# Patient Record
Sex: Male | Born: 1955 | Race: Black or African American | Hispanic: No | Smoking: Former smoker
Health system: Southern US, Community
[De-identification: ages and names within clinical notes are randomized; demographics above are authoritative.]

## PROBLEM LIST (undated history)

## (undated) DIAGNOSIS — J449 Chronic obstructive pulmonary disease, unspecified: Secondary | ICD-10-CM

## (undated) DIAGNOSIS — I5030 Unspecified diastolic (congestive) heart failure: Secondary | ICD-10-CM

## (undated) DIAGNOSIS — I10 Essential (primary) hypertension: Secondary | ICD-10-CM

## (undated) DIAGNOSIS — I714 Abdominal aortic aneurysm, without rupture, unspecified: Secondary | ICD-10-CM

## (undated) DIAGNOSIS — I519 Heart disease, unspecified: Secondary | ICD-10-CM

## (undated) DIAGNOSIS — J80 Acute respiratory distress syndrome: Secondary | ICD-10-CM

## (undated) DIAGNOSIS — R0902 Hypoxemia: Secondary | ICD-10-CM

## (undated) DIAGNOSIS — G4730 Sleep apnea, unspecified: Secondary | ICD-10-CM

## (undated) DIAGNOSIS — N1831 Chronic kidney disease, stage 3a: Secondary | ICD-10-CM

## (undated) DIAGNOSIS — U071 COVID-19: Secondary | ICD-10-CM

## (undated) DIAGNOSIS — I509 Heart failure, unspecified: Secondary | ICD-10-CM

## (undated) DIAGNOSIS — K259 Gastric ulcer, unspecified as acute or chronic, without hemorrhage or perforation: Secondary | ICD-10-CM

## (undated) DIAGNOSIS — K922 Gastrointestinal hemorrhage, unspecified: Secondary | ICD-10-CM

## (undated) DIAGNOSIS — Z720 Tobacco use: Secondary | ICD-10-CM

## (undated) DIAGNOSIS — Z9289 Personal history of other medical treatment: Secondary | ICD-10-CM

## (undated) DIAGNOSIS — D509 Iron deficiency anemia, unspecified: Secondary | ICD-10-CM

## (undated) HISTORY — DX: Heart disease, unspecified: I51.9

## (undated) HISTORY — PX: PARTIAL COLECTOMY: SHX5273

## (undated) HISTORY — DX: Personal history of other medical treatment: Z92.89

## (undated) HISTORY — DX: Heart failure, unspecified: I50.9

## (undated) HISTORY — DX: Sleep apnea, unspecified: G47.30

---

## 2003-02-17 ENCOUNTER — Other Ambulatory Visit: Payer: Self-pay

## 2006-03-13 ENCOUNTER — Emergency Department: Payer: Self-pay | Admitting: Unknown Physician Specialty

## 2012-08-21 ENCOUNTER — Emergency Department: Payer: Self-pay | Admitting: Unknown Physician Specialty

## 2012-08-21 LAB — CBC
HCT: 42.9 % (ref 40.0–52.0)
HGB: 13.5 g/dL (ref 13.0–18.0)
MCH: 22.9 pg — ABNORMAL LOW (ref 26.0–34.0)
MCHC: 31.6 g/dL — ABNORMAL LOW (ref 32.0–36.0)
MCV: 73 fL — ABNORMAL LOW (ref 80–100)
Platelet: 147 10*3/uL — ABNORMAL LOW (ref 150–440)
RBC: 5.91 10*6/uL — ABNORMAL HIGH (ref 4.40–5.90)
RDW: 15.8 % — ABNORMAL HIGH (ref 11.5–14.5)
WBC: 9.1 10*3/uL (ref 3.8–10.6)

## 2012-08-21 LAB — COMPREHENSIVE METABOLIC PANEL
Albumin: 3 g/dL — ABNORMAL LOW (ref 3.4–5.0)
Alkaline Phosphatase: 112 U/L (ref 50–136)
Anion Gap: 4 — ABNORMAL LOW (ref 7–16)
BUN: 11 mg/dL (ref 7–18)
Bilirubin,Total: 0.3 mg/dL (ref 0.2–1.0)
Calcium, Total: 8.4 mg/dL — ABNORMAL LOW (ref 8.5–10.1)
Chloride: 108 mmol/L — ABNORMAL HIGH (ref 98–107)
Co2: 26 mmol/L (ref 21–32)
Creatinine: 0.69 mg/dL (ref 0.60–1.30)
EGFR (African American): >60
EGFR (Non-African Amer.): >60
Glucose: 93 mg/dL (ref 65–99)
Osmolality: 275 (ref 275–301)
Potassium: 4 mmol/L (ref 3.5–5.1)
SGOT(AST): 28 U/L (ref 15–37)
SGPT (ALT): 19 U/L (ref 12–78)
Sodium: 138 mmol/L (ref 136–145)
Total Protein: 7.9 g/dL (ref 6.4–8.2)

## 2012-08-21 LAB — URINALYSIS, COMPLETE
Bilirubin,UR: NEGATIVE
Glucose,UR: NEGATIVE mg/dL (ref 0–75)
Ketone: NEGATIVE
Leukocyte Esterase: NEGATIVE
Nitrite: NEGATIVE
Ph: 5 (ref 4.5–8.0)
Protein: NEGATIVE
RBC,UR: <1 /HPF (ref 0–5)
Specific Gravity: 1.021 (ref 1.003–1.030)
Squamous Epithelial: <1
WBC UR: <1 /HPF (ref 0–5)

## 2012-08-21 LAB — TROPONIN I: Troponin-I: <0.02 ng/mL

## 2012-08-21 LAB — GC/CHLAMYDIA PROBE AMP

## 2012-08-21 LAB — LIPASE, BLOOD: Lipase: 179 U/L (ref 73–393)

## 2012-08-21 LAB — MAGNESIUM: Magnesium: 1.9 mg/dL

## 2014-03-27 ENCOUNTER — Inpatient Hospital Stay (HOSPITAL_COMMUNITY)
Admission: EM | Admit: 2014-03-27 | Discharge: 2014-04-02 | DRG: 565 | Disposition: A | Discharge status: Home Health | Payer: No Typology Code available for payment source | Attending: General Surgery | Admitting: General Surgery

## 2014-03-27 ENCOUNTER — Emergency Department (HOSPITAL_COMMUNITY): Payer: No Typology Code available for payment source

## 2014-03-27 ENCOUNTER — Encounter (HOSPITAL_COMMUNITY): Payer: Self-pay

## 2014-03-27 DIAGNOSIS — S2239XA Fracture of one rib, unspecified side, initial encounter for closed fracture: Secondary | ICD-10-CM

## 2014-03-27 DIAGNOSIS — S2249XA Multiple fractures of ribs, unspecified side, initial encounter for closed fracture: Secondary | ICD-10-CM | POA: Diagnosis present

## 2014-03-27 DIAGNOSIS — Z6841 Body Mass Index (BMI) 40.0 and over, adult: Secondary | ICD-10-CM

## 2014-03-27 DIAGNOSIS — S42112A Displaced fracture of body of scapula, left shoulder, initial encounter for closed fracture: Secondary | ICD-10-CM | POA: Diagnosis not present

## 2014-03-27 DIAGNOSIS — I10 Essential (primary) hypertension: Secondary | ICD-10-CM | POA: Diagnosis present

## 2014-03-27 DIAGNOSIS — S27321A Contusion of lung, unilateral, initial encounter: Secondary | ICD-10-CM | POA: Diagnosis present

## 2014-03-27 DIAGNOSIS — G4730 Sleep apnea, unspecified: Secondary | ICD-10-CM | POA: Diagnosis present

## 2014-03-27 DIAGNOSIS — S270XXA Traumatic pneumothorax, initial encounter: Secondary | ICD-10-CM | POA: Diagnosis present

## 2014-03-27 DIAGNOSIS — R0902 Hypoxemia: Secondary | ICD-10-CM | POA: Diagnosis present

## 2014-03-27 DIAGNOSIS — S42102S Fracture of unspecified part of scapula, left shoulder, sequela: Secondary | ICD-10-CM | POA: Diagnosis present

## 2014-03-27 DIAGNOSIS — R451 Restlessness and agitation: Secondary | ICD-10-CM | POA: Diagnosis not present

## 2014-03-27 DIAGNOSIS — S42102A Fracture of unspecified part of scapula, left shoulder, initial encounter for closed fracture: Secondary | ICD-10-CM | POA: Diagnosis present

## 2014-03-27 DIAGNOSIS — F17210 Nicotine dependence, cigarettes, uncomplicated: Secondary | ICD-10-CM | POA: Diagnosis present

## 2014-03-27 DIAGNOSIS — S2242XA Multiple fractures of ribs, left side, initial encounter for closed fracture: Secondary | ICD-10-CM | POA: Diagnosis present

## 2014-03-27 DIAGNOSIS — F1290 Cannabis use, unspecified, uncomplicated: Secondary | ICD-10-CM | POA: Diagnosis present

## 2014-03-27 DIAGNOSIS — M25512 Pain in left shoulder: Secondary | ICD-10-CM | POA: Diagnosis not present

## 2014-03-27 DIAGNOSIS — R079 Chest pain, unspecified: Secondary | ICD-10-CM

## 2014-03-27 HISTORY — DX: Gastric ulcer, unspecified as acute or chronic, without hemorrhage or perforation: K25.9

## 2014-03-27 HISTORY — DX: Essential (primary) hypertension: I10

## 2014-03-27 LAB — BASIC METABOLIC PANEL
Anion gap: 10 (ref 5–15)
BUN: 14 mg/dL (ref 6–23)
CO2: 26 mEq/L (ref 19–32)
Calcium: 8.6 mg/dL (ref 8.4–10.5)
Chloride: 107 mEq/L (ref 96–112)
Creatinine, Ser: 0.81 mg/dL (ref 0.50–1.35)
GFR calc Af Amer: >90 mL/min (ref >90)
GFR calc non Af Amer: >90 mL/min (ref >90)
Glucose, Bld: 139 mg/dL — ABNORMAL HIGH (ref 70–99)
Potassium: 3.7 mEq/L (ref 3.7–5.3)
Sodium: 143 mEq/L (ref 137–147)

## 2014-03-27 LAB — ETHANOL: Alcohol, Ethyl (B): <11 mg/dL (ref 0–11)

## 2014-03-27 LAB — CBC
HCT: 50.1 % (ref 39.0–52.0)
Hemoglobin: 15.3 g/dL (ref 13.0–17.0)
MCH: 23.5 pg — ABNORMAL LOW (ref 26.0–34.0)
MCHC: 30.5 g/dL (ref 30.0–36.0)
MCV: 76.8 fL — ABNORMAL LOW (ref 78.0–100.0)
Platelets: 149 10*3/uL — ABNORMAL LOW (ref 150–400)
RBC: 6.52 MIL/uL — ABNORMAL HIGH (ref 4.22–5.81)
RDW: 15.4 % (ref 11.5–15.5)
WBC: 13.4 10*3/uL — ABNORMAL HIGH (ref 4.0–10.5)

## 2014-03-27 LAB — TYPE AND SCREEN
ABO/RH(D): A NEG
Antibody Screen: NEGATIVE
PT AG Type: NEGATIVE

## 2014-03-27 LAB — I-STAT TROPONIN, ED: Troponin i, poc: 0.01 ng/mL (ref 0.00–0.08)

## 2014-03-27 LAB — PROTIME-INR
INR: 1.12 (ref 0.00–1.49)
Prothrombin Time: 14.5 seconds (ref 11.6–15.2)

## 2014-03-27 MED ORDER — PANTOPRAZOLE SODIUM 40 MG PO TBEC
40.0000 mg | DELAYED_RELEASE_TABLET | Freq: Every day | ORAL | Status: DC
  Administered 2014-03-29 – 2014-03-31 (×3): 40 mg via ORAL
  Filled 2014-03-27 (×3): qty 1

## 2014-03-27 MED ORDER — OXYCODONE HCL 5 MG PO TABS
10.0000 mg | ORAL_TABLET | ORAL | Status: DC | PRN
  Administered 2014-03-29 – 2014-03-30 (×2): 10 mg via ORAL
  Filled 2014-03-27 (×3): qty 2

## 2014-03-27 MED ORDER — ONDANSETRON HCL 4 MG/2ML IJ SOLN
4.0000 mg | Freq: Once | INTRAMUSCULAR | Status: AC
  Administered 2014-03-27: 4 mg via INTRAVENOUS
  Filled 2014-03-27: qty 2

## 2014-03-27 MED ORDER — SODIUM CHLORIDE 0.9 % IV SOLN
INTRAVENOUS | Status: DC
Start: 2014-03-27 — End: 2014-03-31
  Administered 2014-03-27: 19:00:00 via INTRAVENOUS

## 2014-03-27 MED ORDER — ENOXAPARIN SODIUM 40 MG/0.4ML ~~LOC~~ SOLN
40.0000 mg | SUBCUTANEOUS | Status: DC
  Administered 2014-03-28 – 2014-03-31 (×4): 40 mg via SUBCUTANEOUS
  Filled 2014-03-27 (×5): qty 0.4

## 2014-03-27 MED ORDER — HYDROMORPHONE HCL 1 MG/ML IJ SOLN: 1.0000 mg | Freq: Once | INTRAMUSCULAR | Status: DC

## 2014-03-27 MED ORDER — PANTOPRAZOLE SODIUM 40 MG IV SOLR
40.0000 mg | Freq: Every day | INTRAVENOUS | Status: DC
  Administered 2014-03-28: 40 mg via INTRAVENOUS
  Filled 2014-03-27 (×5): qty 40

## 2014-03-27 MED ORDER — BISACODYL 10 MG RE SUPP: 10.0000 mg | Freq: Every day | RECTAL | Status: DC | PRN

## 2014-03-27 MED ORDER — HYDROMORPHONE HCL 1 MG/ML IJ SOLN
1.0000 mg | INTRAMUSCULAR | Status: DC | PRN
  Administered 2014-03-28 (×2): 2 mg via INTRAVENOUS
  Filled 2014-03-27 (×2): qty 2

## 2014-03-27 MED ORDER — DOCUSATE SODIUM 100 MG PO CAPS
100.0000 mg | ORAL_CAPSULE | Freq: Two times a day (BID) | ORAL | Status: DC
  Administered 2014-03-29 – 2014-04-01 (×7): 100 mg via ORAL
  Filled 2014-03-27 (×8): qty 1

## 2014-03-27 MED ORDER — HYDROMORPHONE HCL 1 MG/ML IJ SOLN
1.0000 mg | Freq: Once | INTRAMUSCULAR | Status: AC
  Administered 2014-03-27: 1 mg via INTRAVENOUS
  Filled 2014-03-27: qty 1

## 2014-03-27 MED ORDER — HYDROMORPHONE HCL 1 MG/ML IJ SOLN
2.0000 mg | INTRAMUSCULAR | Status: DC | PRN
  Administered 2014-03-27: 2 mg via INTRAVENOUS
  Filled 2014-03-27: qty 2

## 2014-03-27 MED ORDER — IOHEXOL 300 MG/ML  SOLN
100.0000 mL | Freq: Once | INTRAMUSCULAR | Status: AC | PRN
  Administered 2014-03-27: 100 mL via INTRAVENOUS

## 2014-03-27 MED ORDER — FENTANYL CITRATE 0.05 MG/ML IJ SOLN
50.0000 ug | Freq: Once | INTRAMUSCULAR | Status: AC
  Administered 2014-03-27: 50 ug via INTRAVENOUS
  Filled 2014-03-27: qty 2

## 2014-03-27 MED ORDER — ONDANSETRON HCL 4 MG/2ML IJ SOLN: 4.0000 mg | Freq: Four times a day (QID) | INTRAMUSCULAR | Status: DC | PRN

## 2014-03-27 MED ORDER — ONDANSETRON HCL 4 MG PO TABS: 4.0000 mg | ORAL_TABLET | Freq: Four times a day (QID) | ORAL | Status: DC | PRN
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
## 2014-03-27 NOTE — ED Notes (Signed)
Pt O2 sats still dipping below 90%.  Patient placed on NRB.  Pt sats steady at 93%, sent to CT scans.

## 2014-03-27 NOTE — ED Notes (Signed)
Report attempted x 1

## 2014-03-27 NOTE — ED Notes (Addendum)
Pt refusing to wear non rebreather mask until pt given something to drink. This RN informed pt that spo2 88-89% on RA and that mask was needed to be kept on. Pt threatening to take off mask and get up and rip off heart monitor leads. Pt agitated at this time, RN continued to explain reasoning and offered mouth swab.

## 2014-03-28 ENCOUNTER — Observation Stay (HOSPITAL_COMMUNITY): Payer: No Typology Code available for payment source

## 2014-03-28 LAB — CBC
HCT: 48.2 % (ref 39.0–52.0)
Hemoglobin: 14 g/dL (ref 13.0–17.0)
MCH: 23.2 pg — ABNORMAL LOW (ref 26.0–34.0)
MCHC: 29 g/dL — ABNORMAL LOW (ref 30.0–36.0)
MCV: 79.8 fL (ref 78.0–100.0)
Platelets: 134 10*3/uL — ABNORMAL LOW (ref 150–400)
RBC: 6.04 MIL/uL — ABNORMAL HIGH (ref 4.22–5.81)
RDW: 16.1 % — ABNORMAL HIGH (ref 11.5–15.5)
WBC: 14.7 10*3/uL — ABNORMAL HIGH (ref 4.0–10.5)

## 2014-03-28 LAB — BASIC METABOLIC PANEL
Anion gap: 9 (ref 5–15)
BUN: 15 mg/dL (ref 6–23)
CO2: 27 mEq/L (ref 19–32)
Calcium: 8.5 mg/dL (ref 8.4–10.5)
Chloride: 108 mEq/L (ref 96–112)
Creatinine, Ser: 0.93 mg/dL (ref 0.50–1.35)
GFR calc Af Amer: >90 mL/min (ref >90)
GFR calc non Af Amer: >90 mL/min (ref >90)
Glucose, Bld: 122 mg/dL — ABNORMAL HIGH (ref 70–99)
Potassium: 4.7 mEq/L (ref 3.7–5.3)
Sodium: 144 mEq/L (ref 137–147)

## 2014-03-28 MED ORDER — KETOROLAC TROMETHAMINE 30 MG/ML IJ SOLN
30.0000 mg | Freq: Four times a day (QID) | INTRAMUSCULAR | Status: DC
  Administered 2014-03-28 – 2014-04-01 (×15): 30 mg via INTRAVENOUS
  Filled 2014-03-28 (×29): qty 1

## 2014-03-28 MED ORDER — IPRATROPIUM-ALBUTEROL 0.5-2.5 (3) MG/3ML IN SOLN
3.0000 mL | Freq: Two times a day (BID) | RESPIRATORY_TRACT | Status: DC
  Administered 2014-03-29 (×2): 3 mL via RESPIRATORY_TRACT
  Filled 2014-03-28 (×2): qty 3

## 2014-03-28 MED ORDER — IPRATROPIUM-ALBUTEROL 0.5-2.5 (3) MG/3ML IN SOLN: 3.0000 mL | RESPIRATORY_TRACT | Status: DC | PRN

## 2014-03-28 MED ORDER — MORPHINE SULFATE 2 MG/ML IJ SOLN
2.0000 mg | INTRAMUSCULAR | Status: DC | PRN
  Administered 2014-03-28 (×2): 2 mg via INTRAVENOUS
  Filled 2014-03-28 (×2): qty 1

## 2014-03-28 MED ORDER — IPRATROPIUM-ALBUTEROL 0.5-2.5 (3) MG/3ML IN SOLN
3.0000 mL | RESPIRATORY_TRACT | Status: DC
  Administered 2014-03-28 (×2): 3 mL via RESPIRATORY_TRACT
  Filled 2014-03-28 (×2): qty 3

## 2014-03-28 NOTE — Progress Notes (Signed)
Pt. Was placed on CPAP of 10cm H2O via nasal mask with 12L O2 bled in. Pt. Is tolerating CPAP well at this time.
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
## 2014-03-28 NOTE — Progress Notes (Signed)
Pt. Was placed on CPAP of 10cm H2O via nasal mask with 12L bled in. Pt. Is tolerating CPAP well at this time but is very agitated.

## 2014-03-28 NOTE — Progress Notes (Signed)
Pt pulled of nasal cpap said he cant take it, i explained to patient that he is desaturating when he goes to sleep pt still refused to wear placed pt on 6 liters nasal canula. Pt did IS at 750 will continue to monitor

## 2014-03-28 NOTE — Progress Notes (Signed)
Utilization Review Completed.   Suprena Travaglini, RN, BSN Nurse Case Manager  

## 2014-03-28 NOTE — Progress Notes (Signed)
Pt had been tolerating WELL ON NASAL CPAP he became agitated and pulled off cpap and broke strap was spitting at staff placed back on 100% NRB will page RT and get new strap
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
## 2014-03-28 NOTE — Progress Notes (Signed)
Pt more aware this am, however still restless, attempted I/S, Pt unable to use effectively at this time; will try later in the day.

## 2014-03-29 DIAGNOSIS — R0902 Hypoxemia: Secondary | ICD-10-CM | POA: Diagnosis present

## 2014-03-29 DIAGNOSIS — Z6841 Body Mass Index (BMI) 40.0 and over, adult: Secondary | ICD-10-CM | POA: Diagnosis not present

## 2014-03-29 DIAGNOSIS — G4730 Sleep apnea, unspecified: Secondary | ICD-10-CM | POA: Diagnosis present

## 2014-03-29 DIAGNOSIS — I10 Essential (primary) hypertension: Secondary | ICD-10-CM | POA: Diagnosis present

## 2014-03-29 DIAGNOSIS — M25512 Pain in left shoulder: Secondary | ICD-10-CM | POA: Diagnosis present

## 2014-03-29 DIAGNOSIS — F1290 Cannabis use, unspecified, uncomplicated: Secondary | ICD-10-CM | POA: Diagnosis present

## 2014-03-29 DIAGNOSIS — S270XXA Traumatic pneumothorax, initial encounter: Secondary | ICD-10-CM | POA: Diagnosis present

## 2014-03-29 DIAGNOSIS — S42112A Displaced fracture of body of scapula, left shoulder, initial encounter for closed fracture: Secondary | ICD-10-CM | POA: Diagnosis present

## 2014-03-29 DIAGNOSIS — F17210 Nicotine dependence, cigarettes, uncomplicated: Secondary | ICD-10-CM | POA: Diagnosis present

## 2014-03-29 DIAGNOSIS — R451 Restlessness and agitation: Secondary | ICD-10-CM | POA: Diagnosis not present

## 2014-03-29 DIAGNOSIS — S27321A Contusion of lung, unilateral, initial encounter: Secondary | ICD-10-CM | POA: Diagnosis present

## 2014-03-29 DIAGNOSIS — S2242XA Multiple fractures of ribs, left side, initial encounter for closed fracture: Secondary | ICD-10-CM | POA: Diagnosis present

## 2014-03-29 LAB — CDS SEROLOGY

## 2014-03-29 MED ORDER — TRAMADOL HCL 50 MG PO TABS
50.0000 mg | ORAL_TABLET | Freq: Four times a day (QID) | ORAL | Status: DC
  Administered 2014-03-29 – 2014-04-01 (×10): 50 mg via ORAL
  Filled 2014-03-29 (×10): qty 1

## 2014-03-29 NOTE — Progress Notes (Signed)
UR completed 
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
## 2014-03-29 NOTE — Progress Notes (Signed)
Pt continues to have periods of sleep apnea, his saturations drop from 96 to mid 70"s but quickly returns to low to mid 90's.  Pt continues  To refuse hHis Cpap is getting 750 on his IS and coughing up copious tan to  Blood tinged secretions. Will continue to monitor
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
## 2014-03-30 ENCOUNTER — Inpatient Hospital Stay (HOSPITAL_COMMUNITY): Payer: No Typology Code available for payment source

## 2014-03-30 MED ORDER — METOPROLOL TARTRATE 1 MG/ML IV SOLN
5.0000 mg | Freq: Four times a day (QID) | INTRAVENOUS | Status: DC | PRN
  Administered 2014-03-30 – 2014-03-31 (×3): 5 mg via INTRAVENOUS
  Filled 2014-03-30 (×4): qty 5
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
## 2014-03-30 NOTE — Progress Notes (Signed)
Patient does not want to go on the CPAP machine at this time. Patient is aware to call RT if he needs help going on the CPAP later. RT will continue to assist as needed.
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
## 2014-03-31 ENCOUNTER — Inpatient Hospital Stay (HOSPITAL_COMMUNITY): Payer: No Typology Code available for payment source

## 2014-03-31 MED ORDER — HYDROCHLOROTHIAZIDE 12.5 MG PO CAPS
12.5000 mg | ORAL_CAPSULE | Freq: Every day | ORAL | Status: DC
  Administered 2014-03-31 – 2014-04-01 (×2): 12.5 mg via ORAL
  Filled 2014-03-31 (×3): qty 1

## 2014-03-31 MED ORDER — SODIUM CHLORIDE 0.9 % IJ SOLN
3.0000 mL | INTRAMUSCULAR | Status: DC | PRN
Start: 2014-03-31 — End: 2014-04-02
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
## 2014-03-31 NOTE — Progress Notes (Signed)
Patient to transfer to 6N10 report given to receiving nurse Baxter Flattery all questions answered at this time.  Patient stable for transfer.
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
## 2014-03-31 NOTE — Progress Notes (Signed)
Patient transferred from 3S. Report received from [place], [place]. Patient stable.

## 2014-04-01 DIAGNOSIS — I10 Essential (primary) hypertension: Secondary | ICD-10-CM | POA: Insufficient documentation

## 2014-04-01 DIAGNOSIS — I959 Hypotension, unspecified: Secondary | ICD-10-CM | POA: Insufficient documentation

## 2014-04-01 DIAGNOSIS — R0902 Hypoxemia: Secondary | ICD-10-CM | POA: Diagnosis present

## 2014-04-01 DIAGNOSIS — S42102A Fracture of unspecified part of scapula, left shoulder, initial encounter for closed fracture: Secondary | ICD-10-CM | POA: Diagnosis present

## 2014-04-01 DIAGNOSIS — S42102S Fracture of unspecified part of scapula, left shoulder, sequela: Secondary | ICD-10-CM | POA: Diagnosis present

## 2014-04-01 MED ORDER — ENOXAPARIN SODIUM 40 MG/0.4ML ~~LOC~~ SOLN
40.0000 mg | Freq: Two times a day (BID) | SUBCUTANEOUS | Status: DC
  Administered 2014-04-01 (×2): 40 mg via SUBCUTANEOUS
  Filled 2014-04-01 (×3): qty 0.4

## 2014-04-01 MED ORDER — NAPROXEN 500 MG PO TABS
500.0000 mg | ORAL_TABLET | Freq: Two times a day (BID) | ORAL | Status: DC
  Administered 2014-04-01 – 2014-04-02 (×2): 500 mg via ORAL
  Filled 2014-04-01 (×4): qty 1
  Filled 2014-04-01: qty 2

## 2014-04-01 MED ORDER — OXYCODONE HCL 5 MG PO TABS
10.0000 mg | ORAL_TABLET | ORAL | Status: DC | PRN
  Administered 2014-04-01 – 2014-04-02 (×5): 20 mg via ORAL
  Filled 2014-04-01 (×5): qty 4

## 2014-04-01 MED ORDER — MORPHINE SULFATE 4 MG/ML IJ SOLN
4.0000 mg | INTRAMUSCULAR | Status: DC | PRN
Start: 2014-04-01 — End: 2014-04-02

## 2014-04-01 NOTE — Progress Notes (Signed)
Pt is refusing CPAP tonight.  Reminded Pt of importance to wear and he states he understands.  Notified Pt to have [place] called should he change his mind.
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
## 2014-04-01 NOTE — Progress Notes (Signed)
UR completed.  Pt is uninsured with recommendations for [facility].  Checking to see if this is approved according to Medicaid guidelines which are what is used in these cases by the [facility] agency.  Anticipate answer in the am.   Sandi Mariscal, RN BSN [place] Trauma/Neuro ICU Case Manager [phone]
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
## 2014-04-02 MED ORDER — NAPROXEN 500 MG PO TABS: 500.0000 mg | ORAL_TABLET | Freq: Two times a day (BID) | ORAL | Status: DC

## 2014-04-02 MED ORDER — OXYCODONE-ACETAMINOPHEN 10-325 MG PO TABS: 1.0000 | ORAL_TABLET | ORAL | Status: DC | PRN
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
## 2014-04-02 NOTE — Progress Notes (Addendum)
Pt planned for discharge home today.  Home address is [address], [place], [place].  Home phone is [phone].  Mobile number is [phone].  Pt is uninsured and will not qualify under Medicaid standards for HHPT.  Has orders for 3-in-1 and rolling walker.  Will need bariatric size given weight > 350 lbs.  Those orders were called into [facility] [employer] rep in hospital, Pilar Plate, at [phone].    Pt needs appointment to free clinic and lives in [place].  [facility] has faxed me an application for the patient to complete.   Pt enrolled in [facility] program for medication assistance.  Provided with letter, list of participating pharmacies and an explanation of the process. Pt/caregiver stated understanding.  Sandi Mariscal, RN BSN [facility] CCM  Case Manager, Trauma Service/Unit 13M [phone]

## 2014-04-02 NOTE — Discharge Instructions (Signed)
No driving while taking oxycodone. °

## 2014-04-02 NOTE — Progress Notes (Signed)
PT Cancellation Note  Patient Details Name: Gary [name] MRN: [id number] DOB: Jun 15, 1955   Cancelled Treatment:    Reason Eval/Treat Not Completed: Other (comment). Patient becoming very upset and rude with therapist assistant when I asked if he would be willing to walk with therapy. Patient procrastinating and beginning to curse with his wife in the room and they began arguing. At this time patient refusing PT.    [name] 04/02/2014, 9:20 AM

## 2014-04-02 NOTE — Progress Notes (Signed)
Patient discharged to home with instructions. 

## 2014-04-15 ENCOUNTER — Emergency Department: Payer: Self-pay | Admitting: Emergency Medicine

## 2014-04-15 ENCOUNTER — Telehealth (HOSPITAL_COMMUNITY): Payer: Self-pay

## 2014-04-15 LAB — CBC
HCT: 44.6 % (ref 40.0–52.0)
HGB: 13.5 g/dL (ref 13.0–18.0)
MCH: 22.7 pg — ABNORMAL LOW (ref 26.0–34.0)
MCHC: 30.3 g/dL — ABNORMAL LOW (ref 32.0–36.0)
MCV: 75 fL — ABNORMAL LOW (ref 80–100)
Platelet: 163 10*3/uL (ref 150–440)
RBC: 5.96 10*6/uL — ABNORMAL HIGH (ref 4.40–5.90)
RDW: 15.8 % — ABNORMAL HIGH (ref 11.5–14.5)
WBC: 8.3 10*3/uL (ref 3.8–10.6)

## 2014-04-15 LAB — URINALYSIS, COMPLETE
Bacteria: NONE SEEN
Bilirubin,UR: NEGATIVE
Blood: NEGATIVE
Glucose,UR: NEGATIVE mg/dL (ref 0–75)
Ketone: NEGATIVE
Leukocyte Esterase: NEGATIVE
Nitrite: NEGATIVE
Ph: 7 (ref 4.5–8.0)
Protein: NEGATIVE
RBC,UR: NONE SEEN /HPF (ref 0–5)
Specific Gravity: 1.013 (ref 1.003–1.030)
Squamous Epithelial: <1
WBC UR: <1 /HPF (ref 0–5)

## 2014-04-15 LAB — COMPREHENSIVE METABOLIC PANEL
Albumin: 2.9 g/dL — ABNORMAL LOW (ref 3.4–5.0)
Alkaline Phosphatase: 173 U/L — ABNORMAL HIGH
Anion Gap: 3 — ABNORMAL LOW (ref 7–16)
BUN: 10 mg/dL (ref 7–18)
Bilirubin,Total: 0.4 mg/dL (ref 0.2–1.0)
Calcium, Total: 8.3 mg/dL — ABNORMAL LOW (ref 8.5–10.1)
Chloride: 107 mmol/L (ref 98–107)
Co2: 30 mmol/L (ref 21–32)
Creatinine: 0.88 mg/dL (ref 0.60–1.30)
EGFR (African American): >60
EGFR (Non-African Amer.): >60
Glucose: 105 mg/dL — ABNORMAL HIGH (ref 65–99)
Osmolality: 279 (ref 275–301)
Potassium: 3.9 mmol/L (ref 3.5–5.1)
SGOT(AST): 15 U/L (ref 15–37)
SGPT (ALT): 15 U/L
Sodium: 140 mmol/L (ref 136–145)
Total Protein: 7.3 g/dL (ref 6.4–8.2)

## 2014-04-19 ENCOUNTER — Telehealth (HOSPITAL_COMMUNITY): Payer: Self-pay
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
## 2015-01-10 ENCOUNTER — Encounter: Payer: Self-pay | Admitting: Emergency Medicine

## 2015-01-10 ENCOUNTER — Inpatient Hospital Stay
Admission: EM | Admit: 2015-01-10 | Discharge: 2015-01-14 | DRG: 291 | Disposition: A | Discharge status: Home / Self Care | Payer: Self-pay | Attending: Internal Medicine | Admitting: Internal Medicine

## 2015-01-10 ENCOUNTER — Emergency Department: Payer: Self-pay

## 2015-01-10 DIAGNOSIS — G4733 Obstructive sleep apnea (adult) (pediatric): Secondary | ICD-10-CM | POA: Diagnosis present

## 2015-01-10 DIAGNOSIS — I5033 Acute on chronic diastolic (congestive) heart failure: Principal | ICD-10-CM | POA: Insufficient documentation

## 2015-01-10 DIAGNOSIS — Z9049 Acquired absence of other specified parts of digestive tract: Secondary | ICD-10-CM | POA: Diagnosis present

## 2015-01-10 DIAGNOSIS — I110 Hypertensive heart disease with heart failure: Secondary | ICD-10-CM | POA: Diagnosis present

## 2015-01-10 DIAGNOSIS — Z9119 Patient's noncompliance with other medical treatment and regimen: Secondary | ICD-10-CM | POA: Diagnosis not present

## 2015-01-10 DIAGNOSIS — J9621 Acute and chronic respiratory failure with hypoxia: Secondary | ICD-10-CM | POA: Insufficient documentation

## 2015-01-10 DIAGNOSIS — D569 Thalassemia, unspecified: Secondary | ICD-10-CM | POA: Diagnosis present

## 2015-01-10 DIAGNOSIS — R600 Localized edema: Secondary | ICD-10-CM | POA: Insufficient documentation

## 2015-01-10 DIAGNOSIS — F17200 Nicotine dependence, unspecified, uncomplicated: Secondary | ICD-10-CM | POA: Insufficient documentation

## 2015-01-10 DIAGNOSIS — I5031 Acute diastolic (congestive) heart failure: Secondary | ICD-10-CM

## 2015-01-10 DIAGNOSIS — R931 Abnormal findings on diagnostic imaging of heart and coronary circulation: Secondary | ICD-10-CM | POA: Insufficient documentation

## 2015-01-10 DIAGNOSIS — E875 Hyperkalemia: Secondary | ICD-10-CM | POA: Diagnosis present

## 2015-01-10 DIAGNOSIS — F1210 Cannabis abuse, uncomplicated: Secondary | ICD-10-CM | POA: Diagnosis present

## 2015-01-10 DIAGNOSIS — Z7982 Long term (current) use of aspirin: Secondary | ICD-10-CM

## 2015-01-10 DIAGNOSIS — R0602 Shortness of breath: Secondary | ICD-10-CM | POA: Insufficient documentation

## 2015-01-10 DIAGNOSIS — F17210 Nicotine dependence, cigarettes, uncomplicated: Secondary | ICD-10-CM | POA: Diagnosis present

## 2015-01-10 DIAGNOSIS — F1910 Other psychoactive substance abuse, uncomplicated: Secondary | ICD-10-CM | POA: Insufficient documentation

## 2015-01-10 DIAGNOSIS — F1410 Cocaine abuse, uncomplicated: Secondary | ICD-10-CM | POA: Diagnosis present

## 2015-01-10 DIAGNOSIS — Z6841 Body Mass Index (BMI) 40.0 and over, adult: Secondary | ICD-10-CM

## 2015-01-10 DIAGNOSIS — J9601 Acute respiratory failure with hypoxia: Secondary | ICD-10-CM | POA: Diagnosis present

## 2015-01-10 DIAGNOSIS — I509 Heart failure, unspecified: Secondary | ICD-10-CM

## 2015-01-10 HISTORY — DX: Gastrointestinal hemorrhage, unspecified: K92.2

## 2015-01-10 HISTORY — DX: Morbid (severe) obesity due to excess calories: E66.01

## 2015-01-10 HISTORY — DX: Hypoxemia: R09.02

## 2015-01-10 LAB — BASIC METABOLIC PANEL
Anion gap: 10 (ref 5–15)
BUN: 11 mg/dL (ref 6–20)
CO2: 26 mmol/L (ref 22–32)
Calcium: 8.2 mg/dL — ABNORMAL LOW (ref 8.9–10.3)
Chloride: 107 mmol/L (ref 101–111)
Creatinine, Ser: 0.88 mg/dL (ref 0.61–1.24)
GFR calc Af Amer: >60 mL/min (ref >60)
GFR calc non Af Amer: >60 mL/min (ref >60)
Glucose, Bld: 90 mg/dL (ref 65–99)
Potassium: 5.5 mmol/L — ABNORMAL HIGH (ref 3.5–5.1)
Sodium: 143 mmol/L (ref 135–145)

## 2015-01-10 LAB — CBC
HCT: 47 % (ref 39.0–52.0)
Hemoglobin: 13.9 g/dL (ref 13.0–17.0)
MCH: 21.9 pg — ABNORMAL LOW (ref 26.0–34.0)
MCHC: 29.5 g/dL — ABNORMAL LOW (ref 32.0–36.0)
MCV: 74.1 fL — ABNORMAL LOW (ref 80.0–100.0)
Platelets: 129 10*3/uL — ABNORMAL LOW (ref 150–440)
RBC: 6.35 MIL/uL — ABNORMAL HIGH (ref 4.40–5.90)
RDW: 17.1 % — ABNORMAL HIGH (ref 11.5–14.5)
WBC: 6.8 10*3/uL (ref 3.8–10.6)

## 2015-01-10 LAB — TROPONIN I: Troponin I: <0.03 ng/mL (ref <0.031)

## 2015-01-10 MED ORDER — HYDRALAZINE HCL 20 MG/ML IJ SOLN: 10.0000 mg | Freq: Four times a day (QID) | INTRAMUSCULAR | Status: DC | PRN

## 2015-01-10 MED ORDER — HEPARIN SODIUM (PORCINE) 5000 UNIT/ML IJ SOLN
5000.0000 [IU] | Freq: Three times a day (TID) | INTRAMUSCULAR | Status: DC
  Administered 2015-01-10 – 2015-01-11 (×2): 5000 [IU] via SUBCUTANEOUS
  Filled 2015-01-10 (×2): qty 1

## 2015-01-10 MED ORDER — SODIUM CHLORIDE 0.9 % IJ SOLN
3.0000 mL | Freq: Two times a day (BID) | INTRAMUSCULAR | Status: DC
  Administered 2015-01-10 – 2015-01-14 (×8): 3 mL via INTRAVENOUS

## 2015-01-10 MED ORDER — ACETAMINOPHEN 325 MG PO TABS
650.0000 mg | ORAL_TABLET | Freq: Four times a day (QID) | ORAL | Status: DC | PRN
  Administered 2015-01-11: 650 mg via ORAL
  Filled 2015-01-10: qty 2

## 2015-01-10 MED ORDER — NITROGLYCERIN 2 % TD OINT
1.0000 [in_us] | TOPICAL_OINTMENT | Freq: Once | TRANSDERMAL | Status: AC
  Administered 2015-01-10: 1 [in_us] via TOPICAL
  Filled 2015-01-10: qty 1

## 2015-01-10 MED ORDER — FUROSEMIDE 10 MG/ML IJ SOLN
40.0000 mg | Freq: Once | INTRAMUSCULAR | Status: AC
  Administered 2015-01-10: 40 mg via INTRAVENOUS
  Filled 2015-01-10: qty 4

## 2015-01-10 MED ORDER — IPRATROPIUM-ALBUTEROL 0.5-2.5 (3) MG/3ML IN SOLN
3.0000 mL | Freq: Once | RESPIRATORY_TRACT | Status: AC
  Administered 2015-01-10: 3 mL via RESPIRATORY_TRACT
  Filled 2015-01-10: qty 3

## 2015-01-10 MED ORDER — ASPIRIN 81 MG PO CHEW
81.0000 mg | CHEWABLE_TABLET | Freq: Every day | ORAL | Status: DC
  Administered 2015-01-11 – 2015-01-14 (×4): 81 mg via ORAL
  Filled 2015-01-10 (×4): qty 1

## 2015-01-10 MED ORDER — ACETAMINOPHEN 650 MG RE SUPP: 650.0000 mg | Freq: Four times a day (QID) | RECTAL | Status: DC | PRN

## 2015-01-10 MED ORDER — ONDANSETRON HCL 4 MG PO TABS: 4.0000 mg | ORAL_TABLET | Freq: Four times a day (QID) | ORAL | Status: DC | PRN

## 2015-01-10 MED ORDER — NICOTINE 14 MG/24HR TD PT24
14.0000 mg | MEDICATED_PATCH | Freq: Every day | TRANSDERMAL | Status: DC
  Administered 2015-01-11 – 2015-01-14 (×4): 14 mg via TRANSDERMAL
  Filled 2015-01-10 (×4): qty 1

## 2015-01-10 MED ORDER — FUROSEMIDE 10 MG/ML IJ SOLN
40.0000 mg | Freq: Two times a day (BID) | INTRAMUSCULAR | Status: DC
  Administered 2015-01-11 – 2015-01-14 (×7): 40 mg via INTRAVENOUS
  Filled 2015-01-10 (×8): qty 4

## 2015-01-10 MED ORDER — ONDANSETRON HCL 4 MG/2ML IJ SOLN: 4.0000 mg | Freq: Four times a day (QID) | INTRAMUSCULAR | Status: DC | PRN

## 2015-01-10 NOTE — H&P (Signed)
[facility] [place] NAME: Gary [name]    MR#:  [id number]  DATE OF BIRTH:  1955-07-04  DATE OF ADMISSION:  01/10/2015  PRIMARY CARE PHYSICIAN: No primary care provider on file.   REQUESTING/REFERRING PHYSICIAN: Dr. Joni Fears  CHIEF COMPLAINT:   Chief Complaint  Patient presents with  . Leg Swelling    HISTORY OF PRESENT ILLNESS:  Gary [name]  is a 59 y.o. male with a known history of gastric ulcers and hypertension not currently on any antihypertensives medications due to finances presents today with 2 weeks of lower extremity swelling, shortness of breath and increasing abdominal girth. On further interview he does report that a week ago he had 2-3 days of chest pressure and malaise. He did not seek treatment because he needed to go to work. He has not had any nausea or vomiting. He reports worsening dyspnea and significant diaphoresis with exertion. On emergency room evaluation he is hypertensive with blood pressures as high as 180/99, hypoxic with saturation of 86% on room air, chest x-ray shows cardiac enlargement and mild pulmonary edema suggestive of congestive heart failure.  PAST MEDICAL HISTORY:   Past Medical History  Diagnosis Date  . Hypertension   . Multiple gastric ulcers     PAST SURGICAL HISTORY:  History reviewed. No pertinent past surgical history.  SOCIAL HISTORY:   Social History  Substance Use Topics  . Smoking status: Current Every Day Smoker    Types: Cigarettes  . Smokeless tobacco: Not on file  . Alcohol Use: Yes     Comment: rarely    FAMILY HISTORY:   Family History  Problem Relation Age of Onset  . Diabetes Mother   . Stroke Mother   . Stroke Father   . Diabetes Brother   . Stroke Brother     DRUG ALLERGIES:  No Known Allergies  REVIEW OF SYSTEMS:   Review of Systems  Constitutional: Positive for malaise/fatigue and diaphoresis. Negative for fever, chills and weight loss.   HENT: Negative for congestion and hearing loss.   Eyes: Negative for blurred vision and pain.  Respiratory: Positive for shortness of breath and wheezing. Negative for cough, hemoptysis, sputum production and stridor.   Cardiovascular: Positive for chest pain, palpitations, orthopnea and leg swelling. Negative for PND.  Gastrointestinal: Negative for nausea, vomiting, abdominal pain, diarrhea, constipation and blood in stool.  Genitourinary: Negative for dysuria and frequency.  Musculoskeletal: Negative for myalgias, back pain, joint pain and neck pain.  Skin: Negative for rash.  Neurological: Positive for weakness. Negative for focal weakness, loss of consciousness and headaches.  Endo/Heme/Allergies: Does not bruise/bleed easily.  Psychiatric/Behavioral: Negative for depression and hallucinations. The patient is not nervous/anxious.     MEDICATIONS AT HOME:   Prior to Admission medications   Not on File      VITAL SIGNS:  Blood pressure 151/95, pulse 73, temperature 98.2 F (36.8 C), temperature source Oral, resp. rate 16, height 6\' 3"  (1.905 m), weight 178.8 kg (394 lb 2.9 oz), SpO2 97 %.  PHYSICAL EXAMINATION:  GENERAL:  59-year-old patient lying in the bed, uncomfortable in mild respiratory distress, obese EYES: Pupils equal, round, reactive to light and accommodation. No scleral icterus. Extraocular muscles intact.  HEENT: Head atraumatic, normocephalic. Oropharynx and nasopharynx clear. Mucous membranes are dry NECK:  Supple, no jugular venous distention. No thyroid enlargement, no tenderness.  LUNGS: Short shallow respirations, diffuse crackles over all lung fields, fair air movement CARDIOVASCULAR: Distant  S1, S2 normal. No murmurs, rubs, or gallops.  ABDOMEN: Soft, nontender, nondistended. Bowel sounds present. No organomegaly or mass. Guarding no rebound EXTREMITIES: 2+ pedal edema bilaterally, also with edema of the abdomen, no cyanosis, or clubbing.  NEUROLOGIC:  Cranial nerves II through XII are intact. Muscle strength 5/5 in all extremities. Sensation intact. PSYCHIATRIC: The patient is alert and oriented x 3.  SKIN: No obvious rash, lesion, or ulcer.   LABORATORY PANEL:   CBC  Recent Labs Lab 01/10/15 1750  WBC 6.8  HGB 13.9  HCT 47.0  PLT 129*   ------------------------------------------------------------------------------------------------------------------  Chemistries   Recent Labs Lab 01/10/15 1603  NA 143  K 5.5*  CL 107  CO2 26  GLUCOSE 90  BUN 11  CREATININE 0.88  CALCIUM 8.2*   ------------------------------------------------------------------------------------------------------------------  Cardiac Enzymes  Recent Labs Lab 01/10/15 1603  TROPONINI <0.03   ------------------------------------------------------------------------------------------------------------------  RADIOLOGY:  Dg Chest 2 View  01/10/2015   CLINICAL DATA:  Complains of swelling in feet and ankles for 2 weeks  EXAM: CHEST  2 VIEW  COMPARISON:  03/31/2014  FINDINGS: There is moderate cardiac enlargement. There is pulmonary vascular congestion and mild edema. No pleural effusions identified. No airspace consolidation.  IMPRESSION: 1. Cardiac enlargement and mild edema consistent with CHF.   Electronically Signed   By: Kerby Moors M.D.   On: 01/10/2015 15:13   US Venous Img Lower Bilateral  01/10/2015   CLINICAL DATA:  Bilateral lower extremity edema 2 weeks.  EXAM: BILATERAL LOWER EXTREMITY VENOUS DOPPLER ULTRASOUND  TECHNIQUE: Gray-scale sonography with graded compression, as well as color Doppler and duplex ultrasound were performed to evaluate the lower extremity deep venous systems from the level of the common femoral vein and including the common femoral, femoral, profunda femoral, popliteal and calf veins including the posterior tibial, peroneal and gastrocnemius veins when visible. The superficial great saphenous vein was also  interrogated. Spectral Doppler was utilized to evaluate flow at rest and with distal augmentation maneuvers in the common femoral, femoral and popliteal veins.  COMPARISON:  None.  FINDINGS: Exam difficult due to patient body habitus.  RIGHT LOWER EXTREMITY  Common Femoral Vein: No evidence of thrombus. Normal compressibility, respiratory phasicity and response to augmentation.  Saphenofemoral Junction: No evidence of thrombus. Normal compressibility and flow on color Doppler imaging.  Profunda Femoral Vein: No evidence of thrombus. Normal compressibility and flow on color Doppler imaging.  Femoral Vein: No evidence of thrombus. Normal compressibility, respiratory phasicity and response to augmentation.  Popliteal Vein: No evidence of thrombus. Normal compressibility, respiratory phasicity and response to augmentation.  Calf Veins: No evidence of thrombus. Normal compressibility and flow on color Doppler imaging. Perineal veins not well visualized.  Superficial Great Saphenous Vein: No evidence of thrombus. Normal compressibility and flow on color Doppler imaging.  Venous Reflux:  None.  Other Findings:  None.  LEFT LOWER EXTREMITY  Common Femoral Vein: No evidence of thrombus. Normal compressibility, respiratory phasicity and response to augmentation.  Saphenofemoral Junction: No evidence of thrombus. Normal compressibility and flow on color Doppler imaging.  Profunda Femoral Vein: No evidence of thrombus. Normal compressibility and flow on color Doppler imaging.  Femoral Vein: No evidence of thrombus. Normal compressibility, respiratory phasicity and response to augmentation.  Popliteal Vein: No evidence of thrombus. Normal compressibility, respiratory phasicity and response to augmentation.  Calf Veins: Not visualized.  Superficial Great Saphenous Vein: No evidence of thrombus. Normal compressibility and flow on color Doppler imaging.  Venous Reflux:  None.  Other  Findings:  None.  IMPRESSION: No evidence of deep  venous thrombosis.   Electronically Signed   By: Marin Olp M.D.   On: 01/10/2015 15:54    EKG:   Orders placed or performed during the hospital encounter of 01/10/15  . ED EKG within 10 minutes  . ED EKG within 10 minutes    IMPRESSION AND PLAN:   #1 pulmonary edema and lower extremity edema likely due to congestive heart failure: This would be a new diagnosis for this patient. I have ordered a 2-D echocardiogram. Will observe on telemetry. Will check daily weights, I's and O's and started low sodium diet. We'll continue with Lasix as his renal function is excellent and he seems to be diuresing well. If he is found to have congestive heart failure he will need an ischemic workup which could be done during this admission. Will also check a TSH and UDS  #2 chest pain: No significant T-wave changes on EKG. Continue to monitor on telemetry. Continue to cycle troponins, first is negative. Start aspirin and low-dose beta blocker. Check lipids in the morning, check A1c  #3 hypertension: Blood pressure coming down with Lasix. Continue this for now. Provide when necessary hydralazine  #4 tobacco abuse: Tobacco cessation counseling provided. Need for 5-10 minutes. This may also be contributing to his shortness of breath. Nicotine patch  #5 hyperkalemia: No concerning changes on EKG. Monitor on telemetry. This should decrease with Lasix administration. 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  Greater than 50% of time spent in coordination of care and counseling. Care plan discussed with emergency room physician and also with the patient at the bedside.  Myrtis Ser M.D on 01/10/2015 at 6:21 PM  Between 7am to 6pm - Pager - [phone]  After 6pm go to [email or web address] - password  [facility]  [facility]  Office  [phone]  CC: Primary care physician; No primary care provider on file.

## 2015-01-10 NOTE — ED Notes (Signed)
CBC clotted, lab cancelled. Re-draw patient lab work

## 2015-01-10 NOTE — ED Notes (Signed)
Report called to [facility]. Patient to be transported to 2A room 234 with monitor.

## 2015-01-10 NOTE — ED Notes (Signed)
Pt to ed with c/o swelling in feet and ankles x 2 weeks.  States he is supposed to be on HCTZ but has not taken for over 1 year.  Pt sats at triage are 81%.  Pt states he feels mildly sob.  Pt is able to speak in complete sentences.

## 2015-01-10 NOTE — ED Notes (Signed)
Return from ultrasound and xray
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
## 2015-01-11 ENCOUNTER — Inpatient Hospital Stay
Admit: 2015-01-11 | Discharge: 2015-01-11 | Disposition: A | Discharge status: Home / Self Care | Payer: Self-pay | Attending: Internal Medicine | Admitting: Internal Medicine

## 2015-01-11 LAB — URINE DRUG SCREEN, QUALITATIVE (ARMC ONLY)
Amphetamines, Ur Screen: NOT DETECTED
Barbiturates, Ur Screen: NOT DETECTED
Benzodiazepine, Ur Scrn: NOT DETECTED
Cannabinoid 50 Ng, Ur ~~LOC~~: NOT DETECTED
Cocaine Metabolite,Ur ~~LOC~~: NOT DETECTED
MDMA (Ecstasy)Ur Screen: NOT DETECTED
Methadone Scn, Ur: NOT DETECTED
Opiate, Ur Screen: NOT DETECTED
Phencyclidine (PCP) Ur S: NOT DETECTED
Tricyclic, Ur Screen: NOT DETECTED

## 2015-01-11 LAB — TROPONIN I
Troponin I: <0.03 ng/mL (ref <0.031)
Troponin I: <0.03 ng/mL (ref <0.031)

## 2015-01-11 LAB — LIPID PANEL
Cholesterol: 124 mg/dL (ref 0–200)
HDL: 37 mg/dL — ABNORMAL LOW (ref >40)
LDL Cholesterol: 75 mg/dL (ref 0–99)
Total CHOL/HDL Ratio: 3.4 RATIO
Triglycerides: 58 mg/dL (ref <150)
VLDL: 12 mg/dL (ref 0–40)

## 2015-01-11 LAB — BRAIN NATRIURETIC PEPTIDE: B Natriuretic Peptide: 643 pg/mL — ABNORMAL HIGH (ref 0.0–100.0)

## 2015-01-11 LAB — TSH: TSH: 0.862 u[IU]/mL (ref 0.350–4.500)

## 2015-01-11 LAB — HEMOGLOBIN A1C: Hgb A1c MFr Bld: 5.5 % (ref 4.0–6.0)

## 2015-01-11 MED ORDER — METOPROLOL TARTRATE 25 MG PO TABS
25.0000 mg | ORAL_TABLET | Freq: Two times a day (BID) | ORAL | Status: DC
  Administered 2015-01-11 – 2015-01-14 (×7): 25 mg via ORAL
  Filled 2015-01-11 (×8): qty 1

## 2015-01-11 MED ORDER — ENOXAPARIN SODIUM 40 MG/0.4ML ~~LOC~~ SOLN
40.0000 mg | Freq: Two times a day (BID) | SUBCUTANEOUS | Status: DC
  Administered 2015-01-11 – 2015-01-14 (×6): 40 mg via SUBCUTANEOUS
  Filled 2015-01-11 (×6): qty 0.4

## 2015-01-11 MED ORDER — INFLUENZA VAC SPLIT QUAD 0.5 ML IM SUSY
0.5000 mL | PREFILLED_SYRINGE | INTRAMUSCULAR | Status: AC
  Administered 2015-01-12: 0.5 mL via INTRAMUSCULAR
  Filled 2015-01-11: qty 0.5

## 2015-01-11 NOTE — Progress Notes (Signed)
*  PRELIMINARY RESULTS* Echocardiogram 2D Echocardiogram has been performed.  Gary [name] 01/11/2015, 9:39 AM

## 2015-01-11 NOTE — Progress Notes (Signed)
TO CVT via bed

## 2015-01-11 NOTE — Progress Notes (Signed)
Admitted with legs swelling and shortness of breath,cxr revealed pulmonary edema consistent with CHF.Lasix therapy in progress and very effective.

## 2015-01-11 NOTE — Care Management (Signed)
Spoke with patient's wife.  Patient is employed as a Biomedical scientist.  He has not been on his blood pressure medications for years.  She is reluctant to discussed their financial status.  Patient restlessly sleeping during the entire conversation.  Provided [facility] application, [facility] application, [employer] four dollar list and [facility] Failure Clinic brochure.  There are no dependents- just patient and wife. Patient had an inpatient stay at [facility] several months ago and mentions that she and patient were informed that patient had symptoms of sleep apnea and needs to have a sleep study performed.  Patient is currently requiring 3 liters of 02

## 2015-01-11 NOTE — Progress Notes (Signed)
[facility] NAME: Gary [name]    MR#:  [id number]  DATE OF BIRTH:  March 09, 1956  SUBJECTIVE:  CHIEF COMPLAINT:  Patient is sleeping and snoring, arousable but falling asleep. He reported that he is feeling fine and fell asleep again  REVIEW OF SYSTEMS:   Review of systems are unobtainable as the patient is very sleepy DRUG ALLERGIES:  No Known Allergies  VITALS:  Blood pressure 153/89, pulse 80, temperature 98.1 F (36.7 C), temperature source Oral, resp. rate 26, height 6\' 3"  (1.905 m), weight 173.002 kg (381 lb 6.4 oz), SpO2 91 %.  PHYSICAL EXAMINATION:  GENERAL:  59 y.o.-year-old patient lying in the bed with no acute distress.  EYES: Pupils equal, round, reactive to light and accommodation. No scleral icterus.  HEENT: Head atraumatic, normocephalic. Oropharynx and nasopharynx clear.  NECK:  Supple, no jugular venous distention. No thyroid enlargement, no tenderness.  LUNGS: Normal breath sounds bilaterally, no wheezing, minimal rales, no rhonchi or crepitation. No use of accessory muscles of respiration.  CARDIOVASCULAR: S1, S2 normal. No murmurs, rubs, or gallops.  ABDOMEN: Soft, obese, nontender, nondistended. Bowel sounds present. No organomegaly or mass.  EXTREMITIES: No pedal edema, cyanosis, or clubbing.  NEUROLOGIC: Patient is very sleepy Gait not checked.  PSYCHIATRIC: The patient is arousable to verbal commands but falling asleep SKIN: No obvious rash, lesion, or ulcer.    LABORATORY PANEL:   CBC  Recent Labs Lab 01/10/15 1750  WBC 6.8  HGB 13.9  HCT 47.0  PLT 129*   ------------------------------------------------------------------------------------------------------------------  Chemistries   Recent Labs Lab 01/10/15 1603  NA 143  K 5.5*  CL 107  CO2 26  GLUCOSE 90  BUN 11  CREATININE 0.88  CALCIUM 8.2*    ------------------------------------------------------------------------------------------------------------------  Cardiac Enzymes  Recent Labs Lab 01/11/15 0440  TROPONINI <0.03   ------------------------------------------------------------------------------------------------------------------  RADIOLOGY:  Dg Chest 2 View  01/10/2015   CLINICAL DATA:  Complains of swelling in feet and ankles for 2 weeks  EXAM: CHEST  2 VIEW  COMPARISON:  03/31/2014  FINDINGS: There is moderate cardiac enlargement. There is pulmonary vascular congestion and mild edema. No pleural effusions identified. No airspace consolidation.  IMPRESSION: 1. Cardiac enlargement and mild edema consistent with CHF.   Electronically Signed   By: Kerby Moors M.D.   On: 01/10/2015 15:13   US Venous Img Lower Bilateral  01/10/2015   CLINICAL DATA:  Bilateral lower extremity edema 2 weeks.  EXAM: BILATERAL LOWER EXTREMITY VENOUS DOPPLER ULTRASOUND  TECHNIQUE: Gray-scale sonography with graded compression, as well as color Doppler and duplex ultrasound were performed to evaluate the lower extremity deep venous systems from the level of the common femoral vein and including the common femoral, femoral, profunda femoral, popliteal and calf veins including the posterior tibial, peroneal and gastrocnemius veins when visible. The superficial great saphenous vein was also interrogated. Spectral Doppler was utilized to evaluate flow at rest and with distal augmentation maneuvers in the common femoral, femoral and popliteal veins.  COMPARISON:  None.  FINDINGS: Exam difficult due to patient body habitus.  RIGHT LOWER EXTREMITY  Common Femoral Vein: No evidence of thrombus. Normal compressibility, respiratory phasicity and response to augmentation.  Saphenofemoral Junction: No evidence of thrombus. Normal compressibility and flow on color Doppler imaging.  Profunda Femoral Vein: No evidence of thrombus. Normal compressibility and flow on  color Doppler imaging.  Femoral Vein: No evidence of thrombus. Normal compressibility, respiratory phasicity and response to augmentation.  Popliteal Vein: No evidence of thrombus. Normal compressibility, respiratory phasicity and response to augmentation.  Calf Veins: No evidence of thrombus. Normal compressibility and flow on color Doppler imaging. Perineal veins not well visualized.  Superficial Great Saphenous Vein: No evidence of thrombus. Normal compressibility and flow on color Doppler imaging.  Venous Reflux:  None.  Other Findings:  None.  LEFT LOWER EXTREMITY  Common Femoral Vein: No evidence of thrombus. Normal compressibility, respiratory phasicity and response to augmentation.  Saphenofemoral Junction: No evidence of thrombus. Normal compressibility and flow on color Doppler imaging.  Profunda Femoral Vein: No evidence of thrombus. Normal compressibility and flow on color Doppler imaging.  Femoral Vein: No evidence of thrombus. Normal compressibility, respiratory phasicity and response to augmentation.  Popliteal Vein: No evidence of thrombus. Normal compressibility, respiratory phasicity and response to augmentation.  Calf Veins: Not visualized.  Superficial Great Saphenous Vein: No evidence of thrombus. Normal compressibility and flow on color Doppler imaging.  Venous Reflux:  None.  Other Findings:  None.  IMPRESSION: No evidence of deep venous thrombosis.   Electronically Signed   By: Marin Olp M.D.   On: 01/10/2015 15:54    EKG:   Orders placed or performed during the hospital encounter of 01/10/15  . ED EKG within 10 minutes  . ED EKG within 10 minutes    ASSESSMENT AND PLAN:   #1 pulmonary edema and lower extremity edema likely due to congestive heart failure:  This would be a new diagnosis for this patient.  Check  2-D echocardiogram.  Continue  telemetry.  Will check daily weights, I's and O's and started low sodium diet.  We'll continue with Lasix as his renal function is  excellent .  If he is found to have congestive heart failure he will need an ischemic workup which could be done during this admission. Will also check a TSH,BNP  UDS is positive for opiates   #2 chest pain: No significant T-wave changes on EKG.  Continue to monitor on telemetry.  Continue to cycle troponins, first is negative.  Continue aspirin and low-dose beta blocker.  LDL at 75 TSH and A1c are normal   #3 hypertension: Blood pressure coming down with Lasix. Continue this for now. Provide when necessary hydralazine  #4 tobacco abuse: Tobacco cessation counseling provided. Need for 5-10 minutes. This may also be contributing to his shortness of breath. Nicotine patch  #5 hyperkalemia: No concerning changes on EKG. Monitor on telemetry. This should decrease with Lasix administration. Continue to monitor, check BMP in a.m.   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 Probably up to obstructive sleep apnea CPAP trial tonight Will recommend sleep study as an outpatient  Prophylaxis: Heparin for DVT prophylaxis, no GI prophylaxis at this time      All the records are reviewed and case discussed with Care Management/Social Workerr. Management plans discussed with the patient, family and they are in agreement.  CODE STATUS: full code  TOTAL TIME TAKING CARE OF THIS PATIENT:   35 minutes   POSSIBLE D/C IN 2  DAYS, DEPENDING ON CLINICAL CONDITION.   Nicholes Mango M.D on 01/11/2015 at 2:12 PM  Between 7am to 6pm - Pager - [phone] After 6pm go to [email or web address] - password [email or web address] [facility]  [facility]  Office  [phone]  CC: Primary care physician; No primary care provider on file.

## 2015-01-11 NOTE — Care Management (Signed)
Admitted with new onset of congestive heart failure.  Currently requiring 02 for room air sats of 88 or less.  Patient is uninsured.  Currently employed but not able to afford insurance.  Would benefit from referral to heart failure clinic

## 2015-01-11 NOTE — Progress Notes (Signed)
Back from CVT

## 2015-01-12 ENCOUNTER — Encounter: Payer: Self-pay | Admitting: Physician Assistant

## 2015-01-12 ENCOUNTER — Other Ambulatory Visit: Payer: Self-pay

## 2015-01-12 DIAGNOSIS — J9601 Acute respiratory failure with hypoxia: Secondary | ICD-10-CM

## 2015-01-12 DIAGNOSIS — R0602 Shortness of breath: Secondary | ICD-10-CM | POA: Insufficient documentation

## 2015-01-12 DIAGNOSIS — I5043 Acute on chronic combined systolic (congestive) and diastolic (congestive) heart failure: Secondary | ICD-10-CM

## 2015-01-12 DIAGNOSIS — R931 Abnormal findings on diagnostic imaging of heart and coronary circulation: Secondary | ICD-10-CM | POA: Insufficient documentation

## 2015-01-12 DIAGNOSIS — I5033 Acute on chronic diastolic (congestive) heart failure: Secondary | ICD-10-CM | POA: Insufficient documentation

## 2015-01-12 DIAGNOSIS — F1910 Other psychoactive substance abuse, uncomplicated: Secondary | ICD-10-CM

## 2015-01-12 DIAGNOSIS — F17200 Nicotine dependence, unspecified, uncomplicated: Secondary | ICD-10-CM | POA: Insufficient documentation

## 2015-01-12 DIAGNOSIS — R600 Localized edema: Secondary | ICD-10-CM | POA: Insufficient documentation

## 2015-01-12 DIAGNOSIS — J9621 Acute and chronic respiratory failure with hypoxia: Secondary | ICD-10-CM | POA: Insufficient documentation

## 2015-01-12 DIAGNOSIS — Z720 Tobacco use: Secondary | ICD-10-CM

## 2015-01-12 LAB — BASIC METABOLIC PANEL
Anion gap: 7 (ref 5–15)
BUN: 12 mg/dL (ref 6–20)
CO2: 36 mmol/L — ABNORMAL HIGH (ref 22–32)
Calcium: 8.1 mg/dL — ABNORMAL LOW (ref 8.9–10.3)
Chloride: 96 mmol/L — ABNORMAL LOW (ref 101–111)
Creatinine, Ser: 0.97 mg/dL (ref 0.61–1.24)
GFR calc Af Amer: >60 mL/min (ref >60)
GFR calc non Af Amer: >60 mL/min (ref >60)
Glucose, Bld: 93 mg/dL (ref 65–99)
Potassium: 3.9 mmol/L (ref 3.5–5.1)
Sodium: 139 mmol/L (ref 135–145)

## 2015-01-12 LAB — CBC
HCT: 46.9 % (ref 40.0–52.0)
Hemoglobin: 14.1 g/dL (ref 13.0–18.0)
MCH: 22.3 pg — ABNORMAL LOW (ref 26.0–34.0)
MCHC: 30 g/dL — ABNORMAL LOW (ref 32.0–36.0)
MCV: 74.5 fL — ABNORMAL LOW (ref 80.0–100.0)
Platelets: 125 10*3/uL — ABNORMAL LOW (ref 150–440)
RBC: 6.3 MIL/uL — ABNORMAL HIGH (ref 4.40–5.90)
RDW: 16.9 % — ABNORMAL HIGH (ref 11.5–14.5)
WBC: 7.2 10*3/uL (ref 3.8–10.6)

## 2015-01-12 MED ORDER — PRAVASTATIN SODIUM 10 MG PO TABS
10.0000 mg | ORAL_TABLET | Freq: Every day | ORAL | Status: DC
  Administered 2015-01-13: 10 mg via ORAL
  Filled 2015-01-12: qty 1

## 2015-01-12 MED ORDER — LOSARTAN POTASSIUM 25 MG PO TABS
25.0000 mg | ORAL_TABLET | Freq: Every day | ORAL | Status: DC
Start: 2015-01-12 — End: 2015-01-14
  Administered 2015-01-12 – 2015-01-14 (×3): 25 mg via ORAL
  Filled 2015-01-12 (×3): qty 1

## 2015-01-12 MED ORDER — SENNA 8.6 MG PO TABS: 1.0000 | ORAL_TABLET | Freq: Every day | ORAL | Status: DC | PRN

## 2015-01-12 MED ORDER — DOCUSATE SODIUM 100 MG PO CAPS
100.0000 mg | ORAL_CAPSULE | Freq: Two times a day (BID) | ORAL | Status: DC
  Administered 2015-01-12 – 2015-01-14 (×4): 100 mg via ORAL
  Filled 2015-01-12 (×4): qty 1

## 2015-01-12 NOTE — Progress Notes (Signed)
Patient has signed consent for lexiscan. Placed in chart.

## 2015-01-12 NOTE — Plan of Care (Signed)
Problem: Food- and Nutrition-Related Knowledge Deficit (NB-1.1) Goal: Nutrition education Formal process to instruct or train a patient/client in a skill or to impart knowledge to help patients/clients voluntarily manage or modify food choices and eating behavior to maintain or improve health. Outcome: Completed/Met Date Met:  01/12/15 Nutrition Education Note  RD consulted for nutrition education regarding new onset CHF.  RD provided "Heart Failure Nutrition Therapy" handout from the Academy of Nutrition and Dietetics. Reviewed patient's dietary recall. Provided examples on ways to decrease sodium intake in diet. Discouraged intake of processed foods and use of salt shaker. Encouraged fresh fruits and vegetables as well as whole grain sources of carbohydrates to maximize fiber intake.   RD discussed why it is important for patient to adhere to diet recommendations, and emphasized the role of fluids, foods to avoid, and importance of weighing self daily. Teach back method used.  Pt very receptive to education, appears ready for change. Expect good compliance.  Kerman Passey Clark, [place], LDN [phone] Pager
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
## 2015-01-12 NOTE — Progress Notes (Signed)
[facility] at [place] NAME: Gary [name]    MR#:  [id number]  DATE OF BIRTH:  July 02, 1955  SUBJECTIVE:  CHIEF COMPLAINT:  Patient is OOB to chair . He reported that he is feeling fine and for 2 day stress test tomorrow  REVIEW OF SYSTEMS:   Constitutional: No fever , positive chills and night sweats. No weight changes Eyes: No blurry vision or double vision.  ENT: No sore throat. Cardiovascular: No chest pain. Respiratory: Positive shortness of breath, no cough. Gastrointestinal: Negative for abdominal pain, vomiting and diarrhea. No BRBPR or melena. Genitourinary: Negative for dysuria, urinary retention, bloody urine, or difficulty urinating. Musculoskeletal: Bilateral leg swelling Skin: Negative for rash. Neurological: Negative for headaches, focal weakness or numbness. Psychiatric: No anxiety or depression.  Endocrine: No hot/cold intolerance, changes in energy, or sleep difficulty.  DRUG ALLERGIES:  No Known Allergies  VITALS:  Blood pressure 158/86, pulse 81, temperature 98.9 F (37.2 C), temperature source Oral, resp. rate 22, height 6\' 3"  (1.905 m), weight 170.054 kg (374 lb 14.4 oz), SpO2 95 %.  PHYSICAL EXAMINATION:  GENERAL:  59 y.o.-year-old patient lying in the bed with no acute distress.  EYES: Pupils equal, round, reactive to light and accommodation. No scleral icterus.  HEENT: Head atraumatic, normocephalic. Oropharynx and nasopharynx clear.  NECK:  Supple, no jugular venous distention. No thyroid enlargement, no tenderness.  LUNGS: Normal breath sounds bilaterally, no wheezing, minimal rales, no rhonchi or crepitation. No use of accessory muscles of respiration.  CARDIOVASCULAR: S1, S2 normal. No murmurs, rubs, or gallops.  ABDOMEN: Soft, obese, nontender, nondistended. Bowel sounds present. No organomegaly or mass.  EXTREMITIES: 2 plus  pedal edema, cyanosis, or clubbing.  NEUROLOGIC:  Patient is awake , alert and oriented Gait not checked.  PSYCHIATRIC: The patient is with nml mood and effect SKIN: No obvious rash, lesion, or ulcer.    LABORATORY PANEL:   CBC  Recent Labs Lab 01/12/15 0506  WBC 7.2  HGB 14.1  HCT 46.9  PLT 125*   ------------------------------------------------------------------------------------------------------------------  Chemistries   Recent Labs Lab 01/12/15 0506  NA 139  K 3.9  CL 96*  CO2 36*  GLUCOSE 93  BUN 12  CREATININE 0.97  CALCIUM 8.1*   ------------------------------------------------------------------------------------------------------------------  Cardiac Enzymes  Recent Labs Lab 01/11/15 0440  TROPONINI <0.03   ------------------------------------------------------------------------------------------------------------------  RADIOLOGY:  No results found.  EKG:   Orders placed or performed during the hospital encounter of 01/10/15  . ED EKG within 10 minutes  . ED EKG within 10 minutes    ASSESSMENT AND PLAN:   #1 pulmonary edema and lower extremity edema likely due to acute on ch diastolic congestive heart failure:    2-D echocardiogram with 50 % lvef and wall motion abnormalities Continue  telemetry.  Will check daily weights, I's and O's and started low sodium diet.  We'll continue with Lasix as his renal function is excellent .  On asa and bb. Statin and losartan added UDS is positive for opiates   #2 chest pain: No significant T-wave changes on EKG.  Continue to monitor on telemetry.  For 2 day stresss test tomorrow Appreciate cardio recommendations by dr.Gollan  Continue aspirin and low-dose beta blocker.  LDL at 75 TSH and A1c are normal   #3 hypertension: Blood pressure coming down with Lasix. Continue this for now. Provide when necessary hydralazine  #4 tobacco abuse: Tobacco cessation counseling provided. Need for 5-10 minutes. This may also be contributing  to his shortness of  breath. Nicotine patch  #5 hyperkalemia: No concerning changes on EKG. Monitor on telemetry. This should decrease with Lasix administration. Continue to monitor, check BMP in a.m.   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 Probably up to obstructive sleep apnea CPAP q nightly Will recommend sleep study as an outpatient  Prophylaxis: Heparin for DVT prophylaxis, no GI prophylaxis at this time      All the records are reviewed and case discussed with Care Management/Social Workerr. Management plans discussed with the patient, family and they are in agreement.  CODE STATUS: full code  TOTAL TIME TAKING CARE OF THIS PATIENT:   35 minutes   POSSIBLE D/C IN 2  DAYS, DEPENDING ON CLINICAL CONDITION.   Nicholes Mango M.D on 01/12/2015 at 8:51 PM  Between 7am to 6pm - Pager - [phone] After 6pm go to [email or web address] - password [email or web address] [facility]  [facility]  Office  [phone]  CC: Primary care physician; No primary care provider on file.

## 2015-01-12 NOTE — Progress Notes (Signed)
Pt asking about medication to help him have a BM, pt reports it has been several days.  MD, Dr. Ether Griffins notified, MD to place orders.  Will continue to monitor. Jessee Avers

## 2015-01-12 NOTE — Progress Notes (Signed)
Patient and wife watching #2 of CHF videos.

## 2015-01-12 NOTE — Progress Notes (Signed)
Pt educated several times since start of shift about the importance of wearing his oxygen.  During rounds/medication passes pt will not be wearing oxygen.  Will continue to monitor. Gary [name]

## 2015-01-12 NOTE — Progress Notes (Addendum)
Patient and wife are watching #1 of CHF videos. Education provided on heart healthy & low fat/cholesterol diet, daily weights and chf.
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
## 2015-01-12 NOTE — Care Management (Signed)
Spoke with cardiology.  Order has been placed for overnight oximetry.  For lexiscan today.  Unsure if will require a 2 day test due to size.  At present there is concern that patient is going to require home 02

## 2015-01-12 NOTE — Care Management (Signed)
Provided patient with scales.

## 2015-01-12 NOTE — Progress Notes (Signed)
Patient was made an initial appointment at the outpatient [facility] on January 26, 2015 at 9:30am. Thank you

## 2015-01-13 ENCOUNTER — Other Ambulatory Visit: Payer: Self-pay

## 2015-01-13 ENCOUNTER — Inpatient Hospital Stay (HOSPITAL_COMMUNITY): Payer: Self-pay

## 2015-01-13 ENCOUNTER — Encounter: Payer: Self-pay | Admitting: Radiology

## 2015-01-13 DIAGNOSIS — R0602 Shortness of breath: Secondary | ICD-10-CM

## 2015-01-13 MED ORDER — TECHNETIUM TC 99M SESTAMIBI - CARDIOLITE
30.0000 | Freq: Once | INTRAVENOUS | Status: AC | PRN
  Administered 2015-01-13: 09:00:00 30.823 via INTRAVENOUS

## 2015-01-13 MED ORDER — SENNA 8.6 MG PO TABS
1.0000 | ORAL_TABLET | Freq: Two times a day (BID) | ORAL | Status: DC
  Administered 2015-01-13 – 2015-01-14 (×3): 8.6 mg via ORAL
  Filled 2015-01-13 (×4): qty 1

## 2015-01-13 MED ORDER — REGADENOSON 0.4 MG/5ML IV SOLN
0.4000 mg | Freq: Once | INTRAVENOUS | Status: AC
  Administered 2015-01-13: 0.4 mg via INTRAVENOUS
  Filled 2015-01-13: qty 5

## 2015-01-13 NOTE — Progress Notes (Signed)
Asked pt about wearing his oxygen, pt states that he takes it off because he is up and down using the bathroom. Asked pt about wearing the CPAP and he states that he will be getting up and down. When asked about the overnight oximetry he stated he would be taking it on and off because he needed to use the bathroom. Explained that he has a urinal at bedside, he said he would still have to take it off. Explained this to the RN Rachael and she stated that she had been taking his oxygen off in the same manner.

## 2015-01-13 NOTE — Progress Notes (Signed)
[facility] at [place] NAME: Gary [name]    MR#:  [id number]  DATE OF BIRTH:  09/30/1955  SUBJECTIVE:  CHIEF COMPLAINT:  Patient is OOB to chair . Non compliant with overnight oximetry.  For first day of 2 day stress test today  REVIEW OF SYSTEMS:   Constitutional: No fever , positive chills and night sweats. No weight changes Eyes: No blurry vision or double vision.  ENT: No sore throat. Cardiovascular: No chest pain. Respiratory: Positive shortness of breath, no cough. Gastrointestinal: Negative for abdominal pain, vomiting and diarrhea. No BRBPR or melena. Genitourinary: Negative for dysuria, urinary retention, bloody urine, or difficulty urinating. Musculoskeletal: Bilateral leg swelling Skin: Negative for rash. Neurological: Negative for headaches, focal weakness or numbness. Psychiatric: No anxiety or depression.  Endocrine: No hot/cold intolerance, changes in energy, or sleep difficulty.  DRUG ALLERGIES:  No Known Allergies  VITALS:  Blood pressure 144/97, pulse 81, temperature 98.6 F (37 C), temperature source Oral, resp. rate 18, height 6\' 3"  (1.905 m), weight 168.466 kg (371 lb 6.4 oz), SpO2 92 %.  PHYSICAL EXAMINATION:  GENERAL:  59 y.o.-year-old patient lying in the bed with no acute distress.  EYES: Pupils equal, round, reactive to light and accommodation. No scleral icterus.  HEENT: Head atraumatic, normocephalic. Oropharynx and nasopharynx clear.  NECK:  Supple, no jugular venous distention. No thyroid enlargement, no tenderness.  LUNGS: Normal breath sounds bilaterally, no wheezing, minimal rales, no rhonchi or crepitation. No use of accessory muscles of respiration.  CARDIOVASCULAR: S1, S2 normal. No murmurs, rubs, or gallops.  ABDOMEN: Soft, obese, nontender, nondistended. Bowel sounds present. No organomegaly or mass.  EXTREMITIES: 2 plus  pedal edema, cyanosis, or clubbing.   NEUROLOGIC: Patient is awake , alert and oriented Gait not checked.  PSYCHIATRIC: The patient is with nml mood and effect SKIN: No obvious rash, lesion, or ulcer.    LABORATORY PANEL:   CBC  Recent Labs Lab 01/12/15 0506  WBC 7.2  HGB 14.1  HCT 46.9  PLT 125*   ------------------------------------------------------------------------------------------------------------------  Chemistries   Recent Labs Lab 01/12/15 0506  NA 139  K 3.9  CL 96*  CO2 36*  GLUCOSE 93  BUN 12  CREATININE 0.97  CALCIUM 8.1*   ------------------------------------------------------------------------------------------------------------------  Cardiac Enzymes  Recent Labs Lab 01/11/15 0440  TROPONINI <0.03   ------------------------------------------------------------------------------------------------------------------  RADIOLOGY:  No results found.  EKG:   Orders placed or performed during the hospital encounter of 01/10/15  . ED EKG within 10 minutes  . ED EKG within 10 minutes    ASSESSMENT AND PLAN:   #1 pulmonary edema and lower extremity edema likely due to acute on ch diastolic congestive heart failure:    2-D echocardiogram with 50 % lvef and wall motion abnormalities Continue  telemetry.  Will check daily weights, I's and O's and started low sodium diet.  We'll continue with Lasix as his renal function is excellent .  On asa and bb. Statin and losartan added UDS is positive for opiates   #2 chest pain: No significant T-wave changes on EKG.  Continue to monitor on telemetry.  Had first part of 2 day stresss test today, for second part tomorrow Appreciate cardio recommendations by dr.Gollan  Continue aspirin and low-dose beta blocker.  LDL at 75 TSH and A1c are normal   #3 hypertension: Blood pressure coming down with Lasix. Continue this for now. Provide when necessary hydralazine  #4 tobacco abuse: Tobacco cessation counseling provided. Need  for 5-10 minutes.  This may also be contributing to his shortness of breath. Nicotine patch  #5 hyperkalemia: No concerning changes on EKG. Monitor on telemetry. This should decrease with Lasix administration. Continue to monitor, check BMP in a.m.   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 Probably up to obstructive sleep apnea Pt is non compliant with over night oximetry Will recommend sleep study as an outpatient  Prophylaxis: Heparin for DVT prophylaxis, no GI prophylaxis at this time      All the records are reviewed and case discussed with Care Management/Social Workerr. Management plans discussed with the patient, family and they are in agreement.  CODE STATUS: full code  TOTAL TIME TAKING CARE OF THIS PATIENT:   35 minutes   POSSIBLE D/C IN am DAYS, DEPENDING ON CLINICAL CONDITION.   Nicholes Mango M.D on 01/13/2015 at 10:39 PM  Between 7am to 6pm - Pager - [phone] After 6pm go to [email or web address] - password [email or web address] [facility]  [place] [facility]  Office  [phone]  CC: Primary care physician; No primary care provider on file.

## 2015-01-13 NOTE — Care Management (Signed)
Patient ids demonstrating noncompliance with nasal 02 and did not cooperate with overnight oximetry

## 2015-01-13 NOTE — Progress Notes (Signed)
    Patient is for [employer] today. This is a 2-day study given his habitus. He did not complete his overnight oximetry 2/2 combative behavior. In the stress lab he was noted to be snoring while sleeping prior to the test.     Christell Faith, PA-C 01/13/2015 9:33 AM

## 2015-01-14 ENCOUNTER — Other Ambulatory Visit: Payer: Self-pay

## 2015-01-14 ENCOUNTER — Encounter: Payer: Self-pay | Attending: Physician Assistant

## 2015-01-14 ENCOUNTER — Telehealth: Payer: Self-pay

## 2015-01-14 DIAGNOSIS — R0602 Shortness of breath: Secondary | ICD-10-CM | POA: Insufficient documentation

## 2015-01-14 DIAGNOSIS — I5033 Acute on chronic diastolic (congestive) heart failure: Principal | ICD-10-CM

## 2015-01-14 LAB — NM MYOCAR MULTI W/SPECT W/WALL MOTION / EF
LV dias vol: 207 mL
LV sys vol: 92 mL
Peak HR: 81 {beats}/min
Percent HR: 49 %
Rest HR: 72 {beats}/min
SDS: 10
SRS: 4
SSS: 12
TID: 1.23

## 2015-01-14 LAB — BASIC METABOLIC PANEL
Anion gap: 3 — ABNORMAL LOW (ref 5–15)
BUN: 14 mg/dL (ref 6–20)
CO2: 35 mmol/L — ABNORMAL HIGH (ref 22–32)
Calcium: 8.2 mg/dL — ABNORMAL LOW (ref 8.9–10.3)
Chloride: 100 mmol/L — ABNORMAL LOW (ref 101–111)
Creatinine, Ser: 0.92 mg/dL (ref 0.61–1.24)
GFR calc Af Amer: >60 mL/min (ref >60)
GFR calc non Af Amer: >60 mL/min (ref >60)
Glucose, Bld: 126 mg/dL — ABNORMAL HIGH (ref 65–99)
Potassium: 3.4 mmol/L — ABNORMAL LOW (ref 3.5–5.1)
Sodium: 138 mmol/L (ref 135–145)

## 2015-01-14 MED ORDER — LOSARTAN POTASSIUM 25 MG PO TABS: 25.0000 mg | ORAL_TABLET | Freq: Every day | ORAL | Status: DC

## 2015-01-14 MED ORDER — METOPROLOL TARTRATE 25 MG PO TABS: 25.0000 mg | ORAL_TABLET | Freq: Two times a day (BID) | ORAL | Status: DC

## 2015-01-14 MED ORDER — PRAVASTATIN SODIUM 10 MG PO TABS: 10.0000 mg | ORAL_TABLET | Freq: Every day | ORAL | Status: DC

## 2015-01-14 MED ORDER — NICOTINE 14 MG/24HR TD PT24: 14.0000 mg | MEDICATED_PATCH | Freq: Every day | TRANSDERMAL | Status: DC

## 2015-01-14 MED ORDER — FUROSEMIDE 40 MG PO TABS
40.0000 mg | ORAL_TABLET | Freq: Every day | ORAL | Status: DC
Start: 2015-01-14 — End: 2015-02-28

## 2015-01-14 MED ORDER — SIMVASTATIN 10 MG PO TABS: 10.0000 mg | ORAL_TABLET | Freq: Every day | ORAL | Status: DC

## 2015-01-14 MED ORDER — ASPIRIN 81 MG PO CHEW: 81.0000 mg | CHEWABLE_TABLET | Freq: Every day | ORAL | Status: DC

## 2015-01-14 MED ORDER — FUROSEMIDE 40 MG PO TABS: 40.0000 mg | ORAL_TABLET | Freq: Every day | ORAL | Status: DC

## 2015-01-14 MED ORDER — TECHNETIUM TC 99M SESTAMIBI - CARDIOLITE
30.1000 | Freq: Once | INTRAVENOUS | Status: AC | PRN
  Administered 2015-01-14: 08:00:00 30.1 via INTRAVENOUS

## 2015-01-14 MED ORDER — DOCUSATE SODIUM 100 MG PO CAPS: 100.0000 mg | ORAL_CAPSULE | Freq: Every day | ORAL | Status: DC | PRN

## 2015-01-14 NOTE — Telephone Encounter (Signed)
-----   Message from Blain Pais sent at 01/14/2015 12:05 PM EDT ----- Regarding: tcm/ph 01/25/2015 Ignacia Bayley, NP 1:30

## 2015-01-14 NOTE — Progress Notes (Signed)
SATURATION QUALIFICATIONS: (This note is used to comply with regulatory documentation for home oxygen)  Patient Saturations on Room Air at Rest = 84%  Patient Saturations on Room Air while Ambulating = not obtained, unsafe to ambulate patient with resting o2 sats at 84%%  Patient Saturations on 4 Liters of oxygen while Ambulating = 92%  Please briefly explain why patient needs home oxygen:

## 2015-01-14 NOTE — Progress Notes (Addendum)
Pt. Discharged to home via wc with oxygen in place; pt insisted on driving himself home, no family available to take him home. Discharge instructions and medication regimen reviewed at bedside with patient. Pt. verbalizes understanding of instructions and medication regimen. Prescriptions included with discharge instructions; pt reports understanding he knows he needs to go across the street to pick up his medications. Patient assessment unchanged from this morning. Pt educated throughout the day about need to wear O2 continuously. RN stressed the importance of wearing it to keep him healthy and ensure that o2 is delivered to his body; RN stressed that it is NOT safe to not wear oxygen and have SpO2 at 84%. Pt does not acknowledge importance verbally, but nods head; however patient is not wearing his oxygen at this time. TELE and IV discontinued per policy.

## 2015-01-14 NOTE — Care Management (Signed)
Discussed during progression of the urgent need to assess for home 02 in anticipation of discharge within next 24 hours. Will discuss the need to have idea of discharge meds with attending so can anticipate if there will need for assistance
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
## 2015-01-14 NOTE — Care Management (Signed)
Patient for discharge and has qualified for home 02.  [facility] will assess for charity care criteria and will provide the 02.  Portable tanks have been delivered to the room.  [facility]  Patient asks about cpap and discussed his non compliance with the attempts to obtain overnight oximetry.  Spoke with Dr Fletcher Anon and he stated that his office could refer patient for outpatient sleep study if he keeps his appointment.  Discussed the importance of keeping all follow up appointments.  Patient will not qualify for home health nursing follow up because he says " I am going back to work as soon as i can."  If is do not work- I do not have a home."  A referral to the heart failure clinic has been made.
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
## 2015-01-14 NOTE — Telephone Encounter (Signed)
Patient contacted regarding discharge from [facility] on 01/14/15. Spoke w/ pt's wife.  She understands the following: Patient understands to follow up with Ignacia Bayley, NP on 01/25/15 at 1:30 at [facility]. Patient understands discharge instructions? yes Patient understands medications and regiment? yes Patient understands to bring all medications to this visit? yes

## 2015-01-14 NOTE — Discharge Instructions (Signed)
[facility] appointment on January 26, 2015 at 9:30am with Gary [name], [place]. Please call [phone] to reschedule.  F/u with pcp, cardio and pulm as recommended Continue home o2 Activity as tolerated Diet - healthy heart   Losartan tablets What is this medicine? LOSARTAN (loe SAR tan) is used to treat high blood pressure and to reduce the risk of stroke in certain patients. This drug also slows the progression of kidney disease in patients with diabetes. This medicine may be used for other purposes; ask your health care provider or pharmacist if you have questions. COMMON BRAND NAME(S): Cozaar What should I tell my health care provider before I take this medicine? They need to know if you have any of these conditions: -heart failure -kidney or liver disease -an unusual or allergic reaction to losartan, other medicines, foods, dyes, or preservatives -pregnant or trying to get pregnant -breast-feeding How should I use this medicine? Take this medicine by mouth with a glass of water. Follow the directions on the prescription label. This medicine can be taken with or without food. Take your doses at regular intervals. Do not take your medicine more often than directed. Talk to your pediatrician regarding the use of this medicine in children. Special care may be needed. Overdosage: If you think you have taken too much of this medicine contact a poison control center or emergency room at once. NOTE: This medicine is only for you. Do not share this medicine with others. What if I miss a dose? If you miss a dose, take it as soon as you can. If it is almost time for your next dose, take only that dose. Do not take double or extra doses. What may interact with this medicine? -blood pressure medicines -diuretics, especially triamterene, spironolactone, or amiloride -fluconazole -NSAIDs, medicines for pain and inflammation, like ibuprofen or naproxen -potassium salts or potassium  supplements -rifampin This list may not describe all possible interactions. Give your health care provider a list of all the medicines, herbs, non-prescription drugs, or dietary supplements you use. Also tell them if you smoke, drink alcohol, or use illegal drugs. Some items may interact with your medicine. What should I watch for while using this medicine? Visit your doctor or health care professional for regular checks on your progress. Check your blood pressure as directed. Ask your doctor or health care professional what your blood pressure should be and when you should contact him or her. Call your doctor or health care professional if you notice an irregular or fast heart beat. Women should inform their doctor if they wish to become pregnant or think they might be pregnant. There is a potential for serious side effects to an unborn child, particularly in the second or third trimester. Talk to your health care professional or pharmacist for more information. You may get drowsy or dizzy. Do not drive, use machinery, or do anything that needs mental alertness until you know how this drug affects you. Do not stand or sit up quickly, especially if you are an older patient. This reduces the risk of dizzy or fainting spells. Alcohol can make you more drowsy and dizzy. Avoid alcoholic drinks. Avoid salt substitutes unless you are told otherwise by your doctor or health care professional. Do not treat yourself for coughs, colds, or pain while you are taking this medicine without asking your doctor or health care professional for advice. Some ingredients may increase your blood pressure. What side effects may I notice from receiving this medicine? Side  effects that you should report to your doctor or health care professional as soon as possible: -confusion, dizziness, light headedness or fainting spells -decreased amount of urine passed -difficulty breathing or swallowing, hoarseness, or tightening of the  throat -fast or irregular heart beat, palpitations, or chest pain -skin rash, itching -swelling of your face, lips, tongue, hands, or feet Side effects that usually do not require medical attention (report to your doctor or health care professional if they continue or are bothersome): -cough -decreased sexual function or desire -headache -nasal congestion or stuffiness -nausea or stomach pain -sore or cramping muscles This list may not describe all possible side effects. Call your doctor for medical advice about side effects. You may report side effects to FDA at 1-800-FDA-1088. Where should I keep my medicine? Keep out of the reach of children. Store at room temperature between 15 and 30 degrees C (59 and 86 degrees F). Protect from light. Keep container tightly closed. Throw away any unused medicine after the expiration date. NOTE: This sheet is a summary. It may not cover all possible information. If you have questions about this medicine, talk to your doctor, pharmacist, or health care provider.  2015, Elsevier/Gold Standard. (2007-06-13 16:42:18)  Metoprolol tablets What is this medicine? METOPROLOL (me TOE proe lole) is a beta-blocker. Beta-blockers reduce the workload on the heart and help it to beat more regularly. This medicine is used to treat high blood pressure and to prevent chest pain. It is also used to after a heart attack and to prevent an additional heart attack from occurring. This medicine may be used for other purposes; ask your health care provider or pharmacist if you have questions. COMMON BRAND NAME(S): Lopressor What should I tell my health care provider before I take this medicine? They need to know if you have any of these conditions: -diabetes -heart or vessel disease like slow heart rate, worsening heart failure, heart block, sick sinus syndrome or Raynaud's disease -kidney disease -liver disease -lung or breathing disease, like asthma or  emphysema -pheochromocytoma -thyroid disease -an unusual or allergic reaction to metoprolol, other beta-blockers, medicines, foods, dyes, or preservatives -pregnant or trying to get pregnant -breast-feeding How should I use this medicine? Take this medicine by mouth with a drink of water. Follow the directions on the prescription label. Take this medicine immediately after meals. Take your doses at regular intervals. Do not take more medicine than directed. Do not stop taking this medicine suddenly. This could lead to serious heart-related effects. Talk to your pediatrician regarding the use of this medicine in children. Special care may be needed. Overdosage: If you think you have taken too much of this medicine contact a poison control center or emergency room at once. NOTE: This medicine is only for you. Do not share this medicine with others. What if I miss a dose? If you miss a dose, take it as soon as you can. If it is almost time for your next dose, take only that dose. Do not take double or extra doses. What may interact with this medicine? This medicine may interact with the following medications: -certain medicines for blood pressure, heart disease, irregular heart beat -certain medicines for depression like monoamine oxidase (MAO) inhibitors, fluoxetine, or paroxetine -clonidine -dobutamine -epinephrine -isoproterenol -reserpine This list may not describe all possible interactions. Give your health care provider a list of all the medicines, herbs, non-prescription drugs, or dietary supplements you use. Also tell them if you smoke, drink alcohol, or use illegal drugs.  Some items may interact with your medicine. What should I watch for while using this medicine? Visit your doctor or health care professional for regular check ups. Contact your doctor right away if your symptoms worsen. Check your blood pressure and pulse rate regularly. Ask your health care professional what your  blood pressure and pulse rate should be, and when you should contact them. You may get drowsy or dizzy. Do not drive, use machinery, or do anything that needs mental alertness until you know how this medicine affects you. Do not sit or stand up quickly, especially if you are an older patient. This reduces the risk of dizzy or fainting spells. Contact your doctor if these symptoms continue. Alcohol may interfere with the effect of this medicine. Avoid alcoholic drinks. What side effects may I notice from receiving this medicine? Side effects that you should report to your doctor or health care professional as soon as possible: -allergic reactions like skin rash, itching or hives -cold or numb hands or feet -depression -difficulty breathing -faint -fever with sore throat -irregular heartbeat, chest pain -rapid weight gain -swollen legs or ankles Side effects that usually do not require medical attention (report to your doctor or health care professional if they continue or are bothersome): -anxiety or nervousness -change in sex drive or performance -dry skin -headache -nightmares or trouble sleeping -short term memory loss -stomach upset or diarrhea -unusually tired This list may not describe all possible side effects. Call your doctor for medical advice about side effects. You may report side effects to FDA at 1-800-FDA-1088. Where should I keep my medicine? Keep out of the reach of children. Store at room temperature between 15 and 30 degrees C (59 and 86 degrees F). Throw away any unused medicine after the expiration date. NOTE: This sheet is a summary. It may not cover all possible information. If you have questions about this medicine, talk to your doctor, pharmacist, or health care provider.  2015, Elsevier/Gold Standard. (2012-12-05 14:40:36)  Oxygen Use at Home Oxygen can be prescribed for home use. The prescription will show the flow rate. This is how much oxygen is to be used  per minute. This will be listed in liters per minute (LPM or L/M). A liter is a metric measurement of volume. You will use oxygen therapy as directed. It can be used while exercising, sleeping, or at rest. You may need oxygen continuously. Your health care provider may order a blood oxygen test (arterial blood gas or pulse oximetry test) that will show what your oxygen level is. Your health care provider will use these measurements to learn about your needs and follow your progress. Home oxygen therapy is commonly used on patients with various lung (pulmonary) related conditions. Some of these conditions include:  Asthma.  Lung cancer.  Pneumonia.  Emphysema.  Chronic bronchitis.  Cystic fibrosis.  Other lung diseases.  Pulmonary fibrosis.  Occupational lung disease.  Heart failure.  Chronic obstructive pulmonary disease (COPD). 3 COMMON WAYS OF PROVIDING OXYGEN THERAPY  Gas: The gas form of oxygen is put into variously sized cylinders or tanks. The cylinders or oxygen tanks contain compressed oxygen. The cylinder is equipped with a regulator that controls the flow rate. Because the flow of oxygen out of the cylinder is constant, an oxygen conserving device may be attached to the system to avoid waste. This device releases the gas only when you inhale and cuts it off when you exhale. Oxygen can be provided in a small cylinder that  can be carried with you. Large tanks are heavy and are only for stationary use. After use, empty tanks must be exchanged for full tanks.  Liquid: The liquid form of oxygen is put into a container similar to a thermos. When released, the liquid converts to a gas and you breathe it in just like the compressed gas. This storage method takes up less space than the compressed gas cylinder, and you can transfer the liquid to a small, portable vessel at home. Liquid oxygen is more expensive than the compressed gas, and the vessel vents when not in use. An oxygen  conserving device may be built into the vessel to conserve the oxygen. Liquid oxygen is very cold, around 297 below zero.  Oxygen concentrator: This medical device filters oxygen from room air and gives almost 100% oxygen to the patient. Oxygen concentrators are powered by electricity. Benefits of this system are:  It does not need to be resupplied.  It is not as costly as liquid oxygen.  Extra tubing permits the user to move around easier. There are several types of small, portable oxygen systems available which can help you remain active and mobile. You must have a cylinder of oxygen as a backup in the event of a power failure. Advise your electric power company that you are on oxygen therapy in order to get priority service when there is a power failure. OXYGEN DELIVERY DEVICES There are 3 common ways to deliver oxygen to your body.  Nasal cannula. This is a 2-pronged device inserted in the nostrils that is connected to tubing carrying the oxygen. The tubing can rest on the ears or be attached to the frame of eyeglasses.  Mask. People who need a high flow of oxygen generally use a mask.  Transtracheal catheter. Transtracheal oxygen therapy requires the insertion of a small, flexible tube (catheter) in the windpipe (trachea). This catheter is held in place by a necklace. Since transtracheal oxygen bypasses the mouth, nose, and throat, a humidifier is absolutely required at flow rates of 1 LPM or greater. OXYGEN USE SAFETY TIPS  Never smoke while using oxygen. Oxygen does not burn or explode, but flammable materials will burn faster in the presence of oxygen.  Keep a Data processing manager close by. Let your fire department know that you have oxygen in your home.  Warn visitors not to smoke near you when you are using oxygen. Put up "no smoking" signs in your home where you most often use the oxygen.  When you go to a restaurant with your portable oxygen source, ask to be seated in the  nonsmoking section.  Stay at least 5 feet away from gas stoves, candles, lighted fireplaces, or other heat sources.  Do not use materials that burn easily (flammable) while using your oxygen.  If you use an oxygen cylinder, make sure it is secured to some fixed object or in a stand. If you use liquid oxygen, make sure the vessel is kept upright to keep the oxygen from pouring out. Liquid oxygen is so cold it can hurt your skin.  If you use an oxygen concentrator, call your electric company so you will be given priority service if your power goes out. Avoid using extension cords, if possible.  Regularly test your smoke detectors at home to make sure they work. If you receive care in your home from a nurse or other health care provider, he or she may also check to make sure your smoke detectors work. GUIDELINES FOR CLEANING  YOUR EQUIPMENT  Wash the nasal prongs with a liquid soap. Thoroughly rinse them once or twice a week.  Replace the prongs every 2 to 4 weeks. If you have an infection (cold, pneumonia) change them when you are well.  Your health care provider will give you instructions on how to clean your transtracheal catheter.  The humidifier bottle should be washed with soap and warm water and rinsed thoroughly between each refill. Air-dry the bottle before filling it with sterile or distilled water. The bottle and its top should be disinfected after they are cleaned.  If you use an oxygen concentrator, unplug the unit. Then wipe down the cabinet with a damp cloth and dry it daily. The air filter should be cleaned at least twice a week.  Follow your home medical equipment and service company's directions for cleaning the compressor filter. HOME CARE INSTRUCTIONS   Do not change the flow of oxygen unless directed by your health care provider.  Do not use alcohol or other sedating drugs unless instructed. They slow your breathing rate.  Do not use materials that burn easily  (flammable) while using your oxygen.  Always keep a spare tank of oxygen. Plan ahead for holidays when you may not be able to get a prescription filled.  Use water-based lubricants on your lips or nostrils. Do not use an oil-based product like petroleum jelly.  To prevent your cheeks or the skin behind your ears from becoming irritated, tuck some gauze under the tubing.  If you have persistent redness under your nose, call your health care provider.  When you no longer need oxygen, your doctor will have the oxygen discontinued. Oxygen is not addicting or habit forming.  Use the oxygen as instructed. Too much oxygen can be harmful and too little will not give you the benefit you need.  Shortness of breath is not always from a lack of oxygen. If your oxygen level is not the cause of your shortness of breath, taking oxygen will not help. SEEK MEDICAL CARE IF:   You have frequent headaches.  You have shortness of breath or a lasting cough.  You have anxiety.  You are confused.  You are drowsy or sleepy all the time.  You develop an illness which aggravates your breathing.  You cannot exercise.  You are restless.  You have blue lips or fingernails.  You have difficult or irregular breathing and it is getting worse.  You have a fever. Document Released: 06/23/2003 Document Revised: 08/17/2013 Document Reviewed: 11/12/2012 [facility] Patient Information 2015 [place], [place]. This information is not intended to replace advice given to you by your health care provider. Make sure you discuss any questions you have with your health care provider.  Furosemide tablets What is this medicine? FUROSEMIDE (fyoor [place] se mide) is a diuretic. It helps you make more urine and to lose salt and excess water from your body. This medicine is used to treat high blood pressure, and edema or swelling from heart, kidney, or liver disease. This medicine may be used for other purposes; ask your health care  provider or pharmacist if you have questions. COMMON BRAND NAME(S): Delone, Lasix What should I tell my health care provider before I take this medicine? They need to know if you have any of these conditions: -abnormal blood electrolytes -diarrhea or vomiting -gout -heart disease -kidney disease, small amounts of urine, or difficulty passing urine -liver disease -an unusual or allergic reaction to furosemide, sulfa drugs, other medicines, foods, dyes,  or preservatives -pregnant or trying to get pregnant -breast-feeding How should I use this medicine? Take this medicine by mouth with a glass of water. Follow the directions on the prescription label. You may take this medicine with or without food. If it upsets your stomach, take it with food or milk. Do not take your medicine more often than directed. Remember that you will need to pass more urine after taking this medicine. Do not take your medicine at a time of day that will cause you problems. Do not take at bedtime. Talk to your pediatrician regarding the use of this medicine in children. While this drug may be prescribed for selected conditions, precautions do apply. Overdosage: If you think you have taken too much of this medicine contact a poison control center or emergency room at once. NOTE: This medicine is only for you. Do not share this medicine with others. What if I miss a dose? If you miss a dose, take it as soon as you can. If it is almost time for your next dose, take only that dose. Do not take double or extra doses. What may interact with this medicine? -aspirin and aspirin-like medicines -certain antibiotics -chloral hydrate -cisplatin -cyclosporine -digoxin -diuretics -laxatives -lithium -medicines for blood pressure -medicines that relax muscles for surgery -methotrexate -NSAIDs, medicines for pain and inflammation like ibuprofen, naproxen, or indomethacin -phenytoin -steroid medicines like prednisone or  cortisone -sucralfate This list may not describe all possible interactions. Give your health care provider a list of all the medicines, herbs, non-prescription drugs, or dietary supplements you use. Also tell them if you smoke, drink alcohol, or use illegal drugs. Some items may interact with your medicine. What should I watch for while using this medicine? Visit your doctor or health care professional for regular checks on your progress. Check your blood pressure regularly. Ask your doctor or health care professional what your blood pressure should be, and when you should contact him or her. If you are a diabetic, check your blood sugar as directed. You may need to be on a special diet while taking this medicine. Check with your doctor. Also, ask how many glasses of fluid you need to drink a day. You must not get dehydrated. You may get drowsy or dizzy. Do not drive, use machinery, or do anything that needs mental alertness until you know how this drug affects you. Do not stand or sit up quickly, especially if you are an older patient. This reduces the risk of dizzy or fainting spells. Alcohol can make you more drowsy and dizzy. Avoid alcoholic drinks. This medicine can make you more sensitive to the sun. Keep out of the sun. If you cannot avoid being in the sun, wear protective clothing and use sunscreen. Do not use sun lamps or tanning beds/booths. What side effects may I notice from receiving this medicine? Side effects that you should report to your doctor or health care professional as soon as possible: -blood in urine or stools -dry mouth -fever or chills -hearing loss or ringing in the ears -irregular heartbeat -muscle pain or weakness, cramps -skin rash -stomach upset, pain, or nausea -tingling or numbness in the hands or feet -unusually weak or tired -vomiting or diarrhea -yellowing of the eyes or skin Side effects that usually do not require medical attention (report to your doctor  or health care professional if they continue or are bothersome): -headache -loss of appetite -unusual bleeding or bruising This list may not describe all possible side effects.  Call your doctor for medical advice about side effects. You may report side effects to FDA at 1-800-FDA-1088. Where should I keep my medicine? Keep out of the reach of children. Store at room temperature between 15 and 30 degrees C (59 and 86 degrees F). Protect from light. Throw away any unused medicine after the expiration date. NOTE: This sheet is a summary. It may not cover all possible information. If you have questions about this medicine, talk to your doctor, pharmacist, or health care provider.  2015, Elsevier/Gold Standard. (2009-03-21 16:24:50)   Simvastatin tablets What is this medicine? SIMVASTATIN (SIM va stat in) is known as a HMG-CoA reductase inhibitor or 'statin'. It lowers the level of cholesterol and triglycerides in the blood. This drug may also reduce the risk of heart attack, stroke, or other health problems in patients with risk factors for heart or blood vessel disease. Diet and lifestyle changes are often used with this drug. This medicine may be used for other purposes; ask your health care provider or pharmacist if you have questions. COMMON BRAND NAME(S): Zocor What should I tell my health care provider before I take this medicine? They need to know if you have any of these conditions: -frequently drink alcoholic beverages -kidney disease -liver disease -muscle aches or weakness -other medical condition -an unusual or allergic reaction to simvastatin, other medicines, foods, dyes, or preservatives -pregnant or trying to get pregnant -breast-feeding How should I use this medicine? Take this medicine by mouth with a glass of water. Follow the directions on the prescription label. You can take this medicine with or without food. Take your doses at regular intervals. Do not take your  medicine more often than directed. Talk to your pediatrician regarding the use of this medicine in children. Special care may be needed. Overdosage: If you think you have taken too much of this medicine contact a poison control center or emergency room at once. NOTE: This medicine is only for you. Do not share this medicine with others. What if I miss a dose? If you miss a dose, take it as soon as you can. If it is almost time for your next dose, take only that dose. Do not take double or extra doses. What may interact with this medicine? Do not take this medicine with any of the following medications: -boceprevir -cyclosporine -danazol -gemfibrozil -medicines for fungal infections like itraconazole, ketoconazole, posaconazole, voriconazole -medicines for HIV infection -mifepristone, RU-486 -nefazodone -red yeast rice -some antibiotics like clarithromycin, erythromycin, telithromycin -telaprevir This medicine may also interact with the following medications: -alcohol -amiodarone -amlodipine -colchicine -digoxin -diltiazem -dronedarone -fluconazole -grapefruit juice -niacin -ranolazine -verapamil -warfarin This list may not describe all possible interactions. Give your health care provider a list of all the medicines, herbs, non-prescription drugs, or dietary supplements you use. Also tell them if you smoke, drink alcohol, or use illegal drugs. Some items may interact with your medicine. What should I watch for while using this medicine? Visit your doctor or health care professional for regular check-ups. You may need regular tests to make sure your liver is working properly. Tell your doctor or health care professional right away if you get any unexplained muscle pain, tenderness, or weakness, especially if you also have a fever and tiredness. Your doctor or health care professional may tell you to stop taking this medicine if you develop muscle problems. If your muscle problems  do not go away after stopping this medicine, contact your health care professional. This medicine may affect  blood sugar levels. If you have diabetes, check with your doctor or health care professional before you change your diet or the dose of your diabetic medicine. This drug is only part of a total heart-health program. Your doctor or a dietician can suggest a low-cholesterol and low-fat diet to help. Avoid alcohol and smoking, and keep a proper exercise schedule. Do not use this drug if you are pregnant or breast-feeding. Serious side effects to an unborn child or to an infant are possible. Talk to your doctor or pharmacist for more information. If you are going to have surgery tell your health care professional that you are taking this drug. Some drugs may increase the risk of side effects from simvastatin. If you are given certain antibiotics or antifungals, your doctor or health care professional may stop simvastatin for a short time. Check with your doctor or pharmacist for advice. What side effects may I notice from receiving this medicine? Side effects that you should report to your doctor or health care professional as soon as possible: -allergic reactions like skin rash, itching or hives, swelling of the face, lips, or tongue -confusion -dark urine -fever -joint pain -loss of memory -muscle cramps, pain -redness, blistering, peeling or loosening of the skin, including inside the mouth -trouble passing urine or change in the amount of urine -unusually weak or tired -yellowing of the eyes or skin Side effects that usually do not require medical attention (report to your doctor or health care professional if they continue or are bothersome): -constipation -heartburn -stomach gas, pain, upset This list may not describe all possible side effects. Call your doctor for medical advice about side effects. You may report side effects to FDA at 1-800-FDA-1088. Where should I keep my  medicine? Keep out of the reach of children. Store at room temperature below 30 degrees C (86 degrees F). Keep container tightly closed. Throw away any unused medicine after the expiration date. NOTE: This sheet is a summary. It may not cover all possible information. If you have questions about this medicine, talk to your doctor, pharmacist, or health care provider.  2015, Elsevier/Gold Standard. (2011-02-20 10:40:36)

## 2015-01-25 ENCOUNTER — Encounter: Payer: Self-pay | Admitting: Nurse Practitioner

## 2015-01-26 ENCOUNTER — Ambulatory Visit: Payer: Self-pay | Admitting: Family

## 2015-01-31 ENCOUNTER — Ambulatory Visit: Payer: Self-pay

## 2015-02-07 ENCOUNTER — Ambulatory Visit: Payer: Self-pay

## 2015-02-25 ENCOUNTER — Inpatient Hospital Stay: Payer: Self-pay | Admitting: Internal Medicine

## 2015-02-27 ENCOUNTER — Encounter: Payer: Self-pay | Admitting: Physician Assistant

## 2015-02-27 DIAGNOSIS — I519 Heart disease, unspecified: Secondary | ICD-10-CM | POA: Insufficient documentation

## 2015-02-28 ENCOUNTER — Encounter: Payer: Self-pay | Admitting: Family

## 2015-02-28 ENCOUNTER — Encounter: Payer: Self-pay | Admitting: *Deleted

## 2015-02-28 ENCOUNTER — Telehealth: Payer: Self-pay | Admitting: *Deleted

## 2015-02-28 ENCOUNTER — Ambulatory Visit: Payer: Self-pay

## 2015-02-28 ENCOUNTER — Encounter: Payer: Self-pay | Admitting: Physician Assistant

## 2015-02-28 ENCOUNTER — Inpatient Hospital Stay: Payer: Self-pay | Admitting: Internal Medicine

## 2015-02-28 ENCOUNTER — Ambulatory Visit: Payer: Self-pay | Attending: Family | Admitting: Family

## 2015-02-28 VITALS — BP 142/90 | HR 79 | Resp 20 | Ht 76.0 in | Wt 391.0 lb

## 2015-02-28 DIAGNOSIS — I10 Essential (primary) hypertension: Secondary | ICD-10-CM | POA: Insufficient documentation

## 2015-02-28 DIAGNOSIS — R0602 Shortness of breath: Secondary | ICD-10-CM | POA: Insufficient documentation

## 2015-02-28 DIAGNOSIS — G4730 Sleep apnea, unspecified: Secondary | ICD-10-CM | POA: Insufficient documentation

## 2015-02-28 DIAGNOSIS — F17210 Nicotine dependence, cigarettes, uncomplicated: Secondary | ICD-10-CM | POA: Insufficient documentation

## 2015-02-28 DIAGNOSIS — G4733 Obstructive sleep apnea (adult) (pediatric): Secondary | ICD-10-CM | POA: Insufficient documentation

## 2015-02-28 DIAGNOSIS — F17200 Nicotine dependence, unspecified, uncomplicated: Secondary | ICD-10-CM

## 2015-02-28 DIAGNOSIS — I5032 Chronic diastolic (congestive) heart failure: Secondary | ICD-10-CM | POA: Insufficient documentation

## 2015-02-28 DIAGNOSIS — I5033 Acute on chronic diastolic (congestive) heart failure: Secondary | ICD-10-CM | POA: Insufficient documentation

## 2015-02-28 DIAGNOSIS — R0989 Other specified symptoms and signs involving the circulatory and respiratory systems: Secondary | ICD-10-CM

## 2015-02-28 DIAGNOSIS — Z6841 Body Mass Index (BMI) 40.0 and over, adult: Secondary | ICD-10-CM | POA: Insufficient documentation

## 2015-02-28 DIAGNOSIS — Z7982 Long term (current) use of aspirin: Secondary | ICD-10-CM | POA: Insufficient documentation

## 2015-02-28 DIAGNOSIS — Z79899 Other long term (current) drug therapy: Secondary | ICD-10-CM | POA: Insufficient documentation

## 2015-02-28 MED ORDER — ASPIRIN 81 MG PO CHEW: 81.0000 mg | CHEWABLE_TABLET | Freq: Every day | ORAL | Status: DC

## 2015-02-28 MED ORDER — SIMVASTATIN 10 MG PO TABS: 10.0000 mg | ORAL_TABLET | Freq: Every day | ORAL | Status: DC

## 2015-02-28 MED ORDER — FUROSEMIDE 40 MG PO TABS: 40.0000 mg | ORAL_TABLET | Freq: Every day | ORAL | Status: DC

## 2015-02-28 MED ORDER — METOPROLOL TARTRATE 25 MG PO TABS: 25.0000 mg | ORAL_TABLET | Freq: Two times a day (BID) | ORAL | Status: DC

## 2015-02-28 MED ORDER — LOSARTAN POTASSIUM 25 MG PO TABS: 25.0000 mg | ORAL_TABLET | Freq: Every day | ORAL | Status: DC

## 2015-02-28 MED ORDER — DOCUSATE SODIUM 100 MG PO CAPS: 100.0000 mg | ORAL_CAPSULE | Freq: Every day | ORAL | Status: DC | PRN

## 2015-02-28 NOTE — Telephone Encounter (Signed)
[name] Catalina from medication management calling to  to find out if pt recieved charity paper work  to help with medication and medical bills.  When asked her about if we did not how would get him started she was not sure, stated it goes through our billing.  Please advise
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
## 2015-02-28 NOTE — Telephone Encounter (Signed)
Left message for [name] Catalina that pt has not been seen in our office; did not show up for appt w/ Thurmond Butts today.

## 2015-02-28 NOTE — Patient Instructions (Signed)
Continue weighing daily and call for an overnight weight gain of > 2 pounds or a weekly weight gain of >5 pounds. 

## 2015-03-01 ENCOUNTER — Encounter: Payer: Self-pay | Admitting: *Deleted

## 2015-03-17 ENCOUNTER — Encounter: Payer: Self-pay | Admitting: Pharmacist

## 2015-03-30 ENCOUNTER — Ambulatory Visit: Payer: Self-pay | Admitting: Family

## 2015-04-05 ENCOUNTER — Ambulatory Visit: Payer: Self-pay | Admitting: Family

## 2015-04-14 ENCOUNTER — Encounter: Payer: Self-pay | Admitting: Physician Assistant

## 2015-04-21 ENCOUNTER — Ambulatory Visit: Payer: Self-pay | Admitting: Family

## 2015-04-28 ENCOUNTER — Ambulatory Visit: Payer: Self-pay | Admitting: Family

## 2015-04-28 ENCOUNTER — Telehealth: Payer: Self-pay | Admitting: Family

## 2015-04-28 NOTE — Telephone Encounter (Signed)
Patient did not show for his appointment at the [facility] on 04/28/15. Will attempt to reschedule.

## 2015-11-15 ENCOUNTER — Ambulatory Visit: Payer: Self-pay

## 2017-04-12 ENCOUNTER — Encounter: Payer: Self-pay | Admitting: Emergency Medicine

## 2017-04-12 ENCOUNTER — Inpatient Hospital Stay
Admission: EM | Admit: 2017-04-12 | Discharge: 2017-04-15 | DRG: 292 | Disposition: A | Discharge status: Home / Self Care | Payer: Medicaid Other | Attending: Internal Medicine | Admitting: Internal Medicine

## 2017-04-12 ENCOUNTER — Other Ambulatory Visit: Payer: Self-pay

## 2017-04-12 ENCOUNTER — Emergency Department: Payer: Medicaid Other

## 2017-04-12 DIAGNOSIS — I110 Hypertensive heart disease with heart failure: Secondary | ICD-10-CM | POA: Diagnosis present

## 2017-04-12 DIAGNOSIS — Z9111 Patient's noncompliance with dietary regimen: Secondary | ICD-10-CM | POA: Diagnosis not present

## 2017-04-12 DIAGNOSIS — Z23 Encounter for immunization: Secondary | ICD-10-CM | POA: Diagnosis not present

## 2017-04-12 DIAGNOSIS — Z9114 Patient's other noncompliance with medication regimen: Secondary | ICD-10-CM

## 2017-04-12 DIAGNOSIS — Z6841 Body Mass Index (BMI) 40.0 and over, adult: Secondary | ICD-10-CM

## 2017-04-12 DIAGNOSIS — Z7982 Long term (current) use of aspirin: Secondary | ICD-10-CM

## 2017-04-12 DIAGNOSIS — F17210 Nicotine dependence, cigarettes, uncomplicated: Secondary | ICD-10-CM | POA: Diagnosis present

## 2017-04-12 DIAGNOSIS — Z79899 Other long term (current) drug therapy: Secondary | ICD-10-CM | POA: Diagnosis not present

## 2017-04-12 DIAGNOSIS — I509 Heart failure, unspecified: Secondary | ICD-10-CM

## 2017-04-12 DIAGNOSIS — R0602 Shortness of breath: Secondary | ICD-10-CM | POA: Diagnosis present

## 2017-04-12 DIAGNOSIS — I5043 Acute on chronic combined systolic (congestive) and diastolic (congestive) heart failure: Principal | ICD-10-CM | POA: Diagnosis present

## 2017-04-12 DIAGNOSIS — J811 Chronic pulmonary edema: Secondary | ICD-10-CM

## 2017-04-12 DIAGNOSIS — G4733 Obstructive sleep apnea (adult) (pediatric): Secondary | ICD-10-CM | POA: Diagnosis present

## 2017-04-12 DIAGNOSIS — I5023 Acute on chronic systolic (congestive) heart failure: Secondary | ICD-10-CM

## 2017-04-12 DIAGNOSIS — E876 Hypokalemia: Secondary | ICD-10-CM | POA: Diagnosis present

## 2017-04-12 LAB — COMPREHENSIVE METABOLIC PANEL
ALT: 13 U/L — ABNORMAL LOW (ref 17–63)
AST: 15 U/L (ref 15–41)
Albumin: 3.1 g/dL — ABNORMAL LOW (ref 3.5–5.0)
Alkaline Phosphatase: 80 U/L (ref 38–126)
Anion gap: 4 — ABNORMAL LOW (ref 5–15)
BUN: 18 mg/dL (ref 6–20)
CO2: 37 mmol/L — ABNORMAL HIGH (ref 22–32)
Calcium: 8 mg/dL — ABNORMAL LOW (ref 8.9–10.3)
Chloride: 99 mmol/L — ABNORMAL LOW (ref 101–111)
Creatinine, Ser: 1.05 mg/dL (ref 0.61–1.24)
GFR calc Af Amer: >60 mL/min (ref >60)
GFR calc non Af Amer: >60 mL/min (ref >60)
Glucose, Bld: 89 mg/dL (ref 65–99)
Potassium: 3.2 mmol/L — ABNORMAL LOW (ref 3.5–5.1)
Sodium: 140 mmol/L (ref 135–145)
Total Bilirubin: 1 mg/dL (ref 0.3–1.2)
Total Protein: 7.3 g/dL (ref 6.5–8.1)

## 2017-04-12 LAB — CBC WITH DIFFERENTIAL/PLATELET
Basophils Absolute: 0 10*3/uL (ref 0–0.1)
Basophils Relative: 1 %
Eosinophils Absolute: 0.1 10*3/uL (ref 0–0.7)
Eosinophils Relative: 1 %
HCT: 46.8 % (ref 40.0–52.0)
Hemoglobin: 13.6 g/dL (ref 13.0–18.0)
Lymphocytes Relative: 21 %
Lymphs Abs: 1.2 10*3/uL (ref 1.0–3.6)
MCH: 21.5 pg — ABNORMAL LOW (ref 26.0–34.0)
MCHC: 29.2 g/dL — ABNORMAL LOW (ref 32.0–36.0)
MCV: 73.6 fL — ABNORMAL LOW (ref 80.0–100.0)
Monocytes Absolute: 0.6 10*3/uL (ref 0.2–1.0)
Monocytes Relative: 10 %
Neutro Abs: 4 10*3/uL (ref 1.4–6.5)
Neutrophils Relative %: 67 %
Platelets: 133 10*3/uL — ABNORMAL LOW (ref 150–440)
RBC: 6.36 MIL/uL — ABNORMAL HIGH (ref 4.40–5.90)
RDW: 17.6 % — ABNORMAL HIGH (ref 11.5–14.5)
WBC: 5.9 10*3/uL (ref 3.8–10.6)

## 2017-04-12 LAB — MAGNESIUM: Magnesium: 2.2 mg/dL (ref 1.7–2.4)

## 2017-04-12 LAB — BRAIN NATRIURETIC PEPTIDE: B Natriuretic Peptide: 657 pg/mL — ABNORMAL HIGH (ref 0.0–100.0)

## 2017-04-12 MED ORDER — POTASSIUM CHLORIDE CRYS ER 20 MEQ PO TBCR
40.0000 meq | EXTENDED_RELEASE_TABLET | Freq: Once | ORAL | Status: AC
  Administered 2017-04-12: 40 meq via ORAL
  Filled 2017-04-12: qty 2

## 2017-04-12 MED ORDER — ONDANSETRON HCL 4 MG PO TABS: 4.0000 mg | ORAL_TABLET | Freq: Four times a day (QID) | ORAL | Status: DC | PRN

## 2017-04-12 MED ORDER — POLYETHYLENE GLYCOL 3350 17 G PO PACK: 17.0000 g | PACK | Freq: Every day | ORAL | Status: DC | PRN

## 2017-04-12 MED ORDER — SENNA 8.6 MG PO TABS
1.0000 | ORAL_TABLET | Freq: Two times a day (BID) | ORAL | Status: DC
  Administered 2017-04-12 – 2017-04-15 (×5): 8.6 mg via ORAL
  Filled 2017-04-12 (×7): qty 1

## 2017-04-12 MED ORDER — INFLUENZA VAC SPLIT QUAD 0.5 ML IM SUSY
0.5000 mL | PREFILLED_SYRINGE | INTRAMUSCULAR | Status: AC
  Administered 2017-04-13: 0.5 mL via INTRAMUSCULAR
  Filled 2017-04-12: qty 0.5

## 2017-04-12 MED ORDER — ONDANSETRON HCL 4 MG/2ML IJ SOLN: 4.0000 mg | Freq: Four times a day (QID) | INTRAMUSCULAR | Status: DC | PRN

## 2017-04-12 MED ORDER — POTASSIUM CHLORIDE CRYS ER 20 MEQ PO TBCR
40.0000 meq | EXTENDED_RELEASE_TABLET | Freq: Two times a day (BID) | ORAL | Status: DC
  Administered 2017-04-12 – 2017-04-13 (×3): 40 meq via ORAL
  Filled 2017-04-12 (×3): qty 2

## 2017-04-12 MED ORDER — ALBUTEROL SULFATE (2.5 MG/3ML) 0.083% IN NEBU: 2.5000 mg | INHALATION_SOLUTION | Freq: Four times a day (QID) | RESPIRATORY_TRACT | Status: DC | PRN

## 2017-04-12 MED ORDER — FUROSEMIDE 10 MG/ML IJ SOLN
40.0000 mg | Freq: Three times a day (TID) | INTRAMUSCULAR | Status: DC
  Administered 2017-04-12 – 2017-04-14 (×6): 40 mg via INTRAVENOUS
  Filled 2017-04-12 (×6): qty 4

## 2017-04-12 MED ORDER — LISINOPRIL 5 MG PO TABS
5.0000 mg | ORAL_TABLET | Freq: Every day | ORAL | Status: DC
  Administered 2017-04-12 – 2017-04-15 (×4): 5 mg via ORAL
  Filled 2017-04-12 (×4): qty 1

## 2017-04-12 MED ORDER — ENOXAPARIN SODIUM 40 MG/0.4ML ~~LOC~~ SOLN
40.0000 mg | SUBCUTANEOUS | Status: DC
  Administered 2017-04-12 – 2017-04-13 (×2): 40 mg via SUBCUTANEOUS
  Filled 2017-04-12 (×2): qty 0.4

## 2017-04-12 MED ORDER — CARVEDILOL 6.25 MG PO TABS
6.2500 mg | ORAL_TABLET | Freq: Two times a day (BID) | ORAL | Status: DC
  Administered 2017-04-12 – 2017-04-15 (×6): 6.25 mg via ORAL
  Filled 2017-04-12 (×6): qty 1

## 2017-04-12 MED ORDER — FUROSEMIDE 10 MG/ML IJ SOLN
40.0000 mg | Freq: Once | INTRAMUSCULAR | Status: AC
  Administered 2017-04-12: 40 mg via INTRAVENOUS
  Filled 2017-04-12: qty 4

## 2017-04-12 MED ORDER — ACETAMINOPHEN 650 MG RE SUPP: 650.0000 mg | Freq: Four times a day (QID) | RECTAL | Status: DC | PRN

## 2017-04-12 MED ORDER — ACETAMINOPHEN 325 MG PO TABS
650.0000 mg | ORAL_TABLET | Freq: Four times a day (QID) | ORAL | Status: DC | PRN
Start: 2017-04-12 — End: 2017-04-15

## 2017-04-12 NOTE — Progress Notes (Addendum)
MEDICATION RELATED CONSULT NOTE - Electrolyte Management    Pharmacy consulted for electrolyte management for 61 yo male admitted with CHF exacerbation. Patient ordered furosemide 40mg  IV x 1 in ED and is scheduled furosemide 40mg  IV Q8hr. Goal potassium ~ 4, goal magnesium ~ 2.   Plan:  Will order potassium 58mEq PO x 2 and continue potassium 60mEq PO BID x 4 doses. As patient is being diuresed, will need to replace electrolytes orally. Will check potassium daily while patient is receiving IV furosemide.   No Known Allergies  Patient Measurements: Height: 6\' 3"  (190.5 cm) Weight: (!) 365 lb (165.6 kg) IBW/kg (Calculated) : 84.5  Vital Signs: Temp: 98.7 F (37.1 C) (12/28 1025) Temp Source: Oral (12/28 1025) BP: 130/70 (12/28 1302) Pulse Rate: 48 (12/28 1302) Intake/Output from previous day: No intake/output data recorded. Intake/Output from this shift: No intake/output data recorded.  Labs: Recent Labs    04/12/17 1045  WBC 5.9  HGB 13.6  HCT 46.8  PLT 133*  CREATININE 1.05  MG 2.2  ALBUMIN 3.1*  PROT 7.3  AST 15  ALT 13*  ALKPHOS 80  BILITOT 1.0   Estimated Creatinine Clearance: 125.3 mL/min (by C-G formula based on SCr of 1.05 mg/dL).   Pharmacy will continue to monitor and adjust per consult.  Waleed Dettman L 04/12/2017,1:57 PM
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
## 2017-04-12 NOTE — Significant Event (Signed)
MD on call notified by this RN that pt converted into bigeminy on monitor. No new orders at this time.

## 2017-04-12 NOTE — ED Triage Notes (Addendum)
Pt c/o leg swelling for past month. Has had this in past once and came to ED and he reports they removed it and he was fine.  Pt has had noted SHOB as well mostly with exertion. Denies pain. Unlabored in triage. At this time. In bigemeny

## 2017-04-12 NOTE — Plan of Care (Signed)
Pt admitted this shift from ED . A&Ox4. VSS. 2L O2 [place] . OOB with standby assist. IV lasix given per order, pt voiding in urinal. NSR on monitor. No complaints thus far. Will continue to monitor and report to oncoming RN .  Progressing Education: Knowledge of General Education information will improve 04/12/2017 1514 - Progressing by Aleen Campi, RN Health Behavior/Discharge Planning: Ability to manage health-related needs will improve 04/12/2017 1514 - Progressing by Aleen Campi, RN Clinical Measurements: Ability to maintain clinical measurements within normal limits will improve 04/12/2017 1514 - Progressing by Aleen Campi, RN Will remain free from infection 04/12/2017 1514 - Progressing by Aleen Campi, RN Diagnostic test results will improve 04/12/2017 1514 - Progressing by Aleen Campi, RN Respiratory complications will improve 04/12/2017 1514 - Progressing by Aleen Campi, RN Cardiovascular complication will be avoided 04/12/2017 1514 - Progressing by Aleen Campi, RN Activity: Risk for activity intolerance will decrease 04/12/2017 1514 - Progressing by Aleen Campi, RN Nutrition: Adequate nutrition will be maintained 04/12/2017 1514 - Progressing by Aleen Campi, RN Coping: Level of anxiety will decrease 04/12/2017 1514 - Progressing by Aleen Campi, RN Elimination: Will not experience complications related to bowel motility 04/12/2017 1514 - Progressing by Aleen Campi, RN Will not experience complications related to urinary retention 04/12/2017 1514 - Progressing by Aleen Campi, RN Pain Managment: General experience of comfort will improve 04/12/2017 1514 - Progressing by Aleen Campi, RN Safety: Ability to remain free from injury will improve 04/12/2017 1514 - Progressing by Aleen Campi, RN Skin Integrity: Risk for impaired skin integrity will decrease 04/12/2017 1514 - Progressing by Aleen Campi, RN

## 2017-04-12 NOTE — Progress Notes (Signed)
Pt. Refuses bed alarm, and also insist bed be elevated because he is tall. Pt educated on the risk of bed not being in low position and also educated on the need for bed alarms at night. Pt continued to refuse bed alarm. Will continue to monitor pt.

## 2017-04-12 NOTE — Care Management Note (Addendum)
Case Management Note  Patient Details  Name: Gary [name] MRN: [id number] Date of Birth: 12/03/1955  Subjective/Objective:                 Patient is admitted with congestive heart failure.  This CM followed him two years ago for same diagnosis.He has no payor. 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  Was set up  with [employer] 2016 for oxygen under charity care. Says he still has it but "I never used it because when I got home it did not work." Says he did not think to call [employer] and report the problem. Was given a set of scales and he confirms he still has them and will start weighing every day.  Was referred to [facility] but patient says "I do not even known  what that is." Patient said he received medications through the [facility] for about one year. Admits to not being seen at [facility].  Will provide patient with additional applications.  If he completed the application process for [facility] , he is over due for an annual evaluation.  Have reached out to [facility] to determine if patient completed any portion of the application process   Recevied response from [facility] and patient did not follow through with application process.  Action/Plan:   Expected Discharge Date:                  Expected Discharge Plan:     In-House Referral:     Discharge planning Services     Post Acute Care Choice:    Choice offered to:     DME Arranged:    DME Agency:     HH Arranged:    HH Agency:     Status of Service:     If discussed at [employer] of Stay Meetings, dates discussed:    Additional Comments:  Katrina Stack, RN 04/12/2017, 3:54 PM
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
## 2017-04-13 ENCOUNTER — Inpatient Hospital Stay: Admit: 2017-04-13 | Payer: Medicaid Other

## 2017-04-13 LAB — BASIC METABOLIC PANEL
Anion gap: 5 (ref 5–15)
BUN: 20 mg/dL (ref 6–20)
CO2: 35 mmol/L — ABNORMAL HIGH (ref 22–32)
Calcium: 8 mg/dL — ABNORMAL LOW (ref 8.9–10.3)
Chloride: 99 mmol/L — ABNORMAL LOW (ref 101–111)
Creatinine, Ser: 1.08 mg/dL (ref 0.61–1.24)
GFR calc Af Amer: >60 mL/min (ref >60)
GFR calc non Af Amer: >60 mL/min (ref >60)
Glucose, Bld: 96 mg/dL (ref 65–99)
Potassium: 4.3 mmol/L (ref 3.5–5.1)
Sodium: 139 mmol/L (ref 135–145)

## 2017-04-13 LAB — MAGNESIUM: Magnesium: 2 mg/dL (ref 1.7–2.4)

## 2017-04-13 LAB — PHOSPHORUS: Phosphorus: 3.7 mg/dL (ref 2.5–4.6)

## 2017-04-13 MED ORDER — ENOXAPARIN SODIUM 40 MG/0.4ML ~~LOC~~ SOLN
40.0000 mg | Freq: Two times a day (BID) | SUBCUTANEOUS | Status: DC
  Administered 2017-04-14 – 2017-04-15 (×3): 40 mg via SUBCUTANEOUS
  Filled 2017-04-13 (×3): qty 0.4

## 2017-04-13 MED ORDER — SPIRONOLACTONE 25 MG PO TABS
25.0000 mg | ORAL_TABLET | Freq: Every day | ORAL | Status: DC
  Administered 2017-04-13 – 2017-04-15 (×3): 25 mg via ORAL
  Filled 2017-04-13 (×3): qty 1

## 2017-04-13 MED ORDER — SODIUM CHLORIDE 0.9% FLUSH
3.0000 mL | Freq: Two times a day (BID) | INTRAVENOUS | Status: DC
  Administered 2017-04-13 – 2017-04-15 (×5): 3 mL via INTRAVENOUS

## 2017-04-13 NOTE — Progress Notes (Signed)
Pt. Slept in the recliner throughout the night. Pt. Did not wear nasal cannula, sats stable. Will continue to monitor pt.

## 2017-04-13 NOTE — Progress Notes (Signed)
MEDICATION RELATED CONSULT NOTE - Electrolyte Management    Pharmacy consulted for electrolyte management for 61 yo male admitted with CHF exacerbation. Patient ordered furosemide 40mg  IV x 1 in ED and is scheduled furosemide 40mg  IV Q8hr. Goal potassium ~ 4, goal magnesium ~ 2.   Plan:  K=4.3 Mg=2, phos=3.7 Continue KCL 40 MEQ BID while pt is being aggressively diuresed. Recheck K, Mg tomorrow AM   No Known Allergies  Patient Measurements: Height: 6\' 3"  (190.5 cm) Weight: (!) 388 lb (176 kg) IBW/kg (Calculated) : 84.5  Vital Signs: Temp: 98.7 F (37.1 C) (12/29 0330) Temp Source: Oral (12/29 0330) BP: 136/84 (12/29 0330) Pulse Rate: 78 (12/29 0330) Intake/Output from previous day: 12/28 0701 - 12/29 0700 In: 460 [P.O.:460] Out: 4050 [Urine:4050] Intake/Output from this shift: No intake/output data recorded.  Labs: Recent Labs    04/12/17 1045 04/13/17 0438  WBC 5.9  --   HGB 13.6  --   HCT 46.8  --   PLT 133*  --   CREATININE 1.05 1.08  MG 2.2 2.0  PHOS  --  3.7  ALBUMIN 3.1*  --   PROT 7.3  --   AST 15  --   ALT 13*  --   ALKPHOS 80  --   BILITOT 1.0  --    Estimated Creatinine Clearance: 126.1 mL/min (by C-G formula based on SCr of 1.08 mg/dL).   Pharmacy will continue to monitor and adjust per consult.  Ramond Dial, Pharm.D, BCPS Clinical Pharmacist  04/13/2017,7:26 AM
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
## 2017-04-13 NOTE — Progress Notes (Signed)
[facility] [place] NAME: Gary [name]    MR#:  [id number]  DATE OF BIRTH:  1956-03-21  SUBJECTIVE:   Patient still with lower extremity edema, increasing abdominal girth and short of breath. However he does feel somewhat better than yesterday.  REVIEW OF SYSTEMS:    Review of Systems  Constitutional: Negative for fever, chills weight loss HENT: Negative for ear pain, nosebleeds, congestion, facial swelling, rhinorrhea, neck pain, neck stiffness and ear discharge.   Respiratory: Negative for cough, positive shortness of breath, no wheezing  Cardiovascular: Negative for chest pain, palpitations and positive leg swelling.  Positive PND and orthopnea Gastrointestinal: Negative for heartburn, abdominal pain, vomiting, diarrhea or consitpation Genitourinary: Negative for dysuria, urgency, frequency, hematuria Musculoskeletal: Negative for back pain or joint pain Neurological: Negative for dizziness, seizures, syncope, focal weakness,  numbness and headaches.  Hematological: Does not bruise/bleed easily.  Psychiatric/Behavioral: Negative for hallucinations, confusion, dysphoric mood    Tolerating Diet: yes      DRUG ALLERGIES:  No Known Allergies  VITALS:  Blood pressure 136/84, pulse 78, temperature 98.7 F (37.1 C), temperature source Oral, resp. rate 17, height 6\' 3"  (1.905 m), weight (!) 176 kg (388 lb), SpO2 92 %.  PHYSICAL EXAMINATION:  Constitutional: Appears morbidly obese started distress  HENT: Normocephalic. [name] Oropharynx is clear and moist.  Eyes: Conjunctivae and EOM are normal. PERRLA, no scleral icterus.  Neck: Normal ROM. Neck supple. No JVD. No tracheal deviation. CVS: Distant heart sounds RRR, S1/S2 +, no murmurs, no gallops, no carotid bruit.  Pulmonary: Effort and breath sounds normal, no stridor, rhonchi, wheezes, rales.  Abdominal: Distended abdomen without tenderness, rebound or guarding.  Musculoskeletal:  Normal range of motion. 4+ lower extremity edema all the way to mid thigh  Neuro: Alert. CN 2-12 grossly intact. No focal deficits. Skin: Skin is warm and dry. No rash noted. Psychiatric: Normal mood and affect.      LABORATORY PANEL:   CBC Recent Labs  Lab 04/12/17 1045  WBC 5.9  HGB 13.6  HCT 46.8  PLT 133*   ------------------------------------------------------------------------------------------------------------------  Chemistries  Recent Labs  Lab 04/12/17 1045 04/13/17 0438  NA 140 139  K 3.2* 4.3  CL 99* 99*  CO2 37* 35*  GLUCOSE 89 96  BUN 18 20  CREATININE 1.05 1.08  CALCIUM 8.0* 8.0*  MG 2.2 2.0  AST 15  --   ALT 13*  --   ALKPHOS 80  --   BILITOT 1.0  --    ------------------------------------------------------------------------------------------------------------------  Cardiac Enzymes No results for input(s): TROPONINI in the last 168 hours. ------------------------------------------------------------------------------------------------------------------  RADIOLOGY:  Dg Chest 2 View  Result Date: 04/12/2017 CLINICAL DATA:  Bilateral leg swelling. EXAM: CHEST  2 VIEW COMPARISON:  Chest x-ray 01/10/2015 . FINDINGS: Cardiomegaly with pulmonary vascular prominence and bilateral interstitial prominence consistent CHF. No pleural effusion or pneumothorax. No acute bony abnormality . IMPRESSION: Cardiomegaly with pulmonary venous congestion bilateral interstitial prominence consistent CHF. Electronically Signed   By: Marcello Moores  Register   On: 04/12/2017 12:14     ASSESSMENT AND PLAN:    61 year old morbidly obese male with history of chronic systolic heart failure ejection fraction 40-45% who presents with shortness of breath and lower extremity edema.  1. Acute on chronic systolic heart failure with ejection fraction 40-45% Continue IV Lasix 40 mg 3 times a day Monitor intake and output with daily weight CHF clinic upon discharge Continue Coreg and  lisinopril Cardiology consultation requested more  so-so patient will have outpatient follow-up  2.Tobacco dependence: Patient is encouraged to quit smoking. Counseling was provided for 4 minutes.  3. Morbid obesity: Encouraged weight loss and diet discretion  4. Essential hypertension: Continue Coreg and lisinopril  5. Probable OSA: Patient will need outpatient sleep evaluation   Management plans discussed with the patient and he is in agreement.  CODE STATUS: full  TOTAL TIME TAKING CARE OF THIS PATIENT: 30 minutes.     POSSIBLE D/C 2-3 days, DEPENDING ON CLINICAL CONDITION.   Victory Strollo M.D on 04/13/2017 at 8:21 AM  Between 7am to 6pm - Pager - [phone] After 6pm go to [email or web address] - password [email or web address] [facility]  Office  [phone]  CC: Primary care physician; Patient, No Pcp Per  Note: This dictation was prepared with Dragon dictation along with smaller phrase technology. Any transcriptional errors that result from this process are unintentional.

## 2017-04-13 NOTE — Progress Notes (Signed)
lovenox dose changed to 40 BID due to BMI >40  Laresa Oshiro D Taryn Nave, Pharm.D, BCPS Clinical Pharmacist

## 2017-04-13 NOTE — Progress Notes (Signed)
Pt's HR has run bigeminy and SR with 1 episode to trigeminy. Pt. Alert and oriented with no c/o SOB, acute distress or pain noted. MD aware, no new orders will continue to monitor pt.

## 2017-04-14 ENCOUNTER — Inpatient Hospital Stay: Admit: 2017-04-14 | Payer: Medicaid Other

## 2017-04-14 ENCOUNTER — Inpatient Hospital Stay
Admit: 2017-04-14 | Discharge: 2017-04-14 | Disposition: A | Discharge status: Home / Self Care | Payer: Medicaid Other | Attending: Internal Medicine | Admitting: Internal Medicine

## 2017-04-14 LAB — BASIC METABOLIC PANEL
Anion gap: 5 (ref 5–15)
BUN: 19 mg/dL (ref 6–20)
CO2: 36 mmol/L — ABNORMAL HIGH (ref 22–32)
Calcium: 8.2 mg/dL — ABNORMAL LOW (ref 8.9–10.3)
Chloride: 99 mmol/L — ABNORMAL LOW (ref 101–111)
Creatinine, Ser: 1.04 mg/dL (ref 0.61–1.24)
GFR calc Af Amer: >60 mL/min (ref >60)
GFR calc non Af Amer: >60 mL/min (ref >60)
Glucose, Bld: 98 mg/dL (ref 65–99)
Potassium: 4.5 mmol/L (ref 3.5–5.1)
Sodium: 140 mmol/L (ref 135–145)

## 2017-04-14 LAB — ECHOCARDIOGRAM COMPLETE
Height: 75 in
Weight: 6162 oz

## 2017-04-14 LAB — MAGNESIUM: Magnesium: 1.9 mg/dL (ref 1.7–2.4)

## 2017-04-14 MED ORDER — FUROSEMIDE 10 MG/ML IJ SOLN
60.0000 mg | Freq: Three times a day (TID) | INTRAMUSCULAR | Status: DC
  Administered 2017-04-14 – 2017-04-15 (×3): 60 mg via INTRAVENOUS
  Filled 2017-04-14 (×3): qty 6

## 2017-04-14 MED ORDER — POTASSIUM CHLORIDE CRYS ER 20 MEQ PO TBCR
40.0000 meq | EXTENDED_RELEASE_TABLET | Freq: Every day | ORAL | Status: DC
  Administered 2017-04-14 – 2017-04-15 (×2): 40 meq via ORAL
  Filled 2017-04-14 (×2): qty 2

## 2017-04-14 MED ORDER — MAGNESIUM OXIDE 400 (241.3 MG) MG PO TABS
400.0000 mg | ORAL_TABLET | Freq: Every day | ORAL | Status: DC
  Administered 2017-04-14 – 2017-04-15 (×2): 400 mg via ORAL
  Filled 2017-04-14 (×2): qty 1

## 2017-04-14 NOTE — Plan of Care (Signed)
  Progressing Clinical Measurements: Respiratory complications will improve 04/14/2017 1117 - Progressing by Liliane Channel, RN 04/14/2017 1114 - Progressing by Liliane Channel, RN Safety: Ability to remain free from injury will improve 04/14/2017 1117 - Progressing by Liliane Channel, RN Skin Integrity: Risk for impaired skin integrity will decrease 04/14/2017 1117 - Progressing by Liliane Channel, RN

## 2017-04-14 NOTE — Progress Notes (Signed)
[facility] [place] NAME: Gary [name]    MR#:  [id number]  DATE OF BIRTH:  1955-07-22  SUBJECTIVE:   Patient has diuresed 9 L. Still with lower extremity edema  REVIEW OF SYSTEMS:    Review of Systems  Constitutional: Negative for fever, chills weight loss HENT: Negative for ear pain, nosebleeds, congestion, facial swelling, rhinorrhea, neck pain, neck stiffness and ear discharge.   Respiratory: Negative for cough, denies shortness of breath, no wheezing  Cardiovascular: Negative for chest pain, palpitations and positive leg swelling.  Denies PND and orthopnea Gastrointestinal: Negative for heartburn, abdominal pain, vomiting, diarrhea or consitpation Genitourinary: Negative for dysuria, urgency, frequency, hematuria Musculoskeletal: Negative for back pain or joint pain Neurological: Negative for dizziness, seizures, syncope, focal weakness,  numbness and headaches.  Hematological: Does not bruise/bleed easily.  Psychiatric/Behavioral: Negative for hallucinations, confusion, dysphoric mood    Tolerating Diet: yes      DRUG ALLERGIES:  No Known Allergies  VITALS:  Blood pressure (!) 138/92, pulse 70, temperature 98.4 F (36.9 C), temperature source Oral, resp. rate 18, height 6\' 3"  (1.905 m), weight (!) 174.7 kg (385 lb 2 oz), SpO2 91 %.  PHYSICAL EXAMINATION:  Constitutional: Appears morbidly obese started distress  HENT: Normocephalic. [name] Oropharynx is clear and moist.  Eyes: Conjunctivae and EOM are normal. PERRLA, no scleral icterus.  Neck: Normal ROM. Neck supple. No JVD. No tracheal deviation. CVS: Distant heart sounds RRR, S1/S2 +, no murmurs, no gallops, no carotid bruit.  Pulmonary: Effort and breath sounds normal, no stridor, rhonchi, wheezes, rales.  Abdominal: Distended abdomen without tenderness, rebound or guarding.  Musculoskeletal: Normal range of motion. 4+ lower extremity edema all the way to mid thigh   Neuro: Alert. CN 2-12 grossly intact. No focal deficits. Skin: Skin is warm and dry. No rash noted. Psychiatric: Normal mood and affect.      LABORATORY PANEL:   CBC Recent Labs  Lab 04/12/17 1045  WBC 5.9  HGB 13.6  HCT 46.8  PLT 133*   ------------------------------------------------------------------------------------------------------------------  Chemistries  Recent Labs  Lab 04/12/17 1045  04/14/17 0456  NA 140   < > 140  K 3.2*   < > 4.5  CL 99*   < > 99*  CO2 37*   < > 36*  GLUCOSE 89   < > 98  BUN 18   < > 19  CREATININE 1.05   < > 1.04  CALCIUM 8.0*   < > 8.2*  MG 2.2   < > 1.9  AST 15  --   --   ALT 13*  --   --   ALKPHOS 80  --   --   BILITOT 1.0  --   --    < > = values in this interval not displayed.   ------------------------------------------------------------------------------------------------------------------  Cardiac Enzymes No results for input(s): TROPONINI in the last 168 hours. ------------------------------------------------------------------------------------------------------------------  RADIOLOGY:  Dg Chest 2 View  Result Date: 04/12/2017 CLINICAL DATA:  Bilateral leg swelling. EXAM: CHEST  2 VIEW COMPARISON:  Chest x-ray 01/10/2015 . FINDINGS: Cardiomegaly with pulmonary vascular prominence and bilateral interstitial prominence consistent CHF. No pleural effusion or pneumothorax. No acute bony abnormality . IMPRESSION: Cardiomegaly with pulmonary venous congestion bilateral interstitial prominence consistent CHF. Electronically Signed   By: Marcello Moores  Register   On: 04/12/2017 12:14     ASSESSMENT AND PLAN:    61 year old morbidly obese male with history of chronic systolic heart failure ejection fraction  40-45% who presents with shortness of breath and lower extremity edema.  1. Acute on chronic systolic heart failure with ejection fraction 40-45% Continue IV Lasix (I will increase to 60 mg IV every 8 hours) Monitor intake  and output with daily weight Follow up on echocardiogram CHF clinic upon discharge Continue Coreg and lisinopril Cardiology consultation appreciated  2.Tobacco dependence: Patient is encouraged to quit smoking. Counseling was provided for 4 minutes.  3. Morbid obesity: Encouraged weight loss and diet discretion  4. Essential hypertension: Continue Coreg and lisinopril  5. OSA: Patient was diagnosed with obstructive sleep apnea over 4 years ago. He does not wear CPAP his wife reports that he they never got the prescription for the CPAP device.   Management plans discussed with the patient and he is in agreement.  CODE STATUS: full  TOTAL TIME TAKING CARE OF THIS PATIENT: 25 minutes.     POSSIBLE D/C 2-3 days, DEPENDING ON CLINICAL CONDITION.   Evangelos Paulino M.D on 04/14/2017 at 9:06 AM  Between 7am to 6pm - Pager - [phone] After 6pm go to [email or web address] - password [email or web address] [facility]  Office  [phone]  CC: Primary care physician; Patient, No Pcp Per  Note: This dictation was prepared with Dragon dictation along with smaller phrase technology. Any transcriptional errors that result from this process are unintentional.

## 2017-04-14 NOTE — Progress Notes (Signed)
SUBJECTIVE: Patient is feeling much better less short of breath   Vitals:   04/13/17 2025 04/14/17 0355 04/14/17 0500 04/14/17 0812  BP: 140/80 139/87  (!) 138/92  Pulse: 76 73  70  Resp: 17 18  18   Temp: 99.3 F (37.4 C) 98.2 F (36.8 C)  98.4 F (36.9 C)  TempSrc: Oral Oral  Oral  SpO2: 96% 90%  91%  Weight:   (!) 385 lb 2 oz (174.7 kg)   Height:        Intake/Output Summary (Last 24 hours) at 04/14/2017 1104 Last data filed at 04/14/2017 0811 Gross per 24 hour  Intake -  Output 3750 ml  Net -3750 ml    LABS: Basic Metabolic Panel: Recent Labs    04/13/17 0438 04/14/17 0456  NA 139 140  K 4.3 4.5  CL 99* 99*  CO2 35* 36*  GLUCOSE 96 98  BUN 20 19  CREATININE 1.08 1.04  CALCIUM 8.0* 8.2*  MG 2.0 1.9  PHOS 3.7  --    Liver Function Tests: Recent Labs    04/12/17 1045  AST 15  ALT 13*  ALKPHOS 80  BILITOT 1.0  PROT 7.3  ALBUMIN 3.1*   No results for input(s): LIPASE, AMYLASE in the last 72 hours. CBC: Recent Labs    04/12/17 1045  WBC 5.9  NEUTROABS 4.0  HGB 13.6  HCT 46.8  MCV 73.6*  PLT 133*   Cardiac Enzymes: No results for input(s): CKTOTAL, CKMB, CKMBINDEX, TROPONINI in the last 72 hours. BNP: Invalid input(s): POCBNP D-Dimer: No results for input(s): DDIMER in the last 72 hours. Hemoglobin A1C: No results for input(s): HGBA1C in the last 72 hours. Fasting Lipid Panel: No results for input(s): CHOL, HDL, LDLCALC, TRIG, CHOLHDL, LDLDIRECT in the last 72 hours. Thyroid Function Tests: No results for input(s): TSH, T4TOTAL, T3FREE, THYROIDAB in the last 72 hours.  Invalid input(s): FREET3 Anemia Panel: No results for input(s): VITAMINB12, FOLATE, FERRITIN, TIBC, IRON, RETICCTPCT in the last 72 hours.   PHYSICAL EXAM General: Well developed, well nourished, in no acute distress HEENT:  Normocephalic and atramatic Neck:  No JVD.  Lungs: Clear bilaterally to auscultation and percussion. Heart: HRRR . Normal S1 and S2 without  gallops or murmurs.  Abdomen: Bowel sounds are positive, abdomen soft and non-tender  Msk:  Back normal, normal gait. Normal strength and tone for age. Extremities: No clubbing, cyanosis or edema.   Neuro: Alert and oriented X 3. Psych:  Good affect, responds appropriately  TELEMETRY: Sinus rhythm  ASSESSMENT AND PLAN: Congestive heart failure most likely due to systolic dysfunction with significant improvement of the swelling of the legs as well as shortness of breath. Waiting for echocardiogram prior to discharge. If there is severe LV dysfunction with may have to change medication. But currently is on good medical therapy and can be discharged with follow-up in the office next week after echocardiogram.  Active Problems:   CHF (congestive heart failure) ([place])    Milayah Krell A, MD, [facility] 04/14/2017 11:04 AM

## 2017-04-14 NOTE — Progress Notes (Signed)
Pt. Frequently removes his oxygen, nursing staff keeps reminding him he needs to wear his oxygen to keep his sats up. His legs are 4 plus edema this morning. Pt has slept in the recliner all night with his legs dependent. I was able to convince pt. To elevate leg rest on the recliner and elevate his legs.

## 2017-04-14 NOTE — Progress Notes (Signed)
MEDICATION RELATED CONSULT NOTE - Electrolyte Management    Pharmacy consulted for electrolyte management for 61 yo male admitted with CHF exacerbation. Patient ordered furosemide 40mg  IV x 1 in ED and is scheduled furosemide 40mg  IV Q8hr. Goal potassium ~ 4, goal magnesium ~ 2.   Plan:  K=4.5 Mg=1.9, phos=3.7 K rising, will decrease supplementation to KCL 40 MEQ daily. Pt also on lisinopril and spironolatone. Mag ox 400mg  daily Recheck in the AM  No Known Allergies  Patient Measurements: Height: 6\' 3"  (190.5 cm) Weight: (!) 385 lb 2 oz (174.7 kg) IBW/kg (Calculated) : 84.5  Vital Signs: Temp: 98.2 F (36.8 C) (12/30 0355) Temp Source: Oral (12/30 0355) BP: 139/87 (12/30 0355) Pulse Rate: 73 (12/30 0355) Intake/Output from previous day: 12/29 0701 - 12/30 0700 In: 360 [P.O.:360] Out: 3200 [Urine:3200] Intake/Output from this shift: Total I/O In: -  Out: 550 [Urine:550]  Labs: Recent Labs    04/12/17 1045 04/13/17 0438 04/14/17 0456  WBC 5.9  --   --   HGB 13.6  --   --   HCT 46.8  --   --   PLT 133*  --   --   CREATININE 1.05 1.08 1.04  MG 2.2 2.0 1.9  PHOS  --  3.7  --   ALBUMIN 3.1*  --   --   PROT 7.3  --   --   AST 15  --   --   ALT 13*  --   --   ALKPHOS 80  --   --   BILITOT 1.0  --   --    Estimated Creatinine Clearance: 130.5 mL/min (by C-G formula based on SCr of 1.04 mg/dL).   Pharmacy will continue to monitor and adjust per consult.  Ramond Dial, Pharm.D, BCPS Clinical Pharmacist  04/14/2017,7:18 AM

## 2017-04-15 LAB — MAGNESIUM: Magnesium: 1.9 mg/dL (ref 1.7–2.4)

## 2017-04-15 LAB — BASIC METABOLIC PANEL
Anion gap: 6 (ref 5–15)
BUN: 20 mg/dL (ref 6–20)
CO2: 36 mmol/L — ABNORMAL HIGH (ref 22–32)
Calcium: 8 mg/dL — ABNORMAL LOW (ref 8.9–10.3)
Chloride: 98 mmol/L — ABNORMAL LOW (ref 101–111)
Creatinine, Ser: 1.11 mg/dL (ref 0.61–1.24)
GFR calc Af Amer: >60 mL/min (ref >60)
GFR calc non Af Amer: >60 mL/min (ref >60)
Glucose, Bld: 95 mg/dL (ref 65–99)
Potassium: 4.4 mmol/L (ref 3.5–5.1)
Sodium: 140 mmol/L (ref 135–145)

## 2017-04-15 MED ORDER — SIMVASTATIN 10 MG PO TABS: 10.0000 mg | ORAL_TABLET | Freq: Every day | ORAL | 0 refills | Status: DC

## 2017-04-15 MED ORDER — CARVEDILOL 6.25 MG PO TABS: 6.2500 mg | ORAL_TABLET | Freq: Two times a day (BID) | ORAL | 0 refills | Status: DC

## 2017-04-15 MED ORDER — TORSEMIDE 20 MG PO TABS: 20.0000 mg | ORAL_TABLET | Freq: Every day | ORAL | 0 refills | Status: DC

## 2017-04-15 MED ORDER — FUROSEMIDE 40 MG PO TABS: 80.0000 mg | ORAL_TABLET | Freq: Every day | ORAL | 0 refills | Status: DC

## 2017-04-15 MED ORDER — LISINOPRIL 10 MG PO TABS: 10.0000 mg | ORAL_TABLET | Freq: Every day | ORAL | 0 refills | Status: DC

## 2017-04-15 MED ORDER — POTASSIUM CHLORIDE CRYS ER 20 MEQ PO TBCR: 20.0000 meq | EXTENDED_RELEASE_TABLET | Freq: Every day | ORAL | 0 refills | Status: DC

## 2017-04-15 NOTE — Progress Notes (Signed)
SUBJECTIVE: Feeling much better this AM   Vitals:   04/14/17 2003 04/15/17 0443 04/15/17 0609 04/15/17 0800  BP: 125/72 126/78  (!) 129/96  Pulse: 76 75  76  Resp: 18 18    Temp: 98.7 F (37.1 C) 98.3 F (36.8 C)    TempSrc: Oral Oral    SpO2: 95% 92%    Weight:   (!) 378 lb 8 oz (171.7 kg)   Height:        Intake/Output Summary (Last 24 hours) at 04/15/2017 0810 Last data filed at 04/15/2017 0802 Gross per 24 hour  Intake 240 ml  Output 2650 ml  Net -2410 ml    LABS: Basic Metabolic Panel: Recent Labs    04/13/17 0438 04/14/17 0456 04/15/17 0510  NA 139 140 140  K 4.3 4.5 4.4  CL 99* 99* 98*  CO2 35* 36* 36*  GLUCOSE 96 98 95  BUN 20 19 20   CREATININE 1.08 1.04 1.11  CALCIUM 8.0* 8.2* 8.0*  MG 2.0 1.9 1.9  PHOS 3.7  --   --    Liver Function Tests: Recent Labs    04/12/17 1045  AST 15  ALT 13*  ALKPHOS 80  BILITOT 1.0  PROT 7.3  ALBUMIN 3.1*   No results for input(s): LIPASE, AMYLASE in the last 72 hours. CBC: Recent Labs    04/12/17 1045  WBC 5.9  NEUTROABS 4.0  HGB 13.6  HCT 46.8  MCV 73.6*  PLT 133*   Cardiac Enzymes: No results for input(s): CKTOTAL, CKMB, CKMBINDEX, TROPONINI in the last 72 hours. BNP: Invalid input(s): POCBNP D-Dimer: No results for input(s): DDIMER in the last 72 hours. Hemoglobin A1C: No results for input(s): HGBA1C in the last 72 hours. Fasting Lipid Panel: No results for input(s): CHOL, HDL, LDLCALC, TRIG, CHOLHDL, LDLDIRECT in the last 72 hours. Thyroid Function Tests: No results for input(s): TSH, T4TOTAL, T3FREE, THYROIDAB in the last 72 hours.  Invalid input(s): FREET3 Anemia Panel: No results for input(s): VITAMINB12, FOLATE, FERRITIN, TIBC, IRON, RETICCTPCT in the last 72 hours.   PHYSICAL EXAM General: Well developed, well nourished, in no acute distress HEENT:  Normocephalic and atramatic Neck:  No JVD.  Lungs: Clear bilaterally to auscultation and percussion. Heart: HRRR . Normal S1 and S2  without gallops or murmurs.  Abdomen: Bowel sounds are positive, abdomen soft and non-tender  Msk:  Back normal, normal gait. Normal strength and tone for age. Extremities: No clubbing, cyanosis or edema.   Neuro: Alert and oriented X 3. Psych:  Good affect, responds appropriately  TELEMETRY: NSR ASSESSMENT AND PLAN: CHF with moderate LV systolic dysfunction LVEF 38%. Patient is compensated now and can be discharged with f/u thursday at 2 PM.  Active Problems:   CHF (congestive heart failure) ([place])    Neoma Laming A, MD, [facility] 04/15/2017 8:10 AM

## 2017-04-15 NOTE — Care Management (Addendum)
Discussed need for home oxygen assessment prior to discharge.  Faxed [facility].  Discussed the need to follow through with [facility]. applications with patient and he verbalizes understanding.

## 2017-04-15 NOTE — Plan of Care (Signed)
  Progressing Clinical Measurements: Respiratory complications will improve 04/15/2017 0946 - Progressing by Liliane Channel, RN Safety: Ability to remain free from injury will improve 04/15/2017 0946 - Progressing by Liliane Channel, RN Skin Integrity: Risk for impaired skin integrity will decrease 04/15/2017 0946 - Progressing by Liliane Channel, RN

## 2017-04-15 NOTE — Progress Notes (Signed)
SATURATION QUALIFICATIONS: (This note is used to comply with regulatory documentation for home oxygen)  Patient Saturations on Room Air at Rest = 86  Patient Saturations on Room Air while Ambulating = 85-90  Patient Saturations on [place] of oxygen while Ambulating =0  Please briefly explain why patient needs home oxygen: Pt saturation was at 85 at rest.
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
## 2017-04-15 NOTE — Progress Notes (Signed)
Pt d/c to home today.  IV removed intact.  D/c paperwork printed and reviewed w/pt.  All medication questions and concerns reviewed and pt states understanding.  All Rx's given to patient. Pt wife to personally wheelchaired husband out for d/c.

## 2017-04-15 NOTE — Care Management (Addendum)
Has qualified for home oxygen.  Contacted Gary [name] with [employer] to discuss with patient as patient still has the equipment in the home.

## 2017-04-22 ENCOUNTER — Ambulatory Visit: Payer: Self-pay | Admitting: Family

## 2017-04-28 NOTE — Progress Notes (Deleted)
   Patient ID: Gary [name], male    DOB: [birth year], 62 y.o.   MRN: [id number]  Mr Latouche is a 62 y/o male with a history of  Echo report from 04/14/17 reviewed and showed an EF of 40% with mild MR.   Admitted 04/02/17 due to HF exacerbation. Cardiology consult was obtained. Initially needed IV diuretics and then transitioned to oral diuretics. Discharged after 3 days.   He presents today for a follow-up visit although hasn't been seen since 2016. He presents today with a chief complaint of   Review of Systems  Constitutional: Positive for fatigue. Negative for appetite change.  HENT: Negative for congestion, postnasal drip and sore throat.   Eyes: Negative.  Negative for blurred vision.  Respiratory: Positive for shortness of breath. Negative for cough and chest tightness.   Cardiovascular: Positive for leg swelling. Negative for chest pain and palpitations.  Gastrointestinal: Negative for abdominal distention and abdominal pain.  Endocrine: Negative.   Genitourinary: Negative.   Musculoskeletal: Negative for back pain and neck pain.  Skin: Negative.   Allergic/Immunologic: Negative.   Neurological: Negative for dizziness, light-headedness and headaches.  Hematological: Negative for adenopathy. Bruises/bleeds easily.  Psychiatric/Behavioral: Positive for sleep disturbance (sleep on 1 pillow with oxygen). Negative for dysphoric mood. The patient is not nervous/anxious.        Objective:   Physical Exam  Constitutional: He is oriented to person, place, and time. He appears well-developed and well-nourished.  HENT:  Head: Normocephalic and atraumatic.  Eyes: Conjunctivae are normal. Pupils are equal, round, and reactive to light.  Neck: Normal range of motion. Neck supple.  Cardiovascular: Normal rate and regular rhythm.  Pulmonary/Chest: Effort normal. He has no wheezes. He has no rales.  Abdominal: Soft. He exhibits no distension. There is no tenderness.  Musculoskeletal: He  exhibits edema (1+ pitting edema in bilateral lower legs). He exhibits no tenderness.  Neurological: He is alert and oriented to person, place, and time.  Skin: Skin is warm and dry.  Psychiatric: He has a normal mood and affect. His behavior is normal. Thought content normal.  Nursing note and vitals reviewed.         Assessment & Plan:  1: Chronic heart failure with reduced ejection fraction-  - NYHA class - follows with cardiology Humphrey Rolls)  2: HTN-  - BMP from 04/15/17 reviewed and showed sodium 140, potassium 4.4 and GFR >60  3: Tobacco use-   4: Sleep apnea-

## 2017-04-29 ENCOUNTER — Telehealth: Payer: Self-pay | Admitting: Family

## 2017-04-29 ENCOUNTER — Ambulatory Visit: Payer: Self-pay | Admitting: Family

## 2017-04-29 NOTE — Telephone Encounter (Signed)
Patient did not show for his Heart Failure Clinic appointment on 04/29/17. Will attempt to reschedule.
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
## 2017-05-03 ENCOUNTER — Encounter: Payer: Self-pay | Admitting: Family

## 2017-05-03 ENCOUNTER — Ambulatory Visit: Payer: Medicaid Other | Attending: Family | Admitting: Family

## 2017-05-03 VITALS — BP 136/79 | HR 82 | Temp 98.3°F | Resp 24 | Ht 75.0 in | Wt 367.6 lb

## 2017-05-03 DIAGNOSIS — K922 Gastrointestinal hemorrhage, unspecified: Secondary | ICD-10-CM | POA: Diagnosis not present

## 2017-05-03 DIAGNOSIS — I5022 Chronic systolic (congestive) heart failure: Secondary | ICD-10-CM | POA: Diagnosis present

## 2017-05-03 DIAGNOSIS — Z79899 Other long term (current) drug therapy: Secondary | ICD-10-CM | POA: Insufficient documentation

## 2017-05-03 DIAGNOSIS — I110 Hypertensive heart disease with heart failure: Secondary | ICD-10-CM | POA: Insufficient documentation

## 2017-05-03 DIAGNOSIS — I10 Essential (primary) hypertension: Secondary | ICD-10-CM

## 2017-05-03 DIAGNOSIS — I890 Lymphedema, not elsewhere classified: Secondary | ICD-10-CM | POA: Diagnosis not present

## 2017-05-03 DIAGNOSIS — F17210 Nicotine dependence, cigarettes, uncomplicated: Secondary | ICD-10-CM | POA: Insufficient documentation

## 2017-05-03 DIAGNOSIS — Z7982 Long term (current) use of aspirin: Secondary | ICD-10-CM | POA: Diagnosis not present

## 2017-05-03 DIAGNOSIS — G4733 Obstructive sleep apnea (adult) (pediatric): Secondary | ICD-10-CM | POA: Insufficient documentation

## 2017-05-03 DIAGNOSIS — Z720 Tobacco use: Secondary | ICD-10-CM

## 2017-05-03 NOTE — Patient Instructions (Signed)
Continue weighing daily and call for an overnight weight gain of > 2 pounds or a weekly weight gain of >5 pounds. 

## 2017-05-04 ENCOUNTER — Encounter: Payer: Self-pay | Admitting: Family

## 2017-05-04 DIAGNOSIS — Z720 Tobacco use: Secondary | ICD-10-CM | POA: Insufficient documentation

## 2017-05-04 DIAGNOSIS — I890 Lymphedema, not elsewhere classified: Secondary | ICD-10-CM | POA: Insufficient documentation

## 2017-05-07 ENCOUNTER — Ambulatory Visit: Payer: Self-pay | Admitting: Pharmacy Technician

## 2017-05-07 NOTE — Progress Notes (Signed)
Patient approved to receive medication assistance at [facility] through 2019, as long as eligibility criteria continues to be met.   Betty J. Kluttz Care Manager [facility]  

## 2017-05-22 ENCOUNTER — Other Ambulatory Visit: Payer: Self-pay | Admitting: Family

## 2017-05-22 MED ORDER — FUROSEMIDE 40 MG PO TABS: 80.0000 mg | ORAL_TABLET | Freq: Every day | ORAL | 3 refills | Status: DC

## 2017-05-22 MED ORDER — SIMVASTATIN 10 MG PO TABS: 10.0000 mg | ORAL_TABLET | Freq: Every day | ORAL | 3 refills | Status: DC

## 2017-05-22 MED ORDER — POTASSIUM CHLORIDE CRYS ER 20 MEQ PO TBCR: 20.0000 meq | EXTENDED_RELEASE_TABLET | Freq: Every day | ORAL | 3 refills | Status: DC

## 2017-05-22 MED ORDER — ASPIRIN 81 MG PO CHEW: 81.0000 mg | CHEWABLE_TABLET | Freq: Every day | ORAL | 3 refills | Status: DC

## 2017-05-22 MED ORDER — LISINOPRIL 10 MG PO TABS: 10.0000 mg | ORAL_TABLET | Freq: Every day | ORAL | 3 refills | Status: DC

## 2017-05-22 MED ORDER — CARVEDILOL 6.25 MG PO TABS: 6.2500 mg | ORAL_TABLET | Freq: Two times a day (BID) | ORAL | 3 refills | Status: DC

## 2017-05-22 MED ORDER — TORSEMIDE 20 MG PO TABS: 20.0000 mg | ORAL_TABLET | Freq: Every day | ORAL | 3 refills | Status: DC

## 2017-06-04 ENCOUNTER — Encounter: Payer: Self-pay | Admitting: Family

## 2017-06-04 ENCOUNTER — Ambulatory Visit: Payer: Medicaid Other | Attending: Family | Admitting: Family

## 2017-06-04 VITALS — BP 135/80 | HR 80 | Resp 18 | Ht 75.0 in | Wt 365.5 lb

## 2017-06-04 DIAGNOSIS — I5022 Chronic systolic (congestive) heart failure: Secondary | ICD-10-CM | POA: Diagnosis present

## 2017-06-04 DIAGNOSIS — Z720 Tobacco use: Secondary | ICD-10-CM

## 2017-06-04 DIAGNOSIS — G4730 Sleep apnea, unspecified: Secondary | ICD-10-CM | POA: Insufficient documentation

## 2017-06-04 DIAGNOSIS — Z7982 Long term (current) use of aspirin: Secondary | ICD-10-CM | POA: Diagnosis not present

## 2017-06-04 DIAGNOSIS — F17210 Nicotine dependence, cigarettes, uncomplicated: Secondary | ICD-10-CM | POA: Diagnosis not present

## 2017-06-04 DIAGNOSIS — Z79899 Other long term (current) drug therapy: Secondary | ICD-10-CM | POA: Insufficient documentation

## 2017-06-04 DIAGNOSIS — G4733 Obstructive sleep apnea (adult) (pediatric): Secondary | ICD-10-CM | POA: Insufficient documentation

## 2017-06-04 DIAGNOSIS — I10 Essential (primary) hypertension: Secondary | ICD-10-CM

## 2017-06-04 DIAGNOSIS — I890 Lymphedema, not elsewhere classified: Secondary | ICD-10-CM | POA: Insufficient documentation

## 2017-06-04 DIAGNOSIS — I110 Hypertensive heart disease with heart failure: Secondary | ICD-10-CM | POA: Insufficient documentation

## 2017-06-04 NOTE — Patient Instructions (Signed)
Continue weighing daily and call for an overnight weight gain of > 2 pounds or a weekly weight gain of >5 pounds. 
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
## 2017-09-02 ENCOUNTER — Ambulatory Visit: Payer: Self-pay | Admitting: Family

## 2017-09-02 ENCOUNTER — Telehealth: Payer: Self-pay | Admitting: Family

## 2017-09-02 NOTE — Telephone Encounter (Signed)
Patient did not show for his [facility] appointment on 09/02/17. Will attempt to reschedule.

## 2017-09-13 ENCOUNTER — Ambulatory Visit: Payer: Self-pay | Admitting: Family

## 2017-09-13 ENCOUNTER — Telehealth: Payer: Self-pay | Admitting: Family

## 2017-09-13 NOTE — Telephone Encounter (Signed)
Patient did not show for his [facility] Failure [facility] appointment on 09/13/17. Will attempt to reschedule.

## 2017-09-24 ENCOUNTER — Ambulatory Visit: Payer: Medicaid Other | Attending: Family | Admitting: Family

## 2017-09-24 ENCOUNTER — Encounter: Payer: Self-pay | Admitting: Family

## 2017-09-24 VITALS — BP 145/94 | HR 69 | Resp 18 | Ht 75.0 in | Wt 362.4 lb

## 2017-09-24 DIAGNOSIS — G4733 Obstructive sleep apnea (adult) (pediatric): Secondary | ICD-10-CM | POA: Diagnosis not present

## 2017-09-24 DIAGNOSIS — Z79899 Other long term (current) drug therapy: Secondary | ICD-10-CM | POA: Diagnosis not present

## 2017-09-24 DIAGNOSIS — I110 Hypertensive heart disease with heart failure: Secondary | ICD-10-CM | POA: Insufficient documentation

## 2017-09-24 DIAGNOSIS — Z6841 Body Mass Index (BMI) 40.0 and over, adult: Secondary | ICD-10-CM | POA: Diagnosis not present

## 2017-09-24 DIAGNOSIS — F17210 Nicotine dependence, cigarettes, uncomplicated: Secondary | ICD-10-CM | POA: Diagnosis not present

## 2017-09-24 DIAGNOSIS — I10 Essential (primary) hypertension: Secondary | ICD-10-CM

## 2017-09-24 DIAGNOSIS — I5022 Chronic systolic (congestive) heart failure: Secondary | ICD-10-CM | POA: Diagnosis not present

## 2017-09-24 DIAGNOSIS — Z7982 Long term (current) use of aspirin: Secondary | ICD-10-CM | POA: Insufficient documentation

## 2017-09-24 DIAGNOSIS — I890 Lymphedema, not elsewhere classified: Secondary | ICD-10-CM | POA: Diagnosis not present

## 2017-09-24 DIAGNOSIS — Z720 Tobacco use: Secondary | ICD-10-CM

## 2017-09-24 LAB — BASIC METABOLIC PANEL
Anion gap: 6 (ref 5–15)
BUN: 20 mg/dL (ref 6–20)
CO2: 28 mmol/L (ref 22–32)
Calcium: 8.5 mg/dL — ABNORMAL LOW (ref 8.9–10.3)
Chloride: 104 mmol/L (ref 101–111)
Creatinine, Ser: 0.89 mg/dL (ref 0.61–1.24)
GFR calc Af Amer: >60 mL/min (ref >60)
GFR calc non Af Amer: >60 mL/min (ref >60)
Glucose, Bld: 84 mg/dL (ref 65–99)
Potassium: 3.9 mmol/L (ref 3.5–5.1)
Sodium: 138 mmol/L (ref 135–145)

## 2017-09-24 MED ORDER — LISINOPRIL 20 MG PO TABS: 20.0000 mg | ORAL_TABLET | Freq: Every day | ORAL | 3 refills | Status: DC

## 2017-09-24 NOTE — Patient Instructions (Addendum)
Continue weighing daily and call for an overnight weight gain of > 2 pounds or a weekly weight gain of >5 pounds.  Increase lisinopril to 2 tablets daily until gone and then begin new lisinopril at 20mg  once daily.
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
## 2017-10-28 ENCOUNTER — Encounter: Payer: Self-pay | Admitting: Family

## 2017-10-28 ENCOUNTER — Ambulatory Visit: Payer: Medicaid Other | Attending: Family | Admitting: Family

## 2017-10-28 VITALS — BP 115/82 | HR 77 | Resp 18 | Ht 75.0 in | Wt 358.4 lb

## 2017-10-28 DIAGNOSIS — Z833 Family history of diabetes mellitus: Secondary | ICD-10-CM | POA: Insufficient documentation

## 2017-10-28 DIAGNOSIS — K922 Gastrointestinal hemorrhage, unspecified: Secondary | ICD-10-CM | POA: Diagnosis not present

## 2017-10-28 DIAGNOSIS — I890 Lymphedema, not elsewhere classified: Secondary | ICD-10-CM | POA: Diagnosis not present

## 2017-10-28 DIAGNOSIS — Z9049 Acquired absence of other specified parts of digestive tract: Secondary | ICD-10-CM | POA: Insufficient documentation

## 2017-10-28 DIAGNOSIS — Z6841 Body Mass Index (BMI) 40.0 and over, adult: Secondary | ICD-10-CM | POA: Diagnosis not present

## 2017-10-28 DIAGNOSIS — Z8379 Family history of other diseases of the digestive system: Secondary | ICD-10-CM | POA: Diagnosis not present

## 2017-10-28 DIAGNOSIS — F17210 Nicotine dependence, cigarettes, uncomplicated: Secondary | ICD-10-CM | POA: Diagnosis not present

## 2017-10-28 DIAGNOSIS — Z823 Family history of stroke: Secondary | ICD-10-CM | POA: Insufficient documentation

## 2017-10-28 DIAGNOSIS — Z720 Tobacco use: Secondary | ICD-10-CM

## 2017-10-28 DIAGNOSIS — Z7982 Long term (current) use of aspirin: Secondary | ICD-10-CM | POA: Insufficient documentation

## 2017-10-28 DIAGNOSIS — I110 Hypertensive heart disease with heart failure: Secondary | ICD-10-CM | POA: Insufficient documentation

## 2017-10-28 DIAGNOSIS — Z79899 Other long term (current) drug therapy: Secondary | ICD-10-CM | POA: Diagnosis not present

## 2017-10-28 DIAGNOSIS — G4733 Obstructive sleep apnea (adult) (pediatric): Secondary | ICD-10-CM | POA: Diagnosis not present

## 2017-10-28 DIAGNOSIS — I5022 Chronic systolic (congestive) heart failure: Secondary | ICD-10-CM

## 2017-10-28 DIAGNOSIS — Z09 Encounter for follow-up examination after completed treatment for conditions other than malignant neoplasm: Secondary | ICD-10-CM | POA: Diagnosis not present

## 2017-10-28 DIAGNOSIS — I10 Essential (primary) hypertension: Secondary | ICD-10-CM

## 2017-10-28 LAB — BASIC METABOLIC PANEL
Anion gap: 9 (ref 5–15)
BUN: 16 mg/dL (ref 6–20)
CO2: 30 mmol/L (ref 22–32)
Calcium: 8.5 mg/dL — ABNORMAL LOW (ref 8.9–10.3)
Chloride: 103 mmol/L (ref 98–111)
Creatinine, Ser: 0.97 mg/dL (ref 0.61–1.24)
GFR calc Af Amer: >60 mL/min (ref >60)
GFR calc non Af Amer: >60 mL/min (ref >60)
Glucose, Bld: 92 mg/dL (ref 70–99)
Potassium: 3.8 mmol/L (ref 3.5–5.1)
Sodium: 142 mmol/L (ref 135–145)

## 2017-10-28 NOTE — Patient Instructions (Signed)
Continue weighing daily and call for an overnight weight gain of > 2 pounds or a weekly weight gain of >5 pounds. 
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
## 2017-11-25 ENCOUNTER — Ambulatory Visit: Payer: Medicaid Other | Admitting: Family
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
## 2017-11-28 ENCOUNTER — Ambulatory Visit: Payer: Medicaid Other | Attending: Family | Admitting: Family

## 2017-11-28 ENCOUNTER — Encounter: Payer: Self-pay | Admitting: Family

## 2017-11-28 VITALS — BP 134/76 | HR 81 | Resp 18 | Ht 75.0 in | Wt 372.0 lb

## 2017-11-28 DIAGNOSIS — I5023 Acute on chronic systolic (congestive) heart failure: Secondary | ICD-10-CM | POA: Insufficient documentation

## 2017-11-28 DIAGNOSIS — Z6841 Body Mass Index (BMI) 40.0 and over, adult: Secondary | ICD-10-CM | POA: Diagnosis not present

## 2017-11-28 DIAGNOSIS — Z7982 Long term (current) use of aspirin: Secondary | ICD-10-CM | POA: Insufficient documentation

## 2017-11-28 DIAGNOSIS — Z720 Tobacco use: Secondary | ICD-10-CM

## 2017-11-28 DIAGNOSIS — F17210 Nicotine dependence, cigarettes, uncomplicated: Secondary | ICD-10-CM | POA: Diagnosis not present

## 2017-11-28 DIAGNOSIS — I5021 Acute systolic (congestive) heart failure: Secondary | ICD-10-CM

## 2017-11-28 DIAGNOSIS — I110 Hypertensive heart disease with heart failure: Secondary | ICD-10-CM | POA: Insufficient documentation

## 2017-11-28 DIAGNOSIS — Z833 Family history of diabetes mellitus: Secondary | ICD-10-CM | POA: Insufficient documentation

## 2017-11-28 DIAGNOSIS — G4733 Obstructive sleep apnea (adult) (pediatric): Secondary | ICD-10-CM | POA: Diagnosis not present

## 2017-11-28 DIAGNOSIS — Z79899 Other long term (current) drug therapy: Secondary | ICD-10-CM | POA: Diagnosis not present

## 2017-11-28 DIAGNOSIS — I890 Lymphedema, not elsewhere classified: Secondary | ICD-10-CM | POA: Diagnosis not present

## 2017-11-28 DIAGNOSIS — I509 Heart failure, unspecified: Secondary | ICD-10-CM | POA: Diagnosis present

## 2017-11-28 DIAGNOSIS — Z9049 Acquired absence of other specified parts of digestive tract: Secondary | ICD-10-CM | POA: Diagnosis not present

## 2017-11-28 DIAGNOSIS — Z8711 Personal history of peptic ulcer disease: Secondary | ICD-10-CM | POA: Diagnosis not present

## 2017-11-28 DIAGNOSIS — Z823 Family history of stroke: Secondary | ICD-10-CM | POA: Diagnosis not present

## 2017-11-28 DIAGNOSIS — I10 Essential (primary) hypertension: Secondary | ICD-10-CM

## 2017-11-28 NOTE — Patient Instructions (Addendum)
Continue weighing daily and call for an overnight weight gain of > 2 pounds or a weekly weight gain of >5 pounds.  For the next 2 days, take an additional 40mg  torsemide (2 tablets) along with an additional 62meq potassium (1 tablet)  Will send a referral for a sleep study as well as lymphapress compression pumps.

## 2017-12-12 ENCOUNTER — Ambulatory Visit: Payer: Medicaid Other | Attending: Internal Medicine

## 2017-12-18 ENCOUNTER — Ambulatory Visit: Payer: Medicaid Other | Admitting: Family
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
## 2017-12-23 ENCOUNTER — Ambulatory Visit: Payer: Medicaid Other | Admitting: Family

## 2018-01-01 ENCOUNTER — Encounter: Payer: Self-pay | Admitting: Family

## 2018-01-01 ENCOUNTER — Ambulatory Visit: Payer: Medicaid Other | Attending: Family | Admitting: Family

## 2018-01-01 VITALS — BP 134/81 | HR 70 | Resp 18 | Ht 75.0 in | Wt 369.0 lb

## 2018-01-01 DIAGNOSIS — I10 Essential (primary) hypertension: Secondary | ICD-10-CM

## 2018-01-01 DIAGNOSIS — K922 Gastrointestinal hemorrhage, unspecified: Secondary | ICD-10-CM | POA: Diagnosis not present

## 2018-01-01 DIAGNOSIS — Z7982 Long term (current) use of aspirin: Secondary | ICD-10-CM | POA: Diagnosis not present

## 2018-01-01 DIAGNOSIS — I5022 Chronic systolic (congestive) heart failure: Secondary | ICD-10-CM | POA: Diagnosis not present

## 2018-01-01 DIAGNOSIS — I110 Hypertensive heart disease with heart failure: Secondary | ICD-10-CM | POA: Insufficient documentation

## 2018-01-01 DIAGNOSIS — Z720 Tobacco use: Secondary | ICD-10-CM

## 2018-01-01 DIAGNOSIS — I890 Lymphedema, not elsewhere classified: Secondary | ICD-10-CM | POA: Insufficient documentation

## 2018-01-01 DIAGNOSIS — G4733 Obstructive sleep apnea (adult) (pediatric): Secondary | ICD-10-CM | POA: Diagnosis not present

## 2018-01-01 DIAGNOSIS — R0602 Shortness of breath: Secondary | ICD-10-CM | POA: Diagnosis present

## 2018-01-01 DIAGNOSIS — F17210 Nicotine dependence, cigarettes, uncomplicated: Secondary | ICD-10-CM | POA: Insufficient documentation

## 2018-01-01 DIAGNOSIS — Z79899 Other long term (current) drug therapy: Secondary | ICD-10-CM | POA: Diagnosis not present

## 2018-01-01 MED ORDER — POTASSIUM CHLORIDE CRYS ER 20 MEQ PO TBCR: 20.0000 meq | EXTENDED_RELEASE_TABLET | Freq: Two times a day (BID) | ORAL | 3 refills | Status: DC

## 2018-01-01 MED ORDER — FUROSEMIDE 40 MG PO TABS
80.0000 mg | ORAL_TABLET | Freq: Two times a day (BID) | ORAL | 3 refills | Status: DC
Start: 2018-01-01 — End: 2018-10-22

## 2018-01-01 NOTE — Patient Instructions (Addendum)
Continue weighing daily and call for an overnight weight gain of > 2 pounds or a weekly weight gain of >5 pounds.  Stop taking torsemide.   Increase furosemide (lasix) to 80mg  twice daily which will be 2 tablets twice daily.  Increase potassium to 1 tablet twice daily.
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
## 2018-01-02 ENCOUNTER — Encounter: Payer: Self-pay | Admitting: Family
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
## 2018-01-29 ENCOUNTER — Ambulatory Visit: Payer: Medicaid Other | Admitting: Family

## 2018-01-30 ENCOUNTER — Ambulatory Visit: Payer: Self-pay | Admitting: Family
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
## 2018-02-17 ENCOUNTER — Other Ambulatory Visit: Payer: Self-pay

## 2018-02-17 ENCOUNTER — Ambulatory Visit: Payer: Medicaid Other | Admitting: Family

## 2018-02-17 ENCOUNTER — Encounter: Payer: Self-pay | Admitting: Family

## 2018-02-17 ENCOUNTER — Ambulatory Visit: Payer: Medicaid Other | Attending: Family | Admitting: Family

## 2018-02-17 VITALS — BP 113/77 | Temp 98.0°F | Resp 22 | Ht 75.0 in | Wt 372.8 lb

## 2018-02-17 DIAGNOSIS — I5022 Chronic systolic (congestive) heart failure: Secondary | ICD-10-CM | POA: Diagnosis not present

## 2018-02-17 DIAGNOSIS — Z79899 Other long term (current) drug therapy: Secondary | ICD-10-CM | POA: Insufficient documentation

## 2018-02-17 DIAGNOSIS — I10 Essential (primary) hypertension: Secondary | ICD-10-CM

## 2018-02-17 DIAGNOSIS — Z9049 Acquired absence of other specified parts of digestive tract: Secondary | ICD-10-CM | POA: Insufficient documentation

## 2018-02-17 DIAGNOSIS — I110 Hypertensive heart disease with heart failure: Secondary | ICD-10-CM | POA: Insufficient documentation

## 2018-02-17 DIAGNOSIS — Z720 Tobacco use: Secondary | ICD-10-CM

## 2018-02-17 DIAGNOSIS — Z7982 Long term (current) use of aspirin: Secondary | ICD-10-CM | POA: Diagnosis not present

## 2018-02-17 DIAGNOSIS — I890 Lymphedema, not elsewhere classified: Secondary | ICD-10-CM | POA: Diagnosis not present

## 2018-02-17 DIAGNOSIS — F17210 Nicotine dependence, cigarettes, uncomplicated: Secondary | ICD-10-CM | POA: Diagnosis not present

## 2018-02-17 DIAGNOSIS — G4733 Obstructive sleep apnea (adult) (pediatric): Secondary | ICD-10-CM | POA: Diagnosis not present

## 2018-02-17 LAB — BASIC METABOLIC PANEL
Anion gap: 7 (ref 5–15)
BUN: 20 mg/dL (ref 6–20)
CO2: 31 mmol/L (ref 22–32)
Calcium: 8.5 mg/dL — ABNORMAL LOW (ref 8.9–10.3)
Chloride: 103 mmol/L (ref 98–111)
Creatinine, Ser: 0.94 mg/dL (ref 0.61–1.24)
GFR calc Af Amer: >60 mL/min (ref >60)
GFR calc non Af Amer: >60 mL/min (ref >60)
Glucose, Bld: 93 mg/dL (ref 70–99)
Potassium: 3.9 mmol/L (ref 3.5–5.1)
Sodium: 141 mmol/L (ref 135–145)

## 2018-02-17 NOTE — Patient Instructions (Signed)
Continue weighing daily and call for an overnight weight gain of > 2 pounds or a weekly weight gain of >5 pounds. 
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
## 2018-02-20 ENCOUNTER — Ambulatory Visit: Payer: Medicaid Other | Attending: Internal Medicine

## 2018-04-02 ENCOUNTER — Ambulatory Visit: Payer: Medicaid Other | Attending: Neurology

## 2018-05-14 ENCOUNTER — Encounter: Payer: Self-pay | Admitting: Family Medicine

## 2018-05-14 ENCOUNTER — Other Ambulatory Visit: Payer: Self-pay

## 2018-05-14 DIAGNOSIS — R195 Other fecal abnormalities: Secondary | ICD-10-CM
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
## 2018-05-19 ENCOUNTER — Encounter: Payer: Self-pay | Admitting: Family

## 2018-05-19 ENCOUNTER — Encounter: Payer: Self-pay | Admitting: Pharmacist

## 2018-05-19 ENCOUNTER — Ambulatory Visit: Payer: Medicaid Other | Attending: Family | Admitting: Family

## 2018-05-19 VITALS — BP 116/79 | HR 76 | Resp 18 | Ht 75.0 in | Wt 387.1 lb

## 2018-05-19 DIAGNOSIS — I890 Lymphedema, not elsewhere classified: Secondary | ICD-10-CM | POA: Insufficient documentation

## 2018-05-19 DIAGNOSIS — Z7982 Long term (current) use of aspirin: Secondary | ICD-10-CM | POA: Insufficient documentation

## 2018-05-19 DIAGNOSIS — Z6841 Body Mass Index (BMI) 40.0 and over, adult: Secondary | ICD-10-CM | POA: Insufficient documentation

## 2018-05-19 DIAGNOSIS — F17210 Nicotine dependence, cigarettes, uncomplicated: Secondary | ICD-10-CM | POA: Insufficient documentation

## 2018-05-19 DIAGNOSIS — Z833 Family history of diabetes mellitus: Secondary | ICD-10-CM | POA: Diagnosis not present

## 2018-05-19 DIAGNOSIS — I5022 Chronic systolic (congestive) heart failure: Secondary | ICD-10-CM | POA: Diagnosis present

## 2018-05-19 DIAGNOSIS — Z9049 Acquired absence of other specified parts of digestive tract: Secondary | ICD-10-CM | POA: Insufficient documentation

## 2018-05-19 DIAGNOSIS — Z79899 Other long term (current) drug therapy: Secondary | ICD-10-CM | POA: Insufficient documentation

## 2018-05-19 DIAGNOSIS — Z720 Tobacco use: Secondary | ICD-10-CM

## 2018-05-19 DIAGNOSIS — I110 Hypertensive heart disease with heart failure: Secondary | ICD-10-CM | POA: Diagnosis present

## 2018-05-19 DIAGNOSIS — G4733 Obstructive sleep apnea (adult) (pediatric): Secondary | ICD-10-CM | POA: Insufficient documentation

## 2018-05-19 DIAGNOSIS — I10 Essential (primary) hypertension: Secondary | ICD-10-CM
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
## 2018-05-19 NOTE — Patient Instructions (Signed)
Continue weighing daily and call for an overnight weight gain of > 2 pounds or a weekly weight gain of >5 pounds. 

## 2018-05-28 ENCOUNTER — Emergency Department: Payer: Medicaid Other

## 2018-05-28 ENCOUNTER — Encounter: Payer: Self-pay | Admitting: Emergency Medicine

## 2018-05-28 ENCOUNTER — Inpatient Hospital Stay
Admission: EM | Admit: 2018-05-28 | Discharge: 2018-05-31 | DRG: 291 | Disposition: A | Discharge status: Home Health | Payer: Medicaid Other | Attending: Internal Medicine | Admitting: Internal Medicine

## 2018-05-28 ENCOUNTER — Other Ambulatory Visit: Payer: Self-pay

## 2018-05-28 DIAGNOSIS — J9621 Acute and chronic respiratory failure with hypoxia: Secondary | ICD-10-CM

## 2018-05-28 DIAGNOSIS — Z79899 Other long term (current) drug therapy: Secondary | ICD-10-CM

## 2018-05-28 DIAGNOSIS — Z9119 Patient's noncompliance with other medical treatment and regimen: Secondary | ICD-10-CM | POA: Diagnosis not present

## 2018-05-28 DIAGNOSIS — Z9049 Acquired absence of other specified parts of digestive tract: Secondary | ICD-10-CM

## 2018-05-28 DIAGNOSIS — J9601 Acute respiratory failure with hypoxia: Secondary | ICD-10-CM | POA: Diagnosis not present

## 2018-05-28 DIAGNOSIS — I5033 Acute on chronic diastolic (congestive) heart failure: Secondary | ICD-10-CM | POA: Diagnosis present

## 2018-05-28 DIAGNOSIS — J9622 Acute and chronic respiratory failure with hypercapnia: Secondary | ICD-10-CM | POA: Diagnosis present

## 2018-05-28 DIAGNOSIS — E662 Morbid (severe) obesity with alveolar hypoventilation: Secondary | ICD-10-CM | POA: Diagnosis present

## 2018-05-28 DIAGNOSIS — I428 Other cardiomyopathies: Secondary | ICD-10-CM | POA: Diagnosis present

## 2018-05-28 DIAGNOSIS — R0902 Hypoxemia: Secondary | ICD-10-CM

## 2018-05-28 DIAGNOSIS — F17210 Nicotine dependence, cigarettes, uncomplicated: Secondary | ICD-10-CM | POA: Diagnosis present

## 2018-05-28 DIAGNOSIS — Z23 Encounter for immunization: Secondary | ICD-10-CM

## 2018-05-28 DIAGNOSIS — I5043 Acute on chronic combined systolic (congestive) and diastolic (congestive) heart failure: Secondary | ICD-10-CM

## 2018-05-28 DIAGNOSIS — J9602 Acute respiratory failure with hypercapnia: Secondary | ICD-10-CM | POA: Diagnosis not present

## 2018-05-28 DIAGNOSIS — F1290 Cannabis use, unspecified, uncomplicated: Secondary | ICD-10-CM | POA: Diagnosis present

## 2018-05-28 DIAGNOSIS — I110 Hypertensive heart disease with heart failure: Principal | ICD-10-CM | POA: Diagnosis present

## 2018-05-28 DIAGNOSIS — Z9981 Dependence on supplemental oxygen: Secondary | ICD-10-CM | POA: Diagnosis not present

## 2018-05-28 DIAGNOSIS — E785 Hyperlipidemia, unspecified: Secondary | ICD-10-CM | POA: Diagnosis present

## 2018-05-28 DIAGNOSIS — I509 Heart failure, unspecified: Secondary | ICD-10-CM

## 2018-05-28 DIAGNOSIS — Z7982 Long term (current) use of aspirin: Secondary | ICD-10-CM

## 2018-05-28 DIAGNOSIS — Z833 Family history of diabetes mellitus: Secondary | ICD-10-CM

## 2018-05-28 DIAGNOSIS — D696 Thrombocytopenia, unspecified: Secondary | ICD-10-CM | POA: Diagnosis present

## 2018-05-28 DIAGNOSIS — Z8711 Personal history of peptic ulcer disease: Secondary | ICD-10-CM | POA: Diagnosis not present

## 2018-05-28 DIAGNOSIS — E872 Acidosis: Secondary | ICD-10-CM | POA: Diagnosis present

## 2018-05-28 DIAGNOSIS — J9620 Acute and chronic respiratory failure, unspecified whether with hypoxia or hypercapnia: Secondary | ICD-10-CM | POA: Diagnosis present

## 2018-05-28 DIAGNOSIS — Z713 Dietary counseling and surveillance: Secondary | ICD-10-CM | POA: Diagnosis not present

## 2018-05-28 DIAGNOSIS — J9600 Acute respiratory failure, unspecified whether with hypoxia or hypercapnia: Secondary | ICD-10-CM

## 2018-05-28 DIAGNOSIS — Z6841 Body Mass Index (BMI) 40.0 and over, adult: Secondary | ICD-10-CM

## 2018-05-28 HISTORY — DX: Acute and chronic respiratory failure with hypoxia: J96.21

## 2018-05-28 HISTORY — DX: Acute on chronic combined systolic (congestive) and diastolic (congestive) heart failure: I50.43

## 2018-05-28 HISTORY — DX: Acute and chronic respiratory failure with hypercapnia: J96.22

## 2018-05-28 LAB — BLOOD GAS, ARTERIAL
Acid-Base Excess: 10.1 mmol/L — ABNORMAL HIGH (ref 0.0–2.0)
Bicarbonate: 42.2 mmol/L — ABNORMAL HIGH (ref 20.0–28.0)
FIO2: 0.28
O2 Saturation: 90.4 %
Patient temperature: 37
pCO2 arterial: 103 mmHg (ref 32.0–48.0)
pH, Arterial: 7.22 — ABNORMAL LOW (ref 7.350–7.450)
pO2, Arterial: 71 mmHg — ABNORMAL LOW (ref 83.0–108.0)

## 2018-05-28 LAB — CBC
HCT: 58.2 % — ABNORMAL HIGH (ref 39.0–52.0)
Hemoglobin: 15.9 g/dL (ref 13.0–17.0)
MCH: 22.7 pg — ABNORMAL LOW (ref 26.0–34.0)
MCHC: 27.3 g/dL — ABNORMAL LOW (ref 30.0–36.0)
MCV: 83.1 fL (ref 80.0–100.0)
Platelets: 129 10*3/uL — ABNORMAL LOW (ref 150–400)
RBC: 7 MIL/uL — ABNORMAL HIGH (ref 4.22–5.81)
RDW: 18.2 % — ABNORMAL HIGH (ref 11.5–15.5)
WBC: 7.1 10*3/uL (ref 4.0–10.5)
nRBC: 0.7 % — ABNORMAL HIGH (ref 0.0–0.2)

## 2018-05-28 LAB — BASIC METABOLIC PANEL
Anion gap: 4 — ABNORMAL LOW (ref 5–15)
BUN: 13 mg/dL (ref 6–20)
CO2: 37 mmol/L — ABNORMAL HIGH (ref 22–32)
Calcium: 8.6 mg/dL — ABNORMAL LOW (ref 8.9–10.3)
Chloride: 102 mmol/L (ref 98–111)
Creatinine, Ser: 0.88 mg/dL (ref 0.61–1.24)
GFR calc Af Amer: >60 mL/min (ref >60)
GFR calc non Af Amer: >60 mL/min (ref >60)
Glucose, Bld: 110 mg/dL — ABNORMAL HIGH (ref 70–99)
Potassium: 3.8 mmol/L (ref 3.5–5.1)
Sodium: 143 mmol/L (ref 135–145)

## 2018-05-28 LAB — GLUCOSE, CAPILLARY
Glucose-Capillary: 118 mg/dL — ABNORMAL HIGH (ref 70–99)
Glucose-Capillary: 87 mg/dL (ref 70–99)

## 2018-05-28 LAB — TROPONIN I: Troponin I: <0.03 ng/mL (ref <0.03)

## 2018-05-28 MED ORDER — FUROSEMIDE 10 MG/ML IJ SOLN
80.0000 mg | Freq: Once | INTRAMUSCULAR | Status: AC
  Administered 2018-05-28: 80 mg via INTRAVENOUS
  Filled 2018-05-28: qty 8

## 2018-05-28 MED ORDER — IPRATROPIUM-ALBUTEROL 0.5-2.5 (3) MG/3ML IN SOLN: 3.0000 mL | Freq: Four times a day (QID) | RESPIRATORY_TRACT | Status: DC | PRN
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
## 2018-05-28 NOTE — ED Notes (Signed)
Patient was given sandwich tray and drink as allowed by MD.
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
## 2018-05-28 NOTE — ED Notes (Signed)
Was told room was being switched to 238 and room was not clean

## 2018-05-28 NOTE — ED Notes (Signed)
Patient was taken off 2L [place] by MD to see how patient tolerated room air. Patient O2 went down to 79% on RA and when placed back on O2 was 95%.

## 2018-05-28 NOTE — ED Notes (Signed)
Attempted to call RT again for ABG and no answer. Charge nurse notified.

## 2018-05-28 NOTE — ED Notes (Signed)
Admitting MD and ED MD at bedside . Obtained CBG and EKG because patient was sleepy.

## 2018-05-28 NOTE — Progress Notes (Signed)
Abg results given to Dr. Anselm Jungling, with new orders to place pt on Bipap. Placed pt on Bipap, pt tol okay at this time. RN aware of pt wanting to take mask off.
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
## 2018-05-28 NOTE — ED Notes (Signed)
To room to give pt phone to speak with wife.  Pt had gotten up off end of stretcher and used toilet.  Pt ambulatory in room without assistance.  Encouraged pt to call for help and/or use urinal for measuring of urine output.  Pt assisted back to bed at this time.

## 2018-05-28 NOTE — Progress Notes (Signed)
Pt transported to ICU 8 on Bipap without incident. Pt remains on Bipap and tol well at this time. Report given to ICU RT.

## 2018-05-28 NOTE — ED Notes (Signed)
Attempted to call RT to make them aware of ABG ordered

## 2018-05-28 NOTE — ED Triage Notes (Signed)
Pt to ED from home c/o SOB and edema to legs and face.  States takes lasix and has been taking like supposed to.  Hasn't checked weights at home but feels like retaining fluid.  Pt states oxygen use at night.  Pt oxygen sat in the 80's% in triage and instructed to take deep breaths oxygen up to 93%, 2L [place] placed on patient for comfort at this time.  Breath sounds diminished, difficulty to hear d/t size.

## 2018-05-29 ENCOUNTER — Inpatient Hospital Stay: Payer: Medicaid Other

## 2018-05-29 ENCOUNTER — Inpatient Hospital Stay
Admit: 2018-05-29 | Discharge: 2018-05-29 | Disposition: A | Discharge status: Home / Self Care | Payer: Medicaid Other | Attending: Internal Medicine | Admitting: Internal Medicine

## 2018-05-29 ENCOUNTER — Encounter: Payer: Self-pay | Admitting: Emergency Medicine

## 2018-05-29 LAB — ECHOCARDIOGRAM COMPLETE
Height: 75 in
Weight: 6144.66 oz

## 2018-05-29 LAB — CBC
HCT: 55.6 % — ABNORMAL HIGH (ref 39.0–52.0)
Hemoglobin: 14.7 g/dL (ref 13.0–17.0)
MCH: 22.4 pg — ABNORMAL LOW (ref 26.0–34.0)
MCHC: 26.4 g/dL — ABNORMAL LOW (ref 30.0–36.0)
MCV: 84.6 fL (ref 80.0–100.0)
Platelets: 127 10*3/uL — ABNORMAL LOW (ref 150–400)
RBC: 6.57 MIL/uL — ABNORMAL HIGH (ref 4.22–5.81)
RDW: 17.9 % — ABNORMAL HIGH (ref 11.5–15.5)
WBC: 7.4 10*3/uL (ref 4.0–10.5)
nRBC: 0.4 % — ABNORMAL HIGH (ref 0.0–0.2)

## 2018-05-29 LAB — BASIC METABOLIC PANEL
Anion gap: 4 — ABNORMAL LOW (ref 5–15)
BUN: 13 mg/dL (ref 6–20)
CO2: 38 mmol/L — ABNORMAL HIGH (ref 22–32)
Calcium: 8.1 mg/dL — ABNORMAL LOW (ref 8.9–10.3)
Chloride: 103 mmol/L (ref 98–111)
Creatinine, Ser: 0.91 mg/dL (ref 0.61–1.24)
GFR calc Af Amer: >60 mL/min (ref >60)
GFR calc non Af Amer: >60 mL/min (ref >60)
Glucose, Bld: 88 mg/dL (ref 70–99)
Potassium: 4 mmol/L (ref 3.5–5.1)
Sodium: 145 mmol/L (ref 135–145)

## 2018-05-29 LAB — BRAIN NATRIURETIC PEPTIDE
B Natriuretic Peptide: 248 pg/mL — ABNORMAL HIGH (ref 0.0–100.0)
B Natriuretic Peptide: 228 pg/mL — ABNORMAL HIGH (ref 0.0–100.0)

## 2018-05-29 LAB — MRSA PCR SCREENING: MRSA by PCR: NEGATIVE

## 2018-05-29 MED ORDER — SODIUM CHLORIDE 0.9 % IV SOLN: 250.0000 mL | INTRAVENOUS | Status: DC | PRN

## 2018-05-29 MED ORDER — FUROSEMIDE 10 MG/ML IJ SOLN
40.0000 mg | Freq: Three times a day (TID) | INTRAMUSCULAR | Status: DC
  Administered 2018-05-29 – 2018-05-31 (×7): 40 mg via INTRAVENOUS
  Filled 2018-05-29 (×8): qty 4

## 2018-05-29 MED ORDER — CARVEDILOL 6.25 MG PO TABS
6.2500 mg | ORAL_TABLET | Freq: Two times a day (BID) | ORAL | Status: DC
  Administered 2018-05-29 – 2018-05-31 (×5): 6.25 mg via ORAL
  Filled 2018-05-29 (×5): qty 1

## 2018-05-29 MED ORDER — SODIUM CHLORIDE 0.9% FLUSH
3.0000 mL | Freq: Two times a day (BID) | INTRAVENOUS | Status: DC
  Administered 2018-05-29 – 2018-05-30 (×5): 3 mL via INTRAVENOUS

## 2018-05-29 MED ORDER — HEPARIN SODIUM (PORCINE) 5000 UNIT/ML IJ SOLN
5000.0000 [IU] | Freq: Three times a day (TID) | INTRAMUSCULAR | Status: DC
  Administered 2018-05-29 – 2018-05-30 (×6): 5000 [IU] via SUBCUTANEOUS
  Filled 2018-05-29 (×6): qty 1

## 2018-05-29 MED ORDER — SODIUM CHLORIDE 0.9% FLUSH: 3.0000 mL | INTRAVENOUS | Status: DC | PRN

## 2018-05-29 MED ORDER — IPRATROPIUM-ALBUTEROL 0.5-2.5 (3) MG/3ML IN SOLN: 3.0000 mL | RESPIRATORY_TRACT | Status: DC | PRN

## 2018-05-29 MED ORDER — ACETAMINOPHEN 325 MG PO TABS: 650.0000 mg | ORAL_TABLET | ORAL | Status: DC | PRN

## 2018-05-29 MED ORDER — POTASSIUM CHLORIDE CRYS ER 20 MEQ PO TBCR
20.0000 meq | EXTENDED_RELEASE_TABLET | Freq: Two times a day (BID) | ORAL | Status: DC
  Administered 2018-05-29 – 2018-05-31 (×6): 20 meq via ORAL
  Filled 2018-05-29 (×6): qty 1

## 2018-05-29 MED ORDER — SIMVASTATIN 20 MG PO TABS
10.0000 mg | ORAL_TABLET | Freq: Every day | ORAL | Status: DC
  Administered 2018-05-29 – 2018-05-31 (×3): 10 mg via ORAL
  Filled 2018-05-29 (×3): qty 1

## 2018-05-29 MED ORDER — LISINOPRIL 20 MG PO TABS
20.0000 mg | ORAL_TABLET | Freq: Every day | ORAL | Status: DC
  Administered 2018-05-29 – 2018-05-31 (×3): 20 mg via ORAL
  Filled 2018-05-29 (×3): qty 1

## 2018-05-29 MED ORDER — ONDANSETRON HCL 4 MG/2ML IJ SOLN: 4.0000 mg | Freq: Four times a day (QID) | INTRAMUSCULAR | Status: DC | PRN

## 2018-05-29 MED ORDER — ASPIRIN 81 MG PO CHEW
81.0000 mg | CHEWABLE_TABLET | Freq: Every day | ORAL | Status: DC
  Administered 2018-05-29 – 2018-05-31 (×3): 81 mg via ORAL
  Filled 2018-05-29 (×3): qty 1

## 2018-05-29 NOTE — Plan of Care (Signed)
Continue on Bipap all night.

## 2018-05-29 NOTE — Progress Notes (Signed)
*  PRELIMINARY RESULTS* Echocardiogram 2D Echocardiogram has been performed.  Gary [name] 05/29/2018, 12:11 PM

## 2018-05-29 NOTE — Progress Notes (Signed)
eLink Physician-Brief Progress Note Patient Name: Gary [name] DOB: [birth year] MRN: [id number]   Date of Service  05/29/2018  HPI/Events of Note  63 year old male with history of congestive heart failure was transferred to the ICU because of acute hypoxic hypercapnic respiratory failure secondary to CHF exacerbation and obstructive sleep apnea.   eICU Interventions  Aggressive diuresis.  As needed BiPAP.  Recommend echo, sleep study and pulmonary function test.  PRN breathing treatments.  Discussed with ICU nurse practitioner.      Intervention Category Major Interventions: Respiratory failure - evaluation and management;Hypercarbia - evaluation and management Intermediate Interventions: Hypervolemia - evaluation and management Evaluation Type: New Patient Evaluation  Mady Gemma 05/29/2018, 12:51 AM

## 2018-05-29 NOTE — Care Management Note (Signed)
Case Management Note  Patient Details  Name: Gary [name] MRN: [id number] Date of Birth: 11-23-1955  Subjective/Objective:   Per patient request RNCM reached out to Dr. Bonna Gains though in basket- pt is requesting information about his upcoming colonoscopy.  Patient could not remember when it was and he lost all the paper work.   Doran Clay RN BSN Care Manager  [phone]                  Action/Plan:   Expected Discharge Date:  06/01/18               Expected Discharge Plan:  [place]  In-House Referral:     Discharge planning Services  CM Consult  Post Acute Care Choice:    Choice offered to:     DME Arranged:    DME Agency:     HH Arranged:    HH Agency:     Status of Service:  In process, will continue to follow  If discussed at Long Length of Stay Meetings, dates discussed:    Additional Comments:  Shelbie Hutching, RN 05/29/2018, 3:12 PM

## 2018-05-29 NOTE — Progress Notes (Signed)
[place] at [facility]                                                                                                                                                                                  Patient Demographics   Gary [name], is a 63 y.o. male, DOB - 1955-07-27, ZLD:[id number]  Admit date - 05/28/2018   Admitting Physician Vaughan Basta, MD  Outpatient Primary [facility] the [facility] is [facility]   LOS - 1  Subjective: Patient is on BiPAP.  States that he is feeling little better    Review of Systems:   CONSTITUTIONAL: No documented fever. No fatigue, weakness. No weight gain, no weight loss.  EYES: No blurry or double vision.  ENT: No tinnitus. No postnasal drip. No redness of the oropharynx.  RESPIRATORY: No cough, no wheeze, no hemoptysis.  Positive dyspnea.  CARDIOVASCULAR: No chest pain. No orthopnea. No palpitations. No syncope.  GASTROINTESTINAL: No nausea, no vomiting or diarrhea. No abdominal pain. No melena or hematochezia.  GENITOURINARY: No dysuria or hematuria.  ENDOCRINE: No polyuria or nocturia. No heat or cold intolerance.  HEMATOLOGY: No anemia. No bruising. No bleeding.  INTEGUMENTARY: No rashes. No lesions.  MUSCULOSKELETAL: No arthritis. No swelling. No gout.  NEUROLOGIC: No numbness, tingling, or ataxia. No seizure-type activity.  PSYCHIATRIC: No anxiety. No insomnia. No ADD.    Vitals:   Vitals:   05/29/18 0926 05/29/18 1000 05/29/18 1100 05/29/18 1200  BP: 121/65 127/85 125/81 130/79  Pulse:  88 80 79  Resp:  17 (!) 21 16  Temp:    98.1 F (36.7 C)  TempSrc:    Axillary  SpO2:  (!) 85% 95% 93%  Weight:      Height:        Wt Readings from Last 3 Encounters:  05/29/18 (!) 174.2 kg  05/19/18 (!) 175.6 kg  02/17/18 (!) 169.1 kg     Intake/Output Summary (Last 24 hours) at 05/29/2018 1358 Last data filed at 05/29/2018 1356 Gross per 24 hour  Intake 477 ml  Output 1425 ml  Net  -948 ml    Physical Exam:   GENERAL: Pleasant-appearing in no apparent distress.  HEAD, EYES, EARS, NOSE AND THROAT: Atraumatic, normocephalic. Extraocular muscles are intact. Pupils equal and reactive to light. Sclerae anicteric. No conjunctival injection. No oro-pharyngeal erythema.  NECK: Supple. There is no jugular venous distention. No bruits, no lymphadenopathy, no thyromegaly.  HEART: Regular rate and rhythm,. No murmurs, no rubs, no clicks.  LUNGS: Clear to auscultation bilaterally. No rales or rhonchi. No wheezes.  ABDOMEN: Soft, flat, nontender, nondistended. Has good bowel sounds. No hepatosplenomegaly appreciated.  EXTREMITIES:  No evidence of any cyanosis, clubbing, or peripheral edema.  +2 pedal and radial pulses bilaterally.  NEUROLOGIC: The patient is alert, awake, and oriented x3 with no focal motor or sensory deficits appreciated bilaterally.  SKIN: Moist and warm with no rashes appreciated.  Psych: Not anxious, depressed LN: No inguinal LN enlargement    Antibiotics   Anti-infectives (From admission, onward)   None      Medications   Scheduled Meds: . aspirin  81 mg Oral Daily  . carvedilol  6.25 mg Oral BID WC  . furosemide  40 mg Intravenous TID  . heparin  5,000 Units Subcutaneous Q8H  . lisinopril  20 mg Oral Daily  . potassium chloride SA  20 mEq Oral BID  . simvastatin  10 mg Oral Daily  . sodium chloride flush  3 mL Intravenous Q12H   Continuous Infusions: . sodium chloride     PRN Meds:.sodium chloride, acetaminophen, ipratropium-albuterol, ondansetron (ZOFRAN) IV, sodium chloride flush   Data Review:   Micro Results Recent Results (from the past 240 hour(s))  MRSA PCR Screening     Status: None   Collection Time: 05/29/18 12:32 AM  Result Value Ref Range Status   MRSA by PCR NEGATIVE NEGATIVE Final    Comment:        The GeneXpert MRSA Assay (FDA approved for NASAL specimens only), is one component of a comprehensive MRSA  colonization surveillance program. It is not intended to diagnose MRSA infection nor to guide or monitor treatment for MRSA infections. Performed at [facility], [address] [place], [place] [address]     Radiology Reports Dg Chest 2 View  Result Date: 05/28/2018 CLINICAL DATA:  Congestive heart failure. Weakness. Shortness of breath. EXAM: CHEST - 2 VIEW COMPARISON:  04/12/2017. FINDINGS: Cardiomegaly. Vascular prominence, with interstitial and alveolar opacities most consistent with CHF. No significant effusion. No pneumothorax. Bones unremarkable. Similar appearance to priors. IMPRESSION: Cardiomegaly with CHF. Similar appearance to priors. Electronically Signed   By: Staci Righter M.D.   On: 05/28/2018 15:27   Dg Chest Port 1 View  Result Date: 05/29/2018 CLINICAL DATA:  Congestive Heart Failure per ordering notes. Patient unable to give adequate history at this time. EXAM: PORTABLE CHEST 1 VIEW COMPARISON:  05/28/2018 FINDINGS: Stable enlarged cardiac silhouette. Central venous congestion and mild interstitial edema pattern similar prior. Low lung volumes. No pneumothorax. No focal consolidation. IMPRESSION: No significant change. Cardiomegaly, central venous congestion and mild interstitial edema. Electronically Signed   By: Suzy Bouchard M.D.   On: 05/29/2018 10:53     CBC Recent Labs  Lab 05/28/18 1444 05/29/18 0447  WBC 7.1 7.4  HGB 15.9 14.7  HCT 58.2* 55.6*  PLT 129* 127*  MCV 83.1 84.6  MCH 22.7* 22.4*  MCHC 27.3* 26.4*  RDW 18.2* 17.9*    Chemistries  Recent Labs  Lab 05/28/18 1444 05/29/18 0028  NA 143 145  K 3.8 4.0  CL 102 103  CO2 37* 38*  GLUCOSE 110* 88  BUN 13 13  CREATININE 0.88 0.91  CALCIUM 8.6* 8.1*   ------------------------------------------------------------------------------------------------------------------ estimated creatinine clearance is 147 mL/min (by C-G formula based on SCr of 0.91  mg/dL). ------------------------------------------------------------------------------------------------------------------ No results for input(s): HGBA1C in the last 72 hours. ------------------------------------------------------------------------------------------------------------------ No results for input(s): CHOL, HDL, LDLCALC, TRIG, CHOLHDL, LDLDIRECT in the last 72 hours. ------------------------------------------------------------------------------------------------------------------ No results for input(s): TSH, T4TOTAL, T3FREE, THYROIDAB in the last 72 hours.  Invalid input(s): FREET3 ------------------------------------------------------------------------------------------------------------------ No results for input(s): VITAMINB12, FOLATE, FERRITIN,  TIBC, IRON, RETICCTPCT in the last 72 hours.  Coagulation profile No results for input(s): INR, PROTIME in the last 168 hours.  No results for input(s): DDIMER in the last 72 hours.  Cardiac Enzymes Recent Labs  Lab 05/28/18 1444  TROPONINI <0.03   ------------------------------------------------------------------------------------------------------------------ Invalid input(s): POCBNP    Assessment & Plan   *Acute respiratory failure with hypoxia and hypercapnia Secondary to acute diastolic congestive heart failure and sleep apnea Continue BiPAP wean off as tolerated We will need sleep apnea evaluation  *Acute on chronic diastolic congestive heart failure Continue IV Lasix, fluid restriction, intake and output measurement. May need to have follow-up with heart failure clinic.  *Hypertension Continue Coreg and lisinopril.  *Hyperlipidemia Continue simvastatin.     Code Status Orders  (From admission, onward)         Start     Ordered   05/29/18 0001  Full code  Continuous     05/29/18 0001        Code Status History    Date Active Date Inactive Code Status Order ID Comments User Context    04/12/2017 1353 04/15/2017 1720 Full Code [id number]  Fritzi Mandes, MD Inpatient   01/10/2015 1953 01/14/2015 1703 Full Code [id number]  Aldean Jewett, MD Inpatient   03/27/2014 1847 04/02/2014 1512 Full Code [id number]  Doreen Salvage, MD ED           Consults pulmonary critical care  DVT Prophylaxis  Lovenox  Lab Results  Component Value Date   PLT 127 (L) 05/29/2018     Time Spent in minutes   35 minutes greater than 50% of time spent in care coordination and counseling patient regarding the condition and plan of care.   Dustin Flock M.D on 05/29/2018 at 1:58 PM  Between 7am to 6pm - Pager - [phone]  After 6pm go to [email or web address] - Proofreader  [employer]   Office  [phone]

## 2018-05-29 NOTE — Care Management Note (Addendum)
Case Management Note  Patient Details  Name: LEVITICUS HARTON MRN: [id number] Date of Birth: Mar 06, 1956  Subjective/Objective:    Patient admitted with congestive heart failure.  RNCM consult for heart failure home health protocol.  Patient is very interested in the protocol but has not chosen which company he would like to go with, [facility], [facility], and [facility] all have heart failure protocols.  PCP is [facility], he also gets medications from them.  Patient is followed by the heart failure clinic.  Patient reports that currently he has personal care services with [facility].  Patient also reports that he wears oxygen or is supposed to wear oxygen, he does not know the name of the company.  Patient is from home where he reports he mostly lives alone, he also says that he can drive, he states he is not married.  He has no equipment but thinks that he could use a cane.  PT consult placed.  RNCM will cont to follow. Doran Clay RN BSN Care Manager  [phone]                 Action/Plan: Home Health Heart Failure protocol PT Consult  Expected Discharge Date:  06/01/18               Expected Discharge Plan:  [place]  In-House Referral:     Discharge planning Services  CM Consult  Post Acute Care Choice:    Choice offered to:     DME Arranged:    DME Agency:     HH Arranged:    HH Agency:     Status of Service:  In process, will continue to follow  If discussed at Long Length of Stay Meetings, dates discussed:    Additional Comments:  Shelbie Hutching, RN 05/29/2018, 11:51 AM

## 2018-05-29 NOTE — Progress Notes (Signed)
Pt transferred to room 248 on 2A after lunch per Dr Patsey Berthold.  Transferred via wheelchair with O2 tank.  Belongings and chart transferred with him.  PT's cell phone and charger in room 248, visualized by writing RN.  His sister is at bedside.  Report given to Tammy, RN on 2A.   Pt's VSS on 4 liters [place].  Order for BiPap HS placed per Dr Patsey Berthold.  RT Jenny Reichmann will transfer the BiPap to 2A.

## 2018-05-30 LAB — BASIC METABOLIC PANEL
Anion gap: 3 — ABNORMAL LOW (ref 5–15)
BUN: 19 mg/dL (ref 6–20)
CO2: 38 mmol/L — ABNORMAL HIGH (ref 22–32)
Calcium: 8 mg/dL — ABNORMAL LOW (ref 8.9–10.3)
Chloride: 98 mmol/L (ref 98–111)
Creatinine, Ser: 1 mg/dL (ref 0.61–1.24)
GFR calc Af Amer: >60 mL/min (ref >60)
GFR calc non Af Amer: >60 mL/min (ref >60)
Glucose, Bld: 77 mg/dL (ref 70–99)
Potassium: 3.8 mmol/L (ref 3.5–5.1)
Sodium: 139 mmol/L (ref 135–145)

## 2018-05-30 LAB — GLUCOSE, CAPILLARY: Glucose-Capillary: 134 mg/dL — ABNORMAL HIGH (ref 70–99)

## 2018-05-30 LAB — HIV ANTIBODY (ROUTINE TESTING W REFLEX): HIV Screen 4th Generation wRfx: NONREACTIVE

## 2018-05-30 NOTE — Care Management Note (Addendum)
Case Management Note  Patient Details  Name: Gary [name] MRN: [id number] Date of Birth: 09-13-1955  Subjective/Objective:        Patient is from home alone.  Admitted with acute on chronic CHF.  Receiving IV lasix.  This RNCM to bedside to ask about [facility] services; home health list was previously given.  Referral made to [facility] with [facility] for HF protocol, RN, PT and aide.  Accepted by Helene Kelp.  Will notify her when patient discharges.   Current with heart Failure Clinic.             Action/Plan:   Expected Discharge Date:  06/01/18               Expected Discharge Plan:  [place]  In-House Referral:     Discharge planning Services  CM Consult  Post Acute Care Choice:  Home Health Choice offered to:  Patient  DME Arranged:    DME Agency:     HH Arranged:  RN, PT, Nurse's Aide(HF protocol) HH Agency:     Status of Service:  Completed, signed off  If discussed at [place] of Stay Meetings, dates discussed:    Additional Comments:  Elza Rafter, RN 05/30/2018, 4:19 PM

## 2018-05-30 NOTE — Progress Notes (Signed)
[place] at [facility]                                                                                                                                                                                  Patient Demographics   Gary [name], is a 63 y.o. male, DOB - 1956/03/09, LPF:[id number]  Admit date - 05/28/2018   Admitting Physician Vaughan Basta, MD  Outpatient Primary MD for the patient is [facility]   LOS - 2  Subjective: Patient doing better continue to have urine output Patient has sleep apnea I asked him about his CPAP machine he says that he is not sure if he has a machine he thinks that he may have a machine He also uses oxygen intermittently and has some oxygen at home.  Review of Systems:   CONSTITUTIONAL: No documented fever. No fatigue, weakness. No weight gain, no weight loss.  EYES: No blurry or double vision.  ENT: No tinnitus. No postnasal drip. No redness of the oropharynx.  RESPIRATORY: No cough, no wheeze, no hemoptysis.  Positive dyspnea.  CARDIOVASCULAR: No chest pain. No orthopnea. No palpitations. No syncope.  GASTROINTESTINAL: No nausea, no vomiting or diarrhea. No abdominal pain. No melena or hematochezia.  GENITOURINARY: No dysuria or hematuria.  ENDOCRINE: No polyuria or nocturia. No heat or cold intolerance.  HEMATOLOGY: No anemia. No bruising. No bleeding.  INTEGUMENTARY: No rashes. No lesions.  MUSCULOSKELETAL: No arthritis. No swelling. No gout.  NEUROLOGIC: No numbness, tingling, or ataxia. No seizure-type activity.  PSYCHIATRIC: No anxiety. No insomnia. No ADD.    Vitals:   Vitals:   05/29/18 2022 05/29/18 2023 05/30/18 0417 05/30/18 0803  BP: (!) 124/94  125/81 136/84  Pulse: 75 78 70 73  Resp: 18  19 20   Temp: 98 F (36.7 C)  97.8 F (36.6 C) 98.3 F (36.8 C)  TempSrc:    Oral  SpO2: (!) 84% 98% 92% (!) 85%  Weight:   (!) 169.3 kg   Height:        Wt Readings from Last 3  Encounters:  05/30/18 (!) 169.3 kg  05/19/18 (!) 175.6 kg  02/17/18 (!) 169.1 kg     Intake/Output Summary (Last 24 hours) at 05/30/2018 1515 Last data filed at 05/30/2018 1419 Gross per 24 hour  Intake 960 ml  Output 4325 ml  Net -3365 ml    Physical Exam:   GENERAL: Pleasant-appearing in no apparent distress.  Morbidly obese HEAD, EYES, EARS, NOSE AND THROAT: Atraumatic, normocephalic. Extraocular muscles are intact. Pupils equal and reactive to light. Sclerae anicteric. No conjunctival injection. No oro-pharyngeal erythema.  NECK: Supple. There is no jugular  venous distention. No bruits, no lymphadenopathy, no thyromegaly.  HEART: Regular rate and rhythm,. No murmurs, no rubs, no clicks.  LUNGS: Crackles at the bases ABDOMEN: Soft, flat, nontender, nondistended. Has good bowel sounds. No hepatosplenomegaly appreciated.  EXTREMITIES: No evidence of any cyanosis, clubbing, or 1+ peripheral edema.  +2 pedal and radial pulses bilaterally.  NEUROLOGIC: The patient is alert, awake, and oriented x3 with no focal motor or sensory deficits appreciated bilaterally.  SKIN: Moist and warm with no rashes appreciated.  Psych: Not anxious, depressed LN: No inguinal LN enlargement    Antibiotics   Anti-infectives (From admission, onward)   None      Medications   Scheduled Meds: . aspirin  81 mg Oral Daily  . carvedilol  6.25 mg Oral BID WC  . furosemide  40 mg Intravenous TID  . heparin  5,000 Units Subcutaneous Q8H  . lisinopril  20 mg Oral Daily  . potassium chloride SA  20 mEq Oral BID  . simvastatin  10 mg Oral Daily  . sodium chloride flush  3 mL Intravenous Q12H   Continuous Infusions: . sodium chloride     PRN Meds:.sodium chloride, acetaminophen, ipratropium-albuterol, ondansetron (ZOFRAN) IV, sodium chloride flush   Data Review:   Micro Results Recent Results (from the past 240 hour(s))  MRSA PCR Screening     Status: None   Collection Time: 05/29/18 12:32 AM   Result Value Ref Range Status   MRSA by PCR NEGATIVE NEGATIVE Final    Comment:        The GeneXpert MRSA Assay (FDA approved for NASAL specimens only), is one component of a comprehensive MRSA colonization surveillance program. It is not intended to diagnose MRSA infection nor to guide or monitor treatment for MRSA infections. Performed at [facility], [address], [place], [place] [address]     Radiology Reports Dg Chest 2 View  Result Date: 05/28/2018 CLINICAL DATA:  Congestive heart failure. Weakness. Shortness of breath. EXAM: CHEST - 2 VIEW COMPARISON:  04/12/2017. FINDINGS: Cardiomegaly. Vascular prominence, with interstitial and alveolar opacities most consistent with CHF. No significant effusion. No pneumothorax. Bones unremarkable. Similar appearance to priors. IMPRESSION: Cardiomegaly with CHF. Similar appearance to priors. Electronically Signed   By: Staci Righter M.D.   On: 05/28/2018 15:27   Dg Chest Port 1 View  Result Date: 05/29/2018 CLINICAL DATA:  Congestive Heart Failure per ordering notes. Patient unable to give adequate history at this time. EXAM: PORTABLE CHEST 1 VIEW COMPARISON:  05/28/2018 FINDINGS: Stable enlarged cardiac silhouette. Central venous congestion and mild interstitial edema pattern similar prior. Low lung volumes. No pneumothorax. No focal consolidation. IMPRESSION: No significant change. Cardiomegaly, central venous congestion and mild interstitial edema. Electronically Signed   By: Suzy Bouchard M.D.   On: 05/29/2018 10:53     CBC Recent Labs  Lab 05/28/18 1444 05/29/18 0447  WBC 7.1 7.4  HGB 15.9 14.7  HCT 58.2* 55.6*  PLT 129* 127*  MCV 83.1 84.6  MCH 22.7* 22.4*  MCHC 27.3* 26.4*  RDW 18.2* 17.9*    Chemistries  Recent Labs  Lab 05/28/18 1444 05/29/18 0028 05/30/18 0419  NA 143 145 139  K 3.8 4.0 3.8  CL 102 103 98  CO2 37* 38* 38*  GLUCOSE 110* 88 77  BUN 13 13 19   CREATININE 0.88 0.91 1.00  CALCIUM  8.6* 8.1* 8.0*   ------------------------------------------------------------------------------------------------------------------ estimated creatinine clearance is 131.6 mL/min (by C-G formula based on SCr of 1 mg/dL). ------------------------------------------------------------------------------------------------------------------ No  results for input(s): HGBA1C in the last 72 hours. ------------------------------------------------------------------------------------------------------------------ No results for input(s): CHOL, HDL, LDLCALC, TRIG, CHOLHDL, LDLDIRECT in the last 72 hours. ------------------------------------------------------------------------------------------------------------------ No results for input(s): TSH, T4TOTAL, T3FREE, THYROIDAB in the last 72 hours.  Invalid input(s): FREET3 ------------------------------------------------------------------------------------------------------------------ No results for input(s): VITAMINB12, FOLATE, FERRITIN, TIBC, IRON, RETICCTPCT in the last 72 hours.  Coagulation profile No results for input(s): INR, PROTIME in the last 168 hours.  No results for input(s): DDIMER in the last 72 hours.  Cardiac Enzymes Recent Labs  Lab 05/28/18 1444  TROPONINI <0.03   ------------------------------------------------------------------------------------------------------------------ Invalid input(s): POCBNP    Assessment & Plan   *Acute respiratory failure with hypoxia and hypercapnia Secondary to acute diastolic congestive heart failure and sleep apnea Continue Lasix  *Likely has obesity hypoventilation syndrome Noncompliant with CPAP   *Acute on chronic diastolic congestive heart failure Continue IV Lasix, fluid restriction, intake and output measurement. Will need to follow-up in the heart failure clinic  *Hypertension Continue Coreg and lisinopril.  *Hyperlipidemia Continue simvastatin.     Code Status Orders   (From admission, onward)         Start     Ordered   05/29/18 0001  Full code  Continuous     05/29/18 0001        Code Status History    Date Active Date Inactive Code Status Order ID Comments User Context   04/12/2017 1353 04/15/2017 1720 Full Code [id number]  Fritzi Mandes, MD Inpatient   01/10/2015 1953 01/14/2015 1703 Full Code [id number]  Aldean Jewett, MD Inpatient   03/27/2014 1847 04/02/2014 1512 Full Code [id number]  Doreen Salvage, MD ED           Consults pulmonary critical care  DVT Prophylaxis  Lovenox  Lab Results  Component Value Date   PLT 127 (L) 05/29/2018     Time Spent in minutes   35 minutes greater than 50% of time spent in care coordination and counseling patient regarding the condition and plan of care.   Dustin Flock M.D on 05/30/2018 at 3:15 PM  Between 7am to 6pm - Pager - [phone]  After 6pm go to [email or web address] - Proofreader  [employer]   Office  [phone]

## 2018-05-30 NOTE — Evaluation (Signed)
Physical Therapy Evaluation Patient Details Name: Gary [name] MRN: [id number] DOB: 12-15-1955 Today's Date: 05/30/2018   History of Present Illness  63 y.o. male with a known history of CHF, GI bleed, hypertension, hypoxia, morbid obesity, multiple gastric ulcers, sleep apnea-came to hospital with worsening shortness of breath in spite of taking his Lasix.    Clinical Impression  Pt did well with PT exam and showed good confidence and execution with functional mobility, ambulation and balance. He had some minimal issues with O2 drop during activity, but is at/near baseline and should be able to return home w/o issue. PT signing off.     Follow Up Recommendations No PT follow up    Equipment Recommendations  None recommended by PT    Recommendations for Other Services       Precautions / Restrictions Precautions Precautions: None Restrictions Weight Bearing Restrictions: No      Mobility  Bed Mobility Overal bed mobility: Independent             General bed mobility comments: Pt easily gets himself to sitting EOB  Transfers Overall transfer level: Independent Equipment used: None             General transfer comment: Pt rises to standing with good confidence and safety  Ambulation/Gait Ambulation/Gait assistance: Supervision Gait Distance (Feet): 200 Feet Assistive device: None       General Gait Details: Pt with consistent and safety ambulation.  Some lateral lean to WBing side but no LOBs or safety concerns, 2L O2 t/o the effort with drop to high 80s sats  Stairs            Wheelchair Mobility    Modified Rankin (Stroke Patients Only)       Balance Overall balance assessment: Modified Independent                                           Pertinent Vitals/Pain Pain Assessment: No/denies pain    Home Living Family/patient expects to be discharged to:: Private residence Living Arrangements: Other  relatives(son) Available Help at Discharge: Family   Home Access: Level entry              Prior Function Level of Independence: Independent               Hand Dominance        Extremity/Trunk Assessment   Upper Extremity Assessment Upper Extremity Assessment: Overall WFL for tasks assessed    Lower Extremity Assessment Lower Extremity Assessment: Overall WFL for tasks assessed       Communication   Communication: No difficulties  Cognition Arousal/Alertness: Awake/alert Behavior During Therapy: WFL for tasks assessed/performed Overall Cognitive Status: Within Functional Limits for tasks assessed                                        General Comments      Exercises     Assessment/Plan    PT Assessment Patent does not need any further PT services  PT Problem List         PT Treatment Interventions      PT Goals (Current goals can be found in the Care Plan section)  Acute Rehab PT Goals Patient Stated Goal: go home PT Goal Formulation: All assessment and  education complete, DC therapy    Frequency     Barriers to discharge        Co-evaluation               AM-PAC PT "6 Clicks" Mobility  Outcome Measure Help needed turning from your back to your side while in a flat bed without using bedrails?: None Help needed moving from lying on your back to sitting on the side of a flat bed without using bedrails?: None Help needed moving to and from a bed to a chair (including a wheelchair)?: None Help needed standing up from a chair using your arms (e.g., wheelchair or bedside chair)?: None Help needed to walk in hospital room?: None Help needed climbing 3-5 steps with a railing? : None 6 Click Score: 24    End of Session Equipment Utilized During Treatment: Gait belt Activity Tolerance: Patient tolerated treatment well Patient left: in chair;with call bell/phone within reach Nurse Communication: Mobility status       Time: 5015-8682 PT Time Calculation (min) (ACUTE ONLY): 25 min   Charges:   PT Evaluation $PT Eval Low Complexity: 1 Low          Kreg Shropshire, DPT 05/30/2018, 3:45 PM

## 2018-05-30 NOTE — Progress Notes (Signed)
Informed by RN that pt will not keep Bipap mask on and has removed it. Asked pt this A.M. approx 0400 if he would like to try the Bipap again or maybe adjust the settings. Pt continues to refuse Bipap.

## 2018-05-30 NOTE — Progress Notes (Signed)
RT to patient bedside to assist with Bipap. Patient stated he did not want to wear the Bipap tonight. Patient had also taken his [place] off. Patient SAT on room air at 86%. 2L [place] placed back on patient. SAT increased to 92%, patient informed to keep his oxygen on and to call if breathing becomes labored or if he feels short of breath.

## 2018-05-31 LAB — BASIC METABOLIC PANEL
Anion gap: 6 (ref 5–15)
BUN: 22 mg/dL — ABNORMAL HIGH (ref 6–20)
CO2: 32 mmol/L (ref 22–32)
Calcium: 8.2 mg/dL — ABNORMAL LOW (ref 8.9–10.3)
Chloride: 100 mmol/L (ref 98–111)
Creatinine, Ser: 0.82 mg/dL (ref 0.61–1.24)
GFR calc Af Amer: >60 mL/min (ref >60)
GFR calc non Af Amer: >60 mL/min (ref >60)
Glucose, Bld: 85 mg/dL (ref 70–99)
Potassium: 4.4 mmol/L (ref 3.5–5.1)
Sodium: 138 mmol/L (ref 135–145)

## 2018-05-31 MED ORDER — PNEUMOCOCCAL 13-VAL CONJ VACC IM SUSP: 0.5000 mL | INTRAMUSCULAR | Status: DC

## 2018-05-31 MED ORDER — PNEUMOCOCCAL VAC POLYVALENT 25 MCG/0.5ML IJ INJ
0.5000 mL | INJECTION | INTRAMUSCULAR | Status: AC
  Administered 2018-05-31: 0.5 mL via INTRAMUSCULAR

## 2018-05-31 NOTE — Care Management Note (Signed)
Case Management Note  Patient Details  Name: Gary [name] MRN: [id number] Date of Birth: 13-Sep-1955  Subjective/Objective:   Patient to be discharged per MD order. Orders in place for home health services. Previous RNCM had began workup for home health with [facility] using the CHF protocol. Signed protocol faxed to [facility]. Helene Kelp aware of pending discharge. Family to transport .                 Action/Plan:   Expected Discharge Date:  05/31/18               Expected Discharge Plan:  [place]  In-House Referral:     Discharge planning Services  CM Consult  Post Acute Care Choice:  Home Health Choice offered to:  Patient  DME Arranged:    DME Agency:     HH Arranged:  RN, PT, Nurse's Aide(HF protocol) HH Agency:     Status of Service:  Completed, signed off  If discussed at [place] of Stay Meetings, dates discussed:    Additional Comments:  Latanya Maudlin, RN 05/31/2018, 12:13 PM

## 2018-05-31 NOTE — Plan of Care (Signed)
  Problem: Activity: Goal: Risk for activity intolerance will decrease Outcome: Progressing   Problem: Pain Managment: Goal: General experience of comfort will improve Outcome: Progressing   Problem: Safety: Goal: Ability to remain free from injury will improve Outcome: Progressing   Problem: Skin Integrity: Goal: Risk for impaired skin integrity will decrease Outcome: Progressing   Problem: Cardiac: Goal: Ability to achieve and maintain adequate cardiopulmonary perfusion will improve Outcome: Progressing

## 2018-05-31 NOTE — Discharge Instructions (Signed)
It was so nice to meet you during this hospitalization!  You came into the hospital with a worsening of your heart failure. We gave you lasix through your IV. Please make sure you continue taking the lasix at home.  Take care, Dr. Brett Albino
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
## 2018-06-02 ENCOUNTER — Telehealth: Payer: Self-pay

## 2018-06-02 NOTE — Telephone Encounter (Signed)
-----   Message from Virgel Manifold, MD sent at 05/30/2018 10:13 AM EST ----- Regarding: FW: Scheduled colonoscopy Yale Golla or Sharyn Lull, could you please see the below message. Thank you! ----- Message ----- From: Shelbie Hutching, RN Sent: 05/29/2018   3:00 PM EST To: Virgel Manifold, MD Subject: Scheduled colonoscopy                          Good afternoon Dr. Bonna Gains I am a case manager for the ICU and this patient was admitted to [place] for CHF exacerbation.  He informed me that he had a colonoscopy coming up soon but could not remember when and he lost all the paper work.  I was able to look in Epic and see that he is scheduled with you on Wed Feb 19th.  Patient reports that he needs a new prescription for the prep and information on what he needs to do pre procedure.  He is floor status today and should discharge home before next week.  If you could have one of your RN's or secretary's give him a call to go over any information he needs that would be great.  Thank you, Doran Clay RN BSN Care Management  [phone]

## 2018-06-03 ENCOUNTER — Encounter: Payer: Self-pay | Admitting: *Deleted

## 2018-06-04 ENCOUNTER — Ambulatory Visit: Payer: Medicaid Other | Admitting: Anesthesiology

## 2018-06-04 ENCOUNTER — Telehealth: Payer: Self-pay | Admitting: Gastroenterology

## 2018-06-04 ENCOUNTER — Ambulatory Visit: Admission: RE | Admit: 2018-06-04 | Payer: Medicaid Other | Source: Ambulatory Visit | Admitting: Gastroenterology

## 2018-06-04 ENCOUNTER — Encounter
Admission: RE | Disposition: A | Discharge status: Home / Self Care | Payer: Self-pay | Source: Ambulatory Visit | Attending: Gastroenterology

## 2018-06-04 ENCOUNTER — Encounter: Payer: Self-pay | Admitting: *Deleted

## 2018-06-04 ENCOUNTER — Encounter: Admission: RE | Payer: Self-pay | Source: Ambulatory Visit

## 2018-06-04 ENCOUNTER — Ambulatory Visit
Admission: RE | Admit: 2018-06-04 | Discharge: 2018-06-04 | Disposition: A | Discharge status: Home / Self Care | Payer: Medicaid Other | Source: Ambulatory Visit | Attending: Gastroenterology | Admitting: Gastroenterology

## 2018-06-04 DIAGNOSIS — Z8719 Personal history of other diseases of the digestive system: Secondary | ICD-10-CM | POA: Insufficient documentation

## 2018-06-04 DIAGNOSIS — Z79899 Other long term (current) drug therapy: Secondary | ICD-10-CM | POA: Insufficient documentation

## 2018-06-04 DIAGNOSIS — K529 Noninfective gastroenteritis and colitis, unspecified: Secondary | ICD-10-CM | POA: Diagnosis not present

## 2018-06-04 DIAGNOSIS — Z980 Intestinal bypass and anastomosis status: Secondary | ICD-10-CM | POA: Diagnosis not present

## 2018-06-04 DIAGNOSIS — Z6841 Body Mass Index (BMI) 40.0 and over, adult: Secondary | ICD-10-CM | POA: Diagnosis not present

## 2018-06-04 DIAGNOSIS — I509 Heart failure, unspecified: Secondary | ICD-10-CM | POA: Diagnosis not present

## 2018-06-04 DIAGNOSIS — Z7982 Long term (current) use of aspirin: Secondary | ICD-10-CM | POA: Insufficient documentation

## 2018-06-04 DIAGNOSIS — F17210 Nicotine dependence, cigarettes, uncomplicated: Secondary | ICD-10-CM | POA: Insufficient documentation

## 2018-06-04 DIAGNOSIS — K621 Rectal polyp: Secondary | ICD-10-CM | POA: Insufficient documentation

## 2018-06-04 DIAGNOSIS — K6389 Other specified diseases of intestine: Secondary | ICD-10-CM

## 2018-06-04 DIAGNOSIS — I110 Hypertensive heart disease with heart failure: Secondary | ICD-10-CM | POA: Insufficient documentation

## 2018-06-04 DIAGNOSIS — K5730 Diverticulosis of large intestine without perforation or abscess without bleeding: Secondary | ICD-10-CM | POA: Insufficient documentation

## 2018-06-04 DIAGNOSIS — Z9049 Acquired absence of other specified parts of digestive tract: Secondary | ICD-10-CM | POA: Insufficient documentation

## 2018-06-04 DIAGNOSIS — G4730 Sleep apnea, unspecified: Secondary | ICD-10-CM | POA: Diagnosis not present

## 2018-06-04 DIAGNOSIS — D124 Benign neoplasm of descending colon: Secondary | ICD-10-CM | POA: Diagnosis not present

## 2018-06-04 DIAGNOSIS — R195 Other fecal abnormalities: Secondary | ICD-10-CM | POA: Insufficient documentation

## 2018-06-04 HISTORY — PX: COLONOSCOPY WITH PROPOFOL: SHX5780

## 2018-06-04 SURGERY — COLONOSCOPY WITH PROPOFOL
Anesthesia: General

## 2018-06-04 MED ORDER — LIDOCAINE HCL URETHRAL/MUCOSAL 2 % EX GEL
CUTANEOUS | Status: AC
  Filled 2018-06-04: qty 5

## 2018-06-04 MED ORDER — FENTANYL CITRATE (PF) 100 MCG/2ML IJ SOLN
INTRAMUSCULAR | Status: DC | PRN
  Administered 2018-06-04 (×2): 50 ug via INTRAVENOUS

## 2018-06-04 MED ORDER — PHENYLEPHRINE HCL 10 MG/ML IJ SOLN
INTRAMUSCULAR | Status: DC | PRN
  Administered 2018-06-04 (×2): 100 ug via INTRAVENOUS

## 2018-06-04 MED ORDER — SODIUM CHLORIDE 0.9 % IV SOLN
INTRAVENOUS | Status: DC | PRN
  Administered 2018-06-04: 08:00:00 via INTRAVENOUS

## 2018-06-04 MED ORDER — LIDOCAINE 2% (20 MG/ML) 5 ML SYRINGE
INTRAMUSCULAR | Status: DC | PRN
  Administered 2018-06-04: 40 mg via INTRAVENOUS

## 2018-06-04 MED ORDER — PROPOFOL 10 MG/ML IV BOLUS
INTRAVENOUS | Status: AC
  Filled 2018-06-04: qty 20

## 2018-06-04 MED ORDER — FENTANYL CITRATE (PF) 100 MCG/2ML IJ SOLN
INTRAMUSCULAR | Status: AC
  Filled 2018-06-04: qty 2

## 2018-06-04 MED ORDER — PROPOFOL 500 MG/50ML IV EMUL
INTRAVENOUS | Status: AC
  Filled 2018-06-04: qty 50

## 2018-06-04 MED ORDER — GLYCOPYRROLATE 0.2 MG/ML IJ SOLN
INTRAMUSCULAR | Status: AC
  Filled 2018-06-04: qty 1

## 2018-06-04 MED ORDER — PROPOFOL 10 MG/ML IV BOLUS
INTRAVENOUS | Status: DC | PRN
  Administered 2018-06-04: 100 mg via INTRAVENOUS

## 2018-06-04 MED ORDER — SODIUM CHLORIDE 0.9 % IV SOLN
INTRAVENOUS | Status: DC
  Administered 2018-06-04: 08:00:00 via INTRAVENOUS

## 2018-06-04 MED ORDER — PROPOFOL 500 MG/50ML IV EMUL
INTRAVENOUS | Status: DC | PRN
  Administered 2018-06-04: 190 ug/kg/min via INTRAVENOUS

## 2018-06-04 NOTE — Op Note (Signed)
[facility] Gastroenterology Patient Name: Gary [name] Procedure Date: 06/04/2018 7:11 AM MRN: [id number] Account #: [id number] Date of Birth: [birth year] Admit Type: Ambulatory Age: 63 Room: [facility] ENDO ROOM 2 Gender: Male Note Status: Finalized Procedure:            Colonoscopy Indications:          Gastrointestinal occult blood loss Providers:            Sinead Hockman B. Bonna Gains MD, MD Medicines:            Monitored Anesthesia Care Complications:        No immediate complications. Procedure:            Pre-Anesthesia Assessment:                       - ASA Grade Assessment: III - A patient with severe                        systemic disease.                       - Prior to the procedure, a History and Physical was                        performed, and patient medications, allergies and                        sensitivities were reviewed. The patient's tolerance of                        previous anesthesia was reviewed.                       - The risks and benefits of the procedure and the                        sedation options and risks were discussed with the                        patient. All questions were answered and informed                        consent was obtained.                       - Patient identification and proposed procedure were                        verified prior to the procedure by the physician, the                        nurse, the anesthesiologist, the anesthetist and the                        technician. The procedure was verified in the procedure                        room.                       After obtaining informed consent, the colonoscope was  passed under direct vision. Throughout the procedure,                        the patient's blood pressure, pulse, and oxygen                        saturations were monitored continuously. The                        Colonoscope was introduced through the anus and                         advanced to the the ileocolonic anastomosis. The                        colonoscopy was performed with ease. The patient                        tolerated the procedure well. The quality of the bowel                        preparation was fair. Findings:      The perianal and digital rectal examinations were normal.      Three sessile polyps were found in the rectum and descending colon. The       polyps were 4 to 5 mm in size. These polyps were removed with a cold       snare. Resection and retrieval were complete.      A segmental area of moderately congested and erythematous mucosa was       found in the sigmoid colon. Biopsies were taken with a cold forceps for       histology. 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 The lumen in this area did not distend well with CO2.      Multiple diverticula were found in the sigmoid colon.      The exam was otherwise without abnormality.      The ileum, colon (entire examined portion) and anastomosis appeared       normal.      The retroflexed view of the distal rectum and anal verge was normal and       showed no anal or rectal abnormalities. Impression:           - Preparation of the colon was fair.                       - Three 4 to 5 mm polyps in the rectum and in the                        descending colon, removed with a cold snare. Resected                        and retrieved.                       - Congested and erythematous mucosa in the sigmoid                        colon. Biopsied.                       -  Diverticulosis in the sigmoid colon.                       - The examination was otherwise normal.                       - The distal rectum and anal verge are normal on                        retroflexion view. Recommendation:       - Discharge patient to home (with escort).                       - High fiber diet.                       - Advance diet as  tolerated.                       - Continue present medications.                       - Await pathology results.                       - Repeat colonoscopy date to be determined after                        pending pathology results are reviewed.                       - The findings and recommendations were discussed with                        the patient.                       - The findings and recommendations were discussed with                        the patient's family.                       - Return to primary care physician as previously                        scheduled.                       - Return to my office in 2 weeks.                       - Depending on biopsy results, we may need to consider                        CT Abdomen/Pelvis to evaluate the sigmoid colon                        erythematous area. Procedure Code(s):    --- Professional ---                       [phone], Colonoscopy, flexible; with removal of tumor(s),  polyp(s), or other lesion(s) by snare technique                       45380, 59, Colonoscopy, flexible; with biopsy, single                        or multiple Diagnosis Code(s):    --- Professional ---                       K62.1, Rectal polyp                       D12.4, Benign neoplasm of descending colon                       K63.89, Other specified diseases of intestine                       R19.5, Other fecal abnormalities                       K57.30, Diverticulosis of large intestine without                        perforation or abscess without bleeding CPT copyright 2018 American Medical Association. All rights reserved. The codes documented in this report are preliminary and upon coder review may  be revised to meet current compliance requirements.  [name], MD Margretta Sidle B. Bonna Gains MD, MD 06/04/2018 9:11:16 AM This report has been signed electronically. Number of Addenda: 0 Note Initiated On: 06/04/2018  7:11 AM Scope Withdrawal Time: 0 hours 27 minutes 42 seconds  Total Procedure Duration: 0 hours 34 minutes 30 seconds  Estimated Blood Loss: Estimated blood loss: none.      [facility]

## 2018-06-04 NOTE — Anesthesia Post-op Follow-up Note (Signed)
Anesthesia QCDR form completed.        

## 2018-06-04 NOTE — Anesthesia Procedure Notes (Signed)
Procedure Name: LMA Insertion Date/Time: 06/04/2018 8:34 AM Performed by: Courtney Paris, CRNA Pre-anesthesia Checklist: Patient identified, Emergency Drugs available, Suction available, Patient being monitored and Timeout performed Patient Re-evaluated:Patient Re-evaluated prior to induction Oxygen Delivery Method: Circle system utilized and Simple face mask Preoxygenation: Pre-oxygenation with 100% oxygen Induction Type: IV induction Ventilation: Mask ventilation with difficulty LMA: LMA inserted LMA Size: 4.0 Grade View: Grade III

## 2018-06-04 NOTE — Anesthesia Procedure Notes (Signed)
Procedure Name: LMA Insertion Performed by: Courtney Paris, CRNA Pre-anesthesia Checklist: Patient identified, Emergency Drugs available, Suction available, Patient being monitored and Timeout performed Patient Re-evaluated:Patient Re-evaluated prior to induction Oxygen Delivery Method: Non-rebreather mask Preoxygenation: Pre-oxygenation with 100% oxygen Induction Type: IV induction Ventilation: Mask ventilation with difficulty, Two handed mask ventilation required, Oral airway inserted - appropriate to patient size and Nasal airway inserted- appropriate to patient size LMA: LMA inserted LMA Size: 4.0 Grade View: Grade III Tube type: Oral

## 2018-06-04 NOTE — Anesthesia Postprocedure Evaluation (Signed)
Anesthesia Post Note  Patient: Gary [name]  Procedure(s) Performed: COLONOSCOPY WITH PROPOFOL (N/A )  Patient location during evaluation: Endoscopy Anesthesia Type: General Level of consciousness: awake and alert Pain management: pain level controlled Vital Signs Assessment: post-procedure vital signs reviewed and stable Respiratory status: spontaneous breathing and respiratory function stable Cardiovascular status: stable Anesthetic complications: no     Last Vitals:  Vitals:   06/04/18 0940 06/04/18 1010  BP: 122/77 108/75  Pulse: 80 83  Resp: 17 18  Temp:    SpO2: 100%     Last Pain:  Vitals:   06/04/18 1010  TempSrc:   PainSc: 0-No pain                 Meital Riehl K

## 2018-06-04 NOTE — Anesthesia Preprocedure Evaluation (Signed)
Anesthesia Evaluation  Patient identified by MRN, date of birth, ID band Patient awake    Reviewed: Allergy & Precautions, NPO status , Patient's Chart, lab work & pertinent test results, reviewed documented beta blocker date and time   History of Anesthesia Complications Negative for: history of anesthetic complications  Airway Mallampati: III       Dental  (+) Loose, Chipped, Poor Dentition   Pulmonary sleep apnea , neg COPD, Current Smoker,           Cardiovascular hypertension, Pt. on medications and Pt. on home beta blockers +CHF  (-) Past MI (-) dysrhythmias (-) Valvular Problems/Murmurs     Neuro/Psych neg Seizures    GI/Hepatic Neg liver ROS, PUD, neg GERD  ,  Endo/Other  neg diabetesMorbid obesity  Renal/GU negative Renal ROS     Musculoskeletal   Abdominal   Peds  Hematology   Anesthesia Other Findings   Reproductive/Obstetrics                             Anesthesia Physical Anesthesia Plan  ASA: III  Anesthesia Plan: General   Post-op Pain Management:    Induction: Intravenous  PONV Risk Score and Plan: 1 and Propofol infusion and TIVA  Airway Management Planned: Nasal Cannula  Additional Equipment:   Intra-op Plan:   Post-operative Plan:   Informed Consent: I have reviewed the patients History and Physical, chart, labs and discussed the procedure including the risks, benefits and alternatives for the proposed anesthesia with the patient or authorized representative who has indicated his/her understanding and acceptance.       Plan Discussed with:   Anesthesia Plan Comments:         Anesthesia Quick Evaluation
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
## 2018-06-04 NOTE — Transfer of Care (Signed)
Immediate Anesthesia Transfer of Care Note  Patient: Gary [name]  Procedure(s) Performed: COLONOSCOPY WITH PROPOFOL (N/A )  Patient Location: PACU and Endoscopy Unit  Anesthesia Type:General  Level of Consciousness: awake and patient cooperative  Airway & Oxygen Therapy: Patient Spontanous Breathing and Patient connected to face mask oxygen  Post-op Assessment: Report given to RN and Post -op Vital signs reviewed and stable  Post vital signs: Reviewed and stable  Last Vitals:  Vitals Value Taken Time  BP    Temp    Pulse    Resp    SpO2      Last Pain:  Vitals:   06/04/18 0930  TempSrc: (P) Tympanic  PainSc:          Complications: No apparent anesthesia complications

## 2018-06-04 NOTE — Telephone Encounter (Signed)
-----   Message from Virgel Manifold, MD sent at 06/04/2018  9:18 AM EST -----  Please set up clinic appointment with me in 2-4 weeks.

## 2018-06-04 NOTE — Telephone Encounter (Signed)
Attempted to call pt to schedule 2-4 week f/u with Dr. Darene Lamer no vm set up

## 2018-06-04 NOTE — OR Nursing (Signed)
Mom stated patient ate oatmeal this am. Anesthesia notified and patient cancelled. Discharged via ambulation.

## 2018-06-04 NOTE — Anesthesia Procedure Notes (Signed)
Performed by: Courtney Paris, CRNA

## 2018-06-05 ENCOUNTER — Encounter: Payer: Self-pay | Admitting: Gastroenterology

## 2018-06-05 LAB — SURGICAL PATHOLOGY

## 2018-06-06 ENCOUNTER — Ambulatory Visit: Payer: Medicaid Other | Admitting: Family

## 2018-06-09 ENCOUNTER — Ambulatory Visit: Payer: Medicaid Other | Admitting: Family

## 2018-06-09 ENCOUNTER — Telehealth: Payer: Self-pay | Admitting: Family

## 2018-06-09 NOTE — Telephone Encounter (Signed)
Patient did not show for his Heart Failure [facility] appointment on 06/09/2018. Will attempt to reschedule.
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
## 2018-06-10 NOTE — Progress Notes (Signed)
Patient scheduled to see me in clinic in the next couple weeks.  We will review results and discuss if he is having any symptoms from the SCAD, and consider antibiotic therapy as recommended in up-to-date.  We will also consider CT scan after discussing with the patient.  Repeat surveillance colonoscopy would be in 5 years due to polyps seen.
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
## 2018-06-23 ENCOUNTER — Ambulatory Visit: Payer: Medicaid Other | Admitting: Gastroenterology

## 2018-06-24 ENCOUNTER — Other Ambulatory Visit: Payer: Self-pay

## 2018-06-24 ENCOUNTER — Emergency Department: Payer: Medicaid Other

## 2018-06-24 ENCOUNTER — Inpatient Hospital Stay
Admission: EM | Admit: 2018-06-24 | Discharge: 2018-06-28 | DRG: 291 | Disposition: A | Discharge status: Home Health | Payer: Medicaid Other | Attending: Internal Medicine | Admitting: Internal Medicine

## 2018-06-24 ENCOUNTER — Encounter: Payer: Self-pay | Admitting: Emergency Medicine

## 2018-06-24 DIAGNOSIS — Z833 Family history of diabetes mellitus: Secondary | ICD-10-CM | POA: Diagnosis not present

## 2018-06-24 DIAGNOSIS — I110 Hypertensive heart disease with heart failure: Secondary | ICD-10-CM | POA: Diagnosis present

## 2018-06-24 DIAGNOSIS — I428 Other cardiomyopathies: Secondary | ICD-10-CM | POA: Diagnosis present

## 2018-06-24 DIAGNOSIS — Z9981 Dependence on supplemental oxygen: Secondary | ICD-10-CM

## 2018-06-24 DIAGNOSIS — Z823 Family history of stroke: Secondary | ICD-10-CM | POA: Diagnosis not present

## 2018-06-24 DIAGNOSIS — Z9114 Patient's other noncompliance with medication regimen: Secondary | ICD-10-CM | POA: Diagnosis not present

## 2018-06-24 DIAGNOSIS — J9622 Acute and chronic respiratory failure with hypercapnia: Secondary | ICD-10-CM | POA: Diagnosis present

## 2018-06-24 DIAGNOSIS — E662 Morbid (severe) obesity with alveolar hypoventilation: Secondary | ICD-10-CM | POA: Diagnosis present

## 2018-06-24 DIAGNOSIS — Z7982 Long term (current) use of aspirin: Secondary | ICD-10-CM

## 2018-06-24 DIAGNOSIS — F17210 Nicotine dependence, cigarettes, uncomplicated: Secondary | ICD-10-CM | POA: Diagnosis present

## 2018-06-24 DIAGNOSIS — G4730 Sleep apnea, unspecified: Secondary | ICD-10-CM | POA: Diagnosis present

## 2018-06-24 DIAGNOSIS — J810 Acute pulmonary edema: Secondary | ICD-10-CM

## 2018-06-24 DIAGNOSIS — J9620 Acute and chronic respiratory failure, unspecified whether with hypoxia or hypercapnia: Secondary | ICD-10-CM | POA: Diagnosis present

## 2018-06-24 DIAGNOSIS — I5043 Acute on chronic combined systolic (congestive) and diastolic (congestive) heart failure: Secondary | ICD-10-CM | POA: Diagnosis present

## 2018-06-24 DIAGNOSIS — I10 Essential (primary) hypertension: Secondary | ICD-10-CM | POA: Diagnosis present

## 2018-06-24 DIAGNOSIS — J9621 Acute and chronic respiratory failure with hypoxia: Secondary | ICD-10-CM | POA: Diagnosis present

## 2018-06-24 DIAGNOSIS — I959 Hypotension, unspecified: Secondary | ICD-10-CM | POA: Diagnosis present

## 2018-06-24 DIAGNOSIS — G4733 Obstructive sleep apnea (adult) (pediatric): Secondary | ICD-10-CM | POA: Diagnosis not present

## 2018-06-24 DIAGNOSIS — J9690 Respiratory failure, unspecified, unspecified whether with hypoxia or hypercapnia: Secondary | ICD-10-CM

## 2018-06-24 DIAGNOSIS — R0602 Shortness of breath: Secondary | ICD-10-CM | POA: Diagnosis present

## 2018-06-24 LAB — CBC
HCT: 54.5 % — ABNORMAL HIGH (ref 39.0–52.0)
Hemoglobin: 14.9 g/dL (ref 13.0–17.0)
MCH: 22 pg — ABNORMAL LOW (ref 26.0–34.0)
MCHC: 27.3 g/dL — ABNORMAL LOW (ref 30.0–36.0)
MCV: 80.6 fL (ref 80.0–100.0)
Platelets: 106 10*3/uL — ABNORMAL LOW (ref 150–400)
RBC: 6.76 MIL/uL — ABNORMAL HIGH (ref 4.22–5.81)
RDW: 18.6 % — ABNORMAL HIGH (ref 11.5–15.5)
WBC: 6.1 10*3/uL (ref 4.0–10.5)
nRBC: 1 % — ABNORMAL HIGH (ref 0.0–0.2)

## 2018-06-24 LAB — BASIC METABOLIC PANEL
Anion gap: 6 (ref 5–15)
BUN: 17 mg/dL (ref 6–20)
CO2: 35 mmol/L — ABNORMAL HIGH (ref 22–32)
Calcium: 8 mg/dL — ABNORMAL LOW (ref 8.9–10.3)
Chloride: 99 mmol/L (ref 98–111)
Creatinine, Ser: 1.07 mg/dL (ref 0.61–1.24)
GFR calc Af Amer: >60 mL/min (ref >60)
GFR calc non Af Amer: >60 mL/min (ref >60)
Glucose, Bld: 103 mg/dL — ABNORMAL HIGH (ref 70–99)
Potassium: 4.1 mmol/L (ref 3.5–5.1)
Sodium: 140 mmol/L (ref 135–145)

## 2018-06-24 LAB — BRAIN NATRIURETIC PEPTIDE: B Natriuretic Peptide: 251 pg/mL — ABNORMAL HIGH (ref 0.0–100.0)

## 2018-06-24 LAB — TROPONIN I: Troponin I: <0.03 ng/mL (ref <0.03)

## 2018-06-24 MED ORDER — CARVEDILOL 6.25 MG PO TABS
6.2500 mg | ORAL_TABLET | Freq: Two times a day (BID) | ORAL | Status: DC
  Administered 2018-06-25 – 2018-06-28 (×6): 6.25 mg via ORAL
  Filled 2018-06-24 (×6): qty 1

## 2018-06-24 MED ORDER — FUROSEMIDE 10 MG/ML IJ SOLN
60.0000 mg | Freq: Once | INTRAMUSCULAR | Status: AC
  Administered 2018-06-24: 60 mg via INTRAVENOUS
  Filled 2018-06-24: qty 8

## 2018-06-24 NOTE — ED Notes (Signed)
Went to ambulate pt per Dr. Ruffin Frederick orders, pt refused to ambulate stating "not right now". MD made aware.
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
## 2018-06-24 NOTE — ED Triage Notes (Signed)
Pt here for [facility] and hypoxia from PCP. Supposed to wear 2 L [place] at baseline. Pt does not wear his O2.  75% on RA.  Placed on his baseline 2 L [place].  sats in low-mid 90s once placed on his 2 L [place] he is supposed to be on. C/o swelling to BLE. Has not been taking his fluid pill. Mild tachypnea but unlabored. White productive cough. No fever.

## 2018-06-25 DIAGNOSIS — J9622 Acute and chronic respiratory failure with hypercapnia: Secondary | ICD-10-CM

## 2018-06-25 DIAGNOSIS — G4733 Obstructive sleep apnea (adult) (pediatric): Secondary | ICD-10-CM

## 2018-06-25 DIAGNOSIS — E662 Morbid (severe) obesity with alveolar hypoventilation: Secondary | ICD-10-CM

## 2018-06-25 LAB — CBC
HCT: 59.5 % — ABNORMAL HIGH (ref 39.0–52.0)
Hemoglobin: 15.3 g/dL (ref 13.0–17.0)
MCH: 21.5 pg — ABNORMAL LOW (ref 26.0–34.0)
MCHC: 25.7 g/dL — ABNORMAL LOW (ref 30.0–36.0)
MCV: 83.7 fL (ref 80.0–100.0)
Platelets: 111 10*3/uL — ABNORMAL LOW (ref 150–400)
RBC: 7.11 MIL/uL — ABNORMAL HIGH (ref 4.22–5.81)
RDW: 19.1 % — ABNORMAL HIGH (ref 11.5–15.5)
WBC: 7.6 10*3/uL (ref 4.0–10.5)
nRBC: 1.3 % — ABNORMAL HIGH (ref 0.0–0.2)

## 2018-06-25 LAB — BLOOD GAS, ARTERIAL
Acid-Base Excess: 8.5 mmol/L — ABNORMAL HIGH (ref 0.0–2.0)
Acid-Base Excess: 9 mmol/L — ABNORMAL HIGH (ref 0.0–2.0)
Bicarbonate: 43.5 mmol/L — ABNORMAL HIGH (ref 20.0–28.0)
Bicarbonate: 43.2 mmol/L — ABNORMAL HIGH (ref 20.0–28.0)
Delivery systems: POSITIVE
Expiratory PAP: 12
FIO2: 0.32
FIO2: 0.5
Inspiratory PAP: 18
Mechanical Rate: 10
O2 Saturation: 79.1 %
O2 Saturation: 93.8 %
Patient temperature: 37
Patient temperature: 37
RATE: 10 resp/min
pCO2 arterial: >120 mmHg (ref 32.0–48.0)
pCO2 arterial: >120 mmHg (ref 32.0–48.0)
pH, Arterial: 7.1 — CL (ref 7.350–7.450)
pH, Arterial: 7.13 — CL (ref 7.350–7.450)
pO2, Arterial: 59 mmHg — ABNORMAL LOW (ref 83.0–108.0)
pO2, Arterial: 90 mmHg (ref 83.0–108.0)

## 2018-06-25 LAB — BASIC METABOLIC PANEL
Anion gap: 7 (ref 5–15)
BUN: 13 mg/dL (ref 8–23)
CO2: 33 mmol/L — ABNORMAL HIGH (ref 22–32)
Calcium: 8 mg/dL — ABNORMAL LOW (ref 8.9–10.3)
Chloride: 101 mmol/L (ref 98–111)
Creatinine, Ser: 0.8 mg/dL (ref 0.61–1.24)
GFR calc Af Amer: >60 mL/min (ref >60)
GFR calc non Af Amer: >60 mL/min (ref >60)
Glucose, Bld: 114 mg/dL — ABNORMAL HIGH (ref 70–99)
Potassium: 4 mmol/L (ref 3.5–5.1)
Sodium: 141 mmol/L (ref 135–145)

## 2018-06-25 LAB — GLUCOSE, CAPILLARY
Glucose-Capillary: 142 mg/dL — ABNORMAL HIGH (ref 70–99)
Glucose-Capillary: 88 mg/dL (ref 70–99)

## 2018-06-25 MED ORDER — ASPIRIN 81 MG PO CHEW
81.0000 mg | CHEWABLE_TABLET | Freq: Every day | ORAL | Status: DC
  Administered 2018-06-26 – 2018-06-28 (×3): 81 mg via ORAL
  Filled 2018-06-25 (×3): qty 1

## 2018-06-25 MED ORDER — ENOXAPARIN SODIUM 40 MG/0.4ML ~~LOC~~ SOLN
40.0000 mg | Freq: Two times a day (BID) | SUBCUTANEOUS | Status: DC
  Administered 2018-06-25 – 2018-06-28 (×7): 40 mg via SUBCUTANEOUS
  Filled 2018-06-25 (×7): qty 0.4

## 2018-06-25 MED ORDER — ACETAMINOPHEN 325 MG PO TABS
650.0000 mg | ORAL_TABLET | Freq: Four times a day (QID) | ORAL | Status: DC | PRN
  Administered 2018-06-27 (×2): 650 mg via ORAL
  Filled 2018-06-25 (×2): qty 2

## 2018-06-25 MED ORDER — FUROSEMIDE 10 MG/ML IJ SOLN
60.0000 mg | Freq: Two times a day (BID) | INTRAMUSCULAR | Status: DC
  Administered 2018-06-25: 60 mg via INTRAVENOUS
  Filled 2018-06-25: qty 6

## 2018-06-25 MED ORDER — ONDANSETRON HCL 4 MG PO TABS: 4.0000 mg | ORAL_TABLET | Freq: Four times a day (QID) | ORAL | Status: DC | PRN

## 2018-06-25 MED ORDER — ACETAMINOPHEN 650 MG RE SUPP: 650.0000 mg | Freq: Four times a day (QID) | RECTAL | Status: DC | PRN

## 2018-06-25 MED ORDER — SIMVASTATIN 20 MG PO TABS
10.0000 mg | ORAL_TABLET | Freq: Every day | ORAL | Status: DC
  Administered 2018-06-25 – 2018-06-27 (×3): 10 mg via ORAL
  Filled 2018-06-25 (×4): qty 1

## 2018-06-25 MED ORDER — ONDANSETRON HCL 4 MG/2ML IJ SOLN: 4.0000 mg | Freq: Four times a day (QID) | INTRAMUSCULAR | Status: DC | PRN

## 2018-06-25 MED ORDER — LISINOPRIL 20 MG PO TABS
20.0000 mg | ORAL_TABLET | Freq: Every day | ORAL | Status: DC
  Administered 2018-06-26 – 2018-06-28 (×3): 20 mg via ORAL
  Filled 2018-06-25 (×3): qty 1
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
## 2018-06-25 NOTE — Progress Notes (Signed)
[place] [facility] [place] NAME: Gary [name]    MR#:  [id number]  DATE OF BIRTH:  1956/03/11  SUBJECTIVE:  CHIEF COMPLAINT:   Chief Complaint  Patient presents with  . Shortness of Breath   No new complaint this morning.  Patient lethargic.  Already placed on BiPAP prior to my arrival.  Wife at bedside.  Arrangements being made to transfer patient to the ICU.  Repeat ABG results reviewed.  REVIEW OF SYSTEMS:  ROS Unobtainable due to patient's clinical condition at this time  DRUG ALLERGIES:  No Known Allergies VITALS:  Blood pressure 126/82, pulse 75, temperature 98 F (36.7 C), temperature source Axillary, resp. rate 14, height 6\' 3"  (1.905 m), weight (!) 169.7 kg, SpO2 93 %. PHYSICAL EXAMINATION:   Physical Exam  Constitutional:  Morbidly obese.  Patient on BiPAP.  Lethargic.  HENT:  Head: Normocephalic and atraumatic.  Right Ear: External ear normal.  Eyes: Pupils are equal, round, and reactive to light. Conjunctivae and EOM are normal. Right eye exhibits no discharge.  Neck: Neck supple. No tracheal deviation present.  Cardiovascular: Normal rate, regular rhythm and normal heart sounds.  Respiratory: Effort normal. He has rales.  GI: Soft. Bowel sounds are normal. There is no abdominal tenderness.  Musculoskeletal:        General: No tenderness, deformity or edema.  Neurological:  Patient lethargic.  Not following command.  Arrangements being made to transfer patient to ICU for possible intubation  Skin: Skin is warm. He is not diaphoretic. No erythema.  Psychiatric:  Deferred due to current clinical condition   LABORATORY PANEL:  Male CBC Recent Labs  Lab 06/25/18 0421  WBC 7.6  HGB 15.3  HCT 59.5*  PLT 111*   ------------------------------------------------------------------------------------------------------------------ Chemistries  Recent Labs  Lab 06/25/18 0421  NA 141  K 4.0  CL 101  CO2 33*  GLUCOSE  114*  BUN 13  CREATININE 0.80  CALCIUM 8.0*   RADIOLOGY:  Dg Chest 2 View  Result Date: 06/24/2018 CLINICAL DATA:  Initial evaluation for acute shortness of breath, history of CHF. EXAM: CHEST - 2 VIEW COMPARISON:  Prior radiograph from 05/29/2018. FINDINGS: Moderate cardiomegaly, stable. Mediastinal silhouette within normal limits. Lungs mildly hypoinflated. Diffuse pulmonary vascular congestion with interstitial prominence, suggesting pulmonary interstitial edema. Superimposed streaky bibasilar opacities favored to reflect atelectasis. No other focal infiltrates. Trace bilateral pleural effusions. No pneumothorax. Osseous structures are unchanged. IMPRESSION: 1. Cardiomegaly with mild diffuse pulmonary interstitial edema and probable trace bilateral pleural effusions. 2. Low lung volumes with superimposed mild bibasilar atelectasis. Electronically Signed   By: Jeannine Boga M.D.   On: 06/24/2018 17:49   ASSESSMENT AND PLAN:   1.  Acute on chronic hypercapnic respiratory failure Most likely secondary to underlying obstructive sleep apnea/obesity hypoventilation syndrome. Wife reported patient does not use CPAP at home. Already placed on BiPAP prior to my evaluation.  Repeat ABG reviewed. I discussed case with critical care physician and arrangements already being made to have patient transferred to the ICU.  Patient may require intubation if no clinical improvement  2.  Acute on chronic diastolic CHF (congestive heart failure)  Being diuresed with IV Lasix.  Continue Coreg and ACE inhibitor. 2D echocardiogram done with ejection fraction of 60 to 65%.  Evidence of diastolic dysfunction. Follow-up on cardiology evaluation  3.  HTN (hypertension) -blood pressure controlled on current regimen   4. Sleep apnea -patient does not wear CPAP at home Patient currently on  BiPAP. Transferred to ICU from the medical floor due to respiratory failure  All the records are reviewed and case  discussed with Care Management/Social Worker. Management plans discussed with the patient, family and they are in agreement.  CODE STATUS: Full Code  TOTAL TIME TAKING CARE OF THIS PATIENT: 38 minutes.   More than 50% of the time was spent in counseling/coordination of care: YES  POSSIBLE D/C IN 3 DAYS, DEPENDING ON CLINICAL CONDITION.   Gary [name] M.D on 06/25/2018 at 1:38 PM  Between 7am to 6pm - Pager - [phone]  After 6pm go to [email or web address] - [employer] [facility]  Office  [phone]  CC: Primary care physician; Center, [facility]  Note: This dictation was prepared with Dragon dictation along with smaller phrase technology. Any transcriptional errors that result from this process are unintentional.

## 2018-06-25 NOTE — Progress Notes (Signed)
Patient was admitted from the ED following c/o of weight gain and SOB due to CHF. On admission patient was A&O X4, ambulatory and  accompanied significant other. Patient admission documentation was provided by patient and significant there. Denied being in pain. VS obtained, NSR on the monitor and patient indicated he wanted to rest.

## 2018-06-25 NOTE — Plan of Care (Signed)
  Problem: Education: Goal: Ability to demonstrate management of disease process will improve Outcome: Progressing Goal: Ability to verbalize understanding of medication therapies will improve Outcome: Progressing Goal: Individualized Educational Video(s) Outcome: Progressing   Problem: Activity: Goal: Capacity to carry out activities will improve Outcome: Progressing   Problem: Cardiac: Goal: Ability to achieve and maintain adequate cardiopulmonary perfusion will improve Outcome: Progressing   Problem: Education: Goal: Knowledge of General Education information will improve Description: Including pain rating scale, medication(s)/side effects and non-pharmacologic comfort measures Outcome: Progressing   Problem: Health Behavior/Discharge Planning: Goal: Ability to manage health-related needs will improve Outcome: Progressing   Problem: Clinical Measurements: Goal: Ability to maintain clinical measurements within normal limits will improve Outcome: Progressing Goal: Will remain free from infection Outcome: Progressing Goal: Diagnostic test results will improve Outcome: Progressing Goal: Respiratory complications will improve Outcome: Progressing Goal: Cardiovascular complication will be avoided Outcome: Progressing   Problem: Activity: Goal: Risk for activity intolerance will decrease Outcome: Progressing   Problem: Nutrition: Goal: Adequate nutrition will be maintained Outcome: Progressing   Problem: Coping: Goal: Level of anxiety will decrease Outcome: Progressing   Problem: Elimination: Goal: Will not experience complications related to bowel motility Outcome: Progressing Goal: Will not experience complications related to urinary retention Outcome: Progressing   Problem: Safety: Goal: Ability to remain free from injury will improve Outcome: Progressing   Problem: Skin Integrity: Goal: Risk for impaired skin integrity will decrease Outcome: Progressing    

## 2018-06-25 NOTE — Progress Notes (Signed)
Pt received to ICU # 6 via bed from 2A. Pt remains lethargic at this time. Opens eyes briefly when spoken to, follows some simple commands. Pt has voided large amount incontinently. Pt cleaned. Foley inserted as ordered. Will monitor I&O closely. Family in and update given.

## 2018-06-25 NOTE — Progress Notes (Signed)
Bedside report given to Webb Silversmith, RN.  All questions answered.  Will continue to monitor.

## 2018-06-25 NOTE — Progress Notes (Signed)
RT called to patient bedside for ABG. Per ABG results and MD verbal order, patient placed on Bipap.

## 2018-06-25 NOTE — Progress Notes (Signed)
In to patient room.  Pt alert, oriented x 4 saying he is hungry.  Family at bedside.  Will continue to monitor.

## 2018-06-25 NOTE — Progress Notes (Signed)
Report received from [place], [place].  All questions answered.  Pt in bed, bed locked and in low position.  Family at bedside.

## 2018-06-25 NOTE — Progress Notes (Signed)
Patient remained on 3L of supplemental oxygen with O2@sat  in the lo 90s. Patient was difficult to arouse by the care RN. Dr. Marcille Blanco was notified of patient's status, blood glucose was obtained, and ABG wsa completed per order. Patient was placed on BiPaP per order while patient was waiting on room availability in the ICU. Report given to day shift RN.

## 2018-06-25 NOTE — Progress Notes (Signed)
Patient placed on Bi-PAP by RT at shift change this morning, this RN to monitor patient and consol wife at bedside. Patient has been fairly unresponsive for this RN with only awakening to stimuli x2. Report given to [facility] in ICU and patient transferred with RT on BiPAP. Wife with patient up to date.

## 2018-06-26 ENCOUNTER — Inpatient Hospital Stay: Payer: Medicaid Other

## 2018-06-26 LAB — CBC
HCT: 55.8 % — ABNORMAL HIGH (ref 39.0–52.0)
Hemoglobin: 14.6 g/dL (ref 13.0–17.0)
MCH: 21.9 pg — ABNORMAL LOW (ref 26.0–34.0)
MCHC: 26.2 g/dL — ABNORMAL LOW (ref 30.0–36.0)
MCV: 83.5 fL (ref 80.0–100.0)
Platelets: 98 10*3/uL — ABNORMAL LOW (ref 150–400)
RBC: 6.68 MIL/uL — ABNORMAL HIGH (ref 4.22–5.81)
RDW: 18.7 % — ABNORMAL HIGH (ref 11.5–15.5)
WBC: 6.4 10*3/uL (ref 4.0–10.5)
nRBC: 1.1 % — ABNORMAL HIGH (ref 0.0–0.2)

## 2018-06-26 LAB — TSH: TSH: 0.159 u[IU]/mL — ABNORMAL LOW (ref 0.350–4.500)

## 2018-06-26 LAB — BASIC METABOLIC PANEL
Anion gap: 7 (ref 5–15)
BUN: 20 mg/dL (ref 8–23)
CO2: 34 mmol/L — ABNORMAL HIGH (ref 22–32)
Calcium: 7.9 mg/dL — ABNORMAL LOW (ref 8.9–10.3)
Chloride: 100 mmol/L (ref 98–111)
Creatinine, Ser: 0.85 mg/dL (ref 0.61–1.24)
GFR calc Af Amer: >60 mL/min (ref >60)
GFR calc non Af Amer: >60 mL/min (ref >60)
Glucose, Bld: 92 mg/dL (ref 70–99)
Potassium: 4.4 mmol/L (ref 3.5–5.1)
Sodium: 141 mmol/L (ref 135–145)

## 2018-06-26 MED ORDER — MODAFINIL 100 MG PO TABS
100.0000 mg | ORAL_TABLET | Freq: Every day | ORAL | Status: DC
  Administered 2018-06-26 – 2018-06-28 (×3): 100 mg via ORAL
  Filled 2018-06-26 (×4): qty 1

## 2018-06-26 NOTE — Progress Notes (Signed)
PT Cancellation Note  Patient Details Name: Gary [name] MRN: [id number] DOB: May 10, 1955   Cancelled Treatment:    Reason Eval/Treat Not Completed: Patient declined, no reason specified Chart review, eval initiated, PLOF/living situation gathered.  Set room up for ambulation with O2 and walker and pt states "Not today. No I ain't leaving any time soon why you want to walk me today."  Plenty of encouragement to at least do a little and ultimately he continued to refuse indicating that it would be ridiculous to try to get him to do anything today...  He agreed that he would try tomorrow?  Kreg Shropshire, DPT 06/26/2018, 5:08 PM

## 2018-06-26 NOTE — Progress Notes (Signed)
PCCM PROGRESS NOTE   PT PROFILE: 63 y.o. male smoker with history of untreated OSA, morbid obesity, hypertension, heart failure with preserved LVEF adm 03/10 via [facility] ED with chief complaint of dyspnea and admission diagnosis of congestive heart failure.  On the morning of 3/11 he was difficult to arouse and ABG revealed severe hypercarbia prompting initiation of BiPAP and transferred to SDU  SUBJ: Lethargic but appropriate.  Oriented to person and place.  Desaturates when he falls asleep.  Tolerated BiPAP well last night.  Vitals:   06/26/18 1000 06/26/18 1100 06/26/18 1200 06/26/18 1300  BP: (!) 125/114 122/75 113/65 115/74  Pulse: 84 77 84 80  Resp:    (!) 25  Temp:      TempSrc:      SpO2:      Weight:      Height:      [place] 1 LPM  EXAM:  Gen: Lethargic, NAD HEENT: NCAT, sclera white, oropharynx normal Neck: Supple, JVP cannot be visualized Lungs: Clear anteriorly Cardiovascular: Regular, no M Abdomen: Obese, soft, nontender, normal BS Ext: Warm, 1+ symmetric pretibial and ankle edema Neuro: No focal deficits Skin: Limited exam, no lesions noted  DATA:   BMP Latest Ref Rng & Units 06/26/2018 06/25/2018 06/24/2018  Glucose 70 - 99 mg/dL 92 114(H) 103(H)  BUN 8 - 23 mg/dL 20 13 17   Creatinine 0.61 - 1.24 mg/dL 0.85 0.80 1.07  Sodium 135 - 145 mmol/L 141 141 140  Potassium 3.5 - 5.1 mmol/L 4.4 4.0 4.1  Chloride 98 - 111 mmol/L 100 101 99  CO2 22 - 32 mmol/L 34(H) 33(H) 35(H)  Calcium 8.9 - 10.3 mg/dL 7.9(L) 8.0(L) 8.0(L)    CBC Latest Ref Rng & Units 06/26/2018 06/25/2018 06/24/2018  WBC 4.0 - 10.5 K/uL 6.4 7.6 6.1  Hemoglobin 13.0 - 17.0 g/dL 14.6 15.3 14.9  Hematocrit 39.0 - 52.0 % 55.8(H) 59.5(H) 54.5(H)  Platelets 150 - 400 K/uL 98(L) 111(L) 106(L)    CXR: Cardiomegaly with vascular congestion.  No overt edema  I have personally reviewed all chest radiographs reported above including CXRs and CT chest unless otherwise indicated  IMPRESSION:   Severe acute on  chronic hypercarbic respiratory failure Untreated Pickwickian syndrome  (OSA + Obesity hypoventilation syndrome) Mild pulmonary edema, improved HFpEF   PLAN:  Careful oxygen titration.  SPO2 goal 88-93%.  No higher, no lower Continue BiPAP with sleep and as needed during the day He should wear BiPAP even while napping Keep head of bed elevated at all times Avoid all sedative medications Will hold further loop diuresis for now as this can exacerbate tendency for hypercapnia Monitor in SDU through today   Merton Border, MD PCCM service Mobile [phone] Pager [phone] 06/26/2018 2:31 PM

## 2018-06-26 NOTE — Plan of Care (Signed)
Nutrition Education Note  RD consulted for nutrition education regarding CHF.  Met with patient at bedside. He was previously educated by our service when diagnosed with CHF in 2016. Patient reports he has also met with outpatient RDs regarding CHF. He reports he has difficulty following low sodium diet at home so he feels there are days he goes over his salt limit. Unable to report exactly what type of foods he typically eats so RD unable to provide specific feedback to patient on which foods he eats are high in salt. He reports he is not on a fluid restriction. Also not ordered here. Discussed not drinking excess fluid and generally keeping fluid intake <2 L/day. He also reports he does not have a digital scale at home and he does not weigh himself daily. Encouraged patient to weigh himself daily so he can identify fluid retention.  RD provided "Low Sodium Nutrition Therapy" handout from the Academy of [employer] Dietetics. Reviewed patient's dietary recall. Provided examples on ways to decrease sodium intake in diet. Discouraged intake of processed foods and use of salt shaker. Encouraged fresh fruits and vegetables as well as whole grain sources of carbohydrates to maximize fiber intake.   RD discussed why it is important for patient to adhere to diet recommendations, and emphasized the role of fluids, foods to avoid, and importance of weighing self daily. Teach back method used.  Expect poor to fair compliance.  Body mass index is 46.87 kg/m. Pt meets criteria for obesity class III based on current BMI.  Current diet order is heart healthy, patient is consuming approximately 75% of meals at this time. Labs and medications reviewed. No further nutrition interventions warranted at this time. RD contact information provided. If additional nutrition issues arise, please re-consult RD.   Willey Blade, MS, [place], LDN Office: [phone] Pager: [phone] After Hours/Weekend Pager:  [phone]

## 2018-06-26 NOTE — Progress Notes (Signed)
Patient refused to wear BiPAP overnight. SpO2 is 97% with 4 liters nasal cannula. Will continue to monitor.   Cameron Ali, RN

## 2018-06-26 NOTE — Progress Notes (Signed)
Gary [name] NAME: Gary [name]    MR#:  [id number]  DATE OF BIRTH:  05/15/1955  SUBJECTIVE:  CHIEF COMPLAINT:   Chief Complaint  Patient presents with  . Shortness of Breath   Shortness of breath improved.  No fevers.  No chest pain.  Updated family present at bedside  REVIEW OF SYSTEMS:  Review of Systems  Constitutional: Negative for chills and fever.  HENT: Negative for hearing loss and tinnitus.   Eyes: Negative for blurred vision and double vision.  Respiratory: Positive for shortness of breath. Negative for cough.   Cardiovascular: Negative for chest pain and orthopnea.  Gastrointestinal: Negative for heartburn and nausea.  Genitourinary: Negative for dysuria and urgency.  Musculoskeletal: Negative for myalgias.  Skin: Negative for itching and rash.  Neurological: Negative for dizziness and headaches.  Psychiatric/Behavioral: Negative for depression and substance abuse.    DRUG ALLERGIES:  No Known Allergies VITALS:  Blood pressure 115/74, pulse 80, temperature 98.4 F (36.9 C), temperature source Axillary, resp. rate (!) 25, height 6\' 3"  (1.905 m), weight (!) 170.1 kg, SpO2 93 %. PHYSICAL EXAMINATION:   Physical Exam  Constitutional: He is oriented to person, place, and time. He appears well-developed and well-nourished.  HENT:  Head: Normocephalic.  Right Ear: External ear normal.  Mouth/Throat: Oropharynx is clear and moist.  Eyes: Pupils are equal, round, and reactive to light. Conjunctivae and EOM are normal. Right eye exhibits no discharge.  Neck: Normal range of motion. Neck supple. No tracheal deviation present.  Cardiovascular: Normal rate, regular rhythm and normal heart sounds.  Respiratory: Effort normal. He has no wheezes. He has rales.  GI: Soft. Bowel sounds are normal. There is no abdominal tenderness.  Musculoskeletal: Normal range of motion.        General: Edema present.  Neurological: He  is alert and oriented to person, place, and time. No cranial nerve deficit.  Skin: Skin is warm. He is not diaphoretic. No erythema.  Psychiatric: He has a normal mood and affect. His behavior is normal.   LABORATORY PANEL:  Male CBC Recent Labs  Lab 06/26/18 0527  WBC 6.4  HGB 14.6  HCT 55.8*  PLT 98*   ------------------------------------------------------------------------------------------------------------------ Chemistries  Recent Labs  Lab 06/26/18 0527  NA 141  K 4.4  CL 100  CO2 34*  GLUCOSE 92  BUN 20  CREATININE 0.85  CALCIUM 7.9*   RADIOLOGY:  Dg Chest Port 1 View  Result Date: 06/26/2018 CLINICAL DATA:  Respiratory failure EXAM: PORTABLE CHEST 1 VIEW COMPARISON:  Two days ago FINDINGS: Cardiomegaly and vascular congestion. No Kerley lines, effusion, or pneumothorax. Aortic tortuosity. IMPRESSION: Unchanged cardiomegaly and vascular congestion. Electronically Signed   By: Monte Fantasia M.D.   On: 06/26/2018 07:27   ASSESSMENT AND PLAN:   1. Acute on chronic combined systolic and diastolic CHF (congestive heart failure) ([place]) - Placed on IV Solu-Medrol.  Had to be admitted to stepdown unit due to patient requiring BiPAP.   Already weaned off BiPAP and currently on oxygen via nasal cannula this morning.  Continue oxygen titration with O2 sat goal of 88 to 93%.   Avoid sedating medications.   Being managed by critical care physician.  Holding further loop diuretics today because this can exacerbate tendency for hypercapnia   2.  Untreated obstructive sleep apnea Patient to follow-up with primary care physician to arrange for outpatient sleep study to facilitate getting CPAP for home use post discharge  from the hospital   3.  Morbid obesity Encouraged lifestyle modifications including weight loss  4.  HTN (hypertension) -home dose antihypertensives  DVT prophylaxis; Lovenox    All the records are reviewed and case discussed with Care Management/Social  Worker. Management plans discussed with the patient, family and they are in agreement.  CODE STATUS: Full Code  TOTAL TIME TAKING CARE OF THIS PATIENT: 35 minutes.   More than 50% of the time was spent in counseling/coordination of care: YES  POSSIBLE D/C IN 2 DAYS, DEPENDING ON CLINICAL CONDITION.   Hawkins Seaman M.D on 06/26/2018 at 3:37 PM  Between 7am to 6pm - Pager - [phone]  After 6pm go to [email or web address] - Technical brewer [place] [facility]  Office  [phone]  CC: Primary care physician; Center, [facility]  Note: This dictation was prepared with Dragon dictation along with smaller phrase technology. Any transcriptional errors that result from this process are unintentional.

## 2018-06-26 NOTE — TOC Initial Note (Signed)
Transition of Care [facility]) - Initial/Assessment Note    Patient Details  Name: Gary [name] MRN: [id number] Date of Birth: August 13, 1955  Transition of Care [facility]) CM/SW Contact:    Shelbie Hutching, RN Phone Number: 06/26/2018, 3:07 PM  Clinical Narrative:                 Patient admitted for CHF exacerbation.  Patient was discharged from the hospital for the same diagnosis 3 weeks ago.  Patient looks well, sitting up in the chair, wife at the bedside.  Patient reports that he does not have any home health services set up.  RNCM spoke with patient at last admission and arranged for Heart Failure protocol with [facility].  Drue Novel with [facility] notified and will let RNCM know if the home health was ever set up.  Patient reports that he has oxygen at home but never uses it.  Medicare approved home health list provided to patient and he will decide on agency before he discharges.   Expected Discharge Plan: [place]     Patient Goals and CMS Choice   CMS Medicare.gov Compare Post Acute Care list provided to:: Patient Choice offered to / list presented to : Patient, Spouse  Expected Discharge Plan and Services Expected Discharge Plan: [place] Choice: [place] arrangements for the past 2 months: Apartment                          Prior Living Arrangements/Services Living arrangements for the past 2 months: Apartment Lives with:: Spouse   Do you feel safe going back to the place where you live?: Yes      Need for Family Participation in Patient Care: Yes (Comment) Care giver support system in place?: Yes (comment)      Activities of Daily Living Home Assistive Devices/Equipment: None ADL Screening (condition at time of admission) Patient's cognitive ability adequate to safely complete daily activities?: Yes Is the patient deaf or have difficulty hearing?: No Does the patient have difficulty seeing, even when  wearing glasses/contacts?: No Does the patient have difficulty concentrating, remembering, or making decisions?: Yes Patient able to express need for assistance with ADLs?: Yes Does the patient have difficulty dressing or bathing?: No Independently performs ADLs?: Yes (appropriate for developmental age) Does the patient have difficulty walking or climbing stairs?: No Weakness of Legs: Both Weakness of Arms/Hands: Both  Permission Sought/Granted                  Emotional Assessment Appearance:: Appears stated age Attitude/Demeanor/Rapport: Self-Absorbed Affect (typically observed): Accepting Orientation: : Oriented to Self, Oriented to Place, Oriented to  Time, Oriented to Situation Alcohol / Substance Use: Tobacco Use Psych Involvement: No (comment)  Admission diagnosis:  Acute pulmonary edema ([name]) [J81.0] Acute on chronic respiratory failure with hypoxia ([place]) [J96.21] Patient Active Problem List   Diagnosis Date Noted  . Acute on chronic combined systolic and diastolic CHF (congestive heart failure) ([place]) 05/28/2018  . Acute on chronic respiratory failure ([place]) 05/28/2018  . Tobacco use 05/04/2017  . Lymphedema 05/04/2017  . CHF (congestive heart failure) ([place]) 04/12/2017  . Chronic diastolic heart failure ([place]) 02/28/2015  . Sleep apnea 02/28/2015  . Acute on chronic respiratory failure with hypoxia ([place])   . Polysubstance abuse ([place])   . Morbid obesity ([place]) 04/01/2014  . HTN (hypertension) 04/01/2014  . Multiple rib fractures 03/27/2014  PCP:  Center, [place]:   [facility] [address], [place] - [place] [address] Phone: [phone] Fax: [phone]  [facility] [place], [place] [address] [place] [address] Phone: [phone] Fax: [phone]  [place], [place] [address] Phone: [phone] Fax: [phone]     Social Determinants of Health (SDOH) Interventions    Readmission Risk Interventions 30 Day Unplanned Readmission Risk Score     ED to Hosp-Admission (Current) from 06/24/2018 in [place] ICU/CCU  30 Day Unplanned Readmission Risk Score (%)  15 Filed at 06/26/2018 1200     This score is the patient's risk of an unplanned readmission within 30 days of being discharged (0 -100%). The score is based on dignosis, age, lab data, medications, orders, and past utilization.   Low:  0-14.9   Medium: 15-21.9   High: 22-29.9   Extreme: 30 and above       No flowsheet data found.

## 2018-06-27 LAB — BLOOD GAS, ARTERIAL
Acid-Base Excess: 14.4 mmol/L — ABNORMAL HIGH (ref 0.0–2.0)
Allens test (pass/fail): POSITIVE — AB
Bicarbonate: 43.4 mmol/L — ABNORMAL HIGH (ref 20.0–28.0)
FIO2: 28
O2 Saturation: 87.2 %
Patient temperature: 37
pCO2 arterial: 75 mmHg (ref 32.0–48.0)
pH, Arterial: 7.37 (ref 7.350–7.450)
pO2, Arterial: 55 mmHg — ABNORMAL LOW (ref 83.0–108.0)

## 2018-06-27 MED ORDER — SODIUM CHLORIDE 0.9% FLUSH
3.0000 mL | Freq: Two times a day (BID) | INTRAVENOUS | Status: DC
  Administered 2018-06-27 – 2018-06-28 (×3): 3 mL via INTRAVENOUS

## 2018-06-27 MED ORDER — SODIUM CHLORIDE 0.9% FLUSH: 3.0000 mL | INTRAVENOUS | Status: DC | PRN

## 2018-06-27 MED ORDER — FUROSEMIDE 10 MG/ML IJ SOLN
40.0000 mg | Freq: Every day | INTRAMUSCULAR | Status: DC
  Administered 2018-06-27 – 2018-06-28 (×2): 40 mg via INTRAVENOUS
  Filled 2018-06-27 (×2): qty 4

## 2018-06-27 NOTE — Evaluation (Signed)
Physical Therapy Evaluation Patient Details Name: Gary [name] MRN: [id number] DOB: [birth year] Today's Date: 06/27/2018   History of Present Illness  63 y.o. male with a known history of CHF, GI bleed, hypertension, hypoxia, morbid obesity, multiple gastric ulcers, sleep apnea - her last month with worsening shortness of breat, now here with similar issues and admitted with CHF.  Clinical Impression  Pt did well with mobility and ambulation.  He did have O2 requirement of 3L during ambulation with O2 slowly dropping from mid 90s to high 80s, but overall he appears to be near his baseline and he agrees that he needs further PT intervention.  Will complete PT orders, he is able to safely go home from PT perspective.      Follow Up Recommendations No PT follow up    Equipment Recommendations  None recommended by PT    Recommendations for Other Services       Precautions / Restrictions Precautions Precautions: Fall Restrictions Weight Bearing Restrictions: No      Mobility  Bed Mobility Overal bed mobility: Independent             General bed mobility comments: pt lounging half in/half out of bed on arrival - able to get to sitting w/o assist  Transfers Overall transfer level: Independent Equipment used: None             General transfer comment: Pt was able to rise to standing with good confidence and no safety isseus  Ambulation/Gait Ambulation/Gait assistance: Independent Gait Distance (Feet): 200 Feet Assistive device: None       General Gait Details: Pt with consistent cadence and no LOBs during circumambulation of the nurses' station.  His O2 stayed in the low 90s (into the high 80s at times) on 3 liters during ambulation.  He reported some fatigue, but was not overly tired on returning to the room.  Pt with minimal foot clearance, dragging sandals but appears to be his baseline.  Stairs            Wheelchair Mobility    Modified Rankin (Stroke  Patients Only)       Balance Overall balance assessment: Modified Independent                                           Pertinent Vitals/Pain Pain Assessment: (c/o vague lower abdominal pain during ambulation)    Home Living Family/patient expects to be discharged to:: Private residence Living Arrangements: Spouse/significant other Available Help at Discharge: Family   Home Access: Level entry     Home Layout: One level        Prior Function Level of Independence: Independent               Hand Dominance        Extremity/Trunk Assessment   Upper Extremity Assessment Upper Extremity Assessment: Overall WFL for tasks assessed    Lower Extremity Assessment Lower Extremity Assessment: Overall WFL for tasks assessed       Communication   Communication: No difficulties  Cognition Arousal/Alertness: Awake/alert Behavior During Therapy: Restless Overall Cognitive Status: Within Functional Limits for tasks assessed                                        General Comments  Exercises     Assessment/Plan    PT Assessment Patent does not need any further PT services  PT Problem List         PT Treatment Interventions      PT Goals (Current goals can be found in the Care Plan section)  Acute Rehab PT Goals Patient Stated Goal: go home PT Goal Formulation: All assessment and education complete, DC therapy    Frequency     Barriers to discharge        Co-evaluation               AM-PAC PT "6 Clicks" Mobility  Outcome Measure Help needed turning from your back to your side while in a flat bed without using bedrails?: None Help needed moving from lying on your back to sitting on the side of a flat bed without using bedrails?: None Help needed moving to and from a bed to a chair (including a wheelchair)?: None Help needed standing up from a chair using your arms (e.g., wheelchair or bedside chair)?:  None Help needed to walk in hospital room?: None Help needed climbing 3-5 steps with a railing? : None 6 Click Score: 24    End of Session Equipment Utilized During Treatment: Gait belt;Oxygen(3 L) Activity Tolerance: Patient tolerated treatment well Patient left: in bed;with nursing/sitter in room;with family/visitor present;with call bell/phone within reach Nurse Communication: Mobility status(O2 during ambulation) PT Visit Diagnosis: Muscle weakness (generalized) (M62.81);Difficulty in walking, not elsewhere classified (R26.2)    Time: 5027-7412 PT Time Calculation (min) (ACUTE ONLY): 22 min   Charges:   PT Evaluation $PT Eval Low Complexity: 1 Low          Kreg Shropshire, DPT 06/27/2018, 4:11 PM

## 2018-06-27 NOTE — TOC Progression Note (Signed)
Transition of Care [facility]) - Progression Note    Patient Details  Name: Gary [name] MRN: [id number] Date of Birth: February 05, 1956  Transition of Care [facility]) CM/SW Contact  Elza Rafter, RN Phone Number: 06/27/2018, 4:35 PM  Clinical Narrative:   Severe COPD and end stage emphysema; chronic respiratory failure with hypoxemia.  Patient continues to exhibit signs of hypercapnia associated with chronic respiratory failure secondary to severe COPD.  Patient requires the use of NIV both QHS and daytime to help with exacerbation periods.  The use of NIV will treat the patient's high PCO2 levels and can reduce risk of exacerbations and future hospitalizations when used at night and during the day.  Patient will need these advanced settings in conjunction with his current medication regimen; BIPAP with backup rate has been tried and failed.  Failure to have NIV available for use over a 24 hr period could lead to death.  Patient has been able to protect their airway and clear their own secretions.Geoffry Paradise, [facility]  [phone]      Expected Discharge Plan: [place]    Expected Discharge Plan and Services Expected Discharge Plan: [place]   Post Acute Care Choice: [place] arrangements for the past 2 months: Apartment                           Social Determinants of Health (SDOH) Interventions    Readmission Risk Interventions 30 Day Unplanned Readmission Risk Score     ED to Hosp-Admission (Current) from 06/24/2018 in [place] (2A)  30 Day Unplanned Readmission Risk Score (%)  16 Filed at 06/27/2018 1600     This score is the patient's risk of an unplanned readmission within 30 days of being discharged (0 -100%). The score is based on dignosis, age, lab data, medications, orders, and past utilization.   Low:  0-14.9   Medium: 15-21.9   High: 22-29.9   Extreme: 30 and above       No  flowsheet data found.

## 2018-06-27 NOTE — Progress Notes (Signed)
Pt refusing to wear BiPAP at this time. Pt is back on 3L [place]. Will continue to monitor.

## 2018-06-27 NOTE — Progress Notes (Signed)
[facility] [place] NAME: Travon Crochet    MR#:  [id number]  DATE OF BIRTH:  19-Mar-1956  SUBJECTIVE:  CHIEF COMPLAINT:   Chief Complaint  Patient presents with  . Shortness of Breath   Shortness of breath improved.  No fevers.  Patient transferred out of stepdown unit yesterday.  Sitting up at bedside.  Currently on oxygen via nasal cannula.  Complained of some mild headache this morning.  REVIEW OF SYSTEMS:  Review of Systems  Constitutional: Negative for chills and fever.  HENT: Negative for hearing loss and tinnitus.   Eyes: Negative for blurred vision and double vision.  Respiratory: Negative for cough and shortness of breath.        Shortness of breath improved  Cardiovascular: Negative for chest pain and orthopnea.  Gastrointestinal: Negative for heartburn and nausea.  Genitourinary: Negative for dysuria and urgency.  Musculoskeletal: Negative for myalgias.  Skin: Negative for itching and rash.  Neurological: Negative for dizziness and headaches.  Psychiatric/Behavioral: Negative for depression and substance abuse.    DRUG ALLERGIES:  No Known Allergies VITALS:  Blood pressure (!) 139/95, pulse 73, temperature 98.5 F (36.9 C), temperature source Oral, resp. rate 16, height 6\' 3"  (1.905 m), weight (!) 171.1 kg, SpO2 (!) 89 %. PHYSICAL EXAMINATION:   Physical Exam  Constitutional: He is oriented to person, place, and time. He appears well-developed and well-nourished.  HENT:  Head: Normocephalic.  Right Ear: External ear normal.  Mouth/Throat: Oropharynx is clear and moist.  Eyes: Pupils are equal, round, and reactive to light. Conjunctivae and EOM are normal. Right eye exhibits no discharge.  Neck: Normal range of motion. Neck supple. No tracheal deviation present.  Cardiovascular: Normal rate, regular rhythm and normal heart sounds.  Respiratory: Effort normal. He has no wheezes. He has rales.  GI: Soft. Bowel sounds  are normal. There is no abdominal tenderness.  Musculoskeletal: Normal range of motion.        General: Edema present.  Neurological: He is alert and oriented to person, place, and time. No cranial nerve deficit.  Skin: Skin is warm. He is not diaphoretic. No erythema.  Psychiatric: He has a normal mood and affect. His behavior is normal.   LABORATORY PANEL:  Male CBC Recent Labs  Lab 06/26/18 0527  WBC 6.4  HGB 14.6  HCT 55.8*  PLT 98*   ------------------------------------------------------------------------------------------------------------------ Chemistries  Recent Labs  Lab 06/26/18 0527  NA 141  K 4.4  CL 100  CO2 34*  GLUCOSE 92  BUN 20  CREATININE 0.85  CALCIUM 7.9*   RADIOLOGY:  No results found. ASSESSMENT AND PLAN:   1. Acute on chronic combined systolic and diastolic CHF (congestive heart failure) ([place]) - Placed on IV Lasix initially.  Had to be admitted to stepdown unit due to patient requiring BiPAP.   Already weaned off BiPAP and currently on oxygen via nasal cannula this morning.  Continue oxygen titration with O2 sat goal of 88 to 93%.   Avoid sedating medications.  Reviewed repeat ABG results this morning. Diurese gently with IV Lasix for 24 hours and transition to p.o. Lasix on discharge in a.m.  Continue Coreg and enalapril.  2.  Untreated obstructive sleep apnea Patient to follow-up with primary care physician to arrange for outpatient sleep study to facilitate getting CPAP for home use post discharge from the hospital   3.  Morbid obesity Encouraged lifestyle modifications including weight loss  4.  HTN (hypertension) -  home dose antihypertensives  DVT prophylaxis; Lovenox  Disposition; anticipate discharge home tomorrow with plans to transition to p.o. Lasix in a.m.  All the records are reviewed and case discussed with Care Management/Social Worker. Management plans discussed with the patient, family and they are in agreement.  CODE  STATUS: Full Code  TOTAL TIME TAKING CARE OF THIS PATIENT: 35 minutes.   More than 50% of the time was spent in counseling/coordination of care: Melina Copa M.D on 06/27/2018 at 12:37 PM  Between 7am to 6pm - Pager - [phone]  After 6pm go to [email or web address] - Technical brewer [place] [facility]  Office  [phone]  CC: Primary care physician; Center, [facility]  Note: This dictation was prepared with Dragon dictation along with smaller phrase technology. Any transcriptional errors that result from this process are unintentional.

## 2018-06-28 LAB — BASIC METABOLIC PANEL
Anion gap: 6 (ref 5–15)
BUN: 18 mg/dL (ref 8–23)
CO2: 34 mmol/L — ABNORMAL HIGH (ref 22–32)
Calcium: 8.2 mg/dL — ABNORMAL LOW (ref 8.9–10.3)
Chloride: 100 mmol/L (ref 98–111)
Creatinine, Ser: 0.87 mg/dL (ref 0.61–1.24)
GFR calc Af Amer: >60 mL/min (ref >60)
GFR calc non Af Amer: >60 mL/min (ref >60)
Glucose, Bld: 91 mg/dL (ref 70–99)
Potassium: 4 mmol/L (ref 3.5–5.1)
Sodium: 140 mmol/L (ref 135–145)

## 2018-06-28 LAB — CBC
HCT: 52.9 % — ABNORMAL HIGH (ref 39.0–52.0)
Hemoglobin: 14.3 g/dL (ref 13.0–17.0)
MCH: 22 pg — ABNORMAL LOW (ref 26.0–34.0)
MCHC: 27 g/dL — ABNORMAL LOW (ref 30.0–36.0)
MCV: 81.3 fL (ref 80.0–100.0)
Platelets: 94 10*3/uL — ABNORMAL LOW (ref 150–400)
RBC: 6.51 MIL/uL — ABNORMAL HIGH (ref 4.22–5.81)
RDW: 18.5 % — ABNORMAL HIGH (ref 11.5–15.5)
WBC: 6.2 10*3/uL (ref 4.0–10.5)
nRBC: 0.3 % — ABNORMAL HIGH (ref 0.0–0.2)

## 2018-06-28 LAB — MAGNESIUM: Magnesium: 2.2 mg/dL (ref 1.7–2.4)

## 2018-06-28 MED ORDER — ALBUTEROL SULFATE (2.5 MG/3ML) 0.083% IN NEBU: 2.5000 mg | INHALATION_SOLUTION | RESPIRATORY_TRACT | Status: DC | PRN

## 2018-06-28 MED ORDER — MODAFINIL 100 MG PO TABS: 100.0000 mg | ORAL_TABLET | Freq: Every day | ORAL | 0 refills | Status: DC

## 2018-06-28 NOTE — Progress Notes (Signed)
RN removed IV.  Patient will leave in a private vehicle with his wife.  Phillis Knack, RN
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
## 2018-07-03 ENCOUNTER — Telehealth: Payer: Self-pay | Admitting: Gastroenterology

## 2018-07-03 NOTE — Telephone Encounter (Signed)
Left message with pt sister for pt to call office. 9 pt needs to r/s his apt for 07/10/18 to 8 weeks out due to corona virus)

## 2018-07-04 ENCOUNTER — Ambulatory Visit: Payer: Medicaid Other | Admitting: Family

## 2018-07-04 ENCOUNTER — Telehealth: Payer: Self-pay | Admitting: Family

## 2018-07-04 NOTE — Telephone Encounter (Signed)
Patient did not show for his [facility] Failure [facility] appointment on 07/04/2018. Will attempt to reschedule.
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
## 2018-07-10 ENCOUNTER — Ambulatory Visit: Payer: Medicaid Other | Admitting: Gastroenterology

## 2018-07-17 ENCOUNTER — Other Ambulatory Visit: Payer: Self-pay | Admitting: Internal Medicine

## 2018-07-17 MED ORDER — MODAFINIL 100 MG PO TABS: 100.0000 mg | ORAL_TABLET | Freq: Every day | ORAL | 0 refills | Status: DC

## 2018-08-20 ENCOUNTER — Telehealth: Payer: Self-pay

## 2018-08-20 NOTE — Telephone Encounter (Signed)
   TELEPHONE CALL NOTE  This patient has been deemed a candidate for follow-up tele-health visit to limit community exposure during the Covid-19 pandemic. I spoke with the patient via phone to discuss instructions. The patient was advised to review the section on consent for treatment as well. The patient will receive a phone call 2-3 days prior to their E-Visit at which time consent will be verbally confirmed. A Virtual Office Visit appointment type has been scheduled for 08/20/2018 with [facility].  [name] L, [name] 08/20/2018 1:14 PM

## 2018-08-20 NOTE — Telephone Encounter (Signed)
TELEPHONE CALL NOTE  KYLEE NARDOZZI has been deemed a candidate for a follow-up tele-health visit to limit community exposure during the Covid-19 pandemic. I spoke with the patient via phone to ensure availability of phone/video source, confirm preferred email & phone number, discuss instructions and expectations, and review consent.   I reminded ADONAI SELSOR to be prepared with any vital sign and/or heart rhythm information that could potentially be obtained via home monitoring, at the time of his visit.  Finally, I reminded TONNY ISENSEE to expect an e-mail containing a link for their video-based visit approximately 15 minutes before his visit, or alternatively, a phone call at the time of his visit if his visit is planned to be a phone encounter.  Did the patient verbally consent to treatment as below? YES  Zabdiel Dripps L, [name] 08/20/2018 1:15 PM  CONSENT FOR TELE-HEALTH VISIT - PLEASE REVIEW  I hereby voluntarily request, consent and authorize [facility] and its employed or contracted physicians, physician assistants, nurse practitioners or other licensed health care professionals (the Practitioner), to provide me with telemedicine health care services (the "Services") as deemed necessary by the treating Practitioner. I acknowledge and consent to receive the Services by the Practitioner via telemedicine. I understand that the telemedicine visit will involve communicating with the Practitioner through telephonic communication technology and the disclosure of certain medical information by electronic transmission. I acknowledge that I have been given the opportunity to request an in-person assessment or other available alternative prior to the telemedicine visit and am voluntarily participating in the telemedicine visit.  I understand that I have the right to withhold or withdraw my consent to the use of telemedicine in the course of my care at any time, without affecting my right  to future care or treatment, and that the Practitioner or I may terminate the telemedicine visit at any time. I understand that I have the right to inspect all information obtained and/or recorded in the course of the telemedicine visit and may receive copies of available information for a reasonable fee.  I understand that some of the potential risks of receiving the Services via telemedicine include:  [name] Delay or interruption in medical evaluation due to technological equipment failure or disruption; . Information transmitted may not be sufficient (e.g. poor resolution of images) to allow for appropriate medical decision making by the Practitioner; and/or  . In rare instances, security protocols could fail, causing a breach of personal health information.  Furthermore, I acknowledge that it is my responsibility to provide information about my medical history, conditions and care that is complete and accurate to the best of my ability. I acknowledge that Practitioner's advice, recommendations, and/or decision may be based on factors not within their control, such as incomplete or inaccurate data provided by me or lack of visual representation. I understand that the practice of medicine is not an exact science and that Practitioner makes no warranties or guarantees regarding treatment outcomes. I acknowledge that I will receive a copy of this consent concurrently upon execution via email to the email address I last provided but may also request a printed copy by calling the office of [facility].    I understand that my insurance may be billed for this visit.   I have read or had this consent read to me. . I understand the contents of this consent, which adequately explains the benefits and risks of the Services being provided via telemedicine.  [name]  I have been provided ample opportunity to ask questions regarding this consent and the Services and have had my questions answered to my  satisfaction. . I give my informed consent for the services to be provided through the use of telemedicine in my medical care  By participating in this telemedicine visit I agree to the above.

## 2018-08-21 ENCOUNTER — Telehealth: Payer: Self-pay | Admitting: Family

## 2018-08-21 ENCOUNTER — Other Ambulatory Visit: Payer: Self-pay

## 2018-08-21 ENCOUNTER — Ambulatory Visit: Payer: Medicaid Other | Attending: Family | Admitting: Family

## 2018-08-21 NOTE — Telephone Encounter (Signed)
Called patient at 8:40am and again at 9:20am for his [facility] telemedicine appointment. Patient did not answer the phone either time and messages were left asking patient to call back.

## 2018-09-15 ENCOUNTER — Ambulatory Visit: Payer: Medicaid Other | Admitting: Family

## 2018-09-19 ENCOUNTER — Telehealth: Payer: Self-pay

## 2018-09-19 ENCOUNTER — Other Ambulatory Visit: Payer: Self-pay

## 2018-09-19 ENCOUNTER — Ambulatory Visit: Payer: Medicaid Other | Attending: Family | Admitting: Family

## 2018-09-19 DIAGNOSIS — Z720 Tobacco use: Secondary | ICD-10-CM

## 2018-09-19 DIAGNOSIS — I5032 Chronic diastolic (congestive) heart failure: Secondary | ICD-10-CM

## 2018-09-19 DIAGNOSIS — I10 Essential (primary) hypertension: Secondary | ICD-10-CM

## 2018-09-19 NOTE — Telephone Encounter (Signed)
   TELEPHONE CALL NOTE  This patient has been deemed a candidate for follow-up tele-health visit to limit community exposure during the Covid-19 pandemic. I spoke with the patient via phone to discuss instructions. The patient was advised to review the section on consent for treatment as well. The patient will receive a phone call 2-3 days prior to their E-Visit at which time consent will be verbally confirmed. A Virtual Office Visit appointment type has been scheduled for 09/19/2018 with Darylene Price FNP.  [name] L, [name] 09/19/2018 9:50 AM
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
## 2018-09-19 NOTE — Patient Instructions (Signed)
Resume weighing daily and call for an overnight weight gain of > 2 pounds or a weekly weight gain of >5 pounds. 

## 2018-09-19 NOTE — Telephone Encounter (Signed)
TELEPHONE CALL NOTE  Gary [name] has been deemed a candidate for a follow-up tele-health visit to limit community exposure during the Covid-19 pandemic. I spoke with the patient via phone to ensure availability of phone/video source, confirm preferred email & phone number, discuss instructions and expectations, and review consent.   I reminded Gary [name] to be prepared with any vital sign and/or heart rhythm information that could potentially be obtained via home monitoring, at the time of his visit.  Finally, I reminded Gary [name] to expect an e-mail containing a link for their video-based visit approximately 15 minutes before his visit, or alternatively, a phone call at the time of his visit if his visit is planned to be a phone encounter.  Did the patient verbally consent to treatment as below? Yes  Gary [name], [name] 09/19/2018 9:51 AM  CONSENT FOR TELE-HEALTH VISIT - PLEASE REVIEW  I hereby voluntarily request, consent and authorize [facility] and its employed or contracted physicians, physician assistants, nurse practitioners or other licensed health care professionals (the Practitioner), to provide me with telemedicine health care services (the "Services") as deemed necessary by the treating Practitioner. I acknowledge and consent to receive the Services by the Practitioner via telemedicine. I understand that the telemedicine visit will involve communicating with the Practitioner through telephonic communication technology and the disclosure of certain medical information by electronic transmission. I acknowledge that I have been given the opportunity to request an in-person assessment or other available alternative prior to the telemedicine visit and am voluntarily participating in the telemedicine visit.  I understand that I have the right to withhold or withdraw my consent to the use of telemedicine in the course of my care at any time, without affecting my right  to future care or treatment, and that the Practitioner or I may terminate the telemedicine visit at any time. I understand that I have the right to inspect all information obtained and/or recorded in the course of the telemedicine visit and may receive copies of available information for a reasonable fee.  I understand that some of the potential risks of receiving the Services via telemedicine include:  Gary [name] Delay or interruption in medical evaluation due to technological equipment failure or disruption; . Information transmitted may not be sufficient (e.g. poor resolution of images) to allow for appropriate medical decision making by the Practitioner; and/or  . In rare instances, security protocols could fail, causing a breach of personal health information.  Furthermore, I acknowledge that it is my responsibility to provide information about my medical history, conditions and care that is complete and accurate to the best of my ability. I acknowledge that Practitioner's advice, recommendations, and/or decision may be based on factors not within their control, such as incomplete or inaccurate data provided by me or lack of visual representation. I understand that the practice of medicine is not an exact science and that Practitioner makes no warranties or guarantees regarding treatment outcomes. I acknowledge that I will receive a copy of this consent concurrently upon execution via email to the email address I last provided but may also request a printed copy by calling the office of [facility].    I understand that my insurance may be billed for this visit.   I have read or had this consent read to me. . I understand the contents of this consent, which adequately explains the benefits and risks of the Services being provided via telemedicine.  Gary [name]  I have been provided ample opportunity to ask questions regarding this consent and the Services and have had my questions answered to my  satisfaction. . I give my informed consent for the services to be provided through the use of telemedicine in my medical care  By participating in this telemedicine visit I agree to the above.

## 2018-10-13 ENCOUNTER — Other Ambulatory Visit: Payer: Self-pay

## 2018-10-13 ENCOUNTER — Inpatient Hospital Stay
Admission: EM | Admit: 2018-10-13 | Discharge: 2018-10-22 | DRG: 291 | Disposition: A | Discharge status: Skilled Nursing Facility | Payer: Medicaid Other | Attending: Internal Medicine | Admitting: Internal Medicine

## 2018-10-13 ENCOUNTER — Emergency Department: Payer: Medicaid Other

## 2018-10-13 DIAGNOSIS — F17210 Nicotine dependence, cigarettes, uncomplicated: Secondary | ICD-10-CM | POA: Diagnosis present

## 2018-10-13 DIAGNOSIS — N179 Acute kidney failure, unspecified: Secondary | ICD-10-CM | POA: Diagnosis present

## 2018-10-13 DIAGNOSIS — E662 Morbid (severe) obesity with alveolar hypoventilation: Secondary | ICD-10-CM | POA: Diagnosis present

## 2018-10-13 DIAGNOSIS — Z79899 Other long term (current) drug therapy: Secondary | ICD-10-CM | POA: Diagnosis not present

## 2018-10-13 DIAGNOSIS — Z1159 Encounter for screening for other viral diseases: Secondary | ICD-10-CM | POA: Diagnosis not present

## 2018-10-13 DIAGNOSIS — G4733 Obstructive sleep apnea (adult) (pediatric): Secondary | ICD-10-CM | POA: Diagnosis present

## 2018-10-13 DIAGNOSIS — I5033 Acute on chronic diastolic (congestive) heart failure: Secondary | ICD-10-CM | POA: Diagnosis present

## 2018-10-13 DIAGNOSIS — Z7982 Long term (current) use of aspirin: Secondary | ICD-10-CM

## 2018-10-13 DIAGNOSIS — Z6841 Body Mass Index (BMI) 40.0 and over, adult: Secondary | ICD-10-CM

## 2018-10-13 DIAGNOSIS — J9622 Acute and chronic respiratory failure with hypercapnia: Secondary | ICD-10-CM | POA: Diagnosis present

## 2018-10-13 DIAGNOSIS — J9602 Acute respiratory failure with hypercapnia: Secondary | ICD-10-CM | POA: Diagnosis present

## 2018-10-13 DIAGNOSIS — Z01818 Encounter for other preprocedural examination: Secondary | ICD-10-CM

## 2018-10-13 DIAGNOSIS — I959 Hypotension, unspecified: Secondary | ICD-10-CM | POA: Diagnosis present

## 2018-10-13 DIAGNOSIS — J9621 Acute and chronic respiratory failure with hypoxia: Secondary | ICD-10-CM | POA: Diagnosis present

## 2018-10-13 DIAGNOSIS — D696 Thrombocytopenia, unspecified: Secondary | ICD-10-CM | POA: Diagnosis present

## 2018-10-13 DIAGNOSIS — I10 Essential (primary) hypertension: Secondary | ICD-10-CM | POA: Diagnosis present

## 2018-10-13 DIAGNOSIS — G4730 Sleep apnea, unspecified: Secondary | ICD-10-CM | POA: Diagnosis present

## 2018-10-13 DIAGNOSIS — I110 Hypertensive heart disease with heart failure: Principal | ICD-10-CM | POA: Diagnosis present

## 2018-10-13 DIAGNOSIS — J9600 Acute respiratory failure, unspecified whether with hypoxia or hypercapnia: Secondary | ICD-10-CM

## 2018-10-13 DIAGNOSIS — R0602 Shortness of breath: Secondary | ICD-10-CM | POA: Diagnosis present

## 2018-10-13 DIAGNOSIS — G9340 Encephalopathy, unspecified: Secondary | ICD-10-CM | POA: Diagnosis present

## 2018-10-13 DIAGNOSIS — I509 Heart failure, unspecified: Secondary | ICD-10-CM

## 2018-10-13 DIAGNOSIS — I5043 Acute on chronic combined systolic (congestive) and diastolic (congestive) heart failure: Secondary | ICD-10-CM | POA: Diagnosis present

## 2018-10-13 DIAGNOSIS — Z4659 Encounter for fitting and adjustment of other gastrointestinal appliance and device: Secondary | ICD-10-CM

## 2018-10-13 LAB — CBC WITH DIFFERENTIAL/PLATELET
Abs Immature Granulocytes: 0.03 10*3/uL (ref 0.00–0.07)
Basophils Absolute: 0 10*3/uL (ref 0.0–0.1)
Basophils Relative: 0 %
Eosinophils Absolute: 0 10*3/uL (ref 0.0–0.5)
Eosinophils Relative: 0 %
HCT: 52.6 % — ABNORMAL HIGH (ref 39.0–52.0)
Hemoglobin: 13.7 g/dL (ref 13.0–17.0)
Immature Granulocytes: 0 %
Lymphocytes Relative: 19 %
Lymphs Abs: 1.3 10*3/uL (ref 0.7–4.0)
MCH: 21.4 pg — ABNORMAL LOW (ref 26.0–34.0)
MCHC: 26 g/dL — ABNORMAL LOW (ref 30.0–36.0)
MCV: 82.3 fL (ref 80.0–100.0)
Monocytes Absolute: 1.1 10*3/uL — ABNORMAL HIGH (ref 0.1–1.0)
Monocytes Relative: 16 %
Neutro Abs: 4.4 10*3/uL (ref 1.7–7.7)
Neutrophils Relative %: 65 %
Platelets: 129 10*3/uL — ABNORMAL LOW (ref 150–400)
RBC: 6.39 MIL/uL — ABNORMAL HIGH (ref 4.22–5.81)
RDW: 25.4 % — ABNORMAL HIGH (ref 11.5–15.5)
WBC: 6.8 10*3/uL (ref 4.0–10.5)
nRBC: 6.8 % — ABNORMAL HIGH (ref 0.0–0.2)

## 2018-10-13 LAB — BASIC METABOLIC PANEL
Anion gap: 7 (ref 5–15)
BUN: 47 mg/dL — ABNORMAL HIGH (ref 8–23)
CO2: 32 mmol/L (ref 22–32)
Calcium: 7.4 mg/dL — ABNORMAL LOW (ref 8.9–10.3)
Chloride: 98 mmol/L (ref 98–111)
Creatinine, Ser: 2.05 mg/dL — ABNORMAL HIGH (ref 0.61–1.24)
GFR calc Af Amer: 39 mL/min — ABNORMAL LOW (ref >60)
GFR calc non Af Amer: 34 mL/min — ABNORMAL LOW (ref >60)
Glucose, Bld: 89 mg/dL (ref 70–99)
Potassium: 4.8 mmol/L (ref 3.5–5.1)
Sodium: 137 mmol/L (ref 135–145)

## 2018-10-13 LAB — SARS CORONAVIRUS 2 BY RT PCR (HOSPITAL ORDER, PERFORMED IN ~~LOC~~ HOSPITAL LAB): SARS Coronavirus 2: NEGATIVE

## 2018-10-13 LAB — TROPONIN I (HIGH SENSITIVITY): Troponin I (High Sensitivity): 22 ng/L — ABNORMAL HIGH (ref <18)

## 2018-10-13 LAB — BRAIN NATRIURETIC PEPTIDE: B Natriuretic Peptide: 1786 pg/mL — ABNORMAL HIGH (ref 0.0–100.0)

## 2018-10-13 MED ORDER — FUROSEMIDE 10 MG/ML IJ SOLN
INTRAMUSCULAR | Status: AC
  Filled 2018-10-13: qty 4

## 2018-10-13 MED ORDER — FUROSEMIDE 10 MG/ML IJ SOLN
80.0000 mg | Freq: Once | INTRAMUSCULAR | Status: AC
  Administered 2018-10-13: 80 mg via INTRAVENOUS

## 2018-10-13 NOTE — ED Notes (Signed)
Pt sleeping.  nsr on monitor.  

## 2018-10-13 NOTE — ED Notes (Signed)
primedoc in with pt now 
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
## 2018-10-13 NOTE — ED Notes (Signed)
Pt sleeping  Oxygen in place.  nsr on monitor.  Iv in place.  siderails up x 2.
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
## 2018-10-13 NOTE — ED Triage Notes (Addendum)
Patient from home by [facility]. Per EMS patient was having SOB with intial being 62% was placed on non rebreather at 100% O2 WDL. Per EMS have been called out to home multiple times today. Patient was having some confusion and dark colored urine. Edema bilaterally with weeping in left lower leg.

## 2018-10-14 ENCOUNTER — Inpatient Hospital Stay: Payer: Medicaid Other

## 2018-10-14 DIAGNOSIS — J9602 Acute respiratory failure with hypercapnia: Secondary | ICD-10-CM | POA: Diagnosis present

## 2018-10-14 LAB — BASIC METABOLIC PANEL
Anion gap: 9 (ref 5–15)
Anion gap: 14 (ref 5–15)
BUN: 48 mg/dL — ABNORMAL HIGH (ref 8–23)
BUN: 46 mg/dL — ABNORMAL HIGH (ref 8–23)
CO2: 32 mmol/L (ref 22–32)
CO2: 30 mmol/L (ref 22–32)
Calcium: 7.5 mg/dL — ABNORMAL LOW (ref 8.9–10.3)
Calcium: 7.7 mg/dL — ABNORMAL LOW (ref 8.9–10.3)
Chloride: 98 mmol/L (ref 98–111)
Chloride: 97 mmol/L — ABNORMAL LOW (ref 98–111)
Creatinine, Ser: 2.23 mg/dL — ABNORMAL HIGH (ref 0.61–1.24)
Creatinine, Ser: 2.06 mg/dL — ABNORMAL HIGH (ref 0.61–1.24)
GFR calc Af Amer: 36 mL/min — ABNORMAL LOW (ref >60)
GFR calc Af Amer: 39 mL/min — ABNORMAL LOW (ref >60)
GFR calc non Af Amer: 31 mL/min — ABNORMAL LOW (ref >60)
GFR calc non Af Amer: 34 mL/min — ABNORMAL LOW (ref >60)
Glucose, Bld: 105 mg/dL — ABNORMAL HIGH (ref 70–99)
Glucose, Bld: 71 mg/dL (ref 70–99)
Potassium: 4.4 mmol/L (ref 3.5–5.1)
Potassium: 3.7 mmol/L (ref 3.5–5.1)
Sodium: 139 mmol/L (ref 135–145)
Sodium: 141 mmol/L (ref 135–145)

## 2018-10-14 LAB — BLOOD GAS, ARTERIAL
Acid-Base Excess: 2.8 mmol/L — ABNORMAL HIGH (ref 0.0–2.0)
Acid-Base Excess: 4.9 mmol/L — ABNORMAL HIGH (ref 0.0–2.0)
Bicarbonate: 32.7 mmol/L — ABNORMAL HIGH (ref 20.0–28.0)
Bicarbonate: 35.6 mmol/L — ABNORMAL HIGH (ref 20.0–28.0)
FIO2: 0.34
FIO2: 0.4
MECHVT: 550 mL
O2 Saturation: 83.7 %
O2 Saturation: 86.8 %
PEEP: 5 cmH2O
Patient temperature: 37
Patient temperature: 37
RATE: 26 resp/min
pCO2 arterial: 107 mmHg (ref 32.0–48.0)
pCO2 arterial: 62 mmHg — ABNORMAL HIGH (ref 32.0–48.0)
pH, Arterial: 7.13 — CL (ref 7.350–7.450)
pH, Arterial: 7.33 — ABNORMAL LOW (ref 7.350–7.450)
pO2, Arterial: 52 mmHg — ABNORMAL LOW (ref 83.0–108.0)
pO2, Arterial: 69 mmHg — ABNORMAL LOW (ref 83.0–108.0)

## 2018-10-14 LAB — CBC
HCT: 54 % — ABNORMAL HIGH (ref 39.0–52.0)
Hemoglobin: 13.6 g/dL (ref 13.0–17.0)
MCH: 21.1 pg — ABNORMAL LOW (ref 26.0–34.0)
MCHC: 25.2 g/dL — ABNORMAL LOW (ref 30.0–36.0)
MCV: 83.9 fL (ref 80.0–100.0)
Platelets: 114 10*3/uL — ABNORMAL LOW (ref 150–400)
RBC: 6.44 MIL/uL — ABNORMAL HIGH (ref 4.22–5.81)
RDW: 25.2 % — ABNORMAL HIGH (ref 11.5–15.5)
WBC: 5.9 10*3/uL (ref 4.0–10.5)
nRBC: 7.8 % — ABNORMAL HIGH (ref 0.0–0.2)

## 2018-10-14 LAB — URINE DRUG SCREEN, QUALITATIVE (ARMC ONLY)
Amphetamines, Ur Screen: NOT DETECTED
Barbiturates, Ur Screen: NOT DETECTED
Benzodiazepine, Ur Scrn: NOT DETECTED
Cannabinoid 50 Ng, Ur ~~LOC~~: NOT DETECTED
Cocaine Metabolite,Ur ~~LOC~~: NOT DETECTED
MDMA (Ecstasy)Ur Screen: NOT DETECTED
Methadone Scn, Ur: NOT DETECTED
Opiate, Ur Screen: NOT DETECTED
Phencyclidine (PCP) Ur S: NOT DETECTED
Tricyclic, Ur Screen: NOT DETECTED

## 2018-10-14 LAB — TROPONIN I (HIGH SENSITIVITY): Troponin I (High Sensitivity): 23 ng/L — ABNORMAL HIGH (ref <18)

## 2018-10-14 LAB — MRSA PCR SCREENING: MRSA by PCR: NEGATIVE

## 2018-10-14 LAB — TRIGLYCERIDES: Triglycerides: 153 mg/dL — ABNORMAL HIGH (ref <150)

## 2018-10-14 LAB — GLUCOSE, CAPILLARY: Glucose-Capillary: 102 mg/dL — ABNORMAL HIGH (ref 70–99)

## 2018-10-14 LAB — PATHOLOGIST SMEAR REVIEW

## 2018-10-14 LAB — ETHANOL: Alcohol, Ethyl (B): <10 mg/dL (ref <10)

## 2018-10-14 MED ORDER — ACETAMINOPHEN 650 MG RE SUPP: 650.0000 mg | Freq: Four times a day (QID) | RECTAL | Status: DC | PRN

## 2018-10-14 MED ORDER — NOREPINEPHRINE 4 MG/250ML-% IV SOLN
INTRAVENOUS | Status: AC
  Filled 2018-10-14: qty 250

## 2018-10-14 MED ORDER — FENTANYL CITRATE (PF) 100 MCG/2ML IJ SOLN
INTRAMUSCULAR | Status: AC
  Filled 2018-10-14: qty 2

## 2018-10-14 MED ORDER — FENTANYL CITRATE (PF) 100 MCG/2ML IJ SOLN
100.0000 ug | Freq: Once | INTRAMUSCULAR | Status: DC
  Filled 2018-10-14: qty 2

## 2018-10-14 MED ORDER — SIMVASTATIN 20 MG PO TABS
10.0000 mg | ORAL_TABLET | Freq: Every day | ORAL | Status: DC
  Administered 2018-10-14 – 2018-10-17 (×4): 10 mg
  Filled 2018-10-14 (×4): qty 1

## 2018-10-14 MED ORDER — FENTANYL CITRATE (PF) 100 MCG/2ML IJ SOLN
50.0000 ug | INTRAMUSCULAR | Status: DC | PRN
  Administered 2018-10-14 (×2): 100 ug via INTRAVENOUS
  Administered 2018-10-15 (×2): 200 ug via INTRAVENOUS
  Administered 2018-10-15: 100 ug via INTRAVENOUS
  Administered 2018-10-16 (×2): 50 ug via INTRAVENOUS
  Administered 2018-10-16: 100 ug via INTRAVENOUS
  Filled 2018-10-14 (×2): qty 4
  Filled 2018-10-14: qty 2

## 2018-10-14 MED ORDER — SODIUM CHLORIDE 0.9% FLUSH: 10.0000 mL | INTRAVENOUS | Status: DC | PRN

## 2018-10-14 MED ORDER — FAMOTIDINE IN NACL 20-0.9 MG/50ML-% IV SOLN: 20.0000 mg | Freq: Two times a day (BID) | INTRAVENOUS | Status: DC

## 2018-10-14 MED ORDER — SIMVASTATIN 20 MG PO TABS: 10.0000 mg | ORAL_TABLET | Freq: Every day | ORAL | Status: DC

## 2018-10-14 MED ORDER — ROCURONIUM BROMIDE 50 MG/5ML IV SOLN
50.0000 mg | Freq: Once | INTRAVENOUS | Status: AC
  Administered 2018-10-14: 03:00:00 50 mg via INTRAVENOUS

## 2018-10-14 MED ORDER — LISINOPRIL 20 MG PO TABS: 20.0000 mg | ORAL_TABLET | Freq: Every day | ORAL | Status: DC

## 2018-10-14 MED ORDER — FUROSEMIDE 20 MG PO TABS: 80.0000 mg | ORAL_TABLET | Freq: Two times a day (BID) | ORAL | Status: DC

## 2018-10-14 MED ORDER — ASPIRIN 81 MG PO CHEW
81.0000 mg | CHEWABLE_TABLET | Freq: Every day | ORAL | Status: DC
  Administered 2018-10-14 – 2018-10-17 (×4): 81 mg
  Filled 2018-10-14 (×4): qty 1

## 2018-10-14 MED ORDER — FUROSEMIDE 10 MG/ML IJ SOLN
40.0000 mg | Freq: Once | INTRAMUSCULAR | Status: AC
  Administered 2018-10-14: 40 mg via INTRAVENOUS
  Filled 2018-10-14 (×2): qty 4

## 2018-10-14 MED ORDER — SODIUM CHLORIDE 0.9% FLUSH
10.0000 mL | Freq: Two times a day (BID) | INTRAVENOUS | Status: DC
  Administered 2018-10-14: 20 mL
  Administered 2018-10-15 – 2018-10-17 (×5): 10 mL
  Administered 2018-10-18: 30 mL
  Administered 2018-10-18: 10 mL
  Administered 2018-10-19: 30 mL
  Administered 2018-10-19 – 2018-10-21 (×5): 10 mL

## 2018-10-14 MED ORDER — ROCURONIUM BROMIDE 50 MG/5ML IV SOLN
INTRAVENOUS | Status: AC
  Administered 2018-10-14: 02:00:00
  Filled 2018-10-14: qty 1

## 2018-10-14 MED ORDER — FAMOTIDINE IN NACL 20-0.9 MG/50ML-% IV SOLN: 20.0000 mg | INTRAVENOUS | Status: DC

## 2018-10-14 MED ORDER — ONDANSETRON HCL 4 MG PO TABS: 4.0000 mg | ORAL_TABLET | Freq: Four times a day (QID) | ORAL | Status: DC | PRN

## 2018-10-14 MED ORDER — FUROSEMIDE 10 MG/ML IJ SOLN
40.0000 mg | Freq: Two times a day (BID) | INTRAMUSCULAR | Status: DC
  Administered 2018-10-14 – 2018-10-21 (×14): 40 mg via INTRAVENOUS
  Filled 2018-10-14 (×14): qty 4

## 2018-10-14 MED ORDER — PROPOFOL 1000 MG/100ML IV EMUL
0.0000 ug/kg/min | INTRAVENOUS | Status: DC
  Administered 2018-10-14: 40 ug/kg/min via INTRAVENOUS
  Administered 2018-10-14 (×2): 50 ug/kg/min via INTRAVENOUS
  Administered 2018-10-14: 04:00:00 40 ug/kg/min via INTRAVENOUS
  Administered 2018-10-14: 10:00:00 42 ug/kg/min via INTRAVENOUS
  Administered 2018-10-14: 07:00:00 40 ug/kg/min via INTRAVENOUS
  Administered 2018-10-14 – 2018-10-15 (×8): 50 ug/kg/min via INTRAVENOUS
  Administered 2018-10-15: 49.982 ug/kg/min via INTRAVENOUS
  Administered 2018-10-15: 50 ug/kg/min via INTRAVENOUS
  Filled 2018-10-14 (×16): qty 100

## 2018-10-14 MED ORDER — ETOMIDATE 2 MG/ML IV SOLN
INTRAVENOUS | Status: AC
  Filled 2018-10-14: qty 10

## 2018-10-14 MED ORDER — HEPARIN SODIUM (PORCINE) 5000 UNIT/ML IJ SOLN
5000.0000 [IU] | Freq: Three times a day (TID) | INTRAMUSCULAR | Status: DC
  Administered 2018-10-14 – 2018-10-22 (×26): 5000 [IU] via SUBCUTANEOUS
  Filled 2018-10-14 (×25): qty 1

## 2018-10-14 MED ORDER — ETOMIDATE 2 MG/ML IV SOLN
20.0000 mg | Freq: Once | INTRAVENOUS | Status: AC
  Administered 2018-10-14: 02:00:00 20 mg via INTRAVENOUS

## 2018-10-14 MED ORDER — ORAL CARE MOUTH RINSE
15.0000 mL | OROMUCOSAL | Status: DC
  Administered 2018-10-14 – 2018-10-18 (×40): 15 mL via OROMUCOSAL

## 2018-10-14 MED ORDER — ASPIRIN 81 MG PO CHEW: 81.0000 mg | CHEWABLE_TABLET | Freq: Every day | ORAL | Status: DC

## 2018-10-14 MED ORDER — ONDANSETRON HCL 4 MG/2ML IJ SOLN: 4.0000 mg | Freq: Four times a day (QID) | INTRAMUSCULAR | Status: DC | PRN

## 2018-10-14 MED ORDER — MIDAZOLAM HCL 2 MG/2ML IJ SOLN
2.0000 mg | Freq: Once | INTRAMUSCULAR | Status: AC
  Administered 2018-10-14: 2 mg via INTRAVENOUS

## 2018-10-14 MED ORDER — ACETAMINOPHEN 325 MG PO TABS
650.0000 mg | ORAL_TABLET | Freq: Four times a day (QID) | ORAL | Status: DC | PRN
  Administered 2018-10-17: 650 mg via ORAL
  Filled 2018-10-14: qty 2

## 2018-10-14 MED ORDER — ALBUMIN HUMAN 25 % IV SOLN
12.5000 g | Freq: Once | INTRAVENOUS | Status: AC
  Administered 2018-10-14: 12.5 g via INTRAVENOUS
  Filled 2018-10-14: qty 50

## 2018-10-14 MED ORDER — CARVEDILOL 6.25 MG PO TABS: 6.2500 mg | ORAL_TABLET | Freq: Two times a day (BID) | ORAL | Status: DC

## 2018-10-14 MED ORDER — MIDAZOLAM HCL 2 MG/2ML IJ SOLN
INTRAMUSCULAR | Status: AC
  Administered 2018-10-14: 03:00:00
  Filled 2018-10-14: qty 2

## 2018-10-14 MED ORDER — CHLORHEXIDINE GLUCONATE 0.12% ORAL RINSE (MEDLINE KIT)
15.0000 mL | Freq: Two times a day (BID) | OROMUCOSAL | Status: DC
  Administered 2018-10-14 – 2018-10-17 (×8): 15 mL via OROMUCOSAL

## 2018-10-14 MED ORDER — FENTANYL CITRATE (PF) 100 MCG/2ML IJ SOLN
50.0000 ug | INTRAMUSCULAR | Status: DC | PRN
  Administered 2018-10-15: 50 ug via INTRAVENOUS
  Filled 2018-10-14 (×2): qty 2

## 2018-10-14 MED ORDER — FAMOTIDINE 20 MG PO TABS
20.0000 mg | ORAL_TABLET | Freq: Two times a day (BID) | ORAL | Status: DC
  Administered 2018-10-14 – 2018-10-17 (×7): 20 mg
  Filled 2018-10-14 (×7): qty 1
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
## 2018-10-14 NOTE — Procedures (Signed)
Endotracheal Intubation Procedure Note  Indication for endotracheal intubation: respiratory failure. Airway Assessment: Mallampati Class: III (soft and hard palate and base of uvula visible). Sedation: etomidate and fentanyl. Paralytic: rocuronium. Lidocaine: no. Atropine: no. Equipment: glidescope utilized . Cricoid Pressure: yes. Number of attempts: 3. ETT location confirmed by by auscultation and ETCO2 monitor.  Pt required emergent intubation due to worsening respiratory failure and acute  encephalopathy on Bipap.  CXR pending.  [name], [place] Pager [phone] (please enter 7 digits) PCCM Consult Pager [phone] (please enter 7 digits)
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
## 2018-10-14 NOTE — ED Notes (Signed)
Report called to tammy rn icu nurse
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
## 2018-10-14 NOTE — Progress Notes (Signed)
Pharmacy Electrolyte Monitoring Consult:  Pharmacy consulted to assist in monitoring and replacing electrolytes in this 63 y.o. male admitted on 10/13/2018. Patient admitted with acute respiratory failure and CHF exacerbation. Patient is requiring mechanical ventilation.    Labs:  Sodium (mmol/L)  Date Value  10/14/2018 139  04/15/2014 140   Potassium (mmol/L)  Date Value  10/14/2018 4.4  04/15/2014 3.9   Magnesium (mg/dL)  Date Value  06/28/2018 2.2  08/21/2012 1.9   Phosphorus (mg/dL)  Date Value  04/13/2017 3.7   Calcium (mg/dL)  Date Value  10/14/2018 7.5 (L)   Calcium, Total (mg/dL)  Date Value  04/15/2014 8.3 (L)   Albumin (g/dL)  Date Value  04/12/2017 3.1 (L)  04/15/2014 2.9 (L)    Assessment/Plan: Patient receiving furosemide 40mg  IV Q12hr.   No replacement warranted at this time. Will need to monitor creatinine daily.   Will replace for goal potassium ~4 and goal magnesium ~ 2.   Will initiate Senna/Docusate 2 tabs BID.   Pharmacy will continue to monitor and adjust per consult.   Nyeshia Mysliwiec L 10/14/2018 4:52 PM

## 2018-10-14 NOTE — Procedures (Signed)
Central Venous Catheter Insertion Procedure Note Gary [name] [id number] [birth year]  Procedure: Insertion of Central Venous Catheter Indications: Assessment of intravascular volume, Drug and/or fluid administration and Frequent blood sampling  Procedure Details Consent: Unable to obtain consent because of emergent medical necessity. Time Out: Verified patient identification, verified procedure, site/side was marked, verified correct patient position, special equipment/implants available, medications/allergies/relevent history reviewed, required imaging and test results available.  Performed  Maximum sterile technique was used including antiseptics, cap, gloves, gown, hand hygiene, mask and sheet. Skin prep: Chlorhexidine; local anesthetic administered A antimicrobial bonded/coated triple lumen catheter was placed in the left femoral vein due to multiple attempts, no other available access using the Seldinger technique.  Evaluation Blood flow good Complications: No apparent complications Patient did tolerate procedure well. Chest X-ray ordered to verify placement.  CXR: pending.  Attempted to place left internal jugular CVL utilizing ultrasound, however unable to thread guidewire.  Therefore, left femoral CVL placed utilizing ultrasound no complications noted during or following procedure.  [name], [place] Pager [phone] (please enter 7 digits) PCCM Consult Pager [phone] (please enter 7 digits)

## 2018-10-14 NOTE — ED Notes (Signed)
Pt placed on bipap by rt.  Pt sleeping, aroused with eyes open.  No verbal response.  nsr on monitor.  Skin warm and dry.  Iv in place.  Pt waiting on admission.

## 2018-10-14 NOTE — Progress Notes (Signed)
Contacted pts sister [name] via telephone regarding decline in pts respiratory status requiring mechanical intubation and emergent central line placement due to limited iv access.  All questions were answered.   [name], [place] Pager [phone] (please enter 7 digits) PCCM Consult Pager [phone] (please enter 7 digits)

## 2018-10-14 NOTE — Progress Notes (Signed)
eLink Physician-Brief Progress Note Patient Name: Gary [name] DOB: October 10, 1955 MRN: [id number]   Date of Service  10/14/2018  HPI/Events of Note  63 yr old male, Morbid obesity, CHF, preserved EF, smoker, HTN admitted for 1) CHF exacerbation, received lasix, later somnolent with pco2 107. Placed on BiPAP from ED and accepted to ICU.  2) AKI. 3). Thrombocytopenia. Wbc normal. 40 OSA  Camera: On BiPAP. BMI > 40. Bed side team adjusting BiPAP.  Data: Reviewed. EKG: qtc prolonged, non specific changes. CxR Chf. Elevated BNP. Troponin 22. PH 7.13. Plt 127. Cr 2 ( baseline normal in 06/2018).   eICU Interventions  - follow ABG, low thresh hold for Intubation if not better. If not improving consider CT head - aspiration precautions - follow UOP. If not better, consider renalsono. - CBG goal < 180 - follow troponin and plt count. - SCD ordered as VTE for now.  - follow urine drug screen      Intervention Category Major Interventions: Respiratory failure - evaluation and management Evaluation Type: New Patient Evaluation  Elmer Sow 10/14/2018, 1:54 AM

## 2018-10-14 NOTE — Progress Notes (Signed)
[facility] at [place] NAME: Gary [name]    MR#:  [id number]  DATE OF BIRTH:  04-Sep-1955  SUBJECTIVE:  CHIEF COMPLAINT:   Chief Complaint  Patient presents with  . Shortness of Breath   Patient currently sedated on the vent.  REVIEW OF SYSTEMS:  ROS unobtainable due to patient being on the vent.  DRUG ALLERGIES:  No Known Allergies VITALS:  Blood pressure (!) 135/91, pulse 93, temperature 98.6 F (37 C), temperature source Axillary, resp. rate (!) 22, height 6\' 3"  (1.905 m), weight (!) 178.6 kg, SpO2 95 %. PHYSICAL EXAMINATION:   Physical Exam  Constitutional: No distress.  Patient sedated on the vent.  HENT:  Head: Normocephalic and atraumatic.  Eyes: Pupils are equal, round, and reactive to light. Conjunctivae are normal. Right eye exhibits no discharge.  Neck: Neck supple. No thyromegaly present.  Cardiovascular: Normal rate, regular rhythm and normal heart sounds.  Respiratory: Effort normal. He has rales.  GI: Soft. Bowel sounds are normal.  Central adiposity  Musculoskeletal:        General: Edema present.     Comments: Venous stasis changes on both lower extremity.  Neurological:  Sedated on the vent  Skin: Skin is warm. No erythema.  Psychiatric:  Sedated on the vent   LABORATORY PANEL:  Male CBC Recent Labs  Lab 10/14/18 0332  WBC 5.9  HGB 13.6  HCT 54.0*  PLT 114*   ------------------------------------------------------------------------------------------------------------------ Chemistries  Recent Labs  Lab 10/14/18 0332  NA 139  K 4.4  CL 98  CO2 32  GLUCOSE 105*  BUN 48*  CREATININE 2.23*  CALCIUM 7.5*   RADIOLOGY:  Dg Chest Port 1 View  Result Date: 10/14/2018 CLINICAL DATA:  Intubation EXAM: PORTABLE CHEST 1 VIEW COMPARISON:  Yesterday FINDINGS: New endotracheal tube with tip at the clavicular heads. The orogastric tube reaches the diaphragm at least. Cardiomegaly with vascular pedicle  widening and aortic tortuosity. Vascular congestion/edema. Increased hazy density at the bases from atelectasis or layering pleural fluid. IMPRESSION: 1. New hardware in unremarkable position. 2. Increasing hazy density of the lower chest likely reflecting atelectasis or effusions. 3. Cardiomegaly and vascular congestion. Electronically Signed   By: Monte Fantasia M.D.   On: 10/14/2018 04:13   Dg Chest Portable 1 View  Result Date: 10/13/2018 CLINICAL DATA:  Shortness of breath EXAM: PORTABLE CHEST 1 VIEW COMPARISON:  06/26/2018 FINDINGS: Cardiomegaly. Pulmonary vascular prominence and mild diffuse interstitial pulmonary opacity. The visualized skeletal structures are unremarkable. IMPRESSION: Cardiomegaly with pulmonary vascular prominence and probable mild edema. No focal airspace opacity. Findings are similar to prior examination dated 06/26/2018. Electronically Signed   By: Eddie Candle M.D.   On: 10/13/2018 21:21   Dg Abd Portable 1v  Result Date: 10/14/2018 CLINICAL DATA:  Intubation EXAM: PORTABLE ABDOMEN - 1 VIEW COMPARISON:  03/27/2014 FINDINGS: The orogastric tube tip is in the distal stomach. The side port also overlaps the stomach. Visualized bowel gas pattern is normal. IMPRESSION: The orogastric tube reaches the stomach. Electronically Signed   By: Monte Fantasia M.D.   On: 10/14/2018 04:39   ASSESSMENT AND PLAN:   1. Acute hypoxic and hypercapnic respiratory failure Secondary to CHF exacerbation.  Initially required BiPAP.  Patient subsequently intubated and sedated on the vent.  Being managed in the ICU.  2. Acute on chronic combined systolic and diastolic CHF (congestive heart failure) ([name])  Continue diuresis with IV Lasix. Cardiology consulted  3.  HTN (hypertension) -  Blood pressure controlled.  Home dose antihypertensives  4.  Sleep apnea -patient now intubated.  5.  Acute on chronic thrombocytopenia Platelet count improved to 114 No bleeding.  Monitor closely.  6.   Acute kidney injury Patient being managed by ICU team.  To consider nephrology consult if no improvement in renal function  7.  Morbid obesity with BMI of 49.21. To encourage lifestyle modifications and weight loss after patient is extubated  DVT prophylaxis; heparin  All the records are reviewed and case discussed with Care Management/Social Worker. Management plans discussed with the patient, family and they are in agreement.  CODE STATUS: Full Code  TOTAL TIME TAKING CARE OF THIS PATIENT: 38 minutes.   More than 50% of the time was spent in counseling/coordination of care: YES  POSSIBLE D/C IN 3 DAYS, DEPENDING ON CLINICAL CONDITION.   Joline Encalada M.D on 10/14/2018 at 1:51 PM  Between 7am to 6pm - Pager - [phone]  After 6pm go to [email or web address] - Technical brewer [facility]  Office  [phone]  CC: Primary care physician; Center, [facility]  Note: This dictation was prepared with Dragon dictation along with smaller phrase technology. Any transcriptional errors that result from this process are unintentional.

## 2018-10-14 NOTE — Progress Notes (Signed)
PHARMACIST - PHYSICIAN COMMUNICATION  CONCERNING: IV to Oral Route Change Policy  RECOMMENDATION: This patient is receiving famotidine by the intravenous route.  Based on criteria approved by the Pharmacy and Therapeutics Committee, the intravenous medication(s) is/are being converted to the equivalent oral dose form(s).   DESCRIPTION: These criteria include:  The patient is eating (either orally or via tube) and/or has been taking other orally administered medications for a least 24 hours  The patient has no evidence of active gastrointestinal bleeding or impaired GI absorption (gastrectomy, short bowel, patient on TNA or NPO).  If you have questions about this conversion, please contact the Pharmacy Department   []   [phone] )  [place], [facility] 10/14/2018 5:05 PM

## 2018-10-14 NOTE — Progress Notes (Signed)
Received call from [place] Wright--pt's significant other.  Updated her with pt's condition and plan of care.  Asked that she speak to pt's sister, [name], and set up password so there is no confusion going forward about who can and cannot call to get info about pt.  Ardyth Gal and/or [name] will call back in the morning to secure password.  Contact information for Ardyth Gal added to patient's chart.

## 2018-10-15 ENCOUNTER — Inpatient Hospital Stay: Payer: Medicaid Other

## 2018-10-15 LAB — BLOOD GAS, ARTERIAL
Acid-Base Excess: 14.8 mmol/L — ABNORMAL HIGH (ref 0.0–2.0)
Acid-Base Excess: 14.1 mmol/L — ABNORMAL HIGH (ref 0.0–2.0)
Bicarbonate: 36.8 mmol/L — ABNORMAL HIGH (ref 20.0–28.0)
Bicarbonate: 37.6 mmol/L — ABNORMAL HIGH (ref 20.0–28.0)
FIO2: 35
FIO2: 0.35
MECHVT: 550 mL
MECHVT: 550 mL
O2 Saturation: 94.7 %
O2 Saturation: 94 %
PEEP: 10 cmH2O
PEEP: 10 cmH2O
Patient temperature: 37
Patient temperature: 37
RATE: 26 resp/min
RATE: 18 resp/min
pCO2 arterial: 35 mmHg (ref 32.0–48.0)
pCO2 arterial: 41 mmHg (ref 32.0–48.0)
pH, Arterial: 7.63 (ref 7.350–7.450)
pH, Arterial: 7.57 — ABNORMAL HIGH (ref 7.350–7.450)
pO2, Arterial: 59 mmHg — ABNORMAL LOW (ref 83.0–108.0)
pO2, Arterial: 60 mmHg — ABNORMAL LOW (ref 83.0–108.0)

## 2018-10-15 LAB — CBC
HCT: 48.5 % (ref 39.0–52.0)
Hemoglobin: 13.6 g/dL (ref 13.0–17.0)
MCH: 21.3 pg — ABNORMAL LOW (ref 26.0–34.0)
MCHC: 28 g/dL — ABNORMAL LOW (ref 30.0–36.0)
MCV: 75.8 fL — ABNORMAL LOW (ref 80.0–100.0)
Platelets: 116 10*3/uL — ABNORMAL LOW (ref 150–400)
RBC: 6.4 MIL/uL — ABNORMAL HIGH (ref 4.22–5.81)
RDW: 24.9 % — ABNORMAL HIGH (ref 11.5–15.5)
WBC: 7.7 10*3/uL (ref 4.0–10.5)
nRBC: 2.2 % — ABNORMAL HIGH (ref 0.0–0.2)

## 2018-10-15 LAB — GLUCOSE, CAPILLARY
Glucose-Capillary: 103 mg/dL — ABNORMAL HIGH (ref 70–99)
Glucose-Capillary: 69 mg/dL — ABNORMAL LOW (ref 70–99)
Glucose-Capillary: 70 mg/dL (ref 70–99)
Glucose-Capillary: 76 mg/dL (ref 70–99)
Glucose-Capillary: 86 mg/dL (ref 70–99)
Glucose-Capillary: 94 mg/dL (ref 70–99)

## 2018-10-15 LAB — BASIC METABOLIC PANEL
Anion gap: 13 (ref 5–15)
BUN: 45 mg/dL — ABNORMAL HIGH (ref 8–23)
CO2: 32 mmol/L (ref 22–32)
Calcium: 7.7 mg/dL — ABNORMAL LOW (ref 8.9–10.3)
Chloride: 99 mmol/L (ref 98–111)
Creatinine, Ser: 2.03 mg/dL — ABNORMAL HIGH (ref 0.61–1.24)
GFR calc Af Amer: 40 mL/min — ABNORMAL LOW (ref >60)
GFR calc non Af Amer: 34 mL/min — ABNORMAL LOW (ref >60)
Glucose, Bld: 73 mg/dL (ref 70–99)
Potassium: 3.4 mmol/L — ABNORMAL LOW (ref 3.5–5.1)
Sodium: 144 mmol/L (ref 135–145)

## 2018-10-15 LAB — TRIGLYCERIDES
Triglycerides: 453 mg/dL — ABNORMAL HIGH (ref <150)
Triglycerides: 482 mg/dL — ABNORMAL HIGH (ref <150)

## 2018-10-15 LAB — ALBUMIN: Albumin: 2.7 g/dL — ABNORMAL LOW (ref 3.5–5.0)

## 2018-10-15 LAB — PHOSPHORUS: Phosphorus: 2.4 mg/dL — ABNORMAL LOW (ref 2.5–4.6)

## 2018-10-15 LAB — MAGNESIUM: Magnesium: 2.1 mg/dL (ref 1.7–2.4)

## 2018-10-15 MED ORDER — FENTANYL 2500MCG IN NS 250ML (10MCG/ML) PREMIX INFUSION
0.0000 ug/h | INTRAVENOUS | Status: DC
  Administered 2018-10-15: 400 ug/h via INTRAVENOUS
  Administered 2018-10-15: 25 ug/h via INTRAVENOUS
  Administered 2018-10-15: 17:00:00 400 ug/h via INTRAVENOUS
  Administered 2018-10-16: 325 ug/h via INTRAVENOUS
  Filled 2018-10-15 (×4): qty 250

## 2018-10-15 MED ORDER — DEXTROSE 50 % IV SOLN
INTRAVENOUS | Status: AC
  Filled 2018-10-15: qty 50

## 2018-10-15 MED ORDER — ADULT MULTIVITAMIN LIQUID CH
15.0000 mL | Freq: Every day | ORAL | Status: DC
  Administered 2018-10-15 – 2018-10-17 (×3): 15 mL
  Filled 2018-10-15 (×5): qty 15

## 2018-10-15 MED ORDER — POTASSIUM CHLORIDE 20 MEQ PO PACK
40.0000 meq | PACK | Freq: Two times a day (BID) | ORAL | Status: AC
  Administered 2018-10-15 (×2): 40 meq
  Filled 2018-10-15 (×2): qty 2

## 2018-10-15 MED ORDER — MIDAZOLAM 50MG/50ML (1MG/ML) PREMIX INFUSION
0.5000 mg/h | INTRAVENOUS | Status: DC
  Administered 2018-10-15: 3 mg/h via INTRAVENOUS
  Administered 2018-10-15: 0.5 mg/h via INTRAVENOUS
  Administered 2018-10-16: 3 mg/h via INTRAVENOUS
  Filled 2018-10-15 (×3): qty 50

## 2018-10-15 MED ORDER — MIDAZOLAM HCL 2 MG/2ML IJ SOLN
2.0000 mg | INTRAMUSCULAR | Status: DC | PRN
  Administered 2018-10-15 – 2018-10-16 (×2): 2 mg via INTRAVENOUS
  Filled 2018-10-15: qty 2

## 2018-10-15 MED ORDER — PIVOT 1.5 CAL PO LIQD
1000.0000 mL | ORAL | Status: DC
  Administered 2018-10-15 – 2018-10-17 (×3): 1000 mL
  Filled 2018-10-15: qty 1000

## 2018-10-15 MED ORDER — DEXTROSE 50 % IV SOLN
12.5000 g | Freq: Once | INTRAVENOUS | Status: AC
  Administered 2018-10-15: 12.5 g via INTRAVENOUS

## 2018-10-15 MED ORDER — PRO-STAT SUGAR FREE PO LIQD
60.0000 mL | Freq: Every day | ORAL | Status: DC
  Administered 2018-10-15 – 2018-10-18 (×14): 60 mL

## 2018-10-15 MED ORDER — SENNOSIDES-DOCUSATE SODIUM 8.6-50 MG PO TABS
2.0000 | ORAL_TABLET | Freq: Two times a day (BID) | ORAL | Status: DC
  Administered 2018-10-15 – 2018-10-17 (×6): 2
  Filled 2018-10-15 (×6): qty 2

## 2018-10-15 NOTE — Progress Notes (Signed)
Initial Nutrition Assessment   DOCUMENTATION CODES:   Morbid obesity  INTERVENTION:  Initiate Pivot 1.5 at 20 mL/hr (480 mL goal daily volume) + Pro-Stat 60 mL 6 times daily per tube. Provides 1920 kcal, 225 grams of protein, 360 mL H2O daily.  Provide liquid MVI daily per tube to meet 100% RDIs for vitamins/minerals.  Provide minimum free water flush of 30 mL Q4hrs to maintain tube patency.  NUTRITION DIAGNOSIS:   Inadequate oral intake related to inability to eat as evidenced by NPO status.  GOAL:   Provide needs based on ASPEN/SCCM guidelines  MONITOR:   Vent status, Labs, Weight trends, TF tolerance, I & O's, Skin  REASON FOR ASSESSMENT:   Ventilator    ASSESSMENT:   63 year old male with PMHx of HTN, multiple gastric ulcers, CHF, sleep apnea, hx partial colectomy admitted with acute respiratory failure due to pulmonary edema requiring intubation on 6/30, also with acute renal failure.   Patient is intubated and sedated. On PRVC mode with FiO2 35% and PEEP 5 cmH2O. At time of assessment this AM patient was on propofol gtt at 54.4 mL/hr but has now been transitioned to different sedation. Abdomen is distended per RN documentation. Last BM unknown.  Enteral Access: OGT placed 6/30; terminates in distal stomach per abdominal x-ray 6/30; 59 cm at corner of mouth  MAP: 85-102 mmHg  Patient is currently intubated on ventilator support Ve: 13.5 L/min Temp (24hrs), Avg:98.8 F (37.1 C), Min:98.3 F (36.8 C), Max:99.4 F (37.4 C)  Propofol: N/A  Medications reviewed and include: famotidine 20 mg BID, Lasix 40 mg Q12hrs IV, potassium chloride 40 mEq BID per tube today, senna-docusate 2 tablet BID per tube, fentanyl gtt, Versed gtt.  Labs reviewed: CBG 69-94, Potassium 3.4, BUN 45, Creatinine 2.03, Phosphorus 2.4, Triglycerides 482.  I/O: 7225 mL UOP yesterday (1.8 mL/kg/hr)  Discussed with RN and on rounds. Plan per MD is to start low-dose feeds today.  NUTRITION -  FOCUSED PHYSICAL EXAM:    Most Recent Value  Orbital Region  No depletion  Upper Arm Region  No depletion  Thoracic and Lumbar Region  No depletion  Buccal Region  Unable to assess  Temple Region  No depletion  Clavicle Bone Region  No depletion  Clavicle and Acromion Bone Region  No depletion  Scapular Bone Region  Unable to assess  Dorsal Hand  No depletion  Patellar Region  No depletion  Anterior Thigh Region  No depletion  Posterior Calf Region  No depletion  Edema (RD Assessment)  Moderate  Hair  Reviewed  Eyes  Unable to assess  Mouth  Unable to assess  Skin  Reviewed  Nails  Reviewed     Diet Order:   Diet Order    None     EDUCATION NEEDS:   No education needs have been identified at this time  Skin:  Skin Assessment: Skin Integrity Issues:(weeping to bilateral legs)  Last BM:  Unknown  Height:   Ht Readings from Last 1 Encounters:  10/13/18 6\' 3"  (1.905 m)   Weight:   Wt Readings from Last 1 Encounters:  10/15/18 (!) 171 kg   Ideal Body Weight:  89.1 kg  BMI:  Body mass index is 47.12 kg/m.  Estimated Nutritional Needs:   Kcal:  6384-6659 (11-14 kcal/kg)  Protein:  223 grams (2.5 grams/kg IBW)  Fluid:  2.2-2.6 L/day  Willey Blade, MS, RD, LDN Office: [phone] Pager: [phone] After Hours/Weekend Pager: [phone]

## 2018-10-15 NOTE — Progress Notes (Signed)
Pharmacy Electrolyte Monitoring Consult:  Pharmacy consulted to assist in monitoring and replacing electrolytes in this 63 y.o. male admitted on 10/13/2018. Patient admitted with acute respiratory failure and CHF exacerbation. Patient is requiring mechanical ventilation.    Labs:  Sodium (mmol/L)  Date Value  10/15/2018 144  04/15/2014 140   Potassium (mmol/L)  Date Value  10/15/2018 3.4 (L)  04/15/2014 3.9   Magnesium (mg/dL)  Date Value  10/15/2018 2.1  08/21/2012 1.9   Phosphorus (mg/dL)  Date Value  10/15/2018 2.4 (L)   Calcium (mg/dL)  Date Value  10/15/2018 7.7 (L)   Calcium, Total (mg/dL)  Date Value  04/15/2014 8.3 (L)   Albumin (g/dL)  Date Value  10/15/2018 2.7 (L)  04/15/2014 2.9 (L)   Corrected Calcium: 8.7  Assessment/Plan: Patient receiving furosemide 40mg  IV Q12hr.   Potassium 53mEq VT BID x 2 doses. Will continue to replace via tube in setting of CHF and diuresis.   Will replace for goal potassium ~4 and goal magnesium ~ 2.   Will initiate Senna/Docusate 2 tabs VT BID.   Pharmacy will continue to monitor and adjust per consult.   Gizell Danser L 10/15/2018 12:11 PM

## 2018-10-15 NOTE — Progress Notes (Signed)
[facility] at [place] NAME: Gary [name]    MR#:  [id number]  DATE OF BIRTH:  1955/06/08  SUBJECTIVE:  CHIEF COMPLAINT:   Chief Complaint  Patient presents with  . Shortness of Breath   Patient currently sedated on the vent.  REVIEW OF SYSTEMS:  ROS unobtainable due to patient being on the vent.  DRUG ALLERGIES:  No Known Allergies VITALS:  Blood pressure 106/78, pulse 71, temperature 98.3 F (36.8 C), temperature source Axillary, resp. rate (!) 21, height 6\' 3"  (1.905 m), weight (!) 171 kg, SpO2 92 %. PHYSICAL EXAMINATION:   Physical Exam  Constitutional: No distress.  Patient sedated on the vent.  HENT:  Head: Normocephalic and atraumatic.  Eyes: Pupils are equal, round, and reactive to light. Conjunctivae are normal. Right eye exhibits no discharge.  Neck: Neck supple. No thyromegaly present.  Cardiovascular: Normal rate, regular rhythm and normal heart sounds.  Respiratory: Effort normal. He has rales.  GI: Soft. Bowel sounds are normal.  Central adiposity  Musculoskeletal:        General: Edema present.     Comments: Venous stasis changes on both lower extremity.  Neurological:  Sedated on the vent  Skin: Skin is warm. No erythema.  Psychiatric:  Sedated on the vent   LABORATORY PANEL:  Male CBC Recent Labs  Lab 10/15/18 0418  WBC 7.7  HGB 13.6  HCT 48.5  PLT 116*   ------------------------------------------------------------------------------------------------------------------ Chemistries  Recent Labs  Lab 10/15/18 0418  NA 144  K 3.4*  CL 99  CO2 32  GLUCOSE 73  BUN 45*  CREATININE 2.03*  CALCIUM 7.7*  MG 2.1   RADIOLOGY:  Dg Chest Port 1 View  Result Date: 10/15/2018 CLINICAL DATA:  Acute respiratory distress. EXAM: PORTABLE CHEST 1 VIEW COMPARISON:  Radiograph of October 14, 2018. FINDINGS: Stable cardiomegaly. Endotracheal and nasogastric tubes are unchanged in position. No pneumothorax is  noted. Increased right basilar opacity is noted concerning for worsening atelectasis or infiltrate with associated pleural effusion. Left lung is clear. Bony thorax is unremarkable. IMPRESSION: Stable support apparatus. Increased right basilar opacity is noted concerning for worsening atelectasis or infiltrate with associated pleural effusion. Electronically Signed   By: Marijo Conception M.D.   On: 10/15/2018 07:07   ASSESSMENT AND PLAN:   1. Acute hypoxic and hypercapnic respiratory failure Secondary to CHF exacerbation.  Initially required BiPAP.  Patient subsequently intubated and sedated on the vent.  Being managed in the ICU.  2. Acute on chronic combined systolic and diastolic CHF (congestive heart failure) ([name])  Continue diuresis with IV Lasix. Cardiology following  3.  HTN (hypertension) - Blood pressure controlled.  Home dose antihypertensives  4.  Sleep apnea -patient now intubated.  5.  Acute on chronic thrombocytopenia Platelet count improved to 116 No bleeding.  Monitor closely.  6.  Acute kidney injury Patient being managed by ICU team.  Renal function fairly stable this morning  7.  Morbid obesity with BMI of 49.21. To encourage lifestyle modifications and weight loss after patient is extubated  DVT prophylaxis; heparin  All the records are reviewed and case discussed with Care Management/Social Worker. Management plans discussed with the patient, family and they are in agreement.  CODE STATUS: Full Code  TOTAL TIME TAKING CARE OF THIS PATIENT: 36 minutes.   More than 50% of the time was spent in counseling/coordination of care: YES  POSSIBLE D/C IN 3 DAYS, DEPENDING ON CLINICAL CONDITION.  Jisel Fleet M.D on 10/15/2018 at 2:11 PM  Between 7am to 6pm - Pager - [phone]  After 6pm go to [email or web address] - Technical brewer [facility]  Office  [phone]  CC: Primary care physician; Center, [facility]  Note:  This dictation was prepared with Dragon dictation along with smaller phrase technology. Any transcriptional errors that result from this process are unintentional.

## 2018-10-15 NOTE — Progress Notes (Signed)
Hypoglycemic Event  CBG: 69  Treatment: 1/2 amp D50  Symptoms: none  Follow-up CBG: Time: 0814 CBG Result:94  Possible Reasons for Event:  NPO  Comments/MD notified: Dr. Era Skeen

## 2018-10-15 NOTE — Plan of Care (Signed)

## 2018-10-15 NOTE — Progress Notes (Signed)
Shift summary:   Remains intubated and sedated. Sedation changed from Propofol to Fentanyl+Versed. Failed SBT this AM. TFs initiated. CBGs initiated. CHG bath complete. Restraints initiated for safety as well.

## 2018-10-15 NOTE — Progress Notes (Signed)
SUBJECTIVE: Intubated sedated.   Vitals:   10/15/18 0800 10/15/18 0815 10/15/18 0900 10/15/18 1000  BP:  128/88 126/77 114/71  Pulse:      Resp: (!) 21 17 (!) 22 18  Temp: 98.5 F (36.9 C)     TempSrc: Axillary     SpO2:  98%    Weight:      Height:        Intake/Output Summary (Last 24 hours) at 10/15/2018 1045 Last data filed at 10/15/2018 1000 Gross per 24 hour  Intake 1397.26 ml  Output 8625 ml  Net -7227.74 ml    LABS: Basic Metabolic Panel: Recent Labs    10/14/18 1719 10/15/18 0418  NA 141 144  K 3.7 3.4*  CL 97* 99  CO2 30 32  GLUCOSE 71 73  BUN 46* 45*  CREATININE 2.06* 2.03*  CALCIUM 7.7* 7.7*  MG  --  2.1  PHOS  --  2.4*   Liver Function Tests: Recent Labs    10/15/18 0529  ALBUMIN 2.7*   No results for input(s): LIPASE, AMYLASE in the last 72 hours. CBC: Recent Labs    10/13/18 2058 10/14/18 0332 10/15/18 0418  WBC 6.8 5.9 7.7  NEUTROABS 4.4  --   --   HGB 13.7 13.6 13.6  HCT 52.6* 54.0* 48.5  MCV 82.3 83.9 75.8*  PLT 129* 114* 116*   Cardiac Enzymes: No results for input(s): CKTOTAL, CKMB, CKMBINDEX, TROPONINI in the last 72 hours. BNP: Invalid input(s): POCBNP D-Dimer: No results for input(s): DDIMER in the last 72 hours. Hemoglobin A1C: No results for input(s): HGBA1C in the last 72 hours. Fasting Lipid Panel: Recent Labs    10/15/18 0529  TRIG 482*   Thyroid Function Tests: No results for input(s): TSH, T4TOTAL, T3FREE, THYROIDAB in the last 72 hours.  Invalid input(s): FREET3 Anemia Panel: No results for input(s): VITAMINB12, FOLATE, FERRITIN, TIBC, IRON, RETICCTPCT in the last 72 hours.   PHYSICAL EXAM General: Critically ill, intubated, sedated HEENT:  Normocephalic and atramatic Neck:  No JVD.  Lungs: Clear bilaterally to auscultation and percussion. Heart: HRRR . Normal S1 and S2 without gallops or murmurs.  Abdomen: Bowel sounds are positive, abdomen soft and non-tender  Msk:  Back normal, normal gait. Normal  strength and tone for age. Extremities: No clubbing, cyanosis or edema.   Neuro: Sedated Psych:  Intubated  TELEMETRY: NSR 77bpm  ASSESSMENT AND PLAN: Congestive heart failure with history of diastolic dysfunction end-stage for kidney disease.   Patient remains critically ill, intubated and sedated.  Continue current respiratory support, wean as tolerated and continue diuretics.  Principal Problem:   Acute respiratory failure with hypercapnia ([name]) Active Problems:   HTN (hypertension)   Sleep apnea   Acute on chronic combined systolic and diastolic CHF (congestive heart failure) ([place])    Jake Bathe, NP-C 10/15/2018 10:45 AM Cell: [phone]
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
## 2018-10-16 ENCOUNTER — Inpatient Hospital Stay: Payer: Medicaid Other

## 2018-10-16 LAB — GLUCOSE, CAPILLARY
Glucose-Capillary: 87 mg/dL (ref 70–99)
Glucose-Capillary: 84 mg/dL (ref 70–99)
Glucose-Capillary: 105 mg/dL — ABNORMAL HIGH (ref 70–99)
Glucose-Capillary: 115 mg/dL — ABNORMAL HIGH (ref 70–99)
Glucose-Capillary: 108 mg/dL — ABNORMAL HIGH (ref 70–99)
Glucose-Capillary: 135 mg/dL — ABNORMAL HIGH (ref 70–99)

## 2018-10-16 LAB — BASIC METABOLIC PANEL
Anion gap: 9 (ref 5–15)
BUN: 43 mg/dL — ABNORMAL HIGH (ref 8–23)
CO2: 35 mmol/L — ABNORMAL HIGH (ref 22–32)
Calcium: 7.6 mg/dL — ABNORMAL LOW (ref 8.9–10.3)
Chloride: 102 mmol/L (ref 98–111)
Creatinine, Ser: 1.9 mg/dL — ABNORMAL HIGH (ref 0.61–1.24)
GFR calc Af Amer: 43 mL/min — ABNORMAL LOW (ref >60)
GFR calc non Af Amer: 37 mL/min — ABNORMAL LOW (ref >60)
Glucose, Bld: 97 mg/dL (ref 70–99)
Potassium: 3.4 mmol/L — ABNORMAL LOW (ref 3.5–5.1)
Sodium: 146 mmol/L — ABNORMAL HIGH (ref 135–145)

## 2018-10-16 LAB — PHOSPHORUS: Phosphorus: 3.5 mg/dL (ref 2.5–4.6)

## 2018-10-16 LAB — PROCALCITONIN: Procalcitonin: 0.27 ng/mL

## 2018-10-16 LAB — CBC
HCT: 51.6 % (ref 39.0–52.0)
Hemoglobin: 13.6 g/dL (ref 13.0–17.0)
MCH: 20.7 pg — ABNORMAL LOW (ref 26.0–34.0)
MCHC: 26.4 g/dL — ABNORMAL LOW (ref 30.0–36.0)
MCV: 78.4 fL — ABNORMAL LOW (ref 80.0–100.0)
Platelets: 110 10*3/uL — ABNORMAL LOW (ref 150–400)
RBC: 6.58 MIL/uL — ABNORMAL HIGH (ref 4.22–5.81)
RDW: 24.6 % — ABNORMAL HIGH (ref 11.5–15.5)
WBC: 6.8 10*3/uL (ref 4.0–10.5)
nRBC: 0.6 % — ABNORMAL HIGH (ref 0.0–0.2)

## 2018-10-16 LAB — MAGNESIUM: Magnesium: 2 mg/dL (ref 1.7–2.4)

## 2018-10-16 MED ORDER — FENTANYL CITRATE (PF) 100 MCG/2ML IJ SOLN
50.0000 ug | INTRAMUSCULAR | Status: DC | PRN
  Administered 2018-10-16 (×2): 100 ug via INTRAVENOUS
  Filled 2018-10-16 (×2): qty 2

## 2018-10-16 MED ORDER — MIDAZOLAM HCL 2 MG/2ML IJ SOLN
INTRAMUSCULAR | Status: AC
  Filled 2018-10-16: qty 2

## 2018-10-16 MED ORDER — MIDAZOLAM HCL 2 MG/2ML IJ SOLN
2.0000 mg | INTRAMUSCULAR | Status: DC | PRN
  Administered 2018-10-16 – 2018-10-18 (×8): 2 mg via INTRAVENOUS
  Filled 2018-10-16 (×8): qty 2

## 2018-10-16 MED ORDER — MIDAZOLAM HCL 2 MG/2ML IJ SOLN
2.0000 mg | INTRAMUSCULAR | Status: AC | PRN
  Administered 2018-10-17 (×3): 2 mg via INTRAVENOUS
  Filled 2018-10-16 (×3): qty 2

## 2018-10-16 MED ORDER — POTASSIUM CHLORIDE 20 MEQ PO PACK
40.0000 meq | PACK | Freq: Once | ORAL | Status: AC
  Administered 2018-10-16: 40 meq
  Filled 2018-10-16: qty 2

## 2018-10-16 MED ORDER — MIDAZOLAM HCL 2 MG/2ML IJ SOLN
2.0000 mg | Freq: Once | INTRAMUSCULAR | Status: DC
  Filled 2018-10-16: qty 2

## 2018-10-16 MED ORDER — DEXMEDETOMIDINE HCL IN NACL 400 MCG/100ML IV SOLN
0.4000 ug/kg/h | INTRAVENOUS | Status: DC
  Administered 2018-10-16: 0.5 ug/kg/h via INTRAVENOUS
  Administered 2018-10-16: 0.6 ug/kg/h via INTRAVENOUS
  Administered 2018-10-16 (×2): 0.4 ug/kg/h via INTRAVENOUS
  Administered 2018-10-17 (×4): 1.2 ug/kg/h via INTRAVENOUS
  Administered 2018-10-17: 0.9 ug/kg/h via INTRAVENOUS
  Filled 2018-10-16 (×10): qty 100

## 2018-10-16 MED ORDER — POLYETHYLENE GLYCOL 3350 17 G PO PACK
17.0000 g | PACK | Freq: Every day | ORAL | Status: DC
  Administered 2018-10-16 – 2018-10-17 (×2): 17 g
  Filled 2018-10-16 (×2): qty 1

## 2018-10-16 MED ORDER — HYDRALAZINE HCL 20 MG/ML IJ SOLN
10.0000 mg | Freq: Four times a day (QID) | INTRAMUSCULAR | Status: DC | PRN
  Filled 2018-10-16: qty 1

## 2018-10-16 NOTE — Progress Notes (Signed)
SUBJECTIVE: Intubated sedated.   Vitals:   10/16/18 0700 10/16/18 0722 10/16/18 0800 10/16/18 0900  BP: 119/83  (!) 127/92 121/85  Pulse: 66  66 66  Resp: 18  18 16   Temp:   98.4 F (36.9 C)   TempSrc:   Oral   SpO2: 100% 100% 100% 100%  Weight:      Height:        Intake/Output Summary (Last 24 hours) at 10/16/2018 0947 Last data filed at 10/16/2018 0912 Gross per 24 hour  Intake 1579.45 ml  Output 6560 ml  Net -4980.55 ml    LABS: Basic Metabolic Panel: Recent Labs    10/15/18 0418 10/16/18 0512  NA 144 146*  K 3.4* 3.4*  CL 99 102  CO2 32 35*  GLUCOSE 73 97  BUN 45* 43*  CREATININE 2.03* 1.90*  CALCIUM 7.7* 7.6*  MG 2.1 2.0  PHOS 2.4* 3.5   Liver Function Tests: Recent Labs    10/15/18 0529  ALBUMIN 2.7*   No results for input(s): LIPASE, AMYLASE in the last 72 hours. CBC: Recent Labs    10/13/18 2058  10/15/18 0418 10/16/18 0512  WBC 6.8   < > 7.7 6.8  NEUTROABS 4.4  --   --   --   HGB 13.7   < > 13.6 13.6  HCT 52.6*   < > 48.5 51.6  MCV 82.3   < > 75.8* 78.4*  PLT 129*   < > 116* 110*   < > = values in this interval not displayed.   Cardiac Enzymes: No results for input(s): CKTOTAL, CKMB, CKMBINDEX, TROPONINI in the last 72 hours. BNP: Invalid input(s): POCBNP D-Dimer: No results for input(s): DDIMER in the last 72 hours. Hemoglobin A1C: No results for input(s): HGBA1C in the last 72 hours. Fasting Lipid Panel: Recent Labs    10/15/18 0529  TRIG 482*   Thyroid Function Tests: No results for input(s): TSH, T4TOTAL, T3FREE, THYROIDAB in the last 72 hours.  Invalid input(s): FREET3 Anemia Panel: No results for input(s): VITAMINB12, FOLATE, FERRITIN, TIBC, IRON, RETICCTPCT in the last 72 hours.   PHYSICAL EXAM General: Critically ill, intubated, sedated HEENT:  Normocephalic and atramatic Neck:  No JVD.  Lungs: Clear bilaterally to auscultation and percussion. Heart: HRRR . Normal S1 and S2 without gallops or murmurs.  Abdomen:  Bowel sounds are positive, abdomen soft and non-tender  Msk:  Back normal, normal gait. Normal strength and tone for age. Extremities: No clubbing, cyanosis or edema.   Neuro: Sedated Psych:  Intubated  TELEMETRY: NSR 77bpm  ASSESSMENT AND PLAN: Congestive heart failure with history of diastolic dysfunction end-stage for kidney disease.   Patient remains critically ill, intubated and sedated.  Continue current respiratory support, wean as tolerated and continue diuretics.  Principal Problem:   Acute respiratory failure with hypercapnia ([name]) Active Problems:   HTN (hypertension)   Sleep apnea   Acute on chronic combined systolic and diastolic CHF (congestive heart failure) ([place])    Jake Bathe, NP-C 10/16/2018 9:47 AM Cell: [phone]

## 2018-10-16 NOTE — Progress Notes (Signed)
[place] [facility] [place] NAME: Gary [name]    MR#:  [id number]  DATE OF BIRTH:  Jul 12, 1955  SUBJECTIVE:  CHIEF COMPLAINT:   Chief Complaint  Patient presents with  . Shortness of Breath   Patient currently sedated on the vent.  REVIEW OF SYSTEMS:  ROS unobtainable due to patient being on the vent.  DRUG ALLERGIES:  No Known Allergies VITALS:  Blood pressure (!) 144/105, pulse 64, temperature 98.4 F (36.9 C), temperature source Oral, resp. rate 18, height 6\' 3"  (1.905 m), weight (!) 167.4 kg, SpO2 100 %. PHYSICAL EXAMINATION:   Physical Exam  Constitutional: No distress.  Patient sedated on the vent.  HENT:  Head: Normocephalic and atraumatic.  Eyes: Pupils are equal, round, and reactive to light. Conjunctivae are normal. Right eye exhibits no discharge.  Neck: Neck supple. No thyromegaly present.  Cardiovascular: Normal rate, regular rhythm and normal heart sounds.  Respiratory: Effort normal. He has rales.  GI: Soft. Bowel sounds are normal.  Central adiposity  Musculoskeletal:        General: Edema present.     Comments: Venous stasis changes on both lower extremity.  Neurological:  Sedated on the vent  Skin: Skin is warm. No erythema.  Psychiatric:  Sedated on the vent   LABORATORY PANEL:  Male CBC Recent Labs  Lab 10/16/18 0512  WBC 6.8  HGB 13.6  HCT 51.6  PLT 110*   ------------------------------------------------------------------------------------------------------------------ Chemistries  Recent Labs  Lab 10/16/18 0512  NA 146*  K 3.4*  CL 102  CO2 35*  GLUCOSE 97  BUN 43*  CREATININE 1.90*  CALCIUM 7.6*  MG 2.0   RADIOLOGY:  Dg Chest Port 1 View  Result Date: 10/16/2018 CLINICAL DATA:  Acute respiratory failure EXAM: PORTABLE CHEST 1 VIEW COMPARISON:  10/15/2018, 10/14/2018, 10/13/2018 FINDINGS: Endotracheal tube tip is 4.1 cm superior to the carina. Esophageal tube tip extends towards the  diaphragm but is poorly visible distally. Cardiomegaly with slight worsening of vascular congestion. Diffuse hazy lung opacity, likely due to edema. Probable pleural effusions. Stable bibasilar consolidations. IMPRESSION: Cardiomegaly with worsened vascular congestion and increasing bilateral hazy and ground-glass opacity, suspicious for pulmonary edema. Probable pleural effusions with worsening atelectasis or consolidation at both lung bases. Electronically Signed   By: Donavan Foil M.D.   On: 10/16/2018 02:56   ASSESSMENT AND PLAN:   1. Acute hypoxic and hypercapnic respiratory failure Secondary to CHF exacerbation.  Initially required BiPAP.  Patient subsequently intubated and sedated on the vent.  Being managed in the ICU.  2. Acute on chronic combined systolic and diastolic CHF (congestive heart failure) ([name])  Continue diuresis with IV Lasix. Cardiology following  3.  HTN (hypertension) - Blood pressure controlled.  Home dose antihypertensives  4.  Sleep apnea -patient now intubated.  5.  Acute on chronic thrombocytopenia Platelet count stable at 110 No bleeding.  Monitor closely.  6.  Acute kidney injury Patient being managed by ICU team.  Renal function fairly stable this morning  7.  Morbid obesity with BMI of 49.21. To encourage lifestyle modifications and weight loss after patient is extubated  DVT prophylaxis; heparin  All the records are reviewed and case discussed with Care Management/Social Worker.  CODE STATUS: Full Code  TOTAL TIME TAKING CARE OF THIS PATIENT: 34 minutes.   More than 50% of the time was spent in counseling/coordination of care: YES  POSSIBLE D/C IN 3 DAYS, DEPENDING ON CLINICAL CONDITION.  Cayleigh Paull M.D on 10/16/2018 at 1:57 PM  Between 7am to 6pm - Pager - [phone]  After 6pm go to [email or web address] - [employer] [place]  Office  [phone]  CC: Primary care physician; Center, [facility]  Note: This dictation was prepared with Dragon dictation along with smaller phrase technology. Any transcriptional errors that result from this process are unintentional.
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
## 2018-10-16 NOTE — Progress Notes (Signed)
Pharmacy Electrolyte Monitoring Consult:  Pharmacy consulted to assist in monitoring and replacing electrolytes in this 63 y.o. male admitted on 10/13/2018. Patient admitted with acute respiratory failure and CHF exacerbation. Patient is requiring mechanical ventilation.    Labs:  Sodium (mmol/L)  Date Value  10/16/2018 146 (H)  04/15/2014 140   Potassium (mmol/L)  Date Value  10/16/2018 3.4 (L)  04/15/2014 3.9   Magnesium (mg/dL)  Date Value  10/16/2018 2.0  08/21/2012 1.9   Phosphorus (mg/dL)  Date Value  10/16/2018 3.5   Calcium (mg/dL)  Date Value  10/16/2018 7.6 (L)   Calcium, Total (mg/dL)  Date Value  04/15/2014 8.3 (L)   Albumin (g/dL)  Date Value  10/15/2018 2.7 (L)  04/15/2014 2.9 (L)   Corrected Calcium: 8.6  Assessment/Plan: Patient receiving furosemide 40mg  IV Q12hr.   Potassium 19mEq VT BID x 2 doses. Will continue to replace via tube in setting of CHF and diuresis.   Will replace for goal potassium ~4 and goal magnesium ~ 2.   Continue Senna/Docusate 2 tabs VT BID. Add Miralax daily.   Pharmacy will continue to monitor and adjust per consult.   Demmi Sindt L 10/16/2018 1:43 PM

## 2018-10-16 NOTE — Progress Notes (Signed)
Updated pts sister [name] via telephone regarding pt condition and plan of care all questions were answered.  Will continue to monitor and assess pt.  [name], [place] Pager [phone] (please enter 7 digits) PCCM Consult Pager [phone] (please enter 7 digits)

## 2018-10-17 ENCOUNTER — Inpatient Hospital Stay: Payer: Medicaid Other

## 2018-10-17 LAB — BASIC METABOLIC PANEL
Anion gap: 8 (ref 5–15)
BUN: 51 mg/dL — ABNORMAL HIGH (ref 8–23)
CO2: 31 mmol/L (ref 22–32)
Calcium: 8.2 mg/dL — ABNORMAL LOW (ref 8.9–10.3)
Chloride: 107 mmol/L (ref 98–111)
Creatinine, Ser: 1.45 mg/dL — ABNORMAL HIGH (ref 0.61–1.24)
GFR calc Af Amer: 60 mL/min — ABNORMAL LOW (ref >60)
GFR calc non Af Amer: 52 mL/min — ABNORMAL LOW (ref >60)
Glucose, Bld: 126 mg/dL — ABNORMAL HIGH (ref 70–99)
Potassium: 3.5 mmol/L (ref 3.5–5.1)
Sodium: 146 mmol/L — ABNORMAL HIGH (ref 135–145)

## 2018-10-17 LAB — CBC
HCT: 59.9 % — ABNORMAL HIGH (ref 39.0–52.0)
Hemoglobin: 15.8 g/dL (ref 13.0–17.0)
MCH: 20.9 pg — ABNORMAL LOW (ref 26.0–34.0)
MCHC: 26.4 g/dL — ABNORMAL LOW (ref 30.0–36.0)
MCV: 79.3 fL — ABNORMAL LOW (ref 80.0–100.0)
Platelets: 110 10*3/uL — ABNORMAL LOW (ref 150–400)
RBC: 7.55 MIL/uL — ABNORMAL HIGH (ref 4.22–5.81)
RDW: 25.5 % — ABNORMAL HIGH (ref 11.5–15.5)
WBC: 5 10*3/uL (ref 4.0–10.5)
nRBC: 1 % — ABNORMAL HIGH (ref 0.0–0.2)

## 2018-10-17 LAB — GLUCOSE, CAPILLARY
Glucose-Capillary: 104 mg/dL — ABNORMAL HIGH (ref 70–99)
Glucose-Capillary: 139 mg/dL — ABNORMAL HIGH (ref 70–99)
Glucose-Capillary: 142 mg/dL — ABNORMAL HIGH (ref 70–99)
Glucose-Capillary: 139 mg/dL — ABNORMAL HIGH (ref 70–99)
Glucose-Capillary: 112 mg/dL — ABNORMAL HIGH (ref 70–99)

## 2018-10-17 LAB — MAGNESIUM: Magnesium: 1.9 mg/dL (ref 1.7–2.4)

## 2018-10-17 MED ORDER — IPRATROPIUM-ALBUTEROL 0.5-2.5 (3) MG/3ML IN SOLN
RESPIRATORY_TRACT | Status: AC
  Filled 2018-10-17: qty 3

## 2018-10-17 MED ORDER — POTASSIUM CHLORIDE 20 MEQ PO PACK
40.0000 meq | PACK | Freq: Two times a day (BID) | ORAL | Status: DC
  Administered 2018-10-17 (×2): 40 meq
  Filled 2018-10-17 (×2): qty 2

## 2018-10-17 MED ORDER — FENTANYL CITRATE (PF) 100 MCG/2ML IJ SOLN
25.0000 ug | INTRAMUSCULAR | Status: DC | PRN
  Administered 2018-10-17 (×5): 50 ug via INTRAVENOUS
  Filled 2018-10-17 (×5): qty 2

## 2018-10-17 MED ORDER — OXYCODONE HCL 5 MG PO TABS
5.0000 mg | ORAL_TABLET | Freq: Three times a day (TID) | ORAL | Status: DC
  Administered 2018-10-17 – 2018-10-18 (×3): 5 mg
  Filled 2018-10-17 (×3): qty 1

## 2018-10-17 MED ORDER — METHYLPREDNISOLONE SODIUM SUCC 125 MG IJ SOLR
80.0000 mg | Freq: Once | INTRAMUSCULAR | Status: AC
  Administered 2018-10-17: 80 mg via INTRAVENOUS
  Filled 2018-10-17: qty 2

## 2018-10-17 MED ORDER — MAGNESIUM SULFATE IN D5W 1-5 GM/100ML-% IV SOLN
1.0000 g | Freq: Once | INTRAVENOUS | Status: AC
  Administered 2018-10-17: 1 g via INTRAVENOUS
  Filled 2018-10-17: qty 100

## 2018-10-17 MED ORDER — PROPOFOL 1000 MG/100ML IV EMUL
5.0000 ug/kg/min | INTRAVENOUS | Status: DC
  Administered 2018-10-17: 35 ug/kg/min via INTRAVENOUS
  Administered 2018-10-17: 20 ug/kg/min via INTRAVENOUS
  Administered 2018-10-17: 10 ug/kg/min via INTRAVENOUS
  Administered 2018-10-18 (×2): 30 ug/kg/min via INTRAVENOUS
  Administered 2018-10-18: 25 ug/kg/min via INTRAVENOUS
  Filled 2018-10-17 (×6): qty 100

## 2018-10-17 MED ORDER — CLONIDINE HCL 0.1 MG PO TABS
0.2000 mg | ORAL_TABLET | Freq: Three times a day (TID) | ORAL | Status: DC
  Administered 2018-10-17 – 2018-10-18 (×3): 0.2 mg
  Filled 2018-10-17 (×3): qty 2

## 2018-10-17 MED ORDER — IPRATROPIUM-ALBUTEROL 0.5-2.5 (3) MG/3ML IN SOLN
3.0000 mL | Freq: Four times a day (QID) | RESPIRATORY_TRACT | Status: DC
  Administered 2018-10-17 – 2018-10-21 (×13): 3 mL via RESPIRATORY_TRACT
  Filled 2018-10-17 (×13): qty 3

## 2018-10-17 NOTE — Care Plan (Signed)
Tried to wean off MV. Patient not following any commands takes shallow breaths inadequate for extubation.  This has occurred multiple times since attempting to liberate patient. I have discussed this with sister [name] and have asked her to come in so she can help calm patient down during breathing trial with decreased sedation to allow accurate measurements of weaning parameters. We will attempt again today with family at bedside.    [name], M.D.  Pulmonary & [place]
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
## 2018-10-17 NOTE — Progress Notes (Signed)
Patient significant other [name] states patient cpap has been broken. Patient wore it back in may 4 days. The machine broke and he has not worn it since. Patient has been confused for weeks. Patient has been talking to his dead parents per [place].

## 2018-10-17 NOTE — Progress Notes (Signed)
Pharmacy Electrolyte Monitoring Consult:  Pharmacy consulted to assist in monitoring and replacing electrolytes in this 63 y.o. male admitted on 10/13/2018. Patient admitted with acute respiratory failure and CHF exacerbation. Patient is requiring mechanical ventilation.    Labs:  Sodium (mmol/L)  Date Value  10/17/2018 146 (H)  04/15/2014 140   Potassium (mmol/L)  Date Value  10/17/2018 3.5  04/15/2014 3.9   Magnesium (mg/dL)  Date Value  10/17/2018 1.9  08/21/2012 1.9   Phosphorus (mg/dL)  Date Value  10/16/2018 3.5   Calcium (mg/dL)  Date Value  10/17/2018 8.2 (L)   Calcium, Total (mg/dL)  Date Value  04/15/2014 8.3 (L)   Albumin (g/dL)  Date Value  10/15/2018 2.7 (L)  04/15/2014 2.9 (L)   Corrected Calcium: 9.2  Assessment/Plan: Electrolytes: Patient receiving furosemide 40mg  IV Q12hr.   Potassium 42mEq VT BID x 2 doses. Magnesium 1g IV X 1.   Will continue to replace via tube in setting of CHF and diuresis.   Will replace for goal potassium ~4 and goal magnesium ~ 2.   Constipation: Continue Senna/Docusate 2 tabs VT BID. Continue Miralax daily.   Pharmacy will continue to monitor and adjust per consult.   Gladyse Corvin L 10/17/2018 3:48 PM

## 2018-10-17 NOTE — Progress Notes (Signed)
[facility] at [name] NAME: Gary [name]    MR#:  [id number]  DATE OF BIRTH:  12-20-1955  SUBJECTIVE:  CHIEF COMPLAINT:   Chief Complaint  Patient presents with   Shortness of Breath   Patient currently sedated on the vent.  REVIEW OF SYSTEMS:  ROS unobtainable due to patient being on the vent.  DRUG ALLERGIES:  No Known Allergies VITALS:  Blood pressure (!) 138/98, pulse 71, temperature 98 F (36.7 C), temperature source Axillary, resp. rate 18, height 6\' 3"  (1.905 m), weight (!) 160 kg, SpO2 98 %. PHYSICAL EXAMINATION:   Physical Exam  Constitutional: No distress.  Patient sedated on the vent.  HENT:  Head: Normocephalic and atraumatic.  Eyes: Pupils are equal, round, and reactive to light. Conjunctivae are normal. Right eye exhibits no discharge.  Neck: Neck supple. No thyromegaly present.  Cardiovascular: Normal rate, regular rhythm and normal heart sounds.  Respiratory: Effort normal. He has rales.  GI: Soft. Bowel sounds are normal.  Central adiposity  Musculoskeletal:        General: Edema present.     Comments: Venous stasis changes on both lower extremity.  Neurological:  Sedated on the vent  Skin: Skin is warm. No erythema.  Psychiatric:  Sedated on the vent   LABORATORY PANEL:  Male CBC Recent Labs  Lab 10/17/18 0625  WBC 5.0  HGB 15.8  HCT 59.9*  PLT 110*   ------------------------------------------------------------------------------------------------------------------ Chemistries  Recent Labs  Lab 10/17/18 0625  NA 146*  K 3.5  CL 107  CO2 31  GLUCOSE 126*  BUN 51*  CREATININE 1.45*  CALCIUM 8.2*  MG 1.9   RADIOLOGY:  Dg Chest Port 1 View  Result Date: 10/17/2018 CLINICAL DATA:  Acute respiratory failure. EXAM: PORTABLE CHEST 1 VIEW COMPARISON:  Chest x-ray from yesterday. FINDINGS: Unchanged endotracheal and enteric tubes. Stable cardiomegaly and moderate pulmonary edema. Unchanged  small bilateral pleural effusions with bibasilar opacities. No pneumothorax. No acute osseous abnormality. IMPRESSION: 1. Unchanged moderate pulmonary edema and small bilateral pleural effusions. 2. Unchanged bibasilar opacities, favor atelectasis. Electronically Signed   By: Titus Dubin M.D.   On: 10/17/2018 06:49   ASSESSMENT AND PLAN:   1. Acute hypoxic and hypercapnic respiratory failure Secondary to CHF exacerbation.  Initially required BiPAP.  Patient subsequently intubated and sedated on the vent.  Being managed in the ICU.  2. Acute on chronic combined systolic and diastolic CHF (congestive heart failure) ([name])  Continue diuresis with IV Lasix. Cardiology following  3.  HTN (hypertension) - Blood pressure controlled.  Home dose antihypertensives  4.  Sleep apnea -patient now intubated.  5.  Acute on chronic thrombocytopenia Platelet count stable at 110 No bleeding.  Monitor closely.  6.  Acute kidney injury Patient being managed by ICU team.  Renal function improving  7.  Morbid obesity with BMI of 49.21. To encourage lifestyle modifications and weight loss after patient is extubated  DVT prophylaxis; heparin  All the records are reviewed and case discussed with Care Management/Social Worker.  CODE STATUS: Full Code  TOTAL TIME TAKING CARE OF THIS PATIENT: 33 minutes.   More than 50% of the time was spent in counseling/coordination of care: YES  POSSIBLE D/C IN 3 DAYS, DEPENDING ON CLINICAL CONDITION.   Donnamaria Shands M.D on 10/17/2018 at 2:51 PM  Between 7am to 6pm - Pager - [phone]  After 6pm go to [email or web address] - [facility]  Office  [phone]  CC: Primary care physician; Center, [facility]  Note: This dictation was prepared with Dragon dictation along with smaller phrase technology. Any transcriptional errors that result from this process are unintentional.

## 2018-10-18 LAB — PHOSPHORUS: Phosphorus: 3.8 mg/dL (ref 2.5–4.6)

## 2018-10-18 LAB — GLUCOSE, CAPILLARY
Glucose-Capillary: 100 mg/dL — ABNORMAL HIGH (ref 70–99)
Glucose-Capillary: 114 mg/dL — ABNORMAL HIGH (ref 70–99)
Glucose-Capillary: 143 mg/dL — ABNORMAL HIGH (ref 70–99)
Glucose-Capillary: 117 mg/dL — ABNORMAL HIGH (ref 70–99)
Glucose-Capillary: 97 mg/dL (ref 70–99)

## 2018-10-18 LAB — BASIC METABOLIC PANEL
Anion gap: 11 (ref 5–15)
BUN: 55 mg/dL — ABNORMAL HIGH (ref 8–23)
CO2: 29 mmol/L (ref 22–32)
Calcium: 8.4 mg/dL — ABNORMAL LOW (ref 8.9–10.3)
Chloride: 106 mmol/L (ref 98–111)
Creatinine, Ser: 1.43 mg/dL — ABNORMAL HIGH (ref 0.61–1.24)
GFR calc Af Amer: >60 mL/min (ref >60)
GFR calc non Af Amer: 52 mL/min — ABNORMAL LOW (ref >60)
Glucose, Bld: 106 mg/dL — ABNORMAL HIGH (ref 70–99)
Potassium: 4.2 mmol/L (ref 3.5–5.1)
Sodium: 146 mmol/L — ABNORMAL HIGH (ref 135–145)

## 2018-10-18 LAB — TRIGLYCERIDES: Triglycerides: 151 mg/dL — ABNORMAL HIGH (ref <150)

## 2018-10-18 LAB — MAGNESIUM: Magnesium: 2.2 mg/dL (ref 1.7–2.4)

## 2018-10-18 MED ORDER — ASPIRIN 81 MG PO CHEW
81.0000 mg | CHEWABLE_TABLET | Freq: Every day | ORAL | Status: DC
  Administered 2018-10-19 – 2018-10-22 (×4): 81 mg via ORAL
  Filled 2018-10-18 (×4): qty 1

## 2018-10-18 MED ORDER — SIMVASTATIN 10 MG PO TABS
10.0000 mg | ORAL_TABLET | Freq: Every day | ORAL | Status: DC
  Administered 2018-10-18 – 2018-10-21 (×4): 10 mg via ORAL
  Filled 2018-10-18 (×6): qty 1

## 2018-10-18 MED ORDER — FAMOTIDINE 20 MG PO TABS
20.0000 mg | ORAL_TABLET | Freq: Two times a day (BID) | ORAL | Status: DC
  Administered 2018-10-18 – 2018-10-22 (×8): 20 mg via ORAL
  Filled 2018-10-18 (×8): qty 1

## 2018-10-18 MED ORDER — CLONIDINE HCL 0.1 MG PO TABS
0.2000 mg | ORAL_TABLET | Freq: Three times a day (TID) | ORAL | Status: DC
  Administered 2018-10-18 – 2018-10-20 (×6): 0.2 mg via ORAL
  Filled 2018-10-18 (×6): qty 2

## 2018-10-18 MED ORDER — OXYCODONE HCL 5 MG PO TABS: 5.0000 mg | ORAL_TABLET | Freq: Three times a day (TID) | ORAL | Status: DC

## 2018-10-18 MED ORDER — SENNOSIDES-DOCUSATE SODIUM 8.6-50 MG PO TABS
2.0000 | ORAL_TABLET | Freq: Two times a day (BID) | ORAL | Status: DC
  Administered 2018-10-19 – 2018-10-22 (×2): 2 via ORAL
  Filled 2018-10-18 (×5): qty 2

## 2018-10-18 MED ORDER — POLYETHYLENE GLYCOL 3350 17 G PO PACK
17.0000 g | PACK | Freq: Every day | ORAL | Status: DC
  Administered 2018-10-20 – 2018-10-22 (×2): 17 g via ORAL
  Filled 2018-10-18 (×3): qty 1

## 2018-10-18 NOTE — Progress Notes (Signed)
Cuff leak noted. Patient extubated @0910  and placed on HFNC 50lpm @50 %.  Patient tolerated well.

## 2018-10-18 NOTE — Progress Notes (Signed)
Extubated at 39 - Dr Lanney Gins and pt's sister at bedside.  Pt has been agitated since 0900.  Family aware of risk that patient may have to be reintubated if he does not breathe well. He is aggitated enough to knoe for sure.

## 2018-10-18 NOTE — Progress Notes (Signed)
Pharmacy Electrolyte Monitoring Consult:  Pharmacy consulted to assist in monitoring and replacing electrolytes in this 63 y.o. male admitted on 10/13/2018. Patient admitted with acute respiratory failure and CHF exacerbation. Patient is requiring mechanical ventilation.    Labs:  Sodium (mmol/L)  Date Value  10/18/2018 146 (H)  04/15/2014 140   Potassium (mmol/L)  Date Value  10/18/2018 4.2  04/15/2014 3.9   Magnesium (mg/dL)  Date Value  10/18/2018 2.2  08/21/2012 1.9   Phosphorus (mg/dL)  Date Value  10/18/2018 3.8   Calcium (mg/dL)  Date Value  10/18/2018 8.4 (L)   Calcium, Total (mg/dL)  Date Value  04/15/2014 8.3 (L)   Albumin (g/dL)  Date Value  10/15/2018 2.7 (L)  04/15/2014 2.9 (L)   Corrected Calcium: 9.4 mg/dL  Assessment/Plan:  Electrolytes:   Patient receiving furosemide 40mg  IV Q12hr since 6/30   No further electrolyte replacement required today  Will continue to replace via tube in setting of CHF and diuresis.   Will replace for goal potassium ~4 and goal magnesium ~ 2.   Constipation:   Patient has a documented bowel movement 7/4  D/C Senna/Docusate and Miralax   Pharmacy will continue to monitor and adjust per consult.   Dallie Piles, PharmD 10/18/2018 9:55 AM

## 2018-10-18 NOTE — Plan of Care (Signed)
Pt extubated this morning. Tolerating water, alert and oriented. vss

## 2018-10-18 NOTE — Progress Notes (Signed)
   [facility] at [place] NAME: Gary [name]    MR#:  [id number]  DATE OF BIRTH:  02-23-1956  SUBJECTIVE:  CHIEF COMPLAINT:   Chief Complaint  Patient presents with  . Shortness of Breath   Patient currently sedated on the vent this morning.  Intensivist was at bedside with plans for extubation to high flow this morning.  Patient sister was at bedside.  REVIEW OF SYSTEMS:  ROS unobtainable due to patient being on the vent.  DRUG ALLERGIES:  No Known Allergies VITALS:  Blood pressure 121/82, pulse 89, temperature (!) 97.5 F (36.4 C), temperature source Axillary, resp. rate (!) 26, height 6\' 3"  (1.905 m), weight (!) 159 kg, SpO2 100 %. PHYSICAL EXAMINATION:   Physical Exam  Constitutional: No distress.  Patient sedated on the vent.  HENT:  Head: Normocephalic and atraumatic.  Eyes: Pupils are equal, round, and reactive to light. Conjunctivae are normal. Right eye exhibits no discharge.  Neck: Neck supple. No thyromegaly present.  Cardiovascular: Normal rate, regular rhythm and normal heart sounds.  Respiratory: Effort normal. He has rales.  GI: Soft. Bowel sounds are normal.  Central adiposity  Musculoskeletal:        General: Edema present.     Comments: Venous stasis changes on both lower extremity.  Neurological:  Sedated on the vent  Skin: Skin is warm. No erythema.  Psychiatric:  Sedated on the vent   LABORATORY PANEL:  Male CBC Recent Labs  Lab 10/17/18 0625  WBC 5.0  HGB 15.8  HCT 59.9*  PLT 110*   ------------------------------------------------------------------------------------------------------------------ Chemistries  Recent Labs  Lab 10/18/18 0452  NA 146*  K 4.2  CL 106  CO2 29  GLUCOSE 106*  BUN 55*  CREATININE 1.43*  CALCIUM 8.4*  MG 2.2   RADIOLOGY:  No results found. ASSESSMENT AND PLAN:   1. Acute hypoxic and hypercapnic respiratory failure Secondary to CHF exacerbation.  Initially  required BiPAP.  Patient subsequently intubated and sedated on the vent.  Being managed in the ICU.  2. Acute on chronic combined systolic and diastolic CHF (congestive heart failure) ([name])  Continue diuresis with IV Lasix. Cardiology following  3.  HTN (hypertension) - Blood pressure controlled.  Home dose antihypertensives  4.  Sleep apnea -patient now intubated.  5.  Acute on chronic thrombocytopenia Platelet count stable at 110 No bleeding.  Monitor closely.  6.  Acute kidney injury Patient being managed by ICU team.  Renal function improving  7.  Morbid obesity with BMI of 49.21. To encourage lifestyle modifications and weight loss after patient is extubated  DVT prophylaxis; heparin  All the records are reviewed and case discussed with Care Management/Social Worker.  CODE STATUS: Full Code  TOTAL TIME TAKING CARE OF THIS PATIENT: 34 minutes.   More than 50% of the time was spent in counseling/coordination of care: YES  POSSIBLE D/C IN 2-3 DAYS, DEPENDING ON CLINICAL CONDITION.   Gary [name] M.D on 10/18/2018 at 12:39 PM  Between 7am to 6pm - Pager - [phone]  After 6pm go to [email or web address] - [employer] [place] [facility]  Office  [phone]  CC: Primary care physician; Center, [facility]  Note: This dictation was prepared with Dragon dictation along with smaller phrase technology. Any transcriptional errors that result from this process are unintentional.
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
## 2018-10-19 LAB — BASIC METABOLIC PANEL
Anion gap: 6 (ref 5–15)
BUN: 49 mg/dL — ABNORMAL HIGH (ref 8–23)
CO2: 35 mmol/L — ABNORMAL HIGH (ref 22–32)
Calcium: 8 mg/dL — ABNORMAL LOW (ref 8.9–10.3)
Chloride: 106 mmol/L (ref 98–111)
Creatinine, Ser: 1.14 mg/dL (ref 0.61–1.24)
GFR calc Af Amer: >60 mL/min (ref >60)
GFR calc non Af Amer: >60 mL/min (ref >60)
Glucose, Bld: 101 mg/dL — ABNORMAL HIGH (ref 70–99)
Potassium: 3.4 mmol/L — ABNORMAL LOW (ref 3.5–5.1)
Sodium: 147 mmol/L — ABNORMAL HIGH (ref 135–145)

## 2018-10-19 LAB — RENAL FUNCTION PANEL
Albumin: 2.7 g/dL — ABNORMAL LOW (ref 3.5–5.0)
Anion gap: 5 (ref 5–15)
BUN: 43 mg/dL — ABNORMAL HIGH (ref 8–23)
CO2: 36 mmol/L — ABNORMAL HIGH (ref 22–32)
Calcium: 8 mg/dL — ABNORMAL LOW (ref 8.9–10.3)
Chloride: 105 mmol/L (ref 98–111)
Creatinine, Ser: 1.12 mg/dL (ref 0.61–1.24)
GFR calc Af Amer: >60 mL/min (ref >60)
GFR calc non Af Amer: >60 mL/min (ref >60)
Glucose, Bld: 101 mg/dL — ABNORMAL HIGH (ref 70–99)
Phosphorus: 3.9 mg/dL (ref 2.5–4.6)
Potassium: 3.6 mmol/L (ref 3.5–5.1)
Sodium: 146 mmol/L — ABNORMAL HIGH (ref 135–145)

## 2018-10-19 LAB — GLUCOSE, CAPILLARY
Glucose-Capillary: 100 mg/dL — ABNORMAL HIGH (ref 70–99)
Glucose-Capillary: 74 mg/dL (ref 70–99)
Glucose-Capillary: 96 mg/dL (ref 70–99)

## 2018-10-19 LAB — CULTURE, RESPIRATORY W GRAM STAIN: Culture: NORMAL

## 2018-10-19 MED ORDER — POTASSIUM CHLORIDE CRYS ER 20 MEQ PO TBCR
40.0000 meq | EXTENDED_RELEASE_TABLET | Freq: Two times a day (BID) | ORAL | Status: DC
  Administered 2018-10-19 – 2018-10-21 (×5): 40 meq via ORAL
  Filled 2018-10-19 (×5): qty 2

## 2018-10-19 MED ORDER — POTASSIUM CHLORIDE 20 MEQ PO PACK
40.0000 meq | PACK | Freq: Two times a day (BID) | ORAL | Status: DC
  Administered 2018-10-19: 40 meq
  Filled 2018-10-19: qty 2

## 2018-10-19 MED ORDER — ENSURE ENLIVE PO LIQD
237.0000 mL | Freq: Three times a day (TID) | ORAL | Status: DC
  Administered 2018-10-20 – 2018-10-22 (×4): 237 mL via ORAL

## 2018-10-19 NOTE — Progress Notes (Signed)
Pharmacy Electrolyte Monitoring Consult:  Pharmacy consulted to assist in monitoring and replacing electrolytes in this 63 y.o. male admitted on 10/13/2018. Patient admitted with acute respiratory failure and CHF exacerbation. Patient is requiring mechanical ventilation.    Labs:  Sodium (mmol/L)  Date Value  10/19/2018 147 (H)  04/15/2014 140   Potassium (mmol/L)  Date Value  10/19/2018 3.4 (L)  04/15/2014 3.9   Magnesium (mg/dL)  Date Value  10/18/2018 2.2  08/21/2012 1.9   Phosphorus (mg/dL)  Date Value  10/18/2018 3.8   Calcium (mg/dL)  Date Value  10/19/2018 8.0 (L)   Calcium, Total (mg/dL)  Date Value  04/15/2014 8.3 (L)   Albumin (g/dL)  Date Value  10/15/2018 2.7 (L)  04/15/2014 2.9 (L)   Corrected Calcium: 9.04 mg/dL  Assessment/Plan:  Electrolytes:   Patient receiving furosemide 40mg  IV Q12hr since 6/30   Mild hypernatremia noted, which seems to be relatively stable  weaned off MV currently on HHFNC  Will order 40 mEq oral KCl x 2 doses  Will continue to replace via tube in setting of CHF and diuresis.   Will replace for goal potassium ~4 mmol/L, magnesium ~ 2 mg/dL and phosphorous > 2.5 mg/dL  Constipation:   Patient has a documented bowel movement 7/4  Dr Lanney Gins would like to continue Senna/Docusate and Miralax   Pharmacy will continue to monitor and adjust per consult.   Dallie Piles, PharmD 10/19/2018 8:33 AM

## 2018-10-19 NOTE — Progress Notes (Addendum)
Updates given to [facility] via telephone and then patient spoke with wife. Denies shortness of breath.

## 2018-10-19 NOTE — Progress Notes (Signed)
[place] [facility] [place] NAME: Gary [name]    MR#:  [id number]  DATE OF BIRTH:  31-Jul-1955  SUBJECTIVE:  CHIEF COMPLAINT:   Chief Complaint  Patient presents with  . Shortness of Breath   Patient initially admitted to the ICU with acute respiratory failure secondary to CHF exacerbation Patient was extubated on 10/18/2018.  Sitting up in bed this morning and having breakfast.  No new complaints.  Only reported he would like podiatrist to cut his toenails at some point.  I informed patient this can be done as outpatient after resolution of his acute medical issues.  REVIEW OF SYSTEMS:  Review of Systems  Constitutional: Negative for chills and fever.  HENT: Negative for hearing loss and tinnitus.   Eyes: Negative for blurred vision and double vision.  Respiratory: Negative for cough and hemoptysis.   Cardiovascular: Negative for chest pain and palpitations.  Gastrointestinal: Negative for heartburn and nausea.  Genitourinary: Negative for dysuria and urgency.  Musculoskeletal: Negative for myalgias and neck pain.  Skin: Negative for itching and rash.  Neurological: Negative for dizziness and headaches.  Psychiatric/Behavioral: Negative for depression and hallucinations.     DRUG ALLERGIES:  No Known Allergies VITALS:  Blood pressure 105/74, pulse 81, temperature 98.4 F (36.9 C), temperature source Axillary, resp. rate (!) 25, height 6\' 3"  (1.905 m), weight (!) 159 kg, SpO2 99 %. PHYSICAL EXAMINATION:   Physical Exam  Constitutional: He is oriented to person, place, and time. He appears well-developed.  Morbidly obese  HENT:  Head: Normocephalic and atraumatic.  Right Ear: External ear normal.  Eyes: Pupils are equal, round, and reactive to light. Conjunctivae are normal.  Neck: Normal range of motion. Neck supple. No thyromegaly present.  Cardiovascular: Normal rate, regular rhythm and normal heart sounds.  No murmur heard.  Respiratory: Effort normal and breath sounds normal. No respiratory distress.  GI: Soft. Bowel sounds are normal. He exhibits no distension.  Musculoskeletal: Normal range of motion.        General: Edema present.  Neurological: He is alert and oriented to person, place, and time. No cranial nerve deficit.  Skin: Skin is warm. He is not diaphoretic. No erythema.  Chronic venous stasis changes on both lower extremities.  Psychiatric: He has a normal mood and affect. His behavior is normal.   LABORATORY PANEL:  Male CBC Recent Labs  Lab 10/17/18 0625  WBC 5.0  HGB 15.8  HCT 59.9*  PLT 110*   ------------------------------------------------------------------------------------------------------------------ Chemistries  Recent Labs  Lab 10/18/18 0452 10/19/18 0455  NA 146* 147*  K 4.2 3.4*  CL 106 106  CO2 29 35*  GLUCOSE 106* 101*  BUN 55* 49*  CREATININE 1.43* 1.14  CALCIUM 8.4* 8.0*  MG 2.2  --    RADIOLOGY:  No results found. ASSESSMENT AND PLAN:   1. Acute hypoxic and hypercapnic respiratory failure Secondary to CHF exacerbation.  Initially required BiPAP.  Patient subsequently intubated  Patient was managed in the ICU by intensivist.  Extubated on 10/18/2018  2. Acute on chronic diastolic CHF (congestive heart failure) ([name])  Continue diuresis with IV Lasix. Cardiology following. Last 2D echocardiogram in chart from February 2020 with ejection fraction of 60 to 65% with findings of impaired relaxation.  3.  HTN (hypertension) - Blood pressure controlled.  Home dose antihypertensives  4.  Sleep apnea -patient now extubated.  CPAP at night  5.  Acute on chronic thrombocytopenia Platelet count stable at 110  recently.  Follow-up on CBC in a.m. No bleeding.  Monitor closely.  6.  Acute kidney injury Renal function improved with creatinine of 1.14   7.  Morbid obesity with BMI of 49.21. To encourage lifestyle modifications and weight loss after patient is  extubated  DVT prophylaxis; heparin  All the records are reviewed and case discussed with Care Management/Social Worker. Anticipate transfer out of ICU later today. We will get physical therapy to evaluate and treat  CODE STATUS: Full Code  TOTAL TIME TAKING CARE OF THIS PATIENT: 35 minutes.   More than 50% of the time was spent in counseling/coordination of care: YES  POSSIBLE D/C IN 2-3 DAYS, DEPENDING ON CLINICAL CONDITION.   Permelia Bamba M.D on 10/19/2018 at 11:23 AM  Between 7am to 6pm - Pager - [phone]  After 6pm go to [email or web address] - Technical brewer [facility]  Office  [phone]  CC: Primary care physician; Center, [facility]  Note: This dictation was prepared with Dragon dictation along with smaller phrase technology. Any transcriptional errors that result from this process are unintentional.
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
## 2018-10-19 NOTE — Progress Notes (Signed)
Patient has home CPAP unit at bedside, however, home unit is missing power cords, mask, or humidity tank. Patient informed of missing pieces and home unit placed back in home storage bag. Patient informed a hospital owned unit will be provided for length of stay.

## 2018-10-19 NOTE — Progress Notes (Signed)
Patient arrived from ICU without telemetry. Dr Stark Jock requested that telemetry be resumed and order given to d/c CGG's. (patient is not diabetic)

## 2018-10-19 NOTE — Progress Notes (Signed)
Patient arrived from ICU. Alert and talking

## 2018-10-19 NOTE — Evaluation (Signed)
Physical Therapy Evaluation Patient Details Name: Gary [name] MRN: [id number] DOB: 08-26-1955 Today's Date: 10/19/2018   History of Present Illness  63 y/o male who came in with respiratory failure needing intubation.  Extubated 7/4.  Clinical Impression  Pt somewhat pensive to do a lot with PT, but ultimately showed great effort and was able to participate relatively well with PT.  He needed assist with transitions to/from supine, showed good effort and execution with transfers sit to stand and was actually able to take very small and limited side steps along EOB but did not have any overt LOBs or majory safety concerns.  Pt on 2 L on arrival with sats in the low 90s, with supine activities and in sitting his O2 stayed near 90%, it did drop to mid/low 80s with standing activity and nursing bumped him to 4 L and sats did increase back to 90s.  Pt eager to work with PT while here in the hospital and hopes to do well enough to d/c home, realizes that STR is the only safe option at this point.    Follow Up Recommendations SNF(pt still hoping to be able to go home per improvement)    Equipment Recommendations  Rolling walker with 5" wheels(bariatric)    Recommendations for Other Services       Precautions / Restrictions Precautions Precautions: Fall Restrictions Weight Bearing Restrictions: No      Mobility  Bed Mobility Overal bed mobility: Needs Assistance Bed Mobility: Supine to Sit;Sit to Supine     Supine to sit: Min assist Sit to supine: Mod assist   General bed mobility comments: Pt showd good effort getting to and from sitting.  Needed assist with LEs during both transitions, unable to lift LEs   Transfers Overall transfer level: Needs assistance Equipment used: Rolling walker (2 wheeled) Transfers: Sit to/from Stand Sit to Stand: Min assist;+2 physical assistance         General transfer comment: Pt needed cuing to insure appropriate UE use and positioning but was  able to assist getting to standing with less assist than anticipated  Ambulation/Gait Ambulation/Gait assistance: Min assist Gait Distance (Feet): 2 Feet Assistive device: Rolling walker (2 wheeled)       General Gait Details: Small side stepping with heavy walker use along EOB, able to maintain balance relatively well with walker and occasional need of LEs back on bed.  Stairs            Wheelchair Mobility    Modified Rankin (Stroke Patients Only)       Balance Overall balance assessment: Needs assistance Sitting-balance support: No upper extremity supported Sitting balance-[name] Scale: Good     Standing balance support: Bilateral upper extremity supported Standing balance-[name] Scale: Fair Standing balance comment: able to maintain standing posture, at times needing back of LEs on bed                             Pertinent Vitals/Pain Pain Assessment: No/denies pain    Home Living Family/patient expects to be discharged to:: Private residence Living Arrangements: Spouse/significant other Available Help at Discharge: Family Type of Home: Apartment Home Access: Level entry       Home Equipment: Cane - single point      Prior Function Level of Independence: Independent         Comments: Pt normally able to do all he needs w/o assist     Hand Dominance  Extremity/Trunk Assessment   Upper Extremity Assessment Upper Extremity Assessment: Generalized weakness(Chronic R shld issues, grossly 3+/5)    Lower Extremity Assessment Lower Extremity Assessment: Generalized weakness(unable to SLR, grossly 3+/5 t/o)       Communication   Communication: No difficulties  Cognition Arousal/Alertness: Awake/alert Behavior During Therapy: WFL for tasks assessed/performed Overall Cognitive Status: Within Functional Limits for tasks assessed                                        General Comments      Exercises      Assessment/Plan    PT Assessment Patient needs continued PT services  PT Problem List Decreased strength;Decreased range of motion;Decreased activity tolerance;Decreased balance;Decreased mobility;Decreased coordination;Decreased safety awareness;Decreased knowledge of use of DME;Decreased knowledge of precautions       PT Treatment Interventions Gait training;DME instruction;Functional mobility training;Therapeutic activities;Therapeutic exercise;Balance training;Neuromuscular re-education;Patient/family education    PT Goals (Current goals can be found in the Care Plan section)  Acute Rehab PT Goals Patient Stated Goal: go home PT Goal Formulation: With patient Time For Goal Achievement: 11/02/18 Potential to Achieve Goals: Good    Frequency Min 2X/week   Barriers to discharge        Co-evaluation               AM-PAC PT "6 Clicks" Mobility  Outcome Measure Help needed turning from your back to your side while in a flat bed without using bedrails?: A Little Help needed moving from lying on your back to sitting on the side of a flat bed without using bedrails?: A Lot Help needed moving to and from a bed to a chair (including a wheelchair)?: A Lot Help needed standing up from a chair using your arms (e.g., wheelchair or bedside chair)?: A Little Help needed to walk in hospital room?: A Lot Help needed climbing 3-5 steps with a railing? : Total 6 Click Score: 13    End of Session Equipment Utilized During Treatment: Gait belt;Oxygen(2 then 4 L (nursing in room t/o session)) Activity Tolerance: Patient limited by fatigue Patient left: in bed;with call bell/phone within reach;with nursing/sitter in room Nurse Communication: Mobility status PT Visit Diagnosis: Muscle weakness (generalized) (M62.81);Difficulty in walking, not elsewhere classified (R26.2)    Time: 4782-9562 PT Time Calculation (min) (ACUTE ONLY): 46 min   Charges:   PT Evaluation $PT Eval Low  Complexity: 1 Low PT Treatments $Therapeutic Activity: 23-37 mins        Kreg Shropshire, DPT 10/19/2018, 5:49 PM

## 2018-10-19 NOTE — Progress Notes (Signed)
Nutrition Follow-up  RD working remotely.  DOCUMENTATION CODES:   Morbid obesity  INTERVENTION:  Recommend changing diet to 2 gram sodium. Patient was ordered for 1.2 L fluid restriction this morning.  Provide Ensure Enlive po TID, each supplement provides 350 kcal and 20 grams of protein.   Encouraged adequate intake at meals.  Will discontinue liquid MVI as that was part of tube feed regimen.  NUTRITION DIAGNOSIS:   Inadequate oral intake related to decreased appetite as evidenced by per patient/family report.  Ongoing.  GOAL:   Patient will meet greater than or equal to 90% of their needs  Progressing.  MONITOR:   PO intake, Supplement acceptance, Labs, Weight trends, I & O's  REASON FOR ASSESSMENT:   Ventilator    ASSESSMENT:   63 year old male with PMHx of HTN, multiple gastric ulcers, CHF, sleep apnea, hx partial colectomy admitted with acute respiratory failure due to pulmonary edema requiring intubation on 6/30, also with acute renal failure.  Patient was extubated yesterday morning. Diet was initially advanced to heart healthy, but then changed this morning to renal/carbohydrate modified with 1.2 L fluid restriction. Spoke with patient over the phone. He reports his appetite is decreased. He has not had anything to eat yet today. He reports he has not been hungry and "needs to lose weight." He reports his appetite was also decreased PTA due to all the fluid accumulation. He reports he is going to order some fruit and gelatin tonight for dinner. He is also amenable to drinking ONS to help meet calorie/protein needs.  Medications reviewed and include: famotidine, Lasix 40 mg Q12hrs IV, liquid MVI daily per tube, Miralax 17 grams daily, potassium chloride 40 mEq BID, senna-docusate 2 tablets BID.  Labs reviewed: CBG 74-100, Sodium 146, CO2 36, BUN 43.  I/O: 2930 mL UOP yesterday (0.8 mL/kg/hr)  Weight trend: 159 kg on 7/4; trending down from 178.6 kg on 6/30  from diuresis  Diet Order:   Diet Order            Diet renal/carb modified with fluid restriction Diet-HS Snack? Nothing; Fluid restriction: 1200 mL Fluid; Room service appropriate? Yes; Fluid consistency: Thin  Diet effective now             EDUCATION NEEDS:   No education needs have been identified at this time  Skin:  Skin Assessment: Skin Integrity Issues:(weeping to bilateral legs)  Last BM:  10/18/2018 - medium type 6  Height:   Ht Readings from Last 1 Encounters:  10/13/18 6\' 3"  (1.905 m)   Weight:   Wt Readings from Last 1 Encounters:  10/18/18 (!) 159 kg   Ideal Body Weight:  89.1 kg  BMI:  Body mass index is 43.81 kg/m.  Estimated Nutritional Needs:   Kcal:  6256-3893 (11-14 kcal/kg)  Protein:  223 grams (2.5 grams/kg IBW)  Fluid:  2.2-2.6 L/day  Willey Blade, MS, RD, LDN Office: [phone] Pager: [phone] After Hours/Weekend Pager: [phone]

## 2018-10-20 DIAGNOSIS — J9602 Acute respiratory failure with hypercapnia: Secondary | ICD-10-CM

## 2018-10-20 LAB — CBC
HCT: 54.3 % — ABNORMAL HIGH (ref 39.0–52.0)
Hemoglobin: 13.5 g/dL (ref 13.0–17.0)
MCH: 21.1 pg — ABNORMAL LOW (ref 26.0–34.0)
MCHC: 24.9 g/dL — ABNORMAL LOW (ref 30.0–36.0)
MCV: 84.8 fL (ref 80.0–100.0)
Platelets: 102 10*3/uL — ABNORMAL LOW (ref 150–400)
RBC: 6.4 MIL/uL — ABNORMAL HIGH (ref 4.22–5.81)
RDW: 25.2 % — ABNORMAL HIGH (ref 11.5–15.5)
WBC: 3 10*3/uL — ABNORMAL LOW (ref 4.0–10.5)
nRBC: 0.7 % — ABNORMAL HIGH (ref 0.0–0.2)

## 2018-10-20 LAB — BASIC METABOLIC PANEL
Anion gap: 5 (ref 5–15)
BUN: 35 mg/dL — ABNORMAL HIGH (ref 8–23)
CO2: 37 mmol/L — ABNORMAL HIGH (ref 22–32)
Calcium: 8.2 mg/dL — ABNORMAL LOW (ref 8.9–10.3)
Chloride: 102 mmol/L (ref 98–111)
Creatinine, Ser: 0.99 mg/dL (ref 0.61–1.24)
GFR calc Af Amer: >60 mL/min (ref >60)
GFR calc non Af Amer: >60 mL/min (ref >60)
Glucose, Bld: 87 mg/dL (ref 70–99)
Potassium: 3.7 mmol/L (ref 3.5–5.1)
Sodium: 144 mmol/L (ref 135–145)

## 2018-10-20 LAB — MAGNESIUM: Magnesium: 1.9 mg/dL (ref 1.7–2.4)

## 2018-10-20 LAB — PHOSPHORUS: Phosphorus: 3 mg/dL (ref 2.5–4.6)

## 2018-10-20 MED ORDER — LOSARTAN POTASSIUM 25 MG PO TABS
25.0000 mg | ORAL_TABLET | Freq: Every day | ORAL | Status: DC
  Administered 2018-10-20: 25 mg via ORAL
  Filled 2018-10-20 (×2): qty 1

## 2018-10-20 MED ORDER — CARVEDILOL 6.25 MG PO TABS
3.1250 mg | ORAL_TABLET | Freq: Two times a day (BID) | ORAL | Status: DC
  Administered 2018-10-20 – 2018-10-21 (×2): 3.125 mg via ORAL
  Filled 2018-10-20 (×3): qty 1

## 2018-10-20 MED ORDER — CLONIDINE HCL 0.1 MG PO TABS
0.1000 mg | ORAL_TABLET | Freq: Two times a day (BID) | ORAL | Status: DC
  Administered 2018-10-20: 0.1 mg via ORAL
  Filled 2018-10-20 (×2): qty 1

## 2018-10-20 NOTE — NC FL2 (Signed)
[place] LEVEL OF CARE SCREENING TOOL     IDENTIFICATION  Patient Name: Gary [name] Birthdate: 1955/09/06 Sex: male Admission Date (Current Location): 10/13/2018  [place] and [place] Number:  Engineering geologist and Address:  [facility], [address], [place], [place] [address]      Provider Number: [id number]  Attending Physician Name and Address:  Vaughan Basta, *  Relative Name and Phone Number:       Current Level of Care: Hospital Recommended Level of Care: [place] Prior Approval Number:    Date Approved/Denied:   PASRR Number:    Discharge Plan: SNF    Current Diagnoses: Patient Active Problem List   Diagnosis Date Noted  . Acute respiratory failure with hypercapnia ([place]) 10/14/2018  . Acute on chronic combined systolic and diastolic CHF (congestive heart failure) ([place]) 05/28/2018  . Acute on chronic respiratory failure ([place]) 05/28/2018  . Tobacco use 05/04/2017  . Lymphedema 05/04/2017  . CHF (congestive heart failure) ([place]) 04/12/2017  . Chronic diastolic heart failure ([place]) 02/28/2015  . Sleep apnea 02/28/2015  . Acute on chronic respiratory failure with hypoxia ([place])   . Polysubstance abuse ([place])   . Morbid obesity ([place]) 04/01/2014  . HTN (hypertension) 04/01/2014  . Multiple rib fractures 03/27/2014    Orientation RESPIRATION BLADDER Height & Weight     Self, Place  Normal, O2(3 liters) Continent Weight: (!) 350 lb 8.5 oz (159 kg) Height:  6\' 3"  (190.5 cm)  BEHAVIORAL SYMPTOMS/MOOD NEUROLOGICAL BOWEL NUTRITION STATUS  (none) (none) Continent Diet  AMBULATORY STATUS COMMUNICATION OF NEEDS Skin   Extensive Assist Verbally Normal                       Personal Care Assistance Level of Assistance  Dressing, Bathing Bathing Assistance: Maximum assistance   Dressing Assistance: Maximum assistance     Functional Limitations Info             SPECIAL CARE FACTORS  FREQUENCY  PT (By licensed PT)                    Contractures Contractures Info: Not present    Additional Factors Info                  Current Medications (10/20/2018):  This is the current hospital active medication list Current Facility-Administered Medications  Medication Dose Route Frequency Provider Last Rate Last Dose  . acetaminophen (TYLENOL) tablet 650 mg  650 mg Oral Q6H PRN Lance Coon, MD   650 mg at 10/17/18 0000   Or  . acetaminophen (TYLENOL) suppository 650 mg  650 mg Rectal Q6H PRN Lance Coon, MD      . aspirin chewable tablet 81 mg  81 mg Oral Daily [name], MD   81 mg at 10/20/18 0923  . carvedilol (COREG) tablet 3.125 mg  3.125 mg Oral BID WC Jake Bathe, FNP      . cloNIDine (CATAPRES) tablet 0.1 mg  0.1 mg Oral BID Jake Bathe, FNP      . famotidine (PEPCID) tablet 20 mg  20 mg Oral BID [name], MD   20 mg at 10/20/18 0923  . feeding supplement (ENSURE ENLIVE) (ENSURE ENLIVE) liquid 237 mL  237 mL Oral TID BM Ojie, Jude, MD   237 mL at 10/20/18 0923  . fentaNYL (SUBLIMAZE) injection 25-50 mcg  25-50 mcg Intravenous Q2H PRN Charlett Nose, [facility]  50 mcg at 10/17/18 2158  . furosemide (LASIX) injection 40 mg  40 mg Intravenous Q12H Awilda Bill, NP   40 mg at 10/20/18 0768  . heparin injection 5,000 Units  5,000 Units Subcutaneous Driscilla Moats, MD   5,000 Units at 10/20/18 1417  . hydrALAZINE (APRESOLINE) injection 10 mg  10 mg Intravenous Q6H PRN Darel Hong D, NP      . ipratropium-albuterol (DUONEB) 0.5-2.5 (3) MG/3ML nebulizer solution 3 mL  3 mL Nebulization Q6H Tukov-Yual, Magdalene S, NP   3 mL at 10/20/18 1323  . losartan (COZAAR) tablet 25 mg  25 mg Oral Daily Jake Bathe, FNP   25 mg at 10/20/18 1417  . midazolam (VERSED) injection 2 mg  2 mg Intravenous Once Darel Hong D, NP      . midazolam (VERSED) injection 2 mg  2 mg Intravenous Q2H PRN Bradly Bienenstock, NP   2 mg at 10/18/18  0719  . ondansetron (ZOFRAN) tablet 4 mg  4 mg Oral Q6H PRN Lance Coon, MD       Or  . ondansetron [facility]) injection 4 mg  4 mg Intravenous Q6H PRN Lance Coon, MD      . polyethylene glycol (MIRALAX / GLYCOLAX) packet 17 g  17 g Oral Daily [name], MD   17 g at 10/20/18 0923  . potassium chloride SA (K-DUR) CR tablet 40 mEq  40 mEq Oral BID [name], MD   40 mEq at 10/20/18 0923  . senna-docusate (Senokot-S) tablet 2 tablet  2 tablet Oral BID [name], MD   2 tablet at 10/19/18 2146  . simvastatin (ZOCOR) tablet 10 mg  10 mg Oral Daily [name], MD   10 mg at 10/19/18 1800  . sodium chloride flush (NS) 0.9 % injection 10-40 mL  10-40 mL Intracatheter Q12H Darel Hong D, NP   10 mL at 10/19/18 2151  . sodium chloride flush (NS) 0.9 % injection 10-40 mL  10-40 mL Intracatheter PRN Bradly Bienenstock, NP         Discharge Medications: Please see discharge summary for a list of discharge medications.  Relevant Imaging Results:  Relevant Lab Results:   Additional Information [id number]  Shela Leff, LCSW

## 2018-10-20 NOTE — Progress Notes (Signed)
Patient off of CPAP at this time, on 3L [place]. Patient states he does not want to wear CPAP remainder of night. Will continue to monitor.

## 2018-10-20 NOTE — TOC Progression Note (Signed)
Transition of Care [facility]) - Progression Note    Patient Details  Name: Gary [name] MRN: [id number] Date of Birth: 1956/03/26  Transition of Care [facility]) CM/SW Contact  Shela Leff, [place] Phone Number: 10/20/2018, 3:36 PM  Clinical Narrative:   CSW spoke with patient this afternoon regarding PT recommendations for short term facility rehab. Patient's payor source is medicaid. CSW explained that he may have to go out of county and he would need to give up his medicaid check to the facility for payment. Patient stated he was not going to go out of county. CSW discussed the concern regarding his inability to care for himself now and his requiring a lot of assistance. Patient believes he can get assistance but is willing for CSW to send his referral out in the event a bed offer can be made locally.     Expected Discharge Plan: [place] Barriers to Discharge: No SNF bed  Expected Discharge Plan and Services Expected Discharge Plan: [place]                                               Social Determinants of Health (SDOH) Interventions    Readmission Risk Interventions No flowsheet data found.

## 2018-10-20 NOTE — Progress Notes (Signed)
Physical Therapy Treatment Patient Details Name: Gary [name] MRN: [id number] DOB: [birth year] Today's Date: 10/20/2018    History of Present Illness 63 y/o male who came in with respiratory failure needing intubation.  Extubated 7/4.    PT Comments    Pt resting in bed upon PT arrival with nasal cannula removed.  Pt's O2 sats noted to be 82% on room air so nasal cannula placed back on (3L O2) and pt's O2 sats improved to 92%.  Pt perseverating on someone coming to pick up his money during session and also perseverating on his weakness; extra time required to redirect pt and a nurse stopped in to address pt's money concerns.  Pt unable to stand 1st attempt at standing from bed and able to stand 2nd trial with mod assist x2 (pt requiring B UE support on walker with B LE's pushing against bed to maintain standing for 1-2 seconds before B knees buckling requiring 2 assist to safely sit back onto edge of bed).  Pt reporting he was able to get to the chair this morning but pt reports he had a really strong staff member who assisted him (per pt's nurse transfer was challenging).  Attempted sitting ex's but pt unable to maintain sitting posture (pt leaning backwards onto bed) so deferred sitting ex's.  Pt eventually agreeable to laying down in bed (mod assist for B LE's).  Will continue to focus on strengthening and progressive functional mobility as appropriate.   Follow Up Recommendations  SNF(pt still hoping to go home)     Equipment Recommendations  Rolling walker with 5" wheels;3in1 (PT);Wheelchair (measurements PT);Wheelchair cushion (measurements PT)(bariatric)    Recommendations for Other Services       Precautions / Restrictions Precautions Precautions: Fall Restrictions Weight Bearing Restrictions: No    Mobility  Bed Mobility Overal bed mobility: Needs Assistance Bed Mobility: Supine to Sit;Sit to Supine     Supine to sit: Supervision;HOB elevated Sit to supine: Mod assist    General bed mobility comments: increased effort/time for pt to perform on own semi-supine to sit; assist for B LE's sit to semi-supine; pt able to use B UE's on bedrails to scoot up in bed with bed flat  Transfers Overall transfer level: Needs assistance Equipment used: Rolling walker (2 wheeled)(bariatric) Transfers: Sit to/from Stand Sit to Stand: Mod assist;+2 physical assistance         General transfer comment: pt unable to stand 1st trial standing with walker; mod assist x2 to stand up to walker 2nd trial with B knees blocked  Ambulation/Gait             General Gait Details: Deferred d/t pt unable to stand long enough to attempt   Stairs             Wheelchair Mobility    Modified Rankin (Stroke Patients Only)       Balance Overall balance assessment: Needs assistance Sitting-balance support: No upper extremity supported;Feet supported Sitting balance-[name] Scale: Good Sitting balance - Comments: steady sitting reaching within BOS   Standing balance support: Bilateral upper extremity supported Standing balance-[name] Scale: Poor Standing balance comment: pt requiring B UE support on walker with B LE's pushing against bed to maintain standing for 1-2 seconds before B knees buckling requiring 2 assist to safely sit back onto edge of bed                            Cognition Arousal/Alertness:  Awake/alert Behavior During Therapy: (perseverating on his friend coming to pick up his money and being weak) Overall Cognitive Status: No family/caregiver present to determine baseline cognitive functioning                                        Exercises      General Comments   Nursing cleared pt for participation in physical therapy.  Pt agreeable to PT session.      Pertinent Vitals/Pain Pain Assessment: No/denies pain  Vitals (HR and O2 on 3 L via nasal cannula) stable and WFL throughout treatment session.    Home Living                       Prior Function            PT Goals (current goals can now be found in the care plan section) Acute Rehab PT Goals Patient Stated Goal: go home PT Goal Formulation: With patient Time For Goal Achievement: 11/02/18 Potential to Achieve Goals: Fair Progress towards PT goals: Progressing toward goals    Frequency    Min 2X/week      PT Plan Current plan remains appropriate    Co-evaluation              AM-PAC PT "6 Clicks" Mobility   Outcome Measure  Help needed turning from your back to your side while in a flat bed without using bedrails?: A Little Help needed moving from lying on your back to sitting on the side of a flat bed without using bedrails?: A Little Help needed moving to and from a bed to a chair (including a wheelchair)?: Total Help needed standing up from a chair using your arms (e.g., wheelchair or bedside chair)?: Total Help needed to walk in hospital room?: Total Help needed climbing 3-5 steps with a railing? : Total 6 Click Score: 10    End of Session Equipment Utilized During Treatment: Gait belt;Oxygen Activity Tolerance: Patient limited by fatigue Patient left: in bed;with call bell/phone within reach;with nursing/sitter in room(B heels elevated via pillows) Nurse Communication: Mobility status;Precautions;Other (comment)(NT and pt's nurse notified that therapist unable to set pt's bed alarm d/t bed alarm kept going off when set (even though pt was not moving); bed alarm was NOT on when therapist entered room beginning of session) PT Visit Diagnosis: Muscle weakness (generalized) (M62.81);Difficulty in walking, not elsewhere classified (R26.2)     Time: 7681-1572 PT Time Calculation (min) (ACUTE ONLY): 40 min  Charges:  $Therapeutic Activity: 38-52 mins                    Leitha Bleak, PT 10/20/18, 4:51 PM [phone]

## 2018-10-20 NOTE — Progress Notes (Addendum)
Pharmacy Electrolyte Monitoring Consult:  Pharmacy consulted to assist in monitoring and replacing electrolytes in this 63 y.o. male admitted on 10/13/2018. Patient admitted with acute respiratory failure and CHF exacerbation. Patient is requiring mechanical ventilation.    Labs:  Sodium (mmol/L)  Date Value  10/20/2018 144  04/15/2014 140   Potassium (mmol/L)  Date Value  10/20/2018 3.7  04/15/2014 3.9   Magnesium (mg/dL)  Date Value  10/20/2018 1.9  08/21/2012 1.9   Phosphorus (mg/dL)  Date Value  10/20/2018 3.0   Calcium (mg/dL)  Date Value  10/20/2018 8.2 (L)   Calcium, Total (mg/dL)  Date Value  04/15/2014 8.3 (L)   Albumin (g/dL)  Date Value  10/19/2018 2.7 (L)  04/15/2014 2.9 (L)   Corrected Calcium: 9.24 mg/dL  Assessment/Plan:   Patient receiving furosemide 40mg  IV Q12hr since 6/30   Mild hypernatremia seems to have corrected  weaned off MV currently on HHFNC  All electrolytes wnl, however, because he continues to be on scheduled Lasix I will schedule 40 mEq po KCl BID until Lasix is d/c  Because this consult was initiated in CCU pharmacy will sign off for now   Dallie Piles, PharmD 10/20/2018 7:10 AM

## 2018-10-20 NOTE — Progress Notes (Signed)
SUBJECTIVE: No chest pain, but feels weak and has shortness of breath with exertion.    Vitals:   10/19/18 1949 10/19/18 2235 10/20/18 0121 10/20/18 0317  BP: 115/82   105/71  Pulse: 81 76  79  Resp: 20 20  20   Temp: 98.6 F (37 C)   99.6 F (37.6 C)  TempSrc: Oral   Oral  SpO2: 99% 96% 95% 91%  Weight:      Height:        Intake/Output Summary (Last 24 hours) at 10/20/2018 0900 Last data filed at 10/20/2018 0716 Gross per 24 hour  Intake 960 ml  Output 825 ml  Net 135 ml    LABS: Basic Metabolic Panel: Recent Labs    10/18/18 0452  10/19/18 1230 10/20/18 0340  NA 146*   < > 146* 144  K 4.2   < > 3.6 3.7  CL 106   < > 105 102  CO2 29   < > 36* 37*  GLUCOSE 106*   < > 101* 87  BUN 55*   < > 43* 35*  CREATININE 1.43*   < > 1.12 0.99  CALCIUM 8.4*   < > 8.0* 8.2*  MG 2.2  --   --  1.9  PHOS 3.8  --  3.9 3.0   < > = values in this interval not displayed.   Liver Function Tests: Recent Labs    10/19/18 1230  ALBUMIN 2.7*   No results for input(s): LIPASE, AMYLASE in the last 72 hours. CBC: Recent Labs    10/20/18 0340  WBC 3.0*  HGB 13.5  HCT 54.3*  MCV 84.8  PLT 102*   Cardiac Enzymes: No results for input(s): CKTOTAL, CKMB, CKMBINDEX, TROPONINI in the last 72 hours. BNP: Invalid input(s): POCBNP D-Dimer: No results for input(s): DDIMER in the last 72 hours. Hemoglobin A1C: No results for input(s): HGBA1C in the last 72 hours. Fasting Lipid Panel: Recent Labs    10/18/18 0452  TRIG 151*   Thyroid Function Tests: No results for input(s): TSH, T4TOTAL, T3FREE, THYROIDAB in the last 72 hours.  Invalid input(s): FREET3 Anemia Panel: No results for input(s): VITAMINB12, FOLATE, FERRITIN, TIBC, IRON, RETICCTPCT in the last 72 hours.   PHYSICAL EXAM General: Well developed, well nourished, in no acute distress HEENT:  Normocephalic and atramatic Neck:  No JVD.  Lungs: Clear bilaterally to auscultation and percussion. Short of breath with  exertion. Heart: HRRR . Normal S1 and S2 without gallops or murmurs.  Abdomen: Bowel sounds are positive, abdomen soft and non-tender  Msk:  Back normal, normal gait. Normal strength and tone for age. Extremities: No clubbing, cyanosis or edema.   Neuro: Alert and oriented X 3. Psych:  Good affect, responds appropriately  TELEMETRY: [id number]  ASSESSMENT AND PLAN:  Acute on chronic diastolic congestive heart failure: Respiratory status is improving, blood pressure stable, creatinine is WNL. Advise adding losartan and coreg and titrate up as tolerated. Wean clonidine to 0.1mg  BID. Monitor fluid status, consider oral lasix in place of IV lasix. Will need close outpatient follow up with Dr. Humphrey Rolls when discharged.    Principal Problem:   Acute respiratory failure with hypercapnia ([name]) Active Problems:   HTN (hypertension)   Sleep apnea   Acute on chronic combined systolic and diastolic CHF (congestive heart failure) ([place])    Gary Bathe, NP-C 10/20/2018 9:00 AM Cell: [phone]

## 2018-10-20 NOTE — Progress Notes (Signed)
[place] [facility] [place] NAME: Gary [name]    MR#:  [id number]  DATE OF BIRTH:  [birth year]  SUBJECTIVE:  CHIEF COMPLAINT:   Chief Complaint  Patient presents with  . Shortness of Breath   Patient initially admitted to the ICU with acute respiratory failure secondary to CHF exacerbation Patient was extubated on 10/18/2018.  Sitting up in chair this morning and having breakfast.  No new complaints.  REVIEW OF SYSTEMS:  Review of Systems  Constitutional: Negative for chills and fever.  HENT: Negative for hearing loss and tinnitus.   Eyes: Negative for blurred vision and double vision.  Respiratory: Negative for cough and hemoptysis.   Cardiovascular: Negative for chest pain and palpitations.  Gastrointestinal: Negative for heartburn and nausea.  Genitourinary: Negative for dysuria and urgency.  Musculoskeletal: Negative for myalgias and neck pain.  Skin: Negative for itching and rash.  Neurological: Negative for dizziness and headaches.  Psychiatric/Behavioral: Negative for depression and hallucinations.     DRUG ALLERGIES:  No Known Allergies VITALS:  Blood pressure 113/86, pulse 85, temperature 98.7 F (37.1 C), temperature source Oral, resp. rate 20, height 6\' 3"  (1.905 m), weight (!) 159 kg, SpO2 91 %. PHYSICAL EXAMINATION:   Physical Exam  Constitutional: He is oriented to person, place, and time. He appears well-developed.  Morbidly obese  HENT:  Head: Normocephalic and atraumatic.  Right Ear: External ear normal.  Eyes: Pupils are equal, round, and reactive to light. Conjunctivae are normal.  Neck: Normal range of motion. Neck supple. No thyromegaly present.  Cardiovascular: Normal rate, regular rhythm and normal heart sounds.  No murmur heard. Respiratory: Effort normal and breath sounds normal. No respiratory distress.  GI: Soft. Bowel sounds are normal. He exhibits no distension.  Musculoskeletal: Normal range of  motion.        General: Edema present.  Neurological: He is alert and oriented to person, place, and time. No cranial nerve deficit.  Skin: Skin is warm. He is not diaphoretic. No erythema.  Chronic venous stasis changes on both lower extremities.  Psychiatric: He has a normal mood and affect. His behavior is normal.   LABORATORY PANEL:  Male CBC Recent Labs  Lab 10/20/18 0340  WBC 3.0*  HGB 13.5  HCT 54.3*  PLT 102*   ------------------------------------------------------------------------------------------------------------------ Chemistries  Recent Labs  Lab 10/20/18 0340  NA 144  K 3.7  CL 102  CO2 37*  GLUCOSE 87  BUN 35*  CREATININE 0.99  CALCIUM 8.2*  MG 1.9   RADIOLOGY:  No results found. ASSESSMENT AND PLAN:   1. Acute hypoxic and hypercapnic respiratory failure Secondary to CHF exacerbation.  Initially required BiPAP.  Patient subsequently intubated  Patient was managed in the ICU by intensivist.  Extubated on 10/18/2018  2. Acute on chronic diastolic CHF (congestive heart failure) ([name])  Continue diuresis with IV Lasix. Cardiology following. Last 2D echocardiogram in chart from February 2020 with ejection fraction of 60 to 65% with findings of impaired relaxation.  3.  HTN (hypertension) - Blood pressure controlled.  Home dose antihypertensives  4.  Sleep apnea -patient now extubated.  CPAP at night  5.  Acute on chronic thrombocytopenia Platelet count stable at 110 recently.  Follow-up on CBC in a.m. No bleeding.  Monitor closely.  6.  Acute kidney injury Renal function improved with creatinine of 1.14   7.  Morbid obesity with BMI of 49.21. To encourage lifestyle modifications and weight loss after patient is  extubated  DVT prophylaxis; heparin  All the records are reviewed and case discussed with Care Management/Social Worker. PT had suggested Rehab placement, pt is not sure about the decision.  CODE STATUS: Full Code  TOTAL TIME  TAKING CARE OF THIS PATIENT: 35 minutes.   More than 50% of the time was spent in counseling/coordination of care: YES  POSSIBLE D/C IN 2-3 DAYS, DEPENDING ON CLINICAL CONDITION.   Vaughan Basta M.D on 10/20/2018 at 4:08 PM  Between 7am to 6pm - Pager - [phone]  After 6pm go to [email or web address] - Technical brewer [facility]  Office  [phone]  CC: Primary care physician; Center, [facility]  Note: This dictation was prepared with Dragon dictation along with smaller phrase technology. Any transcriptional errors that result from this process are unintentional.

## 2018-10-20 NOTE — Progress Notes (Signed)
* [facility]     Assessment and Plan:  Acute on chronic hypercapnic respiratory failure Obesity hypoventilation syndrome with morbid obesity, BMI 43.8. Acute on chronic hypoxic respiratory failure secondary to acute diastolic congestive heart failure. Recurrent admissions due to heart failure and acute respiratory failure.  - I am uncertain as to what device the patient is currently on at home, but I would recommend that he be placed on BiPAP nightly while inpatient, and discharged with  BiPAP for home use. - Continue diuresis. - Patient is at high risk for continued episodes of respiratory failure, hospitalization, intubation, death due to chronic comorbidities.    Date: 10/20/2018  MRN# [id number] Gary [name] 1956-02-23   Gary [name] is a 63 y.o. old male seen in follow up for chief complaint of  Chief Complaint  Patient presents with  . Shortness of Breath     HPI:  The patient is a 63 year old male admitted from home by EMS on 10/13/2018 due to severe shortness of breath and progressive lower extremity edema.  He has had 3 previous admissions over the the last approximately 6 months with acute hypoxic respiratory failure and heart failure.  At the time of presentation is ABG showed 7.13/with PCO2 of 107.  Previous ABG on 06/27/2018 had showed 7.37 with PCO2 of 75.  His BNP was 1786, troponin XX 2, COVID-19 test negative Patient was placed on BiPAP admitted to stepdown, but appeared to be obtunded and had to be immediately intubated.  He was diuresed, initial attempts at weaning failed due to shallow breathing, subsequently extubated on 10/18/2018, transferred out of ICU on 7/5. He was restarted on his home CPAP on 3 L nasal cannula, however overnight he declined to use it for the part of the night. Labs as of today showed an elevated PCO2 on metabolic panel at 37, renal function has returned to baseline. Patient has no new complaints today.  He does have his  home Pap machine at the bedside, however does not have a plug so I cannot tell what kind of machine it is.  He tells me that he has not really been using his device at home.     Medication:    Current Facility-Administered Medications:  .  acetaminophen (TYLENOL) tablet 650 mg, 650 mg, Oral, Q6H PRN, 650 mg at 10/17/18 0000 **OR** acetaminophen (TYLENOL) suppository 650 mg, 650 mg, Rectal, Q6H PRN, Lance Coon, MD .  aspirin chewable tablet 81 mg, 81 mg, Oral, Daily, [name], MD, 81 mg at 10/20/18 0923 .  carvedilol (COREG) tablet 3.125 mg, 3.125 mg, Oral, BID WC, Jake Bathe, FNP .  cloNIDine (CATAPRES) tablet 0.1 mg, 0.1 mg, Oral, BID, Jake Bathe, FNP .  famotidine (PEPCID) tablet 20 mg, 20 mg, Oral, BID, Aleskerov, Fuad, MD, 20 mg at 10/20/18 0923 .  feeding supplement (ENSURE ENLIVE) (ENSURE ENLIVE) liquid 237 mL, 237 mL, Oral, TID BM, Ojie, Jude, MD, 237 mL at 10/20/18 0923 .  fentaNYL (SUBLIMAZE) injection 25-50 mcg, 25-50 mcg, Intravenous, Q2H PRN, Charlett Nose, RPH, 50 mcg at 10/17/18 2158 .  furosemide (LASIX) injection 40 mg, 40 mg, Intravenous, Q12H, Blakeney, Dreama Saa, NP, 40 mg at 10/20/18 3016 .  heparin injection 5,000 Units, 5,000 Units, Subcutaneous, Q8H, Lance Coon, MD, 5,000 Units at 10/20/18 0507 .  hydrALAZINE (APRESOLINE) injection 10 mg, 10 mg, Intravenous, Q6H PRN, Darel Hong D, NP .  ipratropium-albuterol (DUONEB) 0.5-2.5 (3) MG/3ML nebulizer solution 3 mL, 3 mL, Nebulization, Q6H, Tukov-Yual,  Arlyss Gandy, NP, 3 mL at 10/20/18 0744 .  losartan (COZAAR) tablet 25 mg, 25 mg, Oral, Daily, Jake Bathe, FNP .  midazolam (VERSED) injection 2 mg, 2 mg, Intravenous, Once, Darel Hong D, NP .  midazolam (VERSED) injection 2 mg, 2 mg, Intravenous, Q2H PRN, Darel Hong D, NP, 2 mg at 10/18/18 0719 .  ondansetron (ZOFRAN) tablet 4 mg, 4 mg, Oral, Q6H PRN **OR** ondansetron (ZOFRAN) injection 4 mg, 4 mg, Intravenous, Q6H PRN,  Lance Coon, MD .  polyethylene glycol (MIRALAX / GLYCOLAX) packet 17 g, 17 g, Oral, Daily, [name], MD, 17 g at 10/20/18 0923 .  potassium chloride SA (K-DUR) CR tablet 40 mEq, 40 mEq, Oral, BID, Lanney Gins, Fuad, MD, 40 mEq at 10/20/18 0923 .  senna-docusate (Senokot-S) tablet 2 tablet, 2 tablet, Oral, BID, [name], MD, 2 tablet at 10/19/18 2146 .  simvastatin (ZOCOR) tablet 10 mg, 10 mg, Oral, Daily, Lanney Gins, Fuad, MD, 10 mg at 10/19/18 1800 .  sodium chloride flush (NS) 0.9 % injection 10-40 mL, 10-40 mL, Intracatheter, Q12H, Darel Hong D, NP, 10 mL at 10/19/18 2151 .  sodium chloride flush (NS) 0.9 % injection 10-40 mL, 10-40 mL, Intracatheter, PRN, Bradly Bienenstock, NP   Allergies:  Patient has no known allergies.   Review of Systems:  Constitutional: Feels well. Cardiovascular: Denies chest pain, exertional chest pain.  Pulmonary: Denies hemoptysis, pleuritic chest pain.   The remainder of systems were reviewed and were found to be negative other than what is documented in the HPI.    Physical Examination:   VS: BP 113/86 (BP Location: Left Arm)   Pulse 85   Temp 98.7 F (37.1 C) (Oral)   Resp 20   Ht 6\' 3"  (1.905 m)   Wt (!) 159 kg   SpO2 91%   BMI 43.81 kg/m   General Appearance: No distress  Neuro:without focal findings, mental status, speech normal, alert and oriented HEENT: PERRLA, EOM intact Pulmonary: No wheezing, No rales  CardiovascularNormal S1,S2.  No m/r/g.  Abdomen: Benign, Soft, non-tender, No masses Renal:  No costovertebral tenderness  GU:  No performed at this time. Endoc: No evident thyromegaly, no signs of acromegaly or Cushing features Skin:   warm, no rashes, no ecchymosis  Extremities: normal, no cyanosis, clubbing.     LABORATORY PANEL:   CBC Recent Labs  Lab 10/20/18 0340  WBC 3.0*  HGB 13.5  HCT 54.3*  PLT 102*    ------------------------------------------------------------------------------------------------------------------  Chemistries  Recent Labs  Lab 10/20/18 0340  NA 144  K 3.7  CL 102  CO2 37*  GLUCOSE 87  BUN 35*  CREATININE 0.99  CALCIUM 8.2*  MG 1.9   ------------------------------------------------------------------------------------------------------------------  Cardiac Enzymes No results for input(s): TROPONINI in the last 168 hours. ------------------------------------------------------------  RADIOLOGY:   No results found for this or any previous visit. Results for orders placed during the hospital encounter of 06/24/18  DG Chest 2 View   Narrative CLINICAL DATA:  Initial evaluation for acute shortness of breath, history of CHF.  EXAM: CHEST - 2 VIEW  COMPARISON:  Prior radiograph from 05/29/2018.  FINDINGS: Moderate cardiomegaly, stable. Mediastinal silhouette within normal limits.  Lungs mildly hypoinflated. Diffuse pulmonary vascular congestion with interstitial prominence, suggesting pulmonary interstitial edema. Superimposed streaky bibasilar opacities favored to reflect atelectasis. No other focal infiltrates. Trace bilateral pleural effusions. No pneumothorax.  Osseous structures are unchanged.  IMPRESSION: 1. Cardiomegaly with mild diffuse pulmonary interstitial edema and probable trace bilateral pleural effusions. 2. Low  lung volumes with superimposed mild bibasilar atelectasis.   Electronically Signed   By: Jeannine Boga M.D.   On: 06/24/2018 17:49    ------------------------------------------------------------------------------------------------------------------  Thank  you for allowing [facility] to assist in the care of your patient. Our recommendations are noted above.  Please contact us if we can be of further service.   [name], M.D., F.C.C.P.  Board Certified in Internal Medicine,  Pulmonary Medicine, [place], and Sleep Medicine.  [facility] Office Number: [phone]  10/20/2018

## 2018-10-21 ENCOUNTER — Telehealth: Payer: Self-pay

## 2018-10-21 MED ORDER — IPRATROPIUM-ALBUTEROL 0.5-2.5 (3) MG/3ML IN SOLN: 3.0000 mL | Freq: Four times a day (QID) | RESPIRATORY_TRACT | Status: DC | PRN

## 2018-10-21 MED ORDER — FUROSEMIDE 40 MG PO TABS
40.0000 mg | ORAL_TABLET | Freq: Every day | ORAL | Status: DC
  Administered 2018-10-22: 40 mg via ORAL
  Filled 2018-10-21: qty 1

## 2018-10-21 NOTE — Progress Notes (Signed)
SUBJECTIVE: Alert, less SOB   Vitals:   10/21/18 0549 10/21/18 1046 10/21/18 1052 10/21/18 1357  BP: 93/62 (!) 87/59 108/70 115/79  Pulse: 88 86 84 86  Resp: 20   20  Temp: 98.9 F (37.2 C)   98.6 F (37 C)  TempSrc: Oral   Oral  SpO2: 97% 94% 92% 92%  Weight:      Height:        Intake/Output Summary (Last 24 hours) at 10/21/2018 1448 Last data filed at 10/21/2018 1300 Gross per 24 hour  Intake 120 ml  Output 575 ml  Net -455 ml    LABS: Basic Metabolic Panel: Recent Labs    10/19/18 1230 10/20/18 0340  NA 146* 144  K 3.6 3.7  CL 105 102  CO2 36* 37*  GLUCOSE 101* 87  BUN 43* 35*  CREATININE 1.12 0.99  CALCIUM 8.0* 8.2*  MG  --  1.9  PHOS 3.9 3.0   Liver Function Tests: Recent Labs    10/19/18 1230  ALBUMIN 2.7*   No results for input(s): LIPASE, AMYLASE in the last 72 hours. CBC: Recent Labs    10/20/18 0340  WBC 3.0*  HGB 13.5  HCT 54.3*  MCV 84.8  PLT 102*   Cardiac Enzymes: No results for input(s): CKTOTAL, CKMB, CKMBINDEX, TROPONINI in the last 72 hours. BNP: Invalid input(s): POCBNP D-Dimer: No results for input(s): DDIMER in the last 72 hours. Hemoglobin A1C: No results for input(s): HGBA1C in the last 72 hours. Fasting Lipid Panel: No results for input(s): CHOL, HDL, LDLCALC, TRIG, CHOLHDL, LDLDIRECT in the last 72 hours. Thyroid Function Tests: No results for input(s): TSH, T4TOTAL, T3FREE, THYROIDAB in the last 72 hours.  Invalid input(s): FREET3 Anemia Panel: No results for input(s): VITAMINB12, FOLATE, FERRITIN, TIBC, IRON, RETICCTPCT in the last 72 hours.   PHYSICAL EXAM General: Well developed, well nourished, in no acute distress HEENT:  Normocephalic and atramatic Neck:  No JVD.  Lungs: Clear bilaterally to auscultation and percussion. Heart: HRRR . Normal S1 and S2 without gallops or murmurs.  Abdomen: Bowel sounds are positive, abdomen soft and non-tender  Msk:  Back normal, normal gait. Normal strength and tone for  age. Extremities: No clubbing, cyanosis or edema.   Neuro: Alert and oriented X 3. Psych:  Good affect, responds appropriately  TELEMETRY: NSR  ASSESSMENT AND PLAN:CHF due to diastolic dysfunction, Advise f/u office 2 weeks  Principal Problem:   Acute respiratory failure with hypercapnia ([name]) Active Problems:   HTN (hypertension)   Sleep apnea   Acute on chronic combined systolic and diastolic CHF (congestive heart failure) ([place])    Neoma Laming A, MD, [facility] 10/21/2018 2:48 PM

## 2018-10-21 NOTE — Telephone Encounter (Signed)
Called pt to schedule 2wk follow up for OSA. Pt declined appt. At  this time. Stated that he is leaving for rehab tomorrow and will call to schedule when he gets out.

## 2018-10-21 NOTE — TOC Progression Note (Signed)
Transition of Care [facility]) - Progression Note    Patient Details  Name: Gary [name] MRN: [id number] Date of Birth: 03/17/1956  Transition of [facility]) CM/SW Contact  Shela Leff, [place] Phone Number: 10/21/2018, 2:39 PM  Clinical Narrative:   Patient has had a bed offer from [facility] [place]. Doug at [facility] is having their business office check to ensure he receives disability income. Patient states he receives about $522 per month. Patient may be able to transfer to [facility] tomorrow if everything is confirmed.     Expected Discharge Plan: [place] Barriers to Discharge: No SNF bed  Expected Discharge Plan and Services Expected Discharge Plan: [place]                                               Social Determinants of Health (SDOH) Interventions    Readmission Risk Interventions No flowsheet data found.

## 2018-10-21 NOTE — Progress Notes (Signed)
O2 sat 72%; found CPAP off patient's mouth; readjusted with 5L O2; encouraged patient to deep breathe; voiced understanding; O2 sat increased 10 99%/5LCPAP; Respiratory therapist called to room to assess CPAP, to be sure it is functioning properly and have facemask secured properly. Barbaraann Faster, RN 4:35 AM 10/21/2018

## 2018-10-21 NOTE — Progress Notes (Signed)
[place] at [place] NAME: Gary [name]    MR#:  [id number]  DATE OF BIRTH:  December 12, 1955  SUBJECTIVE:  CHIEF COMPLAINT:   Chief Complaint  Patient presents with  . Shortness of Breath   Patient initially admitted to the ICU with acute respiratory failure secondary to CHF exacerbation Patient was extubated on 10/18/2018.  Blood pressure was running on lower normal side today so blood pressure meds were held today.  REVIEW OF SYSTEMS:  Review of Systems  Constitutional: Negative for chills and fever.  HENT: Negative for hearing loss and tinnitus.   Eyes: Negative for blurred vision and double vision.  Respiratory: Negative for cough and hemoptysis.   Cardiovascular: Negative for chest pain and palpitations.  Gastrointestinal: Negative for heartburn and nausea.  Genitourinary: Negative for dysuria and urgency.  Musculoskeletal: Negative for myalgias and neck pain.  Skin: Negative for itching and rash.  Neurological: Negative for dizziness and headaches.  Psychiatric/Behavioral: Negative for depression and hallucinations.     DRUG ALLERGIES:  No Known Allergies VITALS:  Blood pressure 115/79, pulse 86, temperature 98.6 F (37 C), temperature source Oral, resp. rate 20, height 6\' 3"  (1.905 m), weight (!) 159 kg, SpO2 92 %. PHYSICAL EXAMINATION:   Physical Exam  Constitutional: He is oriented to person, place, and time. He appears well-developed.  Morbidly obese  HENT:  Head: Normocephalic and atraumatic.  Right Ear: External ear normal.  Eyes: Pupils are equal, round, and reactive to light. Conjunctivae are normal.  Neck: Normal range of motion. Neck supple. No thyromegaly present.  Cardiovascular: Normal rate, regular rhythm and normal heart sounds.  No murmur heard. Respiratory: Effort normal and breath sounds normal. No respiratory distress.  GI: Soft. Bowel sounds are normal. He exhibits no distension.  Musculoskeletal:  Normal range of motion.        General: Edema present.  Neurological: He is alert and oriented to person, place, and time. No cranial nerve deficit.  Skin: Skin is warm. He is not diaphoretic. No erythema.  Chronic venous stasis changes on both lower extremities.  Psychiatric: He has a normal mood and affect. His behavior is normal.   LABORATORY PANEL:  Male CBC Recent Labs  Lab 10/20/18 0340  WBC 3.0*  HGB 13.5  HCT 54.3*  PLT 102*   ------------------------------------------------------------------------------------------------------------------ Chemistries  Recent Labs  Lab 10/20/18 0340  NA 144  K 3.7  CL 102  CO2 37*  GLUCOSE 87  BUN 35*  CREATININE 0.99  CALCIUM 8.2*  MG 1.9   RADIOLOGY:  No results found. ASSESSMENT AND PLAN:   1. Acute hypoxic and hypercapnic respiratory failure Secondary to CHF exacerbation.  Initially required BiPAP.  Patient subsequently intubated  Patient was managed in the ICU by intensivist.  Extubated on 10/18/2018  2. Acute on chronic diastolic CHF (congestive heart failure) ([name])  Continue diuresis with IV Lasix. Cardiology following. Last 2D echocardiogram in chart from February 2020 with ejection fraction of 60 to 65% with findings of impaired relaxation.  3.  HTN (hypertension) -with borderline blood pressure today   Home dose antihypertensives were given initially but now as blood pressure is running lower normal will hold losartan and clonidine and only continue his low-dose carvedilol.  4.  Sleep apnea -patient now extubated.  CPAP at night.  5.  Acute on chronic thrombocytopenia Platelet count stable at 110 recently.  Follow-up on CBC in a.m. No bleeding.  Monitor closely.  6.  Acute kidney  injury Renal function improved with creatinine of 1.14   7.  Morbid obesity with BMI of 49.21. To encourage lifestyle modifications and weight loss after patient is extubated  DVT prophylaxis; heparin  All the records are  reviewed and case discussed with Care Management/Social Worker. PT had suggested Rehab placement, pt is not sure about the decision.  CODE STATUS: Full Code  TOTAL TIME TAKING CARE OF THIS PATIENT: 35 minutes.   More than 50% of the time was spent in counseling/coordination of care: YES  POSSIBLE D/C IN 2-3 DAYS, DEPENDING ON CLINICAL CONDITION.   Vaughan Basta M.D on 10/21/2018 at 2:56 PM  Between 7am to 6pm - Pager - [phone]  After 6pm go to [email or web address] - Technical brewer [place] [facility]  Office  [phone]  CC: Primary care physician; Center, [facility]  Note: This dictation was prepared with Dragon dictation along with smaller phrase technology. Any transcriptional errors that result from this process are unintentional.

## 2018-10-21 NOTE — Progress Notes (Signed)
BP 87/59, HR 86 left arm. Patient asymptomatic. Rechecked in right arm, BP 108/70, HR 84. Per Dr. Anselm Jungling hold BP medications at this time.   Fuller Mandril, RN

## 2018-10-22 LAB — BASIC METABOLIC PANEL
Anion gap: 6 (ref 5–15)
BUN: 32 mg/dL — ABNORMAL HIGH (ref 8–23)
CO2: 35 mmol/L — ABNORMAL HIGH (ref 22–32)
Calcium: 8.4 mg/dL — ABNORMAL LOW (ref 8.9–10.3)
Chloride: 104 mmol/L (ref 98–111)
Creatinine, Ser: 1.37 mg/dL — ABNORMAL HIGH (ref 0.61–1.24)
GFR calc Af Amer: >60 mL/min (ref >60)
GFR calc non Af Amer: 55 mL/min — ABNORMAL LOW (ref >60)
Glucose, Bld: 84 mg/dL (ref 70–99)
Potassium: 5 mmol/L (ref 3.5–5.1)
Sodium: 145 mmol/L (ref 135–145)

## 2018-10-22 LAB — GLUCOSE, CAPILLARY: Glucose-Capillary: 83 mg/dL (ref 70–99)

## 2018-10-22 LAB — SARS CORONAVIRUS 2 BY RT PCR (HOSPITAL ORDER, PERFORMED IN ~~LOC~~ HOSPITAL LAB): SARS Coronavirus 2: NEGATIVE

## 2018-10-22 MED ORDER — SENNOSIDES-DOCUSATE SODIUM 8.6-50 MG PO TABS: 2.0000 | ORAL_TABLET | Freq: Two times a day (BID) | ORAL | 0 refills | Status: DC

## 2018-10-22 MED ORDER — FUROSEMIDE 40 MG PO TABS: 40.0000 mg | ORAL_TABLET | Freq: Two times a day (BID) | ORAL | 0 refills | Status: DC

## 2018-10-22 MED ORDER — POTASSIUM CHLORIDE CRYS ER 20 MEQ PO TBCR: 20.0000 meq | EXTENDED_RELEASE_TABLET | Freq: Every day | ORAL | 3 refills | Status: DC

## 2018-10-22 MED ORDER — CARVEDILOL 3.125 MG PO TABS: 3.1250 mg | ORAL_TABLET | Freq: Two times a day (BID) | ORAL | 0 refills | Status: DC

## 2018-10-22 MED ORDER — FAMOTIDINE 20 MG PO TABS: 20.0000 mg | ORAL_TABLET | Freq: Two times a day (BID) | ORAL | 0 refills | Status: DC

## 2018-10-22 NOTE — TOC Progression Note (Signed)
Transition of Care [facility]) - Progression Note    Patient Details  Name: Gary [name] MRN: [id number] Date of Birth: 27-Apr-1955  Transition of Care [facility]) CM/SW Contact  Shela Leff, [place] Phone Number: 10/22/2018, 3:08 PM  Clinical Narrative:   Marden Noble at [facility] has formally made a bed offer and can take patient today. Patient's significant other: Ardyth Gal: [phone] is aware of discharge today. Discharge information sent to [facility]. Currently waiting on Covid test to return prior to patient discharging today.    Expected Discharge Plan: [place] Barriers to Discharge: No SNF bed  Expected Discharge Plan and Services Expected Discharge Plan: [place]         Expected Discharge Date: 10/22/18                                     Social Determinants of Health (SDOH) Interventions    Readmission Risk Interventions No flowsheet data found.

## 2018-10-22 NOTE — Plan of Care (Signed)
Care plan complete. Adequate for DC.

## 2018-10-22 NOTE — Progress Notes (Signed)
SUBJECTIVE: No chest pain, but feels weak and has shortness of breath with exertion.    Vitals:   10/21/18 1357 10/21/18 1643 10/22/18 0430 10/22/18 0840  BP: 115/79 96/64 107/72 105/71  Pulse: 86 85 90 91  Resp: 20  20   Temp: 98.6 F (37 C)  99.2 F (37.3 C)   TempSrc: Oral  Oral   SpO2: 92% 94% 93%   Weight:      Height:        Intake/Output Summary (Last 24 hours) at 10/22/2018 0924 Last data filed at 10/22/2018 0800 Gross per 24 hour  Intake 120 ml  Output 475 ml  Net -355 ml    LABS: Basic Metabolic Panel: Recent Labs    10/19/18 1230 10/20/18 0340 10/22/18 0511  NA 146* 144 145  K 3.6 3.7 5.0  CL 105 102 104  CO2 36* 37* 35*  GLUCOSE 101* 87 84  BUN 43* 35* 32*  CREATININE 1.12 0.99 1.37*  CALCIUM 8.0* 8.2* 8.4*  MG  --  1.9  --   PHOS 3.9 3.0  --    Liver Function Tests: Recent Labs    10/19/18 1230  ALBUMIN 2.7*   No results for input(s): LIPASE, AMYLASE in the last 72 hours. CBC: Recent Labs    10/20/18 0340  WBC 3.0*  HGB 13.5  HCT 54.3*  MCV 84.8  PLT 102*   Cardiac Enzymes: No results for input(s): CKTOTAL, CKMB, CKMBINDEX, TROPONINI in the last 72 hours. BNP: Invalid input(s): POCBNP D-Dimer: No results for input(s): DDIMER in the last 72 hours. Hemoglobin A1C: No results for input(s): HGBA1C in the last 72 hours. Fasting Lipid Panel: No results for input(s): CHOL, HDL, LDLCALC, TRIG, CHOLHDL, LDLDIRECT in the last 72 hours. Thyroid Function Tests: No results for input(s): TSH, T4TOTAL, T3FREE, THYROIDAB in the last 72 hours.  Invalid input(s): FREET3 Anemia Panel: No results for input(s): VITAMINB12, FOLATE, FERRITIN, TIBC, IRON, RETICCTPCT in the last 72 hours.   PHYSICAL EXAM General: Well developed, well nourished, in no acute distress HEENT:  Normocephalic and atramatic Neck:  No JVD.  Lungs: Clear bilaterally to auscultation and percussion. Short of breath with exertion. Heart: HRRR . Normal S1 and S2 without gallops  or murmurs.  Abdomen: Bowel sounds are positive, abdomen soft and non-tender  Msk:  Back normal, normal gait. Normal strength and tone for age. Extremities: No clubbing, cyanosis or edema.   Neuro: Alert and oriented X 3. Psych:  Good affect, responds appropriately  TELEMETRY: [id number]  ASSESSMENT AND PLAN:  Acute on chronic diastolic congestive heart failure: Respiratory status is improving, blood pressure low-normal, creatinine is borderline. Continue current medications and may discharge to rehab from cardiac perspective. Will need close outpatient follow up with Dr. Humphrey Rolls when discharged, follow up 2 weeks with [facility]    Principal Problem:   Acute respiratory failure with hypercapnia ([place]) Active Problems:   HTN (hypertension)   Sleep apnea   Acute on chronic combined systolic and diastolic CHF (congestive heart failure) ([place])    Jake Bathe, NP-C 10/22/2018 9:24 AM Cell: [phone]

## 2018-10-22 NOTE — Progress Notes (Signed)
Shift summary:  -Plan to discharge to SNF today. -Will remove CVC just prior to D/C , or if DC falls through, then will consult IV team for PIV placement. -Held Coreg this AM as BP is soft 100s/70s.
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
## 2018-10-22 NOTE — Progress Notes (Signed)
Patient transported off unit with [facility].

## 2018-10-22 NOTE — Progress Notes (Addendum)
Report given to [place], [place], at [facility]. [facility] called to request transport. Attempted to contact family to give update, no answer received.

## 2018-10-22 NOTE — Plan of Care (Signed)
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
## 2018-10-30 ENCOUNTER — Telehealth: Payer: Self-pay | Admitting: Family

## 2018-10-30 ENCOUNTER — Ambulatory Visit: Payer: Medicaid Other | Admitting: Family

## 2018-10-30 NOTE — Telephone Encounter (Signed)
Patient did not show for his Heart Failure Clinic appointment on 10/30/2018. Will attempt to reschedule.

## 2018-11-06 ENCOUNTER — Emergency Department
Admission: EM | Admit: 2018-11-06 | Discharge: 2018-11-07 | Disposition: A | Discharge status: Skilled Nursing Facility | Payer: Medicaid Other | Attending: Emergency Medicine | Admitting: Emergency Medicine

## 2018-11-06 ENCOUNTER — Other Ambulatory Visit: Payer: Self-pay

## 2018-11-06 ENCOUNTER — Emergency Department: Payer: Medicaid Other

## 2018-11-06 DIAGNOSIS — Z79899 Other long term (current) drug therapy: Secondary | ICD-10-CM | POA: Diagnosis not present

## 2018-11-06 DIAGNOSIS — Z7982 Long term (current) use of aspirin: Secondary | ICD-10-CM | POA: Diagnosis not present

## 2018-11-06 DIAGNOSIS — I5042 Chronic combined systolic (congestive) and diastolic (congestive) heart failure: Secondary | ICD-10-CM | POA: Diagnosis not present

## 2018-11-06 DIAGNOSIS — J9611 Chronic respiratory failure with hypoxia: Secondary | ICD-10-CM

## 2018-11-06 DIAGNOSIS — R531 Weakness: Secondary | ICD-10-CM | POA: Diagnosis not present

## 2018-11-06 DIAGNOSIS — F17210 Nicotine dependence, cigarettes, uncomplicated: Secondary | ICD-10-CM | POA: Insufficient documentation

## 2018-11-06 DIAGNOSIS — Z20828 Contact with and (suspected) exposure to other viral communicable diseases: Secondary | ICD-10-CM | POA: Diagnosis not present

## 2018-11-06 DIAGNOSIS — I110 Hypertensive heart disease with heart failure: Secondary | ICD-10-CM | POA: Diagnosis not present

## 2018-11-06 LAB — COMPREHENSIVE METABOLIC PANEL
ALT: 73 U/L — ABNORMAL HIGH (ref 0–44)
AST: 31 U/L (ref 15–41)
Albumin: 3.2 g/dL — ABNORMAL LOW (ref 3.5–5.0)
Alkaline Phosphatase: 64 U/L (ref 38–126)
Anion gap: 7 (ref 5–15)
BUN: 17 mg/dL (ref 8–23)
CO2: 34 mmol/L — ABNORMAL HIGH (ref 22–32)
Calcium: 8.3 mg/dL — ABNORMAL LOW (ref 8.9–10.3)
Chloride: 99 mmol/L (ref 98–111)
Creatinine, Ser: 1.02 mg/dL (ref 0.61–1.24)
GFR calc Af Amer: >60 mL/min (ref >60)
GFR calc non Af Amer: >60 mL/min (ref >60)
Glucose, Bld: 98 mg/dL (ref 70–99)
Potassium: 3.5 mmol/L (ref 3.5–5.1)
Sodium: 140 mmol/L (ref 135–145)
Total Bilirubin: 0.9 mg/dL (ref 0.3–1.2)
Total Protein: 7.2 g/dL (ref 6.5–8.1)

## 2018-11-06 LAB — CBC WITH DIFFERENTIAL/PLATELET
Abs Immature Granulocytes: 0.26 10*3/uL — ABNORMAL HIGH (ref 0.00–0.07)
Basophils Absolute: 0.1 10*3/uL (ref 0.0–0.1)
Basophils Relative: 1 %
Eosinophils Absolute: 0.1 10*3/uL (ref 0.0–0.5)
Eosinophils Relative: 1 %
HCT: 51.2 % (ref 39.0–52.0)
Hemoglobin: 13.5 g/dL (ref 13.0–17.0)
Immature Granulocytes: 2 %
Lymphocytes Relative: 21 %
Lymphs Abs: 2.4 10*3/uL (ref 0.7–4.0)
MCH: 22 pg — ABNORMAL LOW (ref 26.0–34.0)
MCHC: 26.4 g/dL — ABNORMAL LOW (ref 30.0–36.0)
MCV: 83.5 fL (ref 80.0–100.0)
Monocytes Absolute: 1.2 10*3/uL — ABNORMAL HIGH (ref 0.1–1.0)
Monocytes Relative: 10 %
Neutro Abs: 7.6 10*3/uL (ref 1.7–7.7)
Neutrophils Relative %: 65 %
Platelets: 178 10*3/uL (ref 150–400)
RBC: 6.13 MIL/uL — ABNORMAL HIGH (ref 4.22–5.81)
RDW: 24.2 % — ABNORMAL HIGH (ref 11.5–15.5)
Smear Review: NORMAL
WBC: 11.6 10*3/uL — ABNORMAL HIGH (ref 4.0–10.5)
nRBC: 5.9 % — ABNORMAL HIGH (ref 0.0–0.2)

## 2018-11-06 LAB — SARS CORONAVIRUS 2 BY RT PCR (HOSPITAL ORDER, PERFORMED IN ~~LOC~~ HOSPITAL LAB): SARS Coronavirus 2: NEGATIVE

## 2018-11-06 MED ORDER — POTASSIUM CHLORIDE CRYS ER 20 MEQ PO TBCR
20.0000 meq | EXTENDED_RELEASE_TABLET | Freq: Every day | ORAL | Status: DC
  Administered 2018-11-06 – 2018-11-07 (×2): 20 meq via ORAL
  Filled 2018-11-06 (×2): qty 1

## 2018-11-06 MED ORDER — SENNOSIDES-DOCUSATE SODIUM 8.6-50 MG PO TABS
2.0000 | ORAL_TABLET | Freq: Two times a day (BID) | ORAL | Status: DC
  Administered 2018-11-06 – 2018-11-07 (×2): 2 via ORAL
  Filled 2018-11-06 (×2): qty 2

## 2018-11-06 MED ORDER — FAMOTIDINE 20 MG PO TABS
20.0000 mg | ORAL_TABLET | Freq: Two times a day (BID) | ORAL | Status: DC
  Administered 2018-11-06 – 2018-11-07 (×2): 20 mg via ORAL
  Filled 2018-11-06 (×2): qty 1

## 2018-11-06 MED ORDER — ASPIRIN 81 MG PO CHEW
81.0000 mg | CHEWABLE_TABLET | Freq: Every day | ORAL | Status: DC
  Administered 2018-11-06 – 2018-11-07 (×2): 81 mg via ORAL
  Filled 2018-11-06 (×2): qty 1

## 2018-11-06 MED ORDER — SIMVASTATIN 10 MG PO TABS
10.0000 mg | ORAL_TABLET | Freq: Every day | ORAL | Status: DC
  Administered 2018-11-06 – 2018-11-07 (×2): 10 mg via ORAL
  Filled 2018-11-06 (×2): qty 1

## 2018-11-06 MED ORDER — CARVEDILOL 6.25 MG PO TABS
3.1250 mg | ORAL_TABLET | Freq: Two times a day (BID) | ORAL | Status: DC
  Administered 2018-11-06 – 2018-11-07 (×2): 3.125 mg via ORAL
  Filled 2018-11-06 (×2): qty 1

## 2018-11-06 MED ORDER — FUROSEMIDE 40 MG PO TABS
40.0000 mg | ORAL_TABLET | Freq: Two times a day (BID) | ORAL | Status: DC
  Administered 2018-11-06 – 2018-11-07 (×2): 40 mg via ORAL
  Filled 2018-11-06 (×2): qty 1
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
## 2018-11-06 NOTE — TOC Initial Note (Signed)
Transition of Care [facility]) - Initial/Assessment Note    Patient Details  Name: Gary [name] MRN: [id number] Date of Birth: [birth year]  Transition of Care [facility]) CM/SW Contact:    Fredric Mare, LCSW Phone Number: 11/06/2018, 2:53 PM  Clinical Narrative:                  Patient is a 63 year old male that presents to the ED for weakness. Patient reported that he would like to go to rehab because he is "unable to stand and unable to use his legs." Patient reports that he currently lives with his significant other, and she helps him a lot at home, but she has a hard time lifting him up.  Patient shared that he would need a walker and bedside commode at home.   Patient provided permission to start bed search.     Expected Discharge Plan: Skilled Nursing Facility Barriers to Discharge: Continued Medical Work up   Patient Goals and CMS Choice     Choice offered to / list presented to : Patient  Expected Discharge Plan and Services Expected Discharge Plan: [place]   Discharge Planning Services: CM Consult   Living arrangements for the past 2 months: Apartment(duplex apartment)                                      Prior Living Arrangements/Services Living arrangements for the past 2 months: Apartment(duplex apartment) Lives with:: Significant Other, Roommate Patient language and need for interpreter reviewed:: Yes Do you feel safe going back to the place where you live?: No   patient states that he feels too weak  Need for Family Participation in Patient Care: No (Comment) Care giver support system in place?: No (comment) Current home services: Home PT Criminal Activity/Legal Involvement Pertinent to Current Situation/Hospitalization: No - Comment as needed  Activities of Daily Living      Permission Sought/Granted Permission sought to share information with : Family Supports, Chartered certified accountant granted to share information  with : Yes, Verbal Permission Granted  Share Information with NAME: Ardyth Gal     Permission granted to share info w Relationship: Significant other  Permission granted to share info w Contact Information: [phone]  Emotional Assessment Appearance:: Appears stated age Attitude/Demeanor/Rapport: Engaged Affect (typically observed): Accepting, Calm Orientation: : Oriented to Self, Oriented to Place, Oriented to  Time, Oriented to Situation Alcohol / Substance Use: Tobacco Use Psych Involvement: No (comment)  Admission diagnosis:  leg pain Patient Active Problem List   Diagnosis Date Noted  . Acute respiratory failure with hypercapnia ([place]) 10/14/2018  . Acute on chronic combined systolic and diastolic CHF (congestive heart failure) ([place]) 05/28/2018  . Acute on chronic respiratory failure ([place]) 05/28/2018  . Tobacco use 05/04/2017  . Lymphedema 05/04/2017  . CHF (congestive heart failure) ([place]) 04/12/2017  . Chronic diastolic heart failure ([place]) 02/28/2015  . Sleep apnea 02/28/2015  . Acute on chronic respiratory failure with hypoxia ([place])   . Polysubstance abuse ([place])   . Morbid obesity ([place]) 04/01/2014  . HTN (hypertension) 04/01/2014  . Multiple rib fractures 03/27/2014   PCP:  Center, [place]:   [facility] [address], [place] [address] Phone: [phone] Fax: [phone]  Medication Mgmt. [place], [place] [address] [place]  [place] [address] Phone: [phone] Fax: [phone]  [place], [place] [address] Phone: [phone] Fax: [place], [place] [address] Phone: [phone] Fax: [phone]     Social Determinants of Health (SDOH) Interventions    Readmission  Risk Interventions No flowsheet data found.

## 2018-11-06 NOTE — ED Triage Notes (Signed)
Pt arrived via EMS from home - he reports that he was in [facility] on vent this month and in this hospital this month as well - pt arrives today c/o SHOB (noted O2 sat 86% on RA on EMS arrival) placed on 2L via n/c and improved to 95% - pt main complaint is that he needs "therapy" because he is unable to stand or use his legs

## 2018-11-06 NOTE — NC FL2 (Signed)
[place] LEVEL OF CARE SCREENING TOOL     IDENTIFICATION  Patient Name: Gary [name] Birthdate: [birth year] Sex: male Admission Date (Current Location): 11/06/2018  [place] and [place] Number:  Selena Lesser [id number] [place] and Address:  [facility], [address], [place], [place] [address]      Provider Number: [id number]  Attending Physician Name and Address:  Carrie Mew, MD  Relative Name and Phone Number:  Ardyth Gal    Significant other   [phone]    Current Level of Care: Hospital Recommended Level of Care: [place] Prior Approval Number:    Date Approved/Denied:   PASRR Number: [id number]  Discharge Plan: SNF    Current Diagnoses: Patient Active Problem List   Diagnosis Date Noted  . Acute respiratory failure with hypercapnia ([place]) 10/14/2018  . Acute on chronic combined systolic and diastolic CHF (congestive heart failure) ([place]) 05/28/2018  . Acute on chronic respiratory failure ([place]) 05/28/2018  . Tobacco use 05/04/2017  . Lymphedema 05/04/2017  . CHF (congestive heart failure) ([place]) 04/12/2017  . Chronic diastolic heart failure ([place]) 02/28/2015  . Sleep apnea 02/28/2015  . Acute on chronic respiratory failure with hypoxia ([place])   . Polysubstance abuse ([place])   . Morbid obesity ([place]) 04/01/2014  . HTN (hypertension) 04/01/2014  . Multiple rib fractures 03/27/2014    Orientation RESPIRATION BLADDER Height & Weight     Self, Situation, Time, Place  Normal Continent Weight: (!) 350 lb (158.8 kg) Height:  6\' 3"  (190.5 cm)  BEHAVIORAL SYMPTOMS/MOOD NEUROLOGICAL BOWEL NUTRITION STATUS      Continent Diet  AMBULATORY STATUS COMMUNICATION OF NEEDS Skin   Extensive Assist Verbally Normal                       Personal Care Assistance Level of Assistance  Bathing, Feeding, Dressing Bathing Assistance: Maximum assistance Feeding assistance: Limited assistance Dressing Assistance:  Maximum assistance     Functional Limitations Info  Sight, Hearing, Speech Sight Info: Adequate Hearing Info: Adequate Speech Info: Adequate    SPECIAL CARE FACTORS FREQUENCY  PT (By licensed PT), OT (By licensed OT)     PT Frequency: 5 times a week OT Frequency: 5 times a week            Contractures Contractures Info: Not present    Additional Factors Info  Code Status, Allergies Code Status Info: FULL Allergies Info: No known allergies           Current Medications (11/06/2018):  This is the current hospital active medication list No current facility-administered medications for this encounter.    Current Outpatient Medications  Medication Sig Dispense Refill  . aspirin 81 MG chewable tablet Chew 1 tablet (81 mg total) by mouth daily. 90 tablet 3  . carvedilol (COREG) 3.125 MG tablet Take 1 tablet (3.125 mg total) by mouth 2 (two) times daily with a meal. 60 tablet 0  . famotidine (PEPCID) 20 MG tablet Take 1 tablet (20 mg total) by mouth 2 (two) times daily. 60 tablet 0  . furosemide (LASIX) 40 MG tablet Take 1 tablet (40 mg total) by mouth 2 (two) times daily. 60 tablet 0  . potassium chloride SA (K-DUR) 20 MEQ tablet Take 1 tablet (20 mEq total) by mouth daily. 180 tablet 3  . senna-docusate (SENOKOT-S) 8.6-50 MG tablet Take 2 tablets by mouth 2 (two) times daily. 20 tablet 0  . simvastatin (ZOCOR) 10 MG tablet Take 1 tablet (10  mg total) by mouth daily. 90 tablet 3     Discharge Medications: Please see discharge summary for a list of discharge medications.  Relevant Imaging Results:  Relevant Lab Results:   Additional Information SSN:   [id number]     uses CPAP machine  Tania Ivi Griffith, LCSW
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
## 2018-11-06 NOTE — ED Notes (Signed)
Pt taken to [place] room for covid swab - ok'd by charge RN

## 2018-11-06 NOTE — ED Notes (Signed)
Assisted pt to room to use urinal. Removed pt wet shorts and underwear placed in personal belonging . Pt back in hallway. Warm blankets given

## 2018-11-06 NOTE — ED Notes (Signed)
Per SW pt has been accepted to facility and will be able to transfer tomorrow with signed FL2 and negative covid results
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
## 2018-11-06 NOTE — ED Notes (Signed)
Pt given sandwich tray with coke to drink Pt is aware that he will not be getting a room at this time per charge RN

## 2018-11-06 NOTE — TOC Progression Note (Addendum)
Transition of Care [facility]) - Progression Note    Patient Details  Name: Gary [name] MRN: [id number] Date of Birth: 1955-09-14  Transition [facility]) CM/SW Contact  Tania Juvia Aerts, LCSW Phone Number: 11/06/2018, 4:00 PM  Clinical Narrative:     4:00pm- CSW attempting to set up home health services for this patient. So far, no agencies are able to accept Medicaid patients at this time.  No SNF bed offers have been made.   4:28pm - [facility] offered a bed to patient. Patient accepted the bed. Dee at [facility] stated that patient will be able to go tomorrow morning (7/24) as long as she has all of the paperwork. CSW will send COVID results once the results are in.  EDP notified.  Patient's nurse notified.    Expected Discharge Plan: [place] Barriers to Discharge: Continued Medical Work up  Expected Discharge Plan and Services Expected Discharge Plan: [place]   Discharge Planning Services: CM Consult   Living arrangements for the past 2 months: Apartment(duplex apartment)                                       Social Determinants of Health (SDOH) Interventions    Readmission Risk Interventions No flowsheet data found.

## 2018-11-07 LAB — PATHOLOGIST SMEAR REVIEW

## 2018-11-07 MED ORDER — FUROSEMIDE 40 MG PO TABS: 80.0000 mg | ORAL_TABLET | Freq: Two times a day (BID) | ORAL | 0 refills | Status: DC

## 2018-11-07 MED ORDER — CARVEDILOL 3.125 MG PO TABS: 6.2500 mg | ORAL_TABLET | Freq: Two times a day (BID) | ORAL | 0 refills | Status: DC

## 2018-11-07 NOTE — ED Notes (Signed)
Pt  Assisted onto toilet at this time.

## 2018-11-07 NOTE — ED Provider Notes (Signed)
-----------------------------------------   7:07 AM on 11/07/2018 -----------------------------------------   Blood pressure 112/74, pulse 80, temperature 98 F (36.7 C), temperature source Oral, resp. rate 16, height 6\' 3"  (1.905 m), weight (!) 158.8 kg, SpO2 95 %.  The patient is calm and cooperative at this time.  There have been no acute events since the last update.  Awaiting disposition plan from Social Work team(s).   Paulette Blanch, MD 11/07/18 [phone]

## 2018-11-07 NOTE — Discharge Instructions (Signed)
You will need to follow up for outpatient sleep study for your CPAP instructions given you have not been using and do not know the settings.

## 2018-11-07 NOTE — ED Notes (Signed)
[facility] arrived to transport Pt.

## 2018-11-07 NOTE — TOC Progression Note (Addendum)
Transition of Care [facility]) - Progression Note    Patient Details  Name: Gary [name] MRN: [id number] Date of Birth: 1955-08-20  Transition of Care [facility]) CM/SW Contact  Marshell Garfinkel, RN Phone Number: 11/07/2018, 8:57 AM  Clinical Narrative:     RNCM spoke with [facility] at [place] [facility] [employer] [phone]. They are trying to verify his Medicaid. When that is complete she will call and be ready to take patient. Her nurse Levan Hurst may also have questions- I have provided my direct contact. Update at 0946: Spoke with [name] [phone] and [name] [phone]. [name] is bringing his clothes and will bring CPAP with settings for facility.  EDRN updated. Update at 1006: Medicaid verified report number [phone] [phone] ask for nurse for room 200 hall  Expected Discharge Plan: [place] Barriers to Discharge: Continued Medical Work up  Expected Discharge Plan and Services Expected Discharge Plan: [place]   Discharge Planning Services: CM Consult   Living arrangements for the past 2 months: Apartment(duplex apartment)                                       Social Determinants of Health (SDOH) Interventions    Readmission Risk Interventions No flowsheet data found.

## 2018-11-07 NOTE — ED Notes (Signed)
Pt sister dropped off 2 bags of pt belongings left in room with pt at this time

## 2018-11-07 NOTE — TOC Transition Note (Addendum)
Transition of Care [facility]) - CM/SW Discharge Note   Patient Details  Name: Gary [name] MRN: [id number] Date of Birth: [birth year]  Transition of Care [facility]) CM/SW Contact:  Marshell Garfinkel, RN Phone Number: 11/07/2018, 10:10 AM   Clinical Narrative:     Update at 27: Medicaid verified report number [phone] [phone] ask for nurse for room 200 hall. EDP and ED RN updated that SNF will need CPAP setting included on discharge. Update at 1031: patient does not know settings of CPAP and hasn't been using at home as it is missing parts.  Patient to follow up as outpatient. Update at 1045: request that AVS be faxed to [facility] RN with [facility] [phone]. Message sent to [facility] requesting [facility] transport when [facility] ED calls for pick.   Final next level of care: Skilled Nursing Facility Barriers to Discharge: No Barriers Identified   Patient Goals and CMS Choice     Choice offered to / list presented to : Patient, Sibling  Discharge Placement                       Discharge Plan and Services   Discharge Planning Services: CM Consult Post Acute Care Choice: [place]                               Social Determinants of Health (SDOH) Interventions     Readmission Risk Interventions No flowsheet data found.

## 2018-11-07 NOTE — ED Notes (Signed)
[facility]  CALLED  FOR  TRANSPORT 

## 2018-11-07 NOTE — ED Notes (Signed)
Pt requesting breakfast at this time. Diet order placed and dietary notified of orders. Breakfast tray to be sent up. Pt currently given sandwich tray and sitting on side of bed at this time. Call light within reach. Pt instructed not to attempt to get up without assistance from staff.

## 2018-11-07 NOTE — ED Provider Notes (Signed)
10:37 AM social work was able to arrange placement for patient.  Patient has not been using his CPAP.  Recommended to follow-up outpatient for sleep study.  Otherwise patient is stable for discharge at this time.    Vanessa Neopit, MD 11/07/18 1037

## 2018-11-14 ENCOUNTER — Ambulatory Visit: Payer: Medicaid Other | Admitting: Family
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
## 2018-12-05 ENCOUNTER — Telehealth: Payer: Self-pay | Admitting: Family

## 2018-12-05 ENCOUNTER — Ambulatory Visit: Payer: Medicaid Other | Admitting: Family

## 2018-12-05 NOTE — Telephone Encounter (Signed)
Patient did not show for his Heart Failure [facility] appointment on 12/05/2018. Will attempt to reschedule.
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
## 2019-01-02 ENCOUNTER — Other Ambulatory Visit: Payer: Self-pay

## 2019-01-02 ENCOUNTER — Encounter: Payer: Self-pay | Admitting: Family

## 2019-01-02 ENCOUNTER — Ambulatory Visit: Payer: Medicaid Other | Attending: Family | Admitting: Family

## 2019-01-02 VITALS — BP 126/90 | HR 80 | Resp 18 | Ht 75.0 in | Wt 348.4 lb

## 2019-01-02 DIAGNOSIS — Z6841 Body Mass Index (BMI) 40.0 and over, adult: Secondary | ICD-10-CM | POA: Diagnosis not present

## 2019-01-02 DIAGNOSIS — Z720 Tobacco use: Secondary | ICD-10-CM

## 2019-01-02 DIAGNOSIS — I5032 Chronic diastolic (congestive) heart failure: Secondary | ICD-10-CM | POA: Diagnosis not present

## 2019-01-02 DIAGNOSIS — G4733 Obstructive sleep apnea (adult) (pediatric): Secondary | ICD-10-CM

## 2019-01-02 DIAGNOSIS — Z7982 Long term (current) use of aspirin: Secondary | ICD-10-CM | POA: Diagnosis not present

## 2019-01-02 DIAGNOSIS — Z79899 Other long term (current) drug therapy: Secondary | ICD-10-CM | POA: Diagnosis not present

## 2019-01-02 DIAGNOSIS — I110 Hypertensive heart disease with heart failure: Secondary | ICD-10-CM | POA: Insufficient documentation

## 2019-01-02 DIAGNOSIS — I10 Essential (primary) hypertension: Secondary | ICD-10-CM

## 2019-01-02 DIAGNOSIS — F17210 Nicotine dependence, cigarettes, uncomplicated: Secondary | ICD-10-CM | POA: Diagnosis not present

## 2019-01-02 NOTE — Patient Instructions (Signed)
Continue weighing daily and call for an overnight weight gain of > 2 pounds or a weekly weight gain of >5 pounds. 

## 2019-03-31 ENCOUNTER — Ambulatory Visit: Payer: Medicaid Other | Admitting: Family
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
## 2019-04-13 ENCOUNTER — Ambulatory Visit: Payer: Medicaid Other | Admitting: Family
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
## 2019-04-21 ENCOUNTER — Telehealth: Payer: Self-pay | Admitting: Family

## 2019-04-21 ENCOUNTER — Ambulatory Visit: Payer: Medicaid Other | Admitting: Family

## 2019-04-21 NOTE — Telephone Encounter (Signed)
Patient did not show for his [facility] appointment on 04/21/19. Will attempt to reschedule.

## 2019-05-26 ENCOUNTER — Emergency Department: Payer: Medicaid Other

## 2019-05-26 ENCOUNTER — Inpatient Hospital Stay
Admission: EM | Admit: 2019-05-26 | Discharge: 2019-06-01 | DRG: 291 | Disposition: A | Discharge status: Home Health | Payer: Medicaid Other | Attending: Family Medicine | Admitting: Family Medicine

## 2019-05-26 ENCOUNTER — Other Ambulatory Visit: Payer: Self-pay

## 2019-05-26 ENCOUNTER — Inpatient Hospital Stay: Payer: Medicaid Other

## 2019-05-26 ENCOUNTER — Encounter: Payer: Self-pay | Admitting: Emergency Medicine

## 2019-05-26 DIAGNOSIS — E662 Morbid (severe) obesity with alveolar hypoventilation: Secondary | ICD-10-CM | POA: Diagnosis present

## 2019-05-26 DIAGNOSIS — G9341 Metabolic encephalopathy: Secondary | ICD-10-CM | POA: Diagnosis present

## 2019-05-26 DIAGNOSIS — F17210 Nicotine dependence, cigarettes, uncomplicated: Secondary | ICD-10-CM | POA: Diagnosis present

## 2019-05-26 DIAGNOSIS — Z20822 Contact with and (suspected) exposure to covid-19: Secondary | ICD-10-CM | POA: Diagnosis present

## 2019-05-26 DIAGNOSIS — J9620 Acute and chronic respiratory failure, unspecified whether with hypoxia or hypercapnia: Secondary | ICD-10-CM

## 2019-05-26 DIAGNOSIS — I110 Hypertensive heart disease with heart failure: Principal | ICD-10-CM | POA: Diagnosis present

## 2019-05-26 DIAGNOSIS — Z7982 Long term (current) use of aspirin: Secondary | ICD-10-CM | POA: Diagnosis not present

## 2019-05-26 DIAGNOSIS — Z6841 Body Mass Index (BMI) 40.0 and over, adult: Secondary | ICD-10-CM | POA: Diagnosis not present

## 2019-05-26 DIAGNOSIS — I10 Essential (primary) hypertension: Secondary | ICD-10-CM

## 2019-05-26 DIAGNOSIS — J9622 Acute and chronic respiratory failure with hypercapnia: Secondary | ICD-10-CM | POA: Diagnosis present

## 2019-05-26 DIAGNOSIS — R0602 Shortness of breath: Secondary | ICD-10-CM | POA: Diagnosis present

## 2019-05-26 DIAGNOSIS — I5033 Acute on chronic diastolic (congestive) heart failure: Secondary | ICD-10-CM | POA: Diagnosis present

## 2019-05-26 DIAGNOSIS — K219 Gastro-esophageal reflux disease without esophagitis: Secondary | ICD-10-CM | POA: Diagnosis present

## 2019-05-26 DIAGNOSIS — T50995A Adverse effect of other drugs, medicaments and biological substances, initial encounter: Secondary | ICD-10-CM | POA: Diagnosis present

## 2019-05-26 DIAGNOSIS — Z9114 Patient's other noncompliance with medication regimen: Secondary | ICD-10-CM | POA: Diagnosis not present

## 2019-05-26 DIAGNOSIS — G4733 Obstructive sleep apnea (adult) (pediatric): Secondary | ICD-10-CM | POA: Diagnosis not present

## 2019-05-26 DIAGNOSIS — J9601 Acute respiratory failure with hypoxia: Secondary | ICD-10-CM | POA: Diagnosis present

## 2019-05-26 DIAGNOSIS — D6959 Other secondary thrombocytopenia: Secondary | ICD-10-CM | POA: Diagnosis present

## 2019-05-26 DIAGNOSIS — Z79899 Other long term (current) drug therapy: Secondary | ICD-10-CM | POA: Diagnosis not present

## 2019-05-26 DIAGNOSIS — J984 Other disorders of lung: Secondary | ICD-10-CM

## 2019-05-26 DIAGNOSIS — J9621 Acute and chronic respiratory failure with hypoxia: Secondary | ICD-10-CM | POA: Diagnosis present

## 2019-05-26 DIAGNOSIS — I5023 Acute on chronic systolic (congestive) heart failure: Secondary | ICD-10-CM

## 2019-05-26 DIAGNOSIS — J9602 Acute respiratory failure with hypercapnia: Secondary | ICD-10-CM

## 2019-05-26 DIAGNOSIS — I5031 Acute diastolic (congestive) heart failure: Secondary | ICD-10-CM | POA: Diagnosis not present

## 2019-05-26 DIAGNOSIS — B1920 Unspecified viral hepatitis C without hepatic coma: Secondary | ICD-10-CM | POA: Diagnosis present

## 2019-05-26 LAB — CBC
HCT: 57.1 % — ABNORMAL HIGH (ref 39.0–52.0)
Hemoglobin: 14.8 g/dL (ref 13.0–17.0)
MCH: 21.4 pg — ABNORMAL LOW (ref 26.0–34.0)
MCHC: 25.9 g/dL — ABNORMAL LOW (ref 30.0–36.0)
MCV: 82.8 fL (ref 80.0–100.0)
Platelets: 112 10*3/uL — ABNORMAL LOW (ref 150–400)
RBC: 6.9 MIL/uL — ABNORMAL HIGH (ref 4.22–5.81)
RDW: 20.1 % — ABNORMAL HIGH (ref 11.5–15.5)
WBC: 6.9 10*3/uL (ref 4.0–10.5)
nRBC: 1.7 % — ABNORMAL HIGH (ref 0.0–0.2)

## 2019-05-26 LAB — BASIC METABOLIC PANEL
Anion gap: 10 (ref 5–15)
BUN: 24 mg/dL — ABNORMAL HIGH (ref 8–23)
CO2: 32 mmol/L (ref 22–32)
Calcium: 8.1 mg/dL — ABNORMAL LOW (ref 8.9–10.3)
Chloride: 98 mmol/L (ref 98–111)
Creatinine, Ser: 1.01 mg/dL (ref 0.61–1.24)
GFR calc Af Amer: >60 mL/min (ref >60)
GFR calc non Af Amer: >60 mL/min (ref >60)
Glucose, Bld: 82 mg/dL (ref 70–99)
Potassium: 4.4 mmol/L (ref 3.5–5.1)
Sodium: 140 mmol/L (ref 135–145)

## 2019-05-26 LAB — BLOOD GAS, VENOUS
Acid-Base Excess: 8 mmol/L — ABNORMAL HIGH (ref 0.0–2.0)
Acid-Base Excess: 9.4 mmol/L — ABNORMAL HIGH (ref 0.0–2.0)
Acid-Base Excess: 9.7 mmol/L — ABNORMAL HIGH (ref 0.0–2.0)
Bicarbonate: 39 mmol/L — ABNORMAL HIGH (ref 20.0–28.0)
Bicarbonate: 41.3 mmol/L — ABNORMAL HIGH (ref 20.0–28.0)
Bicarbonate: 42 mmol/L — ABNORMAL HIGH (ref 20.0–28.0)
Delivery systems: POSITIVE
FIO2: 0.9
O2 Saturation: 86.9 %
O2 Saturation: 74.5 %
O2 Saturation: 91.8 %
Patient temperature: 37
Patient temperature: 37
Patient temperature: 37
pCO2, Ven: 91 mmHg (ref 44.0–60.0)
pCO2, Ven: 101 mmHg (ref 44.0–60.0)
pCO2, Ven: 105 mmHg (ref 44.0–60.0)
pH, Ven: 7.24 — ABNORMAL LOW (ref 7.250–7.430)
pH, Ven: 7.22 — ABNORMAL LOW (ref 7.250–7.430)
pH, Ven: 7.21 — ABNORMAL LOW (ref 7.250–7.430)
pO2, Ven: 62 mmHg — ABNORMAL HIGH (ref 32.0–45.0)
pO2, Ven: 48 mmHg — ABNORMAL HIGH (ref 32.0–45.0)
pO2, Ven: 76 mmHg — ABNORMAL HIGH (ref 32.0–45.0)

## 2019-05-26 LAB — RESPIRATORY PANEL BY RT PCR (FLU A&B, COVID)
Influenza A by PCR: NEGATIVE
Influenza B by PCR: NEGATIVE
SARS Coronavirus 2 by RT PCR: NEGATIVE

## 2019-05-26 LAB — BLOOD GAS, ARTERIAL
Acid-Base Excess: 12.9 mmol/L — ABNORMAL HIGH (ref 0.0–2.0)
Bicarbonate: 39.1 mmol/L — ABNORMAL HIGH (ref 20.0–28.0)
FIO2: 1
MECHVT: 500 mL
O2 Saturation: 96.6 %
PEEP: 5 cmH2O
Patient temperature: 37
RATE: 24 resp/min
pCO2 arterial: 55 mmHg — ABNORMAL HIGH (ref 32.0–48.0)
pH, Arterial: 7.46 — ABNORMAL HIGH (ref 7.350–7.450)
pO2, Arterial: 82 mmHg — ABNORMAL LOW (ref 83.0–108.0)

## 2019-05-26 LAB — HEPATIC FUNCTION PANEL
ALT: 16 U/L (ref 0–44)
AST: 18 U/L (ref 15–41)
Albumin: 3.5 g/dL (ref 3.5–5.0)
Alkaline Phosphatase: 102 U/L (ref 38–126)
Bilirubin, Direct: 0.3 mg/dL — ABNORMAL HIGH (ref 0.0–0.2)
Indirect Bilirubin: 0.7 mg/dL (ref 0.3–0.9)
Total Bilirubin: 1 mg/dL (ref 0.3–1.2)
Total Protein: 8.1 g/dL (ref 6.5–8.1)

## 2019-05-26 LAB — TRIGLYCERIDES: Triglycerides: 80 mg/dL (ref <150)

## 2019-05-26 LAB — MRSA PCR SCREENING: MRSA by PCR: NEGATIVE

## 2019-05-26 LAB — TROPONIN I (HIGH SENSITIVITY)
Troponin I (High Sensitivity): 11 ng/L (ref <18)
Troponin I (High Sensitivity): 8 ng/L (ref <18)

## 2019-05-26 LAB — URINE DRUG SCREEN, QUALITATIVE (ARMC ONLY)
Amphetamines, Ur Screen: NOT DETECTED
Barbiturates, Ur Screen: NOT DETECTED
Benzodiazepine, Ur Scrn: NOT DETECTED
Cannabinoid 50 Ng, Ur ~~LOC~~: POSITIVE — AB
Cocaine Metabolite,Ur ~~LOC~~: NOT DETECTED
MDMA (Ecstasy)Ur Screen: NOT DETECTED
Methadone Scn, Ur: NOT DETECTED
Opiate, Ur Screen: NOT DETECTED
Phencyclidine (PCP) Ur S: NOT DETECTED
Tricyclic, Ur Screen: NOT DETECTED

## 2019-05-26 LAB — BRAIN NATRIURETIC PEPTIDE: B Natriuretic Peptide: 1361 pg/mL — ABNORMAL HIGH (ref 0.0–100.0)

## 2019-05-26 LAB — LIPASE, BLOOD: Lipase: 28 U/L (ref 11–51)

## 2019-05-26 LAB — POC SARS CORONAVIRUS 2 AG: SARS Coronavirus 2 Ag: NEGATIVE

## 2019-05-26 LAB — GLUCOSE, CAPILLARY: Glucose-Capillary: 78 mg/dL (ref 70–99)

## 2019-05-26 MED ORDER — FENTANYL 2500MCG IN NS 250ML (10MCG/ML) PREMIX INFUSION
INTRAVENOUS | Status: AC
  Administered 2019-05-26: 100 ug/h via INTRAVENOUS
  Filled 2019-05-26: qty 250

## 2019-05-26 MED ORDER — SIMVASTATIN 10 MG PO TABS: 10.0000 mg | ORAL_TABLET | Freq: Every day | ORAL | Status: DC

## 2019-05-26 MED ORDER — LOSARTAN POTASSIUM 50 MG PO TABS
50.0000 mg | ORAL_TABLET | Freq: Every day | ORAL | Status: DC
  Administered 2019-05-27: 50 mg via ORAL
  Filled 2019-05-26: qty 1

## 2019-05-26 MED ORDER — ENOXAPARIN SODIUM 40 MG/0.4ML ~~LOC~~ SOLN
40.0000 mg | SUBCUTANEOUS | Status: DC
  Administered 2019-05-26 – 2019-05-30 (×5): 40 mg via SUBCUTANEOUS
  Filled 2019-05-26 (×5): qty 0.4

## 2019-05-26 MED ORDER — PROPOFOL 1000 MG/100ML IV EMUL: 5.0000 ug/kg/min | INTRAVENOUS | Status: DC

## 2019-05-26 MED ORDER — ONDANSETRON HCL 4 MG/2ML IJ SOLN: 4.0000 mg | Freq: Four times a day (QID) | INTRAMUSCULAR | Status: DC | PRN

## 2019-05-26 MED ORDER — IPRATROPIUM-ALBUTEROL 0.5-2.5 (3) MG/3ML IN SOLN
3.0000 mL | Freq: Once | RESPIRATORY_TRACT | Status: AC
  Administered 2019-05-26: 3 mL via RESPIRATORY_TRACT
  Filled 2019-05-26: qty 3

## 2019-05-26 MED ORDER — ASPIRIN 81 MG PO CHEW
81.0000 mg | CHEWABLE_TABLET | Freq: Every day | ORAL | Status: DC
  Administered 2019-05-27 – 2019-05-28 (×2): 81 mg
  Filled 2019-05-26 (×2): qty 1

## 2019-05-26 MED ORDER — FENTANYL CITRATE (PF) 100 MCG/2ML IJ SOLN
100.0000 ug | Freq: Once | INTRAMUSCULAR | Status: AC
  Administered 2019-05-26: 21:00:00 100 ug via INTRAVENOUS
  Filled 2019-05-26: qty 2

## 2019-05-26 MED ORDER — FENTANYL BOLUS VIA INFUSION
50.0000 ug | INTRAVENOUS | Status: DC | PRN
  Filled 2019-05-26: qty 100

## 2019-05-26 MED ORDER — ONDANSETRON HCL 4 MG PO TABS: 4.0000 mg | ORAL_TABLET | Freq: Four times a day (QID) | ORAL | Status: DC | PRN

## 2019-05-26 MED ORDER — FENTANYL CITRATE (PF) 100 MCG/2ML IJ SOLN
100.0000 ug | INTRAMUSCULAR | Status: AC | PRN
  Administered 2019-05-26 (×3): 100 ug via INTRAVENOUS
  Filled 2019-05-26 (×3): qty 2

## 2019-05-26 MED ORDER — PROPOFOL 1000 MG/100ML IV EMUL
INTRAVENOUS | Status: AC
  Administered 2019-05-26: 5 ug/kg/min via INTRAVENOUS
  Filled 2019-05-26: qty 100

## 2019-05-26 MED ORDER — ASPIRIN 81 MG PO CHEW: 81.0000 mg | CHEWABLE_TABLET | Freq: Every day | ORAL | Status: DC

## 2019-05-26 MED ORDER — SIMVASTATIN 20 MG PO TABS: 10.0000 mg | ORAL_TABLET | Freq: Every day | ORAL | Status: DC

## 2019-05-26 MED ORDER — SUCCINYLCHOLINE CHLORIDE 20 MG/ML IJ SOLN
200.0000 mg | Freq: Once | INTRAMUSCULAR | Status: AC
  Administered 2019-05-26: 200 mg via INTRAVENOUS

## 2019-05-26 MED ORDER — NALOXONE HCL 2 MG/2ML IJ SOSY: 1.0000 mg | PREFILLED_SYRINGE | Freq: Once | INTRAMUSCULAR | Status: AC

## 2019-05-26 MED ORDER — NALOXONE HCL 2 MG/2ML IJ SOSY
PREFILLED_SYRINGE | INTRAMUSCULAR | Status: AC
  Administered 2019-05-26: 1 mg via INTRAVENOUS
  Filled 2019-05-26: qty 2

## 2019-05-26 MED ORDER — METOPROLOL TARTRATE 25 MG PO TABS
12.5000 mg | ORAL_TABLET | Freq: Two times a day (BID) | ORAL | Status: DC
  Administered 2019-05-26 – 2019-05-27 (×2): 12.5 mg via ORAL
  Filled 2019-05-26 (×2): qty 1

## 2019-05-26 MED ORDER — FAMOTIDINE IN NACL 20-0.9 MG/50ML-% IV SOLN
20.0000 mg | INTRAVENOUS | Status: DC
  Administered 2019-05-26: 20 mg via INTRAVENOUS
  Filled 2019-05-26: qty 50

## 2019-05-26 MED ORDER — FENTANYL CITRATE (PF) 100 MCG/2ML IJ SOLN: 100.0000 ug | INTRAMUSCULAR | Status: DC | PRN

## 2019-05-26 MED ORDER — CHLORHEXIDINE GLUCONATE CLOTH 2 % EX PADS
6.0000 | MEDICATED_PAD | Freq: Every day | CUTANEOUS | Status: DC
  Administered 2019-05-26 – 2019-05-29 (×4): 6 via TOPICAL
  Filled 2019-05-26: qty 6

## 2019-05-26 MED ORDER — MIDAZOLAM HCL 5 MG/5ML IJ SOLN
4.0000 mg | Freq: Once | INTRAMUSCULAR | Status: AC
  Administered 2019-05-26: 4 mg via INTRAVENOUS
  Filled 2019-05-26: qty 5

## 2019-05-26 MED ORDER — FUROSEMIDE 10 MG/ML IJ SOLN
40.0000 mg | Freq: Two times a day (BID) | INTRAMUSCULAR | Status: AC
  Administered 2019-05-27 – 2019-05-31 (×10): 40 mg via INTRAVENOUS
  Filled 2019-05-26 (×10): qty 4

## 2019-05-26 MED ORDER — ACETAMINOPHEN 650 MG RE SUPP: 650.0000 mg | Freq: Four times a day (QID) | RECTAL | Status: DC | PRN

## 2019-05-26 MED ORDER — ACETAMINOPHEN 325 MG PO TABS: 650.0000 mg | ORAL_TABLET | Freq: Four times a day (QID) | ORAL | Status: DC | PRN

## 2019-05-26 MED ORDER — PROPOFOL 10 MG/ML IV BOLUS: 0.5000 mg/kg | Freq: Once | INTRAVENOUS | Status: DC

## 2019-05-26 MED ORDER — SPIRONOLACTONE 25 MG PO TABS
25.0000 mg | ORAL_TABLET | Freq: Every day | ORAL | Status: DC
  Administered 2019-05-27: 25 mg via ORAL
  Filled 2019-05-26: qty 1

## 2019-05-26 MED ORDER — PROPOFOL 1000 MG/100ML IV EMUL
0.0000 ug/kg/min | INTRAVENOUS | Status: DC
  Administered 2019-05-26: 15 ug/kg/min via INTRAVENOUS
  Administered 2019-05-26 (×2): 50 ug/kg/min via INTRAVENOUS
  Administered 2019-05-27: 30 ug/kg/min via INTRAVENOUS
  Administered 2019-05-27: 40 ug/kg/min via INTRAVENOUS
  Administered 2019-05-27 (×2): 50 ug/kg/min via INTRAVENOUS
  Administered 2019-05-27: 60 ug/kg/min via INTRAVENOUS
  Administered 2019-05-27: 40 ug/kg/min via INTRAVENOUS
  Administered 2019-05-27: 04:00:00 50 ug/kg/min via INTRAVENOUS
  Administered 2019-05-27: 40 ug/kg/min via INTRAVENOUS
  Administered 2019-05-27 – 2019-05-28 (×2): 50 ug/kg/min via INTRAVENOUS
  Administered 2019-05-28: 07:00:00 30 ug/kg/min via INTRAVENOUS
  Administered 2019-05-28: 09:00:00 35 ug/kg/min via INTRAVENOUS
  Administered 2019-05-28: 50 ug/kg/min via INTRAVENOUS
  Filled 2019-05-26 (×16): qty 100

## 2019-05-26 MED ORDER — FENTANYL 2500MCG IN NS 250ML (10MCG/ML) PREMIX INFUSION
0.0000 ug/h | INTRAVENOUS | Status: DC
  Administered 2019-05-27: 100 ug/h via INTRAVENOUS
  Filled 2019-05-26: qty 250

## 2019-05-26 MED ORDER — ETOMIDATE 2 MG/ML IV SOLN
40.0000 mg | Freq: Once | INTRAVENOUS | Status: AC
  Administered 2019-05-26: 40 mg via INTRAVENOUS

## 2019-05-26 MED ORDER — FUROSEMIDE 10 MG/ML IJ SOLN
80.0000 mg | Freq: Once | INTRAMUSCULAR | Status: AC
  Administered 2019-05-26: 17:00:00 80 mg via INTRAVENOUS
  Filled 2019-05-26: qty 8

## 2019-05-26 NOTE — ED Notes (Signed)
X-ray at bedside

## 2019-05-26 NOTE — Progress Notes (Signed)
[facility] Progress Note Patient Name: Gary [name] DOB: 12/02/1955 MRN: [id number]   Date of Service  05/26/2019  HPI/Events of Note  Pt admitted to the ICU following intubation for acute hypoxemic/ hypercapnic respiratory failure caused by acute exacerbation of patient's known congestive heart failure.  eICU Interventions  New Patient Evaluation completed.        Kerry Kass Jaila Schellhorn 05/26/2019, 10:24 PM

## 2019-05-26 NOTE — ED Notes (Signed)
Patient agitated and refusing to wear external catheter. This RN explained to patient that he would be urinating very frequently due to amount of meds given. Patient continues to refuse and states "I know when I have to pee now. I'll just use the urinal." Urinal at bedside for patient  At this time.

## 2019-05-26 NOTE — ED Triage Notes (Signed)
Patient from home via [facility]. Reports swelling in abdomen for the last few days. States he takes lasix and has been compliant. Reports he has also had some increased SOB.   Patient wears O2 as needed at home. Placed on 2L by EMS.

## 2019-05-26 NOTE — ED Notes (Signed)
Pt sedation increased per orders due to agitation and inadequate sedation
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
## 2019-05-26 NOTE — ED Notes (Signed)
Intubation performed by isaacs, MD. RT at bedside
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
## 2019-05-26 NOTE — ED Notes (Signed)
Per Ellender Hose, MD, 2 boluses of 55mcg given per pt agitation
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
## 2019-05-27 ENCOUNTER — Inpatient Hospital Stay (HOSPITAL_COMMUNITY)
Admit: 2019-05-27 | Discharge: 2019-05-27 | Disposition: A | Discharge status: Home / Self Care | Payer: Medicaid Other | Attending: Pulmonary Disease | Admitting: Pulmonary Disease

## 2019-05-27 DIAGNOSIS — I5031 Acute diastolic (congestive) heart failure: Secondary | ICD-10-CM

## 2019-05-27 LAB — BASIC METABOLIC PANEL
Anion gap: 8 (ref 5–15)
BUN: 23 mg/dL (ref 8–23)
CO2: 32 mmol/L (ref 22–32)
Calcium: 8.1 mg/dL — ABNORMAL LOW (ref 8.9–10.3)
Chloride: 101 mmol/L (ref 98–111)
Creatinine, Ser: 1 mg/dL (ref 0.61–1.24)
GFR calc Af Amer: >60 mL/min (ref >60)
GFR calc non Af Amer: >60 mL/min (ref >60)
Glucose, Bld: 66 mg/dL — ABNORMAL LOW (ref 70–99)
Potassium: 3.7 mmol/L (ref 3.5–5.1)
Sodium: 141 mmol/L (ref 135–145)

## 2019-05-27 LAB — ECHOCARDIOGRAM COMPLETE
Height: 75 in
Weight: 5120 oz

## 2019-05-27 LAB — CBC
HCT: 52.1 % — ABNORMAL HIGH (ref 39.0–52.0)
Hemoglobin: 14.3 g/dL (ref 13.0–17.0)
MCH: 21.5 pg — ABNORMAL LOW (ref 26.0–34.0)
MCHC: 27.4 g/dL — ABNORMAL LOW (ref 30.0–36.0)
MCV: 78.3 fL — ABNORMAL LOW (ref 80.0–100.0)
Platelets: 100 10*3/uL — ABNORMAL LOW (ref 150–400)
RBC: 6.65 MIL/uL — ABNORMAL HIGH (ref 4.22–5.81)
RDW: 20.2 % — ABNORMAL HIGH (ref 11.5–15.5)
WBC: 6.9 10*3/uL (ref 4.0–10.5)
nRBC: 1.2 % — ABNORMAL HIGH (ref 0.0–0.2)

## 2019-05-27 LAB — GLUCOSE, CAPILLARY
Glucose-Capillary: 75 mg/dL (ref 70–99)
Glucose-Capillary: 76 mg/dL (ref 70–99)
Glucose-Capillary: 78 mg/dL (ref 70–99)
Glucose-Capillary: 82 mg/dL (ref 70–99)
Glucose-Capillary: 94 mg/dL (ref 70–99)

## 2019-05-27 LAB — PHOSPHORUS
Phosphorus: 1.1 mg/dL — ABNORMAL LOW (ref 2.5–4.6)
Phosphorus: 2.4 mg/dL — ABNORMAL LOW (ref 2.5–4.6)

## 2019-05-27 LAB — MAGNESIUM: Magnesium: 2.3 mg/dL (ref 1.7–2.4)

## 2019-05-27 LAB — HEPATITIS C ANTIBODY: HCV Ab: REACTIVE — AB

## 2019-05-27 LAB — POTASSIUM: Potassium: 3.5 mmol/L (ref 3.5–5.1)

## 2019-05-27 MED ORDER — ACETAMINOPHEN 325 MG PO TABS: 650.0000 mg | ORAL_TABLET | Freq: Four times a day (QID) | ORAL | Status: DC | PRN

## 2019-05-27 MED ORDER — FAMOTIDINE 20 MG PO TABS: 20.0000 mg | ORAL_TABLET | Freq: Two times a day (BID) | ORAL | Status: DC

## 2019-05-27 MED ORDER — PANTOPRAZOLE SODIUM 40 MG PO PACK
40.0000 mg | PACK | Freq: Every day | ORAL | Status: DC
  Administered 2019-05-28: 09:00:00 40 mg
  Filled 2019-05-27: qty 20

## 2019-05-27 MED ORDER — ADULT MULTIVITAMIN LIQUID CH
15.0000 mL | Freq: Every day | ORAL | Status: DC
  Administered 2019-05-28: 15 mL
  Filled 2019-05-27: qty 15

## 2019-05-27 MED ORDER — VITAL HIGH PROTEIN PO LIQD
1000.0000 mL | ORAL | Status: DC
  Administered 2019-05-27: 1000 mL

## 2019-05-27 MED ORDER — ACETAMINOPHEN 650 MG RE SUPP: 650.0000 mg | Freq: Four times a day (QID) | RECTAL | Status: DC | PRN

## 2019-05-27 MED ORDER — PRO-STAT SUGAR FREE PO LIQD
60.0000 mL | Freq: Four times a day (QID) | ORAL | Status: DC
  Administered 2019-05-27 – 2019-05-28 (×4): 60 mL

## 2019-05-27 MED ORDER — POTASSIUM PHOSPHATES 15 MMOLE/5ML IV SOLN
10.0000 mmol | Freq: Once | INTRAVENOUS | Status: AC
  Administered 2019-05-27: 20:00:00 10 mmol via INTRAVENOUS
  Filled 2019-05-27: qty 3.33

## 2019-05-27 MED ORDER — PRO-STAT SUGAR FREE PO LIQD: 60.0000 mL | Freq: Four times a day (QID) | ORAL | Status: DC

## 2019-05-27 MED ORDER — SENNOSIDES-DOCUSATE SODIUM 8.6-50 MG PO TABS
2.0000 | ORAL_TABLET | Freq: Two times a day (BID) | ORAL | Status: DC
  Administered 2019-05-27 – 2019-05-28 (×3): 2
  Filled 2019-05-27 (×3): qty 2

## 2019-05-27 MED ORDER — CHLORHEXIDINE GLUCONATE 0.12% ORAL RINSE (MEDLINE KIT)
15.0000 mL | Freq: Two times a day (BID) | OROMUCOSAL | Status: DC
  Administered 2019-05-27 – 2019-05-28 (×4): 15 mL via OROMUCOSAL

## 2019-05-27 MED ORDER — ORAL CARE MOUTH RINSE
15.0000 mL | OROMUCOSAL | Status: DC
  Administered 2019-05-27 – 2019-05-28 (×15): 15 mL via OROMUCOSAL

## 2019-05-27 MED ORDER — POTASSIUM PHOSPHATES 15 MMOLE/5ML IV SOLN
30.0000 mmol | Freq: Once | INTRAVENOUS | Status: AC
  Administered 2019-05-27: 07:00:00 30 mmol via INTRAVENOUS
  Filled 2019-05-27: qty 10
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
## 2019-05-27 NOTE — Clinical Social Work Note (Signed)
CSW acknowledges consult for [facility]. [employer] representative is aware and has MD signature on order.  Gary [name], [place]

## 2019-05-27 NOTE — Clinical Social Work Note (Signed)
Patient continues to exhibit signs of hypercapnia associated with chronic respiratory failure secondary to severe COPD. Patient requires the use of NIV both QHS and daytime to help with exacerbation periods. The use of the NIV will treat patient's high PCO2 levels and can reduce risk of exacerbations and future hospitalizations when used at night and during the day. Pt will need these advanced settings in conjunction with her current medication regimen; BIPAP is not an option due to its functional limitations and the severity of the patient's condition. Failure to have NIV available for use over a 24 hour period could lead to death. Patient able to maintain their own airway and clear their own secretions.   Dayton Scrape, [place]

## 2019-05-27 NOTE — Progress Notes (Signed)
*  PRELIMINARY RESULTS* Echocardiogram 2D Echocardiogram has been performed.  Gary [name] 05/27/2019, 1:39 PM

## 2019-05-27 NOTE — Consult Note (Addendum)
PHARMACY CONSULT NOTE - FOLLOW UP  Pharmacy Consult for Electrolyte Monitoring and Replacement   Recent Labs: Potassium (mmol/L)  Date Value  05/27/2019 3.7  04/15/2014 3.9   Magnesium (mg/dL)  Date Value  05/27/2019 2.3  08/21/2012 1.9   Calcium (mg/dL)  Date Value  05/27/2019 8.1 (L)   Calcium, Total (mg/dL)  Date Value  04/15/2014 8.3 (L)   Albumin (g/dL)  Date Value  05/26/2019 3.5  04/15/2014 2.9 (L)   Phosphorus (mg/dL)  Date Value  05/27/2019 1.1 (L)   Sodium (mmol/L)  Date Value  05/27/2019 141  04/15/2014 140     Assessment: 64 year old male admitted with acute on chronic hypercapnic hypoxic respiratory failure and dCHF exacerbation.  He has PMH for HTN, obesity, and HFrEF.  He was intubated 2/9, and is currently on lasix 40 mg IV BID.  1.  Electrolytes Given cardiac history, goals of therapy include K ~ 4 mmol/L and magnesium ~2 mg/dL.  All other electrolytes WNL. Give potassium phosphate 30 mmol IV x 1 for low phosphorus.  Expect this will also add 45 mEq of potassium supplementation. Will re-check phosphorus at 1800.  Electrolytes with AM labs.  2.  Glucose Current 24-hour SBG trend is 66-94.  No history for diabetes.  Patient has been NPO, but diet is currently advancing through OGT.  He has current orders for Pro-Stat 60 mL QID,  Vital High Protein at 25 mL/hour and liquid MVI VT daily.  Currently no orders for SBG control and no steroids on board.  Will assess SBG daily.  3.  Constipation LBM unknown (admitted 2/9).  He is sedated with fentanyl and propofol.  Currently no orders for laxatives, but will start senna-docusate as fentanyl will induce constipation.  Will re-assess tomorrow as diet advances.  Gerald Dexter ,PharmD 05/27/2019 11:26 AM

## 2019-05-27 NOTE — Plan of Care (Signed)
Family updated on POC, password obtained, COVID visitation policy acknowledged.  Pt lives with girlfriend

## 2019-05-27 NOTE — Consult Note (Signed)
PHARMACY CONSULT NOTE - FOLLOW UP  Pharmacy Consult for Electrolyte Monitoring and Replacement   Recent Labs: Potassium (mmol/L)  Date Value  05/27/2019 3.7  04/15/2014 3.9   Magnesium (mg/dL)  Date Value  05/27/2019 2.3  08/21/2012 1.9   Calcium (mg/dL)  Date Value  05/27/2019 8.1 (L)   Calcium, Total (mg/dL)  Date Value  04/15/2014 8.3 (L)   Albumin (g/dL)  Date Value  05/26/2019 3.5  04/15/2014 2.9 (L)   Phosphorus (mg/dL)  Date Value  05/27/2019 1.1 (L)   Sodium (mmol/L)  Date Value  05/27/2019 141  04/15/2014 140   Corrected Ca: 8.50 mg/dL  Assessment: 64 year old male admitted with acute on chronic hypercapnic hypoxic respiratory failure and dCHF exacerbation.  He has PMH for HTN, obesity, and HFrEF.  He was intubated 2/9, and is currently on lasix 40 mg IV BID.  1.  Electrolytes Given cardiac history, goals of therapy include K ~ 4 mmol/L and magnesium ~2 mg/dL.  All other electrolytes WNL.  This morning he received potassium phosphate 30 mmol IV x 1   Post-administration phosphorous 0210 1759 2.4 mg/dL  Post-administration potassium 0210 1759 3.5 mmol/L  10 mmol additional potassium phosphate (this provides 14.5 mEq IV potassium)  Next electrolytes with AM labs.   Dallie Piles ,PharmD 05/27/2019 1:21 PM
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
## 2019-05-27 NOTE — Progress Notes (Addendum)
Initial Nutrition Assessment  DOCUMENTATION CODES:   Obesity unspecified  INTERVENTION:   Initiate Vital HP @25ml /hr + PS 86ml QID via tube   Propofol: 26.1 ml/hr- provides 689kcal/day   Free water flushes 52ml q4 hours to maintain tube patency   Regimen provides 2089kcal/day, 173g/day protein, 668ml/day free water   Liquid MVI daily via tube   Pt at refeed risk; recommend monitor K, Mg and P labs daily until stable.   NUTRITION DIAGNOSIS:   Inadequate oral intake related to inability to eat(pt sedated and ventilated) as evidenced by NPO status.  GOAL:   Provide needs based on ASPEN/SCCM guidelines  MONITOR:   Vent status, Labs, Weight trends, Skin, I & O's, TF tolerance  REASON FOR ASSESSMENT:   Consult Enteral/tube feeding initiation and management  ASSESSMENT:   64 yo male admitted with acute on chronic hypoxic hypercapnic respiratory failure secondary to acute on chronic diastolic CHF exacerbation complicated by OSA/OHS and morbid obesity requiring mechanical intubation  Pt sedated and ventilated. OGT in place. Plan is to initiate tube feeds today. Phosphorus low and is being supplemented. Per chart, pt down ~30lbs(9%) since September; RD unsure how recently weight loss occurred or if it was intentional.   Medications reviewed and include: aspirin, lovenox, lasix, protonix, senokot, fentanyl, K Phos, propofol   Labs reviewed: K 3.7 wnl, P 1.1(L), Mg 2.3 wnl BNP- 1361(H)- 2/9 MCV 78.3(L), MCH 21.5(L), MCHC 27.4(L)  Patient is currently intubated on ventilator support MV: 12.2 L/min Temp (24hrs), Avg:98.8 F (37.1 C), Min:98.6 F (37 C), Max:99.3 F (37.4 C)  Propofol: 26.1 ml/hr- provides 689kcal/day   MAP- >52mmHg  UOP- 1570ml   NUTRITION - FOCUSED PHYSICAL EXAM:    Most Recent Value  Orbital Region  No depletion  Upper Arm Region  No depletion  Thoracic and Lumbar Region  No depletion  Buccal Region  No depletion  Temple Region  No depletion   Clavicle Bone Region  No depletion  Clavicle and Acromion Bone Region  No depletion  Scapular Bone Region  No depletion  Dorsal Hand  No depletion  Patellar Region  No depletion  Anterior Thigh Region  No depletion  Posterior Calf Region  No depletion  Edema (RD Assessment)  Moderate  Hair  Reviewed  Eyes  Reviewed  Mouth  Reviewed  Skin  Reviewed  Nails  Reviewed     Diet Order:   Diet Order            Diet NPO time specified Except for: Sips with Meds  Diet effective now             EDUCATION NEEDS:   Not appropriate for education at this time  Skin:  Skin Assessment: Reviewed RN Assessment  Last BM:  pta  Height:   Ht Readings from Last 1 Encounters:  05/26/19 6\' 3"  (1.905 m)    Weight:   Wt Readings from Last 1 Encounters:  05/26/19 (!) 145.2 kg    Ideal Body Weight:  89 kg  BMI:  Body mass index is 40 kg/m.  Estimated Nutritional Needs:   Kcal:  1595-2030kcal/day  Protein:  >178g/day  Fluid:  1.2L per MD  Koleen Distance MS, RD, LDN Contact information available in Amion

## 2019-05-28 ENCOUNTER — Inpatient Hospital Stay: Payer: Medicaid Other

## 2019-05-28 LAB — CBC WITH DIFFERENTIAL/PLATELET
Abs Immature Granulocytes: 0.02 10*3/uL (ref 0.00–0.07)
Basophils Absolute: 0 10*3/uL (ref 0.0–0.1)
Basophils Relative: 0 %
Eosinophils Absolute: 0.1 10*3/uL (ref 0.0–0.5)
Eosinophils Relative: 1 %
HCT: 53 % — ABNORMAL HIGH (ref 39.0–52.0)
Hemoglobin: 14.7 g/dL (ref 13.0–17.0)
Immature Granulocytes: 0 %
Lymphocytes Relative: 17 %
Lymphs Abs: 1 10*3/uL (ref 0.7–4.0)
MCH: 21.3 pg — ABNORMAL LOW (ref 26.0–34.0)
MCHC: 27.7 g/dL — ABNORMAL LOW (ref 30.0–36.0)
MCV: 76.8 fL — ABNORMAL LOW (ref 80.0–100.0)
Monocytes Absolute: 0.7 10*3/uL (ref 0.1–1.0)
Monocytes Relative: 12 %
Neutro Abs: 4.1 10*3/uL (ref 1.7–7.7)
Neutrophils Relative %: 70 %
Platelets: 99 10*3/uL — ABNORMAL LOW (ref 150–400)
RBC: 6.9 MIL/uL — ABNORMAL HIGH (ref 4.22–5.81)
RDW: 19.8 % — ABNORMAL HIGH (ref 11.5–15.5)
WBC: 6 10*3/uL (ref 4.0–10.5)
nRBC: 0.3 % — ABNORMAL HIGH (ref 0.0–0.2)

## 2019-05-28 LAB — GLUCOSE, CAPILLARY
Glucose-Capillary: 101 mg/dL — ABNORMAL HIGH (ref 70–99)
Glucose-Capillary: 118 mg/dL — ABNORMAL HIGH (ref 70–99)
Glucose-Capillary: 85 mg/dL (ref 70–99)
Glucose-Capillary: 87 mg/dL (ref 70–99)
Glucose-Capillary: 88 mg/dL (ref 70–99)
Glucose-Capillary: 88 mg/dL (ref 70–99)

## 2019-05-28 LAB — BASIC METABOLIC PANEL
Anion gap: 10 (ref 5–15)
BUN: 32 mg/dL — ABNORMAL HIGH (ref 8–23)
CO2: 30 mmol/L (ref 22–32)
Calcium: 7.9 mg/dL — ABNORMAL LOW (ref 8.9–10.3)
Chloride: 100 mmol/L (ref 98–111)
Creatinine, Ser: 1.09 mg/dL (ref 0.61–1.24)
GFR calc Af Amer: >60 mL/min (ref >60)
GFR calc non Af Amer: >60 mL/min (ref >60)
Glucose, Bld: 85 mg/dL (ref 70–99)
Potassium: 3.7 mmol/L (ref 3.5–5.1)
Sodium: 140 mmol/L (ref 135–145)

## 2019-05-28 LAB — PHOSPHORUS: Phosphorus: 4 mg/dL (ref 2.5–4.6)

## 2019-05-28 LAB — MAGNESIUM: Magnesium: 2.2 mg/dL (ref 1.7–2.4)

## 2019-05-28 MED ORDER — ASPIRIN 81 MG PO CHEW
81.0000 mg | CHEWABLE_TABLET | Freq: Every day | ORAL | Status: DC
  Administered 2019-05-29 – 2019-06-01 (×4): 81 mg via ORAL
  Filled 2019-05-28 (×4): qty 1

## 2019-05-28 MED ORDER — PANTOPRAZOLE SODIUM 40 MG PO TBEC
40.0000 mg | DELAYED_RELEASE_TABLET | Freq: Every day | ORAL | Status: DC
  Administered 2019-05-29 – 2019-06-01 (×4): 40 mg via ORAL
  Filled 2019-05-28 (×4): qty 1

## 2019-05-28 MED ORDER — POTASSIUM CHLORIDE 20 MEQ PO PACK
40.0000 meq | PACK | Freq: Once | ORAL | Status: DC
  Filled 2019-05-28: qty 2

## 2019-05-28 MED ORDER — ORAL CARE MOUTH RINSE
15.0000 mL | Freq: Two times a day (BID) | OROMUCOSAL | Status: DC
  Administered 2019-05-28 – 2019-05-31 (×6): 15 mL via OROMUCOSAL
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
## 2019-05-28 NOTE — Progress Notes (Signed)
Pt. Extubated to 3lnc,sat 94,no resp. Distress noted at this time.
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
## 2019-05-28 NOTE — Consult Note (Signed)
PHARMACY CONSULT NOTE - FOLLOW UP  Pharmacy Consult for Electrolyte Monitoring and Replacement   Recent Labs: Potassium (mmol/L)  Date Value  05/28/2019 3.7  04/15/2014 3.9   Magnesium (mg/dL)  Date Value  05/28/2019 2.2  08/21/2012 1.9   Calcium (mg/dL)  Date Value  05/28/2019 7.9 (L)   Calcium, Total (mg/dL)  Date Value  04/15/2014 8.3 (L)   Albumin (g/dL)  Date Value  05/26/2019 3.5  04/15/2014 2.9 (L)   Phosphorus (mg/dL)  Date Value  05/28/2019 4.0   Sodium (mmol/L)  Date Value  05/28/2019 140  04/15/2014 140     Assessment: 64 year old male admitted with acute on chronic hypercapnic hypoxic respiratory failure and dCHF exacerbation.  He has PMH for HTN, obesity, and HFrEF. Patient now extubated; continuing on furosemide 40mg  IV BID.   1.  Electrolytes Potassium 27mEq PO x 1. Will continue to replace orally in the setting of CHF/diuresis.   Will replace for goal potassium ~ 4, goal magnesium ~2, and goal phos ~3.   Electrolytes with am labs.   Pharmacy will continue to monitor and adjust per consult.   Alveta Quintela L, RPh  05/28/2019 3:38 PM

## 2019-05-29 LAB — CBC WITH DIFFERENTIAL/PLATELET
Abs Immature Granulocytes: 0.02 10*3/uL (ref 0.00–0.07)
Basophils Absolute: 0 10*3/uL (ref 0.0–0.1)
Basophils Relative: 0 %
Eosinophils Absolute: 0.1 10*3/uL (ref 0.0–0.5)
Eosinophils Relative: 1 %
HCT: 55.2 % — ABNORMAL HIGH (ref 39.0–52.0)
Hemoglobin: 15.3 g/dL (ref 13.0–17.0)
Immature Granulocytes: 0 %
Lymphocytes Relative: 17 %
Lymphs Abs: 1 10*3/uL (ref 0.7–4.0)
MCH: 21.5 pg — ABNORMAL LOW (ref 26.0–34.0)
MCHC: 27.7 g/dL — ABNORMAL LOW (ref 30.0–36.0)
MCV: 77.4 fL — ABNORMAL LOW (ref 80.0–100.0)
Monocytes Absolute: 0.7 10*3/uL (ref 0.1–1.0)
Monocytes Relative: 12 %
Neutro Abs: 4.1 10*3/uL (ref 1.7–7.7)
Neutrophils Relative %: 70 %
Platelets: 102 10*3/uL — ABNORMAL LOW (ref 150–400)
RBC: 7.13 MIL/uL — ABNORMAL HIGH (ref 4.22–5.81)
RDW: 20.6 % — ABNORMAL HIGH (ref 11.5–15.5)
Smear Review: NORMAL
WBC: 6 10*3/uL (ref 4.0–10.5)
nRBC: 0 % (ref 0.0–0.2)

## 2019-05-29 LAB — MAGNESIUM: Magnesium: 2.1 mg/dL (ref 1.7–2.4)

## 2019-05-29 LAB — BASIC METABOLIC PANEL
Anion gap: 7 (ref 5–15)
BUN: 22 mg/dL (ref 8–23)
CO2: 33 mmol/L — ABNORMAL HIGH (ref 22–32)
Calcium: 8.1 mg/dL — ABNORMAL LOW (ref 8.9–10.3)
Chloride: 100 mmol/L (ref 98–111)
Creatinine, Ser: 0.96 mg/dL (ref 0.61–1.24)
GFR calc Af Amer: >60 mL/min (ref >60)
GFR calc non Af Amer: >60 mL/min (ref >60)
Glucose, Bld: 79 mg/dL (ref 70–99)
Potassium: 3.6 mmol/L (ref 3.5–5.1)
Sodium: 140 mmol/L (ref 135–145)

## 2019-05-29 LAB — AFP TUMOR MARKER: AFP, Serum, Tumor Marker: <0.9 ng/mL (ref 0.0–8.3)

## 2019-05-29 LAB — GLUCOSE, CAPILLARY: Glucose-Capillary: 81 mg/dL (ref 70–99)

## 2019-05-29 LAB — PHOSPHORUS: Phosphorus: 4.1 mg/dL (ref 2.5–4.6)

## 2019-05-29 MED ORDER — POTASSIUM CHLORIDE CRYS ER 20 MEQ PO TBCR
40.0000 meq | EXTENDED_RELEASE_TABLET | Freq: Once | ORAL | Status: AC
  Administered 2019-05-29: 40 meq via ORAL
  Filled 2019-05-29: qty 2

## 2019-05-29 MED ORDER — ENSURE MAX PROTEIN PO LIQD
11.0000 [oz_av] | Freq: Two times a day (BID) | ORAL | Status: DC
  Administered 2019-05-30 – 2019-06-01 (×5): 11 [oz_av] via ORAL
  Filled 2019-05-29: qty 330
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
## 2019-05-29 NOTE — Progress Notes (Signed)
Nutrition Follow-up  DOCUMENTATION CODES:   Obesity unspecified  INTERVENTION:  Provide Ensure Max Protein po BID, each supplement provides 150 kcal and 30 grams of protein.  NUTRITION DIAGNOSIS:   Inadequate oral intake related to inability to eat(pt sedated and ventilated) as evidenced by NPO status.  Resolving - patient was extubated and diet has been advanced.  GOAL:   Patient will meet greater than or equal to 90% of their needs  Progressing.  MONITOR:   PO intake, Supplement acceptance, Diet advancement, Labs, Weight trends, I & O's  REASON FOR ASSESSMENT:   Consult Enteral/tube feeding initiation and management  ASSESSMENT:   64 yo male admitted with acute on chronic hypoxic hypercapnic respiratory failure secondary to acute on chronic diastolic CHF exacerbation complicated by OSA/OHS and morbid obesity requiring mechanical intubation  Patient was extubated on 2/11. Diet was advanced to clear liquids initially and then soft. Unable to meet with patient as another provider was in the room. No meal completion documented at this time so unsure how well patient is eating. Will monitor for adequacy of intake. He will benefit from oral nutrition supplementation.  Medications reviewed and include: pantoprazole, potassium chloride 40 mEq once today.  Labs reviewed: CBG 81-118, CO2 33.  Diet Order:   Diet Order            DIET SOFT Room service appropriate? Yes; Fluid consistency: Thin  Diet effective now             EDUCATION NEEDS:   Not appropriate for education at this time  Skin:  Skin Assessment: Reviewed RN Assessment  Last BM:  Unknown  Height:   Ht Readings from Last 1 Encounters:  05/26/19 6\' 3"  (1.905 m)   Weight:   Wt Readings from Last 1 Encounters:  05/26/19 (!) 145.2 kg   Ideal Body Weight:  89 kg  BMI:  Body mass index is 40 kg/m.  Estimated Nutritional Needs:   Kcal:  2600-2800  Protein:  >145 grams  Fluid:  per MD  Jacklynn Barnacle, MS, RD, LDN Pager number available on Amion

## 2019-05-29 NOTE — Evaluation (Signed)
Occupational Therapy Evaluation Patient Details Name: Gary [name] MRN: [id number] DOB: 02/18/1956 Today's Date: 05/29/2019    History of Present Illness 64 yo male with a PMH of Chronic Diastolic CHF, prn Home O2, OSA.  He did need intubation for 2 days, at time of PT exam he is out of CCU and feeling much better.   Clinical Impression   Pt seen for OT evaluation this date. Pt was independent in all ADL and functional mobility, living in a 1 story apartment with 1 step and threshold to enter. Pt reports being on 2 liters of O2 at home. Pt reports he does weigh himself often and able to verbalize need to see MD if weight gain of 2-5lbs in 1-2 days occurs. Pt does endorse that he doesn't do it everyday. Pt educated in chronic disease mgt including consistency with weighing himself and monitoring weight fluctuations and cognitive compensatory strategies to improve pt's compliance and develop a routine. Pt currently requires PRN Min A and supervision to [facility] for LB ADL tasks due to current functional impairments (See OT Problem List below). Pt educated in energy conservation strategies including activity pacing, home/routines modifications, work simplification, AE/DME, prioritizing of meaningful occupations, and falls prevention. Handout provided. Pt verbalized understanding and would benefit from additional skilled OT services to maximize recall and carryover of learned techniques and facilitate implementation of learned techniques into daily routines. Upon discharge, recommend [place] services.  Pt agreeable.    Follow Up Recommendations  Home health OT    Equipment Recommendations  Tub/shower seat;Tub/shower bench(bariatric shower chair vs tub transfer bench)    Recommendations for Other Services       Precautions / Restrictions Restrictions Weight Bearing Restrictions: No      Mobility Bed Mobility Overal bed mobility: Modified Independent             General bed mobility comments:  deferred, up in recliner at start and end of session  Transfers Overall transfer level: Modified independent Equipment used: Rolling walker (2 wheeled)             General transfer comment: Pt needed bed raised 2-3" but was able to rise w/o direct assist, very bent at waist to get to standing    Balance Overall balance assessment: Modified Independent                                         ADL either performed or assessed with clinical judgement   ADL                                         General ADL Comments: PRN Min A for LB ADL, Sup to CGA for ADL transfers     Vision Patient Visual Report: No change from baseline       Perception     Praxis      Pertinent Vitals/Pain Pain Assessment: No/denies pain     Hand Dominance     Extremity/Trunk Assessment Upper Extremity Assessment Upper Extremity Assessment: Overall WFL for tasks assessed   Lower Extremity Assessment Lower Extremity Assessment: Overall WFL for tasks assessed   Cervical / Trunk Assessment Cervical / Trunk Assessment: Normal   Communication Communication Communication: No difficulties   Cognition Arousal/Alertness: Awake/alert Behavior During Therapy: WFL for tasks assessed/performed Overall Cognitive  Status: Within Functional Limits for tasks assessed                                     General Comments  On 2L, O2 sats 93%, HR 77 at rest, denied SOB throughout    Exercises Other Exercises Other Exercises: Pt instructed in energy conservation strategies and chronic disease mgt strategies   Shoulder Instructions      Home Living Family/patient expects to be discharged to:: Private residence Living Arrangements: Spouse/significant other Available Help at Discharge: Family;Available PRN/intermittently Type of Home: Apartment Home Access: (reports he does have 1 step to get onto landing)     Home Layout: One level     Bathroom  Shower/Tub: Teacher, early years/pre: Handicapped height     Home Equipment: Cane - quad(states he lost his [facility])          Prior Functioning/Environment Level of Independence: Independent        Comments: Pt reports that he does not always need AD, able to do most activities w/o assist, minimally out of the home. 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, reports 2L O2 all the time        OT Problem List: Decreased activity tolerance;Cardiopulmonary status limiting activity;Decreased knowledge of use of DME or AE;Obesity      OT Treatment/Interventions: Self-care/ADL training;Therapeutic exercise;Therapeutic activities;Energy conservation;DME and/or AE instruction;Patient/family education    OT Goals(Current goals can be found in the care plan section) Acute Rehab OT Goals Patient Stated Goal: go home OT Goal Formulation: With patient Time For Goal Achievement: 06/12/19 Potential to Achieve Goals: Good ADL Goals Pt Will Perform Lower Body Dressing: with modified independence;with adaptive equipment;sit to/from stand Pt Will Transfer to Toilet: with modified independence;ambulating(elevated commode, LRAD for amb) Additional ADL Goal #1: Pt will verbalize plan to implement at least 2 learned energy conservation strategies in order to maximize safety/indep with ADL and IADL  OT Frequency: Min 1X/week   Barriers to D/C:            Co-evaluation              AM-PAC OT "6 Clicks" Daily Activity     Outcome Measure Help from another person eating meals?: None Help from another person taking care of personal grooming?: None Help from another person toileting, which includes using toliet, bedpan, or urinal?: A Little Help from another person bathing (including washing, rinsing, drying)?: A Little Help from another person to put on and taking off regular upper body clothing?: None Help from another person to put on and taking off  regular lower body clothing?: A Little 6 Click Score: 21   End of Session    Activity Tolerance: Patient tolerated treatment well Patient left: in chair;with call bell/phone within reach;with chair alarm set;with SCD's reapplied  OT Visit Diagnosis: Other abnormalities of gait and mobility (R26.89)                Time: 7416-3845 OT Time Calculation (min): 25 min Charges:  OT General Charges $OT Visit: 1 Visit OT Evaluation $OT Eval Low Complexity: 1 Low OT Treatments $Self Care/Home Management : 8-22 mins  Jeni Salles, MPH, MS, OTR/L ascom [phone] 05/29/19, 3:40 PM

## 2019-05-29 NOTE — Evaluation (Signed)
Physical Therapy Evaluation Patient Details Name: Gary [name] MRN: [id number] DOB: 1955/11/14 Today's Date: 05/29/2019   History of Present Illness  63 yo male with a PMH of Chronic Diastolic CHF, prn Home O2, OSA.  He did need intubation for 2 days, at time of PT exam he is out of CCU and feeling much better.  Clinical Impression  Pt states he has felt well since intubation and was eager to see how he would do walking.  Ultimately he was able to participate with gait training of ~175 ft with FWW and 2L O2.  Vitals remained stable and he had no LOBs or significant safety concerns.  He showed good strength and mobility, should be able to return home with HHPT when medically cleared for d/c.      Follow Up Recommendations Home health PT    Equipment Recommendations  (states he has a walker, may need a new SPC)    Recommendations for Other Services       Precautions / Restrictions Restrictions Weight Bearing Restrictions: No      Mobility  Bed Mobility Overal bed mobility: Modified Independent             General bed mobility comments: Pt was able to get to EOB w/o assist or hesitation  Transfers Overall transfer level: Modified independent Equipment used: Rolling walker (2 wheeled)             General transfer comment: Pt needed bed raised 2-3" but was able to rise w/o direct assist, very bent at waist to get to standing  Ambulation/Gait Ambulation/Gait assistance: Modified independent (Device/Increase time) Gait Distance (Feet): 175 Feet Assistive device: Rolling walker (2 wheeled)       General Gait Details: Pt is able to ambulate relatively well with minimal reliance on the walker.  On 2 L t/o the effort, sats remained in the 90s, no overt LOBs.  Stairs            Wheelchair Mobility    Modified Rankin (Stroke Patients Only)       Balance Overall balance assessment: Modified Independent                                            Pertinent Vitals/Pain Pain Assessment: No/denies pain    Home Living Family/patient expects to be discharged to:: Private residence Living Arrangements: Spouse/significant other Available Help at Discharge: Family;Available PRN/intermittently Type of Home: Apartment Home Access: (reports he does have 1 step to get onto landing)     Home Layout: One level Home Equipment: Cane - quad(states he lost his [facility])      Prior Function Level of Independence: Independent         Comments: Pt reports that he does not always need AD, able to do most activities w/o assist, minimally out of the home     Hand Dominance        Extremity/Trunk Assessment   Upper Extremity Assessment Upper Extremity Assessment: Overall WFL for tasks assessed    Lower Extremity Assessment Lower Extremity Assessment: Overall WFL for tasks assessed       Communication   Communication: No difficulties  Cognition Arousal/Alertness: Awake/alert Behavior During Therapy: WFL for tasks assessed/performed Overall Cognitive Status: Within Functional Limits for tasks assessed  General Comments General comments (skin integrity, edema, etc.): Pt reports that he does not feel weak or too unsteady    Exercises     Assessment/Plan    PT Assessment Patient needs continued PT services  PT Problem List Decreased strength;Decreased range of motion;Decreased activity tolerance;Decreased balance;Decreased mobility;Decreased knowledge of use of DME;Decreased safety awareness       PT Treatment Interventions DME instruction;Gait training;Stair training;Functional mobility training;Therapeutic activities;Therapeutic exercise;Balance training;Neuromuscular re-education;Patient/family education    PT Goals (Current goals can be found in the Care Plan section)  Acute Rehab PT Goals Patient Stated Goal: go home PT Goal Formulation: With patient Time For  Goal Achievement: 06/12/19 Potential to Achieve Goals: Good    Frequency Min 2X/week   Barriers to discharge        Co-evaluation               AM-PAC PT "6 Clicks" Mobility  Outcome Measure Help needed turning from your back to your side while in a flat bed without using bedrails?: None Help needed moving from lying on your back to sitting on the side of a flat bed without using bedrails?: None Help needed moving to and from a bed to a chair (including a wheelchair)?: None Help needed standing up from a chair using your arms (e.g., wheelchair or bedside chair)?: A Little Help needed to walk in hospital room?: A Little Help needed climbing 3-5 steps with a railing? : A Little 6 Click Score: 21    End of Session Equipment Utilized During Treatment: Gait belt Activity Tolerance: Patient tolerated treatment well Patient left: with chair alarm set;with call bell/phone within reach Nurse Communication: Mobility status PT Visit Diagnosis: Muscle weakness (generalized) (M62.81);Difficulty in walking, not elsewhere classified (R26.2)    Time: 1594-7076 PT Time Calculation (min) (ACUTE ONLY): 24 min   Charges:   PT Evaluation $PT Eval Low Complexity: 1 Low PT Treatments $Gait Training: 8-22 mins        Kreg Shropshire, DPT 05/29/2019, 3:08 PM

## 2019-05-29 NOTE — Consult Note (Signed)
PHARMACY CONSULT NOTE - FOLLOW UP  Pharmacy Consult for Electrolyte Monitoring and Replacement   Recent Labs: Potassium (mmol/L)  Date Value  05/29/2019 3.6  04/15/2014 3.9   Magnesium (mg/dL)  Date Value  05/29/2019 2.1  08/21/2012 1.9   Calcium (mg/dL)  Date Value  05/29/2019 8.1 (L)   Calcium, Total (mg/dL)  Date Value  04/15/2014 8.3 (L)   Albumin (g/dL)  Date Value  05/26/2019 3.5  04/15/2014 2.9 (L)   Phosphorus (mg/dL)  Date Value  05/29/2019 4.1   Sodium (mmol/L)  Date Value  05/29/2019 140  04/15/2014 140     Assessment: 64 year old male admitted with acute on chronic hypercapnic hypoxic respiratory failure and dCHF exacerbation.  He has PMH for HTN, obesity, and HFrEF. Patient now extubated; continuing on furosemide 40mg  IV BID.   1.  Electrolytes Will continue to replace orally in the setting of CHF/diuresis.   Continue KCL 83meq daily  Will replace for goal potassium ~ 4, goal magnesium ~2, and goal phos ~3.   Electrolytes with am labs.   Pharmacy will continue to monitor and adjust per consult - if labs stable again tomorrow consider signing off.   Lu Duffel, PharmD, BCPS Clinical Pharmacist 05/29/2019 12:55 PM

## 2019-05-30 ENCOUNTER — Inpatient Hospital Stay: Payer: Medicaid Other

## 2019-05-30 LAB — BASIC METABOLIC PANEL
Anion gap: 8 (ref 5–15)
BUN: 24 mg/dL — ABNORMAL HIGH (ref 8–23)
CO2: 30 mmol/L (ref 22–32)
Calcium: 8.4 mg/dL — ABNORMAL LOW (ref 8.9–10.3)
Chloride: 102 mmol/L (ref 98–111)
Creatinine, Ser: 1 mg/dL (ref 0.61–1.24)
GFR calc Af Amer: >60 mL/min (ref >60)
GFR calc non Af Amer: >60 mL/min (ref >60)
Glucose, Bld: 103 mg/dL — ABNORMAL HIGH (ref 70–99)
Potassium: 4 mmol/L (ref 3.5–5.1)
Sodium: 140 mmol/L (ref 135–145)

## 2019-05-30 LAB — PHOSPHORUS: Phosphorus: 3.6 mg/dL (ref 2.5–4.6)

## 2019-05-30 LAB — MAGNESIUM: Magnesium: 2.2 mg/dL (ref 1.7–2.4)
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
## 2019-05-30 NOTE — Consult Note (Signed)
PHARMACY CONSULT NOTE - FOLLOW UP  Pharmacy Consult for Electrolyte Monitoring and Replacement   Recent Labs: Potassium (mmol/L)  Date Value  05/30/2019 4.0  04/15/2014 3.9   Magnesium (mg/dL)  Date Value  05/30/2019 2.2  08/21/2012 1.9   Calcium (mg/dL)  Date Value  05/30/2019 8.4 (L)   Calcium, Total (mg/dL)  Date Value  04/15/2014 8.3 (L)   Albumin (g/dL)  Date Value  05/26/2019 3.5  04/15/2014 2.9 (L)   Phosphorus (mg/dL)  Date Value  05/30/2019 3.6   Sodium (mmol/L)  Date Value  05/30/2019 140  04/15/2014 140     Assessment: 64 year old male admitted with acute on chronic hypercapnic hypoxic respiratory failure and dCHF exacerbation.  He has PMH for HTN, obesity, and HFrEF. Patient now extubated; continuing on furosemide 40mg  IV BID.   1.  Electrolytes  Will replace for goal potassium ~ 4, goal magnesium ~2, and goal phos ~3.   No replacement needed at this time.   Electrolytes with am labs.   Pharmacy will continue to monitor and adjust per consult.   Oswald Hillock, PharmD, BCPS Clinical Pharmacist 05/30/2019 7:47 AM

## 2019-05-30 NOTE — Progress Notes (Addendum)
CCMD noted 11 beat run of VT this morning.  Paged MD to let him know.

## 2019-05-31 LAB — BASIC METABOLIC PANEL
Anion gap: 7 (ref 5–15)
BUN: 29 mg/dL — ABNORMAL HIGH (ref 8–23)
CO2: 30 mmol/L (ref 22–32)
Calcium: 8.1 mg/dL — ABNORMAL LOW (ref 8.9–10.3)
Chloride: 101 mmol/L (ref 98–111)
Creatinine, Ser: 1.12 mg/dL (ref 0.61–1.24)
GFR calc Af Amer: >60 mL/min (ref >60)
GFR calc non Af Amer: >60 mL/min (ref >60)
Glucose, Bld: 99 mg/dL (ref 70–99)
Potassium: 4.1 mmol/L (ref 3.5–5.1)
Sodium: 138 mmol/L (ref 135–145)

## 2019-05-31 MED ORDER — ENOXAPARIN SODIUM 40 MG/0.4ML ~~LOC~~ SOLN
40.0000 mg | SUBCUTANEOUS | Status: DC
  Administered 2019-05-31: 22:00:00 40 mg via SUBCUTANEOUS
  Filled 2019-05-31: qty 0.4

## 2019-05-31 MED ORDER — SPIRONOLACTONE 25 MG PO TABS
25.0000 mg | ORAL_TABLET | Freq: Every day | ORAL | Status: DC
  Administered 2019-05-31 – 2019-06-01 (×2): 25 mg via ORAL
  Filled 2019-05-31 (×2): qty 1

## 2019-05-31 MED ORDER — FUROSEMIDE 40 MG PO TABS
80.0000 mg | ORAL_TABLET | Freq: Two times a day (BID) | ORAL | Status: DC
  Administered 2019-06-01: 80 mg via ORAL
  Filled 2019-05-31: qty 2

## 2019-05-31 MED ORDER — ENOXAPARIN SODIUM 40 MG/0.4ML ~~LOC~~ SOLN: 40.0000 mg | Freq: Two times a day (BID) | SUBCUTANEOUS | Status: DC

## 2019-05-31 NOTE — Consult Note (Signed)
PHARMACY CONSULT NOTE - FOLLOW UP  Pharmacy Consult for Electrolyte Monitoring and Replacement   Recent Labs: Potassium (mmol/L)  Date Value  05/31/2019 4.1  04/15/2014 3.9   Magnesium (mg/dL)  Date Value  05/30/2019 2.2  08/21/2012 1.9   Calcium (mg/dL)  Date Value  05/31/2019 8.1 (L)   Calcium, Total (mg/dL)  Date Value  04/15/2014 8.3 (L)   Albumin (g/dL)  Date Value  05/26/2019 3.5  04/15/2014 2.9 (L)   Phosphorus (mg/dL)  Date Value  05/30/2019 3.6   Sodium (mmol/L)  Date Value  05/31/2019 138  04/15/2014 140     Assessment: 64 year old male admitted with acute on chronic hypercapnic hypoxic respiratory failure and dCHF exacerbation.  He has PMH for HTN, obesity, and HFrEF. Patient now extubated; continuing on furosemide 40mg  IV BID.   1.  Electrolytes  Will replace for goal potassium ~ 4, goal magnesium ~2, and goal phos ~3.   No replacement needed at this time. Pt transferred to the floor from the ICU.  Electrolytes stable. Pharmacy will sign off. Re-consult if needed.   Oswald Hillock, PharmD, BCPS Clinical Pharmacist 05/31/2019 8:49 AM

## 2019-05-31 NOTE — Progress Notes (Signed)
Consult was placed to IV Therapy for a new iv;  Found pt sitting in the recliner, having breakfast, with his IV laying on the top of his arm;  gauze placed over the site;  Pt wants iv to be restarted while he is in the recliner; explained that he will have to be in the bed, especially if the ultrasound will be used;  Unable to reach RN by phone; message left with unit secretary to have the pt get back to bed and iv will be started later.
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
## 2019-05-31 NOTE — Progress Notes (Signed)
PROGRESS NOTE    Gary [name]  LZJ:[id number] DOB: May 06, 1955 DOA: 05/26/2019 PCP: [facility]      Brief Narrative:  Gary [name] is a 64 y.o. M with morbid obesity, HTN, OSA not on CPAP, CHF noncompliant with medications who presented with weight gain, SOB.     Assessment & Plan:  Acute on chronic diastolic CHF Acute on chronic hypoxic and hypercapnic respiratory failure due to CHF flare, OSA, and OHS  Net negative only 460cc yesterday, 6.7 L on admission.  Creatinine stable, potassium normal. -Continue furosemide 40 mg IV twice a day resume oral diuretic tomorrow -K supplement -Strict I/Os, daily weights, telemetry  -Daily monitoring renal function     Hypertension Blood pressure controlled -Continue aspirin -Continue furosemide -Restart spironolactone  Sleep apnea -CPAP at night  Thrombocytopenia  GERD -Continue pantoprazole        Disposition: The patient was admitted with severe acute on chronic CHF requiring intubation. He still is fluid overloaded on exam, dyspneic with minimal exertion, requiring ongoing IV Lasix. I will discharge when his edema has improved, he is weaned down to no oxygen or minimal oxygen, and his symptoms are resolved.        MDM: The below labs and imaging reports were reviewed and summarized above.  Medication management as above.  This was a severe exacerbation of his chronic disease.   DVT prophylaxis: Lovenox Code Status: Full code Family Communication: Patient deferred    Consultants:   Critical care  Procedures:   2/9 intubation  2/11 extubation  Antimicrobials:      Culture data:              Subjective: Patient is still somewhat swollen, able to get out of bed, but dyspneic with walking to the bathroom.  No confusion, fever, sputum.  Objective: Vitals:   05/30/19 1924 05/31/19 0420 05/31/19 0735 05/31/19 1135  BP: 103/73 99/77 109/72 (!) 132/91  Pulse: 71 78  86 75  Resp: 20 20 18 18   Temp: 98 F (36.7 C) 98 F (36.7 C) 98.2 F (36.8 C) 98.8 F (37.1 C)  TempSrc: Oral Oral Oral Oral  SpO2: 92% 100% 98% 97%  Weight:      Height:        Intake/Output Summary (Last 24 hours) at 05/31/2019 1346 Last data filed at 05/31/2019 0930 Gross per 24 hour  Intake 600 ml  Output 800 ml  Net -200 ml   Filed Weights   05/26/19 1424  Weight: (!) 145.2 kg    Examination: Gary appearance: Obese adult male, alert and in no acute distress.  Lying in bed.  Appears tired. HEENT: Anicteric, conjunctiva pink, lids and lashes normal. No nasal deformity, discharge, epistaxis.  Lips lips dry, hearing normal.   Skin: Warm and dry.  No jaundice.  No suspicious rashes or lesions. Cardiac: RRR, nl S1-S2, no murmurs appreciated.  Capillary refill is brisk.  JVP not visible due to body habitus.  1+ bilateral no LE edema.  Radial pulses 2+ and symmetric. Respiratory: Normal respiratory rate and rhythm.  Diminished at bilateral bases, no wheezing. Abdomen: Abdomen soft.  No TTP or guarding. No ascites, distension, hepatosplenomegaly.   MSK: No deformities or effusions. Neuro: Awake and alert.  EOMI, moves all extremities. Speech fluent.    Psych: Sensorium intact and responding to questions, attention normal. Affect normal.  Judgment and insight appear normal.    Data Reviewed: I have personally reviewed following labs and imaging studies:  CBC: Recent Labs  Lab 05/26/19 1430 05/27/19 0446 05/28/19 0409 05/29/19 0433  WBC 6.9 6.9 6.0 6.0  NEUTROABS  --   --  4.1 4.1  HGB 14.8 14.3 14.7 15.3  HCT 57.1* 52.1* 53.0* 55.2*  MCV 82.8 78.3* 76.8* 77.4*  PLT 112* 100* 99* 161*   Basic Metabolic Panel: Recent Labs  Lab 05/27/19 0446 05/27/19 0446 05/27/19 1759 05/28/19 0409 05/29/19 0433 05/30/19 0443 05/31/19 0429  NA 141  --   --  140 140 140 138  K 3.7   < > 3.5 3.7 3.6 4.0 4.1  CL 101  --   --  100 100 102 101  CO2 32  --   --  30 33* 30 30    GLUCOSE 66*  --   --  85 79 103* 99  BUN 23  --   --  32* 22 24* 29*  CREATININE 1.00  --   --  1.09 0.96 1.00 1.12  CALCIUM 8.1*  --   --  7.9* 8.1* 8.4* 8.1*  MG 2.3  --   --  2.2 2.1 2.2  --   PHOS 1.1*  --  2.4* 4.0 4.1 3.6  --    < > = values in this interval not displayed.   GFR: Estimated Creatinine Clearance: 106.6 mL/min (by C-G formula based on SCr of 1.12 mg/dL). Liver Function Tests: Recent Labs  Lab 05/26/19 1430  AST 18  ALT 16  ALKPHOS 102  BILITOT 1.0  PROT 8.1  ALBUMIN 3.5   Recent Labs  Lab 05/26/19 1430  LIPASE 28   No results for input(s): AMMONIA in the last 168 hours. Coagulation Profile: No results for input(s): INR, PROTIME in the last 168 hours. Cardiac Enzymes: No results for input(s): CKTOTAL, CKMB, CKMBINDEX, TROPONINI in the last 168 hours. BNP (last 3 results) No results for input(s): PROBNP in the last 8760 hours. HbA1C: No results for input(s): HGBA1C in the last 72 hours. CBG: Recent Labs  Lab 05/28/19 1110 05/28/19 1614 05/28/19 1934 05/28/19 2348 05/29/19 0720  GLUCAP 88 87 118* 101* 81   Lipid Profile: No results for input(s): CHOL, HDL, LDLCALC, TRIG, CHOLHDL, LDLDIRECT in the last 72 hours. Thyroid Function Tests: No results for input(s): TSH, T4TOTAL, FREET4, T3FREE, THYROIDAB in the last 72 hours. Anemia Panel: No results for input(s): VITAMINB12, FOLATE, FERRITIN, TIBC, IRON, RETICCTPCT in the last 72 hours. Urine analysis:    Component Value Date/Time   COLORURINE Yellow 04/15/2014 1240   APPEARANCEUR Clear 04/15/2014 1240   LABSPEC 1.013 04/15/2014 1240   PHURINE 7.0 04/15/2014 1240   GLUCOSEU Negative 04/15/2014 1240   HGBUR Negative 04/15/2014 1240   BILIRUBINUR Negative 04/15/2014 1240   KETONESUR Negative 04/15/2014 1240   PROTEINUR Negative 04/15/2014 1240   NITRITE Negative 04/15/2014 1240   LEUKOCYTESUR Negative 04/15/2014 1240   Sepsis Labs: @LABRCNTIP (procalcitonin:4,lacticacidven:4)  ) Recent  Results (from the past 240 hour(s))  Respiratory Panel by RT PCR (Flu A&B, Covid) - Nasopharyngeal Swab     Status: None   Collection Time: 05/26/19  6:29 PM   Specimen: Nasopharyngeal Swab  Result Value Ref Range Status   SARS Coronavirus 2 by RT PCR NEGATIVE NEGATIVE Final    Comment: (NOTE) SARS-CoV-2 target nucleic acids are NOT DETECTED. The SARS-CoV-2 RNA is generally detectable in upper respiratoy specimens during the acute phase of infection. The lowest concentration of SARS-CoV-2 viral copies this assay can detect is 131 copies/mL. A negative result does not preclude  SARS-Cov-2 infection and should not be used as the sole basis for treatment or other patient management decisions. A negative result may occur with  improper specimen collection/handling, submission of specimen other than nasopharyngeal swab, presence of viral mutation(s) within the areas targeted by this assay, and inadequate number of viral copies (<131 copies/mL). A negative result must be combined with clinical observations, patient history, and epidemiological information. The expected result is Negative. Fact Sheet for Patients:  PinkCheek.be Fact Sheet for Healthcare Providers:  GravelBags.it This test is not yet ap proved or cleared by the [place] FDA and  has been authorized for detection and/or diagnosis of SARS-CoV-2 by FDA under an Emergency Use Authorization (EUA). This EUA will remain  in effect (meaning this test can be used) for the duration of the COVID-19 declaration under Section 564(b)(1) of the Act, 21 U.S.C. section 360bbb-3(b)(1), unless the authorization is terminated or revoked sooner.    Influenza A by PCR NEGATIVE NEGATIVE Final   Influenza B by PCR NEGATIVE NEGATIVE Final    Comment: (NOTE) The Xpert Xpress SARS-CoV-2/FLU/RSV assay is intended as an aid in  the diagnosis of influenza from Nasopharyngeal swab specimens  and  should not be used as a sole basis for treatment. Nasal washings and  aspirates are unacceptable for Xpert Xpress SARS-CoV-2/FLU/RSV  testing. Fact Sheet for Patients: PinkCheek.be Fact Sheet for Healthcare Providers: GravelBags.it This test is not yet approved or cleared by the [place] FDA and  has been authorized for detection and/or diagnosis of SARS-CoV-2 by  FDA under an Emergency Use Authorization (EUA). This EUA will remain  in effect (meaning this test can be used) for the duration of the  Covid-19 declaration under Section 564(b)(1) of the Act, 21  U.S.C. section 360bbb-3(b)(1), unless the authorization is  terminated or revoked. Performed at [facility], [address] [place], [place] [address]   MRSA PCR Screening     Status: None   Collection Time: 05/26/19  9:25 PM   Specimen: Nasal Mucosa; Nasopharyngeal  Result Value Ref Range Status   MRSA by PCR NEGATIVE NEGATIVE Final    Comment:        The GeneXpert MRSA Assay (FDA approved for NASAL specimens only), is one component of a comprehensive MRSA colonization surveillance program. It is not intended to diagnose MRSA infection nor to guide or monitor treatment for MRSA infections. Performed at [facility], [address] [place], [place] [address]          Radiology Studies: DG Chest [place] 1 View  Result Date: 05/30/2019 CLINICAL DATA:  Hypoxia EXAM: PORTABLE CHEST 1 VIEW COMPARISON:  May 28, 2019 FINDINGS: The mediastinal contour is normal. Heart size is enlarged. Mild increased pulmonary interstitium is identified bilaterally. There is no focal pneumonia or pleural effusion. The bony structures are stable. IMPRESSION: Mild congestive heart failure. Electronically Signed   By: Abelardo Diesel M.D.   On: 05/30/2019 10:08        Scheduled Meds: . aspirin  81 mg Oral Daily  . Chlorhexidine Gluconate Cloth  6  each Topical Daily  . enoxaparin (LOVENOX) injection  40 mg Subcutaneous Q24H  . furosemide  40 mg Intravenous BID  . [START ON 06/01/2019] furosemide  80 mg Oral BID  . mouth rinse  15 mL Mouth Rinse BID  . pantoprazole  40 mg Oral Daily  . Ensure Max Protein  11 oz Oral BID BM  . spironolactone  25 mg Oral Daily  Continuous Infusions:   LOS: 5 days    Time spent: 35 minutes    Edwin Dada, MD [facility] 05/31/2019, 1:46 PM     Please page though [place] or Epic secure chat:  For [employer], Adult nurse

## 2019-06-01 LAB — CBC
HCT: 56 % — ABNORMAL HIGH (ref 39.0–52.0)
Hemoglobin: 15.3 g/dL (ref 13.0–17.0)
MCH: 21.6 pg — ABNORMAL LOW (ref 26.0–34.0)
MCHC: 27.3 g/dL — ABNORMAL LOW (ref 30.0–36.0)
MCV: 79.1 fL — ABNORMAL LOW (ref 80.0–100.0)
Platelets: 120 10*3/uL — ABNORMAL LOW (ref 150–400)
RBC: 7.08 MIL/uL — ABNORMAL HIGH (ref 4.22–5.81)
RDW: 20.5 % — ABNORMAL HIGH (ref 11.5–15.5)
WBC: 6.7 10*3/uL (ref 4.0–10.5)
nRBC: 0 % (ref 0.0–0.2)

## 2019-06-01 LAB — BASIC METABOLIC PANEL
Anion gap: 7 (ref 5–15)
BUN: 33 mg/dL — ABNORMAL HIGH (ref 8–23)
CO2: 30 mmol/L (ref 22–32)
Calcium: 8.4 mg/dL — ABNORMAL LOW (ref 8.9–10.3)
Chloride: 100 mmol/L (ref 98–111)
Creatinine, Ser: 1.07 mg/dL (ref 0.61–1.24)
GFR calc Af Amer: >60 mL/min (ref >60)
GFR calc non Af Amer: >60 mL/min (ref >60)
Glucose, Bld: 90 mg/dL (ref 70–99)
Potassium: 4.5 mmol/L (ref 3.5–5.1)
Sodium: 137 mmol/L (ref 135–145)

## 2019-06-01 NOTE — Progress Notes (Signed)
Patient very agitated this morning about missing sneakers. This RN called ICU and secretary spoke to the ED. Sneakers are not able to be found. Patient proceeded to yell while using foul language and stated "I will not leave until I get my sneakers back!". Charge nurse and [facility] have been notified about missing belongings.

## 2019-06-01 NOTE — Progress Notes (Signed)
Gary Gandy RN reviewed discharge paperwork with patient. Patient was given phone number to follow about missing sneakers. Patient will be driven home by sister.

## 2019-06-01 NOTE — Discharge Summary (Signed)
Physician Discharge Summary  Gary [name] UYQ:[id number] DOB: 1956-02-03 DOA: 05/26/2019  PCP: [facility] [place] date: 05/26/2019 Discharge date: 06/01/2019  Admitted From: Home  Disposition:  Home with Home health   Recommendations for Outpatient Follow-up:  1. Follow up with CHF clinic in 4 days 2. Ms Jackelyn Hoehn: Please obtain BMP in 4 days 3. Follow up with Dr. Lanney Gins in 2-4 weeks       Home Health: PT/OT Equipment/Devices: Supplemental oxygen  Discharge Condition: Fair CODE STATUS: Full code Diet recommendation: Cardiac  Brief/Interim Summary: Gary [name] is a 64 y.o. M with morbid obesity, HTN, OSA not on CPAP, CHF noncompliant with medications who presented with weight gain, SOB.  Patient admitted on Lasix, deteriorated and required intubation.       PRINCIPAL HOSPITAL DIAGNOSIS: Acute respiratory failure due to CHF flare    Discharge Diagnoses:   Acute on chronic diastolic CHF Acute on chronic hypoxic and hypercapnic respiratory failure due to CHF flare, OSA, and OHS Patient admitted and started on IV Lasix.  He deteriorated and required intubation on 2/9.  Patient diuresed and extubated on 2/11.  Net negative over 7 L on admission.  Creatinine normalized, potassium stable.  Patient had run out of diuretic prior to admission, only recently restarted.  He was discharged on previous dose Lasix 80 mg twice daily, careful instructions on daily weights, sodium restriction.     Hypertension Blood pressure controlled off losartan. Continue aspirin and furosemide and spironolactone.  Hold losartan until CHF team follow-up.  Sleep apnea Continue CPAP at night  Thrombocytopenia  GERD          Discharge Instructions  Discharge Instructions    Diet - low sodium heart healthy   Complete by: As directed    Discharge instructions   Complete by: As directed    From Dr. Loleta Books: You were admitted for heart failure. You  were intubated briefly (put on a ventilator, a breathing machine) because it was so bad.  Thankfully, it got better with diuretics, like Lasix.  Continue your Lasix 80 mg (two tabs) twice daily Go see the Heart Failure clinic on 2/19 THIS WEEK (see the appointment date and time below) Have them check your lab work  Weigh yourself today when you get home  This is your DRY WEIGHT  Every day in the morning, when you get up, before you eat anything, weigh yourself in your underwear and write this down If your weight is EVER: 3lbs or more MORE than the day before  5lbs over your dry weight   Call the heart failure clinic THAT DAY for instructions, likely to double your Lasix or change to a different medicine   Your medicines should be: Aspirin 81 mg daily (this thins the blood to prevent heart attacks and strokes) Simvastatin 10 mg daily at night (This is a cholesterol medicine)  Furosemide 80 mg (two tabs) twice daily in the morning and mid-afternoon (this is a diuretic and blood pressure medicine) Spironolactone 25 mg daily (this is a diuretic and blood pressure medicine too)  FOR NOW, do not take your other blood pressure medicine losartan Here, your blood pressure has been on the low side.   Do not take this and ask the heart failure clinic at your follow up appointment later this week if you should restart it  Use your CPAP every night   Increase activity slowly   Complete by: As directed      Allergies as of  06/01/2019   No Known Allergies     Medication List    STOP taking these medications   losartan 50 MG tablet Commonly known as: COZAAR     TAKE these medications   aspirin 81 MG chewable tablet Chew 1 tablet (81 mg total) by mouth daily.   furosemide 40 MG tablet Commonly known as: LASIX Take 2 tablets (80 mg total) by mouth 2 (two) times daily.   simvastatin 10 MG tablet Commonly known as: Zocor Take 1 tablet (10 mg total) by mouth daily.    spironolactone 25 MG tablet Commonly known as: ALDACTONE Take 25 mg by mouth daily.      Follow-up Information    [facility] up on 06/05/2019.   Specialty: Cardiology Why: at 10:30am. Enter through the [facility] entrance Contact information: [address]       [name], MD. Schedule an appointment as soon as possible for a visit in 3 week(s).   Specialty: Pulmonary Disease Contact information: [place] [address] [phone]          No Known Allergies  Consultations:  Critical care   Procedures/Studies: DG Chest 1 View  Result Date: 05/26/2019 CLINICAL DATA:  Intubation EXAM: CHEST  1 VIEW COMPARISON:  05/26/2019 FINDINGS: Endotracheal tube 2.5 cm above the carina. NG tube enters stomach. Cardiomegaly with vascular congestion and bilateral airspace disease, worsening since prior study, likely edema. Suspect layering bilateral effusions. The right lateral chest not visualized. IMPRESSION: Cardiomegaly, vascular congestion and probable worsening pulmonary edema. Suspect layering effusions. Electronically Signed   By: Rolm Baptise M.D.   On: 05/26/2019 20:01   DG Abdomen 1 View  Result Date: 05/26/2019 CLINICAL DATA:  OG tube placement EXAM: ABDOMEN - 1 VIEW COMPARISON:  10/14/2018 FINDINGS: OG tube is in place with the tip in the stomach. Nonobstructive bowel gas pattern. IMPRESSION: OG tube tip in the stomach. Electronically Signed   By: Rolm Baptise M.D.   On: 05/26/2019 20:03   DG Chest Port 1 View  Result Date: 05/30/2019 CLINICAL DATA:  Hypoxia EXAM: PORTABLE CHEST 1 VIEW COMPARISON:  May 28, 2019 FINDINGS: The mediastinal contour is normal. Heart size is enlarged. Mild increased pulmonary interstitium is identified bilaterally. There is no focal pneumonia or pleural effusion. The bony structures are stable.  IMPRESSION: Mild congestive heart failure. Electronically Signed   By: Abelardo Diesel M.D.   On: 05/30/2019 10:08   DG Chest Port 1 View  Result Date: 05/28/2019 CLINICAL DATA:  Respiratory failure EXAM: PORTABLE CHEST 1 VIEW COMPARISON:  Radiograph 05/26/2019 FINDINGS: Endotracheal tube in the mid trachea, 4.6 cm from the carina. Transesophageal tube tip terminates below the level of imaging. Side port is difficult to visualize due to body habitus. There is stable cardiomegaly with vascular congestion and hazy opacity suggesting a combination of edema and atelectasis. Obscuration of the hemidiaphragms may reflect layering pleural fluid as well. No pneumothorax. No acute osseous or soft tissue abnormality. Degenerative changes are present in the imaged spine and shoulders. IMPRESSION: Stable cardiomegaly with vascular congestion and hazy opacity suggesting a combination of edema and atelectasis. Overall extent is similar to prior. Likely layering effusions. Electronically Signed   By: Lovena Le M.D.   On: 05/28/2019 04:58   DG Chest Portable 1 View  Result Date: 05/26/2019 CLINICAL DATA:  Shortness of breath EXAM: PORTABLE CHEST 1 VIEW COMPARISON:  November 06, 2018 FINDINGS: There is cardiomegaly with pulmonary venous hypertension. There is slight interstitial edema. No airspace opacity or pleural effusion. No adenopathy. No bone lesions. IMPRESSION: Cardiomegaly with pulmonary vascular congestion. Slight interstitial edema. There may be a degree of congestive heart failure. No airspace consolidation. No adenopathy appreciable. Electronically Signed   By: Lowella Grip III M.D.   On: 05/26/2019 16:30   ECHOCARDIOGRAM COMPLETE  Result Date: 05/27/2019    ECHOCARDIOGRAM REPORT   Patient Name:   Gary [name] Date of Exam: 05/27/2019 Medical Rec #:  [id number]      Height:       75.0 in Accession #:    [id number]     Weight:       320.0 lb Date of Birth:  12/16/1955      BSA:          2.68 m Patient Age:     7 years       BP:           137/84 mmHg Patient Gender: M              HR:           76 bpm. Exam Location:  [facility] Procedure: 2D Echo, Cardiac Doppler and Color Doppler Indications:     CHF- acute diastolic 573.22  History:         Patient has prior history of Echocardiogram examinations, most                  recent 05/29/2017. CHF; Risk Factors:Hypertension and Sleep                  Apnea.  Sonographer:     Sherrie Sport RDCS (AE) Referring Phys:  [id number] [name] Diagnosing Phys: Kate Sable MD  Sonographer Comments: No apical window and echo performed with patient supine and on artificial respirator. IMPRESSIONS  1. Left ventricular ejection fraction, by estimation, is 55 to 60%. The left ventricle has normal function. The left ventrical has no regional wall motion abnormalities. There is mildly increased left ventricular hypertrophy. Left ventricular diastolic function could not be evaluated.  2. Right ventricular systolic function was not well visualized. The right ventricular size is not well visualized.  3. No left atrial/left atrial appendage thrombus was detected.  4. The mitral valve is grossly normal. no evidence of mitral valve regurgitation.  5. The aortic valve is tricuspid. Aortic valve regurgitation is not visualized.  6. Pulmonic valve regurgitation not assessed. FINDINGS  Left Ventricle: Left ventricular ejection fraction, by estimation, is 55 to 60%. The left ventricle has normal function. The left ventricle has no regional wall motion abnormalities. The left ventricular internal cavity size was normal in size. There is  mildly increased left ventricular hypertrophy. Left ventricular diastolic function could not be evaluated. Right Ventricle: The right ventricular size is not well visualized. Right vetricular wall thickness was not assessed. Right ventricular systolic function was not well visualized. Left Atrium: Left atrial size was normal in size. Right Atrium: Right atrial size was  not well visualized. Pericardium: There is no evidence of pericardial effusion. Mitral Valve: The mitral valve is grossly normal. No evidence of mitral valve regurgitation. Tricuspid Valve: The tricuspid valve is not well visualized. Tricuspid valve regurgitation is not demonstrated. Aortic Valve: The aortic valve is tricuspid. Aortic valve regurgitation is not visualized. Pulmonic Valve: The pulmonic valve was not well visualized. Pulmonic valve regurgitation not assessed. Aorta: The aortic root is normal in size  and structure. Venous: IVC assessment for right atrial pressure unable to be performed due to mechanical ventilation. IAS/Shunts: The interatrial septum was not assessed.  LEFT VENTRICLE PLAX 2D LVIDd:         6.11 cm LVIDs:         4.02 cm LV PW:         1.14 cm LV IVS:        1.23 cm LVOT diam:     2.30 cm LV SV Index:   41.31 LVOT Area:     4.15 cm  LEFT ATRIUM         Index LA diam:    2.90 cm 1.08 cm/m                        PULMONIC VALVE AORTA                 PV Vmax:        0.81 m/s Ao Root diam: 3.20 cm PV Peak grad:   2.6 mmHg                       RVOT Peak grad: 2 mmHg   SHUNTS Systemic Diam: 2.30 cm Kate Sable MD Electronically signed by Kate Sable MD Signature Date/Time: 05/27/2019/3:33:55 PM    Final        Subjective: Patient is feeling well, no orthopnea, no leg swelling.  He has at baseline in terms of dyspnea on ambulation, which is NYHA class III heart failure symptoms.  He has no confusion, cough, sputum.  Discharge Exam: Vitals:   06/01/19 0358 06/01/19 0809  BP: 112/75 110/79  Pulse: 79 80  Resp: 20 18  Temp: 97.9 F (36.6 C) 97.8 F (36.6 C)  SpO2: 94% 97%   Vitals:   05/31/19 2100 06/01/19 0035 06/01/19 0358 06/01/19 0809  BP: 120/80  112/75 110/79  Pulse: 70 76 79 80  Resp: 20 20 20 18   Temp: 98 F (36.7 C)  97.9 F (36.6 C) 97.8 F (36.6 C)  TempSrc: Oral  Oral Oral  SpO2: 97% 93% 94% 97%  Weight:      Height:        General: Pt  is alert, awake, not in acute distress Cardiovascular: RRR, nl S1-S2, no murmurs appreciated.   No LE edema.   Respiratory: Normal respiratory rate and rhythm.  CTAB without rales or wheezes. Abdominal: Abdomen soft and non-tender.  No distension or HSM.   Neuro/Psych: Strength symmetric in upper and lower extremities.  Judgment and insight appear normal.   The results of significant diagnostics from this hospitalization (including imaging, microbiology, ancillary and laboratory) are listed below for reference.     Microbiology: Recent Results (from the past 240 hour(s))  Respiratory Panel by RT PCR (Flu A&B, Covid) - Nasopharyngeal Swab     Status: None   Collection Time: 05/26/19  6:29 PM   Specimen: Nasopharyngeal Swab  Result Value Ref Range Status   SARS Coronavirus 2 by RT PCR NEGATIVE NEGATIVE Final    Comment: (NOTE) SARS-CoV-2 target nucleic acids are NOT DETECTED. The SARS-CoV-2 RNA is generally detectable in upper respiratoy specimens during the acute phase of infection. The lowest concentration of SARS-CoV-2 viral copies this assay can detect is 131 copies/mL. A negative result does not preclude SARS-Cov-2 infection and should not be used as the sole basis for treatment or other patient management decisions. A negative result may occur with  improper specimen collection/handling, submission of specimen other than nasopharyngeal swab, presence of viral mutation(s) within the areas targeted by this assay, and inadequate number of viral copies (<131 copies/mL). A negative result must be combined with clinical observations, patient history, and epidemiological information. The expected result is Negative. Fact Sheet for Patients:  PinkCheek.be Fact Sheet for Healthcare Providers:  GravelBags.it This test is not yet ap proved or cleared by the [place] FDA and  has been authorized for detection and/or diagnosis  of SARS-CoV-2 by FDA under an Emergency Use Authorization (EUA). This EUA will remain  in effect (meaning this test can be used) for the duration of the COVID-19 declaration under Section 564(b)(1) of the Act, 21 U.S.C. section 360bbb-3(b)(1), unless the authorization is terminated or revoked sooner.    Influenza A by PCR NEGATIVE NEGATIVE Final   Influenza B by PCR NEGATIVE NEGATIVE Final    Comment: (NOTE) The Xpert Xpress SARS-CoV-2/FLU/RSV assay is intended as an aid in  the diagnosis of influenza from Nasopharyngeal swab specimens and  should not be used as a sole basis for treatment. Nasal washings and  aspirates are unacceptable for Xpert Xpress SARS-CoV-2/FLU/RSV  testing. Fact Sheet for Patients: PinkCheek.be Fact Sheet for Healthcare Providers: GravelBags.it This test is not yet approved or cleared by the [place] FDA and  has been authorized for detection and/or diagnosis of SARS-CoV-2 by  FDA under an Emergency Use Authorization (EUA). This EUA will remain  in effect (meaning this test can be used) for the duration of the  Covid-19 declaration under Section 564(b)(1) of the Act, 21  U.S.C. section 360bbb-3(b)(1), unless the authorization is  terminated or revoked. Performed at [facility], [address] [place], [place] [address]   MRSA PCR Screening     Status: None   Collection Time: 05/26/19  9:25 PM   Specimen: Nasal Mucosa; Nasopharyngeal  Result Value Ref Range Status   MRSA by PCR NEGATIVE NEGATIVE Final    Comment:        The GeneXpert MRSA Assay (FDA approved for NASAL specimens only), is one component of a comprehensive MRSA colonization surveillance program. It is not intended to diagnose MRSA infection nor to guide or monitor treatment for MRSA infections. Performed at [facility], [address] [place],  [address]      Labs: BNP (last 3  results) Recent Labs    06/24/18 1648 10/13/18 2058 05/26/19 1430  BNP 251.0* 1,786.0* 3,016.0*   Basic Metabolic Panel: Recent Labs  Lab 05/27/19 0446 05/27/19 0446 05/27/19 1759 05/28/19 0409 05/29/19 0433 05/30/19 0443 05/31/19 0429 06/01/19 0611  NA 141   < >  --  140 140 140 138 137  K 3.7   < > 3.5 3.7 3.6 4.0 4.1 4.5  CL 101   < >  --  100 100 102 101 100  CO2 32   < >  --  30 33* 30 30 30   GLUCOSE 66*   < >  --  85 79 103* 99 90  BUN 23   < >  --  32* 22 24* 29* 33*  CREATININE 1.00   < >  --  1.09 0.96 1.00 1.12 1.07  CALCIUM 8.1*   < >  --  7.9* 8.1* 8.4* 8.1* 8.4*  MG 2.3  --   --  2.2 2.1 2.2  --   --   PHOS 1.1*  --  2.4* 4.0 4.1 3.6  --   --    < > =  values in this interval not displayed.   Liver Function Tests: Recent Labs  Lab 05/26/19 1430  AST 18  ALT 16  ALKPHOS 102  BILITOT 1.0  PROT 8.1  ALBUMIN 3.5   Recent Labs  Lab 05/26/19 1430  LIPASE 28   No results for input(s): AMMONIA in the last 168 hours. CBC: Recent Labs  Lab 05/26/19 1430 05/27/19 0446 05/28/19 0409 05/29/19 0433 06/01/19 0611  WBC 6.9 6.9 6.0 6.0 6.7  NEUTROABS  --   --  4.1 4.1  --   HGB 14.8 14.3 14.7 15.3 15.3  HCT 57.1* 52.1* 53.0* 55.2* 56.0*  MCV 82.8 78.3* 76.8* 77.4* 79.1*  PLT 112* 100* 99* 102* 120*   Cardiac Enzymes: No results for input(s): CKTOTAL, CKMB, CKMBINDEX, TROPONINI in the last 168 hours. BNP: Invalid input(s): POCBNP CBG: Recent Labs  Lab 05/28/19 1110 05/28/19 1614 05/28/19 1934 05/28/19 2348 05/29/19 0720  GLUCAP 88 87 118* 101* 81   D-Dimer No results for input(s): DDIMER in the last 72 hours. Hgb A1c No results for input(s): HGBA1C in the last 72 hours. Lipid Profile No results for input(s): CHOL, HDL, LDLCALC, TRIG, CHOLHDL, LDLDIRECT in the last 72 hours. Thyroid function studies No results for input(s): TSH, T4TOTAL, T3FREE, THYROIDAB in the last 72 hours.  Invalid input(s): FREET3 Anemia work up No results for  input(s): VITAMINB12, FOLATE, FERRITIN, TIBC, IRON, RETICCTPCT in the last 72 hours. Urinalysis    Component Value Date/Time   COLORURINE Yellow 04/15/2014 1240   APPEARANCEUR Clear 04/15/2014 1240   LABSPEC 1.013 04/15/2014 1240   PHURINE 7.0 04/15/2014 1240   GLUCOSEU Negative 04/15/2014 1240   HGBUR Negative 04/15/2014 1240   BILIRUBINUR Negative 04/15/2014 1240   KETONESUR Negative 04/15/2014 1240   PROTEINUR Negative 04/15/2014 1240   NITRITE Negative 04/15/2014 1240   LEUKOCYTESUR Negative 04/15/2014 1240   Sepsis Labs Invalid input(s): PROCALCITONIN,  WBC,  LACTICIDVEN Microbiology Recent Results (from the past 240 hour(s))  Respiratory Panel by RT PCR (Flu A&B, Covid) - Nasopharyngeal Swab     Status: None   Collection Time: 05/26/19  6:29 PM   Specimen: Nasopharyngeal Swab  Result Value Ref Range Status   SARS Coronavirus 2 by RT PCR NEGATIVE NEGATIVE Final    Comment: (NOTE) SARS-CoV-2 target nucleic acids are NOT DETECTED. The SARS-CoV-2 RNA is generally detectable in upper respiratoy specimens during the acute phase of infection. The lowest concentration of SARS-CoV-2 viral copies this assay can detect is 131 copies/mL. A negative result does not preclude SARS-Cov-2 infection and should not be used as the sole basis for treatment or other patient management decisions. A negative result may occur with  improper specimen collection/handling, submission of specimen other than nasopharyngeal swab, presence of viral mutation(s) within the areas targeted by this assay, and inadequate number of viral copies (<131 copies/mL). A negative result must be combined with clinical observations, patient history, and epidemiological information. The expected result is Negative. Fact Sheet for Patients:  PinkCheek.be Fact Sheet for Healthcare Providers:  GravelBags.it This test is not yet ap proved or cleared by the [place] FDA and  has been authorized for detection and/or diagnosis of SARS-CoV-2 by FDA under an Emergency Use Authorization (EUA). This EUA will remain  in effect (meaning this test can be used) for the duration of the COVID-19 declaration under Section 564(b)(1) of the Act, 21 U.S.C. section 360bbb-3(b)(1), unless the authorization is terminated or revoked sooner.    Influenza A by PCR NEGATIVE NEGATIVE  Final   Influenza B by PCR NEGATIVE NEGATIVE Final    Comment: (NOTE) The Xpert Xpress SARS-CoV-2/FLU/RSV assay is intended as an aid in  the diagnosis of influenza from Nasopharyngeal swab specimens and  should not be used as a sole basis for treatment. Nasal washings and  aspirates are unacceptable for Xpert Xpress SARS-CoV-2/FLU/RSV  testing. Fact Sheet for Patients: PinkCheek.be Fact Sheet for Healthcare Providers: GravelBags.it This test is not yet approved or cleared by the [place] FDA and  has been authorized for detection and/or diagnosis of SARS-CoV-2 by  FDA under an Emergency Use Authorization (EUA). This EUA will remain  in effect (meaning this test can be used) for the duration of the  Covid-19 declaration under Section 564(b)(1) of the Act, 21  U.S.C. section 360bbb-3(b)(1), unless the authorization is  terminated or revoked. Performed at [facility], [address] [place], [place] [address]   MRSA PCR Screening     Status: None   Collection Time: 05/26/19  9:25 PM   Specimen: Nasal Mucosa; Nasopharyngeal  Result Value Ref Range Status   MRSA by PCR NEGATIVE NEGATIVE Final    Comment:        The GeneXpert MRSA Assay (FDA approved for NASAL specimens only), is one component of a comprehensive MRSA colonization surveillance program. It is not intended to diagnose MRSA infection nor to guide or monitor treatment for MRSA infections. Performed at [facility], [address] [place], [place] [address]      Time coordinating discharge: 25 minutes The [place] controlled substances registry was reviewed for this patient.      SIGNED:   Edwin Dada, MD  [facility] 06/01/2019, 8:39 AM

## 2019-06-03 ENCOUNTER — Telehealth: Payer: Self-pay | Admitting: Family

## 2019-06-03 NOTE — Telephone Encounter (Signed)
Spoke with patient and he said he is doing ok since he left hospital but would not give me much more information. He refused his appt we had scheduled for a follow up with CHF as he already had another appt but rescheduled it for 2/25.   Alyse Low, [place]

## 2019-06-05 ENCOUNTER — Ambulatory Visit: Payer: Medicaid Other | Admitting: Family

## 2019-06-11 ENCOUNTER — Encounter: Payer: Self-pay | Admitting: Family

## 2019-06-11 ENCOUNTER — Ambulatory Visit: Payer: Medicaid Other | Attending: Family | Admitting: Family

## 2019-06-11 ENCOUNTER — Other Ambulatory Visit: Payer: Self-pay

## 2019-06-11 VITALS — BP 107/76 | HR 77 | Resp 20 | Ht 75.0 in | Wt 347.8 lb

## 2019-06-11 DIAGNOSIS — I10 Essential (primary) hypertension: Secondary | ICD-10-CM

## 2019-06-11 DIAGNOSIS — Z7982 Long term (current) use of aspirin: Secondary | ICD-10-CM | POA: Diagnosis not present

## 2019-06-11 DIAGNOSIS — G4733 Obstructive sleep apnea (adult) (pediatric): Secondary | ICD-10-CM | POA: Diagnosis not present

## 2019-06-11 DIAGNOSIS — F17210 Nicotine dependence, cigarettes, uncomplicated: Secondary | ICD-10-CM | POA: Diagnosis not present

## 2019-06-11 DIAGNOSIS — I110 Hypertensive heart disease with heart failure: Secondary | ICD-10-CM | POA: Diagnosis not present

## 2019-06-11 DIAGNOSIS — I5032 Chronic diastolic (congestive) heart failure: Secondary | ICD-10-CM | POA: Diagnosis not present

## 2019-06-11 DIAGNOSIS — Z720 Tobacco use: Secondary | ICD-10-CM

## 2019-06-11 DIAGNOSIS — R0602 Shortness of breath: Secondary | ICD-10-CM | POA: Diagnosis present

## 2019-06-11 DIAGNOSIS — Z79899 Other long term (current) drug therapy: Secondary | ICD-10-CM | POA: Diagnosis not present

## 2019-06-11 LAB — BASIC METABOLIC PANEL
Anion gap: 8 (ref 5–15)
BUN: 26 mg/dL — ABNORMAL HIGH (ref 8–23)
CO2: 30 mmol/L (ref 22–32)
Calcium: 8.4 mg/dL — ABNORMAL LOW (ref 8.9–10.3)
Chloride: 104 mmol/L (ref 98–111)
Creatinine, Ser: 0.99 mg/dL (ref 0.61–1.24)
GFR calc Af Amer: >60 mL/min (ref >60)
GFR calc non Af Amer: >60 mL/min (ref >60)
Glucose, Bld: 78 mg/dL (ref 70–99)
Potassium: 4.2 mmol/L (ref 3.5–5.1)
Sodium: 142 mmol/L (ref 135–145)

## 2019-06-11 NOTE — Patient Instructions (Signed)
Continue weighing daily and call for an overnight weight gain of > 2 pounds or a weekly weight gain of >5 pounds. 
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
## 2019-07-14 ENCOUNTER — Telehealth: Payer: Self-pay | Admitting: *Deleted

## 2019-07-14 ENCOUNTER — Encounter: Payer: Self-pay | Admitting: Pain Medicine

## 2019-07-14 DIAGNOSIS — G894 Chronic pain syndrome: Secondary | ICD-10-CM | POA: Insufficient documentation

## 2019-07-14 DIAGNOSIS — M899 Disorder of bone, unspecified: Secondary | ICD-10-CM | POA: Insufficient documentation

## 2019-07-14 DIAGNOSIS — S42302S Unspecified fracture of shaft of humerus, left arm, sequela: Secondary | ICD-10-CM | POA: Insufficient documentation

## 2019-07-14 DIAGNOSIS — M25552 Pain in left hip: Secondary | ICD-10-CM | POA: Insufficient documentation

## 2019-07-14 DIAGNOSIS — Z789 Other specified health status: Secondary | ICD-10-CM | POA: Insufficient documentation

## 2019-07-14 DIAGNOSIS — M545 Low back pain, unspecified: Secondary | ICD-10-CM | POA: Insufficient documentation

## 2019-07-14 DIAGNOSIS — M5030 Other cervical disc degeneration, unspecified cervical region: Secondary | ICD-10-CM | POA: Insufficient documentation

## 2019-07-14 DIAGNOSIS — G8929 Other chronic pain: Secondary | ICD-10-CM | POA: Insufficient documentation

## 2019-07-14 DIAGNOSIS — Z7901 Long term (current) use of anticoagulants: Secondary | ICD-10-CM | POA: Insufficient documentation

## 2019-07-14 DIAGNOSIS — M25511 Pain in right shoulder: Secondary | ICD-10-CM | POA: Insufficient documentation

## 2019-07-14 DIAGNOSIS — M47816 Spondylosis without myelopathy or radiculopathy, lumbar region: Secondary | ICD-10-CM | POA: Insufficient documentation

## 2019-07-14 DIAGNOSIS — Z79899 Other long term (current) drug therapy: Secondary | ICD-10-CM | POA: Insufficient documentation

## 2019-07-14 DIAGNOSIS — M5137 Other intervertebral disc degeneration, lumbosacral region: Secondary | ICD-10-CM | POA: Insufficient documentation

## 2019-07-14 DIAGNOSIS — M79641 Pain in right hand: Secondary | ICD-10-CM | POA: Insufficient documentation

## 2019-07-14 NOTE — Telephone Encounter (Signed)
Attempted to call for new patient assessment. Unable to leave a message, mailbox is full.

## 2019-07-15 ENCOUNTER — Other Ambulatory Visit: Payer: Self-pay

## 2019-07-15 ENCOUNTER — Telehealth: Payer: Self-pay | Admitting: *Deleted

## 2019-07-15 ENCOUNTER — Ambulatory Visit: Payer: Medicaid Other | Attending: Pain Medicine | Admitting: Pain Medicine

## 2019-07-15 DIAGNOSIS — M79641 Pain in right hand: Secondary | ICD-10-CM

## 2019-07-15 DIAGNOSIS — M79671 Pain in right foot: Secondary | ICD-10-CM

## 2019-07-15 DIAGNOSIS — G894 Chronic pain syndrome: Secondary | ICD-10-CM

## 2019-07-15 DIAGNOSIS — M545 Low back pain, unspecified: Secondary | ICD-10-CM

## 2019-07-15 DIAGNOSIS — M25512 Pain in left shoulder: Secondary | ICD-10-CM

## 2019-07-15 DIAGNOSIS — M25552 Pain in left hip: Secondary | ICD-10-CM

## 2019-07-15 DIAGNOSIS — S42302S Unspecified fracture of shaft of humerus, left arm, sequela: Secondary | ICD-10-CM

## 2019-07-15 DIAGNOSIS — M25511 Pain in right shoulder: Secondary | ICD-10-CM

## 2019-07-15 DIAGNOSIS — G8929 Other chronic pain: Secondary | ICD-10-CM

## 2019-07-15 DIAGNOSIS — M25561 Pain in right knee: Secondary | ICD-10-CM

## 2019-07-15 DIAGNOSIS — M79672 Pain in left foot: Secondary | ICD-10-CM

## 2019-07-15 DIAGNOSIS — Z79899 Other long term (current) drug therapy: Secondary | ICD-10-CM

## 2019-07-15 DIAGNOSIS — M25562 Pain in left knee: Secondary | ICD-10-CM

## 2019-07-15 DIAGNOSIS — M899 Disorder of bone, unspecified: Secondary | ICD-10-CM

## 2019-07-15 DIAGNOSIS — M79642 Pain in left hand: Secondary | ICD-10-CM

## 2019-07-15 DIAGNOSIS — M47816 Spondylosis without myelopathy or radiculopathy, lumbar region: Secondary | ICD-10-CM | POA: Diagnosis not present

## 2019-07-15 DIAGNOSIS — Z789 Other specified health status: Secondary | ICD-10-CM

## 2019-07-15 DIAGNOSIS — M5137 Other intervertebral disc degeneration, lumbosacral region: Secondary | ICD-10-CM | POA: Diagnosis not present

## 2019-07-15 DIAGNOSIS — M25551 Pain in right hip: Secondary | ICD-10-CM

## 2019-07-15 DIAGNOSIS — M5030 Other cervical disc degeneration, unspecified cervical region: Secondary | ICD-10-CM

## 2019-07-15 DIAGNOSIS — S42102S Fracture of unspecified part of scapula, left shoulder, sequela: Secondary | ICD-10-CM

## 2019-07-28 ENCOUNTER — Ambulatory Visit
Admission: RE | Admit: 2019-07-28 | Discharge: 2019-07-28 | Disposition: A | Discharge status: Home / Self Care | Payer: Medicaid Other | Source: Ambulatory Visit | Attending: Pain Medicine | Admitting: Pain Medicine

## 2019-07-28 DIAGNOSIS — M47816 Spondylosis without myelopathy or radiculopathy, lumbar region: Secondary | ICD-10-CM | POA: Diagnosis present

## 2019-07-28 DIAGNOSIS — M25562 Pain in left knee: Secondary | ICD-10-CM | POA: Diagnosis present

## 2019-07-28 DIAGNOSIS — M79642 Pain in left hand: Secondary | ICD-10-CM

## 2019-07-28 DIAGNOSIS — M51379 Other intervertebral disc degeneration, lumbosacral region without mention of lumbar back pain or lower extremity pain: Secondary | ICD-10-CM

## 2019-07-28 DIAGNOSIS — M25561 Pain in right knee: Secondary | ICD-10-CM | POA: Diagnosis present

## 2019-07-28 DIAGNOSIS — M79641 Pain in right hand: Secondary | ICD-10-CM | POA: Diagnosis present

## 2019-07-28 DIAGNOSIS — M5137 Other intervertebral disc degeneration, lumbosacral region: Secondary | ICD-10-CM | POA: Insufficient documentation

## 2019-07-28 DIAGNOSIS — S42302S Unspecified fracture of shaft of humerus, left arm, sequela: Secondary | ICD-10-CM | POA: Diagnosis present

## 2019-07-28 DIAGNOSIS — M545 Low back pain, unspecified: Secondary | ICD-10-CM

## 2019-07-28 DIAGNOSIS — M79672 Pain in left foot: Secondary | ICD-10-CM | POA: Insufficient documentation

## 2019-07-28 DIAGNOSIS — M25551 Pain in right hip: Secondary | ICD-10-CM | POA: Insufficient documentation

## 2019-07-28 DIAGNOSIS — G8929 Other chronic pain: Secondary | ICD-10-CM | POA: Insufficient documentation

## 2019-07-28 DIAGNOSIS — M25512 Pain in left shoulder: Secondary | ICD-10-CM | POA: Diagnosis present

## 2019-07-28 DIAGNOSIS — S42102S Fracture of unspecified part of scapula, left shoulder, sequela: Secondary | ICD-10-CM | POA: Diagnosis present

## 2019-07-28 DIAGNOSIS — M25552 Pain in left hip: Secondary | ICD-10-CM | POA: Insufficient documentation

## 2019-07-28 DIAGNOSIS — M25511 Pain in right shoulder: Secondary | ICD-10-CM | POA: Diagnosis present

## 2019-07-28 DIAGNOSIS — M79671 Pain in right foot: Secondary | ICD-10-CM | POA: Diagnosis present

## 2019-07-31 LAB — COMPLIANCE DRUG ANALYSIS, UR

## 2019-08-03 LAB — VITAMIN B12: Vitamin B-12: 614 pg/mL (ref 232–1245)

## 2019-08-03 LAB — 25-HYDROXY VITAMIN D LCMS D2+D3
25-Hydroxy, Vitamin D-2: <1 ng/mL
25-Hydroxy, Vitamin D-3: 8.1 ng/mL
25-Hydroxy, Vitamin D: 8.3 ng/mL — ABNORMAL LOW

## 2019-08-03 LAB — C-REACTIVE PROTEIN: CRP: 7 mg/L (ref 0–10)

## 2019-08-03 LAB — SEDIMENTATION RATE: Sed Rate: 78 mm/hr — ABNORMAL HIGH (ref 0–30)

## 2019-08-04 ENCOUNTER — Inpatient Hospital Stay
Admission: EM | Admit: 2019-08-04 | Discharge: 2019-08-10 | DRG: 291 | Disposition: A | Discharge status: Home / Self Care | Payer: Medicaid Other | Attending: Internal Medicine | Admitting: Internal Medicine

## 2019-08-04 ENCOUNTER — Other Ambulatory Visit: Payer: Self-pay

## 2019-08-04 ENCOUNTER — Encounter: Payer: Self-pay | Admitting: Emergency Medicine

## 2019-08-04 ENCOUNTER — Emergency Department: Payer: Medicaid Other

## 2019-08-04 DIAGNOSIS — J9622 Acute and chronic respiratory failure with hypercapnia: Secondary | ICD-10-CM | POA: Diagnosis present

## 2019-08-04 DIAGNOSIS — D696 Thrombocytopenia, unspecified: Secondary | ICD-10-CM | POA: Diagnosis present

## 2019-08-04 DIAGNOSIS — R0602 Shortness of breath: Secondary | ICD-10-CM

## 2019-08-04 DIAGNOSIS — Z823 Family history of stroke: Secondary | ICD-10-CM | POA: Diagnosis not present

## 2019-08-04 DIAGNOSIS — Z9049 Acquired absence of other specified parts of digestive tract: Secondary | ICD-10-CM

## 2019-08-04 DIAGNOSIS — Z79899 Other long term (current) drug therapy: Secondary | ICD-10-CM | POA: Diagnosis not present

## 2019-08-04 DIAGNOSIS — I5023 Acute on chronic systolic (congestive) heart failure: Secondary | ICD-10-CM | POA: Diagnosis present

## 2019-08-04 DIAGNOSIS — E785 Hyperlipidemia, unspecified: Secondary | ICD-10-CM | POA: Diagnosis present

## 2019-08-04 DIAGNOSIS — Z7982 Long term (current) use of aspirin: Secondary | ICD-10-CM | POA: Diagnosis not present

## 2019-08-04 DIAGNOSIS — Z9111 Patient's noncompliance with dietary regimen: Secondary | ICD-10-CM | POA: Diagnosis not present

## 2019-08-04 DIAGNOSIS — J9600 Acute respiratory failure, unspecified whether with hypoxia or hypercapnia: Secondary | ICD-10-CM | POA: Diagnosis present

## 2019-08-04 DIAGNOSIS — Z833 Family history of diabetes mellitus: Secondary | ICD-10-CM

## 2019-08-04 DIAGNOSIS — I5033 Acute on chronic diastolic (congestive) heart failure: Secondary | ICD-10-CM | POA: Diagnosis not present

## 2019-08-04 DIAGNOSIS — G4733 Obstructive sleep apnea (adult) (pediatric): Secondary | ICD-10-CM | POA: Diagnosis not present

## 2019-08-04 DIAGNOSIS — F1910 Other psychoactive substance abuse, uncomplicated: Secondary | ICD-10-CM | POA: Diagnosis not present

## 2019-08-04 DIAGNOSIS — Z8711 Personal history of peptic ulcer disease: Secondary | ICD-10-CM | POA: Diagnosis not present

## 2019-08-04 DIAGNOSIS — D751 Secondary polycythemia: Secondary | ICD-10-CM | POA: Diagnosis present

## 2019-08-04 DIAGNOSIS — I5043 Acute on chronic combined systolic (congestive) and diastolic (congestive) heart failure: Secondary | ICD-10-CM | POA: Diagnosis present

## 2019-08-04 DIAGNOSIS — Z6841 Body Mass Index (BMI) 40.0 and over, adult: Secondary | ICD-10-CM

## 2019-08-04 DIAGNOSIS — J9602 Acute respiratory failure with hypercapnia: Secondary | ICD-10-CM | POA: Diagnosis not present

## 2019-08-04 DIAGNOSIS — E872 Acidosis: Secondary | ICD-10-CM | POA: Diagnosis present

## 2019-08-04 DIAGNOSIS — I5032 Chronic diastolic (congestive) heart failure: Secondary | ICD-10-CM | POA: Diagnosis not present

## 2019-08-04 DIAGNOSIS — I509 Heart failure, unspecified: Secondary | ICD-10-CM

## 2019-08-04 DIAGNOSIS — E662 Morbid (severe) obesity with alveolar hypoventilation: Secondary | ICD-10-CM | POA: Diagnosis present

## 2019-08-04 DIAGNOSIS — F17210 Nicotine dependence, cigarettes, uncomplicated: Secondary | ICD-10-CM | POA: Diagnosis present

## 2019-08-04 DIAGNOSIS — I110 Hypertensive heart disease with heart failure: Secondary | ICD-10-CM | POA: Diagnosis present

## 2019-08-04 DIAGNOSIS — F17200 Nicotine dependence, unspecified, uncomplicated: Secondary | ICD-10-CM | POA: Diagnosis not present

## 2019-08-04 DIAGNOSIS — J9621 Acute and chronic respiratory failure with hypoxia: Secondary | ICD-10-CM | POA: Diagnosis present

## 2019-08-04 DIAGNOSIS — J9601 Acute respiratory failure with hypoxia: Secondary | ICD-10-CM | POA: Diagnosis not present

## 2019-08-04 DIAGNOSIS — Z20822 Contact with and (suspected) exposure to covid-19: Secondary | ICD-10-CM | POA: Diagnosis present

## 2019-08-04 DIAGNOSIS — I440 Atrioventricular block, first degree: Secondary | ICD-10-CM | POA: Diagnosis present

## 2019-08-04 DIAGNOSIS — E7849 Other hyperlipidemia: Secondary | ICD-10-CM | POA: Diagnosis not present

## 2019-08-04 LAB — CBC
HCT: 53.5 % — ABNORMAL HIGH (ref 39.0–52.0)
Hemoglobin: 14.1 g/dL (ref 13.0–17.0)
MCH: 21.5 pg — ABNORMAL LOW (ref 26.0–34.0)
MCHC: 26.4 g/dL — ABNORMAL LOW (ref 30.0–36.0)
MCV: 81.4 fL (ref 80.0–100.0)
Platelets: 144 10*3/uL — ABNORMAL LOW (ref 150–400)
RBC: 6.57 MIL/uL — ABNORMAL HIGH (ref 4.22–5.81)
RDW: 22.2 % — ABNORMAL HIGH (ref 11.5–15.5)
WBC: 5.5 10*3/uL (ref 4.0–10.5)
nRBC: 2.3 % — ABNORMAL HIGH (ref 0.0–0.2)

## 2019-08-04 LAB — BASIC METABOLIC PANEL
Anion gap: 8 (ref 5–15)
BUN: 22 mg/dL (ref 8–23)
CO2: 33 mmol/L — ABNORMAL HIGH (ref 22–32)
Calcium: 8.1 mg/dL — ABNORMAL LOW (ref 8.9–10.3)
Chloride: 100 mmol/L (ref 98–111)
Creatinine, Ser: 1.13 mg/dL (ref 0.61–1.24)
GFR calc Af Amer: >60 mL/min (ref >60)
GFR calc non Af Amer: >60 mL/min (ref >60)
Glucose, Bld: 107 mg/dL — ABNORMAL HIGH (ref 70–99)
Potassium: 3.9 mmol/L (ref 3.5–5.1)
Sodium: 141 mmol/L (ref 135–145)

## 2019-08-04 LAB — BLOOD GAS, ARTERIAL
Acid-Base Excess: 6.2 mmol/L — ABNORMAL HIGH (ref 0.0–2.0)
Bicarbonate: 38.4 mmol/L — ABNORMAL HIGH (ref 20.0–28.0)
FIO2: 0.36
O2 Saturation: 92.9 %
Patient temperature: 37
pCO2 arterial: 103 mmHg (ref 32.0–48.0)
pH, Arterial: 7.18 — CL (ref 7.350–7.450)
pO2, Arterial: 82 mmHg — ABNORMAL LOW (ref 83.0–108.0)

## 2019-08-04 LAB — TROPONIN I (HIGH SENSITIVITY)
Troponin I (High Sensitivity): 7 ng/L (ref <18)
Troponin I (High Sensitivity): 8 ng/L (ref <18)

## 2019-08-04 LAB — RESPIRATORY PANEL BY RT PCR (FLU A&B, COVID)
Influenza A by PCR: NEGATIVE
Influenza B by PCR: NEGATIVE
SARS Coronavirus 2 by RT PCR: NEGATIVE

## 2019-08-04 LAB — BRAIN NATRIURETIC PEPTIDE: B Natriuretic Peptide: 990 pg/mL — ABNORMAL HIGH (ref 0.0–100.0)

## 2019-08-04 MED ORDER — SPIRONOLACTONE 25 MG PO TABS
25.0000 mg | ORAL_TABLET | Freq: Every day | ORAL | Status: DC
  Administered 2019-08-06 – 2019-08-10 (×5): 25 mg via ORAL
  Filled 2019-08-04 (×5): qty 1

## 2019-08-04 MED ORDER — SPIRONOLACTONE 25 MG PO TABS: 25.0000 mg | ORAL_TABLET | Freq: Every day | ORAL | Status: DC

## 2019-08-04 MED ORDER — SIMVASTATIN 20 MG PO TABS
10.0000 mg | ORAL_TABLET | Freq: Every day | ORAL | Status: DC
  Administered 2019-08-05 – 2019-08-09 (×5): 10 mg via ORAL
  Filled 2019-08-04 (×5): qty 1

## 2019-08-04 MED ORDER — ZOLPIDEM TARTRATE 5 MG PO TABS
5.0000 mg | ORAL_TABLET | Freq: Every evening | ORAL | Status: DC | PRN
  Administered 2019-08-06: 5 mg via ORAL
  Filled 2019-08-04: qty 1

## 2019-08-04 MED ORDER — ONDANSETRON HCL 4 MG/2ML IJ SOLN: 4.0000 mg | Freq: Four times a day (QID) | INTRAMUSCULAR | Status: DC | PRN

## 2019-08-04 MED ORDER — SODIUM CHLORIDE 0.9% FLUSH: 3.0000 mL | INTRAVENOUS | Status: DC | PRN

## 2019-08-04 MED ORDER — ENOXAPARIN SODIUM 40 MG/0.4ML ~~LOC~~ SOLN
40.0000 mg | Freq: Two times a day (BID) | SUBCUTANEOUS | Status: DC
  Administered 2019-08-05 – 2019-08-10 (×12): 40 mg via SUBCUTANEOUS
  Filled 2019-08-04 (×12): qty 0.4

## 2019-08-04 MED ORDER — ACETAMINOPHEN 325 MG PO TABS
650.0000 mg | ORAL_TABLET | ORAL | Status: DC | PRN
  Administered 2019-08-07: 650 mg via ORAL
  Filled 2019-08-04: qty 2

## 2019-08-04 MED ORDER — SODIUM CHLORIDE 0.9% FLUSH
3.0000 mL | Freq: Two times a day (BID) | INTRAVENOUS | Status: DC
  Administered 2019-08-05 – 2019-08-10 (×12): 3 mL via INTRAVENOUS

## 2019-08-04 MED ORDER — SODIUM CHLORIDE 0.9 % IV SOLN: 250.0000 mL | INTRAVENOUS | Status: DC | PRN

## 2019-08-04 MED ORDER — ASPIRIN EC 81 MG PO TBEC
81.0000 mg | DELAYED_RELEASE_TABLET | Freq: Every day | ORAL | Status: DC
  Administered 2019-08-06 – 2019-08-10 (×5): 81 mg via ORAL
  Filled 2019-08-04 (×5): qty 1

## 2019-08-04 MED ORDER — FUROSEMIDE 10 MG/ML IJ SOLN
80.0000 mg | Freq: Once | INTRAMUSCULAR | Status: AC
  Administered 2019-08-04: 80 mg via INTRAVENOUS
  Filled 2019-08-04: qty 8

## 2019-08-04 MED ORDER — FUROSEMIDE 10 MG/ML IJ SOLN
80.0000 mg | Freq: Two times a day (BID) | INTRAMUSCULAR | Status: DC
  Administered 2019-08-05 – 2019-08-10 (×11): 80 mg via INTRAVENOUS
  Filled 2019-08-04 (×11): qty 8

## 2019-08-04 MED ORDER — ALPRAZOLAM 0.25 MG PO TABS: 0.2500 mg | ORAL_TABLET | Freq: Two times a day (BID) | ORAL | Status: DC | PRN
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
## 2019-08-04 NOTE — ED Notes (Signed)
This RN at bedside. Pt oxygen sats dropped to 85% on 28% FiO2. Pt currently sleeping. Pt awakens to sternal rub and oxygen sats improved to 93%. Pt fell back asleep and oxygen sats dropped to 85%. This RN on phone with respiratory and gave verbal order to increase FiO2 to 35%. Pt oxygen increased to 93% at this time.

## 2019-08-04 NOTE — Progress Notes (Signed)
Arterial Blood Gas drawn per Dr. Sidney Ace.  Pt. Placed on BIPAP 15/5 RR 8  FiO2 28%.  Pt. Transported on BIPAP to ED room 24.  Pt. Tolerating BIPAP well with improved alertness noted. No distress noted. Dr. Sidney Ace  Alerted to ABG results in Epic. Will continue to monitor.

## 2019-08-04 NOTE — ED Notes (Signed)
UA sent to lab
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
## 2019-08-04 NOTE — ED Triage Notes (Signed)
Patient presents to the ED with increased swelling to his legs and abdomen x 3 days.  Patient reports history of CHF.  Patient states he has doubled up on his lasix the past couple of days and the swelling has decreased but not to the point where he feels comfortable.  Patient states, "I think I need the medicine through the IV."  Patient also states, "I've been seeing flashing stars."  Patient states that sometimes occurs when his fluid builds up and he gets really out of breath.

## 2019-08-04 NOTE — ED Notes (Signed)
This RN at bedside due to pt's BiPAP machine alarming and oxygen saturations dropping. Pt with mask off and on forehead. Pt directed to keep BiPAP mask on due to CO2 being so high and oxygen saturations dropping. Pt verbalized understanding. Will continue to monitor.

## 2019-08-04 NOTE — ED Notes (Signed)
[place] was sent to lab. 
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
## 2019-08-05 ENCOUNTER — Inpatient Hospital Stay: Payer: Medicaid Other

## 2019-08-05 ENCOUNTER — Encounter: Payer: Self-pay | Admitting: Family Medicine

## 2019-08-05 DIAGNOSIS — J9601 Acute respiratory failure with hypoxia: Secondary | ICD-10-CM

## 2019-08-05 DIAGNOSIS — I5023 Acute on chronic systolic (congestive) heart failure: Secondary | ICD-10-CM

## 2019-08-05 LAB — CBC WITH DIFFERENTIAL/PLATELET
Abs Immature Granulocytes: 0.03 10*3/uL (ref 0.00–0.07)
Basophils Absolute: 0 10*3/uL (ref 0.0–0.1)
Basophils Relative: 1 %
Eosinophils Absolute: 0.1 10*3/uL (ref 0.0–0.5)
Eosinophils Relative: 1 %
HCT: 56.8 % — ABNORMAL HIGH (ref 39.0–52.0)
Hemoglobin: 14.2 g/dL (ref 13.0–17.0)
Immature Granulocytes: 1 %
Lymphocytes Relative: 27 %
Lymphs Abs: 1.4 10*3/uL (ref 0.7–4.0)
MCH: 21.1 pg — ABNORMAL LOW (ref 26.0–34.0)
MCHC: 25 g/dL — ABNORMAL LOW (ref 30.0–36.0)
MCV: 84.3 fL (ref 80.0–100.0)
Monocytes Absolute: 0.9 10*3/uL (ref 0.1–1.0)
Monocytes Relative: 16 %
Neutro Abs: 2.9 10*3/uL (ref 1.7–7.7)
Neutrophils Relative %: 54 %
Platelets: 103 10*3/uL — ABNORMAL LOW (ref 150–400)
RBC: 6.74 MIL/uL — ABNORMAL HIGH (ref 4.22–5.81)
RDW: 22.5 % — ABNORMAL HIGH (ref 11.5–15.5)
WBC: 5.4 10*3/uL (ref 4.0–10.5)
nRBC: 3 % — ABNORMAL HIGH (ref 0.0–0.2)

## 2019-08-05 LAB — BLOOD GAS, ARTERIAL
Acid-Base Excess: 11.4 mmol/L — ABNORMAL HIGH (ref 0.0–2.0)
Acid-Base Excess: 8.1 mmol/L — ABNORMAL HIGH (ref 0.0–2.0)
Acid-Base Excess: 9.4 mmol/L — ABNORMAL HIGH (ref 0.0–2.0)
Bicarbonate: 39.4 mmol/L — ABNORMAL HIGH (ref 20.0–28.0)
Bicarbonate: 40.5 mmol/L — ABNORMAL HIGH (ref 20.0–28.0)
Bicarbonate: 40.7 mmol/L — ABNORMAL HIGH (ref 20.0–28.0)
Delivery systems: POSITIVE
Delivery systems: POSITIVE
Expiratory PAP: 10
Expiratory PAP: 5
FIO2: 0.35
FIO2: 0.45
FIO2: 0.48
Inspiratory PAP: 15
Inspiratory PAP: 22
O2 Saturation: 93 %
O2 Saturation: 93.3 %
O2 Saturation: 95.6 %
Patient temperature: 37
Patient temperature: 37
Patient temperature: 37
pCO2 arterial: 75 mmHg (ref 32.0–48.0)
pCO2 arterial: 94 mmHg (ref 32.0–48.0)
pCO2 arterial: 95 mmHg (ref 32.0–48.0)
pH, Arterial: 7.23 — ABNORMAL LOW (ref 7.350–7.450)
pH, Arterial: 7.24 — ABNORMAL LOW (ref 7.350–7.450)
pH, Arterial: 7.34 — ABNORMAL LOW (ref 7.350–7.450)
pO2, Arterial: 71 mmHg — ABNORMAL LOW (ref 83.0–108.0)
pO2, Arterial: 80 mmHg — ABNORMAL LOW (ref 83.0–108.0)
pO2, Arterial: 92 mmHg (ref 83.0–108.0)

## 2019-08-05 LAB — GLUCOSE, CAPILLARY: Glucose-Capillary: 94 mg/dL (ref 70–99)

## 2019-08-05 LAB — HIV ANTIBODY (ROUTINE TESTING W REFLEX): HIV Screen 4th Generation wRfx: NONREACTIVE

## 2019-08-05 LAB — BASIC METABOLIC PANEL
Anion gap: 10 (ref 5–15)
BUN: 20 mg/dL (ref 8–23)
CO2: 35 mmol/L — ABNORMAL HIGH (ref 22–32)
Calcium: 8 mg/dL — ABNORMAL LOW (ref 8.9–10.3)
Chloride: 99 mmol/L (ref 98–111)
Creatinine, Ser: 1.17 mg/dL (ref 0.61–1.24)
GFR calc Af Amer: >60 mL/min (ref >60)
GFR calc non Af Amer: >60 mL/min (ref >60)
Glucose, Bld: 80 mg/dL (ref 70–99)
Potassium: 4.3 mmol/L (ref 3.5–5.1)
Sodium: 144 mmol/L (ref 135–145)

## 2019-08-05 LAB — MRSA PCR SCREENING: MRSA by PCR: NEGATIVE

## 2019-08-05 MED ORDER — ENSURE MAX PROTEIN PO LIQD
11.0000 [oz_av] | Freq: Two times a day (BID) | ORAL | Status: DC
  Administered 2019-08-05: 237 mL via ORAL
  Administered 2019-08-06 – 2019-08-10 (×8): 11 [oz_av] via ORAL
  Filled 2019-08-05: qty 330

## 2019-08-05 MED ORDER — SODIUM CHLORIDE 0.9% FLUSH
10.0000 mL | Freq: Two times a day (BID) | INTRAVENOUS | Status: DC
  Administered 2019-08-05 (×2): 10 mL
  Administered 2019-08-05: 40 mL
  Administered 2019-08-06 (×2): 10 mL

## 2019-08-05 MED ORDER — SODIUM CHLORIDE 0.9% FLUSH: 10.0000 mL | INTRAVENOUS | Status: DC | PRN

## 2019-08-05 MED ORDER — CHLORHEXIDINE GLUCONATE CLOTH 2 % EX PADS
6.0000 | MEDICATED_PAD | Freq: Every day | CUTANEOUS | Status: DC
  Administered 2019-08-05 – 2019-08-06 (×2): 6 via TOPICAL

## 2019-08-05 NOTE — Progress Notes (Addendum)
CSW consulted for Heart Failure Screen and PT/OT evaluation follow-up.   CSW attempted to meet with patient in room x 2 for Heart Failure Screening, patient sleeping. Attempted call to significant other x 2, line busy. Attempted call to sister, no answer.   Please submit PT/OT consults when appropriate and CSW will follow-up based on their recommendations.   1:50- Received a return call from patient's sister who reported patient's girlfriend would be able to answer questions for screening, she was unsure of her current phone number.  Oleh Genin, [place]

## 2019-08-05 NOTE — Progress Notes (Signed)
Nutrition Brief Note  Received a call from SLP. Pt requesting additional food, reports he is not getting enough to eat with his meals. RD will add double protein with lunch and dinner, low sodium snacks between meals and Ensure Max protein supplement BID. RD will also change pt over from a renal diet to a low sodium diet as pt's electrolytes are wnl and a renal diet is highly restrictive.   Koleen Distance MS, RD, LDN Please refer to [facility] for RD and/or RD on-call/weekend/after hours pager
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
## 2019-08-05 NOTE — Progress Notes (Deleted)
OMIT this note

## 2019-08-06 LAB — CBC WITH DIFFERENTIAL/PLATELET
Abs Immature Granulocytes: 0.02 10*3/uL (ref 0.00–0.07)
Basophils Absolute: 0 10*3/uL (ref 0.0–0.1)
Basophils Relative: 0 %
Eosinophils Absolute: 0.1 10*3/uL (ref 0.0–0.5)
Eosinophils Relative: 2 %
HCT: 55.3 % — ABNORMAL HIGH (ref 39.0–52.0)
Hemoglobin: 14.3 g/dL (ref 13.0–17.0)
Immature Granulocytes: 0 %
Lymphocytes Relative: 22 %
Lymphs Abs: 1.1 10*3/uL (ref 0.7–4.0)
MCH: 21.3 pg — ABNORMAL LOW (ref 26.0–34.0)
MCHC: 25.9 g/dL — ABNORMAL LOW (ref 30.0–36.0)
MCV: 82.4 fL (ref 80.0–100.0)
Monocytes Absolute: 0.7 10*3/uL (ref 0.1–1.0)
Monocytes Relative: 14 %
Neutro Abs: 3.1 10*3/uL (ref 1.7–7.7)
Neutrophils Relative %: 62 %
Platelets: 90 10*3/uL — ABNORMAL LOW (ref 150–400)
RBC: 6.71 MIL/uL — ABNORMAL HIGH (ref 4.22–5.81)
RDW: 22.6 % — ABNORMAL HIGH (ref 11.5–15.5)
WBC: 5.1 10*3/uL (ref 4.0–10.5)
nRBC: 1.2 % — ABNORMAL HIGH (ref 0.0–0.2)

## 2019-08-06 LAB — BASIC METABOLIC PANEL
Anion gap: 8 (ref 5–15)
BUN: 24 mg/dL — ABNORMAL HIGH (ref 8–23)
CO2: 37 mmol/L — ABNORMAL HIGH (ref 22–32)
Calcium: 8.1 mg/dL — ABNORMAL LOW (ref 8.9–10.3)
Chloride: 97 mmol/L — ABNORMAL LOW (ref 98–111)
Creatinine, Ser: 1.21 mg/dL (ref 0.61–1.24)
GFR calc Af Amer: >60 mL/min (ref >60)
GFR calc non Af Amer: >60 mL/min (ref >60)
Glucose, Bld: 109 mg/dL — ABNORMAL HIGH (ref 70–99)
Potassium: 4.3 mmol/L (ref 3.5–5.1)
Sodium: 142 mmol/L (ref 135–145)
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
## 2019-08-06 NOTE — Progress Notes (Signed)
   08/06/19 1800  Clinical Encounter Type  Visited With Patient  Visit Type Initial  Referral From Chaplain  Consult/Referral To Chaplain  Spiritual Encounters  Spiritual Needs Prayer  Stress Factors  Patient Stress Factors Health changes   Ch visitied with Pt. Pt was laying in bed and talking. Pt told Ch about health issues. PT asked [place] for prayer. Ch offered prayer.

## 2019-08-06 NOTE — Progress Notes (Signed)
CSW attempted to meet with patient in room to complete Heart Failure Screening. Patient would not respond, went back to sleep after CSW introduced self. CSW attempted call to patient's girlfriend. Number was busy.   Oleh Genin, [place]

## 2019-08-07 DIAGNOSIS — E7849 Other hyperlipidemia: Secondary | ICD-10-CM

## 2019-08-07 DIAGNOSIS — J9602 Acute respiratory failure with hypercapnia: Secondary | ICD-10-CM

## 2019-08-07 DIAGNOSIS — F17200 Nicotine dependence, unspecified, uncomplicated: Secondary | ICD-10-CM

## 2019-08-07 DIAGNOSIS — I5033 Acute on chronic diastolic (congestive) heart failure: Secondary | ICD-10-CM

## 2019-08-07 DIAGNOSIS — F1910 Other psychoactive substance abuse, uncomplicated: Secondary | ICD-10-CM

## 2019-08-07 LAB — BASIC METABOLIC PANEL
Anion gap: 9 (ref 5–15)
BUN: 28 mg/dL — ABNORMAL HIGH (ref 8–23)
CO2: 38 mmol/L — ABNORMAL HIGH (ref 22–32)
Calcium: 8.6 mg/dL — ABNORMAL LOW (ref 8.9–10.3)
Chloride: 93 mmol/L — ABNORMAL LOW (ref 98–111)
Creatinine, Ser: 1.07 mg/dL (ref 0.61–1.24)
GFR calc Af Amer: >60 mL/min (ref >60)
GFR calc non Af Amer: >60 mL/min (ref >60)
Glucose, Bld: 86 mg/dL (ref 70–99)
Potassium: 4 mmol/L (ref 3.5–5.1)
Sodium: 140 mmol/L (ref 135–145)

## 2019-08-07 LAB — CBC WITH DIFFERENTIAL/PLATELET
Abs Immature Granulocytes: 0.01 10*3/uL (ref 0.00–0.07)
Basophils Absolute: 0 10*3/uL (ref 0.0–0.1)
Basophils Relative: 0 %
Eosinophils Absolute: 0.1 10*3/uL (ref 0.0–0.5)
Eosinophils Relative: 2 %
HCT: 54.9 % — ABNORMAL HIGH (ref 39.0–52.0)
Hemoglobin: 14.7 g/dL (ref 13.0–17.0)
Immature Granulocytes: 0 %
Lymphocytes Relative: 20 %
Lymphs Abs: 1.3 10*3/uL (ref 0.7–4.0)
MCH: 21.3 pg — ABNORMAL LOW (ref 26.0–34.0)
MCHC: 26.8 g/dL — ABNORMAL LOW (ref 30.0–36.0)
MCV: 79.5 fL — ABNORMAL LOW (ref 80.0–100.0)
Monocytes Absolute: 0.8 10*3/uL (ref 0.1–1.0)
Monocytes Relative: 12 %
Neutro Abs: 4.2 10*3/uL (ref 1.7–7.7)
Neutrophils Relative %: 66 %
Platelets: 100 10*3/uL — ABNORMAL LOW (ref 150–400)
RBC: 6.91 MIL/uL — ABNORMAL HIGH (ref 4.22–5.81)
RDW: 22.4 % — ABNORMAL HIGH (ref 11.5–15.5)
Smear Review: NORMAL
WBC: 6.4 10*3/uL (ref 4.0–10.5)
nRBC: 0.3 % — ABNORMAL HIGH (ref 0.0–0.2)

## 2019-08-07 MED ORDER — TRAMADOL HCL 50 MG PO TABS
50.0000 mg | ORAL_TABLET | Freq: Four times a day (QID) | ORAL | Status: DC | PRN
  Administered 2019-08-07 – 2019-08-10 (×7): 50 mg via ORAL
  Filled 2019-08-07 (×8): qty 1

## 2019-08-07 MED ORDER — BISACODYL 5 MG PO TBEC: 5.0000 mg | DELAYED_RELEASE_TABLET | Freq: Every day | ORAL | Status: DC | PRN

## 2019-08-07 MED ORDER — DOCUSATE SODIUM 100 MG PO CAPS
200.0000 mg | ORAL_CAPSULE | Freq: Two times a day (BID) | ORAL | Status: DC
  Administered 2019-08-07 – 2019-08-10 (×7): 200 mg via ORAL
  Filled 2019-08-07 (×8): qty 2

## 2019-08-07 NOTE — Progress Notes (Signed)
Patient refused to wear his Bipap stating that the Bipap is applying too much pressure to the bridge of his nose. He acknowledged that he know how important it is for him to wear, but he just cannot do it.

## 2019-08-07 NOTE — Progress Notes (Signed)
PROGRESS NOTE    Gary [name]  ZOX:[id number] DOB: [birth year] DOA: 08/04/2019 PCP: [facility] [place]:   Active Problems:   Acute on chronic systolic CHF (congestive heart failure) ([name])   Acute respiratory failure ([name])  Acute on chronic diastolic CHF: echo on 11/24/9145 that showed an EF of 55 to 60% with LVH. Continue on IV lasix. Monitor I/Os. Cardio following and recs apprec. Continue on tele   Acute on chronic hypoxic and hypercarbic respiratory failure: secondary to with CHF exacerbation associated CO2 narcosis. Weaned off of BiPAP, continue on supplemental oxygen. Continue on aldactone.  Dyslipidemia: continue statin  Obstructive sleep apnea:  likely with obesity hypoventilation syndrome.  Continue on BiPAP and wean as tolerated  Morbid obesity: BMI 46.7. Would benefit greatly from weight loss   Thrombocytopenia: etiology unclear. Will continue to monitor    DVT prophylaxis: lovenox  Code Status: full  Family Communication: Disposition Plan: likely d/c back home but depends on PT/OT recs   Consultants:   ICU  Procedures:    Antimicrobials:    Subjective: Pt c/o shortness of breath   Objective: Vitals:   08/07/19 1100 08/07/19 1111 08/07/19 1200 08/07/19 1400  BP: 124/78  112/74 116/77  Pulse:  80  79  Resp: 20 (!) 25 16 20   Temp: 98 F (36.7 C)   98.8 F (37.1 C)  TempSrc: Axillary   Oral  SpO2:  93%  91%  Weight:      Height:        Intake/Output Summary (Last 24 hours) at 08/07/2019 1432 Last data filed at 08/07/2019 1300 Gross per 24 hour  Intake 720 ml  Output 4975 ml  Net -4255 ml   Filed Weights   08/05/19 0135 08/06/19 0500 08/07/19 0500  Weight: (!) 169.8 kg (!) 145 kg (!) 156.4 kg    Examination:  General exam: Appears calm and comfortable  Respiratory system: diminished breath sounds b/l.  Cardiovascular system: S1 & S2 +. No rubs, gallops or clicks.  Gastrointestinal system: Abdomen  is obese, soft and nontender.Normal bowel sounds heard. Central nervous system: Alert and oriented. Moves all 4 extremities  Psychiatry: Judgement and insight appear normal. Mood & affect appropriate.     Data Reviewed: I have personally reviewed following labs and imaging studies  CBC: Recent Labs  Lab 08/04/19 1412 08/05/19 0403 08/06/19 0459 08/07/19 0632  WBC 5.5 5.4 5.1 6.4  NEUTROABS  --  2.9 3.1 4.2  HGB 14.1 14.2 14.3 14.7  HCT 53.5* 56.8* 55.3* 54.9*  MCV 81.4 84.3 82.4 79.5*  PLT 144* 103* 90* 829*   Basic Metabolic Panel: Recent Labs  Lab 08/04/19 1412 08/05/19 0403 08/06/19 0459 08/07/19 0632  NA 141 144 142 140  K 3.9 4.3 4.3 4.0  CL 100 99 97* 93*  CO2 33* 35* 37* 38*  GLUCOSE 107* 80 109* 86  BUN 22 20 24* 28*  CREATININE 1.13 1.17 1.21 1.07  CALCIUM 8.1* 8.0* 8.1* 8.6*   GFR: Estimated Creatinine Clearance: 114.7 mL/min (by C-G formula based on SCr of 1.07 mg/dL). Liver Function Tests: No results for input(s): AST, ALT, ALKPHOS, BILITOT, PROT, ALBUMIN in the last 168 hours. No results for input(s): LIPASE, AMYLASE in the last 168 hours. No results for input(s): AMMONIA in the last 168 hours. Coagulation Profile: No results for input(s): INR, PROTIME in the last 168 hours. Cardiac Enzymes: No results for input(s): CKTOTAL, CKMB, CKMBINDEX, TROPONINI in the last  168 hours. BNP (last 3 results) No results for input(s): PROBNP in the last 8760 hours. HbA1C: No results for input(s): HGBA1C in the last 72 hours. CBG: Recent Labs  Lab 08/05/19 0132  GLUCAP 94   Lipid Profile: No results for input(s): CHOL, HDL, LDLCALC, TRIG, CHOLHDL, LDLDIRECT in the last 72 hours. Thyroid Function Tests: No results for input(s): TSH, T4TOTAL, FREET4, T3FREE, THYROIDAB in the last 72 hours. Anemia Panel: No results for input(s): VITAMINB12, FOLATE, FERRITIN, TIBC, IRON, RETICCTPCT in the last 72 hours. Sepsis Labs: No results for input(s): PROCALCITON,  LATICACIDVEN in the last 168 hours.  Recent Results (from the past 240 hour(s))  Respiratory Panel by RT PCR (Flu A&B, Covid) - Nasopharyngeal Swab     Status: None   Collection Time: 08/04/19  8:13 PM   Specimen: Nasopharyngeal Swab  Result Value Ref Range Status   SARS Coronavirus 2 by RT PCR NEGATIVE NEGATIVE Final    Comment: (NOTE) SARS-CoV-2 target nucleic acids are NOT DETECTED. The SARS-CoV-2 RNA is generally detectable in upper respiratoy specimens during the acute phase of infection. The lowest concentration of SARS-CoV-2 viral copies this assay can detect is 131 copies/mL. A negative result does not preclude SARS-Cov-2 infection and should not be used as the sole basis for treatment or other patient management decisions. A negative result may occur with  improper specimen collection/handling, submission of specimen other than nasopharyngeal swab, presence of viral mutation(s) within the areas targeted by this assay, and inadequate number of viral copies (<131 copies/mL). A negative result must be combined with clinical observations, patient history, and epidemiological information. The expected result is Negative. Fact Sheet for Patients:  PinkCheek.be Fact Sheet for Healthcare Providers:  GravelBags.it This test is not yet ap proved or cleared by the [place] FDA and  has been authorized for detection and/or diagnosis of SARS-CoV-2 by FDA under an Emergency Use Authorization (EUA). This EUA will remain  in effect (meaning this test can be used) for the duration of the COVID-19 declaration under Section 564(b)(1) of the Act, 21 U.S.C. section 360bbb-3(b)(1), unless the authorization is terminated or revoked sooner.    Influenza A by PCR NEGATIVE NEGATIVE Final   Influenza B by PCR NEGATIVE NEGATIVE Final    Comment: (NOTE) The Xpert Xpress SARS-CoV-2/FLU/RSV assay is intended as an aid in  the diagnosis of  influenza from Nasopharyngeal swab specimens and  should not be used as a sole basis for treatment. Nasal washings and  aspirates are unacceptable for Xpert Xpress SARS-CoV-2/FLU/RSV  testing. Fact Sheet for Patients: PinkCheek.be Fact Sheet for Healthcare Providers: GravelBags.it This test is not yet approved or cleared by the [place] FDA and  has been authorized for detection and/or diagnosis of SARS-CoV-2 by  FDA under an Emergency Use Authorization (EUA). This EUA will remain  in effect (meaning this test can be used) for the duration of the  Covid-19 declaration under Section 564(b)(1) of the Act, 21  U.S.C. section 360bbb-3(b)(1), unless the authorization is  terminated or revoked. Performed at [facility], [address] [place], [place] [address]   MRSA PCR Screening     Status: None   Collection Time: 08/05/19  1:41 AM   Specimen: Nasopharyngeal  Result Value Ref Range Status   MRSA by PCR NEGATIVE NEGATIVE Final    Comment:        The GeneXpert MRSA Assay (FDA approved for NASAL specimens only), is one component of a comprehensive MRSA colonization surveillance program. It  is not intended to diagnose MRSA infection nor to guide or monitor treatment for MRSA infections. Performed at [facility], [address] [place], [place] [address]          Radiology Studies: No results found.      Scheduled Meds: . aspirin EC  81 mg Oral Daily  . Chlorhexidine Gluconate Cloth  6 each Topical Daily  . docusate sodium  200 mg Oral BID  . enoxaparin (LOVENOX) injection  40 mg Subcutaneous Q12H  . furosemide  80 mg Intravenous Q12H  . Ensure Max Protein  11 oz Oral BID  . simvastatin  10 mg Oral Daily  . sodium chloride flush  3 mL Intravenous Q12H  . spironolactone  25 mg Oral Daily   Continuous Infusions: . sodium chloride       LOS: 3 days    Time spent: 33 mins      Wyvonnia Dusky, MD [facility] Pager [phone]  If 7PM-7AM, please contact night-coverage [email or web address] 08/07/2019, 2:32 PM

## 2019-08-07 NOTE — Progress Notes (Signed)
08/07/19 1700  Clinical Encounter Type  Visited With Patient  Visit Type Follow-up  Referral From Chaplain  Consult/Referral To Chaplain  Spiritual Encounters  Spiritual Needs Prayer;Emotional  Stress Factors  Patient Stress Factors Health changes   CH met with Pt as a FLU. Pt stated that he is feeling much better and asked for continued prayers. CH offered prayer for PT.
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
## 2019-08-07 NOTE — Progress Notes (Signed)
Patient transferred to room 132. Patient aware of transfer and notified someone on the phone about his transfer himself. Patient able to walk approximately 10 feet to new bed himself with his home cane with no assistance from RN. Gait steady (more so than in morning when transferred from bed to chair). All belongings sent with patient. Report given to nurse Jun on 1A who was physically present in patient's room while ICU RN and NT settled patient in new room.
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
## 2019-08-08 DIAGNOSIS — I5032 Chronic diastolic (congestive) heart failure: Secondary | ICD-10-CM

## 2019-08-08 LAB — CBC WITH DIFFERENTIAL/PLATELET
Abs Immature Granulocytes: 0.02 10*3/uL (ref 0.00–0.07)
Basophils Absolute: 0 10*3/uL (ref 0.0–0.1)
Basophils Relative: 1 %
Eosinophils Absolute: 0.2 10*3/uL (ref 0.0–0.5)
Eosinophils Relative: 2 %
HCT: 53.3 % — ABNORMAL HIGH (ref 39.0–52.0)
Hemoglobin: 14.3 g/dL (ref 13.0–17.0)
Immature Granulocytes: 0 %
Lymphocytes Relative: 21 %
Lymphs Abs: 1.4 10*3/uL (ref 0.7–4.0)
MCH: 21.1 pg — ABNORMAL LOW (ref 26.0–34.0)
MCHC: 26.8 g/dL — ABNORMAL LOW (ref 30.0–36.0)
MCV: 78.7 fL — ABNORMAL LOW (ref 80.0–100.0)
Monocytes Absolute: 0.7 10*3/uL (ref 0.1–1.0)
Monocytes Relative: 11 %
Neutro Abs: 4.4 10*3/uL (ref 1.7–7.7)
Neutrophils Relative %: 65 %
Platelets: 102 10*3/uL — ABNORMAL LOW (ref 150–400)
RBC: 6.77 MIL/uL — ABNORMAL HIGH (ref 4.22–5.81)
RDW: 22.2 % — ABNORMAL HIGH (ref 11.5–15.5)
WBC: 6.8 10*3/uL (ref 4.0–10.5)
nRBC: 0.3 % — ABNORMAL HIGH (ref 0.0–0.2)

## 2019-08-08 LAB — BASIC METABOLIC PANEL
Anion gap: 11 (ref 5–15)
BUN: 31 mg/dL — ABNORMAL HIGH (ref 8–23)
CO2: 35 mmol/L — ABNORMAL HIGH (ref 22–32)
Calcium: 8.5 mg/dL — ABNORMAL LOW (ref 8.9–10.3)
Chloride: 91 mmol/L — ABNORMAL LOW (ref 98–111)
Creatinine, Ser: 1.12 mg/dL (ref 0.61–1.24)
GFR calc Af Amer: >60 mL/min (ref >60)
GFR calc non Af Amer: >60 mL/min (ref >60)
Glucose, Bld: 80 mg/dL (ref 70–99)
Potassium: 4.4 mmol/L (ref 3.5–5.1)
Sodium: 137 mmol/L (ref 135–145)

## 2019-08-08 MED ORDER — BISACODYL 5 MG PO TBEC
10.0000 mg | DELAYED_RELEASE_TABLET | Freq: Every day | ORAL | Status: DC
  Administered 2019-08-08 – 2019-08-10 (×3): 10 mg via ORAL
  Filled 2019-08-08 (×3): qty 2

## 2019-08-08 NOTE — Progress Notes (Signed)
PROGRESS NOTE    Gary [name]  YKZ:[id number] DOB: 06-11-1955 DOA: 08/04/2019 PCP: [facility] [place]:   Active Problems:   Acute on chronic systolic CHF (congestive heart failure) ([name])   Acute respiratory failure ([name])  Acute on chronic diastolic CHF: echo on 9/39/0300 that showed an EF of 55 to 60% with LVH. Continue on IV lasix & aldactone as per cardio. Monitor I/Os. Cardio following and recs apprec. Continue on tele   Acute on chronic hypoxic and hypercarbic respiratory failure: secondary to with CHF exacerbation associated CO2 narcosis. Weaned off of BiPAP, continue on supplemental oxygen.   Dyslipidemia: continue statin  Obstructive sleep apnea:  likely with obesity hypoventilation syndrome.  Continue on BiPAP and wean as tolerated  Morbid obesity: BMI 46.7. Would benefit greatly from weight loss   Thrombocytopenia: etiology unclear. Will continue to monitor   Generalized weakness: PT/OT consulted    DVT prophylaxis: lovenox  Code Status: full  Family Communication: Disposition Plan: likely d/c back home but depends on PT/OT recs  Status is: Inpatient  Remains inpatient appropriate because:IV treatments appropriate due to intensity of illness or inability to take PO   Dispo: The patient is from: Home              Anticipated d/c is to: Home              Anticipated d/c date is: 2 days              Patient currently is not medically stable to d/c.          Consultants:   ICU  cardio  Procedures:    Antimicrobials:    Subjective: Pt c/o shortness of breath still but improved from day prior   Objective: Vitals:   08/08/19 0004 08/08/19 0029 08/08/19 0440 08/08/19 0500  BP: 122/86  111/77   Pulse: 72  78   Resp: 18  20   Temp: 98.6 F (37 C)  98.5 F (36.9 C)   TempSrc:   Oral   SpO2: 97% 96% 93%   Weight:    (!) 157.7 kg  Height:        Intake/Output Summary (Last 24 hours) at 08/08/2019  0731 Last data filed at 08/08/2019 9233 Gross per 24 hour  Intake 720 ml  Output 4550 ml  Net -3830 ml   Filed Weights   08/06/19 0500 08/07/19 0500 08/08/19 0500  Weight: (!) 145 kg (!) 156.4 kg (!) 157.7 kg    Examination:  General exam: Appears calm and comfortable  Respiratory system: decreased breath sounds b/l.  Cardiovascular system: S1 & S2 +. No rubs, gallops or clicks.  Gastrointestinal system: Abdomen is obese, soft and nontender.Normal bowel sounds heard. Central nervous system: Alert and oriented. Moves all 4 extremities  Psychiatry: Judgement and insight appear normal. Mood & affect appropriate.     Data Reviewed: I have personally reviewed following labs and imaging studies  CBC: Recent Labs  Lab 08/04/19 1412 08/05/19 0403 08/06/19 0459 08/07/19 0632 08/08/19 0306  WBC 5.5 5.4 5.1 6.4 6.8  NEUTROABS  --  2.9 3.1 4.2 4.4  HGB 14.1 14.2 14.3 14.7 14.3  HCT 53.5* 56.8* 55.3* 54.9* 53.3*  MCV 81.4 84.3 82.4 79.5* 78.7*  PLT 144* 103* 90* 100* 007*   Basic Metabolic Panel: Recent Labs  Lab 08/04/19 1412 08/05/19 0403 08/06/19 0459 08/07/19 0632 08/08/19 0306  NA 141 144 142 140 137  K 3.9 4.3 4.3 4.0 4.4  CL 100 99 97* 93* 91*  CO2 33* 35* 37* 38* 35*  GLUCOSE 107* 80 109* 86 80  BUN 22 20 24* 28* 31*  CREATININE 1.13 1.17 1.21 1.07 1.12  CALCIUM 8.1* 8.0* 8.1* 8.6* 8.5*   GFR: Estimated Creatinine Clearance: 110.1 mL/min (by C-G formula based on SCr of 1.12 mg/dL). Liver Function Tests: No results for input(s): AST, ALT, ALKPHOS, BILITOT, PROT, ALBUMIN in the last 168 hours. No results for input(s): LIPASE, AMYLASE in the last 168 hours. No results for input(s): AMMONIA in the last 168 hours. Coagulation Profile: No results for input(s): INR, PROTIME in the last 168 hours. Cardiac Enzymes: No results for input(s): CKTOTAL, CKMB, CKMBINDEX, TROPONINI in the last 168 hours. BNP (last 3 results) No results for input(s): PROBNP in the last  8760 hours. HbA1C: No results for input(s): HGBA1C in the last 72 hours. CBG: Recent Labs  Lab 08/05/19 0132  GLUCAP 94   Lipid Profile: No results for input(s): CHOL, HDL, LDLCALC, TRIG, CHOLHDL, LDLDIRECT in the last 72 hours. Thyroid Function Tests: No results for input(s): TSH, T4TOTAL, FREET4, T3FREE, THYROIDAB in the last 72 hours. Anemia Panel: No results for input(s): VITAMINB12, FOLATE, FERRITIN, TIBC, IRON, RETICCTPCT in the last 72 hours. Sepsis Labs: No results for input(s): PROCALCITON, LATICACIDVEN in the last 168 hours.  Recent Results (from the past 240 hour(s))  Respiratory Panel by RT PCR (Flu A&B, Covid) - Nasopharyngeal Swab     Status: None   Collection Time: 08/04/19  8:13 PM   Specimen: Nasopharyngeal Swab  Result Value Ref Range Status   SARS Coronavirus 2 by RT PCR NEGATIVE NEGATIVE Final    Comment: (NOTE) SARS-CoV-2 target nucleic acids are NOT DETECTED. The SARS-CoV-2 RNA is generally detectable in upper respiratoy specimens during the acute phase of infection. The lowest concentration of SARS-CoV-2 viral copies this assay can detect is 131 copies/mL. A negative result does not preclude SARS-Cov-2 infection and should not be used as the sole basis for treatment or other patient management decisions. A negative result may occur with  improper specimen collection/handling, submission of specimen other than nasopharyngeal swab, presence of viral mutation(s) within the areas targeted by this assay, and inadequate number of viral copies (<131 copies/mL). A negative result must be combined with clinical observations, patient history, and epidemiological information. The expected result is Negative. Fact Sheet for Patients:  PinkCheek.be Fact Sheet for Healthcare Providers:  GravelBags.it This test is not yet ap proved or cleared by the [place] FDA and  has been authorized for detection  and/or diagnosis of SARS-CoV-2 by FDA under an Emergency Use Authorization (EUA). This EUA will remain  in effect (meaning this test can be used) for the duration of the COVID-19 declaration under Section 564(b)(1) of the Act, 21 U.S.C. section 360bbb-3(b)(1), unless the authorization is terminated or revoked sooner.    Influenza A by PCR NEGATIVE NEGATIVE Final   Influenza B by PCR NEGATIVE NEGATIVE Final    Comment: (NOTE) The Xpert Xpress SARS-CoV-2/FLU/RSV assay is intended as an aid in  the diagnosis of influenza from Nasopharyngeal swab specimens and  should not be used as a sole basis for treatment. Nasal washings and  aspirates are unacceptable for Xpert Xpress SARS-CoV-2/FLU/RSV  testing. Fact Sheet for Patients: PinkCheek.be Fact Sheet for Healthcare Providers: GravelBags.it This test is not yet approved or cleared by the [place] FDA and  has been authorized for detection and/or diagnosis of SARS-CoV-2  by  FDA under an Emergency Use Authorization (EUA). This EUA will remain  in effect (meaning this test can be used) for the duration of the  Covid-19 declaration under Section 564(b)(1) of the Act, 21  U.S.C. section 360bbb-3(b)(1), unless the authorization is  terminated or revoked. Performed at [facility], [address] [place], [place] [address]   MRSA PCR Screening     Status: None   Collection Time: 08/05/19  1:41 AM   Specimen: Nasopharyngeal  Result Value Ref Range Status   MRSA by PCR NEGATIVE NEGATIVE Final    Comment:        The GeneXpert MRSA Assay (FDA approved for NASAL specimens only), is one component of a comprehensive MRSA colonization surveillance program. It is not intended to diagnose MRSA infection nor to guide or monitor treatment for MRSA infections. Performed at [facility], [address], [place], [place] [address]          Radiology  Studies: No results found.      Scheduled Meds: . aspirin EC  81 mg Oral Daily  . Chlorhexidine Gluconate Cloth  6 each Topical Daily  . docusate sodium  200 mg Oral BID  . enoxaparin (LOVENOX) injection  40 mg Subcutaneous Q12H  . furosemide  80 mg Intravenous Q12H  . Ensure Max Protein  11 oz Oral BID  . simvastatin  10 mg Oral Daily  . sodium chloride flush  3 mL Intravenous Q12H  . spironolactone  25 mg Oral Daily   Continuous Infusions: . sodium chloride       LOS: 4 days    Time spent: 35 mins     Wyvonnia Dusky, MD [facility] Pager [phone]  If 7PM-7AM, please contact night-coverage [email or web address] 08/08/2019, 7:31 AM

## 2019-08-08 NOTE — Progress Notes (Signed)
PT Cancellation Note  Patient Details Name: Gary [name] MRN: [id number] DOB: 06-30-1955   Cancelled Treatment:    Reason Eval/Treat Not Completed: Patient declined, no reason specified. Patient says "I don't need no PT. I had PT last Friday at my house. I just told the other therapy I don't need it. " Patient is annoyed that PT has orders to evaluate him .     [address] [place], [place] DPT 08/08/2019, 1:28 PM

## 2019-08-08 NOTE — Evaluation (Signed)
Occupational Therapy Evaluation Patient Details Name: Gary [name] MRN: [id number] DOB: [birth year] Today's Date: 08/08/2019    History of Present Illness Pt is 64 y/o M with PMH: hypertension, prior GI bleed, polysubstance abuse/cocaine/smoker, morbid obesity, chronic diastolic CHF, who presents with worsening leg swelling abdominal bloating and weight gain with shortness of breath.   Clinical Impression   Pt was seen for OT evaluation this date. Prior to hospital admission, pt was MOD I with fxl mobility with SPC. Pt lives with spouse in [facility] with 1 STE and has aide help for IADLs as well as bathing/grooming. Currently pt demonstrates impairments as described below (See OT problem list) which functionally limit his ability to perform ADL/self-care tasks.  Pt currently requires SBA from elevated surface with SPC for ADL transfers, performs UB ADLs in sitting with setup, requires MOD to MAX A with LB ADLs in sitting d/t limited LE ROM/limited dynamic sitting balance. Pt would benefit from skilled OT to address noted impairments and functional limitations (see below for any additional details) in order to maximize safety and independence while minimizing falls risk and caregiver burden. Upon hospital discharge, recommend HHOT to maximize pt safety and return to functional independence during meaningful occupations of daily life.     Follow Up Recommendations  Home health OT    Equipment Recommendations  3 in 1 bedside commode    Recommendations for Other Services       Precautions / Restrictions Precautions Precautions: Fall Restrictions Weight Bearing Restrictions: No      Mobility Bed Mobility Overal bed mobility: Modified Independent                Transfers Overall transfer level: Needs assistance Equipment used: Straight cane Transfers: Sit to/from Stand Sit to Stand: Supervision;From elevated surface              Balance Overall balance assessment: Mild  deficits observed, not formally tested                                         ADL either performed or assessed with clinical judgement   ADL                                         General ADL Comments: Pt requires MAX A for LB ADLs, SBA with SPC for ADL transfers, setup for seated UB g/h.     Vision Baseline Vision/History: Wears glasses Wears Glasses: Reading only Patient Visual Report: No change from baseline       Perception     Praxis      Pertinent Vitals/Pain Pain Assessment: No/denies pain     Hand Dominance     Extremity/Trunk Assessment Upper Extremity Assessment Upper Extremity Assessment: Generalized weakness;Overall [facility] for tasks assessed   Lower Extremity Assessment Lower Extremity Assessment: Defer to PT evaluation;Overall [facility] for tasks assessed;Generalized weakness       Communication Communication Communication: No difficulties   Cognition Arousal/Alertness: Awake/alert Behavior During Therapy: WFL for tasks assessed/performed Overall Cognitive Status: Within Functional Limits for tasks assessed                                     General Comments  Exercises Other Exercises Other Exercises: OT facilitates education re: importance of OOB activity and benefits of carryover with HH therapy. Pt somewhat agreeable to potential f/u, but expresses concerns re: f/u MD appointment scheduling.   Shoulder Instructions      Home Living Family/patient expects to be discharged to:: Private residence Living Arrangements: Spouse/significant other Available Help at Discharge: Family;Available PRN/intermittently;Personal care attendant(has PCA 80 hrs/wk) Type of Home: Apartment Home Access: Stairs to enter Entrance Stairs-Number of Steps: 1 Entrance Stairs-Rails: None Home Layout: One level     Bathroom Shower/Tub: Teacher, early years/pre: Handicapped height     Home Equipment: Cane -  quad;Cane - single point;Walker - 2 wheels          Prior Functioning/Environment Level of Independence: Needs assistance  Gait / Transfers Assistance Needed: walks with SPC, MOD I for fxl mobility. ADL's / Homemaking Assistance Needed: States that he has aide 80hrs/wk for bathing/dressing/cooking/cleaning. States wife is also able to perform household IADLs.   Comments: 2Lnc O2 at all times.        OT Problem List: Decreased strength;Decreased range of motion;Decreased activity tolerance;Cardiopulmonary status limiting activity;Obesity      OT Treatment/Interventions: Self-care/ADL training;Therapeutic exercise;Energy conservation;DME and/or AE instruction;Therapeutic activities;Balance training;Patient/family education    OT Goals(Current goals can be found in the care plan section) Acute Rehab OT Goals Patient Stated Goal: to get all this fluid off me and then go home OT Goal Formulation: With patient Time For Goal Achievement: 08/22/19 Potential to Achieve Goals: Good  OT Frequency: Min 1X/week   Barriers to D/C:            Co-evaluation              AM-PAC OT "6 Clicks" Daily Activity     Outcome Measure Help from another person eating meals?: None Help from another person taking care of personal grooming?: A Little Help from another person toileting, which includes using toliet, bedpan, or urinal?: A Lot Help from another person bathing (including washing, rinsing, drying)?: A Lot Help from another person to put on and taking off regular upper body clothing?: A Little Help from another person to put on and taking off regular lower body clothing?: A Lot 6 Click Score: 16   End of Session Equipment Utilized During Treatment: Gait belt;Other (comment)(pt's SPC) Nurse Communication: Mobility status  Activity Tolerance: Patient tolerated treatment well Patient left: in bed;with call bell/phone within reach;with bed alarm set  OT Visit Diagnosis: Muscle weakness  (generalized) (M62.81)                Time: 9179-1505 OT Time Calculation (min): 31 min Charges:  OT General Charges $OT Visit: 1 Visit OT Evaluation $OT Eval Moderate Complexity: 1 Mod OT Treatments $Self Care/Home Management : 8-22 mins  Gerrianne Scale, MS, OTR/L ascom [phone] 08/08/19, 1:50 PM

## 2019-08-08 NOTE — Progress Notes (Signed)
Subjective:  Less bloated making good urine   Objective:  Vitals:   08/08/19 0029 08/08/19 0440 08/08/19 0500 08/08/19 0835  BP:  111/77  98/78  Pulse:  78  80  Resp:  20  18  Temp:  98.5 F (36.9 C)  97.9 F (36.6 C)  TempSrc:  Oral    SpO2: 96% 93%  91%  Weight:   (!) 157.7 kg   Height:        Intake/Output from previous day:  Intake/Output Summary (Last 24 hours) at 08/08/2019 1059 Last data filed at 08/08/2019 1016 Gross per 24 hour  Intake 120 ml  Output 4050 ml  Net -3930 ml    Physical Exam:  Affect appropriate Obese black male  HEENT: normal Neck supple with no adenopathy JVP normal no bruits no thyromegaly Lungs clear with no wheezing and good diaphragmatic motion Heart:  S1/S2 no murmur, no rub, gallop or click PMI normal Abdomen: benighn, BS positve, no tenderness, no AAA no bruit.  No HSM or HJR Distal pulses intact with no bruits Plus 2 LE  edema Neuro non-focal Skin warm and dry No muscular weakness   Lab Results: Basic Metabolic Panel: Recent Labs    08/07/19 0632 08/08/19 0306  NA 140 137  K 4.0 4.4  CL 93* 91*  CO2 38* 35*  GLUCOSE 86 80  BUN 28* 31*  CREATININE 1.07 1.12  CALCIUM 8.6* 8.5*   Liver Function Tests: No results for input(s): AST, ALT, ALKPHOS, BILITOT, PROT, ALBUMIN in the last 72 hours. No results for input(s): LIPASE, AMYLASE in the last 72 hours. CBC: Recent Labs    08/07/19 0632 08/08/19 0306  WBC 6.4 6.8  NEUTROABS 4.2 4.4  HGB 14.7 14.3  HCT 54.9* 53.3*  MCV 79.5* 78.7*  PLT 100* 102*   Cardiac Enzymes: No results for input(s): CKTOTAL, CKMB, CKMBINDEX, TROPONINI in the last 72 hours. BNP: Invalid input(s): POCBNP D-Dimer: No results for input(s): DDIMER in the last 72 hours. Hemoglobin A1C: No results for input(s): HGBA1C in the last 72 hours. Fasting Lipid Panel: No results for input(s): CHOL, HDL, LDLCALC, TRIG, CHOLHDL, LDLDIRECT in the last 72 hours. Thyroid Function Tests: No results  for input(s): TSH, T4TOTAL, T3FREE, THYROIDAB in the last 72 hours.  Invalid input(s): FREET3 Anemia Panel: No results for input(s): VITAMINB12, FOLATE, FERRITIN, TIBC, IRON, RETICCTPCT in the last 72 hours.  Imaging: No results found.  Cardiac Studies:  ECG:    Telemetry:  NSR 08/08/2019   Echo: 05/27/19 EF 33-29% no diastolic evaluation no significant valve disease   Medications:   . aspirin EC  81 mg Oral Daily  . bisacodyl  10 mg Oral Daily  . Chlorhexidine Gluconate Cloth  6 each Topical Daily  . docusate sodium  200 mg Oral BID  . enoxaparin (LOVENOX) injection  40 mg Subcutaneous Q12H  . furosemide  80 mg Intravenous Q12H  . Ensure Max Protein  11 oz Oral BID  . simvastatin  10 mg Oral Daily  . sodium chloride flush  3 mL Intravenous Q12H  . spironolactone  25 mg Oral Daily     . sodium chloride      Assessment/Plan:   1. Diastolic  CHF:  BNP 518  Vs obesity and salt intake Had over 3L diuresis with iv lasix and aldactone Will need another 48 hours of diuresis and likely d/c with demedex 40 mg PO bid instead of lasix   2. HLD:  Continue statin  3. Substance Abuse:  Not clear that he has abstained history of cocaine use active smoker   4. Obesity: inpatient dietician consult   5. Polycythemia:elevated Hct with chronic CO2 retention arterial PO2 82.  Not clear if this is elevated from chronic respiratory failure, poorly Rx OSA, or P vera consider checking RBC mass   Jenkins Rouge 08/08/2019, 10:59 AM

## 2019-08-08 NOTE — Discharge Instructions (Signed)
Low Sodium Nutrition Therapy  Eating less sodium can help you if you have high blood pressure, heart failure, or kidney or liver disease.   Your body needs a little sodium, but too much sodium can cause your body to hold onto extra water. This extra water will raise your blood pressure and can cause damage to your heart, kidneys, or liver as they are forced to work harder.   Sometimes you can see how the extra fluid affects you because your hands, legs, or belly swell. You may also hold water around your heart and lungs, which makes it hard to breathe.   Even if you take medication for blood pressure or a water pill (diuretic) to remove fluid, it is still important to have less salt in your diet.   Check with your primary care provider before drinking alcohol since it may affect the amount of fluid in your body and how your heart, kidneys, or liver work. Sodium in Food A low-sodium meal plan limits the sodium that you get from food and beverages to 1,500-2,000 milligrams (mg) per day. Salt is the main source of sodium. Read the nutrition label on the package to find out how much sodium is in one serving of a food.  . Select foods with 140 milligrams (mg) of sodium or less per serving.  . You may be able to eat one or two servings of foods with a little more than 140 milligrams (mg) of sodium if you are closely watching how much sodium you eat in a day.  . Check the serving size on the label. The amount of sodium listed on the label shows the amount in one serving of the food. So, if you eat more than one serving, you will get more sodium than the amount listed.  Tips Cutting Back on Sodium . Eat more fresh foods.  . Fresh fruits and vegetables are low in sodium, as well as frozen vegetables and fruits that have no added juices or sauces.  . Fresh meats are lower in sodium than processed meats, such as bacon, sausage, and hotdogs.  . Not all processed foods are unhealthy, but some processed foods  may have too much sodium.  . Eat less salt at the table and when cooking. One of the ingredients in salt is sodium.  . One teaspoon of table salt has 2,300 milligrams of sodium.  . Leave the salt out of recipes for pasta, casseroles, and soups. . Be a smart shopper.  . Food packages that say "Salt-free", sodium-free", "very low sodium," and "low sodium" have less than 140 milligrams of sodium per serving.  . Beware of products identified as "Unsalted," "No Salt Added," "Reduced Sodium," or "Lower Sodium." These items may still be high in sodium. You should always check the nutrition label. . Add flavors to your food without adding sodium.  . Try lemon juice, lime juice, or vinegar.  . Dry or fresh herbs add flavor.  . Buy a sodium-free seasoning blend or make your own at home. . You can purchase salt-free or sodium-free condiments like barbeque sauce in stores and online. Ask your registered dietitian nutritionist for recommendations and where to find them.  .  Eating in Restaurants . Choose foods carefully when you eat outside your home. Restaurant foods can be very high in sodium. Many restaurants provide nutrition facts on their menus or their websites. If you cannot find that information, ask your server. Let your server know that you want your food   to be cooked without salt and that you would like your salad dressing and sauces to be served on the side.  .   . Foods Recommended . Food Group . Foods Recommended  . Grains . Bread, bagels, rolls without salted tops Homemade bread made with reduced-sodium baking powder Cold cereals, especially shredded wheat and puffed rice Oats, grits, or cream of wheat Pastas, quinoa, and rice Popcorn, pretzels or crackers without salt Corn tortillas  . Protein Foods . Fresh meats and fish; turkey bacon (check the nutrition labels - make sure they are not packaged in a sodium solution) Canned or packed tuna (no more than 4 ounces at 1 serving) Beans  and peas Soybeans) and tofu Eggs Nuts or nut butters without salt  . Dairy . Milk or milk powder Plant milks, such as rice and soy Yogurt, including Greek yogurt Small amounts of natural cheese (blocks of cheese) or reduced-sodium cheese can be used in moderation. (Swiss, ricotta, and fresh mozzarella cheese are lower in sodium than the others) Cream Cheese Low sodium cottage cheese  . Vegetables . Fresh and frozen vegetables without added sauces or salt Homemade soups (without salt) Low-sodium, salt-free or sodium-free canned vegetables and soups  . Fruit . Fresh and canned fruits Dried fruits, such as raisins, cranberries, and prunes  . Oils . Tub or liquid margarine, regular or without salt Canola, corn, peanut, olive, safflower, or sunflower oils  . Condiments . Fresh or dried herbs such as basil, bay leaf, dill, mustard (dry), nutmeg, paprika, parsley, rosemary, sage, or thyme.  Low sodium ketchup Vinegar  Lemon or lime juice Pepper, red pepper flakes, and cayenne. Hot sauce contains sodium, but if you use just a drop or two, it will not add up to much.  Salt-free or sodium-free seasoning mixes and marinades Simple salad dressings: vinegar and oil  .  . Foods Not Recommended . Food Group . Foods Not Recommended  . Grains . Breads or crackers topped with salt Cereals (hot/cold) with more than 300 mg sodium per serving Biscuits, cornbread, and other "quick" breads prepared with baking soda Pre-packaged bread crumbs Seasoned and packaged rice and pasta mixes Self-rising flours  . Protein Foods . Cured meats: Bacon, ham, sausage, pepperoni and hot dogs Canned meats (chili, vienna sausage, or sardines) Smoked fish and meats Frozen meals that have more than 600 mg of sodium per serving Egg substitute (with added sodium)  . Dairy . Buttermilk Processed cheese spreads Cottage cheese (1 cup may have over 500 mg of sodium; look for low-sodium.) American or feta cheese Shredded  Cheese has more sodium than blocks of cheese String cheese  . Vegetables . Canned vegetables (unless they are salt-free, sodium-free or low sodium) Frozen vegetables with seasoning and sauces Sauerkraut and pickled vegetables Canned or dried soups (unless they are salt-free, sodium-free, or low sodium) French fries and onion rings  . Fruit . Dried fruits preserved with additives that have sodium  . Oils . Salted butter or margarine, all types of olives  . Condiments . Salt, sea salt, kosher salt, onion salt, and garlic salt Seasoning mixes with salt Bouillon cubes Ketchup Barbeque sauce and Worcestershire sauce unless low sodium Soy sauce Salsa, pickles, olives, relish Salad dressings: ranch, blue cheese, Italian, and French.  .  . Low Sodium Sample 1-Day Menu  . Breakfast . 1 cup cooked oatmeal  . 1 slice whole wheat bread toast  . 1 tablespoon peanut butter without salt  . 1 banana  .   1 cup 1% milk  . Lunch . Tacos made with: 2 corn tortillas  .  cup black beans, low sodium  .  cup roasted or grilled chicken (without skin)  .  avocado  . Squeeze of lime juice  . 1 cup salad greens  . 1 tablespoon low-sodium salad dressing  .  cup strawberries  . 1 orange  . Afternoon Snack . 1/3 cup grapes  . 6 ounces yogurt  . Evening Meal . 3 ounces herb-baked fish  . 1 baked potato  . 2 teaspoons olive oil  .  cup cooked carrots  . 2 thick slices tomatoes on:  . 2 lettuce leaves  . 1 teaspoon olive oil  . 1 teaspoon balsamic vinegar  . 1 cup 1% milk  . Evening Snack . 1 apple  .  cup almonds without salt  .  . Low-Sodium Vegetarian (Lacto-Ovo) Sample 1-Day Menu  . Breakfast . 1 cup cooked oatmeal  . 1 slice whole wheat toast  . 1 tablespoon peanut butter without salt  . 1 banana  . 1 cup 1% milk  . Lunch . Tacos made with: 2 corn tortillas  .  cup black beans, low sodium  .  cup roasted or grilled chicken (without skin)  .  avocado  . Squeeze of lime juice  . 1  cup salad greens  . 1 tablespoon low-sodium salad dressing  .  cup strawberries  . 1 orange  . Evening Meal . Stir fry made with:  cup tofu  . 1 cup brown rice  .  cup broccoli  .  cup green beans  .  cup peppers  .  tablespoon peanut oil  . 1 orange  . 1 cup 1% milk  . Evening Snack . 4 strips celery  . 2 tablespoons hummus  . 1 hard-boiled egg  .  . Low-Sodium Vegan Sample 1-Day Menu  . Breakfast . 1 cup cooked oatmeal  . 1 tablespoon peanut butter without salt  . 1 cup blueberries  . 1 cup soymilk fortified with calcium, vitamin B12, and vitamin D  . Lunch . 1 small whole wheat pita  .  cup cooked lentils  . 2 tablespoons hummus  . 4 carrot sticks  . 1 medium apple  . 1 cup soymilk fortified with calcium, vitamin B12, and vitamin D  . Evening Meal . Stir fry made with:  cup tofu  . 1 cup brown rice  .  cup broccoli  .  cup green beans  .  cup peppers  .  tablespoon peanut oil  . 1 cup cantaloupe  . Evening Snack . 1 cup soy yogurt  .  cup mixed nuts  . Copyright 2020  Academy of [employer] and [employer]etics. All rights reserved .  . Sodium Free Flavoring Tips .  . When cooking, the following items may be used for flavoring instead of salt or seasonings that contain sodium. . Remember: A little bit of spice goes a long way! Be careful not to overseason. . Spice Blend Recipe (makes about ? cup) . 5 teaspoons onion powder  . 2 teaspoons garlic powder  . 2 teaspoons paprika  . 2 teaspoon dry mustard  . 1 teaspoon crushed thyme leaves  .  teaspoon white pepper  .  teaspoon celery seed Food Item Flavorings  Beef Basil, bay leaf, caraway, curry, dill, dry mustard, garlic, grape jelly, green pepper, mace, marjoram, mushrooms (fresh), nutmeg, onion or onion powder, parsley, pepper,   rosemary, sage  Chicken Basil, cloves, cranberries, mace, mushrooms (fresh), nutmeg, oregano, paprika, parsley, pineapple, saffron, sage, savory, tarragon, thyme, tomato,  turmeric  Egg Chervil, curry, dill, dry mustard, garlic or garlic powder, green pepper, jelly, mushrooms (fresh), nutmeg, onion powder, paprika, parsley, rosemary, tarragon, tomato  Fish Basil, bay leaf, chervil, curry, dill, dry mustard, green pepper, lemon juice, marjoram, mushrooms (fresh), paprika, pepper, tarragon, tomato, turmeric  Lamb Cloves, curry, dill, garlic or garlic powder, mace, mint, mint jelly, onion, oregano, parsley, pineapple, rosemary, tarragon, thyme  Pork Applesauce, basil, caraway, chives, cloves, garlic or garlic powder, onion or onion powder, rosemary, thyme  Veal Apricots, basil, bay leaf, currant jelly, curry, ginger, marjoram, mushrooms (fresh), oregano, paprika  Vegetables Basil, dill, garlic or garlic powder, ginger, lemon juice, mace, marjoram, nutmeg, onion or onion powder, tarragon, tomato, sugar or sugar substitute, salt-free salad dressing, vinegar  Desserts Allspice, anise, cinnamon, cloves, ginger, mace, nutmeg, vanilla extract, other extracts   Copyright 2020  Academy of [employer] and Dietetics. All rights reserved  Fluid Restricted Nutrition Therapy  You have been prescribed this diet because your condition affects how much fluid you can eat or drink. If your heart, liver, or kidneys aren't working properly, you may not be able to effectively eliminate fluids from the body and this may cause swelling (edema) in the legs, arms, and/or stomach. Drink no more than 1.8 liters or ____60____ ounces or ____7.5____cups of fluid per day.  . You don't need to stop eating or drinking the same fluids you normally would, but you may need to eat or drink less than usual.  . Your registered dietitian nutritionist will help you determine the correct amount of fluid to consume during the day Breakfast Include fluids taken with medications  Lunch Include fluids taken with medications  Dinner Include fluids taken with medications  Bedtime Snack Include fluids taken with  medications     Tips What Are Fluids?  A fluid is anything that is liquid or anything that would melt if left at room temperature. You will need to count these foods and liquids--including any liquid used to take medication--as part of your daily fluid intake. Some examples are: . Alcohol (drink only with your doctor's permission)  . Coffee, tea, and other hot beverages  . Gelatin (Jell-O)  . Gravy  . Ice cream, sherbet, sorbet  . Ice cubes, ice chips  . Milk, liquid creamer  . Nutritional supplements  . Popsicles  . Vegetable and fruit juices; fluid in canned fruit  . Watermelon  . Yogurt  . Soft drinks, lemonade, limeade  . Soups  . Syrup How Do I Measure My Fluid Intake? [name] Record your fluid intake daily.  . Tip: Every day, each time you eat or drink fluids, pour water in the same amount into an empty container that can hold the same amount of fluids you are allowed daily. This may help you keep track of how much fluid you are taking in throughout the day.  . To accurately keep track of how much liquid you take in, measure the size of the cups, glasses, and bowls you use. If you eat soup, measure how much of it is liquid and how much is solid (such as noodles, vegetables, meat). Conversions for Measuring Fluid Intake  Milliliters (mL) Liters (L) Ounces (oz) Cups (c)  1000 1 32 4  1200 1.2 40 5  1500 1.5 50 6 1/4  1800 1.8 60 7 1/2  2000 2 67 8 1/3  Tips to Reduce Your Thirst . Chew gum or suck on hard candy.  . Rinse or gargle with mouthwash. Do not swallow.  . Ice chips or popsicles my help quench thirst, but this too needs to be calculated into the total restriction. Melt ice chips or cubes first to figure out how much fluid they produce (for example, experiment with melting  cup ice chips or 2 ice cubes).  . Add a lemon wedge to your water.  . Limit how much salt you take in. A high salt intake might make you thirstier.  . Don't eat or drink all your allowed liquids at  once. Space your liquids out through the day.  . Use small glasses and cups and sip slowly. If allowed, take your medications with fluids you eat or drink during a meal.   Fluid-Restricted Nutrition Therapy Sample 1-Day Menu  Breakfast 1 slice wheat toast  1 tablespoon peanut butter  1/2 cup yogurt (120 milliliters)  1/2 cup blueberries  1 cup milk (240 milliliters)   Lunch 3 ounces sliced [place]  2 slices whole wheat bread  1/2 cup lettuce for sandwich  2 slices tomato for sandwich  1 ounce reduced-fat, reduced-sodium cheese  1/2 cup fresh carrot sticks  1 banana  1 cup unsweetened tea (240 milliliters)   Evening Meal 8 ounces soup (240 milliliters)  3 ounces salmon  1/2 cup quinoa  1 cup green beans  1 cup mixed greens salad  1 tablespoon olive oil  1 cup coffee (240 milliliters)  Evening Snack 1/2 cup sliced peaches  1/2 cup frozen yogurt (120 milliliters)  1 cup water (240 milliliters)  Copyright 2020  Academy of [employer] and Dietetics. All rights reserved  [name] Parker, MS, RD, LDN Clinical Dietitian Office phone [phone]

## 2019-08-08 NOTE — Plan of Care (Signed)

## 2019-08-08 NOTE — Plan of Care (Signed)
Nutrition Education Note  RD consulted for nutrition diet education.   RD working remotely. Pt contacted via inpatient room phone. Handout "Low Sodium Nutrition Therapy" handout from the [employer] placed in pt discharge instructions. Reviewed patient's dietary recall. Provided examples on ways to decrease sodium intake in diet. Discouraged intake of processed foods and use of salt shaker. Encouraged fresh fruits and vegetables as well as whole grain sources of carbohydrates to maximize fiber intake.   RD discussed why it is important for patient to adhere to diet recommendations, emphasized the role of fluids. Pt knowledgeable on fluid restriction. Teach back method used.  Expect good compliance.  Current diet order is heart diet with 1800 ml fluid restriction, patient is consuming approximately 100% of meals at this time. Labs and medications reviewed. No further nutrition interventions warranted at this time. RD contact information provided. If additional nutrition issues arise, please re-consult RD.   [name] Parker, MS, RD, LDN RD pager number/after hours weekend pager number on Amion.

## 2019-08-09 DIAGNOSIS — I509 Heart failure, unspecified: Secondary | ICD-10-CM

## 2019-08-09 LAB — CBC WITH DIFFERENTIAL/PLATELET
Abs Immature Granulocytes: 0.01 10*3/uL (ref 0.00–0.07)
Basophils Absolute: 0 10*3/uL (ref 0.0–0.1)
Basophils Relative: 1 %
Eosinophils Absolute: 0.1 10*3/uL (ref 0.0–0.5)
Eosinophils Relative: 2 %
HCT: 56.9 % — ABNORMAL HIGH (ref 39.0–52.0)
Hemoglobin: 14.9 g/dL (ref 13.0–17.0)
Immature Granulocytes: 0 %
Lymphocytes Relative: 24 %
Lymphs Abs: 1.4 10*3/uL (ref 0.7–4.0)
MCH: 21.4 pg — ABNORMAL LOW (ref 26.0–34.0)
MCHC: 26.2 g/dL — ABNORMAL LOW (ref 30.0–36.0)
MCV: 81.6 fL (ref 80.0–100.0)
Monocytes Absolute: 0.8 10*3/uL (ref 0.1–1.0)
Monocytes Relative: 15 %
Neutro Abs: 3.3 10*3/uL (ref 1.7–7.7)
Neutrophils Relative %: 58 %
Platelets: 103 10*3/uL — ABNORMAL LOW (ref 150–400)
RBC: 6.97 MIL/uL — ABNORMAL HIGH (ref 4.22–5.81)
RDW: 22.2 % — ABNORMAL HIGH (ref 11.5–15.5)
WBC: 5.7 10*3/uL (ref 4.0–10.5)
nRBC: 0.3 % — ABNORMAL HIGH (ref 0.0–0.2)

## 2019-08-09 LAB — BASIC METABOLIC PANEL
Anion gap: 9 (ref 5–15)
BUN: 36 mg/dL — ABNORMAL HIGH (ref 8–23)
CO2: 38 mmol/L — ABNORMAL HIGH (ref 22–32)
Calcium: 8.8 mg/dL — ABNORMAL LOW (ref 8.9–10.3)
Chloride: 91 mmol/L — ABNORMAL LOW (ref 98–111)
Creatinine, Ser: 1.17 mg/dL (ref 0.61–1.24)
GFR calc Af Amer: >60 mL/min (ref >60)
GFR calc non Af Amer: >60 mL/min (ref >60)
Glucose, Bld: 79 mg/dL (ref 70–99)
Potassium: 4.5 mmol/L (ref 3.5–5.1)
Sodium: 138 mmol/L (ref 135–145)

## 2019-08-09 NOTE — Progress Notes (Signed)
PT Cancellation Note  Patient Details Name: CHIMA ASTORINO MRN: [id number] DOB: 22-Sep-1955   Cancelled Treatment:    Reason Eval/Treat Not Completed: Patient declined, no reason specified. Patient reports that he does not need PT and order will be completed due to patient being alert and orientated and refusing PT evaluation x 2 days.    Alanson Puls, [place] DPT 08/09/2019, 9:21 AM

## 2019-08-09 NOTE — Plan of Care (Signed)

## 2019-08-09 NOTE — Progress Notes (Signed)
Patient wore bipap for appx 30 min then decided he didn't want to wear any more. Placed back on 2 liter nasal cannula

## 2019-08-09 NOTE — Progress Notes (Signed)
PROGRESS NOTE    Gary [name]  UVO:[id number] DOB: 06/14/1955 DOA: 08/04/2019 PCP: [facility] [place]:   Active Problems:   Acute on chronic systolic CHF (congestive heart failure) ([name])   Acute respiratory failure ([name])  Acute on chronic diastolic CHF: echo on 7/42/5956 that showed an EF of 55 to 60% with LVH. Continue on IV lasix & aldactone as per cardio. Monitor I/Os. Cardio following and recs apprec. Continue on tele   Acute on chronic hypoxic and hypercarbic respiratory failure: secondary to with CHF exacerbation associated CO2 narcosis. Weaned off of BiPAP, continue on supplemental oxygen, currently 2L [place]  Dyslipidemia: continue statin  Obstructive sleep apnea:  likely with obesity hypoventilation syndrome.  Continue on BiPAP and wean as tolerated  Morbid obesity: BMI 46.7. Would benefit greatly from weight loss   Thrombocytopenia: etiology unclear. Will continue to monitor   Generalized weakness: OT home health. PT consulted   DVT prophylaxis: lovenox  Code Status: full  Family Communication: Disposition Plan: likely d/c home w/ home health  Status is: Inpatient  Remains inpatient appropriate because:IV treatments appropriate due to intensity of illness or inability to take PO   Dispo: The patient is from: Home              Anticipated d/c is to: Home              Anticipated d/c date is: 2 days              Patient currently is not medically stable to d/c.          Consultants:   ICU  cardio  Procedures:    Antimicrobials:    Subjective: Pt c/o malaise  Objective: Vitals:   08/08/19 1652 08/08/19 2100 08/08/19 2339 08/09/19 0500  BP:  114/64 113/79   Pulse: 73 75 74   Resp:  16 16   Temp:  97.6 F (36.4 C) (!) 97.5 F (36.4 C)   TempSrc:  Oral Oral   SpO2: 94% 95% 96%   Weight:    (!) 154.2 kg  Height:        Intake/Output Summary (Last 24 hours) at 08/09/2019 0724 Last data filed at  08/09/2019 3875 Gross per 24 hour  Intake 363 ml  Output 2125 ml  Net -1762 ml   Filed Weights   08/07/19 0500 08/08/19 0500 08/09/19 0500  Weight: (!) 156.4 kg (!) 157.7 kg (!) 154.2 kg    Examination:  General exam: Appears calm and comfortable  Respiratory system: diminished breath sounds b/l. No wheezes Cardiovascular system: S1 & S2 +. No rubs, gallops or clicks.  Gastrointestinal system: Abdomen is obese, soft and nontender. Hypoactive bowel sounds heard. Central nervous system: Alert and oriented. Moves all 4 extremities  Psychiatry: Judgement and insight appear normal. Mood & affect appropriate.     Data Reviewed: I have personally reviewed following labs and imaging studies  CBC: Recent Labs  Lab 08/05/19 0403 08/06/19 0459 08/07/19 0632 08/08/19 0306 08/09/19 0347  WBC 5.4 5.1 6.4 6.8 5.7  NEUTROABS 2.9 3.1 4.2 4.4 3.3  HGB 14.2 14.3 14.7 14.3 14.9  HCT 56.8* 55.3* 54.9* 53.3* 56.9*  MCV 84.3 82.4 79.5* 78.7* 81.6  PLT 103* 90* 100* 102* 643*   Basic Metabolic Panel: Recent Labs  Lab 08/05/19 0403 08/06/19 0459 08/07/19 0632 08/08/19 0306 08/09/19 0347  NA 144 142 140 137 138  K 4.3 4.3 4.0 4.4 4.5  CL 99 97* 93* 91* 91*  CO2 35* 37* 38* 35* 38*  GLUCOSE 80 109* 86 80 79  BUN 20 24* 28* 31* 36*  CREATININE 1.17 1.21 1.07 1.12 1.17  CALCIUM 8.0* 8.1* 8.6* 8.5* 8.8*   GFR: Estimated Creatinine Clearance: 104.1 mL/min (by C-G formula based on SCr of 1.17 mg/dL). Liver Function Tests: No results for input(s): AST, ALT, ALKPHOS, BILITOT, PROT, ALBUMIN in the last 168 hours. No results for input(s): LIPASE, AMYLASE in the last 168 hours. No results for input(s): AMMONIA in the last 168 hours. Coagulation Profile: No results for input(s): INR, PROTIME in the last 168 hours. Cardiac Enzymes: No results for input(s): CKTOTAL, CKMB, CKMBINDEX, TROPONINI in the last 168 hours. BNP (last 3 results) No results for input(s): PROBNP in the last 8760  hours. HbA1C: No results for input(s): HGBA1C in the last 72 hours. CBG: Recent Labs  Lab 08/05/19 0132  GLUCAP 94   Lipid Profile: No results for input(s): CHOL, HDL, LDLCALC, TRIG, CHOLHDL, LDLDIRECT in the last 72 hours. Thyroid Function Tests: No results for input(s): TSH, T4TOTAL, FREET4, T3FREE, THYROIDAB in the last 72 hours. Anemia Panel: No results for input(s): VITAMINB12, FOLATE, FERRITIN, TIBC, IRON, RETICCTPCT in the last 72 hours. Sepsis Labs: No results for input(s): PROCALCITON, LATICACIDVEN in the last 168 hours.  Recent Results (from the past 240 hour(s))  Respiratory Panel by RT PCR (Flu A&B, Covid) - Nasopharyngeal Swab     Status: None   Collection Time: 08/04/19  8:13 PM   Specimen: Nasopharyngeal Swab  Result Value Ref Range Status   SARS Coronavirus 2 by RT PCR NEGATIVE NEGATIVE Final    Comment: (NOTE) SARS-CoV-2 target nucleic acids are NOT DETECTED. The SARS-CoV-2 RNA is generally detectable in upper respiratoy specimens during the acute phase of infection. The lowest concentration of SARS-CoV-2 viral copies this assay can detect is 131 copies/mL. A negative result does not preclude SARS-Cov-2 infection and should not be used as the sole basis for treatment or other patient management decisions. A negative result may occur with  improper specimen collection/handling, submission of specimen other than nasopharyngeal swab, presence of viral mutation(s) within the areas targeted by this assay, and inadequate number of viral copies (<131 copies/mL). A negative result must be combined with clinical observations, patient history, and epidemiological information. The expected result is Negative. Fact Sheet for Patients:  PinkCheek.be Fact Sheet for Healthcare Providers:  GravelBags.it This test is not yet ap proved or cleared by the [place] FDA and  has been authorized for detection and/or  diagnosis of SARS-CoV-2 by FDA under an Emergency Use Authorization (EUA). This EUA will remain  in effect (meaning this test can be used) for the duration of the COVID-19 declaration under Section 564(b)(1) of the Act, 21 U.S.C. section 360bbb-3(b)(1), unless the authorization is terminated or revoked sooner.    Influenza A by PCR NEGATIVE NEGATIVE Final   Influenza B by PCR NEGATIVE NEGATIVE Final    Comment: (NOTE) The Xpert Xpress SARS-CoV-2/FLU/RSV assay is intended as an aid in  the diagnosis of influenza from Nasopharyngeal swab specimens and  should not be used as a sole basis for treatment. Nasal washings and  aspirates are unacceptable for Xpert Xpress SARS-CoV-2/FLU/RSV  testing. Fact Sheet for Patients: PinkCheek.be Fact Sheet for Healthcare Providers: GravelBags.it This test is not yet approved or cleared by the [place] FDA and  has been authorized for detection and/or diagnosis of SARS-CoV-2 by  FDA under an Emergency Use  Authorization (EUA). This EUA will remain  in effect (meaning this test can be used) for the duration of the  Covid-19 declaration under Section 564(b)(1) of the Act, 21  U.S.C. section 360bbb-3(b)(1), unless the authorization is  terminated or revoked. Performed at [facility], [address] [place], [place] [address]   MRSA PCR Screening     Status: None   Collection Time: 08/05/19  1:41 AM   Specimen: Nasopharyngeal  Result Value Ref Range Status   MRSA by PCR NEGATIVE NEGATIVE Final    Comment:        The GeneXpert MRSA Assay (FDA approved for NASAL specimens only), is one component of a comprehensive MRSA colonization surveillance program. It is not intended to diagnose MRSA infection nor to guide or monitor treatment for MRSA infections. Performed at [facility], [address] [place],  [address]          Radiology Studies: No  results found.      Scheduled Meds: . aspirin EC  81 mg Oral Daily  . bisacodyl  10 mg Oral Daily  . Chlorhexidine Gluconate Cloth  6 each Topical Daily  . docusate sodium  200 mg Oral BID  . enoxaparin (LOVENOX) injection  40 mg Subcutaneous Q12H  . furosemide  80 mg Intravenous Q12H  . Ensure Max Protein  11 oz Oral BID  . simvastatin  10 mg Oral Daily  . sodium chloride flush  3 mL Intravenous Q12H  . spironolactone  25 mg Oral Daily   Continuous Infusions: . sodium chloride       LOS: 5 days    Time spent: 30 mins     Wyvonnia Dusky, MD [facility] Pager [phone]  If 7PM-7AM, please contact night-coverage [email or web address] 08/09/2019, 7:24 AM

## 2019-08-09 NOTE — Progress Notes (Signed)
Pt refused cpap

## 2019-08-09 NOTE — Progress Notes (Signed)
    Subjective:  Less edema and still making good urine  He declined PT  Objective:  Vitals:   08/08/19 2100 08/08/19 2339 08/09/19 0500 08/09/19 0814  BP: 114/64 113/79  121/84  Pulse: 75 74  76  Resp: 16 16  20   Temp: 97.6 F (36.4 C) (!) 97.5 F (36.4 C)  98.5 F (36.9 C)  TempSrc: Oral Oral  Oral  SpO2: 95% 96%  92%  Weight:   (!) 154.2 kg   Height:        Intake/Output from previous day:  Intake/Output Summary (Last 24 hours) at 08/09/2019 1202 Last data filed at 08/09/2019 1100 Gross per 24 hour  Intake 483 ml  Output 2250 ml  Net -1767 ml    Physical Exam:  Affect appropriate Obese black male  HEENT: normal Neck supple with no adenopathy JVP normal no bruits no thyromegaly Lungs clear with no wheezing and good diaphragmatic motion Heart:  S1/S2 no murmur, no rub, gallop or click PMI normal Abdomen: benighn, BS positve, no tenderness, no AAA no bruit.  No HSM or HJR Distal pulses intact with no bruits Plus 2 LE  edema Neuro non-focal Skin warm and dry No muscular weakness   Lab Results: Basic Metabolic Panel: Recent Labs    08/08/19 0306 08/09/19 0347  NA 137 138  K 4.4 4.5  CL 91* 91*  CO2 35* 38*  GLUCOSE 80 79  BUN 31* 36*  CREATININE 1.12 1.17  CALCIUM 8.5* 8.8*   CBC: Recent Labs    08/08/19 0306 08/09/19 0347  WBC 6.8 5.7  NEUTROABS 4.4 3.3  HGB 14.3 14.9  HCT 53.3* 56.9*  MCV 78.7* 81.6  PLT 102* 103*    Imaging: No results found.  Cardiac Studies:  ECG: SR LAE nonspecific ST changes    Telemetry:  NSR 08/09/2019   Echo: 05/27/19 EF 00-92% no diastolic evaluation no significant valve disease   Medications:   . aspirin EC  81 mg Oral Daily  . bisacodyl  10 mg Oral Daily  . Chlorhexidine Gluconate Cloth  6 each Topical Daily  . docusate sodium  200 mg Oral BID  . enoxaparin (LOVENOX) injection  40 mg Subcutaneous Q12H  . furosemide  80 mg Intravenous Q12H  . Ensure Max Protein  11 oz Oral BID  . simvastatin  10 mg  Oral Daily  . sodium chloride flush  3 mL Intravenous Q12H  . spironolactone  25 mg Oral Daily     . sodium chloride      Assessment/Plan:   1. Diastolic  CHF:  BNP 330  Vs obesity and salt intake Another 1.7 L diuresis wiith iv lasix and aldactone Consider changing to demedex 40 mg po bid in am   2. HLD:  Continue statin   3. Substance Abuse:  Not clear that he has abstained history of cocaine use active smoker   4. Obesity: inpatient dietician consult   5. Polycythemia:elevated Hct with chronic CO2 retention arterial PO2 82.  Not clear if this is elevated from chronic respiratory failure, poorly Rx OSA, or P vera consider checking RBC mass   Jenkins Rouge 08/09/2019, 12:02 PM

## 2019-08-10 ENCOUNTER — Ambulatory Visit: Payer: Medicaid Other | Admitting: Family

## 2019-08-10 LAB — BLOOD GAS, ARTERIAL
Acid-Base Excess: 16.4 mmol/L — ABNORMAL HIGH (ref 0.0–2.0)
Bicarbonate: 45.6 mmol/L — ABNORMAL HIGH (ref 20.0–28.0)
Drawn by: 449561
FIO2: 100
O2 Saturation: 83.4 %
Patient temperature: 37
pCO2 arterial: 77 mmHg (ref 32.0–48.0)
pH, Arterial: 7.38 (ref 7.350–7.450)
pO2, Arterial: 49 mmHg — ABNORMAL LOW (ref 83.0–108.0)

## 2019-08-10 LAB — CBC WITH DIFFERENTIAL/PLATELET
Abs Immature Granulocytes: 0.01 10*3/uL (ref 0.00–0.07)
Basophils Absolute: 0 10*3/uL (ref 0.0–0.1)
Basophils Relative: 1 %
Eosinophils Absolute: 0.1 10*3/uL (ref 0.0–0.5)
Eosinophils Relative: 2 %
HCT: 55.5 % — ABNORMAL HIGH (ref 39.0–52.0)
Hemoglobin: 14.8 g/dL (ref 13.0–17.0)
Immature Granulocytes: 0 %
Lymphocytes Relative: 19 %
Lymphs Abs: 1.1 10*3/uL (ref 0.7–4.0)
MCH: 21.2 pg — ABNORMAL LOW (ref 26.0–34.0)
MCHC: 26.7 g/dL — ABNORMAL LOW (ref 30.0–36.0)
MCV: 79.4 fL — ABNORMAL LOW (ref 80.0–100.0)
Monocytes Absolute: 0.8 10*3/uL (ref 0.1–1.0)
Monocytes Relative: 14 %
Neutro Abs: 3.8 10*3/uL (ref 1.7–7.7)
Neutrophils Relative %: 64 %
Platelets: 99 10*3/uL — ABNORMAL LOW (ref 150–400)
RBC: 6.99 MIL/uL — ABNORMAL HIGH (ref 4.22–5.81)
RDW: 22.1 % — ABNORMAL HIGH (ref 11.5–15.5)
WBC: 5.9 10*3/uL (ref 4.0–10.5)
nRBC: 0 % (ref 0.0–0.2)

## 2019-08-10 LAB — BASIC METABOLIC PANEL
Anion gap: 9 (ref 5–15)
BUN: 41 mg/dL — ABNORMAL HIGH (ref 8–23)
CO2: 34 mmol/L — ABNORMAL HIGH (ref 22–32)
Calcium: 8.5 mg/dL — ABNORMAL LOW (ref 8.9–10.3)
Chloride: 92 mmol/L — ABNORMAL LOW (ref 98–111)
Creatinine, Ser: 1.09 mg/dL (ref 0.61–1.24)
GFR calc Af Amer: >60 mL/min (ref >60)
GFR calc non Af Amer: >60 mL/min (ref >60)
Glucose, Bld: 90 mg/dL (ref 70–99)
Potassium: 4.2 mmol/L (ref 3.5–5.1)
Sodium: 135 mmol/L (ref 135–145)

## 2019-08-10 MED ORDER — TORSEMIDE 20 MG PO TABS: 40.0000 mg | ORAL_TABLET | Freq: Two times a day (BID) | ORAL | Status: DC

## 2019-08-10 MED ORDER — TORSEMIDE 20 MG PO TABS: 40.0000 mg | ORAL_TABLET | Freq: Two times a day (BID) | ORAL | 0 refills | Status: DC

## 2019-08-10 NOTE — TOC Progression Note (Signed)
Transition of Care [facility] - Progression Note    Patient Details  Name: Gary [name] MRN: [id number] Date of Birth: [birth year]  Transition of Care [facility]) CM/SW Contact  Calirose Mccance, Gardiner Rhyme, LCSW Phone Number: 08/10/2019, 9:04 AM  Clinical Narrative:  Met briefly with pt to discuss follow up therapies, since this is recommended. He feels he can do what he needs to do at home. He recently finished therapies at home before being re-admitted here. He has home O2 and his daughter is involved and supportive. He feels he has no needs at discharge. He is waiting on Cardiology to see so he can be cleared to go home. No further follow due to possible DC today and no needs.         Expected Discharge Plan and Services                                                 Social Determinants of Health (SDOH) Interventions    Readmission Risk Interventions No flowsheet data found.
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
## 2019-08-10 NOTE — Progress Notes (Signed)
Progress Note  Patient Name: Gary [name] Date of Encounter: 08/10/2019  Primary Cardiologist: Ida Rogue, MD   Subjective   He feels significantly better.  Less shortness of breath and leg edema.  He feels close to baseline.  Inpatient Medications    Scheduled Meds: . aspirin EC  81 mg Oral Daily  . bisacodyl  10 mg Oral Daily  . Chlorhexidine Gluconate Cloth  6 each Topical Daily  . docusate sodium  200 mg Oral BID  . enoxaparin (LOVENOX) injection  40 mg Subcutaneous Q12H  . Ensure Max Protein  11 oz Oral BID  . simvastatin  10 mg Oral Daily  . sodium chloride flush  3 mL Intravenous Q12H  . spironolactone  25 mg Oral Daily  . torsemide  40 mg Oral BID   Continuous Infusions: . sodium chloride     PRN Meds: sodium chloride, acetaminophen, ALPRAZolam, ondansetron (ZOFRAN) IV, sodium chloride flush, sodium chloride flush, traMADol, zolpidem   Vital Signs    Vitals:   08/09/19 2000 08/10/19 0005 08/10/19 0538 08/10/19 0825  BP: 110/74 113/80  117/81  Pulse: 73 75  77  Resp: 16 18  18   Temp: 97.6 F (36.4 C) 98.4 F (36.9 C)    TempSrc: Oral Oral    SpO2: 99% 94%  93%  Weight:   16 kg   Height:        Intake/Output Summary (Last 24 hours) at 08/10/2019 1401 Last data filed at 08/10/2019 1007 Gross per 24 hour  Intake 483 ml  Output 1625 ml  Net -1142 ml   Last 3 Weights 08/10/2019 08/09/2019 08/08/2019  Weight (lbs) 35 lb 4.4 oz 339 lb 15.2 oz 347 lb 11.2 oz  Weight (kg) 16 kg 154.2 kg 157.716 kg      Telemetry    Sinus rhythm with PVCs- Personally Reviewed  ECG     - Personally Reviewed  Physical Exam   GEN: No acute distress.   Neck:  Jugular venous pressure is not well visualized Cardiac: RRR, no murmurs, rubs, or gallops.  Respiratory:  Diminished breath sound at the base GI: Soft, nontender, non-distended  MS:  Mild bilateral leg edema; No deformity. Neuro:  Nonfocal  Psych: Normal affect   Labs    High Sensitivity Troponin:     Recent Labs  Lab 08/04/19 1710 08/04/19 2120  TROPONINIHS 7 8      Chemistry Recent Labs  Lab 08/08/19 0306 08/09/19 0347 08/10/19 0540  NA 137 138 135  K 4.4 4.5 4.2  CL 91* 91* 92*  CO2 35* 38* 34*  GLUCOSE 80 79 90  BUN 31* 36* 41*  CREATININE 1.12 1.17 1.09  CALCIUM 8.5* 8.8* 8.5*  GFRNONAA >60 >60 >60  GFRAA >60 >60 >60  ANIONGAP 11 9 9      Hematology Recent Labs  Lab 08/08/19 0306 08/09/19 0347 08/10/19 0540  WBC 6.8 5.7 5.9  RBC 6.77* 6.97* 6.99*  HGB 14.3 14.9 14.8  HCT 53.3* 56.9* 55.5*  MCV 78.7* 81.6 79.4*  MCH 21.1* 21.4* 21.2*  MCHC 26.8* 26.2* 26.7*  RDW 22.2* 22.2* 22.1*  PLT 102* 103* 99*    BNP Recent Labs  Lab 08/04/19 1412  BNP 990.0*     DDimer No results for input(s): DDIMER in the last 168 hours.   Radiology    No results found.  Cardiac Studies   Echocardiogram in February 2021 showed normal LV systolic function.  Diastolic function could not be evaluated.  Patient Profile     64 y.o. male with chronic diastolic heart failure, polysubstance abuse, obesity and hypertension who presented with volume overload.  Assessment & Plan    1.  Acute on chronic diastolic heart failure: The patient diuresed very well with intravenous furosemide and he is -16 L for the admission.  Weight measurement does not seem to be accurate.  Clinically, he appears to be significantly improved.  Recommend switching furosemide to torsemide 40 mg twice daily.  He is on spironolactone 25 mg twice daily.  The patient can be discharged home from our standpoint.  He has a follow-up appointment with the heart failure clinic tomorrow.  2.  Essential hypertension: Blood pressure is controlled.  I discussed with Dr. Jimmye Norman.   For questions or updates, please contact [facility] Please consult [email or web address] for contact info under        Signed, Kathlyn Sacramento, MD  08/10/2019, 2:01 PM

## 2019-08-10 NOTE — Progress Notes (Signed)
D: Pt alert and oriented x 4. Pt reports experiencing bilat shoulder pain, this morning. PRN medication given, pt had little relief.   A: Pt received discharge and medication education/information. Pt belongings were gathered and taken with pt upon discharge to include home medication from pharmacy, and wallet brought to pt by security.  R: Pt verbalized understanding of discharge and medication education/information.  Pt escorted to pov by staff via wheelchair.

## 2019-08-11 ENCOUNTER — Other Ambulatory Visit: Payer: Self-pay

## 2019-08-11 ENCOUNTER — Encounter: Payer: Self-pay | Admitting: Family

## 2019-08-11 ENCOUNTER — Ambulatory Visit: Payer: Medicaid Other | Attending: Family | Admitting: Family

## 2019-08-11 VITALS — BP 102/76 | HR 83 | Ht 75.0 in | Wt 327.0 lb

## 2019-08-11 DIAGNOSIS — I110 Hypertensive heart disease with heart failure: Secondary | ICD-10-CM | POA: Diagnosis not present

## 2019-08-11 DIAGNOSIS — F17210 Nicotine dependence, cigarettes, uncomplicated: Secondary | ICD-10-CM | POA: Diagnosis not present

## 2019-08-11 DIAGNOSIS — Z8249 Family history of ischemic heart disease and other diseases of the circulatory system: Secondary | ICD-10-CM | POA: Diagnosis not present

## 2019-08-11 DIAGNOSIS — G4733 Obstructive sleep apnea (adult) (pediatric): Secondary | ICD-10-CM | POA: Insufficient documentation

## 2019-08-11 DIAGNOSIS — I10 Essential (primary) hypertension: Secondary | ICD-10-CM

## 2019-08-11 DIAGNOSIS — I5032 Chronic diastolic (congestive) heart failure: Secondary | ICD-10-CM | POA: Diagnosis present

## 2019-08-11 DIAGNOSIS — Z720 Tobacco use: Secondary | ICD-10-CM

## 2019-08-11 DIAGNOSIS — Z7982 Long term (current) use of aspirin: Secondary | ICD-10-CM | POA: Diagnosis not present

## 2019-08-11 DIAGNOSIS — Z79899 Other long term (current) drug therapy: Secondary | ICD-10-CM | POA: Diagnosis not present

## 2019-08-11 NOTE — Patient Instructions (Addendum)
Begin weighing daily and call for an overnight weight gain of > 2 pounds or a weekly weight gain of >5 pounds.   Call Dr. Laurelyn Sickle office to get an appointment scheduled.

## 2019-08-25 DIAGNOSIS — G9341 Metabolic encephalopathy: Secondary | ICD-10-CM | POA: Insufficient documentation

## 2019-08-25 DIAGNOSIS — F1910 Other psychoactive substance abuse, uncomplicated: Secondary | ICD-10-CM | POA: Insufficient documentation

## 2019-08-25 DIAGNOSIS — G928 Other toxic encephalopathy: Secondary | ICD-10-CM | POA: Insufficient documentation

## 2019-08-25 HISTORY — DX: Metabolic encephalopathy: G93.41
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
## 2019-11-11 ENCOUNTER — Ambulatory Visit: Payer: Medicaid Other | Attending: Family | Admitting: Family

## 2019-11-11 ENCOUNTER — Encounter: Payer: Self-pay | Admitting: Family

## 2019-11-11 ENCOUNTER — Other Ambulatory Visit: Payer: Self-pay

## 2019-11-11 VITALS — BP 147/85 | HR 79 | Resp 18 | Ht 75.0 in | Wt 371.0 lb

## 2019-11-11 DIAGNOSIS — G4733 Obstructive sleep apnea (adult) (pediatric): Secondary | ICD-10-CM | POA: Diagnosis not present

## 2019-11-11 DIAGNOSIS — I5032 Chronic diastolic (congestive) heart failure: Secondary | ICD-10-CM | POA: Diagnosis present

## 2019-11-11 DIAGNOSIS — F17210 Nicotine dependence, cigarettes, uncomplicated: Secondary | ICD-10-CM | POA: Diagnosis not present

## 2019-11-11 DIAGNOSIS — I10 Essential (primary) hypertension: Secondary | ICD-10-CM

## 2019-11-11 DIAGNOSIS — Z8719 Personal history of other diseases of the digestive system: Secondary | ICD-10-CM | POA: Insufficient documentation

## 2019-11-11 DIAGNOSIS — Z720 Tobacco use: Secondary | ICD-10-CM

## 2019-11-11 DIAGNOSIS — Z7982 Long term (current) use of aspirin: Secondary | ICD-10-CM | POA: Insufficient documentation

## 2019-11-11 DIAGNOSIS — Z79899 Other long term (current) drug therapy: Secondary | ICD-10-CM | POA: Insufficient documentation

## 2019-11-11 DIAGNOSIS — I110 Hypertensive heart disease with heart failure: Secondary | ICD-10-CM | POA: Diagnosis not present

## 2019-11-11 NOTE — Patient Instructions (Addendum)
Continue weighing daily and call for an overnight weight gain of > 2 pounds or a weekly weight gain of >5 pounds.  [facility] [address] [place], [place] [address] [phone] Dr. Khan(Cardiologist)  Raynelle Dick 11/26/19 at 10am. Please be there at 930am for paperwork.

## 2020-01-12 ENCOUNTER — Ambulatory Visit: Payer: Medicaid Other | Admitting: Family

## 2020-01-17 DIAGNOSIS — M7732 Calcaneal spur, left foot: Secondary | ICD-10-CM | POA: Insufficient documentation

## 2020-01-17 DIAGNOSIS — M160 Bilateral primary osteoarthritis of hip: Secondary | ICD-10-CM | POA: Insufficient documentation

## 2020-01-17 DIAGNOSIS — M4316 Spondylolisthesis, lumbar region: Secondary | ICD-10-CM | POA: Insufficient documentation

## 2020-01-17 DIAGNOSIS — M222X2 Patellofemoral disorders, left knee: Secondary | ICD-10-CM | POA: Insufficient documentation

## 2020-01-17 DIAGNOSIS — E878 Other disorders of electrolyte and fluid balance, not elsewhere classified: Secondary | ICD-10-CM | POA: Insufficient documentation

## 2020-01-17 DIAGNOSIS — F1290 Cannabis use, unspecified, uncomplicated: Secondary | ICD-10-CM | POA: Insufficient documentation

## 2020-01-17 DIAGNOSIS — E662 Morbid (severe) obesity with alveolar hypoventilation: Secondary | ICD-10-CM | POA: Insufficient documentation

## 2020-01-17 DIAGNOSIS — M47816 Spondylosis without myelopathy or radiculopathy, lumbar region: Secondary | ICD-10-CM | POA: Insufficient documentation

## 2020-01-17 DIAGNOSIS — R768 Other specified abnormal immunological findings in serum: Secondary | ICD-10-CM | POA: Insufficient documentation

## 2020-01-17 DIAGNOSIS — R718 Other abnormality of red blood cells: Secondary | ICD-10-CM | POA: Insufficient documentation

## 2020-01-17 DIAGNOSIS — M7731 Calcaneal spur, right foot: Secondary | ICD-10-CM | POA: Insufficient documentation

## 2020-01-17 DIAGNOSIS — M19141 Post-traumatic osteoarthritis, right hand: Secondary | ICD-10-CM | POA: Insufficient documentation

## 2020-01-17 DIAGNOSIS — M19012 Primary osteoarthritis, left shoulder: Secondary | ICD-10-CM | POA: Insufficient documentation

## 2020-01-17 DIAGNOSIS — M170 Bilateral primary osteoarthritis of knee: Secondary | ICD-10-CM | POA: Insufficient documentation

## 2020-01-17 DIAGNOSIS — M25471 Effusion, right ankle: Secondary | ICD-10-CM | POA: Insufficient documentation

## 2020-01-17 DIAGNOSIS — R700 Elevated erythrocyte sedimentation rate: Secondary | ICD-10-CM | POA: Insufficient documentation

## 2020-01-17 DIAGNOSIS — M25472 Effusion, left ankle: Secondary | ICD-10-CM | POA: Insufficient documentation

## 2020-01-17 DIAGNOSIS — E559 Vitamin D deficiency, unspecified: Secondary | ICD-10-CM | POA: Insufficient documentation

## 2020-01-17 DIAGNOSIS — R7989 Other specified abnormal findings of blood chemistry: Secondary | ICD-10-CM | POA: Insufficient documentation

## 2020-01-17 DIAGNOSIS — D696 Thrombocytopenia, unspecified: Secondary | ICD-10-CM | POA: Insufficient documentation

## 2020-01-17 DIAGNOSIS — R799 Abnormal finding of blood chemistry, unspecified: Secondary | ICD-10-CM | POA: Insufficient documentation

## 2020-01-17 DIAGNOSIS — M19011 Primary osteoarthritis, right shoulder: Secondary | ICD-10-CM | POA: Insufficient documentation

## 2020-01-17 DIAGNOSIS — M222X1 Patellofemoral disorders, right knee: Secondary | ICD-10-CM | POA: Insufficient documentation
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
## 2020-01-18 ENCOUNTER — Ambulatory Visit (HOSPITAL_BASED_OUTPATIENT_CLINIC_OR_DEPARTMENT_OTHER): Payer: Medicaid Other | Admitting: Pain Medicine

## 2020-01-18 DIAGNOSIS — G894 Chronic pain syndrome: Secondary | ICD-10-CM

## 2020-01-19 ENCOUNTER — Encounter: Payer: Self-pay | Admitting: Family

## 2020-01-19 ENCOUNTER — Ambulatory Visit: Payer: Medicaid Other | Attending: Family | Admitting: Family

## 2020-01-19 ENCOUNTER — Other Ambulatory Visit: Payer: Self-pay

## 2020-01-19 VITALS — BP 129/82 | HR 89 | Resp 20 | Ht 75.0 in | Wt 376.2 lb

## 2020-01-19 DIAGNOSIS — I10 Essential (primary) hypertension: Secondary | ICD-10-CM

## 2020-01-19 DIAGNOSIS — Z823 Family history of stroke: Secondary | ICD-10-CM | POA: Insufficient documentation

## 2020-01-19 DIAGNOSIS — Z9981 Dependence on supplemental oxygen: Secondary | ICD-10-CM | POA: Insufficient documentation

## 2020-01-19 DIAGNOSIS — Z79899 Other long term (current) drug therapy: Secondary | ICD-10-CM | POA: Insufficient documentation

## 2020-01-19 DIAGNOSIS — G4733 Obstructive sleep apnea (adult) (pediatric): Secondary | ICD-10-CM | POA: Diagnosis not present

## 2020-01-19 DIAGNOSIS — I5032 Chronic diastolic (congestive) heart failure: Secondary | ICD-10-CM | POA: Diagnosis present

## 2020-01-19 DIAGNOSIS — F17210 Nicotine dependence, cigarettes, uncomplicated: Secondary | ICD-10-CM | POA: Diagnosis not present

## 2020-01-19 DIAGNOSIS — I110 Hypertensive heart disease with heart failure: Secondary | ICD-10-CM | POA: Diagnosis not present

## 2020-01-19 DIAGNOSIS — R0902 Hypoxemia: Secondary | ICD-10-CM

## 2020-01-19 DIAGNOSIS — Z7982 Long term (current) use of aspirin: Secondary | ICD-10-CM | POA: Insufficient documentation

## 2020-01-19 DIAGNOSIS — Z720 Tobacco use: Secondary | ICD-10-CM

## 2020-01-19 NOTE — Patient Instructions (Addendum)
[facility] (Dr. Humphrey Rolls- Cardiology) [address], [place], [place] [address] [phone] *Please Call to Schedule appointment*   You MUST wear your oxygen all the time until you see the lung doctor on Thursday. Do NOT smoke while wearing the oxygen.
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
## 2020-01-25 NOTE — Progress Notes (Signed)
No encounter on this date. "NO SHOW" or "Cancelled" appointment.  

## 2020-02-09 ENCOUNTER — Ambulatory Visit: Payer: Medicaid Other | Admitting: Family
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
## 2020-02-11 ENCOUNTER — Encounter: Payer: Self-pay | Admitting: Family

## 2020-02-11 ENCOUNTER — Other Ambulatory Visit: Payer: Self-pay

## 2020-02-11 ENCOUNTER — Ambulatory Visit: Payer: Medicaid Other | Attending: Family | Admitting: Family

## 2020-02-11 ENCOUNTER — Telehealth: Payer: Self-pay | Admitting: Family

## 2020-02-11 VITALS — BP 131/76 | HR 86 | Resp 20 | Ht 75.0 in | Wt 386.0 lb

## 2020-02-11 DIAGNOSIS — I5032 Chronic diastolic (congestive) heart failure: Secondary | ICD-10-CM | POA: Diagnosis not present

## 2020-02-11 DIAGNOSIS — I110 Hypertensive heart disease with heart failure: Secondary | ICD-10-CM | POA: Diagnosis not present

## 2020-02-11 DIAGNOSIS — F17210 Nicotine dependence, cigarettes, uncomplicated: Secondary | ICD-10-CM | POA: Insufficient documentation

## 2020-02-11 DIAGNOSIS — G4733 Obstructive sleep apnea (adult) (pediatric): Secondary | ICD-10-CM | POA: Diagnosis not present

## 2020-02-11 DIAGNOSIS — Z720 Tobacco use: Secondary | ICD-10-CM

## 2020-02-11 DIAGNOSIS — I10 Essential (primary) hypertension: Secondary | ICD-10-CM

## 2020-02-11 LAB — BASIC METABOLIC PANEL
Anion gap: 7 (ref 5–15)
BUN: 27 mg/dL — ABNORMAL HIGH (ref 8–23)
CO2: 31 mmol/L (ref 22–32)
Calcium: 8.4 mg/dL — ABNORMAL LOW (ref 8.9–10.3)
Chloride: 100 mmol/L (ref 98–111)
Creatinine, Ser: 0.99 mg/dL (ref 0.61–1.24)
GFR, Estimated: >60 mL/min (ref >60)
Glucose, Bld: 98 mg/dL (ref 70–99)
Potassium: 4.9 mmol/L (ref 3.5–5.1)
Sodium: 138 mmol/L (ref 135–145)

## 2020-02-11 NOTE — Patient Instructions (Addendum)
Continue weighing daily and call for an overnight weight gain of > 2 pounds or a weekly weight gain of >5 pounds.   [facility] [address], [place], [place] [address] [phone]  Thurs Nov. 18th 10am

## 2020-02-11 NOTE — Telephone Encounter (Signed)
LM on patient's voicemail (per his request) that his lab work looked good and that he needed to resume his carvedilol 6.25mg  BID. Any questions, he is to call us back.

## 2020-02-19 ENCOUNTER — Other Ambulatory Visit: Payer: Self-pay | Admitting: Family Medicine

## 2020-02-25 ENCOUNTER — Emergency Department: Payer: Medicaid Other

## 2020-02-25 ENCOUNTER — Inpatient Hospital Stay
Admission: EM | Admit: 2020-02-25 | Discharge: 2020-03-05 | DRG: 208 | Disposition: A | Discharge status: Home Health | Payer: Medicaid Other | Attending: Internal Medicine | Admitting: Internal Medicine

## 2020-02-25 ENCOUNTER — Other Ambulatory Visit: Payer: Self-pay

## 2020-02-25 ENCOUNTER — Encounter: Payer: Self-pay | Admitting: Emergency Medicine

## 2020-02-25 DIAGNOSIS — J9811 Atelectasis: Secondary | ICD-10-CM | POA: Diagnosis present

## 2020-02-25 DIAGNOSIS — Z823 Family history of stroke: Secondary | ICD-10-CM | POA: Diagnosis not present

## 2020-02-25 DIAGNOSIS — K254 Chronic or unspecified gastric ulcer with hemorrhage: Secondary | ICD-10-CM | POA: Diagnosis present

## 2020-02-25 DIAGNOSIS — E875 Hyperkalemia: Secondary | ICD-10-CM | POA: Diagnosis present

## 2020-02-25 DIAGNOSIS — D126 Benign neoplasm of colon, unspecified: Secondary | ICD-10-CM | POA: Diagnosis present

## 2020-02-25 DIAGNOSIS — Z6841 Body Mass Index (BMI) 40.0 and over, adult: Secondary | ICD-10-CM | POA: Diagnosis present

## 2020-02-25 DIAGNOSIS — G894 Chronic pain syndrome: Secondary | ICD-10-CM | POA: Diagnosis present

## 2020-02-25 DIAGNOSIS — G928 Other toxic encephalopathy: Secondary | ICD-10-CM | POA: Diagnosis present

## 2020-02-25 DIAGNOSIS — E662 Morbid (severe) obesity with alveolar hypoventilation: Secondary | ICD-10-CM | POA: Diagnosis present

## 2020-02-25 DIAGNOSIS — E872 Acidosis, unspecified: Secondary | ICD-10-CM

## 2020-02-25 DIAGNOSIS — Z9049 Acquired absence of other specified parts of digestive tract: Secondary | ICD-10-CM

## 2020-02-25 DIAGNOSIS — Z9119 Patient's noncompliance with other medical treatment and regimen: Secondary | ICD-10-CM

## 2020-02-25 DIAGNOSIS — G4733 Obstructive sleep apnea (adult) (pediatric): Secondary | ICD-10-CM | POA: Diagnosis present

## 2020-02-25 DIAGNOSIS — I428 Other cardiomyopathies: Secondary | ICD-10-CM | POA: Diagnosis present

## 2020-02-25 DIAGNOSIS — E66813 Morbid (severe) obesity with alveolar hypoventilation: Secondary | ICD-10-CM

## 2020-02-25 DIAGNOSIS — J441 Chronic obstructive pulmonary disease with (acute) exacerbation: Principal | ICD-10-CM

## 2020-02-25 DIAGNOSIS — D696 Thrombocytopenia, unspecified: Secondary | ICD-10-CM | POA: Diagnosis present

## 2020-02-25 DIAGNOSIS — J811 Chronic pulmonary edema: Secondary | ICD-10-CM

## 2020-02-25 DIAGNOSIS — J9621 Acute and chronic respiratory failure with hypoxia: Secondary | ICD-10-CM | POA: Diagnosis present

## 2020-02-25 DIAGNOSIS — Z20822 Contact with and (suspected) exposure to covid-19: Secondary | ICD-10-CM | POA: Diagnosis present

## 2020-02-25 DIAGNOSIS — J449 Chronic obstructive pulmonary disease, unspecified: Secondary | ICD-10-CM

## 2020-02-25 DIAGNOSIS — Z978 Presence of other specified devices: Secondary | ICD-10-CM

## 2020-02-25 DIAGNOSIS — I509 Heart failure, unspecified: Secondary | ICD-10-CM | POA: Diagnosis not present

## 2020-02-25 DIAGNOSIS — I110 Hypertensive heart disease with heart failure: Secondary | ICD-10-CM | POA: Diagnosis present

## 2020-02-25 DIAGNOSIS — N179 Acute kidney failure, unspecified: Secondary | ICD-10-CM

## 2020-02-25 DIAGNOSIS — K5730 Diverticulosis of large intestine without perforation or abscess without bleeding: Secondary | ICD-10-CM | POA: Diagnosis present

## 2020-02-25 DIAGNOSIS — I10 Essential (primary) hypertension: Secondary | ICD-10-CM | POA: Diagnosis present

## 2020-02-25 DIAGNOSIS — Z8711 Personal history of peptic ulcer disease: Secondary | ICD-10-CM | POA: Diagnosis not present

## 2020-02-25 DIAGNOSIS — J9622 Acute and chronic respiratory failure with hypercapnia: Secondary | ICD-10-CM | POA: Diagnosis not present

## 2020-02-25 DIAGNOSIS — J9602 Acute respiratory failure with hypercapnia: Secondary | ICD-10-CM

## 2020-02-25 DIAGNOSIS — I5033 Acute on chronic diastolic (congestive) heart failure: Secondary | ICD-10-CM | POA: Diagnosis not present

## 2020-02-25 DIAGNOSIS — Z0189 Encounter for other specified special examinations: Secondary | ICD-10-CM

## 2020-02-25 DIAGNOSIS — R0902 Hypoxemia: Secondary | ICD-10-CM

## 2020-02-25 DIAGNOSIS — R031 Nonspecific low blood-pressure reading: Secondary | ICD-10-CM | POA: Diagnosis present

## 2020-02-25 DIAGNOSIS — Z833 Family history of diabetes mellitus: Secondary | ICD-10-CM

## 2020-02-25 DIAGNOSIS — R7989 Other specified abnormal findings of blood chemistry: Secondary | ICD-10-CM | POA: Diagnosis not present

## 2020-02-25 DIAGNOSIS — G9341 Metabolic encephalopathy: Secondary | ICD-10-CM | POA: Diagnosis not present

## 2020-02-25 DIAGNOSIS — G4730 Sleep apnea, unspecified: Secondary | ICD-10-CM | POA: Diagnosis present

## 2020-02-25 DIAGNOSIS — F17210 Nicotine dependence, cigarettes, uncomplicated: Secondary | ICD-10-CM | POA: Diagnosis present

## 2020-02-25 DIAGNOSIS — D751 Secondary polycythemia: Secondary | ICD-10-CM | POA: Diagnosis present

## 2020-02-25 DIAGNOSIS — K621 Rectal polyp: Secondary | ICD-10-CM | POA: Diagnosis present

## 2020-02-25 DIAGNOSIS — J9601 Acute respiratory failure with hypoxia: Secondary | ICD-10-CM | POA: Diagnosis not present

## 2020-02-25 DIAGNOSIS — R0602 Shortness of breath: Secondary | ICD-10-CM | POA: Diagnosis present

## 2020-02-25 DIAGNOSIS — I959 Hypotension, unspecified: Secondary | ICD-10-CM | POA: Diagnosis present

## 2020-02-25 HISTORY — DX: Acute respiratory failure with hypercapnia: J96.02

## 2020-02-25 LAB — URINALYSIS, COMPLETE (UACMP) WITH MICROSCOPIC
Bilirubin Urine: NEGATIVE
Glucose, UA: NEGATIVE mg/dL
Hgb urine dipstick: NEGATIVE
Ketones, ur: NEGATIVE mg/dL
Leukocytes,Ua: NEGATIVE
Nitrite: NEGATIVE
Protein, ur: 100 mg/dL — AB
Specific Gravity, Urine: 1.013 (ref 1.005–1.030)
pH: 5 (ref 5.0–8.0)

## 2020-02-25 LAB — CBC WITH DIFFERENTIAL/PLATELET
Abs Immature Granulocytes: 0.07 10*3/uL (ref 0.00–0.07)
Basophils Absolute: 0 10*3/uL (ref 0.0–0.1)
Basophils Relative: 0 %
Eosinophils Absolute: 0 10*3/uL (ref 0.0–0.5)
Eosinophils Relative: 0 %
HCT: 58.6 % — ABNORMAL HIGH (ref 39.0–52.0)
Hemoglobin: 14.8 g/dL (ref 13.0–17.0)
Immature Granulocytes: 1 %
Lymphocytes Relative: 13 %
Lymphs Abs: 1.2 10*3/uL (ref 0.7–4.0)
MCH: 21.2 pg — ABNORMAL LOW (ref 26.0–34.0)
MCHC: 25.3 g/dL — ABNORMAL LOW (ref 30.0–36.0)
MCV: 84.1 fL (ref 80.0–100.0)
Monocytes Absolute: 1.3 10*3/uL — ABNORMAL HIGH (ref 0.1–1.0)
Monocytes Relative: 13 %
Neutro Abs: 6.9 10*3/uL (ref 1.7–7.7)
Neutrophils Relative %: 73 %
Platelets: 115 10*3/uL — ABNORMAL LOW (ref 150–400)
RBC: 6.97 MIL/uL — ABNORMAL HIGH (ref 4.22–5.81)
RDW: 23.8 % — ABNORMAL HIGH (ref 11.5–15.5)
Smear Review: NORMAL
WBC: 9.5 10*3/uL (ref 4.0–10.5)
nRBC: 6.2 % — ABNORMAL HIGH (ref 0.0–0.2)

## 2020-02-25 LAB — GLUCOSE, CAPILLARY
Glucose-Capillary: 90 mg/dL (ref 70–99)
Glucose-Capillary: 89 mg/dL (ref 70–99)
Glucose-Capillary: 102 mg/dL — ABNORMAL HIGH (ref 70–99)

## 2020-02-25 LAB — COMPREHENSIVE METABOLIC PANEL
ALT: 15 U/L (ref 0–44)
AST: 17 U/L (ref 15–41)
Albumin: 3.3 g/dL — ABNORMAL LOW (ref 3.5–5.0)
Alkaline Phosphatase: 87 U/L (ref 38–126)
Anion gap: 4 — ABNORMAL LOW (ref 5–15)
BUN: 43 mg/dL — ABNORMAL HIGH (ref 8–23)
CO2: 31 mmol/L (ref 22–32)
Calcium: 7.9 mg/dL — ABNORMAL LOW (ref 8.9–10.3)
Chloride: 101 mmol/L (ref 98–111)
Creatinine, Ser: 1.57 mg/dL — ABNORMAL HIGH (ref 0.61–1.24)
GFR, Estimated: 50 mL/min — ABNORMAL LOW (ref >60)
Glucose, Bld: 104 mg/dL — ABNORMAL HIGH (ref 70–99)
Potassium: 5.4 mmol/L — ABNORMAL HIGH (ref 3.5–5.1)
Sodium: 136 mmol/L (ref 135–145)
Total Bilirubin: 0.8 mg/dL (ref 0.3–1.2)
Total Protein: 7.7 g/dL (ref 6.5–8.1)

## 2020-02-25 LAB — BLOOD GAS, ARTERIAL
Acid-Base Excess: 3.2 mmol/L — ABNORMAL HIGH (ref 0.0–2.0)
Acid-Base Excess: 3.6 mmol/L — ABNORMAL HIGH (ref 0.0–2.0)
Acid-Base Excess: 7.8 mmol/L — ABNORMAL HIGH (ref 0.0–2.0)
Bicarbonate: 33.9 mmol/L — ABNORMAL HIGH (ref 20.0–28.0)
Bicarbonate: 34.7 mmol/L — ABNORMAL HIGH (ref 20.0–28.0)
Bicarbonate: 35.8 mmol/L — ABNORMAL HIGH (ref 20.0–28.0)
Expiratory PAP: 10
FIO2: 0.36
FIO2: 0.6
FIO2: 0.9
Inspiratory PAP: 18
MECHVT: 600 mL
O2 Saturation: 93.3 %
O2 Saturation: 95.3 %
O2 Saturation: 98.1 %
PEEP: 8 cmH2O
Patient temperature: 37
Patient temperature: 37
Patient temperature: 37
RATE: 15 resp/min
pCO2 arterial: 56 mmHg — ABNORMAL HIGH (ref 32.0–48.0)
pCO2 arterial: 81 mmHg (ref 32.0–48.0)
pCO2 arterial: 96 mmHg (ref 32.0–48.0)
pH, Arterial: 7.18 — CL (ref 7.350–7.450)
pH, Arterial: 7.23 — ABNORMAL LOW (ref 7.350–7.450)
pH, Arterial: 7.4 (ref 7.350–7.450)
pO2, Arterial: 129 mmHg — ABNORMAL HIGH (ref 83.0–108.0)
pO2, Arterial: 68 mmHg — ABNORMAL LOW (ref 83.0–108.0)
pO2, Arterial: 91 mmHg (ref 83.0–108.0)

## 2020-02-25 LAB — LACTIC ACID, PLASMA
Lactic Acid, Venous: 1.4 mmol/L (ref 0.5–1.9)
Lactic Acid, Venous: 0.9 mmol/L (ref 0.5–1.9)

## 2020-02-25 LAB — PROCALCITONIN: Procalcitonin: 0.39 ng/mL

## 2020-02-25 LAB — BRAIN NATRIURETIC PEPTIDE: B Natriuretic Peptide: 1332.8 pg/mL — ABNORMAL HIGH (ref 0.0–100.0)

## 2020-02-25 LAB — TROPONIN I (HIGH SENSITIVITY)
Troponin I (High Sensitivity): 14 ng/L (ref <18)
Troponin I (High Sensitivity): 16 ng/L (ref <18)

## 2020-02-25 LAB — CORTISOL: Cortisol, Plasma: 29.7 ug/dL

## 2020-02-25 LAB — RESPIRATORY PANEL BY RT PCR (FLU A&B, COVID)
Influenza A by PCR: NEGATIVE
Influenza B by PCR: NEGATIVE
SARS Coronavirus 2 by RT PCR: NEGATIVE

## 2020-02-25 LAB — MRSA PCR SCREENING: MRSA by PCR: NEGATIVE

## 2020-02-25 LAB — TRIGLYCERIDES: Triglycerides: 93 mg/dL (ref <150)

## 2020-02-25 MED ORDER — PANTOPRAZOLE SODIUM 40 MG IV SOLR
40.0000 mg | Freq: Once | INTRAVENOUS | Status: AC
  Administered 2020-02-25: 40 mg via INTRAVENOUS
  Filled 2020-02-25: qty 40

## 2020-02-25 MED ORDER — CHLORHEXIDINE GLUCONATE CLOTH 2 % EX PADS
6.0000 | MEDICATED_PAD | Freq: Every day | CUTANEOUS | Status: DC
  Administered 2020-02-27: 6 via TOPICAL

## 2020-02-25 MED ORDER — ORAL CARE MOUTH RINSE
15.0000 mL | OROMUCOSAL | Status: DC
  Administered 2020-02-25 – 2020-02-28 (×27): 15 mL via OROMUCOSAL

## 2020-02-25 MED ORDER — METHYLPREDNISOLONE SODIUM SUCC 40 MG IJ SOLR
40.0000 mg | INTRAMUSCULAR | Status: DC
  Administered 2020-02-25 – 2020-02-29 (×5): 40 mg via INTRAVENOUS
  Filled 2020-02-25 (×5): qty 1

## 2020-02-25 MED ORDER — SODIUM CHLORIDE 0.9 % IV SOLN
0.5000 mg/kg/h | INTRAVENOUS | Status: DC
  Administered 2020-02-25 (×2): 0.5 mg/kg/h via INTRAVENOUS
  Filled 2020-02-25 (×4): qty 50

## 2020-02-25 MED ORDER — SODIUM CHLORIDE 0.9 % IV SOLN
1.0000 mg/kg/h | INTRAVENOUS | Status: DC
  Filled 2020-02-25 (×6): qty 5

## 2020-02-25 MED ORDER — CHLORHEXIDINE GLUCONATE 0.12% ORAL RINSE (MEDLINE KIT)
15.0000 mL | Freq: Two times a day (BID) | OROMUCOSAL | Status: DC
  Administered 2020-02-25 – 2020-03-05 (×11): 15 mL via OROMUCOSAL

## 2020-02-25 MED ORDER — DOCUSATE SODIUM 100 MG PO CAPS: 100.0000 mg | ORAL_CAPSULE | Freq: Two times a day (BID) | ORAL | Status: DC | PRN

## 2020-02-25 MED ORDER — ASPIRIN 81 MG PO CHEW
324.0000 mg | CHEWABLE_TABLET | ORAL | Status: AC
  Administered 2020-02-25: 324 mg via ORAL
  Filled 2020-02-25: qty 4

## 2020-02-25 MED ORDER — IPRATROPIUM-ALBUTEROL 0.5-2.5 (3) MG/3ML IN SOLN
3.0000 mL | Freq: Once | RESPIRATORY_TRACT | Status: AC
  Administered 2020-02-25: 3 mL via RESPIRATORY_TRACT

## 2020-02-25 MED ORDER — SODIUM CHLORIDE 0.9 % IV SOLN
1.0000 mg/kg/h | INTRAVENOUS | Status: DC
  Administered 2020-02-25: 1 mg/kg/h via INTRAVENOUS
  Filled 2020-02-25 (×6): qty 50

## 2020-02-25 MED ORDER — DOCUSATE SODIUM 50 MG/5ML PO LIQD
100.0000 mg | Freq: Two times a day (BID) | ORAL | Status: DC | PRN
  Administered 2020-03-03: 100 mg
  Filled 2020-02-25 (×2): qty 10

## 2020-02-25 MED ORDER — ENOXAPARIN SODIUM 40 MG/0.4ML ~~LOC~~ SOLN: 40.0000 mg | SUBCUTANEOUS | Status: DC

## 2020-02-25 MED ORDER — ASPIRIN 300 MG RE SUPP: 300.0000 mg | RECTAL | Status: AC

## 2020-02-25 MED ORDER — AZITHROMYCIN 500 MG PO TABS
500.0000 mg | ORAL_TABLET | Freq: Every day | ORAL | Status: DC
  Administered 2020-02-25: 500 mg via ORAL
  Filled 2020-02-25 (×2): qty 1

## 2020-02-25 MED ORDER — IPRATROPIUM-ALBUTEROL 0.5-2.5 (3) MG/3ML IN SOLN
3.0000 mL | Freq: Once | RESPIRATORY_TRACT | Status: AC
  Administered 2020-02-25: 3 mL via RESPIRATORY_TRACT
  Filled 2020-02-25: qty 3

## 2020-02-25 MED ORDER — POLYETHYLENE GLYCOL 3350 17 G PO PACK: 17.0000 g | PACK | Freq: Every day | ORAL | Status: DC | PRN

## 2020-02-25 MED ORDER — PROPOFOL 1000 MG/100ML IV EMUL
5.0000 ug/kg/min | INTRAVENOUS | Status: DC
  Administered 2020-02-25 (×3): 20 ug/kg/min via INTRAVENOUS
  Administered 2020-02-26 – 2020-02-27 (×13): 40 ug/kg/min via INTRAVENOUS
  Administered 2020-02-27: 35 ug/kg/min via INTRAVENOUS
  Administered 2020-02-27 (×2): 30 ug/kg/min via INTRAVENOUS
  Administered 2020-02-27: 35 ug/kg/min via INTRAVENOUS
  Administered 2020-02-27: 30 ug/kg/min via INTRAVENOUS
  Administered 2020-02-28 (×5): 35 ug/kg/min via INTRAVENOUS
  Filled 2020-02-25 (×18): qty 100
  Filled 2020-02-25: qty 200
  Filled 2020-02-25 (×7): qty 100

## 2020-02-25 MED ORDER — KETAMINE HCL 10 MG/ML IJ SOLN
INTRAMUSCULAR | Status: AC
  Filled 2020-02-25: qty 1

## 2020-02-25 MED ORDER — KETAMINE HCL 10 MG/ML IJ SOLN
1.0000 mg/kg | INTRAMUSCULAR | Status: DC
  Filled 2020-02-25: qty 1

## 2020-02-25 MED ORDER — FENTANYL 2500MCG IN NS 250ML (10MCG/ML) PREMIX INFUSION
0.0000 ug/h | INTRAVENOUS | Status: DC
  Administered 2020-02-25 (×2): 400 ug/h via INTRAVENOUS
  Administered 2020-02-26: 250 ug/h via INTRAVENOUS
  Administered 2020-02-26: 400 ug/h via INTRAVENOUS
  Administered 2020-02-26: 325 ug/h via INTRAVENOUS
  Administered 2020-02-26: 200 ug/h via INTRAVENOUS
  Administered 2020-02-27: 300 ug/h via INTRAVENOUS
  Administered 2020-02-27 – 2020-02-28 (×2): 225 ug/h via INTRAVENOUS
  Filled 2020-02-25 (×9): qty 250

## 2020-02-25 MED ORDER — FUROSEMIDE 10 MG/ML IJ SOLN
40.0000 mg | Freq: Once | INTRAMUSCULAR | Status: AC
  Administered 2020-02-25: 40 mg via INTRAVENOUS
  Filled 2020-02-25: qty 4

## 2020-02-25 MED ORDER — IPRATROPIUM-ALBUTEROL 0.5-2.5 (3) MG/3ML IN SOLN
RESPIRATORY_TRACT | Status: AC
  Filled 2020-02-25: qty 3

## 2020-02-25 NOTE — ED Notes (Signed)
X-Ray at bedside.

## 2020-02-25 NOTE — ED Notes (Signed)
50 mg ketamine given now by Levada Dy RN for sedation per verbal order

## 2020-02-25 NOTE — ED Triage Notes (Signed)
Pt arrived via EMS. Per EMS, pt c/o weakness starting 2 days ago.   Per EMS, pt O2 was in 60s when they arrived, but after putting pt on 4L, pt was at 94%.   Per EMS pt has hx of CHF, COPD and smoking history.   Per EMS pt took home meds 1 hour ago.   Pt A&O x 4 and walked to stretcher with 2 person assist. Pt had increased dyspnea on exertion

## 2020-02-25 NOTE — ED Notes (Addendum)
Pt is alert and following commands at this time. Pt denies any pain at this time.
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
## 2020-02-25 NOTE — Plan of Care (Signed)
Pt admitted to ICU 7, extremely agitated, reaching for ETT w/both hands, sedation added per Dr Lanney Gins, sister Peggy at bedside, helpful w/admission.  VSS on MV, FIO2 60%

## 2020-02-25 NOTE — ED Notes (Addendum)
200 mg Ketamine and 100 Rocc drawn up by [employer] per verbal order from [facility]

## 2020-02-25 NOTE — ED Notes (Signed)
50mg  Ketamine IVP given now by Levada Dy RN per verbal order from [facility]

## 2020-02-25 NOTE — ED Notes (Signed)
RT called regarding repeat ABG at this time

## 2020-02-25 NOTE — ED Notes (Signed)
Pt continues to be suctioned periodically. Bright red blood noted in suction canister at this time

## 2020-02-25 NOTE — ED Notes (Signed)
Pt transported to ICU 7 at this time

## 2020-02-25 NOTE — ED Notes (Signed)
RT at bedside.

## 2020-02-25 NOTE — ED Notes (Addendum)
Pads on pt at this time. 15L via [place] placed on pt per verbal order from [facility]

## 2020-02-25 NOTE — ED Notes (Signed)
EDP Melinda completed intubation at this time

## 2020-02-25 NOTE — ED Notes (Signed)
200 mg Ketamine given by Sam RN now

## 2020-02-25 NOTE — ED Notes (Signed)
EDP Melinda attempting intubation at this time

## 2020-02-25 NOTE — ED Notes (Signed)
Pt given urinal for collection when able 
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
## 2020-02-25 NOTE — ED Notes (Signed)
Attempted to call for pt's room assignment. Per Secretary the RN is at lunch. This RN gave [place] number and will wait for return call.

## 2020-02-25 NOTE — ED Notes (Signed)
RT called to check tube at this time

## 2020-02-25 NOTE — ED Notes (Signed)
Verbal order from [place] for 50mg  ketamine IVP now

## 2020-02-25 NOTE — ED Notes (Signed)
Pt able to be redirected and nods to understand instructions and what is going on. MD Malinda aware

## 2020-02-25 NOTE — ED Notes (Signed)
RT called regarding new pt orders for Bipap and ABG

## 2020-02-25 NOTE — ED Notes (Signed)
100 Rocc given by Sam RN

## 2020-02-25 NOTE — ED Notes (Signed)
Pt placed on Bipap by RT at this time and ABG completed by RT at this time

## 2020-02-25 NOTE — ED Notes (Signed)
Pharmacy called for sedation meds for the second time. Per pharmacy tech, there was an issue and they are making it now and will send ASAP

## 2020-02-25 NOTE — ED Notes (Signed)
Respiratory called at this time

## 2020-02-25 NOTE — ED Notes (Signed)
Pharmacy called to send ketamine STAT

## 2020-02-25 NOTE — ED Notes (Addendum)
EDP Rip Harbour, Sam RN, Ariel RN , this RN, and RT at bedside to prepare for intubation. Ambu bag and suction ready at bedside at this time

## 2020-02-25 NOTE — ED Notes (Signed)
Ventilator put in place by RT at this time

## 2020-02-25 NOTE — ED Notes (Signed)
X Ray at bedside at this time.  

## 2020-02-26 ENCOUNTER — Inpatient Hospital Stay: Payer: Medicaid Other

## 2020-02-26 LAB — CBC
HCT: 55.1 % — ABNORMAL HIGH (ref 39.0–52.0)
Hemoglobin: 14.8 g/dL (ref 13.0–17.0)
MCH: 21.3 pg — ABNORMAL LOW (ref 26.0–34.0)
MCHC: 26.9 g/dL — ABNORMAL LOW (ref 30.0–36.0)
MCV: 79.2 fL — ABNORMAL LOW (ref 80.0–100.0)
Platelets: 98 10*3/uL — ABNORMAL LOW (ref 150–400)
RBC: 6.96 MIL/uL — ABNORMAL HIGH (ref 4.22–5.81)
RDW: 23.8 % — ABNORMAL HIGH (ref 11.5–15.5)
WBC: 8.4 10*3/uL (ref 4.0–10.5)
nRBC: 2.1 % — ABNORMAL HIGH (ref 0.0–0.2)

## 2020-02-26 LAB — BASIC METABOLIC PANEL
Anion gap: 9 (ref 5–15)
BUN: 41 mg/dL — ABNORMAL HIGH (ref 8–23)
CO2: 30 mmol/L (ref 22–32)
Calcium: 8.2 mg/dL — ABNORMAL LOW (ref 8.9–10.3)
Chloride: 101 mmol/L (ref 98–111)
Creatinine, Ser: 1.43 mg/dL — ABNORMAL HIGH (ref 0.61–1.24)
GFR, Estimated: 55 mL/min — ABNORMAL LOW (ref >60)
Glucose, Bld: 73 mg/dL (ref 70–99)
Potassium: 4.6 mmol/L (ref 3.5–5.1)
Sodium: 140 mmol/L (ref 135–145)

## 2020-02-26 LAB — GLUCOSE, CAPILLARY
Glucose-Capillary: 86 mg/dL (ref 70–99)
Glucose-Capillary: 81 mg/dL (ref 70–99)
Glucose-Capillary: 93 mg/dL (ref 70–99)
Glucose-Capillary: 99 mg/dL (ref 70–99)
Glucose-Capillary: 121 mg/dL — ABNORMAL HIGH (ref 70–99)
Glucose-Capillary: 96 mg/dL (ref 70–99)

## 2020-02-26 LAB — MAGNESIUM: Magnesium: 2.7 mg/dL — ABNORMAL HIGH (ref 1.7–2.4)

## 2020-02-26 LAB — PHOSPHORUS: Phosphorus: 2.4 mg/dL — ABNORMAL LOW (ref 2.5–4.6)

## 2020-02-26 LAB — PROCALCITONIN: Procalcitonin: 0.28 ng/mL

## 2020-02-26 MED ORDER — AZITHROMYCIN 500 MG PO TABS
500.0000 mg | ORAL_TABLET | Freq: Every day | ORAL | Status: DC
  Administered 2020-02-26: 500 mg
  Filled 2020-02-26: qty 1

## 2020-02-26 MED ORDER — FUROSEMIDE 10 MG/ML IJ SOLN
40.0000 mg | Freq: Two times a day (BID) | INTRAMUSCULAR | Status: DC
  Administered 2020-02-26 – 2020-02-28 (×6): 40 mg via INTRAVENOUS
  Filled 2020-02-26 (×6): qty 4

## 2020-02-26 MED ORDER — GLYCOPYRROLATE 0.2 MG/ML IJ SOLN
0.1000 mg | Freq: Once | INTRAMUSCULAR | Status: AC
  Administered 2020-02-26: 0.1 mg via INTRAVENOUS
  Filled 2020-02-26: qty 1

## 2020-02-26 MED ORDER — VITAL AF 1.2 CAL PO LIQD
1000.0000 mL | ORAL | Status: DC
  Administered 2020-02-26 – 2020-02-27 (×2): 1000 mL

## 2020-02-26 MED ORDER — FENTANYL CITRATE (PF) 100 MCG/2ML IJ SOLN
INTRAMUSCULAR | Status: AC
  Filled 2020-02-26: qty 2

## 2020-02-26 MED ORDER — PROSOURCE TF PO LIQD
90.0000 mL | Freq: Every day | ORAL | Status: DC
  Administered 2020-02-26 – 2020-02-28 (×11): 90 mL
  Filled 2020-02-26 (×15): qty 90

## 2020-02-26 MED ORDER — SODIUM CHLORIDE 0.9 % IV SOLN
500.0000 mg | INTRAVENOUS | Status: DC
  Administered 2020-02-27: 500 mg via INTRAVENOUS
  Filled 2020-02-26 (×2): qty 500

## 2020-02-26 MED ORDER — FUROSEMIDE 10 MG/ML IJ SOLN: 40.0000 mg | Freq: Two times a day (BID) | INTRAMUSCULAR | Status: DC

## 2020-02-26 MED ORDER — MIDAZOLAM HCL 2 MG/2ML IJ SOLN
INTRAMUSCULAR | Status: AC
  Filled 2020-02-26: qty 2

## 2020-02-26 MED ORDER — VITAL HIGH PROTEIN PO LIQD: 1000.0000 mL | ORAL | Status: DC

## 2020-02-26 NOTE — Progress Notes (Signed)
Initial Nutrition Assessment  DOCUMENTATION CODES:   Morbid obesity  INTERVENTION:  Initiate Vital AF 1.2 Cal at 20 mL/hr (480 mL goal daily volume) + PROSource TF 90 mL 6 times daily per tube. Provides 1056 kcal, 168 grams of protein, 389 mL H2O daily. With current propofol rate provides 2165 kcal daily.  Provide MVI daily per tube.  NUTRITION DIAGNOSIS:   Inadequate oral intake related to inability to eat as evidenced by NPO status.  GOAL:   Patient will meet greater than or equal to 90% of their needs  MONITOR:   Vent status, Weight trends, Labs, TF tolerance, I & O's  REASON FOR ASSESSMENT:   Ventilator, Consult Enteral/tube feeding initiation and management  ASSESSMENT:   64 year old male with PMHx of HTN, CHF, sleep apnea admitted with acute exacerbation of COPD, acute decompensated diastolic CHF, renal failure.   11/11 intubated  Patient is currently intubated on ventilator support MV: 8.7 L/min Temp (24hrs), Avg:98.7 F (37.1 C), Min:98.2 F (36.8 C), Max:99 F (37.2 C)  Propofol: 42 ml/hr (1109 kcal daily)  Medications reviewed and include: Lasix 40 mg BID IV, Solu-Medrol 40 mg daily IV, azithromycin, fentanyl gtt, propofol gtt.  Labs reviewed: CBG 81-93, BUN 41, Creatinine 1.43, Phosphorus 2.4, Magnesium 2.7.  I/O: 3125 mL UOP yesterday  Enteral Access: OGT placed 11/11; terminates in proximal stomach per abdominal x-ray 11/11  Discussed with RN and on rounds. Per MD can initiate trickle tube feeds today. Unable to meet protein needs fully while on trickle rate. Per ASPEN guidelines patient can receive up to 223 grams of protein daily, but RD will start at current protein provision and monitor.  NUTRITION - FOCUSED PHYSICAL EXAM:    Most Recent Value  Orbital Region No depletion  Upper Arm Region No depletion  Thoracic and Lumbar Region No depletion  Buccal Region Unable to assess  Temple Region No depletion  Clavicle Bone Region No depletion   Clavicle and Acromion Bone Region No depletion  Scapular Bone Region Unable to assess  Dorsal Hand No depletion  Patellar Region No depletion  Anterior Thigh Region No depletion  Posterior Calf Region No depletion  Edema (RD Assessment) Mild  Hair Reviewed  Eyes Unable to assess  Mouth Unable to assess  Skin Reviewed  Nails Reviewed     Diet Order:   Diet Order            Diet NPO time specified  Diet effective now                EDUCATION NEEDS:   No education needs have been identified at this time  Skin:  Skin Assessment: Reviewed RN Assessment  Last BM:  Unknown/PTA  Height:   Ht Readings from Last 1 Encounters:  02/25/20 6\' 3"  (1.905 m)   Weight:   Wt Readings from Last 1 Encounters:  02/26/20 (!) 171.3 kg   Ideal Body Weight:  89.1 kg  BMI:  Body mass index is 47.2 kg/m.  Estimated Nutritional Needs:   Kcal:  7353-2992  Protein:  178-188 grams  Fluid:  2 L/day  Jacklynn Barnacle, MS, RD, LDN Pager number available on Amion

## 2020-02-26 NOTE — Progress Notes (Signed)
Noticed that the patient had drainage coming from his mouth resembling tube feeding. Tube feeds held and the OG tube was removed and replaced per verbal orders from Linton Hall, NP. KUB ordered for new tube - awaiting results. Will continue to monitor.   Cameron Ali, RN

## 2020-02-26 NOTE — Progress Notes (Signed)
New OG tube is in place and verified in the stomach by x-ray. Tube feeds restarted.  Cameron Ali, RN
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
## 2020-02-27 LAB — GLUCOSE, CAPILLARY
Glucose-Capillary: 94 mg/dL (ref 70–99)
Glucose-Capillary: 98 mg/dL (ref 70–99)
Glucose-Capillary: 129 mg/dL — ABNORMAL HIGH (ref 70–99)
Glucose-Capillary: 103 mg/dL — ABNORMAL HIGH (ref 70–99)

## 2020-02-27 LAB — BLOOD GAS, ARTERIAL
Acid-Base Excess: 10.3 mmol/L — ABNORMAL HIGH (ref 0.0–2.0)
Bicarbonate: 36.3 mmol/L — ABNORMAL HIGH (ref 20.0–28.0)
FIO2: 0.35
MECHVT: 600 mL
O2 Saturation: 91.6 %
PEEP: 8 cmH2O
Patient temperature: 37
RATE: 15 resp/min
pCO2 arterial: 51 mmHg — ABNORMAL HIGH (ref 32.0–48.0)
pH, Arterial: 7.46 — ABNORMAL HIGH (ref 7.350–7.450)
pO2, Arterial: 59 mmHg — ABNORMAL LOW (ref 83.0–108.0)

## 2020-02-27 LAB — CBC
HCT: 56.1 % — ABNORMAL HIGH (ref 39.0–52.0)
Hemoglobin: 15.4 g/dL (ref 13.0–17.0)
MCH: 21.3 pg — ABNORMAL LOW (ref 26.0–34.0)
MCHC: 27.5 g/dL — ABNORMAL LOW (ref 30.0–36.0)
MCV: 77.5 fL — ABNORMAL LOW (ref 80.0–100.0)
Platelets: 101 10*3/uL — ABNORMAL LOW (ref 150–400)
RBC: 7.24 MIL/uL — ABNORMAL HIGH (ref 4.22–5.81)
RDW: 23.6 % — ABNORMAL HIGH (ref 11.5–15.5)
WBC: 6.8 10*3/uL (ref 4.0–10.5)
nRBC: 0.4 % — ABNORMAL HIGH (ref 0.0–0.2)

## 2020-02-27 LAB — PROCALCITONIN: Procalcitonin: 0.1 ng/mL

## 2020-02-27 LAB — FIBRIN DERIVATIVES D-DIMER (ARMC ONLY): Fibrin derivatives D-dimer (ARMC): 5085.35 ng/mL (FEU) — ABNORMAL HIGH (ref 0.00–499.00)

## 2020-02-27 LAB — BASIC METABOLIC PANEL
Anion gap: 9 (ref 5–15)
BUN: 45 mg/dL — ABNORMAL HIGH (ref 8–23)
CO2: 31 mmol/L (ref 22–32)
Calcium: 8.6 mg/dL — ABNORMAL LOW (ref 8.9–10.3)
Chloride: 100 mmol/L (ref 98–111)
Creatinine, Ser: 1.26 mg/dL — ABNORMAL HIGH (ref 0.61–1.24)
GFR, Estimated: >60 mL/min (ref >60)
Glucose, Bld: 84 mg/dL (ref 70–99)
Potassium: 4 mmol/L (ref 3.5–5.1)
Sodium: 140 mmol/L (ref 135–145)
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
## 2020-02-28 LAB — BASIC METABOLIC PANEL
Anion gap: 7 (ref 5–15)
BUN: 47 mg/dL — ABNORMAL HIGH (ref 8–23)
CO2: 30 mmol/L (ref 22–32)
Calcium: 8.3 mg/dL — ABNORMAL LOW (ref 8.9–10.3)
Chloride: 99 mmol/L (ref 98–111)
Creatinine, Ser: 1.14 mg/dL (ref 0.61–1.24)
GFR, Estimated: >60 mL/min (ref >60)
Glucose, Bld: 92 mg/dL (ref 70–99)
Potassium: 3.7 mmol/L (ref 3.5–5.1)
Sodium: 136 mmol/L (ref 135–145)

## 2020-02-28 LAB — GLUCOSE, CAPILLARY
Glucose-Capillary: 107 mg/dL — ABNORMAL HIGH (ref 70–99)
Glucose-Capillary: 100 mg/dL — ABNORMAL HIGH (ref 70–99)
Glucose-Capillary: 103 mg/dL — ABNORMAL HIGH (ref 70–99)
Glucose-Capillary: 116 mg/dL — ABNORMAL HIGH (ref 70–99)
Glucose-Capillary: 121 mg/dL — ABNORMAL HIGH (ref 70–99)

## 2020-02-28 LAB — CBC
HCT: 57.8 % — ABNORMAL HIGH (ref 39.0–52.0)
Hemoglobin: 16.1 g/dL (ref 13.0–17.0)
MCH: 21.3 pg — ABNORMAL LOW (ref 26.0–34.0)
MCHC: 27.9 g/dL — ABNORMAL LOW (ref 30.0–36.0)
MCV: 76.4 fL — ABNORMAL LOW (ref 80.0–100.0)
Platelets: 98 10*3/uL — ABNORMAL LOW (ref 150–400)
RBC: 7.57 MIL/uL — ABNORMAL HIGH (ref 4.22–5.81)
RDW: 23.7 % — ABNORMAL HIGH (ref 11.5–15.5)
WBC: 6.8 10*3/uL (ref 4.0–10.5)
nRBC: 0 % (ref 0.0–0.2)

## 2020-02-28 LAB — PHOSPHORUS: Phosphorus: 4.6 mg/dL (ref 2.5–4.6)

## 2020-02-28 LAB — MAGNESIUM: Magnesium: 2.4 mg/dL (ref 1.7–2.4)

## 2020-02-28 LAB — POTASSIUM: Potassium: 4.1 mmol/L (ref 3.5–5.1)

## 2020-02-28 LAB — TRIGLYCERIDES: Triglycerides: 217 mg/dL — ABNORMAL HIGH (ref <150)

## 2020-02-28 MED ORDER — FUROSEMIDE 10 MG/ML IJ SOLN: 40.0000 mg | Freq: Every day | INTRAMUSCULAR | Status: DC

## 2020-02-28 MED ORDER — DOXYCYCLINE CALCIUM 50 MG/5ML PO SYRP
100.0000 mg | ORAL_SOLUTION | Freq: Two times a day (BID) | ORAL | Status: DC
  Filled 2020-02-28 (×2): qty 10

## 2020-02-28 MED ORDER — DOXYCYCLINE CALCIUM 50 MG/5ML PO SYRP
100.0000 mg | ORAL_SOLUTION | Freq: Two times a day (BID) | ORAL | Status: DC
  Administered 2020-02-28 – 2020-02-29 (×3): 100 mg
  Filled 2020-02-28 (×5): qty 10

## 2020-02-28 MED ORDER — GLYCOPYRROLATE 0.2 MG/ML IJ SOLN
0.1000 mg | Freq: Once | INTRAMUSCULAR | Status: AC
  Administered 2020-02-28: 0.1 mg via INTRAVENOUS
  Filled 2020-02-28: qty 1

## 2020-02-28 MED ORDER — CHLORHEXIDINE GLUCONATE CLOTH 2 % EX PADS
6.0000 | MEDICATED_PAD | Freq: Every day | CUTANEOUS | Status: DC
  Administered 2020-02-28 – 2020-03-03 (×4): 6 via TOPICAL

## 2020-02-28 NOTE — Progress Notes (Signed)
Patient extubated per MD order, strong productive cough. Placed on 6L nasal cannula
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
## 2020-02-28 NOTE — Progress Notes (Addendum)
0900: RN notified by central monitoring that pt's QTc is 507. Dr. Lanney Gins notified and he ordered to change the Azithromycin to Doxycycline. Orders placed in Epic.  1100: Sedation held for Spontaneous breathing trial.   1115: Patient is awake, alert, following commands, a little bit restless and frustrated. He is trying to mouth words and cant be understood.   1155: Dr. Lanney Gins at bedside ordered to extubate the patient.

## 2020-02-29 ENCOUNTER — Inpatient Hospital Stay: Payer: Medicaid Other

## 2020-02-29 DIAGNOSIS — G4733 Obstructive sleep apnea (adult) (pediatric): Secondary | ICD-10-CM

## 2020-02-29 DIAGNOSIS — G9341 Metabolic encephalopathy: Secondary | ICD-10-CM

## 2020-02-29 DIAGNOSIS — J9601 Acute respiratory failure with hypoxia: Secondary | ICD-10-CM

## 2020-02-29 DIAGNOSIS — J9602 Acute respiratory failure with hypercapnia: Secondary | ICD-10-CM

## 2020-02-29 DIAGNOSIS — I5033 Acute on chronic diastolic (congestive) heart failure: Secondary | ICD-10-CM

## 2020-02-29 LAB — CBC
HCT: 59.6 % — ABNORMAL HIGH (ref 39.0–52.0)
Hemoglobin: 16 g/dL (ref 13.0–17.0)
MCH: 21.1 pg — ABNORMAL LOW (ref 26.0–34.0)
MCHC: 26.8 g/dL — ABNORMAL LOW (ref 30.0–36.0)
MCV: 78.7 fL — ABNORMAL LOW (ref 80.0–100.0)
Platelets: 93 10*3/uL — ABNORMAL LOW (ref 150–400)
RBC: 7.57 MIL/uL — ABNORMAL HIGH (ref 4.22–5.81)
RDW: 23.4 % — ABNORMAL HIGH (ref 11.5–15.5)
WBC: 8.8 10*3/uL (ref 4.0–10.5)
nRBC: 0.5 % — ABNORMAL HIGH (ref 0.0–0.2)

## 2020-02-29 LAB — BASIC METABOLIC PANEL
Anion gap: 11 (ref 5–15)
BUN: 46 mg/dL — ABNORMAL HIGH (ref 8–23)
CO2: 30 mmol/L (ref 22–32)
Calcium: 8.4 mg/dL — ABNORMAL LOW (ref 8.9–10.3)
Chloride: 100 mmol/L (ref 98–111)
Creatinine, Ser: 1.07 mg/dL (ref 0.61–1.24)
GFR, Estimated: >60 mL/min (ref >60)
Glucose, Bld: 81 mg/dL (ref 70–99)
Potassium: 4 mmol/L (ref 3.5–5.1)
Sodium: 141 mmol/L (ref 135–145)

## 2020-02-29 LAB — CULTURE, RESPIRATORY W GRAM STAIN: Culture: NORMAL

## 2020-02-29 LAB — PHOSPHORUS: Phosphorus: 4.4 mg/dL (ref 2.5–4.6)

## 2020-02-29 LAB — MAGNESIUM: Magnesium: 2.5 mg/dL — ABNORMAL HIGH (ref 1.7–2.4)

## 2020-02-29 MED ORDER — UMECLIDINIUM-VILANTEROL 62.5-25 MCG/INH IN AEPB
1.0000 | INHALATION_SPRAY | Freq: Every day | RESPIRATORY_TRACT | Status: DC
  Administered 2020-02-29 – 2020-03-05 (×6): 1 via RESPIRATORY_TRACT
  Filled 2020-02-29: qty 14

## 2020-02-29 MED ORDER — SPIRONOLACTONE 25 MG PO TABS
25.0000 mg | ORAL_TABLET | Freq: Every day | ORAL | Status: DC
  Administered 2020-02-29: 25 mg via ORAL
  Filled 2020-02-29: qty 1

## 2020-02-29 MED ORDER — IPRATROPIUM-ALBUTEROL 20-100 MCG/ACT IN AERS
1.0000 | INHALATION_SPRAY | Freq: Four times a day (QID) | RESPIRATORY_TRACT | Status: DC | PRN
  Administered 2020-02-29 – 2020-03-02 (×3): 1 via RESPIRATORY_TRACT
  Filled 2020-02-29: qty 4

## 2020-02-29 MED ORDER — DOXYCYCLINE CALCIUM 50 MG/5ML PO SYRP
100.0000 mg | ORAL_SOLUTION | Freq: Two times a day (BID) | ORAL | Status: DC
  Filled 2020-02-29: qty 10

## 2020-02-29 MED ORDER — TORSEMIDE 20 MG PO TABS
40.0000 mg | ORAL_TABLET | Freq: Every day | ORAL | Status: DC
  Administered 2020-02-29: 40 mg via ORAL
  Filled 2020-02-29: qty 2

## 2020-02-29 MED ORDER — TRAZODONE HCL 50 MG PO TABS
50.0000 mg | ORAL_TABLET | Freq: Every evening | ORAL | Status: DC | PRN
  Administered 2020-02-29 – 2020-03-01 (×3): 50 mg via ORAL
  Filled 2020-02-29 (×3): qty 1

## 2020-02-29 NOTE — Progress Notes (Signed)
Pt A&OX4 and symmetrical. Denies pain. VSS on HFNC at 8 L. Lungs clear/ diminished. Report called to 2A. Will transfer via bed.

## 2020-02-29 NOTE — Plan of Care (Signed)

## 2020-02-29 NOTE — Evaluation (Signed)
Occupational Therapy Evaluation Patient Details Name: Gary [name] MRN: [id number] DOB: 1955/09/06 Today's Date: 02/29/2020    History of Present Illness Pt is 64 y/o M who was admitted for ARF with complaints of weakness x 2 days. History includes CHF and COPD (3L of O2 at night). Of note: pt required intubation from 11/12-11/14   Clinical Impression   Pt was seen for OT evaluation this date. Prior to hospital admission, pt was INDEP with all aspects of self care ADLs and mobility including working and driving. Pt lives with his significant other in an apartment with 1 STE. Currently pt demonstrates impairments as described below (See OT problem list) which functionally limit his ability to perform ADL/self-care tasks. Pt currently requires MIN/MOD A with ADL transfers, MOD A with LB ADLs in sitting and MIN A/CGA for standing/weight shift.  Pt would benefit from skilled OT services to address noted impairments and functional limitations (see below for any additional details) in order to maximize safety and independence while minimizing falls risk and caregiver burden. Upon hospital discharge, recommend STR to maximize pt safety and return to PLOF.     Follow Up Recommendations  SNF    Equipment Recommendations  3 in 1 bedside commode;Tub/shower seat;Other (comment) (2WW)    Recommendations for Other Services       Precautions / Restrictions Precautions Precautions: Fall Restrictions Weight Bearing Restrictions: No      Mobility Bed Mobility Overal bed mobility: Needs Assistance Bed Mobility: Supine to Sit     Supine to sit: Min guard     General bed mobility comments: HOB elevated, increased time, slight truncal assist    Transfers Overall transfer level: Needs assistance Equipment used: Rolling walker (2 wheeled) (bari RW) Transfers: Sit to/from Stand Sit to Stand: Min assist;From elevated surface         General transfer comment: MOD/MAX A from lower bed level,  demos increased success when elevated ~4-5", educated on importance of gradually decreasing bed's assistance in elevating to stand.    Balance Overall balance assessment: Needs assistance Sitting-balance support: Feet supported;Bilateral upper extremity supported Sitting balance-[name] Scale: Good     Standing balance support: Bilateral upper extremity supported Standing balance-[name] Scale: Fair                             ADL either performed or assessed with clinical judgement   ADL                                         General ADL Comments: SETUP/MOD I for seated UB ADLs, MOD A for seated LB ADLs. MIN A for ADL transfers with RW.     Vision Patient Visual Report: No change from baseline       Perception     Praxis      Pertinent Vitals/Pain Pain Assessment: No/denies pain     Hand Dominance     Extremity/Trunk Assessment Upper Extremity Assessment Upper Extremity Assessment: Overall WFL for tasks assessed;Generalized weakness (ROm WFL, MMT grossly 4/5)   Lower Extremity Assessment Lower Extremity Assessment: Defer to PT evaluation;Generalized weakness       Communication Communication Communication: No difficulties   Cognition Arousal/Alertness: Awake/alert Behavior During Therapy: WFL for tasks assessed/performed Overall Cognitive Status: Within Functional Limits for tasks assessed  General Comments       Exercises Exercises: Other exercises Other Exercises Other Exercises: OT educates pt re: importance of OOB activity, bed level UB therex he can be completing in bed during time outside of therapy for maintenance (demo and rationale provided, needs f/u), and role of OT. Pt with good reception. Other Exercises: OT engages pt in weight shifting and small marches (low foot clearance) to prep for fxl mobility with MIN A for balance with use of RW and MIN verbal cues for weight  shift technique with pt progressing to actual steps and reporting he feels more confident this afternoon versus with PT earlier this date.   Shoulder Instructions      Home Living Family/patient expects to be discharged to:: Private residence Living Arrangements: Spouse/significant other Available Help at Discharge: Family;Available PRN/intermittently;Personal care attendant Type of Home: Apartment Home Access: Stairs to enter Entrance Stairs-Number of Steps: 1 Entrance Stairs-Rails: None Home Layout: One level     Bathroom Shower/Tub: Teacher, early years/pre: Handicapped height     Home Equipment: Cane - quad;Cane - single point;Walker - 2 wheels          Prior Functioning/Environment Level of Independence: Needs assistance  Gait / Transfers Assistance Needed: walks with SPC, MOD I for fxl mobility. ADL's / Homemaking Assistance Needed: States that he has aide 80hrs/wk for bathing/dressing/cooking/cleaning. States wife is also able to perform household IADLs.   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        OT Problem List: Decreased strength;Decreased range of motion;Decreased activity tolerance;Impaired balance (sitting and/or standing);Decreased safety awareness;Decreased knowledge of use of DME or AE;Obesity      OT Treatment/Interventions: Self-care/ADL training;DME and/or AE instruction;Therapeutic activities;Balance training;Therapeutic exercise;Energy conservation;Patient/family education    OT Goals(Current goals can be found in the care plan section) Acute Rehab OT Goals Patient Stated Goal: to go home OT Goal Formulation: With patient Time For Goal Achievement: 03/14/20 Potential to Achieve Goals: Good ADL Goals Pt Will Perform Lower Body Dressing: with supervision;sit to/from stand (with AE PRN) Pt Will Transfer to Toilet: with supervision;bedside commode;ambulating (with LRAD) Pt Will Perform Toileting - Clothing  Manipulation and hygiene: with supervision;sit to/from stand Pt/caregiver will Perform Home Exercise Program: Increased strength;Both right and left upper extremity;With Supervision Additional ADL Goal #1: Pt will tolerate standing x6-7 mins while completing ADL task or theract, demonstrating good activity pacing with no verbal cues, to increase fxl activity tolerance  OT Frequency: Min 1X/week   Barriers to D/C:            Co-evaluation              AM-PAC OT "6 Clicks" Daily Activity     Outcome Measure Help from another person eating meals?: None Help from another person taking care of personal grooming?: A Little Help from another person toileting, which includes using toliet, bedpan, or urinal?: A Lot Help from another person bathing (including washing, rinsing, drying)?: A Lot Help from another person to put on and taking off regular upper body clothing?: A Little Help from another person to put on and taking off regular lower body clothing?: A Lot 6 Click Score: 16   End of Session Equipment Utilized During Treatment: Gait belt;Rolling walker Nurse Communication: Mobility status  Activity Tolerance: Patient tolerated treatment well Patient left: Other (comment) (seated EOB with all needs met and in reach, alarm reactivated, RN notified and okay'ed.)  OT Visit Diagnosis: Unsteadiness on  feet (R26.81);Muscle weakness (generalized) (M62.81)                Time: 3646-8032 OT Time Calculation (min): 56 min Charges:  OT General Charges $OT Visit: 1 Visit OT Evaluation $OT Eval Moderate Complexity: 1 Mod OT Treatments $Self Care/Home Management : 23-37 mins $Therapeutic Activity: 8-22 mins  Gerrianne Scale, MS, OTR/L ascom [phone] 02/29/20, 5:25 PM
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
## 2020-02-29 NOTE — Progress Notes (Signed)
Received patient from ICU-- patient oriented to room, no questions comments or concerns at this time. Will continue to monitor.

## 2020-02-29 NOTE — Evaluation (Signed)
Physical Therapy Evaluation Patient Details Name: Gary [name] MRN: [id number] DOB: [birth year] Today's Date: 02/29/2020   History of Present Illness  Pt admitted for ARF with complaints of weakness x 2 days. HIstory includes CHF and COPD (3L of O2 at night).  Clinical Impression  Pt is a pleasant 64 year old male who was admitted for ARF with weakness x 2 days. Pt performs bed mobility with min assist and transfers with mod assist from elevated surface. Not safe to attempt ambulation as he is unable to appropriately weight shift in standing. Decreased eccentric control on transition back to seated position. All mobility performed on 8L of HFNC with sats WNL. Pt demonstrates deficits with strength/mobility/endurance. Currently isn't at baseline level. Pt appears to be very motivated and prefers to dc home vs SNF. Would benefit from skilled PT to address above deficits and promote optimal return to PLOF; recommend transition to STR upon discharge from acute hospitalization.     Follow Up Recommendations SNF    Equipment Recommendations   (TBD)    Recommendations for Other Services       Precautions / Restrictions Precautions Precautions: Fall Restrictions Weight Bearing Restrictions: No      Mobility  Bed Mobility Overal bed mobility: Needs Assistance Bed Mobility: Supine to Sit     Supine to sit: Min assist     General bed mobility comments: needs slight assist for trunk/upper body. Once seated, able to maintain upright posture.    Transfers Overall transfer level: Needs assistance Equipment used: Rolling walker (2 wheeled) (BRW) Transfers: Sit to/from Stand Sit to Stand: Mod assist         General transfer comment: max assist for attempt from low surface, however unable to clear buttocks from bed. Able to stand from elvated bed. Wide BOS. Decreased eccentric control during decent back to bed  Ambulation/Gait             General Gait Details: Able to perform  pre gait activities including marching, but not safe to take steps at this time due to weakness. O2 sats in 90s on 8L of HFNC  Stairs            Wheelchair Mobility    Modified Rankin (Stroke Patients Only)       Balance Overall balance assessment: Needs assistance Sitting-balance support: Feet supported;Bilateral upper extremity supported Sitting balance-[name] Scale: Good     Standing balance support: Bilateral upper extremity supported Standing balance-[name] Scale: Fair                               Pertinent Vitals/Pain Pain Assessment: No/denies pain    Home Living Family/patient expects to be discharged to:: Private residence Living Arrangements: Spouse/significant other Available Help at Discharge: Family;Available PRN/intermittently;Personal care attendant Type of Home: Apartment Home Access: Stairs to enter Entrance Stairs-Rails: None (holds onto door frame) Entrance Stairs-Number of Steps: 1 Home Layout: One level Home Equipment: Cane - quad;Cane - single point;Walker - 2 wheels      Prior Function Level of Independence: Needs assistance   Gait / Transfers Assistance Needed: walks with SPC, MOD I for fxl mobility.     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     [place]        Extremity/Trunk Assessment   Upper Extremity Assessment Upper Extremity Assessment: Generalized weakness (B UE grossly 4/5)    Lower Extremity Assessment Lower Extremity  Assessment: Generalized weakness (B LE grossly 3+/5)       Communication   Communication: No difficulties  Cognition Arousal/Alertness: Awake/alert Behavior During Therapy: WFL for tasks assessed/performed Overall Cognitive Status: Within Functional Limits for tasks assessed                                        General Comments      Exercises Other Exercises Other Exercises: supine/seated ther-ex performed on B LE including AP,  quad sets, SLR, hip add squeezes, and LAQ. ALl ther-ex performed x 10 reps with cga Other Exercises: Pt able to stand for extended time while RN changed sheets on bed   Assessment/Plan    PT Assessment Patient needs continued PT services  PT Problem List Decreased strength;Decreased activity tolerance;Decreased balance;Decreased mobility;Obesity;Cardiopulmonary status limiting activity       PT Treatment Interventions Gait training;Therapeutic exercise;Therapeutic activities;Balance training;DME instruction    PT Goals (Current goals can be found in the Care Plan section)  Acute Rehab PT Goals Patient Stated Goal: to go home PT Goal Formulation: With patient Time For Goal Achievement: 03/14/20 Potential to Achieve Goals: Good    Frequency Min 2X/week   Barriers to discharge        Co-evaluation               AM-PAC PT "6 Clicks" Mobility  Outcome Measure Help needed turning from your back to your side while in a flat bed without using bedrails?: None Help needed moving from lying on your back to sitting on the side of a flat bed without using bedrails?: A Little Help needed moving to and from a bed to a chair (including a wheelchair)?: A Lot Help needed standing up from a chair using your arms (e.g., wheelchair or bedside chair)?: A Lot Help needed to walk in hospital room?: Total Help needed climbing 3-5 steps with a railing? : Total 6 Click Score: 13    End of Session Equipment Utilized During Treatment: Gait belt;Oxygen Activity Tolerance: Patient tolerated treatment well Patient left: in bed;with nursing/sitter in room Nurse Communication: Mobility status PT Visit Diagnosis: Muscle weakness (generalized) (M62.81);Difficulty in walking, not elsewhere classified (R26.2);Unsteadiness on feet (R26.81)    Time: 1224-8250 PT Time Calculation (min) (ACUTE ONLY): 28 min   Charges:   PT Evaluation $PT Eval Moderate Complexity: 1 Mod PT Treatments $Therapeutic  Exercise: 8-22 mins        Greggory Stallion, PT, DPT [phone]   Jaylyn Iyer 02/29/2020, 3:22 PM

## 2020-03-01 ENCOUNTER — Inpatient Hospital Stay: Payer: Medicaid Other

## 2020-03-01 ENCOUNTER — Encounter: Payer: Self-pay | Admitting: Student

## 2020-03-01 DIAGNOSIS — R7989 Other specified abnormal findings of blood chemistry: Secondary | ICD-10-CM

## 2020-03-01 LAB — CULTURE, BLOOD (ROUTINE X 2)
Culture: NO GROWTH
Culture: NO GROWTH
Special Requests: ADEQUATE
Special Requests: ADEQUATE

## 2020-03-01 LAB — CBC
HCT: 60.3 % — ABNORMAL HIGH (ref 39.0–52.0)
Hemoglobin: 15.7 g/dL (ref 13.0–17.0)
MCH: 20.9 pg — ABNORMAL LOW (ref 26.0–34.0)
MCHC: 26 g/dL — ABNORMAL LOW (ref 30.0–36.0)
MCV: 80.4 fL (ref 80.0–100.0)
Platelets: 92 10*3/uL — ABNORMAL LOW (ref 150–400)
RBC: 7.5 MIL/uL — ABNORMAL HIGH (ref 4.22–5.81)
RDW: 23.1 % — ABNORMAL HIGH (ref 11.5–15.5)
WBC: 6.7 10*3/uL (ref 4.0–10.5)
nRBC: 0 % (ref 0.0–0.2)

## 2020-03-01 LAB — RENAL FUNCTION PANEL
Albumin: 3.2 g/dL — ABNORMAL LOW (ref 3.5–5.0)
Anion gap: 11 (ref 5–15)
BUN: 47 mg/dL — ABNORMAL HIGH (ref 8–23)
CO2: 31 mmol/L (ref 22–32)
Calcium: 8.5 mg/dL — ABNORMAL LOW (ref 8.9–10.3)
Chloride: 96 mmol/L — ABNORMAL LOW (ref 98–111)
Creatinine, Ser: 1.28 mg/dL — ABNORMAL HIGH (ref 0.61–1.24)
GFR, Estimated: >60 mL/min (ref >60)
Glucose, Bld: 82 mg/dL (ref 70–99)
Phosphorus: 4.7 mg/dL — ABNORMAL HIGH (ref 2.5–4.6)
Potassium: 4.2 mmol/L (ref 3.5–5.1)
Sodium: 138 mmol/L (ref 135–145)

## 2020-03-01 LAB — MAGNESIUM: Magnesium: 2.5 mg/dL — ABNORMAL HIGH (ref 1.7–2.4)

## 2020-03-01 MED ORDER — DOCUSATE SODIUM 100 MG PO CAPS
200.0000 mg | ORAL_CAPSULE | Freq: Every day | ORAL | Status: DC
  Administered 2020-03-02 – 2020-03-05 (×4): 200 mg via ORAL
  Filled 2020-03-01 (×4): qty 2

## 2020-03-01 MED ORDER — POLYETHYLENE GLYCOL 3350 17 G PO PACK: 17.0000 g | PACK | Freq: Two times a day (BID) | ORAL | Status: DC | PRN

## 2020-03-01 MED ORDER — SENNA 8.6 MG PO TABS: 2.0000 | ORAL_TABLET | Freq: Every day | ORAL | Status: DC | PRN

## 2020-03-01 MED ORDER — ENSURE ENLIVE PO LIQD
237.0000 mL | Freq: Three times a day (TID) | ORAL | Status: DC
  Administered 2020-03-01 – 2020-03-05 (×10): 237 mL via ORAL

## 2020-03-01 MED ORDER — IOHEXOL 350 MG/ML SOLN
100.0000 mL | Freq: Once | INTRAVENOUS | Status: AC | PRN
  Administered 2020-03-01: 100 mL via INTRAVENOUS

## 2020-03-01 MED ORDER — BOOST / RESOURCE BREEZE PO LIQD CUSTOM: 1.0000 | Freq: Three times a day (TID) | ORAL | Status: DC

## 2020-03-01 MED ORDER — DOXYCYCLINE CALCIUM 50 MG/5ML PO SYRP
100.0000 mg | ORAL_SOLUTION | Freq: Two times a day (BID) | ORAL | Status: AC
  Administered 2020-03-01 (×2): 100 mg via ORAL
  Filled 2020-03-01: qty 10

## 2020-03-01 MED ORDER — ADULT MULTIVITAMIN W/MINERALS CH
1.0000 | ORAL_TABLET | Freq: Every day | ORAL | Status: DC
  Administered 2020-03-02 – 2020-03-05 (×4): 1 via ORAL
  Filled 2020-03-01 (×4): qty 1

## 2020-03-01 NOTE — TOC Initial Note (Signed)
Transition of Care [facility]) - Initial/Assessment Note    Patient Details  Name: Gary [name] MRN: [id number] Date of Birth: May 06, 1955  [facility]) CM/SW Contact:    Kerin Salen, RN Phone Number: 03/01/2020, 3:05 PM  Clinical Narrative:    Patient cooperative but anxious, lives with friend. 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 On oxygen 3l at night and receive home health nurse visits 3-4x week to assist with ADL's. Now with C-Pap at home, however arrived today, could not remember the service provider nor the [facility] provider. States Jane[facility] Riggers provides nurse services, called Janeece Riggers and was told patient services was discontinued in 2000, due to completed goals. I called [facility] to set up services, no answer left voice message to return call. Patient receives PCP at the [facility], Dr. Mikle Bosworth and also get medications from the clinic, able to drive to appointments and pick up meds. Do have friends to assist if needed.               Expected Discharge Plan: [place] Barriers to Discharge: Continued Medical Work up   Patient Goals and CMS Choice Patient states their goals for this hospitalization and ongoing recovery are:: Return home lives with friend.      Expected Discharge Plan and Services Expected Discharge Plan: [place] In-house Referral: Clinical Social Work   Post Acute Care Choice: Home Health, Durable Medical Equipment Living arrangements for the past 2 months: Apartment                 DME Arranged: NIV DME Agency: [facility] Date DME Agency Contacted: 03/01/20 Time DME Agency Contacted: 1502 Representative spoke with at DME Agency: Janett Billow HH Arranged: RN, PT, OT, Nurse's Aide, Refused SNF [facility] Agency: [facility] Date [facility] Agency Contacted: 03/01/20 Time [place]: 61 Representative spoke with at [place]: Janett Billow left message to return call [phone]  Prior Living Arrangements/Services Living  arrangements for the past 2 months: Apartment Lives with:: Friends Patient language and need for interpreter reviewed:: Yes Do you feel safe going back to the place where you live?: Yes      Need for Family Participation in Patient Care: No (Comment) Care giver support system in place?: No (comment) Current home services: DME, Homehealth aide Criminal Activity/Legal Involvement Pertinent to Current Situation/Hospitalization: No - Comment as needed  Activities of Daily Living Home Assistive Devices/Equipment: Cane (specify quad or straight), Walker (specify type) ADL Screening (condition at time of admission) Patient's cognitive ability adequate to safely complete daily activities?: Yes Is the patient deaf or have difficulty hearing?: No Does the patient have difficulty seeing, even when wearing glasses/contacts?: No Does the patient have difficulty concentrating, remembering, or making decisions?: No Patient able to express need for assistance with ADLs?: Yes Does the patient have difficulty dressing or bathing?: No Independently performs ADLs?: Yes (appropriate for developmental age) Does the patient have difficulty walking or climbing stairs?: Yes Weakness of Legs: Both Weakness of Arms/Hands: None  Permission Sought/Granted Permission sought to share information with : Case Manager                Emotional Assessment Appearance:: Appears older than stated age Attitude/Demeanor/Rapport: Engaged Affect (typically observed): Anxious, Accepting Orientation: : Oriented to Self, Oriented to Place, Oriented to  Time, Oriented to Situation Alcohol / Substance Use: Not Applicable Psych Involvement: No (comment)  Admission diagnosis:  Acidosis [E87.2] Hypoxia [R09.02] Acute hypercapnic respiratory  failure ([name]) [J96.02] Acute on chronic congestive heart failure, unspecified heart failure type [facility]) [I50.9] Patient Active Problem List   Diagnosis Date Noted  . Acute hypercapnic  respiratory failure ([place]) 02/25/2020  . Marijuana use 01/17/2020  . Elevated BUN 01/17/2020  . Hypocalcemia 01/17/2020  . Hypochloremia 01/17/2020  . Low thyroid stimulating hormone (TSH) level 01/17/2020  . Vitamin D deficiency 01/17/2020  . Elevated sed rate 01/17/2020  . Thrombocytopenia ([place]) 01/17/2020  . Elevated hematocrit 01/17/2020  . Elevated brain natriuretic peptide (BNP) level 01/17/2020  . Positive hepatitis C antibody test 01/17/2020  . 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 mass index (BMI) of 45.0 to 49.9 in adult ([place]) 01/17/2020  . Osteoarthritis of glenohumeral joints (Bilateral) 01/17/2020  . Osteoarthritis of AC (acromioclavicular) joints (Bilateral) 01/17/2020  . Lumbar Grade 1 Anterolisthesis of L4 over L5 (3 mm) (stable) 01/17/2020  . 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 Lumbar facet osteoarthritis (Multilevel) (Bilateral) 01/17/2020  . Osteoarthritis of hips (Bilateral) 01/17/2020  . Osteoarthritis of knees (Bilateral) 01/17/2020  . Tricompartment osteoarthritis of knees (Bilateral) 01/17/2020  . Patellofemoral arthralgia of knees (Bilateral) 01/17/2020  . Plantar and Achilles calcaneal spurs (Bilateral) 01/17/2020  . Ankle edema (Bilateral) 01/17/2020  . Traumatic osteoarthritis of hands (Bilateral) 01/17/2020  . Polysubstance abuse ([place]) 08/25/2019  . Acute metabolic encephalopathy 96/07/5407  . Acute on chronic systolic CHF (congestive heart failure) ([place]) 08/04/2019  . Acute respiratory failure ([place]) 08/04/2019  . Chronic pain syndrome 07/14/2019  . Pharmacologic therapy 07/14/2019  . Disorder of skeletal system 07/14/2019  . Problems influencing health status 07/14/2019  . Chronic low back pain (1ry area of Pain) (Bilateral) (L>R) w/o sciatica 07/14/2019  . Chronic shoulder pain (2ry area of Pain) (Bilateral) (L>R) 07/14/2019  . Chronic feet pain (3ry area of Pain) (Bilateral) 07/14/2019  . Chronic knee pain  (4th area of Pain) (Bilateral) (L>R) 07/14/2019  . Chronic hip pain (5th area of Pain) (Bilateral) (L>R) 07/14/2019  . Chronic hand pain (6th area of Pain) (Bilateral) (L>R) 07/14/2019  . Lumbar facet arthropathy 07/14/2019  . DDD (degenerative disc disease), lumbosacral 07/14/2019  . Closed fracture of shaft of humerus, sequela (Left) 07/14/2019  . DDD (degenerative disc disease), cervical 07/14/2019  . Acute respiratory failure with hypoxia and hypercapnia ([place]) 05/26/2019  . Acute respiratory failure with hypercapnia ([place]) 10/14/2018  . Acute on chronic combined systolic and diastolic CHF (congestive heart failure) ([place]) 05/28/2018  . Acute and chronic respiratory failure (acute-on-chronic) ([place]) 05/28/2018  . Tobacco use 05/04/2017  . Lymphedema 05/04/2017  . CHF (congestive heart failure) ([place]) 04/12/2017  . Acute on chronic diastolic CHF (congestive heart failure) ([place]) 02/28/2015  . Sleep apnea 02/28/2015  . Acute on chronic respiratory failure with hypoxia ([place])   . Edema of both feet   . Closed fracture of scapula, sequela (Left) 04/01/2014  . Morbid obesity ([place]) 04/01/2014  . HTN (hypertension) 04/01/2014  . Multiple rib fractures 03/27/2014   PCP:  Center, [place]:   [facility] [address] [place] [address] Phone: [phone] Fax: [phone]  [facility]ication [facility]. [place], [address] [place] [address] Phone: [phone] Fax: [phone]  [place], [place] [address] Phone: [phone] Fax: [phone] [place]  [address] [place] [address] Phone: [phone] Fax: [phone]     Social Determinants of Health (SDOH) Interventions    Readmission Risk Interventions Readmission  Risk Prevention Plan 03/01/2020 03/01/2020 03/01/2020  Transportation Screening Complete Complete Complete  PCP or Specialist Appt within 3-5 Days Not Complete Not Complete Not Complete  Not Complete comments To be scheduled by unit secretary will be scheduled by unit clerk To be scheduled by unit Secretary when discharged  [place] or [place] Complete Complete Complete  Social Work Consult for [place] Planning/Counseling Complete Complete Complete  Palliative Care Screening Not Applicable Not Applicable Not Applicable  Medication Review Press photographer) Complete Complete Complete  Some recent data might be hidden

## 2020-03-01 NOTE — Progress Notes (Signed)
Physical Therapy Treatment Patient Details Name: Gary [name] MRN: [id number] DOB: Apr 19, 1955 Today's Date: 03/01/2020    History of Present Illness Pt is 64 y/o M who was admitted for ARF with complaints of weakness x 2 days. History includes CHF and COPD (3L of O2 at night). Of note: pt required intubation from 11/12-11/14    PT Comments    Pt is making good progress towards goals. Pt able to progress to multiple standing trials this date using BRW. Good endurance and able to take a few steps. Still limited by HFNC, O2 sats at 92% with exertion. Will continue to progress.   Follow Up Recommendations  SNF     Equipment Recommendations   (TBD)    Recommendations for Other Services       Precautions / Restrictions Precautions Precautions: Fall Restrictions Weight Bearing Restrictions: No    Mobility  Bed Mobility Overal bed mobility: Needs Assistance Bed Mobility: Supine to Sit     Supine to sit: Supervision     General bed mobility comments: improved sequencing performed and ease of movement. Once seated at EOB, O2 sats remained stable on 8L of HFNC  Transfers Overall transfer level: Needs assistance Equipment used: Rolling walker (2 wheeled) Transfers: Sit to/from Stand Sit to Stand: Min guard         General transfer comment: Bed elevated for ease of mobility. Cues for pushing from seated surface. Able to perform transfer from slightly lower surface compared to previous session  Ambulation/Gait Ambulation/Gait assistance: Min assist Gait Distance (Feet): 3 Feet Assistive device:  (BRW) Gait Pattern/deviations: Step-to pattern     General Gait Details: took a few steps at bedside, limited by HFNC. Safe technique   Stairs             Wheelchair Mobility    Modified Rankin (Stroke Patients Only)       Balance Overall balance assessment: Needs assistance Sitting-balance support: Feet supported;Bilateral upper extremity supported Sitting  balance-[name] Scale: Good     Standing balance support: Bilateral upper extremity supported Standing balance-[name] Scale: Fair                              Cognition Arousal/Alertness: Awake/alert Behavior During Therapy: WFL for tasks assessed/performed Overall Cognitive Status: Within Functional Limits for tasks assessed                                        Exercises Other Exercises Other Exercises: Able to perform 2 sets 5 reps of sit<>stands with BRW. Min assist Other Exercises: Seated ther-ex performed on B LE including LAQ, alt. marching, and B UE shoulder press    General Comments        Pertinent Vitals/Pain Pain Assessment: No/denies pain    Home Living                      Prior Function            PT Goals (current goals can now be found in the care plan section) Acute Rehab PT Goals Patient Stated Goal: to go home PT Goal Formulation: With patient Time For Goal Achievement: 03/14/20 Potential to Achieve Goals: Good Progress towards PT goals: Progressing toward goals    Frequency    Min 2X/week      PT Plan Current plan remains  appropriate    Co-evaluation              AM-PAC PT "6 Clicks" Mobility   Outcome Measure  Help needed turning from your back to your side while in a flat bed without using bedrails?: None Help needed moving from lying on your back to sitting on the side of a flat bed without using bedrails?: A Little Help needed moving to and from a bed to a chair (including a wheelchair)?: A Little Help needed standing up from a chair using your arms (e.g., wheelchair or bedside chair)?: A Little Help needed to walk in hospital room?: A Little Help needed climbing 3-5 steps with a railing? : A Lot 6 Click Score: 18    End of Session Equipment Utilized During Treatment: Gait belt;Oxygen Activity Tolerance: Patient tolerated treatment well Patient left: in bed;with bed alarm set Nurse  Communication: Mobility status PT Visit Diagnosis: Muscle weakness (generalized) (M62.81);Difficulty in walking, not elsewhere classified (R26.2);Unsteadiness on feet (R26.81)     Time: 5749-3552 PT Time Calculation (min) (ACUTE ONLY): 27 min  Charges:  $Therapeutic Exercise: 23-37 mins                     Greggory Stallion, [place], DPT [phone]    Gary [name] 03/01/2020, 2:10 PM
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
## 2020-03-01 NOTE — Progress Notes (Signed)
OT Cancellation Note  Patient Details Name: Gary [name] MRN: [id number] DOB: 1955-11-07   Cancelled Treatment:    Reason Eval/Treat Not Completed: Patient declined, no reason specified;Other (comment)  Upon second time checking on pt this date, he is drowsy and falling asleep. Initially agreeable to waking up and attempting to participate in some ADLs including bathing, but quickly changes his mind and closes his eyes stating "I'll just wait until my wife gets here." Will f/u at later date/time as able/pt agreeable. Thank you.  Gerrianne Scale, [place], OTR/L ascom [phone] 03/01/20, 3:09 PM
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
## 2020-03-01 NOTE — Progress Notes (Signed)
OT Cancellation Note  Patient Details Name: MYCHAEL SMOCK MRN: [id number] DOB: 02/08/1956   Cancelled Treatment:    Reason Eval/Treat Not Completed: Patient at procedure or test/ unavailable  Pt off floor to ultrasound at this time. Will f/u for OT treatment at later date/time as able/pt available. Thank you.  Gerrianne Scale, [place], OTR/L ascom [phone] 03/01/20, 1:52 PM

## 2020-03-01 NOTE — Progress Notes (Addendum)
Nutrition Follow Up Note   DOCUMENTATION CODES:   Morbid obesity  INTERVENTION:   Ensure Enlive po TID, each supplement provides 350 kcal and 20 grams of protein  MVI daily   3 gram sodium restriction in [employer]   NUTRITION DIAGNOSIS:   Inadequate oral intake related to inability to eat as evidenced by NPO status. -progressing   GOAL:   Patient will meet greater than or equal to 90% of their needs -not met   MONITOR:   PO intake, Supplement acceptance, Diet advancement, Labs, Weight trends, Skin, I & O's  ASSESSMENT:   64 year old male with PMHx of HTN, CHF, sleep apnea admitted with acute exacerbation of COPD, acute decompensated diastolic CHF, renal failure.   Pt extubated 11/14. Pt has remained on clear liquids since 11/14. Spoke with MD and RN about advancing pt's diet; will advance to a sodium restricted diet today. Also notified MD that pt has not had a BM since admit; bowel regimen started. RD will add supplements and MVI to help pt meet his estimated needs. Per chart, pt is down 31lbs since admit but appears to be back near his UBW.   Medications reviewed and include: colace, doxycycline  Labs reviewed: K 4.2 wnl, BUN 47(H), creat 1.28(H), P 4.7(H)  Diet Order:   Diet Order            Diet clear liquid Room service appropriate? Yes; Fluid consistency: Thin  Diet effective now                EDUCATION NEEDS:   No education needs have been identified at this time  Skin:  Skin Assessment: Reviewed RN Assessment  Last BM:  Unknown/PTA  Height:   Ht Readings from Last 1 Encounters:  02/25/20 _0  (1.905 m)   Weight:   Wt Readings from Last 1 Encounters:  03/01/20 (!) 161.1 kg   Ideal Body Weight:  89.1 kg  BMI:  Body mass index is 44.39 kg/m.  Estimated Nutritional Needs:   Kcal:  3000-3300kcal/day  Protein:  150g/day  Fluid:  2.7-3.0L/day  Koleen Distance MS, RD, LDN Please refer to [facility] for RD and/or RD on-call/weekend/after  hours pager

## 2020-03-01 NOTE — Plan of Care (Signed)

## 2020-03-02 DIAGNOSIS — J441 Chronic obstructive pulmonary disease with (acute) exacerbation: Secondary | ICD-10-CM

## 2020-03-02 DIAGNOSIS — J449 Chronic obstructive pulmonary disease, unspecified: Secondary | ICD-10-CM

## 2020-03-02 LAB — BLOOD GAS, ARTERIAL
Acid-Base Excess: 5.9 mmol/L — ABNORMAL HIGH (ref 0.0–2.0)
Bicarbonate: 37.7 mmol/L — ABNORMAL HIGH (ref 20.0–28.0)
Expiratory PAP: 8
FIO2: 0.4
Inspiratory PAP: 16
O2 Saturation: 87 %
Patient temperature: 37
pCO2 arterial: 90 mmHg (ref 32.0–48.0)
pH, Arterial: 7.23 — ABNORMAL LOW (ref 7.350–7.450)
pO2, Arterial: 63 mmHg — ABNORMAL LOW (ref 83.0–108.0)

## 2020-03-02 LAB — BLOOD GAS, VENOUS
Acid-Base Excess: 7 mmol/L — ABNORMAL HIGH (ref 0.0–2.0)
Bicarbonate: 37.4 mmol/L — ABNORMAL HIGH (ref 20.0–28.0)
O2 Saturation: 54 %
Patient temperature: 37
pCO2, Ven: 76 mmHg (ref 44.0–60.0)
pH, Ven: 7.3 (ref 7.250–7.430)
pO2, Ven: 32 mmHg (ref 32.0–45.0)

## 2020-03-02 LAB — RENAL FUNCTION PANEL
Albumin: 3.3 g/dL — ABNORMAL LOW (ref 3.5–5.0)
Anion gap: 8 (ref 5–15)
BUN: 50 mg/dL — ABNORMAL HIGH (ref 8–23)
CO2: 32 mmol/L (ref 22–32)
Calcium: 8.1 mg/dL — ABNORMAL LOW (ref 8.9–10.3)
Chloride: 99 mmol/L (ref 98–111)
Creatinine, Ser: 1.53 mg/dL — ABNORMAL HIGH (ref 0.61–1.24)
GFR, Estimated: 51 mL/min — ABNORMAL LOW (ref >60)
Glucose, Bld: 110 mg/dL — ABNORMAL HIGH (ref 70–99)
Phosphorus: 7.1 mg/dL — ABNORMAL HIGH (ref 2.5–4.6)
Potassium: 4.7 mmol/L (ref 3.5–5.1)
Sodium: 139 mmol/L (ref 135–145)

## 2020-03-02 LAB — CBC
HCT: 64.1 % — ABNORMAL HIGH (ref 39.0–52.0)
Hemoglobin: 16.3 g/dL (ref 13.0–17.0)
MCH: 21.4 pg — ABNORMAL LOW (ref 26.0–34.0)
MCHC: 25.4 g/dL — ABNORMAL LOW (ref 30.0–36.0)
MCV: 84.3 fL (ref 80.0–100.0)
Platelets: 100 10*3/uL — ABNORMAL LOW (ref 150–400)
RBC: 7.6 MIL/uL — ABNORMAL HIGH (ref 4.22–5.81)
RDW: 23.1 % — ABNORMAL HIGH (ref 11.5–15.5)
WBC: 7.3 10*3/uL (ref 4.0–10.5)
nRBC: 0 % (ref 0.0–0.2)

## 2020-03-02 LAB — MAGNESIUM: Magnesium: 2.8 mg/dL — ABNORMAL HIGH (ref 1.7–2.4)

## 2020-03-02 LAB — BRAIN NATRIURETIC PEPTIDE: B Natriuretic Peptide: 153.7 pg/mL — ABNORMAL HIGH (ref 0.0–100.0)

## 2020-03-02 MED ORDER — ENOXAPARIN SODIUM 80 MG/0.8ML ~~LOC~~ SOLN
80.0000 mg | SUBCUTANEOUS | Status: DC
  Administered 2020-03-02 – 2020-03-04 (×3): 80 mg via SUBCUTANEOUS
  Filled 2020-03-02 (×3): qty 0.8

## 2020-03-02 NOTE — Progress Notes (Signed)
RT to patient bedside for Bipap QHS placement. Patient declined Bipap earlier in shift stating, "not right now, maybe around midnight." Patient now declining Bipap QHS, will call if he changes his mind. Patient on HFNC 8L tolerating well. No distress noted. Will continue to monitor. Bipap on standby in patient room.

## 2020-03-02 NOTE — Progress Notes (Signed)
PHARMACIST - PHYSICIAN COMMUNICATION  CONCERNING:  Enoxaparin (Lovenox) for DVT Prophylaxis    RECOMMENDATION: Patient was prescribed enoxaprin 40mg  q24 hours for VTE prophylaxis.   Filed Weights   02/28/20 0329 03/01/20 0310 03/02/20 0500  Weight: (!) 164.3 kg (362 lb 3.5 oz) (!) 161.1 kg (355 lb 2.6 oz) (!) 166.2 kg (366 lb 6.4 oz)    Body mass index is 45.8 kg/m.  Estimated Creatinine Clearance: 83 mL/min (A) (by C-G formula based on SCr of 1.53 mg/dL (H)).   Based on [place] patient is candidate for enoxaparin 0.5mg /kg TBW SQ every 24 hours based on BMI being >30.  DESCRIPTION: Pharmacy has adjusted enoxaparin dose per [facility] policy.  Patient is now receiving enoxaparin 80 mg every q24h hours    Angelia Hazell, PharmD Clinical Pharmacist  03/02/2020 9:52 AM

## 2020-03-02 NOTE — Progress Notes (Signed)
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 -pharmacy consultation   DVT/GI PRX ordered -SCDs  TRANSFUSIONS AS NEEDED MONITOR FSBS ASSESS the need for LABS as needed    This document was prepared using Dragon voice recognition software and may include unintentional dictation errors.    [name], M.D.  Division of [place]
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
## 2020-03-02 NOTE — Progress Notes (Signed)
CRITICAL VALUE ALERT  Critical Value: Critical ABG ph 7.23 pco2 90 po2 63  Date & Time Notied:  03/02/20 12:19  Provider Notified: Dr Roosevelt Locks  Orders Received/Actions taken: Pt placed on bipap. Repeat blood gas in 6 hours

## 2020-03-02 NOTE — Progress Notes (Signed)
Pt very sleepy this afternoon. Removed HFNC PT placed back on bipap Provider notified and new orders received. RT at bedside to collect ABG!.  Vitals obtained.  BP 116/84 HR 84 Respirations 22  Oxygen 96% on bipap  Will continue to closely monitor.

## 2020-03-02 NOTE — Progress Notes (Signed)
OT Cancellation Note  Patient Details Name: JARONE OSTERGAARD MRN: [id number] DOB: 1956/01/05   Cancelled Treatment:    Reason Eval/Treat Not Completed: Medical issues which prohibited therapy  RN reports that pt has been taking his nasal cannula off (ws on 8L HFNC) and started presenting very drowsy/lethargic so placed back on Bi-pap. Pt with limited arousal/wakefulness at this time, he will briefly open eyes/attend, but then quickly falls back asleep. Will hold OT treatment at this time and f/u at later date/time as able/appropriate. Thank you.  Gerrianne Scale, [place], OTR/L ascom [phone] 03/02/20, 11:57 AM

## 2020-03-02 NOTE — Progress Notes (Signed)
Mobility Specialist - Progress Note   03/02/20 1152  Mobility  Activity Contraindicated/medical hold  Mobility performed by Mobility specialist    Pt medically inappropriate to be seen at this time. Multiple failed attempt to wake pt verbally and w/ physical touch on the shoulder. Pt would open his eyes but would not respond to mobility specialist. Pt extremely fatigue and lethargic. Nurse was notified. Per discussion w/ nurse, pt refused Bi-Pap last night. Nurse states she will place pt back on Bi-Pap. Will hold off session and re-attempt when pt is appropriate.     Jenifer Struve Mobility Specialist  03/02/20, 11:57 AM

## 2020-03-02 NOTE — Progress Notes (Signed)
Pt removed bipap. Not willing to wear at this time.  O2 dropped to 80%. Pt requesting something to eat and drink.  Pt placed on 8L HFNC RT at bedside Venous blood gas obtained. Pt continues to refuse bipap Not able to redirect.  Will continue to closely monitor.

## 2020-03-03 LAB — CBC WITH DIFFERENTIAL/PLATELET
Abs Immature Granulocytes: 0.02 10*3/uL (ref 0.00–0.07)
Basophils Absolute: 0 10*3/uL (ref 0.0–0.1)
Basophils Relative: 0 %
Eosinophils Absolute: 0.1 10*3/uL (ref 0.0–0.5)
Eosinophils Relative: 1 %
HCT: 58.7 % — ABNORMAL HIGH (ref 39.0–52.0)
Hemoglobin: 15.1 g/dL (ref 13.0–17.0)
Immature Granulocytes: 0 %
Lymphocytes Relative: 13 %
Lymphs Abs: 1.1 10*3/uL (ref 0.7–4.0)
MCH: 21.2 pg — ABNORMAL LOW (ref 26.0–34.0)
MCHC: 25.7 g/dL — ABNORMAL LOW (ref 30.0–36.0)
MCV: 82.6 fL (ref 80.0–100.0)
Monocytes Absolute: 1.2 10*3/uL — ABNORMAL HIGH (ref 0.1–1.0)
Monocytes Relative: 14 %
Neutro Abs: 5.9 10*3/uL (ref 1.7–7.7)
Neutrophils Relative %: 72 %
Platelets: UNDETERMINED 10*3/uL (ref 150–400)
RBC: 7.11 MIL/uL — ABNORMAL HIGH (ref 4.22–5.81)
RDW: 22.9 % — ABNORMAL HIGH (ref 11.5–15.5)
Smear Review: UNDETERMINED
WBC: 8.4 10*3/uL (ref 4.0–10.5)
nRBC: 0.4 % — ABNORMAL HIGH (ref 0.0–0.2)

## 2020-03-03 LAB — BASIC METABOLIC PANEL
Anion gap: 10 (ref 5–15)
BUN: 49 mg/dL — ABNORMAL HIGH (ref 8–23)
CO2: 28 mmol/L (ref 22–32)
Calcium: 8.2 mg/dL — ABNORMAL LOW (ref 8.9–10.3)
Chloride: 99 mmol/L (ref 98–111)
Creatinine, Ser: 1.25 mg/dL — ABNORMAL HIGH (ref 0.61–1.24)
GFR, Estimated: >60 mL/min (ref >60)
Glucose, Bld: 90 mg/dL (ref 70–99)
Potassium: 5.2 mmol/L — ABNORMAL HIGH (ref 3.5–5.1)
Sodium: 137 mmol/L (ref 135–145)

## 2020-03-03 LAB — MAGNESIUM: Magnesium: 3 mg/dL — ABNORMAL HIGH (ref 1.7–2.4)

## 2020-03-03 LAB — GLUCOSE, CAPILLARY: Glucose-Capillary: 113 mg/dL — ABNORMAL HIGH (ref 70–99)

## 2020-03-03 MED ORDER — SODIUM ZIRCONIUM CYCLOSILICATE 10 G PO PACK
10.0000 g | PACK | Freq: Once | ORAL | Status: AC
  Administered 2020-03-03: 10 g via ORAL
  Filled 2020-03-03: qty 1
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
## 2020-03-03 NOTE — Plan of Care (Signed)
  Problem: Clinical Measurements: Goal: Ability to maintain clinical measurements within normal limits will improve Outcome: Progressing Goal: Diagnostic test results will improve Outcome: Progressing Goal: Respiratory complications will improve Outcome: Progressing   Problem: Activity: Goal: Risk for activity intolerance will decrease Outcome: Progressing   Problem: Safety: Goal: Ability to remain free from injury will improve Outcome: Progressing

## 2020-03-03 NOTE — Progress Notes (Signed)
Mobility Specialist - Progress Note   03/03/20 1200  Mobility  Activity Ambulated to bathroom;Transferred:  Bed to chair  Level of Assistance Standby assist, set-up cues, supervision of patient - no hands on  Assistive Device Front wheel walker  Distance Ambulated (ft) 25 ft  Mobility Response Tolerated well  Mobility performed by Mobility specialist  $Mobility charge 1 Mobility    Pre-mobility: 84 HR, 92% SpO2 During mobility: 84 HR, 93% SpO2 Post-mobility: 82 HR, 96% SpO2   Pt was lying in bed upon arrival. Pt agreed to session. Nurse and ST-Nurse present at the time of arrival. Pt  requested to use RR prior to getting to chair. ST-Nurse assisted with transfer. Pt was minA getting EOB. Pt stood to RW SBA from elevated bed height. Upon standing, noted mild LOB that was corrected with time. Pt then c/o light pain in R knee. Pt ambulated to bathroom SBA with no LOB noted during ambulation. Pt had a large BM. Pt performed hygiene tasks on self. Pt denied dizziness, weakness, or SOB throughout session. O2 > 92% throughout session with pt on 8L HFNC. Pt stood from toilet with modA using [place] rails for support. Overall, pt tolerated session well. Pt ambulated to recliner, has all needs in reach, with alarm set. Lunch was placed in front of pt. Pt voiced no needs or signs of distress before exit. Nurse was notified.    Kathee Delton Mobility Specialist 03/03/20, 12:18 PM

## 2020-03-04 DIAGNOSIS — E875 Hyperkalemia: Secondary | ICD-10-CM

## 2020-03-04 DIAGNOSIS — N179 Acute kidney failure, unspecified: Secondary | ICD-10-CM

## 2020-03-04 HISTORY — DX: Acute kidney failure, unspecified: N17.9

## 2020-03-04 LAB — BASIC METABOLIC PANEL
Anion gap: 10 (ref 5–15)
BUN: 38 mg/dL — ABNORMAL HIGH (ref 8–23)
CO2: 30 mmol/L (ref 22–32)
Calcium: 8.1 mg/dL — ABNORMAL LOW (ref 8.9–10.3)
Chloride: 100 mmol/L (ref 98–111)
Creatinine, Ser: 0.89 mg/dL (ref 0.61–1.24)
GFR, Estimated: >60 mL/min (ref >60)
Glucose, Bld: 100 mg/dL — ABNORMAL HIGH (ref 70–99)
Potassium: 4.5 mmol/L (ref 3.5–5.1)
Sodium: 140 mmol/L (ref 135–145)

## 2020-03-04 LAB — HEMOGLOBIN AND HEMATOCRIT, BLOOD
HCT: 58.8 % — ABNORMAL HIGH (ref 39.0–52.0)
HCT: 54.9 % — ABNORMAL HIGH (ref 39.0–52.0)
Hemoglobin: 15.1 g/dL (ref 13.0–17.0)
Hemoglobin: 14.2 g/dL (ref 13.0–17.0)

## 2020-03-04 LAB — MAGNESIUM: Magnesium: 2.8 mg/dL — ABNORMAL HIGH (ref 1.7–2.4)

## 2020-03-04 LAB — CBC WITH DIFFERENTIAL/PLATELET
Abs Immature Granulocytes: 0.03 10*3/uL (ref 0.00–0.07)
Basophils Absolute: 0 10*3/uL (ref 0.0–0.1)
Basophils Relative: 1 %
Eosinophils Absolute: 0.1 10*3/uL (ref 0.0–0.5)
Eosinophils Relative: 1 %
HCT: 59.6 % — ABNORMAL HIGH (ref 39.0–52.0)
Hemoglobin: 15.5 g/dL (ref 13.0–17.0)
Immature Granulocytes: 0 %
Lymphocytes Relative: 13 %
Lymphs Abs: 1.1 10*3/uL (ref 0.7–4.0)
MCH: 21.2 pg — ABNORMAL LOW (ref 26.0–34.0)
MCHC: 26 g/dL — ABNORMAL LOW (ref 30.0–36.0)
MCV: 81.6 fL (ref 80.0–100.0)
Monocytes Absolute: 1 10*3/uL (ref 0.1–1.0)
Monocytes Relative: 12 %
Neutro Abs: 6 10*3/uL (ref 1.7–7.7)
Neutrophils Relative %: 73 %
Platelets: 102 10*3/uL — ABNORMAL LOW (ref 150–400)
RBC: 7.3 MIL/uL — ABNORMAL HIGH (ref 4.22–5.81)
RDW: 22.8 % — ABNORMAL HIGH (ref 11.5–15.5)
Smear Review: NORMAL
WBC: 8.3 10*3/uL (ref 4.0–10.5)
nRBC: 0 % (ref 0.0–0.2)

## 2020-03-04 MED ORDER — PANTOPRAZOLE SODIUM 40 MG IV SOLR
40.0000 mg | Freq: Two times a day (BID) | INTRAVENOUS | Status: DC
  Administered 2020-03-04 – 2020-03-05 (×3): 40 mg via INTRAVENOUS
  Filled 2020-03-04 (×3): qty 40

## 2020-03-04 NOTE — Care Management Note (Signed)
Case Management Note  Patient Details  Name: Gary [name] MRN: [id number] Date of Birth: [birth year]     Patient is pending discharge tomorrow, 03/05/2020. Oxygen to include portables service will be provided by [facility] [phone]. The C-Pap to include education will be provided by [facility] [phone].  Kerin Salen, RN 03/04/2020, 3:31 PM
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
## 2020-03-04 NOTE — Plan of Care (Signed)
1x episode of bloody stools. Notified Dr. Roosevelt Locks; frequent [facility] labs, protonix IV and GI consult initiated.  Problem: Health Behavior/Discharge Planning: Goal: Ability to manage health-related needs will improve Outcome: Progressing   Problem: Clinical Measurements: Goal: Ability to maintain clinical measurements within normal limits will improve Outcome: Progressing Goal: Will remain free from infection Outcome: Progressing Goal: Diagnostic test results will improve Outcome: Progressing Goal: Respiratory complications will improve Outcome: Progressing Goal: Cardiovascular complication will be avoided Outcome: Progressing   Problem: Activity: Goal: Risk for activity intolerance will decrease Outcome: Progressing   Problem: Nutrition: Goal: Adequate nutrition will be maintained Outcome: Progressing   Problem: Coping: Goal: Level of anxiety will decrease Outcome: Progressing   Problem: Elimination: Goal: Will not experience complications related to bowel motility Outcome: Progressing Goal: Will not experience complications related to urinary retention Outcome: Progressing   Problem: Pain Managment: Goal: General experience of comfort will improve Outcome: Progressing   Problem: Safety: Goal: Ability to remain free from injury will improve Outcome: Progressing   Problem: Skin Integrity: Goal: Risk for impaired skin integrity will decrease Outcome: Progressing
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
## 2020-03-05 DIAGNOSIS — I509 Heart failure, unspecified: Secondary | ICD-10-CM

## 2020-03-05 DIAGNOSIS — J9621 Acute and chronic respiratory failure with hypoxia: Secondary | ICD-10-CM

## 2020-03-05 DIAGNOSIS — J9622 Acute and chronic respiratory failure with hypercapnia: Secondary | ICD-10-CM

## 2020-03-05 LAB — CBC WITH DIFFERENTIAL/PLATELET
Abs Immature Granulocytes: 0.02 10*3/uL (ref 0.00–0.07)
Basophils Absolute: 0 10*3/uL (ref 0.0–0.1)
Basophils Relative: 1 %
Eosinophils Absolute: 0.2 10*3/uL (ref 0.0–0.5)
Eosinophils Relative: 2 %
HCT: 55.6 % — ABNORMAL HIGH (ref 39.0–52.0)
Hemoglobin: 14.3 g/dL (ref 13.0–17.0)
Immature Granulocytes: 0 %
Lymphocytes Relative: 17 %
Lymphs Abs: 1.4 10*3/uL (ref 0.7–4.0)
MCH: 20.9 pg — ABNORMAL LOW (ref 26.0–34.0)
MCHC: 25.7 g/dL — ABNORMAL LOW (ref 30.0–36.0)
MCV: 81.4 fL (ref 80.0–100.0)
Monocytes Absolute: 1 10*3/uL (ref 0.1–1.0)
Monocytes Relative: 12 %
Neutro Abs: 5.5 10*3/uL (ref 1.7–7.7)
Neutrophils Relative %: 68 %
Platelets: 109 10*3/uL — ABNORMAL LOW (ref 150–400)
RBC: 6.83 MIL/uL — ABNORMAL HIGH (ref 4.22–5.81)
RDW: 22.5 % — ABNORMAL HIGH (ref 11.5–15.5)
Smear Review: NORMAL
WBC: 8.1 10*3/uL (ref 4.0–10.5)
nRBC: 0 % (ref 0.0–0.2)

## 2020-03-05 LAB — BASIC METABOLIC PANEL
Anion gap: 6 (ref 5–15)
BUN: 27 mg/dL — ABNORMAL HIGH (ref 8–23)
CO2: 32 mmol/L (ref 22–32)
Calcium: 8.4 mg/dL — ABNORMAL LOW (ref 8.9–10.3)
Chloride: 101 mmol/L (ref 98–111)
Creatinine, Ser: 0.9 mg/dL (ref 0.61–1.24)
GFR, Estimated: >60 mL/min (ref >60)
Glucose, Bld: 97 mg/dL (ref 70–99)
Potassium: 4.2 mmol/L (ref 3.5–5.1)
Sodium: 139 mmol/L (ref 135–145)

## 2020-03-05 LAB — HEMOGLOBIN AND HEMATOCRIT, BLOOD
HCT: 53.8 % — ABNORMAL HIGH (ref 39.0–52.0)
HCT: 55.3 % — ABNORMAL HIGH (ref 39.0–52.0)
Hemoglobin: 14.1 g/dL (ref 13.0–17.0)
Hemoglobin: 14.2 g/dL (ref 13.0–17.0)

## 2020-03-05 LAB — MAGNESIUM: Magnesium: 2.5 mg/dL — ABNORMAL HIGH (ref 1.7–2.4)

## 2020-03-05 MED ORDER — PANTOPRAZOLE SODIUM 40 MG PO TBEC: 40.0000 mg | DELAYED_RELEASE_TABLET | Freq: Every day | ORAL | Status: DC

## 2020-03-05 MED ORDER — UMECLIDINIUM-VILANTEROL 62.5-25 MCG/INH IN AEPB: 1.0000 | INHALATION_SPRAY | Freq: Every day | RESPIRATORY_TRACT | 0 refills | Status: DC

## 2020-03-05 MED ORDER — PANTOPRAZOLE SODIUM 40 MG PO TBEC
40.0000 mg | DELAYED_RELEASE_TABLET | Freq: Every day | ORAL | 0 refills | Status: DC
Start: 2020-03-06 — End: 2021-02-23

## 2020-03-05 MED ORDER — IPRATROPIUM-ALBUTEROL 20-100 MCG/ACT IN AERS: 1.0000 | INHALATION_SPRAY | Freq: Four times a day (QID) | RESPIRATORY_TRACT | 0 refills | Status: DC | PRN

## 2020-03-05 MED ORDER — UMECLIDINIUM-VILANTEROL 62.5-25 MCG/INH IN AEPB
1.0000 | INHALATION_SPRAY | Freq: Every day | RESPIRATORY_TRACT | 0 refills | Status: DC
Start: 2020-03-06 — End: 2020-03-05

## 2020-03-05 MED ORDER — PANTOPRAZOLE SODIUM 40 MG PO TBEC
40.0000 mg | DELAYED_RELEASE_TABLET | Freq: Every day | ORAL | 0 refills | Status: DC
Start: 2020-03-06 — End: 2020-03-05

## 2020-03-05 NOTE — Plan of Care (Signed)
  Problem: Health Behavior/Discharge Planning: Goal: Ability to manage health-related needs will improve Outcome: Adequate for Discharge   Problem: Clinical Measurements: Goal: Ability to maintain clinical measurements within normal limits will improve Outcome: Adequate for Discharge Goal: Will remain free from infection Outcome: Adequate for Discharge Goal: Diagnostic test results will improve Outcome: Adequate for Discharge Goal: Respiratory complications will improve Outcome: Adequate for Discharge Goal: Cardiovascular complication will be avoided Outcome: Adequate for Discharge   Problem: Activity: Goal: Risk for activity intolerance will decrease Outcome: Adequate for Discharge   Problem: Nutrition: Goal: Adequate nutrition will be maintained Outcome: Adequate for Discharge   Problem: Coping: Goal: Level of anxiety will decrease Outcome: Adequate for Discharge   Problem: Elimination: Goal: Will not experience complications related to bowel motility Outcome: Adequate for Discharge Goal: Will not experience complications related to urinary retention Outcome: Adequate for Discharge   Problem: Pain Managment: Goal: General experience of comfort will improve Outcome: Adequate for Discharge   Problem: Safety: Goal: Ability to remain free from injury will improve Outcome: Adequate for Discharge   Problem: Skin Integrity: Goal: Risk for impaired skin integrity will decrease Outcome: Adequate for Discharge   

## 2020-03-05 NOTE — Progress Notes (Signed)
Patient discharged per orders. AVS printed and reviewed with patient. Patient demonstrated how to use home CPAP and wore it for a hour and maintained O2 sats greater than 95%, Dr. Roosevelt Locks notified and agreeable to DC. Patient to get further education on CPAP and portable O2 through supplier on Monday.  PIV and tele removed from patient. Patient taken to medical mall entrance via wheelchair by nursing staff.

## 2020-03-05 NOTE — TOC Progression Note (Addendum)
Transition of Care [facility]) - Progression Note    Patient Details  Name: Gary [name] MRN: [id number] Date of Birth: Jan 22, 1956  Transition of Care [facility]) CM/SW Contact  Izola Price, RN Phone Number: 03/05/2020, 1:45 PM  Clinical Narrative:    11/20 1630 Update: Unit RN went over CPAP with patient and conducted a trial use successfully. Provider renewed discharge to Home. [facility] will follow up Monday per [facility] by phone if not in person. [employer] RT will go to home Monday am. Patient already has home oxygen set up. [employer] provided transport oxygen which is already at bedside. Simmie Davies RN CM  11/20 Need to know PTA oxygen provider. CPAP is Astral 100 by [employer].   11/20 1457 Provider is canceling discharge at this time until CPAP can be set up with education via RT at home upon his arrival. All was set up by CM, but not sure if RT availability was known or confirmed for weekend discharge needs. Simmie Davies RN CM  11/20 1400 Pt. To be discharged today but CPAP RT for set up and education is not available till Monday am per [employer]. Pt has difficult airway flag. Unable to ascertain PTA home oxygen provider. VM let for family contact. Spoke with Patient via phone and he cannot remember. Not on record on [employer] or [employer]; contacted both by phone. [employer] delivered transport to home tank today. Contacted [employer] to verify Monday RT education is earliest and he said yes. No RT available till Monday am. Notified provider as wanted CPAP set up and education ready at home before discharge. Simmie Davies RN CM  11/20 Copied from chat message to provider: I am unable to comfirm oxygen at home company that was there PTA. Phone call to family Left VM. Transport O2 deliverd by [employer] already. CPAP delivered to home by [employer], but also not his home oxygen provider PTA. An RT is scheduled to go out Monday am (the earliest) for CPAP set up and education. Family brought CPAP to hospital and patient states he  does not know how to use it. [facility] RT can't touch it bc it is not our equipment. Don't know if that impacts your discharge plans for today but that is what I know at this point. Dominica Severin CM.    Expected Discharge Plan: [place] Barriers to Discharge: Continued Medical Work up  Expected Discharge Plan and Services Expected Discharge Plan: [place] In-house Referral: Clinical Social Work   Post Acute Care Choice: Home Health, Durable Medical Equipment Living arrangements for the past 2 months: Apartment Expected Discharge Date: 03/05/20               DME Arranged: NIV DME Agency: [facility] [place] Date DME Agency Contacted: 03/01/20 Time DME Agency Contacted: 1502 Representative spoke with at DME Agency: Janett Billow HH Arranged: RN, PT, OT, Nurse's Aide, Refused SNF [facility] Agency: [facility] [place] Date [place]: 03/01/20 Time [place]: 44 Representative spoke with at [place]: Janett Billow left message to return call [phone]   Social Determinants of Health (SDOH) Interventions    Readmission Risk Interventions Readmission Risk Prevention Plan 03/01/2020 03/01/2020 03/01/2020  Transportation Screening Complete Complete Complete  PCP or Specialist Appt within 3-5 Days Not Complete Not Complete Not Complete  Not Complete comments To be scheduled by unit secretary will be scheduled by unit clerk To be scheduled by unit Secretary when discharged  [place] or Home Care Consult Complete Complete  Complete  Social Work Consult for [place] Planning/Counseling Complete Complete Complete  Palliative Care Screening Not Applicable Not Applicable Not Applicable  Medication Review Press photographer) Complete Complete Complete  Some recent data might be hidden
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
## 2020-03-05 NOTE — Progress Notes (Signed)
Mobility Specialist - Progress Note   03/05/20 1052  Mobility  Activity Dangled on edge of bed (took a bath sitting EOB)  Level of Assistance Modified independent, requires aide device or extra time  Mobility Response Tolerated fair  Mobility performed by Mobility specialist  $Mobility charge 1 Mobility    Pt laying in bed upon arrival. Pt agreed to session. Pt requested to take a bath. Pt able to get to EOB mod. Independently. SBA for safety. Pt able to wash himself up after set up, mod. Independently. Pt initially would not follow simple commands. Pt would prepare to get up w/o notice. Pt encouraged to take his time and to wash up sitting EOB rather than washing up by the sink to conserve energy. Noted pt O2 sat 90% at rest prior to session. Pt required extra time d/t being SOB while washing up. Pt sitting 85-90% during session. Pt on 3.5L HFNC. Pt educated on energy conservation and PLB. Overall, pt tolerated session fair. Pt able to wash up LE and UE w/ mobility specialist. Pt needed a new gown and more towels, nurse and NT notified. Pt left w/ NT to assist him w/ finishing his bath.     Shanon Seawright Mobility Specialist  03/05/20, 10:57 AM

## 2020-03-05 NOTE — Discharge Instructions (Signed)
1.  Fluid restriction less than 1500 mL/day.  Low salt heart healthy diet. 2.  It is critically important to wear CPAP at nighttime. 3.  Follow-up with PCP in 1 week. 4.  Follow-up with heart failure clinic as scheduled.
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
## 2020-03-05 NOTE — TOC Transition Note (Signed)
Transition of Care [facility]) - CM/SW Discharge Note   Patient Details  Name: Gary [name] MRN: [id number] Date of Birth: 1955/08/19  [facility] CM/SW Contact:  Izola Price, RN Phone Number: 03/05/2020, 4:34 PM   Clinical Narrative:   4098 Pt being discharged after trial CPAP use by Unit RN. Provider renewed discharge orders. [facility] via Tillie Rung and will contact patient Monday. [facility]. Therapist will visit Monday per [place].  [facility] provided oxygen for transport. Already has oxygen set up at home PTA for 3L/[place] [facility]. CM will sign off pending discharge. Barriers resolved. Simmie Davies RN CM    Final next level of care: Home w Home Health Services Barriers to Discharge: Barriers Resolved   Patient Goals and CMS Choice Patient states their goals for this hospitalization and ongoing recovery are:: Return home lives with friend.      Discharge Placement                       Discharge Plan and Services In-house Referral: Clinical Social Work   Post Acute Care Choice: [place]          DME Arranged: Oxygen ([facility] for transport and [facility] for CPAP) DME Agency: [facility], Other - Comment ([facility] for transport oxygen. Pt has set up already at home.) Date DME Agency Contacted: 03/05/20 Time DME Agency Contacted: [id number] Representative spoke with at DME Agency: Pura Spice at [place], Edwyna Ready at [facility] [place] at [place]: RN, [employer], [place]: [facility] [place] Date [place]: 03/05/20 Time [place]: [place] Representative spoke with at [place]: [place] ([place]) Interventions     Readmission Risk Interventions Readmission Risk Prevention Plan 03/01/2020 03/01/2020 03/01/2020  Transportation Screening Complete Complete Complete  PCP or Specialist Appt within 3-5 Days Not Complete Not Complete Not Complete  Not Complete comments To be scheduled by unit secretary will be scheduled  by unit clerk To be scheduled by unit Secretary when discharged  [place] or [place] Complete Complete Complete  Social Work Consult for [place] Planning/Counseling Complete Complete Complete  Palliative Care Screening Not Applicable Not Applicable Not Applicable  Medication Review Press photographer) Complete Complete Complete  Some recent data might be hidden
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
## 2020-03-15 ENCOUNTER — Other Ambulatory Visit: Payer: Self-pay

## 2020-03-15 ENCOUNTER — Encounter: Payer: Self-pay | Admitting: Physician Assistant

## 2020-03-15 ENCOUNTER — Ambulatory Visit (INDEPENDENT_AMBULATORY_CARE_PROVIDER_SITE_OTHER): Payer: Medicaid Other | Admitting: Physician Assistant

## 2020-03-15 VITALS — BP 94/58 | HR 87 | Ht 75.0 in | Wt 363.0 lb

## 2020-03-15 DIAGNOSIS — J9611 Chronic respiratory failure with hypoxia: Secondary | ICD-10-CM | POA: Diagnosis not present

## 2020-03-15 DIAGNOSIS — I10 Essential (primary) hypertension: Secondary | ICD-10-CM | POA: Diagnosis not present

## 2020-03-15 DIAGNOSIS — Z9114 Patient's other noncompliance with medication regimen: Secondary | ICD-10-CM

## 2020-03-15 DIAGNOSIS — Z720 Tobacco use: Secondary | ICD-10-CM

## 2020-03-15 DIAGNOSIS — I5032 Chronic diastolic (congestive) heart failure: Secondary | ICD-10-CM

## 2020-03-15 DIAGNOSIS — J9612 Chronic respiratory failure with hypercapnia: Secondary | ICD-10-CM

## 2020-03-15 DIAGNOSIS — R7989 Other specified abnormal findings of blood chemistry: Secondary | ICD-10-CM

## 2020-03-15 DIAGNOSIS — I5022 Chronic systolic (congestive) heart failure: Secondary | ICD-10-CM | POA: Diagnosis not present

## 2020-03-15 DIAGNOSIS — G4733 Obstructive sleep apnea (adult) (pediatric): Secondary | ICD-10-CM

## 2020-03-15 MED ORDER — LOSARTAN POTASSIUM 25 MG PO TABS: 25.0000 mg | ORAL_TABLET | Freq: Every day | ORAL | 5 refills | Status: DC

## 2020-03-15 NOTE — Patient Instructions (Signed)
Medication Instructions:   DECREASE your Losartan to 25 MG daily.  *If you need a refill on your cardiac medications before your next appointment, please call your pharmacy*   Lab Work: BMET to be drawn today. If you have labs (blood work) drawn today and your tests are completely normal, you will receive your results only by: [name] MyChart Message (if you have MyChart) OR . A paper copy in the mail If you have any lab test that is abnormal or we need to change your treatment, we will call you to review the results.   Testing/Procedures: None Ordered   Follow-Up: At [facility], you and your health needs are our priority.  As part of our continuing mission to provide you with exceptional heart care, we have created designated Provider Care Teams.  These Care Teams include your primary Cardiologist (physician) and Advanced Practice Providers (APPs -  Physician Assistants and Nurse Practitioners) who all work together to provide you with the care you need, when you need it.  We recommend signing up for the patient portal called "MyChart".  Sign up information is provided on this After Visit Summary.  MyChart is used to connect with patients for Virtual Visits (Telemedicine).  Patients are able to view lab/test results, encounter notes, upcoming appointments, etc.  Non-urgent messages can be sent to your provider as well.   To learn more about what you can do with MyChart, go to [email or web address].    Your next appointment:   Next available with Dr.Gollan   The format for your next appointment:   In Person  Provider:   Ida Rogue, MD   Other Instructions

## 2020-03-16 LAB — BASIC METABOLIC PANEL
BUN/Creatinine Ratio: 23 (ref 10–24)
BUN: 23 mg/dL (ref 8–27)
CO2: 29 mmol/L (ref 20–29)
Calcium: 8.8 mg/dL (ref 8.6–10.2)
Chloride: 104 mmol/L (ref 96–106)
Creatinine, Ser: 1.02 mg/dL (ref 0.76–1.27)
GFR calc Af Amer: 91 mL/min/{1.73_m2} (ref >59)
GFR calc non Af Amer: 78 mL/min/{1.73_m2} (ref >59)
Glucose: 131 mg/dL — ABNORMAL HIGH (ref 65–99)
Potassium: 3.8 mmol/L (ref 3.5–5.2)
Sodium: 145 mmol/L — ABNORMAL HIGH (ref 134–144)

## 2020-03-17 ENCOUNTER — Encounter: Payer: Self-pay | Admitting: *Deleted
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
## 2020-03-29 ENCOUNTER — Ambulatory Visit: Payer: Medicaid Other | Attending: Family | Admitting: Family

## 2020-03-29 ENCOUNTER — Other Ambulatory Visit: Payer: Self-pay

## 2020-03-29 ENCOUNTER — Encounter: Payer: Self-pay | Admitting: Family

## 2020-03-29 VITALS — BP 113/74 | HR 78 | Resp 20 | Ht 75.0 in | Wt 365.5 lb

## 2020-03-29 DIAGNOSIS — R42 Dizziness and giddiness: Secondary | ICD-10-CM | POA: Diagnosis not present

## 2020-03-29 DIAGNOSIS — G4733 Obstructive sleep apnea (adult) (pediatric): Secondary | ICD-10-CM | POA: Diagnosis not present

## 2020-03-29 DIAGNOSIS — R0602 Shortness of breath: Secondary | ICD-10-CM | POA: Diagnosis present

## 2020-03-29 DIAGNOSIS — Z720 Tobacco use: Secondary | ICD-10-CM

## 2020-03-29 DIAGNOSIS — Z79899 Other long term (current) drug therapy: Secondary | ICD-10-CM | POA: Insufficient documentation

## 2020-03-29 DIAGNOSIS — R5383 Other fatigue: Secondary | ICD-10-CM | POA: Diagnosis not present

## 2020-03-29 DIAGNOSIS — Z87891 Personal history of nicotine dependence: Secondary | ICD-10-CM | POA: Insufficient documentation

## 2020-03-29 DIAGNOSIS — I110 Hypertensive heart disease with heart failure: Secondary | ICD-10-CM | POA: Diagnosis not present

## 2020-03-29 DIAGNOSIS — R079 Chest pain, unspecified: Secondary | ICD-10-CM | POA: Insufficient documentation

## 2020-03-29 DIAGNOSIS — I5032 Chronic diastolic (congestive) heart failure: Secondary | ICD-10-CM | POA: Diagnosis not present

## 2020-03-29 DIAGNOSIS — Z7901 Long term (current) use of anticoagulants: Secondary | ICD-10-CM | POA: Insufficient documentation

## 2020-03-29 DIAGNOSIS — Z7982 Long term (current) use of aspirin: Secondary | ICD-10-CM | POA: Insufficient documentation

## 2020-03-29 DIAGNOSIS — I10 Essential (primary) hypertension: Secondary | ICD-10-CM

## 2020-03-29 MED ORDER — SPIRONOLACTONE 25 MG PO TABS
25.0000 mg | ORAL_TABLET | Freq: Every day | ORAL | 3 refills | Status: DC
Start: 2020-03-29 — End: 2020-05-02

## 2020-03-29 NOTE — Patient Instructions (Signed)
Continue weighing daily and call for an overnight weight gain of > 2 pounds or a weekly weight gain of >5 pounds. 

## 2020-04-29 ENCOUNTER — Ambulatory Visit: Payer: Medicaid Other | Admitting: Cardiovascular Disease

## 2020-04-29 ENCOUNTER — Telehealth: Payer: Self-pay | Admitting: Cardiovascular Disease

## 2020-04-29 NOTE — Telephone Encounter (Signed)
  Patient Consent for Virtual Visit         Gary [name] has provided verbal consent on 04/29/2020 for a virtual visit (video or telephone).   CONSENT FOR VIRTUAL VISIT FOR:  Gary [name]  By participating in this virtual visit I agree to the following:  I hereby voluntarily request, consent and authorize [facility] and its employed or contracted physicians, physician assistants, nurse practitioners or other licensed health care professionals (the Practitioner), to provide me with telemedicine health care services (the "Services") as deemed necessary by the treating Practitioner. I acknowledge and consent to receive the Services by the Practitioner via telemedicine. I understand that the telemedicine visit will involve communicating with the Practitioner through live audiovisual communication technology and the disclosure of certain medical information by electronic transmission. I acknowledge that I have been given the opportunity to request an in-person assessment or other available alternative prior to the telemedicine visit and am voluntarily participating in the telemedicine visit.  I understand that I have the right to withhold or withdraw my consent to the use of telemedicine in the course of my care at any time, without affecting my right to future care or treatment, and that the Practitioner or I may terminate the telemedicine visit at any time. I understand that I have the right to inspect all information obtained and/or recorded in the course of the telemedicine visit and may receive copies of available information for a reasonable fee.  I understand that some of the potential risks of receiving the Services via telemedicine include:  [name] Delay or interruption in medical evaluation due to technological equipment failure or disruption; . Information transmitted may not be sufficient (e.g. poor resolution of images) to allow for appropriate medical decision making by the Practitioner;  and/or  . In rare instances, security protocols could fail, causing a breach of personal health information.  Furthermore, I acknowledge that it is my responsibility to provide information about my medical history, conditions and care that is complete and accurate to the best of my ability. I acknowledge that Practitioner's advice, recommendations, and/or decision may be based on factors not within their control, such as incomplete or inaccurate data provided by me or distortions of diagnostic images or specimens that may result from electronic transmissions. I understand that the practice of medicine is not an exact science and that Practitioner makes no warranties or guarantees regarding treatment outcomes. I acknowledge that a copy of this consent can be made available to me via my patient portal ([facility]), or I can request a printed copy by calling the office of [facility].    I understand that my insurance will be billed for this visit.   I have read or had this consent read to me. . I understand the contents of this consent, which adequately explains the benefits and risks of the Services being provided via telemedicine.  . I have been provided ample opportunity to ask questions regarding this consent and the Services and have had my questions answered to my satisfaction. . I give my informed consent for the services to be provided through the use of telemedicine in my medical care
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
## 2020-05-02 ENCOUNTER — Other Ambulatory Visit: Payer: Self-pay

## 2020-05-02 ENCOUNTER — Telehealth (INDEPENDENT_AMBULATORY_CARE_PROVIDER_SITE_OTHER): Payer: Medicaid Other | Admitting: Cardiovascular Disease

## 2020-05-02 ENCOUNTER — Encounter: Payer: Self-pay | Admitting: Cardiovascular Disease

## 2020-05-02 VITALS — Ht 75.0 in | Wt 355.0 lb

## 2020-05-02 DIAGNOSIS — J9611 Chronic respiratory failure with hypoxia: Secondary | ICD-10-CM

## 2020-05-02 DIAGNOSIS — Z720 Tobacco use: Secondary | ICD-10-CM

## 2020-05-02 DIAGNOSIS — I10 Essential (primary) hypertension: Secondary | ICD-10-CM

## 2020-05-02 DIAGNOSIS — I5032 Chronic diastolic (congestive) heart failure: Secondary | ICD-10-CM

## 2020-05-02 DIAGNOSIS — I890 Lymphedema, not elsewhere classified: Secondary | ICD-10-CM

## 2020-05-02 DIAGNOSIS — G4733 Obstructive sleep apnea (adult) (pediatric): Secondary | ICD-10-CM

## 2020-05-02 DIAGNOSIS — J9612 Chronic respiratory failure with hypercapnia: Secondary | ICD-10-CM

## 2020-05-02 MED ORDER — SIMVASTATIN 10 MG PO TABS
10.0000 mg | ORAL_TABLET | Freq: Every day | ORAL | 3 refills | Status: DC
Start: 2020-05-02 — End: 2021-02-23

## 2020-05-02 MED ORDER — LOSARTAN POTASSIUM 25 MG PO TABS: 25.0000 mg | ORAL_TABLET | Freq: Every day | ORAL | 3 refills | Status: DC

## 2020-05-02 MED ORDER — TORSEMIDE 20 MG PO TABS: 40.0000 mg | ORAL_TABLET | Freq: Every day | ORAL | 3 refills | Status: DC

## 2020-05-02 MED ORDER — CARVEDILOL 6.25 MG PO TABS
6.2500 mg | ORAL_TABLET | Freq: Two times a day (BID) | ORAL | 3 refills | Status: DC
Start: 2020-05-02 — End: 2021-02-23

## 2020-05-02 MED ORDER — SPIRONOLACTONE 25 MG PO TABS
25.0000 mg | ORAL_TABLET | Freq: Every day | ORAL | 3 refills | Status: DC
Start: 2020-05-02 — End: 2021-02-23

## 2020-05-02 NOTE — Patient Instructions (Addendum)
Medication Instructions:  All your cardiac medications were sent in for a 90 day supply refill   Lab work: BMP ( have this lab drawn when you have availability)   Walk into medical mall at the check in desk, they will direct you to lab registration, hours for labs are Monday-Friday 07:00am-5:30pm (no appointment necessary)  Testing/Procedures: No new testing needed   Follow-Up: At [facility], you and your health needs are our priority.  As part of our continuing mission to provide you with exceptional heart care, we have created designated Provider Care Teams.  These Care Teams include your primary Cardiologist (physician) and Advanced Practice Providers (APPs -  Physician Assistants and Nurse Practitioners) who all work together to provide you with the care you need, when you need it.  . You will need a follow up appointment in 3 months, APP ok  . Providers on your designated Care Team:   . Murray Hodgkins, NP . Christell Faith, PA-C . Marrianne Mood, PA-C  Any Other Special Instructions Will Be Listed Below (If Applicable).  COVID-19 Vaccine Information can be found at: [email or web address].uk For questions related to vaccine distribution or appointments, please email [email or web address] or call [phone].
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
## 2020-06-27 ENCOUNTER — Encounter: Payer: Self-pay | Admitting: Family

## 2020-06-27 ENCOUNTER — Ambulatory Visit: Payer: Medicaid Other | Admitting: Family

## 2020-06-27 ENCOUNTER — Other Ambulatory Visit
Admission: RE | Admit: 2020-06-27 | Discharge: 2020-06-27 | Disposition: A | Discharge status: Home / Self Care | Payer: Medicaid Other | Source: Ambulatory Visit | Attending: Family | Admitting: Family

## 2020-06-27 ENCOUNTER — Other Ambulatory Visit: Payer: Self-pay

## 2020-06-27 VITALS — BP 117/78 | HR 70 | Resp 18 | Ht 75.0 in | Wt >= 6400 oz

## 2020-06-27 DIAGNOSIS — G4733 Obstructive sleep apnea (adult) (pediatric): Secondary | ICD-10-CM

## 2020-06-27 DIAGNOSIS — I5032 Chronic diastolic (congestive) heart failure: Secondary | ICD-10-CM | POA: Insufficient documentation

## 2020-06-27 DIAGNOSIS — I110 Hypertensive heart disease with heart failure: Secondary | ICD-10-CM | POA: Insufficient documentation

## 2020-06-27 DIAGNOSIS — I10 Essential (primary) hypertension: Secondary | ICD-10-CM

## 2020-06-27 DIAGNOSIS — Z7982 Long term (current) use of aspirin: Secondary | ICD-10-CM | POA: Insufficient documentation

## 2020-06-27 DIAGNOSIS — Z87891 Personal history of nicotine dependence: Secondary | ICD-10-CM | POA: Insufficient documentation

## 2020-06-27 DIAGNOSIS — Z720 Tobacco use: Secondary | ICD-10-CM

## 2020-06-27 LAB — BASIC METABOLIC PANEL WITH GFR
Anion gap: 5 (ref 5–15)
BUN: 18 mg/dL (ref 8–23)
CO2: 30 mmol/L (ref 22–32)
Calcium: 8.3 mg/dL — ABNORMAL LOW (ref 8.9–10.3)
Chloride: 104 mmol/L (ref 98–111)
Creatinine, Ser: 1.51 mg/dL — ABNORMAL HIGH (ref 0.61–1.24)
GFR, Estimated: 52 mL/min — ABNORMAL LOW
Glucose, Bld: 75 mg/dL (ref 70–99)
Potassium: 4.2 mmol/L (ref 3.5–5.1)
Sodium: 139 mmol/L (ref 135–145)

## 2020-06-27 NOTE — Patient Instructions (Addendum)
Resume weighing daily and call for an overnight weight gain of > 2 pounds or a weekly weight gain of >5 pounds. 
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
## 2020-08-01 ENCOUNTER — Ambulatory Visit: Payer: Medicaid Other | Admitting: Cardiovascular Disease

## 2020-08-01 DIAGNOSIS — G4733 Obstructive sleep apnea (adult) (pediatric): Secondary | ICD-10-CM

## 2020-08-01 DIAGNOSIS — I5032 Chronic diastolic (congestive) heart failure: Secondary | ICD-10-CM

## 2020-08-01 DIAGNOSIS — I10 Essential (primary) hypertension: Secondary | ICD-10-CM

## 2020-08-01 DIAGNOSIS — Z720 Tobacco use: Secondary | ICD-10-CM

## 2020-08-01 DIAGNOSIS — I890 Lymphedema, not elsewhere classified: Secondary | ICD-10-CM

## 2020-08-02 ENCOUNTER — Encounter: Payer: Self-pay | Admitting: Cardiovascular Disease

## 2020-08-15 ENCOUNTER — Telehealth: Payer: Self-pay | Admitting: Cardiovascular Disease

## 2020-08-15 NOTE — Telephone Encounter (Signed)
Received fax from [facility] (OV Notes-07/25/20 and Labs). Pt is overdue for 3 month f/u with Dr. [name] or APP.   Please call pt to schedule. Thank you!

## 2020-08-15 NOTE — Telephone Encounter (Signed)
Attempted to schedule.  LMOV to call office.  ° °

## 2020-08-25 NOTE — Telephone Encounter (Signed)
Attempted to schedule.  LMOV to call office.  ° °

## 2020-09-27 ENCOUNTER — Ambulatory Visit: Payer: Medicaid Other | Attending: Family | Admitting: Family

## 2020-09-27 ENCOUNTER — Encounter: Payer: Self-pay | Admitting: Family

## 2020-09-27 ENCOUNTER — Encounter: Payer: Self-pay | Admitting: Pharmacist

## 2020-09-27 ENCOUNTER — Other Ambulatory Visit: Payer: Self-pay

## 2020-09-27 VITALS — BP 131/76 | HR 85 | Resp 18 | Ht 75.0 in | Wt >= 6400 oz

## 2020-09-27 DIAGNOSIS — J449 Chronic obstructive pulmonary disease, unspecified: Secondary | ICD-10-CM | POA: Insufficient documentation

## 2020-09-27 DIAGNOSIS — Z79899 Other long term (current) drug therapy: Secondary | ICD-10-CM | POA: Insufficient documentation

## 2020-09-27 DIAGNOSIS — Z8719 Personal history of other diseases of the digestive system: Secondary | ICD-10-CM | POA: Diagnosis not present

## 2020-09-27 DIAGNOSIS — I110 Hypertensive heart disease with heart failure: Secondary | ICD-10-CM | POA: Diagnosis not present

## 2020-09-27 DIAGNOSIS — Z7982 Long term (current) use of aspirin: Secondary | ICD-10-CM | POA: Diagnosis not present

## 2020-09-27 DIAGNOSIS — Z720 Tobacco use: Secondary | ICD-10-CM

## 2020-09-27 DIAGNOSIS — Z87891 Personal history of nicotine dependence: Secondary | ICD-10-CM | POA: Diagnosis not present

## 2020-09-27 DIAGNOSIS — I5032 Chronic diastolic (congestive) heart failure: Secondary | ICD-10-CM | POA: Insufficient documentation

## 2020-09-27 DIAGNOSIS — Z9989 Dependence on other enabling machines and devices: Secondary | ICD-10-CM | POA: Insufficient documentation

## 2020-09-27 DIAGNOSIS — G4733 Obstructive sleep apnea (adult) (pediatric): Secondary | ICD-10-CM | POA: Diagnosis not present

## 2020-09-27 DIAGNOSIS — I10 Essential (primary) hypertension: Secondary | ICD-10-CM
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
## 2020-09-27 NOTE — Patient Instructions (Signed)
Continue weighing daily and call for an overnight weight gain of > 2 pounds or a weekly weight gain of >5 pounds. 
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
## 2020-10-05 NOTE — Telephone Encounter (Signed)
Scheduled

## 2020-10-26 ENCOUNTER — Ambulatory Visit: Payer: Medicaid Other | Admitting: Pain Medicine
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
## 2020-10-31 ENCOUNTER — Other Ambulatory Visit: Payer: Self-pay

## 2020-10-31 ENCOUNTER — Ambulatory Visit: Payer: Medicaid Other | Attending: Pain Medicine | Admitting: Pain Medicine

## 2020-10-31 ENCOUNTER — Encounter: Payer: Self-pay | Admitting: Pain Medicine

## 2020-10-31 VITALS — BP 121/84 | HR 73 | Temp 97.4°F | Ht 75.0 in | Wt >= 6400 oz

## 2020-10-31 DIAGNOSIS — M79671 Pain in right foot: Secondary | ICD-10-CM | POA: Diagnosis not present

## 2020-10-31 DIAGNOSIS — M25511 Pain in right shoulder: Secondary | ICD-10-CM | POA: Diagnosis not present

## 2020-10-31 DIAGNOSIS — M25562 Pain in left knee: Secondary | ICD-10-CM | POA: Insufficient documentation

## 2020-10-31 DIAGNOSIS — M47816 Spondylosis without myelopathy or radiculopathy, lumbar region: Secondary | ICD-10-CM | POA: Diagnosis not present

## 2020-10-31 DIAGNOSIS — M25551 Pain in right hip: Secondary | ICD-10-CM

## 2020-10-31 DIAGNOSIS — M79672 Pain in left foot: Secondary | ICD-10-CM | POA: Diagnosis not present

## 2020-10-31 DIAGNOSIS — M79641 Pain in right hand: Secondary | ICD-10-CM | POA: Diagnosis not present

## 2020-10-31 DIAGNOSIS — M25561 Pain in right knee: Secondary | ICD-10-CM | POA: Diagnosis not present

## 2020-10-31 DIAGNOSIS — Z6841 Body Mass Index (BMI) 40.0 and over, adult: Secondary | ICD-10-CM

## 2020-10-31 DIAGNOSIS — G8929 Other chronic pain: Secondary | ICD-10-CM

## 2020-10-31 DIAGNOSIS — M545 Low back pain, unspecified: Secondary | ICD-10-CM

## 2020-10-31 DIAGNOSIS — G894 Chronic pain syndrome: Secondary | ICD-10-CM | POA: Diagnosis not present

## 2020-10-31 DIAGNOSIS — F1290 Cannabis use, unspecified, uncomplicated: Secondary | ICD-10-CM | POA: Diagnosis not present

## 2020-10-31 DIAGNOSIS — E662 Morbid (severe) obesity with alveolar hypoventilation: Secondary | ICD-10-CM | POA: Diagnosis not present

## 2020-10-31 DIAGNOSIS — M4316 Spondylolisthesis, lumbar region: Secondary | ICD-10-CM

## 2020-10-31 DIAGNOSIS — M5137 Other intervertebral disc degeneration, lumbosacral region: Secondary | ICD-10-CM | POA: Diagnosis not present

## 2020-10-31 DIAGNOSIS — M79642 Pain in left hand: Secondary | ICD-10-CM | POA: Insufficient documentation

## 2020-10-31 DIAGNOSIS — M159 Polyosteoarthritis, unspecified: Secondary | ICD-10-CM | POA: Insufficient documentation

## 2020-10-31 DIAGNOSIS — M25512 Pain in left shoulder: Secondary | ICD-10-CM | POA: Insufficient documentation

## 2020-10-31 DIAGNOSIS — M47817 Spondylosis without myelopathy or radiculopathy, lumbosacral region: Secondary | ICD-10-CM | POA: Insufficient documentation

## 2020-10-31 DIAGNOSIS — M25552 Pain in left hip: Secondary | ICD-10-CM

## 2020-10-31 NOTE — Patient Instructions (Addendum)
____________________________________________________________________________________________  Preparing for Procedure with Sedation  Procedure appointments are limited to planned procedures: No Prescription Refills. No disability issues will be discussed. No medication changes will be discussed.  Instructions: Oral Intake: Do not eat or drink anything for at least 8 hours prior to your procedure. (Exception: Blood Pressure Medication. See below.) Transportation: Unless otherwise stated by your physician, you may drive yourself after the procedure. Blood Pressure Medicine: Do not forget to take your blood pressure medicine with a sip of water the morning of the procedure. If your Diastolic (lower reading)is above 100 mmHg, elective cases will be cancelled/rescheduled. Blood thinners: These will need to be stopped for procedures. Notify our staff if you are taking any blood thinners. Depending on which one you take, there will be specific instructions on how and when to stop it. Diabetics on insulin: Notify the staff so that you can be scheduled 1st case in the morning. If your diabetes requires high dose insulin, take only  of your normal insulin dose the morning of the procedure and notify the staff that you have done so. Preventing infections: Shower with an antibacterial soap the morning of your procedure. Build-up your immune system: Take 1000 mg of Vitamin C with every meal (3 times a day) the day prior to your procedure. Antibiotics: Inform the staff if you have a condition or reason that requires you to take antibiotics before dental procedures. Pregnancy: If you are pregnant, call and cancel the procedure. Sickness: If you have a cold, fever, or any active infections, call and cancel the procedure. Arrival: You must be in the facility at least 30 minutes prior to your scheduled procedure. Children: Do not bring children with you. Dress appropriately: Bring dark clothing that you would  not mind if they get stained. Valuables: Do not bring any jewelry or valuables.  Reasons to call and reschedule or cancel your procedure: (Following these recommendations will minimize the risk of a serious complication.) Surgeries: Avoid having procedures within 2 weeks of any surgery. (Avoid for 2 weeks before or after any surgery). Flu Shots: Avoid having procedures within 2 weeks of a flu shots or . (Avoid for 2 weeks before or after immunizations). Barium: Avoid having a procedure within 7-10 days after having had a radiological study involving the use of radiological contrast. (Myelograms, Barium swallow or enema study). Heart attacks: Avoid any elective procedures or surgeries for the initial 6 months after a "Myocardial Infarction" (Heart Attack). Blood thinners: It is imperative that you stop these medications before procedures. Let us know if you if you take any blood thinner.  Infection: Avoid procedures during or within two weeks of an infection (including chest colds or gastrointestinal problems). Symptoms associated with infections include: Localized redness, fever, chills, night sweats or profuse sweating, burning sensation when voiding, cough, congestion, stuffiness, runny nose, sore throat, diarrhea, nausea, vomiting, cold or Flu symptoms, recent or current infections. It is specially important if the infection is over the area that we intend to treat. Heart and lung problems: Symptoms that may suggest an active cardiopulmonary problem include: cough, chest pain, breathing difficulties or shortness of breath, dizziness, ankle swelling, uncontrolled high or unusually low blood pressure, and/or palpitations. If you are experiencing any of these symptoms, cancel your procedure and contact your primary care physician for an evaluation.  Remember:  Regular Business hours are:  Monday to Thursday 8:00 AM to 4:00 PM  Provider's Schedule: Milinda Pointer, MD:  Procedure days: Tuesday and  Thursday 7:30 AM  to 4:00 PM  Bilal Lateef, MD:  Procedure days: Monday and Wednesday 7:30 AM to 4:00 PM ____________________________________________________________________________________________  ____________________________________________________________________________________________  General Risks and Possible Complications  Patient Responsibilities: It is important that you read this as it is part of your informed consent. It is our duty to inform you of the risks and possible complications associated with treatments offered to you. It is your responsibility as a patient to read this and to ask questions about anything that is not clear or that you believe was not covered in this document.  Patient's Rights: You have the right to refuse treatment. You also have the right to change your mind, even after initially having agreed to have the treatment done. However, under this last option, if you wait until the last second to change your mind, you may be charged for the materials used up to that point.  Introduction: Medicine is not an exact science. Everything in Medicine, including the lack of treatment(s), carries the potential for danger, harm, or loss (which is by definition: Risk). In Medicine, a complication is a secondary problem, condition, or disease that can aggravate an already existing one. All treatments carry the risk of possible complications. The fact that a side effects or complications occurs, does not imply that the treatment was conducted incorrectly. It must be clearly understood that these can happen even when everything is done following the highest safety standards.  No treatment: You can choose not to proceed with the proposed treatment alternative. The "PRO(s)" would include: avoiding the risk of complications associated with the therapy. The "CON(s)" would include: not getting any of the treatment benefits. These benefits fall under one of three categories: diagnostic;  therapeutic; and/or palliative. Diagnostic benefits include: getting information which can ultimately lead to improvement of the disease or symptom(s). Therapeutic benefits are those associated with the successful treatment of the disease. Finally, palliative benefits are those related to the decrease of the primary symptoms, without necessarily curing the condition (example: decreasing the pain from a flare-up of a chronic condition, such as incurable terminal cancer).  General Risks and Complications: These are associated to most interventional treatments. They can occur alone, or in combination. They fall under one of the following six (6) categories: no benefit or worsening of symptoms; bleeding; infection; nerve damage; allergic reactions; and/or death. No benefits or worsening of symptoms: In Medicine there are no guarantees, only probabilities. No healthcare provider can ever guarantee that a medical treatment will work, they can only state the probability that it may. Furthermore, there is always the possibility that the condition may worsen, either directly, or indirectly, as a consequence of the treatment. Bleeding: This is more common if the patient is taking a blood thinner, either prescription or over the counter (example: Goody Powders, Fish oil, Aspirin, Garlic, etc.), or if suffering a condition associated with impaired coagulation (example: Hemophilia, cirrhosis of the liver, low platelet counts, etc.). However, even if you do not have one on these, it can still happen. If you have any of these conditions, or take one of these drugs, make sure to notify your treating physician. Infection: This is more common in patients with a compromised immune system, either due to disease (example: diabetes, cancer, human immunodeficiency virus [HIV], etc.), or due to medications or treatments (example: therapies used to treat cancer and rheumatological diseases). However, even if you do not have one on  these, it can still happen. If you have any of these conditions, or take one of   these drugs, make sure to notify your treating physician. Nerve Damage: This is more common when the treatment is an invasive one, but it can also happen with the use of medications, such as those used in the treatment of cancer. The damage can occur to small secondary nerves, or to large primary ones, such as those in the spinal cord and brain. This damage may be temporary or permanent and it may lead to impairments that can range from temporary numbness to permanent paralysis and/or brain death. Allergic Reactions: Any time a substance or material comes in contact with our body, there is the possibility of an allergic reaction. These can range from a mild skin rash (contact dermatitis) to a severe systemic reaction (anaphylactic reaction), which can result in death. Death: In general, any medical intervention can result in death, most of the time due to an unforeseen complication. ____________________________________________________________________________________________  ______________________________________________________________________________________________  Body mass index (BMI)  Body mass index (BMI) is a common tool for deciding whether a person has an appropriate body weight.  It measures a persons weight in relation to their height.   According to the [facility] of health (NIH): A BMI of less than 18.5 means that a person is underweight. A BMI of between 18.5 and 24.9 is ideal. A BMI of between 25 and 29.9 is overweight. A BMI over 30 indicates obesity.  Weight Management Required  URGENT: Your weight has been found to be adversely affecting your health.  Dear Mr. Hennes:  Your current Estimated body mass index is 52.12 kg/m as calculated from the following:   Height as of this encounter: $RemoveBeforeD'6\' 3"'[id number]$  (1.905 m).   Weight as of this encounter: 417 lb (189.1 kg).  Please use the table below to  identify your weight category and associated incidence of chronic pain, secondary to your weight.  Body Mass Index (BMI) Classification BMI level (kg/m2) Category Associated incidence of chronic pain  <18  Underweight   18.5-24.9 Ideal body weight   25-29.9 Overweight  20%  30-34.9 Obese (Class I)  68%  35-39.9 Severe obesity (Class II)  136%  >40 Extreme obesity (Class III)  254%   In addition: You will be considered "Morbidly Obese", if your BMI is above 30 and you have one or more of the following conditions which are known to be caused and/or directly associated with obesity: 1.    Type 2 Diabetes (Which in turn can lead to cardiovascular diseases (CVD), stroke, peripheral vascular diseases (PVD), retinopathy, nephropathy, and neuropathy) 2.    Cardiovascular Disease (High Blood Pressure; Congestive Heart Failure; High Cholesterol; Coronary Artery Disease; Angina; or History of Heart Attacks) 3.    Breathing problems (Asthma; obesity-hypoventilation syndrome; obstructive sleep apnea; chronic inflammatory airway disease; reactive airway disease; or shortness of breath) 4.    Chronic kidney disease 5.    Liver disease (nonalcoholic fatty liver disease) 6.    High blood pressure 7.    Acid reflux (gastroesophageal reflux disease; heartburn) 8.    Osteoarthritis (OA) (with any of the following: hip pain; knee pain; and/or low back pain) 9.    Low back pain (Lumbar Facet Syndrome; and/or Degenerative Disc Disease) 10.  Hip pain (Osteoarthritis of hip) (For every 1 lbs of added body weight, there is a 2 lbs increase in pressure inside of each hip articulation. 1:2 mechanical relationship) 11.  Knee pain (Osteoarthritis of knee) (For every 1 lbs of added body weight, there is a 4 lbs increase in pressure inside of  each knee articulation. 1:4 mechanical relationship) (patients with a BMI>30 kg/m2 were 6.8 times more likely to develop knee OA than normal-weight individuals) 12.  Cancer:  Epidemiological studies have shown that obesity is a risk factor for: post-menopausal breast cancer; cancers of the endometrium, colon and kidney cancer; malignant adenomas of the oesophagus. Obese subjects have an approximately 1.5-3.5-fold increased risk of developing these cancers compared with normal-weight subjects, and it has been estimated that between 15 and 45% of these cancers can be attributed to overweight. More recent studies suggest that obesity may also increase the risk of other types of cancer, including pancreatic, hepatic and gallbladder cancer. (Ref: Obesity and cancer. Pischon T, Nthlings U, [employer] H. Proc Nutr Soc. 2008 May;67(2):128-45. doi: 16.6060/O4599774142395320.) The International Agency for Research on Cancer (IARC) has identified 13 cancers associated with overweight and obesity: meningioma, multiple myeloma, adenocarcinoma of the esophagus, and cancers of the thyroid, postmenopausal breast cancer, gallbladder, stomach, liver, pancreas, kidney, ovaries, uterus, colon and rectal (colorectal) cancers. 17 percent of all cancers diagnosed in women and 24 percent of those diagnosed in men are associated with overweight and obesity.  Recommendation: At this point it is urgent that you take a step back and concentrate in loosing weight. Dedicate 100% of your efforts on this task. Nothing else will improve your health more than bringing your weight down and your BMI to less than 30. If you are here, you probably have chronic pain. We know that most chronic pain patients have difficulty exercising secondary to their pain. For this reason, you must rely on proper nutrition and diet in order to lose the weight. If your BMI is above 40, you should seriously consider bariatric surgery. A realistic goal is to lose 10% of your body weight over a period of 12 months.  Be honest to yourself, if over time you have unsuccessfully tried to lose weight, then it is time for you to seek professional help  and to enter a medically supervised weight management program, and/or undergo bariatric surgery. Stop procrastinating.   Pain management considerations:  1.    Pharmacological Problems: Be advised that the use of opioid analgesics (oxycodone; hydrocodone; morphine; methadone; codeine; and all of their derivatives) have been associated with decreased metabolism and weight gain.  For this reason, should we see that you are unable to lose weight while taking these medications, it may become necessary for [place] to taper down and indefinitely discontinue them.  2.    Technical Problems: The incidence of successful interventional therapies decreases as the patient's BMI increases. It is much more difficult to accomplish a safe and effective interventional therapy on a patient with a BMI above 35. 3.    Radiation Exposure Problems: The x-rays machine, used to accomplish injection therapies, will automatically increase their x-ray output in order to capture an appropriate bone image. This means that radiation exposure increases exponentially with the patient's BMI. (The higher the BMI, the higher the radiation exposure.) Although the level of radiation used at a given time is still safe to the patient, it is not for the physician and/or assisting staff. Unfortunately, radiation exposure is accumulative. Because physicians and the staff have to do procedures and be exposed on a daily basis, this can result in health problems such as cancer and radiation burns. Radiation exposure to the staff is monitored by the radiation batches that they wear. The exposure levels are reported back to the staff on a quarterly basis. Depending on levels of exposure, physicians and  staff may be obligated by law to decrease this exposure. This means that they have the right and obligation to refuse providing therapies where they may be overexposed to radiation. For this reason, physicians may decline to offer therapies such as radiofrequency  ablation or implants to patients with a BMI above 40. 4.    Current Trends: Be advised that the current trend is to no longer offer certain therapies to patients with a BMI equal to, or above 35, due to increase perioperative risks, increased technical procedural difficulties, and excessive radiation exposure to healthcare personnel.  ______________________________________________________________________________________________   Pain Management Discharge Instructions  General Discharge Instructions :  If you need to reach your doctor call: Monday-Friday 8:00 am - 4:00 pm at [phone] or toll free [phone].  After clinic hours [phone] to have operator reach doctor.  Bring all of your medication bottles to all your appointments in the pain clinic.  To cancel or reschedule your appointment with Pain Management please remember to call 24 hours in advance to avoid a fee.  Refer to the educational materials which you have been given on: General Risks, I had my Procedure. Discharge Instructions, Post Sedation.  Post Procedure Instructions:  The drugs you were given will stay in your system until tomorrow, so for the next 24 hours you should not drive, make any legal decisions or drink any alcoholic beverages.  You may eat anything you prefer, but it is better to start with liquids then soups and crackers, and gradually work up to solid foods.  Please notify your doctor immediately if you have any unusual bleeding, trouble breathing or pain that is not related to your normal pain.  Depending on the type of procedure that was done, some parts of your body may feel week and/or numb.  This usually clears up by tonight or the next day.  Walk with the use of an assistive device or accompanied by an adult for the 24 hours.  You may use ice on the affected area for the first 24 hours.  Put ice in a Ziploc bag and cover with a towel and place against area 15 minutes on 15 minutes off.   You may switch to heat after 24 hours.GENERAL RISKS AND COMPLICATIONS  What are the risk, side effects and possible complications? Generally speaking, most procedures are safe.  However, with any procedure there are risks, side effects, and the possibility of complications.  The risks and complications are dependent upon the sites that are lesioned, or the type of nerve block to be performed.  The closer the procedure is to the spine, the more serious the risks are.  Great care is taken when placing the radio frequency needles, block needles or lesioning probes, but sometimes complications can occur. Infection: Any time there is an injection through the skin, there is a risk of infection.  This is why sterile conditions are used for these blocks.  There are four possible types of infection. Localized skin infection. Central Nervous System Infection-This can be in the form of Meningitis, which can be deadly. Epidural Infections-This can be in the form of an epidural abscess, which can cause pressure inside of the spine, causing compression of the spinal cord with subsequent paralysis. This would require an emergency surgery to decompress, and there are no guarantees that the patient would recover from the paralysis. Discitis-This is an infection of the intervertebral discs.  It occurs in about 1% of discography procedures.  It is difficult to treat and  it may lead to surgery.        2. Pain: the needles have to go through skin and soft tissues, will cause soreness.       3. Damage to internal structures:  The nerves to be lesioned may be near blood vessels or    other nerves which can be potentially damaged.       4. Bleeding: Bleeding is more common if the patient is taking blood thinners such as  aspirin, Coumadin, Ticiid, Plavix, etc., or if he/she have some genetic predisposition  such as hemophilia. Bleeding into the spinal canal can cause compression of the spinal  cord with subsequent paralysis.   This would require an emergency surgery to  decompress and there are no guarantees that the patient would recover from the  paralysis.       5. Pneumothorax:  Puncturing of a lung is a possibility, every time a needle is introduced in  the area of the chest or upper back.  Pneumothorax refers to free air around the  collapsed lung(s), inside of the thoracic cavity (chest cavity).  Another two possible  complications related to a similar event would include: Hemothorax and Chylothorax.   These are variations of the Pneumothorax, where instead of air around the collapsed  lung(s), you may have blood or chyle, respectively.       6. Spinal headaches: They may occur with any procedures in the area of the spine.       7. Persistent CSF (Cerebro-Spinal Fluid) leakage: This is a rare problem, but may occur  with prolonged intrathecal or epidural catheters either due to the formation of a fistulous  track or a dural tear.       8. Nerve damage: By working so close to the spinal cord, there is always a possibility of  nerve damage, which could be as serious as a permanent spinal cord injury with  paralysis.       9. Death:  Although rare, severe deadly allergic reactions known as "Anaphylactic  reaction" can occur to any of the medications used.      10. Worsening of the symptoms:  We can always make thing worse.  What are the chances of something like this happening? Chances of any of this occuring are extremely low.  By statistics, you have more of a chance of getting killed in a motor vehicle accident: while driving to the hospital than any of the above occurring .  Nevertheless, you should be aware that they are possibilities.  In general, it is similar to taking a shower.  Everybody knows that you can slip, hit your head and get killed.  Does that mean that you should not shower again?  Nevertheless always keep in mind that statistics do not mean anything if you happen to be on the wrong side of them.  Even if  a procedure has a 1 (one) in a 1,000,000 (million) chance of going wrong, it you happen to be that one..Also, keep in mind that by statistics, you have more of a chance of having something go wrong when taking medications.  Who should not have this procedure? If you are on a blood thinning medication (e.g. Coumadin, Plavix, see list of "Blood Thinners"), or if you have an active infection going on, you should not have the procedure.  If you are taking any blood thinners, please inform your physician.  How should I prepare for this procedure? Do not eat or drink anything at  least six hours prior to the procedure. Bring a driver with you .  It cannot be a taxi. Come accompanied by an adult that can drive you back, and that is strong enough to help you if your legs get weak or numb from the local anesthetic. Take all of your medicines the morning of the procedure with just enough water to swallow them. If you have diabetes, make sure that you are scheduled to have your procedure done first thing in the morning, whenever possible. If you have diabetes, take only half of your insulin dose and notify our nurse that you have done so as soon as you arrive at the clinic. If you are diabetic, but only take blood sugar pills (oral hypoglycemic), then do not take them on the morning of your procedure.  You may take them after you have had the procedure. Do not take aspirin or any aspirin-containing medications, at least eleven (11) days prior to the procedure.  They may prolong bleeding. Wear loose fitting clothing that may be easy to take off and that you would not mind if it got stained with Betadine or blood. Do not wear any jewelry or perfume Remove any nail coloring.  It will interfere with some of our monitoring equipment.  NOTE: Remember that this is not meant to be interpreted as a complete list of all possible complications.  Unforeseen problems may occur.  BLOOD THINNERS The following drugs  contain aspirin or other products, which can cause increased bleeding during surgery and should not be taken for 2 weeks prior to and 1 week after surgery.  If you should need take something for relief of minor pain, you may take acetaminophen which is found in Tylenol,m Datril, Anacin-3 and Panadol. It is not blood thinner. The products listed below are.  Do not take any of the products listed below in addition to any listed on your instruction sheet.  A.P.C or A.P.C with Codeine Codeine Phosphate Capsules #3 Ibuprofen Ridaura  ABC compound Congesprin Imuran rimadil  Advil Cope Indocin Robaxisal  Alka-Seltzer Effervescent Pain Reliever and Antacid Coricidin or Coricidin-D  Indomethacin Rufen  Alka-Seltzer plus [facility] Cosprin Ketoprofen S-A-C Tablets  Anacin Analgesic Tablets or Capsules Coumadin Korlgesic Salflex  Anacin Extra Strength Analgesic tablets or capsules CP-2 Tablets Lanoril Salicylate  Anaprox Cuprimine Capsules Levenox Salocol  Anexsia-D Dalteparin Magan Salsalate  Anodynos Darvon compound Magnesium Salicylate Sine-off  Ansaid Dasin Capsules Magsal Sodium Salicylate  Anturane Depen Capsules Marnal Soma  APF Arthritis pain formula Dewitt's Pills Measurin Stanback  Argesic Dia-Gesic Meclofenamic Sulfinpyrazone  Arthritis Bayer Timed Release Aspirin Diclofenac Meclomen Sulindac  Arthritis pain formula Anacin Dicumarol Medipren Supac  Analgesic (Safety coated) Arthralgen Diffunasal Mefanamic Suprofen  Arthritis Strength Bufferin Dihydrocodeine Mepro Compound Suprol  Arthropan liquid Dopirydamole Methcarbomol with Aspirin Synalgos  ASA tablets/Enseals Disalcid Micrainin Tagament  Ascriptin Doan's Midol Talwin  Ascriptin A/D Dolene Mobidin Tanderil  Ascriptin Extra Strength Dolobid Moblgesic Ticlid  Ascriptin with Codeine Doloprin or Doloprin with Codeine Momentum Tolectin  Asperbuf Duoprin Mono-gesic Trendar  Aspergum Duradyne Motrin or Motrin IB Triminicin  Aspirin  plain, buffered or enteric coated Durasal Myochrisine Trigesic  Aspirin Suppositories Easprin Nalfon Trillsate  Aspirin with Codeine Ecotrin Regular or Extra Strength Naprosyn Uracel  Atromid-S Efficin Naproxen Ursinus  Auranofin Capsules Elmiron Neocylate Vanquish  Axotal Emagrin Norgesic Verin  Azathioprine Empirin or Empirin with Codeine Normiflo Vitamin E  Azolid Emprazil Nuprin Voltaren  Bayer Aspirin plain, buffered or children's or timed BC Tablets or  powders Encaprin Orgaran Warfarin Sodium  Buff-a-Comp Enoxaparin Orudis Zorpin  Buff-a-Comp with Codeine Equegesic Os-Cal-Gesic   Buffaprin Excedrin plain, buffered or Extra Strength Oxalid   Bufferin Arthritis Strength Feldene Oxphenbutazone   Bufferin plain or Extra Strength Feldene Capsules Oxycodone with Aspirin   Bufferin with Codeine Fenoprofen Fenoprofen Pabalate or Pabalate-SF   Buffets II Flogesic Panagesic   Buffinol plain or Extra Strength Florinal or Florinal with Codeine Panwarfarin   Buf-Tabs Flurbiprofen Penicillamine   Butalbital Compound Four-way cold tablets Penicillin   Butazolidin Fragmin Pepto-Bismol   Carbenicillin Geminisyn Percodan   Carna Arthritis Reliever Geopen Persantine   Carprofen Gold's salt Persistin   Chloramphenicol Goody's Phenylbutazone   Chloromycetin Haltrain Piroxlcam   Clmetidine heparin Plaquenil   Cllnoril Hyco-pap Ponstel   Clofibrate Hydroxy chloroquine Propoxyphen         Before stopping any of these medications, be sure to consult the physician who ordered them.  Some, such as Coumadin (Warfarin) are ordered to prevent or treat serious conditions such as "deep thrombosis", "pumonary embolisms", and other heart problems.  The amount of time that you may need off of the medication may also vary with the medication and the reason for which you were taking it.  If you are taking any of these medications, please make sure you notify your pain physician before you undergo any  procedures.         Facet Blocks Patient Information  Description: The facets are joints in the spine between the vertebrae.  Like any joints in the body, facets can become irritated and painful.  Arthritis can also effect the facets.  By injecting steroids and local anesthetic in and around these joints, we can temporarily block the nerve supply to them.  Steroids act directly on irritated nerves and tissues to reduce selling and inflammation which often leads to decreased pain.  Facet blocks may be done anywhere along the spine from the neck to the low back depending upon the location of your pain.   After numbing the skin with local anesthetic (like Novocaine), a small needle is passed onto the facet joints under x-ray guidance.  You may experience a sensation of pressure while this is being done.  The entire block usually lasts about 15-25 minutes.   Conditions which may be treated by facet blocks:  Low back/buttock pain Neck/shoulder pain Certain types of headaches  Preparation for the injection:  Do not eat any solid food or dairy products within 8 hours of your appointment. You may drink clear liquid up to 3 hours before appointment.  Clear liquids include water, black coffee, juice or soda.  No milk or cream please. You may take your regular medication, including pain medications, with a sip of water before your appointment.  Diabetics should hold regular insulin (if taken separately) and take 1/2 normal NPH dose the morning of the procedure.  Carry some sugar containing items with you to your appointment. A driver must accompany you and be prepared to drive you home after your procedure. Bring all your current medications with you. An IV may be inserted and sedation may be given at the discretion of the physician. A blood pressure cuff, EKG and other monitors will often be applied during the procedure.  Some patients may need to have extra oxygen administered for a short  period. You will be asked to provide medical information, including your allergies and medications, prior to the procedure.  We must know immediately if you are taking blood thinners (  like Coumadin/Warfarin) or if you are allergic to IV iodine contrast (dye).  We must know if you could possible be pregnant.  Possible side-effects:  Bleeding from needle site Infection (rare, may require surgery) Nerve injury (rare) Numbness & tingling (temporary) Difficulty urinating (rare, temporary) Spinal headache (a headache worse with upright posture) Light-headedness (temporary) Pain at injection site (serveral days) Decreased blood pressure (rare, temporary) Weakness in arm/leg (temporary) Pressure sensation in back/neck (temporary)   Call if you experience:  Fever/chills associated with headache or increased back/neck pain Headache worsened by an upright position New onset, weakness or numbness of an extremity below the injection site Hives or difficulty breathing (go to the emergency room) Inflammation or drainage at the injection site(s) Severe back/neck pain greater than usual New symptoms which are concerning to you  Please note:  Although the local anesthetic injected can often make your back or neck feel good for several hours after the injection, the pain will likely return. It takes 3-7 days for steroids to work.  You may not notice any pain relief for at least one week.  If effective, we will often do a series of 2-3 injections spaced 3-6 weeks apart to maximally decrease your pain.  After the initial series, you may be a candidate for a more permanent nerve block of the facets.  If you have any questions, please call #[phone]) [facility]

## 2020-10-31 NOTE — Progress Notes (Signed)
Safety precautions to be maintained throughout the outpatient stay will include: orient to surroundings, keep bed in low position, maintain call bell within reach at all times, provide assistance with transfer out of bed and ambulation.  

## 2020-11-07 ENCOUNTER — Telehealth: Payer: Self-pay

## 2020-11-07 NOTE — Telephone Encounter (Signed)
IN order to authorized the lumbar facets, Insurance wants documentation of physician directed home exercise program or PT. There is no PT in the chart so please put a note that states you have directed him to do home exercises.

## 2020-11-11 ENCOUNTER — Telehealth: Payer: Self-pay | Admitting: Cardiovascular Disease

## 2020-11-11 NOTE — Telephone Encounter (Signed)
Patient cancelled upcoming visits.  Seeing new provider Dr. Humphrey Rolls.

## 2020-11-14 ENCOUNTER — Ambulatory Visit: Payer: Medicaid Other | Admitting: Cardiovascular Disease

## 2020-11-14 NOTE — Progress Notes (Signed)
PROVIDER NOTE: Information contained herein reflects review and annotations entered in association with encounter. Interpretation of such information and data should be left to medically-trained personnel. Information provided to patient can be located elsewhere in the medical record under "Patient Instructions". Document created using STT-dictation technology, any transcriptional errors that may result from process are unintentional.    Patient: Gary [name]  Service Category: Procedure  Provider: Gaspar Cola, MD  DOB: 09/18/1955  DOS: 11/15/2020  Location: [facility]  MRN: [id number]  Setting: Ambulatory - outpatient  Referring Provider: Milinda Pointer, MD  Type: Established Patient  Specialty: Interventional Pain Management  PCP: [facility]   Primary Reason for Visit: Interventional Pain Management Treatment. CC: Back Pain (lower)  Procedure:          Anesthesia, Analgesia, Anxiolysis:  Type: Lumbar Facet, Medial Branch Block(s) #1  Primary Purpose: Diagnostic Region: Posterolateral Lumbosacral Spine Level: L2, L3, L4, L5, & S1 Medial Branch Level(s). Injecting these levels blocks the L3-4, L4-5, and L5-S1 lumbar facet joints. Laterality: Bilateral  Type: Minimal (Conscious) Anxiolysis combined with Local Anesthesia Indication(s): Analgesia and Anxiety Route: Intravenous (IV) IV Access: Secured Sedation: Meaningful verbal contact was maintained at all times during the procedure  Local Anesthetic: Lidocaine 1-2%  Position: Prone   Indications: 1. Lumbosacral facet joint syndrome (Bilateral)   2. Lumbar Grade 1 Anterolisthesis of L4 over L5 (3 mm) (stable)   3. Spondylosis without myelopathy or radiculopathy, lumbosacral region   4. Lumbar facet osteoarthritis (Multilevel) (Bilateral)   5. Lumbar facet arthropathy   6. DDD (degenerative disc disease), lumbosacral   7. Chronic low back pain (1ry area of Pain) (Bilateral) (L>R) w/o  sciatica   8. 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  He also provided [place] with the telephone number for this driver.  After the procedure was completed, the patient was taking down stairs to assist him in getting into his vehicle and it turns out that he had light was about driver.  All the telephone numbers that he gave [place] were not accurate.  The patient completely disregarded safety protocols and he drove himself home while under the influence of Versed.  The nursing staff to get a picture of the patient getting into his vehicle and showed it to me.  Pre-op H&P Assessment:  Gary [name] is a 65 y.o. (year old), male patient, seen today for interventional treatment. He  has a past surgical history that includes Partial colectomy and Colonoscopy with propofol (N/A, 06/04/2018). Gary [name] has a current medication list which includes the following prescription(s): acetaminophen, aspirin, carvedilol, ipratropium-albuterol, pantoprazole, phentermine, simvastatin, spironolactone, umeclidinium-vilanterol, losartan, and torsemide, and the following Facility-Administered Medications: midazolam. His primarily concern today is the Back Pain (lower)  Initial Vital Signs:  Pulse/HCG Rate: 76ECG Heart Rate: 75 Temp: (!) 96.8 F (36 C) Resp: 16 BP: 113/88 SpO2: 94 %  BMI: Estimated body mass index is 48.75 kg/m as calculated from the following:   Height as of this encounter: 6\' 3"  (1.905 m).   Weight as of this encounter: 390 lb (176.9 kg).  Risk Assessment: Allergies: Reviewed. He has No Known Allergies.  Allergy Precautions: None required Coagulopathies: Reviewed. None identified.  Blood-thinner therapy: None at this time Active Infection(s): Reviewed. None identified.  Gary [name] is  afebrile  Site Confirmation: Gary [name] was asked to confirm the procedure and laterality before marking the site Procedure checklist: Completed Consent: Before the procedure and under the influence of no sedative(s), amnesic(s), or anxiolytics, the patient was informed of the treatment options, risks and possible complications. To fulfill our ethical and legal obligations, as recommended by the American Medical Association's Code of Ethics, I have informed the patient of my clinical impression; the nature and purpose of the treatment or procedure; the risks, benefits, and possible complications of the intervention; the alternatives, including doing nothing; the risk(s) and benefit(s) of the alternative treatment(s) or procedure(s); and the risk(s) and benefit(s) of doing nothing. The patient was provided information about the general risks and possible complications associated with the procedure. These may include, but are not limited to: failure to achieve desired goals, infection, bleeding, organ or nerve damage, allergic reactions, paralysis, and death. In addition, the patient was informed of those risks and complications associated to Spine-related procedures, such as failure to decrease pain; infection (i.e.: Meningitis, epidural or intraspinal abscess); bleeding (i.e.: epidural hematoma, subarachnoid hemorrhage, or any other type of intraspinal or peri-dural bleeding); organ or nerve damage (i.e.: Any type of peripheral nerve, nerve root, or spinal cord injury) with subsequent damage to sensory, motor, and/or autonomic systems, resulting in permanent pain, numbness, and/or weakness of one or several areas of the body; allergic reactions; (i.e.: anaphylactic reaction); and/or death. Furthermore, the patient was informed of those risks and complications associated with the medications. These include, but are not limited to: allergic reactions (i.e.: anaphylactic or anaphylactoid reaction(s)); adrenal  axis suppression; blood sugar elevation that in diabetics may result in ketoacidosis or comma; water retention that in patients with history of congestive heart failure may result in shortness of breath, pulmonary edema, and decompensation with resultant heart failure; weight gain; swelling or edema; medication-induced neural toxicity; particulate matter embolism and blood vessel occlusion with resultant organ, and/or nervous system infarction; and/or aseptic necrosis of one or more joints. Finally, the patient was informed that Medicine is not an exact science; therefore, there is also the possibility of unforeseen or unpredictable risks and/or possible complications that may result in a catastrophic outcome. The patient indicated having understood very clearly. We have given the patient no guarantees and we have made no promises. Enough time was given to the patient to ask questions, all of which were answered to the patient's satisfaction. Mr. Chenault has indicated that he wanted to continue with the procedure. Attestation: I, the ordering provider, attest that I have discussed with the patient the benefits, risks, side-effects, alternatives, likelihood of achieving goals, and potential problems during recovery for the procedure that I have provided informed consent. Date  Time: 11/15/2020  8:52 AM  Pre-Procedure Preparation:  Monitoring: As per clinic protocol. Respiration, ETCO2, SpO2, BP, heart rate and rhythm monitor placed and checked for adequate function Safety Precautions: Patient was assessed for positional comfort and pressure points before starting the procedure. Time-out: I initiated and conducted the "Time-out" before starting the procedure, as per protocol. The patient was asked to participate by confirming the accuracy of the "Time Out" information. Verification of the correct person, site, and procedure were performed and confirmed by me, the nursing staff, and the patient. "Time-out"  conducted as per Joint Commission's Universal Protocol (UP.01.01.01). Time: 0920  Description of Procedure:          Laterality: Bilateral. The procedure was performed in identical fashion on both sides. Levels:  L2, L3, L4, L5, & S1 Medial Branch Level(s) Area Prepped: Posterior Lumbosacral Region DuraPrep (Iodine Povacrylex [0.7% available iodine] and Isopropyl Alcohol, 74% w/w) Safety Precautions: Aspiration looking for blood return was conducted prior to all injections. At no point did we inject any substances, as a needle was being advanced. Before injecting, the patient was told to immediately notify me if he was experiencing any new onset of "ringing in the ears, or metallic taste in the mouth". No attempts were made at seeking any paresthesias. Safe injection practices and needle disposal techniques used. Medications properly checked for expiration dates. SDV (single dose vial) medications used. After the completion of the procedure, all disposable equipment used was discarded in the proper designated medical waste containers. Local Anesthesia: Protocol guidelines were followed. The patient was positioned over the fluoroscopy table. The area was prepped in the usual manner. The time-out was completed. The target area was identified using fluoroscopy. A 12-in long, straight, sterile hemostat was used with fluoroscopic guidance to locate the targets for each level blocked. Once located, the skin was marked with an approved surgical skin marker. Once all sites were marked, the skin (epidermis, dermis, and hypodermis), as well as deeper tissues (fat, connective tissue and muscle) were infiltrated with a small amount of a short-acting local anesthetic, loaded on a 10cc syringe with a 25G, 1.5-in  Needle. An appropriate amount of time was allowed for local anesthetics to take effect before proceeding to the next step. Local Anesthetic: Lidocaine 2.0% The unused portion of the local anesthetic was  discarded in the proper designated containers. Technical explanation of process:  L2 Medial Branch Nerve Block (MBB): The target area for the L2 medial branch is at the junction of the postero-lateral aspect of the superior articular process and the superior, posterior, and medial edge of the transverse process of L3. Under fluoroscopic guidance, a Quincke needle was inserted until contact was made with os over the superior postero-lateral aspect of the pedicular shadow (target area). After negative aspiration for blood, 0.5 mL of the nerve block solution was injected without difficulty or complication. The needle was removed intact. L3 Medial Branch Nerve Block (MBB): The target area for the L3 medial branch is at the junction of the postero-lateral aspect of the superior articular process and the superior, posterior, and medial edge of the transverse process of L4. Under fluoroscopic guidance, a Quincke needle was inserted until contact was made with os over the superior postero-lateral aspect of the pedicular shadow (target area). After negative aspiration for blood, 0.5 mL of the nerve block solution was injected without difficulty or complication. The needle was removed intact. L4 Medial Branch Nerve Block (MBB): The target area for the L4 medial branch is at the junction of the postero-lateral aspect of the superior articular process and the superior, posterior, and medial edge of the transverse process of L5. Under fluoroscopic guidance, a Quincke needle was inserted until contact was made with os over the superior postero-lateral aspect of the pedicular shadow (target area). After negative aspiration for blood, 0.5 mL of the nerve block solution was injected without difficulty or complication. The needle was removed intact. L5 Medial Branch Nerve Block (MBB): The target area for the L5 medial branch is at the junction of the postero-lateral aspect of the superior articular process and the superior,  posterior, and medial edge of the sacral ala. Under fluoroscopic guidance, a Quincke needle was inserted until contact was made with os over the superior postero-lateral aspect of  the pedicular shadow (target area). After negative aspiration for blood, 0.5 mL of the nerve block solution was injected without difficulty or complication. The needle was removed intact. S1 Medial Branch Nerve Block (MBB): The target area for the S1 medial branch is at the posterior and inferior 6 o'clock position of the L5-S1 facet joint. Under fluoroscopic guidance, the Quincke needle inserted for the L5 MBB was redirected until contact was made with os over the inferior and postero aspect of the sacrum, at the 6 o' clock position under the L5-S1 facet joint (Target area). After negative aspiration for blood, 0.5 mL of the nerve block solution was injected without difficulty or complication. The needle was removed intact.  Nerve block solution: 0.2% PF-Ropivacaine + Triamcinolone (40 mg/mL) diluted to a final concentration of 4 mg of Triamcinolone/mL of Ropivacaine The unused portion of the solution was discarded in the proper designated containers. Procedural Needles: 22-gauge, 7-inch, Quincke needles used for all levels.  Once the entire procedure was completed, the treated area was cleaned, making sure to leave some of the prepping solution back to take advantage of its long term bactericidal properties.      Illustration of the posterior view of the lumbar spine and the posterior neural structures. Laminae of L2 through S1 are labeled. DPRL5, dorsal primary ramus of L5; DPRS1, dorsal primary ramus of S1; DPR3, dorsal primary ramus of L3; FJ, facet (zygapophyseal) joint L3-L4; I, inferior articular process of L4; LB1, lateral branch of dorsal primary ramus of L1; IAB, inferior articular branches from L3 medial branch (supplies L4-L5 facet joint); IBP, intermediate branch plexus; MB3, medial branch of dorsal primary ramus  of L3; NR3, third lumbar nerve root; S, superior articular process of L5; SAB, superior articular branches from L4 (supplies L4-5 facet joint also); TP3, transverse process of L3.  Vitals:   11/15/20 0931 11/15/20 0933 11/15/20 0941 11/15/20 0951  BP: (!) 111/92 (!) 115/98 116/89 127/87  Pulse:      Resp: (!) 29 15 (!) 30 16  Temp:   (!) 96.8 F (36 C)   TempSrc:      SpO2: 95% 95% 92% 95%  Weight:      Height:         Start Time: 0920 hrs. End Time: 0933 hrs.  Imaging Guidance (Spinal):          Type of Imaging Technique: Fluoroscopy Guidance (Spinal) Indication(s): Assistance in needle guidance and placement for procedures requiring needle placement in or near specific anatomical locations not easily accessible without such assistance. Exposure Time: Please see nurses notes. Contrast: None used. Fluoroscopic Guidance: I was personally present during the use of fluoroscopy. "Tunnel Vision Technique" used to obtain the best possible view of the target area. Parallax error corrected before commencing the procedure. "Direction-depth-direction" technique used to introduce the needle under continuous pulsed fluoroscopy. Once target was reached, antero-posterior, oblique, and lateral fluoroscopic projection used confirm needle placement in all planes. Images permanently stored in EMR. Interpretation: No contrast injected. I personally interpreted the imaging intraoperatively. Adequate needle placement confirmed in multiple planes. Permanent images saved into the patient's record.  Antibiotic Prophylaxis:   Anti-infectives (From admission, onward)    None      Indication(s): None identified  Post-operative Assessment:  Post-procedure Vital Signs:  Pulse/HCG Rate: 7671 Temp: (!) 96.8 F (36 C) Resp: 16 BP: 127/87 SpO2: 95 %  EBL: None  Complications: No immediate post-treatment complications observed by team, or reported by patient.  Note: The patient  tolerated the entire  procedure well. A repeat set of vitals were taken after the procedure and the patient was kept under observation following institutional policy, for this type of procedure. Post-procedural neurological assessment was performed, showing return to baseline, prior to discharge. The patient was provided with post-procedure discharge instructions, including a section on how to identify potential problems. Should any problems arise concerning this procedure, the patient was given instructions to immediately contact us, at any time, without hesitation. In any case, we plan to contact the patient by telephone for a follow-up status report regarding this interventional procedure.  Comments:  No additional relevant information.  Plan of Care  Orders:  Orders Placed This Encounter  Procedures   LUMBAR FACET(MEDIAL BRANCH NERVE BLOCK) MBNB    Scheduling Instructions:     Procedure: Lumbar facet block (AKA.: Lumbosacral medial branch nerve block)     Side: Bilateral     Level: L3-4, L4-5, & L5-S1 Facets (L2, L3, L4, L5, & S1 Medial Branch Nerves)     Sedation: Patient's choice.     Timeframe: Today    Order Specific Question:   Where will this procedure be performed?    Answer:   [facility]   DG PAIN [facility] C-ARM 1-60 MIN NO REPORT    Intraoperative interpretation by procedural physician at [place].    Standing Status:   Standing    Number of Occurrences:   1    Order Specific Question:   Reason for exam:    Answer:   Assistance in needle guidance and placement for procedures requiring needle placement in or near specific anatomical locations not easily accessible without such assistance.   Informed Consent Details: Physician/Practitioner Attestation; Transcribe to consent form and obtain patient signature    Nursing Order: Transcribe to consent form and obtain patient signature. Note: Always confirm laterality of pain with Mr. Millon, before procedure.    Order Specific  Question:   Physician/Practitioner attestation of informed consent for procedure/surgical case    Answer:   I, the physician/practitioner, attest that I have discussed with the patient the benefits, risks, side effects, alternatives, likelihood of achieving goals and potential problems during recovery for the procedure that I have provided informed consent.    Order Specific Question:   Procedure    Answer:   Lumbar Facet Block  under fluoroscopic guidance    Order Specific Question:   Physician/Practitioner performing the procedure    Answer:   Kambre Messner A. Dossie Arbour MD    Order Specific Question:   Indication/Reason    Answer:   Low Back Pain, with our without leg pain, due to Facet Joint Arthralgia (Joint Pain) Spondylosis (Arthritis of the Spine), without myelopathy or radiculopathy (Nerve Damage).   Provide equipment / supplies at bedside    "Block Tray" (Disposable  single use) Needle type: SpinalSpinal Amount/quantity: 4 Size: Long (7-inch) Gauge: 22G    Standing Status:   Standing    Number of Occurrences:   1    Order Specific Question:   Specify    Answer:   Block Tray    Chronic Opioid Analgesic:  None Highest recorded MME/day: 0 mg/day MME/day: 0 mg/day   Medications ordered for procedure: Meds ordered this encounter  Medications   lidocaine (XYLOCAINE) 2 % (with pres) injection 400 mg   lactated ringers infusion 1,000 mL   midazolam (VERSED) 5 MG/5ML injection 1-2 mg    Make sure Flumazenil is available in the pyxis when using this  medication. If oversedation occurs, administer 0.2 mg IV over 15 sec. If after 45 sec no response, administer 0.2 mg again over 1 min; may repeat at 1 min intervals; not to exceed 4 doses (1 mg)   ropivacaine (PF) 2 mg/mL (0.2%) (NAROPIN) injection 18 mL   triamcinolone acetonide (KENALOG-40) injection 80 mg    Medications administered: We administered lidocaine, lactated ringers, midazolam, ropivacaine (PF) 2 mg/mL (0.2%), and triamcinolone  acetonide.  See the medical record for exact dosing, route, and time of administration.  Follow-up plan:   No follow-ups on file.      Interventional Therapies  Risk  Complexity Considerations:   Estimated body mass index is 52.12 kg/m as calculated from the following:   Height as of this encounter: 6\' 3"  (1.905 m).   Weight as of this encounter: 417 lb (189.1 kg). WNL   Planned  Pending:   Pending further evaluation   Under consideration:   Diagnostic bilateral lumbar facet block  Diagnostic bilateral shoulder joint injection  Diagnostic bilateral suprascapular nerve block  Diagnostic bilateral intra-articular knee joint injection  Possible bilateral Hyalgan knee injections  Diagnostic bilateral intra-articular hip joint injection  Possible diagnostic bilateral median nerve block (carpal tunnel injection)    Completed:   None at this time   Therapeutic  Palliative (PRN) options:   None established     Recent Visits Date Type Provider Dept  10/31/20 Office Visit Milinda Pointer, MD [facility]  Showing recent visits within past 90 days and meeting all other requirements Today's Visits Date Type Provider Dept  11/15/20 Procedure visit Milinda Pointer, MD [facility]  Showing today's visits and meeting all other requirements Future Appointments No visits were found meeting these conditions. Showing future appointments within next 90 days and meeting all other requirements Disposition: Discharge home  Discharge (Date  Time): 11/15/2020;  (Unable to reach patients driver.  Patient placed in lobby and instructed that he could not drive today due to sedation.  Instructed not to leave or get out of the wheelchair without a nurse.  [place] desk staff informed of situation.) hrs.   Primary Care Physician: [facility], [place] Location: [facility] Note by: Gaspar Cola, MD Date: 11/15/2020; Time: 10:43  AM  Disclaimer:  Medicine is not an Chief Strategy Officer. The only guarantee in medicine is that nothing is guaranteed. It is important to note that the decision to proceed with this intervention was based on the information collected from the patient. The Data and conclusions were drawn from the patient's questionnaire, the interview, and the physical examination. Because the information was provided in large part by the patient, it cannot be guaranteed that it has not been purposely or unconsciously manipulated. Every effort has been made to obtain as much relevant data as possible for this evaluation. It is important to note that the conclusions that lead to this procedure are derived in large part from the available data. Always take into account that the treatment will also be dependent on availability of resources and existing treatment guidelines, considered by other Pain Management Practitioners as being common knowledge and practice, at the time of the intervention. For Medico-Legal purposes, it is also important to point out that variation in procedural techniques and pharmacological choices are the acceptable norm. The indications, contraindications, technique, and results of the above procedure should only be interpreted and judged by a Board-Certified Interventional Pain Specialist with extensive familiarity and expertise in the same exact procedure  and technique.

## 2020-11-15 ENCOUNTER — Encounter: Payer: Self-pay | Admitting: Pain Medicine

## 2020-11-15 ENCOUNTER — Other Ambulatory Visit: Payer: Self-pay

## 2020-11-15 ENCOUNTER — Ambulatory Visit
Admission: RE | Admit: 2020-11-15 | Discharge: 2020-11-15 | Disposition: A | Discharge status: Home / Self Care | Payer: Medicaid Other | Source: Ambulatory Visit | Attending: Pain Medicine | Admitting: Pain Medicine

## 2020-11-15 ENCOUNTER — Ambulatory Visit (HOSPITAL_BASED_OUTPATIENT_CLINIC_OR_DEPARTMENT_OTHER): Payer: Medicaid Other | Admitting: Pain Medicine

## 2020-11-15 ENCOUNTER — Telehealth: Payer: Self-pay

## 2020-11-15 VITALS — BP 127/87 | HR 76 | Temp 96.8°F | Resp 16 | Ht 75.0 in | Wt 390.0 lb

## 2020-11-15 DIAGNOSIS — M47816 Spondylosis without myelopathy or radiculopathy, lumbar region: Secondary | ICD-10-CM

## 2020-11-15 DIAGNOSIS — M47817 Spondylosis without myelopathy or radiculopathy, lumbosacral region: Secondary | ICD-10-CM | POA: Diagnosis present

## 2020-11-15 DIAGNOSIS — Z6841 Body Mass Index (BMI) 40.0 and over, adult: Secondary | ICD-10-CM | POA: Insufficient documentation

## 2020-11-15 DIAGNOSIS — M4316 Spondylolisthesis, lumbar region: Secondary | ICD-10-CM

## 2020-11-15 DIAGNOSIS — M5137 Other intervertebral disc degeneration, lumbosacral region: Secondary | ICD-10-CM | POA: Diagnosis present

## 2020-11-15 DIAGNOSIS — G8929 Other chronic pain: Secondary | ICD-10-CM | POA: Insufficient documentation

## 2020-11-15 DIAGNOSIS — M545 Low back pain, unspecified: Secondary | ICD-10-CM | POA: Insufficient documentation

## 2020-11-15 DIAGNOSIS — E662 Morbid (severe) obesity with alveolar hypoventilation: Secondary | ICD-10-CM | POA: Insufficient documentation

## 2020-11-15 MED ORDER — MIDAZOLAM HCL 5 MG/5ML IJ SOLN
1.0000 mg | INTRAMUSCULAR | Status: DC | PRN
  Administered 2020-11-15: 2 mg via INTRAVENOUS

## 2020-11-15 MED ORDER — LIDOCAINE HCL (PF) 2 % IJ SOLN
INTRAMUSCULAR | Status: AC
  Filled 2020-11-15: qty 20

## 2020-11-15 MED ORDER — ROPIVACAINE HCL 2 MG/ML IJ SOLN
INTRAMUSCULAR | Status: AC
  Filled 2020-11-15: qty 10

## 2020-11-15 MED ORDER — TRIAMCINOLONE ACETONIDE 40 MG/ML IJ SUSP
80.0000 mg | Freq: Once | INTRAMUSCULAR | Status: AC
  Administered 2020-11-15: 80 mg

## 2020-11-15 MED ORDER — MIDAZOLAM HCL 5 MG/5ML IJ SOLN
INTRAMUSCULAR | Status: AC
  Filled 2020-11-15: qty 5

## 2020-11-15 MED ORDER — LACTATED RINGERS IV SOLN
1000.0000 mL | Freq: Once | INTRAVENOUS | Status: AC
  Administered 2020-11-15: 1000 mL via INTRAVENOUS

## 2020-11-15 MED ORDER — LIDOCAINE HCL 2 % IJ SOLN
20.0000 mL | Freq: Once | INTRAMUSCULAR | Status: AC
  Administered 2020-11-15: 400 mg

## 2020-11-15 MED ORDER — ROPIVACAINE HCL 2 MG/ML IJ SOLN
18.0000 mL | Freq: Once | INTRAMUSCULAR | Status: AC
  Administered 2020-11-15: 18 mL via PERINEURAL

## 2020-11-15 MED ORDER — TRIAMCINOLONE ACETONIDE 40 MG/ML IJ SUSP
INTRAMUSCULAR | Status: AC
  Filled 2020-11-15: qty 2

## 2020-11-15 NOTE — Progress Notes (Signed)
Safety precautions to be maintained throughout the outpatient stay will include: orient to surroundings, keep bed in low position, maintain call bell within reach at all times, provide assistance with transfer out of bed and ambulation.  Patient was placed in the office lobby because we could not reach a driver for him.  Every number he gave me was the wrong number.  I called all of the emergency contact numbers and could not reach anyone.  Patient was instructed that he could naot drive for 24 hours due to the sedation and to wait in the waiting room until his ride arrived.  Informed front desk staff to keep an eye on the patient. Several minutes later, the patient got up from the wheelchair and walked out.  Larene [place] followed him out reminding him that he was under sedation and it was against the law to drive.  Patient states he wasn't gonna wait here all day.  Patient got into a silver [place] SUV and drove away.  Jocelyn Lamer, Dr Dossie Arbour and Dr Holley Raring notified and patient was discharged from the clinic.   Will call patient to see if he made it home safely and inform him that he has been discharged from the clinic for not following instructions and for not being truthful.  Called patient and he states he made it home.  Notified him again that it is illegal to drive while under sedation.  Informed patient that he was discharged from the clinic and his future appointment was cancelled.

## 2020-11-15 NOTE — Patient Instructions (Addendum)
____________________________________________________________________________________________  Post-Procedure Discharge Instructions  Instructions:  Apply ice:   Purpose: This will minimize any swelling and discomfort after procedure.   When: Day of procedure, as soon as you get home.  How: Fill a plastic sandwich bag with crushed ice. Cover it with a small towel and apply to injection site.  How long: (15 min on, 15 min off) Apply for 15 minutes then remove x 15 minutes.  Repeat sequence on day of procedure, until you go to bed.  Apply heat:   Purpose: To treat any soreness and discomfort from the procedure.  When: Starting the next day after the procedure.  How: Apply heat to procedure site starting the day following the procedure.  How long: May continue to repeat daily, until discomfort goes away.  Food intake: Start with clear liquids (like water) and advance to regular food, as tolerated.   Physical activities: Keep activities to a minimum for the first 8 hours after the procedure. After that, then as tolerated.  Driving: If you have received any sedation, be responsible and do not drive. You are not allowed to drive for 24 hours after having sedation.  Blood thinner: (Applies only to those taking blood thinners) You may restart your blood thinner 6 hours after your procedure.  Insulin: (Applies only to Diabetic patients taking insulin) As soon as you can eat, you may resume your normal dosing schedule.  Infection prevention: Keep procedure site clean and dry. Shower daily and clean area with soap and water.  Post-procedure Pain Diary: Extremely important that this be done correctly and accurately. Recorded information will be used to determine the next step in treatment. For the purpose of accuracy, follow these rules:  Evaluate only the area treated. Do not report or include pain from an untreated area. For the purpose of this evaluation, ignore all other areas of pain,  except for the treated area.  After your procedure, avoid taking a long nap and attempting to complete the pain diary after you wake up. Instead, set your alarm clock to go off every hour, on the hour, for the initial 8 hours after the procedure. Document the duration of the numbing medicine, and the relief you are getting from it.  Do not go to sleep and attempt to complete it later. It will not be accurate. If you received sedation, it is likely that you were given a medication that may cause amnesia. Because of this, completing the diary at a later time may cause the information to be inaccurate. This information is needed to plan your care.  Follow-up appointment: Keep your post-procedure follow-up evaluation appointment after the procedure (usually 2 weeks for most procedures, 6 weeks for radiofrequencies). DO NOT FORGET to bring you pain diary with you.   Expect: (What should I expect to see with my procedure?)  From numbing medicine (AKA: Local Anesthetics): Numbness or decrease in pain. You may also experience some weakness, which if present, could last for the duration of the local anesthetic.  Onset: Full effect within 15 minutes of injected.  Duration: It will depend on the type of local anesthetic used. On the average, 1 to 8 hours.   From steroids (Applies only if steroids were used): Decrease in swelling or inflammation. Once inflammation is improved, relief of the pain will follow.  Onset of benefits: Depends on the amount of swelling present. The more swelling, the longer it will take for the benefits to be seen. In some cases, up to 10 days.    Duration: Steroids will stay in the system x 2 weeks. Duration of benefits will depend on multiple posibilities including persistent irritating factors.  Side-effects: If present, they may typically last 2 weeks (the duration of the steroids).  Frequent: Cramps (if they occur, drink Gatorade and take over-the-counter Magnesium 450-500 mg  once to twice a day); water retention with temporary weight gain; increases in blood sugar; decreased immune system response; increased appetite.  Occasional: Facial flushing (red, warm cheeks); mood swings; menstrual changes.  Uncommon: Long-term decrease or suppression of natural hormones; bone thinning. (These are more common with higher doses or more frequent use. This is why we prefer that our patients avoid having any injection therapies in other practices.)   Very Rare: Severe mood changes; psychosis; aseptic necrosis.  From procedure: Some discomfort is to be expected once the numbing medicine wears off. This should be minimal if ice and heat are applied as instructed.  Call if: (When should I call?)  You experience numbness and weakness that gets worse with time, as opposed to wearing off.  New onset bowel or bladder incontinence. (Applies only to procedures done in the spine)  Emergency Numbers:  Durning business hours (Monday - Thursday, 8:00 AM - 4:00 PM) (Friday, 9:00 AM - 12:00 Noon): [phone]  After hours: [phone]  NOTE: If you are having a problem and are unable connect with, or to talk to a provider, then go to your nearest urgent care or emergency department. If the problem is serious and urgent, please call 911. ____________________________________________________________________________________________   Post-procedure Information What to expect: Most procedures involve the use of a local anesthetic (numbing medicine), and a steroid (anti-inflammatory medicine).  The local anesthetics may cause temporary numbness and weakness of the legs or arms, depending on the location of the block. This numbness/weakness may last 4-6 hours, depending on the local anesthetic used. In rare instances, it can last up to 24 hours. While numb, you must be very careful not to injure the extremity.  After any procedure, you could expect the pain to get better within 15-20  minutes. This relief is temporary and may last 4-6 hours. Once the local anesthetics wears off, you could experience discomfort, possibly more than usual, for up to 10 (ten) days. In the case of radiofrequencies, it may last up to 6 weeks. Surgeries may take up to 8 weeks for the healing process. The discomfort is due to the irritation caused by needles going through skin and muscle. To minimize the discomfort, we recommend using ice the first day, and heat from then on. The ice should be applied for 15 minutes on, and 15 minutes off. Keep repeating this cycle until bedtime. Avoid applying the ice directly to the skin, to prevent frostbite. Heat should be used daily, until the pain improves (4-10 days). Be careful not to burn yourself.  Occasionally you may experience muscle spasms or cramps. These occur as a consequence of the irritation caused by the needle sticks to the muscle and the blood that will inevitably be lost into the surrounding muscle tissue. Blood tends to be very irritating to tissues, which tend to react by going into spasm. These spasms may start the same day of your procedure, but they may also take days to develop. This late onset type of spasm or cramp is usually caused by electrolyte imbalances triggered by the steroids, at the level of the kidney. Cramps and spasms tend to respond well to muscle relaxants, multivitamins (some are triggered   by the procedure, but may have their origins in vitamin deficiencies), and "Gatorade", or any sports drinks that can replenish any electrolyte imbalances. (If you are a diabetic, ask your pharmacist to get you a sugar-free brand.) Warm showers or baths may also be helpful. Stretching exercises are highly recommended. General Instructions:  Be alert for signs of possible infection: redness, swelling, heat, red streaks, elevated temperature, and/or fever. These typically appear 4 to 6 days after the procedure. Immediately notify your doctor if you  experience unusual bleeding, difficulty breathing, or loss of bowel or bladder control. If you experience increased pain, do not increase your pain medicine intake, unless instructed by your pain physician. Post-Procedure Care:  Be careful in moving about. Muscle spasms in the area of the injection may occur. Applying ice or heat to the area is often helpful. The incidence of spinal headaches after epidural injections ranges between 1.4% and 6%. If you develop a headache that does not seem to respond to conservative therapy, please let your physician know. This can be treated with an epidural blood patch.   Post-procedure numbness or redness is to be expected, however it should average 4 to 6 hours. If numbness and weakness of your extremities begins to develop 4 to 6 hours after your procedure, and is felt to be progressing and worsening, immediately contact your physician.   Diet:  If you experience nausea, do not eat until this sensation goes away. If you had a "Stellate Ganglion Block" for upper extremity "Reflex Sympathetic Dystrophy", do not eat or drink until your hoarseness goes away. In any case, always start with liquids first and if you tolerate them well, then slowly progress to more solid foods. Activity:  For the first 4 to 6 hours after the procedure, use caution in moving about as you may experience numbness and/or weakness. Use caution in cooking, using household electrical appliances, and climbing steps. If you need to reach your Doctor call our office: [phone] Monday-Thursday 8:00 am - 4:00 PM    Fridays: Closed     In case of an emergency: In case of emergency, call 911 or go to the nearest emergency room and have the physician there call us.  Interpretation of Procedure Every nerve block has two components: a diagnostic component, and a treatment component. Unrealistic expectations are the most common causes of "perceived failure".  In a perfect world, a single nerve block  should be able to completely and permanently eliminate the pain. Sadly, the world is not perfect.  Most pain management nerve blocks are performed using local anesthetics and steroids. Steroids are responsible for any long-term benefit that you may experience. Their purpose is to decrease any chronic swelling that may exist in the area. Steroids begin to work immediately after being injected. However, most patients will not experience any benefits until 5 to 10 days after the injection, when the swelling has come down to the point where they can tell a difference. Steroids will only help if there is swelling to be treated. As such, they can assist with the diagnosis. If effective, they suggest an inflammatory component to the pain, and if ineffective, they rule out inflammation as the main cause or component of the problem. If the problem is one of mechanical compression, you will get no benefit from those steroids.   In the case of local anesthetics, they have a crucial role in the diagnosis of your condition. Most will begin to work within15 to 20 minutes after injection.   The duration will depend on the type used (short- vs. Long-acting). It is of outmost importance that patients keep tract of their pain, after the procedure. To assist with this matter, a "Post-procedure Pain Diary" is provided. Make sure to complete it and to bring it back to your follow-up appointment.  As long as the patient keeps accurate, detailed records of their symptoms after every procedure, and returns to have those interpreted, every procedure will provide us with invaluable information. Even a block that does not provide the patient with any relief, will always provide us with information about the mechanism and the origin of the pain. The only time a nerve block can be considered a waste of time is when patients do not keep track of the results, or do not keep their post-procedure appointment.  Reporting the results back to  your physician The Pain Score  Pain is a subjective complaint. It cannot be seen, touched, or measured. We depend entirely on the patient's report of the pain in order to assess your condition and treatment. To evaluate the pain, we use a pain scale, where "0" means "No Pain", and a "10" is "the worst possible pain that you can even imagine" (i.e. something like been eaten alive by a shark or being torn apart by a lion).   You will frequently be asked to rate your pain. Please be as accurate, remember that medical decisions will be based on your responses. Please do not rate your pain above a 10. Doing so is actually interpreted as "symptom magnification" (exaggeration), as well as lack of understanding with regards to the scale. To put this into perspective, when you tell us that your pain is at a 10 (ten), what you are saying is that there is nothing we can do to make this pain any worse. (Carefully think about that.)  

## 2020-11-16 ENCOUNTER — Telehealth: Payer: Self-pay | Admitting: *Deleted

## 2020-11-16 NOTE — Telephone Encounter (Signed)
Denies any post procedure issues. 

## 2020-11-29 ENCOUNTER — Ambulatory Visit: Payer: Medicaid Other | Admitting: Pain Medicine

## 2021-02-08 ENCOUNTER — Other Ambulatory Visit: Payer: Self-pay | Admitting: Cardiovascular Disease

## 2021-02-08 NOTE — Telephone Encounter (Signed)
Please contact pt for future appointment. ?Pt overdue for 3 month f/u. ?

## 2021-02-14 DIAGNOSIS — U071 COVID-19: Secondary | ICD-10-CM

## 2021-02-14 HISTORY — DX: COVID-19: U07.1

## 2021-02-18 ENCOUNTER — Emergency Department: Payer: Medicaid Other

## 2021-02-18 ENCOUNTER — Encounter: Payer: Self-pay | Admitting: Radiology

## 2021-02-18 ENCOUNTER — Inpatient Hospital Stay
Admission: EM | Admit: 2021-02-18 | Discharge: 2021-02-22 | DRG: 208 | Disposition: A | Discharge status: Other Acute Inpatient Hospital | Payer: Medicaid Other | Attending: Internal Medicine | Admitting: Internal Medicine

## 2021-02-18 ENCOUNTER — Other Ambulatory Visit: Payer: Self-pay

## 2021-02-18 DIAGNOSIS — J9611 Chronic respiratory failure with hypoxia: Secondary | ICD-10-CM | POA: Diagnosis present

## 2021-02-18 DIAGNOSIS — M47817 Spondylosis without myelopathy or radiculopathy, lumbosacral region: Secondary | ICD-10-CM | POA: Diagnosis present

## 2021-02-18 DIAGNOSIS — G4733 Obstructive sleep apnea (adult) (pediatric): Secondary | ICD-10-CM | POA: Diagnosis present

## 2021-02-18 DIAGNOSIS — I5043 Acute on chronic combined systolic (congestive) and diastolic (congestive) heart failure: Secondary | ICD-10-CM | POA: Diagnosis present

## 2021-02-18 DIAGNOSIS — G928 Other toxic encephalopathy: Secondary | ICD-10-CM | POA: Diagnosis present

## 2021-02-18 DIAGNOSIS — Z978 Presence of other specified devices: Secondary | ICD-10-CM

## 2021-02-18 DIAGNOSIS — Z6841 Body Mass Index (BMI) 40.0 and over, adult: Secondary | ICD-10-CM | POA: Diagnosis not present

## 2021-02-18 DIAGNOSIS — J80 Acute respiratory distress syndrome: Secondary | ICD-10-CM | POA: Diagnosis not present

## 2021-02-18 DIAGNOSIS — J9601 Acute respiratory failure with hypoxia: Secondary | ICD-10-CM | POA: Diagnosis not present

## 2021-02-18 DIAGNOSIS — E662 Morbid (severe) obesity with alveolar hypoventilation: Secondary | ICD-10-CM | POA: Diagnosis present

## 2021-02-18 DIAGNOSIS — E8729 Other acidosis: Secondary | ICD-10-CM | POA: Diagnosis present

## 2021-02-18 DIAGNOSIS — J9691 Respiratory failure, unspecified with hypoxia: Secondary | ICD-10-CM

## 2021-02-18 DIAGNOSIS — U071 COVID-19: Secondary | ICD-10-CM | POA: Diagnosis not present

## 2021-02-18 DIAGNOSIS — I110 Hypertensive heart disease with heart failure: Secondary | ICD-10-CM | POA: Diagnosis present

## 2021-02-18 DIAGNOSIS — J9620 Acute and chronic respiratory failure, unspecified whether with hypoxia or hypercapnia: Secondary | ICD-10-CM | POA: Diagnosis present

## 2021-02-18 DIAGNOSIS — G894 Chronic pain syndrome: Secondary | ICD-10-CM | POA: Diagnosis present

## 2021-02-18 DIAGNOSIS — J440 Chronic obstructive pulmonary disease with acute lower respiratory infection: Secondary | ICD-10-CM | POA: Diagnosis present

## 2021-02-18 DIAGNOSIS — R0602 Shortness of breath: Secondary | ICD-10-CM

## 2021-02-18 DIAGNOSIS — Z9049 Acquired absence of other specified parts of digestive tract: Secondary | ICD-10-CM

## 2021-02-18 DIAGNOSIS — I440 Atrioventricular block, first degree: Secondary | ICD-10-CM | POA: Diagnosis present

## 2021-02-18 DIAGNOSIS — N179 Acute kidney failure, unspecified: Secondary | ICD-10-CM

## 2021-02-18 DIAGNOSIS — J9612 Chronic respiratory failure with hypercapnia: Secondary | ICD-10-CM | POA: Diagnosis present

## 2021-02-18 DIAGNOSIS — Z9981 Dependence on supplemental oxygen: Secondary | ICD-10-CM | POA: Diagnosis not present

## 2021-02-18 DIAGNOSIS — J9621 Acute and chronic respiratory failure with hypoxia: Secondary | ICD-10-CM | POA: Diagnosis not present

## 2021-02-18 DIAGNOSIS — E785 Hyperlipidemia, unspecified: Secondary | ICD-10-CM | POA: Diagnosis present

## 2021-02-18 DIAGNOSIS — G4730 Sleep apnea, unspecified: Secondary | ICD-10-CM | POA: Diagnosis present

## 2021-02-18 DIAGNOSIS — Z79899 Other long term (current) drug therapy: Secondary | ICD-10-CM | POA: Diagnosis not present

## 2021-02-18 DIAGNOSIS — J441 Chronic obstructive pulmonary disease with (acute) exacerbation: Secondary | ICD-10-CM | POA: Diagnosis present

## 2021-02-18 DIAGNOSIS — Z87891 Personal history of nicotine dependence: Secondary | ICD-10-CM | POA: Diagnosis not present

## 2021-02-18 DIAGNOSIS — J1282 Pneumonia due to coronavirus disease 2019: Secondary | ICD-10-CM | POA: Diagnosis present

## 2021-02-18 DIAGNOSIS — Z7982 Long term (current) use of aspirin: Secondary | ICD-10-CM | POA: Diagnosis not present

## 2021-02-18 DIAGNOSIS — J9692 Respiratory failure, unspecified with hypercapnia: Secondary | ICD-10-CM | POA: Diagnosis not present

## 2021-02-18 HISTORY — DX: Tobacco use: Z72.0

## 2021-02-18 HISTORY — DX: Chronic obstructive pulmonary disease, unspecified: J44.9

## 2021-02-18 LAB — BASIC METABOLIC PANEL
Anion gap: 6 (ref 5–15)
BUN: 23 mg/dL (ref 8–23)
CO2: 37 mmol/L — ABNORMAL HIGH (ref 22–32)
Calcium: 7.8 mg/dL — ABNORMAL LOW (ref 8.9–10.3)
Chloride: 98 mmol/L (ref 98–111)
Creatinine, Ser: 1.44 mg/dL — ABNORMAL HIGH (ref 0.61–1.24)
GFR, Estimated: 55 mL/min — ABNORMAL LOW (ref >60)
Glucose, Bld: 99 mg/dL (ref 70–99)
Potassium: 4.1 mmol/L (ref 3.5–5.1)
Sodium: 141 mmol/L (ref 135–145)

## 2021-02-18 LAB — HEPATIC FUNCTION PANEL
ALT: 14 U/L (ref 0–44)
AST: 14 U/L — ABNORMAL LOW (ref 15–41)
Albumin: 3.1 g/dL — ABNORMAL LOW (ref 3.5–5.0)
Alkaline Phosphatase: 100 U/L (ref 38–126)
Bilirubin, Direct: <0.1 mg/dL (ref 0.0–0.2)
Total Bilirubin: 0.7 mg/dL (ref 0.3–1.2)
Total Protein: 7.9 g/dL (ref 6.5–8.1)

## 2021-02-18 LAB — RESP PANEL BY RT-PCR (FLU A&B, COVID) ARPGX2
Influenza A by PCR: NEGATIVE
Influenza B by PCR: NEGATIVE
SARS Coronavirus 2 by RT PCR: POSITIVE — AB

## 2021-02-18 LAB — CBC
HCT: 49 % (ref 39.0–52.0)
Hemoglobin: 13.1 g/dL (ref 13.0–17.0)
MCH: 23.1 pg — ABNORMAL LOW (ref 26.0–34.0)
MCHC: 26.7 g/dL — ABNORMAL LOW (ref 30.0–36.0)
MCV: 86.3 fL (ref 80.0–100.0)
Platelets: 151 10*3/uL (ref 150–400)
RBC: 5.68 MIL/uL (ref 4.22–5.81)
RDW: 19.7 % — ABNORMAL HIGH (ref 11.5–15.5)
WBC: 8.5 10*3/uL (ref 4.0–10.5)
nRBC: 3.5 % — ABNORMAL HIGH (ref 0.0–0.2)

## 2021-02-18 LAB — BLOOD GAS, ARTERIAL
Acid-Base Excess: 12.3 mmol/L — ABNORMAL HIGH (ref 0.0–2.0)
Acid-Base Excess: 10.5 mmol/L — ABNORMAL HIGH (ref 0.0–2.0)
Bicarbonate: 42.8 mmol/L — ABNORMAL HIGH (ref 20.0–28.0)
Bicarbonate: 43 mmol/L — ABNORMAL HIGH (ref 20.0–28.0)
Delivery systems: POSITIVE
Delivery systems: POSITIVE
Expiratory PAP: 5
Expiratory PAP: 5
FIO2: 0.5
FIO2: 0.6
Inspiratory PAP: 12
Inspiratory PAP: 16
O2 Saturation: 94.4 %
O2 Saturation: 93.6 %
Patient temperature: 37
Patient temperature: 37
pCO2 arterial: 87 mmHg (ref 32.0–48.0)
pCO2 arterial: 105 mmHg (ref 32.0–48.0)
pH, Arterial: 7.3 — ABNORMAL LOW (ref 7.350–7.450)
pH, Arterial: 7.22 — ABNORMAL LOW (ref 7.350–7.450)
pO2, Arterial: 80 mmHg — ABNORMAL LOW (ref 83.0–108.0)
pO2, Arterial: 82 mmHg — ABNORMAL LOW (ref 83.0–108.0)

## 2021-02-18 LAB — BRAIN NATRIURETIC PEPTIDE: B Natriuretic Peptide: 177 pg/mL — ABNORMAL HIGH (ref 0.0–100.0)

## 2021-02-18 LAB — PHOSPHORUS: Phosphorus: 4 mg/dL (ref 2.5–4.6)

## 2021-02-18 LAB — MAGNESIUM: Magnesium: 1.8 mg/dL (ref 1.7–2.4)

## 2021-02-18 LAB — GLUCOSE, CAPILLARY: Glucose-Capillary: 125 mg/dL — ABNORMAL HIGH (ref 70–99)

## 2021-02-18 LAB — TROPONIN I (HIGH SENSITIVITY)
Troponin I (High Sensitivity): 11 ng/L (ref <18)
Troponin I (High Sensitivity): 12 ng/L (ref <18)

## 2021-02-18 MED ORDER — PANTOPRAZOLE SODIUM 40 MG IV SOLR
40.0000 mg | Freq: Every day | INTRAVENOUS | Status: DC
  Administered 2021-02-19 – 2021-02-21 (×3): 40 mg via INTRAVENOUS
  Filled 2021-02-18 (×3): qty 40

## 2021-02-18 MED ORDER — POLYETHYLENE GLYCOL 3350 17 G PO PACK: 17.0000 g | PACK | Freq: Every day | ORAL | Status: DC | PRN

## 2021-02-18 MED ORDER — FUROSEMIDE 10 MG/ML IJ SOLN
60.0000 mg | Freq: Once | INTRAMUSCULAR | Status: AC
  Administered 2021-02-18: 60 mg via INTRAVENOUS
  Filled 2021-02-18: qty 8

## 2021-02-18 MED ORDER — FENTANYL CITRATE PF 50 MCG/ML IJ SOSY: 50.0000 ug | PREFILLED_SYRINGE | Freq: Once | INTRAMUSCULAR | Status: DC

## 2021-02-18 MED ORDER — SODIUM CHLORIDE 0.9 % IV SOLN
1.0000 mg/kg | Freq: Two times a day (BID) | INTRAVENOUS | Status: AC
  Administered 2021-02-19 – 2021-02-21 (×6): 167.5 mg via INTRAVENOUS
  Filled 2021-02-18 (×5): qty 2.68
  Filled 2021-02-18: qty 1.34
  Filled 2021-02-18: qty 2.68

## 2021-02-18 MED ORDER — FENTANYL CITRATE PF 50 MCG/ML IJ SOSY
50.0000 ug | PREFILLED_SYRINGE | INTRAMUSCULAR | Status: DC | PRN
  Administered 2021-02-18 (×2): 50 ug via INTRAVENOUS
  Filled 2021-02-18 (×2): qty 1

## 2021-02-18 MED ORDER — SUCCINYLCHOLINE CHLORIDE 200 MG/10ML IV SOSY
1.0000 mg/kg | PREFILLED_SYRINGE | Freq: Once | INTRAVENOUS | Status: AC
  Administered 2021-02-18: 167.8 mg via INTRAVENOUS
  Filled 2021-02-18: qty 10

## 2021-02-18 MED ORDER — SODIUM CHLORIDE 0.9 % IV SOLN
1.0000 g | INTRAVENOUS | Status: DC
  Administered 2021-02-19 – 2021-02-21 (×4): 1 g via INTRAVENOUS
  Filled 2021-02-18 (×4): qty 10
  Filled 2021-02-18: qty 1

## 2021-02-18 MED ORDER — CHLORHEXIDINE GLUCONATE CLOTH 2 % EX PADS
6.0000 | MEDICATED_PAD | Freq: Every day | CUTANEOUS | Status: DC
  Administered 2021-02-19 – 2021-02-22 (×4): 6 via TOPICAL
  Filled 2021-02-18: qty 6

## 2021-02-18 MED ORDER — ENOXAPARIN SODIUM 40 MG/0.4ML IJ SOSY: 40.0000 mg | PREFILLED_SYRINGE | INTRAMUSCULAR | Status: DC

## 2021-02-18 MED ORDER — IPRATROPIUM-ALBUTEROL 0.5-2.5 (3) MG/3ML IN SOLN
3.0000 mL | Freq: Four times a day (QID) | RESPIRATORY_TRACT | Status: DC
  Administered 2021-02-19 – 2021-02-22 (×14): 3 mL via RESPIRATORY_TRACT
  Filled 2021-02-18 (×15): qty 3

## 2021-02-18 MED ORDER — SODIUM CHLORIDE 0.9 % IV SOLN
100.0000 mg | Freq: Every day | INTRAVENOUS | Status: AC
  Administered 2021-02-19 – 2021-02-22 (×4): 100 mg via INTRAVENOUS
  Filled 2021-02-18: qty 100
  Filled 2021-02-18 (×3): qty 20

## 2021-02-18 MED ORDER — FENTANYL BOLUS VIA INFUSION
50.0000 ug | INTRAVENOUS | Status: DC | PRN
Start: 2021-02-18 — End: 2021-02-23
  Administered 2021-02-22: 100 ug via INTRAVENOUS
  Filled 2021-02-18: qty 100

## 2021-02-18 MED ORDER — PREDNISONE 20 MG PO TABS
50.0000 mg | ORAL_TABLET | Freq: Every day | ORAL | Status: DC
  Administered 2021-02-22: 50 mg via ORAL
  Filled 2021-02-18: qty 1

## 2021-02-18 MED ORDER — SIMVASTATIN 20 MG PO TABS
10.0000 mg | ORAL_TABLET | Freq: Every day | ORAL | Status: DC
  Administered 2021-02-19 – 2021-02-22 (×4): 10 mg
  Filled 2021-02-18 (×4): qty 1

## 2021-02-18 MED ORDER — FENTANYL CITRATE PF 50 MCG/ML IJ SOSY: 50.0000 ug | PREFILLED_SYRINGE | INTRAMUSCULAR | Status: DC | PRN

## 2021-02-18 MED ORDER — IPRATROPIUM-ALBUTEROL 0.5-2.5 (3) MG/3ML IN SOLN
3.0000 mL | Freq: Once | RESPIRATORY_TRACT | Status: AC
  Administered 2021-02-18: 3 mL via RESPIRATORY_TRACT
  Filled 2021-02-18: qty 3

## 2021-02-18 MED ORDER — POLYETHYLENE GLYCOL 3350 17 G PO PACK
17.0000 g | PACK | Freq: Every day | ORAL | Status: DC
  Administered 2021-02-19 – 2021-02-22 (×4): 17 g
  Filled 2021-02-18 (×4): qty 1

## 2021-02-18 MED ORDER — ACETAMINOPHEN 325 MG PO TABS: 650.0000 mg | ORAL_TABLET | Freq: Four times a day (QID) | ORAL | Status: DC | PRN

## 2021-02-18 MED ORDER — BUDESONIDE 0.25 MG/2ML IN SUSP
0.2500 mg | Freq: Two times a day (BID) | RESPIRATORY_TRACT | Status: DC
  Administered 2021-02-19 – 2021-02-22 (×7): 0.25 mg via RESPIRATORY_TRACT
  Filled 2021-02-18 (×8): qty 2

## 2021-02-18 MED ORDER — ZINC SULFATE 220 (50 ZN) MG PO CAPS
220.0000 mg | ORAL_CAPSULE | Freq: Every day | ORAL | Status: DC
  Administered 2021-02-19 – 2021-02-22 (×4): 220 mg
  Filled 2021-02-18 (×4): qty 1

## 2021-02-18 MED ORDER — PROPOFOL 1000 MG/100ML IV EMUL
5.0000 ug/kg/min | INTRAVENOUS | Status: DC
  Administered 2021-02-18: 5 ug/kg/min via INTRAVENOUS
  Administered 2021-02-18: 50 ug/kg/min via INTRAVENOUS
  Administered 2021-02-19: 40 ug/kg/min via INTRAVENOUS
  Administered 2021-02-19: 50 ug/kg/min via INTRAVENOUS
  Administered 2021-02-19: 40 ug/kg/min via INTRAVENOUS
  Administered 2021-02-19 (×3): 50 ug/kg/min via INTRAVENOUS
  Administered 2021-02-20 – 2021-02-21 (×10): 40 ug/kg/min via INTRAVENOUS
  Administered 2021-02-21 (×4): 50 ug/kg/min via INTRAVENOUS
  Administered 2021-02-21 – 2021-02-22 (×5): 40 ug/kg/min via INTRAVENOUS
  Administered 2021-02-22 (×4): 30 ug/kg/min via INTRAVENOUS
  Administered 2021-02-22: 40 ug/kg/min via INTRAVENOUS
  Administered 2021-02-22: 30 ug/kg/min via INTRAVENOUS
  Filled 2021-02-18 (×42): qty 100

## 2021-02-18 MED ORDER — FENTANYL 2500MCG IN NS 250ML (10MCG/ML) PREMIX INFUSION
50.0000 ug/h | INTRAVENOUS | Status: DC
  Administered 2021-02-19: 100 ug/h via INTRAVENOUS
  Administered 2021-02-19 – 2021-02-22 (×7): 200 ug/h via INTRAVENOUS
  Filled 2021-02-18 (×8): qty 250

## 2021-02-18 MED ORDER — BARICITINIB 2 MG PO TABS
2.0000 mg | ORAL_TABLET | Freq: Every day | ORAL | Status: DC
  Administered 2021-02-19 – 2021-02-20 (×2): 2 mg
  Filled 2021-02-18 (×4): qty 1

## 2021-02-18 MED ORDER — INSULIN ASPART 100 UNIT/ML IJ SOLN
0.0000 [IU] | INTRAMUSCULAR | Status: DC
  Administered 2021-02-19 – 2021-02-20 (×6): 3 [IU] via SUBCUTANEOUS
  Administered 2021-02-21: 4 [IU] via SUBCUTANEOUS
  Administered 2021-02-21: 3 [IU] via SUBCUTANEOUS
  Administered 2021-02-21: 4 [IU] via SUBCUTANEOUS
  Administered 2021-02-21 – 2021-02-22 (×6): 3 [IU] via SUBCUTANEOUS
  Filled 2021-02-18 (×16): qty 1

## 2021-02-18 MED ORDER — INSULIN ASPART 100 UNIT/ML IJ SOLN
0.0000 [IU] | INTRAMUSCULAR | Status: DC
Start: 2021-02-18 — End: 2021-02-18

## 2021-02-18 MED ORDER — ETOMIDATE 2 MG/ML IV SOLN
0.3000 mg/kg | Freq: Once | INTRAVENOUS | Status: AC
  Administered 2021-02-18: 50.34 mg via INTRAVENOUS
  Filled 2021-02-18: qty 30

## 2021-02-18 MED ORDER — SODIUM CHLORIDE 0.9 % IV SOLN
200.0000 mg | Freq: Once | INTRAVENOUS | Status: AC
  Administered 2021-02-18: 200 mg via INTRAVENOUS
  Filled 2021-02-18: qty 40

## 2021-02-18 MED ORDER — DOCUSATE SODIUM 50 MG/5ML PO LIQD
100.0000 mg | Freq: Two times a day (BID) | ORAL | Status: DC
  Administered 2021-02-19 – 2021-02-22 (×7): 100 mg
  Filled 2021-02-18 (×8): qty 10

## 2021-02-18 MED ORDER — ENOXAPARIN SODIUM 100 MG/ML IJ SOSY
0.5000 mg/kg | PREFILLED_SYRINGE | INTRAMUSCULAR | Status: DC
  Administered 2021-02-19 – 2021-02-21 (×3): 85 mg via SUBCUTANEOUS
  Filled 2021-02-18 (×5): qty 0.85

## 2021-02-18 MED ORDER — ASCORBIC ACID 500 MG PO TABS
500.0000 mg | ORAL_TABLET | Freq: Every day | ORAL | Status: DC
  Administered 2021-02-19 – 2021-02-22 (×4): 500 mg
  Filled 2021-02-18 (×4): qty 1

## 2021-02-18 MED ORDER — MIDAZOLAM HCL 2 MG/2ML IJ SOLN
2.0000 mg | INTRAMUSCULAR | Status: DC | PRN
  Administered 2021-02-21 – 2021-02-22 (×3): 2 mg via INTRAVENOUS
  Filled 2021-02-18 (×3): qty 2

## 2021-02-18 MED ORDER — DOCUSATE SODIUM 100 MG PO CAPS: 100.0000 mg | ORAL_CAPSULE | Freq: Two times a day (BID) | ORAL | Status: DC | PRN

## 2021-02-18 MED ORDER — METHYLPREDNISOLONE SODIUM SUCC 40 MG IJ SOLR: 40.0000 mg | Freq: Two times a day (BID) | INTRAMUSCULAR | Status: DC

## 2021-02-18 NOTE — ED Notes (Signed)
9:31 PM 60mg  Propofol given per MD goodman verbal order at bedside. Pt awake trying to self extubate
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
## 2021-02-18 NOTE — ED Notes (Signed)
Pt with decreasing mental status, GCS 5. MD at bedside with RT. Aware of current status. Pts wife at bedside.

## 2021-02-18 NOTE — ED Notes (Signed)
Per Domingo Pulse ARNP, increase propofol to 28mcg/kg/min for sedation.

## 2021-02-18 NOTE — ED Notes (Signed)
60mg  propofol verbal order administered as pt awake. MD goodman at bedside to give verbal order.

## 2021-02-18 NOTE — ED Notes (Signed)
X-ray at bedside

## 2021-02-18 NOTE — Progress Notes (Signed)
PHARMACIST - PHYSICIAN COMMUNICATION  CONCERNING:  Enoxaparin (Lovenox) for DVT Prophylaxis    RECOMMENDATION: Patient was prescribed enoxaprin 40mg  q24 hours for VTE prophylaxis.   Filed Weights   02/18/21 1830  Weight: (!) 167.8 kg (370 lb)    Body mass index is 46.25 kg/m.  Estimated Creatinine Clearance: 87.5 mL/min (A) (by C-G formula based on SCr of 1.44 mg/dL (H)).   Based on [place] patient is candidate for enoxaparin 0.5mg /kg TBW SQ every 24 hours based on BMI being >30.   DESCRIPTION: Pharmacy has adjusted enoxaparin dose per [facility] policy.  Patient is now receiving enoxaparin 85 mg every 24 hours    Sherilyn Banker, PharmD Clinical Pharmacist  02/18/2021 9:28 PM

## 2021-02-18 NOTE — Progress Notes (Addendum)
PHARMACY CONSULT NOTE - FOLLOW UP  Pharmacy Consult for Electrolyte Monitoring and Replacement   Recent Labs: Potassium (mmol/L)  Date Value  02/18/2021 4.1  04/15/2014 3.9   Magnesium (mg/dL)  Date Value  03/05/2020 2.5 (H)  08/21/2012 1.9   Calcium (mg/dL)  Date Value  02/18/2021 7.8 (L)   Calcium, Total (mg/dL)  Date Value  04/15/2014 8.3 (L)   Albumin (g/dL)  Date Value  03/02/2020 3.3 (L)  04/15/2014 2.9 (L)   Phosphorus (mg/dL)  Date Value  03/02/2020 7.1 (H)   Sodium (mmol/L)  Date Value  02/18/2021 141  03/15/2020 145 (H)  04/15/2014 140   Corr Ca: 8.52 mg/dL  Assessment: 65 y.o. male who presents to the emergency department today because of concerns for shortness of breath. Pharmacy has been consulted for electrolyte replacement  Goal of Therapy:  Electrolytes WNL  Plan:  No replacement indicated at this time Recheck electrolytes with AM labs  Sherilyn Banker ,PharmD Clinical Pharmacist 02/18/2021 9:18 PM

## 2021-02-18 NOTE — ED Notes (Addendum)
ED MD, RT at bedside preparing for intubation. Bipap set to 100% FiO2 for preoxygenation.    Etomidate 8:58 PM Succ 8:58 PM   Given by [place] RN per [facility]   Pt bagged by Archie Balboa prior to intubation.    8.0 ett tube at lip 25cm.   + color change, bilateral breath sounds by [place] RN  OG placed 60 cm

## 2021-02-18 NOTE — ED Notes (Signed)
Report given to [place] RN.

## 2021-02-18 NOTE — ED Triage Notes (Signed)
Pt arrives via EMS from home for [facility] and weakness x2 days- gf called EMS pt was in 60s RA, pt arrives on cpap, pt was given 125 solumedrol, albuterol, atrovent- pt has a hx of COPD, CHF, Hypertension- other VSS
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
## 2021-02-19 DIAGNOSIS — U071 COVID-19: Secondary | ICD-10-CM

## 2021-02-19 DIAGNOSIS — E785 Hyperlipidemia, unspecified: Secondary | ICD-10-CM

## 2021-02-19 DIAGNOSIS — J9691 Respiratory failure, unspecified with hypoxia: Secondary | ICD-10-CM | POA: Diagnosis not present

## 2021-02-19 DIAGNOSIS — J9692 Respiratory failure, unspecified with hypercapnia: Secondary | ICD-10-CM

## 2021-02-19 DIAGNOSIS — Z6841 Body Mass Index (BMI) 40.0 and over, adult: Secondary | ICD-10-CM | POA: Diagnosis not present

## 2021-02-19 DIAGNOSIS — J80 Acute respiratory distress syndrome: Secondary | ICD-10-CM

## 2021-02-19 LAB — COMPREHENSIVE METABOLIC PANEL
ALT: 14 U/L (ref 0–44)
AST: 14 U/L — ABNORMAL LOW (ref 15–41)
Albumin: 3.3 g/dL — ABNORMAL LOW (ref 3.5–5.0)
Alkaline Phosphatase: 106 U/L (ref 38–126)
Anion gap: 8 (ref 5–15)
BUN: 22 mg/dL (ref 8–23)
CO2: 36 mmol/L — ABNORMAL HIGH (ref 22–32)
Calcium: 8.1 mg/dL — ABNORMAL LOW (ref 8.9–10.3)
Chloride: 95 mmol/L — ABNORMAL LOW (ref 98–111)
Creatinine, Ser: 1.37 mg/dL — ABNORMAL HIGH (ref 0.61–1.24)
GFR, Estimated: 58 mL/min — ABNORMAL LOW (ref >60)
Glucose, Bld: 126 mg/dL — ABNORMAL HIGH (ref 70–99)
Potassium: 4.1 mmol/L (ref 3.5–5.1)
Sodium: 139 mmol/L (ref 135–145)
Total Bilirubin: 0.7 mg/dL (ref 0.3–1.2)
Total Protein: 8.3 g/dL — ABNORMAL HIGH (ref 6.5–8.1)

## 2021-02-19 LAB — MRSA NEXT GEN BY PCR, NASAL: MRSA by PCR Next Gen: NOT DETECTED

## 2021-02-19 LAB — RESPIRATORY PANEL BY PCR

## 2021-02-19 LAB — BLOOD GAS, ARTERIAL
Acid-Base Excess: 12 mmol/L — ABNORMAL HIGH (ref 0.0–2.0)
Bicarbonate: 41.7 mmol/L — ABNORMAL HIGH (ref 20.0–28.0)
FIO2: 1
MECHVT: 500 mL
Mechanical Rate: 20
O2 Saturation: 94.5 %
PEEP: 5 cmH2O
Patient temperature: 37
pCO2 arterial: 79 mmHg (ref 32.0–48.0)
pH, Arterial: 7.33 — ABNORMAL LOW (ref 7.350–7.450)
pO2, Arterial: 78 mmHg — ABNORMAL LOW (ref 83.0–108.0)

## 2021-02-19 LAB — TRIGLYCERIDES: Triglycerides: 107 mg/dL (ref <150)

## 2021-02-19 LAB — PROCALCITONIN
Procalcitonin: 0.17 ng/mL
Procalcitonin: 0.28 ng/mL

## 2021-02-19 LAB — GLUCOSE, CAPILLARY
Glucose-Capillary: 127 mg/dL — ABNORMAL HIGH (ref 70–99)
Glucose-Capillary: 137 mg/dL — ABNORMAL HIGH (ref 70–99)
Glucose-Capillary: 141 mg/dL — ABNORMAL HIGH (ref 70–99)
Glucose-Capillary: 147 mg/dL — ABNORMAL HIGH (ref 70–99)
Glucose-Capillary: 138 mg/dL — ABNORMAL HIGH (ref 70–99)

## 2021-02-19 LAB — URINALYSIS, COMPLETE (UACMP) WITH MICROSCOPIC
Bilirubin Urine: NEGATIVE
Glucose, UA: NEGATIVE mg/dL
Ketones, ur: NEGATIVE mg/dL
Nitrite: NEGATIVE
Protein, ur: NEGATIVE mg/dL
RBC / HPF: >50 RBC/hpf — ABNORMAL HIGH (ref 0–5)
Specific Gravity, Urine: 1.019 (ref 1.005–1.030)
WBC, UA: >50 WBC/hpf — ABNORMAL HIGH (ref 0–5)
pH: 5 (ref 5.0–8.0)

## 2021-02-19 LAB — CBC WITH DIFFERENTIAL/PLATELET
Abs Immature Granulocytes: 0.06 10*3/uL (ref 0.00–0.07)
Basophils Absolute: 0 10*3/uL (ref 0.0–0.1)
Basophils Relative: 0 %
Eosinophils Absolute: 0 10*3/uL (ref 0.0–0.5)
Eosinophils Relative: 0 %
HCT: 51.4 % (ref 39.0–52.0)
Hemoglobin: 14.1 g/dL (ref 13.0–17.0)
Immature Granulocytes: 1 %
Lymphocytes Relative: 9 %
Lymphs Abs: 0.9 10*3/uL (ref 0.7–4.0)
MCH: 23.7 pg — ABNORMAL LOW (ref 26.0–34.0)
MCHC: 27.4 g/dL — ABNORMAL LOW (ref 30.0–36.0)
MCV: 86.4 fL (ref 80.0–100.0)
Monocytes Absolute: 0.1 10*3/uL (ref 0.1–1.0)
Monocytes Relative: 1 %
Neutro Abs: 8 10*3/uL — ABNORMAL HIGH (ref 1.7–7.7)
Neutrophils Relative %: 89 %
Platelets: 153 10*3/uL (ref 150–400)
RBC: 5.95 MIL/uL — ABNORMAL HIGH (ref 4.22–5.81)
RDW: 19.7 % — ABNORMAL HIGH (ref 11.5–15.5)
WBC: 9 10*3/uL (ref 4.0–10.5)
nRBC: 2.9 % — ABNORMAL HIGH (ref 0.0–0.2)

## 2021-02-19 LAB — LACTATE DEHYDROGENASE: LDH: 216 U/L — ABNORMAL HIGH (ref 98–192)

## 2021-02-19 LAB — D-DIMER, QUANTITATIVE
D-Dimer, Quant: 1.02 ug/mL-FEU — ABNORMAL HIGH (ref 0.00–0.50)
D-Dimer, Quant: 1.57 ug/mL-FEU — ABNORMAL HIGH (ref 0.00–0.50)

## 2021-02-19 LAB — HIV ANTIBODY (ROUTINE TESTING W REFLEX): HIV Screen 4th Generation wRfx: NONREACTIVE

## 2021-02-19 LAB — C-REACTIVE PROTEIN
CRP: 3 mg/dL — ABNORMAL HIGH (ref <1.0)
CRP: 3.2 mg/dL — ABNORMAL HIGH (ref <1.0)

## 2021-02-19 LAB — FERRITIN
Ferritin: 10 ng/mL — ABNORMAL LOW (ref 24–336)
Ferritin: 13 ng/mL — ABNORMAL LOW (ref 24–336)

## 2021-02-19 LAB — FIBRINOGEN: Fibrinogen: 679 mg/dL — ABNORMAL HIGH (ref 210–475)

## 2021-02-19 LAB — PHOSPHORUS: Phosphorus: 3.5 mg/dL (ref 2.5–4.6)

## 2021-02-19 LAB — MAGNESIUM: Magnesium: 2.2 mg/dL (ref 1.7–2.4)

## 2021-02-19 MED ORDER — FUROSEMIDE 10 MG/ML IJ SOLN
40.0000 mg | Freq: Once | INTRAMUSCULAR | Status: AC
  Administered 2021-02-19: 40 mg via INTRAVENOUS
  Filled 2021-02-19: qty 4

## 2021-02-19 NOTE — Progress Notes (Signed)
Remdesivir - Pharmacy Brief Note   O:  CXR: "Mild peribronchial thickening, may be pulmonary edema or bronchitis." SpO2: 92-99% on RA   A/P:  Remdesivir 200 mg IVPB once followed by 100 mg IVPB daily x 4 days.   Renda Rolls, PharmD, [facility] 02/19/2021 4:06 AM

## 2021-02-19 NOTE — Progress Notes (Signed)
Abg obtained. Peep increased to 8 per NP.

## 2021-02-19 NOTE — Progress Notes (Signed)
[facility] for Electrolyte Monitoring and Replacement   Recent Labs: Potassium (mmol/L)  Date Value  02/19/2021 4.1  04/15/2014 3.9   Magnesium (mg/dL)  Date Value  02/19/2021 2.2  08/21/2012 1.9   Calcium (mg/dL)  Date Value  02/19/2021 8.1 (L)   Calcium, Total (mg/dL)  Date Value  04/15/2014 8.3 (L)   Albumin (g/dL)  Date Value  02/19/2021 3.3 (L)  04/15/2014 2.9 (L)   Phosphorus (mg/dL)  Date Value  02/19/2021 3.5   Sodium (mmol/L)  Date Value  02/19/2021 139  03/15/2020 145 (H)  04/15/2014 140    Assessment: 65 y.o. male who presents to the emergency department today because of concerns for shortness of breath. Found to have COVID-19 infection. There is also concern for superimposed bacterial pneumonia. Pharmacy has been consulted for electrolyte replacement  Goal of Therapy:  Electrolytes within normal limits  Plan:  No replacement indicated at this time Recheck electrolytes with AM labs  Benita Gutter 02/19/2021 8:01 AM

## 2021-02-19 NOTE — Progress Notes (Addendum)
NAME:  Gary [name], MRN:  [id number], DOB:  07-Jun-1955, LOS: 1 ADMISSION DATE:  02/18/2021  BRIEF SYNOPSIS ADMITTED FOR COVID INFECTION   Significant Hospital Events: Including procedures, antibiotic start and stop dates in addition to other pertinent events   11/5 admitted for severe COVID 19 infection and pneumonia   Micro Data:  COVID +  Antimicrobials:   Antibiotics Given (last 72 hours)     Date/Time Action Medication Dose Rate   02/18/21 2359 New Bag/Given   remdesivir 200 mg in sodium chloride 0.9% 250 mL IVPB 200 mg 580 mL/hr   02/19/21 0055 New Bag/Given   cefTRIAXone (ROCEPHIN) 1 g in sodium chloride 0.9 % 100 mL IVPB 1 g 200 mL/hr   02/19/21 1034 New Bag/Given   remdesivir 100 mg in sodium chloride 0.9 % 100 mL IVPB 100 mg 200 mL/hr      Interim History / Subjective:  Remains critically ill Severe hypoxia Multiorgan failure       Objective   Blood pressure 118/72, pulse 80, temperature (!) 97.3 F (36.3 C), resp. rate (!) 21, height '6\' 3"'  (1.905 m), weight (!) 191.7 kg, SpO2 95 %.    Vent Mode: PRVC FiO2 (%):  [75 %-100 %] 75 % Set Rate:  [20 bmp] 20 bmp Vt Set:  [500 mL] 500 mL PEEP:  [5 cmH20-8 cmH20] 8 cmH20 Plateau Pressure:  [22 cmH20] 22 cmH20   Intake/Output Summary (Last 24 hours) at 02/19/2021 1419 Last data filed at 02/19/2021 1300 Gross per 24 hour  Intake 2132.43 ml  Output 220 ml  Net 1912.43 ml   Filed Weights   02/18/21 1830 02/18/21 2300  Weight: (!) 167.8 kg (!) 191.7 kg      REVIEW OF SYSTEMS  PATIENT IS UNABLE TO PROVIDE COMPLETE REVIEW OF SYSTEMS DUE TO SEVERE CRITICAL ILLNESS AND TOXIC METABOLIC ENCEPHALOPATHY  ALL OTHER ROS ARE NEGATIVE   PHYSICAL EXAMINATION:  GENERAL:critically ill appearing, +resp distress EYES: Pupils equal, round, reactive to light.  No scleral icterus.  MOUTH: Moist mucosal membrane. INTUBATED NECK: Supple.  PULMONARY: +rhonchi, +wheezing CARDIOVASCULAR: S1 and S2.  No murmurs   GASTROINTESTINAL: Soft, nontender, -distended. Positive bowel sounds.  MUSCULOSKELETAL: No swelling, clubbing, or edema.  NEUROLOGIC: obtunded SKIN:intact,warm,dry      ASSESSMENT AND PLAN SYNOPSIS    Severe COVID-19 infection, ARDS and pneumonia/pneumonitis Continue IV steroids  Continue IV remdisivir Oral Baricitinib proning as tolerated due to severe hypoxia  if needed  Maintain airborne and contact precautions  As needed bronchodilators (MDI) Vitamin C and zinc Antitussives High risk death Vent Mode: PRVC FiO2 (%):  [75 %-100 %] 75 % Set Rate:  [20 bmp] 20 bmp Vt Set:  [500 mL] 500 mL PEEP:  [5 cmH20-8 cmH20] 8 cmH20 Plateau Pressure:  [22 cmH20] 22 cmH20    Acute hypoxemic respiratory failure due to COVID-19 pneumonia/ARDS Mechanical ventilation via ARDS protocol, target PRVC 6 cc/kg Wean PEEP and FiO2 as able Goal plateau pressure less than 30, driving pressure less than 15 Paralytics if necessary for vent synchrony, gas exchange Deep sedation per PAD protocol diuresis as tolerated based on Kidney function VAP prevention order set   Severe ACUTE Hypoxic and Hypercapnic Respiratory Failure -continue Mechanical Ventilator support -continue Bronchodilator Therapy -Wean Fio2 and PEEP as tolerated -VAP/VENT bundle implementation -will NOT perform SAT/SBT when respiratory parameters are met  Vent Mode: PRVC FiO2 (%):  [75 %-100 %] 75 % Set Rate:  [20 bmp] 20 bmp Vt Set:  [500 mL]  500 mL PEEP:  [5 cmH20-8 cmH20] 8 cmH20 Plateau Pressure:  [22 cmH20] 22 cmH20   SEVERE COPD EXACERBATION -continue IV steroids as prescribed -continue NEB THERAPY as prescribed  CARDIAC ICU monitoring   ACUTE KIDNEY INJURY/Renal Failure -continue Foley Catheter-assess need -Avoid nephrotoxic agents -Follow urine output, BMP -Ensure adequate renal perfusion, optimize oxygenation -Renal dose medications   Intake/Output Summary (Last 24 hours) at 02/19/2021 1419 Last  data filed at 02/19/2021 1300 Gross per 24 hour  Intake 2132.43 ml  Output 220 ml  Net 1912.43 ml   BMP Latest Ref Rng & Units 02/19/2021 02/18/2021 06/27/2020  Glucose 70 - 99 mg/dL 126(H) 99 75  BUN 8 - 23 mg/dL '22 23 18  ' Creatinine 0.61 - 1.24 mg/dL 1.37(H) 1.44(H) 1.51(H)  BUN/Creat Ratio 10 - 24 - - -  Sodium 135 - 145 mmol/L 139 141 139  Potassium 3.5 - 5.1 mmol/L 4.1 4.1 4.2  Chloride 98 - 111 mmol/L 95(L) 98 104  CO2 22 - 32 mmol/L 36(H) 37(H) 30  Calcium 8.9 - 10.3 mg/dL 8.1(L) 7.8(L) 8.3(L)      NEUROLOGY Acute toxic metabolic encephalopathy, need for sedation Goal RASS -2 to -3  INFECTIOUS DISEASE -continue antibiotics as prescribed -follow up cultures   ENDO - ICU hypoglycemic\Hyperglycemia protocol -check FSBS per protocol   GI GI PROPHYLAXIS as indicated  NUTRITIONAL STATUS DIET-->TF's as tolerated Constipation protocol as indicated   ELECTROLYTES -follow labs as needed -replace as needed -pharmacy consultation and following   Best Practice (right click and "Reselect all SmartList Selections" daily)  Diet/type: NPO w/ meds via tube DVT prophylaxis: LMWH GI prophylaxis: PPI Lines: N/A Foley:  Yes, and it is still needed Code Status:  full code Last date of multidisciplinary goals of care discussion [02/18/21] * in ED    Labs   CBC: Recent Labs  Lab 02/18/21 1847 02/19/21 0451  WBC 8.5 9.0  NEUTROABS  --  8.0*  HGB 13.1 14.1  HCT 49.0 51.4  MCV 86.3 86.4  PLT 151 759    Basic Metabolic Panel: Recent Labs  Lab 02/18/21 1847 02/18/21 2031 02/19/21 0451  NA 141  --  139  K 4.1  --  4.1  CL 98  --  95*  CO2 37*  --  36*  GLUCOSE 99  --  126*  BUN 23  --  22  CREATININE 1.44*  --  1.37*  CALCIUM 7.8*  --  8.1*  MG  --  1.8 2.2  PHOS  --  4.0 3.5   GFR: Estimated Creatinine Clearance: 99.5 mL/min (A) (by C-G formula based on SCr of 1.37 mg/dL (H)). Recent Labs  Lab 02/18/21 1847 02/18/21 2031 02/19/21 0451  PROCALCITON   --  0.17 0.28  WBC 8.5  --  9.0    Liver Function Tests: Recent Labs  Lab 02/18/21 2031 02/19/21 0451  AST 14* 14*  ALT 14 14  ALKPHOS 100 106  BILITOT 0.7 0.7  PROT 7.9 8.3*  ALBUMIN 3.1* 3.3*   No results for input(s): LIPASE, AMYLASE in the last 168 hours. No results for input(s): AMMONIA in the last 168 hours.  ABG    Component Value Date/Time   PHART 7.33 (L) 02/18/2021 2236   PCO2ART 79 (HH) 02/18/2021 2236   PO2ART 78 (L) 02/18/2021 2236   HCO3 41.7 (H) 02/18/2021 2236   O2SAT 94.5 02/18/2021 2236     Coagulation Profile: No results for input(s): INR, PROTIME in the last 168 hours.  Cardiac Enzymes: No results for input(s): CKTOTAL, CKMB, CKMBINDEX, TROPONINI in the last 168 hours.  HbA1C: Hgb A1c MFr Bld  Date/Time Value Ref Range Status  01/10/2015 10:44 PM 5.5 4.0 - 6.0 % Final    CBG: Recent Labs  Lab 02/18/21 2258 02/19/21 0735 02/19/21 1117  GLUCAP 125* 137* 141*    Allergies No Known Allergies     DVT/GI PRX  assessed I Assessed the need for Labs I Assessed the need for Foley I Assessed the need for Central Venous Line Family Discussion when available I Assessed the need for Mobilization I made an Assessment of medications to be adjusted accordingly Safety Risk assessment completed  CASE DISCUSSED IN MULTIDISCIPLINARY ROUNDS WITH ICU TEAM     Critical Care Time devoted to patient care services described in this note is 55  minutes.  Critical care was necessary to treat or prevent imminent or life-threatening deterioration.   PATIENT WITH VERY POOR PROGNOSIS I ANTICIPATE PROLONGED ICU LOS  Patient with Multiorgan failure and at high risk for cardiac arrest and death.    [name] Parker, M.D.  [facility]  Medical Director [place] Director [facility] Cardio-Pulmonary Department

## 2021-02-19 NOTE — Progress Notes (Signed)
GOALS OF CARE DISCUSSION  The Clinical status was relayed to family in detail. Sister Gary [name] Updated and notified of patients medical condition.    Patient remains unresponsive and will not open eyes to command.   Patient is having a weak cough and struggling to remove secretions.   Patient with increased WOB and using accessory muscles to breathe Explained to family course of therapy and the modalities    Patient with Progressive multiorgan failure with a very high probablity of a very minimal chance of meaningful recovery despite all aggressive and optimal medical therapy.  PATIENT REMAINS FULL CODE  Family understands the situation.  Family are satisfied with Plan of action and management. All questions answered  Additional CC time 35 mins   Gary [name] Patricia Pesa, M.D.  [facility]  Medical Director [place] Director [facility] Cardio-Pulmonary Department

## 2021-02-20 ENCOUNTER — Encounter: Payer: Self-pay | Admitting: Internal Medicine

## 2021-02-20 ENCOUNTER — Inpatient Hospital Stay: Payer: Medicaid Other

## 2021-02-20 DIAGNOSIS — J9692 Respiratory failure, unspecified with hypercapnia: Secondary | ICD-10-CM | POA: Diagnosis not present

## 2021-02-20 DIAGNOSIS — U071 COVID-19: Secondary | ICD-10-CM | POA: Diagnosis not present

## 2021-02-20 DIAGNOSIS — J9691 Respiratory failure, unspecified with hypoxia: Secondary | ICD-10-CM | POA: Diagnosis not present

## 2021-02-20 LAB — CBC WITH DIFFERENTIAL/PLATELET
Abs Immature Granulocytes: 0.12 10*3/uL — ABNORMAL HIGH (ref 0.00–0.07)
Basophils Absolute: 0 10*3/uL (ref 0.0–0.1)
Basophils Relative: 0 %
Eosinophils Absolute: 0 10*3/uL (ref 0.0–0.5)
Eosinophils Relative: 0 %
HCT: 49.7 % (ref 39.0–52.0)
Hemoglobin: 13.6 g/dL (ref 13.0–17.0)
Immature Granulocytes: 1 %
Lymphocytes Relative: 6 %
Lymphs Abs: 0.6 10*3/uL — ABNORMAL LOW (ref 0.7–4.0)
MCH: 24 pg — ABNORMAL LOW (ref 26.0–34.0)
MCHC: 27.4 g/dL — ABNORMAL LOW (ref 30.0–36.0)
MCV: 87.7 fL (ref 80.0–100.0)
Monocytes Absolute: 0.4 10*3/uL (ref 0.1–1.0)
Monocytes Relative: 4 %
Neutro Abs: 8.6 10*3/uL — ABNORMAL HIGH (ref 1.7–7.7)
Neutrophils Relative %: 89 %
Platelets: 156 10*3/uL (ref 150–400)
RBC: 5.67 MIL/uL (ref 4.22–5.81)
RDW: 19.9 % — ABNORMAL HIGH (ref 11.5–15.5)
WBC: 9.7 10*3/uL (ref 4.0–10.5)
nRBC: 3.3 % — ABNORMAL HIGH (ref 0.0–0.2)

## 2021-02-20 LAB — D-DIMER, QUANTITATIVE: D-Dimer, Quant: 2.75 ug/mL-FEU — ABNORMAL HIGH (ref 0.00–0.50)

## 2021-02-20 LAB — BRAIN NATRIURETIC PEPTIDE: B Natriuretic Peptide: 52.2 pg/mL (ref 0.0–100.0)

## 2021-02-20 LAB — COMPREHENSIVE METABOLIC PANEL
ALT: 14 U/L (ref 0–44)
AST: 12 U/L — ABNORMAL LOW (ref 15–41)
Albumin: 3 g/dL — ABNORMAL LOW (ref 3.5–5.0)
Alkaline Phosphatase: 97 U/L (ref 38–126)
Anion gap: 9 (ref 5–15)
BUN: 31 mg/dL — ABNORMAL HIGH (ref 8–23)
CO2: 35 mmol/L — ABNORMAL HIGH (ref 22–32)
Calcium: 8.1 mg/dL — ABNORMAL LOW (ref 8.9–10.3)
Chloride: 96 mmol/L — ABNORMAL LOW (ref 98–111)
Creatinine, Ser: 1.38 mg/dL — ABNORMAL HIGH (ref 0.61–1.24)
GFR, Estimated: 57 mL/min — ABNORMAL LOW (ref >60)
Glucose, Bld: 136 mg/dL — ABNORMAL HIGH (ref 70–99)
Potassium: 4.6 mmol/L (ref 3.5–5.1)
Sodium: 140 mmol/L (ref 135–145)
Total Bilirubin: 0.6 mg/dL (ref 0.3–1.2)
Total Protein: 7.9 g/dL (ref 6.5–8.1)

## 2021-02-20 LAB — GLUCOSE, CAPILLARY
Glucose-Capillary: 117 mg/dL — ABNORMAL HIGH (ref 70–99)
Glucose-Capillary: 120 mg/dL — ABNORMAL HIGH (ref 70–99)
Glucose-Capillary: 128 mg/dL — ABNORMAL HIGH (ref 70–99)
Glucose-Capillary: 130 mg/dL — ABNORMAL HIGH (ref 70–99)
Glucose-Capillary: 146 mg/dL — ABNORMAL HIGH (ref 70–99)
Glucose-Capillary: 148 mg/dL — ABNORMAL HIGH (ref 70–99)
Glucose-Capillary: 156 mg/dL — ABNORMAL HIGH (ref 70–99)

## 2021-02-20 LAB — PHOSPHORUS: Phosphorus: 5.5 mg/dL — ABNORMAL HIGH (ref 2.5–4.6)

## 2021-02-20 LAB — PROCALCITONIN: Procalcitonin: 0.11 ng/mL

## 2021-02-20 LAB — FERRITIN: Ferritin: 12 ng/mL — ABNORMAL LOW (ref 24–336)

## 2021-02-20 LAB — MAGNESIUM: Magnesium: 2.4 mg/dL (ref 1.7–2.4)

## 2021-02-20 LAB — C-REACTIVE PROTEIN: CRP: 2.2 mg/dL — ABNORMAL HIGH (ref <1.0)

## 2021-02-20 MED ORDER — FREE WATER
150.0000 mL | Status: DC
  Administered 2021-02-20 – 2021-02-22 (×14): 150 mL

## 2021-02-20 MED ORDER — VITAL HIGH PROTEIN PO LIQD
1000.0000 mL | ORAL | Status: DC
  Administered 2021-02-20 – 2021-02-22 (×2): 1000 mL

## 2021-02-20 MED ORDER — ADULT MULTIVITAMIN LIQUID CH
15.0000 mL | Freq: Every day | ORAL | Status: DC
  Administered 2021-02-21 – 2021-02-22 (×2): 15 mL
  Filled 2021-02-20 (×3): qty 15

## 2021-02-20 MED ORDER — PROSOURCE TF PO LIQD
90.0000 mL | Freq: Every day | ORAL | Status: DC
  Administered 2021-02-20 – 2021-02-22 (×11): 90 mL
  Filled 2021-02-20 (×14): qty 90
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
## 2021-02-20 NOTE — Progress Notes (Signed)
[place] for Electrolyte Monitoring and Replacement   Recent Labs: Potassium (mmol/L)  Date Value  02/20/2021 4.6  04/15/2014 3.9   Magnesium (mg/dL)  Date Value  02/20/2021 2.4  08/21/2012 1.9   Calcium (mg/dL)  Date Value  02/20/2021 8.1 (L)   Calcium, Total (mg/dL)  Date Value  04/15/2014 8.3 (L)   Albumin (g/dL)  Date Value  02/20/2021 3.0 (L)  04/15/2014 2.9 (L)   Phosphorus (mg/dL)  Date Value  02/20/2021 5.5 (H)   Sodium (mmol/L)  Date Value  02/20/2021 140  03/15/2020 145 (H)  04/15/2014 140    Assessment: 65 y.o. male who presents to the emergency department today because of concerns for shortness of breath. Found to have COVID-19 infection. There is also concern for superimposed bacterial pneumonia. Pharmacy has been consulted for electrolyte replacement  Goal of Therapy:  Electrolytes within normal limits  Plan:  No replacement indicated at this time Recheck electrolytes with AM labs  Tawnya Crook, PharmD, BCPS Clinical Pharmacist 02/20/2021 12:15 PM

## 2021-02-20 NOTE — Progress Notes (Signed)
Initial Nutrition Assessment  DOCUMENTATION CODES:   Morbid obesity  INTERVENTION:   Vital HP @50ml /hr + ProSource TF 73ml five times daily   Propofol: 40.3 ml/hr- provides 1064kcal/day   Free water flushes 162ml q4 hours   Regimen provides 2664kcal/day, 215g/day protein and 1959ml/day free water   Pt at high refeed risk; recommend monitor potassium, magnesium and phosphorus labs daily until stable  Liquid MVI daily via tube   NUTRITION DIAGNOSIS:   Inadequate oral intake related to inability to eat (pt sedated and ventilated) as evidenced by NPO status.  GOAL:   Provide needs based on ASPEN/SCCM guidelines  MONITOR:   Vent status, Labs, Weight trends, TF tolerance, Skin, I & O's  REASON FOR ASSESSMENT:   Ventilator    ASSESSMENT:   65 y/o male with h/o PUD, OSA, HTN, substance abuse, CHF, Hep C and COPD who is admitted with COVID 19.  Pt sedated and ventilated. Plan is to start tube feeds today. Per chart, pt appears fairly weight stable at baseline.   Medications reviewed and include: vitamin C, colace, lovenox, insulin, protonix, miralax, solu-medrol, zinc, ceftriaxone, propofol   Labs reviewed: K 4.6 wnl, BUN 31(H), creat 1.38(H), P 5.5(H), Mg 2.4 wnl Cbgs- 128, 130, 148 x 24 hrs  Patient is currently intubated on ventilator support MV: 10.0 L/min Temp (24hrs), Avg:97.2 F (36.2 C), Min:97 F (36.1 C), Max:97.8 F (36.6 C)  Propofol: 40.3 ml/hr- provides 1064kcal/day   MAP- >28mmHg   UOP- 1138ml   NUTRITION - FOCUSED PHYSICAL EXAM:  Flowsheet Row Most Recent Value  Orbital Region No depletion  Upper Arm Region No depletion  Thoracic and Lumbar Region No depletion  Buccal Region No depletion  Temple Region No depletion  Clavicle Bone Region No depletion  Clavicle and Acromion Bone Region No depletion  Scapular Bone Region No depletion  Dorsal Hand No depletion  Patellar Region No depletion  Anterior Thigh Region No depletion  Posterior  Calf Region No depletion  Edema (RD Assessment) None  Hair Reviewed  Eyes Reviewed  Mouth Reviewed  Skin Reviewed  Nails Reviewed   Diet Order:   Diet Order             Diet NPO time specified  Diet effective now                  EDUCATION NEEDS:   No education needs have been identified at this time  Skin:  Skin Assessment: Reviewed RN Assessment  Last BM:  PTA  Height:   Ht Readings from Last 1 Encounters:  02/18/21 6\' 3"  (1.905 m)    Weight:   Wt Readings from Last 1 Encounters:  02/18/21 (!) 191.7 kg    Ideal Body Weight:  89 kg  BMI:  Body mass index is 52.82 kg/m.  Estimated Nutritional Needs:   Kcal:  2600kcal/day  Protein:  178-223g/day  Fluid:  2.7-3.0L/day  Koleen Distance MS, RD, LDN Please refer to [facility] for RD and/or RD on-call/weekend/after hours pager

## 2021-02-21 ENCOUNTER — Inpatient Hospital Stay
Admit: 2021-02-21 | Discharge: 2021-02-21 | Disposition: A | Discharge status: Home / Self Care | Payer: Medicaid Other | Attending: Pulmonary Disease | Admitting: Pulmonary Disease

## 2021-02-21 ENCOUNTER — Inpatient Hospital Stay: Payer: Medicaid Other

## 2021-02-21 DIAGNOSIS — J80 Acute respiratory distress syndrome: Secondary | ICD-10-CM

## 2021-02-21 DIAGNOSIS — E662 Morbid (severe) obesity with alveolar hypoventilation: Secondary | ICD-10-CM

## 2021-02-21 DIAGNOSIS — U071 COVID-19: Secondary | ICD-10-CM | POA: Diagnosis not present

## 2021-02-21 LAB — BLOOD GAS, ARTERIAL
Acid-Base Excess: 7.8 mmol/L — ABNORMAL HIGH (ref 0.0–2.0)
Acid-Base Excess: 12.2 mmol/L — ABNORMAL HIGH (ref 0.0–2.0)
Acid-Base Excess: 10.6 mmol/L — ABNORMAL HIGH (ref 0.0–2.0)
Bicarbonate: 40.1 mmol/L — ABNORMAL HIGH (ref 20.0–28.0)
Bicarbonate: 42.3 mmol/L — ABNORMAL HIGH (ref 20.0–28.0)
Bicarbonate: 41.3 mmol/L — ABNORMAL HIGH (ref 20.0–28.0)
FIO2: 1
FIO2: 1
FIO2: 1
MECHVT: 500 mL
MECHVT: 500 mL
MECHVT: 500 mL
Mechanical Rate: 20
Mechanical Rate: 28
Mechanical Rate: 28
O2 Saturation: 89.6 %
O2 Saturation: 94.8 %
O2 Saturation: 89 %
PEEP: 8 cmH2O
PEEP: 10 cmH2O
PEEP: 10 cmH2O
Patient temperature: 37
Patient temperature: 37
Patient temperature: 37
RATE: 28 resp/min
pCO2 arterial: 105 mmHg (ref 32.0–48.0)
pCO2 arterial: 86 mmHg (ref 32.0–48.0)
pCO2 arterial: 92 mmHg (ref 32.0–48.0)
pH, Arterial: 7.19 — CL (ref 7.350–7.450)
pH, Arterial: 7.3 — ABNORMAL LOW (ref 7.350–7.450)
pH, Arterial: 7.26 — ABNORMAL LOW (ref 7.350–7.450)
pO2, Arterial: 71 mmHg — ABNORMAL LOW (ref 83.0–108.0)
pO2, Arterial: 82 mmHg — ABNORMAL LOW (ref 83.0–108.0)
pO2, Arterial: 65 mmHg — ABNORMAL LOW (ref 83.0–108.0)

## 2021-02-21 LAB — COMPREHENSIVE METABOLIC PANEL WITH GFR
ALT: 15 U/L (ref 0–44)
AST: 11 U/L — ABNORMAL LOW (ref 15–41)
Albumin: 2.9 g/dL — ABNORMAL LOW (ref 3.5–5.0)
Alkaline Phosphatase: 90 U/L (ref 38–126)
Anion gap: 6 (ref 5–15)
BUN: 46 mg/dL — ABNORMAL HIGH (ref 8–23)
CO2: 36 mmol/L — ABNORMAL HIGH (ref 22–32)
Calcium: 7.8 mg/dL — ABNORMAL LOW (ref 8.9–10.3)
Chloride: 96 mmol/L — ABNORMAL LOW (ref 98–111)
Creatinine, Ser: 1.33 mg/dL — ABNORMAL HIGH (ref 0.61–1.24)
GFR, Estimated: >60 mL/min
Glucose, Bld: 136 mg/dL — ABNORMAL HIGH (ref 70–99)
Potassium: 4.7 mmol/L (ref 3.5–5.1)
Sodium: 138 mmol/L (ref 135–145)
Total Bilirubin: 0.6 mg/dL (ref 0.3–1.2)
Total Protein: 7.6 g/dL (ref 6.5–8.1)

## 2021-02-21 LAB — CBC WITH DIFFERENTIAL/PLATELET
Abs Immature Granulocytes: 0.1 10*3/uL — ABNORMAL HIGH (ref 0.00–0.07)
Basophils Absolute: 0 10*3/uL (ref 0.0–0.1)
Basophils Relative: 0 %
Eosinophils Absolute: 0 10*3/uL (ref 0.0–0.5)
Eosinophils Relative: 0 %
HCT: 49 % (ref 39.0–52.0)
Hemoglobin: 13.2 g/dL (ref 13.0–17.0)
Immature Granulocytes: 1 %
Lymphocytes Relative: 3 %
Lymphs Abs: 0.3 10*3/uL — ABNORMAL LOW (ref 0.7–4.0)
MCH: 23.4 pg — ABNORMAL LOW (ref 26.0–34.0)
MCHC: 26.9 g/dL — ABNORMAL LOW (ref 30.0–36.0)
MCV: 86.7 fL (ref 80.0–100.0)
Monocytes Absolute: 0.3 10*3/uL (ref 0.1–1.0)
Monocytes Relative: 3 %
Neutro Abs: 8.1 10*3/uL — ABNORMAL HIGH (ref 1.7–7.7)
Neutrophils Relative %: 93 %
Platelets: 139 10*3/uL — ABNORMAL LOW (ref 150–400)
RBC: 5.65 MIL/uL (ref 4.22–5.81)
RDW: 19.6 % — ABNORMAL HIGH (ref 11.5–15.5)
WBC: 8.7 10*3/uL (ref 4.0–10.5)
nRBC: 1.9 % — ABNORMAL HIGH (ref 0.0–0.2)

## 2021-02-21 LAB — CULTURE, RESPIRATORY W GRAM STAIN: Culture: NORMAL

## 2021-02-21 LAB — FERRITIN: Ferritin: 9 ng/mL — ABNORMAL LOW (ref 24–336)

## 2021-02-21 LAB — D-DIMER, QUANTITATIVE: D-Dimer, Quant: 2.19 ug/mL-FEU — ABNORMAL HIGH (ref 0.00–0.50)

## 2021-02-21 LAB — ECHOCARDIOGRAM COMPLETE
AR max vel: 2.02 cm2
AV Area VTI: 2.48 cm2
AV Area mean vel: 1.84 cm2
AV Mean grad: 8 mmHg
AV Peak grad: 14.7 mmHg
Ao pk vel: 1.92 m/s
Area-P 1/2: 4.24 cm2
Height: 75 in
MV VTI: 3.31 cm2
S' Lateral: 3.01 cm
Weight: 6761.95 [oz_av]

## 2021-02-21 LAB — BRAIN NATRIURETIC PEPTIDE: B Natriuretic Peptide: 31 pg/mL (ref 0.0–100.0)

## 2021-02-21 LAB — MAGNESIUM: Magnesium: 2.4 mg/dL (ref 1.7–2.4)

## 2021-02-21 LAB — GLUCOSE, CAPILLARY
Glucose-Capillary: 143 mg/dL — ABNORMAL HIGH (ref 70–99)
Glucose-Capillary: 136 mg/dL — ABNORMAL HIGH (ref 70–99)
Glucose-Capillary: 130 mg/dL — ABNORMAL HIGH (ref 70–99)
Glucose-Capillary: 155 mg/dL — ABNORMAL HIGH (ref 70–99)
Glucose-Capillary: 130 mg/dL — ABNORMAL HIGH (ref 70–99)

## 2021-02-21 LAB — C-REACTIVE PROTEIN: CRP: 1.4 mg/dL — ABNORMAL HIGH

## 2021-02-21 LAB — PHOSPHORUS: Phosphorus: 5.5 mg/dL — ABNORMAL HIGH (ref 2.5–4.6)

## 2021-02-21 MED ORDER — BARICITINIB 2 MG PO TABS
4.0000 mg | ORAL_TABLET | Freq: Every day | ORAL | Status: DC
  Administered 2021-02-21: 4 mg
  Filled 2021-02-21 (×2): qty 2

## 2021-02-21 MED ORDER — FUROSEMIDE 10 MG/ML IJ SOLN
40.0000 mg | Freq: Once | INTRAMUSCULAR | Status: AC
  Administered 2021-02-21: 40 mg via INTRAVENOUS
  Filled 2021-02-21: qty 4

## 2021-02-21 NOTE — Progress Notes (Signed)
Patient's sats dropped to the mid 50s prior to being moved to XL bed per nursing staff. Placed on 100 % FI02 with improvement to 90-91%. Breath sounds decreased with mild to moderate wheezing and rhonchi auscultated bilaterally.   Acute Hypoxic / Hypercapnic Respiratory Failure secondary to  PMHx: Obesity hypoventilation syndrome (Pickwickian syndrome) Increase FI02 requirment to 100% - STAT Arterial Blood Gas result:  pO2 71; pCO2 105; pH 7.19;  HCO3 40.1, %O2 Sat 89.6. - STAT chest xray obtained and showed - Ventilator settings Adjustment: PRVC  34mL/kg, 100 FiO2, 10 PEEP, Increase RR 28. continue ventilator support & lung protective strategies - Wean PEEP & FiO2 as tolerated, maintain SpO2 > 90% - Head of bed elevated 30 degrees, VAP protocol in place - Plateau pressures less than 30 cm H20  - Intermittent chest x-ray & ABG PRN - Daily WUA with SBT as tolerated  - Ensure adequate pulmonary hygiene  - Lasix 40 mg IV x 1 dose - Continue  Ceftriaxone - Steroids initiated: solu-medrol 125 mg BID  - Budesonide inhaler nebs BID, bronchodilators PRN - PAD protocol in place: continue Fentanyl & Propofol gtt     [name], DNP, CCRN, FNP-C, AGACNP-BC Acute Care Nurse Practitioner  [facility] Pager: [phone] [place] [facility]

## 2021-02-21 NOTE — Progress Notes (Signed)
Pt bed moved over, pt Spo2 dropped to mid 50's. Pt FIO2 was increased to 100%, pt suctioned X2 both without any secretions. Pt SPO2 increased to 90% and sustained. This RT and several RN's at bedside. NP came to bedside. ABG obtained and RR increased to 28 and Peep increased to 10, per NP Ouma. SPO2 92%. Pt was not transferred to new bed.

## 2021-02-21 NOTE — Progress Notes (Signed)
[place] for Electrolyte Monitoring and Replacement   Recent Labs: Potassium (mmol/L)  Date Value  02/21/2021 4.7  04/15/2014 3.9   Magnesium (mg/dL)  Date Value  02/21/2021 2.4  08/21/2012 1.9   Calcium (mg/dL)  Date Value  02/21/2021 7.8 (L)   Calcium, Total (mg/dL)  Date Value  04/15/2014 8.3 (L)   Albumin (g/dL)  Date Value  02/21/2021 2.9 (L)  04/15/2014 2.9 (L)   Phosphorus (mg/dL)  Date Value  02/21/2021 5.5 (H)   Sodium (mmol/L)  Date Value  02/21/2021 138  03/15/2020 145 (H)  04/15/2014 140    Assessment: 65 y.o. male who presents to the emergency department today because of concerns for shortness of breath. Found to have COVID-19 infection. There is also concern for superimposed bacterial pneumonia. Pharmacy has been consulted for electrolyte replacement  Goal of Therapy:  Electrolytes within normal limits  Plan:  No replacement indicated at this time Continue to follow along  Tawnya Crook, PharmD, BCPS Clinical Pharmacist 02/21/2021 2:11 PM

## 2021-02-21 NOTE — Progress Notes (Signed)
*  PRELIMINARY RESULTS* Echocardiogram 2D Echocardiogram has been performed.  Gary [name] 02/21/2021, 8:53 AM
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
## 2021-02-22 ENCOUNTER — Inpatient Hospital Stay: Payer: Medicaid Other

## 2021-02-22 DIAGNOSIS — U071 COVID-19: Secondary | ICD-10-CM | POA: Diagnosis not present

## 2021-02-22 DIAGNOSIS — J9601 Acute respiratory failure with hypoxia: Secondary | ICD-10-CM | POA: Diagnosis not present

## 2021-02-22 DIAGNOSIS — J9692 Respiratory failure, unspecified with hypercapnia: Secondary | ICD-10-CM | POA: Diagnosis not present

## 2021-02-22 LAB — GLUCOSE, CAPILLARY
Glucose-Capillary: 103 mg/dL — ABNORMAL HIGH (ref 70–99)
Glucose-Capillary: 112 mg/dL — ABNORMAL HIGH (ref 70–99)
Glucose-Capillary: 127 mg/dL — ABNORMAL HIGH (ref 70–99)
Glucose-Capillary: 129 mg/dL — ABNORMAL HIGH (ref 70–99)
Glucose-Capillary: 144 mg/dL — ABNORMAL HIGH (ref 70–99)
Glucose-Capillary: 95 mg/dL (ref 70–99)

## 2021-02-22 LAB — CBC WITH DIFFERENTIAL/PLATELET
Abs Immature Granulocytes: 0.07 10*3/uL (ref 0.00–0.07)
Basophils Absolute: 0 10*3/uL (ref 0.0–0.1)
Basophils Relative: 0 %
Eosinophils Absolute: 0 10*3/uL (ref 0.0–0.5)
Eosinophils Relative: 0 %
HCT: 45.1 % (ref 39.0–52.0)
Hemoglobin: 12.3 g/dL — ABNORMAL LOW (ref 13.0–17.0)
Immature Granulocytes: 1 %
Lymphocytes Relative: 12 %
Lymphs Abs: 1 10*3/uL (ref 0.7–4.0)
MCH: 23.2 pg — ABNORMAL LOW (ref 26.0–34.0)
MCHC: 27.3 g/dL — ABNORMAL LOW (ref 30.0–36.0)
MCV: 84.9 fL (ref 80.0–100.0)
Monocytes Absolute: 1 10*3/uL (ref 0.1–1.0)
Monocytes Relative: 12 %
Neutro Abs: 6 10*3/uL (ref 1.7–7.7)
Neutrophils Relative %: 75 %
Platelets: 137 10*3/uL — ABNORMAL LOW (ref 150–400)
RBC: 5.31 MIL/uL (ref 4.22–5.81)
RDW: 18.9 % — ABNORMAL HIGH (ref 11.5–15.5)
WBC: 8.1 10*3/uL (ref 4.0–10.5)
nRBC: 2.4 % — ABNORMAL HIGH (ref 0.0–0.2)

## 2021-02-22 LAB — COMPREHENSIVE METABOLIC PANEL
ALT: 17 U/L (ref 0–44)
AST: 14 U/L — ABNORMAL LOW (ref 15–41)
Albumin: 3 g/dL — ABNORMAL LOW (ref 3.5–5.0)
Alkaline Phosphatase: 75 U/L (ref 38–126)
Anion gap: 5 (ref 5–15)
BUN: 69 mg/dL — ABNORMAL HIGH (ref 8–23)
CO2: 37 mmol/L — ABNORMAL HIGH (ref 22–32)
Calcium: 7.3 mg/dL — ABNORMAL LOW (ref 8.9–10.3)
Chloride: 95 mmol/L — ABNORMAL LOW (ref 98–111)
Creatinine, Ser: 1.55 mg/dL — ABNORMAL HIGH (ref 0.61–1.24)
GFR, Estimated: 50 mL/min — ABNORMAL LOW (ref >60)
Glucose, Bld: 112 mg/dL — ABNORMAL HIGH (ref 70–99)
Potassium: 4.5 mmol/L (ref 3.5–5.1)
Sodium: 137 mmol/L (ref 135–145)
Total Bilirubin: 0.5 mg/dL (ref 0.3–1.2)
Total Protein: 7.2 g/dL (ref 6.5–8.1)

## 2021-02-22 LAB — FERRITIN: Ferritin: 9 ng/mL — ABNORMAL LOW (ref 24–336)

## 2021-02-22 LAB — C-REACTIVE PROTEIN: CRP: 1.1 mg/dL — ABNORMAL HIGH (ref <1.0)

## 2021-02-22 LAB — MAGNESIUM: Magnesium: 2.6 mg/dL — ABNORMAL HIGH (ref 1.7–2.4)

## 2021-02-22 LAB — TRIGLYCERIDES: Triglycerides: 98 mg/dL (ref <150)

## 2021-02-22 LAB — PHOSPHORUS: Phosphorus: 5.2 mg/dL — ABNORMAL HIGH (ref 2.5–4.6)

## 2021-02-22 LAB — D-DIMER, QUANTITATIVE: D-Dimer, Quant: 2.19 ug/mL-FEU — ABNORMAL HIGH (ref 0.00–0.50)

## 2021-02-22 MED ORDER — PREDNISONE 50 MG PO TABS: 50.0000 mg | ORAL_TABLET | Freq: Every day | ORAL | Status: DC

## 2021-02-22 MED ORDER — PANTOPRAZOLE SODIUM 40 MG IV SOLR
40.0000 mg | Freq: Every day | INTRAVENOUS | Status: DC
Start: 2021-02-22 — End: 2021-03-21

## 2021-02-22 MED ORDER — FENTANYL BOLUS VIA INFUSION
50.0000 ug | INTRAVENOUS | 0 refills | Status: DC | PRN
Start: 2021-02-22 — End: 2021-03-21

## 2021-02-22 MED ORDER — ADULT MULTIVITAMIN LIQUID CH: 15.0000 mL | Freq: Every day | ORAL | Status: DC

## 2021-02-22 MED ORDER — VITAL HIGH PROTEIN PO LIQD: 1000.0000 mL | ORAL | Status: DC

## 2021-02-22 MED ORDER — IPRATROPIUM-ALBUTEROL 0.5-2.5 (3) MG/3ML IN SOLN: 3.0000 mL | Freq: Four times a day (QID) | RESPIRATORY_TRACT | Status: DC

## 2021-02-22 MED ORDER — ENOXAPARIN SODIUM 100 MG/ML IJ SOSY
0.5000 mg/kg | PREFILLED_SYRINGE | INTRAMUSCULAR | Status: DC
  Filled 2021-02-22: qty 0.95

## 2021-02-22 MED ORDER — BUDESONIDE 0.25 MG/2ML IN SUSP: 0.2500 mg | Freq: Two times a day (BID) | RESPIRATORY_TRACT | 12 refills | Status: DC

## 2021-02-22 MED ORDER — FUROSEMIDE 10 MG/ML IJ SOLN
40.0000 mg | Freq: Once | INTRAMUSCULAR | Status: AC
  Administered 2021-02-22: 40 mg via INTRAVENOUS
  Filled 2021-02-22: qty 4

## 2021-02-22 MED ORDER — ASCORBIC ACID 500 MG PO TABS
500.0000 mg | ORAL_TABLET | Freq: Every day | ORAL | Status: DC
Start: 2021-02-23 — End: 2021-03-21

## 2021-02-22 MED ORDER — PROPOFOL 1000 MG/100ML IV EMUL: 5.0000 ug/kg/min | INTRAVENOUS | Status: DC

## 2021-02-22 MED ORDER — BARICITINIB 2 MG PO TABS: 2.0000 mg | ORAL_TABLET | Freq: Every day | ORAL | Status: DC

## 2021-02-22 MED ORDER — ENOXAPARIN SODIUM 100 MG/ML IJ SOSY
0.5000 mg/kg | PREFILLED_SYRINGE | INTRAMUSCULAR | Status: DC
Start: 2021-02-22 — End: 2021-03-21

## 2021-02-22 MED ORDER — BARICITINIB 2 MG PO TABS
2.0000 mg | ORAL_TABLET | Freq: Every day | ORAL | Status: DC
  Administered 2021-02-22: 2 mg
  Filled 2021-02-22 (×2): qty 1

## 2021-02-22 MED ORDER — ZINC SULFATE 220 (50 ZN) MG PO CAPS: 220.0000 mg | ORAL_CAPSULE | Freq: Every day | ORAL | Status: DC

## 2021-02-22 MED ORDER — PROSOURCE TF PO LIQD: 90.0000 mL | Freq: Every day | ORAL | Status: DC

## 2021-02-22 NOTE — Procedures (Addendum)
Arterial Catheter Insertion Procedure Note  Gary [name]  [id number]  12/26/1955  Date:02/22/21  Time:12:53 AM    Provider Performing: Karen Kays    Procedure: Insertion of Arterial Line [phone]) with US guidance (85631)   Indication(s) Blood pressure monitoring and/or need for frequent ABGs  Consent Unable to obtain consent due to emergent nature of procedure.  Anesthesia Topical Lidocaine   Time Out Verified patient identification, verified procedure, site/side was marked, verified correct patient position, special equipment/implants available, medications/allergies/relevant history reviewed, required imaging and test results available.   Sterile Technique Maximal sterile technique including full sterile barrier drape, hand hygiene, sterile gown, sterile gloves, mask, hair covering, sterile ultrasound probe cover (if used).   Procedure Description Area of catheter insertion was cleaned with chlorhexidine and draped in sterile fashion. With real-time ultrasound guidance an arterial catheter was placed into the right radial artery.  Appropriate arterial tracings confirmed on monitor.     Complications/Tolerance None; patient tolerated the procedure well.   EBL Minimal   Specimen(s) None   [name], DNP, CCRN, FNP-C, AGACNP-BC Acute Care Nurse Practitioner  [facility] Pager: [phone] [place] at [facility]
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
## 2021-02-22 NOTE — Progress Notes (Signed)
[facility] for Electrolyte Monitoring and Replacement   Recent Labs: Potassium (mmol/L)  Date Value  02/22/2021 4.5  04/15/2014 3.9   Magnesium (mg/dL)  Date Value  02/22/2021 2.6 (H)  08/21/2012 1.9   Calcium (mg/dL)  Date Value  02/22/2021 7.3 (L)   Calcium, Total (mg/dL)  Date Value  04/15/2014 8.3 (L)   Albumin (g/dL)  Date Value  02/22/2021 3.0 (L)  04/15/2014 2.9 (L)   Phosphorus (mg/dL)  Date Value  02/22/2021 5.2 (H)   Sodium (mmol/L)  Date Value  02/22/2021 137  03/15/2020 145 (H)  04/15/2014 140    Assessment: 65 y.o. male who presents to the emergency department today because of concerns for shortness of breath. Found to have COVID-19 infection. There is also concern for superimposed bacterial pneumonia. Pharmacy has been consulted for electrolyte replacement  Goal of Therapy:  Electrolytes within normal limits  Plan:  No replacement indicated at this time Continue to follow along  Tawnya Crook, PharmD, BCPS Clinical Pharmacist 02/22/2021 11:36 AM
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
## 2021-02-22 NOTE — TOC Initial Note (Signed)
Transition of Care [facility]) - Initial/Assessment Note    Patient Details  Name: Gary [name] MRN: [id number] Date of Birth: [birth year]  [facility] CM/SW Contact:    Kerin Salen, RN Phone Number: 02/22/2021, 3:29 PM  Clinical Narrative:  During rounds received call that [facility] accepted patient however no bed at this time.                       Patient Goals and CMS Choice        Expected Discharge Plan and Services                                                Prior Living Arrangements/Services                       Activities of Daily Living      Permission Sought/Granted                  Emotional Assessment              Admission diagnosis:  Acute on chronic respiratory failure ([name]) [J96.20] Respiratory failure with hypoxia and hypercapnia, unspecified chronicity ([place]) [J96.91, J96.92] Patient Active Problem List   Diagnosis Date Noted   Hyperlipidemia 02/19/2021   COVID-19 virus infection    Acute on chronic respiratory failure ([place]) 02/18/2021   Spondylosis without myelopathy or radiculopathy, lumbosacral region 11/15/2020   Lumbosacral facet joint syndrome (Bilateral) 10/31/2020   Generalized osteoarthritis of multiple sites 10/31/2020   AKI (acute kidney injury) ([place]) 03/04/2020   Hyperkalemia 03/04/2020   COPD with acute exacerbation ([place]) 03/02/2020   Marijuana use 01/17/2020   Elevated BUN 01/17/2020   Hypocalcemia 01/17/2020   Hypochloremia 01/17/2020   Low thyroid stimulating hormone (TSH) level 01/17/2020   Vitamin D deficiency 01/17/2020   Elevated sed rate 01/17/2020   Thrombocytopenia ([place]) 01/17/2020   Elevated hematocrit 01/17/2020   Elevated brain natriuretic peptide (BNP) level 01/17/2020   Positive hepatitis C antibody test 01/17/2020   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 mass index (BMI) of 50.0 to 59.9 in adult ([place]) 01/17/2020   Osteoarthritis of glenohumeral  joints (Bilateral) 01/17/2020   Osteoarthritis of AC (acromioclavicular) joints (Bilateral) 01/17/2020   Lumbar Grade 1 Anterolisthesis of L4 over L5 (3 mm) (stable) 01/17/2020   Osteoarthritis of lumbar spine (L3 through S1) 01/17/2020   Lumbar facet osteoarthritis (Multilevel) (Bilateral) 01/17/2020   Osteoarthritis of hips (Bilateral) 01/17/2020   Osteoarthritis of knees (Bilateral) 01/17/2020   Tricompartment osteoarthritis of knees (Bilateral) 01/17/2020   Patellofemoral arthralgia of knees (Bilateral) 01/17/2020   Plantar and Achilles calcaneal spurs (Bilateral) 01/17/2020   Ankle edema (Bilateral) 01/17/2020   Traumatic osteoarthritis of hands (Bilateral) 01/17/2020   Polysubstance abuse ([place]) 08/25/2019   Chronic pain syndrome 07/14/2019   Pharmacologic therapy 07/14/2019   Disorder of skeletal system 07/14/2019   Problems influencing health status 07/14/2019   Chronic low back pain (1ry area of Pain) (Bilateral) (L>R) w/o sciatica 07/14/2019   Chronic shoulder pain (2ry area of Pain) (Bilateral) (L>R) 07/14/2019   Chronic feet pain (3ry area of Pain) (Bilateral) 07/14/2019   Chronic knee pain (4th area of Pain) (Bilateral) (L>R) 07/14/2019   Chronic hip pain (5th area of Pain) (Bilateral) (L>R) 07/14/2019   Chronic hand pain (6th area of  Pain) (Bilateral) (L>R) 07/14/2019   Lumbar facet arthropathy 07/14/2019   DDD (degenerative disc disease), lumbosacral 07/14/2019   Closed fracture of shaft of humerus, sequela (Left) 07/14/2019   DDD (degenerative disc disease), cervical 07/14/2019   Tobacco use 05/04/2017   Lymphedema 05/04/2017   CHF (congestive heart failure) ([place]) 04/12/2017   Sleep apnea 02/28/2015   Edema of both feet    Closed fracture of scapula, sequela (Left) 04/01/2014   Morbid obesity with BMI of 50.0-59.9, adult ([place]) 04/01/2014   HTN (hypertension) 04/01/2014   Multiple rib fractures 03/27/2014   PCP:  [facility] [employer]:   [place], [place] - [address] [address] Phone: [phone] Fax: [phone]     Social Determinants of Health (SDOH) Interventions    Readmission Risk Interventions Readmission Risk Prevention Plan 03/01/2020 03/01/2020 03/01/2020  Transportation Screening Complete Complete Complete  PCP or Specialist Appt within 3-5 Days Not Complete Not Complete Not Complete  Not Complete comments To be scheduled by unit secretary will be scheduled by unit clerk To be scheduled by unit Secretary when discharged  [place] or [place] Complete Complete Complete  Social Work Consult for [place] Planning/Counseling Complete Complete Complete  Palliative Care Screening Not Applicable Not Applicable Not Applicable  Medication Review Press photographer) Complete Complete Complete  Some recent data might be hidden

## 2021-02-22 NOTE — Progress Notes (Addendum)
Pt remains on vent, 100% Peep 12 RR 28 TV 500.   Pt remains on Fent @ 200 and Prop @ 30. Pt raas -2 to -3. Moves all 4 ext to stimulation or turns.   Plan: Tx to [facility].   Report called to [facility] on [address] near 1730.   [facility] contacted, report given, official weight provided, and updated ABG provided near 1800 =   PH 7.31  CO2 89 O2 67 HC03 44.8   Tube feeds stopped and OG clamped in prep for transport.  Pt's VSS @ this time. Waiting for transport to arrive to [facility].

## 2021-02-22 NOTE — Progress Notes (Signed)
GOALS OF CARE DISCUSSION  The Clinical status was relayed to family in detail. Sister Gary [name] Updated and notified of patients medical condition.    Patient remains unresponsive and will not open eyes to command.   Patient is having a weak cough and struggling to remove secretions.   Patient with increased WOB and using accessory muscles to breathe Explained to family course of therapy and the modalities    Patient with Progressive multiorgan failure with a very high probablity of a very minimal chance of meaningful recovery despite all aggressive and optimal medical therapy.  PATIENT REMAINS FULL CODE  Family understands the situation.  Family are satisfied with Plan of action and management. All questions answered  Additional CC time 35 mins   Gary [name], M.D.  [facility]  Medical Director [place] Director [facility] Cardio-Pulmonary Department

## 2021-03-03 ENCOUNTER — Inpatient Hospital Stay
Admission: AD | Admit: 2021-03-03 | Payer: Medicaid Other | Source: Other Acute Inpatient Hospital | Admitting: Internal Medicine

## 2021-03-06 ENCOUNTER — Ambulatory Visit: Payer: Medicaid Other | Admitting: Cardiovascular Disease

## 2021-03-10 ENCOUNTER — Inpatient Hospital Stay
Admission: AD | Admit: 2021-03-10 | Discharge: 2021-03-21 | DRG: 177 | Disposition: A | Discharge status: Home / Self Care | Payer: Medicaid Other | Source: Other Acute Inpatient Hospital | Attending: Internal Medicine | Admitting: Internal Medicine

## 2021-03-10 DIAGNOSIS — J440 Chronic obstructive pulmonary disease with acute lower respiratory infection: Secondary | ICD-10-CM | POA: Diagnosis present

## 2021-03-10 DIAGNOSIS — G928 Other toxic encephalopathy: Secondary | ICD-10-CM | POA: Diagnosis present

## 2021-03-10 DIAGNOSIS — D696 Thrombocytopenia, unspecified: Secondary | ICD-10-CM | POA: Diagnosis present

## 2021-03-10 DIAGNOSIS — N179 Acute kidney failure, unspecified: Secondary | ICD-10-CM | POA: Diagnosis present

## 2021-03-10 DIAGNOSIS — I5033 Acute on chronic diastolic (congestive) heart failure: Secondary | ICD-10-CM

## 2021-03-10 DIAGNOSIS — J189 Pneumonia, unspecified organism: Secondary | ICD-10-CM | POA: Diagnosis present

## 2021-03-10 DIAGNOSIS — E8729 Other acidosis: Secondary | ICD-10-CM | POA: Diagnosis present

## 2021-03-10 DIAGNOSIS — M25572 Pain in left ankle and joints of left foot: Secondary | ICD-10-CM | POA: Diagnosis present

## 2021-03-10 DIAGNOSIS — U071 COVID-19: Secondary | ICD-10-CM

## 2021-03-10 DIAGNOSIS — Z87891 Personal history of nicotine dependence: Secondary | ICD-10-CM

## 2021-03-10 DIAGNOSIS — I10 Essential (primary) hypertension: Secondary | ICD-10-CM | POA: Diagnosis present

## 2021-03-10 DIAGNOSIS — I5082 Biventricular heart failure: Secondary | ICD-10-CM | POA: Diagnosis present

## 2021-03-10 DIAGNOSIS — J13 Pneumonia due to Streptococcus pneumoniae: Secondary | ICD-10-CM | POA: Diagnosis present

## 2021-03-10 DIAGNOSIS — J9690 Respiratory failure, unspecified, unspecified whether with hypoxia or hypercapnia: Secondary | ICD-10-CM

## 2021-03-10 DIAGNOSIS — I959 Hypotension, unspecified: Secondary | ICD-10-CM | POA: Diagnosis present

## 2021-03-10 DIAGNOSIS — J9602 Acute respiratory failure with hypercapnia: Secondary | ICD-10-CM | POA: Diagnosis present

## 2021-03-10 DIAGNOSIS — E662 Morbid (severe) obesity with alveolar hypoventilation: Secondary | ICD-10-CM | POA: Diagnosis present

## 2021-03-10 DIAGNOSIS — E611 Iron deficiency: Secondary | ICD-10-CM | POA: Diagnosis present

## 2021-03-10 DIAGNOSIS — G4733 Obstructive sleep apnea (adult) (pediatric): Secondary | ICD-10-CM

## 2021-03-10 DIAGNOSIS — G4730 Sleep apnea, unspecified: Secondary | ICD-10-CM

## 2021-03-10 DIAGNOSIS — I5043 Acute on chronic combined systolic (congestive) and diastolic (congestive) heart failure: Secondary | ICD-10-CM | POA: Diagnosis present

## 2021-03-10 DIAGNOSIS — I2781 Cor pulmonale (chronic): Secondary | ICD-10-CM | POA: Diagnosis present

## 2021-03-10 DIAGNOSIS — J449 Chronic obstructive pulmonary disease, unspecified: Secondary | ICD-10-CM

## 2021-03-10 DIAGNOSIS — R531 Weakness: Secondary | ICD-10-CM

## 2021-03-10 DIAGNOSIS — Z79899 Other long term (current) drug therapy: Secondary | ICD-10-CM

## 2021-03-10 DIAGNOSIS — J9611 Chronic respiratory failure with hypoxia: Secondary | ICD-10-CM | POA: Diagnosis present

## 2021-03-10 DIAGNOSIS — Z9981 Dependence on supplemental oxygen: Secondary | ICD-10-CM

## 2021-03-10 DIAGNOSIS — J80 Acute respiratory distress syndrome: Secondary | ICD-10-CM | POA: Diagnosis present

## 2021-03-10 DIAGNOSIS — Z6841 Body Mass Index (BMI) 40.0 and over, adult: Secondary | ICD-10-CM

## 2021-03-10 DIAGNOSIS — I5030 Unspecified diastolic (congestive) heart failure: Secondary | ICD-10-CM

## 2021-03-10 DIAGNOSIS — J9601 Acute respiratory failure with hypoxia: Secondary | ICD-10-CM | POA: Diagnosis present

## 2021-03-10 DIAGNOSIS — J1282 Pneumonia due to coronavirus disease 2019: Secondary | ICD-10-CM | POA: Diagnosis present

## 2021-03-10 DIAGNOSIS — J441 Chronic obstructive pulmonary disease with (acute) exacerbation: Secondary | ICD-10-CM | POA: Diagnosis present

## 2021-03-10 DIAGNOSIS — I110 Hypertensive heart disease with heart failure: Principal | ICD-10-CM | POA: Diagnosis present

## 2021-03-11 ENCOUNTER — Other Ambulatory Visit: Payer: Self-pay

## 2021-03-11 ENCOUNTER — Encounter: Payer: Self-pay | Admitting: Internal Medicine

## 2021-03-11 DIAGNOSIS — J189 Pneumonia, unspecified organism: Secondary | ICD-10-CM | POA: Diagnosis present

## 2021-03-11 DIAGNOSIS — G928 Other toxic encephalopathy: Secondary | ICD-10-CM | POA: Diagnosis present

## 2021-03-11 DIAGNOSIS — D696 Thrombocytopenia, unspecified: Secondary | ICD-10-CM | POA: Diagnosis present

## 2021-03-11 DIAGNOSIS — J9601 Acute respiratory failure with hypoxia: Secondary | ICD-10-CM | POA: Diagnosis not present

## 2021-03-11 DIAGNOSIS — J440 Chronic obstructive pulmonary disease with acute lower respiratory infection: Secondary | ICD-10-CM | POA: Diagnosis present

## 2021-03-11 DIAGNOSIS — E611 Iron deficiency: Secondary | ICD-10-CM | POA: Diagnosis present

## 2021-03-11 DIAGNOSIS — Z6841 Body Mass Index (BMI) 40.0 and over, adult: Secondary | ICD-10-CM | POA: Diagnosis not present

## 2021-03-11 DIAGNOSIS — J9611 Chronic respiratory failure with hypoxia: Secondary | ICD-10-CM | POA: Diagnosis present

## 2021-03-11 DIAGNOSIS — Z9981 Dependence on supplemental oxygen: Secondary | ICD-10-CM | POA: Diagnosis not present

## 2021-03-11 DIAGNOSIS — I2781 Cor pulmonale (chronic): Secondary | ICD-10-CM | POA: Diagnosis present

## 2021-03-11 DIAGNOSIS — J441 Chronic obstructive pulmonary disease with (acute) exacerbation: Secondary | ICD-10-CM | POA: Diagnosis present

## 2021-03-11 DIAGNOSIS — U071 COVID-19: Secondary | ICD-10-CM | POA: Diagnosis present

## 2021-03-11 DIAGNOSIS — I5030 Unspecified diastolic (congestive) heart failure: Secondary | ICD-10-CM

## 2021-03-11 DIAGNOSIS — J1282 Pneumonia due to coronavirus disease 2019: Secondary | ICD-10-CM | POA: Diagnosis present

## 2021-03-11 DIAGNOSIS — J449 Chronic obstructive pulmonary disease, unspecified: Secondary | ICD-10-CM | POA: Diagnosis not present

## 2021-03-11 DIAGNOSIS — N179 Acute kidney failure, unspecified: Secondary | ICD-10-CM | POA: Diagnosis present

## 2021-03-11 DIAGNOSIS — I10 Essential (primary) hypertension: Secondary | ICD-10-CM | POA: Diagnosis not present

## 2021-03-11 DIAGNOSIS — J9602 Acute respiratory failure with hypercapnia: Secondary | ICD-10-CM | POA: Diagnosis present

## 2021-03-11 DIAGNOSIS — J80 Acute respiratory distress syndrome: Secondary | ICD-10-CM | POA: Diagnosis present

## 2021-03-11 DIAGNOSIS — I5033 Acute on chronic diastolic (congestive) heart failure: Secondary | ICD-10-CM | POA: Diagnosis not present

## 2021-03-11 DIAGNOSIS — I110 Hypertensive heart disease with heart failure: Secondary | ICD-10-CM | POA: Diagnosis present

## 2021-03-11 DIAGNOSIS — Z87891 Personal history of nicotine dependence: Secondary | ICD-10-CM | POA: Diagnosis not present

## 2021-03-11 DIAGNOSIS — I5043 Acute on chronic combined systolic (congestive) and diastolic (congestive) heart failure: Secondary | ICD-10-CM | POA: Diagnosis present

## 2021-03-11 DIAGNOSIS — Z79899 Other long term (current) drug therapy: Secondary | ICD-10-CM | POA: Diagnosis not present

## 2021-03-11 DIAGNOSIS — I5032 Chronic diastolic (congestive) heart failure: Secondary | ICD-10-CM

## 2021-03-11 DIAGNOSIS — E8729 Other acidosis: Secondary | ICD-10-CM | POA: Diagnosis present

## 2021-03-11 DIAGNOSIS — I5082 Biventricular heart failure: Secondary | ICD-10-CM | POA: Diagnosis present

## 2021-03-11 DIAGNOSIS — E662 Morbid (severe) obesity with alveolar hypoventilation: Secondary | ICD-10-CM | POA: Diagnosis present

## 2021-03-11 DIAGNOSIS — M25572 Pain in left ankle and joints of left foot: Secondary | ICD-10-CM | POA: Diagnosis present

## 2021-03-11 DIAGNOSIS — J13 Pneumonia due to Streptococcus pneumoniae: Secondary | ICD-10-CM | POA: Diagnosis present

## 2021-03-11 LAB — BASIC METABOLIC PANEL
Anion gap: 7 (ref 5–15)
BUN: 34 mg/dL — ABNORMAL HIGH (ref 8–23)
CO2: 40 mmol/L — ABNORMAL HIGH (ref 22–32)
Calcium: 8.7 mg/dL — ABNORMAL LOW (ref 8.9–10.3)
Chloride: 91 mmol/L — ABNORMAL LOW (ref 98–111)
Creatinine, Ser: 1.05 mg/dL (ref 0.61–1.24)
GFR, Estimated: >60 mL/min (ref >60)
Glucose, Bld: 107 mg/dL — ABNORMAL HIGH (ref 70–99)
Potassium: 3.6 mmol/L (ref 3.5–5.1)
Sodium: 138 mmol/L (ref 135–145)

## 2021-03-11 LAB — CBC
HCT: 43.4 % (ref 39.0–52.0)
Hemoglobin: 12.3 g/dL — ABNORMAL LOW (ref 13.0–17.0)
MCH: 23.5 pg — ABNORMAL LOW (ref 26.0–34.0)
MCHC: 28.3 g/dL — ABNORMAL LOW (ref 30.0–36.0)
MCV: 82.8 fL (ref 80.0–100.0)
Platelets: 262 10*3/uL (ref 150–400)
RBC: 5.24 MIL/uL (ref 4.22–5.81)
RDW: 16.8 % — ABNORMAL HIGH (ref 11.5–15.5)
WBC: 8.7 10*3/uL (ref 4.0–10.5)
nRBC: 0.6 % — ABNORMAL HIGH (ref 0.0–0.2)

## 2021-03-11 MED ORDER — PANTOPRAZOLE SODIUM 40 MG PO TBEC
40.0000 mg | DELAYED_RELEASE_TABLET | Freq: Every day | ORAL | Status: DC
  Administered 2021-03-11 – 2021-03-21 (×10): 40 mg via ORAL
  Filled 2021-03-11 (×10): qty 1

## 2021-03-11 MED ORDER — ACETAMINOPHEN 650 MG RE SUPP
650.0000 mg | Freq: Four times a day (QID) | RECTAL | Status: DC | PRN
  Filled 2021-03-11: qty 1

## 2021-03-11 MED ORDER — TORSEMIDE 20 MG PO TABS
40.0000 mg | ORAL_TABLET | Freq: Every day | ORAL | Status: DC
  Administered 2021-03-12 – 2021-03-21 (×9): 40 mg via ORAL
  Filled 2021-03-11 (×9): qty 2

## 2021-03-11 MED ORDER — FERROUS SULFATE 325 (65 FE) MG PO TABS
325.0000 mg | ORAL_TABLET | Freq: Two times a day (BID) | ORAL | Status: DC
  Administered 2021-03-11 – 2021-03-21 (×19): 325 mg via ORAL
  Filled 2021-03-11 (×18): qty 1

## 2021-03-11 MED ORDER — ONDANSETRON HCL 4 MG/2ML IJ SOLN: 4.0000 mg | Freq: Four times a day (QID) | INTRAMUSCULAR | Status: DC | PRN

## 2021-03-11 MED ORDER — NYSTATIN 100000 UNIT/ML MT SUSP
5.0000 mL | Freq: Four times a day (QID) | OROMUCOSAL | Status: DC
  Administered 2021-03-11 – 2021-03-21 (×33): 500000 [IU] via ORAL
  Filled 2021-03-11 (×34): qty 5

## 2021-03-11 MED ORDER — BUDESONIDE 0.25 MG/2ML IN SUSP
0.2500 mg | Freq: Two times a day (BID) | RESPIRATORY_TRACT | Status: DC
  Administered 2021-03-11 – 2021-03-19 (×16): 0.25 mg via RESPIRATORY_TRACT
  Filled 2021-03-11 (×15): qty 2

## 2021-03-11 MED ORDER — UMECLIDINIUM-VILANTEROL 62.5-25 MCG/ACT IN AEPB
1.0000 | INHALATION_SPRAY | Freq: Every day | RESPIRATORY_TRACT | Status: DC
  Administered 2021-03-11 – 2021-03-16 (×6): 1 via RESPIRATORY_TRACT
  Filled 2021-03-11: qty 14

## 2021-03-11 MED ORDER — IPRATROPIUM-ALBUTEROL 0.5-2.5 (3) MG/3ML IN SOLN
3.0000 mL | Freq: Four times a day (QID) | RESPIRATORY_TRACT | Status: DC | PRN
  Filled 2021-03-11: qty 3

## 2021-03-11 MED ORDER — SODIUM CHLORIDE 0.9 % IV SOLN
2.0000 g | INTRAVENOUS | Status: AC
  Administered 2021-03-11 – 2021-03-12 (×2): 2 g via INTRAVENOUS
  Filled 2021-03-11 (×2): qty 20

## 2021-03-11 MED ORDER — SIMVASTATIN 20 MG PO TABS
10.0000 mg | ORAL_TABLET | Freq: Every day | ORAL | Status: DC
  Administered 2021-03-11 – 2021-03-20 (×10): 10 mg via ORAL
  Filled 2021-03-11 (×9): qty 1

## 2021-03-11 MED ORDER — ACETAMINOPHEN 325 MG PO TABS
650.0000 mg | ORAL_TABLET | Freq: Four times a day (QID) | ORAL | Status: DC | PRN
  Administered 2021-03-14 – 2021-03-21 (×8): 650 mg via ORAL
  Filled 2021-03-11 (×8): qty 2

## 2021-03-11 MED ORDER — ENOXAPARIN SODIUM 100 MG/ML IJ SOSY
0.5000 mg/kg | PREFILLED_SYRINGE | INTRAMUSCULAR | Status: DC
  Administered 2021-03-11 – 2021-03-18 (×8): 92.5 mg via SUBCUTANEOUS
  Filled 2021-03-11 (×9): qty 0.93

## 2021-03-11 MED ORDER — ONDANSETRON HCL 4 MG PO TABS: 4.0000 mg | ORAL_TABLET | Freq: Four times a day (QID) | ORAL | Status: DC | PRN

## 2021-03-11 MED ORDER — TORSEMIDE 20 MG PO TABS
40.0000 mg | ORAL_TABLET | Freq: Two times a day (BID) | ORAL | Status: DC
  Administered 2021-03-11: 40 mg via ORAL
  Filled 2021-03-11: qty 2

## 2021-03-11 MED ORDER — HALOPERIDOL LACTATE 5 MG/ML IJ SOLN: 2.0000 mg | Freq: Four times a day (QID) | INTRAMUSCULAR | Status: DC | PRN
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
## 2021-03-11 NOTE — Plan of Care (Signed)
  Problem: Education: Goal: Knowledge of General Education information will improve Description Including pain rating scale, medication(s)/side effects and non-pharmacologic comfort measures Outcome: Progressing   

## 2021-03-11 NOTE — Progress Notes (Signed)
Anticoagulation monitoring(Lovenox):  65 yo male ordered Lovenox 40 mg Q24h    Filed Weights   03/11/21 0037  Weight: (!) 187.2 kg (412 lb 11.2 oz)   BMI 51.58    Lab Results  Component Value Date   CREATININE 1.55 (H) 02/22/2021   CREATININE 1.33 (H) 02/21/2021   CREATININE 1.38 (H) 02/20/2021   Estimated Creatinine Clearance: 86.7 mL/min (A) (by C-G formula based on SCr of 1.55 mg/dL (H)). Hemoglobin & Hematocrit     Component Value Date/Time   HGB 12.3 (L) 02/22/2021 0545   HGB 13.5 04/15/2014 1240   HCT 45.1 02/22/2021 0545   HCT 44.6 04/15/2014 1240     Per Protocol for Patient with estCrcl > 30 ml/min and BMI > 30, will transition to Lovenox 92.5 mg Q24h.

## 2021-03-11 NOTE — Evaluation (Signed)
Physical Therapy Evaluation Patient Details Name: Gary [name] MRN: [id number] DOB: [birth year] Today's Date: 03/11/2021  History of Present Illness  Gary [name] is a 65 y.o. male with medical history significant for COPD on home O2, morbid obesity, HFpEF, grade 3 DD, history of GI bleed OSA on nighttime BiPAP, HTN, transferred back from [place] on 03/11/21 for continued medical management ongoing medical problems patient was transferred to [facility] on 11/8 for ARDS secondary to severe COVID-19.  He was successfully extubated on 11/13 and has been gradually improving.   Clinical Impression  Patient sitting at edge of bed upon arrival, eager to participate in PT. Additional time was needed during session to locate appropriate equipment for patient's weight and get assistance with bed mobility and BM cleanup. Patient lives in a single story home with one step to enter with his wife. 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 Upon PT evaluation, patient required mod A to go supine to sit with use of B UE at bedrails and physical assistance to lift B LE to the bed. He required Max A +2 with a draw sheet to boost up in bed. Patient required CGA to perform 1x6 sit <> stand  to RW at edge of elevated bed (to mid thighs) with CGA and declined to attempt sit <> stand with bed any lower. He was unable to take steps while standing and tolerated standing for up to 15 seconds. SpO2 was in the low 80s on 2L/min so oxygen was titrated up to 4L/min with RN permission, resulting in improved SpO2 readings of mid 90s during stand attempts and as low as 87% when resting between attempts. Patient has experienced a decline in functional strength and independence and currently requires heavy assistance that makes it unsafe for him to return home in this condition. He requires short term rehabilitation prior to returning home for safety. Patient would  benefit from skilled physical therapy to address impairments and functional limitations (see PT Problem List below) to work towards stated goals and return to PLOF or maximal functional independence.       Recommendations for follow up therapy are one component of a multi-disciplinary discharge planning process, led by the attending physician.  Recommendations may be updated based on patient status, additional functional criteria and insurance authorization.  Follow Up Recommendations Skilled nursing-short term rehab (<3 hours/day)    Assistance Recommended at Discharge Intermittent Supervision/Assistance  Functional Status Assessment Patient has had a recent decline in their functional status and demonstrates the ability to make significant improvements in function in a reasonable and predictable amount of time.  Equipment Recommendations  Other (comment) (to be determined in next venue of care. If he were to discharge home now he would need a lift, hospital bed, wheelchair, and BSC)    Recommendations for Other Services       Precautions / Restrictions Precautions Precautions: Fall Restrictions Weight Bearing Restrictions: No      Mobility  Bed Mobility Overal bed mobility: Needs Assistance Bed Mobility: Sit to Supine       Sit to supine: Min assist;HOB elevated   General bed mobility comments: Patient required use of B UE on bed handrails and assistance from PT to lift his B LE up to the bed. He was max A + 2 with draw sheet for boosting up bed but was able to assist with B UE and B LE.  Patient was found sitting on edge of bed at start of session and stated he got there by himself.    Transfers Overall transfer level: Needs assistance Equipment used: Rolling walker (2 wheels) Transfers: Sit to/from Stand Sit to Stand: From elevated surface;Min guard           General transfer comment: Patient performed sit <> stand 1x6 at edge of elevated (to mid thigh) bed with B UE  on bariatric RW placed in front of him with CGA for safety. He was able to stand for 5-15 seconds at a time and failed to reach back as requested when going to sit. Patient attempted sideways shuffle at last attempt with minimal success. SpO2 increased to mid 90s with stand attempts and dropped to 88-90% when he rested between attempts. Titrated O2 to 4L/min as authorized by [employer].    Ambulation/Gait             Pre-gait activities: standing at edge of bed with RW and CGA 5-15 second bouts. Attempted to take steps in place unsuccessfully. General Gait Details: patient currently unable to take steps  Stairs            Wheelchair Mobility    Modified Rankin (Stroke Patients Only)       Balance Overall balance assessment: Needs assistance Sitting-balance support: No upper extremity supported;Feet unsupported Sitting balance-[name] Scale: Good Sitting balance - Comments: steady sitting at edge of bed   Standing balance support: Reliant on assistive device for balance;Bilateral upper extremity supported Standing balance-[name] Scale: Poor Standing balance comment: able to stand for 5-15 seconds                             Pertinent Vitals/Pain Pain Assessment: 0-10 Pain Score: 6  ("not that bad, 5-6/10 back pain") Facial Expression: Relaxed, neutral Body Movements: Absence of movements Muscle Tension: Relaxed Pain Location: low back Pain Descriptors / Indicators: Aching Pain Intervention(s): Limited activity within patient's tolerance;Monitored during session;Repositioned    Home Living Family/patient expects to be discharged to:: Private residence Living Arrangements: Spouse/significant other Available Help at Discharge: Available 24 hours/day (Aide comes 3 days a week for 4-5 hours) Type of Home: Apartment Home Access: Stairs to enter Entrance Stairs-Rails: None Entrance Stairs-Number of Steps: 1   Home Layout: One level Home Equipment: Conservation officer, nature (2  wheels);Cane - single point;BSC/3in1 Additional Comments: Oxygen Cpap, Portable oxygen. He used to wear 2L/min O2 at night.    Prior Function Prior Level of Function : Working/employed;Driving;Independent/Modified Independent             Mobility Comments: Pateint reports he was getting around well and going to work 4 days a week prior to hospitalization. ADLs Comments: Was fully I prior to hospitalization and working 4 days a week. 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     Hand Dominance   Dominant Hand: Right    Extremity/Trunk Assessment   Upper Extremity Assessment Upper Extremity Assessment: Generalized weakness    Lower Extremity Assessment Lower Extremity Assessment: Generalized weakness    Cervical / Trunk Assessment Cervical / Trunk Assessment: Normal  Communication   Communication: No difficulties  Cognition Arousal/Alertness: Awake/alert Behavior During Therapy: WFL for tasks assessed/performed Overall Cognitive Status: Within Functional Limits for tasks assessed  General Comments General comments (skin integrity, edema, etc.): SpO2 sat down to 83% on 2L/min as found when PT entered room. Titrated up to 4L/min during session as approved by RN and SpO2 came up and ranged from 87% to 96%, better during activity, worse when resting after.    Exercises Other Exercises Other Exercises: educated patient on proper transfer mechanics and hand placement, practiced pursed lip breathing, pt assisted with BM clean up, pt educated on discharge reccomendations and role of PT in acute care setting. Other Exercises: patient practiced sit <> stand from elevated surface 1x6 times and standing tolerance (5-15 seconds per rep).   Assessment/Plan    PT Assessment Patient needs continued PT services  PT Problem List Decreased strength;Cardiopulmonary status limiting activity;Pain;Decreased range of motion;Decreased  activity tolerance;Decreased knowledge of use of DME;Decreased balance;Obesity;Decreased mobility       PT Treatment Interventions DME instruction;Balance training;Gait training;Stair training;Functional mobility training;Therapeutic activities;Therapeutic exercise;Patient/family education;Neuromuscular re-education    PT Goals (Current goals can be found in the Care Plan section)  Acute Rehab PT Goals Patient Stated Goal: to go home PT Goal Formulation: With patient Time For Goal Achievement: 03/25/21 Potential to Achieve Goals: Fair    Frequency Min 2X/week   Barriers to discharge Other (comment) patient is currently unable to transfer surface to surface or ambulate without a lift    Co-evaluation               AM-PAC PT "6 Clicks" Mobility  Outcome Measure Help needed turning from your back to your side while in a flat bed without using bedrails?: A Lot Help needed moving from lying on your back to sitting on the side of a flat bed without using bedrails?: A Lot Help needed moving to and from a bed to a chair (including a wheelchair)?: Total Help needed standing up from a chair using your arms (e.g., wheelchair or bedside chair)?: A Lot Help needed to walk in hospital room?: Total Help needed climbing 3-5 steps with a railing? : Total 6 Click Score: 9    End of Session Equipment Utilized During Treatment: Gait belt;Oxygen (2-4L/min) Activity Tolerance: Patient limited by fatigue;Patient tolerated treatment well Patient left: in bed;with nursing/sitter in room   PT Visit Diagnosis: Unsteadiness on feet (R26.81);Muscle weakness (generalized) (M62.81);Difficulty in walking, not elsewhere classified (R26.2)    Time: 1552-0802 PT Time Calculation (min) (ACUTE ONLY): 63 min   Charges:   PT Evaluation $PT Eval Moderate Complexity: 1 Mod PT Treatments $Therapeutic Activity: 23-37 mins        Everlean Alstrom. Graylon Good, PT, DPT 03/11/21, 12:33 PM

## 2021-03-11 NOTE — Progress Notes (Signed)
[facility] PLAN OF CARE NOTE Patient: Gary [name] WLN:[id number]   PCP: [facility] [place] DOB: 1955-07-16   DOA: 03/10/2021   DOS: 03/11/2021    Patient was admitted by my colleague earlier on 03/11/2021. I have reviewed the H&P as well as assessment and plan and agree with the same. Important changes in the plan are listed below.  Plan of care: Principal Problem:   Chronic respiratory failure with hypoxia ([place]) Active Problems:   Morbid obesity with BMI of 50.0-59.9, adult ([name])   HTN (hypertension)   OSA (obstructive sleep apnea)   COPD (chronic obstructive pulmonary disease) ([name])   (HFpEF) heart failure with preserved ejection fraction ([name])   Pneumonia   Acute respiratory failure with hypoxia and hypercapnia ([place]) I have reviewed discharge summary from [facility] medication have been ordered as recommended. TOC has been consulted as well. PT consulted as well. Patient is volume overloaded.  Increase dose of torsemide to twice a day.  Author: Berle Mull, MD [facility] 03/11/2021 7:16 PM   If 7PM-7AM, please contact night-coverage at [email or web address]

## 2021-03-11 NOTE — Evaluation (Signed)
Occupational Therapy Evaluation Patient Details Name: Gary [name] MRN: [id number] DOB: 1955-10-12 Today's Date: 03/11/2021   History of Present Illness Gary [name] is a 65 y.o. male with medical history significant for COPD on home O2, morbid obesity, HFpEF, grade 3 DD, history of GI bleed OSA on nighttime BiPAP, HTN, transferred back from [place] on 03/11/21 for continued medical management ongoing medical problems patient was transferred to [facility] on 11/8 for ARDS secondary to severe COVID-19.  He was successfully extubated on 11/13 and has been gradually improving.   Clinical Impression   Pt seen for OT evaluation this date in setting of acute hospitalization d/t respiratory failure. Pt reports being MOD I for fxl mobility at baseline and requiring assist for LB dressing from spouse, but otherwise INDEP with ADLs. Pt requires MOD A for lateral scoot transfers. Pt declines to stand with OT at this time citing fatigue. OT engages pt in attempts at LB dressing in sitting with modified technique with MAX A. Pt educated on importance of OOB Activity and particularly core strengthening and engages pt in seated knee/hip flexion while engaging lower abdominals x5 for example and pt with good understanding and participation. Pt left seated EOB with tray SETUP for dinner at end of session. Will continue to follow acutely. Anticipate pt will require STR f/u OT services.      Recommendations for follow up therapy are one component of a multi-disciplinary discharge planning process, led by the attending physician.  Recommendations may be updated based on patient status, additional functional criteria and insurance authorization.   Follow Up Recommendations  Skilled nursing-short term rehab (<3 hours/day)    Assistance Recommended at Discharge Frequent or constant Supervision/Assistance  Functional Status Assessment  Patient has had a recent decline in their functional status and demonstrates the ability  to make significant improvements in function in a reasonable and predictable amount of time.  Equipment Recommendations  Other (comment) (defer)    Recommendations for Other Services       Precautions / Restrictions Precautions Precautions: Fall Restrictions Weight Bearing Restrictions: No      Mobility Bed Mobility               General bed mobility comments: up on side of bed before/after    Transfers Overall transfer level: Needs assistance   Transfers: Bed to chair/wheelchair/BSC            Lateral/Scoot Transfers: Mod assist General transfer comment: lateral scoot while seated EOB      Balance Overall balance assessment: Needs assistance Sitting-balance support: No upper extremity supported;Feet unsupported Sitting balance-[name] Scale: Good Sitting balance - Comments: steady sitting at edge of bed       Standing balance comment: deferred                           ADL either performed or assessed with clinical judgement   ADL                                         General ADL Comments: Pt requires SETUP for seated UB ADLs, MAX A for seated LB ADLs, MOD A for lateral scoot transfer     Vision         Perception     Praxis      Pertinent Vitals/Pain Pain Assessment: 0-10 Pain Score: 5  Pain Location: low back Pain Descriptors / Indicators: Aching Pain Intervention(s): Limited activity within patient's tolerance;Monitored during session;Repositioned     Hand Dominance Right   Extremity/Trunk Assessment Upper Extremity Assessment Upper Extremity Assessment: Generalized weakness   Lower Extremity Assessment Lower Extremity Assessment: Generalized weakness       Communication Communication Communication: No difficulties   Cognition Arousal/Alertness: Awake/alert Behavior During Therapy: WFL for tasks assessed/performed Overall Cognitive Status: Within Functional Limits for tasks assessed                                        General Comments       Exercises Other Exercises Other Exercises: Ed re: role of OT in acute setting.   Shoulder Instructions      Home Living Family/patient expects to be discharged to:: Private residence Living Arrangements: Spouse/significant other Available Help at Discharge: Available 24 hours/day (Aide comes 3 days a week for 4-5 hours) Type of Home: Apartment Home Access: Stairs to enter [employer] of Steps: 1 Entrance Stairs-Rails: None Home Layout: One level     Bathroom Shower/Tub: Teacher, early years/pre: Handicapped height     Home Equipment: Conservation officer, nature (2 wheels);Cane - single point;BSC/3in1   Additional Comments: Oxygen Cpap, Portable oxygen. He used to wear 2L/min O2 at night.      Prior Functioning/Environment Prior Level of Function : Working/employed;Driving;Independent/Modified Independent             Mobility Comments: Pateint reports he was getting around well and going to work 4 days a week prior to hospitalization. ADLs Comments: Was fully I prior to hospitalization and working 4 days a week. 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 DOes endorse that spouse had to assist with IADLs in the home and LB dressing        OT Problem List: Decreased strength;Decreased activity tolerance;Obesity;Cardiopulmonary status limiting activity      OT Treatment/Interventions: Self-care/ADL training;Therapeutic exercise;DME and/or AE instruction;Therapeutic activities    OT Goals(Current goals can be found in the care plan section) Acute Rehab OT Goals Patient Stated Goal: to go home OT Goal Formulation: With patient Time For Goal Achievement: 03/25/21 Potential to Achieve Goals: Good ADL Goals Pt Will Perform Grooming: with supervision;standing (sink-side with LRAD to complete 1-2 g/h tasks to increase standing tolernace) Pt Will Transfer to Toilet: ambulating;with min guard  assist;with supervision (with LRAD to/from restroom) Pt Will Perform Toileting - Clothing Manipulation and hygiene: with min assist;sitting/lateral leans  OT Frequency: Min 2X/week   Barriers to D/C:            Co-evaluation              AM-PAC OT "6 Clicks" Daily Activity     Outcome Measure Help from another person eating meals?: None Help from another person taking care of personal grooming?: A Little Help from another person toileting, which includes using toliet, bedpan, or urinal?: A Lot Help from another person bathing (including washing, rinsing, drying)?: A Lot Help from another person to put on and taking off regular upper body clothing?: A Little Help from another person to put on and taking off regular lower body clothing?: A Lot 6 Click Score: 16   End of Session Nurse Communication: Mobility status  Activity Tolerance: Patient tolerated treatment well Patient left: in bed;with call bell/phone within reach;Other (comment) (seated EOB with meal SEUTP)  OT Visit Diagnosis: Unsteadiness on feet (R26.81);Muscle weakness (generalized) (M62.81)                Time: 3833-3832 OT Time Calculation (min): 13 min Charges:  OT General Charges $OT Visit: 1 Visit OT Evaluation $OT Eval Moderate Complexity: Green, Zimmerman, OTR/L ascom [phone] 03/11/21, 6:24 PM

## 2021-03-12 DIAGNOSIS — J9601 Acute respiratory failure with hypoxia: Secondary | ICD-10-CM | POA: Diagnosis not present

## 2021-03-12 DIAGNOSIS — Z6841 Body Mass Index (BMI) 40.0 and over, adult: Secondary | ICD-10-CM

## 2021-03-12 DIAGNOSIS — J9602 Acute respiratory failure with hypercapnia: Secondary | ICD-10-CM

## 2021-03-12 DIAGNOSIS — U071 COVID-19: Secondary | ICD-10-CM | POA: Diagnosis not present

## 2021-03-12 DIAGNOSIS — I5033 Acute on chronic diastolic (congestive) heart failure: Secondary | ICD-10-CM | POA: Diagnosis not present

## 2021-03-12 DIAGNOSIS — G4733 Obstructive sleep apnea (adult) (pediatric): Secondary | ICD-10-CM

## 2021-03-12 DIAGNOSIS — J449 Chronic obstructive pulmonary disease, unspecified: Secondary | ICD-10-CM

## 2021-03-12 DIAGNOSIS — J80 Acute respiratory distress syndrome: Secondary | ICD-10-CM

## 2021-03-12 DIAGNOSIS — R531 Weakness: Secondary | ICD-10-CM

## 2021-03-12 LAB — CBC WITH DIFFERENTIAL/PLATELET
Abs Immature Granulocytes: 0.04 10*3/uL (ref 0.00–0.07)
Basophils Absolute: 0 10*3/uL (ref 0.0–0.1)
Basophils Relative: 1 %
Eosinophils Absolute: 0.2 10*3/uL (ref 0.0–0.5)
Eosinophils Relative: 2 %
HCT: 42.8 % (ref 39.0–52.0)
Hemoglobin: 11.9 g/dL — ABNORMAL LOW (ref 13.0–17.0)
Immature Granulocytes: 1 %
Lymphocytes Relative: 17 %
Lymphs Abs: 1.3 10*3/uL (ref 0.7–4.0)
MCH: 23.1 pg — ABNORMAL LOW (ref 26.0–34.0)
MCHC: 27.8 g/dL — ABNORMAL LOW (ref 30.0–36.0)
MCV: 82.9 fL (ref 80.0–100.0)
Monocytes Absolute: 0.6 10*3/uL (ref 0.1–1.0)
Monocytes Relative: 8 %
Neutro Abs: 5.5 10*3/uL (ref 1.7–7.7)
Neutrophils Relative %: 71 %
Platelets: 247 10*3/uL (ref 150–400)
RBC: 5.16 MIL/uL (ref 4.22–5.81)
RDW: 17.6 % — ABNORMAL HIGH (ref 11.5–15.5)
WBC: 7.7 10*3/uL (ref 4.0–10.5)
nRBC: 0.7 % — ABNORMAL HIGH (ref 0.0–0.2)

## 2021-03-12 LAB — BASIC METABOLIC PANEL
Anion gap: 6 (ref 5–15)
BUN: 32 mg/dL — ABNORMAL HIGH (ref 8–23)
CO2: 40 mmol/L — ABNORMAL HIGH (ref 22–32)
Calcium: 8.6 mg/dL — ABNORMAL LOW (ref 8.9–10.3)
Chloride: 92 mmol/L — ABNORMAL LOW (ref 98–111)
Creatinine, Ser: 1 mg/dL (ref 0.61–1.24)
GFR, Estimated: >60 mL/min (ref >60)
Glucose, Bld: 95 mg/dL (ref 70–99)
Potassium: 3.5 mmol/L (ref 3.5–5.1)
Sodium: 138 mmol/L (ref 135–145)

## 2021-03-12 LAB — MAGNESIUM: Magnesium: 2 mg/dL (ref 1.7–2.4)

## 2021-03-12 NOTE — Progress Notes (Signed)
Patient educated to not place bed in high position.

## 2021-03-12 NOTE — Progress Notes (Signed)
Physical Therapy Treatment Patient Details Name: Gary [name] MRN: [id number] DOB: 1955-07-23 Today's Date: 03/12/2021   History of Present Illness Gary [name] is a 65 y.o. male with medical history significant for COPD on home O2, morbid obesity, HFpEF, grade 3 DD, history of GI bleed OSA on nighttime BiPAP, HTN, transferred back from [place] on 03/11/21 for continued medical management ongoing medical problems patient was transferred to [facility] on 11/8 for ARDS secondary to severe COVID-19.  He was successfully extubated on 11/13 and has been gradually improving.    PT Comments    Patient received in bed, agrees to PT session. He requires heavy use of bed rails and controls to get from supine to sitting. Able to stand from very elevated surface with min guard x 4 reps. Seated rest between each rep. Performed marching in place and was able to take a few side steps along edge of bed this session. He performed seated and supine LE exercises. He will continue to benefit from skilled PT while here to improve LE strength and endurance for improved independence with mobility.     Recommendations for follow up therapy are one component of a multi-disciplinary discharge planning process, led by the attending physician.  Recommendations may be updated based on patient status, additional functional criteria and insurance authorization.  Follow Up Recommendations  Skilled nursing-short term rehab (<3 hours/day)     Assistance Recommended at Discharge Intermittent Supervision/Assistance  Equipment Recommendations  Other (comment) (TBD)    Recommendations for Other Services       Precautions / Restrictions Precautions Precautions: Fall Restrictions Weight Bearing Restrictions: No     Mobility  Bed Mobility Overal bed mobility: Needs Assistance Bed Mobility: Supine to Sit;Sit to Supine     Supine to sit: HOB elevated;Supervision Sit to supine: Min assist;HOB elevated   General bed  mobility comments: requires assistance to bring legs back up onto bed    Transfers Overall transfer level: Needs assistance Equipment used: Rolling walker (2 wheels) Transfers: Sit to/from Stand Sit to Stand: From elevated surface;Min guard           General transfer comment: likes to have the bed VERY high to stand.    Ambulation/Gait Ambulation/Gait assistance: Min assist Gait Distance (Feet): 3 Feet Assistive device: Rolling walker (2 wheels) Gait Pattern/deviations: Step-to pattern Gait velocity: decr     General Gait Details: able to march in place and side step along edge of bed   Stairs             Wheelchair Mobility    Modified Rankin (Stroke Patients Only)       Balance Overall balance assessment: Needs assistance Sitting-balance support: Feet supported Sitting balance-[name] Scale: Good Sitting balance - Comments: steady sitting at edge of bed   Standing balance support: During functional activity;Bilateral upper extremity supported;Reliant on assistive device for balance Standing balance-[name] Scale: Fair                              Cognition Arousal/Alertness: Awake/alert Behavior During Therapy: WFL for tasks assessed/performed Overall Cognitive Status: Within Functional Limits for tasks assessed                                          Exercises Other Exercises Other Exercises: Seated LAQ, marching x 10 reps each. Supine: AP,  SLR x 5    General Comments        Pertinent Vitals/Pain Pain Assessment: No/denies pain    Home Living                          Prior Function            PT Goals (current goals can now be found in the care plan section) Acute Rehab PT Goals Patient Stated Goal: to go home PT Goal Formulation: With patient Time For Goal Achievement: 03/25/21 Potential to Achieve Goals: Fair Progress towards PT goals: Progressing toward goals    Frequency    Min  2X/week      PT Plan Current plan remains appropriate    Co-evaluation              AM-PAC PT "6 Clicks" Mobility   Outcome Measure  Help needed turning from your back to your side while in a flat bed without using bedrails?: A Lot Help needed moving from lying on your back to sitting on the side of a flat bed without using bedrails?: A Lot Help needed moving to and from a bed to a chair (including a wheelchair)?: Total Help needed standing up from a chair using your arms (e.g., wheelchair or bedside chair)?: A Lot Help needed to walk in hospital room?: A Lot Help needed climbing 3-5 steps with a railing? : Total 6 Click Score: 10    End of Session Equipment Utilized During Treatment: Gait belt;Oxygen Activity Tolerance: Patient limited by fatigue;Patient tolerated treatment well Patient left: in bed Nurse Communication: Mobility status PT Visit Diagnosis: Other abnormalities of gait and mobility (R26.89);Muscle weakness (generalized) (M62.81);Difficulty in walking, not elsewhere classified (R26.2)     Time: 8032-1224 PT Time Calculation (min) (ACUTE ONLY): 30 min  Charges:  $Therapeutic Exercise: 8-22 mins $Therapeutic Activity: 8-22 mins                     Gary [name], PT, GCS 03/12/21,2:36 PM

## 2021-03-12 NOTE — NC FL2 (Signed)
[place] LEVEL OF CARE SCREENING TOOL     IDENTIFICATION  Patient Name: Gary [name] Birthdate: November 03, 1955 Sex: male Admission Date (Current Location): 03/10/2021  [facility] and [place] Number:  Engineering geologist and Address:  [facility], [address], [place], [place] [address]      Provider Number: [id number]  Attending Physician Name and Address:  Loletha Grayer, MD  Relative Name and Phone Number:  [name] (Sister)   [phone] [facility])    Current Level of Care: Hospital Recommended Level of Care: [place] Prior Approval Number:    Date Approved/Denied:   PASRR Number: [id number]  Discharge Plan:      Current Diagnoses: Patient Active Problem List   Diagnosis Date Noted   Pneumonia 03/11/2021   Acute respiratory failure with hypoxia and hypercapnia ([place]) 03/11/2021   (HFpEF) heart failure with preserved ejection fraction ([name])    Chronic respiratory failure with hypoxia ([place])    Hyperlipidemia 02/19/2021   COVID-19 virus infection    Acute on chronic respiratory failure ([place]) 02/18/2021   Spondylosis without myelopathy or radiculopathy, lumbosacral region 11/15/2020   Lumbosacral facet joint syndrome (Bilateral) 10/31/2020   Generalized osteoarthritis of multiple sites 10/31/2020   AKI (acute kidney injury) ([place]) 03/04/2020   Hyperkalemia 03/04/2020   COPD (chronic obstructive pulmonary disease) ([place]) 03/02/2020   Marijuana use 01/17/2020   Elevated BUN 01/17/2020   Hypocalcemia 01/17/2020   Hypochloremia 01/17/2020   Low thyroid stimulating hormone (TSH) level 01/17/2020   Vitamin D deficiency 01/17/2020   Elevated sed rate 01/17/2020   Thrombocytopenia ([place]) 01/17/2020   Elevated hematocrit 01/17/2020   Elevated brain natriuretic peptide (BNP) level 01/17/2020   Positive hepatitis C antibody test 01/17/2020   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 mass index (BMI) of  50.0 to 59.9 in adult ([place]) 01/17/2020   Osteoarthritis of glenohumeral joints (Bilateral) 01/17/2020   Osteoarthritis of AC (acromioclavicular) joints (Bilateral) 01/17/2020   Lumbar Grade 1 Anterolisthesis of L4 over L5 (3 mm) (stable) 01/17/2020   Osteoarthritis of lumbar spine (L3 through S1) 01/17/2020   Lumbar facet osteoarthritis (Multilevel) (Bilateral) 01/17/2020   Osteoarthritis of hips (Bilateral) 01/17/2020   Osteoarthritis of knees (Bilateral) 01/17/2020   Tricompartment osteoarthritis of knees (Bilateral) 01/17/2020   Patellofemoral arthralgia of knees (Bilateral) 01/17/2020   Plantar and Achilles calcaneal spurs (Bilateral) 01/17/2020   Ankle edema (Bilateral) 01/17/2020   Traumatic osteoarthritis of hands (Bilateral) 01/17/2020   Polysubstance abuse ([place]) 08/25/2019   Chronic pain syndrome 07/14/2019   Pharmacologic therapy 07/14/2019   Disorder of skeletal system 07/14/2019   Problems influencing health status 07/14/2019   Chronic low back pain (1ry area of Pain) (Bilateral) (L>R) w/o sciatica 07/14/2019   Chronic shoulder pain (2ry area of Pain) (Bilateral) (L>R) 07/14/2019   Chronic feet pain (3ry area of Pain) (Bilateral) 07/14/2019   Chronic knee pain (4th area of Pain) (Bilateral) (L>R) 07/14/2019   Chronic hip pain (5th area of Pain) (Bilateral) (L>R) 07/14/2019   Chronic hand pain (6th area of Pain) (Bilateral) (L>R) 07/14/2019   Lumbar facet arthropathy 07/14/2019   DDD (degenerative disc disease), lumbosacral 07/14/2019   Closed fracture of shaft of humerus, sequela (Left) 07/14/2019   DDD (degenerative disc disease), cervical 07/14/2019   Tobacco use 05/04/2017   Lymphedema 05/04/2017   CHF (congestive heart failure) ([place]) 04/12/2017   OSA (obstructive sleep apnea) 02/28/2015   Edema of both feet    Closed fracture of scapula, sequela (Left)  04/01/2014   Morbid obesity with BMI of 50.0-59.9, adult ([place]) 04/01/2014   HTN (hypertension) 04/01/2014    Multiple rib fractures 03/27/2014    Orientation RESPIRATION BLADDER Height & Weight     Self, Time, Situation, Place  O2 External catheter Weight: (!) 412 lb 11.2 oz (187.2 kg) Height:  6\' 3"  (190.5 cm)  BEHAVIORAL SYMPTOMS/MOOD NEUROLOGICAL BOWEL NUTRITION STATUS      Incontinent Diet (heart ; thin liquids)  AMBULATORY STATUS COMMUNICATION OF NEEDS Skin   Extensive Assist Verbally Other (Comment) (dry skin)                       Personal Care Assistance Level of Assistance  Bathing, Feeding, Dressing Bathing Assistance: Maximum assistance Feeding assistance: Independent Dressing Assistance: Maximum assistance     Functional Limitations Info             SPECIAL CARE FACTORS FREQUENCY  PT (By licensed PT), OT (By licensed OT)     PT Frequency: 5 times per week OT Frequency: 5 times per week            Contractures      Additional Factors Info  Code Status, Allergies Code Status Info: full Allergies Info: nka           Current Medications (03/12/2021):  This is the current hospital active medication list Current Facility-Administered Medications  Medication Dose Route Frequency Provider Last Rate Last Admin   acetaminophen (TYLENOL) tablet 650 mg  650 mg Oral Q6H PRN Athena Masse, MD       Or   acetaminophen (TYLENOL) suppository 650 mg  650 mg Rectal Q6H PRN Athena Masse, MD       budesonide (PULMICORT) nebulizer solution 0.25 mg  0.25 mg Nebulization BID Berle Mull M, MD   0.25 mg at 03/12/21 0729   enoxaparin (LOVENOX) injection 92.5 mg  0.5 mg/kg Subcutaneous Q24H Judd Gaudier V, MD   92.5 mg at 03/12/21 0945   ferrous sulfate tablet 325 mg  325 mg Oral BID WC Lavina Hamman, MD   325 mg at 03/12/21 0946   haloperidol lactate (HALDOL) injection 2 mg  2 mg Intravenous Q6H PRN Lavina Hamman, MD       ipratropium-albuterol (DUONEB) 0.5-2.5 (3) MG/3ML nebulizer solution 3 mL  3 mL Nebulization Q6H PRN Lavina Hamman, MD       nystatin  (MYCOSTATIN) 100000 UNIT/ML suspension 500,000 Units  5 mL Oral QID Lavina Hamman, MD   500,000 Units at 03/11/21 2142   ondansetron (ZOFRAN) tablet 4 mg  4 mg Oral Q6H PRN Athena Masse, MD       Or   ondansetron [facility]) injection 4 mg  4 mg Intravenous Q6H PRN Athena Masse, MD       pantoprazole (PROTONIX) EC tablet 40 mg  40 mg Oral Daily Lavina Hamman, MD   40 mg at 03/12/21 0946   simvastatin (ZOCOR) tablet 10 mg  10 mg Oral q1800 Lavina Hamman, MD   10 mg at 03/11/21 1722   torsemide (DEMADEX) tablet 40 mg  40 mg Oral Daily Lavina Hamman, MD   40 mg at 03/12/21 0946   umeclidinium-vilanterol (ANORO ELLIPTA) 62.5-25 MCG/ACT 1 puff  1 puff Inhalation Daily Lavina Hamman, MD   1 puff at 03/12/21 2595     Discharge Medications: Please see discharge summary for a list of discharge medications.  Relevant Imaging Results:  Relevant Lab Results:   Additional Information SS #: [id number]  [name], LCSW

## 2021-03-12 NOTE — Progress Notes (Addendum)
Patient ID: Gary [name], male   DOB: 12-10-1955, 65 y.o.   MRN: [id number] [facility] PROGRESS NOTE  Gary [name] [id number]:[id number] DOB: 03/02/1956 DOA: 03/10/2021 PCP: [facility]  HPI/Subjective: Patient feels a little bit better today.  Initially admitted 02/18/2021 COVID-19 pneumonia and acute hypoxic respiratory failure.  Patient was transferred over to [facility] and then transfer back to [facility].  Objective: Vitals:   03/12/21 0754 03/12/21 1132  BP: 97/66 107/77  Pulse: 66 87  Resp: 20 20  Temp: 98.1 F (36.7 C) 98.4 F (36.9 C)  SpO2: 94% 94%    Intake/Output Summary (Last 24 hours) at 03/12/2021 1443 Last data filed at 03/12/2021 1420 Gross per 24 hour  Intake 1160 ml  Output 800 ml  Net 360 ml   Filed Weights   03/11/21 0037  Weight: (!) 187.2 kg    ROS: Review of Systems  Respiratory:  Positive for cough and shortness of breath.   Cardiovascular:  Negative for chest pain.  Gastrointestinal:  Negative for abdominal pain, nausea and vomiting.  Exam: Physical Exam HENT:     Head: Normocephalic.     Mouth/Throat:     Pharynx: No oropharyngeal exudate.  Eyes:     General: Lids are normal.     Conjunctiva/sclera: Conjunctivae normal.  Cardiovascular:     Rate and Rhythm: Normal rate and regular rhythm.     Heart sounds: Normal heart sounds, S1 normal and S2 normal.  Pulmonary:     Breath sounds: Examination of the right-lower field reveals decreased breath sounds. Examination of the left-lower field reveals decreased breath sounds. Decreased breath sounds present. No wheezing, rhonchi or rales.  Abdominal:     Palpations: Abdomen is soft.     Tenderness: There is no abdominal tenderness.  Musculoskeletal:     Right lower leg: Swelling present.     Left lower leg: Swelling present.  Skin:    General: Skin is warm.     Findings: No rash.     Comments: Small ulcer on nose  Neurological:     Mental Status: He is alert and oriented to  person, place, and time.      Scheduled Meds:  budesonide  0.25 mg Nebulization BID   enoxaparin (LOVENOX) injection  0.5 mg/kg Subcutaneous Q24H   ferrous sulfate  325 mg Oral BID WC   nystatin  5 mL Oral QID   pantoprazole  40 mg Oral Daily   simvastatin  10 mg Oral q1800   torsemide  40 mg Oral Daily   umeclidinium-vilanterol  1 puff Inhalation Daily     Assessment/Plan:  Acute respiratory failure with hypoxia and hypercapnia.  PCO2 was up at 105 at one point.  Patient is a candidate for noninvasive ventilation at night.  Currently on BiPAP at night.  Patient was on 4 L of oxygen this morning.  As per nursing staff desaturates very easily. ARDS secondary to severe COVID-19 infection.  Out of isolation now since diagnosis was on 02/19/2020.  Continue torsemide. Acute on chronic diastolic congestive heart failure.  Continue torsemide 40 mg daily.  Blood pressure too low for other medications currently. Morbid obesity with a BMI of 51.58. Obstructive sleep apnea on BiPAP at night COPD.  Continue inhalers Weakness.  Physical therapy recommending rehab     Code Status:     Code Status Orders  (From admission, onward)           Start     Ordered  03/11/21 0156  Full code  Continuous        03/11/21 0156           Code Status History     Date Active Date Inactive Code Status Order ID Comments User Context   02/18/2021 2114 02/23/2021 0759 Full Code [id number]  Rust-Chester, Huel Cote, NP ED   02/25/2020 1310 03/05/2020 2206 Full Code [id number]  [name], MD ED   08/04/2019 1910 08/10/2019 2239 Full Code [id number]  Mansy, Arvella Merles, MD ED   05/26/2019 1806 06/01/2019 1711 Full Code [id number]  Loletha Grayer, MD ED   10/14/2018 0330 10/22/2018 1951 Full Code [id number]  Lance Coon, MD Inpatient   06/25/2018 0026 06/28/2018 1842 Full Code [id number]  Lance Coon, MD ED   05/29/2018 0001 05/31/2018 1732 Full Code [id number]  Vaughan Basta, MD Inpatient   04/12/2017  1353 04/15/2017 1720 Full Code [id number]  Fritzi Mandes, MD Inpatient   01/10/2015 1953 01/14/2015 1703 Full Code [id number]  Aldean Jewett, MD Inpatient   03/27/2014 1847 04/02/2014 1512 Full Code [id number]  Doreen Salvage, MD ED      Family Communication: Spoke with patient's wife on the phone Disposition Plan: Status is: Inpatient  Time spent: 27 minutes  [place]

## 2021-03-12 NOTE — TOC Initial Note (Signed)
Transition of Care [facility]) - Initial/Assessment Note    Patient Details  Name: Gary [name] MRN: [id number] Date of Birth: 03/10/1956  Transition of Care [facility]) CM/SW Contact:    Magnus Ivan, LCSW Phone Number: 03/12/2021, 12:00 PM  Clinical Narrative:              Spoke with patient regarding DC planning.  Patient lives with his wife and usually drives himself to appointments. PCP and Pharmacy is [facility].  Patient went to a SNF in [place] in the past.  Patient has a RW, cane, and home o2 through [employer].  Patient said he has had 2 COVID vaccines and 2 boosters.  Patient is agreeable to SNF rec, CSW explained with Medicaid they would take his check and he would have to stay 30 days. He stated he would talk to his family about helping him cover his bills so he can go to rehab for 30 days.  Patient prefers [facility] in [place]. Understands it is based on bed offers.  CSW will start work up.     Expected Discharge Plan: Skilled Nursing Facility Barriers to Discharge: Continued Medical Work up   Patient Goals and CMS Choice Patient states their goals for this hospitalization and ongoing recovery are:: SNF CMS Medicare.gov Compare Post Acute Care list provided to:: Patient Choice offered to / list presented to : Patient  Expected Discharge Plan and Services Expected Discharge Plan: [place]       Living arrangements for the past 2 months: Single Family Home                                      Prior Living Arrangements/Services Living arrangements for the past 2 months: Single Family Home Lives with:: Spouse Patient language and need for interpreter reviewed:: Yes Do you feel safe going back to the place where you live?: Yes      Need for Family Participation in Patient Care: Yes (Comment) Care giver support system in place?: Yes (comment) Current home services: DME Criminal Activity/Legal Involvement Pertinent to Current  Situation/Hospitalization: No - Comment as needed  Activities of Daily Living Home Assistive Devices/Equipment: CPAP, Shower chair with back ADL Screening (condition at time of admission) Patient's cognitive ability adequate to safely complete daily activities?: Yes Is the patient deaf or have difficulty hearing?: No Does the patient have difficulty seeing, even when wearing glasses/contacts?: No Does the patient have difficulty concentrating, remembering, or making decisions?: No Patient able to express need for assistance with ADLs?: Yes Does the patient have difficulty dressing or bathing?: Yes Independently performs ADLs?: No Communication: Independent Dressing (OT): Needs assistance Is this a change from baseline?: Pre-admission baseline Grooming: Needs assistance Is this a change from baseline?: Pre-admission baseline Feeding: Independent Bathing: Needs assistance Is this a change from baseline?: Pre-admission baseline Toileting: Needs assistance Is this a change from baseline?: Change from baseline, expected to last >3days In/Out Bed: Needs assistance Is this a change from baseline?: Change from baseline, expected to last >3 days Walks in Home: Independent with device (comment) Does the patient have difficulty walking or climbing stairs?: Yes Weakness of Legs: Both Weakness of Arms/Hands: Both  Permission Sought/Granted Permission sought to share information with : Facility Sport and exercise psychologist, Family Supports Permission granted to share information with : Yes, Verbal Permission Granted     Permission granted to share info w AGENCY: SNF  Emotional Assessment       Orientation: : Oriented to Self, Oriented to Place, Oriented to  Time, Oriented to Situation Alcohol / Substance Use: Not Applicable Psych Involvement: No (comment)  Admission diagnosis:  Pneumonia [J18.9] Acute respiratory failure with hypoxia and hypercapnia ([name]) [J96.01, J96.02] Patient  Active Problem List   Diagnosis Date Noted   Pneumonia 03/11/2021   Acute respiratory failure with hypoxia and hypercapnia ([name]) 03/11/2021   (HFpEF) heart failure with preserved ejection fraction ([name])    Chronic respiratory failure with hypoxia ([name])    Hyperlipidemia 02/19/2021   COVID-19 virus infection    Acute on chronic respiratory failure ([place]) 02/18/2021   Spondylosis without myelopathy or radiculopathy, lumbosacral region 11/15/2020   Lumbosacral facet joint syndrome (Bilateral) 10/31/2020   Generalized osteoarthritis of multiple sites 10/31/2020   AKI (acute kidney injury) ([employer]) 03/04/2020   Hyperkalemia 03/04/2020   COPD (chronic obstructive pulmonary disease) ([name]) 03/02/2020   Marijuana use 01/17/2020   Elevated BUN 01/17/2020   Hypocalcemia 01/17/2020   Hypochloremia 01/17/2020   Low thyroid stimulating hormone (TSH) level 01/17/2020   Vitamin D deficiency 01/17/2020   Elevated sed rate 01/17/2020   Thrombocytopenia ([name]) 01/17/2020   Elevated hematocrit 01/17/2020   Elevated brain natriuretic peptide (BNP) level 01/17/2020   Positive hepatitis C antibody test 01/17/2020   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 mass index (BMI) of 50.0 to 59.9 in adult ([place]) 01/17/2020   Osteoarthritis of glenohumeral joints (Bilateral) 01/17/2020   Osteoarthritis of AC (acromioclavicular) joints (Bilateral) 01/17/2020   Lumbar Grade 1 Anterolisthesis of L4 over L5 (3 mm) (stable) 01/17/2020   Osteoarthritis of lumbar spine (L3 through S1) 01/17/2020   Lumbar facet osteoarthritis (Multilevel) (Bilateral) 01/17/2020   Osteoarthritis of hips (Bilateral) 01/17/2020   Osteoarthritis of knees (Bilateral) 01/17/2020   Tricompartment osteoarthritis of knees (Bilateral) 01/17/2020   Patellofemoral arthralgia of knees (Bilateral) 01/17/2020   Plantar and Achilles calcaneal spurs (Bilateral) 01/17/2020   Ankle edema (Bilateral) 01/17/2020   Traumatic osteoarthritis of hands  (Bilateral) 01/17/2020   Polysubstance abuse ([place]) 08/25/2019   Chronic pain syndrome 07/14/2019   Pharmacologic therapy 07/14/2019   Disorder of skeletal system 07/14/2019   Problems influencing health status 07/14/2019   Chronic low back pain (1ry area of Pain) (Bilateral) (L>R) w/o sciatica 07/14/2019   Chronic shoulder pain (2ry area of Pain) (Bilateral) (L>R) 07/14/2019   Chronic feet pain (3ry area of Pain) (Bilateral) 07/14/2019   Chronic knee pain (4th area of Pain) (Bilateral) (L>R) 07/14/2019   Chronic hip pain (5th area of Pain) (Bilateral) (L>R) 07/14/2019   Chronic hand pain (6th area of Pain) (Bilateral) (L>R) 07/14/2019   Lumbar facet arthropathy 07/14/2019   DDD (degenerative disc disease), lumbosacral 07/14/2019   Closed fracture of shaft of humerus, sequela (Left) 07/14/2019   DDD (degenerative disc disease), cervical 07/14/2019   Tobacco use 05/04/2017   Lymphedema 05/04/2017   CHF (congestive heart failure) ([place]) 04/12/2017   OSA (obstructive sleep apnea) 02/28/2015   Edema of both feet    Closed fracture of scapula, sequela (Left) 04/01/2014   Morbid obesity with BMI of 50.0-59.9, adult ([place]) 04/01/2014   HTN (hypertension) 04/01/2014   Multiple rib fractures 03/27/2014   PCP:  [facility] [place]:   [place] [address] [place] [address] Phone: [phone] Fax: [phone]     Social Determinants of Health (SDOH) Interventions    Readmission Risk Interventions Readmission Risk  Prevention Plan 03/12/2021 03/01/2020 03/01/2020  Transportation Screening Complete Complete Complete  PCP or Specialist Appt within 3-5 Days Complete Not Complete Not Complete  Not Complete comments - To be scheduled by unit secretary will be scheduled by unit clerk  [place] or [place] Complete Complete Complete  Social Work Consult for [place] Planning/Counseling Complete Complete  Complete  Palliative Care Screening Not Applicable Not Applicable Not Applicable  Medication Review Press photographer) Complete Complete Complete  Some recent data might be hidden

## 2021-03-13 DIAGNOSIS — I5033 Acute on chronic diastolic (congestive) heart failure: Secondary | ICD-10-CM | POA: Diagnosis not present

## 2021-03-13 DIAGNOSIS — U071 COVID-19: Secondary | ICD-10-CM | POA: Diagnosis not present

## 2021-03-13 DIAGNOSIS — J9601 Acute respiratory failure with hypoxia: Secondary | ICD-10-CM | POA: Diagnosis not present

## 2021-03-13 LAB — BASIC METABOLIC PANEL
Anion gap: 7 (ref 5–15)
BUN: 32 mg/dL — ABNORMAL HIGH (ref 8–23)
CO2: 38 mmol/L — ABNORMAL HIGH (ref 22–32)
Calcium: 8.4 mg/dL — ABNORMAL LOW (ref 8.9–10.3)
Chloride: 96 mmol/L — ABNORMAL LOW (ref 98–111)
Creatinine, Ser: 1.03 mg/dL (ref 0.61–1.24)
GFR, Estimated: >60 mL/min (ref >60)
Glucose, Bld: 114 mg/dL — ABNORMAL HIGH (ref 70–99)
Potassium: 3.7 mmol/L (ref 3.5–5.1)
Sodium: 141 mmol/L (ref 135–145)

## 2021-03-13 LAB — CBC WITH DIFFERENTIAL/PLATELET
Abs Immature Granulocytes: 0.04 10*3/uL (ref 0.00–0.07)
Basophils Absolute: 0 10*3/uL (ref 0.0–0.1)
Basophils Relative: 1 %
Eosinophils Absolute: 0.2 10*3/uL (ref 0.0–0.5)
Eosinophils Relative: 2 %
HCT: 45.3 % (ref 39.0–52.0)
Hemoglobin: 12.1 g/dL — ABNORMAL LOW (ref 13.0–17.0)
Immature Granulocytes: 1 %
Lymphocytes Relative: 18 %
Lymphs Abs: 1.3 10*3/uL (ref 0.7–4.0)
MCH: 23 pg — ABNORMAL LOW (ref 26.0–34.0)
MCHC: 26.7 g/dL — ABNORMAL LOW (ref 30.0–36.0)
MCV: 86.3 fL (ref 80.0–100.0)
Monocytes Absolute: 0.7 10*3/uL (ref 0.1–1.0)
Monocytes Relative: 10 %
Neutro Abs: 5.1 10*3/uL (ref 1.7–7.7)
Neutrophils Relative %: 68 %
Platelets: 210 10*3/uL (ref 150–400)
RBC: 5.25 MIL/uL (ref 4.22–5.81)
RDW: 17.5 % — ABNORMAL HIGH (ref 11.5–15.5)
WBC: 7.3 10*3/uL (ref 4.0–10.5)
nRBC: 1.1 % — ABNORMAL HIGH (ref 0.0–0.2)

## 2021-03-13 LAB — MAGNESIUM: Magnesium: 2.2 mg/dL (ref 1.7–2.4)

## 2021-03-13 MED ORDER — SALINE SPRAY 0.65 % NA SOLN
1.0000 | NASAL | Status: DC | PRN
  Administered 2021-03-13: 02:00:00 1 via NASAL
  Filled 2021-03-13: qty 44

## 2021-03-13 NOTE — Consult Note (Addendum)
   Heart Failure Nurse Navigator Note  HFpEF EF 60 to 65%.  Grade 3 diastolic dysfunction.  Normal right ventricular systolic function.  Patient originally presented from home and due to severe COVID infection/ARDs/pneumonia he was transferred to [facility].  On March 10, 2021 he was transferred back to [facility] from [facility] for continued management of his ongoing medical problems.  Comorbidities:  Morbid obesity COPD on home O2 GI bleed Obstructive sleep apnea on nighttime BiPAP Hypertension  Medications:  Simvastatin 10 mg daily Torsemide 40 mg daily  Labs:  Sodium 141, potassium 3.7, chloride 96, BUN 32, creatinine 1.03, magnesium 2.2 Weight 187.2 kg Pressure 108/75   Initial meeting with patient today, he is currently awake sitting up in the chair at bedside in no acute distress.  He states that he lives at home with his girlfriend.  They both share with the responsibility of fixing meals.  He states that they try to eat lean meats and fresh or frozen fruits and vegetables.  He does not add salt at the table.  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  States prior to admission he did not weigh himself on a daily basis, explained the importance of weight daily and what to report to physician.  He asked about rescue doses with weight gains, explained that he needed to discuss that with his physician but was a very good question.  Discussed the outpatient heart failure clinic, he states that he is allowed there in the past.  He has upcoming appointment is December 13 at 1 PM.  He has a 24% no-show ratio at 23 out of 97 appointments.  Pricilla Riffle RN CHFN

## 2021-03-13 NOTE — Progress Notes (Signed)
Patient ID: Gary [name], male   DOB: December 11, 1955, 65 y.o.   MRN: [id number] [facility] PROGRESS NOTE  Gary [name] YQM:[id number] DOB: Dec 14, 1955 DOA: 03/10/2021 PCP: [facility] [place]  HPI/Subjective: Patient needed help to get out of the bed and shuffle over to the chair.  Still feeling weak but happy to be up and moving around.  Feels like his breathing is better.  He held his sats while on 5 L with moving around.  I dialed him down to 4 L.  Urinating well.  Initially admitted on 02/18/2021 with acute respiratory failure secondary to COVID-19.  Objective: Vitals:   03/13/21 0810 03/13/21 1143  BP: 117/79 108/75  Pulse: 89 85  Resp: 18 18  Temp: 97.9 F (36.6 C) 97.8 F (36.6 C)  SpO2: 94% 92%    Intake/Output Summary (Last 24 hours) at 03/13/2021 1209 Last data filed at 03/13/2021 0914 Gross per 24 hour  Intake 480 ml  Output 1000 ml  Net -520 ml   Filed Weights   03/11/21 0037  Weight: (!) 187.2 kg    ROS: Review of Systems  Constitutional:  Positive for malaise/fatigue.  Respiratory:  Negative for shortness of breath.   Cardiovascular:  Negative for chest pain.  Gastrointestinal:  Negative for abdominal pain, nausea and vomiting.  Exam: Physical Exam HENT:     Head: Normocephalic.     Mouth/Throat:     Pharynx: No oropharyngeal exudate.  Eyes:     General: Lids are normal.     Conjunctiva/sclera: Conjunctivae normal.  Cardiovascular:     Rate and Rhythm: Normal rate and regular rhythm.     Heart sounds: Normal heart sounds, S1 normal and S2 normal.  Pulmonary:     Breath sounds: Examination of the right-lower field reveals decreased breath sounds. Examination of the left-lower field reveals decreased breath sounds. Decreased breath sounds present. No wheezing, rhonchi or rales.  Abdominal:     Palpations: Abdomen is soft.     Tenderness: There is no abdominal tenderness.  Musculoskeletal:     Right ankle: Swelling present.     Left  ankle: Swelling present.  Skin:    General: Skin is warm.     Findings: No rash.  Neurological:     Mental Status: He is alert and oriented to person, place, and time.      Scheduled Meds:  budesonide  0.25 mg Nebulization BID   enoxaparin (LOVENOX) injection  0.5 mg/kg Subcutaneous Q24H   ferrous sulfate  325 mg Oral BID WC   nystatin  5 mL Oral QID   pantoprazole  40 mg Oral Daily   simvastatin  10 mg Oral q1800   torsemide  40 mg Oral Daily   umeclidinium-vilanterol  1 puff Inhalation Daily     Assessment/Plan:  Acute hypoxic, hypercapnic respiratory failure.  PCO2 was up at 105 at 1 point during the hospital course.  Patient is a candidate for noninvasive ventilation at night.  Currently on BiPAP at night and was on 5 L of oxygen this morning.  I dialed down to 4 L of oxygen.  Continue to titrate down as much as possible. ARDS secondary to severe COVID-19 infection.  Patient is out of isolation now since 21 days from diagnosis.  Continue torsemide. Acute on chronic diastolic congestive heart failure.  Continue torsemide 40 mg daily.  Blood pressure too low for other medications currently Morbid obesity with a BMI of 51.58 Obstructive sleep apnea on BiPAP at night  COPD.  Continue inhalers Weakness.  Physical therapy recommending rehab.  Transitional care team looking into options and patient will need a BiPAP at facility.      Code Status:     Code Status Orders  (From admission, onward)           Start     Ordered   03/11/21 0156  Full code  Continuous        03/11/21 0156           Code Status History     Date Active Date Inactive Code Status Order ID Comments User Context   02/18/2021 2114 02/23/2021 0759 Full Code [id number]  Rust-Chester, Huel Cote, NP ED   02/25/2020 1310 03/05/2020 2206 Full Code [id number]  [name], MD ED   08/04/2019 1910 08/10/2019 2239 Full Code [id number]  Mansy, Arvella Merles, MD ED   05/26/2019 1806 06/01/2019 1711 Full Code  [id number]  Loletha Grayer, MD ED   10/14/2018 0330 10/22/2018 1951 Full Code [id number]  Lance Coon, MD Inpatient   06/25/2018 0026 06/28/2018 1842 Full Code [id number]  Lance Coon, MD ED   05/29/2018 0001 05/31/2018 1732 Full Code [id number]  Vaughan Basta, MD Inpatient   04/12/2017 1353 04/15/2017 1720 Full Code [id number]  Fritzi Mandes, MD Inpatient   01/10/2015 1953 01/14/2015 1703 Full Code [id number]  Aldean Jewett, MD Inpatient   03/27/2014 1847 04/02/2014 1512 Full Code [id number]  Doreen Salvage, MD ED      Family Communication: Spoke with wife at the bedside Disposition Plan: Status is: Inpatient  Time spent: 28 minutes  [place]

## 2021-03-13 NOTE — TOC Progression Note (Signed)
Transition of Care [facility]) - Progression Note    Patient Details  Name: Gary [name] MRN: [id number] Date of Birth: Dec 30, 1955  Transition of Care [facility] CM/SW [place], [place] Phone Number: 03/13/2021, 1:40 PM  Clinical Narrative:     CSW followed up with patient to inquire if he has reached out to family to assist him with finances as patient was previously told yesterday 11/27 that if he wishes to go to SNF he would have to sign over his Medicaid check to a facility.   Patient reports he has not followed up with family yet to ensure he can financially make this work. Reports he will do so.   CSW reiterated that patient will need to sign over monthly check to facility since he has [facility], patient expressed understanding.   CSW informed patient that his bed search has been expansive in being sent out to a large geographical region, patient currently has no bed offers.  CSW encouraged patient to determine what discharging home would look like if he continues to have no bed offers and/or he cannot sign over his monthly Medicaid check to SNF.     Expected Discharge Plan: [place] Barriers to Discharge: Continued Medical Work up  Expected Discharge Plan and Services Expected Discharge Plan: [place] arrangements for the past 2 months: Single Family Home                                       Social Determinants of Health (SDOH) Interventions    Readmission Risk Interventions Readmission Risk Prevention Plan 03/12/2021 03/01/2020 03/01/2020  Transportation Screening Complete Complete Complete  PCP or Specialist Appt within 3-5 Days Complete Not Complete Not Complete  Not Complete comments - To be scheduled by unit secretary will be scheduled by unit clerk  [place] or Home Care Consult Complete Complete Complete  Social Work Consult for [place] Planning/Counseling Complete Complete Complete  Palliative Care  Screening Not Applicable Not Applicable Not Applicable  Medication Review Press photographer) Complete Complete Complete  Some recent data might be hidden

## 2021-03-13 NOTE — Progress Notes (Signed)
Occupational Therapy Treatment Patient Details Name: Gary [name] MRN: [id number] DOB: 1956/02/15 Today's Date: 03/13/2021   History of present illness Gary [name] is a 65 y.o. male with medical history significant for COPD on home O2, morbid obesity, HFpEF, grade 3 DD, history of GI bleed OSA on nighttime BiPAP, HTN, transferred back from [place] on 03/11/21 for continued medical management ongoing medical problems patient was transferred to [facility] on 11/8 for ARDS secondary to severe COVID-19.  He was successfully extubated on 11/13 and has been gradually improving.   OT comments  Pt seen for PT/OT co-treat this date to f/u re: safety with ADLs/ADL mobility. Co-treatment completed in attempt to progress pt with taking steps as he has low functional activity tolerance. Pt requires MIN A +2 to CTS from elevated EOB surface (OT does not raise it as high as pt requests and reports that he did last time, provided pt education re: rationale behind standing from slightly lower surface each time to build muscle. Pt only moderately receptive). Pt requires increased time and encouragement this date. Upon first stand trial, OT engages pt in peri care with MOD A in static standing with bari RW for support. On second trial, OT/PT encourage pt to take some steps to bari recliner procured for him. Pt makes it to chair with ~4-5 small shuffling steps and encouragement as he endorses being fearful of falling. Once to recliner, PT/OT unsuccessful in getting pt to come to stand again to transition back to bed. While he does appear to have the functional strength to complete the transfer, he appears fearful and does not commit to leaning FWD to CTS (unless from significantly elevated bed height). Lift used at later time to get pt back to bed with TOTAL A. Pt left in bed with all needs met and in reach, RN updated. Will continue to follow.    Recommendations for follow up therapy are one component of a multi-disciplinary  discharge planning process, led by the attending physician.  Recommendations may be updated based on patient status, additional functional criteria and insurance authorization.    Follow Up Recommendations  Skilled nursing-short term rehab (<3 hours/day)    Assistance Recommended at Discharge Frequent or constant Supervision/Assistance  Equipment Recommendations  Other (comment) (defer)    Recommendations for Other Services      Precautions / Restrictions Precautions Precautions: Fall Restrictions Weight Bearing Restrictions: No       Mobility Bed Mobility Overal bed mobility: Needs Assistance Bed Mobility: Supine to Sit     Supine to sit: HOB elevated;Supervision Sit to supine: Min assist   General bed mobility comments: requires assist to bring LEs back into bed    Transfers Overall transfer level: Needs assistance Equipment used: Rolling walker (2 wheels) Transfers: Sit to/from Stand;Bed to chair/wheelchair/BSC Sit to Stand: From elevated surface;+2 safety/equipment;Min assist Stand pivot transfers: Min assist;+2 physical assistance;From elevated surface (from significantly elevated bed)         General transfer comment: Extended time to educate pt re: importance of attempting to stand from slightly lower surface than previous therapy sessions to progress skills. Pt unable to stand from chair to get back to bed, second session completed later in the day with PT, educating nurse re: use of hoyer lift to get patient back to bed Transfer via Lift Equipment:  (viking hoyer lift to get back to bed from chair)   Balance Overall balance assessment: Needs assistance Sitting-balance support: Feet supported Sitting balance-[name] Scale: Good Sitting balance -  Comments: steady sitting at edge of bed   Standing balance support: During functional activity;Bilateral upper extremity supported;Single extremity supported;Reliant on assistive device for balance Standing  balance-[name] Scale: Fair Standing balance comment: Able to alteranate hands from walker in static standing for ADLs such as peri care                           ADL either performed or assessed with clinical judgement   ADL Overall ADL's : Needs assistance/impaired                             Toileting- Clothing Manipulation and Hygiene: Moderate assistance;Maximal assistance;Sit to/from stand Toileting - Clothing Manipulation Details (indicate cue type and reason): bari RW, STS, Cues for alternating L hand from walker to contribute to posterior peri care after small BM smear with performing STS.     Functional mobility during ADLs: Minimal assistance;Rolling walker (2 wheels);+2 for safety/equipment;+2 for physical assistance (to take ~4-5 small shuffling steps from bed to chair with MOD verbal and tactile cues for weight shifting)      Extremity/Trunk Assessment              Vision       Perception     Praxis      Cognition Arousal/Alertness: Awake/alert Behavior During Therapy: WFL for tasks assessed/performed Overall Cognitive Status: Within Functional Limits for tasks assessed                                            Exercises Other Exercises Other Exercises: Ed re: importance  of standing from slightly lower surfaces each time to increase standing skills/LE strength. Pt with moderate reception, he is resistive and fearful of falling   Shoulder Instructions       General Comments      Pertinent Vitals/ Pain       Pain Assessment: Faces Faces Pain Scale: Hurts little more Pain Location: low back, bottom from sitting in chair Pain Descriptors / Indicators: Aching Pain Intervention(s): Limited activity within patient's tolerance;Monitored during session;Repositioned  Home Living                                          Prior Functioning/Environment              Frequency  Min 2X/week         Progress Toward Goals  OT Goals(current goals can now be found in the care plan section)  Progress towards OT goals: Progressing toward goals  Acute Rehab OT Goals Patient Stated Goal: to go home OT Goal Formulation: With patient Time For Goal Achievement: 03/25/21 Potential to Achieve Goals: Good  Plan Discharge plan remains appropriate    Co-evaluation    PT/OT/SLP Co-Evaluation/Treatment: Yes Reason for Co-Treatment: Complexity of the patient's impairments (multi-system involvement);For patient/therapist safety PT goals addressed during session: Mobility/safety with mobility OT goals addressed during session: ADL's and self-care      AM-PAC OT "6 Clicks" Daily Activity     Outcome Measure   Help from another person eating meals?: None Help from another person taking care of personal grooming?: A Little Help from another person toileting, which includes using toliet,  bedpan, or urinal?: A Lot Help from another person bathing (including washing, rinsing, drying)?: A Lot Help from another person to put on and taking off regular upper body clothing?: A Little Help from another person to put on and taking off regular lower body clothing?: A Lot 6 Click Score: 16    End of Session Equipment Utilized During Treatment: Gait belt;Rolling walker (2 wheels);Other (comment) (lift for back to bed from recliner)  OT Visit Diagnosis: Unsteadiness on feet (R26.81);Muscle weakness (generalized) (M62.81)   Activity Tolerance Patient tolerated treatment well   Patient Left in bed;with call bell/phone within reach;with bed alarm set (back to bed wtih bed alarm at end of second session, Up in chair with chair alarm from first session)   Nurse Communication Mobility status        Time: [phone] (som overlap with next session to account for time for second session of the day) OT Time Calculation (min): 52 min  Charges: OT General Charges $OT Visit: 1 Visit OT  Treatments $Self Care/Home Management : 8-22 mins $Therapeutic Activity: 8-22 mins  Gerrianne Scale, [place], OTR/L ascom [phone] 03/13/21, 4:46 PM

## 2021-03-13 NOTE — Progress Notes (Signed)
Physical Therapy Treatment Patient Details Name: Gary [name] MRN: [id number] DOB: 1955-07-16 Today's Date: 03/13/2021   History of Present Illness Gary [name] is a 65 y.o. male with medical history significant for COPD on home O2, morbid obesity, HFpEF, grade 3 DD, history of GI bleed OSA on nighttime BiPAP, HTN, transferred back from [place] on 03/11/21 for continued medical management ongoing medical problems patient was transferred to [facility] on 11/8 for ARDS secondary to severe COVID-19.  He was successfully extubated on 11/13 and has been gradually improving.    PT Comments    Co-tx with OT for mobility to maximize session.  2 units billed PT, 1 OT  9:58 - 10:36.   To EOB with min a x 1.  Steady in sitting.  Pt resists but is encouraged to try to stand from a lower height bed.  He prefers bed fully raised.  He does allow and does well with a lower bed height and is able to stand with min a x 2.  Pt inc of BM and care is provided.  Encouraged participation in self care and he is able to attempt x 2 but needs help to complete.  Short seated rest on bed before standing to transfer to recliner at bedside.  Pt is hesitant but encouraged to try.  He attempts several times to stand but is unable to stand.  He has good arm strength to reposition in chair and can lift hips up off seat but when he attempts to stand fear and ability to coordinate movements impairs his ability to stand fully.  He is left in chair with needs met in anticipation of Hoyer lift back to bed later.  1:15-1:32 Hoyer lift back to bed with OT and RN assist.  Discussed with RN.  If pt is going to be admitted for a significant amount of time before transfer to SNF, it would be of benefit to pt and staff to be in a room with an overhead lift system when available.  Pt is of a larger size and while Hoyer lift is doable, general ease and safety would be improved with overhead lift.    Pt is resistant to getting back up to recliner and  wishes to do standing at bedside and exercises to increase strength before getting OOB to chair again.  Education provided and stressed importance of mobility and OOB to increase strength.   Recommendations for follow up therapy are one component of a multi-disciplinary discharge planning process, led by the attending physician.  Recommendations may be updated based on patient status, additional functional criteria and insurance authorization.  Follow Up Recommendations  Skilled nursing-short term rehab (<3 hours/day)     Assistance Recommended at Discharge Intermittent Supervision/Assistance  Equipment Recommendations       Recommendations for Other Services       Precautions / Restrictions Precautions Precautions: Fall Restrictions Weight Bearing Restrictions: No     Mobility  Bed Mobility Overal bed mobility: Needs Assistance Bed Mobility: Supine to Sit     Supine to sit: HOB elevated;Supervision          Transfers Overall transfer level: Needs assistance Equipment used: Rolling walker (2 wheels)   Sit to Stand: From elevated surface;+2 safety/equipment;Min assist                Ambulation/Gait Ambulation/Gait assistance: Min assist;+2 physical assistance Gait Distance (Feet): 3 Feet Assistive device: Rolling walker (2 wheels) Gait Pattern/deviations: Step-to pattern Gait velocity: decr  General Gait Details: pt able to stand for care and participate in pericare for BM .   Stairs             Wheelchair Mobility    Modified Rankin (Stroke Patients Only)       Balance Overall balance assessment: Needs assistance Sitting-balance support: Feet supported Sitting balance-[name] Scale: Good     Standing balance support: During functional activity;Bilateral upper extremity supported;Single extremity supported;Reliant on assistive device for balance Standing balance-[name] Scale: Fair Standing balance comment: able to take hand off walker to  assist in pericare                            Cognition Arousal/Alertness: Awake/alert Behavior During Therapy: WFL for tasks assessed/performed Overall Cognitive Status: Within Functional Limits for tasks assessed                                          Exercises      General Comments        Pertinent Vitals/Pain Pain Assessment: Faces Faces Pain Scale: Hurts little more Pain Location: low back, bottom from sitting in chair Pain Descriptors / Indicators: Aching Pain Intervention(s): Limited activity within patient's tolerance;Monitored during session;Repositioned    Home Living                          Prior Function            PT Goals (current goals can now be found in the care plan section) Progress towards PT goals: Progressing toward goals    Frequency    Min 2X/week      PT Plan Current plan remains appropriate    Co-evaluation PT/OT/SLP Co-Evaluation/Treatment: Yes Reason for Co-Treatment: Complexity of the patient's impairments (multi-system involvement);For patient/therapist safety PT goals addressed during session: Mobility/safety with mobility OT goals addressed during session: ADL's and self-care      AM-PAC PT "6 Clicks" Mobility   Outcome Measure  Help needed turning from your back to your side while in a flat bed without using bedrails?: A Lot Help needed moving from lying on your back to sitting on the side of a flat bed without using bedrails?: A Lot Help needed moving to and from a bed to a chair (including a wheelchair)?: A Lot Help needed standing up from a chair using your arms (e.g., wheelchair or bedside chair)?: Total Help needed to walk in hospital room?: Total Help needed climbing 3-5 steps with a railing? : Total 6 Click Score: 9    End of Session Equipment Utilized During Treatment: Gait belt;Oxygen Activity Tolerance: Patient limited by fatigue;Patient tolerated treatment  well Patient left: in bed;with call bell/phone within reach;with bed alarm set Nurse Communication: Mobility status PT Visit Diagnosis: Other abnormalities of gait and mobility (R26.89);Muscle weakness (generalized) (M62.81);Difficulty in walking, not elsewhere classified (R26.2)     Time: 5110-2111 PT Time Calculation (min) (ACUTE ONLY): 45 min  Charges:  $Therapeutic Activity: 23-37 mins                    Chesley Noon, PTA 03/13/21, 2:01 PM

## 2021-03-14 DIAGNOSIS — I5033 Acute on chronic diastolic (congestive) heart failure: Secondary | ICD-10-CM | POA: Diagnosis not present

## 2021-03-14 DIAGNOSIS — J9601 Acute respiratory failure with hypoxia: Secondary | ICD-10-CM | POA: Diagnosis not present

## 2021-03-14 DIAGNOSIS — U071 COVID-19: Secondary | ICD-10-CM | POA: Diagnosis not present

## 2021-03-14 DIAGNOSIS — J449 Chronic obstructive pulmonary disease, unspecified: Secondary | ICD-10-CM | POA: Diagnosis not present

## 2021-03-14 MED ORDER — BISOPROLOL FUMARATE 5 MG PO TABS
2.5000 mg | ORAL_TABLET | Freq: Every day | ORAL | Status: DC
  Administered 2021-03-14 – 2021-03-20 (×7): 2.5 mg via ORAL
  Filled 2021-03-14 (×8): qty 0.5

## 2021-03-14 MED ORDER — AMMONIUM LACTATE 12 % EX LOTN
TOPICAL_LOTION | Freq: Two times a day (BID) | CUTANEOUS | Status: DC
  Filled 2021-03-14 (×3): qty 400

## 2021-03-14 NOTE — Progress Notes (Signed)
Physical Therapy Treatment Patient Details Name: Gary [name] MRN: [id number] DOB: 05-31-1955 Today's Date: 03/14/2021   History of Present Illness Gary [name] is a 65 y.o. male with medical history significant for COPD on home O2, morbid obesity, HFpEF, grade 3 DD, history of GI bleed OSA on nighttime BiPAP, HTN, transferred back from [place] on 03/11/21 for continued medical management ongoing medical problems patient was transferred to [facility] on 11/8 for ARDS secondary to severe COVID-19.  He was successfully extubated on 11/13 and has been gradually improving.    PT Comments    Pt is making gradual progress towards goals and appears motivated to participate. BRW and +2 use for safe technique. Able to perform several reps for standing from elevated bed. Pt able to tolerate standing from 19in from floor. Able to stand at bedside and perform pre gait activities. Educated on decreased eccentric control during stand->sit and there-ex at bedside. O2 on 2L throughout session with sats at 81-83% with exertion. Cues for PLB with improvement in O2 sats to 90%. Will continue to progress as able.  Recommendations for follow up therapy are one component of a multi-disciplinary discharge planning process, led by the attending physician.  Recommendations may be updated based on patient status, additional functional criteria and insurance authorization.  Follow Up Recommendations  Skilled nursing-short term rehab (<3 hours/day)     Assistance Recommended at Discharge Intermittent Supervision/Assistance  Equipment Recommendations  Other (comment);Hospital bed;Wheelchair (measurements PT);BSC/3in1 (lift)    Recommendations for Other Services       Precautions / Restrictions Precautions Precautions: Fall Restrictions Weight Bearing Restrictions: No     Mobility  Bed Mobility Overal bed mobility: Needs Assistance Bed Mobility: Supine to Sit     Supine to sit: HOB elevated;Supervision Sit to  supine: Mod assist   General bed mobility comments: needs assist for B LE and positioning. At end of session, elevated B LEs on pillows due to edema. Likes to have [facility] raised to assist with supine->sit    Transfers Overall transfer level: Needs assistance Equipment used: Rolling walker (2 wheels) Transfers: Sit to/from Stand Sit to Stand: Mod assist;+2 physical assistance;From elevated surface           General transfer comment: able to stand x 5 reps from various bed heights. Lowest height with successful stand = 19 inches from floor. BRW used and mod assist +2. Uses rocking momentum in order to stand. Wide BOS. Able to perform pre gait activities during standing, however not safe to ambulate away from bed. Decreased eccentric control    Ambulation/Gait Ambulation/Gait assistance: Min assist;+2 physical assistance Gait Distance (Feet): 2 Feet Assistive device: Rolling walker (2 wheels) Gait Pattern/deviations: Step-to pattern       General Gait Details: able to take small steps towards HOB using RW   Stairs             Wheelchair Mobility    Modified Rankin (Stroke Patients Only)       Balance Overall balance assessment: Needs assistance Sitting-balance support: Feet supported Sitting balance-[name] Scale: Good Sitting balance - Comments: steady sitting at edge of bed   Standing balance support: During functional activity;Bilateral upper extremity supported;Single extremity supported;Reliant on assistive device for balance Standing balance-[name] Scale: Fair                              Cognition Arousal/Alertness: Awake/alert Behavior During Therapy: WFL for tasks assessed/performed Overall Cognitive Status:  Within Functional Limits for tasks assessed                                          Exercises Other Exercises Other Exercises: supine/seated ther-ex performed including B LE quad sets, hip ab/add, hip add squeezes and  LAQ. All ther-ex performed x 5-10 reps. Other Exercises: able to use urinal at bedside while sitting at EOB    General Comments        Pertinent Vitals/Pain Pain Assessment: Faces Faces Pain Scale: Hurts little more Facial Expression: Tense Pain Location: L ankle and B knee Pain Descriptors / Indicators: Aching Pain Intervention(s): Limited activity within patient's tolerance;Repositioned    Home Living                          Prior Function            PT Goals (current goals can now be found in the care plan section) Acute Rehab PT Goals Patient Stated Goal: to go home PT Goal Formulation: With patient Time For Goal Achievement: 03/25/21 Potential to Achieve Goals: Fair Progress towards PT goals: Progressing toward goals    Frequency    Min 2X/week      PT Plan Current plan remains appropriate    Co-evaluation              AM-PAC PT "6 Clicks" Mobility   Outcome Measure  Help needed turning from your back to your side while in a flat bed without using bedrails?: A Little Help needed moving from lying on your back to sitting on the side of a flat bed without using bedrails?: A Little Help needed moving to and from a bed to a chair (including a wheelchair)?: A Lot Help needed standing up from a chair using your arms (e.g., wheelchair or bedside chair)?: Total Help needed to walk in hospital room?: Total Help needed climbing 3-5 steps with a railing? : Total 6 Click Score: 11    End of Session Equipment Utilized During Treatment: Gait belt;Oxygen Activity Tolerance: Patient limited by fatigue;Patient tolerated treatment well Patient left: in bed;with bed alarm set Nurse Communication: Mobility status PT Visit Diagnosis: Other abnormalities of gait and mobility (R26.89);Muscle weakness (generalized) (M62.81);Difficulty in walking, not elsewhere classified (R26.2)     Time: 1330-1410 PT Time Calculation (min) (ACUTE ONLY): 40 min  Charges:   $Therapeutic Exercise: 23-37 mins $Therapeutic Activity: 8-22 mins                     Greggory Stallion, PT, DPT [phone]    Gary [name] 03/14/2021, 3:17 PM

## 2021-03-14 NOTE — Progress Notes (Signed)
Mobility Specialist - Progress Note   03/14/21 1400  Mobility  Activity Dangled on edge of bed  Level of Assistance Standby assist, set-up cues, supervision of patient - no hands on  Assistive Device None  Distance Ambulated (ft) 0 ft  Mobility Sit up in bed/chair position for meals  Mobility Response Tolerated well  Mobility performed by Mobility specialist  $Mobility charge 1 Mobility    Pt participated in seated therex while EOB. Utilizing 2.5L   Kathee Delton Mobility Specialist 03/14/21, 2:57 PM

## 2021-03-14 NOTE — Progress Notes (Addendum)
Patient ID: Gary [name], male   DOB: September 17, 1955, 65 y.o.   MRN: [id number] [facility] PROGRESS NOTE  Gary [name] TLX:[id number] DOB: 03-23-1956 DOA: 03/10/2021 PCP: [facility]  HPI/Subjective: Patient feeling better.  Still feeling weak.  Breathing okay.  Down to 2.5 L of oxygen.  Initially admitted 02/18/2021 with acute hypoxic respiratory failure and COVID-19 pneumonia.  Objective: Vitals:   03/14/21 0749 03/14/21 1158  BP:  112/70  Pulse:  85  Resp:  18  Temp:  98.3 F (36.8 C)  SpO2: 95% 94%    Intake/Output Summary (Last 24 hours) at 03/14/2021 1310 Last data filed at 03/14/2021 1035 Gross per 24 hour  Intake 720 ml  Output 970 ml  Net -250 ml   Filed Weights   03/11/21 0037  Weight: (!) 187.2 kg    ROS: Review of Systems  Respiratory:  Positive for shortness of breath. Negative for cough.   Cardiovascular:  Negative for chest pain.  Gastrointestinal:  Negative for abdominal pain, nausea and vomiting.  Exam: Physical Exam HENT:     Head: Normocephalic.     Mouth/Throat:     Pharynx: No oropharyngeal exudate.  Eyes:     General: Lids are normal.     Conjunctiva/sclera: Conjunctivae normal.  Cardiovascular:     Rate and Rhythm: Normal rate and regular rhythm.     Heart sounds: Normal heart sounds, S1 normal and S2 normal.  Pulmonary:     Breath sounds: Examination of the right-lower field reveals decreased breath sounds. Examination of the left-lower field reveals decreased breath sounds. Decreased breath sounds present. No wheezing, rhonchi or rales.  Abdominal:     Palpations: Abdomen is soft.     Tenderness: There is no abdominal tenderness.  Musculoskeletal:     Right lower leg: Swelling present.     Left lower leg: Swelling present.  Skin:    General: Skin is warm.     Findings: No rash.     Comments: Dried cracked skin bilateral feet  Neurological:     Mental Status: He is alert.     Comments: Patient answers all  questions appropriately.      Scheduled Meds:  ammonium lactate   Topical BID   budesonide  0.25 mg Nebulization BID   enoxaparin (LOVENOX) injection  0.5 mg/kg Subcutaneous Q24H   ferrous sulfate  325 mg Oral BID WC   nystatin  5 mL Oral QID   pantoprazole  40 mg Oral Daily   simvastatin  10 mg Oral q1800   torsemide  40 mg Oral Daily   umeclidinium-vilanterol  1 puff Inhalation Daily    Brief history: 65 year old man with past medical history of chronic hypoxic hypercapnic respiratory failure, acute on chronic diastolic congestive heart failure, morbid obesity, COPD, sleep apnea.  Patient initially presented on 02/18/2021 with acute on chronic hypoxic hypercapnic respiratory failure secondary to COVID-19 pneumonia.  Patient was transferred over to [facility] for consideration for ECMO but did not require that.  Was gradually improving and transferred back to [facility].  Today down to 2.5 L of oxygen.  Physical therapy recommending rehab.  Assessment/Plan:  Acute hypoxic, hypercapnic respiratory failure.  PCO2 was up as high as 105 at 1 point during the hospital course.  Patient is on BiPAP at night here in the hospital.  Does have trilogy at home as per [facility].  Patient on 2.5 L of oxygen this morning. ARDS secondary to severe COVID-19 infection.  Patient is  out of isolation since diagnosis was on 02/18/2021.  Continue torsemide. Acute on chronic diastolic congestive heart failure.  Continue torsemide 40 mg daily.  We will try to add low-dose bisoprolol at night. Morbid obesity with a BMI of 51.58. Obstructive sleep apnea on BiPAP at night COPD.  Continue inhalers Dry skin bilateral feet start Lac-Hydrin lotion Weakness.  Physical therapy recommending rehab.  Transitional care team informing at rounds there were no bed offers at this point. Acute kidney injury.  On 02/22/2021 creatinine was 1.55.  On 03/13/2001 creatinine 1.03.     Code Status:     Code Status Orders  (From admission,  onward)           Start     Ordered   03/11/21 0156  Full code  Continuous        03/11/21 0156           Code Status History     Date Active Date Inactive Code Status Order ID Comments User Context   02/18/2021 2114 02/23/2021 0759 Full Code [id number]  Rust-Chester, Huel Cote, NP ED   02/25/2020 1310 03/05/2020 2206 Full Code [id number]  [name], MD ED   08/04/2019 1910 08/10/2019 2239 Full Code [id number]  Mansy, Arvella Merles, MD ED   05/26/2019 1806 06/01/2019 1711 Full Code [id number]  Loletha Grayer, MD ED   10/14/2018 0330 10/22/2018 1951 Full Code [id number]  Lance Coon, MD Inpatient   06/25/2018 0026 06/28/2018 1842 Full Code [id number]  Lance Coon, MD ED   05/29/2018 0001 05/31/2018 1732 Full Code [id number]  Vaughan Basta, MD Inpatient   04/12/2017 1353 04/15/2017 1720 Full Code [id number]  Fritzi Mandes, MD Inpatient   01/10/2015 1953 01/14/2015 1703 Full Code [id number]  Aldean Jewett, MD Inpatient   03/27/2014 1847 04/02/2014 1512 Full Code [id number]  Doreen Salvage, MD ED      Family Communication: Updated family yesterday Disposition Plan: Status is: Inpatient.  Transitional care team notified me that there is no bed offers for rehab at this point.  Continue working with PT and OT while here in the hospital to build up strength.  Time spent: 27 minutes  Taylor

## 2021-03-15 DIAGNOSIS — J9611 Chronic respiratory failure with hypoxia: Secondary | ICD-10-CM | POA: Diagnosis not present

## 2021-03-15 MED ORDER — ORAL CARE MOUTH RINSE
15.0000 mL | Freq: Two times a day (BID) | OROMUCOSAL | Status: DC
  Administered 2021-03-15 – 2021-03-21 (×6): 15 mL via OROMUCOSAL

## 2021-03-15 NOTE — Progress Notes (Signed)
PROGRESS NOTE    Gary [name]   HDQ:[id number]  DOB: April 17, 1955  PCP: [facility] [place]    DOA: 03/10/2021 LOS: 5    Brief Narrative / Hospital Course to Date:   65 year old man with past medical history of chronic hypoxic hypercapnic respiratory failure, acute on chronic diastolic congestive heart failure, morbid obesity, COPD, sleep apnea.  Patient initially presented on 02/18/2021 with acute on chronic hypoxic hypercapnic respiratory failure secondary to COVID-19 pneumonia.  Patient was transferred over to [facility] for consideration for ECMO but did not require that. Was gradually improving and transferred back to [facility].  Now with profound generalized weakness.  Today on 3-4 L/min of oxygen.   PT recommending rehab.    Assessment & Plan   Principal Problem:   Chronic respiratory failure with hypoxia ([name]) Active Problems:   Morbid obesity with BMI of 50.0-59.9, adult ([name])   HTN (hypertension)   Acute on chronic diastolic (congestive) heart failure ([name])   Obstructive sleep apnea   COPD (chronic obstructive pulmonary disease) ([name])   Acute respiratory distress syndrome (ARDS) due to COVID-19 virus ([name])   (HFpEF) heart failure with preserved ejection fraction ([name])   Pneumonia   Acute respiratory failure with hypoxia and hypercapnia ([name])   Weakness   Acute hypoxic, hypercapnic respiratory failure.  PCO2 was up as high as 105 during the hospital course.   --on BiPAP at night here  --Has trilogy at home as per TOC.  Requiring 3-4 L/min O2 today Wean o2 as tolerated, keep sats 88-94%  ARDS secondary to severe COVID-19 infection.  Patient is out of isolation since diagnosis was on 02/18/2021.  Continue torsemide.  Acute on chronic diastolic congestive heart failure.  Continue torsemide 40 mg daily.  We will try to add low-dose bisoprolol at night.  Obstructive sleep apnea on BiPAP at night  COPD.  Continue bronchodilators  Dry skin bilateral feet -  Lac-Hydrin lotion  Generalized Weakness, Debility.  PT recommending rehabPer TOC, no bed offers at this point.  Pt will need to sign over Medicaid checks to pay for rehab. Placement pending vs Home with max home health services, but not enough caregiver support available.   Acute kidney injury.  On 02/22/2021 creatinine was 1.55.  On 03/13/2001 creatinine 1.03.  Morbid Obesity: Body mass index is 51.58 kg/m.   DVT prophylaxis:    Diet:  Diet Orders (From admission, onward)     Start     Ordered   03/11/21 0156  Diet Heart Room service appropriate? Yes; Fluid consistency: Thin  Diet effective now       Question Answer Comment  Room service appropriate? Yes   Fluid consistency: Thin      03/11/21 0156              Code Status: Full Code   Subjective 03/15/21    Pt seen with wife at bedside today.   He wants to get out of hospital soon.  Understands he is very weak.  They're not sure about signing over medicaid check for rehab.  He had pain in his L ankle earlier, Tylenol helped.  No other acute complaints at this time.    Disposition Plan & Communication   Status is: Inpatient  Remains inpatient appropriate because: requires SNF placement for rehab due to profound weakness, inadequate caregiver support at home Family Communication: wife at bedside today   Consults, Procedures, Significant Events   Consultants:  none  Procedures:  none  Antimicrobials:  Anti-infectives (From admission, onward)    Start     Dose/Rate Route Frequency Ordered Stop   03/11/21 1000  cefTRIAXone (ROCEPHIN) 2 g in sodium chloride 0.9 % 100 mL IVPB        2 g 200 mL/hr over 30 Minutes Intravenous Every 24 hours 03/11/21 0847 03/12/21 1703         Micro    Objective   Vitals:   03/15/21 0000 03/15/21 0742 03/15/21 1111 03/15/21 1611  BP: 110/74 109/75 99/60 108/67  Pulse: 85 78 75 82  Resp: 19 16 17 16   Temp: 98.3 F (36.8 C) 98 F (36.7 C) 97.9 F (36.6 C) 98 F (36.7  C)  TempSrc: Oral   Oral  SpO2: 93% 97% 98% 94%  Weight:      Height:        Intake/Output Summary (Last 24 hours) at 03/15/2021 1936 Last data filed at 03/15/2021 1854 Gross per 24 hour  Intake 620 ml  Output 625 ml  Net -5 ml   Filed Weights   03/11/21 0037  Weight: (!) 187.2 kg    Physical Exam:  General exam: awake, alert, no acute distress, morbidly obese HEENT: moist mucus membranes, hearing grossly normal  Respiratory system: CTAB, no wheezes, rales or rhonchi, normal respiratory effort. Cardiovascular system: normal S1/S2,  RRR   Gastrointestinal system: soft, NT, ND, no HSM felt, +bowel sounds. Central nervous system: A&O x3. no gross focal neurologic deficits, normal speech Skin: dry, intact, normal temperature Psychiatry: normal mood, congruent affect, judgement and insight appear normal  Labs   Data Reviewed: I have personally reviewed following labs and imaging studies  CBC: Recent Labs  Lab 03/11/21 0223 03/12/21 0601 03/13/21 0648  WBC 8.7 7.7 7.3  NEUTROABS  --  5.5 5.1  HGB 12.3* 11.9* 12.1*  HCT 43.4 42.8 45.3  MCV 82.8 82.9 86.3  PLT 262 247 300   Basic Metabolic Panel: Recent Labs  Lab 03/11/21 0223 03/12/21 0601 03/13/21 0648  NA 138 138 141  K 3.6 3.5 3.7  CL 91* 92* 96*  CO2 40* 40* 38*  GLUCOSE 107* 95 114*  BUN 34* 32* 32*  CREATININE 1.05 1.00 1.03  CALCIUM 8.7* 8.6* 8.4*  MG  --  2.0 2.2   GFR: Estimated Creatinine Clearance: 130.4 mL/min (by C-G formula based on SCr of 1.03 mg/dL). Liver Function Tests: No results for input(s): AST, ALT, ALKPHOS, BILITOT, PROT, ALBUMIN in the last 168 hours. No results for input(s): LIPASE, AMYLASE in the last 168 hours. No results for input(s): AMMONIA in the last 168 hours. Coagulation Profile: No results for input(s): INR, PROTIME in the last 168 hours. Cardiac Enzymes: No results for input(s): CKTOTAL, CKMB, CKMBINDEX, TROPONINI in the last 168 hours. BNP (last 3 results) No  results for input(s): PROBNP in the last 8760 hours. HbA1C: No results for input(s): HGBA1C in the last 72 hours. CBG: No results for input(s): GLUCAP in the last 168 hours. Lipid Profile: No results for input(s): CHOL, HDL, LDLCALC, TRIG, CHOLHDL, LDLDIRECT in the last 72 hours. Thyroid Function Tests: No results for input(s): TSH, T4TOTAL, FREET4, T3FREE, THYROIDAB in the last 72 hours. Anemia Panel: No results for input(s): VITAMINB12, FOLATE, FERRITIN, TIBC, IRON, RETICCTPCT in the last 72 hours. Sepsis Labs: No results for input(s): PROCALCITON, LATICACIDVEN in the last 168 hours.  No results found for this or any previous visit (from the past 240 hour(s)).    Imaging Studies   No results found.  Medications   Scheduled Meds:  ammonium lactate   Topical BID   bisoprolol  2.5 mg Oral QHS   budesonide  0.25 mg Nebulization BID   enoxaparin (LOVENOX) injection  0.5 mg/kg Subcutaneous Q24H   ferrous sulfate  325 mg Oral BID WC   mouth rinse  15 mL Mouth Rinse BID   nystatin  5 mL Oral QID   pantoprazole  40 mg Oral Daily   simvastatin  10 mg Oral q1800   torsemide  40 mg Oral Daily   umeclidinium-vilanterol  1 puff Inhalation Daily   Continuous Infusions:     LOS: 5 days    Time spent: 30 minutes    Ezekiel Slocumb, DO [facility]  03/15/2021, 7:36 PM      If 7PM-7AM, please contact night-coverage. How to contact the [facility] Attending or Consulting provider [place] or covering provider during after hours [place], for this patient?    Check the care team in [facility] and look for a) attending/consulting TRH provider listed and b) the [facility] team listed Log into [email or web address] and use [place]'s universal password to access. If you do not have the password, please contact the hospital operator. Locate the [facility] provider you are looking for under [facility] and page to a number that you can be directly reached. If you still have difficulty reaching the  provider, please page the [facility] (Director on Call) for the Hospitalists listed on amion for assistance.

## 2021-03-15 NOTE — Progress Notes (Signed)
Mobility Specialist - Progress Note   03/15/21 1400  Mobility  Activity Refused mobility  Mobility performed by Mobility specialist    Pt declined mobility, no reason specified. Reports fatigue from working with therapy moments prior to entry, request to rest. Will attempt session another date.    Kathee Delton Mobility Specialist 03/15/21, 2:59 PM

## 2021-03-15 NOTE — Progress Notes (Addendum)
Physical Therapy Treatment Patient Details Name: Gary [name] MRN: [id number] DOB: 12-27-1955 Today's Date: 03/15/2021   History of Present Illness Gary [name] is a 65 y.o. male with medical history significant for COPD on home O2, morbid obesity, HFpEF, grade 3 DD, history of GI bleed OSA on nighttime BiPAP, HTN, transferred back from [place] on 03/11/21 for continued medical management ongoing medical problems patient was transferred to [facility] on 11/8 for ARDS secondary to severe COVID-19.  He was successfully extubated on 11/13 and has been gradually improving.    PT Comments    Pt is making gradual progress towards goals with ability to perform multiple sit<>Stands from lower surface today, able to decrease height by 1.5in from previous session. Only requires +1 for sit<>Stand this date, however anticipate +2 for mobility away from bed. Pt encouraged and continues to be motivated to participate. Asking for theraband to use with B UE. Able to perform supine/seated there-ex on B LE. Still needs significant assist for standing and demonstrates poor tolerance for static standing, not safe to ambulate this date. Will continue to progress as able. To fully optimize for home discharge, all of careteam including patient need to encourage mobility efforts.  Recommendations for follow up therapy are one component of a multi-disciplinary discharge planning process, led by the attending physician.  Recommendations may be updated based on patient status, additional functional criteria and insurance authorization.  Follow Up Recommendations  Skilled nursing-short term rehab (<3 hours/day)     Assistance Recommended at Discharge Intermittent Supervision/Assistance  Equipment Recommendations  Other (comment);Hospital bed;Wheelchair (measurements PT);BSC/3in1    Recommendations for Other Services       Precautions / Restrictions Precautions Precautions: Fall Restrictions Weight Bearing Restrictions:  No     Mobility  Bed Mobility Overal bed mobility: Needs Assistance Bed Mobility: Supine to Sit     Supine to sit: HOB elevated;Supervision Sit to supine: Mod assist   General bed mobility comments: prefers HOB elevated to approx 50 degrees prior to transition to sitting. Once seated, able to sit with supervision. Needs mod assist for returning B LE back to bed    Transfers Overall transfer level: Needs assistance Equipment used: Rolling walker (2 wheels) Transfers: Sit to/from Stand Sit to Stand: Mod assist           General transfer comment: able to stand x 6 reps from multiple heights. Able to stand approx 10-20 seconds each rep. Needs rocking momentum prior to attempt and heavy use of B UE.    Ambulation/Gait               General Gait Details: too fatigued to perform this date   Stairs             Wheelchair Mobility    Modified Rankin (Stroke Patients Only)       Balance Overall balance assessment: Needs assistance Sitting-balance support: Feet supported Sitting balance-[name] Scale: Good Sitting balance - Comments: steady sitting at edge of bed   Standing balance support: During functional activity;Bilateral upper extremity supported;Single extremity supported;Reliant on assistive device for balance Standing balance-[name] Scale: Fair                              Cognition Arousal/Alertness: Awake/alert Behavior During Therapy: WFL for tasks assessed/performed Overall Cognitive Status: Within Functional Limits for tasks assessed  Exercises Other Exercises Other Exercises: supine/seated ther-ex performed on B LE including AP, quad sets, SLR (with mod assist), and LAQ. All therex performed with min assist. 4L of O2 donned for all mobility at 94%.    General Comments        Pertinent Vitals/Pain Pain Assessment: Faces Faces Pain Scale: Hurts little more Pain  Location: R knee Pain Descriptors / Indicators: Aching Pain Intervention(s): Limited activity within patient's tolerance;Repositioned;Premedicated before session    Home Living                          Prior Function            PT Goals (current goals can now be found in the care plan section) Acute Rehab PT Goals Patient Stated Goal: to go home PT Goal Formulation: With patient Time For Goal Achievement: 03/25/21 Potential to Achieve Goals: Fair Progress towards PT goals: Progressing toward goals    Frequency    Min 2X/week      PT Plan Current plan remains appropriate    Co-evaluation              AM-PAC PT "6 Clicks" Mobility   Outcome Measure  Help needed turning from your back to your side while in a flat bed without using bedrails?: A Little Help needed moving from lying on your back to sitting on the side of a flat bed without using bedrails?: A Little Help needed moving to and from a bed to a chair (including a wheelchair)?: A Lot Help needed standing up from a chair using your arms (e.g., wheelchair or bedside chair)?: Total Help needed to walk in hospital room?: Total Help needed climbing 3-5 steps with a railing? : Total 6 Click Score: 11    End of Session Equipment Utilized During Treatment: Gait belt;Oxygen Activity Tolerance: Patient limited by fatigue;Patient tolerated treatment well Patient left: in bed;with bed alarm set Nurse Communication: Mobility status PT Visit Diagnosis: Other abnormalities of gait and mobility (R26.89);Muscle weakness (generalized) (M62.81);Difficulty in walking, not elsewhere classified (R26.2)     Time: 6384-5364 PT Time Calculation (min) (ACUTE ONLY): 32 min  Charges:  $Therapeutic Exercise: 8-22 mins $Therapeutic Activity: 8-22 mins                     Greggory Stallion, PT, DPT [phone]    Hamilton Marinello 03/15/2021, 4:12 PM

## 2021-03-16 LAB — CBC
HCT: 42.2 % (ref 39.0–52.0)
Hemoglobin: 11.3 g/dL — ABNORMAL LOW (ref 13.0–17.0)
MCH: 24 pg — ABNORMAL LOW (ref 26.0–34.0)
MCHC: 26.8 g/dL — ABNORMAL LOW (ref 30.0–36.0)
MCV: 89.6 fL (ref 80.0–100.0)
Platelets: 178 10*3/uL (ref 150–400)
RBC: 4.71 MIL/uL (ref 4.22–5.81)
RDW: 17.7 % — ABNORMAL HIGH (ref 11.5–15.5)
WBC: 6.3 10*3/uL (ref 4.0–10.5)
nRBC: 0.9 % — ABNORMAL HIGH (ref 0.0–0.2)

## 2021-03-16 NOTE — Progress Notes (Signed)
Patient drowsy and unable to participate in daily care activities. Per nursing tech who is familiar with patient, this is not patient baseline. Patient endorsed that he was unsure if he has been compliant with cpap at night. Arbutus Ped, MD alerted.

## 2021-03-16 NOTE — Progress Notes (Signed)
OT Cancellation Note  Patient Details Name: Gary [name] MRN: [id number] DOB: 08-14-1955   Cancelled Treatment:    Reason Eval/Treat Not Completed: Fatigue/lethargy limiting ability to participate When OT checked on patient for treatment this PM, he was asleep in bed and wearing Bipap. With maximal effort to rouse pt including light, auditory stimuli and sternal rub, he would only grimace or flutter eyes open for one second and then be asleep again. Pt too lethargic to participate with OT at this time. RN aware. Will f/u next available date/time. Thank you.  Gerrianne Scale, [place], OTR/L ascom [phone] 03/16/21, 4:30 PM

## 2021-03-16 NOTE — Progress Notes (Signed)
Entered patient room to reassess patient condition post bipap application. Patient appeared more confused. Checked patient O2 saturation and it was 65%. Placed patient back on 4L O2. Contacted respiratory therapist Velva Harman and requested that patient be placed on full size bipap, so that nursing can monitor and be alerted to flow errors or removal of mask.

## 2021-03-16 NOTE — Progress Notes (Signed)
Bipap removed for dinner. Patient on nasal cannula. Patient is more alert. Wife is at bedside and endorses patient is at baseline. Patient is agreeable that he will wear bipap while sleeping tonight.

## 2021-03-16 NOTE — Progress Notes (Addendum)
Mobility Specialist - Progress Note   03/16/21 1416  Mobility  Activity Contraindicated/medical hold  Mobility performed by Mobility specialist    Pt supine in bed, utilizing 5L on arrival with sats at 80%. RN notified and entered to address issue. O2 bumped to 6L with sats improving to 86%. RT notified, pt placed on bi-pap at this time---pt quickly fell asleep upon exit. Session concluded.    Kathee Delton Mobility Specialist 03/16/21, 2:43 PM

## 2021-03-16 NOTE — TOC Progression Note (Signed)
Transition of Care [facility]) - Progression Note    Patient Details  Name: Gary [name] MRN: [id number] Date of Birth: Aug 07, 1955  Transition of Care [facility] CM/SW [place], [place] Phone Number: 03/16/2021, 3:12 PM  Clinical Narrative:     CSW notes patient continues to have no bed offers and would need to give Medicaid check up monthly as well.   CSW spoke with patient's wife (as patient is confused and on bipap) [place] at [phone] she reports being in agreement with patient receiving a wheel chair, hospital bed, hoyer lift and 3in1 to be delivered to the home. Patient already has a walker and cane.   CSW has requested orders from MD.   She reports understanding in that patient has no bed offers and she is preparing for him to come home at time of discharge. Reports being agreeable ot home health services, CSW informed her we will work on finding a [place] agency to accept his medicaid.   No further questions or concerns at this time.     Expected Discharge Plan: [place] Barriers to Discharge: Continued Medical Work up  Expected Discharge Plan and Services Expected Discharge Plan: [place] arrangements for the past 2 months: Single Family Home                                       Social Determinants of Health (SDOH) Interventions    Readmission Risk Interventions Readmission Risk Prevention Plan 03/12/2021 03/01/2020 03/01/2020  Transportation Screening Complete Complete Complete  PCP or Specialist Appt within 3-5 Days Complete Not Complete Not Complete  Not Complete comments - To be scheduled by unit secretary will be scheduled by unit clerk  [place] or Home Care Consult Complete Complete Complete  Social Work Consult for [place] Planning/Counseling Complete Complete Complete  Palliative Care Screening Not Applicable Not Applicable Not Applicable  Medication Review Press photographer) Complete Complete  Complete  Some recent data might be hidden

## 2021-03-16 NOTE — Progress Notes (Signed)
Patient CO2 elevated on VBG. Advised patient that he would need to go on bipap after completion of lunch. Patient agreeable. Contacted respiratory therapist Velva Harman to alert.

## 2021-03-16 NOTE — Progress Notes (Signed)
PROGRESS NOTE    Gary [name]   JJO:[id number]  DOB: 08/09/1955  PCP: [facility] [place]    DOA: 03/10/2021 LOS: 6    Brief Narrative / Hospital Course to Date:   65 year old man with past medical history of chronic hypoxic hypercapnic respiratory failure, acute on chronic diastolic congestive heart failure, morbid obesity, COPD, sleep apnea.  Patient initially presented on 02/18/2021 with acute on chronic hypoxic hypercapnic respiratory failure secondary to COVID-19 pneumonia.  Patient was transferred over to [facility] for consideration for ECMO but did not require that. Was gradually improving and transferred back to [facility].  Now with profound generalized weakness.  Today on 3-4 L/min of oxygen.   PT recommending rehab.    Assessment & Plan   Principal Problem:   Chronic respiratory failure with hypoxia ([name]) Active Problems:   Morbid obesity with BMI of 50.0-59.9, adult ([name])   HTN (hypertension)   Acute on chronic diastolic (congestive) heart failure ([name])   Obstructive sleep apnea   COPD (chronic obstructive pulmonary disease) ([name])   Acute respiratory distress syndrome (ARDS) due to COVID-19 virus ([name])   (HFpEF) heart failure with preserved ejection fraction ([name])   Pneumonia   Acute respiratory failure with hypoxia and hypercapnia ([name])   Weakness   Acute metabolic encephalopathy - 12/1 pt lethargic, somnolent, difficult to arouse. --Check VBG - not collected yet -  re-order for AM  ?recurrent hypercapnea --Further evaluation pending pCO2 --BiPAP overnight and when asleep  Acute hypoxic, hypercapnic respiratory failure.  PCO2 was up as high as 105 during the hospital course.   12/1 - suspect recurrence, eval as above --on BiPAP at night here  --Has trilogy at home as per TOC.  Requiring 3-4 L/min O2 today Wean o2 as tolerated, keep sats 88-94%  ARDS secondary to severe COVID-19 infection.  Patient is out of isolation since diagnosis was on  02/18/2021.  Continue torsemide.  Acute on chronic diastolic congestive heart failure.  Continue torsemide 40 mg daily.  We will try to add low-dose bisoprolol at night.  Obstructive sleep apnea on BiPAP at night  COPD.  Continue bronchodilators  Dry skin bilateral feet - Lac-Hydrin lotion  Generalized Weakness, Debility.  PT recommending rehabPer TOC, no bed offers at this point.  Pt will need to sign over Medicaid checks to pay for rehab. Placement pending vs Home with max home health services, but not enough caregiver support available.   Acute kidney injury.  On 02/22/2021 creatinine was 1.55.  On 03/13/2001 creatinine 1.03.  Morbid Obesity: Body mass index is 51.53 kg/m.   DVT prophylaxis:    Diet:  Diet Orders (From admission, onward)     Start     Ordered   03/11/21 0156  Diet Heart Room service appropriate? Yes; Fluid consistency: Thin  Diet effective now       Question Answer Comment  Room service appropriate? Yes   Fluid consistency: Thin      03/11/21 0156              Code Status: Full Code   Subjective 03/16/21    Pt reportedly more lethargic today per nursing. When seen on rounds, patient sleeping, yelling out in his sleep at times, snoring loudly, nasal cannula in place.  He barely arouses to loud voice and sternal rub. Nursing reported he complained earlier in the AM about feeling very tired today.   Disposition Plan & Communication   Status is: Inpatient  Remains inpatient appropriate because:  requires SNF placement for rehab due to profound weakness, inadequate caregiver support at home  Family Communication: wife at bedside 11/30   Consults, Procedures, Significant Events   Consultants:  none  Procedures:  none  Antimicrobials:  Anti-infectives (From admission, onward)    Start     Dose/Rate Route Frequency Ordered Stop   03/11/21 1000  cefTRIAXone (ROCEPHIN) 2 g in sodium chloride 0.9 % 100 mL IVPB        2 g 200 mL/hr over 30  Minutes Intravenous Every 24 hours 03/11/21 0847 03/12/21 1703         Micro    Objective   Vitals:   03/16/21 1208 03/16/21 1208 03/16/21 1649 03/16/21 1936  BP: 113/76 113/76    Pulse: 80 82 80   Resp: 18 18 20    Temp: (!) 96.3 F (35.7 C) (!) 96.3 F (35.7 C)    TempSrc:      SpO2:  (!) 87% 93% 95%  Weight:      Height:        Intake/Output Summary (Last 24 hours) at 03/16/2021 2044 Last data filed at 03/16/2021 1830 Gross per 24 hour  Intake 1740 ml  Output 500 ml  Net 1240 ml   Filed Weights   03/11/21 0037 03/16/21 0500  Weight: (!) 187.2 kg (!) 187 kg    Physical Exam:  General exam: somnolent, minimal arouses, no acute distress, morbidly obese, talking in his sleep Respiratory system: CTAB but diminished, no wheezes, rales or rhonchi, on 4 L/min O2, snoring Cardiovascular system: normal S1/S2,  RRR   Gastrointestinal system: soft, non-tender Central nervous system: Somnolent and unable to follow commands   Skin: dry, intact, normal temperature Psychiatry: unable to assess  Labs   Data Reviewed: I have personally reviewed following labs and imaging studies  CBC: Recent Labs  Lab 03/11/21 0223 03/12/21 0601 03/13/21 0648 03/16/21 0429  WBC 8.7 7.7 7.3 6.3  NEUTROABS  --  5.5 5.1  --   HGB 12.3* 11.9* 12.1* 11.3*  HCT 43.4 42.8 45.3 42.2  MCV 82.8 82.9 86.3 89.6  PLT 262 247 210 637   Basic Metabolic Panel: Recent Labs  Lab 03/11/21 0223 03/12/21 0601 03/13/21 0648  NA 138 138 141  K 3.6 3.5 3.7  CL 91* 92* 96*  CO2 40* 40* 38*  GLUCOSE 107* 95 114*  BUN 34* 32* 32*  CREATININE 1.05 1.00 1.03  CALCIUM 8.7* 8.6* 8.4*  MG  --  2.0 2.2   GFR: Estimated Creatinine Clearance: 130.3 mL/min (by C-G formula based on SCr of 1.03 mg/dL). Liver Function Tests: No results for input(s): AST, ALT, ALKPHOS, BILITOT, PROT, ALBUMIN in the last 168 hours. No results for input(s): LIPASE, AMYLASE in the last 168 hours. No results for input(s):  AMMONIA in the last 168 hours. Coagulation Profile: No results for input(s): INR, PROTIME in the last 168 hours. Cardiac Enzymes: No results for input(s): CKTOTAL, CKMB, CKMBINDEX, TROPONINI in the last 168 hours. BNP (last 3 results) No results for input(s): PROBNP in the last 8760 hours. HbA1C: No results for input(s): HGBA1C in the last 72 hours. CBG: No results for input(s): GLUCAP in the last 168 hours. Lipid Profile: No results for input(s): CHOL, HDL, LDLCALC, TRIG, CHOLHDL, LDLDIRECT in the last 72 hours. Thyroid Function Tests: No results for input(s): TSH, T4TOTAL, FREET4, T3FREE, THYROIDAB in the last 72 hours. Anemia Panel: No results for input(s): VITAMINB12, FOLATE, FERRITIN, TIBC, IRON, RETICCTPCT in the last 72 hours.  Sepsis Labs: No results for input(s): PROCALCITON, LATICACIDVEN in the last 168 hours.  No results found for this or any previous visit (from the past 240 hour(s)).    Imaging Studies   No results found.   Medications   Scheduled Meds:  ammonium lactate   Topical BID   bisoprolol  2.5 mg Oral QHS   budesonide  0.25 mg Nebulization BID   enoxaparin (LOVENOX) injection  0.5 mg/kg Subcutaneous Q24H   ferrous sulfate  325 mg Oral BID WC   mouth rinse  15 mL Mouth Rinse BID   nystatin  5 mL Oral QID   pantoprazole  40 mg Oral Daily   simvastatin  10 mg Oral q1800   torsemide  40 mg Oral Daily   umeclidinium-vilanterol  1 puff Inhalation Daily   Continuous Infusions:     LOS: 6 days    Time spent: 28 minutes with > 50% spent at bedside and in coordination of care     Ezekiel Slocumb, DO [facility]  03/16/2021, 8:44 PM      If 7PM-7AM, please contact night-coverage. How to contact the [facility] Attending or Consulting provider [place] or covering provider during after hours [place], for this patient?    Check the care team in [facility] and look for a) attending/consulting TRH provider listed and b) the [facility] team listed Log into  [email or web address] and use [place]'s universal password to access. If you do not have the password, please contact the hospital operator. Locate the [facility] provider you are looking for under [facility] and page to a number that you can be directly reached. If you still have difficulty reaching the provider, please page the [facility] (Director on Call) for the Hospitalists listed on amion for assistance.

## 2021-03-17 ENCOUNTER — Inpatient Hospital Stay: Payer: Medicaid Other

## 2021-03-17 DIAGNOSIS — J9611 Chronic respiratory failure with hypoxia: Secondary | ICD-10-CM | POA: Diagnosis not present

## 2021-03-17 LAB — BLOOD GAS, VENOUS
Acid-Base Excess: 14.4 mmol/L — ABNORMAL HIGH (ref 0.0–2.0)
Acid-Base Excess: 14.2 mmol/L — ABNORMAL HIGH (ref 0.0–2.0)
Bicarbonate: 46.5 mmol/L — ABNORMAL HIGH (ref 20.0–28.0)
Bicarbonate: 46.4 mmol/L — ABNORMAL HIGH (ref 20.0–28.0)
FIO2: 0.35
O2 Saturation: 64.2 %
O2 Saturation: 81 %
Patient temperature: 37
Patient temperature: 37
pCO2, Ven: 111 mmHg (ref 44.0–60.0)
pCO2, Ven: 116 mmHg (ref 44.0–60.0)
pH, Ven: 7.23 — ABNORMAL LOW (ref 7.250–7.430)
pH, Ven: 7.21 — ABNORMAL LOW (ref 7.250–7.430)
pO2, Ven: 40 mmHg (ref 32.0–45.0)
pO2, Ven: 55 mmHg — ABNORMAL HIGH (ref 32.0–45.0)

## 2021-03-17 LAB — CBC
HCT: 45.3 % (ref 39.0–52.0)
Hemoglobin: 11.9 g/dL — ABNORMAL LOW (ref 13.0–17.0)
MCH: 24 pg — ABNORMAL LOW (ref 26.0–34.0)
MCHC: 26.3 g/dL — ABNORMAL LOW (ref 30.0–36.0)
MCV: 91.5 fL (ref 80.0–100.0)
Platelets: DECREASED 10*3/uL (ref 150–400)
RBC: 4.95 MIL/uL (ref 4.22–5.81)
RDW: 18.4 % — ABNORMAL HIGH (ref 11.5–15.5)
WBC: 5.7 10*3/uL (ref 4.0–10.5)
nRBC: 1.4 % — ABNORMAL HIGH (ref 0.0–0.2)

## 2021-03-17 LAB — BLOOD GAS, ARTERIAL
Acid-Base Excess: 11.2 mmol/L — ABNORMAL HIGH (ref 0.0–2.0)
Bicarbonate: 35.1 mmol/L — ABNORMAL HIGH (ref 20.0–28.0)
Delivery systems: POSITIVE
Expiratory PAP: 10
FIO2: 0.35
Inspiratory PAP: 18
O2 Saturation: 99.7 %
Patient temperature: 37
pCO2 arterial: 43 mmHg (ref 32.0–48.0)
pH, Arterial: 7.52 — ABNORMAL HIGH (ref 7.350–7.450)
pO2, Arterial: 191 mmHg — ABNORMAL HIGH (ref 83.0–108.0)

## 2021-03-17 LAB — BASIC METABOLIC PANEL
Anion gap: 3 — ABNORMAL LOW (ref 5–15)
BUN: 29 mg/dL — ABNORMAL HIGH (ref 8–23)
CO2: 40 mmol/L — ABNORMAL HIGH (ref 22–32)
Calcium: 8.3 mg/dL — ABNORMAL LOW (ref 8.9–10.3)
Chloride: 98 mmol/L (ref 98–111)
Creatinine, Ser: 1.14 mg/dL (ref 0.61–1.24)
GFR, Estimated: >60 mL/min (ref >60)
Glucose, Bld: 114 mg/dL — ABNORMAL HIGH (ref 70–99)
Potassium: 4.8 mmol/L (ref 3.5–5.1)
Sodium: 141 mmol/L (ref 135–145)

## 2021-03-17 LAB — GLUCOSE, CAPILLARY: Glucose-Capillary: 98 mg/dL (ref 70–99)

## 2021-03-17 MED ORDER — REVEFENACIN 175 MCG/3ML IN SOLN
175.0000 ug | Freq: Every day | RESPIRATORY_TRACT | Status: DC
Start: 2021-03-17 — End: 2021-03-18
  Administered 2021-03-17 – 2021-03-18 (×2): 175 ug via RESPIRATORY_TRACT
  Filled 2021-03-17 (×2): qty 3

## 2021-03-17 MED ORDER — DEXMEDETOMIDINE HCL IN NACL 400 MCG/100ML IV SOLN
0.4000 ug/kg/h | INTRAVENOUS | Status: DC
  Administered 2021-03-17: 0.4 ug/kg/h via INTRAVENOUS
  Filled 2021-03-17: qty 100

## 2021-03-17 MED ORDER — ALBUTEROL SULFATE (2.5 MG/3ML) 0.083% IN NEBU
2.5000 mg | INHALATION_SOLUTION | RESPIRATORY_TRACT | Status: DC
Start: 2021-03-17 — End: 2021-03-18
  Administered 2021-03-17 – 2021-03-18 (×8): 2.5 mg via RESPIRATORY_TRACT
  Filled 2021-03-17 (×8): qty 3

## 2021-03-17 MED ORDER — CHLORHEXIDINE GLUCONATE CLOTH 2 % EX PADS
6.0000 | MEDICATED_PAD | Freq: Every day | CUTANEOUS | Status: DC
  Administered 2021-03-17 – 2021-03-18 (×2): 6 via TOPICAL

## 2021-03-17 MED ORDER — ACETAZOLAMIDE SODIUM 500 MG IJ SOLR
500.0000 mg | Freq: Once | INTRAMUSCULAR | Status: AC
  Administered 2021-03-17: 500 mg via INTRAVENOUS
  Filled 2021-03-17: qty 500

## 2021-03-17 NOTE — Progress Notes (Signed)
Cross Cover Patient with worsening respiratory acidosis by ABG but no change in mentation Increase BIPAP support 18/10 and increase set rate. Repeat ABG 1200

## 2021-03-17 NOTE — Progress Notes (Signed)
Spoke with Dr. Arbutus Ped to make aware patient co2 was 111. Patient is unresponsive, only moans with sternal rub. Respiratory done a repeat abg, current co2 is 116. Per md she will speak with dr Mortimer Fries and patient to be transported to CCU

## 2021-03-17 NOTE — Progress Notes (Signed)
OT Cancellation Note  Patient Details Name: ARVID MARENGO MRN: [id number] DOB: 22-Jun-1955   Cancelled Treatment:    Reason Eval/Treat Not Completed: Other (comment) Pt transferred to ICU d/t change in status with confusion and lethargy 2/2 increased CO2 levels, currently on Bipap. Per protocol, will require new OT orders to resume services if/when pt becomes appropriate to participate. Thank you.  Gerrianne Scale, [place], OTR/L ascom [phone] 03/17/21, 1:14 PM
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
## 2021-03-17 NOTE — Progress Notes (Signed)
Report called to [facility] RN. Patient to be transported to room 8. Respiratory and krista CNA to assist with transport

## 2021-03-17 NOTE — Progress Notes (Signed)
Spoke with patients wife to make aware of patient status and that he will be going to [facility] to be intubated

## 2021-03-17 NOTE — TOC Progression Note (Signed)
Transition of Care [facility]) - Progression Note    Patient Details  Name: Gary [name] MRN: [id number] Date of Birth: [birth year]  Transition of Care [facility]) CM/SW [place], [place] Phone Number: 03/17/2021, 8:59 AM  Clinical Narrative:     CSW notes patient to be transferred to ICU level of care, DME needs held at this time.   Once patient is medically stable, will need to order wheel chair, hospital bed, hoyer lift and 3in1 to be delivered to the home. Patient already has a walker and cane.    CSW informed patient's spouse that we will work on finding a [place] agency to accept his medicaid.   Expected Discharge Plan: [place] Barriers to Discharge: Continued Medical Work up  Expected Discharge Plan and Services Expected Discharge Plan: [place] arrangements for the past 2 months: Single Family Home                                       Social Determinants of Health (SDOH) Interventions    Readmission Risk Interventions Readmission Risk Prevention Plan 03/12/2021 03/01/2020 03/01/2020  Transportation Screening Complete Complete Complete  PCP or Specialist Appt within 3-5 Days Complete Not Complete Not Complete  Not Complete comments - To be scheduled by unit secretary will be scheduled by unit clerk  [place] or Home Care Consult Complete Complete Complete  Social Work Consult for [place] Planning/Counseling Complete Complete Complete  Palliative Care Screening Not Applicable Not Applicable Not Applicable  Medication Review Press photographer) Complete Complete Complete  Some recent data might be hidden

## 2021-03-17 NOTE — Progress Notes (Signed)
PT Cancellation Note  Patient Details Name: Gary [name] MRN: [id number] DOB: May 13, 1955   Cancelled Treatment:    Reason Eval/Treat Not Completed: Medical issues which prohibited therapy;Patient not medically ready (Per chart, pt's respiratory status and mentation in decline since previous day, plans now for transfer to higher level of care. Please update PT orders once pt is to resume our services.)  9:13 AM, 03/17/21 Etta Grandchild, PT, DPT Physical Therapist - [facility]  [phone] ([place])     [place] C 03/17/2021, 9:13 AM

## 2021-03-17 NOTE — Progress Notes (Signed)
PROGRESS NOTE    Gary [name]   KZL:[id number]  DOB: 1955-12-05  PCP: [facility] [place]    DOA: 03/10/2021 LOS: 7    Brief Narrative / Hospital Course to Date:   65 year old man with past medical history of chronic hypoxic hypercapnic respiratory failure, acute on chronic diastolic congestive heart failure, morbid obesity, COPD, sleep apnea.  Patient initially presented on 02/18/2021 with acute on chronic hypoxic hypercapnic respiratory failure secondary to COVID-19 pneumonia.  Patient was transferred over to [facility] for consideration for ECMO but did not require that. Was gradually improving and transferred back to [facility].  Now with profound generalized weakness.  Today on 3-4 L/min of oxygen.   PT recommending rehab.    Assessment & Plan   Principal Problem:   Chronic respiratory failure with hypoxia ([name]) Active Problems:   Morbid obesity with BMI of 50.0-59.9, adult ([name])   HTN (hypertension)   Acute on chronic diastolic (congestive) heart failure ([name])   Obstructive sleep apnea   COPD (chronic obstructive pulmonary disease) ([name])   Acute respiratory distress syndrome (ARDS) due to COVID-19 virus ([name])   (HFpEF) heart failure with preserved ejection fraction ([name])   Pneumonia   Acute respiratory failure with hypoxia and hypercapnia ([name])   Weakness   Recurrent hypercapnic respiratory failure -12/1-2 VBG showed PCO2 of 111, further increased to 116 despite increased BiPAP settings to 18/10 overnight. --PCCM consulted and patient transferred to ICU --See PCCM recommendations, appreciate assistance   Acute metabolic encephalopathy - 12/1 pt lethargic, somnolent, difficult to arouse.   12/2 patient unresponsive on BiPAP --Treat underlying conditions, primarily hypercapnic respiratory failure --BiPAP overnight and when asleep --Delirium precautions --Monitor closely  Acute hypoxic, hypercapnic respiratory failure.  PCO2 was up as high as 105 during  the hospital course.   12/1 - suspect recurrence, eval as above --on BiPAP at night here  --Has trilogy at home as per TOC.  --Wean o2 as tolerated, keep sats 88-94% --Continue Pulmicort nebs, scheduled albuterol nebs  ARDS secondary to severe COVID-19 infection.   Patient is out of isolation since diagnosis was on 02/18/2021. Continue torsemide.  Acute on chronic diastolic congestive heart failure.  Continue torsemide 40 mg daily.  Added low-dose bisoprolol at night. Monitor volume status -I/O's and daily weights --Acetazolamide given 12/2 for diuresis  Obstructive sleep apnea on BiPAP at night  COPD with acute exacerbation.  Continue bronchodilators. Low threshold to resume steroids.  Primary respiratory issue seems to be obesity hypoventilation/OSA and hypercapnia  Dry skin bilateral feet - Lac-Hydrin lotion  Generalized Weakness, Debility.  PT recommending rehabPer TOC, no bed offers at this point.  Pt will need to sign over Medicaid checks to pay for rehab. Placement pending vs Home with max home health services, but not enough caregiver support available.   Acute kidney injury.  On 02/22/2021 creatinine was 1.55.  On 03/13/2001 creatinine 1.03.  Iron deficiency -continue daily supplement  Morbid Obesity: Body mass index is 51.53 kg/m.   DVT prophylaxis:    Diet:  Diet Orders (From admission, onward)     Start     Ordered   03/11/21 0156  Diet Heart Room service appropriate? Yes; Fluid consistency: Thin  Diet effective now       Question Answer Comment  Room service appropriate? Yes   Fluid consistency: Thin      03/11/21 0156              Code Status: Full Code   Subjective  03/17/21    Patient had worsening hypercapnic respiratory failure despite BiPAP overnight.  Settings had been increased but this morning PCO2 had further increased.  Patient unresponsive when seen this morning.  Transferred to ICU anticipating possible need for intubation.   Disposition  Plan & Communication   Status is: Inpatient  Remains inpatient appropriate because: Patient respiratory decompensation this morning and transferred to ICU on BiPAP. Requires SNF placement for rehab due to profound weakness, inadequate caregiver support at home  Family Communication: wife at bedside 11/30   Consults, Procedures, Significant Events   Consultants:  none  Procedures:  none  Antimicrobials:  Anti-infectives (From admission, onward)    Start     Dose/Rate Route Frequency Ordered Stop   03/11/21 1000  cefTRIAXone (ROCEPHIN) 2 g in sodium chloride 0.9 % 100 mL IVPB        2 g 200 mL/hr over 30 Minutes Intravenous Every 24 hours 03/11/21 0847 03/12/21 1703         Micro    Objective   Vitals:   03/17/21 1200 03/17/21 1300 03/17/21 1400 03/17/21 1500  BP: 113/86 108/80 (!) 118/92 (!) 148/125  Pulse: 63 66 66 72  Resp: (!) 24 (!) 25 16 18   Temp: 98.5 F (36.9 C)     TempSrc: Axillary     SpO2: 93% 93% 98% 100%  Weight:      Height:        Intake/Output Summary (Last 24 hours) at 03/17/2021 1651 Last data filed at 03/17/2021 1400 Gross per 24 hour  Intake 720 ml  Output 540 ml  Net 180 ml   Filed Weights   03/11/21 0037 03/16/21 0500 03/17/21 0500  Weight: (!) 187.2 kg (!) 187 kg (!) 187 kg    Physical Exam:  General exam: Unresponsive, no acute distress, morbidly obese Respiratory system: On BiPAP at 18/10, expiratory wheezes and referred upper airway sounds Cardiovascular system: normal S1/S2,  RRR   Gastrointestinal system: soft, non-tender Central nervous system: Unresponsive to vigorous sternal rub and loud vocal stimulus Skin: dry, intact, normal temperature Psychiatry: unable to assess at this time  Labs   Data Reviewed: I have personally reviewed following labs and imaging studies  CBC: Recent Labs  Lab 03/11/21 0223 03/12/21 0601 03/13/21 0648 03/16/21 0429 03/17/21 0555  WBC 8.7 7.7 7.3 6.3 5.7  NEUTROABS  --  5.5 5.1  --    --   HGB 12.3* 11.9* 12.1* 11.3* 11.9*  HCT 43.4 42.8 45.3 42.2 45.3  MCV 82.8 82.9 86.3 89.6 91.5  PLT 262 247 210 178 PLATELET CLUMPS NOTED ON SMEAR, COUNT APPEARS DECREASED   Basic Metabolic Panel: Recent Labs  Lab 03/11/21 0223 03/12/21 0601 03/13/21 0648 03/17/21 0555  NA 138 138 141 141  K 3.6 3.5 3.7 4.8  CL 91* 92* 96* 98  CO2 40* 40* 38* 40*  GLUCOSE 107* 95 114* 114*  BUN 34* 32* 32* 29*  CREATININE 1.05 1.00 1.03 1.14  CALCIUM 8.7* 8.6* 8.4* 8.3*  MG  --  2.0 2.2  --    GFR: Estimated Creatinine Clearance: 117.7 mL/min (by C-G formula based on SCr of 1.14 mg/dL). Liver Function Tests: No results for input(s): AST, ALT, ALKPHOS, BILITOT, PROT, ALBUMIN in the last 168 hours. No results for input(s): LIPASE, AMYLASE in the last 168 hours. No results for input(s): AMMONIA in the last 168 hours. Coagulation Profile: No results for input(s): INR, PROTIME in the last 168 hours. Cardiac Enzymes: No results for  input(s): CKTOTAL, CKMB, CKMBINDEX, TROPONINI in the last 168 hours. BNP (last 3 results) No results for input(s): PROBNP in the last 8760 hours. HbA1C: No results for input(s): HGBA1C in the last 72 hours. CBG: Recent Labs  Lab 03/17/21 0949  GLUCAP 98   Lipid Profile: No results for input(s): CHOL, HDL, LDLCALC, TRIG, CHOLHDL, LDLDIRECT in the last 72 hours. Thyroid Function Tests: No results for input(s): TSH, T4TOTAL, FREET4, T3FREE, THYROIDAB in the last 72 hours. Anemia Panel: No results for input(s): VITAMINB12, FOLATE, FERRITIN, TIBC, IRON, RETICCTPCT in the last 72 hours. Sepsis Labs: No results for input(s): PROCALCITON, LATICACIDVEN in the last 168 hours.  No results found for this or any previous visit (from the past 240 hour(s)).    Imaging Studies   DG Chest Port 1 View  Result Date: 03/17/2021 CLINICAL DATA:  Respiratory failure, unconscious, on BiPAP EXAM: PORTABLE CHEST 1 VIEW COMPARISON:  Portable exam 1031 hours compared to  02/22/2021 FINDINGS: Enlargement of cardiac silhouette with pulmonary vascular congestion. Subsegmental atelectasis LEFT mid lung. Additional patchy opacities at the lower lungs could represent atelectasis or infiltrate. No pleural effusion or pneumothorax. Osseous structures unremarkable. IMPRESSION: Bibasilar opacities question atelectasis versus infiltrate. Subsegmental atelectasis LEFT mid lung. Enlargement of cardiac silhouette with pulmonary vascular congestion. Electronically Signed   By: Lavonia Dana M.D.   On: 03/17/2021 10:44     Medications   Scheduled Meds:  albuterol  2.5 mg Nebulization Q4H   ammonium lactate   Topical BID   bisoprolol  2.5 mg Oral QHS   budesonide  0.25 mg Nebulization BID   Chlorhexidine Gluconate Cloth  6 each Topical Daily   enoxaparin (LOVENOX) injection  0.5 mg/kg Subcutaneous Q24H   ferrous sulfate  325 mg Oral BID WC   mouth rinse  15 mL Mouth Rinse BID   nystatin  5 mL Oral QID   pantoprazole  40 mg Oral Daily   revefenacin  175 mcg Nebulization Daily   simvastatin  10 mg Oral q1800   torsemide  40 mg Oral Daily   Continuous Infusions:  dexmedetomidine (PRECEDEX) IV infusion Stopped (03/17/21 1200)       LOS: 7 days    Time spent: 35 minutes with > 50% spent at bedside and in coordination of care     Ezekiel Slocumb, DO [facility]  03/17/2021, 4:51 PM      If 7PM-7AM, please contact night-coverage. How to contact the [facility] Attending or Consulting provider [place] or covering provider during after hours [place], for this patient?    Check the care team in [facility] and look for a) attending/consulting TRH provider listed and b) the [facility] team listed Log into [email or web address] and use [place]'s universal password to access. If you do not have the password, please contact the hospital operator. Locate the [facility] provider you are looking for under [facility] and page to a number that you can be directly reached. If you still have  difficulty reaching the provider, please page the [facility] (Director on Call) for the Hospitalists listed on amion for assistance.

## 2021-03-17 NOTE — Progress Notes (Signed)
GOALS OF CARE DISCUSSION  The Clinical status was relayed to family in detail. Sisters at bedside  Updated and notified of patients medical condition. Patient on biPAP  Patient with Progressive multiorgan failure with a very high probablity of a very minimal chance of meaningful recovery despite all aggressive and optimal medical therapy.  PATIENT REMAINS FULL CODE  Family understands the situation.   Family are satisfied with Plan of action and management. All questions answered  Additional CC time 25 mins   Felicita Nuncio Patricia Pesa, M.D.  [facility]  Medical Director [place] Director [facility] Cardio-Pulmonary Department

## 2021-03-18 DIAGNOSIS — J9611 Chronic respiratory failure with hypoxia: Secondary | ICD-10-CM | POA: Diagnosis not present

## 2021-03-18 LAB — BASIC METABOLIC PANEL
Anion gap: 5 (ref 5–15)
BUN: 27 mg/dL — ABNORMAL HIGH (ref 8–23)
CO2: 34 mmol/L — ABNORMAL HIGH (ref 22–32)
Calcium: 8.4 mg/dL — ABNORMAL LOW (ref 8.9–10.3)
Chloride: 98 mmol/L (ref 98–111)
Creatinine, Ser: 0.94 mg/dL (ref 0.61–1.24)
GFR, Estimated: >60 mL/min (ref >60)
Glucose, Bld: 87 mg/dL (ref 70–99)
Potassium: 4.3 mmol/L (ref 3.5–5.1)
Sodium: 137 mmol/L (ref 135–145)

## 2021-03-18 LAB — CBC
HCT: 42.8 % (ref 39.0–52.0)
Hemoglobin: 11.6 g/dL — ABNORMAL LOW (ref 13.0–17.0)
MCH: 23.9 pg — ABNORMAL LOW (ref 26.0–34.0)
MCHC: 27.1 g/dL — ABNORMAL LOW (ref 30.0–36.0)
MCV: 88.1 fL (ref 80.0–100.0)
Platelets: 130 10*3/uL — ABNORMAL LOW (ref 150–400)
RBC: 4.86 MIL/uL (ref 4.22–5.81)
RDW: 18.6 % — ABNORMAL HIGH (ref 11.5–15.5)
WBC: 5.9 10*3/uL (ref 4.0–10.5)
nRBC: 2.4 % — ABNORMAL HIGH (ref 0.0–0.2)

## 2021-03-18 LAB — BLOOD GAS, VENOUS
Acid-Base Excess: 14.4 mmol/L — ABNORMAL HIGH (ref 0.0–2.0)
Bicarbonate: 42.1 mmol/L — ABNORMAL HIGH (ref 20.0–28.0)
O2 Saturation: 73.6 %
Patient temperature: 37
pCO2, Ven: 68 mmHg — ABNORMAL HIGH (ref 44.0–60.0)
pH, Ven: 7.4 (ref 7.250–7.430)
pO2, Ven: 39 mmHg (ref 32.0–45.0)

## 2021-03-18 LAB — PHOSPHORUS: Phosphorus: 2.2 mg/dL — ABNORMAL LOW (ref 2.5–4.6)

## 2021-03-18 LAB — MAGNESIUM: Magnesium: 2.2 mg/dL (ref 1.7–2.4)

## 2021-03-18 MED ORDER — ALBUTEROL SULFATE (2.5 MG/3ML) 0.083% IN NEBU
2.5000 mg | INHALATION_SOLUTION | Freq: Four times a day (QID) | RESPIRATORY_TRACT | Status: DC
  Administered 2021-03-18: 2.5 mg via RESPIRATORY_TRACT

## 2021-03-18 MED ORDER — REVEFENACIN 175 MCG/3ML IN SOLN
175.0000 ug | Freq: Every day | RESPIRATORY_TRACT | Status: DC
  Administered 2021-03-19 – 2021-03-21 (×3): 175 ug via RESPIRATORY_TRACT
  Filled 2021-03-18 (×5): qty 3

## 2021-03-18 NOTE — Progress Notes (Signed)
Pt transferred to 2A room 234 via bed.  Report given to [facility], Therapist, sports.  POC reviewed and continued.  Pt on 2L [place] during transport and denies resp distress.

## 2021-03-18 NOTE — Progress Notes (Signed)
PROGRESS NOTE    Gary [name]   GUY:[id number]  DOB: 1956-04-11  PCP: [facility] [place]    DOA: 03/10/2021 LOS: 8    Brief Narrative / Hospital Course to Date:   65 year old man with past medical history of chronic hypoxic hypercapnic respiratory failure, acute on chronic diastolic congestive heart failure, morbid obesity, COPD, sleep apnea.  Patient initially presented on 02/18/2021 with acute on chronic hypoxic hypercapnic respiratory failure secondary to COVID-19 pneumonia.  Patient was transferred over to [facility] for consideration for ECMO but did not require that. Was gradually improving and transferred back to [facility].  Now with profound generalized weakness.  Today on 3-4 L/min of oxygen.   PT recommending rehab.    Assessment & Plan   Principal Problem:   Chronic respiratory failure with hypoxia ([name]) Active Problems:   Morbid obesity with BMI of 50.0-59.9, adult ([name])   HTN (hypertension)   Acute on chronic diastolic (congestive) heart failure ([name])   Obstructive sleep apnea   COPD (chronic obstructive pulmonary disease) ([name])   Acute respiratory distress syndrome (ARDS) due to COVID-19 virus ([name])   (HFpEF) heart failure with preserved ejection fraction ([name])   Pneumonia   Acute respiratory failure with hypoxia and hypercapnia ([name])   Weakness   Recurrent hypercapnic respiratory failure -12/1-2 VBG showed PCO2 of 111, further increased to 116 despite increased BiPAP settings to 18/10 overnight. --PCCM consulted and patient transferred to ICU --See PCCM recommendations, appreciate assistance   Acute metabolic encephalopathy - 12/1 pt lethargic, somnolent, difficult to arouse.   12/2 patient unresponsive on BiPAP 12/3 - resolved after more aggressive bipap settings in ICU --Treat underlying conditions, primarily hypercapnic respiratory failure --BiPAP overnight and when asleep --Delirium precautions --Monitor closely  Acute hypoxic,  hypercapnic respiratory failure.  PCO2 was up as high as 105 during the hospital course.   12/1 - suspect recurrence, eval as above 12/2 - transferred to ICU unresponsive with pCO2 111>>116 despite bipap 12/3 - improved, normal mental status pCO improved to 68 --on BiPAP at night here  --Has trilogy at home as per TOC.  --Wean o2 as tolerated, keep sats 88-94% --Continue Pulmicort nebs, scheduled albuterol nebs  ARDS secondary to severe COVID-19 infection.   Patient is out of isolation since diagnosis was on 02/18/2021. Continue torsemide.  Acute on chronic diastolic congestive heart failure.  Continue torsemide 40 mg daily.  Added low-dose bisoprolol at night. Monitor volume status -I/O's and daily weights --Acetazolamide given 12/2 for diuresis  Obstructive sleep apnea on BiPAP at night  COPD with acute exacerbation.  Continue bronchodilators. Low threshold to resume steroids.  Primary respiratory issue seems to be obesity hypoventilation/OSA and hypercapnia  Dry skin bilateral feet - Lac-Hydrin lotion  Generalized Weakness, Debility.  PT recommending rehabPer TOC, no bed offers at this point.  Pt will need to sign over Medicaid checks to pay for rehab. Placement pending vs Home with max home health services, but not enough caregiver support available.   Acute kidney injury.  On 02/22/2021 creatinine was 1.55.  On 03/13/2001 creatinine 1.03.  Iron deficiency -continue daily supplement  Morbid Obesity: Body mass index is 51.53 kg/m.   DVT prophylaxis:    Diet:  Diet Orders (From admission, onward)     Start     Ordered   03/11/21 0156  Diet Heart Room service appropriate? Yes; Fluid consistency: Thin  Diet effective now       Question Answer Comment  Room service appropriate? Yes  Fluid consistency: Thin      03/11/21 0156              Code Status: Full Code   Subjective 03/18/21    Patient awake and feeling better this AM, seen in ICU.  Tolerates Bipap well,  expresses understanding he needs to use it whenever sleeping.  Wants to get out of ICU, different bed, doesn't like the air mattress repositioning him.  No other acute complaints.   Disposition Plan & Communication   Status is: Inpatient  Remains inpatient appropriate because: Closely monitoring warranted for recurrent hypercapea.   Requires DME and HH set up given unable to place at SNF for rehab.    Family Communication: wife at bedside 11/30   Consults, Procedures, Significant Events   Consultants:  none  Procedures:  none  Antimicrobials:  Anti-infectives (From admission, onward)    Start     Dose/Rate Route Frequency Ordered Stop   03/11/21 1000  cefTRIAXone (ROCEPHIN) 2 g in sodium chloride 0.9 % 100 mL IVPB        2 g 200 mL/hr over 30 Minutes Intravenous Every 24 hours 03/11/21 0847 03/12/21 1703         Micro    Objective   Vitals:   03/18/21 1200 03/18/21 1300 03/18/21 1500 03/18/21 1600  BP: 99/65 109/70 115/78 112/73  Pulse: 77 75 83 81  Resp: 19 15 (!) 22 17  Temp: 98.1 F (36.7 C)     TempSrc: Oral     SpO2: 92% 91% 96% 98%  Weight:      Height:        Intake/Output Summary (Last 24 hours) at 03/18/2021 1824 Last data filed at 03/18/2021 1700 Gross per 24 hour  Intake 97.93 ml  Output 1500 ml  Net -1402.07 ml   Filed Weights   03/11/21 0037 03/16/21 0500 03/17/21 0500  Weight: (!) 187.2 kg (!) 187 kg (!) 187 kg    Physical Exam:  General exam: awake, alert, no acute distress, morbidly obese Respiratory system: CTAB, no wheezes or rhonchi, on 2 L/min [place] O2 with normal respiratory effort Cardiovascular system: normal S1/S2,  RRR   Central nervous system: A&Ox3, normal speech, grossly non-focal exam Skin: dry, intact, normal temperature  Labs   Data Reviewed: I have personally reviewed following labs and imaging studies  CBC: Recent Labs  Lab 03/12/21 0601 03/13/21 0648 03/16/21 0429 03/17/21 0555 03/18/21 0419  WBC 7.7 7.3  6.3 5.7 5.9  NEUTROABS 5.5 5.1  --   --   --   HGB 11.9* 12.1* 11.3* 11.9* 11.6*  HCT 42.8 45.3 42.2 45.3 42.8  MCV 82.9 86.3 89.6 91.5 88.1  PLT 247 210 178 PLATELET CLUMPS NOTED ON SMEAR, COUNT APPEARS DECREASED 604*   Basic Metabolic Panel: Recent Labs  Lab 03/12/21 0601 03/13/21 0648 03/17/21 0555 03/18/21 0419  NA 138 141 141 137  K 3.5 3.7 4.8 4.3  CL 92* 96* 98 98  CO2 40* 38* 40* 34*  GLUCOSE 95 114* 114* 87  BUN 32* 32* 29* 27*  CREATININE 1.00 1.03 1.14 0.94  CALCIUM 8.6* 8.4* 8.3* 8.4*  MG 2.0 2.2  --  2.2  PHOS  --   --   --  2.2*   GFR: Estimated Creatinine Clearance: 142.8 mL/min (by C-G formula based on SCr of 0.94 mg/dL). Liver Function Tests: No results for input(s): AST, ALT, ALKPHOS, BILITOT, PROT, ALBUMIN in the last 168 hours. No results for input(s): LIPASE, AMYLASE  in the last 168 hours. No results for input(s): AMMONIA in the last 168 hours. Coagulation Profile: No results for input(s): INR, PROTIME in the last 168 hours. Cardiac Enzymes: No results for input(s): CKTOTAL, CKMB, CKMBINDEX, TROPONINI in the last 168 hours. BNP (last 3 results) No results for input(s): PROBNP in the last 8760 hours. HbA1C: No results for input(s): HGBA1C in the last 72 hours. CBG: Recent Labs  Lab 03/17/21 0949  GLUCAP 98   Lipid Profile: No results for input(s): CHOL, HDL, LDLCALC, TRIG, CHOLHDL, LDLDIRECT in the last 72 hours. Thyroid Function Tests: No results for input(s): TSH, T4TOTAL, FREET4, T3FREE, THYROIDAB in the last 72 hours. Anemia Panel: No results for input(s): VITAMINB12, FOLATE, FERRITIN, TIBC, IRON, RETICCTPCT in the last 72 hours. Sepsis Labs: No results for input(s): PROCALCITON, LATICACIDVEN in the last 168 hours.  No results found for this or any previous visit (from the past 240 hour(s)).    Imaging Studies   DG Chest Port 1 View  Result Date: 03/17/2021 CLINICAL DATA:  Respiratory failure, unconscious, on BiPAP EXAM: PORTABLE  CHEST 1 VIEW COMPARISON:  Portable exam 1031 hours compared to 02/22/2021 FINDINGS: Enlargement of cardiac silhouette with pulmonary vascular congestion. Subsegmental atelectasis LEFT mid lung. Additional patchy opacities at the lower lungs could represent atelectasis or infiltrate. No pleural effusion or pneumothorax. Osseous structures unremarkable. IMPRESSION: Bibasilar opacities question atelectasis versus infiltrate. Subsegmental atelectasis LEFT mid lung. Enlargement of cardiac silhouette with pulmonary vascular congestion. Electronically Signed   By: Lavonia Dana M.D.   On: 03/17/2021 10:44     Medications   Scheduled Meds:  albuterol  2.5 mg Nebulization Q4H   ammonium lactate   Topical BID   bisoprolol  2.5 mg Oral QHS   budesonide  0.25 mg Nebulization BID   Chlorhexidine Gluconate Cloth  6 each Topical Daily   enoxaparin (LOVENOX) injection  0.5 mg/kg Subcutaneous Q24H   ferrous sulfate  325 mg Oral BID WC   mouth rinse  15 mL Mouth Rinse BID   nystatin  5 mL Oral QID   pantoprazole  40 mg Oral Daily   [START ON 03/19/2021] revefenacin  175 mcg Nebulization Daily   simvastatin  10 mg Oral q1800   torsemide  40 mg Oral Daily   Continuous Infusions:  dexmedetomidine (PRECEDEX) IV infusion Stopped (03/17/21 1200)       LOS: 8 days    Time spent: 25 minutes with > 50% spent at bedside and in coordination of care     Ezekiel Slocumb, DO [facility]  03/18/2021, 6:24 PM      If 7PM-7AM, please contact night-coverage. How to contact the [facility] Attending or Consulting provider [place] or covering provider during after hours [place], for this patient?    Check the care team in [facility] and look for a) attending/consulting TRH provider listed and b) the [facility] team listed Log into [email or web address] and use [place]'s universal password to access. If you do not have the password, please contact the hospital operator. Locate the [facility] provider you are looking for under [facility] and page to a number that you can be directly reached. If you still have difficulty reaching the provider, please page the [facility] (Director on Call) for the Hospitalists listed on amion for assistance.

## 2021-03-18 NOTE — Progress Notes (Signed)
   03/17/21 0851  Assess: MEWS Score  Level of Consciousness Unresponsive  Assess: MEWS Score  MEWS Temp 0  MEWS Systolic 0  MEWS Pulse 0  MEWS RR 0  MEWS LOC 3  MEWS Score 3  MEWS Score Color Yellow  Assess: if the MEWS score is Yellow or Red  Were vital signs taken at a resting state? Yes  Focused Assessment Change from prior assessment (see assessment flowsheet)  MEWS guidelines implemented *See Row Information* Yes  Treat  MEWS Interventions Escalated (See documentation below)  Take Vital Signs  Increase Vital Sign Frequency  Yellow: Q 2hr X 2 then Q 4hr X 2, if remains yellow, continue Q 4hrs  Escalate  MEWS: Escalate Yellow: discuss with charge nurse/RN and consider discussing with provider and RRT  Notify: Charge Nurse/RN  Name of Charge Nurse/RN Notified Janett Billow  Date Charge Nurse/RN Notified 03/17/21  Time Charge Nurse/RN Notified [id number]  Notify: Provider  Provider Name/Title griffith  Date Provider Notified 03/17/21  Time Provider Notified [phone]  Notification Type Face-to-face  Notification Reason Critical result  Provider response See new orders  Date of Provider Response 03/17/21  Time of Provider Response [phone]

## 2021-03-19 DIAGNOSIS — I10 Essential (primary) hypertension: Secondary | ICD-10-CM | POA: Diagnosis not present

## 2021-03-19 DIAGNOSIS — J9601 Acute respiratory failure with hypoxia: Secondary | ICD-10-CM | POA: Diagnosis not present

## 2021-03-19 DIAGNOSIS — J441 Chronic obstructive pulmonary disease with (acute) exacerbation: Secondary | ICD-10-CM

## 2021-03-19 DIAGNOSIS — I5033 Acute on chronic diastolic (congestive) heart failure: Secondary | ICD-10-CM | POA: Diagnosis not present

## 2021-03-19 DIAGNOSIS — D696 Thrombocytopenia, unspecified: Secondary | ICD-10-CM

## 2021-03-19 LAB — BLOOD GAS, VENOUS
Acid-Base Excess: 11.3 mmol/L — ABNORMAL HIGH (ref 0.0–2.0)
Bicarbonate: 38.4 mmol/L — ABNORMAL HIGH (ref 20.0–28.0)
O2 Saturation: 97.4 %
Patient temperature: 37
pCO2, Ven: 62 mmHg — ABNORMAL HIGH (ref 44.0–60.0)
pH, Ven: 7.4 (ref 7.250–7.430)
pO2, Ven: 95 mmHg — ABNORMAL HIGH (ref 32.0–45.0)

## 2021-03-19 LAB — BASIC METABOLIC PANEL
Anion gap: 7 (ref 5–15)
BUN: 26 mg/dL — ABNORMAL HIGH (ref 8–23)
CO2: 32 mmol/L (ref 22–32)
Calcium: 8.4 mg/dL — ABNORMAL LOW (ref 8.9–10.3)
Chloride: 98 mmol/L (ref 98–111)
Creatinine, Ser: 1.08 mg/dL (ref 0.61–1.24)
GFR, Estimated: >60 mL/min (ref >60)
Glucose, Bld: 89 mg/dL (ref 70–99)
Potassium: 4 mmol/L (ref 3.5–5.1)
Sodium: 137 mmol/L (ref 135–145)

## 2021-03-19 LAB — PHOSPHORUS: Phosphorus: 3.1 mg/dL (ref 2.5–4.6)

## 2021-03-19 LAB — CBC
HCT: 40.7 % (ref 39.0–52.0)
Hemoglobin: 11.3 g/dL — ABNORMAL LOW (ref 13.0–17.0)
MCH: 23.7 pg — ABNORMAL LOW (ref 26.0–34.0)
MCHC: 27.8 g/dL — ABNORMAL LOW (ref 30.0–36.0)
MCV: 85.5 fL (ref 80.0–100.0)
Platelets: 119 10*3/uL — ABNORMAL LOW (ref 150–400)
RBC: 4.76 MIL/uL (ref 4.22–5.81)
RDW: 18.7 % — ABNORMAL HIGH (ref 11.5–15.5)
WBC: 5.5 10*3/uL (ref 4.0–10.5)
nRBC: 1.4 % — ABNORMAL HIGH (ref 0.0–0.2)

## 2021-03-19 MED ORDER — LORATADINE 10 MG PO TABS
10.0000 mg | ORAL_TABLET | Freq: Every day | ORAL | Status: DC
  Administered 2021-03-19 – 2021-03-21 (×3): 10 mg via ORAL
  Filled 2021-03-19 (×3): qty 1

## 2021-03-19 MED ORDER — ALBUTEROL SULFATE (2.5 MG/3ML) 0.083% IN NEBU
2.5000 mg | INHALATION_SOLUTION | Freq: Four times a day (QID) | RESPIRATORY_TRACT | Status: DC
Start: 2021-03-19 — End: 2021-03-20
  Administered 2021-03-19 – 2021-03-20 (×4): 2.5 mg via RESPIRATORY_TRACT
  Filled 2021-03-19 (×4): qty 3

## 2021-03-19 MED ORDER — METHYLPREDNISOLONE SODIUM SUCC 125 MG IJ SOLR: 60.0000 mg | Freq: Two times a day (BID) | INTRAMUSCULAR | Status: DC

## 2021-03-19 MED ORDER — FLUTICASONE PROPIONATE 50 MCG/ACT NA SUSP
2.0000 | Freq: Every day | NASAL | Status: DC
Start: 2021-03-19 — End: 2021-03-21
  Filled 2021-03-19: qty 16

## 2021-03-19 MED ORDER — METHYLPREDNISOLONE SODIUM SUCC 125 MG IJ SOLR
120.0000 mg | INTRAMUSCULAR | Status: DC
  Administered 2021-03-19: 10:00:00 120 mg via INTRAVENOUS
  Filled 2021-03-19: qty 2

## 2021-03-19 MED ORDER — BUDESONIDE 0.25 MG/2ML IN SUSP
0.5000 mg | Freq: Two times a day (BID) | RESPIRATORY_TRACT | Status: DC
  Administered 2021-03-19 – 2021-03-21 (×4): 0.5 mg via RESPIRATORY_TRACT
  Filled 2021-03-19 (×4): qty 4

## 2021-03-19 NOTE — Plan of Care (Signed)
  Problem: Education: Goal: Knowledge of General Education information will improve Description: Including pain rating scale, medication(s)/side effects and non-pharmacologic comfort measures Outcome: Progressing   Problem: Health Behavior/Discharge Planning: Goal: Ability to manage health-related needs will improve Outcome: Progressing   Problem: Clinical Measurements: Goal: Ability to maintain clinical measurements within normal limits will improve Outcome: Progressing Goal: Will remain free from infection Outcome: Progressing Goal: Diagnostic test results will improve Outcome: Progressing Goal: Respiratory complications will improve Outcome: Progressing Goal: Cardiovascular complication will be avoided Outcome: Progressing   Problem: Activity: Goal: Risk for activity intolerance will decrease Outcome: Progressing   Problem: Nutrition: Goal: Adequate nutrition will be maintained Outcome: Progressing   Problem: Coping: Goal: Level of anxiety will decrease Outcome: Progressing   Problem: Elimination: Goal: Will not experience complications related to bowel motility Outcome: Progressing Goal: Will not experience complications related to urinary retention Outcome: Progressing   Problem: Pain Managment: Goal: General experience of comfort will improve Outcome: Progressing   Problem: Safety: Goal: Ability to remain free from injury will improve Outcome: Progressing   Problem: Skin Integrity: Goal: Risk for impaired skin integrity will decrease Outcome: Progressing   Problem: Education: Goal: Knowledge of disease or condition will improve Outcome: Progressing Goal: Knowledge of the prescribed therapeutic regimen will improve Outcome: Progressing Goal: Individualized Educational Video(s) Outcome: Progressing   Problem: Activity: Goal: Ability to tolerate increased activity will improve Outcome: Progressing Goal: Will verbalize the importance of balancing  activity with adequate rest periods Outcome: Progressing   Problem: Respiratory: Goal: Ability to maintain a clear airway will improve Outcome: Progressing Goal: Levels of oxygenation will improve Outcome: Progressing Goal: Ability to maintain adequate ventilation will improve Outcome: Progressing   

## 2021-03-19 NOTE — Plan of Care (Signed)
  Problem: Education: Goal: Knowledge of General Education information will improve Description: Including pain rating scale, medication(s)/side effects and non-pharmacologic comfort measures Outcome: Progressing   Problem: Health Behavior/Discharge Planning: Goal: Ability to manage health-related needs will improve Outcome: Progressing   Problem: Clinical Measurements: Goal: Ability to maintain clinical measurements within normal limits will improve Outcome: Progressing Goal: Will remain free from infection Outcome: Progressing Goal: Diagnostic test results will improve Outcome: Progressing Goal: Respiratory complications will improve Outcome: Progressing   Problem: Activity: Goal: Risk for activity intolerance will decrease Outcome: Progressing   

## 2021-03-19 NOTE — Progress Notes (Signed)
PROGRESS NOTE    Gary [name]  TKZ:[id number] DOB: Oct 03, 1955 DOA: 03/10/2021 PCP: [facility]    No chief complaint on file.   Brief Narrative:  65 year old man with past medical history of chronic hypoxic hypercapnic respiratory failure, acute on chronic diastolic congestive heart failure, morbid obesity, COPD, sleep apnea.  Patient initially presented on 02/18/2021 with acute on chronic hypoxic hypercapnic respiratory failure secondary to COVID-19 pneumonia.  Patient was transferred over to [facility] for consideration for ECMO but did not require that. Was gradually improving and transferred back to [facility]  Now with profound generalized weakness.   Today on 3-4 L/min of oxygen.   PT recommending rehab.     Assessment & Plan:   Principal Problem:   Acute respiratory failure with hypoxia and hypercapnia ([name]) Active Problems:   Morbid obesity with BMI of 50.0-59.9, adult ([name])   HTN (hypertension)   Acute on chronic diastolic (congestive) heart failure ([name])   Obstructive sleep apnea   Thrombocytopenia ([name])   COPD (chronic obstructive pulmonary disease) ([name])   Acute respiratory distress syndrome (ARDS) due to COVID-19 virus ([name])   (HFpEF) heart failure with preserved ejection fraction ([name])   Chronic respiratory failure with hypoxia ([name])   Pneumonia   Weakness  #1 acute recurrent hypercarbic respiratory failure/severe COPD exacerbation. -Patient noted to have left VBG with a PCO2 of 111, which increased to 116 despite increased BiPAP settings to 18/10. -12/2 patient transferred to the ICU as he was unresponsive with PCO2 going from 111-116 despite BiPAP. -12/3 clinical improvement with PCO2 improving to 68. -PCCM consulted and patient initially transferred to the ICU. -Patient seen by PCP who recommended IV Solu-Medrol, BiPAP support, bronchodilator therapy. -We will order IV Solu-Medrol, Flonase, Claritin, Pulmicort nebs. -Continue scheduled  albuterol nebulizers,yupelri, PPI -Continue BiPAP nightly. -Patient noted to have trilogy at home.  TOC. -PCCM following and appreciate input and recommendations.  2.  Acute metabolic encephalopathy -Likely secondary to acute severe COPD exacerbation secondary to hypercapnia. -Improved clinically. -Continue BiPAP. -Continue treatment for COPD exacerbation, avoid oversedation. -Was placed on Precedex drip to assist with agitation which has since been discontinued. -Supportive care.  3.  ARDS secondary to severe COVID-19 infection -Currently out of isolation since diagnosis was 02/18/2021. -Continue daily diuretics with torsemide. -Continue treatment as seen #1.  4.  Acute on chronic combined systolic and diastolic heart failure -Continue torsemide, bisoprolol. -Patient given acetazolamide for diuresis.  5.  OSA -BiPAP nightly.  6.  Dry skin feet bilaterally -Continue current Lac-Hydrin lotion.  7.  Generalized weakness/debility -Seen by PT OT who are recommending rehab. -Per TOC no bed offers at this point and patient will need to give overmedicate checks to pay for rehab. -Placement pending versus home with [facility], concern for not enough caregiver support available.  8.  Iron deficiency -Continue oral daily supplementation.  9.  Morbid obesity  10.  Thrombocytopenia -Patient with no overt bleeding -Discontinue Lovenox. -Follow-up.  11.  AKI -Resolved.   DVT prophylaxis: SCDs Code Status: Full Family Communication: Updated patient.  No family at bedside. Disposition:   Status is: Inpatient  Remains inpatient appropriate because: Severity of illness       Consultants:  PCCM: Dr. Mortimer Fries 03/17/2021  Procedures:  Chest x-ray 03/17/2021  Antimicrobials:  IV Rocephin 03/11/2021>>>> 03/12/2021   Subjective: Sleeping but arousable.  No chest pain.  Feels breathing has improved.  Alert and oriented to self place and time.  Currently off Precedex  drip  Objective: Vitals:   03/19/21 0809 03/19/21 0843 03/19/21 1145 03/19/21 1401  BP: 112/73  119/74   Pulse: 73 71 73 76  Resp:  18 19 16   Temp: 98.1 F (36.7 C)  98.1 F (36.7 C)   TempSrc:      SpO2: 94% 93% 93% 91%  Weight:      Height:        Intake/Output Summary (Last 24 hours) at 03/19/2021 1405 Last data filed at 03/19/2021 1000 Gross per 24 hour  Intake 120 ml  Output 950 ml  Net -830 ml   Filed Weights   03/16/21 0500 03/17/21 0500 03/19/21 0500  Weight: (!) 187 kg (!) 187 kg (!) 181.2 kg    Examination:  General exam: Appears calm and comfortable  Respiratory system: Minimal expiratory wheezing in the bases.  Poor to fair air movement.  Normal respiratory effort. Cardiovascular system: S1 & S2 heard, RRR. No JVD, murmurs, rubs, gallops or clicks. No pedal edema. Gastrointestinal system: Abdomen is nondistended, soft and nontender. No organomegaly or masses felt. Normal bowel sounds heard. Central nervous system: Alert and oriented. No focal neurological deficits. Extremities: Symmetric 5 x 5 power. Skin: No rashes, lesions or ulcers Psychiatry: Judgement and insight appear normal. Mood & affect appropriate.     Data Reviewed: I have personally reviewed following labs and imaging studies  CBC: Recent Labs  Lab 03/13/21 0648 03/16/21 0429 03/17/21 0555 03/18/21 0419 03/19/21 0454  WBC 7.3 6.3 5.7 5.9 5.5  NEUTROABS 5.1  --   --   --   --   HGB 12.1* 11.3* 11.9* 11.6* 11.3*  HCT 45.3 42.2 45.3 42.8 40.7  MCV 86.3 89.6 91.5 88.1 85.5  PLT 210 178 PLATELET CLUMPS NOTED ON SMEAR, COUNT APPEARS DECREASED 130* 119*    Basic Metabolic Panel: Recent Labs  Lab 03/13/21 0648 03/17/21 0555 03/18/21 0419 03/19/21 0454  NA 141 141 137 137  K 3.7 4.8 4.3 4.0  CL 96* 98 98 98  CO2 38* 40* 34* 32  GLUCOSE 114* 114* 87 89  BUN 32* 29* 27* 26*  CREATININE 1.03 1.14 0.94 1.08  CALCIUM 8.4* 8.3* 8.4* 8.4*  MG 2.2  --  2.2  --   PHOS  --   --  2.2*  3.1    GFR: Estimated Creatinine Clearance: 122 mL/min (by C-G formula based on SCr of 1.08 mg/dL).  Liver Function Tests: No results for input(s): AST, ALT, ALKPHOS, BILITOT, PROT, ALBUMIN in the last 168 hours.  CBG: Recent Labs  Lab 03/17/21 0949  GLUCAP 98     No results found for this or any previous visit (from the past 240 hour(s)).       Radiology Studies: No results found.      Scheduled Meds:  albuterol  2.5 mg Nebulization Q6H   ammonium lactate   Topical BID   bisoprolol  2.5 mg Oral QHS   budesonide  0.5 mg Nebulization BID   ferrous sulfate  325 mg Oral BID WC   fluticasone  2 spray Each Nare Daily   loratadine  10 mg Oral Daily   mouth rinse  15 mL Mouth Rinse BID   methylPREDNISolone (SOLU-MEDROL) injection  120 mg Intravenous Q24H   nystatin  5 mL Oral QID   pantoprazole  40 mg Oral Daily   revefenacin  175 mcg Nebulization Daily   simvastatin  10 mg Oral q1800   torsemide  40 mg Oral Daily   Continuous Infusions:  LOS: 9 days    Time spent: 40 minutes    Irine Seal, MD [facility]   To contact the attending provider between 7A-7P or the covering provider during after hours 7P-7A, please log into the web site [email or web address] and access using universal [place] password for that web site. If you do not have the password, please call the hospital operator.  03/19/2021, 2:05 PM

## 2021-03-19 NOTE — Progress Notes (Signed)
PHARMACIST - PHYSICIAN COMMUNICATION   CONCERNING: Methylprednisolone IV    Current order: Methylprednisolone IV 60mg  IV every 12 hours      DESCRIPTION: Per protocol [place] Protocol:   IV methylprednisolone will be converted to either a q12h or q24h frequency with the same total daily dose (TDD).  Ordered Dose: 1 to 125 mg TDD; convert to: TDD q24h.  Ordered Dose: 126 to 250 mg TDD; convert to: TDD div q12h.  Ordered Dose: >250 mg TDD; DAW.  Order has been adjusted to: Methylprednisolone IV 120mg  every 24 hours    Pernell Dupre , PharmD, BCPS Clinical Pharmacist  03/19/2021 8:33 AM

## 2021-03-19 NOTE — Progress Notes (Signed)
Nursing had conversation with patient and spouse regarding increased mobility. Patient was incontinent of stool and nursing suggested patient stand at bedside with assistance while nursing changes linen and assist with hygiene. Patient was very resistant and argumentative with nursing staff and his spouse. Patient's spouse was very upset that he was not even interested in trying and eventually she left after arguing with the patient. After some firm coaching Gary [name] was able to sit to the side of his bed and eventually stand with a walker and 2 assist while his bed was changed. He tolerated the activity well and was appreciative after the fact. Nursing will continue to push mobility.

## 2021-03-20 LAB — BLOOD GAS, ARTERIAL
Acid-Base Excess: 14.7 mmol/L — ABNORMAL HIGH (ref 0.0–2.0)
Bicarbonate: 44.8 mmol/L — ABNORMAL HIGH (ref 20.0–28.0)
FIO2: 100
MECHVT: 500 mL
O2 Saturation: 91.1 %
PEEP: 12 cmH2O
Patient temperature: 37
RATE: 28 resp/min
pCO2 arterial: 89 mmHg (ref 32.0–48.0)
pH, Arterial: 7.31 — ABNORMAL LOW (ref 7.350–7.450)
pO2, Arterial: 67 mmHg — ABNORMAL LOW (ref 83.0–108.0)

## 2021-03-20 LAB — BASIC METABOLIC PANEL
Anion gap: 5 (ref 5–15)
BUN: 34 mg/dL — ABNORMAL HIGH (ref 8–23)
CO2: 34 mmol/L — ABNORMAL HIGH (ref 22–32)
Calcium: 8.9 mg/dL (ref 8.9–10.3)
Chloride: 101 mmol/L (ref 98–111)
Creatinine, Ser: 1.2 mg/dL (ref 0.61–1.24)
GFR, Estimated: >60 mL/min (ref >60)
Glucose, Bld: 101 mg/dL — ABNORMAL HIGH (ref 70–99)
Potassium: 4.7 mmol/L (ref 3.5–5.1)
Sodium: 140 mmol/L (ref 135–145)

## 2021-03-20 LAB — CBC
HCT: 46.7 % (ref 39.0–52.0)
Hemoglobin: 12.7 g/dL — ABNORMAL LOW (ref 13.0–17.0)
MCH: 23.6 pg — ABNORMAL LOW (ref 26.0–34.0)
MCHC: 27.2 g/dL — ABNORMAL LOW (ref 30.0–36.0)
MCV: 87 fL (ref 80.0–100.0)
Platelets: 124 10*3/uL — ABNORMAL LOW (ref 150–400)
RBC: 5.37 MIL/uL (ref 4.22–5.81)
RDW: 19.6 % — ABNORMAL HIGH (ref 11.5–15.5)
WBC: 7.3 10*3/uL (ref 4.0–10.5)
nRBC: 2.6 % — ABNORMAL HIGH (ref 0.0–0.2)

## 2021-03-20 LAB — MAGNESIUM: Magnesium: 2.4 mg/dL (ref 1.7–2.4)

## 2021-03-20 MED ORDER — METHYLPREDNISOLONE SODIUM SUCC 125 MG IJ SOLR
60.0000 mg | INTRAMUSCULAR | Status: DC
  Administered 2021-03-20 – 2021-03-21 (×2): 60 mg via INTRAVENOUS
  Filled 2021-03-20 (×2): qty 2

## 2021-03-20 NOTE — Progress Notes (Signed)
OT Cancellation Note  Patient Details Name: Gary [name] MRN: [id number] DOB: 09-14-1955   Cancelled Treatment:    Reason Eval/Treat Not Completed: Other (comment). OT order received and chart reviewed. OT has made two attempts this morning to see pt for evaluation and pt asked therapist to return at a later time each time attempted. OT will re-attempt as time allows.   Darleen Crocker, MS, OTR/L , CBIS ascom [phone]  03/20/21, 10:34 AM

## 2021-03-20 NOTE — Evaluation (Signed)
Occupational Therapy Evaluation Patient Details Name: Gary [name] MRN: [id number] DOB: [birth year] Today's Date: 03/20/2021   History of Present Illness Gary [name] is a 65 y.o. male with medical history significant for COPD on home O2, morbid obesity, HFpEF, grade 3 DD, history of GI bleed OSA on nighttime BiPAP, HTN, transferred back from [place] on 03/11/21 for continued medical management ongoing medical problems patient was transferred to [facility] on 11/8 for ARDS secondary to severe COVID-19.  He was successfully extubated on 11/13 and has been gradually improving. Pt with decline in mental & respiratory status & was transferred to ICU on 03/17/21 & placed on bipap. Pt returned to floor on 03/18/21 & now with new PT orders.   Clinical Impression   Patient presenting with decreased Ind in self care, balance, functional mobility/transfers, endurance, and safety awareness. 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 He lives with his wife at home with assists with IADL tasks.  Patient currently needing maximal encouragement from wife and staff present in room. Pt needing mod of 2 to stand from elevated bed surface. Pt standing for ~ 5 minutes and needing total A for hygiene after BM.  Pt able to take several side steps to the R along edge of bed with min A of 2 for safety and use of RW. Patient will benefit from acute OT to increase overall independence in the areas of ADLs, functional mobility, and safety awareness in order to safely discharge to next venue of care.      Recommendations for follow up therapy are one component of a multi-disciplinary discharge planning process, led by the attending physician.  Recommendations may be updated based on patient status, additional functional criteria and insurance authorization.   Follow Up Recommendations  Skilled nursing-short term rehab (<3 hours/day)    Assistance Recommended at Discharge Frequent or constant  Supervision/Assistance  Functional Status Assessment  Patient has had a recent decline in their functional status and demonstrates the ability to make significant improvements in function in a reasonable and predictable amount of time.  Equipment Recommendations  Other (comment) (defer to next venue of care)       Precautions / Restrictions Precautions Precautions: Fall Restrictions Weight Bearing Restrictions: No      Mobility Bed Mobility   Bed Mobility: Rolling;Sidelying to Sit Rolling: Supervision Sidelying to sit: Supervision;HOB elevated       General bed mobility comments: seated on EOB when therapist entered the room    Transfers Overall transfer level: Needs assistance Equipment used: Rolling walker (2 wheels) Transfers: Sit to/from Stand Sit to Stand: Mod assist;+2 physical assistance Stand pivot transfers: Min assist;+2 physical assistance;From elevated surface         General transfer comment: Sit<>stand from significantly elevated EOB.      Balance Overall balance assessment: Needs assistance Sitting-balance support: Feet supported Sitting balance-[name] Scale: Good Sitting balance - Comments: steady sitting at edge of bed   Standing balance support: During functional activity;Bilateral upper extremity supported;Reliant on assistive device for balance Standing balance-[name] Scale: Poor Standing balance comment: BUE support on RW, unable to stand for prolonged time as pt will then lean on RW                           ADL either performed or assessed with clinical judgement   ADL Overall ADL's : Needs assistance/impaired  General ADL Comments: Pt standing with mod A of 2 and needing total A for hygiene secondary to pt using bed pan prior to therapist arrival. Pt was able to stand for ~ 5 minutes during hygiene.     Vision Patient Visual Report: No change from baseline               Pertinent Vitals/Pain Pain Assessment: No/denies pain     Hand Dominance Right   Extremity/Trunk Assessment Upper Extremity Assessment Upper Extremity Assessment: Overall WFL for tasks assessed   Lower Extremity Assessment Lower Extremity Assessment: Generalized weakness   Cervical / Trunk Assessment Cervical / Trunk Assessment: Normal   Communication Communication Communication: No difficulties   Cognition Arousal/Alertness: Awake/alert Behavior During Therapy: WFL for tasks assessed/performed Overall Cognitive Status: Within Functional Limits for tasks assessed                                 General Comments: Very resistant to education, requires encouragement for participation.     General Comments  Pt with continent BM on bed pan but pt requires total assist for peri hygiene. Educated pt on need to use BSC vs bed pan.            Home Living Family/patient expects to be discharged to:: Private residence Living Arrangements: Spouse/significant other Available Help at Discharge: Family Type of Home: Apartment Home Access: Stairs to enter Technical brewer of Steps: 1 Entrance Stairs-Rails: None Home Layout: One level     Bathroom Shower/Tub: Teacher, early years/pre: Handicapped height     Home Equipment: Conservation officer, nature (2 wheels);Cane - single point;BSC/3in1   Additional Comments: Oxygen Cpap, Portable oxygen. He used to wear 2L/min O2 at night.      Prior Functioning/Environment Prior Level of Function : Working/employed;Driving;Independent/Modified Independent             Mobility Comments: Pateint reports he was getting around well and going to work 4 days a week prior to hospitalization. ADLs Comments: Was fully I prior to hospitalization and working 4 days a week. 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 Does endorse that spouse had to assist with IADLs in the home and LB dressing        OT Problem List:  Decreased strength;Decreased activity tolerance;Obesity;Cardiopulmonary status limiting activity      OT Treatment/Interventions: Self-care/ADL training;Therapeutic exercise;DME and/or AE instruction;Therapeutic activities    OT Goals(Current goals can be found in the care plan section) Acute Rehab OT Goals Patient Stated Goal: to go home OT Goal Formulation: With patient Time For Goal Achievement: 04/03/21 Potential to Achieve Goals: Good ADL Goals Pt Will Perform Grooming: standing;with min guard assist Pt Will Perform Lower Body Dressing: with min guard assist;sit to/from stand Pt Will Transfer to Toilet: with min guard assist;ambulating Pt Will Perform Toileting - Clothing Manipulation and hygiene: with min guard assist;sit to/from stand  OT Frequency: Min 2X/week           Co-evaluation   Reason for Co-Treatment: For patient/therapist safety;To address functional/ADL transfers;Necessary to address cognition/behavior during functional activity PT goals addressed during session: Mobility/safety with mobility;Proper use of DME;Balance        AM-PAC OT "6 Clicks" Daily Activity     Outcome Measure Help from another person eating meals?: None Help from another person taking care of personal grooming?: A Little Help from another person toileting, which includes using toliet, bedpan, or  urinal?: Total Help from another person bathing (including washing, rinsing, drying)?: A Lot Help from another person to put on and taking off regular upper body clothing?: A Little Help from another person to put on and taking off regular lower body clothing?: Total 6 Click Score: 14   End of Session Equipment Utilized During Treatment: Gait belt;Rolling walker (2 wheels);Other (comment) Nurse Communication: Mobility status  Activity Tolerance: Patient tolerated treatment well Patient left: in bed;with call bell/phone within reach;with bed alarm set  OT Visit Diagnosis: Unsteadiness on feet  (R26.81);Muscle weakness (generalized) (M62.81)                Time: 4461-9012 OT Time Calculation (min): 12 min Charges:  OT General Charges $OT Visit: 1 Visit OT Evaluation $OT Eval Moderate Complexity: 1 [address], MS, OTR/L , CBIS ascom [phone]  03/20/21, 1:03 PM

## 2021-03-20 NOTE — Progress Notes (Signed)
Physical Therapy Discharge Patient Details Name: Gary [name] MRN: [id number] DOB: 12/25/1955 Today's Date: 03/20/2021 Time:  -     Patient discharged from PT services secondary to transfer to ICU 12/2.  He is back on floor and MD was messaged to request new orders if appropriate.  GP     Chesley Noon 03/20/2021, 8:37 AM

## 2021-03-20 NOTE — Evaluation (Addendum)
Physical Therapy Re-Evaluation Patient Details Name: Gary [name] MRN: [id number] DOB: 1956/02/08 Today's Date: 03/20/2021  History of Present Illness  Gary [name] is a 65 y.o. male with medical history significant for COPD on home O2, morbid obesity, HFpEF, grade 3 DD, history of GI bleed OSA on nighttime BiPAP, HTN, transferred back from [place] on 03/11/21 for continued medical management ongoing medical problems patient was transferred to [facility] on 11/8 for ARDS secondary to severe COVID-19.  He was successfully extubated on 11/13 and has been gradually improving. Pt with decline in mental & respiratory status & was transferred to ICU on 03/17/21 & placed on bipap. Pt returned to floor on 03/18/21 & now with new PT orders.  Clinical Impression  Pt seen for PT re-evaluation following new PT orders. Pt received on bedpan requiring assistance for peri hygiene. Pt's wife & nurse present during session providing encouragement/education throughout. Pt is ultimately able to perform bed mobility with supervision but with heavy reliance on hospital bed features (HOB fully elevated, use of bed rails, extra time) & is able to transfer to standing from significantly elevated EOB with mod assist +2. Pt progresses to taking side steps to R at EOB with mod assist +2 with cuing for RW management & decreased foot clearance before needing to sit. Pt educated on need to use BSC vs bedpan, continued participation in therapy while in acute setting, and recommendation of STR upon d/c. Pt's wife eager for pt to d/c to STR but pt with fluctuating willingness at this time. Will continue to follow pt acutely to progress gait & independence with transfers. All current PT goals remain appropriate at this time.     Recommendations for follow up therapy are one component of a multi-disciplinary discharge planning process, led by the attending physician.  Recommendations may be updated based on patient status, additional functional  criteria and insurance authorization.  Follow Up Recommendations Skilled nursing-short term rehab (<3 hours/day)    Assistance Recommended at Discharge Intermittent Supervision/Assistance  Functional Status Assessment Patient has had a recent decline in their functional status and demonstrates the ability to make significant improvements in function in a reasonable and predictable amount of time.  Equipment Recommendations  Other (comment);Hospital bed;Wheelchair (measurements PT);BSC/3in1 (bariatric hoyer lift, bariatric BSC)    Recommendations for Other Services       Precautions / Restrictions Precautions Precautions: Fall Restrictions Weight Bearing Restrictions: No      Mobility  Bed Mobility   Bed Mobility: Rolling;Sidelying to Sit Rolling: Supervision Sidelying to sit: Supervision;HOB elevated       General bed mobility comments: Pt requires encouragement to attempt mobility, strongly relies on HOB fully elevated, use of bed rails, & extra time needed.    Transfers Overall transfer level: Needs assistance Equipment used: Rolling walker (2 wheels) Transfers: Sit to/from Stand Sit to Stand: Mod assist;+2 physical assistance           General transfer comment: Sit<>stand from significantly elevated EOB.    Ambulation/Gait                  Stairs            Wheelchair Mobility    Modified Rankin (Stroke Patients Only)       Balance Overall balance assessment: Needs assistance Sitting-balance support: Feet supported Sitting balance-[name] Scale: Good Sitting balance - Comments: steady sitting at edge of bed   Standing balance support: During functional activity;Bilateral upper extremity supported;Reliant on assistive device for balance  Standing balance-[name] Scale: Poor Standing balance comment: BUE support on RW, unable to stand for prolonged time as pt will then lean on RW                             Pertinent Vitals/Pain  Pain Assessment: No/denies pain    Home Living Family/patient expects to be discharged to:: Private residence Living Arrangements: Spouse/significant other Available Help at Discharge: Family Type of Home: Apartment Home Access: Stairs to enter Entrance Stairs-Rails: None Entrance Stairs-Number of Steps: 1   Home Layout: One level Home Equipment: Conservation officer, nature (2 wheels);Cane - single point;BSC/3in1 Additional Comments: Oxygen Cpap, Portable oxygen. He used to wear 2L/min O2 at night.    Prior Function Prior Level of Function : Working/employed;Driving;Independent/Modified Independent             Mobility Comments: Pateint reports he was getting around well and going to work 4 days a week prior to hospitalization. ADLs Comments: Was fully I prior to hospitalization and working 4 days a week. 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 Does endorse that spouse had to assist with IADLs in the home and LB dressing     Hand Dominance        Extremity/Trunk Assessment   Upper Extremity Assessment Upper Extremity Assessment: Overall WFL for tasks assessed    Lower Extremity Assessment Lower Extremity Assessment: Generalized weakness       Communication   Communication: No difficulties  Cognition Arousal/Alertness: Awake/alert Behavior During Therapy: WFL for tasks assessed/performed Overall Cognitive Status: Within Functional Limits for tasks assessed                                 General Comments: Very resistant to education, requires encouragement for participation.        General Comments General comments (skin integrity, edema, etc.): Pt with continent BM on bed pan but pt requires total assist for peri hygiene. Educated pt on need to use BSC vs bed pan.    Exercises     Assessment/Plan    PT Assessment Patient needs continued PT services  PT Problem List Decreased strength;Cardiopulmonary status limiting activity;Pain;Decreased range of  motion;Decreased activity tolerance;Decreased knowledge of use of DME;Decreased balance;Obesity;Decreased mobility;Decreased safety awareness       PT Treatment Interventions DME instruction;Balance training;Gait training;Stair training;Functional mobility training;Therapeutic activities;Therapeutic exercise;Patient/family education;Neuromuscular re-education    PT Goals (Current goals can be found in the Care Plan section)  Acute Rehab PT Goals Patient Stated Goal: to go home PT Goal Formulation: With patient Time For Goal Achievement: 04/03/21 Potential to Achieve Goals: Fair    Frequency Min 2X/week   Barriers to discharge Decreased caregiver support      Co-evaluation PT/OT/SLP Co-Evaluation/Treatment: Yes Reason for Co-Treatment: For patient/therapist safety;To address functional/ADL transfers;Necessary to address cognition/behavior during functional activity PT goals addressed during session: Mobility/safety with mobility;Proper use of DME;Balance         AM-PAC PT "6 Clicks" Mobility  Outcome Measure Help needed turning from your back to your side while in a flat bed without using bedrails?: A Little Help needed moving from lying on your back to sitting on the side of a flat bed without using bedrails?: A Lot Help needed moving to and from a bed to a chair (including a wheelchair)?: A Lot Help needed standing up from a chair using your arms (e.g., wheelchair or  bedside chair)?: Total Help needed to walk in hospital room?: Total Help needed climbing 3-5 steps with a railing? : Total 6 Click Score: 10    End of Session Equipment Utilized During Treatment: Oxygen Activity Tolerance: Patient tolerated treatment well (pt self limiting) Patient left: with family/visitor present;with call bell/phone within reach;with bed alarm set (sitting EOB) Nurse Communication: Mobility status PT Visit Diagnosis: Other abnormalities of gait and mobility (R26.89);Muscle weakness  (generalized) (M62.81);Difficulty in walking, not elsewhere classified (R26.2);Unsteadiness on feet (R26.81)    Time: 7680-8811 PT Time Calculation (min) (ACUTE ONLY): 27 min   Charges:   PT Evaluation $PT Re-evaluation: 1 Re-eval          Lavone Nian, PT, DPT 03/20/21, 12:23 PM   [name] 03/20/2021, 11:26 AM

## 2021-03-20 NOTE — Progress Notes (Signed)
PROGRESS NOTE    Gary [name]   MHD:[id number]  DOB: May 08, 1955  PCP: [facility] [place]    DOA: 03/10/2021 LOS: 35    Brief Narrative / Hospital Course to Date:   65 year old man with past medical history of chronic hypoxic hypercapnic respiratory failure, acute on chronic diastolic congestive heart failure, morbid obesity, COPD, sleep apnea.  Patient initially presented on 02/18/2021 with acute on chronic hypoxic hypercapnic respiratory failure secondary to COVID-19 pneumonia.  Patient was transferred over to [facility] for consideration for ECMO but did not require that. Was gradually improving and transferred back to [facility].  Now with profound generalized weakness.  Today on 3-4 L/min of oxygen.   PT recommending rehab.    Assessment & Plan   Principal Problem:   Acute respiratory failure with hypoxia and hypercapnia ([name]) Active Problems:   Morbid obesity with BMI of 50.0-59.9, adult ([name])   HTN (hypertension)   Acute on chronic diastolic (congestive) heart failure ([name])   Obstructive sleep apnea   Thrombocytopenia ([name])   COPD (chronic obstructive pulmonary disease) ([name])   Acute respiratory distress syndrome (ARDS) due to COVID-19 virus ([name])   (HFpEF) heart failure with preserved ejection fraction ([name])   Chronic respiratory failure with hypoxia ([name])   Pneumonia   Weakness   Recurrent hypercapnic respiratory failure -Improved. 12/1-2 VBG showed PCO2 of 111, further increased to 116 despite increased BiPAP settings to 18/10 overnight. --PCCM consulted and patient transferred to ICU --See PCCM recommendations, appreciate assistance   Acute metabolic encephalopathy - Resolved with bipap 12/1 pt lethargic, somnolent, difficult to arouse.   12/2 patient unresponsive on BiPAP 12/3 - resolved after more aggressive bipap settings in ICU --Treat underlying hypercapnic respiratory failure --BiPAP overnight and when asleep --Delirium  precautions --Monitor closely  Acute hypoxic, hypercapnic respiratory failure.  PCO2 was up as high as 105 during the hospital course.   12/1 - suspect recurrence, eval as above 12/2 - transferred to ICU unresponsive with pCO2 111>>116 despite bipap 12/3 - improved, normal mental status pCO improved to 68 --Requires BiPAP at night and when sleeping  --Has trilogy at home as per [facility] - verifying this.  --Wean o2 as tolerated, keep sats 88-94% --COPD tx as below  ARDS secondary to severe COVID-19 infection.   Patient is out of isolation since diagnosis was on 02/18/2021. Continue torsemide.  Acute on chronic diastolic congestive heart failure.  Continue torsemide 40 mg daily.  Added low-dose bisoprolol at night. Monitor volume status -I/O's and daily weights --Acetazolamide given 12/2 for diuresis  Obstructive sleep apnea on BiPAP at night  COPD with acute exacerbation.  Primary respiratory issue seems to be obesity hypoventilation/OSA and hypercapnia Continue bronchodilators. Resumed on steroids, reduce to Solu-med 60 mg daily.  Transition to prednisone and short taper at discharge.  Dry skin bilateral feet - Lac-Hydrin lotion  Generalized Weakness, Debility.  PT recommending rehab. Per TOC, no bed offers at this point.  Pt will need to sign over Medicaid checks to pay for rehab which he refuses. Will d/c home with DME and max home health services, if available.  Acute kidney injury.  Resolved.  Cr trend recently 0.94>>1.08>>1.20 On 02/22/2021 creatinine was 1.55.    Iron deficiency -continue daily supplement  Morbid Obesity: Body mass index is 49.93 kg/m.   DVT prophylaxis: Place and maintain sequential compression device Start: 03/19/21 0825 Place TED hose Start: 03/19/21 0825   Diet:  Diet Orders (From admission, onward)     Start  Ordered   03/11/21 0156  Diet Heart Room service appropriate? Yes; Fluid consistency: Thin  Diet effective now       Question Answer  Comment  Room service appropriate? Yes   Fluid consistency: Thin      03/11/21 0156              Code Status: Full Code   Subjective 03/20/21    Patient awake seated edge of bed, visiting with significant other.  He reports breathing improved.  Does not feel tight or wheezing.  He thinks he has CPAP at home, not Trilogy/Bipap.  He wants to go home, declines rehab.  Eager to go home soon.  No other acute complaints.    Disposition Plan & Communication   Status is: Inpatient  Remains inpatient appropriate because:  Requires DME and HH set up for safe d/c home.   Family Communication: wife at bedside on rounds today 12/5   Consults, Procedures, Significant Events   Consultants:  none  Procedures:  none  Antimicrobials:  Anti-infectives (From admission, onward)    Start     Dose/Rate Route Frequency Ordered Stop   03/11/21 1000  cefTRIAXone (ROCEPHIN) 2 g in sodium chloride 0.9 % 100 mL IVPB        2 g 200 mL/hr over 30 Minutes Intravenous Every 24 hours 03/11/21 0847 03/12/21 1703         Micro    Objective   Vitals:   03/20/21 0357 03/20/21 0700 03/20/21 0825 03/20/21 1100  BP: 119/71 112/75  121/78  Pulse: 77 76  80  Resp: 16 18  17   Temp: 98.7 F (37.1 C) 98.5 F (36.9 C)  98.3 F (36.8 C)  TempSrc: Oral Oral  Oral  SpO2: (!) 89% 92% 92% 91%  Weight:      Height:        Intake/Output Summary (Last 24 hours) at 03/20/2021 1621 Last data filed at 03/20/2021 1223 Gross per 24 hour  Intake 960 ml  Output 800 ml  Net 160 ml   Filed Weights   03/16/21 0500 03/17/21 0500 03/19/21 0500  Weight: (!) 187 kg (!) 187 kg (!) 181.2 kg    Physical Exam:  General exam: seated edge of bed, awake, alert, no acute distress, morbidly obese Respiratory system: lungs CTAB with improved aeration, no wheezes or rhonchi, on 2 L/min [place] O2 with normal respiratory effort Cardiovascular system: normal S1/S2,  RRR   Central nervous system: A&Ox3, normal speech,  grossly non-focal exam   Labs   Data Reviewed: I have personally reviewed following labs and imaging studies  CBC: Recent Labs  Lab 03/16/21 0429 03/17/21 0555 03/18/21 0419 03/19/21 0454 03/20/21 0627  WBC 6.3 5.7 5.9 5.5 7.3  HGB 11.3* 11.9* 11.6* 11.3* 12.7*  HCT 42.2 45.3 42.8 40.7 46.7  MCV 89.6 91.5 88.1 85.5 87.0  PLT 178 PLATELET CLUMPS NOTED ON SMEAR, COUNT APPEARS DECREASED 130* 119* 854*   Basic Metabolic Panel: Recent Labs  Lab 03/17/21 0555 03/18/21 0419 03/19/21 0454 03/20/21 0627  NA 141 137 137 140  K 4.8 4.3 4.0 4.7  CL 98 98 98 101  CO2 40* 34* 32 34*  GLUCOSE 114* 87 89 101*  BUN 29* 27* 26* 34*  CREATININE 1.14 0.94 1.08 1.20  CALCIUM 8.3* 8.4* 8.4* 8.9  MG  --  2.2  --  2.4  PHOS  --  2.2* 3.1  --    GFR: Estimated Creatinine Clearance: 109.8 mL/min (by C-G  formula based on SCr of 1.2 mg/dL). Liver Function Tests: No results for input(s): AST, ALT, ALKPHOS, BILITOT, PROT, ALBUMIN in the last 168 hours. No results for input(s): LIPASE, AMYLASE in the last 168 hours. No results for input(s): AMMONIA in the last 168 hours. Coagulation Profile: No results for input(s): INR, PROTIME in the last 168 hours. Cardiac Enzymes: No results for input(s): CKTOTAL, CKMB, CKMBINDEX, TROPONINI in the last 168 hours. BNP (last 3 results) No results for input(s): PROBNP in the last 8760 hours. HbA1C: No results for input(s): HGBA1C in the last 72 hours. CBG: Recent Labs  Lab 03/17/21 0949  GLUCAP 98   Lipid Profile: No results for input(s): CHOL, HDL, LDLCALC, TRIG, CHOLHDL, LDLDIRECT in the last 72 hours. Thyroid Function Tests: No results for input(s): TSH, T4TOTAL, FREET4, T3FREE, THYROIDAB in the last 72 hours. Anemia Panel: No results for input(s): VITAMINB12, FOLATE, FERRITIN, TIBC, IRON, RETICCTPCT in the last 72 hours. Sepsis Labs: No results for input(s): PROCALCITON, LATICACIDVEN in the last 168 hours.  No results found for this or any  previous visit (from the past 240 hour(s)).    Imaging Studies   No results found.   Medications   Scheduled Meds:  ammonium lactate   Topical BID   bisoprolol  2.5 mg Oral QHS   budesonide  0.5 mg Nebulization BID   ferrous sulfate  325 mg Oral BID WC   fluticasone  2 spray Each Nare Daily   loratadine  10 mg Oral Daily   mouth rinse  15 mL Mouth Rinse BID   methylPREDNISolone (SOLU-MEDROL) injection  60 mg Intravenous Q24H   nystatin  5 mL Oral QID   pantoprazole  40 mg Oral Daily   revefenacin  175 mcg Nebulization Daily   simvastatin  10 mg Oral q1800   torsemide  40 mg Oral Daily   Continuous Infusions:       LOS: 10 days    Time spent: 25 minutes with > 50% spent at bedside and in coordination of care     Ezekiel Slocumb, DO [facility]  03/20/2021, 4:21 PM      If 7PM-7AM, please contact night-coverage. How to contact the [facility] Attending or Consulting provider [place] or covering provider during after hours [place], for this patient?    Check the care team in [facility] and look for a) attending/consulting TRH provider listed and b) the [facility] team listed Log into [email or web address] and use [place]'s universal password to access. If you do not have the password, please contact the hospital operator. Locate the [facility] provider you are looking for under [facility] and page to a number that you can be directly reached. If you still have difficulty reaching the provider, please page the [facility] (Director on Call) for the Hospitalists listed on amion for assistance.

## 2021-03-20 NOTE — TOC Progression Note (Signed)
Transition of Care [facility]) - Progression Note    Patient Details  Name: Gary [name] MRN: [id number] Date of Birth: 1956/01/03  [facility] CM/SW Contact  Beverly Sessions, RN Phone Number: 03/20/2021, 2:17 PM  Clinical Narrative:     Met with patient and significant other at bedside.  Significant other does not feel like she can manage patient at home.  Patient states the home is in his name and he will be returning at discharge.  Patient mod assist +2 for transfers.  Patient is adamantly declining SNF placement  Patient states that he receives 80 hours of PCS per month from [facility].  Patient will require EMS transport home Patient request for DM to be delivered to home today in anticipation of DC tomorrow.  Bed, lift, bsc, and WC to be delivered to home.   Patient and significant other are both aware that I may not be able to secure home health services due to his payor.    I have reached out to [name], [place] and [facility] home health to see if they are able to accept a Medicaid patient [facility] and [facility] unable to accept   Expected Discharge Plan: [place] Barriers to Discharge: Continued Medical Work up  Expected Discharge Plan and Services Expected Discharge Plan: [place] arrangements for the past 2 months: Single Family Home                                       Social Determinants of Health (SDOH) Interventions    Readmission Risk Interventions Readmission Risk Prevention Plan 03/12/2021 03/01/2020 03/01/2020  Transportation Screening Complete Complete Complete  PCP or Specialist Appt within 3-5 Days Complete Not Complete Not Complete  Not Complete comments - To be scheduled by unit secretary will be scheduled by unit clerk  [place] or Home Care Consult Complete Complete Complete  Social Work Consult for [place] Planning/Counseling Complete Complete  Complete  Palliative Care Screening Not Applicable Not Applicable Not Applicable  Medication Review Press photographer) Complete Complete Complete  Some recent data might be hidden

## 2021-03-21 MED ORDER — SALINE SPRAY 0.65 % NA SOLN: 1.0000 | NASAL | 1 refills | Status: DC | PRN

## 2021-03-21 MED ORDER — ACETAMINOPHEN 325 MG PO TABS: 650.0000 mg | ORAL_TABLET | Freq: Four times a day (QID) | ORAL | Status: DC | PRN

## 2021-03-21 MED ORDER — FLUTICASONE PROPIONATE 50 MCG/ACT NA SUSP: 2.0000 | Freq: Every day | NASAL | 1 refills | Status: DC

## 2021-03-21 MED ORDER — SIMVASTATIN 10 MG PO TABS: 10.0000 mg | ORAL_TABLET | Freq: Every day | ORAL | 1 refills | Status: DC

## 2021-03-21 MED ORDER — POTASSIUM CHLORIDE CRYS ER 10 MEQ PO TBCR: 10.0000 meq | EXTENDED_RELEASE_TABLET | Freq: Every day | ORAL | 0 refills | Status: DC

## 2021-03-21 MED ORDER — BISOPROLOL FUMARATE 5 MG PO TABS: 2.5000 mg | ORAL_TABLET | Freq: Every day | ORAL | 1 refills | Status: DC

## 2021-03-21 MED ORDER — FERROUS SULFATE 325 (65 FE) MG PO TABS: 325.0000 mg | ORAL_TABLET | Freq: Two times a day (BID) | ORAL | 1 refills | Status: DC

## 2021-03-21 MED ORDER — PREDNISONE 10 MG PO TABS: ORAL_TABLET | ORAL | 0 refills | Status: AC

## 2021-03-21 MED ORDER — LORATADINE 10 MG PO TABS: 10.0000 mg | ORAL_TABLET | Freq: Every day | ORAL | Status: DC

## 2021-03-21 MED ORDER — TORSEMIDE 40 MG PO TABS: 40.0000 mg | ORAL_TABLET | Freq: Every day | ORAL | 1 refills | Status: DC

## 2021-03-21 MED ORDER — PANTOPRAZOLE SODIUM 40 MG PO TBEC: 40.0000 mg | DELAYED_RELEASE_TABLET | Freq: Every day | ORAL | 1 refills | Status: DC

## 2021-03-21 NOTE — Progress Notes (Signed)
Discharge instructions reviewed with patient. All questions answered at this time. IV removed. Transport information in discharge envelope at this time. Patient belongings packed by this RN and are at bedside to be transported home with patient. Wheelchair at bedside belongs to patient and will be transported home by patients wife.

## 2021-03-21 NOTE — TOC Transition Note (Addendum)
Transition of Care [facility]) - CM/SW Discharge Note   Patient Details  Name: Gary [name] MRN: [id number] Date of Birth: 1956/01/20  Transition of Care [facility]) CM/SW Contact:  Eileen Stanford, LCSW Phone Number: 03/21/2021, 1:45 PM   Clinical Narrative:   Per Suanne Marker with [facility], the office said the DME will be delivered today that the pt could go ahead and dc home. EMS scheduled. RN aware.  All HH agencies have declined at this time.   Final next level of care: Home/Self Care Barriers to Discharge: No Barriers Identified   Patient Goals and CMS Choice Patient states their goals for this hospitalization and ongoing recovery are:: SNF CMS Medicare.gov Compare Post Acute Care list provided to:: Patient Choice offered to / list presented to : Patient  Discharge Placement                Patient to be transferred to facility by: [facility]   Patient and family notified of of transfer: 03/21/21  Discharge Plan and Services                                     Social Determinants of Health (SDOH) Interventions     Readmission Risk Interventions Readmission Risk Prevention Plan 03/12/2021 03/01/2020 03/01/2020  Transportation Screening Complete Complete Complete  PCP or Specialist Appt within 3-5 Days Complete Not Complete Not Complete  Not Complete comments - To be scheduled by unit secretary will be scheduled by unit clerk  [place] or [place] Complete Complete Complete  Social Work Consult for [place] Planning/Counseling Complete Complete Complete  Palliative Care Screening Not Applicable Not Applicable Not Applicable  Medication Review Press photographer) Complete Complete Complete  Some recent data might be hidden

## 2021-03-21 NOTE — Discharge Summary (Signed)
Physician Discharge Summary   Patient name: Gary [name]  Admit date:     03/10/2021  Discharge date: 03/21/2021  Discharge Physician: Ezekiel Slocumb   PCP: [facility]   Recommendations at discharge:   Follow up with PCP closely. Follow up with Pulmonology.   Discharge Diagnoses Principal Problem:   Acute respiratory failure with hypoxia and hypercapnia ([name]) Active Problems:   Morbid obesity with BMI of 50.0-59.9, adult ([name])   HTN (hypertension)   Acute on chronic diastolic (congestive) heart failure ([name])   Obstructive sleep apnea   Thrombocytopenia ([name])   COPD (chronic obstructive pulmonary disease) ([name])   Acute respiratory distress syndrome (ARDS) due to COVID-19 virus ([name])   (HFpEF) heart failure with preserved ejection fraction ([name])   Chronic respiratory failure with hypoxia ([name])   Pneumonia   Weakness      Hospital Course   65 year old man with past medical history of chronic hypoxic hypercapnic respiratory failure, acute on chronic diastolic congestive heart failure, morbid obesity, COPD, sleep apnea.  Patient initially presented on 02/18/2021 with acute on chronic hypoxic hypercapnic respiratory failure secondary to COVID-19 pneumonia.  Patient was transferred over to [facility] for consideration for ECMO but did not require that. Was gradually improving and transferred back to [facility]  Now with profound generalized weakness.   At time of discharge, patient has been stable on 3-4 L/min of oxygen, using BiPAP at night and when sleeping.    PT recommending rehab, but this would require patient to sign over his medicaid checks, which he declines to do.  He is clinically stable for discharge home with caregiver / family support and DME obtained.   Due to patient's insurance, home health services were not able to be obtained.      Recurrent hypercapnic respiratory failure -Resolved. 12/1-2 VBG showed PCO2 of 111, further increased to  116 despite increased BiPAP settings to 18/10 overnight. --PCCM consulted and patient transferred to ICU --See PCCM recommendations, appreciate assistance  BiPAP when sleeping is required.    Acute metabolic encephalopathy - Resolved with bipap --Treat underlying hypercapnic respiratory failure --BiPAP overnight and when asleep --Delirium precautions --Monitor closely    Acute hypoxic, hypercapnic respiratory failure.  PCO2 was up as high as 105 during the hospital course.   12/1 - suspect recurrence, eval as above 12/2 - transferred to ICU unresponsive with pCO2 111>>116 despite bipap 12/3 - improved, normal mental status pCO improved to 68 --Requires BiPAP at night and when sleeping  --Has trilogy at home as per TOC  --Wean o2 as tolerated, keep sats 88-94% --COPD tx as below    ARDS secondary to severe COVID-19 infection.  Resolved. Patient is out of isolation since diagnosis was on 02/18/2021. Continue torsemide.   Acute on chronic diastolic congestive heart failure.   Continue torsemide 40 mg daily.   Added low-dose bisoprolol at night. Monitor volume status -I/O's and daily weights --Acetazolamide given 12/2 for diuresis   Obstructive sleep apnea on BiPAP at night   COPD with acute exacerbation.  Primary respiratory issue seems to be obesity hypoventilation/OSA and hypercapnia Continue bronchodilators. Resumed on steroids, short taper prednisone at discharge.   Dry skin bilateral feet - Lac-Hydrin lotion   Generalized Weakness, Debility.  PT recommending rehab. Pt declined due to requirement to sign over medicaid check to pay.  TOC unable to secure [facility] services as well, due to payor. DME was obtained and delivered, see TOC notes.  High risk for readmission, morbidity  and mortality.   Acute kidney injury.  Resolved.    Iron deficiency -continue daily supplement   Morbid Obesity: Body mass index is 49.93 kg/m.    Procedures performed: None   Condition at  discharge: stable  Exam General exam: awake, alert, no acute distress, morbidly obese Respiratory system: CTAB diminished due to body habitus, no wheezes, rales or rhonchi, normal respiratory effort. Cardiovascular system: normal S1/S2, RRR Central nervous system: A&O x4. no gross focal neurologic deficits, normal speech Skin: dry, intact, normal temperature Psychiatry: normal mood, congruent affect, judgement and insight appear normal   Disposition: Home  Discharge time: greater than 30 minutes.  Follow-up [place]. Schedule an appointment as soon as possible for a visit in 1 week(s).   Specialty: General Practice Why: Hospital follow up Contact information: [place] [address] [phone]         [name], MD .   Specialty: Cardiology Contact information: [place] [address] [phone]         [name], MD. Schedule an appointment as soon as possible for a visit in 2 week(s).   Specialty: Pulmonary Disease Why: COPD / [facility] follow up Contact information: [place] [address] [phone]                 Allergies as of 03/21/2021   No Known Allergies      Medication List     STOP taking these medications    ascorbic acid 500 MG tablet Commonly known as: VITAMIN C   baricitinib tablet Commonly known as: OLUMIANT   enoxaparin 100 MG/ML injection Commonly known as: LOVENOX   feeding supplement (PROSource TF) liquid   feeding supplement (VITAL HIGH PROTEIN) Liqd liquid   fentaNYL Soln Commonly known as: SUBLIMAZE   Flovent HFA 220 MCG/ACT inhaler Generic drug: fluticasone   multivitamin Liqd   pantoprazole 40 MG injection Commonly known as: PROTONIX Replaced by: pantoprazole 40 MG tablet   propofol 1000 MG/100ML Emul injection Commonly known as: DIPRIVAN   zinc sulfate 220 (50 Zn) MG capsule        TAKE these medications    acetaminophen 325 MG tablet Commonly known as: TYLENOL Take 2 tablets (650 mg total) by mouth every 6 (six) hours as needed for mild pain (or Fever >/= 101).   bisoprolol 5 MG tablet Commonly known as: ZEBETA Take 0.5 tablets (2.5 mg total) by mouth at bedtime.   budesonide 0.25 MG/2ML nebulizer solution Commonly known as: PULMICORT Take 2 mLs (0.25 mg total) by nebulization 2 (two) times daily.   ferrous sulfate 325 (65 FE) MG tablet Take 1 tablet (325 mg total) by mouth 2 (two) times daily with a meal.   fluticasone 50 MCG/ACT nasal spray Commonly known as: FLONASE Place 2 sprays into both nostrils daily. Start taking on: March 22, 2021   ipratropium-albuterol 0.5-2.5 (3) MG/3ML Soln Commonly known as: DUONEB Take 3 mLs by nebulization every 6 (six) hours.   loratadine 10 MG tablet Commonly known as: CLARITIN Take 1 tablet (10 mg total) by mouth daily. Start taking on: March 22, 2021   pantoprazole 40 MG tablet Commonly known as: PROTONIX Take 1 tablet (40 mg total) by mouth daily. Start taking on: March 22, 2021 Replaces: pantoprazole 40 MG injection   potassium chloride 10 MEQ tablet Commonly known as: KLOR-CON M Take 1 tablet (10 mEq total) by mouth  daily.   predniSONE 10 MG tablet Commonly known as: DELTASONE Take 5 tablets (50 mg total) by mouth daily for 1 day, THEN 4 tablets (40 mg total) daily for 1 day, THEN 3 tablets (30 mg total) daily for 1 day, THEN 2 tablets (20 mg total) daily for 1 day, THEN 1 tablet (10 mg total) daily for 1 day. Start taking on: March 21, 2021 What changed:  medication strength See the new instructions.   simvastatin 10 MG tablet Commonly known as: ZOCOR Take 1 tablet (10 mg total) by mouth daily at 6 PM.   sodium chloride 0.65 % Soln nasal spray Commonly known as: OCEAN Place 1 spray into both nostrils as needed for congestion.   Torsemide 40 MG Tabs Take 40 mg by mouth daily. Start  taking on: March 22, 2021               Durable Medical Equipment  (From admission, onward)           Start     Ordered   03/20/21 1302  For home use only DME 3 n 1  Once       Comments: Bariatric   03/20/21 1302   03/20/21 1301  For home use only DME high strength lightweight manual wheelchair with seat cushion  Once       Comments: Bariatric    Patient suffers from profound generalized weakness after Severe Covid-19 pneumonia with ARDS which impairs their ability to perform daily activities like bathing, dressing, grooming, and toileting in the home.  A walker will not resolve issue with performing activities of daily living. A wheelchair will allow patient to safely perform daily activities.Length of need 6 months . (THEN ONE OF THESE TWO:) Patient requires a size of bariatric/extra large which is not available in a standard or lightweight wheelchair and patient spends at least two hours per day in their chair. Accessories: elevating leg rests (ELRs), wheel locks, extensions and anti-tippers.   03/20/21 1302   03/20/21 1301  For home use only DME Hospital bed  Once       Comments: Bariatric  Question Answer Comment  Length of Need 6 Months   Patient has (list medical condition): Profound generalized weakness after severe [id number] pneumonia complicated by ARDS   The above medical condition requires: Patient requires the ability to reposition frequently   Bed type Heavy-duty, semi-electric (for patients >350 lbs.)   [employer] Yes   Trapeze Bar Yes   Support Surface: Gel Overlay      03/20/21 1302            DG Chest 1 View  Result Date: 02/21/2021 CLINICAL DATA:  Shortness of breath EXAM: CHEST  1 VIEW COMPARISON:  02/20/2021 FINDINGS: Endotracheal tube with tip at the clavicular heads. The enteric tube would not be visible below the lower chest due to body habitus. Cardiomegaly and vascular pedicle widening accentuated by rotation. Presumed atelectasis/pleural  fluid with vascular congestion IMPRESSION: 1. Unremarkable endotracheal tube. 2. Body habitus limits visualization of any tubing below the mid chest. 3. Unchanged aeration. Electronically Signed   By: Jorje Guild M.D.   On: 02/21/2021 04:55   DG Chest Port 1 View  Result Date: 03/17/2021 CLINICAL DATA:  Respiratory failure, unconscious, on BiPAP EXAM: PORTABLE CHEST 1 VIEW COMPARISON:  Portable exam 1031 hours compared to 02/22/2021 FINDINGS: Enlargement of cardiac silhouette with pulmonary vascular congestion. Subsegmental atelectasis LEFT mid lung. Additional patchy opacities at the lower lungs could represent  atelectasis or infiltrate. No pleural effusion or pneumothorax. Osseous structures unremarkable. IMPRESSION: Bibasilar opacities question atelectasis versus infiltrate. Subsegmental atelectasis LEFT mid lung. Enlargement of cardiac silhouette with pulmonary vascular congestion. Electronically Signed   By: Lavonia Dana M.D.   On: 03/17/2021 10:44   DG Chest Port 1 View  Result Date: 02/22/2021 CLINICAL DATA:  Acute respiratory failure with hypoxia. EXAM: PORTABLE CHEST 1 VIEW COMPARISON:  February 21, 2021. FINDINGS: Endotracheal tube tip is approximately 3.7 cm above the carina and projecting at the level of the clavicular heads. The enteric tube is not visible below the lower chest due to patient body habitus. Similar enlargement of the cardiac silhouette and vascular pedicle. Similar presumed atelectasis/pleural fluid at the lung bases with pulmonary vascular congestion. IMPRESSION: 1. Endotracheal tube tip projects at the level of the clavicular heads. 2. Body habitus limits visualization of any tubing below the mid chest. 3. Unchanged aeration, described above. Electronically Signed   By: Margaretha Sheffield M.D.   On: 02/22/2021 08:08   DG Chest Port 1 View  Result Date: 02/20/2021 CLINICAL DATA:  Follow-up CHF, intubated. EXAM: PORTABLE CHEST 1 VIEW COMPARISON:  Portable chest 02/18/2021 at  9:08 p.m. FINDINGS: There is moderate to severe cardiomegaly with perihilar vascular congestion, central and lower zonal edema, moderate increased pleural effusions and increased opacities in the lower lung fields consistent with atelectasis or consolidation. The upper lung fields remain clear. Endotracheal tube has its tip 6.5 cm from the carina. Enteric tube enters the stomach with the intragastric course not well seen and not much included. IMPRESSION: 1. Cardiomegaly with increased vascular congestion and edema. 2. Increased moderate pleural effusions and lower zone lung opacities. 3. Interval ETT pullback to 6.5 cm from the carina. Electronically Signed   By: Telford Nab M.D.   On: 02/20/2021 06:03   ECHOCARDIOGRAM COMPLETE  Result Date: 02/21/2021    ECHOCARDIOGRAM REPORT   Patient Name:   SINCLAIR ALLIGOOD Date of Exam: 02/21/2021 Medical Rec #:  [id number]      Height:       75.0 in Accession #:    [id number]     Weight:       422.6 lb Date of Birth:  25-Jan-1956      BSA:          3.016 m Patient Age:    66 years       BP:           106/64 mmHg Patient Gender: M              HR:           78 bpm. Exam Location:  [facility] Procedure: 2D Echo, Cardiac Doppler and Color Doppler Indications:     Cardiomyopathy -unspecified I42.9  History:         Patient has prior history of Echocardiogram examinations, most                  recent 05/27/2019. COPD; Risk Factors:Hypertension and Sleep                  Apnea. Tobacco use.  Sonographer:     Sherrie Sport Referring Phys:  [id number] CARMEN L GONZALEZ Diagnosing Phys: Neoma Laming  Sonographer Comments: Echo performed with patient supine and on artificial respirator. Image acquisition challenging due to patient body habitus. IMPRESSIONS  1. Left ventricular ejection fraction, by estimation, is 60 to 65%. The left ventricle has normal function. The left ventricle has no regional  wall motion abnormalities. Left ventricular diastolic parameters are consistent with Grade III  diastolic dysfunction (restrictive).  2. Right ventricular systolic function is normal. The right ventricular size is normal.  3. Left atrial size was mildly dilated.  4. The mitral valve is normal in structure. Trivial mitral valve regurgitation. No evidence of mitral stenosis.  5. The aortic valve is normal in structure. Aortic valve regurgitation is not visualized. No aortic stenosis is present.  6. The inferior vena cava is normal in size with greater than 50% respiratory variability, suggesting right atrial pressure of 3 mmHg. FINDINGS  Left Ventricle: Left ventricular ejection fraction, by estimation, is 60 to 65%. The left ventricle has normal function. The left ventricle has no regional wall motion abnormalities. The left ventricular internal cavity size was normal in size. There is  no left ventricular hypertrophy. Left ventricular diastolic parameters are consistent with Grade III diastolic dysfunction (restrictive). Right Ventricle: The right ventricular size is normal. No increase in right ventricular wall thickness. Right ventricular systolic function is normal. Left Atrium: Left atrial size was mildly dilated. Right Atrium: Right atrial size was normal in size. Pericardium: There is no evidence of pericardial effusion. Mitral Valve: The mitral valve is normal in structure. Trivial mitral valve regurgitation. No evidence of mitral valve stenosis. MV peak gradient, 3.6 mmHg. The mean mitral valve gradient is 2.0 mmHg. Tricuspid Valve: The tricuspid valve is normal in structure. Tricuspid valve regurgitation is trivial. No evidence of tricuspid stenosis. Aortic Valve: The aortic valve is normal in structure. Aortic valve regurgitation is not visualized. No aortic stenosis is present. Aortic valve mean gradient measures 8.0 mmHg. Aortic valve peak gradient measures 14.7 mmHg. Aortic valve area, by VTI measures 2.48 cm. Pulmonic Valve: The pulmonic valve was normal in structure. Pulmonic valve  regurgitation is not visualized. No evidence of pulmonic stenosis. Aorta: The aortic root is normal in size and structure. Venous: The inferior vena cava is normal in size with greater than 50% respiratory variability, suggesting right atrial pressure of 3 mmHg. IAS/Shunts: No atrial level shunt detected by color flow Doppler.  LEFT VENTRICLE PLAX 2D LVIDd:         5.22 cm   Diastology LVIDs:         3.01 cm   LV e' medial:    5.33 cm/s LV PW:         1.38 cm   LV E/e' medial:  13.8 LV IVS:        1.32 cm   LV e' lateral:   6.20 cm/s LVOT diam:     2.10 cm   LV E/e' lateral: 11.9 LV SV:         85 LV SV Index:   28 LVOT Area:     3.46 cm  RIGHT VENTRICLE RV S prime:     20.20 cm/s TAPSE (M-mode): 5.1 cm LEFT ATRIUM             Index        RIGHT ATRIUM           Index LA diam:        3.70 cm 1.23 cm/m   RA Area:     27.50 cm LA Vol (A2C):   71.9 ml 23.84 ml/m  RA Volume:   83.30 ml  27.62 ml/m LA Vol (A4C):   30.4 ml 10.08 ml/m LA Biplane Vol: 46.4 ml 15.39 ml/m  AORTIC VALVE AV Area (Vmax):    2.02 cm AV Area (Vmean):  1.84 cm AV Area (VTI):     2.48 cm AV Vmax:           192.00 cm/s AV Vmean:          133.000 cm/s AV VTI:            0.341 m AV Peak Grad:      14.7 mmHg AV Mean Grad:      8.0 mmHg LVOT Vmax:         112.00 cm/s LVOT Vmean:        70.800 cm/s LVOT VTI:          0.244 m LVOT/AV VTI ratio: 0.72  AORTA Ao Root diam: 3.20 cm MITRAL VALVE               TRICUSPID VALVE MV Area (PHT): 4.24 cm    TR Peak grad:   14.6 mmHg MV Area VTI:   3.31 cm    TR Vmax:        191.00 cm/s MV Peak grad:  3.6 mmHg MV Mean grad:  2.0 mmHg    SHUNTS MV Vmax:       0.95 m/s    Systemic VTI:  0.24 m MV Vmean:      76.8 cm/s   Systemic Diam: 2.10 cm MV Decel Time: 179 msec MV E velocity: 73.70 cm/s MV A velocity: 82.30 cm/s MV E/A ratio:  0.90 Shaukat Khan Electronically signed by Neoma Laming Signature Date/Time: 02/21/2021/10:04:52 AM    Final    Results for orders placed or performed during the hospital  encounter of 02/18/21  Resp Panel by RT-PCR (Flu A&B, Covid) Nasopharyngeal Swab     Status: Abnormal   Collection Time: 02/18/21  8:31 PM   Specimen: Nasopharyngeal Swab; Nasopharyngeal(NP) swabs in vial transport medium  Result Value Ref Range Status   SARS Coronavirus 2 by RT PCR POSITIVE (A) NEGATIVE Final    Comment: RBV Trilby Leaver RN AT 2255 02/18/21 GAA (NOTE) SARS-CoV-2 target nucleic acids are DETECTED.  The SARS-CoV-2 RNA is generally detectable in upper respiratory specimens during the acute phase of infection. Positive results are indicative of the presence of the identified virus, but do not rule out bacterial infection or co-infection with other pathogens not detected by the test. Clinical correlation with patient history and other diagnostic information is necessary to determine patient infection status. The expected result is Negative.  Fact Sheet for Patients: EntrepreneurPulse.com.au  Fact Sheet for Healthcare Providers: IncredibleEmployment.be  This test is not yet approved or cleared by the [place] FDA and  has been authorized for detection and/or diagnosis of SARS-CoV-2 by FDA under an Emergency Use Authorization (EUA).  This EUA will remain in effect (meaning this test can be used) for the duration of  the COVID-19 dec laration under Section 564(b)(1) of the Act, 21 U.S.C. section 360bbb-3(b)(1), unless the authorization is terminated or revoked sooner.     Influenza A by PCR NEGATIVE NEGATIVE Final   Influenza B by PCR NEGATIVE NEGATIVE Final    Comment: (NOTE) The Xpert Xpress SARS-CoV-2/FLU/RSV plus assay is intended as an aid in the diagnosis of influenza from Nasopharyngeal swab specimens and should not be used as a sole basis for treatment. Nasal washings and aspirates are unacceptable for Xpert Xpress SARS-CoV-2/FLU/RSV testing.  Fact Sheet for Patients: EntrepreneurPulse.com.au  Fact  Sheet for Healthcare Providers: IncredibleEmployment.be  This test is not yet approved or cleared by the [place] FDA and has been authorized for detection and/or  diagnosis of SARS-CoV-2 by FDA under an Emergency Use Authorization (EUA). This EUA will remain in effect (meaning this test can be used) for the duration of the COVID-19 declaration under Section 564(b)(1) of the Act, 21 U.S.C. section 360bbb-3(b)(1), unless the authorization is terminated or revoked.  Performed at [facility], [address] [place], [place] [address]   Culture, Respiratory w Gram Stain     Status: None   Collection Time: 02/18/21 10:36 PM   Specimen: Tracheal Aspirate; Respiratory  Result Value Ref Range Status   Specimen Description   Final    TRACHEAL ASPIRATE Performed at [facility], [address], [place], [place] [address]    Special Requests   Final    NONE Performed at [facility], [address] [place], [place] [address]    Gram Stain   Final    FEW SQUAMOUS EPITHELIAL CELLS PRESENT FEW WBC SEEN FEW GRAM POSITIVE RODS MODERATE GRAM POSITIVE COCCI    Culture   Final    MODERATE Normal respiratory flora-no Staph aureus or Pseudomonas seen Performed at [facility], [address] [place], [place] [address]    Report Status 02/21/2021 FINAL  Final  MRSA Next Gen by PCR, Nasal     Status: None   Collection Time: 02/19/21 12:03 AM   Specimen: Nasal Mucosa; Nasal Swab  Result Value Ref Range Status   MRSA by PCR Next Gen NOT DETECTED NOT DETECTED Final    Comment: (NOTE) The GeneXpert MRSA Assay (FDA approved for NASAL specimens only), is one component of a comprehensive MRSA colonization surveillance program. It is not intended to diagnose MRSA infection nor to guide or monitor treatment for MRSA infections. Test performance is not FDA approved in patients less than 6 years old. Performed at [facility], [place], [place] [address]   Respiratory (~20 pathogens) panel by PCR     Status: None   Collection Time: 02/19/21 12:57 AM   Specimen: Nasopharyngeal Swab; Respiratory  Result Value Ref Range Status   Adenovirus NOT DETECTED NOT DETECTED Final   Coronavirus 229E NOT DETECTED NOT DETECTED Final    Comment: (NOTE) The Coronavirus on the Respiratory Panel, DOES NOT test for the novel  Coronavirus (2019 nCoV)    Coronavirus HKU1 NOT DETECTED NOT DETECTED Final   Coronavirus NL63 NOT DETECTED NOT DETECTED Final   Coronavirus OC43 NOT DETECTED NOT DETECTED Final   Metapneumovirus NOT DETECTED NOT DETECTED Final   Rhinovirus / Enterovirus NOT DETECTED NOT DETECTED Final   Influenza A NOT DETECTED NOT DETECTED Final   Influenza B NOT DETECTED NOT DETECTED Final   Parainfluenza Virus 1 NOT DETECTED NOT DETECTED Final   Parainfluenza Virus 2 NOT DETECTED NOT DETECTED Final   Parainfluenza Virus 3 NOT DETECTED NOT DETECTED Final   Parainfluenza Virus 4 NOT DETECTED NOT DETECTED Final   Respiratory Syncytial Virus NOT DETECTED NOT DETECTED Final   Bordetella pertussis NOT DETECTED NOT DETECTED Final   Bordetella Parapertussis NOT DETECTED NOT DETECTED Final   Chlamydophila pneumoniae NOT DETECTED NOT DETECTED Final   Mycoplasma pneumoniae NOT DETECTED NOT DETECTED Final    Comment: Performed at [facility], [place]. [address], [place],  [address]    Signed:  Ezekiel Slocumb MD.  [facility] 03/21/2021, 1:52 PM
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
## 2021-03-28 ENCOUNTER — Ambulatory Visit: Payer: Medicaid Other | Admitting: Family

## 2021-03-28 ENCOUNTER — Telehealth: Payer: Self-pay | Admitting: Family

## 2021-03-28 NOTE — Telephone Encounter (Signed)
Patient did not show for his [facility] Failure [facility] appointment on 03/28/21. Will attempt to reschedule.   °

## 2021-04-04 ENCOUNTER — Other Ambulatory Visit: Payer: Self-pay

## 2021-04-04 ENCOUNTER — Emergency Department: Payer: Medicaid Other

## 2021-04-04 ENCOUNTER — Inpatient Hospital Stay
Admission: EM | Admit: 2021-04-04 | Discharge: 2021-04-11 | DRG: 291 | Disposition: A | Discharge status: Home / Self Care | Payer: Medicaid Other | Attending: Internal Medicine | Admitting: Internal Medicine

## 2021-04-04 DIAGNOSIS — J9622 Acute and chronic respiratory failure with hypercapnia: Secondary | ICD-10-CM | POA: Diagnosis present

## 2021-04-04 DIAGNOSIS — I5043 Acute on chronic combined systolic (congestive) and diastolic (congestive) heart failure: Secondary | ICD-10-CM | POA: Diagnosis present

## 2021-04-04 DIAGNOSIS — Z8701 Personal history of pneumonia (recurrent): Secondary | ICD-10-CM | POA: Diagnosis not present

## 2021-04-04 DIAGNOSIS — J449 Chronic obstructive pulmonary disease, unspecified: Secondary | ICD-10-CM | POA: Diagnosis present

## 2021-04-04 DIAGNOSIS — G4733 Obstructive sleep apnea (adult) (pediatric): Secondary | ICD-10-CM | POA: Diagnosis not present

## 2021-04-04 DIAGNOSIS — Z20822 Contact with and (suspected) exposure to covid-19: Secondary | ICD-10-CM | POA: Diagnosis present

## 2021-04-04 DIAGNOSIS — J9601 Acute respiratory failure with hypoxia: Secondary | ICD-10-CM

## 2021-04-04 DIAGNOSIS — J441 Chronic obstructive pulmonary disease with (acute) exacerbation: Secondary | ICD-10-CM | POA: Diagnosis present

## 2021-04-04 DIAGNOSIS — Z91199 Patient's noncompliance with other medical treatment and regimen due to unspecified reason: Secondary | ICD-10-CM

## 2021-04-04 DIAGNOSIS — K219 Gastro-esophageal reflux disease without esophagitis: Secondary | ICD-10-CM | POA: Diagnosis present

## 2021-04-04 DIAGNOSIS — F419 Anxiety disorder, unspecified: Secondary | ICD-10-CM | POA: Diagnosis present

## 2021-04-04 DIAGNOSIS — D509 Iron deficiency anemia, unspecified: Secondary | ICD-10-CM | POA: Diagnosis present

## 2021-04-04 DIAGNOSIS — J810 Acute pulmonary edema: Secondary | ICD-10-CM | POA: Diagnosis not present

## 2021-04-04 DIAGNOSIS — I739 Peripheral vascular disease, unspecified: Secondary | ICD-10-CM | POA: Diagnosis present

## 2021-04-04 DIAGNOSIS — J9621 Acute and chronic respiratory failure with hypoxia: Secondary | ICD-10-CM | POA: Diagnosis present

## 2021-04-04 DIAGNOSIS — Z87891 Personal history of nicotine dependence: Secondary | ICD-10-CM

## 2021-04-04 DIAGNOSIS — J9811 Atelectasis: Secondary | ICD-10-CM | POA: Diagnosis present

## 2021-04-04 DIAGNOSIS — Z79899 Other long term (current) drug therapy: Secondary | ICD-10-CM

## 2021-04-04 DIAGNOSIS — Z8719 Personal history of other diseases of the digestive system: Secondary | ICD-10-CM

## 2021-04-04 DIAGNOSIS — Z7902 Long term (current) use of antithrombotics/antiplatelets: Secondary | ICD-10-CM

## 2021-04-04 DIAGNOSIS — Z7982 Long term (current) use of aspirin: Secondary | ICD-10-CM

## 2021-04-04 DIAGNOSIS — G9349 Other encephalopathy: Secondary | ICD-10-CM | POA: Diagnosis present

## 2021-04-04 DIAGNOSIS — I509 Heart failure, unspecified: Secondary | ICD-10-CM

## 2021-04-04 DIAGNOSIS — E662 Morbid (severe) obesity with alveolar hypoventilation: Secondary | ICD-10-CM | POA: Diagnosis present

## 2021-04-04 DIAGNOSIS — R0902 Hypoxemia: Secondary | ICD-10-CM

## 2021-04-04 DIAGNOSIS — Z8711 Personal history of peptic ulcer disease: Secondary | ICD-10-CM

## 2021-04-04 DIAGNOSIS — Z9981 Dependence on supplemental oxygen: Secondary | ICD-10-CM | POA: Diagnosis not present

## 2021-04-04 DIAGNOSIS — Z8616 Personal history of COVID-19: Secondary | ICD-10-CM | POA: Diagnosis not present

## 2021-04-04 DIAGNOSIS — I959 Hypotension, unspecified: Secondary | ICD-10-CM | POA: Diagnosis present

## 2021-04-04 DIAGNOSIS — Z7951 Long term (current) use of inhaled steroids: Secondary | ICD-10-CM

## 2021-04-04 DIAGNOSIS — R0602 Shortness of breath: Secondary | ICD-10-CM

## 2021-04-04 DIAGNOSIS — Z6841 Body Mass Index (BMI) 40.0 and over, adult: Secondary | ICD-10-CM

## 2021-04-04 DIAGNOSIS — R0600 Dyspnea, unspecified: Secondary | ICD-10-CM | POA: Diagnosis present

## 2021-04-04 DIAGNOSIS — I110 Hypertensive heart disease with heart failure: Principal | ICD-10-CM | POA: Diagnosis present

## 2021-04-04 DIAGNOSIS — I10 Essential (primary) hypertension: Secondary | ICD-10-CM

## 2021-04-04 DIAGNOSIS — E785 Hyperlipidemia, unspecified: Secondary | ICD-10-CM | POA: Diagnosis present

## 2021-04-04 DIAGNOSIS — R5381 Other malaise: Secondary | ICD-10-CM | POA: Diagnosis present

## 2021-04-04 DIAGNOSIS — J9620 Acute and chronic respiratory failure, unspecified whether with hypoxia or hypercapnia: Secondary | ICD-10-CM | POA: Diagnosis present

## 2021-04-04 DIAGNOSIS — I5031 Acute diastolic (congestive) heart failure: Secondary | ICD-10-CM | POA: Diagnosis not present

## 2021-04-04 DIAGNOSIS — R7989 Other specified abnormal findings of blood chemistry: Secondary | ICD-10-CM | POA: Diagnosis present

## 2021-04-04 DIAGNOSIS — I5033 Acute on chronic diastolic (congestive) heart failure: Secondary | ICD-10-CM | POA: Diagnosis not present

## 2021-04-04 HISTORY — DX: Unspecified diastolic (congestive) heart failure: I50.30

## 2021-04-04 LAB — COMPREHENSIVE METABOLIC PANEL
ALT: 19 U/L (ref 0–44)
AST: 16 U/L (ref 15–41)
Albumin: 4.1 g/dL (ref 3.5–5.0)
Alkaline Phosphatase: 119 U/L (ref 38–126)
Anion gap: 9 (ref 5–15)
BUN: 16 mg/dL (ref 8–23)
CO2: 36 mmol/L — ABNORMAL HIGH (ref 22–32)
Calcium: 9.3 mg/dL (ref 8.9–10.3)
Chloride: 100 mmol/L (ref 98–111)
Creatinine, Ser: 0.92 mg/dL (ref 0.61–1.24)
GFR, Estimated: >60 mL/min (ref >60)
Glucose, Bld: 82 mg/dL (ref 70–99)
Potassium: 4.5 mmol/L (ref 3.5–5.1)
Sodium: 145 mmol/L (ref 135–145)
Total Bilirubin: 0.8 mg/dL (ref 0.3–1.2)
Total Protein: 9.1 g/dL — ABNORMAL HIGH (ref 6.5–8.1)

## 2021-04-04 LAB — CBC
HCT: 56.1 % — ABNORMAL HIGH (ref 39.0–52.0)
HCT: 53.2 % — ABNORMAL HIGH (ref 39.0–52.0)
Hemoglobin: 14.6 g/dL (ref 13.0–17.0)
Hemoglobin: 13.7 g/dL (ref 13.0–17.0)
MCH: 23.5 pg — ABNORMAL LOW (ref 26.0–34.0)
MCH: 23.7 pg — ABNORMAL LOW (ref 26.0–34.0)
MCHC: 26 g/dL — ABNORMAL LOW (ref 30.0–36.0)
MCHC: 25.8 g/dL — ABNORMAL LOW (ref 30.0–36.0)
MCV: 90.5 fL (ref 80.0–100.0)
MCV: 91.9 fL (ref 80.0–100.0)
Platelets: 158 10*3/uL (ref 150–400)
Platelets: 177 10*3/uL (ref 150–400)
RBC: 6.2 MIL/uL — ABNORMAL HIGH (ref 4.22–5.81)
RBC: 5.79 MIL/uL (ref 4.22–5.81)
RDW: 20.1 % — ABNORMAL HIGH (ref 11.5–15.5)
RDW: 19.9 % — ABNORMAL HIGH (ref 11.5–15.5)
WBC: 7.8 10*3/uL (ref 4.0–10.5)
WBC: 8.8 10*3/uL (ref 4.0–10.5)
nRBC: 2.3 % — ABNORMAL HIGH (ref 0.0–0.2)
nRBC: 1.6 % — ABNORMAL HIGH (ref 0.0–0.2)

## 2021-04-04 LAB — RESP PANEL BY RT-PCR (FLU A&B, COVID) ARPGX2
Influenza A by PCR: NEGATIVE
Influenza B by PCR: NEGATIVE
SARS Coronavirus 2 by RT PCR: NEGATIVE

## 2021-04-04 LAB — TROPONIN I (HIGH SENSITIVITY)
Troponin I (High Sensitivity): 8 ng/L (ref <18)
Troponin I (High Sensitivity): 10 ng/L (ref <18)

## 2021-04-04 LAB — GLUCOSE, CAPILLARY: Glucose-Capillary: 93 mg/dL (ref 70–99)

## 2021-04-04 LAB — CREATININE, SERUM
Creatinine, Ser: 0.76 mg/dL (ref 0.61–1.24)
GFR, Estimated: >60 mL/min (ref >60)

## 2021-04-04 LAB — BRAIN NATRIURETIC PEPTIDE: B Natriuretic Peptide: 239.7 pg/mL — ABNORMAL HIGH (ref 0.0–100.0)

## 2021-04-04 LAB — MRSA NEXT GEN BY PCR, NASAL: MRSA by PCR Next Gen: NOT DETECTED

## 2021-04-04 MED ORDER — IPRATROPIUM-ALBUTEROL 0.5-2.5 (3) MG/3ML IN SOLN
3.0000 mL | Freq: Four times a day (QID) | RESPIRATORY_TRACT | Status: DC
Start: 2021-04-05 — End: 2021-04-06
  Administered 2021-04-05 – 2021-04-06 (×6): 3 mL via RESPIRATORY_TRACT
  Filled 2021-04-04 (×7): qty 3

## 2021-04-04 MED ORDER — FLUTICASONE PROPIONATE 50 MCG/ACT NA SUSP
2.0000 | Freq: Every day | NASAL | Status: DC
  Filled 2021-04-04: qty 16

## 2021-04-04 MED ORDER — FERROUS SULFATE 325 (65 FE) MG PO TABS
325.0000 mg | ORAL_TABLET | Freq: Two times a day (BID) | ORAL | Status: DC
  Administered 2021-04-05 – 2021-04-11 (×13): 325 mg via ORAL
  Filled 2021-04-04 (×13): qty 1

## 2021-04-04 MED ORDER — FUROSEMIDE 10 MG/ML IJ SOLN
40.0000 mg | Freq: Two times a day (BID) | INTRAMUSCULAR | Status: DC
  Administered 2021-04-05 – 2021-04-08 (×7): 40 mg via INTRAVENOUS
  Filled 2021-04-04 (×7): qty 4

## 2021-04-04 MED ORDER — ALPRAZOLAM 0.25 MG PO TABS: 0.2500 mg | ORAL_TABLET | Freq: Two times a day (BID) | ORAL | Status: DC | PRN

## 2021-04-04 MED ORDER — DEXTROSE 50 % IV SOLN: 12.5000 g | INTRAVENOUS | Status: AC

## 2021-04-04 MED ORDER — CHLORHEXIDINE GLUCONATE CLOTH 2 % EX PADS
6.0000 | MEDICATED_PAD | Freq: Every day | CUTANEOUS | Status: DC
  Administered 2021-04-04 – 2021-04-08 (×5): 6 via TOPICAL

## 2021-04-04 MED ORDER — SODIUM CHLORIDE 0.9% FLUSH
3.0000 mL | Freq: Two times a day (BID) | INTRAVENOUS | Status: DC
  Administered 2021-04-04 – 2021-04-11 (×14): 3 mL via INTRAVENOUS

## 2021-04-04 MED ORDER — ONDANSETRON HCL 4 MG/2ML IJ SOLN: 4.0000 mg | Freq: Four times a day (QID) | INTRAMUSCULAR | Status: DC | PRN

## 2021-04-04 MED ORDER — LORATADINE 10 MG PO TABS
10.0000 mg | ORAL_TABLET | Freq: Every day | ORAL | Status: DC
  Administered 2021-04-04 – 2021-04-11 (×8): 10 mg via ORAL
  Filled 2021-04-04 (×8): qty 1

## 2021-04-04 MED ORDER — SIMVASTATIN 20 MG PO TABS
10.0000 mg | ORAL_TABLET | Freq: Every day | ORAL | Status: DC
  Administered 2021-04-05 – 2021-04-10 (×6): 10 mg via ORAL
  Filled 2021-04-04 (×6): qty 1

## 2021-04-04 MED ORDER — IPRATROPIUM-ALBUTEROL 0.5-2.5 (3) MG/3ML IN SOLN: 3.0000 mL | Freq: Four times a day (QID) | RESPIRATORY_TRACT | Status: DC

## 2021-04-04 MED ORDER — PANTOPRAZOLE SODIUM 40 MG PO TBEC
40.0000 mg | DELAYED_RELEASE_TABLET | Freq: Every day | ORAL | Status: DC
  Administered 2021-04-04 – 2021-04-11 (×8): 40 mg via ORAL
  Filled 2021-04-04 (×8): qty 1

## 2021-04-04 MED ORDER — SODIUM CHLORIDE 0.9 % IV SOLN: 250.0000 mL | INTRAVENOUS | Status: DC | PRN

## 2021-04-04 MED ORDER — FUROSEMIDE 10 MG/ML IJ SOLN
60.0000 mg | Freq: Once | INTRAMUSCULAR | Status: AC
  Administered 2021-04-04: 20:00:00 60 mg via INTRAVENOUS
  Filled 2021-04-04: qty 8

## 2021-04-04 MED ORDER — LOSARTAN POTASSIUM 25 MG PO TABS
25.0000 mg | ORAL_TABLET | Freq: Every day | ORAL | Status: DC
  Administered 2021-04-04 – 2021-04-11 (×8): 25 mg via ORAL
  Filled 2021-04-04 (×9): qty 1

## 2021-04-04 MED ORDER — LISINOPRIL 5 MG PO TABS: 5.0000 mg | ORAL_TABLET | Freq: Every day | ORAL | Status: DC

## 2021-04-04 MED ORDER — SALINE SPRAY 0.65 % NA SOLN
1.0000 | NASAL | Status: DC | PRN
  Filled 2021-04-04: qty 44

## 2021-04-04 MED ORDER — ENOXAPARIN SODIUM 40 MG/0.4ML IJ SOSY: 40.0000 mg | PREFILLED_SYRINGE | INTRAMUSCULAR | Status: DC

## 2021-04-04 MED ORDER — POTASSIUM CHLORIDE CRYS ER 10 MEQ PO TBCR
10.0000 meq | EXTENDED_RELEASE_TABLET | Freq: Every day | ORAL | Status: DC
  Administered 2021-04-04 – 2021-04-11 (×8): 10 meq via ORAL
  Filled 2021-04-04 (×8): qty 1

## 2021-04-04 MED ORDER — CARVEDILOL 6.25 MG PO TABS
6.2500 mg | ORAL_TABLET | Freq: Every day | ORAL | Status: DC
  Administered 2021-04-04 – 2021-04-11 (×8): 6.25 mg via ORAL
  Filled 2021-04-04 (×8): qty 1

## 2021-04-04 MED ORDER — DEXTROSE 50 % IV SOLN
INTRAVENOUS | Status: AC
  Administered 2021-04-04: 23:00:00 12.5 g via INTRAVENOUS
  Filled 2021-04-04: qty 50

## 2021-04-04 MED ORDER — ASPIRIN EC 81 MG PO TBEC
81.0000 mg | DELAYED_RELEASE_TABLET | Freq: Every day | ORAL | Status: DC
  Administered 2021-04-04 – 2021-04-11 (×8): 81 mg via ORAL
  Filled 2021-04-04 (×8): qty 1

## 2021-04-04 MED ORDER — ACETAMINOPHEN 325 MG PO TABS
650.0000 mg | ORAL_TABLET | ORAL | Status: DC | PRN
  Administered 2021-04-07 – 2021-04-11 (×8): 650 mg via ORAL
  Filled 2021-04-04 (×8): qty 2

## 2021-04-04 MED ORDER — TICAGRELOR 90 MG PO TABS
90.0000 mg | ORAL_TABLET | Freq: Two times a day (BID) | ORAL | Status: DC
  Administered 2021-04-04 – 2021-04-05 (×2): 90 mg via ORAL
  Filled 2021-04-04 (×2): qty 1

## 2021-04-04 MED ORDER — ENOXAPARIN SODIUM 100 MG/ML IJ SOSY
0.5000 mg/kg | PREFILLED_SYRINGE | INTRAMUSCULAR | Status: DC
  Administered 2021-04-05 – 2021-04-10 (×6): 90 mg via SUBCUTANEOUS
  Filled 2021-04-04 (×8): qty 0.9

## 2021-04-04 MED ORDER — BUDESONIDE 0.25 MG/2ML IN SUSP
0.2500 mg | Freq: Two times a day (BID) | RESPIRATORY_TRACT | Status: DC
  Administered 2021-04-05: 08:00:00 0.25 mg via RESPIRATORY_TRACT
  Filled 2021-04-04: qty 2

## 2021-04-04 MED ORDER — SODIUM CHLORIDE 0.9% FLUSH: 3.0000 mL | INTRAVENOUS | Status: DC | PRN

## 2021-04-04 NOTE — ED Triage Notes (Signed)
Pt bib ems from home with respiratory distress oxygen saturations 50%. Pt placed on bipap with some relief. Saturations 78% on CPAP on arrival. Pt recently hospitalized for covid PNA.

## 2021-04-04 NOTE — Progress Notes (Signed)
PHARMACIST - PHYSICIAN COMMUNICATION  CONCERNING:  Enoxaparin (Lovenox) for DVT Prophylaxis    RECOMMENDATION: Patient was prescribed enoxaprin 40mg  q24 hours for VTE prophylaxis.   Filed Weights   04/04/21 1918  Weight: (!) 181 kg (399 lb 0.5 oz)    Body mass index is 49.88 kg/m.  Estimated Creatinine Clearance: 143.1 mL/min (by C-G formula based on SCr of 0.92 mg/dL).   Based on [place] patient is candidate for enoxaparin 0.5mg /kg TBW SQ every 24 hours based on BMI being >30.  DESCRIPTION: Pharmacy has adjusted enoxaparin dose per [facility] policy.  Patient is now receiving enoxaparin 90 mg every 24 hours    Berta Minor, PharmD Clinical Pharmacist  04/04/2021 7:25 PM
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
## 2021-04-05 ENCOUNTER — Encounter: Payer: Self-pay | Admitting: Family Medicine

## 2021-04-05 DIAGNOSIS — J810 Acute pulmonary edema: Secondary | ICD-10-CM

## 2021-04-05 DIAGNOSIS — E662 Morbid (severe) obesity with alveolar hypoventilation: Secondary | ICD-10-CM

## 2021-04-05 DIAGNOSIS — R0902 Hypoxemia: Secondary | ICD-10-CM

## 2021-04-05 LAB — BASIC METABOLIC PANEL
Anion gap: 5 (ref 5–15)
BUN: 19 mg/dL (ref 8–23)
CO2: 40 mmol/L — ABNORMAL HIGH (ref 22–32)
Calcium: 8.5 mg/dL — ABNORMAL LOW (ref 8.9–10.3)
Chloride: 99 mmol/L (ref 98–111)
Creatinine, Ser: 1.02 mg/dL (ref 0.61–1.24)
GFR, Estimated: >60 mL/min (ref >60)
Glucose, Bld: 73 mg/dL (ref 70–99)
Potassium: 4.8 mmol/L (ref 3.5–5.1)
Sodium: 144 mmol/L (ref 135–145)

## 2021-04-05 LAB — TROPONIN I (HIGH SENSITIVITY): Troponin I (High Sensitivity): 11 ng/L (ref <18)

## 2021-04-05 LAB — GLUCOSE, CAPILLARY: Glucose-Capillary: 69 mg/dL — ABNORMAL LOW (ref 70–99)

## 2021-04-05 MED ORDER — BUDESONIDE 0.25 MG/2ML IN SUSP
0.2500 mg | Freq: Two times a day (BID) | RESPIRATORY_TRACT | Status: DC
Start: 2021-04-05 — End: 2021-04-12
  Administered 2021-04-05 – 2021-04-11 (×12): 0.25 mg via RESPIRATORY_TRACT
  Filled 2021-04-05 (×12): qty 2

## 2021-04-05 NOTE — Progress Notes (Signed)
PROGRESS NOTE    Gary [name]  IEP:[id number] DOB: 03/12/1956 DOA: 04/04/2021 PCP: [facility] [place]    Brief Narrative:  Gary [name] is a 65 year old male with past medical history significant for chronic diastolic congestive heart failure, COPD, essential hypertension, PVD, obstructive sleep apnea, recent COVID-19 viral pneumonia who presents to [facility] ED on 12/20 via EMS for shortness of breath.  On EMS arrival patient was noted to be hypoxic with SPO2's in the 50s on 2 L nasal cannula and was placed on CPAP.  Recent admission for COVID-19 viral pneumonia with respiratory failure and ARDS requiring intubation on 02/21/2021 and transferred to [facility] for further management.  Patient was subsequently extubated on 02/26/2021 with gradual improvement and transferred back to [facility] on 03/11/2021.  Patient was discharge to home with home health on 03/21/2021.  In the ED, temperature 98.1 F, HR 78, BP 124/71, respiratory rate 27, SPO2 76% on CPAP which was subsequently transitioned to BiPAP.  Sodium 145, potassium 4.5, chloride 100, CO2 36, glucose 82, BUN 16, creatinine 0.92.  BNP 239.7, high sensitive troponin 8>10>11; within normal limits.  WBC 7.8, hemoglobin 14.6, platelets 158.  Covid-19 PCR negative peer influenza A/B PCR negative.  Chest x-ray with changes of mild CHF with new bibasilar atelectasis.  EKG with sinus rhythm, rate 80 with poor R wave progression.  EDP administered 60 mg IV Lasix and continued on BiPAP.  Hospital service consulted for further evaluation and management of decompensated congestive heart failure exacerbation with acute on chronic hypoxic respiratory failure.   Assessment & Plan:   Principal Problem:   Acute CHF (congestive heart failure) ([name])   Acute on chronic hypoxic respiratory failure, POA At baseline, patient on 2 L nasal cannula.  On EMS arrival was found to be oxygenating in the 50s on 2 L and subsequently placed on  BiPAP.  Suspect etiology multifactorial in the setting of decompensated CHF and likely noncompliance with home CPAP/BiPAP. --Continue treatment as below  Chronic diastolic congestive heart failure, decompensated Patient presenting with progressive shortness of breath, found to be hypoxic with SPO2 in the 50s on his home 2 L nasal cannula.  Etiology likely secondary to decompensated CHF with elevated BNP and vascular congestion on chest x-ray. --Cardiology following, appreciate assistance --net negative 1.5L since admission --Furosemide 40 mg IV every 12 hours --Strict I's and O's Daily weights  Hx OSA Per previous TOC notes, patient was due to receive a trilogy NIV ventilator. --Continue BiPAP nightly and while napping --Titrate supplemental oxygen to maintain SPO2 greater than 88% --VBG in the a.m.  COPD on home oxygen 2-3 L nasal cannula --Pulmicort neb twice daily --duonebs --Titrate supplemental oxygen to maintain SPO2 greater than 88%  Essential hypertension --Carvedilol 6.25 mg p.o. daily --Losartan 25 mg p.o. daily  Peripheral vascular disease --Continue aspirin and statin  Iron deficiency anemia --Ferrous sulfate 325 mg p.o. twice daily  GERD: Protonix 40 mg p.o. daily  Anxiety: Xanax 0.25 mg p.o. twice daily as needed  Hx of recent COVID-19 viral pneumonia  Patient with recent hospitalization with COVID-19 viral pneumonia and subsequent ARDS requiring ventilatory support.  COVID-19/influenza A/B PCR negative on this admission. --Continue supportive care as above, oxygen  Morbid obesity Body mass index is 51.53 kg/m.  Discussed with patient needs for aggressive lifestyle changes/weight loss as this complicates all facets of care.  Outpatient follow-up with PCP.  May benefit from bariatric evaluation outpatient.  DVT prophylaxis:   Lovenox  Code Status: Full Code Family Communication: No family present at bedside this morning  Disposition Plan:  Level of care:  Stepdown Status is: Inpatient  Remains inpatient appropriate because: Continues on high O2 supplementation/BiPAP, on IV diuresis     Consultants:  Cardiology  Procedures:  None  Antimicrobials:  None   Subjective: Patient seen examined bedside, resting comfortably.  Remains slightly confused.  Remains short of breath.  On BiPAP.  No family present.  Denies headache, no chest pain, no abdominal pain.  No other questions or concerns at this time.  No acute concerns overnight per nursing staff.  Objective: Vitals:   04/05/21 1400 04/05/21 1500 04/05/21 1600 04/05/21 1700  BP:  106/77 111/78   Pulse: 100 83 80 85  Resp: 20  20 16   Temp:   99.3 F (37.4 C)   TempSrc:   Oral   SpO2: (!) 73% 99% 99% 95%  Weight:      Height:        Intake/Output Summary (Last 24 hours) at 04/05/2021 1749 Last data filed at 04/05/2021 1500 Gross per 24 hour  Intake 240 ml  Output 1750 ml  Net -1510 ml   Filed Weights   04/04/21 1918 04/04/21 2220 04/05/21 0457  Weight: (!) 181 kg (!) 187.1 kg (!) 187 kg    Examination:  General exam: Appears calm and comfortable, pleasantly confused, obese Respiratory system: Clear to auscultation. Respiratory effort normal. Cardiovascular system: S1 & S2 heard, RRR. No JVD, murmurs, rubs, gallops or clicks. No pedal edema. Gastrointestinal system: Abdomen is nondistended, soft and nontender. No organomegaly or masses felt. Normal bowel sounds heard. Central nervous system: Alert and oriented. No focal neurological deficits. Extremities: Symmetric 5 x 5 power. Skin: No rashes, lesions or ulcers Psychiatry: Judgement and insight appear normal. Mood & affect appropriate.     Data Reviewed: I have personally reviewed following labs and imaging studies  CBC: Recent Labs  Lab 04/04/21 1539 04/04/21 2021  WBC 7.8 8.8  HGB 14.6 13.7  HCT 56.1* 53.2*  MCV 90.5 91.9  PLT 158 616   Basic Metabolic Panel: Recent Labs  Lab 04/04/21 1725  04/04/21 2021 04/05/21 0950  NA 145  --  144  K 4.5  --  4.8  CL 100  --  99  CO2 36*  --  40*  GLUCOSE 82  --  73  BUN 16  --  19  CREATININE 0.92 0.76 1.02  CALCIUM 9.3  --  8.5*   GFR: Estimated Creatinine Clearance: 131.6 mL/min (by C-G formula based on SCr of 1.02 mg/dL). Liver Function Tests: Recent Labs  Lab 04/04/21 1725  AST 16  ALT 19  ALKPHOS 119  BILITOT 0.8  PROT 9.1*  ALBUMIN 4.1   No results for input(s): LIPASE, AMYLASE in the last 168 hours. No results for input(s): AMMONIA in the last 168 hours. Coagulation Profile: No results for input(s): INR, PROTIME in the last 168 hours. Cardiac Enzymes: No results for input(s): CKTOTAL, CKMB, CKMBINDEX, TROPONINI in the last 168 hours. BNP (last 3 results) No results for input(s): PROBNP in the last 8760 hours. HbA1C: No results for input(s): HGBA1C in the last 72 hours. CBG: Recent Labs  Lab 04/04/21 2229 04/04/21 2340  GLUCAP 69* 93   Lipid Profile: No results for input(s): CHOL, HDL, LDLCALC, TRIG, CHOLHDL, LDLDIRECT in the last 72 hours. Thyroid Function Tests: No results for input(s): TSH, T4TOTAL, FREET4, T3FREE, THYROIDAB in the last 72 hours. Anemia Panel: No  results for input(s): VITAMINB12, FOLATE, FERRITIN, TIBC, IRON, RETICCTPCT in the last 72 hours. Sepsis Labs: No results for input(s): PROCALCITON, LATICACIDVEN in the last 168 hours.  Recent Results (from the past 240 hour(s))  Resp Panel by RT-PCR (Flu A&B, Covid) Nasopharyngeal Swab     Status: None   Collection Time: 04/04/21  3:39 PM   Specimen: Nasopharyngeal Swab; Nasopharyngeal(NP) swabs in vial transport medium  Result Value Ref Range Status   SARS Coronavirus 2 by RT PCR NEGATIVE NEGATIVE Final    Comment: (NOTE) SARS-CoV-2 target nucleic acids are NOT DETECTED.  The SARS-CoV-2 RNA is generally detectable in upper respiratory specimens during the acute phase of infection. The lowest concentration of SARS-CoV-2 viral copies  this assay can detect is 138 copies/mL. A negative result does not preclude SARS-Cov-2 infection and should not be used as the sole basis for treatment or other patient management decisions. A negative result may occur with  improper specimen collection/handling, submission of specimen other than nasopharyngeal swab, presence of viral mutation(s) within the areas targeted by this assay, and inadequate number of viral copies(<138 copies/mL). A negative result must be combined with clinical observations, patient history, and epidemiological information. The expected result is Negative.  Fact Sheet for Patients:  EntrepreneurPulse.com.au  Fact Sheet for Healthcare Providers:  IncredibleEmployment.be  This test is no t yet approved or cleared by the [place] FDA and  has been authorized for detection and/or diagnosis of SARS-CoV-2 by FDA under an Emergency Use Authorization (EUA). This EUA will remain  in effect (meaning this test can be used) for the duration of the COVID-19 declaration under Section 564(b)(1) of the Act, 21 U.S.C.section 360bbb-3(b)(1), unless the authorization is terminated  or revoked sooner.       Influenza A by PCR NEGATIVE NEGATIVE Final   Influenza B by PCR NEGATIVE NEGATIVE Final    Comment: (NOTE) The Xpert Xpress SARS-CoV-2/FLU/RSV plus assay is intended as an aid in the diagnosis of influenza from Nasopharyngeal swab specimens and should not be used as a sole basis for treatment. Nasal washings and aspirates are unacceptable for Xpert Xpress SARS-CoV-2/FLU/RSV testing.  Fact Sheet for Patients: EntrepreneurPulse.com.au  Fact Sheet for Healthcare Providers: IncredibleEmployment.be  This test is not yet approved or cleared by the [place] FDA and has been authorized for detection and/or diagnosis of SARS-CoV-2 by FDA under an Emergency Use Authorization (EUA). This EUA  will remain in effect (meaning this test can be used) for the duration of the COVID-19 declaration under Section 564(b)(1) of the Act, 21 U.S.C. section 360bbb-3(b)(1), unless the authorization is terminated or revoked.  Performed at [facility], [address] [place], [place] [address]   MRSA Next Gen by PCR, Nasal     Status: None   Collection Time: 04/04/21 10:29 PM   Specimen: Nasal Mucosa; Nasal Swab  Result Value Ref Range Status   MRSA by PCR Next Gen NOT DETECTED NOT DETECTED Final    Comment: (NOTE) The GeneXpert MRSA Assay (FDA approved for NASAL specimens only), is one component of a comprehensive MRSA colonization surveillance program. It is not intended to diagnose MRSA infection nor to guide or monitor treatment for MRSA infections. Test performance is not FDA approved in patients less than 21 years old. Performed at [facility], [address], [place], [place] [address]          Radiology Studies: DG Chest Portable 1 View  Result Date: 04/04/2021 CLINICAL DATA:  Shortness of breath EXAM:  PORTABLE CHEST 1 VIEW COMPARISON:  03/17/2021 FINDINGS: Cardiac shadow is enlarged. The overall inspiratory effort is poor. Prominent central vascularity is noted without significant edema. These changes are stable from the prior exam. Bibasilar atelectatic changes are noted new from the prior study. No bony abnormality is seen. IMPRESSION: Changes of mild CHF with new bibasilar atelectasis. Electronically Signed   By: [name] Catalina M.D.   On: 04/04/2021 16:22        Scheduled Meds:  aspirin EC  81 mg Oral Daily   budesonide  0.25 mg Nebulization BID   carvedilol  6.25 mg Oral Daily   Chlorhexidine Gluconate Cloth  6 each Topical Q0600   enoxaparin (LOVENOX) injection  0.5 mg/kg Subcutaneous Q24H   ferrous sulfate  325 mg Oral BID WC   [START ON 04/06/2021] fluticasone  2 spray Each Nare Daily   furosemide  40 mg Intravenous Q12H    ipratropium-albuterol  3 mL Nebulization Q6H   loratadine  10 mg Oral Daily   losartan  25 mg Oral Daily   pantoprazole  40 mg Oral Daily   potassium chloride  10 mEq Oral Daily   simvastatin  10 mg Oral q1800   sodium chloride flush  3 mL Intravenous Q12H   Continuous Infusions:  sodium chloride       LOS: 1 day    Time spent: 46 minutes spent on chart review, discussion with nursing staff, consultants, updating family and interview/physical exam; more than 50% of that time was spent in counseling and/or coordination of care.    Jerek Meulemans J [place] DO [facility] Available via Epic secure chat 7am-7pm After these hours, please refer to coverage provider listed on [email or web address] 04/05/2021, 5:49 PM
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
## 2021-04-06 ENCOUNTER — Inpatient Hospital Stay: Payer: Medicaid Other

## 2021-04-06 DIAGNOSIS — I5043 Acute on chronic combined systolic (congestive) and diastolic (congestive) heart failure: Secondary | ICD-10-CM

## 2021-04-06 DIAGNOSIS — I509 Heart failure, unspecified: Secondary | ICD-10-CM

## 2021-04-06 DIAGNOSIS — J810 Acute pulmonary edema: Secondary | ICD-10-CM

## 2021-04-06 DIAGNOSIS — E662 Morbid (severe) obesity with alveolar hypoventilation: Secondary | ICD-10-CM

## 2021-04-06 DIAGNOSIS — E785 Hyperlipidemia, unspecified: Secondary | ICD-10-CM

## 2021-04-06 DIAGNOSIS — J441 Chronic obstructive pulmonary disease with (acute) exacerbation: Secondary | ICD-10-CM

## 2021-04-06 LAB — BASIC METABOLIC PANEL
Anion gap: 8 (ref 5–15)
BUN: 23 mg/dL (ref 8–23)
CO2: 35 mmol/L — ABNORMAL HIGH (ref 22–32)
Calcium: 8.3 mg/dL — ABNORMAL LOW (ref 8.9–10.3)
Chloride: 97 mmol/L — ABNORMAL LOW (ref 98–111)
Creatinine, Ser: 1.17 mg/dL (ref 0.61–1.24)
GFR, Estimated: >60 mL/min (ref >60)
Glucose, Bld: 110 mg/dL — ABNORMAL HIGH (ref 70–99)
Potassium: 4.1 mmol/L (ref 3.5–5.1)
Sodium: 140 mmol/L (ref 135–145)

## 2021-04-06 LAB — TROPONIN I (HIGH SENSITIVITY)
Troponin I (High Sensitivity): 10 ng/L (ref <18)
Troponin I (High Sensitivity): 11 ng/L (ref <18)

## 2021-04-06 LAB — BLOOD GAS, VENOUS
Acid-Base Excess: 15 mmol/L — ABNORMAL HIGH (ref 0.0–2.0)
Bicarbonate: 41.5 mmol/L — ABNORMAL HIGH (ref 20.0–28.0)
O2 Saturation: 68.5 %
Patient temperature: 37
pCO2, Ven: 57 mmHg (ref 44.0–60.0)
pH, Ven: 7.47 — ABNORMAL HIGH (ref 7.250–7.430)
pO2, Ven: 33 mmHg (ref 32.0–45.0)

## 2021-04-06 MED ORDER — SENNOSIDES-DOCUSATE SODIUM 8.6-50 MG PO TABS
2.0000 | ORAL_TABLET | Freq: Two times a day (BID) | ORAL | Status: DC
  Administered 2021-04-06 – 2021-04-10 (×9): 2 via ORAL
  Filled 2021-04-06 (×10): qty 2

## 2021-04-06 MED ORDER — IPRATROPIUM-ALBUTEROL 0.5-2.5 (3) MG/3ML IN SOLN
3.0000 mL | Freq: Three times a day (TID) | RESPIRATORY_TRACT | Status: DC
  Administered 2021-04-06 – 2021-04-07 (×2): 3 mL via RESPIRATORY_TRACT
  Filled 2021-04-06 (×2): qty 3

## 2021-04-06 MED ORDER — NITROGLYCERIN 0.4 MG SL SUBL
SUBLINGUAL_TABLET | SUBLINGUAL | Status: AC
  Filled 2021-04-06: qty 1

## 2021-04-06 NOTE — Progress Notes (Signed)
Progress Note  Patient Name: Gary [name] Date of Encounter: 04/06/2021  [facility] Cardiologist: Ida Rogue, MD   Subjective   Feels somewhat better this morning, off BiPAP Long discussion concerning repeated hospital admissions for respiratory distress Reports he is seen by pulmonary at [facility] They have prescribed BiPAP, he has not received this yet " I needed it" Reports he does not feel well on the CPAP, only does well on the BiPAP Reports taking his Lasix daily, he has high water and other fluid intake Diet is poor, weight running very high  Inpatient Medications    Scheduled Meds:  aspirin EC  81 mg Oral Daily   budesonide  0.25 mg Nebulization BID   carvedilol  6.25 mg Oral Daily   Chlorhexidine Gluconate Cloth  6 each Topical Q0600   enoxaparin (LOVENOX) injection  0.5 mg/kg Subcutaneous Q24H   ferrous sulfate  325 mg Oral BID WC   fluticasone  2 spray Each Nare Daily   furosemide  40 mg Intravenous Q12H   ipratropium-albuterol  3 mL Nebulization Q6H   loratadine  10 mg Oral Daily   losartan  25 mg Oral Daily   pantoprazole  40 mg Oral Daily   potassium chloride  10 mEq Oral Daily   simvastatin  10 mg Oral q1800   sodium chloride flush  3 mL Intravenous Q12H   Continuous Infusions:  sodium chloride     PRN Meds: sodium chloride, acetaminophen, ALPRAZolam, ondansetron (ZOFRAN) IV, sodium chloride, sodium chloride flush   Vital Signs    Vitals:   04/06/21 0700 04/06/21 0800 04/06/21 0825 04/06/21 0906  BP: (!) 118/92 131/87 99/66   Pulse: 83 82 87   Resp: (!) 21 20    Temp:   98.4 F (36.9 C)   TempSrc:   Oral   SpO2: 92% 95% 90% 95%  Weight:      Height:        Intake/Output Summary (Last 24 hours) at 04/06/2021 0953 Last data filed at 04/06/2021 0700 Gross per 24 hour  Intake 240 ml  Output 2400 ml  Net -2160 ml   Last 3 Weights 04/06/2021 04/05/2021 04/04/2021  Weight (lbs) 416 lb 0.1 oz 412 lb 4.2 oz 412 lb 7.7 oz  Weight  (kg) 188.7 kg 187 kg 187.1 kg      Telemetry    Normal sinus rhythm- Personally Reviewed  ECG     - Personally Reviewed  Physical Exam   GEN: No acute distress.   Neck: Unable to estimate JVD in the setting of morbid obesity Cardiac: RRR, no murmurs, rubs, or gallops.  Respiratory: Clear to auscultation bilaterally.  Scattered Rales, poor inspiratory effort GI: Obese, soft, nontender, non-distended  MS: trace edema; No deformity. Neuro:  Nonfocal  Psych: Normal affect   Labs    High Sensitivity Troponin:   Recent Labs  Lab 04/04/21 1539 04/04/21 1725 04/05/21 0446 04/06/21 0435  TROPONINIHS 8 10 11 10      Chemistry Recent Labs  Lab 04/04/21 1725 04/04/21 2021 04/05/21 0950 04/06/21 0435  NA 145  --  144 140  K 4.5  --  4.8 4.1  CL 100  --  99 97*  CO2 36*  --  40* 35*  GLUCOSE 82  --  73 110*  BUN 16  --  19 23  CREATININE 0.92 0.76 1.02 1.17  CALCIUM 9.3  --  8.5* 8.3*  PROT 9.1*  --   --   --  ALBUMIN 4.1  --   --   --   AST 16  --   --   --   ALT 19  --   --   --   ALKPHOS 119  --   --   --   BILITOT 0.8  --   --   --   GFRNONAA >60 >60 >60 >60  ANIONGAP 9  --  5 8    Lipids No results for input(s): CHOL, TRIG, HDL, LABVLDL, LDLCALC, CHOLHDL in the last 168 hours.  Hematology Recent Labs  Lab 04/04/21 1539 04/04/21 2021  WBC 7.8 8.8  RBC 6.20* 5.79  HGB 14.6 13.7  HCT 56.1* 53.2*  MCV 90.5 91.9  MCH 23.5* 23.7*  MCHC 26.0* 25.8*  RDW 20.1* 19.9*  PLT 158 177   Thyroid No results for input(s): TSH, FREET4 in the last 168 hours.  BNP Recent Labs  Lab 04/04/21 1725  BNP 239.7*    DDimer No results for input(s): DDIMER in the last 168 hours.   Radiology    DG Chest Portable 1 View  Result Date: 04/04/2021 CLINICAL DATA:  Shortness of breath EXAM: PORTABLE CHEST 1 VIEW COMPARISON:  03/17/2021 FINDINGS: Cardiac shadow is enlarged. The overall inspiratory effort is poor. Prominent central vascularity is noted without significant  edema. These changes are stable from the prior exam. Bibasilar atelectatic changes are noted new from the prior study. No bony abnormality is seen. IMPRESSION: Changes of mild CHF with new bibasilar atelectasis. Electronically Signed   By: [name] Catalina M.D.   On: 04/04/2021 16:22    Cardiac Studies   Echo  1. Left ventricular ejection fraction, by estimation, is 60 to 65%. The  left ventricle has normal function. The left ventricle has no regional  wall motion abnormalities. Left ventricular diastolic parameters are  consistent with Grade III diastolic  dysfunction (restrictive).   2. Right ventricular systolic function is normal. The right ventricular  size is normal.   3. Left atrial size was mildly dilated.   4. The mitral valve is normal in structure. Trivial mitral valve  regurgitation. No evidence of mitral stenosis.   5. The aortic valve is normal in structure. Aortic valve regurgitation is  not visualized. No aortic stenosis is present.   6. The inferior vena cava is normal in size with greater than 50%  respiratory variability, suggesting right atrial pressure of 3 mmHg.   Patient Profile     Gary [name] is a 65 y.o. male with a history of HFpEF, hypertension, morbid obesity, sleep apnea, tobacco abuse, recent COVID-19 infection with respiratory failure, COPD, gastric ulcers, and prior GI bleeds, who is being seen today for the evaluation of acute on chronic diastolic heart failure  Assessment & Plan    Acute on chronic respiratory failure Morbid obesity, obesity hypoventilation syndrome Managed by pulmonary, BiPAP recently ordered but he has not received this Has recurrent episodes of respiratory distress if using CPAP --Some fluid and dietary indiscretion leading to pulmonary edema -Ejection fraction 60 to 65% -Case discussed with pulmonary, BiPAP is on order,  ordered with [place] they are processing order    Pulmonary edema Does not appear grossly fluid  overloaded With that said would continue IV Lasix twice daily possibly 1 more day and transition to oral Lasix possibly tomorrow No further ischemic work-up needed     Morbid obesity/obesity hypoventilation syndrome Followed by pulmonary, BiPAP being arranged Strong recommendation for dietary changes for weight loss  CHF education started, recommended low-salt, low carbohydrate, fluid restriction Discussed with pulmonary, BiPAP on order  Total encounter time more than 35 minutes  Greater than 50% was spent in counseling and coordination of care with the patient    For questions or updates, please contact [place] Please consult [email or web address] for contact info under        Signed, Ida Rogue, MD  04/06/2021, 9:53 AM

## 2021-04-06 NOTE — Evaluation (Signed)
Occupational Therapy Evaluation Patient Details Name: Gary [name] MRN: [id number] DOB: [birth year] Today's Date: 04/06/2021   History of Present Illness Gary [name] is a 65 year old male known to this service admitted with decompensated congestive heart failure exacerbation with acute on chronic hypoxic respiratory failure. PMH significant for chronic diastolic congestive heart failure, COPD, essential hypertension, PVD, obstructive sleep apnea, recent COVID-19 viral pneumonia who presents to [facility] ED on 12/20 via EMS for shortness of breath.  On EMS arrival patient was noted to be hypoxic with SPO2's in the 50s on 2 L nasal cannula and was placed on CPAP.   Recent admission for COVID-19 viral pneumonia with respiratory failure and ARDS requiring intubation on 02/21/2021 and transferred to [facility] for further management.  Patient was subsequently extubated on 02/26/2021 with gradual improvement and transferred back to [facility] on 03/11/2021.  Patient was discharged home on 03/21/2021.   Clinical Impression   Chart reviewed, RN cleared pt for participation in OT evaluation. Pt is known to this service from pervious hospitalizations. Pt is at edge of bed when therapist entered room, no socks donned. Pt educated on fall prevention techniques, required MAX A for LB dressing to donn no slip socks. Pt O2 on 3L [place] noted to be 84%, RN notified. Pt up to 6 L O2 with pt at 85-85%. Pt assisted back to bed with MOD-MAX A, SPO2 at 90%, RN present. Pt endorses SOB, educated on energy conservation techniques. RN deferred further mobility at this time.Pt is pefroming below PLOF, reports he was not mobilizing frequently at home following previous discharge. He would benefit from discharge to STR to address functional deficits, facilitate safe ADL completion. Pt is left in care of RN, all needs met. OT will continue to follow.      Recommendations for follow up therapy are one component of a  multi-disciplinary discharge planning process, led by the attending physician.  Recommendations may be updated based on patient status, additional functional criteria and insurance authorization.   Follow Up Recommendations  Skilled nursing-short term rehab (<3 hours/day)    Assistance Recommended at Discharge Frequent or constant Supervision/Assistance  Functional Status Assessment  Patient has had a recent decline in their functional status and demonstrates the ability to make significant improvements in function in a reasonable and predictable amount of time.  Equipment Recommendations   (per next venue of care; per chart review pt should have recommended equipment at home [facility], hospital bed, lift) following previous admission)    Recommendations for Other Services       Precautions / Restrictions Precautions Precautions: Fall      Mobility Bed Mobility Overal bed mobility: Needs Assistance Bed Mobility: Sit to Supine       Sit to supine: Mod assist;Max assist   General bed mobility comments: seated on EOB when therapist entered room    Transfers                   General transfer comment: deferred on this date due to low spo2, per RN request      Balance Overall balance assessment: Needs assistance Sitting-balance support: Feet supported Sitting balance-[name] Scale: Good                                     ADL either performed or assessed with clinical judgement   ADL Overall ADL's : Needs assistance/impaired  General ADL Comments: Pt at edge of bed feeding self with SET UP prior to this therapist entering room; MAX A for donning no slip socks, pt noted with 84% SPO2 so assisted back to bed with MOD-MAX A for management of LLE;     Vision Patient Visual Report: No change from baseline       Perception     Praxis      Pertinent Vitals/Pain Pain Assessment: Faces Faces Pain  Scale: Hurts a little bit Facial Expression: Relaxed, neutral Body Movements: Absence of movements Muscle Tension: Relaxed Compliance with ventilator (intubated pts.): N/A Vocalization (extubated pts.): Talking in normal tone or no sound CPOT Total: 0 Pain Location: generalized Pain Descriptors / Indicators: Discomfort Pain Intervention(s): Limited activity within patient's tolerance;Monitored during session     Hand Dominance     Extremity/Trunk Assessment Upper Extremity Assessment Upper Extremity Assessment: Generalized weakness   Lower Extremity Assessment Lower Extremity Assessment: Generalized weakness       Communication Communication Communication: No difficulties   Cognition Arousal/Alertness: Awake/alert Behavior During Therapy: WFL for tasks assessed/performed Overall Cognitive Status: Within Functional Limits for tasks assessed                                 General Comments: alert and oriented, resistant to education and requires cueing for participation     General Comments  Pt with 84% on 3L O2 seated at edge of bed prior to this therapist entering room. 85-87% on 6L seated at edge of bed, up to 90% when back to bed. RN deferred further mobility at this time.    Exercises Other Exercises Other Exercises: edu re: safety with bed mobility, safe ADL completion, role of rehab; will benefit from further education   Shoulder Instructions      Home Living Family/patient expects to be discharged to:: Private residence Living Arrangements: Spouse/significant other Available Help at Discharge: Family Type of Home: Apartment Home Access: Stairs to enter Technical brewer of Steps: 1 Entrance Stairs-Rails: None Home Layout: One level     Bathroom Shower/Tub: Tub/shower unit         Home Equipment: Conservation officer, nature (2 wheels);Cane - single point;BSC/3in1 (patient care lift)          Prior Functioning/Environment Prior Level of  Function : Needs assist       Physical Assist : ADLs (physical)   ADLs (physical): Grooming;Bathing;Dressing;Toileting;IADLs   ADLs Comments: Prior to acute hospitalization pt required assit in all ADL/IADL due to previous hospitzliations; prior to that he was I in ADL, assist with IADL as needed        OT Problem List: Decreased strength;Decreased activity tolerance;Obesity;Cardiopulmonary status limiting activity      OT Treatment/Interventions: Self-care/ADL training;Therapeutic exercise;DME and/or AE instruction;Therapeutic activities    OT Goals(Current goals can be found in the care plan section) Acute Rehab OT Goals Patient Stated Goal: to go to rehab OT Goal Formulation: With patient Time For Goal Achievement: 04/20/21 Potential to Achieve Goals: Good ADL Goals Pt Will Perform Grooming: with min guard assist;standing Pt Will Perform Upper Body Dressing: with min guard assist;sitting Pt Will Perform Lower Body Dressing: with min guard assist;sit to/from stand Pt Will Transfer to Toilet: ambulating;with min guard assist Pt Will Perform Toileting - Clothing Manipulation and hygiene: sit to/from stand;with min guard assist  OT Frequency: Min 2X/week   Barriers to D/C:  (physical assist required for ADL/IADL completion)  Co-evaluation              AM-PAC OT "6 Clicks" Daily Activity     Outcome Measure Help from another person eating meals?: None Help from another person taking care of personal grooming?: A Little Help from another person toileting, which includes using toliet, bedpan, or urinal?: A Lot Help from another person bathing (including washing, rinsing, drying)?: A Lot Help from another person to put on and taking off regular upper body clothing?: A Lot Help from another person to put on and taking off regular lower body clothing?: Total 6 Click Score: 14   End of Session Nurse Communication: Mobility status;Other (comment) (vital  signs)  Activity Tolerance: Treatment limited secondary to medical complications (Comment) Patient left: in bed;with call bell/phone within reach;with bed alarm set  OT Visit Diagnosis: Unsteadiness on feet (R26.81);Muscle weakness (generalized) (M62.81)                Time: 6520-7619 OT Time Calculation (min): 24 min Charges:  OT General Charges $OT Visit: 1 Visit OT Evaluation $OT Eval Moderate Complexity: 1 Mod  Shanon Payor, OTD OTR/L  04/06/21, 11:48 AM

## 2021-04-06 NOTE — TOC CM/SW Note (Signed)
Patient has NIV at home. [employer] rep reports noncompliance but may be that this machine is not a good option for him. [employer] rep is working with Dr. Lanney Gins to get bipap machine.   Gary [name], [place]

## 2021-04-06 NOTE — Progress Notes (Signed)
PT Cancellation Note  Patient Details Name: Gary [name] MRN: [id number] DOB: 04-Jun-1955   Cancelled Treatment:    Reason Eval/Treat Not Completed: Other (comment). Upon arrival, pt very SOB despite O2 sats in 90s on 6L of O2. RN and OT in room. Per RN, pt with volume overload and request therapist to return another day.    [name] 04/06/2021, 10:47 AM Greggory Stallion, PT, DPT [phone]

## 2021-04-06 NOTE — Progress Notes (Signed)
PROGRESS NOTE    Gary [name]  NLG:[id number] DOB: Sep 07, 1955 DOA: 04/04/2021 PCP: [facility] [place]    Brief Narrative:  Gary [name] is a 65 year old male with past medical history significant for chronic diastolic congestive heart failure, COPD, essential hypertension, PVD, obstructive sleep apnea, recent COVID-19 viral pneumonia who presents to [facility] ED on 12/20 via EMS for shortness of breath.  On EMS arrival patient was noted to be hypoxic with SPO2's in the 50s on 2 L nasal cannula and was placed on CPAP.  Recent admission for COVID-19 viral pneumonia with respiratory failure and ARDS requiring intubation on 02/21/2021 and transferred to [facility] for further management.  Patient was subsequently extubated on 02/26/2021 with gradual improvement and transferred back to [facility] on 03/11/2021.  Patient was discharge to home with home health on 03/21/2021.  In the ED, temperature 98.1 F, HR 78, BP 124/71, respiratory rate 27, SPO2 76% on CPAP which was subsequently transitioned to BiPAP.  Sodium 145, potassium 4.5, chloride 100, CO2 36, glucose 82, BUN 16, creatinine 0.92.  BNP 239.7, high sensitive troponin 8>10>11; within normal limits.  WBC 7.8, hemoglobin 14.6, platelets 158.  Covid-19 PCR negative peer influenza A/B PCR negative.  Chest x-ray with changes of mild CHF with new bibasilar atelectasis.  EKG with sinus rhythm, rate 80 with poor R wave progression.  EDP administered 60 mg IV Lasix and continued on BiPAP.  Hospital service consulted for further evaluation and management of decompensated congestive heart failure exacerbation with acute on chronic hypoxic respiratory failure.   Assessment & Plan:   Principal Problem:   Acute CHF (congestive heart failure) ([name]) Active Problems:   COPD (chronic obstructive pulmonary disease) ([name])   Acute on chronic respiratory failure ([name])   Hyperlipidemia   Acute on chronic hypoxic respiratory failure,  POA At baseline, patient on 2 L nasal cannula.  On EMS arrival was found to be oxygenating in the 50s on 2 L and subsequently placed on BiPAP.  Suspect etiology multifactorial in the setting of decompensated CHF and likely noncompliance with home CPAP/BiPAP. --Continue treatment as below  Chronic diastolic congestive heart failure, decompensated Patient presenting with progressive shortness of breath, found to be hypoxic with SPO2 in the 50s on his home 2 L nasal cannula.  Etiology likely secondary to decompensated CHF with elevated BNP and vascular congestion on chest x-ray. --Cardiology following, appreciate assistance --net negative 2.5L past 24h and net negatove 3.9L since admission --Furosemide 40 mg IV every 12 hours --Strict I's and O's Daily weights --BNP/BMP in a.m. --Repeat chest x-ray in a.m.  Hx OSA Per previous TOC notes, patient was due to receive a trilogy NIV ventilator. --VBG this a.m. with pH 7.47/257/PO2 33.0 --Continue BiPAP nightly and while napping --Titrate supplemental oxygen to maintain SPO2 greater than 88%  COPD on home oxygen 2-3 L nasal cannula --Pulmicort neb twice daily --duonebs --Titrate supplemental oxygen to maintain SPO2 greater than 88%; on 6 L nasal cannula this morning  Essential hypertension --Carvedilol 6.25 mg p.o. daily --Losartan 25 mg p.o. daily  Peripheral vascular disease --Continue aspirin and statin  Iron deficiency anemia --Ferrous sulfate 325 mg p.o. twice daily  GERD: Protonix 40 mg p.o. daily  Anxiety: Xanax 0.25 mg p.o. twice daily as needed  Hx of recent COVID-19 viral pneumonia  Patient with recent hospitalization with COVID-19 viral pneumonia and subsequent ARDS requiring ventilatory support.  COVID-19/influenza A/B PCR negative on this admission. --Continue supportive care as above, oxygen  Morbid obesity Body mass index is 52 kg/m.  Discussed with patient needs for aggressive lifestyle changes/weight loss as this  complicates all facets of care.  Outpatient follow-up with PCP.  May benefit from bariatric evaluation outpatient.  Weakness/debility/deconditioning: --OT recommending SNF --PT consult pending --We will consult TOC for evaluation as likely would benefit from SNF placement given his recurrent hospitalizations and physical decline  DVT prophylaxis:   Lovenox   Code Status: Full Code Family Communication: No family present at bedside this morning  Disposition Plan:  Level of care: Progressive Status is: Inpatient  Remains inpatient appropriate because: Continues on high O2 supplementation/BiPAP, on IV diuresis     Consultants:  Cardiology  Procedures:  None  Antimicrobials:  None   Subjective: Patient seen examined bedside, resting comfortably.  Patient feels weak and fatigued this morning.  Ox requirements have increased to 6 L nasal cannula.  Seen by OT with recommendation of SNF placement.  Per social work, patient does have trilogy NIV at home but is working with pulmonology, Dr. Lanney Gins possible BiPAP as a better option.  No family present.  Denies headache, no chest pain, no abdominal pain.  No other questions or concerns at this time.  No acute concerns overnight per nursing staff.  Objective: Vitals:   04/06/21 0800 04/06/21 0825 04/06/21 0906 04/06/21 1142  BP: 131/87 99/66  140/83  Pulse: 82 87  85  Resp: 20     Temp:  98.4 F (36.9 C)  98.2 F (36.8 C)  TempSrc:  Oral    SpO2: 95% 90% 95% 92%  Weight:      Height:        Intake/Output Summary (Last 24 hours) at 04/06/2021 1421 Last data filed at 04/06/2021 1350 Gross per 24 hour  Intake 840 ml  Output 2900 ml  Net -2060 ml   Filed Weights   04/04/21 2220 04/05/21 0457 04/06/21 0500  Weight: (!) 187.1 kg (!) 187 kg (!) 188.7 kg    Examination:  General exam: Appears calm and comfortable, morbidly obese Respiratory system: Decreased breath sounds bilateral bases, normal respiratory effort without  accessory muscle use, no wheezing, on 6 L nasal cannula Cardiovascular system: S1 & S2 heard, RRR. No JVD, murmurs, rubs, gallops or clicks. No pedal edema. Gastrointestinal system: Abdomen is nondistended, protuberant abdomen, soft and nontender. No organomegaly or masses felt. Normal bowel sounds heard. Central nervous system: Alert and oriented. No focal neurological deficits. Extremities: Symmetric 5 x 5 power. Skin: No rashes, lesions or ulcers Psychiatry: Judgement and insight appear poor. Mood & affect appropriate.     Data Reviewed: I have personally reviewed following labs and imaging studies  CBC: Recent Labs  Lab 04/04/21 1539 04/04/21 2021  WBC 7.8 8.8  HGB 14.6 13.7  HCT 56.1* 53.2*  MCV 90.5 91.9  PLT 158 130   Basic Metabolic Panel: Recent Labs  Lab 04/04/21 1725 04/04/21 2021 04/05/21 0950 04/06/21 0435  NA 145  --  144 140  K 4.5  --  4.8 4.1  CL 100  --  99 97*  CO2 36*  --  40* 35*  GLUCOSE 82  --  73 110*  BUN 16  --  19 23  CREATININE 0.92 0.76 1.02 1.17  CALCIUM 9.3  --  8.5* 8.3*   GFR: Estimated Creatinine Clearance: 115.4 mL/min (by C-G formula based on SCr of 1.17 mg/dL). Liver Function Tests: Recent Labs  Lab 04/04/21 1725  AST 16  ALT 19  ALKPHOS  119  BILITOT 0.8  PROT 9.1*  ALBUMIN 4.1   No results for input(s): LIPASE, AMYLASE in the last 168 hours. No results for input(s): AMMONIA in the last 168 hours. Coagulation Profile: No results for input(s): INR, PROTIME in the last 168 hours. Cardiac Enzymes: No results for input(s): CKTOTAL, CKMB, CKMBINDEX, TROPONINI in the last 168 hours. BNP (last 3 results) No results for input(s): PROBNP in the last 8760 hours. HbA1C: No results for input(s): HGBA1C in the last 72 hours. CBG: Recent Labs  Lab 04/04/21 2229 04/04/21 2340  GLUCAP 69* 93   Lipid Profile: No results for input(s): CHOL, HDL, LDLCALC, TRIG, CHOLHDL, LDLDIRECT in the last 72 hours. Thyroid Function Tests: No  results for input(s): TSH, T4TOTAL, FREET4, T3FREE, THYROIDAB in the last 72 hours. Anemia Panel: No results for input(s): VITAMINB12, FOLATE, FERRITIN, TIBC, IRON, RETICCTPCT in the last 72 hours. Sepsis Labs: No results for input(s): PROCALCITON, LATICACIDVEN in the last 168 hours.  Recent Results (from the past 240 hour(s))  Resp Panel by RT-PCR (Flu A&B, Covid) Nasopharyngeal Swab     Status: None   Collection Time: 04/04/21  3:39 PM   Specimen: Nasopharyngeal Swab; Nasopharyngeal(NP) swabs in vial transport medium  Result Value Ref Range Status   SARS Coronavirus 2 by RT PCR NEGATIVE NEGATIVE Final    Comment: (NOTE) SARS-CoV-2 target nucleic acids are NOT DETECTED.  The SARS-CoV-2 RNA is generally detectable in upper respiratory specimens during the acute phase of infection. The lowest concentration of SARS-CoV-2 viral copies this assay can detect is 138 copies/mL. A negative result does not preclude SARS-Cov-2 infection and should not be used as the sole basis for treatment or other patient management decisions. A negative result may occur with  improper specimen collection/handling, submission of specimen other than nasopharyngeal swab, presence of viral mutation(s) within the areas targeted by this assay, and inadequate number of viral copies(<138 copies/mL). A negative result must be combined with clinical observations, patient history, and epidemiological information. The expected result is Negative.  Fact Sheet for Patients:  EntrepreneurPulse.com.au  Fact Sheet for Healthcare Providers:  IncredibleEmployment.be  This test is no t yet approved or cleared by the [place] FDA and  has been authorized for detection and/or diagnosis of SARS-CoV-2 by FDA under an Emergency Use Authorization (EUA). This EUA will remain  in effect (meaning this test can be used) for the duration of the COVID-19 declaration under Section 564(b)(1) of  the Act, 21 U.S.C.section 360bbb-3(b)(1), unless the authorization is terminated  or revoked sooner.       Influenza A by PCR NEGATIVE NEGATIVE Final   Influenza B by PCR NEGATIVE NEGATIVE Final    Comment: (NOTE) The Xpert Xpress SARS-CoV-2/FLU/RSV plus assay is intended as an aid in the diagnosis of influenza from Nasopharyngeal swab specimens and should not be used as a sole basis for treatment. Nasal washings and aspirates are unacceptable for Xpert Xpress SARS-CoV-2/FLU/RSV testing.  Fact Sheet for Patients: EntrepreneurPulse.com.au  Fact Sheet for Healthcare Providers: IncredibleEmployment.be  This test is not yet approved or cleared by the [place] FDA and has been authorized for detection and/or diagnosis of SARS-CoV-2 by FDA under an Emergency Use Authorization (EUA). This EUA will remain in effect (meaning this test can be used) for the duration of the COVID-19 declaration under Section 564(b)(1) of the Act, 21 U.S.C. section 360bbb-3(b)(1), unless the authorization is terminated or revoked.  Performed at [facility], [address] [place], [place] [address]  MRSA Next Gen by PCR, Nasal     Status: None   Collection Time: 04/04/21 10:29 PM   Specimen: Nasal Mucosa; Nasal Swab  Result Value Ref Range Status   MRSA by PCR Next Gen NOT DETECTED NOT DETECTED Final    Comment: (NOTE) The GeneXpert MRSA Assay (FDA approved for NASAL specimens only), is one component of a comprehensive MRSA colonization surveillance program. It is not intended to diagnose MRSA infection nor to guide or monitor treatment for MRSA infections. Test performance is not FDA approved in patients less than 3 years old. Performed at [facility], [address], [place], [place] [address]          Radiology Studies: DG Chest Portable 1 View  Result Date: 04/04/2021 CLINICAL DATA:  Shortness of breath EXAM: PORTABLE  CHEST 1 VIEW COMPARISON:  03/17/2021 FINDINGS: Cardiac shadow is enlarged. The overall inspiratory effort is poor. Prominent central vascularity is noted without significant edema. These changes are stable from the prior exam. Bibasilar atelectatic changes are noted new from the prior study. No bony abnormality is seen. IMPRESSION: Changes of mild CHF with new bibasilar atelectasis. Electronically Signed   By: [name] Catalina M.D.   On: 04/04/2021 16:22        Scheduled Meds:  aspirin EC  81 mg Oral Daily   budesonide  0.25 mg Nebulization BID   carvedilol  6.25 mg Oral Daily   Chlorhexidine Gluconate Cloth  6 each Topical Q0600   enoxaparin (LOVENOX) injection  0.5 mg/kg Subcutaneous Q24H   ferrous sulfate  325 mg Oral BID WC   fluticasone  2 spray Each Nare Daily   furosemide  40 mg Intravenous Q12H   ipratropium-albuterol  3 mL Nebulization Q6H   loratadine  10 mg Oral Daily   losartan  25 mg Oral Daily   pantoprazole  40 mg Oral Daily   potassium chloride  10 mEq Oral Daily   senna-docusate  2 tablet Oral BID   simvastatin  10 mg Oral q1800   sodium chloride flush  3 mL Intravenous Q12H   Continuous Infusions:  sodium chloride       LOS: 2 days    Time spent: 42 minutes spent on chart review, discussion with nursing staff, consultants, updating family and interview/physical exam; more than 50% of that time was spent in counseling and/or coordination of care.    Archana Eckman J [place] DO [facility] Available via Epic secure chat 7am-7pm After these hours, please refer to coverage provider listed on [email or web address] 04/06/2021, 2:21 PM

## 2021-04-06 NOTE — Progress Notes (Signed)
Report given to [facility] for transfer to room 235. Morning medications given. Patient reports he had a bag of belongings on the floor in the room. He states an unknown woman with glasses came in to get the trash and he thinks she took it. He states there was a new shirt, new [employer] shoes, and new pants. Patient's cell phone placed in belongings bag along with snacks, placed at the foot of his bed for transport.

## 2021-04-06 NOTE — Consult Note (Signed)
° °  Heart  Failure Nurse Navigator Note  HFpEF EF 60 to 65%.  Grade 2 diastolic dysfunction.  He presented to the emergency room with complaints of worsening shortness of breath, dry cough and wheezing.  Comorbidities:  Morbid obesity Hypertension Hypoxia Sleep apnea on BiPAP COPD  Medications:  Aspirin 81 mg a day Carvedilol 6.25 mg daily Furosemide 40 mg every 12 hours Losartan 25 mg daily Potassium chloride 10 mEq daily Simvastatin 10 mg daily  Labs: Sodium 140, potassium 4.1, chloride 97, CO2 35, BUN 23, creatinine 1.17 Weight is 188.7 kg Blood pressure 140/83 Intake 240 mL Output 2700 mL  Initial meeting with patient on this admission.  Wife is not present.  He had last been discharged on December 6 to home.  He states that he did not receive his BiPAP.  He was not weighing himself as he said he was too weak and too short of breath to weigh himself.  Stressed that importance of daily weights and what to report to physician.  In discussing fluid restriction I asked him how much he felt that he was drinking daily.  He said he really could not put a number with that but when I asked him if he drank more than 64 ounces, he said now there is no way I drink more than 2 L a day.  Denied eating fast food/restaurant food and does not use salt at the table.  States he eats chicken, fish and [place].  Along with eating fruits and vegetables.  Discussed follow-up with Darylene Price in the outpatient heart failure clinic.  He has an appointment for January 9 at 3:30 in the afternoon.  He has a 24% rate of no-show which is 24 out of 99 appointments.  Pricilla Riffle RN [facility]

## 2021-04-07 ENCOUNTER — Inpatient Hospital Stay: Payer: Medicaid Other

## 2021-04-07 DIAGNOSIS — R0602 Shortness of breath: Secondary | ICD-10-CM

## 2021-04-07 LAB — BASIC METABOLIC PANEL
Anion gap: 8 (ref 5–15)
BUN: 22 mg/dL (ref 8–23)
CO2: 36 mmol/L — ABNORMAL HIGH (ref 22–32)
Calcium: 8.3 mg/dL — ABNORMAL LOW (ref 8.9–10.3)
Chloride: 96 mmol/L — ABNORMAL LOW (ref 98–111)
Creatinine, Ser: 1.16 mg/dL (ref 0.61–1.24)
GFR, Estimated: >60 mL/min (ref >60)
Glucose, Bld: 86 mg/dL (ref 70–99)
Potassium: 4 mmol/L (ref 3.5–5.1)
Sodium: 140 mmol/L (ref 135–145)

## 2021-04-07 LAB — BRAIN NATRIURETIC PEPTIDE: B Natriuretic Peptide: 31.7 pg/mL (ref 0.0–100.0)

## 2021-04-07 MED ORDER — IPRATROPIUM-ALBUTEROL 0.5-2.5 (3) MG/3ML IN SOLN
3.0000 mL | Freq: Two times a day (BID) | RESPIRATORY_TRACT | Status: DC
  Administered 2021-04-07 – 2021-04-11 (×8): 3 mL via RESPIRATORY_TRACT
  Filled 2021-04-07 (×8): qty 3

## 2021-04-07 NOTE — Progress Notes (Addendum)
Progress Note  Patient Name: Gary [name] Date of Encounter: 04/07/2021  [facility] Cardiologist: Ida Rogue, MD   Subjective   Case discussed with social work yesterday and company providing CPAP History of noncompliance with CPAP, Long discussion with him this morning he reports some noncompliance during periods of time he is in the hospital otherwise reports he is using his CPAP Currently on 5 to 6 L nasal cannula oxygen Discussed with nursing, they report desaturation when ambulating with PT and OT  Inpatient Medications    Scheduled Meds:  aspirin EC  81 mg Oral Daily   budesonide  0.25 mg Nebulization BID   carvedilol  6.25 mg Oral Daily   Chlorhexidine Gluconate Cloth  6 each Topical Q0600   enoxaparin (LOVENOX) injection  0.5 mg/kg Subcutaneous Q24H   ferrous sulfate  325 mg Oral BID WC   fluticasone  2 spray Each Nare Daily   furosemide  40 mg Intravenous Q12H   ipratropium-albuterol  3 mL Nebulization BID   loratadine  10 mg Oral Daily   losartan  25 mg Oral Daily   pantoprazole  40 mg Oral Daily   potassium chloride  10 mEq Oral Daily   senna-docusate  2 tablet Oral BID   simvastatin  10 mg Oral q1800   sodium chloride flush  3 mL Intravenous Q12H   Continuous Infusions:  sodium chloride     PRN Meds: sodium chloride, acetaminophen, ALPRAZolam, ondansetron (ZOFRAN) IV, sodium chloride, sodium chloride flush   Vital Signs    Vitals:   04/06/21 2341 04/07/21 0346 04/07/21 0751 04/07/21 1147  BP: 104/76 132/65 121/82 110/78  Pulse: 84 83 83 78  Resp: 17  19 20   Temp: 98.3 F (36.8 C) 98.3 F (36.8 C) 99.1 F (37.3 C) 98.1 F (36.7 C)  TempSrc: Oral Oral Oral   SpO2: 95% 97% 95% 93%  Weight:  (!) 184.6 kg    Height:        Intake/Output Summary (Last 24 hours) at 04/07/2021 1412 Last data filed at 04/07/2021 1148 Gross per 24 hour  Intake 960 ml  Output 1300 ml  Net -340 ml   Last 3 Weights 04/07/2021 04/06/2021 04/05/2021   Weight (lbs) 406 lb 15.5 oz 416 lb 0.1 oz 412 lb 4.2 oz  Weight (kg) 184.6 kg 188.7 kg 187 kg      Telemetry    Normal sinus rhythm- Personally Reviewed  ECG     - Personally Reviewed  Physical Exam   Constitutional:  oriented to person, place, and time. No distress.  Morbidly obese HENT:  Head: Grossly normal Eyes:  no discharge. No scleral icterus.  Neck: No JVD, no carotid bruits  Cardiovascular: Regular rate and rhythm, no murmurs appreciated Pulmonary/Chest: Clear to auscultation bilaterally, no wheezes or rails Abdominal: Soft.  no distension.  no tenderness.  Musculoskeletal: Normal range of motion Neurological:  normal muscle tone. Coordination normal. No atrophy Skin: Skin warm and dry Psychiatric: normal affect, pleasant  Labs    High Sensitivity Troponin:   Recent Labs  Lab 04/04/21 1539 04/04/21 1725 04/05/21 0446 04/06/21 0435 04/06/21 1752  TROPONINIHS 8 10 11 10 11      Chemistry Recent Labs  Lab 04/04/21 1725 04/04/21 2021 04/05/21 0950 04/06/21 0435 04/07/21 0339  NA 145  --  144 140 140  K 4.5  --  4.8 4.1 4.0  CL 100  --  99 97* 96*  CO2 36*  --  40* 35* 36*  GLUCOSE 82  --  73 110* 86  BUN 16  --  19 23 22   CREATININE 0.92   < > 1.02 1.17 1.16  CALCIUM 9.3  --  8.5* 8.3* 8.3*  PROT 9.1*  --   --   --   --   ALBUMIN 4.1  --   --   --   --   AST 16  --   --   --   --   ALT 19  --   --   --   --   ALKPHOS 119  --   --   --   --   BILITOT 0.8  --   --   --   --   GFRNONAA >60   < > >60 >60 >60  ANIONGAP 9  --  5 8 8    < > = values in this interval not displayed.    Lipids No results for input(s): CHOL, TRIG, HDL, LABVLDL, LDLCALC, CHOLHDL in the last 168 hours.  Hematology Recent Labs  Lab 04/04/21 1539 04/04/21 2021  WBC 7.8 8.8  RBC 6.20* 5.79  HGB 14.6 13.7  HCT 56.1* 53.2*  MCV 90.5 91.9  MCH 23.5* 23.7*  MCHC 26.0* 25.8*  RDW 20.1* 19.9*  PLT 158 177   Thyroid No results for input(s): TSH, FREET4 in the last 168  hours.  BNP Recent Labs  Lab 04/04/21 1725 04/07/21 0339  BNP 239.7* 31.7    DDimer No results for input(s): DDIMER in the last 168 hours.   Radiology    DG Chest Port 1 View  Result Date: 04/07/2021 CLINICAL DATA:  Shortness of breath. EXAM: PORTABLE CHEST 1 VIEW COMPARISON:  Chest x-ray from yesterday. FINDINGS: The patient is rotated to the right. Stable cardiomegaly. Unchanged pulmonary vascular congestion and mild interstitial thickening. New linear atelectasis in the right mid lung. No focal consolidation, pleural effusion, or pneumothorax. No acute osseous abnormality. IMPRESSION: 1. Unchanged mild congestive heart failure. Electronically Signed   By: Titus Dubin M.D.   On: 04/07/2021 08:07   DG Chest Port 1 View  Result Date: 04/06/2021 CLINICAL DATA:  Shortness of breath with chest pain EXAM: PORTABLE CHEST 1 VIEW COMPARISON:  04/04/2021 FINDINGS: Cardiac shadow is enlarged but stable. Vascular congestion is again identified. No focal confluent infiltrate or sizable effusion is seen. The previously seen bibasilar atelectasis has cleared. No bony abnormality is noted. IMPRESSION: Stable mild CHF. Interval clearing of bibasilar atelectasis. Electronically Signed   By: [name] Catalina M.D.   On: 04/06/2021 18:12    Cardiac Studies   Echo  1. Left ventricular ejection fraction, by estimation, is 60 to 65%. The  left ventricle has normal function. The left ventricle has no regional  wall motion abnormalities. Left ventricular diastolic parameters are  consistent with Grade III diastolic  dysfunction (restrictive).   2. Right ventricular systolic function is normal. The right ventricular  size is normal.   3. Left atrial size was mildly dilated.   4. The mitral valve is normal in structure. Trivial mitral valve  regurgitation. No evidence of mitral stenosis.   5. The aortic valve is normal in structure. Aortic valve regurgitation is  not visualized. No aortic stenosis is  present.   6. The inferior vena cava is normal in size with greater than 50%  respiratory variability, suggesting right atrial pressure of 3 mmHg.   Patient Profile     Gary [name] is a 65 y.o. male with a  history of HFpEF, hypertension, morbid obesity, sleep apnea, tobacco abuse, recent COVID-19 infection with respiratory failure, COPD, gastric ulcers, and prior GI bleeds, who is being seen today for the evaluation of acute on chronic diastolic heart failure  Assessment & Plan    Acute on chronic respiratory failure with hypoxia and hypercapnia Morbid obesity, obesity hypoventilation syndrome Pulmonary edema and COPD Working with pulmonary, some noncompliance with his CPAP, working on getting BiPAP, managed by [place] === In terms of his heart failure we have recommended fluid restriction, compliance with his medications -Ejection fraction 60 to 65% BNP now low, renal function stable ----Given hypoxia on working with PT and OT, would continue Lasix as detailed below   Pulmonary edema Negative 4 liters this admission BNP now in the normal range Given normal renal function, continued hypoxia on getting out of bed with saturations in the 80s,, consider IV diuresis 1 -2 more additional days with reassessment daily monitoring of renal function Stressed the importance of  low fluid and salt compliance, wearing his CPAP.  Working on getting him BiPAP through pulmonary --- High risk of readmission   Morbid obesity/obesity hypoventilation syndrome Followed by pulmonary, BiPAP being arranged We have encouraged continued exercise, careful diet management in an effort to lose weight.  Long discussion with him concerning need to be compliant with CPAP, restrict fluids and salt.  Also discussed weight loss strategies   Total encounter time more than 35 minutes  Greater than 50% was spent in counseling and coordination of care with the patient  For questions or updates, please contact  [place] Please consult [email or web address] for contact info under        Signed, Ida Rogue, MD  04/07/2021, 2:12 PM

## 2021-04-07 NOTE — Plan of Care (Signed)

## 2021-04-07 NOTE — Plan of Care (Signed)
  Problem: Clinical Measurements: Goal: Ability to maintain clinical measurements within normal limits will improve Outcome: Progressing   Problem: Clinical Measurements: Goal: Will remain free from infection Outcome: Progressing   Problem: Clinical Measurements: Goal: Diagnostic test results will improve Outcome: Progressing   

## 2021-04-07 NOTE — TOC Initial Note (Addendum)
Transition of Care [facility]) - Initial/Assessment Note    Patient Details  Name: Gary [name] MRN: [id number] Date of Birth: 06-23-1955  [facility]) CM/SW Contact:    Candie Chroman, LCSW Phone Number: 04/07/2021, 1:40 PM  Clinical Narrative:  Went by room to complete readmission prevention screen and discuss PT recommendations but patient was on the phone and asking for the RN. Will come back as time allows.  Readmission prevention screen was completed last admission on 11/27:   "Patient lives with his wife and usually drives himself to appointments. PCP and Pharmacy is [facility].  Patient went to a SNF in [place] in the past.  Patient has a RW, cane, and home o2 through [employer].  Patient said he has had 2 COVID vaccines and 2 boosters."  Patient decided to go home at discharge rather than going to SNF but TOC was unable to secure home health services. They did arrange for hospital bed, hoyer lift, bedside commode, and wheelchair.          [employer] representative spoke with Dr. Lanney Gins this morning. No plans to get patient a bipap machine. Plan is to continue his home NIV. [employer] rep said Dr. Lanney Gins will reinforce this with patient.         Expected Discharge Plan: [place] Barriers to Discharge: Continued Medical Work up   Patient Goals and CMS Choice        Expected Discharge Plan and Services Expected Discharge Plan: [place] Acute Care Choice:  (TBD) Living arrangements for the past 2 months: Single Family Home                                      Prior Living Arrangements/Services Living arrangements for the past 2 months: Single Family Home Lives with:: Significant Other Patient language and need for interpreter reviewed:: Yes        Need for Family Participation in Patient Care: Yes (Comment) Care giver support system in place?: Yes (comment) Current home services: DME Criminal  Activity/Legal Involvement Pertinent to Current Situation/Hospitalization: No - Comment as needed  Activities of Daily Living      Permission Sought/Granted                  Emotional Assessment Appearance:: Appears stated age     Orientation: : Oriented to Self, Oriented to Place, Oriented to  Time, Oriented to Situation Alcohol / Substance Use: Not Applicable Psych Involvement: No (comment)  Admission diagnosis:  Hypoxia [R09.02] Acute CHF (congestive heart failure) ([name]) [I50.9] Acute respiratory failure with hypoxia ([name]) [J96.01] Acute on chronic congestive heart failure, unspecified heart failure type ([name]) [I50.9] Patient Active Problem List   Diagnosis Date Noted   Acute CHF (congestive heart failure) ([place]) 04/04/2021   Weakness    Pneumonia 03/11/2021   Acute respiratory failure with hypoxia and hypercapnia ([place]) 03/11/2021   (HFpEF) heart failure with preserved ejection fraction ([name])    Chronic respiratory failure with hypoxia ([place])    Hyperlipidemia 02/19/2021   Acute respiratory distress syndrome (ARDS) due to COVID-19 virus ([place])    Acute on chronic respiratory failure ([place]) 02/18/2021   Spondylosis without myelopathy or radiculopathy, lumbosacral region 11/15/2020   Lumbosacral facet joint syndrome (Bilateral) 10/31/2020   Generalized osteoarthritis of multiple sites 10/31/2020   AKI (acute kidney injury) ([place])  03/04/2020   Hyperkalemia 03/04/2020   COPD (chronic obstructive pulmonary disease) ([place]) 03/02/2020   Marijuana use 01/17/2020   Elevated BUN 01/17/2020   Hypocalcemia 01/17/2020   Hypochloremia 01/17/2020   Low thyroid stimulating hormone (TSH) level 01/17/2020   Vitamin D deficiency 01/17/2020   Elevated sed rate 01/17/2020   Thrombocytopenia ([name]) 01/17/2020   Elevated hematocrit 01/17/2020   Elevated brain natriuretic peptide (BNP) level 01/17/2020   Positive hepatitis C antibody test 01/17/2020   Class 3 obesity with alveolar  hypoventilation and body mass index (BMI) of 50.0 to 59.9 in adult [facility]) 01/17/2020   Osteoarthritis of glenohumeral joints (Bilateral) 01/17/2020   Osteoarthritis of AC (acromioclavicular) joints (Bilateral) 01/17/2020   Lumbar Grade 1 Anterolisthesis of L4 over L5 (3 mm) (stable) 01/17/2020   Osteoarthritis of lumbar spine (L3 through S1) 01/17/2020   Lumbar facet osteoarthritis (Multilevel) (Bilateral) 01/17/2020   Osteoarthritis of hips (Bilateral) 01/17/2020   Osteoarthritis of knees (Bilateral) 01/17/2020   Tricompartment osteoarthritis of knees (Bilateral) 01/17/2020   Patellofemoral arthralgia of knees (Bilateral) 01/17/2020   Plantar and Achilles calcaneal spurs (Bilateral) 01/17/2020   Ankle edema (Bilateral) 01/17/2020   Traumatic osteoarthritis of hands (Bilateral) 01/17/2020   Polysubstance abuse ([place]) 08/25/2019   Chronic pain syndrome 07/14/2019   Pharmacologic therapy 07/14/2019   Disorder of skeletal system 07/14/2019   Problems influencing health status 07/14/2019   Chronic low back pain (1ry area of Pain) (Bilateral) (L>R) w/o sciatica 07/14/2019   Chronic shoulder pain (2ry area of Pain) (Bilateral) (L>R) 07/14/2019   Chronic feet pain (3ry area of Pain) (Bilateral) 07/14/2019   Chronic knee pain (4th area of Pain) (Bilateral) (L>R) 07/14/2019   Chronic hip pain (5th area of Pain) (Bilateral) (L>R) 07/14/2019   Chronic hand pain (6th area of Pain) (Bilateral) (L>R) 07/14/2019   Lumbar facet arthropathy 07/14/2019   DDD (degenerative disc disease), lumbosacral 07/14/2019   Closed fracture of shaft of humerus, sequela (Left) 07/14/2019   DDD (degenerative disc disease), cervical 07/14/2019   Tobacco use 05/04/2017   Lymphedema 05/04/2017   CHF (congestive heart failure) ([place]) 04/12/2017   Acute on chronic diastolic (congestive) heart failure ([place]) 02/28/2015   Obstructive sleep apnea 02/28/2015   Edema of both feet    Closed fracture of scapula, sequela (Left)  04/01/2014   Morbid obesity with BMI of 50.0-59.9, adult ([place]) 04/01/2014   HTN (hypertension) 04/01/2014   Multiple rib fractures 03/27/2014   PCP:  [facility] [place]:   [place] [address] [place] [address] Phone: [phone] Fax: [phone]     Social Determinants of Health (SDOH) Interventions    Readmission Risk Interventions Readmission Risk Prevention Plan 04/07/2021 03/12/2021 03/01/2020  Transportation Screening Complete Complete Complete  PCP or Specialist Appt within 3-5 Days Complete Complete Not Complete  Not Complete comments - - To be scheduled by unit secretary  [place] or [place] - Complete Complete  Social Work Consult for [place] Planning/Counseling - Complete Complete  Palliative Care Screening Not Applicable Not Applicable Not Applicable  Medication Review Press photographer) Complete Complete Complete  Some recent data might be hidden

## 2021-04-07 NOTE — Progress Notes (Signed)
PROGRESS NOTE    Gary [name]  TML:[id number] DOB: 31-Jul-1955 DOA: 04/04/2021 PCP: [facility] [place]    Brief Narrative:  Gary [name] is a 65 year old male with past medical history significant for chronic diastolic congestive heart failure, COPD, essential hypertension, PVD, obstructive sleep apnea, recent COVID-19 viral pneumonia who presents to [facility] ED on 12/20 via EMS for shortness of breath.  On EMS arrival patient was noted to be hypoxic with SPO2's in the 50s on 2 L nasal cannula and was placed on CPAP.  Recent admission for COVID-19 viral pneumonia with respiratory failure and ARDS requiring intubation on 02/21/2021 and transferred to [facility] for further management.  Patient was subsequently extubated on 02/26/2021 with gradual improvement and transferred back to [facility] on 03/11/2021.  Patient was discharge to home with home health on 03/21/2021.  In the ED, temperature 98.1 F, HR 78, BP 124/71, respiratory rate 27, SPO2 76% on CPAP which was subsequently transitioned to BiPAP.  Sodium 145, potassium 4.5, chloride 100, CO2 36, glucose 82, BUN 16, creatinine 0.92.  BNP 239.7, high sensitive troponin 8>10>11; within normal limits.  WBC 7.8, hemoglobin 14.6, platelets 158.  Covid-19 PCR negative peer influenza A/B PCR negative.  Chest x-ray with changes of mild CHF with new bibasilar atelectasis.  EKG with sinus rhythm, rate 80 with poor R wave progression.  EDP administered 60 mg IV Lasix and continued on BiPAP.  Hospital service consulted for further evaluation and management of decompensated congestive heart failure exacerbation with acute on chronic hypoxic respiratory failure.   Assessment & Plan:   Principal Problem:   Acute CHF (congestive heart failure) ([name]) Active Problems:   COPD (chronic obstructive pulmonary disease) ([name])   Acute on chronic respiratory failure ([name])   Hyperlipidemia   Acute on chronic hypoxic respiratory failure,  POA At baseline, patient on 2 L nasal cannula.  On EMS arrival was found to be oxygenating in the 50s on 2 L and subsequently placed on BiPAP.  Suspect etiology multifactorial in the setting of decompensated CHF and likely noncompliance with home CPAP/BiPAP. --Continue treatment as below  Chronic diastolic congestive heart failure, decompensated Patient presenting with progressive shortness of breath, found to be hypoxic with SPO2 in the 50s on his home 2 L nasal cannula.  Etiology likely secondary to decompensated CHF with elevated BNP and vascular congestion on chest x-ray. --Cardiology following, appreciate assistance --net negative 911mL past 24h and net negatove 4.9L since admission --Furosemide 40 mg IV every 12 hours --Strict I's and O's Daily weights --/BMP in a.m.  Hx OSA Per previous TOC notes, patient was due to receive a trilogy NIV ventilator; but patient reports only has a CPAP machine at home.  Patient's pulmonologist, Dr. Lanney Gins working on obtaining a home BiPAP machine through [employer]. --VBG this a.m. with pH 7.47/257/PO2 33.0 --Continue BiPAP nightly and while napping --Titrate supplemental oxygen to maintain SPO2 greater than 88%, on 5 L nasal cannula with SPO2 97% at rest this morning  COPD on home oxygen 2-3 L nasal cannula --Pulmicort neb twice daily --duonebs --Titrate supplemental oxygen to maintain SPO2 greater than 88%; on 5 L nasal cannula this morning  Essential hypertension --Carvedilol 6.25 mg p.o. daily --Losartan 25 mg p.o. daily  Peripheral vascular disease --Continue aspirin and statin  Iron deficiency anemia --Ferrous sulfate 325 mg p.o. twice daily  GERD: Protonix 40 mg p.o. daily  Anxiety: Xanax 0.25 mg p.o. twice daily as needed  Hx of recent COVID-19 viral  pneumonia  Patient with recent hospitalization with COVID-19 viral pneumonia and subsequent ARDS requiring ventilatory support.  COVID-19/influenza A/B PCR negative on this  admission. --Continue supportive care as above, oxygen  Morbid obesity Body mass index is 50.87 kg/m.  Discussed with patient needs for aggressive lifestyle changes/weight loss as this complicates all facets of care.  Outpatient follow-up with PCP.  May benefit from bariatric evaluation outpatient.  Weakness/debility/deconditioning: --OT recommending SNF --PT consult pending --Will consult TOC for evaluation as likely would benefit from SNF placement given his recurrent hospitalizations and physical decline  DVT prophylaxis:   Lovenox   Code Status: Full Code Family Communication: No family present at bedside this morning  Disposition Plan:  Level of care: Progressive Status is: Inpatient  Remains inpatient appropriate because: Continues on high O2 supplementation/BiPAP, on IV diuresis     Consultants:  Cardiology  Procedures:  None  Antimicrobials:  None   Subjective: Patient seen examined bedside, resting comfortably.  Eating breakfast.  PT at bedside.  Continues with shortness of breath, on 5 L nasal cannula this morning.  Patient feels weak and fatigued this morning.  Per social work, patient does have trilogy NIV at home but is working with pulmonology, Dr. Lanney Gins possible BiPAP as a better option as apparently he can tolerate the high pressures of NIV.    Denies headache, no chest pain, no abdominal pain.  No other questions or concerns at this time.  No acute concerns overnight per nursing staff.  Objective: Vitals:   04/06/21 2027 04/06/21 2341 04/07/21 0346 04/07/21 0751  BP: (!) 144/87 104/76 132/65 121/82  Pulse: 83 84 83 83  Resp: 19 17  19   Temp: 98.5 F (36.9 C) 98.3 F (36.8 C) 98.3 F (36.8 C) 99.1 F (37.3 C)  TempSrc:  Oral Oral Oral  SpO2: 95% 95% 97% 95%  Weight:   (!) 184.6 kg   Height:        Intake/Output Summary (Last 24 hours) at 04/07/2021 1146 Last data filed at 04/07/2021 1012 Gross per 24 hour  Intake 960 ml  Output 1300 ml   Net -340 ml   Filed Weights   04/05/21 0457 04/06/21 0500 04/07/21 0346  Weight: (!) 187 kg (!) 188.7 kg (!) 184.6 kg    Examination:  General exam: Appears calm and comfortable, morbidly obese Respiratory system: Decreased breath sounds bilateral bases, normal respiratory effort without accessory muscle use, no wheezing, on 5 L nasal cannula Cardiovascular system: S1 & S2 heard, RRR. No JVD, murmurs, rubs, gallops or clicks. No pedal edema. Gastrointestinal system: Abdomen is nondistended, protuberant abdomen, soft and nontender. No organomegaly or masses felt. Normal bowel sounds heard. Central nervous system: Alert and oriented. No focal neurological deficits. Extremities: Symmetric 5 x 5 power. Skin: No rashes, lesions or ulcers Psychiatry: Judgement and insight appear poor. Mood & affect appropriate.     Data Reviewed: I have personally reviewed following labs and imaging studies  CBC: Recent Labs  Lab 04/04/21 1539 04/04/21 2021  WBC 7.8 8.8  HGB 14.6 13.7  HCT 56.1* 53.2*  MCV 90.5 91.9  PLT 158 650   Basic Metabolic Panel: Recent Labs  Lab 04/04/21 1725 04/04/21 2021 04/05/21 0950 04/06/21 0435 04/07/21 0339  NA 145  --  144 140 140  K 4.5  --  4.8 4.1 4.0  CL 100  --  99 97* 96*  CO2 36*  --  40* 35* 36*  GLUCOSE 82  --  73 110* 86  BUN 16  --  19 23 22   CREATININE 0.92 0.76 1.02 1.17 1.16  CALCIUM 9.3  --  8.5* 8.3* 8.3*   GFR: Estimated Creatinine Clearance: 114.8 mL/min (by C-G formula based on SCr of 1.16 mg/dL). Liver Function Tests: Recent Labs  Lab 04/04/21 1725  AST 16  ALT 19  ALKPHOS 119  BILITOT 0.8  PROT 9.1*  ALBUMIN 4.1   No results for input(s): LIPASE, AMYLASE in the last 168 hours. No results for input(s): AMMONIA in the last 168 hours. Coagulation Profile: No results for input(s): INR, PROTIME in the last 168 hours. Cardiac Enzymes: No results for input(s): CKTOTAL, CKMB, CKMBINDEX, TROPONINI in the last 168 hours. BNP  (last 3 results) No results for input(s): PROBNP in the last 8760 hours. HbA1C: No results for input(s): HGBA1C in the last 72 hours. CBG: Recent Labs  Lab 04/04/21 2229 04/04/21 2340  GLUCAP 69* 93   Lipid Profile: No results for input(s): CHOL, HDL, LDLCALC, TRIG, CHOLHDL, LDLDIRECT in the last 72 hours. Thyroid Function Tests: No results for input(s): TSH, T4TOTAL, FREET4, T3FREE, THYROIDAB in the last 72 hours. Anemia Panel: No results for input(s): VITAMINB12, FOLATE, FERRITIN, TIBC, IRON, RETICCTPCT in the last 72 hours. Sepsis Labs: No results for input(s): PROCALCITON, LATICACIDVEN in the last 168 hours.  Recent Results (from the past 240 hour(s))  Resp Panel by RT-PCR (Flu A&B, Covid) Nasopharyngeal Swab     Status: None   Collection Time: 04/04/21  3:39 PM   Specimen: Nasopharyngeal Swab; Nasopharyngeal(NP) swabs in vial transport medium  Result Value Ref Range Status   SARS Coronavirus 2 by RT PCR NEGATIVE NEGATIVE Final    Comment: (NOTE) SARS-CoV-2 target nucleic acids are NOT DETECTED.  The SARS-CoV-2 RNA is generally detectable in upper respiratory specimens during the acute phase of infection. The lowest concentration of SARS-CoV-2 viral copies this assay can detect is 138 copies/mL. A negative result does not preclude SARS-Cov-2 infection and should not be used as the sole basis for treatment or other patient management decisions. A negative result may occur with  improper specimen collection/handling, submission of specimen other than nasopharyngeal swab, presence of viral mutation(s) within the areas targeted by this assay, and inadequate number of viral copies(<138 copies/mL). A negative result must be combined with clinical observations, patient history, and epidemiological information. The expected result is Negative.  Fact Sheet for Patients:  EntrepreneurPulse.com.au  Fact Sheet for Healthcare Providers:   IncredibleEmployment.be  This test is no t yet approved or cleared by the [place] FDA and  has been authorized for detection and/or diagnosis of SARS-CoV-2 by FDA under an Emergency Use Authorization (EUA). This EUA will remain  in effect (meaning this test can be used) for the duration of the COVID-19 declaration under Section 564(b)(1) of the Act, 21 U.S.C.section 360bbb-3(b)(1), unless the authorization is terminated  or revoked sooner.       Influenza A by PCR NEGATIVE NEGATIVE Final   Influenza B by PCR NEGATIVE NEGATIVE Final    Comment: (NOTE) The Xpert Xpress SARS-CoV-2/FLU/RSV plus assay is intended as an aid in the diagnosis of influenza from Nasopharyngeal swab specimens and should not be used as a sole basis for treatment. Nasal washings and aspirates are unacceptable for Xpert Xpress SARS-CoV-2/FLU/RSV testing.  Fact Sheet for Patients: EntrepreneurPulse.com.au  Fact Sheet for Healthcare Providers: IncredibleEmployment.be  This test is not yet approved or cleared by the [place] FDA and has been authorized for detection and/or diagnosis of SARS-CoV-2 by  FDA under an Emergency Use Authorization (EUA). This EUA will remain in effect (meaning this test can be used) for the duration of the COVID-19 declaration under Section 564(b)(1) of the Act, 21 U.S.C. section 360bbb-3(b)(1), unless the authorization is terminated or revoked.  Performed at [facility], [address] [place], [place] [address]   MRSA Next Gen by PCR, Nasal     Status: None   Collection Time: 04/04/21 10:29 PM   Specimen: Nasal Mucosa; Nasal Swab  Result Value Ref Range Status   MRSA by PCR Next Gen NOT DETECTED NOT DETECTED Final    Comment: (NOTE) The GeneXpert MRSA Assay (FDA approved for NASAL specimens only), is one component of a comprehensive MRSA colonization surveillance program. It is not intended to  diagnose MRSA infection nor to guide or monitor treatment for MRSA infections. Test performance is not FDA approved in patients less than 34 years old. Performed at [facility], [address], [place], [place] [address]          Radiology Studies: [facility] Chest [place] 1 View  Result Date: 04/07/2021 CLINICAL DATA:  Shortness of breath. EXAM: PORTABLE CHEST 1 VIEW COMPARISON:  Chest x-ray from yesterday. FINDINGS: The patient is rotated to the right. Stable cardiomegaly. Unchanged pulmonary vascular congestion and mild interstitial thickening. New linear atelectasis in the right mid lung. No focal consolidation, pleural effusion, or pneumothorax. No acute osseous abnormality. IMPRESSION: 1. Unchanged mild congestive heart failure. Electronically Signed   By: Titus Dubin M.D.   On: 04/07/2021 08:07   DG Chest Port 1 View  Result Date: 04/06/2021 CLINICAL DATA:  Shortness of breath with chest pain EXAM: PORTABLE CHEST 1 VIEW COMPARISON:  04/04/2021 FINDINGS: Cardiac shadow is enlarged but stable. Vascular congestion is again identified. No focal confluent infiltrate or sizable effusion is seen. The previously seen bibasilar atelectasis has cleared. No bony abnormality is noted. IMPRESSION: Stable mild CHF. Interval clearing of bibasilar atelectasis. Electronically Signed   By: [name] Catalina M.D.   On: 04/06/2021 18:12        Scheduled Meds:  aspirin EC  81 mg Oral Daily   budesonide  0.25 mg Nebulization BID   carvedilol  6.25 mg Oral Daily   Chlorhexidine Gluconate Cloth  6 each Topical Q0600   enoxaparin (LOVENOX) injection  0.5 mg/kg Subcutaneous Q24H   ferrous sulfate  325 mg Oral BID WC   fluticasone  2 spray Each Nare Daily   furosemide  40 mg Intravenous Q12H   ipratropium-albuterol  3 mL Nebulization BID   loratadine  10 mg Oral Daily   losartan  25 mg Oral Daily   pantoprazole  40 mg Oral Daily   potassium chloride  10 mEq Oral Daily   senna-docusate  2 tablet  Oral BID   simvastatin  10 mg Oral q1800   sodium chloride flush  3 mL Intravenous Q12H   Continuous Infusions:  sodium chloride       LOS: 3 days    Time spent: 39 minutes spent on chart review, discussion with nursing staff, consultants, updating family and interview/physical exam; more than 50% of that time was spent in counseling and/or coordination of care.    Bryn Saline J [place] DO [facility] Available via Epic secure chat 7am-7pm After these hours, please refer to coverage provider listed on [email or web address] 04/07/2021, 11:46 AM

## 2021-04-07 NOTE — Evaluation (Signed)
Physical Therapy Evaluation Patient Details Name: Gary [name] MRN: [id number] DOB: 02-21-1956 Today's Date: 04/07/2021  History of Present Illness  Gary [name] is a 65 y.o. African-American male with medical history significant for CHF with grade 3 diastolic dysfunction, COPD, hypertension, PVD, COPD, COVID-19, and OSA who was supposed to be on CPAP however he does not have it yet, who presented to the ER with acute onset of worsening dyspnea over the last couple of days with associated dry cough and wheezing.   Clinical Impression  Pt admitted with above diagnosis. Pt received supine in bed agreeable to PT services. At base line reports despite recent hospitalizations he was mod-I with ambulating in his apartment with RW. Receives ADL/IADL help from wife, and sleeps in a hospital bed which he is independent in getting in/out of at home. Pt resting on 5L/min with initial sats at 89%. After cuing on PLB, SPO2 returns quickly to mid 90's. Pt mod-I with use og hospital bed features to attain sitting EOB with good static sitting balance. Requires bed elevated almost maximally, but able to stand to BRW with minguard and cuing for safe hand placement. Per pt this has been new baseline for him. Pt only tolerating amb ~2' at [facility] due to L foot pain wishing to return to bed requiring UE support on BRW to offload LLE. Anticipate if pain improves in L foot with Wb'ing pt can safely ambulate household distances to return home with [facility] PT safely due to having adequate equipment to maintain independence with no physical assist need for transfers and walking at this time. Previously per [facility] team, pt denied [place] services due to insurance. Pt would benefit from OP PT as alternative if able to display ability to ambulate safe distance to and from car. Will continue to work with pt and progress OOB mobility to ensure safe d/c to home. If unable to display safe household distances in coming days, PT heavily recommends SNF.  Pt currently with functional limitations due to the deficits listed below (see PT Problem List). Pt will benefit from skilled PT to increase their independence and safety with mobility to allow discharge to the venue listed below.      Recommendations for follow up therapy are one component of a multi-disciplinary discharge planning process, led by the attending physician.  Recommendations may be updated based on patient status, additional functional criteria and insurance authorization.  Follow Up Recommendations Skilled nursing-short term rehab (<3 hours/day)    Assistance Recommended at Discharge Intermittent Supervision/Assistance  Functional Status Assessment Patient has had a recent decline in their functional status and demonstrates the ability to make significant improvements in function in a reasonable and predictable amount of time.  Equipment Recommendations  None recommended by PT    Recommendations for Other Services       Precautions / Restrictions Precautions Precautions: Fall Restrictions Weight Bearing Restrictions: No      Mobility  Bed Mobility Overal bed mobility: Modified Independent;Needs Assistance (with hospital bed settings. Has hospital bed he uses at home) Bed Mobility: Supine to Sit;Sit to Supine       Sit to supine: Mod assist   General bed mobility comments: Mod-I with supine to sit, requires modA At LE's to return to supine. Scoots up to [facility] independently with use of bed controls and UE support. Patient Response: Cooperative;Flat affect  Transfers Overall transfer level: Needs assistance Equipment used: Rolling walker (2 wheels) Transfers: Sit to/from Stand Sit to Stand: Min guard;From elevated surface  General transfer comment: Requires bed almost maximally elevated to attain standing which is baseline for pt.    Ambulation/Gait Ambulation/Gait assistance: Min guard Gait Distance (Feet): 2 Feet Assistive device: Rolling walker  (2 wheels) (bariatric) Gait Pattern/deviations: Step-to pattern;Antalgic       General Gait Details: Declined further ambulation due to L foot pain from fluid build up 2/2 CHF.  Stairs            Wheelchair Mobility    Modified Rankin (Stroke Patients Only)       Balance Overall balance assessment: Needs assistance Sitting-balance support: Feet supported Sitting balance-[name] Scale: Good Sitting balance - Comments: steady sitting at edge of bed   Standing balance support: During functional activity;Bilateral upper extremity supported;Reliant on assistive device for balance Standing balance-[name] Scale: Fair Standing balance comment: Relies on BUE support on BRW to maintain standing and to offload LLE.                             Pertinent Vitals/Pain Pain Assessment: Faces Faces Pain Scale: Hurts a little bit Pain Location: L foot Pain Descriptors / Indicators: Cramping;Discomfort;Pressure Pain Intervention(s): Limited activity within patient's tolerance;Repositioned    Home Living Family/patient expects to be discharged to:: Private residence Living Arrangements: Spouse/significant other Available Help at Discharge: Family Type of Home: Apartment Home Access: Stairs to enter Entrance Stairs-Rails: None Entrance Stairs-Number of Steps: 1   Home Layout: One level Home Equipment: Conservation officer, nature (2 wheels);Cane - single point;BSC/3in1;Hospital bed Additional Comments: O2 CPAP, O2. Reports getting rird of lift becuase did not need it.    Prior Function Prior Level of Function : Needs assist       Physical Assist : ADLs (physical)   ADLs (physical): Grooming;Bathing;Dressing;Toileting;IADLs Mobility Comments: Reports ambulating household distances with RW since previous admission. ADLs Comments: Prior to acute hospitalization pt required assit in all ADL/IADL due to previous hospitzliations; prior to that he was I in ADL, assist with IADL as needed      Hand Dominance   Dominant Hand: Right    Extremity/Trunk Assessment   Upper Extremity Assessment Upper Extremity Assessment: Generalized weakness    Lower Extremity Assessment Lower Extremity Assessment: Generalized weakness    Cervical / Trunk Assessment Cervical / Trunk Assessment: Normal  Communication   Communication: No difficulties  Cognition Arousal/Alertness: Awake/alert Behavior During Therapy: WFL for tasks assessed/performed Overall Cognitive Status: Within Functional Limits for tasks assessed                                 General Comments: Does not seem overly interested in therapy and education provided. Distant with therapist primarily focusing on TV throughout session. Overall appropraite with conversation, question answering, etc.        General Comments General comments (skin integrity, edema, etc.): Initially at rest on 5L/min with SPO2 at 89%. WIth PLB returned to mid 90's quickly. Maintained in mid 90's post OOB mobility. HR 85-90's BPM.    Exercises Other Exercises Other Exercises: Role of PT in acute setting, d/c recs, safe use of DME.   Assessment/Plan    PT Assessment Patient needs continued PT services  PT Problem List Decreased strength;Cardiopulmonary status limiting activity;Pain;Decreased range of motion;Decreased activity tolerance;Decreased knowledge of use of DME;Decreased balance;Obesity;Decreased mobility;Decreased safety awareness       PT Treatment Interventions DME instruction;Balance training;Gait training;Stair training;Functional mobility training;Therapeutic activities;Therapeutic exercise;Patient/family education;Neuromuscular  re-education    PT Goals (Current goals can be found in the Care Plan section)  Acute Rehab PT Goals Patient Stated Goal: to go home PT Goal Formulation: With patient Time For Goal Achievement: 04/03/21 Potential to Achieve Goals: Good    Frequency Min 2X/week   Barriers to  discharge        Co-evaluation               AM-PAC PT "6 Clicks" Mobility  Outcome Measure Help needed turning from your back to your side while in a flat bed without using bedrails?: A Little Help needed moving from lying on your back to sitting on the side of a flat bed without using bedrails?: A Lot Help needed moving to and from a bed to a chair (including a wheelchair)?: A Little Help needed standing up from a chair using your arms (e.g., wheelchair or bedside chair)?: A Little Help needed to walk in hospital room?: A Little Help needed climbing 3-5 steps with a railing? : Total 6 Click Score: 15    End of Session Equipment Utilized During Treatment: Gait belt;Oxygen Activity Tolerance: Patient tolerated treatment well Patient left: in bed;with bed alarm set;with call bell/phone within reach Nurse Communication: Mobility status PT Visit Diagnosis: Other abnormalities of gait and mobility (R26.89);Muscle weakness (generalized) (M62.81);Difficulty in walking, not elsewhere classified (R26.2);Unsteadiness on feet (R26.81)    Time: 6147-0929 PT Time Calculation (min) (ACUTE ONLY): 26 min   Charges:   PT Evaluation $PT Eval Moderate Complexity: 1 Mod PT Treatments $Therapeutic Activity: 8-22 mins       Anyae Griffith M. Fairly IV, PT, DPT Physical Therapist- [facility]  04/07/2021, 11:45 AM

## 2021-04-08 DIAGNOSIS — I5031 Acute diastolic (congestive) heart failure: Secondary | ICD-10-CM

## 2021-04-08 LAB — CBC
HCT: 45.3 % (ref 39.0–52.0)
Hemoglobin: 12.3 g/dL — ABNORMAL LOW (ref 13.0–17.0)
MCH: 23.7 pg — ABNORMAL LOW (ref 26.0–34.0)
MCHC: 27.2 g/dL — ABNORMAL LOW (ref 30.0–36.0)
MCV: 87.1 fL (ref 80.0–100.0)
Platelets: 116 10*3/uL — ABNORMAL LOW (ref 150–400)
RBC: 5.2 MIL/uL (ref 4.22–5.81)
RDW: 19.1 % — ABNORMAL HIGH (ref 11.5–15.5)
WBC: 7.3 10*3/uL (ref 4.0–10.5)
nRBC: 0.4 % — ABNORMAL HIGH (ref 0.0–0.2)

## 2021-04-08 LAB — BASIC METABOLIC PANEL
Anion gap: 4 — ABNORMAL LOW (ref 5–15)
BUN: 22 mg/dL (ref 8–23)
CO2: 37 mmol/L — ABNORMAL HIGH (ref 22–32)
Calcium: 8.1 mg/dL — ABNORMAL LOW (ref 8.9–10.3)
Chloride: 96 mmol/L — ABNORMAL LOW (ref 98–111)
Creatinine, Ser: 1.18 mg/dL (ref 0.61–1.24)
GFR, Estimated: >60 mL/min (ref >60)
Glucose, Bld: 95 mg/dL (ref 70–99)
Potassium: 4 mmol/L (ref 3.5–5.1)
Sodium: 137 mmol/L (ref 135–145)

## 2021-04-08 MED ORDER — TORSEMIDE 20 MG PO TABS
40.0000 mg | ORAL_TABLET | Freq: Two times a day (BID) | ORAL | Status: DC
  Administered 2021-04-08 – 2021-04-11 (×6): 40 mg via ORAL
  Filled 2021-04-08 (×6): qty 2

## 2021-04-08 NOTE — TOC Progression Note (Addendum)
Transition of Care [facility]) - Progression Note    Patient Details  Name: Gary [name] MRN: [id number] Date of Birth: 23-Dec-1955  Transition of Care [facility]) CM/SW [place], [place] Phone Number: 04/08/2021, 8:54 AM  Clinical Narrative:     Update: CSW met with patient at bedside, he reports he is interested in going to SNF rehab and is aware he will have to give up his monthly check to facility for payment due to medicaid. Prefers something in [place] or [place], [place] to send referrals. Will provide bed offers to patient once available.    CSW spoke with Thedore Mins with [employer] who reports patient has a Trilogy at home  which can do everything a BiPap and CPAP can do, and more.   CSW spoke with patient to complete readmission risk assess.   Patient confirms he lives with his wife and usually drives himself to appointments. PCP and Pharmacy is [facility].  Patient has a RW, cane, and home o2 through [employer]. Patient has hospital bed, hoyer lift, bedside commode, and wheelchair.         Patient said he has had 2 COVID vaccines and 2 boosters.   Patient has NIV at home, patient reports he needs a bipap at home. MD has been informed of patient's concerns.   TOC will attempt home health services at dc, however patient aware that his medicaid is a barrier in receiving services.    Expected Discharge Plan: [place] Barriers to Discharge: Continued Medical Work up  Expected Discharge Plan and Services Expected Discharge Plan: [place] Choice:  (TBD) Living arrangements for the past 2 months: Single Family Home                                       Social Determinants of Health (SDOH) Interventions    Readmission Risk Interventions Readmission Risk Prevention Plan 04/07/2021 03/12/2021 03/01/2020  Transportation Screening Complete Complete Complete  PCP or Specialist Appt within 3-5 Days Complete  Complete Not Complete  Not Complete comments - - To be scheduled by unit secretary  [place] or [place] - Complete Complete  Social Work Consult for [place] Planning/Counseling - Complete Complete  Palliative Care Screening Not Applicable Not Applicable Not Applicable  Medication Review Press photographer) Complete Complete Complete  Some recent data might be hidden

## 2021-04-08 NOTE — Progress Notes (Signed)
PROGRESS NOTE    Gary [name]  QIW:[id number] DOB: July 24, 1955 DOA: 04/04/2021 PCP: [facility] [place]    Brief Narrative:   65 year old male with past medical history significant for chronic diastolic congestive heart failure, COPD, essential hypertension, PVD, obstructive sleep apnea, recent COVID-19 viral pneumonia who presents to [facility] ED on 12/20 via EMS for shortness of breath.  On EMS arrival patient was noted to be hypoxic with SPO2's in the 50s on 2 L nasal cannula and was placed on CPAP.   Recent admission for COVID-19 viral pneumonia with respiratory failure and ARDS requiring intubation on 02/21/2021 and transferred to [facility] for further management.  Patient was subsequently extubated on 02/26/2021 with gradual improvement and transferred back to [facility] on 03/11/2021.  Patient was discharge to home with home health on 03/21/2021.   In the ED, temperature 98.1 F, HR 78, BP 124/71, respiratory rate 27, SPO2 76% on CPAP which was subsequently transitioned to BiPAP.  Sodium 145, potassium 4.5, chloride 100, CO2 36, glucose 82, BUN 16, creatinine 0.92.  BNP 239.7, high sensitive troponin 8>10>11; within normal limits.  WBC 7.8, hemoglobin 14.6, platelets 158.  Covid-19 PCR negative peer influenza A/B PCR negative.  Chest x-ray with changes of mild CHF with new bibasilar atelectasis.  EKG with sinus rhythm, rate 80 with poor R wave progression.  EDP administered 60 mg IV Lasix and continued on BiPAP.  Hospital service consulted for further evaluation and management of decompensated congestive heart failure exacerbation with acute on chronic hypoxic respiratory failure.  Assessment & Plan:   Principal Problem:   Acute CHF (congestive heart failure) ([name]) Active Problems:   COPD (chronic obstructive pulmonary disease) ([name])   Acute on chronic respiratory failure ([name])   Hyperlipidemia  Acute on chronic hypoxic respiratory failure, POA At baseline, patient  on 2 L nasal cannula.   On EMS arrival was found to be oxygenating in the 50s on 2 L and subsequently placed on BiPAP.   Suspect etiology multifactorial in the setting of decompensated CHF and likely noncompliance with home CPAP/BiPAP. -Patient inquiring about home BiPAP.  Informed by pulmonary the patient has a trilogy NIV at home capable of BiPAP, CPAP.  Suspect nonadherence to therapy and perhaps poor comprehension Plan: Patient back on 2 L.  Continue treatment as below   Chronic diastolic congestive heart failure, decompensated Patient presenting with progressive shortness of breath, found to be hypoxic with SPO2 in the 50s on his home 2 L nasal cannula.   Etiology likely secondary to decompensated CHF with elevated BNP and vascular congestion on chest x-ray. --Cardiology following, appreciate assistance -- Continues with net negative fluid balance Plan: Last dose of IV diuretic today Per cardiology will transition to p.o. diuretics from p.m. dose today Continue daily weights and I's and O's Monitor electrolytes and kidney function while on diuretics   Hx OSA Discussed with pulmonary Patient has a trilogy NIV at home Suspect noncompliance Not a candidate for home BiPAP at this time as the trilogy will have this capability Plan: BiPAP nightly and during naps Titrate supplemental oxygen to maintain SPO2 greater than 88% Benefit from STIR placement   COPD on home oxygen 2-3 L nasal cannula --Pulmicort neb twice daily --duonebs --Titrate supplemental oxygen to maintain SPO2 greater than 88%   Essential hypertension --Carvedilol 6.25 mg p.o. daily --Losartan 25 mg p.o. daily   Peripheral vascular disease --Continue aspirin and statin   Iron deficiency anemia --Ferrous sulfate 325 mg p.o. twice  daily   GERD: Protonix 40 mg p.o. daily   Anxiety: Xanax 0.25 mg p.o. twice daily as needed   Hx of recent COVID-19 viral pneumonia  Patient with recent hospitalization with  COVID-19 viral pneumonia and subsequent ARDS requiring ventilatory support.  COVID-19/influenza A/B PCR negative on this admission. --Continue supportive care as above, oxygen   Morbid obesity Body mass index is 50.87 kg/m.  Discussed with patient needs for aggressive lifestyle changes/weight loss as this complicates all facets of care.  Outpatient follow-up with PCP.  May benefit from bariatric evaluation outpatient.   Weakness/debility/deconditioning: -- Therapy recommending skilled nursing facility --Will consult TOC for evaluation as likely would benefit from SNF placement given his recurrent hospitalizations and physical decline --Patient seemed agreeable to bed search   DVT prophylaxis: SQ Lovenox Code Status: Full Family Communication: None today Disposition Plan: Status is: Inpatient  Remains inpatient appropriate because: Acute on chronic hypoxic respiratory failure, slowly resolving.  May benefit from skilled nursing facility placement.  TOC engaged.       Level of care: Progressive  Consultants:  Cardiology  Procedures:  None  Antimicrobials: None   Subjective: Seen and examined.  Resting comfortably in bed.  No visible distress.  No pain complaints.  Mentating clearly  Objective: Vitals:   04/07/21 1934 04/07/21 2011 04/08/21 0807 04/08/21 1107  BP: 119/77  114/81 102/79  Pulse: 82  78 84  Resp: 19  19 18   Temp: 98.3 F (36.8 C)  98.2 F (36.8 C) 98.4 F (36.9 C)  TempSrc:    Oral  SpO2: 93% 93% 94% 92%  Weight:      Height:        Intake/Output Summary (Last 24 hours) at 04/08/2021 1154 Last data filed at 04/08/2021 1002 Gross per 24 hour  Intake 1560 ml  Output 2200 ml  Net -640 ml   Filed Weights   04/05/21 0457 04/06/21 0500 04/07/21 0346  Weight: (!) 187 kg (!) 188.7 kg (!) 184.6 kg    Examination:  General exam: No acute distress Respiratory system: Decreased sounds at bases.  Normal work of breathing.  2 L Cardiovascular  system: S1-S2, RRR, no murmurs, no pedal edema Gastrointestinal system: Soft, NT/ND, normal bowel sounds Central nervous system: Alert and oriented. No focal neurological deficits. Extremities: Symmetric 5 x 5 power. Skin: No rashes, lesions or ulcers Psychiatry: Judgement and insight appear impaired. Mood & affect flattened.     Data Reviewed: I have personally reviewed following labs and imaging studies  CBC: Recent Labs  Lab 04/04/21 1539 04/04/21 2021 04/08/21 0650  WBC 7.8 8.8 7.3  HGB 14.6 13.7 12.3*  HCT 56.1* 53.2* 45.3  MCV 90.5 91.9 87.1  PLT 158 177 220*   Basic Metabolic Panel: Recent Labs  Lab 04/04/21 1725 04/04/21 2021 04/05/21 0950 04/06/21 0435 04/07/21 0339 04/08/21 0650  NA 145  --  144 140 140 137  K 4.5  --  4.8 4.1 4.0 4.0  CL 100  --  99 97* 96* 96*  CO2 36*  --  40* 35* 36* 37*  GLUCOSE 82  --  73 110* 86 95  BUN 16  --  19 23 22 22   CREATININE 0.92 0.76 1.02 1.17 1.16 1.18  CALCIUM 9.3  --  8.5* 8.3* 8.3* 8.1*   GFR: Estimated Creatinine Clearance: 112.8 mL/min (by C-G formula based on SCr of 1.18 mg/dL). Liver Function Tests: Recent Labs  Lab 04/04/21 1725  AST 16  ALT 19  ALKPHOS  119  BILITOT 0.8  PROT 9.1*  ALBUMIN 4.1   No results for input(s): LIPASE, AMYLASE in the last 168 hours. No results for input(s): AMMONIA in the last 168 hours. Coagulation Profile: No results for input(s): INR, PROTIME in the last 168 hours. Cardiac Enzymes: No results for input(s): CKTOTAL, CKMB, CKMBINDEX, TROPONINI in the last 168 hours. BNP (last 3 results) No results for input(s): PROBNP in the last 8760 hours. HbA1C: No results for input(s): HGBA1C in the last 72 hours. CBG: Recent Labs  Lab 04/04/21 2229 04/04/21 2340  GLUCAP 69* 93   Lipid Profile: No results for input(s): CHOL, HDL, LDLCALC, TRIG, CHOLHDL, LDLDIRECT in the last 72 hours. Thyroid Function Tests: No results for input(s): TSH, T4TOTAL, FREET4, T3FREE, THYROIDAB in  the last 72 hours. Anemia Panel: No results for input(s): VITAMINB12, FOLATE, FERRITIN, TIBC, IRON, RETICCTPCT in the last 72 hours. Sepsis Labs: No results for input(s): PROCALCITON, LATICACIDVEN in the last 168 hours.  Recent Results (from the past 240 hour(s))  Resp Panel by RT-PCR (Flu A&B, Covid) Nasopharyngeal Swab     Status: None   Collection Time: 04/04/21  3:39 PM   Specimen: Nasopharyngeal Swab; Nasopharyngeal(NP) swabs in vial transport medium  Result Value Ref Range Status   SARS Coronavirus 2 by RT PCR NEGATIVE NEGATIVE Final    Comment: (NOTE) SARS-CoV-2 target nucleic acids are NOT DETECTED.  The SARS-CoV-2 RNA is generally detectable in upper respiratory specimens during the acute phase of infection. The lowest concentration of SARS-CoV-2 viral copies this assay can detect is 138 copies/mL. A negative result does not preclude SARS-Cov-2 infection and should not be used as the sole basis for treatment or other patient management decisions. A negative result may occur with  improper specimen collection/handling, submission of specimen other than nasopharyngeal swab, presence of viral mutation(s) within the areas targeted by this assay, and inadequate number of viral copies(<138 copies/mL). A negative result must be combined with clinical observations, patient history, and epidemiological information. The expected result is Negative.  Fact Sheet for Patients:  EntrepreneurPulse.com.au  Fact Sheet for Healthcare Providers:  IncredibleEmployment.be  This test is no t yet approved or cleared by the [place] FDA and  has been authorized for detection and/or diagnosis of SARS-CoV-2 by FDA under an Emergency Use Authorization (EUA). This EUA will remain  in effect (meaning this test can be used) for the duration of the COVID-19 declaration under Section 564(b)(1) of the Act, 21 U.S.C.section 360bbb-3(b)(1), unless the  authorization is terminated  or revoked sooner.       Influenza A by PCR NEGATIVE NEGATIVE Final   Influenza B by PCR NEGATIVE NEGATIVE Final    Comment: (NOTE) The Xpert Xpress SARS-CoV-2/FLU/RSV plus assay is intended as an aid in the diagnosis of influenza from Nasopharyngeal swab specimens and should not be used as a sole basis for treatment. Nasal washings and aspirates are unacceptable for Xpert Xpress SARS-CoV-2/FLU/RSV testing.  Fact Sheet for Patients: EntrepreneurPulse.com.au  Fact Sheet for Healthcare Providers: IncredibleEmployment.be  This test is not yet approved or cleared by the [place] FDA and has been authorized for detection and/or diagnosis of SARS-CoV-2 by FDA under an Emergency Use Authorization (EUA). This EUA will remain in effect (meaning this test can be used) for the duration of the COVID-19 declaration under Section 564(b)(1) of the Act, 21 U.S.C. section 360bbb-3(b)(1), unless the authorization is terminated or revoked.  Performed at [facility], [address] [place], [place] [address]  MRSA Next Gen by PCR, Nasal     Status: None   Collection Time: 04/04/21 10:29 PM   Specimen: Nasal Mucosa; Nasal Swab  Result Value Ref Range Status   MRSA by PCR Next Gen NOT DETECTED NOT DETECTED Final    Comment: (NOTE) The GeneXpert MRSA Assay (FDA approved for NASAL specimens only), is one component of a comprehensive MRSA colonization surveillance program. It is not intended to diagnose MRSA infection nor to guide or monitor treatment for MRSA infections. Test performance is not FDA approved in patients less than 82 years old. Performed at [facility], [address] [place], [place] [address]          Radiology Studies: [facility] Chest [place] 1 View  Result Date: 04/07/2021 CLINICAL DATA:  Shortness of breath. EXAM: PORTABLE CHEST 1 VIEW COMPARISON:  Chest x-ray from yesterday.  FINDINGS: The patient is rotated to the right. Stable cardiomegaly. Unchanged pulmonary vascular congestion and mild interstitial thickening. New linear atelectasis in the right mid lung. No focal consolidation, pleural effusion, or pneumothorax. No acute osseous abnormality. IMPRESSION: 1. Unchanged mild congestive heart failure. Electronically Signed   By: Titus Dubin M.D.   On: 04/07/2021 08:07   DG Chest Port 1 View  Result Date: 04/06/2021 CLINICAL DATA:  Shortness of breath with chest pain EXAM: PORTABLE CHEST 1 VIEW COMPARISON:  04/04/2021 FINDINGS: Cardiac shadow is enlarged but stable. Vascular congestion is again identified. No focal confluent infiltrate or sizable effusion is seen. The previously seen bibasilar atelectasis has cleared. No bony abnormality is noted. IMPRESSION: Stable mild CHF. Interval clearing of bibasilar atelectasis. Electronically Signed   By: [name] Catalina M.D.   On: 04/06/2021 18:12        Scheduled Meds:  aspirin EC  81 mg Oral Daily   budesonide  0.25 mg Nebulization BID   carvedilol  6.25 mg Oral Daily   Chlorhexidine Gluconate Cloth  6 each Topical Q0600   enoxaparin (LOVENOX) injection  0.5 mg/kg Subcutaneous Q24H   ferrous sulfate  325 mg Oral BID WC   fluticasone  2 spray Each Nare Daily   ipratropium-albuterol  3 mL Nebulization BID   loratadine  10 mg Oral Daily   losartan  25 mg Oral Daily   pantoprazole  40 mg Oral Daily   potassium chloride  10 mEq Oral Daily   senna-docusate  2 tablet Oral BID   simvastatin  10 mg Oral q1800   sodium chloride flush  3 mL Intravenous Q12H   torsemide  40 mg Oral BID   Continuous Infusions:  sodium chloride       LOS: 4 days    Time spent: 35 minutes    Sidney Ace, MD [facility]   If 7PM-7AM, please contact night-coverage  04/08/2021, 11:54 AM

## 2021-04-08 NOTE — Progress Notes (Signed)
Progress Note  Patient Name: Gary [name] Date of Encounter: 04/08/2021  [facility] Cardiologist: Ida Rogue, MD   Subjective   UOP -1.6L. Patient denies chest pain. On supplemental O2. Kidney function stable.   Inpatient Medications    Scheduled Meds:  aspirin EC  81 mg Oral Daily   budesonide  0.25 mg Nebulization BID   carvedilol  6.25 mg Oral Daily   Chlorhexidine Gluconate Cloth  6 each Topical Q0600   enoxaparin (LOVENOX) injection  0.5 mg/kg Subcutaneous Q24H   ferrous sulfate  325 mg Oral BID WC   fluticasone  2 spray Each Nare Daily   furosemide  40 mg Intravenous Q12H   ipratropium-albuterol  3 mL Nebulization BID   loratadine  10 mg Oral Daily   losartan  25 mg Oral Daily   pantoprazole  40 mg Oral Daily   potassium chloride  10 mEq Oral Daily   senna-docusate  2 tablet Oral BID   simvastatin  10 mg Oral q1800   sodium chloride flush  3 mL Intravenous Q12H   Continuous Infusions:  sodium chloride     PRN Meds: sodium chloride, acetaminophen, ALPRAZolam, ondansetron (ZOFRAN) IV, sodium chloride, sodium chloride flush   Vital Signs    Vitals:   04/07/21 1438 04/07/21 1934 04/07/21 2011 04/08/21 0807  BP: 106/61 119/77  114/81  Pulse: 87 82  78  Resp: 18 19  19   Temp: 98.1 F (36.7 C) 98.3 F (36.8 C)  98.2 F (36.8 C)  TempSrc:      SpO2: 97% 93% 93% 94%  Weight:      Height:        Intake/Output Summary (Last 24 hours) at 04/08/2021 0832 Last data filed at 04/08/2021 5102 Gross per 24 hour  Intake 1680 ml  Output 1600 ml  Net 80 ml   Last 3 Weights 04/07/2021 04/06/2021 04/05/2021  Weight (lbs) 406 lb 15.5 oz 416 lb 0.1 oz 412 lb 4.2 oz  Weight (kg) 184.6 kg 188.7 kg 187 kg      Telemetry    NSR, HR 80s, 1st degree AV block, 1st degree AV block - Personally Reviewed  ECG    No new - Personally Reviewed  Physical Exam   GEN: No acute distress.   Neck: No JVD Cardiac: RRR, no murmurs, rubs, or gallops.  Respiratory:  Clear to auscultation bilaterally. GI: Soft, nontender, non-distended  MS: 1+ lower leg edema; No deformity. Neuro:  Nonfocal  Psych: Normal affect   Labs    High Sensitivity Troponin:   Recent Labs  Lab 04/04/21 1539 04/04/21 1725 04/05/21 0446 04/06/21 0435 04/06/21 1752  TROPONINIHS 8 10 11 10 11      Chemistry Recent Labs  Lab 04/04/21 1725 04/04/21 2021 04/06/21 0435 04/07/21 0339 04/08/21 0650  NA 145   < > 140 140 137  K 4.5   < > 4.1 4.0 4.0  CL 100   < > 97* 96* 96*  CO2 36*   < > 35* 36* 37*  GLUCOSE 82   < > 110* 86 95  BUN 16   < > 23 22 22   CREATININE 0.92   < > 1.17 1.16 1.18  CALCIUM 9.3   < > 8.3* 8.3* 8.1*  PROT 9.1*  --   --   --   --   ALBUMIN 4.1  --   --   --   --   AST 16  --   --   --   --  ALT 19  --   --   --   --   ALKPHOS 119  --   --   --   --   BILITOT 0.8  --   --   --   --   GFRNONAA >60   < > >60 >60 >60  ANIONGAP 9   < > 8 8 4*   < > = values in this interval not displayed.    Lipids No results for input(s): CHOL, TRIG, HDL, LABVLDL, LDLCALC, CHOLHDL in the last 168 hours.  Hematology Recent Labs  Lab 04/04/21 1539 04/04/21 2021 04/08/21 0650  WBC 7.8 8.8 7.3  RBC 6.20* 5.79 5.20  HGB 14.6 13.7 12.3*  HCT 56.1* 53.2* 45.3  MCV 90.5 91.9 87.1  MCH 23.5* 23.7* 23.7*  MCHC 26.0* 25.8* 27.2*  RDW 20.1* 19.9* 19.1*  PLT 158 177 116*   Thyroid No results for input(s): TSH, FREET4 in the last 168 hours.  BNP Recent Labs  Lab 04/04/21 1725 04/07/21 0339  BNP 239.7* 31.7    DDimer No results for input(s): DDIMER in the last 168 hours.   Radiology    DG Chest Port 1 View  Result Date: 04/07/2021 CLINICAL DATA:  Shortness of breath. EXAM: PORTABLE CHEST 1 VIEW COMPARISON:  Chest x-ray from yesterday. FINDINGS: The patient is rotated to the right. Stable cardiomegaly. Unchanged pulmonary vascular congestion and mild interstitial thickening. New linear atelectasis in the right mid lung. No focal consolidation, pleural  effusion, or pneumothorax. No acute osseous abnormality. IMPRESSION: 1. Unchanged mild congestive heart failure. Electronically Signed   By: Titus Dubin M.D.   On: 04/07/2021 08:07   DG Chest Port 1 View  Result Date: 04/06/2021 CLINICAL DATA:  Shortness of breath with chest pain EXAM: PORTABLE CHEST 1 VIEW COMPARISON:  04/04/2021 FINDINGS: Cardiac shadow is enlarged but stable. Vascular congestion is again identified. No focal confluent infiltrate or sizable effusion is seen. The previously seen bibasilar atelectasis has cleared. No bony abnormality is noted. IMPRESSION: Stable mild CHF. Interval clearing of bibasilar atelectasis. Electronically Signed   By: [name] Catalina M.D.   On: 04/06/2021 18:12    Cardiac Studies   Echo  1. Left ventricular ejection fraction, by estimation, is 60 to 65%. The  left ventricle has normal function. The left ventricle has no regional  wall motion abnormalities. Left ventricular diastolic parameters are  consistent with Grade III diastolic  dysfunction (restrictive).   2. Right ventricular systolic function is normal. The right ventricular  size is normal.   3. Left atrial size was mildly dilated.   4. The mitral valve is normal in structure. Trivial mitral valve  regurgitation. No evidence of mitral stenosis.   5. The aortic valve is normal in structure. Aortic valve regurgitation is  not visualized. No aortic stenosis is present.   6. The inferior vena cava is normal in size with greater than 50%  respiratory variability, suggesting right atrial pressure of 3 mmHg.   Patient Profile     65 y.o. male  with a history of HFpEF, hypertension, morbid obesity, sleep apnea, tobacco abuse, recent COVID-19 infection with respiratory failure, COPD, gastric ulcers, and prior GI bleeds, who is being seen today for the evaluation of acute on chronic diastolic heart failure  Assessment & Plan    Acute on chronic respiratory failure with hypoxia and  hypercapnia Morbid obesity Hypoventilation syndrome Pulmonary edema and COPD on 2L O2 at baseline H/o COVD PNA - respiratory failure multifactorial -  LVEF 60-65% - BNP 231 - stable renal function - IV lasix for CHF - h/o noncompliance with home CPAP and Bipap  Acute on chronic HFpEF - Echo in November showed EF 60-65% with G3DD - BNP mildly elevated - IV lasix 40mg  BID - Coreg 6.25mg  daily - kidney function stable - LLE on exam, continue with diuresis  Hx OSA - reports noncompliance - OP PCP Working on getting a Bipap  PVD - continue ASA to statin  For questions or updates, please contact [facility] Please consult [email or web address] for contact info under        Signed, Jeraldean Wechter Ninfa Meeker, PA-C  04/08/2021, 8:32 AM

## 2021-04-09 DIAGNOSIS — I5033 Acute on chronic diastolic (congestive) heart failure: Secondary | ICD-10-CM

## 2021-04-09 DIAGNOSIS — J9622 Acute and chronic respiratory failure with hypercapnia: Secondary | ICD-10-CM

## 2021-04-09 DIAGNOSIS — J9621 Acute and chronic respiratory failure with hypoxia: Secondary | ICD-10-CM

## 2021-04-09 LAB — BASIC METABOLIC PANEL
Anion gap: 8 (ref 5–15)
BUN: 24 mg/dL — ABNORMAL HIGH (ref 8–23)
CO2: 35 mmol/L — ABNORMAL HIGH (ref 22–32)
Calcium: 8.5 mg/dL — ABNORMAL LOW (ref 8.9–10.3)
Chloride: 96 mmol/L — ABNORMAL LOW (ref 98–111)
Creatinine, Ser: 1.16 mg/dL (ref 0.61–1.24)
GFR, Estimated: >60 mL/min (ref >60)
Glucose, Bld: 93 mg/dL (ref 70–99)
Potassium: 4.3 mmol/L (ref 3.5–5.1)
Sodium: 139 mmol/L (ref 135–145)

## 2021-04-09 NOTE — Progress Notes (Signed)
PROGRESS NOTE    Gary HSIUNG  VQM:[id number] DOB: 07/14/1955 DOA: 04/04/2021 PCP: [facility] [place]    Brief Narrative:   65 year old male with past medical history significant for chronic diastolic congestive heart failure, COPD, essential hypertension, PVD, obstructive sleep apnea, recent COVID-19 viral pneumonia who presents to [facility] ED on 12/20 via EMS for shortness of breath.  On EMS arrival patient was noted to be hypoxic with SPO2's in the 50s on 2 L nasal cannula and was placed on CPAP.   Recent admission for COVID-19 viral pneumonia with respiratory failure and ARDS requiring intubation on 02/21/2021 and transferred to [facility] for further management.  Patient was subsequently extubated on 02/26/2021 with gradual improvement and transferred back to [facility] on 03/11/2021.  Patient was discharge to home with home health on 03/21/2021.   In the ED, temperature 98.1 F, HR 78, BP 124/71, respiratory rate 27, SPO2 76% on CPAP which was subsequently transitioned to BiPAP.  Sodium 145, potassium 4.5, chloride 100, CO2 36, glucose 82, BUN 16, creatinine 0.92.  BNP 239.7, high sensitive troponin 8>10>11; within normal limits.  WBC 7.8, hemoglobin 14.6, platelets 158.  Covid-19 PCR negative peer influenza A/B PCR negative.  Chest x-ray with changes of mild CHF with new bibasilar atelectasis.  EKG with sinus rhythm, rate 80 with poor R wave progression.  EDP administered 60 mg IV Lasix and continued on BiPAP.  Hospital service consulted for further evaluation and management of decompensated congestive heart failure exacerbation with acute on chronic hypoxic respiratory failure.  Assessment & Plan:   Principal Problem:   Acute CHF (congestive heart failure) ([name]) Active Problems:   COPD (chronic obstructive pulmonary disease) ([name])   Acute on chronic respiratory failure ([name])   Hyperlipidemia  Acute on chronic hypoxic respiratory failure, POA At baseline, patient  on 2 L nasal cannula.   On EMS arrival was found to be oxygenating in the 50s on 2 L and subsequently placed on BiPAP.   Suspect etiology multifactorial in the setting of decompensated CHF and likely noncompliance with home CPAP/BiPAP. -Patient inquiring about home BiPAP.  Informed by pulmonary the patient has a trilogy NIV at home capable of BiPAP, CPAP.  Suspect nonadherence to therapy and perhaps poor comprehension Plan: Patient back on 2 L.  Continue treatment as below   Chronic diastolic congestive heart failure, decompensated Patient presenting with progressive shortness of breath, found to be hypoxic with SPO2 in the 50s on his home 2 L nasal cannula.   Etiology likely secondary to decompensated CHF with elevated BNP and vascular congestion on chest x-ray. --Cardiology following, appreciate assistance -- Continues with net negative fluid balance Plan: Completed IV diuretics this course.  Transition back to p.o. torsemide 40 mg twice daily.  Appreciate cardiology follow-up.  We will continue to monitor electrolytes, daily weights, ins and outs  Hx OSA Discussed with pulmonary Patient has a trilogy NIV at home Suspect noncompliance Not a candidate for home BiPAP at this time as the trilogy will have this capability Plan: BiPAP nightly and during naps Titrate supplemental oxygen to maintain SPO2 greater than 88% Patient agreeable short-term rehab placement Can resume home trilogy at time of discharge   COPD on home oxygen 2-3 L nasal cannula --Pulmicort neb twice daily --duonebs --Titrate supplemental oxygen to maintain SPO2 greater than 88%   Essential hypertension --Carvedilol 6.25 mg p.o. daily --Losartan 25 mg p.o. daily   Peripheral vascular disease --Continue aspirin and statin   Iron deficiency  anemia --Ferrous sulfate 325 mg p.o. twice daily   GERD: Protonix 40 mg p.o. daily   Anxiety: Xanax 0.25 mg p.o. twice daily as needed   Hx of recent COVID-19 viral  pneumonia  Patient with recent hospitalization with COVID-19 viral pneumonia and subsequent ARDS requiring ventilatory support.  COVID-19/influenza A/B PCR negative on this admission. --Continue supportive care as above, oxygen   Morbid obesity Body mass index is 50.87 kg/m.  Discussed with patient needs for aggressive lifestyle changes/weight loss as this complicates all facets of care.  Outpatient follow-up with PCP.  May benefit from bariatric evaluation outpatient.   Weakness/debility/deconditioning: -- Therapy recommending skilled nursing facility --Will consult TOC for evaluation as likely would benefit from SNF placement given his recurrent hospitalizations and physical decline --Patient seemed agreeable to bed search   DVT prophylaxis: SQ Lovenox Code Status: Full Family Communication: None today Disposition Plan: Status is: Inpatient  Remains inpatient appropriate because: Unsafe discharge plan.  Patient back on oral diuretics and is medically stable for discharge to skilled nursing facility at this time.       Level of care: Progressive  Consultants:  Cardiology  Procedures:  None  Antimicrobials: None   Subjective: Seen and examined.  Resting comfortably in bed.  No visible distress.  No pain complaints  Objective: Vitals:   04/08/21 2106 04/09/21 0051 04/09/21 0514 04/09/21 0748  BP: 115/72 107/79 (!) 129/94 (!) 99/59  Pulse: 82 84 85 88  Resp: 16 20 20 18   Temp: 98.4 F (36.9 C) 99 F (37.2 C) 98.3 F (36.8 C) 98.5 F (36.9 C)  TempSrc: Oral Axillary Axillary   SpO2: 97% 98% 97% 93%  Weight:   (!) 181.6 kg   Height:        Intake/Output Summary (Last 24 hours) at 04/09/2021 1038 Last data filed at 04/09/2021 0954 Gross per 24 hour  Intake 1320 ml  Output 2450 ml  Net -1130 ml   Filed Weights   04/06/21 0500 04/07/21 0346 04/09/21 0514  Weight: (!) 188.7 kg (!) 184.6 kg (!) 181.6 kg    Examination:  General exam: No apparent  distress Respiratory system: Decreased at bases.  Normal work of breathing.  2 L Cardiovascular system: S1-S2, RRR, no murmurs, no pedal edema Gastrointestinal system: Soft, NT/ND, normal bowel sounds Central nervous system: Alert and oriented. No focal neurological deficits. Extremities: Symmetric 5 x 5 power. Skin: No rashes, lesions or ulcers Psychiatry: Judgement and insight appear impaired. Mood & affect flattened.     Data Reviewed: I have personally reviewed following labs and imaging studies  CBC: Recent Labs  Lab 04/04/21 1539 04/04/21 2021 04/08/21 0650  WBC 7.8 8.8 7.3  HGB 14.6 13.7 12.3*  HCT 56.1* 53.2* 45.3  MCV 90.5 91.9 87.1  PLT 158 177 016*   Basic Metabolic Panel: Recent Labs  Lab 04/05/21 0950 04/06/21 0435 04/07/21 0339 04/08/21 0650 04/09/21 0500  NA 144 140 140 137 139  K 4.8 4.1 4.0 4.0 4.3  CL 99 97* 96* 96* 96*  CO2 40* 35* 36* 37* 35*  GLUCOSE 73 110* 86 95 93  BUN 19 23 22 22  24*  CREATININE 1.02 1.17 1.16 1.18 1.16  CALCIUM 8.5* 8.3* 8.3* 8.1* 8.5*   GFR: Estimated Creatinine Clearance: 113.7 mL/min (by C-G formula based on SCr of 1.16 mg/dL). Liver Function Tests: Recent Labs  Lab 04/04/21 1725  AST 16  ALT 19  ALKPHOS 119  BILITOT 0.8  PROT 9.1*  ALBUMIN 4.1  No results for input(s): LIPASE, AMYLASE in the last 168 hours. No results for input(s): AMMONIA in the last 168 hours. Coagulation Profile: No results for input(s): INR, PROTIME in the last 168 hours. Cardiac Enzymes: No results for input(s): CKTOTAL, CKMB, CKMBINDEX, TROPONINI in the last 168 hours. BNP (last 3 results) No results for input(s): PROBNP in the last 8760 hours. HbA1C: No results for input(s): HGBA1C in the last 72 hours. CBG: Recent Labs  Lab 04/04/21 2229 04/04/21 2340  GLUCAP 69* 93   Lipid Profile: No results for input(s): CHOL, HDL, LDLCALC, TRIG, CHOLHDL, LDLDIRECT in the last 72 hours. Thyroid Function Tests: No results for input(s):  TSH, T4TOTAL, FREET4, T3FREE, THYROIDAB in the last 72 hours. Anemia Panel: No results for input(s): VITAMINB12, FOLATE, FERRITIN, TIBC, IRON, RETICCTPCT in the last 72 hours. Sepsis Labs: No results for input(s): PROCALCITON, LATICACIDVEN in the last 168 hours.  Recent Results (from the past 240 hour(s))  Resp Panel by RT-PCR (Flu A&B, Covid) Nasopharyngeal Swab     Status: None   Collection Time: 04/04/21  3:39 PM   Specimen: Nasopharyngeal Swab; Nasopharyngeal(NP) swabs in vial transport medium  Result Value Ref Range Status   SARS Coronavirus 2 by RT PCR NEGATIVE NEGATIVE Final    Comment: (NOTE) SARS-CoV-2 target nucleic acids are NOT DETECTED.  The SARS-CoV-2 RNA is generally detectable in upper respiratory specimens during the acute phase of infection. The lowest concentration of SARS-CoV-2 viral copies this assay can detect is 138 copies/mL. A negative result does not preclude SARS-Cov-2 infection and should not be used as the sole basis for treatment or other patient management decisions. A negative result may occur with  improper specimen collection/handling, submission of specimen other than nasopharyngeal swab, presence of viral mutation(s) within the areas targeted by this assay, and inadequate number of viral copies(<138 copies/mL). A negative result must be combined with clinical observations, patient history, and epidemiological information. The expected result is Negative.  Fact Sheet for Patients:  EntrepreneurPulse.com.au  Fact Sheet for Healthcare Providers:  IncredibleEmployment.be  This test is no t yet approved or cleared by the [place] FDA and  has been authorized for detection and/or diagnosis of SARS-CoV-2 by FDA under an Emergency Use Authorization (EUA). This EUA will remain  in effect (meaning this test can be used) for the duration of the COVID-19 declaration under Section 564(b)(1) of the Act,  21 U.S.C.section 360bbb-3(b)(1), unless the authorization is terminated  or revoked sooner.       Influenza A by PCR NEGATIVE NEGATIVE Final   Influenza B by PCR NEGATIVE NEGATIVE Final    Comment: (NOTE) The Xpert Xpress SARS-CoV-2/FLU/RSV plus assay is intended as an aid in the diagnosis of influenza from Nasopharyngeal swab specimens and should not be used as a sole basis for treatment. Nasal washings and aspirates are unacceptable for Xpert Xpress SARS-CoV-2/FLU/RSV testing.  Fact Sheet for Patients: EntrepreneurPulse.com.au  Fact Sheet for Healthcare Providers: IncredibleEmployment.be  This test is not yet approved or cleared by the [place] FDA and has been authorized for detection and/or diagnosis of SARS-CoV-2 by FDA under an Emergency Use Authorization (EUA). This EUA will remain in effect (meaning this test can be used) for the duration of the COVID-19 declaration under Section 564(b)(1) of the Act, 21 U.S.C. section 360bbb-3(b)(1), unless the authorization is terminated or revoked.  Performed at [facility], [address] [place], [place] [address]   MRSA Next Gen by PCR, Nasal     Status: None  Collection Time: 04/04/21 10:29 PM   Specimen: Nasal Mucosa; Nasal Swab  Result Value Ref Range Status   MRSA by PCR Next Gen NOT DETECTED NOT DETECTED Final    Comment: (NOTE) The GeneXpert MRSA Assay (FDA approved for NASAL specimens only), is one component of a comprehensive MRSA colonization surveillance program. It is not intended to diagnose MRSA infection nor to guide or monitor treatment for MRSA infections. Test performance is not FDA approved in patients less than 55 years old. Performed at [facility], [address] [place], [place] [address]          Radiology Studies: No results found.      Scheduled Meds:  aspirin EC  81 mg Oral Daily   budesonide  0.25 mg Nebulization BID    carvedilol  6.25 mg Oral Daily   enoxaparin (LOVENOX) injection  0.5 mg/kg Subcutaneous Q24H   ferrous sulfate  325 mg Oral BID WC   fluticasone  2 spray Each Nare Daily   ipratropium-albuterol  3 mL Nebulization BID   loratadine  10 mg Oral Daily   losartan  25 mg Oral Daily   pantoprazole  40 mg Oral Daily   potassium chloride  10 mEq Oral Daily   senna-docusate  2 tablet Oral BID   simvastatin  10 mg Oral q1800   sodium chloride flush  3 mL Intravenous Q12H   torsemide  40 mg Oral BID   Continuous Infusions:  sodium chloride       LOS: 5 days    Time spent: 25 minutes    Sidney Ace, MD [facility]   If 7PM-7AM, please contact night-coverage  04/09/2021, 10:38 AM

## 2021-04-09 NOTE — NC FL2 (Signed)
[place] LEVEL OF CARE SCREENING TOOL     IDENTIFICATION  Patient Name: Gary [name] Birthdate: [birth year] Sex: male Admission Date (Current Location): 04/04/2021  Equality and [place] Number:  Engineering geologist and Address:  [facility], [address] [place], [place] [address]      Provider Number: [id number]  Attending Physician Name and Address:  Sidney Ace, MD  Relative Name and Phone Number:  spouse [name]    Current Level of Care: Hospital Recommended Level of Care: [place] Prior Approval Number:    Date Approved/Denied:   PASRR Number:    Discharge Plan: SNF    Current Diagnoses: Patient Active Problem List   Diagnosis Date Noted   Acute CHF (congestive heart failure) ([place]) 04/04/2021   Weakness    Pneumonia 03/11/2021   Acute respiratory failure with hypoxia and hypercapnia ([place]) 03/11/2021   (HFpEF) heart failure with preserved ejection fraction ([name])    Chronic respiratory failure with hypoxia ([place])    Hyperlipidemia 02/19/2021   Acute respiratory distress syndrome (ARDS) due to COVID-19 virus ([place])    Acute on chronic respiratory failure ([place]) 02/18/2021   Spondylosis without myelopathy or radiculopathy, lumbosacral region 11/15/2020   Lumbosacral facet joint syndrome (Bilateral) 10/31/2020   Generalized osteoarthritis of multiple sites 10/31/2020   AKI (acute kidney injury) ([place]) 03/04/2020   Hyperkalemia 03/04/2020   COPD (chronic obstructive pulmonary disease) ([place]) 03/02/2020   Marijuana use 01/17/2020   Elevated BUN 01/17/2020   Hypocalcemia 01/17/2020   Hypochloremia 01/17/2020   Low thyroid stimulating hormone (TSH) level 01/17/2020   Vitamin D deficiency 01/17/2020   Elevated sed rate 01/17/2020   Thrombocytopenia ([place]) 01/17/2020   Elevated hematocrit 01/17/2020   Elevated brain natriuretic peptide (BNP) level 01/17/2020   Positive hepatitis C antibody test 01/17/2020    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 mass index (BMI) of 50.0 to 59.9 in adult ([place]) 01/17/2020   Osteoarthritis of glenohumeral joints (Bilateral) 01/17/2020   Osteoarthritis of AC (acromioclavicular) joints (Bilateral) 01/17/2020   Lumbar Grade 1 Anterolisthesis of L4 over L5 (3 mm) (stable) 01/17/2020   Osteoarthritis of lumbar spine (L3 through S1) 01/17/2020   Lumbar facet osteoarthritis (Multilevel) (Bilateral) 01/17/2020   Osteoarthritis of hips (Bilateral) 01/17/2020   Osteoarthritis of knees (Bilateral) 01/17/2020   Tricompartment osteoarthritis of knees (Bilateral) 01/17/2020   Patellofemoral arthralgia of knees (Bilateral) 01/17/2020   Plantar and Achilles calcaneal spurs (Bilateral) 01/17/2020   Ankle edema (Bilateral) 01/17/2020   Traumatic osteoarthritis of hands (Bilateral) 01/17/2020   Polysubstance abuse ([place]) 08/25/2019   Chronic pain syndrome 07/14/2019   Pharmacologic therapy 07/14/2019   Disorder of skeletal system 07/14/2019   Problems influencing health status 07/14/2019   Chronic low back pain (1ry area of Pain) (Bilateral) (L>R) w/o sciatica 07/14/2019   Chronic shoulder pain (2ry area of Pain) (Bilateral) (L>R) 07/14/2019   Chronic feet pain (3ry area of Pain) (Bilateral) 07/14/2019   Chronic knee pain (4th area of Pain) (Bilateral) (L>R) 07/14/2019   Chronic hip pain (5th area of Pain) (Bilateral) (L>R) 07/14/2019   Chronic hand pain (6th area of Pain) (Bilateral) (L>R) 07/14/2019   Lumbar facet arthropathy 07/14/2019   DDD (degenerative disc disease), lumbosacral 07/14/2019   Closed fracture of shaft of humerus, sequela (Left) 07/14/2019   DDD (degenerative disc disease), cervical 07/14/2019   Tobacco use 05/04/2017   Lymphedema 05/04/2017   CHF (congestive heart failure) ([place]) 04/12/2017   Acute on chronic diastolic (  congestive) heart failure ([name]) 02/28/2015   Obstructive sleep apnea 02/28/2015   Edema of both feet    Closed  fracture of scapula, sequela (Left) 04/01/2014   Morbid obesity with BMI of 50.0-59.9, adult ([place]) 04/01/2014   HTN (hypertension) 04/01/2014   Multiple rib fractures 03/27/2014    Orientation RESPIRATION BLADDER Height & Weight     Self, Time, Place, Situation  O2 (3L nasal cannula) Incontinent, External catheter Weight: (!) 400 lb 5.7 oz (181.6 kg) Height:  6\' 3"  (190.5 cm)  BEHAVIORAL SYMPTOMS/MOOD NEUROLOGICAL BOWEL NUTRITION STATUS      Continent Diet (see discharge summary)  AMBULATORY STATUS COMMUNICATION OF NEEDS Skin   Limited Assist Verbally Normal                       Personal Care Assistance Level of Assistance  Bathing, Feeding, Dressing, Total care Bathing Assistance: Limited assistance Feeding assistance: Independent Dressing Assistance: Limited assistance Total Care Assistance: Limited assistance   Functional Limitations Info  Sight, Hearing, Speech Sight Info: Adequate Hearing Info: Adequate Speech Info: Adequate    SPECIAL CARE FACTORS FREQUENCY  PT (By licensed PT), OT (By licensed OT)     PT Frequency: min 4x weekly OT Frequency: min 4x weekly            Contractures Contractures Info: Not present    Additional Factors Info  Code Status, Allergies Code Status Info: full Allergies Info: No Known Allergies           Current Medications (04/09/2021):  This is the current hospital active medication list Current Facility-Administered Medications  Medication Dose Route Frequency Provider Last Rate Last Admin   0.9 %  sodium chloride infusion  250 mL Intravenous PRN Mansy, Jan A, MD       acetaminophen (TYLENOL) tablet 650 mg  650 mg Oral Q4H PRN Mansy, Jan A, MD   650 mg at 04/09/21 0817   ALPRAZolam Duanne Moron) tablet 0.25 mg  0.25 mg Oral BID PRN Mansy, Jan A, MD       aspirin EC tablet 81 mg  81 mg Oral Daily Mansy, Jan A, MD   81 mg at 04/09/21 0817   budesonide (PULMICORT) nebulizer solution 0.25 mg  0.25 mg Nebulization BID [place]  Donnamarie Poag, DO   0.25 mg at 04/09/21 0751   carvedilol (COREG) tablet 6.25 mg  6.25 mg Oral Daily Mansy, Jan A, MD   6.25 mg at 04/09/21 0818   enoxaparin (LOVENOX) injection 90 mg  0.5 mg/kg Subcutaneous Q24H Mansy, Jan A, MD   90 mg at 04/08/21 2105   ferrous sulfate tablet 325 mg  325 mg Oral BID WC Mansy, Jan A, MD   325 mg at 04/09/21 0817   fluticasone (FLONASE) 50 MCG/ACT nasal spray 2 spray  2 spray Each Nare Daily Mansy, Jan A, MD       ipratropium-albuterol (DUONEB) 0.5-2.5 (3) MG/3ML nebulizer solution 3 mL  3 mL Nebulization BID [place] Donnamarie Poag, DO   3 mL at 04/09/21 0752   loratadine (CLARITIN) tablet 10 mg  10 mg Oral Daily Mansy, Jan A, MD   10 mg at 04/09/21 0818   losartan (COZAAR) tablet 25 mg  25 mg Oral Daily Mansy, Jan A, MD   25 mg at 04/09/21 0818   ondansetron (ZOFRAN) injection 4 mg  4 mg Intravenous Q6H PRN Mansy, Jan A, MD       pantoprazole (PROTONIX) EC tablet 40 mg  40 mg  Oral Daily Mansy, Jan A, MD   40 mg at 04/09/21 0818   potassium chloride (KLOR-CON M) CR tablet 10 mEq  10 mEq Oral Daily Mansy, Jan A, MD   10 mEq at 04/09/21 8916   senna-docusate (Senokot-S) tablet 2 tablet  2 tablet Oral BID [place] Eric J, DO   2 tablet at 04/09/21 0817   simvastatin (ZOCOR) tablet 10 mg  10 mg Oral q1800 Mansy, Jan A, MD   10 mg at 04/08/21 1643   sodium chloride (OCEAN) 0.65 % nasal spray 1 spray  1 spray Each Nare PRN Mansy, Jan A, MD       sodium chloride flush (NS) 0.9 % injection 3 mL  3 mL Intravenous Q12H Mansy, Jan A, MD   3 mL at 04/09/21 0818   sodium chloride flush (NS) 0.9 % injection 3 mL  3 mL Intravenous PRN Mansy, Jan A, MD       torsemide (DEMADEX) tablet 40 mg  40 mg Oral BID Buford Dresser, MD   40 mg at 04/09/21 9450     Discharge Medications: Please see discharge summary for a list of discharge medications.  Relevant Imaging Results:  Relevant Lab Results:   Additional Information SSN:[id number]  Alberteen Sam, LCSW

## 2021-04-09 NOTE — Progress Notes (Signed)
Progress Note  Patient Name: Gary [name] Date of Encounter: 04/09/2021  [facility] Cardiologist: Ida Rogue, MD   Subjective   No acute events overnight. Feels that breathing is stable. Made good urine on torsemide.  Inpatient Medications    Scheduled Meds:  aspirin EC  81 mg Oral Daily   budesonide  0.25 mg Nebulization BID   carvedilol  6.25 mg Oral Daily   enoxaparin (LOVENOX) injection  0.5 mg/kg Subcutaneous Q24H   ferrous sulfate  325 mg Oral BID WC   fluticasone  2 spray Each Nare Daily   ipratropium-albuterol  3 mL Nebulization BID   loratadine  10 mg Oral Daily   losartan  25 mg Oral Daily   pantoprazole  40 mg Oral Daily   potassium chloride  10 mEq Oral Daily   senna-docusate  2 tablet Oral BID   simvastatin  10 mg Oral q1800   sodium chloride flush  3 mL Intravenous Q12H   torsemide  40 mg Oral BID   Continuous Infusions:  sodium chloride     PRN Meds: sodium chloride, acetaminophen, ALPRAZolam, ondansetron (ZOFRAN) IV, sodium chloride, sodium chloride flush   Vital Signs    Vitals:   04/09/21 0051 04/09/21 0514 04/09/21 0748 04/09/21 1123  BP: 107/79 (!) 129/94 (!) 99/59 111/72  Pulse: 84 85 88 80  Resp: 20 20 18 18   Temp: 99 F (37.2 C) 98.3 F (36.8 C) 98.5 F (36.9 C) 97.9 F (36.6 C)  TempSrc: Axillary Axillary  Oral  SpO2: 98% 97% 93% 95%  Weight:  (!) 181.6 kg    Height:        Intake/Output Summary (Last 24 hours) at 04/09/2021 1328 Last data filed at 04/09/2021 0954 Gross per 24 hour  Intake 1320 ml  Output 2050 ml  Net -730 ml   Last 3 Weights 04/09/2021 04/07/2021 04/06/2021  Weight (lbs) 400 lb 5.7 oz 406 lb 15.5 oz 416 lb 0.1 oz  Weight (kg) 181.6 kg 184.6 kg 188.7 kg      Telemetry    NSR with rare PVCs - Personally Reviewed  ECG    No new since 04/06/21 - Personally Reviewed  Physical Exam   GEN: Well nourished, well developed in no acute distress NECK: No JVD appreciated, though body habitus  difficult CARDIAC: regular rhythm, normal S1 and S2, no rubs or gallops. No murmur. VASCULAR: Radial pulses 2+ bilaterally.  RESPIRATORY:  Clear to auscultation without rales, wheezing or rhonchi  ABDOMEN: Soft, non-tender, non-distended MUSCULOSKELETAL:  Moves all 4 limbs independently SKIN: Warm and dry, bilateral LE wrinkling with bilateral trace LE edema NEUROLOGIC:  No focal neuro deficits noted. PSYCHIATRIC:  Normal affect    Labs    High Sensitivity Troponin:   Recent Labs  Lab 04/04/21 1539 04/04/21 1725 04/05/21 0446 04/06/21 0435 04/06/21 1752  TROPONINIHS 8 10 11 10 11      Chemistry Recent Labs  Lab 04/04/21 1725 04/04/21 2021 04/07/21 0339 04/08/21 0650 04/09/21 0500  NA 145   < > 140 137 139  K 4.5   < > 4.0 4.0 4.3  CL 100   < > 96* 96* 96*  CO2 36*   < > 36* 37* 35*  GLUCOSE 82   < > 86 95 93  BUN 16   < > 22 22 24*  CREATININE 0.92   < > 1.16 1.18 1.16  CALCIUM 9.3   < > 8.3* 8.1* 8.5*  PROT 9.1*  --   --   --   --  ALBUMIN 4.1  --   --   --   --   AST 16  --   --   --   --   ALT 19  --   --   --   --   ALKPHOS 119  --   --   --   --   BILITOT 0.8  --   --   --   --   GFRNONAA >60   < > >60 >60 >60  ANIONGAP 9   < > 8 4* 8   < > = values in this interval not displayed.    Lipids No results for input(s): CHOL, TRIG, HDL, LABVLDL, LDLCALC, CHOLHDL in the last 168 hours.  Hematology Recent Labs  Lab 04/04/21 1539 04/04/21 2021 04/08/21 0650  WBC 7.8 8.8 7.3  RBC 6.20* 5.79 5.20  HGB 14.6 13.7 12.3*  HCT 56.1* 53.2* 45.3  MCV 90.5 91.9 87.1  MCH 23.5* 23.7* 23.7*  MCHC 26.0* 25.8* 27.2*  RDW 20.1* 19.9* 19.1*  PLT 158 177 116*   Thyroid No results for input(s): TSH, FREET4 in the last 168 hours.  BNP Recent Labs  Lab 04/04/21 1725 04/07/21 0339  BNP 239.7* 31.7    DDimer No results for input(s): DDIMER in the last 168 hours.   Radiology    No results found.  Cardiac Studies   Echo  1. Left ventricular ejection fraction, by  estimation, is 60 to 65%. The  left ventricle has normal function. The left ventricle has no regional  wall motion abnormalities. Left ventricular diastolic parameters are  consistent with Grade III diastolic  dysfunction (restrictive).   2. Right ventricular systolic function is normal. The right ventricular  size is normal.   3. Left atrial size was mildly dilated.   4. The mitral valve is normal in structure. Trivial mitral valve  regurgitation. No evidence of mitral stenosis.   5. The aortic valve is normal in structure. Aortic valve regurgitation is  not visualized. No aortic stenosis is present.   6. The inferior vena cava is normal in size with greater than 50%  respiratory variability, suggesting right atrial pressure of 3 mmHg.   Patient Profile     65 y.o. male with a history of HFpEF, hypertension, morbid obesity, sleep apnea, tobacco abuse, recent COVID-19 infection with respiratory failure, COPD, gastric ulcers, and prior GI bleeds, who is being seen today for the evaluation of acute on chronic diastolic heart failure  Assessment & Plan    Acute diastolic heart failure Restrictive filling/grade 3 diastolic dysfunction COPD Obesity hypoventilation syndrome, history of OSA History of Covid pneumonia 02/2021 Acute on chronic respiratory failure with hypoxia and hypercapnia, on home O2 -on echo, RAP was only 3 -weight today 181.6 kg, admission weight 187.1 kg -charted net negative 5.9 L, but there is uncharted urine -avoid bradycardia given restrictive filling (current carvedilol dose acceptable). Would like more afterload reduction, but hypotension limits this. Currently on losartan 25 mg daily. Listed as also being on bisoprolol at home prior to admission, would make sure this does not get restarted -renal function stable today, Cr 1.16 -had good urine output on home torsemide dose. Discussed recommended fluid intake again -has orders for trilogy machine, home O2 at home.  This may be his biggest barrier to discharge   PAD: -continue aspirin, statin -ticagrelor appears to be historical med, would make sure this is not restarted at discharge (history of GI bleeding, no recent procedures)  [facility]  will sign off.   Medication Recommendations:   Continue current inpatient medications: torsemide 40 mg BID, aspirin 81 mg daily, carvedilol 6.25 mg BID, losartan 25 mg daily, simvastatin 10 mg daily, potassium 10 mEq daily  Would make sure he is NOT discharged on bisoprolol or brilinta  Other recommendations (labs, testing, etc):  none Follow up as an outpatient:  He has follow up with the HF clinic at [facility] on 04/24/21  For questions or updates, please contact [place] Please consult [email or web address] for contact info under        Signed, Buford Dresser, MD  04/09/2021, 1:28 PM

## 2021-04-10 MED ORDER — SODIUM CHLORIDE 0.9% FLUSH: 10.0000 mL | Freq: Two times a day (BID) | INTRAVENOUS | Status: DC

## 2021-04-10 NOTE — Progress Notes (Signed)
Occupational Therapy Treatment Patient Details Name: Gary [name] MRN: [id number] DOB: [birth year] Today's Date: 04/10/2021   History of present illness Gary [name] is a 65 y.o. African-American male with medical history significant for CHF with grade 3 diastolic dysfunction, COPD, hypertension, PVD, COPD, COVID-19, and OSA who was supposed to be on CPAP however he does not have it yet, who presented to the ER with acute onset of worsening dyspnea over the last couple of days with associated dry cough and wheezing.   OT comments  Pt in bed on arrival and declines OOB with therapist.  Pt was agreeable to instruction on energy conservation techniques for implementation in self care and IADL routine.  Pt educated on principles and issued written/pictorial handout with information.  Pt demonstrating understanding.  Also attempted to instruct pt on UE ROM/strengthening from bed level but he did not participate in exercises actively.  Therapist demonstrated each exercise.  Pt asking when he can discharge and states, "I can wait on a rehab bed from home just as well as I can here."  Pt also states he has a funeral for his brother in law on Thursday and would like to attend.  Continue towards goals in plan of care to maximize safety and independence in necessary daily tasks.     Recommendations for follow up therapy are one component of a multi-disciplinary discharge planning process, led by the attending physician.  Recommendations may be updated based on patient status, additional functional criteria and insurance authorization.    Follow Up Recommendations  Skilled nursing-short term rehab (<3 hours/day)    Assistance Recommended at Discharge Frequent or constant Supervision/Assistance  Equipment Recommendations       Recommendations for Other Services      Precautions / Restrictions Precautions Precautions: Fall       Mobility Bed Mobility                    Transfers                          Balance                                           ADL either performed or assessed with clinical judgement   ADL Overall ADL's : Needs assistance/impaired                                       General ADL Comments: Pt declines out of bed tasks this date, pt educated on energy conservation techniques for self care and IADL tasks and issued written/pictorial handout for home reference. Pt able to demonstrate understanding of principles verbally.    Extremity/Trunk Assessment              Vision       Perception     Praxis      Cognition Arousal/Alertness: Awake/alert Behavior During Therapy: WFL for tasks assessed/performed Overall Cognitive Status: Within Functional Limits for tasks assessed                                            Exercises Other Exercises Other Exercises: Attempted  UE exercises for ROM and strengthening however pt declined active participation with exercises.  Therapist demonstrated and instructed pt on towel exercises for ROM as well as a towel knotting exercise for hand strength and encouraged patient to perform daily to help maintain his ROM and promote active movement to assist in recovery.  Pt demonstrates understanding.   Shoulder Instructions       General Comments      Pertinent Vitals/ Pain       Pain Assessment: No/denies pain Pain Score: 0-No pain  Home Living                                          Prior Functioning/Environment              Frequency  Min 2X/week        Progress Toward Goals  OT Goals(current goals can now be found in the care plan section)  Progress towards OT goals: Progressing toward goals  Acute Rehab OT Goals Patient Stated Goal: to take care of self OT Goal Formulation: With patient Time For Goal Achievement: 04/20/21 Potential to Achieve Goals: Good  Plan Discharge plan remains appropriate     Co-evaluation                 AM-PAC OT "6 Clicks" Daily Activity     Outcome Measure   Help from another person eating meals?: None Help from another person taking care of personal grooming?: A Little Help from another person toileting, which includes using toliet, bedpan, or urinal?: A Lot Help from another person bathing (including washing, rinsing, drying)?: A Lot Help from another person to put on and taking off regular upper body clothing?: A Lot Help from another person to put on and taking off regular lower body clothing?: Total 6 Click Score: 14    End of Session    OT Visit Diagnosis: Unsteadiness on feet (R26.81);Muscle weakness (generalized) (M62.81)   Activity Tolerance     Patient Left in bed;with call bell/phone within reach;with bed alarm set   Nurse Communication          Time: 1324-4010 OT Time Calculation (min): 23 min  Charges: OT General Charges $OT Visit: 1 Visit OT Treatments $Self Care/Home Management : 8-22 mins Gary [name] T [name], OTR/L, CLT   Gary [name] 04/10/2021, 2:00 PM

## 2021-04-10 NOTE — TOC Progression Note (Signed)
Transition of Care [facility]) - Progression Note    Patient Details  Name: Gary [name] MRN: [id number] Date of Birth: 08-Aug-1955  Transition of Care [facility]) CM/SW [place], Page Phone Number: 04/10/2021, 8:59 AM  Clinical Narrative:     CSW notes no bed offers yet for SNF, CSW will continue to follow up. Holiday weekend potential barrier in lack of bed offers at this time.   Expected Discharge Plan: [place] Barriers to Discharge: Continued Medical Work up  Expected Discharge Plan and Services Expected Discharge Plan: [place] Choice:  (TBD) Living arrangements for the past 2 months: Single Family Home                                       Social Determinants of Health (SDOH) Interventions    Readmission Risk Interventions Readmission Risk Prevention Plan 04/07/2021 03/12/2021 03/01/2020  Transportation Screening Complete Complete Complete  PCP or Specialist Appt within 3-5 Days Complete Complete Not Complete  Not Complete comments - - To be scheduled by unit secretary  [place] or [place] - Complete Complete  Social Work Consult for [place] Planning/Counseling - Complete Complete  Palliative Care Screening Not Applicable Not Applicable Not Applicable  Medication Review Press photographer) Complete Complete Complete  Some recent data might be hidden

## 2021-04-10 NOTE — Progress Notes (Signed)
PROGRESS NOTE    Gary [name]  MBW:[id number] DOB: [birth year] DOA: 04/04/2021 PCP: [facility] [place]    Brief Narrative:   65 year old male with past medical history significant for chronic diastolic congestive heart failure, COPD, essential hypertension, PVD, obstructive sleep apnea, recent COVID-19 viral pneumonia who presents to [facility] ED on 12/20 via EMS for shortness of breath.  On EMS arrival patient was noted to be hypoxic with SPO2's in the 50s on 2 L nasal cannula and was placed on CPAP.   Recent admission for COVID-19 viral pneumonia with respiratory failure and ARDS requiring intubation on 02/21/2021 and transferred to [facility] for further management.  Patient was subsequently extubated on 02/26/2021 with gradual improvement and transferred back to [facility] on 03/11/2021.  Patient was discharge to home with home health on 03/21/2021.   In the ED, temperature 98.1 F, HR 78, BP 124/71, respiratory rate 27, SPO2 76% on CPAP which was subsequently transitioned to BiPAP.  Sodium 145, potassium 4.5, chloride 100, CO2 36, glucose 82, BUN 16, creatinine 0.92.  BNP 239.7, high sensitive troponin 8>10>11; within normal limits.  WBC 7.8, hemoglobin 14.6, platelets 158.  Covid-19 PCR negative peer influenza A/B PCR negative.  Chest x-ray with changes of mild CHF with new bibasilar atelectasis.  EKG with sinus rhythm, rate 80 with poor R wave progression.  EDP administered 60 mg IV Lasix and continued on BiPAP.  Hospital service consulted for further evaluation and management of decompensated congestive heart failure exacerbation with acute on chronic hypoxic respiratory failure.  Assessment & Plan:   Principal Problem:   Acute CHF (congestive heart failure) ([name]) Active Problems:   COPD (chronic obstructive pulmonary disease) ([name])   Acute on chronic respiratory failure ([name])   Hyperlipidemia  Acute on chronic hypoxic respiratory failure, POA At baseline, patient  on 2 L nasal cannula.   On EMS arrival was found to be oxygenating in the 50s on 2 L and subsequently placed on BiPAP.   Suspect etiology multifactorial in the setting of decompensated CHF and likely noncompliance with home CPAP/BiPAP. -Patient inquiring about home BiPAP.  Informed by pulmonary the patient has a trilogy NIV at home capable of BiPAP, CPAP.  Suspect nonadherence to therapy and perhaps poor comprehension Plan: Patient back on 2 L.  Continue treatment as below   Chronic diastolic congestive heart failure, decompensated Patient presenting with progressive shortness of breath, found to be hypoxic with SPO2 in the 50s on his home 2 L nasal cannula.   Etiology likely secondary to decompensated CHF with elevated BNP and vascular congestion on chest x-ray. --Cardiology following, appreciate assistance -- Continues with net negative fluid balance Plan: No further IV diuretics Continue torsemide 40 mg twice daily per home dose Monitor electrolytes, daily weights, ins and outs   Hx OSA Discussed with pulmonary Patient has a trilogy NIV at home Suspect noncompliance Not a candidate for home BiPAP at this time as the trilogy will have this capability Plan: BiPAP nightly and during naps Titrate supplemental oxygen to maintain SPO2 greater than 88% Patient agreeable short-term rehab placement Can resume home trilogy at time of discharge Outpatient follow-up with pulmonology   COPD on home oxygen 2-3 L nasal cannula --Pulmicort neb twice daily --duonebs --Titrate supplemental oxygen to maintain SPO2 greater than 88%   Essential hypertension --Carvedilol 6.25 mg p.o. daily --Losartan 25 mg p.o. daily   Peripheral vascular disease --Continue aspirin and statin   Iron deficiency anemia --Ferrous sulfate 325 mg p.o.  twice daily   GERD: Protonix 40 mg p.o. daily   Anxiety: Xanax 0.25 mg p.o. twice daily as needed   Hx of recent COVID-19 viral pneumonia  Patient with recent  hospitalization with COVID-19 viral pneumonia and subsequent ARDS requiring ventilatory support.  COVID-19/influenza A/B PCR negative on this admission. --Continue supportive care as above, oxygen   Morbid obesity Body mass index is 50.87 kg/m.  Discussed with patient needs for aggressive lifestyle changes/weight loss as this complicates all facets of care.  Outpatient follow-up with PCP.  May benefit from bariatric evaluation outpatient.   Weakness/debility/deconditioning: -- Therapy recommending skilled nursing facility --Will consult TOC for evaluation as likely would benefit from SNF placement given his recurrent hospitalizations and physical decline --Patient seemed agreeable to bed search   DVT prophylaxis: SQ Lovenox Code Status: Full Family Communication: None today Disposition Plan: Status is: Inpatient  Remains inpatient appropriate because: Unsafe discharge plan.  Patient back on oral diuretics.  Medically stable for discharge at this time.  Pending SNF placement      Level of care: Progressive  Consultants:  Cardiology  Procedures:  None  Antimicrobials: None   Subjective: Seen and examined.  Resting comfortably in bed.  No visible distress.  No pain complaints  Objective: Vitals:   04/09/21 2331 04/10/21 0637 04/10/21 0751 04/10/21 0857  BP: 111/71 107/67 106/61   Pulse: 85 85 87   Resp: 20 18 18    Temp: 98.3 F (36.8 C) 98.3 F (36.8 C) 98.2 F (36.8 C)   TempSrc:      SpO2: 98% 93% 93% 93%  Weight:      Height:        Intake/Output Summary (Last 24 hours) at 04/10/2021 1049 Last data filed at 04/10/2021 0900 Gross per 24 hour  Intake 720 ml  Output 2650 ml  Net -1930 ml   Filed Weights   04/06/21 0500 04/07/21 0346 04/09/21 0514  Weight: (!) 188.7 kg (!) 184.6 kg (!) 181.6 kg    Examination:  General exam: No apparent distress Respiratory system: Decreased at bases.  Normal work of breathing.  2 L Cardiovascular system: S1-S2, RRR,  no murmurs, no pedal edema Gastrointestinal system: Soft, NT/ND, normal bowel sounds Central nervous system: Alert and oriented. No focal neurological deficits. Extremities: Symmetric 5 x 5 power. Skin: No rashes, lesions or ulcers Psychiatry: Judgement and insight appear impaired. Mood & affect flattened.     Data Reviewed: I have personally reviewed following labs and imaging studies  CBC: Recent Labs  Lab 04/04/21 1539 04/04/21 2021 04/08/21 0650  WBC 7.8 8.8 7.3  HGB 14.6 13.7 12.3*  HCT 56.1* 53.2* 45.3  MCV 90.5 91.9 87.1  PLT 158 177 409*   Basic Metabolic Panel: Recent Labs  Lab 04/05/21 0950 04/06/21 0435 04/07/21 0339 04/08/21 0650 04/09/21 0500  NA 144 140 140 137 139  K 4.8 4.1 4.0 4.0 4.3  CL 99 97* 96* 96* 96*  CO2 40* 35* 36* 37* 35*  GLUCOSE 73 110* 86 95 93  BUN 19 23 22 22  24*  CREATININE 1.02 1.17 1.16 1.18 1.16  CALCIUM 8.5* 8.3* 8.3* 8.1* 8.5*   GFR: Estimated Creatinine Clearance: 113.7 mL/min (by C-G formula based on SCr of 1.16 mg/dL). Liver Function Tests: Recent Labs  Lab 04/04/21 1725  AST 16  ALT 19  ALKPHOS 119  BILITOT 0.8  PROT 9.1*  ALBUMIN 4.1   No results for input(s): LIPASE, AMYLASE in the last 168 hours. No results for  input(s): AMMONIA in the last 168 hours. Coagulation Profile: No results for input(s): INR, PROTIME in the last 168 hours. Cardiac Enzymes: No results for input(s): CKTOTAL, CKMB, CKMBINDEX, TROPONINI in the last 168 hours. BNP (last 3 results) No results for input(s): PROBNP in the last 8760 hours. HbA1C: No results for input(s): HGBA1C in the last 72 hours. CBG: Recent Labs  Lab 04/04/21 2229 04/04/21 2340  GLUCAP 69* 93   Lipid Profile: No results for input(s): CHOL, HDL, LDLCALC, TRIG, CHOLHDL, LDLDIRECT in the last 72 hours. Thyroid Function Tests: No results for input(s): TSH, T4TOTAL, FREET4, T3FREE, THYROIDAB in the last 72 hours. Anemia Panel: No results for input(s): VITAMINB12,  FOLATE, FERRITIN, TIBC, IRON, RETICCTPCT in the last 72 hours. Sepsis Labs: No results for input(s): PROCALCITON, LATICACIDVEN in the last 168 hours.  Recent Results (from the past 240 hour(s))  Resp Panel by RT-PCR (Flu A&B, Covid) Nasopharyngeal Swab     Status: None   Collection Time: 04/04/21  3:39 PM   Specimen: Nasopharyngeal Swab; Nasopharyngeal(NP) swabs in vial transport medium  Result Value Ref Range Status   SARS Coronavirus 2 by RT PCR NEGATIVE NEGATIVE Final    Comment: (NOTE) SARS-CoV-2 target nucleic acids are NOT DETECTED.  The SARS-CoV-2 RNA is generally detectable in upper respiratory specimens during the acute phase of infection. The lowest concentration of SARS-CoV-2 viral copies this assay can detect is 138 copies/mL. A negative result does not preclude SARS-Cov-2 infection and should not be used as the sole basis for treatment or other patient management decisions. A negative result may occur with  improper specimen collection/handling, submission of specimen other than nasopharyngeal swab, presence of viral mutation(s) within the areas targeted by this assay, and inadequate number of viral copies(<138 copies/mL). A negative result must be combined with clinical observations, patient history, and epidemiological information. The expected result is Negative.  Fact Sheet for Patients:  EntrepreneurPulse.com.au  Fact Sheet for Healthcare Providers:  IncredibleEmployment.be  This test is no t yet approved or cleared by the [place] FDA and  has been authorized for detection and/or diagnosis of SARS-CoV-2 by FDA under an Emergency Use Authorization (EUA). This EUA will remain  in effect (meaning this test can be used) for the duration of the COVID-19 declaration under Section 564(b)(1) of the Act, 21 U.S.C.section 360bbb-3(b)(1), unless the authorization is terminated  or revoked sooner.       Influenza A by PCR  NEGATIVE NEGATIVE Final   Influenza B by PCR NEGATIVE NEGATIVE Final    Comment: (NOTE) The Xpert Xpress SARS-CoV-2/FLU/RSV plus assay is intended as an aid in the diagnosis of influenza from Nasopharyngeal swab specimens and should not be used as a sole basis for treatment. Nasal washings and aspirates are unacceptable for Xpert Xpress SARS-CoV-2/FLU/RSV testing.  Fact Sheet for Patients: EntrepreneurPulse.com.au  Fact Sheet for Healthcare Providers: IncredibleEmployment.be  This test is not yet approved or cleared by the [place] FDA and has been authorized for detection and/or diagnosis of SARS-CoV-2 by FDA under an Emergency Use Authorization (EUA). This EUA will remain in effect (meaning this test can be used) for the duration of the COVID-19 declaration under Section 564(b)(1) of the Act, 21 U.S.C. section 360bbb-3(b)(1), unless the authorization is terminated or revoked.  Performed at [facility], [address] [place], [place] [address]   MRSA Next Gen by PCR, Nasal     Status: None   Collection Time: 04/04/21 10:29 PM   Specimen: Nasal Mucosa; Nasal Swab  Result Value Ref Range Status   MRSA by PCR Next Gen NOT DETECTED NOT DETECTED Final    Comment: (NOTE) The GeneXpert MRSA Assay (FDA approved for NASAL specimens only), is one component of a comprehensive MRSA colonization surveillance program. It is not intended to diagnose MRSA infection nor to guide or monitor treatment for MRSA infections. Test performance is not FDA approved in patients less than 23 years old. Performed at [facility], [address] [place], [place] [address]          Radiology Studies: No results found.      Scheduled Meds:  aspirin EC  81 mg Oral Daily   budesonide  0.25 mg Nebulization BID   carvedilol  6.25 mg Oral Daily   enoxaparin (LOVENOX) injection  0.5 mg/kg Subcutaneous Q24H   ferrous sulfate  325 mg  Oral BID WC   fluticasone  2 spray Each Nare Daily   ipratropium-albuterol  3 mL Nebulization BID   loratadine  10 mg Oral Daily   losartan  25 mg Oral Daily   pantoprazole  40 mg Oral Daily   potassium chloride  10 mEq Oral Daily   senna-docusate  2 tablet Oral BID   simvastatin  10 mg Oral q1800   sodium chloride flush  3 mL Intravenous Q12H   torsemide  40 mg Oral BID   Continuous Infusions:  sodium chloride       LOS: 6 days    Time spent: 15 minutes    Sidney Ace, MD [facility]   If 7PM-7AM, please contact night-coverage  04/10/2021, 10:49 AM

## 2021-04-11 LAB — CBC
HCT: 48.1 % (ref 39.0–52.0)
Hemoglobin: 13 g/dL (ref 13.0–17.0)
MCH: 23.7 pg — ABNORMAL LOW (ref 26.0–34.0)
MCHC: 27 g/dL — ABNORMAL LOW (ref 30.0–36.0)
MCV: 87.6 fL (ref 80.0–100.0)
Platelets: 117 10*3/uL — ABNORMAL LOW (ref 150–400)
RBC: 5.49 MIL/uL (ref 4.22–5.81)
RDW: 18.3 % — ABNORMAL HIGH (ref 11.5–15.5)
WBC: 6.1 10*3/uL (ref 4.0–10.5)
nRBC: 0 % (ref 0.0–0.2)

## 2021-04-11 LAB — CREATININE, SERUM
Creatinine, Ser: 1.13 mg/dL (ref 0.61–1.24)
GFR, Estimated: >60 mL/min (ref >60)

## 2021-04-11 MED ORDER — BUDESONIDE 0.25 MG/2ML IN SUSP: 0.2500 mg | Freq: Two times a day (BID) | RESPIRATORY_TRACT | 0 refills | Status: DC

## 2021-04-11 NOTE — TOC Progression Note (Signed)
Transition of Care [facility]) - Progression Note    Patient Details  Name: Gary [name] MRN: [id number] Date of Birth: 1955/12/15  [facility] CM/SW Contact  Eileen Stanford, LCSW Phone Number: 04/11/2021, 1:09 PM  Clinical Narrative:  CSW unable to secure [facility] for pt due to insurance not covering [place] services,=. Also no bed offers at this time.     Expected Discharge Plan: [place] Barriers to Discharge: Continued Medical Work up  Expected Discharge Plan and Services Expected Discharge Plan: [place] Choice:  (TBD) Living arrangements for the past 2 months: Single Family Home                                       Social Determinants of Health (SDOH) Interventions    Readmission Risk Interventions Readmission Risk Prevention Plan 04/07/2021 03/12/2021 03/01/2020  Transportation Screening Complete Complete Complete  PCP or Specialist Appt within 3-5 Days Complete Complete Not Complete  Not Complete comments - - To be scheduled by unit secretary  [place] or [place] - Complete Complete  Social Work Consult for [place] Planning/Counseling - Complete Complete  Palliative Care Screening Not Applicable Not Applicable Not Applicable  Medication Review Press photographer) Complete Complete Complete  Some recent data might be hidden

## 2021-04-11 NOTE — Progress Notes (Signed)
Physical Therapy Treatment Patient Details Name: Gary [name] MRN: [id number] DOB: 02-Oct-1955 Today's Date: 04/11/2021   History of Present Illness Gary [name] is a 65 y.o. African-American male with medical history significant for CHF with grade 3 diastolic dysfunction, COPD, hypertension, PVD, COPD, COVID-19, and OSA who was supposed to be on CPAP however he does not have it yet, who presented to the ER with acute onset of worsening dyspnea over the last couple of days with associated dry cough and wheezing.    PT Comments    Pt refused PT this AM, however knows he has not done much since arrival and is eager to prove that he is safe to go home and therefore willing to do some activity this afternoon.  He was on 4L of O2 t/o the session and sats did appear to stay in the 90-94 range.  He was able to ambulate 35 ft x 2 with seated rest break in between.  Pt unable to rise from standard height bed/recliner but with assist/elevation he could attain standing with little to no assist (again needing elevated surface or heavy assist to rise from lower height.)  Pt assures me his home is set up (funiture, etc) to allow him to avoid low surfaces and that this is essentially his baseline.   Recommendations for follow up therapy are one component of a multi-disciplinary discharge planning process, led by the attending physician.  Recommendations may be updated based on patient status, additional functional criteria and insurance authorization.  Follow Up Recommendations  Home health PT     Assistance Recommended at Discharge    Equipment Recommendations       Recommendations for Other Services       Precautions / Restrictions Precautions Precautions: Fall Restrictions Weight Bearing Restrictions: No     Mobility  Bed Mobility Overal bed mobility: Modified Independent Bed Mobility: Supine to Sit Rolling: Supervision Sidelying to sit: Supervision;HOB elevated             Transfers Overall transfer level: Needs assistance Equipment used: Rolling walker (2 wheels) Transfers: Sit to/from Stand Sit to Stand: Min guard;From elevated surface (>6" of elevation from standard height bed)           General transfer comment: Pt did need mod assist to rise from recliner height, placed pillows under to allow for increased height and ease of transfer    Ambulation/Gait Ambulation/Gait assistance: Supervision Gait Distance (Feet): 35 Feet Assistive device: Rolling walker (2 wheels)         General Gait Details: Pt was able to walk to/from door X 2 with seated rest break between.  Pt's O2 remained in the 90s on room air.  Pt endorsed not wanting to do more, but claims high motivatation to keep doing more and more to get stronger at home once he goes home...   Stairs             Wheelchair Mobility    Modified Rankin (Stroke Patients Only)       Balance Overall balance assessment: Needs assistance Sitting-balance support: Feet supported Sitting balance-[name] Scale: Good     Standing balance support: During functional activity;Bilateral upper extremity supported;Reliant on assistive device for balance Standing balance-[name] Scale: Fair Standing balance comment: Relies on BUE support on BRW to maintain standing and to offload LLE.                            Cognition  Arousal/Alertness: Awake/alert Behavior During Therapy: WFL for tasks assessed/performed Overall Cognitive Status: Within Functional Limits for tasks assessed                                          Exercises      General Comments        Pertinent Vitals/Pain Pain Assessment: No/denies pain Pain Score: 5  Pain Location: L foot    Home Living                          Prior Function            PT Goals (current goals can now be found in the care plan section) Progress towards PT goals: Progressing toward goals     Frequency    Min 2X/week      PT Plan Current plan remains appropriate    Co-evaluation              AM-PAC PT "6 Clicks" Mobility   Outcome Measure  Help needed turning from your back to your side while in a flat bed without using bedrails?: A Little Help needed moving from lying on your back to sitting on the side of a flat bed without using bedrails?: A Lot Help needed moving to and from a bed to a chair (including a wheelchair)?: A Little Help needed standing up from a chair using your arms (e.g., wheelchair or bedside chair)?: A Little Help needed to walk in hospital room?: A Little Help needed climbing 3-5 steps with a railing? : Total 6 Click Score: 15    End of Session Equipment Utilized During Treatment: Gait belt;Oxygen (4L) Activity Tolerance: Patient limited by fatigue;Patient tolerated treatment well Patient left: with chair alarm set;with call bell/phone within reach Nurse Communication: Mobility status PT Visit Diagnosis: Other abnormalities of gait and mobility (R26.89);Muscle weakness (generalized) (M62.81);Difficulty in walking, not elsewhere classified (R26.2);Unsteadiness on feet (R26.81)     Time: 9833-8250 PT Time Calculation (min) (ACUTE ONLY): 29 min  Charges:  $Gait Training: 8-22 mins $Therapeutic Activity: 8-22 mins                     Kreg Shropshire, DPT 04/11/2021, 3:38 PM

## 2021-04-11 NOTE — Discharge Summary (Addendum)
Physician Discharge Summary  LOUDON KRAKOW QHU:[id number] DOB: 28-Jun-1955 DOA: 04/04/2021  PCP: [facility] date: 04/04/2021 Discharge date: 04/11/2021  Admitted From: Home Disposition: Home  Recommendations for Outpatient Follow-up:  Follow up with PCP in 1-2 weeks Follow-up with pulmonology as directed  Home Health: No Equipment/Devices: None  Discharge Condition: Stable CODE STATUS: Full Diet recommendation: Low-sodium  Brief/Interim Summary:  65 year old male with past medical history significant for chronic diastolic congestive heart failure, COPD, essential hypertension, PVD, obstructive sleep apnea, recent COVID-19 viral pneumonia who presents to [facility] ED on 12/20 via EMS for shortness of breath.  On EMS arrival patient was noted to be hypoxic with SPO2's in the 50s on 2 L nasal cannula and was placed on CPAP.   Recent admission for COVID-19 viral pneumonia with respiratory failure and ARDS requiring intubation on 02/21/2021 and transferred to [facility] for further management.  Patient was subsequently extubated on 02/26/2021 with gradual improvement and transferred back to [facility] on 03/11/2021.  Patient was discharge to home with home health on 03/21/2021.   In the ED, temperature 98.1 F, HR 78, BP 124/71, respiratory rate 27, SPO2 76% on CPAP which was subsequently transitioned to BiPAP.  Sodium 145, potassium 4.5, chloride 100, CO2 36, glucose 82, BUN 16, creatinine 0.92.  BNP 239.7, high sensitive troponin 8>10>11; within normal limits.  WBC 7.8, hemoglobin 14.6, platelets 158.  Covid-19 PCR negative peer influenza A/B PCR negative.  Chest x-ray with changes of mild CHF with new bibasilar atelectasis.  EKG with sinus rhythm, rate 80 with poor R wave progression.  EDP administered 60 mg IV Lasix and continued on BiPAP.  Hospital service consulted for further evaluation and management of decompensated congestive heart failure exacerbation  with acute on chronic hypoxic respiratory failure.     Discharge Diagnoses:  Principal Problem:   Acute CHF (congestive heart failure) ([name]) Active Problems:   COPD (chronic obstructive pulmonary disease) ([name])   Acute on chronic respiratory failure ([name])   Hyperlipidemia  Acute on chronic hypoxic respiratory failure, POA At baseline, patient on 2 L nasal cannula.   On EMS arrival was found to be oxygenating in the 50s on 2 L and subsequently placed on BiPAP.   Suspect etiology multifactorial in the setting of decompensated CHF and likely noncompliance with home CPAP/BiPAP. -Patient inquiring about home BiPAP.  Informed by pulmonary the patient has a trilogy NIV at home capable of BiPAP, CPAP.  Suspect nonadherence to therapy and perhaps poor comprehension Plan: Patient back on baseline 2 L oxygen.  Lengthy discussion on the day of discharge regarding critical need to adhere to home a CPAP/BiPAP regimen.  Patient informed he has a trilogy machine which has BiPAP capability.  He is encouraged to reach out to his [employer] rep if he needs further instruction or clarification.  He can also call his pulmonologist office.   Chronic diastolic congestive heart failure, decompensated Patient presenting with progressive shortness of breath, found to be hypoxic with SPO2 in the 50s on his home 2 L nasal cannula.   Etiology likely secondary to decompensated CHF with elevated BNP and vascular congestion on chest x-ray. --Cardiology following, appreciate assistance -- Continues with net negative fluid balance Plan: No further IV diuretics Continue torsemide 40 mg twice daily per home dose Outpatient follow-up with cardiology     Hx OSA Discussed with pulmonary Patient has a trilogy NIV at home Suspect noncompliance Not a candidate for home BiPAP at this  time as the trilogy will have this capability Plan: Discharge home.  Can resume home trilogy  Peripheral arterial disease Continue  aspirin and statin.  Brilinta listed on medication reconciliation appears to be historical.  This has been discontinued at time of discharge  Discharge Instructions  Discharge Instructions     Diet - low sodium heart healthy   Complete by: As directed    Increase activity slowly   Complete by: As directed       Allergies as of 04/11/2021   No Known Allergies      Medication List     STOP taking these medications    bisoprolol 5 MG tablet Commonly known as: ZEBETA   Brilinta 90 MG Tabs tablet Generic drug: ticagrelor       TAKE these medications    acetaminophen 325 MG tablet Commonly known as: TYLENOL Take 2 tablets (650 mg total) by mouth every 6 (six) hours as needed for mild pain (or Fever >/= 101).   aspirin EC 81 MG tablet Take 81 mg by mouth daily. Swallow whole.   budesonide 0.25 MG/2ML nebulizer solution Commonly known as: PULMICORT Take 2 mLs (0.25 mg total) by nebulization 2 (two) times daily.   carvedilol 6.25 MG tablet Commonly known as: COREG Take 6.25 mg by mouth daily.   ferrous sulfate 325 (65 FE) MG tablet Take 1 tablet (325 mg total) by mouth 2 (two) times daily with a meal.   fluticasone 50 MCG/ACT nasal spray Commonly known as: FLONASE Place 2 sprays into both nostrils daily.   ipratropium-albuterol 0.5-2.5 (3) MG/3ML Soln Commonly known as: DUONEB Take 3 mLs by nebulization every 6 (six) hours.   loratadine 10 MG tablet Commonly known as: CLARITIN Take 1 tablet (10 mg total) by mouth daily.   losartan 25 MG tablet Commonly known as: COZAAR Take 25 mg by mouth daily.   pantoprazole 40 MG tablet Commonly known as: PROTONIX Take 1 tablet (40 mg total) by mouth daily.   potassium chloride 10 MEQ tablet Commonly known as: KLOR-CON M Take 1 tablet (10 mEq total) by mouth daily.   simvastatin 10 MG tablet Commonly known as: ZOCOR Take 1 tablet (10 mg total) by mouth daily at 6 PM.   sodium chloride 0.65 % Soln nasal  spray Commonly known as: OCEAN Place 1 spray into both nostrils as needed for congestion.   torsemide 20 MG tablet Commonly known as: DEMADEX Take 40 mg by mouth 2 (two) times daily. What changed: Another medication with the same name was removed. Continue taking this medication, and follow the directions you see here.        No Known Allergies  Consultations: Cardiology   Procedures/Studies: DG Chest Port 1 View  Result Date: 04/07/2021 CLINICAL DATA:  Shortness of breath. EXAM: PORTABLE CHEST 1 VIEW COMPARISON:  Chest x-ray from yesterday. FINDINGS: The patient is rotated to the right. Stable cardiomegaly. Unchanged pulmonary vascular congestion and mild interstitial thickening. New linear atelectasis in the right mid lung. No focal consolidation, pleural effusion, or pneumothorax. No acute osseous abnormality. IMPRESSION: 1. Unchanged mild congestive heart failure. Electronically Signed   By: Titus Dubin M.D.   On: 04/07/2021 08:07   DG Chest Port 1 View  Result Date: 04/06/2021 CLINICAL DATA:  Shortness of breath with chest pain EXAM: PORTABLE CHEST 1 VIEW COMPARISON:  04/04/2021 FINDINGS: Cardiac shadow is enlarged but stable. Vascular congestion is again identified. No focal confluent infiltrate or sizable effusion is seen. The previously seen bibasilar  atelectasis has cleared. No bony abnormality is noted. IMPRESSION: Stable mild CHF. Interval clearing of bibasilar atelectasis. Electronically Signed   By: [name] Catalina M.D.   On: 04/06/2021 18:12   DG Chest Portable 1 View  Result Date: 04/04/2021 CLINICAL DATA:  Shortness of breath EXAM: PORTABLE CHEST 1 VIEW COMPARISON:  03/17/2021 FINDINGS: Cardiac shadow is enlarged. The overall inspiratory effort is poor. Prominent central vascularity is noted without significant edema. These changes are stable from the prior exam. Bibasilar atelectatic changes are noted new from the prior study. No bony abnormality is seen.  IMPRESSION: Changes of mild CHF with new bibasilar atelectasis. Electronically Signed   By: [name] Catalina M.D.   On: 04/04/2021 16:22   DG Chest Port 1 View  Result Date: 03/17/2021 CLINICAL DATA:  Respiratory failure, unconscious, on BiPAP EXAM: PORTABLE CHEST 1 VIEW COMPARISON:  Portable exam 1031 hours compared to 02/22/2021 FINDINGS: Enlargement of cardiac silhouette with pulmonary vascular congestion. Subsegmental atelectasis LEFT mid lung. Additional patchy opacities at the lower lungs could represent atelectasis or infiltrate. No pleural effusion or pneumothorax. Osseous structures unremarkable. IMPRESSION: Bibasilar opacities question atelectasis versus infiltrate. Subsegmental atelectasis LEFT mid lung. Enlargement of cardiac silhouette with pulmonary vascular congestion. Electronically Signed   By: Lavonia Dana M.D.   On: 03/17/2021 10:44      Subjective: Seen and examined on the day of discharge.  Stable no distress.  Recommendations were for skilled nursing facility however patient is insistent on going home due to family matters that he needs to address outside the hospital.  Patient has decision-making capability and will be allowed to discharge home.  Unfortunately home health services cannot be obtained with patient's insurance.  Discharge Exam: Vitals:   04/11/21 1147 04/11/21 1552  BP: 108/65 107/70  Pulse: 83 83  Resp: 20 18  Temp: 97.7 F (36.5 C) 98.8 F (37.1 C)  SpO2: 92% 97%   Vitals:   04/11/21 0750 04/11/21 0900 04/11/21 1147 04/11/21 1552  BP: 122/64  108/65 107/70  Pulse: 84  83 83  Resp: 18  20 18   Temp: 98 F (36.7 C)  97.7 F (36.5 C) 98.8 F (37.1 C)  TempSrc:   Oral Oral  SpO2: 93% 92% 92% 97%  Weight:      Height:        General: Pt is alert, awake, not in acute distress Cardiovascular: RRR, S1/S2 +, no rubs, no gallops Respiratory: CTA bilaterally, no wheezing, no rhonchi Abdominal: Soft, NT, ND, bowel sounds + Extremities: no edema, no  cyanosis    The results of significant diagnostics from this hospitalization (including imaging, microbiology, ancillary and laboratory) are listed below for reference.     Microbiology: Recent Results (from the past 240 hour(s))  Resp Panel by RT-PCR (Flu A&B, Covid) Nasopharyngeal Swab     Status: None   Collection Time: 04/04/21  3:39 PM   Specimen: Nasopharyngeal Swab; Nasopharyngeal(NP) swabs in vial transport medium  Result Value Ref Range Status   SARS Coronavirus 2 by RT PCR NEGATIVE NEGATIVE Final    Comment: (NOTE) SARS-CoV-2 target nucleic acids are NOT DETECTED.  The SARS-CoV-2 RNA is generally detectable in upper respiratory specimens during the acute phase of infection. The lowest concentration of SARS-CoV-2 viral copies this assay can detect is 138 copies/mL. A negative result does not preclude SARS-Cov-2 infection and should not be used as the sole basis for treatment or other patient management decisions. A negative result may occur with  improper specimen collection/handling, submission  of specimen other than nasopharyngeal swab, presence of viral mutation(s) within the areas targeted by this assay, and inadequate number of viral copies(<138 copies/mL). A negative result must be combined with clinical observations, patient history, and epidemiological information. The expected result is Negative.  Fact Sheet for Patients:  EntrepreneurPulse.com.au  Fact Sheet for Healthcare Providers:  IncredibleEmployment.be  This test is no t yet approved or cleared by the [place] FDA and  has been authorized for detection and/or diagnosis of SARS-CoV-2 by FDA under an Emergency Use Authorization (EUA). This EUA will remain  in effect (meaning this test can be used) for the duration of the COVID-19 declaration under Section 564(b)(1) of the Act, 21 U.S.C.section 360bbb-3(b)(1), unless the authorization is terminated  or revoked  sooner.       Influenza A by PCR NEGATIVE NEGATIVE Final   Influenza B by PCR NEGATIVE NEGATIVE Final    Comment: (NOTE) The Xpert Xpress SARS-CoV-2/FLU/RSV plus assay is intended as an aid in the diagnosis of influenza from Nasopharyngeal swab specimens and should not be used as a sole basis for treatment. Nasal washings and aspirates are unacceptable for Xpert Xpress SARS-CoV-2/FLU/RSV testing.  Fact Sheet for Patients: EntrepreneurPulse.com.au  Fact Sheet for Healthcare Providers: IncredibleEmployment.be  This test is not yet approved or cleared by the [place] FDA and has been authorized for detection and/or diagnosis of SARS-CoV-2 by FDA under an Emergency Use Authorization (EUA). This EUA will remain in effect (meaning this test can be used) for the duration of the COVID-19 declaration under Section 564(b)(1) of the Act, 21 U.S.C. section 360bbb-3(b)(1), unless the authorization is terminated or revoked.  Performed at [facility], [address] [place], [place] [address]   MRSA Next Gen by PCR, Nasal     Status: None   Collection Time: 04/04/21 10:29 PM   Specimen: Nasal Mucosa; Nasal Swab  Result Value Ref Range Status   MRSA by PCR Next Gen NOT DETECTED NOT DETECTED Final    Comment: (NOTE) The GeneXpert MRSA Assay (FDA approved for NASAL specimens only), is one component of a comprehensive MRSA colonization surveillance program. It is not intended to diagnose MRSA infection nor to guide or monitor treatment for MRSA infections. Test performance is not FDA approved in patients less than 15 years old. Performed at [facility], [address] [place], [place] [address]      Labs: BNP (last 3 results) Recent Labs    02/21/21 0504 04/04/21 1725 04/07/21 0339  BNP 31.0 239.7* 65.6   Basic Metabolic Panel: Recent Labs  Lab 04/05/21 0950 04/06/21 0435 04/07/21 0339 04/08/21 0650  04/09/21 0500 04/11/21 0431  NA 144 140 140 137 139  --   K 4.8 4.1 4.0 4.0 4.3  --   CL 99 97* 96* 96* 96*  --   CO2 40* 35* 36* 37* 35*  --   GLUCOSE 73 110* 86 95 93  --   BUN 19 23 22 22  24*  --   CREATININE 1.02 1.17 1.16 1.18 1.16 1.13  CALCIUM 8.5* 8.3* 8.3* 8.1* 8.5*  --    Liver Function Tests: Recent Labs  Lab 04/04/21 1725  AST 16  ALT 19  ALKPHOS 119  BILITOT 0.8  PROT 9.1*  ALBUMIN 4.1   No results for input(s): LIPASE, AMYLASE in the last 168 hours. No results for input(s): AMMONIA in the last 168 hours. CBC: Recent Labs  Lab 04/04/21 2021 04/08/21 0650 04/11/21 0431  WBC 8.8 7.3  6.1  HGB 13.7 12.3* 13.0  HCT 53.2* 45.3 48.1  MCV 91.9 87.1 87.6  PLT 177 116* 117*   Cardiac Enzymes: No results for input(s): CKTOTAL, CKMB, CKMBINDEX, TROPONINI in the last 168 hours. BNP: Invalid input(s): POCBNP CBG: Recent Labs  Lab 04/04/21 2229 04/04/21 2340  GLUCAP 69* 93   D-Dimer No results for input(s): DDIMER in the last 72 hours. Hgb A1c No results for input(s): HGBA1C in the last 72 hours. Lipid Profile No results for input(s): CHOL, HDL, LDLCALC, TRIG, CHOLHDL, LDLDIRECT in the last 72 hours. Thyroid function studies No results for input(s): TSH, T4TOTAL, T3FREE, THYROIDAB in the last 72 hours.  Invalid input(s): FREET3 Anemia work up No results for input(s): VITAMINB12, FOLATE, FERRITIN, TIBC, IRON, RETICCTPCT in the last 72 hours. Urinalysis    Component Value Date/Time   COLORURINE YELLOW (A) 02/18/2021 2342   APPEARANCEUR CLOUDY (A) 02/18/2021 2342   APPEARANCEUR Clear 04/15/2014 1240   LABSPEC 1.019 02/18/2021 2342   LABSPEC 1.013 04/15/2014 1240   PHURINE 5.0 02/18/2021 2342   GLUCOSEU NEGATIVE 02/18/2021 2342   GLUCOSEU Negative 04/15/2014 1240   HGBUR MODERATE (A) 02/18/2021 2342   BILIRUBINUR NEGATIVE 02/18/2021 2342   BILIRUBINUR Negative 04/15/2014 [place] 02/18/2021 2342   PROTEINUR NEGATIVE 02/18/2021 2342    NITRITE NEGATIVE 02/18/2021 2342   LEUKOCYTESUR LARGE (A) 02/18/2021 2342   LEUKOCYTESUR Negative 04/15/2014 1240   Sepsis Labs Invalid input(s): PROCALCITONIN,  WBC,  LACTICIDVEN Microbiology Recent Results (from the past 240 hour(s))  Resp Panel by RT-PCR (Flu A&B, Covid) Nasopharyngeal Swab     Status: None   Collection Time: 04/04/21  3:39 PM   Specimen: Nasopharyngeal Swab; Nasopharyngeal(NP) swabs in vial transport medium  Result Value Ref Range Status   SARS Coronavirus 2 by RT PCR NEGATIVE NEGATIVE Final    Comment: (NOTE) SARS-CoV-2 target nucleic acids are NOT DETECTED.  The SARS-CoV-2 RNA is generally detectable in upper respiratory specimens during the acute phase of infection. The lowest concentration of SARS-CoV-2 viral copies this assay can detect is 138 copies/mL. A negative result does not preclude SARS-Cov-2 infection and should not be used as the sole basis for treatment or other patient management decisions. A negative result may occur with  improper specimen collection/handling, submission of specimen other than nasopharyngeal swab, presence of viral mutation(s) within the areas targeted by this assay, and inadequate number of viral copies(<138 copies/mL). A negative result must be combined with clinical observations, patient history, and epidemiological information. The expected result is Negative.  Fact Sheet for Patients:  EntrepreneurPulse.com.au  Fact Sheet for Healthcare Providers:  IncredibleEmployment.be  This test is no t yet approved or cleared by the [place] FDA and  has been authorized for detection and/or diagnosis of SARS-CoV-2 by FDA under an Emergency Use Authorization (EUA). This EUA will remain  in effect (meaning this test can be used) for the duration of the COVID-19 declaration under Section 564(b)(1) of the Act, 21 U.S.C.section 360bbb-3(b)(1), unless the authorization is terminated  or  revoked sooner.       Influenza A by PCR NEGATIVE NEGATIVE Final   Influenza B by PCR NEGATIVE NEGATIVE Final    Comment: (NOTE) The Xpert Xpress SARS-CoV-2/FLU/RSV plus assay is intended as an aid in the diagnosis of influenza from Nasopharyngeal swab specimens and should not be used as a sole basis for treatment. Nasal washings and aspirates are unacceptable for Xpert Xpress SARS-CoV-2/FLU/RSV testing.  Fact Sheet for Patients: EntrepreneurPulse.com.au  Fact  Sheet for Healthcare Providers: IncredibleEmployment.be  This test is not yet approved or cleared by the [place] and has been authorized for detection and/or diagnosis of SARS-CoV-2 by FDA under an Emergency Use Authorization (EUA). This EUA will remain in effect (meaning this test can be used) for the duration of the COVID-19 declaration under Section 564(b)(1) of the Act, 21 U.S.C. section 360bbb-3(b)(1), unless the authorization is terminated or revoked.  Performed at [facility], [address] [place], [place] [address]   MRSA Next Gen by PCR, Nasal     Status: None   Collection Time: 04/04/21 10:29 PM   Specimen: Nasal Mucosa; Nasal Swab  Result Value Ref Range Status   MRSA by PCR Next Gen NOT DETECTED NOT DETECTED Final    Comment: (NOTE) The GeneXpert MRSA Assay (FDA approved for NASAL specimens only), is one component of a comprehensive MRSA colonization surveillance program. It is not intended to diagnose MRSA infection nor to guide or monitor treatment for MRSA infections. Test performance is not FDA approved in patients less than 43 years old. Performed at [facility], [address], [place], [place] [address]      Time coordinating discharge: Over 30 minutes  SIGNED:   Sidney Ace, MD  [facility] 04/11/2021, 4:49 PM Pager   If 7PM-7AM, please contact night-coverage
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
## 2021-04-24 ENCOUNTER — Ambulatory Visit: Payer: Medicaid Other | Admitting: Family

## 2021-05-02 ENCOUNTER — Telehealth: Payer: Self-pay | Admitting: Family

## 2021-05-02 NOTE — Telephone Encounter (Signed)
Received phone call from patient saying that he thinks he's gained weight but doesn't know how much. Admits that he hasn't been weighing himself daily and didn't weigh yesterday or today but just feels more swollen. Says that it looks like his feet are swollen along with swelling in his legs up to his knees. Reports some shortness of breath but denies any chest pain.   Says that he's only been taking his torsemide as 40mg  once daily (was prescribed at discharge BID), not taking potassium yet as he has to go pick it up and that IV lasix "doesn't help". He also says that he missed taking the torsemide completely yesterday.   Offered to order IV lasix outpatient but he defers since he didn't feel like it helped. Instructed to pick up his potassium and to increase his torsemide to 40mg  BID as prescribed at discharge. He currently has f/u in our office in 2 days. Should symptoms worsen, he can call us back or present to the ED.   Patient verbalized understanding.

## 2021-05-03 ENCOUNTER — Other Ambulatory Visit: Payer: Self-pay

## 2021-05-03 ENCOUNTER — Inpatient Hospital Stay
Admission: EM | Admit: 2021-05-03 | Discharge: 2021-05-08 | DRG: 291 | Disposition: A | Discharge status: Home Health | Payer: Medicaid Other | Attending: Student | Admitting: Student

## 2021-05-03 ENCOUNTER — Emergency Department: Payer: Medicaid Other

## 2021-05-03 DIAGNOSIS — J449 Chronic obstructive pulmonary disease, unspecified: Secondary | ICD-10-CM

## 2021-05-03 DIAGNOSIS — Z6841 Body Mass Index (BMI) 40.0 and over, adult: Secondary | ICD-10-CM | POA: Diagnosis not present

## 2021-05-03 DIAGNOSIS — Z9114 Patient's other noncompliance with medication regimen: Secondary | ICD-10-CM

## 2021-05-03 DIAGNOSIS — Z7982 Long term (current) use of aspirin: Secondary | ICD-10-CM

## 2021-05-03 DIAGNOSIS — I739 Peripheral vascular disease, unspecified: Secondary | ICD-10-CM | POA: Diagnosis present

## 2021-05-03 DIAGNOSIS — Z8616 Personal history of COVID-19: Secondary | ICD-10-CM | POA: Diagnosis not present

## 2021-05-03 DIAGNOSIS — J9621 Acute and chronic respiratory failure with hypoxia: Secondary | ICD-10-CM | POA: Diagnosis present

## 2021-05-03 DIAGNOSIS — J441 Chronic obstructive pulmonary disease with (acute) exacerbation: Secondary | ICD-10-CM

## 2021-05-03 DIAGNOSIS — G4733 Obstructive sleep apnea (adult) (pediatric): Secondary | ICD-10-CM | POA: Diagnosis present

## 2021-05-03 DIAGNOSIS — I110 Hypertensive heart disease with heart failure: Principal | ICD-10-CM | POA: Diagnosis present

## 2021-05-03 DIAGNOSIS — G4730 Sleep apnea, unspecified: Secondary | ICD-10-CM | POA: Diagnosis present

## 2021-05-03 DIAGNOSIS — N179 Acute kidney failure, unspecified: Secondary | ICD-10-CM | POA: Diagnosis not present

## 2021-05-03 DIAGNOSIS — E559 Vitamin D deficiency, unspecified: Secondary | ICD-10-CM | POA: Diagnosis present

## 2021-05-03 DIAGNOSIS — Z87891 Personal history of nicotine dependence: Secondary | ICD-10-CM

## 2021-05-03 DIAGNOSIS — G894 Chronic pain syndrome: Secondary | ICD-10-CM | POA: Diagnosis present

## 2021-05-03 DIAGNOSIS — Z7951 Long term (current) use of inhaled steroids: Secondary | ICD-10-CM | POA: Diagnosis not present

## 2021-05-03 DIAGNOSIS — I5033 Acute on chronic diastolic (congestive) heart failure: Secondary | ICD-10-CM | POA: Diagnosis present

## 2021-05-03 DIAGNOSIS — Z9049 Acquired absence of other specified parts of digestive tract: Secondary | ICD-10-CM

## 2021-05-03 DIAGNOSIS — I959 Hypotension, unspecified: Secondary | ICD-10-CM | POA: Diagnosis present

## 2021-05-03 DIAGNOSIS — Z9981 Dependence on supplemental oxygen: Secondary | ICD-10-CM | POA: Diagnosis not present

## 2021-05-03 DIAGNOSIS — Z79899 Other long term (current) drug therapy: Secondary | ICD-10-CM

## 2021-05-03 DIAGNOSIS — E875 Hyperkalemia: Secondary | ICD-10-CM | POA: Diagnosis not present

## 2021-05-03 DIAGNOSIS — I10 Essential (primary) hypertension: Secondary | ICD-10-CM | POA: Diagnosis present

## 2021-05-03 DIAGNOSIS — I509 Heart failure, unspecified: Secondary | ICD-10-CM

## 2021-05-03 DIAGNOSIS — R0602 Shortness of breath: Secondary | ICD-10-CM

## 2021-05-03 DIAGNOSIS — R079 Chest pain, unspecified: Secondary | ICD-10-CM | POA: Diagnosis not present

## 2021-05-03 LAB — COMPREHENSIVE METABOLIC PANEL
ALT: 9 U/L (ref 0–44)
AST: 13 U/L — ABNORMAL LOW (ref 15–41)
Albumin: 3.2 g/dL — ABNORMAL LOW (ref 3.5–5.0)
Alkaline Phosphatase: 95 U/L (ref 38–126)
Anion gap: 5 (ref 5–15)
BUN: 17 mg/dL (ref 8–23)
CO2: 41 mmol/L — ABNORMAL HIGH (ref 22–32)
Calcium: 8.6 mg/dL — ABNORMAL LOW (ref 8.9–10.3)
Chloride: 95 mmol/L — ABNORMAL LOW (ref 98–111)
Creatinine, Ser: 1.22 mg/dL (ref 0.61–1.24)
GFR, Estimated: >60 mL/min (ref >60)
Glucose, Bld: 112 mg/dL — ABNORMAL HIGH (ref 70–99)
Potassium: 3.5 mmol/L (ref 3.5–5.1)
Sodium: 141 mmol/L (ref 135–145)
Total Bilirubin: 0.3 mg/dL (ref 0.3–1.2)
Total Protein: 7.9 g/dL (ref 6.5–8.1)

## 2021-05-03 LAB — CBC
HCT: 50.1 % (ref 39.0–52.0)
Hemoglobin: 13.4 g/dL (ref 13.0–17.0)
MCH: 23.3 pg — ABNORMAL LOW (ref 26.0–34.0)
MCHC: 26.7 g/dL — ABNORMAL LOW (ref 30.0–36.0)
MCV: 87.3 fL (ref 80.0–100.0)
Platelets: 147 10*3/uL — ABNORMAL LOW (ref 150–400)
RBC: 5.74 MIL/uL (ref 4.22–5.81)
RDW: 17.8 % — ABNORMAL HIGH (ref 11.5–15.5)
WBC: 8.2 10*3/uL (ref 4.0–10.5)
nRBC: 0 % (ref 0.0–0.2)

## 2021-05-03 LAB — RESP PANEL BY RT-PCR (FLU A&B, COVID) ARPGX2
Influenza A by PCR: NEGATIVE
Influenza B by PCR: NEGATIVE
SARS Coronavirus 2 by RT PCR: NEGATIVE

## 2021-05-03 LAB — TROPONIN I (HIGH SENSITIVITY)
Troponin I (High Sensitivity): 8 ng/L (ref <18)
Troponin I (High Sensitivity): 7 ng/L (ref <18)

## 2021-05-03 LAB — BRAIN NATRIURETIC PEPTIDE: B Natriuretic Peptide: 47 pg/mL (ref 0.0–100.0)

## 2021-05-03 MED ORDER — FUROSEMIDE 10 MG/ML IJ SOLN
60.0000 mg | Freq: Once | INTRAMUSCULAR | Status: AC
  Administered 2021-05-03: 60 mg via INTRAVENOUS
  Filled 2021-05-03: qty 8

## 2021-05-03 MED ORDER — IPRATROPIUM-ALBUTEROL 0.5-2.5 (3) MG/3ML IN SOLN
3.0000 mL | Freq: Once | RESPIRATORY_TRACT | Status: AC
  Administered 2021-05-03: 3 mL via RESPIRATORY_TRACT
  Filled 2021-05-03: qty 3

## 2021-05-03 MED ORDER — SIMVASTATIN 20 MG PO TABS
10.0000 mg | ORAL_TABLET | Freq: Every day | ORAL | Status: DC
  Administered 2021-05-04 – 2021-05-07 (×4): 10 mg via ORAL
  Filled 2021-05-03 (×4): qty 1

## 2021-05-03 MED ORDER — ONDANSETRON HCL 4 MG/2ML IJ SOLN: 4.0000 mg | Freq: Four times a day (QID) | INTRAMUSCULAR | Status: DC | PRN

## 2021-05-03 MED ORDER — CARVEDILOL 6.25 MG PO TABS
6.2500 mg | ORAL_TABLET | Freq: Every day | ORAL | Status: DC
  Administered 2021-05-04 – 2021-05-08 (×5): 6.25 mg via ORAL
  Filled 2021-05-03 (×5): qty 1

## 2021-05-03 MED ORDER — METHYLPREDNISOLONE SODIUM SUCC 125 MG IJ SOLR
125.0000 mg | Freq: Once | INTRAMUSCULAR | Status: AC
Start: 2021-05-03 — End: 2021-05-03
  Administered 2021-05-03: 125 mg via INTRAVENOUS
  Filled 2021-05-03: qty 2

## 2021-05-03 MED ORDER — ASPIRIN EC 81 MG PO TBEC
81.0000 mg | DELAYED_RELEASE_TABLET | Freq: Every day | ORAL | Status: DC
  Administered 2021-05-04 – 2021-05-08 (×5): 81 mg via ORAL
  Filled 2021-05-03 (×5): qty 1

## 2021-05-03 MED ORDER — ACETAMINOPHEN 325 MG PO TABS
650.0000 mg | ORAL_TABLET | Freq: Four times a day (QID) | ORAL | Status: DC | PRN
  Administered 2021-05-04 – 2021-05-07 (×5): 650 mg via ORAL
  Filled 2021-05-03 (×5): qty 2

## 2021-05-03 MED ORDER — LOSARTAN POTASSIUM 50 MG PO TABS
25.0000 mg | ORAL_TABLET | Freq: Every day | ORAL | Status: DC
  Administered 2021-05-04: 25 mg via ORAL
  Filled 2021-05-03: qty 1

## 2021-05-03 MED ORDER — PANTOPRAZOLE SODIUM 40 MG PO TBEC
40.0000 mg | DELAYED_RELEASE_TABLET | Freq: Every day | ORAL | Status: DC
  Administered 2021-05-04 – 2021-05-08 (×5): 40 mg via ORAL
  Filled 2021-05-03 (×6): qty 1

## 2021-05-03 MED ORDER — POTASSIUM CHLORIDE CRYS ER 20 MEQ PO TBCR
10.0000 meq | EXTENDED_RELEASE_TABLET | Freq: Every day | ORAL | Status: DC
  Administered 2021-05-04: 10 meq via ORAL
  Filled 2021-05-03: qty 1

## 2021-05-03 MED ORDER — ONDANSETRON HCL 4 MG PO TABS: 4.0000 mg | ORAL_TABLET | Freq: Four times a day (QID) | ORAL | Status: DC | PRN

## 2021-05-03 MED ORDER — ENOXAPARIN SODIUM 100 MG/ML IJ SOSY
0.5000 mg/kg | PREFILLED_SYRINGE | INTRAMUSCULAR | Status: DC
  Administered 2021-05-04 – 2021-05-07 (×5): 95 mg via SUBCUTANEOUS
  Filled 2021-05-03 (×6): qty 0.95

## 2021-05-03 MED ORDER — ACETAMINOPHEN 650 MG RE SUPP: 650.0000 mg | Freq: Four times a day (QID) | RECTAL | Status: DC | PRN

## 2021-05-03 MED ORDER — FUROSEMIDE 10 MG/ML IJ SOLN
40.0000 mg | Freq: Two times a day (BID) | INTRAMUSCULAR | Status: DC
  Administered 2021-05-04: 40 mg via INTRAVENOUS
  Filled 2021-05-03: qty 4

## 2021-05-03 NOTE — ED Notes (Signed)
This rn attempted to put up side rail for pt safety, Pt refusing to let this RN put up side rail.

## 2021-05-03 NOTE — ED Provider Notes (Signed)
[facility] Provider Note    Event Date/Time   First MD Initiated Contact with Patient 05/03/21 1918     (approximate)  History   Chief Complaint: Chest Pain  HPI  Gary [name] is a 66 y.o. male with a past medical history of CHF, COPD, on 2 L nasal cannula oxygen who presents to the emergency department for shortness of breath.  According to the patient for the past 2 to 3 days he has been experiencing progressively worsening shortness of breath states chest pain intermittently over the past 2 days as well.  Patient states he wears 2 L of oxygen at home but his oxygen machine broke yesterday and is no longer working properly.  Patient requiring 4 L to maintain sats over 90% currently.  Physical Exam   Triage Vital Signs: ED Triage Vitals  Enc Vitals Group     BP 05/03/21 1855 (!) 124/91     Pulse Rate 05/03/21 1855 91     Resp 05/03/21 1855 (!) 24     Temp 05/03/21 1855 98.2 F (36.8 C)     Temp Source 05/03/21 1855 Oral     SpO2 05/03/21 1855 92 %     Weight 05/03/21 1853 (!) 415 lb (188.2 kg)     Height 05/03/21 1853 6\' 3"  (1.905 m)     Head Circumference --      Peak Flow --      Pain Score 05/03/21 1853 5     Pain Loc --      Pain Edu? --      Excl. in [place]? --     Most recent vital signs: Vitals:   05/03/21 1855  BP: (!) 124/91  Pulse: 91  Resp: (!) 24  Temp: 98.2 F (36.8 C)  SpO2: 92%    General: Awake, no distress.  CV:  Good peripheral perfusion.  Regular rate and rhythm  Resp:  Normal effort.  Equal breath sounds with mild expiratory wheeze.  No rales or rhonchi. Abd:  No distention.  Soft, nontender.  No rebound or guarding. Other:  1+ lower extremity edema equal bilaterally.  Calves are nontender.   ED Results / Procedures / Treatments   EKG  EKG viewed and interpreted by myself shows a normal sinus rhythm at 91 bpm with a narrow QRS, normal axis, normal intervals besides slight PR prolongation, nonspecific but no  concerning ST changes.  RADIOLOGY  I have seen and evaluated the chest x-ray images.  Appears consistent with poor inspiration.  Some mild haziness bilaterally possibly representing interstitial edema. Radiology is read as cardiomegaly and venous congestion.   MEDICATIONS ORDERED IN ED: Medications - No data to display   IMPRESSION / MDM / [place] / ED COURSE  I reviewed the triage vital signs and the nursing notes.  Patient presents emergency department for shortness of breath and chest pain.  Patient states worsening shortness of breath over the past 2 days as well as intermittent chest pain in the center of his chest.  Denies any nausea or diaphoresis.  No fever cough or congestion.  We will check labs including troponin and a BNP we will obtain a chest x-ray.  No concerning findings on EKG.  Concern for possible CHF given lower extremity edema shortness of breath and increased O2 requirement over baseline.  Differential would also include COPD, ACS, pneumonia, pneumothorax.  Patient's chest x-ray consistent with cardiomegaly with venous congestion possibly indicating CHF exacerbation.  Patient  also has wheeze and a history of COPD given his increased O2 requirement we will dose Solu-Medrol and duo nebs.  Given the patient's chest x-ray findings and lower extreme edema we will dose 60 mg of IV Lasix.  I suspect likely combination of CHF and COPD.  Patient now requiring 4 L to maintain sats over 91% compared to baseline 2 L O2's.  He also believes his home O2 concentrator is broken.  Patient will be admitted to the hospital service for further treatment.  FINAL CLINICAL IMPRESSION(S) / ED DIAGNOSES   Dyspnea Hypoxia CHF COPD  Note:  This document was prepared using Dragon voice recognition software and may include unintentional dictation errors.   Harvest Dark, MD 05/03/21 2128
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
## 2021-05-03 NOTE — ED Triage Notes (Signed)
[facility] reports pt coming from home c/o sob and chest pain. Upon FD arrival pt in the low 80s. Hx COPD/CHF.

## 2021-05-03 NOTE — Progress Notes (Signed)
Anticoagulation monitoring(Lovenox):  66 yo  male ordered Lovenox 49 mg Q24h    Filed Weights   05/03/21 1853  Weight: (!) 188.2 kg (415 lb)   BMI 51.87   Lab Results  Component Value Date   CREATININE 1.22 05/03/2021   CREATININE 1.13 04/11/2021   CREATININE 1.16 04/09/2021   Estimated Creatinine Clearance: 110.5 mL/min (by C-G formula based on SCr of 1.22 mg/dL). Hemoglobin & Hematocrit     Component Value Date/Time   HGB 13.4 05/03/2021 1911   HGB 13.5 04/15/2014 1240   HCT 50.1 05/03/2021 1911   HCT 44.6 04/15/2014 1240     Per Protocol for Patient with estCrcl > 30 ml/min and BMI > 30, will transition to Lovenox 95 mg Q24h.

## 2021-05-04 ENCOUNTER — Encounter: Payer: Self-pay | Admitting: Internal Medicine

## 2021-05-04 ENCOUNTER — Ambulatory Visit: Payer: Medicaid Other | Admitting: Family

## 2021-05-04 DIAGNOSIS — I5033 Acute on chronic diastolic (congestive) heart failure: Secondary | ICD-10-CM | POA: Diagnosis not present

## 2021-05-04 LAB — CBC
HCT: 53.3 % — ABNORMAL HIGH (ref 39.0–52.0)
Hemoglobin: 14.4 g/dL (ref 13.0–17.0)
MCH: 23.2 pg — ABNORMAL LOW (ref 26.0–34.0)
MCHC: 27 g/dL — ABNORMAL LOW (ref 30.0–36.0)
MCV: 85.8 fL (ref 80.0–100.0)
Platelets: 132 10*3/uL — ABNORMAL LOW (ref 150–400)
RBC: 6.21 MIL/uL — ABNORMAL HIGH (ref 4.22–5.81)
RDW: 18.8 % — ABNORMAL HIGH (ref 11.5–15.5)
WBC: 7.3 10*3/uL (ref 4.0–10.5)
nRBC: 0 % (ref 0.0–0.2)

## 2021-05-04 LAB — BASIC METABOLIC PANEL
Anion gap: 10 (ref 5–15)
BUN: 19 mg/dL (ref 8–23)
CO2: 33 mmol/L — ABNORMAL HIGH (ref 22–32)
Calcium: 8.5 mg/dL — ABNORMAL LOW (ref 8.9–10.3)
Chloride: 96 mmol/L — ABNORMAL LOW (ref 98–111)
Creatinine, Ser: 1.28 mg/dL — ABNORMAL HIGH (ref 0.61–1.24)
GFR, Estimated: >60 mL/min (ref >60)
Glucose, Bld: 140 mg/dL — ABNORMAL HIGH (ref 70–99)
Potassium: 5.1 mmol/L (ref 3.5–5.1)
Sodium: 139 mmol/L (ref 135–145)

## 2021-05-04 LAB — RESPIRATORY PANEL BY PCR

## 2021-05-04 LAB — PHOSPHORUS: Phosphorus: 4.8 mg/dL — ABNORMAL HIGH (ref 2.5–4.6)

## 2021-05-04 LAB — HIV ANTIBODY (ROUTINE TESTING W REFLEX): HIV Screen 4th Generation wRfx: NONREACTIVE

## 2021-05-04 LAB — MAGNESIUM: Magnesium: 2 mg/dL (ref 1.7–2.4)

## 2021-05-04 MED ORDER — FUROSEMIDE 10 MG/ML IJ SOLN: 40.0000 mg | Freq: Every day | INTRAMUSCULAR | Status: DC

## 2021-05-04 MED ORDER — FUROSEMIDE 10 MG/ML IJ SOLN
40.0000 mg | Freq: Every day | INTRAMUSCULAR | Status: DC
  Administered 2021-05-04: 40 mg via INTRAVENOUS
  Filled 2021-05-04: qty 4

## 2021-05-04 MED ORDER — ALBUTEROL SULFATE (2.5 MG/3ML) 0.083% IN NEBU: 2.5000 mg | INHALATION_SOLUTION | RESPIRATORY_TRACT | Status: DC | PRN

## 2021-05-04 MED ORDER — NITROGLYCERIN 0.4 MG SL SUBL: 0.4000 mg | SUBLINGUAL_TABLET | SUBLINGUAL | Status: DC | PRN

## 2021-05-04 MED ORDER — PREDNISONE 20 MG PO TABS
40.0000 mg | ORAL_TABLET | Freq: Every day | ORAL | Status: AC
  Administered 2021-05-05 – 2021-05-08 (×4): 40 mg via ORAL
  Filled 2021-05-04 (×4): qty 2

## 2021-05-04 MED ORDER — PREDNISONE 20 MG PO TABS: 40.0000 mg | ORAL_TABLET | Freq: Every day | ORAL | Status: DC

## 2021-05-04 MED ORDER — METHYLPREDNISOLONE SODIUM SUCC 40 MG IJ SOLR: 40.0000 mg | Freq: Two times a day (BID) | INTRAMUSCULAR | Status: DC

## 2021-05-04 MED ORDER — IPRATROPIUM-ALBUTEROL 0.5-2.5 (3) MG/3ML IN SOLN
3.0000 mL | Freq: Four times a day (QID) | RESPIRATORY_TRACT | Status: DC
  Administered 2021-05-04 – 2021-05-08 (×17): 3 mL via RESPIRATORY_TRACT
  Filled 2021-05-04 (×17): qty 3

## 2021-05-04 MED ORDER — METHYLPREDNISOLONE SODIUM SUCC 40 MG IJ SOLR
40.0000 mg | Freq: Two times a day (BID) | INTRAMUSCULAR | Status: AC
  Administered 2021-05-04: 40 mg via INTRAVENOUS
  Filled 2021-05-04: qty 1

## 2021-05-04 NOTE — TOC Initial Note (Signed)
Transition of Care [facility]) - Initial/Assessment Note    Patient Details  Name: Gary [name] MRN: [id number] Date of Birth: June 02, 1955  Transition of Care [facility]) CM/SW Contact:    Shelbie Hutching, RN Phone Number: 05/04/2021, 1:33 PM  Clinical Narrative:                 Patient admitted to the hospital for acute on chronic diastolic CHF.  Patient is on chronic oxygen at home 2L [place] from [employer].  Patient reports he has a CPAP.  He is independent at home and sometimes uses a cane- he also has a walker and a wheelchair if needed.  He drives himself to appointments.  He is set up in the HF clinic with [facility].  Patient agrees to having a [place] RN set up if home health agency can be found.  Referral given to [facility] with [facility] is out of network and Alvis Lemmings has not responded.   Patient will work on trying to get a ride home at discharge.    Expected Discharge Plan: [place] Barriers to Discharge: Continued Medical Work up   Patient Goals and CMS Choice Patient states their goals for this hospitalization and ongoing recovery are:: Patient wants to get back home      Expected Discharge Plan and Services Expected Discharge Plan: [place]   Discharge Planning Services: CM Consult   Living arrangements for the past 2 months: Apartment                 DME Arranged: N/A         HH Arranged: RN HH Agency: [place] ([place]) Date HH Agency Contacted: 05/04/21 Time [place]: 1333 Representative spoke with at [place]: Corene Cornea  Prior Living Arrangements/Services Living arrangements for the past 2 months: [place] with:: Self Patient language and need for interpreter reviewed:: Yes Do you feel safe going back to the place where you live?: Yes      Need for Family Participation in Patient Care: Yes (Comment) (CHF) Care giver support system in place?: Yes (comment) (significant other) Current home services:  DME (oxygen, walker, cane, cpap) Criminal Activity/Legal Involvement Pertinent to Current Situation/Hospitalization: No - Comment as needed  Activities of Daily Living      Permission Sought/Granted Permission sought to share information with : Case Manager Permission granted to share information with : Yes, Verbal Permission Granted     Permission granted to share info w AGENCY: home health agency        Emotional Assessment Appearance:: Appears stated age Attitude/Demeanor/Rapport: Engaged Affect (typically observed): Accepting Orientation: : Oriented to Self, Oriented to Place, Oriented to  Time, Oriented to Situation Alcohol / Substance Use: Not Applicable Psych Involvement: No (comment)  Admission diagnosis:  CHF exacerbation ([place]) [I50.9] Patient Active Problem List   Diagnosis Date Noted   CHF exacerbation ([place]) 05/03/2021   Acute CHF (congestive heart failure) ([place]) 04/04/2021   Weakness    Pneumonia 03/11/2021   Acute respiratory failure with hypoxia and hypercapnia ([place]) 03/11/2021   (HFpEF) heart failure with preserved ejection fraction ([name])    Chronic respiratory failure with hypoxia ([name])    Hyperlipidemia 02/19/2021   Acute respiratory distress syndrome (ARDS) due to COVID-19 virus ([place])    Acute on chronic respiratory failure ([place]) 02/18/2021   Spondylosis without myelopathy or radiculopathy, lumbosacral region 11/15/2020   Lumbosacral facet joint syndrome (Bilateral) 10/31/2020   Generalized osteoarthritis  of multiple sites 10/31/2020   AKI (acute kidney injury) ([place]) 03/04/2020   Hyperkalemia 03/04/2020   COPD with acute exacerbation ([place]) 03/02/2020   Marijuana use 01/17/2020   Elevated BUN 01/17/2020   Hypocalcemia 01/17/2020   Hypochloremia 01/17/2020   Low thyroid stimulating hormone (TSH) level 01/17/2020   Vitamin D deficiency 01/17/2020   Elevated sed rate 01/17/2020   Thrombocytopenia ([name]) 01/17/2020   Elevated hematocrit 01/17/2020    Elevated brain natriuretic peptide (BNP) level 01/17/2020   Positive hepatitis C antibody test 01/17/2020   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 mass index (BMI) of 50.0 to 59.9 in adult [facility]) 01/17/2020   Osteoarthritis of glenohumeral joints (Bilateral) 01/17/2020   Osteoarthritis of AC (acromioclavicular) joints (Bilateral) 01/17/2020   Lumbar Grade 1 Anterolisthesis of L4 over L5 (3 mm) (stable) 01/17/2020   Osteoarthritis of lumbar spine (L3 through S1) 01/17/2020   Lumbar facet osteoarthritis (Multilevel) (Bilateral) 01/17/2020   Osteoarthritis of hips (Bilateral) 01/17/2020   Osteoarthritis of knees (Bilateral) 01/17/2020   Tricompartment osteoarthritis of knees (Bilateral) 01/17/2020   Patellofemoral arthralgia of knees (Bilateral) 01/17/2020   Plantar and Achilles calcaneal spurs (Bilateral) 01/17/2020   Ankle edema (Bilateral) 01/17/2020   Traumatic osteoarthritis of hands (Bilateral) 01/17/2020   Polysubstance abuse ([place]) 08/25/2019   Chronic pain syndrome 07/14/2019   Pharmacologic therapy 07/14/2019   Disorder of skeletal system 07/14/2019   Problems influencing health status 07/14/2019   Chronic low back pain (1ry area of Pain) (Bilateral) (L>R) w/o sciatica 07/14/2019   Chronic shoulder pain (2ry area of Pain) (Bilateral) (L>R) 07/14/2019   Chronic feet pain (3ry area of Pain) (Bilateral) 07/14/2019   Chronic knee pain (4th area of Pain) (Bilateral) (L>R) 07/14/2019   Chronic hip pain (5th area of Pain) (Bilateral) (L>R) 07/14/2019   Chronic hand pain (6th area of Pain) (Bilateral) (L>R) 07/14/2019   Lumbar facet arthropathy 07/14/2019   DDD (degenerative disc disease), lumbosacral 07/14/2019   Closed fracture of shaft of humerus, sequela (Left) 07/14/2019   DDD (degenerative disc disease), cervical 07/14/2019   Tobacco use 05/04/2017   Lymphedema 05/04/2017   CHF (congestive heart failure) ([place]) 04/12/2017   Acute on chronic diastolic (congestive)  heart failure ([place]) 02/28/2015   Obstructive sleep apnea 02/28/2015   Acute on chronic diastolic congestive heart failure ([name])    Acute on chronic respiratory failure with hypoxia ([name])    Edema of both feet    Closed fracture of scapula, sequela (Left) 04/01/2014   Morbid obesity with BMI of 50.0-59.9, adult ([place]) 04/01/2014   HTN (hypertension) 04/01/2014   Multiple rib fractures 03/27/2014   PCP:  [facility] [place]:   [place], [place] - [address] [place] [address] Phone: [phone] Fax: [phone]     Social Determinants of Health (SDOH) Interventions    Readmission Risk Interventions Readmission Risk Prevention Plan 05/04/2021 04/07/2021 03/12/2021  Transportation Screening Complete Complete Complete  PCP or Specialist Appt within 3-5 Days - Complete Complete  Not Complete comments - - -  HRI or [place] - - Complete  Social Work Consult for Recovery Care Planning/Counseling - - Complete  Palliative Care Screening - Not Applicable Not Applicable  Medication Review Press photographer) Complete Complete Complete  PCP or Specialist appointment within 3-5 days of discharge Complete - -  [place] or Home Care Consult Complete - -  SW Recovery Care/Counseling Consult Complete - -  Palliative Care Screening Not Applicable - -  Skilled Nursing Facility Not Applicable - -  Some recent data might be hidden

## 2021-05-04 NOTE — Progress Notes (Signed)
OT Cancellation Note  Patient Details Name: Gary [name] MRN: [id number] DOB: 1955-11-04   Cancelled Treatment:    Reason Eval/Treat Not Completed: OT screened, no needs identified, will sign off. Per chart review and consultation with PT, pt has indicated he is not interested in any rehab services and does not need any additional assistance at home. Will sign off for OT.  [name], PhD, MS, OTR/L 05/04/21, 12:51 PM

## 2021-05-04 NOTE — ED Notes (Signed)
Report given to [facility]. Patient moved to [place]

## 2021-05-04 NOTE — ED Notes (Signed)
Dinner tray brought to pt by dietary services. ?

## 2021-05-04 NOTE — Consult Note (Signed)
° °  Heart Failure Nurse Navigator Note  HFpEF 60 to 65%.  Grade 3 diastolic dysfunction.  Normal right ventricular systolic function.  Presented to the emergency room with complaints of worsening shortness of breath, and feeling swollen.  He called the heart failure complaining of any shortness of breath and felt that his weight was up although he had not been weighing himself and had not been taking his torsemide as recommended twice a day and even skipped a whole day.  Comorbidities:  Morbid obesity  Hypertension Obstructive sleep apnea COPD using CPAP  Labs:  BNP 47, sodium 139, potassium 3.1, chloride 96, CO2 33, BUN 19, creatinine 1.28. Weight is 188.2 kg Blood pressure 126/84  Medication:  Aspirin 81 mg daily Carvedilol 6.25 mg daily Losartan 25 mg daily Simvastatin 10 mg at 1800   Initial meeting with patient on this admission.  He had been discharged from the hospital twice in December 1 time on the sixth and the second time on the 27th.  He had reported to the heart failure clinic that he was not taking his torsemide twice a day as instructed and even missed a whole day.  He was unaware of what his weights were but felt that he was more swollen.  Upon further questioning patient states several months ago when he moved his scale became misplaced so he has not been weighing himself.  Also in discussing fluid restriction added to some days taking in 3 L instead of the 2 L as instructed.  By teach back method went over daily weights and what to report, he needs some reinforcement, also discussed taking a measuring cup and seeing how much liquid his containers hold rather than guesstimating.  Stressed the importance of being compliant with medications, fluid restriction and daily weights if he wants to stay out of the hospital.  States that being unable to breathe is a very scary duration and he does not wish to experience that again.  We will continue to follow  along.  Appointment in the outpatient heart failure clinic on January 31 at 1 PM.  He has a 24% no-show ratio of 24 out of 102 appointments.   Pricilla Riffle RN CHFN

## 2021-05-04 NOTE — ED Notes (Signed)
PT at [facility] to work with pt.

## 2021-05-04 NOTE — ED Notes (Signed)
Resting in bed with eyes closed, appears sleeping. Resp even, unlabored on Bipap 14/6 60% FiO2. NSR on monitor at 80's. No distress noted at this time.

## 2021-05-04 NOTE — Progress Notes (Signed)
[facility] Progress Note  Patient: Gary [name]    HFW:[id number]  DOA: 05/03/2021     Date of Service: the patient was seen and examined on 05/04/2021  Chief Complaint  Patient presents with   Chest Pain   Brief hospital course: Gary [name] is a 66 y.o. male with medical history significant for Diastolic CHF (EF 60 to 02%, G3 DD, 02/2021), COPD on home O2 2 L, HTN, OSA, PVD, as well as prolonged hospitalization from 11/8-12/6/22 for ARDS related to [place] requiring mechanical ventilation and another from 12/20 to 04/11/2021 for CHF, who comes in for evaluation of shortness of breath.  Per chart review patient called his PCPs office a couple days prior with a complaint of fluid retention, and feeling swollen and reported that at the time he was not using his torsemide twice daily as recommended.  He also declined a recommendation to go into the office for IV Lasix as he said IV Lasix does not work for him.  The office advised that he go to the emergency room if symptoms worsen.  Patient states he started having shortness of breath and tried using his nebulizer but it was not working.  On the day of arrival he called EMS due to worsening symptoms.  On arrival of EMS his O2 sat was 84 to 85% on his home O2 t He denies chest pain, cough or fever.  Denies abdominal pain, diarrhea, nausea or vomiting.   ED course: On arrival in the ED, tachypneic to 28 with O2 sat 90 to 91% on 4 L. Blood work: Troponin 8, BNP 47, bicarb 41.  Otherwise mostly unremarkable   EKG, personally reviewed and interpreted: NSR at 91 with nonspecific ST-T wave changes   Imaging: Cardiomegaly Central venous congestion No significant change from comparison exam on 12/23   Patient was treated in the ED with IV Lasix as well as duo nebs and Solu-Medrol.  Hospitalist consulted for admission.    Assessment and Plan:  Acute on chronic respiratory failure with hypoxia  - Multifactorial related to diastolic CHF  exacerbation and COPD exacerbation - Supplemental oxygen to keep sats above 92 - Treat each etiology as outlined below - We will need to check home oxygen supplies at discharge  Chest pain, troponin negative  No significant EKG changes Continue aspirin 80 mg daily, nitroglycerin sublingual as needed Patient had no any prior cardiac work-up Keep n.p.o. after midnight NM Lexiscan tomorrow a.m.   acute on chronic diastolic congestive heart failure Patient is on torsemide at home, patient missed 1 dose on Monday, stated that he was drinking water about 3 L and more for that day. Held torsemide for now S/p Lasix 60 mg given in the ED, started Lasix 40 mg IV daily - Continue carvedilol,  Discontinued losartan due to potassium 5.1, risk of hyperkalemia and renal failure - Daily weights with intake and output monitoring - Echo 02/2021 with EF 60 to 65% and G3 DD Follow repeat 2D echocardiogram   COPD with acute exacerbation ([name]) - DuoNebs scheduled and as needed - Methylprednisolone to prednisone  HTN (hypertension) - Continue carvedilol,  Discontinued losartan due to high potassium level and AKI  Morbid obesity with BMI of 77.4-12.8, adult Complicating factor to overall prognosis and care.  Calorie restricted diet and daily exercise advised to lose body weight  Obstructive sleep apnea Continue nightly CPAP     Body mass index is 51.87 kg/m.  Interventions:       Diet:  Heart healthy, fluid restriction 1.5 L/day DVT Prophylaxis: Subcutaneous Lovenox   Advance goals of care discussion: Full code  Family Communication: family was NOT present at bedside, at the time of interview.  The pt provided permission to discuss medical plan with the family. Opportunity was given to ask question and all questions were answered satisfactorily.   Disposition:  Pt is from Home, admitted with Resp failure, diastolic CHF exacerbation, still has difficulty breathing on IV Lasix, which  precludes a safe discharge. Discharge to home, when improved clinically, may require 1 to 2 days more.  Subjective: No significant events overnight, patient was admitted due to respiratory failure secondary to volume overload diastolic CHF exacerbation Patient feels little bit improvement but still has significant shortness of breath, edema of the lower extremities improving, patient was complaining of midsternal chest pain off and on, no pain at this time.  Patient never had cardiac work-up done such as stress test or cardiac cath, agreed for the stress test tomorrow a.m.   Physical Exam: General:  alert oriented to time, place, and person.  Appear in mild distress, affect appropriate Eyes: PERRLA ENT: Oral Mucosa Clear, moist  Neck: no JVD,  Cardiovascular: S1 and S2 Present, no Murmur,  Respiratory: increased respiratory effort, Bilateral Air entry equal and Decreased, mild Crackles, mild wheezes Abdomen: Bowel Sound present, Soft and no tenderness,  Skin: no rashes Extremities: mild Pedal edema, no calf tenderness Neurologic: without any new focal findings Gait not checked due to patient safety concerns  Vitals:   05/04/21 1330 05/04/21 1400 05/04/21 1430 05/04/21 1500  BP: 119/79 125/83 124/87 118/83  Pulse: 81 82 85 86  Resp: (!) 35 20 (!) 31 (!) 25  Temp:    98.4 F (36.9 C)  TempSrc:    Oral  SpO2: 94% 99% 92% 94%  Weight:      Height:        Intake/Output Summary (Last 24 hours) at 05/04/2021 1628 Last data filed at 05/04/2021 0800 Gross per 24 hour  Intake --  Output 400 ml  Net -400 ml   Filed Weights   05/03/21 1853  Weight: (!) 188.2 kg    Data Reviewed: I have personally reviewed and interpreted daily labs, tele strips, imagings as discussed above. I reviewed all nursing notes, pharmacy notes, vitals, pertinent old records I have discussed plan of care as described above with RN and patient/family.  CBC: Recent Labs  Lab 05/03/21 1911 05/04/21 0630   WBC 8.2 7.3  HGB 13.4 14.4  HCT 50.1 53.3*  MCV 87.3 85.8  PLT 147* 456*   Basic Metabolic Panel: Recent Labs  Lab 05/03/21 1911 05/04/21 0630  NA 141 139  K 3.5 5.1  CL 95* 96*  CO2 41* 33*  GLUCOSE 112* 140*  BUN 17 19  CREATININE 1.22 1.28*  CALCIUM 8.6* 8.5*  MG  --  2.0  PHOS  --  4.8*    Studies: DG Chest Portable 1 View  Result Date: 05/03/2021 CLINICAL DATA:  Short of breath EXAM: PORTABLE CHEST 1 VIEW COMPARISON:  04/07/2021 FINDINGS: Lordotic view. Stable enlarged cardiac silhouette. Low lung volumes. Central venous congestion. No focal consolidation. No pneumothorax. IMPRESSION: Cardiomegaly, low lung volumes, and central venous congestion. No significant change from comparison exam. Electronically Signed   By: Suzy Bouchard M.D.   On: 05/03/2021 19:40    Scheduled Meds:  aspirin EC  81 mg Oral Daily   carvedilol  6.25 mg Oral Daily   enoxaparin (LOVENOX)  injection  0.5 mg/kg Subcutaneous Q24H   furosemide  40 mg Intravenous Daily   ipratropium-albuterol  3 mL Nebulization Q6H   pantoprazole  40 mg Oral Daily   [START ON 05/05/2021] predniSONE  40 mg Oral Q breakfast   simvastatin  10 mg Oral q1800   Continuous Infusions: PRN Meds: acetaminophen **OR** acetaminophen, albuterol, ondansetron **OR** ondansetron (ZOFRAN) IV  Time spent: 35 minutes  Author: Val Riles. MD [facility] 05/04/2021 4:28 PM  To reach On-call, see care teams to locate the attending and reach out to them via [email or web address]. If 7PM-7AM, please contact night-coverage If you still have difficulty reaching the attending provider, please page the [facility] (Director on Call) for [facility] on amion for assistance.

## 2021-05-04 NOTE — ED Notes (Signed)
Lunch tray delivered to pt by dietary services.

## 2021-05-04 NOTE — Progress Notes (Signed)
PT Cancellation Note  Patient Details Name: Gary [name] MRN: [id number] DOB: May 09, 1955   Cancelled Treatment:    Reason Eval/Treat Not Completed: Patient declined, no reason specified. Patient states he has been walking at home and does not need anything right now. States he is here because his oxygen machine broke and will be fixed tomorrow and going back home. Will sign off at this time as he is refusing.     Kairi Harshbarger 05/04/2021, 12:21 PM

## 2021-05-04 NOTE — ED Notes (Signed)
Provider at bedside

## 2021-05-04 NOTE — ED Notes (Signed)
Respiratory called for Bipap

## 2021-05-04 NOTE — ED Notes (Signed)
Patient placed on hospital bed for comfort °

## 2021-05-05 ENCOUNTER — Encounter: Payer: Self-pay | Admitting: Internal Medicine

## 2021-05-05 ENCOUNTER — Inpatient Hospital Stay (HOSPITAL_COMMUNITY): Payer: Medicaid Other

## 2021-05-05 DIAGNOSIS — I5033 Acute on chronic diastolic (congestive) heart failure: Secondary | ICD-10-CM | POA: Diagnosis not present

## 2021-05-05 DIAGNOSIS — R079 Chest pain, unspecified: Secondary | ICD-10-CM | POA: Diagnosis not present

## 2021-05-05 LAB — LIPID PANEL
Cholesterol: 168 mg/dL (ref 0–200)
HDL: 47 mg/dL (ref >40)
LDL Cholesterol: 103 mg/dL — ABNORMAL HIGH (ref 0–99)
Total CHOL/HDL Ratio: 3.6 RATIO
Triglycerides: 91 mg/dL (ref <150)
VLDL: 18 mg/dL (ref 0–40)

## 2021-05-05 LAB — CBC
HCT: 45.7 % (ref 39.0–52.0)
Hemoglobin: 12.6 g/dL — ABNORMAL LOW (ref 13.0–17.0)
MCH: 23.1 pg — ABNORMAL LOW (ref 26.0–34.0)
MCHC: 27.6 g/dL — ABNORMAL LOW (ref 30.0–36.0)
MCV: 83.7 fL (ref 80.0–100.0)
Platelets: 146 10*3/uL — ABNORMAL LOW (ref 150–400)
RBC: 5.46 MIL/uL (ref 4.22–5.81)
RDW: 18.3 % — ABNORMAL HIGH (ref 11.5–15.5)
WBC: 11.3 10*3/uL — ABNORMAL HIGH (ref 4.0–10.5)
nRBC: 0 % (ref 0.0–0.2)

## 2021-05-05 LAB — BASIC METABOLIC PANEL
Anion gap: 8 (ref 5–15)
BUN: 31 mg/dL — ABNORMAL HIGH (ref 8–23)
CO2: 37 mmol/L — ABNORMAL HIGH (ref 22–32)
Calcium: 8.4 mg/dL — ABNORMAL LOW (ref 8.9–10.3)
Chloride: 97 mmol/L — ABNORMAL LOW (ref 98–111)
Creatinine, Ser: 1.5 mg/dL — ABNORMAL HIGH (ref 0.61–1.24)
GFR, Estimated: 52 mL/min — ABNORMAL LOW (ref >60)
Glucose, Bld: 99 mg/dL (ref 70–99)
Potassium: 3.7 mmol/L (ref 3.5–5.1)
Sodium: 142 mmol/L (ref 135–145)

## 2021-05-05 LAB — PHOSPHORUS: Phosphorus: 4.5 mg/dL (ref 2.5–4.6)

## 2021-05-05 LAB — HEMOGLOBIN A1C
Hgb A1c MFr Bld: 5.6 % (ref 4.8–5.6)
Mean Plasma Glucose: 114 mg/dL

## 2021-05-05 LAB — VITAMIN D 25 HYDROXY (VIT D DEFICIENCY, FRACTURES): Vit D, 25-Hydroxy: 13.06 ng/mL — ABNORMAL LOW (ref 30–100)

## 2021-05-05 LAB — MAGNESIUM: Magnesium: 1.9 mg/dL (ref 1.7–2.4)

## 2021-05-05 MED ORDER — FUROSEMIDE 10 MG/ML IJ SOLN
3.0000 mg/h | INTRAVENOUS | Status: DC
  Administered 2021-05-05 – 2021-05-07 (×2): 3 mg/h via INTRAVENOUS
  Filled 2021-05-05 (×2): qty 20

## 2021-05-05 MED ORDER — ALBUMIN HUMAN 25 % IV SOLN
12.5000 g | Freq: Four times a day (QID) | INTRAVENOUS | Status: AC
  Administered 2021-05-05 (×2): 12.5 g via INTRAVENOUS
  Filled 2021-05-05 (×2): qty 50

## 2021-05-05 MED ORDER — FUROSEMIDE 10 MG/ML IJ SOLN: 40.0000 mg | Freq: Every day | INTRAMUSCULAR | Status: DC

## 2021-05-05 MED ORDER — ADULT MULTIVITAMIN W/MINERALS CH
1.0000 | ORAL_TABLET | Freq: Every day | ORAL | Status: DC
  Administered 2021-05-05 – 2021-05-08 (×4): 1 via ORAL
  Filled 2021-05-05 (×4): qty 1

## 2021-05-05 MED ORDER — TECHNETIUM TC 99M TETROFOSMIN IV KIT
30.0000 | PACK | Freq: Once | INTRAVENOUS | Status: AC | PRN
  Administered 2021-05-05: 30.9 via INTRAVENOUS

## 2021-05-05 MED ORDER — REGADENOSON 0.4 MG/5ML IV SOLN
0.4000 mg | Freq: Once | INTRAVENOUS | Status: AC
  Administered 2021-05-05: 0.4 mg via INTRAVENOUS

## 2021-05-05 NOTE — TOC Progression Note (Signed)
Transition of Care [facility]) - Progression Note    Patient Details  Name: ADELL PANEK MRN: [id number] Date of Birth: 1955/12/31  [facility]) CM/SW Contact  Eileen Stanford, LCSW Phone Number: 05/05/2021, 1:33 PM  Clinical Narrative:   Ticket placed with [employer] to get the issues with the concentrator fixed.    Expected Discharge Plan: [place] Barriers to Discharge: Continued Medical Work up  Expected Discharge Plan and Services Expected Discharge Plan: [place]   Discharge Planning Services: CM Consult   Living arrangements for the past 2 months: Apartment                 DME Arranged: N/A         HH Arranged: RN [place]: [place] ([place]) Date [place]: 05/04/21 Time [place]: 1333 Representative spoke with at [place]: [place] ([place]) Interventions    Readmission Risk Interventions Readmission Risk Prevention Plan 05/04/2021 04/07/2021 03/12/2021  Transportation Screening Complete Complete Complete  PCP or Specialist Appt within 3-5 Days - Complete Complete  Not Complete comments - - -  HRI or [place] - - Complete  Social Work Consult for [place] Planning/Counseling - - Complete  Palliative Care Screening - Not Applicable Not Applicable  Medication Review Press photographer) Complete Complete Complete  PCP or Specialist appointment within 3-5 days of discharge Complete - -  [place] or Home Care Consult Complete - -  SW Recovery Care/Counseling Consult Complete - -  Palliative Care Screening Not Applicable - -  [place] Not Applicable - -  Some recent data might be hidden

## 2021-05-05 NOTE — Progress Notes (Signed)
Initial Nutrition Assessment  DOCUMENTATION CODES:   Morbid obesity  INTERVENTION:  Liberalize diet to 2 gram sodium  Double protein portions with meals MVI with minerals  Reviewed low sodium diet and CHF self-management    NUTRITION DIAGNOSIS:   Increased nutrient needs related to chronic illness (congestive heart failure) as evidenced by estimated needs.   GOAL:   Patient will meet greater than or equal to 90% of their needs   MONITOR:   PO intake, Labs, Weight trends, Skin, I & O's  REASON FOR ASSESSMENT:   Consult Assessment of nutrition requirement/status  ASSESSMENT:   Pt is a 66 y.o. male with PMH significant for diastolic CHF (EF 60 to 65%, G3 DD, 02/2021), HTN, COPD (on home 02 2 L), multiple gastric ulcers, hypoxia, GIB (s/p multiple transfusions), as well as prolonged hospitalization from 11/8-12/6/22 for ARDS related to [place] requiring mechanical ventilation and from 12/20 to 04/11/21 for CHF, who presented to the ED for SOB.   Visited with pt at bedside; pt alert and oriented. Per pt, pt ate [place] sausage, eggs, and grits for breakfast today. During visit, pt was finishing eating a cheeseburger, cole slaw, and garden salad for lunch. Pt reports that his appetite has been good since being hospitalized and PTA, he had a good appetite. Pt states that he lives at home with his girl friend and that she does most of the cooking; occasionally he cooks at home. Pt also states that he eats out occasionally. Pt states that he walks around independently at home, without any problems. Per pt, he reports that he is in the hospital because he was having malfunctions with his oxygen tank for 2-3 days PTA, which exacerbated his SOB. When pt is at home with his oxygen, pt reports that he feels well and does not have problems with his mobility.   Per weight hx, pt's current body weight is 182 kg (400#); trending down from 191.7 kg on 02/18/21. Pt reports that he thinks that his  weight is stable, but he admits that he hasn't been checking his weight daily because he did not have a scale. Per pt, RN provided pt a scale that he will use at home to weight himself daily.   Encouraged CHF management practices such as sodium restrictions and daily weights. Pt was able to teach back basic principles of low sodium diet and parameters of when to call MD for weight changes.   Due to increased nutrients needs, RD will liberalize diet to 2 gram sodium diet to provide wider variety of food selections and help pt meet nutritional goals.   Medications reviewed  Labs reviewed: Vitamin D 8.3, CBG 93  I/O: 960 mL UOP x 24 hours (0.2 ml/kg/hr)     NUTRITION - FOCUSED PHYSICAL EXAM:  Flowsheet Row Most Recent Value  Orbital Region No depletion  Upper Arm Region No depletion  Thoracic and Lumbar Region No depletion  Buccal Region No depletion  Temple Region No depletion  Clavicle Bone Region No depletion  Clavicle and Acromion Bone Region No depletion  Scapular Bone Region No depletion  Dorsal Hand No depletion  Patellar Region No depletion  Anterior Thigh Region No depletion  Posterior Calf Region Mild depletion  Edema (RD Assessment) Moderate  Hair Reviewed  Eyes Reviewed  Mouth Reviewed  Skin Reviewed  Nails Reviewed       Diet Order:   Diet Order             Diet Heart Room  service appropriate? Yes; Fluid consistency: Thin  Diet effective now                   EDUCATION NEEDS:   Education needs have been addressed  Skin:  Skin Assessment: Reviewed RN Assessment  Last BM:  1/19  Height:   Ht Readings from Last 1 Encounters:  05/03/21 6\' 3"  (1.905 m)    Weight:   Wt Readings from Last 1 Encounters:  05/05/21 (!) 182 kg    Ideal Body Weight:  89 kg  BMI:  Body mass index is 50.15 kg/m.  Estimated Nutritional Needs:   Kcal:  2250 - 2450  Protein:  130 - 145 grams  Fluid:  > 2 L     Maryruth Hancock, Dietetic Intern 05/05/2021  5:02 PM

## 2021-05-05 NOTE — Progress Notes (Signed)
[facility] Progress Note  Patient: Gary [name]    MCN:[id number]  DOA: 05/03/2021     Date of Service: the patient was seen and examined on 05/05/2021  Chief Complaint  Patient presents with   Chest Pain   Brief hospital course: Gary [name] is a 66 y.o. male with medical history significant for Diastolic CHF (EF 60 to 62%, G3 DD, 02/2021), COPD on home O2 2 L, HTN, OSA, PVD, as well as prolonged hospitalization from 11/8-12/6/22 for ARDS related to [place] requiring mechanical ventilation and another from 12/20 to 04/11/2021 for CHF, who comes in for evaluation of shortness of breath.  Per chart review patient called his PCPs office a couple days prior with a complaint of fluid retention, and feeling swollen and reported that at the time he was not using his torsemide twice daily as recommended.  He also declined a recommendation to go into the office for IV Lasix as he said IV Lasix does not work for him.  The office advised that he go to the emergency room if symptoms worsen.  Patient states he started having shortness of breath and tried using his nebulizer but it was not working.  On the day of arrival he called EMS due to worsening symptoms.  On arrival of EMS his O2 sat was 84 to 85% on his home O2 t He denies chest pain, cough or fever.  Denies abdominal pain, diarrhea, nausea or vomiting.   ED course: On arrival in the ED, tachypneic to 28 with O2 sat 90 to 91% on 4 L. Blood work: Troponin 8, BNP 47, bicarb 41.  Otherwise mostly unremarkable   EKG, personally reviewed and interpreted: NSR at 91 with nonspecific ST-T wave changes   Imaging: Cardiomegaly Central venous congestion No significant change from comparison exam on 12/23   Patient was treated in the ED with IV Lasix as well as duo nebs and Solu-Medrol.  Hospitalist consulted for admission.    Assessment and Plan:  Acute on chronic respiratory failure with hypoxia  - Multifactorial related to diastolic CHF  exacerbation and COPD exacerbation - Supplemental oxygen to keep sats above 92 - Treat each etiology as outlined below - We will need to check home oxygen supplies at discharge  Chest pain, troponin negative  No significant EKG changes Continue aspirin 80 mg daily, nitroglycerin sublingual as needed Patient had no any prior cardiac work-up NM Lexiscan done today, report is pending   acute on chronic diastolic congestive heart failure Patient is on torsemide at home, patient missed 1 dose on Monday, stated that he was drinking water about 3 L and more for that day. Held torsemide for now S/p Lasix 60 mg given in the ED, s/p Lasix 40 mg IV daily - Continue carvedilol,  Discontinued losartan due to potassium 5.1, risk of hyperkalemia and renal failure - Daily weights with intake and output monitoring - Echo 02/2021 with EF 60 to 65% and G3 DD Follow repeat 2D echocardiogram 1/20 started IV Lasix infusion 3 mg/h Creatinine 1.5 slightly elevated, we will give albumin 12.5 g x 2 doses   COPD with acute exacerbation ([name]) - DuoNebs scheduled and as needed - Methylprednisolone to prednisone  HTN (hypertension) - Continue carvedilol,  Discontinued losartan due to high potassium level and AKI  Morbid obesity with BMI of 94.7-65.4, adult Complicating factor to overall prognosis and care.  Calorie restricted diet and daily exercise advised to lose body weight  Obstructive sleep apnea Continue nightly CPAP  Body mass index is 51.87 kg/m.  Interventions:       Diet: Heart healthy, fluid restriction 1.5 L/day DVT Prophylaxis: Subcutaneous Lovenox   Advance goals of care discussion: Full code  Family Communication: family was NOT present at bedside, at the time of interview.  The pt provided permission to discuss medical plan with the family. Opportunity was given to ask question and all questions were answered satisfactorily.   Disposition:  Pt is from Home, admitted with  Resp failure, diastolic CHF exacerbation, still has difficulty breathing on IV Lasix, which precludes a safe discharge. Discharge to home, when improved clinically, may require 1 to 2 days more.  Subjective: No significant events overnight, patient is feeling improvement in the shortness of breath, patient was seen after Lexiscan stress test, tolerated procedure well.    Physical Exam: General:  alert oriented to time, place, and person.  Appear in mild distress, affect appropriate Eyes: PERRLA ENT: Oral Mucosa Clear, moist  Neck: no JVD,  Cardiovascular: S1 and S2 Present, no Murmur,  Respiratory: increased respiratory effort, Bilateral Air entry equal and Decreased, mild Crackles, mild wheezes Abdomen: Bowel Sound present, Soft and no tenderness,  Skin: no rashes Extremities: mild Pedal edema, no calf tenderness Neurologic: without any new focal findings Gait not checked due to patient safety concerns  Vitals:   05/05/21 0726 05/05/21 0734 05/05/21 1204 05/05/21 1346  BP:  120/88 108/77   Pulse: 84 82 89   Resp: 20  17   Temp:  97.7 F (36.5 C) 97.8 F (36.6 C)   TempSrc:  Oral    SpO2: 100% 96% 96% 95%  Weight:      Height:        Intake/Output Summary (Last 24 hours) at 05/05/2021 1550 Last data filed at 05/05/2021 1300 Gross per 24 hour  Intake 240 ml  Output 1260 ml  Net -1020 ml   Filed Weights   05/03/21 1853 05/04/21 1743 05/05/21 0500  Weight: (!) 188.2 kg (!) 181.2 kg (!) 182 kg    Data Reviewed: I have personally reviewed and interpreted daily labs, tele strips, imagings as discussed above. I reviewed all nursing notes, pharmacy notes, vitals, pertinent old records I have discussed plan of care as described above with RN and patient/family.  CBC: Recent Labs  Lab 05/03/21 1911 05/04/21 0630 05/05/21 0638  WBC 8.2 7.3 11.3*  HGB 13.4 14.4 12.6*  HCT 50.1 53.3* 45.7  MCV 87.3 85.8 83.7  PLT 147* 132* 270*   Basic Metabolic Panel: Recent Labs   Lab 05/03/21 1911 05/04/21 0630 05/05/21 0638  NA 141 139 142  K 3.5 5.1 3.7  CL 95* 96* 97*  CO2 41* 33* 37*  GLUCOSE 112* 140* 99  BUN 17 19 31*  CREATININE 1.22 1.28* 1.50*  CALCIUM 8.6* 8.5* 8.4*  MG  --  2.0 1.9  PHOS  --  4.8* 4.5    Studies: No results found.  Scheduled Meds:  aspirin EC  81 mg Oral Daily   carvedilol  6.25 mg Oral Daily   enoxaparin (LOVENOX) injection  0.5 mg/kg Subcutaneous Q24H   ipratropium-albuterol  3 mL Nebulization Q6H   pantoprazole  40 mg Oral Daily   predniSONE  40 mg Oral Q breakfast   simvastatin  10 mg Oral q1800   Continuous Infusions:  albumin human     furosemide (LASIX) 200 mg in dextrose 5% 100 mL (2mg /mL) infusion 3 mg/hr (05/05/21 1207)   PRN Meds: acetaminophen **OR** acetaminophen,  albuterol, nitroGLYCERIN, ondansetron **OR** ondansetron (ZOFRAN) IV  Time spent: 35 minutes  Author: Val Riles. MD [facility] 05/05/2021 3:50 PM  To reach On-call, see care teams to locate the attending and reach out to them via [email or web address]. If 7PM-7AM, please contact night-coverage If you still have difficulty reaching the attending provider, please page the [name] (Director on Call) for [facility] on amion for assistance.

## 2021-05-05 NOTE — Consult Note (Addendum)
° °  Heart Failure Nurse Navigator Note  Met with patient today,by teach back method went over how he is going to take care of himself including daily weights, low-sodium diet, fluid restriction, being compliant with medications, signs and symptoms to report and weight increase to report.  Patient did not need any reinforcement.  Was given a scale to take home for weighing himself daily.  Pricilla Riffle RN CHFN

## 2021-05-06 ENCOUNTER — Other Ambulatory Visit: Payer: Medicaid Other

## 2021-05-06 DIAGNOSIS — I5033 Acute on chronic diastolic (congestive) heart failure: Secondary | ICD-10-CM | POA: Diagnosis not present

## 2021-05-06 LAB — CBC
HCT: 46.1 % (ref 39.0–52.0)
Hemoglobin: 12.4 g/dL — ABNORMAL LOW (ref 13.0–17.0)
MCH: 23.1 pg — ABNORMAL LOW (ref 26.0–34.0)
MCHC: 26.9 g/dL — ABNORMAL LOW (ref 30.0–36.0)
MCV: 86 fL (ref 80.0–100.0)
Platelets: 132 10*3/uL — ABNORMAL LOW (ref 150–400)
RBC: 5.36 MIL/uL (ref 4.22–5.81)
RDW: 17.7 % — ABNORMAL HIGH (ref 11.5–15.5)
WBC: 7.9 10*3/uL (ref 4.0–10.5)
nRBC: 0 % (ref 0.0–0.2)

## 2021-05-06 LAB — NM MYOCAR SINGLE W/SPECT
LV dias vol: 152 mL (ref 62–150)
LV sys vol: 56 mL
Nuc Stress EF: 63 %
Peak HR: 94 {beats}/min
Percent HR: 59 %
Rest HR: 85 {beats}/min
SSS: 6
ST Depression (mm): 0 mm
Stress Nuclear Isotope Dose: 30.9 mCi

## 2021-05-06 LAB — BASIC METABOLIC PANEL
Anion gap: 13 (ref 5–15)
BUN: 34 mg/dL — ABNORMAL HIGH (ref 8–23)
CO2: 32 mmol/L (ref 22–32)
Calcium: 8.1 mg/dL — ABNORMAL LOW (ref 8.9–10.3)
Chloride: 94 mmol/L — ABNORMAL LOW (ref 98–111)
Creatinine, Ser: 1.42 mg/dL — ABNORMAL HIGH (ref 0.61–1.24)
GFR, Estimated: 56 mL/min — ABNORMAL LOW (ref >60)
Glucose, Bld: 82 mg/dL (ref 70–99)
Potassium: 4.6 mmol/L (ref 3.5–5.1)
Sodium: 139 mmol/L (ref 135–145)

## 2021-05-06 MED ORDER — VITAMIN D (ERGOCALCIFEROL) 1.25 MG (50000 UNIT) PO CAPS
50000.0000 [IU] | ORAL_CAPSULE | ORAL | Status: DC
  Administered 2021-05-06: 50000 [IU] via ORAL
  Filled 2021-05-06 (×2): qty 1

## 2021-05-06 NOTE — TOC Progression Note (Signed)
Transition of Care [facility]) - Progression Note    Patient Details  Name: Gary [name] MRN: [id number] Date of Birth: [birth year]  Transition of Care [facility]) CM/SW [place], LCSW Phone Number: 05/06/2021, 9:41 AM  Clinical Narrative:  Reached out to Corene Cornea at [facility] who confirmed they can take patient.    Expected Discharge Plan: [place] Barriers to Discharge: Continued Medical Work up  Expected Discharge Plan and Services Expected Discharge Plan: [place]   Discharge Planning Services: CM Consult   Living arrangements for the past 2 months: Apartment                 DME Arranged: N/A         HH Arranged: RN [place]: [place] ([place]) Date [place]: 05/04/21 Time [place]: 1333 Representative spoke with at [place]: [place] ([place]) Interventions    Readmission Risk Interventions Readmission Risk Prevention Plan 05/04/2021 04/07/2021 03/12/2021  Transportation Screening Complete Complete Complete  PCP or Specialist Appt within 3-5 Days - Complete Complete  Not Complete comments - - -  HRI or [place] - - Complete  Social Work Consult for [place] Planning/Counseling - - Complete  Palliative Care Screening - Not Applicable Not Applicable  Medication Review Press photographer) Complete Complete Complete  PCP or Specialist appointment within 3-5 days of discharge Complete - -  [place] or Home Care Consult Complete - -  SW Recovery Care/Counseling Consult Complete - -  Palliative Care Screening Not Applicable - -  [place] Not Applicable - -  Some recent data might be hidden

## 2021-05-06 NOTE — Progress Notes (Signed)
[facility] Progress Note  Patient: Gary [name] [name]    GYI:[id number]  DOA: 05/03/2021     Date of Service: Gary [name] patient was seen and examined on 05/06/2021  Chief Complaint  Patient presents with   Chest Pain   Brief hospital course: Gary [name] [name] is a 66 y.o. male with medical history significant for Diastolic CHF (EF 60 to 35%, G3 DD, 02/2021), COPD on home O2 2 L, HTN, OSA, PVD, as well as prolonged hospitalization from 11/8-12/6/22 for ARDS related to [place] requiring mechanical ventilation and another from 12/20 to 04/11/2021 for CHF, who comes in for evaluation of shortness of breath.  Per chart review patient called his PCPs office a couple days prior with a complaint of fluid retention, and feeling swollen and reported that at Gary [name] time he was not using his torsemide twice daily as recommended.  He also declined a recommendation to go into Gary [name] office for IV Lasix as he said IV Lasix does not work for him.  Gary [name] office advised that he go to Gary [name] emergency room if symptoms worsen.  Patient states he started having shortness of breath and tried using his nebulizer but it was not working.  On Gary [name] day of arrival he called EMS due to worsening symptoms.  On arrival of EMS his O2 sat was 84 to 85% on his home O2 t He denies chest pain, cough or fever.  Denies abdominal pain, diarrhea, nausea or vomiting.   ED course: On arrival in Gary [name] ED, tachypneic to 28 with O2 sat 90 to 91% on 4 L. Blood work: Troponin 8, BNP 47, bicarb 41.  Otherwise mostly unremarkable   EKG, personally reviewed and interpreted: NSR at 91 with nonspecific ST-T wave changes   Imaging: Cardiomegaly Central venous congestion No significant change from comparison exam on 12/23   Patient was treated in Gary [name] ED with IV Lasix as well as duo nebs and Solu-Medrol.  Hospitalist consulted for admission.    Assessment and Plan:  Acute on chronic respiratory failure with hypoxia  - Multifactorial related to diastolic CHF  exacerbation and COPD exacerbation - Supplemental oxygen to keep sats above 92 - Treat each etiology as outlined below - We will need to check home oxygen supplies at discharge  Chest pain, troponin negative  No significant EKG changes Continue aspirin 80 mg daily, nitroglycerin sublingual as needed Patient had no any prior cardiac work-up NM Lexiscan done, which is negative for any ischemic findings as per cardiology, official report was pending.     acute on chronic diastolic congestive heart failure Patient is on torsemide at home, patient missed 1 dose on Monday, stated that he was drinking water about 3 L and more for that day. Held torsemide for now S/p Lasix 60 mg given in Gary [name] ED, s/p Lasix 40 mg IV daily - Continue carvedilol,  Discontinued losartan due to potassium 5.1, risk of hyperkalemia and renal failure - Daily weights with intake and output monitoring - Echo 02/2021 with EF 60 to 65% and G3 DD Follow repeat 2D echocardiogram 1/20 started IV Lasix infusion 3 mg/h Creatinine 1.5 --1.42 still elevated, s/p albumin 12.5 g x 2 doses   COPD with acute exacerbation ([name]) - DuoNebs scheduled and as needed - Methylprednisolone to prednisone  HTN (hypertension) - Continue carvedilol,  Discontinued losartan due to high potassium level and AKI  Morbid obesity with BMI of 00.9-38.1, adult Complicating factor to overall prognosis and care.  Calorie restricted diet and daily exercise advised  to lose body weight   Obstructive sleep apnea Continue nightly CPAP    Vitamin D deficiency, started vitamin D 5000 units p.o. weekly.  Follow with PCP to repeat vitamin D level after 3 months.  Body mass index is 51.87 kg/m.  Interventions:       Diet: Heart healthy, fluid restriction 1.5 L/day DVT Prophylaxis: Subcutaneous Lovenox   Advance goals of care discussion: Full code  Family Communication: family was NOT present at bedside, at Gary [name] time of interview.  Gary [name] [name]  provided permission to discuss medical plan with Gary [name] family. Opportunity was given to ask question and all questions were answered satisfactorily.   Disposition:  [name] is from Home, admitted with Resp failure, diastolic CHF exacerbation, still has difficulty breathing on IV Lasix infusion, which precludes a safe discharge. Discharge to home, when improved clinically, may require 1 to 2 days more.  Subjective: No significant events overnight, patient is feeling improvement in Gary [name] shortness of breath, denied any chest pain or palpitations. Patient is willing to stay 1 more day on IV Lasix infusion, we will plan to discharge him tomorrow a.m. if clinically stable.     Physical Exam: General:  alert oriented to time, place, and person.  Appear in mild distress, affect appropriate Eyes: PERRLA ENT: Oral Mucosa Clear, moist  Neck: no JVD,  Cardiovascular: S1 and S2 Present, no Murmur,  Respiratory: increased respiratory effort, Bilateral Air entry equal and Decreased, mild Crackles, mild wheezes Abdomen: Bowel Sound present, Soft and no tenderness,  Skin: no rashes Extremities: mild Pedal edema, no calf tenderness Neurologic: without any new focal findings Gait not checked due to patient safety concerns  Vitals:   05/06/21 0748 05/06/21 1100 05/06/21 1349 05/06/21 1526  BP:  122/84  112/64  Pulse:  82  78  Resp:  18  18  Temp:  98.2 F (36.8 C)  97.9 F (36.6 C)  TempSrc:  Oral    SpO2: 94% 94% 94% 96%  Weight:      Height:        Intake/Output Summary (Last 24 hours) at 05/06/2021 1545 Last data filed at 05/06/2021 1525 Gross per 24 hour  Intake 1731.5 ml  Output 2650 ml  Net -918.5 ml   Filed Weights   05/04/21 1743 05/05/21 0500 05/06/21 0500  Weight: (!) 181.2 kg (!) 182 kg (!) 181.6 kg    Data Reviewed: I have personally reviewed and interpreted daily labs, tele strips, imagings as discussed above. I reviewed all nursing notes, pharmacy notes, vitals, pertinent old  records I have discussed plan of care as described above with RN and patient/family.  CBC: Recent Labs  Lab 05/03/21 1911 05/04/21 0630 05/05/21 0638 05/06/21 0952  WBC 8.2 7.3 11.3* 7.9  HGB 13.4 14.4 12.6* 12.4*  HCT 50.1 53.3* 45.7 46.1  MCV 87.3 85.8 83.7 86.0  PLT 147* 132* 146* 025*   Basic Metabolic Panel: Recent Labs  Lab 05/03/21 1911 05/04/21 0630 05/05/21 0638 05/06/21 0658  NA 141 139 142 139  K 3.5 5.1 3.7 4.6  CL 95* 96* 97* 94*  CO2 41* 33* 37* 32  GLUCOSE 112* 140* 99 82  BUN 17 19 31* 34*  CREATININE 1.22 1.28* 1.50* 1.42*  CALCIUM 8.6* 8.5* 8.4* 8.1*  MG  --  2.0 1.9  --   PHOS  --  4.8* 4.5  --     Studies: No results found.  Scheduled Meds:  aspirin EC  81 mg Oral Daily  carvedilol  6.25 mg Oral Daily   enoxaparin (LOVENOX) injection  0.5 mg/kg Subcutaneous Q24H   ipratropium-albuterol  3 mL Nebulization Q6H   multivitamin with minerals  1 tablet Oral Daily   pantoprazole  40 mg Oral Daily   predniSONE  40 mg Oral Q breakfast   simvastatin  10 mg Oral q1800   Vitamin D (Ergocalciferol)  50,000 Units Oral Q7 days   Continuous Infusions:  furosemide (LASIX) 200 mg in dextrose 5% 100 mL (2mg /mL) infusion 3 mg/hr (05/05/21 1207)   PRN Meds: acetaminophen **OR** acetaminophen, albuterol, nitroGLYCERIN, ondansetron **OR** ondansetron (ZOFRAN) IV  Time spent: 35 minutes  Author: Val Riles. MD [facility] 05/06/2021 3:45 PM  To reach On-call, see care teams to locate Gary [name] attending and reach out to them via [email or web address]. If 7PM-7AM, please contact night-coverage If you still have difficulty reaching Gary [name] attending provider, please page Gary [name] [facility] (Director on Call) for [facility] on amion for assistance.

## 2021-05-07 DIAGNOSIS — I5033 Acute on chronic diastolic (congestive) heart failure: Secondary | ICD-10-CM | POA: Diagnosis not present

## 2021-05-07 LAB — BASIC METABOLIC PANEL
Anion gap: 7 (ref 5–15)
BUN: 39 mg/dL — ABNORMAL HIGH (ref 8–23)
CO2: 36 mmol/L — ABNORMAL HIGH (ref 22–32)
Calcium: 8 mg/dL — ABNORMAL LOW (ref 8.9–10.3)
Chloride: 95 mmol/L — ABNORMAL LOW (ref 98–111)
Creatinine, Ser: 1.37 mg/dL — ABNORMAL HIGH (ref 0.61–1.24)
GFR, Estimated: 58 mL/min — ABNORMAL LOW (ref >60)
Glucose, Bld: 88 mg/dL (ref 70–99)
Potassium: 3.9 mmol/L (ref 3.5–5.1)
Sodium: 138 mmol/L (ref 135–145)

## 2021-05-07 LAB — CBC
HCT: 45 % (ref 39.0–52.0)
Hemoglobin: 12 g/dL — ABNORMAL LOW (ref 13.0–17.0)
MCH: 23 pg — ABNORMAL LOW (ref 26.0–34.0)
MCHC: 26.7 g/dL — ABNORMAL LOW (ref 30.0–36.0)
MCV: 86.2 fL (ref 80.0–100.0)
Platelets: 134 10*3/uL — ABNORMAL LOW (ref 150–400)
RBC: 5.22 MIL/uL (ref 4.22–5.81)
RDW: 17.7 % — ABNORMAL HIGH (ref 11.5–15.5)
WBC: 8.5 10*3/uL (ref 4.0–10.5)
nRBC: 0 % (ref 0.0–0.2)

## 2021-05-07 NOTE — TOC Transition Note (Signed)
Transition of Care [facility]) - CM/SW Discharge Note   Patient Details  Name: Gary [name] MRN: [id number] Date of Birth: [birth year]  Transition of Care [facility]) CM/SW Contact:  Magnus Ivan, LCSW Phone Number: 05/07/2021, 11:26 AM   Clinical Narrative:   Patient to DC home today. CSW called Jasmine with [employer] who confirms they have scheduled a visit for tomorrow 1/23 to fix patient's home o2 set up. Patient needs a transport tank. Jasmine confirmed they will deliver one to bedside today prior to DC.  Notified [facility]ne Cornea with [facility] of DC home today. No additional TOC needs identified.    Final next level of care: [place] Barriers to Discharge: Barriers Resolved   Patient Goals and CMS Choice Patient states their goals for this hospitalization and ongoing recovery are:: Patient wants to get back home      Discharge Placement                       Discharge Plan and Services   Discharge Planning Services: CM Consult            DME Arranged: Oxygen DME Agency: [employer] Date DME Agency Contacted: 05/07/21   Representative spoke with at DME Agency: [place]: RN [place]: [place] ([place]) Date [place]: 05/07/21 Time [place]: [place] Representative spoke with at [place]: [place] ([place]) Interventions     Readmission Risk Interventions Readmission Risk Prevention Plan 05/04/2021 04/07/2021 03/12/2021  Transportation Screening Complete Complete Complete  PCP or Specialist Appt within 3-5 Days - Complete Complete  Not Complete comments - - -  HRI or [place] - - Complete  Social Work Consult for [place] Planning/Counseling - - Complete  Palliative Care Screening - Not Applicable Not Applicable  Medication Review Press photographer) Complete Complete Complete  PCP or Specialist appointment within 3-5 days of discharge Complete - -  [place] or Home Care  Consult Complete - -  SW Recovery Care/Counseling Consult Complete - -  Palliative Care Screening Not Applicable - -  [place] Not Applicable - -  Some recent data might be hidden

## 2021-05-07 NOTE — Progress Notes (Signed)
[facility] Progress Note  Patient: Gary [name]    OJJ:[id number]  DOA: 05/03/2021     Date of Service: the patient was seen and examined on 05/07/2021  Chief Complaint  Patient presents with   Chest Pain   Brief hospital course: Gary [name] is a 66 y.o. male with medical history significant for Diastolic CHF (EF 60 to 93%, G3 DD, 02/2021), COPD on home O2 2 L, HTN, OSA, PVD, as well as prolonged hospitalization from 11/8-12/6/22 for ARDS related to [place] requiring mechanical ventilation and another from 12/20 to 04/11/2021 for CHF, who comes in for evaluation of shortness of breath.  Per chart review patient called his PCPs office a couple days prior with a complaint of fluid retention, and feeling swollen and reported that at the time he was not using his torsemide twice daily as recommended.  He also declined a recommendation to go into the office for IV Lasix as he said IV Lasix does not work for him.  The office advised that he go to the emergency room if symptoms worsen.  Patient states he started having shortness of breath and tried using his nebulizer but it was not working.  On the day of arrival he called EMS due to worsening symptoms.  On arrival of EMS his O2 sat was 84 to 85% on his home O2 t He denies chest pain, cough or fever.  Denies abdominal pain, diarrhea, nausea or vomiting.   ED course: On arrival in the ED, tachypneic to 28 with O2 sat 90 to 91% on 4 L. Blood work: Troponin 8, BNP 47, bicarb 41.  Otherwise mostly unremarkable   EKG, personally reviewed and interpreted: NSR at 91 with nonspecific ST-T wave changes   Imaging: Cardiomegaly Central venous congestion No significant change from comparison exam on 12/23   Patient was treated in the ED with IV Lasix as well as duo nebs and Solu-Medrol.  Hospitalist consulted for admission.    Assessment and Plan:  Acute on chronic respiratory failure with hypoxia  - Multifactorial related to diastolic CHF  exacerbation and COPD exacerbation - Supplemental oxygen to keep sats above 92 - Treat each etiology as outlined below - We will need to check home oxygen supplies at discharge  Chest pain, troponin negative  No significant EKG changes Continue aspirin 80 mg daily, nitroglycerin sublingual as needed Patient had no any prior cardiac work-up NM Lexiscan done, which is negative for any ischemic findings as per cardiology, official report was pending.     acute on chronic diastolic congestive heart failure Patient is on torsemide at home, patient missed 1 dose on Monday, stated that he was drinking water about 3 L and more for that day. Held torsemide for now S/p Lasix 60 mg given in the ED, s/p Lasix 40 mg IV daily - Continue carvedilol,  Discontinued losartan due to potassium 5.1, risk of hyperkalemia and renal failure - Daily weights with intake and output monitoring - Echo 02/2021 with EF 60 to 65% and G3 DD Follow repeat 2D echocardiogram 1/20 started IV Lasix infusion 3 mg/h  s/p albumin 12.5 g x 2 doses Creatinine 1.5 --1.42--1.37 still elevated but gradually improving  COPD with acute exacerbation ([place]) - DuoNebs scheduled and as needed - Methylprednisolone to prednisone  HTN (hypertension) - Continue carvedilol,  Discontinued losartan due to high potassium level and AKI  Morbid obesity with BMI of 71.6-96.7, adult Complicating factor to overall prognosis and care.  Calorie restricted diet and  daily exercise advised to lose body weight   Obstructive sleep apnea Continue nightly CPAP    Vitamin D deficiency, started vitamin D 5000 units p.o. weekly.  Follow with PCP to repeat vitamin D level after 3 months.  Body mass index is 51.87 kg/m.  Interventions:       Diet: Heart healthy, fluid restriction 1.5 L/day DVT Prophylaxis: Subcutaneous Lovenox   Advance goals of care discussion: Full code  Family Communication: family was NOT present at bedside, at the time  of interview.  The pt provided permission to discuss medical plan with the family. Opportunity was given to ask question and all questions were answered satisfactorily.   Disposition:  Pt is from Home, admitted with Resp failure, diastolic CHF exacerbation, still has difficulty breathing on IV Lasix infusion, which precludes a safe discharge. Discharge to home, when improved clinically, may require 1 to 2 days more.  Subjective: No significant events overnight, patient is feeling improvement in the shortness of breath, denied any chest pain or palpitations. Patient is willing to stay 1 more day on IV Lasix infusion, we will plan to discharge him tomorrow a.m. if clinically stable.     Physical Exam: General:  alert oriented to time, place, and person.  Appear in mild distress, affect appropriate Eyes: PERRLA ENT: Oral Mucosa Clear, moist  Neck: no JVD,  Cardiovascular: S1 and S2 Present, no Murmur,  Respiratory: increased respiratory effort, Bilateral Air entry equal and Decreased, mild Crackles, mild wheezes Abdomen: Bowel Sound present, Soft and no tenderness,  Skin: no rashes Extremities: mild Pedal edema, no calf tenderness Neurologic: without any new focal findings Gait not checked due to patient safety concerns  Vitals:   05/07/21 0739 05/07/21 0858 05/07/21 1145 05/07/21 1401  BP:  112/75 113/74   Pulse:  83 75   Resp:  18 18   Temp:  98.1 F (36.7 C) 98.3 F (36.8 C)   TempSrc:      SpO2: 96% 97% 92% 96%  Weight:      Height:        Intake/Output Summary (Last 24 hours) at 05/07/2021 1437 Last data filed at 05/07/2021 1154 Gross per 24 hour  Intake 1233.92 ml  Output 2620 ml  Net -1386.08 ml   Filed Weights   05/05/21 0500 05/06/21 0500 05/07/21 0500  Weight: (!) 182 kg (!) 181.6 kg (!) 183.3 kg    Data Reviewed: I have personally reviewed and interpreted daily labs, tele strips, imagings as discussed above. I reviewed all nursing notes, pharmacy notes,  vitals, pertinent old records I have discussed plan of care as described above with RN and patient/family.  CBC: Recent Labs  Lab 05/03/21 1911 05/04/21 0630 05/05/21 0638 05/06/21 0952 05/07/21 0747  WBC 8.2 7.3 11.3* 7.9 8.5  HGB 13.4 14.4 12.6* 12.4* 12.0*  HCT 50.1 53.3* 45.7 46.1 45.0  MCV 87.3 85.8 83.7 86.0 86.2  PLT 147* 132* 146* 132* 160*   Basic Metabolic Panel: Recent Labs  Lab 05/03/21 1911 05/04/21 0630 05/05/21 0638 05/06/21 0658 05/07/21 0747  NA 141 139 142 139 138  K 3.5 5.1 3.7 4.6 3.9  CL 95* 96* 97* 94* 95*  CO2 41* 33* 37* 32 36*  GLUCOSE 112* 140* 99 82 88  BUN 17 19 31* 34* 39*  CREATININE 1.22 1.28* 1.50* 1.42* 1.37*  CALCIUM 8.6* 8.5* 8.4* 8.1* 8.0*  MG  --  2.0 1.9  --   --   PHOS  --  4.8* 4.5  --   --  Studies: No results found.  Scheduled Meds:  aspirin EC  81 mg Oral Daily   carvedilol  6.25 mg Oral Daily   enoxaparin (LOVENOX) injection  0.5 mg/kg Subcutaneous Q24H   ipratropium-albuterol  3 mL Nebulization Q6H   multivitamin with minerals  1 tablet Oral Daily   pantoprazole  40 mg Oral Daily   predniSONE  40 mg Oral Q breakfast   simvastatin  10 mg Oral q1800   Vitamin D (Ergocalciferol)  50,000 Units Oral Q7 days   Continuous Infusions:  furosemide (LASIX) 200 mg in dextrose 5% 100 mL (2mg /mL) infusion 3 mg/hr (05/06/21 1837)   PRN Meds: acetaminophen **OR** acetaminophen, albuterol, nitroGLYCERIN, ondansetron **OR** ondansetron (ZOFRAN) IV  Time spent: 35 minutes  Author: Val Riles. MD [facility] 05/07/2021 2:37 PM  To reach On-call, see care teams to locate the attending and reach out to them via [email or web address]. If 7PM-7AM, please contact night-coverage If you still have difficulty reaching the attending provider, please page the [facility] (Director on Call) for [facility] on amion for assistance.

## 2021-05-08 ENCOUNTER — Other Ambulatory Visit: Payer: Self-pay | Admitting: Student

## 2021-05-08 DIAGNOSIS — J9621 Acute and chronic respiratory failure with hypoxia: Secondary | ICD-10-CM

## 2021-05-08 LAB — CBC
HCT: 45.1 % (ref 39.0–52.0)
Hemoglobin: 12.3 g/dL — ABNORMAL LOW (ref 13.0–17.0)
MCH: 23.6 pg — ABNORMAL LOW (ref 26.0–34.0)
MCHC: 27.3 g/dL — ABNORMAL LOW (ref 30.0–36.0)
MCV: 86.4 fL (ref 80.0–100.0)
Platelets: 138 10*3/uL — ABNORMAL LOW (ref 150–400)
RBC: 5.22 MIL/uL (ref 4.22–5.81)
RDW: 17.5 % — ABNORMAL HIGH (ref 11.5–15.5)
WBC: 9 10*3/uL (ref 4.0–10.5)
nRBC: 0 % (ref 0.0–0.2)

## 2021-05-08 LAB — BASIC METABOLIC PANEL
Anion gap: 6 (ref 5–15)
BUN: 36 mg/dL — ABNORMAL HIGH (ref 8–23)
CO2: 36 mmol/L — ABNORMAL HIGH (ref 22–32)
Calcium: 8.1 mg/dL — ABNORMAL LOW (ref 8.9–10.3)
Chloride: 96 mmol/L — ABNORMAL LOW (ref 98–111)
Creatinine, Ser: 1.31 mg/dL — ABNORMAL HIGH (ref 0.61–1.24)
GFR, Estimated: >60 mL/min (ref >60)
Glucose, Bld: 97 mg/dL (ref 70–99)
Potassium: 3.9 mmol/L (ref 3.5–5.1)
Sodium: 138 mmol/L (ref 135–145)

## 2021-05-08 MED ORDER — IPRATROPIUM-ALBUTEROL 0.5-2.5 (3) MG/3ML IN SOLN
3.0000 mL | Freq: Three times a day (TID) | RESPIRATORY_TRACT | Status: DC
  Filled 2021-05-08: qty 3

## 2021-05-08 MED ORDER — VITAMIN D (ERGOCALCIFEROL) 1.25 MG (50000 UNIT) PO CAPS: 50000.0000 [IU] | ORAL_CAPSULE | ORAL | 0 refills | Status: DC

## 2021-05-08 NOTE — Discharge Summary (Signed)
[facility] Discharge Summary   Patient: Gary [name] TGY:[id number]  PCP: [facility]  Date of admission: 05/03/2021   Date of discharge:  05/08/2021     Discharge Diagnoses:  Principal Problem:   Acute on chronic diastolic congestive heart failure ([place]) Active Problems:   Morbid obesity with BMI of 50.0-59.9, adult ([name])   HTN (hypertension)   Acute on chronic respiratory failure with hypoxia ([name])   Obstructive sleep apnea   Chronic pain syndrome   COPD with acute exacerbation ([place])   CHF exacerbation ([place])   Admitted From: Home Disposition:  Home with [facility] PT  Recommendations for Outpatient Follow-up:  Follow-up with PCP in 1 week, continue monitor BP at home and follow with the patient.  Medication accordingly. Repeat BMP after 1 week to check renal functions.  Discontinued losartan secondary to AKI and hyperkalemia Follow with nephrology in 1 to 2 weeks Follow-up with cardiology in 1 to 2 weeks    Diet recommendation: Cardiac diet, fluid restriction one-point [place] per day  Activity: The patient is advised to gradually reintroduce usual activities, as tolerated  Discharge Condition: stable  Code Status: Full code   History of present illness: As per the H and P dictated on admission Hospital Course:  Gary [name] is a 66 y.o. male with medical history significant for Diastolic CHF (EF 60 to 28%, G3 DD, 02/2021), COPD on home O2 2 L, HTN, OSA, PVD, as well as prolonged hospitalization from 11/8-12/6/22 for ARDS related to [place] requiring mechanical ventilation and another from 12/20 to 04/11/2021 for CHF, who comes in for evaluation of shortness of breath.  Per chart review patient called his PCPs office a couple days prior with a complaint of fluid retention, and feeling swollen and reported that at the time he was not using his torsemide twice daily as recommended.  He also declined a recommendation to go into the office for IV Lasix as he said  IV Lasix does not work for him.  The office advised that he go to the emergency room if symptoms worsen.  Patient states he started having shortness of breath and tried using his nebulizer but it was not working.  On the day of arrival he called EMS due to worsening symptoms.  On arrival of EMS his O2 sat was 84 to 85% on his home O2 t He denies chest pain, cough or fever.  Denies abdominal pain, diarrhea, nausea or vomiting. ED course: On arrival in the ED, tachypneic to 28 with O2 sat 90 to 91% on 4 L. Blood work: Troponin 8, BNP 47, bicarb 41.  Otherwise mostly unremarkable EKG, personally reviewed and interpreted: NSR at 91 with nonspecific ST-T wave changes Imaging: Cardiomegaly Central venous congestion No significant change from comparison exam on 12/23 Patient was treated in the ED with IV Lasix as well as duo nebs and Solu-Medrol.  Hospitalist consulted for admission.    Assessment and Plan: Acute on chronic respiratory failure with hypoxia, Multifactorial related to diastolic CHF exacerbation and COPD exacerbation, continue supplemental oxygen to keep sats above 92 Acute on chronic diastolic congestive heart failure, Patient is on torsemide at home, patient missed 1 dose on Monday, stated that he was drinking water about 3 L and more for that day. Held torsemide during hospital stay, S/p Lasix 60 mg given in the ED, s/p Lasix 40 mg IV daily, Continue carvedilol, Discontinued losartan due to potassium 5.1, risk of hyperkalemia and renal failure. Echo 02/2021 with EF  60 to 65% and G3 DD 1/20 started IV Lasix infusion 3 mg/h,  s/p albumin 12.5 g x 2 doses, Creatinine 1.5 --1.42--1.37--1.31 still elevated but gradually improving.  Patient's shortness of breath significantly improved and he wanted to be discharged home.  Resumed home dose torsemide on discharge, advised for fluid striction 1.5 to 2/day and follow with cardiology to repeat echocardiogram as an outpatient.  Repeat BMP after 1 week to  check renal functions. Chest pain, troponin negative, No significant EKG changes, Continue aspirin 80 mg daily, nitroglycerin sublingual as needed. Patient had no any prior cardiac work-up. NM Lexiscan done, which is negative for any ischemic findings.  COPD with acute exacerbation, continue inhalers as needed.  S/p Methylprednisolone to prednisone.  HTN (hypertension), Continue carvedilol, Discontinued losartan due to high potassium level and AKI Morbid obesity with BMI of 00.9-38.1, adult, Complicating factor to overall prognosis and care.  Calorie restricted diet and daily exercise advised to lose body weight Obstructive sleep apnea, Continue nightly CPAP Vitamin D deficiency, started vitamin D 5000 units p.o. weekly.  Follow with PCP to repeat vitamin D level after 3 months.  Body mass index is 51.34 kg/m.  Nutrition Problem: Increased nutrient needs Etiology: chronic illness (congestive heart failure) Nutrition Interventions: Interventions: Liberalize Diet, MVI, Other (Comment) (Double protein portions with meals)   Patient was seen by physical therapy, who recommended Home health, which was arranged. On the day of the discharge the patient's vitals were stable, and no other acute medical condition were reported by patient. the patient was felt safe to be discharge at Home with Home health.  Consultants: None Procedures: None  Discharge Exam: General: Appear in no distress, no Rash; Oral Mucosa Clear, moist. Cardiovascular: S1 and S2 Present, no Murmur, Respiratory: normal respiratory effort, Bilateral Air entry present and mild Crackles, no wheezes Abdomen: Bowel Sound present, Soft and no tenderness, no hernia Extremities: mild Pedal edema, no calf tenderness, dry skin, advised to lose moisturizer lotion. Neurology: alert and oriented to time, place, and person affect appropriate.  Filed Weights   05/06/21 0500 05/07/21 0500 05/08/21 0406  Weight: (!) 181.6 kg (!) 183.3 kg (!)  186.3 kg   Vitals:   05/08/21 0812 05/08/21 1151  BP: 125/82 (!) 108/94  Pulse: 85 75  Resp: 19 18  Temp: 98.1 F (36.7 C) 98.4 F (36.9 C)  SpO2: 95% 95%    DISCHARGE MEDICATION: Allergies as of 05/08/2021   No Known Allergies      Medication List     STOP taking these medications    losartan 25 MG tablet Commonly known as: COZAAR       TAKE these medications    acetaminophen 325 MG tablet Commonly known as: TYLENOL Take 2 tablets (650 mg total) by mouth every 6 (six) hours as needed for mild pain (or Fever >/= 101).   aspirin EC 81 MG tablet Take 81 mg by mouth daily. Swallow whole.   budesonide 0.25 MG/2ML nebulizer solution Commonly known as: PULMICORT Take 2 mLs (0.25 mg total) by nebulization 2 (two) times daily.   carvedilol 6.25 MG tablet Commonly known as: COREG Take 1 tablet (6.25 mg total) by mouth daily. Skip dose if systolic BP less than 829 mmHg and/or heart rate less than 65 What changed: additional instructions   ferrous sulfate 325 (65 FE) MG tablet Take 1 tablet (325 mg total) by mouth 2 (two) times daily with a meal.   fluticasone 50 MCG/ACT nasal spray Commonly known as: FLONASE  Place 2 sprays into both nostrils daily.   ipratropium-albuterol 0.5-2.5 (3) MG/3ML Soln Commonly known as: DUONEB Take 3 mLs by nebulization every 6 (six) hours.   loratadine 10 MG tablet Commonly known as: CLARITIN Take 1 tablet (10 mg total) by mouth daily.   pantoprazole 40 MG tablet Commonly known as: PROTONIX Take 1 tablet (40 mg total) by mouth daily.   potassium chloride 10 MEQ tablet Commonly known as: KLOR-CON M Take 1 tablet (10 mEq total) by mouth daily.   simvastatin 10 MG tablet Commonly known as: ZOCOR Take 1 tablet (10 mg total) by mouth daily at 6 PM.   sodium chloride 0.65 % Soln nasal spray Commonly known as: OCEAN Place 1 spray into both nostrils as needed for congestion.   torsemide 20 MG tablet Commonly known as:  DEMADEX Take 40 mg by mouth 2 (two) times daily.   Vitamin D (Ergocalciferol) 1.25 MG (50000 UNIT) Caps capsule Commonly known as: DRISDOL Take 1 capsule (50,000 Units total) by mouth every 7 (seven) days. Start taking on: May 13, 2021       No Known Allergies Discharge Instructions     Call MD for:  difficulty breathing, headache or visual disturbances   Complete by: As directed    Call MD for:  extreme fatigue   Complete by: As directed    Call MD for:  persistant dizziness or light-headedness   Complete by: As directed    Call MD for:  persistant nausea and vomiting   Complete by: As directed    Call MD for:  severe uncontrolled pain   Complete by: As directed    Call MD for:  temperature >100.4   Complete by: As directed    Diet - low sodium heart healthy   Complete by: As directed    Fluid restriction 1.5 L/day   Discharge instructions   Complete by: As directed    Follow-up with PCP in 1 week, continue monitor BP at home and follow with the patient.  Medication accordingly. Repeat BMP after 1 week to check renal functions.  Discontinued losartan secondary to AKI and hyperkalemia Follow with nephrology in 1 to 2 weeks Follow-up with cardiology in 1 to 2 weeks   Increase activity slowly   Complete by: As directed        The results of significant diagnostics from this hospitalization (including imaging, microbiology, ancillary and laboratory) are listed below for reference.    Significant Diagnostic Studies: NM Myocar Single W/Spect W/Wall Motion And EF  Result Date: 05/06/2021 Pharmacological myocardial perfusion imaging study with no significant  ischemia Normal wall motion, EF estimated at 39% (likely depressed from GI uptake artifact) Fixed Apical thinning noted, likely from attenuation artifact No EKG changes concerning for ischemia at peak stress or in recovery. Low risk scan Signed, Esmond Plants, MD, Ph.D [facility]   DG Chest Portable 1 View  Result  Date: 05/03/2021 CLINICAL DATA:  Short of breath EXAM: PORTABLE CHEST 1 VIEW COMPARISON:  04/07/2021 FINDINGS: Lordotic view. Stable enlarged cardiac silhouette. Low lung volumes. Central venous congestion. No focal consolidation. No pneumothorax. IMPRESSION: Cardiomegaly, low lung volumes, and central venous congestion. No significant change from comparison exam. Electronically Signed   By: Suzy Bouchard M.D.   On: 05/03/2021 19:40    Microbiology: Recent Results (from the past 240 hour(s))  Resp Panel by RT-PCR (Flu A&B, Covid) Nasopharyngeal Swab     Status: None   Collection Time: 05/03/21  7:14 PM  Specimen: Nasopharyngeal Swab; Nasopharyngeal(NP) swabs in vial transport medium  Result Value Ref Range Status   SARS Coronavirus 2 by RT PCR NEGATIVE NEGATIVE Final    Comment: (NOTE) SARS-CoV-2 target nucleic acids are NOT DETECTED.  The SARS-CoV-2 RNA is generally detectable in upper respiratory specimens during the acute phase of infection. The lowest concentration of SARS-CoV-2 viral copies this assay can detect is 138 copies/mL. A negative result does not preclude SARS-Cov-2 infection and should not be used as the sole basis for treatment or other patient management decisions. A negative result may occur with  improper specimen collection/handling, submission of specimen other than nasopharyngeal swab, presence of viral mutation(s) within the areas targeted by this assay, and inadequate number of viral copies(<138 copies/mL). A negative result must be combined with clinical observations, patient history, and epidemiological information. The expected result is Negative.  Fact Sheet for Patients:  EntrepreneurPulse.com.au  Fact Sheet for Healthcare Providers:  IncredibleEmployment.be  This test is no t yet approved or cleared by the [place] FDA and  has been authorized for detection and/or diagnosis of SARS-CoV-2 by FDA under an  Emergency Use Authorization (EUA). This EUA will remain  in effect (meaning this test can be used) for the duration of the COVID-19 declaration under Section 564(b)(1) of the Act, 21 U.S.C.section 360bbb-3(b)(1), unless the authorization is terminated  or revoked sooner.       Influenza A by PCR NEGATIVE NEGATIVE Final   Influenza B by PCR NEGATIVE NEGATIVE Final    Comment: (NOTE) The Xpert Xpress SARS-CoV-2/FLU/RSV plus assay is intended as an aid in the diagnosis of influenza from Nasopharyngeal swab specimens and should not be used as a sole basis for treatment. Nasal washings and aspirates are unacceptable for Xpert Xpress SARS-CoV-2/FLU/RSV testing.  Fact Sheet for Patients: EntrepreneurPulse.com.au  Fact Sheet for Healthcare Providers: IncredibleEmployment.be  This test is not yet approved or cleared by the [place] FDA and has been authorized for detection and/or diagnosis of SARS-CoV-2 by FDA under an Emergency Use Authorization (EUA). This EUA will remain in effect (meaning this test can be used) for the duration of the COVID-19 declaration under Section 564(b)(1) of the Act, 21 U.S.C. section 360bbb-3(b)(1), unless the authorization is terminated or revoked.  Performed at [facility], [place], [place] [address]   Respiratory (~20 pathogens) panel by PCR     Status: None   Collection Time: 05/03/21  7:14 PM   Specimen: Nasopharyngeal Swab; Respiratory  Result Value Ref Range Status   Adenovirus NOT DETECTED NOT DETECTED Final   Coronavirus 229E NOT DETECTED NOT DETECTED Final    Comment: (NOTE) The Coronavirus on the Respiratory Panel, DOES NOT test for the novel  Coronavirus (2019 nCoV)    Coronavirus HKU1 NOT DETECTED NOT DETECTED Final   Coronavirus NL63 NOT DETECTED NOT DETECTED Final   Coronavirus OC43 NOT DETECTED NOT DETECTED Final   Metapneumovirus NOT DETECTED NOT DETECTED Final    Rhinovirus / Enterovirus NOT DETECTED NOT DETECTED Final   Influenza A NOT DETECTED NOT DETECTED Final   Influenza B NOT DETECTED NOT DETECTED Final   Parainfluenza Virus 1 NOT DETECTED NOT DETECTED Final   Parainfluenza Virus 2 NOT DETECTED NOT DETECTED Final   Parainfluenza Virus 3 NOT DETECTED NOT DETECTED Final   Parainfluenza Virus 4 NOT DETECTED NOT DETECTED Final   Respiratory Syncytial Virus NOT DETECTED NOT DETECTED Final   Bordetella pertussis NOT DETECTED NOT DETECTED Final   Bordetella Parapertussis NOT DETECTED  NOT DETECTED Final   Chlamydophila pneumoniae NOT DETECTED NOT DETECTED Final   Mycoplasma pneumoniae NOT DETECTED NOT DETECTED Final    Comment: Performed at [facility], [place] [address], [place], [place] [address]     Labs: CBC: Recent Labs  Lab 05/04/21 0630 05/05/21 8099 05/06/21 0952 05/07/21 0747 05/08/21 0605  WBC 7.3 11.3* 7.9 8.5 9.0  HGB 14.4 12.6* 12.4* 12.0* 12.3*  HCT 53.3* 45.7 46.1 45.0 45.1  MCV 85.8 83.7 86.0 86.2 86.4  PLT 132* 146* 132* 134* 833*   Basic Metabolic Panel: Recent Labs  Lab 05/04/21 0630 05/05/21 0638 05/06/21 0658 05/07/21 0747 05/08/21 0605  NA 139 142 139 138 138  K 5.1 3.7 4.6 3.9 3.9  CL 96* 97* 94* 95* 96*  CO2 33* 37* 32 36* 36*  GLUCOSE 140* 99 82 88 97  BUN 19 31* 34* 39* 36*  CREATININE 1.28* 1.50* 1.42* 1.37* 1.31*  CALCIUM 8.5* 8.4* 8.1* 8.0* 8.1*  MG 2.0 1.9  --   --   --   PHOS 4.8* 4.5  --   --   --    Liver Function Tests: Recent Labs  Lab 05/03/21 1911  AST 13*  ALT 9  ALKPHOS 95  BILITOT 0.3  PROT 7.9  ALBUMIN 3.2*   No results for input(s): LIPASE, AMYLASE in the last 168 hours. No results for input(s): AMMONIA in the last 168 hours. Cardiac Enzymes: No results for input(s): CKTOTAL, CKMB, CKMBINDEX, TROPONINI in the last 168 hours. BNP (last 3 results) Recent Labs    04/04/21 1725 04/07/21 0339 05/03/21 1911  BNP 239.7* 31.7 47.0   CBG: No results for input(s):  GLUCAP in the last 168 hours.  Time spent: 35 minutes  Signed:  Val Riles  [facility]  05/08/2021 12:11 PM

## 2021-05-08 NOTE — TOC Transition Note (Signed)
Transition of Care [facility]) - CM/SW Discharge Note   Patient Details  Name: Gary [name] MRN: [id number] Date of Birth: [birth year]  Transition of Care [facility]) CM/SW Contact:  Alberteen Sam, LCSW Phone Number: 05/08/2021, 2:05 PM   Clinical Narrative:      Patient to discharge home today via private vehicle by spouse.  Oxygen tank for transport previously delivered to bedside 1/22, Zach with [employer] informed of patient's discharge today.   Corene Cornea with [facility] notified of DC home today.CSW has requested [place] orders for PT and RN from [place].   No additional TOC needs identified.  Final next level of care: [place] Barriers to Discharge: No Barriers Identified   Patient Goals and CMS Choice Patient states their goals for this hospitalization and ongoing recovery are:: to go home CMS Medicare.gov Compare Post Acute Care list provided to:: Patient Choice offered to / list presented to : Patient  Discharge Placement                  Name of family member notified: spouse Patient and family notified of of transfer: 05/08/21  Discharge Plan and Services   Discharge Planning Services: CM Consult            DME Arranged: Oxygen DME Agency: [employer] Date DME Agency Contacted: 05/07/21   Representative spoke with at DME Agency: [place]: PT, RN [place] Agency: [place] ([place]) Date [place]: 05/08/21 Time [place]: [place] Representative spoke with at [place]: [place] ([place]) Interventions     Readmission Risk Interventions Readmission Risk Prevention Plan 05/04/2021 04/07/2021 03/12/2021  Transportation Screening Complete Complete Complete  PCP or Specialist Appt within 3-5 Days - Complete Complete  Not Complete comments - - -  HRI or [place] - - Complete  Social Work Consult for [place] Planning/Counseling - - Complete  Palliative Care Screening - Not Applicable  Not Applicable  Medication Review Press photographer) Complete Complete Complete  PCP or Specialist appointment within 3-5 days of discharge Complete - -  [place] or Home Care Consult Complete - -  SW Recovery Care/Counseling Consult Complete - -  Palliative Care Screening Not Applicable - -  [place] Not Applicable - -  Some recent data might be hidden

## 2021-05-08 NOTE — Progress Notes (Signed)
Patient alert and oriented, vss, no complaints of pain. Escorted out of hospital by nursing staff.  D/c telemetry and PIV.

## 2021-05-11 ENCOUNTER — Other Ambulatory Visit: Payer: Self-pay

## 2021-05-11 ENCOUNTER — Emergency Department: Payer: Medicaid Other

## 2021-05-11 ENCOUNTER — Emergency Department
Admission: EM | Admit: 2021-05-11 | Discharge: 2021-05-11 | Disposition: A | Discharge status: Home / Self Care | Payer: Medicaid Other | Attending: Emergency Medicine | Admitting: Emergency Medicine

## 2021-05-11 DIAGNOSIS — I5030 Unspecified diastolic (congestive) heart failure: Secondary | ICD-10-CM | POA: Diagnosis not present

## 2021-05-11 DIAGNOSIS — I110 Hypertensive heart disease with heart failure: Secondary | ICD-10-CM | POA: Diagnosis not present

## 2021-05-11 DIAGNOSIS — R609 Edema, unspecified: Secondary | ICD-10-CM

## 2021-05-11 DIAGNOSIS — R600 Localized edema: Secondary | ICD-10-CM | POA: Insufficient documentation

## 2021-05-11 DIAGNOSIS — R0602 Shortness of breath: Secondary | ICD-10-CM

## 2021-05-11 DIAGNOSIS — J9611 Chronic respiratory failure with hypoxia: Secondary | ICD-10-CM | POA: Insufficient documentation

## 2021-05-11 DIAGNOSIS — J449 Chronic obstructive pulmonary disease, unspecified: Secondary | ICD-10-CM | POA: Diagnosis not present

## 2021-05-11 LAB — CBC
HCT: 48.5 % (ref 39.0–52.0)
Hemoglobin: 12.9 g/dL — ABNORMAL LOW (ref 13.0–17.0)
MCH: 23.3 pg — ABNORMAL LOW (ref 26.0–34.0)
MCHC: 26.6 g/dL — ABNORMAL LOW (ref 30.0–36.0)
MCV: 87.7 fL (ref 80.0–100.0)
Platelets: 150 10*3/uL (ref 150–400)
RBC: 5.53 MIL/uL (ref 4.22–5.81)
RDW: 18.1 % — ABNORMAL HIGH (ref 11.5–15.5)
WBC: 10.1 10*3/uL (ref 4.0–10.5)
nRBC: 0 % (ref 0.0–0.2)

## 2021-05-11 LAB — BASIC METABOLIC PANEL
Anion gap: 5 (ref 5–15)
BUN: 31 mg/dL — ABNORMAL HIGH (ref 8–23)
CO2: 37 mmol/L — ABNORMAL HIGH (ref 22–32)
Calcium: 8 mg/dL — ABNORMAL LOW (ref 8.9–10.3)
Chloride: 95 mmol/L — ABNORMAL LOW (ref 98–111)
Creatinine, Ser: 1.38 mg/dL — ABNORMAL HIGH (ref 0.61–1.24)
GFR, Estimated: 57 mL/min — ABNORMAL LOW (ref >60)
Glucose, Bld: 134 mg/dL — ABNORMAL HIGH (ref 70–99)
Potassium: 3.6 mmol/L (ref 3.5–5.1)
Sodium: 137 mmol/L (ref 135–145)

## 2021-05-11 LAB — BRAIN NATRIURETIC PEPTIDE: B Natriuretic Peptide: 21.2 pg/mL (ref 0.0–100.0)

## 2021-05-11 LAB — TROPONIN I (HIGH SENSITIVITY): Troponin I (High Sensitivity): 4 ng/L (ref <18)

## 2021-05-11 NOTE — ED Triage Notes (Signed)
Pt reports here for fluid build up in legs and ankles that started today, "a little" shob.  "Alittle chest pain", that has gone away.  States wears 2 L [place] chronic.   Pt speaking loudly on phone, full sentences.
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
## 2021-05-11 NOTE — ED Notes (Signed)
Pt states he does not ambulate at home.  Pt explains that he has a hospital bed, and unless his Rehab PT is there, he does not ambulate.  MD notified.

## 2021-05-11 NOTE — ED Triage Notes (Signed)
Pt comes into the ED via EMS from home with c/o "feels like fluids is filling up in my ankles", SOB in the past hour that has resolved, CHF/COPD, d/c 1/23 .  96% on 3L [place] home O2 135/97 HR82 RR20

## 2021-05-12 ENCOUNTER — Telehealth: Payer: Self-pay

## 2021-05-12 NOTE — Telephone Encounter (Signed)
° °  Post hospital discharge phone call   Spoke directly with the patient, [place] he states that he had been seen in the emergency room yesterday for what he thought was swelling in his legs.  He ruled out for DVT went home and took an extra dose of torsemide and states last evening he felt that he put out 2 L.  He states that the swelling is much improved today.  Discussed his diet he states that he has been eating crackers, and he realizes that that might be the problem. Also discussed being compliant with 2 liters fluid intake,   Went over his medication list and he has not picked up his Coreg yet from the pharmacy but he did stop taking the losartan.  He is taking the torsemide twice a day.Discussed that his Coreg is supposed to be taken once daily unless his blood pressure is less than 130 or heart rate is less than 65.  He has not weighed himself since discharge.  He states that he just took the scale out of the box today.  Stressed the importance of daily weights in the a.m. after urinating and to document.  Asked that he bring those to his heart failure clinic appointment on Tuesday, January 31 at 1:00.  Also stressed that he bring his medication bottles with him.  He does have an appointment with his PCP on Monday and wants to discuss that he is not received his nebulizer from the company.  I do not see that he is made his follow-up with cardiology which is due in 1 to 2 weeks and was also  was to be seen by nephrology in 1 to 2 weeks after discharge.  Pricilla Riffle RN [facility]

## 2021-05-16 ENCOUNTER — Ambulatory Visit: Payer: Medicaid Other | Admitting: Family

## 2021-05-16 ENCOUNTER — Telehealth: Payer: Self-pay | Admitting: Family

## 2021-05-16 NOTE — Telephone Encounter (Signed)
Patient did not show for his Heart Failure Clinic appointment on 05/16/21 even after confirming appointment. Will attempt to reschedule.
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
## 2021-05-25 ENCOUNTER — Ambulatory Visit: Payer: Medicaid Other | Admitting: Family

## 2021-05-25 ENCOUNTER — Encounter: Payer: Self-pay | Admitting: Pharmacist
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
## 2021-06-07 ENCOUNTER — Ambulatory Visit: Payer: Medicaid Other | Attending: Family | Admitting: Family

## 2021-06-07 ENCOUNTER — Other Ambulatory Visit: Payer: Self-pay

## 2021-06-07 ENCOUNTER — Encounter: Payer: Self-pay | Admitting: Family

## 2021-06-07 VITALS — BP 123/85 | HR 91 | Resp 16 | Ht 75.0 in | Wt 393.1 lb

## 2021-06-07 DIAGNOSIS — Z720 Tobacco use: Secondary | ICD-10-CM | POA: Diagnosis not present

## 2021-06-07 DIAGNOSIS — Z8701 Personal history of pneumonia (recurrent): Secondary | ICD-10-CM | POA: Diagnosis not present

## 2021-06-07 DIAGNOSIS — J449 Chronic obstructive pulmonary disease, unspecified: Secondary | ICD-10-CM | POA: Insufficient documentation

## 2021-06-07 DIAGNOSIS — Z9989 Dependence on other enabling machines and devices: Secondary | ICD-10-CM | POA: Insufficient documentation

## 2021-06-07 DIAGNOSIS — G4733 Obstructive sleep apnea (adult) (pediatric): Secondary | ICD-10-CM | POA: Diagnosis not present

## 2021-06-07 DIAGNOSIS — Z8616 Personal history of COVID-19: Secondary | ICD-10-CM | POA: Insufficient documentation

## 2021-06-07 DIAGNOSIS — I110 Hypertensive heart disease with heart failure: Secondary | ICD-10-CM | POA: Diagnosis present

## 2021-06-07 DIAGNOSIS — I10 Essential (primary) hypertension: Secondary | ICD-10-CM | POA: Diagnosis not present

## 2021-06-07 DIAGNOSIS — Z9981 Dependence on supplemental oxygen: Secondary | ICD-10-CM | POA: Insufficient documentation

## 2021-06-07 DIAGNOSIS — M2559 Pain in other specified joint: Secondary | ICD-10-CM | POA: Diagnosis not present

## 2021-06-07 DIAGNOSIS — I5032 Chronic diastolic (congestive) heart failure: Secondary | ICD-10-CM | POA: Insufficient documentation

## 2021-06-07 DIAGNOSIS — Z8719 Personal history of other diseases of the digestive system: Secondary | ICD-10-CM | POA: Insufficient documentation

## 2021-06-07 DIAGNOSIS — Z87891 Personal history of nicotine dependence: Secondary | ICD-10-CM | POA: Diagnosis not present

## 2021-06-07 DIAGNOSIS — Z7951 Long term (current) use of inhaled steroids: Secondary | ICD-10-CM | POA: Insufficient documentation

## 2021-06-07 MED ORDER — DAPAGLIFLOZIN PROPANEDIOL 10 MG PO TABS: 10.0000 mg | ORAL_TABLET | Freq: Every day | ORAL | 5 refills | Status: DC

## 2021-06-07 NOTE — Patient Instructions (Addendum)
Continue weighing daily and call for an overnight weight gain of 3 pounds or more or a weekly weight gain of more than 5 pounds.   Begin farxiga as 1 tablet every morning.
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
## 2021-06-20 ENCOUNTER — Other Ambulatory Visit (HOSPITAL_COMMUNITY): Payer: Self-pay

## 2021-06-20 NOTE — Progress Notes (Signed)
Had appt scheduled today but he contacted me and advised today was a bad day, we rescheduled for tomorrow.  ? ?Jden Want ?[place] EMT-Paramedic ?[phone] ?

## 2021-06-22 ENCOUNTER — Other Ambulatory Visit (HOSPITAL_COMMUNITY): Payer: Self-pay

## 2021-06-22 NOTE — Progress Notes (Signed)
Went to his home, package on side step, appears no one is home.  Knocked no answer.  Will continue to try to visit.  ? ?Windi Toro ?[place] EMT-Paramedic ?[phone] ?
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
## 2021-07-05 ENCOUNTER — Other Ambulatory Visit: Payer: Self-pay

## 2021-07-05 ENCOUNTER — Encounter: Payer: Self-pay | Admitting: Family

## 2021-07-05 ENCOUNTER — Other Ambulatory Visit
Admission: RE | Admit: 2021-07-05 | Discharge: 2021-07-05 | Disposition: A | Discharge status: Home / Self Care | Payer: Medicaid Other | Source: Ambulatory Visit | Attending: Family | Admitting: Family

## 2021-07-05 ENCOUNTER — Ambulatory Visit (HOSPITAL_BASED_OUTPATIENT_CLINIC_OR_DEPARTMENT_OTHER): Payer: Medicaid Other | Admitting: Family

## 2021-07-05 ENCOUNTER — Telehealth: Payer: Self-pay | Admitting: Family

## 2021-07-05 VITALS — BP 112/77 | HR 96 | Resp 18 | Ht 76.0 in | Wt 397.0 lb

## 2021-07-05 DIAGNOSIS — I5032 Chronic diastolic (congestive) heart failure: Secondary | ICD-10-CM | POA: Insufficient documentation

## 2021-07-05 DIAGNOSIS — Z09 Encounter for follow-up examination after completed treatment for conditions other than malignant neoplasm: Secondary | ICD-10-CM | POA: Insufficient documentation

## 2021-07-05 DIAGNOSIS — Z720 Tobacco use: Secondary | ICD-10-CM | POA: Diagnosis not present

## 2021-07-05 DIAGNOSIS — I10 Essential (primary) hypertension: Secondary | ICD-10-CM

## 2021-07-05 DIAGNOSIS — R0602 Shortness of breath: Secondary | ICD-10-CM | POA: Insufficient documentation

## 2021-07-05 DIAGNOSIS — G8929 Other chronic pain: Secondary | ICD-10-CM | POA: Insufficient documentation

## 2021-07-05 DIAGNOSIS — R42 Dizziness and giddiness: Secondary | ICD-10-CM | POA: Insufficient documentation

## 2021-07-05 DIAGNOSIS — J449 Chronic obstructive pulmonary disease, unspecified: Secondary | ICD-10-CM | POA: Insufficient documentation

## 2021-07-05 DIAGNOSIS — Z87891 Personal history of nicotine dependence: Secondary | ICD-10-CM | POA: Insufficient documentation

## 2021-07-05 DIAGNOSIS — G4733 Obstructive sleep apnea (adult) (pediatric): Secondary | ICD-10-CM | POA: Insufficient documentation

## 2021-07-05 DIAGNOSIS — Z8616 Personal history of COVID-19: Secondary | ICD-10-CM | POA: Insufficient documentation

## 2021-07-05 DIAGNOSIS — Z7984 Long term (current) use of oral hypoglycemic drugs: Secondary | ICD-10-CM | POA: Insufficient documentation

## 2021-07-05 DIAGNOSIS — Z9981 Dependence on supplemental oxygen: Secondary | ICD-10-CM | POA: Insufficient documentation

## 2021-07-05 DIAGNOSIS — I110 Hypertensive heart disease with heart failure: Secondary | ICD-10-CM | POA: Insufficient documentation

## 2021-07-05 DIAGNOSIS — Z9989 Dependence on other enabling machines and devices: Secondary | ICD-10-CM | POA: Insufficient documentation

## 2021-07-05 DIAGNOSIS — R5383 Other fatigue: Secondary | ICD-10-CM | POA: Insufficient documentation

## 2021-07-05 LAB — BASIC METABOLIC PANEL
Anion gap: 10 (ref 5–15)
BUN: 39 mg/dL — ABNORMAL HIGH (ref 8–23)
CO2: 36 mmol/L — ABNORMAL HIGH (ref 22–32)
Calcium: 8.8 mg/dL — ABNORMAL LOW (ref 8.9–10.3)
Chloride: 94 mmol/L — ABNORMAL LOW (ref 98–111)
Creatinine, Ser: 2.21 mg/dL — ABNORMAL HIGH (ref 0.61–1.24)
GFR, Estimated: 32 mL/min — ABNORMAL LOW (ref >60)
Glucose, Bld: 127 mg/dL — ABNORMAL HIGH (ref 70–99)
Potassium: 3.2 mmol/L — ABNORMAL LOW (ref 3.5–5.1)
Sodium: 140 mmol/L (ref 135–145)

## 2021-07-05 MED ORDER — POTASSIUM CHLORIDE CRYS ER 10 MEQ PO TBCR: 10.0000 meq | EXTENDED_RELEASE_TABLET | Freq: Every day | ORAL | 5 refills | Status: DC

## 2021-07-05 NOTE — Patient Instructions (Signed)
Continue weighing daily and call for an overnight weight gain of 3 pounds or more or a weekly weight gain of more than 5 pounds.   If you have voicemail, please make sure your mailbox is cleaned out so that we may leave a message and please make sure to listen to any voicemails.     

## 2021-07-05 NOTE — Telephone Encounter (Signed)
Spoke with patient regarding lab results obtained earlier today. Potassium is low at 3.2 but he said earlier today that he had been out of his potassium tablets. Reminded him that a RX was sent to his pharmacy today so that he can get it started.  ? ?Kidney function has declined some so will decrease his torsemide to '40mg'$  AM/ '20mg'$  QPM (was taking '40mg'$  BID). Patient verbalized understanding.  ? ? ?

## 2021-07-12 ENCOUNTER — Other Ambulatory Visit (HOSPITAL_COMMUNITY): Payer: Self-pay

## 2021-07-12 NOTE — Progress Notes (Signed)
Showed up at his home, a lady came out and said she did not know someone was coming.  He is still in the bed.  She went and asked him and came back and asked if we could do it tomorrow.  I said we could, will attempt it again tomorrow.  She said it would take him to much time to get dressed and ready today.  ? ?Jillaine Waren ?[place] EMT-Paramedic ?[phone] ?

## 2021-07-13 ENCOUNTER — Other Ambulatory Visit (HOSPITAL_COMMUNITY): Payer: Self-pay

## 2021-07-13 NOTE — Progress Notes (Signed)
Today was abe to have a home visit with Gary [name].  Explained the program to him and he appears to understand.  We went over his medications and he states has everything.  He states picking up potassium today.  Advised him if any trouble affording or getting his prescriptions to let me know, I will help him to not run out of his meds.  He appeared to understand.  He told me he weighs daily but did not weigh today and could not tell me a weight.  He states urinated a lot yesterday and fluid is down.   He does not want a visit weekly, maybe monthly and phone calls.  Explained to him about weighing everyday and to call with weight gain or problems.  He needs some handrails on his steps and a ramp on front steps.  Will reach out to some programs to see if they will do it for him.   He did not show his medications to me, asked but he said he had them on.  Will continue to try to meet with him and get to know him.  Will visit for heart failure, diet and medication management. ? ?Gary [name] ?[place] EMT-Paramedic ?[phone] ?

## 2021-07-20 ENCOUNTER — Inpatient Hospital Stay
Admission: EM | Admit: 2021-07-20 | Discharge: 2021-07-23 | DRG: 291 | Disposition: A | Discharge status: Home / Self Care | Payer: Medicaid Other | Attending: Internal Medicine | Admitting: Internal Medicine

## 2021-07-20 ENCOUNTER — Emergency Department: Payer: Medicaid Other

## 2021-07-20 ENCOUNTER — Other Ambulatory Visit: Payer: Self-pay

## 2021-07-20 DIAGNOSIS — J441 Chronic obstructive pulmonary disease with (acute) exacerbation: Secondary | ICD-10-CM | POA: Diagnosis present

## 2021-07-20 DIAGNOSIS — I130 Hypertensive heart and chronic kidney disease with heart failure and stage 1 through stage 4 chronic kidney disease, or unspecified chronic kidney disease: Principal | ICD-10-CM | POA: Diagnosis present

## 2021-07-20 DIAGNOSIS — N1831 Chronic kidney disease, stage 3a: Secondary | ICD-10-CM | POA: Diagnosis present

## 2021-07-20 DIAGNOSIS — I501 Left ventricular failure: Secondary | ICD-10-CM

## 2021-07-20 DIAGNOSIS — Z7951 Long term (current) use of inhaled steroids: Secondary | ICD-10-CM

## 2021-07-20 DIAGNOSIS — G9341 Metabolic encephalopathy: Secondary | ICD-10-CM | POA: Diagnosis present

## 2021-07-20 DIAGNOSIS — E662 Morbid (severe) obesity with alveolar hypoventilation: Secondary | ICD-10-CM | POA: Diagnosis present

## 2021-07-20 DIAGNOSIS — J9602 Acute respiratory failure with hypercapnia: Secondary | ICD-10-CM

## 2021-07-20 DIAGNOSIS — J9621 Acute and chronic respiratory failure with hypoxia: Secondary | ICD-10-CM | POA: Diagnosis present

## 2021-07-20 DIAGNOSIS — I5033 Acute on chronic diastolic (congestive) heart failure: Secondary | ICD-10-CM

## 2021-07-20 DIAGNOSIS — Z20822 Contact with and (suspected) exposure to covid-19: Secondary | ICD-10-CM | POA: Diagnosis present

## 2021-07-20 DIAGNOSIS — D696 Thrombocytopenia, unspecified: Secondary | ICD-10-CM | POA: Diagnosis present

## 2021-07-20 DIAGNOSIS — R7989 Other specified abnormal findings of blood chemistry: Secondary | ICD-10-CM | POA: Diagnosis present

## 2021-07-20 DIAGNOSIS — J449 Chronic obstructive pulmonary disease, unspecified: Secondary | ICD-10-CM

## 2021-07-20 DIAGNOSIS — E8729 Other acidosis: Secondary | ICD-10-CM | POA: Diagnosis present

## 2021-07-20 DIAGNOSIS — J9622 Acute and chronic respiratory failure with hypercapnia: Secondary | ICD-10-CM | POA: Diagnosis present

## 2021-07-20 DIAGNOSIS — Z8616 Personal history of COVID-19: Secondary | ICD-10-CM

## 2021-07-20 DIAGNOSIS — G4733 Obstructive sleep apnea (adult) (pediatric): Secondary | ICD-10-CM | POA: Diagnosis not present

## 2021-07-20 DIAGNOSIS — Z7982 Long term (current) use of aspirin: Secondary | ICD-10-CM

## 2021-07-20 DIAGNOSIS — Z6841 Body Mass Index (BMI) 40.0 and over, adult: Secondary | ICD-10-CM

## 2021-07-20 DIAGNOSIS — Z91199 Patient's noncompliance with other medical treatment and regimen due to unspecified reason: Secondary | ICD-10-CM | POA: Diagnosis not present

## 2021-07-20 DIAGNOSIS — Z79899 Other long term (current) drug therapy: Secondary | ICD-10-CM | POA: Diagnosis not present

## 2021-07-20 DIAGNOSIS — Z87891 Personal history of nicotine dependence: Secondary | ICD-10-CM

## 2021-07-20 DIAGNOSIS — G928 Other toxic encephalopathy: Secondary | ICD-10-CM

## 2021-07-20 DIAGNOSIS — I272 Pulmonary hypertension, unspecified: Secondary | ICD-10-CM | POA: Diagnosis present

## 2021-07-20 DIAGNOSIS — J438 Other emphysema: Secondary | ICD-10-CM | POA: Diagnosis not present

## 2021-07-20 LAB — CBC WITH DIFFERENTIAL/PLATELET
Abs Immature Granulocytes: 0.05 10*3/uL (ref 0.00–0.07)
Basophils Absolute: 0 10*3/uL (ref 0.0–0.1)
Basophils Relative: 0 %
Eosinophils Absolute: 0.2 10*3/uL (ref 0.0–0.5)
Eosinophils Relative: 2 %
HCT: 46.3 % (ref 39.0–52.0)
Hemoglobin: 12 g/dL — ABNORMAL LOW (ref 13.0–17.0)
Immature Granulocytes: 1 %
Lymphocytes Relative: 19 %
Lymphs Abs: 1.4 10*3/uL (ref 0.7–4.0)
MCH: 23.2 pg — ABNORMAL LOW (ref 26.0–34.0)
MCHC: 25.9 g/dL — ABNORMAL LOW (ref 30.0–36.0)
MCV: 89.6 fL (ref 80.0–100.0)
Monocytes Absolute: 0.7 10*3/uL (ref 0.1–1.0)
Monocytes Relative: 10 %
Neutro Abs: 5 10*3/uL (ref 1.7–7.7)
Neutrophils Relative %: 68 %
Platelets: 159 10*3/uL (ref 150–400)
RBC: 5.17 MIL/uL (ref 4.22–5.81)
RDW: 16.9 % — ABNORMAL HIGH (ref 11.5–15.5)
WBC: 7.3 10*3/uL (ref 4.0–10.5)
nRBC: 2.5 % — ABNORMAL HIGH (ref 0.0–0.2)

## 2021-07-20 LAB — COMPREHENSIVE METABOLIC PANEL
ALT: 10 U/L (ref 0–44)
AST: 13 U/L — ABNORMAL LOW (ref 15–41)
Albumin: 3.3 g/dL — ABNORMAL LOW (ref 3.5–5.0)
Alkaline Phosphatase: 119 U/L (ref 38–126)
Anion gap: 5 (ref 5–15)
BUN: 22 mg/dL (ref 8–23)
CO2: 36 mmol/L — ABNORMAL HIGH (ref 22–32)
Calcium: 8.2 mg/dL — ABNORMAL LOW (ref 8.9–10.3)
Chloride: 100 mmol/L (ref 98–111)
Creatinine, Ser: 1.6 mg/dL — ABNORMAL HIGH (ref 0.61–1.24)
GFR, Estimated: 48 mL/min — ABNORMAL LOW (ref >60)
Glucose, Bld: 121 mg/dL — ABNORMAL HIGH (ref 70–99)
Potassium: 3.9 mmol/L (ref 3.5–5.1)
Sodium: 141 mmol/L (ref 135–145)
Total Bilirubin: 0.4 mg/dL (ref 0.3–1.2)
Total Protein: 8 g/dL (ref 6.5–8.1)

## 2021-07-20 LAB — BLOOD GAS, VENOUS
Acid-Base Excess: 7.2 mmol/L — ABNORMAL HIGH (ref 0.0–2.0)
Acid-Base Excess: 12.2 mmol/L — ABNORMAL HIGH (ref 0.0–2.0)
Acid-Base Excess: 13.9 mmol/L — ABNORMAL HIGH (ref 0.0–2.0)
Bicarbonate: 38.8 mmol/L — ABNORMAL HIGH (ref 20.0–28.0)
Bicarbonate: 45.3 mmol/L — ABNORMAL HIGH (ref 20.0–28.0)
Bicarbonate: 45.8 mmol/L — ABNORMAL HIGH (ref 20.0–28.0)
Delivery systems: POSITIVE
FIO2: 0.3 %
O2 Saturation: 52.1 %
O2 Saturation: 56.8 %
O2 Saturation: 63.5 %
Patient temperature: 37
Patient temperature: 37
Patient temperature: 37
pCO2, Ven: 97 mmHg (ref 44–60)
pCO2, Ven: 116 mmHg (ref 44–60)
pCO2, Ven: 102 mmHg (ref 44–60)
pH, Ven: 7.21 — ABNORMAL LOW (ref 7.25–7.43)
pH, Ven: 7.2 — ABNORMAL LOW (ref 7.25–7.43)
pH, Ven: 7.26 (ref 7.25–7.43)
pO2, Ven: 33 mmHg (ref 32–45)
pO2, Ven: 37 mmHg (ref 32–45)
pO2, Ven: 40 mmHg (ref 32–45)

## 2021-07-20 LAB — MRSA NEXT GEN BY PCR, NASAL: MRSA by PCR Next Gen: NOT DETECTED

## 2021-07-20 LAB — URINALYSIS, COMPLETE (UACMP) WITH MICROSCOPIC
Bacteria, UA: NONE SEEN
Bilirubin Urine: NEGATIVE
Glucose, UA: 50 mg/dL — AB
Hgb urine dipstick: NEGATIVE
Ketones, ur: NEGATIVE mg/dL
Leukocytes,Ua: NEGATIVE
Nitrite: NEGATIVE
Protein, ur: NEGATIVE mg/dL
Specific Gravity, Urine: 1.026 (ref 1.005–1.030)
pH: 5 (ref 5.0–8.0)

## 2021-07-20 LAB — URINE DRUG SCREEN, QUALITATIVE (ARMC ONLY)
Amphetamines, Ur Screen: NOT DETECTED
Barbiturates, Ur Screen: NOT DETECTED
Benzodiazepine, Ur Scrn: NOT DETECTED
Cannabinoid 50 Ng, Ur ~~LOC~~: NOT DETECTED
Cocaine Metabolite,Ur ~~LOC~~: NOT DETECTED
MDMA (Ecstasy)Ur Screen: NOT DETECTED
Methadone Scn, Ur: NOT DETECTED
Opiate, Ur Screen: NOT DETECTED
Phencyclidine (PCP) Ur S: NOT DETECTED
Tricyclic, Ur Screen: NOT DETECTED

## 2021-07-20 LAB — PROTIME-INR
INR: 1 (ref 0.8–1.2)
Prothrombin Time: 13.2 seconds (ref 11.4–15.2)

## 2021-07-20 LAB — GLUCOSE, CAPILLARY: Glucose-Capillary: 73 mg/dL (ref 70–99)

## 2021-07-20 LAB — TROPONIN I (HIGH SENSITIVITY)
Troponin I (High Sensitivity): 8 ng/L (ref <18)
Troponin I (High Sensitivity): 8 ng/L (ref <18)

## 2021-07-20 LAB — PROCALCITONIN: Procalcitonin: <0.1 ng/mL

## 2021-07-20 LAB — RESP PANEL BY RT-PCR (FLU A&B, COVID) ARPGX2
Influenza A by PCR: NEGATIVE
Influenza B by PCR: NEGATIVE
SARS Coronavirus 2 by RT PCR: NEGATIVE

## 2021-07-20 LAB — MAGNESIUM: Magnesium: 2.3 mg/dL (ref 1.7–2.4)

## 2021-07-20 LAB — BRAIN NATRIURETIC PEPTIDE: B Natriuretic Peptide: 93.3 pg/mL (ref 0.0–100.0)

## 2021-07-20 LAB — D-DIMER, QUANTITATIVE: D-Dimer, Quant: 0.71 ug/mL-FEU — ABNORMAL HIGH (ref 0.00–0.50)

## 2021-07-20 MED ORDER — IPRATROPIUM-ALBUTEROL 0.5-2.5 (3) MG/3ML IN SOLN
9.0000 mL | Freq: Once | RESPIRATORY_TRACT | Status: AC
Start: 2021-07-20 — End: 2021-07-20
  Administered 2021-07-20: 3 mL via RESPIRATORY_TRACT
  Filled 2021-07-20: qty 3

## 2021-07-20 MED ORDER — IOHEXOL 350 MG/ML SOLN
80.0000 mL | Freq: Once | INTRAVENOUS | Status: AC | PRN
Start: 2021-07-20 — End: 2021-07-20
  Administered 2021-07-20: 80 mL via INTRAVENOUS

## 2021-07-20 MED ORDER — CHLORHEXIDINE GLUCONATE CLOTH 2 % EX PADS
6.0000 | MEDICATED_PAD | Freq: Every day | CUTANEOUS | Status: DC
  Administered 2021-07-20: 6 via TOPICAL

## 2021-07-20 MED ORDER — ENOXAPARIN SODIUM 100 MG/ML IJ SOSY
0.5000 mg/kg | PREFILLED_SYRINGE | INTRAMUSCULAR | Status: DC
  Administered 2021-07-20 – 2021-07-22 (×3): 90 mg via SUBCUTANEOUS
  Filled 2021-07-20 (×4): qty 0.9

## 2021-07-20 MED ORDER — FUROSEMIDE 10 MG/ML IJ SOLN
40.0000 mg | Freq: Once | INTRAMUSCULAR | Status: AC
Start: 2021-07-20 — End: 2021-07-20
  Administered 2021-07-20: 40 mg via INTRAVENOUS
  Filled 2021-07-20: qty 4

## 2021-07-20 MED ORDER — DOCUSATE SODIUM 100 MG PO CAPS: 100.0000 mg | ORAL_CAPSULE | Freq: Two times a day (BID) | ORAL | Status: DC | PRN

## 2021-07-20 MED ORDER — PANTOPRAZOLE SODIUM 40 MG IV SOLR
40.0000 mg | INTRAVENOUS | Status: DC
  Administered 2021-07-20: 40 mg via INTRAVENOUS
  Filled 2021-07-20: qty 10

## 2021-07-20 MED ORDER — BUDESONIDE 0.25 MG/2ML IN SUSP
0.2500 mg | Freq: Two times a day (BID) | RESPIRATORY_TRACT | Status: DC
  Administered 2021-07-20 – 2021-07-23 (×6): 0.25 mg via RESPIRATORY_TRACT
  Filled 2021-07-20 (×6): qty 2

## 2021-07-20 MED ORDER — POLYETHYLENE GLYCOL 3350 17 G PO PACK: 17.0000 g | PACK | Freq: Every day | ORAL | Status: DC | PRN

## 2021-07-20 MED ORDER — ENOXAPARIN SODIUM 40 MG/0.4ML IJ SOSY: 40.0000 mg | PREFILLED_SYRINGE | INTRAMUSCULAR | Status: DC

## 2021-07-20 MED ORDER — FUROSEMIDE 10 MG/ML IJ SOLN
40.0000 mg | Freq: Once | INTRAMUSCULAR | Status: AC
  Administered 2021-07-20: 40 mg via INTRAVENOUS
  Filled 2021-07-20: qty 4

## 2021-07-20 MED ORDER — ALBUTEROL SULFATE (2.5 MG/3ML) 0.083% IN NEBU: 2.5000 mg | INHALATION_SOLUTION | RESPIRATORY_TRACT | Status: DC | PRN
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
## 2021-07-20 NOTE — Progress Notes (Signed)
PHARMACY CONSULT NOTE ? ?Pharmacy Consult for Electrolyte Monitoring and Replacement  ? ?Recent Labs: ?Potassium (mmol/L)  ?Date Value  ?07/20/2021 3.9  ?04/15/2014 3.9  ? ?Magnesium (mg/dL)  ?Date Value  ?07/20/2021 2.3  ?08/21/2012 1.9  ? ?Calcium (mg/dL)  ?Date Value  ?07/20/2021 8.2 (L)  ? ?Calcium, Total (mg/dL)  ?Date Value  ?04/15/2014 8.3 (L)  ? ?Albumin (g/dL)  ?Date Value  ?07/20/2021 3.3 (L)  ?04/15/2014 2.9 (L)  ? ?Phosphorus (mg/dL)  ?Date Value  ?05/05/2021 4.5  ? ?Sodium (mmol/L)  ?Date Value  ?07/20/2021 141  ?03/15/2020 145 (H)  ?04/15/2014 140  ? ?Corrected Ca: 8.76 mg/dL ? ?Assessment: 66 y.o. male with a past medical history of obesity, COPD, CHF, previous GI bleeds, HTN and OSA chronically on 2 L nasal cannula and CPAP at night who presents via EMS from home for evaluation of worsening shortness of breath. PCCM asked to admit to stepdown for BiPAP.  High risk for intubation. Pharmacy is asked to follow and replace electrolytes while in the CCU ? ?Goal of Therapy:  Electrolytes WNL ? ?Plan:  ?No electrolyte replacement warranted at this time ?Recheck electrolytes in am ? ?Dallie Piles ,PharmD ?Clinical Pharmacist ?07/20/2021 4:36 PM ? ?

## 2021-07-20 NOTE — H&P (Signed)
? ?NAME:  Gary [name], MRN:  [id number], DOB:  May 16, 1955, LOS: 0 ?ADMISSION DATE:  07/20/2021, CONSULTATION DATE:  07/20/2021 ?REFERRING MD:  Dr. Tamala Julian, CHIEF COMPLAINT: Shortness of breath, weakness ? ?Brief Pt Description / Synopsis:  ?66 year old male admitted with acute on chronic hypoxic & hypercapnic respiratory failure with acute metabolic encephalopathy due to CO2 narcosis in the setting of OSA/OHS/noncompliance with CPAP, pulmonary hypertension, and suspected pulmonary edema requiring BiPAP.  High risk for intubation ? ?History of Present Illness:  ?Gary [name] is a 66 year old male with a past medical history as listed below who presented to [facility] ED on 07/20/2021 due to complaints of progressive shortness of breath with associated cough and generalized weakness. ? ?Patient is currently very lethargic, therefore history is obtained from patient's significant other at bedside along with chart review.  His significant other reports symptoms began on 4/3, and that he has only been intermittently wearing his oxygen.  She said he complained of a little bit of chest tightness yesterday, but none today.  Denies any knowledge of fevers, chills, abdominal pain, nausea, vomiting, diarrhea.  Patient denied any tobacco abuse, alcohol use, or illicit drug use.  Upon EMS arrival he was found to be hypoxic with O2 sat 68% on room air.  Was subsequently placed on nonrebreather mask with EMS. ? ?Of note he was recently hospitalized for acute COPD exacerbation and late March 2023.  He reported at that time his oxygen concentrator stopped working without him noticing with resultant hypoxic respiratory failure. ? ?ED Course: ?Initial vital signs: Temperature 98.2, respiratory rate 22, pulse 81, blood pressure 128/77, SPO2 92% on 4 L nasal cannula ?Significant Labs: Bicarbonate 36, glucose 121, BUN 22, creatinine 1.6, BNP 93.3, high-sensitivity troponin 8, WBC 7.3, procalcitonin less than 0.10, hemoglobin 12.0, hematocrit 46.3,  D-dimer 0.71 ?VBG: 7.21/PCO2 97/PO2 33/bicarb 38.8 ?Repeat VBG: pH 7.2/PCO2 116/PO2 37/bicarb 45.3 ?COVID-19 PCR is pending ?Imaging: ?Chest x-ray>>IMPRESSION: ?Cardiomegaly with increased bilateral lung markings. Findings are ?concerning for vascular congestion or mild pulmonary edema. ?CTA chest>>IMPRESSION: ?1. No evidence of central pulmonary embolus. Evaluation of the more ?distal pulmonary arteries is limited due to patient body habitus and ?streak artifact. ?2. Dilated pulmonary arteries, findings can be seen in the setting ?of pulmonary hypertension. ?3. Cardiomegaly and aortic Atherosclerosis  ?Medications given: DuoNeb x1 dose, 40 mg IV Lasix ? ?Given his hypercapnia on VBG, he was placed on BiPAP by the ED provider.  Initially hospitalist was asked to admit, however became more lethargic and follow-up VBG with worsening hypercapnia. Therefore PCCM is asked to admit given that he is high risk for intubation. ? ? ?Pertinent  Medical History  ?Obstructive sleep apnea (on CPAP) ?COPD (on 2 L supplemental O2 at baseline) ?HFpEF ?Hypertension ?GI bleed ?Morbid obesity ? ? ?Micro Data:  ?4/6: SARS-CoV-2 and influenza PCR>> ? ?Antimicrobials:  ?N/A ? ?Significant Hospital Events: ?Including procedures, antibiotic start and stop dates in addition to other pertinent events   ?4/6: PCCM asked to admit to stepdown for BiPAP.  High risk for intubation ? ?Interim History / Subjective:  ?-Patient presented early this morning with increased shortness of breath and cough ?-Noted to become more lethargic with worsening hypercapnia, and on BiPAP ?-High risk for intubation, PCCM asked to admit ? ?Objective   ?Blood pressure (!) 160/84, pulse 82, temperature 98.2 ?F (36.8 ?C), resp. rate 19, height '6\' 4"'$  (1.93 m), weight (!) 181.4 kg, SpO2 99 %. ?   ?   ?No intake or output data in the  24 hours ending 07/20/21 1627 ?Filed Weights  ? 07/20/21 0908  ?Weight: (!) 181.4 kg  ? ? ?Examination: ?General: Acute on chronically  ill-appearing male, sitting in bed, on BiPAP, in no acute distress ?HENT: Atraumatic, normocephalic, neck supple, difficult to assess JVD due to BiPAP and body habitus ?Lungs: Diminished breath sounds throughout, no wheezing or rales noted, BiPAP assisted, even ?Cardiovascular: Regular rate and rhythm with occasional PVC, S1-S2, no murmurs, rubs, gallops ?Abdomen: Obese, soft, nontender, distant bowel sounds ?Extremities: Normal bulk and tone, no deformities, 2+ pitting edema bilateral lower extremity ?Neuro: Lethargic, arouses to voice/gentle stimulation ?GU: Deferred ? ?Resolved Hospital Problem list   ? ? ?Assessment & Plan:  ? ?Acute on Chronic Hypoxic & Hypercapnic Respiratory Failure in the setting of OSA/OHS (? noncompliance with CPAP), Pulmonary Hypertension, and suspected Pulmonary edema ?COPD without acute exacerbation ?PMHx: COPD (on 2L supplemental O2 at baseline) ?-Supplemental O2 as needed to maintain O2 sats 88 to 92% ?-BiPAP, wean as tolerated ?-High risk for decompensation and intubation ?-Follow intermittent Chest X-ray & ABG as needed ?-Bronchodilators & Pulmicort nebs ?-Diuresis as BP and renal function permits ?-Pulmonary toilet as able ? ?Acute on Chronic HFpEF ?PMHx: Hypertension ?-Continuous cardiac monitoring ?-Maintain MAP >65 ?-HS Troponin negative x2 ?-BNP 99.3 on admission ?-Diuresis as blood pressure and renal function permits ~received 40 mg IV Lasix x1 dose in the ED, noted to take 40 mg torsemide at home, discussed with Dr. Tacy Learn, will give an additional 40 mg of IV Lasix ?-Echocardiogram 02/21/2021: LVEF 60 to 37%, grade 3 diastolic dysfunction, RV systolic function normal ?-Hold home antihypertensives for now ? ?Acute Kidney Injury ?(Appears his baseline Creatine is around 1.3-1.5) ?-Monitor I&O's / urinary output ?-Follow BMP ?-Ensure adequate renal perfusion ?-Avoid nephrotoxic agents as able ?-Replace electrolytes as indicated ? ?Acute Metabolic Encephalopathy in setting of CO2  Narcosis ?-Provide supportive care ?-Avoid sedating meds ?-Treatment of Hypercapnia as outlined above ?-Check UDS ? ? ?Best Practice (right click and "Reselect all SmartList Selections" daily)  ? ?Diet/type: NPO (while AMS) ?DVT prophylaxis: LMWH ?GI prophylaxis: Protonix ?Lines: N/A ?Foley:  N/A ?Code Status:  full code ?Last date of multidisciplinary goals of care discussion [N/A] ? ?Updated pt's significant other at bedside 4/6. ? ?Labs   ?CBC: ?Recent Labs  ?Lab 07/20/21 ?0916  ?WBC 7.3  ?NEUTROABS 5.0  ?HGB 12.0*  ?HCT 46.3  ?MCV 89.6  ?PLT 159  ? ? ?Basic Metabolic Panel: ?Recent Labs  ?Lab 07/20/21 ?0916  ?NA 141  ?K 3.9  ?CL 100  ?CO2 36*  ?GLUCOSE 121*  ?BUN 22  ?CREATININE 1.60*  ?CALCIUM 8.2*  ?MG 2.3  ? ?GFR: ?Estimated Creatinine Clearance: 82.2 mL/min (A) (by C-G formula based on SCr of 1.6 mg/dL (H)). ?Recent Labs  ?Lab 07/20/21 ?0916  ?PROCALCITON <0.10  ?WBC 7.3  ? ? ?Liver Function Tests: ?Recent Labs  ?Lab 07/20/21 ?0916  ?AST 13*  ?ALT 10  ?ALKPHOS 119  ?BILITOT 0.4  ?PROT 8.0  ?ALBUMIN 3.3*  ? ?No results for input(s): LIPASE, AMYLASE in the last 168 hours. ?No results for input(s): AMMONIA in the last 168 hours. ? ?ABG ?   ?Component Value Date/Time  ? PHART 7.52 (H) 03/17/2021 1000  ? PCO2ART 43 03/17/2021 1000  ? PO2ART 191 (H) 03/17/2021 1000  ? HCO3 45.3 (H) 07/20/2021 1414  ? O2SAT 56.8 07/20/2021 1414  ?  ? ?Coagulation Profile: ?Recent Labs  ?Lab 07/20/21 ?0916  ?INR 1.0  ? ? ?Cardiac Enzymes: ?No results for input(s):  CKTOTAL, CKMB, CKMBINDEX, TROPONINI in the last 168 hours. ? ?HbA1C: ?Hgb A1c MFr Bld  ?Date/Time Value Ref Range Status  ?05/04/2021 06:30 AM 5.6 4.8 - 5.6 % Final  ?  Comment:  ?  (NOTE) ?        Prediabetes: 5.7 - 6.4 ?        Diabetes: >6.4 ?        Glycemic control for adults with diabetes: <7.0 ?  ?01/10/2015 10:44 PM 5.5 4.0 - 6.0 % Final  ? ? ?CBG: ?No results for input(s): GLUCAP in the last 168 hours. ? ?Review of Systems:   ?Unable to assess due AMS &  BiPAP ? ?Past Medical History:  ?He,  has a past medical history of (HFpEF) heart failure with preserved ejection fraction ([place]), Acute hypercapnic respiratory failure ([place]) (41/28/7867), Acute metabolic encephalopathy

## 2021-07-20 NOTE — ED Notes (Signed)
RT called at this time to inform of VBG sent down to lab at this time.  ?

## 2021-07-20 NOTE — Progress Notes (Signed)
Pt transported to CT and back to ER on BIPAP without complications.  ?

## 2021-07-20 NOTE — ED Triage Notes (Signed)
From home via [facility]. Medic reports pt stated they have been sitting on the side of their bed since Monday and haven't moved. Pt reports weakness, seeing "flashes of stars". Pt reports they have a c-pap but are supposed to be on bi-pap at home. Pt reports they are swollen everywhere more than normal. ? ?114/67 ?68%RA ?83HR ?Placed on 15L NRB came up to 100% ?95% on 4L [place] ?

## 2021-07-20 NOTE — Progress Notes (Signed)
PHARMACIST - PHYSICIAN COMMUNICATION ? ?CONCERNING:  Enoxaparin (Lovenox) for DVT Prophylaxis  ? ?DESCRIPTION: ?Patient was prescribed enoxaprin '40mg'$  q24 hours for VTE prophylaxis.  ? Danley Danker Weights  ? 07/20/21 0908  ?Weight: (!) 181.4 kg (400 lb)  ? ? ?Body mass index is 48.69 kg/m?. ? ?Estimated Creatinine Clearance: 82.2 mL/min (A) (by C-G formula based on SCr of 1.6 mg/dL (H)). ? ? ?Based on [place] patient is candidate for enoxaparin 0.'5mg'$ /kg TBW SQ every 24 hours based on BMI being >30. ? ? ?RECOMMENDATION: ?Pharmacy has adjusted enoxaparin dose per [facility] policy. ? ?Patient is now receiving enoxaparin 90 mg every 24 hours  ? ? ?Darnelle Bos, PharmD ?Clinical Pharmacist  ?07/20/2021 ?4:29 PM ? ?

## 2021-07-20 NOTE — ED Notes (Signed)
Informed RN bed assigned 

## 2021-07-20 NOTE — Progress Notes (Signed)
Patient seen and examined in ICU approximately 1:30 PM.  At that time, very somnolent, minimally responsive to pain, opens eyes briefly, can follow commands such as "squeeze my hand."  Had been on BiPAP for some time, ordered another VBG, hypercarbia worsening despite treatment with BiPAP, DuoNeb, Lasix.  On reexam approximately 3:45 PM, patient was not responsive to pain or to voice, though per communication with girlfriend who is at bedside in ED doc, he is intermittently more awake and responsive.  I consulted intensivist, will hold off on admission orders for now pending intensivist recommendations.  Patient has history of multiple intubations.  Significant other at bedside reported noncompliance at home with oxygen, CPAP, diet, medications.  CTA was negative for PE but showed signs of pulmonary hypertension/cardiomegaly. ? ?Will update this note to H&P/consult note pending recommendations from intensivist  ?
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
## 2021-07-20 NOTE — ED Triage Notes (Signed)
Pt reports they are supposed to wear 2L [place] at baseline but haven't been.  ?

## 2021-07-21 ENCOUNTER — Inpatient Hospital Stay: Payer: Medicaid Other

## 2021-07-21 DIAGNOSIS — J441 Chronic obstructive pulmonary disease with (acute) exacerbation: Secondary | ICD-10-CM

## 2021-07-21 DIAGNOSIS — G4733 Obstructive sleep apnea (adult) (pediatric): Secondary | ICD-10-CM

## 2021-07-21 LAB — BASIC METABOLIC PANEL
Anion gap: 10 (ref 5–15)
BUN: 20 mg/dL (ref 8–23)
CO2: 34 mmol/L — ABNORMAL HIGH (ref 22–32)
Calcium: 8.4 mg/dL — ABNORMAL LOW (ref 8.9–10.3)
Chloride: 100 mmol/L (ref 98–111)
Creatinine, Ser: 1.4 mg/dL — ABNORMAL HIGH (ref 0.61–1.24)
GFR, Estimated: 56 mL/min — ABNORMAL LOW (ref >60)
Glucose, Bld: 69 mg/dL — ABNORMAL LOW (ref 70–99)
Potassium: 4.1 mmol/L (ref 3.5–5.1)
Sodium: 144 mmol/L (ref 135–145)

## 2021-07-21 LAB — BLOOD GAS, VENOUS
Acid-Base Excess: 10.1 mmol/L — ABNORMAL HIGH (ref 0.0–2.0)
Bicarbonate: 36.5 mmol/L — ABNORMAL HIGH (ref 20.0–28.0)
O2 Saturation: 98.7 %
Patient temperature: 37
pCO2, Ven: 55 mmHg (ref 44–60)
pH, Ven: 7.43 (ref 7.25–7.43)
pO2, Ven: 101 mmHg — ABNORMAL HIGH (ref 32–45)

## 2021-07-21 LAB — CBC
HCT: 45.8 % (ref 39.0–52.0)
Hemoglobin: 11.8 g/dL — ABNORMAL LOW (ref 13.0–17.0)
MCH: 22.6 pg — ABNORMAL LOW (ref 26.0–34.0)
MCHC: 25.8 g/dL — ABNORMAL LOW (ref 30.0–36.0)
MCV: 87.6 fL (ref 80.0–100.0)
Platelets: 142 10*3/uL — ABNORMAL LOW (ref 150–400)
RBC: 5.23 MIL/uL (ref 4.22–5.81)
RDW: 17.1 % — ABNORMAL HIGH (ref 11.5–15.5)
WBC: 5.9 10*3/uL (ref 4.0–10.5)
nRBC: 1.4 % — ABNORMAL HIGH (ref 0.0–0.2)

## 2021-07-21 LAB — MAGNESIUM: Magnesium: 2.2 mg/dL (ref 1.7–2.4)

## 2021-07-21 LAB — PHOSPHORUS: Phosphorus: 2.9 mg/dL (ref 2.5–4.6)

## 2021-07-21 MED ORDER — ASPIRIN EC 81 MG PO TBEC
81.0000 mg | DELAYED_RELEASE_TABLET | Freq: Every day | ORAL | Status: DC
Start: 2021-07-21 — End: 2021-07-23
  Administered 2021-07-21 – 2021-07-23 (×3): 81 mg via ORAL
  Filled 2021-07-21 (×3): qty 1

## 2021-07-21 MED ORDER — FUROSEMIDE 10 MG/ML IJ SOLN
80.0000 mg | Freq: Once | INTRAMUSCULAR | Status: AC
  Administered 2021-07-21: 80 mg via INTRAVENOUS
  Filled 2021-07-21: qty 8

## 2021-07-21 MED ORDER — PANTOPRAZOLE SODIUM 40 MG PO TBEC
40.0000 mg | DELAYED_RELEASE_TABLET | Freq: Every day | ORAL | Status: DC
  Administered 2021-07-21 – 2021-07-22 (×2): 40 mg via ORAL
  Filled 2021-07-21 (×2): qty 1

## 2021-07-21 MED ORDER — FUROSEMIDE 10 MG/ML IJ SOLN
40.0000 mg | Freq: Once | INTRAMUSCULAR | Status: AC
  Administered 2021-07-21: 40 mg via INTRAVENOUS
  Filled 2021-07-21: qty 4

## 2021-07-21 MED ORDER — FERROUS SULFATE 325 (65 FE) MG PO TABS
325.0000 mg | ORAL_TABLET | ORAL | Status: DC
  Administered 2021-07-21 – 2021-07-23 (×2): 325 mg via ORAL
  Filled 2021-07-21 (×2): qty 1

## 2021-07-21 MED ORDER — REVEFENACIN 175 MCG/3ML IN SOLN
175.0000 ug | Freq: Every day | RESPIRATORY_TRACT | Status: DC
  Administered 2021-07-21 – 2021-07-23 (×3): 175 ug via RESPIRATORY_TRACT
  Filled 2021-07-21 (×3): qty 3

## 2021-07-21 MED ORDER — CARVEDILOL 6.25 MG PO TABS
6.2500 mg | ORAL_TABLET | Freq: Every day | ORAL | Status: DC
  Administered 2021-07-21 – 2021-07-23 (×3): 6.25 mg via ORAL
  Filled 2021-07-21 (×3): qty 1

## 2021-07-21 MED ORDER — ATORVASTATIN CALCIUM 20 MG PO TABS
40.0000 mg | ORAL_TABLET | Freq: Every day | ORAL | Status: DC
  Administered 2021-07-21 – 2021-07-23 (×3): 40 mg via ORAL
  Filled 2021-07-21 (×3): qty 2

## 2021-07-21 MED ORDER — ARFORMOTEROL TARTRATE 15 MCG/2ML IN NEBU
15.0000 ug | INHALATION_SOLUTION | Freq: Two times a day (BID) | RESPIRATORY_TRACT | Status: DC
  Administered 2021-07-21 – 2021-07-23 (×5): 15 ug via RESPIRATORY_TRACT
  Filled 2021-07-21 (×6): qty 2

## 2021-07-21 NOTE — Progress Notes (Signed)
? ?NAME:  Gary [name], MRN:  [id number], DOB:  1955-09-07, LOS: 1 ?ADMISSION DATE:  07/20/2021, CONSULTATION DATE:  07/20/2021 ?REFERRING MD:  Dr. Tamala Julian, CHIEF COMPLAINT: Shortness of breath, weakness ? ?Brief Pt Description / Synopsis:  ?66 y.o. male admitted with acute on chronic hypoxic & hypercapnic respiratory failure with acute metabolic encephalopathy due to CO2 narcosis in the setting of OSA/OHS/noncompliance with CPAP, pulmonary hypertension, and suspected pulmonary edema requiring BiPAP.  High risk for intubation ? ?History of Present Illness:  ?Gary [name] is a 66 y.o. male with a past medical history as listed below who presented to [facility] ED on 07/20/2021 due to complaints of progressive shortness of breath with associated cough and generalized weakness. ? ?Patient is currently very lethargic, therefore history is obtained from patient's significant other at bedside along with chart review.  His significant other reports symptoms began on 4/3, and that he has only been intermittently wearing his oxygen.  She said he complained of a little bit of chest tightness yesterday, but none today.  Denies any knowledge of fevers, chills, abdominal pain, nausea, vomiting, diarrhea.  Patient denied any tobacco abuse, alcohol use, or illicit drug use.  Upon EMS arrival he was found to be hypoxic with O2 sat 68% on room air.  Was subsequently placed on nonrebreather mask with EMS. ? ?Of note he was recently hospitalized for acute COPD exacerbation and late March 2023.  He reported at that time his oxygen concentrator stopped working without him noticing with resultant hypoxic respiratory failure. ? ?ED Course: ?Initial vital signs: Temperature 98.2, respiratory rate 22, pulse 81, blood pressure 128/77, SPO2 92% on 4 L nasal cannula ?Significant Labs: Bicarbonate 36, glucose 121, BUN 22, creatinine 1.6, BNP 93.3, high-sensitivity troponin 8, WBC 7.3, procalcitonin less than 0.10, hemoglobin 12.0, hematocrit 46.3, D-dimer  0.71 ?VBG: 7.21/PCO2 97/PO2 33/bicarb 38.8 ?Repeat VBG: pH 7.2/PCO2 116/PO2 37/bicarb 45.3 ?COVID-19 PCR is pending ?Imaging: ?Chest x-ray>>IMPRESSION: ?Cardiomegaly with increased bilateral lung markings. Findings are ?concerning for vascular congestion or mild pulmonary edema. ?CTA chest>>IMPRESSION: ?1. No evidence of central pulmonary embolus. Evaluation of the more ?distal pulmonary arteries is limited due to patient body habitus and ?streak artifact. ?2. Dilated pulmonary arteries, findings can be seen in the setting ?of pulmonary hypertension. ?3. Cardiomegaly and aortic Atherosclerosis  ?Medications given: DuoNeb x1 dose, 40 mg IV Lasix ? ?Given his hypercapnia on VBG, he was placed on BiPAP by the ED provider.  Initially hospitalist was asked to admit, however became more lethargic and follow-up VBG with worsening hypercapnia. Therefore PCCM is asked to admit given that he is high risk for intubation. ? ? ?Pertinent  Medical History  ?Obstructive sleep apnea (on CPAP) ?COPD (on 2 L supplemental O2 at baseline) ?HFpEF ?Hypertension ?GI bleed ?Morbid obesity ? ? ?Micro Data:  ?4/6: SARS-CoV-2 and influenza PCR>> ? ?Antimicrobials:  ?N/A ? ?Significant Hospital Events: ?Including procedures, antibiotic start and stop dates in addition to other pertinent events   ?4/6: PCCM asked to admit to stepdown for BiPAP.  High risk for intubation ?4/7: off BiPAP ? ?Interim History / Subjective:  ?Off BiPAP this AM. Alert and oriented; totally conversant. Diuresed well overnight. No new issues. ? ?Objective   ?Blood pressure 120/83, pulse 84, temperature 98.2 ?F (36.8 ?C), temperature source Axillary, resp. rate 19, height '6\' 4"'$  (1.93 m), weight (!) 186.3 kg, SpO2 92 %. ?   ?FiO2 (%):  [30 %] 30 %  ? ?Intake/Output Summary (Last 24 hours) at 07/21/2021 0958 ?Last data  filed at 07/21/2021 0800 ?Gross per 24 hour  ?Intake 240 ml  ?Output 2450 ml  ?Net -2210 ml  ? ?Filed Weights  ? 07/20/21 0908 07/20/21 1705 07/21/21 0500   ?Weight: (!) 181.4 kg (!) 181.4 kg (!) 186.3 kg  ? ? ?Examination: ?General: African American male in NAD ?Lungs: CTA b/l, no W/C/R ?Cardiovascular: Regular rate and rhythm with occasional PVC, S1-S2, no murmurs, rubs, gallops ?Abdomen: Obese, soft, nontender, distant bowel sounds ?Extremities: Normal bulk and tone, no deformities, 2+ pitting edema bilateral lower extremity ?Neuro: A/O x4 ?GU: Deferred ? ?Resolved Hospital Problem list   ? ? ?Assessment & Plan:  ? ?Acute on Chronic Hypoxic & Hypercapnic Respiratory Failure in the setting of OSA/OHS (? noncompliance with CPAP), Pulmonary Hypertension, and suspected Pulmonary edema ?COPD without acute exacerbation ?PMHx: COPD (on 2L supplemental O2 at baseline) ?-Supplemental O2 as needed to maintain O2 sats 88 to 92% ?-BiPAP QHS and PRN; educated patient on importance of compliance with home PAP ?-Scheduled bronchodilators (Brovana, Pulmicort, Yupelri) ?-Diurese for goal 1L negative fluid balance ?-Repeat VBG this AM ? ?Acute on Chronic HFpEF ?PMHx: Hypertension ?-Continuous cardiac monitoring ?-Maintain MAP >65 ?-HS Troponin negative x2 ?-BNP 99.3 on admission ?-Diuresis as blood pressure and renal function permits ~received 40 mg IV Lasix x1 dose in the ED, noted to take 40 mg torsemide at home, discussed with Dr. Tacy Learn, will give an additional 40 mg of IV Lasix ?-Echocardiogram 02/21/2021: LVEF 60 to 93%, grade 3 diastolic dysfunction, RV systolic function normal ?-Resume home COreg ? ?Acute Kidney Injury, improved ?(Appears his baseline Creatine is around 1.3-1.5) ?-Monitor I&O's / urinary output ?-Follow BMP ?-Ensure adequate renal perfusion ?-Avoid nephrotoxic agents as able ?-Replace electrolytes as indicated ? ?Acute Metabolic Encephalopathy in setting of CO2 Narcosis ?-Provide supportive care ?-Avoid sedating meds ?-Treatment of Hypercapnia as outlined above ?-Check UDS ? ?Best Practice (right click and "Reselect all SmartList Selections" daily)   ? ?Diet/type: Regular consistency (see orders) ?DVT prophylaxis: LMWH ?GI prophylaxis: Protonix ?Lines: N/A ?Foley:  N/A ?Code Status:  full code ?Last date of multidisciplinary goals of care discussion [4/7] ? ?Labs   ?CBC: ?Recent Labs  ?Lab 07/20/21 ?0916 07/21/21 ?5701  ?WBC 7.3 5.9  ?NEUTROABS 5.0  --   ?HGB 12.0* 11.8*  ?HCT 46.3 45.8  ?MCV 89.6 87.6  ?PLT 159 142*  ? ? ?Basic Metabolic Panel: ?Recent Labs  ?Lab 07/20/21 ?0916 07/21/21 ?7793  ?NA 141 144  ?K 3.9 4.1  ?CL 100 100  ?CO2 36* 34*  ?GLUCOSE 121* 69*  ?BUN 22 20  ?CREATININE 1.60* 1.40*  ?CALCIUM 8.2* 8.4*  ?MG 2.3 2.2  ?PHOS  --  2.9  ? ?GFR: ?Estimated Creatinine Clearance: 95.5 mL/min (A) (by C-G formula based on SCr of 1.4 mg/dL (H)). ?Recent Labs  ?Lab 07/20/21 ?0916 07/21/21 ?9030  ?PROCALCITON <0.10  --   ?WBC 7.3 5.9  ? ? ?Liver Function Tests: ?Recent Labs  ?Lab 07/20/21 ?0916  ?AST 13*  ?ALT 10  ?ALKPHOS 119  ?BILITOT 0.4  ?PROT 8.0  ?ALBUMIN 3.3*  ? ?No results for input(s): LIPASE, AMYLASE in the last 168 hours. ?No results for input(s): AMMONIA in the last 168 hours. ? ?ABG ?   ?Component Value Date/Time  ? PHART 7.52 (H) 03/17/2021 1000  ? PCO2ART 43 03/17/2021 1000  ? PO2ART 191 (H) 03/17/2021 1000  ? HCO3 36.5 (H) 07/21/2021 0752  ? O2SAT 98.7 07/21/2021 0752  ?  ? ?Coagulation Profile: ?Recent Labs  ?Lab 07/20/21 ?0916  ?  INR 1.0  ? ? ?Cardiac Enzymes: ?No results for input(s): CKTOTAL, CKMB, CKMBINDEX, TROPONINI in the last 168 hours. ? ?HbA1C: ?Hgb A1c MFr Bld  ?Date/Time Value Ref Range Status  ?05/04/2021 06:30 AM 5.6 4.8 - 5.6 % Final  ?  Comment:  ?  (NOTE) ?        Prediabetes: 5.7 - 6.4 ?        Diabetes: >6.4 ?        Glycemic control for adults with diabetes: <7.0 ?  ?01/10/2015 10:44 PM 5.5 4.0 - 6.0 % Final  ? ? ?CBG: ?Recent Labs  ?Lab 07/20/21 ?1717  ?GLUCAP 73  ? ? ?Review of Systems:   ?Unable to assess due AMS & BiPAP ? ?Past Medical History:  ?He,  has a past medical history of (HFpEF) heart failure with preserved  ejection fraction ([place]), Acute hypercapnic respiratory failure ([place]) (85/27/7824), Acute metabolic encephalopathy (23/53/6144), Acute on chronic respiratory failure with hypoxia and hypercapnia ([place]) (05/28/2018), AKI

## 2021-07-21 NOTE — Progress Notes (Signed)
PHARMACIST - PHYSICIAN COMMUNICATION ? ?CONCERNING: IV to Oral Route Change Policy ? ?RECOMMENDATION: ?This patient is receiving pantoprazole by the intravenous route.  Based on criteria approved by the Pharmacy and Therapeutics Committee, the intravenous medication(s) is/are being converted to the equivalent oral dose form(s). ? ? ?DESCRIPTION: ?These criteria include: ?The patient is eating (either orally or via tube) and/or has been taking other orally administered medications for a least 24 hours ?The patient has no evidence of active gastrointestinal bleeding or impaired GI absorption (gastrectomy, short bowel, patient on TNA or NPO). ? ?If you have questions about this conversion, please contact the Pharmacy Department  ? ?Benita Gutter, RPH ?07/21/2021 10:57 AM  ?

## 2021-07-21 NOTE — Progress Notes (Signed)
PHARMACY CONSULT NOTE ? ?Pharmacy Consult for Electrolyte Monitoring and Replacement  ? ?Recent Labs: ?Potassium (mmol/L)  ?Date Value  ?07/21/2021 4.1  ?04/15/2014 3.9  ? ?Magnesium (mg/dL)  ?Date Value  ?07/21/2021 2.2  ?08/21/2012 1.9  ? ?Calcium (mg/dL)  ?Date Value  ?07/21/2021 8.4 (L)  ? ?Calcium, Total (mg/dL)  ?Date Value  ?04/15/2014 8.3 (L)  ? ?Albumin (g/dL)  ?Date Value  ?07/20/2021 3.3 (L)  ?04/15/2014 2.9 (L)  ? ?Phosphorus (mg/dL)  ?Date Value  ?07/21/2021 2.9  ? ?Sodium (mmol/L)  ?Date Value  ?07/21/2021 144  ?03/15/2020 145 (H)  ?04/15/2014 140  ? ? ?Assessment: 66 y.o. male with a past medical history of obesity, COPD, CHF, previous GI bleeds, HTN and OSA chronically on 2 L nasal cannula and CPAP at night who presents via EMS from home for evaluation of worsening shortness of breath. PCCM asked to admit to stepdown for BiPAP.  High risk for intubation. Pharmacy is asked to follow and replace electrolytes while in the CCU ? ?Diuresis: IV Lasix 80 mg x 1 on 4/7 ? ?Goal of Therapy:  ?Electrolytes within normal limits ? ?Plan:  ?No electrolyte replacement warranted at this time ?Recheck electrolytes in AM ? ?Benita Gutter ?07/21/2021 7:41 AM ? ?

## 2021-07-21 NOTE — Evaluation (Signed)
Physical Therapy Evaluation ?Patient Details ?Name: Gary [name] ?MRN: [id number] ?DOB: 1955/10/01 ?Today's Date: 07/21/2021 ? ?History of Present Illness ? Gary [name] is a 69yoM who comes to [facility] on 07/20/21 with SOB, weight gain, AMS. Work-up suggestive of acute on chronic hypercapnic/hypoxic respiratory failure. PMH: CHF with grade 3 diastolic dysfunction, COPD, HTN, PVD, COPD, COVID-19, and OSA. Pt reports since prior admission, he had progressed well, back to working some, using portable concentrator when out, endorses a couple falls.  ?Clinical Impression ? Pt admitted with above diagnosis. Pt currently with functional limitations due to the deficits listed below (see "PT Problem List"). Upon entry, pt in bed, awake and agreeable to participate. The pt is alert, pleasant, interactive, and able to provide info regarding prior level of function, both in tolerance and independence. Pt able to perform bed mobility, STS, and sustained standing at bedside x1 minute with VSS, no c/o  dizziness. Pt exhibits significant weakness in trunk and BLE, but compensates well with DME and BUE strength, reports his current performance to be only mildly off from his baseline. Patient's performance this date reveals decreased ability, independence, and tolerance in performing all basic mobility required for performance of activities of daily living. Pt requires additional DME, close physical assistance, and cues for safe participate in mobility. Pt will benefit from skilled PT intervention to increase independence and safety with basic mobility in preparation for discharge to the venue listed below.   ? ? ?   ? ?Recommendations for follow up therapy are one component of a multi-disciplinary discharge planning process, led by the attending physician.  Recommendations may be updated based on patient status, additional functional criteria and insurance authorization. ? ?Follow Up Recommendations Home health PT (Pt reports he ialready  active with HHPT services) ? ?  ?Assistance Recommended at Discharge Set up Supervision/Assistance  ?Patient can return home with the following ? Assistance with cooking/housework;A little help with bathing/dressing/bathroom;A little help with walking and/or transfers ? ?  ?Equipment Recommendations None recommended by PT;Other (comment) (portable concentrator is not charging)  ?Recommendations for Other Services ?    ?  ?Functional Status Assessment Patient has had a recent decline in their functional status and demonstrates the ability to make significant improvements in function in a reasonable and predictable amount of time.  ? ?  ?Precautions / Restrictions Precautions ?Precautions: Fall ?Restrictions ?Weight Bearing Restrictions: No  ? ?  ? ?Mobility ? Bed Mobility ?Overal bed mobility: Modified Independent ?  ?  ?  ?  ?  ?  ?General bed mobility comments: requires elevated HOB, bed rail, heavy effort (not far off baseline) ?  ? ?Transfers ?Overall transfer level: Needs assistance ?  ?Transfers: Sit to/from Stand ?Sit to Stand: Max assist ?  ?  ?  ?  ?  ?General transfer comment: is able to rise with heavy UE support from very elevated surface height (>20"), uses a chairback for intermittent support. ?  ? ?Ambulation/Gait ?Ambulation/Gait assistance:  (deferred at eval) ?  ?  ?  ?  ?  ?  ?  ? ?Stairs ?  ?  ?  ?  ?  ? ?Wheelchair Mobility ?  ? ?Modified Rankin (Stroke Patients Only) ?  ? ?  ? ?Balance   ?  ?  ?  ?  ?  ?  ?  ?  ?  ?  ?  ?  ?  ?  ?  ?  ?  ?  ?   ? ? ? ?  Pertinent Vitals/Pain Pain Assessment ?Pain Assessment: No/denies pain  ? ? ?Home Living Family/patient expects to be discharged to:: Private residence ?Living Arrangements: Spouse/significant other ?Available Help at Discharge: Family ?Type of Home: Apartment ?Home Access: Stairs to enter ?Entrance Stairs-Rails: None ?Entrance Stairs-Number of Steps: 1 ?  ?Home Layout: One level ?Home Equipment: Conservation officer, nature (2 wheels);Cane - single  point;BSC/3in1;Hospital bed ?Additional Comments: O2 CPAP, O2. Reports getting rird of lift becuase did not need it.  ?  ?Prior Function Prior Level of Function : Needs assist ?  ?  ?  ?  ?  ?  ?Mobility Comments: Reports ambulating household distances with RW since previous admission. ?ADLs Comments: Prior to acute hospitalization pt required assit in all ADL/IADL due to previous hospitzliations; prior to that he was I in ADL, assist with IADL as needed ?  ? ? ?Hand Dominance  ? Dominant Hand: Right ? ?  ?Extremity/Trunk Assessment  ? Upper Extremity Assessment ?Upper Extremity Assessment: Overall WFL for tasks assessed;Generalized weakness ?  ? ?Lower Extremity Assessment ?Lower Extremity Assessment: Overall WFL for tasks assessed;Generalized weakness ?  ? ?   ?Communication  ? Communication: No difficulties  ?Cognition Arousal/Alertness: Awake/alert ?Behavior During Therapy: [facility] for tasks assessed/performed ?Overall Cognitive Status: Within Functional Limits for tasks assessed ?  ?  ?  ?  ?  ?  ?  ?  ?  ?  ?  ?  ?  ?  ?  ?  ?  ?  ?  ? ?  ?General Comments   ? ?  ?Exercises    ? ?Assessment/Plan  ?  ?PT Assessment Patient needs continued PT services  ?PT Problem List Decreased strength;Decreased range of motion;Decreased activity tolerance;Decreased balance;Decreased mobility;Decreased coordination;Cardiopulmonary status limiting activity ? ?   ?  ?PT Treatment Interventions DME instruction;Balance training;Gait training;Stair training;Functional mobility training;Therapeutic activities;Therapeutic exercise;Patient/family education   ? ?PT Goals (Current goals can be found in the Care Plan section)  ?Acute Rehab PT Goals ?Patient Stated Goal: regain baseline AMB ?PT Goal Formulation: With patient ?Time For Goal Achievement: 08/04/21 ?Potential to Achieve Goals: Good ? ?  ?Frequency Min 2X/week ?  ? ? ?Co-evaluation   ?  ?  ?  ?  ? ? ?  ?AM-PAC PT "6 Clicks" Mobility  ?Outcome Measure Help needed turning from your  back to your side while in a flat bed without using bedrails?: A Lot ?Help needed moving from lying on your back to sitting on the side of a flat bed without using bedrails?: A Lot ?Help needed moving to and from a bed to a chair (including a wheelchair)?: A Lot ?Help needed standing up from a chair using your arms (e.g., wheelchair or bedside chair)?: A Lot ?Help needed to walk in hospital room?: A Little ?Help needed climbing 3-5 steps with a railing? : A Little ?6 Click Score: 14 ? ?  ?End of Session Equipment Utilized During Treatment: Oxygen ?Activity Tolerance: Patient tolerated treatment well;No increased pain;Patient limited by fatigue ?Patient left: in bed;with call bell/phone within reach ?Nurse Communication: Mobility status (soiled bed, need for bari [facility]) ?PT Visit Diagnosis: Unsteadiness on feet (R26.81);Difficulty in walking, not elsewhere classified (R26.2) ?  ? ?Time: 2229-7989 ?PT Time Calculation (min) (ACUTE ONLY): 20 min ? ? ?Charges:   PT Evaluation ?$PT Eval Moderate Complexity: 1 Mod ?PT Treatments ?$Therapeutic Exercise: 8-22 mins ?  ?   ? ?4:09 PM, 07/21/21 ?Etta Grandchild, PT, DPT ?Physical Therapist - [place] ?[place]  [facility]  ?[phone] (ASCOM)  ? ? ?Kyndahl Jablon C ?07/21/2021, 4:08 PM ? ?

## 2021-07-22 DIAGNOSIS — J438 Other emphysema: Secondary | ICD-10-CM

## 2021-07-22 DIAGNOSIS — N1831 Chronic kidney disease, stage 3a: Secondary | ICD-10-CM

## 2021-07-22 DIAGNOSIS — G9341 Metabolic encephalopathy: Secondary | ICD-10-CM

## 2021-07-22 DIAGNOSIS — I501 Left ventricular failure: Secondary | ICD-10-CM

## 2021-07-22 LAB — CBC
HCT: 41.7 % (ref 39.0–52.0)
Hemoglobin: 11.4 g/dL — ABNORMAL LOW (ref 13.0–17.0)
MCH: 23.2 pg — ABNORMAL LOW (ref 26.0–34.0)
MCHC: 27.3 g/dL — ABNORMAL LOW (ref 30.0–36.0)
MCV: 84.9 fL (ref 80.0–100.0)
Platelets: 125 10*3/uL — ABNORMAL LOW (ref 150–400)
RBC: 4.91 MIL/uL (ref 4.22–5.81)
RDW: 16.6 % — ABNORMAL HIGH (ref 11.5–15.5)
WBC: 6.4 10*3/uL (ref 4.0–10.5)
nRBC: 0.6 % — ABNORMAL HIGH (ref 0.0–0.2)

## 2021-07-22 LAB — BASIC METABOLIC PANEL
Anion gap: 5 (ref 5–15)
BUN: 23 mg/dL (ref 8–23)
CO2: 37 mmol/L — ABNORMAL HIGH (ref 22–32)
Calcium: 8 mg/dL — ABNORMAL LOW (ref 8.9–10.3)
Chloride: 96 mmol/L — ABNORMAL LOW (ref 98–111)
Creatinine, Ser: 1.37 mg/dL — ABNORMAL HIGH (ref 0.61–1.24)
GFR, Estimated: 58 mL/min — ABNORMAL LOW (ref >60)
Glucose, Bld: 112 mg/dL — ABNORMAL HIGH (ref 70–99)
Potassium: 4.6 mmol/L (ref 3.5–5.1)
Sodium: 138 mmol/L (ref 135–145)

## 2021-07-22 LAB — MAGNESIUM: Magnesium: 2 mg/dL (ref 1.7–2.4)

## 2021-07-22 LAB — PHOSPHORUS: Phosphorus: 3.2 mg/dL (ref 2.5–4.6)

## 2021-07-22 MED ORDER — TORSEMIDE 20 MG PO TABS
20.0000 mg | ORAL_TABLET | Freq: Every day | ORAL | Status: DC
  Administered 2021-07-22 – 2021-07-23 (×2): 20 mg via ORAL
  Filled 2021-07-22 (×2): qty 1

## 2021-07-22 MED ORDER — ACETAMINOPHEN 325 MG PO TABS
650.0000 mg | ORAL_TABLET | Freq: Four times a day (QID) | ORAL | Status: DC | PRN
  Administered 2021-07-22: 650 mg via ORAL
  Filled 2021-07-22: qty 2

## 2021-07-22 NOTE — Assessment & Plan Note (Signed)
Patient condition was caused by pulmonary edema after eating very salty food.  He was sleeping for 2 days without CPAP, causing severe CO2 retention.  He has received IV Lasix initially. ?Patient condition so far had improved.  Patient is advised to wear his CPAP constantly. ?

## 2021-07-22 NOTE — Assessment & Plan Note (Addendum)
Patient had echocardiogram on 02/2021 with ejection fraction 60 to 65% associated with grade 3 diastolic dysfunction.  Patient has significant volume overload from eating salty food at time of admission.  He received IV fluids, condition had improved.  Resume home dose torsemide.   ?

## 2021-07-22 NOTE — Assessment & Plan Note (Signed)
chronic

## 2021-07-22 NOTE — Assessment & Plan Note (Addendum)
Patient has a severe obstructive sleep apnea morbid obesity and obesity hypoventilation syndrome.  He is advised to use CPAP every time he is asleep.  I will also refer patient to pulmonology after discharge to see if there is any need for adjustment of CPAP setting. ?

## 2021-07-22 NOTE — Assessment & Plan Note (Signed)
Condition has improved. ?

## 2021-07-22 NOTE — Progress Notes (Signed)
?  Progress Note ? ? ?Patient: Gary [name] EFE:[id number] DOB: Aug 10, 1955 DOA: 07/20/2021     2 ?DOS: the patient was seen and examined on 07/22/2021 ?  ?Brief hospital course: ?66 y.o. male admitted with acute on chronic hypoxic & hypercapnic respiratory failure with acute metabolic encephalopathy due to CO2 narcosis in the setting of OSA/OHS/noncompliance with CPAP, pulmonary hypertension, and suspected pulmonary edema requiring BiPAP. ?Patient states that he ate very salty food on Monday and Tuesday, he was also noncompliant with his CPAP.  He came very sleepy on Wednesday, he slept for 2 days without eating, not wearing CPAP. ?Upon arriving the hospital, he appeared to have significant pulmonary edema, he was placed on BiPAP.  Condition is gradually improving. ? ?Assessment and Plan: ?Thrombocytopenia ([place]) ?chronic ? ?Chronic kidney disease, stage 3a ([place]) ?AKI ruled out. ?Reviewed prior lab, patient had stable GFR. Currently creatine of 1.4.  ? ?Chronic obstructive pulmonary disease ([name]) ?Patient has a severe obstructive sleep apnea morbid obesity and obesity hypoventilation syndrome.  He will need CPAP while asleep.  I will also refer patient to pulmonology after discharge to see if there is any need for adjustment of CPAP setting. ? ?Acute on chronic respiratory failure with hypoxia and hypercapnia ([name]) ?Patient condition was caused by pulmonary edema after eating very salty food.  He was sleeping for 2 days without CPAP, causing severe CO2 retention.  He has received IV Lasix initially. ?Patient condition so far had improved.  Patient is advised to wear his CPAP constantly. ? ?Acute metabolic encephalopathy ?This appears to be secondary to severe CO2 retention.  Condition has improved. ? ?Obesity, Class III, BMI 40-49.9 (morbid obesity) ([place]) ?Diet and exercise advised. ? ? ? ? ?  ? ?Subjective:  ?Patient feels much better today, he did not wear his CPAP last night.  Short of breath is better. ? ?Physical  Exam: ?Vitals:  ? 07/22/21 0436 07/22/21 0738 07/22/21 0802 07/22/21 1120  ?BP:   123/88 114/78  ?Pulse:   75 77  ?Resp:   17 18  ?Temp:   98.1 ?F (36.7 ?C) 98.1 ?F (36.7 ?C)  ?TempSrc:   Oral   ?SpO2:  92% 92% 99%  ?Weight: (!) 181.5 kg     ?Height:      ? ?General exam: Appears calm and comfortable, morbid obesity ?Respiratory system: Decreased breathing sounds. Respiratory effort normal. ?Cardiovascular system: S1 & S2 heard, RRR. No JVD, murmurs, rubs, gallops or clicks. No pedal edema. ?Gastrointestinal system: Abdomen is nondistended, soft and nontender. No organomegaly or masses felt. Normal bowel sounds heard. ?Central nervous system: Alert and oriented. No focal neurological deficits. ?Extremities: Symmetric 5 x 5 power. ?Skin: No rashes, lesions or ulcers ?Psychiatry: Judgement and insight appear normal. Mood & affect appropriate.  ? ?Data Reviewed: ? ?Labs reviewed, chest x-ray reviewed. ? ?Family Communication:  ? ?Disposition: ?Status is: Inpatient ?Remains inpatient appropriate because: Severity of disease. ? Planned Discharge Destination: Home ? ? ? ?Time spent: 28 minutes ? ?Author: ?Sharen Hones, MD ?07/22/2021 11:56 AM ? ?For on call review [email or web address].  ?

## 2021-07-22 NOTE — Hospital Course (Signed)
65 y.o. male admitted with acute on chronic hypoxic & hypercapnic respiratory failure with acute metabolic encephalopathy due to CO2 narcosis in the setting of OSA/OHS/noncompliance with CPAP, pulmonary hypertension, and suspected pulmonary edema requiring BiPAP. ?Patient states that he ate very salty food on Monday and Tuesday, he was also noncompliant with his CPAP.  He came very sleepy on Wednesday, he slept for 2 days without eating, not wearing CPAP. ?Upon arriving the hospital, he appeared to have significant pulmonary edema, he was placed on BiPAP.  Condition is gradually improving. ?

## 2021-07-22 NOTE — Progress Notes (Signed)
PHARMACY CONSULT NOTE ? ?Pharmacy Consult for Electrolyte Monitoring and Replacement  ? ?Recent Labs: ?Potassium (mmol/L)  ?Date Value  ?07/21/2021 4.1  ?04/15/2014 3.9  ? ?Magnesium (mg/dL)  ?Date Value  ?07/21/2021 2.2  ?08/21/2012 1.9  ? ?Calcium (mg/dL)  ?Date Value  ?07/21/2021 8.4 (L)  ? ?Calcium, Total (mg/dL)  ?Date Value  ?04/15/2014 8.3 (L)  ? ?Albumin (g/dL)  ?Date Value  ?07/20/2021 3.3 (L)  ?04/15/2014 2.9 (L)  ? ?Phosphorus (mg/dL)  ?Date Value  ?07/21/2021 2.9  ? ?Sodium (mmol/L)  ?Date Value  ?07/21/2021 144  ?03/15/2020 145 (H)  ?04/15/2014 140  ? ? ?Assessment: 66 y.o. male with a past medical history of obesity, COPD, CHF, previous GI bleeds, HTN and OSA chronically on 2 L nasal cannula and CPAP at night who presents via EMS from home for evaluation of worsening shortness of breath. PCCM asked to admit to stepdown for BiPAP.  High risk for intubation. Pharmacy is asked to follow and replace electrolytes while in the CCU ? ?Diuresis: IV Lasix 80 mg x 1 on 4/7 ? ?Goal of Therapy:  ?Electrolytes within normal limits ? ?Plan:  ?No electrolyte replacement warranted at this time ?F/u electrolytes in AM ? ?Evlyn Amason A ?07/22/2021 9:31 AM ? ?

## 2021-07-22 NOTE — Assessment & Plan Note (Signed)
This appears to be secondary to severe CO2 retention.  Condition has improved. ?

## 2021-07-22 NOTE — Assessment & Plan Note (Signed)
Diet and exercise advised. ?

## 2021-07-22 NOTE — Assessment & Plan Note (Addendum)
AKI ruled out. ?Reviewed prior lab, patient had stable GFR. Stable. ?

## 2021-07-23 DIAGNOSIS — I501 Left ventricular failure: Secondary | ICD-10-CM

## 2021-07-23 NOTE — Discharge Summary (Signed)
?Physician Discharge Summary ?  ?Patient: Gary [name] MRN: [id number] DOB: 1955/06/03  ?Admit date:     07/20/2021  ?Discharge date: 07/23/21  ?Discharge Physician: Sharen Hones  ? ?PCP: [facility] [employer] [facility]  ? ?Recommendations at discharge:  ? ?Follow-up with PCP in 1 week. ?Follow-up with cardiology in 2 weeks. ?Follow-up with pulmonology to see if CPAP need to be adjusted. ? ?Discharge Diagnoses: ?Active Problems: ?  Thrombocytopenia ([place]) ?  Obesity, Class III, BMI 40-49.9 (morbid obesity) ([place]) ?  Acute on chronic diastolic CHF (congestive heart failure) ([place]) ?  Acute metabolic encephalopathy ?  Acute on chronic respiratory failure with hypoxia and hypercapnia ([name]) ?  Chronic obstructive pulmonary disease ([name]) ?  Positive D dimer ?  Chronic kidney disease, stage 3a ([place]) ?  Acute cardiac pulmonary edema ([name]) ? ?Resolved Problems: ?  * No resolved hospital problems. * ? ?Hospital Course: ?66 y.o. male admitted with acute on chronic hypoxic & hypercapnic respiratory failure with acute metabolic encephalopathy due to CO2 narcosis in the setting of OSA/OHS/noncompliance with CPAP, pulmonary hypertension, and suspected pulmonary edema requiring BiPAP. ?Patient states that he ate very salty food on Monday and Tuesday, he was also noncompliant with his CPAP.  He came very sleepy on Wednesday, he slept for 2 days without eating, not wearing CPAP. ?Upon arriving the hospital, he appeared to have significant pulmonary edema, he was placed on BiPAP.  Condition is gradually improving. ? ?Assessment and Plan: ?Thrombocytopenia ([place]) ?chronic ? ?Acute cardiac pulmonary edema ([name]) ?Condition has improved. ? ?Chronic kidney disease, stage 3a ([place]) ?AKI ruled out. ?Reviewed prior lab, patient had stable GFR. Stable. ? ?Chronic obstructive pulmonary disease ([place]) ?Patient has a severe obstructive sleep apnea morbid obesity and obesity hypoventilation syndrome.  He is advised to use CPAP every time he is asleep.  I  will also refer patient to pulmonology after discharge to see if there is any need for adjustment of CPAP setting. ? ?Acute on chronic respiratory failure with hypoxia and hypercapnia ([name]) ?Patient condition was caused by pulmonary edema after eating very salty food.  He was sleeping for 2 days without CPAP, causing severe CO2 retention.  He has received IV Lasix initially. ?Patient condition so far had improved.  Patient is advised to wear his CPAP constantly. ? ?Acute metabolic encephalopathy ?This appears to be secondary to severe CO2 retention.  Condition has improved. ? ?Acute on chronic diastolic CHF (congestive heart failure) ([place]) ?Patient had echocardiogram on 02/2021 with ejection fraction 60 to 65% associated with grade 3 diastolic dysfunction.  Patient has significant volume overload from eating salty food at time of admission.  He received IV fluids, condition had improved.  Resume home dose torsemide.   ? ?Obesity, Class III, BMI 40-49.9 (morbid obesity) ([place]) ?Diet and exercise advised. ? ? ? ? ?  ? ? ?Consultants: ICU ?Procedures performed: Bipap  ?Disposition: Home ?Diet recommendation:  ?Discharge Diet Orders (From admission, onward)  ? ?  Start     Ordered  ? 07/23/21 0000  Diet - low sodium heart healthy       ? 07/23/21 0940  ? ?  ?  ? ?  ? ?Cardiac diet ?DISCHARGE MEDICATION: ?Allergies as of 07/23/2021   ?No Known Allergies ?  ? ?  ?Medication List  ?  ? ?STOP taking these medications   ? ?fluticasone 50 MCG/ACT nasal spray ?Commonly known as: FLONASE ?  ?phentermine 37.5 MG capsule ?  ? ?  ? ?TAKE these medications   ? ?  acetaminophen 500 MG tablet ?Commonly known as: TYLENOL ?Take 500 mg by mouth every 6 (six) hours as needed. ?  ?aspirin EC 81 MG tablet ?Take 81 mg by mouth daily. Swallow whole. ?  ?budesonide 0.25 MG/2ML nebulizer solution ?Commonly known as: PULMICORT ?Take 2 mLs (0.25 mg total) by nebulization 2 (two) times daily. ?  ?carvedilol 6.25 MG tablet ?Commonly known as:  COREG ?Take 1 tablet (6.25 mg total) by mouth daily. Skip dose if systolic BP less than 546 mmHg and/or heart rate less than 65 ?  ?dapagliflozin propanediol 10 MG Tabs tablet ?Commonly known as: [place] ?Take 1 tablet (10 mg total) by mouth daily before breakfast. ?  ?ferrous sulfate 325 (65 FE) MG tablet ?Take 1 tablet (325 mg total) by mouth 2 (two) times daily with a meal. ?  ?ipratropium-albuterol 0.5-2.5 (3) MG/3ML Soln ?Commonly known as: DUONEB ?Take 3 mLs by nebulization every 6 (six) hours. ?What changed:  ?when to take this ?reasons to take this ?  ?Combivent Respimat 20-100 MCG/ACT Aers respimat ?Generic drug: Ipratropium-Albuterol ?Inhale 2 puffs into the lungs in the morning and at bedtime. ?What changed: Another medication with the same name was changed. Make sure you understand how and when to take each. ?  ?pantoprazole 40 MG tablet ?Commonly known as: PROTONIX ?Take 1 tablet (40 mg total) by mouth daily. ?  ?potassium chloride 10 MEQ tablet ?Commonly known as: KLOR-CON M ?Take 1 tablet (10 mEq total) by mouth daily. ?  ?simvastatin 10 MG tablet ?Commonly known as: ZOCOR ?Take 1 tablet (10 mg total) by mouth daily at 6 PM. ?  ?torsemide 20 MG tablet ?Commonly known as: DEMADEX ?Take 40 mg by mouth daily. ?  ?Vitamin D (Ergocalciferol) 1.25 MG (50000 UNIT) Caps capsule ?Commonly known as: DRISDOL ?Take 1 capsule (50,000 Units total) by mouth every 7 (seven) days. ?  ? ?  ? ? Follow-up Information   ? ? [facility] Follow up in 1 week(s).   ?Specialty: General Practice ?Contact information: ?[address] ?[place] [address] ?[phone] ? ? ?  ?  ? ? [name], MD Follow up in 2 week(s).   ?Specialty: Cardiology ?Contact information: ?[address] ?[place] [address] ?[phone] ? ? ?  ?  ? ? [name], MD Follow up in 2 week(s).   ?Specialty: Pulmonary Disease ?Contact information: ?[address] ?[place]  [address] ?[phone] ? ? ?  ?  ? ?  ?  ? ?  ? ?Discharge Exam: ?Filed Weights  ? 07/21/21 0500 07/22/21 0436 07/23/21 0347  ?Weight: (!) 186.3 kg (!) 181.5 kg (!) 185.5 kg  ? ?General exam: Appears calm and comfortable, morbid obese ?Respiratory system: Clear to auscultation. Respiratory effort normal. ?Cardiovascular system: S1 & S2 heard, RRR. No JVD, murmurs, rubs, gallops or clicks. No pedal edema. ?Gastrointestinal system: Abdomen is nondistended, soft and nontender. No organomegaly or masses felt. Normal bowel sounds heard. ?Central nervous system: Alert and oriented. No focal neurological deficits. ?Extremities: Symmetric 5 x 5 power. ?Skin: No rashes, lesions or ulcers ?Psychiatry: Judgement and insight appear normal. Mood & affect appropriate.  ? ? ?Condition at discharge: good ? ?The results of significant diagnostics from this hospitalization (including imaging, microbiology, ancillary and laboratory) are listed below for reference.  ? ?Imaging Studies: ?DG Chest 2 View ? ?Result Date: 07/20/2021 ?CLINICAL DATA:  Shortness of breath. EXAM: CHEST - 2 VIEW COMPARISON:  Chest radiograph 05/11/2021 FINDINGS: Again noted  is enlargement of the cardiomediastinal silhouette. Enlarged central vascular structures. Slightly increased lung markings bilaterally particularly in the upper lobes. No large pleural effusions. Bridging osteophytes in the thoracic spine. IMPRESSION: Cardiomegaly with increased bilateral lung markings. Findings are concerning for vascular congestion or mild pulmonary edema. Electronically Signed   By: Markus Daft M.D.   On: 07/20/2021 09:57  ? ?CT Angio Chest PE W and/or Wo Contrast ? ?Result Date: 07/20/2021 ?CLINICAL DATA:  Weakness EXAM: CT ANGIOGRAPHY CHEST WITH CONTRAST TECHNIQUE: Multidetector CT imaging of the chest was performed using the standard protocol during bolus administration of intravenous contrast. Multiplanar CT image reconstructions and MIPs were obtained to evaluate the  vascular anatomy. RADIATION DOSE REDUCTION: This exam was performed according to the departmental dose-optimization program which includes automated exposure control, adjustment of the mA and/or kV according to patient size and/or use

## 2021-07-23 NOTE — TOC Transition Note (Addendum)
Transition of Care (TOC) - CM/SW Discharge Note ? ? ?Patient Details  ?Name: Gary [name] ?MRN: [id number] ?Date of Birth: 11-26-1955 ? ?Transition of Care (TOC) CM/SW Contact:  ?Alberteen Sam, LCSW ?Phone Number: ?07/23/2021, 9:54 AM ? ? ?Clinical Narrative:    ? ?Update: Per RN patient needs oxygen to go home, [place] with [employer] informed to get tank delivered to hospital room  ? ? ?Patient to discharge home today. Spoke with patient regarding recommendations, he reports he does not need home health services.  ? ?Patient has oxygen through [employer] at home, reports his portable O2 tank is not holding a charge well, CSW has informed Jasmine with [employer] who will follow up with scheduling a service call with patient to address the issue. ? ?Patient reports he has a ride at discharge today and is aware to inform ride to bring his home O2.  ? ?No further discharge needs.  ? ? ?Final next level of care: Home/Self Care ?Barriers to Discharge: No Barriers Identified ? ? ?Patient Goals and CMS Choice ?Patient states their goals for this hospitalization and ongoing recovery are:: to go home ?CMS Medicare.gov Compare Post Acute Care list provided to:: Patient ?Choice offered to / list presented to : Patient ? ?Discharge Placement ?  ?           ?  ?  ?  ?  ? ?Discharge Plan and Services ?  ?  ?           ?DME Arranged: Oxygen ?DME Agency: [employer] ?  ?  ?  ?  ?  ?  ?  ?  ? ?Social Determinants of Health (SDOH) Interventions ?  ? ? ?Readmission Risk Interventions ? ?  05/04/2021  ?  1:26 PM 04/07/2021  ?  1:39 PM 03/12/2021  ? 11:54 AM  ?Readmission Risk Prevention Plan  ?Transportation Screening Complete Complete Complete  ?PCP or Specialist Appt within 3-5 Days  Complete Complete  ?[place] or Home Care Consult   Complete  ?Social Work Consult for [place] Planning/Counseling   Complete  ?Palliative Care Screening  Not Applicable Not Applicable  ?Medication Review Press photographer) Complete Complete Complete  ?PCP or Specialist  appointment within 3-5 days of discharge Complete    ?[place] or Home Care Consult Complete    ?SW Recovery Care/Counseling Consult Complete    ?Palliative Care Screening Not Applicable    ?[place] Not Applicable    ? ? ? ? ? ?

## 2021-07-23 NOTE — Progress Notes (Signed)
PHARMACY CONSULT NOTE ? ?Pharmacy Consult for Electrolyte Monitoring and Replacement  ? ?Recent Labs: ?Potassium (mmol/L)  ?Date Value  ?07/22/2021 4.6  ?04/15/2014 3.9  ? ?Magnesium (mg/dL)  ?Date Value  ?07/22/2021 2.0  ?08/21/2012 1.9  ? ?Calcium (mg/dL)  ?Date Value  ?07/22/2021 8.0 (L)  ? ?Calcium, Total (mg/dL)  ?Date Value  ?04/15/2014 8.3 (L)  ? ?Albumin (g/dL)  ?Date Value  ?07/20/2021 3.3 (L)  ?04/15/2014 2.9 (L)  ? ?Phosphorus (mg/dL)  ?Date Value  ?07/22/2021 3.2  ? ?Sodium (mmol/L)  ?Date Value  ?07/22/2021 138  ?03/15/2020 145 (H)  ?04/15/2014 140  ? ? ?Assessment: 66 y.o. male with a past medical history of obesity, COPD, CHF, previous GI bleeds, HTN and OSA chronically on 2 L nasal cannula and CPAP at night who presents via EMS from home for evaluation of worsening shortness of breath. PCCM asked to admit to stepdown for BiPAP.  High risk for intubation. Pharmacy is asked to follow and replace electrolytes while in the CCU ? ?Diuresis: IV Lasix 80 mg x 1 on 4/7>> torsemide '20mg'$  daily ? ?Goal of Therapy:  ?Electrolytes within normal limits ? ?Plan:  ?No electrolyte replacement warranted at this time ?F/u electrolytes in AM ? ?Cj Edgell A ?07/23/2021 10:24 AM ? ?

## 2021-07-27 ENCOUNTER — Encounter (HOSPITAL_COMMUNITY): Payer: Self-pay

## 2021-07-27 NOTE — Progress Notes (Signed)
Attempted to contact, no answer.  Left a message for him to call me.  Also contacted [employer] a church in [place] about building him a ramp on his home.  He has difficulty getting in and out.  ? ?Sharryn Belding ?[place] EMT-Paramedic ?[phone] ?

## 2021-08-02 ENCOUNTER — Ambulatory Visit: Payer: Medicaid Other | Admitting: Family

## 2021-08-02 ENCOUNTER — Telehealth (HOSPITAL_COMMUNITY): Payer: Self-pay

## 2021-08-02 NOTE — Telephone Encounter (Signed)
Gary [name] contacted me this morning stated his phone has been messed up.  He stas had a PCP appt and went well.  She states last week gained a few lbs but back down now, he states its what he ate.  Sent in referral for [employer] for him.  Also advised him someone would be by to measure for a ramp and rails for him.  He had forgot about HF clinic appt today and had them to reschedule it for 2 weeks.  He states doing ok right now and will call and check on him next week.  He is aware to call with weight gain or any problems.   ? ?[name] ?[place] EMT-Paramedic ?[phone] ?

## 2021-08-07 ENCOUNTER — Inpatient Hospital Stay (HOSPITAL_COMMUNITY)
Admit: 2021-08-07 | Discharge: 2021-08-07 | Disposition: A | Discharge status: Home / Self Care | Payer: Medicaid Other | Attending: Internal Medicine | Admitting: Internal Medicine

## 2021-08-07 ENCOUNTER — Inpatient Hospital Stay: Payer: Medicaid Other

## 2021-08-07 ENCOUNTER — Emergency Department: Payer: Medicaid Other

## 2021-08-07 ENCOUNTER — Other Ambulatory Visit: Payer: Self-pay

## 2021-08-07 ENCOUNTER — Inpatient Hospital Stay
Admission: EM | Admit: 2021-08-07 | Discharge: 2021-08-15 | DRG: 291 | Disposition: A | Discharge status: Home / Self Care | Payer: Medicaid Other | Attending: Internal Medicine | Admitting: Internal Medicine

## 2021-08-07 ENCOUNTER — Inpatient Hospital Stay: Admit: 2021-08-07 | Payer: Medicaid Other

## 2021-08-07 DIAGNOSIS — E785 Hyperlipidemia, unspecified: Secondary | ICD-10-CM | POA: Diagnosis present

## 2021-08-07 DIAGNOSIS — D696 Thrombocytopenia, unspecified: Secondary | ICD-10-CM

## 2021-08-07 DIAGNOSIS — I272 Pulmonary hypertension, unspecified: Secondary | ICD-10-CM | POA: Diagnosis present

## 2021-08-07 DIAGNOSIS — J9621 Acute and chronic respiratory failure with hypoxia: Secondary | ICD-10-CM | POA: Diagnosis present

## 2021-08-07 DIAGNOSIS — Z823 Family history of stroke: Secondary | ICD-10-CM

## 2021-08-07 DIAGNOSIS — I5033 Acute on chronic diastolic (congestive) heart failure: Secondary | ICD-10-CM

## 2021-08-07 DIAGNOSIS — Z515 Encounter for palliative care: Secondary | ICD-10-CM

## 2021-08-07 DIAGNOSIS — Z833 Family history of diabetes mellitus: Secondary | ICD-10-CM

## 2021-08-07 DIAGNOSIS — G4733 Obstructive sleep apnea (adult) (pediatric): Secondary | ICD-10-CM

## 2021-08-07 DIAGNOSIS — I5031 Acute diastolic (congestive) heart failure: Secondary | ICD-10-CM | POA: Diagnosis not present

## 2021-08-07 DIAGNOSIS — E873 Alkalosis: Secondary | ICD-10-CM | POA: Diagnosis present

## 2021-08-07 DIAGNOSIS — Z20822 Contact with and (suspected) exposure to covid-19: Secondary | ICD-10-CM | POA: Diagnosis present

## 2021-08-07 DIAGNOSIS — Z87891 Personal history of nicotine dependence: Secondary | ICD-10-CM

## 2021-08-07 DIAGNOSIS — E8729 Other acidosis: Secondary | ICD-10-CM | POA: Diagnosis present

## 2021-08-07 DIAGNOSIS — D509 Iron deficiency anemia, unspecified: Secondary | ICD-10-CM

## 2021-08-07 DIAGNOSIS — Z8616 Personal history of COVID-19: Secondary | ICD-10-CM | POA: Diagnosis not present

## 2021-08-07 DIAGNOSIS — E662 Morbid (severe) obesity with alveolar hypoventilation: Secondary | ICD-10-CM | POA: Diagnosis present

## 2021-08-07 DIAGNOSIS — Z9049 Acquired absence of other specified parts of digestive tract: Secondary | ICD-10-CM

## 2021-08-07 DIAGNOSIS — G9341 Metabolic encephalopathy: Secondary | ICD-10-CM | POA: Diagnosis present

## 2021-08-07 DIAGNOSIS — J449 Chronic obstructive pulmonary disease, unspecified: Secondary | ICD-10-CM

## 2021-08-07 DIAGNOSIS — Z6841 Body Mass Index (BMI) 40.0 and over, adult: Secondary | ICD-10-CM | POA: Diagnosis not present

## 2021-08-07 DIAGNOSIS — I493 Ventricular premature depolarization: Secondary | ICD-10-CM | POA: Diagnosis present

## 2021-08-07 DIAGNOSIS — I739 Peripheral vascular disease, unspecified: Secondary | ICD-10-CM | POA: Diagnosis present

## 2021-08-07 DIAGNOSIS — Z7951 Long term (current) use of inhaled steroids: Secondary | ICD-10-CM

## 2021-08-07 DIAGNOSIS — I959 Hypotension, unspecified: Secondary | ICD-10-CM | POA: Diagnosis present

## 2021-08-07 DIAGNOSIS — Z79899 Other long term (current) drug therapy: Secondary | ICD-10-CM

## 2021-08-07 DIAGNOSIS — J810 Acute pulmonary edema: Secondary | ICD-10-CM | POA: Diagnosis present

## 2021-08-07 DIAGNOSIS — I10 Essential (primary) hypertension: Secondary | ICD-10-CM | POA: Diagnosis not present

## 2021-08-07 DIAGNOSIS — Z9981 Dependence on supplemental oxygen: Secondary | ICD-10-CM

## 2021-08-07 DIAGNOSIS — G928 Other toxic encephalopathy: Secondary | ICD-10-CM | POA: Diagnosis present

## 2021-08-07 DIAGNOSIS — Z7189 Other specified counseling: Secondary | ICD-10-CM | POA: Diagnosis not present

## 2021-08-07 DIAGNOSIS — I130 Hypertensive heart and chronic kidney disease with heart failure and stage 1 through stage 4 chronic kidney disease, or unspecified chronic kidney disease: Secondary | ICD-10-CM | POA: Diagnosis present

## 2021-08-07 DIAGNOSIS — Z91199 Patient's noncompliance with other medical treatment and regimen due to unspecified reason: Secondary | ICD-10-CM | POA: Diagnosis not present

## 2021-08-07 DIAGNOSIS — J9622 Acute and chronic respiratory failure with hypercapnia: Secondary | ICD-10-CM | POA: Diagnosis present

## 2021-08-07 DIAGNOSIS — N1831 Chronic kidney disease, stage 3a: Secondary | ICD-10-CM

## 2021-08-07 DIAGNOSIS — N179 Acute kidney failure, unspecified: Secondary | ICD-10-CM | POA: Diagnosis present

## 2021-08-07 DIAGNOSIS — Z7982 Long term (current) use of aspirin: Secondary | ICD-10-CM

## 2021-08-07 DIAGNOSIS — G4730 Sleep apnea, unspecified: Secondary | ICD-10-CM | POA: Diagnosis present

## 2021-08-07 DIAGNOSIS — Z8711 Personal history of peptic ulcer disease: Secondary | ICD-10-CM

## 2021-08-07 LAB — COMPREHENSIVE METABOLIC PANEL
ALT: 10 U/L (ref 0–44)
AST: 12 U/L — ABNORMAL LOW (ref 15–41)
Albumin: 3.3 g/dL — ABNORMAL LOW (ref 3.5–5.0)
Alkaline Phosphatase: 95 U/L (ref 38–126)
Anion gap: 6 (ref 5–15)
BUN: 26 mg/dL — ABNORMAL HIGH (ref 8–23)
CO2: 41 mmol/L — ABNORMAL HIGH (ref 22–32)
Calcium: 8.1 mg/dL — ABNORMAL LOW (ref 8.9–10.3)
Chloride: 96 mmol/L — ABNORMAL LOW (ref 98–111)
Creatinine, Ser: 1.39 mg/dL — ABNORMAL HIGH (ref 0.61–1.24)
GFR, Estimated: 57 mL/min — ABNORMAL LOW (ref >60)
Glucose, Bld: 114 mg/dL — ABNORMAL HIGH (ref 70–99)
Potassium: 3.8 mmol/L (ref 3.5–5.1)
Sodium: 143 mmol/L (ref 135–145)
Total Bilirubin: 0.4 mg/dL (ref 0.3–1.2)
Total Protein: 8.4 g/dL — ABNORMAL HIGH (ref 6.5–8.1)

## 2021-08-07 LAB — URINE DRUG SCREEN, QUALITATIVE (ARMC ONLY)
Amphetamines, Ur Screen: NOT DETECTED
Barbiturates, Ur Screen: NOT DETECTED
Benzodiazepine, Ur Scrn: NOT DETECTED
Cannabinoid 50 Ng, Ur ~~LOC~~: NOT DETECTED
Cocaine Metabolite,Ur ~~LOC~~: NOT DETECTED
MDMA (Ecstasy)Ur Screen: NOT DETECTED
Methadone Scn, Ur: NOT DETECTED
Opiate, Ur Screen: NOT DETECTED
Phencyclidine (PCP) Ur S: NOT DETECTED
Tricyclic, Ur Screen: NOT DETECTED

## 2021-08-07 LAB — BLOOD GAS, ARTERIAL
Acid-Base Excess: 20.7 mmol/L — ABNORMAL HIGH (ref 0.0–2.0)
Bicarbonate: 46.8 mmol/L — ABNORMAL HIGH (ref 20.0–28.0)
FIO2: 50 %
MECHVT: 690 mL
Mechanical Rate: 20
O2 Saturation: 92.4 %
PEEP: 5 cmH2O
Patient temperature: 37
RATE: 20 resp/min
pCO2 arterial: 56 mmHg — ABNORMAL HIGH (ref 32–48)
pH, Arterial: 7.53 — ABNORMAL HIGH (ref 7.35–7.45)
pO2, Arterial: 54 mmHg — ABNORMAL LOW (ref 83–108)

## 2021-08-07 LAB — TROPONIN I (HIGH SENSITIVITY): Troponin I (High Sensitivity): 7 ng/L (ref <18)

## 2021-08-07 LAB — GLUCOSE, CAPILLARY
Glucose-Capillary: 99 mg/dL (ref 70–99)
Glucose-Capillary: 111 mg/dL — ABNORMAL HIGH (ref 70–99)
Glucose-Capillary: 109 mg/dL — ABNORMAL HIGH (ref 70–99)

## 2021-08-07 LAB — ECHOCARDIOGRAM COMPLETE
AR max vel: 2.93 cm2
AV Area VTI: 2.91 cm2
AV Area mean vel: 2.82 cm2
AV Mean grad: 6 mmHg
AV Peak grad: 11.6 mmHg
Ao pk vel: 1.7 m/s
Area-P 1/2: 4.36 cm2
Height: 76 in
MV VTI: 4.02 cm2
S' Lateral: 4.15 cm
Weight: 6525.62 oz

## 2021-08-07 LAB — CBC WITH DIFFERENTIAL/PLATELET
Abs Immature Granulocytes: 0.05 10*3/uL (ref 0.00–0.07)
Basophils Absolute: 0 10*3/uL (ref 0.0–0.1)
Basophils Relative: 0 %
Eosinophils Absolute: 0.1 10*3/uL (ref 0.0–0.5)
Eosinophils Relative: 2 %
HCT: 46.9 % (ref 39.0–52.0)
Hemoglobin: 12.2 g/dL — ABNORMAL LOW (ref 13.0–17.0)
Immature Granulocytes: 1 %
Lymphocytes Relative: 16 %
Lymphs Abs: 1.2 10*3/uL (ref 0.7–4.0)
MCH: 23.5 pg — ABNORMAL LOW (ref 26.0–34.0)
MCHC: 26 g/dL — ABNORMAL LOW (ref 30.0–36.0)
MCV: 90.4 fL (ref 80.0–100.0)
Monocytes Absolute: 0.7 10*3/uL (ref 0.1–1.0)
Monocytes Relative: 9 %
Neutro Abs: 5.2 10*3/uL (ref 1.7–7.7)
Neutrophils Relative %: 72 %
Platelets: 186 10*3/uL (ref 150–400)
RBC: 5.19 MIL/uL (ref 4.22–5.81)
RDW: 16.9 % — ABNORMAL HIGH (ref 11.5–15.5)
Smear Review: NORMAL
WBC: 7.3 10*3/uL (ref 4.0–10.5)
nRBC: 4 % — ABNORMAL HIGH (ref 0.0–0.2)

## 2021-08-07 LAB — BRAIN NATRIURETIC PEPTIDE: B Natriuretic Peptide: 120.3 pg/mL — ABNORMAL HIGH (ref 0.0–100.0)

## 2021-08-07 LAB — RESP PANEL BY RT-PCR (FLU A&B, COVID) ARPGX2
Influenza A by PCR: NEGATIVE
Influenza B by PCR: NEGATIVE
SARS Coronavirus 2 by RT PCR: NEGATIVE

## 2021-08-07 LAB — MAGNESIUM: Magnesium: 2.4 mg/dL (ref 1.7–2.4)

## 2021-08-07 MED ORDER — NOREPINEPHRINE 4 MG/250ML-% IV SOLN
2.0000 ug/min | INTRAVENOUS | Status: DC
  Administered 2021-08-07: 2 ug/min via INTRAVENOUS
  Filled 2021-08-07: qty 250

## 2021-08-07 MED ORDER — ROCURONIUM BROMIDE 10 MG/ML (PF) SYRINGE
50.0000 mg | PREFILLED_SYRINGE | Freq: Once | INTRAVENOUS | Status: AC
  Administered 2021-08-07: 100 mg via INTRAVENOUS
  Filled 2021-08-07: qty 10

## 2021-08-07 MED ORDER — ONDANSETRON HCL 4 MG/2ML IJ SOLN: 4.0000 mg | Freq: Three times a day (TID) | INTRAMUSCULAR | Status: DC | PRN

## 2021-08-07 MED ORDER — ACETAMINOPHEN 325 MG PO TABS: 650.0000 mg | ORAL_TABLET | Freq: Four times a day (QID) | ORAL | Status: DC | PRN

## 2021-08-07 MED ORDER — STERILE WATER FOR INJECTION IJ SOLN
INTRAMUSCULAR | Status: AC
  Filled 2021-08-07: qty 10

## 2021-08-07 MED ORDER — ACETAZOLAMIDE SODIUM 500 MG IJ SOLR
500.0000 mg | Freq: Once | INTRAMUSCULAR | Status: AC
  Administered 2021-08-07: 500 mg via INTRAVENOUS
  Filled 2021-08-07: qty 500

## 2021-08-07 MED ORDER — PROPOFOL 1000 MG/100ML IV EMUL
0.0000 ug/kg/min | INTRAVENOUS | Status: DC
  Administered 2021-08-07 (×2): 40 ug/kg/min via INTRAVENOUS
  Administered 2021-08-07: 5 ug/kg/min via INTRAVENOUS
  Administered 2021-08-08 – 2021-08-09 (×15): 40 ug/kg/min via INTRAVENOUS
  Filled 2021-08-07 (×18): qty 100

## 2021-08-07 MED ORDER — ALBUTEROL SULFATE (2.5 MG/3ML) 0.083% IN NEBU: 3.0000 mL | INHALATION_SOLUTION | RESPIRATORY_TRACT | Status: DC | PRN

## 2021-08-07 MED ORDER — DOCUSATE SODIUM 50 MG/5ML PO LIQD
100.0000 mg | Freq: Two times a day (BID) | ORAL | Status: DC
  Administered 2021-08-07 – 2021-08-08 (×2): 100 mg
  Filled 2021-08-07 (×2): qty 10

## 2021-08-07 MED ORDER — LORAZEPAM 2 MG/ML IJ SOLN
2.0000 mg | INTRAMUSCULAR | Status: DC | PRN
  Administered 2021-08-09: 2 mg via INTRAVENOUS
  Filled 2021-08-07 (×2): qty 1

## 2021-08-07 MED ORDER — DM-GUAIFENESIN ER 30-600 MG PO TB12
1.0000 | ORAL_TABLET | Freq: Two times a day (BID) | ORAL | Status: DC | PRN
Start: 2021-08-07 — End: 2021-08-08

## 2021-08-07 MED ORDER — ETOMIDATE 2 MG/ML IV SOLN
20.0000 mg | Freq: Once | INTRAVENOUS | Status: AC
  Administered 2021-08-07: 20 mg via INTRAVENOUS
  Filled 2021-08-07: qty 10

## 2021-08-07 MED ORDER — BUDESONIDE 0.25 MG/2ML IN SUSP
0.2500 mg | Freq: Two times a day (BID) | RESPIRATORY_TRACT | Status: DC
  Administered 2021-08-07 – 2021-08-09 (×5): 0.25 mg via RESPIRATORY_TRACT
  Filled 2021-08-07 (×5): qty 2

## 2021-08-07 MED ORDER — ALBUTEROL SULFATE (2.5 MG/3ML) 0.083% IN NEBU
3.0000 mL | INHALATION_SOLUTION | RESPIRATORY_TRACT | Status: DC
Start: 2021-08-07 — End: 2021-08-07

## 2021-08-07 MED ORDER — ENOXAPARIN SODIUM 100 MG/ML IJ SOSY
0.5000 mg/kg | PREFILLED_SYRINGE | Freq: Every day | INTRAMUSCULAR | Status: DC
  Administered 2021-08-07 – 2021-08-11 (×5): 92.5 mg via SUBCUTANEOUS
  Filled 2021-08-07 (×5): qty 0.93

## 2021-08-07 MED ORDER — HYDRALAZINE HCL 20 MG/ML IJ SOLN: 5.0000 mg | INTRAMUSCULAR | Status: DC | PRN

## 2021-08-07 MED ORDER — FENTANYL 2500MCG IN NS 250ML (10MCG/ML) PREMIX INFUSION
50.0000 ug/h | INTRAVENOUS | Status: DC
  Administered 2021-08-07 (×2): 50 ug/h via INTRAVENOUS
  Administered 2021-08-08: 100 ug/h via INTRAVENOUS
  Filled 2021-08-07 (×2): qty 250

## 2021-08-07 MED ORDER — FUROSEMIDE 10 MG/ML IJ SOLN
80.0000 mg | Freq: Once | INTRAMUSCULAR | Status: AC
  Administered 2021-08-07: 80 mg via INTRAVENOUS
  Filled 2021-08-07: qty 8

## 2021-08-07 MED ORDER — SODIUM CHLORIDE 0.9 % IV BOLUS
250.0000 mL | Freq: Once | INTRAVENOUS | Status: AC
  Administered 2021-08-07: 250 mL via INTRAVENOUS

## 2021-08-07 MED ORDER — POTASSIUM CHLORIDE CRYS ER 20 MEQ PO TBCR
40.0000 meq | EXTENDED_RELEASE_TABLET | Freq: Once | ORAL | Status: AC
  Administered 2021-08-07: 40 meq via ORAL

## 2021-08-07 MED ORDER — FENTANYL CITRATE (PF) 100 MCG/2ML IJ SOLN
100.0000 ug | Freq: Once | INTRAMUSCULAR | Status: AC
  Administered 2021-08-07: 100 ug via INTRAVENOUS
  Filled 2021-08-07: qty 2

## 2021-08-07 MED ORDER — VECURONIUM BROMIDE 10 MG IV SOLR: 10.0000 mg | INTRAVENOUS | Status: DC | PRN

## 2021-08-07 MED ORDER — ALBUTEROL SULFATE (2.5 MG/3ML) 0.083% IN NEBU: 3.0000 mL | INHALATION_SOLUTION | RESPIRATORY_TRACT | Status: DC

## 2021-08-07 MED ORDER — METHYLPREDNISOLONE SODIUM SUCC 40 MG IJ SOLR
40.0000 mg | Freq: Every day | INTRAMUSCULAR | Status: DC
  Administered 2021-08-07 – 2021-08-10 (×4): 40 mg via INTRAVENOUS
  Filled 2021-08-07 (×4): qty 1

## 2021-08-07 MED ORDER — CARVEDILOL 6.25 MG PO TABS
6.2500 mg | ORAL_TABLET | Freq: Every day | ORAL | Status: DC
  Administered 2021-08-07: 6.25 mg via ORAL
  Filled 2021-08-07: qty 1

## 2021-08-07 MED ORDER — CHLORHEXIDINE GLUCONATE CLOTH 2 % EX PADS
6.0000 | MEDICATED_PAD | Freq: Every day | CUTANEOUS | Status: DC
  Administered 2021-08-08 – 2021-08-15 (×8): 6 via TOPICAL

## 2021-08-07 MED ORDER — FUROSEMIDE 10 MG/ML IJ SOLN: 80.0000 mg | Freq: Two times a day (BID) | INTRAMUSCULAR | Status: DC

## 2021-08-07 MED ORDER — NOREPINEPHRINE 16 MG/250ML-% IV SOLN
0.0000 ug/min | INTRAVENOUS | Status: DC
  Administered 2021-08-07: 4 ug/min via INTRAVENOUS
  Filled 2021-08-07 (×2): qty 250

## 2021-08-07 MED ORDER — FENTANYL BOLUS VIA INFUSION
50.0000 ug | INTRAVENOUS | Status: DC | PRN
  Administered 2021-08-07: 50 ug via INTRAVENOUS
  Filled 2021-08-07: qty 100

## 2021-08-07 MED ORDER — POLYETHYLENE GLYCOL 3350 17 G PO PACK
17.0000 g | PACK | Freq: Every day | ORAL | Status: DC
  Administered 2021-08-08: 17 g
  Filled 2021-08-07: qty 1

## 2021-08-07 MED ORDER — SODIUM CHLORIDE 0.9 % IV SOLN
250.0000 mL | INTRAVENOUS | Status: DC
Start: 2021-08-07 — End: 2021-08-15
  Administered 2021-08-07 (×2): 250 mL via INTRAVENOUS

## 2021-08-07 MED ORDER — PANTOPRAZOLE SODIUM 40 MG PO TBEC
40.0000 mg | DELAYED_RELEASE_TABLET | Freq: Every day | ORAL | Status: DC
  Administered 2021-08-07: 40 mg via ORAL
  Filled 2021-08-07 (×2): qty 1

## 2021-08-07 MED ORDER — IPRATROPIUM-ALBUTEROL 0.5-2.5 (3) MG/3ML IN SOLN
3.0000 mL | RESPIRATORY_TRACT | Status: DC
  Administered 2021-08-07 – 2021-08-10 (×18): 3 mL via RESPIRATORY_TRACT
  Filled 2021-08-07 (×18): qty 3

## 2021-08-07 MED ORDER — SIMVASTATIN 20 MG PO TABS: 10.0000 mg | ORAL_TABLET | Freq: Every day | ORAL | Status: DC

## 2021-08-07 MED ORDER — FERROUS SULFATE 325 (65 FE) MG PO TABS
325.0000 mg | ORAL_TABLET | Freq: Two times a day (BID) | ORAL | Status: DC
  Filled 2021-08-07: qty 1

## 2021-08-07 MED ORDER — ASPIRIN EC 81 MG PO TBEC
81.0000 mg | DELAYED_RELEASE_TABLET | Freq: Every day | ORAL | Status: DC
  Administered 2021-08-07: 81 mg via ORAL
  Filled 2021-08-07: qty 1
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
## 2021-08-07 NOTE — Assessment & Plan Note (Addendum)
Patient has chronic intermittent thrombocytopenia.  Last platelet count 148. ?
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
## 2021-08-07 NOTE — Assessment & Plan Note (Addendum)
BMI 48.95 ? ?

## 2021-08-07 NOTE — Assessment & Plan Note (Deleted)
Patient's shortness breath is likely due to CHF exacerbation.  2D echo 02/21/2021 showed EF of 60 to 65% with grade 3 diastolic dysfunction.  Patient has 2+ leg edema, pulmonary vascular congestion on chest x-ray, consistent with CHF exacerbation.  His BNP slightly elevated 128, which is likely falsely low due to morbid obesity.  Transitional care team to check to see if he has a noninvasive ventilation machine at home.  Patient's friend thinks that he has a CPAP at home. ? ?-Will admit to SDU as inpatient ?-Lasix 80 mg bid by IV ?-2d echo ?-Daily weights ?-strict I/O's ?-Low salt diet ?-Fluid restriction ?-Obtain REDs Vest reading ?-start CPAP now. ?

## 2021-08-07 NOTE — Assessment & Plan Note (Addendum)
Continue zocor 

## 2021-08-07 NOTE — ED Triage Notes (Signed)
Ankles, also sob, general weakness  ?

## 2021-08-07 NOTE — Assessment & Plan Note (Addendum)
Continue nebulizers Pulmicort and DuoNeb.  Patient on low-dose Solu-Medrol ?

## 2021-08-07 NOTE — Progress Notes (Signed)
1300: Patient arrived to ICU 3 via stretcher with RN. When asked patient if he could assist transferring from stretcher to bed he states he can not because he "lost all of his power." Patient had incontinent episode, arriving to icu with clothing saturated in urine; patient requesting urinal. Assistance to use urinal provided, patient would like to use external male catheter because "I got that furosemide." Soiled clothing removed from patient and placed in belongings bag. Attempt to give patient CHG bath upon arrival and patient refused. Education provided to patient re importance of hygiene and risk for infection, patient continues to refuse CHG bath as he "feels cold like it is [place]."   ? ?1640: Spoke with patient s/o [name], per [name] patient information only to be given to family left on chart (sisters: Neoma Laming and [name]) and [name]. No one else to receive information on patient status.  ? ?9371: ABG results reviewed by ICU team, patient to be intubated. MD discussed with patient sister and obtained consent.  ? ?6967: Patient intubated by ICU team without complication, positive color change and audible bilateral breath sounds. OG tube placed by ICU NP.  ? ?8938: L IJ CVC placement by ICU team.  ? ? ?1830: CXR confirming line placement complete, per ICU NP ok to use CVC.  Patient external catheter leaked saturating bed with urine. Linen change, chg bath complete.  ?

## 2021-08-07 NOTE — Procedures (Signed)
Central Venous Catheter Insertion Procedure Note ?TAILOR WESTFALL ?[id number] ?1955-12-24 ? ?Procedure: Insertion of Central Venous Catheter ?Indications: Assessment of intravascular volume, Drug and/or fluid administration, and Frequent blood sampling ? ?Procedure Details ?Consent: Risks of procedure as well as the alternatives and risks of each were explained to the (patient/caregiver).  Consent for procedure obtained. ?Time Out: Verified patient identification, verified procedure, site/side was marked, verified correct patient position, special equipment/implants available, medications/allergies/relevent history reviewed, required imaging and test results available.  Performed ? ?Maximum sterile technique was used including antiseptics, cap, gloves, gown, hand hygiene, mask, and sheet. ?Skin prep: Chlorhexidine; local anesthetic administered ?A antimicrobial bonded/coated triple lumen catheter was placed in the left internal jugular vein using the Seldinger technique. ? ?Evaluation ?Blood flow good ?Complications: No apparent complications ?Patient did tolerate procedure well. ?Chest X-ray ordered to verify placement.  CXR: pending. ? ? ? ?Line secured at the 20 cm mark. ?BIOPATCH applied to the insertion site. ? ? ?Darel Hong, AGACNP-BC ?[facility] ?Prefer epic messenger for cross cover needs ?If after hours, please call E-link ? ? ?Bradly Bienenstock ?08/07/2021, 6:22 PM ? ? ?

## 2021-08-07 NOTE — ED Notes (Signed)
Informed RN bed assigned 
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
## 2021-08-07 NOTE — Assessment & Plan Note (Addendum)
On 08/07/2021 PCO2 was 123.  The patient was intubated during the hospital stay and extubated.  Currently was on BiPAP this morning secondary to lethargy.  We will check an ABG again to see if still CO2 retention.  Lethargy may be secondary to the Seroquel at night.  Transitional care team to check to see if patient has a noninvasive ventilation machine at home.  Patient's friend thinks that he has a CPAP at home. ?

## 2021-08-07 NOTE — ED Notes (Signed)
Pt refused cpap until hge finished eating called rrt whenm finished and rrt aware.  ?

## 2021-08-07 NOTE — Procedures (Signed)
Endotracheal Intubation: ?Patient required placement of an artificial airway secondary to unresponsive state ?  ?Consent: Emergent.  ?  ?Hand washing performed prior to starting the procedure.  ?  ?Medications administered for sedation prior to procedure:  ?Fentanyl 100 mcg IV, 20 mg Etomidate IV, Rocuronium 50 mg IV ?  ?  ?A time out procedure was called and correct patient, name, & ID confirmed. Needed supplies and equipment were assembled and checked to include ETT, 10 ml syringe, Glidescope, Mac and Miller blades, suction, oxygen and bag mask valve, end tidal CO2 monitor.  ?  ?Patient was positioned to align the mouth and pharynx to facilitate visualization of the glottis.  ?  ?Heart rate, SpO2 and blood pressure was continuously monitored during the procedure. Pre-oxygenation was conducted prior to intubation and endotracheal tube was placed through the vocal cords into the trachea.  ?  ?  ? The artificial airway was placed under direct visualization via glidescope route using a 8.5 ETT on the first attempt. ?  ?ETT was secured at 25 cm at the lip. ?  ?Placement was confirmed by auscuitation of lungs with good breath sounds bilaterally and no stomach sounds.  Condensation was noted on endotracheal tube.   ?Pulse ox 100%. CO2 detector in place with appropriate color change.  ?  ?Complications: None .  ?  ?  ?  ?Chest radiograph ordered and pending.  ? ? ?Darel Hong, AGACNP-BC ?[facility] ?Prefer epic messenger for cross cover needs ?If after hours, please call E-link ? ? ?

## 2021-08-07 NOTE — Assessment & Plan Note (Addendum)
Coreg on hold with hypotension.  Try to restart oral torsemide today ?

## 2021-08-07 NOTE — Assessment & Plan Note (Addendum)
If he has a CPAP at home this will have to be switched over to noninvasive ventilation ?

## 2021-08-07 NOTE — Assessment & Plan Note (Addendum)
Last hemoglobin 11.9 ?

## 2021-08-07 NOTE — Assessment & Plan Note (Addendum)
Creatinine 1.48 today.  GFR 53. ?

## 2021-08-07 NOTE — Assessment & Plan Note (Addendum)
Patient lethargic this a.m. answers a few questions and went back to sleep.  Not sure if it secondary to the Seroquel or CO2 retention.  Will obtain an ABG. ?

## 2021-08-08 ENCOUNTER — Inpatient Hospital Stay: Payer: Medicaid Other

## 2021-08-08 DIAGNOSIS — I5033 Acute on chronic diastolic (congestive) heart failure: Secondary | ICD-10-CM | POA: Diagnosis not present

## 2021-08-08 LAB — BASIC METABOLIC PANEL
Anion gap: 7 (ref 5–15)
BUN: 25 mg/dL — ABNORMAL HIGH (ref 8–23)
CO2: 34 mmol/L — ABNORMAL HIGH (ref 22–32)
Calcium: 8.2 mg/dL — ABNORMAL LOW (ref 8.9–10.3)
Chloride: 99 mmol/L (ref 98–111)
Creatinine, Ser: 1.4 mg/dL — ABNORMAL HIGH (ref 0.61–1.24)
GFR, Estimated: 56 mL/min — ABNORMAL LOW (ref >60)
Glucose, Bld: 102 mg/dL — ABNORMAL HIGH (ref 70–99)
Potassium: 3.7 mmol/L (ref 3.5–5.1)
Sodium: 140 mmol/L (ref 135–145)

## 2021-08-08 LAB — BLOOD GAS, ARTERIAL
Acid-Base Excess: 18.9 mmol/L — ABNORMAL HIGH (ref 0.0–2.0)
Acid-Base Excess: 12.1 mmol/L — ABNORMAL HIGH (ref 0.0–2.0)
Bicarbonate: 51.5 mmol/L — ABNORMAL HIGH (ref 20.0–28.0)
Bicarbonate: 38.7 mmol/L — ABNORMAL HIGH (ref 20.0–28.0)
Delivery systems: POSITIVE
Expiratory PAP: 8 cmH2O
FIO2: 45 %
FIO2: 50 %
Inspiratory PAP: 20 cmH2O
MECHVT: 600 mL
O2 Saturation: 92.1 %
O2 Saturation: 95.2 %
PEEP: 5 cmH2O
Patient temperature: 37
Patient temperature: 37
RATE: 15 resp/min
pCO2 arterial: 123 mmHg (ref 32–48)
pCO2 arterial: 57 mmHg — ABNORMAL HIGH (ref 32–48)
pH, Arterial: 7.23 — ABNORMAL LOW (ref 7.35–7.45)
pH, Arterial: 7.44 (ref 7.35–7.45)
pO2, Arterial: 64 mmHg — ABNORMAL LOW (ref 83–108)
pO2, Arterial: 65 mmHg — ABNORMAL LOW (ref 83–108)

## 2021-08-08 LAB — CBC
HCT: 42.8 % (ref 39.0–52.0)
Hemoglobin: 11.4 g/dL — ABNORMAL LOW (ref 13.0–17.0)
MCH: 23 pg — ABNORMAL LOW (ref 26.0–34.0)
MCHC: 26.6 g/dL — ABNORMAL LOW (ref 30.0–36.0)
MCV: 86.3 fL (ref 80.0–100.0)
Platelets: 185 10*3/uL (ref 150–400)
RBC: 4.96 MIL/uL (ref 4.22–5.81)
RDW: 16.5 % — ABNORMAL HIGH (ref 11.5–15.5)
WBC: 8.9 10*3/uL (ref 4.0–10.5)
nRBC: 2.2 % — ABNORMAL HIGH (ref 0.0–0.2)

## 2021-08-08 LAB — PHOSPHORUS: Phosphorus: 1.7 mg/dL — ABNORMAL LOW (ref 2.5–4.6)

## 2021-08-08 LAB — GLUCOSE, CAPILLARY
Glucose-Capillary: 91 mg/dL (ref 70–99)
Glucose-Capillary: 93 mg/dL (ref 70–99)
Glucose-Capillary: 116 mg/dL — ABNORMAL HIGH (ref 70–99)
Glucose-Capillary: 136 mg/dL — ABNORMAL HIGH (ref 70–99)
Glucose-Capillary: 126 mg/dL — ABNORMAL HIGH (ref 70–99)

## 2021-08-08 LAB — BLOOD GAS, VENOUS
Acid-Base Excess: 16.8 mmol/L — ABNORMAL HIGH (ref 0.0–2.0)
Bicarbonate: 49.4 mmol/L — ABNORMAL HIGH (ref 20.0–28.0)
Patient temperature: 37
pCO2, Ven: 110 mmHg (ref 44–60)
pH, Ven: 7.26 (ref 7.25–7.43)
pO2, Ven: 65 mmHg — ABNORMAL HIGH (ref 32–45)

## 2021-08-08 LAB — MAGNESIUM: Magnesium: 2.1 mg/dL (ref 1.7–2.4)

## 2021-08-08 LAB — TRIGLYCERIDES: Triglycerides: 135 mg/dL (ref <150)

## 2021-08-08 MED ORDER — ORAL CARE MOUTH RINSE
15.0000 mL | OROMUCOSAL | Status: DC
  Administered 2021-08-08 – 2021-08-09 (×12): 15 mL via OROMUCOSAL

## 2021-08-08 MED ORDER — POTASSIUM CHLORIDE 20 MEQ PO PACK
20.0000 meq | PACK | Freq: Once | ORAL | Status: AC
  Administered 2021-08-08: 20 meq
  Filled 2021-08-08: qty 1

## 2021-08-08 MED ORDER — ADULT MULTIVITAMIN LIQUID CH
15.0000 mL | Freq: Every day | ORAL | Status: DC
Start: 2021-08-09 — End: 2021-08-10
  Administered 2021-08-10: 15 mL
  Filled 2021-08-08 (×2): qty 15

## 2021-08-08 MED ORDER — ACETAZOLAMIDE 250 MG PO TABS
500.0000 mg | ORAL_TABLET | Freq: Once | ORAL | Status: AC
  Administered 2021-08-08: 500 mg
  Filled 2021-08-08: qty 2

## 2021-08-08 MED ORDER — CHLORHEXIDINE GLUCONATE 0.12% ORAL RINSE (MEDLINE KIT)
15.0000 mL | Freq: Two times a day (BID) | OROMUCOSAL | Status: DC
  Administered 2021-08-08 – 2021-08-09 (×3): 15 mL via OROMUCOSAL

## 2021-08-08 MED ORDER — POTASSIUM PHOSPHATES 15 MMOLE/5ML IV SOLN
30.0000 mmol | Freq: Once | INTRAVENOUS | Status: AC
  Administered 2021-08-08: 30 mmol via INTRAVENOUS
  Filled 2021-08-08: qty 10

## 2021-08-08 MED ORDER — POTASSIUM PHOSPHATES 15 MMOLE/5ML IV SOLN
30.0000 mmol | Freq: Once | INTRAVENOUS | Status: DC
  Filled 2021-08-08: qty 10

## 2021-08-08 MED ORDER — SENNOSIDES-DOCUSATE SODIUM 8.6-50 MG PO TABS
1.0000 | ORAL_TABLET | Freq: Every day | ORAL | Status: DC
  Administered 2021-08-10: 1
  Filled 2021-08-08: qty 1

## 2021-08-08 MED ORDER — POTASSIUM CHLORIDE 20 MEQ PO PACK
40.0000 meq | PACK | Freq: Once | ORAL | Status: DC
Start: 2021-08-08 — End: 2021-08-08

## 2021-08-08 MED ORDER — POTASSIUM CHLORIDE 20 MEQ PO PACK
40.0000 meq | PACK | Freq: Once | ORAL | Status: AC
  Administered 2021-08-08: 40 meq
  Filled 2021-08-08: qty 2

## 2021-08-08 MED ORDER — SIMVASTATIN 20 MG PO TABS
10.0000 mg | ORAL_TABLET | Freq: Every day | ORAL | Status: DC
  Administered 2021-08-08 – 2021-08-10 (×3): 10 mg
  Filled 2021-08-08 (×3): qty 1

## 2021-08-08 MED ORDER — PANTOPRAZOLE 2 MG/ML SUSPENSION
40.0000 mg | Freq: Every day | ORAL | Status: DC
  Administered 2021-08-08 – 2021-08-10 (×2): 40 mg
  Filled 2021-08-08 (×2): qty 20

## 2021-08-08 MED ORDER — ACETAMINOPHEN 325 MG PO TABS
650.0000 mg | ORAL_TABLET | Freq: Four times a day (QID) | ORAL | Status: DC | PRN
  Administered 2021-08-11 – 2021-08-15 (×3): 650 mg
  Filled 2021-08-08 (×3): qty 2

## 2021-08-08 MED ORDER — FREE WATER
30.0000 mL | Status: DC
  Administered 2021-08-08 – 2021-08-09 (×5): 30 mL

## 2021-08-08 MED ORDER — ASPIRIN 81 MG PO CHEW
81.0000 mg | CHEWABLE_TABLET | Freq: Every day | ORAL | Status: DC
  Administered 2021-08-08 – 2021-08-10 (×2): 81 mg
  Filled 2021-08-08 (×3): qty 1

## 2021-08-08 MED ORDER — VITAL HIGH PROTEIN PO LIQD
1000.0000 mL | ORAL | Status: DC
  Administered 2021-08-08: 1000 mL

## 2021-08-08 MED ORDER — PROSOURCE TF PO LIQD
90.0000 mL | Freq: Every day | ORAL | Status: DC
  Administered 2021-08-08 – 2021-08-09 (×3): 90 mL
  Filled 2021-08-08 (×8): qty 90

## 2021-08-08 MED ORDER — CARVEDILOL 6.25 MG PO TABS
6.2500 mg | ORAL_TABLET | Freq: Every day | ORAL | Status: DC
  Filled 2021-08-08: qty 1
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
## 2021-08-08 NOTE — Progress Notes (Signed)
Initial Nutrition Assessment ? ?DOCUMENTATION CODES:  ? ?Morbid obesity ? ?INTERVENTION:  ? ?Initiate Vital HP '@25ml'$ /hr + ProSource TF 73m 6 times daily ? ?Free water flushes 341mq4 hours to maintain tube patency  ? ?Propofol: 44.4 ml/hr- provides 1172kcal/day  ? ?Regimen provides 2252kcal/day, 185g/day protein and 68261may of free water  ? ?Liquid MVI daily via tube  ? ?NUTRITION DIAGNOSIS:  ? ?Inadequate oral intake related to inability to eat (pt sedated and ventilated) as evidenced by NPO status. ? ?GOAL:  ? ?Provide needs based on ASPEN/SCCM guidelines ? ?MONITOR:  ? ?Vent status, Labs, Weight trends, TF tolerance, Skin, I & O's ? ?REASON FOR ASSESSMENT:  ? ?Consult ?Enteral/tube feeding initiation and management ? ?ASSESSMENT:  ? ?[age]o male with h/o PUD, OSA, HTN, CKD III, substance abuse, CHF, Hep C and COPD and COVID 19 (02/2021) who is admitted with acute decompensated CHF. ? ?Pt sedated and ventilated. OGT in place; plan is to start tube feeds today. Per chart, pt appears weight stable at baseline.  ? ?Medications reviewed and include: aspirin, lovenox, solu-medrol, protonix, miralax, Kcl, senokot, levphed, Kphos, propofol  ? ?Labs reviewed: K 3.7 wnl, BUN 25(H), creat 1.40(H), P 1.7(L), Mg 2.1 wnl ?Cbgs- 116, 93, 91 x 24 hrs ? ?Patient is currently intubated on ventilator support ?MV: 9.4 L/min ?Temp (24hrs), Avg:98.8 ?F (37.1 ?C), Min:98.1 ?F (36.7 ?C), Max:99.3 ?F (37.4 ?C) ? ?Propofol: 44.4 ml/hr- provides 1172kcal/day  ? ?MAP- >25m68m ? ?UOP- 2900ml79mNUTRITION - FOCUSED PHYSICAL EXAM: ? ?Flowsheet Row Most Recent Value  ?Orbital Region No depletion  ?Upper Arm Region No depletion  ?Thoracic and Lumbar Region No depletion  ?Buccal Region No depletion  ?Temple Region No depletion  ?Clavicle Bone Region No depletion  ?Clavicle and Acromion Bone Region No depletion  ?Scapular Bone Region No depletion  ?Dorsal Hand No depletion  ?Patellar Region No depletion  ?Anterior Thigh Region No depletion   ?Posterior Calf Region No depletion  ?Edema (RD Assessment) Moderate  ?Hair Reviewed  ?Eyes Reviewed  ?Mouth Reviewed  ?Skin Reviewed  ?Nails Reviewed  ? ?Diet Order:   ?Diet Order   ? ?       ?  Diet NPO time specified  Diet effective now       ?  ? ?  ?  ? ?  ? ?EDUCATION NEEDS:  ? ?No education needs have been identified at this time ? ?Skin:  Skin Assessment: Reviewed RN Assessment ? ?Last BM:  4/23 ? ?Height:  ? ?Ht Readings from Last 1 Encounters:  ?08/07/21 '6\' 4"'$  (1.93 m)  ? ? ?Weight:  ? ?Wt Readings from Last 1 Encounters:  ?08/08/21 (!) 185.4 kg  ? ? ?Ideal Body Weight:  91.8 kg ? ?BMI:  Body mass index is 49.75 kg/m?. ? ?Estimated Nutritional Needs:  ? ?Kcal:  2020-2295kcal/day ? ?Protein:  >200-230g/day ? ?Fluid:  2.0L/day ? ?CaseyKoleen DistanceRD, LDN ?Please refer to [facility] for RD and/or RD on-call/weekend/after hours pager ? ?

## 2021-08-08 NOTE — Consult Note (Addendum)
PHARMACY CONSULT NOTE ? ?Pharmacy Consult for Electrolyte Monitoring and Replacement  ? ?Recent Labs: ?Potassium (mmol/L)  ?Date Value  ?08/08/2021 3.7  ?04/15/2014 3.9  ? ?Magnesium (mg/dL)  ?Date Value  ?08/08/2021 2.1  ?08/21/2012 1.9  ? ?Calcium (mg/dL)  ?Date Value  ?08/08/2021 8.2 (L)  ? ?Calcium, Total (mg/dL)  ?Date Value  ?04/15/2014 8.3 (L)  ? ?Albumin (g/dL)  ?Date Value  ?08/07/2021 3.3 (L)  ?04/15/2014 2.9 (L)  ? ?Phosphorus (mg/dL)  ?Date Value  ?08/08/2021 1.7 (L)  ? ?Sodium (mmol/L)  ?Date Value  ?08/08/2021 140  ?03/15/2020 145 (H)  ?04/15/2014 140  ? ?4/25: Corrected Calcium: 8.8 mg/dL ? ?Assessment: ?66 yo M, with medical history of diastolic CHF (EF 60 - 09%, grade III DD, 11/22), COPD on home O2 2L, HTN, OSA on CPAP, PVD, HLD, former smoker, morbidly obese (BMI 49.65), GI bleeding, CKD 3, Iron deficiency anemia, presented to the ED on 4/24 for SOB and admitted due to acute exacerbation on chronic diastolic CHF with  leg edema and pulmonary vascular congestion on X-ray. ? ?Was given furosemide 80 mg IV x2 on 4/24. ? ?Scheduled for potassium phosphate 30 mmol in D5 55m infusion on 4/25 1000. (Approximately 44 mEq of potassium) ? ?Received acetazolamide 500 mg x 1 on 4/25 0900. ? ?Potassium is low, at 3.7 mmol/L at 4/25 0438. ?Phosphorus is low, at 1.7 mg/dL at 4/25 0438. ? ? ?Goal of Therapy:  ?K 4.0 - 5.0 ?Mg >2.0 ?Other electrolytes WNL ? ?Plan:  ?Give 40 mEq Kcl Per Tube AM and 20 mEq Kcl Per Tube PM. ? ?F/u with AM labs. ? ?QRebbeca Revere,PharmD Candidate [place] 2025 ?08/08/2021 7:32 AM  ?

## 2021-08-08 NOTE — TOC Initial Note (Signed)
Transition of Care (TOC) - Initial/Assessment Note  ? ? ?Patient Details  ?Name: Gary [name] ?MRN: [id number] ?Date of Birth: [birth year] ? ?Transition of Care (TOC) CM/SW Contact:    ?Shelbie Hutching, RN ?Phone Number: ?08/08/2021, 2:45 PM ? ?Clinical Narrative:                 ?Patient admitted to the hospital with acute on chronic diastolic CHF.  Patient is from home, he is on chronic oxygen at 2 L from [employer].  He is a current patient in the HF clinic with Darylene Price, he also gets Paramedicine visits from [facility] at home.  Patient had previously been driving himself to appointments, he does have a girlfriend and 5 sisters. ?Patient is currently in the ICU intubated and sedated.   ? ?TOC will follow for needs, patient may need a trach.  ? ?Expected Discharge Plan: Home/Self Care ?Barriers to Discharge: Continued Medical Work up ? ? ?Patient Goals and CMS Choice ?Patient states their goals for this hospitalization and ongoing recovery are:: unable to voice goals intubated ?  ?  ? ?Expected Discharge Plan and Services ?Expected Discharge Plan: Home/Self Care ?  ?Discharge Planning Services: CM Consult ?  ?Living arrangements for the past 2 months: [place] ?                ?  ?  ?  ?  ?  ?HH Arranged: NA ?[place] Agency: NA ?  ?  ?  ? ?Prior Living Arrangements/Services ?Living arrangements for the past 2 months: [place] ?  ?Patient language and need for interpreter reviewed:: Yes ?       ?Need for Family Participation in Patient Care: Yes (Comment) ?Care giver support system in place?: Yes (comment) ?Current home services: DME (oxygen, walker, cane, cpap) ?Criminal Activity/Legal Involvement Pertinent to Current Situation/Hospitalization: No - Comment as needed ? ?Activities of Daily Living ?Home Assistive Devices/Equipment: CPAP ?  ? ?Permission Sought/Granted ?  ?Permission granted to share information with : No ?   ?   ?   ?   ? ?Emotional Assessment ?Appearance:: Appears older than  stated age ?Attitude/Demeanor/Rapport: Intubated (Following Commands or Not Following Commands) ?Affect (typically observed): Unable to Assess ?  ?Alcohol / Substance Use: Not Applicable ?Psych Involvement: No (comment) ? ?Admission diagnosis:  Acute pulmonary edema ([place]) [J81.0] ?Acute on chronic diastolic CHF (congestive heart failure) ([place]) [I50.33] ?Acute on chronic respiratory failure with hypoxia ([name]) [J96.21] ?Acute on chronic respiratory failure with hypoxia and hypercapnia ([name]) [J96.21, J96.22] ?Patient Active Problem List  ? Diagnosis Date Noted  ? Acute on chronic respiratory failure with hypercapnia ([place]) 08/07/2021  ? COPD (chronic obstructive pulmonary disease) ([place])   ? HLD (hyperlipidemia)   ? Iron deficiency anemia   ? Chronic kidney disease, stage 3a ([place]) 07/22/2021  ? Acute cardiac pulmonary edema ([place]) 07/22/2021  ? Acute on chronic respiratory failure with hypoxia and hypercapnia ([place]) 07/20/2021  ? Chronic obstructive pulmonary disease ([name])   ? Positive D dimer   ? CHF exacerbation ([place]) 05/03/2021  ? Acute CHF (congestive heart failure) ([place]) 04/04/2021  ? Weakness   ? Pneumonia 03/11/2021  ? Acute respiratory failure with hypoxia and hypercapnia ([place]) 03/11/2021  ? (HFpEF) heart failure with preserved ejection fraction ([place])   ? Chronic respiratory failure with hypoxia ([name])   ? Hyperlipidemia 02/19/2021  ? Acute respiratory distress syndrome (ARDS) due to COVID-19 virus [facility])   ? Acute on chronic respiratory  failure ([place]) 02/18/2021  ? Spondylosis without myelopathy or radiculopathy, lumbosacral region 11/15/2020  ? Lumbosacral facet joint syndrome (Bilateral) 10/31/2020  ? Generalized osteoarthritis of multiple sites 10/31/2020  ? AKI (acute kidney injury) ([place]) 03/04/2020  ? Hyperkalemia 03/04/2020  ? COPD with acute exacerbation ([place]) 03/02/2020  ? Marijuana use 01/17/2020  ? Elevated BUN 01/17/2020  ? Hypocalcemia 01/17/2020  ? Hypochloremia 01/17/2020  ? Low thyroid stimulating  hormone (TSH) level 01/17/2020  ? Vitamin D deficiency 01/17/2020  ? Elevated sed rate 01/17/2020  ? Thrombocytopenia ([place]) 01/17/2020  ? Elevated hematocrit 01/17/2020  ? Elevated brain natriuretic peptide (BNP) level 01/17/2020  ? Positive hepatitis C antibody test 01/17/2020  ? 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 mass index (BMI) of 50.0 to 59.9 in adult [facility]) 01/17/2020  ? Osteoarthritis of glenohumeral joints (Bilateral) 01/17/2020  ? Osteoarthritis of AC (acromioclavicular) joints (Bilateral) 01/17/2020  ? Lumbar Grade 1 Anterolisthesis of L4 over L5 (3 mm) (stable) 01/17/2020  ? Osteoarthritis of lumbar spine (L3 through S1) 01/17/2020  ? Lumbar facet osteoarthritis (Multilevel) (Bilateral) 01/17/2020  ? Osteoarthritis of hips (Bilateral) 01/17/2020  ? Osteoarthritis of knees (Bilateral) 01/17/2020  ? Tricompartment osteoarthritis of knees (Bilateral) 01/17/2020  ? Patellofemoral arthralgia of knees (Bilateral) 01/17/2020  ? Plantar and Achilles calcaneal spurs (Bilateral) 01/17/2020  ? Ankle edema (Bilateral) 01/17/2020  ? Traumatic osteoarthritis of hands (Bilateral) 01/17/2020  ? Polysubstance abuse ([place]) 08/25/2019  ? Acute metabolic encephalopathy 51/88/4166  ? Chronic pain syndrome 07/14/2019  ? Pharmacologic therapy 07/14/2019  ? Disorder of skeletal system 07/14/2019  ? Problems influencing health status 07/14/2019  ? Chronic low back pain (1ry area of Pain) (Bilateral) (L>R) w/o sciatica 07/14/2019  ? Chronic shoulder pain (2ry area of Pain) (Bilateral) (L>R) 07/14/2019  ? Chronic feet pain (3ry area of Pain) (Bilateral) 07/14/2019  ? Chronic knee pain (4th area of Pain) (Bilateral) (L>R) 07/14/2019  ? Chronic hip pain (5th area of Pain) (Bilateral) (L>R) 07/14/2019  ? Chronic hand pain (6th area of Pain) (Bilateral) (L>R) 07/14/2019  ? Lumbar facet arthropathy 07/14/2019  ? DDD (degenerative disc disease), lumbosacral 07/14/2019  ? Closed fracture of shaft of humerus, sequela  (Left) 07/14/2019  ? DDD (degenerative disc disease), cervical 07/14/2019  ? Tobacco use 05/04/2017  ? Lymphedema 05/04/2017  ? CHF (congestive heart failure) ([place]) 04/12/2017  ? Acute on chronic diastolic CHF (congestive heart failure) ([place]) 02/28/2015  ? Obstructive sleep apnea 02/28/2015  ? Acute on chronic diastolic congestive heart failure ([place])   ? Acute on chronic respiratory failure with hypoxia ([name])   ? Edema of both feet   ? Closed fracture of scapula, sequela (Left) 04/01/2014  ? Obesity, Class III, BMI 40-49.9 (morbid obesity) ([place]) 04/01/2014  ? HTN (hypertension) 04/01/2014  ? Multiple rib fractures 03/27/2014  ? ?PCP:  [facility] ?Pharmacy:   ?[name] ?[place] [address] ?Phone: [phone] Fax: [phone] ? ? ? ? ?Social Determinants of Health (SDOH) Interventions ?  ? ?Readmission Risk Interventions ? ?  05/04/2021  ?  1:26 PM 04/07/2021  ?  1:39 PM 03/12/2021  ? 11:54 AM  ?Readmission Risk Prevention Plan  ?Transportation Screening Complete Complete Complete  ?PCP or Specialist Appt within 3-5 Days  Complete Complete  ?[place] or Home Care Consult   Complete  ?Social Work Consult for [place] Planning/Counseling   Complete  ?Palliative Care Screening  Not Applicable Not Applicable  ?Medication  Review Press photographer) Complete Complete Complete  ?PCP or Specialist appointment within 3-5 days of discharge Complete    ?[place] or Home Care Consult Complete    ?SW Recovery Care/Counseling Consult Complete    ?Palliative Care Screening Not Applicable    ?[place] Not Applicable    ? ? ? ?

## 2021-08-08 NOTE — Progress Notes (Signed)
Discussed benefits of tracheostomy placement regarding Gary [name] with pts sister Gary [name] via telephone.  The pt has 5 sisters who are his decision makers, and he also has a significant other Gary [name] who resides with him.  Gary [name] states his siblings concerns are they are unsure if the pt would want tracheostomy placement.  They would feel better if the pt could be a part of the decision making.  Discussed with Gary [name] pts sister and she stated they DO NOT want to proceed with tracheostomy placement and would like pt to make that decision for himself.   ? ?[name], AGNP  ?Pulmonary/Critical Care ?Pager [phone] (please enter 7 digits) ?PCCM Consult Pager [phone] (please enter 7 digits)  ?

## 2021-08-09 DIAGNOSIS — G9341 Metabolic encephalopathy: Secondary | ICD-10-CM | POA: Diagnosis not present

## 2021-08-09 DIAGNOSIS — I5033 Acute on chronic diastolic (congestive) heart failure: Secondary | ICD-10-CM | POA: Diagnosis not present

## 2021-08-09 DIAGNOSIS — J9622 Acute and chronic respiratory failure with hypercapnia: Secondary | ICD-10-CM | POA: Diagnosis not present

## 2021-08-09 LAB — GLUCOSE, CAPILLARY
Glucose-Capillary: 100 mg/dL — ABNORMAL HIGH (ref 70–99)
Glucose-Capillary: 104 mg/dL — ABNORMAL HIGH (ref 70–99)
Glucose-Capillary: 106 mg/dL — ABNORMAL HIGH (ref 70–99)
Glucose-Capillary: 107 mg/dL — ABNORMAL HIGH (ref 70–99)
Glucose-Capillary: 149 mg/dL — ABNORMAL HIGH (ref 70–99)
Glucose-Capillary: 87 mg/dL (ref 70–99)

## 2021-08-09 LAB — CBC
HCT: 43 % (ref 39.0–52.0)
Hemoglobin: 11.8 g/dL — ABNORMAL LOW (ref 13.0–17.0)
MCH: 23 pg — ABNORMAL LOW (ref 26.0–34.0)
MCHC: 27.4 g/dL — ABNORMAL LOW (ref 30.0–36.0)
MCV: 84 fL (ref 80.0–100.0)
Platelets: 180 10*3/uL (ref 150–400)
RBC: 5.12 MIL/uL (ref 4.22–5.81)
RDW: 17.2 % — ABNORMAL HIGH (ref 11.5–15.5)
WBC: 7.2 10*3/uL (ref 4.0–10.5)
nRBC: 1.4 % — ABNORMAL HIGH (ref 0.0–0.2)

## 2021-08-09 LAB — BASIC METABOLIC PANEL
Anion gap: 6 (ref 5–15)
BUN: 24 mg/dL — ABNORMAL HIGH (ref 8–23)
CO2: 30 mmol/L (ref 22–32)
Calcium: 8.6 mg/dL — ABNORMAL LOW (ref 8.9–10.3)
Chloride: 105 mmol/L (ref 98–111)
Creatinine, Ser: 1.32 mg/dL — ABNORMAL HIGH (ref 0.61–1.24)
GFR, Estimated: >60 mL/min (ref >60)
Glucose, Bld: 100 mg/dL — ABNORMAL HIGH (ref 70–99)
Potassium: 3.9 mmol/L (ref 3.5–5.1)
Sodium: 141 mmol/L (ref 135–145)

## 2021-08-09 LAB — MAGNESIUM: Magnesium: 2.2 mg/dL (ref 1.7–2.4)

## 2021-08-09 LAB — PHOSPHORUS: Phosphorus: 3.8 mg/dL (ref 2.5–4.6)

## 2021-08-09 MED ORDER — POTASSIUM CHLORIDE 20 MEQ PO PACK: 40.0000 meq | PACK | Freq: Once | ORAL | Status: DC

## 2021-08-09 MED ORDER — ORAL CARE MOUTH RINSE
15.0000 mL | Freq: Two times a day (BID) | OROMUCOSAL | Status: DC
  Administered 2021-08-10 – 2021-08-15 (×8): 15 mL via OROMUCOSAL

## 2021-08-09 MED ORDER — BUDESONIDE 0.25 MG/2ML IN SUSP
0.2500 mg | Freq: Two times a day (BID) | RESPIRATORY_TRACT | Status: DC
  Administered 2021-08-09 – 2021-08-15 (×12): 0.25 mg via RESPIRATORY_TRACT
  Filled 2021-08-09 (×12): qty 2

## 2021-08-09 MED ORDER — ACETAZOLAMIDE SODIUM 500 MG IJ SOLR
250.0000 mg | Freq: Once | INTRAMUSCULAR | Status: AC
  Administered 2021-08-09: 250 mg via INTRAVENOUS
  Filled 2021-08-09: qty 250

## 2021-08-09 NOTE — Plan of Care (Signed)
GOC consult noted. No family at bedside. Will reattempt at a later time.  ?

## 2021-08-09 NOTE — Progress Notes (Signed)
Called Gary [name] to notify her that Mr. Meroney was extubated this morning. Per Hilda [name] she will contact the rest of the family. Continue to assess.  ?

## 2021-08-09 NOTE — Progress Notes (Signed)
Nutrition Follow Up Note  ? ?DOCUMENTATION CODES:  ? ?Morbid obesity ? ?INTERVENTION:  ? ?RD will add supplements once pt's diet is advanced.  ? ?NUTRITION DIAGNOSIS:  ? ?Inadequate oral intake related to inability to eat (pt sedated and ventilated) as evidenced by NPO status. ? ?GOAL:  ? ?Patient will meet greater than or equal to 90% of their needs ?-not met  ? ?MONITOR:  ? ?Diet advancement, Labs, Weight trends, Skin, I & O's ? ?ASSESSMENT:  ? ?66 y/o male with h/o PUD, OSA, HTN, CKD III, substance abuse, CHF, Hep C and COPD and COVID 19 (02/2021) who is admitted with acute decompensated CHF. ? ?Pt extubated this morning; pt is at high risk for re-intubation per MD. Pt remains NPO. RD will add supplements once pt's diet is advanced. Per chart, pt is weight stable since admission; pt -2.4L on his I & Os.  ? ?Medications reviewed and include: aspirin, lovenox, solu-medrol, protonix, miralax, KCl, senokot ? ?Labs reviewed: K 3.9 wnl, BUN 24(H), creat 1.32(H), P 3.8 wnl, Mg 2.2 wnl ?Cbgs- 107, 100, 106, 126, 136, 116 x 24 hrs ? ?Diet Order:   ?Diet Order   ? ?       ?  Diet NPO time specified  Diet effective now       ?  ? ?  ?  ? ?  ? ?EDUCATION NEEDS:  ? ?No education needs have been identified at this time ? ?Skin:  Skin Assessment: Reviewed RN Assessment ? ?Last BM:  4/23 ? ?Height:  ? ?Ht Readings from Last 1 Encounters:  ?08/07/21 '6\' 4"'  (1.93 m)  ? ? ?Weight:  ? ?Wt Readings from Last 1 Encounters:  ?08/09/21 (!) 186.6 kg  ? ? ?Ideal Body Weight:  91.8 kg ? ?BMI:  Body mass index is 50.07 kg/m?. ? ?Estimated Nutritional Needs:  ? ?Kcal:  3000-3300kcal/day ? ?Protein:  >150g/day ? ?Fluid:  2.0L/day ? ?Koleen Distance MS, RD, LDN ?Please refer to AMION for RD and/or RD on-call/weekend/after hours pager ? ?

## 2021-08-09 NOTE — Consult Note (Signed)
PHARMACY CONSULT NOTE - FOLLOW UP ? ?Pharmacy Consult for Electrolyte Monitoring and Replacement  ? ?Recent Labs: ?Potassium (mmol/L)  ?Date Value  ?08/09/2021 3.9  ?04/15/2014 3.9  ? ?Magnesium (mg/dL)  ?Date Value  ?08/09/2021 2.2  ?08/21/2012 1.9  ? ?Calcium (mg/dL)  ?Date Value  ?08/09/2021 8.6 (L)  ? ?Calcium, Total (mg/dL)  ?Date Value  ?04/15/2014 8.3 (L)  ? ?Albumin (g/dL)  ?Date Value  ?08/07/2021 3.3 (L)  ?04/15/2014 2.9 (L)  ? ?Phosphorus (mg/dL)  ?Date Value  ?08/09/2021 3.8  ? ?Sodium (mmol/L)  ?Date Value  ?08/09/2021 141  ?03/15/2020 145 (H)  ?04/15/2014 140  ? ?4/26: Corrected Calcium: 9.2 mg/dL ? ?Assessment: ?66 yo M, with medical history of diastolic CHF (EF 60 - 97%, grade III DD, 11/22), COPD on home O2 2L, HTN, OSA on CPAP, PVD, HLD, former smoker, morbidly obese (BMI 49.65), GI bleeding, CKD 3, Iron deficiency anemia, presented to the ED on 4/24 for SOB and admitted due to acute exacerbation on chronic diastolic CHF with  leg edema and pulmonary vascular congestion on X-ray. ? ?On feeding tube 25 mL/hr ?Free water 30 mL Q4h. ? ?Received acetazolamide 250 mg IV at 4/26 1030. ?  ?Potassium is slightly low, at 3.9 mmol/L at 4/25 0417. ? ?Goal of Therapy:  ?K 4.0 - 5.0 mmol/L ?Mg 2.0 - 2.4 mg/dL ?Other electrolytes WNL ? ?Plan:  ?Potassium borderline at goal and patient is NPO. No replacement ordered today. Recheck in AM. ? ?F/u with AM labs. ? ?Gary [name] , PharmD Candidate [place] 2025 ?08/09/2021 7:22 AM  ?
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
## 2021-08-09 NOTE — Progress Notes (Signed)
Patient being very rude to staff, stating that we are tricking him and lying to him about the time of day after telling him the cafeteria is closed. Patient is unhappy with the food selection we have offered overnight despite this RN providing him with two sandwich trays, crackers, and peanut butter. He is attempting to get out of bed and is very difficult to redirect. He has refused his CBG checks and became angry with this RN after attempting to administer Ativan for agitation, stating that I am "NOT to put anything in him". Charge nurse in to speak with the patient. He is very noncompliant with all efforts to provide safe nursing care.  ? ?Cameron Ali, RN ?
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
## 2021-08-09 NOTE — Procedures (Signed)
Extubation Procedure Note ? ?Patient Details:   ?Name: Gary [name] ?DOB: 08/24/1955 ?MRN: [id number] ?  ?Airway Documentation:  ?  ?Vent end date: 08/09/21 Vent end time: 0845  ? ?Evaluation ? O2 sats: stable throughout ?Complications: No apparent complications ?Patient did tolerate procedure well. ?Bilateral Breath Sounds: Clear ?  ?Yes, able to cough and speak.  Placed on 4LPM via [place] (2L baseline per pt).   ? ?Gary [name] ?08/09/2021, 8:49 AM ? ?

## 2021-08-10 DIAGNOSIS — J9622 Acute and chronic respiratory failure with hypercapnia: Secondary | ICD-10-CM | POA: Diagnosis not present

## 2021-08-10 DIAGNOSIS — I5033 Acute on chronic diastolic (congestive) heart failure: Secondary | ICD-10-CM | POA: Diagnosis not present

## 2021-08-10 DIAGNOSIS — Z7189 Other specified counseling: Secondary | ICD-10-CM | POA: Diagnosis not present

## 2021-08-10 LAB — BASIC METABOLIC PANEL
Anion gap: 3 — ABNORMAL LOW (ref 5–15)
BUN: 37 mg/dL — ABNORMAL HIGH (ref 8–23)
CO2: 29 mmol/L (ref 22–32)
Calcium: 8.2 mg/dL — ABNORMAL LOW (ref 8.9–10.3)
Chloride: 107 mmol/L (ref 98–111)
Creatinine, Ser: 1.71 mg/dL — ABNORMAL HIGH (ref 0.61–1.24)
GFR, Estimated: 44 mL/min — ABNORMAL LOW (ref >60)
Glucose, Bld: 99 mg/dL (ref 70–99)
Potassium: 4.3 mmol/L (ref 3.5–5.1)
Sodium: 139 mmol/L (ref 135–145)

## 2021-08-10 LAB — CBC
HCT: 44.3 % (ref 39.0–52.0)
Hemoglobin: 11.9 g/dL — ABNORMAL LOW (ref 13.0–17.0)
MCH: 23.1 pg — ABNORMAL LOW (ref 26.0–34.0)
MCHC: 26.9 g/dL — ABNORMAL LOW (ref 30.0–36.0)
MCV: 86 fL (ref 80.0–100.0)
Platelets: 176 10*3/uL (ref 150–400)
RBC: 5.15 MIL/uL (ref 4.22–5.81)
RDW: 17.3 % — ABNORMAL HIGH (ref 11.5–15.5)
WBC: 6.1 10*3/uL (ref 4.0–10.5)
nRBC: 1 % — ABNORMAL HIGH (ref 0.0–0.2)

## 2021-08-10 LAB — GLUCOSE, CAPILLARY
Glucose-Capillary: 107 mg/dL — ABNORMAL HIGH (ref 70–99)
Glucose-Capillary: 107 mg/dL — ABNORMAL HIGH (ref 70–99)
Glucose-Capillary: 109 mg/dL — ABNORMAL HIGH (ref 70–99)
Glucose-Capillary: 140 mg/dL — ABNORMAL HIGH (ref 70–99)
Glucose-Capillary: 153 mg/dL — ABNORMAL HIGH (ref 70–99)
Glucose-Capillary: 95 mg/dL (ref 70–99)

## 2021-08-10 LAB — PHOSPHORUS: Phosphorus: 4.9 mg/dL — ABNORMAL HIGH (ref 2.5–4.6)

## 2021-08-10 LAB — MAGNESIUM: Magnesium: 2.4 mg/dL (ref 1.7–2.4)

## 2021-08-10 MED ORDER — METHYLPREDNISOLONE SODIUM SUCC 40 MG IJ SOLR
20.0000 mg | Freq: Every day | INTRAMUSCULAR | Status: DC
  Administered 2021-08-11 – 2021-08-13 (×3): 20 mg via INTRAVENOUS
  Filled 2021-08-10 (×3): qty 1

## 2021-08-10 MED ORDER — ENSURE MAX PROTEIN PO LIQD
11.0000 [oz_av] | Freq: Three times a day (TID) | ORAL | Status: DC
  Administered 2021-08-10 – 2021-08-15 (×9): 11 [oz_av] via ORAL
  Filled 2021-08-10: qty 330

## 2021-08-10 MED ORDER — ADULT MULTIVITAMIN W/MINERALS CH
1.0000 | ORAL_TABLET | Freq: Every day | ORAL | Status: DC
  Administered 2021-08-12 – 2021-08-15 (×4): 1 via ORAL
  Filled 2021-08-10 (×5): qty 1

## 2021-08-10 MED ORDER — QUETIAPINE FUMARATE 25 MG PO TABS
25.0000 mg | ORAL_TABLET | Freq: Every day | ORAL | Status: AC
  Administered 2021-08-10: 25 mg via ORAL
  Filled 2021-08-10: qty 1

## 2021-08-10 MED ORDER — FENTANYL CITRATE (PF) 100 MCG/2ML IJ SOLN
INTRAMUSCULAR | Status: AC
  Filled 2021-08-10: qty 2

## 2021-08-10 MED ORDER — IPRATROPIUM-ALBUTEROL 0.5-2.5 (3) MG/3ML IN SOLN
RESPIRATORY_TRACT | Status: AC
  Administered 2021-08-10: 3 mL
  Filled 2021-08-10: qty 3

## 2021-08-10 MED ORDER — IPRATROPIUM-ALBUTEROL 0.5-2.5 (3) MG/3ML IN SOLN
3.0000 mL | Freq: Three times a day (TID) | RESPIRATORY_TRACT | Status: DC
  Administered 2021-08-10 – 2021-08-15 (×14): 3 mL via RESPIRATORY_TRACT
  Filled 2021-08-10 (×15): qty 3
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
## 2021-08-10 NOTE — Progress Notes (Signed)
PT Cancellation Note ? ?Patient Details ?Name: Gary [name] ?MRN: [id number] ?DOB: 1956/01/15 ? ? ?Cancelled Treatment:    Reason Eval/Treat Not Completed: Other (comment). Consult received and chart reviewed. Pt well known to therapy team. Evaluation attempted. Pt allowed MMT, however refuses any mobility at this time. No sensation and grossly movement of all extremities against gravity. Reports all home set up is same as last admission (hospital bed, RW, SPC,) and has been ambulating household distances using RW. Reports he plans to get railing added to the 1 step to enter the home. Has been active with HHPT previously, however reports he doesn't need therapy anymore since he is at his baseline level. When asked if he wanted therapy during hospitalization, her replied he didn't see any benefit. Agreed to dc off caseload at this time and mobilize with RN staff when pt agreeable. Please re-order if pt changes mind and wants continued therapy during hospital stay. ? ? ?Braeleigh Pyper ?08/10/2021, 9:34 AM ?Greggory Stallion, PT, DPT, GCS ?[phone] ? ?
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
## 2021-08-10 NOTE — Progress Notes (Addendum)
1200 Patient screaming at nurses because he wants his urethral catheter out. Catheter tubing wrapped around patients leg. Patient cursing and threatening nurses. Patient stood up beside the bed and threated nurse Patient demanding foley removal. Patient screamed "you don't have balls so you don't understand how bad this hurts." After unraveling the catheter tubing, blood clots were noted in the  foley tubing. Explained to patient due to his urinary retention we could not remove foley. Patient  stated he would pull catheter out. Explained that would also be more painful and since he already had clots in the foley tubing and foley removal was not an option. Patient calmer and apologized to nurses. Dr. Mortimer Fries informed of event. Urethral catheter would remain. Patient RUDE AND HATEFULL ALL AFTERNOON. REFUSED TO RING CALL BED. YELLED AND CURSED AT NURSES. UNABLE TO CONSOLE PATIENT. 1800 Patient now demanding to talk with doctors.States he has had the two worse days that he can remember.  MDs notified of request to see them. Yelled because he lost his remote to the TV. Speech has remained slurred all day. Patient has been rude all day. ?

## 2021-08-10 NOTE — Progress Notes (Signed)
Nutrition Follow Up Note  ? ?DOCUMENTATION CODES:  ? ?Morbid obesity ? ?INTERVENTION:  ? ?Ensure Max protein supplement TID, each supplement provides 150kcal and 30g of protein. ? ?MVI po daily  ? ?Liberalize diet  ? ?NUTRITION DIAGNOSIS:  ? ?Inadequate oral intake related to inability to eat (pt sedated and ventilated) as evidenced by NPO status. ?-resolved ? ?GOAL:  ? ?Patient will meet greater than or equal to 90% of their needs ?-progressing  ? ?MONITOR:  ? ?PO intake, Supplement acceptance, Labs, Weight trends, Skin, I & O's ? ?ASSESSMENT:  ? ?66 y/o male with h/o PUD, OSA, HTN, CKD III, substance abuse, CHF, Hep C and COPD and COVID 19 (02/2021) who is admitted with acute decompensated CHF. ? ?Pt unavailable at time of RD visit this morning. Pt seen by SLP and initiated on a heart healthy diet. Spoke with RN, pt ate 100% of his breakfast this morning. RD will add supplements and MVI to help pt meet his estimated needs. RD will also liberalize the heart healthy portion of pt's diet as this is restrictive of protein. Per chart, pt is down 5lbs since admit but appears to be back at his UBW; pt -4.2L on his I & Os.   ? ?Medications reviewed and include: aspirin, lovenox, solu-medrol, MVI, protonix, miralax, senokot ? ?Labs reviewed: K 4.3 wnl, BUN 37(H), creat 1.71(H), P 4.9(L), Mg 2.4 wnl ?Cbgs- 153, 95, 107 x 24 hrs ? ?Diet Order:   ?Diet Order   ? ?       ?  Diet 2 gram sodium Room service appropriate? Yes; Fluid consistency: Thin  Diet effective now       ?  ? ?  ?  ? ?  ? ?EDUCATION NEEDS:  ? ?No education needs have been identified at this time ? ?Skin:  Skin Assessment: Reviewed RN Assessment ? ?Last BM:  4/27- TYPE 7 ? ?Height:  ? ?Ht Readings from Last 1 Encounters:  ?08/07/21 '6\' 4"'$  (1.93 m)  ? ? ?Weight:  ? ?Wt Readings from Last 1 Encounters:  ?08/10/21 (!) 182.4 kg  ? ? ?Ideal Body Weight:  91.8 kg ? ?BMI:  Body mass index is 48.95 kg/m?. ? ?Estimated Nutritional Needs:  ? ?Kcal:   3000-3300kcal/day ? ?Protein:  >150g/day ? ?Fluid:  2.0L/day ? ?Koleen Distance MS, RD, LDN ?Please refer to [facility] for RD and/or RD on-call/weekend/after hours pager ? ?

## 2021-08-10 NOTE — Consult Note (Addendum)
? ?  Heart Failure Nurse Navigator  Note ? ?HFpEF 3 to 65% mild left ventricular hypertrophy.  Grade 1 diastolic dysfunction. ? ?Presented to the emergency room with complaints of worsening shortness of breath.  He had not used his CPAP nor taken his medicines for 2 days due to not feeling well. ? ?Comorbidities: ? ?Morbid obesity ?COPD on home O2 ?Hypertension ?Obstructive sleep apnea on CPAP ?Peripheral vascular disease ?Hyperlipidemia ?Former smoker ? ?Medications: ? ?Aspirin 81 mg daily ?Simvastatin 10 mg daily ? ?Labs: ? ?Sodium 139, potassium 4.3, chloride 107, CO2 29, BUN 37, creatinine 1.17, GFR 44, anion gap 3, magnesium 2.4, phosphorus 4.9 ?Weight 182.4 kg ?Pressure 122/78 ?Intake 274 mL ?Output 1250 mL ? ? ?Met with patient in the ICU, he was lying supine in bed with O2 on per nasal cannula in no acute distress. ? ?Discussed how he takes care of himself at home.  Stressed the importance of being compliant with his daily medications, CPAP ,and daily weights. ? ?Voices that he feels that he has so many medications that he gets confused what he is taking.  Recommended using of weekly pillbox and having his girlfriend helping with setting up his medications.  Also suggested when he gets discharged from the hospital to sit down with his discharge medication list and all of his bottles make sure he is taking the appropriate medications.  Also stressed that he brings his medication bottles with him to his appointments in the heart failure clinic. ? ?He has an appointment on May 4 at 11:30 AM with the heart failure clinic.  He has a 22% no-show, which is 26 out of 116 appointments. ? ?Pricilla Riffle RN [facility]FN ?

## 2021-08-10 NOTE — Progress Notes (Signed)
This pt scored a 8 on the RT protocol assessment. The nebulizer treatments are to be changed to TID. ?

## 2021-08-10 NOTE — Consult Note (Signed)
PHARMACY CONSULT NOTE - FOLLOW UP ? ?Pharmacy Consult for Electrolyte Monitoring and Replacement  ? ?Recent Labs: ?Potassium (mmol/L)  ?Date Value  ?08/10/2021 4.3  ?04/15/2014 3.9  ? ?Magnesium (mg/dL)  ?Date Value  ?08/10/2021 2.4  ?08/21/2012 1.9  ? ?Calcium (mg/dL)  ?Date Value  ?08/10/2021 8.2 (L)  ? ?Calcium, Total (mg/dL)  ?Date Value  ?04/15/2014 8.3 (L)  ? ?Albumin (g/dL)  ?Date Value  ?08/07/2021 3.3 (L)  ?04/15/2014 2.9 (L)  ? ?Phosphorus (mg/dL)  ?Date Value  ?08/10/2021 4.9 (H)  ? ?Sodium (mmol/L)  ?Date Value  ?08/10/2021 139  ?03/15/2020 145 (H)  ?04/15/2014 140  ? ?4/27: Corrected calcium 8.8 mg/dL ? ?Assessment: ?66 yo M, with medical history of diastolic CHF (EF 60 - 44%, grade III DD, 11/22), COPD on home O2 2L, HTN, OSA on CPAP, PVD, HLD, former smoker, morbidly obese (BMI 49.65), GI bleeding, CKD 3, Iron deficiency anemia, presented to the ED on 4/24 for SOB and admitted due to acute exacerbation on chronic diastolic CHF with  leg edema and pulmonary vascular congestion on X-ray. ?  ?Off feeding tube since 4/26 1434 ? ?On NaCl 0.9% infusion '@10'$ -20 mL/hr ?  ?Potassium is within range, at 4.3 mmol/L at 4/27 0400. ? ?Goal of Therapy:  ?K 4.0 - 5.1 mmol/L ?Mg 2.0 - 2.4 mg/dL ?Other electrolytes WNL ?  ? ?Plan:  ?No replacement needed at the moment. ?  ?F/u with AM labs. ? ?Rebbeca Sequan ,PharmD Candidate [employer]25 ?08/10/2021 8:00 AM  ?

## 2021-08-11 DIAGNOSIS — J9622 Acute and chronic respiratory failure with hypercapnia: Secondary | ICD-10-CM | POA: Diagnosis not present

## 2021-08-11 DIAGNOSIS — G9341 Metabolic encephalopathy: Secondary | ICD-10-CM | POA: Diagnosis not present

## 2021-08-11 DIAGNOSIS — J449 Chronic obstructive pulmonary disease, unspecified: Secondary | ICD-10-CM | POA: Diagnosis not present

## 2021-08-11 DIAGNOSIS — I5033 Acute on chronic diastolic (congestive) heart failure: Secondary | ICD-10-CM | POA: Diagnosis not present

## 2021-08-11 LAB — CBC
HCT: 44.6 % (ref 39.0–52.0)
Hemoglobin: 11.9 g/dL — ABNORMAL LOW (ref 13.0–17.0)
MCH: 23.3 pg — ABNORMAL LOW (ref 26.0–34.0)
MCHC: 26.7 g/dL — ABNORMAL LOW (ref 30.0–36.0)
MCV: 87.5 fL (ref 80.0–100.0)
Platelets: 148 K/uL — ABNORMAL LOW (ref 150–400)
RBC: 5.1 MIL/uL (ref 4.22–5.81)
RDW: 17.1 % — ABNORMAL HIGH (ref 11.5–15.5)
WBC: 5.3 K/uL (ref 4.0–10.5)
nRBC: 1.1 % — ABNORMAL HIGH (ref 0.0–0.2)

## 2021-08-11 LAB — BASIC METABOLIC PANEL WITH GFR
Anion gap: 4 — ABNORMAL LOW (ref 5–15)
BUN: 38 mg/dL — ABNORMAL HIGH (ref 8–23)
CO2: 28 mmol/L (ref 22–32)
Calcium: 8.2 mg/dL — ABNORMAL LOW (ref 8.9–10.3)
Chloride: 106 mmol/L (ref 98–111)
Creatinine, Ser: 1.48 mg/dL — ABNORMAL HIGH (ref 0.61–1.24)
GFR, Estimated: 53 mL/min — ABNORMAL LOW
Glucose, Bld: 99 mg/dL (ref 70–99)
Potassium: 4.2 mmol/L (ref 3.5–5.1)
Sodium: 138 mmol/L (ref 135–145)

## 2021-08-11 LAB — BLOOD GAS, VENOUS
Acid-base deficit: 1.6 mmol/L (ref 0.0–2.0)
Bicarbonate: 31.2 mmol/L — ABNORMAL HIGH (ref 20.0–28.0)
Delivery systems: POSITIVE
FIO2: 0.4 %
O2 Saturation: 88.3 %
Patient temperature: 37
pCO2, Ven: 103 mmHg (ref 44–60)
pH, Ven: 7.09 — CL (ref 7.25–7.43)
pO2, Ven: 57 mmHg — ABNORMAL HIGH (ref 32–45)

## 2021-08-11 LAB — GLUCOSE, CAPILLARY
Glucose-Capillary: 102 mg/dL — ABNORMAL HIGH (ref 70–99)
Glucose-Capillary: 106 mg/dL — ABNORMAL HIGH (ref 70–99)
Glucose-Capillary: 134 mg/dL — ABNORMAL HIGH (ref 70–99)
Glucose-Capillary: 136 mg/dL — ABNORMAL HIGH (ref 70–99)
Glucose-Capillary: 82 mg/dL (ref 70–99)

## 2021-08-11 LAB — PHOSPHORUS: Phosphorus: 3.1 mg/dL (ref 2.5–4.6)

## 2021-08-11 LAB — MAGNESIUM: Magnesium: 2.6 mg/dL — ABNORMAL HIGH (ref 1.7–2.4)

## 2021-08-11 MED ORDER — PANTOPRAZOLE SODIUM 40 MG PO TBEC
40.0000 mg | DELAYED_RELEASE_TABLET | Freq: Every day | ORAL | Status: DC
  Administered 2021-08-12 – 2021-08-15 (×4): 40 mg via ORAL
  Filled 2021-08-11 (×4): qty 1

## 2021-08-11 MED ORDER — ETOMIDATE 2 MG/ML IV SOLN: 20.0000 mg | Freq: Once | INTRAVENOUS | Status: DC

## 2021-08-11 MED ORDER — SIMVASTATIN 20 MG PO TABS
10.0000 mg | ORAL_TABLET | Freq: Every day | ORAL | Status: DC
  Administered 2021-08-12 – 2021-08-14 (×3): 10 mg via ORAL
  Filled 2021-08-11 (×3): qty 1

## 2021-08-11 MED ORDER — POLYETHYLENE GLYCOL 3350 17 G PO PACK
17.0000 g | PACK | Freq: Every day | ORAL | Status: DC
  Administered 2021-08-14: 17 g via ORAL
  Filled 2021-08-11 (×3): qty 1

## 2021-08-11 MED ORDER — ASPIRIN 81 MG PO CHEW
81.0000 mg | CHEWABLE_TABLET | Freq: Every day | ORAL | Status: DC
  Administered 2021-08-12 – 2021-08-15 (×4): 81 mg via ORAL
  Filled 2021-08-11 (×4): qty 1

## 2021-08-11 MED ORDER — FENTANYL CITRATE (PF) 100 MCG/2ML IJ SOLN: 100.0000 ug | Freq: Once | INTRAMUSCULAR | Status: DC

## 2021-08-11 MED ORDER — TORSEMIDE 20 MG PO TABS
20.0000 mg | ORAL_TABLET | Freq: Every day | ORAL | Status: DC
  Administered 2021-08-12 – 2021-08-15 (×4): 20 mg via ORAL
  Filled 2021-08-11 (×4): qty 1

## 2021-08-11 MED ORDER — SENNOSIDES-DOCUSATE SODIUM 8.6-50 MG PO TABS
1.0000 | ORAL_TABLET | Freq: Every day | ORAL | Status: DC
  Administered 2021-08-13 – 2021-08-14 (×2): 1 via ORAL
  Filled 2021-08-11 (×3): qty 1

## 2021-08-11 MED ORDER — ROCURONIUM BROMIDE 50 MG/5ML IV SOLN
50.0000 mg | Freq: Once | INTRAVENOUS | Status: DC
Start: 2021-08-11 — End: 2021-08-11

## 2021-08-11 MED ORDER — FINASTERIDE 5 MG PO TABS
5.0000 mg | ORAL_TABLET | Freq: Every day | ORAL | Status: DC
  Administered 2021-08-12 – 2021-08-15 (×4): 5 mg via ORAL
  Filled 2021-08-11 (×4): qty 1

## 2021-08-11 MED ORDER — PANTOPRAZOLE 2 MG/ML SUSPENSION: 40.0000 mg | Freq: Every day | ORAL | Status: DC

## 2021-08-11 NOTE — Assessment & Plan Note (Signed)
Medications for heart failure were held secondary to low blood pressure.  Restart low-dose torsemide.  Holding other medications currently.  Last EF 60 to 65%. ?

## 2021-08-11 NOTE — IPAL (Signed)
?  Interdisciplinary Goals of Care Family Meeting ? ? ?Date carried out: 08/11/2021 ? ?Location of the meeting: Phone conference ? ?Member's involved: Physician and Family Member or next of kin ? ? ?Code status: Full Code ? ?Disposition: Continue current acute care ? ? ?GOALS OF CARE DISCUSSION ? ?The Clinical status was relayed to family in detail- ? ?Updated and notified of patients medical condition- ?Patient remains unresponsive and will not open eyes to command.   ?Patient is having a weak cough and struggling to remove secretions.   ?Patient with increased WOB and using accessory muscles to breathe ?Explained to family course of therapy and the modalities  ? ?Patient with Progressive multiorgan failure with a very high probablity of a very minimal chance of meaningful recovery despite all aggressive and optimal medical therapy.  ? ?PATIENT REMAINS FULL CODE ? ?Family understands the situation. ? ?Family are satisfied with Plan of action and management. All questions answered ? ?Additional CC time 35 mins ? ? ?[name] Parker, M.D.  ?[facility]  ?Medical Director [place] ?Medical Director [facility] Cardio-Pulmonary Department  ? ? ?
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
## 2021-08-11 NOTE — Progress Notes (Signed)
PT Cancellation Note ? ?Patient Details ?Name: Gary [name] ?MRN: [id number] ?DOB: December 01, 1955 ? ? ?Cancelled Treatment:    Reason Eval/Treat Not Completed: Patient not medically ready PT orders received, chart reviewed. Nurse notes pt is not appropriate for PT intervention at this time as pt is back on bipap. Will defer any attempts at this time & also f/u with team re: appropriateness of new orders/if pt is now willing to participate in therapy.  ? ?Lavone Nian, PT, DPT ?08/11/21, 12:05 PM ? ? ?[name] ?08/11/2021, 12:04 PM ?

## 2021-08-11 NOTE — Consult Note (Signed)
PHARMACY CONSULT NOTE ? ?Pharmacy Consult for Electrolyte Monitoring and Replacement  ? ?Recent Labs: ?Potassium (mmol/L)  ?Date Value  ?08/11/2021 4.2  ?04/15/2014 3.9  ? ?Magnesium (mg/dL)  ?Date Value  ?08/11/2021 2.6 (H)  ?08/21/2012 1.9  ? ?Calcium (mg/dL)  ?Date Value  ?08/11/2021 8.2 (L)  ? ?Calcium, Total (mg/dL)  ?Date Value  ?04/15/2014 8.3 (L)  ? ?Albumin (g/dL)  ?Date Value  ?08/07/2021 3.3 (L)  ?04/15/2014 2.9 (L)  ? ?Phosphorus (mg/dL)  ?Date Value  ?08/11/2021 3.1  ? ?Sodium (mmol/L)  ?Date Value  ?08/11/2021 138  ?03/15/2020 145 (H)  ?04/15/2014 140  ? ?Corrected calcium 8.8 mg/dL ? ?Assessment: ?66 yo M, with medical history of diastolic CHF (EF 60 - 10%, grade III DD, 11/22), COPD on home O2 2L, HTN, OSA on CPAP, PVD, HLD, former smoker, morbidly obese (BMI 49.65), GI bleeding, CKD 3, Iron deficiency anemia, presented to the ED on 4/24 for SOB and admitted due to acute exacerbation on chronic diastolic CHF with  leg edema and pulmonary vascular congestion on X-ray. ? ?Diuretics: torsemide 20 mg daily ? ?Goal of Therapy:  ?K 4.0 - 5.1 mmol/L ?Mg 2.0 - 2.4 mg/dL ?Other electrolytes WNL ?  ? ?Plan:  ?No replacement needed at the moment. ?  ?F/u with AM labs. ? ?Dallie Piles ,PharmD  ?08/11/2021 7:00 AM  ?

## 2021-08-11 NOTE — Progress Notes (Signed)
?Progress Note ? ? ?Patient: Gary [name] BFX:[id number] DOB: [birth year] DOA: 08/07/2021     4 ?DOS: the patient was seen and examined on 08/11/2021 ?  ? ? ?Assessment and Plan: ?* Acute on chronic respiratory failure with hypercapnia ([name]) ?On 08/07/2021 PCO2 was 123.  The patient was intubated during the hospital stay and extubated.  Currently was on BiPAP this morning secondary to lethargy.  We will check an ABG again to see if still CO2 retention.  Lethargy may be secondary to the Seroquel at night.  Transitional care team to check to see if patient has a noninvasive ventilation machine at home.  Patient's friend thinks that he has a CPAP at home. ? ?Acute on chronic diastolic CHF (congestive heart failure) ([place]) ?Medications for heart failure were held secondary to low blood pressure.  Restart low-dose torsemide.  Holding other medications currently.  Last EF 60 to 65%. ? ?COPD (chronic obstructive pulmonary disease) ([place]) ?Continue nebulizers Pulmicort and DuoNeb.  Patient on low-dose Solu-Medrol ? ?Acute metabolic encephalopathy ?Patient lethargic this a.m. answers a few questions and went back to sleep.  Not sure if it secondary to the Seroquel or CO2 retention.  Will obtain an ABG. ? ?Thrombocytopenia ([place]) ?Patient has chronic intermittent thrombocytopenia.  Last platelet count 148. ? ?Iron deficiency anemia ?Last hemoglobin 11.9 ? ?HLD (hyperlipidemia) ?Continue zocor ? ?Chronic kidney disease, stage 3a ([place]) ?Creatinine 1.48 today.  GFR 53. ? ?Obstructive sleep apnea ?If he has a CPAP at home this will have to be switched over to noninvasive ventilation ? ?HTN (hypertension) ?Coreg on hold with hypotension.  Try to restart oral torsemide today ? ?Obesity, Class III, BMI 40-49.9 (morbid obesity) ([place]) ?BMI 48.95 ? ? ? ? ? ?  ? ?Subjective: Patient answers a few yes or no questions and went back to sleep.  I noticed that he was given a dose of Seroquel last night.  We will get an ABG. ? ?Physical  Exam: ?Vitals:  ? 08/11/21 0759 08/11/21 0800 08/11/21 0930 08/11/21 0950  ?BP:      ?Pulse: 85 85 87 87  ?Resp: (!) 25 (!) 32    ?Temp:      ?TempSrc:      ?SpO2: (!) 85% 95% (!) 77% 98%  ?Weight:      ?Height:      ? ?Physical Exam ?HENT:  ?   Head: Normocephalic.  ?   Mouth/Throat:  ?   Pharynx: No oropharyngeal exudate.  ?Eyes:  ?   General: Lids are normal.  ?   Conjunctiva/sclera: Conjunctivae normal.  ?Cardiovascular:  ?   Rate and Rhythm: Normal rate and regular rhythm.  ?   Heart sounds: Normal heart sounds, S1 normal and S2 normal.  ?Pulmonary:  ?   Breath sounds: Examination of the right-lower field reveals decreased breath sounds. Examination of the left-lower field reveals decreased breath sounds. Decreased breath sounds present. No wheezing, rhonchi or rales.  ?Abdominal:  ?   Palpations: Abdomen is soft.  ?   Tenderness: There is no abdominal tenderness.  ?Musculoskeletal:  ?   Right lower leg: Swelling present.  ?   Left lower leg: Swelling present.  ?Skin: ?   General: Skin is warm.  ?   Findings: No rash.  ?Neurological:  ?   Mental Status: He is lethargic.  ?  ?Data Reviewed: ?Last platelet count 148, hemoglobin 11.9, creatinine 1.48 ? ?Family Communication: Spoke with friend on the phone ? ?Disposition: ?Status is: Inpatient ?Remains inpatient  appropriate because: Lethargic today.  We will get an ABG.  Could be secondary to the Seroquel that was given last night ? ?Planned Discharge Destination: Home depending on clinical course ? ? ?Author: ?Loletha Grayer, MD ?08/11/2021 12:50 PM ? ?For on call review [email or web address].  ?

## 2021-08-12 DIAGNOSIS — J9622 Acute and chronic respiratory failure with hypercapnia: Secondary | ICD-10-CM | POA: Diagnosis not present

## 2021-08-12 LAB — GLUCOSE, CAPILLARY
Glucose-Capillary: 92 mg/dL (ref 70–99)
Glucose-Capillary: 92 mg/dL (ref 70–99)
Glucose-Capillary: 110 mg/dL — ABNORMAL HIGH (ref 70–99)
Glucose-Capillary: 135 mg/dL — ABNORMAL HIGH (ref 70–99)
Glucose-Capillary: 115 mg/dL — ABNORMAL HIGH (ref 70–99)

## 2021-08-12 LAB — CBC
HCT: 44.7 % (ref 39.0–52.0)
Hemoglobin: 11.7 g/dL — ABNORMAL LOW (ref 13.0–17.0)
MCH: 23.1 pg — ABNORMAL LOW (ref 26.0–34.0)
MCHC: 26.2 g/dL — ABNORMAL LOW (ref 30.0–36.0)
MCV: 88.3 fL (ref 80.0–100.0)
Platelets: 129 10*3/uL — ABNORMAL LOW (ref 150–400)
RBC: 5.06 MIL/uL (ref 4.22–5.81)
RDW: 17.2 % — ABNORMAL HIGH (ref 11.5–15.5)
WBC: 5.5 10*3/uL (ref 4.0–10.5)
nRBC: 0.9 % — ABNORMAL HIGH (ref 0.0–0.2)

## 2021-08-12 LAB — PHOSPHORUS: Phosphorus: 3.3 mg/dL (ref 2.5–4.6)

## 2021-08-12 LAB — MAGNESIUM: Magnesium: 2.7 mg/dL — ABNORMAL HIGH (ref 1.7–2.4)

## 2021-08-12 LAB — BASIC METABOLIC PANEL
Anion gap: 2 — ABNORMAL LOW (ref 5–15)
BUN: 33 mg/dL — ABNORMAL HIGH (ref 8–23)
CO2: 30 mmol/L (ref 22–32)
Calcium: 8.4 mg/dL — ABNORMAL LOW (ref 8.9–10.3)
Chloride: 110 mmol/L (ref 98–111)
Creatinine, Ser: 1.16 mg/dL (ref 0.61–1.24)
GFR, Estimated: >60 mL/min (ref >60)
Glucose, Bld: 82 mg/dL (ref 70–99)
Potassium: 4.6 mmol/L (ref 3.5–5.1)
Sodium: 142 mmol/L (ref 135–145)

## 2021-08-12 MED ORDER — ENOXAPARIN SODIUM 100 MG/ML IJ SOSY
0.5000 mg/kg | PREFILLED_SYRINGE | Freq: Every day | INTRAMUSCULAR | Status: DC
  Administered 2021-08-12 – 2021-08-14 (×3): 90 mg via SUBCUTANEOUS
  Filled 2021-08-12 (×3): qty 0.9

## 2021-08-12 NOTE — TOC Progression Note (Signed)
Transition of Care (TOC) - Progression Note  ? ? ?Patient Details  ?Name: Gary [name] ?MRN: [id number] ?Date of Birth: 1956-03-27 ? ?Transition of Care (TOC) CM/SW Contact  ?[place], LCSWA ?Phone Number: ?08/12/2021, 10:56 AM ? ?Clinical Narrative:    ? ?CSW spoke to Romeo with [employer] to confirm patient currently has Cpap at home. ? ?TOC plan of care ongoing. ? ? ?Expected Discharge Plan: Home/Self Care ?Barriers to Discharge: Continued Medical Work up ? ?Expected Discharge Plan and Services ?Expected Discharge Plan: Home/Self Care ?  ?Discharge Planning Services: CM Consult ?  ?Living arrangements for the past 2 months: [place] ?                ?  ?  ?  ?  ?  ?HH Arranged: NA ?[place]: NA ?  ?  ?  ? ? ?Social Determinants of Health (SDOH) Interventions ?  ? ?Readmission Risk Interventions ? ?  05/04/2021  ?  1:26 PM 04/07/2021  ?  1:39 PM 03/12/2021  ? 11:54 AM  ?Readmission Risk Prevention Plan  ?Transportation Screening Complete Complete Complete  ?PCP or Specialist Appt within 3-5 Days  Complete Complete  ?[place] or Home Care Consult   Complete  ?Social Work Consult for [place] Planning/Counseling   Complete  ?Palliative Care Screening  Not Applicable Not Applicable  ?Medication Review Press photographer) Complete Complete Complete  ?PCP or Specialist appointment within 3-5 days of discharge Complete    ?[place] or Home Care Consult Complete    ?SW Recovery Care/Counseling Consult Complete    ?Palliative Care Screening Not Applicable    ?[place] Not Applicable    ? ? ?

## 2021-08-12 NOTE — Consult Note (Signed)
PHARMACY CONSULT NOTE ? ?Pharmacy Consult for Electrolyte Monitoring and Replacement  ? ?Recent Labs: ?Potassium (mmol/L)  ?Date Value  ?08/12/2021 4.6  ?04/15/2014 3.9  ? ?Magnesium (mg/dL)  ?Date Value  ?08/12/2021 2.7 (H)  ?08/21/2012 1.9  ? ?Calcium (mg/dL)  ?Date Value  ?08/12/2021 8.4 (L)  ? ?Calcium, Total (mg/dL)  ?Date Value  ?04/15/2014 8.3 (L)  ? ?Albumin (g/dL)  ?Date Value  ?08/07/2021 3.3 (L)  ?04/15/2014 2.9 (L)  ? ?Phosphorus (mg/dL)  ?Date Value  ?08/12/2021 3.3  ? ?Sodium (mmol/L)  ?Date Value  ?08/12/2021 142  ?03/15/2020 145 (H)  ?04/15/2014 140  ? ?Corrected calcium 8.8 mg/dL ? ?Assessment: ?66 yo M, with medical history of diastolic CHF (EF 60 - 26%, grade III DD, 11/22), COPD on home O2 2L, HTN, OSA on CPAP, PVD, HLD, former smoker, morbidly obese (BMI 49.65), GI bleeding, CKD 3, Iron deficiency anemia, presented to the ED on 4/24 for SOB and admitted due to acute exacerbation on chronic diastolic CHF with  leg edema and pulmonary vascular congestion on X-ray. ? ?Diuretics: torsemide 20 mg daily ? ?Goal of Therapy:  ?K 4.0 - 5.1 mmol/L ?Mg 2.0 - 2.4 mg/dL ?Other electrolytes WNL ?  ? ?Plan:  ?No replacement needed at this time. ?  ?F/u with AM labs. ? ?Noralee Space ,PharmD  ?08/12/2021 10:57 AM  ?
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
## 2021-08-12 NOTE — Progress Notes (Signed)
PT Cancellation Note ? ?Patient Details ?Name: Gary [name] ?MRN: [id number] ?DOB: 06/22/1955 ? ? ?Cancelled Treatment:    Reason Eval/Treat Not Completed: Patient not medically ready PT orders received, chart reviewed. Pt noted to still be on bipap and in most recent notes pt does not appear to be doing well medically. Will hold PT evaluation at this time & f/u as able & as pt is medically appropriate. ? ?Lavone Nian, PT, DPT ?08/12/21, 8:00 AM ? ? ?[name] ?08/12/2021, 7:53 AM ?

## 2021-08-12 NOTE — Progress Notes (Signed)
PT Cancellation Note ? ?Patient Details ?Name: Gary [name] ?MRN: [id number] ?DOB: 1955/08/10 ? ? ?Cancelled Treatment:    Reason Eval/Treat Not Completed: Patient declined, no reason specified Per chart & nurse, pt doing better medically, now on 2L/min via nasal cannula. Pt received in bed but strongly declines participation in therapy on this date, reporting he's not going to participate today. Pt reports he ambulated yesterday & the day prior (question accuracy of this). Pt reports he may participate tomorrow. Will re-attempt evaluation tomorrow & if pt does not participate then, PT will sign off per protocol & 3 failed attempts. ? ?Lavone Nian, PT, DPT ?08/12/21, 9:56 AM ? ? ?Gary [name] ?08/12/2021, 9:55 AM ?

## 2021-08-13 DIAGNOSIS — J9622 Acute and chronic respiratory failure with hypercapnia: Secondary | ICD-10-CM | POA: Diagnosis not present

## 2021-08-13 LAB — BASIC METABOLIC PANEL
Anion gap: 5 (ref 5–15)
BUN: 30 mg/dL — ABNORMAL HIGH (ref 8–23)
CO2: 29 mmol/L (ref 22–32)
Calcium: 8.5 mg/dL — ABNORMAL LOW (ref 8.9–10.3)
Chloride: 107 mmol/L (ref 98–111)
Creatinine, Ser: 1.22 mg/dL (ref 0.61–1.24)
GFR, Estimated: >60 mL/min (ref >60)
Glucose, Bld: 112 mg/dL — ABNORMAL HIGH (ref 70–99)
Potassium: 4.4 mmol/L (ref 3.5–5.1)
Sodium: 141 mmol/L (ref 135–145)

## 2021-08-13 LAB — CBC WITH DIFFERENTIAL/PLATELET
Abs Immature Granulocytes: 0.02 10*3/uL (ref 0.00–0.07)
Basophils Absolute: 0 10*3/uL (ref 0.0–0.1)
Basophils Relative: 0 %
Eosinophils Absolute: 0.1 10*3/uL (ref 0.0–0.5)
Eosinophils Relative: 1 %
HCT: 41.9 % (ref 39.0–52.0)
Hemoglobin: 11.3 g/dL — ABNORMAL LOW (ref 13.0–17.0)
Immature Granulocytes: 0 %
Lymphocytes Relative: 20 %
Lymphs Abs: 1.4 10*3/uL (ref 0.7–4.0)
MCH: 23.1 pg — ABNORMAL LOW (ref 26.0–34.0)
MCHC: 27 g/dL — ABNORMAL LOW (ref 30.0–36.0)
MCV: 85.7 fL (ref 80.0–100.0)
Monocytes Absolute: 0.9 10*3/uL (ref 0.1–1.0)
Monocytes Relative: 12 %
Neutro Abs: 4.7 10*3/uL (ref 1.7–7.7)
Neutrophils Relative %: 67 %
Platelets: 137 10*3/uL — ABNORMAL LOW (ref 150–400)
RBC: 4.89 MIL/uL (ref 4.22–5.81)
RDW: 16.8 % — ABNORMAL HIGH (ref 11.5–15.5)
WBC: 7 10*3/uL (ref 4.0–10.5)
nRBC: 0.7 % — ABNORMAL HIGH (ref 0.0–0.2)

## 2021-08-13 LAB — GLUCOSE, CAPILLARY
Glucose-Capillary: 103 mg/dL — ABNORMAL HIGH (ref 70–99)
Glucose-Capillary: 105 mg/dL — ABNORMAL HIGH (ref 70–99)
Glucose-Capillary: 113 mg/dL — ABNORMAL HIGH (ref 70–99)
Glucose-Capillary: 115 mg/dL — ABNORMAL HIGH (ref 70–99)
Glucose-Capillary: 119 mg/dL — ABNORMAL HIGH (ref 70–99)
Glucose-Capillary: 87 mg/dL (ref 70–99)

## 2021-08-13 LAB — MAGNESIUM: Magnesium: 2.5 mg/dL — ABNORMAL HIGH (ref 1.7–2.4)

## 2021-08-13 LAB — PHOSPHORUS: Phosphorus: 2.4 mg/dL — ABNORMAL LOW (ref 2.5–4.6)

## 2021-08-13 MED ORDER — K PHOS MONO-SOD PHOS DI & MONO 155-852-130 MG PO TABS
250.0000 mg | ORAL_TABLET | ORAL | Status: AC
  Administered 2021-08-13 (×2): 250 mg via ORAL
  Filled 2021-08-13 (×2): qty 1

## 2021-08-13 NOTE — Consult Note (Signed)
PHARMACY CONSULT NOTE ? ?Pharmacy Consult for Electrolyte Monitoring and Replacement  ? ?Recent Labs: ?Potassium (mmol/L)  ?Date Value  ?08/13/2021 4.4  ?04/15/2014 3.9  ? ?Magnesium (mg/dL)  ?Date Value  ?08/13/2021 2.5 (H)  ?08/21/2012 1.9  ? ?Calcium (mg/dL)  ?Date Value  ?08/13/2021 8.5 (L)  ? ?Calcium, Total (mg/dL)  ?Date Value  ?04/15/2014 8.3 (L)  ? ?Albumin (g/dL)  ?Date Value  ?08/07/2021 3.3 (L)  ?04/15/2014 2.9 (L)  ? ?Phosphorus (mg/dL)  ?Date Value  ?08/13/2021 2.4 (L)  ? ?Sodium (mmol/L)  ?Date Value  ?08/13/2021 141  ?03/15/2020 145 (H)  ?04/15/2014 140  ? ?Corrected calcium 9.1 mg/dL ? ?Assessment: ?66 yo M, with medical history of diastolic CHF (EF 60 - 50%, grade III DD, 11/22), COPD on home O2 2L, HTN, OSA on CPAP, PVD, HLD, former smoker, morbidly obese (BMI 49.65), GI bleeding, CKD 3, Iron deficiency anemia, presented to the ED on 4/24 for SOB and admitted due to acute exacerbation on chronic diastolic CHF with  leg edema and pulmonary vascular congestion on X-ray. ? ?Diuretics: torsemide 20 mg daily ? ?Goal of Therapy:  ?K 4.0 - 5.1 mmol/L ?Mg 2.0 - 2.4 mg/dL ?Other electrolytes WNL ?  ? ?Plan:  ?Phos 2.4- Will order KPhos neutral 1 tab q4h po x 2 doses. ?F/u with AM labs. ? ?Noralee Space ,PharmD  ?08/13/2021 10:24 AM  ?

## 2021-08-13 NOTE — Progress Notes (Signed)
?PROGRESS NOTE ? ? ? ?Gary [name]  HUT:[id number] DOB: 1956/01/12 DOA: 08/07/2021 ?PCP: [facility]  ? ? ?Brief Narrative:  ?66 y.o. male admitted with Acute Metabolic Encephalopathy due to CO2 narcosis, Acute on Chronic Hypercapnic Respiratory Failure in the setting of Acute Decompensated HFpEF, AECOPD OSA & OHS.  Failed trial of BiPAP requiring intubation and mechanical ventilation ? ?Gary [name] is a 66 year old male with a past medical history significant for HF PEF (LVEF 60 to 65%, grade 3 DD), COPD on 2 L supplemental O2 at baseline, morbid obesity, OSA on CPAP, OHS, hypertension, PVD, hyperlipidemia, CKD stage III, iron deficiency anemia who presented to [facility] ED on 08/07/2021 due to complaints of shortness of breath. ?  ?Patient is currently somnolent and on BiPAP and unable to participate in history and no family is present currently, therefore history is obtained from chart review.  Per ED and nursing notes, he presented with a 2-day history of progressive shortness of breath along with associated dry cough,  worsening lower extremity edema, and increasing lethargy.  He reportedly slept from Saturday morning until Sunday evening, which he was not compliant with CPAP at that time or taking medications over that time period. He denied chest pain, palpitations, nausea, vomiting, abdominal pain, dysuria. He reported that he has never really felt better since after being discharged from the hospital last admission on 4/9. ?  ?In the ED he was noted to be lethargic, but arousable (oriented x3 when aroused).  He was placed on BiPAP. ?  ?Of note he was recently hospitalized from 07/20/21 to 07/23/21 with acute on chronic hypoxic & hypercapnic respiratory failure with acute metabolic encephalopathy due to CO2 narcosis in the setting of OSA/OHS/noncompliance with CPAP, pulmonary hypertension, and suspected pulmonary edema requiring BiPAP.  This is his 3rd hospitalized in 5 months for the same issue.   There is some concern regarding compliance with CPAP at home.   ?  ?ED Course: ?Initial Vital Signs: Temperature 99.1 ?F orally, respiratory rate 19, pulse 84, blood pressure 117/79, SPO2 96% on 2 L nasal cannula ?Significant Labs: Bicarbonate 41, glucose 114, BUN 26, creatinine 1.39, BNP 120, high-sensitivity troponin 7, WBC 7.3, hemoglobin 12.2, hematocrit 46.9 ?Venous blood gas: pH 7-6/PCO2 110/PO2 65/bicarbonate 49.4 ?COVID-19 PCR negative ?Imaging ?Chest X-ray>>IMPRESSION: ?Stable cardiomegaly with central pulmonary vascular congestion and ?possible bilateral perihilar edema ?Medications Administered: Lasix 80 mg IV x1 ?  ?He was admitted by the hospitalist for further work-up and treatment of acute on chronic hypercapnic respiratory failure. ?  ?However after arrival to ICU he was noted to become more somnolent, and ABG showed persistent hypercapnia despite BiPAP.  Decision was made to intubate and placed on mechanical ventilation. PCCM has assumed care. ? ?4/24: Admitted to stepdown unit by hospitalist.  Failed trial of BiPAP requiring intubation and mechanical ventilation.   ?4/25: Pt remains mechanically intubated FiO2'@50'$ %/PEEP 5  ?4/26: EXTUBATED ?4/27: Hold diuresis today due to worsening Creatinine. Off BiPAP during day, mandatory at night. Downgrade to Stepdown status ?4/28: Pt with worsening acute encephalopathy PCCM reconsulted concern for HIGH RISK OF INTUBATION.  However, encephalopathy resolved  ?4/29: Pt alert and oriented stable on 2L O2 via nasal canula wore Bipap overnight.  PCCM team will transfer service to [facility]   ?4/30 TRH pickup ? ?  ? ? ? ?Consultants:  ?pccm ? ?Procedures: s/p intubation ? ?Antimicrobials:  ?4/24: SARS-CoV-2 and influenza PCR>> negative ? ? ?Subjective: ?Lying in bed without much sob, cp, dizziness ? ?Objective: ?  Vitals:  ? 08/13/21 0426 08/13/21 0756 08/13/21 0800 08/13/21 1000  ?BP:  118/73 120/72 136/87  ?Pulse:      ?Resp:      ?Temp:  98.5 ?F (36.9 ?C)    ?TempSrc:   Oral    ?SpO2:  99%    ?Weight: (!) 187.4 kg     ?Height:      ? ? ?Intake/Output Summary (Last 24 hours) at 08/13/2021 1553 ?Last data filed at 08/13/2021 1400 ?Gross per 24 hour  ?Intake 480 ml  ?Output 3250 ml  ?Net -2770 ml  ? ?Filed Weights  ? 08/10/21 0313 08/11/21 0500 08/13/21 0426  ?Weight: (!) 182.4 kg (!) 182.4 kg (!) 187.4 kg  ? ? ?Examination: ?Calm, NAD, morbid obesity ?Decrease bs no wheezing ?Reg s1/s2 no gallop ?Soft benign +bs ?LLE mild edema >RLE ?Awake and alert ?Mood and affect appropriate in current setting  ? ? ? ?Data Reviewed: I have personally reviewed following labs and imaging studies ? ?CBC: ?Recent Labs  ?Lab 08/07/21 ?0824 08/08/21 ?0438 08/09/21 ?0417 08/10/21 ?0400 08/11/21 ?0600 08/12/21 ?8527 08/13/21 ?0527  ?WBC 7.3   < > 7.2 6.1 5.3 5.5 7.0  ?NEUTROABS 5.2  --   --   --   --   --  4.7  ?HGB 12.2*   < > 11.8* 11.9* 11.9* 11.7* 11.3*  ?HCT 46.9   < > 43.0 44.3 44.6 44.7 41.9  ?MCV 90.4   < > 84.0 86.0 87.5 88.3 85.7  ?PLT 186   < > 180 176 148* 129* 137*  ? < > = values in this interval not displayed.  ? ?Basic Metabolic Panel: ?Recent Labs  ?Lab 08/09/21 ?0417 08/10/21 ?0400 08/11/21 ?0600 08/12/21 ?7824 08/13/21 ?0527  ?NA 141 139 138 142 141  ?K 3.9 4.3 4.2 4.6 4.4  ?CL 105 107 106 110 107  ?CO2 '30 29 28 30 29  '$ ?GLUCOSE 100* 99 99 82 112*  ?BUN 24* 37* 38* 33* 30*  ?CREATININE 1.32* 1.71* 1.48* 1.16 1.22  ?CALCIUM 8.6* 8.2* 8.2* 8.4* 8.5*  ?MG 2.2 2.4 2.6* 2.7* 2.5*  ?PHOS 3.8 4.9* 3.1 3.3 2.4*  ? ?GFR: ?Estimated Creatinine Clearance: 109.9 mL/min (by C-G formula based on SCr of 1.22 mg/dL). ?Liver Function Tests: ?Recent Labs  ?Lab 08/07/21 ?2353  ?AST 12*  ?ALT 10  ?ALKPHOS 95  ?BILITOT 0.4  ?PROT 8.4*  ?ALBUMIN 3.3*  ? ?No results for input(s): LIPASE, AMYLASE in the last 168 hours. ?No results for input(s): AMMONIA in the last 168 hours. ?Coagulation Profile: ?No results for input(s): INR, PROTIME in the last 168 hours. ?Cardiac Enzymes: ?No results for input(s): CKTOTAL,  CKMB, CKMBINDEX, TROPONINI in the last 168 hours. ?BNP (last 3 results) ?No results for input(s): PROBNP in the last 8760 hours. ?HbA1C: ?No results for input(s): HGBA1C in the last 72 hours. ?CBG: ?Recent Labs  ?Lab 08/12/21 ?1918 08/12/21 ?2330 08/13/21 ?0324 08/13/21 ?6144 08/13/21 ?1132  ?GLUCAP 115* 87 105* 113* 103*  ? ?Lipid Profile: ?No results for input(s): CHOL, HDL, LDLCALC, TRIG, CHOLHDL, LDLDIRECT in the last 72 hours. ?Thyroid Function Tests: ?No results for input(s): TSH, T4TOTAL, FREET4, T3FREE, THYROIDAB in the last 72 hours. ?Anemia Panel: ?No results for input(s): VITAMINB12, FOLATE, FERRITIN, TIBC, IRON, RETICCTPCT in the last 72 hours. ?Sepsis Labs: ?No results for input(s): PROCALCITON, LATICACIDVEN in the last 168 hours. ? ?Recent Results (from the past 240 hour(s))  ?Resp Panel by RT-PCR (Flu A&B, Covid) Nasopharyngeal Swab     Status:  None  ? Collection Time: 08/07/21  8:24 AM  ? Specimen: Nasopharyngeal Swab; Nasopharyngeal(NP) swabs in vial transport medium  ?Result Value Ref Range Status  ? SARS Coronavirus 2 by RT PCR NEGATIVE NEGATIVE Final  ?  Comment: (NOTE) ?SARS-CoV-2 target nucleic acids are NOT DETECTED. ? ?The SARS-CoV-2 RNA is generally detectable in upper respiratory ?specimens during the acute phase of infection. The lowest ?concentration of SARS-CoV-2 viral copies this assay can detect is ?138 copies/mL. A negative result does not preclude SARS-Cov-2 ?infection and should not be used as the sole basis for treatment or ?other patient management decisions. A negative result may occur with  ?improper specimen collection/handling, submission of specimen other ?than nasopharyngeal swab, presence of viral mutation(s) within the ?areas targeted by this assay, and inadequate number of viral ?copies(<138 copies/mL). A negative result must be combined with ?clinical observations, patient history, and epidemiological ?information. The expected result is Negative. ? ?Fact Sheet for  Patients:  ?EntrepreneurPulse.com.au ? ?Fact Sheet for Healthcare Providers:  ?IncredibleEmployment.be ? ?This test is no t yet approved or cleared by the [place] FDA a

## 2021-08-13 NOTE — Progress Notes (Signed)
Uneventful day, pt Aox4 throughout shift, vitals WNL, 2L [place], 1850 Urine output, very pleasant, sat up on edge of bed 3 times, changed purewick and condom cath 1 time due to leaking, pt currently sleeping. Will continue to monitor. ?

## 2021-08-13 NOTE — TOC Progression Note (Addendum)
Transition of Care (TOC) - Progression Note  ? ? ?Patient Details  ?Name: Gary [name] ?MRN: [id number] ?Date of Birth: 1955/05/12 ? ?Transition of Care (TOC) CM/SW Contact  ?[place], LCSWA ?Phone Number: ?08/13/2021, 1:32 PM ? ?Clinical Narrative:    ? ?1:24pm: CSW called patients point of contact [name] (friend) [phone] to inform about PT's Home Home PT recommendation. CSW also hoped to learn if patient had history of [place] services and or preferences for [facility] providers. CSW left [name] a Advertising account executive. ? ?1:39pm: CSW reached out to [place] with [facility] and he confirmed they are not accepting patient's insurance at this time. Based on insurance, the patient will likely have to do outpatient PT. ? ?2:18pm: CSW spoke to patient at bedside. CSW provided outpatient PT options. Patient will likely go to [facility]. Patient confirmed he is agreeable to outpatient PT. CSW explained to call the number during business hours to schedule an appointment. Patient request BiPAP and CSW let provider know.  ? ?3:24pm: CSW called Jasmine with [employer] to order BiPAP. ? ?Expected Discharge Plan: Home/Self Care ?Barriers to Discharge: Continued Medical Work up ? ?Expected Discharge Plan and Services ?Expected Discharge Plan: Home/Self Care ?  ?Discharge Planning Services: CM Consult ?  ?Living arrangements for the past 2 months: [place] ?                ?  ?  ?  ?  ?  ?HH Arranged: NA ?[place] Agency: NA ?  ?  ?  ? ? ?Social Determinants of Health (SDOH) Interventions ?  ? ?Readmission Risk Interventions ? ?  05/04/2021  ?  1:26 PM 04/07/2021  ?  1:39 PM 03/12/2021  ? 11:54 AM  ?Readmission Risk Prevention Plan  ?Transportation Screening Complete Complete Complete  ?PCP or Specialist Appt within 3-5 Days  Complete Complete  ?[place] or Home Care Consult   Complete  ?Social Work Consult for [place] Planning/Counseling   Complete  ?Palliative Care Screening  Not Applicable Not Applicable  ?Medication Review Human resources officer) Complete Complete Complete  ?PCP or Specialist appointment within 3-5 days of discharge Complete    ?[place] or Home Care Consult Complete    ?SW Recovery Care/Counseling Consult Complete    ?Palliative Care Screening Not Applicable    ?[place] Not Applicable    ? ? ?

## 2021-08-13 NOTE — Plan of Care (Signed)
  Problem: Education: Goal: Ability to demonstrate management of disease process will improve Outcome: Progressing Goal: Ability to verbalize understanding of medication therapies will improve Outcome: Progressing   Problem: Activity: Goal: Capacity to carry out activities will improve Outcome: Progressing   Problem: Cardiac: Goal: Ability to achieve and maintain adequate cardiopulmonary perfusion will improve Outcome: Progressing   Problem: Education: Goal: Knowledge of General Education information will improve Description: Including pain rating scale, medication(s)/side effects and non-pharmacologic comfort measures Outcome: Progressing   Problem: Health Behavior/Discharge Planning: Goal: Ability to manage health-related needs will improve Outcome: Progressing   Problem: Clinical Measurements: Goal: Ability to maintain clinical measurements within normal limits will improve Outcome: Progressing Goal: Will remain free from infection Outcome: Progressing Goal: Diagnostic test results will improve Outcome: Progressing Goal: Respiratory complications will improve Outcome: Progressing Goal: Cardiovascular complication will be avoided Outcome: Progressing   Problem: Activity: Goal: Risk for activity intolerance will decrease Outcome: Progressing   Problem: Nutrition: Goal: Adequate nutrition will be maintained Outcome: Progressing   Problem: Coping: Goal: Level of anxiety will decrease Outcome: Progressing   Problem: Elimination: Goal: Will not experience complications related to bowel motility Outcome: Progressing Goal: Will not experience complications related to urinary retention Outcome: Progressing   Problem: Pain Managment: Goal: General experience of comfort will improve Outcome: Progressing   Problem: Safety: Goal: Ability to remain free from injury will improve Outcome: Progressing   Problem: Skin Integrity: Goal: Risk for impaired skin integrity  will decrease Outcome: Progressing   

## 2021-08-13 NOTE — Evaluation (Signed)
Physical Therapy Evaluation ?Patient Details ?Name: Gary [name] ?MRN: [id number] ?DOB: [birth year] ?Today's Date: 08/13/2021 ? ?History of Present Illness ? Pt is a 66 y/o male admitted with Acute Metabolic Encephalopathy due to CO2 narcosis, Acute on Chronic Hypercapnic Respiratory Failure in the setting of Acute Decompensated HFpEF, AECOPD OSA & OHS.  Failed trial of BiPAP requiring intubation and mechanical ventilation.  ?Clinical Impression ? Pt seen for PT evaluation with pt received sitting EOB. Pt reports prior to admission he was residing in an apartment with 1 step to enter & ambulatory with SPC. On this date, pt is able to complete STS, but requires EOB to be significantly elevated, with mod I. Pt ambulates with RW & mod I but requires 2 standing rest breaks 2/2 fatigue. Pt endorses slight SOB. Pt on 2L/min via nasal cannula throughout session with lowest SPO2 reading of 78% but question accuracy as poor pleth waveform & quick recovery to >/= 90%. Will continue to follow pt acutely to address endurance, balance, and gait with LRAD. ? ? Of note, pt very disgruntled with PT even at beginning of session, stating "I shouldn't even have done this, it's too much work" when PT was just untangling lines/leads and pt hadn't mobilized yet. Pt also disrespectful to PT at times & states "you all don't care about patients, all you care about is how much money you can make off me" with PT educating pt this is untrue.   ? ?Recommendations for follow up therapy are one component of a multi-disciplinary discharge planning process, led by the attending physician.  Recommendations may be updated based on patient status, additional functional criteria and insurance authorization. ? ?Follow Up Recommendations Home health PT ? ?  ?Assistance Recommended at Discharge PRN  ?Patient can return home with the following ? Assistance with cooking/housework;Help with stairs or ramp for entrance;Assist for transportation ? ?  ?Equipment  Recommendations None recommended by PT (pt reports he owns a RW)  ?Recommendations for Other Services ?    ?  ?Functional Status Assessment Patient has had a recent decline in their functional status and demonstrates the ability to make significant improvements in function in a reasonable and predictable amount of time.  ? ?  ?Precautions / Restrictions Precautions ?Precautions: Fall ?Restrictions ?Weight Bearing Restrictions: No  ? ?  ? ?Mobility ? Bed Mobility ?  ?  ?  ?  ?  ?  ?  ?General bed mobility comments: pt received sitting EOB ?  ? ?Transfers ?Overall transfer level: Needs assistance ?Equipment used: Rolling walker (2 wheels) ?Transfers: Sit to/from Stand ?Sit to Stand: Modified independent (Device/Increase time) ?  ?  ?  ?  ?  ?General transfer comment: requires elevated EOB ?  ? ?Ambulation/Gait ?Ambulation/Gait assistance: Modified independent (Device/Increase time) ?Gait Distance (Feet): 100 Feet ?Assistive device: Rolling walker (2 wheels) ?Gait Pattern/deviations: Decreased stride length ?Gait velocity: decreased ?  ?  ?General Gait Details: 2 standing rest breaks 2/2 fatigue ? ?Stairs ?  ?  ?  ?  ?  ? ?Wheelchair Mobility ?  ? ?Modified Rankin (Stroke Patients Only) ?  ? ?  ? ?Balance Overall balance assessment: Needs assistance ?Sitting-balance support: Feet supported, No upper extremity supported ?Sitting balance-[name] Scale: Normal ?  ?  ?Standing balance support: During functional activity, Bilateral upper extremity supported ?Standing balance-[name] Scale: Fair ?  ?  ?  ?  ?  ?  ?  ?  ?  ?  ?  ?  ?   ? ? ? ?  Pertinent Vitals/Pain Pain Assessment ?Pain Assessment: No/denies pain  ? ? ?Home Living Family/patient expects to be discharged to:: Private residence ?Living Arrangements: Spouse/significant other ?Available Help at Discharge: Family ?Type of Home: Apartment ?Home Access: Stairs to enter ?Entrance Stairs-Rails: None ?Entrance Stairs-Number of Steps: 1 ?  ?Home Layout: One level ?Home  Equipment: Conservation officer, nature (2 wheels);Cane - single point;BSC/3in1;Hospital bed ?   ?  ?Prior Function   ?  ?  ?  ?  ?  ?  ?Mobility Comments: Pt reports he's ambulatory with SPC ?  ?  ? ? ?Hand Dominance  ?   ? ?  ?Extremity/Trunk Assessment  ? Upper Extremity Assessment ?Upper Extremity Assessment: Overall WFL for tasks assessed ?  ? ?Lower Extremity Assessment ?Lower Extremity Assessment: Overall WFL for tasks assessed;Generalized weakness ?  ? ?   ?Communication  ? Communication: No difficulties  ?Cognition Arousal/Alertness: Awake/alert ?Behavior During Therapy: Flat affect (irritable) ?Overall Cognitive Status: Within Functional Limits for tasks assessed ?  ?  ?  ?  ?  ?  ?  ?  ?  ?  ?  ?  ?  ?  ?  ?  ?  ?  ?  ? ?  ?General Comments   ? ?  ?Exercises    ? ?Assessment/Plan  ?  ?PT Assessment Patient needs continued PT services  ?PT Problem List Decreased strength;Decreased activity tolerance;Decreased balance;Decreased mobility;Cardiopulmonary status limiting activity;Decreased knowledge of use of DME ? ?   ?  ?PT Treatment Interventions DME instruction;Balance training;Gait training;Stair training;Functional mobility training;Therapeutic activities;Therapeutic exercise;Patient/family education;Neuromuscular re-education   ? ?PT Goals (Current goals can be found in the Care Plan section)  ?Acute Rehab PT Goals ?Patient Stated Goal: none stated ?PT Goal Formulation: With patient ?Time For Goal Achievement: 08/27/21 ?Potential to Achieve Goals: Fair ? ?  ?Frequency Min 2X/week ?  ? ? ?Co-evaluation   ?  ?  ?  ?  ? ? ?  ?AM-PAC PT "6 Clicks" Mobility  ?Outcome Measure Help needed turning from your back to your side while in a flat bed without using bedrails?: None ?Help needed moving from lying on your back to sitting on the side of a flat bed without using bedrails?: None ?Help needed moving to and from a bed to a chair (including a wheelchair)?: A Little ?Help needed standing up from a chair using your arms  (e.g., wheelchair or bedside chair)?: A Little ?Help needed to walk in hospital room?: A Little ?Help needed climbing 3-5 steps with a railing? : A Little ?6 Click Score: 20 ? ?  ?End of Session Equipment Utilized During Treatment: Oxygen ?Activity Tolerance: Patient tolerated treatment well ?Patient left: in bed;with call bell/phone within reach ?Nurse Communication: Mobility status (O2) ?PT Visit Diagnosis: Muscle weakness (generalized) (M62.81) ?  ? ?Time: 4327-6147 ?PT Time Calculation (min) (ACUTE ONLY): 25 min ? ? ?Charges:   PT Evaluation ?$PT Eval Moderate Complexity: 1 Mod ?PT Treatments ?$Therapeutic Activity: 8-22 mins ?  ?   ? ? ?Lavone Nian, PT, DPT ?08/13/21, 10:54 AM ? ? ?[name] ?08/13/2021, 10:51 AM ? ?

## 2021-08-14 DIAGNOSIS — J9622 Acute and chronic respiratory failure with hypercapnia: Secondary | ICD-10-CM | POA: Diagnosis not present

## 2021-08-14 DIAGNOSIS — Z7189 Other specified counseling: Secondary | ICD-10-CM | POA: Diagnosis not present

## 2021-08-14 LAB — CBC WITH DIFFERENTIAL/PLATELET
Abs Immature Granulocytes: 0.03 10*3/uL (ref 0.00–0.07)
Basophils Absolute: 0 10*3/uL (ref 0.0–0.1)
Basophils Relative: 0 %
Eosinophils Absolute: 0.1 10*3/uL (ref 0.0–0.5)
Eosinophils Relative: 1 %
HCT: 41.3 % (ref 39.0–52.0)
Hemoglobin: 11.6 g/dL — ABNORMAL LOW (ref 13.0–17.0)
Immature Granulocytes: 0 %
Lymphocytes Relative: 23 %
Lymphs Abs: 2 10*3/uL (ref 0.7–4.0)
MCH: 23.6 pg — ABNORMAL LOW (ref 26.0–34.0)
MCHC: 28.1 g/dL — ABNORMAL LOW (ref 30.0–36.0)
MCV: 84.1 fL (ref 80.0–100.0)
Monocytes Absolute: 0.8 10*3/uL (ref 0.1–1.0)
Monocytes Relative: 9 %
Neutro Abs: 5.9 10*3/uL (ref 1.7–7.7)
Neutrophils Relative %: 67 %
Platelets: 132 10*3/uL — ABNORMAL LOW (ref 150–400)
RBC: 4.91 MIL/uL (ref 4.22–5.81)
RDW: 16.4 % — ABNORMAL HIGH (ref 11.5–15.5)
WBC: 8.8 10*3/uL (ref 4.0–10.5)
nRBC: 0.3 % — ABNORMAL HIGH (ref 0.0–0.2)

## 2021-08-14 LAB — GLUCOSE, CAPILLARY
Glucose-Capillary: 101 mg/dL — ABNORMAL HIGH (ref 70–99)
Glucose-Capillary: 104 mg/dL — ABNORMAL HIGH (ref 70–99)
Glucose-Capillary: 115 mg/dL — ABNORMAL HIGH (ref 70–99)
Glucose-Capillary: 120 mg/dL — ABNORMAL HIGH (ref 70–99)
Glucose-Capillary: 96 mg/dL (ref 70–99)
Glucose-Capillary: 97 mg/dL (ref 70–99)
Glucose-Capillary: 97 mg/dL (ref 70–99)

## 2021-08-14 LAB — BASIC METABOLIC PANEL
Anion gap: 6 (ref 5–15)
BUN: 35 mg/dL — ABNORMAL HIGH (ref 8–23)
CO2: 30 mmol/L (ref 22–32)
Calcium: 8.3 mg/dL — ABNORMAL LOW (ref 8.9–10.3)
Chloride: 102 mmol/L (ref 98–111)
Creatinine, Ser: 1.23 mg/dL (ref 0.61–1.24)
GFR, Estimated: >60 mL/min (ref >60)
Glucose, Bld: 147 mg/dL — ABNORMAL HIGH (ref 70–99)
Potassium: 3.8 mmol/L (ref 3.5–5.1)
Sodium: 138 mmol/L (ref 135–145)

## 2021-08-14 LAB — MAGNESIUM: Magnesium: 2 mg/dL (ref 1.7–2.4)

## 2021-08-14 LAB — PHOSPHORUS: Phosphorus: 2.2 mg/dL — ABNORMAL LOW (ref 2.5–4.6)

## 2021-08-14 MED ORDER — DOCUSATE SODIUM 100 MG PO CAPS
100.0000 mg | ORAL_CAPSULE | Freq: Two times a day (BID) | ORAL | Status: DC
  Filled 2021-08-14: qty 1

## 2021-08-14 MED ORDER — K PHOS MONO-SOD PHOS DI & MONO 155-852-130 MG PO TABS
250.0000 mg | ORAL_TABLET | ORAL | Status: AC
  Administered 2021-08-14 – 2021-08-15 (×4): 250 mg via ORAL
  Filled 2021-08-14 (×6): qty 1

## 2021-08-14 MED ORDER — POTASSIUM CHLORIDE 20 MEQ PO PACK
40.0000 meq | PACK | Freq: Once | ORAL | Status: AC
  Administered 2021-08-14: 40 meq via ORAL
  Filled 2021-08-14: qty 2

## 2021-08-14 NOTE — Progress Notes (Addendum)
Daily Progress Note   Patient Name: Gary [name]       Date: 08/14/2021 DOB: [birth year]  Age: 66 y.o. MRN#: [id number] Attending Physician: Gary Ito, MD Primary Care Physician: [facility] Admit Date: 08/07/2021  Reason for Consultation/Follow-up: Establishing goals of care  Subjective: Notes and labs reviewed. Spoke with staff.  Patient is laying in bed with no family at bedside.  Patient tells me that he has a girlfriend of 20 years that he would want to be his surrogate decision maker if he needs one.  He tells me he has sisters as well.  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   We discussed his diagnoses, prognosis, and GOC.  Created space and opportunity for patient  to explore thoughts and feelings regarding current medical information.   A detailed discussion was had today regarding advanced directives.  Concepts specific to code status, artifical feeding and hydration, IV antibiotics and rehospitalization were discussed.  The difference between an aggressive medical intervention path and a comfort care path was discussed.  Values and goals of care important to patient and family were attempted to be elicited.  Discussed limitations of medical interventions to prolong quality of life in some situations and discussed the concept of human mortality.  Patient states pulmonology has spoken with him.  He tells me that they have discussed the likelihood of needing a tracheostomy in the future.  He states he has a friend that has a tracheostomy, and you would never know it.  We discussed in detail how a tracheostomy can change your life. He states that he is supposed to wear CPAP at night to help with sleep apnea but uses it for a few hours  at a time, if he uses it; he tells me he knows that it helps with CO2 buildup.  Discussed how ongoing sleep apnea without the use of CPAP can affect your body overall.  He states he understands that he needs to lose weight and would be interested in discussing bariatric surgery as it would help his health status and conditions improve, and help prevent the need for tracheostomy.  He tells me at this time he wants any and all care indicated.  He would want CPR if his heart and breathing were to  stop.  Length of Stay: 7  Current Medications: Scheduled Meds:   aspirin  81 mg Oral Daily   budesonide  0.25 mg Nebulization BID   Chlorhexidine Gluconate Cloth  6 each Topical Daily   enoxaparin (LOVENOX) injection  0.5 mg/kg Subcutaneous QHS   finasteride  5 mg Oral Daily   ipratropium-albuterol  3 mL Nebulization TID   mouth rinse  15 mL Mouth Rinse BID   multivitamin with minerals  1 tablet Oral Daily   pantoprazole  40 mg Oral Daily   phosphorus  250 mg Oral Q4H   polyethylene glycol  17 g Oral Daily   potassium chloride  40 mEq Oral Once   Ensure Max Protein  11 oz Oral TID   senna-docusate  1 tablet Oral Daily   simvastatin  10 mg Oral q1800   torsemide  20 mg Oral Daily    Continuous Infusions:  sodium chloride 10 mL/hr at 08/09/21 2000    PRN Meds: acetaminophen, hydrALAZINE, ondansetron (ZOFRAN) IV  Physical Exam Pulmonary:     Effort: Pulmonary effort is normal.  Neurological:     Mental Status: He is alert.            Vital Signs: BP 125/84   Pulse 82   Temp 97.9 F (36.6 C) (Oral)   Resp (!) 21   Ht 6\' 4"  (1.93 m)   Wt (!) 182.1 kg   SpO2 100%   BMI 48.87 kg/m  SpO2: SpO2: 100 % O2 Device: O2 Device: Nasal Cannula O2 Flow Rate: O2 Flow Rate (L/min): 2 L/min  Intake/output summary:  Intake/Output Summary (Last 24 hours) at 08/14/2021 1248 Last data filed at 08/14/2021 0500 Gross per 24 hour  Intake --  Output 2250 ml  Net -2250 ml   LBM: Last BM Date :  08/13/21 Baseline Weight: Weight: (!) 185 kg Most recent weight: Weight: (!) 182.1 kg    Patient Active Problem List   Diagnosis Date Noted   Acute on chronic respiratory failure with hypercapnia ([name]) 08/07/2021   COPD (chronic obstructive pulmonary disease) ([name])    HLD (hyperlipidemia)    Iron deficiency anemia    Chronic kidney disease, stage 3a ([name]) 07/22/2021   Acute cardiac pulmonary edema ([name]) 07/22/2021   Acute on chronic respiratory failure with hypoxia and hypercapnia ([name]) 07/20/2021   Chronic obstructive pulmonary disease ([name])    Positive D dimer    CHF exacerbation ([name]) 05/03/2021   Acute CHF (congestive heart failure) ([name]) 04/04/2021   Weakness    Pneumonia 03/11/2021   Acute respiratory failure with hypoxia and hypercapnia ([name]) 03/11/2021   (HFpEF) heart failure with preserved ejection fraction ([name])    Chronic respiratory failure with hypoxia ([name])    Hyperlipidemia 02/19/2021   Acute respiratory distress syndrome (ARDS) due to COVID-19 virus ([name])    Acute on chronic respiratory failure ([name]) 02/18/2021   Spondylosis without myelopathy or radiculopathy, lumbosacral region 11/15/2020   Lumbosacral facet joint syndrome (Bilateral) 10/31/2020   Generalized osteoarthritis of multiple sites 10/31/2020   AKI (acute kidney injury) ([name]) 03/04/2020   Hyperkalemia 03/04/2020   COPD with acute exacerbation ([name]) 03/02/2020   Marijuana use 01/17/2020   Elevated BUN 01/17/2020   Hypocalcemia 01/17/2020   Hypochloremia 01/17/2020   Low thyroid stimulating hormone (TSH) level 01/17/2020   Vitamin D deficiency 01/17/2020   Elevated sed rate 01/17/2020   Thrombocytopenia ([name]) 01/17/2020   Elevated hematocrit 01/17/2020   Elevated brain natriuretic peptide (BNP) level 01/17/2020  Positive hepatitis C antibody test 01/17/2020   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 mass index (BMI) of 50.0 to 59.9 in adult [facility]) 01/17/2020   Osteoarthritis of  glenohumeral joints (Bilateral) 01/17/2020   Osteoarthritis of AC (acromioclavicular) joints (Bilateral) 01/17/2020   Lumbar Grade 1 Anterolisthesis of L4 over L5 (3 mm) (stable) 01/17/2020   Osteoarthritis of lumbar spine (L3 through S1) 01/17/2020   Lumbar facet osteoarthritis (Multilevel) (Bilateral) 01/17/2020   Osteoarthritis of hips (Bilateral) 01/17/2020   Osteoarthritis of knees (Bilateral) 01/17/2020   Tricompartment osteoarthritis of knees (Bilateral) 01/17/2020   Patellofemoral arthralgia of knees (Bilateral) 01/17/2020   Plantar and Achilles calcaneal spurs (Bilateral) 01/17/2020   Ankle edema (Bilateral) 01/17/2020   Traumatic osteoarthritis of hands (Bilateral) 01/17/2020   Polysubstance abuse ([name]) 08/25/2019   Acute metabolic encephalopathy 08/25/2019   Chronic pain syndrome 07/14/2019   Pharmacologic therapy 07/14/2019   Disorder of skeletal system 07/14/2019   Problems influencing health status 07/14/2019   Chronic low back pain (1ry area of Pain) (Bilateral) (L>R) w/o sciatica 07/14/2019   Chronic shoulder pain (2ry area of Pain) (Bilateral) (L>R) 07/14/2019   Chronic feet pain (3ry area of Pain) (Bilateral) 07/14/2019   Chronic knee pain (4th area of Pain) (Bilateral) (L>R) 07/14/2019   Chronic hip pain (5th area of Pain) (Bilateral) (L>R) 07/14/2019   Chronic hand pain (6th area of Pain) (Bilateral) (L>R) 07/14/2019   Lumbar facet arthropathy 07/14/2019   DDD (degenerative disc disease), lumbosacral 07/14/2019   Closed fracture of shaft of humerus, sequela (Left) 07/14/2019   DDD (degenerative disc disease), cervical 07/14/2019   Tobacco use 05/04/2017   Lymphedema 05/04/2017   CHF (congestive heart failure) ([name]) 04/12/2017   Acute on chronic diastolic CHF (congestive heart failure) ([name]) 02/28/2015   Obstructive sleep apnea 02/28/2015   Acute on chronic diastolic congestive heart failure ([name])    Acute on chronic respiratory failure with hypoxia ([name])     Edema of both feet    Closed fracture of scapula, sequela (Left) 04/01/2014   Obesity, Class III, BMI 40-49.9 (morbid obesity) ([name]) 04/01/2014   HTN (hypertension) 04/01/2014   Multiple rib fractures 03/27/2014    Palliative Care Assessment & Plan   Recommendations/Plan:  -- Will need chaplain to complete H POA papers as he would like his girlfriend to be his H POA and not his sisters.  -- He states he would like to be set up with surgery service outpatient to discuss bariatric surgery as he has been advised of his health conditions, and that he might need a tracheostomy in the future.   Code Status:    Code Status Orders  (From admission, onward)           Start     Ordered   08/07/21 1056  Full code  Continuous        08/07/21 1056           Code Status History     Date Active Date Inactive Code Status Order ID Comments User Context   07/20/2021 1625 07/23/2021 1921 Full Code [id number]  Judithe Modest, NP ED   05/03/2021 2155 05/08/2021 1908 Full Code [id number]  Andris Baumann, MD ED   04/04/2021 1923 04/12/2021 0031 Full Code [id number]  Mansy, Vernetta Honey, MD ED   03/11/2021 0156 03/21/2021 2310 Full Code [id number]  Andris Baumann, MD Inpatient   02/18/2021 2114 02/23/2021 0759 Full Code [id number]  Rust-Chester, Cecelia Byars, NP ED   02/25/2020 1310 03/05/2020  2206 Full Code [id number]  Vida Rigger, MD ED   08/04/2019 1910 08/10/2019 2239 Full Code [id number]  Mansy, Vernetta Honey, MD ED   05/26/2019 1806 06/01/2019 1711 Full Code [id number]  Alford Highland, MD ED   10/14/2018 0330 10/22/2018 1951 Full Code [id number]  Oralia Manis, MD Inpatient   06/25/2018 0026 06/28/2018 1842 Full Code [id number]  Oralia Manis, MD ED   05/29/2018 0001 05/31/2018 1732 Full Code [id number]  Altamese Dilling, MD Inpatient   04/12/2017 1353 04/15/2017 1720 Full Code [id number]  [name], MD Inpatient   01/10/2015 1953 01/14/2015 1703 Full Code [id number]  [name], MD Inpatient   03/27/2014  1847 04/02/2014 1512 Full Code [id number]  Frederik Schmidt, MD ED      Care plan was discussed with RN  Thank you for allowing the Palliative Medicine Team to assist in the care of this patient.  Morton Stall, NP  Please contact Palliative Medicine Team phone at [phone] for questions and concerns.

## 2021-08-14 NOTE — Progress Notes (Signed)
?PROGRESS NOTE ? ? ? ?LONG BRIMAGE  ZDG:[id number] DOB: [birth year] DOA: 08/07/2021 ?PCP: [facility], [facility]  ? ? ?Brief Narrative:  ?66 y.o. male admitted with Acute Metabolic Encephalopathy due to CO2 narcosis, Acute on Chronic Hypercapnic Respiratory Failure in the setting of Acute Decompensated HFpEF, AECOPD OSA & OHS.  Failed trial of BiPAP requiring intubation and mechanical ventilation ? ?Gary [name] is a 66 year old male with a past medical history significant for HF PEF (LVEF 60 to 65%, grade 3 DD), COPD on 2 L supplemental O2 at baseline, morbid obesity, OSA on CPAP, OHS, hypertension, PVD, hyperlipidemia, CKD stage III, iron deficiency anemia who presented to [facility] ED on 08/07/2021 due to complaints of shortness of breath. ?  ?Patient is currently somnolent and on BiPAP and unable to participate in history and no family is present currently, therefore history is obtained from chart review.  Per ED and nursing notes, he presented with a 2-day history of progressive shortness of breath along with associated dry cough,  worsening lower extremity edema, and increasing lethargy.  He reportedly slept from Saturday morning until Sunday evening, which he was not compliant with CPAP at that time or taking medications over that time period. He denied chest pain, palpitations, nausea, vomiting, abdominal pain, dysuria. He reported that he has never really felt better since after being discharged from the hospital last admission on 4/9. ?  ?In the ED he was noted to be lethargic, but arousable (oriented x3 when aroused).  He was placed on BiPAP. ?  ?Of note he was recently hospitalized from 07/20/21 to 07/23/21 with acute on chronic hypoxic & hypercapnic respiratory failure with acute metabolic encephalopathy due to CO2 narcosis in the setting of OSA/OHS/noncompliance with CPAP, pulmonary hypertension, and suspected pulmonary edema requiring BiPAP.  This is his 3rd hospitalized in 5 months for the same issue.   There is some concern regarding compliance with CPAP at home.   ?  ?ED Course: ?Initial Vital Signs: Temperature 99.1 ?F orally, respiratory rate 19, pulse 84, blood pressure 117/79, SPO2 96% on 2 L nasal cannula ?Significant Labs: Bicarbonate 41, glucose 114, BUN 26, creatinine 1.39, BNP 120, high-sensitivity troponin 7, WBC 7.3, hemoglobin 12.2, hematocrit 46.9 ?Venous blood gas: pH 7-6/PCO2 110/PO2 65/bicarbonate 49.4 ?COVID-19 PCR negative ?Imaging ?Chest X-ray>>IMPRESSION: ?Stable cardiomegaly with central pulmonary vascular congestion and ?possible bilateral perihilar edema ?Medications Administered: Lasix 80 mg IV x1 ?  ?He was admitted by the hospitalist for further work-up and treatment of acute on chronic hypercapnic respiratory failure. ?  ?However after arrival to ICU he was noted to become more somnolent, and ABG showed persistent hypercapnia despite BiPAP.  Decision was made to intubate and placed on mechanical ventilation. PCCM has assumed care. ? ?4/24: Admitted to stepdown unit by hospitalist.  Failed trial of BiPAP requiring intubation and mechanical ventilation.   ?4/25: Pt remains mechanically intubated FiO2'@50'$ %/PEEP 5  ?4/26: EXTUBATED ?4/27: Hold diuresis today due to worsening Creatinine. Off BiPAP during day, mandatory at night. Downgrade to Stepdown status ?4/28: Pt with worsening acute encephalopathy PCCM reconsulted concern for HIGH RISK OF INTUBATION.  However, encephalopathy resolved  ?4/29: Pt alert and oriented stable on 2L O2 via nasal canula wore Bipap overnight.  PCCM team will transfer service to [facility]   ?4/30 [facility] pickup ? ? 5/1 good urine output.no overnight issues ? ? ? ?Consultants:  ?pccm ? ?Procedures: s/p intubation ? ?Antimicrobials:  ?4/24: SARS-CoV-2 and influenza PCR>> negative ? ? ?Subjective: ?Feels the same. Does get  sob with ambulation . No cp ? ?Objective: ?Vitals:  ? 08/14/21 0529 08/14/21 0752 08/14/21 1314 08/14/21 1405  ?BP: 125/84   123/70  ?Pulse: 82   87  ?Resp:  (!) 21   18  ?Temp:    98.4 ?F (36.9 ?C)  ?TempSrc:      ?SpO2: 96% 100% 98% 90%  ?Weight: (!) 182.1 kg     ?Height:      ? ? ?Intake/Output Summary (Last 24 hours) at 08/14/2021 1512 ?Last data filed at 08/14/2021 0500 ?Gross per 24 hour  ?Intake --  ?Output 1300 ml  ?Net -1300 ml  ? ?Filed Weights  ? 08/13/21 0426 08/14/21 0348 08/14/21 0529  ?Weight: (!) 187.4 kg (!) 184.5 kg (!) 182.1 kg  ? ? ?Examination: ?Calm, NAD ?Decrease bs  ?Reg s1/s2 no gallop ?Soft benign +bs ?Decrease LE edema ?Aaoxox3  ?Mood and affect appropriate in current setting  ? ? ? ?Data Reviewed: I have personally reviewed following labs and imaging studies ? ?CBC: ?Recent Labs  ?Lab 08/10/21 ?0400 08/11/21 ?0600 08/12/21 ?7106 08/13/21 ?2694 08/14/21 ?8546  ?WBC 6.1 5.3 5.5 7.0 8.8  ?NEUTROABS  --   --   --  4.7 5.9  ?HGB 11.9* 11.9* 11.7* 11.3* 11.6*  ?HCT 44.3 44.6 44.7 41.9 41.3  ?MCV 86.0 87.5 88.3 85.7 84.1  ?PLT 176 148* 129* 137* 132*  ? ?Basic Metabolic Panel: ?Recent Labs  ?Lab 08/10/21 ?0400 08/11/21 ?0600 08/12/21 ?2703 08/13/21 ?5009 08/14/21 ?3818  ?NA 139 138 142 141 138  ?K 4.3 4.2 4.6 4.4 3.8  ?CL 107 106 110 107 102  ?CO2 '29 28 30 29 30  '$ ?GLUCOSE 99 99 82 112* 147*  ?BUN 37* 38* 33* 30* 35*  ?CREATININE 1.71* 1.48* 1.16 1.22 1.23  ?CALCIUM 8.2* 8.2* 8.4* 8.5* 8.3*  ?MG 2.4 2.6* 2.7* 2.5* 2.0  ?PHOS 4.9* 3.1 3.3 2.4* 2.2*  ? ?GFR: ?Estimated Creatinine Clearance: 107.2 mL/min (by C-G formula based on SCr of 1.23 mg/dL). ?Liver Function Tests: ?No results for input(s): AST, ALT, ALKPHOS, BILITOT, PROT, ALBUMIN in the last 168 hours. ? ?No results for input(s): LIPASE, AMYLASE in the last 168 hours. ?No results for input(s): AMMONIA in the last 168 hours. ?Coagulation Profile: ?No results for input(s): INR, PROTIME in the last 168 hours. ?Cardiac Enzymes: ?No results for input(s): CKTOTAL, CKMB, CKMBINDEX, TROPONINI in the last 168 hours. ?BNP (last 3 results) ?No results for input(s): PROBNP in the last 8760 hours. ?HbA1C: ?No  results for input(s): HGBA1C in the last 72 hours. ?CBG: ?Recent Labs  ?Lab 08/13/21 ?1920 08/14/21 ?0010 08/14/21 ?0345 08/14/21 ?2993 08/14/21 ?1124  ?GLUCAP 119* 101* 115* 97 96  ? ?Lipid Profile: ?No results for input(s): CHOL, HDL, LDLCALC, TRIG, CHOLHDL, LDLDIRECT in the last 72 hours. ?Thyroid Function Tests: ?No results for input(s): TSH, T4TOTAL, FREET4, T3FREE, THYROIDAB in the last 72 hours. ?Anemia Panel: ?No results for input(s): VITAMINB12, FOLATE, FERRITIN, TIBC, IRON, RETICCTPCT in the last 72 hours. ?Sepsis Labs: ?No results for input(s): PROCALCITON, LATICACIDVEN in the last 168 hours. ? ?Recent Results (from the past 240 hour(s))  ?Resp Panel by RT-PCR (Flu A&B, Covid) Nasopharyngeal Swab     Status: None  ? Collection Time: 08/07/21  8:24 AM  ? Specimen: Nasopharyngeal Swab; Nasopharyngeal(NP) swabs in vial transport medium  ?Result Value Ref Range Status  ? SARS Coronavirus 2 by RT PCR NEGATIVE NEGATIVE Final  ?  Comment: (NOTE) ?SARS-CoV-2 target nucleic acids are NOT DETECTED. ? ?The SARS-CoV-2 RNA  is generally detectable in upper respiratory ?specimens during the acute phase of infection. The lowest ?concentration of SARS-CoV-2 viral copies this assay can detect is ?138 copies/mL. A negative result does not preclude SARS-Cov-2 ?infection and should not be used as the sole basis for treatment or ?other patient management decisions. A negative result may occur with  ?improper specimen collection/handling, submission of specimen other ?than nasopharyngeal swab, presence of viral mutation(s) within the ?areas targeted by this assay, and inadequate number of viral ?copies(<138 copies/mL). A negative result must be combined with ?clinical observations, patient history, and epidemiological ?information. The expected result is Negative. ? ?Fact Sheet for Patients:  ?EntrepreneurPulse.com.au ? ?Fact Sheet for Healthcare Providers:  ?IncredibleEmployment.be ? ?This  test is no t yet approved or cleared by the [place] FDA and  ?has been authorized for detection and/or diagnosis of SARS-CoV-2 by ?FDA under an Emergency Use Authorization (EUA). This EUA will remai

## 2021-08-14 NOTE — Progress Notes (Signed)
Slept through the night. Awakened minimally for vital signs, tolerated BiPAP majority of the night. Weight down see flowsheets. Awake now and on 2L [place] per request, talking on the phone. Safety maintained. Call bell kept with in reach.  ?

## 2021-08-14 NOTE — Progress Notes (Addendum)
?   08/14/21 1600  ?Clinical Encounter Type  ?Visited With Patient  ?Visit Type Initial  ?Referral From Nurse  ?Consult/Referral To Chaplain  ? ?Chaplain responded to nurse consult. Chaplain provided support and reflective listening. Patient appreciated Kelley visit. Spoke to patient about AD paperwork and he declined completing it at this time. ?

## 2021-08-14 NOTE — Consult Note (Signed)
PHARMACY CONSULT NOTE ? ?Pharmacy Consult for Electrolyte Monitoring and Replacement  ? ?Recent Labs: ?Potassium (mmol/L)  ?Date Value  ?08/14/2021 3.8  ?04/15/2014 3.9  ? ?Magnesium (mg/dL)  ?Date Value  ?08/14/2021 2.0  ?08/21/2012 1.9  ? ?Calcium (mg/dL)  ?Date Value  ?08/14/2021 8.3 (L)  ? ?Calcium, Total (mg/dL)  ?Date Value  ?04/15/2014 8.3 (L)  ? ?Albumin (g/dL)  ?Date Value  ?08/07/2021 3.3 (L)  ?04/15/2014 2.9 (L)  ? ?Phosphorus (mg/dL)  ?Date Value  ?08/14/2021 2.2 (L)  ? ?Sodium (mmol/L)  ?Date Value  ?08/14/2021 138  ?03/15/2020 145 (H)  ?04/15/2014 140  ? ?Corrected calcium 9.1 mg/dL ? ?Assessment: ?66 yo M, with medical history of diastolic CHF (EF 60 - 84%, grade III DD, 11/22), COPD on home O2 2L, HTN, OSA on CPAP, PVD, HLD, former smoker, morbidly obese (BMI 49.65), GI bleeding, CKD 3, Iron deficiency anemia, presented to the ED on 4/24 for SOB and admitted due to acute exacerbation on chronic diastolic CHF with  leg edema and pulmonary vascular congestion on X-ray. ? ?Diuretics: torsemide 20 mg daily ?Renal function stable.  ? ?Goal of Therapy:  ?K 4.0 - 5.1 mmol/L ?Mg 2.0 - 2.4 mg/dL ?Other electrolytes within normal limits ?  ?Plan:  ?Kcl 40 mEq x 1 ?K phos neutral 250 mg every 4 hours x 4 ?Follow up electrolytes in AM ? ? ?Wynelle Cleveland, PharmD ?Pharmacy Resident  ?08/14/2021 ?4:04 PM ? ?

## 2021-08-15 LAB — BASIC METABOLIC PANEL
Anion gap: 5 (ref 5–15)
BUN: 32 mg/dL — ABNORMAL HIGH (ref 8–23)
CO2: 33 mmol/L — ABNORMAL HIGH (ref 22–32)
Calcium: 8.4 mg/dL — ABNORMAL LOW (ref 8.9–10.3)
Chloride: 102 mmol/L (ref 98–111)
Creatinine, Ser: 1.1 mg/dL (ref 0.61–1.24)
GFR, Estimated: >60 mL/min (ref >60)
Glucose, Bld: 86 mg/dL (ref 70–99)
Potassium: 4.3 mmol/L (ref 3.5–5.1)
Sodium: 140 mmol/L (ref 135–145)

## 2021-08-15 LAB — CBC WITH DIFFERENTIAL/PLATELET
Abs Immature Granulocytes: 0.03 10*3/uL (ref 0.00–0.07)
Basophils Absolute: 0 10*3/uL (ref 0.0–0.1)
Basophils Relative: 0 %
Eosinophils Absolute: 0.1 10*3/uL (ref 0.0–0.5)
Eosinophils Relative: 1 %
HCT: 41.2 % (ref 39.0–52.0)
Hemoglobin: 11.6 g/dL — ABNORMAL LOW (ref 13.0–17.0)
Immature Granulocytes: 0 %
Lymphocytes Relative: 19 %
Lymphs Abs: 1.5 10*3/uL (ref 0.7–4.0)
MCH: 23.4 pg — ABNORMAL LOW (ref 26.0–34.0)
MCHC: 28.2 g/dL — ABNORMAL LOW (ref 30.0–36.0)
MCV: 83.1 fL (ref 80.0–100.0)
Monocytes Absolute: 0.8 10*3/uL (ref 0.1–1.0)
Monocytes Relative: 10 %
Neutro Abs: 5.6 10*3/uL (ref 1.7–7.7)
Neutrophils Relative %: 70 %
Platelets: 139 10*3/uL — ABNORMAL LOW (ref 150–400)
RBC: 4.96 MIL/uL (ref 4.22–5.81)
RDW: 16 % — ABNORMAL HIGH (ref 11.5–15.5)
WBC: 8 10*3/uL (ref 4.0–10.5)
nRBC: 0 % (ref 0.0–0.2)

## 2021-08-15 LAB — BRAIN NATRIURETIC PEPTIDE: B Natriuretic Peptide: 11.4 pg/mL (ref 0.0–100.0)

## 2021-08-15 LAB — GLUCOSE, CAPILLARY
Glucose-Capillary: 94 mg/dL (ref 70–99)
Glucose-Capillary: 114 mg/dL — ABNORMAL HIGH (ref 70–99)

## 2021-08-15 LAB — MAGNESIUM: Magnesium: 2.1 mg/dL (ref 1.7–2.4)

## 2021-08-15 LAB — PHOSPHORUS: Phosphorus: 3.9 mg/dL (ref 2.5–4.6)

## 2021-08-15 MED ORDER — TORSEMIDE 20 MG PO TABS: 20.0000 mg | ORAL_TABLET | Freq: Every day | ORAL | 0 refills | Status: DC

## 2021-08-15 MED ORDER — FINASTERIDE 5 MG PO TABS: 5.0000 mg | ORAL_TABLET | Freq: Every day | ORAL | 0 refills | Status: AC

## 2021-08-15 MED ORDER — CARVEDILOL 3.125 MG PO TABS: 3.1250 mg | ORAL_TABLET | Freq: Two times a day (BID) | ORAL | 0 refills | Status: DC

## 2021-08-15 MED ORDER — VITAMIN D (ERGOCALCIFEROL) 1.25 MG (50000 UNIT) PO CAPS: 50000.0000 [IU] | ORAL_CAPSULE | ORAL | 0 refills | Status: DC

## 2021-08-15 NOTE — Consult Note (Signed)
PHARMACY CONSULT NOTE ? ?Pharmacy Consult for Electrolyte Monitoring and Replacement  ? ?Recent Labs: ?Potassium (mmol/L)  ?Date Value  ?08/15/2021 4.3  ?04/15/2014 3.9  ? ?Magnesium (mg/dL)  ?Date Value  ?08/15/2021 2.1  ?08/21/2012 1.9  ? ?Calcium (mg/dL)  ?Date Value  ?08/15/2021 8.4 (L)  ? ?Calcium, Total (mg/dL)  ?Date Value  ?04/15/2014 8.3 (L)  ? ?Albumin (g/dL)  ?Date Value  ?08/07/2021 3.3 (L)  ?04/15/2014 2.9 (L)  ? ?Phosphorus (mg/dL)  ?Date Value  ?08/15/2021 3.9  ? ?Sodium (mmol/L)  ?Date Value  ?08/15/2021 140  ?03/15/2020 145 (H)  ?04/15/2014 140  ? ?Corrected calcium 9.1 mg/dL ? ?Assessment: ?66 yo M, with medical history of diastolic CHF (EF 60 - 35%, grade III DD, 11/22), COPD on home O2 2L, HTN, OSA on CPAP, PVD, HLD, former smoker, morbidly obese (BMI 49.65), GI bleeding, CKD 3, Iron deficiency anemia, presented to the ED on 4/24 for SOB and admitted due to acute exacerbation on chronic diastolic CHF with  leg edema and pulmonary vascular congestion on X-ray. ? ?Diuretics: torsemide 20 mg daily ?Renal function stable.  ? ?Goal of Therapy:  ?K 4.0 - 5.1 mmol/L ?Mg 2.0 - 2.4 mg/dL ?Other electrolytes within normal limits ?  ?Plan:  ?All electrolytes are within NL. No replacement indicated today. ? ?Gary [name] PharmD, BCPS ?08/15/2021 7:59 AM ? ? ?

## 2021-08-15 NOTE — Plan of Care (Signed)
Discharge summary noted, and patient is working towards discharge home. Upon discharge, would recommend outpatient palliative to continue conversations.  ?

## 2021-08-15 NOTE — Progress Notes (Signed)
?   08/15/21 1500  ?Clinical Encounter Type  ?Visited With Patient and family together  ?Visit Type Follow-up  ?Referral From Nurse  ?Consult/Referral To Chaplain  ? ?Chaplain visited with patient and provided compassionate presence and reflective listening as he spoke about hospital stay. Patient has support from his girlfriend. Patient does not want to complete AD paperwork at this time. Chaplain left paperwork for patient to consider in the future if he would like to. Patient and girlfriend appreciated Chaplain visit. ?

## 2021-08-15 NOTE — Progress Notes (Signed)
? ?  Heart Failure Nurse Navigator Note ? ?Met with patient today, he was lying in bed in no acute distress. ? ?Prior to this admission patient had been hospitalized on April 6 and discharged on April 9 this hospitalization was for respiratory failure.  In discussing this with the patient he feels that he was discharged too early and was the reasoning for having to return to the emergency room on April 24.  Questioned why he did not notify someone sooner, besides his doctors he also is followed by Nile Dear with [facility], he did not have an answer. ? ?Goes  on to state that he feels that he is taking way too many medications.  I asked him  if when he goes home, does he sit down with his discharge medication list and compare it to the bottles of medication that he has and he stated no.  Recommended that he sit down upon discharge from this hospitalization and go over all his medications and anything that is not on his list that he should put away in another cupboard.  Voices understanding.  Also recommended setting up his medications in a weekly pillbox. ? ?By teach back method went over weights to report along with changes in symptoms.  Expect fair compliance. ? ?He has a follow-up appointment in the outpatient heart failure clinic this Thursday, May 4.  Instructed to bring his medications. ? ?Pricilla Riffle RN [facility] ?

## 2021-08-15 NOTE — Progress Notes (Signed)
Discharge instructions provided to patient. All medications, follow up appointments, and discharge instructions discussed. IV out. Monitor off CCMD notified. Discharging home with family. ?Era Bumpers, RN  ?

## 2021-08-15 NOTE — Discharge Summary (Signed)
Gary [name] WCB:[id number] DOB: [birth year] DOA: 08/07/2021 ? ?PCP: [facility], [facility] ? ?Admit date: 08/07/2021 ?Discharge date: 08/15/2021 ? ?Admitted From: Home ?Disposition: Home ? ?Recommendations for Outpatient Follow-up:  ?Follow up with PCP in 1 week ?Please obtain BMP/CBC in one week ?Please follow up with pulmonology Dr. Margretta Sidle in 1 week ?Cardiology and heart failure in 1 week ? ? ? ? ?Discharge Condition:Stable ?CODE STATUS: Full ?Diet recommendation: Heart Healthy ?Brief/Interim Summary: ?Per [facility]:Gary [name] is a 66 year old male with a past medical history significant for HF PEF (LVEF 60 to 65%, grade 3 DD), COPD on 2 L supplemental O2 at baseline, morbid obesity, OSA on CPAP, OHS, hypertension, PVD, hyperlipidemia, CKD stage III, iron deficiency anemia who presented to [facility] ED on 08/07/2021 due to complaints of shortness of breath.Admitted with Acute Metabolic Encephalopathy due to CO2 narcosis, Acute on Chronic Hypercapnic Respiratory Failure in the setting of Acute Decompensated HFpEF, AECOPD OSA & OHS.  Failed trial of BiPAP requiring intubation and mechanical ventilation.  He was admitted to the ICU service. ? ?Patient is currently somnolent and on BiPAP and unable to participate in history and no family is present currently, therefore history is obtained from chart review.  Per ED and nursing notes, he presented with a 2-day history of progressive shortness of breath along with associated dry cough,  worsening lower extremity edema, and increasing lethargy.  He reportedly slept from Saturday morning until Sunday evening, which he was not compliant with CPAP at that time or taking medications over that time period. He denied chest pain, palpitations, nausea, vomiting, abdominal pain, dysuria. He reported that he has never really felt better since after being discharged from the hospital last admission on 4/9. ?  ?In the ED he was noted to be lethargic, but arousable (oriented x3 when  aroused).  He was placed on BiPAP. ?  ?Of note he was recently hospitalized from 07/20/21 to 07/23/21 with acute on chronic hypoxic & hypercapnic respiratory failure with acute metabolic encephalopathy due to CO2 narcosis in the setting of OSA/OHS/noncompliance with CPAP, pulmonary hypertension, and suspected pulmonary edema requiring BiPAP.  This is his 3rd hospitalized in 5 months for the same issue.  There is some concern regarding compliance with CPAP at home.   ? ?ED Course: ?Initial Vital Signs: Temperature 99.1 ?F orally, respiratory rate 19, pulse 84, blood pressure 117/79, SPO2 96% on 2 L nasal cannula ?Significant Labs: Bicarbonate 41, glucose 114, BUN 26, creatinine 1.39, BNP 120, high-sensitivity troponin 7, WBC 7.3, hemoglobin 12.2, hematocrit 46.9 ?Venous blood gas: pH 7-6/PCO2 110/PO2 65/bicarbonate 49.4 ?COVID-19 PCR negative ?Imaging ?Chest X-ray>>IMPRESSION: ?Stable cardiomegaly with central pulmonary vascular congestion and ?possible bilateral perihilar edema ? ?Hospital course: ?He was admitted to the ICU.  He was intubated.  On 4/26 he was extubated.  He was receiving IV diuretics.  He needed mandatory BiPAP at night.  His encephalopathy resolved.  He was then transferred to progressive care to the hospitalist service.  He has diuresed well.  He has trilogy at home but due to his noncompliance or planning to pick up his machine.  In order for him to continue with his trilogy he needs another sleep study.  He will need to follow-up with his pulmonologist for another order sleep study.  He is stable for discharge today. ? ?Acute on Chronic Hypercapnic Respiratory Failure in the setting of Acute Decompensated HFpEF, AECOPD OSA & OHS ?PMHx: COPD (on 2L supplemental O2 at baseline), Noncompliance with CPAP ?-EXTUBATED 4/26 ?-  Supplemental O2 as needed to maintain O2 sats 88 to 92% ?-Bipap while sleeping ?Has Trilogy at home but due to noncompliance , the company plans to pickup the [place]. If they do,  they will require another sleep study inorder for him to obtain it. Needs to f/u with pccm for this. ?Continue home inhalers ?Initially here was started on iv steroids and iv lasix.  ?Can continue torsemide ?F/u with cardiology ? ?  ?Acute Decompensated HFpEF (LVEF 60-65%, Grade I DD) ?PMHx: Hypertension, hyperlipidemia ?Improving clinically. ?On home 02 2-3L 24hrs/ day per pt. ?Continue statins and torsemide  ?follow-up with cardiology ?Decrease home carvedilol dose to 3.125 mg twice daily with parameters ? ?  ?  ?Acute Metabolic Encephalopathy in setting of CO2 Narcosis~improving  ?-Avoid sedating meds  ?-UDS negative ?-BiPAP qhs and any time while sleeping ?Clinically has improved ?Has trilogy at home please see above ?  ?AKI on Chronic Kidney Disease Stage IIIa (baseline Creatinine around 1.3-1.5) ?Improved  ?Avoid nephrotoxic meds  ?F/u pcp for monitoring ?  ?  ?  ? ? ?Discharge Diagnoses:  ?Principal Problem: ?  Acute on chronic respiratory failure with hypercapnia ([name]) ?Active Problems: ?  Acute on chronic diastolic CHF (congestive heart failure) ([place]) ?  COPD (chronic obstructive pulmonary disease) ([place]) ?  Acute metabolic encephalopathy ?  Thrombocytopenia ([place]) ?  Obesity, Class III, BMI 40-49.9 (morbid obesity) ([place]) ?  HTN (hypertension) ?  Obstructive sleep apnea ?  Chronic kidney disease, stage 3a ([place]) ?  HLD (hyperlipidemia) ?  Iron deficiency anemia ? ? ? ?Discharge Instructions ? ?Discharge Instructions   ? ? Call MD for:  difficulty breathing, headache or visual disturbances   Complete by: As directed ?  ? Diet - low sodium heart healthy   Complete by: As directed ?  ? Increase activity slowly   Complete by: As directed ?  ? ?  ? ?Allergies as of 08/15/2021   ?No Known Allergies ?  ? ?  ?Medication List  ?  ? ?STOP taking these medications   ? ?acetaminophen 500 MG tablet ?Commonly known as: TYLENOL ?  ? ?  ? ?TAKE these medications   ? ?aspirin EC 81 MG tablet ?Take 81 mg by mouth daily. Swallow  whole. ?  ?budesonide 0.25 MG/2ML nebulizer solution ?Commonly known as: PULMICORT ?Take 2 mLs (0.25 mg total) by nebulization 2 (two) times daily. ?  ?carvedilol 3.125 MG tablet ?Commonly known as: COREG ?Take 1 tablet (3.125 mg total) by mouth 2 (two) times daily with a meal. Skip dose if systolic BP less than 001 mmHg and/or heart rate less than 65 ?What changed:  ?medication strength ?how much to take ?when to take this ?additional instructions ?  ?dapagliflozin propanediol 10 MG Tabs tablet ?Commonly known as: [place] ?Take 1 tablet (10 mg total) by mouth daily before breakfast. ?  ?ferrous sulfate 325 (65 FE) MG tablet ?Take 1 tablet (325 mg total) by mouth 2 (two) times daily with a meal. ?  ?finasteride 5 MG tablet ?Commonly known as: PROSCAR ?Take 1 tablet (5 mg total) by mouth daily. ?Start taking on: Aug 16, 2021 ?  ?ipratropium-albuterol 0.5-2.5 (3) MG/3ML Soln ?Commonly known as: DUONEB ?Take 3 mLs by nebulization every 6 (six) hours. ?What changed:  ?when to take this ?reasons to take this ?  ?Combivent Respimat 20-100 MCG/ACT Aers respimat ?Generic drug: Ipratropium-Albuterol ?Inhale 2 puffs into the lungs in the morning and at bedtime. ?What changed: Another medication with the same name was  changed. Make sure you understand how and when to take each. ?  ?pantoprazole 40 MG tablet ?Commonly known as: PROTONIX ?Take 1 tablet (40 mg total) by mouth daily. ?  ?potassium chloride 10 MEQ tablet ?Commonly known as: KLOR-CON M ?Take 1 tablet (10 mEq total) by mouth daily. ?  ?simvastatin 10 MG tablet ?Commonly known as: ZOCOR ?Take 1 tablet (10 mg total) by mouth daily at 6 PM. ?  ?torsemide 20 MG tablet ?Commonly known as: DEMADEX ?Take 1 tablet (20 mg total) by mouth daily. ?What changed: how much to take ?  ?Vitamin D (Ergocalciferol) 1.25 MG (50000 UNIT) Caps capsule ?Commonly known as: DRISDOL ?Take 1 capsule (50,000 Units total) by mouth every 7 (seven) days. ?  ? ?  ? ? Follow-up Information   ? ?  [name], MD Follow up in 1 week(s).   ?Specialty: Pulmonary Disease ?Contact information: ?[address] ?[place] [address] ?[phone] ? ? ?  ?  ? ? Darylene Price A, FNP Follow up in 1 w

## 2021-08-15 NOTE — TOC Initial Note (Addendum)
Transition of Care (TOC) - Initial/Assessment Note  ? ? ?Patient Details  ?Name: Gary [name] ?MRN: [id number] ?Date of Birth: 1955/09/06 ? ?Transition of Care (TOC) CM/SW Contact:    ?Laurena Slimmer, RN ?Phone Number: ?08/15/2021, 2:25 PM ? ?Clinical Narrative:                 ?TOC consulted for heart failure screen. Heart failure RN Delmar Landau has been informed.  ?Attending MD inquired about Trilogy. Per [employer] representative, Andree Coss patient is well known to their service. Patient has a home Trilogy and is noncompliant with Trilogy and has been given multiple chances to comply. Compliance for Trilogy is required for billing per representative. Patient has not used his Trilogy machine more than four times this past month. [employer] is scheduled to retrieve the Trilogy. Per representative a BIPAP may be ordered after completion of outpatient sleep study. TOC signing off. ? ?Expected Discharge Plan: Home/Self Care ?Barriers to Discharge: Continued Medical Work up ? ? ?Patient Goals and CMS Choice ?Patient states their goals for this hospitalization and ongoing recovery are:: unable to voice goals intubated ?  ?  ? ?Expected Discharge Plan and Services ?Expected Discharge Plan: Home/Self Care ?  ?Discharge Planning Services: CM Consult ?  ?Living arrangements for the past 2 months: [place] ?Expected Discharge Date: 08/15/21               ?  ?  ?  ?  ?  ?HH Arranged: NA ?[place] Agency: NA ?  ?  ?  ? ?Prior Living Arrangements/Services ?Living arrangements for the past 2 months: [place] ?  ?Patient language and need for interpreter reviewed:: Yes ?       ?Need for Family Participation in Patient Care: Yes (Comment) ?Care giver support system in place?: Yes (comment) ?Current home services: DME (oxygen, walker, cane, cpap) ?Criminal Activity/Legal Involvement Pertinent to Current Situation/Hospitalization: No - Comment as needed ? ?Activities of Daily Living ?Home Assistive Devices/Equipment: CPAP ?   ? ?Permission Sought/Granted ?  ?Permission granted to share information with : No ?   ?   ?   ?   ? ?Emotional Assessment ?Appearance:: Appears older than stated age ?Attitude/Demeanor/Rapport: Intubated (Following Commands or Not Following Commands) ?Affect (typically observed): Unable to Assess ?  ?Alcohol / Substance Use: Not Applicable ?Psych Involvement: No (comment) ? ?Admission diagnosis:  Acute pulmonary edema ([place]) [J81.0] ?Acute on chronic diastolic CHF (congestive heart failure) ([place]) [I50.33] ?Acute on chronic respiratory failure with hypoxia ([name]) [J96.21] ?Acute on chronic respiratory failure with hypoxia and hypercapnia ([name]) [J96.21, J96.22] ?Patient Active Problem List  ? Diagnosis Date Noted  ? Acute on chronic respiratory failure with hypercapnia ([place]) 08/07/2021  ? COPD (chronic obstructive pulmonary disease) ([place])   ? HLD (hyperlipidemia)   ? Iron deficiency anemia   ? Chronic kidney disease, stage 3a ([place]) 07/22/2021  ? Acute cardiac pulmonary edema ([place]) 07/22/2021  ? Acute on chronic respiratory failure with hypoxia and hypercapnia ([place]) 07/20/2021  ? Chronic obstructive pulmonary disease ([name])   ? Positive D dimer   ? CHF exacerbation ([place]) 05/03/2021  ? Acute CHF (congestive heart failure) ([place]) 04/04/2021  ? Weakness   ? Pneumonia 03/11/2021  ? Acute respiratory failure with hypoxia and hypercapnia ([place]) 03/11/2021  ? (HFpEF) heart failure with preserved ejection fraction ([place])   ? Chronic respiratory failure with hypoxia ([name])   ? Hyperlipidemia 02/19/2021  ? Acute respiratory distress syndrome (ARDS) due to COVID-19 virus [facility])   ?  Acute on chronic respiratory failure ([place]) 02/18/2021  ? Spondylosis without myelopathy or radiculopathy, lumbosacral region 11/15/2020  ? Lumbosacral facet joint syndrome (Bilateral) 10/31/2020  ? Generalized osteoarthritis of multiple sites 10/31/2020  ? AKI (acute kidney injury) ([place]) 03/04/2020  ? Hyperkalemia 03/04/2020  ? COPD with acute exacerbation  ([place]) 03/02/2020  ? Marijuana use 01/17/2020  ? Elevated BUN 01/17/2020  ? Hypocalcemia 01/17/2020  ? Hypochloremia 01/17/2020  ? Low thyroid stimulating hormone (TSH) level 01/17/2020  ? Vitamin D deficiency 01/17/2020  ? Elevated sed rate 01/17/2020  ? Thrombocytopenia ([place]) 01/17/2020  ? Elevated hematocrit 01/17/2020  ? Elevated brain natriuretic peptide (BNP) level 01/17/2020  ? Positive hepatitis C antibody test 01/17/2020  ? 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 mass index (BMI) of 50.0 to 59.9 in adult [facility]) 01/17/2020  ? Osteoarthritis of glenohumeral joints (Bilateral) 01/17/2020  ? Osteoarthritis of AC (acromioclavicular) joints (Bilateral) 01/17/2020  ? Lumbar Grade 1 Anterolisthesis of L4 over L5 (3 mm) (stable) 01/17/2020  ? Osteoarthritis of lumbar spine (L3 through S1) 01/17/2020  ? Lumbar facet osteoarthritis (Multilevel) (Bilateral) 01/17/2020  ? Osteoarthritis of hips (Bilateral) 01/17/2020  ? Osteoarthritis of knees (Bilateral) 01/17/2020  ? Tricompartment osteoarthritis of knees (Bilateral) 01/17/2020  ? Patellofemoral arthralgia of knees (Bilateral) 01/17/2020  ? Plantar and Achilles calcaneal spurs (Bilateral) 01/17/2020  ? Ankle edema (Bilateral) 01/17/2020  ? Traumatic osteoarthritis of hands (Bilateral) 01/17/2020  ? Polysubstance abuse ([place]) 08/25/2019  ? Acute metabolic encephalopathy 27/25/3664  ? Chronic pain syndrome 07/14/2019  ? Pharmacologic therapy 07/14/2019  ? Disorder of skeletal system 07/14/2019  ? Problems influencing health status 07/14/2019  ? Chronic low back pain (1ry area of Pain) (Bilateral) (L>R) w/o sciatica 07/14/2019  ? Chronic shoulder pain (2ry area of Pain) (Bilateral) (L>R) 07/14/2019  ? Chronic feet pain (3ry area of Pain) (Bilateral) 07/14/2019  ? Chronic knee pain (4th area of Pain) (Bilateral) (L>R) 07/14/2019  ? Chronic hip pain (5th area of Pain) (Bilateral) (L>R) 07/14/2019  ? Chronic hand pain (6th area of Pain) (Bilateral) (L>R)  07/14/2019  ? Lumbar facet arthropathy 07/14/2019  ? DDD (degenerative disc disease), lumbosacral 07/14/2019  ? Closed fracture of shaft of humerus, sequela (Left) 07/14/2019  ? DDD (degenerative disc disease), cervical 07/14/2019  ? Tobacco use 05/04/2017  ? Lymphedema 05/04/2017  ? CHF (congestive heart failure) ([place]) 04/12/2017  ? Acute on chronic diastolic CHF (congestive heart failure) ([place]) 02/28/2015  ? Obstructive sleep apnea 02/28/2015  ? Acute on chronic diastolic congestive heart failure ([place])   ? Acute on chronic respiratory failure with hypoxia ([name])   ? Edema of both feet   ? Closed fracture of scapula, sequela (Left) 04/01/2014  ? Obesity, Class III, BMI 40-49.9 (morbid obesity) ([place]) 04/01/2014  ? HTN (hypertension) 04/01/2014  ? Multiple rib fractures 03/27/2014  ? ?PCP:  [facility] ?Pharmacy:   ?[place], [place] [address] ?[place] [address] ?Phone: [phone] Fax: [phone] ? ? ? ? ?Social Determinants of Health (SDOH) Interventions ?  ? ?Readmission Risk Interventions ? ?  05/04/2021  ?  1:26 PM 04/07/2021  ?  1:39 PM 03/12/2021  ? 11:54 AM  ?Readmission Risk Prevention Plan  ?Transportation Screening Complete Complete Complete  ?PCP or Specialist Appt within 3-5 Days  Complete Complete  ?[place] or Home Care Consult   Complete  ?Social Work Consult for [place] Planning/Counseling   Complete  ?Palliative Care Screening  Not Applicable  Not Applicable  ?Medication Review Press photographer) Complete Complete Complete  ?PCP or Specialist appointment within 3-5 days of discharge Complete    ?[place] or Home Care Consult Complete    ?SW Recovery Care/Counseling Consult Complete    ?Palliative Care Screening Not Applicable    ?[place] Not Applicable    ? ? ? ?

## 2021-08-17 ENCOUNTER — Ambulatory Visit: Payer: Medicaid Other | Admitting: Family

## 2021-08-21 ENCOUNTER — Other Ambulatory Visit: Payer: Self-pay | Admitting: Nephrology

## 2021-08-21 ENCOUNTER — Ambulatory Visit: Payer: Medicaid Other | Attending: Family | Admitting: Family

## 2021-08-21 ENCOUNTER — Encounter: Payer: Self-pay | Admitting: Family

## 2021-08-21 VITALS — BP 115/79 | HR 90 | Resp 18 | Ht 75.0 in | Wt 378.0 lb

## 2021-08-21 DIAGNOSIS — Z87891 Personal history of nicotine dependence: Secondary | ICD-10-CM | POA: Insufficient documentation

## 2021-08-21 DIAGNOSIS — Z7982 Long term (current) use of aspirin: Secondary | ICD-10-CM | POA: Insufficient documentation

## 2021-08-21 DIAGNOSIS — Z9981 Dependence on supplemental oxygen: Secondary | ICD-10-CM | POA: Insufficient documentation

## 2021-08-21 DIAGNOSIS — J449 Chronic obstructive pulmonary disease, unspecified: Secondary | ICD-10-CM | POA: Insufficient documentation

## 2021-08-21 DIAGNOSIS — Z7984 Long term (current) use of oral hypoglycemic drugs: Secondary | ICD-10-CM | POA: Diagnosis not present

## 2021-08-21 DIAGNOSIS — I10 Essential (primary) hypertension: Secondary | ICD-10-CM | POA: Diagnosis not present

## 2021-08-21 DIAGNOSIS — N179 Acute kidney failure, unspecified: Secondary | ICD-10-CM

## 2021-08-21 DIAGNOSIS — Z7951 Long term (current) use of inhaled steroids: Secondary | ICD-10-CM | POA: Insufficient documentation

## 2021-08-21 DIAGNOSIS — Z79899 Other long term (current) drug therapy: Secondary | ICD-10-CM | POA: Insufficient documentation

## 2021-08-21 DIAGNOSIS — G4733 Obstructive sleep apnea (adult) (pediatric): Secondary | ICD-10-CM

## 2021-08-21 DIAGNOSIS — I5032 Chronic diastolic (congestive) heart failure: Secondary | ICD-10-CM | POA: Diagnosis present

## 2021-08-21 DIAGNOSIS — I110 Hypertensive heart disease with heart failure: Secondary | ICD-10-CM | POA: Diagnosis not present

## 2021-08-21 DIAGNOSIS — Z720 Tobacco use: Secondary | ICD-10-CM | POA: Diagnosis not present

## 2021-08-21 NOTE — Patient Instructions (Signed)
Continue weighing daily and call for an overnight weight gain of 3 pounds or more or a weekly weight gain of more than 5 pounds.   If you have voicemail, please make sure your mailbox is cleaned out so that we may leave a message and please make sure to listen to any voicemails.     
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
## 2021-08-22 ENCOUNTER — Encounter: Payer: Self-pay | Admitting: Family

## 2021-08-28 ENCOUNTER — Other Ambulatory Visit (HOSPITAL_COMMUNITY): Payer: Self-pay

## 2021-08-28 NOTE — Progress Notes (Signed)
Had a home appt scheduled with Kasson this morning.  When arrived no one home.  Contacted him by phone and he states he was not at home, he is staying with his sister for a couple of days.  He states will be back tomorrow.  Will try again this week to make a home visit.  Will visit for heart failure, diet and medication management.  ? ?Onelia Cadmus ?[place] EMT-Paramedic ?[phone] ?

## 2021-09-04 ENCOUNTER — Ambulatory Visit: Payer: Medicaid Other | Attending: Nephrology
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
## 2021-09-06 ENCOUNTER — Ambulatory Visit: Payer: Medicaid Other | Admitting: Cardiovascular Disease

## 2021-09-06 DIAGNOSIS — G4733 Obstructive sleep apnea (adult) (pediatric): Secondary | ICD-10-CM

## 2021-09-06 DIAGNOSIS — I10 Essential (primary) hypertension: Secondary | ICD-10-CM

## 2021-09-06 DIAGNOSIS — Z91148 Patient's other noncompliance with medication regimen for other reason: Secondary | ICD-10-CM

## 2021-09-06 DIAGNOSIS — Z720 Tobacco use: Secondary | ICD-10-CM

## 2021-09-06 DIAGNOSIS — J9611 Chronic respiratory failure with hypoxia: Secondary | ICD-10-CM

## 2021-09-06 DIAGNOSIS — I890 Lymphedema, not elsewhere classified: Secondary | ICD-10-CM

## 2021-09-06 DIAGNOSIS — I5032 Chronic diastolic (congestive) heart failure: Secondary | ICD-10-CM

## 2021-09-07 ENCOUNTER — Other Ambulatory Visit (HOSPITAL_COMMUNITY): Payer: Self-pay

## 2021-09-07 ENCOUNTER — Encounter: Payer: Self-pay | Admitting: Cardiovascular Disease

## 2021-09-07 NOTE — Progress Notes (Signed)
Had a home visit scheduled but he is not home.  Was able to make contact by phone and he said he forgot again.  Will keep trying but he states doing well.  He is using his bipap and eating better.  He states they are delivering the meals from [employer].   He states has all his medications.  He has been showing for all his appts. Have been in touch with [employer] about getting him a ramp or rails for his home.   [place] [phone]

## 2021-09-19 ENCOUNTER — Other Ambulatory Visit: Payer: Self-pay

## 2021-09-19 ENCOUNTER — Inpatient Hospital Stay: Payer: Medicaid Other

## 2021-09-19 ENCOUNTER — Emergency Department: Payer: Medicaid Other

## 2021-09-19 ENCOUNTER — Inpatient Hospital Stay
Admission: EM | Admit: 2021-09-19 | Discharge: 2021-12-13 | DRG: 003 | Disposition: A | Discharge status: Home / Self Care | Payer: Medicaid Other | Attending: Internal Medicine | Admitting: Internal Medicine

## 2021-09-19 DIAGNOSIS — J9622 Acute and chronic respiratory failure with hypercapnia: Secondary | ICD-10-CM | POA: Diagnosis present

## 2021-09-19 DIAGNOSIS — K5731 Diverticulosis of large intestine without perforation or abscess with bleeding: Secondary | ICD-10-CM | POA: Diagnosis not present

## 2021-09-19 DIAGNOSIS — Z7951 Long term (current) use of inhaled steroids: Secondary | ICD-10-CM

## 2021-09-19 DIAGNOSIS — L89892 Pressure ulcer of other site, stage 2: Secondary | ICD-10-CM | POA: Diagnosis not present

## 2021-09-19 DIAGNOSIS — J440 Chronic obstructive pulmonary disease with acute lower respiratory infection: Secondary | ICD-10-CM | POA: Diagnosis present

## 2021-09-19 DIAGNOSIS — Z20822 Contact with and (suspected) exposure to covid-19: Secondary | ICD-10-CM | POA: Diagnosis present

## 2021-09-19 DIAGNOSIS — G4730 Sleep apnea, unspecified: Secondary | ICD-10-CM | POA: Diagnosis present

## 2021-09-19 DIAGNOSIS — Z833 Family history of diabetes mellitus: Secondary | ICD-10-CM

## 2021-09-19 DIAGNOSIS — Z7189 Other specified counseling: Secondary | ICD-10-CM | POA: Diagnosis not present

## 2021-09-19 DIAGNOSIS — Z79899 Other long term (current) drug therapy: Secondary | ICD-10-CM

## 2021-09-19 DIAGNOSIS — E662 Morbid (severe) obesity with alveolar hypoventilation: Secondary | ICD-10-CM | POA: Diagnosis present

## 2021-09-19 DIAGNOSIS — J151 Pneumonia due to Pseudomonas: Secondary | ICD-10-CM | POA: Diagnosis present

## 2021-09-19 DIAGNOSIS — J9621 Acute and chronic respiratory failure with hypoxia: Secondary | ICD-10-CM | POA: Diagnosis present

## 2021-09-19 DIAGNOSIS — Z431 Encounter for attention to gastrostomy: Secondary | ICD-10-CM

## 2021-09-19 DIAGNOSIS — E8721 Acute metabolic acidosis: Secondary | ICD-10-CM | POA: Diagnosis not present

## 2021-09-19 DIAGNOSIS — R739 Hyperglycemia, unspecified: Secondary | ICD-10-CM | POA: Diagnosis present

## 2021-09-19 DIAGNOSIS — Z515 Encounter for palliative care: Secondary | ICD-10-CM

## 2021-09-19 DIAGNOSIS — M069 Rheumatoid arthritis, unspecified: Secondary | ICD-10-CM | POA: Diagnosis present

## 2021-09-19 DIAGNOSIS — R1310 Dysphagia, unspecified: Secondary | ICD-10-CM | POA: Diagnosis present

## 2021-09-19 DIAGNOSIS — J9602 Acute respiratory failure with hypercapnia: Secondary | ICD-10-CM | POA: Diagnosis not present

## 2021-09-19 DIAGNOSIS — I428 Other cardiomyopathies: Secondary | ICD-10-CM | POA: Diagnosis present

## 2021-09-19 DIAGNOSIS — N179 Acute kidney failure, unspecified: Secondary | ICD-10-CM | POA: Diagnosis not present

## 2021-09-19 DIAGNOSIS — E8729 Other acidosis: Secondary | ICD-10-CM | POA: Diagnosis present

## 2021-09-19 DIAGNOSIS — J9601 Acute respiratory failure with hypoxia: Principal | ICD-10-CM

## 2021-09-19 DIAGNOSIS — I10 Essential (primary) hypertension: Secondary | ICD-10-CM | POA: Diagnosis present

## 2021-09-19 DIAGNOSIS — Z91199 Patient's noncompliance with other medical treatment and regimen due to unspecified reason: Secondary | ICD-10-CM

## 2021-09-19 DIAGNOSIS — R34 Anuria and oliguria: Secondary | ICD-10-CM | POA: Diagnosis not present

## 2021-09-19 DIAGNOSIS — Z87891 Personal history of nicotine dependence: Secondary | ICD-10-CM

## 2021-09-19 DIAGNOSIS — M109 Gout, unspecified: Secondary | ICD-10-CM | POA: Diagnosis present

## 2021-09-19 DIAGNOSIS — L97529 Non-pressure chronic ulcer of other part of left foot with unspecified severity: Secondary | ICD-10-CM | POA: Diagnosis not present

## 2021-09-19 DIAGNOSIS — L8990 Pressure ulcer of unspecified site, unspecified stage: Secondary | ICD-10-CM | POA: Insufficient documentation

## 2021-09-19 DIAGNOSIS — N186 End stage renal disease: Secondary | ICD-10-CM

## 2021-09-19 DIAGNOSIS — N1831 Chronic kidney disease, stage 3a: Secondary | ICD-10-CM | POA: Diagnosis present

## 2021-09-19 DIAGNOSIS — F331 Major depressive disorder, recurrent, moderate: Secondary | ICD-10-CM | POA: Diagnosis not present

## 2021-09-19 DIAGNOSIS — G894 Chronic pain syndrome: Secondary | ICD-10-CM | POA: Diagnosis present

## 2021-09-19 DIAGNOSIS — G4733 Obstructive sleep apnea (adult) (pediatric): Secondary | ICD-10-CM | POA: Diagnosis present

## 2021-09-19 DIAGNOSIS — E870 Hyperosmolality and hypernatremia: Secondary | ICD-10-CM | POA: Diagnosis not present

## 2021-09-19 DIAGNOSIS — D62 Acute posthemorrhagic anemia: Secondary | ICD-10-CM | POA: Diagnosis not present

## 2021-09-19 DIAGNOSIS — E876 Hypokalemia: Secondary | ICD-10-CM | POA: Diagnosis not present

## 2021-09-19 DIAGNOSIS — R339 Retention of urine, unspecified: Secondary | ICD-10-CM | POA: Diagnosis not present

## 2021-09-19 DIAGNOSIS — N281 Cyst of kidney, acquired: Secondary | ICD-10-CM | POA: Diagnosis present

## 2021-09-19 DIAGNOSIS — A046 Enteritis due to Yersinia enterocolitica: Secondary | ICD-10-CM | POA: Diagnosis not present

## 2021-09-19 DIAGNOSIS — Z8616 Personal history of COVID-19: Secondary | ICD-10-CM | POA: Diagnosis not present

## 2021-09-19 DIAGNOSIS — G928 Other toxic encephalopathy: Secondary | ICD-10-CM | POA: Diagnosis present

## 2021-09-19 DIAGNOSIS — Z789 Other specified health status: Secondary | ICD-10-CM | POA: Diagnosis not present

## 2021-09-19 DIAGNOSIS — Z823 Family history of stroke: Secondary | ICD-10-CM

## 2021-09-19 DIAGNOSIS — E781 Pure hyperglyceridemia: Secondary | ICD-10-CM | POA: Diagnosis present

## 2021-09-19 DIAGNOSIS — Z7982 Long term (current) use of aspirin: Secondary | ICD-10-CM

## 2021-09-19 DIAGNOSIS — Z6841 Body Mass Index (BMI) 40.0 and over, adult: Secondary | ICD-10-CM

## 2021-09-19 DIAGNOSIS — E785 Hyperlipidemia, unspecified: Secondary | ICD-10-CM | POA: Diagnosis not present

## 2021-09-19 DIAGNOSIS — J441 Chronic obstructive pulmonary disease with (acute) exacerbation: Secondary | ICD-10-CM | POA: Diagnosis not present

## 2021-09-19 DIAGNOSIS — E875 Hyperkalemia: Secondary | ICD-10-CM | POA: Diagnosis not present

## 2021-09-19 DIAGNOSIS — I5033 Acute on chronic diastolic (congestive) heart failure: Secondary | ICD-10-CM | POA: Diagnosis present

## 2021-09-19 DIAGNOSIS — G9341 Metabolic encephalopathy: Secondary | ICD-10-CM | POA: Diagnosis not present

## 2021-09-19 DIAGNOSIS — I130 Hypertensive heart and chronic kidney disease with heart failure and stage 1 through stage 4 chronic kidney disease, or unspecified chronic kidney disease: Secondary | ICD-10-CM | POA: Diagnosis present

## 2021-09-19 DIAGNOSIS — F419 Anxiety disorder, unspecified: Secondary | ICD-10-CM | POA: Diagnosis present

## 2021-09-19 DIAGNOSIS — I4581 Long QT syndrome: Secondary | ICD-10-CM | POA: Diagnosis not present

## 2021-09-19 DIAGNOSIS — Z992 Dependence on renal dialysis: Secondary | ICD-10-CM

## 2021-09-19 DIAGNOSIS — R040 Epistaxis: Secondary | ICD-10-CM | POA: Diagnosis not present

## 2021-09-19 DIAGNOSIS — R079 Chest pain, unspecified: Secondary | ICD-10-CM | POA: Diagnosis not present

## 2021-09-19 DIAGNOSIS — I959 Hypotension, unspecified: Secondary | ICD-10-CM | POA: Diagnosis present

## 2021-09-19 DIAGNOSIS — D631 Anemia in chronic kidney disease: Secondary | ICD-10-CM | POA: Diagnosis not present

## 2021-09-19 DIAGNOSIS — K921 Melena: Secondary | ICD-10-CM

## 2021-09-19 DIAGNOSIS — F919 Conduct disorder, unspecified: Secondary | ICD-10-CM | POA: Diagnosis present

## 2021-09-19 LAB — CBC WITH DIFFERENTIAL/PLATELET
Abs Immature Granulocytes: 0.07 10*3/uL (ref 0.00–0.07)
Basophils Absolute: 0 10*3/uL (ref 0.0–0.1)
Basophils Relative: 1 %
Eosinophils Absolute: 0.1 10*3/uL (ref 0.0–0.5)
Eosinophils Relative: 1 %
HCT: 48.9 % (ref 39.0–52.0)
Hemoglobin: 12.5 g/dL — ABNORMAL LOW (ref 13.0–17.0)
Immature Granulocytes: 1 %
Lymphocytes Relative: 16 %
Lymphs Abs: 1.2 10*3/uL (ref 0.7–4.0)
MCH: 22.6 pg — ABNORMAL LOW (ref 26.0–34.0)
MCHC: 25.6 g/dL — ABNORMAL LOW (ref 30.0–36.0)
MCV: 88.3 fL (ref 80.0–100.0)
Monocytes Absolute: 0.8 10*3/uL (ref 0.1–1.0)
Monocytes Relative: 11 %
Neutro Abs: 5.4 10*3/uL (ref 1.7–7.7)
Neutrophils Relative %: 70 %
Platelets: 174 10*3/uL (ref 150–400)
RBC: 5.54 MIL/uL (ref 4.22–5.81)
RDW: 17.9 % — ABNORMAL HIGH (ref 11.5–15.5)
Smear Review: DECREASED
WBC: 7.6 10*3/uL (ref 4.0–10.5)
nRBC: 4.1 % — ABNORMAL HIGH (ref 0.0–0.2)

## 2021-09-19 LAB — BLOOD GAS, VENOUS
Acid-Base Excess: 15 mmol/L — ABNORMAL HIGH (ref 0.0–2.0)
Acid-Base Excess: 11.9 mmol/L — ABNORMAL HIGH (ref 0.0–2.0)
Bicarbonate: 47.4 mmol/L — ABNORMAL HIGH (ref 20.0–28.0)
Bicarbonate: 45.5 mmol/L — ABNORMAL HIGH (ref 20.0–28.0)
O2 Saturation: 84.1 %
O2 Saturation: 87.6 %
Patient temperature: 37
Patient temperature: 37
pCO2, Ven: 108 mmHg (ref 44–60)
pCO2, Ven: 122 mmHg (ref 44–60)
pH, Ven: 7.25 (ref 7.25–7.43)
pH, Ven: 7.18 — CL (ref 7.25–7.43)
pO2, Ven: 55 mmHg — ABNORMAL HIGH (ref 32–45)
pO2, Ven: 68 mmHg — ABNORMAL HIGH (ref 32–45)

## 2021-09-19 LAB — PROCALCITONIN: Procalcitonin: 0.29 ng/mL

## 2021-09-19 LAB — URINALYSIS, COMPLETE (UACMP) WITH MICROSCOPIC
Bacteria, UA: NONE SEEN
Bilirubin Urine: NEGATIVE
Glucose, UA: 150 mg/dL — AB
Hgb urine dipstick: NEGATIVE
Ketones, ur: NEGATIVE mg/dL
Leukocytes,Ua: NEGATIVE
Nitrite: NEGATIVE
Protein, ur: NEGATIVE mg/dL
Specific Gravity, Urine: 1.009 (ref 1.005–1.030)
pH: 5 (ref 5.0–8.0)

## 2021-09-19 LAB — URINE DRUG SCREEN, QUALITATIVE (ARMC ONLY)
Amphetamines, Ur Screen: NOT DETECTED
Barbiturates, Ur Screen: NOT DETECTED
Benzodiazepine, Ur Scrn: NOT DETECTED
Cannabinoid 50 Ng, Ur ~~LOC~~: NOT DETECTED
Cocaine Metabolite,Ur ~~LOC~~: NOT DETECTED
MDMA (Ecstasy)Ur Screen: NOT DETECTED
Methadone Scn, Ur: NOT DETECTED
Opiate, Ur Screen: NOT DETECTED
Phencyclidine (PCP) Ur S: NOT DETECTED
Tricyclic, Ur Screen: NOT DETECTED

## 2021-09-19 LAB — COMPREHENSIVE METABOLIC PANEL
ALT: 10 U/L (ref 0–44)
AST: 10 U/L — ABNORMAL LOW (ref 15–41)
Albumin: 3.1 g/dL — ABNORMAL LOW (ref 3.5–5.0)
Alkaline Phosphatase: 100 U/L (ref 38–126)
Anion gap: 3 — ABNORMAL LOW (ref 5–15)
BUN: 25 mg/dL — ABNORMAL HIGH (ref 8–23)
CO2: 39 mmol/L — ABNORMAL HIGH (ref 22–32)
Calcium: 8.3 mg/dL — ABNORMAL LOW (ref 8.9–10.3)
Chloride: 99 mmol/L (ref 98–111)
Creatinine, Ser: 1.35 mg/dL — ABNORMAL HIGH (ref 0.61–1.24)
GFR, Estimated: 59 mL/min — ABNORMAL LOW (ref >60)
Glucose, Bld: 103 mg/dL — ABNORMAL HIGH (ref 70–99)
Potassium: 4.2 mmol/L (ref 3.5–5.1)
Sodium: 141 mmol/L (ref 135–145)
Total Bilirubin: 0.4 mg/dL (ref 0.3–1.2)
Total Protein: 8.1 g/dL (ref 6.5–8.1)

## 2021-09-19 LAB — BLOOD GAS, ARTERIAL
Acid-Base Excess: 13.9 mmol/L — ABNORMAL HIGH (ref 0.0–2.0)
Bicarbonate: 41.2 mmol/L — ABNORMAL HIGH (ref 20.0–28.0)
FIO2: 0.6 %
MECHVT: 670 mL
Mechanical Rate: 16
O2 Saturation: 97.3 %
PEEP: 8 cmH2O
Patient temperature: 37
RATE: 16 resp/min
pCO2 arterial: 62 mmHg — ABNORMAL HIGH (ref 32–48)
pH, Arterial: 7.43 (ref 7.35–7.45)
pO2, Arterial: 76 mmHg — ABNORMAL LOW (ref 83–108)

## 2021-09-19 LAB — CBG MONITORING, ED: Glucose-Capillary: 105 mg/dL — ABNORMAL HIGH (ref 70–99)

## 2021-09-19 LAB — SARS CORONAVIRUS 2 BY RT PCR: SARS Coronavirus 2 by RT PCR: NEGATIVE

## 2021-09-19 LAB — BRAIN NATRIURETIC PEPTIDE: B Natriuretic Peptide: 268.4 pg/mL — ABNORMAL HIGH (ref 0.0–100.0)

## 2021-09-19 LAB — STREP PNEUMONIAE URINARY ANTIGEN: Strep Pneumo Urinary Antigen: NEGATIVE

## 2021-09-19 LAB — PATHOLOGIST SMEAR REVIEW

## 2021-09-19 LAB — TROPONIN I (HIGH SENSITIVITY): Troponin I (High Sensitivity): 9 ng/L (ref <18)

## 2021-09-19 LAB — MRSA NEXT GEN BY PCR, NASAL: MRSA by PCR Next Gen: NOT DETECTED

## 2021-09-19 MED ORDER — IPRATROPIUM-ALBUTEROL 0.5-2.5 (3) MG/3ML IN SOLN: 3.0000 mL | RESPIRATORY_TRACT | Status: DC

## 2021-09-19 MED ORDER — ENOXAPARIN SODIUM 100 MG/ML IJ SOSY
0.5000 mg/kg | PREFILLED_SYRINGE | INTRAMUSCULAR | Status: DC
  Administered 2021-09-19 – 2021-09-25 (×7): 85 mg via SUBCUTANEOUS
  Filled 2021-09-19 (×7): qty 0.85

## 2021-09-19 MED ORDER — PROPOFOL 1000 MG/100ML IV EMUL
0.0000 ug/kg/min | INTRAVENOUS | Status: DC
  Administered 2021-09-19 – 2021-09-22 (×25): 40 ug/kg/min via INTRAVENOUS
  Administered 2021-09-22: 20 ug/kg/min via INTRAVENOUS
  Administered 2021-09-22 (×2): 40 ug/kg/min via INTRAVENOUS
  Administered 2021-09-22: 20 ug/kg/min via INTRAVENOUS
  Administered 2021-09-22 – 2021-09-23 (×7): 40 ug/kg/min via INTRAVENOUS
  Filled 2021-09-19 (×2): qty 100
  Filled 2021-09-19: qty 200
  Filled 2021-09-19 (×6): qty 100
  Filled 2021-09-19: qty 200
  Filled 2021-09-19 (×10): qty 100
  Filled 2021-09-19: qty 200
  Filled 2021-09-19 (×4): qty 100
  Filled 2021-09-19: qty 200
  Filled 2021-09-19 (×6): qty 100

## 2021-09-19 MED ORDER — IPRATROPIUM-ALBUTEROL 0.5-2.5 (3) MG/3ML IN SOLN
3.0000 mL | Freq: Four times a day (QID) | RESPIRATORY_TRACT | Status: DC
  Administered 2021-09-19 – 2021-10-01 (×48): 3 mL via RESPIRATORY_TRACT
  Filled 2021-09-19 (×48): qty 3

## 2021-09-19 MED ORDER — MIDAZOLAM HCL 2 MG/2ML IJ SOLN
2.0000 mg | INTRAMUSCULAR | Status: DC | PRN
Start: 2021-09-19 — End: 2021-10-12
  Administered 2021-09-20 – 2021-10-12 (×21): 2 mg via INTRAVENOUS
  Filled 2021-09-19 (×21): qty 2

## 2021-09-19 MED ORDER — IPRATROPIUM-ALBUTEROL 0.5-2.5 (3) MG/3ML IN SOLN: 3.0000 mL | Freq: Once | RESPIRATORY_TRACT | Status: DC

## 2021-09-19 MED ORDER — PROPOFOL 1000 MG/100ML IV EMUL
INTRAVENOUS | Status: AC
  Administered 2021-09-19: 5 ug/kg/min via INTRAVENOUS
  Filled 2021-09-19: qty 100

## 2021-09-19 MED ORDER — CHLORHEXIDINE GLUCONATE CLOTH 2 % EX PADS
6.0000 | MEDICATED_PAD | Freq: Every day | CUTANEOUS | Status: DC
  Administered 2021-09-19 – 2021-11-07 (×45): 6 via TOPICAL

## 2021-09-19 MED ORDER — ACETAMINOPHEN 325 MG PO TABS
650.0000 mg | ORAL_TABLET | Freq: Four times a day (QID) | ORAL | Status: DC | PRN
  Administered 2021-09-21 – 2021-09-27 (×2): 650 mg via ORAL
  Filled 2021-09-19 (×2): qty 2

## 2021-09-19 MED ORDER — FUROSEMIDE 10 MG/ML IJ SOLN
40.0000 mg | Freq: Once | INTRAMUSCULAR | Status: AC
  Administered 2021-09-19: 40 mg via INTRAVENOUS
  Filled 2021-09-19: qty 4

## 2021-09-19 MED ORDER — ALBUTEROL SULFATE (2.5 MG/3ML) 0.083% IN NEBU
2.5000 mg | INHALATION_SOLUTION | RESPIRATORY_TRACT | Status: DC | PRN
  Administered 2021-10-03 – 2021-10-06 (×3): 2.5 mg via RESPIRATORY_TRACT
  Filled 2021-09-19 (×3): qty 3

## 2021-09-19 MED ORDER — PANTOPRAZOLE SODIUM 40 MG IV SOLR
40.0000 mg | Freq: Every day | INTRAVENOUS | Status: DC
  Administered 2021-09-19 – 2021-09-20 (×2): 40 mg via INTRAVENOUS
  Filled 2021-09-19 (×2): qty 10

## 2021-09-19 MED ORDER — ORAL CARE MOUTH RINSE
15.0000 mL | OROMUCOSAL | Status: DC
Start: 2021-09-19 — End: 2021-09-27
  Administered 2021-09-19 – 2021-09-27 (×78): 15 mL via OROMUCOSAL

## 2021-09-19 MED ORDER — HYDRALAZINE HCL 20 MG/ML IJ SOLN: 5.0000 mg | INTRAMUSCULAR | Status: DC | PRN

## 2021-09-19 MED ORDER — ETOMIDATE 2 MG/ML IV SOLN
INTRAVENOUS | Status: AC
  Filled 2021-09-19: qty 20

## 2021-09-19 MED ORDER — METHYLPREDNISOLONE SODIUM SUCC 125 MG IJ SOLR
80.0000 mg | INTRAMUSCULAR | Status: DC
  Administered 2021-09-20 – 2021-09-22 (×3): 80 mg via INTRAVENOUS
  Filled 2021-09-19 (×3): qty 2

## 2021-09-19 MED ORDER — FENTANYL 2500MCG IN NS 250ML (10MCG/ML) PREMIX INFUSION
0.0000 ug/h | INTRAVENOUS | Status: DC
  Administered 2021-09-19: 50 ug/h via INTRAVENOUS
  Administered 2021-09-19: 150 ug/h via INTRAVENOUS
  Administered 2021-09-20: 175 ug/h via INTRAVENOUS
  Administered 2021-09-20: 125 ug/h via INTRAVENOUS
  Administered 2021-09-21: 175 ug/h via INTRAVENOUS
  Administered 2021-09-22: 125 ug/h via INTRAVENOUS
  Administered 2021-09-22 – 2021-09-23 (×2): 175 ug/h via INTRAVENOUS
  Administered 2021-09-23 – 2021-09-24 (×2): 200 ug/h via INTRAVENOUS
  Administered 2021-09-24: 175 ug/h via INTRAVENOUS
  Administered 2021-09-25: 200 ug/h via INTRAVENOUS
  Administered 2021-09-25 (×2): 125 ug/h via INTRAVENOUS
  Administered 2021-09-29: 50 ug/h via INTRAVENOUS
  Administered 2021-09-30: 100 ug/h via INTRAVENOUS
  Administered 2021-10-01: 150 ug/h via INTRAVENOUS
  Filled 2021-09-19 (×15): qty 250

## 2021-09-19 MED ORDER — ROCURONIUM BROMIDE 10 MG/ML (PF) SYRINGE
PREFILLED_SYRINGE | INTRAVENOUS | Status: AC
  Filled 2021-09-19: qty 10

## 2021-09-19 MED ORDER — IPRATROPIUM-ALBUTEROL 0.5-2.5 (3) MG/3ML IN SOLN
9.0000 mL | Freq: Once | RESPIRATORY_TRACT | Status: AC
  Administered 2021-09-19: 9 mL via RESPIRATORY_TRACT
  Filled 2021-09-19: qty 9

## 2021-09-19 MED ORDER — FENTANYL BOLUS VIA INFUSION
50.0000 ug | INTRAVENOUS | Status: DC | PRN
  Administered 2021-09-19: 50 ug via INTRAVENOUS
  Administered 2021-09-20 – 2021-09-24 (×17): 100 ug via INTRAVENOUS
  Administered 2021-09-26: 50 ug via INTRAVENOUS
  Administered 2021-09-29 (×2): 100 ug via INTRAVENOUS
  Administered 2021-09-29: 50 ug via INTRAVENOUS
  Administered 2021-09-30 – 2021-10-10 (×35): 100 ug via INTRAVENOUS
  Administered 2021-10-10: 50 ug via INTRAVENOUS
  Administered 2021-10-10 (×4): 100 ug via INTRAVENOUS
  Administered 2021-10-10: 50 ug via INTRAVENOUS
  Administered 2021-10-10 – 2021-10-11 (×2): 100 ug via INTRAVENOUS

## 2021-09-19 MED ORDER — CHLORHEXIDINE GLUCONATE 0.12% ORAL RINSE (MEDLINE KIT)
15.0000 mL | Freq: Two times a day (BID) | OROMUCOSAL | Status: DC
  Administered 2021-09-19 – 2021-09-27 (×16): 15 mL via OROMUCOSAL

## 2021-09-19 MED ORDER — METHYLPREDNISOLONE SODIUM SUCC 125 MG IJ SOLR
125.0000 mg | Freq: Once | INTRAMUSCULAR | Status: AC
  Administered 2021-09-19: 125 mg via INTRAVENOUS
  Filled 2021-09-19: qty 2

## 2021-09-19 MED ORDER — ETOMIDATE 2 MG/ML IV SOLN
INTRAVENOUS | Status: AC
  Filled 2021-09-19: qty 10

## 2021-09-19 MED ORDER — BUDESONIDE 0.5 MG/2ML IN SUSP
0.5000 mg | Freq: Two times a day (BID) | RESPIRATORY_TRACT | Status: DC
  Administered 2021-09-19 – 2021-11-18 (×120): 0.5 mg via RESPIRATORY_TRACT
  Filled 2021-09-19 (×120): qty 2

## 2021-09-19 MED ORDER — DM-GUAIFENESIN ER 30-600 MG PO TB12: 1.0000 | ORAL_TABLET | Freq: Two times a day (BID) | ORAL | Status: DC | PRN

## 2021-09-19 MED ORDER — MIDAZOLAM HCL 2 MG/2ML IJ SOLN
2.0000 mg | INTRAMUSCULAR | Status: AC | PRN
  Administered 2021-09-19 – 2021-09-23 (×3): 2 mg via INTRAVENOUS
  Filled 2021-09-19 (×4): qty 2

## 2021-09-19 MED ORDER — GLYCOPYRROLATE 0.2 MG/ML IJ SOLN
0.1000 mg | Freq: Once | INTRAMUSCULAR | Status: AC
Start: 2021-09-19 — End: 2021-09-19
  Administered 2021-09-19: 0.1 mg via INTRAVENOUS
  Filled 2021-09-19: qty 1
  Filled 2021-09-19: qty 0.5

## 2021-09-19 MED ORDER — ACETAMINOPHEN 650 MG RE SUPP: 650.0000 mg | Freq: Four times a day (QID) | RECTAL | Status: DC | PRN

## 2021-09-19 MED ORDER — SUCCINYLCHOLINE CHLORIDE 200 MG/10ML IV SOSY
PREFILLED_SYRINGE | INTRAVENOUS | Status: AC
  Filled 2021-09-19: qty 10

## 2021-09-19 MED ORDER — SUCCINYLCHOLINE CHLORIDE 20 MG/ML IJ SOLN
INTRAMUSCULAR | Status: AC | PRN
  Administered 2021-09-19: 170 mg via INTRAVENOUS

## 2021-09-19 MED ORDER — FUROSEMIDE 10 MG/ML IJ SOLN
80.0000 mg | Freq: Once | INTRAMUSCULAR | Status: AC
Start: 2021-09-19 — End: 2021-09-19
  Administered 2021-09-19: 80 mg via INTRAVENOUS
  Filled 2021-09-19: qty 8

## 2021-09-19 MED ORDER — ETOMIDATE 2 MG/ML IV SOLN
INTRAVENOUS | Status: AC | PRN
  Administered 2021-09-19: 50 mg via INTRAVENOUS

## 2021-09-19 MED ORDER — DOCUSATE SODIUM 50 MG/5ML PO LIQD
100.0000 mg | Freq: Two times a day (BID) | ORAL | Status: DC
  Administered 2021-09-19 – 2021-09-20 (×3): 100 mg
  Filled 2021-09-19 (×4): qty 10

## 2021-09-19 MED ORDER — POLYETHYLENE GLYCOL 3350 17 G PO PACK
17.0000 g | PACK | Freq: Every day | ORAL | Status: DC
  Administered 2021-09-20 – 2021-10-02 (×4): 17 g
  Filled 2021-09-19 (×7): qty 1

## 2021-09-19 NOTE — Progress Notes (Signed)
Pt transported to CT on transport vent.

## 2021-09-19 NOTE — ED Notes (Signed)
EDP at bedside with intensivist. Pt to be intubated. Pt not responsive to verbal or physical stimuli.

## 2021-09-19 NOTE — ED Triage Notes (Signed)
Pt here from home via [facility] with resp distress. 72% on 5L. 86% on NRB. Pt on 2L at baseline.  60-end tidal 127/93 90 151-cbg

## 2021-09-19 NOTE — Progress Notes (Signed)
This is a no charge note  77M, hx of dCHF COPD on home O2 2 L, HTN, OSA on CPAP, PVD, hyperlipidemia, former smoker, morbid obesity with BMI 47, GI bleeding, CKD-3, presents with acute on chronic respiratory failure with hypoxia and hypercapnia, due to combination of CHF and COPD exacerbation. on BiPAP. VBG showed pH 7.25, CO2 08, O2 84. Pt is altered. Consulted PCCM. Pt is intubated and care is transferred to ICU, Dr. Milton Ferguson.   Rebecca Eaton, MD  [facility]   If 7PM-7AM, please contact night-coverage [email or web address] 09/19/2021, 2:25 PM

## 2021-09-19 NOTE — ED Provider Notes (Addendum)
[facility] Provider Note    Event Date/Time   First MD Initiated Contact with Patient 09/19/21 [phone]     (approximate)   History   Chief Complaint Respiratory Distress   HPI  Gary [name] is a 66 y.o. male with past medical history of hypertension, hyperlipidemia, diastolic CHF, COPD on 2 L nasal cannula, OSA, OHS, and CKD who presents to the ED complaining of shortness of breath.  History is limited due to patient's respiratory distress and majority of history is obtained from EMS.  They state that patient has been "feeling bad" for the past 5 days with increasing difficulty breathing.  Patient endorses a dry cough with shortness of breath, states his CPAP machine has not been working at home.  He denies any pain in his chest and has not had any fevers, does feel like his legs have been more swollen than usual but denies any leg pain.  EMS found O2 sats in the mid 70s on patient's usual 2 L nasal cannula, he was subsequently placed on CPAP with improvement into the low 90s.     Physical Exam   Triage Vital Signs: ED Triage Vitals [09/19/21 0955]  Enc Vitals Group     BP      Pulse      Resp      Temp      Temp src      SpO2      Weight (!) 376 lb 15.8 oz (171 kg)     Height '6\' 3"'$  (1.905 m)     Head Circumference      Peak Flow      Pain Score 0     Pain Loc      Pain Edu?      Excl. in [place]?     Most recent vital signs: Vitals:   09/19/21 1219 09/19/21 1220  BP:  (!) 147/94  Pulse: 74 74  Resp: 16 16  Temp:    SpO2: 94% 94%    Constitutional: Alert and oriented, repetitive questioning. Eyes: Conjunctivae are normal.  Pupils equal, round, and reactive to light bilaterally. Head: Atraumatic. Nose: No congestion/rhinnorhea. Mouth/Throat: Mucous membranes are moist.  Cardiovascular: Normal rate, regular rhythm. Grossly normal heart sounds.  2+ radial pulses bilaterally. Respiratory: Increased respiratory effort with accessory muscle use.   Lung sounds faint throughout with expiratory wheezing. Gastrointestinal: Soft and nontender. No distention. Musculoskeletal: No lower extremity tenderness, 2+ pitting edema to knees bilaterally. Neurologic:  Normal speech and language. No gross focal neurologic deficits are appreciated.    ED Results / Procedures / Treatments   Labs (all labs ordered are listed, but only abnormal results are displayed) Labs Reviewed  CBC WITH DIFFERENTIAL/PLATELET - Abnormal; Notable for the following components:      Result Value   Hemoglobin 12.5 (*)    MCH 22.6 (*)    MCHC 25.6 (*)    RDW 17.9 (*)    nRBC 4.1 (*)    All other components within normal limits  COMPREHENSIVE METABOLIC PANEL - Abnormal; Notable for the following components:   CO2 39 (*)    Glucose, Bld 103 (*)    BUN 25 (*)    Creatinine, Ser 1.35 (*)    Calcium 8.3 (*)    Albumin 3.1 (*)    AST 10 (*)    GFR, Estimated 59 (*)    Anion gap 3 (*)    All other components within normal limits  BRAIN  NATRIURETIC PEPTIDE - Abnormal; Notable for the following components:   B Natriuretic Peptide 268.4 (*)    All other components within normal limits  BLOOD GAS, VENOUS - Abnormal; Notable for the following components:   pCO2, Ven 108 (*)    pO2, Ven 55 (*)    Bicarbonate 47.4 (*)    Acid-Base Excess 15.0 (*)    All other components within normal limits  BLOOD GAS, VENOUS - Abnormal; Notable for the following components:   pH, Ven 7.18 (*)    pCO2, Ven 122 (*)    pO2, Ven 68 (*)    Bicarbonate 45.5 (*)    Acid-Base Excess 11.9 (*)    All other components within normal limits  SARS CORONAVIRUS 2 BY RT PCR  EXPECTORATED SPUTUM ASSESSMENT W GRAM STAIN, RFLX TO RESP C  PATHOLOGIST SMEAR REVIEW  URINE DRUG SCREEN, QUALITATIVE ([facility] ONLY)  BLOOD GAS, ARTERIAL  TROPONIN I (HIGH SENSITIVITY)     EKG  ED ECG REPORT I, Blake Divine, the attending physician, personally viewed and interpreted this ECG.   Date: 09/19/2021   EKG Time: 10:00  Rate: 73  Rhythm: normal sinus rhythm, frequent PVC's noted  Axis: Normal  Intervals:none  ST&T Change: None  RADIOLOGY Chest x-ray reviewed and interpreted by me with bilateral pulmonary edema, no focal infiltrate or effusion noted.  PROCEDURES:  Critical Care performed: Yes, see critical care procedure note(s)  .Critical Care Performed by: Blake Divine, MD Authorized by: Blake Divine, MD   Critical care provider statement:    Critical care time (minutes):  30   Critical care time was exclusive of:  Separately billable procedures and treating other patients and teaching time   Critical care was necessary to treat or prevent imminent or life-threatening deterioration of the following conditions:  Respiratory failure   Critical care was time spent personally by me on the following activities:  Development of treatment plan with patient or surrogate, discussions with consultants, evaluation of patient's response to treatment, examination of patient, ordering and review of laboratory studies, ordering and review of radiographic studies, ordering and performing treatments and interventions, pulse oximetry, re-evaluation of patient's condition and review of old charts   I assumed direction of critical care for this patient from another provider in my specialty: no     Care discussed with: admitting provider   Procedure Name: Intubation Date/Time: 09/19/2021 12:30 PM Performed by: Blake Divine, MD Pre-anesthesia Checklist: Patient identified, Patient being monitored, Emergency Drugs available, Timeout performed and Suction available Oxygen Delivery Method: Non-rebreather mask Preoxygenation: Pre-oxygenation with 100% oxygen Induction Type: Rapid sequence Ventilation: Mask ventilation without difficulty Laryngoscope Size: Glidescope and 4 Grade View: Grade I Tube size: 8.0 mm Number of attempts: 1 Airway Equipment and Method: Video-laryngoscopy Placement  Confirmation: ETT inserted through vocal cords under direct vision, CO2 detector and Breath sounds checked- equal and bilateral Secured at: 26 cm Tube secured with: ETT holder Dental Injury: Teeth and Oropharynx as per pre-operative assessment       MEDICATIONS ORDERED IN ED: Medications  ipratropium-albuterol (DUONEB) 0.5-2.5 (3) MG/3ML nebulizer solution 3 mL (has no administration in time range)  dextromethorphan-guaiFENesin (MUCINEX DM) 30-600 MG per 12 hr tablet 1 tablet (has no administration in time range)  albuterol (PROVENTIL) (2.5 MG/3ML) 0.083% nebulizer solution 2.5 mg (has no administration in time range)  hydrALAZINE (APRESOLINE) injection 5 mg (has no administration in time range)  acetaminophen (TYLENOL) tablet 650 mg (has no administration in time range)  acetaminophen (TYLENOL) suppository 650  mg (has no administration in time range)  enoxaparin (LOVENOX) injection 85 mg (has no administration in time range)  etomidate (AMIDATE) 2 MG/ML injection (has no administration in time range)  succinylcholine (ANECTINE) 200 MG/10ML syringe (has no administration in time range)  etomidate (AMIDATE) 2 MG/ML injection (has no administration in time range)  docusate (COLACE) 50 MG/5ML liquid 100 mg (has no administration in time range)  polyethylene glycol (MIRALAX / GLYCOLAX) packet 17 g (has no administration in time range)  fentaNYL 2520mg in NS 2582m(1051mml) infusion-PREMIX (has no administration in time range)  fentaNYL (SUBLIMAZE) bolus via infusion 50-100 mcg (has no administration in time range)  propofol (DIPRIVAN) 1000 MG/100ML infusion (has no administration in time range)  pantoprazole (PROTONIX) injection 40 mg (has no administration in time range)  ipratropium-albuterol (DUONEB) 0.5-2.5 (3) MG/3ML nebulizer solution 9 mL (9 mLs Nebulization Given 09/19/21 1017)  methylPREDNISolone sodium succinate (SOLU-MEDROL) 125 mg/2 mL injection 125 mg (125 mg Intravenous Given  09/19/21 1039)  furosemide (LASIX) injection 80 mg (80 mg Intravenous Given 09/19/21 1111)  etomidate (AMIDATE) injection (50 mg Intravenous Given 09/19/21 1222)  succinylcholine (ANECTINE) injection (170 mg Intravenous Given 09/19/21 1223)     IMPRESSION / MDM / ASSESSMENT AND PLAN / ED COURSE  I reviewed the triage vital signs and the nursing notes.                              66 17o. male with past medical history of hypertension, hyperlipidemia, COPD on 2 L, diastolic CHF, OSA, OHS, and CKD who presents to the ED with malaise, cough, and increasing difficulty breathing with leg swelling for the past 5 days.  Patient's presentation is most consistent with acute presentation with potential threat to life or bodily function.  Differential diagnosis includes, but is not limited to, COPD exacerbation, CHF exacerbation, pneumonia, PE, ACS, obesity hypoventilation, hypercapnia, arrhythmia.  Patient ill-appearing and in some respiratory distress with increased respiratory effort, currently maintaining O2 sats on BiPAP.  EKG shows sinus rhythm with frequent PVCs, no ischemic changes noted.  Reviewing patient's chart, he has had frequent admissions for hypoxic and hypercapnic respiratory failure secondary to COPD, CHF, and OHS.  He has required intubation multiple times in the past, currently awake and alert but appears slightly encephalopathic and we will check a VBG.  Plan to further assess with chest x-ray and labs, treat with IV Solu-Medrol and DuoNebs.  Chest x-ray consistent with pulmonary edema and CHF exacerbation, we will also treat patient with IV Lasix.  VBG shows hypercapnic respiratory failure, with PCO2 of 108 explaining his mild encephalopathy, although not as severe as previous with pH of 7.25.  Additional labs are reassuring with no significant anemia, electrolyte abnormality, AKI, or troponin elevation.  Low suspicion for ACS or PE at this time, symptoms seem related to CHF and COPD, with  likely component of obesity hypoventilation.  Case discussed with hospitalist for admission.  After discussion with the hospitalist, I was notified that patient had clinically worsened and become less responsive.  Repeat blood gas because shows worsening hypercapnia and acidosis and on reassessment, patient briefly opens eyes with sternal rub but then goes back to sleep.  He was intubated for worsening hypercapnic respiratory failure and case discussed with ICU for admission.      FINAL CLINICAL IMPRESSION(S) / ED DIAGNOSES   Final diagnoses:  Acute respiratory failure with hypoxia and hypercapnia ([name])  Acute on chronic  diastolic congestive heart failure ([name])  COPD exacerbation ([name])  Obesity hypoventilation syndrome ([place])     Rx / DC Orders   ED Discharge Orders     None        Note:  This document was prepared using Dragon voice recognition software and may include unintentional dictation errors.   Blake Divine, MD 09/19/21 1115    Blake Divine, MD 09/19/21 [phone]
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
## 2021-09-19 NOTE — Progress Notes (Signed)
Patient transported to the ICU from the ED without complication. 

## 2021-09-19 NOTE — IPAL (Addendum)
  Interdisciplinary Goals of Care Family Meeting   Date carried out: 09/19/2021  Location of the meeting: Bedside  Member's involved: Nurse Practitioner, Bedside Registered Nurse, and Family Member or next of kin  Durable Power of Attorney or acting medical decision maker: Gary [name] (sister) who is POA.    Discussion: We discussed goals of care for Gary [name] .  Discussed with Gary [name] that patient has had multiple recent admissions for the same issues including acute on chronic hypoxic and hypercapnic respiratory failure due to CHF/COPD exacerbations along with morbid obesity, OHS/OSA with CPAP noncompliance.  Last admission (May 2023) tracheostomy was recommended, however at that time family wanted to defer decision to the patient, of which the patient declined.  Gary [name] discusses that the patient has not made an effort to lose weight. Pt is currently intubated and sedated. Gary [name] expresses desire (and reports her sister Gary [name] who is co-POA is in agreement) to proceed with tracheostomy placement in an effort to cure OHS/OHS and try to prevent future hospitalizations until the patient is able to lose significant amount of weight.   Code status: Full Code  Disposition: Continue current acute care  Time spent for the meeting: 15 minutes   Gary [name], AGACNP-BC [place] epic messenger for cross cover needs If after hours, please call E-link   Gary Bienenstock, NP  09/19/2021, 4:37 PM

## 2021-09-19 NOTE — Progress Notes (Signed)
Patient responsive to pain, NSR with ectopy, on ventilator. Family updated in room, patient password Gary [name]. Patient resting well. No significant events. Will continue to monitor.

## 2021-09-19 NOTE — Progress Notes (Signed)
RT attempted ABG x2 on patient without success. RT notified ICU RT.

## 2021-09-19 NOTE — Sedation Documentation (Signed)
Intubated by EDP [place] with 8-0 tube.

## 2021-09-20 ENCOUNTER — Inpatient Hospital Stay: Payer: Medicaid Other

## 2021-09-20 DIAGNOSIS — J9621 Acute and chronic respiratory failure with hypoxia: Secondary | ICD-10-CM | POA: Diagnosis not present

## 2021-09-20 DIAGNOSIS — J9622 Acute and chronic respiratory failure with hypercapnia: Secondary | ICD-10-CM | POA: Diagnosis not present

## 2021-09-20 LAB — BASIC METABOLIC PANEL
Anion gap: 8 (ref 5–15)
BUN: 29 mg/dL — ABNORMAL HIGH (ref 8–23)
CO2: 32 mmol/L (ref 22–32)
Calcium: 8.2 mg/dL — ABNORMAL LOW (ref 8.9–10.3)
Chloride: 99 mmol/L (ref 98–111)
Creatinine, Ser: 1.6 mg/dL — ABNORMAL HIGH (ref 0.61–1.24)
GFR, Estimated: 48 mL/min — ABNORMAL LOW (ref >60)
Glucose, Bld: 87 mg/dL (ref 70–99)
Potassium: 3.4 mmol/L — ABNORMAL LOW (ref 3.5–5.1)
Sodium: 139 mmol/L (ref 135–145)

## 2021-09-20 LAB — CBC
HCT: 44.9 % (ref 39.0–52.0)
Hemoglobin: 12.6 g/dL — ABNORMAL LOW (ref 13.0–17.0)
MCH: 23.2 pg — ABNORMAL LOW (ref 26.0–34.0)
MCHC: 28.1 g/dL — ABNORMAL LOW (ref 30.0–36.0)
MCV: 82.5 fL (ref 80.0–100.0)
Platelets: 153 10*3/uL (ref 150–400)
RBC: 5.44 MIL/uL (ref 4.22–5.81)
RDW: 17.2 % — ABNORMAL HIGH (ref 11.5–15.5)
WBC: 10.2 10*3/uL (ref 4.0–10.5)
nRBC: 0.3 % — ABNORMAL HIGH (ref 0.0–0.2)

## 2021-09-20 LAB — GLUCOSE, CAPILLARY
Glucose-Capillary: 98 mg/dL (ref 70–99)
Glucose-Capillary: 111 mg/dL — ABNORMAL HIGH (ref 70–99)
Glucose-Capillary: 125 mg/dL — ABNORMAL HIGH (ref 70–99)
Glucose-Capillary: 135 mg/dL — ABNORMAL HIGH (ref 70–99)

## 2021-09-20 LAB — TRIGLYCERIDES: Triglycerides: 189 mg/dL — ABNORMAL HIGH (ref <150)

## 2021-09-20 MED ORDER — ADULT MULTIVITAMIN LIQUID CH
15.0000 mL | Freq: Every day | ORAL | Status: DC
  Administered 2021-09-21: 15 mL
  Filled 2021-09-20 (×2): qty 15

## 2021-09-20 MED ORDER — VITAL HIGH PROTEIN PO LIQD
1000.0000 mL | ORAL | Status: DC
  Administered 2021-09-20 – 2021-09-22 (×3): 1000 mL

## 2021-09-20 MED ORDER — PANTOPRAZOLE 2 MG/ML SUSPENSION
40.0000 mg | Freq: Every day | ORAL | Status: DC
  Administered 2021-09-21 – 2021-10-02 (×10): 40 mg
  Filled 2021-09-20 (×10): qty 20

## 2021-09-20 MED ORDER — POTASSIUM CHLORIDE 20 MEQ PO PACK
40.0000 meq | PACK | Freq: Once | ORAL | Status: AC
  Administered 2021-09-20: 40 meq
  Filled 2021-09-20: qty 2

## 2021-09-20 MED ORDER — FREE WATER
30.0000 mL | Status: DC
  Administered 2021-09-20 – 2021-09-25 (×28): 30 mL

## 2021-09-20 MED ORDER — PROSOURCE TF PO LIQD
90.0000 mL | Freq: Every day | ORAL | Status: DC
  Administered 2021-09-20 – 2021-09-21 (×7): 90 mL
  Filled 2021-09-20 (×11): qty 90

## 2021-09-20 NOTE — Progress Notes (Signed)
[facility] ADULT ICU REPLACEMENT PROTOCOL   The patient does apply for the [facility] Adult ICU Electrolyte Replacment Protocol based on the criteria listed below:   1.Exclusion criteria: TCTS patients, ECMO patients, and Dialysis patients 2. Is GFR >/= 30 ml/min? Yes.    Patient's GFR today is 48 3. Is SCr </= 2? Yes.   Patient's SCr is 1.6 mg/dL 4. Did SCr increase >/= 0.5 in 24 hours? No. 5.Pt's weight >40kg  Yes.   6. Abnormal electrolyte(s): k 3.4  7. Electrolytes replaced per protocol 8.  Call MD STAT for K+ </= 2.5, Phos </= 1, or Mag </= 1 Physician:    Ronda Fairly A 09/20/2021 5:13 AM
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
## 2021-09-20 NOTE — Progress Notes (Signed)
PHARMACIST - PHYSICIAN COMMUNICATION  CONCERNING: IV to Oral Route Change Policy  RECOMMENDATION: This patient is receiving pantoprazole by the intravenous route.  Based on criteria approved by the Pharmacy and Therapeutics Committee, the intravenous medication(s) is/are being converted to the equivalent oral dose form(s).   DESCRIPTION: These criteria include: The patient is eating (either orally or via tube) and/or has been taking other orally administered medications for a least 24 hours The patient has no evidence of active gastrointestinal bleeding or impaired GI absorption (gastrectomy, short bowel, patient on TNA or NPO).  If you have questions about this conversion, please contact the [place], [facility] 09/20/2021 11:36 AM

## 2021-09-20 NOTE — Progress Notes (Signed)
Initial Nutrition Assessment  DOCUMENTATION CODES:   Morbid obesity  INTERVENTION:   Vital HP '@40ml'$ /hr + ProSource TF 14m five times daily   Free water flushes 336mq4 hours to maintain tube patency   Propofol: 41 ml/hr- provides 1082kcal/day   Regimen provides 2442kcal/day, 194g/day protein and 98270may of free water.   Liquid MVI daily via tube   NUTRITION DIAGNOSIS:   Inadequate oral intake related to inability to eat (pt sedated and ventilated) as evidenced by NPO status.  GOAL:   Provide needs based on ASPEN/SCCM guidelines  MONITOR:   Vent status, Labs, Weight trends, TF tolerance, Skin, I & O's  REASON FOR ASSESSMENT:   Ventilator    ASSESSMENT:   [age]o male with h/o PUD, OSA, HTN, CKD III, substance abuse, CHF, Hep C and COPD and COVID 19 (02/2021) who is admitted with COPD exacerbation, CHF and AKI.  Pt sedated and ventilated. OGT in place. Plan is to start tube feeds today. Pt is well known to this RD from recent previous admissions. Pt with good appetite and oral intake at baseline. Pt is weight stable at baseline. Plan is for tracheostomy possibly this week.   Medications reviewed and include: colace, lovenox, solu-medrol, protonix, miralax, propofol   Labs reviewed: K 3.4(L), BUN 29(H), creat 1.60(H)  Patient is currently intubated on ventilator support MV: 10.9 L/min Temp (24hrs), Avg:99.2 F (37.3 C), Min:97.5 F (36.4 C), Max:100.4 F (38 C)  Propofol: 41 ml/hr- provides 1082kcal/day   MAP- >63m51m  UOP- 2415ml68m   NUTRITION - FOCUSED PHYSICAL EXAM:  Flowsheet Row Most Recent Value  Orbital Region No depletion  Upper Arm Region No depletion  Thoracic and Lumbar Region No depletion  Buccal Region No depletion  Temple Region No depletion  Clavicle Bone Region No depletion  Clavicle and Acromion Bone Region No depletion  Scapular Bone Region No depletion  Dorsal Hand No depletion  Patellar Region No depletion  Anterior Thigh  Region No depletion  Posterior Calf Region No depletion  Edema (RD Assessment) Moderate  Hair Reviewed  Eyes Reviewed  Mouth Reviewed  Skin Reviewed  Nails Reviewed   Diet Order:   Diet Order             Diet NPO time specified  Diet effective now                  EDUCATION NEEDS:   No education needs have been identified at this time  Skin:  Skin Assessment: Reviewed RN Assessment  Last BM:  PTA  Height:   Ht Readings from Last 1 Encounters:  09/20/21 '6\' 3"'$  (1.905 m)    Weight:   Wt Readings from Last 1 Encounters:  09/20/21 (!) 182.5 kg    Ideal Body Weight:  89 kg  BMI:  Body mass index is 50.29 kg/m.  Estimated Nutritional Needs:   Kcal:  2007-2555kcal/day  Protein:  178-225g/day  Fluid:  2.0L/day  CaseyKoleen DistanceRD, LDN Please refer to [facility] and/or RD on-call/weekend/after hours pager

## 2021-09-20 NOTE — TOC Initial Note (Signed)
Transition of Care [facility]) - Initial/Assessment Note    Patient Details  Name: Gary [name] MRN: [id number] Date of Birth: 1955-06-17  Transition of Care [facility]) CM/SW Contact:    Shelbie Hutching, RN Phone Number: 09/20/2021, 11:10 AM  Clinical Narrative:                 Patient admitted to the hospital for acute on chronic respiratory failure, patient has frequent admissions for the same.  Patient is from home, he is on chronic oxygen at 2 L from [employer].  He is a current patient in the HF clinic with Darylene Price, he also gets Paramedicine visits from [place] at home.  Patient had previously been driving himself to appointments, he does have a girlfriend and 5 sisters. Patient is currently in the ICU intubated and sedated.   Plan is for Trach on 6/15-  RNCM has reached out to [place] with [facility] and Raquel Sarna with [facility].  Raquel Sarna with [facility] reports that they do not accept Medicaid.  Jenn with [facility] says that she will review and that maybe the hospital in [place] can accept him.    TOC will cont to follow.   Expected Discharge Plan: Long Term Acute Care (LTAC) Barriers to Discharge: Continued Medical Work up   Patient Goals and CMS Choice Patient states their goals for this hospitalization and ongoing recovery are:: Family would like for patient to get [facility] Medicare.gov Compare Post Acute Care list provided to:: Patient Represenative (must comment) Choice offered to / list presented to : Sibling  Expected Discharge Plan and Services Expected Discharge Plan: Long Term Acute Care (LTAC)   Discharge Planning Services: CM Consult Post Acute Care Choice: Long Term Acute Care (LTAC) Living arrangements for the past 2 months: Single Family Home                 DME Arranged: N/A DME Agency: NA       HH Arranged: NA HH Agency: NA        Prior Living Arrangements/Services Living arrangements for the past 2 months: Single Family Home Lives with:: Significant Other Patient  language and need for interpreter reviewed:: Yes Do you feel safe going back to the place where you live?: Yes      Need for Family Participation in Patient Care: Yes (Comment) Care giver support system in place?: Yes (comment) (significant other and sisters) Current home services: DME (oxygen, walker, bipap) Criminal Activity/Legal Involvement Pertinent to Current Situation/Hospitalization: No - Comment as needed  Activities of Daily Living      Permission Sought/Granted Permission sought to share information with : Case Manager, Family Supports Permission granted to share information with : Yes, Verbal Permission Granted              Emotional Assessment Appearance:: Appears stated age Attitude/Demeanor/Rapport: Intubated (Following Commands or Not Following Commands) Affect (typically observed): Unable to Assess   Alcohol / Substance Use: Not Applicable Psych Involvement: No (comment)  Admission diagnosis:  COPD exacerbation ([place]) [J44.1] Obesity hypoventilation syndrome ([name]) [E66.2] Acute on chronic diastolic congestive heart failure ([name]) [I50.33] Acute respiratory failure with hypoxia and hypercapnia ([name]) [J96.01, J96.02] Acute on chronic respiratory failure with hypoxia and hypercapnia ([name]) [Y50.35, J96.22] Patient Active Problem List   Diagnosis Date Noted   Acute on chronic respiratory failure with hypercapnia ([place]) 08/07/2021   COPD (chronic obstructive pulmonary disease) ([name])    HLD (hyperlipidemia)    Iron deficiency anemia    Chronic kidney  disease, stage 3a ([place]) 07/22/2021   Acute cardiac pulmonary edema ([name]) 07/22/2021   Acute on chronic respiratory failure with hypoxia and hypercapnia ([name]) 07/20/2021   Chronic obstructive pulmonary disease ([name])    Positive D dimer    CHF exacerbation ([place]) 05/03/2021   Acute CHF (congestive heart failure) ([place]) 04/04/2021   Weakness    Pneumonia 03/11/2021   Acute respiratory failure with hypoxia and hypercapnia  ([place]) 03/11/2021   (HFpEF) heart failure with preserved ejection fraction ([name])    Chronic respiratory failure with hypoxia ([place])    Hyperlipidemia 02/19/2021   Acute respiratory distress syndrome (ARDS) due to COVID-19 virus ([place])    Acute on chronic respiratory failure ([place]) 02/18/2021   Spondylosis without myelopathy or radiculopathy, lumbosacral region 11/15/2020   Lumbosacral facet joint syndrome (Bilateral) 10/31/2020   Generalized osteoarthritis of multiple sites 10/31/2020   AKI (acute kidney injury) ([place]) 03/04/2020   Hyperkalemia 03/04/2020   COPD with acute exacerbation ([place]) 03/02/2020   Marijuana use 01/17/2020   Elevated BUN 01/17/2020   Hypocalcemia 01/17/2020   Hypochloremia 01/17/2020   Low thyroid stimulating hormone (TSH) level 01/17/2020   Vitamin D deficiency 01/17/2020   Elevated sed rate 01/17/2020   Thrombocytopenia ([place]) 01/17/2020   Elevated hematocrit 01/17/2020   Elevated brain natriuretic peptide (BNP) level 01/17/2020   Positive hepatitis C antibody test 01/17/2020   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 mass index (BMI) of 50.0 to 59.9 in adult ([place]) 01/17/2020   Osteoarthritis of glenohumeral joints (Bilateral) 01/17/2020   Osteoarthritis of AC (acromioclavicular) joints (Bilateral) 01/17/2020   Lumbar Grade 1 Anterolisthesis of L4 over L5 (3 mm) (stable) 01/17/2020   Osteoarthritis of lumbar spine (L3 through S1) 01/17/2020   Lumbar facet osteoarthritis (Multilevel) (Bilateral) 01/17/2020   Osteoarthritis of hips (Bilateral) 01/17/2020   Osteoarthritis of knees (Bilateral) 01/17/2020   Tricompartment osteoarthritis of knees (Bilateral) 01/17/2020   Patellofemoral arthralgia of knees (Bilateral) 01/17/2020   Plantar and Achilles calcaneal spurs (Bilateral) 01/17/2020   Ankle edema (Bilateral) 01/17/2020   Traumatic osteoarthritis of hands (Bilateral) 01/17/2020   Polysubstance abuse ([place]) 41/32/4401   Acute metabolic encephalopathy  02/72/5366   Chronic pain syndrome 07/14/2019   Pharmacologic therapy 07/14/2019   Disorder of skeletal system 07/14/2019   Problems influencing health status 07/14/2019   Chronic low back pain (1ry area of Pain) (Bilateral) (L>R) w/o sciatica 07/14/2019   Chronic shoulder pain (2ry area of Pain) (Bilateral) (L>R) 07/14/2019   Chronic feet pain (3ry area of Pain) (Bilateral) 07/14/2019   Chronic knee pain (4th area of Pain) (Bilateral) (L>R) 07/14/2019   Chronic hip pain (5th area of Pain) (Bilateral) (L>R) 07/14/2019   Chronic hand pain (6th area of Pain) (Bilateral) (L>R) 07/14/2019   Lumbar facet arthropathy 07/14/2019   DDD (degenerative disc disease), lumbosacral 07/14/2019   Closed fracture of shaft of humerus, sequela (Left) 07/14/2019   DDD (degenerative disc disease), cervical 07/14/2019   Tobacco use 05/04/2017   Lymphedema 05/04/2017   CHF (congestive heart failure) ([place]) 04/12/2017   Acute on chronic diastolic CHF (congestive heart failure) ([place]) 02/28/2015   Obstructive sleep apnea 02/28/2015   Acute on chronic diastolic congestive heart failure ([name])    Acute on chronic respiratory failure with hypoxia ([name])    Edema of both feet    Closed fracture of scapula, sequela (Left) 04/01/2014   Obesity, Class III, BMI 40-49.9 (morbid obesity) ([place]) 04/01/2014   HTN (hypertension) 04/01/2014   Multiple rib fractures 03/27/2014   PCP:  [facility]:   [place], [place] - [place] [address] [place] [address] Phone: [phone] Fax: [phone]     Social Determinants of Health (SDOH) Interventions    Readmission Risk Interventions    05/04/2021    1:26 PM 04/07/2021    1:39 PM 03/12/2021   11:54 AM  Readmission Risk Prevention Plan  Transportation Screening Complete Complete Complete  PCP or Specialist Appt within 3-5 Days  Complete Complete  HRI or [place]   Complete  Social Work  Consult for [place] Planning/Counseling   Complete  Palliative Care Screening  Not Applicable Not Applicable  Medication Review Press photographer) Complete Complete Complete  PCP or Specialist appointment within 3-5 days of discharge Complete    HRI or [place] Complete    SW Recovery Care/Counseling Consult Complete    Palliative Care Screening Not [place] Not Applicable

## 2021-09-21 ENCOUNTER — Inpatient Hospital Stay: Payer: Medicaid Other

## 2021-09-21 ENCOUNTER — Ambulatory Visit: Payer: Medicaid Other | Admitting: Family

## 2021-09-21 DIAGNOSIS — J9622 Acute and chronic respiratory failure with hypercapnia: Secondary | ICD-10-CM | POA: Diagnosis not present

## 2021-09-21 DIAGNOSIS — J9621 Acute and chronic respiratory failure with hypoxia: Secondary | ICD-10-CM | POA: Diagnosis not present

## 2021-09-21 LAB — PROCALCITONIN: Procalcitonin: 0.68 ng/mL

## 2021-09-21 LAB — RESPIRATORY PANEL BY PCR

## 2021-09-21 LAB — BLOOD GAS, ARTERIAL
Acid-base deficit: 0.9 mmol/L (ref 0.0–2.0)
Bicarbonate: 25.8 mmol/L (ref 20.0–28.0)
FIO2: 50 %
O2 Content: 50 L/min
O2 Saturation: 95 %
PEEP: 5 cmH2O
Patient temperature: 37
RATE: 16 resp/min
Spontaneous VT: 670 mL
pCO2 arterial: 50 mmHg — ABNORMAL HIGH (ref 32–48)
pH, Arterial: 7.32 — ABNORMAL LOW (ref 7.35–7.45)
pO2, Arterial: 75 mmHg — ABNORMAL LOW (ref 83–108)

## 2021-09-21 LAB — GLUCOSE, CAPILLARY
Glucose-Capillary: 112 mg/dL — ABNORMAL HIGH (ref 70–99)
Glucose-Capillary: 112 mg/dL — ABNORMAL HIGH (ref 70–99)
Glucose-Capillary: 113 mg/dL — ABNORMAL HIGH (ref 70–99)
Glucose-Capillary: 117 mg/dL — ABNORMAL HIGH (ref 70–99)
Glucose-Capillary: 138 mg/dL — ABNORMAL HIGH (ref 70–99)
Glucose-Capillary: 142 mg/dL — ABNORMAL HIGH (ref 70–99)

## 2021-09-21 LAB — CBC
HCT: 49 % (ref 39.0–52.0)
Hemoglobin: 13.9 g/dL (ref 13.0–17.0)
MCH: 22.7 pg — ABNORMAL LOW (ref 26.0–34.0)
MCHC: 28.4 g/dL — ABNORMAL LOW (ref 30.0–36.0)
MCV: 80.2 fL (ref 80.0–100.0)
Platelets: 178 10*3/uL (ref 150–400)
RBC: 6.11 MIL/uL — ABNORMAL HIGH (ref 4.22–5.81)
RDW: 18.3 % — ABNORMAL HIGH (ref 11.5–15.5)
WBC: 5 10*3/uL (ref 4.0–10.5)
nRBC: 2.4 % — ABNORMAL HIGH (ref 0.0–0.2)

## 2021-09-21 LAB — BASIC METABOLIC PANEL
Anion gap: 10 (ref 5–15)
BUN: 51 mg/dL — ABNORMAL HIGH (ref 8–23)
CO2: 27 mmol/L (ref 22–32)
Calcium: 8 mg/dL — ABNORMAL LOW (ref 8.9–10.3)
Chloride: 103 mmol/L (ref 98–111)
Creatinine, Ser: 1.79 mg/dL — ABNORMAL HIGH (ref 0.61–1.24)
GFR, Estimated: 42 mL/min — ABNORMAL LOW (ref >60)
Glucose, Bld: 108 mg/dL — ABNORMAL HIGH (ref 70–99)
Potassium: 3.8 mmol/L (ref 3.5–5.1)
Sodium: 140 mmol/L (ref 135–145)

## 2021-09-21 LAB — MAGNESIUM: Magnesium: 2.4 mg/dL (ref 1.7–2.4)

## 2021-09-21 LAB — LACTIC ACID, PLASMA
Lactic Acid, Venous: 2.4 mmol/L (ref 0.5–1.9)
Lactic Acid, Venous: 1.8 mmol/L (ref 0.5–1.9)

## 2021-09-21 LAB — C DIFFICILE QUICK SCREEN W PCR REFLEX
C Diff antigen: NEGATIVE
C Diff interpretation: NOT DETECTED
C Diff toxin: NEGATIVE

## 2021-09-21 LAB — LEGIONELLA PNEUMOPHILA SEROGP 1 UR AG: L. pneumophila Serogp 1 Ur Ag: NEGATIVE

## 2021-09-21 MED ORDER — SODIUM CHLORIDE 0.9 % IV SOLN
250.0000 mL | INTRAVENOUS | Status: DC
  Administered 2021-09-24 – 2021-10-06 (×3): 250 mL via INTRAVENOUS

## 2021-09-21 MED ORDER — SODIUM CHLORIDE 0.9 % IV SOLN
500.0000 mg | INTRAVENOUS | Status: AC
  Administered 2021-09-21 – 2021-09-23 (×3): 500 mg via INTRAVENOUS
  Filled 2021-09-21: qty 5
  Filled 2021-09-21: qty 500
  Filled 2021-09-21: qty 5

## 2021-09-21 MED ORDER — LACTATED RINGERS IV BOLUS
500.0000 mL | Freq: Once | INTRAVENOUS | Status: AC
  Administered 2021-09-21: 500 mL via INTRAVENOUS

## 2021-09-21 MED ORDER — SODIUM CHLORIDE 0.9 % IV SOLN
2.0000 g | INTRAVENOUS | Status: DC
  Administered 2021-09-21 – 2021-09-23 (×3): 2 g via INTRAVENOUS
  Filled 2021-09-21: qty 20
  Filled 2021-09-21: qty 2
  Filled 2021-09-21 (×2): qty 20

## 2021-09-21 MED ORDER — NOREPINEPHRINE 4 MG/250ML-% IV SOLN
2.0000 ug/min | INTRAVENOUS | Status: DC
  Administered 2021-09-21: 2 ug/min via INTRAVENOUS
  Filled 2021-09-21: qty 250

## 2021-09-21 MED ORDER — INSULIN ASPART 100 UNIT/ML IJ SOLN
0.0000 [IU] | INTRAMUSCULAR | Status: DC
  Administered 2021-09-21 – 2021-09-26 (×12): 2 [IU] via SUBCUTANEOUS
  Administered 2021-09-27: 3 [IU] via SUBCUTANEOUS
  Administered 2021-09-27 (×3): 2 [IU] via SUBCUTANEOUS
  Administered 2021-09-27: 3 [IU] via SUBCUTANEOUS
  Administered 2021-09-27 – 2021-10-02 (×14): 2 [IU] via SUBCUTANEOUS
  Filled 2021-09-21 (×26): qty 1

## 2021-09-21 MED ORDER — GUAIFENESIN-DM 100-10 MG/5ML PO SYRP: 5.0000 mL | ORAL_SOLUTION | ORAL | Status: DC | PRN

## 2021-09-21 MED ORDER — LACTATED RINGERS IV BOLUS
1000.0000 mL | Freq: Once | INTRAVENOUS | Status: AC
  Administered 2021-09-21: 1000 mL via INTRAVENOUS

## 2021-09-21 NOTE — Progress Notes (Signed)
An USGPIV (ultrasound guided PIV) has been placed for short-term vasopressor infusion. A correctly placed ivWatch must be used when administering Vasopressors. Should this treatment be needed beyond 72 hours, central line access should be obtained.  It will be the responsibility of the bedside nurse to follow best practice to prevent extravasations.   ?

## 2021-09-21 NOTE — Progress Notes (Signed)
Updated pts sister [name] via telephone regarding plan of care and all questions were answered.  Will continue to monitor and assess pt  [name], [place] Pager [phone] (please enter 7 digits) PCCM Consult Pager [phone] (please enter 7 digits)

## 2021-09-21 NOTE — Progress Notes (Signed)
[place] Progress Note Patient Name: Gary [name] DOB: 07-01-1955 MRN: [id number]   Date of Service  09/21/2021  HPI/Events of Note  Multiple issues: 1. Watery stools. 2. Blood glucose = 111 - 135. BMI = 50.29. Creatinine = 1.60.   eICU Interventions  Plan: Place Flexiseal. Q 4 hour moderate Novolog SSI.     Intervention Category Major Interventions: Other:;Hyperglycemia - active titration of insulin therapy  Lysle Dingwall 09/21/2021, 2:32 AM
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
## 2021-09-22 DIAGNOSIS — J9622 Acute and chronic respiratory failure with hypercapnia: Secondary | ICD-10-CM | POA: Diagnosis not present

## 2021-09-22 DIAGNOSIS — J9621 Acute and chronic respiratory failure with hypoxia: Secondary | ICD-10-CM | POA: Diagnosis not present

## 2021-09-22 LAB — GASTROINTESTINAL PANEL BY PCR, STOOL (REPLACES STOOL CULTURE)

## 2021-09-22 LAB — CBC
HCT: 45.2 % (ref 39.0–52.0)
Hemoglobin: 12.7 g/dL — ABNORMAL LOW (ref 13.0–17.0)
MCH: 22.6 pg — ABNORMAL LOW (ref 26.0–34.0)
MCHC: 28.1 g/dL — ABNORMAL LOW (ref 30.0–36.0)
MCV: 80.3 fL (ref 80.0–100.0)
Platelets: 146 10*3/uL — ABNORMAL LOW (ref 150–400)
RBC: 5.63 MIL/uL (ref 4.22–5.81)
RDW: 18.3 % — ABNORMAL HIGH (ref 11.5–15.5)
WBC: 7.9 10*3/uL (ref 4.0–10.5)
nRBC: 0.9 % — ABNORMAL HIGH (ref 0.0–0.2)

## 2021-09-22 LAB — GLUCOSE, CAPILLARY
Glucose-Capillary: 113 mg/dL — ABNORMAL HIGH (ref 70–99)
Glucose-Capillary: 107 mg/dL — ABNORMAL HIGH (ref 70–99)
Glucose-Capillary: 90 mg/dL (ref 70–99)
Glucose-Capillary: 121 mg/dL — ABNORMAL HIGH (ref 70–99)
Glucose-Capillary: 126 mg/dL — ABNORMAL HIGH (ref 70–99)
Glucose-Capillary: 119 mg/dL — ABNORMAL HIGH (ref 70–99)

## 2021-09-22 LAB — BASIC METABOLIC PANEL
Anion gap: 11 (ref 5–15)
BUN: 74 mg/dL — ABNORMAL HIGH (ref 8–23)
CO2: 25 mmol/L (ref 22–32)
Calcium: 7.4 mg/dL — ABNORMAL LOW (ref 8.9–10.3)
Chloride: 103 mmol/L (ref 98–111)
Creatinine, Ser: 2.02 mg/dL — ABNORMAL HIGH (ref 0.61–1.24)
GFR, Estimated: 36 mL/min — ABNORMAL LOW (ref >60)
Glucose, Bld: 104 mg/dL — ABNORMAL HIGH (ref 70–99)
Potassium: 4 mmol/L (ref 3.5–5.1)
Sodium: 139 mmol/L (ref 135–145)

## 2021-09-22 LAB — PROCALCITONIN: Procalcitonin: 4.09 ng/mL

## 2021-09-22 LAB — PHOSPHORUS: Phosphorus: 6.1 mg/dL — ABNORMAL HIGH (ref 2.5–4.6)

## 2021-09-22 LAB — MAGNESIUM: Magnesium: 2.4 mg/dL (ref 1.7–2.4)

## 2021-09-22 MED ORDER — PROSOURCE TF PO LIQD
45.0000 mL | Freq: Four times a day (QID) | ORAL | Status: DC
  Administered 2021-09-22 – 2021-09-25 (×12): 45 mL

## 2021-09-22 MED ORDER — CALCIUM GLUCONATE-NACL 2-0.675 GM/100ML-% IV SOLN
2.0000 g | Freq: Once | INTRAVENOUS | Status: AC
Start: 2021-09-22 — End: 2021-09-22
  Administered 2021-09-22: 2000 mg via INTRAVENOUS
  Filled 2021-09-22: qty 100

## 2021-09-22 MED ORDER — VITAL HIGH PROTEIN PO LIQD
1000.0000 mL | ORAL | Status: DC
Start: 2021-09-22 — End: 2021-09-25
  Administered 2021-09-23 – 2021-09-25 (×3): 1000 mL

## 2021-09-22 MED ORDER — METHYLPREDNISOLONE SODIUM SUCC 40 MG IJ SOLR
20.0000 mg | Freq: Two times a day (BID) | INTRAMUSCULAR | Status: DC
Start: 2021-09-22 — End: 2021-09-25
  Administered 2021-09-22 – 2021-09-25 (×6): 20 mg via INTRAVENOUS
  Filled 2021-09-22 (×6): qty 1

## 2021-09-22 NOTE — Progress Notes (Signed)
Patient listed on the OR schedule for tracheostomy on 09/28/2021.  MD Kasa advised okay to restart patient's tube feeds and medication administration.
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
## 2021-09-22 NOTE — Progress Notes (Signed)
eLink Physician-Brief Progress Note Patient Name: Gary [name] DOB: [birth year] MRN: [id number]   Date of Service  09/22/2021  HPI/Events of Note  Nursing reports increased ventricular ectopy. K+ = 4.0 and Mg++ = 2.4. Ca++ = 7.4, PO4--- = 6.1 and Albumin - 3.1. .  eICU Interventions  Plan: Will replace Ca++.      Intervention Category Major Interventions: Arrhythmia - evaluation and management  [name] 09/22/2021, 3:15 AM

## 2021-09-22 NOTE — TOC Progression Note (Signed)
Transition of Care [facility]) - Progression Note    Patient Details  Name: Gary [name] MRN: [id number] Date of Birth: 12/20/1955  [facility]) CM/SW Contact  Shelbie Hutching, RN Phone Number: 09/22/2021, 10:50 AM  Clinical Narrative:    [facility]line Idol planned for 6/15.  [facility] [place] reported that they would consider patient for LTAC once he has the trach and once he has also followed commands and worked with PT. TOC will follow.    Expected Discharge Plan: Long Term Acute Care (LTAC) Barriers to Discharge: Continued Medical Work up  Expected Discharge Plan and Services Expected Discharge Plan: Long Term Acute Care (LTAC)   Discharge Planning Services: CM Consult Post Acute Care Choice: Long Term Acute Care (LTAC) Living arrangements for the past 2 months: Single Family Home                 DME Arranged: N/A DME Agency: NA       HH Arranged: NA HH Agency: NA         Social Determinants of Health (SDOH) Interventions    Readmission Risk Interventions    05/04/2021    1:26 PM 04/07/2021    1:39 PM 03/12/2021   11:54 AM  Readmission Risk Prevention Plan  Transportation Screening Complete Complete Complete  PCP or Specialist Appt within 3-5 Days  Complete Complete  HRI or [place]   Complete  Social Work Consult for [place] Planning/Counseling   Complete  Palliative Care Screening  Not Applicable Not Applicable  Medication Review Press photographer) Complete Complete Complete  PCP or Specialist appointment within 3-5 days of discharge Complete    HRI or [place] Complete    SW Recovery Care/Counseling Consult Complete    [place] Not Applicable
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
## 2021-09-22 NOTE — Progress Notes (Addendum)
Nutrition Follow Up Note   DOCUMENTATION CODES:   Morbid obesity  INTERVENTION:   Vital HP '@75ml' /hr + ProSource TF 45m QID via tube   Free water flushes 334mq4 hours to maintain tube patency   Propofol: 20.5 ml/hr- provides 541kcal/day   Regimen provides 2501kcal/day, 202g/day protein and 168483may of free water.   Will add vitamin D supplementation with diet advancement   NUTRITION DIAGNOSIS:   Inadequate oral intake related to inability to eat (pt sedated and ventilated) as evidenced by NPO status.  GOAL:   Provide needs based on ASPEN/SCCM guidelines -met   MONITOR:   Vent status, Labs, Weight trends, TF tolerance, Skin, I & O's  ASSESSMENT:   [age] male with h/o PUD, OSA, HTN, CKD III, substance abuse, CHF, Hep C and COPD and COVID 19 (02/2021) who is admitted with COPD exacerbation, CHF and AKI.  Pt remains sedated and ventilated. OGT in place. Pt tolerating tube feeds well at goal rate; will adjust rate in relation to propofol changes. Per chart, pt is up ~4lbs since admission; pt +1.7L on his I & Os. 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 Plan is for tracheostomy 6/15.    Medications reviewed and include: lovenox, insulin, MVI, solu-medrol, protonix, miralax, azithromycin, ceftriaxone, propofol   Labs reviewed: K 4.0 wnl, BUN 74(H), creat 2.02(H), P 6.1(H), Mg 2.4 wnl Vitamin D- 13.06(L)- 1/20 Cbgs- 121, 90, 107 x 24 hrs  Patient is currently intubated on ventilator support MV: 10.6 L/min Temp (24hrs), Avg:98.5 F (36.9 C), Min:98.1 F (36.7 C), Max:99.3 F (37.4 C)  Propofol: 20.5 ml/hr- provides 541kcal/day   MAP- >21m2m  UOP- 1570ml58m   Diet Order:    Diet Order             Diet NPO time specified  Diet effective now                  EDUCATION NEEDS:   No education needs have been identified at this time  Skin:  Skin Assessment: Reviewed RN Assessment  Last BM:  6/9- 400ml 56mrectal  tube  Height:   Ht Readings from Last 1 Encounters:  09/21/21 '6\' 3"'  (1.905 m)    Weight:   Wt Readings from Last 1 Encounters:  09/22/21 (!) 185.2 kg    Ideal Body Weight:  89 kg  BMI:  Body mass index is 51.03 kg/m.  Estimated Nutritional Needs:   Kcal:  2007-2555kcal/day  Protein:  178-225g/day  Fluid:  2.0L/day  Raechel Marcos Koleen DistanceD, LDN Please refer to [facility] and/or RD on-call/weekend/after hours pager
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
## 2021-09-23 ENCOUNTER — Inpatient Hospital Stay: Payer: Medicaid Other

## 2021-09-23 DIAGNOSIS — J9622 Acute and chronic respiratory failure with hypercapnia: Secondary | ICD-10-CM | POA: Diagnosis not present

## 2021-09-23 DIAGNOSIS — J9621 Acute and chronic respiratory failure with hypoxia: Secondary | ICD-10-CM | POA: Diagnosis not present

## 2021-09-23 LAB — CBC
HCT: 44 % (ref 39.0–52.0)
Hemoglobin: 12.8 g/dL — ABNORMAL LOW (ref 13.0–17.0)
MCH: 23 pg — ABNORMAL LOW (ref 26.0–34.0)
MCHC: 29.1 g/dL — ABNORMAL LOW (ref 30.0–36.0)
MCV: 79 fL — ABNORMAL LOW (ref 80.0–100.0)
Platelets: 153 10*3/uL (ref 150–400)
RBC: 5.57 MIL/uL (ref 4.22–5.81)
RDW: 17.8 % — ABNORMAL HIGH (ref 11.5–15.5)
WBC: 6.6 10*3/uL (ref 4.0–10.5)
nRBC: 0.8 % — ABNORMAL HIGH (ref 0.0–0.2)

## 2021-09-23 LAB — PHOSPHORUS: Phosphorus: 4.1 mg/dL (ref 2.5–4.6)

## 2021-09-23 LAB — BASIC METABOLIC PANEL
Anion gap: 10 (ref 5–15)
BUN: 67 mg/dL — ABNORMAL HIGH (ref 8–23)
CO2: 23 mmol/L (ref 22–32)
Calcium: 7.9 mg/dL — ABNORMAL LOW (ref 8.9–10.3)
Chloride: 103 mmol/L (ref 98–111)
Creatinine, Ser: 1.41 mg/dL — ABNORMAL HIGH (ref 0.61–1.24)
GFR, Estimated: 56 mL/min — ABNORMAL LOW (ref >60)
Glucose, Bld: 103 mg/dL — ABNORMAL HIGH (ref 70–99)
Potassium: 5.8 mmol/L — ABNORMAL HIGH (ref 3.5–5.1)
Sodium: 136 mmol/L (ref 135–145)

## 2021-09-23 LAB — NOROVIRUS GROUP 1 & 2 BY PCR, STOOL
Norovirus 1 by PCR: NEGATIVE
Norovirus 2  by PCR: NEGATIVE

## 2021-09-23 LAB — GLUCOSE, CAPILLARY
Glucose-Capillary: 104 mg/dL — ABNORMAL HIGH (ref 70–99)
Glucose-Capillary: 109 mg/dL — ABNORMAL HIGH (ref 70–99)
Glucose-Capillary: 109 mg/dL — ABNORMAL HIGH (ref 70–99)
Glucose-Capillary: 121 mg/dL — ABNORMAL HIGH (ref 70–99)
Glucose-Capillary: 124 mg/dL — ABNORMAL HIGH (ref 70–99)
Glucose-Capillary: 94 mg/dL (ref 70–99)

## 2021-09-23 LAB — POTASSIUM: Potassium: 4 mmol/L (ref 3.5–5.1)

## 2021-09-23 LAB — MAGNESIUM: Magnesium: 2.6 mg/dL — ABNORMAL HIGH (ref 1.7–2.4)

## 2021-09-23 LAB — TRIGLYCERIDES: Triglycerides: 623 mg/dL — ABNORMAL HIGH (ref <150)

## 2021-09-23 LAB — PROCALCITONIN: Procalcitonin: 0.88 ng/mL

## 2021-09-23 MED ORDER — CALCIUM GLUCONATE-NACL 2-0.675 GM/100ML-% IV SOLN: 2.0000 g | Freq: Once | INTRAVENOUS | Status: DC

## 2021-09-23 MED ORDER — DEXTROSE 50 % IV SOLN
1.0000 | Freq: Once | INTRAVENOUS | Status: AC
  Administered 2021-09-23: 50 mL via INTRAVENOUS
  Filled 2021-09-23: qty 50

## 2021-09-23 MED ORDER — LORAZEPAM 2 MG/ML IJ SOLN
INTRAMUSCULAR | Status: AC
  Administered 2021-09-23: 4 mg via INTRAVENOUS
  Filled 2021-09-23: qty 2

## 2021-09-23 MED ORDER — STERILE WATER FOR INJECTION IJ SOLN
INTRAMUSCULAR | Status: AC
  Administered 2021-09-23: 10 mL
  Filled 2021-09-23: qty 10

## 2021-09-23 MED ORDER — LORAZEPAM 2 MG/ML IJ SOLN: 4.0000 mg | INTRAMUSCULAR | Status: AC

## 2021-09-23 MED ORDER — INSULIN ASPART 100 UNIT/ML IV SOLN
10.0000 [IU] | Freq: Once | INTRAVENOUS | Status: AC
Start: 2021-09-23 — End: 2021-09-23
  Administered 2021-09-23: 10 [IU] via INTRAVENOUS
  Filled 2021-09-23: qty 0.1

## 2021-09-23 MED ORDER — MIDAZOLAM-SODIUM CHLORIDE 100-0.9 MG/100ML-% IV SOLN
0.5000 mg/h | INTRAVENOUS | Status: DC
  Administered 2021-09-23: 0.5 mg/h via INTRAVENOUS
  Administered 2021-09-23 – 2021-09-24 (×2): 8 mg/h via INTRAVENOUS
  Administered 2021-09-24 – 2021-09-25 (×2): 9 mg/h via INTRAVENOUS
  Administered 2021-09-25: 8 mg/h via INTRAVENOUS
  Filled 2021-09-23 (×5): qty 100

## 2021-09-23 NOTE — Progress Notes (Signed)
[facility] for Electrolyte Monitoring and Replacement   Recent Labs: Potassium (mmol/L)  Date Value  09/23/2021 5.8 (H)  04/15/2014 3.9   Magnesium (mg/dL)  Date Value  09/23/2021 2.6 (H)  08/21/2012 1.9   Calcium (mg/dL)  Date Value  09/23/2021 7.9 (L)   Calcium, Total (mg/dL)  Date Value  04/15/2014 8.3 (L)   Albumin (g/dL)  Date Value  09/19/2021 3.1 (L)  04/15/2014 2.9 (L)   Phosphorus (mg/dL)  Date Value  09/23/2021 4.1   Sodium (mmol/L)  Date Value  09/23/2021 136  03/15/2020 145 (H)  04/15/2014 140     Assessment: 66 y/o male with h/o PUD, OSA, HTN, CKD III, substance abuse, CHF, Hep C and COPD and COVID 19 (02/2021) who is admitted with COPD exacerbation, CHF and AKI. Pharmacy is asked to follow and replace electrolytes while in CCU  Goal of Therapy:  Electrolytes WNL  Plan:  Hyperkalemia: 10 units IV insulin x 1 + D50 Recheck potassium at 12 pm  Dallie Piles ,PharmD Clinical Pharmacist 09/23/2021 9:06 AM
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
## 2021-09-23 NOTE — Progress Notes (Signed)
[facility] for Electrolyte Monitoring and Replacement   Recent Labs: Potassium (mmol/L)  Date Value  09/23/2021 4.0  04/15/2014 3.9   Magnesium (mg/dL)  Date Value  09/23/2021 2.6 (H)  08/21/2012 1.9   Calcium (mg/dL)  Date Value  09/23/2021 7.9 (L)   Calcium, Total (mg/dL)  Date Value  04/15/2014 8.3 (L)   Albumin (g/dL)  Date Value  09/19/2021 3.1 (L)  04/15/2014 2.9 (L)   Phosphorus (mg/dL)  Date Value  09/23/2021 4.1   Sodium (mmol/L)  Date Value  09/23/2021 136  03/15/2020 145 (H)  04/15/2014 140     Assessment: 66 y/o male with h/o PUD, OSA, HTN, CKD III, substance abuse, CHF, Hep C and COPD and COVID 19 (02/2021) who is admitted with COPD exacerbation, CHF and AKI. Pharmacy is asked to follow and replace electrolytes while in CCU  Goal of Therapy:  Electrolytes WNL  Plan:  Hyperkalemia: resolved Recheck electrolytes in am  Dallie Piles ,PharmD Clinical Pharmacist 09/23/2021 1:21 PM

## 2021-09-24 DIAGNOSIS — J9622 Acute and chronic respiratory failure with hypercapnia: Secondary | ICD-10-CM | POA: Diagnosis not present

## 2021-09-24 DIAGNOSIS — J9621 Acute and chronic respiratory failure with hypoxia: Secondary | ICD-10-CM | POA: Diagnosis not present

## 2021-09-24 LAB — BASIC METABOLIC PANEL
Anion gap: 5 (ref 5–15)
BUN: 61 mg/dL — ABNORMAL HIGH (ref 8–23)
CO2: 28 mmol/L (ref 22–32)
Calcium: 8.1 mg/dL — ABNORMAL LOW (ref 8.9–10.3)
Chloride: 108 mmol/L (ref 98–111)
Creatinine, Ser: 1.15 mg/dL (ref 0.61–1.24)
GFR, Estimated: >60 mL/min (ref >60)
Glucose, Bld: 115 mg/dL — ABNORMAL HIGH (ref 70–99)
Potassium: 4 mmol/L (ref 3.5–5.1)
Sodium: 141 mmol/L (ref 135–145)

## 2021-09-24 LAB — CBC
HCT: 42.9 % (ref 39.0–52.0)
Hemoglobin: 12.2 g/dL — ABNORMAL LOW (ref 13.0–17.0)
MCH: 22.5 pg — ABNORMAL LOW (ref 26.0–34.0)
MCHC: 28.4 g/dL — ABNORMAL LOW (ref 30.0–36.0)
MCV: 79.2 fL — ABNORMAL LOW (ref 80.0–100.0)
Platelets: 138 10*3/uL — ABNORMAL LOW (ref 150–400)
RBC: 5.42 MIL/uL (ref 4.22–5.81)
RDW: 17.5 % — ABNORMAL HIGH (ref 11.5–15.5)
WBC: 6.2 10*3/uL (ref 4.0–10.5)
nRBC: 0.6 % — ABNORMAL HIGH (ref 0.0–0.2)

## 2021-09-24 LAB — GLUCOSE, CAPILLARY
Glucose-Capillary: 106 mg/dL — ABNORMAL HIGH (ref 70–99)
Glucose-Capillary: 113 mg/dL — ABNORMAL HIGH (ref 70–99)
Glucose-Capillary: 115 mg/dL — ABNORMAL HIGH (ref 70–99)
Glucose-Capillary: 116 mg/dL — ABNORMAL HIGH (ref 70–99)
Glucose-Capillary: 120 mg/dL — ABNORMAL HIGH (ref 70–99)
Glucose-Capillary: 121 mg/dL — ABNORMAL HIGH (ref 70–99)
Glucose-Capillary: 94 mg/dL (ref 70–99)

## 2021-09-24 LAB — CULTURE, RESPIRATORY W GRAM STAIN

## 2021-09-24 LAB — PHOSPHORUS: Phosphorus: 2.8 mg/dL (ref 2.5–4.6)

## 2021-09-24 LAB — MAGNESIUM: Magnesium: 2.8 mg/dL — ABNORMAL HIGH (ref 1.7–2.4)

## 2021-09-24 MED ORDER — QUETIAPINE FUMARATE 25 MG PO TABS
25.0000 mg | ORAL_TABLET | Freq: Every day | ORAL | Status: DC
  Administered 2021-09-24: 25 mg
  Filled 2021-09-24: qty 1

## 2021-09-24 MED ORDER — VECURONIUM BROMIDE 10 MG IV SOLR
10.0000 mg | INTRAVENOUS | Status: DC | PRN
  Administered 2021-09-24: 10 mg via INTRAVENOUS
  Filled 2021-09-24: qty 10

## 2021-09-24 MED ORDER — OXYCODONE HCL 5 MG/5ML PO SOLN
5.0000 mg | Freq: Three times a day (TID) | ORAL | Status: DC
  Administered 2021-09-24 – 2021-09-25 (×2): 5 mg
  Filled 2021-09-24 (×2): qty 5

## 2021-09-24 MED ORDER — GLYCOPYRROLATE 0.2 MG/ML IJ SOLN
0.4000 mg | Freq: Three times a day (TID) | INTRAMUSCULAR | Status: DC
  Administered 2021-09-24 – 2021-09-25 (×4): 0.4 mg via INTRAVENOUS
  Filled 2021-09-24 (×4): qty 2

## 2021-09-24 MED ORDER — VECURONIUM BROMIDE 10 MG IV SOLR
10.0000 mg | Freq: Once | INTRAVENOUS | Status: AC
Start: 2021-09-24 — End: 2021-09-24
  Administered 2021-09-24: 10 mg via INTRAVENOUS
  Filled 2021-09-24: qty 10

## 2021-09-24 MED ORDER — SODIUM CHLORIDE 0.9 % IV SOLN
2.0000 g | Freq: Three times a day (TID) | INTRAVENOUS | Status: DC
  Administered 2021-09-24 – 2021-09-26 (×6): 2 g via INTRAVENOUS
  Filled 2021-09-24 (×4): qty 12.5
  Filled 2021-09-24: qty 2
  Filled 2021-09-24 (×2): qty 12.5

## 2021-09-24 MED ORDER — DIAZEPAM 2 MG PO TABS
2.0000 mg | ORAL_TABLET | Freq: Three times a day (TID) | ORAL | Status: DC
  Administered 2021-09-24 – 2021-09-25 (×2): 2 mg
  Filled 2021-09-24 (×2): qty 1
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
## 2021-09-24 NOTE — Progress Notes (Signed)
Pharmacy Antibiotic Note  Gary [name] is a 66 y.o. male with h/o PUD, OSA, HTN, CKD III, substance abuse, CHF, Hep C and COPD and COVID 19 (02/2021) admitted on 09/19/2021 with acute decompensated HFpEF and AECOPD requiring intubation and mechanical ventilation.  Pharmacy has been consulted for cefepime dosing.  Plan: start cefepime 2 grams IV every 8 hours  Height: '6\' 3"'$  (190.5 cm) Weight: (!) 173 kg (381 lb 6.3 oz) IBW/kg (Calculated) : 84.5  Temp (24hrs), Avg:97.7 F (36.5 C), Min:97.2 F (36.2 C), Max:99.1 F (37.3 C)  Recent Labs  Lab 09/20/21 0430 09/21/21 0327 09/21/21 1228 09/21/21 1518 09/22/21 0141 09/23/21 0704 09/24/21 0441  WBC 10.2 5.0  --   --  7.9 6.6 6.2  CREATININE 1.60* 1.79*  --   --  2.02* 1.41* 1.15  LATICACIDVEN  --   --  2.4* 1.8  --   --   --     Estimated Creatinine Clearance: 110.1 mL/min (by C-G formula based on SCr of 1.15 mg/dL).    No Known Allergies  Antimicrobials this admission: 06/08 azithromycin >> 06/11 06/08 ceftriaxone >> 06/11 06/11 cefepime >>  Microbiology results: 06/08 BCx: NGTD  06/08 C diff: negative 06/06 Sputum: P aeruginosa (susceptibilities pending)  06/08 GI panel: Yersinia enterocolitica 06/06 MRSA PCR: not detected  Thank you for allowing pharmacy to be a part of this patient's care.  Dallie Piles 09/24/2021 9:44 AM

## 2021-09-24 NOTE — Plan of Care (Signed)
Difficult to manage comfort and sedation this shift. Agitation, coughing, and ventilator dyssynchrony, elevated peak pressures with desaturation into 60's was noted. Pt also responsive and nodded affirming pain, with increased BP. Was able to blink eyes on command. Required frequent repositioning and boosting up in bed due to agitation. Breath sounds with rhonci throughout. Copious oral secretions noted with oral suctioning greater than every 1 hour frequency. ETT suctioned frequently, at least every 2 hours. Fentanyl (now at 200 mcg) and Versed ('9mg'$ ) infusions have been titrated upwards and pt received fent bolus x 4 and versed bolus x 2. Pt also received Vercuronium x 2. Discussed with Dr. Mortimer Fries, and pain med and sedation orders added via OG tube. PRN vecuronimum ordered and fentanyl infusion orders adjusted. Skin intact except for right inguinal fold with skin tear. Gauze changed and interdry changed. Flexiseal leaking but also draining, 175 cc this shift. TF at goal 75 cc per hour. BP stable, no longer requires levophed. UOP decreased, discussed with Dr. Mortimer Fries, but no additional interventions at this time, as renal function has improved. Afebrile. Generalized edema noted, 1-2+, peripheral pulses palpable 2+. CBG's required no sliding scale coverage today.  Problem: Education: Goal: Ability to demonstrate management of disease process will improve Outcome: Not Progressing Goal: Ability to verbalize understanding of medication therapies will improve Outcome: Not Progressing Goal: Individualized Educational Video(s) Outcome: Not Progressing   Problem: Activity: Goal: Capacity to carry out activities will improve Outcome: Not Progressing   Problem: Cardiac: Goal: Ability to achieve and maintain adequate cardiopulmonary perfusion will improve Outcome: Progressing   Problem: Education: Goal: Knowledge of disease or condition will improve Outcome: Not Progressing Goal: Knowledge of the prescribed  therapeutic regimen will improve Outcome: Not Progressing Goal: Individualized Educational Video(s) Outcome: Not Progressing   Problem: Activity: Goal: Ability to tolerate increased activity will improve Outcome: Not Progressing Goal: Will verbalize the importance of balancing activity with adequate rest periods Outcome: Not Progressing   Problem: Respiratory: Goal: Ability to maintain a clear airway will improve Outcome: Not Progressing Goal: Levels of oxygenation will improve Outcome: Not Progressing Goal: Ability to maintain adequate ventilation will improve Outcome: Not Progressing   Problem: Education: Goal: Knowledge of General Education information will improve Description: Including pain rating scale, medication(s)/side effects and non-pharmacologic comfort measures Outcome: Not Progressing   Problem: Health Behavior/Discharge Planning: Goal: Ability to manage health-related needs will improve Outcome: Not Progressing   Problem: Clinical Measurements: Goal: Ability to maintain clinical measurements within normal limits will improve Outcome: Not Progressing Goal: Will remain free from infection Outcome: Progressing Goal: Diagnostic test results will improve Outcome: Progressing Goal: Respiratory complications will improve Outcome: Progressing Goal: Cardiovascular complication will be avoided Outcome: Progressing   Problem: Activity: Goal: Risk for activity intolerance will decrease Outcome: Not Progressing   Problem: Nutrition: Goal: Adequate nutrition will be maintained Outcome: Progressing   Problem: Coping: Goal: Level of anxiety will decrease Outcome: Not Progressing   Problem: Elimination: Goal: Will not experience complications related to bowel motility Outcome: Not Progressing Goal: Will not experience complications related to urinary retention Outcome: Not Progressing   Problem: Pain Managment: Goal: General experience of comfort will  improve Outcome: Not Progressing   Problem: Safety: Goal: Ability to remain free from injury will improve Outcome: Not Progressing   Problem: Skin Integrity: Goal: Risk for impaired skin integrity will decrease Outcome: Progressing   Problem: Education: Goal: Ability to describe self-care measures that may prevent or decrease complications (Diabetes Survival Skills Education) will improve Outcome: Progressing  Goal: Individualized Educational Video(s) Outcome: Not Progressing   Problem: Coping: Goal: Ability to adjust to condition or change in health will improve Outcome: Not Progressing   Problem: Fluid Volume: Goal: Ability to maintain a balanced intake and output will improve Outcome: Not Progressing   Problem: Health Behavior/Discharge Planning: Goal: Ability to identify and utilize available resources and services will improve Outcome: Not Progressing Goal: Ability to manage health-related needs will improve Outcome: Not Progressing   Problem: Metabolic: Goal: Ability to maintain appropriate glucose levels will improve Outcome: Progressing   Problem: Nutritional: Goal: Maintenance of adequate nutrition will improve Outcome: Progressing Goal: Progress toward achieving an optimal weight will improve Outcome: Not Progressing   Problem: Skin Integrity: Goal: Risk for impaired skin integrity will decrease Outcome: Progressing   Problem: Tissue Perfusion: Goal: Adequacy of tissue perfusion will improve Outcome: Progressing

## 2021-09-24 NOTE — Progress Notes (Signed)
PHARMACY CONSULT NOTE  Pharmacy Consult for Electrolyte Monitoring and Replacement   Recent Labs: Potassium (mmol/L)  Date Value  09/24/2021 4.0  04/15/2014 3.9   Magnesium (mg/dL)  Date Value  09/24/2021 2.8 (H)  08/21/2012 1.9   Calcium (mg/dL)  Date Value  09/24/2021 8.1 (L)   Calcium, Total (mg/dL)  Date Value  04/15/2014 8.3 (L)   Albumin (g/dL)  Date Value  09/19/2021 3.1 (L)  04/15/2014 2.9 (L)   Phosphorus (mg/dL)  Date Value  09/24/2021 2.8   Sodium (mmol/L)  Date Value  09/24/2021 141  03/15/2020 145 (H)  04/15/2014 140   Corrected Ca: 9.0 mg/dL  Assessment: 66 y/o male with h/o PUD, OSA, HTN, CKD III, substance abuse, CHF, Hep C and COPD and COVID 19 (02/2021) who is admitted with COPD exacerbation, CHF and AKI. Pharmacy is asked to follow and replace electrolytes while in CCU  Goal of Therapy:  Electrolytes WNL  Plan:  No electrolyte replacement warranted for today Recheck electrolytes in am  Dallie Piles ,PharmD Clinical Pharmacist 09/24/2021 9:42 AM

## 2021-09-25 DIAGNOSIS — J9621 Acute and chronic respiratory failure with hypoxia: Secondary | ICD-10-CM | POA: Diagnosis not present

## 2021-09-25 DIAGNOSIS — J9622 Acute and chronic respiratory failure with hypercapnia: Secondary | ICD-10-CM | POA: Diagnosis not present

## 2021-09-25 LAB — BASIC METABOLIC PANEL
Anion gap: 9 (ref 5–15)
BUN: 90 mg/dL — ABNORMAL HIGH (ref 8–23)
CO2: 24 mmol/L (ref 22–32)
Calcium: 8.5 mg/dL — ABNORMAL LOW (ref 8.9–10.3)
Chloride: 109 mmol/L (ref 98–111)
Creatinine, Ser: 2.2 mg/dL — ABNORMAL HIGH (ref 0.61–1.24)
GFR, Estimated: 33 mL/min — ABNORMAL LOW (ref >60)
Glucose, Bld: 115 mg/dL — ABNORMAL HIGH (ref 70–99)
Potassium: 4.5 mmol/L (ref 3.5–5.1)
Sodium: 142 mmol/L (ref 135–145)

## 2021-09-25 LAB — CBC
HCT: 45.5 % (ref 39.0–52.0)
Hemoglobin: 12.9 g/dL — ABNORMAL LOW (ref 13.0–17.0)
MCH: 22.6 pg — ABNORMAL LOW (ref 26.0–34.0)
MCHC: 28.4 g/dL — ABNORMAL LOW (ref 30.0–36.0)
MCV: 79.8 fL — ABNORMAL LOW (ref 80.0–100.0)
Platelets: 166 10*3/uL (ref 150–400)
RBC: 5.7 MIL/uL (ref 4.22–5.81)
RDW: 17.6 % — ABNORMAL HIGH (ref 11.5–15.5)
WBC: 10.2 10*3/uL (ref 4.0–10.5)
nRBC: 0 % (ref 0.0–0.2)

## 2021-09-25 LAB — GLUCOSE, CAPILLARY
Glucose-Capillary: 108 mg/dL — ABNORMAL HIGH (ref 70–99)
Glucose-Capillary: 105 mg/dL — ABNORMAL HIGH (ref 70–99)
Glucose-Capillary: 115 mg/dL — ABNORMAL HIGH (ref 70–99)
Glucose-Capillary: 123 mg/dL — ABNORMAL HIGH (ref 70–99)
Glucose-Capillary: 133 mg/dL — ABNORMAL HIGH (ref 70–99)
Glucose-Capillary: 95 mg/dL (ref 70–99)

## 2021-09-25 LAB — PHOSPHORUS: Phosphorus: 4.3 mg/dL (ref 2.5–4.6)

## 2021-09-25 LAB — MAGNESIUM: Magnesium: 2.6 mg/dL — ABNORMAL HIGH (ref 1.7–2.4)

## 2021-09-25 MED ORDER — FREE WATER
50.0000 mL | Status: DC
  Administered 2021-09-25 – 2021-09-29 (×18): 50 mL

## 2021-09-25 MED ORDER — DIAZEPAM 5 MG PO TABS
5.0000 mg | ORAL_TABLET | Freq: Three times a day (TID) | ORAL | Status: DC
  Administered 2021-09-25 – 2021-09-27 (×8): 5 mg
  Filled 2021-09-25 (×8): qty 1

## 2021-09-25 MED ORDER — STERILE WATER FOR INJECTION IJ SOLN
INTRAMUSCULAR | Status: AC
  Administered 2021-09-25: 1 mL
  Filled 2021-09-25: qty 10

## 2021-09-25 MED ORDER — PROSOURCE TF PO LIQD
90.0000 mL | Freq: Every day | ORAL | Status: DC
  Administered 2021-09-25 – 2021-09-29 (×15): 90 mL
  Filled 2021-09-25 (×20): qty 90

## 2021-09-25 MED ORDER — VITAL 1.5 CAL PO LIQD
1000.0000 mL | ORAL | Status: DC
  Administered 2021-09-25 – 2021-09-28 (×3): 1000 mL

## 2021-09-25 MED ORDER — QUETIAPINE FUMARATE 25 MG PO TABS
50.0000 mg | ORAL_TABLET | Freq: Every day | ORAL | Status: DC
Start: 2021-09-25 — End: 2021-10-01
  Administered 2021-09-25 – 2021-09-30 (×6): 50 mg
  Filled 2021-09-25 (×6): qty 2

## 2021-09-25 MED ORDER — OXYCODONE HCL 5 MG/5ML PO SOLN
10.0000 mg | Freq: Three times a day (TID) | ORAL | Status: DC
  Administered 2021-09-25 – 2021-10-02 (×22): 10 mg
  Filled 2021-09-25 (×22): qty 10

## 2021-09-25 NOTE — Progress Notes (Signed)
Updated pt's sister [name] via telephone.  Discussed ongoing treatment for COPD & PNA while awaiting Trach placement on Thursday.  Also following renal function closely with worsening of Creatinine and UOP today.  All questions answered to her satisfaction.   Darel Hong, AGACNP-BC [facility] Prefer epic messenger for cross cover needs If after hours, please call E-link

## 2021-09-25 NOTE — Progress Notes (Signed)
Nutrition Follow Up Note   DOCUMENTATION CODES:   Morbid obesity  INTERVENTION:   Change to Vital 1.5 @60ml/hr + ProSource TF 90ml five times daily via tube   Free water flushes 50ml q4 hours to maintain tube patency   Regimen provides 2560kcal/day, 207g/day protein and 1400ml/day of free water.   Will add vitamin D supplementation with diet advancement   NUTRITION DIAGNOSIS:   Inadequate oral intake related to inability to eat (pt sedated and ventilated) as evidenced by NPO status.  GOAL:   Provide needs based on ASPEN/SCCM guidelines -met   MONITOR:   Vent status, Labs, Weight trends, TF tolerance, Skin, I & O's  ASSESSMENT:   65 y/o male with h/o PUD, OSA, HTN, CKD III, substance abuse, CHF, Hep C and COPD and COVID 19 (02/2021) who is admitted with COPD exacerbation, CHF and AKI. 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  Pt remains sedated and ventilated. OGT in place. Pt tolerating tube feeds well at goal rate; will adjust formula and rate in relation to propofol changes. Per chart, pt is up ~3lbs since admission; pt +8.8L on his I & Os. UOP significantly decreased. Pt with continued diarrhea. Plan is for tracheostomy 6/15.    Medications reviewed and include: lovenox, insulin, oxycodone, protonix, miralax, cefepime  Labs reviewed: K 4.5 wnl, BUN 90(H), creat 2.20(H), P 4.3, Mg 2.6(H) Vitamin D- 13.06(L)- 1/20 Cbgs- 115, 105, 108 x 24 hrs  Patient is currently intubated on ventilator support MV: 7.9 L/min Temp (24hrs), Avg:98.6 F (37 C), Min:98.2 F (36.8 C), Max:99.3 F (37.4 C)  Propofol: none  MAP- >65mmHg   UOP- 60ml/day   Diet Order:    Diet Order             Diet NPO time specified  Diet effective now                  EDUCATION NEEDS:   No education needs have been identified at this time  Skin:  Skin Assessment: Reviewed RN Assessment  Last BM:  6/12- TYPE 7  Height:   Ht Readings from Last 1 Encounters:   09/21/21 6' 3" (1.905 m)    Weight:   Wt Readings from Last 1 Encounters:  09/25/21 (!) 184.9 kg    Ideal Body Weight:  89 kg  BMI:  Body mass index is 50.95 kg/m.  Estimated Nutritional Needs:   Kcal:  2007-2555kcal/day  Protein:  178-225g/day  Fluid:  2.0L/day    MS, RD, LDN Please refer to AMION for RD and/or RD on-call/weekend/after hours pager 
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
## 2021-09-25 NOTE — Progress Notes (Signed)
Patient rested well overnight. No issues with behaviors. Tolerated meds well. Turned and repositioned. Prevalon boots applied to bilateral feet. Sedation remained the same and no boluses or extra medications necessary to calm the patient. Will continue to monitor.

## 2021-09-25 NOTE — Progress Notes (Signed)
Patient has increased Auto PEEP this morning. Increased set peep to 8cm and decreased I-time to 0.8. MD notified.
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
## 2021-09-26 ENCOUNTER — Inpatient Hospital Stay: Payer: Medicaid Other

## 2021-09-26 DIAGNOSIS — J9622 Acute and chronic respiratory failure with hypercapnia: Secondary | ICD-10-CM | POA: Diagnosis not present

## 2021-09-26 DIAGNOSIS — J9621 Acute and chronic respiratory failure with hypoxia: Secondary | ICD-10-CM | POA: Diagnosis not present

## 2021-09-26 LAB — CBC
HCT: 48.9 % (ref 39.0–52.0)
Hemoglobin: 13.4 g/dL (ref 13.0–17.0)
MCH: 22.4 pg — ABNORMAL LOW (ref 26.0–34.0)
MCHC: 27.4 g/dL — ABNORMAL LOW (ref 30.0–36.0)
MCV: 81.8 fL (ref 80.0–100.0)
Platelets: 117 10*3/uL — ABNORMAL LOW (ref 150–400)
RBC: 5.98 MIL/uL — ABNORMAL HIGH (ref 4.22–5.81)
RDW: 18.8 % — ABNORMAL HIGH (ref 11.5–15.5)
WBC: 7.8 10*3/uL (ref 4.0–10.5)
nRBC: 0.3 % — ABNORMAL HIGH (ref 0.0–0.2)

## 2021-09-26 LAB — CULTURE, BLOOD (ROUTINE X 2)
Culture: NO GROWTH
Culture: NO GROWTH
Special Requests: ADEQUATE
Special Requests: ADEQUATE

## 2021-09-26 LAB — BASIC METABOLIC PANEL
Anion gap: 13 (ref 5–15)
BUN: 163 mg/dL — ABNORMAL HIGH (ref 8–23)
CO2: 21 mmol/L — ABNORMAL LOW (ref 22–32)
Calcium: 8.1 mg/dL — ABNORMAL LOW (ref 8.9–10.3)
Chloride: 105 mmol/L (ref 98–111)
Creatinine, Ser: 4.15 mg/dL — ABNORMAL HIGH (ref 0.61–1.24)
GFR, Estimated: 15 mL/min — ABNORMAL LOW (ref >60)
Glucose, Bld: 117 mg/dL — ABNORMAL HIGH (ref 70–99)
Potassium: 5.7 mmol/L — ABNORMAL HIGH (ref 3.5–5.1)
Sodium: 139 mmol/L (ref 135–145)

## 2021-09-26 LAB — GLUCOSE, CAPILLARY
Glucose-Capillary: 100 mg/dL — ABNORMAL HIGH (ref 70–99)
Glucose-Capillary: 127 mg/dL — ABNORMAL HIGH (ref 70–99)
Glucose-Capillary: 127 mg/dL — ABNORMAL HIGH (ref 70–99)
Glucose-Capillary: 134 mg/dL — ABNORMAL HIGH (ref 70–99)
Glucose-Capillary: 146 mg/dL — ABNORMAL HIGH (ref 70–99)
Glucose-Capillary: 147 mg/dL — ABNORMAL HIGH (ref 70–99)
Glucose-Capillary: 59 mg/dL — ABNORMAL LOW (ref 70–99)

## 2021-09-26 LAB — PHOSPHORUS: Phosphorus: 7.8 mg/dL — ABNORMAL HIGH (ref 2.5–4.6)

## 2021-09-26 LAB — MAGNESIUM: Magnesium: 2.7 mg/dL — ABNORMAL HIGH (ref 1.7–2.4)

## 2021-09-26 LAB — HEPATITIS C ANTIBODY: HCV Ab: REACTIVE — AB

## 2021-09-26 LAB — HEPATITIS B CORE ANTIBODY, TOTAL: Hep B Core Total Ab: REACTIVE — AB

## 2021-09-26 LAB — HEPATITIS B SURFACE ANTIBODY,QUALITATIVE: Hep B S Ab: NONREACTIVE

## 2021-09-26 LAB — HEPATITIS B SURFACE ANTIGEN: Hepatitis B Surface Ag: NONREACTIVE

## 2021-09-26 MED ORDER — NUTRISOURCE FIBER PO PACK
1.0000 | PACK | Freq: Two times a day (BID) | ORAL | Status: DC
  Administered 2021-09-26 – 2021-09-27 (×4): 1
  Filled 2021-09-26 (×5): qty 1

## 2021-09-26 MED ORDER — PENTAFLUOROPROP-TETRAFLUOROETH EX AERO: 1.0000 "application " | INHALATION_SPRAY | CUTANEOUS | Status: DC | PRN

## 2021-09-26 MED ORDER — SODIUM CHLORIDE 0.9 % IV SOLN: 2.0000 g | Freq: Two times a day (BID) | INTRAVENOUS | Status: DC

## 2021-09-26 MED ORDER — LIDOCAINE-PRILOCAINE 2.5-2.5 % EX CREA: 1.0000 "application " | TOPICAL_CREAM | CUTANEOUS | Status: DC | PRN

## 2021-09-26 MED ORDER — LIDOCAINE HCL (PF) 1 % IJ SOLN
5.0000 mL | INTRAMUSCULAR | Status: DC | PRN
  Filled 2021-09-26: qty 5

## 2021-09-26 MED ORDER — HEPARIN SODIUM (PORCINE) 5000 UNIT/ML IJ SOLN
5000.0000 [IU] | Freq: Three times a day (TID) | INTRAMUSCULAR | Status: AC
  Administered 2021-09-26 – 2021-09-27 (×4): 5000 [IU] via SUBCUTANEOUS
  Filled 2021-09-26 (×4): qty 1

## 2021-09-26 MED ORDER — MIDAZOLAM HCL 2 MG/2ML IJ SOLN
2.0000 mg | Freq: Once | INTRAMUSCULAR | Status: AC
  Administered 2021-09-26: 2 mg via INTRAVENOUS

## 2021-09-26 MED ORDER — SODIUM ZIRCONIUM CYCLOSILICATE 5 G PO PACK
10.0000 g | PACK | Freq: Once | ORAL | Status: AC
  Administered 2021-09-26: 10 g
  Filled 2021-09-26: qty 2

## 2021-09-26 MED ORDER — SODIUM CHLORIDE 0.9 % IV SOLN
1.0000 g | INTRAVENOUS | Status: DC
  Administered 2021-09-26 – 2021-09-28 (×3): 1 g via INTRAVENOUS
  Filled 2021-09-26 (×2): qty 1
  Filled 2021-09-26: qty 10

## 2021-09-26 MED ORDER — HEPARIN SODIUM (PORCINE) 1000 UNIT/ML IJ SOLN
INTRAMUSCULAR | Status: AC
  Administered 2021-09-26: 2800 [IU] via INTRAVENOUS_CENTRAL
  Filled 2021-09-26: qty 10

## 2021-09-26 MED ORDER — RENA-VITE PO TABS
1.0000 | ORAL_TABLET | Freq: Every day | ORAL | Status: DC
  Administered 2021-09-26 – 2021-10-25 (×27): 1
  Filled 2021-09-26 (×27): qty 1

## 2021-09-26 MED ORDER — HEPARIN SODIUM (PORCINE) 1000 UNIT/ML DIALYSIS
1000.0000 [IU] | INTRAMUSCULAR | Status: DC | PRN
  Administered 2021-10-02 – 2021-10-11 (×2): 1000 [IU] via INTRAVENOUS_CENTRAL

## 2021-09-26 MED ORDER — ALTEPLASE 2 MG IJ SOLR: 2.0000 mg | Freq: Once | INTRAMUSCULAR | Status: DC | PRN

## 2021-09-26 NOTE — Progress Notes (Signed)
[place] for Electrolyte Monitoring and Replacement   Recent Labs: Potassium (mmol/L)  Date Value  09/26/2021 5.7 (H)  04/15/2014 3.9   Magnesium (mg/dL)  Date Value  09/26/2021 2.7 (H)  08/21/2012 1.9   Calcium (mg/dL)  Date Value  09/26/2021 8.1 (L)   Calcium, Total (mg/dL)  Date Value  04/15/2014 8.3 (L)   Albumin (g/dL)  Date Value  09/19/2021 3.1 (L)  04/15/2014 2.9 (L)   Phosphorus (mg/dL)  Date Value  09/26/2021 7.8 (H)   Sodium (mmol/L)  Date Value  09/26/2021 139  03/15/2020 145 (H)  04/15/2014 140   Assessment: 66 y/o male with h/o PUD, OSA, HTN, CKD III, substance abuse, CHF, Hep C and COPD and COVID 19 (02/2021) who is admitted with COPD exacerbation, CHF and AKI. Pharmacy is asked to follow and replace electrolytes while in CCU.  Worsening AKI (Scr 1.15 >> 2.2 >> 4.15) c/b hyperkalemia. Nephrology consulted, plan is to start HD 6/13  Goal of Therapy:  Electrolytes within normal limits  Plan:  --K 5.7, Lokelma 10 g per tube x 1 --Phos 7.8, defer management to PCCM --Follow-up electrolytes with AM labs tomorrow  Benita Gutter 09/26/2021 8:11 AM
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
## 2021-09-26 NOTE — Progress Notes (Signed)
PHARMACY NOTE:  ANTIMICROBIAL RENAL DOSAGE ADJUSTMENT  Current antimicrobial regimen includes a mismatch between antimicrobial dosage and estimated renal function.  As per policy approved by the Pharmacy & Therapeutics and Medical Executive Committees, the antimicrobial dosage will be adjusted accordingly.  Current antimicrobial dosage: Cefepime 2 g IV q8h  Indication: Pseudomonas pneumonia  Renal Function:  Estimated Creatinine Clearance: 31.8 mL/min (A) (by C-G formula based on SCr of 4.15 mg/dL (H)). '[x]'$      On intermittent HD, scheduled: first HD session planned for 6/13    Antimicrobial dosage has been changed to:  Cefepime 1 g IV q24h  Thank you for allowing pharmacy to be a part of this patient's care.  Benita Gutter, [facility] 09/26/2021 11:55 AM

## 2021-09-26 NOTE — Procedures (Signed)
Central Venous Catheter Insertion Procedure Note  Gary [name]  [id number]  07-17-1955  Date:09/26/21  Time:12:20 PM   Provider Performing:Manvi Guilliams Micheline Chapman   Procedure: Insertion of Non-tunneled Central Venous Catheter(36556)with US guidance (53299)    Indication(s) Hemodialysis  Consent Risks of the procedure as well as the alternatives and risks of each were explained to the patient and/or caregiver.  Consent for the procedure was obtained and is signed in the bedside chart  Anesthesia Topical only with 1% lidocaine   Timeout Verified patient identification, verified procedure, site/side was marked, verified correct patient position, special equipment/implants available, medications/allergies/relevant history reviewed, required imaging and test results available.  Sterile Technique Maximal sterile technique including full sterile barrier drape, hand hygiene, sterile gown, sterile gloves, mask, hair covering, sterile ultrasound probe cover (if used).  Procedure Description Area of catheter insertion was cleaned with chlorhexidine and draped in sterile fashion.   With real-time ultrasound guidance a HD catheter was placed into the right internal jugular vein.  Nonpulsatile blood flow and easy flushing noted in all ports.  The catheter was sutured in place and sterile dressing applied.  Complications/Tolerance None; patient tolerated the procedure well. Chest X-ray is ordered to verify placement for internal jugular or subclavian cannulation.  Chest x-ray is not ordered for femoral cannulation.  EBL Minimal  Specimen(s) None  [name], AGNP  Pulmonary/Critical Care Pager [phone] (please enter 7 digits) PCCM Consult Pager [phone] (please enter 7 digits)

## 2021-09-26 NOTE — Progress Notes (Signed)
[facility]  ROUNDING NOTE   Subjective:   Gary [name] is a 66 year old male with past medical conditions including COPD requiring 2 L nasal cannula, and congestive heart failure.  Patient presents to the emergency department with shortness of breath.  Patient has been admitted for COPD exacerbation ([place]) [J44.1] Obesity hypoventilation syndrome ([place]) [E66.2] Acute on chronic diastolic congestive heart failure ([name]) [I50.33] Acute respiratory failure with hypoxia and hypercapnia ([name]) [J96.01, J96.02] Acute on chronic respiratory failure with hypoxia and hypercapnia ([name]) [H37.16, J96.22]  Patient is known and has recently been followed by Dr. Holley Raring for cute kidney injury, resolved at his May appointment.  Patient is currently sedated and intubated.  History obtained through chart review.  No family at bedside.  Is stated the patient reported feeling bad for the past 5 days with progressive shortness of breath.  Endorses dry cough and lower extremity edema.  Prescribed CPAP at night, compliance unknown.  Patient did deny chest pain, fever, chills on admission.  Labs on admission include bicarb 39, BUN 25, creatinine 1.35 with GFR 59, albumin 3.1, calcium 8.3.  BNP 268.  UA culture negative, tox screen negative.  Chest x-ray on admission showed pulmonary edema with bilateral effusions.  Patient required intubation in ED due to declining respiratory status.   Objective:  Vital signs in last 24 hours:  Temp:  [97.7 F (36.5 C)-98.4 F (36.9 C)] 98.4 F (36.9 C) (06/13 0500) Pulse Rate:  [86-92] 92 (06/13 0500) Resp:  [14-28] 28 (06/13 0500) BP: (108-123)/(67-88) 113/77 (06/13 0500) SpO2:  [92 %-99 %] 97 % (06/13 0500) FiO2 (%):  [50 %] 50 % (06/13 1115) Weight:  [185.6 kg] 185.6 kg (06/13 0426)  Weight change: 0.7 kg Filed Weights   09/24/21 0500 09/25/21 0500 09/26/21 0426  Weight: (!) 173 kg (!) 184.9 kg (!) 185.6 kg    Intake/Output: I/O last 3 completed  shifts: In: 3075.7 [I.V.:905; Other:60; NG/GT:1825.8; IV Piggyback:285] Out: 140 [Urine:40; Stool:100]   Intake/Output this shift:  Total I/O In: 1023.9 [I.V.:43.9; NG/GT:780; IV Piggyback:200] Out: -   Physical Exam: General: Sedated  Head: Normocephalic, atraumatic.   Eyes: Anicteric  Lungs:  Rhonchi throughout, vent and intubated  Heart: Regular rate and rhythm  Abdomen:  Soft, nontender, obese  Extremities: 2+ peripheral edema.  Neurologic: Nonfocal, moving all four extremities  Skin: No lesions  Access: None    Basic Metabolic Panel: Recent Labs  Lab 09/22/21 0141 09/23/21 0704 09/23/21 1302 09/24/21 0441 09/25/21 0528 09/26/21 0638  NA 139 136  --  141 142 139  K 4.0 5.8* 4.0 4.0 4.5 5.7*  CL 103 103  --  108 109 105  CO2 25 23  --  28 24 21*  GLUCOSE 104* 103*  --  115* 115* 117*  BUN 74* 67*  --  61* 90* 163*  CREATININE 2.02* 1.41*  --  1.15 2.20* 4.15*  CALCIUM 7.4* 7.9*  --  8.1* 8.5* 8.1*  MG 2.4 2.6*  --  2.8* 2.6* 2.7*  PHOS 6.1* 4.1  --  2.8 4.3 7.8*    Liver Function Tests: No results for input(s): "AST", "ALT", "ALKPHOS", "BILITOT", "PROT", "ALBUMIN" in the last 168 hours. No results for input(s): "LIPASE", "AMYLASE" in the last 168 hours. No results for input(s): "AMMONIA" in the last 168 hours.  CBC: Recent Labs  Lab 09/22/21 0141 09/23/21 0704 09/24/21 0441 09/25/21 0528 09/26/21 0638  WBC 7.9 6.6 6.2 10.2 7.8  HGB 12.7* 12.8* 12.2* 12.9* 13.4  HCT 45.2 44.0 42.9 45.5 48.9  MCV 80.3 79.0* 79.2* 79.8* 81.8  PLT 146* 153 138* 166 117*    Cardiac Enzymes: No results for input(s): "CKTOTAL", "CKMB", "CKMBINDEX", "TROPONINI" in the last 168 hours.  BNP: Invalid input(s): "POCBNP"  CBG: Recent Labs  Lab 09/25/21 2327 09/26/21 0417 09/26/21 0440 09/26/21 0732 09/26/21 1119  GLUCAP 95 59* 100* 127* 134*    Microbiology: Results for orders placed or performed during the hospital encounter of 09/19/21  SARS Coronavirus 2 by  RT PCR (hospital order, performed in [facility] hospital lab) *cepheid single result test* Anterior Nasal Swab     Status: None   Collection Time: 09/19/21  9:57 AM   Specimen: Anterior Nasal Swab  Result Value Ref Range Status   SARS Coronavirus 2 by RT PCR NEGATIVE NEGATIVE Final    Comment: (NOTE) SARS-CoV-2 target nucleic acids are NOT DETECTED.  The SARS-CoV-2 RNA is generally detectable in upper and lower respiratory specimens during the acute phase of infection. The lowest concentration of SARS-CoV-2 viral copies this assay can detect is 250 copies / mL. A negative result does not preclude SARS-CoV-2 infection and should not be used as the sole basis for treatment or other patient management decisions.  A negative result may occur with improper specimen collection / handling, submission of specimen other than nasopharyngeal swab, presence of viral mutation(s) within the areas targeted by this assay, and inadequate number of viral copies (<250 copies / mL). A negative result must be combined with clinical observations, patient history, and epidemiological information.  Fact Sheet for Patients:   https://www.patel.info/  Fact Sheet for Healthcare Providers: https://hall.com/  This test is not yet approved or  cleared by the [place] FDA and has been authorized for detection and/or diagnosis of SARS-CoV-2 by FDA under an Emergency Use Authorization (EUA).  This EUA will remain in effect (meaning this test can be used) for the duration of the COVID-19 declaration under Section 564(b)(1) of the Act, 21 U.S.C. section 360bbb-3(b)(1), unless the authorization is terminated or revoked sooner.  Performed at [facility], [address] [place], [place] [address]   Culture, Respiratory w Gram Stain     Status: None   Collection Time: 09/19/21 11:55 AM   Specimen: SPU  Result Value Ref Range Status   Specimen Description    Final    SPUTUM Performed at [facility], [place] [address], [place], [place] [address]    Special Requests   Final    NONE Performed at [facility], [place], [place] [address]    Gram Stain   Final    FEW WBC PRESENT, PREDOMINANTLY PMN FEW GRAM POSITIVE COCCI IN PAIRS Performed at [facility], [place] [address], [place], [place] [address]    Culture RARE [name]  Final   Report Status 09/24/2021 FINAL  Final   Organism ID, Bacteria [name]  Final      Susceptibility   [name] - MIC*    CEFTAZIDIME 4 SENSITIVE Sensitive     CIPROFLOXACIN <=0.25 SENSITIVE Sensitive     GENTAMICIN 2 SENSITIVE Sensitive     IMIPENEM 1 SENSITIVE Sensitive     PIP/TAZO 8 SENSITIVE Sensitive     CEFEPIME 2 SENSITIVE Sensitive     * RARE [name]  MRSA Next Gen by PCR, Nasal     Status: None   Collection Time: 09/19/21  6:10 PM   Specimen: Nasal Mucosa; Nasal  Swab  Result Value Ref Range Status   MRSA by PCR Next Gen NOT DETECTED NOT DETECTED Final    Comment: (NOTE) The GeneXpert MRSA Assay (FDA approved for NASAL specimens only), is one component of a comprehensive MRSA colonization surveillance program. It is not intended to diagnose MRSA infection nor to guide or monitor treatment for MRSA infections. Test performance is not FDA approved in patients less than 39 years old. Performed at [facility], [address] [place], [place] [address]   Gastrointestinal Panel by PCR , Stool     Status: Abnormal   Collection Time: 09/21/21 10:53 AM   Specimen: Stool  Result Value Ref Range Status   Campylobacter species NOT DETECTED NOT DETECTED Final   Plesimonas shigelloides NOT DETECTED NOT DETECTED Final   Salmonella species NOT DETECTED NOT DETECTED Final   Yersinia enterocolitica DETECTED (A) NOT DETECTED Final    Comment: RESULT CALLED TO, READ BACK BY AND VERIFIED WITH: [facility] JACKSON AT  1126 09/22/21.PMF    Vibrio species NOT DETECTED NOT DETECTED Final   Vibrio cholerae NOT DETECTED NOT DETECTED Final   Enteroaggregative E coli (EAEC) NOT DETECTED NOT DETECTED Final   Enteropathogenic E coli (EPEC) NOT DETECTED NOT DETECTED Final   Enterotoxigenic E coli (ETEC) NOT DETECTED NOT DETECTED Final   Shiga like toxin producing E coli (STEC) NOT DETECTED NOT DETECTED Final   Shigella/Enteroinvasive E coli (EIEC) NOT DETECTED NOT DETECTED Final   Cryptosporidium NOT DETECTED NOT DETECTED Final   Cyclospora cayetanensis NOT DETECTED NOT DETECTED Final   Entamoeba histolytica NOT DETECTED NOT DETECTED Final   Giardia lamblia NOT DETECTED NOT DETECTED Final   Adenovirus F40/41 NOT DETECTED NOT DETECTED Final   Astrovirus NOT DETECTED NOT DETECTED Final   Norovirus GI/GII NOT DETECTED NOT DETECTED Final   Rotavirus A NOT DETECTED NOT DETECTED Final   Sapovirus (I, II, IV, and V) NOT DETECTED NOT DETECTED Final    Comment: Performed at [facility], [address] [place], [place] [address]  C Difficile Quick Screen w PCR reflex     Status: None   Collection Time: 09/21/21 11:30 AM   Specimen: STOOL  Result Value Ref Range Status   C Diff antigen NEGATIVE NEGATIVE Final   C Diff toxin NEGATIVE NEGATIVE Final   C Diff interpretation No C. difficile detected.  Final    Comment: Performed at [facility], [address] [place], [place] [address]  Culture, blood (Routine X 2) w Reflex to ID Panel     Status: None   Collection Time: 09/21/21 12:28 PM   Specimen: BLOOD  Result Value Ref Range Status   Specimen Description BLOOD BLOOD RIGHT HAND  Final   Special Requests   Final    BOTTLES DRAWN AEROBIC AND ANAEROBIC Blood Culture adequate volume   Culture   Final    NO GROWTH 5 DAYS Performed at [facility], [address] [place], [place] [address]    Report Status 09/26/2021 FINAL  Final  Culture, blood (Routine X 2) w Reflex to ID Panel      Status: None   Collection Time: 09/21/21  2:08 PM   Specimen: BLOOD  Result Value Ref Range Status   Specimen Description BLOOD BLOOD LEFT HAND  Final   Special Requests   Final    BOTTLES DRAWN AEROBIC AND ANAEROBIC Blood Culture adequate volume   Culture   Final    NO GROWTH 5 DAYS Performed at [facility]  [facility], [address] [place], [place] [address]    Report Status 09/26/2021 FINAL  Final  Respiratory (~20 pathogens) panel by PCR     Status: None   Collection Time: 09/21/21  6:14 PM   Specimen: Nasopharyngeal Swab; Respiratory  Result Value Ref Range Status   Adenovirus NOT DETECTED NOT DETECTED Final   Coronavirus 229E NOT DETECTED NOT DETECTED Final    Comment: (NOTE) The Coronavirus on the Respiratory Panel, DOES NOT test for the novel  Coronavirus (2019 nCoV)    Coronavirus HKU1 NOT DETECTED NOT DETECTED Final   Coronavirus NL63 NOT DETECTED NOT DETECTED Final   Coronavirus OC43 NOT DETECTED NOT DETECTED Final   Metapneumovirus NOT DETECTED NOT DETECTED Final   Rhinovirus / Enterovirus NOT DETECTED NOT DETECTED Final   Influenza A NOT DETECTED NOT DETECTED Final   Influenza B NOT DETECTED NOT DETECTED Final   Parainfluenza Virus 1 NOT DETECTED NOT DETECTED Final   Parainfluenza Virus 2 NOT DETECTED NOT DETECTED Final   Parainfluenza Virus 3 NOT DETECTED NOT DETECTED Final   Parainfluenza Virus 4 NOT DETECTED NOT DETECTED Final   Respiratory Syncytial Virus NOT DETECTED NOT DETECTED Final   Bordetella pertussis NOT DETECTED NOT DETECTED Final   Bordetella Parapertussis NOT DETECTED NOT DETECTED Final   Chlamydophila pneumoniae NOT DETECTED NOT DETECTED Final   Mycoplasma pneumoniae NOT DETECTED NOT DETECTED Final    Comment: Performed at [facility], [place] [address], [place], [place] [address]    Coagulation Studies: No results for input(s): "LABPROT", "INR" in the last 72 hours.  Urinalysis: No results for input(s): "COLORURINE", "LABSPEC",  "PHURINE", "GLUCOSEU", "HGBUR", "BILIRUBINUR", "KETONESUR", "PROTEINUR", "UROBILINOGEN", "NITRITE", "LEUKOCYTESUR" in the last 72 hours.  Invalid input(s): "APPERANCEUR"    Imaging: DG Chest Port 1 View  Result Date: 09/26/2021 CLINICAL DATA:  Central line placement. EXAM: PORTABLE CHEST 1 VIEW COMPARISON:  Radiographs 09/26/2021 and 09/23/2021.  CT 07/20/2021. FINDINGS: 1230 hours. Tip of the endotracheal tube is now well proximally 1.7 cm above the carina. Enteric tube projects below the diaphragm, tip not visualized. There is a new right IJ central venous catheter which projects to the level of the lower SVC. Persistent low lung volumes with bibasilar atelectasis and probable mild edema. No pneumothorax or significant pleural effusion. Stable cardiomegaly and aortic atherosclerosis. No acute osseous findings are evident. Telemetry leads overlie the chest. IMPRESSION: 1. New right IJ central venous catheter projects to the lower SVC level. No evidence of pneumothorax. 2. Endotracheal tube remains low, proximally 1.7 cm above the carina. 3. No change in bilateral airspace opacities. Electronically Signed   By: Richardean Sale M.D.   On: 09/26/2021 12:41   US RENAL  Result Date: 09/26/2021 CLINICAL DATA:  Acute on chronic renal failure EXAM: RENAL / URINARY TRACT ULTRASOUND COMPLETE COMPARISON:  None Available. FINDINGS: Evaluation is somewhat limited by body habitus. Right Kidney: Renal measurements: 13.8 x 6.8 x 6.0 cm = volume: 294 mL. Echogenicity within normal limits. No mass visualized. Mild hydronephrosis. Left Kidney: Renal measurements: 12.6 x 6.4 x 5.8 cm = volume: 244 mL. Echogenicity within normal limits. No hydronephrosis visualized. In the superior left kidney, there is a 2.4 x 2.7 x 2.8 cm anechoic lesion, consistent with a simple cyst. Bladder: Appears normal for degree of bladder distention. No Foley is seen within the bladder. Bladder volume: 1389 mL. Other: None. IMPRESSION: 1. Mild  right hydronephrosis. 2. Simple cyst in the superior left kidney. Electronically Signed   By: Bryson Ha  Vasan M.D.   On: 09/26/2021 11:58   DG Chest Port 1 View  Result Date: 09/26/2021 CLINICAL DATA:  Ventilator dependent respiratory failure with hypoxia. EXAM: PORTABLE CHEST 1 VIEW COMPARISON:  Portable chest 09/23/2021. FINDINGS: 3:09 a.m., 09/26/2021. ETT has been advanced to within 5 mm of the carina and should be withdrawn 3 cm to a mid tracheal positioning. Enteric tube is well inside the stomach with tip likely terminating in the duodenal bulb based on the positioning of the proximal side-hole, with the tip not filmed. The heart is enlarged. Central vascular distension continues to be seen. There is increased lower zonal interstitial edema today but there are also decreased lung volumes which may exaggerate the interstitium. Small pleural effusions appear similar. There is increased atelectasis versus consolidation in the hypoinflated bases. There is a stable mediastinal configuration with aortic ectasia and tortuosity with atherosclerosis in the arch. IMPRESSION: 1. ETT tip is advanced to within 5 mm of the carina and should be withdrawn 3 cm to a mid tracheal position. 2. Increased interstitial edema versus exaggerated interstitium due to low inspiration. 3. Small pleural effusions with increased atelectasis or consolidation in the hypoinflated bases. Electronically Signed   By: Telford Nab M.D.   On: 09/26/2021 06:06     Medications:    sodium chloride 10 mL/hr at 09/26/21 0900   ceFEPime (MAXIPIME) IV     feeding supplement (VITAL 1.5 CAL) 60 mL/hr at 09/26/21 0900   fentaNYL infusion INTRAVENOUS 25 mcg/hr (09/26/21 0900)    budesonide (PULMICORT) nebulizer solution  0.5 mg Nebulization BID   chlorhexidine gluconate (MEDLINE KIT)  15 mL Mouth Rinse BID   Chlorhexidine Gluconate Cloth  6 each Topical Daily   diazepam  5 mg Per Tube Q8H   feeding supplement (PROSource TF)  90 mL Per Tube  5 X Daily   fiber  1 packet Per Tube BID   free water  50 mL Per Tube Q4H   heparin injection (subcutaneous)  5,000 Units Subcutaneous Q8H   heparin sodium (porcine)       insulin aspart  0-15 Units Subcutaneous Q4H   ipratropium-albuterol  3 mL Nebulization Q6H   mouth rinse  15 mL Mouth Rinse 10 times per day   multivitamin  1 tablet Per Tube QHS   oxyCODONE  10 mg Per Tube Q8H   pantoprazole sodium  40 mg Per Tube Daily   polyethylene glycol  17 g Per Tube Daily   QUEtiapine  50 mg Per Tube QHS   acetaminophen, acetaminophen, albuterol, fentaNYL, guaiFENesin-dextromethorphan, heparin sodium (porcine), hydrALAZINE, midazolam  Assessment/ Plan:  Gary [name] is a 66 y.o.  male with past medical conditions including COPD requiring 2 L nasal cannula, and congestive heart failure.  Patient presents to the emergency department with shortness of breath.  Patient has been admitted for COPD exacerbation ([place]) [J44.1] Obesity hypoventilation syndrome ([place]) [E66.2] Acute on chronic diastolic congestive heart failure ([name]) [I50.33] Acute respiratory failure with hypoxia and hypercapnia ([name]) [J96.01, J96.02] Acute on chronic respiratory failure with hypoxia and hypercapnia ([name]) [L37.34, J96.22]    Acute Kidney Injury with hyperkalemia, cause unidentified.  No IV contrast exposure and brief hypotensive event. Normal renal function 2 days prior.Renal Ultrasound shows mild right obstruction and simple cyst in  left kidney.  Due to progressive renal function, elevated BUN 163 and patient now oliguric. We will initiate renal replacement therapy. Appreciate critical care team placing HD access. Will perform 2 hour, slow treatment today and  provide additional treatment tomorrow. Potassium with correct with dialysis. Will monitor for renal recovery.   Lab Results  Component Value Date   CREATININE 4.15 (H) 09/26/2021   CREATININE 2.20 (H) 09/25/2021   CREATININE 1.15 09/24/2021     Intake/Output Summary (Last 24 hours) at 09/26/2021 1419 Last data filed at 09/26/2021 0900 Gross per 24 hour  Intake 1651.66 ml  Output 135 ml  Net 1516.66 ml   2. Acute metabolic acidosis. Serum bicarb 21. Will correct with dialysis.   3.  Chronic diastolic heart failure.  Echo from 08/07/2021 shows EF 60 to 65% with a mildly dilated left ventricular cavity and grade 1 diastolic dysfunction.    LOS: 7 [place] 6/13/20232:19 PM

## 2021-09-26 NOTE — Progress Notes (Signed)
Hemodialysis Post Treatment Note:  Tx date:09/26/2021 Tx time: 2 hours Access: right CVC UF Removed: no fluid removal as orderede  Note: HD  treatment completed. Tolerated well. No HD complications.

## 2021-09-27 DIAGNOSIS — J9622 Acute and chronic respiratory failure with hypercapnia: Secondary | ICD-10-CM | POA: Diagnosis not present

## 2021-09-27 DIAGNOSIS — J9621 Acute and chronic respiratory failure with hypoxia: Secondary | ICD-10-CM | POA: Diagnosis not present

## 2021-09-27 DIAGNOSIS — L8990 Pressure ulcer of unspecified site, unspecified stage: Secondary | ICD-10-CM | POA: Insufficient documentation

## 2021-09-27 LAB — BASIC METABOLIC PANEL
Anion gap: 13 (ref 5–15)
Anion gap: 12 (ref 5–15)
Anion gap: 12 (ref 5–15)
BUN: 119 mg/dL — ABNORMAL HIGH (ref 8–23)
BUN: 134 mg/dL — ABNORMAL HIGH (ref 8–23)
BUN: 131 mg/dL — ABNORMAL HIGH (ref 8–23)
CO2: 22 mmol/L (ref 22–32)
CO2: 24 mmol/L (ref 22–32)
CO2: 23 mmol/L (ref 22–32)
Calcium: 7.9 mg/dL — ABNORMAL LOW (ref 8.9–10.3)
Calcium: 7.7 mg/dL — ABNORMAL LOW (ref 8.9–10.3)
Calcium: 7.8 mg/dL — ABNORMAL LOW (ref 8.9–10.3)
Chloride: 103 mmol/L (ref 98–111)
Chloride: 100 mmol/L (ref 98–111)
Chloride: 104 mmol/L (ref 98–111)
Creatinine, Ser: 4.72 mg/dL — ABNORMAL HIGH (ref 0.61–1.24)
Creatinine, Ser: 5.37 mg/dL — ABNORMAL HIGH (ref 0.61–1.24)
Creatinine, Ser: 5.24 mg/dL — ABNORMAL HIGH (ref 0.61–1.24)
GFR, Estimated: 13 mL/min — ABNORMAL LOW (ref >60)
GFR, Estimated: 11 mL/min — ABNORMAL LOW (ref >60)
GFR, Estimated: 12 mL/min — ABNORMAL LOW (ref >60)
Glucose, Bld: 114 mg/dL — ABNORMAL HIGH (ref 70–99)
Glucose, Bld: 123 mg/dL — ABNORMAL HIGH (ref 70–99)
Glucose, Bld: 139 mg/dL — ABNORMAL HIGH (ref 70–99)
Potassium: 5.7 mmol/L — ABNORMAL HIGH (ref 3.5–5.1)
Potassium: 5.4 mmol/L — ABNORMAL HIGH (ref 3.5–5.1)
Potassium: 5.4 mmol/L — ABNORMAL HIGH (ref 3.5–5.1)
Sodium: 138 mmol/L (ref 135–145)
Sodium: 136 mmol/L (ref 135–145)
Sodium: 139 mmol/L (ref 135–145)

## 2021-09-27 LAB — CBC
HCT: 43.7 % (ref 39.0–52.0)
Hemoglobin: 12.2 g/dL — ABNORMAL LOW (ref 13.0–17.0)
MCH: 22.7 pg — ABNORMAL LOW (ref 26.0–34.0)
MCHC: 27.9 g/dL — ABNORMAL LOW (ref 30.0–36.0)
MCV: 81.4 fL (ref 80.0–100.0)
Platelets: 156 10*3/uL (ref 150–400)
RBC: 5.37 MIL/uL (ref 4.22–5.81)
RDW: 17.9 % — ABNORMAL HIGH (ref 11.5–15.5)
WBC: 8.4 10*3/uL (ref 4.0–10.5)
nRBC: 0.4 % — ABNORMAL HIGH (ref 0.0–0.2)

## 2021-09-27 LAB — GLUCOSE, CAPILLARY
Glucose-Capillary: 130 mg/dL — ABNORMAL HIGH (ref 70–99)
Glucose-Capillary: 131 mg/dL — ABNORMAL HIGH (ref 70–99)
Glucose-Capillary: 138 mg/dL — ABNORMAL HIGH (ref 70–99)
Glucose-Capillary: 138 mg/dL — ABNORMAL HIGH (ref 70–99)
Glucose-Capillary: 145 mg/dL — ABNORMAL HIGH (ref 70–99)
Glucose-Capillary: 200 mg/dL — ABNORMAL HIGH (ref 70–99)

## 2021-09-27 LAB — PHOSPHORUS: Phosphorus: 6.5 mg/dL — ABNORMAL HIGH (ref 2.5–4.6)

## 2021-09-27 LAB — APTT: aPTT: 38 seconds — ABNORMAL HIGH (ref 24–36)

## 2021-09-27 LAB — PROTIME-INR
INR: 1.2 (ref 0.8–1.2)
Prothrombin Time: 14.8 seconds (ref 11.4–15.2)

## 2021-09-27 LAB — MAGNESIUM: Magnesium: 2.3 mg/dL (ref 1.7–2.4)

## 2021-09-27 LAB — HEPATITIS B SURFACE ANTIBODY, QUANTITATIVE: Hep B S AB Quant (Post): <3.1 m[IU]/mL — ABNORMAL LOW (ref >9.9)

## 2021-09-27 MED ORDER — HEPARIN SODIUM (PORCINE) 1000 UNIT/ML IJ SOLN
INTRAMUSCULAR | Status: AC
  Filled 2021-09-27: qty 10

## 2021-09-27 MED ORDER — ORAL CARE MOUTH RINSE
15.0000 mL | OROMUCOSAL | Status: DC
  Administered 2021-09-27 – 2021-09-30 (×40): 15 mL via OROMUCOSAL

## 2021-09-27 MED ORDER — INSULIN ASPART 100 UNIT/ML IV SOLN
10.0000 [IU] | Freq: Once | INTRAVENOUS | Status: AC
  Administered 2021-09-27: 10 [IU] via INTRAVENOUS
  Filled 2021-09-27: qty 0.1

## 2021-09-27 MED ORDER — DEXTROSE 50 % IV SOLN
1.0000 | Freq: Once | INTRAVENOUS | Status: AC
  Administered 2021-09-27: 50 mL via INTRAVENOUS
  Filled 2021-09-27: qty 50

## 2021-09-27 MED ORDER — ACETAMINOPHEN 325 MG PO TABS
650.0000 mg | ORAL_TABLET | Freq: Four times a day (QID) | ORAL | Status: DC | PRN
  Administered 2021-10-18 – 2021-11-21 (×22): 650 mg
  Filled 2021-09-27 (×23): qty 2

## 2021-09-27 MED ORDER — SODIUM ZIRCONIUM CYCLOSILICATE 5 G PO PACK
10.0000 g | PACK | Freq: Every day | ORAL | Status: DC
  Administered 2021-09-27 – 2021-09-28 (×2): 10 g
  Filled 2021-09-27 (×2): qty 2

## 2021-09-27 MED ORDER — ORAL CARE MOUTH RINSE
15.0000 mL | OROMUCOSAL | Status: DC | PRN
  Administered 2021-10-01 (×2): 15 mL via OROMUCOSAL

## 2021-09-27 NOTE — Progress Notes (Signed)
[place] for Electrolyte Monitoring and Replacement   Recent Labs: Potassium (mmol/L)  Date Value  09/27/2021 5.7 (H)  04/15/2014 3.9   Magnesium (mg/dL)  Date Value  09/27/2021 2.3  08/21/2012 1.9   Calcium (mg/dL)  Date Value  09/27/2021 7.9 (L)   Calcium, Total (mg/dL)  Date Value  04/15/2014 8.3 (L)   Albumin (g/dL)  Date Value  09/19/2021 3.1 (L)  04/15/2014 2.9 (L)   Phosphorus (mg/dL)  Date Value  09/27/2021 6.5 (H)   Sodium (mmol/L)  Date Value  09/27/2021 138  03/15/2020 145 (H)  04/15/2014 140   Assessment: 66 y/o male with h/o PUD, OSA, HTN, CKD III, substance abuse, CHF, Hep C and COPD and COVID 19 (02/2021) who is admitted with COPD exacerbation, CHF and AKI. Pharmacy is asked to follow and replace electrolytes while in CCU.  Acute renal failure; nephrology consulted. First HD 6/13  Goal of Therapy:  Electrolytes within normal limits  Plan:  --K 5.7, Lokelma 10 g per tube daily scheduled for now --Phos 6.5, defer management to [facility] / nephrology --Follow-up electrolytes with AM labs tomorrow  Benita Gutter 09/27/2021 7:54 AM
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
## 2021-09-27 NOTE — Progress Notes (Signed)
Foley exchange ordered per NP Nelson per renal ultrasound showing no foley in bladder and 1389 mL of urine present in patient's bladder.   When foley removed, patient spontaneously voided.   1525 mL of brown cloudy urine drained when new foley placed.

## 2021-09-27 NOTE — Progress Notes (Signed)
[facility]  ROUNDING NOTE   Subjective:   Gary [name] is a 66 year old male with past medical conditions including COPD requiring 2 L nasal cannula, and congestive heart failure.  Patient presents to the emergency department with shortness of breath.  Patient has been admitted for COPD exacerbation ([place]) [J44.1] Obesity hypoventilation syndrome ([place]) [E66.2] Acute on chronic diastolic congestive heart failure ([name]) [I50.33] Acute respiratory failure with hypoxia and hypercapnia ([name]) [J96.01, J96.02] Acute on chronic respiratory failure with hypoxia and hypercapnia ([name]) [I45.80, J96.22]  Patient is known and has recently been followed by Dr. Holley Raring for cute kidney injury, resolved at his May appointment.    Patient seen and evaluated at bedside in ICU Critically ill-appearing Sedation none eyes raised when name called however did not open Continues to receive tube feeds at 60 mL/h No pressors Intubated with vent settings 50% FiO2 and a PEEP Catheter with no urine output Rectal tube in place Moderate lower extremity edema  Creatinine 4.72 BUN 119 Potassium 5.7  Objective:  Vital signs in last 24 hours:  Temp:  [98.6 F (37 C)-100 F (37.8 C)] 99.5 F (37.5 C) (06/14 0900) Pulse Rate:  [96-100] 96 (06/14 0900) Resp:  [16-28] 17 (06/14 0900) BP: (95-121)/(60-80) 117/74 (06/14 0900) SpO2:  [87 %-98 %] 97 % (06/14 0900) FiO2 (%):  [50 %] 50 % (06/14 0809) Weight:  [188.3 kg-188.4 kg] 188.4 kg (06/14 0500)  Weight change: 2.8 kg Filed Weights   09/26/21 1624 09/26/21 1915 09/27/21 0500  Weight: (!) 188.4 kg (!) 188.3 kg (!) 188.4 kg    Intake/Output: I/O last 3 completed shifts: In: 2878.1 [I.V.:157.1; NG/GT:2421; IV Piggyback:300] Out: 25 [Urine:25]   Intake/Output this shift:  Total I/O In: 372.1 [I.V.:12.1; NG/GT:360] Out: 0   Physical Exam: General: Critically ill-appearing  Head: Normocephalic, atraumatic.   Eyes: Anicteric  Lungs:   Rhonchi throughout, vent and intubated  Heart: Regular rate and rhythm  Abdomen:  Soft, nontender, obese  Extremities: 2+ peripheral edema.  Neurologic: Nonfocal, moving all four extremities  Skin: No lesions  Access: Right IJ temp cath    Basic Metabolic Panel: Recent Labs  Lab 09/23/21 0704 09/23/21 1302 09/24/21 0441 09/25/21 0528 09/26/21 0638 09/27/21 0325  NA 136  --  141 142 139 138  K 5.8* 4.0 4.0 4.5 5.7* 5.7*  CL 103  --  108 109 105 103  CO2 23  --  28 24 21* 22  GLUCOSE 103*  --  115* 115* 117* 114*  BUN 67*  --  61* 90* 163* 119*  CREATININE 1.41*  --  1.15 2.20* 4.15* 4.72*  CALCIUM 7.9*  --  8.1* 8.5* 8.1* 7.9*  MG 2.6*  --  2.8* 2.6* 2.7* 2.3  PHOS 4.1  --  2.8 4.3 7.8* 6.5*     Liver Function Tests: No results for input(s): "AST", "ALT", "ALKPHOS", "BILITOT", "PROT", "ALBUMIN" in the last 168 hours. No results for input(s): "LIPASE", "AMYLASE" in the last 168 hours. No results for input(s): "AMMONIA" in the last 168 hours.  CBC: Recent Labs  Lab 09/23/21 0704 09/24/21 0441 09/25/21 0528 09/26/21 0638 09/27/21 0325  WBC 6.6 6.2 10.2 7.8 8.4  HGB 12.8* 12.2* 12.9* 13.4 12.2*  HCT 44.0 42.9 45.5 48.9 43.7  MCV 79.0* 79.2* 79.8* 81.8 81.4  PLT 153 138* 166 117* 156     Cardiac Enzymes: No results for input(s): "CKTOTAL", "CKMB", "CKMBINDEX", "TROPONINI" in the last 168 hours.  BNP: Invalid input(s): "POCBNP"  CBG:  Recent Labs  Lab 09/26/21 1629 09/26/21 1931 09/26/21 2355 09/27/21 0337 09/27/21 0738  GLUCAP 146* 147* 127* 138* 138*     Microbiology: Results for orders placed or performed during the hospital encounter of 09/19/21  SARS Coronavirus 2 by RT PCR (hospital order, performed in [facility] hospital lab) *cepheid single result test* Anterior Nasal Swab     Status: None   Collection Time: 09/19/21  9:57 AM   Specimen: Anterior Nasal Swab  Result Value Ref Range Status   SARS Coronavirus 2 by RT PCR NEGATIVE NEGATIVE  Final    Comment: (NOTE) SARS-CoV-2 target nucleic acids are NOT DETECTED.  The SARS-CoV-2 RNA is generally detectable in upper and lower respiratory specimens during the acute phase of infection. The lowest concentration of SARS-CoV-2 viral copies this assay can detect is 250 copies / mL. A negative result does not preclude SARS-CoV-2 infection and should not be used as the sole basis for treatment or other patient management decisions.  A negative result may occur with improper specimen collection / handling, submission of specimen other than nasopharyngeal swab, presence of viral mutation(s) within the areas targeted by this assay, and inadequate number of viral copies (<250 copies / mL). A negative result must be combined with clinical observations, patient history, and epidemiological information.  Fact Sheet for Patients:   https://www.patel.info/  Fact Sheet for Healthcare Providers: https://hall.com/  This test is not yet approved or  cleared by the [place] FDA and has been authorized for detection and/or diagnosis of SARS-CoV-2 by FDA under an Emergency Use Authorization (EUA).  This EUA will remain in effect (meaning this test can be used) for the duration of the COVID-19 declaration under Section 564(b)(1) of the Act, 21 U.S.C. section 360bbb-3(b)(1), unless the authorization is terminated or revoked sooner.  Performed at [facility], [address] [place], [place] [address]   Culture, Respiratory w Gram Stain     Status: None   Collection Time: 09/19/21 11:55 AM   Specimen: SPU  Result Value Ref Range Status   Specimen Description   Final    SPUTUM Performed at [facility], [place] [address] [place], [place] [address]    Special Requests   Final    NONE Performed at [facility], [place], [place] [address]    Gram Stain   Final    FEW WBC PRESENT, PREDOMINANTLY  PMN FEW GRAM POSITIVE COCCI IN PAIRS Performed at [facility], [place] [address], [place], [place] [address]    Culture RARE [name]  Final   Report Status 09/24/2021 FINAL  Final   Organism ID, Bacteria [name]  Final      Susceptibility   [name] - MIC*    CEFTAZIDIME 4 SENSITIVE Sensitive     CIPROFLOXACIN <=0.25 SENSITIVE Sensitive     GENTAMICIN 2 SENSITIVE Sensitive     IMIPENEM 1 SENSITIVE Sensitive     PIP/TAZO 8 SENSITIVE Sensitive     CEFEPIME 2 SENSITIVE Sensitive     * RARE [name]  MRSA Next Gen by PCR, Nasal     Status: None   Collection Time: 09/19/21  6:10 PM   Specimen: Nasal Mucosa; Nasal Swab  Result Value Ref Range Status   MRSA by PCR Next Gen NOT DETECTED NOT DETECTED Final    Comment: (NOTE) The GeneXpert MRSA Assay (FDA approved for NASAL specimens only), is one component of a comprehensive MRSA colonization surveillance program.  It is not intended to diagnose MRSA infection nor to guide or monitor treatment for MRSA infections. Test performance is not FDA approved in patients less than 107 years old. Performed at [facility], [address] [place], [place] [address]   Gastrointestinal Panel by PCR , Stool     Status: Abnormal   Collection Time: 09/21/21 10:53 AM   Specimen: Stool  Result Value Ref Range Status   Campylobacter species NOT DETECTED NOT DETECTED Final   Plesimonas shigelloides NOT DETECTED NOT DETECTED Final   Salmonella species NOT DETECTED NOT DETECTED Final   Yersinia enterocolitica DETECTED (A) NOT DETECTED Final    Comment: RESULT CALLED TO, READ BACK BY AND VERIFIED WITH: [facility] JACKSON AT 1126 09/22/21.PMF    Vibrio species NOT DETECTED NOT DETECTED Final   Vibrio cholerae NOT DETECTED NOT DETECTED Final   Enteroaggregative E coli (EAEC) NOT DETECTED NOT DETECTED Final   Enteropathogenic E coli (EPEC) NOT DETECTED NOT DETECTED Final   Enterotoxigenic E  coli (ETEC) NOT DETECTED NOT DETECTED Final   Shiga like toxin producing E coli (STEC) NOT DETECTED NOT DETECTED Final   Shigella/Enteroinvasive E coli (EIEC) NOT DETECTED NOT DETECTED Final   Cryptosporidium NOT DETECTED NOT DETECTED Final   Cyclospora cayetanensis NOT DETECTED NOT DETECTED Final   Entamoeba histolytica NOT DETECTED NOT DETECTED Final   Giardia lamblia NOT DETECTED NOT DETECTED Final   Adenovirus F40/41 NOT DETECTED NOT DETECTED Final   Astrovirus NOT DETECTED NOT DETECTED Final   Norovirus GI/GII NOT DETECTED NOT DETECTED Final   Rotavirus A NOT DETECTED NOT DETECTED Final   Sapovirus (I, II, IV, and V) NOT DETECTED NOT DETECTED Final    Comment: Performed at [facility], [address] [place], [place] [address]  C Difficile Quick Screen w PCR reflex     Status: None   Collection Time: 09/21/21 11:30 AM   Specimen: STOOL  Result Value Ref Range Status   C Diff antigen NEGATIVE NEGATIVE Final   C Diff toxin NEGATIVE NEGATIVE Final   C Diff interpretation No C. difficile detected.  Final    Comment: Performed at [facility], [address] [place], [place] [address]  Culture, blood (Routine X 2) w Reflex to ID Panel     Status: None   Collection Time: 09/21/21 12:28 PM   Specimen: BLOOD  Result Value Ref Range Status   Specimen Description BLOOD BLOOD RIGHT HAND  Final   Special Requests   Final    BOTTLES DRAWN AEROBIC AND ANAEROBIC Blood Culture adequate volume   Culture   Final    NO GROWTH 5 DAYS Performed at [facility], [address], [place], [place] [address]    Report Status 09/26/2021 FINAL  Final  Culture, blood (Routine X 2) w Reflex to ID Panel     Status: None   Collection Time: 09/21/21  2:08 PM   Specimen: BLOOD  Result Value Ref Range Status   Specimen Description BLOOD BLOOD LEFT HAND  Final   Special Requests   Final    BOTTLES DRAWN AEROBIC AND ANAEROBIC Blood Culture adequate volume   Culture   Final     NO GROWTH 5 DAYS Performed at [facility], [address], [place], [place] [address]    Report Status 09/26/2021 FINAL  Final  Respiratory (~20 pathogens) panel by PCR     Status: None   Collection Time: 09/21/21  6:14 PM   Specimen: Nasopharyngeal Swab;  Respiratory  Result Value Ref Range Status   Adenovirus NOT DETECTED NOT DETECTED Final   Coronavirus 229E NOT DETECTED NOT DETECTED Final    Comment: (NOTE) The Coronavirus on the Respiratory Panel, DOES NOT test for the novel  Coronavirus (2019 nCoV)    Coronavirus HKU1 NOT DETECTED NOT DETECTED Final   Coronavirus NL63 NOT DETECTED NOT DETECTED Final   Coronavirus OC43 NOT DETECTED NOT DETECTED Final   Metapneumovirus NOT DETECTED NOT DETECTED Final   Rhinovirus / Enterovirus NOT DETECTED NOT DETECTED Final   Influenza A NOT DETECTED NOT DETECTED Final   Influenza B NOT DETECTED NOT DETECTED Final   Parainfluenza Virus 1 NOT DETECTED NOT DETECTED Final   Parainfluenza Virus 2 NOT DETECTED NOT DETECTED Final   Parainfluenza Virus 3 NOT DETECTED NOT DETECTED Final   Parainfluenza Virus 4 NOT DETECTED NOT DETECTED Final   Respiratory Syncytial Virus NOT DETECTED NOT DETECTED Final   Bordetella pertussis NOT DETECTED NOT DETECTED Final   Bordetella Parapertussis NOT DETECTED NOT DETECTED Final   Chlamydophila pneumoniae NOT DETECTED NOT DETECTED Final   Mycoplasma pneumoniae NOT DETECTED NOT DETECTED Final    Comment: Performed at [facility], [place]. [address] [place], [place] [address]    Coagulation Studies: Recent Labs    09/27/21 1016  LABPROT 14.8  INR 1.2    Urinalysis: No results for input(s): "COLORURINE", "LABSPEC", "PHURINE", "GLUCOSEU", "HGBUR", "BILIRUBINUR", "KETONESUR", "PROTEINUR", "UROBILINOGEN", "NITRITE", "LEUKOCYTESUR" in the last 72 hours.  Invalid input(s): "APPERANCEUR"    Imaging: DG Chest Port 1 View  Result Date: 09/26/2021 CLINICAL DATA:  Central line placement. EXAM:  PORTABLE CHEST 1 VIEW COMPARISON:  Radiographs 09/26/2021 and 09/23/2021.  CT 07/20/2021. FINDINGS: 1230 hours. Tip of the endotracheal tube is now well proximally 1.7 cm above the carina. Enteric tube projects below the diaphragm, tip not visualized. There is a new right IJ central venous catheter which projects to the level of the lower SVC. Persistent low lung volumes with bibasilar atelectasis and probable mild edema. No pneumothorax or significant pleural effusion. Stable cardiomegaly and aortic atherosclerosis. No acute osseous findings are evident. Telemetry leads overlie the chest. IMPRESSION: 1. New right IJ central venous catheter projects to the lower SVC level. No evidence of pneumothorax. 2. Endotracheal tube remains low, proximally 1.7 cm above the carina. 3. No change in bilateral airspace opacities. Electronically Signed   By: Richardean Sale M.D.   On: 09/26/2021 12:41   US RENAL  Result Date: 09/26/2021 CLINICAL DATA:  Acute on chronic renal failure EXAM: RENAL / URINARY TRACT ULTRASOUND COMPLETE COMPARISON:  None Available. FINDINGS: Evaluation is somewhat limited by body habitus. Right Kidney: Renal measurements: 13.8 x 6.8 x 6.0 cm = volume: 294 mL. Echogenicity within normal limits. No mass visualized. Mild hydronephrosis. Left Kidney: Renal measurements: 12.6 x 6.4 x 5.8 cm = volume: 244 mL. Echogenicity within normal limits. No hydronephrosis visualized. In the superior left kidney, there is a 2.4 x 2.7 x 2.8 cm anechoic lesion, consistent with a simple cyst. Bladder: Appears normal for degree of bladder distention. No Foley is seen within the bladder. Bladder volume: 1389 mL. Other: None. IMPRESSION: 1. Mild right hydronephrosis. 2. Simple cyst in the superior left kidney. Electronically Signed   By: Merilyn Baba M.D.   On: 09/26/2021 11:58   DG Chest Port 1 View  Result Date: 09/26/2021 CLINICAL DATA:  Ventilator dependent respiratory failure with hypoxia. EXAM: PORTABLE CHEST 1  VIEW COMPARISON:  Portable chest 09/23/2021. FINDINGS: 3:09  a.m., 09/26/2021. ETT has been advanced to within 5 mm of the carina and should be withdrawn 3 cm to a mid tracheal positioning. Enteric tube is well inside the stomach with tip likely terminating in the duodenal bulb based on the positioning of the proximal side-hole, with the tip not filmed. The heart is enlarged. Central vascular distension continues to be seen. There is increased lower zonal interstitial edema today but there are also decreased lung volumes which may exaggerate the interstitium. Small pleural effusions appear similar. There is increased atelectasis versus consolidation in the hypoinflated bases. There is a stable mediastinal configuration with aortic ectasia and tortuosity with atherosclerosis in the arch. IMPRESSION: 1. ETT tip is advanced to within 5 mm of the carina and should be withdrawn 3 cm to a mid tracheal position. 2. Increased interstitial edema versus exaggerated interstitium due to low inspiration. 3. Small pleural effusions with increased atelectasis or consolidation in the hypoinflated bases. Electronically Signed   By: Telford Nab M.D.   On: 09/26/2021 06:06     Medications:    sodium chloride 10 mL/hr at 09/27/21 0800   ceFEPime (MAXIPIME) IV Stopped (09/26/21 2200)   feeding supplement (VITAL 1.5 CAL) 1,000 mL (09/27/21 1007)   fentaNYL infusion INTRAVENOUS Stopped (09/27/21 0749)    budesonide (PULMICORT) nebulizer solution  0.5 mg Nebulization BID   chlorhexidine gluconate (MEDLINE KIT)  15 mL Mouth Rinse BID   Chlorhexidine Gluconate Cloth  6 each Topical Daily   diazepam  5 mg Per Tube Q8H   feeding supplement (PROSource TF)  90 mL Per Tube 5 X Daily   fiber  1 packet Per Tube BID   free water  50 mL Per Tube Q4H   heparin injection (subcutaneous)  5,000 Units Subcutaneous Q8H   heparin sodium (porcine)       insulin aspart  0-15 Units Subcutaneous Q4H   ipratropium-albuterol  3 mL  Nebulization Q6H   mouth rinse  15 mL Mouth Rinse 10 times per day   multivitamin  1 tablet Per Tube QHS   oxyCODONE  10 mg Per Tube Q8H   pantoprazole sodium  40 mg Per Tube Daily   polyethylene glycol  17 g Per Tube Daily   QUEtiapine  50 mg Per Tube QHS   sodium zirconium cyclosilicate  10 g Per Tube Daily   acetaminophen, acetaminophen, albuterol, alteplase, fentaNYL, guaiFENesin-dextromethorphan, heparin, heparin sodium (porcine), hydrALAZINE, lidocaine (PF), lidocaine-prilocaine, midazolam, pentafluoroprop-tetrafluoroeth  Assessment/ Plan:  [name] is a 66 y.o.  male with past medical conditions including COPD requiring 2 L nasal cannula, and congestive heart failure.  Patient presents to the emergency department with shortness of breath.  Patient has been admitted for COPD exacerbation ([place]) [J44.1] Obesity hypoventilation syndrome ([place]) [E66.2] Acute on chronic diastolic congestive heart failure ([name]) [I50.33] Acute respiratory failure with hypoxia and hypercapnia ([name]) [J96.01, J96.02] Acute on chronic respiratory failure with hypoxia and hypercapnia ([name]) [P37.90, J96.22]    Acute Kidney Injury with hyperkalemia, cause unidentified.  No IV contrast exposure and brief hypotensive event. Normal renal function 2 days prior.Renal Ultrasound shows mild right obstruction and simple cyst in  left kidney.    Would recommend urology evaluate ultrasound findings to determine if this has caused rapid decline in renal function.  Patient received initial dialysis treatment yesterday, tolerated well.  Will receive second dialysis treatment today.  Vascular access concerns noted, will perform treatment after vascular surgery has evaluated HD access.  Will receive third treatment tomorrow.  Potassium 5.7.  This will correct with dialysis.   Lab Results  Component Value Date   CREATININE 4.72 (H) 09/27/2021   CREATININE 4.15 (H) 09/26/2021   CREATININE 2.20 (H) 09/25/2021     Intake/Output Summary (Last 24 hours) at 09/27/2021 1106 Last data filed at 09/27/2021 0804 Gross per 24 hour  Intake 1544.75 ml  Output 0 ml  Net 1544.75 ml    2. Acute metabolic acidosis. Serum bicarb 22 today.  We will continue to monitor  3.  Chronic diastolic heart failure.  Echo from 08/07/2021 shows EF 60 to 65% with a mildly dilated left ventricular cavity and grade 1 diastolic dysfunction.    LOS: 8   6/14/202311:06 AM

## 2021-09-27 NOTE — Progress Notes (Signed)
Nurse Tech reported patient's blood sugar 138.  Nurse Tech accidentally hit "reject" on glucometer resulting in blood sugar value not transferring to Epic. Blood sugar reported to RN by Nurse Tech.

## 2021-09-28 ENCOUNTER — Inpatient Hospital Stay: Payer: Medicaid Other

## 2021-09-28 DIAGNOSIS — J9621 Acute and chronic respiratory failure with hypoxia: Secondary | ICD-10-CM | POA: Diagnosis not present

## 2021-09-28 DIAGNOSIS — J9622 Acute and chronic respiratory failure with hypercapnia: Secondary | ICD-10-CM | POA: Diagnosis not present

## 2021-09-28 LAB — MAGNESIUM: Magnesium: 2.2 mg/dL (ref 1.7–2.4)

## 2021-09-28 LAB — GLUCOSE, CAPILLARY
Glucose-Capillary: 100 mg/dL — ABNORMAL HIGH (ref 70–99)
Glucose-Capillary: 91 mg/dL (ref 70–99)
Glucose-Capillary: 102 mg/dL — ABNORMAL HIGH (ref 70–99)
Glucose-Capillary: 99 mg/dL (ref 70–99)
Glucose-Capillary: 84 mg/dL (ref 70–99)
Glucose-Capillary: 108 mg/dL — ABNORMAL HIGH (ref 70–99)

## 2021-09-28 LAB — BASIC METABOLIC PANEL
Anion gap: 14 (ref 5–15)
Anion gap: 12 (ref 5–15)
BUN: 140 mg/dL — ABNORMAL HIGH (ref 8–23)
BUN: 145 mg/dL — ABNORMAL HIGH (ref 8–23)
CO2: 22 mmol/L (ref 22–32)
CO2: 23 mmol/L (ref 22–32)
Calcium: 7.8 mg/dL — ABNORMAL LOW (ref 8.9–10.3)
Calcium: 7.7 mg/dL — ABNORMAL LOW (ref 8.9–10.3)
Chloride: 102 mmol/L (ref 98–111)
Chloride: 104 mmol/L (ref 98–111)
Creatinine, Ser: 5.11 mg/dL — ABNORMAL HIGH (ref 0.61–1.24)
Creatinine, Ser: 5.28 mg/dL — ABNORMAL HIGH (ref 0.61–1.24)
GFR, Estimated: 12 mL/min — ABNORMAL LOW (ref >60)
GFR, Estimated: 11 mL/min — ABNORMAL LOW (ref >60)
Glucose, Bld: 97 mg/dL (ref 70–99)
Glucose, Bld: 95 mg/dL (ref 70–99)
Potassium: 5.7 mmol/L — ABNORMAL HIGH (ref 3.5–5.1)
Potassium: 5.6 mmol/L — ABNORMAL HIGH (ref 3.5–5.1)
Sodium: 138 mmol/L (ref 135–145)
Sodium: 139 mmol/L (ref 135–145)

## 2021-09-28 LAB — CBC
HCT: 40.8 % (ref 39.0–52.0)
Hemoglobin: 11.5 g/dL — ABNORMAL LOW (ref 13.0–17.0)
MCH: 22.7 pg — ABNORMAL LOW (ref 26.0–34.0)
MCHC: 28.2 g/dL — ABNORMAL LOW (ref 30.0–36.0)
MCV: 80.6 fL (ref 80.0–100.0)
Platelets: 155 10*3/uL (ref 150–400)
RBC: 5.06 MIL/uL (ref 4.22–5.81)
RDW: 17.6 % — ABNORMAL HIGH (ref 11.5–15.5)
WBC: 9.9 10*3/uL (ref 4.0–10.5)
nRBC: 0.2 % (ref 0.0–0.2)

## 2021-09-28 LAB — POTASSIUM
Potassium: 3.8 mmol/L (ref 3.5–5.1)
Potassium: 4.6 mmol/L (ref 3.5–5.1)

## 2021-09-28 LAB — PHOSPHORUS: Phosphorus: 7.9 mg/dL — ABNORMAL HIGH (ref 2.5–4.6)

## 2021-09-28 MED ORDER — FENTANYL CITRATE (PF) 100 MCG/2ML IJ SOLN
INTRAMUSCULAR | Status: AC
  Filled 2021-09-28: qty 2

## 2021-09-28 MED ORDER — DIAZEPAM 5 MG PO TABS
2.5000 mg | ORAL_TABLET | Freq: Three times a day (TID) | ORAL | Status: DC
Start: 2021-09-28 — End: 2021-10-01
  Administered 2021-09-28 – 2021-10-01 (×10): 2.5 mg
  Filled 2021-09-28 (×10): qty 1

## 2021-09-28 MED ORDER — MIDAZOLAM HCL 2 MG/2ML IJ SOLN
INTRAMUSCULAR | Status: AC
  Filled 2021-09-28: qty 2

## 2021-09-28 MED ORDER — HEPARIN SODIUM (PORCINE) 1000 UNIT/ML IJ SOLN
INTRAMUSCULAR | Status: AC
  Administered 2021-09-28: 2800 [IU] via INTRAVENOUS_CENTRAL
  Filled 2021-09-28: qty 10

## 2021-09-28 MED ORDER — ROCURONIUM BROMIDE 10 MG/ML (PF) SYRINGE
PREFILLED_SYRINGE | INTRAVENOUS | Status: AC
  Filled 2021-09-28: qty 10

## 2021-09-28 MED ORDER — ONDANSETRON HCL 4 MG/2ML IJ SOLN
INTRAMUSCULAR | Status: AC
  Filled 2021-09-28: qty 2

## 2021-09-28 MED ORDER — INSULIN ASPART 100 UNIT/ML IV SOLN
10.0000 [IU] | Freq: Once | INTRAVENOUS | Status: AC
  Administered 2021-09-28: 10 [IU] via INTRAVENOUS
  Filled 2021-09-28: qty 0.1

## 2021-09-28 MED ORDER — LIDOCAINE-EPINEPHRINE 1 %-1:100000 IJ SOLN
INTRAMUSCULAR | Status: AC
  Filled 2021-09-28: qty 1

## 2021-09-28 MED ORDER — JUVEN PO PACK
1.0000 | PACK | Freq: Two times a day (BID) | ORAL | Status: DC
  Administered 2021-09-29 – 2021-10-10 (×18): 1

## 2021-09-28 MED ORDER — DEXTROSE 50 % IV SOLN
1.0000 | Freq: Once | INTRAVENOUS | Status: AC
  Administered 2021-09-28: 50 mL via INTRAVENOUS
  Filled 2021-09-28: qty 50

## 2021-09-28 MED ORDER — DEXAMETHASONE SODIUM PHOSPHATE 10 MG/ML IJ SOLN
INTRAMUSCULAR | Status: AC
  Filled 2021-09-28: qty 1

## 2021-09-28 MED ORDER — PROPOFOL 10 MG/ML IV BOLUS
INTRAVENOUS | Status: AC
  Filled 2021-09-28: qty 20
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
## 2021-09-28 NOTE — Progress Notes (Signed)
Hemodialysis Post Treatment Note:  Tx date:09/28/2021 Tx time: 3 hours Access:right CVC UF Removed:837 ml  Note: Pre HD BP 114/70 1055: HD started BP  12:30 BP low at 88/63 map 71 .UF goal net decreased to 1.5. 100 cc NSS flushed.repeat BP 96/65 map 74 13:15 BP drop again to 83/61 UF off, flushed 100 cc NSS repeat BP 92/70 map 69. NP Onset notified with order to maintain UF off and flush another 100cc NSS-given. 13:56 HD treatment completed. Post HD BP 100/64 map 74.
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
## 2021-09-28 NOTE — Progress Notes (Signed)
Patient's morning potassium level 5.7.   MD Piscitello rounded at bedside pre-tracheostomy procedure and notified of morning labs. MD Piscitello stated that potassium needs to be less than 5.5 for tracheostomy procedure and if patient can get dialysis this morning than he may be able to get his tracheostomy this afternoon.  MD Kasa notified of potassium and tracheostomy situation.  MD Kasa requested RN notify nephrology.   Paged and spoke with MD Candiss Norse with Nephrology, MD Candiss Norse advised would arrange dialysis treatment for patient.

## 2021-09-28 NOTE — Progress Notes (Signed)
[place] for Electrolyte Monitoring and Replacement   Recent Labs: Potassium (mmol/L)  Date Value  09/28/2021 5.6 (H)  04/15/2014 3.9   Magnesium (mg/dL)  Date Value  09/28/2021 2.2  08/21/2012 1.9   Calcium (mg/dL)  Date Value  09/28/2021 7.7 (L)   Calcium, Total (mg/dL)  Date Value  04/15/2014 8.3 (L)   Albumin (g/dL)  Date Value  09/19/2021 3.1 (L)  04/15/2014 2.9 (L)   Phosphorus (mg/dL)  Date Value  09/28/2021 7.9 (H)   Sodium (mmol/L)  Date Value  09/28/2021 139  03/15/2020 145 (H)  04/15/2014 140   Assessment: 66 y/o male with h/o PUD, OSA, HTN, CKD III, substance abuse, CHF, Hep C and COPD and COVID 19 (02/2021) who is admitted with COPD exacerbation, CHF and AKI. Pharmacy is asked to follow and replace electrolytes while in CCU.  Acute renal failure; nephrology consulted. First HD 6/13. Renal US from 6/13 showed significant amount of urine in bladder and no foley in place. Foley exchanged; 4450 mL UOP yesterday.   Pending tracheostomy 6/15 upon improvement in K+  Goal of Therapy:  Electrolytes within normal limits  Plan:  --K 5.7, Lokelma 10 g per tube daily scheduled for now --Phos 6.5, defer management to PCCM / nephrology --Follow-up electrolytes with AM labs tomorrow  Benita Gutter 09/28/2021 9:05 AM

## 2021-09-28 NOTE — Progress Notes (Signed)
[facility]  ROUNDING NOTE   Subjective:   Gary [name] is a 66 year old male with past medical conditions including COPD requiring 2 L nasal cannula, and congestive heart failure.  Patient presents to the emergency department with shortness of breath.  Patient has been admitted for COPD exacerbation ([place]) [J44.1] Obesity hypoventilation syndrome ([place]) [E66.2] Acute on chronic diastolic congestive heart failure ([name]) [I50.33] Acute respiratory failure with hypoxia and hypercapnia ([name]) [J96.01, J96.02] Acute on chronic respiratory failure with hypoxia and hypercapnia ([name]) [R67.89, J96.22]  Patient is known and has recently been followed by Dr. Holley Raring for cute kidney injury, resolved at his May appointment.    Patient seen and evaluated at bedside in ICU Critically ill-appearing Sedation raises eye lids when name called Tube feeds at 60 mL/h No pressors Intubated with vent settings 50% FiO2 and 8 PEEP Foley catheter exchanged yesterday, good urine output Rectal tube in place Moderate lower extremity edema  Creatinine 5.28 BUN 145 Potassium 5.6  Objective:  Vital signs in last 24 hours:  Temp:  [98.3 F (36.8 C)-99.7 F (37.6 C)] 98.8 F (37.1 C) (06/15 0800) Pulse Rate:  [93-101] 101 (06/15 0800) Resp:  [14-26] 15 (06/15 0800) BP: (95-127)/(59-75) 121/72 (06/15 0800) SpO2:  [88 %-98 %] 95 % (06/15 0800) FiO2 (%):  [50 %] 50 % (06/15 0800) Weight:  [189.3 kg] 189.3 kg (06/15 0420)  Weight change: 0.9 kg Filed Weights   09/26/21 1915 09/27/21 0500 09/28/21 0420  Weight: (!) 188.3 kg (!) 188.4 kg (!) 189.3 kg    Intake/Output: I/O last 3 completed shifts: In: 3076.1 [I.V.:506.1; NG/GT:2370; IV Piggyback:200] Out: 4450 [Urine:4450]   Intake/Output this shift:  Total I/O In: 10 [I.V.:10] Out: 285 [Urine:285]  Physical Exam: General: Critically ill-appearing  Head: Normocephalic, atraumatic.   Eyes: Anicteric  Lungs:  Rhonchi throughout, vent and  intubated  Heart: Regular rate and rhythm  Abdomen:  Soft, nontender, obese  Extremities: 2+ peripheral edema.  Neurologic: Nonfocal, moving all four extremities  Skin: No lesions  Access: Right IJ temp cath    Basic Metabolic Panel: Recent Labs  Lab 09/24/21 0441 09/25/21 0528 09/26/21 3810 09/27/21 0325 09/27/21 1641 09/27/21 2103 09/28/21 0412 09/28/21 0737  NA 141 142 139 138 136 139 138 139  K 4.0 4.5 5.7* 5.7* 5.4* 5.4* 5.7* 5.6*  CL 108 109 105 103 100 104 102 104  CO2 28 24 21* '22 24 23 22 23  '$ GLUCOSE 115* 115* 117* 114* 123* 139* 97 95  BUN 61* 90* 163* 119* 134* 131* 140* 145*  CREATININE 1.15 2.20* 4.15* 4.72* 5.37* 5.24* 5.11* 5.28*  CALCIUM 8.1* 8.5* 8.1* 7.9* 7.7* 7.8* 7.8* 7.7*  MG 2.8* 2.6* 2.7* 2.3  --   --  2.2  --   PHOS 2.8 4.3 7.8* 6.5*  --   --  7.9*  --      Liver Function Tests: No results for input(s): "AST", "ALT", "ALKPHOS", "BILITOT", "PROT", "ALBUMIN" in the last 168 hours. No results for input(s): "LIPASE", "AMYLASE" in the last 168 hours. No results for input(s): "AMMONIA" in the last 168 hours.  CBC: Recent Labs  Lab 09/24/21 0441 09/25/21 0528 09/26/21 0638 09/27/21 0325 09/28/21 0412  WBC 6.2 10.2 7.8 8.4 9.9  HGB 12.2* 12.9* 13.4 12.2* 11.5*  HCT 42.9 45.5 48.9 43.7 40.8  MCV 79.2* 79.8* 81.8 81.4 80.6  PLT 138* 166 117* 156 155     Cardiac Enzymes: No results for input(s): "CKTOTAL", "CKMB", "CKMBINDEX", "TROPONINI" in  the last 168 hours.  BNP: Invalid input(s): "POCBNP"  CBG: Recent Labs  Lab 09/27/21 1618 09/27/21 1943 09/27/21 2333 09/28/21 0348 09/28/21 0736  GLUCAP 145* 200* 130* 100* 31     Microbiology: Results for orders placed or performed during the hospital encounter of 09/19/21  SARS Coronavirus 2 by RT PCR (hospital order, performed in [facility] hospital lab) *cepheid single result test* Anterior Nasal Swab     Status: None   Collection Time: 09/19/21  9:57 AM   Specimen: Anterior Nasal Swab   Result Value Ref Range Status   SARS Coronavirus 2 by RT PCR NEGATIVE NEGATIVE Final    Comment: (NOTE) SARS-CoV-2 target nucleic acids are NOT DETECTED.  The SARS-CoV-2 RNA is generally detectable in upper and lower respiratory specimens during the acute phase of infection. The lowest concentration of SARS-CoV-2 viral copies this assay can detect is 250 copies / mL. A negative result does not preclude SARS-CoV-2 infection and should not be used as the sole basis for treatment or other patient management decisions.  A negative result may occur with improper specimen collection / handling, submission of specimen other than nasopharyngeal swab, presence of viral mutation(s) within the areas targeted by this assay, and inadequate number of viral copies (<250 copies / mL). A negative result must be combined with clinical observations, patient history, and epidemiological information.  Fact Sheet for Patients:   https://www.patel.info/  Fact Sheet for Healthcare Providers: https://hall.com/  This test is not yet approved or  cleared by the [place] FDA and has been authorized for detection and/or diagnosis of SARS-CoV-2 by FDA under an Emergency Use Authorization (EUA).  This EUA will remain in effect (meaning this test can be used) for the duration of the COVID-19 declaration under Section 564(b)(1) of the Act, 21 U.S.C. section 360bbb-3(b)(1), unless the authorization is terminated or revoked sooner.  Performed at [facility], [address] [place], [place] [address]   Culture, Respiratory w Gram Stain     Status: None   Collection Time: 09/19/21 11:55 AM   Specimen: SPU  Result Value Ref Range Status   Specimen Description   Final    SPUTUM Performed at [facility], [place] [address] [place], [place] [address]    Special Requests   Final    NONE Performed at [facility], [place], [place] [address]    Gram Stain   Final    FEW WBC PRESENT, PREDOMINANTLY PMN FEW GRAM POSITIVE COCCI IN PAIRS Performed at [facility], [place] [address] [place],  [address]    Culture RARE [name]  Final   Report Status 09/24/2021 FINAL  Final   Organism ID, Bacteria [name]  Final      Susceptibility   [name] - MIC*    CEFTAZIDIME 4 SENSITIVE Sensitive     CIPROFLOXACIN <=0.25 SENSITIVE Sensitive     GENTAMICIN 2 SENSITIVE Sensitive     IMIPENEM 1 SENSITIVE Sensitive     PIP/TAZO 8 SENSITIVE Sensitive     CEFEPIME 2 SENSITIVE Sensitive     * RARE [name]  MRSA Next Gen by PCR, Nasal     Status: None   Collection Time: 09/19/21  6:10 PM   Specimen: Nasal Mucosa; Nasal Swab  Result Value Ref Range Status   MRSA by PCR Next Gen NOT DETECTED NOT DETECTED Final    Comment: (NOTE) The GeneXpert MRSA Assay (FDA approved for NASAL specimens  only), is one component of a comprehensive MRSA colonization surveillance program. It is not intended to diagnose MRSA infection nor to guide or monitor treatment for MRSA infections. Test performance is not FDA approved in patients less than 8 years old. Performed at [facility], [address] [place], [place] [address]   Gastrointestinal Panel by PCR , Stool     Status: Abnormal   Collection Time: 09/21/21 10:53 AM   Specimen: Stool  Result Value Ref Range Status   Campylobacter species NOT DETECTED NOT DETECTED Final   Plesimonas shigelloides NOT DETECTED NOT DETECTED Final   Salmonella species NOT DETECTED NOT DETECTED Final   Yersinia enterocolitica DETECTED (A) NOT DETECTED Final    Comment: RESULT CALLED TO, READ BACK BY AND VERIFIED WITH: [facility] JACKSON AT 1126 09/22/21.PMF    Vibrio species NOT DETECTED NOT DETECTED Final   Vibrio cholerae NOT DETECTED NOT DETECTED Final   Enteroaggregative E coli (EAEC) NOT DETECTED NOT DETECTED Final    Enteropathogenic E coli (EPEC) NOT DETECTED NOT DETECTED Final   Enterotoxigenic E coli (ETEC) NOT DETECTED NOT DETECTED Final   Shiga like toxin producing E coli (STEC) NOT DETECTED NOT DETECTED Final   Shigella/Enteroinvasive E coli (EIEC) NOT DETECTED NOT DETECTED Final   Cryptosporidium NOT DETECTED NOT DETECTED Final   Cyclospora cayetanensis NOT DETECTED NOT DETECTED Final   Entamoeba histolytica NOT DETECTED NOT DETECTED Final   Giardia lamblia NOT DETECTED NOT DETECTED Final   Adenovirus F40/41 NOT DETECTED NOT DETECTED Final   Astrovirus NOT DETECTED NOT DETECTED Final   Norovirus GI/GII NOT DETECTED NOT DETECTED Final   Rotavirus A NOT DETECTED NOT DETECTED Final   Sapovirus (I, II, IV, and V) NOT DETECTED NOT DETECTED Final    Comment: Performed at [facility], [address] [place], [place] [address]  C Difficile Quick Screen w PCR reflex     Status: None   Collection Time: 09/21/21 11:30 AM   Specimen: STOOL  Result Value Ref Range Status   C Diff antigen NEGATIVE NEGATIVE Final   C Diff toxin NEGATIVE NEGATIVE Final   C Diff interpretation No C. difficile detected.  Final    Comment: Performed at [facility], [address] [place], [place] [address]  Culture, blood (Routine X 2) w Reflex to ID Panel     Status: None   Collection Time: 09/21/21 12:28 PM   Specimen: BLOOD  Result Value Ref Range Status   Specimen Description BLOOD BLOOD RIGHT HAND  Final   Special Requests   Final    BOTTLES DRAWN AEROBIC AND ANAEROBIC Blood Culture adequate volume   Culture   Final    NO GROWTH 5 DAYS Performed at [facility], [address], [place], [place] [address]    Report Status 09/26/2021 FINAL  Final  Culture, blood (Routine X 2) w Reflex to ID Panel     Status: None   Collection Time: 09/21/21  2:08 PM   Specimen: BLOOD  Result Value Ref Range Status   Specimen Description BLOOD BLOOD LEFT HAND  Final   Special Requests   Final     BOTTLES DRAWN AEROBIC AND ANAEROBIC Blood Culture adequate volume   Culture   Final    NO GROWTH 5 DAYS Performed at [facility], [address], [place], [place] [address]    Report Status 09/26/2021 FINAL  Final  Respiratory (~20 pathogens) panel by PCR     Status: None  Collection Time: 09/21/21  6:14 PM   Specimen: Nasopharyngeal Swab; Respiratory  Result Value Ref Range Status   Adenovirus NOT DETECTED NOT DETECTED Final   Coronavirus 229E NOT DETECTED NOT DETECTED Final    Comment: (NOTE) The Coronavirus on the Respiratory Panel, DOES NOT test for the novel  Coronavirus (2019 nCoV)    Coronavirus HKU1 NOT DETECTED NOT DETECTED Final   Coronavirus NL63 NOT DETECTED NOT DETECTED Final   Coronavirus OC43 NOT DETECTED NOT DETECTED Final   Metapneumovirus NOT DETECTED NOT DETECTED Final   Rhinovirus / Enterovirus NOT DETECTED NOT DETECTED Final   Influenza A NOT DETECTED NOT DETECTED Final   Influenza B NOT DETECTED NOT DETECTED Final   Parainfluenza Virus 1 NOT DETECTED NOT DETECTED Final   Parainfluenza Virus 2 NOT DETECTED NOT DETECTED Final   Parainfluenza Virus 3 NOT DETECTED NOT DETECTED Final   Parainfluenza Virus 4 NOT DETECTED NOT DETECTED Final   Respiratory Syncytial Virus NOT DETECTED NOT DETECTED Final   Bordetella pertussis NOT DETECTED NOT DETECTED Final   Bordetella Parapertussis NOT DETECTED NOT DETECTED Final   Chlamydophila pneumoniae NOT DETECTED NOT DETECTED Final   Mycoplasma pneumoniae NOT DETECTED NOT DETECTED Final    Comment: Performed at [facility], [address] [place], [place] [address]    Coagulation Studies: Recent Labs    09/27/21 1016  LABPROT 14.8  INR 1.2     Urinalysis: No results for input(s): "COLORURINE", "LABSPEC", "PHURINE", "GLUCOSEU", "HGBUR", "BILIRUBINUR", "KETONESUR", "PROTEINUR", "UROBILINOGEN", "NITRITE", "LEUKOCYTESUR" in the last 72 hours.  Invalid input(s): "APPERANCEUR"    Imaging: DG Abd 1  View  Result Date: 09/28/2021 CLINICAL DATA:  Encounter for OG tube placement. EXAM: ABDOMEN - 1 VIEW COMPARISON:  September 19, 2021. FINDINGS: Limited imaging of the abdomen for tube placement includes mid abdomen and upper abdomen only and excludes much of the LEFT flank. Gastric tube is coiled in the stomach, tip in the distal stomach or proximal duodenum, difficult to assessed currently. If gastric placement is desired approximally 8-10 cm retraction may be helpful. EKG leads project over the abdomen. No acute skeletal process. Gaseous distension of LEFT upper quadrant bowel loops. IMPRESSION: Gastric tube is coiled in the stomach, tip in the distal stomach or proximal duodenum, difficult to assessed currently. If gastric placement is desired approximally 8-10 cm retraction may be helpful. Gaseous distension of bowel loops in the LEFT abdomen not well assessed perhaps increased from previous imaging. Electronically Signed   By: Zetta Bills M.D.   On: 09/28/2021 09:40   DG Chest Port 1 View  Result Date: 09/26/2021 CLINICAL DATA:  Central line placement. EXAM: PORTABLE CHEST 1 VIEW COMPARISON:  Radiographs 09/26/2021 and 09/23/2021.  CT 07/20/2021. FINDINGS: 1230 hours. Tip of the endotracheal tube is now well proximally 1.7 cm above the carina. Enteric tube projects below the diaphragm, tip not visualized. There is a new right IJ central venous catheter which projects to the level of the lower SVC. Persistent low lung volumes with bibasilar atelectasis and probable mild edema. No pneumothorax or significant pleural effusion. Stable cardiomegaly and aortic atherosclerosis. No acute osseous findings are evident. Telemetry leads overlie the chest. IMPRESSION: 1. New right IJ central venous catheter projects to the lower SVC level. No evidence of pneumothorax. 2. Endotracheal tube remains low, proximally 1.7 cm above the carina. 3. No change in bilateral airspace opacities. Electronically Signed   By: Richardean Sale M.D.   On: 09/26/2021 12:41   US RENAL  Result Date: 09/26/2021 CLINICAL DATA:  Acute on chronic renal failure EXAM: RENAL / URINARY TRACT ULTRASOUND COMPLETE COMPARISON:  None Available. FINDINGS: Evaluation is somewhat limited by body habitus. Right Kidney: Renal measurements: 13.8 x 6.8 x 6.0 cm = volume: 294 mL. Echogenicity within normal limits. No mass visualized. Mild hydronephrosis. Left Kidney: Renal measurements: 12.6 x 6.4 x 5.8 cm = volume: 244 mL. Echogenicity within normal limits. No hydronephrosis visualized. In the superior left kidney, there is a 2.4 x 2.7 x 2.8 cm anechoic lesion, consistent with a simple cyst. Bladder: Appears normal for degree of bladder distention. No Foley is seen within the bladder. Bladder volume: 1389 mL. Other: None. IMPRESSION: 1. Mild right hydronephrosis. 2. Simple cyst in the superior left kidney. Electronically Signed   By: Merilyn Baba M.D.   On: 09/26/2021 11:58     Medications:    sodium chloride 10 mL/hr at 09/28/21 0700   ceFEPime (MAXIPIME) IV Stopped (09/27/21 2222)   feeding supplement (VITAL 1.5 CAL) Stopped (09/28/21 0000)   fentaNYL infusion INTRAVENOUS Stopped (09/28/21 0507)    budesonide (PULMICORT) nebulizer solution  0.5 mg Nebulization BID   Chlorhexidine Gluconate Cloth  6 each Topical Daily   diazepam  5 mg Per Tube Q8H   feeding supplement (PROSource TF)  90 mL Per Tube 5 X Daily   fiber  1 packet Per Tube BID   free water  50 mL Per Tube Q4H   heparin sodium (porcine)       insulin aspart  0-15 Units Subcutaneous Q4H   ipratropium-albuterol  3 mL Nebulization Q6H   multivitamin  1 tablet Per Tube QHS   mouth rinse  15 mL Mouth Rinse Q2H   oxyCODONE  10 mg Per Tube Q8H   pantoprazole sodium  40 mg Per Tube Daily   polyethylene glycol  17 g Per Tube Daily   QUEtiapine  50 mg Per Tube QHS   sodium zirconium cyclosilicate  10 g Per Tube Daily   acetaminophen, acetaminophen, albuterol, alteplase, fentaNYL,  guaiFENesin-dextromethorphan, heparin, heparin sodium (porcine), hydrALAZINE, lidocaine (PF), lidocaine-prilocaine, midazolam, mouth rinse, pentafluoroprop-tetrafluoroeth  Assessment/ Plan:  Gary [name] is a 66 y.o.  male with past medical conditions including COPD requiring 2 L nasal cannula, and congestive heart failure.  Patient presents to the emergency department with shortness of breath.  Patient has been admitted for COPD exacerbation ([place]) [J44.1] Obesity hypoventilation syndrome ([place]) [E66.2] Acute on chronic diastolic congestive heart failure ([name]) [I50.33] Acute respiratory failure with hypoxia and hypercapnia ([name]) [J96.01, J96.02] Acute on chronic respiratory failure with hypoxia and hypercapnia ([name]) [I96.78, J96.22]    Acute Kidney Injury with hyperkalemia, cause unidentified.  No IV contrast exposure and brief hypotensive event. Normal renal function 2 days prior.Renal Ultrasound shows mild right obstruction and simple cyst in  left kidney.    Urinary retention seen on renal ultrasound without visible foley. Foley catheter exchanged with good urine output. Urine output 4.45L in past 24 hours. BUN continues to rise. We will provide dialysis today, no UF. Will continue to monitor renal recovery.   Lab Results  Component Value Date   CREATININE 5.28 (H) 09/28/2021   CREATININE 5.11 (H) 09/28/2021   CREATININE 5.24 (H) 09/27/2021    Intake/Output Summary (Last 24 hours) at 09/28/2021 1008 Last data filed at 09/28/2021 0800 Gross per 24 hour  Intake 1656.55 ml  Output 4735 ml  Net -3078.45 ml    2. Acute metabolic acidosis. Resolved  3.  Chronic  diastolic heart failure.  Echo from 08/07/2021 shows EF 60 to 65% with a mildly dilated left ventricular cavity and grade 1 diastolic dysfunction.    LOS: 9 [place] 6/15/202310:08 AM

## 2021-09-29 DIAGNOSIS — J9602 Acute respiratory failure with hypercapnia: Secondary | ICD-10-CM | POA: Diagnosis not present

## 2021-09-29 DIAGNOSIS — J9601 Acute respiratory failure with hypoxia: Secondary | ICD-10-CM | POA: Diagnosis not present

## 2021-09-29 DIAGNOSIS — G9341 Metabolic encephalopathy: Secondary | ICD-10-CM | POA: Diagnosis not present

## 2021-09-29 DIAGNOSIS — J9621 Acute and chronic respiratory failure with hypoxia: Secondary | ICD-10-CM | POA: Diagnosis not present

## 2021-09-29 LAB — BASIC METABOLIC PANEL
Anion gap: 9 (ref 5–15)
BUN: 96 mg/dL — ABNORMAL HIGH (ref 8–23)
CO2: 30 mmol/L (ref 22–32)
Calcium: 7.8 mg/dL — ABNORMAL LOW (ref 8.9–10.3)
Chloride: 100 mmol/L (ref 98–111)
Creatinine, Ser: 3.75 mg/dL — ABNORMAL HIGH (ref 0.61–1.24)
GFR, Estimated: 17 mL/min — ABNORMAL LOW (ref >60)
Glucose, Bld: 105 mg/dL — ABNORMAL HIGH (ref 70–99)
Potassium: 4.2 mmol/L (ref 3.5–5.1)
Sodium: 139 mmol/L (ref 135–145)

## 2021-09-29 LAB — GLUCOSE, CAPILLARY
Glucose-Capillary: 108 mg/dL — ABNORMAL HIGH (ref 70–99)
Glucose-Capillary: 122 mg/dL — ABNORMAL HIGH (ref 70–99)
Glucose-Capillary: 131 mg/dL — ABNORMAL HIGH (ref 70–99)
Glucose-Capillary: 134 mg/dL — ABNORMAL HIGH (ref 70–99)
Glucose-Capillary: 136 mg/dL — ABNORMAL HIGH (ref 70–99)
Glucose-Capillary: 97 mg/dL (ref 70–99)

## 2021-09-29 LAB — PHOSPHORUS: Phosphorus: 6.6 mg/dL — ABNORMAL HIGH (ref 2.5–4.6)

## 2021-09-29 LAB — POTASSIUM: Potassium: 4.3 mmol/L (ref 3.5–5.1)

## 2021-09-29 LAB — MAGNESIUM: Magnesium: 1.9 mg/dL (ref 1.7–2.4)

## 2021-09-29 MED ORDER — PROSOURCE TF PO LIQD
90.0000 mL | Freq: Three times a day (TID) | ORAL | Status: DC
  Administered 2021-09-29 – 2021-10-11 (×29): 90 mL
  Filled 2021-09-29 (×39): qty 90

## 2021-09-29 MED ORDER — PIVOT 1.5 CAL PO LIQD: 1000.0000 mL | ORAL | Status: DC

## 2021-09-29 MED ORDER — FREE WATER
30.0000 mL | Status: DC
  Administered 2021-09-29 – 2021-10-11 (×56): 30 mL

## 2021-09-29 MED ORDER — HEPARIN SODIUM (PORCINE) 5000 UNIT/ML IJ SOLN
5000.0000 [IU] | Freq: Three times a day (TID) | INTRAMUSCULAR | Status: DC
  Administered 2021-09-29 – 2021-10-02 (×8): 5000 [IU] via SUBCUTANEOUS
  Filled 2021-09-29 (×8): qty 1

## 2021-09-29 MED ORDER — NEPRO/CARBSTEADY PO LIQD
1000.0000 mL | ORAL | Status: DC
  Administered 2021-09-29 – 2021-10-10 (×7): 1000 mL

## 2021-09-29 MED ORDER — SODIUM CHLORIDE 0.9 % IV SOLN
2.0000 g | Freq: Two times a day (BID) | INTRAVENOUS | Status: AC
  Administered 2021-09-29 – 2021-09-30 (×4): 2 g via INTRAVENOUS
  Filled 2021-09-29 (×4): qty 2

## 2021-09-29 MED ORDER — PROSOURCE TF PO LIQD
45.0000 mL | Freq: Two times a day (BID) | ORAL | Status: DC
Start: 2021-09-29 — End: 2021-09-29

## 2021-09-29 NOTE — Progress Notes (Signed)
[place] for Electrolyte Monitoring and Replacement   Recent Labs: Potassium (mmol/L)  Date Value  09/29/2021 4.2  04/15/2014 3.9   Magnesium (mg/dL)  Date Value  09/29/2021 1.9  08/21/2012 1.9   Calcium (mg/dL)  Date Value  09/29/2021 7.8 (L)   Calcium, Total (mg/dL)  Date Value  04/15/2014 8.3 (L)   Albumin (g/dL)  Date Value  09/19/2021 3.1 (L)  04/15/2014 2.9 (L)   Phosphorus (mg/dL)  Date Value  09/29/2021 6.6 (H)   Sodium (mmol/L)  Date Value  09/29/2021 139  03/15/2020 145 (H)  04/15/2014 140   Assessment: 66 y/o male with h/o PUD, OSA, HTN, CKD III, substance abuse, CHF, Hep C and COPD and COVID 19 (02/2021) who is admitted with COPD exacerbation, CHF and AKI. Pharmacy is asked to follow and replace electrolytes while in CCU.  Acute renal failure; nephrology consulted. [facility] HD 6/13. Renal US from 6/13 showed significant amount of urine in bladder and no foley in place. Foley exchanged; 4450 mL UOP 6/14, 2245 UOP 6/15  Goal of Therapy:  Electrolytes within normal limits  Plan:  --Hyperkalemia resolved, discontinue Lokelma --Phos 6.5, defer management to [facility] / nephrology --Follow-up electrolytes with AM labs tomorrow  Benita Gutter 09/29/2021 8:13 AM

## 2021-09-29 NOTE — Progress Notes (Signed)
[facility]  ROUNDING NOTE   Subjective:   Gary [name] is a 66 year old male with past medical conditions including COPD requiring 2 L nasal cannula, and congestive heart failure.  Patient presents to the emergency department with shortness of breath.  Patient has been admitted for COPD exacerbation ([place]) [J44.1] Obesity hypoventilation syndrome ([place]) [E66.2] Acute on chronic diastolic congestive heart failure ([name]) [I50.33] Acute respiratory failure with hypoxia and hypercapnia ([name]) [J96.01, J96.02] Acute on chronic respiratory failure with hypoxia and hypercapnia ([name]) [N05.39, J96.22]  Patient is known and has recently been followed by Dr. Holley Raring for cute kidney injury, resolved at his May appointment.    Critically ill-appearing No response to name today Tube feeds at 60 mL/h No pressors Intubated with vent settings 50% FiO2 and 8 PEEP Foley catheter with good urine output, UOP 2253m Rectal tube in place Moderate lower extremity edema  Creatinine 3.75 BUN 96 Potassium 4.2  Objective:  Vital signs in last 24 hours:  Temp:  [98 F (36.7 C)-98.8 F (37.1 C)] 98 F (36.7 C) (06/16 1200) Pulse Rate:  [91-99] 92 (06/16 0800) Resp:  [13-20] 16 (06/16 1400) BP: (105-130)/(58-81) 129/70 (06/16 1400) SpO2:  [94 %-100 %] 94 % (06/16 1205) FiO2 (%):  [50 %] 50 % (06/16 1205) Weight:  [153.8 kg] 153.8 kg (06/16 0406)  Weight change: -3.1 kg Filed Weights   09/28/21 1043 09/28/21 1414 09/29/21 0406  Weight: (!) 186.2 kg (!) 185.1 kg (!) 153.8 kg    Intake/Output: I/O last 3 completed shifts: In: 1533.9 [I.V.:557.8; NG/GT:776; IV Piggyback:200.1] Out: 47673 [ALPFX:9024 Other:837]   Intake/Output this shift:  Total I/O In: 345.7 [I.V.:65.6; NG/GT:180; IV Piggyback:100.1] Out: 450 [Urine:450]  Physical Exam: General: Critically ill-appearing  Head: Normocephalic, atraumatic.   Eyes: Anicteric  Lungs:  Rhonchi throughout, vent and intubated  Heart:  Regular rate and rhythm  Abdomen:  Soft, nontender, obese  Extremities: 2+ peripheral edema.  Neurologic: Nonfocal, moving all four extremities  Skin: No lesions  Access: Right IJ temp cath    Basic Metabolic Panel: Recent Labs  Lab 09/25/21 0528 09/26/21 [id number]/14/23 0325 09/27/21 1641 09/27/21 2103 09/28/21 0412 09/28/21 0737 09/28/21 1346 09/28/21 1747 09/28/21 2344 09/29/21 0410  NA 142 139 138 136 139 138 139  --   --   --  139  K 4.5 5.7* 5.7* 5.4* 5.4* 5.7* 5.6* 3.8 4.6 4.3 4.2  CL 109 105 103 100 104 102 104  --   --   --  100  CO2 24 21* '22 24 23 22 23  '$ --   --   --  30  GLUCOSE 115* 117* 114* 123* 139* 97 95  --   --   --  105*  BUN 90* 163* 119* 134* 131* 140* 145*  --   --   --  96*  CREATININE 2.20* 4.15* 4.72* 5.37* 5.24* 5.11* 5.28*  --   --   --  3.75*  CALCIUM 8.5* 8.1* 7.9* 7.7* 7.8* 7.8* 7.7*  --   --   --  7.8*  MG 2.6* 2.7* 2.3  --   --  2.2  --   --   --   --  1.9  PHOS 4.3 7.8* 6.5*  --   --  7.9*  --   --   --   --  6.6*     Liver Function Tests: No results for input(s): "AST", "ALT", "ALKPHOS", "BILITOT", "PROT", "ALBUMIN" in the last 168 hours. No results  for input(s): "LIPASE", "AMYLASE" in the last 168 hours. No results for input(s): "AMMONIA" in the last 168 hours.  CBC: Recent Labs  Lab 09/24/21 0441 09/25/21 0528 09/26/21 0638 09/27/21 0325 09/28/21 0412  WBC 6.2 10.2 7.8 8.4 9.9  HGB 12.2* 12.9* 13.4 12.2* 11.5*  HCT 42.9 45.5 48.9 43.7 40.8  MCV 79.2* 79.8* 81.8 81.4 80.6  PLT 138* 166 117* 156 155     Cardiac Enzymes: No results for input(s): "CKTOTAL", "CKMB", "CKMBINDEX", "TROPONINI" in the last 168 hours.  BNP: Invalid input(s): "POCBNP"  CBG: Recent Labs  Lab 09/28/21 1936 09/28/21 2323 09/29/21 0322 09/29/21 0728 09/29/21 1121  GLUCAP 84 108* 97 134* 136*     Microbiology: Results for orders placed or performed during the hospital encounter of 09/19/21  SARS Coronavirus 2 by RT PCR (hospital order,  performed in [facility] hospital lab) *cepheid single result test* Anterior Nasal Swab     Status: None   Collection Time: 09/19/21  9:57 AM   Specimen: Anterior Nasal Swab  Result Value Ref Range Status   SARS Coronavirus 2 by RT PCR NEGATIVE NEGATIVE Final    Comment: (NOTE) SARS-CoV-2 target nucleic acids are NOT DETECTED.  The SARS-CoV-2 RNA is generally detectable in upper and lower respiratory specimens during the acute phase of infection. The lowest concentration of SARS-CoV-2 viral copies this assay can detect is 250 copies / mL. A negative result does not preclude SARS-CoV-2 infection and should not be used as the sole basis for treatment or other patient management decisions.  A negative result may occur with improper specimen collection / handling, submission of specimen other than nasopharyngeal swab, presence of viral mutation(s) within the areas targeted by this assay, and inadequate number of viral copies (<250 copies / mL). A negative result must be combined with clinical observations, patient history, and epidemiological information.  Fact Sheet for Patients:   https://www.patel.info/  Fact Sheet for Healthcare Providers: https://hall.com/  This test is not yet approved or  cleared by the [place] FDA and has been authorized for detection and/or diagnosis of SARS-CoV-2 by FDA under an Emergency Use Authorization (EUA).  This EUA will remain in effect (meaning this test can be used) for the duration of the COVID-19 declaration under Section 564(b)(1) of the Act, 21 U.S.C. section 360bbb-3(b)(1), unless the authorization is terminated or revoked sooner.  Performed at [facility], [address] [place], [place] [address]   Culture, Respiratory w Gram Stain     Status: None   Collection Time: 09/19/21 11:55 AM   Specimen: SPU  Result Value Ref Range Status   Specimen Description   Final     SPUTUM Performed at [facility], [place] [address] [place], [place] [address]    Special Requests   Final    NONE Performed at [facility], [place], [place] [address]    Gram Stain   Final    FEW WBC PRESENT, PREDOMINANTLY PMN FEW GRAM POSITIVE COCCI IN PAIRS Performed at [facility], [place] [address] [place], [place] [address]    Culture RARE [name]  Final   Report Status 09/24/2021 FINAL  Final   Organism ID, Bacteria [name]  Final      Susceptibility   [name] - MIC*    CEFTAZIDIME 4 SENSITIVE Sensitive     CIPROFLOXACIN <=0.25 SENSITIVE Sensitive     GENTAMICIN 2 SENSITIVE Sensitive     IMIPENEM 1 SENSITIVE  Sensitive     PIP/TAZO 8 SENSITIVE Sensitive     CEFEPIME 2 SENSITIVE Sensitive     * RARE [name]  MRSA Next Gen by PCR, Nasal     Status: None   Collection Time: 09/19/21  6:10 PM   Specimen: Nasal Mucosa; Nasal Swab  Result Value Ref Range Status   MRSA by PCR Next Gen NOT DETECTED NOT DETECTED Final    Comment: (NOTE) The GeneXpert MRSA Assay (FDA approved for NASAL specimens only), is one component of a comprehensive MRSA colonization surveillance program. It is not intended to diagnose MRSA infection nor to guide or monitor treatment for MRSA infections. Test performance is not FDA approved in patients less than 43 years old. Performed at [facility], [address] [place], [place] [address]   Gastrointestinal Panel by PCR , Stool     Status: Abnormal   Collection Time: 09/21/21 10:53 AM   Specimen: Stool  Result Value Ref Range Status   Campylobacter species NOT DETECTED NOT DETECTED Final   Plesimonas shigelloides NOT DETECTED NOT DETECTED Final   Salmonella species NOT DETECTED NOT DETECTED Final   Yersinia enterocolitica DETECTED (A) NOT DETECTED Final    Comment: RESULT CALLED TO, READ BACK BY AND VERIFIED WITH: [facility] JACKSON AT 1126  09/22/21.PMF    Vibrio species NOT DETECTED NOT DETECTED Final   Vibrio cholerae NOT DETECTED NOT DETECTED Final   Enteroaggregative E coli (EAEC) NOT DETECTED NOT DETECTED Final   Enteropathogenic E coli (EPEC) NOT DETECTED NOT DETECTED Final   Enterotoxigenic E coli (ETEC) NOT DETECTED NOT DETECTED Final   Shiga like toxin producing E coli (STEC) NOT DETECTED NOT DETECTED Final   Shigella/Enteroinvasive E coli (EIEC) NOT DETECTED NOT DETECTED Final   Cryptosporidium NOT DETECTED NOT DETECTED Final   Cyclospora cayetanensis NOT DETECTED NOT DETECTED Final   Entamoeba histolytica NOT DETECTED NOT DETECTED Final   Giardia lamblia NOT DETECTED NOT DETECTED Final   Adenovirus F40/41 NOT DETECTED NOT DETECTED Final   Astrovirus NOT DETECTED NOT DETECTED Final   Norovirus GI/GII NOT DETECTED NOT DETECTED Final   Rotavirus A NOT DETECTED NOT DETECTED Final   Sapovirus (I, II, IV, and V) NOT DETECTED NOT DETECTED Final    Comment: Performed at [facility], [address] [place], [place] [address]  C Difficile Quick Screen w PCR reflex     Status: None   Collection Time: 09/21/21 11:30 AM   Specimen: STOOL  Result Value Ref Range Status   C Diff antigen NEGATIVE NEGATIVE Final   C Diff toxin NEGATIVE NEGATIVE Final   C Diff interpretation No C. difficile detected.  Final    Comment: Performed at [facility], [address] [place], [place] [address]  Culture, blood (Routine X 2) w Reflex to ID Panel     Status: None   Collection Time: 09/21/21 12:28 PM   Specimen: BLOOD  Result Value Ref Range Status   Specimen Description BLOOD BLOOD RIGHT HAND  Final   Special Requests   Final    BOTTLES DRAWN AEROBIC AND ANAEROBIC Blood Culture adequate volume   Culture   Final    NO GROWTH 5 DAYS Performed at [facility], [address] [place], [place] [address]    Report Status 09/26/2021 FINAL  Final  Culture, blood (Routine X 2) w Reflex to ID Panel      Status: None   Collection Time: 09/21/21  2:08 PM   Specimen:  BLOOD  Result Value Ref Range Status   Specimen Description BLOOD BLOOD LEFT HAND  Final   Special Requests   Final    BOTTLES DRAWN AEROBIC AND ANAEROBIC Blood Culture adequate volume   Culture   Final    NO GROWTH 5 DAYS Performed at [facility], [address] [place], [place] [address]    Report Status 09/26/2021 FINAL  Final  Respiratory (~20 pathogens) panel by PCR     Status: None   Collection Time: 09/21/21  6:14 PM   Specimen: Nasopharyngeal Swab; Respiratory  Result Value Ref Range Status   Adenovirus NOT DETECTED NOT DETECTED Final   Coronavirus 229E NOT DETECTED NOT DETECTED Final    Comment: (NOTE) The Coronavirus on the Respiratory Panel, DOES NOT test for the novel  Coronavirus (2019 nCoV)    Coronavirus HKU1 NOT DETECTED NOT DETECTED Final   Coronavirus NL63 NOT DETECTED NOT DETECTED Final   Coronavirus OC43 NOT DETECTED NOT DETECTED Final   Metapneumovirus NOT DETECTED NOT DETECTED Final   Rhinovirus / Enterovirus NOT DETECTED NOT DETECTED Final   Influenza A NOT DETECTED NOT DETECTED Final   Influenza B NOT DETECTED NOT DETECTED Final   Parainfluenza Virus 1 NOT DETECTED NOT DETECTED Final   Parainfluenza Virus 2 NOT DETECTED NOT DETECTED Final   Parainfluenza Virus 3 NOT DETECTED NOT DETECTED Final   Parainfluenza Virus 4 NOT DETECTED NOT DETECTED Final   Respiratory Syncytial Virus NOT DETECTED NOT DETECTED Final   Bordetella pertussis NOT DETECTED NOT DETECTED Final   Bordetella Parapertussis NOT DETECTED NOT DETECTED Final   Chlamydophila pneumoniae NOT DETECTED NOT DETECTED Final   Mycoplasma pneumoniae NOT DETECTED NOT DETECTED Final    Comment: Performed at [facility], [place] [address], [place], [place] [address]    Coagulation Studies: Recent Labs    09/27/21 1016  LABPROT 14.8  INR 1.2     Urinalysis: No results for input(s): "COLORURINE", "LABSPEC", "PHURINE",  "GLUCOSEU", "HGBUR", "BILIRUBINUR", "KETONESUR", "PROTEINUR", "UROBILINOGEN", "NITRITE", "LEUKOCYTESUR" in the last 72 hours.  Invalid input(s): "APPERANCEUR"    Imaging: DG Abd 1 View  Result Date: 09/28/2021 CLINICAL DATA:  Encounter for OG tube placement. EXAM: ABDOMEN - 1 VIEW COMPARISON:  September 19, 2021. FINDINGS: Limited imaging of the abdomen for tube placement includes mid abdomen and upper abdomen only and excludes much of the LEFT flank. Gastric tube is coiled in the stomach, tip in the distal stomach or proximal duodenum, difficult to assessed currently. If gastric placement is desired approximally 8-10 cm retraction may be helpful. EKG leads project over the abdomen. No acute skeletal process. Gaseous distension of LEFT upper quadrant bowel loops. IMPRESSION: Gastric tube is coiled in the stomach, tip in the distal stomach or proximal duodenum, difficult to assessed currently. If gastric placement is desired approximally 8-10 cm retraction may be helpful. Gaseous distension of bowel loops in the LEFT abdomen not well assessed perhaps increased from previous imaging. Electronically Signed   By: Zetta Bills M.D.   On: 09/28/2021 09:40     Medications:    sodium chloride 10 mL/hr at 09/29/21 1100   ceFEPime (MAXIPIME) IV Stopped (09/29/21 1124)   feeding supplement (NEPRO CARB STEADY)     fentaNYL infusion INTRAVENOUS Stopped (09/29/21 1054)    budesonide (PULMICORT) nebulizer solution  0.5 mg Nebulization BID   Chlorhexidine Gluconate Cloth  6 each Topical Daily   diazepam  2.5 mg Per Tube Q8H   feeding supplement (PROSource TF)  90  mL Per Tube TID   free water  30 mL Per Tube Q4H   heparin injection (subcutaneous)  5,000 Units Subcutaneous Q8H   insulin aspart  0-15 Units Subcutaneous Q4H   ipratropium-albuterol  3 mL Nebulization Q6H   multivitamin  1 tablet Per Tube QHS   nutrition supplement (JUVEN)  1 packet Per Tube BID BM   mouth rinse  15 mL Mouth Rinse Q2H   oxyCODONE   10 mg Per Tube Q8H   pantoprazole sodium  40 mg Per Tube Daily   polyethylene glycol  17 g Per Tube Daily   QUEtiapine  50 mg Per Tube QHS   acetaminophen, acetaminophen, albuterol, alteplase, fentaNYL, guaiFENesin-dextromethorphan, heparin, hydrALAZINE, lidocaine (PF), lidocaine-prilocaine, midazolam, mouth rinse, pentafluoroprop-tetrafluoroeth  Assessment/ Plan:  [name] is a 66 y.o.  male with past medical conditions including COPD requiring 2 L nasal cannula, and congestive heart failure.  Patient presents to the emergency department with shortness of breath.  Patient has been admitted for COPD exacerbation ([place]) [J44.1] Obesity hypoventilation syndrome ([place]) [E66.2] Acute on chronic diastolic congestive heart failure ([name]) [I50.33] Acute respiratory failure with hypoxia and hypercapnia ([name]) [J96.01, J96.02] Acute on chronic respiratory failure with hypoxia and hypercapnia ([name]) [B01.75, J96.22]    Acute Kidney Injury with hyperkalemia, cause unidentified.  No IV contrast exposure and brief hypotensive event. Normal renal function 2 days prior.Renal Ultrasound shows mild right obstruction and simple cyst in  left kidney.    Received dialysis yesterday, UF 851m achieved. Next treatment scheduled for Saturday. Adequate urine output noted. Will continue dialysis to optimize metabolic status.   Lab Results  Component Value Date   CREATININE 3.75 (H) 09/29/2021   CREATININE 5.28 (H) 09/28/2021   CREATININE 5.11 (H) 09/28/2021    Intake/Output Summary (Last 24 hours) at 09/29/2021 1424 Last data filed at 09/29/2021 1200 Gross per 24 hour  Intake 1233 ml  Output 1895 ml  Net -662 ml    2. Acute metabolic acidosis. Resolved  3.  Chronic diastolic heart failure.  Echo from 08/07/2021 shows EF 60 to 65% with a mildly dilated left ventricular cavity and grade 1 diastolic dysfunction.    LOS: [id number]/16/20232:24 PM
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
## 2021-09-29 NOTE — Progress Notes (Signed)
Nutrition Follow Up Note   DOCUMENTATION CODES:   Morbid obesity  INTERVENTION:   Change to Nepro _0 /hr + ProSource TF 36m TID via tube   Free water flushes 316mq4 hours to maintain tube patency   Regimen provides 3264kcal/day, 203g/day protein and 140124may of free water.   Will add vitamin D supplementation with diet advancement   Juven Fruit Punch BID via tube, each serving provides 95kcal and 2.5g of protein (amino acids glutamine and arginine)  Rena-vit daily via tube   NUTRITION DIAGNOSIS:   Inadequate oral intake related to inability to eat (pt sedated and ventilated) as evidenced by NPO status.  GOAL:   Provide needs based on ASPEN/SCCM guidelines -met   MONITOR:   Vent status, Labs, Weight trends, TF tolerance, Skin, I & O's  ASSESSMENT:   [age] male with h/o PUD, OSA, HTN, CKD III, substance abuse, CHF, Hep C and COPD and COVID 19 (02/2021) who is admitted with COPD exacerbation, CHF and AKI. 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  Pt remains sedated and ventilated. OGT in place. Pt tolerating tube feeds well at goal rate; will adjust formula today in relation to hyperkalemia. No BM since 6/14; fiber discontinued. Rectal tube removed as pt with new pressure injury. Per chart, pt is fairly weight stable since admission. Pt +7.8L on his I & Os. Pt initiated on HD 6/13 and is tolerating well. Trach rescheduled for 6/20.   Medications reviewed and include: heparin, insulin, rena-vit, juven, oxycodone, protonix, miralax, cefepime  Labs reviewed: K 4.2 wnl, BUN 96(H), creat 3.75(H), P 6.6(H), Mg 1.9 wnl Vitamin D- 13.06(L)- 1/20 Cbgs- 136, 134, 97 x 24 hrs  Patient is currently intubated on ventilator support MV: 8.7 L/min Temp (24hrs), Avg:98.4 F (36.9 C), Min:98.1 F (36.7 C), Max:98.8 F (37.1 C)  Propofol: none   MAP- >32m71m  UOP- 2245ml53m   Diet Order:    Diet Order             Diet NPO time specified  Diet  effective midnight                  EDUCATION NEEDS:   No education needs have been identified at this time  Skin:  Skin Assessment: Reviewed RN Assessment (Stage II perineum, MASD)  Last BM:  6/14- type 7  Height:   Ht Readings from Last 1 Encounters:  09/28/21 _1  (1.905 m)    Weight:   Wt Readings from Last 1 Encounters:  09/29/21 (!) 153.8 kg    Ideal Body Weight:  89 kg  BMI:  Body mass index is 42.37 kg/m.  Estimated Nutritional Needs:   Kcal:  3000-3300kcal/day  Protein:  178-225g/day  Fluid:  2.0L/day  CaseyKoleen DistanceRD, LDN Please refer to [facility] and/or RD on-call/weekend/after hours pager

## 2021-09-29 NOTE — TOC Progression Note (Signed)
Transition of Care [facility]) - Progression Note    Patient Details  Name: Gary [name] MRN: [id number] Date of Birth: 1955/04/22  [facility] CM/SW Contact  Shelbie Hutching, RN Phone Number: 09/29/2021, 8:37 AM  Clinical Narrative:     Patient's potassium was too high yesterday for trach procedure.  Patient's trach now scheduled for 6/20.  Expected Discharge Plan: Long Term Acute Care (LTAC) Barriers to Discharge: Continued Medical Work up  Expected Discharge Plan and Services Expected Discharge Plan: Long Term Acute Care (LTAC)   Discharge Planning Services: CM Consult Post Acute Care Choice: Long Term Acute Care (LTAC) Living arrangements for the past 2 months: Single Family Home                 DME Arranged: N/A DME Agency: NA       HH Arranged: NA HH Agency: NA         Social Determinants of Health (SDOH) Interventions    Readmission Risk Interventions    05/04/2021    1:26 PM 04/07/2021    1:39 PM 03/12/2021   11:54 AM  Readmission Risk Prevention Plan  Transportation Screening Complete Complete Complete  PCP or Specialist Appt within 3-5 Days  Complete Complete  HRI or [place]   Complete  Social Work Consult for [place] Planning/Counseling   Complete  Palliative Care Screening  Not Applicable Not Applicable  Medication Review Press photographer) Complete Complete Complete  PCP or Specialist appointment within 3-5 days of discharge Complete    HRI or [place] Complete    SW Recovery Care/Counseling Consult Complete    [place] Not Applicable

## 2021-09-29 NOTE — Progress Notes (Signed)
Patient with airway concerns. RN at bedside. Tube noted to be at 20cm at lip. Able to advance tube back to 27 cm per documentation. BBS noted. Et tube holder changed. Skin intact no breakdown noted. Will continue to monitor.

## 2021-09-29 NOTE — Progress Notes (Addendum)
PHARMACY NOTE:  ANTIMICROBIAL RENAL DOSAGE ADJUSTMENT  Current antimicrobial regimen includes a mismatch between antimicrobial dosage and estimated renal function.  As per policy approved by the Pharmacy & Therapeutics and Medical Executive Committees, the antimicrobial dosage will be adjusted accordingly.  Current antimicrobial dosage: Cefepime 1 g IV q24h  Indication: Pseudomonas pneumonia  Renal Function:  Estimated Creatinine Clearance: 31.6 mL/min (A) (by C-G formula based on SCr of 3.75 mg/dL (H)). '[x]'$      On intermittent HD    Antimicrobial dosage has been changed to:  Cefepime 2 g IV q12h  Comments: Patient with suspected obstructive AKI. Renal ultrasound 6/13 noted large volume urine in bladder and no foley was visualized. Foley was exchanged and patient put out 4450 mL UOP 6/14 and 2245 mL UOP 6/15. Underwent additional HD session 6/15 due to hyperkalemia which is now resolved.  Thank you for allowing pharmacy to be a part of this patient's care.  Benita Gutter, [facility] 09/29/2021 8:15 AM

## 2021-09-30 DIAGNOSIS — J9601 Acute respiratory failure with hypoxia: Secondary | ICD-10-CM | POA: Diagnosis not present

## 2021-09-30 DIAGNOSIS — J9602 Acute respiratory failure with hypercapnia: Secondary | ICD-10-CM | POA: Diagnosis not present

## 2021-09-30 LAB — GLUCOSE, CAPILLARY
Glucose-Capillary: 102 mg/dL — ABNORMAL HIGH (ref 70–99)
Glucose-Capillary: 77 mg/dL (ref 70–99)
Glucose-Capillary: 86 mg/dL (ref 70–99)
Glucose-Capillary: 123 mg/dL — ABNORMAL HIGH (ref 70–99)
Glucose-Capillary: 104 mg/dL — ABNORMAL HIGH (ref 70–99)
Glucose-Capillary: 123 mg/dL — ABNORMAL HIGH (ref 70–99)

## 2021-09-30 LAB — BASIC METABOLIC PANEL
Anion gap: 9 (ref 5–15)
BUN: 115 mg/dL — ABNORMAL HIGH (ref 8–23)
CO2: 29 mmol/L (ref 22–32)
Calcium: 8.1 mg/dL — ABNORMAL LOW (ref 8.9–10.3)
Chloride: 107 mmol/L (ref 98–111)
Creatinine, Ser: 3.65 mg/dL — ABNORMAL HIGH (ref 0.61–1.24)
GFR, Estimated: 18 mL/min — ABNORMAL LOW (ref >60)
Glucose, Bld: 101 mg/dL — ABNORMAL HIGH (ref 70–99)
Potassium: 4.4 mmol/L (ref 3.5–5.1)
Sodium: 145 mmol/L (ref 135–145)

## 2021-09-30 MED ORDER — HEPARIN SODIUM (PORCINE) 1000 UNIT/ML IJ SOLN
INTRAMUSCULAR | Status: AC
  Filled 2021-09-30: qty 10

## 2021-09-30 NOTE — Progress Notes (Signed)
[facility]  ROUNDING NOTE   Subjective:   Gary [name] is a 66 year old male with past medical conditions including COPD requiring 2 L nasal cannula, and congestive heart failure.  Patient presents to the emergency department with shortness of breath.  Patient has been admitted for COPD exacerbation ([place]) [J44.1] Obesity hypoventilation syndrome ([place]) [E66.2] Acute on chronic diastolic congestive heart failure ([name]) [I50.33] Acute respiratory failure with hypoxia and hypercapnia ([name]) [J96.01, J96.02] Acute on chronic respiratory failure with hypoxia and hypercapnia ([name]) [N39.76, J96.22]  Patient is known and has recently been followed by Dr. Holley Raring for cute kidney injury, resolved at his May appointment.    Patient remains critically ill Sedated with fentanyl No pressors.  Tachycardic. GI: Tube feeds at 50 cc/h Pulmonary: Ventilator assisted, FiO2 40%   Objective:  Vital signs in last 24 hours:  Temp:  [96.4 F (35.8 C)-98.4 F (36.9 C)] 96.4 F (35.8 C) (06/17 0800) Pulse Rate:  [88-97] 88 (06/17 1000) Resp:  [14-22] 15 (06/17 1000) BP: (98-132)/(60-82) 98/60 (06/17 1000) SpO2:  [83 %-100 %] 95 % (06/17 1000) FiO2 (%):  [40 %-60 %] 40 % (06/17 0734) Weight:  [151.8 kg] 151.8 kg (06/17 0500)  Weight change: -34.4 kg Filed Weights   09/28/21 1414 09/29/21 0406 09/30/21 0500  Weight: (!) 185.1 kg (!) 153.8 kg (!) 151.8 kg    Intake/Output: I/O last 3 completed shifts: In: 1866.2 [I.V.:332.8; NG/GT:1233.3; IV Piggyback:300.1] Out: 4100 [Urine:4100]   Intake/Output this shift:  Total I/O In: 349.5 [I.V.:59.5; NG/GT:290] Out: 610 [Urine:610]  Physical Exam: General: Critically ill-appearing, morbidly obese gentleman, BMI of 42  Head: Normocephalic, atraumatic.   Eyes: Anicteric  Lungs:  Rhonchi throughout, vent and intubated  Heart: Regular rate and rhythm, tachycardic  Abdomen:  Soft, nontender, obese  Extremities: 2+ peripheral edema.   Neurologic: Sedated  Skin: No lesions  Access: Right IJ temp cath    Basic Metabolic Panel: Recent Labs  Lab 09/25/21 0528 09/26/21 7341 09/27/21 0325 09/27/21 1641 09/27/21 2103 09/28/21 0412 09/28/21 0737 09/28/21 1346 09/28/21 1747 09/28/21 2344 09/29/21 0410 09/30/21 0526  NA 142 139 138   < > 139 138 139  --   --   --  139 145  K 4.5 5.7* 5.7*   < > 5.4* 5.7* 5.6* 3.8 4.6 4.3 4.2 4.4  CL 109 105 103   < > 104 102 104  --   --   --  100 107  CO2 24 21* 22   < > '23 22 23  '$ --   --   --  30 29  GLUCOSE 115* 117* 114*   < > 139* 97 95  --   --   --  105* 101*  BUN 90* 163* 119*   < > 131* 140* 145*  --   --   --  96* 115*  CREATININE 2.20* 4.15* 4.72*   < > 5.24* 5.11* 5.28*  --   --   --  3.75* 3.65*  CALCIUM 8.5* 8.1* 7.9*   < > 7.8* 7.8* 7.7*  --   --   --  7.8* 8.1*  MG 2.6* 2.7* 2.3  --   --  2.2  --   --   --   --  1.9  --   PHOS 4.3 7.8* 6.5*  --   --  7.9*  --   --   --   --  6.6*  --    < > = values  in this interval not displayed.     Liver Function Tests: No results for input(s): "AST", "ALT", "ALKPHOS", "BILITOT", "PROT", "ALBUMIN" in the last 168 hours. No results for input(s): "LIPASE", "AMYLASE" in the last 168 hours. No results for input(s): "AMMONIA" in the last 168 hours.  CBC: Recent Labs  Lab 09/24/21 0441 09/25/21 0528 09/26/21 0638 09/27/21 0325 09/28/21 0412  WBC 6.2 10.2 7.8 8.4 9.9  HGB 12.2* 12.9* 13.4 12.2* 11.5*  HCT 42.9 45.5 48.9 43.7 40.8  MCV 79.2* 79.8* 81.8 81.4 80.6  PLT 138* 166 117* 156 155     Cardiac Enzymes: No results for input(s): "CKTOTAL", "CKMB", "CKMBINDEX", "TROPONINI" in the last 168 hours.  BNP: Invalid input(s): "POCBNP"  CBG: Recent Labs  Lab 09/29/21 1552 09/29/21 1936 09/29/21 2351 09/30/21 0348 09/30/21 0726  GLUCAP 131* 122* 108* 102* 50     Microbiology: Results for orders placed or performed during the hospital encounter of 09/19/21  SARS Coronavirus 2 by RT PCR (hospital order,  performed in [facility] hospital lab) *cepheid single result test* Anterior Nasal Swab     Status: None   Collection Time: 09/19/21  9:57 AM   Specimen: Anterior Nasal Swab  Result Value Ref Range Status   SARS Coronavirus 2 by RT PCR NEGATIVE NEGATIVE Final    Comment: (NOTE) SARS-CoV-2 target nucleic acids are NOT DETECTED.  The SARS-CoV-2 RNA is generally detectable in upper and lower respiratory specimens during the acute phase of infection. The lowest concentration of SARS-CoV-2 viral copies this assay can detect is 250 copies / mL. A negative result does not preclude SARS-CoV-2 infection and should not be used as the sole basis for treatment or other patient management decisions.  A negative result may occur with improper specimen collection / handling, submission of specimen other than nasopharyngeal swab, presence of viral mutation(s) within the areas targeted by this assay, and inadequate number of viral copies (<250 copies / mL). A negative result must be combined with clinical observations, patient history, and epidemiological information.  Fact Sheet for Patients:   https://www.patel.info/  Fact Sheet for Healthcare Providers: https://hall.com/  This test is not yet approved or  cleared by the [place] FDA and has been authorized for detection and/or diagnosis of SARS-CoV-2 by FDA under an Emergency Use Authorization (EUA).  This EUA will remain in effect (meaning this test can be used) for the duration of the COVID-19 declaration under Section 564(b)(1) of the Act, 21 U.S.C. section 360bbb-3(b)(1), unless the authorization is terminated or revoked sooner.  Performed at [facility], [address] [place], [place] [address]   Culture, Respiratory w Gram Stain     Status: None   Collection Time: 09/19/21 11:55 AM   Specimen: SPU  Result Value Ref Range Status   Specimen Description   Final     SPUTUM Performed at [facility], [place] [address] [place], [place] [address]    Special Requests   Final    NONE Performed at [facility], [place], [place] [address]    Gram Stain   Final    FEW WBC PRESENT, PREDOMINANTLY PMN FEW GRAM POSITIVE COCCI IN PAIRS Performed at [facility], [place] [address], [place], [place] [address]    Culture RARE [name]  Final   Report Status 09/24/2021 FINAL  Final   Organism ID, Bacteria [name]  Final      Susceptibility   [name] - MIC*  CEFTAZIDIME 4 SENSITIVE Sensitive     CIPROFLOXACIN <=0.25 SENSITIVE Sensitive     GENTAMICIN 2 SENSITIVE Sensitive     IMIPENEM 1 SENSITIVE Sensitive     PIP/TAZO 8 SENSITIVE Sensitive     CEFEPIME 2 SENSITIVE Sensitive     * RARE [name]  MRSA Next Gen by PCR, Nasal     Status: None   Collection Time: 09/19/21  6:10 PM   Specimen: Nasal Mucosa; Nasal Swab  Result Value Ref Range Status   MRSA by PCR Next Gen NOT DETECTED NOT DETECTED Final    Comment: (NOTE) The GeneXpert MRSA Assay (FDA approved for NASAL specimens only), is one component of a comprehensive MRSA colonization surveillance program. It is not intended to diagnose MRSA infection nor to guide or monitor treatment for MRSA infections. Test performance is not FDA approved in patients less than 63 years old. Performed at [facility], [address] [place], [place] [address]   Gastrointestinal Panel by PCR , Stool     Status: Abnormal   Collection Time: 09/21/21 10:53 AM   Specimen: Stool  Result Value Ref Range Status   Campylobacter species NOT DETECTED NOT DETECTED Final   Plesimonas shigelloides NOT DETECTED NOT DETECTED Final   Salmonella species NOT DETECTED NOT DETECTED Final   Yersinia enterocolitica DETECTED (A) NOT DETECTED Final    Comment: RESULT CALLED TO, READ BACK BY AND VERIFIED WITH: [facility] AT 1126  09/22/21.PMF    Vibrio species NOT DETECTED NOT DETECTED Final   Vibrio cholerae NOT DETECTED NOT DETECTED Final   Enteroaggregative E coli (EAEC) NOT DETECTED NOT DETECTED Final   Enteropathogenic E coli (EPEC) NOT DETECTED NOT DETECTED Final   Enterotoxigenic E coli (ETEC) NOT DETECTED NOT DETECTED Final   Shiga like toxin producing E coli (STEC) NOT DETECTED NOT DETECTED Final   Shigella/Enteroinvasive E coli (EIEC) NOT DETECTED NOT DETECTED Final   Cryptosporidium NOT DETECTED NOT DETECTED Final   Cyclospora cayetanensis NOT DETECTED NOT DETECTED Final   Entamoeba histolytica NOT DETECTED NOT DETECTED Final   Giardia lamblia NOT DETECTED NOT DETECTED Final   Adenovirus F40/41 NOT DETECTED NOT DETECTED Final   Astrovirus NOT DETECTED NOT DETECTED Final   Norovirus GI/GII NOT DETECTED NOT DETECTED Final   Rotavirus A NOT DETECTED NOT DETECTED Final   Sapovirus (I, II, IV, and V) NOT DETECTED NOT DETECTED Final    Comment: Performed at [facility], [address] [place], [place] [address]  C Difficile Quick Screen w PCR reflex     Status: None   Collection Time: 09/21/21 11:30 AM   Specimen: STOOL  Result Value Ref Range Status   C Diff antigen NEGATIVE NEGATIVE Final   C Diff toxin NEGATIVE NEGATIVE Final   C Diff interpretation No C. difficile detected.  Final    Comment: Performed at [facility], [address] [place], [place] [address]  Culture, blood (Routine X 2) w Reflex to ID Panel     Status: None   Collection Time: 09/21/21 12:28 PM   Specimen: BLOOD  Result Value Ref Range Status   Specimen Description BLOOD BLOOD RIGHT HAND  Final   Special Requests   Final    BOTTLES DRAWN AEROBIC AND ANAEROBIC Blood Culture adequate volume   Culture   Final    NO GROWTH 5 DAYS Performed at [facility], [address], [place], [place] [address]    Report Status 09/26/2021 FINAL  Final  Culture,  blood (Routine X 2) w Reflex to ID Panel      Status: None   Collection Time: 09/21/21  2:08 PM   Specimen: BLOOD  Result Value Ref Range Status   Specimen Description BLOOD BLOOD LEFT HAND  Final   Special Requests   Final    BOTTLES DRAWN AEROBIC AND ANAEROBIC Blood Culture adequate volume   Culture   Final    NO GROWTH 5 DAYS Performed at [facility], [address] [place], [place] [address]    Report Status 09/26/2021 FINAL  Final  Respiratory (~20 pathogens) panel by PCR     Status: None   Collection Time: 09/21/21  6:14 PM   Specimen: Nasopharyngeal Swab; Respiratory  Result Value Ref Range Status   Adenovirus NOT DETECTED NOT DETECTED Final   Coronavirus 229E NOT DETECTED NOT DETECTED Final    Comment: (NOTE) The Coronavirus on the Respiratory Panel, DOES NOT test for the novel  Coronavirus (2019 nCoV)    Coronavirus HKU1 NOT DETECTED NOT DETECTED Final   Coronavirus NL63 NOT DETECTED NOT DETECTED Final   Coronavirus OC43 NOT DETECTED NOT DETECTED Final   Metapneumovirus NOT DETECTED NOT DETECTED Final   Rhinovirus / Enterovirus NOT DETECTED NOT DETECTED Final   Influenza A NOT DETECTED NOT DETECTED Final   Influenza B NOT DETECTED NOT DETECTED Final   Parainfluenza Virus 1 NOT DETECTED NOT DETECTED Final   Parainfluenza Virus 2 NOT DETECTED NOT DETECTED Final   Parainfluenza Virus 3 NOT DETECTED NOT DETECTED Final   Parainfluenza Virus 4 NOT DETECTED NOT DETECTED Final   Respiratory Syncytial Virus NOT DETECTED NOT DETECTED Final   Bordetella pertussis NOT DETECTED NOT DETECTED Final   Bordetella Parapertussis NOT DETECTED NOT DETECTED Final   Chlamydophila pneumoniae NOT DETECTED NOT DETECTED Final   Mycoplasma pneumoniae NOT DETECTED NOT DETECTED Final    Comment: Performed at [facility], [place] [address] [place], [place] [address]    Coagulation Studies: No results for input(s): "LABPROT", "INR" in the last 72 hours.   Urinalysis: No results for input(s): "COLORURINE", "LABSPEC",  "PHURINE", "GLUCOSEU", "HGBUR", "BILIRUBINUR", "KETONESUR", "PROTEINUR", "UROBILINOGEN", "NITRITE", "LEUKOCYTESUR" in the last 72 hours.  Invalid input(s): "APPERANCEUR"    Imaging: No results found.   Medications:    sodium chloride 10 mL/hr at 09/29/21 1100   ceFEPime (MAXIPIME) IV 2 g (09/30/21 1033)   feeding supplement (NEPRO CARB STEADY) 50 mL/hr at 09/30/21 1000   fentaNYL infusion INTRAVENOUS 100 mcg/hr (09/30/21 1000)    budesonide (PULMICORT) nebulizer solution  0.5 mg Nebulization BID   Chlorhexidine Gluconate Cloth  6 each Topical Daily   diazepam  2.5 mg Per Tube Q8H   feeding supplement (PROSource TF)  90 mL Per Tube TID   free water  30 mL Per Tube Q4H   heparin injection (subcutaneous)  5,000 Units Subcutaneous Q8H   insulin aspart  0-15 Units Subcutaneous Q4H   ipratropium-albuterol  3 mL Nebulization Q6H   multivitamin  1 tablet Per Tube QHS   nutrition supplement (JUVEN)  1 packet Per Tube BID BM   mouth rinse  15 mL Mouth Rinse Q2H   oxyCODONE  10 mg Per Tube Q8H   pantoprazole sodium  40 mg Per Tube Daily   polyethylene glycol  17 g Per Tube Daily   QUEtiapine  50 mg Per Tube QHS   acetaminophen, acetaminophen, albuterol, alteplase, fentaNYL, guaiFENesin-dextromethorphan, heparin, hydrALAZINE, lidocaine (PF), lidocaine-prilocaine, midazolam, mouth rinse, pentafluoroprop-tetrafluoroeth  Assessment/ Plan:  Mr.  Gary [name] is a 66 y.o.  male with past medical conditions including COPD requiring 2 L nasal cannula, and congestive heart failure.  Patient presents to the emergency department with shortness of breath.  Patient has been admitted for COPD exacerbation ([place]) [J44.1] Obesity hypoventilation syndrome ([place]) [E66.2] Acute on chronic diastolic congestive heart failure ([name]) [I50.33] Acute respiratory failure with hypoxia and hypercapnia ([name]) [J96.01, J96.02] Acute on chronic respiratory failure with hypoxia and hypercapnia ([name]) [H68.08,  J96.22]    Acute Kidney Injury with hyperkalemia, cause unidentified.  No IV contrast exposure and brief hypotensive event. Normal renal function 2 days prior.Renal Ultrasound shows mild right obstruction and simple cyst in  left kidney.   -Serum creatinine trends as noted below.  Urine output of 2950 cc last 24 hours -BUN critically elevated at 115 today. -Will continue dialysis to optimize metabolic status.  -Assess daily for need for further hemodialysis  Lab Results  Component Value Date   CREATININE 3.65 (H) 09/30/2021   CREATININE 3.75 (H) 09/29/2021   CREATININE 5.28 (H) 09/28/2021    Intake/Output Summary (Last 24 hours) at 09/30/2021 1102 Last data filed at 09/30/2021 1000 Gross per 24 hour  Intake 1138.51 ml  Output 3110 ml  Net -1971.49 ml    Acute respiratory failure Ventilator assisted FiO2 40% Receiving IV cefepime Pseudomonas noted in sputum on September 19, 8108  Chronic diastolic heart failure.  Echo from 08/07/2021 shows EF 60 to 65% with a mildly dilated left ventricular cavity and grade 1 diastolic dysfunction. Home regimen of aspirin, carvedilol, Farxiga, simvastatin, torsemide All on hold at present.    LOS: [place] 6/17/202311:02 AM

## 2021-09-30 NOTE — Progress Notes (Signed)
PHARMACY CONSULT NOTE  Pharmacy Consult for Electrolyte Monitoring and Replacement   Recent Labs: Potassium (mmol/L)  Date Value  09/30/2021 4.4  04/15/2014 3.9   Magnesium (mg/dL)  Date Value  09/29/2021 1.9  08/21/2012 1.9   Calcium (mg/dL)  Date Value  09/30/2021 8.1 (L)   Calcium, Total (mg/dL)  Date Value  04/15/2014 8.3 (L)   Albumin (g/dL)  Date Value  09/19/2021 3.1 (L)  04/15/2014 2.9 (L)   Phosphorus (mg/dL)  Date Value  09/29/2021 6.6 (H)   Sodium (mmol/L)  Date Value  09/30/2021 145  03/15/2020 145 (H)  04/15/2014 140   Assessment: 66 y/o male with h/o PUD, OSA, HTN, CKD III, substance abuse, CHF, Hep C and COPD and COVID 19 (02/2021) who is admitted with COPD exacerbation, CHF and AKI. Pharmacy is asked to follow and replace electrolytes while in CCU.  Acute renal failure; nephrology consulted. First HD 6/13. Renal US from 6/13 showed significant amount of urine in bladder and no foley in place. Foley exchanged; 4450 mL UOP 6/14, 2245 UOP 6/15  Goal of Therapy:  Electrolytes within normal limits  Plan:  --no replacement required at this time.  --Phos elevated defer management to PCCM / nephrology --Follow-up electrolytes with AM labs tomorrow  Gary [name] 09/30/2021 8:40 AM

## 2021-09-30 NOTE — Plan of Care (Signed)
  Problem: Education: Goal: Ability to demonstrate management of disease process will improve Outcome: Not Progressing Goal: Ability to verbalize understanding of medication therapies will improve Outcome: Not Progressing Goal: Individualized Educational Video(s) Outcome: Not Progressing   Problem: Activity: Goal: Capacity to carry out activities will improve Outcome: Not Progressing   Problem: Cardiac: Goal: Ability to achieve and maintain adequate cardiopulmonary perfusion will improve Outcome: Not Progressing   Problem: Education: Goal: Knowledge of disease or condition will improve Outcome: Not Progressing Goal: Knowledge of the prescribed therapeutic regimen will improve Outcome: Not Progressing Goal: Individualized Educational Video(s) Outcome: Not Progressing   Problem: Activity: Goal: Ability to tolerate increased activity will improve Outcome: Not Progressing Goal: Will verbalize the importance of balancing activity with adequate rest periods Outcome: Not Progressing   Problem: Respiratory: Goal: Ability to maintain a clear airway will improve Outcome: Not Progressing Goal: Levels of oxygenation will improve Outcome: Not Progressing Goal: Ability to maintain adequate ventilation will improve Outcome: Not Progressing   Problem: Education: Goal: Knowledge of General Education information will improve Description: Including pain rating scale, medication(s)/side effects and non-pharmacologic comfort measures Outcome: Not Progressing   Problem: Health Behavior/Discharge Planning: Goal: Ability to manage health-related needs will improve Outcome: Not Progressing   Problem: Clinical Measurements: Goal: Ability to maintain clinical measurements within normal limits will improve Outcome: Not Progressing Goal: Will remain free from infection Outcome: Not Progressing Goal: Diagnostic test results will improve Outcome: Not Progressing Goal: Respiratory complications  will improve Outcome: Not Progressing Goal: Cardiovascular complication will be avoided Outcome: Not Progressing   Problem: Activity: Goal: Risk for activity intolerance will decrease Outcome: Not Progressing   Problem: Nutrition: Goal: Adequate nutrition will be maintained Outcome: Not Progressing   Problem: Coping: Goal: Level of anxiety will decrease Outcome: Not Progressing   Problem: Elimination: Goal: Will not experience complications related to bowel motility Outcome: Not Progressing Goal: Will not experience complications related to urinary retention Outcome: Not Progressing   Problem: Pain Managment: Goal: General experience of comfort will improve Outcome: Not Progressing   Problem: Safety: Goal: Ability to remain free from injury will improve Outcome: Not Progressing   Problem: Skin Integrity: Goal: Risk for impaired skin integrity will decrease Outcome: Not Progressing   Problem: Education: Goal: Ability to describe self-care measures that may prevent or decrease complications (Diabetes Survival Skills Education) will improve Outcome: Not Progressing Goal: Individualized Educational Video(s) Outcome: Not Progressing   Problem: Coping: Goal: Ability to adjust to condition or change in health will improve Outcome: Not Progressing   Problem: Fluid Volume: Goal: Ability to maintain a balanced intake and output will improve Outcome: Not Progressing   Problem: Health Behavior/Discharge Planning: Goal: Ability to identify and utilize available resources and services will improve Outcome: Not Progressing Goal: Ability to manage health-related needs will improve Outcome: Not Progressing   Problem: Metabolic: Goal: Ability to maintain appropriate glucose levels will improve Outcome: Not Progressing   Problem: Nutritional: Goal: Maintenance of adequate nutrition will improve Outcome: Not Progressing Goal: Progress toward achieving an optimal weight will  improve Outcome: Not Progressing   Problem: Skin Integrity: Goal: Risk for impaired skin integrity will decrease Outcome: Not Progressing   Problem: Tissue Perfusion: Goal: Adequacy of tissue perfusion will improve Outcome: Not Progressing
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
## 2021-10-01 DIAGNOSIS — J9621 Acute and chronic respiratory failure with hypoxia: Secondary | ICD-10-CM | POA: Diagnosis not present

## 2021-10-01 DIAGNOSIS — J9601 Acute respiratory failure with hypoxia: Secondary | ICD-10-CM | POA: Diagnosis not present

## 2021-10-01 DIAGNOSIS — G9341 Metabolic encephalopathy: Secondary | ICD-10-CM | POA: Diagnosis not present

## 2021-10-01 DIAGNOSIS — I5033 Acute on chronic diastolic (congestive) heart failure: Secondary | ICD-10-CM | POA: Diagnosis not present

## 2021-10-01 LAB — BASIC METABOLIC PANEL
Anion gap: 6 (ref 5–15)
Anion gap: 8 (ref 5–15)
BUN: 78 mg/dL — ABNORMAL HIGH (ref 8–23)
BUN: 95 mg/dL — ABNORMAL HIGH (ref 8–23)
CO2: 31 mmol/L (ref 22–32)
CO2: 28 mmol/L (ref 22–32)
Calcium: 8 mg/dL — ABNORMAL LOW (ref 8.9–10.3)
Calcium: 8.3 mg/dL — ABNORMAL LOW (ref 8.9–10.3)
Chloride: 102 mmol/L (ref 98–111)
Chloride: 102 mmol/L (ref 98–111)
Creatinine, Ser: 2.37 mg/dL — ABNORMAL HIGH (ref 0.61–1.24)
Creatinine, Ser: 2.77 mg/dL — ABNORMAL HIGH (ref 0.61–1.24)
GFR, Estimated: 30 mL/min — ABNORMAL LOW (ref >60)
GFR, Estimated: 25 mL/min — ABNORMAL LOW (ref >60)
Glucose, Bld: 97 mg/dL (ref 70–99)
Glucose, Bld: 132 mg/dL — ABNORMAL HIGH (ref 70–99)
Potassium: 3.8 mmol/L (ref 3.5–5.1)
Potassium: 4 mmol/L (ref 3.5–5.1)
Sodium: 139 mmol/L (ref 135–145)
Sodium: 138 mmol/L (ref 135–145)

## 2021-10-01 LAB — GLUCOSE, CAPILLARY
Glucose-Capillary: 97 mg/dL (ref 70–99)
Glucose-Capillary: 76 mg/dL (ref 70–99)
Glucose-Capillary: 121 mg/dL — ABNORMAL HIGH (ref 70–99)
Glucose-Capillary: 131 mg/dL — ABNORMAL HIGH (ref 70–99)
Glucose-Capillary: 132 mg/dL — ABNORMAL HIGH (ref 70–99)

## 2021-10-01 LAB — CBC
HCT: 37.3 % — ABNORMAL LOW (ref 39.0–52.0)
Hemoglobin: 10.5 g/dL — ABNORMAL LOW (ref 13.0–17.0)
MCH: 23 pg — ABNORMAL LOW (ref 26.0–34.0)
MCHC: 28.2 g/dL — ABNORMAL LOW (ref 30.0–36.0)
MCV: 81.8 fL (ref 80.0–100.0)
Platelets: 164 10*3/uL (ref 150–400)
RBC: 4.56 MIL/uL (ref 4.22–5.81)
RDW: 16.9 % — ABNORMAL HIGH (ref 11.5–15.5)
WBC: 12.5 10*3/uL — ABNORMAL HIGH (ref 4.0–10.5)
nRBC: 0 % (ref 0.0–0.2)

## 2021-10-01 LAB — MAGNESIUM
Magnesium: 1.7 mg/dL (ref 1.7–2.4)
Magnesium: 2.3 mg/dL (ref 1.7–2.4)

## 2021-10-01 LAB — PHOSPHORUS: Phosphorus: 5.1 mg/dL — ABNORMAL HIGH (ref 2.5–4.6)

## 2021-10-01 MED ORDER — IPRATROPIUM-ALBUTEROL 0.5-2.5 (3) MG/3ML IN SOLN
3.0000 mL | Freq: Two times a day (BID) | RESPIRATORY_TRACT | Status: DC
  Administered 2021-10-01 – 2021-10-06 (×10): 3 mL via RESPIRATORY_TRACT
  Filled 2021-10-01 (×10): qty 3

## 2021-10-01 MED ORDER — FENTANYL 2500MCG IN NS 250ML (10MCG/ML) PREMIX INFUSION
50.0000 ug/h | INTRAVENOUS | Status: DC
  Administered 2021-10-01 – 2021-10-02 (×4): 200 ug/h via INTRAVENOUS
  Administered 2021-10-03: 50 ug/h via INTRAVENOUS
  Administered 2021-10-03: 200 ug/h via INTRAVENOUS
  Administered 2021-10-04: 175 ug/h via INTRAVENOUS
  Administered 2021-10-06: 75 ug/h via INTRAVENOUS
  Administered 2021-10-06: 150 ug/h via INTRAVENOUS
  Administered 2021-10-06: 125 ug/h via INTRAVENOUS
  Administered 2021-10-08 – 2021-10-09 (×2): 50 ug/h via INTRAVENOUS
  Administered 2021-10-10 – 2021-10-11 (×4): 150 ug/h via INTRAVENOUS
  Administered 2021-10-12 – 2021-10-13 (×3): 200 ug/h via INTRAVENOUS
  Filled 2021-10-01 (×18): qty 250

## 2021-10-01 MED ORDER — MAGNESIUM SULFATE 2 GM/50ML IV SOLN
2.0000 g | Freq: Once | INTRAVENOUS | Status: AC
  Administered 2021-10-01: 2 g via INTRAVENOUS
  Filled 2021-10-01: qty 50

## 2021-10-01 MED ORDER — DIAZEPAM 5 MG PO TABS: 5.0000 mg | ORAL_TABLET | Freq: Three times a day (TID) | ORAL | Status: DC

## 2021-10-01 MED ORDER — MIDAZOLAM HCL 2 MG/2ML IJ SOLN
2.0000 mg | Freq: Once | INTRAMUSCULAR | Status: AC
  Administered 2021-10-01: 2 mg via INTRAVENOUS

## 2021-10-01 MED ORDER — QUETIAPINE FUMARATE 25 MG PO TABS
100.0000 mg | ORAL_TABLET | Freq: Every day | ORAL | Status: DC
Start: 2021-10-01 — End: 2021-10-02
  Administered 2021-10-01: 100 mg
  Filled 2021-10-01: qty 4

## 2021-10-01 MED ORDER — FENTANYL BOLUS VIA INFUSION: 50.0000 ug | INTRAVENOUS | Status: DC | PRN

## 2021-10-01 MED ORDER — FENTANYL CITRATE PF 50 MCG/ML IJ SOSY: 50.0000 ug | PREFILLED_SYRINGE | Freq: Once | INTRAMUSCULAR | Status: DC

## 2021-10-01 MED ORDER — CLONAZEPAM 0.125 MG PO TBDP
0.2500 mg | ORAL_TABLET | Freq: Two times a day (BID) | ORAL | Status: DC
Start: 2021-10-01 — End: 2021-10-02
  Administered 2021-10-01 – 2021-10-02 (×2): 0.25 mg
  Filled 2021-10-01 (×2): qty 2

## 2021-10-01 MED ORDER — DEXMEDETOMIDINE HCL IN NACL 400 MCG/100ML IV SOLN
0.4000 ug/kg/h | INTRAVENOUS | Status: DC
  Administered 2021-10-01 (×2): 0.4 ug/kg/h via INTRAVENOUS
  Filled 2021-10-01 (×2): qty 100

## 2021-10-01 NOTE — Plan of Care (Signed)
  Problem: Education: Goal: Ability to demonstrate management of disease process will improve Outcome: Not Progressing Goal: Ability to verbalize understanding of medication therapies will improve Outcome: Not Progressing Goal: Individualized Educational Video(s) Outcome: Not Progressing   Problem: Activity: Goal: Capacity to carry out activities will improve Outcome: Not Progressing   Problem: Cardiac: Goal: Ability to achieve and maintain adequate cardiopulmonary perfusion will improve Outcome: Not Progressing   Problem: Education: Goal: Knowledge of disease or condition will improve Outcome: Not Progressing Goal: Knowledge of the prescribed therapeutic regimen will improve Outcome: Not Progressing Goal: Individualized Educational Video(s) Outcome: Not Progressing   Problem: Activity: Goal: Ability to tolerate increased activity will improve Outcome: Not Progressing Goal: Will verbalize the importance of balancing activity with adequate rest periods Outcome: Not Progressing   Problem: Respiratory: Goal: Ability to maintain a clear airway will improve Outcome: Not Progressing Goal: Levels of oxygenation will improve Outcome: Not Progressing Goal: Ability to maintain adequate ventilation will improve Outcome: Not Progressing   Problem: Education: Goal: Knowledge of General Education information will improve Description: Including pain rating scale, medication(s)/side effects and non-pharmacologic comfort measures Outcome: Not Progressing   Problem: Health Behavior/Discharge Planning: Goal: Ability to manage health-related needs will improve Outcome: Not Progressing   Problem: Clinical Measurements: Goal: Ability to maintain clinical measurements within normal limits will improve Outcome: Not Progressing Goal: Will remain free from infection Outcome: Not Progressing Goal: Diagnostic test results will improve Outcome: Not Progressing Goal: Respiratory complications  will improve Outcome: Not Progressing Goal: Cardiovascular complication will be avoided Outcome: Not Progressing   Problem: Activity: Goal: Risk for activity intolerance will decrease Outcome: Not Progressing   Problem: Nutrition: Goal: Adequate nutrition will be maintained Outcome: Not Progressing   Problem: Coping: Goal: Level of anxiety will decrease Outcome: Not Progressing   Problem: Elimination: Goal: Will not experience complications related to bowel motility Outcome: Not Progressing Goal: Will not experience complications related to urinary retention Outcome: Not Progressing   Problem: Pain Managment: Goal: General experience of comfort will improve Outcome: Not Progressing   Problem: Safety: Goal: Ability to remain free from injury will improve Outcome: Not Progressing   Problem: Skin Integrity: Goal: Risk for impaired skin integrity will decrease Outcome: Not Progressing   Problem: Education: Goal: Ability to describe self-care measures that may prevent or decrease complications (Diabetes Survival Skills Education) will improve Outcome: Not Progressing Goal: Individualized Educational Video(s) Outcome: Not Progressing   Problem: Coping: Goal: Ability to adjust to condition or change in health will improve Outcome: Not Progressing   Problem: Fluid Volume: Goal: Ability to maintain a balanced intake and output will improve Outcome: Not Progressing   Problem: Health Behavior/Discharge Planning: Goal: Ability to identify and utilize available resources and services will improve Outcome: Not Progressing Goal: Ability to manage health-related needs will improve Outcome: Not Progressing   Problem: Metabolic: Goal: Ability to maintain appropriate glucose levels will improve Outcome: Not Progressing   Problem: Nutritional: Goal: Maintenance of adequate nutrition will improve Outcome: Not Progressing Goal: Progress toward achieving an optimal weight will  improve Outcome: Not Progressing   Problem: Skin Integrity: Goal: Risk for impaired skin integrity will decrease Outcome: Not Progressing   Problem: Tissue Perfusion: Goal: Adequacy of tissue perfusion will improve Outcome: Not Progressing

## 2021-10-01 NOTE — Progress Notes (Signed)
[place] for Electrolyte Monitoring and Replacement   Recent Labs: Potassium (mmol/L)  Date Value  10/01/2021 3.8  04/15/2014 3.9   Magnesium (mg/dL)  Date Value  10/01/2021 1.7  08/21/2012 1.9   Calcium (mg/dL)  Date Value  10/01/2021 8.0 (L)   Calcium, Total (mg/dL)  Date Value  04/15/2014 8.3 (L)   Albumin (g/dL)  Date Value  09/19/2021 3.1 (L)  04/15/2014 2.9 (L)   Phosphorus (mg/dL)  Date Value  10/01/2021 5.1 (H)   Sodium (mmol/L)  Date Value  10/01/2021 139  03/15/2020 145 (H)  04/15/2014 140   Assessment: 66 y/o male with h/o PUD, OSA, HTN, CKD III, substance abuse, CHF, Hep C and COPD and COVID 19 (02/2021) who is admitted with COPD exacerbation, CHF and AKI. Pharmacy is asked to follow and replace electrolytes while in CCU.  Acute renal failure; nephrology consulted. First HD 6/13. Renal US from 6/13 showed significant amount of urine in bladder and no foley in place. Foley exchanged; 4450 mL UOP 6/14, 2245 UOP 6/15  Goal of Therapy:  Electrolytes within normal limits  Plan:  --Mg 2 g IV x 1 --Phos elevated defer management to PCCM / nephrology --Follow-up electrolytes with AM labs tomorrow  Oswald Hillock 10/01/2021 8:12 AM
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
## 2021-10-01 NOTE — Progress Notes (Signed)
[facility]  ROUNDING NOTE   Subjective:   Gary [name] is a 66 year old male with past medical conditions including COPD requiring 2 L nasal cannula, and congestive heart failure.  Patient presents to the emergency department with shortness of breath.  Patient has been admitted for COPD exacerbation ([place]) [J44.1] Obesity hypoventilation syndrome ([place]) [E66.2] Acute on chronic diastolic congestive heart failure ([name]) [I50.33] Acute respiratory failure with hypoxia and hypercapnia ([name]) [J96.01, J96.02] Acute on chronic respiratory failure with hypoxia and hypercapnia ([place]) [C12.75, J96.22]  Patient is known and has recently been followed by Dr. Holley Raring   Patient remains critically ill Sedated with fentanyl No pressors.  Tachycardic. GI: Tube feeds at 60 cc/h Pulmonary: Ventilator assisted, FiO2 40%, PEEP 8 Agitated this morning but seems to be following some basic commands.   Objective:  Vital signs in last 24 hours:  Temp:  [97.2 F (36.2 C)-98.5 F (36.9 C)] 97.9 F (36.6 C) (06/18 0800) Pulse Rate:  [86-102] 91 (06/18 0800) Resp:  [14-26] 23 (06/18 0800) BP: (93-118)/(55-77) 109/73 (06/18 0800) SpO2:  [92 %-96 %] 95 % (06/18 0800) FiO2 (%):  [40 %] 40 % (06/18 0400) Weight:  [150.6 kg] 150.6 kg (06/18 0600)  Weight change: -1.2 kg Filed Weights   09/30/21 0500 10/01/21 0600  Weight: (!) 151.8 kg (!) 150.6 kg    Intake/Output: I/O last 3 completed shifts: In: 2702.8 [I.V.:475.5; NG/GT:1927.3; IV Piggyback:300] Out: 1700 [Urine:3440]   Intake/Output this shift:  Total I/O In: 70 [I.V.:10; NG/GT:60] Out: 135 [Urine:135]  Physical Exam: General: Critically ill-appearing, morbidly obese gentleman, BMI of 42  Head: Normocephalic, atraumatic.   Eyes: Anicteric  Lungs:  Rhonchi throughout, vent and intubated  Heart: Irregular rhythm, tachycardic  Abdomen:  Soft, nontender, obese  Extremities: 2+ peripheral edema.  Neurologic: Sedated  Skin: No  lesions  Access: Right IJ temp cath    Basic Metabolic Panel: Recent Labs  Lab 09/26/21 0638 09/27/21 0325 09/27/21 1641 09/28/21 0412 09/28/21 0737 09/28/21 1346 09/28/21 1747 09/28/21 2344 09/29/21 0410 09/30/21 0526 10/01/21 0526  NA 139 138   < > 138 139  --   --   --  139 145 139  K 5.7* 5.7*   < > 5.7* 5.6*   < > 4.6 4.3 4.2 4.4 3.8  CL 105 103   < > 102 104  --   --   --  100 107 102  CO2 21* 22   < > 22 23  --   --   --  '30 29 31  '$ GLUCOSE 117* 114*   < > 97 95  --   --   --  105* 101* 97  BUN 163* 119*   < > 140* 145*  --   --   --  96* 115* 78*  CREATININE 4.15* 4.72*   < > 5.11* 5.28*  --   --   --  3.75* 3.65* 2.37*  CALCIUM 8.1* 7.9*   < > 7.8* 7.7*  --   --   --  7.8* 8.1* 8.0*  MG 2.7* 2.3  --  2.2  --   --   --   --  1.9  --  1.7  PHOS 7.8* 6.5*  --  7.9*  --   --   --   --  6.6*  --  5.1*   < > = values in this interval not displayed.     Liver Function Tests: No results for input(s): "AST", "ALT", "  ALKPHOS", "BILITOT", "PROT", "ALBUMIN" in the last 168 hours. No results for input(s): "LIPASE", "AMYLASE" in the last 168 hours. No results for input(s): "AMMONIA" in the last 168 hours.  CBC: Recent Labs  Lab 09/25/21 0528 09/26/21 8299 09/27/21 0325 09/28/21 0412 10/01/21 0526  WBC 10.2 7.8 8.4 9.9 12.5*  HGB 12.9* 13.4 12.2* 11.5* 10.5*  HCT 45.5 48.9 43.7 40.8 37.3*  MCV 79.8* 81.8 81.4 80.6 81.8  PLT 166 117* 156 155 164     Cardiac Enzymes: No results for input(s): "CKTOTAL", "CKMB", "CKMBINDEX", "TROPONINI" in the last 168 hours.  BNP: Invalid input(s): "POCBNP"  CBG: Recent Labs  Lab 09/30/21 1551 09/30/21 1934 09/30/21 2310 10/01/21 0352 10/01/21 0734  GLUCAP 123* 104* 123* 97 26     Microbiology: Results for orders placed or performed during the hospital encounter of 09/19/21  SARS Coronavirus 2 by RT PCR (hospital order, performed in [facility] hospital lab) *cepheid single result test* Anterior Nasal Swab     Status:  None   Collection Time: 09/19/21  9:57 AM   Specimen: Anterior Nasal Swab  Result Value Ref Range Status   SARS Coronavirus 2 by RT PCR NEGATIVE NEGATIVE Final    Comment: (NOTE) SARS-CoV-2 target nucleic acids are NOT DETECTED.  The SARS-CoV-2 RNA is generally detectable in upper and lower respiratory specimens during the acute phase of infection. The lowest concentration of SARS-CoV-2 viral copies this assay can detect is 250 copies / mL. A negative result does not preclude SARS-CoV-2 infection and should not be used as the sole basis for treatment or other patient management decisions.  A negative result may occur with improper specimen collection / handling, submission of specimen other than nasopharyngeal swab, presence of viral mutation(s) within the areas targeted by this assay, and inadequate number of viral copies (<250 copies / mL). A negative result must be combined with clinical observations, patient history, and epidemiological information.  Fact Sheet for Patients:   https://www.patel.info/  Fact Sheet for Healthcare Providers: https://hall.com/  This test is not yet approved or  cleared by the [place] FDA and has been authorized for detection and/or diagnosis of SARS-CoV-2 by FDA under an Emergency Use Authorization (EUA).  This EUA will remain in effect (meaning this test can be used) for the duration of the COVID-19 declaration under Section 564(b)(1) of the Act, 21 U.S.C. section 360bbb-3(b)(1), unless the authorization is terminated or revoked sooner.  Performed at [facility], [address] [place], [place] [address]   Culture, Respiratory w Gram Stain     Status: None   Collection Time: 09/19/21 11:55 AM   Specimen: SPU  Result Value Ref Range Status   Specimen Description   Final    SPUTUM Performed at [facility], [place] [address] [place], [place] [address]    Special Requests    Final    NONE Performed at [facility], [place], [place] [address]    Gram Stain   Final    FEW WBC PRESENT, PREDOMINANTLY PMN FEW GRAM POSITIVE COCCI IN PAIRS Performed at [facility], [place] [address], [place], [place] [address]    Culture RARE [name]  Final   Report Status 09/24/2021 FINAL  Final   Organism ID, Bacteria [name]  Final      Susceptibility   [name] - MIC*    CEFTAZIDIME 4 SENSITIVE Sensitive     CIPROFLOXACIN <=0.25 SENSITIVE Sensitive  GENTAMICIN 2 SENSITIVE Sensitive     IMIPENEM 1 SENSITIVE Sensitive     PIP/TAZO 8 SENSITIVE Sensitive     CEFEPIME 2 SENSITIVE Sensitive     * RARE [name]  MRSA Next Gen by PCR, Nasal     Status: None   Collection Time: 09/19/21  6:10 PM   Specimen: Nasal Mucosa; Nasal Swab  Result Value Ref Range Status   MRSA by PCR Next Gen NOT DETECTED NOT DETECTED Final    Comment: (NOTE) The GeneXpert MRSA Assay (FDA approved for NASAL specimens only), is one component of a comprehensive MRSA colonization surveillance program. It is not intended to diagnose MRSA infection nor to guide or monitor treatment for MRSA infections. Test performance is not FDA approved in patients less than 46 years old. Performed at [facility], [address] [place], [place] [address]   Gastrointestinal Panel by PCR , Stool     Status: Abnormal   Collection Time: 09/21/21 10:53 AM   Specimen: Stool  Result Value Ref Range Status   Campylobacter species NOT DETECTED NOT DETECTED Final   Plesimonas shigelloides NOT DETECTED NOT DETECTED Final   Salmonella species NOT DETECTED NOT DETECTED Final   Yersinia enterocolitica DETECTED (A) NOT DETECTED Final    Comment: RESULT CALLED TO, READ BACK BY AND VERIFIED WITH: [facility] JACKSON AT 1126 09/22/21.PMF    Vibrio species NOT DETECTED NOT DETECTED Final   Vibrio cholerae NOT DETECTED NOT DETECTED Final    Enteroaggregative E coli (EAEC) NOT DETECTED NOT DETECTED Final   Enteropathogenic E coli (EPEC) NOT DETECTED NOT DETECTED Final   Enterotoxigenic E coli (ETEC) NOT DETECTED NOT DETECTED Final   Shiga like toxin producing E coli (STEC) NOT DETECTED NOT DETECTED Final   Shigella/Enteroinvasive E coli (EIEC) NOT DETECTED NOT DETECTED Final   Cryptosporidium NOT DETECTED NOT DETECTED Final   Cyclospora cayetanensis NOT DETECTED NOT DETECTED Final   Entamoeba histolytica NOT DETECTED NOT DETECTED Final   Giardia lamblia NOT DETECTED NOT DETECTED Final   Adenovirus F40/41 NOT DETECTED NOT DETECTED Final   Astrovirus NOT DETECTED NOT DETECTED Final   Norovirus GI/GII NOT DETECTED NOT DETECTED Final   Rotavirus A NOT DETECTED NOT DETECTED Final   Sapovirus (I, II, IV, and V) NOT DETECTED NOT DETECTED Final    Comment: Performed at [facility], [address] [place], [place] [address]  C Difficile Quick Screen w PCR reflex     Status: None   Collection Time: 09/21/21 11:30 AM   Specimen: STOOL  Result Value Ref Range Status   C Diff antigen NEGATIVE NEGATIVE Final   C Diff toxin NEGATIVE NEGATIVE Final   C Diff interpretation No C. difficile detected.  Final    Comment: Performed at [facility], [address] [place], [place] [address]  Culture, blood (Routine X 2) w Reflex to ID Panel     Status: None   Collection Time: 09/21/21 12:28 PM   Specimen: BLOOD  Result Value Ref Range Status   Specimen Description BLOOD BLOOD RIGHT HAND  Final   Special Requests   Final    BOTTLES DRAWN AEROBIC AND ANAEROBIC Blood Culture adequate volume   Culture   Final    NO GROWTH 5 DAYS Performed at [facility], [address], [place], [place] [address]    Report Status 09/26/2021 FINAL  Final  Culture, blood (Routine X 2) w Reflex to ID Panel     Status: None  Collection Time: 09/21/21  2:08 PM   Specimen: BLOOD  Result Value Ref Range Status   Specimen  Description BLOOD BLOOD LEFT HAND  Final   Special Requests   Final    BOTTLES DRAWN AEROBIC AND ANAEROBIC Blood Culture adequate volume   Culture   Final    NO GROWTH 5 DAYS Performed at [facility], [address] [place], [place] [address]    Report Status 09/26/2021 FINAL  Final  Respiratory (~20 pathogens) panel by PCR     Status: None   Collection Time: 09/21/21  6:14 PM   Specimen: Nasopharyngeal Swab; Respiratory  Result Value Ref Range Status   Adenovirus NOT DETECTED NOT DETECTED Final   Coronavirus 229E NOT DETECTED NOT DETECTED Final    Comment: (NOTE) The Coronavirus on the Respiratory Panel, DOES NOT test for the novel  Coronavirus (2019 nCoV)    Coronavirus HKU1 NOT DETECTED NOT DETECTED Final   Coronavirus NL63 NOT DETECTED NOT DETECTED Final   Coronavirus OC43 NOT DETECTED NOT DETECTED Final   Metapneumovirus NOT DETECTED NOT DETECTED Final   Rhinovirus / Enterovirus NOT DETECTED NOT DETECTED Final   Influenza A NOT DETECTED NOT DETECTED Final   Influenza B NOT DETECTED NOT DETECTED Final   Parainfluenza Virus 1 NOT DETECTED NOT DETECTED Final   Parainfluenza Virus 2 NOT DETECTED NOT DETECTED Final   Parainfluenza Virus 3 NOT DETECTED NOT DETECTED Final   Parainfluenza Virus 4 NOT DETECTED NOT DETECTED Final   Respiratory Syncytial Virus NOT DETECTED NOT DETECTED Final   Bordetella pertussis NOT DETECTED NOT DETECTED Final   Bordetella Parapertussis NOT DETECTED NOT DETECTED Final   Chlamydophila pneumoniae NOT DETECTED NOT DETECTED Final   Mycoplasma pneumoniae NOT DETECTED NOT DETECTED Final    Comment: Performed at [facility], [place] [address] [place], [place] [address]    Coagulation Studies: No results for input(s): "LABPROT", "INR" in the last 72 hours.   Urinalysis: No results for input(s): "COLORURINE", "LABSPEC", "PHURINE", "GLUCOSEU", "HGBUR", "BILIRUBINUR", "KETONESUR", "PROTEINUR", "UROBILINOGEN", "NITRITE", "LEUKOCYTESUR" in  the last 72 hours.  Invalid input(s): "APPERANCEUR"    Imaging: No results found.   Medications:    sodium chloride 10 mL/hr at 09/29/21 1100   feeding supplement (NEPRO CARB STEADY) 60 mL/hr at 10/01/21 0800   fentaNYL infusion INTRAVENOUS 100 mcg/hr (10/01/21 0800)   magnesium sulfate bolus IVPB      budesonide (PULMICORT) nebulizer solution  0.5 mg Nebulization BID   Chlorhexidine Gluconate Cloth  6 each Topical Daily   diazepam  2.5 mg Per Tube Q8H   feeding supplement (PROSource TF)  90 mL Per Tube TID   free water  30 mL Per Tube Q4H   heparin injection (subcutaneous)  5,000 Units Subcutaneous Q8H   insulin aspart  0-15 Units Subcutaneous Q4H   ipratropium-albuterol  3 mL Nebulization Q6H   multivitamin  1 tablet Per Tube QHS   nutrition supplement (JUVEN)  1 packet Per Tube BID BM   oxyCODONE  10 mg Per Tube Q8H   pantoprazole sodium  40 mg Per Tube Daily   polyethylene glycol  17 g Per Tube Daily   QUEtiapine  50 mg Per Tube QHS   acetaminophen, acetaminophen, albuterol, alteplase, fentaNYL, guaiFENesin-dextromethorphan, heparin, hydrALAZINE, lidocaine (PF), lidocaine-prilocaine, midazolam, mouth rinse, pentafluoroprop-tetrafluoroeth  Assessment/ Plan:  Gary [name] is a 66 y.o.  male with past medical conditions including COPD requiring 2 L nasal cannula, and congestive heart failure.  Patient presents to  the emergency department with shortness of breath.  Patient has been admitted for COPD exacerbation ([place]) [J44.1] Obesity hypoventilation syndrome ([place]) [E66.2] Acute on chronic diastolic congestive heart failure ([name]) [I50.33] Acute respiratory failure with hypoxia and hypercapnia ([name]) [J96.01, J96.02] Acute on chronic respiratory failure with hypoxia and hypercapnia ([name]) [D66.44, J96.22]    Acute Kidney Injury with hyperkalemia, cause unidentified.  No IV contrast exposure and brief hypotensive event. Normal renal function 2 days prior.Renal Ultrasound  shows mild right obstruction and simple cyst in  left kidney.   -Serum creatinine trends as noted below.   -Urine output of 1840 cc yesterday. -Last hemodialysis done on June 17.  Tolerated well. -Assess daily for need of further hemodialysis -Potassium now in the normal range today.  Lab Results  Component Value Date   CREATININE 2.37 (H) 10/01/2021   CREATININE 3.65 (H) 09/30/2021   CREATININE 3.75 (H) 09/29/2021    Intake/Output Summary (Last 24 hours) at 10/01/2021 0847 Last data filed at 10/01/2021 0800 Gross per 24 hour  Intake 1934.66 ml  Output 1975 ml  Net -40.34 ml    Acute respiratory failure Ventilator assisted FiO2 40% Completed IV cefepime Pseudomonas noted in sputum on September 19, 345  Chronic diastolic heart failure.  Echo from 08/07/2021 shows EF 60 to 65% with a mildly dilated left ventricular cavity and grade 1 diastolic dysfunction. Home regimen of aspirin, carvedilol, Farxiga, simvastatin, torsemide All on hold at present.    LOS: 12 Danasha Melman 6/18/20238:47 AM

## 2021-10-02 ENCOUNTER — Inpatient Hospital Stay: Payer: Medicaid Other

## 2021-10-02 DIAGNOSIS — J9621 Acute and chronic respiratory failure with hypoxia: Secondary | ICD-10-CM | POA: Diagnosis not present

## 2021-10-02 DIAGNOSIS — J9622 Acute and chronic respiratory failure with hypercapnia: Secondary | ICD-10-CM | POA: Diagnosis not present

## 2021-10-02 LAB — COMPREHENSIVE METABOLIC PANEL
ALT: 18 U/L (ref 0–44)
AST: 13 U/L — ABNORMAL LOW (ref 15–41)
Albumin: 2.3 g/dL — ABNORMAL LOW (ref 3.5–5.0)
Alkaline Phosphatase: 64 U/L (ref 38–126)
Anion gap: 10 (ref 5–15)
BUN: 90 mg/dL — ABNORMAL HIGH (ref 8–23)
CO2: 28 mmol/L (ref 22–32)
Calcium: 8.3 mg/dL — ABNORMAL LOW (ref 8.9–10.3)
Chloride: 101 mmol/L (ref 98–111)
Creatinine, Ser: 3.21 mg/dL — ABNORMAL HIGH (ref 0.61–1.24)
GFR, Estimated: 21 mL/min — ABNORMAL LOW (ref >60)
Glucose, Bld: 106 mg/dL — ABNORMAL HIGH (ref 70–99)
Potassium: 4.2 mmol/L (ref 3.5–5.1)
Sodium: 139 mmol/L (ref 135–145)
Total Bilirubin: 0.5 mg/dL (ref 0.3–1.2)
Total Protein: 7.1 g/dL (ref 6.5–8.1)

## 2021-10-02 LAB — CBC WITH DIFFERENTIAL/PLATELET
Abs Immature Granulocytes: 0.08 10*3/uL — ABNORMAL HIGH (ref 0.00–0.07)
Basophils Absolute: 0 10*3/uL (ref 0.0–0.1)
Basophils Relative: 0 %
Eosinophils Absolute: 0.2 10*3/uL (ref 0.0–0.5)
Eosinophils Relative: 1 %
HCT: 35.9 % — ABNORMAL LOW (ref 39.0–52.0)
Hemoglobin: 10.4 g/dL — ABNORMAL LOW (ref 13.0–17.0)
Immature Granulocytes: 1 %
Lymphocytes Relative: 8 %
Lymphs Abs: 0.9 10*3/uL (ref 0.7–4.0)
MCH: 22.8 pg — ABNORMAL LOW (ref 26.0–34.0)
MCHC: 29 g/dL — ABNORMAL LOW (ref 30.0–36.0)
MCV: 78.6 fL — ABNORMAL LOW (ref 80.0–100.0)
Monocytes Absolute: 0.8 10*3/uL (ref 0.1–1.0)
Monocytes Relative: 7 %
Neutro Abs: 9.9 10*3/uL — ABNORMAL HIGH (ref 1.7–7.7)
Neutrophils Relative %: 83 %
Platelets: 179 10*3/uL (ref 150–400)
RBC: 4.57 MIL/uL (ref 4.22–5.81)
RDW: 16.6 % — ABNORMAL HIGH (ref 11.5–15.5)
WBC: 11.9 10*3/uL — ABNORMAL HIGH (ref 4.0–10.5)
nRBC: 0 % (ref 0.0–0.2)

## 2021-10-02 LAB — GLUCOSE, CAPILLARY
Glucose-Capillary: 102 mg/dL — ABNORMAL HIGH (ref 70–99)
Glucose-Capillary: 113 mg/dL — ABNORMAL HIGH (ref 70–99)
Glucose-Capillary: 113 mg/dL — ABNORMAL HIGH (ref 70–99)
Glucose-Capillary: 117 mg/dL — ABNORMAL HIGH (ref 70–99)
Glucose-Capillary: 128 mg/dL — ABNORMAL HIGH (ref 70–99)
Glucose-Capillary: 129 mg/dL — ABNORMAL HIGH (ref 70–99)
Glucose-Capillary: 132 mg/dL — ABNORMAL HIGH (ref 70–99)

## 2021-10-02 LAB — BASIC METABOLIC PANEL
Anion gap: 8 (ref 5–15)
BUN: 78 mg/dL — ABNORMAL HIGH (ref 8–23)
CO2: 29 mmol/L (ref 22–32)
Calcium: 8.4 mg/dL — ABNORMAL LOW (ref 8.9–10.3)
Chloride: 100 mmol/L (ref 98–111)
Creatinine, Ser: 2.4 mg/dL — ABNORMAL HIGH (ref 0.61–1.24)
GFR, Estimated: 29 mL/min — ABNORMAL LOW (ref >60)
Glucose, Bld: 123 mg/dL — ABNORMAL HIGH (ref 70–99)
Potassium: 3.7 mmol/L (ref 3.5–5.1)
Sodium: 137 mmol/L (ref 135–145)

## 2021-10-02 LAB — PHOSPHORUS: Phosphorus: 5.4 mg/dL — ABNORMAL HIGH (ref 2.5–4.6)

## 2021-10-02 LAB — MAGNESIUM: Magnesium: 2.2 mg/dL (ref 1.7–2.4)

## 2021-10-02 MED ORDER — HEPARIN SODIUM (PORCINE) 1000 UNIT/ML IJ SOLN
INTRAMUSCULAR | Status: AC
  Filled 2021-10-02: qty 10

## 2021-10-02 MED ORDER — FUROSEMIDE 10 MG/ML IJ SOLN
40.0000 mg | Freq: Once | INTRAMUSCULAR | Status: AC
  Administered 2021-10-02: 40 mg via INTRAVENOUS
  Filled 2021-10-02: qty 4

## 2021-10-02 MED ORDER — TRAZODONE HCL 100 MG PO TABS
100.0000 mg | ORAL_TABLET | Freq: Every day | ORAL | Status: DC
  Administered 2021-10-02: 100 mg
  Filled 2021-10-02: qty 1

## 2021-10-02 MED ORDER — MIDAZOLAM-SODIUM CHLORIDE 100-0.9 MG/100ML-% IV SOLN
0.5000 mg/h | INTRAVENOUS | Status: DC
  Administered 2021-10-02 – 2021-10-03 (×2): 0.5 mg/h via INTRAVENOUS
  Administered 2021-10-04: 5 mg/h via INTRAVENOUS
  Administered 2021-10-05: 2 mg/h via INTRAVENOUS
  Filled 2021-10-02 (×4): qty 100

## 2021-10-02 MED ORDER — POLYETHYLENE GLYCOL 3350 17 G PO PACK
17.0000 g | PACK | Freq: Two times a day (BID) | ORAL | Status: DC
  Administered 2021-10-02 – 2021-10-14 (×11): 17 g
  Filled 2021-10-02 (×11): qty 1

## 2021-10-02 MED ORDER — CLONAZEPAM 0.5 MG PO TBDP
0.5000 mg | ORAL_TABLET | Freq: Two times a day (BID) | ORAL | Status: DC
Start: 2021-10-02 — End: 2021-10-13
  Administered 2021-10-02 – 2021-10-11 (×13): 0.5 mg
  Filled 2021-10-02 (×13): qty 1

## 2021-10-02 NOTE — Progress Notes (Addendum)
[facility]  ROUNDING NOTE   Subjective:   Gary [name] is a 66 year old male with past medical conditions including COPD requiring 2 L nasal cannula, and congestive heart failure.  Patient presents to the emergency department with shortness of breath.  Patient has been admitted for COPD exacerbation ([place]) [J44.1] Obesity hypoventilation syndrome ([place]) [E66.2] Acute on chronic diastolic congestive heart failure ([name]) [I50.33] Acute respiratory failure with hypoxia and hypercapnia ([name]) [J96.01, J96.02] Acute on chronic respiratory failure with hypoxia and hypercapnia ([name]) [N82.95, J96.22]  Patient is known and has recently been followed by Dr. Holley Raring for acute kidney injury, resolved at his May appointment.    Remains critically ill Sedated with fentanyl No pressors.  Tachycardic. GI: Tube feeds - 70 cc/h Pulmonary: Ventilator assisted, FiO2 50% and 8 PEEP Foley- 1.1L in past 24 hours   Objective:  Vital signs in last 24 hours:  Temp:  [98 F (36.7 C)-98.5 F (36.9 C)] 98.2 F (36.8 C) (06/19 0400) Pulse Rate:  [81-120] 114 (06/19 1000) Resp:  [16-30] 18 (06/19 1000) BP: (97-147)/(58-87) 139/82 (06/19 1000) SpO2:  [90 %-98 %] 93 % (06/19 1000) FiO2 (%):  [50 %] 50 % (06/19 0758)  Weight change:  Filed Weights   09/30/21 0500 10/01/21 0600  Weight: (!) 151.8 kg (!) 150.6 kg    Intake/Output: I/O last 3 completed shifts: In: 3571.6 [I.V.:890; NG/GT:2531.5; IV Piggyback:150.1] Out: 1450 [Urine:1450]   Intake/Output this shift:  Total I/O In: 357.9 [I.V.:79.9; NG/GT:278] Out: 125 [Urine:125]  Physical Exam: General: Critically ill-appearing, morbidly obese gentleman, BMI of 42  Head: Normocephalic, atraumatic.   Eyes: Anicteric  Lungs:  Rhonchi throughout, vent and intubated  Heart: Regular rate and rhythm, tachycardic  Abdomen:  Soft, nontender, obese  Extremities: 2+ peripheral edema.  Neurologic: Sedated  Skin: No lesions  Access: Right IJ  temp cath    Basic Metabolic Panel: Recent Labs  Lab 09/27/21 0325 09/27/21 1641 09/28/21 0412 09/28/21 0737 09/29/21 0410 09/30/21 0526 10/01/21 0526 10/01/21 1436 10/02/21 0349  NA 138   < > 138   < > 139 145 139 138 139  K 5.7*   < > 5.7*   < > 4.2 4.4 3.8 4.0 4.2  CL 103   < > 102   < > 100 107 102 102 101  CO2 22   < > 22   < > '30 29 31 28 28  '$ GLUCOSE 114*   < > 97   < > 105* 101* 97 132* 106*  BUN 119*   < > 140*   < > 96* 115* 78* 95* 90*  CREATININE 4.72*   < > 5.11*   < > 3.75* 3.65* 2.37* 2.77* 3.21*  CALCIUM 7.9*   < > 7.8*   < > 7.8* 8.1* 8.0* 8.3* 8.3*  MG 2.3  --  2.2  --  1.9  --  1.7 2.3 2.2  PHOS 6.5*  --  7.9*  --  6.6*  --  5.1*  --  5.4*   < > = values in this interval not displayed.     Liver Function Tests: Recent Labs  Lab 10/02/21 0349  AST 13*  ALT 18  ALKPHOS 64  BILITOT 0.5  PROT 7.1  ALBUMIN 2.3*   No results for input(s): "LIPASE", "AMYLASE" in the last 168 hours. No results for input(s): "AMMONIA" in the last 168 hours.  CBC: Recent Labs  Lab 09/26/21 6213 09/27/21 0325 09/28/21 0865 10/01/21 0526 10/02/21 0349  WBC 7.8 8.4 9.9 12.5* 11.9*  NEUTROABS  --   --   --   --  9.9*  HGB 13.4 12.2* 11.5* 10.5* 10.4*  HCT 48.9 43.7 40.8 37.3* 35.9*  MCV 81.8 81.4 80.6 81.8 78.6*  PLT 117* 156 155 164 179     Cardiac Enzymes: No results for input(s): "CKTOTAL", "CKMB", "CKMBINDEX", "TROPONINI" in the last 168 hours.  BNP: Invalid input(s): "POCBNP"  CBG: Recent Labs  Lab 10/01/21 1539 10/01/21 2037 10/01/21 2359 10/02/21 0414 10/02/21 0758  GLUCAP 131* 132* 113* 102* 128*     Microbiology: Results for orders placed or performed during the hospital encounter of 09/19/21  SARS Coronavirus 2 by RT PCR (hospital order, performed in [facility] hospital lab) *cepheid single result test* Anterior Nasal Swab     Status: None   Collection Time: 09/19/21  9:57 AM   Specimen: Anterior Nasal Swab  Result Value Ref Range  Status   SARS Coronavirus 2 by RT PCR NEGATIVE NEGATIVE Final    Comment: (NOTE) SARS-CoV-2 target nucleic acids are NOT DETECTED.  The SARS-CoV-2 RNA is generally detectable in upper and lower respiratory specimens during the acute phase of infection. The lowest concentration of SARS-CoV-2 viral copies this assay can detect is 250 copies / mL. A negative result does not preclude SARS-CoV-2 infection and should not be used as the sole basis for treatment or other patient management decisions.  A negative result may occur with improper specimen collection / handling, submission of specimen other than nasopharyngeal swab, presence of viral mutation(s) within the areas targeted by this assay, and inadequate number of viral copies (<250 copies / mL). A negative result must be combined with clinical observations, patient history, and epidemiological information.  Fact Sheet for Patients:   https://www.patel.info/  Fact Sheet for Healthcare Providers: https://hall.com/  This test is not yet approved or  cleared by the [place] FDA and has been authorized for detection and/or diagnosis of SARS-CoV-2 by FDA under an Emergency Use Authorization (EUA).  This EUA will remain in effect (meaning this test can be used) for the duration of the COVID-19 declaration under Section 564(b)(1) of the Act, 21 U.S.C. section 360bbb-3(b)(1), unless the authorization is terminated or revoked sooner.  Performed at [facility], [address] [place], [place] [address]   Culture, Respiratory w Gram Stain     Status: None   Collection Time: 09/19/21 11:55 AM   Specimen: SPU  Result Value Ref Range Status   Specimen Description   Final    SPUTUM Performed at [facility], [place] [address] [place], [place] [address]    Special Requests   Final    NONE Performed at [facility], [place], [place] [address]    Gram  Stain   Final    FEW WBC PRESENT, PREDOMINANTLY PMN FEW GRAM POSITIVE COCCI IN PAIRS Performed at [facility], [place] [address], [place], [place] [address]    Culture RARE [name]  Final   Report Status 09/24/2021 FINAL  Final   Organism ID, Bacteria [name]  Final      Susceptibility   [name] - MIC*    CEFTAZIDIME 4 SENSITIVE Sensitive     CIPROFLOXACIN <=0.25 SENSITIVE Sensitive     GENTAMICIN 2 SENSITIVE Sensitive     IMIPENEM 1 SENSITIVE Sensitive     PIP/TAZO 8 SENSITIVE Sensitive     CEFEPIME 2 SENSITIVE Sensitive     *  RARE [name]  MRSA Next Gen by PCR, Nasal     Status: None   Collection Time: 09/19/21  6:10 PM   Specimen: Nasal Mucosa; Nasal Swab  Result Value Ref Range Status   MRSA by PCR Next Gen NOT DETECTED NOT DETECTED Final    Comment: (NOTE) The GeneXpert MRSA Assay (FDA approved for NASAL specimens only), is one component of a comprehensive MRSA colonization surveillance program. It is not intended to diagnose MRSA infection nor to guide or monitor treatment for MRSA infections. Test performance is not FDA approved in patients less than 71 years old. Performed at [facility], [address] [place], [place] [address]   Gastrointestinal Panel by PCR , Stool     Status: Abnormal   Collection Time: 09/21/21 10:53 AM   Specimen: Stool  Result Value Ref Range Status   Campylobacter species NOT DETECTED NOT DETECTED Final   Plesimonas shigelloides NOT DETECTED NOT DETECTED Final   Salmonella species NOT DETECTED NOT DETECTED Final   Yersinia enterocolitica DETECTED (A) NOT DETECTED Final    Comment: RESULT CALLED TO, READ BACK BY AND VERIFIED WITH: [facility] [place] AT 1126 09/22/21.PMF    Vibrio species NOT DETECTED NOT DETECTED Final   Vibrio cholerae NOT DETECTED NOT DETECTED Final   Enteroaggregative E coli (EAEC) NOT DETECTED NOT DETECTED Final   Enteropathogenic E coli (EPEC) NOT  DETECTED NOT DETECTED Final   Enterotoxigenic E coli (ETEC) NOT DETECTED NOT DETECTED Final   Shiga like toxin producing E coli (STEC) NOT DETECTED NOT DETECTED Final   Shigella/Enteroinvasive E coli (EIEC) NOT DETECTED NOT DETECTED Final   Cryptosporidium NOT DETECTED NOT DETECTED Final   Cyclospora cayetanensis NOT DETECTED NOT DETECTED Final   Entamoeba histolytica NOT DETECTED NOT DETECTED Final   Giardia lamblia NOT DETECTED NOT DETECTED Final   Adenovirus F40/41 NOT DETECTED NOT DETECTED Final   Astrovirus NOT DETECTED NOT DETECTED Final   Norovirus GI/GII NOT DETECTED NOT DETECTED Final   Rotavirus A NOT DETECTED NOT DETECTED Final   Sapovirus (I, II, IV, and V) NOT DETECTED NOT DETECTED Final    Comment: Performed at [facility], [address] [place], [place] [address]  C Difficile Quick Screen w PCR reflex     Status: None   Collection Time: 09/21/21 11:30 AM   Specimen: STOOL  Result Value Ref Range Status   C Diff antigen NEGATIVE NEGATIVE Final   C Diff toxin NEGATIVE NEGATIVE Final   C Diff interpretation No C. difficile detected.  Final    Comment: Performed at [facility], [address] [place], [place] [address]  Culture, blood (Routine X 2) w Reflex to ID Panel     Status: None   Collection Time: 09/21/21 12:28 PM   Specimen: BLOOD  Result Value Ref Range Status   Specimen Description BLOOD BLOOD RIGHT HAND  Final   Special Requests   Final    BOTTLES DRAWN AEROBIC AND ANAEROBIC Blood Culture adequate volume   Culture   Final    NO GROWTH 5 DAYS Performed at [facility], [address] [place], [place] [address]    Report Status 09/26/2021 FINAL  Final  Culture, blood (Routine X 2) w Reflex to ID Panel     Status: None   Collection Time: 09/21/21  2:08 PM   Specimen: BLOOD  Result Value Ref Range Status   Specimen Description BLOOD BLOOD LEFT HAND  Final   Special Requests  Final    BOTTLES DRAWN AEROBIC AND ANAEROBIC  Blood Culture adequate volume   Culture   Final    NO GROWTH 5 DAYS Performed at [facility], [address] [place], [place] [address]    Report Status 09/26/2021 FINAL  Final  Respiratory (~20 pathogens) panel by PCR     Status: None   Collection Time: 09/21/21  6:14 PM   Specimen: Nasopharyngeal Swab; Respiratory  Result Value Ref Range Status   Adenovirus NOT DETECTED NOT DETECTED Final   Coronavirus 229E NOT DETECTED NOT DETECTED Final    Comment: (NOTE) The Coronavirus on the Respiratory Panel, DOES NOT test for the novel  Coronavirus (2019 nCoV)    Coronavirus HKU1 NOT DETECTED NOT DETECTED Final   Coronavirus NL63 NOT DETECTED NOT DETECTED Final   Coronavirus OC43 NOT DETECTED NOT DETECTED Final   Metapneumovirus NOT DETECTED NOT DETECTED Final   Rhinovirus / Enterovirus NOT DETECTED NOT DETECTED Final   Influenza A NOT DETECTED NOT DETECTED Final   Influenza B NOT DETECTED NOT DETECTED Final   Parainfluenza Virus 1 NOT DETECTED NOT DETECTED Final   Parainfluenza Virus 2 NOT DETECTED NOT DETECTED Final   Parainfluenza Virus 3 NOT DETECTED NOT DETECTED Final   Parainfluenza Virus 4 NOT DETECTED NOT DETECTED Final   Respiratory Syncytial Virus NOT DETECTED NOT DETECTED Final   Bordetella pertussis NOT DETECTED NOT DETECTED Final   Bordetella Parapertussis NOT DETECTED NOT DETECTED Final   Chlamydophila pneumoniae NOT DETECTED NOT DETECTED Final   Mycoplasma pneumoniae NOT DETECTED NOT DETECTED Final    Comment: Performed at [facility], [place] [address] [place],  [address]    Coagulation Studies: No results for input(s): "LABPROT", "INR" in the last 72 hours.   Urinalysis: No results for input(s): "COLORURINE", "LABSPEC", "PHURINE", "GLUCOSEU", "HGBUR", "BILIRUBINUR", "KETONESUR", "PROTEINUR", "UROBILINOGEN", "NITRITE", "LEUKOCYTESUR" in the last 72 hours.  Invalid input(s): "APPERANCEUR"    Imaging: No results found.   Medications:     sodium chloride 10 mL/hr at 09/29/21 1100   feeding supplement (NEPRO CARB STEADY) 1,000 mL (10/02/21 1040)   fentaNYL infusion INTRAVENOUS 200 mcg/hr (10/02/21 1000)    budesonide (PULMICORT) nebulizer solution  0.5 mg Nebulization BID   Chlorhexidine Gluconate Cloth  6 each Topical Daily   clonazepam  0.25 mg Per Tube BID   feeding supplement (PROSource TF)  90 mL Per Tube TID   fentaNYL (SUBLIMAZE) injection  50 mcg Intravenous Once   free water  30 mL Per Tube Q4H   heparin injection (subcutaneous)  5,000 Units Subcutaneous Q8H   heparin sodium (porcine)       insulin aspart  0-15 Units Subcutaneous Q4H   ipratropium-albuterol  3 mL Nebulization BID   multivitamin  1 tablet Per Tube QHS   nutrition supplement (JUVEN)  1 packet Per Tube BID BM   oxyCODONE  10 mg Per Tube Q8H   pantoprazole sodium  40 mg Per Tube Daily   polyethylene glycol  17 g Per Tube Daily   QUEtiapine  100 mg Per Tube QHS   acetaminophen, acetaminophen, albuterol, alteplase, fentaNYL, guaiFENesin-dextromethorphan, heparin, heparin sodium (porcine), hydrALAZINE, lidocaine (PF), lidocaine-prilocaine, midazolam, mouth rinse, pentafluoroprop-tetrafluoroeth  Assessment/ Plan:  Mr. MANVEER GOMES is a 66 y.o.  male with past medical conditions including COPD requiring 2 L nasal cannula, and congestive heart failure.  Patient presents to the emergency department with shortness of breath.  Patient has been admitted for COPD exacerbation ([place]) [J44.1] Obesity hypoventilation  syndrome ([place]) [E66.2] Acute on chronic diastolic congestive heart failure ([name]) [I50.33] Acute respiratory failure with hypoxia and hypercapnia ([place]) [J96.01, J96.02] Acute on chronic respiratory failure with hypoxia and hypercapnia ([name]) [J96.21, J96.22]    Acute Kidney Injury with hyperkalemia, cause unidentified.  No IV contrast exposure and brief hypotensive event. Normal renal function 2 days prior.Renal Ultrasound shows mild right  obstruction and simple cyst in  left kidney.   -Serum creatinine trends as noted below.  Urine output of 1175 cc last 24 hours -BUN remains elevated 90 -Scheduled to receive dialysis today, no UF due to adequate urine output. -We will continue to assess dialysis needs daily. -We will order IV furosemide 40 mg once today.  We will continue to monitor daily for additional dosing.  Lab Results  Component Value Date   CREATININE 3.21 (H) 10/02/2021   CREATININE 2.77 (H) 10/01/2021   CREATININE 2.37 (H) 10/01/2021    Intake/Output Summary (Last 24 hours) at 10/02/2021 1108 Last data filed at 10/02/2021 1000 Gross per 24 hour  Intake 2541.15 ml  Output 1165 ml  Net 1376.15 ml    Acute respiratory failure Ventilator assisted FiO2 50% Receiving IV cefepime Pseudomonas noted in sputum on September 19, 1060  Chronic diastolic heart failure.  Echo from 08/07/2021 shows EF 60 to 65% with a mildly dilated left ventricular cavity and grade 1 diastolic dysfunction. Home regimen of aspirin, carvedilol, Farxiga, simvastatin, torsemide All on hold at present.    LOS: [place] 6/19/202311:08 AM

## 2021-10-02 NOTE — Progress Notes (Signed)
[place] for Electrolyte Monitoring and Replacement   Recent Labs: Potassium (mmol/L)  Date Value  10/02/2021 4.2  04/15/2014 3.9   Magnesium (mg/dL)  Date Value  10/02/2021 2.2  08/21/2012 1.9   Calcium (mg/dL)  Date Value  10/02/2021 8.3 (L)   Calcium, Total (mg/dL)  Date Value  04/15/2014 8.3 (L)   Albumin (g/dL)  Date Value  10/02/2021 2.3 (L)  04/15/2014 2.9 (L)   Phosphorus (mg/dL)  Date Value  10/02/2021 5.4 (H)   Sodium (mmol/L)  Date Value  10/02/2021 139  03/15/2020 145 (H)  04/15/2014 140   Assessment: 66 y/o male with h/o PUD, OSA, HTN, CKD III, substance abuse, CHF, Hep C and COPD and COVID 19 (02/2021) who is admitted with COPD exacerbation, Pseudomonas pneumonia on cefepime, HFpEF, and AKI. Intubated and sedated on fentanyl and Precedex. Pharmacy is asked to follow and replace electrolytes while in CCU.  Acute renal failure; nephrology consulted. First HD 6/13. Renal US from 6/13 showed significant amount of urine in bladder and no foley in place. Foley exchanged; 4450 mL UOP 6/14, 2245 UOP 6/15  Goal of Therapy:  Electrolytes within normal limits  Plan:  --Phos elevated defer management to PCCM / nephrology --Follow-up electrolytes with AM labs tomorrow  Wynelle Cleveland, PharmD Pharmacy Resident  10/02/2021 8:28 AM
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
## 2021-10-02 NOTE — Progress Notes (Signed)
Hemodialysis Post Treatment Note   Date: 10/02/21   Access: RIJ CVC   UF Removed:  -253 ml  Next Scheduled Treatment: 10/04/21   Note: Dressing partially off with skin tears to neck visible, dressing removed. Dressing change to RIJ CVC completed with assistance from patient nurse.Patient started treatment as ordered. Patient tolerated treatment with no adverse effects. Patient venous pressure elevated towards last hour of treatment. Line flushed with NS, decrerase BFR, catheter flushed, lines clotted, treatment terminated. Patient hemodynamically stable, report given Lonn Georgia, RN.

## 2021-10-02 NOTE — Progress Notes (Signed)
10/02/2021 4:32 PM  Gary [name] [id number]  Trache cancelled last week.  Has been on dialysis since, Potassium steady    Temp:  [98.1 F (36.7 C)-99.3 F (37.4 C)] 99.3 F (37.4 C) (06/19 1454) Pulse Rate:  [81-120] 94 (06/19 1630) Resp:  [16-27] 18 (06/19 1630) BP: (97-149)/(58-87) 104/71 (06/19 1630) SpO2:  [90 %-99 %] 95 % (06/19 1630) FiO2 (%):  [50 %] 50 % (06/19 1240),     Intake/Output Summary (Last 24 hours) at 10/02/2021 1632 Last data filed at 10/02/2021 1400 Gross per 24 hour  Intake 2247.41 ml  Output 1115 ml  Net 1132.41 ml    Results for orders placed or performed during the hospital encounter of 09/19/21 (from the past 24 hour(s))  Glucose, capillary     Status: Abnormal   Collection Time: 10/01/21  8:37 PM  Result Value Ref Range   Glucose-Capillary 132 (H) 70 - 99 mg/dL  Glucose, capillary     Status: Abnormal   Collection Time: 10/01/21 11:59 PM  Result Value Ref Range   Glucose-Capillary 113 (H) 70 - 99 mg/dL  Magnesium     Status: None   Collection Time: 10/02/21  3:49 AM  Result Value Ref Range   Magnesium 2.2 1.7 - 2.4 mg/dL  CBC with Differential/Platelet     Status: Abnormal   Collection Time: 10/02/21  3:49 AM  Result Value Ref Range   WBC 11.9 (H) 4.0 - 10.5 K/uL   RBC 4.57 4.22 - 5.81 MIL/uL   Hemoglobin 10.4 (L) 13.0 - 17.0 g/dL   HCT 35.9 (L) 39.0 - 52.0 %   MCV 78.6 (L) 80.0 - 100.0 fL   MCH 22.8 (L) 26.0 - 34.0 pg   MCHC 29.0 (L) 30.0 - 36.0 g/dL   RDW 16.6 (H) 11.5 - 15.5 %   Platelets 179 150 - 400 K/uL   nRBC 0.0 0.0 - 0.2 %   Neutrophils Relative % 83 %   Neutro Abs 9.9 (H) 1.7 - 7.7 K/uL   Lymphocytes Relative 8 %   Lymphs Abs 0.9 0.7 - 4.0 K/uL   Monocytes Relative 7 %   Monocytes Absolute 0.8 0.1 - 1.0 K/uL   Eosinophils Relative 1 %   Eosinophils Absolute 0.2 0.0 - 0.5 K/uL   Basophils Relative 0 %   Basophils Absolute 0.0 0.0 - 0.1 K/uL   Immature Granulocytes 1 %   Abs Immature Granulocytes 0.08 (H) 0.00 - 0.07  K/uL  Comprehensive metabolic panel     Status: Abnormal   Collection Time: 10/02/21  3:49 AM  Result Value Ref Range   Sodium 139 135 - 145 mmol/L   Potassium 4.2 3.5 - 5.1 mmol/L   Chloride 101 98 - 111 mmol/L   CO2 28 22 - 32 mmol/L   Glucose, Bld 106 (H) 70 - 99 mg/dL   BUN 90 (H) 8 - 23 mg/dL   Creatinine, Ser 3.21 (H) 0.61 - 1.24 mg/dL   Calcium 8.3 (L) 8.9 - 10.3 mg/dL   Total Protein 7.1 6.5 - 8.1 g/dL   Albumin 2.3 (L) 3.5 - 5.0 g/dL   AST 13 (L) 15 - 41 U/L   ALT 18 0 - 44 U/L   Alkaline Phosphatase 64 38 - 126 U/L   Total Bilirubin 0.5 0.3 - 1.2 mg/dL   GFR, Estimated 21 (L) >60 mL/min   Anion gap 10 5 - 15  Phosphorus     Status: Abnormal   Collection Time: 10/02/21  3:49  AM  Result Value Ref Range   Phosphorus 5.4 (H) 2.5 - 4.6 mg/dL  Glucose, capillary     Status: Abnormal   Collection Time: 10/02/21  4:14 AM  Result Value Ref Range   Glucose-Capillary 102 (H) 70 - 99 mg/dL  Glucose, capillary     Status: Abnormal   Collection Time: 10/02/21  7:58 AM  Result Value Ref Range   Glucose-Capillary 128 (H) 70 - 99 mg/dL  Glucose, capillary     Status: Abnormal   Collection Time: 10/02/21 11:23 AM  Result Value Ref Range   Glucose-Capillary 132 (H) 70 - 99 mg/dL  Glucose, capillary     Status: Abnormal   Collection Time: 10/02/21  4:27 PM  Result Value Ref Range   Glucose-Capillary 117 (H) 70 - 99 mg/dL    SUBJECTIVE:  No change  OBJECTIVE:  No change  IMPRESSION:  Failure to extubate  PLAN:  Plan is for tracheostomy in AM.    Gary [name] 10/02/2021, 4:32 PM

## 2021-10-02 NOTE — Progress Notes (Signed)
Nutrition Follow Up Note   DOCUMENTATION CODES:   Morbid obesity  INTERVENTION:   Continue Nepro '@70ml' /hr + ProSource TF 43m TID via tube   Free water flushes 368mq4 hours to maintain tube patency   Regimen provides 3264kcal/day, 203g/day protein and 140168may of free water.   Will add vitamin D supplementation with diet advancement   Juven Fruit Punch BID via tube, each serving provides 95kcal and 2.5g of protein (amino acids glutamine and arginine)  Rena-vit daily via tube   NUTRITION DIAGNOSIS:   Inadequate oral intake related to inability to eat (pt sedated and ventilated) as evidenced by NPO status.  GOAL:   Provide needs based on ASPEN/SCCM guidelines -met   MONITOR:   Vent status, Labs, Weight trends, TF tolerance, Skin, I & O's  ASSESSMENT:   [age]o male with h/o PUD, OSA, HTN, CKD III, substance abuse, CHF, Hep C and COPD and COVID 19 (02/2021) who is admitted with COPD exacerbation, CHF and AKI. 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  Pt remains sedated and ventilated. OGT in place. Pt tolerating tube feeds well at goal rate. No BM since 6/14; MD aware and bowel regimen initiated. Per chart, pt is down ~44lbs since admission; pt +7.9L on his I & Os. RD unsure if bed weights are accurate. No depletions noted on exam today but difficult to assess pt's muscle mass r/t morbid obesity and edema. Pt continues on HD. Trach rescheduled for 6/20.   Medications reviewed and include: heparin, insulin, rena-vit, juven, oxycodone, protonix, miralax  Labs reviewed: K 4.2 wnl, BUN 90(H), creat 3.21(H), P 5.4(H), Mg 2.2 wnl Vitamin D- 13.06(L)- 1/20 Wbc- 11.9(H), Hgb 10.4(L), Hct 35.9(L) Cbgs- 132, 128, 102 x 24 hrs  Patient is currently intubated on ventilator support MV: 12.8 L/min Temp (24hrs), Avg:98.3 F (36.8 C), Min:98.1 F (36.7 C), Max:98.5 F (36.9 C)  Propofol: none   MAP- >22m38m  UOP- 1175ml25m   Nutrition Focused Physical  Exam:  Flowsheet Row Most Recent Value  Orbital Region No depletion  Upper Arm Region No depletion  Thoracic and Lumbar Region No depletion  Buccal Region No depletion  Temple Region Mild depletion  Clavicle Bone Region Mild depletion  Clavicle and Acromion Bone Region No depletion  Scapular Bone Region No depletion  Dorsal Hand No depletion  Patellar Region No depletion  Anterior Thigh Region No depletion  Posterior Calf Region No depletion  Edema (RD Assessment) Moderate  Hair Reviewed  Eyes Reviewed  Mouth Reviewed  Skin Reviewed  Nails Reviewed   Diet Order:    Diet Order             Diet NPO time specified  Diet effective midnight           Diet NPO time specified  Diet effective midnight                  EDUCATION NEEDS:   No education needs have been identified at this time  Skin:  Skin Assessment: Reviewed RN Assessment (Stage II perineum, MASD)  Last BM:  6/14- type 7  Height:   Ht Readings from Last 1 Encounters:  09/28/21 '6\' 3"'  (1.905 m)    Weight:   Wt Readings from Last 1 Encounters:  10/01/21 (!) 150.6 kg    Ideal Body Weight:  89 kg  BMI:  Body mass index is 41.5 kg/m.  Estimated Nutritional Needs:   Kcal:  3000-3300kcal/day  Protein:  178-225g/day  Fluid:  2.0L/day  Koleen Distance MS, RD, LDN Please refer to [facility] for RD and/or RD on-call/weekend/after hours pager

## 2021-10-03 ENCOUNTER — Inpatient Hospital Stay: Payer: Medicaid Other

## 2021-10-03 ENCOUNTER — Inpatient Hospital Stay: Payer: Medicaid Other | Admitting: Anesthesiology

## 2021-10-03 ENCOUNTER — Other Ambulatory Visit: Payer: Self-pay

## 2021-10-03 ENCOUNTER — Encounter
Admission: EM | Disposition: A | Discharge status: Home / Self Care | Payer: Self-pay | Source: Home / Self Care | Attending: Internal Medicine

## 2021-10-03 DIAGNOSIS — J9621 Acute and chronic respiratory failure with hypoxia: Secondary | ICD-10-CM | POA: Diagnosis not present

## 2021-10-03 DIAGNOSIS — K921 Melena: Secondary | ICD-10-CM | POA: Diagnosis not present

## 2021-10-03 DIAGNOSIS — J9622 Acute and chronic respiratory failure with hypercapnia: Secondary | ICD-10-CM | POA: Diagnosis not present

## 2021-10-03 HISTORY — PX: TRACHEOSTOMY TUBE PLACEMENT: SHX814

## 2021-10-03 LAB — BLOOD GAS, ARTERIAL
Acid-base deficit: 0.1 mmol/L (ref 0.0–2.0)
Bicarbonate: 29.3 mmol/L — ABNORMAL HIGH (ref 20.0–28.0)
FIO2: 100 %
MECHVT: 550 mL
Mechanical Rate: 14
O2 Saturation: 90.2 %
PEEP: 8 cmH2O
Patient temperature: 37
pCO2 arterial: 70 mmHg (ref 32–48)
pH, Arterial: 7.23 — ABNORMAL LOW (ref 7.35–7.45)
pO2, Arterial: 64 mmHg — ABNORMAL LOW (ref 83–108)

## 2021-10-03 LAB — BASIC METABOLIC PANEL
Anion gap: 9 (ref 5–15)
BUN: 84 mg/dL — ABNORMAL HIGH (ref 8–23)
CO2: 28 mmol/L (ref 22–32)
Calcium: 8.6 mg/dL — ABNORMAL LOW (ref 8.9–10.3)
Chloride: 101 mmol/L (ref 98–111)
Creatinine, Ser: 2.44 mg/dL — ABNORMAL HIGH (ref 0.61–1.24)
GFR, Estimated: 29 mL/min — ABNORMAL LOW (ref >60)
Glucose, Bld: 106 mg/dL — ABNORMAL HIGH (ref 70–99)
Potassium: 4.1 mmol/L (ref 3.5–5.1)
Sodium: 138 mmol/L (ref 135–145)

## 2021-10-03 LAB — CBC WITH DIFFERENTIAL/PLATELET
Abs Immature Granulocytes: 0.09 10*3/uL — ABNORMAL HIGH (ref 0.00–0.07)
Basophils Absolute: 0 10*3/uL (ref 0.0–0.1)
Basophils Relative: 0 %
Eosinophils Absolute: 0.2 10*3/uL (ref 0.0–0.5)
Eosinophils Relative: 1 %
HCT: 36.6 % — ABNORMAL LOW (ref 39.0–52.0)
Hemoglobin: 10.4 g/dL — ABNORMAL LOW (ref 13.0–17.0)
Immature Granulocytes: 1 %
Lymphocytes Relative: 6 %
Lymphs Abs: 0.9 10*3/uL (ref 0.7–4.0)
MCH: 22.7 pg — ABNORMAL LOW (ref 26.0–34.0)
MCHC: 28.4 g/dL — ABNORMAL LOW (ref 30.0–36.0)
MCV: 79.9 fL — ABNORMAL LOW (ref 80.0–100.0)
Monocytes Absolute: 1.1 10*3/uL — ABNORMAL HIGH (ref 0.1–1.0)
Monocytes Relative: 8 %
Neutro Abs: 12.1 10*3/uL — ABNORMAL HIGH (ref 1.7–7.7)
Neutrophils Relative %: 84 %
Platelets: 155 10*3/uL (ref 150–400)
RBC: 4.58 MIL/uL (ref 4.22–5.81)
RDW: 16.9 % — ABNORMAL HIGH (ref 11.5–15.5)
WBC: 14.4 10*3/uL — ABNORMAL HIGH (ref 4.0–10.5)
nRBC: 0 % (ref 0.0–0.2)

## 2021-10-03 LAB — CBC
HCT: 33.4 % — ABNORMAL LOW (ref 39.0–52.0)
Hemoglobin: 9.4 g/dL — ABNORMAL LOW (ref 13.0–17.0)
MCH: 22.7 pg — ABNORMAL LOW (ref 26.0–34.0)
MCHC: 28.1 g/dL — ABNORMAL LOW (ref 30.0–36.0)
MCV: 80.7 fL (ref 80.0–100.0)
Platelets: 146 10*3/uL — ABNORMAL LOW (ref 150–400)
RBC: 4.14 MIL/uL — ABNORMAL LOW (ref 4.22–5.81)
RDW: 17 % — ABNORMAL HIGH (ref 11.5–15.5)
WBC: 12.8 10*3/uL — ABNORMAL HIGH (ref 4.0–10.5)
nRBC: 0 % (ref 0.0–0.2)

## 2021-10-03 LAB — PROTIME-INR
INR: 1.2 (ref 0.8–1.2)
Prothrombin Time: 15 seconds (ref 11.4–15.2)

## 2021-10-03 LAB — HEMOGLOBIN AND HEMATOCRIT, BLOOD
HCT: 31.8 % — ABNORMAL LOW (ref 39.0–52.0)
HCT: 34.2 % — ABNORMAL LOW (ref 39.0–52.0)
Hemoglobin: 9.3 g/dL — ABNORMAL LOW (ref 13.0–17.0)
Hemoglobin: 9.8 g/dL — ABNORMAL LOW (ref 13.0–17.0)

## 2021-10-03 LAB — GLUCOSE, CAPILLARY
Glucose-Capillary: 107 mg/dL — ABNORMAL HIGH (ref 70–99)
Glucose-Capillary: 78 mg/dL (ref 70–99)
Glucose-Capillary: 87 mg/dL (ref 70–99)
Glucose-Capillary: 89 mg/dL (ref 70–99)
Glucose-Capillary: 96 mg/dL (ref 70–99)

## 2021-10-03 LAB — MAGNESIUM: Magnesium: 2 mg/dL (ref 1.7–2.4)

## 2021-10-03 LAB — PHOSPHORUS: Phosphorus: 5.9 mg/dL — ABNORMAL HIGH (ref 2.5–4.6)

## 2021-10-03 LAB — FIBRINOGEN: Fibrinogen: 670 mg/dL — ABNORMAL HIGH (ref 210–475)

## 2021-10-03 SURGERY — CREATION, TRACHEOSTOMY
Anesthesia: General

## 2021-10-03 MED ORDER — ROCURONIUM BROMIDE 10 MG/ML (PF) SYRINGE
PREFILLED_SYRINGE | INTRAVENOUS | Status: AC
  Filled 2021-10-03: qty 10

## 2021-10-03 MED ORDER — FENTANYL CITRATE (PF) 100 MCG/2ML IJ SOLN
INTRAMUSCULAR | Status: AC
  Filled 2021-10-03: qty 2

## 2021-10-03 MED ORDER — PANTOPRAZOLE 80MG IVPB - SIMPLE MED
80.0000 mg | Freq: Once | INTRAVENOUS | Status: AC
  Administered 2021-10-03: 80 mg via INTRAVENOUS
  Filled 2021-10-03: qty 100

## 2021-10-03 MED ORDER — KETAMINE HCL-SODIUM CHLORIDE 100-0.9 MG/10ML-% IV SOSY
PREFILLED_SYRINGE | INTRAVENOUS | Status: DC | PRN
  Administered 2021-10-03: 50 mg via INTRAVENOUS

## 2021-10-03 MED ORDER — MIDAZOLAM HCL 2 MG/2ML IJ SOLN
INTRAMUSCULAR | Status: DC | PRN
  Administered 2021-10-03 (×2): 2 mg via INTRAVENOUS

## 2021-10-03 MED ORDER — PROPOFOL 10 MG/ML IV BOLUS
INTRAVENOUS | Status: AC
  Filled 2021-10-03: qty 20

## 2021-10-03 MED ORDER — HEPARIN SODIUM (PORCINE) 5000 UNIT/ML IJ SOLN: 5000.0000 [IU] | Freq: Three times a day (TID) | INTRAMUSCULAR | Status: DC

## 2021-10-03 MED ORDER — BISACODYL 10 MG RE SUPP: 10.0000 mg | Freq: Every day | RECTAL | Status: DC | PRN

## 2021-10-03 MED ORDER — KETAMINE HCL 50 MG/5ML IJ SOSY
PREFILLED_SYRINGE | INTRAMUSCULAR | Status: AC
  Filled 2021-10-03: qty 5

## 2021-10-03 MED ORDER — OXYMETAZOLINE HCL 0.05 % NA SOLN
NASAL | Status: DC | PRN
  Administered 2021-10-03: 2 via NASAL

## 2021-10-03 MED ORDER — FUROSEMIDE 10 MG/ML IJ SOLN
40.0000 mg | Freq: Once | INTRAMUSCULAR | Status: AC
  Administered 2021-10-03: 40 mg via INTRAVENOUS
  Filled 2021-10-03: qty 4

## 2021-10-03 MED ORDER — OXYMETAZOLINE HCL 0.05 % NA SOLN
NASAL | Status: AC
Start: 2021-10-03 — End: ?
  Filled 2021-10-03: qty 30

## 2021-10-03 MED ORDER — MIDAZOLAM HCL 2 MG/2ML IJ SOLN
INTRAMUSCULAR | Status: AC
  Filled 2021-10-03: qty 2

## 2021-10-03 MED ORDER — ORAL CARE MOUTH RINSE
15.0000 mL | OROMUCOSAL | Status: DC
  Administered 2021-10-04 – 2021-10-17 (×155): 15 mL via OROMUCOSAL

## 2021-10-03 MED ORDER — SODIUM CHLORIDE 0.9 % IV SOLN: INTRAVENOUS | Status: DC | PRN

## 2021-10-03 MED ORDER — BISACODYL 10 MG RE SUPP
10.0000 mg | Freq: Once | RECTAL | Status: DC
Start: 2021-10-03 — End: 2021-10-05

## 2021-10-03 MED ORDER — SODIUM CHLORIDE 0.9% IV SOLUTION: Freq: Once | INTRAVENOUS | Status: DC

## 2021-10-03 MED ORDER — PANTOPRAZOLE INFUSION (NEW) - SIMPLE MED
8.0000 mg/h | INTRAVENOUS | Status: DC
  Administered 2021-10-03 – 2021-10-04 (×4): 8 mg/h via INTRAVENOUS
  Filled 2021-10-03 (×3): qty 100

## 2021-10-03 MED ORDER — PHENYLEPHRINE 80 MCG/ML (10ML) SYRINGE FOR IV PUSH (FOR BLOOD PRESSURE SUPPORT)
PREFILLED_SYRINGE | INTRAVENOUS | Status: AC
  Filled 2021-10-03: qty 10

## 2021-10-03 MED ORDER — ORAL CARE MOUTH RINSE: 15.0000 mL | OROMUCOSAL | Status: DC | PRN

## 2021-10-03 MED ORDER — ROCURONIUM BROMIDE 100 MG/10ML IV SOLN
INTRAVENOUS | Status: DC | PRN
  Administered 2021-10-03: 70 mg via INTRAVENOUS
  Administered 2021-10-03: 30 mg via INTRAVENOUS
  Administered 2021-10-03: 40 mg via INTRAVENOUS

## 2021-10-03 MED ORDER — EPHEDRINE 5 MG/ML INJ
INTRAVENOUS | Status: AC
  Filled 2021-10-03: qty 5

## 2021-10-03 MED ORDER — SODIUM CHLORIDE 3 % IN NEBU
4.0000 mL | INHALATION_SOLUTION | Freq: Once | RESPIRATORY_TRACT | Status: AC
Start: 2021-10-03 — End: 2021-10-03
  Administered 2021-10-03: 4 mL via RESPIRATORY_TRACT
  Filled 2021-10-03: qty 4

## 2021-10-03 MED ORDER — PANTOPRAZOLE SODIUM 40 MG IV SOLR
40.0000 mg | Freq: Two times a day (BID) | INTRAVENOUS | Status: DC
  Administered 2021-10-06 – 2021-10-15 (×19): 40 mg via INTRAVENOUS
  Filled 2021-10-03 (×19): qty 10

## 2021-10-03 MED ORDER — LIDOCAINE-EPINEPHRINE 1 %-1:100000 IJ SOLN
INTRAMUSCULAR | Status: DC | PRN
  Administered 2021-10-03: 4 mL

## 2021-10-03 SURGICAL SUPPLY — 38 items
BLADE SURG 15 STRL LF DISP TIS (BLADE) ×1 IMPLANT
BLADE SURG 15 STRL SS (BLADE) ×2
BLADE SURG SZ11 CARB STEEL (BLADE) ×2 IMPLANT
CORD BIP STRL DISP 12FT (MISCELLANEOUS) IMPLANT
ELECT CAUTERY BLADE TIP 2.5 (TIP) ×2
ELECT REM PT RETURN 9FT ADLT (ELECTROSURGICAL) ×2
ELECTRODE CAUTERY BLDE TIP 2.5 (TIP) ×1 IMPLANT
ELECTRODE REM PT RTRN 9FT ADLT (ELECTROSURGICAL) ×1 IMPLANT
FORCEPS JEWEL BIP 4-3/4 STR (INSTRUMENTS) IMPLANT
GAUZE 4X4 16PLY ~~LOC~~+RFID DBL (SPONGE) ×2 IMPLANT
GLOVE BIO SURGEON STRL SZ7.5 (GLOVE) ×2 IMPLANT
GOWN STRL REUS W/ TWL LRG LVL3 (GOWN DISPOSABLE) ×3 IMPLANT
GOWN STRL REUS W/TWL LRG LVL3 (GOWN DISPOSABLE) ×6
HLDR TRACH TUBE NECKBAND 18 (MISCELLANEOUS) ×1 IMPLANT
HOLDER TRACH TUBE NECKBAND 18 (MISCELLANEOUS) ×2
LABEL OR SOLS (LABEL) ×2 IMPLANT
MANIFOLD NEPTUNE II (INSTRUMENTS) ×2 IMPLANT
NS IRRIG 500ML POUR BTL (IV SOLUTION) ×2 IMPLANT
PACK HEAD/NECK (MISCELLANEOUS) ×2 IMPLANT
SHEARS HARMONIC 9CM CVD (BLADE) ×2 IMPLANT
SPONGE DRAIN TRACH 4X4 STRL 2S (GAUZE/BANDAGES/DRESSINGS) ×2 IMPLANT
SPONGE KITTNER 5P (MISCELLANEOUS) ×2 IMPLANT
SPONGE VERSALON 4X4 4PLY (MISCELLANEOUS) ×2 IMPLANT
SUCTION FRAZIER HANDLE 10FR (MISCELLANEOUS) ×2
SUCTION TUBE FRAZIER 10FR DISP (MISCELLANEOUS) ×1 IMPLANT
SUT ETHILON 2 0 FS 18 (SUTURE) ×2 IMPLANT
SUT SILK 2 0 (SUTURE)
SUT SILK 2 0 SH (SUTURE) ×2 IMPLANT
SUT SILK 2-0 18XBRD TIE 12 (SUTURE) IMPLANT
SUT VIC AB 3-0 PS2 18 (SUTURE) ×2 IMPLANT
SYR 3ML LL SCALE MARK (SYRINGE) ×2 IMPLANT
TUBE TRACH  6.0 CUFF FLEX (MISCELLANEOUS)
TUBE TRACH 6 EXL DIST CUF (TUBING) IMPLANT
TUBE TRACH 6.0 CUFF FLEX (MISCELLANEOUS) IMPLANT
TUBE TRACH 7.0 EXL PROX CUF (TUBING) ×1 IMPLANT
TUBE TRACH FLEX 8.0 CUFF (MISCELLANEOUS)
TUBE TRACH FLEX 8.5 CUFF (MISCELLANEOUS) IMPLANT
WATER STERILE IRR 500ML POUR (IV SOLUTION) ×2 IMPLANT

## 2021-10-03 NOTE — TOC Progression Note (Signed)
Transition of Care [facility]) - Progression Note    Patient Details  Name: Gary [name] MRN: [id number] Date of Birth: 1955-11-28  Transition of [facility]) CM/SW Contact  Shelbie Hutching, RN Phone Number: 10/03/2021, 10:56 AM  Clinical Narrative:    Patient went to OR this morning for Tracheostomy, #78 XLT cuffed trach tube was placed.  Jenn with [facility] updated and she continues to follow patient progress.  Patient received dialysis yesterday, per nephrology note they will assess daily for continued need.    TOC will cont to follow, discharge plan will hopefully be LTAC at [facility] in [place] if they will accept patient.    Expected Discharge Plan: Long Term Acute Care (LTAC) Barriers to Discharge: Continued Medical Work up  Expected Discharge Plan and Services Expected Discharge Plan: Long Term Acute Care (LTAC)   Discharge Planning Services: CM Consult Post Acute Care Choice: Long Term Acute Care (LTAC) Living arrangements for the past 2 months: Single Family Home                 DME Arranged: N/A DME Agency: NA       HH Arranged: NA HH Agency: NA         Social Determinants of Health (SDOH) Interventions    Readmission Risk Interventions    05/04/2021    1:26 PM 04/07/2021    1:39 PM 03/12/2021   11:54 AM  Readmission Risk Prevention Plan  Transportation Screening Complete Complete Complete  PCP or Specialist Appt within 3-5 Days  Complete Complete  HRI or [place]   Complete  Social Work Consult for [place] Planning/Counseling   Complete  Palliative Care Screening  Not Applicable Not Applicable  Medication Review [employer] photographer) Complete Complete Complete  PCP or Specialist appointment within 3-5 days of discharge Complete    HRI or [place] Complete    SW Recovery Care/Counseling Consult Complete    [place] Not Applicable
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
## 2021-10-03 NOTE — Transfer of Care (Signed)
Immediate Anesthesia Transfer of Care Note  Patient: Gary [name]  Procedure(s) Performed: TRACHEOSTOMY  Patient Location: ICU  Anesthesia Type:General  Level of Consciousness: sedated  Airway & Oxygen Therapy: Patient remains intubated per anesthesia plan and Patient placed on Ventilator (see vital sign flow sheet for setting)  Post-op Assessment: Report given to RN and Post -op Vital signs reviewed and stable  Post vital signs: Reviewed and stable  Last Vitals:  Vitals Value Taken Time  BP 137/87 10/03/21 0845  Temp    Pulse 95 10/03/21 0850  Resp 34 10/03/21 0850  SpO2 90 % 10/03/21 0850  Vitals shown include unvalidated device data.  Last Pain:  Vitals:   10/03/21 0400  TempSrc:   PainSc: 0-No pain         Complications: No notable events documented.

## 2021-10-03 NOTE — Progress Notes (Signed)
10/03/2021 5:34 PM  Gary [name] [id number]  Postop check status post tracheostomy    Temp:  [98 F (36.7 C)-99 F (37.2 C)] 98.2 F (36.8 C) (06/20 0845) Pulse Rate:  [83-105] 87 (06/20 1630) Resp:  [14-25] 20 (06/20 1630) BP: (109-147)/(65-100) 117/78 (06/20 1630) SpO2:  [82 %-100 %] 97 % (06/20 1630) FiO2 (%):  [40 %-100 %] 40 % (06/20 1620) Weight:  [162 kg] 162 kg (06/20 0500),     Intake/Output Summary (Last 24 hours) at 10/03/2021 1734 Last data filed at 10/03/2021 1300 Gross per 24 hour  Intake 1582.91 ml  Output 557 ml  Net 1025.91 ml    Results for orders placed or performed during the hospital encounter of 09/19/21 (from the past 24 hour(s))  Glucose, capillary     Status: Abnormal   Collection Time: 10/02/21  7:19 PM  Result Value Ref Range   Glucose-Capillary 129 (H) 70 - 99 mg/dL  Basic metabolic panel     Status: Abnormal   Collection Time: 10/02/21  9:52 PM  Result Value Ref Range   Sodium 137 135 - 145 mmol/L   Potassium 3.7 3.5 - 5.1 mmol/L   Chloride 100 98 - 111 mmol/L   CO2 29 22 - 32 mmol/L   Glucose, Bld 123 (H) 70 - 99 mg/dL   BUN 78 (H) 8 - 23 mg/dL   Creatinine, Ser 2.40 (H) 0.61 - 1.24 mg/dL   Calcium 8.4 (L) 8.9 - 10.3 mg/dL   GFR, Estimated 29 (L) >60 mL/min   Anion gap 8 5 - 15  Glucose, capillary     Status: Abnormal   Collection Time: 10/02/21 11:50 PM  Result Value Ref Range   Glucose-Capillary 113 (H) 70 - 99 mg/dL  Glucose, capillary     Status: Abnormal   Collection Time: 10/03/21  3:53 AM  Result Value Ref Range   Glucose-Capillary 107 (H) 70 - 99 mg/dL  Basic metabolic panel     Status: Abnormal   Collection Time: 10/03/21  5:05 AM  Result Value Ref Range   Sodium 138 135 - 145 mmol/L   Potassium 4.1 3.5 - 5.1 mmol/L   Chloride 101 98 - 111 mmol/L   CO2 28 22 - 32 mmol/L   Glucose, Bld 106 (H) 70 - 99 mg/dL   BUN 84 (H) 8 - 23 mg/dL   Creatinine, Ser 2.44 (H) 0.61 - 1.24 mg/dL   Calcium 8.6 (L) 8.9 - 10.3 mg/dL    GFR, Estimated 29 (L) >60 mL/min   Anion gap 9 5 - 15  CBC with Differential/Platelet     Status: Abnormal   Collection Time: 10/03/21  5:05 AM  Result Value Ref Range   WBC 14.4 (H) 4.0 - 10.5 K/uL   RBC 4.58 4.22 - 5.81 MIL/uL   Hemoglobin 10.4 (L) 13.0 - 17.0 g/dL   HCT 36.6 (L) 39.0 - 52.0 %   MCV 79.9 (L) 80.0 - 100.0 fL   MCH 22.7 (L) 26.0 - 34.0 pg   MCHC 28.4 (L) 30.0 - 36.0 g/dL   RDW 16.9 (H) 11.5 - 15.5 %   Platelets 155 150 - 400 K/uL   nRBC 0.0 0.0 - 0.2 %   Neutrophils Relative % 84 %   Neutro Abs 12.1 (H) 1.7 - 7.7 K/uL   Lymphocytes Relative 6 %   Lymphs Abs 0.9 0.7 - 4.0 K/uL   Monocytes Relative 8 %   Monocytes Absolute 1.1 (H) 0.1 - 1.0 K/uL  Eosinophils Relative 1 %   Eosinophils Absolute 0.2 0.0 - 0.5 K/uL   Basophils Relative 0 %   Basophils Absolute 0.0 0.0 - 0.1 K/uL   RBC Morphology MORPHOLOGY UNREMARKABLE    Immature Granulocytes 1 %   Abs Immature Granulocytes 0.09 (H) 0.00 - 0.07 K/uL  Magnesium     Status: None   Collection Time: 10/03/21  5:05 AM  Result Value Ref Range   Magnesium 2.0 1.7 - 2.4 mg/dL  Phosphorus     Status: Abnormal   Collection Time: 10/03/21  5:05 AM  Result Value Ref Range   Phosphorus 5.9 (H) 2.5 - 4.6 mg/dL  Blood gas, arterial     Status: Abnormal   Collection Time: 10/03/21  9:27 AM  Result Value Ref Range   FIO2 100 %   Delivery systems VENTILATOR    Mode PRESSURE REGULATED VOLUME CONTROL    MECHVT 550 mL   PEEP 8 cm H20   pH, Arterial 7.23 (L) 7.35 - 7.45   pCO2 arterial 70 (HH) 32 - 48 mmHg   pO2, Arterial 64 (L) 83 - 108 mmHg   Bicarbonate 29.3 (H) 20.0 - 28.0 mmol/L   Acid-base deficit 0.1 0.0 - 2.0 mmol/L   O2 Saturation 90.2 %   Patient temperature 37.0    Collection site RIGHT RADIAL    Mechanical Rate 14   Glucose, capillary     Status: None   Collection Time: 10/03/21 11:43 AM  Result Value Ref Range   Glucose-Capillary 89 70 - 99 mg/dL  Prepare RBC (crossmatch)     Status: None   Collection  Time: 10/03/21 12:06 PM  Result Value Ref Range   Order Confirmation      ORDER PROCESSED BY BLOOD BANK Performed at [facility], [address], [place], [place] [address]   Protime-INR     Status: None   Collection Time: 10/03/21 12:50 PM  Result Value Ref Range   Prothrombin Time 15.0 11.4 - 15.2 seconds   INR 1.2 0.8 - 1.2  Fibrinogen     Status: Abnormal   Collection Time: 10/03/21 12:50 PM  Result Value Ref Range   Fibrinogen 670 (H) 210 - 475 mg/dL  Type and screen [facility]     Status: None (Preliminary result)   Collection Time: 10/03/21 12:51 PM  Result Value Ref Range   ABO/RH(D) A NEG    Antibody Screen NEG    Sample Expiration 10/06/2021,2359    Antibody Identification      ANTI A1 Performed at [facility], [address], [place],  [address]    Unit Number [id number]    Blood Component Type RED CELLS,LR    Unit division 00    Status of Unit ALLOCATED    Transfusion Status OK TO TRANSFUSE    Crossmatch Result COMPATIBLE    Unit Number [id number]    Blood Component Type RED CELLS,LR    Unit division 00    Status of Unit ALLOCATED    Transfusion Status OK TO TRANSFUSE    Crossmatch Result COMPATIBLE   CBC     Status: Abnormal   Collection Time: 10/03/21 12:51 PM  Result Value Ref Range   WBC 12.8 (H) 4.0 - 10.5 K/uL   RBC 4.14 (L) 4.22 - 5.81 MIL/uL   Hemoglobin 9.4 (L) 13.0 - 17.0 g/dL   HCT 33.4 (L) 39.0 - 52.0 %   MCV 80.7 80.0 - 100.0 fL   MCH 22.7 (  L) 26.0 - 34.0 pg   MCHC 28.1 (L) 30.0 - 36.0 g/dL   RDW 17.0 (H) 11.5 - 15.5 %   Platelets 146 (L) 150 - 400 K/uL   nRBC 0.0 0.0 - 0.2 %  Blood gas, arterial     Status: Abnormal (Preliminary result)   Collection Time: 10/03/21  3:00 PM  Result Value Ref Range   FIO2 60 %   Delivery systems VENTILATOR    Mode PRESSURE REGULATED VOLUME CONTROL    MECHVT 510 mL   RATE 20 resp/min   PEEP 10 cm H20   pH, Arterial 7.3 (L) 7.35 - 7.45   pCO2  arterial 57 (H) 32 - 48 mmHg   pO2, Arterial 72 (L) 83 - 108 mmHg   Bicarbonate 28.0 20.0 - 28.0 mmol/L   Acid-Base Excess 0.4 0.0 - 2.0 mmol/L   O2 Saturation 95.7 %   Patient temperature 37.0    Collection site RIGHT RADIAL    Allens test (pass/fail) PENDING PASS  Glucose, capillary     Status: None   Collection Time: 10/03/21  4:24 PM  Result Value Ref Range   Glucose-Capillary 87 70 - 99 mg/dL    SUBJECTIVE: Patient sedated and being ventilated  OBJECTIVE: Trach site appears clean and dry functioning properly  IMPRESSION: Status post tracheostomy tube placement  PLAN: Doing well status post tracheostomy.  Routine postop tracheostomy tube care being performed.  Roena Malady 10/03/2021, 5:34 PM

## 2021-10-03 NOTE — Anesthesia Postprocedure Evaluation (Signed)
Anesthesia Post Note  Patient: Gary [name]  Procedure(s) Performed: TRACHEOSTOMY  Patient location during evaluation: PACU Anesthesia Type: General Level of consciousness: sedated Pain management: pain level controlled Vital Signs Assessment: post-procedure vital signs reviewed and stable Respiratory status: respiratory function stable and patient on ventilator - see flowsheet for VS Cardiovascular status: blood pressure returned to baseline and stable Postop Assessment: no apparent nausea or vomiting Anesthetic complications: no Comments: Nose bleed with intermittent hemorrhage to be managed by ICU staff.    No notable events documented.   Last Vitals:  Vitals:   10/03/21 0600 10/03/21 0700  BP: 115/85 112/78  Pulse: (!) 102   Resp: 17   Temp:    SpO2: 97%     Last Pain:  Vitals:   10/03/21 0400  TempSrc:   PainSc: 0-No pain                 Iran Ouch

## 2021-10-03 NOTE — Progress Notes (Signed)
[place] for Electrolyte Monitoring and Replacement   Recent Labs: Potassium (mmol/L)  Date Value  10/03/2021 4.1  04/15/2014 3.9   Magnesium (mg/dL)  Date Value  10/03/2021 2.0  08/21/2012 1.9   Calcium (mg/dL)  Date Value  10/03/2021 8.6 (L)   Calcium, Total (mg/dL)  Date Value  04/15/2014 8.3 (L)   Albumin (g/dL)  Date Value  10/02/2021 2.3 (L)  04/15/2014 2.9 (L)   Phosphorus (mg/dL)  Date Value  10/03/2021 5.9 (H)   Sodium (mmol/L)  Date Value  10/03/2021 138  03/15/2020 145 (H)  04/15/2014 140   Assessment: 66 y/o male with h/o PUD, OSA, HTN, CKD III, substance abuse, CHF, Hep C and COPD and COVID 19 (02/2021) who is admitted with COPD exacerbation, Pseudomonas pneumonia on cefepime, HFpEF, and AKI. Intubated and sedated on fentanyl and Precedex. Pharmacy is asked to follow and replace electrolytes while in CCU.  New start HD patient. Nephrology consulted. Last HD 6/19.   Goal of Therapy:  Electrolytes within normal limits  Plan: No replacement warranted --Phos elevated defer management to PCCM / nephrology --Follow-up electrolytes with AM labs tomorrow  Wynelle Cleveland, PharmD Pharmacy Resident  10/03/2021 7:07 AM

## 2021-10-03 NOTE — Progress Notes (Signed)
Pt arrived to rm ICU 9 post tracheostomy, pt experiencing epistaxis d/t NGT attempt. Pt started to desat O2 mid 80's, NP Keene at bedside with RT, orders received for CXR, ABG, and nebulizers.RN removed a 2.5 inch clot with subglotic suctioning, NP aware. Pt O2 improved. Pt VSS.

## 2021-10-03 NOTE — Op Note (Signed)
10/03/2021 8:27 AM  [name] [id number]  Pre-Op Dx: Acute Respiratory failure Post-Op Dx:  Same  Proc:  Tracheostomy  Surg:  Roena Malady  Assistant: Roswell Miners:  GOT  EBL: Less than 10 cc  Comp: None  Findings: Normal anterior neck anatomy  Procedure:  The patient was brought from the intensive care unit to the operating room and transferred to an operating table.  Anesthesia was administered per indwelling orotracheal tube.   Neck extension was achieved as possible anda shoulder rolke was placed.  The lower neck was palpated with the findings as described above.  1% Xylocaine with 1:100,000 epinephrine, 4 cc's, was infiltrated into the surgical field for intraoperative hemostasis.  Several minutes were allowed for this to take effect. The patient was prepped in a sterile fashion with a surgical prep from the chin down to the upper chest.  Sterile draping was accomplished in the standard fashion.  A 3 cm horizontal incision was made sharply a finger's breadth above the sternal notch, and extended through skin and subcutaneous fat.  Using cautery, the superficial layer of the deep cervical fascia was lysed.  Additional dissection revealed the strap muscles.  The midline raphe was divided in two layers and the muscles retracted laterally.  The pretracheal plane was visualized.  This was entered bluntly.  The thyroid isthmus was isolated and divided with the Harmonic scalpel.  The thyroid gland was retracted to either side.  The anterior face of the trachea was cleared.  In the third interspace, a transverse incision was made between cartilage rings into the tracheal lumen.  A 6 mm wide inferiorly based flap was generated and secured to the lower wound with a 4-0 Vicryl suture.   A previously tested  #7 XLT cuffed tracheostomy tube was brought into the field.  With the endotracheal tube under direct visualization through the tracheostomy, it was gently backed up.  The tracheostomy  tube was inserted into the tracheal lumen.  Hemostasis was observed. The cuff was inflated and observed to be intact and containing pressure. The inner cannula was placed and ventilation assumed per tracheostomy tube.  Good chest wall motion was observed, and CO2 was documented per anesthesia.  The trach tube was secured in the standard fashion with trach ties. A 2-0 silk suture was used to secure the trach tube to the skin on both sides.  Hemostasis was observed again.  When satisfactory ventilation was assured, the orotracheal tube was removed.  At this point the procedure was completed.  The patient was returned to anesthesia, awakened as possible, and transferred back to the intensive care unit in stable condition.  Comment: 66 y.o. male with prolonged ventilation was the indication for today's procedure.  Anticipate a routine postoperative recovery including standard tracheal hygiene.  The sutures should be removed in 7 days.  When the patient no longer requires ventilator or pressure support, the cuff should be deflated.  Changing to an uncuffed tube and downsizing will be according to the clinical condition of the patient.   Roena Malady  8:27 AM 10/03/2021

## 2021-10-03 NOTE — Progress Notes (Signed)
Pt had X4 large bloody BMs, VSS, RN notified NP Dewaine Conger, orders received for CBC, Type and Screen.

## 2021-10-03 NOTE — Progress Notes (Signed)
[facility]  ROUNDING NOTE   Subjective:   Gary [name] is a 66 year old male with past medical conditions including COPD requiring 2 L nasal cannula, and congestive heart failure.  Patient presents to the emergency department with shortness of breath.  Patient has been admitted for COPD exacerbation ([place]) [J44.1] Obesity hypoventilation syndrome ([place]) [E66.2] Acute on chronic diastolic congestive heart failure ([name]) [I50.33] Acute respiratory failure with hypoxia and hypercapnia ([name]) [J96.01, J96.02] Acute on chronic respiratory failure with hypoxia and hypercapnia ([name]) [X90.24, J96.22]  Patient is known and has recently been followed by Dr. Holley Raring for acute kidney injury, resolved at his May appointment.    Remains critically ill Sedation: fentanyl No pressors.  GI: Tube feeds, held due to procedure Pulmonary: Ventilator assisted, FiO2 40% and 8 PEEP Foley- 1.2L in past 24 hours   Objective:  Vital signs in last 24 hours:  Temp:  [98 F (36.7 C)-99.3 F (37.4 C)] 98.2 F (36.8 C) (06/20 0845) Pulse Rate:  [83-108] 95 (06/20 1045) Resp:  [14-27] 20 (06/20 1045) BP: (103-149)/(64-100) 137/92 (06/20 1045) SpO2:  [82 %-100 %] 94 % (06/20 1045) FiO2 (%):  [40 %-100 %] 100 % (06/20 0945) Weight:  [162 kg] 162 kg (06/20 0500)  Weight change:  Filed Weights   10/01/21 0600 10/03/21 0500  Weight: (!) 150.6 kg (!) 162 kg    Intake/Output: I/O last 3 completed shifts: In: 3240.2 [I.V.:1019.5; NG/GT:2220.7] Out: 0973 [Urine:1800]   Intake/Output this shift:  Total I/O In: 309.8 [I.V.:309.8] Out: 10 [Blood:10]  Physical Exam: General: Critically ill-appearing, morbidly obese gentleman, BMI of 42  Head: Normocephalic, atraumatic.   Eyes: Anicteric  Lungs:  Rhonchi throughout, vent and trach placed 10/03/21  Heart: Regular rate and rhythm, tachycardic  Abdomen:  Soft, nontender, obese  Extremities: 2+ peripheral edema.  Neurologic: Sedated  Skin: No  lesions  Access: Right IJ temp cath    Basic Metabolic Panel: Recent Labs  Lab 09/28/21 0412 09/28/21 0737 09/29/21 0410 09/30/21 0526 10/01/21 0526 10/01/21 1436 10/02/21 0349 10/02/21 2152 10/03/21 0505  NA 138   < > 139   < > 139 138 139 137 138  K 5.7*   < > 4.2   < > 3.8 4.0 4.2 3.7 4.1  CL 102   < > 100   < > 102 102 101 100 101  CO2 22   < > 30   < > '31 28 28 29 28  '$ GLUCOSE 97   < > 105*   < > 97 132* 106* 123* 106*  BUN 140*   < > 96*   < > 78* 95* 90* 78* 84*  CREATININE 5.11*   < > 3.75*   < > 2.37* 2.77* 3.21* 2.40* 2.44*  CALCIUM 7.8*   < > 7.8*   < > 8.0* 8.3* 8.3* 8.4* 8.6*  MG 2.2  --  1.9  --  1.7 2.3 2.2  --  2.0  PHOS 7.9*  --  6.6*  --  5.1*  --  5.4*  --  5.9*   < > = values in this interval not displayed.     Liver Function Tests: Recent Labs  Lab 10/02/21 0349  AST 13*  ALT 18  ALKPHOS 64  BILITOT 0.5  PROT 7.1  ALBUMIN 2.3*    No results for input(s): "LIPASE", "AMYLASE" in the last 168 hours. No results for input(s): "AMMONIA" in the last 168 hours.  CBC: Recent Labs  Lab 09/27/21 0325  09/28/21 0412 10/01/21 0526 10/02/21 0349 10/03/21 0505  WBC 8.4 9.9 12.5* 11.9* 14.4*  NEUTROABS  --   --   --  9.9* 12.1*  HGB 12.2* 11.5* 10.5* 10.4* 10.4*  HCT 43.7 40.8 37.3* 35.9* 36.6*  MCV 81.4 80.6 81.8 78.6* 79.9*  PLT 156 155 164 179 155     Cardiac Enzymes: No results for input(s): "CKTOTAL", "CKMB", "CKMBINDEX", "TROPONINI" in the last 168 hours.  BNP: Invalid input(s): "POCBNP"  CBG: Recent Labs  Lab 10/02/21 1123 10/02/21 1627 10/02/21 1919 10/02/21 2350 10/03/21 0353  GLUCAP 132* 117* 129* 113* 107*     Microbiology: Results for orders placed or performed during the hospital encounter of 09/19/21  SARS Coronavirus 2 by RT PCR (hospital order, performed in [facility] hospital lab) *cepheid single result test* Anterior Nasal Swab     Status: None   Collection Time: 09/19/21  9:57 AM   Specimen: Anterior Nasal Swab   Result Value Ref Range Status   SARS Coronavirus 2 by RT PCR NEGATIVE NEGATIVE Final    Comment: (NOTE) SARS-CoV-2 target nucleic acids are NOT DETECTED.  The SARS-CoV-2 RNA is generally detectable in upper and lower respiratory specimens during the acute phase of infection. The lowest concentration of SARS-CoV-2 viral copies this assay can detect is 250 copies / mL. A negative result does not preclude SARS-CoV-2 infection and should not be used as the sole basis for treatment or other patient management decisions.  A negative result may occur with improper specimen collection / handling, submission of specimen other than nasopharyngeal swab, presence of viral mutation(s) within the areas targeted by this assay, and inadequate number of viral copies (<250 copies / mL). A negative result must be combined with clinical observations, patient history, and epidemiological information.  Fact Sheet for Patients:   https://www.patel.info/  Fact Sheet for Healthcare Providers: https://hall.com/  This test is not yet approved or  cleared by the [place] FDA and has been authorized for detection and/or diagnosis of SARS-CoV-2 by FDA under an Emergency Use Authorization (EUA).  This EUA will remain in effect (meaning this test can be used) for the duration of the COVID-19 declaration under Section 564(b)(1) of the Act, 21 U.S.C. section 360bbb-3(b)(1), unless the authorization is terminated or revoked sooner.  Performed at [facility], [address] [place], [place] [address]   Culture, Respiratory w Gram Stain     Status: None   Collection Time: 09/19/21 11:55 AM   Specimen: SPU  Result Value Ref Range Status   Specimen Description   Final    SPUTUM Performed at [facility], [place] [address], [place], [place] [address]    Special Requests   Final    NONE Performed at [facility], [place], [place] [address]    Gram Stain   Final    FEW WBC PRESENT, PREDOMINANTLY PMN FEW GRAM POSITIVE COCCI IN PAIRS Performed at [facility], [place] [address], [place], [place] [address]    Culture RARE [name]  Final   Report Status 09/24/2021 FINAL  Final   Organism ID, Bacteria [name]  Final      Susceptibility   [name] - MIC*    CEFTAZIDIME 4 SENSITIVE Sensitive     CIPROFLOXACIN <=0.25 SENSITIVE Sensitive     GENTAMICIN 2 SENSITIVE Sensitive     IMIPENEM 1 SENSITIVE Sensitive     PIP/TAZO 8 SENSITIVE Sensitive     CEFEPIME  2 SENSITIVE Sensitive     * RARE [name]  MRSA Next Gen by PCR, Nasal     Status: None   Collection Time: 09/19/21  6:10 PM   Specimen: Nasal Mucosa; Nasal Swab  Result Value Ref Range Status   MRSA by PCR Next Gen NOT DETECTED NOT DETECTED Final    Comment: (NOTE) The GeneXpert MRSA Assay (FDA approved for NASAL specimens only), is one component of a comprehensive MRSA colonization surveillance program. It is not intended to diagnose MRSA infection nor to guide or monitor treatment for MRSA infections. Test performance is not FDA approved in patients less than 36 years old. Performed at [facility], [address] [place], [place] [address]   Gastrointestinal Panel by PCR , Stool     Status: Abnormal   Collection Time: 09/21/21 10:53 AM   Specimen: Stool  Result Value Ref Range Status   Campylobacter species NOT DETECTED NOT DETECTED Final   Plesimonas shigelloides NOT DETECTED NOT DETECTED Final   Salmonella species NOT DETECTED NOT DETECTED Final   Yersinia enterocolitica DETECTED (A) NOT DETECTED Final    Comment: RESULT CALLED TO, READ BACK BY AND VERIFIED WITH: [facility] JACKSON AT 1126 09/22/21.PMF    Vibrio species NOT DETECTED NOT DETECTED Final   Vibrio cholerae NOT DETECTED NOT DETECTED Final   Enteroaggregative E coli (EAEC) NOT DETECTED NOT DETECTED Final    Enteropathogenic E coli (EPEC) NOT DETECTED NOT DETECTED Final   Enterotoxigenic E coli (ETEC) NOT DETECTED NOT DETECTED Final   Shiga like toxin producing E coli (STEC) NOT DETECTED NOT DETECTED Final   Shigella/Enteroinvasive E coli (EIEC) NOT DETECTED NOT DETECTED Final   Cryptosporidium NOT DETECTED NOT DETECTED Final   Cyclospora cayetanensis NOT DETECTED NOT DETECTED Final   Entamoeba histolytica NOT DETECTED NOT DETECTED Final   Giardia lamblia NOT DETECTED NOT DETECTED Final   Adenovirus F40/41 NOT DETECTED NOT DETECTED Final   Astrovirus NOT DETECTED NOT DETECTED Final   Norovirus GI/GII NOT DETECTED NOT DETECTED Final   Rotavirus A NOT DETECTED NOT DETECTED Final   Sapovirus (I, II, IV, and V) NOT DETECTED NOT DETECTED Final    Comment: Performed at [facility], [address] [place], [place] [address]  C Difficile Quick Screen w PCR reflex     Status: None   Collection Time: 09/21/21 11:30 AM   Specimen: STOOL  Result Value Ref Range Status   C Diff antigen NEGATIVE NEGATIVE Final   C Diff toxin NEGATIVE NEGATIVE Final   C Diff interpretation No C. difficile detected.  Final    Comment: Performed at [facility], [address] [place], [place] [address]  Culture, blood (Routine X 2) w Reflex to ID Panel     Status: None   Collection Time: 09/21/21 12:28 PM   Specimen: BLOOD  Result Value Ref Range Status   Specimen Description BLOOD BLOOD RIGHT HAND  Final   Special Requests   Final    BOTTLES DRAWN AEROBIC AND ANAEROBIC Blood Culture adequate volume   Culture   Final    NO GROWTH 5 DAYS Performed at [facility], [address], [place], [place] [address]    Report Status 09/26/2021 FINAL  Final  Culture, blood (Routine X 2) w Reflex to ID Panel     Status: None   Collection Time: 09/21/21  2:08 PM   Specimen: BLOOD  Result Value Ref Range Status   Specimen Description BLOOD BLOOD LEFT HAND  Final   Special Requests   Final     BOTTLES DRAWN AEROBIC AND ANAEROBIC Blood Culture adequate volume   Culture   Final    NO GROWTH 5 DAYS Performed at [facility], [address] [place], [place] [address]    Report Status 09/26/2021 FINAL  Final  Respiratory (~20 pathogens) panel by PCR     Status: None   Collection Time: 09/21/21  6:14 PM   Specimen: Nasopharyngeal Swab; Respiratory  Result Value Ref Range Status   Adenovirus NOT DETECTED NOT DETECTED Final   Coronavirus 229E NOT DETECTED NOT DETECTED Final    Comment: (NOTE) The Coronavirus on the Respiratory Panel, DOES NOT test for the novel  Coronavirus (2019 nCoV)    Coronavirus HKU1 NOT DETECTED NOT DETECTED Final   Coronavirus NL63 NOT DETECTED NOT DETECTED Final   Coronavirus OC43 NOT DETECTED NOT DETECTED Final   Metapneumovirus NOT DETECTED NOT DETECTED Final   Rhinovirus / Enterovirus NOT DETECTED NOT DETECTED Final   Influenza A NOT DETECTED NOT DETECTED Final   Influenza B NOT DETECTED NOT DETECTED Final   Parainfluenza Virus 1 NOT DETECTED NOT DETECTED Final   Parainfluenza Virus 2 NOT DETECTED NOT DETECTED Final   Parainfluenza Virus 3 NOT DETECTED NOT DETECTED Final   Parainfluenza Virus 4 NOT DETECTED NOT DETECTED Final   Respiratory Syncytial Virus NOT DETECTED NOT DETECTED Final   Bordetella pertussis NOT DETECTED NOT DETECTED Final   Bordetella Parapertussis NOT DETECTED NOT DETECTED Final   Chlamydophila pneumoniae NOT DETECTED NOT DETECTED Final   Mycoplasma pneumoniae NOT DETECTED NOT DETECTED Final    Comment: Performed at [facility], [place] [address], [place], [place] [address]    Coagulation Studies: No results for input(s): "LABPROT", "INR" in the last 72 hours.   Urinalysis: No results for input(s): "COLORURINE", "LABSPEC", "PHURINE", "GLUCOSEU", "HGBUR", "BILIRUBINUR", "KETONESUR", "PROTEINUR", "UROBILINOGEN", "NITRITE", "LEUKOCYTESUR" in the last 72 hours.  Invalid input(s): "APPERANCEUR"    Imaging: DG  Chest Port 1 View  Result Date: 10/03/2021 CLINICAL DATA:  Hypoxia. EXAM: PORTABLE CHEST 1 VIEW COMPARISON:  October 02, 2021. FINDINGS: Stable cardiomegaly. Tracheostomy tube is in grossly good position. Right internal jugular catheter is unchanged. Bibasilar opacities are again noted concerning for atelectasis, infiltrates or possibly edema. Old left rib fractures are again noted. IMPRESSION: Bibasilar opacities are again noted concerning for atelectasis, infiltrates or possibly edema. Electronically Signed   By: Marijo Conception M.D.   On: 10/03/2021 09:48   DG Chest Port 1 View  Result Date: 10/02/2021 CLINICAL DATA:  Acute on chronic respiratory failure EXAM: PORTABLE CHEST 1 VIEW COMPARISON:  High radiograph 09/26/2021 FINDINGS: Endotracheal tube overlies the midthoracic trachea. Right neck catheter tip overlies the mid superior vena cava. Orogastric tube passes below the diaphragm, tip excluded by collimation. Unchanged, enlarged cardiomediastinal silhouette. There is pulmonary vascular congestion with interstitial opacities bilaterally, similar to prior. There are bibasilar opacities. No pneumothorax. No large effusion. No acute osseous abnormality. IMPRESSION: No significant change in bilateral interstitial and airspace disease. Electronically Signed   By: Maurine Simmering M.D.   On: 10/02/2021 13:53     Medications:    sodium chloride 10 mL/hr at 10/03/21 0500   feeding supplement (NEPRO CARB STEADY) 70 mL/hr at 10/03/21 1000   fentaNYL infusion INTRAVENOUS 200 mcg/hr (10/03/21 0900)   midazolam 4 mg/hr (10/03/21 0930)    bisacodyl  10 mg Rectal Once   budesonide (PULMICORT) nebulizer solution  0.5 mg Nebulization BID  Chlorhexidine Gluconate Cloth  6 each Topical Daily   clonazepam  0.5 mg Per Tube BID   feeding supplement (PROSource TF)  90 mL Per Tube TID   fentaNYL (SUBLIMAZE) injection  50 mcg Intravenous Once   free water  30 mL Per Tube Q4H   [START ON 10/04/2021] heparin injection  (subcutaneous)  5,000 Units Subcutaneous Q8H   insulin aspart  0-15 Units Subcutaneous Q4H   ipratropium-albuterol  3 mL Nebulization BID   multivitamin  1 tablet Per Tube QHS   nutrition supplement (JUVEN)  1 packet Per Tube BID BM   oxyCODONE  10 mg Per Tube Q8H   pantoprazole sodium  40 mg Per Tube Daily   polyethylene glycol  17 g Per Tube BID   traZODone  100 mg Per Tube QHS   acetaminophen, acetaminophen, albuterol, alteplase, [START ON 10/04/2021] bisacodyl, fentaNYL, guaiFENesin-dextromethorphan, heparin, hydrALAZINE, lidocaine (PF), lidocaine-prilocaine, midazolam, mouth rinse, pentafluoroprop-tetrafluoroeth  Assessment/ Plan:  Gary [name] is a 66 y.o.  male with past medical conditions including COPD requiring 2 L nasal cannula, and congestive heart failure.  Patient presents to the emergency department with shortness of breath.  Patient has been admitted for COPD exacerbation ([place]) [J44.1] Obesity hypoventilation syndrome ([place]) [E66.2] Acute on chronic diastolic congestive heart failure ([name]) [I50.33] Acute respiratory failure with hypoxia and hypercapnia ([name]) [J96.01, J96.02] Acute on chronic respiratory failure with hypoxia and hypercapnia ([name]) [F02.63, J96.22]    Acute Kidney Injury with hyperkalemia, cause unidentified.  No IV contrast exposure and brief hypotensive event. Normal renal function 2 days prior.Renal Ultrasound shows mild right obstruction and simple cyst in  left kidney.   -Creatinine appears stable however BUN remains elevated, 84.  Urine output adequate at 1.2 L in previous 24 hours.  Patient given IV furosemide 40 mg once yesterday.  We will repeat this dose today. -We will provide next dialysis treatment tomorrow with no UF.  We will continue to monitor need for dialysis daily. -Potassium corrected with dialysis  Lab Results  Component Value Date   CREATININE 2.44 (H) 10/03/2021   CREATININE 2.40 (H) 10/02/2021   CREATININE 3.21 (H) 10/02/2021     Intake/Output Summary (Last 24 hours) at 10/03/2021 1132 Last data filed at 10/03/2021 1000 Gross per 24 hour  Intake 1880.13 ml  Output 832 ml  Net 1048.13 ml    Acute respiratory failure Ventilator assisted, trach placed today. FiO2 40% Receiving IV cefepime Pseudomonas noted in sputum on September 19, 2021   Chronic diastolic heart failure.  Echo from 08/07/2021 shows EF 60 to 65% with a mildly dilated left ventricular cavity and grade 1 diastolic dysfunction. Home regimen of aspirin, carvedilol, Farxiga, simvastatin, torsemide All on hold at present.    LOS: [place] 6/20/202311:32 AM

## 2021-10-03 NOTE — Progress Notes (Signed)
Updated pt's sister [name] via telephone. Discussed successful Tracheostomy this morning, epistaxis with NG tube attempt with GI Bleed, trending H&H, and GI consult.  All questions answered to her satisfaction.   Darel Hong, AGACNP-BC [facility] Prefer epic messenger for cross cover needs If after hours, please call E-link

## 2021-10-04 ENCOUNTER — Encounter: Payer: Self-pay | Admitting: Unknown Physician Specialty

## 2021-10-04 ENCOUNTER — Inpatient Hospital Stay: Payer: Medicaid Other

## 2021-10-04 DIAGNOSIS — J9622 Acute and chronic respiratory failure with hypercapnia: Secondary | ICD-10-CM | POA: Diagnosis not present

## 2021-10-04 DIAGNOSIS — K921 Melena: Secondary | ICD-10-CM | POA: Diagnosis not present

## 2021-10-04 DIAGNOSIS — J9621 Acute and chronic respiratory failure with hypoxia: Secondary | ICD-10-CM | POA: Diagnosis not present

## 2021-10-04 LAB — CBC
HCT: 31.3 % — ABNORMAL LOW (ref 39.0–52.0)
Hemoglobin: 9.1 g/dL — ABNORMAL LOW (ref 13.0–17.0)
MCH: 22.8 pg — ABNORMAL LOW (ref 26.0–34.0)
MCHC: 29.1 g/dL — ABNORMAL LOW (ref 30.0–36.0)
MCV: 78.4 fL — ABNORMAL LOW (ref 80.0–100.0)
Platelets: 143 10*3/uL — ABNORMAL LOW (ref 150–400)
RBC: 3.99 MIL/uL — ABNORMAL LOW (ref 4.22–5.81)
RDW: 16.5 % — ABNORMAL HIGH (ref 11.5–15.5)
WBC: 11.4 10*3/uL — ABNORMAL HIGH (ref 4.0–10.5)
nRBC: 0 % (ref 0.0–0.2)

## 2021-10-04 LAB — BASIC METABOLIC PANEL
Anion gap: 9 (ref 5–15)
BUN: 98 mg/dL — ABNORMAL HIGH (ref 8–23)
CO2: 26 mmol/L (ref 22–32)
Calcium: 8.4 mg/dL — ABNORMAL LOW (ref 8.9–10.3)
Chloride: 103 mmol/L (ref 98–111)
Creatinine, Ser: 3.77 mg/dL — ABNORMAL HIGH (ref 0.61–1.24)
GFR, Estimated: 17 mL/min — ABNORMAL LOW (ref >60)
Glucose, Bld: 84 mg/dL (ref 70–99)
Potassium: 4.2 mmol/L (ref 3.5–5.1)
Sodium: 138 mmol/L (ref 135–145)

## 2021-10-04 LAB — HEMOGLOBIN AND HEMATOCRIT, BLOOD
HCT: 29.9 % — ABNORMAL LOW (ref 39.0–52.0)
HCT: 30 % — ABNORMAL LOW (ref 39.0–52.0)
Hemoglobin: 8.9 g/dL — ABNORMAL LOW (ref 13.0–17.0)
Hemoglobin: 8.7 g/dL — ABNORMAL LOW (ref 13.0–17.0)

## 2021-10-04 LAB — PHOSPHORUS: Phosphorus: 5.8 mg/dL — ABNORMAL HIGH (ref 2.5–4.6)

## 2021-10-04 LAB — GLUCOSE, CAPILLARY
Glucose-Capillary: 131 mg/dL — ABNORMAL HIGH (ref 70–99)
Glucose-Capillary: 68 mg/dL — ABNORMAL LOW (ref 70–99)
Glucose-Capillary: 69 mg/dL — ABNORMAL LOW (ref 70–99)
Glucose-Capillary: 74 mg/dL (ref 70–99)
Glucose-Capillary: 75 mg/dL (ref 70–99)
Glucose-Capillary: 79 mg/dL (ref 70–99)
Glucose-Capillary: 85 mg/dL (ref 70–99)

## 2021-10-04 LAB — MAGNESIUM: Magnesium: 2 mg/dL (ref 1.7–2.4)

## 2021-10-04 MED ORDER — DEXTROSE 50 % IV SOLN
INTRAVENOUS | Status: AC
  Filled 2021-10-04: qty 50

## 2021-10-04 MED ORDER — DEXTROSE 50 % IV SOLN
INTRAVENOUS | Status: AC
  Administered 2021-10-04: 12.5 g via INTRAVENOUS
  Filled 2021-10-04: qty 50

## 2021-10-04 MED ORDER — HEPARIN SODIUM (PORCINE) 1000 UNIT/ML IJ SOLN
INTRAMUSCULAR | Status: AC
  Administered 2021-10-04: 2800 [IU] via INTRAVENOUS_CENTRAL
  Filled 2021-10-04: qty 10

## 2021-10-04 MED ORDER — FUROSEMIDE 10 MG/ML IJ SOLN
80.0000 mg | Freq: Once | INTRAMUSCULAR | Status: AC
Start: 2021-10-04 — End: 2021-10-04
  Administered 2021-10-04: 80 mg via INTRAVENOUS
  Filled 2021-10-04: qty 8

## 2021-10-04 MED ORDER — DEXTROSE 50 % IV SOLN
12.5000 g | INTRAVENOUS | Status: AC
  Administered 2021-10-04: 12.5 g via INTRAVENOUS

## 2021-10-04 MED ORDER — OXYCODONE HCL 5 MG/5ML PO SOLN: 15.0000 mg | Freq: Three times a day (TID) | ORAL | Status: DC

## 2021-10-04 MED ORDER — FUROSEMIDE 10 MG/ML IJ SOLN
100.0000 mg | Freq: Once | INTRAVENOUS | Status: DC
  Filled 2021-10-04: qty 10

## 2021-10-04 MED ORDER — DEXTROSE 50 % IV SOLN: 12.5000 g | INTRAVENOUS | Status: AC

## 2021-10-04 MED ORDER — NOREPINEPHRINE 16 MG/250ML-% IV SOLN
0.0000 ug/min | INTRAVENOUS | Status: DC
  Administered 2021-10-04 – 2021-10-06 (×2): 2 ug/min via INTRAVENOUS
  Filled 2021-10-04: qty 250

## 2021-10-04 MED ORDER — VITAMIN D 25 MCG (1000 UNIT) PO TABS
2000.0000 [IU] | ORAL_TABLET | Freq: Every day | ORAL | Status: DC
  Administered 2021-10-06 – 2021-11-06 (×25): 2000 [IU]
  Filled 2021-10-04 (×28): qty 2

## 2021-10-04 NOTE — Progress Notes (Signed)
[place] for Electrolyte Monitoring and Replacement   Recent Labs: Potassium (mmol/L)  Date Value  10/04/2021 4.2  04/15/2014 3.9   Magnesium (mg/dL)  Date Value  10/04/2021 2.0  08/21/2012 1.9   Calcium (mg/dL)  Date Value  10/04/2021 8.4 (L)   Calcium, Total (mg/dL)  Date Value  04/15/2014 8.3 (L)   Albumin (g/dL)  Date Value  10/02/2021 2.3 (L)  04/15/2014 2.9 (L)   Phosphorus (mg/dL)  Date Value  10/04/2021 5.8 (H)   Sodium (mmol/L)  Date Value  10/04/2021 138  03/15/2020 145 (H)  04/15/2014 140   Assessment: 66 y/o male with h/o PUD, OSA, HTN, CKD III, substance abuse, CHF, Hep C and COPD and COVID 19 (02/2021) who is admitted with COPD exacerbation, Pseudomonas pneumonia on cefepime, HFpEF, and AKI. Intubated and sedated on fentanyl and Precedex. Pharmacy is asked to follow and replace electrolytes while in CCU.  New start HD patient. Nephrology consulted. Next HD 6/21  Goal of Therapy:  Electrolytes within normal limits  Plan: No replacement warranted --Phos elevated defer management to PCCM / nephrology --Follow-up electrolytes with AM labs tomorrow  Wynelle Cleveland, PharmD Pharmacy Resident  10/04/2021 7:21 AM

## 2021-10-04 NOTE — Progress Notes (Signed)
[facility]  ROUNDING NOTE   Subjective:   Gary [name] is a 66 year old male with past medical conditions including COPD requiring 2 L nasal cannula, and congestive heart failure.  Patient presents to the emergency department with shortness of breath.  Patient has been admitted for COPD exacerbation ([place]) [J44.1] Obesity hypoventilation syndrome ([place]) [E66.2] Acute on chronic diastolic congestive heart failure ([name]) [I50.33] Acute respiratory failure with hypoxia and hypercapnia ([name]) [J96.01, J96.02] Acute on chronic respiratory failure with hypoxia and hypercapnia ([name]) [Z76.73, J96.22]  Patient is known and has recently been followed by Dr. Holley Raring for acute kidney injury, resolved at his May appointment.    Patient remains critically ill, fentanyl and versed sedation Trached on vent, 40% FiO2 Tube feeds 70 mL/h Generalized edema Foley catheter-850 mL recorded output.   Objective:  Vital signs in last 24 hours:  Temp:  [97.5 F (36.4 C)-99 F (37.2 C)] 97.5 F (36.4 C) (06/21 0800) Pulse Rate:  [81-98] 83 (06/21 0930) Resp:  [14-25] 20 (06/21 0700) BP: (103-137)/(62-96) 120/65 (06/21 0900) SpO2:  [89 %-100 %] 94 % (06/21 0930) FiO2 (%):  [40 %-60 %] 40 % (06/21 0845) Weight:  [162 kg] 162 kg (06/21 0351)  Weight change: 0 kg Filed Weights   10/03/21 0500 10/04/21 0351  Weight: (!) 162 kg (!) 162 kg    Intake/Output: I/O last 3 completed shifts: In: 1467.5 [I.V.:1117.5; NG/GT:350] Out: 1060 [Urine:1050; Blood:10]   Intake/Output this shift:  Total I/O In: 60.6 [I.V.:60.6] Out: -   Physical Exam: General: Critically ill-appearing, morbidly obese gentleman, BMI of 42  Head: Normocephalic, atraumatic.   Eyes: Anicteric  Lungs:  Rhonchi throughout, vent and trach placed 10/03/21  Heart: Regular rhythm, tachycardic  Abdomen:  Soft, nontender, obese  Extremities: 2+ peripheral edema.  Neurologic: Sedated  Skin: No lesions  Access: Right IJ temp  cath    Basic Metabolic Panel: Recent Labs  Lab 09/29/21 0410 09/30/21 0526 10/01/21 0526 10/01/21 1436 10/02/21 0349 10/02/21 2152 10/03/21 0505 10/04/21 0447  NA 139   < > 139 138 139 137 138 138  K 4.2   < > 3.8 4.0 4.2 3.7 4.1 4.2  CL 100   < > 102 102 101 100 101 103  CO2 30   < > '31 28 28 29 28 26  '$ GLUCOSE 105*   < > 97 132* 106* 123* 106* 84  BUN 96*   < > 78* 95* 90* 78* 84* 98*  CREATININE 3.75*   < > 2.37* 2.77* 3.21* 2.40* 2.44* 3.77*  CALCIUM 7.8*   < > 8.0* 8.3* 8.3* 8.4* 8.6* 8.4*  MG 1.9  --  1.7 2.3 2.2  --  2.0 2.0  PHOS 6.6*  --  5.1*  --  5.4*  --  5.9* 5.8*   < > = values in this interval not displayed.     Liver Function Tests: Recent Labs  Lab 10/02/21 0349  AST 13*  ALT 18  ALKPHOS 64  BILITOT 0.5  PROT 7.1  ALBUMIN 2.3*    No results for input(s): "LIPASE", "AMYLASE" in the last 168 hours. No results for input(s): "AMMONIA" in the last 168 hours.  CBC: Recent Labs  Lab 10/01/21 0526 10/02/21 0349 10/03/21 0505 10/03/21 1251 10/03/21 1814 10/03/21 2345 10/04/21 0447  WBC 12.5* 11.9* 14.4* 12.8*  --   --  11.4*  NEUTROABS  --  9.9* 12.1*  --   --   --   --  HGB 10.5* 10.4* 10.4* 9.4* 9.8* 9.3* 9.1*  HCT 37.3* 35.9* 36.6* 33.4* 34.2* 31.8* 31.3*  MCV 81.8 78.6* 79.9* 80.7  --   --  78.4*  PLT 164 179 155 146*  --   --  143*     Cardiac Enzymes: No results for input(s): "CKTOTAL", "CKMB", "CKMBINDEX", "TROPONINI" in the last 168 hours.  BNP: Invalid input(s): "POCBNP"  CBG: Recent Labs  Lab 10/03/21 1949 10/03/21 2338 10/04/21 0347 10/04/21 0822 10/04/21 0937  GLUCAP 78 96 74 68* 131*     Microbiology: Results for orders placed or performed during the hospital encounter of 09/19/21  SARS Coronavirus 2 by RT PCR (hospital order, performed in [facility] hospital lab) *cepheid single result test* Anterior Nasal Swab     Status: None   Collection Time: 09/19/21  9:57 AM   Specimen: Anterior Nasal Swab  Result Value  Ref Range Status   SARS Coronavirus 2 by RT PCR NEGATIVE NEGATIVE Final    Comment: (NOTE) SARS-CoV-2 target nucleic acids are NOT DETECTED.  The SARS-CoV-2 RNA is generally detectable in upper and lower respiratory specimens during the acute phase of infection. The lowest concentration of SARS-CoV-2 viral copies this assay can detect is 250 copies / mL. A negative result does not preclude SARS-CoV-2 infection and should not be used as the sole basis for treatment or other patient management decisions.  A negative result may occur with improper specimen collection / handling, submission of specimen other than nasopharyngeal swab, presence of viral mutation(s) within the areas targeted by this assay, and inadequate number of viral copies (<250 copies / mL). A negative result must be combined with clinical observations, patient history, and epidemiological information.  Fact Sheet for Patients:   https://www.patel.info/  Fact Sheet for Healthcare Providers: https://hall.com/  This test is not yet approved or  cleared by the [place] FDA and has been authorized for detection and/or diagnosis of SARS-CoV-2 by FDA under an Emergency Use Authorization (EUA).  This EUA will remain in effect (meaning this test can be used) for the duration of the COVID-19 declaration under Section 564(b)(1) of the Act, 21 U.S.C. section 360bbb-3(b)(1), unless the authorization is terminated or revoked sooner.  Performed at [facility], [address] [place], [place] [address]   Culture, Respiratory w Gram Stain     Status: None   Collection Time: 09/19/21 11:55 AM   Specimen: SPU  Result Value Ref Range Status   Specimen Description   Final    SPUTUM Performed at [facility], [place] [address] [place], [place] [address]    Special Requests   Final    NONE Performed at [facility], [place], [place]  [address]    Gram Stain   Final    FEW WBC PRESENT, PREDOMINANTLY PMN FEW GRAM POSITIVE COCCI IN PAIRS Performed at [facility], [place] [address], [place], [place] [address]    Culture RARE [name]  Final   Report Status 09/24/2021 FINAL  Final   Organism ID, Bacteria [name]  Final      Susceptibility   [name] - MIC*    CEFTAZIDIME 4 SENSITIVE Sensitive     CIPROFLOXACIN <=0.25 SENSITIVE Sensitive     GENTAMICIN 2 SENSITIVE Sensitive     IMIPENEM 1 SENSITIVE Sensitive     PIP/TAZO 8 SENSITIVE Sensitive     CEFEPIME 2 SENSITIVE Sensitive     * RARE [name]  MRSA  Next Gen by PCR, Nasal     Status: None   Collection Time: 09/19/21  6:10 PM   Specimen: Nasal Mucosa; Nasal Swab  Result Value Ref Range Status   MRSA by PCR Next Gen NOT DETECTED NOT DETECTED Final    Comment: (NOTE) The GeneXpert MRSA Assay (FDA approved for NASAL specimens only), is one component of a comprehensive MRSA colonization surveillance program. It is not intended to diagnose MRSA infection nor to guide or monitor treatment for MRSA infections. Test performance is not FDA approved in patients less than 38 years old. Performed at [facility], [address] [place], [place] [address]   Gastrointestinal Panel by PCR , Stool     Status: Abnormal   Collection Time: 09/21/21 10:53 AM   Specimen: Stool  Result Value Ref Range Status   Campylobacter species NOT DETECTED NOT DETECTED Final   Plesimonas shigelloides NOT DETECTED NOT DETECTED Final   Salmonella species NOT DETECTED NOT DETECTED Final   Yersinia enterocolitica DETECTED (A) NOT DETECTED Final    Comment: RESULT CALLED TO, READ BACK BY AND VERIFIED WITH: [facility] JACKSON AT 1126 09/22/21.PMF    Vibrio species NOT DETECTED NOT DETECTED Final   Vibrio cholerae NOT DETECTED NOT DETECTED Final   Enteroaggregative E coli (EAEC) NOT DETECTED NOT DETECTED Final   Enteropathogenic E  coli (EPEC) NOT DETECTED NOT DETECTED Final   Enterotoxigenic E coli (ETEC) NOT DETECTED NOT DETECTED Final   Shiga like toxin producing E coli (STEC) NOT DETECTED NOT DETECTED Final   Shigella/Enteroinvasive E coli (EIEC) NOT DETECTED NOT DETECTED Final   Cryptosporidium NOT DETECTED NOT DETECTED Final   Cyclospora cayetanensis NOT DETECTED NOT DETECTED Final   Entamoeba histolytica NOT DETECTED NOT DETECTED Final   Giardia lamblia NOT DETECTED NOT DETECTED Final   Adenovirus F40/41 NOT DETECTED NOT DETECTED Final   Astrovirus NOT DETECTED NOT DETECTED Final   Norovirus GI/GII NOT DETECTED NOT DETECTED Final   Rotavirus A NOT DETECTED NOT DETECTED Final   Sapovirus (I, II, IV, and V) NOT DETECTED NOT DETECTED Final    Comment: Performed at [facility], [address] [place], [place] [address]  C Difficile Quick Screen w PCR reflex     Status: None   Collection Time: 09/21/21 11:30 AM   Specimen: STOOL  Result Value Ref Range Status   C Diff antigen NEGATIVE NEGATIVE Final   C Diff toxin NEGATIVE NEGATIVE Final   C Diff interpretation No C. difficile detected.  Final    Comment: Performed at [facility], [address] [place], [place] [address]  Culture, blood (Routine X 2) w Reflex to ID Panel     Status: None   Collection Time: 09/21/21 12:28 PM   Specimen: BLOOD  Result Value Ref Range Status   Specimen Description BLOOD BLOOD RIGHT HAND  Final   Special Requests   Final    BOTTLES DRAWN AEROBIC AND ANAEROBIC Blood Culture adequate volume   Culture   Final    NO GROWTH 5 DAYS Performed at [facility], [address] [place], [place] [address]    Report Status 09/26/2021 FINAL  Final  Culture, blood (Routine X 2) w Reflex to ID Panel     Status: None   Collection Time: 09/21/21  2:08 PM   Specimen: BLOOD  Result Value Ref Range Status   Specimen Description BLOOD BLOOD LEFT HAND  Final   Special Requests   Final    BOTTLES  DRAWN AEROBIC  AND ANAEROBIC Blood Culture adequate volume   Culture   Final    NO GROWTH 5 DAYS Performed at [facility], [address] [place], [place] [address]    Report Status 09/26/2021 FINAL  Final  Respiratory (~20 pathogens) panel by PCR     Status: None   Collection Time: 09/21/21  6:14 PM   Specimen: Nasopharyngeal Swab; Respiratory  Result Value Ref Range Status   Adenovirus NOT DETECTED NOT DETECTED Final   Coronavirus 229E NOT DETECTED NOT DETECTED Final    Comment: (NOTE) The Coronavirus on the Respiratory Panel, DOES NOT test for the novel  Coronavirus (2019 nCoV)    Coronavirus HKU1 NOT DETECTED NOT DETECTED Final   Coronavirus NL63 NOT DETECTED NOT DETECTED Final   Coronavirus OC43 NOT DETECTED NOT DETECTED Final   Metapneumovirus NOT DETECTED NOT DETECTED Final   Rhinovirus / Enterovirus NOT DETECTED NOT DETECTED Final   Influenza A NOT DETECTED NOT DETECTED Final   Influenza B NOT DETECTED NOT DETECTED Final   Parainfluenza Virus 1 NOT DETECTED NOT DETECTED Final   Parainfluenza Virus 2 NOT DETECTED NOT DETECTED Final   Parainfluenza Virus 3 NOT DETECTED NOT DETECTED Final   Parainfluenza Virus 4 NOT DETECTED NOT DETECTED Final   Respiratory Syncytial Virus NOT DETECTED NOT DETECTED Final   Bordetella pertussis NOT DETECTED NOT DETECTED Final   Bordetella Parapertussis NOT DETECTED NOT DETECTED Final   Chlamydophila pneumoniae NOT DETECTED NOT DETECTED Final   Mycoplasma pneumoniae NOT DETECTED NOT DETECTED Final    Comment: Performed at [facility], [place] [address], [place], [place] [address]    Coagulation Studies: Recent Labs    10/03/21 1250  LABPROT 15.0  INR 1.2     Urinalysis: No results for input(s): "COLORURINE", "LABSPEC", "PHURINE", "GLUCOSEU", "HGBUR", "BILIRUBINUR", "KETONESUR", "PROTEINUR", "UROBILINOGEN", "NITRITE", "LEUKOCYTESUR" in the last 72 hours.  Invalid input(s): "APPERANCEUR"    Imaging: DG Chest Port 1 View  Result  Date: 10/03/2021 CLINICAL DATA:  Hypoxia. EXAM: PORTABLE CHEST 1 VIEW COMPARISON:  October 02, 2021. FINDINGS: Stable cardiomegaly. Tracheostomy tube is in grossly good position. Right internal jugular catheter is unchanged. Bibasilar opacities are again noted concerning for atelectasis, infiltrates or possibly edema. Old left rib fractures are again noted. IMPRESSION: Bibasilar opacities are again noted concerning for atelectasis, infiltrates or possibly edema. Electronically Signed   By: Marijo Conception M.D.   On: 10/03/2021 09:48   DG Chest Port 1 View  Result Date: 10/02/2021 CLINICAL DATA:  Acute on chronic respiratory failure EXAM: PORTABLE CHEST 1 VIEW COMPARISON:  High radiograph 09/26/2021 FINDINGS: Endotracheal tube overlies the midthoracic trachea. Right neck catheter tip overlies the mid superior vena cava. Orogastric tube passes below the diaphragm, tip excluded by collimation. Unchanged, enlarged cardiomediastinal silhouette. There is pulmonary vascular congestion with interstitial opacities bilaterally, similar to prior. There are bibasilar opacities. No pneumothorax. No large effusion. No acute osseous abnormality. IMPRESSION: No significant change in bilateral interstitial and airspace disease. Electronically Signed   By: Maurine Simmering M.D.   On: 10/02/2021 13:53     Medications:    sodium chloride Stopped (10/04/21 0208)   feeding supplement (NEPRO CARB STEADY) Stopped (10/03/21 0800)   fentaNYL infusion INTRAVENOUS 175 mcg/hr (10/04/21 0800)   midazolam 5 mg/hr (10/04/21 0800)    sodium chloride   Intravenous Once   bisacodyl  10 mg Rectal Once   budesonide (PULMICORT) nebulizer solution  0.5 mg Nebulization BID   Chlorhexidine Gluconate Cloth  6 each Topical Daily   clonazepam  0.5 mg Per Tube BID   feeding supplement (PROSource TF)  90 mL Per Tube TID   fentaNYL (SUBLIMAZE) injection  50 mcg Intravenous Once   free water  30 mL Per Tube Q4H   heparin sodium (porcine)        insulin aspart  0-15 Units Subcutaneous Q4H   ipratropium-albuterol  3 mL Nebulization BID   multivitamin  1 tablet Per Tube QHS   nutrition supplement (JUVEN)  1 packet Per Tube BID BM   mouth rinse  15 mL Mouth Rinse Q2H   oxyCODONE  15 mg Per Tube Q8H   [START ON 10/07/2021] pantoprazole  40 mg Intravenous Q12H   polyethylene glycol  17 g Per Tube BID   traZODone  100 mg Per Tube QHS   acetaminophen, acetaminophen, albuterol, alteplase, bisacodyl, fentaNYL, guaiFENesin-dextromethorphan, heparin, heparin sodium (porcine), hydrALAZINE, lidocaine (PF), lidocaine-prilocaine, midazolam, mouth rinse, pentafluoroprop-tetrafluoroeth  Assessment/ Plan:  Gary [name] is a 66 y.o.  male with past medical conditions including COPD requiring 2 L nasal cannula, and congestive heart failure.  Patient presents to the emergency department with shortness of breath.  Patient has been admitted for COPD exacerbation ([place]) [J44.1] Obesity hypoventilation syndrome ([place]) [E66.2] Acute on chronic diastolic congestive heart failure ([name]) [I50.33] Acute respiratory failure with hypoxia and hypercapnia ([name]) [J96.01, J96.02] Acute on chronic respiratory failure with hypoxia and hypercapnia ([name]) [U38.45, J96.22]    Acute Kidney Injury with hyperkalemia, cause unidentified.  No IV contrast exposure and brief hypotensive event. Normal renal function 2 days prior.Renal Ultrasound shows mild right obstruction and simple cyst in  left kidney.   -Creatinine increased today with decrease in urine output despite IV furosemide 40 mg given yesterday -Will receive scheduled dialysis today, no UF.  We will continue to monitor daily need for dialysis. -Potassium corrected with dialysis  Lab Results  Component Value Date   CREATININE 3.77 (H) 10/04/2021   CREATININE 2.44 (H) 10/03/2021   CREATININE 2.40 (H) 10/02/2021    Intake/Output Summary (Last 24 hours) at 10/04/2021 1053 Last data filed at 10/04/2021 0800 Gross  per 24 hour  Intake 525.87 ml  Output 850 ml  Net -324.13 ml    Acute respiratory failure Ventilator assisted, trach placed today. FiO2 40% Completed IV antibiotic therapy Pseudomonas noted in sputum on September 19, 2021   Chronic diastolic heart failure.  Echo from 08/07/2021 shows EF 60 to 65% with a mildly dilated left ventricular cavity and grade 1 diastolic dysfunction. Home regimen of aspirin, carvedilol, Farxiga, simvastatin, torsemide All on hold at present.  Given as needed doses of IV furosemide.    LOS: [place] 6/21/202310:53 AM

## 2021-10-04 NOTE — Progress Notes (Signed)
Jonathon Bellows , MD [address], [place], [place], [place], [address] [address], [place], [place], [place], [address] Phone: [phone]  Fax: [phone]   GERSHOM BROBECK is being followed for melena Day 1 of follow up   Subjective: Patient intubated sedated no history from patient discussed with the nursing some dark stool.  No more epistaxis or hematemesis   Objective: Vital signs in last 24 hours: Vitals:   10/04/21 0800 10/04/21 0830 10/04/21 0900 10/04/21 0930  BP: 103/65 109/69 120/65   Pulse: 82 81 87 83  Resp:      Temp: (!) 97.5 F (36.4 C)     TempSrc: Axillary     SpO2: 96% 100% 93% 94%  Weight:      Height:       Weight change: 0 kg  Intake/Output Summary (Last 24 hours) at 10/04/2021 1121 Last data filed at 10/04/2021 0800 Gross per 24 hour  Intake 525.87 ml  Output 850 ml  Net -324.13 ml     Exam: Intubated and sedated   Lab Results: '@LABTEST2'$ @ Micro Results: No results found for this or any previous visit (from the past 240 hour(s)). Studies/Results: DG Chest Port 1 View  Result Date: 10/03/2021 CLINICAL DATA:  Hypoxia. EXAM: PORTABLE CHEST 1 VIEW COMPARISON:  October 02, 2021. FINDINGS: Stable cardiomegaly. Tracheostomy tube is in grossly good position. Right internal jugular catheter is unchanged. Bibasilar opacities are again noted concerning for atelectasis, infiltrates or possibly edema. Old left rib fractures are again noted. IMPRESSION: Bibasilar opacities are again noted concerning for atelectasis, infiltrates or possibly edema. Electronically Signed   By: Marijo Conception M.D.   On: 10/03/2021 09:48   DG Chest Port 1 View  Result Date: 10/02/2021 CLINICAL DATA:  Acute on chronic respiratory failure EXAM: PORTABLE CHEST 1 VIEW COMPARISON:  High radiograph 09/26/2021 FINDINGS: Endotracheal tube overlies the midthoracic trachea. Right neck catheter tip overlies the mid superior vena cava. Orogastric tube passes below the diaphragm, tip  excluded by collimation. Unchanged, enlarged cardiomediastinal silhouette. There is pulmonary vascular congestion with interstitial opacities bilaterally, similar to prior. There are bibasilar opacities. No pneumothorax. No large effusion. No acute osseous abnormality. IMPRESSION: No significant change in bilateral interstitial and airspace disease. Electronically Signed   By: Maurine Simmering M.D.   On: 10/02/2021 13:53   Medications: I have reviewed the patient's current medications. Scheduled Meds:  sodium chloride   Intravenous Once   bisacodyl  10 mg Rectal Once   budesonide (PULMICORT) nebulizer solution  0.5 mg Nebulization BID   Chlorhexidine Gluconate Cloth  6 each Topical Daily   clonazepam  0.5 mg Per Tube BID   feeding supplement (PROSource TF)  90 mL Per Tube TID   fentaNYL (SUBLIMAZE) injection  50 mcg Intravenous Once   free water  30 mL Per Tube Q4H   heparin sodium (porcine)       insulin aspart  0-15 Units Subcutaneous Q4H   ipratropium-albuterol  3 mL Nebulization BID   multivitamin  1 tablet Per Tube QHS   nutrition supplement (JUVEN)  1 packet Per Tube BID BM   mouth rinse  15 mL Mouth Rinse Q2H   oxyCODONE  15 mg Per Tube Q8H   [START ON 10/07/2021] pantoprazole  40 mg Intravenous Q12H   polyethylene glycol  17 g Per Tube BID   traZODone  100 mg Per Tube QHS   Continuous Infusions:  sodium chloride Stopped (10/04/21 0208)   feeding supplement (NEPRO  CARB STEADY) Stopped (10/03/21 0800)   fentaNYL infusion INTRAVENOUS 175 mcg/hr (10/04/21 0800)   midazolam 5 mg/hr (10/04/21 0800)   PRN Meds:.acetaminophen, acetaminophen, albuterol, alteplase, bisacodyl, fentaNYL, guaiFENesin-dextromethorphan, heparin, heparin sodium (porcine), hydrALAZINE, lidocaine (PF), lidocaine-prilocaine, midazolam, mouth rinse, pentafluoroprop-tetrafluoroeth   Assessment: Principal Problem:   Acute on chronic respiratory failure with hypoxia and hypercapnia ([name]) Active Problems:   Obesity,  Class III, BMI 40-49.9 (morbid obesity) ([name])   HTN (hypertension)   Acute on chronic diastolic congestive heart failure ([name])   Obstructive sleep apnea   Acute metabolic encephalopathy   COPD with acute exacerbation ([name])   Chronic kidney disease, stage 3a ([place])   HLD (hyperlipidemia)   Pressure injury of skin  JAKALEB PAYER is a 66 y.o. y/o male with a history of hypoxic respiratory failure secondary to COPD status post tracheostomy today subsequently had traumatic injury to placement with epistaxis, hematochezia and then melena.Likely related to traumatic NG tube placement.  Hemoglobin 1 g drop from baseline and since stable.  Decrease in the quantity of blood in the stool now has dark stool likely acute clearing up old blood since hemoglobin is stable will not proceed with endoscopy at this time we will watch for next any 4 hours if there is a drop in hemoglobin we will proceed with EGD tomorrow otherwise can avoid as this is most likely related to NG tube trauma.  If there is active bleeding consider CT angiogram.   I have discussed this plan with Dr. Joeseph Amor   LOS: 53 days   Jonathon Bellows, MD 10/04/2021, 11:21 AM

## 2021-10-04 NOTE — Progress Notes (Signed)
Nutrition Follow Up Note   DOCUMENTATION CODES:   Morbid obesity  INTERVENTION:   Once enteral access gained, recommend:  Continue Nepro @70ml/hr + ProSource TF 90ml TID via tube   Free water flushes 30ml q4 hours to maintain tube patency   Regimen provides 3264kcal/day, 203g/day protein and 1401ml/day of free water.   Cholecalciferol 2000 units daily via tube   Juven Fruit Punch BID via tube, each serving provides 95kcal and 2.5g of protein (amino acids glutamine and arginine)  Rena-vit daily via tube   NUTRITION DIAGNOSIS:   Inadequate oral intake related to inability to eat (pt sedated and ventilated) as evidenced by NPO status.  GOAL:   Provide needs based on ASPEN/SCCM guidelines -previously met with tube feeds   MONITOR:   Vent status, Labs, Weight trends, TF tolerance, Skin, I & O's  ASSESSMENT:   65 y/o male with h/o PUD, OSA, HTN, CKD III, substance abuse, CHF, Hep C and COPD and COVID 19 (02/2021) who is admitted with COPD exacerbation, CHF and AKI. 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  Pt s/p tracheostomy 6/20  Pt remains ventilated via trach. NGT unable to be placed by nursing and resulted in traumatic nosebleed with resulting melena. Plan today is for IR placement of NGT r/t pt's trach and difficult anatomy. Will plan to resume tube feeds once NGT in place; pt may require G-tube if mental status does not improve. Rectal tube placed today. Per chart, pt is down 19lbs(5%) since admission. Pt +10L on his I & Os.   Medications reviewed and include: heparin, insulin, rena-vit, juven, oxycodone, protonix, miralax, levophed   Labs reviewed: K 4.2 wnl, BUN 98(H), creat 3.77(H), P 5.8(H), Mg 2.0 wnl Vitamin D- 13.06(L)- 1/20 Wbc- 11.4(H), Hgb 8.9(L), Hct 29.9(L) Cbgs- 85, 131, 68, 74 x 24 hrs  Patient is currently intubated on ventilator support MV: 9.9 L/min Temp (24hrs), Avg:98.2 F (36.8 C), Min:97.3 F (36.3 C), Max:98.8 F (37.1  C)  Propofol: none   MAP- >65mmHg   UOP- 850ml/day   Diet Order:    Diet Order             Diet NPO time specified  Diet effective midnight                  EDUCATION NEEDS:   No education needs have been identified at this time  Skin:  Skin Assessment: Reviewed RN Assessment (Stage II perineum, MASD)  Last BM:  6/21- TYPE 7  Height:   Ht Readings from Last 1 Encounters:  09/28/21 6' 3" (1.905 m)    Weight:   Wt Readings from Last 1 Encounters:  10/04/21 (!) 162 kg    Ideal Body Weight:  89 kg  BMI:  Body mass index is 44.64 kg/m.  Estimated Nutritional Needs:   Kcal:  3000-3300kcal/day  Protein:  178-225g/day  Fluid:  2.0L/day    MS, RD, LDN Please refer to AMION for RD and/or RD on-call/weekend/after hours pager 

## 2021-10-04 NOTE — Progress Notes (Signed)
Pt on HD, metaneb not done at this time.
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
## 2021-10-04 NOTE — Progress Notes (Signed)
Hemodialysis Post Treatment Note:  Tx date:10/04/2021 Tx time: 1 hour and 30 minutes Access: right cvc UF Removed: No fluid removed  Note:  PRE HD BP 130/79 16:07 HD started. CVC can only tolerate BFR of 350. Lines reversed, still high venous pressure. BFR   lowered again to 300 then to 250.  16:45 BP Low 88/57  100cc NSS flushed repeat BP still low at 83/51. Flushed another 100 cc NSS  Repeat BP 111/69  16:49 DR Candiss Norse notified with the BFR and BP trend with order to give another 200cc NSS- given BP 112/68 17:53 HD treatment terminated due clot on chambers and unable to return blood. Total blood loss is about 250 ml. DR Candiss Norse was notified. Post HD BP 118/70

## 2021-10-04 NOTE — Progress Notes (Addendum)
Patient severely encephalopatic overnight. Restarted versed drip initially and fentanyl as per discussion with NP. 2 episodes of melena overnight. Bleeding from nasopharynx seems stable. Hemoglobin stable >8 mg/dl.

## 2021-10-05 ENCOUNTER — Inpatient Hospital Stay: Payer: Medicaid Other

## 2021-10-05 DIAGNOSIS — J9621 Acute and chronic respiratory failure with hypoxia: Secondary | ICD-10-CM | POA: Diagnosis not present

## 2021-10-05 DIAGNOSIS — J9622 Acute and chronic respiratory failure with hypercapnia: Secondary | ICD-10-CM | POA: Diagnosis not present

## 2021-10-05 DIAGNOSIS — K921 Melena: Secondary | ICD-10-CM | POA: Diagnosis not present

## 2021-10-05 LAB — TYPE AND SCREEN
ABO/RH(D): A NEG
Antibody Screen: NEGATIVE
Unit division: 0
Unit division: 0

## 2021-10-05 LAB — BASIC METABOLIC PANEL
Anion gap: 7 (ref 5–15)
BUN: 100 mg/dL — ABNORMAL HIGH (ref 8–23)
CO2: 27 mmol/L (ref 22–32)
Calcium: 8.1 mg/dL — ABNORMAL LOW (ref 8.9–10.3)
Chloride: 102 mmol/L (ref 98–111)
Creatinine, Ser: 3.86 mg/dL — ABNORMAL HIGH (ref 0.61–1.24)
GFR, Estimated: 17 mL/min — ABNORMAL LOW (ref >60)
Glucose, Bld: 94 mg/dL (ref 70–99)
Potassium: 3.4 mmol/L — ABNORMAL LOW (ref 3.5–5.1)
Sodium: 136 mmol/L (ref 135–145)

## 2021-10-05 LAB — MAGNESIUM: Magnesium: 1.9 mg/dL (ref 1.7–2.4)

## 2021-10-05 LAB — CBC
HCT: 28 % — ABNORMAL LOW (ref 39.0–52.0)
Hemoglobin: 8.2 g/dL — ABNORMAL LOW (ref 13.0–17.0)
MCH: 22.5 pg — ABNORMAL LOW (ref 26.0–34.0)
MCHC: 29.3 g/dL — ABNORMAL LOW (ref 30.0–36.0)
MCV: 76.9 fL — ABNORMAL LOW (ref 80.0–100.0)
Platelets: 144 10*3/uL — ABNORMAL LOW (ref 150–400)
RBC: 3.64 MIL/uL — ABNORMAL LOW (ref 4.22–5.81)
RDW: 16.5 % — ABNORMAL HIGH (ref 11.5–15.5)
WBC: 7.1 10*3/uL (ref 4.0–10.5)
nRBC: 0 % (ref 0.0–0.2)

## 2021-10-05 LAB — BPAM RBC
Blood Product Expiration Date: 202307182359
Blood Product Expiration Date: 202307182359
Unit Type and Rh: 5100
Unit Type and Rh: 5100

## 2021-10-05 LAB — GLUCOSE, CAPILLARY
Glucose-Capillary: 65 mg/dL — ABNORMAL LOW (ref 70–99)
Glucose-Capillary: 73 mg/dL (ref 70–99)
Glucose-Capillary: 74 mg/dL (ref 70–99)
Glucose-Capillary: 77 mg/dL (ref 70–99)
Glucose-Capillary: 78 mg/dL (ref 70–99)
Glucose-Capillary: 97 mg/dL (ref 70–99)

## 2021-10-05 LAB — HEMOGLOBIN AND HEMATOCRIT, BLOOD
HCT: 28 % — ABNORMAL LOW (ref 39.0–52.0)
Hemoglobin: 8.2 g/dL — ABNORMAL LOW (ref 13.0–17.0)

## 2021-10-05 LAB — PREPARE RBC (CROSSMATCH)

## 2021-10-05 LAB — PHOSPHORUS: Phosphorus: 5 mg/dL — ABNORMAL HIGH (ref 2.5–4.6)

## 2021-10-05 MED ORDER — CLONAZEPAM 0.5 MG PO TBDP
0.5000 mg | ORAL_TABLET | Freq: Once | ORAL | Status: AC
  Administered 2021-10-05: 0.5 mg
  Filled 2021-10-05: qty 1

## 2021-10-05 MED ORDER — FUROSEMIDE 10 MG/ML IJ SOLN
80.0000 mg | Freq: Once | INTRAMUSCULAR | Status: AC
Start: 2021-10-05 — End: 2021-10-05
  Administered 2021-10-05: 80 mg via INTRAVENOUS
  Filled 2021-10-05: qty 8

## 2021-10-05 MED ORDER — HEPARIN SODIUM (PORCINE) 5000 UNIT/ML IJ SOLN
5000.0000 [IU] | Freq: Three times a day (TID) | INTRAMUSCULAR | Status: DC
  Administered 2021-10-05 – 2021-10-12 (×20): 5000 [IU] via SUBCUTANEOUS
  Filled 2021-10-05 (×20): qty 1

## 2021-10-05 MED ORDER — FUROSEMIDE 10 MG/ML IJ SOLN
100.0000 mg | Freq: Once | INTRAMUSCULAR | Status: AC
  Administered 2021-10-05: 100 mg via INTRAVENOUS
  Filled 2021-10-05: qty 10

## 2021-10-05 MED ORDER — TRAZODONE HCL 50 MG PO TABS
50.0000 mg | ORAL_TABLET | Freq: Every day | ORAL | Status: DC
Start: 2021-10-05 — End: 2021-10-06
  Administered 2021-10-05: 50 mg
  Filled 2021-10-05: qty 1

## 2021-10-05 MED ORDER — METOLAZONE 5 MG PO TABS
5.0000 mg | ORAL_TABLET | Freq: Once | ORAL | Status: AC
Start: 2021-10-05 — End: 2021-10-05
  Administered 2021-10-05: 5 mg
  Filled 2021-10-05: qty 1

## 2021-10-05 MED ORDER — ASPIRIN 81 MG PO CHEW
81.0000 mg | CHEWABLE_TABLET | Freq: Every day | ORAL | Status: DC
  Administered 2021-10-06 – 2021-10-10 (×5): 81 mg
  Filled 2021-10-05 (×5): qty 1

## 2021-10-05 MED ORDER — ATORVASTATIN CALCIUM 20 MG PO TABS
10.0000 mg | ORAL_TABLET | Freq: Every day | ORAL | Status: DC
Start: 2021-10-06 — End: 2021-11-07
  Administered 2021-10-06 – 2021-11-06 (×25): 10 mg
  Filled 2021-10-05 (×26): qty 1
  Filled 2021-10-05: qty 0.5

## 2021-10-05 MED ORDER — DEXTROSE 50 % IV SOLN
12.5000 g | INTRAVENOUS | Status: AC
  Administered 2021-10-05: 12.5 g via INTRAVENOUS
  Filled 2021-10-05: qty 50

## 2021-10-05 MED ORDER — OXYCODONE HCL 5 MG/5ML PO SOLN
10.0000 mg | Freq: Three times a day (TID) | ORAL | Status: DC
  Administered 2021-10-05 – 2021-10-16 (×26): 10 mg
  Filled 2021-10-05 (×27): qty 10

## 2021-10-05 NOTE — Progress Notes (Signed)
[place] for Electrolyte Monitoring and Replacement   Recent Labs: Potassium (mmol/L)  Date Value  10/05/2021 3.4 (L)  04/15/2014 3.9   Magnesium (mg/dL)  Date Value  10/05/2021 1.9  08/21/2012 1.9   Calcium (mg/dL)  Date Value  10/05/2021 8.1 (L)   Calcium, Total (mg/dL)  Date Value  04/15/2014 8.3 (L)   Albumin (g/dL)  Date Value  10/02/2021 2.3 (L)  04/15/2014 2.9 (L)   Phosphorus (mg/dL)  Date Value  10/05/2021 5.0 (H)   Sodium (mmol/L)  Date Value  10/05/2021 136  03/15/2020 145 (H)  04/15/2014 140   Assessment: 66 y/o male with h/o PUD, OSA, HTN, CKD III, substance abuse, CHF, Hep C and COPD and COVID 19 (02/2021) who is admitted with COPD exacerbation, Pseudomonas pneumonia on cefepime, HFpEF, and AKI. Intubated and sedated on fentanyl and Precedex. Pharmacy is asked to follow and replace electrolytes while in CCU.  New start HD patient. Nephrology consulted. Next HD 6/21  Goal of Therapy:  Electrolytes within normal limits  Plan: K 3.4 --Phos elevated defer management to PCCM / nephrology --Follow-up electrolytes with AM labs tomorrow  Wynelle Cleveland, PharmD Pharmacy Resident  10/05/2021 7:27 AM

## 2021-10-05 NOTE — Progress Notes (Signed)
[facility]  ROUNDING NOTE   Subjective:   LACOREY BRUSCA is a 66 year old male with past medical conditions including COPD requiring 2 L nasal cannula, and congestive heart failure.  Patient presents to the emergency department with shortness of breath.  Patient has been admitted for COPD exacerbation ([place]) [J44.1] Obesity hypoventilation syndrome ([place]) [E66.2] Acute on chronic diastolic congestive heart failure ([name]) [I50.33] Acute respiratory failure with hypoxia and hypercapnia ([name]) [J96.01, J96.02] Acute on chronic respiratory failure with hypoxia and hypercapnia ([name]) [E42.35, J96.22]  Patient is known and has recently been followed by Dr. Holley Raring for acute kidney injury, resolved at his May appointment.    Patient critically ill Sedation: Fentanyl and versed Trach with vent support-50% FiO2 Tube feeds 70 ml.hr  Generalized edema Foley catheter 1L   Objective:  Vital signs in last 24 hours:  Temp:  [97.4 F (36.3 C)-98.1 F (36.7 C)] 97.5 F (36.4 C) (06/22 1205) Pulse Rate:  [73-91] 87 (06/22 1205) Resp:  [16-22] 20 (06/22 1205) BP: (80-130)/(47-79) 112/68 (06/22 1200) SpO2:  [86 %-100 %] 94 % (06/22 1205) FiO2 (%):  [50 %] 50 % (06/22 1205)  Weight change:  Filed Weights   10/04/21 0351  Weight: (!) 162 kg    Intake/Output: I/O last 3 completed shifts: In: 796.3 [I.V.:796.3] Out: 1008 [Urine:1500]   Intake/Output this shift:  Total I/O In: 86.1 [I.V.:43.1; IV Piggyback:43] Out: 240 [Urine:240]  Physical Exam: General: Critically ill-appearing, morbidly obese gentleman, BMI of 42  Head: Normocephalic, atraumatic.   Eyes: Anicteric  Lungs:  Rhonchi throughout, vent and trach placed 10/03/21  Heart: Regular rhythm, tachycardic  Abdomen:  Soft, nontender, obese  Extremities: 2+ peripheral edema.  Neurologic: Sedated  Skin: No lesions  Access: Right IJ temp cath    Basic Metabolic Panel: Recent Labs  Lab 10/01/21 0526 10/01/21 1436  10/02/21 0349 10/02/21 2152 10/03/21 0505 10/04/21 0447 10/05/21 0143  NA 139 138 139 137 138 138 136  K 3.8 4.0 4.2 3.7 4.1 4.2 3.4*  CL 102 102 101 100 101 103 102  CO2 '31 28 28 29 28 26 27  '$ GLUCOSE 97 132* 106* 123* 106* 84 94  BUN 78* 95* 90* 78* 84* 98* 100*  CREATININE 2.37* 2.77* 3.21* 2.40* 2.44* 3.77* 3.86*  CALCIUM 8.0* 8.3* 8.3* 8.4* 8.6* 8.4* 8.1*  MG 1.7 2.3 2.2  --  2.0 2.0 1.9  PHOS 5.1*  --  5.4*  --  5.9* 5.8* 5.0*     Liver Function Tests: Recent Labs  Lab 10/02/21 0349  AST 13*  ALT 18  ALKPHOS 64  BILITOT 0.5  PROT 7.1  ALBUMIN 2.3*    No results for input(s): "LIPASE", "AMYLASE" in the last 168 hours. No results for input(s): "AMMONIA" in the last 168 hours.  CBC: Recent Labs  Lab 10/02/21 0349 10/03/21 0505 10/03/21 1251 10/03/21 1814 10/04/21 0447 10/04/21 1203 10/04/21 1936 10/05/21 0143 10/05/21 0959  WBC 11.9* 14.4* 12.8*  --  11.4*  --   --  7.1  --   NEUTROABS 9.9* 12.1*  --   --   --   --   --   --   --   HGB 10.4* 10.4* 9.4*   < > 9.1* 8.9* 8.7* 8.2* 8.2*  HCT 35.9* 36.6* 33.4*   < > 31.3* 29.9* 30.0* 28.0* 28.0*  MCV 78.6* 79.9* 80.7  --  78.4*  --   --  76.9*  --   PLT 179 155 146*  --  143*  --   --  144*  --    < > = values in this interval not displayed.     Cardiac Enzymes: No results for input(s): "CKTOTAL", "CKMB", "CKMBINDEX", "TROPONINI" in the last 168 hours.  BNP: Invalid input(s): "POCBNP"  CBG: Recent Labs  Lab 10/04/21 1927 10/04/21 2333 10/05/21 0338 10/05/21 0743 10/05/21 1146  GLUCAP 79 69* 65* 74 4     Microbiology: Results for orders placed or performed during the hospital encounter of 09/19/21  SARS Coronavirus 2 by RT PCR (hospital order, performed in [facility] hospital lab) *cepheid single result test* Anterior Nasal Swab     Status: None   Collection Time: 09/19/21  9:57 AM   Specimen: Anterior Nasal Swab  Result Value Ref Range Status   SARS Coronavirus 2 by RT PCR NEGATIVE  NEGATIVE Final    Comment: (NOTE) SARS-CoV-2 target nucleic acids are NOT DETECTED.  The SARS-CoV-2 RNA is generally detectable in upper and lower respiratory specimens during the acute phase of infection. The lowest concentration of SARS-CoV-2 viral copies this assay can detect is 250 copies / mL. A negative result does not preclude SARS-CoV-2 infection and should not be used as the sole basis for treatment or other patient management decisions.  A negative result may occur with improper specimen collection / handling, submission of specimen other than nasopharyngeal swab, presence of viral mutation(s) within the areas targeted by this assay, and inadequate number of viral copies (<250 copies / mL). A negative result must be combined with clinical observations, patient history, and epidemiological information.  Fact Sheet for Patients:   https://www.patel.info/  Fact Sheet for Healthcare Providers: https://hall.com/  This test is not yet approved or  cleared by the [place] FDA and has been authorized for detection and/or diagnosis of SARS-CoV-2 by FDA under an Emergency Use Authorization (EUA).  This EUA will remain in effect (meaning this test can be used) for the duration of the COVID-19 declaration under Section 564(b)(1) of the Act, 21 U.S.C. section 360bbb-3(b)(1), unless the authorization is terminated or revoked sooner.  Performed at [facility], [address] [place], [place] [address]   Culture, Respiratory w Gram Stain     Status: None   Collection Time: 09/19/21 11:55 AM   Specimen: SPU  Result Value Ref Range Status   Specimen Description   Final    SPUTUM Performed at [facility], [place] [address] [place], [place] [address]    Special Requests   Final    NONE Performed at [facility], [place], [place] [address]    Gram Stain   Final    FEW WBC PRESENT,  PREDOMINANTLY PMN FEW GRAM POSITIVE COCCI IN PAIRS Performed at [facility], [place] [address], [place], [place] [address]    Culture RARE [name]  Final   Report Status 09/24/2021 FINAL  Final   Organism ID, Bacteria [name]  Final      Susceptibility   [name] - MIC*    CEFTAZIDIME 4 SENSITIVE Sensitive     CIPROFLOXACIN <=0.25 SENSITIVE Sensitive     GENTAMICIN 2 SENSITIVE Sensitive     IMIPENEM 1 SENSITIVE Sensitive     PIP/TAZO 8 SENSITIVE Sensitive     CEFEPIME 2 SENSITIVE Sensitive     * RARE [name]  MRSA Next Gen by PCR, Nasal     Status: None   Collection Time: 09/19/21  6:10 PM  Specimen: Nasal Mucosa; Nasal Swab  Result Value Ref Range Status   MRSA by PCR Next Gen NOT DETECTED NOT DETECTED Final    Comment: (NOTE) The GeneXpert MRSA Assay (FDA approved for NASAL specimens only), is one component of a comprehensive MRSA colonization surveillance program. It is not intended to diagnose MRSA infection nor to guide or monitor treatment for MRSA infections. Test performance is not FDA approved in patients less than 75 years old. Performed at [facility], [address] [place], [place] [address]   Gastrointestinal Panel by PCR , Stool     Status: Abnormal   Collection Time: 09/21/21 10:53 AM   Specimen: Stool  Result Value Ref Range Status   Campylobacter species NOT DETECTED NOT DETECTED Final   Plesimonas shigelloides NOT DETECTED NOT DETECTED Final   Salmonella species NOT DETECTED NOT DETECTED Final   Yersinia enterocolitica DETECTED (A) NOT DETECTED Final    Comment: RESULT CALLED TO, READ BACK BY AND VERIFIED WITH: [facility] JACKSON AT 1126 09/22/21.PMF    Vibrio species NOT DETECTED NOT DETECTED Final   Vibrio cholerae NOT DETECTED NOT DETECTED Final   Enteroaggregative E coli (EAEC) NOT DETECTED NOT DETECTED Final   Enteropathogenic E coli (EPEC) NOT DETECTED NOT DETECTED Final    Enterotoxigenic E coli (ETEC) NOT DETECTED NOT DETECTED Final   Shiga like toxin producing E coli (STEC) NOT DETECTED NOT DETECTED Final   Shigella/Enteroinvasive E coli (EIEC) NOT DETECTED NOT DETECTED Final   Cryptosporidium NOT DETECTED NOT DETECTED Final   Cyclospora cayetanensis NOT DETECTED NOT DETECTED Final   Entamoeba histolytica NOT DETECTED NOT DETECTED Final   Giardia lamblia NOT DETECTED NOT DETECTED Final   Adenovirus F40/41 NOT DETECTED NOT DETECTED Final   Astrovirus NOT DETECTED NOT DETECTED Final   Norovirus GI/GII NOT DETECTED NOT DETECTED Final   Rotavirus A NOT DETECTED NOT DETECTED Final   Sapovirus (I, II, IV, and V) NOT DETECTED NOT DETECTED Final    Comment: Performed at [facility], [address] [place], [place] [address]  C Difficile Quick Screen w PCR reflex     Status: None   Collection Time: 09/21/21 11:30 AM   Specimen: STOOL  Result Value Ref Range Status   C Diff antigen NEGATIVE NEGATIVE Final   C Diff toxin NEGATIVE NEGATIVE Final   C Diff interpretation No C. difficile detected.  Final    Comment: Performed at [facility], [address] [place], [place] [address]  Culture, blood (Routine X 2) w Reflex to ID Panel     Status: None   Collection Time: 09/21/21 12:28 PM   Specimen: BLOOD  Result Value Ref Range Status   Specimen Description BLOOD BLOOD RIGHT HAND  Final   Special Requests   Final    BOTTLES DRAWN AEROBIC AND ANAEROBIC Blood Culture adequate volume   Culture   Final    NO GROWTH 5 DAYS Performed at [facility], [address] [place], [place] [address]    Report Status 09/26/2021 FINAL  Final  Culture, blood (Routine X 2) w Reflex to ID Panel     Status: None   Collection Time: 09/21/21  2:08 PM   Specimen: BLOOD  Result Value Ref Range Status   Specimen Description BLOOD BLOOD LEFT HAND  Final   Special Requests   Final    BOTTLES DRAWN AEROBIC AND ANAEROBIC Blood Culture adequate volume    Culture   Final    NO GROWTH 5  DAYS Performed at [facility], [address] [place], [place] [address]    Report Status 09/26/2021 FINAL  Final  Respiratory (~20 pathogens) panel by PCR     Status: None   Collection Time: 09/21/21  6:14 PM   Specimen: Nasopharyngeal Swab; Respiratory  Result Value Ref Range Status   Adenovirus NOT DETECTED NOT DETECTED Final   Coronavirus 229E NOT DETECTED NOT DETECTED Final    Comment: (NOTE) The Coronavirus on the Respiratory Panel, DOES NOT test for the novel  Coronavirus (2019 nCoV)    Coronavirus HKU1 NOT DETECTED NOT DETECTED Final   Coronavirus NL63 NOT DETECTED NOT DETECTED Final   Coronavirus OC43 NOT DETECTED NOT DETECTED Final   Metapneumovirus NOT DETECTED NOT DETECTED Final   Rhinovirus / Enterovirus NOT DETECTED NOT DETECTED Final   Influenza A NOT DETECTED NOT DETECTED Final   Influenza B NOT DETECTED NOT DETECTED Final   Parainfluenza Virus 1 NOT DETECTED NOT DETECTED Final   Parainfluenza Virus 2 NOT DETECTED NOT DETECTED Final   Parainfluenza Virus 3 NOT DETECTED NOT DETECTED Final   Parainfluenza Virus 4 NOT DETECTED NOT DETECTED Final   Respiratory Syncytial Virus NOT DETECTED NOT DETECTED Final   Bordetella pertussis NOT DETECTED NOT DETECTED Final   Bordetella Parapertussis NOT DETECTED NOT DETECTED Final   Chlamydophila pneumoniae NOT DETECTED NOT DETECTED Final   Mycoplasma pneumoniae NOT DETECTED NOT DETECTED Final    Comment: Performed at [facility], [place] [address], [place], [place] [address]    Coagulation Studies: Recent Labs    10/03/21 1250  LABPROT 15.0  INR 1.2     Urinalysis: No results for input(s): "COLORURINE", "LABSPEC", "PHURINE", "GLUCOSEU", "HGBUR", "BILIRUBINUR", "KETONESUR", "PROTEINUR", "UROBILINOGEN", "NITRITE", "LEUKOCYTESUR" in the last 72 hours.  Invalid input(s): "APPERANCEUR"    Imaging: No results found.   Medications:    sodium chloride Stopped (10/04/21  0208)   feeding supplement (NEPRO CARB STEADY) Stopped (10/03/21 0800)   fentaNYL infusion INTRAVENOUS 125 mcg/hr (10/05/21 1013)   midazolam 2 mg/hr (10/05/21 1014)   norepinephrine (LEVOPHED) Adult infusion Stopped (10/04/21 1610)    sodium chloride   Intravenous Once   [START ON 10/06/2021] aspirin  81 mg Per Tube Daily   [START ON 10/06/2021] atorvastatin  10 mg Per Tube Daily   bisacodyl  10 mg Rectal Once   budesonide (PULMICORT) nebulizer solution  0.5 mg Nebulization BID   Chlorhexidine Gluconate Cloth  6 each Topical Daily   cholecalciferol  2,000 Units Per Tube Daily   clonazepam  0.5 mg Per Tube BID   feeding supplement (PROSource TF)  90 mL Per Tube TID   free water  30 mL Per Tube Q4H   heparin injection (subcutaneous)  5,000 Units Subcutaneous Q8H   insulin aspart  0-15 Units Subcutaneous Q4H   ipratropium-albuterol  3 mL Nebulization BID   multivitamin  1 tablet Per Tube QHS   nutrition supplement (JUVEN)  1 packet Per Tube BID BM   mouth rinse  15 mL Mouth Rinse Q2H   oxyCODONE  10 mg Per Tube Q8H   [START ON 10/07/2021] pantoprazole  40 mg Intravenous Q12H   polyethylene glycol  17 g Per Tube BID   traZODone  100 mg Per Tube QHS   acetaminophen, acetaminophen, albuterol, alteplase, bisacodyl, fentaNYL, guaiFENesin-dextromethorphan, heparin, lidocaine (PF), lidocaine-prilocaine, midazolam, mouth rinse, pentafluoroprop-tetrafluoroeth  Assessment/ Plan:  Mr. JORIAN WILLHOITE is a 66 y.o.  male with past medical conditions including COPD requiring 2 L  nasal cannula, and congestive heart failure.  Patient presents to the emergency department with shortness of breath.  Patient has been admitted for COPD exacerbation ([place]) [J44.1] Obesity hypoventilation syndrome ([place]) [E66.2] Acute on chronic diastolic congestive heart failure ([name]) [I50.33] Acute respiratory failure with hypoxia and hypercapnia ([name]) [J96.01, J96.02] Acute on chronic respiratory failure with hypoxia and  hypercapnia ([name]) [I09.73, J96.22]    Acute Kidney Injury with hyperkalemia, cause unidentified.  No IV contrast exposure and brief hypotensive event. Normal renal function 2 days prior.Renal Ultrasound shows mild right obstruction and simple cyst in  left kidney.   -Creatinine increased today with decrease in urine output despite IV furosemide 40 mg given yesterday -Will receive scheduled dialysis today, no UF.  We will continue to monitor daily need for dialysis. -Potassium corrected with dialysis  Lab Results  Component Value Date   CREATININE 3.86 (H) 10/05/2021   CREATININE 3.77 (H) 10/04/2021   CREATININE 2.44 (H) 10/03/2021    Intake/Output Summary (Last 24 hours) at 10/05/2021 1219 Last data filed at 10/05/2021 1014 Gross per 24 hour  Intake 412.23 ml  Output 448 ml  Net -35.77 ml    Acute respiratory failure Ventilator assisted, trach placed on 6/20. FiO2 50% Completed IV antibiotic therapy Pseudomonas noted in sputum on September 19, 2021   Chronic diastolic heart failure.  Echo from 08/07/2021 shows EF 60 to 65% with a mildly dilated left ventricular cavity and grade 1 diastolic dysfunction. Home regimen of aspirin, carvedilol, Farxiga, simvastatin, torsemide All on hold at present.  Given as needed doses of IV furosemide.    LOS: [place] 6/22/202312:19 PM

## 2021-10-05 NOTE — Progress Notes (Signed)
Nutrition Follow-up  DOCUMENTATION CODES:   Morbid obesity  INTERVENTION:   Once enteral access gained, recommend:   Continue Nepro '@70ml'$ /hr + ProSource TF 68m TID via tube    Free water flushes 371mq4 hours to maintain tube patency    Regimen provides 3264kcal/day, 203g/day protein and 140171may of free water.    Cholecalciferol 2000 units daily via tube    Juven Fruit Punch BID via tube, each serving provides 95kcal and 2.5g of protein (amino acids glutamine and arginine)   Rena-vit daily via tube   NUTRITION DIAGNOSIS:   Inadequate oral intake related to inability to eat (pt sedated and ventilated) as evidenced by NPO status.  Ongoing  GOAL:   Provide needs based on ASPEN/SCCM guidelines  Progressing   MONITOR:   Vent status, Labs, Weight trends, TF tolerance, Skin, I & O's  REASON FOR ASSESSMENT:   Ventilator    ASSESSMENT:   [age]o male with h/o PUD, OSA, HTN, CKD III, substance abuse, CHF, Hep C and COPD and COVID 19 (02/2021) who is admitted with COPD exacerbation, CHF and AKI.  6/20- s/p trach  Patient is on ventilator support via trach MV: 11.1 L/min Temp (24hrs), Avg:97.8 F (36.6 C), Min:97.4 F (36.3 C), Max:98.1 F (36.7 C)  Reviewed I/O's: -82 ml x 24 hours and +9.2 L since 09/21/21  UOP: 1.1 L x 24 hours   Case discussed with MD, RN, and during ICU rounds.   Pt with no enteral access currently; plan for IR NGT placement today per RN. Plan to resume feeds once NGT placement is verified.   Per RN, pt is following some commands, but is drowsy.   Medications reviewed and include versed, vitamin D3 and miralax.   Labs reviewed: K: 3.4, CBGS: 65-74 (inpatient orders for glycemic control are none).    Diet Order:   Diet Order             Diet NPO time specified  Diet effective midnight                   EDUCATION NEEDS:   No education needs have been identified at this time  Skin:  Skin Assessment: Skin Integrity  Issues: Skin Integrity Issues:: Incisions, Stage II, Other (Comment) Stage II: medial perineum Incisions: closed neck s/p trach Other: MASD to rt groin  Last BM:  10/04/21  Height:   Ht Readings from Last 1 Encounters:  09/28/21 '6\' 3"'$  (1.905 m)    Weight:   Wt Readings from Last 1 Encounters:  10/04/21 (!) 162 kg    Ideal Body Weight:  89 kg  BMI:  Body mass index is 44.64 kg/m.  Estimated Nutritional Needs:   Kcal:  3000-3300kcal/day  Protein:  178-225g/day  Fluid:  2.0L/day    JenLoistine ChanceD, LDN, CDCBay Parkgistered Dietitian II Certified Diabetes Care and Education Specialist Please refer to AMION for RD and/or RD on-call/weekend/after hours pager

## 2021-10-06 ENCOUNTER — Inpatient Hospital Stay: Payer: Medicaid Other

## 2021-10-06 DIAGNOSIS — Z515 Encounter for palliative care: Secondary | ICD-10-CM

## 2021-10-06 DIAGNOSIS — K921 Melena: Secondary | ICD-10-CM | POA: Diagnosis not present

## 2021-10-06 DIAGNOSIS — J9601 Acute respiratory failure with hypoxia: Secondary | ICD-10-CM | POA: Diagnosis not present

## 2021-10-06 DIAGNOSIS — Z789 Other specified health status: Secondary | ICD-10-CM

## 2021-10-06 DIAGNOSIS — G9341 Metabolic encephalopathy: Secondary | ICD-10-CM | POA: Diagnosis not present

## 2021-10-06 DIAGNOSIS — I5033 Acute on chronic diastolic (congestive) heart failure: Secondary | ICD-10-CM | POA: Diagnosis not present

## 2021-10-06 DIAGNOSIS — J9621 Acute and chronic respiratory failure with hypoxia: Secondary | ICD-10-CM | POA: Diagnosis not present

## 2021-10-06 DIAGNOSIS — G4733 Obstructive sleep apnea (adult) (pediatric): Secondary | ICD-10-CM

## 2021-10-06 DIAGNOSIS — E662 Morbid (severe) obesity with alveolar hypoventilation: Secondary | ICD-10-CM

## 2021-10-06 DIAGNOSIS — J9622 Acute and chronic respiratory failure with hypercapnia: Secondary | ICD-10-CM | POA: Diagnosis not present

## 2021-10-06 LAB — BLOOD GAS, ARTERIAL
Acid-Base Excess: 1.7 mmol/L (ref 0.0–2.0)
Bicarbonate: 28.2 mmol/L — ABNORMAL HIGH (ref 20.0–28.0)
FIO2: 50 %
MECHVT: 510 mL
Mechanical Rate: 20
O2 Saturation: 94.8 %
PEEP: 10 cmH2O
Patient temperature: 37
pCO2 arterial: 51 mmHg — ABNORMAL HIGH (ref 32–48)
pH, Arterial: 7.35 (ref 7.35–7.45)
pO2, Arterial: 67 mmHg — ABNORMAL LOW (ref 83–108)

## 2021-10-06 LAB — CBC
HCT: 28.3 % — ABNORMAL LOW (ref 39.0–52.0)
Hemoglobin: 8.1 g/dL — ABNORMAL LOW (ref 13.0–17.0)
MCH: 22.3 pg — ABNORMAL LOW (ref 26.0–34.0)
MCHC: 28.6 g/dL — ABNORMAL LOW (ref 30.0–36.0)
MCV: 78 fL — ABNORMAL LOW (ref 80.0–100.0)
Platelets: 186 10*3/uL (ref 150–400)
RBC: 3.63 MIL/uL — ABNORMAL LOW (ref 4.22–5.81)
RDW: 16.3 % — ABNORMAL HIGH (ref 11.5–15.5)
WBC: 7.2 10*3/uL (ref 4.0–10.5)
nRBC: 0 % (ref 0.0–0.2)

## 2021-10-06 LAB — BASIC METABOLIC PANEL
Anion gap: 11 (ref 5–15)
BUN: 98 mg/dL — ABNORMAL HIGH (ref 8–23)
CO2: 26 mmol/L (ref 22–32)
Calcium: 8.1 mg/dL — ABNORMAL LOW (ref 8.9–10.3)
Chloride: 103 mmol/L (ref 98–111)
Creatinine, Ser: 4.67 mg/dL — ABNORMAL HIGH (ref 0.61–1.24)
GFR, Estimated: 13 mL/min — ABNORMAL LOW (ref >60)
Glucose, Bld: 98 mg/dL (ref 70–99)
Potassium: 3.4 mmol/L — ABNORMAL LOW (ref 3.5–5.1)
Sodium: 140 mmol/L (ref 135–145)

## 2021-10-06 LAB — GLUCOSE, CAPILLARY
Glucose-Capillary: 100 mg/dL — ABNORMAL HIGH (ref 70–99)
Glucose-Capillary: 117 mg/dL — ABNORMAL HIGH (ref 70–99)

## 2021-10-06 LAB — PHOSPHORUS: Phosphorus: 6.5 mg/dL — ABNORMAL HIGH (ref 2.5–4.6)

## 2021-10-06 LAB — MAGNESIUM: Magnesium: 1.9 mg/dL (ref 1.7–2.4)

## 2021-10-06 LAB — ALBUMIN: Albumin: 2 g/dL — ABNORMAL LOW (ref 3.5–5.0)

## 2021-10-06 LAB — HEMOGLOBIN AND HEMATOCRIT, BLOOD
HCT: 26.7 % — ABNORMAL LOW (ref 39.0–52.0)
HCT: 28.1 % — ABNORMAL LOW (ref 39.0–52.0)
HCT: 27.7 % — ABNORMAL LOW (ref 39.0–52.0)
Hemoglobin: 8 g/dL — ABNORMAL LOW (ref 13.0–17.0)
Hemoglobin: 8.1 g/dL — ABNORMAL LOW (ref 13.0–17.0)
Hemoglobin: 8 g/dL — ABNORMAL LOW (ref 13.0–17.0)

## 2021-10-06 LAB — HCV RNA QUANT: HCV Quantitative: NOT DETECTED IU/mL (ref >50)

## 2021-10-06 MED ORDER — SODIUM CHLORIDE 0.9 % IV SOLN
100.0000 mg | Freq: Two times a day (BID) | INTRAVENOUS | Status: DC
  Administered 2021-10-06 – 2021-10-08 (×4): 100 mg via INTRAVENOUS
  Filled 2021-10-06 (×5): qty 100

## 2021-10-06 MED ORDER — ALBUTEROL SULFATE (2.5 MG/3ML) 0.083% IN NEBU
2.5000 mg | INHALATION_SOLUTION | RESPIRATORY_TRACT | Status: DC
  Administered 2021-10-06 – 2021-10-10 (×22): 2.5 mg via RESPIRATORY_TRACT
  Filled 2021-10-06 (×22): qty 3

## 2021-10-06 MED ORDER — HEPARIN SODIUM (PORCINE) 1000 UNIT/ML IJ SOLN
INTRAMUSCULAR | Status: AC
  Filled 2021-10-06: qty 10

## 2021-10-06 MED ORDER — ALBUTEROL SULFATE (2.5 MG/3ML) 0.083% IN NEBU
INHALATION_SOLUTION | RESPIRATORY_TRACT | Status: AC
  Administered 2021-10-06: 2.5 mg via RESPIRATORY_TRACT
  Filled 2021-10-06: qty 3

## 2021-10-06 MED ORDER — SODIUM CHLORIDE 0.9 % IV SOLN
500.0000 mg | INTRAVENOUS | Status: DC
  Administered 2021-10-06 – 2021-10-08 (×3): 500 mg via INTRAVENOUS
  Filled 2021-10-06: qty 500
  Filled 2021-10-06: qty 10
  Filled 2021-10-06 (×2): qty 500

## 2021-10-06 NOTE — Consult Note (Signed)
Pharmacy Antibiotic Note  Gary [name] is a 66 y.o. male admitted on 09/19/2021 with pneumonia.  Pharmacy has been consulted for meropenem dosing.  Plan: Pt to receive meropenem 500 mg q24H. Plan for HD 6/23.   Height: 6\' 3"  (190.5 cm) Weight:  (no bedscale, unable to weigh patient.) IBW/kg (Calculated) : 84.5  Temp (24hrs), Avg:98.8 F (37.1 C), Min:97.7 F (36.5 C), Max:99.3 F (37.4 C)  Recent Labs  Lab 10/02/21 2152 10/03/21 0505 10/03/21 1251 10/04/21 0447 10/05/21 0143 10/06/21 0343  WBC  --  14.4* 12.8* 11.4* 7.1 7.2  CREATININE 2.40* 2.44*  --  3.77* 3.86* 4.67*    Estimated Creatinine Clearance: 27.4 mL/min (A) (by C-G formula based on SCr of 4.67 mg/dL (H)).    No Known Allergies    Microbiology results: 6/23  Tracheal Aspirate; Respiratory: ABUNDANT GRAM NEGATIVE RODS    Thank you for allowing pharmacy to be a part of this patient's care.  Ronnald Ramp, PharmD, BCPS 10/06/2021 5:47 PM

## 2021-10-06 NOTE — Plan of Care (Signed)
Continuing with plan of care. 

## 2021-10-06 NOTE — Therapy (Signed)
Pt transported to CT on the trilogy.  Settings 510/20/10/40%. No adverse reactions.  Pt suctioned upon return for copious amt of thick tan secretions. Pt placed back on servo-I at ordered settings.

## 2021-10-06 NOTE — Consult Note (Signed)
Consultation Note Date: 10/06/2021   Patient Name: Gary [name]  DOB: Nov 15, 1955  MRN: [id number]  Age / Sex: 66 y.o., male  [facility]P: [facility] Referring Physician: Tyler Pita, MD  Reason for Consultation: Establishing goals of care  HPI/Patient Profile: 66 y.o. male  with past medical history of HFpEF, COPD (2 L nasal cannula), OSA, HTN, morbid obesity, multiple gastric ulcers, and tobacco abuse admitted on 09/19/2021 with shortness of breath.  Patient is being treated for acute metabolic encephalopathy, acute on chronic hypoxemia and hyper Respiratory failure, acute decompensated HF, and AECOPD.  6/20 tracheostomy was placed.  6/21 HD was performed but clotted off and was not able to be rinsed back.  6/23 patient is tolerating HD.  CT pending to determine if right-sided thoracentesis is needed.  By the medicine team was consulted to discuss goals of care.    Clinical Assessment and Goals of Care: I have reviewed medical records including EPIC notes, labs and imaging, assessed the patient and then spoke with patient's sister Gary [name] over the phone to discuss diagnosis prognosis, [place], EOL wishes, disposition and options.  Patient has met with my PMT colleague NP Laurann Montana on 08/14/2021.  At that time, patient was alert and oriented and had capacity to make complex medical decisions for himself.  He stated then he would like full code and full scope.  He was in agreement with tracheostomy.  When speaking with sister Gary [name], I introduced Palliative Medicine as specialized medical care for people living with serious illness. It focuses on providing relief from the symptoms and stress of a serious illness. The goal is to improve quality of life for both the patient and the family.  We discussed a brief life review of the patient.  Gary [name] shares patient has been with Gary [name], his girlfriend, for  several years.  Patient has no children.  Patient has 5 sisters, 3 of whom are local and heavily involved in his care and decision making.  The sisters are Gary [name], and Gary [name].  We discussed patient's current illness and what it means in the larger context of patient's on-going co-morbidities.  Reviewed acute kidney injury and need for HD.  Discussed heart failure and COPD as chronic, progressive, and irreversible diseases.   I attempted to elicit values and goals of care important to the patient. Gary [name] says the patient is hardheaded, will not eat or do right, but also did not want to die. Advance directives and concepts specific to code status were considered and discussed.  Gary [name] shares she knows the patient is not ready to give up and still wants to fight.  However, Gary [name] also states that she would not want to cause more trauma than harm.  She shares that she and her sisters want to focus on quality of life.  However, they know that the patient would want anything and everything done to keep him alive.  Full code and full scope remains.  Family is facing treatment option decisions, advanced directive, and anticipatory care needs.  Questions and concerns were addressed. The family was encouraged to call with questions or concerns.  I shared with family that I am off service after today and plan to return on Tuesday.Discussed with Gary [name] the importance of continued conversation with family and the medical providers regarding overall plan of care and treatment options, ensuring decisions are within the context of the patient's values and GOCs.    Primary Decision Maker NEXT OF KIN  Code Status/Advance Care Planning: Full code  Prognosis:   Unable to determine  Discharge Planning: To Be Determined  Primary Diagnoses: Present on Admission:  Acute on chronic respiratory failure with hypoxia and hypercapnia ([name])  Obesity, Class III, BMI 40-49.9 (morbid obesity) ([name])  HLD  (hyperlipidemia)  Chronic kidney disease, stage 3a ([name])  Acute on chronic diastolic congestive heart failure ([name])  Obstructive sleep apnea  Acute metabolic encephalopathy  COPD with acute exacerbation ([name])  HTN (hypertension)   Physical Exam Vitals reviewed.  Constitutional:      General: He is not in acute distress.    Appearance: He is obese. He is ill-appearing and toxic-appearing.  HENT:     Head: Normocephalic and atraumatic.  Cardiovascular:     Rate and Rhythm: Normal rate.     Pulses: Normal pulses.  Pulmonary:     Comments: MV Musculoskeletal:     Comments: Sedated     Palliative Assessment/Data: 30%     I discussed this patient's plan of care with RN, patient's siter Gary [name].  Thank you for this consult. Palliative medicine will continue to follow and assist holistically.   Time Total: 75 minutes Greater than 50%  of this time was spent counseling and coordinating care related to the above assessment and plan.  Signed by: Jordan Hawks, DNP, FNP-BC Palliative Medicine    Please contact Palliative Medicine Team phone at [phone] for questions and concerns.  For individual provider: See Shea Evans

## 2021-10-07 ENCOUNTER — Inpatient Hospital Stay: Payer: Medicaid Other

## 2021-10-07 DIAGNOSIS — J9621 Acute and chronic respiratory failure with hypoxia: Secondary | ICD-10-CM | POA: Diagnosis not present

## 2021-10-07 DIAGNOSIS — J9622 Acute and chronic respiratory failure with hypercapnia: Secondary | ICD-10-CM | POA: Diagnosis not present

## 2021-10-07 LAB — HEMOGLOBIN AND HEMATOCRIT, BLOOD
HCT: 27.8 % — ABNORMAL LOW (ref 39.0–52.0)
Hemoglobin: 8.1 g/dL — ABNORMAL LOW (ref 13.0–17.0)

## 2021-10-07 LAB — CBC WITH DIFFERENTIAL/PLATELET
Abs Immature Granulocytes: 0.04 10*3/uL (ref 0.00–0.07)
Basophils Absolute: 0 10*3/uL (ref 0.0–0.1)
Basophils Relative: 1 %
Eosinophils Absolute: 0.2 10*3/uL (ref 0.0–0.5)
Eosinophils Relative: 2 %
HCT: 28.3 % — ABNORMAL LOW (ref 39.0–52.0)
Hemoglobin: 8.2 g/dL — ABNORMAL LOW (ref 13.0–17.0)
Immature Granulocytes: 1 %
Lymphocytes Relative: 12 %
Lymphs Abs: 1 10*3/uL (ref 0.7–4.0)
MCH: 22.3 pg — ABNORMAL LOW (ref 26.0–34.0)
MCHC: 29 g/dL — ABNORMAL LOW (ref 30.0–36.0)
MCV: 77.1 fL — ABNORMAL LOW (ref 80.0–100.0)
Monocytes Absolute: 0.9 10*3/uL (ref 0.1–1.0)
Monocytes Relative: 11 %
Neutro Abs: 6.4 10*3/uL (ref 1.7–7.7)
Neutrophils Relative %: 73 %
Platelets: 187 10*3/uL (ref 150–400)
RBC: 3.67 MIL/uL — ABNORMAL LOW (ref 4.22–5.81)
RDW: 16.6 % — ABNORMAL HIGH (ref 11.5–15.5)
WBC: 8.6 10*3/uL (ref 4.0–10.5)
nRBC: 0 % (ref 0.0–0.2)

## 2021-10-07 LAB — RENAL FUNCTION PANEL
Albumin: 2.2 g/dL — ABNORMAL LOW (ref 3.5–5.0)
Anion gap: 9 (ref 5–15)
BUN: 110 mg/dL — ABNORMAL HIGH (ref 8–23)
CO2: 27 mmol/L (ref 22–32)
Calcium: 8.2 mg/dL — ABNORMAL LOW (ref 8.9–10.3)
Chloride: 101 mmol/L (ref 98–111)
Creatinine, Ser: 4.62 mg/dL — ABNORMAL HIGH (ref 0.61–1.24)
GFR, Estimated: 13 mL/min — ABNORMAL LOW (ref >60)
Glucose, Bld: 113 mg/dL — ABNORMAL HIGH (ref 70–99)
Phosphorus: 5.7 mg/dL — ABNORMAL HIGH (ref 2.5–4.6)
Potassium: 3.5 mmol/L (ref 3.5–5.1)
Sodium: 137 mmol/L (ref 135–145)

## 2021-10-07 LAB — GLUCOSE, CAPILLARY: Glucose-Capillary: 104 mg/dL — ABNORMAL HIGH (ref 70–99)

## 2021-10-07 LAB — MAGNESIUM: Magnesium: 1.8 mg/dL (ref 1.7–2.4)

## 2021-10-07 NOTE — Progress Notes (Signed)
[facility]  ROUNDING NOTE   Subjective:   Gary [name] is a 66 year old male with past medical conditions including COPD requiring 2 L nasal cannula, and congestive heart failure.  Patient presents to the emergency department with shortness of breath.  Patient has been admitted for COPD exacerbation ([name]) [J44.1] Obesity hypoventilation syndrome ([name]) [E66.2] Acute on chronic diastolic congestive heart failure ([name]) [I50.33] Acute respiratory failure with hypoxia and hypercapnia ([name]) [J96.01, J96.02] Acute on chronic respiratory failure with hypoxia and hypercapnia ([name]) [R60.45, J96.22]  Patient is known and has recently been followed by Dr. Cherylann Ratel for acute kidney injury, resolved at his May appointment.    Patient was seen today in ICU, patient remains sedated, unable to offer any complaints   Objective:  Vital signs in last 24 hours:  Temp:  [98 F (36.7 C)-99.4 F (37.4 C)] 98.5 F (36.9 C) (06/24 0400) Pulse Rate:  [79-93] 89 (06/24 0900) Resp:  [11-29] 21 (06/24 0900) BP: (82-124)/(49-77) 118/69 (06/24 0900) SpO2:  [86 %-100 %] 94 % (06/24 0900) FiO2 (%):  [50 %-100 %] 60 % (06/24 0900) Weight:  [186 kg] 186 kg (06/24 0500)  Weight change: 10 kg Filed Weights   10/06/21 0346 10/07/21 0500  Weight: (!) 176 kg (!) 186 kg    Intake/Output: I/O last 3 completed shifts: In: 1851.9 [I.V.:317.7; NG/GT:1534.2] Out: 2266 [Urine:2700]   Intake/Output this shift:  Total I/O In: 275.5 [I.V.:275.1; IV Piggyback:0.4] Out: -   Physical Exam: General: Critically ill-appearing, morbidly obese gentleman, BMI of 42  Head: Normocephalic, atraumatic.   Eyes: Anicteric  Lungs:  Rhonchi throughout, vent and trach placed 10/03/21  Heart: Regular rhythm, tachycardic  Abdomen:  Soft, nontender, obese  Extremities: 2+ peripheral edema.  Neurologic: Sedated  Skin: No lesions  Access: Right IJ temp cath    Basic Metabolic Panel: Recent Labs  Lab 10/03/21 0505  10/04/21 0447 10/05/21 0143 10/06/21 0343 10/07/21 0548  NA 138 138 136 140 137  K 4.1 4.2 3.4* 3.4* 3.5  CL 101 103 102 103 101  CO2 28 26 27 26 27   GLUCOSE 106* 84 94 98 113*  BUN 84* 98* 100* 98* 110*  CREATININE 2.44* 3.77* 3.86* 4.67* 4.62*  CALCIUM 8.6* 8.4* 8.1* 8.1* 8.2*  MG 2.0 2.0 1.9 1.9 1.8  PHOS 5.9* 5.8* 5.0* 6.5* 5.7*    Liver Function Tests: Recent Labs  Lab 10/02/21 0349 10/06/21 0343 10/07/21 0548  AST 13*  --   --   ALT 18  --   --   ALKPHOS 64  --   --   BILITOT 0.5  --   --   PROT 7.1  --   --   ALBUMIN 2.3* 2.0* 2.2*   No results for input(s): "LIPASE", "AMYLASE" in the last 168 hours. No results for input(s): "AMMONIA" in the last 168 hours.  CBC: Recent Labs  Lab 10/02/21 0349 10/03/21 0505 10/03/21 1251 10/03/21 1814 10/04/21 0447 10/04/21 1203 10/05/21 0143 10/05/21 0959 10/06/21 0135 10/06/21 0343 10/06/21 1309 10/06/21 2325 10/07/21 0548  WBC 11.9* 14.4* 12.8*  --  11.4*  --  7.1  --   --  7.2  --   --  8.6  NEUTROABS 9.9* 12.1*  --   --   --   --   --   --   --   --   --   --  6.4  HGB 10.4* 10.4* 9.4*   < > 9.1*   < > 8.2*   < >  8.0* 8.1* 8.1* 8.0* 8.2*  HCT 35.9* 36.6* 33.4*   < > 31.3*   < > 28.0*   < > 26.7* 28.3* 28.1* 27.7* 28.3*  MCV 78.6* 79.9* 80.7  --  78.4*  --  76.9*  --   --  78.0*  --   --  77.1*  PLT 179 155 146*  --  143*  --  144*  --   --  186  --   --  187   < > = values in this interval not displayed.    Cardiac Enzymes: No results for input(s): "CKTOTAL", "CKMB", "CKMBINDEX", "TROPONINI" in the last 168 hours.  BNP: Invalid input(s): "POCBNP"  CBG: Recent Labs  Lab 10/05/21 1947 10/05/21 2336 10/06/21 0335 10/06/21 0746 10/07/21 0802  GLUCAP 78 97 100* 117* 104*    Microbiology: Results for orders placed or performed during the hospital encounter of 09/19/21  SARS Coronavirus 2 by RT PCR (hospital order, performed in [facility] hospital lab) *cepheid single result test* Anterior Nasal Swab      Status: None   Collection Time: 09/19/21  9:57 AM   Specimen: Anterior Nasal Swab  Result Value Ref Range Status   SARS Coronavirus 2 by RT PCR NEGATIVE NEGATIVE Final    Comment: (NOTE) SARS-CoV-2 target nucleic acids are NOT DETECTED.  The SARS-CoV-2 RNA is generally detectable in upper and lower respiratory specimens during the acute phase of infection. The lowest concentration of SARS-CoV-2 viral copies this assay can detect is 250 copies / mL. A negative result does not preclude SARS-CoV-2 infection and should not be used as the sole basis for treatment or other patient management decisions.  A negative result may occur with improper specimen collection / handling, submission of specimen other than nasopharyngeal swab, presence of viral mutation(s) within the areas targeted by this assay, and inadequate number of viral copies (<250 copies / mL). A negative result must be combined with clinical observations, patient history, and epidemiological information.  Fact Sheet for Patients:   RoadLapTop.co.za  Fact Sheet for Healthcare Providers: http://kim-miller.com/  This test is not yet approved or  cleared by the [place] FDA and has been authorized for detection and/or diagnosis of SARS-CoV-2 by FDA under an Emergency Use Authorization (EUA).  This EUA will remain in effect (meaning this test can be used) for the duration of the COVID-19 declaration under Section 564(b)(1) of the Act, 21 U.S.C. section 360bbb-3(b)(1), unless the authorization is terminated or revoked sooner.  Performed at [facility], [address] [place], [place] [address]   Culture, Respiratory w Gram Stain     Status: None   Collection Time: 09/19/21 11:55 AM   Specimen: SPU  Result Value Ref Range Status   Specimen Description   Final    SPUTUM Performed at [facility], [address], [place], [place] [address]    Special  Requests   Final    NONE Performed at [facility], [address] [place], [place] [address]    Gram Stain   Final    FEW WBC PRESENT, PREDOMINANTLY PMN FEW GRAM POSITIVE COCCI IN PAIRS Performed at [facility], [address], [place], [place] [address]    Culture RARE [name]  Final   Report Status 09/24/2021 FINAL  Final   Organism ID, Bacteria [name]  Final      Susceptibility   [name] - MIC*    CEFTAZIDIME 4 SENSITIVE Sensitive  CIPROFLOXACIN <=0.25 SENSITIVE Sensitive     GENTAMICIN 2 SENSITIVE Sensitive     IMIPENEM 1 SENSITIVE Sensitive     PIP/TAZO 8 SENSITIVE Sensitive     CEFEPIME 2 SENSITIVE Sensitive     * RARE [name]  MRSA Next Gen by PCR, Nasal     Status: None   Collection Time: 09/19/21  6:10 PM   Specimen: Nasal Mucosa; Nasal Swab  Result Value Ref Range Status   MRSA by PCR Next Gen NOT DETECTED NOT DETECTED Final    Comment: (NOTE) The GeneXpert MRSA Assay (FDA approved for NASAL specimens only), is one component of a comprehensive MRSA colonization surveillance program. It is not intended to diagnose MRSA infection nor to guide or monitor treatment for MRSA infections. Test performance is not FDA approved in patients less than 44 years old. Performed at [facility], [address] [place], [place] [address]   Gastrointestinal Panel by PCR , Stool     Status: Abnormal   Collection Time: 09/21/21 10:53 AM   Specimen: Stool  Result Value Ref Range Status   Campylobacter species NOT DETECTED NOT DETECTED Final   Plesimonas shigelloides NOT DETECTED NOT DETECTED Final   Salmonella species NOT DETECTED NOT DETECTED Final   Yersinia enterocolitica DETECTED (A) NOT DETECTED Final    Comment: RESULT CALLED TO, READ BACK BY AND VERIFIED WITH: [facility] JACKSON AT 1126 09/22/21.PMF    Vibrio species NOT DETECTED NOT DETECTED Final   Vibrio cholerae NOT DETECTED NOT  DETECTED Final   Enteroaggregative E coli (EAEC) NOT DETECTED NOT DETECTED Final   Enteropathogenic E coli (EPEC) NOT DETECTED NOT DETECTED Final   Enterotoxigenic E coli (ETEC) NOT DETECTED NOT DETECTED Final   Shiga like toxin producing E coli (STEC) NOT DETECTED NOT DETECTED Final   Shigella/Enteroinvasive E coli (EIEC) NOT DETECTED NOT DETECTED Final   Cryptosporidium NOT DETECTED NOT DETECTED Final   Cyclospora cayetanensis NOT DETECTED NOT DETECTED Final   Entamoeba histolytica NOT DETECTED NOT DETECTED Final   Giardia lamblia NOT DETECTED NOT DETECTED Final   Adenovirus F40/41 NOT DETECTED NOT DETECTED Final   Astrovirus NOT DETECTED NOT DETECTED Final   Norovirus GI/GII NOT DETECTED NOT DETECTED Final   Rotavirus A NOT DETECTED NOT DETECTED Final   Sapovirus (I, II, IV, and V) NOT DETECTED NOT DETECTED Final    Comment: Performed at [facility], [address] [place], [place] [address]  C Difficile Quick Screen w PCR reflex     Status: None   Collection Time: 09/21/21 11:30 AM   Specimen: STOOL  Result Value Ref Range Status   C Diff antigen NEGATIVE NEGATIVE Final   C Diff toxin NEGATIVE NEGATIVE Final   C Diff interpretation No C. difficile detected.  Final    Comment: Performed at [facility], [address] [place] [address] [place], [place] [address]  Culture, blood (Routine X 2) w Reflex to ID Panel     Status: None   Collection Time: 09/21/21 12:28 PM   Specimen: BLOOD  Result Value Ref Range Status   Specimen Description BLOOD BLOOD RIGHT HAND  Final   Special Requests   Final    BOTTLES DRAWN AEROBIC AND ANAEROBIC Blood Culture adequate volume   Culture   Final    NO GROWTH 5 DAYS Performed at [facility], [address], [place], [place] [address]    Report Status 09/26/2021 FINAL  Final  Culture, blood (Routine X 2) w Reflex to ID  Panel     Status: None   Collection Time: 09/21/21  2:08 PM   Specimen: BLOOD  Result Value Ref Range Status    Specimen Description BLOOD BLOOD LEFT HAND  Final   Special Requests   Final    BOTTLES DRAWN AEROBIC AND ANAEROBIC Blood Culture adequate volume   Culture   Final    NO GROWTH 5 DAYS Performed at [facility], [address] [place], [place] [address]    Report Status 09/26/2021 FINAL  Final  Respiratory (~20 pathogens) panel by PCR     Status: None   Collection Time: 09/21/21  6:14 PM   Specimen: Nasopharyngeal Swab; Respiratory  Result Value Ref Range Status   Adenovirus NOT DETECTED NOT DETECTED Final   Coronavirus 229E NOT DETECTED NOT DETECTED Final    Comment: (NOTE) The Coronavirus on the Respiratory Panel, DOES NOT test for the novel  Coronavirus (2019 nCoV)    Coronavirus HKU1 NOT DETECTED NOT DETECTED Final   Coronavirus NL63 NOT DETECTED NOT DETECTED Final   Coronavirus OC43 NOT DETECTED NOT DETECTED Final   Metapneumovirus NOT DETECTED NOT DETECTED Final   Rhinovirus / Enterovirus NOT DETECTED NOT DETECTED Final   Influenza A NOT DETECTED NOT DETECTED Final   Influenza B NOT DETECTED NOT DETECTED Final   Parainfluenza Virus 1 NOT DETECTED NOT DETECTED Final   Parainfluenza Virus 2 NOT DETECTED NOT DETECTED Final   Parainfluenza Virus 3 NOT DETECTED NOT DETECTED Final   Parainfluenza Virus 4 NOT DETECTED NOT DETECTED Final   Respiratory Syncytial Virus NOT DETECTED NOT DETECTED Final   Bordetella pertussis NOT DETECTED NOT DETECTED Final   Bordetella Parapertussis NOT DETECTED NOT DETECTED Final   Chlamydophila pneumoniae NOT DETECTED NOT DETECTED Final   Mycoplasma pneumoniae NOT DETECTED NOT DETECTED Final    Comment: Performed at [facility], [address], [place], [place] [address]  Culture, Respiratory w Gram Stain     Status: None (Preliminary result)   Collection Time: 10/06/21 10:59 AM   Specimen: Tracheal Aspirate; Respiratory  Result Value Ref Range Status   Specimen Description   Final    TRACHEAL ASPIRATE Performed at [facility], [address] [place], [place] [address]    Special Requests   Final    NONE Performed at [facility], [address] [place], [place] [address]    Gram Stain   Final    ABUNDANT WBC PRESENT, PREDOMINANTLY PMN ABUNDANT GRAM NEGATIVE RODS Performed at [facility], [address], [place], [place] [address]    Culture ABUNDANT GRAM NEGATIVE RODS  Final   Report Status PENDING  Incomplete    Coagulation Studies: No results for input(s): "LABPROT", "INR" in the last 72 hours.  Urinalysis: No results for input(s): "COLORURINE", "LABSPEC", "PHURINE", "GLUCOSEU", "HGBUR", "BILIRUBINUR", "KETONESUR", "PROTEINUR", "UROBILINOGEN", "NITRITE", "LEUKOCYTESUR" in the last 72 hours.  Invalid input(s): "APPERANCEUR"    Imaging: DG Abd 1 View  Result Date: 10/07/2021 CLINICAL DATA:  Encounter for orogastric tube placement EXAM: ABDOMEN - 1 VIEW COMPARISON:  Earlier the same day FINDINGS: Due to body habitus the enteric tube tip is difficult to visualize. Suspect that it at least reaches the proximal stomach and may be seen over the lateral left abdomen, at the gastric body level. Low volume chest with hazy density at the bases from pulmonary opacity and right pleural effusion. IMPRESSION: Very limited study due to body habitus. An enteric tube tip likely reaches the stomach.  Electronically Signed   By: Tiburcio Pea M.D.   On: 10/07/2021 07:20   DG Abd 1 View  Result Date: 10/07/2021 CLINICAL DATA:  OG tube placement. EXAM: ABDOMEN - 1 VIEW COMPARISON:  10/05/2021 FINDINGS: OG tube tip is in the proximal stomach with side port of the OG tube in the distal esophagus. Tube could be advanced 7-8 cm for placement of the side port in the stomach. Stable nonspecific bowel gas pattern. IMPRESSION: OG tube tip is in the proximal stomach with side port in the distal esophagus. Tube could be advanced 7-8 cm for placement of the side port in the stomach. Electronically Signed    By: Kennith Center M.D.   On: 10/07/2021 05:46   CT CHEST WO CONTRAST  Result Date: 10/06/2021 CLINICAL DATA:  Difficulty breathing EXAM: CT CHEST WITHOUT CONTRAST TECHNIQUE: Multidetector CT imaging of the chest was performed following the standard protocol without IV contrast. RADIATION DOSE REDUCTION: This exam was performed according to the departmental dose-optimization program which includes automated exposure control, adjustment of the mA and/or kV according to patient size and/or use of iterative reconstruction technique. COMPARISON:  CT done on 07/20/2021 and chest radiograph done earlier today FINDINGS: Cardiovascular: Coronary artery calcifications are seen. Heart is enlarged in size. Mediastinum/Nodes: There is shift of mediastinum to the right possibly due to decreased volume in right lung. No significant lymphadenopathy is seen in mediastinum. Enteric tube is noted traversing the esophagus. Tracheostomy tube is noted with its tip at the level of upper margin of aortic arch. Right IJ central venous catheter is seen with its tip in the superior vena cava. Lungs/Pleura: Large infiltrate with air bronchogram is seen in the posterior right lung. There is decreased right lung volume. There is small to moderate infiltrate in the medial left lower lung fields. There is no significant pleural effusion. There is no pneumothorax. Upper Abdomen: Tip of enteric tube is in the medial aspect of the fundus of the stomach close to the gastroesophageal junction. Musculoskeletal: No acute findings are seen. IMPRESSION: There is large infiltrate in the posterior right lung with air bronchogram. There is decreased volume in right lung. There is small to moderate infiltrate in the medial left lower lobe with air bronchogram. Findings suggest atelectasis in both lungs, more so on the right side. Follow-up studies should be considered to assess resolution and to rule out any central obstructing neoplastic process.  Coronary artery calcifications are seen.  Heart is enlarged in size. Tip of enteric tube is in the medial aspect of the fundus close to the gastroesophageal junction. Enteric tube could be advanced 5-10 cm to place the tip and side port within the stomach. Electronically Signed   By: Ernie Avena M.D.   On: 10/06/2021 16:26   DG Chest Port 1 View  Result Date: 10/06/2021 CLINICAL DATA:  Respiratory distress. EXAM: PORTABLE CHEST 1 VIEW COMPARISON:  October 03, 2021. FINDINGS: Stable cardiomegaly. Moderate right pleural effusion is noted with associated right basilar atelectasis or infiltrate. Tracheostomy tube is in good position. Nasogastric tube is noted. Right internal jugular catheter is again noted. Minimal left basilar atelectasis or edema is noted. Bony thorax is unremarkable. IMPRESSION: Moderate right pleural effusion is noted with associated right basilar atelectasis or infiltrate. Minimal left basilar atelectasis or edema is noted. Electronically Signed   By: Lupita Raider M.D.   On: 10/06/2021 08:49   DG Abd Portable 1V  Result Date: 10/05/2021 CLINICAL DATA:  Status post NG tube placement  EXAM: PORTABLE ABDOMEN - 1 VIEW COMPARISON:  09/28/2021 FINDINGS: NG tube tip and side port are below the level of the GE junction. The tip is approximately 6.5 cm below the level of the hemidiaphragms within the proximal stomach. Decreased aeration to the right lower lobe is noted concerning for a small effusion and or atelectasis. IMPRESSION: NG tube tip and side port are below the level of the hemidiaphragms within the proximal stomach. Electronically Signed   By: Signa Kell M.D.   On: 10/05/2021 15:41     Medications:    sodium chloride Stopped (10/07/21 0542)   doxycycline (VIBRAMYCIN) IV 100 mg (10/07/21 0542)   feeding supplement (NEPRO CARB STEADY) 70 mL/hr at 10/06/21 1800   fentaNYL infusion INTRAVENOUS 50 mcg/hr (10/07/21 0721)   meropenem (MERREM) IV 500 mg (10/06/21 2010)    norepinephrine (LEVOPHED) Adult infusion Stopped (10/06/21 0053)    sodium chloride   Intravenous Once   albuterol  2.5 mg Nebulization Q4H   aspirin  81 mg Per Tube Daily   atorvastatin  10 mg Per Tube Daily   budesonide (PULMICORT) nebulizer solution  0.5 mg Nebulization BID   Chlorhexidine Gluconate Cloth  6 each Topical Daily   cholecalciferol  2,000 Units Per Tube Daily   clonazepam  0.5 mg Per Tube BID   feeding supplement (PROSource TF)  90 mL Per Tube TID   free water  30 mL Per Tube Q4H   heparin injection (subcutaneous)  5,000 Units Subcutaneous Q8H   multivitamin  1 tablet Per Tube QHS   nutrition supplement (JUVEN)  1 packet Per Tube BID BM   mouth rinse  15 mL Mouth Rinse Q2H   oxyCODONE  10 mg Per Tube Q8H   pantoprazole  40 mg Intravenous Q12H   polyethylene glycol  17 g Per Tube BID   acetaminophen, acetaminophen, alteplase, bisacodyl, fentaNYL, heparin, lidocaine (PF), lidocaine-prilocaine, midazolam, mouth rinse, pentafluoroprop-tetrafluoroeth  Assessment/ Plan:  [name] is a 66 y.o.  male with past medical conditions including COPD requiring 2 L nasal cannula, and congestive heart failure.  Patient presents to the emergency department with shortness of breath.  Patient has been admitted for COPD exacerbation ([name]) [J44.1] Obesity hypoventilation syndrome ([name]) [E66.2] Acute on chronic diastolic congestive heart failure ([name]) [I50.33] Acute respiratory failure with hypoxia and hypercapnia ([name]) [J96.01, J96.02] Acute on chronic respiratory failure with hypoxia and hypercapnia ([name]) [Z61.09, J96.22]    Acute Kidney Injury with hyperkalemia, cause unidentified.  No IV contrast exposure and brief hypotensive event. Normal renal function 2 days prior.Renal Ultrasound shows mild right obstruction and simple cyst in  left kidney.   -Though patient is making adequate urine, BUN remains elevated. Patient was last dialyzed yesterday -We will allow patient to rest  over weekend, next treatment scheduled for Monday. -We will plan for PermCath early next week. -Primary team consulting palliative care for goals of care discussion.  Lab Results  Component Value Date   CREATININE 4.62 (H) 10/07/2021   CREATININE 4.67 (H) 10/06/2021   CREATININE 3.86 (H) 10/05/2021    Intake/Output Summary (Last 24 hours) at 10/07/2021 0920 Last data filed at 10/07/2021 0721 Gross per 24 hour  Intake 977.7 ml  Output 316 ml  Net 661.7 ml   Acute respiratory failure Ventilator assisted, trach placed on 6/20. FiO2 50% Completed IV antibiotic therapy Pseudomonas noted in sputum on September 19, 2021   Chronic diastolic heart failure.  Echo from 08/07/2021 shows EF 60 to 65% with  a mildly dilated left ventricular cavity and grade 1 diastolic dysfunction. Home regimen of aspirin, carvedilol, Farxiga, simvastatin, torsemide All on hold at present.  Given IV furosemide and metolazone yesterday.  Anemia of critical illness, Hemoglobin is stable no need for PRBC    LOS: 18 Kourtney Montesinos s Lamyah Creed 6/24/20239:20 AM

## 2021-10-07 NOTE — Plan of Care (Signed)
Continuing with plan of care. 

## 2021-10-08 ENCOUNTER — Inpatient Hospital Stay: Payer: Medicaid Other

## 2021-10-08 DIAGNOSIS — J9621 Acute and chronic respiratory failure with hypoxia: Secondary | ICD-10-CM | POA: Diagnosis not present

## 2021-10-08 DIAGNOSIS — J9622 Acute and chronic respiratory failure with hypercapnia: Secondary | ICD-10-CM | POA: Diagnosis not present

## 2021-10-08 LAB — RENAL FUNCTION PANEL
Albumin: 2.2 g/dL — ABNORMAL LOW (ref 3.5–5.0)
Anion gap: 10 (ref 5–15)
BUN: 128 mg/dL — ABNORMAL HIGH (ref 8–23)
CO2: 25 mmol/L (ref 22–32)
Calcium: 8.5 mg/dL — ABNORMAL LOW (ref 8.9–10.3)
Chloride: 102 mmol/L (ref 98–111)
Creatinine, Ser: 4.78 mg/dL — ABNORMAL HIGH (ref 0.61–1.24)
GFR, Estimated: 13 mL/min — ABNORMAL LOW (ref >60)
Glucose, Bld: 119 mg/dL — ABNORMAL HIGH (ref 70–99)
Phosphorus: 6.2 mg/dL — ABNORMAL HIGH (ref 2.5–4.6)
Potassium: 3.7 mmol/L (ref 3.5–5.1)
Sodium: 137 mmol/L (ref 135–145)

## 2021-10-08 LAB — GLUCOSE, CAPILLARY: Glucose-Capillary: 118 mg/dL — ABNORMAL HIGH (ref 70–99)

## 2021-10-08 LAB — MAGNESIUM: Magnesium: 1.8 mg/dL (ref 1.7–2.4)

## 2021-10-08 LAB — HEMOGLOBIN AND HEMATOCRIT, BLOOD
HCT: 26.5 % — ABNORMAL LOW (ref 39.0–52.0)
Hemoglobin: 7.7 g/dL — ABNORMAL LOW (ref 13.0–17.0)

## 2021-10-08 MED ORDER — GLYCOPYRROLATE 0.2 MG/ML IJ SOLN
0.4000 mg | Freq: Four times a day (QID) | INTRAMUSCULAR | Status: DC | PRN
  Administered 2021-10-08 – 2021-10-09 (×2): 0.4 mg via INTRAVENOUS
  Filled 2021-10-08 (×2): qty 2

## 2021-10-08 NOTE — Progress Notes (Signed)
[facility]  ROUNDING NOTE   Subjective:   Gary [name] is a 66 year old male with past medical conditions including COPD requiring 2 L nasal cannula, and congestive heart failure.  Patient presents to the emergency department with shortness of breath.  Patient has been admitted for COPD exacerbation ([name]) [J44.1] Obesity hypoventilation syndrome ([name]) [E66.2] Acute on chronic diastolic congestive heart failure ([name]) [I50.33] Acute respiratory failure with hypoxia and hypercapnia ([name]) [J96.01, J96.02] Acute on chronic respiratory failure with hypoxia and hypercapnia ([name]) [Z61.09, J96.22]  Patient is known and has recently been followed by Dr. Cherylann Ratel for acute kidney injury, resolved at his May appointment.    Patient was seen today in ICU, patient remains sedated, unable to offer any complaints   Objective:  Vital signs in last 24 hours:  Temp:  [96.4 F (35.8 C)-99.3 F (37.4 C)] 96.4 F (35.8 C) (06/25 0800) Pulse Rate:  [84-92] 85 (06/25 0800) Resp:  [18-22] 20 (06/25 0800) BP: (96-124)/(56-79) 113/77 (06/25 0800) SpO2:  [95 %-99 %] 96 % (06/25 0800) FiO2 (%):  [50 %-60 %] 50 % (06/25 0839) Weight:  [190 kg] 190 kg (06/25 0500)  Weight change: 4 kg Filed Weights   10/06/21 0346 10/07/21 0500 10/08/21 0500  Weight: (!) 176 kg (!) 186 kg (!) 190 kg    Intake/Output: I/O last 3 completed shifts: In: 1419.7 [I.V.:347.9; NG/GT:470; IV Piggyback:601.7] Out: 2450 [Urine:2450]   Intake/Output this shift:  No intake/output data recorded.  Physical Exam: General: Critically ill-appearing, morbidly obese gentleman, BMI of 42  Head: Normocephalic, atraumatic.   Eyes: Anicteric  Lungs:  Rhonchi throughout, vent and trach placed 10/03/21  Heart: Regular rhythm, tachycardic  Abdomen:  Soft, nontender, obese  Extremities: 2+ peripheral edema.  Neurologic: Sedated  Skin: No lesions  Access: Right IJ temp cath    Basic Metabolic Panel: Recent Labs  Lab  10/04/21 0447 10/05/21 0143 10/06/21 0343 10/07/21 0548 10/08/21 0538  NA 138 136 140 137 137  K 4.2 3.4* 3.4* 3.5 3.7  CL 103 102 103 101 102  CO2 26 27 26 27 25   GLUCOSE 84 94 98 113* 119*  BUN 98* 100* 98* 110* 128*  CREATININE 3.77* 3.86* 4.67* 4.62* 4.78*  CALCIUM 8.4* 8.1* 8.1* 8.2* 8.5*  MG 2.0 1.9 1.9 1.8 1.8  PHOS 5.8* 5.0* 6.5* 5.7* 6.2*    Liver Function Tests: Recent Labs  Lab 10/02/21 0349 10/06/21 0343 10/07/21 0548 10/08/21 0538  AST 13*  --   --   --   ALT 18  --   --   --   ALKPHOS 64  --   --   --   BILITOT 0.5  --   --   --   PROT 7.1  --   --   --   ALBUMIN 2.3* 2.0* 2.2* 2.2*   No results for input(s): "LIPASE", "AMYLASE" in the last 168 hours. No results for input(s): "AMMONIA" in the last 168 hours.  CBC: Recent Labs  Lab 10/02/21 0349 10/03/21 0505 10/03/21 1251 10/03/21 1814 10/04/21 0447 10/04/21 1203 10/05/21 0143 10/05/21 0959 10/06/21 0343 10/06/21 1309 10/06/21 2325 10/07/21 0548 10/07/21 1243  WBC 11.9* 14.4* 12.8*  --  11.4*  --  7.1  --  7.2  --   --  8.6  --   NEUTROABS 9.9* 12.1*  --   --   --   --   --   --   --   --   --  6.4  --   HGB 10.4* 10.4* 9.4*   < > 9.1*   < > 8.2*   < > 8.1* 8.1* 8.0* 8.2* 8.1*  HCT 35.9* 36.6* 33.4*   < > 31.3*   < > 28.0*   < > 28.3* 28.1* 27.7* 28.3* 27.8*  MCV 78.6* 79.9* 80.7  --  78.4*  --  76.9*  --  78.0*  --   --  77.1*  --   PLT 179 155 146*  --  143*  --  144*  --  186  --   --  187  --    < > = values in this interval not displayed.    Cardiac Enzymes: No results for input(s): "CKTOTAL", "CKMB", "CKMBINDEX", "TROPONINI" in the last 168 hours.  BNP: Invalid input(s): "POCBNP"  CBG: Recent Labs  Lab 10/05/21 2336 10/06/21 0335 10/06/21 0746 10/07/21 0802 10/08/21 0806  GLUCAP 97 100* 117* 104* 118*    Microbiology: Results for orders placed or performed during the hospital encounter of 09/19/21  SARS Coronavirus 2 by RT PCR (hospital order, performed in [facility]  hospital lab) *cepheid single result test* Anterior Nasal Swab     Status: None   Collection Time: 09/19/21  9:57 AM   Specimen: Anterior Nasal Swab  Result Value Ref Range Status   SARS Coronavirus 2 by RT PCR NEGATIVE NEGATIVE Final    Comment: (NOTE) SARS-CoV-2 target nucleic acids are NOT DETECTED.  The SARS-CoV-2 RNA is generally detectable in upper and lower respiratory specimens during the acute phase of infection. The lowest concentration of SARS-CoV-2 viral copies this assay can detect is 250 copies / mL. A negative result does not preclude SARS-CoV-2 infection and should not be used as the sole basis for treatment or other patient management decisions.  A negative result may occur with improper specimen collection / handling, submission of specimen other than nasopharyngeal swab, presence of viral mutation(s) within the areas targeted by this assay, and inadequate number of viral copies (<250 copies / mL). A negative result must be combined with clinical observations, patient history, and epidemiological information.  Fact Sheet for Patients:   RoadLapTop.co.za  Fact Sheet for Healthcare Providers: http://kim-miller.com/  This test is not yet approved or  cleared by the [place] FDA and has been authorized for detection and/or diagnosis of SARS-CoV-2 by FDA under an Emergency Use Authorization (EUA).  This EUA will remain in effect (meaning this test can be used) for the duration of the COVID-19 declaration under Section 564(b)(1) of the Act, 21 U.S.C. section 360bbb-3(b)(1), unless the authorization is terminated or revoked sooner.  Performed at [facility], [address] [place], [place] [address]   Culture, Respiratory w Gram Stain     Status: None   Collection Time: 09/19/21 11:55 AM   Specimen: SPU  Result Value Ref Range Status   Specimen Description   Final    SPUTUM Performed at [facility], [address] [place], [place] [address]    Special Requests   Final    NONE Performed at [facility], [address] [place], [place] [address]    Gram Stain   Final    FEW WBC PRESENT, PREDOMINANTLY PMN FEW GRAM POSITIVE COCCI IN PAIRS Performed at [facility], [address] [place], [place] [address]    Culture RARE [name]  Final   Report Status 09/24/2021 FINAL  Final   Organism ID, Bacteria PSEUDOMONAS  AERUGINOSA  Final      Susceptibility   [name] - MIC*    CEFTAZIDIME 4 SENSITIVE Sensitive     CIPROFLOXACIN <=0.25 SENSITIVE Sensitive     GENTAMICIN 2 SENSITIVE Sensitive     IMIPENEM 1 SENSITIVE Sensitive     PIP/TAZO 8 SENSITIVE Sensitive     CEFEPIME 2 SENSITIVE Sensitive     * RARE [name]  MRSA Next Gen by PCR, Nasal     Status: None   Collection Time: 09/19/21  6:10 PM   Specimen: Nasal Mucosa; Nasal Swab  Result Value Ref Range Status   MRSA by PCR Next Gen NOT DETECTED NOT DETECTED Final    Comment: (NOTE) The GeneXpert MRSA Assay (FDA approved for NASAL specimens only), is one component of a comprehensive MRSA colonization surveillance program. It is not intended to diagnose MRSA infection nor to guide or monitor treatment for MRSA infections. Test performance is not FDA approved in patients less than 65 years old. Performed at [facility], [address] [place], [place] [address]   Gastrointestinal Panel by PCR , Stool     Status: Abnormal   Collection Time: 09/21/21 10:53 AM   Specimen: Stool  Result Value Ref Range Status   Campylobacter species NOT DETECTED NOT DETECTED Final   Plesimonas shigelloides NOT DETECTED NOT DETECTED Final   Salmonella species NOT DETECTED NOT DETECTED Final   Yersinia enterocolitica DETECTED (A) NOT DETECTED Final    Comment: RESULT CALLED TO, READ BACK BY AND VERIFIED WITH: [facility] JACKSON AT 1126 09/22/21.PMF    Vibrio species NOT  DETECTED NOT DETECTED Final   Vibrio cholerae NOT DETECTED NOT DETECTED Final   Enteroaggregative E coli (EAEC) NOT DETECTED NOT DETECTED Final   Enteropathogenic E coli (EPEC) NOT DETECTED NOT DETECTED Final   Enterotoxigenic E coli (ETEC) NOT DETECTED NOT DETECTED Final   Shiga like toxin producing E coli (STEC) NOT DETECTED NOT DETECTED Final   Shigella/Enteroinvasive E coli (EIEC) NOT DETECTED NOT DETECTED Final   Cryptosporidium NOT DETECTED NOT DETECTED Final   Cyclospora cayetanensis NOT DETECTED NOT DETECTED Final   Entamoeba histolytica NOT DETECTED NOT DETECTED Final   Giardia lamblia NOT DETECTED NOT DETECTED Final   Adenovirus F40/41 NOT DETECTED NOT DETECTED Final   Astrovirus NOT DETECTED NOT DETECTED Final   Norovirus GI/GII NOT DETECTED NOT DETECTED Final   Rotavirus A NOT DETECTED NOT DETECTED Final   Sapovirus (I, II, IV, and V) NOT DETECTED NOT DETECTED Final    Comment: Performed at [facility], [address] [place], [place] [address]  C Difficile Quick Screen w PCR reflex     Status: None   Collection Time: 09/21/21 11:30 AM   Specimen: STOOL  Result Value Ref Range Status   C Diff antigen NEGATIVE NEGATIVE Final   C Diff toxin NEGATIVE NEGATIVE Final   C Diff interpretation No C. difficile detected.  Final    Comment: Performed at [facility], [address] [place], [place] [address]  Culture, blood (Routine X 2) w Reflex to ID Panel     Status: None   Collection Time: 09/21/21 12:28 PM   Specimen: BLOOD  Result Value Ref Range Status   Specimen Description BLOOD BLOOD RIGHT HAND  Final   Special Requests   Final    BOTTLES DRAWN AEROBIC AND ANAEROBIC Blood Culture adequate volume   Culture   Final    NO GROWTH 5 DAYS Performed at [facility],  [address], [place], [place] [address]    Report Status 09/26/2021 FINAL  Final  Culture, blood (Routine X 2) w Reflex to ID Panel     Status: None   Collection Time: 09/21/21   2:08 PM   Specimen: BLOOD  Result Value Ref Range Status   Specimen Description BLOOD BLOOD LEFT HAND  Final   Special Requests   Final    BOTTLES DRAWN AEROBIC AND ANAEROBIC Blood Culture adequate volume   Culture   Final    NO GROWTH 5 DAYS Performed at [facility], [address] [place], [place] [address]    Report Status 09/26/2021 FINAL  Final  Respiratory (~20 pathogens) panel by PCR     Status: None   Collection Time: 09/21/21  6:14 PM   Specimen: Nasopharyngeal Swab; Respiratory  Result Value Ref Range Status   Adenovirus NOT DETECTED NOT DETECTED Final   Coronavirus 229E NOT DETECTED NOT DETECTED Final    Comment: (NOTE) The Coronavirus on the Respiratory Panel, DOES NOT test for the novel  Coronavirus (2019 nCoV)    Coronavirus HKU1 NOT DETECTED NOT DETECTED Final   Coronavirus NL63 NOT DETECTED NOT DETECTED Final   Coronavirus OC43 NOT DETECTED NOT DETECTED Final   Metapneumovirus NOT DETECTED NOT DETECTED Final   Rhinovirus / Enterovirus NOT DETECTED NOT DETECTED Final   Influenza A NOT DETECTED NOT DETECTED Final   Influenza B NOT DETECTED NOT DETECTED Final   Parainfluenza Virus 1 NOT DETECTED NOT DETECTED Final   Parainfluenza Virus 2 NOT DETECTED NOT DETECTED Final   Parainfluenza Virus 3 NOT DETECTED NOT DETECTED Final   Parainfluenza Virus 4 NOT DETECTED NOT DETECTED Final   Respiratory Syncytial Virus NOT DETECTED NOT DETECTED Final   Bordetella pertussis NOT DETECTED NOT DETECTED Final   Bordetella Parapertussis NOT DETECTED NOT DETECTED Final   Chlamydophila pneumoniae NOT DETECTED NOT DETECTED Final   Mycoplasma pneumoniae NOT DETECTED NOT DETECTED Final    Comment: Performed at [facility], [address] [place], [place] [address]  Culture, Respiratory w Gram Stain     Status: None (Preliminary result)   Collection Time: 10/06/21 10:59 AM   Specimen: Tracheal Aspirate; Respiratory  Result Value Ref Range Status   Specimen  Description   Final    TRACHEAL ASPIRATE Performed at [facility], [address] [place], [place] [address]    Special Requests   Final    NONE Performed at [facility], [address] [place], [place] [address]    Gram Stain   Final    ABUNDANT WBC PRESENT, PREDOMINANTLY PMN ABUNDANT GRAM NEGATIVE RODS    Culture   Final    ABUNDANT [name] SUSCEPTIBILITIES TO FOLLOW NO STAPHYLOCOCCUS AUREUS ISOLATED Performed at [facility], [address], [place], [place] [address]    Report Status PENDING  Incomplete    Coagulation Studies: No results for input(s): "LABPROT", "INR" in the last 72 hours.  Urinalysis: No results for input(s): "COLORURINE", "LABSPEC", "PHURINE", "GLUCOSEU", "HGBUR", "BILIRUBINUR", "KETONESUR", "PROTEINUR", "UROBILINOGEN", "NITRITE", "LEUKOCYTESUR" in the last 72 hours.  Invalid input(s): "APPERANCEUR"    Imaging: DG Chest Port 1 View  Result Date: 10/08/2021 CLINICAL DATA:  Pneumonia EXAM: PORTABLE CHEST 1 VIEW COMPARISON:  October 06, 2021 chest x-ray FINDINGS: The right central line is stable. The NG tube is not well seen distally due to poor penetration. A tracheostomy tube is in good position. No pneumothorax. Stable cardiomegaly. The hila and  mediastinum are unchanged. Increased interstitial markings diffusely. No focal infiltrate on the left. Suspected effusion with underlying opacity on the right. IMPRESSION: 1. Cardiomegaly and pulmonary edema. 2. Support apparatus as above. 3. Suspected effusion with underlying opacity in the right base. Electronically Signed   By: Gerome Sam III M.D.   On: 10/08/2021 07:47   DG Abd 1 View  Result Date: 10/07/2021 CLINICAL DATA:  Encounter for orogastric tube placement EXAM: ABDOMEN - 1 VIEW COMPARISON:  Earlier the same day FINDINGS: Due to body habitus the enteric tube tip is difficult to visualize. Suspect that it at least reaches the proximal stomach and may be seen  over the lateral left abdomen, at the gastric body level. Low volume chest with hazy density at the bases from pulmonary opacity and right pleural effusion. IMPRESSION: Very limited study due to body habitus. An enteric tube tip likely reaches the stomach. Electronically Signed   By: Tiburcio Pea M.D.   On: 10/07/2021 07:20   DG Abd 1 View  Result Date: 10/07/2021 CLINICAL DATA:  OG tube placement. EXAM: ABDOMEN - 1 VIEW COMPARISON:  10/05/2021 FINDINGS: OG tube tip is in the proximal stomach with side port of the OG tube in the distal esophagus. Tube could be advanced 7-8 cm for placement of the side port in the stomach. Stable nonspecific bowel gas pattern. IMPRESSION: OG tube tip is in the proximal stomach with side port in the distal esophagus. Tube could be advanced 7-8 cm for placement of the side port in the stomach. Electronically Signed   By: Kennith Center M.D.   On: 10/07/2021 05:46   CT CHEST WO CONTRAST  Result Date: 10/06/2021 CLINICAL DATA:  Difficulty breathing EXAM: CT CHEST WITHOUT CONTRAST TECHNIQUE: Multidetector CT imaging of the chest was performed following the standard protocol without IV contrast. RADIATION DOSE REDUCTION: This exam was performed according to the departmental dose-optimization program which includes automated exposure control, adjustment of the mA and/or kV according to patient size and/or use of iterative reconstruction technique. COMPARISON:  CT done on 07/20/2021 and chest radiograph done earlier today FINDINGS: Cardiovascular: Coronary artery calcifications are seen. Heart is enlarged in size. Mediastinum/Nodes: There is shift of mediastinum to the right possibly due to decreased volume in right lung. No significant lymphadenopathy is seen in mediastinum. Enteric tube is noted traversing the esophagus. Tracheostomy tube is noted with its tip at the level of upper margin of aortic arch. Right IJ central venous catheter is seen with its tip in the superior vena  cava. Lungs/Pleura: Large infiltrate with air bronchogram is seen in the posterior right lung. There is decreased right lung volume. There is small to moderate infiltrate in the medial left lower lung fields. There is no significant pleural effusion. There is no pneumothorax. Upper Abdomen: Tip of enteric tube is in the medial aspect of the fundus of the stomach close to the gastroesophageal junction. Musculoskeletal: No acute findings are seen. IMPRESSION: There is large infiltrate in the posterior right lung with air bronchogram. There is decreased volume in right lung. There is small to moderate infiltrate in the medial left lower lobe with air bronchogram. Findings suggest atelectasis in both lungs, more so on the right side. Follow-up studies should be considered to assess resolution and to rule out any central obstructing neoplastic process. Coronary artery calcifications are seen.  Heart is enlarged in size. Tip of enteric tube is in the medial aspect of the fundus close to the gastroesophageal junction. Enteric tube could  be advanced 5-10 cm to place the tip and side port within the stomach. Electronically Signed   By: Ernie Avena M.D.   On: 10/06/2021 16:26     Medications:    sodium chloride Stopped (10/07/21 0542)   feeding supplement (NEPRO CARB STEADY) 70 mL/hr at 10/07/21 1800   fentaNYL infusion INTRAVENOUS 50 mcg/hr (10/08/21 0216)   meropenem (MERREM) IV Stopped (10/07/21 1716)   norepinephrine (LEVOPHED) Adult infusion Stopped (10/06/21 0053)    albuterol  2.5 mg Nebulization Q4H   aspirin  81 mg Per Tube Daily   atorvastatin  10 mg Per Tube Daily   budesonide (PULMICORT) nebulizer solution  0.5 mg Nebulization BID   Chlorhexidine Gluconate Cloth  6 each Topical Daily   cholecalciferol  2,000 Units Per Tube Daily   clonazepam  0.5 mg Per Tube BID   feeding supplement (PROSource TF)  90 mL Per Tube TID   free water  30 mL Per Tube Q4H   heparin injection (subcutaneous)   5,000 Units Subcutaneous Q8H   multivitamin  1 tablet Per Tube QHS   nutrition supplement (JUVEN)  1 packet Per Tube BID BM   mouth rinse  15 mL Mouth Rinse Q2H   oxyCODONE  10 mg Per Tube Q8H   pantoprazole  40 mg Intravenous Q12H   polyethylene glycol  17 g Per Tube BID   acetaminophen, acetaminophen, alteplase, bisacodyl, fentaNYL, glycopyrrolate, heparin, lidocaine (PF), lidocaine-prilocaine, midazolam, mouth rinse, pentafluoroprop-tetrafluoroeth  Assessment/ Plan:  Gary [name] is a 66 y.o.  male with past medical conditions including COPD requiring 2 L nasal cannula, and congestive heart failure.  Patient presents to the emergency department with shortness of breath.  Patient has been admitted for COPD exacerbation ([name]) [J44.1] Obesity hypoventilation syndrome ([name]) [E66.2] Acute on chronic diastolic congestive heart failure ([name]) [I50.33] Acute respiratory failure with hypoxia and hypercapnia ([name]) [J96.01, J96.02] Acute on chronic respiratory failure with hypoxia and hypercapnia ([name]) [W11.91, J96.22]    Acute Kidney Injury with hyperkalemia, cause unidentified.  No IV contrast exposure and brief hypotensive event. Normal renal function 2 days prior.Renal Ultrasound shows mild right obstruction and simple cyst in  left kidney.   -Patient was last dialyzed yesterday -Patient urine output is improving but patient bun remains more than 100 -Patient creatinine is also rising -We will plan for  next treatment scheduled for Monday. -We will plan for PermCath early next week. -Primary team consulting palliative care for goals of care discussion.  Lab Results  Component Value Date   CREATININE 4.78 (H) 10/08/2021   CREATININE 4.62 (H) 10/07/2021   CREATININE 4.67 (H) 10/06/2021    Intake/Output Summary (Last 24 hours) at 10/08/2021 0942 Last data filed at 10/08/2021 0529 Gross per 24 hour  Intake 599.49 ml  Output 2050 ml  Net -1450.51 ml   Acute respiratory  failure Ventilator assisted, trach placed on 6/20. FiO2 50% Completed IV antibiotic therapy Pseudomonas noted in sputum on September 19, 2021   Chronic diastolic heart failure.  Echo from 08/07/2021 shows EF 60 to 65% with a mildly dilated left ventricular cavity and grade 1 diastolic dysfunction. Home regimen of aspirin, carvedilol, Farxiga, simvastatin, torsemide All on hold at present.  Given IV furosemide and metolazone yesterday.  Anemia of critical illness, Hemoglobin is stable no need for PRBC    LOS: 19 Gary [name] 6/25/20239:42 AM

## 2021-10-08 NOTE — Progress Notes (Signed)
Patient moved to ICU room 1. Remained on cardiac monitoring, vent, and IV pain medications during move; tolerated move well.

## 2021-10-08 NOTE — Progress Notes (Signed)
Earlier in the shift patient was having occasional burst of bigeminy and trigeminy, however patient is asymptomatic, Dr. Marcos Eke updated on these findings and will continue to monitor patient. Around noontime patient's trialysis catheter dressing was changed and patient tolerated well.

## 2021-10-09 ENCOUNTER — Ambulatory Visit: Payer: Medicaid Other | Admitting: Family

## 2021-10-09 DIAGNOSIS — J9622 Acute and chronic respiratory failure with hypercapnia: Secondary | ICD-10-CM | POA: Diagnosis not present

## 2021-10-09 DIAGNOSIS — J9621 Acute and chronic respiratory failure with hypoxia: Secondary | ICD-10-CM | POA: Diagnosis not present

## 2021-10-09 LAB — RENAL FUNCTION PANEL
Albumin: 2.3 g/dL — ABNORMAL LOW (ref 3.5–5.0)
Anion gap: 12 (ref 5–15)
BUN: 128 mg/dL — ABNORMAL HIGH (ref 8–23)
CO2: 25 mmol/L (ref 22–32)
Calcium: 8.6 mg/dL — ABNORMAL LOW (ref 8.9–10.3)
Chloride: 104 mmol/L (ref 98–111)
Creatinine, Ser: 4.87 mg/dL — ABNORMAL HIGH (ref 0.61–1.24)
GFR, Estimated: 13 mL/min — ABNORMAL LOW (ref >60)
Glucose, Bld: 118 mg/dL — ABNORMAL HIGH (ref 70–99)
Phosphorus: 6.1 mg/dL — ABNORMAL HIGH (ref 2.5–4.6)
Potassium: 4.2 mmol/L (ref 3.5–5.1)
Sodium: 141 mmol/L (ref 135–145)

## 2021-10-09 LAB — GLUCOSE, CAPILLARY
Glucose-Capillary: 117 mg/dL — ABNORMAL HIGH (ref 70–99)
Glucose-Capillary: 118 mg/dL — ABNORMAL HIGH (ref 70–99)
Glucose-Capillary: 127 mg/dL — ABNORMAL HIGH (ref 70–99)

## 2021-10-09 LAB — HEMOGLOBIN AND HEMATOCRIT, BLOOD
HCT: 29 % — ABNORMAL LOW (ref 39.0–52.0)
Hemoglobin: 8.4 g/dL — ABNORMAL LOW (ref 13.0–17.0)

## 2021-10-09 LAB — CBC
HCT: 27.3 % — ABNORMAL LOW (ref 39.0–52.0)
Hemoglobin: 7.8 g/dL — ABNORMAL LOW (ref 13.0–17.0)
MCH: 22.3 pg — ABNORMAL LOW (ref 26.0–34.0)
MCHC: 28.6 g/dL — ABNORMAL LOW (ref 30.0–36.0)
MCV: 78.2 fL — ABNORMAL LOW (ref 80.0–100.0)
Platelets: 227 10*3/uL (ref 150–400)
RBC: 3.49 MIL/uL — ABNORMAL LOW (ref 4.22–5.81)
RDW: 16.8 % — ABNORMAL HIGH (ref 11.5–15.5)
WBC: 6.5 10*3/uL (ref 4.0–10.5)
nRBC: 0 % (ref 0.0–0.2)

## 2021-10-09 LAB — MAGNESIUM: Magnesium: 1.9 mg/dL (ref 1.7–2.4)

## 2021-10-09 LAB — CULTURE, RESPIRATORY W GRAM STAIN

## 2021-10-09 MED ORDER — HEPARIN SODIUM (PORCINE) 1000 UNIT/ML IJ SOLN
INTRAMUSCULAR | Status: AC
  Administered 2021-10-09: 4200 [IU] via INTRAVENOUS_CENTRAL
  Filled 2021-10-09: qty 10

## 2021-10-09 MED ORDER — HEPARIN SODIUM (PORCINE) 1000 UNIT/ML IJ SOLN
2000.0000 [IU] | Freq: Once | INTRAMUSCULAR | Status: AC
  Administered 2021-10-09: 2000 [IU] via INTRAVENOUS

## 2021-10-09 MED ORDER — EPOETIN ALFA 10000 UNIT/ML IJ SOLN
10000.0000 [IU] | INTRAMUSCULAR | Status: DC
  Administered 2021-10-11: 10000 [IU] via INTRAVENOUS
  Filled 2021-10-09: qty 1

## 2021-10-09 MED ORDER — SODIUM CHLORIDE 0.9 % IV SOLN
1.0000 g | INTRAVENOUS | Status: DC
  Administered 2021-10-09 – 2021-10-11 (×3): 1 g via INTRAVENOUS
  Filled 2021-10-09 (×3): qty 1

## 2021-10-09 MED ORDER — SODIUM CHLORIDE 0.9 % IV SOLN
1.0000 g | INTRAVENOUS | Status: DC
  Filled 2021-10-09: qty 10

## 2021-10-09 MED ORDER — GLYCOPYRROLATE 1 MG PO TABS
1.0000 mg | ORAL_TABLET | Freq: Three times a day (TID) | ORAL | Status: DC
Start: 2021-10-09 — End: 2021-10-12
  Administered 2021-10-09 – 2021-10-11 (×6): 1 mg
  Filled 2021-10-09 (×11): qty 1
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
## 2021-10-10 ENCOUNTER — Ambulatory Visit: Payer: Medicaid Other | Admitting: Family

## 2021-10-10 DIAGNOSIS — J9622 Acute and chronic respiratory failure with hypercapnia: Secondary | ICD-10-CM | POA: Diagnosis not present

## 2021-10-10 DIAGNOSIS — J9621 Acute and chronic respiratory failure with hypoxia: Secondary | ICD-10-CM | POA: Diagnosis not present

## 2021-10-10 LAB — CBC
HCT: 28.6 % — ABNORMAL LOW (ref 39.0–52.0)
Hemoglobin: 8.3 g/dL — ABNORMAL LOW (ref 13.0–17.0)
MCH: 23.1 pg — ABNORMAL LOW (ref 26.0–34.0)
MCHC: 29 g/dL — ABNORMAL LOW (ref 30.0–36.0)
MCV: 79.4 fL — ABNORMAL LOW (ref 80.0–100.0)
Platelets: 250 10*3/uL (ref 150–400)
RBC: 3.6 MIL/uL — ABNORMAL LOW (ref 4.22–5.81)
RDW: 16.9 % — ABNORMAL HIGH (ref 11.5–15.5)
WBC: 7.8 10*3/uL (ref 4.0–10.5)
nRBC: 0 % (ref 0.0–0.2)

## 2021-10-10 LAB — RENAL FUNCTION PANEL
Albumin: 2.5 g/dL — ABNORMAL LOW (ref 3.5–5.0)
Anion gap: 10 (ref 5–15)
BUN: 96 mg/dL — ABNORMAL HIGH (ref 8–23)
CO2: 27 mmol/L (ref 22–32)
Calcium: 8.6 mg/dL — ABNORMAL LOW (ref 8.9–10.3)
Chloride: 102 mmol/L (ref 98–111)
Creatinine, Ser: 3.44 mg/dL — ABNORMAL HIGH (ref 0.61–1.24)
GFR, Estimated: 19 mL/min — ABNORMAL LOW (ref >60)
Glucose, Bld: 131 mg/dL — ABNORMAL HIGH (ref 70–99)
Phosphorus: 4.4 mg/dL (ref 2.5–4.6)
Potassium: 3.5 mmol/L (ref 3.5–5.1)
Sodium: 139 mmol/L (ref 135–145)

## 2021-10-10 LAB — GLUCOSE, CAPILLARY
Glucose-Capillary: 126 mg/dL — ABNORMAL HIGH (ref 70–99)
Glucose-Capillary: 122 mg/dL — ABNORMAL HIGH (ref 70–99)
Glucose-Capillary: 144 mg/dL — ABNORMAL HIGH (ref 70–99)
Glucose-Capillary: 151 mg/dL — ABNORMAL HIGH (ref 70–99)
Glucose-Capillary: 128 mg/dL — ABNORMAL HIGH (ref 70–99)
Glucose-Capillary: 119 mg/dL — ABNORMAL HIGH (ref 70–99)

## 2021-10-10 LAB — HEMOGLOBIN AND HEMATOCRIT, BLOOD
HCT: 30.2 % — ABNORMAL LOW (ref 39.0–52.0)
Hemoglobin: 8.6 g/dL — ABNORMAL LOW (ref 13.0–17.0)

## 2021-10-10 LAB — MAGNESIUM: Magnesium: 1.8 mg/dL (ref 1.7–2.4)

## 2021-10-10 MED ORDER — ALBUTEROL SULFATE (2.5 MG/3ML) 0.083% IN NEBU
2.5000 mg | INHALATION_SOLUTION | Freq: Four times a day (QID) | RESPIRATORY_TRACT | Status: DC
  Administered 2021-10-10 – 2021-10-11 (×4): 2.5 mg via RESPIRATORY_TRACT
  Filled 2021-10-10 (×4): qty 3

## 2021-10-10 NOTE — Progress Notes (Signed)
                                                     Palliative Care Progress Note, Assessment & Plan   Patient Name: Gary [name]       Date: 10/10/2021 DOB: [birth year]  Age: 66 y.o. MRN#: [id number] Attending Physician: Erin Fulling, MD Primary Care Physician: [facility] Admit Date: 09/19/2021  Reason for Consultation/Follow-up: Establishing goals of care  HPI: 66 y.o. male  with past medical history of HFpEF, COPD (2 L nasal cannula), OSA, HTN, morbid obesity, multiple gastric ulcers, and tobacco abuse admitted on 09/19/2021 with shortness of breath.  Patient is being treated for acute metabolic encephalopathy, acute on chronic hypoxemia and hyper Respiratory failure, acute decompensated HF, and AECOPD.  6/20 tracheostomy was placed.  6/21 HD was performed but clotted off and was not able to be rinsed back.  6/23 patient is tolerating HD.  CT pending to determine if right-sided thoracentesis is needed. 6/26, pt is unable to be weened from vent.   Palliative medicine team was consulted to discuss goals of care.    Summary of counseling/coordination of care: After reviewing the patient's chart, goals are clear.  Plan is set for patient to be evaluated for LTAC placement. Full code remains.  Surrogate decision makers are majority of patient's sisters, who are heavily involved in his care.  PMT will shadow the patient's chart. Please contact PMT at patient/family's request, if goals change, or if patient's health status declines during hospitalization.  Thank you for allowing the Palliative Medicine Team to assist in the care of this patient.  Samara Deist L. Manon Hilding, FNP-BC Palliative Medicine Team Team Phone # [phone]

## 2021-10-10 NOTE — Progress Notes (Signed)
PHARMACY CONSULT NOTE  Pharmacy Consult for Electrolyte Monitoring and Replacement   Recent Labs: Potassium (mmol/L)  Date Value  10/10/2021 3.5  04/15/2014 3.9   Magnesium (mg/dL)  Date Value  43/32/9518 1.8  08/21/2012 1.9   Calcium (mg/dL)  Date Value  84/16/6063 8.6 (L)   Calcium, Total (mg/dL)  Date Value  01/60/1093 8.3 (L)   Albumin (g/dL)  Date Value  23/55/7322 2.5 (L)  04/15/2014 2.9 (L)   Phosphorus (mg/dL)  Date Value  02/54/2706 4.4   Sodium (mmol/L)  Date Value  10/10/2021 139  03/15/2020 145 (H)  04/15/2014 140    Assessment: 66 y/o male with h/o PUD, OSA, HTN, CKD III, substance abuse, CHF, Hep C and COPD and COVID 19 (02/2021) who is admitted with COPD exacerbation, Pseudomonas pneumonia on cefepime, HFpEF, and AKI. Intubated and sedated on fentanyl and Precedex. Pharmacy is asked to follow and replace electrolytes while in CCU  Goal of Therapy:  Electrolytes within normal limits  Plan:  --No replacement required today --Follow-up electrolytes with AM labs tomorrow  Lowella Bandy, PharmD 10/10/2021 6:58 AM

## 2021-10-10 NOTE — Progress Notes (Signed)
[facility]  ROUNDING NOTE   Subjective:   Gary [name] is a 66 year old male with past medical conditions including COPD requiring 2 L nasal cannula, and congestive heart failure.  Patient presents to the emergency department with shortness of breath.  Patient has been admitted for COPD exacerbation ([name]) [J44.1] Obesity hypoventilation syndrome ([name]) [E66.2] Acute on chronic diastolic congestive heart failure ([name]) [I50.33] Acute respiratory failure with hypoxia and hypercapnia ([name]) [J96.01, J96.02] Acute on chronic respiratory failure with hypoxia and hypercapnia ([name]) [E95.28, J96.22]  Patient is known and has recently been followed by Dr. Cherylann Ratel for acute kidney injury, resolved at his May appointment.    Patient seen and evaluated while in ICU Light sedation: fentanyl, able to open eyes and respond to simple questions.  Remains trached on vent with 40% FiO2 Tube feeds at 57ml/hr Improved lower extremity edema  UOP 2.3L  Objective:  Vital signs in last 24 hours:  Temp:  [97.7 F (36.5 C)-99.4 F (37.4 C)] 99 F (37.2 C) (06/27 0745) Pulse Rate:  [87-109] 106 (06/27 0830) Resp:  [16-25] 18 (06/27 0830) BP: (93-140)/(53-82) 126/81 (06/27 0830) SpO2:  [88 %-100 %] 95 % (06/27 0830) FiO2 (%):  [40 %] 40 % (06/27 0828) Weight:  [181 kg] 181 kg (06/26 2015)  Weight change: 2 kg Filed Weights   10/08/21 0500 10/09/21 0500 10/09/21 2015  Weight: (!) 190 kg (!) 179 kg (!) 181 kg    Intake/Output: I/O last 3 completed shifts: In: 1765.2 [I.V.:365.9; NG/GT:1257.7; IV Piggyback:141.6] Out: 4125 [Urine:3525; Other:600]   Intake/Output this shift:  Total I/O In: 129.5 [I.V.:42; NG/GT:87.5] Out: -   Physical Exam: General: Critically ill-appearing, morbidly obese gentleman, BMI of 42  Head: Normocephalic, atraumatic.   Eyes: Anicteric  Lungs:  Rhonchi throughout, vent and trach placed 10/03/21  Heart: Regular rhythm, tachycardic  Abdomen:  Soft,  nontender, obese  Extremities: 1+ peripheral edema.  Neurologic: Sedated  Skin: No lesions  Access: Right IJ temp cath    Basic Metabolic Panel: Recent Labs  Lab 10/06/21 0343 10/07/21 0548 10/08/21 0538 10/09/21 0339 10/10/21 0509  NA 140 137 137 141 139  K 3.4* 3.5 3.7 4.2 3.5  CL 103 101 102 104 102  CO2 26 27 25 25 27   GLUCOSE 98 113* 119* 118* 131*  BUN 98* 110* 128* 128* 96*  CREATININE 4.67* 4.62* 4.78* 4.87* 3.44*  CALCIUM 8.1* 8.2* 8.5* 8.6* 8.6*  MG 1.9 1.8 1.8 1.9 1.8  PHOS 6.5* 5.7* 6.2* 6.1* 4.4     Liver Function Tests: Recent Labs  Lab 10/06/21 0343 10/07/21 0548 10/08/21 0538 10/09/21 0339 10/10/21 0509  ALBUMIN 2.0* 2.2* 2.2* 2.3* 2.5*    No results for input(s): "LIPASE", "AMYLASE" in the last 168 hours. No results for input(s): "AMMONIA" in the last 168 hours.  CBC: Recent Labs  Lab 10/05/21 0143 10/05/21 0959 10/06/21 0343 10/06/21 1309 10/07/21 0548 10/07/21 1243 10/08/21 1955 10/09/21 0339 10/09/21 1820 10/10/21 0509  WBC 7.1  --  7.2  --  8.6  --   --  6.5  --  7.8  NEUTROABS  --   --   --   --  6.4  --   --   --   --   --   HGB 8.2*   < > 8.1*   < > 8.2* 8.1* 7.7* 7.8* 8.4* 8.3*  HCT 28.0*   < > 28.3*   < > 28.3* 27.8* 26.5* 27.3* 29.0* 28.6*  MCV 76.9*  --  78.0*  --  77.1*  --   --  78.2*  --  79.4*  PLT 144*  --  186  --  187  --   --  227  --  250   < > = values in this interval not displayed.     Cardiac Enzymes: No results for input(s): "CKTOTAL", "CKMB", "CKMBINDEX", "TROPONINI" in the last 168 hours.  BNP: Invalid input(s): "POCBNP"  CBG: Recent Labs  Lab 10/09/21 0719 10/09/21 1946 10/09/21 2339 10/10/21 0343 10/10/21 0738  GLUCAP 118* 127* 117* 126* 122*     Microbiology: Results for orders placed or performed during the hospital encounter of 09/19/21  SARS Coronavirus 2 by RT PCR (hospital order, performed in [facility] hospital lab) *cepheid single result test* Anterior Nasal Swab     Status:  None   Collection Time: 09/19/21  9:57 AM   Specimen: Anterior Nasal Swab  Result Value Ref Range Status   SARS Coronavirus 2 by RT PCR NEGATIVE NEGATIVE Final    Comment: (NOTE) SARS-CoV-2 target nucleic acids are NOT DETECTED.  The SARS-CoV-2 RNA is generally detectable in upper and lower respiratory specimens during the acute phase of infection. The lowest concentration of SARS-CoV-2 viral copies this assay can detect is 250 copies / mL. A negative result does not preclude SARS-CoV-2 infection and should not be used as the sole basis for treatment or other patient management decisions.  A negative result may occur with improper specimen collection / handling, submission of specimen other than nasopharyngeal swab, presence of viral mutation(s) within the areas targeted by this assay, and inadequate number of viral copies (<250 copies / mL). A negative result must be combined with clinical observations, patient history, and epidemiological information.  Fact Sheet for Patients:   RoadLapTop.co.za  Fact Sheet for Healthcare Providers: http://kim-miller.com/  This test is not yet approved or  cleared by the [place] FDA and has been authorized for detection and/or diagnosis of SARS-CoV-2 by FDA under an Emergency Use Authorization (EUA).  This EUA will remain in effect (meaning this test can be used) for the duration of the COVID-19 declaration under Section 564(b)(1) of the Act, 21 U.S.C. section 360bbb-3(b)(1), unless the authorization is terminated or revoked sooner.  Performed at [facility], [address] [place], [place] [address]   Culture, Respiratory w Gram Stain     Status: None   Collection Time: 09/19/21 11:55 AM   Specimen: SPU  Result Value Ref Range Status   Specimen Description   Final    SPUTUM Performed at [facility], [address] [place], [place] [address]    Special Requests    Final    NONE Performed at [facility], [address] [place], [place] [address]    Gram Stain   Final    FEW WBC PRESENT, PREDOMINANTLY PMN FEW GRAM POSITIVE COCCI IN PAIRS Performed at [facility], [address], [place], [place] [address]    Culture RARE [name]  Final   Report Status 09/24/2021 FINAL  Final   Organism ID, Bacteria [name]  Final      Susceptibility   [name] - MIC*    CEFTAZIDIME 4 SENSITIVE Sensitive     CIPROFLOXACIN <=0.25 SENSITIVE Sensitive     GENTAMICIN 2 SENSITIVE Sensitive     IMIPENEM 1 SENSITIVE Sensitive     PIP/TAZO 8 SENSITIVE Sensitive     CEFEPIME 2 SENSITIVE Sensitive     *  RARE [name]  MRSA Next Gen by PCR, Nasal     Status: None   Collection Time: 09/19/21  6:10 PM   Specimen: Nasal Mucosa; Nasal Swab  Result Value Ref Range Status   MRSA by PCR Next Gen NOT DETECTED NOT DETECTED Final    Comment: (NOTE) The GeneXpert MRSA Assay (FDA approved for NASAL specimens only), is one component of a comprehensive MRSA colonization surveillance program. It is not intended to diagnose MRSA infection nor to guide or monitor treatment for MRSA infections. Test performance is not FDA approved in patients less than 65 years old. Performed at [facility], [address] [place], [place] [address]   Gastrointestinal Panel by PCR , Stool     Status: Abnormal   Collection Time: 09/21/21 10:53 AM   Specimen: Stool  Result Value Ref Range Status   Campylobacter species NOT DETECTED NOT DETECTED Final   Plesimonas shigelloides NOT DETECTED NOT DETECTED Final   Salmonella species NOT DETECTED NOT DETECTED Final   Yersinia enterocolitica DETECTED (A) NOT DETECTED Final    Comment: RESULT CALLED TO, READ BACK BY AND VERIFIED WITH: [facility] JACKSON AT 1126 09/22/21.PMF    Vibrio species NOT DETECTED NOT DETECTED Final   Vibrio cholerae NOT DETECTED NOT DETECTED Final    Enteroaggregative E coli (EAEC) NOT DETECTED NOT DETECTED Final   Enteropathogenic E coli (EPEC) NOT DETECTED NOT DETECTED Final   Enterotoxigenic E coli (ETEC) NOT DETECTED NOT DETECTED Final   Shiga like toxin producing E coli (STEC) NOT DETECTED NOT DETECTED Final   Shigella/Enteroinvasive E coli (EIEC) NOT DETECTED NOT DETECTED Final   Cryptosporidium NOT DETECTED NOT DETECTED Final   Cyclospora cayetanensis NOT DETECTED NOT DETECTED Final   Entamoeba histolytica NOT DETECTED NOT DETECTED Final   Giardia lamblia NOT DETECTED NOT DETECTED Final   Adenovirus F40/41 NOT DETECTED NOT DETECTED Final   Astrovirus NOT DETECTED NOT DETECTED Final   Norovirus GI/GII NOT DETECTED NOT DETECTED Final   Rotavirus A NOT DETECTED NOT DETECTED Final   Sapovirus (I, II, IV, and V) NOT DETECTED NOT DETECTED Final    Comment: Performed at [facility], [address] [place], [place] [address]  C Difficile Quick Screen w PCR reflex     Status: None   Collection Time: 09/21/21 11:30 AM   Specimen: STOOL  Result Value Ref Range Status   C Diff antigen NEGATIVE NEGATIVE Final   C Diff toxin NEGATIVE NEGATIVE Final   C Diff interpretation No C. difficile detected.  Final    Comment: Performed at [facility], [address] [place], [place] [address]  Culture, blood (Routine X 2) w Reflex to ID Panel     Status: None   Collection Time: 09/21/21 12:28 PM   Specimen: BLOOD  Result Value Ref Range Status   Specimen Description BLOOD BLOOD RIGHT HAND  Final   Special Requests   Final    BOTTLES DRAWN AEROBIC AND ANAEROBIC Blood Culture adequate volume   Culture   Final    NO GROWTH 5 DAYS Performed at [facility], [address] [place], [place] [address]    Report Status 09/26/2021 FINAL  Final  Culture, blood (Routine X 2) w Reflex to ID Panel     Status: None   Collection Time: 09/21/21  2:08 PM   Specimen: BLOOD  Result Value Ref Range Status   Specimen  Description BLOOD BLOOD LEFT HAND  Final   Special Requests  Final    BOTTLES DRAWN AEROBIC AND ANAEROBIC Blood Culture adequate volume   Culture   Final    NO GROWTH 5 DAYS Performed at [facility], [address] [place], [place] [address]    Report Status 09/26/2021 FINAL  Final  Respiratory (~20 pathogens) panel by PCR     Status: None   Collection Time: 09/21/21  6:14 PM   Specimen: Nasopharyngeal Swab; Respiratory  Result Value Ref Range Status   Adenovirus NOT DETECTED NOT DETECTED Final   Coronavirus 229E NOT DETECTED NOT DETECTED Final    Comment: (NOTE) The Coronavirus on the Respiratory Panel, DOES NOT test for the novel  Coronavirus (2019 nCoV)    Coronavirus HKU1 NOT DETECTED NOT DETECTED Final   Coronavirus NL63 NOT DETECTED NOT DETECTED Final   Coronavirus OC43 NOT DETECTED NOT DETECTED Final   Metapneumovirus NOT DETECTED NOT DETECTED Final   Rhinovirus / Enterovirus NOT DETECTED NOT DETECTED Final   Influenza A NOT DETECTED NOT DETECTED Final   Influenza B NOT DETECTED NOT DETECTED Final   Parainfluenza Virus 1 NOT DETECTED NOT DETECTED Final   Parainfluenza Virus 2 NOT DETECTED NOT DETECTED Final   Parainfluenza Virus 3 NOT DETECTED NOT DETECTED Final   Parainfluenza Virus 4 NOT DETECTED NOT DETECTED Final   Respiratory Syncytial Virus NOT DETECTED NOT DETECTED Final   Bordetella pertussis NOT DETECTED NOT DETECTED Final   Bordetella Parapertussis NOT DETECTED NOT DETECTED Final   Chlamydophila pneumoniae NOT DETECTED NOT DETECTED Final   Mycoplasma pneumoniae NOT DETECTED NOT DETECTED Final    Comment: Performed at [facility], [address] [place], [place] [address]  Culture, Respiratory w Gram Stain     Status: None   Collection Time: 10/06/21 10:59 AM   Specimen: Tracheal Aspirate; Respiratory  Result Value Ref Range Status   Specimen Description   Final    TRACHEAL ASPIRATE Performed at [facility], [address], [place], [place] [address]    Special Requests   Final    NONE Performed at [facility], [address] [place], [place] [address]    Gram Stain   Final    ABUNDANT WBC PRESENT, PREDOMINANTLY PMN ABUNDANT GRAM NEGATIVE RODS Performed at [facility], [address], [place], [place] [address]    Culture ABUNDANT [name]  Final   Report Status 10/09/2021 FINAL  Final   Organism ID, Bacteria [name]  Final      Susceptibility   [name] - MIC*    CEFTAZIDIME 8 SENSITIVE Sensitive     CIPROFLOXACIN <=0.25 SENSITIVE Sensitive     GENTAMICIN 2 SENSITIVE Sensitive     IMIPENEM 1 SENSITIVE Sensitive     * ABUNDANT [name]    Coagulation Studies: No results for input(s): "LABPROT", "INR" in the last 72 hours.  Urinalysis: No results for input(s): "COLORURINE", "LABSPEC", "PHURINE", "GLUCOSEU", "HGBUR", "BILIRUBINUR", "KETONESUR", "PROTEINUR", "UROBILINOGEN", "NITRITE", "LEUKOCYTESUR" in the last 72 hours.  Invalid input(s): "APPERANCEUR"    Imaging: No results found.   Medications:    sodium chloride Stopped (10/07/21 0542)   cefTAZidime (FORTAZ)  IV 1 g (10/09/21 2130)   feeding supplement (NEPRO CARB STEADY) 70 mL/hr at 10/10/21 0800   fentaNYL infusion INTRAVENOUS 150 mcg/hr (10/10/21 0800)    albuterol  2.5 mg Nebulization Q6H   aspirin  81 mg Per Tube Daily   atorvastatin  10 mg Per Tube Daily   budesonide (PULMICORT) nebulizer solution  0.5 mg Nebulization BID   Chlorhexidine Gluconate Cloth  6 each Topical Daily   cholecalciferol  2,000 Units Per Tube Daily   clonazepam  0.5 mg Per Tube BID   [START ON 10/11/2021] epoetin (EPOGEN/PROCRIT) injection  10,000 Units Intravenous Q M,W,F-HD   feeding supplement (PROSource TF)  90 mL Per Tube TID   free water  30 mL Per Tube Q4H   glycopyrrolate  1 mg Per Tube TID   heparin injection (subcutaneous)  5,000 Units Subcutaneous Q8H   multivitamin  1  tablet Per Tube QHS   nutrition supplement (JUVEN)  1 packet Per Tube BID BM   mouth rinse  15 mL Mouth Rinse Q2H   oxyCODONE  10 mg Per Tube Q8H   pantoprazole  40 mg Intravenous Q12H   polyethylene glycol  17 g Per Tube BID   acetaminophen, acetaminophen, alteplase, bisacodyl, fentaNYL, glycopyrrolate, heparin, lidocaine (PF), lidocaine-prilocaine, midazolam, mouth rinse, pentafluoroprop-tetrafluoroeth  Assessment/ Plan:  Gary [name] is a 66 y.o.  male with past medical conditions including COPD requiring 2 L nasal cannula, and congestive heart failure.  Patient presents to the emergency department with shortness of breath.  Patient has been admitted for COPD exacerbation ([name]) [J44.1] Obesity hypoventilation syndrome ([name]) [E66.2] Acute on chronic diastolic congestive heart failure ([name]) [I50.33] Acute respiratory failure with hypoxia and hypercapnia ([name]) [J96.01, J96.02] Acute on chronic respiratory failure with hypoxia and hypercapnia ([name]) [W09.81, J96.22]    Acute Kidney Injury with hyperkalemia, cause unidentified.  No IV contrast exposure and brief hypotensive event. Normal renal function 2 days prior.Renal Ultrasound shows mild right obstruction , simple cyst in  left kidney and bladder distendion.   -Received dialysis yesterday, UF achieved. Due to adequate urine output, will keep UF targets low. Next treatment scheduled for Wednesday.  -Critical care team requesting permcath placement from vascular.    Lab Results  Component Value Date   CREATININE 3.44 (H) 10/10/2021   CREATININE 4.87 (H) 10/09/2021   CREATININE 4.78 (H) 10/08/2021    Intake/Output Summary (Last 24 hours) at 10/10/2021 1002 Last data filed at 10/10/2021 0800 Gross per 24 hour  Intake 1319.4 ml  Output 2200 ml  Net -880.6 ml    Acute respiratory failure, Pseudomonas noted in sputum on September 19, 2021. IV antibiotics completed Ventilator assisted, trach placed on 6/20. FiO2 40%   Chronic  diastolic heart failure.  Echo from 08/07/2021 shows EF 60 to 65% with a mildly dilated left ventricular cavity and grade 1 diastolic dysfunction. Home regimen of aspirin, carvedilol, Farxiga, simvastatin, torsemide All on hold at present.    Anemia of critical illness, Hemoglobin 8.3, below desired target. Continue EPO 10000 units IV with dialysis.     LOS: 21 Gary [name] 6/27/202310:02 AM

## 2021-10-10 NOTE — Progress Notes (Signed)
Trach Sutures removed, post op trach ENT note from 6/20 stated they needed to be removed in 7 days. Drawtex pad placed under trach flange and trach flange cleaned with sterile water and trach care kit. Annabelle Harman NP notified of suture removal.

## 2021-10-11 ENCOUNTER — Inpatient Hospital Stay: Payer: Medicaid Other

## 2021-10-11 ENCOUNTER — Ambulatory Visit: Payer: Medicaid Other | Admitting: Family

## 2021-10-11 ENCOUNTER — Encounter: Payer: Self-pay | Admitting: Internal Medicine

## 2021-10-11 DIAGNOSIS — J9622 Acute and chronic respiratory failure with hypercapnia: Secondary | ICD-10-CM | POA: Diagnosis not present

## 2021-10-11 DIAGNOSIS — J9621 Acute and chronic respiratory failure with hypoxia: Secondary | ICD-10-CM | POA: Diagnosis not present

## 2021-10-11 DIAGNOSIS — I5033 Acute on chronic diastolic (congestive) heart failure: Secondary | ICD-10-CM | POA: Diagnosis not present

## 2021-10-11 DIAGNOSIS — G9341 Metabolic encephalopathy: Secondary | ICD-10-CM | POA: Diagnosis not present

## 2021-10-11 LAB — BLOOD GAS, ARTERIAL
Acid-Base Excess: 0.4 mmol/L (ref 0.0–2.0)
Bicarbonate: 28 mmol/L (ref 20.0–28.0)
FIO2: 60 %
MECHVT: 510 mL
O2 Saturation: 95.7 %
PEEP: 10 cmH2O
Patient temperature: 37
RATE: 20 resp/min
pCO2 arterial: 57 mmHg — ABNORMAL HIGH (ref 32–48)
pH, Arterial: 7.3 — ABNORMAL LOW (ref 7.35–7.45)
pO2, Arterial: 72 mmHg — ABNORMAL LOW (ref 83–108)

## 2021-10-11 LAB — RENAL FUNCTION PANEL
Albumin: 2.5 g/dL — ABNORMAL LOW (ref 3.5–5.0)
Anion gap: 11 (ref 5–15)
BUN: 117 mg/dL — ABNORMAL HIGH (ref 8–23)
CO2: 26 mmol/L (ref 22–32)
Calcium: 9.2 mg/dL (ref 8.9–10.3)
Chloride: 104 mmol/L (ref 98–111)
Creatinine, Ser: 3.53 mg/dL — ABNORMAL HIGH (ref 0.61–1.24)
GFR, Estimated: 19 mL/min — ABNORMAL LOW (ref >60)
Glucose, Bld: 122 mg/dL — ABNORMAL HIGH (ref 70–99)
Phosphorus: 5 mg/dL — ABNORMAL HIGH (ref 2.5–4.6)
Potassium: 3.5 mmol/L (ref 3.5–5.1)
Sodium: 141 mmol/L (ref 135–145)

## 2021-10-11 LAB — CBC WITH DIFFERENTIAL/PLATELET
Abs Immature Granulocytes: 0.03 10*3/uL (ref 0.00–0.07)
Basophils Absolute: 0 10*3/uL (ref 0.0–0.1)
Basophils Relative: 0 %
Eosinophils Absolute: 0.2 10*3/uL (ref 0.0–0.5)
Eosinophils Relative: 2 %
HCT: 29.9 % — ABNORMAL LOW (ref 39.0–52.0)
Hemoglobin: 8.6 g/dL — ABNORMAL LOW (ref 13.0–17.0)
Immature Granulocytes: 0 %
Lymphocytes Relative: 11 %
Lymphs Abs: 0.8 10*3/uL (ref 0.7–4.0)
MCH: 22.8 pg — ABNORMAL LOW (ref 26.0–34.0)
MCHC: 28.8 g/dL — ABNORMAL LOW (ref 30.0–36.0)
MCV: 79.1 fL — ABNORMAL LOW (ref 80.0–100.0)
Monocytes Absolute: 0.8 10*3/uL (ref 0.1–1.0)
Monocytes Relative: 10 %
Neutro Abs: 5.9 10*3/uL (ref 1.7–7.7)
Neutrophils Relative %: 77 %
Platelets: 263 10*3/uL (ref 150–400)
RBC: 3.78 MIL/uL — ABNORMAL LOW (ref 4.22–5.81)
RDW: 17 % — ABNORMAL HIGH (ref 11.5–15.5)
WBC: 7.7 10*3/uL (ref 4.0–10.5)
nRBC: 0 % (ref 0.0–0.2)

## 2021-10-11 LAB — GLUCOSE, CAPILLARY
Glucose-Capillary: 125 mg/dL — ABNORMAL HIGH (ref 70–99)
Glucose-Capillary: 125 mg/dL — ABNORMAL HIGH (ref 70–99)
Glucose-Capillary: 104 mg/dL — ABNORMAL HIGH (ref 70–99)
Glucose-Capillary: 105 mg/dL — ABNORMAL HIGH (ref 70–99)
Glucose-Capillary: 105 mg/dL — ABNORMAL HIGH (ref 70–99)
Glucose-Capillary: 112 mg/dL — ABNORMAL HIGH (ref 70–99)

## 2021-10-11 LAB — MAGNESIUM: Magnesium: 2 mg/dL (ref 1.7–2.4)

## 2021-10-11 MED ORDER — MIDAZOLAM HCL 2 MG/2ML IJ SOLN
4.0000 mg | Freq: Once | INTRAMUSCULAR | Status: AC
  Administered 2021-10-11: 4 mg via INTRAVENOUS
  Filled 2021-10-11: qty 4

## 2021-10-11 MED ORDER — HEPARIN SODIUM (PORCINE) 1000 UNIT/ML IJ SOLN
INTRAMUSCULAR | Status: AC
  Filled 2021-10-11: qty 10

## 2021-10-11 MED ORDER — NEPRO/CARBSTEADY PO LIQD
1000.0000 mL | ORAL | Status: DC
  Administered 2021-10-11: 1000 mL

## 2021-10-11 MED ORDER — NEPRO/CARBSTEADY PO LIQD: 1000.0000 mL | ORAL | Status: DC

## 2021-10-11 MED ORDER — HYDROXYZINE HCL 10 MG/5ML PO SYRP
10.0000 mg | ORAL_SOLUTION | Freq: Three times a day (TID) | ORAL | Status: DC | PRN
  Filled 2021-10-11: qty 5

## 2021-10-11 MED ORDER — ALBUTEROL SULFATE (2.5 MG/3ML) 0.083% IN NEBU
2.5000 mg | INHALATION_SOLUTION | RESPIRATORY_TRACT | Status: DC | PRN
  Administered 2021-10-15 – 2021-12-12 (×7): 2.5 mg via RESPIRATORY_TRACT
  Filled 2021-10-11 (×8): qty 3

## 2021-10-11 MED ORDER — IPRATROPIUM-ALBUTEROL 0.5-2.5 (3) MG/3ML IN SOLN
3.0000 mL | Freq: Four times a day (QID) | RESPIRATORY_TRACT | Status: DC
  Administered 2021-10-11 – 2021-10-13 (×8): 3 mL via RESPIRATORY_TRACT
  Filled 2021-10-11 (×8): qty 3

## 2021-10-11 MED ORDER — HEPARIN SODIUM (PORCINE) 1000 UNIT/ML IJ SOLN
2000.0000 [IU] | INTRAMUSCULAR | Status: DC
  Administered 2021-10-11: 2000 [IU] via INTRAVENOUS
  Filled 2021-10-11: qty 2

## 2021-10-11 NOTE — Consult Note (Signed)
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 IMPRESSION: No evidence of bilateral lower extremity deep vein thrombosis. Ruthann Cancer, MD [facility] Radiology Electronically Signed   By: Ruthann Cancer M.D.   On: 09/21/2021 14:19   DG Chest Port 1 View  Result Date: 09/20/2021 CLINICAL DATA:  Respiratory failure. EXAM: PORTABLE CHEST 1 VIEW COMPARISON:  09/19/2021 FINDINGS: ET tube tip is above the carina. There is a nasogastric tube with tip and side port below the GE junction. Cardiac enlargement and diffuse pulmonary vascular congestion noted. Atelectasis is identified within the left lower lung. Remote left posterior rib deformities. IMPRESSION: 1. Cardiac enlargement and pulmonary vascular congestion. 2. Left lower lung atelectasis. Electronically Signed   By: Kerby Moors M.D.   On: 09/20/2021 06:06   CT HEAD WO CONTRAST (5MM)  Result Date: 09/19/2021 CLINICAL DATA:  Altered mental status EXAM: CT HEAD WITHOUT CONTRAST TECHNIQUE: Contiguous axial images were  obtained from the base of the skull through the vertex without intravenous contrast. RADIATION DOSE REDUCTION: This exam was performed according to the departmental dose-optimization program which includes automated exposure control, adjustment of the mA and/or kV according to patient size and/or use of iterative reconstruction technique. COMPARISON:  03/27/2014 FINDINGS: Brain: No acute intracranial findings are seen. There are no signs of bleeding. Ventricles are not dilated. There is no focal mass effect. Vascular: There are scattered arterial calcifications. Skull: No fracture is seen. Sinuses/Orbits: There is mucosal thickening in the ethmoid, sphenoid and maxillary sinuses. Other: Endotracheal tube and orogastric tube are noted. IMPRESSION: No acute intracranial findings are seen in noncontrast CT brain. Chronic sinusitis. Electronically Signed   By: Elmer Picker M.D.   On: 09/19/2021 17:22   DG Abdomen 1 View  Result Date: 09/19/2021 CLINICAL DATA:  NG tube placement EXAM: ABDOMEN - 1 VIEW COMPARISON:  02/18/2021 FINDINGS: Tip enteric tube is seen in the region pylorus or proximal duodenum. Lower abdomen and pelvis are not included. Bowel gas pattern in the upper abdomen is unremarkable. Transverse diameter of heart is increased. Linear densities seen in the lower lung fields. Possible old fractures are seen in multiple left ribs. IMPRESSION: Tip of enteric tube is seen in the right upper abdomen in the region of pylorus or proximal duodenum. Electronically Signed   By: Elmer Picker M.D.   On: 09/19/2021 12:50   DG Chest Port 1 View  Result Date: 09/19/2021 CLINICAL DATA:  Status post endotracheal tube and orogastric tube placement. EXAM: PORTABLE CHEST 1 VIEW COMPARISON:  Chest radiograph performed earlier on the same date. FINDINGS: Heart is enlarged. Pulmonary vascular congestion. Bilateral perihilar opacities concerning for pulmonary edema. Small bilateral effusions. Endotracheal tube  with distal tip approximately 3.5 cm above the carina. Feeding tube coursing below the diaphragm with distal tip projecting over the gastric pylorus. IMPRESSION: 1.  Cardiomegaly with pulmonary edema and small bilateral effusions. 2.  Endotracheal tube approximately 3.5 cm above the carina. Electronically Signed   By: Keane Police D.O.   On: 09/19/2021 12:50   DG Chest Portable 1 View  Result Date: 09/19/2021 CLINICAL DATA:  Respiratory distress EXAM: PORTABLE CHEST 1 VIEW COMPARISON:  08/08/2021 FINDINGS: Stable cardiomegaly. Low lung volumes. Pulmonary vascular congestion with mild diffuse interstitial prominence. No appreciable pleural fluid collection. No pneumothorax. IMPRESSION: Findings suggestive of CHF with mild interstitial edema. Electronically Signed   By: Davina Poke D.O.   On: 09/19/2021 10:26      Assessment/Plan 1.  Acute kidney Injury  Currently  the patient has acute kidney injury although the exact cause for this is unidentified.  The patient received dialysis, however there is anticipation that he will need to receive dialysis for an extended.  Based on this PermCath placement was requested.  We will plan on proceeding with PermCath placement on Thursday.  2.  Acute respiratory failure Ventilator assisted, trach placed on 10/03/2021    Family Communication: Discussed with Sister Annamary Carolin who consents to placement  Thank you for allowing [place] to participate in the care of this patient.   Kris Hartmann, NP [facility] [phone] (Office Phone) [phone] (Office Fax)  10/11/2021 9:59 AM  Staff may message me via secure chat in [place]  but this may not receive immediate response,  please page for urgent matters!  Dictation software was used to generate the above note. Typos may occur and escape review, as with typed/written notes. Any error is purely unintentional.  Please contact me directly for clarity if needed.
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
## 2021-10-11 NOTE — Progress Notes (Signed)
[facility]  ROUNDING NOTE   Subjective:   Gary [name] is a 66 year old male with past medical conditions including COPD requiring 2 L nasal cannula, and congestive heart failure.  Patient presents to the emergency department with shortness of breath.  Patient has been admitted for COPD exacerbation ([place]) [J44.1] Obesity hypoventilation syndrome ([place]) [E66.2] Acute on chronic diastolic congestive heart failure ([name]) [I50.33] Acute respiratory failure with hypoxia and hypercapnia ([name]) [J96.01, J96.02] Acute on chronic respiratory failure with hypoxia and hypercapnia ([name]) [F79.02, J96.22]  Patient is known and has recently been followed by Dr. Holley Raring for acute kidney injury, resolved at his May appointment.    Patient seen and evaluated while in ICU Light sedation: fentanyl, able to open eyes  Remains trached on vent with 40% FiO2 Tube feeds held No lower extremity edema  UOP 1.85L  Objective:  Vital signs in last 24 hours:  Temp:  [98.7 F (37.1 C)-99.8 F (37.7 C)] 99.8 F (37.7 C) (06/28 0800) Pulse Rate:  [63-109] 93 (06/28 1000) Resp:  [13-29] 22 (06/28 1000) BP: (85-137)/(57-86) 113/77 (06/28 1000) SpO2:  [91 %-100 %] 93 % (06/28 1134) FiO2 (%):  [40 %] 40 % (06/28 1134) Weight:  [409 kg] 167 kg (06/28 0500)  Weight change: -14 kg Filed Weights   10/09/21 0500 10/09/21 2015 10/11/21 0500  Weight: (!) 179 kg (!) 181 kg (!) 167 kg    Intake/Output: I/O last 3 completed shifts: In: 2511.4 [I.V.:495.2; NG/GT:1916.2; IV Piggyback:100] Out: 7353 [Urine:2500; Other:600]   Intake/Output this shift:  Total I/O In: 424.4 [I.V.:324.4; IV Piggyback:100] Out: -   Physical Exam: General: Critically ill-appearing, morbidly obese gentleman, BMI of 42  Head: Normocephalic, atraumatic.   Eyes: Anicteric  Lungs:  vent and trach placed 10/03/21  Heart: Regular rhythm  Abdomen:  Soft, nontender, obese  Extremities: No peripheral edema.  Neurologic: Alert,  respond to simple questions  Skin: No lesions  Access: Right IJ temp cath    Basic Metabolic Panel: Recent Labs  Lab 10/07/21 0548 10/08/21 0538 10/09/21 0339 10/10/21 0509 10/11/21 0441  NA 137 137 141 139 141  K 3.5 3.7 4.2 3.5 3.5  CL 101 102 104 102 104  CO2 '27 25 25 27 26  '$ GLUCOSE 113* 119* 118* 131* 122*  BUN 110* 128* 128* 96* 117*  CREATININE 4.62* 4.78* 4.87* 3.44* 3.53*  CALCIUM 8.2* 8.5* 8.6* 8.6* 9.2  MG 1.8 1.8 1.9 1.8 2.0  PHOS 5.7* 6.2* 6.1* 4.4 5.0*     Liver Function Tests: Recent Labs  Lab 10/07/21 0548 10/08/21 0538 10/09/21 0339 10/10/21 0509 10/11/21 0441  ALBUMIN 2.2* 2.2* 2.3* 2.5* 2.5*    No results for input(s): "LIPASE", "AMYLASE" in the last 168 hours. No results for input(s): "AMMONIA" in the last 168 hours.  CBC: Recent Labs  Lab 10/06/21 0343 10/06/21 1309 10/07/21 0548 10/07/21 1243 10/09/21 0339 10/09/21 1820 10/10/21 0509 10/10/21 1651 10/11/21 0441  WBC 7.2  --  8.6  --  6.5  --  7.8  --  7.7  NEUTROABS  --   --  6.4  --   --   --   --   --  5.9  HGB 8.1*   < > 8.2*   < > 7.8* 8.4* 8.3* 8.6* 8.6*  HCT 28.3*   < > 28.3*   < > 27.3* 29.0* 28.6* 30.2* 29.9*  MCV 78.0*  --  77.1*  --  78.2*  --  79.4*  --  79.1*  PLT 186  --  187  --  227  --  250  --  263   < > = values in this interval not displayed.     Cardiac Enzymes: No results for input(s): "CKTOTAL", "CKMB", "CKMBINDEX", "TROPONINI" in the last 168 hours.  BNP: Invalid input(s): "POCBNP"  CBG: Recent Labs  Lab 10/10/21 1610 10/10/21 1924 10/10/21 2323 10/11/21 0353 10/11/21 0737  GLUCAP 151* 128* 119* 125* 125*     Microbiology: Results for orders placed or performed during the hospital encounter of 09/19/21  SARS Coronavirus 2 by RT PCR (hospital order, performed in [facility] hospital lab) *cepheid single result test* Anterior Nasal Swab     Status: None   Collection Time: 09/19/21  9:57 AM   Specimen: Anterior Nasal Swab  Result Value Ref  Range Status   SARS Coronavirus 2 by RT PCR NEGATIVE NEGATIVE Final    Comment: (NOTE) SARS-CoV-2 target nucleic acids are NOT DETECTED.  The SARS-CoV-2 RNA is generally detectable in upper and lower respiratory specimens during the acute phase of infection. The lowest concentration of SARS-CoV-2 viral copies this assay can detect is 250 copies / mL. A negative result does not preclude SARS-CoV-2 infection and should not be used as the sole basis for treatment or other patient management decisions.  A negative result may occur with improper specimen collection / handling, submission of specimen other than nasopharyngeal swab, presence of viral mutation(s) within the areas targeted by this assay, and inadequate number of viral copies (<250 copies / mL). A negative result must be combined with clinical observations, patient history, and epidemiological information.  Fact Sheet for Patients:   https://www.patel.info/  Fact Sheet for Healthcare Providers: https://hall.com/  This test is not yet approved or  cleared by the [place] FDA and has been authorized for detection and/or diagnosis of SARS-CoV-2 by FDA under an Emergency Use Authorization (EUA).  This EUA will remain in effect (meaning this test can be used) for the duration of the COVID-19 declaration under Section 564(b)(1) of the Act, 21 U.S.C. section 360bbb-3(b)(1), unless the authorization is terminated or revoked sooner.  Performed at [facility], [address] [place], [place] [address]   Culture, Respiratory w Gram Stain     Status: None   Collection Time: 09/19/21 11:55 AM   Specimen: SPU  Result Value Ref Range Status   Specimen Description   Final    SPUTUM Performed at [facility], [place] [address], [place], [place] [address]    Special Requests   Final    NONE Performed at [facility], [place], [place] [address]     Gram Stain   Final    FEW WBC PRESENT, PREDOMINANTLY PMN FEW GRAM POSITIVE COCCI IN PAIRS Performed at [facility], [place] [address], [place], [place] [address]    Culture RARE [name]  Final   Report Status 09/24/2021 FINAL  Final   Organism ID, Bacteria [name]  Final      Susceptibility   [name] - MIC*    CEFTAZIDIME 4 SENSITIVE Sensitive     CIPROFLOXACIN <=0.25 SENSITIVE Sensitive     GENTAMICIN 2 SENSITIVE Sensitive     IMIPENEM 1 SENSITIVE Sensitive     PIP/TAZO 8 SENSITIVE Sensitive     CEFEPIME 2 SENSITIVE Sensitive     * RARE [name]  MRSA Next Gen by PCR, Nasal     Status: None   Collection  Time: 09/19/21  6:10 PM   Specimen: Nasal Mucosa; Nasal Swab  Result Value Ref Range Status   MRSA by PCR Next Gen NOT DETECTED NOT DETECTED Final    Comment: (NOTE) The GeneXpert MRSA Assay (FDA approved for NASAL specimens only), is one component of a comprehensive MRSA colonization surveillance program. It is not intended to diagnose MRSA infection nor to guide or monitor treatment for MRSA infections. Test performance is not FDA approved in patients less than 71 years old. Performed at [facility], [address] [place], [place] [address]   Gastrointestinal Panel by PCR , Stool     Status: Abnormal   Collection Time: 09/21/21 10:53 AM   Specimen: Stool  Result Value Ref Range Status   Campylobacter species NOT DETECTED NOT DETECTED Final   Plesimonas shigelloides NOT DETECTED NOT DETECTED Final   Salmonella species NOT DETECTED NOT DETECTED Final   Yersinia enterocolitica DETECTED (A) NOT DETECTED Final    Comment: RESULT CALLED TO, READ BACK BY AND VERIFIED WITH: [facility] JACKSON AT 1126 09/22/21.PMF    Vibrio species NOT DETECTED NOT DETECTED Final   Vibrio cholerae NOT DETECTED NOT DETECTED Final   Enteroaggregative E coli (EAEC) NOT DETECTED NOT DETECTED Final   Enteropathogenic E coli  (EPEC) NOT DETECTED NOT DETECTED Final   Enterotoxigenic E coli (ETEC) NOT DETECTED NOT DETECTED Final   Shiga like toxin producing E coli (STEC) NOT DETECTED NOT DETECTED Final   Shigella/Enteroinvasive E coli (EIEC) NOT DETECTED NOT DETECTED Final   Cryptosporidium NOT DETECTED NOT DETECTED Final   Cyclospora cayetanensis NOT DETECTED NOT DETECTED Final   Entamoeba histolytica NOT DETECTED NOT DETECTED Final   Giardia lamblia NOT DETECTED NOT DETECTED Final   Adenovirus F40/41 NOT DETECTED NOT DETECTED Final   Astrovirus NOT DETECTED NOT DETECTED Final   Norovirus GI/GII NOT DETECTED NOT DETECTED Final   Rotavirus A NOT DETECTED NOT DETECTED Final   Sapovirus (I, II, IV, and V) NOT DETECTED NOT DETECTED Final    Comment: Performed at [facility], [address] [place], [place] [address]  C Difficile Quick Screen w PCR reflex     Status: None   Collection Time: 09/21/21 11:30 AM   Specimen: STOOL  Result Value Ref Range Status   C Diff antigen NEGATIVE NEGATIVE Final   C Diff toxin NEGATIVE NEGATIVE Final   C Diff interpretation No C. difficile detected.  Final    Comment: Performed at [facility], [address] [place], [place] [address]  Culture, blood (Routine X 2) w Reflex to ID Panel     Status: None   Collection Time: 09/21/21 12:28 PM   Specimen: BLOOD  Result Value Ref Range Status   Specimen Description BLOOD BLOOD RIGHT HAND  Final   Special Requests   Final    BOTTLES DRAWN AEROBIC AND ANAEROBIC Blood Culture adequate volume   Culture   Final    NO GROWTH 5 DAYS Performed at [facility], [address], [place], [place] [address]    Report Status 09/26/2021 FINAL  Final  Culture, blood (Routine X 2) w Reflex to ID Panel     Status: None   Collection Time: 09/21/21  2:08 PM   Specimen: BLOOD  Result Value Ref Range Status   Specimen Description BLOOD BLOOD LEFT HAND  Final   Special Requests   Final    BOTTLES DRAWN AEROBIC AND  ANAEROBIC Blood Culture adequate volume   Culture  Final    NO GROWTH 5 DAYS Performed at [facility], [address] [place], [place] [address]    Report Status 09/26/2021 FINAL  Final  Respiratory (~20 pathogens) panel by PCR     Status: None   Collection Time: 09/21/21  6:14 PM   Specimen: Nasopharyngeal Swab; Respiratory  Result Value Ref Range Status   Adenovirus NOT DETECTED NOT DETECTED Final   Coronavirus 229E NOT DETECTED NOT DETECTED Final    Comment: (NOTE) The Coronavirus on the Respiratory Panel, DOES NOT test for the novel  Coronavirus (2019 nCoV)    Coronavirus HKU1 NOT DETECTED NOT DETECTED Final   Coronavirus NL63 NOT DETECTED NOT DETECTED Final   Coronavirus OC43 NOT DETECTED NOT DETECTED Final   Metapneumovirus NOT DETECTED NOT DETECTED Final   Rhinovirus / Enterovirus NOT DETECTED NOT DETECTED Final   Influenza A NOT DETECTED NOT DETECTED Final   Influenza B NOT DETECTED NOT DETECTED Final   Parainfluenza Virus 1 NOT DETECTED NOT DETECTED Final   Parainfluenza Virus 2 NOT DETECTED NOT DETECTED Final   Parainfluenza Virus 3 NOT DETECTED NOT DETECTED Final   Parainfluenza Virus 4 NOT DETECTED NOT DETECTED Final   Respiratory Syncytial Virus NOT DETECTED NOT DETECTED Final   Bordetella pertussis NOT DETECTED NOT DETECTED Final   Bordetella Parapertussis NOT DETECTED NOT DETECTED Final   Chlamydophila pneumoniae NOT DETECTED NOT DETECTED Final   Mycoplasma pneumoniae NOT DETECTED NOT DETECTED Final    Comment: Performed at [facility], [place] [address], [place], [place] [address]  Culture, Respiratory w Gram Stain     Status: None   Collection Time: 10/06/21 10:59 AM   Specimen: Tracheal Aspirate; Respiratory  Result Value Ref Range Status   Specimen Description   Final    TRACHEAL ASPIRATE Performed at [facility], [address], [place], [place] [address]    Special Requests   Final    NONE Performed at [facility],  [address] [place], [place] [address]    Gram Stain   Final    ABUNDANT WBC PRESENT, PREDOMINANTLY PMN ABUNDANT GRAM NEGATIVE RODS Performed at [facility], [place] [address] [place], [place] [address]    Culture ABUNDANT [name]  Final   Report Status 10/09/2021 FINAL  Final   Organism ID, Bacteria [name]  Final      Susceptibility   [name] - MIC*    CEFTAZIDIME 8 SENSITIVE Sensitive     CIPROFLOXACIN <=0.25 SENSITIVE Sensitive     GENTAMICIN 2 SENSITIVE Sensitive     IMIPENEM 1 SENSITIVE Sensitive     * ABUNDANT [name]    Coagulation Studies: No results for input(s): "LABPROT", "INR" in the last 72 hours.  Urinalysis: No results for input(s): "COLORURINE", "LABSPEC", "PHURINE", "GLUCOSEU", "HGBUR", "BILIRUBINUR", "KETONESUR", "PROTEINUR", "UROBILINOGEN", "NITRITE", "LEUKOCYTESUR" in the last 72 hours.  Invalid input(s): "APPERANCEUR"    Imaging: DG Abd 1 View  Result Date: 10/11/2021 CLINICAL DATA:  66 year old male status post nasogastric tube placement. EXAM: ABDOMEN - 1 VIEW COMPARISON:  10/11/2021. FINDINGS: Tip of nasogastric tube appears to be in the proximal stomach, with side port likely above the level of the gastroesophageal junction. Numerous dilated loops of bowel are noted in the abdomen which is incompletely imaged. IMPRESSION: 1. Tip of nasogastric tube is in the proximal stomach and should be advanced approximately 10 cm for more optimal placement. Electronically Signed   By: Vinnie Langton M.D.   On:  10/11/2021 07:29   DG Abd 1 View  Result Date: 10/11/2021 CLINICAL DATA:  NG tube placement. EXAM: ABDOMEN - 1 VIEW COMPARISON:  Radiograph 10/08/2018 FINDINGS: Tip of the enteric tube is below the diaphragm in the stomach. The side-port is not well visualized on the current exam due to habitus. Nonspecific upper abdominal bowel gas pattern IMPRESSION: Tip of the enteric tube below the  diaphragm in the stomach. The side-port is not well visualized Electronically Signed   By: Keith Rake M.D.   On: 10/11/2021 02:43     Medications:    sodium chloride Stopped (10/07/21 0542)   cefTAZidime (FORTAZ)  IV Stopped (10/10/21 2036)   feeding supplement (NEPRO CARB STEADY)     fentaNYL infusion INTRAVENOUS 150 mcg/hr (10/11/21 1000)    atorvastatin  10 mg Per Tube Daily   budesonide (PULMICORT) nebulizer solution  0.5 mg Nebulization BID   Chlorhexidine Gluconate Cloth  6 each Topical Daily   cholecalciferol  2,000 Units Per Tube Daily   clonazepam  0.5 mg Per Tube BID   epoetin (EPOGEN/PROCRIT) injection  10,000 Units Intravenous Q M,W,F-HD   feeding supplement (PROSource TF)  90 mL Per Tube TID   free water  30 mL Per Tube Q4H   glycopyrrolate  1 mg Per Tube TID   heparin injection (subcutaneous)  5,000 Units Subcutaneous Q8H   ipratropium-albuterol  3 mL Nebulization Q6H   multivitamin  1 tablet Per Tube QHS   nutrition supplement (JUVEN)  1 packet Per Tube BID BM   mouth rinse  15 mL Mouth Rinse Q2H   oxyCODONE  10 mg Per Tube Q8H   pantoprazole  40 mg Intravenous Q12H   polyethylene glycol  17 g Per Tube BID   acetaminophen, acetaminophen, albuterol, alteplase, bisacodyl, fentaNYL, glycopyrrolate, heparin, midazolam, mouth rinse, pentafluoroprop-tetrafluoroeth  Assessment/ Plan:  [name] is a 66 y.o.  male with past medical conditions including COPD requiring 2 L nasal cannula, and congestive heart failure.  Patient presents to the emergency department with shortness of breath.  Patient has been admitted for COPD exacerbation ([place]) [J44.1] Obesity hypoventilation syndrome ([place]) [E66.2] Acute on chronic diastolic congestive heart failure ([name]) [I50.33] Acute respiratory failure with hypoxia and hypercapnia ([name]) [J96.01, J96.02] Acute on chronic respiratory failure with hypoxia and hypercapnia ([name]) [V03.50, J96.22]    Acute Kidney Injury with  hyperkalemia, cause unidentified.  No IV contrast exposure and brief hypotensive event. Normal renal function 2 days prior.Renal Ultrasound shows mild right obstruction , simple cyst in  left kidney and bladder distendion.   -Continues to have good urine output however BUN remains elevated.  Plans to dialyze patient later today, no UF.  Patient may tend to have a higher BUN, will determine when to hold or decrease dialysis frequency to evaluate renal recovery. -PermCath placement requested by care team. -Nephrology agreeable to PEG tube placement   Lab Results  Component Value Date   CREATININE 3.53 (H) 10/11/2021   CREATININE 3.44 (H) 10/10/2021   CREATININE 4.87 (H) 10/09/2021    Intake/Output Summary (Last 24 hours) at 10/11/2021 1143 Last data filed at 10/11/2021 1000 Gross per 24 hour  Intake 1352.31 ml  Output 1700 ml  Net -347.69 ml    Acute respiratory failure, Pseudomonas noted in sputum on September 19, 2021. IV antibiotics completed Patient trached on ventilator at 40% FiO2   Chronic diastolic heart failure.  Echo from 08/07/2021 shows EF 60 to 65% with a mildly dilated left ventricular cavity  and grade 1 diastolic dysfunction. Home regimen of aspirin, carvedilol, Farxiga, simvastatin, torsemide All held  Anemia of critical illness, Hemoglobin 8.6, below desired target. Continue EPO 10000 units IV with dialysis.     LOS: [place] 6/28/202311:43 AM

## 2021-10-11 NOTE — Progress Notes (Signed)
[place] for Electrolyte Monitoring and Replacement   Recent Labs: Potassium (mmol/L)  Date Value  10/11/2021 3.5  04/15/2014 3.9   Magnesium (mg/dL)  Date Value  10/11/2021 2.0  08/21/2012 1.9   Calcium (mg/dL)  Date Value  10/11/2021 9.2   Calcium, Total (mg/dL)  Date Value  04/15/2014 8.3 (L)   Albumin (g/dL)  Date Value  10/11/2021 2.5 (L)  04/15/2014 2.9 (L)   Phosphorus (mg/dL)  Date Value  10/11/2021 5.0 (H)   Sodium (mmol/L)  Date Value  10/11/2021 141  03/15/2020 145 (H)  04/15/2014 140    Assessment: 66 y/o male with h/o PUD, OSA, HTN, CKD III, substance abuse, CHF, Hep C and COPD and COVID 19 (02/2021) who is admitted with COPD exacerbation, Pseudomonas pneumonia on ceftazidime, HFpEF, and AKI. Intubated and on fentanyl. Pharmacy is asked to follow and replace electrolytes while in CCU  Nutrition: Nepro '@70ml'$ /hr + ProSource TF 49m TID via tube    Free water flushes 321mq4 hours to maintain tube patency   Goal of Therapy:  Electrolytes within normal limits  Plan:  --No replacement required today --Follow-up electrolytes with AM labs 6/29  RoDallie PilesPharmD 10/11/2021 7:02 AM

## 2021-10-11 NOTE — TOC Progression Note (Signed)
Transition of Care [facility]) - Progression Note    Patient Details  Name: Gary [name] MRN: [id number] Date of Birth: Aug 18, 1955  [facility] CM/SW Contact  Shelbie Hutching, RN Phone Number: 10/11/2021, 11:02 AM  Clinical Narrative:    Damaris Schooner with Danise Mina with [facility], updated her on patient progress.  Patient is tolerating pressure support on the vent yesterday and today.  No trach collar trials yet, patient will get a PEG tube on Friday.  Patient is awake.  Per bedside RN the family wants to take the patient home, significant other is already rearranging furniture for a hospital bed to fit.  Danise Mina will pass all this information over to [facility] in [place].     Expected Discharge Plan: Long Term Acute Care (LTAC) Barriers to Discharge: Continued Medical Work up  Expected Discharge Plan and Services Expected Discharge Plan: [place] (LTAC)   Discharge Planning Services: CM Consult Post Acute Care Choice: Long Term Acute Care (LTAC) Living arrangements for the past 2 months: Single Family Home                 DME Arranged: N/A DME Agency: NA       HH Arranged: NA HH Agency: NA         Social Determinants of Health (SDOH) Interventions    Readmission Risk Interventions    05/04/2021    1:26 PM 04/07/2021    1:39 PM 03/12/2021   11:54 AM  Readmission Risk Prevention Plan  Transportation Screening Complete Complete Complete  PCP or Specialist Appt within 3-5 Days  Complete Complete  HRI or [place]   Complete  Social Work Consult for [place] Planning/Counseling   Complete  Palliative Care Screening  Not Applicable Not Applicable  Medication Review Press photographer) Complete Complete Complete  PCP or Specialist appointment within 3-5 days of discharge Complete    HRI or [place] Complete    SW Recovery Care/Counseling Consult Complete    [place] Not  Applicable

## 2021-10-11 NOTE — Progress Notes (Signed)
NG tube placement in question. Unable to auscultate air bolus. [name]. NP at bedside and aware.  All medication and tube feeds to be placed on hold until nasogastric tube placement is confirmed.

## 2021-10-11 NOTE — Progress Notes (Signed)
Treatment Date: 10/11/2021   Treatment Time: 2 hours 30 minutes   Access: Right IJ CVC   UF Removed: 10 ML   Next Treatment: 10/13/2021   Note:   Patient started treatment as ordered. Slight difficulty with CVC line resulting in high arterial pressure, BFR drop to 350, Colon Flattery, NP notified. HD treatment completed. Tolerated well, no adverse effects noted or reported. Patient hemodynamically stable. Report given to floor nurse Gregary Cromer, RN.
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
## 2021-10-11 NOTE — Progress Notes (Addendum)
Nutrition Follow-up  DOCUMENTATION CODES:   Morbid obesity  INTERVENTION:   Once access if placed, advance TF to:  Nepro '@70ml'$ /hr + ProSource TF 31m TID via tube    Free water flushes 320mq4 hours to maintain tube patency    Regimen provides 3264kcal/day, 203g/day protein and 140132may of free water.    Cholecalciferol 2000 units daily via tube    Juven Fruit Punch BID via tube, each serving provides 95kcal and 2.5g of protein (amino acids glutamine and arginine)   Rena-vit daily via tube   NUTRITION DIAGNOSIS:   Inadequate oral intake related to inability to eat (pt sedated and ventilated) as evidenced by NPO status.  Ongoing  GOAL:   Provide needs based on ASPEN/SCCM guidelines  Progressing   MONITOR:   Vent status, Labs, Weight trends, TF tolerance, Skin, I & O's  REASON FOR ASSESSMENT:   Ventilator    ASSESSMENT:   65 1o male with h/o PUD, OSA, HTN, CKD III, substance abuse, CHF, Hep C and COPD and COVID 19 (02/2021) who is admitted with COPD exacerbation, CHF and AKI.  6/20- s/p trach 6/22- NGT placed, tip of tube confirmed within the proximal stomach via x-ray  Patient is on ventilator support via trach MV: 9.9 L/min Temp (24hrs), Avg:99.3 F (37.4 C), Min:98.7 F (37.1 C), Max:99.8 F (37.7 C)  Reviewed I/O's: -529 ml x 24 hours and -8.9 L since 09/27/21  UOP: 1.9 L x 24 hours  Case discussed with RN, MD, and during ICU rounds. PLan for permacath on Thursday per vascular notes.   Per discussion with NP, pt with large amount of tan secretions; unsure if related to TF, tube positioning, and pseudomonas. Plan to decrease to trickle rate today. Plan for PEG on Friday.   ADDNEDUM: Case discussed with NP; NGT is not in position and pt dislodged it further. Plan to resume TF once pt gets his PEG.   Reviewed wt; no wt loss noted over the past week.   Medications reviewed and include vitamin D3, epogen, and miralax.   Labs reviewed: CBGS:  119-125.    Diet Order:   Diet Order     None       EDUCATION NEEDS:   No education needs have been identified at this time  Skin:  Skin Assessment: Skin Integrity Issues: Skin Integrity Issues:: Incisions, Stage II, Other (Comment) Stage II: medial perineum Incisions: closed neck s/p trach Other: MASD to rt groin  Last BM:  10/08/21  Height:   Ht Readings from Last 1 Encounters:  09/28/21 '6\' 3"'$  (1.905 m)    Weight:   Wt Readings from Last 1 Encounters:  10/11/21 (!) 167 kg    Ideal Body Weight:  89 kg  BMI:  Body mass index is 46.02 kg/m.  Estimated Nutritional Needs:   Kcal:  3000-3300kcal/day  Protein:  178-225g/day  Fluid:  2.0L/day    JenLoistine ChanceD, LDN, CDCMagas Arribagistered Dietitian II Certified Diabetes Care and Education Specialist Please refer to [facility] for RD and/or RD on-call/weekend/after hours pager

## 2021-10-11 NOTE — Progress Notes (Signed)
Updated pts Sister [name] via telephone regarding pts condition and current plan of care.  I also spoke with pts girlfriend Ardyth Gal via telephone and informed her he would like her to come and visit him at bedside.  Ms. Joya Gaskins acknowledged this and stated she would attempt to come to the hospital.    [name], [place] Pager [phone] (please enter 7 digits) [place] Pager [phone] (please enter 7 digits)

## 2021-10-11 NOTE — Progress Notes (Signed)
NG tube dislodged unsure how.  [name], NP notified.  Ordered received to pull NG tube and not to re-insert.

## 2021-10-11 NOTE — Progress Notes (Signed)
Progress Note  Overnight pt had episodes of vomiting TF's likely secondary to malpositioned NGT.  Pt has a hx of complications following NGT placement attempt.. Therefore, will hold TF's and all medications per tube for now.  Pt to undergo PEG tube placement per IR on 10/13/2021.  [name], [place] Pager [phone] (please enter 7 digits) PCCM Consult Pager [phone] (please enter 7 digits)

## 2021-10-12 ENCOUNTER — Encounter: Payer: Self-pay | Admitting: Anesthesiology

## 2021-10-12 ENCOUNTER — Inpatient Hospital Stay: Payer: Medicaid Other

## 2021-10-12 DIAGNOSIS — J9621 Acute and chronic respiratory failure with hypoxia: Secondary | ICD-10-CM | POA: Diagnosis not present

## 2021-10-12 DIAGNOSIS — J9622 Acute and chronic respiratory failure with hypercapnia: Secondary | ICD-10-CM | POA: Diagnosis not present

## 2021-10-12 LAB — GLUCOSE, CAPILLARY
Glucose-Capillary: 108 mg/dL — ABNORMAL HIGH (ref 70–99)
Glucose-Capillary: 115 mg/dL — ABNORMAL HIGH (ref 70–99)
Glucose-Capillary: 112 mg/dL — ABNORMAL HIGH (ref 70–99)
Glucose-Capillary: 98 mg/dL (ref 70–99)
Glucose-Capillary: 95 mg/dL (ref 70–99)
Glucose-Capillary: 103 mg/dL — ABNORMAL HIGH (ref 70–99)

## 2021-10-12 LAB — CBC WITH DIFFERENTIAL/PLATELET
Abs Immature Granulocytes: 0.03 10*3/uL (ref 0.00–0.07)
Basophils Absolute: 0 10*3/uL (ref 0.0–0.1)
Basophils Relative: 0 %
Eosinophils Absolute: 0.1 10*3/uL (ref 0.0–0.5)
Eosinophils Relative: 1 %
HCT: 30.8 % — ABNORMAL LOW (ref 39.0–52.0)
Hemoglobin: 8.8 g/dL — ABNORMAL LOW (ref 13.0–17.0)
Immature Granulocytes: 0 %
Lymphocytes Relative: 15 %
Lymphs Abs: 1.1 10*3/uL (ref 0.7–4.0)
MCH: 22.3 pg — ABNORMAL LOW (ref 26.0–34.0)
MCHC: 28.6 g/dL — ABNORMAL LOW (ref 30.0–36.0)
MCV: 78 fL — ABNORMAL LOW (ref 80.0–100.0)
Monocytes Absolute: 0.7 10*3/uL (ref 0.1–1.0)
Monocytes Relative: 10 %
Neutro Abs: 5.3 10*3/uL (ref 1.7–7.7)
Neutrophils Relative %: 74 %
Platelets: 273 10*3/uL (ref 150–400)
RBC: 3.95 MIL/uL — ABNORMAL LOW (ref 4.22–5.81)
RDW: 16.5 % — ABNORMAL HIGH (ref 11.5–15.5)
WBC: 7.3 10*3/uL (ref 4.0–10.5)
nRBC: 0 % (ref 0.0–0.2)

## 2021-10-12 LAB — RENAL FUNCTION PANEL
Albumin: 2.6 g/dL — ABNORMAL LOW (ref 3.5–5.0)
Anion gap: 11 (ref 5–15)
BUN: 81 mg/dL — ABNORMAL HIGH (ref 8–23)
CO2: 26 mmol/L (ref 22–32)
Calcium: 9.2 mg/dL (ref 8.9–10.3)
Chloride: 102 mmol/L (ref 98–111)
Creatinine, Ser: 2.6 mg/dL — ABNORMAL HIGH (ref 0.61–1.24)
GFR, Estimated: 27 mL/min — ABNORMAL LOW (ref >60)
Glucose, Bld: 114 mg/dL — ABNORMAL HIGH (ref 70–99)
Phosphorus: 3.9 mg/dL (ref 2.5–4.6)
Potassium: 3.4 mmol/L — ABNORMAL LOW (ref 3.5–5.1)
Sodium: 139 mmol/L (ref 135–145)

## 2021-10-12 LAB — MAGNESIUM: Magnesium: 1.9 mg/dL (ref 1.7–2.4)

## 2021-10-12 MED ORDER — CEFTAZIDIME 2 G IV SOLR
2.0000 g | Freq: Two times a day (BID) | INTRAVENOUS | Status: AC
  Administered 2021-10-12 – 2021-10-16 (×10): 2 g via INTRAVENOUS
  Filled 2021-10-12 (×11): qty 2

## 2021-10-12 MED ORDER — CEFAZOLIN SODIUM-DEXTROSE 1-4 GM/50ML-% IV SOLN: 1.0000 g | INTRAVENOUS | Status: DC

## 2021-10-12 MED ORDER — MIDAZOLAM HCL 2 MG/2ML IJ SOLN
2.0000 mg | INTRAMUSCULAR | Status: DC | PRN
Start: 2021-10-12 — End: 2021-10-18
  Administered 2021-10-12 – 2021-10-14 (×7): 2 mg via INTRAVENOUS
  Filled 2021-10-12 (×8): qty 2

## 2021-10-12 MED ORDER — DIPHENHYDRAMINE HCL 50 MG/ML IJ SOLN
12.5000 mg | Freq: Once | INTRAMUSCULAR | Status: DC
Start: 2021-10-12 — End: 2021-10-13
  Filled 2021-10-12: qty 1

## 2021-10-12 MED ORDER — DIPHENHYDRAMINE HCL 50 MG/ML IJ SOLN
50.0000 mg | Freq: Once | INTRAMUSCULAR | Status: AC | PRN
  Administered 2021-10-12: 50 mg via INTRAVENOUS

## 2021-10-12 MED ORDER — FAMOTIDINE 20 MG PO TABS: 40.0000 mg | ORAL_TABLET | Freq: Once | ORAL | Status: DC | PRN

## 2021-10-12 MED ORDER — SODIUM CHLORIDE 0.9 % IV SOLN: INTRAVENOUS | Status: DC

## 2021-10-12 MED ORDER — MIDAZOLAM HCL 2 MG/ML PO SYRP: 8.0000 mg | ORAL_SOLUTION | Freq: Once | ORAL | Status: DC | PRN

## 2021-10-12 MED ORDER — METHYLPREDNISOLONE SODIUM SUCC 125 MG IJ SOLR: 125.0000 mg | Freq: Once | INTRAMUSCULAR | Status: DC | PRN
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
## 2021-10-12 NOTE — Progress Notes (Signed)
[facility]  ROUNDING NOTE   Subjective:   Gary [name] is a 65 year old male with past medical conditions including COPD requiring 2 L nasal cannula, and congestive heart failure.  Patient presents to the emergency department with shortness of breath.  Patient has been admitted for COPD exacerbation ([place]) [J44.1] Obesity hypoventilation syndrome ([place]) [E66.2] Acute on chronic diastolic congestive heart failure ([name]) [I50.33] Acute respiratory failure with hypoxia and hypercapnia ([name]) [J96.01, J96.02] Acute on chronic respiratory failure with hypoxia and hypercapnia ([name]) [V74.82, J96.22]  Patient is known and has recently been followed by Dr. Holley Raring for acute kidney injury, resolved at his May appointment.    Update Patient seen and evaluated while in ICU Light sedation: fentanyl, able to open eyes, nod yes/no, attempts to mouth words  Remains trached on vent with 40% FiO2 No lower extremity edema  UOP 1.5L in 24 hours  Objective:  Vital signs in last 24 hours:  Temp:  [98.7 F (37.1 C)-99.5 F (37.5 C)] 99.3 F (37.4 C) (06/29 0405) Pulse Rate:  [39-108] 96 (06/29 0750) Resp:  [14-34] 22 (06/29 0750) BP: (95-181)/(59-137) 118/75 (06/29 0700) SpO2:  [90 %-100 %] 97 % (06/29 0750) FiO2 (%):  [40 %] 40 % (06/29 0750) Weight:  [159 kg] 159 kg (06/29 0500)  Weight change: -8 kg Filed Weights   10/11/21 0500 10/12/21 0500  Weight: (!) 167 kg (!) 159 kg    Intake/Output: I/O last 3 completed shifts: In: 528 [I.V.:428; IV Piggyback:100] Out: 2485 [Urine:2475; Other:10]   Intake/Output this shift:  Total I/O In: 413.2 [I.V.:412.8; IV Piggyback:0.4] Out: -   Physical Exam: General: Critically ill-appearing, morbidly obese gentleman, BMI of 42  Head: Normocephalic, atraumatic.   Eyes: Anicteric  Lungs:  vent and trach placed 10/03/21  Heart: Regular rhythm  Abdomen:  Soft, nontender, obese  Extremities: No peripheral edema.  Neurologic: Alert, respond  to simple questions  Skin: No lesions  Access: Right IJ temp cath    Basic Metabolic Panel: Recent Labs  Lab 10/08/21 0538 10/09/21 0339 10/10/21 0509 10/11/21 0441 10/12/21 0614  NA 137 141 139 141 139  K 3.7 4.2 3.5 3.5 3.4*  CL 102 104 102 104 102  CO2 '25 25 27 26 26  '$ GLUCOSE 119* 118* 131* 122* 114*  BUN 128* 128* 96* 117* 81*  CREATININE 4.78* 4.87* 3.44* 3.53* 2.60*  CALCIUM 8.5* 8.6* 8.6* 9.2 9.2  MG 1.8 1.9 1.8 2.0 1.9  PHOS 6.2* 6.1* 4.4 5.0* 3.9     Liver Function Tests: Recent Labs  Lab 10/08/21 0538 10/09/21 0339 10/10/21 0509 10/11/21 0441 10/12/21 0614  ALBUMIN 2.2* 2.3* 2.5* 2.5* 2.6*    No results for input(s): "LIPASE", "AMYLASE" in the last 168 hours. No results for input(s): "AMMONIA" in the last 168 hours.  CBC: Recent Labs  Lab 10/07/21 0548 10/07/21 1243 10/09/21 0339 10/09/21 1820 10/10/21 0509 10/10/21 1651 10/11/21 0441 10/12/21 0614  WBC 8.6  --  6.5  --  7.8  --  7.7 7.3  NEUTROABS 6.4  --   --   --   --   --  5.9 5.3  HGB 8.2*   < > 7.8* 8.4* 8.3* 8.6* 8.6* 8.8*  HCT 28.3*   < > 27.3* 29.0* 28.6* 30.2* 29.9* 30.8*  MCV 77.1*  --  78.2*  --  79.4*  --  79.1* 78.0*  PLT 187  --  227  --  250  --  263 273   < > =  values in this interval not displayed.     Cardiac Enzymes: No results for input(s): "CKTOTAL", "CKMB", "CKMBINDEX", "TROPONINI" in the last 168 hours.  BNP: Invalid input(s): "POCBNP"  CBG: Recent Labs  Lab 10/11/21 1633 10/11/21 1924 10/11/21 2305 10/12/21 0332 10/12/21 0733  GLUCAP 105* 105* 112* 108* 115*     Microbiology: Results for orders placed or performed during the hospital encounter of 09/19/21  SARS Coronavirus 2 by RT PCR (hospital order, performed in [facility] hospital lab) *cepheid single result test* Anterior Nasal Swab     Status: None   Collection Time: 09/19/21  9:57 AM   Specimen: Anterior Nasal Swab  Result Value Ref Range Status   SARS Coronavirus 2 by RT PCR NEGATIVE  NEGATIVE Final    Comment: (NOTE) SARS-CoV-2 target nucleic acids are NOT DETECTED.  The SARS-CoV-2 RNA is generally detectable in upper and lower respiratory specimens during the acute phase of infection. The lowest concentration of SARS-CoV-2 viral copies this assay can detect is 250 copies / mL. A negative result does not preclude SARS-CoV-2 infection and should not be used as the sole basis for treatment or other patient management decisions.  A negative result may occur with improper specimen collection / handling, submission of specimen other than nasopharyngeal swab, presence of viral mutation(s) within the areas targeted by this assay, and inadequate number of viral copies (<250 copies / mL). A negative result must be combined with clinical observations, patient history, and epidemiological information.  Fact Sheet for Patients:   https://www.patel.info/  Fact Sheet for Healthcare Providers: https://hall.com/  This test is not yet approved or  cleared by the [place] FDA and has been authorized for detection and/or diagnosis of SARS-CoV-2 by FDA under an Emergency Use Authorization (EUA).  This EUA will remain in effect (meaning this test can be used) for the duration of the COVID-19 declaration under Section 564(b)(1) of the Act, 21 U.S.C. section 360bbb-3(b)(1), unless the authorization is terminated or revoked sooner.  Performed at [facility], [address] [place], [place] [address]   Culture, Respiratory w Gram Stain     Status: None   Collection Time: 09/19/21 11:55 AM   Specimen: SPU  Result Value Ref Range Status   Specimen Description   Final    SPUTUM Performed at [facility], [place] [address], [place], [place] [address]    Special Requests   Final    NONE Performed at [facility], [place], [place] [address]    Gram Stain   Final    FEW WBC PRESENT,  PREDOMINANTLY PMN FEW GRAM POSITIVE COCCI IN PAIRS Performed at [facility], [place] [address], [place], [place] [address]    Culture RARE [name]  Final   Report Status 09/24/2021 FINAL  Final   Organism ID, Bacteria [name]  Final      Susceptibility   [name] - MIC*    CEFTAZIDIME 4 SENSITIVE Sensitive     CIPROFLOXACIN <=0.25 SENSITIVE Sensitive     GENTAMICIN 2 SENSITIVE Sensitive     IMIPENEM 1 SENSITIVE Sensitive     PIP/TAZO 8 SENSITIVE Sensitive     CEFEPIME 2 SENSITIVE Sensitive     * RARE [name]  MRSA Next Gen by PCR, Nasal     Status: None   Collection Time: 09/19/21  6:10 PM   Specimen: Nasal Mucosa; Nasal Swab  Result Value Ref Range Status   MRSA by PCR  Next Gen NOT DETECTED NOT DETECTED Final    Comment: (NOTE) The GeneXpert MRSA Assay (FDA approved for NASAL specimens only), is one component of a comprehensive MRSA colonization surveillance program. It is not intended to diagnose MRSA infection nor to guide or monitor treatment for MRSA infections. Test performance is not FDA approved in patients less than 12 years old. Performed at [facility], [address] [place], [place] [address]   Gastrointestinal Panel by PCR , Stool     Status: Abnormal   Collection Time: 09/21/21 10:53 AM   Specimen: Stool  Result Value Ref Range Status   Campylobacter species NOT DETECTED NOT DETECTED Final   Plesimonas shigelloides NOT DETECTED NOT DETECTED Final   Salmonella species NOT DETECTED NOT DETECTED Final   Yersinia enterocolitica DETECTED (A) NOT DETECTED Final    Comment: RESULT CALLED TO, READ BACK BY AND VERIFIED WITH: [facility] JACKSON AT 1126 09/22/21.PMF    Vibrio species NOT DETECTED NOT DETECTED Final   Vibrio cholerae NOT DETECTED NOT DETECTED Final   Enteroaggregative E coli (EAEC) NOT DETECTED NOT DETECTED Final   Enteropathogenic E coli (EPEC) NOT DETECTED NOT DETECTED Final    Enterotoxigenic E coli (ETEC) NOT DETECTED NOT DETECTED Final   Shiga like toxin producing E coli (STEC) NOT DETECTED NOT DETECTED Final   Shigella/Enteroinvasive E coli (EIEC) NOT DETECTED NOT DETECTED Final   Cryptosporidium NOT DETECTED NOT DETECTED Final   Cyclospora cayetanensis NOT DETECTED NOT DETECTED Final   Entamoeba histolytica NOT DETECTED NOT DETECTED Final   Giardia lamblia NOT DETECTED NOT DETECTED Final   Adenovirus F40/41 NOT DETECTED NOT DETECTED Final   Astrovirus NOT DETECTED NOT DETECTED Final   Norovirus GI/GII NOT DETECTED NOT DETECTED Final   Rotavirus A NOT DETECTED NOT DETECTED Final   Sapovirus (I, II, IV, and V) NOT DETECTED NOT DETECTED Final    Comment: Performed at [facility], [address] [place], [place] [address]  C Difficile Quick Screen w PCR reflex     Status: None   Collection Time: 09/21/21 11:30 AM   Specimen: STOOL  Result Value Ref Range Status   C Diff antigen NEGATIVE NEGATIVE Final   C Diff toxin NEGATIVE NEGATIVE Final   C Diff interpretation No C. difficile detected.  Final    Comment: Performed at [facility], [address] [place], [place] [address]  Culture, blood (Routine X 2) w Reflex to ID Panel     Status: None   Collection Time: 09/21/21 12:28 PM   Specimen: BLOOD  Result Value Ref Range Status   Specimen Description BLOOD BLOOD RIGHT HAND  Final   Special Requests   Final    BOTTLES DRAWN AEROBIC AND ANAEROBIC Blood Culture adequate volume   Culture   Final    NO GROWTH 5 DAYS Performed at [facility], [address], [place], [place] [address]    Report Status 09/26/2021 FINAL  Final  Culture, blood (Routine X 2) w Reflex to ID Panel     Status: None   Collection Time: 09/21/21  2:08 PM   Specimen: BLOOD  Result Value Ref Range Status   Specimen Description BLOOD BLOOD LEFT HAND  Final   Special Requests   Final    BOTTLES DRAWN AEROBIC AND ANAEROBIC Blood Culture adequate volume    Culture   Final    NO GROWTH 5 DAYS Performed at [facility], [address] [place], [place] [address]  Report Status 09/26/2021 FINAL  Final  Respiratory (~20 pathogens) panel by PCR     Status: None   Collection Time: 09/21/21  6:14 PM   Specimen: Nasopharyngeal Swab; Respiratory  Result Value Ref Range Status   Adenovirus NOT DETECTED NOT DETECTED Final   Coronavirus 229E NOT DETECTED NOT DETECTED Final    Comment: (NOTE) The Coronavirus on the Respiratory Panel, DOES NOT test for the novel  Coronavirus (2019 nCoV)    Coronavirus HKU1 NOT DETECTED NOT DETECTED Final   Coronavirus NL63 NOT DETECTED NOT DETECTED Final   Coronavirus OC43 NOT DETECTED NOT DETECTED Final   Metapneumovirus NOT DETECTED NOT DETECTED Final   Rhinovirus / Enterovirus NOT DETECTED NOT DETECTED Final   Influenza A NOT DETECTED NOT DETECTED Final   Influenza B NOT DETECTED NOT DETECTED Final   Parainfluenza Virus 1 NOT DETECTED NOT DETECTED Final   Parainfluenza Virus 2 NOT DETECTED NOT DETECTED Final   Parainfluenza Virus 3 NOT DETECTED NOT DETECTED Final   Parainfluenza Virus 4 NOT DETECTED NOT DETECTED Final   Respiratory Syncytial Virus NOT DETECTED NOT DETECTED Final   Bordetella pertussis NOT DETECTED NOT DETECTED Final   Bordetella Parapertussis NOT DETECTED NOT DETECTED Final   Chlamydophila pneumoniae NOT DETECTED NOT DETECTED Final   Mycoplasma pneumoniae NOT DETECTED NOT DETECTED Final    Comment: Performed at [facility], [address], [place], [place] [address]  Culture, Respiratory w Gram Stain     Status: None   Collection Time: 10/06/21 10:59 AM   Specimen: Tracheal Aspirate; Respiratory  Result Value Ref Range Status   Specimen Description   Final    TRACHEAL ASPIRATE Performed at [facility], [address] [place], [place] [address]    Special Requests   Final    NONE Performed at [facility], [address] [place], [place]  [address]    Gram Stain   Final    ABUNDANT WBC PRESENT, PREDOMINANTLY PMN ABUNDANT GRAM NEGATIVE RODS Performed at [facility], [place] [address], [place], [place] [address]    Culture ABUNDANT [name]  Final   Report Status 10/09/2021 FINAL  Final   Organism ID, Bacteria [name]  Final      Susceptibility   [name] - MIC*    CEFTAZIDIME 8 SENSITIVE Sensitive     CIPROFLOXACIN <=0.25 SENSITIVE Sensitive     GENTAMICIN 2 SENSITIVE Sensitive     IMIPENEM 1 SENSITIVE Sensitive     * ABUNDANT [name]    Coagulation Studies: No results for input(s): "LABPROT", "INR" in the last 72 hours.  Urinalysis: No results for input(s): "COLORURINE", "LABSPEC", "PHURINE", "GLUCOSEU", "HGBUR", "BILIRUBINUR", "KETONESUR", "PROTEINUR", "UROBILINOGEN", "NITRITE", "LEUKOCYTESUR" in the last 72 hours.  Invalid input(s): "APPERANCEUR"    Imaging: DG Abd 1 View  Result Date: 10/12/2021 CLINICAL DATA:  Encounter for NGT placement. EXAM: ABDOMEN - 1 VIEW COMPARISON:  Portable study yesterday at 3:52 p.m. FINDINGS: NGT is adequately intragastric with the side-hole several cm below the hiatus and tip directed cephalad along the medial crest of the hemidiaphragm. The bowel pattern is not well seen due to habitus. There are streaky opacities in the base of the lungs which could be atelectasis or infiltrates. The heart is enlarged. Small bowel dilatation in the upper to mid abdomen appears similar. No interval improvement or worsening. IMPRESSION: 1. Enteric tube appears adequately within the stomach. 2. Dilated air-filled small bowel in the upper abdomen is not significantly changed.  3. Streaky bibasilar lung opacities. Electronically Signed   By: Telford Nab M.D.   On: 10/12/2021 01:35   DG Abd 1 View  Result Date: 10/11/2021 CLINICAL DATA:  Enteric tube advancement. EXAM: ABDOMEN - 1 VIEW COMPARISON:  Abdominal x-ray from same day at 0652 hours.  FINDINGS: Interval advancement of the enteric tube with the side port now in the stomach and the tip at the fundus. Multiple dilated loops of air-filled small bowel in the upper abdomen are unchanged. IMPRESSION: 1. Enteric tube now in the stomach. 2. Multiple dilated loops of air-filled small bowel in the upper abdomen are unchanged. Correlate for ileus. Electronically Signed   By: Titus Dubin M.D.   On: 10/11/2021 16:13   DG Abd 1 View  Result Date: 10/11/2021 CLINICAL DATA:  66 year old male status post nasogastric tube placement. EXAM: ABDOMEN - 1 VIEW COMPARISON:  10/11/2021. FINDINGS: Tip of nasogastric tube appears to be in the proximal stomach, with side port likely above the level of the gastroesophageal junction. Numerous dilated loops of bowel are noted in the abdomen which is incompletely imaged. IMPRESSION: 1. Tip of nasogastric tube is in the proximal stomach and should be advanced approximately 10 cm for more optimal placement. Electronically Signed   By: Vinnie Langton M.D.   On: 10/11/2021 07:29   DG Abd 1 View  Result Date: 10/11/2021 CLINICAL DATA:  NG tube placement. EXAM: ABDOMEN - 1 VIEW COMPARISON:  Radiograph 10/08/2018 FINDINGS: Tip of the enteric tube is below the diaphragm in the stomach. The side-port is not well visualized on the current exam due to habitus. Nonspecific upper abdominal bowel gas pattern IMPRESSION: Tip of the enteric tube below the diaphragm in the stomach. The side-port is not well visualized Electronically Signed   By: Keith Rake M.D.   On: 10/11/2021 02:43     Medications:    sodium chloride Stopped (10/07/21 0542)   sodium chloride      ceFAZolin (ANCEF) IV     cefTAZidime (FORTAZ)  IV Stopped (10/11/21 2009)   feeding supplement (NEPRO CARB STEADY) Stopped (10/12/21 0030)   fentaNYL infusion INTRAVENOUS 200 mcg/hr (10/12/21 0800)    atorvastatin  10 mg Per Tube Daily   budesonide (PULMICORT) nebulizer solution  0.5 mg Nebulization  BID   Chlorhexidine Gluconate Cloth  6 each Topical Daily   cholecalciferol  2,000 Units Per Tube Daily   clonazepam  0.5 mg Per Tube BID   epoetin (EPOGEN/PROCRIT) injection  10,000 Units Intravenous Q M,W,F-HD   feeding supplement (PROSource TF)  90 mL Per Tube TID   free water  30 mL Per Tube Q4H   heparin sodium (porcine)  2,000 Units Intravenous Q M,W,F-HD   ipratropium-albuterol  3 mL Nebulization Q6H   multivitamin  1 tablet Per Tube QHS   nutrition supplement (JUVEN)  1 packet Per Tube BID BM   mouth rinse  15 mL Mouth Rinse Q2H   oxyCODONE  10 mg Per Tube Q8H   pantoprazole  40 mg Intravenous Q12H   polyethylene glycol  17 g Per Tube BID   acetaminophen, acetaminophen, albuterol, alteplase, bisacodyl, diphenhydrAMINE, famotidine, fentaNYL, glycopyrrolate, heparin, hydrOXYzine, methylPREDNISolone (SOLU-MEDROL) injection, midazolam, midazolam, mouth rinse, pentafluoroprop-tetrafluoroeth  Assessment/ Plan:  [name] is a 66 y.o.  male with past medical conditions including COPD requiring 2 L nasal cannula, and congestive heart failure.  Patient presents to the emergency department with shortness of breath.  Patient has been admitted for COPD exacerbation ([place]) [email or web address]]  Obesity hypoventilation syndrome ([name]) [E66.2] Acute on chronic diastolic congestive heart failure ([name]) [I50.33] Acute respiratory failure with hypoxia and hypercapnia ([name]) [J96.01, J96.02] Acute on chronic respiratory failure with hypoxia and hypercapnia ([name]) [Y85.02, J96.22]    Acute Kidney Injury with hyperkalemia, cause unidentified.  No IV contrast exposure and brief hypotensive event. Normal renal function 2 days prior.Renal Ultrasound shows mild right obstruction , simple cyst in  left kidney and bladder distendion.   -Continues to have good urine output however BUN remains elevated.  Plans to dialyze patient later today, no UF.  Patient may tend to have a higher BUN, will determine when to hold or  decrease dialysis frequency to evaluate renal recovery. -Permcath requested by primary team -Dialysis yesterday, no UF due to adequate urine output.  -Will continue dialysis due to elevated BUN. Next treatment scheduled for Friday   Lab Results  Component Value Date   CREATININE 2.60 (H) 10/12/2021   CREATININE 3.53 (H) 10/11/2021   CREATININE 3.44 (H) 10/10/2021    Intake/Output Summary (Last 24 hours) at 10/12/2021 1031 Last data filed at 10/12/2021 0800 Gross per 24 hour  Intake 516.74 ml  Output 1560 ml  Net -1043.26 ml    Acute respiratory failure, Pseudomonas noted in sputum on September 19, 2021. IV antibiotics completed Patient trached, remains on vent at 40% FiO2   Chronic diastolic heart failure.  Echo from 08/07/2021 shows EF 60 to 65% with a mildly dilated left ventricular cavity and grade 1 diastolic dysfunction. Home regimen of aspirin, carvedilol, Farxiga, simvastatin, torsemide All held  Anemia of critical illness, Hemoglobin 8.8, below desired target. Continue EPO 10000 units IV with dialysis.     LOS: [place] 6/29/202310:31 AM

## 2021-10-12 NOTE — Progress Notes (Signed)
Patient awake and following commands on fent.  No other sedation running .

## 2021-10-12 NOTE — Progress Notes (Signed)
[facility] for Electrolyte Monitoring and Replacement   Recent Labs: Potassium (mmol/L)  Date Value  10/12/2021 3.4 (L)  04/15/2014 3.9   Magnesium (mg/dL)  Date Value  10/12/2021 1.9  08/21/2012 1.9   Calcium (mg/dL)  Date Value  10/12/2021 9.2   Calcium, Total (mg/dL)  Date Value  04/15/2014 8.3 (L)   Albumin (g/dL)  Date Value  10/12/2021 2.6 (L)  04/15/2014 2.9 (L)   Phosphorus (mg/dL)  Date Value  10/12/2021 3.9   Sodium (mmol/L)  Date Value  10/12/2021 139  03/15/2020 145 (H)  04/15/2014 140    Assessment: 66 y/o male with h/o PUD, OSA, HTN, CKD III, substance abuse, CHF, Hep C and COPD and COVID 19 (02/2021) who is admitted with COPD exacerbation, Pseudomonas pneumonia on ceftazidime, HFpEF, and AKI. Intubated and on fentanyl. Pharmacy is asked to follow and replace electrolytes while in CCU  Nutrition: None at this time. Patient has removed NG tube multiple times  Goal of Therapy:  Electrolytes within normal limits  Plan:  --K 3.4, essentially normal. Will defer replacement at this time --Follow-up electrolytes with AM labs tomorrow  Benita Gutter 10/12/2021 7:51 AM

## 2021-10-12 NOTE — Progress Notes (Signed)
PHARMACY NOTE:  ANTIMICROBIAL RENAL DOSAGE ADJUSTMENT  Current antimicrobial regimen includes a mismatch between antimicrobial dosage and estimated renal function.  As per policy approved by the Pharmacy & Therapeutics and Medical Executive Committees, the antimicrobial dosage will be adjusted accordingly.  Current antimicrobial dosage: Ceftazidime 1 g IV q24h  Indication: Pseudomonas pneumonia  Renal Function:  Estimated Creatinine Clearance: 46.4 mL/min (A) (by C-G formula based on SCr of 2.6 mg/dL (H)). '[x]'$      On intermittent HD    Antimicrobial dosage has been changed to:  Ceftazidime 2 g IV q12h  Additional comments: Continues on iHD per nephrology for azotemia. Good UOP.    Thank you for allowing pharmacy to be a part of this patient's care.  Benita Gutter, [facility] 10/12/2021 1:44 PM

## 2021-10-12 NOTE — Progress Notes (Addendum)
Nutrition Follow Up Note   DOCUMENTATION CODES:   Morbid obesity  INTERVENTION:   Once G-tube in place and ready for use, recommend:   Nepro _0 /hr + ProSource TF 30m TID via tube   Free water flushes 360mq4 hours to maintain tube patency   Regimen provides 3264kcal/day, 202g/day protein and 140129may of free water.   Cholecalciferol 2000 units daily via tube   Juven Fruit Punch BID via tube, each serving provides 95kcal and 2.5g of protein (amino acids glutamine and arginine)  Rena-vit daily via tube   NUTRITION DIAGNOSIS:   Inadequate oral intake related to inability to eat (pt sedated and ventilated) as evidenced by NPO status.  GOAL:   Provide needs based on ASPEN/SCCM guidelines -previously met with tube feeds   MONITOR:   Vent status, Labs, Weight trends, TF tolerance, Skin, I & O's  ASSESSMENT:   65 23o male with h/o PUD, OSA, HTN, CKD III, substance abuse, CHF, Hep C and COPD and COVID 19 (02/2021) who is admitted with COPD exacerbation, CHF and AKI. 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  Pt s/p tracheostomy 6/20  Pt remains ventilated via trach. NGT has been replaced and removed by patient numerous times overnight. Plan is for IR G-tube tomorrow; will plan to resume tube feeds once G-tube is able to use. Pt continues on HD; plan is for perm cath today. Per chart, pt is down 26lbs from his UBW and since admission. Pt -7.3L on his I & Os.   Medications reviewed and include: vitamin D, epoetin, heparin, rena-vit, juven, oxycodone, protonix, miralax, fortaz  Labs reviewed: K 3.4(L), BUN 81(H), creat 2.60(H), P 3.9 wnl, Mg 1.9 wnl Hgb 8.8(L), Hct 30.8(L), MCV 78.0(L), MCH 22.3(L), MCHC 28.6(L) Cbgs- 115, 108 x 24 hrs  Patient is currently intubated on ventilator support MV: 10.7 L/min Temp (24hrs), Avg:99.2 F (37.3 C), Min:98.7 F (37.1 C), Max:99.5 F (37.5 C)  Propofol: none   MAP- >65m82m  UOP- 1550ml13m   Nutrition  Focused Physical Exam:  Flowsheet Row Most Recent Value  Orbital Region No depletion  Upper Arm Region No depletion  Thoracic and Lumbar Region No depletion  Buccal Region No depletion  Temple Region Mild depletion  Clavicle Bone Region Mild depletion  Clavicle and Acromion Bone Region No depletion  Scapular Bone Region No depletion  Dorsal Hand No depletion  Patellar Region No depletion  Anterior Thigh Region No depletion  Posterior Calf Region No depletion  Edema (RD Assessment) Moderate  Hair Reviewed  Eyes Reviewed  Mouth Reviewed  Skin Reviewed  Nails Reviewed   Diet Order:    Diet Order             Diet NPO time specified  Diet effective midnight           Diet NPO time specified  Diet effective midnight                  EDUCATION NEEDS:   No education needs have been identified at this time  Skin:  Skin Assessment: Skin Integrity Issues: Skin Integrity Issues:: Incisions, Stage II, Other (Comment) Stage II: medial perineum Incisions: closed neck s/p trach Other: MASD to rt groin  Last BM:  6/29  Height:   Ht Readings from Last 1 Encounters:  09/28/21 6' 3" (1.905 m)    Weight:   Wt Readings from Last 1 Encounters:  10/12/21 (!) 159 kg    Ideal Body Weight:  89  kg  BMI:  Body mass index is 43.81 kg/m.  Estimated Nutritional Needs:   Kcal:  3000-3300kcal/day  Protein:  178-225g/day  Fluid:  2.0L/day  Koleen Distance MS, RD, LDN Please refer to [facility] for RD and/or RD on-call/weekend/after hours pager

## 2021-10-13 ENCOUNTER — Inpatient Hospital Stay: Payer: Medicaid Other | Admitting: Radiology

## 2021-10-13 ENCOUNTER — Inpatient Hospital Stay: Payer: Medicaid Other

## 2021-10-13 DIAGNOSIS — G9341 Metabolic encephalopathy: Secondary | ICD-10-CM | POA: Diagnosis not present

## 2021-10-13 DIAGNOSIS — N1831 Chronic kidney disease, stage 3a: Secondary | ICD-10-CM | POA: Diagnosis not present

## 2021-10-13 DIAGNOSIS — I5033 Acute on chronic diastolic (congestive) heart failure: Secondary | ICD-10-CM | POA: Diagnosis not present

## 2021-10-13 DIAGNOSIS — J9621 Acute and chronic respiratory failure with hypoxia: Secondary | ICD-10-CM | POA: Diagnosis not present

## 2021-10-13 HISTORY — PX: IR GASTROSTOMY TUBE MOD SED: IMG625

## 2021-10-13 LAB — GLUCOSE, CAPILLARY
Glucose-Capillary: 105 mg/dL — ABNORMAL HIGH (ref 70–99)
Glucose-Capillary: 70 mg/dL (ref 70–99)
Glucose-Capillary: 76 mg/dL (ref 70–99)
Glucose-Capillary: 78 mg/dL (ref 70–99)
Glucose-Capillary: 88 mg/dL (ref 70–99)
Glucose-Capillary: 90 mg/dL (ref 70–99)

## 2021-10-13 LAB — CBC WITH DIFFERENTIAL/PLATELET
Abs Immature Granulocytes: 0.03 10*3/uL (ref 0.00–0.07)
Basophils Absolute: 0.1 10*3/uL (ref 0.0–0.1)
Basophils Relative: 1 %
Eosinophils Absolute: 0.2 10*3/uL (ref 0.0–0.5)
Eosinophils Relative: 4 %
HCT: 31.8 % — ABNORMAL LOW (ref 39.0–52.0)
Hemoglobin: 9 g/dL — ABNORMAL LOW (ref 13.0–17.0)
Immature Granulocytes: 1 %
Lymphocytes Relative: 21 %
Lymphs Abs: 1.3 10*3/uL (ref 0.7–4.0)
MCH: 22.6 pg — ABNORMAL LOW (ref 26.0–34.0)
MCHC: 28.3 g/dL — ABNORMAL LOW (ref 30.0–36.0)
MCV: 79.9 fL — ABNORMAL LOW (ref 80.0–100.0)
Monocytes Absolute: 0.9 10*3/uL (ref 0.1–1.0)
Monocytes Relative: 13 %
Neutro Abs: 3.9 10*3/uL (ref 1.7–7.7)
Neutrophils Relative %: 60 %
Platelets: 294 10*3/uL (ref 150–400)
RBC: 3.98 MIL/uL — ABNORMAL LOW (ref 4.22–5.81)
RDW: 17 % — ABNORMAL HIGH (ref 11.5–15.5)
WBC: 6.4 10*3/uL (ref 4.0–10.5)
nRBC: 0 % (ref 0.0–0.2)

## 2021-10-13 LAB — RENAL FUNCTION PANEL
Albumin: 2.5 g/dL — ABNORMAL LOW (ref 3.5–5.0)
Anion gap: 11 (ref 5–15)
BUN: 84 mg/dL — ABNORMAL HIGH (ref 8–23)
CO2: 28 mmol/L (ref 22–32)
Calcium: 9.2 mg/dL (ref 8.9–10.3)
Chloride: 103 mmol/L (ref 98–111)
Creatinine, Ser: 2.71 mg/dL — ABNORMAL HIGH (ref 0.61–1.24)
GFR, Estimated: 25 mL/min — ABNORMAL LOW (ref >60)
Glucose, Bld: 97 mg/dL (ref 70–99)
Phosphorus: 4.4 mg/dL (ref 2.5–4.6)
Potassium: 3.2 mmol/L — ABNORMAL LOW (ref 3.5–5.1)
Sodium: 142 mmol/L (ref 135–145)

## 2021-10-13 LAB — PROCALCITONIN: Procalcitonin: 0.69 ng/mL

## 2021-10-13 LAB — MAGNESIUM: Magnesium: 1.9 mg/dL (ref 1.7–2.4)

## 2021-10-13 MED ORDER — HYDROMORPHONE HCL 1 MG/ML IJ SOLN
0.5000 mg | INTRAMUSCULAR | Status: DC | PRN
  Administered 2021-10-17 – 2021-10-20 (×3): 1 mg via INTRAVENOUS
  Filled 2021-10-13 (×3): qty 1

## 2021-10-13 MED ORDER — MIDAZOLAM HCL 2 MG/2ML IJ SOLN
INTRAMUSCULAR | Status: AC
  Filled 2021-10-13: qty 4

## 2021-10-13 MED ORDER — POTASSIUM CHLORIDE 10 MEQ/100ML IV SOLN
10.0000 meq | INTRAVENOUS | Status: AC
  Administered 2021-10-13 (×2): 10 meq via INTRAVENOUS
  Filled 2021-10-13 (×2): qty 100

## 2021-10-13 MED ORDER — LIDOCAINE-EPINEPHRINE 1 %-1:100000 IJ SOLN
INTRAMUSCULAR | Status: AC
  Filled 2021-10-13: qty 1

## 2021-10-13 MED ORDER — CLONAZEPAM 0.5 MG PO TBDP
0.5000 mg | ORAL_TABLET | Freq: Two times a day (BID) | ORAL | Status: DC | PRN
Start: 2021-10-13 — End: 2021-11-05
  Administered 2021-10-14 – 2021-10-31 (×15): 0.5 mg
  Filled 2021-10-13 (×3): qty 1
  Filled 2021-10-13 (×3): qty 2
  Filled 2021-10-13 (×4): qty 1
  Filled 2021-10-13: qty 2
  Filled 2021-10-13: qty 1
  Filled 2021-10-13 (×2): qty 2
  Filled 2021-10-13 (×2): qty 1

## 2021-10-13 MED ORDER — GLUCAGON HCL RDNA (DIAGNOSTIC) 1 MG IJ SOLR
INTRAMUSCULAR | Status: AC | PRN
  Administered 2021-10-13: 1 mg via INTRAVENOUS

## 2021-10-13 MED ORDER — MIDAZOLAM HCL 2 MG/2ML IJ SOLN
INTRAMUSCULAR | Status: AC | PRN
  Administered 2021-10-13: 1 mg via INTRAVENOUS

## 2021-10-13 MED ORDER — LIDOCAINE HCL 1 % IJ SOLN
INTRAMUSCULAR | Status: AC
  Administered 2021-10-13: 10 mL
  Filled 2021-10-13: qty 20

## 2021-10-13 MED ORDER — DEXTROSE 10 % IV SOLN: INTRAVENOUS | Status: DC

## 2021-10-13 MED ORDER — GLUCAGON HCL RDNA (DIAGNOSTIC) 1 MG IJ SOLR
INTRAMUSCULAR | Status: AC
  Filled 2021-10-13: qty 1

## 2021-10-13 MED ORDER — LIDOCAINE VISCOUS HCL 2 % MT SOLN
OROMUCOSAL | Status: AC
  Filled 2021-10-13: qty 15

## 2021-10-13 MED ORDER — FENTANYL CITRATE (PF) 100 MCG/2ML IJ SOLN
INTRAMUSCULAR | Status: AC
  Filled 2021-10-13: qty 2

## 2021-10-13 MED ORDER — LIDOCAINE HCL 1 % IJ SOLN
INTRAMUSCULAR | Status: AC
  Administered 2021-10-13: 20 mL
  Filled 2021-10-13: qty 20

## 2021-10-13 MED ORDER — FENTANYL CITRATE (PF) 100 MCG/2ML IJ SOLN
INTRAMUSCULAR | Status: AC | PRN
  Administered 2021-10-13: 25 ug via INTRAVENOUS
  Administered 2021-10-13: 50 ug via INTRAVENOUS

## 2021-10-13 MED ORDER — IOHEXOL 350 MG/ML SOLN
25.0000 mL | Freq: Once | INTRAVENOUS | Status: AC | PRN
Start: 2021-10-13 — End: 2021-10-13
  Administered 2021-10-13: 25 mL

## 2021-10-13 MED ORDER — IPRATROPIUM-ALBUTEROL 0.5-2.5 (3) MG/3ML IN SOLN
3.0000 mL | Freq: Two times a day (BID) | RESPIRATORY_TRACT | Status: DC
  Administered 2021-10-13 – 2021-10-29 (×32): 3 mL via RESPIRATORY_TRACT
  Filled 2021-10-13 (×32): qty 3
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
## 2021-10-13 NOTE — Procedures (Signed)
Interventional Radiology Procedure Note  Date of Procedure: 10/13/2021  Procedure: G tube placement   Findings:  1. G tube placement, 20 Fr balloon retention    Complications: No immediate complications noted.   Estimated Blood Loss: minimal  Follow-up and Recommendations: 1. Refer to instructions in EMR for use    Albin Felling, MD  Vascular & Interventional Radiology  10/13/2021 3:11 PM

## 2021-10-13 NOTE — Progress Notes (Addendum)
Gary [name] for Electrolyte Monitoring and Replacement   Recent Labs: Potassium (mmol/L)  Date Value  10/13/2021 3.2 (L)  04/15/2014 3.9   Magnesium (mg/dL)  Date Value  10/13/2021 1.9  08/21/2012 1.9   Calcium (mg/dL)  Date Value  10/13/2021 9.2   Calcium, Total (mg/dL)  Date Value  04/15/2014 8.3 (L)   Albumin (g/dL)  Date Value  10/13/2021 2.5 (L)  04/15/2014 2.9 (L)   Phosphorus (mg/dL)  Date Value  10/13/2021 4.4   Sodium (mmol/L)  Date Value  10/13/2021 142  03/15/2020 145 (H)  04/15/2014 140    Assessment: 66 y/o male with h/o PUD, OSA, HTN, CKD III, substance abuse, CHF, Hep C and COPD and COVID 19 (02/2021) who is admitted with COPD exacerbation, Pseudomonas pneumonia on ceftazidime, HFpEF, and AKI. Intubated and on fentanyl. Pharmacy is asked to follow and replace electrolytes while in CCU  Nutrition: None at this time. Patient has removed NG tube multiple times. Pending PEG tube placement  Goal of Therapy:  Electrolytes within normal limits  Plan:  --K 3.2, IV Kcl 10 mEq x 2 --Follow-up electrolytes with AM labs tomorrow  Benita Gutter 10/13/2021 7:41 AM

## 2021-10-13 NOTE — Sedation Documentation (Signed)
FIO2 increased to 100% secondary to O2 sat down to 87% after sedation given.

## 2021-10-13 NOTE — Progress Notes (Signed)
Actual time out at 14:34 secondary to MD stepped out of room earlier.

## 2021-10-13 NOTE — Progress Notes (Signed)
[facility]  ROUNDING NOTE   Subjective:   Gary [name] is a 66 year old male with past medical conditions including COPD requiring 2 L nasal cannula, and congestive heart failure.  Patient presents to the emergency department with shortness of breath.  Patient has been admitted for COPD exacerbation ([place]) [J44.1] Obesity hypoventilation syndrome ([place]) [E66.2] Acute on chronic diastolic congestive heart failure ([name]) [I50.33] Acute respiratory failure with hypoxia and hypercapnia ([name]) [J96.01, J96.02] Acute on chronic respiratory failure with hypoxia and hypercapnia ([name]) [W73.71, J96.22]  Patient is known and has recently been followed by Dr. Holley Raring for acute kidney injury, resolved at his May appointment.    Update Patient seen resting comfortably  Remains trached with vent support-40% FiO2 Awaiting PEG placement Light sedation with Fentanyl   UOP 1.45L in 24 hours  Objective:  Vital signs in last 24 hours:  Temp:  [98.9 F (37.2 C)-99.3 F (37.4 C)] 98.9 F (37.2 C) (06/30 0400) Pulse Rate:  [73-98] 92 (06/30 0800) Resp:  [13-25] 20 (06/30 0800) BP: (101-137)/(78-117) 137/91 (06/30 0800) SpO2:  [92 %-100 %] 97 % (06/30 0800) FiO2 (%):  [40 %] 40 % (06/30 0753) Weight:  [168 kg] 168 kg (06/30 0500)  Weight change: 9 kg Filed Weights   10/11/21 0500 10/12/21 0500 10/13/21 0500  Weight: (!) 167 kg (!) 159 kg (!) 168 kg    Intake/Output: I/O last 3 completed shifts: In: 677.3 [I.V.:577; IV Piggyback:100.3] Out: 1960 [GGYIR:4854; Other:10]   Intake/Output this shift:  Total I/O In: 528.2 [I.V.:428.2; IV Piggyback:100] Out: 300 [Urine:300]  Physical Exam: General: ill-appearing, morbidly obese gentleman, BMI of 42  Head: Normocephalic, atraumatic.   Eyes: Anicteric  Lungs:  vent and trach placed 10/03/21  Heart: Regular rhythm  Abdomen:  Soft, nontender, obese  Extremities: No peripheral edema.  Neurologic: Alert, respond to simple questions   Skin: No lesions  Access: Right IJ temp cath    Basic Metabolic Panel: Recent Labs  Lab 10/09/21 0339 10/10/21 0509 10/11/21 0441 10/12/21 0614 10/13/21 0623  NA 141 139 141 139 142  K 4.2 3.5 3.5 3.4* 3.2*  CL 104 102 104 102 103  CO2 '25 27 26 26 28  '$ GLUCOSE 118* 131* 122* 114* 97  BUN 128* 96* 117* 81* 84*  CREATININE 4.87* 3.44* 3.53* 2.60* 2.71*  CALCIUM 8.6* 8.6* 9.2 9.2 9.2  MG 1.9 1.8 2.0 1.9 1.9  PHOS 6.1* 4.4 5.0* 3.9 4.4     Liver Function Tests: Recent Labs  Lab 10/09/21 0339 10/10/21 0509 10/11/21 0441 10/12/21 0614 10/13/21 0623  ALBUMIN 2.3* 2.5* 2.5* 2.6* 2.5*    No results for input(s): "LIPASE", "AMYLASE" in the last 168 hours. No results for input(s): "AMMONIA" in the last 168 hours.  CBC: Recent Labs  Lab 10/07/21 0548 10/07/21 1243 10/09/21 0339 10/09/21 1820 10/10/21 0509 10/10/21 1651 10/11/21 0441 10/12/21 0614 10/13/21 0623  WBC 8.6  --  6.5  --  7.8  --  7.7 7.3 6.4  NEUTROABS 6.4  --   --   --   --   --  5.9 5.3 3.9  HGB 8.2*   < > 7.8*   < > 8.3* 8.6* 8.6* 8.8* 9.0*  HCT 28.3*   < > 27.3*   < > 28.6* 30.2* 29.9* 30.8* 31.8*  MCV 77.1*  --  78.2*  --  79.4*  --  79.1* 78.0* 79.9*  PLT 187  --  227  --  250  --  263 273  294   < > = values in this interval not displayed.     Cardiac Enzymes: No results for input(s): "CKTOTAL", "CKMB", "CKMBINDEX", "TROPONINI" in the last 168 hours.  BNP: Invalid input(s): "POCBNP"  CBG: Recent Labs  Lab 10/12/21 1151 10/12/21 1617 10/12/21 1914 10/12/21 2316 10/13/21 0725  GLUCAP 112* 98 95 103* 88     Microbiology: Results for orders placed or performed during the hospital encounter of 09/19/21  SARS Coronavirus 2 by RT PCR (hospital order, performed in [facility] hospital lab) *cepheid single result test* Anterior Nasal Swab     Status: None   Collection Time: 09/19/21  9:57 AM   Specimen: Anterior Nasal Swab  Result Value Ref Range Status   SARS Coronavirus 2 by RT PCR  NEGATIVE NEGATIVE Final    Comment: (NOTE) SARS-CoV-2 target nucleic acids are NOT DETECTED.  The SARS-CoV-2 RNA is generally detectable in upper and lower respiratory specimens during the acute phase of infection. The lowest concentration of SARS-CoV-2 viral copies this assay can detect is 250 copies / mL. A negative result does not preclude SARS-CoV-2 infection and should not be used as the sole basis for treatment or other patient management decisions.  A negative result may occur with improper specimen collection / handling, submission of specimen other than nasopharyngeal swab, presence of viral mutation(s) within the areas targeted by this assay, and inadequate number of viral copies (<250 copies / mL). A negative result must be combined with clinical observations, patient history, and epidemiological information.  Fact Sheet for Patients:   https://www.patel.info/  Fact Sheet for Healthcare Providers: https://hall.com/  This test is not yet approved or  cleared by the [place] FDA and has been authorized for detection and/or diagnosis of SARS-CoV-2 by FDA under an Emergency Use Authorization (EUA).  This EUA will remain in effect (meaning this test can be used) for the duration of the COVID-19 declaration under Section 564(b)(1) of the Act, 21 U.S.C. section 360bbb-3(b)(1), unless the authorization is terminated or revoked sooner.  Performed at [facility], [address] [place], [place] [address]   Culture, Respiratory w Gram Stain     Status: None   Collection Time: 09/19/21 11:55 AM   Specimen: SPU  Result Value Ref Range Status   Specimen Description   Final    SPUTUM Performed at [facility], [place] [address], [place], [place] [address]    Special Requests   Final    NONE Performed at [facility], [place], [place] [address]    Gram Stain   Final    FEW WBC PRESENT,  PREDOMINANTLY PMN FEW GRAM POSITIVE COCCI IN PAIRS Performed at [facility], [place] [address], [place], [place] [address]    Culture RARE [name]  Final   Report Status 09/24/2021 FINAL  Final   Organism ID, Bacteria [name]  Final      Susceptibility   [name] - MIC*    CEFTAZIDIME 4 SENSITIVE Sensitive     CIPROFLOXACIN <=0.25 SENSITIVE Sensitive     GENTAMICIN 2 SENSITIVE Sensitive     IMIPENEM 1 SENSITIVE Sensitive     PIP/TAZO 8 SENSITIVE Sensitive     CEFEPIME 2 SENSITIVE Sensitive     * RARE [name]  MRSA Next Gen by PCR, Nasal     Status: None   Collection Time: 09/19/21  6:10 PM   Specimen: Nasal Mucosa; Nasal Swab  Result Value Ref Range  Status   MRSA by PCR Next Gen NOT DETECTED NOT DETECTED Final    Comment: (NOTE) The GeneXpert MRSA Assay (FDA approved for NASAL specimens only), is one component of a comprehensive MRSA colonization surveillance program. It is not intended to diagnose MRSA infection nor to guide or monitor treatment for MRSA infections. Test performance is not FDA approved in patients less than 75 years old. Performed at [facility], [address] [place], [place] [address]   Gastrointestinal Panel by PCR , Stool     Status: Abnormal   Collection Time: 09/21/21 10:53 AM   Specimen: Stool  Result Value Ref Range Status   Campylobacter species NOT DETECTED NOT DETECTED Final   Plesimonas shigelloides NOT DETECTED NOT DETECTED Final   Salmonella species NOT DETECTED NOT DETECTED Final   Yersinia enterocolitica DETECTED (A) NOT DETECTED Final    Comment: RESULT CALLED TO, READ BACK BY AND VERIFIED WITH: [facility] JACKSON AT 1126 09/22/21.PMF    Vibrio species NOT DETECTED NOT DETECTED Final   Vibrio cholerae NOT DETECTED NOT DETECTED Final   Enteroaggregative E coli (EAEC) NOT DETECTED NOT DETECTED Final   Enteropathogenic E coli (EPEC) NOT DETECTED NOT DETECTED Final    Enterotoxigenic E coli (ETEC) NOT DETECTED NOT DETECTED Final   Shiga like toxin producing E coli (STEC) NOT DETECTED NOT DETECTED Final   Shigella/Enteroinvasive E coli (EIEC) NOT DETECTED NOT DETECTED Final   Cryptosporidium NOT DETECTED NOT DETECTED Final   Cyclospora cayetanensis NOT DETECTED NOT DETECTED Final   Entamoeba histolytica NOT DETECTED NOT DETECTED Final   Giardia lamblia NOT DETECTED NOT DETECTED Final   Adenovirus F40/41 NOT DETECTED NOT DETECTED Final   Astrovirus NOT DETECTED NOT DETECTED Final   Norovirus GI/GII NOT DETECTED NOT DETECTED Final   Rotavirus A NOT DETECTED NOT DETECTED Final   Sapovirus (I, II, IV, and V) NOT DETECTED NOT DETECTED Final    Comment: Performed at [facility], [address] [place], [place] [address]  C Difficile Quick Screen w PCR reflex     Status: None   Collection Time: 09/21/21 11:30 AM   Specimen: STOOL  Result Value Ref Range Status   C Diff antigen NEGATIVE NEGATIVE Final   C Diff toxin NEGATIVE NEGATIVE Final   C Diff interpretation No C. difficile detected.  Final    Comment: Performed at [facility], [address] [place], [place] [address]  Culture, blood (Routine X 2) w Reflex to ID Panel     Status: None   Collection Time: 09/21/21 12:28 PM   Specimen: BLOOD  Result Value Ref Range Status   Specimen Description BLOOD BLOOD RIGHT HAND  Final   Special Requests   Final    BOTTLES DRAWN AEROBIC AND ANAEROBIC Blood Culture adequate volume   Culture   Final    NO GROWTH 5 DAYS Performed at [facility], [address], [place], [place] [address]    Report Status 09/26/2021 FINAL  Final  Culture, blood (Routine X 2) w Reflex to ID Panel     Status: None   Collection Time: 09/21/21  2:08 PM   Specimen: BLOOD  Result Value Ref Range Status   Specimen Description BLOOD BLOOD LEFT HAND  Final   Special Requests   Final    BOTTLES DRAWN AEROBIC AND ANAEROBIC Blood Culture adequate volume    Culture   Final    NO GROWTH 5 DAYS Performed at [facility], [place] [address],  [place], [place] [address]    Report Status 09/26/2021 FINAL  Final  Respiratory (~20 pathogens) panel by PCR     Status: None   Collection Time: 09/21/21  6:14 PM   Specimen: Nasopharyngeal Swab; Respiratory  Result Value Ref Range Status   Adenovirus NOT DETECTED NOT DETECTED Final   Coronavirus 229E NOT DETECTED NOT DETECTED Final    Comment: (NOTE) The Coronavirus on the Respiratory Panel, DOES NOT test for the novel  Coronavirus (2019 nCoV)    Coronavirus HKU1 NOT DETECTED NOT DETECTED Final   Coronavirus NL63 NOT DETECTED NOT DETECTED Final   Coronavirus OC43 NOT DETECTED NOT DETECTED Final   Metapneumovirus NOT DETECTED NOT DETECTED Final   Rhinovirus / Enterovirus NOT DETECTED NOT DETECTED Final   Influenza A NOT DETECTED NOT DETECTED Final   Influenza B NOT DETECTED NOT DETECTED Final   Parainfluenza Virus 1 NOT DETECTED NOT DETECTED Final   Parainfluenza Virus 2 NOT DETECTED NOT DETECTED Final   Parainfluenza Virus 3 NOT DETECTED NOT DETECTED Final   Parainfluenza Virus 4 NOT DETECTED NOT DETECTED Final   Respiratory Syncytial Virus NOT DETECTED NOT DETECTED Final   Bordetella pertussis NOT DETECTED NOT DETECTED Final   Bordetella Parapertussis NOT DETECTED NOT DETECTED Final   Chlamydophila pneumoniae NOT DETECTED NOT DETECTED Final   Mycoplasma pneumoniae NOT DETECTED NOT DETECTED Final    Comment: Performed at [facility], [address], [place], [place] [address]  Culture, Respiratory w Gram Stain     Status: None   Collection Time: 10/06/21 10:59 AM   Specimen: Tracheal Aspirate; Respiratory  Result Value Ref Range Status   Specimen Description   Final    TRACHEAL ASPIRATE Performed at [facility], [address], [place], [place] [address]    Special Requests   Final    NONE Performed at [facility], [address] [place],   [address]    Gram Stain   Final    ABUNDANT WBC PRESENT, PREDOMINANTLY PMN ABUNDANT GRAM NEGATIVE RODS Performed at [facility], [place] [address], [place],  [address]    Culture ABUNDANT [name]  Final   Report Status 10/09/2021 FINAL  Final   Organism ID, Bacteria [name]  Final      Susceptibility   [name] - MIC*    CEFTAZIDIME 8 SENSITIVE Sensitive     CIPROFLOXACIN <=0.25 SENSITIVE Sensitive     GENTAMICIN 2 SENSITIVE Sensitive     IMIPENEM 1 SENSITIVE Sensitive     * ABUNDANT [name]    Coagulation Studies: No results for input(s): "LABPROT", "INR" in the last 72 hours.  Urinalysis: No results for input(s): "COLORURINE", "LABSPEC", "PHURINE", "GLUCOSEU", "HGBUR", "BILIRUBINUR", "KETONESUR", "PROTEINUR", "UROBILINOGEN", "NITRITE", "LEUKOCYTESUR" in the last 72 hours.  Invalid input(s): "APPERANCEUR"    Imaging: DG Chest Port 1 View  Result Date: 10/13/2021 CLINICAL DATA:  COPD exacerbation. EXAM: PORTABLE CHEST 1 VIEW COMPARISON:  Chest radiograph dated October 08, 2021 FINDINGS: The heart is again markedly enlarged. Prominence of the central pulmonary vessels. Left midlung atelectasis or infiltrate. Small bilateral pleural effusions. Low lung volumes. Tracheostomy tube in satisfactory position. Right IJ access central line with distal tip in the SVC. IMPRESSION: 1. Stable marked cardiomegaly with pulmonary vascular congestion. 2. Low lung volumes with left mid lung subsegmental atelectasis. Small bilateral pleural effusions. Electronically Signed   By: Keane Police D.O.   On: 10/13/2021 10:51   DG Abd 1 View  Result Date:  10/12/2021 CLINICAL DATA:  Encounter for NGT placement. EXAM: ABDOMEN - 1 VIEW COMPARISON:  Portable study yesterday at 3:52 p.m. FINDINGS: NGT is adequately intragastric with the side-hole several cm below the hiatus and tip directed cephalad along the medial crest of the hemidiaphragm. The  bowel pattern is not well seen due to habitus. There are streaky opacities in the base of the lungs which could be atelectasis or infiltrates. The heart is enlarged. Small bowel dilatation in the upper to mid abdomen appears similar. No interval improvement or worsening. IMPRESSION: 1. Enteric tube appears adequately within the stomach. 2. Dilated air-filled small bowel in the upper abdomen is not significantly changed. 3. Streaky bibasilar lung opacities. Electronically Signed   By: Telford Nab M.D.   On: 10/12/2021 01:35   DG Abd 1 View  Result Date: 10/11/2021 CLINICAL DATA:  Enteric tube advancement. EXAM: ABDOMEN - 1 VIEW COMPARISON:  Abdominal x-ray from same day at 0652 hours. FINDINGS: Interval advancement of the enteric tube with the side port now in the stomach and the tip at the fundus. Multiple dilated loops of air-filled small bowel in the upper abdomen are unchanged. IMPRESSION: 1. Enteric tube now in the stomach. 2. Multiple dilated loops of air-filled small bowel in the upper abdomen are unchanged. Correlate for ileus. Electronically Signed   By: Titus Dubin M.D.   On: 10/11/2021 16:13     Medications:    sodium chloride Stopped (10/07/21 0542)   sodium chloride      ceFAZolin (ANCEF) IV     cefTAZidime (FORTAZ)  IV 2 g (10/13/21 0912)   potassium chloride 10 mEq (10/13/21 1040)    atorvastatin  10 mg Per Tube Daily   budesonide (PULMICORT) nebulizer solution  0.5 mg Nebulization BID   Chlorhexidine Gluconate Cloth  6 each Topical Daily   cholecalciferol  2,000 Units Per Tube Daily   epoetin (EPOGEN/PROCRIT) injection  10,000 Units Intravenous Q M,W,F-HD   heparin sodium (porcine)  2,000 Units Intravenous Q M,W,F-HD   ipratropium-albuterol  3 mL Nebulization BID   multivitamin  1 tablet Per Tube QHS   mouth rinse  15 mL Mouth Rinse Q2H   oxyCODONE  10 mg Per Tube Q8H   pantoprazole  40 mg Intravenous Q12H   polyethylene glycol  17 g Per Tube BID   acetaminophen,  acetaminophen, albuterol, alteplase, bisacodyl, clonazepam, famotidine, glycopyrrolate, heparin, HYDROmorphone (DILAUDID) injection, methylPREDNISolone (SOLU-MEDROL) injection, midazolam, midazolam, mouth rinse, pentafluoroprop-tetrafluoroeth  Assessment/ Plan:  Mr. ADDAM GOELLER is a 66 y.o.  male with past medical conditions including COPD requiring 2 L nasal cannula, and congestive heart failure.  Patient presents to the emergency department with shortness of breath.  Patient has been admitted for COPD exacerbation ([place]) [J44.1] Obesity hypoventilation syndrome ([place]) [E66.2] Acute on chronic diastolic congestive heart failure ([name]) [I50.33] Acute respiratory failure with hypoxia and hypercapnia ([name]) [J96.01, J96.02] Acute on chronic respiratory failure with hypoxia and hypercapnia ([name]) [K87.68, J96.22]    Acute Kidney Injury with hyperkalemia, cause unidentified.  No IV contrast exposure and brief hypotensive event. Normal renal function 2 days prior.Renal Ultrasound shows mild right obstruction , simple cyst in  left kidney and bladder distendion.   - Patient continues to have good urine output. Will hold dialysis through the weekend and evaluate renal recovery.  - Expected to receive contrast with PEG placement, will monitor renal effects.   Lab Results  Component Value Date   CREATININE 2.71 (H) 10/13/2021   CREATININE 2.60 (H) 10/12/2021  CREATININE 3.53 (H) 10/11/2021    Intake/Output Summary (Last 24 hours) at 10/13/2021 1105 Last data filed at 10/13/2021 0912 Gross per 24 hour  Intake 722.28 ml  Output 1550 ml  Net -827.72 ml    Acute respiratory failure, Pseudomonas noted in sputum on September 19, 2021. IV antibiotics completed Remains trached on vent at 40% FiO2   Chronic diastolic heart failure.  Echo from 08/07/2021 shows EF 60 to 65% with a mildly dilated left ventricular cavity and grade 1 diastolic dysfunction. Home regimen of aspirin, carvedilol, Farxiga, simvastatin,  torsemide All held  Anemia of critical illness, Hemoglobin 9.0, at target. Continue EPO 10000 units IV with dialysis.     LOS: 24 [place] 6/30/202311:05 AM

## 2021-10-13 NOTE — H&P (Signed)
HPI:  The patient has had a H&P performed within the last 30 days, all history, medications, and exam have been reviewed. Chart review does not indicate any interval changes since the H&P.  Medications: Prior to Admission medications   Medication Sig Start Date End Date Taking? Authorizing Provider  ADVAIR HFA 230-21 MCG/ACT inhaler Inhale 2 puffs into the lungs 2 (two) times daily. 09/05/21   [provider]  aspirin EC 81 MG tablet Take 81 mg by mouth daily. Swallow whole.    [provider]  budesonide (PULMICORT) 0.25 MG/2ML nebulizer solution Take 2 mLs (0.25 mg total) by nebulization 2 (two) times daily. 04/11/21   Sidney Ace, MD  calcitRIOL (ROCALTROL) 0.25 MCG capsule Take 0.25 mcg by mouth daily. 09/13/21   [provider]  carvedilol (COREG) 3.125 MG tablet Take 1 tablet (3.125 mg total) by mouth 2 (two) times daily with a meal. Skip dose if systolic BP less than 878 mmHg and/or heart rate less than 65 08/15/21 09/14/21  Nolberto Hanlon, MD  COMBIVENT RESPIMAT 20-100 MCG/ACT AERS respimat Inhale 2 puffs into the lungs in the morning and at bedtime. 05/19/21   [provider]  dapagliflozin propanediol (FARXIGA) 10 MG TABS tablet Take 1 tablet (10 mg total) by mouth daily before breakfast. 06/07/21   Alisa Graff, FNP  ferrous sulfate 325 (65 FE) MG tablet Take 1 tablet (325 mg total) by mouth 2 (two) times daily with a meal. 03/21/21   Nicole Kindred A, DO  ipratropium-albuterol (DUONEB) 0.5-2.5 (3) MG/3ML SOLN Take 3 mLs by nebulization every 6 (six) hours. Patient taking differently: Take 3 mLs by nebulization every 6 (six) hours as needed. 02/22/21   Flora Lipps, MD  pantoprazole (PROTONIX) 40 MG tablet Take 1 tablet (40 mg total) by mouth daily. 03/22/21   Nicole Kindred A, DO  potassium chloride (KLOR-CON M) 10 MEQ tablet Take 1 tablet (10 mEq total) by mouth daily. 07/05/21   Alisa Graff, FNP  simvastatin (ZOCOR) 10 MG tablet Take 1 tablet  (10 mg total) by mouth daily at 6 PM. 03/21/21   Ezekiel Slocumb, DO  torsemide (DEMADEX) 20 MG tablet Take 1 tablet (20 mg total) by mouth daily. 08/15/21 09/14/21  Nolberto Hanlon, MD  Vitamin D, Ergocalciferol, (DRISDOL) 1.25 MG (50000 UNIT) CAPS capsule Take 1 capsule (50,000 Units total) by mouth every 7 (seven) days. 08/15/21 11/13/21  Nolberto Hanlon, MD     Vital Signs: BP (!) 137/91   Pulse 92   Temp 98.9 F (37.2 C) (Axillary)   Resp 20   Ht '6\' 3"'$  (1.905 m)   Wt (!) 370 lb 6 oz (168 kg)   SpO2 97%   BMI 46.29 kg/m   Physical Exam Constitutional:      Appearance: He is ill-appearing.     Comments: Pt awake during exam but unable to communicate  Cardiovascular:     Rate and Rhythm: Normal rate and regular rhythm.  Pulmonary:     Breath sounds: Rhonchi present.     Comments: On ventilator via tracheostomy Abdominal:     General: There is no distension.     Palpations: Abdomen is soft.     Mallampati Score:  MD Evaluation Airway:  (Tracheostomy in place) Heart: WNL Abdomen: WNL Chest/ Lungs: WNL ASA  Classification: 3 Mallampati/Airway Score:  (tracheostomy in place)  Labs:  CBC: Recent Labs    10/10/21 0509 10/10/21 1651 10/11/21 0441 10/12/21 0614 10/13/21 0623  WBC 7.8  --  7.7 7.3 6.4  HGB 8.3* 8.6* 8.6* 8.8* 9.0*  HCT 28.6* 30.2* 29.9* 30.8* 31.8*  PLT 250  --  263 273 294    COAGS: Recent Labs    07/20/21 0916 09/27/21 1016 10/03/21 1250  INR 1.0 1.2 1.2  APTT  --  38*  --     BMP: Recent Labs    10/10/21 0509 10/11/21 0441 10/12/21 0614 10/13/21 0623  NA 139 141 139 142  K 3.5 3.5 3.4* 3.2*  CL 102 104 102 103  CO2 '27 26 26 28  '$ GLUCOSE 131* 122* 114* 97  BUN 96* 117* 81* 84*  CALCIUM 8.6* 9.2 9.2 9.2  CREATININE 3.44* 3.53* 2.60* 2.71*  GFRNONAA 19* 19* 27* 25*    LIVER FUNCTION TESTS: Recent Labs    07/20/21 0916 08/07/21 0824 09/19/21 0957 10/02/21 0349 10/06/21 0343 10/10/21 0509 10/11/21 0441 10/12/21 0614  10/13/21 0623  BILITOT 0.4 0.4 0.4 0.5  --   --   --   --   --   AST 13* 12* 10* 13*  --   --   --   --   --   ALT '10 10 10 18  '$ --   --   --   --   --   ALKPHOS 119 95 100 64  --   --   --   --   --   PROT 8.0 8.4* 8.1 7.1  --   --   --   --   --   ALBUMIN 3.3* 3.3* 3.1* 2.3*   < > 2.5* 2.5* 2.6* 2.5*   < > = values in this interval not displayed.    Assessment/Plan:  Gary [name] is a 66yo male with PMH per chart review significant for CHF, COPD, hx of AKI, gastric ulcers, and OSA with hx of being non-compliant with CPAP. Pt presented to ED on 6/6 with acute on chronic respiratory distress and he has been admitted to the ICU. While admitted, patient has been treated for decompensated CHF, pneumonia, and acute on chronic kidney disease now on dialysis. Patient is on a ventilator and has tracheostomy in place. Pt previously evaluated for and consented for percutaneous gastrostomy tube placement. Patient seen today for evaluation for image-guided tunneled hemodialysis catheter placement to be performed concurrently with percutaneous gastrostomy tube placement.   Risks and benefits discussed with the patient's sister Gary [name] over the phone with IR Nurse Practitioner Narda Rutherford present, including, but not limited to bleeding, infection, vascular injury, pneumothorax which may require chest tube placement, air embolism or even death  All of the patient's sister's questions were answered, patient's sister is agreeable to proceed. Consent signed and in IR PA room.   Signed: Lura Em PA-C 10/13/2021, 10:40 AM

## 2021-10-14 DIAGNOSIS — J9621 Acute and chronic respiratory failure with hypoxia: Secondary | ICD-10-CM | POA: Diagnosis not present

## 2021-10-14 DIAGNOSIS — G9341 Metabolic encephalopathy: Secondary | ICD-10-CM | POA: Diagnosis not present

## 2021-10-14 DIAGNOSIS — I5033 Acute on chronic diastolic (congestive) heart failure: Secondary | ICD-10-CM | POA: Diagnosis not present

## 2021-10-14 DIAGNOSIS — N1831 Chronic kidney disease, stage 3a: Secondary | ICD-10-CM | POA: Diagnosis not present

## 2021-10-14 LAB — CBC WITH DIFFERENTIAL/PLATELET
Abs Immature Granulocytes: 0.02 10*3/uL (ref 0.00–0.07)
Basophils Absolute: 0 10*3/uL (ref 0.0–0.1)
Basophils Relative: 1 %
Eosinophils Absolute: 0.1 10*3/uL (ref 0.0–0.5)
Eosinophils Relative: 2 %
HCT: 30.7 % — ABNORMAL LOW (ref 39.0–52.0)
Hemoglobin: 8.7 g/dL — ABNORMAL LOW (ref 13.0–17.0)
Immature Granulocytes: 0 %
Lymphocytes Relative: 15 %
Lymphs Abs: 0.9 10*3/uL (ref 0.7–4.0)
MCH: 22.3 pg — ABNORMAL LOW (ref 26.0–34.0)
MCHC: 28.3 g/dL — ABNORMAL LOW (ref 30.0–36.0)
MCV: 78.7 fL — ABNORMAL LOW (ref 80.0–100.0)
Monocytes Absolute: 0.8 10*3/uL (ref 0.1–1.0)
Monocytes Relative: 14 %
Neutro Abs: 4.2 10*3/uL (ref 1.7–7.7)
Neutrophils Relative %: 68 %
Platelets: 275 10*3/uL (ref 150–400)
RBC: 3.9 MIL/uL — ABNORMAL LOW (ref 4.22–5.81)
RDW: 16.8 % — ABNORMAL HIGH (ref 11.5–15.5)
WBC: 6.2 10*3/uL (ref 4.0–10.5)
nRBC: 0 % (ref 0.0–0.2)

## 2021-10-14 LAB — RENAL FUNCTION PANEL
Albumin: 2.5 g/dL — ABNORMAL LOW (ref 3.5–5.0)
Anion gap: 8 (ref 5–15)
BUN: 86 mg/dL — ABNORMAL HIGH (ref 8–23)
CO2: 27 mmol/L (ref 22–32)
Calcium: 8.8 mg/dL — ABNORMAL LOW (ref 8.9–10.3)
Chloride: 109 mmol/L (ref 98–111)
Creatinine, Ser: 2.77 mg/dL — ABNORMAL HIGH (ref 0.61–1.24)
GFR, Estimated: 25 mL/min — ABNORMAL LOW (ref >60)
Glucose, Bld: 90 mg/dL (ref 70–99)
Phosphorus: 4.2 mg/dL (ref 2.5–4.6)
Potassium: 3.7 mmol/L (ref 3.5–5.1)
Sodium: 144 mmol/L (ref 135–145)

## 2021-10-14 LAB — MAGNESIUM: Magnesium: 1.9 mg/dL (ref 1.7–2.4)

## 2021-10-14 LAB — GLUCOSE, CAPILLARY
Glucose-Capillary: 77 mg/dL (ref 70–99)
Glucose-Capillary: 77 mg/dL (ref 70–99)
Glucose-Capillary: 85 mg/dL (ref 70–99)
Glucose-Capillary: 88 mg/dL (ref 70–99)
Glucose-Capillary: 92 mg/dL (ref 70–99)
Glucose-Capillary: 98 mg/dL (ref 70–99)
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
## 2021-10-14 NOTE — Progress Notes (Signed)
[facility] for Electrolyte Monitoring and Replacement   Recent Labs: Potassium (mmol/L)  Date Value  10/14/2021 3.7  04/15/2014 3.9   Magnesium (mg/dL)  Date Value  10/14/2021 1.9  08/21/2012 1.9   Calcium (mg/dL)  Date Value  10/14/2021 8.8 (L)   Calcium, Total (mg/dL)  Date Value  04/15/2014 8.3 (L)   Albumin (g/dL)  Date Value  10/14/2021 2.5 (L)  04/15/2014 2.9 (L)   Phosphorus (mg/dL)  Date Value  10/14/2021 4.2   Sodium (mmol/L)  Date Value  10/14/2021 144  03/15/2020 145 (H)  04/15/2014 140    Assessment: 66 y/o male with h/o PUD, OSA, HTN, CKD III, substance abuse, CHF, Hep C and COPD and COVID 19 (02/2021) who is admitted with COPD exacerbation, Pseudomonas pneumonia on ceftazidime, HFpEF, and AKI. Intubated and on fentanyl. Pharmacy is asked to follow and replace electrolytes while in CCU  Goal of Therapy:  Electrolytes within normal limits  Plan:  --no electrolyte replacement warranted for today --Follow-up electrolytes with AM labs tomorrow  Dallie Piles 10/14/2021 5:59 AM

## 2021-10-14 NOTE — Progress Notes (Signed)
Placed on 40% Trach Collar, cuff deflated, sats 94%, respiratory rate 18/min. Tolerating well at this time.

## 2021-10-14 NOTE — Progress Notes (Signed)
[facility]  ROUNDING NOTE   Subjective:   Gary [name] is a 66 year old male with past medical conditions including COPD requiring 2 L nasal cannula, and congestive heart failure.  Patient presents to the emergency department with shortness of breath.  Patient has been admitted for COPD exacerbation ([place]) [J44.1] Obesity hypoventilation syndrome ([place]) [E66.2] Acute on chronic diastolic congestive heart failure ([name]) [I50.33] Acute respiratory failure with hypoxia and hypercapnia ([name]) [J96.01, J96.02] Acute on chronic respiratory failure with hypoxia and hypercapnia ([name]) [K91.79, J96.22]  Patient is known and has recently been followed by Dr. Holley Raring for acute kidney injury, resolved at his May appointment.    Update PEG tube has been placed. Patient had excellent urine output of 1.8 L over the preceding 24 hours. Patient resting comfortably.  Objective:  Vital signs in last 24 hours:  Temp:  [98.2 F (36.8 C)-99 F (37.2 C)] 98.2 F (36.8 C) (07/01 0800) Pulse Rate:  [41-96] 90 (07/01 1000) Resp:  [12-25] 21 (07/01 1000) BP: (102-147)/(75-119) 132/84 (07/01 1000) SpO2:  [87 %-100 %] 96 % (07/01 1000) FiO2 (%):  [40 %-100 %] 40 % (07/01 0829) Weight:  [180 kg] 180 kg (07/01 0800)  Weight change:  Filed Weights   10/12/21 0500 10/13/21 0500 10/14/21 0800  Weight: (!) 159 kg (!) 168 kg (!) 180 kg    Intake/Output: I/O last 3 completed shifts: In: 1490.3 [I.V.:855; Other:100; NG/GT:60; IV Piggyback:475.4] Out: 2900 [Urine:2650; Drains:250]   Intake/Output this shift:  Total I/O In: 69.7 [I.V.:59.7; Other:10] Out: -   Physical Exam: General: Chronically ill-appearing  Head: Normocephalic, atraumatic.   Eyes: Anicteric  Lungs:  Scattered rhonchi, vent assisted  Heart: Regular rhythm  Abdomen:  Soft, nontender, obese, PEG tube in place  Extremities: No peripheral edema.  Neurologic: Alert, respond to simple questions  Skin: No lesions  Access: Right  IJ temp cath    Basic Metabolic Panel: Recent Labs  Lab 10/10/21 0509 10/11/21 0441 10/12/21 0614 10/13/21 0623 10/14/21 0400  NA 139 141 139 142 144  K 3.5 3.5 3.4* 3.2* 3.7  CL 102 104 102 103 109  CO2 '27 26 26 28 27  '$ GLUCOSE 131* 122* 114* 97 90  BUN 96* 117* 81* 84* 86*  CREATININE 3.44* 3.53* 2.60* 2.71* 2.77*  CALCIUM 8.6* 9.2 9.2 9.2 8.8*  MG 1.8 2.0 1.9 1.9 1.9  PHOS 4.4 5.0* 3.9 4.4 4.2     Liver Function Tests: Recent Labs  Lab 10/10/21 0509 10/11/21 0441 10/12/21 0614 10/13/21 0623 10/14/21 0400  ALBUMIN 2.5* 2.5* 2.6* 2.5* 2.5*    No results for input(s): "LIPASE", "AMYLASE" in the last 168 hours. No results for input(s): "AMMONIA" in the last 168 hours.  CBC: Recent Labs  Lab 10/10/21 0509 10/10/21 1651 10/11/21 0441 10/12/21 0614 10/13/21 0623 10/14/21 0400  WBC 7.8  --  7.7 7.3 6.4 6.2  NEUTROABS  --   --  5.9 5.3 3.9 4.2  HGB 8.3* 8.6* 8.6* 8.8* 9.0* 8.7*  HCT 28.6* 30.2* 29.9* 30.8* 31.8* 30.7*  MCV 79.4*  --  79.1* 78.0* 79.9* 78.7*  PLT 250  --  263 273 294 275     Cardiac Enzymes: No results for input(s): "CKTOTAL", "CKMB", "CKMBINDEX", "TROPONINI" in the last 168 hours.  BNP: Invalid input(s): "POCBNP"  CBG: Recent Labs  Lab 10/13/21 1935 10/13/21 2101 10/13/21 2345 10/14/21 0335 10/14/21 0740  GLUCAP 70 76 78 85 92     Microbiology: Results for orders placed or performed  during the hospital encounter of 09/19/21  SARS Coronavirus 2 by RT PCR (hospital order, performed in [facility] hospital lab) *cepheid single result test* Anterior Nasal Swab     Status: None   Collection Time: 09/19/21  9:57 AM   Specimen: Anterior Nasal Swab  Result Value Ref Range Status   SARS Coronavirus 2 by RT PCR NEGATIVE NEGATIVE Final    Comment: (NOTE) SARS-CoV-2 target nucleic acids are NOT DETECTED.  The SARS-CoV-2 RNA is generally detectable in upper and lower respiratory specimens during the acute phase of infection. The  lowest concentration of SARS-CoV-2 viral copies this assay can detect is 250 copies / mL. A negative result does not preclude SARS-CoV-2 infection and should not be used as the sole basis for treatment or other patient management decisions.  A negative result may occur with improper specimen collection / handling, submission of specimen other than nasopharyngeal swab, presence of viral mutation(s) within the areas targeted by this assay, and inadequate number of viral copies (<250 copies / mL). A negative result must be combined with clinical observations, patient history, and epidemiological information.  Fact Sheet for Patients:   https://www.patel.info/  Fact Sheet for Healthcare Providers: https://hall.com/  This test is not yet approved or  cleared by the [place] FDA and has been authorized for detection and/or diagnosis of SARS-CoV-2 by FDA under an Emergency Use Authorization (EUA).  This EUA will remain in effect (meaning this test can be used) for the duration of the COVID-19 declaration under Section 564(b)(1) of the Act, 21 U.S.C. section 360bbb-3(b)(1), unless the authorization is terminated or revoked sooner.  Performed at [facility], [address] [place], [place] [address]   Culture, Respiratory w Gram Stain     Status: None   Collection Time: 09/19/21 11:55 AM   Specimen: SPU  Result Value Ref Range Status   Specimen Description   Final    SPUTUM Performed at [facility], [place] [address], [place], [place] [address]    Special Requests   Final    NONE Performed at [facility], [place], [place] [address]    Gram Stain   Final    FEW WBC PRESENT, PREDOMINANTLY PMN FEW GRAM POSITIVE COCCI IN PAIRS Performed at [facility], [place] [address], [place], [place] [address]    Culture RARE [name]  Final   Report Status 09/24/2021 FINAL  Final    Organism ID, Bacteria [name]  Final      Susceptibility   [name] - MIC*    CEFTAZIDIME 4 SENSITIVE Sensitive     CIPROFLOXACIN <=0.25 SENSITIVE Sensitive     GENTAMICIN 2 SENSITIVE Sensitive     IMIPENEM 1 SENSITIVE Sensitive     PIP/TAZO 8 SENSITIVE Sensitive     CEFEPIME 2 SENSITIVE Sensitive     * RARE [name]  MRSA Next Gen by PCR, Nasal     Status: None   Collection Time: 09/19/21  6:10 PM   Specimen: Nasal Mucosa; Nasal Swab  Result Value Ref Range Status   MRSA by PCR Next Gen NOT DETECTED NOT DETECTED Final    Comment: (NOTE) The GeneXpert MRSA Assay (FDA approved for NASAL specimens only), is one component of a comprehensive MRSA colonization surveillance program. It is not intended to diagnose MRSA infection nor to guide or monitor treatment for MRSA infections. Test performance is not FDA approved in patients less than 56 years old. Performed at  [facility], [address], [place], [place] [address]   Gastrointestinal Panel by PCR , Stool     Status: Abnormal   Collection Time: 09/21/21 10:53 AM   Specimen: Stool  Result Value Ref Range Status   Campylobacter species NOT DETECTED NOT DETECTED Final   Plesimonas shigelloides NOT DETECTED NOT DETECTED Final   Salmonella species NOT DETECTED NOT DETECTED Final   Yersinia enterocolitica DETECTED (A) NOT DETECTED Final    Comment: RESULT CALLED TO, READ BACK BY AND VERIFIED WITH: [facility] JACKSON AT 1126 09/22/21.PMF    Vibrio species NOT DETECTED NOT DETECTED Final   Vibrio cholerae NOT DETECTED NOT DETECTED Final   Enteroaggregative E coli (EAEC) NOT DETECTED NOT DETECTED Final   Enteropathogenic E coli (EPEC) NOT DETECTED NOT DETECTED Final   Enterotoxigenic E coli (ETEC) NOT DETECTED NOT DETECTED Final   Shiga like toxin producing E coli (STEC) NOT DETECTED NOT DETECTED Final   Shigella/Enteroinvasive E coli (EIEC) NOT DETECTED NOT DETECTED Final    Cryptosporidium NOT DETECTED NOT DETECTED Final   Cyclospora cayetanensis NOT DETECTED NOT DETECTED Final   Entamoeba histolytica NOT DETECTED NOT DETECTED Final   Giardia lamblia NOT DETECTED NOT DETECTED Final   Adenovirus F40/41 NOT DETECTED NOT DETECTED Final   Astrovirus NOT DETECTED NOT DETECTED Final   Norovirus GI/GII NOT DETECTED NOT DETECTED Final   Rotavirus A NOT DETECTED NOT DETECTED Final   Sapovirus (I, II, IV, and V) NOT DETECTED NOT DETECTED Final    Comment: Performed at [facility], [address] [place], [place] [address]  C Difficile Quick Screen w PCR reflex     Status: None   Collection Time: 09/21/21 11:30 AM   Specimen: STOOL  Result Value Ref Range Status   C Diff antigen NEGATIVE NEGATIVE Final   C Diff toxin NEGATIVE NEGATIVE Final   C Diff interpretation No C. difficile detected.  Final    Comment: Performed at [facility], [address] [place], [place] [address]  Culture, blood (Routine X 2) w Reflex to ID Panel     Status: None   Collection Time: 09/21/21 12:28 PM   Specimen: BLOOD  Result Value Ref Range Status   Specimen Description BLOOD BLOOD RIGHT HAND  Final   Special Requests   Final    BOTTLES DRAWN AEROBIC AND ANAEROBIC Blood Culture adequate volume   Culture   Final    NO GROWTH 5 DAYS Performed at [facility], [address] [place], [place] [address]    Report Status 09/26/2021 FINAL  Final  Culture, blood (Routine X 2) w Reflex to ID Panel     Status: None   Collection Time: 09/21/21  2:08 PM   Specimen: BLOOD  Result Value Ref Range Status   Specimen Description BLOOD BLOOD LEFT HAND  Final   Special Requests   Final    BOTTLES DRAWN AEROBIC AND ANAEROBIC Blood Culture adequate volume   Culture   Final    NO GROWTH 5 DAYS Performed at [facility], [address], [place], [place] [address]    Report Status 09/26/2021 FINAL  Final  Respiratory (~20 pathogens) panel by PCR      Status: None   Collection Time: 09/21/21  6:14 PM   Specimen: Nasopharyngeal Swab; Respiratory  Result Value Ref Range Status   Adenovirus NOT DETECTED NOT DETECTED Final   Coronavirus 229E NOT DETECTED NOT DETECTED Final    Comment: (NOTE) The Coronavirus on  the Respiratory Panel, DOES NOT test for the novel  Coronavirus (2019 nCoV)    Coronavirus HKU1 NOT DETECTED NOT DETECTED Final   Coronavirus NL63 NOT DETECTED NOT DETECTED Final   Coronavirus OC43 NOT DETECTED NOT DETECTED Final   Metapneumovirus NOT DETECTED NOT DETECTED Final   Rhinovirus / Enterovirus NOT DETECTED NOT DETECTED Final   Influenza A NOT DETECTED NOT DETECTED Final   Influenza B NOT DETECTED NOT DETECTED Final   Parainfluenza Virus 1 NOT DETECTED NOT DETECTED Final   Parainfluenza Virus 2 NOT DETECTED NOT DETECTED Final   Parainfluenza Virus 3 NOT DETECTED NOT DETECTED Final   Parainfluenza Virus 4 NOT DETECTED NOT DETECTED Final   Respiratory Syncytial Virus NOT DETECTED NOT DETECTED Final   Bordetella pertussis NOT DETECTED NOT DETECTED Final   Bordetella Parapertussis NOT DETECTED NOT DETECTED Final   Chlamydophila pneumoniae NOT DETECTED NOT DETECTED Final   Mycoplasma pneumoniae NOT DETECTED NOT DETECTED Final    Comment: Performed at [facility], [place] [address], [place], [place] [address]  Culture, Respiratory w Gram Stain     Status: None   Collection Time: 10/06/21 10:59 AM   Specimen: Tracheal Aspirate; Respiratory  Result Value Ref Range Status   Specimen Description   Final    TRACHEAL ASPIRATE Performed at [facility], [address] [place], [place] [address]    Special Requests   Final    NONE Performed at [facility], [address] [place], [place] [address]    Gram Stain   Final    ABUNDANT WBC PRESENT, PREDOMINANTLY PMN ABUNDANT GRAM NEGATIVE RODS Performed at [facility], [place] [address], [place], [place] [address]    Culture ABUNDANT PSEUDOMONAS  AERUGINOSA  Final   Report Status 10/09/2021 FINAL  Final   Organism ID, Bacteria [name]  Final      Susceptibility   [name] - MIC*    CEFTAZIDIME 8 SENSITIVE Sensitive     CIPROFLOXACIN <=0.25 SENSITIVE Sensitive     GENTAMICIN 2 SENSITIVE Sensitive     IMIPENEM 1 SENSITIVE Sensitive     * ABUNDANT [name]    Coagulation Studies: No results for input(s): "LABPROT", "INR" in the last 72 hours.  Urinalysis: No results for input(s): "COLORURINE", "LABSPEC", "PHURINE", "GLUCOSEU", "HGBUR", "BILIRUBINUR", "KETONESUR", "PROTEINUR", "UROBILINOGEN", "NITRITE", "LEUKOCYTESUR" in the last 72 hours.  Invalid input(s): "APPERANCEUR"    Imaging: IR GASTROSTOMY TUBE MOD SED  Result Date: 10/13/2021 INDICATION: Need for long-term enteric access EXAM: Placement of percutaneous gastrostomy tube using fluoroscopic guidance MEDICATIONS: Glucagon 1 mg IV ANESTHESIA/SEDATION: Moderate (conscious) sedation was employed during this procedure. A total of Versed 1 mg and Fentanyl 75 mcg was administered intravenously by the radiology nurse. Total intra-service moderate Sedation Time: 26 minutes. The patient's level of consciousness and vital signs were monitored continuously by radiology nursing throughout the procedure under my direct supervision. CONTRAST:  25 mL-administered into the gastric lumen. FLUOROSCOPY: Radiation Exposure Index (as provided by the fluoroscopic device): 4.4 minutes (88 mGy) COMPLICATIONS: None immediate. PROCEDURE: Informed written consent was obtained from the patient after a thorough discussion of the procedural risks, benefits and alternatives. All questions were addressed. Maximal Sterile Barrier Technique was utilized including caps, mask, sterile gowns, sterile gloves, sterile drape, hand hygiene and skin antiseptic. A timeout was performed prior to the initiation of the procedure. Review of pre-procedure CT scan demonstrates an adequate  window for percutaneous placement of a gastrostomy tube. The patient was placed  supine on the exam table. Using fluoroscopic guidance, an angled 5 French catheter was passed through the nares into the stomach. It was secured to the nose, and the stomach was insufflated with air. The abdomen was prepped and draped in the standard sterile fashion. After insufflating the stomach with air, puncture sites were selected and local analgesia was obtained with 1% lidocaine. Using fluoroscopic guidance, a gastropexy needle was advanced into the stomach and the T-bar suture was released. Entry into the stomach was confirmed with fluoroscopy, aspiration of air, and injection of contrast material. This was repeated with an additional gastropexy suture (for a total of 2 fasteners). Note that placement of these fasteners required multiple passes due to challenging body habitus. At the center of these gastropexy sutures, a dermatotomy was performed. An 18 gauge needle was then passed into the stomach, and position within the gastric lumen again confirmed under fluoroscopy using aspiration of air and injection of contrast material. An Amplatz guidewire was passed through this needle and intraluminal placement was confirmed with fluoroscopy. The needle was removed, and over the guidewire, a 20 French balloon gastrostomy tube with a coaxial 10 mm balloon was advanced into the percutaneous tract. Dilation of the percutaneous tract was then performed using the balloon, followed by advancement of the gastrostomy tube into the gastric lumen. The retention balloon was then inflated with 20 mL of sterile water, and the tube was brought back to the gastric wall. The wire and balloon were removed. The external bumper was brought to the skin. Location of the gastrostomy tube within the stomach was then confirmed with injection of contrast material, opacifying the gastric lumen. The gastrostomy tube was flushed with sterile water, and secured  to the skin using a dressing. The patient tolerated the procedure well without immediate complication, and was transferred to recovery in stable condition. IMPRESSION: Successful placement of a percutaneous 20 French balloon gastrostomy tube using fluoroscopic guidance. Refer to instructions in the EMR for use. Electronically Signed   By: Albin Felling M.D.   On: 10/13/2021 15:54   DG Chest Port 1 View  Result Date: 10/13/2021 CLINICAL DATA:  COPD exacerbation. EXAM: PORTABLE CHEST 1 VIEW COMPARISON:  Chest radiograph dated October 08, 2021 FINDINGS: The heart is again markedly enlarged. Prominence of the central pulmonary vessels. Left midlung atelectasis or infiltrate. Small bilateral pleural effusions. Low lung volumes. Tracheostomy tube in satisfactory position. Right IJ access central line with distal tip in the SVC. IMPRESSION: 1. Stable marked cardiomegaly with pulmonary vascular congestion. 2. Low lung volumes with left mid lung subsegmental atelectasis. Small bilateral pleural effusions. Electronically Signed   By: Keane Police D.O.   On: 10/13/2021 10:51     Medications:    sodium chloride Stopped (10/07/21 0542)   sodium chloride 10 mL/hr at 10/14/21 0900    ceFAZolin (ANCEF) IV     cefTAZidime (FORTAZ)  IV 2 g (10/14/21 1020)   dextrose 20 mL/hr at 10/14/21 0900    atorvastatin  10 mg Per Tube Daily   budesonide (PULMICORT) nebulizer solution  0.5 mg Nebulization BID   Chlorhexidine Gluconate Cloth  6 each Topical Daily   cholecalciferol  2,000 Units Per Tube Daily   epoetin (EPOGEN/PROCRIT) injection  10,000 Units Intravenous Q M,W,F-HD   heparin sodium (porcine)  2,000 Units Intravenous Q M,W,F-HD   ipratropium-albuterol  3 mL Nebulization BID   multivitamin  1 tablet Per Tube QHS   mouth rinse  15 mL Mouth Rinse Q2H   oxyCODONE  10 mg Per Tube Q8H   pantoprazole  40 mg Intravenous Q12H   polyethylene glycol  17 g Per Tube BID   acetaminophen, acetaminophen, albuterol,  alteplase, bisacodyl, clonazepam, famotidine, glycopyrrolate, heparin, HYDROmorphone (DILAUDID) injection, methylPREDNISolone (SOLU-MEDROL) injection, midazolam, midazolam, mouth rinse, pentafluoroprop-tetrafluoroeth  Assessment/ Plan:  Gary [name] is a 66 y.o.  male with past medical conditions including COPD requiring 2 L nasal cannula, and congestive heart failure.  Patient presents to the emergency department with shortness of breath.  Patient has been admitted for COPD exacerbation ([place]) [J44.1] Obesity hypoventilation syndrome ([place]) [E66.2] Acute on chronic diastolic congestive heart failure ([name]) [I50.33] Acute respiratory failure with hypoxia and hypercapnia ([name]) [J96.01, J96.02] Acute on chronic respiratory failure with hypoxia and hypercapnia ([name]) [H21.22, J96.22]    Acute Kidney Injury with hyperkalemia, cause unidentified.  No IV contrast exposure and brief hypotensive event. Normal renal function 2 days prior.Renal Ultrasound shows mild right obstruction , simple cyst in  left kidney and bladder distendion.   -Patient with good urine output.  1.8 L over the preceding 24 hours.  Hold dialysis over the weekend.  Reassess for need on Monday.  Lab Results  Component Value Date   CREATININE 2.77 (H) 10/14/2021   CREATININE 2.71 (H) 10/13/2021   CREATININE 2.60 (H) 10/12/2021    Intake/Output Summary (Last 24 hours) at 10/14/2021 1031 Last data filed at 10/14/2021 0900 Gross per 24 hour  Intake 1031.79 ml  Output 1750 ml  Net -718.21 ml    Acute respiratory failure, Pseudomonas noted in sputum on September 19, 2021. IV antibiotics completed Respiratory status appears to be stable.  Continue ventilatory support at this time.   Chronic diastolic heart failure.  Echo from 08/07/2021 shows EF 60 to 65% with a mildly dilated left ventricular cavity and grade 1 diastolic dysfunction. Volume status appears to be acceptable.  Anemia of critical illness, Hemoglobin 9.0, at target.  Continue EPO 10000 units IV with dialysis.     LOS: 25 Oluwaseun Bruyere 7/1/202310:31 AM

## 2021-10-14 NOTE — Plan of Care (Signed)
  Problem: Education: Goal: Ability to demonstrate management of disease process will improve Outcome: Progressing   Problem: Cardiac: Goal: Ability to achieve and maintain adequate cardiopulmonary perfusion will improve Outcome: Progressing   Problem: Activity: Goal: Ability to tolerate increased activity will improve Outcome: Progressing   Problem: Respiratory: Goal: Ability to maintain a clear airway will improve Outcome: Progressing Goal: Levels of oxygenation will improve Outcome: Progressing Goal: Ability to maintain adequate ventilation will improve Outcome: Progressing   Problem: Education: Goal: Knowledge of General Education information will improve Description: Including pain rating scale, medication(s)/side effects and non-pharmacologic comfort measures Outcome: Progressing   Problem: Health Behavior/Discharge Planning: Goal: Ability to manage health-related needs will improve Outcome: Progressing   Problem: Clinical Measurements: Goal: Ability to maintain clinical measurements within normal limits will improve Outcome: Progressing Goal: Will remain free from infection Outcome: Progressing Goal: Diagnostic test results will improve Outcome: Progressing Goal: Respiratory complications will improve Outcome: Progressing Goal: Cardiovascular complication will be avoided Outcome: Progressing   Problem: Activity: Goal: Risk for activity intolerance will decrease Outcome: Progressing   Problem: Coping: Goal: Level of anxiety will decrease Outcome: Progressing   Problem: Elimination: Goal: Will not experience complications related to bowel motility Outcome: Progressing Goal: Will not experience complications related to urinary retention Outcome: Progressing   Problem: Pain Managment: Goal: General experience of comfort will improve Outcome: Progressing   Problem: Safety: Goal: Ability to remain free from injury will improve Outcome: Progressing    Problem: Skin Integrity: Goal: Risk for impaired skin integrity will decrease Outcome: Progressing   Problem: Education: Goal: Ability to describe self-care measures that may prevent or decrease complications (Diabetes Survival Skills Education) will improve Outcome: Progressing   Problem: Coping: Goal: Ability to adjust to condition or change in health will improve Outcome: Progressing   Problem: Fluid Volume: Goal: Ability to maintain a balanced intake and output will improve Outcome: Progressing   Problem: Health Behavior/Discharge Planning: Goal: Ability to identify and utilize available resources and services will improve Outcome: Progressing Goal: Ability to manage health-related needs will improve Outcome: Progressing   Problem: Metabolic: Goal: Ability to maintain appropriate glucose levels will improve Outcome: Progressing   Problem: Skin Integrity: Goal: Risk for impaired skin integrity will decrease Outcome: Progressing   Problem: Tissue Perfusion: Goal: Adequacy of tissue perfusion will improve Outcome: Progressing

## 2021-10-15 LAB — RENAL FUNCTION PANEL
Albumin: 2.4 g/dL — ABNORMAL LOW (ref 3.5–5.0)
Anion gap: 9 (ref 5–15)
BUN: 73 mg/dL — ABNORMAL HIGH (ref 8–23)
CO2: 25 mmol/L (ref 22–32)
Calcium: 8.4 mg/dL — ABNORMAL LOW (ref 8.9–10.3)
Chloride: 109 mmol/L (ref 98–111)
Creatinine, Ser: 2.43 mg/dL — ABNORMAL HIGH (ref 0.61–1.24)
GFR, Estimated: 29 mL/min — ABNORMAL LOW (ref >60)
Glucose, Bld: 91 mg/dL (ref 70–99)
Phosphorus: 4.1 mg/dL (ref 2.5–4.6)
Potassium: 2.9 mmol/L — ABNORMAL LOW (ref 3.5–5.1)
Sodium: 143 mmol/L (ref 135–145)

## 2021-10-15 LAB — CBC WITH DIFFERENTIAL/PLATELET
Abs Immature Granulocytes: 0.02 10*3/uL (ref 0.00–0.07)
Basophils Absolute: 0 10*3/uL (ref 0.0–0.1)
Basophils Relative: 1 %
Eosinophils Absolute: 0.2 10*3/uL (ref 0.0–0.5)
Eosinophils Relative: 3 %
HCT: 31.2 % — ABNORMAL LOW (ref 39.0–52.0)
Hemoglobin: 8.7 g/dL — ABNORMAL LOW (ref 13.0–17.0)
Immature Granulocytes: 0 %
Lymphocytes Relative: 21 %
Lymphs Abs: 1.3 10*3/uL (ref 0.7–4.0)
MCH: 22.4 pg — ABNORMAL LOW (ref 26.0–34.0)
MCHC: 27.9 g/dL — ABNORMAL LOW (ref 30.0–36.0)
MCV: 80.4 fL (ref 80.0–100.0)
Monocytes Absolute: 0.7 10*3/uL (ref 0.1–1.0)
Monocytes Relative: 12 %
Neutro Abs: 3.8 10*3/uL (ref 1.7–7.7)
Neutrophils Relative %: 63 %
Platelets: 293 10*3/uL (ref 150–400)
RBC: 3.88 MIL/uL — ABNORMAL LOW (ref 4.22–5.81)
RDW: 17.2 % — ABNORMAL HIGH (ref 11.5–15.5)
WBC: 6 10*3/uL (ref 4.0–10.5)
nRBC: 0 % (ref 0.0–0.2)

## 2021-10-15 LAB — GLUCOSE, CAPILLARY
Glucose-Capillary: 89 mg/dL (ref 70–99)
Glucose-Capillary: 87 mg/dL (ref 70–99)
Glucose-Capillary: 84 mg/dL (ref 70–99)
Glucose-Capillary: 103 mg/dL — ABNORMAL HIGH (ref 70–99)
Glucose-Capillary: 118 mg/dL — ABNORMAL HIGH (ref 70–99)
Glucose-Capillary: 98 mg/dL (ref 70–99)

## 2021-10-15 LAB — POTASSIUM: Potassium: 3.2 mmol/L — ABNORMAL LOW (ref 3.5–5.1)

## 2021-10-15 LAB — MAGNESIUM: Magnesium: 1.8 mg/dL (ref 1.7–2.4)

## 2021-10-15 MED ORDER — NEPRO/CARBSTEADY PO LIQD
1000.0000 mL | ORAL | Status: DC
  Administered 2021-10-15 – 2021-10-22 (×7): 1000 mL

## 2021-10-15 MED ORDER — JUVEN PO PACK
1.0000 | PACK | Freq: Two times a day (BID) | ORAL | Status: DC
  Administered 2021-10-15 – 2021-11-01 (×19): 1

## 2021-10-15 MED ORDER — POTASSIUM CHLORIDE 20 MEQ PO PACK
20.0000 meq | PACK | Freq: Once | ORAL | Status: AC
Start: 2021-10-15 — End: 2021-10-15
  Administered 2021-10-15: 20 meq
  Filled 2021-10-15: qty 1

## 2021-10-15 MED ORDER — PROSOURCE TF PO LIQD
90.0000 mL | Freq: Three times a day (TID) | ORAL | Status: DC
  Administered 2021-10-15 – 2021-10-16 (×3): 90 mL
  Filled 2021-10-15 (×5): qty 90

## 2021-10-15 MED ORDER — POTASSIUM CHLORIDE 20 MEQ PO PACK
20.0000 meq | PACK | Freq: Once | ORAL | Status: AC
  Administered 2021-10-15: 20 meq
  Filled 2021-10-15: qty 1

## 2021-10-15 MED ORDER — POLYETHYLENE GLYCOL 3350 17 G PO PACK: 17.0000 g | PACK | Freq: Every day | ORAL | Status: DC | PRN

## 2021-10-15 NOTE — Progress Notes (Signed)
Pt taken off vent and placed on 40% Trach Collar. Tolerating well at this time.

## 2021-10-15 NOTE — Progress Notes (Signed)
Gary [name] for Electrolyte Monitoring and Replacement   Recent Labs: Potassium (mmol/L)  Date Value  10/15/2021 2.9 (L)  04/15/2014 3.9   Magnesium (mg/dL)  Date Value  10/15/2021 1.8  08/21/2012 1.9   Calcium (mg/dL)  Date Value  10/15/2021 8.4 (L)   Calcium, Total (mg/dL)  Date Value  04/15/2014 8.3 (L)   Albumin (g/dL)  Date Value  10/15/2021 2.4 (L)  04/15/2014 2.9 (L)   Phosphorus (mg/dL)  Date Value  10/15/2021 4.1   Sodium (mmol/L)  Date Value  10/15/2021 143  03/15/2020 145 (H)  04/15/2014 140    Assessment: 66 y/o male with h/o PUD, OSA, HTN, CKD III, substance abuse, CHF, Hep C and COPD and COVID 19 (02/2021) who is admitted with COPD exacerbation, Pseudomonas pneumonia on ceftazidime, HFpEF, and AKI. Intubated and on fentanyl. Pharmacy is asked to follow and replace electrolytes while in CCU  Goal of Therapy:  Electrolytes within normal limits  Plan:  --20 mEq KCl per tube x 1 --Follow-up electrolytes with AM labs tomorrow  Dallie Piles 10/15/2021 6:37 AM

## 2021-10-15 NOTE — Progress Notes (Signed)
Brief Nutrition Note  Consult received for enteral/tube feeding initiation and management.  Adult Enteral Nutrition Protocol initiated.  TF recommendations from note on 6/29 placed.  6/30: s/p PEG placement  Admitting Dx: COPD exacerbation ([name]) [J44.1] Obesity hypoventilation syndrome ([name]) [E66.2] Acute on chronic diastolic congestive heart failure ([name]) [I50.33] Acute respiratory failure with hypoxia and hypercapnia ([name]) [J96.01, J96.02] Acute on chronic respiratory failure with hypoxia and hypercapnia ([name]) [J96.21, J96.22]  Body mass index is 49.6 kg/m. Pt meets criteria for morbid obesity based on current BMI.  Labs:  Recent Labs  Lab 10/13/21 0623 10/14/21 0400 10/15/21 0414  NA 142 144 143  K 3.2* 3.7 2.9*  CL 103 109 109  CO2 '28 27 25  '$ BUN 84* 86* 73*  CREATININE 2.71* 2.77* 2.43*  CALCIUM 9.2 8.8* 8.4*  MG 1.9 1.9 1.8  PHOS 4.4 4.2 4.1  GLUCOSE 97 90 91    Gary Bibles, MS, RD, LDN Inpatient Clinical Dietitian Contact information available via Amion

## 2021-10-15 NOTE — Evaluation (Signed)
Physical Therapy Evaluation Patient Details Name: Gary [name] MRN: [id number] DOB: 11-Jan-1956 Today's Date: 10/15/2021  History of Present Illness  Patient was admitted 2/2 SOB, COPD exacerbation, acute metabolic encephalopathy, and acute on chronic hypoxic and hypercapnic respiratory failure in setting of acute decompensated HFpEF and AECOPD requiring intubation and mechanical ventilation. The pt is now s/p tracheostomy and PEG tube placement.  Clinical Impression  The pt is agreeable to PT. With the appropriate use of bed features the patient is able to achieve sitting at the EOB x10 with vitals remaining stable throughout the session. He requires less assistance to achieve EOB than he required to return to supine, as he needed more assistance for LE management. The pt continues to demonstrate functional strength but per his report in well below his baseline level of function. At this time the patient would greatly benefit from STR in order to best optimize his functional outcomes. PT will continue to follow.        Recommendations for follow up therapy are one component of a multi-disciplinary discharge planning process, led by the attending physician.  Recommendations may be updated based on patient status, additional functional criteria and insurance authorization.  Follow Up Recommendations Skilled nursing-short term rehab (<3 hours/day) Can patient physically be transported by private vehicle: No    Assistance Recommended at Discharge Frequent or constant Supervision/Assistance  Patient can return home with the following  Two people to help with walking and/or transfers;Two people to help with bathing/dressing/bathroom    Equipment Recommendations    Recommendations for Other Services       Functional Status Assessment Patient has had a recent decline in their functional status and demonstrates the ability to make significant improvements in function in a reasonable and predictable  amount of time.     Precautions / Restrictions Precautions Precautions: Fall Restrictions Weight Bearing Restrictions: No      Mobility  Bed Mobility Overal bed mobility: Needs Assistance Bed Mobility: Supine to Sit     Supine to sit: Min assist, HOB elevated     General bed mobility comments: Remove footboard, deflate bed, use of bed railings    Transfers                        Ambulation/Gait                  Stairs            Wheelchair Mobility    Modified Rankin (Stroke Patients Only)       Balance Overall balance assessment: Needs assistance Sitting-balance support: Feet supported, No upper extremity supported Sitting balance-[name] Scale: Fair                                       Pertinent Vitals/Pain Pain Assessment Pain Assessment: No/denies pain    Home Living Family/patient expects to be discharged to:: Private residence Living Arrangements: Spouse/significant other Available Help at Discharge: Family Type of Home: Apartment Home Access: Level entry       Home Layout: One level Home Equipment: Cane - single point      Prior Function Prior Level of Function : Needs assist       Physical Assist : ADLs (physical)   ADLs (physical): Bathing;IADLs Mobility Comments: Pt reports he's ambulatory with SPC       Hand Dominance   Dominant Hand:  Right    Extremity/Trunk Assessment   Upper Extremity Assessment Upper Extremity Assessment: LUE deficits/detail;Generalized weakness LUE Deficits / Details: limited shoulder ROM in which the patient reports is his baseline, ~90 degrees of shoulder flexion. LUE: Shoulder pain with ROM    Lower Extremity Assessment Lower Extremity Assessment: Generalized weakness       Communication   Communication: Tracheostomy  Cognition Arousal/Alertness: Awake/alert Behavior During Therapy: WFL for tasks assessed/performed Overall Cognitive Status: Within  Functional Limits for tasks assessed                                          General Comments      Exercises     Assessment/Plan    PT Assessment Patient needs continued PT services  PT Problem List Decreased strength;Decreased mobility;Decreased activity tolerance;Cardiopulmonary status limiting activity;Decreased balance;Decreased skin integrity;Obesity       PT Treatment Interventions Gait training;Functional mobility training;Neuromuscular re-education;Balance training;Therapeutic exercise;Therapeutic activities    PT Goals (Current goals can be found in the Care Plan section)  Acute Rehab PT Goals Patient Stated Goal: get back to baseline PT Goal Formulation: With patient Time For Goal Achievement: 10/29/21 Potential to Achieve Goals: Good    Frequency Min 2X/week     Co-evaluation               AM-PAC PT "6 Clicks" Mobility  Outcome Measure Help needed turning from your back to your side while in a flat bed without using bedrails?: A Lot Help needed moving from lying on your back to sitting on the side of a flat bed without using bedrails?: A Lot Help needed moving to and from a bed to a chair (including a wheelchair)?: A Lot Help needed standing up from a chair using your arms (e.g., wheelchair or bedside chair)?: A Lot Help needed to walk in hospital room?: Total Help needed climbing 3-5 steps with a railing? : Total 6 Click Score: 10    End of Session   Activity Tolerance: Patient tolerated treatment well Patient left: in bed;with nursing/sitter in room Nurse Communication: Mobility status PT Visit Diagnosis: Unsteadiness on feet (R26.81);Muscle weakness (generalized) (M62.81);Difficulty in walking, not elsewhere classified (R26.2)    Time: 4944-9675 PT Time Calculation (min) (ACUTE ONLY): 59 min   Charges:   PT Evaluation $PT Eval Moderate Complexity: 1 Mod PT Treatments $Therapeutic Activity: 38-52 mins        12:06 PM,  10/15/21 Asael Pann A. Saverio Danker PT, DPT Physical Therapist - [facility]   Vaibhav Fogleman A Alin Chavira 10/15/2021, 11:59 AM

## 2021-10-15 NOTE — TOC Progression Note (Signed)
Transition of Care [facility]) - Progression Note    Patient Details  Name: Gary [name] MRN: [id number] Date of Birth: 08-Dec-1955  Transition of Care [facility]) CM/SW Contact  Shelbie Hutching, RN Phone Number: 10/15/2021, 2:54 PM  Clinical Narrative:    Patient was tolerating trach collar today.  Staff is trying to encourage patient to work with physical therapy.  He did get his PEG tube on Friday. Plan to look for Vent/trach SNF bed- there are only 3 in [place]- called and left a message for Heather Roberts in Admissions- [phone] her office number, main SNF number is [phone]. [facility] in [place]- unable to get through on Sunday will ask weekday SW to follow up - [phone].  [facility] in [place] - called and left a message for Danae Chen in admissions-[phone]  and will fax referral to [phone].   Expected Discharge Plan: Long Term Acute Care (LTAC) Barriers to Discharge: Continued Medical Work up  Expected Discharge Plan and Services Expected Discharge Plan: Long Term Acute Care (LTAC)   Discharge Planning Services: CM Consult Post Acute Care Choice: Long Term Acute Care (LTAC) Living arrangements for the past 2 months: Single Family Home                 DME Arranged: N/A DME Agency: NA       HH Arranged: NA HH Agency: NA         Social Determinants of Health (SDOH) Interventions    Readmission Risk Interventions    05/04/2021    1:26 PM 04/07/2021    1:39 PM 03/12/2021   11:54 AM  Readmission Risk Prevention Plan  Transportation Screening Complete Complete Complete  PCP or Specialist Appt within 3-5 Days  Complete Complete  HRI or [place]   Complete  Social Work Consult for [place] Planning/Counseling   Complete  Palliative Care Screening  Not Applicable Not Applicable  Medication Review Press photographer) Complete Complete Complete  PCP or Specialist appointment within 3-5 days of discharge Complete    HRI  or [place] Complete    SW Recovery Care/Counseling Consult Complete    [place] Not Applicable

## 2021-10-15 NOTE — NC FL2 (Signed)
Agency LEVEL OF CARE SCREENING TOOL     IDENTIFICATION  Patient Name: Gary [name] Birthdate: [birth year] Sex: male Admission Date (Current Location): 09/19/2021  [facility] and [place] Number:  Engineering geologist and Address:  [facility], [address], [place], [place] [address]      Provider Number: [id number]  Attending Physician Name and Address:  Flora Lipps, MD  Relative Name and Phone Number:  [name]- sister- [phone]    Current Level of Care: Hospital Recommended Level of Care: [facility] SNF) Prior Approval Number:    Date Approved/Denied:   PASRR Number:    Discharge Plan: SNF    Current Diagnoses: Patient Active Problem List   Diagnosis Date Noted   Pressure injury of skin 09/27/2021   Acute on chronic respiratory failure with hypercapnia ([place]) 08/07/2021   COPD (chronic obstructive pulmonary disease) ([place])    HLD (hyperlipidemia)    Iron deficiency anemia    Chronic kidney disease, stage 3a ([place]) 07/22/2021   Acute cardiac pulmonary edema ([place]) 07/22/2021   Acute on chronic respiratory failure with hypoxia and hypercapnia ([name]) 07/20/2021   Chronic obstructive pulmonary disease ([name])    Positive D dimer    CHF exacerbation ([place]) 05/03/2021   Acute CHF (congestive heart failure) ([place]) 04/04/2021   Weakness    Pneumonia 03/11/2021   Acute respiratory failure with hypoxia and hypercapnia ([place]) 03/11/2021   (HFpEF) heart failure with preserved ejection fraction ([name])    Chronic respiratory failure with hypoxia ([place])    Hyperlipidemia 02/19/2021   Acute respiratory distress syndrome (ARDS) due to COVID-19 virus ([place])    Acute on chronic respiratory failure ([place]) 02/18/2021   Spondylosis without myelopathy or radiculopathy, lumbosacral region 11/15/2020   Lumbosacral facet joint syndrome (Bilateral) 10/31/2020   Generalized osteoarthritis of multiple sites 10/31/2020   AKI  (acute kidney injury) ([place]) 03/04/2020   Hyperkalemia 03/04/2020   COPD with acute exacerbation ([place]) 03/02/2020   Marijuana use 01/17/2020   Elevated BUN 01/17/2020   Hypocalcemia 01/17/2020   Hypochloremia 01/17/2020   Low thyroid stimulating hormone (TSH) level 01/17/2020   Vitamin D deficiency 01/17/2020   Elevated sed rate 01/17/2020   Thrombocytopenia ([place]) 01/17/2020   Elevated hematocrit 01/17/2020   Elevated brain natriuretic peptide (BNP) level 01/17/2020   Positive hepatitis C antibody test 01/17/2020   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 mass index (BMI) of 50.0 to 59.9 in adult ([place]) 01/17/2020   Osteoarthritis of glenohumeral joints (Bilateral) 01/17/2020   Osteoarthritis of AC (acromioclavicular) joints (Bilateral) 01/17/2020   Lumbar Grade 1 Anterolisthesis of L4 over L5 (3 mm) (stable) 01/17/2020   Osteoarthritis of lumbar spine (L3 through S1) 01/17/2020   Lumbar facet osteoarthritis (Multilevel) (Bilateral) 01/17/2020   Osteoarthritis of hips (Bilateral) 01/17/2020   Osteoarthritis of knees (Bilateral) 01/17/2020   Tricompartment osteoarthritis of knees (Bilateral) 01/17/2020   Patellofemoral arthralgia of knees (Bilateral) 01/17/2020   Plantar and Achilles calcaneal spurs (Bilateral) 01/17/2020   Ankle edema (Bilateral) 01/17/2020   Traumatic osteoarthritis of hands (Bilateral) 01/17/2020   Polysubstance abuse ([place]) 78/46/9629   Acute metabolic encephalopathy 52/84/1324   Chronic pain syndrome 07/14/2019   Pharmacologic therapy 07/14/2019   Disorder of skeletal system 07/14/2019   Problems influencing health status 07/14/2019   Chronic low back pain (1ry area of Pain) (Bilateral) (L>R) w/o sciatica 07/14/2019   Chronic shoulder pain (2ry area of Pain) (Bilateral) (L>R) 07/14/2019   Chronic feet pain (3ry area  of Pain) (Bilateral) 07/14/2019   Chronic knee pain (4th area of Pain) (Bilateral) (L>R) 07/14/2019   Chronic hip pain (5th area of  Pain) (Bilateral) (L>R) 07/14/2019   Chronic hand pain (6th area of Pain) (Bilateral) (L>R) 07/14/2019   Lumbar facet arthropathy 07/14/2019   DDD (degenerative disc disease), lumbosacral 07/14/2019   Closed fracture of shaft of humerus, sequela (Left) 07/14/2019   DDD (degenerative disc disease), cervical 07/14/2019   Tobacco use 05/04/2017   Lymphedema 05/04/2017   CHF (congestive heart failure) ([place]) 04/12/2017   Acute on chronic diastolic CHF (congestive heart failure) ([place]) 02/28/2015   Obstructive sleep apnea 02/28/2015   Acute on chronic diastolic congestive heart failure ([name])    Acute on chronic respiratory failure with hypoxia ([name])    Edema of both feet    Closed fracture of scapula, sequela (Left) 04/01/2014   Obesity, Class III, BMI 40-49.9 (morbid obesity) ([place]) 04/01/2014   HTN (hypertension) 04/01/2014   Multiple rib fractures 03/27/2014    Orientation RESPIRATION BLADDER Height & Weight     Self, Place  Vent, O2 (trach collar trials- [place] XLT 1m) Indwelling catheter Weight: (!) 180 kg Height:  '6\' 3"'$  (190.5 cm)  BEHAVIORAL SYMPTOMS/MOOD NEUROLOGICAL BOWEL NUTRITION STATUS      Incontinent Feeding tube  AMBULATORY STATUS COMMUNICATION OF NEEDS Skin   Extensive Assist Non-Verbally (trach) Normal                       Personal Care Assistance Level of Assistance  Bathing, Feeding, Dressing Bathing Assistance: Maximum assistance Feeding assistance: Maximum assistance Dressing Assistance: Maximum assistance     Functional Limitations Info             SPECIAL CARE FACTORS FREQUENCY  PT (By licensed PT), OT (By licensed OT), Speech therapy     PT Frequency: 5 days per week OT Frequency: 5 days per week     Speech Therapy Frequency: follow for passi muir valve      Contractures Contractures Info: Not present    Additional Factors Info  Code Status, Allergies Code Status Info: Full Allergies Info: NKA           Current Medications  (10/15/2021):  This is the current hospital active medication list Current Facility-Administered Medications  Medication Dose Route Frequency Provider Last Rate Last Admin   0.9 %  sodium chloride infusion  250 mL Intravenous Continuous NTeressa Lower NP   Stopped at 10/07/21 0542   0.9 %  sodium chloride infusion   Intravenous Continuous BKris Hartmann NP 5 mL/hr at 10/15/21 0700 Infusion Verify at 10/15/21 0700   acetaminophen (TYLENOL) suppository 650 mg  650 mg Rectal Q6H PRN NIvor Costa MD       acetaminophen (TYLENOL) tablet 650 mg  650 mg Per Tube Q6H PRN BRenda Rolls RPH       albuterol (PROVENTIL) (2.5 MG/3ML) 0.083% nebulizer solution 2.5 mg  2.5 mg Nebulization Q4H PRN KFlora Lipps MD   2.5 mg at 10/15/21 0309   alteplase (CATHFLO ACTIVASE) injection 2 mg  2 mg Intracatheter Once PRN BColon Flattery NP       atorvastatin (LIPITOR) tablet 10 mg  10 mg Per Tube Daily MZachery ConchA, MD   10 mg at 10/15/21 1030   bisacodyl (DULCOLAX) suppository 10 mg  10 mg Rectal Daily PRN KBradly Bienenstock NP       budesonide (PULMICORT) nebulizer solution 0.5 mg  0.5 mg Nebulization BID  Darel Hong D, NP   0.5 mg at 10/15/21 2951   ceFAZolin (ANCEF) IVPB 1 g/50 mL premix  1 g Intravenous 30 min Pre-Op Kris Hartmann, NP       cefTAZidime (FORTAZ) 2 g in sodium chloride 0.9 % 100 mL IVPB  2 g Intravenous BID Flora Lipps, MD 200 mL/hr at 10/15/21 1024 2 g at 10/15/21 1024   Chlorhexidine Gluconate Cloth 2 % PADS 6 each  6 each Topical Daily Ivor Costa, MD   6 each at 10/14/21 1949   cholecalciferol (VITAMIN D3) tablet 2,000 Units  2,000 Units Per Tube Daily Zachery Conch A, MD   2,000 Units at 10/15/21 1030   clonazePAM (KLONOPIN) disintegrating tablet 0.5 mg  0.5 mg Per Tube BID PRN Flora Lipps, MD   0.5 mg at 10/14/21 2129   dextrose 10 % infusion   Intravenous Continuous Rust-Chester, Britton L, NP 20 mL/hr at 10/15/21 0700 Infusion Verify at 10/15/21 0700   epoetin alfa (EPOGEN)  injection 10,000 Units  10,000 Units Intravenous Q M,W,F-HD Colon Flattery, NP   10,000 Units at 10/11/21 1732   famotidine (PEPCID) tablet 40 mg  40 mg Oral Once PRN Kris Hartmann, NP       feeding supplement (NEPRO CARB STEADY) liquid 1,000 mL  1,000 mL Per Tube Continuous Flora Lipps, MD 70 mL/hr at 10/15/21 1208 1,000 mL at 10/15/21 1208   feeding supplement (PROSource TF) liquid 90 mL  90 mL Per Tube TID Flora Lipps, MD   90 mL at 10/15/21 1030   glycopyrrolate (ROBINUL) injection 0.4 mg  0.4 mg Intravenous Q6H PRN Tyler Pita, MD   0.4 mg at 10/09/21 2141   heparin injection 1,000 Units  1,000 Units Dialysis PRN Colon Flattery, NP   1,000 Units at 10/11/21 1957   heparin sodium (porcine) injection 2,000 Units  2,000 Units Intravenous Q M,W,F-HD Colon Flattery, NP   2,000 Units at 10/11/21 1732   HYDROmorphone (DILAUDID) injection 0.5-1 mg  0.5-1 mg Intravenous Q2H PRN Nelle Don, MD       ipratropium-albuterol (DUONEB) 0.5-2.5 (3) MG/3ML nebulizer solution 3 mL  3 mL Nebulization BID Nelle Don, MD   3 mL at 10/15/21 8841   methylPREDNISolone sodium succinate (SOLU-MEDROL) 125 mg/2 mL injection 125 mg  125 mg Intravenous Once PRN Kris Hartmann, NP       midazolam (VERSED) 2 MG/ML syrup 8 mg  8 mg Oral Once PRN Kris Hartmann, NP       midazolam (VERSED) injection 2 mg  2 mg Intravenous Q1H PRN Lang Snow, NP   2 mg at 10/14/21 2315   multivitamin (RENA-VIT) tablet 1 tablet  1 tablet Per Tube QHS Flora Lipps, MD   1 tablet at 10/14/21 2129   nutrition supplement (JUVEN) (JUVEN) powder packet 1 packet  1 packet Per Tube BID BM Flora Lipps, MD   1 packet at 10/15/21 1427   Oral care mouth rinse  15 mL Mouth Rinse Q2H Lang Snow, NP   15 mL at 10/15/21 1427   oxyCODONE (ROXICODONE) 5 MG/5ML solution 10 mg  10 mg Per Tube Q8H Zachery Conch A, MD   10 mg at 10/15/21 1427   pantoprazole (PROTONIX) injection 40 mg  40 mg Intravenous Q12H  Darel Hong D, NP   40 mg at 10/15/21 1026   pentafluoroprop-tetrafluoroeth (GEBAUERS) aerosol 1 application   1 application  Topical PRN Colon Flattery, NP  polyethylene glycol (MIRALAX / GLYCOLAX) packet 17 g  17 g Per Tube BID Flora Lipps, MD   17 g at 10/14/21 2129     Discharge Medications: Please see discharge summary for a list of discharge medications.  Relevant Imaging Results:  Relevant Lab Results:   Additional Information SSN:[id number]    - Hemodialysis patient MWF- perm cath  Shelbie Hutching, RN
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
## 2021-10-15 NOTE — Progress Notes (Signed)
[facility]  ROUNDING NOTE   Subjective:   Gary [name] is a 66 year old male with past medical conditions including COPD requiring 2 L nasal cannula, and congestive heart failure.  Patient presents to the emergency department with shortness of breath.  Patient has been admitted for COPD exacerbation ([place]) [J44.1] Obesity hypoventilation syndrome ([place]) [E66.2] Acute on chronic diastolic congestive heart failure ([name]) [I50.33] Acute respiratory failure with hypoxia and hypercapnia ([name]) [J96.01, J96.02] Acute on chronic respiratory failure with hypoxia and hypercapnia ([name]) [K27.06, J96.22]  Patient is known and has recently been followed by Dr. Holley Raring for acute kidney injury, resolved at his May appointment.    Update: Patient resting comfortably. Able to nod yes/no to questions. Urine output 1.8 L over the preceding 24 hours.  BUN down to 73.  Objective:  Vital signs in last 24 hours:  Temp:  [97.8 F (36.6 C)-99 F (37.2 C)] 98 F (36.7 C) (07/02 0400) Pulse Rate:  [43-167] 49 (07/02 1100) Resp:  [8-27] 24 (07/02 1100) BP: (92-148)/(58-121) 139/97 (07/02 1100) SpO2:  [91 %-100 %] 98 % (07/02 1100) FiO2 (%):  [35 %-40 %] 35 % (07/02 0700)  Weight change:  Filed Weights   10/12/21 0500 10/13/21 0500 10/14/21 0800  Weight: (!) 159 kg (!) 168 kg (!) 180 kg    Intake/Output: I/O last 3 completed shifts: In: 1504.8 [I.V.:944.7; Other:200; NG/GT:60; IV Piggyback:300.1] Out: 2850 [Urine:2600; Drains:250]   Intake/Output this shift:  No intake/output data recorded.  Physical Exam: General: Chronically ill-appearing  Head: Normocephalic, atraumatic.   Eyes: Anicteric  Lungs:  Scattered rhonchi, vent assisted  Heart: Regular rhythm  Abdomen:  Soft, nontender, obese, PEG tube in place  Extremities: No peripheral edema.  Neurologic: Alert, respond to simple questions  Skin: No lesions  Access: Right IJ temp cath    Basic Metabolic Panel: Recent Labs   Lab 10/11/21 0441 10/12/21 0614 10/13/21 0623 10/14/21 0400 10/15/21 0414  NA 141 139 142 144 143  K 3.5 3.4* 3.2* 3.7 2.9*  CL 104 102 103 109 109  CO2 '26 26 28 27 25  '$ GLUCOSE 122* 114* 97 90 91  BUN 117* 81* 84* 86* 73*  CREATININE 3.53* 2.60* 2.71* 2.77* 2.43*  CALCIUM 9.2 9.2 9.2 8.8* 8.4*  MG 2.0 1.9 1.9 1.9 1.8  PHOS 5.0* 3.9 4.4 4.2 4.1     Liver Function Tests: Recent Labs  Lab 10/11/21 0441 10/12/21 0614 10/13/21 0623 10/14/21 0400 10/15/21 0414  ALBUMIN 2.5* 2.6* 2.5* 2.5* 2.4*    No results for input(s): "LIPASE", "AMYLASE" in the last 168 hours. No results for input(s): "AMMONIA" in the last 168 hours.  CBC: Recent Labs  Lab 10/11/21 0441 10/12/21 0614 10/13/21 0623 10/14/21 0400 10/15/21 0414  WBC 7.7 7.3 6.4 6.2 6.0  NEUTROABS 5.9 5.3 3.9 4.2 3.8  HGB 8.6* 8.8* 9.0* 8.7* 8.7*  HCT 29.9* 30.8* 31.8* 30.7* 31.2*  MCV 79.1* 78.0* 79.9* 78.7* 80.4  PLT 263 273 294 275 293     Cardiac Enzymes: No results for input(s): "CKTOTAL", "CKMB", "CKMBINDEX", "TROPONINI" in the last 168 hours.  BNP: Invalid input(s): "POCBNP"  CBG: Recent Labs  Lab 10/14/21 1946 10/14/21 2340 10/15/21 0333 10/15/21 0746 10/15/21 1109  GLUCAP 88 77 89 87 84     Microbiology: Results for orders placed or performed during the hospital encounter of 09/19/21  SARS Coronavirus 2 by RT PCR (hospital order, performed in [facility] hospital lab) *cepheid single result test* Anterior Nasal Swab  Status: None   Collection Time: 09/19/21  9:57 AM   Specimen: Anterior Nasal Swab  Result Value Ref Range Status   SARS Coronavirus 2 by RT PCR NEGATIVE NEGATIVE Final    Comment: (NOTE) SARS-CoV-2 target nucleic acids are NOT DETECTED.  The SARS-CoV-2 RNA is generally detectable in upper and lower respiratory specimens during the acute phase of infection. The lowest concentration of SARS-CoV-2 viral copies this assay can detect is 250 copies / mL. A negative result  does not preclude SARS-CoV-2 infection and should not be used as the sole basis for treatment or other patient management decisions.  A negative result may occur with improper specimen collection / handling, submission of specimen other than nasopharyngeal swab, presence of viral mutation(s) within the areas targeted by this assay, and inadequate number of viral copies (<250 copies / mL). A negative result must be combined with clinical observations, patient history, and epidemiological information.  Fact Sheet for Patients:   https://www.patel.info/  Fact Sheet for Healthcare Providers: https://hall.com/  This test is not yet approved or  cleared by the [place] FDA and has been authorized for detection and/or diagnosis of SARS-CoV-2 by FDA under an Emergency Use Authorization (EUA).  This EUA will remain in effect (meaning this test can be used) for the duration of the COVID-19 declaration under Section 564(b)(1) of the Act, 21 U.S.C. section 360bbb-3(b)(1), unless the authorization is terminated or revoked sooner.  Performed at [facility], [address] [place], [place] [address]   Culture, Respiratory w Gram Stain     Status: None   Collection Time: 09/19/21 11:55 AM   Specimen: SPU  Result Value Ref Range Status   Specimen Description   Final    SPUTUM Performed at [facility], [place] [address], [place], [place] [address]    Special Requests   Final    NONE Performed at [facility], [place], [place] [address]    Gram Stain   Final    FEW WBC PRESENT, PREDOMINANTLY PMN FEW GRAM POSITIVE COCCI IN PAIRS Performed at [facility], [place] [address], [place], [place] [address]    Culture RARE [name]  Final   Report Status 09/24/2021 FINAL  Final   Organism ID, Bacteria [name]  Final      Susceptibility   [name] - MIC*     CEFTAZIDIME 4 SENSITIVE Sensitive     CIPROFLOXACIN <=0.25 SENSITIVE Sensitive     GENTAMICIN 2 SENSITIVE Sensitive     IMIPENEM 1 SENSITIVE Sensitive     PIP/TAZO 8 SENSITIVE Sensitive     CEFEPIME 2 SENSITIVE Sensitive     * RARE [name]  MRSA Next Gen by PCR, Nasal     Status: None   Collection Time: 09/19/21  6:10 PM   Specimen: Nasal Mucosa; Nasal Swab  Result Value Ref Range Status   MRSA by PCR Next Gen NOT DETECTED NOT DETECTED Final    Comment: (NOTE) The GeneXpert MRSA Assay (FDA approved for NASAL specimens only), is one component of a comprehensive MRSA colonization surveillance program. It is not intended to diagnose MRSA infection nor to guide or monitor treatment for MRSA infections. Test performance is not FDA approved in patients less than 79 years old. Performed at [facility], [address] [place],  [address]   Gastrointestinal Panel by PCR , Stool     Status: Abnormal   Collection Time: 09/21/21 10:53 AM  Specimen: Stool  Result Value Ref Range Status   Campylobacter species NOT DETECTED NOT DETECTED Final   Plesimonas shigelloides NOT DETECTED NOT DETECTED Final   Salmonella species NOT DETECTED NOT DETECTED Final   Yersinia enterocolitica DETECTED (A) NOT DETECTED Final    Comment: RESULT CALLED TO, READ BACK BY AND VERIFIED WITH: [facility] JACKSON AT 1126 09/22/21.PMF    Vibrio species NOT DETECTED NOT DETECTED Final   Vibrio cholerae NOT DETECTED NOT DETECTED Final   Enteroaggregative E coli (EAEC) NOT DETECTED NOT DETECTED Final   Enteropathogenic E coli (EPEC) NOT DETECTED NOT DETECTED Final   Enterotoxigenic E coli (ETEC) NOT DETECTED NOT DETECTED Final   Shiga like toxin producing E coli (STEC) NOT DETECTED NOT DETECTED Final   Shigella/Enteroinvasive E coli (EIEC) NOT DETECTED NOT DETECTED Final   Cryptosporidium NOT DETECTED NOT DETECTED Final   Cyclospora cayetanensis NOT DETECTED NOT DETECTED Final   Entamoeba  histolytica NOT DETECTED NOT DETECTED Final   Giardia lamblia NOT DETECTED NOT DETECTED Final   Adenovirus F40/41 NOT DETECTED NOT DETECTED Final   Astrovirus NOT DETECTED NOT DETECTED Final   Norovirus GI/GII NOT DETECTED NOT DETECTED Final   Rotavirus A NOT DETECTED NOT DETECTED Final   Sapovirus (I, II, IV, and V) NOT DETECTED NOT DETECTED Final    Comment: Performed at [facility], [address] [place], [place] [address]  C Difficile Quick Screen w PCR reflex     Status: None   Collection Time: 09/21/21 11:30 AM   Specimen: STOOL  Result Value Ref Range Status   C Diff antigen NEGATIVE NEGATIVE Final   C Diff toxin NEGATIVE NEGATIVE Final   C Diff interpretation No C. difficile detected.  Final    Comment: Performed at [facility], [address] [name], [place] [address]  Culture, blood (Routine X 2) w Reflex to ID Panel     Status: None   Collection Time: 09/21/21 12:28 PM   Specimen: BLOOD  Result Value Ref Range Status   Specimen Description BLOOD BLOOD RIGHT HAND  Final   Special Requests   Final    BOTTLES DRAWN AEROBIC AND ANAEROBIC Blood Culture adequate volume   Culture   Final    NO GROWTH 5 DAYS Performed at [facility] [address] [place], [place] [address]    Report Status 09/26/2021 FINAL  Final  Culture, blood (Routine X 2) w Reflex to ID Panel     Status: None   Collection Time: 09/21/21  2:08 PM   Specimen: BLOOD  Result Value Ref Range Status   Specimen Description BLOOD BLOOD LEFT HAND  Final   Special Requests   Final    BOTTLES DRAWN AEROBIC AND ANAEROBIC Blood Culture adequate volume   Culture   Final    NO GROWTH 5 DAYS Performed at [facility], [address] [place], [place] [address]    Report Status 09/26/2021 FINAL  Final  Respiratory (~20 pathogens) panel by PCR     Status: None   Collection Time: 09/21/21  6:14 PM   Specimen: Nasopharyngeal Swab; Respiratory  Result Value Ref Range  Status   Adenovirus NOT DETECTED NOT DETECTED Final   Coronavirus 229E NOT DETECTED NOT DETECTED Final    Comment: (NOTE) The Coronavirus on the Respiratory Panel, DOES NOT test for the novel  Coronavirus (2019 nCoV)    Coronavirus HKU1 NOT DETECTED NOT DETECTED Final   Coronavirus NL63 NOT DETECTED NOT DETECTED Final  Coronavirus OC43 NOT DETECTED NOT DETECTED Final   Metapneumovirus NOT DETECTED NOT DETECTED Final   Rhinovirus / Enterovirus NOT DETECTED NOT DETECTED Final   Influenza A NOT DETECTED NOT DETECTED Final   Influenza B NOT DETECTED NOT DETECTED Final   Parainfluenza Virus 1 NOT DETECTED NOT DETECTED Final   Parainfluenza Virus 2 NOT DETECTED NOT DETECTED Final   Parainfluenza Virus 3 NOT DETECTED NOT DETECTED Final   Parainfluenza Virus 4 NOT DETECTED NOT DETECTED Final   Respiratory Syncytial Virus NOT DETECTED NOT DETECTED Final   Bordetella pertussis NOT DETECTED NOT DETECTED Final   Bordetella Parapertussis NOT DETECTED NOT DETECTED Final   Chlamydophila pneumoniae NOT DETECTED NOT DETECTED Final   Mycoplasma pneumoniae NOT DETECTED NOT DETECTED Final    Comment: Performed at [facility], [place] [address], [place], [place] [address]  Culture, Respiratory w Gram Stain     Status: None   Collection Time: 10/06/21 10:59 AM   Specimen: Tracheal Aspirate; Respiratory  Result Value Ref Range Status   Specimen Description   Final    TRACHEAL ASPIRATE Performed at [facility], [address], [place], [place] [address]    Special Requests   Final    NONE Performed at [facility], [address] [place], [place] [address]    Gram Stain   Final    ABUNDANT WBC PRESENT, PREDOMINANTLY PMN ABUNDANT GRAM NEGATIVE RODS Performed at [facility], [place] [address], [place],  [address]    Culture ABUNDANT [name]  Final   Report Status 10/09/2021 FINAL  Final   Organism ID, Bacteria [name]  Final       Susceptibility   [name] - MIC*    CEFTAZIDIME 8 SENSITIVE Sensitive     CIPROFLOXACIN <=0.25 SENSITIVE Sensitive     GENTAMICIN 2 SENSITIVE Sensitive     IMIPENEM 1 SENSITIVE Sensitive     * ABUNDANT [name]    Coagulation Studies: No results for input(s): "LABPROT", "INR" in the last 72 hours.  Urinalysis: No results for input(s): "COLORURINE", "LABSPEC", "PHURINE", "GLUCOSEU", "HGBUR", "BILIRUBINUR", "KETONESUR", "PROTEINUR", "UROBILINOGEN", "NITRITE", "LEUKOCYTESUR" in the last 72 hours.  Invalid input(s): "APPERANCEUR"    Imaging: IR GASTROSTOMY TUBE MOD SED  Result Date: 10/13/2021 INDICATION: Need for long-term enteric access EXAM: Placement of percutaneous gastrostomy tube using fluoroscopic guidance MEDICATIONS: Glucagon 1 mg IV ANESTHESIA/SEDATION: Moderate (conscious) sedation was employed during this procedure. A total of Versed 1 mg and Fentanyl 75 mcg was administered intravenously by the radiology nurse. Total intra-service moderate Sedation Time: 26 minutes. The patient's level of consciousness and vital signs were monitored continuously by radiology nursing throughout the procedure under my direct supervision. CONTRAST:  25 mL-administered into the gastric lumen. FLUOROSCOPY: Radiation Exposure Index (as provided by the fluoroscopic device): 4.4 minutes (88 mGy) COMPLICATIONS: None immediate. PROCEDURE: Informed written consent was obtained from the patient after a thorough discussion of the procedural risks, benefits and alternatives. All questions were addressed. Maximal Sterile Barrier Technique was utilized including caps, mask, sterile gowns, sterile gloves, sterile drape, hand hygiene and skin antiseptic. A timeout was performed prior to the initiation of the procedure. Review of pre-procedure CT scan demonstrates an adequate window for percutaneous placement of a gastrostomy tube. The patient was placed supine on the exam table. Using  fluoroscopic guidance, an angled 5 French catheter was passed through the nares into the stomach. It was secured to the nose, and the stomach was insufflated with air.  The abdomen was prepped and draped in the standard sterile fashion. After insufflating the stomach with air, puncture sites were selected and local analgesia was obtained with 1% lidocaine. Using fluoroscopic guidance, a gastropexy needle was advanced into the stomach and the T-bar suture was released. Entry into the stomach was confirmed with fluoroscopy, aspiration of air, and injection of contrast material. This was repeated with an additional gastropexy suture (for a total of 2 fasteners). Note that placement of these fasteners required multiple passes due to challenging body habitus. At the center of these gastropexy sutures, a dermatotomy was performed. An 18 gauge needle was then passed into the stomach, and position within the gastric lumen again confirmed under fluoroscopy using aspiration of air and injection of contrast material. An Amplatz guidewire was passed through this needle and intraluminal placement was confirmed with fluoroscopy. The needle was removed, and over the guidewire, a 20 French balloon gastrostomy tube with a coaxial 10 mm balloon was advanced into the percutaneous tract. Dilation of the percutaneous tract was then performed using the balloon, followed by advancement of the gastrostomy tube into the gastric lumen. The retention balloon was then inflated with 20 mL of sterile water, and the tube was brought back to the gastric wall. The wire and balloon were removed. The external bumper was brought to the skin. Location of the gastrostomy tube within the stomach was then confirmed with injection of contrast material, opacifying the gastric lumen. The gastrostomy tube was flushed with sterile water, and secured to the skin using a dressing. The patient tolerated the procedure well without immediate complication, and was  transferred to recovery in stable condition. IMPRESSION: Successful placement of a percutaneous 20 French balloon gastrostomy tube using fluoroscopic guidance. Refer to instructions in the EMR for use. Electronically Signed   By: Albin Felling M.D.   On: 10/13/2021 15:54     Medications:    sodium chloride Stopped (10/07/21 0542)   sodium chloride 5 mL/hr at 10/15/21 0700    ceFAZolin (ANCEF) IV     cefTAZidime (FORTAZ)  IV 2 g (10/15/21 1024)   dextrose 20 mL/hr at 10/15/21 0700   feeding supplement (NEPRO CARB STEADY)      atorvastatin  10 mg Per Tube Daily   budesonide (PULMICORT) nebulizer solution  0.5 mg Nebulization BID   Chlorhexidine Gluconate Cloth  6 each Topical Daily   cholecalciferol  2,000 Units Per Tube Daily   epoetin (EPOGEN/PROCRIT) injection  10,000 Units Intravenous Q M,W,F-HD   feeding supplement (PROSource TF)  90 mL Per Tube TID   heparin sodium (porcine)  2,000 Units Intravenous Q M,W,F-HD   ipratropium-albuterol  3 mL Nebulization BID   multivitamin  1 tablet Per Tube QHS   nutrition supplement (JUVEN)  1 packet Per Tube BID BM   mouth rinse  15 mL Mouth Rinse Q2H   oxyCODONE  10 mg Per Tube Q8H   pantoprazole  40 mg Intravenous Q12H   polyethylene glycol  17 g Per Tube BID   acetaminophen, acetaminophen, albuterol, alteplase, bisacodyl, clonazepam, famotidine, glycopyrrolate, heparin, HYDROmorphone (DILAUDID) injection, methylPREDNISolone (SOLU-MEDROL) injection, midazolam, midazolam, pentafluoroprop-tetrafluoroeth  Assessment/ Plan:  [name] is a 66 y.o.  male with past medical conditions including COPD requiring 2 L nasal cannula, and congestive heart failure.  Patient presents to the emergency department with shortness of breath.  Patient has been admitted for COPD exacerbation ([place]) [J44.1] Obesity hypoventilation syndrome ([place]) [E66.2] Acute on chronic diastolic congestive heart failure ([place]) [I50.33]  Acute respiratory failure with hypoxia  and hypercapnia ([name]) [J96.01, J96.02] Acute on chronic respiratory failure with hypoxia and hypercapnia ([name]) [J96.21, J96.22]    Acute Kidney Injury with hyperkalemia, cause unidentified.  No IV contrast exposure and brief hypotensive event. Normal renal function 2 days prior.Renal Ultrasound shows mild right obstruction , simple cyst in  left kidney and bladder distendion.   -P patient continues to have excellent urine output at 1.8 L over the preceding 24 hours and creatinine down to 2.4.  Hold further dialysis.  Consider removing dialysis catheter next week.  Lab Results  Component Value Date   CREATININE 2.43 (H) 10/15/2021   CREATININE 2.77 (H) 10/14/2021   CREATININE 2.71 (H) 10/13/2021    Intake/Output Summary (Last 24 hours) at 10/15/2021 1152 Last data filed at 10/15/2021 0800 Gross per 24 hour  Intake 837.16 ml  Output 1800 ml  Net -962.84 ml    Acute respiratory failure, Pseudomonas noted in sputum on September 19, 2021. IV antibiotics completed Monitoring of respiratory status as per pulmonary/critical care.   Chronic diastolic heart failure.  Echo from 08/07/2021 shows EF 60 to 65% with a mildly dilated left ventricular cavity and grade 1 diastolic dysfunction. Patient with stable volume status.  Anemia of critical illness, Hemoglobin down slightly to 8.7.  Was on Epogen with dialysis.  Consider transitioning this to subcutaneous Epogen next week.    LOS: 26 Devaeh Amadi 7/2/202311:52 AM

## 2021-10-15 NOTE — Progress Notes (Signed)
SLP Cancellation Note  Patient Details Name: Gary [name] MRN: [id number] DOB: 08-Jan-1956   Cancelled treatment:       Reason Eval/Treat Not Completed: SLP consult for PMV received and appreciated. Chart review completed. Noted dated for 10/16/20, and pt currently requiring ventilator support. Will check on pt next date for appropriateness for PMV evaluation.  Cherrie Gauze, M.S., [facility] [phone] [place])   Quintella Baton 10/15/2021, 8:36 AM

## 2021-10-16 DIAGNOSIS — J9621 Acute and chronic respiratory failure with hypoxia: Secondary | ICD-10-CM | POA: Diagnosis not present

## 2021-10-16 DIAGNOSIS — J9622 Acute and chronic respiratory failure with hypercapnia: Secondary | ICD-10-CM | POA: Diagnosis not present

## 2021-10-16 LAB — CBC WITH DIFFERENTIAL/PLATELET
Abs Immature Granulocytes: 0.01 10*3/uL (ref 0.00–0.07)
Basophils Absolute: 0.1 10*3/uL (ref 0.0–0.1)
Basophils Relative: 1 %
Eosinophils Absolute: 0.4 10*3/uL (ref 0.0–0.5)
Eosinophils Relative: 5 %
HCT: 31.5 % — ABNORMAL LOW (ref 39.0–52.0)
Hemoglobin: 8.7 g/dL — ABNORMAL LOW (ref 13.0–17.0)
Immature Granulocytes: 0 %
Lymphocytes Relative: 20 %
Lymphs Abs: 1.4 10*3/uL (ref 0.7–4.0)
MCH: 22.4 pg — ABNORMAL LOW (ref 26.0–34.0)
MCHC: 27.6 g/dL — ABNORMAL LOW (ref 30.0–36.0)
MCV: 81 fL (ref 80.0–100.0)
Monocytes Absolute: 0.9 10*3/uL (ref 0.1–1.0)
Monocytes Relative: 13 %
Neutro Abs: 4.1 10*3/uL (ref 1.7–7.7)
Neutrophils Relative %: 61 %
Platelets: 297 10*3/uL (ref 150–400)
RBC: 3.89 MIL/uL — ABNORMAL LOW (ref 4.22–5.81)
RDW: 17.2 % — ABNORMAL HIGH (ref 11.5–15.5)
WBC: 6.7 10*3/uL (ref 4.0–10.5)
nRBC: 0 % (ref 0.0–0.2)

## 2021-10-16 LAB — RENAL FUNCTION PANEL
Albumin: 2.3 g/dL — ABNORMAL LOW (ref 3.5–5.0)
Anion gap: 6 (ref 5–15)
BUN: 74 mg/dL — ABNORMAL HIGH (ref 8–23)
CO2: 28 mmol/L (ref 22–32)
Calcium: 8.5 mg/dL — ABNORMAL LOW (ref 8.9–10.3)
Chloride: 115 mmol/L — ABNORMAL HIGH (ref 98–111)
Creatinine, Ser: 2.19 mg/dL — ABNORMAL HIGH (ref 0.61–1.24)
GFR, Estimated: 33 mL/min — ABNORMAL LOW (ref >60)
Glucose, Bld: 98 mg/dL (ref 70–99)
Phosphorus: 3.1 mg/dL (ref 2.5–4.6)
Potassium: 3.2 mmol/L — ABNORMAL LOW (ref 3.5–5.1)
Sodium: 149 mmol/L — ABNORMAL HIGH (ref 135–145)

## 2021-10-16 LAB — BLOOD GAS, ARTERIAL
Acid-Base Excess: 5 mmol/L — ABNORMAL HIGH (ref 0.0–2.0)
Bicarbonate: 29.9 mmol/L — ABNORMAL HIGH (ref 20.0–28.0)
FIO2: 28 %
O2 Saturation: 93.3 %
Patient temperature: 37
pCO2 arterial: 44 mmHg (ref 32–48)
pH, Arterial: 7.44 (ref 7.35–7.45)
pO2, Arterial: 64 mmHg — ABNORMAL LOW (ref 83–108)

## 2021-10-16 LAB — GLUCOSE, CAPILLARY
Glucose-Capillary: 89 mg/dL (ref 70–99)
Glucose-Capillary: 97 mg/dL (ref 70–99)
Glucose-Capillary: 89 mg/dL (ref 70–99)
Glucose-Capillary: 102 mg/dL — ABNORMAL HIGH (ref 70–99)
Glucose-Capillary: 100 mg/dL — ABNORMAL HIGH (ref 70–99)
Glucose-Capillary: 104 mg/dL — ABNORMAL HIGH (ref 70–99)
Glucose-Capillary: 107 mg/dL — ABNORMAL HIGH (ref 70–99)

## 2021-10-16 LAB — MAGNESIUM: Magnesium: 1.8 mg/dL (ref 1.7–2.4)

## 2021-10-16 MED ORDER — FREE WATER
100.0000 mL | Status: DC
  Administered 2021-10-16 – 2021-10-17 (×5): 100 mL

## 2021-10-16 MED ORDER — MAGNESIUM SULFATE 2 GM/50ML IV SOLN
2.0000 g | Freq: Once | INTRAVENOUS | Status: AC
Start: 2021-10-16 — End: 2021-10-17
  Administered 2021-10-16: 2 g via INTRAVENOUS
  Filled 2021-10-16: qty 50

## 2021-10-16 MED ORDER — HEPARIN SODIUM (PORCINE) 5000 UNIT/ML IJ SOLN
5000.0000 [IU] | Freq: Three times a day (TID) | INTRAMUSCULAR | Status: DC
  Administered 2021-10-16 – 2021-10-23 (×20): 5000 [IU] via SUBCUTANEOUS
  Filled 2021-10-16 (×20): qty 1

## 2021-10-16 MED ORDER — POTASSIUM CHLORIDE 20 MEQ PO PACK
20.0000 meq | PACK | Freq: Once | ORAL | Status: AC
  Administered 2021-10-16: 20 meq
  Filled 2021-10-16: qty 1

## 2021-10-16 MED ORDER — OXYCODONE HCL 5 MG/5ML PO SOLN
10.0000 mg | Freq: Three times a day (TID) | ORAL | Status: DC | PRN
  Administered 2021-10-22 – 2021-11-04 (×15): 10 mg
  Filled 2021-10-16 (×17): qty 10

## 2021-10-16 MED ORDER — PANTOPRAZOLE 2 MG/ML SUSPENSION
40.0000 mg | Freq: Two times a day (BID) | ORAL | Status: DC
  Administered 2021-10-16 – 2021-11-04 (×30): 40 mg
  Filled 2021-10-16 (×43): qty 20

## 2021-10-16 MED ORDER — PROSOURCE TF PO LIQD
45.0000 mL | Freq: Four times a day (QID) | ORAL | Status: DC
  Administered 2021-10-16 – 2021-10-22 (×19): 45 mL
  Filled 2021-10-16 (×33): qty 45

## 2021-10-16 NOTE — Evaluation (Signed)
Occupational Therapy Evaluation Patient Details Name: Gary [name] MRN: [id number] DOB: Sep 01, 1955 Today's Date: 10/16/2021   History of Present Illness Patient was admitted 2/2 SOB, COPD exacerbation, acute metabolic encephalopathy, and acute on chronic hypoxic and hypercapnic respiratory failure in setting of acute decompensated HFpEF and AECOPD requiring intubation and mechanical ventilation. The pt is now s/p tracheostomy and PEG tube placement.   Clinical Impression   Pt was seen for SLP/OT evaluation this date. Prior to hospital admission, per pt chart, pt was ambulatory with [facility] and needs assist with bathing and IADLs. Pt lives with spouse in an apartment with level entry. Pt presents to acute OT demonstrating impaired ADL performance and functional mobility 2/2 decreased activity tolerance and functional strength/ROM/balance deficits. Pt A&O x3, pt demonstrated decreased awareness throughout session (unclear cognition vs communication), but no family/caregiver present to determine cognitive baseline.   Pt currently requires MOD A for rolling bed level toward left side, MAX A +2 with bed deflated for supine<>sit, and required single UE to maintain balance sitting EOB throughout session - able to tolerate sitting ~25 minutes. MAX A for donning/doffing socks while seated. MOD A with vcs for initiation for performing oral care with suction. SUPERVISION/SETUP with min vcs for technique for clearing secretions with yankauer. MIN A with tactile cueing for holding phone in place while talking on the phone seated EOB - pt unable to dual task. Pt left in bed with all needs met and SLP present to trial PMV, oxygen remained 95% throughout session, increased secretions with movement. Pt would benefit from skilled OT to address noted impairments and functional limitations (see below for any additional details). Upon hospital discharge, recommend STR to maximize pt safety and return to PLOF.       Recommendations for follow up therapy are one component of a multi-disciplinary discharge planning process, led by the attending physician.  Recommendations may be updated based on patient status, additional functional criteria and insurance authorization.   Follow Up Recommendations  Skilled nursing-short term rehab (<3 hours/day)    Assistance Recommended at Discharge Frequent or constant Supervision/Assistance  Patient can return home with the following Two people to help with walking and/or transfers;A lot of help with bathing/dressing/bathroom;Assistance with cooking/housework;Direct supervision/assist for medications management;Assist for transportation    Functional Status Assessment  Patient has had a recent decline in their functional status and demonstrates the ability to make significant improvements in function in a reasonable and predictable amount of time.  Equipment Recommendations  Other (comment) (defer next venue of care)    Recommendations for Other Services       Precautions / Restrictions Precautions Precautions: Fall Precaution Comments: IJ, G tube, trach Restrictions Weight Bearing Restrictions: No      Mobility Bed Mobility Overal bed mobility: Needs Assistance Bed Mobility: Supine to Sit, Sit to Supine, Rolling Rolling: Mod assist (vcs for initiation)   Supine to sit: Max assist, +2 for physical assistance Sit to supine: Max assist, +2 for physical assistance   General bed mobility comments: deflate bed    Transfers                          Balance Overall balance assessment: Needs assistance Sitting-balance support: Feet supported, No upper extremity supported Sitting balance-[name] Scale: Fair Sitting balance - Comments: Pt requires single UE to maintain balance throughout session  ADL either performed or assessed with clinical judgement   ADL Overall ADL's : Needs  assistance/impaired                                       General ADL Comments: MAX A for donning/doffing socks while seated. MOD A with vcs for initiation for performing oral care with suction. SUPERVISION/SETUP with min vcs for technique for clearing secretions with yankauer. MIN A with tactile cueing for holding phone in place while talking on the phone seated EOB - pt unable to dual task.      Pertinent Vitals/Pain Pain Assessment Pain Assessment: Faces Faces Pain Scale: No hurt     Hand Dominance     Extremity/Trunk Assessment Upper Extremity Assessment Upper Extremity Assessment: Generalized weakness   Lower Extremity Assessment Lower Extremity Assessment: Generalized weakness       Communication Communication Communication: Tracheostomy;Passy-Muir valve   Cognition Arousal/Alertness: Awake/alert Behavior During Therapy: WFL for tasks assessed/performed Overall Cognitive Status: No family/caregiver present to determine baseline cognitive functioning Area of Impairment: Orientation, Awareness, Safety/judgement                 Orientation Level: Disoriented to, Situation       Safety/Judgement: Decreased awareness of safety, Decreased awareness of deficits Awareness: Intellectual   General Comments: Pt A&O x3, unclear cognition vs communication                Home Living Family/patient expects to be discharged to:: Private residence Living Arrangements: Spouse/significant other Available Help at Discharge: Family Type of Home: Apartment Home Access: Level entry     Home Layout: One level     Bathroom Shower/Tub: Tub/shower unit         Home Equipment: Cane - single point   Additional Comments: Information obtained from pt chart      Prior Functioning/Environment Prior Level of Function : Needs assist       Physical Assist : ADLs (physical)   ADLs (physical): Bathing;IADLs Mobility Comments: Pt reports he's  ambulatory with SPC          OT Problem List: Decreased strength;Decreased range of motion;Decreased activity tolerance;Impaired balance (sitting and/or standing);Decreased cognition;Decreased safety awareness      OT Treatment/Interventions: Self-care/ADL training;Therapeutic exercise;Energy conservation;DME and/or AE instruction;Therapeutic activities;Patient/family education;Balance training    OT Goals(Current goals can be found in the care plan section) Acute Rehab OT Goals Patient Stated Goal: to return to PLOF OT Goal Formulation: With patient Time For Goal Achievement: 10/30/21 Potential to Achieve Goals: Fair ADL Goals Pt Will Perform Grooming: with set-up;sitting Pt Will Perform Lower Body Dressing: with min assist;with adaptive equipment;sitting/lateral leans Pt Will Transfer to Toilet: with min assist;stand pivot transfer;bedside commode  OT Frequency: Min 2X/week    Co-evaluation PT/OT/SLP Co-Evaluation/Treatment: Yes Reason for Co-Treatment: Complexity of the patient's impairments (multi-system involvement);Necessary to address cognition/behavior during functional activity;For patient/therapist safety   OT goals addressed during session: ADL's and self-care SLP goals addressed during session: Communication    AM-PAC OT "6 Clicks" Daily Activity     Outcome Measure Help from another person eating meals?: A Little Help from another person taking care of personal grooming?: A Lot Help from another person toileting, which includes using toliet, bedpan, or urinal?: A Lot Help from another person bathing (including washing, rinsing, drying)?: A Lot Help from another person to put on and taking off regular upper body clothing?:  A Little Help from another person to put on and taking off regular lower body clothing?: A Lot 6 Click Score: 14   End of Session Nurse Communication: Mobility status  Activity Tolerance: Patient tolerated treatment well Patient left: in  bed;with bed alarm set;with call bell/phone within reach;Other (comment) (left in bed with SLP present)  OT Visit Diagnosis: Other abnormalities of gait and mobility (R26.89)                Time: 0397-9536 (+ 1021-1030) OT Time Calculation (min): 54 min Charges:  OT General Charges $OT Visit: 1 Visit OT Evaluation $OT Eval High Complexity: 1 High OT Treatments $Self Care/Home Management : 38-52 mins  [employer], OTDS  [employer] 10/16/2021, 1:38 PM

## 2021-10-16 NOTE — Progress Notes (Addendum)
1630 Patient pulled off his heart monitor,oxygen,and pulse oximeter He  stated he did not need them because Shaquille O'Neals kids said he didn't need them. Patient also stated Shaq's kids said no one needed college. He rambled on about on the news this morning- the patient did not watch the news- he has watched [employer] channel all day. Patient also called his nurse an 64 year old grandma who needed to get with "it". He told nurse several times his age and he was "with it". Patient was also unable to figure out how to use yankauer suction after he used it all day today. Dr. Mortimer Fries consulted and ABG drawn. ABG is within acceptable limits. J.Keene NP in to talk with patient's sister. Patient argued with NP and would not let him update his sister. 1727 After consulting with J.Keene NP, given .5 mg of Klonopin for agitation per peg tube.

## 2021-10-16 NOTE — Progress Notes (Signed)
1030 Patient covered in stool. Attempted to clean patient. Patients' stool so liquid that every time he coughed he stooled again. Discussed with NT about a rectal foley. Patient met criteria. Attempted to place flexi seal rectal foley. Pushed out by patient after insertion. Attempted to re insert rectal foley. Patient rolled over on Nurses arm and refused to roll back. Nurses arm trapped under patient. Patient argued with 2nd nurse who came to assist with turning patient for 20 minutes, while nurses' arm was trapped under patient.( Patient weighs 350 lbs.+) Finally patient agree to roll and insertion of rectal foley. Patient pushed out rectal foley when it was inserted again. Patient cleaned of stool again.

## 2021-10-16 NOTE — Progress Notes (Signed)
[facility] for Electrolyte Monitoring and Replacement   Recent Labs: Potassium (mmol/L)  Date Value  10/16/2021 3.2 (L)  04/15/2014 3.9   Magnesium (mg/dL)  Date Value  10/16/2021 1.8  08/21/2012 1.9   Calcium (mg/dL)  Date Value  10/16/2021 8.5 (L)   Calcium, Total (mg/dL)  Date Value  04/15/2014 8.3 (L)   Albumin (g/dL)  Date Value  10/16/2021 2.3 (L)  04/15/2014 2.9 (L)   Phosphorus (mg/dL)  Date Value  10/16/2021 3.1   Sodium (mmol/L)  Date Value  10/16/2021 149 (H)  03/15/2020 145 (H)  04/15/2014 140    Assessment: 66 y/o male with h/o PUD, OSA, HTN, CKD III, substance abuse, CHF, Hep C and COPD and COVID 19 (02/2021) who is admitted with COPD exacerbation, Pseudomonas pneumonia on ceftazidime, HFpEF, and AKI. Intubated and on fentanyl. Pharmacy is asked to follow and replace electrolytes while in CCU.  G-tube placed by IR 6/30  Nutrition: Tube feeds at 70 cc/hr + Prosource TID + Juven BIDM  Appears patient is experiencing renal recovery. Nephrology following, holding further dialysis at this point.  Goal of Therapy:  Electrolytes within normal limits  Plan:  --Na 149, suspect secondary to free water deficit. Defer management to PCCM --K 3.2, Kcl 20 mEq per tube x 1 per PCCM earlier this AM. Will give another Kcl 20 mEq per tube x 1 dose for total replacement of 40 mEq --Mg 1.8, magnesium sulfate 2 g IV x 1 --Follow-up electrolytes with AM labs tomorrow AM  Benita Gutter 10/16/2021 7:46 AM
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
## 2021-10-16 NOTE — Progress Notes (Signed)
PT Cancellation Note  Patient Details Name: Gary [name] MRN: [id number] DOB: 07-10-1955   Cancelled Treatment:    Reason Eval/Treat Not Completed: Other (comment): Per nursing, hold PT this date secondary to patient agitation.  Will attempt to see pt at a future date/time as medically appropriate.     Linus Salmons PT, DPT 10/16/21, 4:58 PM

## 2021-10-16 NOTE — Progress Notes (Signed)
[facility]  ROUNDING NOTE   Subjective:   Gary [name] is a 66 year old male with past medical conditions including COPD requiring 2 L nasal cannula, and congestive heart failure.  Patient presents to the emergency department with shortness of breath.  Patient has been admitted for COPD exacerbation ([place]) [J44.1] Obesity hypoventilation syndrome ([place]) [E66.2] Acute on chronic diastolic congestive heart failure ([name]) [I50.33] Acute respiratory failure with hypoxia and hypercapnia ([name]) [J96.01, J96.02] Acute on chronic respiratory failure with hypoxia and hypercapnia ([name]) [C16.60, J96.22]  Patient is known and has recently been followed by Dr. Holley Raring for acute kidney injury, resolved at his May appointment.    Update: Patient seen resting quietly Alert, able to speak with trach capped Weaned off vent, trach collar 35% Tube feeds at 36m/hr with 353mflushes every 4 hours Foley remains in place-2L output in 24 hours  Objective:  Vital signs in last 24 hours:  Temp:  [97.7 F (36.5 C)-99.3 F (37.4 C)] 98.4 F (36.9 C) (07/03 0800) Pulse Rate:  [49-97] 92 (07/03 0800) Resp:  [16-31] 28 (07/03 0800) BP: (96-139)/(64-98) 96/66 (07/03 0800) SpO2:  [95 %-100 %] 98 % (07/03 0800) FiO2 (%):  [35 %-40 %] 35 % (07/03 0800)  Weight change:  Filed Weights   10/12/21 0500 10/13/21 0500 10/14/21 0800  Weight: (!) 159 kg (!) 168 kg (!) 180 kg    Intake/Output: I/O last 3 completed shifts: In: 2213.2 [I.V.:862; Other:90; NG/GT:961.2; IV Piggyback:300] Out: 2866 [Urine:2865; Stool:1]   Intake/Output this shift:  Total I/O In: 50 [Other:50] Out: 0   Physical Exam: General: Chronically ill-appearing  Head: Normocephalic, atraumatic.   Eyes: Anicteric  Lungs:  Diminished bases, Trach collar on 35%  Heart: Regular rhythm  Abdomen:  Soft, nontender, obese, PEG tube in place  Extremities: No peripheral edema.  Neurologic: Alert, respond to simple questions  Skin: No  lesions  Access: Right IJ temp cath  GU Foley  Basic Metabolic Panel: Recent Labs  Lab 10/12/21 0614 10/13/21 0623 10/14/21 0400 10/15/21 0414 10/15/21 1954 10/16/21 0321  NA 139 142 144 143  --  149*  K 3.4* 3.2* 3.7 2.9* 3.2* 3.2*  CL 102 103 109 109  --  115*  CO2 '26 28 27 25  '$ --  28  GLUCOSE 114* 97 90 91  --  98  BUN 81* 84* 86* 73*  --  74*  CREATININE 2.60* 2.71* 2.77* 2.43*  --  2.19*  CALCIUM 9.2 9.2 8.8* 8.4*  --  8.5*  MG 1.9 1.9 1.9 1.8  --  1.8  PHOS 3.9 4.4 4.2 4.1  --  3.1     Liver Function Tests: Recent Labs  Lab 10/12/21 0614 10/13/21 0623 10/14/21 0400 10/15/21 0414 10/16/21 0321  ALBUMIN 2.6* 2.5* 2.5* 2.4* 2.3*    No results for input(s): "LIPASE", "AMYLASE" in the last 168 hours. No results for input(s): "AMMONIA" in the last 168 hours.  CBC: Recent Labs  Lab 10/12/21 0614 10/13/21 0623 10/14/21 0400 10/15/21 0414 10/16/21 0321  WBC 7.3 6.4 6.2 6.0 6.7  NEUTROABS 5.3 3.9 4.2 3.8 4.1  HGB 8.8* 9.0* 8.7* 8.7* 8.7*  HCT 30.8* 31.8* 30.7* 31.2* 31.5*  MCV 78.0* 79.9* 78.7* 80.4 81.0  PLT 273 294 275 293 297     Cardiac Enzymes: No results for input(s): "CKTOTAL", "CKMB", "CKMBINDEX", "TROPONINI" in the last 168 hours.  BNP: Invalid input(s): "POCBNP"  CBG: Recent Labs  Lab 10/15/21 1603 10/15/21 1939 10/15/21 2318 10/16/21 0319  10/16/21 0721  GLUCAP 103* 118* 98 89 97     Microbiology: Results for orders placed or performed during the hospital encounter of 09/19/21  SARS Coronavirus 2 by RT PCR (hospital order, performed in [facility] hospital lab) *cepheid single result test* Anterior Nasal Swab     Status: None   Collection Time: 09/19/21  9:57 AM   Specimen: Anterior Nasal Swab  Result Value Ref Range Status   SARS Coronavirus 2 by RT PCR NEGATIVE NEGATIVE Final    Comment: (NOTE) SARS-CoV-2 target nucleic acids are NOT DETECTED.  The SARS-CoV-2 RNA is generally detectable in upper and lower respiratory  specimens during the acute phase of infection. The lowest concentration of SARS-CoV-2 viral copies this assay can detect is 250 copies / mL. A negative result does not preclude SARS-CoV-2 infection and should not be used as the sole basis for treatment or other patient management decisions.  A negative result may occur with improper specimen collection / handling, submission of specimen other than nasopharyngeal swab, presence of viral mutation(s) within the areas targeted by this assay, and inadequate number of viral copies (<250 copies / mL). A negative result must be combined with clinical observations, patient history, and epidemiological information.  Fact Sheet for Patients:   https://www.patel.info/  Fact Sheet for Healthcare Providers: https://hall.com/  This test is not yet approved or  cleared by the [place] FDA and has been authorized for detection and/or diagnosis of SARS-CoV-2 by FDA under an Emergency Use Authorization (EUA).  This EUA will remain in effect (meaning this test can be used) for the duration of the COVID-19 declaration under Section 564(b)(1) of the Act, 21 U.S.C. section 360bbb-3(b)(1), unless the authorization is terminated or revoked sooner.  Performed at [facility], [address] [place], [place] [address]   Culture, Respiratory w Gram Stain     Status: None   Collection Time: 09/19/21 11:55 AM   Specimen: SPU  Result Value Ref Range Status   Specimen Description   Final    SPUTUM Performed at [facility], [place] [address], [place], [place] [address]    Special Requests   Final    NONE Performed at [facility], [place], [place] [address]    Gram Stain   Final    FEW WBC PRESENT, PREDOMINANTLY PMN FEW GRAM POSITIVE COCCI IN PAIRS Performed at [facility], [place] [address] [place],  [address]    Culture RARE [name]  Final    Report Status 09/24/2021 FINAL  Final   Organism ID, Bacteria [name]  Final      Susceptibility   [name] - MIC*    CEFTAZIDIME 4 SENSITIVE Sensitive     CIPROFLOXACIN <=0.25 SENSITIVE Sensitive     GENTAMICIN 2 SENSITIVE Sensitive     IMIPENEM 1 SENSITIVE Sensitive     PIP/TAZO 8 SENSITIVE Sensitive     CEFEPIME 2 SENSITIVE Sensitive     * RARE [name]  MRSA Next Gen by PCR, Nasal     Status: None   Collection Time: 09/19/21  6:10 PM   Specimen: Nasal Mucosa; Nasal Swab  Result Value Ref Range Status   MRSA by PCR Next Gen NOT DETECTED NOT DETECTED Final    Comment: (NOTE) The GeneXpert MRSA Assay (FDA approved for NASAL specimens only), is one component of a comprehensive MRSA colonization surveillance program. It is not intended to diagnose MRSA infection nor to guide or  monitor treatment for MRSA infections. Test performance is not FDA approved in patients less than 79 years old. Performed at [facility], [address] [place], [place] [address]   Gastrointestinal Panel by PCR , Stool     Status: Abnormal   Collection Time: 09/21/21 10:53 AM   Specimen: Stool  Result Value Ref Range Status   Campylobacter species NOT DETECTED NOT DETECTED Final   Plesimonas shigelloides NOT DETECTED NOT DETECTED Final   Salmonella species NOT DETECTED NOT DETECTED Final   Yersinia enterocolitica DETECTED (A) NOT DETECTED Final    Comment: RESULT CALLED TO, READ BACK BY AND VERIFIED WITH: [facility] JACKSON AT 1126 09/22/21.PMF    Vibrio species NOT DETECTED NOT DETECTED Final   Vibrio cholerae NOT DETECTED NOT DETECTED Final   Enteroaggregative E coli (EAEC) NOT DETECTED NOT DETECTED Final   Enteropathogenic E coli (EPEC) NOT DETECTED NOT DETECTED Final   Enterotoxigenic E coli (ETEC) NOT DETECTED NOT DETECTED Final   Shiga like toxin producing E coli (STEC) NOT DETECTED NOT DETECTED Final   Shigella/Enteroinvasive E coli (EIEC) NOT  DETECTED NOT DETECTED Final   Cryptosporidium NOT DETECTED NOT DETECTED Final   Cyclospora cayetanensis NOT DETECTED NOT DETECTED Final   Entamoeba histolytica NOT DETECTED NOT DETECTED Final   Giardia lamblia NOT DETECTED NOT DETECTED Final   Adenovirus F40/41 NOT DETECTED NOT DETECTED Final   Astrovirus NOT DETECTED NOT DETECTED Final   Norovirus GI/GII NOT DETECTED NOT DETECTED Final   Rotavirus A NOT DETECTED NOT DETECTED Final   Sapovirus (I, II, IV, and V) NOT DETECTED NOT DETECTED Final    Comment: Performed at [facility], [address] [place], [place] [address]  C Difficile Quick Screen w PCR reflex     Status: None   Collection Time: 09/21/21 11:30 AM   Specimen: STOOL  Result Value Ref Range Status   C Diff antigen NEGATIVE NEGATIVE Final   C Diff toxin NEGATIVE NEGATIVE Final   C Diff interpretation No C. difficile detected.  Final    Comment: Performed at [facility], [address] [place], [place] [address]  Culture, blood (Routine X 2) w Reflex to ID Panel     Status: None   Collection Time: 09/21/21 12:28 PM   Specimen: BLOOD  Result Value Ref Range Status   Specimen Description BLOOD BLOOD RIGHT HAND  Final   Special Requests   Final    BOTTLES DRAWN AEROBIC AND ANAEROBIC Blood Culture adequate volume   Culture   Final    NO GROWTH 5 DAYS Performed at [facility], [address], [place], [place] [address]    Report Status 09/26/2021 FINAL  Final  Culture, blood (Routine X 2) w Reflex to ID Panel     Status: None   Collection Time: 09/21/21  2:08 PM   Specimen: BLOOD  Result Value Ref Range Status   Specimen Description BLOOD BLOOD LEFT HAND  Final   Special Requests   Final    BOTTLES DRAWN AEROBIC AND ANAEROBIC Blood Culture adequate volume   Culture   Final    NO GROWTH 5 DAYS Performed at [facility], [address] [place], [place] [address]    Report Status 09/26/2021 FINAL  Final  Respiratory (~20  pathogens) panel by PCR     Status: None   Collection Time: 09/21/21  6:14 PM   Specimen: Nasopharyngeal Swab; Respiratory  Result Value Ref Range Status   Adenovirus NOT DETECTED  NOT DETECTED Final   Coronavirus 229E NOT DETECTED NOT DETECTED Final    Comment: (NOTE) The Coronavirus on the Respiratory Panel, DOES NOT test for the novel  Coronavirus (2019 nCoV)    Coronavirus HKU1 NOT DETECTED NOT DETECTED Final   Coronavirus NL63 NOT DETECTED NOT DETECTED Final   Coronavirus OC43 NOT DETECTED NOT DETECTED Final   Metapneumovirus NOT DETECTED NOT DETECTED Final   Rhinovirus / Enterovirus NOT DETECTED NOT DETECTED Final   Influenza A NOT DETECTED NOT DETECTED Final   Influenza B NOT DETECTED NOT DETECTED Final   Parainfluenza Virus 1 NOT DETECTED NOT DETECTED Final   Parainfluenza Virus 2 NOT DETECTED NOT DETECTED Final   Parainfluenza Virus 3 NOT DETECTED NOT DETECTED Final   Parainfluenza Virus 4 NOT DETECTED NOT DETECTED Final   Respiratory Syncytial Virus NOT DETECTED NOT DETECTED Final   Bordetella pertussis NOT DETECTED NOT DETECTED Final   Bordetella Parapertussis NOT DETECTED NOT DETECTED Final   Chlamydophila pneumoniae NOT DETECTED NOT DETECTED Final   Mycoplasma pneumoniae NOT DETECTED NOT DETECTED Final    Comment: Performed at [facility], [place] [address] [place], [place] [address]  Culture, Respiratory w Gram Stain     Status: None   Collection Time: 10/06/21 10:59 AM   Specimen: Tracheal Aspirate; Respiratory  Result Value Ref Range Status   Specimen Description   Final    TRACHEAL ASPIRATE Performed at [facility], [address] [place], [place] [address]    Special Requests   Final    NONE Performed at [facility], [address] [place], [place] [address]    Gram Stain   Final    ABUNDANT WBC PRESENT, PREDOMINANTLY PMN ABUNDANT GRAM NEGATIVE RODS Performed at [facility], [place] [address], [place], [place] [address]     Culture ABUNDANT [name]  Final   Report Status 10/09/2021 FINAL  Final   Organism ID, Bacteria [name]  Final      Susceptibility   [name] - MIC*    CEFTAZIDIME 8 SENSITIVE Sensitive     CIPROFLOXACIN <=0.25 SENSITIVE Sensitive     GENTAMICIN 2 SENSITIVE Sensitive     IMIPENEM 1 SENSITIVE Sensitive     * ABUNDANT [name]    Coagulation Studies: No results for input(s): "LABPROT", "INR" in the last 72 hours.  Urinalysis: No results for input(s): "COLORURINE", "LABSPEC", "PHURINE", "GLUCOSEU", "HGBUR", "BILIRUBINUR", "KETONESUR", "PROTEINUR", "UROBILINOGEN", "NITRITE", "LEUKOCYTESUR" in the last 72 hours.  Invalid input(s): "APPERANCEUR"    Imaging: No results found.   Medications:    sodium chloride Stopped (10/07/21 0542)   sodium chloride Stopped (10/15/21 2256)   cefTAZidime (FORTAZ)  IV 2 g (10/16/21 0950)   feeding supplement (NEPRO CARB STEADY) 40 mL/hr at 10/16/21 0700   magnesium sulfate bolus IVPB 2 g (10/16/21 1010)    atorvastatin  10 mg Per Tube Daily   budesonide (PULMICORT) nebulizer solution  0.5 mg Nebulization BID   Chlorhexidine Gluconate Cloth  6 each Topical Daily   cholecalciferol  2,000 Units Per Tube Daily   epoetin (EPOGEN/PROCRIT) injection  10,000 Units Intravenous Q M,W,F-HD   feeding supplement (PROSource TF)  90 mL Per Tube TID   heparin injection (subcutaneous)  5,000 Units Subcutaneous Q8H   heparin sodium (porcine)  2,000 Units Intravenous Q M,W,F-HD   ipratropium-albuterol  3 mL Nebulization BID   multivitamin  1 tablet Per Tube QHS   nutrition supplement (JUVEN)  1 packet Per Tube  BID BM   mouth rinse  15 mL Mouth Rinse Q2H   pantoprazole sodium  40 mg Per Tube BID   acetaminophen, acetaminophen, albuterol, alteplase, bisacodyl, clonazepam, heparin, HYDROmorphone (DILAUDID) injection, midazolam, oxyCODONE, pentafluoroprop-tetrafluoroeth, polyethylene glycol  Assessment/  Plan:  Gary [name] is a 66 y.o.  male with past medical conditions including COPD requiring 2 L nasal cannula, and congestive heart failure.  Patient presents to the emergency department with shortness of breath.  Patient has been admitted for COPD exacerbation ([place]) [J44.1] Obesity hypoventilation syndrome ([place]) [E66.2] Acute on chronic diastolic congestive heart failure ([name]) [I50.33] Acute respiratory failure with hypoxia and hypercapnia ([name]) [J96.01, J96.02] Acute on chronic respiratory failure with hypoxia and hypercapnia ([name]) [S49.67, J96.22]    Acute Kidney Injury with hyperkalemia, cause unidentified.  No IV contrast exposure and brief hypotensive event. Normal renal function 2 days prior.Renal Ultrasound shows mild right obstruction , simple cyst in  left kidney and bladder distendion.   -Patient is now making adequate urine.  Creatinine and BUN slowly improving.  We will hold dialysis at this time and continue to monitor renal recovery.  Lab Results  Component Value Date   CREATININE 2.19 (H) 10/16/2021   CREATININE 2.43 (H) 10/15/2021   CREATININE 2.77 (H) 10/14/2021    Intake/Output Summary (Last 24 hours) at 10/16/2021 1048 Last data filed at 10/16/2021 0800 Gross per 24 hour  Intake 1602.53 ml  Output 2066 ml  Net -463.47 ml    Acute respiratory failure, Pseudomonas noted in sputum on September 19, 2021. IV antibiotics completed Monitoring of respiratory status as per pulmonary/critical care. Patient has now been weaned off ventilator, currently on trach collar at 35%.  Chronic diastolic heart failure.  Echo from 08/07/2021 shows EF 60 to 65% with a mildly dilated left ventricular cavity and grade 1 diastolic dysfunction.  Anemia of critical illness, Hemoglobin remains 8.7.  EPO was ordered with dialysis treatments.  We will continue to monitor hemoglobin and determine need for subcu EPO.    LOS: [place] 7/3/202310:48 AM
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
## 2021-10-16 NOTE — Progress Notes (Signed)
Nutrition Follow Up Note   DOCUMENTATION CODES:   Morbid obesity  INTERVENTION:   Nepro '@70ml'$ /hr + ProSource TF 57m QID via tube   Free water flushes 1066mq4 hours   Regimen provides 3184kcal/day, 180g/day protein and 182160may of free water.   Cholecalciferol 2000 units daily via tube   Juven Fruit Punch BID via tube, each serving provides 95kcal and 2.5g of protein (amino acids glutamine and arginine)  Rena-vit daily via tube   NUTRITION DIAGNOSIS:   Inadequate oral intake related to inability to eat (pt sedated and ventilated) as evidenced by NPO status.  GOAL:   Provide needs based on ASPEN/SCCM guidelines -progressing with tube feeds   MONITOR:   Vent status, Labs, Weight trends, TF tolerance, Skin, I & O's  ASSESSMENT:   [age]o male with h/o PUD, OSA, HTN, CKD III, substance abuse, CHF, Hep C and COPD and COVID 19 (02/2021) who is admitted with COPD exacerbation, CHF and AKI. 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  -Pt s/p tracheostomy 6/20 -Pt s/p 68F IR G-tube 6/30  Pt currently on trach collar. G-tube in place and pt is tolerating tube feeds well. Plan is to reach goal tube feed rate today. SLP evaluation pending for PMSV. Pt is refeeding; electrolytes being replaced. Per chart, pt appears to be at his UBW. Pt -8.4L on his I & Os. Nephrology holding on HD for now. Pt with hypernatremia; will increase free water flushes.   Medications reviewed and include: vitamin D, epoetin, heparin, rena-vit, juven, protonix, fortaz  Labs reviewed: Na 149(H), K 3.2(L), creat 2.19(H), P 3.1 wnl, Mg 1.8 wnl Hgb 8.7(L), Hct 31.5(L), MCH 22.4(L), MCHC 27.6(L) Cbgs- 89, 97, 89 x 24 hrs  UOP- 2065m72my   Diet Order:    Diet Order             Diet NPO time specified  Diet effective midnight                  EDUCATION NEEDS:   No education needs have been identified at this time  Skin:  Skin Assessment: Skin Integrity Issues: Skin  Integrity Issues:: Incisions, Stage II, Other (Comment) Stage II: medial perineum Incisions: closed neck s/p trach Other: MASD to rt groin  Last BM:  7/3- type 6  Height:   Ht Readings from Last 1 Encounters:  09/28/21 '6\' 3"'$  (1.905 m)    Weight:   Wt Readings from Last 1 Encounters:  10/14/21 (!) 180 kg    Ideal Body Weight:  89 kg  BMI:  Body mass index is 49.6 kg/m.  Estimated Nutritional Needs:   Kcal:  3000-3300kcal/day  Protein:  178-225g/day  Fluid:  2.0L/day  CaseKoleen Distance RD, LDN Please refer to [facility] RD and/or RD on-call/weekend/after hours pager

## 2021-10-16 NOTE — TOC Progression Note (Signed)
Transition of Care [facility]) - Progression Note    Patient Details  Name: Gary [name] MRN: [id number] Date of Birth: 1955/11/06  Transition of Care [facility]) CM/SW [place], [place] Phone Number: 10/16/2021, 11:04 AM  Clinical Narrative:     Update: [facility] called CSW back from [phone], requested clinicals. CSW has emailed clinicals to [email or web address].   Previous note: Plan to look for Vent/trach SNF bed- there are only 3 in [place]- called and left a message for Heather Roberts in Admissions- [phone] her office number, main SNF number is [phone]. [facility] in [place]- unable to get through on Sunday will ask weekday SW to follow up - [phone].   [facility] in [place] - called and left a message for Danae Chen in admissions-[phone]  and will fax referral to [phone].    Expected Discharge Plan: Long Term Acute Care (LTAC) Barriers to Discharge: Continued Medical Work up  Expected Discharge Plan and Services Expected Discharge Plan: Long Term Acute Care (LTAC)   Discharge Planning Services: CM Consult Post Acute Care Choice: Long Term Acute Care (LTAC) Living arrangements for the past 2 months: Single Family Home                 DME Arranged: N/A DME Agency: NA       HH Arranged: NA HH Agency: NA         Social Determinants of Health (SDOH) Interventions    Readmission Risk Interventions    05/04/2021    1:26 PM 04/07/2021    1:39 PM 03/12/2021   11:54 AM  Readmission Risk Prevention Plan  Transportation Screening Complete Complete Complete  PCP or Specialist Appt within 3-5 Days  Complete Complete  HRI or [place]   Complete  Social Work Consult for [place] Planning/Counseling   Complete  Palliative Care Screening  Not Applicable Not Applicable  Medication Review Press photographer) Complete Complete Complete  PCP or Specialist appointment within 3-5 days of discharge Complete     HRI or [place] Complete    SW Recovery Care/Counseling Consult Complete    [place] Not Applicable

## 2021-10-16 NOTE — Progress Notes (Signed)
PHARMACIST - PHYSICIAN COMMUNICATION  CONCERNING: IV to Oral Route Change Policy  RECOMMENDATION: This patient is receiving pantoprazole by the intravenous route.  Based on criteria approved by the Pharmacy and Therapeutics Committee, the intravenous medication(s) is/are being converted to the equivalent oral dose form(s).   DESCRIPTION: These criteria include: The patient is eating (either orally or via tube) and/or has been taking other orally administered medications for a least 24 hours The patient has no evidence of active gastrointestinal bleeding or impaired GI absorption (gastrectomy, short bowel, patient on TNA or NPO).  If you have questions about this conversion, please contact the [place], [facility] 10/16/2021 7:52 AM

## 2021-10-17 LAB — CBC WITH DIFFERENTIAL/PLATELET
Abs Immature Granulocytes: 0.02 10*3/uL (ref 0.00–0.07)
Basophils Absolute: 0.1 10*3/uL (ref 0.0–0.1)
Basophils Relative: 1 %
Eosinophils Absolute: 0.4 10*3/uL (ref 0.0–0.5)
Eosinophils Relative: 6 %
HCT: 31.2 % — ABNORMAL LOW (ref 39.0–52.0)
Hemoglobin: 8.6 g/dL — ABNORMAL LOW (ref 13.0–17.0)
Immature Granulocytes: 0 %
Lymphocytes Relative: 21 %
Lymphs Abs: 1.4 10*3/uL (ref 0.7–4.0)
MCH: 22.5 pg — ABNORMAL LOW (ref 26.0–34.0)
MCHC: 27.6 g/dL — ABNORMAL LOW (ref 30.0–36.0)
MCV: 81.7 fL (ref 80.0–100.0)
Monocytes Absolute: 0.8 10*3/uL (ref 0.1–1.0)
Monocytes Relative: 13 %
Neutro Abs: 3.8 10*3/uL (ref 1.7–7.7)
Neutrophils Relative %: 59 %
Platelets: 213 10*3/uL (ref 150–400)
RBC: 3.82 MIL/uL — ABNORMAL LOW (ref 4.22–5.81)
RDW: 17.4 % — ABNORMAL HIGH (ref 11.5–15.5)
WBC: 6.5 10*3/uL (ref 4.0–10.5)
nRBC: 0 % (ref 0.0–0.2)

## 2021-10-17 LAB — GLUCOSE, CAPILLARY
Glucose-Capillary: 103 mg/dL — ABNORMAL HIGH (ref 70–99)
Glucose-Capillary: 105 mg/dL — ABNORMAL HIGH (ref 70–99)
Glucose-Capillary: 107 mg/dL — ABNORMAL HIGH (ref 70–99)
Glucose-Capillary: 110 mg/dL — ABNORMAL HIGH (ref 70–99)
Glucose-Capillary: 96 mg/dL (ref 70–99)
Glucose-Capillary: 96 mg/dL (ref 70–99)

## 2021-10-17 LAB — RENAL FUNCTION PANEL
Albumin: 2.3 g/dL — ABNORMAL LOW (ref 3.5–5.0)
Anion gap: 3 — ABNORMAL LOW (ref 5–15)
BUN: 82 mg/dL — ABNORMAL HIGH (ref 8–23)
CO2: 28 mmol/L (ref 22–32)
Calcium: 8.5 mg/dL — ABNORMAL LOW (ref 8.9–10.3)
Chloride: 120 mmol/L — ABNORMAL HIGH (ref 98–111)
Creatinine, Ser: 1.93 mg/dL — ABNORMAL HIGH (ref 0.61–1.24)
GFR, Estimated: 38 mL/min — ABNORMAL LOW (ref >60)
Glucose, Bld: 113 mg/dL — ABNORMAL HIGH (ref 70–99)
Phosphorus: 2.4 mg/dL — ABNORMAL LOW (ref 2.5–4.6)
Potassium: 3.4 mmol/L — ABNORMAL LOW (ref 3.5–5.1)
Sodium: 151 mmol/L — ABNORMAL HIGH (ref 135–145)

## 2021-10-17 LAB — MAGNESIUM: Magnesium: 2 mg/dL (ref 1.7–2.4)

## 2021-10-17 MED ORDER — MIDAZOLAM HCL 2 MG/2ML IJ SOLN
2.0000 mg | Freq: Once | INTRAMUSCULAR | Status: AC
Start: 2021-10-18 — End: 2021-10-17
  Administered 2021-10-17: 2 mg via INTRAVENOUS

## 2021-10-17 MED ORDER — ORAL CARE MOUTH RINSE
15.0000 mL | OROMUCOSAL | Status: DC | PRN
Start: 2021-10-17 — End: 2021-11-15

## 2021-10-17 MED ORDER — FREE WATER
200.0000 mL | Status: DC
  Administered 2021-10-17 – 2021-10-24 (×38): 200 mL

## 2021-10-17 MED ORDER — POTASSIUM CHLORIDE 20 MEQ PO PACK
20.0000 meq | PACK | Freq: Once | ORAL | Status: AC
Start: 2021-10-17 — End: 2021-10-17
  Administered 2021-10-17: 20 meq
  Filled 2021-10-17: qty 1

## 2021-10-17 MED ORDER — ORAL CARE MOUTH RINSE
15.0000 mL | OROMUCOSAL | Status: DC
  Administered 2021-10-18 – 2021-11-29 (×89): 15 mL via OROMUCOSAL
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
## 2021-10-17 NOTE — Progress Notes (Signed)
Order for Midline placement  received as consult.  Ultrasound to both arms - upper Lt arm has no cephalic - both basilic and brachial are between 2.75 & 3 cm in depth. Too deep for midline attempt. Rt upper arm has very small cephalic vein not suitable for attempt. Both basilic and Brachial are also between 2.75 & 3 cm deep -again too deep for midline attempt. RN made aware. Placed # 20, 1.75" Lt lower FA and wrapped lightly in gauze.

## 2021-10-17 NOTE — Plan of Care (Signed)
  Problem: Education: Goal: Ability to demonstrate management of disease process will improve Outcome: Progressing Goal: Ability to verbalize understanding of medication therapies will improve Outcome: Progressing Goal: Individualized Educational Video(s) Outcome: Progressing   Problem: Activity: Goal: Capacity to carry out activities will improve Outcome: Progressing   Problem: Cardiac: Goal: Ability to achieve and maintain adequate cardiopulmonary perfusion will improve Outcome: Progressing   Problem: Education: Goal: Knowledge of disease or condition will improve Outcome: Progressing Goal: Knowledge of the prescribed therapeutic regimen will improve Outcome: Progressing Goal: Individualized Educational Video(s) Outcome: Progressing   Problem: Activity: Goal: Ability to tolerate increased activity will improve Outcome: Progressing Goal: Will verbalize the importance of balancing activity with adequate rest periods Outcome: Progressing   Problem: Respiratory: Goal: Ability to maintain a clear airway will improve Outcome: Progressing Goal: Levels of oxygenation will improve Outcome: Progressing Goal: Ability to maintain adequate ventilation will improve Outcome: Progressing   Problem: Education: Goal: Knowledge of General Education information will improve Description: Including pain rating scale, medication(s)/side effects and non-pharmacologic comfort measures Outcome: Progressing   Problem: Health Behavior/Discharge Planning: Goal: Ability to manage health-related needs will improve Outcome: Progressing   Problem: Clinical Measurements: Goal: Ability to maintain clinical measurements within normal limits will improve Outcome: Progressing Goal: Will remain free from infection Outcome: Progressing Goal: Diagnostic test results will improve Outcome: Progressing Goal: Respiratory complications will improve Outcome: Progressing Goal: Cardiovascular complication  will be avoided Outcome: Progressing   Problem: Activity: Goal: Risk for activity intolerance will decrease Outcome: Progressing   Problem: Nutrition: Goal: Adequate nutrition will be maintained Outcome: Progressing   Problem: Coping: Goal: Level of anxiety will decrease Outcome: Progressing   Problem: Elimination: Goal: Will not experience complications related to bowel motility Outcome: Progressing Goal: Will not experience complications related to urinary retention Outcome: Progressing   Problem: Pain Managment: Goal: General experience of comfort will improve Outcome: Progressing   Problem: Safety: Goal: Ability to remain free from injury will improve Outcome: Progressing   Problem: Skin Integrity: Goal: Risk for impaired skin integrity will decrease Outcome: Progressing   Problem: Education: Goal: Ability to describe self-care measures that may prevent or decrease complications (Diabetes Survival Skills Education) will improve Outcome: Progressing Goal: Individualized Educational Video(s) Outcome: Progressing   Problem: Coping: Goal: Ability to adjust to condition or change in health will improve Outcome: Progressing   Problem: Fluid Volume: Goal: Ability to maintain a balanced intake and output will improve Outcome: Progressing   Problem: Health Behavior/Discharge Planning: Goal: Ability to identify and utilize available resources and services will improve Outcome: Progressing Goal: Ability to manage health-related needs will improve Outcome: Progressing   Problem: Metabolic: Goal: Ability to maintain appropriate glucose levels will improve Outcome: Progressing   Problem: Nutritional: Goal: Maintenance of adequate nutrition will improve Outcome: Progressing Goal: Progress toward achieving an optimal weight will improve Outcome: Progressing   Problem: Skin Integrity: Goal: Risk for impaired skin integrity will decrease Outcome: Progressing    Problem: Tissue Perfusion: Goal: Adequacy of tissue perfusion will improve Outcome: Progressing   

## 2021-10-17 NOTE — Progress Notes (Signed)
[facility] for Electrolyte Monitoring and Replacement   Recent Labs: Potassium (mmol/L)  Date Value  10/17/2021 3.4 (L)  04/15/2014 3.9   Magnesium (mg/dL)  Date Value  10/17/2021 2.0  08/21/2012 1.9   Calcium (mg/dL)  Date Value  10/17/2021 8.5 (L)   Calcium, Total (mg/dL)  Date Value  04/15/2014 8.3 (L)   Albumin (g/dL)  Date Value  10/17/2021 2.3 (L)  04/15/2014 2.9 (L)   Phosphorus (mg/dL)  Date Value  10/17/2021 2.4 (L)   Sodium (mmol/L)  Date Value  10/17/2021 151 (H)  03/15/2020 145 (H)  04/15/2014 140    Assessment: 66 y/o male with h/o PUD, OSA, HTN, CKD III, substance abuse, CHF, Hep C and COPD and COVID 19 (02/2021) who is admitted with COPD exacerbation, Pseudomonas pneumonia on ceftazidime, HFpEF, and AKI. Intubated and on fentanyl. Pharmacy is asked to follow and replace electrolytes while in CCU.  G-tube placed by IR 6/30  Nutrition: Tube feeds at 70 cc/hr + Prosource TID + Juven BIDM  Appears patient is experiencing renal recovery. Nephrology following, holding further dialysis at this point.  Goal of Therapy:  Electrolytes within normal limits  Plan:  --hypernatremia: increasing free water to 200 ml per tube q4h --Kcl 20 mEq per tube x 1  --Follow-up electrolytes with AM labs tomorrow AM  Dallie Piles 10/17/2021 7:21 AM

## 2021-10-17 NOTE — Progress Notes (Signed)
[facility]  ROUNDING NOTE   Subjective:   Gary [name] is a 66 year old male with past medical conditions including COPD requiring 2 L nasal cannula, and congestive heart failure.  Patient presents to the emergency department with shortness of breath.  Patient has been admitted for COPD exacerbation ([place]) [J44.1] Obesity hypoventilation syndrome ([place]) [E66.2] Acute on chronic diastolic congestive heart failure ([name]) [I50.33] Acute respiratory failure with hypoxia and hypercapnia ([name]) [J96.01, J96.02] Acute on chronic respiratory failure with hypoxia and hypercapnia ([name]) [H47.65, J96.22]  Patient is known and has recently been followed by Dr. Holley Raring for acute kidney injury, resolved at his May appointment.    Update: Patient seen and evaluated at bedside.  Speaking valve in place.  In good spirits.  Cr trending down.  07/03 0701 - 07/04 0700 In: 4650 [I.V.:100; PT/WS:5681; IV Piggyback:200] Out: 2751 [Urine:1725] Lab Results  Component Value Date   CREATININE 1.93 (H) 10/17/2021   CREATININE 2.19 (H) 10/16/2021   CREATININE 2.43 (H) 10/15/2021     Objective:  Vital signs in last 24 hours:  Temp:  [98 F (36.7 C)-98.8 F (37.1 C)] 98.4 F (36.9 C) (07/04 0730) Pulse Rate:  [87-94] 89 (07/04 0900) Resp:  [16-32] 22 (07/04 0900) BP: (99-139)/(67-94) 121/80 (07/04 0900) SpO2:  [92 %-100 %] 94 % (07/04 0900) FiO2 (%):  [28 %-35 %] 35 % (07/04 0900)  Weight change:  Filed Weights   10/12/21 0500 10/13/21 0500 10/14/21 0800  Weight: (!) 159 kg (!) 168 kg (!) 180 kg    Intake/Output: I/O last 3 completed shifts: In: 2498.4 [I.V.:193.9; Other:150; NG/GT:1854.5; IV Piggyback:300] Out: 2825 [Urine:2825]   Intake/Output this shift:  Total I/O In: 140 [NG/GT:140] Out: 800 [Urine:800]  Physical Exam: General: Chronically ill-appearing  Head: Normocephalic, atraumatic.   Eyes: Anicteric  Lungs:  Diminished bases, Trach collar on 35%  Heart: Regular  rhythm  Abdomen:  Soft, nontender, obese, PEG tube in place  Extremities: No peripheral edema.  Neurologic: Alert, respond to simple questions  Skin: No lesions  Access: Right IJ temp cath  GU Foley  Basic Metabolic Panel: Recent Labs  Lab 10/13/21 0623 10/14/21 0400 10/15/21 0414 10/15/21 1954 10/16/21 0321 10/17/21 0418  NA 142 144 143  --  149* 151*  K 3.2* 3.7 2.9* 3.2* 3.2* 3.4*  CL 103 109 109  --  115* 120*  CO2 '28 27 25  '$ --  28 28  GLUCOSE 97 90 91  --  98 113*  BUN 84* 86* 73*  --  74* 82*  CREATININE 2.71* 2.77* 2.43*  --  2.19* 1.93*  CALCIUM 9.2 8.8* 8.4*  --  8.5* 8.5*  MG 1.9 1.9 1.8  --  1.8 2.0  PHOS 4.4 4.2 4.1  --  3.1 2.4*     Liver Function Tests: Recent Labs  Lab 10/13/21 0623 10/14/21 0400 10/15/21 0414 10/16/21 0321 10/17/21 0418  ALBUMIN 2.5* 2.5* 2.4* 2.3* 2.3*    No results for input(s): "LIPASE", "AMYLASE" in the last 168 hours. No results for input(s): "AMMONIA" in the last 168 hours.  CBC: Recent Labs  Lab 10/13/21 0623 10/14/21 0400 10/15/21 0414 10/16/21 0321 10/17/21 0418  WBC 6.4 6.2 6.0 6.7 6.5  NEUTROABS 3.9 4.2 3.8 4.1 3.8  HGB 9.0* 8.7* 8.7* 8.7* 8.6*  HCT 31.8* 30.7* 31.2* 31.5* 31.2*  MCV 79.9* 78.7* 80.4 81.0 81.7  PLT 294 275 293 297 213     Cardiac Enzymes: No results for input(s): "CKTOTAL", "CKMB", "CKMBINDEX", "  TROPONINI" in the last 168 hours.  BNP: Invalid input(s): "POCBNP"  CBG: Recent Labs  Lab 10/16/21 1951 10/16/21 2327 10/17/21 0320 10/17/21 0809 10/17/21 1212  GLUCAP 104* 107* 107* 105* 110*     Microbiology: Results for orders placed or performed during the hospital encounter of 09/19/21  SARS Coronavirus 2 by RT PCR (hospital order, performed in [facility] hospital lab) *cepheid single result test* Anterior Nasal Swab     Status: None   Collection Time: 09/19/21  9:57 AM   Specimen: Anterior Nasal Swab  Result Value Ref Range Status   SARS Coronavirus 2 by RT PCR NEGATIVE  NEGATIVE Final    Comment: (NOTE) SARS-CoV-2 target nucleic acids are NOT DETECTED.  The SARS-CoV-2 RNA is generally detectable in upper and lower respiratory specimens during the acute phase of infection. The lowest concentration of SARS-CoV-2 viral copies this assay can detect is 250 copies / mL. A negative result does not preclude SARS-CoV-2 infection and should not be used as the sole basis for treatment or other patient management decisions.  A negative result may occur with improper specimen collection / handling, submission of specimen other than nasopharyngeal swab, presence of viral mutation(s) within the areas targeted by this assay, and inadequate number of viral copies (<250 copies / mL). A negative result must be combined with clinical observations, patient history, and epidemiological information.  Fact Sheet for Patients:   https://www.patel.info/  Fact Sheet for Healthcare Providers: https://hall.com/  This test is not yet approved or  cleared by the [place] FDA and has been authorized for detection and/or diagnosis of SARS-CoV-2 by FDA under an Emergency Use Authorization (EUA).  This EUA will remain in effect (meaning this test can be used) for the duration of the COVID-19 declaration under Section 564(b)(1) of the Act, 21 U.S.C. section 360bbb-3(b)(1), unless the authorization is terminated or revoked sooner.  Performed at [facility], [address] [place], [place] [address]   Culture, Respiratory w Gram Stain     Status: None   Collection Time: 09/19/21 11:55 AM   Specimen: SPU  Result Value Ref Range Status   Specimen Description   Final    SPUTUM Performed at [facility], [place] [address] [place], [place] [address]    Special Requests   Final    NONE Performed at [facility], [place], [place] [address]    Gram Stain   Final    FEW WBC PRESENT,  PREDOMINANTLY PMN FEW GRAM POSITIVE COCCI IN PAIRS Performed at [facility], [place] [address] [place], [place] [address]    Culture RARE [name]  Final   Report Status 09/24/2021 FINAL  Final   Organism ID, Bacteria [name]  Final      Susceptibility   [name] - MIC*    CEFTAZIDIME 4 SENSITIVE Sensitive     CIPROFLOXACIN <=0.25 SENSITIVE Sensitive     GENTAMICIN 2 SENSITIVE Sensitive     IMIPENEM 1 SENSITIVE Sensitive     PIP/TAZO 8 SENSITIVE Sensitive     CEFEPIME 2 SENSITIVE Sensitive     * RARE [name]  MRSA Next Gen by PCR, Nasal     Status: None   Collection Time: 09/19/21  6:10 PM   Specimen: Nasal Mucosa; Nasal Swab  Result Value Ref Range Status   MRSA by PCR Next Gen NOT DETECTED NOT DETECTED Final    Comment: (NOTE) The GeneXpert MRSA Assay (FDA approved for  NASAL specimens only), is one component of a comprehensive MRSA colonization surveillance program. It is not intended to diagnose MRSA infection nor to guide or monitor treatment for MRSA infections. Test performance is not FDA approved in patients less than 96 years old. Performed at [facility], [address] [place], [place] [address]   Gastrointestinal Panel by PCR , Stool     Status: Abnormal   Collection Time: 09/21/21 10:53 AM   Specimen: Stool  Result Value Ref Range Status   Campylobacter species NOT DETECTED NOT DETECTED Final   Plesimonas shigelloides NOT DETECTED NOT DETECTED Final   Salmonella species NOT DETECTED NOT DETECTED Final   Yersinia enterocolitica DETECTED (A) NOT DETECTED Final    Comment: RESULT CALLED TO, READ BACK BY AND VERIFIED WITH: [facility] AT 1126 09/22/21.PMF    Vibrio species NOT DETECTED NOT DETECTED Final   Vibrio cholerae NOT DETECTED NOT DETECTED Final   Enteroaggregative E coli (EAEC) NOT DETECTED NOT DETECTED Final   Enteropathogenic E coli (EPEC) NOT DETECTED NOT DETECTED Final    Enterotoxigenic E coli (ETEC) NOT DETECTED NOT DETECTED Final   Shiga like toxin producing E coli (STEC) NOT DETECTED NOT DETECTED Final   Shigella/Enteroinvasive E coli (EIEC) NOT DETECTED NOT DETECTED Final   Cryptosporidium NOT DETECTED NOT DETECTED Final   Cyclospora cayetanensis NOT DETECTED NOT DETECTED Final   Entamoeba histolytica NOT DETECTED NOT DETECTED Final   Giardia lamblia NOT DETECTED NOT DETECTED Final   Adenovirus F40/41 NOT DETECTED NOT DETECTED Final   Astrovirus NOT DETECTED NOT DETECTED Final   Norovirus GI/GII NOT DETECTED NOT DETECTED Final   Rotavirus A NOT DETECTED NOT DETECTED Final   Sapovirus (I, II, IV, and V) NOT DETECTED NOT DETECTED Final    Comment: Performed at [facility], [address] [place], [place] [address]  C Difficile Quick Screen w PCR reflex     Status: None   Collection Time: 09/21/21 11:30 AM   Specimen: STOOL  Result Value Ref Range Status   C Diff antigen NEGATIVE NEGATIVE Final   C Diff toxin NEGATIVE NEGATIVE Final   C Diff interpretation No C. difficile detected.  Final    Comment: Performed at [facility], [address] [place], [place] [address]  Culture, blood (Routine X 2) w Reflex to ID Panel     Status: None   Collection Time: 09/21/21 12:28 PM   Specimen: BLOOD  Result Value Ref Range Status   Specimen Description BLOOD BLOOD RIGHT HAND  Final   Special Requests   Final    BOTTLES DRAWN AEROBIC AND ANAEROBIC Blood Culture adequate volume   Culture   Final    NO GROWTH 5 DAYS Performed at [facility], [address], [place], [place] [address]    Report Status 09/26/2021 FINAL  Final  Culture, blood (Routine X 2) w Reflex to ID Panel     Status: None   Collection Time: 09/21/21  2:08 PM   Specimen: BLOOD  Result Value Ref Range Status   Specimen Description BLOOD BLOOD LEFT HAND  Final   Special Requests   Final    BOTTLES DRAWN AEROBIC AND ANAEROBIC Blood Culture adequate volume    Culture   Final    NO GROWTH 5 DAYS Performed at [facility], [address], [place], [place] [address]    Report Status 09/26/2021 FINAL  Final  Respiratory (~20 pathogens) panel by PCR     Status: None  Collection Time: 09/21/21  6:14 PM   Specimen: Nasopharyngeal Swab; Respiratory  Result Value Ref Range Status   Adenovirus NOT DETECTED NOT DETECTED Final   Coronavirus 229E NOT DETECTED NOT DETECTED Final    Comment: (NOTE) The Coronavirus on the Respiratory Panel, DOES NOT test for the novel  Coronavirus (2019 nCoV)    Coronavirus HKU1 NOT DETECTED NOT DETECTED Final   Coronavirus NL63 NOT DETECTED NOT DETECTED Final   Coronavirus OC43 NOT DETECTED NOT DETECTED Final   Metapneumovirus NOT DETECTED NOT DETECTED Final   Rhinovirus / Enterovirus NOT DETECTED NOT DETECTED Final   Influenza A NOT DETECTED NOT DETECTED Final   Influenza B NOT DETECTED NOT DETECTED Final   Parainfluenza Virus 1 NOT DETECTED NOT DETECTED Final   Parainfluenza Virus 2 NOT DETECTED NOT DETECTED Final   Parainfluenza Virus 3 NOT DETECTED NOT DETECTED Final   Parainfluenza Virus 4 NOT DETECTED NOT DETECTED Final   Respiratory Syncytial Virus NOT DETECTED NOT DETECTED Final   Bordetella pertussis NOT DETECTED NOT DETECTED Final   Bordetella Parapertussis NOT DETECTED NOT DETECTED Final   Chlamydophila pneumoniae NOT DETECTED NOT DETECTED Final   Mycoplasma pneumoniae NOT DETECTED NOT DETECTED Final    Comment: Performed at [facility], [address] [place], [place] [address]  Culture, Respiratory w Gram Stain     Status: None   Collection Time: 10/06/21 10:59 AM   Specimen: Tracheal Aspirate; Respiratory  Result Value Ref Range Status   Specimen Description   Final    TRACHEAL ASPIRATE Performed at [facility], [address], [place], [place] [address]    Special Requests   Final    NONE Performed at [facility], [address] [place], [place]  [address]    Gram Stain   Final    ABUNDANT WBC PRESENT, PREDOMINANTLY PMN ABUNDANT GRAM NEGATIVE RODS Performed at [facility], [place] [address] [place],  [address]    Culture ABUNDANT [name]  Final   Report Status 10/09/2021 FINAL  Final   Organism ID, Bacteria [name]  Final      Susceptibility   [name] - MIC*    CEFTAZIDIME 8 SENSITIVE Sensitive     CIPROFLOXACIN <=0.25 SENSITIVE Sensitive     GENTAMICIN 2 SENSITIVE Sensitive     IMIPENEM 1 SENSITIVE Sensitive     * ABUNDANT [name]    Coagulation Studies: No results for input(s): "LABPROT", "INR" in the last 72 hours.  Urinalysis: No results for input(s): "COLORURINE", "LABSPEC", "PHURINE", "GLUCOSEU", "HGBUR", "BILIRUBINUR", "KETONESUR", "PROTEINUR", "UROBILINOGEN", "NITRITE", "LEUKOCYTESUR" in the last 72 hours.  Invalid input(s): "APPERANCEUR"    Imaging: No results found.   Medications:    sodium chloride Stopped (10/07/21 0542)   feeding supplement (NEPRO CARB STEADY) 1,000 mL (10/17/21 1339)    atorvastatin  10 mg Per Tube Daily   budesonide (PULMICORT) nebulizer solution  0.5 mg Nebulization BID   Chlorhexidine Gluconate Cloth  6 each Topical Daily   cholecalciferol  2,000 Units Per Tube Daily   epoetin (EPOGEN/PROCRIT) injection  10,000 Units Intravenous Q M,W,F-HD   feeding supplement (PROSource TF)  45 mL Per Tube QID   free water  200 mL Per Tube Q4H   heparin injection (subcutaneous)  5,000 Units Subcutaneous Q8H   heparin sodium (porcine)  2,000 Units Intravenous Q M,W,F-HD   ipratropium-albuterol  3 mL Nebulization BID   multivitamin  1 tablet Per Tube QHS   nutrition supplement (JUVEN)  1 packet Per Tube BID BM   mouth rinse  15 mL Mouth Rinse Q2H   pantoprazole sodium  40 mg Per Tube BID   acetaminophen, acetaminophen, albuterol, alteplase, bisacodyl, clonazepam, heparin, HYDROmorphone (DILAUDID) injection, midazolam,  oxyCODONE, pentafluoroprop-tetrafluoroeth, polyethylene glycol  Assessment/ Plan:  Gary [name] is a 66 y.o.  male with past medical conditions including COPD requiring 2 L nasal cannula, and congestive heart failure.  Patient presents to the emergency department with shortness of breath.  Patient has been admitted for COPD exacerbation ([place]) [J44.1] Obesity hypoventilation syndrome ([place]) [E66.2] Acute on chronic diastolic congestive heart failure ([name]) [I50.33] Acute respiratory failure with hypoxia and hypercapnia ([name]) [J96.01, J96.02] Acute on chronic respiratory failure with hypoxia and hypercapnia ([name]) [F68.12, J96.22]    Acute Kidney Injury with hyperkalemia, cause unidentified.  No IV contrast exposure and brief hypotensive event. Normal renal function 2 days prior.Renal Ultrasound shows mild right obstruction , simple cyst in  left kidney and bladder distendion.   -Cr down to 1.9, good UOP noted, d/c dialysis catheter at this time, monitor renal parameters daily.   Lab Results  Component Value Date   CREATININE 1.93 (H) 10/17/2021   CREATININE 2.19 (H) 10/16/2021   CREATININE 2.43 (H) 10/15/2021    Intake/Output Summary (Last 24 hours) at 10/17/2021 1345 Last data filed at 10/17/2021 1138 Gross per 24 hour  Intake 1914 ml  Output 2075 ml  Net -161 ml    Acute respiratory failure, Pseudomonas noted in sputum on September 19, 2021. IV antibiotics completed Monitoring of respiratory status as per pulmonary/critical care. -  Doing well ventilator,  speaking valve in place today..  Chronic diastolic heart failure.  Echo from 08/07/2021 shows EF 60 to 65% with a mildly dilated left ventricular cavity and grade 1 diastolic dysfunction. -  Good volume status.   Anemia of critical illness, Hemoglobin 8.6 at the moment, consider epogen as outpt.     LOS: 28 Stephan Nelis 7/4/20231:45 PM
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
## 2021-10-18 DIAGNOSIS — J9621 Acute and chronic respiratory failure with hypoxia: Secondary | ICD-10-CM | POA: Diagnosis not present

## 2021-10-18 DIAGNOSIS — J9622 Acute and chronic respiratory failure with hypercapnia: Secondary | ICD-10-CM | POA: Diagnosis not present

## 2021-10-18 LAB — RENAL FUNCTION PANEL
Albumin: 2.3 g/dL — ABNORMAL LOW (ref 3.5–5.0)
Anion gap: 4 — ABNORMAL LOW (ref 5–15)
BUN: 69 mg/dL — ABNORMAL HIGH (ref 8–23)
CO2: 29 mmol/L (ref 22–32)
Calcium: 8.5 mg/dL — ABNORMAL LOW (ref 8.9–10.3)
Chloride: 116 mmol/L — ABNORMAL HIGH (ref 98–111)
Creatinine, Ser: 1.69 mg/dL — ABNORMAL HIGH (ref 0.61–1.24)
GFR, Estimated: 45 mL/min — ABNORMAL LOW (ref >60)
Glucose, Bld: 100 mg/dL — ABNORMAL HIGH (ref 70–99)
Phosphorus: 2 mg/dL — ABNORMAL LOW (ref 2.5–4.6)
Potassium: 3 mmol/L — ABNORMAL LOW (ref 3.5–5.1)
Sodium: 149 mmol/L — ABNORMAL HIGH (ref 135–145)

## 2021-10-18 LAB — CBC WITH DIFFERENTIAL/PLATELET
Abs Immature Granulocytes: 0.03 10*3/uL (ref 0.00–0.07)
Basophils Absolute: 0 10*3/uL (ref 0.0–0.1)
Basophils Relative: 1 %
Eosinophils Absolute: 0.4 10*3/uL (ref 0.0–0.5)
Eosinophils Relative: 7 %
HCT: 31.6 % — ABNORMAL LOW (ref 39.0–52.0)
Hemoglobin: 8.8 g/dL — ABNORMAL LOW (ref 13.0–17.0)
Immature Granulocytes: 1 %
Lymphocytes Relative: 29 %
Lymphs Abs: 1.8 10*3/uL (ref 0.7–4.0)
MCH: 22.8 pg — ABNORMAL LOW (ref 26.0–34.0)
MCHC: 27.8 g/dL — ABNORMAL LOW (ref 30.0–36.0)
MCV: 81.9 fL (ref 80.0–100.0)
Monocytes Absolute: 0.9 10*3/uL (ref 0.1–1.0)
Monocytes Relative: 14 %
Neutro Abs: 3.1 10*3/uL (ref 1.7–7.7)
Neutrophils Relative %: 48 %
Platelets: 262 10*3/uL (ref 150–400)
RBC: 3.86 MIL/uL — ABNORMAL LOW (ref 4.22–5.81)
RDW: 17.4 % — ABNORMAL HIGH (ref 11.5–15.5)
WBC: 6.3 10*3/uL (ref 4.0–10.5)
nRBC: 0 % (ref 0.0–0.2)

## 2021-10-18 LAB — GLUCOSE, CAPILLARY
Glucose-Capillary: 103 mg/dL — ABNORMAL HIGH (ref 70–99)
Glucose-Capillary: 104 mg/dL — ABNORMAL HIGH (ref 70–99)
Glucose-Capillary: 115 mg/dL — ABNORMAL HIGH (ref 70–99)
Glucose-Capillary: 118 mg/dL — ABNORMAL HIGH (ref 70–99)
Glucose-Capillary: 92 mg/dL (ref 70–99)
Glucose-Capillary: 98 mg/dL (ref 70–99)

## 2021-10-18 LAB — MAGNESIUM: Magnesium: 1.9 mg/dL (ref 1.7–2.4)

## 2021-10-18 MED ORDER — EPOETIN ALFA 40000 UNIT/ML IJ SOLN
20000.0000 [IU] | INTRAMUSCULAR | Status: DC
  Administered 2021-10-18: 20000 [IU] via SUBCUTANEOUS
  Filled 2021-10-18 (×2): qty 1

## 2021-10-18 MED ORDER — TRAZODONE HCL 50 MG PO TABS
50.0000 mg | ORAL_TABLET | Freq: Every day | ORAL | Status: DC
  Administered 2021-10-18 – 2021-11-17 (×30): 50 mg via ORAL
  Filled 2021-10-18 (×31): qty 1

## 2021-10-18 MED ORDER — QUETIAPINE FUMARATE 25 MG PO TABS
25.0000 mg | ORAL_TABLET | Freq: Two times a day (BID) | ORAL | Status: DC | PRN
  Administered 2021-10-18 – 2021-12-09 (×39): 25 mg via ORAL
  Filled 2021-10-18 (×41): qty 1

## 2021-10-18 MED ORDER — POTASSIUM & SODIUM PHOSPHATES 280-160-250 MG PO PACK
1.0000 | PACK | Freq: Three times a day (TID) | ORAL | Status: AC
  Administered 2021-10-18 (×4): 1
  Filled 2021-10-18 (×4): qty 1

## 2021-10-18 MED ORDER — LORAZEPAM 2 MG/ML IJ SOLN
INTRAMUSCULAR | Status: AC
  Administered 2021-10-18: 2 mg via INTRAVENOUS
  Filled 2021-10-18: qty 1

## 2021-10-18 MED ORDER — LORAZEPAM 2 MG/ML IJ SOLN: 2.0000 mg | Freq: Once | INTRAMUSCULAR | Status: AC

## 2021-10-18 MED ORDER — POTASSIUM CHLORIDE 20 MEQ PO PACK
40.0000 meq | PACK | ORAL | Status: AC
  Administered 2021-10-18 (×2): 40 meq
  Filled 2021-10-18 (×2): qty 2

## 2021-10-18 NOTE — TOC Progression Note (Signed)
Transition of Care [facility]) - Progression Note    Patient Details  Name: Gary [name] MRN: [id number] Date of Birth: [birth year]  Transition of Care [facility]) CM/SW [place], [place] Phone Number: 10/18/2021, 4:08 PM  Clinical Narrative:     Per [facility] they are not in network with patient's insurance however they sometimes will give a prior auth for out of network facilities and approve. They suggest this CSW reach out to insurance.   CSW called [facility] Medicaid at [phone] they provided information on where to find prior auth form on their website to complete and fax.   CSW has faxed  completed [employer] prior auth form to [phone].  Expected Discharge Plan: Long Term Acute Care (LTAC) Barriers to Discharge: Continued Medical Work up  Expected Discharge Plan and Services Expected Discharge Plan: Long Term Acute Care (LTAC)   Discharge Planning Services: CM Consult Post Acute Care Choice: Long Term Acute Care (LTAC) Living arrangements for the past 2 months: Single Family Home                 DME Arranged: N/A DME Agency: NA       HH Arranged: NA HH Agency: NA         Social Determinants of Health (SDOH) Interventions    Readmission Risk Interventions    05/04/2021    1:26 PM 04/07/2021    1:39 PM 03/12/2021   11:54 AM  Readmission Risk Prevention Plan  Transportation Screening Complete Complete Complete  PCP or Specialist Appt within 3-5 Days  Complete Complete  HRI or [place]   Complete  Social Work Consult for [place] Planning/Counseling   Complete  Palliative Care Screening  Not Applicable Not Applicable  Medication Review Press photographer) Complete Complete Complete  PCP or Specialist appointment within 3-5 days of discharge Complete    HRI or [place] Complete    SW Recovery Care/Counseling Consult Complete    [place] Not Applicable

## 2021-10-18 NOTE — Progress Notes (Signed)
Nutrition Follow Up Note   DOCUMENTATION CODES:   Morbid obesity  INTERVENTION:   Continue Nepro _0 /hr + ProSource TF 1m QID via tube   Free water flushes 2028mq4 hours   Regimen provides 3184kcal/day, 180g/day protein and [id number] of free water.   Cholecalciferol 2000 units daily via tube   Juven Fruit Punch BID via tube, each serving provides 95kcal and 2.5g of protein (amino acids glutamine and arginine)  Rena-vit daily via tube   NUTRITION DIAGNOSIS:   Inadequate oral intake related to inability to eat (pt sedated and ventilated) as evidenced by NPO status. -resolving  GOAL:   Provide needs based on ASPEN/SCCM guidelines -met with tube feeds   MONITOR:   PO intake, Supplement acceptance, Labs, Weight trends, TF tolerance, Skin, I & O's  ASSESSMENT:   [age]o male with h/o PUD, OSA, HTN, CKD III, substance abuse, CHF, Hep C and COPD and COVID 19 (02/2021) who is admitted with COPD exacerbation, CHF and AKI. 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  -Pt s/p tracheostomy 6/20 -Pt s/p 65F IR G-tube 6/30  Pt remains on trach collar. G-tube in place and pt is tolerating tube feeds well at goal rate. Pt continues to refeed; electrolytes being replaced. Pt with hypernatremia likely secondary to refeeding; free water increased by pharmacy. Pt seen by SLP yesterday and initiated on a dysphagia 1/nectar thick diet. Pt is using his PMSV. Spoke with RN, pt has been agitated and received ativan and has not really eaten much but has been drinking some liquids. Per chart, pt with many weight fluctuations since admission but appears to be down ~28lbs from his UBW if bed weights are correct. Pt -6.4L on his I & Os. Dialysis discontinued.   Medications reviewed and include: vitamin D, epoetin, heparin, rena-vit, juven, protonix, Kphos   Labs reviewed: Na 149(H), K 3.0(L), BUN 69(H), creat 1.69(H), P 2.0(L), Mg 1.9 wnl Hgb 8.8(L), Hct 31.6(L), MCH 22.8(L),  MCHC 27.8(L) Cbgs- 104, 98 x 24 hrs  UOP- [id number]   Diet Order:    Diet Order             DIET - DYS 1 Room service appropriate? Yes with Assist; Fluid consistency: Nectar Thick  Diet effective now                  EDUCATION NEEDS:   No education needs have been identified at this time  Skin:  Skin Assessment: Skin Integrity Issues: Skin Integrity Issues:: Incisions, Stage II, Other (Comment) Stage II: medial perineum Incisions: closed neck s/p trach Other: MASD to rt groin  Last BM:  7/5- type 6  Height:   Ht Readings from Last 1 Encounters:  09/28/21 _1  (1.905 m)    Weight:   Wt Readings from Last 1 Encounters:  10/18/21 (!) 169 kg    Ideal Body Weight:  89 kg  BMI:  Body mass index is 46.57 kg/m.  Estimated Nutritional Needs:   Kcal:  3000-3300kcal/day  Protein:  178-225g/day  Fluid:  2.0L/day  CaseKoleen Distance RD, LDN Please refer to [facility] RD and/or RD on-call/weekend/after hours pager

## 2021-10-18 NOTE — Progress Notes (Signed)
[facility]  ROUNDING NOTE   Subjective:   Gary [name] is a 66 year old male with past medical conditions including COPD requiring 2 L nasal cannula, and congestive heart failure.  Patient presents to the emergency department with shortness of breath.  Patient has been admitted for COPD exacerbation ([place]) [J44.1] Obesity hypoventilation syndrome ([place]) [E66.2] Acute on chronic diastolic congestive heart failure ([name]) [I50.33] Acute respiratory failure with hypoxia and hypercapnia ([name]) [J96.01, J96.02] Acute on chronic respiratory failure with hypoxia and hypercapnia ([name]) [J00.93, J96.22]  Patient is known and has recently been followed by Dr. Holley Raring for acute kidney injury, resolved at his May appointment.    Update: Patient resting comfortably Remains on trach collar, speaking valve in place Tube feeds 59m/hr  Creatinine continues to improve   07/04 0701 - 07/05 0700 In: 1840 [NG/GT:1840] Out: 1100 [Urine:1100] Lab Results  Component Value Date   CREATININE 1.69 (H) 10/18/2021   CREATININE 1.93 (H) 10/17/2021   CREATININE 2.19 (H) 10/16/2021     Objective:  Vital signs in last 24 hours:  Temp:  [97.8 F (36.6 C)-99.9 F (37.7 C)] 99.9 F (37.7 C) (07/05 0738) Pulse Rate:  [87-95] 87 (07/05 1049) Resp:  [18-35] 26 (07/05 1049) BP: (112-141)/(72-117) 114/81 (07/05 1047) SpO2:  [88 %-99 %] 97 % (07/05 1049) FiO2 (%):  [28 %-35 %] 35 % (07/05 0758) Weight:  [169 kg] 169 kg (07/05 0330)  Weight change:  Filed Weights   10/13/21 0500 10/14/21 0800 10/18/21 0330  Weight: (!) 168 kg (!) 180 kg (!) 169 kg    Intake/Output: I/O last 3 completed shifts: In: 2768.5 [NG/GT:2668.5; IV Piggyback:100] Out: 1925 [Urine:1925]   Intake/Output this shift:  Total I/O In: 1655.5 [NG/GT:1655.5] Out: 0   Physical Exam: General: Chronically ill-appearing  Head: Normocephalic, atraumatic.   Eyes: Anicteric  Lungs:  Diminished bases, Trach collar on 35%   Heart: Regular rhythm  Abdomen:  Soft, nontender, obese, PEG tube in place  Extremities: No peripheral edema.  Neurologic: Alert, respond to simple questions  Skin: No lesions  Access: None    Basic Metabolic Panel: Recent Labs  Lab 10/14/21 0400 10/15/21 0414 10/15/21 1954 10/16/21 0321 10/17/21 0418 10/18/21 0456  NA 144 143  --  149* 151* 149*  K 3.7 2.9* 3.2* 3.2* 3.4* 3.0*  CL 109 109  --  115* 120* 116*  CO2 27 25  --  '28 28 29  '$ GLUCOSE 90 91  --  98 113* 100*  BUN 86* 73*  --  74* 82* 69*  CREATININE 2.77* 2.43*  --  2.19* 1.93* 1.69*  CALCIUM 8.8* 8.4*  --  8.5* 8.5* 8.5*  MG 1.9 1.8  --  1.8 2.0 1.9  PHOS 4.2 4.1  --  3.1 2.4* 2.0*     Liver Function Tests: Recent Labs  Lab 10/14/21 0400 10/15/21 0414 10/16/21 0321 10/17/21 0418 10/18/21 0456  ALBUMIN 2.5* 2.4* 2.3* 2.3* 2.3*    No results for input(s): "LIPASE", "AMYLASE" in the last 168 hours. No results for input(s): "AMMONIA" in the last 168 hours.  CBC: Recent Labs  Lab 10/14/21 0400 10/15/21 0414 10/16/21 0321 10/17/21 0418 10/18/21 0456  WBC 6.2 6.0 6.7 6.5 6.3  NEUTROABS 4.2 3.8 4.1 3.8 3.1  HGB 8.7* 8.7* 8.7* 8.6* 8.8*  HCT 30.7* 31.2* 31.5* 31.2* 31.6*  MCV 78.7* 80.4 81.0 81.7 81.9  PLT 275 293 297 213 262     Cardiac Enzymes: No results for input(s): "CKTOTAL", "CKMB", "CKMBINDEX", "  TROPONINI" in the last 168 hours.  BNP: Invalid input(s): "POCBNP"  CBG: Recent Labs  Lab 10/17/21 1631 10/17/21 1925 10/17/21 2357 10/18/21 0311 10/18/21 0718  GLUCAP 103* 96 96 98 104*     Microbiology: Results for orders placed or performed during the hospital encounter of 09/19/21  SARS Coronavirus 2 by RT PCR (hospital order, performed in [facility] hospital lab) *cepheid single result test* Anterior Nasal Swab     Status: None   Collection Time: 09/19/21  9:57 AM   Specimen: Anterior Nasal Swab  Result Value Ref Range Status   SARS Coronavirus 2 by RT PCR NEGATIVE NEGATIVE  Final    Comment: (NOTE) SARS-CoV-2 target nucleic acids are NOT DETECTED.  The SARS-CoV-2 RNA is generally detectable in upper and lower respiratory specimens during the acute phase of infection. The lowest concentration of SARS-CoV-2 viral copies this assay can detect is 250 copies / mL. A negative result does not preclude SARS-CoV-2 infection and should not be used as the sole basis for treatment or other patient management decisions.  A negative result may occur with improper specimen collection / handling, submission of specimen other than nasopharyngeal swab, presence of viral mutation(s) within the areas targeted by this assay, and inadequate number of viral copies (<250 copies / mL). A negative result must be combined with clinical observations, patient history, and epidemiological information.  Fact Sheet for Patients:   https://www.patel.info/  Fact Sheet for Healthcare Providers: https://hall.com/  This test is not yet approved or  cleared by the [place] FDA and has been authorized for detection and/or diagnosis of SARS-CoV-2 by FDA under an Emergency Use Authorization (EUA).  This EUA will remain in effect (meaning this test can be used) for the duration of the COVID-19 declaration under Section 564(b)(1) of the Act, 21 U.S.C. section 360bbb-3(b)(1), unless the authorization is terminated or revoked sooner.  Performed at [facility], [address] [place], [place] [address]   Culture, Respiratory w Gram Stain     Status: None   Collection Time: 09/19/21 11:55 AM   Specimen: SPU  Result Value Ref Range Status   Specimen Description   Final    SPUTUM Performed at [facility], [place] [address], [place], [place] [address]    Special Requests   Final    NONE Performed at [facility], [place], [place] [address]    Gram Stain   Final    FEW WBC PRESENT, PREDOMINANTLY  PMN FEW GRAM POSITIVE COCCI IN PAIRS Performed at [facility], [place] [address] [place], [place] [address]    Culture RARE [name]  Final   Report Status 09/24/2021 FINAL  Final   Organism ID, Bacteria [name]  Final      Susceptibility   [name] - MIC*    CEFTAZIDIME 4 SENSITIVE Sensitive     CIPROFLOXACIN <=0.25 SENSITIVE Sensitive     GENTAMICIN 2 SENSITIVE Sensitive     IMIPENEM 1 SENSITIVE Sensitive     PIP/TAZO 8 SENSITIVE Sensitive     CEFEPIME 2 SENSITIVE Sensitive     * RARE [name]  MRSA Next Gen by PCR, Nasal     Status: None   Collection Time: 09/19/21  6:10 PM   Specimen: Nasal Mucosa; Nasal Swab  Result Value Ref Range Status   MRSA by PCR Next Gen NOT DETECTED NOT DETECTED Final    Comment: (NOTE) The GeneXpert MRSA Assay (FDA approved for  NASAL specimens only), is one component of a comprehensive MRSA colonization surveillance program. It is not intended to diagnose MRSA infection nor to guide or monitor treatment for MRSA infections. Test performance is not FDA approved in patients less than 53 years old. Performed at [facility], [address] [place], [place] [address]   Gastrointestinal Panel by PCR , Stool     Status: Abnormal   Collection Time: 09/21/21 10:53 AM   Specimen: Stool  Result Value Ref Range Status   Campylobacter species NOT DETECTED NOT DETECTED Final   Plesimonas shigelloides NOT DETECTED NOT DETECTED Final   Salmonella species NOT DETECTED NOT DETECTED Final   Yersinia enterocolitica DETECTED (A) NOT DETECTED Final    Comment: RESULT CALLED TO, READ BACK BY AND VERIFIED WITH: [facility] JACKSON AT 1126 09/22/21.PMF    Vibrio species NOT DETECTED NOT DETECTED Final   Vibrio cholerae NOT DETECTED NOT DETECTED Final   Enteroaggregative E coli (EAEC) NOT DETECTED NOT DETECTED Final   Enteropathogenic E coli (EPEC) NOT DETECTED NOT DETECTED Final   Enterotoxigenic E  coli (ETEC) NOT DETECTED NOT DETECTED Final   Shiga like toxin producing E coli (STEC) NOT DETECTED NOT DETECTED Final   Shigella/Enteroinvasive E coli (EIEC) NOT DETECTED NOT DETECTED Final   Cryptosporidium NOT DETECTED NOT DETECTED Final   Cyclospora cayetanensis NOT DETECTED NOT DETECTED Final   Entamoeba histolytica NOT DETECTED NOT DETECTED Final   Giardia lamblia NOT DETECTED NOT DETECTED Final   Adenovirus F40/41 NOT DETECTED NOT DETECTED Final   Astrovirus NOT DETECTED NOT DETECTED Final   Norovirus GI/GII NOT DETECTED NOT DETECTED Final   Rotavirus A NOT DETECTED NOT DETECTED Final   Sapovirus (I, II, IV, and V) NOT DETECTED NOT DETECTED Final    Comment: Performed at [facility], [address] [place], [place] [address]  C Difficile Quick Screen w PCR reflex     Status: None   Collection Time: 09/21/21 11:30 AM   Specimen: STOOL  Result Value Ref Range Status   C Diff antigen NEGATIVE NEGATIVE Final   C Diff toxin NEGATIVE NEGATIVE Final   C Diff interpretation No C. difficile detected.  Final    Comment: Performed at [facility], [address] [place], [place] [address]  Culture, blood (Routine X 2) w Reflex to ID Panel     Status: None   Collection Time: 09/21/21 12:28 PM   Specimen: BLOOD  Result Value Ref Range Status   Specimen Description BLOOD BLOOD RIGHT HAND  Final   Special Requests   Final    BOTTLES DRAWN AEROBIC AND ANAEROBIC Blood Culture adequate volume   Culture   Final    NO GROWTH 5 DAYS Performed at [facility], [address], [place], [place] [address]    Report Status 09/26/2021 FINAL  Final  Culture, blood (Routine X 2) w Reflex to ID Panel     Status: None   Collection Time: 09/21/21  2:08 PM   Specimen: BLOOD  Result Value Ref Range Status   Specimen Description BLOOD BLOOD LEFT HAND  Final   Special Requests   Final    BOTTLES DRAWN AEROBIC AND ANAEROBIC Blood Culture adequate volume   Culture   Final     NO GROWTH 5 DAYS Performed at [facility], [address] [place], [place] [address]    Report Status 09/26/2021 FINAL  Final  Respiratory (~20 pathogens) panel by PCR     Status: None  Collection Time: 09/21/21  6:14 PM   Specimen: Nasopharyngeal Swab; Respiratory  Result Value Ref Range Status   Adenovirus NOT DETECTED NOT DETECTED Final   Coronavirus 229E NOT DETECTED NOT DETECTED Final    Comment: (NOTE) The Coronavirus on the Respiratory Panel, DOES NOT test for the novel  Coronavirus (2019 nCoV)    Coronavirus HKU1 NOT DETECTED NOT DETECTED Final   Coronavirus NL63 NOT DETECTED NOT DETECTED Final   Coronavirus OC43 NOT DETECTED NOT DETECTED Final   Metapneumovirus NOT DETECTED NOT DETECTED Final   Rhinovirus / Enterovirus NOT DETECTED NOT DETECTED Final   Influenza A NOT DETECTED NOT DETECTED Final   Influenza B NOT DETECTED NOT DETECTED Final   Parainfluenza Virus 1 NOT DETECTED NOT DETECTED Final   Parainfluenza Virus 2 NOT DETECTED NOT DETECTED Final   Parainfluenza Virus 3 NOT DETECTED NOT DETECTED Final   Parainfluenza Virus 4 NOT DETECTED NOT DETECTED Final   Respiratory Syncytial Virus NOT DETECTED NOT DETECTED Final   Bordetella pertussis NOT DETECTED NOT DETECTED Final   Bordetella Parapertussis NOT DETECTED NOT DETECTED Final   Chlamydophila pneumoniae NOT DETECTED NOT DETECTED Final   Mycoplasma pneumoniae NOT DETECTED NOT DETECTED Final    Comment: Performed at [facility], [address], [place], [place] [address]  Culture, Respiratory w Gram Stain     Status: None   Collection Time: 10/06/21 10:59 AM   Specimen: Tracheal Aspirate; Respiratory  Result Value Ref Range Status   Specimen Description   Final    TRACHEAL ASPIRATE Performed at [facility], [address] [place], [place] [address]    Special Requests   Final    NONE Performed at [facility], [address] [place], [place] [address]    Gram Stain    Final    ABUNDANT WBC PRESENT, PREDOMINANTLY PMN ABUNDANT GRAM NEGATIVE RODS Performed at [facility], [place] [address] [place], [place] [address]    Culture ABUNDANT [name]  Final   Report Status 10/09/2021 FINAL  Final   Organism ID, Bacteria [name]  Final      Susceptibility   [name] - MIC*    CEFTAZIDIME 8 SENSITIVE Sensitive     CIPROFLOXACIN <=0.25 SENSITIVE Sensitive     GENTAMICIN 2 SENSITIVE Sensitive     IMIPENEM 1 SENSITIVE Sensitive     * ABUNDANT [name]    Coagulation Studies: No results for input(s): "LABPROT", "INR" in the last 72 hours.  Urinalysis: No results for input(s): "COLORURINE", "LABSPEC", "PHURINE", "GLUCOSEU", "HGBUR", "BILIRUBINUR", "KETONESUR", "PROTEINUR", "UROBILINOGEN", "NITRITE", "LEUKOCYTESUR" in the last 72 hours.  Invalid input(s): "APPERANCEUR"    Imaging: No results found.   Medications:    sodium chloride Stopped (10/07/21 0542)   feeding supplement (NEPRO CARB STEADY) 70 mL/hr at 10/18/21 0739    atorvastatin  10 mg Per Tube Daily   budesonide (PULMICORT) nebulizer solution  0.5 mg Nebulization BID   Chlorhexidine Gluconate Cloth  6 each Topical Daily   cholecalciferol  2,000 Units Per Tube Daily   epoetin (EPOGEN/PROCRIT) injection  20,000 Units Subcutaneous Weekly   feeding supplement (PROSource TF)  45 mL Per Tube QID   free water  200 mL Per Tube Q4H   heparin injection (subcutaneous)  5,000 Units Subcutaneous Q8H   heparin sodium (porcine)  2,000 Units Intravenous Q M,W,F-HD   ipratropium-albuterol  3 mL Nebulization BID   multivitamin  1 tablet Per Tube QHS   nutrition supplement (JUVEN)  1 packet Per Tube BID BM   mouth rinse  15 mL Mouth Rinse 4 times per day   pantoprazole sodium  40 mg Per Tube BID   potassium & sodium phosphates  1 packet Per Tube TID WC & HS   potassium chloride  40 mEq Per Tube Q4H   acetaminophen, acetaminophen, albuterol,  alteplase, bisacodyl, clonazepam, heparin, HYDROmorphone (DILAUDID) injection, midazolam, mouth rinse, oxyCODONE, pentafluoroprop-tetrafluoroeth, polyethylene glycol, QUEtiapine  Assessment/ Plan:  Gary [name] is a 66 y.o.  male with past medical conditions including COPD requiring 2 L nasal cannula, and congestive heart failure.  Patient presents to the emergency department with shortness of breath.  Patient has been admitted for COPD exacerbation ([place]) [J44.1] Obesity hypoventilation syndrome ([place]) [E66.2] Acute on chronic diastolic congestive heart failure ([name]) [I50.33] Acute respiratory failure with hypoxia and hypercapnia ([name]) [J96.01, J96.02] Acute on chronic respiratory failure with hypoxia and hypercapnia ([name]) [M22.63, J96.22]    Acute Kidney Injury with hyperkalemia, cause unidentified.  No IV contrast exposure and brief hypotensive event. Normal renal function 2 days prior.Renal Ultrasound shows mild right obstruction , simple cyst in  left kidney and bladder distendion.   - Patient appears to continue his renal recovery. Urine output adequate. No further dialysis is needed.   Lab Results  Component Value Date   CREATININE 1.69 (H) 10/18/2021   CREATININE 1.93 (H) 10/17/2021   CREATININE 2.19 (H) 10/16/2021    Intake/Output Summary (Last 24 hours) at 10/18/2021 1050 Last data filed at 10/18/2021 0739 Gross per 24 hour  Intake 3355.5 ml  Output 1100 ml  Net 2255.5 ml    Acute respiratory failure, Pseudomonas noted in sputum on September 19, 2021. IV antibiotics completed Monitoring of respiratory status as per pulmonary/critical care. -  Speaking valve in place  Chronic diastolic heart failure.  Echo from 08/07/2021 shows EF 60 to 65% with a mildly dilated left ventricular cavity and grade 1 diastolic dysfunction. -  Adequate urine output, fluid status acceptable  Anemia of critical illness, Hemoglobin 8.8, consider epogen as outpt.     LOS: [place] 7/5/202310:50 AM

## 2021-10-18 NOTE — Progress Notes (Signed)
Speech Language Pathology Treatment: Dysphagia;Passy Muir Speaking valve  Patient Details Name: Gary [name] MRN: [id number] DOB: 11-May-1955 Today's Date: 10/18/2021 Time: 3295-1884 SLP Time Calculation (min) (ACUTE ONLY): 65 min  Assessment / Plan / Recommendation Clinical Impression  Pt seen for both PMV and Dysphagia txs today. He required verbal cues and positioning support to fully awaken; noted min drowsy and not as verbally engaged as at tx session yesterday(stronger voice and talkative then). NSG reported min drowsy this morning (Ativan given at 2am last night d/t agitation/confusion -- has been sleepy all morning per NSG). This was d/w w/ MD as sedating medications such as Ativan will often impact pt's ability to participate (safely) in tx sessions. MD agreed to look at meds and try something less sedating w/ pt when needed.   Pt appears to present w/ mildly increased risk for aspiration and oropharyngeal phase dysphagia in setting of declined medical/Pulmonary status' and declined mental status, confusion currently. ANY decline in Cognitive/mental status can impact overall awareness/timing/engagement of swallow and safety during po tasks which increases risk for aspiration, choking. In addition, pt has had a lengthy hospitalization/illness w/ need for BOTH a tracheostomy (d/t Pulmonary status decline) and a PEG placement. Pt has not been taking an oral diet for ~4 weeks (previously he was on a Regular diet w/ thin liquids).  Pt's risk for aspiration is present but can be reduced when following general aspiration precautions, given feeding support and Supervision, and when using a modified diet consistency at this time as he continues to recover. Pt MUST also wear the PMV w/ ALL oral intake to optimize swallow function.      PMV TX:  Pt has been wearing the PMV w/ 1:1 Supervision and tolerating use/wear well. NSG has been placing PMV w/ good results, success. Trach: Shiley #7; XLT,  cuffed. This session was initiated w/ PMV Tx; wearing of the PMV. Explained the use and wear of the PMV to pt; trach and stoma area inspected; ensured Cuff was deflated placing the PMV. Pt's respiratory effort remained at his baseline w/ no overt increased exertion in Pulmonary status noted when talking, responding to basic questions. RR: 21-23, O2 sats 98-99%, HR upper 80s. FiO2 35% at 8L per chart. Cuff deflated at baseline. Pt exhibited min Dysphonia c/b decreased volume of speech -- suspect decreased Pulmonary effort. Decreased breath support and volume; intelligibility 75% This presentation could be impact from drowsiness from Ativan and lying in bed. Noted pt was less talkative w/ decreased volume vs at tx session yesterday. Encouraged pt to continue to wear the PMV maintaining placement for ~60 mins w/out noted O2 desaturation, or decline/change in RR/HR from his baseline. No overt discomfort noted in his respiratory effort -- pt stated it felt "fine" to wear/talk w/ PMV. No increased effort and no overt use of accessary muscles nor distress was noted in his breathing pattern. Pt spoke to both NSG and to MD while wearing PMV. Educated pt (and Sister) that wearing the PMV exercises pt's lungs, improves lung function. PMV remained on as pt consumed po's during BSE.   Pt appears to adequately tolerate PMV placement w/out overt, gross respiratory discomfort or distress; ANS remained adequately stable at his Baseline during wear/use. Much education was given on PMV use/wear w/ both pt and NSG -- precautions posted in room, MUST have Cuff deflation for PMV wear, checking and removing the air from the balloon b/f placing, placing/removing the PMV, and care of the PMV. Discussed that it MUST  be worn for all eating/drinking; and can be worn w/ Therapies(OT, PT) w/ Supervision. Must NOT be worn when sleeping and will be removed when not Supervised (at this time). Precautions posted at bedside and in chart.  Pt  remains w/ a Cuffed trach at this time. Noted Sticker placed on Cuff line, and in room. NSG made aware. MD updated. ST services will continue to monitor for further PMV needs while admitted.     DYSPHAGIA TX:   Pt consumed trials of ice chips, purees, and thing liquids w/ NO overt clinical s/s of aspiration noted; No immediate, overt cough, no decline in vocal quality; no decline in respiratory status during/post trials. O2 sats remained 97-99%. Pt stated he wanted "more water" and consumed a 2nd Cup -- ~12+ ozs total of thin liquids. Oral phase was adequate for bolus management and oral clearing of the boluses given. Oral clearing was appropriate b/t trials/consistencies. Time given b/t trials to not increase exertion d/t tasks, and pt's Baseline Pulmonary status. Pt did not attempt self-feeding and required MAX support and assistance. Min confusion during oral care; encouraged pt participation. Dentition present.   D/t pt's presentation in setting of current illness w/ recent tracheostomy/PEG/lengthy time on vent support, along w/ min declined mental status, risk for aspiration is increased. Recommend continue the dysphagia level 1(puree) diet but w/ upgrade to Thin liquids; general aspiration precautions; reduce Distractions during meals and engage pt at meal for self-feeding as able. REFLUX precs. Pills Whole vs Crushed in Puree for safer swallowing. Support w/ feeding at meals as needed. PT MUST WEAR THE PMV FOR ALL ORAL INTAKE. REST BREAKS DURING MEALS IF NEEDED. MD/NSG updated.    ST services will continue to follow pt while admitted for ongoing toleration of diet, trials to upgrade as able, and education as needed. Suspect that pt will be able to upgrade diet consistency. Precautions posted in room. MD/NSG updated.     HPI HPI: Per H&P "66 y/o male with multiple medical problems causing chronic hypercapneic respiratory failure who has had admissions in the past for exacerbations of the same and  mechanical ventilation presented for evaluation of dyspnea, weakness. It is unclear if the patient has been compliant with his medications and NIMV at home.  He was admitted by the hospitalists for a presumed COPD exacerbation in the ER, treated with bronchodilators and NIMV but his mental status and hypercarbia worsened despite those interventions so he required intubation.  PCCM consulted for admission.  He could not provide history for [place] because he was encephalopathic.".   Pt intubated 09/19/21, tracheostomy placed 10/03/21, and PEG placed 10/13/21.    PMH includes: diastolic CHF (EF 60 to 62%, grade III DD, 02/2021), COPD on home O2 2 L, HTN, OSA on CPAP, PVD, hyperlipidemia, former smoker, Morbid Obesity with a BMI 49.65, GI bleeding, CKD-3, iron deficiency anemia, who presents with shortness of breath.   Patient has had Multiple hospitalizations in recent months.      SLP Plan  Continue with current plan of care      Recommendations for follow up therapy are one component of a multi-disciplinary discharge planning process, led by the attending physician.  Recommendations may be updated based on patient status, additional functional criteria and insurance authorization.    Recommendations  Diet recommendations: Dysphagia 1 (puree);Thin liquid Liquids provided via: Cup;Straw (monitor) Medication Administration: Whole meds with puree (vs need to Crush) Supervision: Staff to assist with self feeding;Full supervision/cueing for compensatory strategies Compensations: Minimize environmental distractions;Slow  rate;Small sips/bites;Lingual sweep for clearance of pocketing;Follow solids with liquid Postural Changes and/or Swallow Maneuvers: Out of bed for meals;Seated upright 90 degrees;Upright 30-60 min after meal      Patient may use Passy-Muir Speech Valve: During all therapies with supervision;During PO intake/meals;During all waking hours (remove during sleep) (for ~45-60 mins at a time w/ rest  breaks as needed) PMSV Supervision: Full MD: Please consider changing trach tube to :  (TBD)         General recommendations:  (Dietician f/u) Oral Care Recommendations: Oral care BID;Oral care before and after PO;Staff/trained caregiver to provide oral care (support pt) Follow Up Recommendations: Skilled nursing-short term rehab (<3 hours/day) (TBD, vs LTAC) Assistance recommended at discharge: Frequent or constant Supervision/Assistance (feeding support) SLP Visit Diagnosis: Dysphagia, unspecified (R13.10) (presence of Tracheostomy; PEG. Lengthy NPO time while on Vent support) Plan: Continue with current plan of care             Orinda Kenner, [place], [place]; [place] [phone] (ascom) Jade Burkard  10/18/2021, 5:41 PM

## 2021-10-18 NOTE — Progress Notes (Signed)
Patient has been anxious, agitated, and paranoid regarding his care this shift. He is adamant on getting out of bed despite having great difficulty with turning due to generalized weakness. He has overall poor judgement and safety awareness and he is difficult to redirect. He remains alert and oriented, but has intermittent episodes of confusion which leads to misunderstandings with staff. Security had to be called at 2300 due to agitation and verbal hostility towards staff. [name], NP, has been in to speak with the patient regarding his care. Ativan was successful at helping him to rest at 0300. All vitals are WNL, and he appears comfortable at this time.  Cameron Ali, RN

## 2021-10-18 NOTE — Progress Notes (Signed)
PROGRESS NOTE    Gary [name]  WJX:[id number] DOB: [birth year]  DOA: 09/19/2021 Date of Service: 10/18/21 PCP: [facility] [place] Narrative / Hospital Course:  66 y/o male with multiple medical problems causing chronic hypercapneic respiratory failure who has had admissions in the past for exacerbations of the same and mechanical ventilation presented for evaluation of dyspnea, weakness. It is unclear if the patient has been compliant with his medications and NIMV at home.  He was admitted by the hospitalists for a presumed COPD exacerbation in the ER, treated with bronchodilators and NIMV but his mental status and hypercarbia worsened despite those interventions so he required intubation.  PCCM consulted for admission 09/22/2021 6/6: Presented to ED.  Required intubation and mechanical ventilation in the ED.  PCCM asked to admit ICU 6/7: Hold diuresis due to worsening Creatinine.  ENT consulted for Trach placement per family request 6/8: Pt continues to spike temps tmax 101.8 degrees F.  Flexiseal placed overnight draining foul smelling watery stool~Cdiff and GI panel pending  6/9: Fever resolved.  Negative for C- diff, GI panel still pending.  Creatinine slightly worsened, requiring low dose levophed (2 mcg). Holding diuresis 6/10: propofol discontinued due hypertriglyceridemia; failed SBT 6/11: Tracheal aspirate from 6/6 resulting with Pseudomonas, ABX changed to Cefepime. neurologically intact, awaiting Trach placement 6/12: Pt calmer today following addition of oral benzo's, narcotics, and Seroquel via tube.  Creatinine worsened with decreased UOP, holding diuresis, low threshold for Nephrology consult  6/13: Pt with worsening renal failure creatinine 4.15.  UOP overnight 25 ml.  Nephrology consulted plans to start pt on hemodialysis follow dialysis catheter placement Pt underwent hemodialysis without ultrafiltration 06/14: .  Plans for HD session today.  Agitation has  improved significantly with narcotics and antianxiety medications per tube   06/16: Tracheostomy canceled 06/15 due to hyperkalemia and tentatively rescheduled for 06/20  6/18 failure to wean from vent 06/20: ENT placed Tracheostomy (#7.0 XLT cuffed).  Developed mild epistaxis post unsuccessful attempt at NGT placement.  Later developed hematochezia/melena, Protonix gtt started and GI consulted. 06/21: Diuresed yesterday with limited results. Patient had softed blood pressures throughout the day and diuresis was held as a result. HD was performed by the patient clotted off the machine and was not able to be rinsed back. Repeat hemoglobin is above 8. 06/23: Pt remains mechanically intubated settings: FiO2 50%/PEEP 10.  CXR concerning for moderate right pleural effusion with associated right basilar atelectasis vs. infiltrate.  Will perform [place] Chest to determine if pt needs right sided thoracentesis  06/24: Remains on the ventilator, FiO2 requirements decreasing, respiratory culture with abundant gram-negative rods: [name], on Meropenem 06/25: Oxygen requirements down to 40%, chest x-ray shows improvement on dense right consolidation, Pseudomonas susceptibilities pending 06/25: Pt remains on the ventilator settings: PEEP 10 and FiO2 40%; will attempt to wean PEEP to 5 to perform SBT  06/28: Pt tolerated PS on 06/27.  Overnight pt had episodes of vomiting TF's suspect secondary to malposition of nasogastric tube.  Will consult IR for PEG tube placement if unable to place PEG tube pt will need postpyloric Dobbhoff   06/29: Pt tolerated PS 5/5 for over an hour yesterday, however due to fatigue placed back on PRVC.  Pt pulled NGT out several times overnight, therefore will not attempt to reinsert.  Plans for PEG tube placement per IR on 06/30.   06/30: PEG tube placed per IR  07/1: Pt tolerated trach collar '@40'$ % throughout the day  07/2: Pt placed on ventilator overnight settings 16/5/35%  tolerating well.  Will place pt on trach collar today and place orders for PT/OT 7/3: Hemodynamically stable.  Tolerating TC (has remained on TC since 7/2 @ 08:00), plan for speech therapy and PT/OT 7/4: Has maintained on trach collar for greater than 48 hours, tolerating Passy-Muir valve.  Plan to transfer out of ICU. SLP recs: dysphagia 1 (puree) w/ nectar liquid.  7/5: sodium and creatinine trending appropriately, Hgb stable/improving. Low K   Consultants:  PCCU Nephrology ENT GI IR  Procedures: 10/03/21: tracheostomy placement 10/12/21: dialysis permacath placement     Subjective: Patient reports feeling tired, no SOB/cough, no CP. He reports tolerating diet.      ASSESSMENT & PLAN:   Principal Problem:   Acute on chronic respiratory failure with hypoxia and hypercapnia ([name]) Active Problems:   Acute on chronic diastolic congestive heart failure ([name])   COPD with acute exacerbation ([name])   Acute metabolic encephalopathy   HTN (hypertension)   Obesity, Class III, BMI 40-49.9 (morbid obesity) ([place])   Obstructive sleep apnea   Chronic kidney disease, stage 3a ([name])   HLD (hyperlipidemia)   Pressure injury of skin   No problem-specific Assessment & Plan notes found for this encounter.  Acute on Chronic Hypoxic & Hypercapnic Respiratory Failure in the setting of acute decompensated HFpEF, AECOPD, Pseudomonas Pneumonia, & OSA/OHS Moderate right sided pleural effusion -ruled out, actually lung CONSOLIDATION -S/p  tracheostomy by ENT by 6/20 -Follow intermittent Chest X-ray & ABG as needed -Trach collar as needed  -VAP bundle implemented  -Bronchodilators & Pulmicort nebs -Completed 10 day course of ceftazidime for treatment of Pseudomonas   Acute decompensated HFpEF PMHx: Hypertension  Echocardiogram 08/07/2021: LVEF 60 to 06%, grade 1 diastolic dysfunction, normal RV systolic function -Continuous cardiac monitoring -Maintain MAP >65 -On intermittent hemodialysis  for volume removal   AKI superimposed CKD stage IIIa  Hypokalemia  Hypernatremia During admissions in February 2023, creatinine 1.8 with GFR 40; in May 2023 creatinine 1.1 with GFR of 60 -Monitor I&O's / urinary output -Follow BMP -Ensure adequate renal perfusion -Avoid nephrotoxic agents as able -Replace electrolytes as indicated -Pharmacy following for assistance with electrolyte replacement -Nephrology consulted appreciate input: HD per nephrology. -Renal Ultrasound showed mild right obstruction , simple cyst in  left kidney and bladder distendion.   -Cr down to 1.9, good UOP noted, d/c dialysis catheter at this time, monitor renal parameters daily.  -Increase free water flushes 7/4 -No further dialysis needed per nephro, see note 10/18/21    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~RESOLVED Pseudomonas Pneumonia ~persistent, initial ABX course short - on meropenem ~ TREATED -Monitor fever curve -Trend WBC's -Completed 10 day course of ceftazidime (started on 06/26) -C. Diff negative   Acute Blood Loss Anemia~RESOLVED Nasopharyngeal trauma vs Upper GI bleed -Trend CBC  -VTE px: subq heparin and SCD's  -Monitor for s/sx of bleeding and transfuse for Hgb <7 -Continue BID IV PPI -Consulted GI, no need for scope  -nephrology recommends consider epogen as outpatient    Hyperglycemia -CBG's q4h; Target range of 140 to 180 -SSI -Follow ICU Hypo/Hyperglycemia protocol  Acute Metabolic Encephalopathy in setting of CO2 Narcosis Sedation needs in setting of mechanical ventilation~RESOLVED  CT Head 09/19/21: negative;  UDS negative -Treatment of hypercapnia as outlined above -Maintain a RASS goal of 0  -Continue oxycodone and klonopin due to agitation/delirium      DVT prophylaxis: SCD and subcutaneous heparin Code Status: FULL Family Communication: pt declined my  offer to call family/ support person(s) today  Disposition Plan / TOC needs: out of ICU and onto progressive  unit 10/18/21, PT/OT following  Barriers to discharge / significant pending items: remains on tube feeds, diet advancing, remains on trach - may need LTAC,             Objective: Vitals:   10/18/21 0330 10/18/21 0400 10/18/21 0600 10/18/21 0700  BP:  112/77 114/73 115/75  Pulse:  87 89 87  Resp:  19    Temp:  98.4 F (36.9 C)    TempSrc:  Axillary    SpO2:  96% 92% (!) 88%  Weight: (!) 169 kg     Height:        Intake/Output Summary (Last 24 hours) at 10/18/2021 9924 Last data filed at 10/17/2021 2000 Gross per 24 hour  Intake 140 ml  Output 1100 ml  Net -960 ml   Filed Weights   10/13/21 0500 10/14/21 0800 10/18/21 0330  Weight: (!) 168 kg (!) 180 kg (!) 169 kg    Examination:  Constitutional:  VS as above General Appearance: alert, well-developed, well-nourished, NAD Eyes: Normal lids and conjunctive, non-icteric sclera Ears, Nose, Mouth, Throat: Normal appearance Neck: No masses, trachea midline Trach in place, appears clean and ventilating well  No thyroid enlargement/tenderness/mass appreciated Respiratory: Normal respiratory effort Breath sounds normal, no wheeze/rhonchi/rales Cardiovascular: S1/S2 normal, no murmur/rub/gallop auscultated Trace lower extremity edema Gastrointestinal: Nontender, no masses but habitus limits exam  Musculoskeletal:  No clubbing/cyanosis of digits Neurological: No cranial nerve deficit on limited exa Psychiatric: Normal judgment/insight Flat mood and affect       Scheduled Medications:   atorvastatin  10 mg Per Tube Daily   budesonide (PULMICORT) nebulizer solution  0.5 mg Nebulization BID   Chlorhexidine Gluconate Cloth  6 each Topical Daily   cholecalciferol  2,000 Units Per Tube Daily   epoetin (EPOGEN/PROCRIT) injection  10,000 Units Intravenous Q M,W,F-HD   feeding supplement (PROSource TF)  45 mL Per Tube QID   free water  200 mL Per Tube Q4H   heparin injection (subcutaneous)  5,000 Units  Subcutaneous Q8H   heparin sodium (porcine)  2,000 Units Intravenous Q M,W,F-HD   ipratropium-albuterol  3 mL Nebulization BID   multivitamin  1 tablet Per Tube QHS   nutrition supplement (JUVEN)  1 packet Per Tube BID BM   mouth rinse  15 mL Mouth Rinse 4 times per day   pantoprazole sodium  40 mg Per Tube BID   potassium & sodium phosphates  1 packet Per Tube TID WC & HS   potassium chloride  40 mEq Per Tube Q4H    Continuous Infusions:  sodium chloride Stopped (10/07/21 0542)   feeding supplement (NEPRO CARB STEADY) 1,000 mL (10/18/21 0628)    PRN Medications:  acetaminophen, acetaminophen, albuterol, alteplase, bisacodyl, clonazepam, heparin, HYDROmorphone (DILAUDID) injection, midazolam, mouth rinse, oxyCODONE, pentafluoroprop-tetrafluoroeth, polyethylene glycol, QUEtiapine  Antimicrobials:  Anti-infectives (From admission, onward)    Start     Dose/Rate Route Frequency Ordered Stop   10/12/21 1200  cefTAZidime (FORTAZ) 2 g in sodium chloride 0.9 % 100 mL IVPB        2 g 200 mL/hr over 30 Minutes Intravenous 2 times daily 10/12/21 1122 10/17/21 0844   10/12/21 0417  ceFAZolin (ANCEF) IVPB 1 g/50 mL premix  Status:  Discontinued        1 g 100 mL/hr over 30 Minutes Intravenous 30 min pre-op 10/12/21 0417 10/16/21 0751   10/09/21  2000  ceFEPIme (MAXIPIME) 1 g in sodium chloride 0.9 % 100 mL IVPB  Status:  Discontinued        1 g 200 mL/hr over 30 Minutes Intravenous Every 24 hours 10/09/21 1404 10/09/21 1515   10/09/21 2000  cefTAZidime (FORTAZ) 1 g in sodium chloride 0.9 % 100 mL IVPB  Status:  Discontinued        1 g 200 mL/hr over 30 Minutes Intravenous Every 24 hours 10/09/21 1515 10/12/21 1122   10/06/21 1830  meropenem (MERREM) 500 mg in sodium chloride 0.9 % 100 mL IVPB  Status:  Discontinued        500 mg 200 mL/hr over 30 Minutes Intravenous Every 24 hours 10/06/21 1735 10/09/21 1403   10/06/21 1800  doxycycline (VIBRAMYCIN) 100 mg in sodium chloride 0.9 % 250 mL  IVPB  Status:  Discontinued        100 mg 125 mL/hr over 120 Minutes Intravenous Every 12 hours 10/06/21 1657 10/08/21 0838   09/29/21 1000  ceFEPIme (MAXIPIME) 2 g in sodium chloride 0.9 % 100 mL IVPB        2 g 200 mL/hr over 30 Minutes Intravenous Every 12 hours 09/29/21 0812 09/30/21 2342   09/26/21 2200  ceFEPIme (MAXIPIME) 1 g in sodium chloride 0.9 % 100 mL IVPB  Status:  Discontinued        1 g 200 mL/hr over 30 Minutes Intravenous Every 24 hours 09/26/21 1154 09/29/21 0812   09/26/21 2000  ceFEPIme (MAXIPIME) 2 g in sodium chloride 0.9 % 100 mL IVPB  Status:  Discontinued        2 g 200 mL/hr over 30 Minutes Intravenous Every 12 hours 09/26/21 0809 09/26/21 1154   09/24/21 1200  ceFEPIme (MAXIPIME) 2 g in sodium chloride 0.9 % 100 mL IVPB  Status:  Discontinued        2 g 200 mL/hr over 30 Minutes Intravenous Every 8 hours 09/24/21 0950 09/26/21 0809   09/21/21 1430  azithromycin (ZITHROMAX) 500 mg in sodium chloride 0.9 % 250 mL IVPB        500 mg 250 mL/hr over 60 Minutes Intravenous Every 24 hours 09/21/21 1344 09/23/21 1543   09/21/21 1430  cefTRIAXone (ROCEPHIN) 2 g in sodium chloride 0.9 % 100 mL IVPB  Status:  Discontinued        2 g 200 mL/hr over 30 Minutes Intravenous Every 24 hours 09/21/21 1344 09/24/21 0922       Data Reviewed: I have personally reviewed following labs and imaging studies  CBC: Recent Labs  Lab 10/14/21 0400 10/15/21 0414 10/16/21 0321 10/17/21 0418 10/18/21 0456  WBC 6.2 6.0 6.7 6.5 6.3  NEUTROABS 4.2 3.8 4.1 3.8 3.1  HGB 8.7* 8.7* 8.7* 8.6* 8.8*  HCT 30.7* 31.2* 31.5* 31.2* 31.6*  MCV 78.7* 80.4 81.0 81.7 81.9  PLT 275 293 297 213 469   Basic Metabolic Panel: Recent Labs  Lab 10/14/21 0400 10/15/21 0414 10/15/21 1954 10/16/21 0321 10/17/21 0418 10/18/21 0456  NA 144 143  --  149* 151* 149*  K 3.7 2.9* 3.2* 3.2* 3.4* 3.0*  CL 109 109  --  115* 120* 116*  CO2 27 25  --  '28 28 29  '$ GLUCOSE 90 91  --  98 113* 100*  BUN 86*  73*  --  74* 82* 69*  CREATININE 2.77* 2.43*  --  2.19* 1.93* 1.69*  CALCIUM 8.8* 8.4*  --  8.5* 8.5* 8.5*  MG 1.9 1.8  --  1.8 2.0 1.9  PHOS 4.2 4.1  --  3.1 2.4* 2.0*   GFR: Estimated Creatinine Clearance: 73.9 mL/min (A) (by C-G formula based on SCr of 1.69 mg/dL (H)). Liver Function Tests: Recent Labs  Lab 10/14/21 0400 10/15/21 0414 10/16/21 0321 10/17/21 0418 10/18/21 0456  ALBUMIN 2.5* 2.4* 2.3* 2.3* 2.3*   No results for input(s): "LIPASE", "AMYLASE" in the last 168 hours. No results for input(s): "AMMONIA" in the last 168 hours. Coagulation Profile: No results for input(s): "INR", "PROTIME" in the last 168 hours. Cardiac Enzymes: No results for input(s): "CKTOTAL", "CKMB", "CKMBINDEX", "TROPONINI" in the last 168 hours. BNP (last 3 results) No results for input(s): "PROBNP" in the last 8760 hours. HbA1C: No results for input(s): "HGBA1C" in the last 72 hours. CBG: Recent Labs  Lab 10/17/21 1631 10/17/21 1925 10/17/21 2357 10/18/21 0311 10/18/21 0718  GLUCAP 103* 96 96 98 104*   Lipid Profile: No results for input(s): "CHOL", "HDL", "LDLCALC", "TRIG", "CHOLHDL", "LDLDIRECT" in the last 72 hours. Thyroid Function Tests: No results for input(s): "TSH", "T4TOTAL", "FREET4", "T3FREE", "THYROIDAB" in the last 72 hours. Anemia Panel: No results for input(s): "VITAMINB12", "FOLATE", "FERRITIN", "TIBC", "IRON", "RETICCTPCT" in the last 72 hours. Urine analysis:    Component Value Date/Time   COLORURINE STRAW (A) 09/19/2021 1247   APPEARANCEUR CLEAR (A) 09/19/2021 1247   APPEARANCEUR Clear 04/15/2014 1240   LABSPEC 1.009 09/19/2021 1247   LABSPEC 1.013 04/15/2014 1240   PHURINE 5.0 09/19/2021 1247   GLUCOSEU 150 (A) 09/19/2021 1247   GLUCOSEU Negative 04/15/2014 1240   HGBUR NEGATIVE 09/19/2021 1247   BILIRUBINUR NEGATIVE 09/19/2021 1247   BILIRUBINUR Negative 04/15/2014 1240   KETONESUR NEGATIVE 09/19/2021 1247   PROTEINUR NEGATIVE 09/19/2021 1247   NITRITE  NEGATIVE 09/19/2021 1247   LEUKOCYTESUR NEGATIVE 09/19/2021 1247   LEUKOCYTESUR Negative 04/15/2014 1240   Sepsis Labs: '@LABRCNTIP'$ (procalcitonin:4,lacticidven:4)  No results found for this or any previous visit (from the past 240 hour(s)).       Radiology Studies last 96 hours: No results found.          LOS: 29 days      Emeterio Reeve, DO [facility] 10/18/2021, 7:27 AM   Staff may message me via secure chat in [place]  but this may not receive immediate response,  please page for urgent matters!  If 7PM-7AM, please contact night-coverage [email or web address]  Dictation software was used to generate the above note. Typos may occur and escape review, as with typed/written notes. Please contact Dr Sheppard Coil directly for clarity if needed.

## 2021-10-18 NOTE — Progress Notes (Signed)
Physical Therapy Treatment Patient Details Name: Gary [name] MRN: [id number] DOB: Oct 07, 1955 Today's Date: 10/18/2021   History of Present Illness Patient was admitted 2/2 SOB, COPD exacerbation, acute metabolic encephalopathy, and acute on chronic hypoxic and hypercapnic respiratory failure in setting of acute decompensated HFpEF and AECOPD requiring intubation and mechanical ventilation. The pt is now s/p tracheostomy and PEG tube placement 11/05/21.    PT Comments    Pt was pleasant and motivated to participate during the session and put forth good effort throughout. Slightly confused but able to follow commands with extra cuing. Pt able to complete rolling L/R w/ +3 maxA for trunk control and leg management and with verbal/physical cuing for sequencing. Pt completed supine to/from sit but with maxA +3 for trunk control and leg management. Multiple attempts made to perform sit to stand but ultimately unable to complete due to pt's posterior lean causing him to slide off bed. Pt had difficulty leaning forward into postion and needing+2 maxA to prevent sliding off EOB. SpO2 and HR WNL throughout session w/ no other adverse symptom response reported/observed. Pt will benefit from PT services in a SNF setting upon discharge to safely address deficits listed in patient problem list for decreased caregiver assistance and eventual return to PLOF.    Recommendations for follow up therapy are one component of a multi-disciplinary discharge planning process, led by the attending physician.  Recommendations may be updated based on patient status, additional functional criteria and insurance authorization.  Follow Up Recommendations  Skilled nursing-short term rehab (<3 hours/day) Can patient physically be transported by private vehicle: No   Assistance Recommended at Discharge Frequent or constant Supervision/Assistance  Patient can return home with the following Two people to help with walking and/or  transfers;Two people to help with bathing/dressing/bathroom;Assistance with cooking/housework;Assist for transportation;Help with stairs or ramp for entrance   Equipment Recommendations   (TBD at next venue of care)    Recommendations for Other Services       Precautions / Restrictions Precautions Precautions: Fall Precaution Comments: IJ, G tube, trach Restrictions Weight Bearing Restrictions: No     Mobility  Bed Mobility Overal bed mobility: Needs Assistance Bed Mobility: Rolling, Supine to Sit, Sit to Supine Rolling: Max assist (+3 for physical assistance)   Supine to sit: Max assist (+3 for physical assistance) Sit to supine: Max assist (+3 for physical assistance)   General bed mobility comments: maxA +3 for for trunk control and leg management; verbal/physical cues needed for sequencing    Transfers Overall transfer level: Needs assistance Equipment used: Rolling walker (2 wheels) Transfers: Sit to/from Stand Sit to Stand: Max assist (+3 for physical assistance)           General transfer comment: attempted but unable to perform due to risk of pt sliding off bed    Ambulation/Gait               General Gait Details: unable to perform at this time   Stairs             Wheelchair Mobility    Modified Rankin (Stroke Patients Only)       Balance Overall balance assessment: Needs assistance Sitting-balance support: Bilateral upper extremity supported, Feet supported Sitting balance-[name] Scale: Fair     Standing balance support: Reliant on assistive device for balance, During functional activity, Bilateral upper extremity supported  Standing balance comment: attempted but unable to complete due to risk of pt sliding off bed  Cognition Arousal/Alertness: Awake/alert Behavior During Therapy: WFL for tasks assessed/performed Overall Cognitive Status: No family/caregiver present to determine baseline  cognitive functioning                                          Exercises      General Comments        Pertinent Vitals/Pain Pain Assessment Pain Assessment: No/denies pain    Home Living                          Prior Function            PT Goals (current goals can now be found in the care plan section) Progress towards PT goals: Progressing toward goals    Frequency    Min 2X/week      PT Plan Current plan remains appropriate    Co-evaluation              AM-PAC PT "6 Clicks" Mobility   Outcome Measure  Help needed turning from your back to your side while in a flat bed without using bedrails?: A Lot Help needed moving from lying on your back to sitting on the side of a flat bed without using bedrails?: A Lot Help needed moving to and from a bed to a chair (including a wheelchair)?: A Lot Help needed standing up from a chair using your arms (e.g., wheelchair or bedside chair)?: A Lot Help needed to walk in hospital room?: Total Help needed climbing 3-5 steps with a railing? : Total 6 Click Score: 10    End of Session Equipment Utilized During Treatment: Gait belt Activity Tolerance: Patient tolerated treatment well Patient left: in bed;with call bell/phone within reach;with nursing/sitter in room Nurse Communication: Mobility status PT Visit Diagnosis: Unsteadiness on feet (R26.81);Muscle weakness (generalized) (M62.81);Difficulty in walking, not elsewhere classified (R26.2)     Time: 1324-4010 PT Time Calculation (min) (ACUTE ONLY): 39 min  Charges:                        Turner Daniels, SPT  10/18/2021, 4:49 PM

## 2021-10-18 NOTE — Progress Notes (Signed)
   10/18/21 1600  Clinical Encounter Type  Visited With Patient and family together  Visit Type Initial  Referral From Nurse  Consult/Referral To Chaplain   Chaplain responded to nurse page. Chaplain provided compassionate presence and reflective listening as patient and family spoke about health situation. Chaplain provided compassionate presence and reflective listening as patient spoke about faith practices. Chaplain is available for follow up as needed.

## 2021-10-18 NOTE — Progress Notes (Signed)
[place] for Electrolyte Monitoring and Replacement   Recent Labs: Potassium (mmol/L)  Date Value  10/18/2021 3.0 (L)  04/15/2014 3.9   Magnesium (mg/dL)  Date Value  10/18/2021 1.9  08/21/2012 1.9   Calcium (mg/dL)  Date Value  10/18/2021 8.5 (L)   Calcium, Total (mg/dL)  Date Value  04/15/2014 8.3 (L)   Albumin (g/dL)  Date Value  10/18/2021 2.3 (L)  04/15/2014 2.9 (L)   Phosphorus (mg/dL)  Date Value  10/18/2021 2.0 (L)   Sodium (mmol/L)  Date Value  10/18/2021 149 (H)  03/15/2020 145 (H)  04/15/2014 140    Assessment: 66 y/o male with h/o PUD, OSA, HTN, CKD III, substance abuse, CHF, Hep C and COPD and COVID 19 (02/2021) who is admitted with COPD exacerbation, Pseudomonas pneumonia on ceftazidime, HFpEF, and AKI. Intubated and on fentanyl. Pharmacy is asked to follow and replace electrolytes while in CCU.  G-tube placed by IR 6/30  Nutrition: Tube feeds at 70 cc/hr + Prosource TID + Juven BIDM  Patient is experiencing renal recovery. Nephrology following, holding further dialysis at this point.  Goal of Therapy:  Electrolytes within normal limits  Plan:  --Na 149; continue free water flushes 200 mL per tube q4h --K 3, Kcl 40 mEq every 4 hours per tube x 2 doses --Phos 2, Phos-Nak 1 packet TIDACHS x 4 doses (contains ~ 28 mEq K+) --Patient care transferred from PCCM to [facility]. Will discontinue electrolyte consult at this time. Defer further ordering of labs and electrolyte replacement to primary team --Pharmacy will continue to monitor peripherally  Benita Gutter 10/18/2021 7:04 AM

## 2021-10-18 NOTE — Progress Notes (Signed)
RN stated she will do [facility] this afternoon

## 2021-10-19 ENCOUNTER — Encounter: Payer: Self-pay | Admitting: Registered Nurse

## 2021-10-19 ENCOUNTER — Other Ambulatory Visit (INDEPENDENT_AMBULATORY_CARE_PROVIDER_SITE_OTHER): Payer: Self-pay | Admitting: Nurse Practitioner

## 2021-10-19 DIAGNOSIS — J9621 Acute and chronic respiratory failure with hypoxia: Secondary | ICD-10-CM | POA: Diagnosis not present

## 2021-10-19 DIAGNOSIS — J9622 Acute and chronic respiratory failure with hypercapnia: Secondary | ICD-10-CM | POA: Diagnosis not present

## 2021-10-19 LAB — CBC WITH DIFFERENTIAL/PLATELET
Abs Immature Granulocytes: 0.04 10*3/uL (ref 0.00–0.07)
Basophils Absolute: 0.1 10*3/uL (ref 0.0–0.1)
Basophils Relative: 1 %
Eosinophils Absolute: 0.3 10*3/uL (ref 0.0–0.5)
Eosinophils Relative: 5 %
HCT: 30.9 % — ABNORMAL LOW (ref 39.0–52.0)
Hemoglobin: 8.5 g/dL — ABNORMAL LOW (ref 13.0–17.0)
Immature Granulocytes: 1 %
Lymphocytes Relative: 30 %
Lymphs Abs: 1.9 10*3/uL (ref 0.7–4.0)
MCH: 22.6 pg — ABNORMAL LOW (ref 26.0–34.0)
MCHC: 27.5 g/dL — ABNORMAL LOW (ref 30.0–36.0)
MCV: 82.2 fL (ref 80.0–100.0)
Monocytes Absolute: 0.8 10*3/uL (ref 0.1–1.0)
Monocytes Relative: 13 %
Neutro Abs: 3.1 10*3/uL (ref 1.7–7.7)
Neutrophils Relative %: 50 %
Platelets: 269 10*3/uL (ref 150–400)
RBC: 3.76 MIL/uL — ABNORMAL LOW (ref 4.22–5.81)
RDW: 17.6 % — ABNORMAL HIGH (ref 11.5–15.5)
WBC: 6.2 10*3/uL (ref 4.0–10.5)
nRBC: 0.5 % — ABNORMAL HIGH (ref 0.0–0.2)

## 2021-10-19 LAB — GLUCOSE, CAPILLARY
Glucose-Capillary: 95 mg/dL (ref 70–99)
Glucose-Capillary: 111 mg/dL — ABNORMAL HIGH (ref 70–99)
Glucose-Capillary: 104 mg/dL — ABNORMAL HIGH (ref 70–99)
Glucose-Capillary: 108 mg/dL — ABNORMAL HIGH (ref 70–99)
Glucose-Capillary: 95 mg/dL (ref 70–99)
Glucose-Capillary: 104 mg/dL — ABNORMAL HIGH (ref 70–99)

## 2021-10-19 LAB — RENAL FUNCTION PANEL
Albumin: 2.4 g/dL — ABNORMAL LOW (ref 3.5–5.0)
Anion gap: 5 (ref 5–15)
BUN: 69 mg/dL — ABNORMAL HIGH (ref 8–23)
CO2: 29 mmol/L (ref 22–32)
Calcium: 8.3 mg/dL — ABNORMAL LOW (ref 8.9–10.3)
Chloride: 113 mmol/L — ABNORMAL HIGH (ref 98–111)
Creatinine, Ser: 1.59 mg/dL — ABNORMAL HIGH (ref 0.61–1.24)
GFR, Estimated: 48 mL/min — ABNORMAL LOW (ref >60)
Glucose, Bld: 95 mg/dL (ref 70–99)
Phosphorus: 2.4 mg/dL — ABNORMAL LOW (ref 2.5–4.6)
Potassium: 3.8 mmol/L (ref 3.5–5.1)
Sodium: 147 mmol/L — ABNORMAL HIGH (ref 135–145)

## 2021-10-19 LAB — MAGNESIUM: Magnesium: 1.7 mg/dL (ref 1.7–2.4)

## 2021-10-19 MED ORDER — MAGNESIUM SULFATE 2 GM/50ML IV SOLN
2.0000 g | Freq: Once | INTRAVENOUS | Status: AC
  Administered 2021-10-19: 2 g via INTRAVENOUS
  Filled 2021-10-19: qty 50

## 2021-10-19 MED ORDER — POTASSIUM & SODIUM PHOSPHATES 280-160-250 MG PO PACK
1.0000 | PACK | Freq: Three times a day (TID) | ORAL | Status: AC
  Administered 2021-10-19 (×3): 1
  Filled 2021-10-19 (×3): qty 1

## 2021-10-19 NOTE — Progress Notes (Addendum)
Per Dr Mortimer Fries and NP E. Ouma, patient is to remain off vent tonight. Abg in am

## 2021-10-19 NOTE — Progress Notes (Addendum)
[place] for Electrolyte Monitoring and Replacement   Recent Labs: Potassium (mmol/L)  Date Value  10/19/2021 3.8  04/15/2014 3.9   Magnesium (mg/dL)  Date Value  10/19/2021 1.7  08/21/2012 1.9   Calcium (mg/dL)  Date Value  10/19/2021 8.3 (L)   Calcium, Total (mg/dL)  Date Value  04/15/2014 8.3 (L)   Albumin (g/dL)  Date Value  10/19/2021 2.4 (L)  04/15/2014 2.9 (L)   Phosphorus (mg/dL)  Date Value  10/19/2021 2.4 (L)   Sodium (mmol/L)  Date Value  10/19/2021 147 (H)  03/15/2020 145 (H)  04/15/2014 140    Assessment: 66 y/o male with h/o PUD, OSA, HTN, CKD III, substance abuse, CHF, Hep C and COPD and COVID 19 (02/2021) who is admitted with COPD exacerbation, Pseudomonas pneumonia on ceftazidime, HFpEF, and AKI. Intubated and on fentanyl. Pharmacy is asked to follow and replace electrolytes while in CCU.  G-tube placed by IR 6/30  Nutrition: Tube feeds at 70 cc/hr + Prosource TID + Juven BIDM  Patient is experiencing renal recovery. Nephrology following, holding further dialysis at this point.  Goal of Therapy:  Electrolytes within normal limits  Plan:  --Na 147 (from 149 yesterday); continue free water flushes 200 mL per tube q4h --K 3.8 (from 3 yesterday); will receive 21 mEq K+ from Phos-Nak supplementation as below --Phos 2.4 (from 2 yesterday); Phos-Nak 1 packet TIDACHS x 3 doses --Mg 1.7, magnesium sulfate 2 g IV x 1 --Follow-up electrolytes with AM labs tomorrow  Benita Gutter 10/19/2021 7:46 AM

## 2021-10-19 NOTE — Progress Notes (Signed)
[facility]  ROUNDING NOTE   Subjective:   Gary [name] is a 66 year old male with past medical conditions including COPD requiring 2 L nasal cannula, and congestive heart failure.  Patient presents to the emergency department with shortness of breath.  Patient has been admitted for COPD exacerbation ([place]) [J44.1] Obesity hypoventilation syndrome ([place]) [E66.2] Acute on chronic diastolic congestive heart failure ([name]) [I50.33] Acute respiratory failure with hypoxia and hypercapnia ([name]) [J96.01, J96.02] Acute on chronic respiratory failure with hypoxia and hypercapnia ([name]) [V40.08, J96.22]  Patient is known and has recently been followed by Dr. Holley Raring for acute kidney injury, resolved at his May appointment.    Update: Patient seen and evaluated at bedside in ICU Patient resting comfortably Received vent support overnight, plan to transition back to trach collar once fully awake Remains on tube feeds 75m/hr   07/05 0701 - 07/06 0700 In: 4011.2 [NG/GT:4011.2] Out: 800 [Urine:800] Lab Results  Component Value Date   CREATININE 1.59 (H) 10/19/2021   CREATININE 1.69 (H) 10/18/2021   CREATININE 1.93 (H) 10/17/2021     Objective:  Vital signs in last 24 hours:  Temp:  [98 F (36.7 C)-99 F (37.2 C)] 99 F (37.2 C) (07/06 0400) Pulse Rate:  [72-92] 77 (07/06 0804) Resp:  [0-33] 26 (07/06 0804) BP: (108-146)/(63-113) 127/82 (07/06 0800) SpO2:  [92 %-100 %] 99 % (07/06 0804) FiO2 (%):  [35 %-40 %] 40 % (07/06 0804)  Weight change:  Filed Weights   10/13/21 0500 10/14/21 0800 10/18/21 0330  Weight: (!) 168 kg (!) 180 kg (!) 169 kg    Intake/Output: I/O last 3 completed shifts: In: 5711.2 [NG/GT:5711.2] Out: 1100 [Urine:1100]   Intake/Output this shift:  Total I/O In: 136 [NG/GT:136] Out: 500 [Urine:500]  Physical Exam: General: Chronically ill-appearing  Head: Normocephalic, atraumatic.   Eyes: Anicteric  Lungs:  Diminished bases, Trach with vent  support  Heart: Regular rhythm  Abdomen:  Soft, nontender, obese, PEG tube in place  Extremities: No peripheral edema.  Neurologic: Alert, respond to simple questions  Skin: No lesions  Access: None    Basic Metabolic Panel: Recent Labs  Lab 10/15/21 0414 10/15/21 1954 10/16/21 0321 10/17/21 0418 10/18/21 0456 10/19/21 0328  NA 143  --  149* 151* 149* 147*  K 2.9* 3.2* 3.2* 3.4* 3.0* 3.8  CL 109  --  115* 120* 116* 113*  CO2 25  --  '28 28 29 29  '$ GLUCOSE 91  --  98 113* 100* 95  BUN 73*  --  74* 82* 69* 69*  CREATININE 2.43*  --  2.19* 1.93* 1.69* 1.59*  CALCIUM 8.4*  --  8.5* 8.5* 8.5* 8.3*  MG 1.8  --  1.8 2.0 1.9 1.7  PHOS 4.1  --  3.1 2.4* 2.0* 2.4*     Liver Function Tests: Recent Labs  Lab 10/15/21 0414 10/16/21 0321 10/17/21 0418 10/18/21 0456 10/19/21 0328  ALBUMIN 2.4* 2.3* 2.3* 2.3* 2.4*    No results for input(s): "LIPASE", "AMYLASE" in the last 168 hours. No results for input(s): "AMMONIA" in the last 168 hours.  CBC: Recent Labs  Lab 10/15/21 0414 10/16/21 0321 10/17/21 0418 10/18/21 0456 10/19/21 0328  WBC 6.0 6.7 6.5 6.3 6.2  NEUTROABS 3.8 4.1 3.8 3.1 3.1  HGB 8.7* 8.7* 8.6* 8.8* 8.5*  HCT 31.2* 31.5* 31.2* 31.6* 30.9*  MCV 80.4 81.0 81.7 81.9 82.2  PLT 293 297 213 262 269     Cardiac Enzymes: No results for input(s): "CKTOTAL", "CKMB", "  CKMBINDEX", "TROPONINI" in the last 168 hours.  BNP: Invalid input(s): "POCBNP"  CBG: Recent Labs  Lab 10/18/21 1548 10/18/21 1954 10/18/21 2337 10/19/21 0340 10/19/21 0742  GLUCAP 92 103* 115* 95 111*     Microbiology: Results for orders placed or performed during the hospital encounter of 09/19/21  SARS Coronavirus 2 by RT PCR (hospital order, performed in [facility] hospital lab) *cepheid single result test* Anterior Nasal Swab     Status: None   Collection Time: 09/19/21  9:57 AM   Specimen: Anterior Nasal Swab  Result Value Ref Range Status   SARS Coronavirus 2 by RT PCR  NEGATIVE NEGATIVE Final    Comment: (NOTE) SARS-CoV-2 target nucleic acids are NOT DETECTED.  The SARS-CoV-2 RNA is generally detectable in upper and lower respiratory specimens during the acute phase of infection. The lowest concentration of SARS-CoV-2 viral copies this assay can detect is 250 copies / mL. A negative result does not preclude SARS-CoV-2 infection and should not be used as the sole basis for treatment or other patient management decisions.  A negative result may occur with improper specimen collection / handling, submission of specimen other than nasopharyngeal swab, presence of viral mutation(s) within the areas targeted by this assay, and inadequate number of viral copies (<250 copies / mL). A negative result must be combined with clinical observations, patient history, and epidemiological information.  Fact Sheet for Patients:   https://www.patel.info/  Fact Sheet for Healthcare Providers: https://hall.com/  This test is not yet approved or  cleared by the [place] FDA and has been authorized for detection and/or diagnosis of SARS-CoV-2 by FDA under an Emergency Use Authorization (EUA).  This EUA will remain in effect (meaning this test can be used) for the duration of the COVID-19 declaration under Section 564(b)(1) of the Act, 21 U.S.C. section 360bbb-3(b)(1), unless the authorization is terminated or revoked sooner.  Performed at [facility], [address] [place], [place] [address]   Culture, Respiratory w Gram Stain     Status: None   Collection Time: 09/19/21 11:55 AM   Specimen: SPU  Result Value Ref Range Status   Specimen Description   Final    SPUTUM Performed at [facility], [place] [address] [place], [place] [address]    Special Requests   Final    NONE Performed at [facility], [place], [place] [address]    Gram Stain   Final    FEW WBC PRESENT,  PREDOMINANTLY PMN FEW GRAM POSITIVE COCCI IN PAIRS Performed at [facility], [place] [address], [place], [place] [address]    Culture RARE [name]  Final   Report Status 09/24/2021 FINAL  Final   Organism ID, Bacteria [name]  Final      Susceptibility   [name] - MIC*    CEFTAZIDIME 4 SENSITIVE Sensitive     CIPROFLOXACIN <=0.25 SENSITIVE Sensitive     GENTAMICIN 2 SENSITIVE Sensitive     IMIPENEM 1 SENSITIVE Sensitive     PIP/TAZO 8 SENSITIVE Sensitive     CEFEPIME 2 SENSITIVE Sensitive     * RARE [name]  MRSA Next Gen by PCR, Nasal     Status: None   Collection Time: 09/19/21  6:10 PM   Specimen: Nasal Mucosa; Nasal Swab  Result Value Ref Range Status   MRSA by PCR Next Gen NOT DETECTED NOT DETECTED Final    Comment: (NOTE) The GeneXpert MRSA Assay (FDA approved  for NASAL specimens only), is one component of a comprehensive MRSA colonization surveillance program. It is not intended to diagnose MRSA infection nor to guide or monitor treatment for MRSA infections. Test performance is not FDA approved in patients less than 56 years old. Performed at [facility], [address] [place], [place] [address]   Gastrointestinal Panel by PCR , Stool     Status: Abnormal   Collection Time: 09/21/21 10:53 AM   Specimen: Stool  Result Value Ref Range Status   Campylobacter species NOT DETECTED NOT DETECTED Final   Plesimonas shigelloides NOT DETECTED NOT DETECTED Final   Salmonella species NOT DETECTED NOT DETECTED Final   Yersinia enterocolitica DETECTED (A) NOT DETECTED Final    Comment: RESULT CALLED TO, READ BACK BY AND VERIFIED WITH: [facility] JACKSON AT 1126 09/22/21.PMF    Vibrio species NOT DETECTED NOT DETECTED Final   Vibrio cholerae NOT DETECTED NOT DETECTED Final   Enteroaggregative E coli (EAEC) NOT DETECTED NOT DETECTED Final   Enteropathogenic E coli (EPEC) NOT DETECTED NOT DETECTED Final    Enterotoxigenic E coli (ETEC) NOT DETECTED NOT DETECTED Final   Shiga like toxin producing E coli (STEC) NOT DETECTED NOT DETECTED Final   Shigella/Enteroinvasive E coli (EIEC) NOT DETECTED NOT DETECTED Final   Cryptosporidium NOT DETECTED NOT DETECTED Final   Cyclospora cayetanensis NOT DETECTED NOT DETECTED Final   Entamoeba histolytica NOT DETECTED NOT DETECTED Final   Giardia lamblia NOT DETECTED NOT DETECTED Final   Adenovirus F40/41 NOT DETECTED NOT DETECTED Final   Astrovirus NOT DETECTED NOT DETECTED Final   Norovirus GI/GII NOT DETECTED NOT DETECTED Final   Rotavirus A NOT DETECTED NOT DETECTED Final   Sapovirus (I, II, IV, and V) NOT DETECTED NOT DETECTED Final    Comment: Performed at [facility], [address] [place], [place] [address]  C Difficile Quick Screen w PCR reflex     Status: None   Collection Time: 09/21/21 11:30 AM   Specimen: STOOL  Result Value Ref Range Status   C Diff antigen NEGATIVE NEGATIVE Final   C Diff toxin NEGATIVE NEGATIVE Final   C Diff interpretation No C. difficile detected.  Final    Comment: Performed at [facility], [address] [place], [place] [address]  Culture, blood (Routine X 2) w Reflex to ID Panel     Status: None   Collection Time: 09/21/21 12:28 PM   Specimen: BLOOD  Result Value Ref Range Status   Specimen Description BLOOD BLOOD RIGHT HAND  Final   Special Requests   Final    BOTTLES DRAWN AEROBIC AND ANAEROBIC Blood Culture adequate volume   Culture   Final    NO GROWTH 5 DAYS Performed at [facility], [address] [place], [place] [address]    Report Status 09/26/2021 FINAL  Final  Culture, blood (Routine X 2) w Reflex to ID Panel     Status: None   Collection Time: 09/21/21  2:08 PM   Specimen: BLOOD  Result Value Ref Range Status   Specimen Description BLOOD BLOOD LEFT HAND  Final   Special Requests   Final    BOTTLES DRAWN AEROBIC AND ANAEROBIC Blood Culture adequate volume    Culture   Final    NO GROWTH 5 DAYS Performed at [facility], [address], [place],  [address]    Report Status 09/26/2021 FINAL  Final  Respiratory (~20 pathogens) panel by PCR     Status:  None   Collection Time: 09/21/21  6:14 PM   Specimen: Nasopharyngeal Swab; Respiratory  Result Value Ref Range Status   Adenovirus NOT DETECTED NOT DETECTED Final   Coronavirus 229E NOT DETECTED NOT DETECTED Final    Comment: (NOTE) The Coronavirus on the Respiratory Panel, DOES NOT test for the novel  Coronavirus (2019 nCoV)    Coronavirus HKU1 NOT DETECTED NOT DETECTED Final   Coronavirus NL63 NOT DETECTED NOT DETECTED Final   Coronavirus OC43 NOT DETECTED NOT DETECTED Final   Metapneumovirus NOT DETECTED NOT DETECTED Final   Rhinovirus / Enterovirus NOT DETECTED NOT DETECTED Final   Influenza A NOT DETECTED NOT DETECTED Final   Influenza B NOT DETECTED NOT DETECTED Final   Parainfluenza Virus 1 NOT DETECTED NOT DETECTED Final   Parainfluenza Virus 2 NOT DETECTED NOT DETECTED Final   Parainfluenza Virus 3 NOT DETECTED NOT DETECTED Final   Parainfluenza Virus 4 NOT DETECTED NOT DETECTED Final   Respiratory Syncytial Virus NOT DETECTED NOT DETECTED Final   Bordetella pertussis NOT DETECTED NOT DETECTED Final   Bordetella Parapertussis NOT DETECTED NOT DETECTED Final   Chlamydophila pneumoniae NOT DETECTED NOT DETECTED Final   Mycoplasma pneumoniae NOT DETECTED NOT DETECTED Final    Comment: Performed at [facility], [address], [place], [place] [address]  Culture, Respiratory w Gram Stain     Status: None   Collection Time: 10/06/21 10:59 AM   Specimen: Tracheal Aspirate; Respiratory  Result Value Ref Range Status   Specimen Description   Final    TRACHEAL ASPIRATE Performed at [facility], [address], [place], [place] [address]    Special Requests   Final    NONE Performed at [facility], [address] [place], [place]  [address]    Gram Stain   Final    ABUNDANT WBC PRESENT, PREDOMINANTLY PMN ABUNDANT GRAM NEGATIVE RODS Performed at [facility], [place] [address], [place], [place] [address]    Culture ABUNDANT [name]  Final   Report Status 10/09/2021 FINAL  Final   Organism ID, Bacteria [name]  Final      Susceptibility   [name] - MIC*    CEFTAZIDIME 8 SENSITIVE Sensitive     CIPROFLOXACIN <=0.25 SENSITIVE Sensitive     GENTAMICIN 2 SENSITIVE Sensitive     IMIPENEM 1 SENSITIVE Sensitive     * ABUNDANT [name]    Coagulation Studies: No results for input(s): "LABPROT", "INR" in the last 72 hours.  Urinalysis: No results for input(s): "COLORURINE", "LABSPEC", "PHURINE", "GLUCOSEU", "HGBUR", "BILIRUBINUR", "KETONESUR", "PROTEINUR", "UROBILINOGEN", "NITRITE", "LEUKOCYTESUR" in the last 72 hours.  Invalid input(s): "APPERANCEUR"    Imaging: No results found.   Medications:    sodium chloride Stopped (10/07/21 0542)   feeding supplement (NEPRO CARB STEADY) 70 mL/hr at 10/19/21 0757   magnesium sulfate bolus IVPB 2 g (10/19/21 0914)    atorvastatin  10 mg Per Tube Daily   budesonide (PULMICORT) nebulizer solution  0.5 mg Nebulization BID   Chlorhexidine Gluconate Cloth  6 each Topical Daily   cholecalciferol  2,000 Units Per Tube Daily   epoetin (EPOGEN/PROCRIT) injection  20,000 Units Subcutaneous Weekly   feeding supplement (PROSource TF)  45 mL Per Tube QID   free water  200 mL Per Tube Q4H   heparin injection (subcutaneous)  5,000 Units Subcutaneous Q8H   heparin sodium (porcine)  2,000 Units Intravenous Q M,W,F-HD   ipratropium-albuterol  3 mL Nebulization BID  multivitamin  1 tablet Per Tube QHS   nutrition supplement (JUVEN)  1 packet Per Tube BID BM   mouth rinse  15 mL Mouth Rinse 4 times per day   pantoprazole sodium  40 mg Per Tube BID   potassium & sodium phosphates  1 packet Per Tube TID WC & HS   traZODone   50 mg Oral QHS   acetaminophen, acetaminophen, albuterol, alteplase, bisacodyl, clonazepam, heparin, HYDROmorphone (DILAUDID) injection, mouth rinse, oxyCODONE, pentafluoroprop-tetrafluoroeth, polyethylene glycol, QUEtiapine  Assessment/ Plan:  Gary [name] is a 66 y.o.  male with past medical conditions including COPD requiring 2 L nasal cannula, and congestive heart failure.  Patient presents to the emergency department with shortness of breath.  Patient has been admitted for COPD exacerbation ([place]) [J44.1] Obesity hypoventilation syndrome ([place]) [E66.2] Acute on chronic diastolic congestive heart failure ([name]) [I50.33] Acute respiratory failure with hypoxia and hypercapnia ([name]) [J96.01, J96.02] Acute on chronic respiratory failure with hypoxia and hypercapnia ([name]) [T01.60, J96.22]    Acute Kidney Injury with hyperkalemia, cause unidentified.  No IV contrast exposure and brief hypotensive event. Normal renal function 2 days prior.Renal Ultrasound shows mild right obstruction , simple cyst in  left kidney and bladder distendion.   -Patient continues to experience renal recovery.  Urine output acceptable.  No further plans for dialysis at this time.  Lab Results  Component Value Date   CREATININE 1.59 (H) 10/19/2021   CREATININE 1.69 (H) 10/18/2021   CREATININE 1.93 (H) 10/17/2021    Intake/Output Summary (Last 24 hours) at 10/19/2021 0938 Last data filed at 10/19/2021 0800 Gross per 24 hour  Intake 2491.67 ml  Output 1300 ml  Net 1191.67 ml    Acute respiratory failure, Pseudomonas noted in sputum on September 19, 2021. IV antibiotics completed Monitoring of respiratory status as per pulmonary/critical care. -Received vent support overnight.  Will transition back to trach collar once fully awake.  Chronic diastolic heart failure.  Echo from 08/07/2021 shows EF 60 to 65% with a mildly dilated left ventricular cavity and grade 1 diastolic dysfunction. -Excellent urine output  recorded.  Anemia of critical illness, Hemoglobin 8.5, patient was receiving EPO with dialysis treatments.  Have transitioned to weekly subcu EPO 20,000 units.    LOS: [place] 7/6/20239:38 AM

## 2021-10-19 NOTE — Progress Notes (Signed)
PROGRESS NOTE    Gary [name]  CLE:[id number] DOB: December 30, 1955  DOA: 09/19/2021 Date of Service: 10/19/21 PCP: [facility] Course:  66 y/o male with multiple medical problems causing chronic hypercapneic respiratory failure who has had admissions in the past for exacerbations of the same and mechanical ventilation presented for evaluation of dyspnea, weakness. It is unclear if the patient has been compliant with his medications and NIMV at home.  He was admitted by the hospitalists for a presumed COPD exacerbation in the ER, treated with bronchodilators and NIMV but his mental status and hypercarbia worsened despite those interventions so he required intubation.   6/6: Presented to ED.  Required intubation and mechanical ventilation in the ED.  PCCM admit ICU 6/7: Hold diuresis due to worsening Creatinine.  ENT consulted for Trach placement per family request 6/8: Pt continues to spike temps tmax 101.8 degrees F.  Flexiseal placed overnight draining foul smelling watery stool~Cdiff and GI panel pending  6/9: Fever resolved.  Negative for C- diff, GI panel still pending.  Creatinine slightly worsened, requiring low dose levophed (2 mcg). Holding diuresis 6/10: propofol discontinued due hypertriglyceridemia; failed SBT 6/11: Tracheal aspirate from 6/6 resulting with Pseudomonas, ABX changed to Cefepime. neurologically intact, awaiting Trach placement 6/12: Pt calmer today following addition of oral benzo's, narcotics, and Seroquel via tube.  Creatinine worsened with decreased UOP, holding diuresis, low threshold for Nephrology consult  6/13: Pt with worsening renal failure creatinine 4.15.  UOP overnight 25 ml.  Nephrology consulted plans to start pt on hemodialysis follow dialysis catheter placement Pt underwent hemodialysis without ultrafiltration 06/14: .  Plans for HD session today.  Agitation has improved significantly with narcotics and antianxiety medications  per tube   06/16: Tracheostomy canceled 06/15 due to hyperkalemia and tentatively rescheduled for 06/20  6/18 failure to wean from vent 06/20: ENT placed Tracheostomy (#7.0 XLT cuffed).  Developed mild epistaxis post unsuccessful attempt at NGT placement.  Later developed hematochezia/melena, Protonix gtt started and GI consulted. 06/21: Diuresed yesterday with limited results. Patient had softed blood pressures throughout the day and diuresis was held as a result. HD was performed by the patient clotted off the machine and was not able to be rinsed back. Repeat hemoglobin is above 8. 06/23: Pt remains mechanically intubated settings: FiO2 50%/PEEP 10.  CXR concerning for moderate right pleural effusion with associated right basilar atelectasis vs. infiltrate.  Will perform [place] Chest to determine if pt needs right sided thoracentesis  06/24: Remains on the ventilator, FiO2 requirements decreasing, respiratory culture with abundant gram-negative rods: [name], on Meropenem 06/25: Oxygen requirements down to 40%, chest x-ray shows improvement on dense right consolidation, Pseudomonas susceptibilities pending 06/25: Pt remains on the ventilator settings: PEEP 10 and FiO2 40%; will attempt to wean PEEP to 5 to perform SBT  06/28: Pt tolerated PS on 06/27.  Overnight pt had episodes of vomiting TF's suspect secondary to malposition of nasogastric tube.  Will consult IR for PEG tube placement if unable to place PEG tube pt will need postpyloric Dobbhoff   06/29: Pt tolerated PS 5/5 for over an hour yesterday, however due to fatigue placed back on PRVC.  Pt pulled NGT out several times overnight, therefore will not attempt to reinsert.  Plans for PEG tube placement per IR on 06/30.   06/30: PEG tube placed per IR  07/1: Pt tolerated trach collar '@40'$ % throughout the day 07/2: Pt placed on ventilator overnight settings 16/5/35% tolerating well.  Will place pt on trach collar today and place orders  for PT/OT 7/3: Hemodynamically stable.  Tolerating TC (has remained on TC since 7/2 @ 08:00), plan for speech therapy and PT/OT 7/4: Has maintained on trach collar for greater than 48 hours, tolerating Passy-Muir valve.  Plan to transfer out of ICU. SLP recs: dysphagia 1 (puree) w/ nectar liquid.  7/5-7/6: sodium and creatinine trending appropriately, Hgb stable. Low K repleted and stable. Was off vent qhs (orders d/c or missed?) and was restarted. Will need LTAC, TOC following   Consultants:  PCCU Nephrology ENT GI IR  Procedures: 10/03/21: tracheostomy placement 10/12/21: dialysis permacath placement  10/13/21: G-tube placed    Subjective: Patient reports feeling tired, no SOB/cough, no CP. He reports tolerating diet.      ASSESSMENT & PLAN:   Principal Problem:   Acute on chronic respiratory failure with hypoxia and hypercapnia ([name]) Active Problems:   Acute on chronic diastolic congestive heart failure ([name])   COPD with acute exacerbation ([name])   Acute metabolic encephalopathy   HTN (hypertension)   Obesity, Class III, BMI 40-49.9 (morbid obesity) ([place])   Obstructive sleep apnea   Chronic kidney disease, stage 3a ([name])   HLD (hyperlipidemia)   Pressure injury of skin   No problem-specific Assessment & Plan notes found for this encounter.  Acute on Chronic Hypoxic & Hypercapnic Respiratory Failure in the setting of acute decompensated HFpEF, AECOPD, Pseudomonas Pneumonia, & OSA/OHS Moderate right sided pleural effusion -ruled out, actually lung CONSOLIDATION -S/p  tracheostomy by ENT by 6/20 -Follow intermittent Chest X-ray & ABG as needed -Trach collar as needed / qhs -VAP bundle implemented  -Bronchodilators & Pulmicort nebs -Completed 10 day course of ceftazidime for treatment  of Pseudomonas   Acute decompensated HFpEF PMHx: Hypertension  Echocardiogram 08/07/2021: LVEF 60 to 28%, grade 1 diastolic dysfunction, normal RV systolic function -Continuous  cardiac monitoring -Maintain MAP >65 -On intermittent hemodialysis for volume removal   AKI superimposed CKD stage IIIa  Hypokalemia  Hypernatremia During admissions in February 2023, creatinine 1.8 with GFR 40; in May 2023 creatinine 1.1 with GFR of 60 -Monitor I&O's / urinary output -Follow BMP -Ensure adequate renal perfusion -Avoid nephrotoxic agents as able -Replace electrolytes as indicated -Pharmacy following for assistance with electrolyte replacement -Nephrology consulted appreciate input: HD per nephrology. -Renal Ultrasound showed mild right obstruction , simple cyst in  left kidney and bladder distendion.   -Cr down, good UOP noted, d/c dialysis catheter at this time, monitor renal parameters daily.  -Increase free water flushes 7/4 -No further dialysis needed per nephro, see note 10/18/21    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~RESOLVED Pseudomonas Pneumonia ~persistent, initial ABX course short - on meropenem ~ TREATED -Monitor fever curve -Trend WBC's -Completed 10 day course of ceftazidime (started on 06/26) -C. Diff negative   Acute Blood Loss Anemia~RESOLVED Nasopharyngeal trauma vs Upper GI bleed -Trend CBC  -VTE px: subq heparin and SCD's  -Monitor for s/sx of bleeding and transfuse for Hgb <7 -Continue BID IV PPI -Consulted GI, no need for scope  -nephrology recommends consider epogen as outpatient    Hyperglycemia -CBG's q4h; Target range of 140 to 180 -SSI -Follow ICU Hypo/Hyperglycemia protocol  Acute Metabolic Encephalopathy in setting of CO2 Narcosis Sedation needs in setting of mechanical ventilation~RESOLVED  CT Head 09/19/21: negative;  UDS negative -Treatment of hypercapnia as outlined above -Maintain a RASS goal of 0  -Continue oxycodone and klonopin prn due to agitation/delirium but limit these if possible  Lurline Idol  in place Gtube in place Requiring ventilatory support at night Working on increasing po intake, SLP following w/  some concern for mild aspiration CXR in AM tomorrow 07/07 will have been onthin liquids 48 hours at that point, if any signs aspiration will need to back off on po    DVT prophylaxis: SCD and subcutaneous heparin Code Status: FULL Family Communication: pt declined my offer to call family/ support person(s) today  Disposition Plan / TOC needs: out of ICU and onto progressive unit 10/18/21, PT/OT following, may be able to transfer to medsurg but need to know if they'll take patietn w/ trach/vent qhs  Barriers to discharge / significant pending items: remains on tube feeds, diet advancing, remains on trach - may need LTAC,             Objective: Vitals:   10/19/21 0600 10/19/21 0700 10/19/21 0800 10/19/21 0804  BP: (!) 128/91 (!) 140/92 127/82   Pulse: 79 72 76 77  Resp: (!) 0 19 12 (!) 26  Temp:      TempSrc:      SpO2: 100% 99% 98% 99%  Weight:      Height:        Intake/Output Summary (Last 24 hours) at 10/19/2021 0919 Last data filed at 10/19/2021 0800 Gross per 24 hour  Intake 2491.67 ml  Output 1300 ml  Net 1191.67 ml    Filed Weights   10/13/21 0500 10/14/21 0800 10/18/21 0330  Weight: (!) 168 kg (!) 180 kg (!) 169 kg    Examination:  Constitutional:  VS as above General Appearance: alert, well-developed, well-nourished, NAD Eyes: Normal lids and conjunctive, non-icteric sclera Ears, Nose, Mouth, Throat: Normal appearance Neck: No masses, trachea midline Trach in place, appears clean and ventilating well  No thyroid enlargement/tenderness/mass appreciated Respiratory: Normal respiratory effort Breath sounds normal, no wheeze/rhonchi/rales Cardiovascular: S1/S2 normal, no murmur/rub/gallop auscultated Trace lower extremity edema Gastrointestinal: Nontender, no masses but habitus limits exam  Musculoskeletal:  No clubbing/cyanosis of digits Neurological: No cranial nerve deficit on limited exa Psychiatric: Normal judgment/insight mood and affect  today more talkative, he is frustrated, he is motivated to work w/ PT/OT        Scheduled Medications:   atorvastatin  10 mg Per Tube Daily   budesonide (PULMICORT) nebulizer solution  0.5 mg Nebulization BID   Chlorhexidine Gluconate Cloth  6 each Topical Daily   cholecalciferol  2,000 Units Per Tube Daily   epoetin (EPOGEN/PROCRIT) injection  20,000 Units Subcutaneous Weekly   feeding supplement (PROSource TF)  45 mL Per Tube QID   free water  200 mL Per Tube Q4H   heparin injection (subcutaneous)  5,000 Units Subcutaneous Q8H   heparin sodium (porcine)  2,000 Units Intravenous Q M,W,F-HD   ipratropium-albuterol  3 mL Nebulization BID   multivitamin  1 tablet Per Tube QHS   nutrition supplement (JUVEN)  1 packet Per Tube BID BM   mouth rinse  15 mL Mouth Rinse 4 times per day   pantoprazole sodium  40 mg Per Tube BID   potassium & sodium phosphates  1 packet Per Tube TID WC & HS   traZODone  50 mg Oral QHS    Continuous Infusions:  sodium chloride Stopped (10/07/21 0542)   feeding supplement (NEPRO CARB STEADY) 70 mL/hr at 10/19/21 0757   magnesium sulfate bolus IVPB 2 g (10/19/21 0914)    PRN Medications:  acetaminophen, acetaminophen, albuterol, alteplase, bisacodyl, clonazepam, heparin, HYDROmorphone (DILAUDID) injection, mouth rinse, oxyCODONE, pentafluoroprop-tetrafluoroeth,  polyethylene glycol, QUEtiapine  Antimicrobials:  Anti-infectives (From admission, onward)    Start     Dose/Rate Route Frequency Ordered Stop   10/12/21 1200  cefTAZidime (FORTAZ) 2 g in sodium chloride 0.9 % 100 mL IVPB        2 g 200 mL/hr over 30 Minutes Intravenous 2 times daily 10/12/21 1122 10/17/21 0844   10/12/21 0417  ceFAZolin (ANCEF) IVPB 1 g/50 mL premix  Status:  Discontinued        1 g 100 mL/hr over 30 Minutes Intravenous 30 min pre-op 10/12/21 0417 10/16/21 0751   10/09/21 2000  ceFEPIme (MAXIPIME) 1 g in sodium chloride 0.9 % 100 mL IVPB  Status:  Discontinued        1 g 200  mL/hr over 30 Minutes Intravenous Every 24 hours 10/09/21 1404 10/09/21 1515   10/09/21 2000  cefTAZidime (FORTAZ) 1 g in sodium chloride 0.9 % 100 mL IVPB  Status:  Discontinued        1 g 200 mL/hr over 30 Minutes Intravenous Every 24 hours 10/09/21 1515 10/12/21 1122   10/06/21 1830  meropenem (MERREM) 500 mg in sodium chloride 0.9 % 100 mL IVPB  Status:  Discontinued        500 mg 200 mL/hr over 30 Minutes Intravenous Every 24 hours 10/06/21 1735 10/09/21 1403   10/06/21 1800  doxycycline (VIBRAMYCIN) 100 mg in sodium chloride 0.9 % 250 mL IVPB  Status:  Discontinued        100 mg 125 mL/hr over 120 Minutes Intravenous Every 12 hours 10/06/21 1657 10/08/21 0838   09/29/21 1000  ceFEPIme (MAXIPIME) 2 g in sodium chloride 0.9 % 100 mL IVPB        2 g 200 mL/hr over 30 Minutes Intravenous Every 12 hours 09/29/21 0812 09/30/21 2342   09/26/21 2200  ceFEPIme (MAXIPIME) 1 g in sodium chloride 0.9 % 100 mL IVPB  Status:  Discontinued        1 g 200 mL/hr over 30 Minutes Intravenous Every 24 hours 09/26/21 1154 09/29/21 0812   09/26/21 2000  ceFEPIme (MAXIPIME) 2 g in sodium chloride 0.9 % 100 mL IVPB  Status:  Discontinued        2 g 200 mL/hr over 30 Minutes Intravenous Every 12 hours 09/26/21 0809 09/26/21 1154   09/24/21 1200  ceFEPIme (MAXIPIME) 2 g in sodium chloride 0.9 % 100 mL IVPB  Status:  Discontinued        2 g 200 mL/hr over 30 Minutes Intravenous Every 8 hours 09/24/21 0950 09/26/21 0809   09/21/21 1430  azithromycin (ZITHROMAX) 500 mg in sodium chloride 0.9 % 250 mL IVPB        500 mg 250 mL/hr over 60 Minutes Intravenous Every 24 hours 09/21/21 1344 09/23/21 1543   09/21/21 1430  cefTRIAXone (ROCEPHIN) 2 g in sodium chloride 0.9 % 100 mL IVPB  Status:  Discontinued        2 g 200 mL/hr over 30 Minutes Intravenous Every 24 hours 09/21/21 1344 09/24/21 0922       Data Reviewed: I have personally reviewed following labs and imaging studies  CBC: Recent Labs  Lab  10/15/21 0414 10/16/21 0321 10/17/21 0418 10/18/21 0456 10/19/21 0328  WBC 6.0 6.7 6.5 6.3 6.2  NEUTROABS 3.8 4.1 3.8 3.1 3.1  HGB 8.7* 8.7* 8.6* 8.8* 8.5*  HCT 31.2* 31.5* 31.2* 31.6* 30.9*  MCV 80.4 81.0 81.7 81.9 82.2  PLT 293 297 213 262 269  Basic Metabolic Panel: Recent Labs  Lab 10/15/21 0414 10/15/21 1954 10/16/21 0321 10/17/21 0418 10/18/21 0456 10/19/21 0328  NA 143  --  149* 151* 149* 147*  K 2.9* 3.2* 3.2* 3.4* 3.0* 3.8  CL 109  --  115* 120* 116* 113*  CO2 25  --  '28 28 29 29  '$ GLUCOSE 91  --  98 113* 100* 95  BUN 73*  --  74* 82* 69* 69*  CREATININE 2.43*  --  2.19* 1.93* 1.69* 1.59*  CALCIUM 8.4*  --  8.5* 8.5* 8.5* 8.3*  MG 1.8  --  1.8 2.0 1.9 1.7  PHOS 4.1  --  3.1 2.4* 2.0* 2.4*    GFR: Estimated Creatinine Clearance: 78.5 mL/min (A) (by C-G formula based on SCr of 1.59 mg/dL (H)). Liver Function Tests: Recent Labs  Lab 10/15/21 0414 10/16/21 0321 10/17/21 0418 10/18/21 0456 10/19/21 0328  ALBUMIN 2.4* 2.3* 2.3* 2.3* 2.4*    No results for input(s): "LIPASE", "AMYLASE" in the last 168 hours. No results for input(s): "AMMONIA" in the last 168 hours. Coagulation Profile: No results for input(s): "INR", "PROTIME" in the last 168 hours. Cardiac Enzymes: No results for input(s): "CKTOTAL", "CKMB", "CKMBINDEX", "TROPONINI" in the last 168 hours. BNP (last 3 results) No results for input(s): "PROBNP" in the last 8760 hours. HbA1C: No results for input(s): "HGBA1C" in the last 72 hours. CBG: Recent Labs  Lab 10/18/21 1548 10/18/21 1954 10/18/21 2337 10/19/21 0340 10/19/21 0742  GLUCAP 92 103* 115* 95 111*    Lipid Profile: No results for input(s): "CHOL", "HDL", "LDLCALC", "TRIG", "CHOLHDL", "LDLDIRECT" in the last 72 hours. Thyroid Function Tests: No results for input(s): "TSH", "T4TOTAL", "FREET4", "T3FREE", "THYROIDAB" in the last 72 hours. Anemia Panel: No results for input(s): "VITAMINB12", "FOLATE", "FERRITIN", "TIBC",  "IRON", "RETICCTPCT" in the last 72 hours. Urine analysis:    Component Value Date/Time   COLORURINE STRAW (A) 09/19/2021 1247   APPEARANCEUR CLEAR (A) 09/19/2021 1247   APPEARANCEUR Clear 04/15/2014 1240   LABSPEC 1.009 09/19/2021 1247   LABSPEC 1.013 04/15/2014 1240   PHURINE 5.0 09/19/2021 1247   GLUCOSEU 150 (A) 09/19/2021 1247   GLUCOSEU Negative 04/15/2014 1240   HGBUR NEGATIVE 09/19/2021 1247   BILIRUBINUR NEGATIVE 09/19/2021 1247   BILIRUBINUR Negative 04/15/2014 1240   KETONESUR NEGATIVE 09/19/2021 1247   PROTEINUR NEGATIVE 09/19/2021 1247   NITRITE NEGATIVE 09/19/2021 1247   LEUKOCYTESUR NEGATIVE 09/19/2021 1247   LEUKOCYTESUR Negative 04/15/2014 1240   Sepsis Labs: '@LABRCNTIP'$ (procalcitonin:4,lacticidven:4)  No results found for this or any previous visit (from the past 240 hour(s)).       Radiology Studies last 96 hours: No results found.          LOS: 30 days      Emeterio Reeve, DO [facility] 10/19/2021, 9:19 AM   Staff may message me via secure chat in [place]  but this may not receive immediate response,  please page for urgent matters!  If 7PM-7AM, please contact night-coverage [email or web address]  Dictation software was used to generate the above note. Typos may occur and escape review, as with typed/written notes. Please contact Dr Sheppard Coil directly for clarity if needed.

## 2021-10-19 NOTE — Progress Notes (Addendum)
Attempted abg with this patient. Abg was ordered for tonight not am. Patient would not let me obtain sample. Notified NP E. Ouma.

## 2021-10-19 NOTE — Progress Notes (Signed)
Physical Therapy Treatment Patient Details Name: Gary [name] MRN: [id number] DOB: 1955/08/22 Today's Date: 10/19/2021   History of Present Illness Patient was admitted secondary to SOB, COPD exacerbation, acute metabolic encephalopathy, and acute on chronic hypoxic and hypercapnic respiratory failure in setting of acute decompensated HFpEF and AECOPD requiring intubation and mechanical ventilation. The pt is now s/p tracheostomy and PEG tube placement 11/05/21.    PT Comments    Pt was pleasant and motivated to participate during the session and put forth good effort throughout. Pt able to complete supine to/from sit w/ +3 maxA for trunk control and leg management and physical/verbal cuing throughout; improved sitting balance compared to last session especially w/ bed deflated for firm support. Pt was able to perform 3 sit to stands w/ +3 maxA to initiate stand despite high elevated surface while also providing bilat knee block as well as maxA for steadying. Heavy cuing needed for hand placement and sequencing and once up has a very forward flexed posture and unable to fully come upright. Unable to maintain stand no longer than 57mn or less. Rest breaks needed between each stand, SpO2 and HR WNL throughout with no adverse symptom response reported/observed. Pt will benefit from PT services in a SNF setting upon discharge to safely address deficits listed in patient problem list for decreased caregiver assistance and eventual return to PLOF.    Recommendations for follow up therapy are one component of a multi-disciplinary discharge planning process, led by the attending physician.  Recommendations may be updated based on patient status, additional functional criteria and insurance authorization.  Follow Up Recommendations  Skilled nursing-short term rehab (<3 hours/day) Can patient physically be transported by private vehicle: No   Assistance Recommended at Discharge    Patient can return home  with the following Two people to help with walking and/or transfers;Two people to help with bathing/dressing/bathroom;Assistance with cooking/housework;Assist for transportation;Help with stairs or ramp for entrance   Equipment Recommendations  Other (comment) (TBD)    Recommendations for Other Services       Precautions / Restrictions Precautions Precautions: Fall Precaution Comments: IJ, G tube, trach Restrictions Weight Bearing Restrictions: No     Mobility  Bed Mobility Overal bed mobility: Needs Assistance Bed Mobility: Rolling, Supine to Sit, Sit to Supine     Supine to sit: Max assist (+3) Sit to supine: Max assist (+3)   General bed mobility comments: maxA +3 for for trunk control and leg management; verbal/physical cues needed for sequencing    Transfers Overall transfer level: Needs assistance Equipment used: Rolling walker (2 wheels) Transfers: Sit to/from Stand Sit to Stand: Max assist (+3)          Lateral/Scoot Transfers: From elevated surface General transfer comment: able to complete stands w/ +3 maxA to initiate stand despite high elevated surface and w/ knee block. heavy cuing needed for sequencing and hand placement. very foward flexed posture in standing and unable to come fully upright    Ambulation/Gait               General Gait Details: unable to perform at this time   Stairs             Wheelchair Mobility    Modified Rankin (Stroke Patients Only)       Balance Overall balance assessment: Needs assistance Sitting-balance support: Bilateral upper extremity supported, Feet supported Sitting balance-[name] Scale: Fair     Standing balance support: Bilateral upper extremity supported, Reliant on assistive device for  balance, During functional activity Standing balance-[name] Scale: Poor Standing balance comment: foward flexed posture and unable to come fully upright. heavily reliant on UE support to RW                             Cognition Arousal/Alertness: Awake/alert Behavior During Therapy: WFL for tasks assessed/performed Overall Cognitive Status: No family/caregiver present to determine baseline cognitive functioning                                          Exercises General Exercises - Lower Extremity Ankle Circles/Pumps: Strengthening, Both, 10 reps Quad Sets: Strengthening, Both, 10 reps Gluteal Sets: Strengthening, Both, 10 reps Heel Slides: Strengthening, Both, 10 reps    General Comments        Pertinent Vitals/Pain Pain Assessment Pain Assessment: No/denies pain    Home Living                          Prior Function            PT Goals (current goals can now be found in the care plan section) Progress towards PT goals: Progressing toward goals    Frequency    Min 2X/week      PT Plan Current plan remains appropriate    Co-evaluation              AM-PAC PT "6 Clicks" Mobility   Outcome Measure  Help needed turning from your back to your side while in a flat bed without using bedrails?: A Lot Help needed moving from lying on your back to sitting on the side of a flat bed without using bedrails?: A Lot Help needed moving to and from a bed to a chair (including a wheelchair)?: A Lot Help needed standing up from a chair using your arms (e.g., wheelchair or bedside chair)?: A Lot Help needed to walk in hospital room?: Total Help needed climbing 3-5 steps with a railing? : Total 6 Click Score: 10    End of Session Equipment Utilized During Treatment: Gait belt Activity Tolerance: Patient tolerated treatment well Patient left: in bed;with call bell/phone within reach;with nursing/sitter in room Nurse Communication: Mobility status PT Visit Diagnosis: Unsteadiness on feet (R26.81);Muscle weakness (generalized) (M62.81);Difficulty in walking, not elsewhere classified (R26.2)     Time: 8786-7672 PT Time Calculation (min)  (ACUTE ONLY): 38 min  Charges:                        Turner Daniels, SPT  10/19/2021, 4:16 PM

## 2021-10-19 NOTE — Progress Notes (Signed)
Occupational Therapy Treatment Patient Details Name: Gary [name] MRN: [id number] DOB: 04-Mar-1956 Today's Date: 10/19/2021   History of present illness Patient was admitted 2/2 SOB, COPD exacerbation, acute metabolic encephalopathy, and acute on chronic hypoxic and hypercapnic respiratory failure in setting of acute decompensated HFpEF and AECOPD requiring intubation and mechanical ventilation. The pt is now s/p tracheostomy and PEG tube placement 11/05/21.   OT comments  Upon entering the room, pt supine in bed with PMSV donned. Pt reporting he just finished breakfast. Focus of sessoin on strengthening for functional mobility. Pt demonstrates B ankle pumps x 10 reps. He is unable to lift or move either LE without assistance. Pt demonstrates AROM against gravity for hand ,elbow, and shoulder x 2-3 reps each but fatigues quickly. Pt with limited shoulder elevation on L UE at baseline ~ 90 degrees. Yellow theraband utilized for bicep curls and punches 2 sets of 10. Pt needing increased time for rest breaks with RR increasing to high 30's with activity. Pt is demanding to move off of ICU and therapist reminded him that all the things he currently has connected will remain with him even if on a different unit. Pt showing limited insight and states, " If you all don't move me to another unit, I will put my clothes on and walk out of her". OT reminded pt that it has been taking +2-3 people foor bed mobility and that is body is unable to tolerate some activities at this time. PMSV has been on for some time now and removed for safety at end of session. RN notified.    Recommendations for follow up therapy are one component of a multi-disciplinary discharge planning process, led by the attending physician.  Recommendations may be updated based on patient status, additional functional criteria and insurance authorization.    Follow Up Recommendations  Skilled nursing-short term rehab (<3 hours/day)     Assistance Recommended at Discharge Frequent or constant Supervision/Assistance  Patient can return home with the following  Two people to help with walking and/or transfers;Assistance with cooking/housework;Direct supervision/assist for medications management;Assist for transportation;Two people to help with bathing/dressing/bathroom   Equipment Recommendations  Other (comment) (defer to next venue of care)       Precautions / Restrictions Precautions Precautions: Fall Precaution Comments: IJ, G tube, trach              ADL either performed or assessed with clinical judgement    Extremity/Trunk Assessment Upper Extremity Assessment Upper Extremity Assessment: Generalized weakness LUE Deficits / Details: limited shoulder ROM in which the patient reports is his baseline, ~90 degrees of shoulder flexion.            Vision Patient Visual Report: No change from baseline            Cognition Arousal/Alertness: Awake/alert Behavior During Therapy: Flat affect Overall Cognitive Status: No family/caregiver present to determine baseline cognitive functioning Area of Impairment: Orientation, Awareness, Safety/judgement, Problem solving                 Orientation Level: Disoriented to, Situation, Time       Safety/Judgement: Decreased awareness of safety, Decreased awareness of deficits Awareness: Intellectual Problem Solving: Slow processing General Comments: Pt with limited insight to deficits during session.                   Pertinent Vitals/ Pain       Pain Assessment Pain Assessment: Faces Faces Pain Scale: No hurt  Frequency  Min 2X/week        Progress Toward Goals  OT Goals(current goals can now be found in the care plan section)  Progress towards OT goals: Progressing toward goals  Acute Rehab OT Goals Patient Stated Goal: to return home OT Goal Formulation: With patient Time For Goal Achievement: 10/30/21 Potential to  Achieve Goals: [place] Discharge plan remains appropriate;Frequency remains appropriate       AM-PAC OT "6 Clicks" Daily Activity     Outcome Measure   Help from another person eating meals?: A Little Help from another person taking care of personal grooming?: A Lot Help from another person toileting, which includes using toliet, bedpan, or urinal?: Total Help from another person bathing (including washing, rinsing, drying)?: A Lot Help from another person to put on and taking off regular upper body clothing?: A Little Help from another person to put on and taking off regular lower body clothing?: Total 6 Click Score: 12    End of Session    OT Visit Diagnosis: Other abnormalities of gait and mobility (R26.89);Muscle weakness (generalized) (M62.81)   Activity Tolerance Patient tolerated treatment well   Patient Left in bed;with bed alarm set;with call bell/phone within reach   Nurse Communication Mobility status;Other (comment) (removal of PMSV)        Time: 1000-1029 OT Time Calculation (min): 29 min  Charges: OT General Charges $OT Visit: 1 Visit OT Treatments $Therapeutic Exercise: 23-37 mins  Darleen Crocker, MS, OTR/L , [facility] ascom [phone]  10/19/21, 11:41 AM

## 2021-10-19 NOTE — Progress Notes (Signed)
Speech Language Pathology Treatment: Dysphagia  Patient Details Name: Gary [name] MRN: [id number] DOB: March 10, 1956 Today's Date: 10/19/2021 Time: 4098-1191 SLP Time Calculation (min) (ACUTE ONLY): 75 min  Assessment / Plan / Recommendation Clinical Impression  Pt seen for both PMV and Dysphagia txs today. He required Mod verbal cues and positioning support during session; improved verbal engagement during tx session(no sedating meds given in recent shift per NSG). Pt does exhibit decreased awareness/insight into his medical status and limitations -- often stated he was "going home w/ someone better than a Nurse who can take care of me" and that he had "a room w/ a workout area" for him. He frequent wanted to get out of the bed and wanted help "getting dressed in my clothes" when he has not yet even sat EOB nor stood w/ PT. Attempted redirection each time, but pt stated "everyone has excuses".   Unsure of pt's current insight and mental status. Recommend f/u w/ MD, family in order to best communicate w/ pt and present the current situation. Pt may benefit from a formal Cognitive assessment post Discharge to his next Rehab setting in order to address any needs in setting of ADLs.    PMV TX:  Pt has been wearing the PMV w/ 1:1 Supervision by NSG and tolerating use/wear well during MD/NSG visits and talking via TC w/ family member. NSG has been placing PMV w/ good results, success. Trach: Shiley #7; XLT, cuffed. This session was initiated w/ PMV Tx; wearing of the PMV at baseline for ~20 mins post PMV placement by NSG. Explained the use and wear of the PMV to pt; trach and stoma area inspected; ensured Cuff was deflated. Pt's respiratory effort remained at his baseline throughout w/ no overt increased exertion in Pulmonary status noted when talking, responding to basic questions. RR: 20-23, O2 sats 98-99%, HR upper 80s. FiO2 35% at 8L per chart currently. Cuff deflated at baseline when not wearing the  PMV. Pt exhibited min Dysphonia intermittent c/b decreased volume of speech -- suspect decreased Pulmonary effort in setting of lengthy illness and lying in bed. Decreased breath support and volume impacted verbal intelligibility slightly but pt was able to repeat himself w/  increased volume when asked to. Encouraged pt to continue to wear the PMV maintaining placement during the day w/ Rest Breaks as needed to improve Pulmonary status; use of breathing exs such as Incentive Spirometer per RT.  As PMV remained placed, no overt discomfort noted in his respiratory effort -- pt stated it felt "fine" to wear/talk w/ PMV. No increased effort and no overt use of accessary muscles nor distress was noted in his breathing pattern. Pt spoke to both NSG and to MD while wearing PMV. Educated pt (and NSG) that wearing the PMV exercises pt's lungs, improves lung function. PMV remained on as pt consumed po's during Lunch meal, and after.    Pt appears to adequately tolerate PMV placement w/out overt, gross respiratory discomfort or distress; ANS remained adequately stable at his Baseline during wear/use. Much education was given on PMV use/wear w/ both pt and NSG -- precautions posted in room, MUST have Cuff deflation for PMV wear, checking and removing the air from the balloon b/f placing, placing/removing the PMV, and care of the PMV. Discussed that it MUST be worn for all eating/drinking; and can be worn w/ Therapies(OT, PT) w/ Supervision. Must NOT be worn when sleeping and will be removed when not Supervised (at this time). Precautions posted at bedside  and in chart.  Pt remains w/ a Cuffed trach at this time. Noted Sticker placed on Cuff line, and in room. Pt to wear PMV PRN during waking hours. NSG made aware. MD updated. ST services can be available further PMV needs while admitted if needed.     DYSPHAGIA TX:   Pt consumed trials of ice chips, purees, Mech Soft foods, and thin/Nectar liquids during this session.  Subtle, delayed cough(mild) noted w/ successive sips of thin (and Nectar) liquids; this was NOT noted when Clinician provided PINCHED STRAW as a strategy to limit volume/bolus amount at one time. Instructed/educated pt on taking SINGLE, SMALL sips during remainder of meal. No further overt clinical s/s of aspiration noted; No immediate/delayed, overt cough, no decline in vocal quality; no decline in respiratory status during/post trials. O2 sats remained 98-99%. Pt consumed ~8ozs of thin liquids w/ using the Pinched Straw strategy. Oral phase was adequate for bolus management; oral clearing was appropriate b/t trials/consistencies. Mastication of soft solids was adequate -- boluses were moistened well to aid mastication. Time given for full mastication/clearing w/ sandwich trials d/t the exertion/effort. Educated pt on the need for Time b/t trials to not increase exertion and SOB/WOB d/t pt's Baseline Pulmonary status/decline and reduced Stamina. Pt required support during feeding -- he was able to hold cup for drinking which improves safety of swallow. Pt seemed to enjoy the meal.    D/t pt's overall presentation in setting of current illness w/ recent tracheostomy/PEG/lengthy time on vent support, along w/ min declined mental status, risk for aspiration is increased. Recommend trial upgrade to the dysphagia level 3(mech soft) diet w/ gravies; Thin liquids; aspiration precautions -- PINCHED STRAW to limit bolus volume; reduce Distractions during meals and engage pt at meal for self-feeding as able. REFLUX precs. Pills Whole vs Crushed in Puree for safer swallowing. Support w/ feeding at meals as needed. PT MUST WEAR THE PMV FOR ALL ORAL INTAKE. REST BREAKS DURING MEALS IF NEEDED. MD/NSG updated.    ST services will continue to follow pt for toleration of diet/po trials while admitted for ongoing toleration of oral intake, risk for aspiration, and education as needed. Precautions posted in room. MD/NSG updated.       HPI HPI: Per H&P "66 y/o male with multiple medical problems causing chronic hypercapneic respiratory failure who has had admissions in the past for exacerbations of the same and mechanical ventilation presented for evaluation of dyspnea, weakness. It is unclear if the patient has been compliant with his medications and NIMV at home.  He was admitted by the hospitalists for a presumed COPD exacerbation in the ER, treated with bronchodilators and NIMV but his mental status and hypercarbia worsened despite those interventions so he required intubation.  PCCM consulted for admission.  He could not provide history for [place] because he was encephalopathic.".   Pt intubated 09/19/21, tracheostomy placed 10/03/21, and PEG placed 10/13/21.    PMH includes: diastolic CHF (EF 60 to 83%, grade III DD, 02/2021), COPD on home O2 2 L, HTN, OSA on CPAP, PVD, hyperlipidemia, former smoker, Morbid Obesity with a BMI 49.65, GI bleeding, CKD-3, iron deficiency anemia, who presents with shortness of breath.   Patient has had Multiple hospitalizations in recent months.      SLP Plan  Continue with current plan of care      Recommendations for follow up therapy are one component of a multi-disciplinary discharge planning process, led by the attending physician.  Recommendations may be updated based  on patient status, additional functional criteria and insurance authorization.    Recommendations  Diet recommendations: Dysphagia 3 (mechanical soft);Thin liquid Liquids provided via: Cup;Straw [facility] STRAW to limit) Medication Administration: Whole meds with puree (vs need to crush) Supervision: Staff to assist with self feeding;Full supervision/cueing for compensatory strategies Compensations: Minimize environmental distractions;Slow rate;Small sips/bites;Lingual sweep for clearance of pocketing;Follow solids with liquid Postural Changes and/or Swallow Maneuvers: Out of bed for meals;Seated upright 90 degrees;Upright  30-60 min after meal      Patient may use Passy-Muir Speech Valve: During all therapies with supervision;During PO intake/meals;During all waking hours (remove during sleep) (rest breaks as needed) PMSV Supervision: Intermittent MD: Please consider changing trach tube to :  (tbd)         General recommendations:  (Dietician following for TFs, oral diet) Oral Care Recommendations: Oral care BID;Oral care before and after PO;Staff/trained caregiver to provide oral care (support pt) Follow Up Recommendations: Skilled nursing-short term rehab (<3 hours/day) (TBD; LTACH?) Assistance recommended at discharge: Frequent or constant Supervision/Assistance (feeding support; PMV donning/doffing) SLP Visit Diagnosis: Dysphagia, unspecified (R13.10);Aphonia (R49.1) (presence of Tracheostomy; PEG. Lengthy NPO time while on Vent support) Plan: Continue with current plan of care               Gary Kenner, MS, [place]; [name] [phone] (ascom) Aileen Amore  10/19/2021, 5:18 PM

## 2021-10-20 ENCOUNTER — Inpatient Hospital Stay: Payer: Medicaid Other

## 2021-10-20 DIAGNOSIS — J9622 Acute and chronic respiratory failure with hypercapnia: Secondary | ICD-10-CM | POA: Diagnosis not present

## 2021-10-20 DIAGNOSIS — J9621 Acute and chronic respiratory failure with hypoxia: Secondary | ICD-10-CM | POA: Diagnosis not present

## 2021-10-20 LAB — CBC WITH DIFFERENTIAL/PLATELET
Abs Immature Granulocytes: 0.06 10*3/uL (ref 0.00–0.07)
Basophils Absolute: 0 10*3/uL (ref 0.0–0.1)
Basophils Relative: 1 %
Eosinophils Absolute: 0.2 10*3/uL (ref 0.0–0.5)
Eosinophils Relative: 4 %
HCT: 33.2 % — ABNORMAL LOW (ref 39.0–52.0)
Hemoglobin: 9.1 g/dL — ABNORMAL LOW (ref 13.0–17.0)
Immature Granulocytes: 1 %
Lymphocytes Relative: 13 %
Lymphs Abs: 0.8 10*3/uL (ref 0.7–4.0)
MCH: 22.6 pg — ABNORMAL LOW (ref 26.0–34.0)
MCHC: 27.4 g/dL — ABNORMAL LOW (ref 30.0–36.0)
MCV: 82.6 fL (ref 80.0–100.0)
Monocytes Absolute: 0.7 10*3/uL (ref 0.1–1.0)
Monocytes Relative: 11 %
Neutro Abs: 4.5 10*3/uL (ref 1.7–7.7)
Neutrophils Relative %: 70 %
Platelets: 232 10*3/uL (ref 150–400)
RBC: 4.02 MIL/uL — ABNORMAL LOW (ref 4.22–5.81)
RDW: 17.5 % — ABNORMAL HIGH (ref 11.5–15.5)
WBC: 6.3 10*3/uL (ref 4.0–10.5)
nRBC: 0.5 % — ABNORMAL HIGH (ref 0.0–0.2)

## 2021-10-20 LAB — RENAL FUNCTION PANEL
Albumin: 2.5 g/dL — ABNORMAL LOW (ref 3.5–5.0)
Anion gap: 5 (ref 5–15)
BUN: 65 mg/dL — ABNORMAL HIGH (ref 8–23)
CO2: 28 mmol/L (ref 22–32)
Calcium: 8.6 mg/dL — ABNORMAL LOW (ref 8.9–10.3)
Chloride: 113 mmol/L — ABNORMAL HIGH (ref 98–111)
Creatinine, Ser: 1.38 mg/dL — ABNORMAL HIGH (ref 0.61–1.24)
GFR, Estimated: 57 mL/min — ABNORMAL LOW (ref >60)
Glucose, Bld: 92 mg/dL (ref 70–99)
Phosphorus: 2 mg/dL — ABNORMAL LOW (ref 2.5–4.6)
Potassium: 3.2 mmol/L — ABNORMAL LOW (ref 3.5–5.1)
Sodium: 146 mmol/L — ABNORMAL HIGH (ref 135–145)

## 2021-10-20 LAB — BASIC METABOLIC PANEL
Anion gap: 3 — ABNORMAL LOW (ref 5–15)
BUN: 67 mg/dL — ABNORMAL HIGH (ref 8–23)
CO2: 30 mmol/L (ref 22–32)
Calcium: 8.6 mg/dL — ABNORMAL LOW (ref 8.9–10.3)
Chloride: 115 mmol/L — ABNORMAL HIGH (ref 98–111)
Creatinine, Ser: 1.4 mg/dL — ABNORMAL HIGH (ref 0.61–1.24)
GFR, Estimated: 56 mL/min — ABNORMAL LOW (ref >60)
Glucose, Bld: 93 mg/dL (ref 70–99)
Potassium: 3.3 mmol/L — ABNORMAL LOW (ref 3.5–5.1)
Sodium: 148 mmol/L — ABNORMAL HIGH (ref 135–145)

## 2021-10-20 LAB — GLUCOSE, CAPILLARY
Glucose-Capillary: 100 mg/dL — ABNORMAL HIGH (ref 70–99)
Glucose-Capillary: 100 mg/dL — ABNORMAL HIGH (ref 70–99)
Glucose-Capillary: 112 mg/dL — ABNORMAL HIGH (ref 70–99)
Glucose-Capillary: 112 mg/dL — ABNORMAL HIGH (ref 70–99)
Glucose-Capillary: 87 mg/dL (ref 70–99)
Glucose-Capillary: 97 mg/dL (ref 70–99)

## 2021-10-20 LAB — MAGNESIUM: Magnesium: 1.8 mg/dL (ref 1.7–2.4)

## 2021-10-20 MED ORDER — LORAZEPAM 2 MG/ML IJ SOLN
2.0000 mg | Freq: Once | INTRAMUSCULAR | Status: AC
  Administered 2021-10-20: 2 mg via INTRAVENOUS

## 2021-10-20 MED ORDER — POTASSIUM CHLORIDE 20 MEQ PO PACK
40.0000 meq | PACK | Freq: Once | ORAL | Status: AC
  Administered 2021-10-20: 40 meq
  Filled 2021-10-20: qty 2

## 2021-10-20 MED ORDER — LOPERAMIDE HCL 2 MG PO CAPS
4.0000 mg | ORAL_CAPSULE | ORAL | Status: DC | PRN
  Administered 2021-10-21 – 2021-12-05 (×4): 4 mg via ORAL
  Filled 2021-10-20 (×5): qty 2

## 2021-10-20 MED ORDER — MAGNESIUM SULFATE 2 GM/50ML IV SOLN
2.0000 g | Freq: Once | INTRAVENOUS | Status: AC
  Administered 2021-10-20: 2 g via INTRAVENOUS
  Filled 2021-10-20: qty 50

## 2021-10-20 MED ORDER — LORAZEPAM 2 MG/ML IJ SOLN
INTRAMUSCULAR | Status: AC
  Filled 2021-10-20: qty 1

## 2021-10-20 MED ORDER — POTASSIUM & SODIUM PHOSPHATES 280-160-250 MG PO PACK
1.0000 | PACK | Freq: Three times a day (TID) | ORAL | Status: AC
  Administered 2021-10-20 (×3): 1
  Filled 2021-10-20 (×3): qty 1

## 2021-10-20 NOTE — Progress Notes (Addendum)
PROGRESS NOTE    Gary [name]  YNW:[id number] DOB: Mar 12, 1956  DOA: 09/19/2021 Date of Service: 10/20/21 PCP: [facility] Course:  66 y/o male with multiple medical problems causing chronic hypercapneic respiratory failure who has had admissions in the past for exacerbations of the same and mechanical ventilation presented for evaluation of dyspnea, weakness. It is unclear if the patient has been compliant with his medications and NIMV at home.  He was admitted by the hospitalists for a presumed COPD exacerbation in the ER, treated with bronchodilators and NIMV but his mental status and hypercarbia worsened despite those interventions so he required intubation.   6/6: Presented to ED.  Required intubation and mechanical ventilation in the ED.  PCCM admit ICU 6/7: Hold diuresis due to worsening Creatinine.  ENT consulted for Trach placement per family request 6/8: Pt continues to spike temps tmax 101.8 degrees F.  Flexiseal placed overnight draining foul smelling watery stool~Cdiff and GI panel pending  6/9: Fever resolved.  Negative for C- diff, GI panel still pending.  Creatinine slightly worsened, requiring low dose levophed (2 mcg). Holding diuresis 6/10: propofol discontinued due hypertriglyceridemia; failed SBT 6/11: Tracheal aspirate from 6/6 resulting with Pseudomonas, ABX changed to Cefepime. neurologically intact, awaiting Trach placement 6/12: Pt calmer today following addition of oral benzo's, narcotics, and Seroquel via tube.  Creatinine worsened with decreased UOP, holding diuresis, low threshold for Nephrology consult  6/13: Pt with worsening renal failure creatinine 4.15.  UOP overnight 25 ml.  Nephrology consulted plans to start pt on hemodialysis follow dialysis catheter placement Pt underwent hemodialysis without ultrafiltration 06/14: .  Plans for HD session today.  Agitation has improved significantly with narcotics and antianxiety medications  per tube   06/16: Tracheostomy canceled 06/15 due to hyperkalemia and tentatively rescheduled for 06/20  6/18 failure to wean from vent 06/20: ENT placed Tracheostomy (#7.0 XLT cuffed).  Developed mild epistaxis post unsuccessful attempt at NGT placement.  Later developed hematochezia/melena, Protonix gtt started and GI consulted. 06/21: Diuresed yesterday with limited results. Patient had softed blood pressures throughout the day and diuresis was held as a result. HD was performed by the patient clotted off the machine and was not able to be rinsed back. Repeat hemoglobin is above 8. 06/23: Pt remains mechanically intubated settings: FiO2 50%/PEEP 10.  CXR concerning for moderate right pleural effusion with associated right basilar atelectasis vs. infiltrate.  Will perform [place] Chest to determine if pt needs right sided thoracentesis  06/24: Remains on the ventilator, FiO2 requirements decreasing, respiratory culture with abundant gram-negative rods: [name], on Meropenem 06/25: Oxygen requirements down to 40%, chest x-ray shows improvement on dense right consolidation, Pseudomonas susceptibilities pending 06/25: Pt remains on the ventilator settings: PEEP 10 and FiO2 40%; will attempt to wean PEEP to 5 to perform SBT  06/28: Pt tolerated PS on 06/27.  Overnight pt had episodes of vomiting TF's suspect secondary to malposition of nasogastric tube.  Will consult IR for PEG tube placement if unable to place PEG tube pt will need postpyloric Dobbhoff   06/29: Pt tolerated PS 5/5 for over an hour yesterday, however due to fatigue placed back on PRVC.  Pt pulled NGT out several times overnight, therefore will not attempt to reinsert.  Plans for PEG tube placement per IR on 06/30.   06/30: PEG tube placed per IR  07/1: Pt tolerated trach collar '@40'$ % throughout the day 07/2: Pt placed on ventilator overnight settings 16/5/35% tolerating well.  Will place pt on trach collar today and place orders  for PT/OT 7/3: Hemodynamically stable.  Tolerating TC (has remained on TC since 7/2 @ 08:00), plan for speech therapy and PT/OT 7/4: Has maintained on trach collar for greater than 48 hours, tolerating Passy-Muir valve.  Plan to transfer out of ICU. SLP recs: dysphagia 1 (puree) w/ nectar liquid.  7/5-7/6: sodium and creatinine trending appropriately, Hgb stable. Low K repleted and stable. Was off vent qhs (orders d/c or missed?) and was restarted. Will need LTAC, TOC following  7/7: pulled out trach overnight. Per RRT notes: PCCU attending Dr Mortimer Fries and NP Stark Klein, remain off vent last night. Pt declining ABG's. Per NP Ouma note: pt asked to be put on vent, Dr Mortimer Fries recommended could be off vent or van get ABG to assess vent needs. Pt declining ABG. Per RRT note, pt pulled out trach. Per RN note, pt agitated and swinging at staff as they try to replace trach, required Ativan, unable to locate a Passy-Muir valve. Today, pt seems to not be able to communicate what he feels more comfortable with in terms of symptoms, per night NP he was under the impression the trach had "cured" his OSA. I've asked PCCM to reevluate him and verify plan so the team and the patient can eb on the same page, and will help TOC arrange appropriate placement. He is approaching medically stable and may be appropriate for LTAC vs SNF soon   Consultants:  PCCU Nephrology ENT GI IR  Procedures: 10/03/21: tracheostomy placement 10/12/21: dialysis permacath placement  10/13/21: G-tube placed    Subjective: Patient reports feeling tired, no SOB/cough, no CP. He is not very talkative, but is oriented.      ASSESSMENT & PLAN:   Principal Problem:   Acute on chronic respiratory failure with hypoxia and hypercapnia ([name]) Active Problems:   Acute on chronic diastolic congestive heart failure ([name])   COPD with acute exacerbation ([name])   Acute metabolic encephalopathy   HTN (hypertension)   Obesity, Class III, BMI 40-49.9  (morbid obesity) ([place])   Obstructive sleep apnea   Chronic kidney disease, stage 3a ([name])   HLD (hyperlipidemia)   Pressure injury of skin   No problem-specific Assessment & Plan notes found for this encounter.  Acute on Chronic Hypoxic & Hypercapnic Respiratory Failure in the setting of acute decompensated HFpEF, AECOPD, Pseudomonas Pneumonia, & OSA/OHS Moderate right sided pleural effusion -ruled out, actually lung CONSOLIDATION -S/p  tracheostomy by ENT by 6/20 -Follow intermittent Chest X-ray & ABG as needed -Trach collar as needed / qhs -VAP bundle implemented  -Bronchodilators & Pulmicort nebs -Completed 10 day course of ceftazidime for treatment  of Pseudomonas -secure chatted PCCM attending to weigh in and clarify plan for vent/trach support    Acute decompensated HFpEF PMHx: Hypertension  Echocardiogram 08/07/2021: LVEF 60 to 78%, grade 1 diastolic dysfunction, normal RV systolic function -Continuous cardiac monitoring -Maintain MAP >65 -On intermittent hemodialysis for volume removal - has not needed HD and has remained compensated    AKI superimposed CKD stage IIIa  Hypokalemia  Hypernatremia During admissions in February 2023, creatinine 1.8 with GFR 40; in May 2023 creatinine 1.1 with GFR of 60 -Monitor I&O's / urinary output -Follow BMP -Ensure adequate renal perfusion -Avoid nephrotoxic agents as able -Replace electrolytes as indicated -Pharmacy following for assistance with electrolyte replacement -Nephrology consulted appreciate input: HD per nephrology. -Renal Ultrasound showed mild right obstruction , simple cyst in  left kidney and bladder distendion.   -  Cr down, good UOP noted, d/c dialysis catheter at this time, monitor renal parameters daily.  -Increase free water flushes 7/4 -No further dialysis needed per nephro, see note 10/18/21    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~RESOLVED Pseudomonas Pneumonia ~persistent, initial ABX course short - on  meropenem ~ TREATED -Monitor fever curve -Trend WBC's -Completed 10 day course of ceftazidime (started on 06/26) -C. Diff negative   Acute Blood Loss Anemia~RESOLVED Nasopharyngeal trauma vs Upper GI bleed -Trend CBC  -VTE px: subq heparin and SCD's  -Monitor for s/sx of bleeding and transfuse for Hgb <7 -Continue BID IV PPI -Consulted GI, no need for scope  -nephrology recommends consider epogen as outpatient    Hyperglycemia -CBG's q4h; Target range of 140 to 180 -SSI -Follow ICU Hypo/Hyperglycemia protocol  Acute Metabolic Encephalopathy in setting of CO2 Narcosis Sedation needs in setting of mechanical ventilation~RESOLVED  CT Head 09/19/21: negative;  UDS negative -Treatment of hypercapnia as outlined above -Maintain a RASS goal of 0  -Continue oxycodone and klonopin prn due to agitation/delirium but limit these if possible  Trach in place Gtube in place Requiring ventilatory support at night Working on increasing po intake, SLP following w/ some concern for mild aspiration CXR in AM tomorrow 07/07 will have been onthin liquids 48 hours at that point, if any signs aspiration will need to back off on po    DVT prophylaxis: SCD and subcutaneous heparin Code Status: FULL Family Communication: spoke to sister today, will have family meeting tomorrow  Disposition Plan / TOC needs: out of ICU and onto progressive unit 10/18/21, PT/OT following, may be able to transfer to medsurg but need to know if they'll take patietn w/ trach/vent qhs  Barriers to discharge / significant pending items: remains on tube feeds, diet advancing, remains on trach - may need LTAC,             Objective: Vitals:   10/20/21 0500 10/20/21 0700 10/20/21 0800 10/20/21 0900  BP: 130/89 121/77 119/74   Pulse: 99 87 88 93  Resp: (!) 21 (!) 23 (!) 22 20  Temp: 98.8 F (37.1 C)     TempSrc: Oral     SpO2: 100% 92% 93% 97%  Weight:      Height:        Intake/Output Summary (Last 24  hours) at 10/20/2021 0956 Last data filed at 10/20/2021 0230 Gross per 24 hour  Intake 635.97 ml  Output 1100 ml  Net -464.03 ml    Filed Weights   10/13/21 0500 10/14/21 0800 10/18/21 0330  Weight: (!) 168 kg (!) 180 kg (!) 169 kg    Examination:  Constitutional:  VS as above General Appearance: alert, well-developed, well-nourished, NAD Eyes: Normal lids and conjunctive, non-icteric sclera Ears, Nose, Mouth, Throat: Normal appearance Neck: No masses, trachea midline Trach in place, appears clean and ventilating well  No thyroid enlargement/tenderness/mass appreciated Respiratory: Normal respiratory effort Breath sounds normal, no wheeze/rhonchi/rales Cardiovascular: S1/S2 normal, no murmur/rub/gallop auscultated Trace lower extremity edema Gastrointestinal: Nontender, no masses but habitus limits exam  Musculoskeletal:  No clubbing/cyanosis of digits Neurological: No cranial nerve deficit on limited exa Psychiatric: Normal judgment/insight mood and affect today more talkative, he is frustrated, he is motivated to work w/ PT/OT        Scheduled Medications:   atorvastatin  10 mg Per Tube Daily   budesonide (PULMICORT) nebulizer solution  0.5 mg Nebulization BID   Chlorhexidine Gluconate Cloth  6 each Topical Daily   cholecalciferol  2,000 Units Per Tube Daily   epoetin (EPOGEN/PROCRIT) injection  20,000 Units Subcutaneous Weekly   feeding supplement (PROSource TF)  45 mL Per Tube QID   free water  200 mL Per Tube Q4H   heparin injection (subcutaneous)  5,000 Units Subcutaneous Q8H   heparin sodium (porcine)  2,000 Units Intravenous Q M,W,F-HD   ipratropium-albuterol  3 mL Nebulization BID   multivitamin  1 tablet Per Tube QHS   nutrition supplement (JUVEN)  1 packet Per Tube BID BM   mouth rinse  15 mL Mouth Rinse 4 times per day   pantoprazole sodium  40 mg Per Tube BID   potassium & sodium phosphates  1 packet Per Tube TID WC & HS   potassium chloride  40  mEq Per Tube Once   traZODone  50 mg Oral QHS    Continuous Infusions:  sodium chloride Stopped (10/07/21 0542)   feeding supplement (NEPRO CARB STEADY) Stopped (10/20/21 0000)   magnesium sulfate bolus IVPB      PRN Medications:  acetaminophen, acetaminophen, albuterol, alteplase, bisacodyl, clonazepam, heparin, HYDROmorphone (DILAUDID) injection, mouth rinse, oxyCODONE, pentafluoroprop-tetrafluoroeth, polyethylene glycol, QUEtiapine  Antimicrobials:  Anti-infectives (From admission, onward)    Start     Dose/Rate Route Frequency Ordered Stop   10/12/21 1200  cefTAZidime (FORTAZ) 2 g in sodium chloride 0.9 % 100 mL IVPB        2 g 200 mL/hr over 30 Minutes Intravenous 2 times daily 10/12/21 1122 10/17/21 0844   10/12/21 0417  ceFAZolin (ANCEF) IVPB 1 g/50 mL premix  Status:  Discontinued        1 g 100 mL/hr over 30 Minutes Intravenous 30 min pre-op 10/12/21 0417 10/16/21 0751   10/09/21 2000  ceFEPIme (MAXIPIME) 1 g in sodium chloride 0.9 % 100 mL IVPB  Status:  Discontinued        1 g 200 mL/hr over 30 Minutes Intravenous Every 24 hours 10/09/21 1404 10/09/21 1515   10/09/21 2000  cefTAZidime (FORTAZ) 1 g in sodium chloride 0.9 % 100 mL IVPB  Status:  Discontinued        1 g 200 mL/hr over 30 Minutes Intravenous Every 24 hours 10/09/21 1515 10/12/21 1122   10/06/21 1830  meropenem (MERREM) 500 mg in sodium chloride 0.9 % 100 mL IVPB  Status:  Discontinued        500 mg 200 mL/hr over 30 Minutes Intravenous Every 24 hours 10/06/21 1735 10/09/21 1403   10/06/21 1800  doxycycline (VIBRAMYCIN) 100 mg in sodium chloride 0.9 % 250 mL IVPB  Status:  Discontinued        100 mg 125 mL/hr over 120 Minutes Intravenous Every 12 hours 10/06/21 1657 10/08/21 0838   09/29/21 1000  ceFEPIme (MAXIPIME) 2 g in sodium chloride 0.9 % 100 mL IVPB        2 g 200 mL/hr over 30 Minutes Intravenous Every 12 hours 09/29/21 0812 09/30/21 2342   09/26/21 2200  ceFEPIme (MAXIPIME) 1 g in sodium chloride  0.9 % 100 mL IVPB  Status:  Discontinued        1 g 200 mL/hr over 30 Minutes Intravenous Every 24 hours 09/26/21 1154 09/29/21 0812   09/26/21 2000  ceFEPIme (MAXIPIME) 2 g in sodium chloride 0.9 % 100 mL IVPB  Status:  Discontinued        2 g 200 mL/hr over 30 Minutes Intravenous Every 12 hours 09/26/21 0809 09/26/21 1154   09/24/21 1200  ceFEPIme (MAXIPIME) 2 g  in sodium chloride 0.9 % 100 mL IVPB  Status:  Discontinued        2 g 200 mL/hr over 30 Minutes Intravenous Every 8 hours 09/24/21 0950 09/26/21 0809   09/21/21 1430  azithromycin (ZITHROMAX) 500 mg in sodium chloride 0.9 % 250 mL IVPB        500 mg 250 mL/hr over 60 Minutes Intravenous Every 24 hours 09/21/21 1344 09/23/21 1543   09/21/21 1430  cefTRIAXone (ROCEPHIN) 2 g in sodium chloride 0.9 % 100 mL IVPB  Status:  Discontinued        2 g 200 mL/hr over 30 Minutes Intravenous Every 24 hours 09/21/21 1344 09/24/21 0922       Data Reviewed: I have personally reviewed following labs and imaging studies  CBC: Recent Labs  Lab 10/16/21 0321 10/17/21 0418 10/18/21 0456 10/19/21 0328 10/20/21 0426  WBC 6.7 6.5 6.3 6.2 6.3  NEUTROABS 4.1 3.8 3.1 3.1 4.5  HGB 8.7* 8.6* 8.8* 8.5* 9.1*  HCT 31.5* 31.2* 31.6* 30.9* 33.2*  MCV 81.0 81.7 81.9 82.2 82.6  PLT 297 213 262 269 528    Basic Metabolic Panel: Recent Labs  Lab 10/16/21 0321 10/17/21 0418 10/18/21 0456 10/19/21 0328 10/20/21 0426  NA 149* 151* 149* 147* 146*  148*  K 3.2* 3.4* 3.0* 3.8 3.2*  3.3*  CL 115* 120* 116* 113* 113*  115*  CO2 '28 28 29 29 28  30  '$ GLUCOSE 98 113* 100* 95 92  93  BUN 74* 82* 69* 69* 65*  67*  CREATININE 2.19* 1.93* 1.69* 1.59* 1.38*  1.40*  CALCIUM 8.5* 8.5* 8.5* 8.3* 8.6*  8.6*  MG 1.8 2.0 1.9 1.7 1.8  PHOS 3.1 2.4* 2.0* 2.4* 2.0*    GFR: Estimated Creatinine Clearance: 90.5 mL/min (A) (by C-G formula based on SCr of 1.38 mg/dL (H)). Liver Function Tests: Recent Labs  Lab 10/16/21 0321 10/17/21 0418 10/18/21 0456  10/19/21 0328 10/20/21 0426  ALBUMIN 2.3* 2.3* 2.3* 2.4* 2.5*    No results for input(s): "LIPASE", "AMYLASE" in the last 168 hours. No results for input(s): "AMMONIA" in the last 168 hours. Coagulation Profile: No results for input(s): "INR", "PROTIME" in the last 168 hours. Cardiac Enzymes: No results for input(s): "CKTOTAL", "CKMB", "CKMBINDEX", "TROPONINI" in the last 168 hours. BNP (last 3 results) No results for input(s): "PROBNP" in the last 8760 hours. HbA1C: No results for input(s): "HGBA1C" in the last 72 hours. CBG: Recent Labs  Lab 10/19/21 1632 10/19/21 1933 10/19/21 2319 10/20/21 0559 10/20/21 0722  GLUCAP 108* 95 104* 97 100*    Lipid Profile: No results for input(s): "CHOL", "HDL", "LDLCALC", "TRIG", "CHOLHDL", "LDLDIRECT" in the last 72 hours. Thyroid Function Tests: No results for input(s): "TSH", "T4TOTAL", "FREET4", "T3FREE", "THYROIDAB" in the last 72 hours. Anemia Panel: No results for input(s): "VITAMINB12", "FOLATE", "FERRITIN", "TIBC", "IRON", "RETICCTPCT" in the last 72 hours. Urine analysis:    Component Value Date/Time   COLORURINE STRAW (A) 09/19/2021 1247   APPEARANCEUR CLEAR (A) 09/19/2021 1247   APPEARANCEUR Clear 04/15/2014 1240   LABSPEC 1.009 09/19/2021 1247   LABSPEC 1.013 04/15/2014 1240   PHURINE 5.0 09/19/2021 1247   GLUCOSEU 150 (A) 09/19/2021 1247   GLUCOSEU Negative 04/15/2014 1240   HGBUR NEGATIVE 09/19/2021 1247   BILIRUBINUR NEGATIVE 09/19/2021 1247   BILIRUBINUR Negative 04/15/2014 [place] 09/19/2021 1247   PROTEINUR NEGATIVE 09/19/2021 1247   NITRITE NEGATIVE 09/19/2021 1247   LEUKOCYTESUR NEGATIVE 09/19/2021 1247   LEUKOCYTESUR Negative  04/15/2014 1240   Sepsis Labs: '@LABRCNTIP'$ (procalcitonin:4,lacticidven:4)  No results found for this or any previous visit (from the past 240 hour(s)).       Radiology Studies last 96 hours: DG Chest Port 1 View  Result Date: 10/20/2021 CLINICAL DATA:   Pulmonary edema EXAM: PORTABLE CHEST 1 VIEW COMPARISON:  10/13/2021 FINDINGS: Tracheostomy tube in place. Cardiomegaly and vascular pedicle widening with vascular congestion. Some atelectasis is likely superimposed. Remote left rib fractures. No visible effusion or pneumothorax. Lung volumes are low. IMPRESSION: Cardiomegaly and vascular congestion similar to prior. Electronically Signed   By: Jorje Guild M.D.   On: 10/20/2021 05:44            LOS: 31 days      Emeterio Reeve, DO [facility] 10/20/2021, 9:56 AM   Staff may message me via secure chat in [place]  but this may not receive immediate response,  please page for urgent matters!  If 7PM-7AM, please contact night-coverage [email or web address]  Dictation software was used to generate the above note. Typos may occur and escape review, as with typed/written notes. Please contact Dr Sheppard Coil directly for clarity if needed.

## 2021-10-20 NOTE — Progress Notes (Addendum)
This RN at bedside to perform physical exam of the patient. The patient asks "Who the fuck are you?". This RN reminds the patient "My name is Aleene Davidson and I will be the nurse caring you tonight". The patient asks this RN "Where the fuck have you been?". This RN responds with "I am here to help you now; what can I do for you?". The patients states "Do not come in here with that bullshit. Get the fuck out of my room". This RN obliges to the patient's demand and notifies the charge RN, Danae Chen, of the exchange.

## 2021-10-20 NOTE — Progress Notes (Signed)
2054 hrs: Text page sent to Dr. Damita Dunnings RE patient's frequent loose stools.  2100 hrs: Orders are entered by Dr. Damita Dunnings, and noted by this RN, for enteric isolation status, a lab order for a stool PCR, and for loperamide.

## 2021-10-20 NOTE — Progress Notes (Signed)
[facility]  ROUNDING NOTE   Subjective:   Gary [name] is a 65 year old male with past medical conditions including COPD requiring 2 L nasal cannula, and congestive heart failure.  Patient presents to the emergency department with shortness of breath.  Patient has been admitted for COPD exacerbation ([place]) [J44.1] Obesity hypoventilation syndrome ([place]) [E66.2] Acute on chronic diastolic congestive heart failure ([name]) [I50.33] Acute respiratory failure with hypoxia and hypercapnia ([name]) [J96.01, J96.02] Acute on chronic respiratory failure with hypoxia and hypercapnia ([name]) [W73.71, J96.22]  Patient is known and has recently been followed by Dr. Holley Raring for acute kidney injury, resolved at his May appointment.    Update: Patient seen and evaluated at bedside in ICU Alert, oriented, able to answer simple questions Remains on trach collar 35%, did not require ventilator support overnight.  Per RT note, patient pulled out trach overnight and became combative when staff attempted to replace. Tube feeds continue through PEG tube  No lower extremity edema Adequate urine output recorded   07/06 0701 - 07/07 0700 In: 772 [NG/GT:722; IV Piggyback:50] Out: 1600 [Urine:1600] Lab Results  Component Value Date   CREATININE 1.38 (H) 10/20/2021   CREATININE 1.40 (H) 10/20/2021   CREATININE 1.59 (H) 10/19/2021     Objective:  Vital signs in last 24 hours:  Temp:  [98.7 F (37.1 C)-99 F (37.2 C)] 98.8 F (37.1 C) (07/07 0500) Pulse Rate:  [83-99] 93 (07/07 0900) Resp:  [18-35] 20 (07/07 0900) BP: (109-134)/(63-89) 119/74 (07/07 0800) SpO2:  [90 %-100 %] 97 % (07/07 0900) FiO2 (%):  [35 %] 35 % (07/07 0900)  Weight change:  Filed Weights   10/13/21 0500 10/14/21 0800 10/18/21 0330  Weight: (!) 168 kg (!) 180 kg (!) 169 kg    Intake/Output: I/O last 3 completed shifts: In: 2542 [NG/GT:2492; IV Piggyback:50] Out: 1600 [Urine:1600]   Intake/Output this shift:  No  intake/output data recorded.  Physical Exam: General: NAD  Head: Normocephalic, atraumatic.   Eyes: Anicteric  Lungs:  Diminished bases, Trach 35% FiO2  Heart: Regular rhythm  Abdomen:  Soft, nontender, obese, PEG tube in place  Extremities: No peripheral edema.  Neurologic: Alert, respond to simple questions  Skin: No lesions  Access: None    Basic Metabolic Panel: Recent Labs  Lab 10/16/21 0321 10/17/21 0418 10/18/21 0456 10/19/21 0328 10/20/21 0426  NA 149* 151* 149* 147* 146*  148*  K 3.2* 3.4* 3.0* 3.8 3.2*  3.3*  CL 115* 120* 116* 113* 113*  115*  CO2 '28 28 29 29 28  30  '$ GLUCOSE 98 113* 100* 95 92  93  BUN 74* 82* 69* 69* 65*  67*  CREATININE 2.19* 1.93* 1.69* 1.59* 1.38*  1.40*  CALCIUM 8.5* 8.5* 8.5* 8.3* 8.6*  8.6*  MG 1.8 2.0 1.9 1.7 1.8  PHOS 3.1 2.4* 2.0* 2.4* 2.0*     Liver Function Tests: Recent Labs  Lab 10/16/21 0321 10/17/21 0418 10/18/21 0456 10/19/21 0328 10/20/21 0426  ALBUMIN 2.3* 2.3* 2.3* 2.4* 2.5*    No results for input(s): "LIPASE", "AMYLASE" in the last 168 hours. No results for input(s): "AMMONIA" in the last 168 hours.  CBC: Recent Labs  Lab 10/16/21 0321 10/17/21 0418 10/18/21 0456 10/19/21 0328 10/20/21 0426  WBC 6.7 6.5 6.3 6.2 6.3  NEUTROABS 4.1 3.8 3.1 3.1 4.5  HGB 8.7* 8.6* 8.8* 8.5* 9.1*  HCT 31.5* 31.2* 31.6* 30.9* 33.2*  MCV 81.0 81.7 81.9 82.2 82.6  PLT 297 213 262 269 232  Cardiac Enzymes: No results for input(s): "CKTOTAL", "CKMB", "CKMBINDEX", "TROPONINI" in the last 168 hours.  BNP: Invalid input(s): "POCBNP"  CBG: Recent Labs  Lab 10/19/21 1632 10/19/21 1933 10/19/21 2319 10/20/21 0559 10/20/21 0722  GLUCAP 108* 95 104* 97 100*     Microbiology: Results for orders placed or performed during the hospital encounter of 09/19/21  SARS Coronavirus 2 by RT PCR (hospital order, performed in [facility] hospital lab) *cepheid single result test* Anterior Nasal Swab     Status: None    Collection Time: 09/19/21  9:57 AM   Specimen: Anterior Nasal Swab  Result Value Ref Range Status   SARS Coronavirus 2 by RT PCR NEGATIVE NEGATIVE Final    Comment: (NOTE) SARS-CoV-2 target nucleic acids are NOT DETECTED.  The SARS-CoV-2 RNA is generally detectable in upper and lower respiratory specimens during the acute phase of infection. The lowest concentration of SARS-CoV-2 viral copies this assay can detect is 250 copies / mL. A negative result does not preclude SARS-CoV-2 infection and should not be used as the sole basis for treatment or other patient management decisions.  A negative result may occur with improper specimen collection / handling, submission of specimen other than nasopharyngeal swab, presence of viral mutation(s) within the areas targeted by this assay, and inadequate number of viral copies (<250 copies / mL). A negative result must be combined with clinical observations, patient history, and epidemiological information.  Fact Sheet for Patients:   https://www.patel.info/  Fact Sheet for Healthcare Providers: https://hall.com/  This test is not yet approved or  cleared by the [place] FDA and has been authorized for detection and/or diagnosis of SARS-CoV-2 by FDA under an Emergency Use Authorization (EUA).  This EUA will remain in effect (meaning this test can be used) for the duration of the COVID-19 declaration under Section 564(b)(1) of the Act, 21 U.S.C. section 360bbb-3(b)(1), unless the authorization is terminated or revoked sooner.  Performed at [facility], [address] [place], [place] [address]   Culture, Respiratory w Gram Stain     Status: None   Collection Time: 09/19/21 11:55 AM   Specimen: SPU  Result Value Ref Range Status   Specimen Description   Final    SPUTUM Performed at [facility], [place] [address], [place], [place] [address]    Special Requests   Final     NONE Performed at [facility], [place], [place] [address]    Gram Stain   Final    FEW WBC PRESENT, PREDOMINANTLY PMN FEW GRAM POSITIVE COCCI IN PAIRS Performed at [facility], [place] [address] [place], [place] [address]    Culture RARE [name]  Final   Report Status 09/24/2021 FINAL  Final   Organism ID, Bacteria [name]  Final      Susceptibility   [name] - MIC*    CEFTAZIDIME 4 SENSITIVE Sensitive     CIPROFLOXACIN <=0.25 SENSITIVE Sensitive     GENTAMICIN 2 SENSITIVE Sensitive     IMIPENEM 1 SENSITIVE Sensitive     PIP/TAZO 8 SENSITIVE Sensitive     CEFEPIME 2 SENSITIVE Sensitive     * RARE [name]  MRSA Next Gen by PCR, Nasal     Status: None   Collection Time: 09/19/21  6:10 PM   Specimen: Nasal Mucosa; Nasal Swab  Result Value Ref Range Status   MRSA by PCR Next Gen NOT DETECTED NOT DETECTED Final  Comment: (NOTE) The GeneXpert MRSA Assay (FDA approved for NASAL specimens only), is one component of a comprehensive MRSA colonization surveillance program. It is not intended to diagnose MRSA infection nor to guide or monitor treatment for MRSA infections. Test performance is not FDA approved in patients less than 75 years old. Performed at [facility], [address] [place], [place] [address]   Gastrointestinal Panel by PCR , Stool     Status: Abnormal   Collection Time: 09/21/21 10:53 AM   Specimen: Stool  Result Value Ref Range Status   Campylobacter species NOT DETECTED NOT DETECTED Final   Plesimonas shigelloides NOT DETECTED NOT DETECTED Final   Salmonella species NOT DETECTED NOT DETECTED Final   Yersinia enterocolitica DETECTED (A) NOT DETECTED Final    Comment: RESULT CALLED TO, READ BACK BY AND VERIFIED WITH: [facility] JACKSON AT 1126 09/22/21.PMF    Vibrio species NOT DETECTED NOT DETECTED Final   Vibrio cholerae NOT DETECTED NOT DETECTED Final    Enteroaggregative E coli (EAEC) NOT DETECTED NOT DETECTED Final   Enteropathogenic E coli (EPEC) NOT DETECTED NOT DETECTED Final   Enterotoxigenic E coli (ETEC) NOT DETECTED NOT DETECTED Final   Shiga like toxin producing E coli (STEC) NOT DETECTED NOT DETECTED Final   Shigella/Enteroinvasive E coli (EIEC) NOT DETECTED NOT DETECTED Final   Cryptosporidium NOT DETECTED NOT DETECTED Final   Cyclospora cayetanensis NOT DETECTED NOT DETECTED Final   Entamoeba histolytica NOT DETECTED NOT DETECTED Final   Giardia lamblia NOT DETECTED NOT DETECTED Final   Adenovirus F40/41 NOT DETECTED NOT DETECTED Final   Astrovirus NOT DETECTED NOT DETECTED Final   Norovirus GI/GII NOT DETECTED NOT DETECTED Final   Rotavirus A NOT DETECTED NOT DETECTED Final   Sapovirus (I, II, IV, and V) NOT DETECTED NOT DETECTED Final    Comment: Performed at [facility], [address] [place], [place] [address]  C Difficile Quick Screen w PCR reflex     Status: None   Collection Time: 09/21/21 11:30 AM   Specimen: STOOL  Result Value Ref Range Status   C Diff antigen NEGATIVE NEGATIVE Final   C Diff toxin NEGATIVE NEGATIVE Final   C Diff interpretation No C. difficile detected.  Final    Comment: Performed at [facility], [address] [place], [place] [address]  Culture, blood (Routine X 2) w Reflex to ID Panel     Status: None   Collection Time: 09/21/21 12:28 PM   Specimen: BLOOD  Result Value Ref Range Status   Specimen Description BLOOD BLOOD RIGHT HAND  Final   Special Requests   Final    BOTTLES DRAWN AEROBIC AND ANAEROBIC Blood Culture adequate volume   Culture   Final    NO GROWTH 5 DAYS Performed at [facility], [address], [place], [place] [address]    Report Status 09/26/2021 FINAL  Final  Culture, blood (Routine X 2) w Reflex to ID Panel     Status: None   Collection Time: 09/21/21  2:08 PM   Specimen: BLOOD  Result Value Ref Range Status   Specimen  Description BLOOD BLOOD LEFT HAND  Final   Special Requests   Final    BOTTLES DRAWN AEROBIC AND ANAEROBIC Blood Culture adequate volume   Culture   Final    NO GROWTH 5 DAYS Performed at [facility], [address] [place], [place] [address]    Report Status 09/26/2021 FINAL  Final  Respiratory (~20 pathogens)  panel by PCR     Status: None   Collection Time: 09/21/21  6:14 PM   Specimen: Nasopharyngeal Swab; Respiratory  Result Value Ref Range Status   Adenovirus NOT DETECTED NOT DETECTED Final   Coronavirus 229E NOT DETECTED NOT DETECTED Final    Comment: (NOTE) The Coronavirus on the Respiratory Panel, DOES NOT test for the novel  Coronavirus (2019 nCoV)    Coronavirus HKU1 NOT DETECTED NOT DETECTED Final   Coronavirus NL63 NOT DETECTED NOT DETECTED Final   Coronavirus OC43 NOT DETECTED NOT DETECTED Final   Metapneumovirus NOT DETECTED NOT DETECTED Final   Rhinovirus / Enterovirus NOT DETECTED NOT DETECTED Final   Influenza A NOT DETECTED NOT DETECTED Final   Influenza B NOT DETECTED NOT DETECTED Final   Parainfluenza Virus 1 NOT DETECTED NOT DETECTED Final   Parainfluenza Virus 2 NOT DETECTED NOT DETECTED Final   Parainfluenza Virus 3 NOT DETECTED NOT DETECTED Final   Parainfluenza Virus 4 NOT DETECTED NOT DETECTED Final   Respiratory Syncytial Virus NOT DETECTED NOT DETECTED Final   Bordetella pertussis NOT DETECTED NOT DETECTED Final   Bordetella Parapertussis NOT DETECTED NOT DETECTED Final   Chlamydophila pneumoniae NOT DETECTED NOT DETECTED Final   Mycoplasma pneumoniae NOT DETECTED NOT DETECTED Final    Comment: Performed at [facility], [address] [place], [place] [address]  Culture, Respiratory w Gram Stain     Status: None   Collection Time: 10/06/21 10:59 AM   Specimen: Tracheal Aspirate; Respiratory  Result Value Ref Range Status   Specimen Description   Final    TRACHEAL ASPIRATE Performed at [facility], [address], [place], [place] [address]    Special Requests   Final    NONE Performed at [facility], [address] [place], [place] [address]    Gram Stain   Final    ABUNDANT WBC PRESENT, PREDOMINANTLY PMN ABUNDANT GRAM NEGATIVE RODS Performed at [facility], [place] [address] [place], [place] [address]    Culture ABUNDANT [name]  Final   Report Status 10/09/2021 FINAL  Final   Organism ID, Bacteria [name]  Final      Susceptibility   [name] - MIC*    CEFTAZIDIME 8 SENSITIVE Sensitive     CIPROFLOXACIN <=0.25 SENSITIVE Sensitive     GENTAMICIN 2 SENSITIVE Sensitive     IMIPENEM 1 SENSITIVE Sensitive     * ABUNDANT [name]    Coagulation Studies: No results for input(s): "LABPROT", "INR" in the last 72 hours.  Urinalysis: No results for input(s): "COLORURINE", "LABSPEC", "PHURINE", "GLUCOSEU", "HGBUR", "BILIRUBINUR", "KETONESUR", "PROTEINUR", "UROBILINOGEN", "NITRITE", "LEUKOCYTESUR" in the last 72 hours.  Invalid input(s): "APPERANCEUR"    Imaging: DG Chest Port 1 View  Result Date: 10/20/2021 CLINICAL DATA:  Pulmonary edema EXAM: PORTABLE CHEST 1 VIEW COMPARISON:  10/13/2021 FINDINGS: Tracheostomy tube in place. Cardiomegaly and vascular pedicle widening with vascular congestion. Some atelectasis is likely superimposed. Remote left rib fractures. No visible effusion or pneumothorax. Lung volumes are low. IMPRESSION: Cardiomegaly and vascular congestion similar to prior. Electronically Signed   By: Jorje Guild M.D.   On: 10/20/2021 05:44     Medications:    sodium chloride Stopped (10/07/21 0542)   feeding supplement (NEPRO CARB STEADY) Stopped (10/20/21 0000)   magnesium sulfate bolus IVPB      atorvastatin  10 mg Per Tube Daily   budesonide (PULMICORT) nebulizer solution  0.5 mg Nebulization BID   Chlorhexidine  Gluconate Cloth  6 each Topical Daily   cholecalciferol  2,000 Units Per Tube Daily    epoetin (EPOGEN/PROCRIT) injection  20,000 Units Subcutaneous Weekly   feeding supplement (PROSource TF)  45 mL Per Tube QID   free water  200 mL Per Tube Q4H   heparin injection (subcutaneous)  5,000 Units Subcutaneous Q8H   heparin sodium (porcine)  2,000 Units Intravenous Q M,W,F-HD   ipratropium-albuterol  3 mL Nebulization BID   multivitamin  1 tablet Per Tube QHS   nutrition supplement (JUVEN)  1 packet Per Tube BID BM   mouth rinse  15 mL Mouth Rinse 4 times per day   pantoprazole sodium  40 mg Per Tube BID   potassium & sodium phosphates  1 packet Per Tube TID WC & HS   potassium chloride  40 mEq Per Tube Once   traZODone  50 mg Oral QHS   acetaminophen, acetaminophen, albuterol, alteplase, bisacodyl, clonazepam, heparin, HYDROmorphone (DILAUDID) injection, mouth rinse, oxyCODONE, pentafluoroprop-tetrafluoroeth, polyethylene glycol, QUEtiapine  Assessment/ Plan:  [name] is a 66 y.o.  male with past medical conditions including COPD requiring 2 L nasal cannula, and congestive heart failure.  Patient presents to the emergency department with shortness of breath.  Patient has been admitted for COPD exacerbation ([place]) [J44.1] Obesity hypoventilation syndrome ([place]) [E66.2] Acute on chronic diastolic congestive heart failure ([name]) [I50.33] Acute respiratory failure with hypoxia and hypercapnia ([name]) [J96.01, J96.02] Acute on chronic respiratory failure with hypoxia and hypercapnia ([name]) [I33.82, J96.22]    Acute Kidney Injury with hyperkalemia, cause unidentified.  No IV contrast exposure and brief hypotensive event. Normal renal function 2 days prior.Renal Ultrasound shows mild right obstruction , simple cyst in  left kidney and bladder distendion.   -Renal recovery remains evident based on labs and recorded urine output.  BUN remains elevated however stable.  No further dialysis required at this time.  Lab Results  Component Value Date   CREATININE 1.38 (H) 10/20/2021    CREATININE 1.40 (H) 10/20/2021   CREATININE 1.59 (H) 10/19/2021    Intake/Output Summary (Last 24 hours) at 10/20/2021 1041 Last data filed at 10/20/2021 0230 Gross per 24 hour  Intake 635.97 ml  Output 1100 ml  Net -464.03 ml    Acute respiratory failure, Pseudomonas noted in sputum on September 19, 2021. IV antibiotics completed Monitoring of respiratory status as per pulmonary/critical care. -Did not require ventilator support overnight, remains on trach collar at 35% FiO2  Chronic diastolic heart failure.  Echo from 08/07/2021 shows EF 60 to 65% with a mildly dilated left ventricular cavity and grade 1 diastolic dysfunction. -Adequate urine output  Anemia of critical illness, Hemoglobin 9.1, continue weekly subcu EPO 20,000 units.    LOS: [place] 7/7/202310:41 AM

## 2021-10-20 NOTE — Plan of Care (Signed)
Continuing with plan of care. 

## 2021-10-20 NOTE — Progress Notes (Signed)
Patient pulled trach out.  Patient attempting to swing at staff when replacing trach.  Able to redirect patient as to why trach has to be put back in. Patient calmed and allowed suctioning, replacement of ties, guaze and inner cannula. Back on 35% atc.  Moderate amount of pink tinged secretions.

## 2021-10-20 NOTE — Progress Notes (Addendum)
Notified by nursing staff that patient had asked to be placed on the vent.  He has remained on TC since 7/2 @ 08:00 and tolerated well. Patient remains agitated requiring PRN medications. He  is otherwise hemodynamically stable with no respiratory issues that requires vent support.  Adult Mechanical vent orders have been discontinued  at the moment. Discussed with attending Dr. Mortimer Fries, who advised that from PCCM stand point, patient does not require overnight vent support and has been weaned to [facility] and tolerating since 7/2. Per Dr. Mortimer Fries, if there is concerns  for probable hypercapnia or need for vent support, ABG should be obtained prior to placing pt back on the vent and PCCM re-consulted for vent orders and management. Patient currently refusing ABGs,remains on TC but difficult to redirect.   [name], DNP, CCRN, FNP-C, AGACNP-BC Acute Care & Family Nurse Practitioner  [facility]  See Amion for personal pager PCCM on call pager [phone] until 7 am

## 2021-10-20 NOTE — Progress Notes (Signed)
Trach suctioned for lg amt of thick tan secretions 

## 2021-10-20 NOTE — TOC Progression Note (Signed)
Transition of Care [facility]) - Progression Note    Patient Details  Name: JULY NICKSON MRN: [id number] Date of Birth: 1956-01-19  Transition of Care [facility]) CM/SW [place], [place] Phone Number: 10/20/2021, 10:45 AM  Clinical Narrative:     CSW received vm from Ed with [facility] insurance at [phone] wanting more information about the insurance auth exception being asked for [facility] since they are not in network, attempted to call Ed back lvm. Pending call back to explain limited vent and trach snfs in area, [facility] in [place] agreeable to review referral If patient can get auth through insurance.      Expected Discharge Plan: Long Term Acute Care (LTAC) Barriers to Discharge: Continued Medical Work up  Expected Discharge Plan and Services Expected Discharge Plan: Long Term Acute Care (LTAC)   Discharge Planning Services: CM Consult Post Acute Care Choice: Long Term Acute Care (LTAC) Living arrangements for the past 2 months: Single Family Home                 DME Arranged: N/A DME Agency: NA       HH Arranged: NA HH Agency: NA         Social Determinants of Health (SDOH) Interventions    Readmission Risk Interventions    05/04/2021    1:26 PM 04/07/2021    1:39 PM 03/12/2021   11:54 AM  Readmission Risk Prevention Plan  Transportation Screening Complete Complete Complete  PCP or Specialist Appt within 3-5 Days  Complete Complete  HRI or [place]   Complete  Social Work Consult for [place] Planning/Counseling   Complete  Palliative Care Screening  Not Applicable Not Applicable  Medication Review Press photographer) Complete Complete Complete  PCP or Specialist appointment within 3-5 days of discharge Complete    HRI or [place] Complete    SW Recovery Care/Counseling Consult Complete    [place] Not Applicable

## 2021-10-20 NOTE — Plan of Care (Signed)
Patient is PCU level of care but remains in CCU/ICU. Patient is alert and oriented and verbally abusive to staff (see my progress note). Patient remains on adult trach collar. Passy-Muir valve is in place and patient is tolerating without difficulty. Patient is able to feed self with supervision. Patient is able to verbally express needs. Patient is dependent for ambulation. At the time of writing, patient has refused all nursing interventions so far this shift.   Problem: Education: Goal: Ability to demonstrate management of disease process will improve Outcome: Not Progressing Goal: Ability to verbalize understanding of medication therapies will improve Outcome: Not Progressing   Problem: Activity: Goal: Capacity to carry out activities will improve Outcome: Not Progressing   Problem: Cardiac: Goal: Ability to achieve and maintain adequate cardiopulmonary perfusion will improve Outcome: Not Progressing   Problem: Education: Goal: Knowledge of disease or condition will improve Outcome: Not Progressing Goal: Knowledge of the prescribed therapeutic regimen will improve Outcome: Not Progressing   Problem: Activity: Goal: Ability to tolerate increased activity will improve Outcome: Not Progressing Goal: Will verbalize the importance of balancing activity with adequate rest periods Outcome: Not Progressing   Problem: Respiratory: Goal: Ability to maintain a clear airway will improve Outcome: Not Progressing Goal: Levels of oxygenation will improve Outcome: Not Progressing Goal: Ability to maintain adequate ventilation will improve Outcome: Not Progressing   Problem: Education: Goal: Knowledge of General Education information will improve Description: Including pain rating scale, medication(s)/side effects and non-pharmacologic comfort measures Outcome: Not Progressing   Problem: Health Behavior/Discharge Planning: Goal: Ability to manage health-related needs will  improve Outcome: Not Progressing   Problem: Clinical Measurements: Goal: Ability to maintain clinical measurements within normal limits will improve Outcome: Not Progressing Goal: Will remain free from infection Outcome: Not Progressing Goal: Diagnostic test results will improve Outcome: Not Progressing Goal: Respiratory complications will improve Outcome: Not Progressing Goal: Cardiovascular complication will be avoided Outcome: Not Progressing   Problem: Activity: Goal: Risk for activity intolerance will decrease Outcome: Not Progressing   Problem: Nutrition: Goal: Adequate nutrition will be maintained Outcome: Not Progressing   Problem: Coping: Goal: Level of anxiety will decrease Outcome: Not Progressing   Problem: Elimination: Goal: Will not experience complications related to bowel motility Outcome: Not Progressing Goal: Will not experience complications related to urinary retention Outcome: Not Progressing   Problem: Pain Managment: Goal: General experience of comfort will improve Outcome: Not Progressing   Problem: Safety: Goal: Ability to remain free from injury will improve Outcome: Not Progressing   Problem: Skin Integrity: Goal: Risk for impaired skin integrity will decrease Outcome: Not Progressing   Problem: Education: Goal: Ability to describe self-care measures that may prevent or decrease complications (Diabetes Survival Skills Education) will improve Outcome: Not Progressing   Problem: Coping: Goal: Ability to adjust to condition or change in health will improve Outcome: Not Progressing   Problem: Fluid Volume: Goal: Ability to maintain a balanced intake and output will improve Outcome: Not Progressing   Problem: Health Behavior/Discharge Planning: Goal: Ability to identify and utilize available resources and services will improve Outcome: Not Progressing Goal: Ability to manage health-related needs will improve Outcome: Not Progressing    Problem: Metabolic: Goal: Ability to maintain appropriate glucose levels will improve Outcome: Not Progressing   Problem: Nutritional: Goal: Maintenance of adequate nutrition will improve Outcome: Not Progressing Goal: Progress toward achieving an optimal weight will improve Outcome: Not Progressing   Problem: Skin Integrity: Goal: Risk for impaired skin integrity will decrease Outcome: Not Progressing  Problem: Tissue Perfusion: Goal: Adequacy of tissue perfusion will improve Outcome: Not Progressing

## 2021-10-20 NOTE — Progress Notes (Signed)
Patient pulled out trach. Extra supplies at the bedside was used to replace. Patient did not tolerate insertion well at beginning by swinging at staff. Patient redirected but still agitated. Ativan '2mg'$  ordered and given. RT x 2, RN x 2 and Provider at bedside. O2 sats remained above 90%.Passy Muir valve not on the prior  trach and unable to be found at this time. Pink tinged secretions noted requiring suctioning. Patient now calm and resting.

## 2021-10-20 NOTE — Progress Notes (Signed)
[place] for Electrolyte Monitoring and Replacement   Recent Labs: Potassium (mmol/L)  Date Value  10/20/2021 3.2 (L)  10/20/2021 3.3 (L)  04/15/2014 3.9   Magnesium (mg/dL)  Date Value  10/20/2021 1.8  08/21/2012 1.9   Calcium (mg/dL)  Date Value  10/20/2021 8.6 (L)  10/20/2021 8.6 (L)   Calcium, Total (mg/dL)  Date Value  04/15/2014 8.3 (L)   Albumin (g/dL)  Date Value  10/20/2021 2.5 (L)  04/15/2014 2.9 (L)   Phosphorus (mg/dL)  Date Value  10/20/2021 2.0 (L)   Sodium (mmol/L)  Date Value  10/20/2021 146 (H)  10/20/2021 148 (H)  03/15/2020 145 (H)  04/15/2014 140    Assessment: 66 y/o male with h/o PUD, OSA, HTN, CKD III, substance abuse, CHF, Hep C and COPD and COVID 19 (02/2021) who is admitted with COPD exacerbation, Pseudomonas pneumonia on ceftazidime, HFpEF, and AKI. Intubated and on fentanyl. Pharmacy is asked to follow and replace electrolytes while in CCU.  G-tube placed by IR 6/30  Nutrition: Tube feeds at 70 cc/hr + Prosource TID + Juven BIDM  Patient is experiencing renal recovery. Nephrology following, holding further dialysis at this point.  Goal of Therapy:  Electrolytes within normal limits  Plan:  --Hypernatremia stable / resolving; continue free water flushes 200 mL per tube q4h --K 3.3 (from 3.8 yesterday); Kcl 40 mEq per tube x 1 dose & 28 mEq K+ from Phos-Nak supplementation as below --Phos 2 (from 2.4 yesterday); Phos-Nak 1 packet TIDACHS x 4 doses. If patient continues to be refractory to enteral repletion, will consider IV replacement tomorrow --Mg 1.8, magnesium sulfate 2 g IV x 1 --Follow-up electrolytes with AM labs tomorrow  Benita Gutter 10/20/2021 7:54 AM

## 2021-10-20 NOTE — Progress Notes (Signed)
Physical Therapy Treatment Patient Details Name: Gary [name] MRN: [id number] DOB: [birth year] Today's Date: 10/20/2021   History of Present Illness Patient was admitted secondary to SOB, COPD exacerbation, acute metabolic encephalopathy, and acute on chronic hypoxic and hypercapnic respiratory failure in setting of acute decompensated HFpEF and AECOPD requiring intubation and mechanical ventilation. The pt is now s/p tracheostomy and PEG tube placement 11/05/21.    PT Comments    Pt was somewhat lethargic but able to participate during the session and put forth good effort throughout. Pt completed bed mobility w/ +3 maxA for trunk control and leg management and with physical/verbal cues throughout. Multiple attempts made to stand with one successful sit to stand w/ +3 maxA to initiate stand despite elevated surface. Heavy physical/verbal cues needed for sequencing and  required maxA steadying once in stand. Maintain position for 5-10 sec max with very forward flexed posture and unable to come full upright with min clearance. Pt will benefit from PT services in a SNF setting upon discharge to safely address deficits listed in patient problem list for decreased caregiver assistance and eventual return to PLOF.    Recommendations for follow up therapy are one component of a multi-disciplinary discharge planning process, led by the attending physician.  Recommendations may be updated based on patient status, additional functional criteria and insurance authorization.  Follow Up Recommendations  Skilled nursing-short term rehab (<3 hours/day) Can patient physically be transported by private vehicle: No   Assistance Recommended at Discharge Frequent or constant Supervision/Assistance  Patient can return home with the following Two people to help with walking and/or transfers;Two people to help with bathing/dressing/bathroom;Assistance with cooking/housework;Assist for transportation;Help with stairs or  ramp for entrance   Equipment Recommendations  Other (comment)    Recommendations for Other Services       Precautions / Restrictions Precautions Precautions: Fall Precaution Comments: IJ, G tube, trach Restrictions Weight Bearing Restrictions: No     Mobility  Bed Mobility   Bed Mobility: Rolling, Supine to Sit, Sit to Supine Rolling: Max assist (+3 physical assistance)   Supine to sit: Max assist (+3 physical assistance) Sit to supine: Max assist (+3 physical assistance)   General bed mobility comments: maxA +3 for for trunk control and leg management; verbal/physical cues needed for sequencing    Transfers Overall transfer level: Needs assistance Equipment used: Rolling walker (2 wheels) Transfers: Sit to/from Stand Sit to Stand: Max assist (+3 physical assistance)           General transfer comment: +3 maxA to initiate stand despite high elevated surface and w/ knee block. heavy cuing needed for sequencing and hand placement. very foward flexed posture in standing and unable to come fully upright with minimal clearance.    Ambulation/Gait               General Gait Details: unable to perform at this time   Stairs             Wheelchair Mobility    Modified Rankin (Stroke Patients Only)       Balance Overall balance assessment: Needs assistance Sitting-balance support: Bilateral upper extremity supported, Feet supported Sitting balance-[name] Scale: Fair     Standing balance support: Bilateral upper extremity supported, Reliant on assistive device for balance, During functional activity Standing balance-[name] Scale: Poor                              Cognition Arousal/Alertness: Awake/alert  Behavior During Therapy: WFL for tasks assessed/performed Overall Cognitive Status: No family/caregiver present to determine baseline cognitive functioning                                          Exercises General  Exercises - Lower Extremity Ankle Circles/Pumps: Strengthening, Both, 10 reps Heel Slides: AAROM, Strengthening, Both, 10 reps    General Comments        Pertinent Vitals/Pain Pain Assessment Pain Assessment: No/denies pain    Home Living                          Prior Function            PT Goals (current goals can now be found in the care plan section) Progress towards PT goals: Progressing toward goals    Frequency    Min 2X/week      PT Plan Current plan remains appropriate    Co-evaluation              AM-PAC PT "6 Clicks" Mobility   Outcome Measure  Help needed turning from your back to your side while in a flat bed without using bedrails?: A Lot Help needed moving from lying on your back to sitting on the side of a flat bed without using bedrails?: A Lot Help needed moving to and from a bed to a chair (including a wheelchair)?: A Lot Help needed standing up from a chair using your arms (e.g., wheelchair or bedside chair)?: A Lot Help needed to walk in hospital room?: Total Help needed climbing 3-5 steps with a railing? : Total 6 Click Score: 10    End of Session Equipment Utilized During Treatment: Gait belt Activity Tolerance: Patient tolerated treatment well Patient left: in bed;with call bell/phone within reach;with nursing/sitter in room Nurse Communication: Mobility status PT Visit Diagnosis: Unsteadiness on feet (R26.81);Muscle weakness (generalized) (M62.81);Difficulty in walking, not elsewhere classified (R26.2)     Time: 3536-1443 PT Time Calculation (min) (ACUTE ONLY): 29 min  Charges:                        Turner Daniels, SPT  10/20/2021, 10:38 AM

## 2021-10-21 DIAGNOSIS — J9622 Acute and chronic respiratory failure with hypercapnia: Secondary | ICD-10-CM | POA: Diagnosis not present

## 2021-10-21 DIAGNOSIS — J9621 Acute and chronic respiratory failure with hypoxia: Secondary | ICD-10-CM | POA: Diagnosis not present

## 2021-10-21 LAB — GASTROINTESTINAL PANEL BY PCR, STOOL (REPLACES STOOL CULTURE)

## 2021-10-21 LAB — RENAL FUNCTION PANEL
Albumin: 2.4 g/dL — ABNORMAL LOW (ref 3.5–5.0)
Anion gap: 5 (ref 5–15)
BUN: 53 mg/dL — ABNORMAL HIGH (ref 8–23)
CO2: 30 mmol/L (ref 22–32)
Calcium: 8.6 mg/dL — ABNORMAL LOW (ref 8.9–10.3)
Chloride: 111 mmol/L (ref 98–111)
Creatinine, Ser: 1.4 mg/dL — ABNORMAL HIGH (ref 0.61–1.24)
GFR, Estimated: 56 mL/min — ABNORMAL LOW (ref >60)
Glucose, Bld: 112 mg/dL — ABNORMAL HIGH (ref 70–99)
Phosphorus: 4.7 mg/dL — ABNORMAL HIGH (ref 2.5–4.6)
Potassium: 3.8 mmol/L (ref 3.5–5.1)
Sodium: 146 mmol/L — ABNORMAL HIGH (ref 135–145)

## 2021-10-21 LAB — CBC WITH DIFFERENTIAL/PLATELET
Abs Immature Granulocytes: 0.07 10*3/uL (ref 0.00–0.07)
Basophils Absolute: 0 10*3/uL (ref 0.0–0.1)
Basophils Relative: 1 %
Eosinophils Absolute: 0.1 10*3/uL (ref 0.0–0.5)
Eosinophils Relative: 2 %
HCT: 32 % — ABNORMAL LOW (ref 39.0–52.0)
Hemoglobin: 8.6 g/dL — ABNORMAL LOW (ref 13.0–17.0)
Immature Granulocytes: 1 %
Lymphocytes Relative: 20 %
Lymphs Abs: 1.3 10*3/uL (ref 0.7–4.0)
MCH: 22.7 pg — ABNORMAL LOW (ref 26.0–34.0)
MCHC: 26.9 g/dL — ABNORMAL LOW (ref 30.0–36.0)
MCV: 84.4 fL (ref 80.0–100.0)
Monocytes Absolute: 1 10*3/uL (ref 0.1–1.0)
Monocytes Relative: 15 %
Neutro Abs: 4 10*3/uL (ref 1.7–7.7)
Neutrophils Relative %: 61 %
Platelets: 224 10*3/uL (ref 150–400)
RBC: 3.79 MIL/uL — ABNORMAL LOW (ref 4.22–5.81)
RDW: 18.1 % — ABNORMAL HIGH (ref 11.5–15.5)
WBC: 6.6 10*3/uL (ref 4.0–10.5)
nRBC: 0.5 % — ABNORMAL HIGH (ref 0.0–0.2)

## 2021-10-21 LAB — MAGNESIUM: Magnesium: 2.1 mg/dL (ref 1.7–2.4)

## 2021-10-21 LAB — GLUCOSE, CAPILLARY
Glucose-Capillary: 116 mg/dL — ABNORMAL HIGH (ref 70–99)
Glucose-Capillary: 111 mg/dL — ABNORMAL HIGH (ref 70–99)
Glucose-Capillary: 142 mg/dL — ABNORMAL HIGH (ref 70–99)
Glucose-Capillary: 109 mg/dL — ABNORMAL HIGH (ref 70–99)
Glucose-Capillary: 113 mg/dL — ABNORMAL HIGH (ref 70–99)

## 2021-10-21 MED ORDER — POTASSIUM CHLORIDE 20 MEQ PO PACK
40.0000 meq | PACK | Freq: Once | ORAL | Status: AC
  Administered 2021-10-21: 40 meq
  Filled 2021-10-21: qty 2

## 2021-10-21 NOTE — Progress Notes (Addendum)
1700:  Pt transferred in from ICU.  Originally to 247, room changed to 239 due to patient needs - trach, feeding tube, tele, bari bed, etc  Pt denies any pain.  Oriented to room and unit.  Placed oxygen, telemetry.  Verified Kangaroo pump settings.  Respiratory at bedside - deep suctioned patient and assisted RN with setup.  Obdurator at bedside.  Fed pt.  Pt resting comfortable with call button and phone within reach.

## 2021-10-21 NOTE — Progress Notes (Signed)
PROGRESS NOTE    Gary [name]  WNI:[id number] DOB: Jul 04, 1955  DOA: 09/19/2021 Date of Service: 10/21/21 PCP: [facility] Course:  66 y/o male with multiple medical problems causing chronic hypercapneic respiratory failure who has had admissions in the past for exacerbations of the same and mechanical ventilation presented for evaluation of dyspnea, weakness. It is unclear if the patient has been compliant with his medications and NIMV at home.  He was admitted by the hospitalists for a presumed COPD exacerbation in the ER, treated with bronchodilators and NIMV but his mental status and hypercarbia worsened despite those interventions so he required intubation.   6/6: Presented to ED.  Required intubation and mechanical ventilation in the ED.  PCCM admit ICU 6/7: Hold diuresis due to worsening Creatinine.  ENT consulted for Trach placement per family request 6/8: Pt continues to spike temps tmax 101.8 degrees F.  Flexiseal placed overnight draining foul smelling watery stool~Cdiff and GI panel pending  6/9: Fever resolved.  Negative for C- diff, GI panel still pending.  Creatinine slightly worsened, requiring low dose levophed (2 mcg). Holding diuresis 6/10: propofol discontinued due hypertriglyceridemia; failed SBT 6/11: Tracheal aspirate from 6/6 resulting with Pseudomonas, ABX changed to Cefepime. neurologically intact, awaiting Trach placement 6/12: Pt calmer today following addition of oral benzo's, narcotics, and Seroquel via tube.  Creatinine worsened with decreased UOP, holding diuresis, low threshold for Nephrology consult  6/13: Pt with worsening renal failure creatinine 4.15.  UOP overnight 25 ml.  Nephrology consulted plans to start pt on hemodialysis follow dialysis catheter placement Pt underwent hemodialysis without ultrafiltration 06/14: .  Plans for HD session today.  Agitation has improved significantly with narcotics and antianxiety medications  per tube   06/16: Tracheostomy canceled 06/15 due to hyperkalemia and tentatively rescheduled for 06/20  6/18 failure to wean from vent 06/20: ENT placed Tracheostomy (#7.0 XLT cuffed).  Developed mild epistaxis post unsuccessful attempt at NGT placement.  Later developed hematochezia/melena, Protonix gtt started and GI consulted. 06/21: Diuresed yesterday with limited results. Patient had softed blood pressures throughout the day and diuresis was held as a result. HD was performed by the patient clotted off the machine and was not able to be rinsed back. Repeat hemoglobin is above 8. 06/23: Pt remains mechanically intubated settings: FiO2 50%/PEEP 10.  CXR concerning for moderate right pleural effusion with associated right basilar atelectasis vs. infiltrate.  Will perform [place] Chest to determine if pt needs right sided thoracentesis  06/24: Remains on the ventilator, FiO2 requirements decreasing, respiratory culture with abundant gram-negative rods: [name], on Meropenem 06/25: Oxygen requirements down to 40%, chest x-ray shows improvement on dense right consolidation, Pseudomonas susceptibilities pending 06/25: Pt remains on the ventilator settings: PEEP 10 and FiO2 40%; will attempt to wean PEEP to 5 to perform SBT  06/28: Pt tolerated PS on 06/27.  Overnight pt had episodes of vomiting TF's suspect secondary to malposition of nasogastric tube.  Will consult IR for PEG tube placement if unable to place PEG tube pt will need postpyloric Dobbhoff   06/29: Pt tolerated PS 5/5 for over an hour yesterday, however due to fatigue placed back on PRVC.  Pt pulled NGT out several times overnight, therefore will not attempt to reinsert.  Plans for PEG tube placement per IR on 06/30.   06/30: PEG tube placed per IR  07/1: Pt tolerated trach collar '@40'$ % throughout the day 07/2: Pt placed on ventilator overnight settings 16/5/35% tolerating well.  Will place pt on trach collar today and place orders  for PT/OT 7/3: Hemodynamically stable.  Tolerating TC (has remained on TC since 7/2 @ 08:00), plan for speech therapy and PT/OT 7/4: Has maintained on trach collar for greater than 48 hours, tolerating Passy-Muir valve.  Plan to transfer out of ICU. SLP recs: dysphagia 1 (puree) w/ nectar liquid.  7/5-7/6: sodium and creatinine trending appropriately, Hgb stable. Low K repleted and stable. Was off vent qhs (orders d/c or missed?) and was restarted. Will need LTAC, TOC following  7/7: pulled out trach overnight. Confusion re: vent plan among staff and myself. I asked PCCM to reevluate him and verify plan so the team and the patient can eb on the same page, and will help TOC arrange appropriate placement.  7/8: I spoke personally w/ Dr Milon Dikes [facility]) and he confirms patient does NOT need ventilator support, unless of course develops distress, but last night the patient tolerated well without it. Plan to meet w/ family today when they visit, I spoke w/ his sister yesterday on the phone. No concerns on CBC/BMP: Cr tending down a bit, Hgb stable, Na slightly high but improving over past few days. He is approaching medically stable and may be appropriate for LTAC vs SNF soon   Consultants:  PCCU Nephrology ENT GI IR  Procedures: 10/03/21: tracheostomy placement 10/12/21: dialysis permacath placement  10/13/21: G-tube placed    Subjective: Patient says he just wants to get out of here, asking about why nothing is being done.       ASSESSMENT & PLAN:   Principal Problem:   Acute on chronic respiratory failure with hypoxia and hypercapnia ([name]) Active Problems:   Acute on chronic diastolic congestive heart failure ([name])   COPD with acute exacerbation ([name])   Acute metabolic encephalopathy   HTN (hypertension)   Obesity, Class III, BMI 40-49.9 (morbid obesity) ([place])   Obstructive sleep apnea   Chronic kidney disease, stage 3a ([name])   HLD (hyperlipidemia)   Pressure injury of skin   No  problem-specific Assessment & Plan notes found for this encounter.  Acute on Chronic Hypoxic & Hypercapnic Respiratory Failure in the setting of acute decompensated HFpEF, AECOPD, Pseudomonas Pneumonia, & OSA/OHS Moderate right sided pleural effusion -ruled out, actually lung CONSOLIDATION -S/p  tracheostomy by ENT by 6/20 -Trach collar w/ ventilator support as needed / qhs but he does NOT need vent supoort overnight routinely per my conversation w/ Dr Milon Dikes 07/08 -VAP bundle implemented  -Bronchodilators & Pulmicort nebs -Completed 10 day course of ceftazidime for treatment  of Pseudomonas -At this point, he is relatively medically stable but placement is going to be an issue for him.  Unable to move to general medical floor given trach and concern might need vent again.    Acute decompensated HFpEF PMHx: Hypertension  Echocardiogram 08/07/2021: LVEF 60 to 95%, grade 1 diastolic dysfunction, normal RV systolic function -Continuous cardiac monitoring -Maintain MAP >65 -Was on intermittent hemodialysis for volume removal - has not needed HD and has remained compensated    AKI superimposed CKD stage IIIa  Hypokalemia  Hypernatremia During admissions in February 2023, creatinine 1.8 with GFR 40; in May 2023 creatinine 1.1 with GFR of 60 -Monitor I&O's / urinary output -Follow BMP  -Cr down, good UOP noted, d/c dialysis catheter at this time, monitor renal parameters daily.  -Increased free water flushes 7/4 -No further dialysis needed per nephro, see note 10/18/21    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~RESOLVED Pseudomonas Pneumonia ~  persistent, initial ABX course short - on meropenem ~ TREATED -Monitor fever curve -Trend WBC's -Completed 10 day course of ceftazidime (started on 06/26) -C. Diff negative   Acute Blood Loss Anemia~RESOLVED Nasopharyngeal trauma vs Upper GI bleed -Trend CBC  -VTE px: subq heparin and SCD's  -Monitor for s/sx of bleeding and transfuse for  Hgb <7 -Continue BID IV PPI -Consulted GI, no need for scope  -nephrology recommends consider epogen as outpatient    Hyperglycemia ~STABLE -CBG's q4h; Target range of 140 to 180 -SSI -Follow ICU Hypo/Hyperglycemia protocol  Acute Metabolic Encephalopathy in setting of CO2 Narcosis Sedation needs in setting of mechanical ventilation~RESOLVED  CT Head 09/19/21: negative;  UDS negative -Treatment of hypercapnia as outlined above -Maintain a RASS goal of 0  -Continue oxycodone and klonopin prn due to agitation/delirium but limit these if possible  Trach in place Gtube in place Was Requiring ventilatory support at night, has done well without this Working on increasing po intake, SLP following w/ some concern for mild aspiration CXR in AM 07/07 no signs aspiration   DVT prophylaxis: SCD and subcutaneous heparin Code Status: FULL Family Communication:  plan for family meeting today  Disposition Plan / TOC needs: out of ICU and onto progressive status 10/18/21, PT/OT following, may be able to transfer to medsurg but need to know if they'll take patietn w/ trach/vent  Barriers to discharge / significant pending items: remains on tube feeds, diet advancing, remains on trach - may need LTAC,             Objective: Vitals:   10/21/21 0600 10/21/21 0729 10/21/21 0800 10/21/21 0808  BP: 102/68  111/71   Pulse: 82 82 83   Resp: (!) 26 (!) 25 (!) 28 (!) 24  Temp:   98.8 F (37.1 C)   TempSrc:   Axillary   SpO2: 98% 97% 98%   Weight:      Height:        Intake/Output Summary (Last 24 hours) at 10/21/2021 0901 Last data filed at 10/21/2021 0600 Gross per 24 hour  Intake 3845 ml  Output 1000 ml  Net 2845 ml    Filed Weights   10/14/21 0800 10/18/21 0330 10/21/21 0400  Weight: (!) 180 kg (!) 169 kg (!) 193 kg    Examination:  Constitutional:  VS as above General Appearance: alert, well-developed, well-nourished, NAD Eyes: Normal lids and conjunctive, non-icteric  sclera Ears, Nose, Mouth, Throat: Normal appearance Neck: No masses, trachea midline Trach in place, appears clean and ventilating well  No thyroid enlargement/tenderness/mass appreciated Respiratory: Normal respiratory effort Breath sounds normal, no wheeze/rhonchi/rales Cardiovascular: S1/S2 normal, no murmur/rub/gallop auscultated Trace lower extremity edema Gastrointestinal: Nontender, no masses but habitus limits exam  Musculoskeletal:  No clubbing/cyanosis of digits Neurological: No cranial nerve deficit on limited exa Psychiatric: Normal judgment/insight mood and affect today more talkative, he is frustrated, he is motivated to work w/ PT/OT        Scheduled Medications:   atorvastatin  10 mg Per Tube Daily   budesonide (PULMICORT) nebulizer solution  0.5 mg Nebulization BID   Chlorhexidine Gluconate Cloth  6 each Topical Daily   cholecalciferol  2,000 Units Per Tube Daily   epoetin (EPOGEN/PROCRIT) injection  20,000 Units Subcutaneous Weekly   feeding supplement (PROSource TF)  45 mL Per Tube QID   free water  200 mL Per Tube Q4H   heparin injection (subcutaneous)  5,000 Units Subcutaneous Q8H   heparin sodium (porcine)  2,000 Units Intravenous  Q M,W,F-HD   ipratropium-albuterol  3 mL Nebulization BID   multivitamin  1 tablet Per Tube QHS   nutrition supplement (JUVEN)  1 packet Per Tube BID BM   mouth rinse  15 mL Mouth Rinse 4 times per day   pantoprazole sodium  40 mg Per Tube BID   traZODone  50 mg Oral QHS    Continuous Infusions:  sodium chloride Stopped (10/07/21 0542)   feeding supplement (NEPRO CARB STEADY) Stopped (10/20/21 0000)    PRN Medications:  acetaminophen, acetaminophen, albuterol, alteplase, bisacodyl, clonazepam, heparin, HYDROmorphone (DILAUDID) injection, loperamide, mouth rinse, oxyCODONE, pentafluoroprop-tetrafluoroeth, polyethylene glycol, QUEtiapine  Antimicrobials:  Anti-infectives (From admission, onward)    Start      Dose/Rate Route Frequency Ordered Stop   10/12/21 1200  cefTAZidime (FORTAZ) 2 g in sodium chloride 0.9 % 100 mL IVPB        2 g 200 mL/hr over 30 Minutes Intravenous 2 times daily 10/12/21 1122 10/17/21 0844   10/12/21 0417  ceFAZolin (ANCEF) IVPB 1 g/50 mL premix  Status:  Discontinued        1 g 100 mL/hr over 30 Minutes Intravenous 30 min pre-op 10/12/21 0417 10/16/21 0751   10/09/21 2000  ceFEPIme (MAXIPIME) 1 g in sodium chloride 0.9 % 100 mL IVPB  Status:  Discontinued        1 g 200 mL/hr over 30 Minutes Intravenous Every 24 hours 10/09/21 1404 10/09/21 1515   10/09/21 2000  cefTAZidime (FORTAZ) 1 g in sodium chloride 0.9 % 100 mL IVPB  Status:  Discontinued        1 g 200 mL/hr over 30 Minutes Intravenous Every 24 hours 10/09/21 1515 10/12/21 1122   10/06/21 1830  meropenem (MERREM) 500 mg in sodium chloride 0.9 % 100 mL IVPB  Status:  Discontinued        500 mg 200 mL/hr over 30 Minutes Intravenous Every 24 hours 10/06/21 1735 10/09/21 1403   10/06/21 1800  doxycycline (VIBRAMYCIN) 100 mg in sodium chloride 0.9 % 250 mL IVPB  Status:  Discontinued        100 mg 125 mL/hr over 120 Minutes Intravenous Every 12 hours 10/06/21 1657 10/08/21 0838   09/29/21 1000  ceFEPIme (MAXIPIME) 2 g in sodium chloride 0.9 % 100 mL IVPB        2 g 200 mL/hr over 30 Minutes Intravenous Every 12 hours 09/29/21 0812 09/30/21 2342   09/26/21 2200  ceFEPIme (MAXIPIME) 1 g in sodium chloride 0.9 % 100 mL IVPB  Status:  Discontinued        1 g 200 mL/hr over 30 Minutes Intravenous Every 24 hours 09/26/21 1154 09/29/21 0812   09/26/21 2000  ceFEPIme (MAXIPIME) 2 g in sodium chloride 0.9 % 100 mL IVPB  Status:  Discontinued        2 g 200 mL/hr over 30 Minutes Intravenous Every 12 hours 09/26/21 0809 09/26/21 1154   09/24/21 1200  ceFEPIme (MAXIPIME) 2 g in sodium chloride 0.9 % 100 mL IVPB  Status:  Discontinued        2 g 200 mL/hr over 30 Minutes Intravenous Every 8 hours 09/24/21 0950 09/26/21 0809    09/21/21 1430  azithromycin (ZITHROMAX) 500 mg in sodium chloride 0.9 % 250 mL IVPB        500 mg 250 mL/hr over 60 Minutes Intravenous Every 24 hours 09/21/21 1344 09/23/21 1543   09/21/21 1430  cefTRIAXone (ROCEPHIN) 2 g in sodium chloride 0.9 % 100  mL IVPB  Status:  Discontinued        2 g 200 mL/hr over 30 Minutes Intravenous Every 24 hours 09/21/21 1344 09/24/21 5462       Data Reviewed: I have personally reviewed following labs and imaging studies  CBC: Recent Labs  Lab 10/17/21 0418 10/18/21 0456 10/19/21 0328 10/20/21 0426 10/21/21 0736  WBC 6.5 6.3 6.2 6.3 6.6  NEUTROABS 3.8 3.1 3.1 4.5 4.0  HGB 8.6* 8.8* 8.5* 9.1* 8.6*  HCT 31.2* 31.6* 30.9* 33.2* 32.0*  MCV 81.7 81.9 82.2 82.6 84.4  PLT 213 262 269 232 703    Basic Metabolic Panel: Recent Labs  Lab 10/17/21 0418 10/18/21 0456 10/19/21 0328 10/20/21 0426 10/21/21 0736  NA 151* 149* 147* 146*  148* 146*  K 3.4* 3.0* 3.8 3.2*  3.3* 3.8  CL 120* 116* 113* 113*  115* 111  CO2 '28 29 29 28  30 30  '$ GLUCOSE 113* 100* 95 92  93 112*  BUN 82* 69* 69* 65*  67* 53*  CREATININE 1.93* 1.69* 1.59* 1.38*  1.40* 1.40*  CALCIUM 8.5* 8.5* 8.3* 8.6*  8.6* 8.6*  MG 2.0 1.9 1.7 1.8 2.1  PHOS 2.4* 2.0* 2.4* 2.0* 4.7*    GFR: Estimated Creatinine Clearance: 96.4 mL/min (A) (by C-G formula based on SCr of 1.4 mg/dL (H)). Liver Function Tests: Recent Labs  Lab 10/17/21 0418 10/18/21 0456 10/19/21 0328 10/20/21 0426 10/21/21 0736  ALBUMIN 2.3* 2.3* 2.4* 2.5* 2.4*    No results for input(s): "LIPASE", "AMYLASE" in the last 168 hours. No results for input(s): "AMMONIA" in the last 168 hours. Coagulation Profile: No results for input(s): "INR", "PROTIME" in the last 168 hours. Cardiac Enzymes: No results for input(s): "CKTOTAL", "CKMB", "CKMBINDEX", "TROPONINI" in the last 168 hours. BNP (last 3 results) No results for input(s): "PROBNP" in the last 8760 hours. HbA1C: No results for input(s): "HGBA1C" in  the last 72 hours. CBG: Recent Labs  Lab 10/20/21 1534 10/20/21 2002 10/20/21 2355 10/21/21 0401 10/21/21 0734  GLUCAP 87 112* 112* 116* 111*    Lipid Profile: No results for input(s): "CHOL", "HDL", "LDLCALC", "TRIG", "CHOLHDL", "LDLDIRECT" in the last 72 hours. Thyroid Function Tests: No results for input(s): "TSH", "T4TOTAL", "FREET4", "T3FREE", "THYROIDAB" in the last 72 hours. Anemia Panel: No results for input(s): "VITAMINB12", "FOLATE", "FERRITIN", "TIBC", "IRON", "RETICCTPCT" in the last 72 hours. Urine analysis:    Component Value Date/Time   COLORURINE STRAW (A) 09/19/2021 1247   APPEARANCEUR CLEAR (A) 09/19/2021 1247   APPEARANCEUR Clear 04/15/2014 1240   LABSPEC 1.009 09/19/2021 1247   LABSPEC 1.013 04/15/2014 1240   PHURINE 5.0 09/19/2021 1247   GLUCOSEU 150 (A) 09/19/2021 1247   GLUCOSEU Negative 04/15/2014 1240   HGBUR NEGATIVE 09/19/2021 1247   BILIRUBINUR NEGATIVE 09/19/2021 1247   BILIRUBINUR Negative 04/15/2014 1240   KETONESUR NEGATIVE 09/19/2021 1247   PROTEINUR NEGATIVE 09/19/2021 1247   NITRITE NEGATIVE 09/19/2021 1247   LEUKOCYTESUR NEGATIVE 09/19/2021 1247   LEUKOCYTESUR Negative 04/15/2014 1240   Sepsis Labs: '@LABRCNTIP'$ (procalcitonin:4,lacticidven:4)  Recent Results (from the past 240 hour(s))  Gastrointestinal Panel by PCR , Stool     Status: None   Collection Time: 10/21/21  2:13 AM   Specimen: Stool  Result Value Ref Range Status   Campylobacter species NOT DETECTED NOT DETECTED Final   Plesimonas shigelloides NOT DETECTED NOT DETECTED Final   Salmonella species NOT DETECTED NOT DETECTED Final   Yersinia enterocolitica NOT DETECTED NOT DETECTED Final   Vibrio species NOT DETECTED  NOT DETECTED Final   Vibrio cholerae NOT DETECTED NOT DETECTED Final   Enteroaggregative E coli (EAEC) NOT DETECTED NOT DETECTED Final   Enteropathogenic E coli (EPEC) NOT DETECTED NOT DETECTED Final   Enterotoxigenic E coli (ETEC) NOT DETECTED NOT DETECTED  Final   Shiga like toxin producing E coli (STEC) NOT DETECTED NOT DETECTED Final   Shigella/Enteroinvasive E coli (EIEC) NOT DETECTED NOT DETECTED Final   Cryptosporidium NOT DETECTED NOT DETECTED Final   Cyclospora cayetanensis NOT DETECTED NOT DETECTED Final   Entamoeba histolytica NOT DETECTED NOT DETECTED Final   Giardia lamblia NOT DETECTED NOT DETECTED Final   Adenovirus F40/41 NOT DETECTED NOT DETECTED Final   Astrovirus NOT DETECTED NOT DETECTED Final   Norovirus GI/GII NOT DETECTED NOT DETECTED Final   Rotavirus A NOT DETECTED NOT DETECTED Final   Sapovirus (I, II, IV, and V) NOT DETECTED NOT DETECTED Final    Comment: Performed at [facility], [address] [place], [place] [address]         Radiology Studies last 96 hours: DG Chest Port 1 View  Result Date: 10/20/2021 CLINICAL DATA:  Pulmonary edema EXAM: PORTABLE CHEST 1 VIEW COMPARISON:  10/13/2021 FINDINGS: Tracheostomy tube in place. Cardiomegaly and vascular pedicle widening with vascular congestion. Some atelectasis is likely superimposed. Remote left rib fractures. No visible effusion or pneumothorax. Lung volumes are low. IMPRESSION: Cardiomegaly and vascular congestion similar to prior. Electronically Signed   By: Jorje Guild M.D.   On: 10/20/2021 05:44            LOS: 52 days      Emeterio Reeve, DO [facility] 10/21/2021, 9:01 AM   Staff may message me via secure chat in [place]  but this may not receive immediate response,  please page for urgent matters!  If 7PM-7AM, please contact night-coverage [email or web address]  Dictation software was used to generate the above note. Typos may occur and escape review, as with typed/written notes. Please contact Dr Sheppard Coil directly for clarity if needed.

## 2021-10-21 NOTE — Progress Notes (Signed)
Pt. Suctionned for small amt. Of thick white secretions. Pt. Tolerated well.

## 2021-10-21 NOTE — Progress Notes (Signed)
[facility]  ROUNDING NOTE   Subjective:   Gary [name] is a 66 year old male with past medical conditions including COPD requiring 2 L nasal cannula, and congestive heart failure.  Patient presents to the emergency department with shortness of breath.  Patient has been admitted for COPD exacerbation ([place]) [J44.1] Obesity hypoventilation syndrome ([place]) [E66.2] Acute on chronic diastolic congestive heart failure ([name]) [I50.33] Acute respiratory failure with hypoxia and hypercapnia ([name]) [J96.01, J96.02] Acute on chronic respiratory failure with hypoxia and hypercapnia ([name]) [K93.26, J96.22]  Patient is known and has recently been followed by Dr. Holley Raring for acute kidney injury, resolved at his May appointment.    Patient was seen today in ICU. Patient is opening eyes and does respond to questions Remains on trach collar 35%, did not require ventilator support overnight. Tube feeds continue through PEG tube    07/07 0701 - 07/08 0700 In: 3845 [P.O.:480; NG/GT:3050; IV Piggyback:50] Out: 1000 [Urine:1000] Lab Results  Component Value Date   CREATININE 1.40 (H) 10/21/2021   CREATININE 1.38 (H) 10/20/2021   CREATININE 1.40 (H) 10/20/2021     Objective:  Vital signs in last 24 hours:  Temp:  [98.5 F (36.9 C)-99.1 F (37.3 C)] 98.7 F (37.1 C) (07/08 1706) Pulse Rate:  [82-109] 109 (07/08 1706) Resp:  [14-36] 20 (07/08 1706) BP: (97-129)/(62-85) 118/73 (07/08 1706) SpO2:  [90 %-100 %] 90 % (07/08 1706) FiO2 (%):  [35 %] 35 % (07/08 1706) Weight:  [193 kg] 193 kg (07/08 0400)  Weight change:  Filed Weights   10/14/21 0800 10/18/21 0330 10/21/21 0400  Weight: (!) 180 kg (!) 169 kg (!) 193 kg    Intake/Output: I/O last 3 completed shifts: In: 5457.3 [P.O.:960; Other:505; ZT/IW:5809.9; IV Piggyback:50] Out: 2200 [Urine:2200]   Intake/Output this shift:  No intake/output data recorded.  Physical Exam: General: NAD  Head: Normocephalic, atraumatic.    Eyes: Anicteric  Lungs:  Diminished bases, Trach 35% FiO2  Heart: Regular rhythm  Abdomen:  Soft, nontender, obese, PEG tube in place  Extremities: No peripheral edema.  Neurologic: Alert, respond to simple questions  Skin: No lesions  Access: None    Basic Metabolic Panel: Recent Labs  Lab 10/17/21 0418 10/18/21 0456 10/19/21 0328 10/20/21 0426 10/21/21 0736  NA 151* 149* 147* 146*  148* 146*  K 3.4* 3.0* 3.8 3.2*  3.3* 3.8  CL 120* 116* 113* 113*  115* 111  CO2 '28 29 29 28  30 30  '$ GLUCOSE 113* 100* 95 92  93 112*  BUN 82* 69* 69* 65*  67* 53*  CREATININE 1.93* 1.69* 1.59* 1.38*  1.40* 1.40*  CALCIUM 8.5* 8.5* 8.3* 8.6*  8.6* 8.6*  MG 2.0 1.9 1.7 1.8 2.1  PHOS 2.4* 2.0* 2.4* 2.0* 4.7*    Liver Function Tests: Recent Labs  Lab 10/17/21 0418 10/18/21 0456 10/19/21 0328 10/20/21 0426 10/21/21 0736  ALBUMIN 2.3* 2.3* 2.4* 2.5* 2.4*   No results for input(s): "LIPASE", "AMYLASE" in the last 168 hours. No results for input(s): "AMMONIA" in the last 168 hours.  CBC: Recent Labs  Lab 10/17/21 0418 10/18/21 0456 10/19/21 0328 10/20/21 0426 10/21/21 0736  WBC 6.5 6.3 6.2 6.3 6.6  NEUTROABS 3.8 3.1 3.1 4.5 4.0  HGB 8.6* 8.8* 8.5* 9.1* 8.6*  HCT 31.2* 31.6* 30.9* 33.2* 32.0*  MCV 81.7 81.9 82.2 82.6 84.4  PLT 213 262 269 232 224    Cardiac Enzymes: No results for input(s): "CKTOTAL", "CKMB", "CKMBINDEX", "TROPONINI" in the last 168 hours.  BNP: Invalid input(s): "POCBNP"  CBG: Recent Labs  Lab 10/20/21 2355 10/21/21 0401 10/21/21 0734 10/21/21 1149 10/21/21 1621  GLUCAP 112* 116* 111* 142* 109*    Microbiology: Results for orders placed or performed during the hospital encounter of 09/19/21  SARS Coronavirus 2 by RT PCR (hospital order, performed in [facility] hospital lab) *cepheid single result test* Anterior Nasal Swab     Status: None   Collection Time: 09/19/21  9:57 AM   Specimen: Anterior Nasal Swab  Result Value Ref Range Status    SARS Coronavirus 2 by RT PCR NEGATIVE NEGATIVE Final    Comment: (NOTE) SARS-CoV-2 target nucleic acids are NOT DETECTED.  The SARS-CoV-2 RNA is generally detectable in upper and lower respiratory specimens during the acute phase of infection. The lowest concentration of SARS-CoV-2 viral copies this assay can detect is 250 copies / mL. A negative result does not preclude SARS-CoV-2 infection and should not be used as the sole basis for treatment or other patient management decisions.  A negative result may occur with improper specimen collection / handling, submission of specimen other than nasopharyngeal swab, presence of viral mutation(s) within the areas targeted by this assay, and inadequate number of viral copies (<250 copies / mL). A negative result must be combined with clinical observations, patient history, and epidemiological information.  Fact Sheet for Patients:   https://www.patel.info/  Fact Sheet for Healthcare Providers: https://hall.com/  This test is not yet approved or  cleared by the [place] FDA and has been authorized for detection and/or diagnosis of SARS-CoV-2 by FDA under an Emergency Use Authorization (EUA).  This EUA will remain in effect (meaning this test can be used) for the duration of the COVID-19 declaration under Section 564(b)(1) of the Act, 21 U.S.C. section 360bbb-3(b)(1), unless the authorization is terminated or revoked sooner.  Performed at [facility], [address] [place], [place] [address]   Culture, Respiratory w Gram Stain     Status: None   Collection Time: 09/19/21 11:55 AM   Specimen: SPU  Result Value Ref Range Status   Specimen Description   Final    SPUTUM Performed at [facility], [place] [address] [place], [place] [address]    Special Requests   Final    NONE Performed at [facility], [place], [place] [address]    Gram Stain    Final    FEW WBC PRESENT, PREDOMINANTLY PMN FEW GRAM POSITIVE COCCI IN PAIRS Performed at [facility], [place] [address] [place], [place] [address]    Culture RARE [name]  Final   Report Status 09/24/2021 FINAL  Final   Organism ID, Bacteria [name]  Final      Susceptibility   [name] - MIC*    CEFTAZIDIME 4 SENSITIVE Sensitive     CIPROFLOXACIN <=0.25 SENSITIVE Sensitive     GENTAMICIN 2 SENSITIVE Sensitive     IMIPENEM 1 SENSITIVE Sensitive     PIP/TAZO 8 SENSITIVE Sensitive     CEFEPIME 2 SENSITIVE Sensitive     * RARE [name]  MRSA Next Gen by PCR, Nasal     Status: None   Collection Time: 09/19/21  6:10 PM   Specimen: Nasal Mucosa; Nasal Swab  Result Value Ref Range Status   MRSA by PCR Next Gen NOT DETECTED NOT DETECTED Final    Comment: (NOTE) The GeneXpert MRSA Assay (FDA approved for NASAL specimens only), is one component of a  comprehensive MRSA colonization surveillance program. It is not intended to diagnose MRSA infection nor to guide or monitor treatment for MRSA infections. Test performance is not FDA approved in patients less than 6 years old. Performed at [facility], [address] [place], [place] [address]   Gastrointestinal Panel by PCR , Stool     Status: Abnormal   Collection Time: 09/21/21 10:53 AM   Specimen: Stool  Result Value Ref Range Status   Campylobacter species NOT DETECTED NOT DETECTED Final   Plesimonas shigelloides NOT DETECTED NOT DETECTED Final   Salmonella species NOT DETECTED NOT DETECTED Final   Yersinia enterocolitica DETECTED (A) NOT DETECTED Final    Comment: RESULT CALLED TO, READ BACK BY AND VERIFIED WITH: [facility] JACKSON AT 1126 09/22/21.PMF    Vibrio species NOT DETECTED NOT DETECTED Final   Vibrio cholerae NOT DETECTED NOT DETECTED Final   Enteroaggregative E coli (EAEC) NOT DETECTED NOT DETECTED Final   Enteropathogenic E coli (EPEC) NOT DETECTED  NOT DETECTED Final   Enterotoxigenic E coli (ETEC) NOT DETECTED NOT DETECTED Final   Shiga like toxin producing E coli (STEC) NOT DETECTED NOT DETECTED Final   Shigella/Enteroinvasive E coli (EIEC) NOT DETECTED NOT DETECTED Final   Cryptosporidium NOT DETECTED NOT DETECTED Final   Cyclospora cayetanensis NOT DETECTED NOT DETECTED Final   Entamoeba histolytica NOT DETECTED NOT DETECTED Final   Giardia lamblia NOT DETECTED NOT DETECTED Final   Adenovirus F40/41 NOT DETECTED NOT DETECTED Final   Astrovirus NOT DETECTED NOT DETECTED Final   Norovirus GI/GII NOT DETECTED NOT DETECTED Final   Rotavirus A NOT DETECTED NOT DETECTED Final   Sapovirus (I, II, IV, and V) NOT DETECTED NOT DETECTED Final    Comment: Performed at [facility], [address] [place], [place] [address]  C Difficile Quick Screen w PCR reflex     Status: None   Collection Time: 09/21/21 11:30 AM   Specimen: STOOL  Result Value Ref Range Status   C Diff antigen NEGATIVE NEGATIVE Final   C Diff toxin NEGATIVE NEGATIVE Final   C Diff interpretation No C. difficile detected.  Final    Comment: Performed at [facility], [address] [place], [place] [address]  Culture, blood (Routine X 2) w Reflex to ID Panel     Status: None   Collection Time: 09/21/21 12:28 PM   Specimen: BLOOD  Result Value Ref Range Status   Specimen Description BLOOD BLOOD RIGHT HAND  Final   Special Requests   Final    BOTTLES DRAWN AEROBIC AND ANAEROBIC Blood Culture adequate volume   Culture   Final    NO GROWTH 5 DAYS Performed at [facility], [address], [place], [place] [address]    Report Status 09/26/2021 FINAL  Final  Culture, blood (Routine X 2) w Reflex to ID Panel     Status: None   Collection Time: 09/21/21  2:08 PM   Specimen: BLOOD  Result Value Ref Range Status   Specimen Description BLOOD BLOOD LEFT HAND  Final   Special Requests   Final    BOTTLES DRAWN AEROBIC AND ANAEROBIC Blood  Culture adequate volume   Culture   Final    NO GROWTH 5 DAYS Performed at [facility], [address] [place], [name] [address]    Report Status 09/26/2021 FINAL  Final  Respiratory (~20 pathogens) panel by PCR     Status: None   Collection Time: 09/21/21  6:14 PM  Specimen: Nasopharyngeal Swab; Respiratory  Result Value Ref Range Status   Adenovirus NOT DETECTED NOT DETECTED Final   Coronavirus 229E NOT DETECTED NOT DETECTED Final    Comment: (NOTE) The Coronavirus on the Respiratory Panel, DOES NOT test for the novel  Coronavirus (2019 nCoV)    Coronavirus HKU1 NOT DETECTED NOT DETECTED Final   Coronavirus NL63 NOT DETECTED NOT DETECTED Final   Coronavirus OC43 NOT DETECTED NOT DETECTED Final   Metapneumovirus NOT DETECTED NOT DETECTED Final   Rhinovirus / Enterovirus NOT DETECTED NOT DETECTED Final   Influenza A NOT DETECTED NOT DETECTED Final   Influenza B NOT DETECTED NOT DETECTED Final   Parainfluenza Virus 1 NOT DETECTED NOT DETECTED Final   Parainfluenza Virus 2 NOT DETECTED NOT DETECTED Final   Parainfluenza Virus 3 NOT DETECTED NOT DETECTED Final   Parainfluenza Virus 4 NOT DETECTED NOT DETECTED Final   Respiratory Syncytial Virus NOT DETECTED NOT DETECTED Final   Bordetella pertussis NOT DETECTED NOT DETECTED Final   Bordetella Parapertussis NOT DETECTED NOT DETECTED Final   Chlamydophila pneumoniae NOT DETECTED NOT DETECTED Final   Mycoplasma pneumoniae NOT DETECTED NOT DETECTED Final    Comment: Performed at [facility], [place] [place], [place] [address]  Culture, Respiratory w Gram Stain     Status: None   Collection Time: 10/06/21 10:59 AM   Specimen: Tracheal Aspirate; Respiratory  Result Value Ref Range Status   Specimen Description   Final    TRACHEAL ASPIRATE Performed at [facility], [address], [place], [place] [address]    Special Requests   Final    NONE Performed at [facility], [address] [place], [place] [address]    Gram Stain   Final    ABUNDANT WBC PRESENT, PREDOMINANTLY PMN ABUNDANT GRAM NEGATIVE RODS Performed at [facility], [place] [address] [place], [place] [address]    Culture ABUNDANT [name]  Final   Report Status 10/09/2021 FINAL  Final   Organism ID, Bacteria [name]  Final      Susceptibility   [name] - MIC*    CEFTAZIDIME 8 SENSITIVE Sensitive     CIPROFLOXACIN <=0.25 SENSITIVE Sensitive     GENTAMICIN 2 SENSITIVE Sensitive     IMIPENEM 1 SENSITIVE Sensitive     * ABUNDANT [name]  Gastrointestinal Panel by PCR , Stool     Status: None   Collection Time: 10/21/21  2:13 AM   Specimen: Stool  Result Value Ref Range Status   Campylobacter species NOT DETECTED NOT DETECTED Final   Plesimonas shigelloides NOT DETECTED NOT DETECTED Final   Salmonella species NOT DETECTED NOT DETECTED Final   Yersinia enterocolitica NOT DETECTED NOT DETECTED Final   Vibrio species NOT DETECTED NOT DETECTED Final   Vibrio cholerae NOT DETECTED NOT DETECTED Final   Enteroaggregative E coli (EAEC) NOT DETECTED NOT DETECTED Final   Enteropathogenic E coli (EPEC) NOT DETECTED NOT DETECTED Final   Enterotoxigenic E coli (ETEC) NOT DETECTED NOT DETECTED Final   Shiga like toxin producing E coli (STEC) NOT DETECTED NOT DETECTED Final   Shigella/Enteroinvasive E coli (EIEC) NOT DETECTED NOT DETECTED Final   Cryptosporidium NOT DETECTED NOT DETECTED Final   Cyclospora cayetanensis NOT DETECTED NOT DETECTED Final   Entamoeba histolytica NOT DETECTED NOT DETECTED Final   Giardia lamblia NOT DETECTED NOT DETECTED Final   Adenovirus F40/41 NOT DETECTED NOT DETECTED Final   Astrovirus NOT DETECTED NOT DETECTED Final  Norovirus GI/GII NOT DETECTED NOT DETECTED Final   Rotavirus A NOT DETECTED NOT DETECTED Final   Sapovirus (I, II, IV, and V) NOT DETECTED NOT DETECTED Final    Comment: Performed at [facility], [address] [place], [place] [address]    Coagulation Studies: No results for input(s): "LABPROT", "INR" in the last 72 hours.  Urinalysis: No results for input(s): "COLORURINE", "LABSPEC", "PHURINE", "GLUCOSEU", "HGBUR", "BILIRUBINUR", "KETONESUR", "PROTEINUR", "UROBILINOGEN", "NITRITE", "LEUKOCYTESUR" in the last 72 hours.  Invalid input(s): "APPERANCEUR"    Imaging: DG Chest Port 1 View  Result Date: 10/20/2021 CLINICAL DATA:  Pulmonary edema EXAM: PORTABLE CHEST 1 VIEW COMPARISON:  10/13/2021 FINDINGS: Tracheostomy tube in place. Cardiomegaly and vascular pedicle widening with vascular congestion. Some atelectasis is likely superimposed. Remote left rib fractures. No visible effusion or pneumothorax. Lung volumes are low. IMPRESSION: Cardiomegaly and vascular congestion similar to prior. Electronically Signed   By: Jorje Guild M.D.   On: 10/20/2021 05:44     Medications:    sodium chloride Stopped (10/07/21 0542)   feeding supplement (NEPRO CARB STEADY) 1,000 mL (10/21/21 0958)    atorvastatin  10 mg Per Tube Daily   budesonide (PULMICORT) nebulizer solution  0.5 mg Nebulization BID   Chlorhexidine Gluconate Cloth  6 each Topical Daily   cholecalciferol  2,000 Units Per Tube Daily   epoetin (EPOGEN/PROCRIT) injection  20,000 Units Subcutaneous Weekly   feeding supplement (PROSource TF)  45 mL Per Tube QID   free water  200 mL Per Tube Q4H   heparin injection (subcutaneous)  5,000 Units Subcutaneous Q8H   heparin sodium (porcine)  2,000 Units Intravenous Q M,W,F-HD   ipratropium-albuterol  3 mL Nebulization BID   multivitamin  1 tablet Per Tube QHS   nutrition supplement (JUVEN)  1 packet Per Tube BID BM   mouth rinse  15 mL Mouth Rinse 4 times per day   pantoprazole sodium  40 mg Per Tube BID   traZODone  50 mg Oral QHS   acetaminophen, acetaminophen, albuterol, alteplase, bisacodyl, clonazepam, heparin, HYDROmorphone (DILAUDID) injection, loperamide,  mouth rinse, oxyCODONE, pentafluoroprop-tetrafluoroeth, polyethylene glycol, QUEtiapine  Assessment/ Plan:  [name] is a 66 y.o.  male with past medical conditions including COPD requiring 2 L nasal cannula, and congestive heart failure.  Patient presents to the emergency department with shortness of breath.  Patient has been admitted for COPD exacerbation ([place]) [J44.1] Obesity hypoventilation syndrome ([place]) [E66.2] Acute on chronic diastolic congestive heart failure ([name]) [I50.33] Acute respiratory failure with hypoxia and hypercapnia ([name]) [J96.01, J96.02] Acute on chronic respiratory failure with hypoxia and hypercapnia ([name]) [F75.10, J96.22]    Acute Kidney Injury cause unidentified.  Patient did not have any IV contrast exposure and had only a brief hypotensive event.  -Renal Ultrasound did show  mild right obstruction , simple cyst in  left kidney and bladder distendion.   Patient does not require renal placement therapy at this time as patient creatinine has improved patient peak creatinine was at 4.87 now patient creatinine is stable at around 1.4 Patient now most likely has CKD stage IIIa from his current GFR. Patient CKD is most likely from his history of multiple AKI's  Lab Results  Component Value Date   CREATININE 1.40 (H) 10/21/2021   CREATININE 1.38 (H) 10/20/2021   CREATININE 1.40 (H) 10/20/2021    Intake/Output Summary (Last 24 hours) at 10/21/2021 1953 Last data filed at 10/21/2021 1855 Gross per 24 hour  Intake 3527.33 ml  Output  1500 ml  Net 2027.33 ml   Acute respiratory failure, Pseudomonas noted in sputum on September 19, 2021. IV antibiotics completed Monitoring of respiratory status as per pulmonary/critical care. -Did not require ventilator support overnight, remains on trach collar at 35% FiO2  Chronic diastolic heart failure.  Echo from 08/07/2021 shows EF 60 to 65% with a mildly dilated left ventricular cavity and grade 1 diastolic  dysfunction. Patient is currently well compensated  Anemia of critical illness, Hemoglobin 9.1, continue weekly subcu EPO 20,000 units.   Hypernatremia/Hypokalemia Improving    LOS: 66 Emmersyn Kratzke s Tamyra Fojtik 7/8/20237:53 PM

## 2021-10-21 NOTE — Progress Notes (Signed)
Trach suctioned for mod amt thick tan secretions 

## 2021-10-21 NOTE — Progress Notes (Signed)
[facility] for Electrolyte Monitoring and Replacement   Recent Labs: Potassium (mmol/L)  Date Value  10/21/2021 3.8  04/15/2014 3.9   Magnesium (mg/dL)  Date Value  10/21/2021 2.1  08/21/2012 1.9   Calcium (mg/dL)  Date Value  10/21/2021 8.6 (L)   Calcium, Total (mg/dL)  Date Value  04/15/2014 8.3 (L)   Albumin (g/dL)  Date Value  10/21/2021 2.4 (L)  04/15/2014 2.9 (L)   Phosphorus (mg/dL)  Date Value  10/21/2021 4.7 (H)   Sodium (mmol/L)  Date Value  10/21/2021 146 (H)  03/15/2020 145 (H)  04/15/2014 140    Assessment: 66 y/o male with h/o PUD, OSA, HTN, CKD III, substance abuse, CHF, Hep C and COPD and COVID 19 (02/2021) who is admitted with COPD exacerbation, Pseudomonas pneumonia on ceftazidime, HFpEF, and AKI. Intubated and on fentanyl. Pharmacy is asked to follow and replace electrolytes while in CCU.  G-tube placed by IR 6/30  Nutrition: Tube feeds at 70 cc/hr + Prosource TID + Juven BIDM  Patient is experiencing renal recovery. Nephrology following, holding further dialysis at this point.  Goal of Therapy:  Electrolytes within normal limits  Plan:  --Hypernatremia stable / resolving; continue free water flushes 200 mL per tube q4h --K 3.8; Kcl 40 mEq per tube x 1 dose --Follow-up electrolytes with AM labs tomorrow  Pearla Dubonnet 10/21/2021 9:35 AM

## 2021-10-21 NOTE — Progress Notes (Signed)
Patient transferred to 247, report given to 2A RN. All belongings with patient.

## 2021-10-21 NOTE — Progress Notes (Signed)
SLP Cancellation Note  Patient Details Name: Gary [name] MRN: [id number] DOB: 1955/07/31   Cancelled treatment:       Reason Eval/Treat Not Completed: Other (comment)  Pt sleeping and requested SLP to come back. Spoke with pt's nurse who reports that pt consumed breakfast with no overt s/s of aspiration.   Mayci Haning B. Rutherford Nail, M.S., CCC-SLP, Mining engineer Certified Brain Injury [place]  [place] Office [phone] Ascom [phone] Fax [phone]

## 2021-10-22 DIAGNOSIS — F331 Major depressive disorder, recurrent, moderate: Secondary | ICD-10-CM

## 2021-10-22 DIAGNOSIS — J9621 Acute and chronic respiratory failure with hypoxia: Secondary | ICD-10-CM | POA: Diagnosis not present

## 2021-10-22 DIAGNOSIS — Z7189 Other specified counseling: Secondary | ICD-10-CM

## 2021-10-22 DIAGNOSIS — J9622 Acute and chronic respiratory failure with hypercapnia: Secondary | ICD-10-CM | POA: Diagnosis not present

## 2021-10-22 LAB — CBC WITH DIFFERENTIAL/PLATELET
Abs Immature Granulocytes: 0.09 10*3/uL — ABNORMAL HIGH (ref 0.00–0.07)
Basophils Absolute: 0 10*3/uL (ref 0.0–0.1)
Basophils Relative: 0 %
Eosinophils Absolute: 0.2 10*3/uL (ref 0.0–0.5)
Eosinophils Relative: 2 %
HCT: 34.1 % — ABNORMAL LOW (ref 39.0–52.0)
Hemoglobin: 9.2 g/dL — ABNORMAL LOW (ref 13.0–17.0)
Immature Granulocytes: 1 %
Lymphocytes Relative: 18 %
Lymphs Abs: 1.4 10*3/uL (ref 0.7–4.0)
MCH: 22.6 pg — ABNORMAL LOW (ref 26.0–34.0)
MCHC: 27 g/dL — ABNORMAL LOW (ref 30.0–36.0)
MCV: 83.8 fL (ref 80.0–100.0)
Monocytes Absolute: 1 10*3/uL (ref 0.1–1.0)
Monocytes Relative: 12 %
Neutro Abs: 5.3 10*3/uL (ref 1.7–7.7)
Neutrophils Relative %: 67 %
Platelets: 171 10*3/uL (ref 150–400)
RBC: 4.07 MIL/uL — ABNORMAL LOW (ref 4.22–5.81)
RDW: 17.3 % — ABNORMAL HIGH (ref 11.5–15.5)
WBC: 7.9 10*3/uL (ref 4.0–10.5)
nRBC: 0.5 % — ABNORMAL HIGH (ref 0.0–0.2)

## 2021-10-22 LAB — BASIC METABOLIC PANEL
Anion gap: 7 (ref 5–15)
BUN: 39 mg/dL — ABNORMAL HIGH (ref 8–23)
CO2: 30 mmol/L (ref 22–32)
Calcium: 8.6 mg/dL — ABNORMAL LOW (ref 8.9–10.3)
Chloride: 110 mmol/L (ref 98–111)
Creatinine, Ser: 1.12 mg/dL (ref 0.61–1.24)
GFR, Estimated: >60 mL/min (ref >60)
Glucose, Bld: 111 mg/dL — ABNORMAL HIGH (ref 70–99)
Potassium: 3.9 mmol/L (ref 3.5–5.1)
Sodium: 147 mmol/L — ABNORMAL HIGH (ref 135–145)

## 2021-10-22 LAB — RENAL FUNCTION PANEL
Albumin: 2.6 g/dL — ABNORMAL LOW (ref 3.5–5.0)
Anion gap: 6 (ref 5–15)
BUN: 40 mg/dL — ABNORMAL HIGH (ref 8–23)
CO2: 31 mmol/L (ref 22–32)
Calcium: 8.7 mg/dL — ABNORMAL LOW (ref 8.9–10.3)
Chloride: 110 mmol/L (ref 98–111)
Creatinine, Ser: 1.13 mg/dL (ref 0.61–1.24)
GFR, Estimated: >60 mL/min (ref >60)
Glucose, Bld: 112 mg/dL — ABNORMAL HIGH (ref 70–99)
Phosphorus: 2.1 mg/dL — ABNORMAL LOW (ref 2.5–4.6)
Potassium: 3.8 mmol/L (ref 3.5–5.1)
Sodium: 147 mmol/L — ABNORMAL HIGH (ref 135–145)

## 2021-10-22 LAB — MAGNESIUM: Magnesium: 1.6 mg/dL — ABNORMAL LOW (ref 1.7–2.4)

## 2021-10-22 MED ORDER — LORAZEPAM 2 MG/ML IJ SOLN
2.0000 mg | Freq: Once | INTRAMUSCULAR | Status: AC
  Administered 2021-10-22: 2 mg via INTRAVENOUS
  Filled 2021-10-22: qty 1

## 2021-10-22 MED ORDER — ESCITALOPRAM OXALATE 10 MG PO TABS
5.0000 mg | ORAL_TABLET | Freq: Every day | ORAL | Status: DC
  Administered 2021-10-22 – 2021-12-13 (×49): 5 mg via ORAL
  Filled 2021-10-22: qty 1
  Filled 2021-10-22: qty 0.5
  Filled 2021-10-22 (×18): qty 1
  Filled 2021-10-22: qty 0.5
  Filled 2021-10-22 (×7): qty 1
  Filled 2021-10-22: qty 0.5
  Filled 2021-10-22 (×5): qty 1
  Filled 2021-10-22 (×2): qty 0.5
  Filled 2021-10-22 (×10): qty 1
  Filled 2021-10-22: qty 0.5
  Filled 2021-10-22 (×2): qty 1
  Filled 2021-10-22 (×2): qty 0.5
  Filled 2021-10-22: qty 1

## 2021-10-22 NOTE — Progress Notes (Signed)
[facility]  ROUNDING NOTE   Subjective:   Gary [name] is a 66 year old male with past medical conditions including COPD requiring 2 L nasal cannula, and congestive heart failure.  Patient presents to the emergency department with shortness of breath.  Patient has been admitted for COPD exacerbation ([place]) [J44.1] Obesity hypoventilation syndrome ([place]) [E66.2] Acute on chronic diastolic congestive heart failure ([name]) [I50.33] Acute respiratory failure with hypoxia and hypercapnia ([name]) [J96.01, J96.02] Acute on chronic respiratory failure with hypoxia and hypercapnia ([name]) [Z00.17, J96.22]  Patient is known and has recently been followed by Dr. Holley Raring for acute kidney injury, resolved at his May appointment.      Patient had multiple complaints in today's visit. Patient initially complaint was that he wants to leave the hospital.  I again educated patient against leaving [place].  I did inform the patient that it was his right but I would suggest him not to do so.  I then asked the patient to please tell me what are his concerns Patient major concern was it takes floor long time to feed . Patient next major concern was he wanted to sit to the bed to bed. Patient next major concern was if he could be discharged home. I then educated patient about need for OT PT help before he could be directed to his such as getting out of the bed/walking.  I did discuss the patient that he has been in ICU and now he was just moved out of ICU to second floor.  I did apologize to the patient regarding delay in feeding him.  I did discuss this with patient's primary team/nursing team.During the discussion with the primary team I also reviewed patient's primary team's note and saw the patient was discussing similar issues with them as well. -I did discuss the patient about his kidney related issues  07/08 0701 - 07/09 0700 In: 2095.3 [P.O.:480; NG/GT:1375.3] Out: 1750  [Urine:1750] Lab Results  Component Value Date   CREATININE 1.13 10/22/2021   CREATININE 1.12 10/22/2021   CREATININE 1.40 (H) 10/21/2021     Objective:  Vital signs in last 24 hours:  Temp:  [98 F (36.7 C)-98.7 F (37.1 C)] 98 F (36.7 C) (07/09 0400) Pulse Rate:  [30-130] 86 (07/09 0400) Resp:  [11-42] 22 (07/09 0400) BP: (115-126)/(72-86) 126/86 (07/09 0400) SpO2:  [84 %-100 %] 100 % (07/09 0400) FiO2 (%):  [28 %-35 %] 28 % (07/09 0824) Weight:  [143 kg] 143 kg (07/09 0500)  Weight change: -50 kg Filed Weights   10/18/21 0330 10/21/21 0400 10/22/21 0500  Weight: (!) 169 kg (!) 193 kg (!) 143 kg    Intake/Output: I/O last 3 completed shifts: In: 4010.3 [P.O.:960; Other:305; NG/GT:2745.3] Out: 2050 [Urine:2050]   Intake/Output this shift:  No intake/output data recorded.  Physical Exam: General: NAD  Head: Normocephalic, atraumatic.   Eyes: Anicteric  Lungs:  Diminished bases, Trach 35% FiO2  Heart: Regular rhythm  Abdomen:  Soft, nontender, obese, PEG tube in place  Extremities: No peripheral edema.  Neurologic: Alert, respond to simple questions  Skin: No lesions  Access: None    Basic Metabolic Panel: Recent Labs  Lab 10/18/21 0456 10/19/21 0328 10/20/21 0426 10/21/21 0736 10/22/21 1310  NA 149* 147* 146*  148* 146* 147*  147*  K 3.0* 3.8 3.2*  3.3* 3.8 3.8  3.9  CL 116* 113* 113*  115* 111 110  110  CO2 '29 29 28  30 30 31  '$ 30  GLUCOSE 100* 95 92  93 112* 112*  111*  BUN 69* 69* 65*  67* 53* 40*  39*  CREATININE 1.69* 1.59* 1.38*  1.40* 1.40* 1.13  1.12  CALCIUM 8.5* 8.3* 8.6*  8.6* 8.6* 8.7*  8.6*  MG 1.9 1.7 1.8 2.1 1.6*  PHOS 2.0* 2.4* 2.0* 4.7* 2.1*    Liver Function Tests: Recent Labs  Lab 10/18/21 0456 10/19/21 0328 10/20/21 0426 10/21/21 0736 10/22/21 1310  ALBUMIN 2.3* 2.4* 2.5* 2.4* 2.6*   No results for input(s): "LIPASE", "AMYLASE" in the last 168 hours. No results for input(s): "AMMONIA" in the last 168  hours.  CBC: Recent Labs  Lab 10/18/21 0456 10/19/21 0328 10/20/21 0426 10/21/21 0736 10/22/21 1310  WBC 6.3 6.2 6.3 6.6 7.9  NEUTROABS 3.1 3.1 4.5 4.0 5.3  HGB 8.8* 8.5* 9.1* 8.6* 9.2*  HCT 31.6* 30.9* 33.2* 32.0* 34.1*  MCV 81.9 82.2 82.6 84.4 83.8  PLT 262 269 232 224 171    Cardiac Enzymes: No results for input(s): "CKTOTAL", "CKMB", "CKMBINDEX", "TROPONINI" in the last 168 hours.  BNP: Invalid input(s): "POCBNP"  CBG: Recent Labs  Lab 10/21/21 0401 10/21/21 0734 10/21/21 1149 10/21/21 1621 10/21/21 2115  GLUCAP 116* 111* 142* 109* 113*    Microbiology: Results for orders placed or performed during the hospital encounter of 09/19/21  SARS Coronavirus 2 by RT PCR (hospital order, performed in [facility] hospital lab) *cepheid single result test* Anterior Nasal Swab     Status: None   Collection Time: 09/19/21  9:57 AM   Specimen: Anterior Nasal Swab  Result Value Ref Range Status   SARS Coronavirus 2 by RT PCR NEGATIVE NEGATIVE Final    Comment: (NOTE) SARS-CoV-2 target nucleic acids are NOT DETECTED.  The SARS-CoV-2 RNA is generally detectable in upper and lower respiratory specimens during the acute phase of infection. The lowest concentration of SARS-CoV-2 viral copies this assay can detect is 250 copies / mL. A negative result does not preclude SARS-CoV-2 infection and should not be used as the sole basis for treatment or other patient management decisions.  A negative result may occur with improper specimen collection / handling, submission of specimen other than nasopharyngeal swab, presence of viral mutation(s) within the areas targeted by this assay, and inadequate number of viral copies (<250 copies / mL). A negative result must be combined with clinical observations, patient history, and epidemiological information.  Fact Sheet for Patients:   https://www.patel.info/  Fact Sheet for Healthcare  Providers: https://hall.com/  This test is not yet approved or  cleared by the [place] FDA and has been authorized for detection and/or diagnosis of SARS-CoV-2 by FDA under an Emergency Use Authorization (EUA).  This EUA will remain in effect (meaning this test can be used) for the duration of the COVID-19 declaration under Section 564(b)(1) of the Act, 21 U.S.C. section 360bbb-3(b)(1), unless the authorization is terminated or revoked sooner.  Performed at [facility], [address] [place], [place] [address]   Culture, Respiratory w Gram Stain     Status: None   Collection Time: 09/19/21 11:55 AM   Specimen: SPU  Result Value Ref Range Status   Specimen Description   Final    SPUTUM Performed at [facility], [place] [address], [place], [place] [address]    Special Requests   Final    NONE Performed at [facility], [address] [place], [place] [address]    Gram Stain   Final  FEW WBC PRESENT, PREDOMINANTLY PMN FEW GRAM POSITIVE COCCI IN PAIRS Performed at [facility], [place] [address], [place], [place] [address]    Culture RARE [name]  Final   Report Status 09/24/2021 FINAL  Final   Organism ID, Bacteria [name]  Final      Susceptibility   [name] - MIC*    CEFTAZIDIME 4 SENSITIVE Sensitive     CIPROFLOXACIN <=0.25 SENSITIVE Sensitive     GENTAMICIN 2 SENSITIVE Sensitive     IMIPENEM 1 SENSITIVE Sensitive     PIP/TAZO 8 SENSITIVE Sensitive     CEFEPIME 2 SENSITIVE Sensitive     * RARE [name]  MRSA Next Gen by PCR, Nasal     Status: None   Collection Time: 09/19/21  6:10 PM   Specimen: Nasal Mucosa; Nasal Swab  Result Value Ref Range Status   MRSA by PCR Next Gen NOT DETECTED NOT DETECTED Final    Comment: (NOTE) The GeneXpert MRSA Assay (FDA approved for NASAL specimens only), is one component of a comprehensive MRSA colonization  surveillance program. It is not intended to diagnose MRSA infection nor to guide or monitor treatment for MRSA infections. Test performance is not FDA approved in patients less than 74 years old. Performed at [facility], [address] [place], [place] [address]   Gastrointestinal Panel by PCR , Stool     Status: Abnormal   Collection Time: 09/21/21 10:53 AM   Specimen: Stool  Result Value Ref Range Status   Campylobacter species NOT DETECTED NOT DETECTED Final   Plesimonas shigelloides NOT DETECTED NOT DETECTED Final   Salmonella species NOT DETECTED NOT DETECTED Final   Yersinia enterocolitica DETECTED (A) NOT DETECTED Final    Comment: RESULT CALLED TO, READ BACK BY AND VERIFIED WITH: [facility] [place] AT 1126 09/22/21.PMF    Vibrio species NOT DETECTED NOT DETECTED Final   Vibrio cholerae NOT DETECTED NOT DETECTED Final   Enteroaggregative E coli (EAEC) NOT DETECTED NOT DETECTED Final   Enteropathogenic E coli (EPEC) NOT DETECTED NOT DETECTED Final   Enterotoxigenic E coli (ETEC) NOT DETECTED NOT DETECTED Final   Shiga like toxin producing E coli (STEC) NOT DETECTED NOT DETECTED Final   Shigella/Enteroinvasive E coli (EIEC) NOT DETECTED NOT DETECTED Final   Cryptosporidium NOT DETECTED NOT DETECTED Final   Cyclospora cayetanensis NOT DETECTED NOT DETECTED Final   Entamoeba histolytica NOT DETECTED NOT DETECTED Final   Giardia lamblia NOT DETECTED NOT DETECTED Final   Adenovirus F40/41 NOT DETECTED NOT DETECTED Final   Astrovirus NOT DETECTED NOT DETECTED Final   Norovirus GI/GII NOT DETECTED NOT DETECTED Final   Rotavirus A NOT DETECTED NOT DETECTED Final   Sapovirus (I, II, IV, and V) NOT DETECTED NOT DETECTED Final    Comment: Performed at [facility], [address] [place], [place] [address]  C Difficile Quick Screen w PCR reflex     Status: None   Collection Time: 09/21/21 11:30 AM   Specimen: STOOL  Result Value Ref Range Status   C Diff antigen  NEGATIVE NEGATIVE Final   C Diff toxin NEGATIVE NEGATIVE Final   C Diff interpretation No C. difficile detected.  Final    Comment: Performed at [facility], [address] [place], [place] [address]  Culture, blood (Routine X 2) w Reflex to ID Panel     Status: None   Collection Time: 09/21/21 12:28 PM   Specimen: BLOOD  Result Value Ref Range  Status   Specimen Description BLOOD BLOOD RIGHT HAND  Final   Special Requests   Final    BOTTLES DRAWN AEROBIC AND ANAEROBIC Blood Culture adequate volume   Culture   Final    NO GROWTH 5 DAYS Performed at [facility], [address] [place], [place] [address]    Report Status 09/26/2021 FINAL  Final  Culture, blood (Routine X 2) w Reflex to ID Panel     Status: None   Collection Time: 09/21/21  2:08 PM   Specimen: BLOOD  Result Value Ref Range Status   Specimen Description BLOOD BLOOD LEFT HAND  Final   Special Requests   Final    BOTTLES DRAWN AEROBIC AND ANAEROBIC Blood Culture adequate volume   Culture   Final    NO GROWTH 5 DAYS Performed at [facility], [address] [place], [place] [address]    Report Status 09/26/2021 FINAL  Final  Respiratory (~20 pathogens) panel by PCR     Status: None   Collection Time: 09/21/21  6:14 PM   Specimen: Nasopharyngeal Swab; Respiratory  Result Value Ref Range Status   Adenovirus NOT DETECTED NOT DETECTED Final   Coronavirus 229E NOT DETECTED NOT DETECTED Final    Comment: (NOTE) The Coronavirus on the Respiratory Panel, DOES NOT test for the novel  Coronavirus (2019 nCoV)    Coronavirus HKU1 NOT DETECTED NOT DETECTED Final   Coronavirus NL63 NOT DETECTED NOT DETECTED Final   Coronavirus OC43 NOT DETECTED NOT DETECTED Final   Metapneumovirus NOT DETECTED NOT DETECTED Final   Rhinovirus / Enterovirus NOT DETECTED NOT DETECTED Final   Influenza A NOT DETECTED NOT DETECTED Final   Influenza B NOT DETECTED NOT DETECTED Final   Parainfluenza Virus 1 NOT  DETECTED NOT DETECTED Final   Parainfluenza Virus 2 NOT DETECTED NOT DETECTED Final   Parainfluenza Virus 3 NOT DETECTED NOT DETECTED Final   Parainfluenza Virus 4 NOT DETECTED NOT DETECTED Final   Respiratory Syncytial Virus NOT DETECTED NOT DETECTED Final   Bordetella pertussis NOT DETECTED NOT DETECTED Final   Bordetella Parapertussis NOT DETECTED NOT DETECTED Final   Chlamydophila pneumoniae NOT DETECTED NOT DETECTED Final   Mycoplasma pneumoniae NOT DETECTED NOT DETECTED Final    Comment: Performed at [facility], [place] [address] [place], [place] [address]  Culture, Respiratory w Gram Stain     Status: None   Collection Time: 10/06/21 10:59 AM   Specimen: Tracheal Aspirate; Respiratory  Result Value Ref Range Status   Specimen Description   Final    TRACHEAL ASPIRATE Performed at [facility], [address] [place], [place] [address]    Special Requests   Final    NONE Performed at [facility], [address] [place], [place] [address]    Gram Stain   Final    ABUNDANT WBC PRESENT, PREDOMINANTLY PMN ABUNDANT GRAM NEGATIVE RODS Performed at [facility], [place] [address], [place], [place] [address]    Culture ABUNDANT [name]  Final   Report Status 10/09/2021 FINAL  Final   Organism ID, Bacteria [name]  Final      Susceptibility   [name] - MIC*    CEFTAZIDIME 8 SENSITIVE Sensitive     CIPROFLOXACIN <=0.25 SENSITIVE Sensitive     GENTAMICIN 2 SENSITIVE Sensitive     IMIPENEM 1 SENSITIVE Sensitive     * ABUNDANT [name]  Gastrointestinal Panel by PCR , Stool  Status: None   Collection Time: 10/21/21  2:13 AM   Specimen: Stool  Result Value Ref Range Status   Campylobacter species NOT DETECTED NOT DETECTED Final   Plesimonas shigelloides NOT DETECTED NOT DETECTED Final   Salmonella species NOT DETECTED NOT DETECTED Final   Yersinia enterocolitica NOT DETECTED NOT DETECTED  Final   Vibrio species NOT DETECTED NOT DETECTED Final   Vibrio cholerae NOT DETECTED NOT DETECTED Final   Enteroaggregative E coli (EAEC) NOT DETECTED NOT DETECTED Final   Enteropathogenic E coli (EPEC) NOT DETECTED NOT DETECTED Final   Enterotoxigenic E coli (ETEC) NOT DETECTED NOT DETECTED Final   Shiga like toxin producing E coli (STEC) NOT DETECTED NOT DETECTED Final   Shigella/Enteroinvasive E coli (EIEC) NOT DETECTED NOT DETECTED Final   Cryptosporidium NOT DETECTED NOT DETECTED Final   Cyclospora cayetanensis NOT DETECTED NOT DETECTED Final   Entamoeba histolytica NOT DETECTED NOT DETECTED Final   Giardia lamblia NOT DETECTED NOT DETECTED Final   Adenovirus F40/41 NOT DETECTED NOT DETECTED Final   Astrovirus NOT DETECTED NOT DETECTED Final   Norovirus GI/GII NOT DETECTED NOT DETECTED Final   Rotavirus A NOT DETECTED NOT DETECTED Final   Sapovirus (I, II, IV, and V) NOT DETECTED NOT DETECTED Final    Comment: Performed at [facility], [address] [place], [place] [address]    Coagulation Studies: No results for input(s): "LABPROT", "INR" in the last 72 hours.  Urinalysis: No results for input(s): "COLORURINE", "LABSPEC", "PHURINE", "GLUCOSEU", "HGBUR", "BILIRUBINUR", "KETONESUR", "PROTEINUR", "UROBILINOGEN", "NITRITE", "LEUKOCYTESUR" in the last 72 hours.  Invalid input(s): "APPERANCEUR"    Imaging: No results found.   Medications:    sodium chloride Stopped (10/07/21 0542)   feeding supplement (NEPRO CARB STEADY) 1,000 mL (10/21/21 0958)    atorvastatin  10 mg Per Tube Daily   budesonide (PULMICORT) nebulizer solution  0.5 mg Nebulization BID   Chlorhexidine Gluconate Cloth  6 each Topical Daily   cholecalciferol  2,000 Units Per Tube Daily   epoetin (EPOGEN/PROCRIT) injection  20,000 Units Subcutaneous Weekly   feeding supplement (PROSource TF)  45 mL Per Tube QID   free water  200 mL Per Tube Q4H   heparin injection (subcutaneous)  5,000 Units  Subcutaneous Q8H   heparin sodium (porcine)  2,000 Units Intravenous Q M,W,F-HD   ipratropium-albuterol  3 mL Nebulization BID   multivitamin  1 tablet Per Tube QHS   nutrition supplement (JUVEN)  1 packet Per Tube BID BM   mouth rinse  15 mL Mouth Rinse 4 times per day   pantoprazole sodium  40 mg Per Tube BID   traZODone  50 mg Oral QHS   acetaminophen, acetaminophen, albuterol, alteplase, bisacodyl, clonazepam, heparin, HYDROmorphone (DILAUDID) injection, loperamide, mouth rinse, oxyCODONE, pentafluoroprop-tetrafluoroeth, polyethylene glycol, QUEtiapine  Assessment/ Plan:  Gary [name] is a 66 y.o.  male with past medical conditions including COPD requiring 2 L nasal cannula, and congestive heart failure.  Patient presents to the emergency department with shortness of breath.  Patient has been admitted for COPD exacerbation ([place]) [J44.1] Obesity hypoventilation syndrome ([place]) [E66.2] Acute on chronic diastolic congestive heart failure ([name]) [I50.33] Acute respiratory failure with hypoxia and hypercapnia ([name]) [J96.01, J96.02] Acute on chronic respiratory failure with hypoxia and hypercapnia ([name]) [T06.26, J96.22]    Acute Kidney Injury cause unidentified.  Patient did not have any IV contrast exposure and had only a brief hypotensive event.  -Renal Ultrasound did show  mild right obstruction , simple cyst in  left kidney and bladder distendion.   Patient does not require renal placement therapy at this time as patient creatinine has improved patient peak creatinine was at 4.87 now patient creatinine is stable at around 1.4 Patient now most likely has CKD stage IIIa from his current GFR. Patient CKD is most likely from his history of multiple AKI's  Lab Results  Component Value Date   CREATININE 1.13 10/22/2021   CREATININE 1.12 10/22/2021   CREATININE 1.40 (H) 10/21/2021    Intake/Output Summary (Last 24 hours) at 10/22/2021 1530 Last data filed at 10/22/2021 0600 Gross per  24 hour  Intake 1615.33 ml  Output 1150 ml  Net 465.33 ml   Acute respiratory failure, Pseudomonas noted in sputum on September 19, 2021. IV antibiotics completed Monitoring of respiratory status as per pulmonary/critical care. -Did not require ventilator support overnight, remains on trach collar at 35% FiO2  Chronic diastolic heart failure.  Echo from 08/07/2021 shows EF 60 to 65% with a mildly dilated left ventricular cavity and grade 1 diastolic dysfunction. Patient is currently well compensated  Anemia of critical illness, Hemoglobin 9.1, continue weekly subcu EPO 20,000 units.   Hypernatremia/Hypokalemia Improving   Agitation Patient is being closely followed by the primary team   I spent more than 60 minutes in patient care/care coordination/educating the patient   LOS: [place] 7/9/20233:30 PM

## 2021-10-22 NOTE — TOC Progression Note (Signed)
Transition of Care [facility]) - Progression Note    Patient Details  Name: Gary [name] MRN: [id number] Date of Birth: 1956-01-19  Transition of Care [facility]) CM/SW [place], LCSW Phone Number: 10/22/2021, 9:21 AM  Clinical Narrative:    Patient off vent at night currently.  CSW extended bed search.    Expected Discharge Plan: Long Term Acute Care (LTAC) Barriers to Discharge: Continued Medical Work up  Expected Discharge Plan and Services Expected Discharge Plan: Long Term Acute Care (LTAC)   Discharge Planning Services: CM Consult Post Acute Care Choice: Long Term Acute Care (LTAC) Living arrangements for the past 2 months: Single Family Home                 DME Arranged: N/A DME Agency: NA       HH Arranged: NA HH Agency: NA         Social Determinants of Health (SDOH) Interventions    Readmission Risk Interventions    05/04/2021    1:26 PM 04/07/2021    1:39 PM 03/12/2021   11:54 AM  Readmission Risk Prevention Plan  Transportation Screening Complete Complete Complete  PCP or Specialist Appt within 3-5 Days  Complete Complete  HRI or [place]   Complete  Social Work Consult for [place] Planning/Counseling   Complete  Palliative Care Screening  Not Applicable Not Applicable  Medication Review Press photographer) Complete Complete Complete  PCP or Specialist appointment within 3-5 days of discharge Complete    HRI or [place] Complete    SW Recovery Care/Counseling Consult Complete    [place] Not Applicable

## 2021-10-22 NOTE — Progress Notes (Addendum)
Patient is out of stepdown and on progressive unit.  Called by RN in AM re: agitation.  Concern for thrashing and hitting at staff, taking multiple staff members to try to restrain him for medications/vitals.  I came up to assess.  Was able to calm patient down.  He wants to leave the hospital.  He and I discussed that he is certainly within his rights to refuse medical care, but he is not physically able to leave this hospital under his own power.  Reassured him that my priority is to keep him safe, get him as healthy as I can, and also keep the rest of the staff here safe.  Explained to him that some of the medications he is getting are for his medical conditions, but if he is lashing out and is unsafe towards staff, I am going to be forced to sedate him.  He was upset that he had mitts on his hands, was explained to him that he is trying to pull out trach/tubing in his sleep.  Patient clearly has distrust of the medical team.  RN has been in communication with his sister who will be visiting this afternoon.  Patient and I discussed that once his sister gets here, if she is willing to take him he is within his rights to leave [place].  I told him that would prefer that he stay another few days while we arrange safe placement for him, make sure he is able to tolerate p.o. and may be able to get the [place] tube out, make sure that he is able to sleep consistently without needing ventilatory support and without ripping out tubes and trach.    Mental status exam : patient is alert and oriented and he has the capacity to understand his options as I have laid them out, and understand the consequences of these options including leaving AMA.   Sister will be visiting this afternoon, will reassess then update this progress note.   Have instructed nursing and other clinical and support staff to not administer any medications or bother him unnecessarily until this afternoon when I can have a full discussion  with the patient and his family. Can also defer vital signs, blood draws for now. Tele-sitter ordered.   Have instructed the patient to not try to leave the bed, to call for assistance if needs to use the restroom or if any concerns.  I did make it clear to him that staff would be checking in on him to make sure that his oxygenation is good and his telemetry is not showing any concerns.     These decisions were made in shared decision-making with the patient, who has capacity to refuse medical treatment.    I did make it clear to him that if his respiratory status declines and he becomes confused and then lacks capacity, we will do what we have to do to keep him safe.   Will plan for extensive goals of care discussion this afternoon with him and family.

## 2021-10-22 NOTE — Plan of Care (Signed)
  Problem: Education: Goal: Ability to demonstrate management of disease process will improve Outcome: Progressing Goal: Ability to verbalize understanding of medication therapies will improve Outcome: Progressing   Problem: Activity: Goal: Capacity to carry out activities will improve Outcome: Progressing   Problem: Cardiac: Goal: Ability to achieve and maintain adequate cardiopulmonary perfusion will improve Outcome: Progressing   Problem: Education: Goal: Knowledge of disease or condition will improve Outcome: Progressing Goal: Knowledge of the prescribed therapeutic regimen will improve Outcome: Progressing   Problem: Activity: Goal: Ability to tolerate increased activity will improve Outcome: Progressing Goal: Will verbalize the importance of balancing activity with adequate rest periods Outcome: Progressing   Problem: Respiratory: Goal: Ability to maintain a clear airway will improve Outcome: Progressing Goal: Levels of oxygenation will improve Outcome: Progressing Goal: Ability to maintain adequate ventilation will improve Outcome: Progressing   Problem: Education: Goal: Knowledge of General Education information will improve Description: Including pain rating scale, medication(s)/side effects and non-pharmacologic comfort measures Outcome: Progressing   Problem: Health Behavior/Discharge Planning: Goal: Ability to manage health-related needs will improve Outcome: Progressing   Problem: Clinical Measurements: Goal: Ability to maintain clinical measurements within normal limits will improve Outcome: Progressing Goal: Will remain free from infection Outcome: Progressing Goal: Diagnostic test results will improve Outcome: Progressing Goal: Respiratory complications will improve Outcome: Progressing Goal: Cardiovascular complication will be avoided Outcome: Progressing   Problem: Activity: Goal: Risk for activity intolerance will decrease Outcome:  Progressing   Problem: Nutrition: Goal: Adequate nutrition will be maintained Outcome: Progressing   Problem: Coping: Goal: Level of anxiety will decrease Outcome: Progressing   Problem: Elimination: Goal: Will not experience complications related to bowel motility Outcome: Progressing Goal: Will not experience complications related to urinary retention Outcome: Progressing   Problem: Pain Managment: Goal: General experience of comfort will improve Outcome: Progressing   Problem: Safety: Goal: Ability to remain free from injury will improve Outcome: Progressing   Problem: Skin Integrity: Goal: Risk for impaired skin integrity will decrease Outcome: Progressing   Problem: Education: Goal: Ability to describe self-care measures that may prevent or decrease complications (Diabetes Survival Skills Education) will improve Outcome: Progressing   Problem: Coping: Goal: Ability to adjust to condition or change in health will improve Outcome: Progressing   Problem: Fluid Volume: Goal: Ability to maintain a balanced intake and output will improve Outcome: Progressing   Problem: Health Behavior/Discharge Planning: Goal: Ability to identify and utilize available resources and services will improve Outcome: Progressing Goal: Ability to manage health-related needs will improve Outcome: Progressing   Problem: Metabolic: Goal: Ability to maintain appropriate glucose levels will improve Outcome: Progressing   Problem: Nutritional: Goal: Maintenance of adequate nutrition will improve Outcome: Progressing Goal: Progress toward achieving an optimal weight will improve Outcome: Progressing   Problem: Skin Integrity: Goal: Risk for impaired skin integrity will decrease Outcome: Progressing   Problem: Tissue Perfusion: Goal: Adequacy of tissue perfusion will improve Outcome: Progressing
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
## 2021-10-22 NOTE — Progress Notes (Signed)
PROGRESS NOTE    Gary SITU  OYD:[id number] DOB: 03-Nov-1955  DOA: 09/19/2021 Date of Service: 10/22/21 PCP: [facility] [place] Hospital Course:  66 y/o male with multiple medical problems causing chronic hypercapneic respiratory failure who has had admissions in the past for exacerbations of the same and mechanical ventilation presented for evaluation of dyspnea, weakness. It is unclear if the patient has been compliant with his medications and NIMV at home.  He was admitted by the hospitalists for a presumed COPD exacerbation in the ER, treated with bronchodilators and NIMV but his mental status and hypercarbia worsened despite those interventions so he required intubation.   6/6: Presented to ED.  Required intubation and mechanical ventilation in the ED.  PCCM admit ICU 6/7: Hold diuresis due to worsening Creatinine.  ENT consulted for Trach placement per family request 6/8: Pt continues to spike temps tmax 101.8 degrees F.  Flexiseal placed overnight draining foul smelling watery stool~Cdiff and GI panel pending  6/9: Fever resolved.  Negative for C- diff, GI panel still pending.  Creatinine slightly worsened, requiring low dose levophed (2 mcg). Holding diuresis 6/10: propofol discontinued due hypertriglyceridemia; failed SBT 6/11: Tracheal aspirate from 6/6 resulting with Pseudomonas, ABX changed to Cefepime. neurologically intact, awaiting Trach placement 6/12: Pt calmer today following addition of oral benzo's, narcotics, and Seroquel via tube.  Creatinine worsened with decreased UOP, holding diuresis, low threshold for Nephrology consult  6/13: Pt with worsening renal failure creatinine 4.15.  UOP overnight 25 ml.  Nephrology consulted plans to start pt on hemodialysis follow dialysis catheter placement Pt underwent hemodialysis without ultrafiltration 06/14: .  Plans for HD session today.  Agitation has improved significantly with narcotics and antianxiety medications  per tube   06/16: Tracheostomy canceled 06/15 due to hyperkalemia and tentatively rescheduled for 06/20  6/18 failure to wean from vent 06/20: ENT placed Tracheostomy (#7.0 XLT cuffed).  Developed mild epistaxis post unsuccessful attempt at NGT placement.  Later developed hematochezia/melena, Protonix gtt started and GI consulted. 06/21: Diuresed yesterday with limited results. Patient had softed blood pressures throughout the day and diuresis was held as a result. HD was performed by the patient clotted off the machine and was not able to be rinsed back. Repeat hemoglobin is above 8. 06/23: Pt remains mechanically intubated settings: FiO2 50%/PEEP 10.  CXR concerning for moderate right pleural effusion with associated right basilar atelectasis vs. infiltrate.  Will perform [place] Chest to determine if pt needs right sided thoracentesis  06/24: Remains on the ventilator, FiO2 requirements decreasing, respiratory culture with abundant gram-negative rods: [name], on Meropenem 06/25: Oxygen requirements down to 40%, chest x-ray shows improvement on dense right consolidation, Pseudomonas susceptibilities pending 06/25: Pt remains on the ventilator settings: PEEP 10 and FiO2 40%; will attempt to wean PEEP to 5 to perform SBT  06/28: Pt tolerated PS on 06/27.  Overnight pt had episodes of vomiting TF's suspect secondary to malposition of nasogastric tube.  Will consult IR for PEG tube placement if unable to place PEG tube pt will need postpyloric Dobbhoff   06/29: Pt tolerated PS 5/5 for over an hour yesterday, however due to fatigue placed back on PRVC.  Pt pulled NGT out several times overnight, therefore will not attempt to reinsert.  Plans for PEG tube placement per IR on 06/30.   06/30: PEG tube placed per IR  07/1: Pt tolerated trach collar '@40'$ % throughout the day 07/2: Pt placed on ventilator overnight settings 16/5/35% tolerating well.  Will place pt on trach collar today and place orders  for PT/OT 7/3: Hemodynamically stable.  Tolerating TC (has remained on TC since 7/2 @ 08:00), plan for speech therapy and PT/OT 7/4: Has maintained on trach collar for greater than 48 hours, tolerating Passy-Muir valve.  Plan to transfer out of ICU. SLP recs: dysphagia 1 (puree) w/ nectar liquid.  7/5-7/6: sodium and creatinine trending appropriately, Hgb stable. Low K repleted and stable. Was off vent qhs (orders d/c or missed?) and was restarted. Will need LTAC, TOC following  7/7: pulled out trach overnight. Confusion re: vent plan among staff and myself. I asked PCCM to reevluate him and verify plan so the team and the patient can eb on the same page, and will help TOC arrange appropriate placement.  7/8: I spoke personally w/ Dr Milon Dikes [facility]) and he confirms patient does NOT need ventilator support, unless of course develops distress, but last night the patient tolerated well without it. Plan to meet w/ family today when they visit, I spoke w/ his sister yesterday on the phone. No concerns on CBC/BMP: Cr tending down a bit, Hgb stable, Na slightly high but improving over past few days. He is approaching medically stable and may be appropriate for LTAC vs SNF soon  7/9: Patient is out of stepdown and on progressive unit.  Called by RN in AM re: agitation.  Concern for thrashing and hitting at staff, taking multiple staff members to try to restrain him for medications/vitals.  I came up to assess.  Was able to calm patient down.  He wants to leave the hospital.  He and I discussed that he is certainly within his rights to refuse medical care, but he is not physically able to leave this hospital under his own power.  Reassured him that my priority is to keep him safe, get him as healthy as I can, and also keep the rest of the staff here safe.  Explained to him that some of the medications he is getting are for his medical conditions, but if he is lashing out and is unsafe towards staff, I am going to be  forced to sedate him.  He was upset that he had mitts on his hands, was explained to him that he is trying to pull out trach/tubing in his sleep.  Patient clearly has distrust of the medical team.  RN has been in communication with his sister who will be visiting this afternoon.  Patient and I discussed that once his sister gets here, if she is willing to take him home I am certainly happy to provide a wheelchair for him to leave [place].  I would prefer that he stay another few days while we arrange safe placement for him, make sure he is able to tolerate p.o. and may be able to get the [place] tube out, make sure that he is able to sleep well without needing ventilatory support.  Mental status exam as below, patient is alert and oriented and he has the capacity to understand his options as I have laid them out, and understand the consequences of these options including leaving AMA. Sister will be visiting this afternoon, will reassess then update this progress note.   Consultants:  PCCU Nephrology ENT GI IR  Procedures: 10/03/21: tracheostomy placement 10/12/21: dialysis permacath placement  10/13/21: G-tube placed    Subjective: Patient says he just wants to get out of here, asking about why nothing is being done.  ASSESSMENT & PLAN:   Principal Problem:   Acute on chronic respiratory failure with hypoxia and hypercapnia ([name]) Active Problems:   Acute on chronic diastolic congestive heart failure ([name])   COPD with acute exacerbation ([name])   Acute metabolic encephalopathy   HTN (hypertension)   Obesity, Class III, BMI 40-49.9 (morbid obesity) ([place])   Obstructive sleep apnea   Chronic kidney disease, stage 3a ([name])   HLD (hyperlipidemia)   Pressure injury of skin   No problem-specific Assessment & Plan notes found for this encounter.  Acute on Chronic Hypoxic & Hypercapnic Respiratory Failure in the setting of acute decompensated HFpEF, AECOPD, Pseudomonas  Pneumonia, & OSA/OHS Moderate right sided pleural effusion -ruled out, actually lung CONSOLIDATION -S/p  tracheostomy by ENT by 6/20 -Trach collar w/ ventilator support as needed / qhs but he does NOT need vent supoort overnight routinely per my conversation w/ Dr Milon Dikes 07/08 -VAP bundle implemented  -Bronchodilators & Pulmicort nebs -Completed 10 day course of ceftazidime for treatment  of Pseudomonas -At this point, he is relatively medically stable but placement is going to be an issue for him.     Acute decompensated HFpEF PMHx: Hypertension  Echocardiogram 08/07/2021: LVEF 60 to 24%, grade 1 diastolic dysfunction, normal RV systolic function -Continuous cardiac monitoring -Maintain MAP >65 -Was on intermittent hemodialysis for volume removal - has not needed HD and has remained compensated    AKI superimposed CKD stage IIIa  Hypokalemia  Hypernatremia During admissions in February 2023, creatinine 1.8 with GFR 40; in May 2023 creatinine 1.1 with GFR of 60 -Monitor I&O's / urinary output -Follow BMP  -Cr down, good UOP noted, d/c dialysis catheter at this time, monitor renal parameters daily.  -Increased free water flushes 7/4 -No further dialysis needed per nephro, see note 10/18/21  -Unable to get a.m. labs today 10/22/2021.   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~RESOLVED Pseudomonas Pneumonia ~persistent, initial ABX course short - on meropenem ~ TREATED -Monitor fever curve -Trend WBC's -Completed 10 day course of ceftazidime (started on 06/26) -C. Diff negative   Acute Blood Loss Anemia~RESOLVED Nasopharyngeal trauma vs Upper GI bleed -Trend CBC  -VTE px: subq heparin and SCD's  -Monitor for s/sx of bleeding and transfuse for Hgb <7 -Continue BID IV PPI -Consulted GI, no need for scope  -nephrology recommends consider epogen as outpatient    Hyperglycemia ~STABLE -CBG's q4h; Target range of 140 to 180 -SSI -Follow ICU Hypo/Hyperglycemia protocol  Acute  Metabolic Encephalopathy in setting of CO2 Narcosis Sedation needs in setting of mechanical ventilation~RESOLVED  CT Head 09/19/21: negative;  UDS negative -Treatment of hypercapnia as outlined above -Maintain a RASS goal of 0  -Continue oxycodone and klonopin prn due to agitation/delirium but limit these if possible  Trach in place Gtube in place Was Requiring ventilatory support at night, has done well without this Working on increasing po intake, SLP following w/ some concern for mild aspiration CXR in AM 07/07 no signs aspiration Has not required ventilatory support, doing well on O2 via the trach   DVT prophylaxis: SCD and subcutaneous heparin Code Status: FULL Family Communication:  plan for family meeting today  Disposition Plan / TOC needs: out of SDU and onto progressive status 10/18/21, PT/OT following, may be able to transfer to medsurg but need to know if they'll take patietn w/ trach/vent  Barriers to discharge / significant pending items: remains on tube feeds, diet advancing, remains on trach - may need LTAC,  Objective: Vitals:   10/21/21 2039 10/22/21 0058 10/22/21 0400 10/22/21 0500  BP:  115/72 126/86   Pulse: 86 82 86   Resp: (!) 27 (!) 24 (!) 22   Temp:  98.3 F (36.8 C) 98 F (36.7 C)   TempSrc:  Oral Oral   SpO2: 99% 98% 100%   Weight:    (!) 143 kg  Height:        Intake/Output Summary (Last 24 hours) at 10/22/2021 0109 Last data filed at 10/22/2021 0600 Gross per 24 hour  Intake 2095.33 ml  Output 1750 ml  Net 345.33 ml    Filed Weights   10/18/21 0330 10/21/21 0400 10/22/21 0500  Weight: (!) 169 kg (!) 193 kg (!) 143 kg    Examination:  Constitutional:  VS as above General Appearance: alert, well-developed, well-nourished, NAD Eyes: Normal lids and conjunctive, non-icteric sclera Ears, Nose, Mouth, Throat: Normal appearance Neck: No masses, trachea midline Trach in place, appears clean and ventilating well  No  thyroid enlargement/tenderness/mass appreciated Respiratory: Normal respiratory effort Musculoskeletal:  No clubbing/cyanosis of digits Neurological: No cranial nerve deficit on limited exa Psychiatric: Normal judgment/insight Agitated, but able to be calm down. Has capacity to make informed medical decisions.       Scheduled Medications:   atorvastatin  10 mg Per Tube Daily   budesonide (PULMICORT) nebulizer solution  0.5 mg Nebulization BID   Chlorhexidine Gluconate Cloth  6 each Topical Daily   cholecalciferol  2,000 Units Per Tube Daily   epoetin (EPOGEN/PROCRIT) injection  20,000 Units Subcutaneous Weekly   feeding supplement (PROSource TF)  45 mL Per Tube QID   free water  200 mL Per Tube Q4H   heparin injection (subcutaneous)  5,000 Units Subcutaneous Q8H   heparin sodium (porcine)  2,000 Units Intravenous Q M,W,F-HD   ipratropium-albuterol  3 mL Nebulization BID   multivitamin  1 tablet Per Tube QHS   nutrition supplement (JUVEN)  1 packet Per Tube BID BM   mouth rinse  15 mL Mouth Rinse 4 times per day   pantoprazole sodium  40 mg Per Tube BID   traZODone  50 mg Oral QHS    Continuous Infusions:  sodium chloride Stopped (10/07/21 0542)   feeding supplement (NEPRO CARB STEADY) 1,000 mL (10/21/21 0958)    PRN Medications:  acetaminophen, acetaminophen, albuterol, alteplase, bisacodyl, clonazepam, heparin, HYDROmorphone (DILAUDID) injection, loperamide, mouth rinse, oxyCODONE, pentafluoroprop-tetrafluoroeth, polyethylene glycol, QUEtiapine  Antimicrobials:  Anti-infectives (From admission, onward)    Start     Dose/Rate Route Frequency Ordered Stop   10/12/21 1200  cefTAZidime (FORTAZ) 2 g in sodium chloride 0.9 % 100 mL IVPB        2 g 200 mL/hr over 30 Minutes Intravenous 2 times daily 10/12/21 1122 10/17/21 0844   10/12/21 0417  ceFAZolin (ANCEF) IVPB 1 g/50 mL premix  Status:  Discontinued        1 g 100 mL/hr over 30 Minutes Intravenous 30 min pre-op  10/12/21 0417 10/16/21 0751   10/09/21 2000  ceFEPIme (MAXIPIME) 1 g in sodium chloride 0.9 % 100 mL IVPB  Status:  Discontinued        1 g 200 mL/hr over 30 Minutes Intravenous Every 24 hours 10/09/21 1404 10/09/21 1515   10/09/21 2000  cefTAZidime (FORTAZ) 1 g in sodium chloride 0.9 % 100 mL IVPB  Status:  Discontinued        1 g 200 mL/hr over 30 Minutes Intravenous Every 24 hours 10/09/21 1515 10/12/21  1122   10/06/21 1830  meropenem (MERREM) 500 mg in sodium chloride 0.9 % 100 mL IVPB  Status:  Discontinued        500 mg 200 mL/hr over 30 Minutes Intravenous Every 24 hours 10/06/21 1735 10/09/21 1403   10/06/21 1800  doxycycline (VIBRAMYCIN) 100 mg in sodium chloride 0.9 % 250 mL IVPB  Status:  Discontinued        100 mg 125 mL/hr over 120 Minutes Intravenous Every 12 hours 10/06/21 1657 10/08/21 0838   09/29/21 1000  ceFEPIme (MAXIPIME) 2 g in sodium chloride 0.9 % 100 mL IVPB        2 g 200 mL/hr over 30 Minutes Intravenous Every 12 hours 09/29/21 0812 09/30/21 2342   09/26/21 2200  ceFEPIme (MAXIPIME) 1 g in sodium chloride 0.9 % 100 mL IVPB  Status:  Discontinued        1 g 200 mL/hr over 30 Minutes Intravenous Every 24 hours 09/26/21 1154 09/29/21 0812   09/26/21 2000  ceFEPIme (MAXIPIME) 2 g in sodium chloride 0.9 % 100 mL IVPB  Status:  Discontinued        2 g 200 mL/hr over 30 Minutes Intravenous Every 12 hours 09/26/21 0809 09/26/21 1154   09/24/21 1200  ceFEPIme (MAXIPIME) 2 g in sodium chloride 0.9 % 100 mL IVPB  Status:  Discontinued        2 g 200 mL/hr over 30 Minutes Intravenous Every 8 hours 09/24/21 0950 09/26/21 0809   09/21/21 1430  azithromycin (ZITHROMAX) 500 mg in sodium chloride 0.9 % 250 mL IVPB        500 mg 250 mL/hr over 60 Minutes Intravenous Every 24 hours 09/21/21 1344 09/23/21 1543   09/21/21 1430  cefTRIAXone (ROCEPHIN) 2 g in sodium chloride 0.9 % 100 mL IVPB  Status:  Discontinued        2 g 200 mL/hr over 30 Minutes Intravenous Every 24 hours  09/21/21 1344 09/24/21 0922       Data Reviewed: I have personally reviewed following labs and imaging studies  CBC: Recent Labs  Lab 10/17/21 0418 10/18/21 0456 10/19/21 0328 10/20/21 0426 10/21/21 0736  WBC 6.5 6.3 6.2 6.3 6.6  NEUTROABS 3.8 3.1 3.1 4.5 4.0  HGB 8.6* 8.8* 8.5* 9.1* 8.6*  HCT 31.2* 31.6* 30.9* 33.2* 32.0*  MCV 81.7 81.9 82.2 82.6 84.4  PLT 213 262 269 232 810    Basic Metabolic Panel: Recent Labs  Lab 10/17/21 0418 10/18/21 0456 10/19/21 0328 10/20/21 0426 10/21/21 0736  NA 151* 149* 147* 146*  148* 146*  K 3.4* 3.0* 3.8 3.2*  3.3* 3.8  CL 120* 116* 113* 113*  115* 111  CO2 '28 29 29 28  30 30  '$ GLUCOSE 113* 100* 95 92  93 112*  BUN 82* 69* 69* 65*  67* 53*  CREATININE 1.93* 1.69* 1.59* 1.38*  1.40* 1.40*  CALCIUM 8.5* 8.5* 8.3* 8.6*  8.6* 8.6*  MG 2.0 1.9 1.7 1.8 2.1  PHOS 2.4* 2.0* 2.4* 2.0* 4.7*    GFR: Estimated Creatinine Clearance: 81.4 mL/min (A) (by C-G formula based on SCr of 1.4 mg/dL (H)). Liver Function Tests: Recent Labs  Lab 10/17/21 0418 10/18/21 0456 10/19/21 0328 10/20/21 0426 10/21/21 0736  ALBUMIN 2.3* 2.3* 2.4* 2.5* 2.4*    No results for input(s): "LIPASE", "AMYLASE" in the last 168 hours. No results for input(s): "AMMONIA" in the last 168 hours. Coagulation Profile: No results for input(s): "INR", "PROTIME" in the last 168 hours.  Cardiac Enzymes: No results for input(s): "CKTOTAL", "CKMB", "CKMBINDEX", "TROPONINI" in the last 168 hours. BNP (last 3 results) No results for input(s): "PROBNP" in the last 8760 hours. HbA1C: No results for input(s): "HGBA1C" in the last 72 hours. CBG: Recent Labs  Lab 10/21/21 0401 10/21/21 0734 10/21/21 1149 10/21/21 1621 10/21/21 2115  GLUCAP 116* 111* 142* 109* 113*    Lipid Profile: No results for input(s): "CHOL", "HDL", "LDLCALC", "TRIG", "CHOLHDL", "LDLDIRECT" in the last 72 hours. Thyroid Function Tests: No results for input(s): "TSH", "T4TOTAL",  "FREET4", "T3FREE", "THYROIDAB" in the last 72 hours. Anemia Panel: No results for input(s): "VITAMINB12", "FOLATE", "FERRITIN", "TIBC", "IRON", "RETICCTPCT" in the last 72 hours. Urine analysis:    Component Value Date/Time   COLORURINE STRAW (A) 09/19/2021 1247   APPEARANCEUR CLEAR (A) 09/19/2021 1247   APPEARANCEUR Clear 04/15/2014 1240   LABSPEC 1.009 09/19/2021 1247   LABSPEC 1.013 04/15/2014 1240   PHURINE 5.0 09/19/2021 1247   GLUCOSEU 150 (A) 09/19/2021 1247   GLUCOSEU Negative 04/15/2014 1240   HGBUR NEGATIVE 09/19/2021 1247   BILIRUBINUR NEGATIVE 09/19/2021 1247   BILIRUBINUR Negative 04/15/2014 1240   KETONESUR NEGATIVE 09/19/2021 1247   PROTEINUR NEGATIVE 09/19/2021 1247   NITRITE NEGATIVE 09/19/2021 1247   LEUKOCYTESUR NEGATIVE 09/19/2021 1247   LEUKOCYTESUR Negative 04/15/2014 1240   Sepsis Labs: '@LABRCNTIP'$ (procalcitonin:4,lacticidven:4)  Recent Results (from the past 240 hour(s))  Gastrointestinal Panel by PCR , Stool     Status: None   Collection Time: 10/21/21  2:13 AM   Specimen: Stool  Result Value Ref Range Status   Campylobacter species NOT DETECTED NOT DETECTED Final   Plesimonas shigelloides NOT DETECTED NOT DETECTED Final   Salmonella species NOT DETECTED NOT DETECTED Final   Yersinia enterocolitica NOT DETECTED NOT DETECTED Final   Vibrio species NOT DETECTED NOT DETECTED Final   Vibrio cholerae NOT DETECTED NOT DETECTED Final   Enteroaggregative E coli (EAEC) NOT DETECTED NOT DETECTED Final   Enteropathogenic E coli (EPEC) NOT DETECTED NOT DETECTED Final   Enterotoxigenic E coli (ETEC) NOT DETECTED NOT DETECTED Final   Shiga like toxin producing E coli (STEC) NOT DETECTED NOT DETECTED Final   Shigella/Enteroinvasive E coli (EIEC) NOT DETECTED NOT DETECTED Final   Cryptosporidium NOT DETECTED NOT DETECTED Final   Cyclospora cayetanensis NOT DETECTED NOT DETECTED Final   Entamoeba histolytica NOT DETECTED NOT DETECTED Final   Giardia lamblia NOT  DETECTED NOT DETECTED Final   Adenovirus F40/41 NOT DETECTED NOT DETECTED Final   Astrovirus NOT DETECTED NOT DETECTED Final   Norovirus GI/GII NOT DETECTED NOT DETECTED Final   Rotavirus A NOT DETECTED NOT DETECTED Final   Sapovirus (I, II, IV, and V) NOT DETECTED NOT DETECTED Final    Comment: Performed at [facility], [address] [place], [place] [address]         Radiology Studies last 96 hours: DG Chest Port 1 View  Result Date: 10/20/2021 CLINICAL DATA:  Pulmonary edema EXAM: PORTABLE CHEST 1 VIEW COMPARISON:  10/13/2021 FINDINGS: Tracheostomy tube in place. Cardiomegaly and vascular pedicle widening with vascular congestion. Some atelectasis is likely superimposed. Remote left rib fractures. No visible effusion or pneumothorax. Lung volumes are low. IMPRESSION: Cardiomegaly and vascular congestion similar to prior. Electronically Signed   By: Jorje Guild M.D.   On: 10/20/2021 05:44            LOS: 33 days      Emeterio Reeve, DO [facility] 10/22/2021, 8:28 AM   Staff may message  me via secure chat in [place]  but this may not receive immediate response,  please page for urgent matters!  If 7PM-7AM, please contact night-coverage [email or web address]  Dictation software was used to generate the above note. Typos may occur and escape review, as with typed/written notes. Please contact Dr Sheppard Coil directly for clarity if needed.

## 2021-10-22 NOTE — Progress Notes (Signed)
SLP Cancellation Note  Patient Details Name: Gary [name] MRN: [id number] DOB: 25-Dec-1955   Cancelled treatment:       Reason Eval/Treat Not Completed: Medical issues which prohibited therapy   Per MD and RN, pt with significant agitation and aggression this morning. MD requesting SLP to hold diet tolerance at this time. Per RN, pt appears to be tolerating current diet without overt s/sx pharyngeal dysphagia. Will continue efforts as appropriate.  Cherrie Gauze, M.S., [facility] [phone] Gary [name])   Gary [name] 10/22/2021, 8:42 AM

## 2021-10-23 ENCOUNTER — Encounter: Payer: Self-pay | Admitting: Anesthesiology

## 2021-10-23 DIAGNOSIS — Z7189 Other specified counseling: Secondary | ICD-10-CM | POA: Diagnosis not present

## 2021-10-23 LAB — CBC WITH DIFFERENTIAL/PLATELET
Abs Immature Granulocytes: 0.11 10*3/uL — ABNORMAL HIGH (ref 0.00–0.07)
Basophils Absolute: 0.1 10*3/uL (ref 0.0–0.1)
Basophils Relative: 1 %
Eosinophils Absolute: 0.1 10*3/uL (ref 0.0–0.5)
Eosinophils Relative: 2 %
HCT: 31.9 % — ABNORMAL LOW (ref 39.0–52.0)
Hemoglobin: 8.6 g/dL — ABNORMAL LOW (ref 13.0–17.0)
Immature Granulocytes: 1 %
Lymphocytes Relative: 22 %
Lymphs Abs: 1.7 10*3/uL (ref 0.7–4.0)
MCH: 22.2 pg — ABNORMAL LOW (ref 26.0–34.0)
MCHC: 27 g/dL — ABNORMAL LOW (ref 30.0–36.0)
MCV: 82.2 fL (ref 80.0–100.0)
Monocytes Absolute: 1 10*3/uL (ref 0.1–1.0)
Monocytes Relative: 13 %
Neutro Abs: 4.7 10*3/uL (ref 1.7–7.7)
Neutrophils Relative %: 61 %
Platelets: 192 10*3/uL (ref 150–400)
RBC: 3.88 MIL/uL — ABNORMAL LOW (ref 4.22–5.81)
RDW: 17.3 % — ABNORMAL HIGH (ref 11.5–15.5)
WBC: 7.6 10*3/uL (ref 4.0–10.5)
nRBC: 1 % — ABNORMAL HIGH (ref 0.0–0.2)

## 2021-10-23 LAB — GLUCOSE, CAPILLARY
Glucose-Capillary: 108 mg/dL — ABNORMAL HIGH (ref 70–99)
Glucose-Capillary: 110 mg/dL — ABNORMAL HIGH (ref 70–99)
Glucose-Capillary: 100 mg/dL — ABNORMAL HIGH (ref 70–99)
Glucose-Capillary: 102 mg/dL — ABNORMAL HIGH (ref 70–99)

## 2021-10-23 LAB — RENAL FUNCTION PANEL
Albumin: 2.4 g/dL — ABNORMAL LOW (ref 3.5–5.0)
Anion gap: 3 — ABNORMAL LOW (ref 5–15)
BUN: 32 mg/dL — ABNORMAL HIGH (ref 8–23)
CO2: 31 mmol/L (ref 22–32)
Calcium: 8.6 mg/dL — ABNORMAL LOW (ref 8.9–10.3)
Chloride: 110 mmol/L (ref 98–111)
Creatinine, Ser: 1.15 mg/dL (ref 0.61–1.24)
GFR, Estimated: >60 mL/min (ref >60)
Glucose, Bld: 107 mg/dL — ABNORMAL HIGH (ref 70–99)
Phosphorus: 2.4 mg/dL — ABNORMAL LOW (ref 2.5–4.6)
Potassium: 3.6 mmol/L (ref 3.5–5.1)
Sodium: 144 mmol/L (ref 135–145)

## 2021-10-23 LAB — MAGNESIUM: Magnesium: 1.6 mg/dL — ABNORMAL LOW (ref 1.7–2.4)

## 2021-10-23 MED ORDER — ENOXAPARIN SODIUM 80 MG/0.8ML IJ SOSY
0.5000 mg/kg | PREFILLED_SYRINGE | INTRAMUSCULAR | Status: DC
  Administered 2021-10-23 – 2021-10-27 (×5): 72.5 mg via SUBCUTANEOUS
  Filled 2021-10-23 (×3): qty 0.8
  Filled 2021-10-23: qty 0.72
  Filled 2021-10-23: qty 0.8

## 2021-10-23 NOTE — Progress Notes (Signed)
SLP Cancellation Note  Patient Details Name: Gary [name] MRN: [id number] DOB: 20-May-1955   Cancelled treatment:       Reason Eval/Treat Not Completed: Fatigue/lethargy limiting ability to participate and safely consume breakfast for diet tolerance at this time. RN made aware.   Will continue efforts as appropriate.   Cherrie Gauze, M.S., [facility] [phone] ([place])  Quintella Baton 10/23/2021, 9:12 AM

## 2021-10-23 NOTE — Plan of Care (Signed)
  Problem: Education: Goal: Ability to demonstrate management of disease process will improve Outcome: Progressing Goal: Ability to verbalize understanding of medication therapies will improve Outcome: Progressing   Problem: Activity: Goal: Capacity to carry out activities will improve Outcome: Progressing   Problem: Cardiac: Goal: Ability to achieve and maintain adequate cardiopulmonary perfusion will improve Outcome: Progressing   Problem: Education: Goal: Knowledge of disease or condition will improve Outcome: Progressing Goal: Knowledge of the prescribed therapeutic regimen will improve Outcome: Progressing   Problem: Activity: Goal: Ability to tolerate increased activity will improve Outcome: Progressing Goal: Will verbalize the importance of balancing activity with adequate rest periods Outcome: Progressing   Problem: Respiratory: Goal: Ability to maintain a clear airway will improve Outcome: Progressing Goal: Levels of oxygenation will improve Outcome: Progressing Goal: Ability to maintain adequate ventilation will improve Outcome: Progressing   Problem: Education: Goal: Knowledge of General Education information will improve Description: Including pain rating scale, medication(s)/side effects and non-pharmacologic comfort measures Outcome: Progressing   Problem: Health Behavior/Discharge Planning: Goal: Ability to manage health-related needs will improve Outcome: Progressing   Problem: Clinical Measurements: Goal: Ability to maintain clinical measurements within normal limits will improve Outcome: Progressing Goal: Will remain free from infection Outcome: Progressing Goal: Diagnostic test results will improve Outcome: Progressing Goal: Respiratory complications will improve Outcome: Progressing Goal: Cardiovascular complication will be avoided Outcome: Progressing   Problem: Activity: Goal: Risk for activity intolerance will decrease Outcome:  Progressing   Problem: Nutrition: Goal: Adequate nutrition will be maintained Outcome: Progressing   Problem: Coping: Goal: Level of anxiety will decrease Outcome: Progressing   Problem: Elimination: Goal: Will not experience complications related to bowel motility Outcome: Progressing Goal: Will not experience complications related to urinary retention Outcome: Progressing   Problem: Pain Managment: Goal: General experience of comfort will improve Outcome: Progressing   Problem: Safety: Goal: Ability to remain free from injury will improve Outcome: Progressing   Problem: Skin Integrity: Goal: Risk for impaired skin integrity will decrease Outcome: Progressing   Problem: Education: Goal: Ability to describe self-care measures that may prevent or decrease complications (Diabetes Survival Skills Education) will improve Outcome: Progressing   Problem: Coping: Goal: Ability to adjust to condition or change in health will improve Outcome: Progressing   Problem: Fluid Volume: Goal: Ability to maintain a balanced intake and output will improve Outcome: Progressing   Problem: Health Behavior/Discharge Planning: Goal: Ability to identify and utilize available resources and services will improve Outcome: Progressing Goal: Ability to manage health-related needs will improve Outcome: Progressing   Problem: Metabolic: Goal: Ability to maintain appropriate glucose levels will improve Outcome: Progressing   Problem: Nutritional: Goal: Maintenance of adequate nutrition will improve Outcome: Progressing Goal: Progress toward achieving an optimal weight will improve Outcome: Progressing   Problem: Skin Integrity: Goal: Risk for impaired skin integrity will decrease Outcome: Progressing   Problem: Tissue Perfusion: Goal: Adequacy of tissue perfusion will improve Outcome: Progressing

## 2021-10-23 NOTE — Plan of Care (Signed)
  Problem: Safety: Goal: Ability to remain free from injury will improve Outcome: Progressing   Problem: Skin Integrity: Goal: Risk for impaired skin integrity will decrease Outcome: Progressing   Problem: Coping: Goal: Level of anxiety will decrease Outcome: Progressing

## 2021-10-23 NOTE — Progress Notes (Signed)
Patient refuses to take passy muir valve off. Patient educated on the importance of not having it on all the time. Patient still refuses to take valve off. Will continue to monitor.

## 2021-10-23 NOTE — TOC Progression Note (Signed)
Transition of Care [facility]) - Progression Note    Patient Details  Name: ILIAN WESSELL MRN: [id number] Date of Birth: May 06, 1955  Transition of Care [facility]) CM/SW [place], [place] Phone Number: 10/23/2021, 9:53 AM  Clinical Narrative:     Patient has no bed offers for SNF , barriers include insurance and behaviors. Patient is no longer on vent just trach.   CSW did follow up with [facility] they report they dont accept insurance at their facility, and that they have reached out to their [facility] location who is also not willing to offer a bed.    Expected Discharge Plan: Long Term Acute Care (LTAC) Barriers to Discharge: Continued Medical Work up  Expected Discharge Plan and Services Expected Discharge Plan: Long Term Acute Care (LTAC)   Discharge Planning Services: CM Consult Post Acute Care Choice: Long Term Acute Care (LTAC) Living arrangements for the past 2 months: Single Family Home                 DME Arranged: N/A DME Agency: NA       HH Arranged: NA HH Agency: NA         Social Determinants of Health (SDOH) Interventions    Readmission Risk Interventions    05/04/2021    1:26 PM 04/07/2021    1:39 PM 03/12/2021   11:54 AM  Readmission Risk Prevention Plan  Transportation Screening Complete Complete Complete  PCP or Specialist Appt within 3-5 Days  Complete Complete  HRI or [place]   Complete  Social Work Consult for [place] Planning/Counseling   Complete  Palliative Care Screening  Not Applicable Not Applicable  Medication Review Press photographer) Complete Complete Complete  PCP or Specialist appointment within 3-5 days of discharge Complete    HRI or [place] Complete    SW Recovery Care/Counseling Consult Complete    [place] Not Applicable

## 2021-10-23 NOTE — Progress Notes (Signed)
Speech Language Pathology Treatment:    Patient Details Name: Gary [name] MRN: [id number] DOB: 1955-08-09 Today's Date: 10/23/2021 Time: 1700-1749 SLP Time Calculation (min) (ACUTE ONLY): 8 min  Assessment / Plan / Recommendation Clinical Impression  Upon SLP entrance to room, PT, PT student, and rehab tech present. Pt on trach collar (28% FiO2; 5L/min) with PMV in place. Clear, strong voice appreciated. Cleared with RN.   Per chart review, temp and WBC WNL. No recent chest imaging. Pt denies s/sx dysphagia with mech soft diet. RN agreed.   Pt needed assistance with repositioning and encouragement for semi-upright positioning for PO intake. Pt given trials of solids and thin liquids (via straw; single and consecutive sips). Pt with mildly prolonged mastication of solid likely due to deconditioned state. No overt or subtle s/sx pharyngeal dysphagia across trials.   Recommend continuation of current diet with safe swallowing strategies/aspiration precautions as outlined below including, but not limited to, slow rate of intake, small single bites/sips, alternation of bites/sips, and PMV placement for PO intake.  SLP to continue to monitor for further needs while pt admitted.   Pt and RN made aware of the above. Pt nodding in agreement.   Pt left in care of PT and PT student.    HPI HPI: Per H&P "66 y/o male with multiple medical problems causing chronic hypercapneic respiratory failure who has had admissions in the past for exacerbations of the same and mechanical ventilation presented for evaluation of dyspnea, weakness. It is unclear if the patient has been compliant with his medications and NIMV at home.  He was admitted by the hospitalists for a presumed COPD exacerbation in the ER, treated with bronchodilators and NIMV but his mental status and hypercarbia worsened despite those interventions so he required intubation.  PCCM consulted for admission.  He could not provide history for [place]  because he was encephalopathic.".   Pt intubated 09/19/21, tracheostomy placed 10/03/21, and PEG placed 10/13/21.    PMH includes: diastolic CHF (EF 60 to 44%, grade III DD, 02/2021), COPD on home O2 2 L, HTN, OSA on CPAP, PVD, hyperlipidemia, former smoker, Morbid Obesity with a BMI 49.65, GI bleeding, CKD-3, iron deficiency anemia, who presents with shortness of breath.   Patient has had Multiple hospitalizations in recent months.      SLP Plan  Continue with current plan of care      Recommendations for follow up therapy are one component of a multi-disciplinary discharge planning process, led by the attending physician.  Recommendations may be updated based on patient status, additional functional criteria and insurance authorization.    Recommendations  Diet recommendations: Dysphagia 3 (mechanical soft);Thin liquid Liquids provided via: Cup;Straw Medication Administration: Other (Comment) Supervision: Staff to assist with self feeding;Full supervision/cueing for compensatory strategies Compensations: Minimize environmental distractions;Slow rate;Small sips/bites;Lingual sweep for clearance of pocketing;Follow solids with liquid Postural Changes and/or Swallow Maneuvers: Out of bed for meals;Seated upright 90 degrees;Upright 30-60 min after meal      Patient may use Passy-Muir Speech Valve: During all therapies with supervision;During PO intake/meals;During all waking hours (remove during sleep) PMSV Supervision: Intermittent MD: Please consider changing trach tube to :  (TBD)         Oral Care Recommendations: Oral care BID;Oral care before and after PO;Staff/trained caregiver to provide oral care Follow Up Recommendations: Skilled nursing-short term rehab (<3 hours/day) Assistance recommended at discharge: Frequent or constant Supervision/Assistance SLP Visit Diagnosis: Dysphagia, unspecified (R13.10);[place] (R49.1) Plan: Continue with current plan of care  Cherrie Gauze, M.S., [facility] [phone] [place])  Quintella Baton  10/23/2021, 11:49 AM

## 2021-10-23 NOTE — Progress Notes (Signed)
PROGRESS NOTE    Gary [name]  KCM:[id number] DOB: [birth year]  DOA: 09/19/2021 Date of Service: 10/23/21 PCP: [facility], [facility] Hospital Course:  66 y/o male with multiple medical problems causing chronic hypercapneic respiratory failure who has had admissions in the past for exacerbations of the same and mechanical ventilation presented for evaluation of dyspnea, weakness. It is unclear if the patient has been compliant with his medications and NIMV at home.  He was admitted by the hospitalists for a presumed COPD exacerbation in the ER, treated with bronchodilators and NIMV but his mental status and hypercarbia worsened despite those interventions so he required intubation.   6/6: Presented to ED.  Required intubation and mechanical ventilation in the ED.  PCCM admit ICU 6/7: Hold diuresis due to worsening Creatinine.  ENT consulted for Trach placement per family request 6/8: Pt continues to spike temps tmax 101.8 degrees F.  Flexiseal placed overnight draining foul smelling watery stool~Cdiff and GI panel pending  6/9: Fever resolved.  Negative for C- diff, GI panel still pending.  Creatinine slightly worsened, requiring low dose levophed (2 mcg). Holding diuresis 6/10: propofol discontinued due hypertriglyceridemia; failed SBT 6/11: Tracheal aspirate from 6/6 resulting with Pseudomonas, ABX changed to Cefepime. neurologically intact, awaiting Trach placement 6/12: Pt calmer today following addition of oral benzo's, narcotics, and Seroquel via tube.  Creatinine worsened with decreased UOP, holding diuresis, low threshold for Nephrology consult  6/13: Pt with worsening renal failure creatinine 4.15.  UOP overnight 25 ml.  Nephrology consulted plans to start pt on hemodialysis follow dialysis catheter placement Pt underwent hemodialysis without ultrafiltration 06/14: .  Plans for HD session today.  Agitation has improved significantly with narcotics and antianxiety medications  per tube   06/16: Tracheostomy canceled 06/15 due to hyperkalemia and tentatively rescheduled for 06/20  6/18 failure to wean from vent 06/20: ENT placed Tracheostomy (#7.0 XLT cuffed).  Developed mild epistaxis post unsuccessful attempt at NGT placement.  Later developed hematochezia/melena, Protonix gtt started and GI consulted. 06/21: Diuresed yesterday with limited results. Patient had softed blood pressures throughout the day and diuresis was held as a result. HD was performed by the patient clotted off the machine and was not able to be rinsed back. Repeat hemoglobin is above 8. 06/23: Pt remains mechanically intubated settings: FiO2 50%/PEEP 10.  CXR concerning for moderate right pleural effusion with associated right basilar atelectasis vs. infiltrate.  Will perform [place] Chest to determine if pt needs right sided thoracentesis  06/24: Remains on the ventilator, FiO2 requirements decreasing, respiratory culture with abundant gram-negative rods: [name], on Meropenem 06/25: Oxygen requirements down to 40%, chest x-ray shows improvement on dense right consolidation, Pseudomonas susceptibilities pending 06/25: Pt remains on the ventilator settings: PEEP 10 and FiO2 40%; will attempt to wean PEEP to 5 to perform SBT  06/28: Pt tolerated PS on 06/27.  Overnight pt had episodes of vomiting TF's suspect secondary to malposition of nasogastric tube.  Will consult IR for PEG tube placement if unable to place PEG tube pt will need postpyloric Dobbhoff   06/29: Pt tolerated PS 5/5 for over an hour yesterday, however due to fatigue placed back on PRVC.  Pt pulled NGT out several times overnight, therefore will not attempt to reinsert.  Plans for PEG tube placement per IR on 06/30.   06/30: PEG tube placed per IR  07/1: Pt tolerated trach collar '@40'$ % throughout the day 07/2: Pt placed on ventilator overnight settings 16/5/35% tolerating well.  Will place pt on trach collar today and place orders  for PT/OT 7/3: Hemodynamically stable.  Tolerating TC (has remained on TC since 7/2 @ 08:00), plan for speech therapy and PT/OT 7/4: Has maintained on trach collar for greater than 48 hours, tolerating Passy-Muir valve.  Plan to transfer out of ICU. SLP recs: dysphagia 1 (puree) w/ nectar liquid.  7/5-7/6: sodium and creatinine trending appropriately, Hgb stable. Low K repleted and stable. Was off vent qhs (orders d/c or missed?) and was restarted. Will need LTAC, TOC following  7/7: pulled out trach overnight. Confusion re: vent plan among staff and myself. I asked PCCM to reevluate him and verify plan so the team and the patient can eb on the same page, and will help TOC arrange appropriate placement.  7/8: I spoke personally w/ Dr Milon Dikes [facility]) and he confirms patient does NOT need ventilator support, unless of course develops distress, but last night the patient tolerated well without it. Plan to meet w/ family today when they visit, I spoke w/ his sister yesterday on the phone. No concerns on CBC/BMP: Cr tending down a bit, Hgb stable, Na slightly high but improving over past few days. He is approaching medically stable and may be appropriate for LTAC vs SNF soon  7/9: Several long discussions with patient and family about medical condition, behavioral issues.  See separate progress notes.  Psychiatry evaluated, agree that he has capacity, started SSRI. 7/10: Patient remains medically stable, awaiting placement.  Discussed his behavioral issues again and encouraged him to always bring any concerns to nursing staff or to me calmly and I am happy to listen, discussed professionalism towards staff, he seems amenable.  Consultants:  PCCU Nephrology ENT GI IR Psychiatry  Procedures: 10/03/21: tracheostomy placement 10/12/21: dialysis permacath placement  10/13/21: G-tube placed    Subjective: Patient says he just wants to get out of here, asking about why nothing is being done.        ASSESSMENT & PLAN:   Principal Problem:   Acute on chronic respiratory failure with hypoxia and hypercapnia ([name]) Active Problems:   Major depressive disorder, recurrent episode, moderate ([name])   Acute on chronic diastolic congestive heart failure ([name])   COPD with acute exacerbation ([name])   Acute metabolic encephalopathy   HTN (hypertension)   Obesity, Class III, BMI 40-49.9 (morbid obesity) ([place])   Obstructive sleep apnea   Chronic kidney disease, stage 3a ([name])   HLD (hyperlipidemia)   Pressure injury of skin   No problem-specific Assessment & Plan notes found for this encounter.  Acute on Chronic Hypoxic & Hypercapnic Respiratory Failure in the setting of acute decompensated HFpEF, AECOPD, Pseudomonas Pneumonia, & OSA/OHS Moderate right sided pleural effusion -ruled out, actually lung CONSOLIDATION -S/p  tracheostomy by ENT by 6/20 -Trach collar w/ ventilator support as needed / qhs but he does NOT need vent supoort overnight routinely per my conversation w/ Dr Milon Dikes 07/08 -VAP bundle implemented  -Bronchodilators & Pulmicort nebs -Completed 10 day course of ceftazidime for treatment  of Pseudomonas -At this point, he is relatively medically stable but placement is going to be an issue for him.  I have discussed that his behavioral issues are impeding placement.   Acute decompensated HFpEF PMHx: Hypertension  Echocardiogram 08/07/2021: LVEF 60 to 78%, grade 1 diastolic dysfunction, normal RV systolic function -Continuous cardiac monitoring -Maintain MAP >65 -Was on intermittent hemodialysis for volume removal - has not needed HD and has remained compensated    AKI superimposed CKD  stage IIIa  Hypokalemia  Hypernatremia During admissions in February 2023, creatinine 1.8 with GFR 40; in May 2023 creatinine 1.1 with GFR of 60 -Monitor I&O's / urinary output -Follow BMP  -Cr down, good UOP noted, d/c dialysis catheter at this time, monitor renal parameters daily.   -Increased free water flushes 7/4 -No further dialysis needed per nephro, see note 10/18/21    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~RESOLVED Pseudomonas Pneumonia ~persistent, initial ABX course short - on meropenem ~ TREATED -Monitor fever curve -Trend WBC's -Completed 10 day course of ceftazidime (started on 06/26) -C. Diff negative   Acute Blood Loss Anemia~RESOLVED Nasopharyngeal trauma vs Upper GI bleed -Trend CBC  -VTE px: subq heparin and SCD's  -Monitor for s/sx of bleeding and transfuse for Hgb <7 -Continue BID IV PPI -Consulted GI, no need for scope  -nephrology recommends consider epogen as outpatient    Hyperglycemia ~STABLE -CBG's q4h; Target range of 140 to 180 -SSI -Follow ICU Hypo/Hyperglycemia protocol  Acute Metabolic Encephalopathy in setting of CO2 Narcosis Sedation needs in setting of mechanical ventilation~RESOLVED  CT Head 09/19/21: negative;  UDS negative -Treatment of hypercapnia as outlined above -Maintain a RASS goal of 0  -Continue oxycodone and klonopin prn due to agitation/delirium but limit these if possible  Trach in place Gtube in place Was Requiring ventilatory support at night, has done well without this Working on increasing po intake, SLP following w/ some concern for mild aspiration CXR in AM 07/07 no signs aspiration Has not required ventilatory support, doing well on O2 via the trach   DVT prophylaxis: SCD and subcutaneous heparin Code Status: FULL Family Communication:  plan for family meeting today  Disposition Plan / TOC needs: out of SDU and onto progressive status 10/18/21, PT/OT following, may be able to transfer to medsurg but need to know if they'll take patietn w/ trach/vent  Barriers to discharge / significant pending items: remains on tube feeds, diet advancing, remains on trach - may need LTAC, discussed his behavioral issues with him              Objective: Vitals:   10/23/21 1300 10/23/21 1500  10/23/21 1600 10/23/21 1654  BP: 128/89  (!) 121/109 (!) 158/111  Pulse: 86 83 87 81  Resp:  (!) 24 (!) 24 20  Temp:    98.1 F (36.7 C)  TempSrc:    Oral  SpO2:  100% 96% 95%  Weight:      Height:        Intake/Output Summary (Last 24 hours) at 10/23/2021 1758 Last data filed at 10/23/2021 1700 Gross per 24 hour  Intake 0 ml  Output 1700 ml  Net -1700 ml    Filed Weights   10/18/21 0330 10/21/21 0400 10/22/21 0500  Weight: (!) 169 kg (!) 193 kg (!) 143 kg    Examination:  Constitutional:  VS as above General Appearance: alert, well-developed, well-nourished, NAD Eyes: Normal lids and conjunctive, non-icteric sclera Ears, Nose, Mouth, Throat: Normal appearance Neck: No masses, trachea midline Trach in place, appears clean and ventilating well  No thyroid enlargement/tenderness/mass appreciated Respiratory: Normal respiratory effort Musculoskeletal:  No clubbing/cyanosis of digits Neurological: No cranial nerve deficit on limited exa Psychiatric: Normal judgment/insight Agitated, but able to be calm down. Has capacity to make informed medical decisions.       Scheduled Medications:   atorvastatin  10 mg Per Tube Daily   budesonide (PULMICORT) nebulizer solution  0.5 mg Nebulization BID   Chlorhexidine Gluconate Cloth  6 each Topical Daily   cholecalciferol  2,000 Units Per Tube Daily   enoxaparin (LOVENOX) injection  0.5 mg/kg Subcutaneous Q24H   epoetin (EPOGEN/PROCRIT) injection  20,000 Units Subcutaneous Weekly   escitalopram  5 mg Oral Daily   feeding supplement (PROSource TF)  45 mL Per Tube QID   free water  200 mL Per Tube Q4H   ipratropium-albuterol  3 mL Nebulization BID   multivitamin  1 tablet Per Tube QHS   nutrition supplement (JUVEN)  1 packet Per Tube BID BM   mouth rinse  15 mL Mouth Rinse 4 times per day   pantoprazole sodium  40 mg Per Tube BID   traZODone  50 mg Oral QHS    Continuous Infusions:  sodium chloride Stopped (10/07/21  0542)   feeding supplement (NEPRO CARB STEADY) 1,000 mL (10/22/21 2224)    PRN Medications:  acetaminophen, acetaminophen, albuterol, bisacodyl, clonazepam, heparin, loperamide, mouth rinse, oxyCODONE, pentafluoroprop-tetrafluoroeth, polyethylene glycol, QUEtiapine  Antimicrobials:  Anti-infectives (From admission, onward)    Start     Dose/Rate Route Frequency Ordered Stop   10/12/21 1200  cefTAZidime (FORTAZ) 2 g in sodium chloride 0.9 % 100 mL IVPB        2 g 200 mL/hr over 30 Minutes Intravenous 2 times daily 10/12/21 1122 10/17/21 0844   10/12/21 0417  ceFAZolin (ANCEF) IVPB 1 g/50 mL premix  Status:  Discontinued        1 g 100 mL/hr over 30 Minutes Intravenous 30 min pre-op 10/12/21 0417 10/16/21 0751   10/09/21 2000  ceFEPIme (MAXIPIME) 1 g in sodium chloride 0.9 % 100 mL IVPB  Status:  Discontinued        1 g 200 mL/hr over 30 Minutes Intravenous Every 24 hours 10/09/21 1404 10/09/21 1515   10/09/21 2000  cefTAZidime (FORTAZ) 1 g in sodium chloride 0.9 % 100 mL IVPB  Status:  Discontinued        1 g 200 mL/hr over 30 Minutes Intravenous Every 24 hours 10/09/21 1515 10/12/21 1122   10/06/21 1830  meropenem (MERREM) 500 mg in sodium chloride 0.9 % 100 mL IVPB  Status:  Discontinued        500 mg 200 mL/hr over 30 Minutes Intravenous Every 24 hours 10/06/21 1735 10/09/21 1403   10/06/21 1800  doxycycline (VIBRAMYCIN) 100 mg in sodium chloride 0.9 % 250 mL IVPB  Status:  Discontinued        100 mg 125 mL/hr over 120 Minutes Intravenous Every 12 hours 10/06/21 1657 10/08/21 0838   09/29/21 1000  ceFEPIme (MAXIPIME) 2 g in sodium chloride 0.9 % 100 mL IVPB        2 g 200 mL/hr over 30 Minutes Intravenous Every 12 hours 09/29/21 0812 09/30/21 2342   09/26/21 2200  ceFEPIme (MAXIPIME) 1 g in sodium chloride 0.9 % 100 mL IVPB  Status:  Discontinued        1 g 200 mL/hr over 30 Minutes Intravenous Every 24 hours 09/26/21 1154 09/29/21 0812   09/26/21 2000  ceFEPIme (MAXIPIME) 2 g in  sodium chloride 0.9 % 100 mL IVPB  Status:  Discontinued        2 g 200 mL/hr over 30 Minutes Intravenous Every 12 hours 09/26/21 0809 09/26/21 1154   09/24/21 1200  ceFEPIme (MAXIPIME) 2 g in sodium chloride 0.9 % 100 mL IVPB  Status:  Discontinued        2 g 200 mL/hr over 30 Minutes Intravenous Every 8 hours  09/24/21 0950 09/26/21 0809   09/21/21 1430  azithromycin (ZITHROMAX) 500 mg in sodium chloride 0.9 % 250 mL IVPB        500 mg 250 mL/hr over 60 Minutes Intravenous Every 24 hours 09/21/21 1344 09/23/21 1543   09/21/21 1430  cefTRIAXone (ROCEPHIN) 2 g in sodium chloride 0.9 % 100 mL IVPB  Status:  Discontinued        2 g 200 mL/hr over 30 Minutes Intravenous Every 24 hours 09/21/21 1344 09/24/21 0922       Data Reviewed: I have personally reviewed following labs and imaging studies  CBC: Recent Labs  Lab 10/19/21 0328 10/20/21 0426 10/21/21 0736 10/22/21 1310 10/23/21 0244  WBC 6.2 6.3 6.6 7.9 7.6  NEUTROABS 3.1 4.5 4.0 5.3 4.7  HGB 8.5* 9.1* 8.6* 9.2* 8.6*  HCT 30.9* 33.2* 32.0* 34.1* 31.9*  MCV 82.2 82.6 84.4 83.8 82.2  PLT 269 232 224 171 263    Basic Metabolic Panel: Recent Labs  Lab 10/19/21 0328 10/20/21 0426 10/21/21 0736 10/22/21 1310 10/23/21 0244  NA 147* 146*  148* 146* 147*  147* 144  K 3.8 3.2*  3.3* 3.8 3.8  3.9 3.6  CL 113* 113*  115* 111 110  110 110  CO2 '29 28  30 30 31  30 31  '$ GLUCOSE 95 92  93 112* 112*  111* 107*  BUN 69* 65*  67* 53* 40*  39* 32*  CREATININE 1.59* 1.38*  1.40* 1.40* 1.13  1.12 1.15  CALCIUM 8.3* 8.6*  8.6* 8.6* 8.7*  8.6* 8.6*  MG 1.7 1.8 2.1 1.6* 1.6*  PHOS 2.4* 2.0* 4.7* 2.1* 2.4*    GFR: Estimated Creatinine Clearance: 99 mL/min (by C-G formula based on SCr of 1.15 mg/dL). Liver Function Tests: Recent Labs  Lab 10/19/21 0328 10/20/21 0426 10/21/21 0736 10/22/21 1310 10/23/21 0244  ALBUMIN 2.4* 2.5* 2.4* 2.6* 2.4*    No results for input(s): "LIPASE", "AMYLASE" in the last 168 hours. No  results for input(s): "AMMONIA" in the last 168 hours. Coagulation Profile: No results for input(s): "INR", "PROTIME" in the last 168 hours. Cardiac Enzymes: No results for input(s): "CKTOTAL", "CKMB", "CKMBINDEX", "TROPONINI" in the last 168 hours. BNP (last 3 results) No results for input(s): "PROBNP" in the last 8760 hours. HbA1C: No results for input(s): "HGBA1C" in the last 72 hours. CBG: Recent Labs  Lab 10/21/21 1621 10/21/21 2115 10/23/21 0840 10/23/21 1256 10/23/21 1653  GLUCAP 109* 113* 108* 110* 100*    Lipid Profile: No results for input(s): "CHOL", "HDL", "LDLCALC", "TRIG", "CHOLHDL", "LDLDIRECT" in the last 72 hours. Thyroid Function Tests: No results for input(s): "TSH", "T4TOTAL", "FREET4", "T3FREE", "THYROIDAB" in the last 72 hours. Anemia Panel: No results for input(s): "VITAMINB12", "FOLATE", "FERRITIN", "TIBC", "IRON", "RETICCTPCT" in the last 72 hours. Urine analysis:    Component Value Date/Time   COLORURINE STRAW (A) 09/19/2021 1247   APPEARANCEUR CLEAR (A) 09/19/2021 1247   APPEARANCEUR Clear 04/15/2014 1240   LABSPEC 1.009 09/19/2021 1247   LABSPEC 1.013 04/15/2014 1240   PHURINE 5.0 09/19/2021 1247   GLUCOSEU 150 (A) 09/19/2021 1247   GLUCOSEU Negative 04/15/2014 1240   HGBUR NEGATIVE 09/19/2021 1247   BILIRUBINUR NEGATIVE 09/19/2021 1247   BILIRUBINUR Negative 04/15/2014 1240   KETONESUR NEGATIVE 09/19/2021 1247   PROTEINUR NEGATIVE 09/19/2021 1247   NITRITE NEGATIVE 09/19/2021 1247   LEUKOCYTESUR NEGATIVE 09/19/2021 1247   LEUKOCYTESUR Negative 04/15/2014 1240   Sepsis Labs: '@LABRCNTIP'$ (procalcitonin:4,lacticidven:4)  Recent Results (from the past 240 hour(s))  Gastrointestinal Panel by PCR , Stool     Status: None   Collection Time: 10/21/21  2:13 AM   Specimen: Stool  Result Value Ref Range Status   Campylobacter species NOT DETECTED NOT DETECTED Final   Plesimonas shigelloides NOT DETECTED NOT DETECTED Final   Salmonella species  NOT DETECTED NOT DETECTED Final   Yersinia enterocolitica NOT DETECTED NOT DETECTED Final   Vibrio species NOT DETECTED NOT DETECTED Final   Vibrio cholerae NOT DETECTED NOT DETECTED Final   Enteroaggregative E coli (EAEC) NOT DETECTED NOT DETECTED Final   Enteropathogenic E coli (EPEC) NOT DETECTED NOT DETECTED Final   Enterotoxigenic E coli (ETEC) NOT DETECTED NOT DETECTED Final   Shiga like toxin producing E coli (STEC) NOT DETECTED NOT DETECTED Final   Shigella/Enteroinvasive E coli (EIEC) NOT DETECTED NOT DETECTED Final   Cryptosporidium NOT DETECTED NOT DETECTED Final   Cyclospora cayetanensis NOT DETECTED NOT DETECTED Final   Entamoeba histolytica NOT DETECTED NOT DETECTED Final   Giardia lamblia NOT DETECTED NOT DETECTED Final   Adenovirus F40/41 NOT DETECTED NOT DETECTED Final   Astrovirus NOT DETECTED NOT DETECTED Final   Norovirus GI/GII NOT DETECTED NOT DETECTED Final   Rotavirus A NOT DETECTED NOT DETECTED Final   Sapovirus (I, II, IV, and V) NOT DETECTED NOT DETECTED Final    Comment: Performed at [facility], [address] [place], [place] [address]         Radiology Studies last 96 hours: DG Chest Port 1 View  Result Date: 10/20/2021 CLINICAL DATA:  Pulmonary edema EXAM: PORTABLE CHEST 1 VIEW COMPARISON:  10/13/2021 FINDINGS: Tracheostomy tube in place. Cardiomegaly and vascular pedicle widening with vascular congestion. Some atelectasis is likely superimposed. Remote left rib fractures. No visible effusion or pneumothorax. Lung volumes are low. IMPRESSION: Cardiomegaly and vascular congestion similar to prior. Electronically Signed   By: Jorje Guild M.D.   On: 10/20/2021 05:44            LOS: 2 days      Emeterio Reeve, DO [facility] 10/23/2021, 5:58 PM   Staff may message me via secure chat in [place]  but this may not receive immediate response,  please page for urgent matters!  If 7PM-7AM, please contact  night-coverage [email or web address]  Dictation software was used to generate the above note. Typos may occur and escape review, as with typed/written notes. Please contact Dr Sheppard Coil directly for clarity if needed.

## 2021-10-23 NOTE — Progress Notes (Addendum)
[facility]  ROUNDING NOTE   Subjective:   Gary [name] is a 66 year old male with past medical conditions including COPD requiring 2 L nasal cannula, and congestive heart failure.  Patient presents to the emergency department with shortness of breath.  Patient has been admitted for COPD exacerbation ([place]) [J44.1] Obesity hypoventilation syndrome ([place]) [E66.2] Acute on chronic diastolic congestive heart failure ([name]) [I50.33] Acute respiratory failure with hypoxia and hypercapnia ([name]) [J96.01, J96.02] Acute on chronic respiratory failure with hypoxia and hypercapnia ([name]) [G01.74, J96.22]  Patient is known and has recently been followed by Dr. Holley Raring for acute kidney injury, resolved at his May appointment.    Patient seen resting quietly in bed Breakfast tray at bedside, untouched Patient remains on tube feeds, 71m/hr Trach collar in place with speaking valve  07/09 0701 - 07/10 0700 In: 1176.5 [P.O.:480; NG/GT:696.5] Out: 950 [Urine:950] Lab Results  Component Value Date   CREATININE 1.15 10/23/2021   CREATININE 1.13 10/22/2021   CREATININE 1.12 10/22/2021     Objective:  Vital signs in last 24 hours:  Temp:  [98 F (36.7 C)-98.6 F (37 C)] 98.6 F (37 C) (07/10 1245) Pulse Rate:  [85-89] 85 (07/10 1245) Resp:  [16-23] 16 (07/10 1245) BP: (128-149)/(89-100) 128/89 (07/10 1245) SpO2:  [92 %-96 %] 92 % (07/10 1245) FiO2 (%):  [28 %] 28 % (07/10 1245)  Weight change:  Filed Weights   10/18/21 0330 10/21/21 0400 10/22/21 0500  Weight: (!) 169 kg (!) 193 kg (!) 143 kg    Intake/Output: I/O last 3 completed shifts: In: 1659.5 [P.O.:480; NG/GT:1179.5] Out: 1500 [Urine:1500]   Intake/Output this shift:  No intake/output data recorded.  Physical Exam: General: NAD  Head: Normocephalic, atraumatic.   Eyes: Anicteric  Lungs:  Diminished bases, Trach 35% FiO2  Heart: Regular rhythm  Abdomen:  Soft, nontender, obese, PEG tube in place  Extremities:  No peripheral edema.  Neurologic: Alert, respond to simple questions  Skin: No lesions  Access: None    Basic Metabolic Panel: Recent Labs  Lab 10/19/21 0328 10/20/21 0426 10/21/21 0736 10/22/21 1310 10/23/21 0244  NA 147* 146*  148* 146* 147*  147* 144  K 3.8 3.2*  3.3* 3.8 3.8  3.9 3.6  CL 113* 113*  115* 111 110  110 110  CO2 '29 28  30 30 31  30 31  '$ GLUCOSE 95 92  93 112* 112*  111* 107*  BUN 69* 65*  67* 53* 40*  39* 32*  CREATININE 1.59* 1.38*  1.40* 1.40* 1.13  1.12 1.15  CALCIUM 8.3* 8.6*  8.6* 8.6* 8.7*  8.6* 8.6*  MG 1.7 1.8 2.1 1.6* 1.6*  PHOS 2.4* 2.0* 4.7* 2.1* 2.4*     Liver Function Tests: Recent Labs  Lab 10/19/21 0328 10/20/21 0426 10/21/21 0736 10/22/21 1310 10/23/21 0244  ALBUMIN 2.4* 2.5* 2.4* 2.6* 2.4*    No results for input(s): "LIPASE", "AMYLASE" in the last 168 hours. No results for input(s): "AMMONIA" in the last 168 hours.  CBC: Recent Labs  Lab 10/19/21 0328 10/20/21 0426 10/21/21 0736 10/22/21 1310 10/23/21 0244  WBC 6.2 6.3 6.6 7.9 7.6  NEUTROABS 3.1 4.5 4.0 5.3 4.7  HGB 8.5* 9.1* 8.6* 9.2* 8.6*  HCT 30.9* 33.2* 32.0* 34.1* 31.9*  MCV 82.2 82.6 84.4 83.8 82.2  PLT 269 232 224 171 192     Cardiac Enzymes: No results for input(s): "CKTOTAL", "CKMB", "CKMBINDEX", "TROPONINI" in the last 168 hours.  BNP: Invalid input(s): "POCBNP"  CBG:  Recent Labs  Lab 10/21/21 1149 10/21/21 1621 10/21/21 2115 10/23/21 0840 10/23/21 1256  GLUCAP 142* 109* 113* 108* 110*     Microbiology: Results for orders placed or performed during the hospital encounter of 09/19/21  SARS Coronavirus 2 by RT PCR (hospital order, performed in [facility] hospital lab) *cepheid single result test* Anterior Nasal Swab     Status: None   Collection Time: 09/19/21  9:57 AM   Specimen: Anterior Nasal Swab  Result Value Ref Range Status   SARS Coronavirus 2 by RT PCR NEGATIVE NEGATIVE Final    Comment: (NOTE) SARS-CoV-2 target  nucleic acids are NOT DETECTED.  The SARS-CoV-2 RNA is generally detectable in upper and lower respiratory specimens during the acute phase of infection. The lowest concentration of SARS-CoV-2 viral copies this assay can detect is 250 copies / mL. A negative result does not preclude SARS-CoV-2 infection and should not be used as the sole basis for treatment or other patient management decisions.  A negative result may occur with improper specimen collection / handling, submission of specimen other than nasopharyngeal swab, presence of viral mutation(s) within the areas targeted by this assay, and inadequate number of viral copies (<250 copies / mL). A negative result must be combined with clinical observations, patient history, and epidemiological information.  Fact Sheet for Patients:   https://www.patel.info/  Fact Sheet for Healthcare Providers: https://hall.com/  This test is not yet approved or  cleared by the [place] FDA and has been authorized for detection and/or diagnosis of SARS-CoV-2 by FDA under an Emergency Use Authorization (EUA).  This EUA will remain in effect (meaning this test can be used) for the duration of the COVID-19 declaration under Section 564(b)(1) of the Act, 21 U.S.C. section 360bbb-3(b)(1), unless the authorization is terminated or revoked sooner.  Performed at [facility], [address] [place], [place] [address]   Culture, Respiratory w Gram Stain     Status: None   Collection Time: 09/19/21 11:55 AM   Specimen: SPU  Result Value Ref Range Status   Specimen Description   Final    SPUTUM Performed at [facility], [place] [address] [place], [place] [address]    Special Requests   Final    NONE Performed at [facility], [place], [place] [address]    Gram Stain   Final    FEW WBC PRESENT, PREDOMINANTLY PMN FEW GRAM POSITIVE COCCI IN PAIRS Performed at  [facility], [place] [address] [place], [place] [address]    Culture RARE [name]  Final   Report Status 09/24/2021 FINAL  Final   Organism ID, Bacteria [name]  Final      Susceptibility   [name] - MIC*    CEFTAZIDIME 4 SENSITIVE Sensitive     CIPROFLOXACIN <=0.25 SENSITIVE Sensitive     GENTAMICIN 2 SENSITIVE Sensitive     IMIPENEM 1 SENSITIVE Sensitive     PIP/TAZO 8 SENSITIVE Sensitive     CEFEPIME 2 SENSITIVE Sensitive     * RARE [name]  MRSA Next Gen by PCR, Nasal     Status: None   Collection Time: 09/19/21  6:10 PM   Specimen: Nasal Mucosa; Nasal Swab  Result Value Ref Range Status   MRSA by PCR Next Gen NOT DETECTED NOT DETECTED Final    Comment: (NOTE) The GeneXpert MRSA Assay (FDA approved for NASAL specimens only), is one component of a comprehensive MRSA colonization surveillance program.  It is not intended to diagnose MRSA infection nor to guide or monitor treatment for MRSA infections. Test performance is not FDA approved in patients less than 11 years old. Performed at [facility], [address] [place], [place] [address]   Gastrointestinal Panel by PCR , Stool     Status: Abnormal   Collection Time: 09/21/21 10:53 AM   Specimen: Stool  Result Value Ref Range Status   Campylobacter species NOT DETECTED NOT DETECTED Final   Plesimonas shigelloides NOT DETECTED NOT DETECTED Final   Salmonella species NOT DETECTED NOT DETECTED Final   Yersinia enterocolitica DETECTED (A) NOT DETECTED Final    Comment: RESULT CALLED TO, READ BACK BY AND VERIFIED WITH: [facility] JACKSON AT 1126 09/22/21.PMF    Vibrio species NOT DETECTED NOT DETECTED Final   Vibrio cholerae NOT DETECTED NOT DETECTED Final   Enteroaggregative E coli (EAEC) NOT DETECTED NOT DETECTED Final   Enteropathogenic E coli (EPEC) NOT DETECTED NOT DETECTED Final   Enterotoxigenic E coli (ETEC) NOT DETECTED NOT DETECTED Final   Shiga  like toxin producing E coli (STEC) NOT DETECTED NOT DETECTED Final   Shigella/Enteroinvasive E coli (EIEC) NOT DETECTED NOT DETECTED Final   Cryptosporidium NOT DETECTED NOT DETECTED Final   Cyclospora cayetanensis NOT DETECTED NOT DETECTED Final   Entamoeba histolytica NOT DETECTED NOT DETECTED Final   Giardia lamblia NOT DETECTED NOT DETECTED Final   Adenovirus F40/41 NOT DETECTED NOT DETECTED Final   Astrovirus NOT DETECTED NOT DETECTED Final   Norovirus GI/GII NOT DETECTED NOT DETECTED Final   Rotavirus A NOT DETECTED NOT DETECTED Final   Sapovirus (I, II, IV, and V) NOT DETECTED NOT DETECTED Final    Comment: Performed at [facility], [address] [place], [place] [address]  C Difficile Quick Screen w PCR reflex     Status: None   Collection Time: 09/21/21 11:30 AM   Specimen: STOOL  Result Value Ref Range Status   C Diff antigen NEGATIVE NEGATIVE Final   C Diff toxin NEGATIVE NEGATIVE Final   C Diff interpretation No C. difficile detected.  Final    Comment: Performed at [facility] [address] [place], [place] [address]  Culture, blood (Routine X 2) w Reflex to ID Panel     Status: None   Collection Time: 09/21/21 12:28 PM   Specimen: BLOOD  Result Value Ref Range Status   Specimen Description BLOOD BLOOD RIGHT HAND  Final   Special Requests   Final    BOTTLES DRAWN AEROBIC AND ANAEROBIC Blood Culture adequate volume   Culture   Final    NO GROWTH 5 DAYS Performed at [facility], [address] [place], [place] [address]    Report Status 09/26/2021 FINAL  Final  Culture, blood (Routine X 2) w Reflex to ID Panel     Status: None   Collection Time: 09/21/21  2:08 PM   Specimen: BLOOD  Result Value Ref Range Status   Specimen Description BLOOD BLOOD LEFT HAND  Final   Special Requests   Final    BOTTLES DRAWN AEROBIC AND ANAEROBIC Blood Culture adequate volume   Culture   Final    NO GROWTH 5 DAYS Performed at [facility], [address], [place], [place] [address]    Report Status 09/26/2021 FINAL  Final  Respiratory (~20 pathogens) panel by PCR     Status: None   Collection Time: 09/21/21  6:14 PM   Specimen: Nasopharyngeal Swab;  Respiratory  Result Value Ref Range Status   Adenovirus NOT DETECTED NOT DETECTED Final   Coronavirus 229E NOT DETECTED NOT DETECTED Final    Comment: (NOTE) The Coronavirus on the Respiratory Panel, DOES NOT test for the novel  Coronavirus (2019 nCoV)    Coronavirus HKU1 NOT DETECTED NOT DETECTED Final   Coronavirus NL63 NOT DETECTED NOT DETECTED Final   Coronavirus OC43 NOT DETECTED NOT DETECTED Final   Metapneumovirus NOT DETECTED NOT DETECTED Final   Rhinovirus / Enterovirus NOT DETECTED NOT DETECTED Final   Influenza A NOT DETECTED NOT DETECTED Final   Influenza B NOT DETECTED NOT DETECTED Final   Parainfluenza Virus 1 NOT DETECTED NOT DETECTED Final   Parainfluenza Virus 2 NOT DETECTED NOT DETECTED Final   Parainfluenza Virus 3 NOT DETECTED NOT DETECTED Final   Parainfluenza Virus 4 NOT DETECTED NOT DETECTED Final   Respiratory Syncytial Virus NOT DETECTED NOT DETECTED Final   Bordetella pertussis NOT DETECTED NOT DETECTED Final   Bordetella Parapertussis NOT DETECTED NOT DETECTED Final   Chlamydophila pneumoniae NOT DETECTED NOT DETECTED Final   Mycoplasma pneumoniae NOT DETECTED NOT DETECTED Final    Comment: Performed at [facility], [place]. [address], [place], [place] [address]  Culture, Respiratory w Gram Stain     Status: None   Collection Time: 10/06/21 10:59 AM   Specimen: Tracheal Aspirate; Respiratory  Result Value Ref Range Status   Specimen Description   Final    TRACHEAL ASPIRATE Performed at [facility], [address], [place], [place] [address]    Special Requests   Final    NONE Performed at [facility], [place] [place], [place] [address]    Gram Stain   Final    ABUNDANT WBC PRESENT, PREDOMINANTLY  PMN ABUNDANT GRAM NEGATIVE RODS Performed at [facility], [place] [address] [place], [place] [address]    Culture ABUNDANT [name]  Final   Report Status 10/09/2021 FINAL  Final   Organism ID, Bacteria [name]  Final      Susceptibility   [name] - MIC*    CEFTAZIDIME 8 SENSITIVE Sensitive     CIPROFLOXACIN <=0.25 SENSITIVE Sensitive     GENTAMICIN 2 SENSITIVE Sensitive     IMIPENEM 1 SENSITIVE Sensitive     * ABUNDANT [name]  Gastrointestinal Panel by PCR , Stool     Status: None   Collection Time: 10/21/21  2:13 AM   Specimen: Stool  Result Value Ref Range Status   Campylobacter species NOT DETECTED NOT DETECTED Final   Plesimonas shigelloides NOT DETECTED NOT DETECTED Final   Salmonella species NOT DETECTED NOT DETECTED Final   Yersinia enterocolitica NOT DETECTED NOT DETECTED Final   Vibrio species NOT DETECTED NOT DETECTED Final   Vibrio cholerae NOT DETECTED NOT DETECTED Final   Enteroaggregative E coli (EAEC) NOT DETECTED NOT DETECTED Final   Enteropathogenic E coli (EPEC) NOT DETECTED NOT DETECTED Final   Enterotoxigenic E coli (ETEC) NOT DETECTED NOT DETECTED Final   Shiga like toxin producing E coli (STEC) NOT DETECTED NOT DETECTED Final   Shigella/Enteroinvasive E coli (EIEC) NOT DETECTED NOT DETECTED Final   Cryptosporidium NOT DETECTED NOT DETECTED Final   Cyclospora cayetanensis NOT DETECTED NOT DETECTED Final   Entamoeba histolytica NOT DETECTED NOT DETECTED Final   Giardia lamblia NOT DETECTED NOT DETECTED Final   Adenovirus F40/41 NOT DETECTED NOT DETECTED Final   Astrovirus NOT DETECTED NOT DETECTED Final   Norovirus  GI/GII NOT DETECTED NOT DETECTED Final   Rotavirus A NOT DETECTED NOT DETECTED Final   Sapovirus (I, II, IV, and V) NOT DETECTED NOT DETECTED Final    Comment: Performed at [facility], [address] [place], [place] [address]    Coagulation Studies: No results for  input(s): "LABPROT", "INR" in the last 72 hours.  Urinalysis: No results for input(s): "COLORURINE", "LABSPEC", "PHURINE", "GLUCOSEU", "HGBUR", "BILIRUBINUR", "KETONESUR", "PROTEINUR", "UROBILINOGEN", "NITRITE", "LEUKOCYTESUR" in the last 72 hours.  Invalid input(s): "APPERANCEUR"    Imaging: No results found.   Medications:    sodium chloride Stopped (10/07/21 0542)   feeding supplement (NEPRO CARB STEADY) 1,000 mL (10/22/21 2224)    atorvastatin  10 mg Per Tube Daily   budesonide (PULMICORT) nebulizer solution  0.5 mg Nebulization BID   Chlorhexidine Gluconate Cloth  6 each Topical Daily   cholecalciferol  2,000 Units Per Tube Daily   enoxaparin (LOVENOX) injection  0.5 mg/kg Subcutaneous Q24H   epoetin (EPOGEN/PROCRIT) injection  20,000 Units Subcutaneous Weekly   escitalopram  5 mg Oral Daily   feeding supplement (PROSource TF)  45 mL Per Tube QID   free water  200 mL Per Tube Q4H   ipratropium-albuterol  3 mL Nebulization BID   multivitamin  1 tablet Per Tube QHS   nutrition supplement (JUVEN)  1 packet Per Tube BID BM   mouth rinse  15 mL Mouth Rinse 4 times per day   pantoprazole sodium  40 mg Per Tube BID   traZODone  50 mg Oral QHS   acetaminophen, acetaminophen, albuterol, bisacodyl, clonazepam, heparin, HYDROmorphone (DILAUDID) injection, loperamide, mouth rinse, oxyCODONE, pentafluoroprop-tetrafluoroeth, polyethylene glycol, QUEtiapine  Assessment/ Plan:  [name] is a 66 y.o.  male with past medical conditions including COPD requiring 2 L nasal cannula, and congestive heart failure.  Patient presents to the emergency department with shortness of breath.  Patient has been admitted for COPD exacerbation ([place]) [J44.1] Obesity hypoventilation syndrome ([place]) [E66.2] Acute on chronic diastolic congestive heart failure ([name]) [I50.33] Acute respiratory failure with hypoxia and hypercapnia ([name]) [J96.01, J96.02] Acute on chronic respiratory failure with hypoxia  and hypercapnia ([name]) [G89.16, J96.22]    Acute Kidney Injury cause unidentified.  Patient did not have any IV contrast exposure and had only a brief hypotensive event.  -Renal Ultrasound did show  mild right obstruction , simple cyst in  left kidney and bladder distendion.   Renal function has recovered and urine output remains acceptable. BUN has now improved also. No further dialysis required at this time.    Lab Results  Component Value Date   CREATININE 1.15 10/23/2021   CREATININE 1.13 10/22/2021   CREATININE 1.12 10/22/2021    Intake/Output Summary (Last 24 hours) at 10/23/2021 1317 Last data filed at 10/23/2021 1027 Gross per 24 hour  Intake 696.5 ml  Output 950 ml  Net -253.5 ml    Acute respiratory failure, Pseudomonas noted in sputum on September 19, 2021. IV antibiotics completed Monitoring of respiratory status as per pulmonary/critical care. -Remains on trach collar at 35% FiO2, speaking valve in place  Chronic diastolic heart failure.  Echo from 08/07/2021 shows EF 60 to 65% with a mildly dilated left ventricular cavity and grade 1 diastolic dysfunction. Well compensated  Anemia of critical illness, Hemoglobin 8.6, continue weekly subcu EPO 20,000 units.   Hypernatremia/Hypokalemia Sodium 144, potassium 3.6   Agitation No agitation noted today  Due to renal recovery, we will sign off at this time. Feel free  to contact us with questions or concerns.    LOS: [place] 7/10/20231:17 PM

## 2021-10-23 NOTE — Progress Notes (Signed)
Occupational Therapy Treatment Patient Details Name: Gary [name] MRN: [id number] DOB: 11/03/1955 Today's Date: 10/23/2021   History of present illness Patient was admitted secondary to SOB, COPD exacerbation, acute metabolic encephalopathy, and acute on chronic hypoxic and hypercapnic respiratory failure in setting of acute decompensated HFpEF and AECOPD requiring intubation and mechanical ventilation. The pt is now s/p tracheostomy and PEG tube placement 11/05/21.   OT comments  Pt seen for OT treatment on this date. Upon arrival to room pt awake and alert, lying upright in bed. Pt reported they had a BM in bed. Pt agreeable to tx focused on rolling in bed for pericare. Pt required MAX A +2 with mod vcs for sequencing for pericare bed level. Upon rolling, noticed a ulcer on pt bottom, notified nurse. SUPERVISION/SETUP with min vcs for technique for clearing secretions with yankauer. Pt educated on speech therapy instructions on time parameters for PMV and not to wear PMV while sleeping. Pt verbalized understanding of instruction provided. Nurse notified PMV was removed. Pt left in bed with all needs met. Pt making good progress toward goals. Pt continues to benefit from skilled OT services to maximize return to PLOF and minimize risk of future falls, injury. Will continue to follow POC. Discharge recommendation remains appropriate.     Recommendations for follow up therapy are one component of a multi-disciplinary discharge planning process, led by the attending physician.  Recommendations may be updated based on patient status, additional functional criteria and insurance authorization.    Follow Up Recommendations  Skilled nursing-short term rehab (<3 hours/day)    Assistance Recommended at Discharge Frequent or constant Supervision/Assistance  Patient can return home with the following  Two people to help with walking and/or transfers;Assistance with cooking/housework;Direct supervision/assist  for medications management;Assist for transportation;Two people to help with bathing/dressing/bathroom   Equipment Recommendations  Other (comment) (defer next venue of care)    Recommendations for Other Services      Precautions / Restrictions Precautions Precautions: Fall Precaution Comments: IJ, G tube, trach Restrictions Weight Bearing Restrictions: No       Mobility Bed Mobility Overal bed mobility: Needs Assistance Bed Mobility: Rolling Rolling: Max assist, +2 for physical assistance              Transfers                   General transfer comment: remained bed level during session     Balance                                           ADL either performed or assessed with clinical judgement   ADL Overall ADL's : Needs assistance/impaired                                       General ADL Comments: MAX A +2 for pericare bed level.SUPERVISION/SETUP with min vcs for technique for clearing secretions with yankauer.      Cognition Arousal/Alertness: Awake/alert Behavior During Therapy: WFL for tasks assessed/performed Overall Cognitive Status: No family/caregiver present to determine baseline cognitive functioning Area of Impairment: Safety/judgement, Following commands, Problem solving, Awareness                       Following Commands: Follows one step commands consistently  Safety/Judgement: Decreased awareness of safety, Decreased awareness of deficits Awareness: Intellectual Problem Solving: Slow processing                     Pertinent Vitals/ Pain       Pain Assessment Pain Assessment: Faces Faces Pain Scale: No hurt   Frequency  Min 2X/week        Progress Toward Goals  OT Goals(current goals can now be found in the care plan section)  Progress towards OT goals: Progressing toward goals  Acute Rehab OT Goals Patient Stated Goal: to return to PLOF OT Goal Formulation: With  patient Time For Goal Achievement: 10/30/21 Potential to Achieve Goals: Fair ADL Goals Pt Will Perform Grooming: with set-up;sitting Pt Will Perform Lower Body Dressing: with min assist;with adaptive equipment;sitting/lateral leans Pt Will Transfer to Toilet: with min assist;stand pivot transfer;bedside commode  Plan Discharge plan remains appropriate;Frequency remains appropriate       AM-PAC OT "6 Clicks" Daily Activity     Outcome Measure   Help from another person eating meals?: A Little Help from another person taking care of personal grooming?: A Lot Help from another person toileting, which includes using toliet, bedpan, or urinal?: Total Help from another person bathing (including washing, rinsing, drying)?: A Lot Help from another person to put on and taking off regular upper body clothing?: A Little Help from another person to put on and taking off regular lower body clothing?: Total 6 Click Score: 12    End of Session Equipment Utilized During Treatment: Other (comment) (none)  OT Visit Diagnosis: Other abnormalities of gait and mobility (R26.89);Muscle weakness (generalized) (M62.81)   Activity Tolerance Patient tolerated treatment well;Patient limited by fatigue   Patient Left in bed;with call bell/phone within reach;with bed alarm set   Nurse Communication Mobility status;Other (comment) (removal of PMV)        Time: 8168-3870 OT Time Calculation (min): 36 min  Charges: OT General Charges $OT Visit: 1 Visit OT Treatments $Self Care/Home Management : 23-37 mins  [employer] OTDS  [employer] 10/23/2021, 4:20 PM

## 2021-10-23 NOTE — Progress Notes (Signed)
Physical Therapy Treatment Patient Details Name: Gary [name] MRN: [id number] DOB: [birth year] Today's Date: 10/23/2021   History of Present Illness Patient was admitted secondary to SOB, COPD exacerbation, acute metabolic encephalopathy, and acute on chronic hypoxic and hypercapnic respiratory failure in setting of acute decompensated HFpEF and AECOPD requiring intubation and mechanical ventilation. The pt is now s/p tracheostomy and PEG tube placement 11/05/21.    PT Comments    Pt was pleasant and motivated to participate during the session and put forth good effort throughout. Pt complete bed mobility with +3 maxA for trunk control and leg management and physical/verbal cues for sequencing. Pt able to complete 2 STS with +3 maxA to initiate stand and for steadying once upright and only able to maintain each stand ~15-20sec; physical/verbal cuing throughout for sequencing and needing rest between each stand. Improved ability to initiate stand and increased clearance from prior session but still slightly forward flexed and heavily reliant on UE support and physical assist. Pt cued for controlled sit but still has very poor eccentric control. Pt will benefit from PT services in a SNF setting upon discharge to safely address deficits listed in patient problem list for decreased caregiver assistance and eventual return to PLOF.     Recommendations for follow up therapy are one component of a multi-disciplinary discharge planning process, led by the attending physician.  Recommendations may be updated based on patient status, additional functional criteria and insurance authorization.  Follow Up Recommendations  Skilled nursing-short term rehab (<3 hours/day) Can patient physically be transported by private vehicle: No   Assistance Recommended at Discharge Frequent or constant Supervision/Assistance  Patient can return home with the following Two people to help with walking and/or transfers;Two  people to help with bathing/dressing/bathroom;Assistance with cooking/housework;Assist for transportation;Help with stairs or ramp for entrance   Equipment Recommendations  Other (comment) (TBD)    Recommendations for Other Services       Precautions / Restrictions Precautions Precautions: Fall Precaution Comments: IJ, G tube, trach Restrictions Weight Bearing Restrictions: No     Mobility  Bed Mobility Overal bed mobility: Needs Assistance Bed Mobility: Rolling, Supine to Sit, Sit to Supine Rolling: Max assist   Supine to sit: Max assist Sit to supine: Max assist   General bed mobility comments: maxA +3 for for trunk control and leg management; verbal/physical cues needed for sequencing    Transfers Overall transfer level: Needs assistance Equipment used: Rolling walker (2 wheels) Transfers: Sit to/from Stand Sit to Stand: Max assist, From elevated surface           General transfer comment: +3 maxA to initiate stand despite high elevated surface and w/ knee block. heavy cuing needed for sequencing and hand placement. improved initiation to stand and increased clearance, still slightly forward flexed and unable to maintain upright position for long    Ambulation/Gait               General Gait Details: unable to perform at this time   Stairs             Wheelchair Mobility    Modified Rankin (Stroke Patients Only)       Balance   Sitting-balance support: Bilateral upper extremity supported, Feet supported Sitting balance-[name] Scale: Fair     Standing balance support: Bilateral upper extremity supported, Reliant on assistive device for balance, During functional activity Standing balance-[name] Scale: Poor Standing balance comment: increased clearance and unable to maintain position for long  Cognition Arousal/Alertness: Awake/alert Behavior During Therapy: WFL for tasks assessed/performed Overall  Cognitive Status: No family/caregiver present to determine baseline cognitive functioning                                          Exercises      General Comments        Pertinent Vitals/Pain Pain Assessment Pain Assessment: No/denies pain    Home Living                          Prior Function            PT Goals (current goals can now be found in the care plan section) Progress towards PT goals: Progressing toward goals    Frequency    Min 2X/week      PT Plan Current plan remains appropriate    Co-evaluation              AM-PAC PT "6 Clicks" Mobility   Outcome Measure  Help needed turning from your back to your side while in a flat bed without using bedrails?: A Lot Help needed moving from lying on your back to sitting on the side of a flat bed without using bedrails?: A Lot Help needed moving to and from a bed to a chair (including a wheelchair)?: A Lot Help needed standing up from a chair using your arms (e.g., wheelchair or bedside chair)?: A Lot Help needed to walk in hospital room?: Total Help needed climbing 3-5 steps with a railing? : Total 6 Click Score: 10    End of Session Equipment Utilized During Treatment: Gait belt Activity Tolerance: Patient tolerated treatment well Patient left: in bed;with call bell/phone within reach Nurse Communication: Mobility status PT Visit Diagnosis: Unsteadiness on feet (R26.81);Muscle weakness (generalized) (M62.81);Difficulty in walking, not elsewhere classified (R26.2)     Time: 2229-7989 PT Time Calculation (min) (ACUTE ONLY): 40 min  Charges:                        Turner Daniels, SPT  10/23/2021, 12:45 PM

## 2021-10-24 ENCOUNTER — Encounter
Admission: EM | Disposition: A | Discharge status: Home / Self Care | Payer: Self-pay | Source: Home / Self Care | Attending: Internal Medicine

## 2021-10-24 DIAGNOSIS — J9621 Acute and chronic respiratory failure with hypoxia: Secondary | ICD-10-CM | POA: Diagnosis not present

## 2021-10-24 DIAGNOSIS — J9622 Acute and chronic respiratory failure with hypercapnia: Secondary | ICD-10-CM | POA: Diagnosis not present

## 2021-10-24 DIAGNOSIS — R079 Chest pain, unspecified: Secondary | ICD-10-CM | POA: Diagnosis not present

## 2021-10-24 DIAGNOSIS — I4581 Long QT syndrome: Secondary | ICD-10-CM | POA: Diagnosis not present

## 2021-10-24 LAB — RENAL FUNCTION PANEL
Albumin: 2.5 g/dL — ABNORMAL LOW (ref 3.5–5.0)
Anion gap: 5 (ref 5–15)
BUN: 24 mg/dL — ABNORMAL HIGH (ref 8–23)
CO2: 31 mmol/L (ref 22–32)
Calcium: 8.4 mg/dL — ABNORMAL LOW (ref 8.9–10.3)
Chloride: 105 mmol/L (ref 98–111)
Creatinine, Ser: 1.03 mg/dL (ref 0.61–1.24)
GFR, Estimated: >60 mL/min (ref >60)
Glucose, Bld: 103 mg/dL — ABNORMAL HIGH (ref 70–99)
Phosphorus: 3.4 mg/dL (ref 2.5–4.6)
Potassium: 3.4 mmol/L — ABNORMAL LOW (ref 3.5–5.1)
Sodium: 141 mmol/L (ref 135–145)

## 2021-10-24 LAB — CBC WITH DIFFERENTIAL/PLATELET
Abs Immature Granulocytes: 0.07 10*3/uL (ref 0.00–0.07)
Basophils Absolute: 0 10*3/uL (ref 0.0–0.1)
Basophils Relative: 1 %
Eosinophils Absolute: 0.1 10*3/uL (ref 0.0–0.5)
Eosinophils Relative: 2 %
HCT: 32 % — ABNORMAL LOW (ref 39.0–52.0)
Hemoglobin: 8.7 g/dL — ABNORMAL LOW (ref 13.0–17.0)
Immature Granulocytes: 1 %
Lymphocytes Relative: 25 %
Lymphs Abs: 1.7 10*3/uL (ref 0.7–4.0)
MCH: 22.3 pg — ABNORMAL LOW (ref 26.0–34.0)
MCHC: 27.2 g/dL — ABNORMAL LOW (ref 30.0–36.0)
MCV: 82.1 fL (ref 80.0–100.0)
Monocytes Absolute: 0.7 10*3/uL (ref 0.1–1.0)
Monocytes Relative: 11 %
Neutro Abs: 4 10*3/uL (ref 1.7–7.7)
Neutrophils Relative %: 60 %
Platelets: 156 10*3/uL (ref 150–400)
RBC: 3.9 MIL/uL — ABNORMAL LOW (ref 4.22–5.81)
RDW: 17.2 % — ABNORMAL HIGH (ref 11.5–15.5)
WBC: 6.6 10*3/uL (ref 4.0–10.5)
nRBC: 1.2 % — ABNORMAL HIGH (ref 0.0–0.2)

## 2021-10-24 LAB — GLUCOSE, CAPILLARY
Glucose-Capillary: 100 mg/dL — ABNORMAL HIGH (ref 70–99)
Glucose-Capillary: 109 mg/dL — ABNORMAL HIGH (ref 70–99)
Glucose-Capillary: 110 mg/dL — ABNORMAL HIGH (ref 70–99)
Glucose-Capillary: 110 mg/dL — ABNORMAL HIGH (ref 70–99)
Glucose-Capillary: 87 mg/dL (ref 70–99)
Glucose-Capillary: 98 mg/dL (ref 70–99)
Glucose-Capillary: 98 mg/dL (ref 70–99)

## 2021-10-24 LAB — TROPONIN I (HIGH SENSITIVITY)
Troponin I (High Sensitivity): 9 ng/L (ref <18)
Troponin I (High Sensitivity): 8 ng/L (ref <18)

## 2021-10-24 LAB — MAGNESIUM: Magnesium: 1.5 mg/dL — ABNORMAL LOW (ref 1.7–2.4)

## 2021-10-24 SURGERY — DIALYSIS/PERMA CATHETER INSERTION
Anesthesia: General

## 2021-10-24 SURGERY — DIALYSIS/PERMA CATHETER INSERTION
Anesthesia: Choice

## 2021-10-24 MED ORDER — NEPRO/CARBSTEADY PO LIQD
237.0000 mL | Freq: Three times a day (TID) | ORAL | Status: DC
  Administered 2021-10-24: 237 mL via ORAL

## 2021-10-24 MED ORDER — PROSOURCE TF PO LIQD
90.0000 mL | Freq: Three times a day (TID) | ORAL | Status: DC
Start: 2021-10-24 — End: 2021-11-07
  Administered 2021-10-25 – 2021-11-01 (×7): 90 mL
  Filled 2021-10-24 (×33): qty 90

## 2021-10-24 MED ORDER — LIDOCAINE 5 % EX PTCH
1.0000 | MEDICATED_PATCH | CUTANEOUS | Status: AC
  Administered 2021-10-24 – 2021-10-27 (×4): 1 via TRANSDERMAL
  Filled 2021-10-24 (×7): qty 1

## 2021-10-24 MED ORDER — DEXTROSE 10 % IV SOLN: INTRAVENOUS | Status: AC

## 2021-10-24 MED ORDER — NEPRO/CARBSTEADY PO LIQD
1000.0000 mL | ORAL | Status: DC
  Administered 2021-10-24 – 2021-10-25 (×2): 1000 mL

## 2021-10-24 MED ORDER — FREE WATER
200.0000 mL | Freq: Every day | Status: DC
  Administered 2021-10-24 – 2021-10-31 (×20): 200 mL

## 2021-10-24 MED ORDER — NITROGLYCERIN 0.4 MG SL SUBL
0.4000 mg | SUBLINGUAL_TABLET | SUBLINGUAL | Status: DC | PRN
  Filled 2021-10-24: qty 1

## 2021-10-24 NOTE — Progress Notes (Addendum)
       CROSS COVER NOTE  NAME: Gary [name] MRN: [id number] DOB : 07/11/1955    Date of Service   10/24/21  HPI/Events of Note   Secure chat received from nursing reporting 8/10 chest pain that started when staff was repositioning patient in bed.  Gary [name] describes the pain as aching located in his (L) chest. He denies dyspnea, nausea, vomiting, abdominal pain, diaphoresis, or radiation of the pain. The pain is reproducible on exam and is made worse by palpating directly over where he indicates the pain is located. Breath sounds are diminished in the bases and otherwise clear to auscultation. S1, S2 heart sounds present no murmurs, rubs, or gallops appreciated. Patient reports chest pain is resolving by the end of my exam/interview and reports 3/10 pain at this time without intervention.  CBG is also 87 and down trending since tube feeds stopped at midnight.  Interventions   Plan: EKG Troponin (patient is refusing lab sticks, current IV does not pull back blood and is painful. IV team consult placed for PIV replacement and draw of AM labs) PRN Nitroglycerin Lidocaine patch Continue ordered pain meds for musculoskeletal pain D10 @ 50 while off tube feeds      This document was prepared using Dragon voice recognition software and may include unintentional dictation errors.  Neomia Glass DNP, MHA, FNP-BC Nurse Practitioner [facility] Pager [phone]

## 2021-10-24 NOTE — Progress Notes (Signed)
Patient refused morning labs. Patient asked to be suction he would not allow me to suction him. Oxygen saturations are 100% at this time. Will continue to monitor.

## 2021-10-24 NOTE — Progress Notes (Signed)
Nutrition Follow Up Note   DOCUMENTATION CODES:   Morbid obesity  INTERVENTION:   Change to nocturnal feeds of Nepro _0 /hr x 14 hrs overnight (run from 1800-0800)  Pro-Source TF 54m TID via tube  Free water flushes 2074m 6 times daily via tube   Regimen provides 2004kcal/day (meets 72% estimated needs), 145g/day protein (meets 100% of pt's estimated needs) and 191215may of free water.   Cholecalciferol 2000 units daily via tube   Juven Fruit Punch BID via tube, each serving provides 95kcal and 2.5g of protein (amino acids glutamine and arginine)  Rena-vit daily via tube   48 hour calorie count   NUTRITION DIAGNOSIS:   Inadequate oral intake related to inability to eat (pt sedated and ventilated) as evidenced by NPO status. -resolving  GOAL:   Patient will meet greater than or equal to 90% of their needs -met with tube feeds   MONITOR:   PO intake, Supplement acceptance, Labs, Weight trends, TF tolerance, Skin, I & O's  ASSESSMENT:   [age] male with h/o PUD, OSA, HTN, CKD III, substance abuse, CHF, Hep C and COPD and COVID 19 (02/2021) who is admitted with COPD exacerbation, CHF and AKI. 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  -Pt s/p tracheostomy 6/20 -Pt s/p 22F IR G-tube 6/30  Met with pt in room today. Pt remains on trach collar. Pt reports that his appetite is good. Pt wants to be able to stop the tube feeds as he reports they are making him too full to eat. Pt NPO this morning for planned perm cath. Pt's lunch tray is sitting on his side table with only bites taken from it. RN reports that pt's oral intake has been fairly good. RD will change pt over to nocturnal tube feeds to meet 75% of his estimated needs to allow pt to eat more during the day. Will order a 48 hour calorie count to determine how much of pt's estimated needs he is meeting via oral intake. Depending on pt's oral intake, tube feeds may be able to be decreased more. RD  will add Nepro supplements to see if pt will tolerate this. Per chart, pt is down 12lbs(3%) since admission. Pt continues on HD.   Medications reviewed and include: vitamin D, lovenox, epoetin, rena-vit, juven, protonix   Labs reviewed: K 3.4(L), P 3.4 wnl, Mg 1.5(L) Hgb 8.7(L), Hct 32.0(L), MCH 22.3(L), MCHC 27.2(L) Cbgs- 110, 100, 87, 109 x 24 hrs  UOP- 950m48my   Nutrition Focused Physical Exam:  Flowsheet Row Most Recent Value  Orbital Region No depletion  Upper Arm Region No depletion  Thoracic and Lumbar Region No depletion  Buccal Region No depletion  Temple Region No depletion  Clavicle Bone Region Mild depletion  Clavicle and Acromion Bone Region Mild depletion  Scapular Bone Region No depletion  Dorsal Hand No depletion  Patellar Region No depletion  Anterior Thigh Region No depletion  Posterior Calf Region No depletion  Edema (RD Assessment) Mild  Hair Reviewed  Eyes Reviewed  Mouth Reviewed  Skin Reviewed  Nails Reviewed   Diet Order:    Diet Order             Diet 2 gram sodium Room service appropriate? Yes; Fluid consistency: Thin  Diet effective now                  EDUCATION NEEDS:   No education needs have been identified at this time  Skin:  Skin Assessment:  Skin Integrity Issues: Skin Integrity Issues:: Incisions, Stage II, Other (Comment) Stage II: medial perineum Incisions: closed neck s/p trach Other: MASD to rt groin  Last BM:  7/10- type 6  Height:   Ht Readings from Last 1 Encounters:  09/28/21 _0  (1.905 m)    Weight:   Wt Readings from Last 1 Encounters:  10/24/21 (!) 166 kg    Ideal Body Weight:  89 kg  BMI:  Body mass index is 45.74 kg/m.  Estimated Nutritional Needs:   Kcal:  2800-3100kcal/day  Protein:  >140g/day  Fluid:  2.0L/day  Koleen Distance MS, RD, LDN Please refer to [facility] for RD and/or RD on-call/weekend/after hours pager

## 2021-10-24 NOTE — Progress Notes (Signed)
Mobility Specialist - Progress Note   10/24/21 1100  Mobility  Activity Dangled on edge of bed  Level of Assistance Standby assist, set-up cues, supervision of patient - no hands on  Assistive Device None  Distance Ambulated (ft) 0 ft  Activity Response Tolerated well  $Mobility charge 1 Mobility     Pre-mobility: 87 HR, 85% SpO2 During mobility: 87-95% SpO2 Post-mobility: 83 HR, 93% SpO2   Pt yelling out for help upon arrival, stating he's having trouble breathing. Pt utilizing 5L via trach collar, however, trach not properly placed on pt. Pt educated and encouraged to use trach collar to assist with breathing, but pt initially refuses and states he just needs to be sat up. Pt able to come to EOB with supervision and extra time; HOB elevated. Pt dangled EOB ~30 minutes, denying chest pain throughout session. Pt then agreeable for proper positioning of trach collar while sitting EOB and this improved sats to low-mid 90s. Pt returned supine with assist on LE. Pt able to use bed rails with minA +2 to reposition to [facility]. Pt left semi-supine with needs in reach.    Kathee Delton Mobility Specialist 10/24/21, 11:11 AM

## 2021-10-24 NOTE — Progress Notes (Signed)
Speech Language Pathology Treatment: Dysphagia  Patient Details Name: Gary [name] MRN: [id number] DOB: 10-29-1955 Today's Date: 10/24/2021 Time: 1220-1300 SLP Time Calculation (min) (ACUTE ONLY): 40 min  Assessment / Plan / Recommendation Clinical Impression  Pt seen for Dysphagia tx goals today. He has met his PMV goals and is now wearing the PMV PRN w/ intermittent Supervision -- to ensure he does not sleep w/ it on. Unsure of pt's current insight and mental status as he requires MOD cues and redirection at times per NSG and staff. He required Min-Mod verbal cues and positioning support during session today -- to get and sit in bed and more Upright(towel roll/pillow used behind the head although pt stated "you've got me too high up".  Pt does exhibit decreased awareness/insight into his medical status and limitations -- often requested/demanded that he be allowed to "get up"; "I can walk just get me that walker". Attempted redirection often during beginning of the session.    Unsure of pt's current insight and mental status. Recommend f/u w/ MD, family in order to best communicate w/ pt and present the current situation. Pt may benefit from a formal Cognitive assessment post Discharge to his next Rehab setting in order to address any needs in setting of ADLs.  OF NOTE: PT OFTEN DID NOT WEAR THE TC O2 MASK FOR O2 SUPPORT D/T THE NECK STRAP BEING "TOO TIGHT". Monitored O2 during session.  Pt consumed trials of purees, Mech Soft/Regular foods, and thin liquids during this session. Subtle, delayed cough(mild) noted intermittently b/t trials -- it did not appear directly related to a swallow/trial. Unsure if related to pulmonary status, to positioning in bed w/ chin down. Also encouraged pt to NOT take multiple sips and pinched the straw as a strategy to limit volume/bolus amount when needed to. Instructed/educated pt on taking SINGLE, SMALL sips during further trials. No immediate, overt cough, no  decline in vocal quality; no decline in respiratory status during/post trials. O2 sats remained 96-98%. Pt consumed ~6ozs of thin liquids via straw. Oral phase was grossly adequate for bolus management; oral clearing was appropriate b/t trials/consistencies. Mastication of cooked solids was grossly adequate -- boluses were moistened well to aid mastication. Time given for full mastication/clearing. Educated pt on the need for Time b/t trials to not increase exertion and SOB/WOB d/t pt's Baseline Pulmonary status/decline and reduced Stamina. Pt required support and full feeding assistance -- he did not attempt to engage in self-feeding during meal.    D/t pt's overall presentation in setting of current illness w/ recent tracheostomy/PEG/lengthy time on vent support, along w/ min declined mental status, risk for aspiration is increased. Recommend continue current diet as ordered by MD but w/ more Mech Soft foods/gravies to moisten foods; Thin liquids; aspiration precautions -- PINCHED STRAW to limit bolus volume; reduce Distractions during meals and engage pt at meal for self-feeding as able. REFLUX precs. Pills Whole in Puree for safer swallowing. Support w/ feeding at meals as needed -- encourage pt to participate in self-feeding. PT MUST WEAR THE PMV FOR ALL ORAL INTAKE. REST BREAKS DURING MEALS IF NEEDED. MD/NSG updated.    ST services can be available to follow pt for toleration of diet while admitted for ongoing toleration of oral intake, risk for aspiration, and education as needed. Precautions posted in room. MD/NSG updated.        HPI HPI: Per H&P "66 y/o male with multiple medical problems causing chronic hypercapneic respiratory failure who has had admissions in  the past for exacerbations of the same and mechanical ventilation presented for evaluation of dyspnea, weakness. It is unclear if the patient has been compliant with his medications and NIMV at home.  He was admitted by the hospitalists  for a presumed COPD exacerbation in the ER, treated with bronchodilators and NIMV but his mental status and hypercarbia worsened despite those interventions so he required intubation.  PCCM consulted for admission.  He could not provide history for [place] because he was encephalopathic.".   Pt intubated 09/19/21, tracheostomy placed 10/03/21, and PEG placed 10/13/21.    PMH includes: diastolic CHF (EF 60 to 03%, grade III DD, 02/2021), COPD on home O2 2 L, HTN, OSA on CPAP, PVD, hyperlipidemia, former smoker, Morbid Obesity with a BMI 49.65, GI bleeding, CKD-3, iron deficiency anemia, who presents with shortness of breath.   Patient has had Multiple hospitalizations in recent months.      SLP Plan  Continue with current plan of care (x1 this week b/f signing off)      Recommendations for follow up therapy are one component of a multi-disciplinary discharge planning process, led by the attending physician.  Recommendations may be updated based on patient status, additional functional criteria and insurance authorization.    Recommendations  Diet recommendations: Regular;Thin liquid ((regular, 2gm sodum ordered by MD)) Liquids provided via: Cup;Straw (monitor for impulsive drinking) Medication Administration: Whole meds with puree (as needed for safer swallowing) Supervision: Staff to assist with self feeding;Intermittent supervision to cue for compensatory strategies (encourage pt to feed himself) Compensations: Minimize environmental distractions;Lingual sweep for clearance of pocketing;Slow rate;Small sips/bites;Follow solids with liquid Postural Changes and/or Swallow Maneuvers: Out of bed for meals;Seated upright 90 degrees;Upright 30-60 min after meal      Patient may use Passy-Muir Speech Valve: During all therapies with supervision;During all waking hours (remove during sleep);During PO intake/meals PMSV Supervision: Intermittent MD: Please consider changing trach tube to :  (TBD by ENT)          Oral Care Recommendations: Oral care BID;Oral care before and after PO;Staff/trained caregiver to provide oral care Follow Up Recommendations: Follow physician's recommendations for discharge plan and follow up therapies Assistance recommended at discharge: Frequent or constant Supervision/Assistance (d/t mental status; deconditioning) SLP Visit Diagnosis: Dysphagia, unspecified (R13.10) Plan: Continue with current plan of care (x1 this week b/f signing off)             Orinda Kenner, [place], [place]; [place] [phone] (ascom) Yeiden Frenkel  10/24/2021, 3:50 PM

## 2021-10-24 NOTE — Progress Notes (Signed)
Unable to get new IV or labs. Spoke to lab tech will try and get a.m. labs around 0700. Will pass off to oncoming shift.

## 2021-10-24 NOTE — Progress Notes (Signed)
Physical Therapy Treatment Patient Details Name: Gary [name] MRN: [id number] DOB: 05/31/1955 Today's Date: 10/24/2021   History of Present Illness Patient was admitted secondary to SOB, COPD exacerbation, acute metabolic encephalopathy, and acute on chronic hypoxic and hypercapnic respiratory failure in setting of acute decompensated HFpEF and AECOPD requiring intubation and mechanical ventilation. The pt is now s/p tracheostomy and PEG tube placement 11/05/21.    PT Comments    Pt slightly agitated and wanting to be hoyered into recliner and needing encouragement to participate with therapy. Pt educated on PT's role in order to improve mobility with pt agreeable to participate. Pt completed bed mobility w/ +2 maxA for trunk control and leg management and cuing for sequencing; improved ability compared to last session. Pt completed 2 STS w/ +3 maxA to initiate stand and for steadying; once up only able to maintain ~15-20sec. Continues to improve upright posture and ability to initiate stand but still needing fairly elevated surface and physical assistance. Heavy UE support needed to RW w/ BLE locked in extension to maintain stand, unable unlock knees and eccentrically control sit. Attempted mini squats and standing march in place w/ pt unable to perform either. Pt will benefit from PT services in a SNF setting upon discharge to safely address deficits listed in patient problem list for decreased caregiver assistance and eventual return to PLOF.   Recommendations for follow up therapy are one component of a multi-disciplinary discharge planning process, led by the attending physician.  Recommendations may be updated based on patient status, additional functional criteria and insurance authorization.  Follow Up Recommendations  Skilled nursing-short term rehab (<3 hours/day) Can patient physically be transported by private vehicle: No   Assistance Recommended at Discharge Frequent or constant  Supervision/Assistance  Patient can return home with the following Two people to help with walking and/or transfers;Two people to help with bathing/dressing/bathroom;Assistance with cooking/housework;Assist for transportation;Help with stairs or ramp for entrance   Equipment Recommendations  Other (comment)    Recommendations for Other Services       Precautions / Restrictions Precautions Precautions: Fall Precaution Comments: IJ, G tube, trach Restrictions Weight Bearing Restrictions: No     Mobility  Bed Mobility Overal bed mobility: Needs Assistance Bed Mobility: Supine to Sit, Sit to Supine     Supine to sit: HOB elevated, Max assist, +2 for physical assistance Sit to supine: Max assist, +2 for physical assistance        Transfers Overall transfer level: Needs assistance Equipment used: Rolling walker (2 wheels) Transfers: Sit to/from Stand Sit to Stand: Max assist, From elevated surface (+3 physical assistance)           General transfer comment: able to complete 2 STS w/ +3 maxA; improvement with initiating, clearance, and upright posture. cuing for hand placement and sequencing    Ambulation/Gait               General Gait Details: unable to perform at this time   Stairs             Wheelchair Mobility    Modified Rankin (Stroke Patients Only)       Balance Overall balance assessment: Needs assistance Sitting-balance support: Bilateral upper extremity supported, Feet supported Sitting balance-[name] Scale: Fair     Standing balance support: Bilateral upper extremity supported, During functional activity, Reliant on assistive device for balance Standing balance-[name] Scale: Poor  Cognition Arousal/Alertness: Awake/alert Behavior During Therapy: WFL for tasks assessed/performed Overall Cognitive Status: No family/caregiver present to determine baseline cognitive functioning                                           Exercises General Exercises - Lower Extremity Ankle Circles/Pumps: Strengthening, Both, 10 reps Quad Sets: Strengthening, Both, 10 reps Gluteal Sets: Strengthening, Both, 10 reps Long Arc Quad: Strengthening, Both, 10 reps Hip Flexion/Marching: Seated, Strengthening, Both, 10 reps    General Comments        Pertinent Vitals/Pain Pain Assessment Pain Assessment: No/denies pain    Home Living                          Prior Function            PT Goals (current goals can now be found in the care plan section) Progress towards PT goals: Progressing toward goals    Frequency    Min 2X/week      PT Plan Current plan remains appropriate    Co-evaluation              AM-PAC PT "6 Clicks" Mobility   Outcome Measure  Help needed turning from your back to your side while in a flat bed without using bedrails?: A Lot Help needed moving from lying on your back to sitting on the side of a flat bed without using bedrails?: A Lot Help needed moving to and from a bed to a chair (including a wheelchair)?: A Lot Help needed standing up from a chair using your arms (e.g., wheelchair or bedside chair)?: A Lot Help needed to walk in hospital room?: Total Help needed climbing 3-5 steps with a railing? : Total 6 Click Score: 10    End of Session Equipment Utilized During Treatment: Gait belt Activity Tolerance: Patient tolerated treatment well Patient left: in bed;with call bell/phone within reach Nurse Communication: Mobility status PT Visit Diagnosis: Unsteadiness on feet (R26.81);Muscle weakness (generalized) (M62.81);Difficulty in walking, not elsewhere classified (R26.2)     Time: 1435-1510 PT Time Calculation (min) (ACUTE ONLY): 35 min  Charges:                        Turner Daniels, SPT  10/24/2021, 4:14 PM

## 2021-10-24 NOTE — Progress Notes (Signed)
Patient reported having 8 out of 10 chest pain and abdominal pain after turning patient in bed. Patient reported is to be a aching pain. EKG ordered and prn nitro. NP at bedside. EKG: normal sinus rhythm   0427: Patient reports that his chest pain has subsided some. NP want s me to hold nitro at this time.

## 2021-10-24 NOTE — Progress Notes (Signed)
PROGRESS NOTE    Gary [name]  YTK:[id number] DOB: 07-Oct-1955  DOA: 09/19/2021 Date of Service: 10/24/21 PCP: Center, [facility] Course:  66 y/o male with multiple medical problems causing chronic hypercapneic respiratory failure who has had admissions in the past for exacerbations of the same and mechanical ventilation presented for evaluation of dyspnea, weakness. It is unclear if the patient has been compliant with his medications and NIMV at home.  He was admitted by the hospitalists for a presumed COPD exacerbation in the ER, treated with bronchodilators and NIMV but his mental status and hypercarbia worsened despite those interventions so he required intubation.   6/6: Presented to ED.  Required intubation and mechanical ventilation in the ED.  PCCM admit ICU 6/7: Hold diuresis due to worsening Creatinine.  ENT consulted for Trach placement per family request 6/8: Pt continues to spike temps tmax 101.8 degrees F.  Flexiseal placed overnight draining foul smelling watery stool~Cdiff and GI panel pending  6/9: Fever resolved.  Negative for C- diff, GI panel still pending.  Creatinine slightly worsened, requiring low dose levophed (2 mcg). Holding diuresis 6/10: propofol discontinued due hypertriglyceridemia; failed SBT 6/11: Tracheal aspirate from 6/6 resulting with Pseudomonas, ABX changed to Cefepime. neurologically intact, awaiting Trach placement 6/12: Pt calmer today following addition of oral benzo's, narcotics, and Seroquel via tube.  Creatinine worsened with decreased UOP, holding diuresis, low threshold for Nephrology consult  6/13: Pt with worsening renal failure creatinine 4.15.  UOP overnight 25 ml.  Nephrology consulted plans to start pt on hemodialysis follow dialysis catheter placement Pt underwent hemodialysis without ultrafiltration 06/14: .  Plans for HD session today.  Agitation has improved significantly with narcotics and antianxiety medications  per tube   06/16: Tracheostomy canceled 06/15 due to hyperkalemia and tentatively rescheduled for 06/20  6/18 failure to wean from vent 06/20: ENT placed Tracheostomy (#7.0 XLT cuffed).  Developed mild epistaxis post unsuccessful attempt at NGT placement.  Later developed hematochezia/melena, Protonix gtt started and GI consulted. 06/21: Diuresed yesterday with limited results. Patient had softed blood pressures throughout the day and diuresis was held as a result. HD was performed by the patient clotted off the machine and was not able to be rinsed back. Repeat hemoglobin is above 8. 06/23: Pt remains mechanically intubated settings: FiO2 50%/PEEP 10.  CXR concerning for moderate right pleural effusion with associated right basilar atelectasis vs. infiltrate.  Will perform [place] Chest to determine if pt needs right sided thoracentesis  06/24: Remains on the ventilator, FiO2 requirements decreasing, respiratory culture with abundant gram-negative rods: [name], on Meropenem 06/25: Oxygen requirements down to 40%, chest x-ray shows improvement on dense right consolidation, Pseudomonas susceptibilities pending 06/25: Pt remains on the ventilator settings: PEEP 10 and FiO2 40%; will attempt to wean PEEP to 5 to perform SBT  06/28: Pt tolerated PS on 06/27.  Overnight pt had episodes of vomiting TF's suspect secondary to malposition of nasogastric tube.  Will consult IR for PEG tube placement if unable to place PEG tube pt will need postpyloric Dobbhoff   06/29: Pt tolerated PS 5/5 for over an hour yesterday, however due to fatigue placed back on PRVC.  Pt pulled NGT out several times overnight, therefore will not attempt to reinsert.  Plans for PEG tube placement per IR on 06/30.   06/30: PEG tube placed per IR  07/1: Pt tolerated trach collar '@40'$ % throughout the day 07/2: Pt placed on ventilator overnight settings 16/5/35% tolerating well.  Will place pt on trach collar today and place orders  for PT/OT 7/3: Hemodynamically stable.  Tolerating TC (has remained on TC since 7/2 @ 08:00), plan for speech therapy and PT/OT 7/4: Has maintained on trach collar for greater than 48 hours, tolerating Passy-Muir valve.  Plan to transfer out of ICU. SLP recs: dysphagia 1 (puree) w/ nectar liquid.  7/5-7/6: sodium and creatinine trending appropriately, Hgb stable. Low K repleted and stable. Was off vent qhs (orders d/c or missed?) and was restarted. Will need LTAC, TOC following  7/7: pulled out trach overnight. Confusion re: vent plan among staff and myself. I asked PCCM to reevluate him and verify plan so the team and the patient can eb on the same page, and will help TOC arrange appropriate placement.  7/8: I spoke personally w/ Dr Milon Dikes [facility]) and he confirms patient does NOT need ventilator support, unless of course develops distress, but last night the patient tolerated well without it. Plan to meet w/ family today when they visit, I spoke w/ his sister yesterday on the phone. No concerns on CBC/BMP: Cr tending down a bit, Hgb stable, Na slightly high but improving over past few days. He is approaching medically stable and may be appropriate for LTAC vs SNF soon  7/9: Several long discussions with patient and family about medical condition, behavioral issues.  See separate progress notes.  Psychiatry evaluated, agree that he has capacity, started SSRI. 7/10: Patient remains medically stable, awaiting placement.  Discussed his behavioral issues again and encouraged him to always bring any concerns to nursing staff or to me calmly and I am happy to listen, discussed professionalism towards staff, he seems amenable. 7/11: goal for today OOB to chair, needing hoyer lift, pt reeducated several times about staying in bed   Consultants:  PCCU Nephrology ENT GI IR Psychiatry  Procedures: 10/03/21: tracheostomy placement 10/12/21: dialysis permacath placement  10/13/21: G-tube  placed    Subjective: Patient says he wants to stretch his legs get OOB to chair      ASSESSMENT & PLAN:   Principal Problem:   Acute on chronic respiratory failure with hypoxia and hypercapnia ([name]) Active Problems:   Major depressive disorder, recurrent episode, moderate ([name])   Acute on chronic diastolic congestive heart failure ([name])   COPD with acute exacerbation ([name])   Acute metabolic encephalopathy   HTN (hypertension)   Obesity, Class III, BMI 40-49.9 (morbid obesity) ([place])   Obstructive sleep apnea   Chronic kidney disease, stage 3a ([place])   HLD (hyperlipidemia)   Pressure injury of skin   No problem-specific Assessment & Plan notes found for this encounter.  Acute on Chronic Hypoxic & Hypercapnic Respiratory Failure in the setting of acute decompensated HFpEF, AECOPD, Pseudomonas Pneumonia, & OSA/OHS Moderate right sided pleural effusion -ruled out, actually lung CONSOLIDATION -S/p  tracheostomy by ENT by 6/20 -Trach collar w/ ventilator support as needed / qhs but he does NOT need vent supoort overnight routinely per my conversation w/ Dr Milon Dikes 07/08 -Completed 10 day course of ceftazidime for treatment  of Pseudomonas -At this point, he is relatively medically stable but placement is going to be an issue for him.  I have discussed that his behavioral issues are impeding placement.   Acute decompensated HFpEF PMHx: Hypertension  Echocardiogram 08/07/2021: LVEF 60 to 53%, grade 1 diastolic dysfunction, normal RV systolic function -Continuous cardiac monitoring -Maintain MAP >65 -Was on intermittent hemodialysis for volume removal - has not needed HD and has remained compensated  -  cancelled permacath placement    AKI superimposed CKD stage IIIa  Hypokalemia  Hypernatremia During admissions in February 2023, creatinine 1.8 with GFR 40; in May 2023 creatinine 1.1 with GFR of 60 -Monitor I&O's / urinary output -Follow BMP  -Cr down, good UOP noted, d/c dialysis  catheter at this time, monitor renal parameters daily. 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 Diff negative   Acute Blood Loss Anemia~RESOLVED Nasopharyngeal trauma vs Upper GI bleed -Trend CBC  -Monitor for s/sx of bleeding and transfuse for Hgb <7 -Continue BID IV PPI -per GI, no need for scope  -nephrology recommends consider epogen as outpatient    Hyperglycemia ~STABLE -CBG's achs -SSI  Acute Metabolic Encephalopathy in setting of CO2 Narcosis Sedation needs in setting of mechanical ventilation~RESOLVED  CT Head 09/19/21: negative;  UDS negative -Treatment of hypercapnia as outlined above -Maintain a RASS goal of 0  -Continue oxycodone and klonopin prn due to agitation/delirium but limit these if possible  Trach in place Gtube in place Was Requiring ventilatory support at night, has done well without this Working on increasing po intake, SLP and dietary following  Has not required ventilatory support, doing well on O2 via the trach      DVT prophylaxis:lovenox Code Status: FULL Family Communication:  plan for family meeting today  Disposition Plan / TOC needs: out of SDU and onto progressive status 10/18/21, PT/OT following, may be able to transfer to medsurg but need to know if they'll take patietn w/ trach/vent  Barriers to discharge / significant pending items: remains on tube feeds, diet advancing, remains on trach - may need LTAC, discussed his behavioral issues with him              Objective: Vitals:   10/24/21 1121 10/24/21 1212 10/24/21 1528 10/24/21 1550  BP: 120/85   (!) 144/93  Pulse: 82 80 87 83  Resp: (!) '22 20 20 '$ (!) 22  Temp: 98.5 F (36.9 C)   98.3 F (36.8 C)  TempSrc: Oral   Oral  SpO2: 97% 96% 96% 97%  Weight:       Height:        Intake/Output Summary (Last 24 hours) at 10/24/2021 1613 Last data filed at 10/24/2021 1100 Gross per 24 hour  Intake 2609.5 ml  Output 1650 ml  Net 959.5 ml    Filed Weights   10/21/21 0400 10/22/21 0500 10/24/21 0330  Weight: (!) 193 kg (!) 143 kg (!) 166 kg    Examination:  Constitutional:  VS as above General Appearance: alert, well-developed, well-nourished, NAD Eyes: Normal lids and conjunctive, non-icteric sclera Ears, Nose, Mouth, Throat: Normal appearance Neck: No masses, trachea midline Trach in place, appears clean and ventilating well  No thyroid enlargement/tenderness/mass appreciated Respiratory: Normal respiratory effort Musculoskeletal:  No clubbing/cyanosis of digits Neurological: No cranial nerve deficit on limited exa Psychiatric: Normal judgment/insight Agitated occasionally, but able to be calmed down - he has good rapport w/ me says he believes god sent me here to help him  Has capacity to make informed medical decisions.       Scheduled Medications:   atorvastatin  10 mg Per Tube Daily   budesonide (PULMICORT) nebulizer solution  0.5 mg Nebulization BID   Chlorhexidine Gluconate Cloth  6 each Topical Daily   cholecalciferol  2,000 Units Per Tube  Daily   enoxaparin (LOVENOX) injection  0.5 mg/kg Subcutaneous Q24H   epoetin (EPOGEN/PROCRIT) injection  20,000 Units Subcutaneous Weekly   escitalopram  5 mg Oral Daily   feeding supplement (NEPRO CARB STEADY)  1,000 mL Per Tube Q24H   feeding supplement (NEPRO CARB STEADY)  237 mL Oral TID BM   feeding supplement (PROSource TF)  90 mL Per Tube TID   free water  200 mL Per Tube 6 X Daily   ipratropium-albuterol  3 mL Nebulization BID   lidocaine  1 patch Transdermal Q24H   multivitamin  1 tablet Per Tube QHS   nutrition supplement (JUVEN)  1 packet Per Tube BID BM   mouth rinse  15 mL Mouth Rinse 4 times per day   pantoprazole sodium  40 mg Per Tube BID   traZODone  50 mg  Oral QHS    Continuous Infusions:  sodium chloride Stopped (10/07/21 0542)    PRN Medications:  acetaminophen, acetaminophen, albuterol, bisacodyl, clonazepam, heparin, loperamide, nitroGLYCERIN, mouth rinse, oxyCODONE, pentafluoroprop-tetrafluoroeth, polyethylene glycol, QUEtiapine  Antimicrobials:  Anti-infectives (From admission, onward)    Start     Dose/Rate Route Frequency Ordered Stop   10/12/21 1200  cefTAZidime (FORTAZ) 2 g in sodium chloride 0.9 % 100 mL IVPB        2 g 200 mL/hr over 30 Minutes Intravenous 2 times daily 10/12/21 1122 10/17/21 0844   10/12/21 0417  ceFAZolin (ANCEF) IVPB 1 g/50 mL premix  Status:  Discontinued        1 g 100 mL/hr over 30 Minutes Intravenous 30 min pre-op 10/12/21 0417 10/16/21 0751   10/09/21 2000  ceFEPIme (MAXIPIME) 1 g in sodium chloride 0.9 % 100 mL IVPB  Status:  Discontinued        1 g 200 mL/hr over 30 Minutes Intravenous Every 24 hours 10/09/21 1404 10/09/21 1515   10/09/21 2000  cefTAZidime (FORTAZ) 1 g in sodium chloride 0.9 % 100 mL IVPB  Status:  Discontinued        1 g 200 mL/hr over 30 Minutes Intravenous Every 24 hours 10/09/21 1515 10/12/21 1122   10/06/21 1830  meropenem (MERREM) 500 mg in sodium chloride 0.9 % 100 mL IVPB  Status:  Discontinued        500 mg 200 mL/hr over 30 Minutes Intravenous Every 24 hours 10/06/21 1735 10/09/21 1403   10/06/21 1800  doxycycline (VIBRAMYCIN) 100 mg in sodium chloride 0.9 % 250 mL IVPB  Status:  Discontinued        100 mg 125 mL/hr over 120 Minutes Intravenous Every 12 hours 10/06/21 1657 10/08/21 0838   09/29/21 1000  ceFEPIme (MAXIPIME) 2 g in sodium chloride 0.9 % 100 mL IVPB        2 g 200 mL/hr over 30 Minutes Intravenous Every 12 hours 09/29/21 0812 09/30/21 2342   09/26/21 2200  ceFEPIme (MAXIPIME) 1 g in sodium chloride 0.9 % 100 mL IVPB  Status:  Discontinued        1 g 200 mL/hr over 30 Minutes Intravenous Every 24 hours 09/26/21 1154 09/29/21 0812   09/26/21 2000   ceFEPIme (MAXIPIME) 2 g in sodium chloride 0.9 % 100 mL IVPB  Status:  Discontinued        2 g 200 mL/hr over 30 Minutes Intravenous Every 12 hours 09/26/21 0809 09/26/21 1154   09/24/21 1200  ceFEPIme (MAXIPIME) 2 g in sodium chloride 0.9 % 100 mL IVPB  Status:  Discontinued  2 g 200 mL/hr over 30 Minutes Intravenous Every 8 hours 09/24/21 0950 09/26/21 0809   09/21/21 1430  azithromycin (ZITHROMAX) 500 mg in sodium chloride 0.9 % 250 mL IVPB        500 mg 250 mL/hr over 60 Minutes Intravenous Every 24 hours 09/21/21 1344 09/23/21 1543   09/21/21 1430  cefTRIAXone (ROCEPHIN) 2 g in sodium chloride 0.9 % 100 mL IVPB  Status:  Discontinued        2 g 200 mL/hr over 30 Minutes Intravenous Every 24 hours 09/21/21 1344 09/24/21 0922       Data Reviewed: I have personally reviewed following labs and imaging studies  CBC: Recent Labs  Lab 10/20/21 0426 10/21/21 0736 10/22/21 1310 10/23/21 0244 10/24/21 0840  WBC 6.3 6.6 7.9 7.6 6.6  NEUTROABS 4.5 4.0 5.3 4.7 4.0  HGB 9.1* 8.6* 9.2* 8.6* 8.7*  HCT 33.2* 32.0* 34.1* 31.9* 32.0*  MCV 82.6 84.4 83.8 82.2 82.1  PLT 232 224 171 192 765    Basic Metabolic Panel: Recent Labs  Lab 10/20/21 0426 10/21/21 0736 10/22/21 1310 10/23/21 0244 10/24/21 0840  NA 146*  148* 146* 147*  147* 144 141  K 3.2*  3.3* 3.8 3.8  3.9 3.6 3.4*  CL 113*  115* 111 110  110 110 105  CO2 '28  30 30 31  30 31 31  '$ GLUCOSE 92  93 112* 112*  111* 107* 103*  BUN 65*  67* 53* 40*  39* 32* 24*  CREATININE 1.38*  1.40* 1.40* 1.13  1.12 1.15 1.03  CALCIUM 8.6*  8.6* 8.6* 8.7*  8.6* 8.6* 8.4*  MG 1.8 2.1 1.6* 1.6* 1.5*  PHOS 2.0* 4.7* 2.1* 2.4* 3.4    GFR: Estimated Creatinine Clearance: 120 mL/min (by C-G formula based on SCr of 1.03 mg/dL). Liver Function Tests: Recent Labs  Lab 10/20/21 0426 10/21/21 0736 10/22/21 1310 10/23/21 0244 10/24/21 0840  ALBUMIN 2.5* 2.4* 2.6* 2.4* 2.5*    No results for input(s): "LIPASE",  "AMYLASE" in the last 168 hours. No results for input(s): "AMMONIA" in the last 168 hours. Coagulation Profile: No results for input(s): "INR", "PROTIME" in the last 168 hours. Cardiac Enzymes: No results for input(s): "CKTOTAL", "CKMB", "CKMBINDEX", "TROPONINI" in the last 168 hours. BNP (last 3 results) No results for input(s): "PROBNP" in the last 8760 hours. HbA1C: No results for input(s): "HGBA1C" in the last 72 hours. CBG: Recent Labs  Lab 10/24/21 0002 10/24/21 0333 10/24/21 0832 10/24/21 1135 10/24/21 1554  GLUCAP 109* 87 100* 110* 98    Lipid Profile: No results for input(s): "CHOL", "HDL", "LDLCALC", "TRIG", "CHOLHDL", "LDLDIRECT" in the last 72 hours. Thyroid Function Tests: No results for input(s): "TSH", "T4TOTAL", "FREET4", "T3FREE", "THYROIDAB" in the last 72 hours. Anemia Panel: No results for input(s): "VITAMINB12", "FOLATE", "FERRITIN", "TIBC", "IRON", "RETICCTPCT" in the last 72 hours. Urine analysis:    Component Value Date/Time   COLORURINE STRAW (A) 09/19/2021 1247   APPEARANCEUR CLEAR (A) 09/19/2021 1247   APPEARANCEUR Clear 04/15/2014 1240   LABSPEC 1.009 09/19/2021 1247   LABSPEC 1.013 04/15/2014 1240   PHURINE 5.0 09/19/2021 1247   GLUCOSEU 150 (A) 09/19/2021 1247   GLUCOSEU Negative 04/15/2014 1240   HGBUR NEGATIVE 09/19/2021 1247   BILIRUBINUR NEGATIVE 09/19/2021 1247   BILIRUBINUR Negative 04/15/2014 [place] 09/19/2021 1247   PROTEINUR NEGATIVE 09/19/2021 1247   NITRITE NEGATIVE 09/19/2021 1247   LEUKOCYTESUR NEGATIVE 09/19/2021 1247   LEUKOCYTESUR Negative 04/15/2014 1240   Sepsis  Labs: '@LABRCNTIP'$ (procalcitonin:4,lacticidven:4)  Recent Results (from the past 240 hour(s))  Gastrointestinal Panel by PCR , Stool     Status: None   Collection Time: 10/21/21  2:13 AM   Specimen: Stool  Result Value Ref Range Status   Campylobacter species NOT DETECTED NOT DETECTED Final   Plesimonas shigelloides NOT DETECTED NOT  DETECTED Final   Salmonella species NOT DETECTED NOT DETECTED Final   Yersinia enterocolitica NOT DETECTED NOT DETECTED Final   Vibrio species NOT DETECTED NOT DETECTED Final   Vibrio cholerae NOT DETECTED NOT DETECTED Final   Enteroaggregative E coli (EAEC) NOT DETECTED NOT DETECTED Final   Enteropathogenic E coli (EPEC) NOT DETECTED NOT DETECTED Final   Enterotoxigenic E coli (ETEC) NOT DETECTED NOT DETECTED Final   Shiga like toxin producing E coli (STEC) NOT DETECTED NOT DETECTED Final   Shigella/Enteroinvasive E coli (EIEC) NOT DETECTED NOT DETECTED Final   Cryptosporidium NOT DETECTED NOT DETECTED Final   Cyclospora cayetanensis NOT DETECTED NOT DETECTED Final   Entamoeba histolytica NOT DETECTED NOT DETECTED Final   Giardia lamblia NOT DETECTED NOT DETECTED Final   Adenovirus F40/41 NOT DETECTED NOT DETECTED Final   Astrovirus NOT DETECTED NOT DETECTED Final   Norovirus GI/GII NOT DETECTED NOT DETECTED Final   Rotavirus A NOT DETECTED NOT DETECTED Final   Sapovirus (I, II, IV, and V) NOT DETECTED NOT DETECTED Final    Comment: Performed at [facility], [address], [place], [place] [address]         Radiology Studies last 96 hours: No results found.          LOS: 35 days      Emeterio Reeve, DO [facility] 10/24/2021, 4:13 PM   Staff may message me via secure chat in [place]  but this may not receive immediate response,  please page for urgent matters!  If 7PM-7AM, please contact night-coverage [email or web address]  Dictation software was used to generate the above note. Typos may occur and escape review, as with typed/written notes. Please contact Dr Sheppard Coil directly for clarity if needed.

## 2021-10-25 DIAGNOSIS — Z7189 Other specified counseling: Secondary | ICD-10-CM | POA: Diagnosis not present

## 2021-10-25 LAB — BASIC METABOLIC PANEL
Anion gap: 5 (ref 5–15)
BUN: 21 mg/dL (ref 8–23)
CO2: 31 mmol/L (ref 22–32)
Calcium: 8.5 mg/dL — ABNORMAL LOW (ref 8.9–10.3)
Chloride: 106 mmol/L (ref 98–111)
Creatinine, Ser: 1.05 mg/dL (ref 0.61–1.24)
GFR, Estimated: >60 mL/min (ref >60)
Glucose, Bld: 103 mg/dL — ABNORMAL HIGH (ref 70–99)
Potassium: 3.4 mmol/L — ABNORMAL LOW (ref 3.5–5.1)
Sodium: 142 mmol/L (ref 135–145)

## 2021-10-25 LAB — RENAL FUNCTION PANEL
Albumin: 2.6 g/dL — ABNORMAL LOW (ref 3.5–5.0)
Anion gap: 5 (ref 5–15)
BUN: 22 mg/dL (ref 8–23)
CO2: 31 mmol/L (ref 22–32)
Calcium: 8.3 mg/dL — ABNORMAL LOW (ref 8.9–10.3)
Chloride: 105 mmol/L (ref 98–111)
Creatinine, Ser: 1.11 mg/dL (ref 0.61–1.24)
GFR, Estimated: >60 mL/min (ref >60)
Glucose, Bld: 102 mg/dL — ABNORMAL HIGH (ref 70–99)
Phosphorus: 4.1 mg/dL (ref 2.5–4.6)
Potassium: 3.3 mmol/L — ABNORMAL LOW (ref 3.5–5.1)
Sodium: 141 mmol/L (ref 135–145)

## 2021-10-25 LAB — GLUCOSE, CAPILLARY
Glucose-Capillary: 110 mg/dL — ABNORMAL HIGH (ref 70–99)
Glucose-Capillary: 105 mg/dL — ABNORMAL HIGH (ref 70–99)
Glucose-Capillary: 92 mg/dL (ref 70–99)
Glucose-Capillary: 94 mg/dL (ref 70–99)
Glucose-Capillary: 94 mg/dL (ref 70–99)

## 2021-10-25 LAB — CBC WITH DIFFERENTIAL/PLATELET
Abs Immature Granulocytes: 0.06 10*3/uL (ref 0.00–0.07)
Basophils Absolute: 0.1 10*3/uL (ref 0.0–0.1)
Basophils Relative: 1 %
Eosinophils Absolute: 0.1 10*3/uL (ref 0.0–0.5)
Eosinophils Relative: 1 %
HCT: 33.6 % — ABNORMAL LOW (ref 39.0–52.0)
Hemoglobin: 9.1 g/dL — ABNORMAL LOW (ref 13.0–17.0)
Immature Granulocytes: 1 %
Lymphocytes Relative: 25 %
Lymphs Abs: 2 10*3/uL (ref 0.7–4.0)
MCH: 22.2 pg — ABNORMAL LOW (ref 26.0–34.0)
MCHC: 27.1 g/dL — ABNORMAL LOW (ref 30.0–36.0)
MCV: 82.2 fL (ref 80.0–100.0)
Monocytes Absolute: 0.9 10*3/uL (ref 0.1–1.0)
Monocytes Relative: 12 %
Neutro Abs: 4.7 10*3/uL (ref 1.7–7.7)
Neutrophils Relative %: 60 %
Platelets: 147 10*3/uL — ABNORMAL LOW (ref 150–400)
RBC: 4.09 MIL/uL — ABNORMAL LOW (ref 4.22–5.81)
RDW: 17.2 % — ABNORMAL HIGH (ref 11.5–15.5)
WBC: 7.8 10*3/uL (ref 4.0–10.5)
nRBC: 0.9 % — ABNORMAL HIGH (ref 0.0–0.2)

## 2021-10-25 LAB — MAGNESIUM: Magnesium: 1.7 mg/dL (ref 1.7–2.4)

## 2021-10-25 LAB — HEPATITIS B SURFACE ANTIGEN: Hepatitis B Surface Ag: NONREACTIVE

## 2021-10-25 MED ORDER — POTASSIUM CHLORIDE CRYS ER 20 MEQ PO TBCR
40.0000 meq | EXTENDED_RELEASE_TABLET | Freq: Once | ORAL | Status: AC
Start: 2021-10-25 — End: 2021-10-25
  Administered 2021-10-25: 40 meq via ORAL
  Filled 2021-10-25: qty 2

## 2021-10-25 MED ORDER — POTASSIUM CHLORIDE CRYS ER 20 MEQ PO TBCR
20.0000 meq | EXTENDED_RELEASE_TABLET | Freq: Every day | ORAL | Status: DC
  Administered 2021-10-26 – 2021-11-03 (×9): 20 meq via ORAL
  Filled 2021-10-25 (×8): qty 1

## 2021-10-25 MED ORDER — EPOETIN ALFA 10000 UNIT/ML IJ SOLN
20000.0000 [IU] | INTRAMUSCULAR | Status: DC
  Administered 2021-10-25: 20000 [IU] via SUBCUTANEOUS
  Filled 2021-10-25 (×2): qty 2

## 2021-10-25 NOTE — Progress Notes (Signed)
PROGRESS NOTE    Gary [name]  CBU:[id number] DOB: Jun 13, 1955  DOA: 09/19/2021 Date of Service: 10/25/21 PCP: [facility],  Hospital Course:  66 y/o male with multiple medical problems causing chronic hypercapneic respiratory failure who has had admissions in the past for exacerbations of the same and mechanical ventilation presented for evaluation of dyspnea, weakness. It is unclear if the patient has been compliant with his medications and NIMV at home.  He was admitted by the hospitalists for a presumed COPD exacerbation in the ER, treated with bronchodilators and NIMV but his mental status and hypercarbia worsened despite those interventions so he required intubation.   6/6: Presented to ED.  Required intubation and mechanical ventilation in the ED.  PCCM admit ICU 6/7: Hold diuresis due to worsening Creatinine.  ENT consulted for Trach placement per family request 6/8: Pt continues to spike temps tmax 101.8 degrees F.  Flexiseal placed overnight draining foul smelling watery stool~Cdiff and GI panel pending  6/9: Fever resolved.  Negative for C- diff, GI panel still pending.  Creatinine slightly worsened, requiring low dose levophed (2 mcg). Holding diuresis 6/10: propofol discontinued due hypertriglyceridemia; failed SBT 6/11: Tracheal aspirate from 6/6 resulting with Pseudomonas, ABX changed to Cefepime. neurologically intact, awaiting Trach placement 6/12: Pt calmer today following addition of oral benzo's, narcotics, and Seroquel via tube.  Creatinine worsened with decreased UOP, holding diuresis, low threshold for Nephrology consult  6/13: Pt with worsening renal failure creatinine 4.15.  UOP overnight 25 ml.  Nephrology consulted plans to start pt on hemodialysis follow dialysis catheter placement Pt underwent hemodialysis without ultrafiltration 06/14: .  Plans for HD session today.  Agitation has improved significantly with narcotics and antianxiety medications  per tube   06/16: Tracheostomy canceled 06/15 due to hyperkalemia and tentatively rescheduled for 06/20  6/18 failure to wean from vent 06/20: ENT placed Tracheostomy (#7.0 XLT cuffed).  Developed mild epistaxis post unsuccessful attempt at NGT placement.  Later developed hematochezia/melena, Protonix gtt started and GI consulted. 06/21: Diuresed yesterday with limited results. Patient had softed blood pressures throughout the day and diuresis was held as a result. HD was performed by the patient clotted off the machine and was not able to be rinsed back. Repeat hemoglobin is above 8. 06/23: Pt remains mechanically intubated settings: FiO2 50%/PEEP 10.  CXR concerning for moderate right pleural effusion with associated right basilar atelectasis vs. infiltrate.  Will perform [place] Chest to determine if pt needs right sided thoracentesis  06/24: Remains on the ventilator, FiO2 requirements decreasing, respiratory culture with abundant gram-negative rods: [name], on Meropenem 06/25: Oxygen requirements down to 40%, chest x-ray shows improvement on dense right consolidation, Pseudomonas susceptibilities pending 06/25: Pt remains on the ventilator settings: PEEP 10 and FiO2 40%; will attempt to wean PEEP to 5 to perform SBT  06/28: Pt tolerated PS on 06/27.  Overnight pt had episodes of vomiting TF's suspect secondary to malposition of nasogastric tube.  Will consult IR for PEG tube placement if unable to place PEG tube pt will need postpyloric Dobbhoff   06/29: Pt tolerated PS 5/5 for over an hour yesterday, however due to fatigue placed back on PRVC.  Pt pulled NGT out several times overnight, therefore will not attempt to reinsert.  Plans for PEG tube placement per IR on 06/30.   06/30: PEG tube placed per IR  07/1: Pt tolerated trach collar '@40'$ % throughout the day 07/2: Pt placed on ventilator overnight settings 16/5/35% tolerating well.  Will place pt on trach collar today and place orders  for PT/OT 7/3: Hemodynamically stable.  Tolerating TC (has remained on TC since 7/2 @ 08:00), plan for speech therapy and PT/OT 7/4: Has maintained on trach collar for greater than 48 hours, tolerating Passy-Muir valve.  Plan to transfer out of ICU. SLP recs: dysphagia 1 (puree) w/ nectar liquid.  7/5-7/6: sodium and creatinine trending appropriately, Hgb stable. Low K repleted and stable. Was off vent qhs (orders d/c or missed?) and was restarted. Will need LTAC, TOC following  7/7: pulled out trach overnight. Confusion re: vent plan among staff and myself. I asked PCCM to reevluate him and verify plan so the team and the patient can eb on the same page, and will help TOC arrange appropriate placement.  7/8: I spoke personally w/ Dr Milon Dikes [facility]) and he confirms patient does NOT need ventilator support, unless of course develops distress, but last night the patient tolerated well without it. Plan to meet w/ family today when they visit, I spoke w/ his sister yesterday on the phone. No concerns on CBC/BMP: Cr tending down a bit, Hgb stable, Na slightly high but improving over past few days. He is approaching medically stable and may be appropriate for LTAC vs SNF soon  7/9: Several long discussions with patient and family about medical condition, behavioral issues.  See separate progress notes.  Psychiatry evaluated, agree that he has capacity, started SSRI. 7/10: Patient remains medically stable, awaiting placement.  Discussed his behavioral issues again and encouraged him to always bring any concerns to nursing staff or to me calmly and I am happy to listen, discussed professionalism towards staff, he seems amenable. 7/11-7/12: goal OOB to chair, needing hoyer lift, pt reeducated several times about staying in bed 07/11 and has been copliant through 07/12  Consultants:  [facility] E[facility] GI IR Psychiatry  Procedures: 10/03/21: tracheostomy placement 10/12/21: dialysis permacath placement   10/13/21: G-tube placed    Subjective: Patient resting today, denies pain, denies SOB     ASSESSMENT & PLAN:   Principal Problem:   Acute on chronic respiratory failure with hypoxia and hypercapnia ([name]) Active Problems:   Major depressive disorder, recurrent episode, moderate ([name])   Acute on chronic diastolic congestive heart failure ([place])   COPD with acute exacerbation ([place])   Acute metabolic encephalopathy   HTN (hypertension)   Obesity, Class III, BMI 40-49.9 (morbid obesity) ([place])   Obstructive sleep apnea   Chronic kidney disease, stage 3a ([place])   HLD (hyperlipidemia)   Pressure injury of skin   No problem-specific Assessment & Plan notes found for this encounter.  Acute on Chronic Hypoxic & Hypercapnic Respiratory Failure in the setting of acute decompensated HFpEF, AECOPD, Pseudomonas Pneumonia, & OSA/OHS Moderate right sided pleural effusion -ruled out, actually lung CONSOLIDATION -S/p  tracheostomy by ENT by 6/20 -Trach collar w/ ventilator support as needed / qhs but he does NOT need vent supoort overnight routinely per my conversation w/ Dr Milon Dikes 07/08 -Completed 10 day course of ceftazidime for treatment  of Pseudomonas -At this point, he is relatively medically stable but placement is going to be an issue for him.  I have discussed that his behavioral issues are impeding placement.   Acute decompensated HFpEF PMHx: Hypertension  Echocardiogram 08/07/2021: LVEF 60 to 59%, grade 1 diastolic dysfunction, normal RV systolic function -Continuous cardiac monitoring -Maintain MAP >65 -Was on intermittent hemodialysis for volume removal - has not needed HD and has remained compensated  -cancelled permacath  placement    AKI superimposed CKD stage IIIa  Hypokalemia  Hypernatremia During admissions in February 2023, creatinine 1.8 with GFR 40; in May 2023 creatinine 1.1 with GFR of 60 -Monitor I&O's / urinary output -Follow BMP  -Cr down, good UOP noted, d/c  dialysis catheter at this time, monitor renal parameters daily. 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 Diff negative   Acute Blood Loss Anemia~RESOLVED Nasopharyngeal trauma vs Upper GI bleed -Trend CBC  -Monitor for s/sx of bleeding and transfuse for Hgb <7 -Continue BID IV PPI -per GI, no need for scope  -nephrology recommends consider epogen as outpatient    Hyperglycemia ~STABLE -CBG's achs -SSI  Acute Metabolic Encephalopathy in setting of CO2 Narcosis Sedation needs in setting of mechanical ventilation~RESOLVED  CT Head 09/19/21: negative;  UDS negative -Treatment of hypercapnia as outlined above -Maintain a RASS goal of 0  -Continue oxycodone and klonopin prn due to agitation/delirium but limit these if possible  Trach in place Gtube in place Was Requiring ventilatory support at night, has done well without this Working on increasing po intake, SLP and dietary following  Has not required ventilatory support, doing well on O2 via the trach      DVT prophylaxis:lovenox Code Status: FULL Family Communication:  plan for family meeting today  Disposition Plan / TOC needs: out of SDU and onto progressive status 10/18/21, PT/OT following, may be able to transfer to medsurg but need to know if they'll take patietn w/ trach/vent  Barriers to discharge / significant pending items: remains on tube feeds, diet advancing, remains on trach - may need LTAC, discussed his behavioral issues with him and he has been acting more professionally in the past few days.              Objective: Vitals:   10/25/21 0829 10/25/21 0900 10/25/21 1145 10/25/21 1612  BP: (!) 144/88  108/64 128/81  Pulse: 86     Resp:  17    Temp:      TempSrc:      SpO2: (!) 82%      Weight:      Height:        Intake/Output Summary (Last 24 hours) at 10/25/2021 1718 Last data filed at 10/25/2021 1613 Gross per 24 hour  Intake 970.67 ml  Output 1200 ml  Net -229.33 ml    Filed Weights   10/21/21 0400 10/22/21 0500 10/24/21 0330  Weight: (!) 193 kg (!) 143 kg (!) 166 kg    Examination:  Constitutional:  VS as above General Appearance: alert, well-developed, well-nourished, NAD Eyes: Normal lids and conjunctive, non-icteric sclera Ears, Nose, Mouth, Throat: Normal appearance Neck: No masses, trachea midline Trach in place, appears clean and ventilating well  No thyroid enlargement/tenderness/mass appreciated Respiratory: Normal respiratory effort Musculoskeletal:  No clubbing/cyanosis of digits Neurological: No cranial nerve deficit on limited exa Psychiatric: Normal judgment/insight Agitated occasionally, but able to be calmed down - he has good rapport w/ me says he believes god sent me here to help him  Has capacity to make informed medical decisions.       Scheduled Medications:   atorvastatin  10 mg Per Tube Daily   budesonide (PULMICORT) nebulizer solution  0.5 mg Nebulization BID   Chlorhexidine Gluconate Cloth  6 each Topical Daily   cholecalciferol  2,000  Units Per Tube Daily   enoxaparin (LOVENOX) injection  0.5 mg/kg Subcutaneous Q24H   epoetin (EPOGEN/PROCRIT) injection  20,000 Units Subcutaneous Weekly   escitalopram  5 mg Oral Daily   feeding supplement (NEPRO CARB STEADY)  1,000 mL Per Tube Q24H   feeding supplement (PROSource TF)  90 mL Per Tube TID   free water  200 mL Per Tube 6 X Daily   ipratropium-albuterol  3 mL Nebulization BID   lidocaine  1 patch Transdermal Q24H   multivitamin  1 tablet Per Tube QHS   nutrition supplement (JUVEN)  1 packet Per Tube BID BM   mouth rinse  15 mL Mouth Rinse 4 times per day   pantoprazole sodium  40 mg Per Tube BID   [START ON 10/26/2021] potassium chloride  20 mEq Oral Daily    traZODone  50 mg Oral QHS    Continuous Infusions:  sodium chloride Stopped (10/07/21 0542)    PRN Medications:  acetaminophen, acetaminophen, albuterol, bisacodyl, clonazepam, heparin, loperamide, nitroGLYCERIN, mouth rinse, oxyCODONE, pentafluoroprop-tetrafluoroeth, polyethylene glycol, QUEtiapine  Antimicrobials:  Anti-infectives (From admission, onward)    Start     Dose/Rate Route Frequency Ordered Stop   10/12/21 1200  cefTAZidime (FORTAZ) 2 g in sodium chloride 0.9 % 100 mL IVPB        2 g 200 mL/hr over 30 Minutes Intravenous 2 times daily 10/12/21 1122 10/17/21 0844   10/12/21 0417  ceFAZolin (ANCEF) IVPB 1 g/50 mL premix  Status:  Discontinued        1 g 100 mL/hr over 30 Minutes Intravenous 30 min pre-op 10/12/21 0417 10/16/21 0751   10/09/21 2000  ceFEPIme (MAXIPIME) 1 g in sodium chloride 0.9 % 100 mL IVPB  Status:  Discontinued        1 g 200 mL/hr over 30 Minutes Intravenous Every 24 hours 10/09/21 1404 10/09/21 1515   10/09/21 2000  cefTAZidime (FORTAZ) 1 g in sodium chloride 0.9 % 100 mL IVPB  Status:  Discontinued        1 g 200 mL/hr over 30 Minutes Intravenous Every 24 hours 10/09/21 1515 10/12/21 1122   10/06/21 1830  meropenem (MERREM) 500 mg in sodium chloride 0.9 % 100 mL IVPB  Status:  Discontinued        500 mg 200 mL/hr over 30 Minutes Intravenous Every 24 hours 10/06/21 1735 10/09/21 1403   10/06/21 1800  doxycycline (VIBRAMYCIN) 100 mg in sodium chloride 0.9 % 250 mL IVPB  Status:  Discontinued        100 mg 125 mL/hr over 120 Minutes Intravenous Every 12 hours 10/06/21 1657 10/08/21 0838   09/29/21 1000  ceFEPIme (MAXIPIME) 2 g in sodium chloride 0.9 % 100 mL IVPB        2 g 200 mL/hr over 30 Minutes Intravenous Every 12 hours 09/29/21 0812 09/30/21 2342   09/26/21 2200  ceFEPIme (MAXIPIME) 1 g in sodium chloride 0.9 % 100 mL IVPB  Status:  Discontinued        1 g 200 mL/hr over 30 Minutes Intravenous Every 24 hours 09/26/21 1154 09/29/21 0812    09/26/21 2000  ceFEPIme (MAXIPIME) 2 g in sodium chloride 0.9 % 100 mL IVPB  Status:  Discontinued        2 g 200 mL/hr over 30 Minutes Intravenous Every 12 hours 09/26/21 0809 09/26/21 1154   09/24/21 1200  ceFEPIme (MAXIPIME) 2 g in sodium chloride 0.9 % 100 mL IVPB  Status:  Discontinued  2 g 200 mL/hr over 30 Minutes Intravenous Every 8 hours 09/24/21 0950 09/26/21 0809   09/21/21 1430  azithromycin (ZITHROMAX) 500 mg in sodium chloride 0.9 % 250 mL IVPB        500 mg 250 mL/hr over 60 Minutes Intravenous Every 24 hours 09/21/21 1344 09/23/21 1543   09/21/21 1430  cefTRIAXone (ROCEPHIN) 2 g in sodium chloride 0.9 % 100 mL IVPB  Status:  Discontinued        2 g 200 mL/hr over 30 Minutes Intravenous Every 24 hours 09/21/21 1344 09/24/21 0922       Data Reviewed: I have personally reviewed following labs and imaging studies  CBC: Recent Labs  Lab 10/21/21 0736 10/22/21 1310 10/23/21 0244 10/24/21 0840 10/25/21 0344  WBC 6.6 7.9 7.6 6.6 7.8  NEUTROABS 4.0 5.3 4.7 4.0 4.7  HGB 8.6* 9.2* 8.6* 8.7* 9.1*  HCT 32.0* 34.1* 31.9* 32.0* 33.6*  MCV 84.4 83.8 82.2 82.1 82.2  PLT 224 171 192 156 147*    Basic Metabolic Panel: Recent Labs  Lab 10/21/21 0736 10/22/21 1310 10/23/21 0244 10/24/21 0840 10/25/21 0344  NA 146* 147*  147* 144 141 141  142  K 3.8 3.8  3.9 3.6 3.4* 3.3*  3.4*  CL 111 110  110 110 105 105  106  CO2 '30 31  30 31 31 31  31  '$ GLUCOSE 112* 112*  111* 107* 103* 102*  103*  BUN 53* 40*  39* 32* 24* 22  21  CREATININE 1.40* 1.13  1.12 1.15 1.03 1.11  1.05  CALCIUM 8.6* 8.7*  8.6* 8.6* 8.4* 8.3*  8.5*  MG 2.1 1.6* 1.6* 1.5* 1.7  PHOS 4.7* 2.1* 2.4* 3.4 4.1    GFR: Estimated Creatinine Clearance: 111.4 mL/min (by C-G formula based on SCr of 1.11 mg/dL). Liver Function Tests: Recent Labs  Lab 10/21/21 0736 10/22/21 1310 10/23/21 0244 10/24/21 0840 10/25/21 0344  ALBUMIN 2.4* 2.6* 2.4* 2.5* 2.6*    No results for input(s):  "LIPASE", "AMYLASE" in the last 168 hours. No results for input(s): "AMMONIA" in the last 168 hours. Coagulation Profile: No results for input(s): "INR", "PROTIME" in the last 168 hours. Cardiac Enzymes: No results for input(s): "CKTOTAL", "CKMB", "CKMBINDEX", "TROPONINI" in the last 168 hours. BNP (last 3 results) No results for input(s): "PROBNP" in the last 8760 hours. HbA1C: No results for input(s): "HGBA1C" in the last 72 hours. CBG: Recent Labs  Lab 10/24/21 2326 10/25/21 0405 10/25/21 0857 10/25/21 1159 10/25/21 1652  GLUCAP 110* 110* 105* 92 94    Lipid Profile: No results for input(s): "CHOL", "HDL", "LDLCALC", "TRIG", "CHOLHDL", "LDLDIRECT" in the last 72 hours. Thyroid Function Tests: No results for input(s): "TSH", "T4TOTAL", "FREET4", "T3FREE", "THYROIDAB" in the last 72 hours. Anemia Panel: No results for input(s): "VITAMINB12", "FOLATE", "FERRITIN", "TIBC", "IRON", "RETICCTPCT" in the last 72 hours. Urine analysis:    Component Value Date/Time   COLORURINE STRAW (A) 09/19/2021 1247   APPEARANCEUR CLEAR (A) 09/19/2021 1247   APPEARANCEUR Clear 04/15/2014 1240   LABSPEC 1.009 09/19/2021 1247   LABSPEC 1.013 04/15/2014 1240   PHURINE 5.0 09/19/2021 1247   GLUCOSEU 150 (A) 09/19/2021 1247   GLUCOSEU Negative 04/15/2014 1240   HGBUR NEGATIVE 09/19/2021 1247   BILIRUBINUR NEGATIVE 09/19/2021 1247   BILIRUBINUR Negative 04/15/2014 [place] 09/19/2021 1247   PROTEINUR NEGATIVE 09/19/2021 1247   NITRITE NEGATIVE 09/19/2021 1247   LEUKOCYTESUR NEGATIVE 09/19/2021 1247   LEUKOCYTESUR Negative 04/15/2014 1240   Sepsis  Labs: '@LABRCNTIP'$ (procalcitonin:4,lacticidven:4)  Recent Results (from the past 240 hour(s))  Gastrointestinal Panel by PCR , Stool     Status: None   Collection Time: 10/21/21  2:13 AM   Specimen: Stool  Result Value Ref Range Status   Campylobacter species NOT DETECTED NOT DETECTED Final   Plesimonas shigelloides NOT DETECTED  NOT DETECTED Final   Salmonella species NOT DETECTED NOT DETECTED Final   Yersinia enterocolitica NOT DETECTED NOT DETECTED Final   Vibrio species NOT DETECTED NOT DETECTED Final   Vibrio cholerae NOT DETECTED NOT DETECTED Final   Enteroaggregative E coli (EAEC) NOT DETECTED NOT DETECTED Final   Enteropathogenic E coli (EPEC) NOT DETECTED NOT DETECTED Final   Enterotoxigenic E coli (ETEC) NOT DETECTED NOT DETECTED Final   Shiga like toxin producing E coli (STEC) NOT DETECTED NOT DETECTED Final   Shigella/Enteroinvasive E coli (EIEC) NOT DETECTED NOT DETECTED Final   Cryptosporidium NOT DETECTED NOT DETECTED Final   Cyclospora cayetanensis NOT DETECTED NOT DETECTED Final   Entamoeba histolytica NOT DETECTED NOT DETECTED Final   Giardia lamblia NOT DETECTED NOT DETECTED Final   Adenovirus F40/41 NOT DETECTED NOT DETECTED Final   Astrovirus NOT DETECTED NOT DETECTED Final   Norovirus GI/GII NOT DETECTED NOT DETECTED Final   Rotavirus A NOT DETECTED NOT DETECTED Final   Sapovirus (I, II, IV, and V) NOT DETECTED NOT DETECTED Final    Comment: Performed at [facility], [address], [place], [place] [address]         Radiology Studies last 96 hours: No results found.          LOS: 13 days      Emeterio Reeve, DO [facility] 10/25/2021, 5:18 PM   Staff may message me via secure chat in [place]  but this may not receive immediate response,  please page for urgent matters!  If 7PM-7AM, please contact night-coverage [email or web address]  Dictation software was used to generate the above note. Typos may occur and escape review, as with typed/written notes. Please contact Dr Sheppard Coil directly for clarity if needed.

## 2021-10-25 NOTE — Progress Notes (Signed)
Physical Therapy Treatment Patient Details Name: Gary [name] MRN: [id number] DOB: 08/26/1955 Today's Date: 10/25/2021   History of Present Illness Patient was admitted secondary to SOB, COPD exacerbation, acute metabolic encephalopathy, and acute on chronic hypoxic and hypercapnic respiratory failure in setting of acute decompensated HFpEF and AECOPD requiring intubation and mechanical ventilation. The pt is now s/p tracheostomy and PEG tube placement 11/05/21.    PT Comments    Pt was pleasant and motivated to participate during the session and put forth good effort throughout. Pt able to complete supine<>sit w/ +2 modA for trunk control; able to transition legs to EOB. Moving from sit<>supine requires +2 maxA for trunk control and leg management. Pt able to complete 5 STS w/ +3 for safety but only +2 modA to initiate from elevated surface and for steadying once up. Continues to improve ability to initiate stand and upright posture but still has poor eccentric control and still only maintaining stands ~20sec. Attempted sidesteps  and mini squats but unable, however, there was observable movement in pt's LE's to attempt. Pt will benefit from PT services in a SNF setting upon discharge to safely address deficits listed in patient problem list for decreased caregiver assistance and eventual return to PLOF.    Recommendations for follow up therapy are one component of a multi-disciplinary discharge planning process, led by the attending physician.  Recommendations may be updated based on patient status, additional functional criteria and insurance authorization.  Follow Up Recommendations  Skilled nursing-short term rehab (<3 hours/day) Can patient physically be transported by private vehicle: No   Assistance Recommended at Discharge Frequent or constant Supervision/Assistance  Patient can return home with the following Two people to help with walking and/or transfers;Two people to help with  bathing/dressing/bathroom;Assistance with cooking/housework;Assist for transportation;Help with stairs or ramp for entrance   Equipment Recommendations   (TBD)    Recommendations for Other Services       Precautions / Restrictions Precautions Precautions: Fall Precaution Comments: IJ, G tube, trach Restrictions Weight Bearing Restrictions: No     Mobility  Bed Mobility Overal bed mobility: Needs Assistance Bed Mobility: Supine to Sit, Sit to Supine     Supine to sit: HOB elevated, +2 for physical assistance, Mod assist Sit to supine: Max assist, +2 for physical assistance   General bed mobility comments: modA +2 for for trunk control ; able to transition legs EOB during supine to sit    Transfers Overall transfer level: Needs assistance Equipment used: Rolling walker (2 wheels) Transfers: Sit to/from Stand Sit to Stand: From elevated surface, Mod assist, +2 physical assistance           General transfer comment: able to complete 5 STS w/ +3 for safety but +2 modA to initiate; improvement with initiating, clearance, and upright posture.    Ambulation/Gait               General Gait Details: attempted to shuffle to EOB but unable.   Stairs             Wheelchair Mobility    Modified Rankin (Stroke Patients Only)       Balance Overall balance assessment: Needs assistance Sitting-balance support: Bilateral upper extremity supported, Feet supported Sitting balance-[name] Scale: Fair     Standing balance support: Bilateral upper extremity supported, During functional activity, Reliant on assistive device for balance Standing balance-[name] Scale: Poor  Cognition Arousal/Alertness: Awake/alert Behavior During Therapy: WFL for tasks assessed/performed Overall Cognitive Status: No family/caregiver present to determine baseline cognitive functioning                                           Exercises General Exercises - Lower Extremity Ankle Circles/Pumps: Strengthening, Both, 10 reps Long Arc Quad: Strengthening, Both, 10 reps Hip Flexion/Marching: Seated, Strengthening, Both, 10 reps    General Comments        Pertinent Vitals/Pain Pain Assessment Pain Assessment: No/denies pain    Home Living                          Prior Function            PT Goals (current goals can now be found in the care plan section) Progress towards PT goals: Progressing toward goals    Frequency    Min 2X/week      PT Plan Current plan remains appropriate    Co-evaluation              AM-PAC PT "6 Clicks" Mobility   Outcome Measure  Help needed turning from your back to your side while in a flat bed without using bedrails?: A Lot Help needed moving from lying on your back to sitting on the side of a flat bed without using bedrails?: A Lot Help needed moving to and from a bed to a chair (including a wheelchair)?: A Lot Help needed standing up from a chair using your arms (e.g., wheelchair or bedside chair)?: A Lot Help needed to walk in hospital room?: Total Help needed climbing 3-5 steps with a railing? : Total 6 Click Score: 10    End of Session Equipment Utilized During Treatment: Gait belt Activity Tolerance: Patient tolerated treatment well Patient left: in bed;with call bell/phone within reach;with nursing/sitter in room Nurse Communication: Mobility status PT Visit Diagnosis: Unsteadiness on feet (R26.81);Muscle weakness (generalized) (M62.81);Difficulty in walking, not elsewhere classified (R26.2)     Time: 1791-5056 PT Time Calculation (min) (ACUTE ONLY): 32 min  Charges:                        Turner Daniels, SPT  10/25/2021, 2:07 PM

## 2021-10-25 NOTE — Progress Notes (Signed)
Calorie Count Day 1  Estimated Nutritional Needs:    Kcal:  2800-3100kcal/day Protein:  >140g/day Fluid:  2.0L/day    Lunch 7/11: 25% beef pot roast, corn and mashed potatoes, 25% chilled peaches, 100% unsweet tea  Total- 107kcal and 5g protein   Dinner 7/11: 50% chicken salad sandwich, 50% chilled peaches and pears, 100% sweet tea  Total- 283kcal and 12g protein   Breakfast 7/12: refused meal   Total- 0  Supplements: refuses Nepro supplements- will discontinue and add Magic Cups to meal trays   Total Intake:  390kcal (14% estimated needs) and  17g protein (12% estimated needs)  Koleen Distance MS, RD, LDN Please refer to [facility] for RD and/or RD on-call/weekend/after hours pager

## 2021-10-25 NOTE — Plan of Care (Signed)
?  Problem: Cardiac: ?Goal: Ability to achieve and maintain adequate cardiopulmonary perfusion will improve ?Outcome: Progressing ?  ?Problem: Activity: ?Goal: Capacity to carry out activities will improve ?Outcome: Progressing ?  ?

## 2021-10-25 NOTE — TOC Progression Note (Addendum)
Transition of Care [facility]) - Progression Note    Patient Details  Name: Gary [name] MRN: [id number] Date of Birth: 19-Sep-1955  Transition of Care [facility]) CM/SW [place], [place] Phone Number: 10/25/2021, 1:36 PM  Clinical Narrative:     CSW has lvm with Melissa with [facility] at [phone] to inquire as to referral.   Patient has no bed offers. Patient is off vent, only on trach.  Patient's insurance does not cover LTAC. CSW has re sent referrals to SNF to attempt to get bed offers.   CSW notes that patient barriers to placement includes his behaviors, his insurance, and his high care needs as well as bariatric needs.   Patient is on Difficult to Place List.   Expected Discharge Plan: Long Term Acute Care (LTAC) Barriers to Discharge: Continued Medical Work up  Expected Discharge Plan and Services Expected Discharge Plan: Long Term Acute Care (LTAC)   Discharge Planning Services: CM Consult Post Acute Care Choice: Long Term Acute Care (LTAC) Living arrangements for the past 2 months: Single Family Home                 DME Arranged: N/A DME Agency: NA       HH Arranged: NA HH Agency: NA         Social Determinants of Health (SDOH) Interventions    Readmission Risk Interventions    05/04/2021    1:26 PM 04/07/2021    1:39 PM 03/12/2021   11:54 AM  Readmission Risk Prevention Plan  Transportation Screening Complete Complete Complete  PCP or Specialist Appt within 3-5 Days  Complete Complete  HRI or [place]   Complete  Social Work Consult for [place] Planning/Counseling   Complete  Palliative Care Screening  Not Applicable Not Applicable  Medication Review Press photographer) Complete Complete Complete  PCP or Specialist appointment within 3-5 days of discharge Complete    HRI or [place] Complete    SW Recovery Care/Counseling Consult Complete    [place] Not  Applicable

## 2021-10-26 DIAGNOSIS — Z7189 Other specified counseling: Secondary | ICD-10-CM | POA: Diagnosis not present

## 2021-10-26 LAB — CBC WITH DIFFERENTIAL/PLATELET
Abs Immature Granulocytes: 0.05 10*3/uL (ref 0.00–0.07)
Basophils Absolute: 0 10*3/uL (ref 0.0–0.1)
Basophils Relative: 1 %
Eosinophils Absolute: 0.1 10*3/uL (ref 0.0–0.5)
Eosinophils Relative: 1 %
HCT: 34.2 % — ABNORMAL LOW (ref 39.0–52.0)
Hemoglobin: 9.2 g/dL — ABNORMAL LOW (ref 13.0–17.0)
Immature Granulocytes: 1 %
Lymphocytes Relative: 19 %
Lymphs Abs: 1.6 10*3/uL (ref 0.7–4.0)
MCH: 22.7 pg — ABNORMAL LOW (ref 26.0–34.0)
MCHC: 26.9 g/dL — ABNORMAL LOW (ref 30.0–36.0)
MCV: 84.2 fL (ref 80.0–100.0)
Monocytes Absolute: 0.9 10*3/uL (ref 0.1–1.0)
Monocytes Relative: 10 %
Neutro Abs: 6 10*3/uL (ref 1.7–7.7)
Neutrophils Relative %: 68 %
Platelets: 137 10*3/uL — ABNORMAL LOW (ref 150–400)
RBC: 4.06 MIL/uL — ABNORMAL LOW (ref 4.22–5.81)
RDW: 17.4 % — ABNORMAL HIGH (ref 11.5–15.5)
WBC: 8.8 10*3/uL (ref 4.0–10.5)
nRBC: 0.7 % — ABNORMAL HIGH (ref 0.0–0.2)

## 2021-10-26 LAB — RENAL FUNCTION PANEL
Albumin: 2.6 g/dL — ABNORMAL LOW (ref 3.5–5.0)
Anion gap: 5 (ref 5–15)
BUN: 26 mg/dL — ABNORMAL HIGH (ref 8–23)
CO2: 32 mmol/L (ref 22–32)
Calcium: 8.2 mg/dL — ABNORMAL LOW (ref 8.9–10.3)
Chloride: 103 mmol/L (ref 98–111)
Creatinine, Ser: 1.26 mg/dL — ABNORMAL HIGH (ref 0.61–1.24)
GFR, Estimated: >60 mL/min (ref >60)
Glucose, Bld: 103 mg/dL — ABNORMAL HIGH (ref 70–99)
Phosphorus: 4.7 mg/dL — ABNORMAL HIGH (ref 2.5–4.6)
Potassium: 3.8 mmol/L (ref 3.5–5.1)
Sodium: 140 mmol/L (ref 135–145)

## 2021-10-26 LAB — GLUCOSE, CAPILLARY
Glucose-Capillary: 113 mg/dL — ABNORMAL HIGH (ref 70–99)
Glucose-Capillary: 96 mg/dL (ref 70–99)
Glucose-Capillary: 94 mg/dL (ref 70–99)
Glucose-Capillary: 83 mg/dL (ref 70–99)

## 2021-10-26 LAB — MAGNESIUM: Magnesium: 1.8 mg/dL (ref 1.7–2.4)

## 2021-10-26 NOTE — Progress Notes (Signed)
Speech Language Pathology Treatment:    Patient Details Name: Gary [name] MRN: [id number] DOB: 1955/08/02 Today's Date: 10/26/2021 Time: 9833-8250 SLP Time Calculation (min) (ACUTE ONLY): 15 min  Assessment / Plan / Recommendation Clinical Impression  Pt seen for diet tolerance. Pt being fed by NT with PMV donned upon SLP entrance to room. Pt on 28% FiO2 via trach collar. Encouragement for appropriate position of trach collar over trach/PMV. Pt stating, "it's too tight."  Pt observed with trials of regular/mech soft solids, pureed textures, and thin liquids via straw. Items from meal tray included, but not limited to, scrambled eggs, toast, sausage, and coffee (via straw). Seldom, intermittent dry cough appreciated which did not appear related to POs and appeared to improve with proper positioning of trach collar. Pt with mildly prolonged mastication of regular solids and benefited from well cut meats. Pt declined self feeding despite encouragement from SLP and NT.   Per chart review, temp and WBC WNL. No recent chest imaging.   Pt remains at increased risk for aspiration/aspiration PNA given mental status, need for assistance with feeding, prolonged intubation/tracheostomy/overall respiratory status.  Recommend continuation of current diet with chopped meats, extra gravies/sauces to moisten food. Safe swallowing strategies including, but not limited to, PMV donned for POs, upright positioning for PO (ideally, OOB), single bites/sips, alternated bites sips, and reduced distractions/talking with POs. Pt would also benefit from repositioning assistance and feeding support (with continued encouragement for self feeding).   Recommend continued RD f/u given PO intake with supplemental tubefeeds via PEG.   SLP to sign off as pt has no acute SLP needs at this time. Further SLP needs including dysphagia management and cognitive-linguistic evaluation in functional/home setting (as indicated) may be  completed at next level of care.   Pt, NT, and RN made aware of results, recommendations, and SLP POC. ?full understanding by pt. Signage in pt's room was updated for reinforcement of content by medical team.    HPI HPI: Per H&P "66 y/o male with multiple medical problems causing chronic hypercapneic respiratory failure who has had admissions in the past for exacerbations of the same and mechanical ventilation presented for evaluation of dyspnea, weakness. It is unclear if the patient has been compliant with his medications and NIMV at home.  He was admitted by the hospitalists for a presumed COPD exacerbation in the ER, treated with bronchodilators and NIMV but his mental status and hypercarbia worsened despite those interventions so he required intubation.  PCCM consulted for admission.  He could not provide history for [place] because he was encephalopathic.".   Pt intubated 09/19/21, tracheostomy placed 10/03/21, and PEG placed 10/13/21.    PMH includes: diastolic CHF (EF 60 to 53%, grade III DD, 02/2021), COPD on home O2 2 L, HTN, OSA on CPAP, PVD, hyperlipidemia, former smoker, Morbid Obesity with a BMI 49.65, GI bleeding, CKD-3, iron deficiency anemia, who presents with shortness of breath.   Patient has had Multiple hospitalizations in recent months.      SLP Plan   (SLP services at next level; no acute SLP needs at this time)      Recommendations for follow up therapy are one component of a multi-disciplinary discharge planning process, led by the attending physician.  Recommendations may be updated based on patient status, additional functional criteria and insurance authorization.    Recommendations  Diet recommendations: Regular;Thin liquid (cut meats) Liquids provided via: Cup;Straw Medication Administration: Whole meds with puree Supervision: Staff to assist with self feeding;Intermittent supervision to  cue for compensatory strategies (encourage pt to feed himself) Compensations: Minimize  environmental distractions;Slow rate;Small sips/bites;Follow solids with liquid Postural Changes and/or Swallow Maneuvers: Out of bed for meals;Seated upright 90 degrees;Upright 30-60 min after meal      Patient may use Passy-Muir Speech Valve: During all therapies with supervision;During all waking hours (remove during sleep);During PO intake/meals PMSV Supervision: Intermittent MD: Please consider changing trach tube to :  (TBD)         General recommendations:  (continued RD f/u) Oral Care Recommendations: Oral care BID;Oral care before and after PO;Staff/trained caregiver to provide oral care Follow Up Recommendations: Follow physician's recommendations for discharge plan and follow up therapies SLP Visit Diagnosis: Dysphagia, unspecified (R13.10) Plan:  (SLP services at next level; no acute SLP needs at this time)          Cherrie Gauze, M.S., [facility] [phone] [place])   Quintella Baton  10/26/2021, 11:49 AM

## 2021-10-26 NOTE — Progress Notes (Signed)
Calorie Count Day 2  Estimated Nutritional Needs:    Kcal:  2800-3100kcal/day Protein:  >140g/day Fluid:  2.0L/day    Dinner 7/12: 20% roasted [place], mashed potatoes, beans, 100% unsweet tea   Total- 70kcal and 4g protein   Breakfast 7/12: 100% [place] sausage and eggs, OJ  Total- 250kcal and 18g protein   Lunch 7/12: 100% cheeseburger, 100% sweet tea  Total- 448kcal and 28g protein   Supplements: pt did not consume Magic Cup supplements   Total Intake:  768kcal (27% estimated needs) and 50g protein (35% estimated needs)  Koleen Distance MS, RD, LDN Please refer to [facility] for RD and/or RD on-call/weekend/after hours pager

## 2021-10-26 NOTE — Progress Notes (Signed)
PROGRESS NOTE    Gary [name]  PIR:[id number] DOB: 1955-09-12  DOA: 09/19/2021 Date of Service: 10/26/21 PCP: [facility], [facility] [place] Course:  66 y/o male with multiple medical problems causing chronic hypercapneic respiratory failure who has had admissions in the past for exacerbations of the same and mechanical ventilation presented for evaluation of dyspnea, weakness. It is unclear if the patient has been compliant with his medications and NIMV at home.  He was admitted by the hospitalists for a presumed COPD exacerbation in the ER, treated with bronchodilators and NIMV but his mental status and hypercarbia worsened despite those interventions so he required intubation.   6/6: Presented to ED.  Required intubation and mechanical ventilation in the ED.  PCCM admit ICU 6/7: Hold diuresis due to worsening Creatinine.  ENT consulted for Trach placement per family request 6/8: Pt continues to spike temps tmax 101.8 degrees F.  Flexiseal placed overnight draining foul smelling watery stool~Cdiff and GI panel pending  6/9: Fever resolved.  Negative for C- diff, GI panel still pending.  Creatinine slightly worsened, requiring low dose levophed (2 mcg). Holding diuresis 6/10: propofol discontinued due hypertriglyceridemia; failed SBT 6/11: Tracheal aspirate from 6/6 resulting with Pseudomonas, ABX changed to Cefepime. neurologically intact, awaiting Trach placement 6/12: Pt calmer today following addition of oral benzo's, narcotics, and Seroquel via tube.  Creatinine worsened with decreased UOP, holding diuresis, low threshold for Nephrology consult  6/13: Pt with worsening renal failure creatinine 4.15.  UOP overnight 25 ml.  Nephrology consulted plans to start pt on hemodialysis follow dialysis catheter placement Pt underwent hemodialysis without ultrafiltration 06/14: .  Plans for HD session today.  Agitation has improved significantly with narcotics and antianxiety medications  per tube   06/16: Tracheostomy canceled 06/15 due to hyperkalemia and tentatively rescheduled for 06/20  6/18 failure to wean from vent 06/20: ENT placed Tracheostomy (#7.0 XLT cuffed).  Developed mild epistaxis post unsuccessful attempt at NGT placement.  Later developed hematochezia/melena, Protonix gtt started and GI consulted. 06/21: Diuresed yesterday with limited results. Patient had softed blood pressures throughout the day and diuresis was held as a result. HD was performed by the patient clotted off the machine and was not able to be rinsed back. Repeat hemoglobin is above 8. 06/23: Pt remains mechanically intubated settings: FiO2 50%/PEEP 10.  CXR concerning for moderate right pleural effusion with associated right basilar atelectasis vs. infiltrate.  Will perform [place] Chest to determine if pt needs right sided thoracentesis  06/24: Remains on the ventilator, FiO2 requirements decreasing, respiratory culture with abundant gram-negative rods: [name], on Meropenem 06/25: Oxygen requirements down to 40%, chest x-ray shows improvement on dense right consolidation, Pseudomonas susceptibilities pending 06/25: Pt remains on the ventilator settings: PEEP 10 and FiO2 40%; will attempt to wean PEEP to 5 to perform SBT  06/28: Pt tolerated PS on 06/27.  Overnight pt had episodes of vomiting TF's suspect secondary to malposition of nasogastric tube.  Will consult IR for PEG tube placement if unable to place PEG tube pt will need postpyloric Dobbhoff   06/29: Pt tolerated PS 5/5 for over an hour yesterday, however due to fatigue placed back on PRVC.  Pt pulled NGT out several times overnight, therefore will not attempt to reinsert.  Plans for PEG tube placement per IR on 06/30.   06/30: PEG tube placed per IR  07/1: Pt tolerated trach collar '@40'$ % throughout the day 07/2: Pt placed on ventilator overnight settings 16/5/35% tolerating well.  Will place pt on trach collar today and place orders  for PT/OT 7/3: Hemodynamically stable.  Tolerating TC (has remained on TC since 7/2 @ 08:00), plan for speech therapy and PT/OT 7/4: Has maintained on trach collar for greater than 48 hours, tolerating Passy-Muir valve.  Plan to transfer out of ICU. SLP recs: dysphagia 1 (puree) w/ nectar liquid.  7/5-7/6: sodium and creatinine trending appropriately, Hgb stable. Low K repleted and stable. Was off vent qhs (orders d/c or missed?) and was restarted. Will need LTAC, TOC following  7/7: pulled out trach overnight. Confusion re: vent plan among staff and myself. I asked PCCM to reevluate him and verify plan so the team and the patient can eb on the same page, and will help TOC arrange appropriate placement.  7/8: I spoke personally w/ Dr Gary [name] Gary [name]) and he confirms patient does NOT need ventilator support, unless of course develops distress, but last night the patient tolerated well without it. Plan to meet w/ family today when they visit, I spoke w/ his sister yesterday on the phone. No concerns on CBC/BMP: Cr tending down a bit, Hgb stable, Na slightly high but improving over past few days. He is approaching medically stable and may be appropriate for LTAC vs SNF soon  7/9: Several long discussions with patient and family about medical condition, behavioral issues.  See separate progress notes.  Psychiatry evaluated, agree that he has capacity, started SSRI. 7/10: Patient remains medically stable, awaiting placement.  Discussed his behavioral issues again and encouraged him to always bring any concerns to nursing staff or to me calmly and I am happy to listen, requested he maintain professionalism towards staff, he seems amenable. 7/11-7/13: goal OOB to chair, needing hoyer lift, pt reeducated several times about staying in bed 07/11 and has been copliant through 07/12. Finally got bariatric chair and hoyer lift 7/12 and plan OOB 07/13. Awaiting placement. D/c monitoring and sitter and transferred to Gary [name]  floor    Consultants:  PCCU Nephrology ENT GI IR Psychiatry  Procedures: 10/03/21: tracheostomy placement 10/12/21: dialysis permacath placement  10/13/21: G-tube placed    Subjective: Patient today again reports frustration about being stuck in bed, would like to get up and move around.  He promises that he is not going to try to get up out of bed without assistance and is asking that the sitter be removed.  Spoke w/ RN, he has not been trying to get out of bed without assistance over the past couple of days.     ASSESSMENT & PLAN:   Principal Problem:   Acute on chronic respiratory failure with hypoxia and hypercapnia ([name]) Active Problems:   Major depressive disorder, recurrent episode, moderate ([name])   Acute on chronic diastolic congestive heart failure ([name])   COPD with acute exacerbation ([name])   Acute metabolic encephalopathy   HTN (hypertension)   Obesity, Class III, BMI 40-49.9 (morbid obesity) ([place])   Obstructive sleep apnea   Chronic kidney disease, stage 3a ([name])   HLD (hyperlipidemia)   Pressure injury of skin   No problem-specific Assessment & Plan notes found for this encounter.  Acute on Chronic Hypoxic & Hypercapnic Respiratory Failure in the setting of acute decompensated HFpEF, AECOPD, Pseudomonas Pneumonia, & OSA/OHS Moderate right sided pleural effusion -ruled out, actually lung CONSOLIDATION -S/p  tracheostomy by ENT by 6/20 -Trach collar w/ ventilator support as needed / qhs but he does NOT need vent supoort overnight routinely per my conversation w/ Dr Gary [name] 07/08 -Completed  10 day course of ceftazidime for treatment  of Pseudomonas -At this point, he is medically stable but placement is going to be an issue for him.  I have discussed that his behavioral issues are impeding placement and his behavior has improved.    Acute decompensated HFpEF PMHx: Hypertension  Echocardiogram 08/07/2021: LVEF 60 to 16%, grade 1 diastolic dysfunction, normal  RV systolic function -Continuous cardiac monitoring -Maintain MAP >65 -Was on intermittent hemodialysis for volume removal - has not needed HD and has remained compensated  -cancelled permacath placement    AKI superimposed CKD stage IIIa  Hypokalemia  Hypernatremia During admissions in February 2023, creatinine 1.8 with GFR 40; in May 2023 creatinine 1.1 with GFR of 60 -Monitor I&O's / urinary output -Follow BMP  -Cr down, good UOP noted, d/c dialysis catheter at this time, monitor renal parameters daily. 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 Diff negative   Acute Blood Loss Anemia~RESOLVED Nasopharyngeal trauma vs Upper GI bleed -Trend CBC  -Monitor for s/sx of bleeding and transfuse for Hgb <7 -Continue BID IV PPI -per GI, no need for scope  -nephrology recommends consider epogen as outpatient    Hyperglycemia ~STABLE -CBG's achs -SSI  Acute Metabolic Encephalopathy in setting of CO2 Narcosis Sedation needs in setting of mechanical ventilation~RESOLVED  CT Head 09/19/21: negative;  UDS negative -Treatment of hypercapnia as outlined above -Maintain a RASS goal of 0  -Continue oxycodone and klonopin prn due to agitation/delirium but limit these if possible  Trach in place Gtube in place Was Requiring ventilatory support at night, has done well without this Working on increasing po intake, SLP and dietary following  Has not required ventilatory support, doing well on O2 via the trach      DVT prophylaxis:lovenox Code Status: FULL Family Communication:  none today Disposition Plan / TOC needs: to med-surg today 07/13 Barriers to discharge / significant pending items: remains on tube feeds, diet advancing, remains on trach - may need LTAC, discussed his  behavioral issues with him and he has been acting more professionally toward staff in the past several days.              Objective: Vitals:   10/26/21 0038 10/26/21 0137 10/26/21 0331 10/26/21 0700  BP:   125/80 (!) 124/92  Pulse:   89 86  Resp:   19 17  Temp:   98.4 F (36.9 C)   TempSrc:   Oral   SpO2: 95% 94% 98%   Weight:   123.4 kg   Height:        Intake/Output Summary (Last 24 hours) at 10/26/2021 0729 Last data filed at 10/26/2021 0700 Gross per 24 hour  Intake 3640 ml  Output 850 ml  Net 2790 ml    Filed Weights   10/22/21 0500 10/24/21 0330 10/26/21 0331  Weight: (!) 143 kg (!) 166 kg 123.4 kg    Examination:  Constitutional:  VS as above General Appearance: alert, well-developed, well-nourished, NAD Eyes: Normal lids and conjunctive, non-icteric sclera Ears, Nose, Mouth, Throat: Normal appearance Neck: No masses, trachea midline Trach in place, appears clean and ventilating well  Respiratory: Normal respiratory effort CTABL Cardiovascular: S1S2, RRR Musculoskeletal:  No clubbing/cyanosis of digits Neurological: No cranial nerve deficit on limited exa Psychiatric: Normal judgment/insight Calm lately Has capacity to make informed medical decisions.  Scheduled Medications:   atorvastatin  10 mg Per Tube Daily   budesonide (PULMICORT) nebulizer solution  0.5 mg Nebulization BID   Chlorhexidine Gluconate Cloth  6 each Topical Daily   cholecalciferol  2,000 Units Per Tube Daily   enoxaparin (LOVENOX) injection  0.5 mg/kg Subcutaneous Q24H   epoetin (EPOGEN/PROCRIT) injection  20,000 Units Subcutaneous Weekly   escitalopram  5 mg Oral Daily   feeding supplement (NEPRO CARB STEADY)  1,000 mL Per Tube Q24H   feeding supplement (PROSource TF)  90 mL Per Tube TID   free water  200 mL Per Tube 6 X Daily   ipratropium-albuterol  3 mL Nebulization BID   lidocaine  1 patch Transdermal Q24H   multivitamin  1 tablet Per Tube QHS    nutrition supplement (JUVEN)  1 packet Per Tube BID BM   mouth rinse  15 mL Mouth Rinse 4 times per day   pantoprazole sodium  40 mg Per Tube BID   potassium chloride  20 mEq Oral Daily   traZODone  50 mg Oral QHS    Continuous Infusions:  sodium chloride Stopped (10/07/21 0542)    PRN Medications:  acetaminophen, acetaminophen, albuterol, bisacodyl, clonazepam, heparin, loperamide, nitroGLYCERIN, mouth rinse, oxyCODONE, pentafluoroprop-tetrafluoroeth, polyethylene glycol, QUEtiapine  Antimicrobials:  Anti-infectives (From admission, onward)    Start     Dose/Rate Route Frequency Ordered Stop   10/12/21 1200  cefTAZidime (FORTAZ) 2 g in sodium chloride 0.9 % 100 mL IVPB        2 g 200 mL/hr over 30 Minutes Intravenous 2 times daily 10/12/21 1122 10/17/21 0844   10/12/21 0417  ceFAZolin (ANCEF) IVPB 1 g/50 mL premix  Status:  Discontinued        1 g 100 mL/hr over 30 Minutes Intravenous 30 min pre-op 10/12/21 0417 10/16/21 0751   10/09/21 2000  ceFEPIme (MAXIPIME) 1 g in sodium chloride 0.9 % 100 mL IVPB  Status:  Discontinued        1 g 200 mL/hr over 30 Minutes Intravenous Every 24 hours 10/09/21 1404 10/09/21 1515   10/09/21 2000  cefTAZidime (FORTAZ) 1 g in sodium chloride 0.9 % 100 mL IVPB  Status:  Discontinued        1 g 200 mL/hr over 30 Minutes Intravenous Every 24 hours 10/09/21 1515 10/12/21 1122   10/06/21 1830  meropenem (MERREM) 500 mg in sodium chloride 0.9 % 100 mL IVPB  Status:  Discontinued        500 mg 200 mL/hr over 30 Minutes Intravenous Every 24 hours 10/06/21 1735 10/09/21 1403   10/06/21 1800  doxycycline (VIBRAMYCIN) 100 mg in sodium chloride 0.9 % 250 mL IVPB  Status:  Discontinued        100 mg 125 mL/hr over 120 Minutes Intravenous Every 12 hours 10/06/21 1657 10/08/21 0838   09/29/21 1000  ceFEPIme (MAXIPIME) 2 g in sodium chloride 0.9 % 100 mL IVPB        2 g 200 mL/hr over 30 Minutes Intravenous Every 12 hours 09/29/21 0812 09/30/21 2342    09/26/21 2200  ceFEPIme (MAXIPIME) 1 g in sodium chloride 0.9 % 100 mL IVPB  Status:  Discontinued        1 g 200 mL/hr over 30 Minutes Intravenous Every 24 hours 09/26/21 1154 09/29/21 0812   09/26/21 2000  ceFEPIme (MAXIPIME) 2 g in sodium chloride 0.9 % 100 mL IVPB  Status:  Discontinued        2 g 200 mL/hr  over 30 Minutes Intravenous Every 12 hours 09/26/21 0809 09/26/21 1154   09/24/21 1200  ceFEPIme (MAXIPIME) 2 g in sodium chloride 0.9 % 100 mL IVPB  Status:  Discontinued        2 g 200 mL/hr over 30 Minutes Intravenous Every 8 hours 09/24/21 0950 09/26/21 0809   09/21/21 1430  azithromycin (ZITHROMAX) 500 mg in sodium chloride 0.9 % 250 mL IVPB        500 mg 250 mL/hr over 60 Minutes Intravenous Every 24 hours 09/21/21 1344 09/23/21 1543   09/21/21 1430  cefTRIAXone (ROCEPHIN) 2 g in sodium chloride 0.9 % 100 mL IVPB  Status:  Discontinued        2 g 200 mL/hr over 30 Minutes Intravenous Every 24 hours 09/21/21 1344 09/24/21 0922       Data Reviewed: I have personally reviewed following labs and imaging studies  CBC: Recent Labs  Lab 10/22/21 1310 10/23/21 0244 10/24/21 0840 10/25/21 0344 10/26/21 0632  WBC 7.9 7.6 6.6 7.8 8.8  NEUTROABS 5.3 4.7 4.0 4.7 6.0  HGB 9.2* 8.6* 8.7* 9.1* 9.2*  HCT 34.1* 31.9* 32.0* 33.6* 34.2*  MCV 83.8 82.2 82.1 82.2 84.2  PLT 171 192 156 147* 137*    Basic Metabolic Panel: Recent Labs  Lab 10/21/21 0736 10/22/21 1310 10/23/21 0244 10/24/21 0840 10/25/21 0344 10/26/21 0632  NA 146* 147*  147* 144 141 141  142 140  K 3.8 3.8  3.9 3.6 3.4* 3.3*  3.4* 3.8  CL 111 110  110 110 105 105  106 103  CO2 '30 31  30 31 31 31  31 '$ 32  GLUCOSE 112* 112*  111* 107* 103* 102*  103* 103*  BUN 53* 40*  39* 32* 24* 22  21 26*  CREATININE 1.40* 1.13  1.12 1.15 1.03 1.11  1.05 1.26*  CALCIUM 8.6* 8.7*  8.6* 8.6* 8.4* 8.3*  8.5* 8.2*  MG 2.1 1.6* 1.6* 1.5* 1.7  --   PHOS 4.7* 2.1* 2.4* 3.4 4.1 4.7*    GFR: Estimated Creatinine  Clearance: 83.9 mL/min (A) (by C-G formula based on SCr of 1.26 mg/dL (H)). Liver Function Tests: Recent Labs  Lab 10/22/21 1310 10/23/21 0244 10/24/21 0840 10/25/21 0344 10/26/21 2683  ALBUMIN 2.6* 2.4* 2.5* 2.6* 2.6*    No results for input(s): "LIPASE", "AMYLASE" in the last 168 hours. No results for input(s): "AMMONIA" in the last 168 hours. Coagulation Profile: No results for input(s): "INR", "PROTIME" in the last 168 hours. Cardiac Enzymes: No results for input(s): "CKTOTAL", "CKMB", "CKMBINDEX", "TROPONINI" in the last 168 hours. BNP (last 3 results) No results for input(s): "PROBNP" in the last 8760 hours. HbA1C: No results for input(s): "HGBA1C" in the last 72 hours. CBG: Recent Labs  Lab 10/25/21 0857 10/25/21 1159 10/25/21 1652 10/25/21 1956 10/26/21 0716  GLUCAP 105* 92 94 94 113*    Lipid Profile: No results for input(s): "CHOL", "HDL", "LDLCALC", "TRIG", "CHOLHDL", "LDLDIRECT" in the last 72 hours. Thyroid Function Tests: No results for input(s): "TSH", "T4TOTAL", "FREET4", "T3FREE", "THYROIDAB" in the last 72 hours. Anemia Panel: No results for input(s): "VITAMINB12", "FOLATE", "FERRITIN", "TIBC", "IRON", "RETICCTPCT" in the last 72 hours. Urine analysis:    Component Value Date/Time   COLORURINE STRAW (A) 09/19/2021 1247   APPEARANCEUR CLEAR (A) 09/19/2021 1247   APPEARANCEUR Clear 04/15/2014 1240   LABSPEC 1.009 09/19/2021 1247   LABSPEC 1.013 04/15/2014 1240   PHURINE 5.0 09/19/2021 1247   GLUCOSEU 150 (A) 09/19/2021 1247  GLUCOSEU Negative 04/15/2014 1240   HGBUR NEGATIVE 09/19/2021 1247   BILIRUBINUR NEGATIVE 09/19/2021 1247   BILIRUBINUR Negative 04/15/2014 [place] 09/19/2021 1247   PROTEINUR NEGATIVE 09/19/2021 1247   NITRITE NEGATIVE 09/19/2021 1247   LEUKOCYTESUR NEGATIVE 09/19/2021 1247   LEUKOCYTESUR Negative 04/15/2014 1240   Sepsis Labs: '@LABRCNTIP'$ (procalcitonin:4,lacticidven:4)  Recent Results (from the past  240 hour(s))  Gastrointestinal Panel by PCR , Stool     Status: None   Collection Time: 10/21/21  2:13 AM   Specimen: Stool  Result Value Ref Range Status   Campylobacter species NOT DETECTED NOT DETECTED Final   Plesimonas shigelloides NOT DETECTED NOT DETECTED Final   Salmonella species NOT DETECTED NOT DETECTED Final   Yersinia enterocolitica NOT DETECTED NOT DETECTED Final   Vibrio species NOT DETECTED NOT DETECTED Final   Vibrio cholerae NOT DETECTED NOT DETECTED Final   Enteroaggregative E coli (EAEC) NOT DETECTED NOT DETECTED Final   Enteropathogenic E coli (EPEC) NOT DETECTED NOT DETECTED Final   Enterotoxigenic E coli (ETEC) NOT DETECTED NOT DETECTED Final   Shiga like toxin producing E coli (STEC) NOT DETECTED NOT DETECTED Final   Shigella/Enteroinvasive E coli (EIEC) NOT DETECTED NOT DETECTED Final   Cryptosporidium NOT DETECTED NOT DETECTED Final   Cyclospora cayetanensis NOT DETECTED NOT DETECTED Final   Entamoeba histolytica NOT DETECTED NOT DETECTED Final   Giardia lamblia NOT DETECTED NOT DETECTED Final   Adenovirus F40/41 NOT DETECTED NOT DETECTED Final   Astrovirus NOT DETECTED NOT DETECTED Final   Norovirus GI/GII NOT DETECTED NOT DETECTED Final   Rotavirus A NOT DETECTED NOT DETECTED Final   Sapovirus (I, II, IV, and V) NOT DETECTED NOT DETECTED Final    Comment: Performed at [facility], [address] [place], [place] [address]         Radiology Studies last 96 hours: No results found.          LOS: 32 days      Emeterio Reeve, DO [facility] 10/26/2021, 7:29 AM   Staff may message me via secure chat in [place]  but this may not receive immediate response,  please page for urgent matters!  If 7PM-7AM, please contact night-coverage [email or web address]  Dictation software was used to generate the above note. Typos may occur and escape review, as with typed/written notes. Please contact Dr Sheppard Coil directly for clarity if  needed.

## 2021-10-26 NOTE — Progress Notes (Signed)
Nutrition Follow Up Note   DOCUMENTATION CODES:   Morbid obesity  INTERVENTION:   Continue nocturnal feeds of Nepro '@70ml' /hr x 14 hrs overnight (run from 1800-0800)  Pro-Source TF 63m TID via tube  Free water flushes 205m 6 times daily via tube   Regimen provides 2004kcal/day (meets 72% estimated needs), 145g/day protein (meets 100% of pt's estimated needs) and 191284may of free water.   Cholecalciferol 2000 units daily via tube   Juven Fruit Punch BID via tube, each serving provides 95kcal and 2.5g of protein (amino acids glutamine and arginine)  Discontinue Rena-vit  NUTRITION DIAGNOSIS:   Inadequate oral intake related to inability to eat (pt sedated and ventilated) as evidenced by NPO status. -resolving  GOAL:   Patient will meet greater than or equal to 90% of their needs -met with tube feeds   MONITOR:   PO intake, Supplement acceptance, Labs, Weight trends, TF tolerance, Skin, I & O's  ASSESSMENT:   [age] male with h/o PUD, OSA, HTN, CKD III, substance abuse, CHF, Hep C and COPD and COVID 19 (02/2021) who is admitted with COPD exacerbation, CHF and AKI. 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  -Pt s/p tracheostomy 6/20 -Pt s/p 56F IR G-tube 6/30  Met with pt in room today. Pt sitting up in chair today and seems to be in a fairly good mood. Pt reports that he feels he is eating better. Pt is able to recall what he ate for breakfast and lunch today. Pt hates the Nepro supplements so these were discontinued and Magic Cups added. Pt reports that he wanted to eat his Magic Cup today but that it had melted before he got to eat it. Forty eight hour calorie count completed, pt is meeting <35% of his estimated needs via oral intake. Will resume nocturnal tube feeds to meet ~70% of pt's estimated needs. RD will re-evaluate next week to determine if tube feeds can be further decreased. No more dialysis indicated at this time; RD will discontinue  rena-vite.   Medications reviewed and include: vitamin D, lovenox, epoetin, juven, protonix, KCl   Labs reviewed: K 3.8 wnl, P 4.7(H), Mg 1.8 wnl Hgb 9.2(L), Hct 34.2(L), MCH 22.7(L), MCHC 26.9(L) Cbgs- 94, 96, 113 x 24 hrs  Diet Order:    Diet Order             Diet 2 gram sodium Room service appropriate? Yes with Assist; Fluid consistency: Thin  Diet effective now                  EDUCATION NEEDS:   No education needs have been identified at this time  Skin:  Skin Assessment: Skin Integrity Issues: Skin Integrity Issues:: Incisions, Stage II, Other (Comment) Stage II: medial perineum Incisions: closed neck s/p trach Other: MASD to rt groin  Last BM:  7/13- type 6  Height:   Ht Readings from Last 1 Encounters:  09/28/21 '6\' 3"'  (1.905 m)    Weight:   Wt Readings from Last 1 Encounters:  10/26/21 123.4 kg    Ideal Body Weight:  89 kg  BMI:  Body mass index is 34 kg/m.  Estimated Nutritional Needs:   Kcal:  2800-3100kcal/day  Protein:  >140g/day  Fluid:  2.0L/day  CasKoleen Distance, RD, LDN Please refer to [facility] RD and/or RD on-call/weekend/after hours pager

## 2021-10-26 NOTE — Progress Notes (Signed)
Physical Therapy Treatment Patient Details Name: Gary [name] MRN: [id number] DOB: [birth year] Today's Date: 10/26/2021   History of Present Illness Patient was admitted secondary to SOB, COPD exacerbation, acute metabolic encephalopathy, and acute on chronic hypoxic and hypercapnic respiratory failure in setting of acute decompensated HFpEF and AECOPD requiring intubation and mechanical ventilation. The pt is now s/p tracheostomy and PEG tube placement 11/05/21.    PT Comments    Pt was slightly agitated but motivated to participate during the session and put forth good effort throughout. Pt able to complete supine<>sit w/ +2 modA for trunk control but is able to advance LE's to EOB; +2 maxA for sit<>supine for trunk control and leg management. Pt able to complete sit to stands w/ +2 modA to initiate stand from elevated bed. Continues to improve ability to initiate with improved upright posture and able to maintain stands for at least ~30secs and demonstrates ability for controlled eccentric. Made attempt to stand w/ +1 assist but unable. Pt able to side step HOB using RW w/ +2 modA for steadying and with physical/verbal cues for sequencing; very minimal clearance w/ steps. Pt will benefit from PT services in a SNF setting upon discharge to safely address deficits listed in patient problem list for decreased caregiver assistance and eventual return to PLOF.    Recommendations for follow up therapy are one component of a multi-disciplinary discharge planning process, led by the attending physician.  Recommendations may be updated based on patient status, additional functional criteria and insurance authorization.  Follow Up Recommendations  Skilled nursing-short term rehab (<3 hours/day) Can patient physically be transported by private vehicle: No   Assistance Recommended at Discharge Frequent or constant Supervision/Assistance  Patient can return home with the following Two people to help with  walking and/or transfers;Two people to help with bathing/dressing/bathroom;Assistance with cooking/housework;Assist for transportation;Help with stairs or ramp for entrance   Equipment Recommendations  Other (comment) (TBD)    Recommendations for Other Services       Precautions / Restrictions Precautions Precautions: Fall Precaution Comments: IJ, G tube, trach Restrictions Weight Bearing Restrictions: No     Mobility  Bed Mobility Overal bed mobility: Needs Assistance Bed Mobility: Supine to Sit, Sit to Supine     Supine to sit: HOB elevated Sit to supine: HOB elevated   General bed mobility comments: modA +2 for for trunk control ; able to transition legs EOB during supine to sit. +2 maxA for trunk control for sit<>supine    Transfers Overall transfer level: Needs assistance Equipment used: Rolling walker (2 wheels) Transfers: Sit to/from Stand Sit to Stand: From elevated surface, Mod assist, +2 physical assistance           General transfer comment: able to complete 4 STS w/ +23moA; improvement with initiating, clearance, and upright posture; chest more upright and hips more underneath. able to hold stand for  atleast ~30sec but no longer than 1 min    Ambulation/Gait Ambulation/Gait assistance: +2 physical assistance, Mod assist Gait Distance (Feet): 2 Feet Assistive device: Rolling walker (2 wheels) Gait Pattern/deviations: Step-to pattern, Decreased step length - right, Decreased step length - left Gait velocity: decreased     General Gait Details: able to take very small [facility]esteps to [facility] verbal/physical cues for sequencing and +2 modA for steadying   Stairs             Wheelchair Mobility    Modified Rankin (Stroke Patients Only)       Balance Overall  balance assessment: Needs assistance Sitting-balance support: Bilateral upper extremity supported, Feet supported Sitting balance-[name] Scale: Fair     Standing balance support:  Bilateral upper extremity supported, During functional activity, Reliant on assistive device for balance Standing balance-[name] Scale: Poor                              Cognition Arousal/Alertness: Awake/alert Behavior During Therapy: WFL for tasks assessed/performed Overall Cognitive Status: No family/caregiver present to determine baseline cognitive functioning                                          Exercises General Exercises - Lower Extremity Long Arc Quad: Strengthening, Both, 10 reps Heel Slides: Strengthening, Both, 10 reps    General Comments        Pertinent Vitals/Pain Pain Assessment Pain Assessment: No/denies pain    Home Living                          Prior Function            PT Goals (current goals can now be found in the care plan section) Progress towards PT goals: Progressing toward goals    Frequency    Min 2X/week      PT Plan Current plan remains appropriate    Co-evaluation              AM-PAC PT "6 Clicks" Mobility   Outcome Measure  Help needed turning from your back to your side while in a flat bed without using bedrails?: A Lot Help needed moving from lying on your back to sitting on the side of a flat bed without using bedrails?: A Lot Help needed moving to and from a bed to a chair (including a wheelchair)?: A Lot Help needed standing up from a chair using your arms (e.g., wheelchair or bedside chair)?: A Lot Help needed to walk in hospital room?: Total Help needed climbing 3-5 steps with a railing? : Total 6 Click Score: 10    End of Session Equipment Utilized During Treatment: Gait belt Activity Tolerance: Patient tolerated treatment well Patient left: in bed;with call bell/phone within reach Nurse Communication: Mobility status PT Visit Diagnosis: Unsteadiness on feet (R26.81);Muscle weakness (generalized) (M62.81);Difficulty in walking, not elsewhere classified (R26.2)      Time: 9528-4132 PT Time Calculation (min) (ACUTE ONLY): 41 min  Charges:                        Turner Daniels, SPT  10/26/2021, 12:15 PM

## 2021-10-26 NOTE — Progress Notes (Signed)
Occupational Therapy Treatment Patient Details Name: Gary [name] MRN: [id number] DOB: 1956-02-05 Today's Date: 10/26/2021   History of present illness Patient was admitted secondary to SOB, COPD exacerbation, acute metabolic encephalopathy, and acute on chronic hypoxic and hypercapnic respiratory failure in setting of acute decompensated HFpEF and AECOPD requiring intubation and mechanical ventilation. The pt is now s/p tracheostomy and PEG tube placement 11/05/21.   OT comments  Upon entering the room, pt supine in bed and agreeable to OT intervention. Pt agreeable to OT intervention. Pt needing +2 assistance for bed mobility to EOB. Static sitting EOB for ~ 5 minutes with close supervision. Ot adjusting bed to several heights with pt putting forth  no effort to attempt stands from these positions until therapist raised bed to patient's desired elevated height. Pt then standing with min -mod A of 2. While standing , hoyer lift placed under pt and securely placed when he returned to sitting EOB. Use of hoyer to transfer pt in recliner chair. All needs within reach. OT discussed pt sitting up for no longer than 3-4 hours ,until after dinner, and then returning to bed for skin integrity. OT also discussed this plan with MD, charge RN, and pt's RN this shift.    Recommendations for follow up therapy are one component of a multi-disciplinary discharge planning process, led by the attending physician.  Recommendations may be updated based on patient status, additional functional criteria and insurance authorization.    Follow Up Recommendations  Skilled nursing-short term rehab (<3 hours/day)    Assistance Recommended at Discharge Frequent or constant Supervision/Assistance  Patient can return home with the following  Two people to help with walking and/or transfers;Assistance with cooking/housework;Direct supervision/assist for medications management;Assist for transportation;Two people to help with  bathing/dressing/bathroom   Equipment Recommendations  Other (comment) (defer to next venue of care)       Precautions / Restrictions Precautions Precautions: Fall Precaution Comments: IJ, G tube, trach Restrictions Weight Bearing Restrictions: No       Mobility Bed Mobility Overal bed mobility: Needs Assistance Bed Mobility: Supine to Sit     Supine to sit: HOB elevated, Max assist, +2 for physical assistance          Transfers Overall transfer level: Needs assistance Equipment used: Rolling walker (2 wheels) Transfers: Sit to/from Stand Sit to Stand: From elevated surface, Mod assist, +2 physical assistance, Min assist                 Balance Overall balance assessment: Needs assistance Sitting-balance support: Bilateral upper extremity supported, Feet supported Sitting balance-[name] Scale: Good     Standing balance support: Bilateral upper extremity supported, During functional activity, Reliant on assistive device for balance Standing balance-[name] Scale: Poor                             ADL either performed or assessed with clinical judgement    Extremity/Trunk Assessment Upper Extremity Assessment LUE Deficits / Details: limited shoulder ROM in which the patient reports is his baseline, ~90 degrees of shoulder flexion.            Vision Patient Visual Report: No change from baseline            Cognition Arousal/Alertness: Awake/alert Behavior During Therapy: WFL for tasks assessed/performed Overall Cognitive Status: No family/caregiver present to determine baseline cognitive functioning  Pertinent Vitals/ Pain       Pain Assessment Pain Assessment: No/denies pain         Frequency  Min 2X/week        Progress Toward Goals  OT Goals(current goals can now be found in the care plan section)  Progress towards OT goals: Progressing toward  goals  Acute Rehab OT Goals Patient Stated Goal: to return to PLOF OT Goal Formulation: With patient Time For Goal Achievement: 10/30/21 Potential to Achieve Goals: [place] Discharge plan remains appropriate;Frequency remains appropriate       AM-PAC OT "6 Clicks" Daily Activity     Outcome Measure   Help from another person eating meals?: A Little Help from another person taking care of personal grooming?: A Little Help from another person toileting, which includes using toliet, bedpan, or urinal?: Total Help from another person bathing (including washing, rinsing, drying)?: A Lot Help from another person to put on and taking off regular upper body clothing?: A Little Help from another person to put on and taking off regular lower body clothing?: Total 6 Click Score: 13    End of Session Equipment Utilized During Treatment: Other (comment) (trach)  OT Visit Diagnosis: Other abnormalities of gait and mobility (R26.89);Muscle weakness (generalized) (M62.81)   Activity Tolerance Patient tolerated treatment well   Patient Left in chair;with call bell/phone within reach;with chair alarm set   Nurse Communication Mobility status;Other (comment) (plans to return to bed after dinner via hoyer lift)        Time: 5638-9373 OT Time Calculation (min): 42 min  Charges: OT General Charges $OT Visit: 1 Visit OT Treatments $Therapeutic Activity: 38-52 mins  Darleen Crocker, MS, OTR/L , CBIS ascom [phone]  10/26/21, 3:38 PM

## 2021-10-26 NOTE — TOC Progression Note (Signed)
Transition of Care [facility]) - Progression Note    Patient Details  Name: Gary [name] MRN: [id number] Date of Birth: 10/24/1955  [facility]) CM/SW [place], [place] Phone Number: 10/26/2021, 9:58 AM  Clinical Narrative:     CSW spoke with Lavone Orn with [facility] at [phone], he reports CSW can email referral and he will send out to his facilities that accept trach and let me know.   CSW has emailed referral to [email or web address]    Expected Discharge Plan: Long Term Acute Care (LTAC) Barriers to Discharge: Continued Medical Work up  Expected Discharge Plan and Services Expected Discharge Plan: Long Term Acute Care (LTAC)   Discharge Planning Services: CM Consult Post Acute Care Choice: Long Term Acute Care (LTAC) Living arrangements for the past 2 months: Single Family Home                 DME Arranged: N/A DME Agency: NA       HH Arranged: NA HH Agency: NA         Social Determinants of Health (SDOH) Interventions    Readmission Risk Interventions    05/04/2021    1:26 PM 04/07/2021    1:39 PM 03/12/2021   11:54 AM  Readmission Risk Prevention Plan  Transportation Screening Complete Complete Complete  PCP or Specialist Appt within 3-5 Days  Complete Complete  HRI or [place]   Complete  Social Work Consult for [place] Planning/Counseling   Complete  Palliative Care Screening  Not Applicable Not Applicable  Medication Review Press photographer) Complete Complete Complete  PCP or Specialist appointment within 3-5 days of discharge Complete    HRI or [place] Complete    SW Recovery Care/Counseling Consult Complete    [place] Not Applicable

## 2021-10-27 DIAGNOSIS — Z7189 Other specified counseling: Secondary | ICD-10-CM | POA: Diagnosis not present

## 2021-10-27 LAB — RENAL FUNCTION PANEL
Albumin: 2.6 g/dL — ABNORMAL LOW (ref 3.5–5.0)
Anion gap: 4 — ABNORMAL LOW (ref 5–15)
BUN: 25 mg/dL — ABNORMAL HIGH (ref 8–23)
CO2: 32 mmol/L (ref 22–32)
Calcium: 8.5 mg/dL — ABNORMAL LOW (ref 8.9–10.3)
Chloride: 106 mmol/L (ref 98–111)
Creatinine, Ser: 1.11 mg/dL (ref 0.61–1.24)
GFR, Estimated: >60 mL/min (ref >60)
Glucose, Bld: 115 mg/dL — ABNORMAL HIGH (ref 70–99)
Phosphorus: 3.8 mg/dL (ref 2.5–4.6)
Potassium: 3.9 mmol/L (ref 3.5–5.1)
Sodium: 142 mmol/L (ref 135–145)

## 2021-10-27 LAB — CBC WITH DIFFERENTIAL/PLATELET
Abs Immature Granulocytes: 0.04 10*3/uL (ref 0.00–0.07)
Basophils Absolute: 0 10*3/uL (ref 0.0–0.1)
Basophils Relative: 1 %
Eosinophils Absolute: 0.1 10*3/uL (ref 0.0–0.5)
Eosinophils Relative: 2 %
HCT: 35.6 % — ABNORMAL LOW (ref 39.0–52.0)
Hemoglobin: 9.6 g/dL — ABNORMAL LOW (ref 13.0–17.0)
Immature Granulocytes: 1 %
Lymphocytes Relative: 21 %
Lymphs Abs: 1.4 10*3/uL (ref 0.7–4.0)
MCH: 22.5 pg — ABNORMAL LOW (ref 26.0–34.0)
MCHC: 27 g/dL — ABNORMAL LOW (ref 30.0–36.0)
MCV: 83.4 fL (ref 80.0–100.0)
Monocytes Absolute: 0.6 10*3/uL (ref 0.1–1.0)
Monocytes Relative: 9 %
Neutro Abs: 4.5 10*3/uL (ref 1.7–7.7)
Neutrophils Relative %: 66 %
Platelets: 135 10*3/uL — ABNORMAL LOW (ref 150–400)
RBC: 4.27 MIL/uL (ref 4.22–5.81)
RDW: 18 % — ABNORMAL HIGH (ref 11.5–15.5)
WBC: 6.7 10*3/uL (ref 4.0–10.5)
nRBC: 0.7 % — ABNORMAL HIGH (ref 0.0–0.2)

## 2021-10-27 LAB — GLUCOSE, CAPILLARY
Glucose-Capillary: 107 mg/dL — ABNORMAL HIGH (ref 70–99)
Glucose-Capillary: 110 mg/dL — ABNORMAL HIGH (ref 70–99)
Glucose-Capillary: 115 mg/dL — ABNORMAL HIGH (ref 70–99)
Glucose-Capillary: 82 mg/dL (ref 70–99)
Glucose-Capillary: 92 mg/dL (ref 70–99)
Glucose-Capillary: 97 mg/dL (ref 70–99)

## 2021-10-27 LAB — MAGNESIUM: Magnesium: 1.7 mg/dL (ref 1.7–2.4)

## 2021-10-27 NOTE — Progress Notes (Signed)
   10/27/21 2100  Mobility  HOB Elevated/Bed Position Self regulated  Activity Dangled on edge of bed  Range of Motion/Exercises Active;Right arm;Left arm;Passive;Right leg;Left leg  Level of Assistance Maximum assist, patient does 25-49%  Assistive Device MaxiSky / Overhead Lift  Activity Response Tolerated fair

## 2021-10-27 NOTE — TOC Progression Note (Signed)
Transition of Care [facility]) - Progression Note    Patient Details  Name: Gary [name] MRN: [id number] Date of Birth: [birth year]  Transition of Care [facility]) CM/SW [place], [place] Phone Number: 10/27/2021, 1:46 PM  Clinical Narrative:     CSW followed up with Gary [name] with [facility] at [phone], he reports Gary [name] from [facility] in [place], [place] is reviewing his referral. CSW spoke with [place] at [phone], answered all questions and she reports they will call back with decision.   CSW lvm with [phone] Ed with [facility] [place] to inquire about [facility], as prior Josem Kaufmann form was previously faxed to them to see if they would make an exception in covering patient for [facility]. Pending call back.   Expected Discharge Plan: Long Term Acute Care (LTAC) Barriers to Discharge: Continued Medical Work up  Expected Discharge Plan and Services Expected Discharge Plan: Long Term Acute Care (LTAC)   Discharge Planning Services: CM Consult Post Acute Care Choice: Long Term Acute Care (LTAC) Living arrangements for the past 2 months: Single Family Home                 DME Arranged: N/A DME Agency: NA       HH Arranged: NA HH Agency: NA         Social Determinants of Health (SDOH) Interventions    Readmission Risk Interventions    05/04/2021    1:26 PM 04/07/2021    1:39 PM 03/12/2021   11:54 AM  Readmission Risk Prevention Plan  Transportation Screening Complete Complete Complete  PCP or Specialist Appt within 3-5 Days  Complete Complete  HRI or [place]   Complete  Social Work Consult for [place] Planning/Counseling   Complete  Palliative Care Screening  Not Applicable Not Applicable  Medication Review Press photographer) Complete Complete Complete  PCP or Specialist appointment within 3-5 days of discharge Complete    HRI or [place] Complete    SW Recovery Care/Counseling Consult Complete    [place] Not Applicable

## 2021-10-27 NOTE — Progress Notes (Signed)
PROGRESS NOTE    Gary [name]  TML:[id number] DOB: Sep 22, 1955  DOA: 09/19/2021 Date of Service: 10/27/21 PCP: [facility], [facility] Course:  66 y/o male with multiple medical problems causing chronic hypercapneic respiratory failure who has had admissions in the past for exacerbations of the same and mechanical ventilation presented for evaluation of dyspnea, weakness. It is unclear if the patient has been compliant with his medications and NIMV at home.  He was admitted by the hospitalists for a presumed COPD exacerbation in the ER, treated with bronchodilators and NIMV but his mental status and hypercarbia worsened despite those interventions so he required intubation.   6/6: Presented to ED.  Required intubation and mechanical ventilation in the ED.  PCCM admit ICU 6/7: Hold diuresis due to worsening Creatinine.  ENT consulted for Trach placement per family request 6/8: Pt continues to spike temps tmax 101.8 degrees F.  Flexiseal placed overnight draining foul smelling watery stool~Cdiff and GI panel pending  6/9: Fever resolved.  Negative for C- diff, GI panel still pending.  Creatinine slightly worsened, requiring low dose levophed (2 mcg). Holding diuresis 6/10: propofol discontinued due hypertriglyceridemia; failed SBT 6/11: Tracheal aspirate from 6/6 resulting with Pseudomonas, ABX changed to Cefepime. neurologically intact, awaiting Trach placement 6/12: Pt calmer today following addition of oral benzo's, narcotics, and Seroquel via tube.  Creatinine worsened with decreased UOP, holding diuresis, low threshold for Nephrology consult  6/13: Pt with worsening renal failure creatinine 4.15.  UOP overnight 25 ml.  Nephrology consulted plans to start pt on hemodialysis follow dialysis catheter placement Pt underwent hemodialysis without ultrafiltration 06/14: .  Plans for HD session today.  Agitation has improved significantly with narcotics and antianxiety medications  per tube   06/16: Tracheostomy canceled 06/15 due to hyperkalemia and tentatively rescheduled for 06/20  6/18 failure to wean from vent 06/20: ENT placed Tracheostomy (#7.0 XLT cuffed).  Developed mild epistaxis post unsuccessful attempt at NGT placement.  Later developed hematochezia/melena, Protonix gtt started and GI consulted. 06/21: Diuresed yesterday with limited results. Patient had softed blood pressures throughout the day and diuresis was held as a result. HD was performed by the patient clotted off the machine and was not able to be rinsed back. Repeat hemoglobin is above 8. 06/23: Pt remains mechanically intubated settings: FiO2 50%/PEEP 10.  CXR concerning for moderate right pleural effusion with associated right basilar atelectasis vs. infiltrate.  Will perform [place] Chest to determine if pt needs right sided thoracentesis  06/24: Remains on the ventilator, FiO2 requirements decreasing, respiratory culture with abundant gram-negative rods: [name], on Meropenem 06/25: Oxygen requirements down to 40%, chest x-ray shows improvement on dense right consolidation, Pseudomonas susceptibilities pending 06/25: Pt remains on the ventilator settings: PEEP 10 and FiO2 40%; will attempt to wean PEEP to 5 to perform SBT  06/28: Pt tolerated PS on 06/27.  Overnight pt had episodes of vomiting TF's suspect secondary to malposition of nasogastric tube.  Will consult IR for PEG tube placement if unable to place PEG tube pt will need postpyloric Dobbhoff   06/29: Pt tolerated PS 5/5 for over an hour yesterday, however due to fatigue placed back on PRVC.  Pt pulled NGT out several times overnight, therefore will not attempt to reinsert.  Plans for PEG tube placement per IR on 06/30.   06/30: PEG tube placed per IR  07/1: Pt tolerated trach collar '@40'$ % throughout the day 07/2: Pt placed on ventilator overnight settings 16/5/35% tolerating well.  Will place pt on trach collar today and place orders  for PT/OT 7/3: Hemodynamically stable.  Tolerating TC (has remained on TC since 7/2 @ 08:00), plan for speech therapy and PT/OT 7/4: Has maintained on trach collar for greater than 48 hours, tolerating Passy-Muir valve.  Plan to transfer out of ICU. SLP recs: dysphagia 1 (puree) w/ nectar liquid.  7/5-7/6: sodium and creatinine trending appropriately, Hgb stable. Low K repleted and stable. Was off vent qhs (orders d/c or missed?) and was restarted. Will need LTAC, TOC following  7/7: pulled out trach overnight. Confusion re: vent plan among staff and myself. I asked PCCM to reevluate him and verify plan so the team and the patient can eb on the same page, and will help TOC arrange appropriate placement.  7/8: I spoke personally w/ Dr Milon Dikes [facility]) and he confirms patient does NOT need ventilator support, unless of course develops distress, but last night the patient tolerated well without it. Plan to meet w/ family today when they visit, I spoke w/ his sister yesterday on the phone. No concerns on CBC/BMP: Cr tending down a bit, Hgb stable, Na slightly high but improving over past few days. He is approaching medically stable and may be appropriate for LTAC vs SNF soon  7/9: Several long discussions with patient and family about medical condition, behavioral issues.  See separate progress notes.  Psychiatry evaluated, agree that he has capacity, started SSRI. 7/10: Patient remains medically stable, awaiting placement.  Discussed his behavioral issues again and encouraged him to always bring any concerns to nursing staff or to me calmly and I am happy to listen, requested he maintain professionalism towards staff, he seems amenable. 7/11-7/14: goal OOB to chair, needing hoyer lift, pt reeducated several times about staying in bed 07/11 and has been compliant through 07/14. Finally got bariatric chair and hoyer lift 7/12 and was OOB 07/13.  D/c monitoring and sitter and transferred to [place] status 07/13.  Awaiting placement.    Consultants:  PCCU Nephrology ENT GI IR Psychiatry  Procedures: 10/03/21: tracheostomy placement 10/12/21: dialysis permacath placement  10/13/21: G-tube placed    Subjective: Patient today again reports frustration about being stuck in bed, would like to get up and move around.  He denies CP/SOB.      ASSESSMENT & PLAN:   Principal Problem:   Acute on chronic respiratory failure with hypoxia and hypercapnia ([name]) Active Problems:   Major depressive disorder, recurrent episode, moderate ([name])   Acute on chronic diastolic congestive heart failure ([name])   COPD with acute exacerbation ([name])   Acute metabolic encephalopathy   HTN (hypertension)   Obesity, Class III, BMI 40-49.9 (morbid obesity) ([place])   Obstructive sleep apnea   Chronic kidney disease, stage 3a ([name])   HLD (hyperlipidemia)   Pressure injury of skin    Acute on Chronic Hypoxic & Hypercapnic Respiratory Failure in the setting of acute decompensated HFpEF, AECOPD, Pseudomonas Pneumonia, & OSA/OHS Moderate right sided pleural effusion -ruled out, actually lung Consolidation --> STABLE S/p  tracheostomy by ENT by 6/20 Trach collar w/ ventilator support as needed / qhs but he does NOT need vent supoort overnight routinely per my conversation w/ Dr Milon Dikes 07/08 Completed 10 day course of ceftazidime for treatment  of Pseudomonas At this point, he is medically stable but placement is going to be an issue for him.  I have discussed that his behavioral issues are impeding placement and his behavior has improved.    Acute decompensated HFpEF  PMHx: Hypertension --> RESOLVED/STABLE  Echocardiogram 08/07/2021: LVEF 60 to 97%, grade 1 diastolic dysfunction, normal RV systolic function Was on intermittent hemodialysis for volume removal - has not needed HD and has remained compensated. cancelled permacath placement    AKI superimposed CKD stage IIIa  Hypokalemia  Hypernatremia -->  RESOLVED During admissions in February 2023, creatinine 1.8 with GFR 40; in May 2023 creatinine 1.1 with GFR of 60 Monitor I&O's / urinary output Follow BMP  Cr down, good UOP noted, d/c dialysis catheter, monitor renal parameters daily. 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 Diff negative   Acute Blood Loss Anemia --> RESOLVED Nasopharyngeal trauma vs Upper GI bleed Trend CBC  Monitor for s/sx of bleeding and transfuse for Hgb <7 Continue BID IV PPI per GI, no need for scope  nephrology recommends consider epogen as outpatient    Hyperglycemia --> STABLE CBG's achs SSI  Acute Metabolic Encephalopathy in setting of CO2 Narcosis Sedation needs in setting of mechanical ventilation --> RESOLVED CT Head 09/19/21: negative;  UDS negative Treatment of hypercapnia as outlined above  Trach in place Gtube in place Was Requiring ventilatory support at night, has done well without this Working on increasing po intake, SLP and dietary following  Has not required ventilatory support, doing well on O2 via the trach      DVT prophylaxis:lovenox Code Status: FULL Family Communication:  sister at bedside today on rounds  Disposition Plan / TOC needs: to med-surg 07/13, awaiting placement  Barriers to discharge / significant pending items: remains on tube feeds, diet advancing, remains on trach - may need LTAC, discussed his behavioral issues with him several days ago and he has been acting more professionally toward staff since then. Caryl Pina [facility]) to reach out for facilities which may be further distance, pending              Objective: Vitals:   10/27/21 0109 10/27/21 0200 10/27/21 0427 10/27/21 0432  BP:   (!) 121/91   Pulse:   83 83  Resp: 20   19   Temp:   98.4 F (36.9 C)   TempSrc:      SpO2:  90% 96% 92%  Weight:      Height:        Intake/Output Summary (Last 24 hours) at 10/27/2021 0263 Last data filed at 10/26/2021 1958 Gross per 24 hour  Intake 360 ml  Output 800 ml  Net -440 ml    Filed Weights   10/22/21 0500 10/24/21 0330 10/26/21 0331  Weight: (!) 143 kg (!) 166 kg 123.4 kg    Examination:  Constitutional:  VS as above General Appearance: alert, well-developed, well-nourished, NAD Eyes: Normal lids and conjunctive, non-icteric sclera Ears, Nose, Mouth, Throat: Normal appearance Neck: No masses, trachea midline Trach in place, appears clean and ventilating well  Respiratory: Normal respiratory effort CTABL Cardiovascular: S1S2, RRR Musculoskeletal:  No clubbing/cyanosis of digits Neurological: No cranial nerve deficit on limited exa Psychiatric: Normal judgment/insight Calm lately Has capacity to make informed medical decisions.       Scheduled Medications:   atorvastatin  10 mg Per Tube Daily   budesonide (PULMICORT) nebulizer solution  0.5 mg Nebulization BID   Chlorhexidine Gluconate Cloth  6 each Topical Daily   cholecalciferol  2,000 Units Per Tube Daily   enoxaparin (  LOVENOX) injection  0.5 mg/kg Subcutaneous Q24H   epoetin (EPOGEN/PROCRIT) injection  20,000 Units Subcutaneous Weekly   escitalopram  5 mg Oral Daily   feeding supplement (NEPRO CARB STEADY)  1,000 mL Per Tube Q24H   feeding supplement (PROSource TF)  90 mL Per Tube TID   free water  200 mL Per Tube 6 X Daily   ipratropium-albuterol  3 mL Nebulization BID   lidocaine  1 patch Transdermal Q24H   nutrition supplement (JUVEN)  1 packet Per Tube BID BM   mouth rinse  15 mL Mouth Rinse 4 times per day   pantoprazole sodium  40 mg Per Tube BID   potassium chloride  20 mEq Oral Daily   traZODone  50 mg Oral QHS    Continuous Infusions:  sodium chloride Stopped (10/07/21 0542)    PRN Medications:   acetaminophen, acetaminophen, albuterol, bisacodyl, clonazepam, heparin, loperamide, nitroGLYCERIN, mouth rinse, oxyCODONE, pentafluoroprop-tetrafluoroeth, polyethylene glycol, QUEtiapine  Antimicrobials:  Anti-infectives (From admission, onward)    Start     Dose/Rate Route Frequency Ordered Stop   10/12/21 1200  cefTAZidime (FORTAZ) 2 g in sodium chloride 0.9 % 100 mL IVPB        2 g 200 mL/hr over 30 Minutes Intravenous 2 times daily 10/12/21 1122 10/17/21 0844   10/12/21 0417  ceFAZolin (ANCEF) IVPB 1 g/50 mL premix  Status:  Discontinued        1 g 100 mL/hr over 30 Minutes Intravenous 30 min pre-op 10/12/21 0417 10/16/21 0751   10/09/21 2000  ceFEPIme (MAXIPIME) 1 g in sodium chloride 0.9 % 100 mL IVPB  Status:  Discontinued        1 g 200 mL/hr over 30 Minutes Intravenous Every 24 hours 10/09/21 1404 10/09/21 1515   10/09/21 2000  cefTAZidime (FORTAZ) 1 g in sodium chloride 0.9 % 100 mL IVPB  Status:  Discontinued        1 g 200 mL/hr over 30 Minutes Intravenous Every 24 hours 10/09/21 1515 10/12/21 1122   10/06/21 1830  meropenem (MERREM) 500 mg in sodium chloride 0.9 % 100 mL IVPB  Status:  Discontinued        500 mg 200 mL/hr over 30 Minutes Intravenous Every 24 hours 10/06/21 1735 10/09/21 1403   10/06/21 1800  doxycycline (VIBRAMYCIN) 100 mg in sodium chloride 0.9 % 250 mL IVPB  Status:  Discontinued        100 mg 125 mL/hr over 120 Minutes Intravenous Every 12 hours 10/06/21 1657 10/08/21 0838   09/29/21 1000  ceFEPIme (MAXIPIME) 2 g in sodium chloride 0.9 % 100 mL IVPB        2 g 200 mL/hr over 30 Minutes Intravenous Every 12 hours 09/29/21 0812 09/30/21 2342   09/26/21 2200  ceFEPIme (MAXIPIME) 1 g in sodium chloride 0.9 % 100 mL IVPB  Status:  Discontinued        1 g 200 mL/hr over 30 Minutes Intravenous Every 24 hours 09/26/21 1154 09/29/21 0812   09/26/21 2000  ceFEPIme (MAXIPIME) 2 g in sodium chloride 0.9 % 100 mL IVPB  Status:  Discontinued        2 g 200 mL/hr  over 30 Minutes Intravenous Every 12 hours 09/26/21 0809 09/26/21 1154   09/24/21 1200  ceFEPIme (MAXIPIME) 2 g in sodium chloride 0.9 % 100 mL IVPB  Status:  Discontinued        2 g 200 mL/hr over 30 Minutes Intravenous Every 8 hours 09/24/21 0950 09/26/21  0809   09/21/21 1430  azithromycin (ZITHROMAX) 500 mg in sodium chloride 0.9 % 250 mL IVPB        500 mg 250 mL/hr over 60 Minutes Intravenous Every 24 hours 09/21/21 1344 09/23/21 1543   09/21/21 1430  cefTRIAXone (ROCEPHIN) 2 g in sodium chloride 0.9 % 100 mL IVPB  Status:  Discontinued        2 g 200 mL/hr over 30 Minutes Intravenous Every 24 hours 09/21/21 1344 09/24/21 0922       Data Reviewed: I have personally reviewed following labs and imaging studies  CBC: Recent Labs  Lab 10/22/21 1310 10/23/21 0244 10/24/21 0840 10/25/21 0344 10/26/21 0632  WBC 7.9 7.6 6.6 7.8 8.8  NEUTROABS 5.3 4.7 4.0 4.7 6.0  HGB 9.2* 8.6* 8.7* 9.1* 9.2*  HCT 34.1* 31.9* 32.0* 33.6* 34.2*  MCV 83.8 82.2 82.1 82.2 84.2  PLT 171 192 156 147* 137*    Basic Metabolic Panel: Recent Labs  Lab 10/22/21 1310 10/23/21 0244 10/24/21 0840 10/25/21 0344 10/26/21 0632  NA 147*  147* 144 141 141  142 140  K 3.8  3.9 3.6 3.4* 3.3*  3.4* 3.8  CL 110  110 110 105 105  106 103  CO2 '31  30 31 31 31  31 '$ 32  GLUCOSE 112*  111* 107* 103* 102*  103* 103*  BUN 40*  39* 32* 24* 22  21 26*  CREATININE 1.13  1.12 1.15 1.03 1.11  1.05 1.26*  CALCIUM 8.7*  8.6* 8.6* 8.4* 8.3*  8.5* 8.2*  MG 1.6* 1.6* 1.5* 1.7 1.8  PHOS 2.1* 2.4* 3.4 4.1 4.7*    GFR: Estimated Creatinine Clearance: 83.9 mL/min (A) (by C-G formula based on SCr of 1.26 mg/dL (H)). Liver Function Tests: Recent Labs  Lab 10/22/21 1310 10/23/21 0244 10/24/21 0840 10/25/21 0344 10/26/21 0254  ALBUMIN 2.6* 2.4* 2.5* 2.6* 2.6*    No results for input(s): "LIPASE", "AMYLASE" in the last 168 hours. No results for input(s): "AMMONIA" in the last 168 hours. Coagulation  Profile: No results for input(s): "INR", "PROTIME" in the last 168 hours. Cardiac Enzymes: No results for input(s): "CKTOTAL", "CKMB", "CKMBINDEX", "TROPONINI" in the last 168 hours. BNP (last 3 results) No results for input(s): "PROBNP" in the last 8760 hours. HbA1C: No results for input(s): "HGBA1C" in the last 72 hours. CBG: Recent Labs  Lab 10/26/21 1635 10/26/21 2000 10/27/21 0018 10/27/21 0425 10/27/21 0727  GLUCAP 94 83 92 97 82    Lipid Profile: No results for input(s): "CHOL", "HDL", "LDLCALC", "TRIG", "CHOLHDL", "LDLDIRECT" in the last 72 hours. Thyroid Function Tests: No results for input(s): "TSH", "T4TOTAL", "FREET4", "T3FREE", "THYROIDAB" in the last 72 hours. Anemia Panel: No results for input(s): "VITAMINB12", "FOLATE", "FERRITIN", "TIBC", "IRON", "RETICCTPCT" in the last 72 hours. Urine analysis:    Component Value Date/Time   COLORURINE STRAW (A) 09/19/2021 1247   APPEARANCEUR CLEAR (A) 09/19/2021 1247   APPEARANCEUR Clear 04/15/2014 1240   LABSPEC 1.009 09/19/2021 1247   LABSPEC 1.013 04/15/2014 1240   PHURINE 5.0 09/19/2021 1247   GLUCOSEU 150 (A) 09/19/2021 1247   GLUCOSEU Negative 04/15/2014 1240   HGBUR NEGATIVE 09/19/2021 1247   BILIRUBINUR NEGATIVE 09/19/2021 1247   BILIRUBINUR Negative 04/15/2014 1240   KETONESUR NEGATIVE 09/19/2021 1247   PROTEINUR NEGATIVE 09/19/2021 1247   NITRITE NEGATIVE 09/19/2021 1247   LEUKOCYTESUR NEGATIVE 09/19/2021 1247   LEUKOCYTESUR Negative 04/15/2014 1240   Sepsis Labs: '@LABRCNTIP'$ (procalcitonin:4,lacticidven:4)  Recent Results (from the past 240 hour(s))  Gastrointestinal  Panel by PCR , Stool     Status: None   Collection Time: 10/21/21  2:13 AM   Specimen: Stool  Result Value Ref Range Status   Campylobacter species NOT DETECTED NOT DETECTED Final   Plesimonas shigelloides NOT DETECTED NOT DETECTED Final   Salmonella species NOT DETECTED NOT DETECTED Final   Yersinia enterocolitica NOT DETECTED NOT  DETECTED Final   Vibrio species NOT DETECTED NOT DETECTED Final   Vibrio cholerae NOT DETECTED NOT DETECTED Final   Enteroaggregative E coli (EAEC) NOT DETECTED NOT DETECTED Final   Enteropathogenic E coli (EPEC) NOT DETECTED NOT DETECTED Final   Enterotoxigenic E coli (ETEC) NOT DETECTED NOT DETECTED Final   Shiga like toxin producing E coli (STEC) NOT DETECTED NOT DETECTED Final   Shigella/Enteroinvasive E coli (EIEC) NOT DETECTED NOT DETECTED Final   Cryptosporidium NOT DETECTED NOT DETECTED Final   Cyclospora cayetanensis NOT DETECTED NOT DETECTED Final   Entamoeba histolytica NOT DETECTED NOT DETECTED Final   Giardia lamblia NOT DETECTED NOT DETECTED Final   Adenovirus F40/41 NOT DETECTED NOT DETECTED Final   Astrovirus NOT DETECTED NOT DETECTED Final   Norovirus GI/GII NOT DETECTED NOT DETECTED Final   Rotavirus A NOT DETECTED NOT DETECTED Final   Sapovirus (I, II, IV, and V) NOT DETECTED NOT DETECTED Final    Comment: Performed at [facility], [address], [place], [place] [address]         Radiology Studies last 96 hours: No results found.          LOS: 63 days      Emeterio Reeve, DO [facility] 10/27/2021, 8:07 AM   Staff may message me via secure chat in [place]  but this may not receive immediate response,  please page for urgent matters!  If 7PM-7AM, please contact night-coverage [email or web address]  Dictation software was used to generate the above note. Typos may occur and escape review, as with typed/written notes. Please contact Dr Sheppard Coil directly for clarity if needed.

## 2021-10-28 ENCOUNTER — Inpatient Hospital Stay: Payer: Medicaid Other

## 2021-10-28 DIAGNOSIS — I10 Essential (primary) hypertension: Secondary | ICD-10-CM

## 2021-10-28 LAB — GLUCOSE, CAPILLARY
Glucose-Capillary: 105 mg/dL — ABNORMAL HIGH (ref 70–99)
Glucose-Capillary: 141 mg/dL — ABNORMAL HIGH (ref 70–99)

## 2021-10-28 LAB — CBC
HCT: 35.1 % — ABNORMAL LOW (ref 39.0–52.0)
Hemoglobin: 9.6 g/dL — ABNORMAL LOW (ref 13.0–17.0)
MCH: 22.7 pg — ABNORMAL LOW (ref 26.0–34.0)
MCHC: 27.4 g/dL — ABNORMAL LOW (ref 30.0–36.0)
MCV: 83 fL (ref 80.0–100.0)
Platelets: 143 10*3/uL — ABNORMAL LOW (ref 150–400)
RBC: 4.23 MIL/uL (ref 4.22–5.81)
RDW: 18 % — ABNORMAL HIGH (ref 11.5–15.5)
WBC: 8.1 10*3/uL (ref 4.0–10.5)
nRBC: 1.4 % — ABNORMAL HIGH (ref 0.0–0.2)

## 2021-10-28 LAB — BASIC METABOLIC PANEL
Anion gap: 4 — ABNORMAL LOW (ref 5–15)
BUN: 34 mg/dL — ABNORMAL HIGH (ref 8–23)
CO2: 31 mmol/L (ref 22–32)
Calcium: 8.3 mg/dL — ABNORMAL LOW (ref 8.9–10.3)
Chloride: 108 mmol/L (ref 98–111)
Creatinine, Ser: 1.17 mg/dL (ref 0.61–1.24)
GFR, Estimated: >60 mL/min (ref >60)
Glucose, Bld: 80 mg/dL (ref 70–99)
Potassium: 4 mmol/L (ref 3.5–5.1)
Sodium: 143 mmol/L (ref 135–145)

## 2021-10-28 LAB — TROPONIN I (HIGH SENSITIVITY): Troponin I (High Sensitivity): 8 ng/L (ref <18)

## 2021-10-28 MED ORDER — ENOXAPARIN SODIUM 60 MG/0.6ML IJ SOSY: 0.5000 mg/kg | PREFILLED_SYRINGE | INTRAMUSCULAR | Status: DC

## 2021-10-28 MED ORDER — BENZONATATE 100 MG PO CAPS
200.0000 mg | ORAL_CAPSULE | Freq: Three times a day (TID) | ORAL | Status: DC | PRN
  Administered 2021-10-31 – 2021-12-12 (×42): 200 mg via ORAL
  Filled 2021-10-28 (×42): qty 2

## 2021-10-28 MED ORDER — HYDROXYZINE HCL 25 MG PO TABS
25.0000 mg | ORAL_TABLET | Freq: Three times a day (TID) | ORAL | Status: DC | PRN
  Administered 2021-10-28 – 2021-12-12 (×16): 25 mg via ORAL
  Filled 2021-10-28 (×16): qty 1

## 2021-10-28 MED ORDER — ENOXAPARIN SODIUM 80 MG/0.8ML IJ SOSY
0.5000 mg/kg | PREFILLED_SYRINGE | INTRAMUSCULAR | Status: DC
  Administered 2021-10-29 (×2): 62.5 mg via SUBCUTANEOUS
  Filled 2021-10-28 (×2): qty 0.63

## 2021-10-28 MED ORDER — GUAIFENESIN 100 MG/5ML PO LIQD
15.0000 mL | Freq: Three times a day (TID) | ORAL | Status: AC | PRN
  Administered 2021-10-28 – 2021-11-11 (×4): 15 mL via ORAL
  Filled 2021-10-28 (×5): qty 20

## 2021-10-28 NOTE — Progress Notes (Addendum)
PROGRESS NOTE    Gary [name]  RJJ:[id number] DOB: 11-05-1955  DOA: 09/19/2021 Date of Service: 10/28/21 PCP: [facility] Course:  66 y/o male with multiple medical problems causing chronic hypercapneic respiratory failure who has had admissions in the past for exacerbations of the same and mechanical ventilation presented for evaluation of dyspnea, weakness. It is unclear if the patient has been compliant with his medications and NIMV at home.  He was admitted by the hospitalists for a presumed COPD exacerbation in the ER, treated with bronchodilators and NIMV but his mental status and hypercarbia worsened despite those interventions so he required intubation.   6/6: Presented to ED.  Required intubation and mechanical ventilation in the ED.  PCCM admit ICU 6/7: Hold diuresis due to worsening Creatinine.  ENT consulted for Trach placement per family request 6/8: Pt continues to spike temps tmax 101.8 degrees F.  Flexiseal placed overnight draining foul smelling watery stool~Cdiff and GI panel pending  6/9: Fever resolved.  Negative for C- diff, GI panel still pending.  Creatinine slightly worsened, requiring low dose levophed (2 mcg). Holding diuresis 6/10: propofol discontinued due hypertriglyceridemia; failed SBT 6/11: Tracheal aspirate from 6/6 resulting with Pseudomonas, ABX changed to Cefepime. neurologically intact, awaiting Trach placement 6/12: Pt calmer today following addition of oral benzo's, narcotics, and Seroquel via tube.  Creatinine worsened with decreased UOP, holding diuresis, low threshold for Nephrology consult  6/13: Pt with worsening renal failure creatinine 4.15.  UOP overnight 25 ml.  Nephrology consulted plans to start pt on hemodialysis follow dialysis catheter placement Pt underwent hemodialysis without ultrafiltration 06/14: .  Plans for HD session today.  Agitation has improved significantly with narcotics and antianxiety medications  per tube   06/16: Tracheostomy canceled 06/15 due to hyperkalemia and tentatively rescheduled for 06/20  6/18 failure to wean from vent 06/20: ENT placed Tracheostomy (#7.0 XLT cuffed).  Developed mild epistaxis post unsuccessful attempt at NGT placement.  Later developed hematochezia/melena, Protonix gtt started and GI consulted. 06/21: Diuresed yesterday with limited results. Patient had softed blood pressures throughout the day and diuresis was held as a result. HD was performed by the patient clotted off the machine and was not able to be rinsed back. Repeat hemoglobin is above 8. 06/23: Pt remains mechanically intubated settings: FiO2 50%/PEEP 10.  CXR concerning for moderate right pleural effusion with associated right basilar atelectasis vs. infiltrate.  Will perform [place] Chest to determine if pt needs right sided thoracentesis  06/24: Remains on the ventilator, FiO2 requirements decreasing, respiratory culture with abundant gram-negative rods: [name], on Meropenem 06/25: Oxygen requirements down to 40%, chest x-ray shows improvement on dense right consolidation, Pseudomonas susceptibilities pending 06/25: Pt remains on the ventilator settings: PEEP 10 and FiO2 40%; will attempt to wean PEEP to 5 to perform SBT  06/28: Pt tolerated PS on 06/27.  Overnight pt had episodes of vomiting TF's suspect secondary to malposition of nasogastric tube.  Will consult IR for PEG tube placement if unable to place PEG tube pt will need postpyloric Dobbhoff   06/29: Pt tolerated PS 5/5 for over an hour yesterday, however due to fatigue placed back on PRVC.  Pt pulled NGT out several times overnight, therefore will not attempt to reinsert.  Plans for PEG tube placement per IR on 06/30.   06/30: PEG tube placed per IR  07/1: Pt tolerated trach collar '@40'$ % throughout the day 07/2: Pt placed on ventilator overnight settings 16/5/35% tolerating well.  Will place pt on trach collar today and place orders  for PT/OT 7/3: Hemodynamically stable.  Tolerating TC (has remained on TC since 7/2 @ 08:00), plan for speech therapy and PT/OT 7/4: Has maintained on trach collar for greater than 48 hours, tolerating Passy-Muir valve.  Plan to transfer out of ICU. SLP recs: dysphagia 1 (puree) w/ nectar liquid.  7/5-7/6: sodium and creatinine trending appropriately, Hgb stable. Low K repleted and stable. Was off vent qhs (orders d/c or missed?) and was restarted. Will need LTAC, TOC following  7/7: pulled out trach overnight. Confusion re: vent plan among staff and myself. I asked PCCM to reevluate him and verify plan so the team and the patient can eb on the same page, and will help TOC arrange appropriate placement.  7/8: I spoke personally w/ Dr Milon Dikes [facility]) and he confirms patient does NOT need ventilator support, unless of course develops distress, but last night the patient tolerated well without it. Plan to meet w/ family today when they visit, I spoke w/ his sister yesterday on the phone. No concerns on CBC/BMP: Cr tending down a bit, Hgb stable, Na slightly high but improving over past few days. He is approaching medically stable and may be appropriate for LTAC vs SNF soon  7/9: Several long discussions with patient and family about medical condition, behavioral issues.  See separate progress notes.  Psychiatry evaluated, agree that he has capacity, started SSRI. 7/10: Patient remains medically stable, awaiting placement.  Discussed his behavioral issues again and encouraged him to always bring any concerns to nursing staff or to me calmly and I am happy to listen, requested he maintain professionalism towards staff, he seems amenable. 7/11-7/14: goal OOB to chair, needing hoyer lift, pt reeducated several times about staying in bed 07/11 and has been compliant through 07/14. Finally got bariatric chair and hoyer lift 7/12 and was OOB 07/13.  D/c monitoring and sitter and transferred to [place] status 07/13.  Awaiting placement.  7/15- working to reorient patient, but personality and coherence remain difficult. Stable for placement. Reminded patient not to verbally abuse staff   Consultants:  PCCU Nephrology ENT GI IR Psychiatry  Procedures: 10/03/21: tracheostomy placement 10/12/21: dialysis permacath placement  10/13/21: G-tube placed    Subjective: NAEON, patient refused all interventions overnight for c/o CP Reminded to be nice to professional staff who are working to help him in a healthcare setting.     ASSESSMENT & PLAN:   Principal Problem:   Acute on chronic respiratory failure with hypoxia and hypercapnia ([name]) Active Problems:   Major depressive disorder, recurrent episode, moderate ([name])   Acute on chronic diastolic congestive heart failure ([name])   COPD with acute exacerbation ([name])   Acute metabolic encephalopathy   HTN (hypertension)   Obesity, Class III, BMI 40-49.9 (morbid obesity) ([place])   Obstructive sleep apnea   Chronic kidney disease, stage 3a ([name])   HLD (hyperlipidemia)   Pressure injury of skin    Acute on Chronic Hypoxic & Hypercapnic Respiratory Failure in the setting of acute decompensated HFpEF, AECOPD, Pseudomonas Pneumonia, & OSA/OHS Moderate right sided pleural effusion -ruled out, actually lung Consolidation --> STABLE S/p  tracheostomy by ENT by 6/20 Trach collar w/ ventilator support as needed / qhs but he does NOT need vent supoort overnight routinely per my conversation w/ Dr Milon Dikes 07/08 Completed 10 day course of ceftazidime for treatment  of Pseudomonas At this point, he is medically stable but placement is going to be an issue  for him.  I have discussed that his behavioral issues are impeding placement and his behavior has improved.    Acute decompensated HFpEF PMHx: Hypertension --> RESOLVED/STABLE  Echocardiogram 08/07/2021: LVEF 60 to 76%, grade 1 diastolic dysfunction, normal RV systolic function Was on intermittent  hemodialysis for volume removal - has not needed HD and has remained compensated. cancelled permacath placement    AKI superimposed CKD stage IIIa  Hypokalemia  Hypernatremia --> RESOLVED During admissions in February 2023, creatinine 1.8 with GFR 40; in May 2023 creatinine 1.1 with GFR of 60 Monitor I&O's / urinary output Follow BMP  Cr down, good UOP noted, d/c dialysis catheter, monitor renal parameters daily. 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 Diff negative   Acute Blood Loss Anemia --> RESOLVED Nasopharyngeal trauma vs Upper GI bleed Trend CBC  Monitor for s/sx of bleeding and transfuse for Hgb <7 Continue BID IV PPI per GI, no need for scope  nephrology recommends consider epogen as outpatient    Hyperglycemia --> STABLE CBG's achs SSI  Acute Metabolic Encephalopathy in setting of CO2 Narcosis Sedation needs in setting of mechanical ventilation --> RESOLVED CT Head 09/19/21: negative;  UDS negative Treatment of hypercapnia as outlined above  Trach in place Gtube in place Was Requiring ventilatory support at night, has done well without this Working on increasing po intake, SLP and dietary following  Has not required ventilatory support, doing well on O2 via the trach      DVT prophylaxis:lovenox Code Status: FULL Family Communication:  sister at bedside today on rounds  Disposition Plan / TOC needs: to med-surg 07/13, awaiting placement  Barriers to discharge / significant pending items: remains on tube feeds, diet advancing, remains on trach - may need LTAC, discussed his behavioral issues with him several days ago and he has been acting more professionally toward staff since then. [facility]) to reach out for facilities which may  be further distance, pending              Objective: Vitals:   10/27/21 1700 10/27/21 2050 10/27/21 2135 10/28/21 0606  BP: (!) 151/90  113/74 (!) 145/78  Pulse: 87  93 84  Resp: '20  20 20  '$ Temp: 99.4 F (37.4 C)  98.3 F (36.8 C) 98.1 F (36.7 C)  TempSrc: Oral  Oral   SpO2: 91% 94% 90% 99%  Weight:      Height:        Intake/Output Summary (Last 24 hours) at 10/28/2021 0738 Last data filed at 10/28/2021 0606 Gross per 24 hour  Intake 240 ml  Output 1900 ml  Net -1660 ml   Filed Weights   10/22/21 0500 10/24/21 0330 10/26/21 0331  Weight: (!) 143 kg (!) 166 kg 123.4 kg    Examination:  Constitutional:  VS as above General Appearance: alert, well-developed, well-nourished, NAD Eyes: Normal lids and conjunctive, non-icteric sclera Ears, Nose, Mouth, Throat: Normal appearance Neck: No masses, trachea midline Trach in place, appears clean and ventilating well  Respiratory: Normal respiratory effort CTABL Cardiovascular: S1S2, RRR Musculoskeletal:  No clubbing/cyanosis of digits Neurological: No cranial nerve deficit on limited exa Psychiatric: Normal judgment/insight Calm lately Has capacity to make informed medical decisions.       Scheduled Medications:   atorvastatin  10 mg Per Tube Daily   budesonide (  PULMICORT) nebulizer solution  0.5 mg Nebulization BID   Chlorhexidine Gluconate Cloth  6 each Topical Daily   cholecalciferol  2,000 Units Per Tube Daily   enoxaparin (LOVENOX) injection  0.5 mg/kg Subcutaneous Q24H   epoetin (EPOGEN/PROCRIT) injection  20,000 Units Subcutaneous Weekly   escitalopram  5 mg Oral Daily   feeding supplement (NEPRO CARB STEADY)  1,000 mL Per Tube Q24H   feeding supplement (PROSource TF)  90 mL Per Tube TID   free water  200 mL Per Tube 6 X Daily   ipratropium-albuterol  3 mL Nebulization BID   lidocaine  1 patch Transdermal Q24H   nutrition supplement (JUVEN)  1 packet Per Tube BID BM   mouth rinse  15 mL  Mouth Rinse 4 times per day   pantoprazole sodium  40 mg Per Tube BID   potassium chloride  20 mEq Oral Daily   traZODone  50 mg Oral QHS    Continuous Infusions:  sodium chloride Stopped (10/07/21 0542)    PRN Medications:  acetaminophen, acetaminophen, albuterol, bisacodyl, clonazepam, heparin, hydrOXYzine, loperamide, nitroGLYCERIN, mouth rinse, oxyCODONE, pentafluoroprop-tetrafluoroeth, polyethylene glycol, QUEtiapine  Antimicrobials:  Anti-infectives (From admission, onward)    Start     Dose/Rate Route Frequency Ordered Stop   10/12/21 1200  cefTAZidime (FORTAZ) 2 g in sodium chloride 0.9 % 100 mL IVPB        2 g 200 mL/hr over 30 Minutes Intravenous 2 times daily 10/12/21 1122 10/17/21 0844   10/12/21 0417  ceFAZolin (ANCEF) IVPB 1 g/50 mL premix  Status:  Discontinued        1 g 100 mL/hr over 30 Minutes Intravenous 30 min pre-op 10/12/21 0417 10/16/21 0751   10/09/21 2000  ceFEPIme (MAXIPIME) 1 g in sodium chloride 0.9 % 100 mL IVPB  Status:  Discontinued        1 g 200 mL/hr over 30 Minutes Intravenous Every 24 hours 10/09/21 1404 10/09/21 1515   10/09/21 2000  cefTAZidime (FORTAZ) 1 g in sodium chloride 0.9 % 100 mL IVPB  Status:  Discontinued        1 g 200 mL/hr over 30 Minutes Intravenous Every 24 hours 10/09/21 1515 10/12/21 1122   10/06/21 1830  meropenem (MERREM) 500 mg in sodium chloride 0.9 % 100 mL IVPB  Status:  Discontinued        500 mg 200 mL/hr over 30 Minutes Intravenous Every 24 hours 10/06/21 1735 10/09/21 1403   10/06/21 1800  doxycycline (VIBRAMYCIN) 100 mg in sodium chloride 0.9 % 250 mL IVPB  Status:  Discontinued        100 mg 125 mL/hr over 120 Minutes Intravenous Every 12 hours 10/06/21 1657 10/08/21 0838   09/29/21 1000  ceFEPIme (MAXIPIME) 2 g in sodium chloride 0.9 % 100 mL IVPB        2 g 200 mL/hr over 30 Minutes Intravenous Every 12 hours 09/29/21 0812 09/30/21 2342   09/26/21 2200  ceFEPIme (MAXIPIME) 1 g in sodium chloride 0.9 % 100 mL  IVPB  Status:  Discontinued        1 g 200 mL/hr over 30 Minutes Intravenous Every 24 hours 09/26/21 1154 09/29/21 0812   09/26/21 2000  ceFEPIme (MAXIPIME) 2 g in sodium chloride 0.9 % 100 mL IVPB  Status:  Discontinued        2 g 200 mL/hr over 30 Minutes Intravenous Every 12 hours 09/26/21 0809 09/26/21 1154   09/24/21 1200  ceFEPIme (MAXIPIME) 2 g in sodium  chloride 0.9 % 100 mL IVPB  Status:  Discontinued        2 g 200 mL/hr over 30 Minutes Intravenous Every 8 hours 09/24/21 0950 09/26/21 0809   09/21/21 1430  azithromycin (ZITHROMAX) 500 mg in sodium chloride 0.9 % 250 mL IVPB        500 mg 250 mL/hr over 60 Minutes Intravenous Every 24 hours 09/21/21 1344 09/23/21 1543   09/21/21 1430  cefTRIAXone (ROCEPHIN) 2 g in sodium chloride 0.9 % 100 mL IVPB  Status:  Discontinued        2 g 200 mL/hr over 30 Minutes Intravenous Every 24 hours 09/21/21 1344 09/24/21 0922       Data Reviewed: I have personally reviewed following labs and imaging studies  CBC: Recent Labs  Lab 10/23/21 0244 10/24/21 0840 10/25/21 0344 10/26/21 0632 10/27/21 1202  WBC 7.6 6.6 7.8 8.8 6.7  NEUTROABS 4.7 4.0 4.7 6.0 4.5  HGB 8.6* 8.7* 9.1* 9.2* 9.6*  HCT 31.9* 32.0* 33.6* 34.2* 35.6*  MCV 82.2 82.1 82.2 84.2 83.4  PLT 192 156 147* 137* 270*   Basic Metabolic Panel: Recent Labs  Lab 10/23/21 0244 10/24/21 0840 10/25/21 0344 10/26/21 0632 10/27/21 1202  NA 144 141 141  142 140 142  K 3.6 3.4* 3.3*  3.4* 3.8 3.9  CL 110 105 105  106 103 106  CO2 '31 31 31  31 '$ 32 32  GLUCOSE 107* 103* 102*  103* 103* 115*  BUN 32* 24* 22  21 26* 25*  CREATININE 1.15 1.03 1.11  1.05 1.26* 1.11  CALCIUM 8.6* 8.4* 8.3*  8.5* 8.2* 8.5*  MG 1.6* 1.5* 1.7 1.8 1.7  PHOS 2.4* 3.4 4.1 4.7* 3.8   GFR: Estimated Creatinine Clearance: 95.2 mL/min (by C-G formula based on SCr of 1.11 mg/dL). Liver Function Tests: Recent Labs  Lab 10/23/21 0244 10/24/21 0840 10/25/21 0344 10/26/21 0632 10/27/21 1202   ALBUMIN 2.4* 2.5* 2.6* 2.6* 2.6*   No results for input(s): "LIPASE", "AMYLASE" in the last 168 hours. No results for input(s): "AMMONIA" in the last 168 hours. Coagulation Profile: No results for input(s): "INR", "PROTIME" in the last 168 hours. Cardiac Enzymes: No results for input(s): "CKTOTAL", "CKMB", "CKMBINDEX", "TROPONINI" in the last 168 hours. BNP (last 3 results) No results for input(s): "PROBNP" in the last 8760 hours. HbA1C: No results for input(s): "HGBA1C" in the last 72 hours. CBG: Recent Labs  Lab 10/27/21 0727 10/27/21 1209 10/27/21 1703 10/27/21 2208 10/28/21 0611  GLUCAP 82 110* 107* 115* 105*   Lipid Profile: No results for input(s): "CHOL", "HDL", "LDLCALC", "TRIG", "CHOLHDL", "LDLDIRECT" in the last 72 hours. Thyroid Function Tests: No results for input(s): "TSH", "T4TOTAL", "FREET4", "T3FREE", "THYROIDAB" in the last 72 hours. Anemia Panel: No results for input(s): "VITAMINB12", "FOLATE", "FERRITIN", "TIBC", "IRON", "RETICCTPCT" in the last 72 hours. Urine analysis:    Component Value Date/Time   COLORURINE STRAW (A) 09/19/2021 1247   APPEARANCEUR CLEAR (A) 09/19/2021 1247   APPEARANCEUR Clear 04/15/2014 1240   LABSPEC 1.009 09/19/2021 1247   LABSPEC 1.013 04/15/2014 1240   PHURINE 5.0 09/19/2021 1247   GLUCOSEU 150 (A) 09/19/2021 1247   GLUCOSEU Negative 04/15/2014 1240   HGBUR NEGATIVE 09/19/2021 1247   BILIRUBINUR NEGATIVE 09/19/2021 1247   BILIRUBINUR Negative 04/15/2014 [place] 09/19/2021 1247   PROTEINUR NEGATIVE 09/19/2021 1247   NITRITE NEGATIVE 09/19/2021 1247   LEUKOCYTESUR NEGATIVE 09/19/2021 1247   LEUKOCYTESUR Negative 04/15/2014 1240   Sepsis Labs: '@LABRCNTIP'$ (procalcitonin:4,lacticidven:4)  Recent Results (from the past 240 hour(s))  Gastrointestinal Panel by PCR , Stool     Status: None   Collection Time: 10/21/21  2:13 AM   Specimen: Stool  Result Value Ref Range Status   Campylobacter species NOT  DETECTED NOT DETECTED Final   Plesimonas shigelloides NOT DETECTED NOT DETECTED Final   Salmonella species NOT DETECTED NOT DETECTED Final   Yersinia enterocolitica NOT DETECTED NOT DETECTED Final   Vibrio species NOT DETECTED NOT DETECTED Final   Vibrio cholerae NOT DETECTED NOT DETECTED Final   Enteroaggregative E coli (EAEC) NOT DETECTED NOT DETECTED Final   Enteropathogenic E coli (EPEC) NOT DETECTED NOT DETECTED Final   Enterotoxigenic E coli (ETEC) NOT DETECTED NOT DETECTED Final   Shiga like toxin producing E coli (STEC) NOT DETECTED NOT DETECTED Final   Shigella/Enteroinvasive E coli (EIEC) NOT DETECTED NOT DETECTED Final   Cryptosporidium NOT DETECTED NOT DETECTED Final   Cyclospora cayetanensis NOT DETECTED NOT DETECTED Final   Entamoeba histolytica NOT DETECTED NOT DETECTED Final   Giardia lamblia NOT DETECTED NOT DETECTED Final   Adenovirus F40/41 NOT DETECTED NOT DETECTED Final   Astrovirus NOT DETECTED NOT DETECTED Final   Norovirus GI/GII NOT DETECTED NOT DETECTED Final   Rotavirus A NOT DETECTED NOT DETECTED Final   Sapovirus (I, II, IV, and V) NOT DETECTED NOT DETECTED Final    Comment: Performed at [facility], [address], [place], [place] [address]         Radiology Studies last 96 hours: No results found.          LOS: 57 days      [place], DO [facility] 10/28/2021, 7:38 AM   Staff may message me via secure chat in [place]  but this may not receive immediate response,  please page for urgent matters!  If 7PM-7AM, please contact night-coverage [email or web address]  Dictation software was used to generate the above note. Typos may occur and escape review, as with typed/written notes. Please contact Dr Sheppard Coil directly for clarity if needed.

## 2021-10-28 NOTE — Progress Notes (Signed)
Entered patient room during shift change. Patient noted to have right leg over bed rail and attempting to exit bed. Night shift RN Saralyn Pilar present at bedside. Patient aware he needs hoyer lift assist for mobility. Despite this patient demands he is "getting up with the hoyer" himself. Attempted to explain to patient this was not possible and assistance to get up would be provided later when staff was available to assist. Attempts made to assist patient to put leg back into bed. Patient kicked at nurse and then threatened nurse saying, "I'll knock you upside the head."  Patient refusing to take any po prn medications at this time. MD made aware.

## 2021-10-28 NOTE — Progress Notes (Signed)
Pt refusing scheduled Lidocaine patch to L chest. Pt educated on purpose of lidocaine patch but he continues to refuse. Pt seen laying in bed watching tv, respirations even & unlabored. CBWR.

## 2021-10-28 NOTE — Progress Notes (Signed)
Entered patient's room to give AM medications. Patient stated he "might take them." Explained to patient what the medications were for and that if he wanted to take them, now was the time. Patient then stated, "I need to apologize for how I was this morning. I was nasty." Assured patient mutual respect would ensure the best day and allow [place] to accomplish all our goals (such as OOB to chair). Patient agreeable. 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 Patient agreeable to taking AM meds. Will assist with OOB via hoyer this AM and continue to monitor.

## 2021-10-28 NOTE — Progress Notes (Signed)
Pt is c/o chest pain. RN attempted to administer PRN Nitroglycerin but pt refusing to take PRN until he "speaks with the nurse practitioner." Pt educated on use of SL Nitroglycerin for relief of chest pain but patient continues to adamantly refuse Nitroglycerin. On call MD notified of patient's complaints, patient's refusal of PRN Nitroglycerin, and patient's request to speak with MD.

## 2021-10-28 NOTE — Progress Notes (Signed)
Spoke with on-call MD, Dr. Sidney Ace, who recommended Morphine to help ease the patient's chest pain. Patient not agreeable to Morphine and states "I am not taking any medication. I want an EKG and chest x-ray." Informed patient that EKG was ordered and would be completed. CXR and Labs ordered as well per Dr. Sidney Ace. Patient with no apparent distress at this time.

## 2021-10-28 NOTE — Progress Notes (Signed)
Patient OOB to chair via [facility] lift for second time today, per request. Patient has been respectful towards staff since this morning.

## 2021-10-29 LAB — GLUCOSE, CAPILLARY
Glucose-Capillary: 80 mg/dL (ref 70–99)
Glucose-Capillary: 101 mg/dL — ABNORMAL HIGH (ref 70–99)
Glucose-Capillary: 134 mg/dL — ABNORMAL HIGH (ref 70–99)
Glucose-Capillary: 115 mg/dL — ABNORMAL HIGH (ref 70–99)
Glucose-Capillary: 102 mg/dL — ABNORMAL HIGH (ref 70–99)

## 2021-10-29 MED ORDER — LIDOCAINE 5 % EX PTCH
2.0000 | MEDICATED_PATCH | CUTANEOUS | Status: DC
  Administered 2021-10-30 – 2021-11-04 (×6): 2 via TRANSDERMAL
  Filled 2021-10-29 (×6): qty 2

## 2021-10-29 MED ORDER — LIDOCAINE 5 % EX PTCH
1.0000 | MEDICATED_PATCH | CUTANEOUS | Status: AC
  Administered 2021-10-29: 1 via TRANSDERMAL
  Filled 2021-10-29: qty 1

## 2021-10-29 NOTE — Progress Notes (Addendum)
PROGRESS NOTE    Gary [name]  YOV:[id number] DOB: Dec 12, 1955  DOA: 09/19/2021 Date of Service: 10/29/21 PCP: [facility], [facility] Course:  66 y/o male with multiple medical problems causing chronic hypercapneic respiratory failure who has had admissions in the past for exacerbations of the same and mechanical ventilation presented for evaluation of dyspnea, weakness. It is unclear if the patient has been compliant with his medications and NIMV at home.  He was admitted by the hospitalists for a presumed COPD exacerbation in the ER, treated with bronchodilators and NIMV but his mental status and hypercarbia worsened despite those interventions so he required intubation.   6/6: Presented to ED.  Required intubation and mechanical ventilation in the ED.  PCCM admit ICU 6/7: Hold diuresis due to worsening Creatinine.  ENT consulted for Trach placement per family request 6/8: Pt continues to spike temps tmax 101.8 degrees F.  Flexiseal placed overnight draining foul smelling watery stool~Cdiff and GI panel pending  6/9: Fever resolved.  Negative for C- diff, GI panel still pending.  Creatinine slightly worsened, requiring low dose levophed (2 mcg). Holding diuresis 6/10: propofol discontinued due hypertriglyceridemia; failed SBT 6/11: Tracheal aspirate from 6/6 resulting with Pseudomonas, ABX changed to Cefepime. neurologically intact, awaiting Trach placement 6/12: Pt calmer today following addition of oral benzo's, narcotics, and Seroquel via tube.  Creatinine worsened with decreased UOP, holding diuresis, low threshold for Nephrology consult  6/13: Pt with worsening renal failure creatinine 4.15.  UOP overnight 25 ml.  Nephrology consulted plans to start pt on hemodialysis follow dialysis catheter placement Pt underwent hemodialysis without ultrafiltration 06/14: .  Plans for HD session today.  Agitation has improved significantly with narcotics and antianxiety medications  per tube   06/16: Tracheostomy canceled 06/15 due to hyperkalemia and tentatively rescheduled for 06/20  6/18 failure to wean from vent 06/20: ENT placed Tracheostomy (#7.0 XLT cuffed).  Developed mild epistaxis post unsuccessful attempt at NGT placement.  Later developed hematochezia/melena, Protonix gtt started and GI consulted. 06/21: Diuresed yesterday with limited results. Patient had softed blood pressures throughout the day and diuresis was held as a result. HD was performed by the patient clotted off the machine and was not able to be rinsed back. Repeat hemoglobin is above 8. 06/23: Pt remains mechanically intubated settings: FiO2 50%/PEEP 10.  CXR concerning for moderate right pleural effusion with associated right basilar atelectasis vs. infiltrate.  Will perform [place] Chest to determine if pt needs right sided thoracentesis  06/24: Remains on the ventilator, FiO2 requirements decreasing, respiratory culture with abundant gram-negative rods: [name], on Meropenem 06/25: Oxygen requirements down to 40%, chest x-ray shows improvement on dense right consolidation, Pseudomonas susceptibilities pending 06/25: Pt remains on the ventilator settings: PEEP 10 and FiO2 40%; will attempt to wean PEEP to 5 to perform SBT  06/28: Pt tolerated PS on 06/27.  Overnight pt had episodes of vomiting TF's suspect secondary to malposition of nasogastric tube.  Will consult IR for PEG tube placement if unable to place PEG tube pt will need postpyloric Dobbhoff   06/29: Pt tolerated PS 5/5 for over an hour yesterday, however due to fatigue placed back on PRVC.  Pt pulled NGT out several times overnight, therefore will not attempt to reinsert.  Plans for PEG tube placement per IR on 06/30.   06/30: PEG tube placed per IR  07/1: Pt tolerated trach collar '@40'$ % throughout the day 07/2: Pt placed on ventilator overnight settings 16/5/35% tolerating well.  Will place pt on trach collar today and place orders  for PT/OT 7/3: Hemodynamically stable.  Tolerating TC (has remained on TC since 7/2 @ 08:00), plan for speech therapy and PT/OT 7/4: Has maintained on trach collar for greater than 48 hours, tolerating Passy-Muir valve.  Plan to transfer out of ICU. SLP recs: dysphagia 1 (puree) w/ nectar liquid.  7/5-7/6: sodium and creatinine trending appropriately, Hgb stable. Low K repleted and stable. Was off vent qhs (orders d/c or missed?) and was restarted. Will need LTAC, TOC following  7/7: pulled out trach overnight. Confusion re: vent plan among staff and myself. I asked PCCM to reevluate him and verify plan so the team and the patient can eb on the same page, and will help TOC arrange appropriate placement.  7/8: I spoke personally w/ Dr Milon Dikes [facility]) and he confirms patient does NOT need ventilator support, unless of course develops distress, but last night the patient tolerated well without it. Plan to meet w/ family today when they visit, I spoke w/ his sister yesterday on the phone. No concerns on CBC/BMP: Cr tending down a bit, Hgb stable, Na slightly high but improving over past few days. He is approaching medically stable and may be appropriate for LTAC vs SNF soon  7/9: Several long discussions with patient and family about medical condition, behavioral issues.  See separate progress notes.  Psychiatry evaluated, agree that he has capacity, started SSRI. 7/10: Patient remains medically stable, awaiting placement.  Discussed his behavioral issues again and encouraged him to always bring any concerns to nursing staff or to me calmly and I am happy to listen, requested he maintain professionalism towards staff, he seems amenable. 7/11-7/14: goal OOB to chair, needing hoyer lift, pt reeducated several times about staying in bed 07/11 and has been compliant through 07/14. Finally got bariatric chair and hoyer lift 7/12 and was OOB 07/13.  D/c monitoring and sitter and transferred to [facility] status 07/13.  Awaiting placement.  7/15-16- working to reorient patient, but personality and coherence remain difficult. Stable for placement. Reminded patient not to verbally abuse staff   Consultants:  PCCU Nephrology ENT GI IR Psychiatry  Procedures: 10/03/21: tracheostomy placement 10/12/21: dialysis permacath placement  10/13/21: G-tube placed    Subjective: NAEON, patient refused all interventions overnight for c/o CP Reminded to be nice to professional staff who are working to help him in a healthcare setting.     ASSESSMENT & PLAN:   Principal Problem:   Acute on chronic respiratory failure with hypoxia and hypercapnia ([name]) Active Problems:   Major depressive disorder, recurrent episode, moderate ([name])   Acute on chronic diastolic congestive heart failure ([name])   COPD with acute exacerbation ([name])   Acute metabolic encephalopathy   HTN (hypertension)   Obesity, Class III, BMI 40-49.9 (morbid obesity) ([place])   Obstructive sleep apnea   Chronic kidney disease, stage 3a ([name])   HLD (hyperlipidemia)   Pressure injury of skin    Acute on Chronic Hypoxic & Hypercapnic Respiratory Failure in the setting of acute decompensated HFpEF, AECOPD, Pseudomonas Pneumonia, & OSA/OHS Moderate right sided pleural effusion -ruled out, actually lung Consolidation --> STABLE S/p  tracheostomy by ENT by 6/20 Trach collar w/ ventilator support as needed / qhs but he does NOT need vent supoort overnight routinely per my conversation w/ Dr Milon Dikes 07/08 Completed 10 day course of ceftazidime for treatment  of Pseudomonas At this point, he is medically stable but placement is going to be an issue  for him.  I have discussed that his behavioral issues are impeding placement and his behavior has improved.    Acute decompensated HFpEF PMHx: Hypertension --> RESOLVED/STABLE  Echocardiogram 08/07/2021: LVEF 60 to 85%, grade 1 diastolic dysfunction, normal RV systolic function Was on intermittent  hemodialysis for volume removal - has not needed HD and has remained compensated. cancelled permacath placement    AKI superimposed CKD stage IIIa  Hypokalemia  Hypernatremia --> RESOLVED During admissions in February 2023, creatinine 1.8 with GFR 40; in May 2023 creatinine 1.1 with GFR of 60 Monitor I&O's / urinary output Follow BMP  Cr down, good UOP noted, d/c dialysis catheter, monitor renal parameters daily. 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 Diff negative   Acute Blood Loss Anemia --> RESOLVED Nasopharyngeal trauma vs Upper GI bleed Trend CBC  Monitor for s/sx of bleeding and transfuse for Hgb <7 Continue BID IV PPI per GI, no need for scope  nephrology recommends consider epogen as outpatient    Hyperglycemia --> STABLE CBG's achs SSI  Acute Metabolic Encephalopathy in setting of CO2 Narcosis Sedation needs in setting of mechanical ventilation --> RESOLVED CT Head 09/19/21: negative;  UDS negative Treatment of hypercapnia as outlined above  Trach in place Gtube in place Was Requiring ventilatory support at night, has done well without this Working on increasing po intake, SLP and dietary following  Has not required ventilatory support, doing well on O2 via the trach      DVT prophylaxis:lovenox Code Status: FULL Family Communication:  sister at bedside today on rounds  Disposition Plan / TOC needs: to med-surg 07/13, awaiting placement  Barriers to discharge / significant pending items: remains on tube feeds, diet advancing, remains on trach - may need LTAC, discussed his behavioral issues with him several days ago and he has been acting more professionally toward staff since then. Caryl P[facility]) to reach out for facilities which may  be further distance, pending              Objective: Vitals:   10/28/21 2217 10/29/21 0500 10/29/21 0532 10/29/21 0801  BP:   125/79 110/65  Pulse:   84 87  Resp:   20 18  Temp:   97.7 F (36.5 C) 97.9 F (36.6 C)  TempSrc:   Oral   SpO2: 94%  95% 91%  Weight:  (!) 186 kg    Height:       No intake or output data in the 24 hours ending 10/29/21 0843  Filed Weights   10/24/21 0330 10/26/21 0331 10/29/21 0500  Weight: (!) 166 kg 123.4 kg (!) 186 kg    Examination:  Constitutional:  VS as above General Appearance: alert, well-developed, well-nourished, NAD Eyes: Normal lids and conjunctive, non-icteric sclera Ears, Nose, Mouth, Throat: Normal appearance Neck: No masses, trachea midline Trach in place, appears clean and ventilating well  Respiratory: Normal respiratory effort CTABL Cardiovascular: S1S2, RRR Musculoskeletal:  No clubbing/cyanosis of digits Neurological: No cranial nerve deficit on limited exa Psychiatric: Normal judgment/insight Calm lately Has capacity to make informed medical decisions.       Scheduled Medications:   atorvastatin  10 mg Per Tube Daily   budesonide (PULMICORT) nebulizer solution  0.5 mg Nebulization BID   Chlorhexidine Gluconate Cloth  6 each Topical Daily   cholecalciferol  2,000  Units Per Tube Daily   enoxaparin (LOVENOX) injection  0.5 mg/kg Subcutaneous Q24H   epoetin (EPOGEN/PROCRIT) injection  20,000 Units Subcutaneous Weekly   escitalopram  5 mg Oral Daily   feeding supplement (NEPRO CARB STEADY)  1,000 mL Per Tube Q24H   feeding supplement (PROSource TF)  90 mL Per Tube TID   free water  200 mL Per Tube 6 X Daily   ipratropium-albuterol  3 mL Nebulization BID   lidocaine  1 patch Transdermal Q24H   nutrition supplement (JUVEN)  1 packet Per Tube BID BM   mouth rinse  15 mL Mouth Rinse 4 times per day   pantoprazole sodium  40 mg Per Tube BID   potassium chloride  20 mEq Oral Daily   traZODone  50 mg  Oral QHS    Continuous Infusions:  sodium chloride Stopped (10/07/21 0542)    PRN Medications:  acetaminophen, acetaminophen, albuterol, benzonatate, bisacodyl, clonazepam, guaiFENesin, hydrOXYzine, loperamide, nitroGLYCERIN, mouth rinse, oxyCODONE, pentafluoroprop-tetrafluoroeth, polyethylene glycol, QUEtiapine  Antimicrobials:  Anti-infectives (From admission, onward)    Start     Dose/Rate Route Frequency Ordered Stop   10/12/21 1200  cefTAZidime (FORTAZ) 2 g in sodium chloride 0.9 % 100 mL IVPB        2 g 200 mL/hr over 30 Minutes Intravenous 2 times daily 10/12/21 1122 10/17/21 0844   10/12/21 0417  ceFAZolin (ANCEF) IVPB 1 g/50 mL premix  Status:  Discontinued        1 g 100 mL/hr over 30 Minutes Intravenous 30 min pre-op 10/12/21 0417 10/16/21 0751   10/09/21 2000  ceFEPIme (MAXIPIME) 1 g in sodium chloride 0.9 % 100 mL IVPB  Status:  Discontinued        1 g 200 mL/hr over 30 Minutes Intravenous Every 24 hours 10/09/21 1404 10/09/21 1515   10/09/21 2000  cefTAZidime (FORTAZ) 1 g in sodium chloride 0.9 % 100 mL IVPB  Status:  Discontinued        1 g 200 mL/hr over 30 Minutes Intravenous Every 24 hours 10/09/21 1515 10/12/21 1122   10/06/21 1830  meropenem (MERREM) 500 mg in sodium chloride 0.9 % 100 mL IVPB  Status:  Discontinued        500 mg 200 mL/hr over 30 Minutes Intravenous Every 24 hours 10/06/21 1735 10/09/21 1403   10/06/21 1800  doxycycline (VIBRAMYCIN) 100 mg in sodium chloride 0.9 % 250 mL IVPB  Status:  Discontinued        100 mg 125 mL/hr over 120 Minutes Intravenous Every 12 hours 10/06/21 1657 10/08/21 0838   09/29/21 1000  ceFEPIme (MAXIPIME) 2 g in sodium chloride 0.9 % 100 mL IVPB        2 g 200 mL/hr over 30 Minutes Intravenous Every 12 hours 09/29/21 0812 09/30/21 2342   09/26/21 2200  ceFEPIme (MAXIPIME) 1 g in sodium chloride 0.9 % 100 mL IVPB  Status:  Discontinued        1 g 200 mL/hr over 30 Minutes Intravenous Every 24 hours 09/26/21 1154  09/29/21 0812   09/26/21 2000  ceFEPIme (MAXIPIME) 2 g in sodium chloride 0.9 % 100 mL IVPB  Status:  Discontinued        2 g 200 mL/hr over 30 Minutes Intravenous Every 12 hours 09/26/21 0809 09/26/21 1154   09/24/21 1200  ceFEPIme (MAXIPIME) 2 g in sodium chloride 0.9 % 100 mL IVPB  Status:  Discontinued        2 g 200 mL/hr over  30 Minutes Intravenous Every 8 hours 09/24/21 0950 09/26/21 0809   09/21/21 1430  azithromycin (ZITHROMAX) 500 mg in sodium chloride 0.9 % 250 mL IVPB        500 mg 250 mL/hr over 60 Minutes Intravenous Every 24 hours 09/21/21 1344 09/23/21 1543   09/21/21 1430  cefTRIAXone (ROCEPHIN) 2 g in sodium chloride 0.9 % 100 mL IVPB  Status:  Discontinued        2 g 200 mL/hr over 30 Minutes Intravenous Every 24 hours 09/21/21 1344 09/24/21 0922       Data Reviewed: I have personally reviewed following labs and imaging studies  CBC: Recent Labs  Lab 10/23/21 0244 10/24/21 0840 10/25/21 0344 10/26/21 0632 10/27/21 1202 10/28/21 1708  WBC 7.6 6.6 7.8 8.8 6.7 8.1  NEUTROABS 4.7 4.0 4.7 6.0 4.5  --   HGB 8.6* 8.7* 9.1* 9.2* 9.6* 9.6*  HCT 31.9* 32.0* 33.6* 34.2* 35.6* 35.1*  MCV 82.2 82.1 82.2 84.2 83.4 83.0  PLT 192 156 147* 137* 135* 143*    Basic Metabolic Panel: Recent Labs  Lab 10/23/21 0244 10/24/21 0840 10/25/21 0344 10/26/21 0632 10/27/21 1202 10/28/21 1708  NA 144 141 141  142 140 142 143  K 3.6 3.4* 3.3*  3.4* 3.8 3.9 4.0  CL 110 105 105  106 103 106 108  CO2 '31 31 31  31 '$ 32 32 31  GLUCOSE 107* 103* 102*  103* 103* 115* 80  BUN 32* 24* 22  21 26* 25* 34*  CREATININE 1.15 1.03 1.11  1.05 1.26* 1.11 1.17  CALCIUM 8.6* 8.4* 8.3*  8.5* 8.2* 8.5* 8.3*  MG 1.6* 1.5* 1.7 1.8 1.7  --   PHOS 2.4* 3.4 4.1 4.7* 3.8  --     GFR: Estimated Creatinine Clearance: 112.9 mL/min (by C-G formula based on SCr of 1.17 mg/dL). Liver Function Tests: Recent Labs  Lab 10/23/21 0244 10/24/21 0840 10/25/21 0344 10/26/21 0632 10/27/21 1202   ALBUMIN 2.4* 2.5* 2.6* 2.6* 2.6*    No results for input(s): "LIPASE", "AMYLASE" in the last 168 hours. No results for input(s): "AMMONIA" in the last 168 hours. Coagulation Profile: No results for input(s): "INR", "PROTIME" in the last 168 hours. Cardiac Enzymes: No results for input(s): "CKTOTAL", "CKMB", "CKMBINDEX", "TROPONINI" in the last 168 hours. BNP (last 3 results) No results for input(s): "PROBNP" in the last 8760 hours. HbA1C: No results for input(s): "HGBA1C" in the last 72 hours. CBG: Recent Labs  Lab 10/27/21 2208 10/28/21 0611 10/28/21 2112 10/29/21 0530 10/29/21 0801  GLUCAP 115* 105* 141* 80 101*    Lipid Profile: No results for input(s): "CHOL", "HDL", "LDLCALC", "TRIG", "CHOLHDL", "LDLDIRECT" in the last 72 hours. Thyroid Function Tests: No results for input(s): "TSH", "T4TOTAL", "FREET4", "T3FREE", "THYROIDAB" in the last 72 hours. Anemia Panel: No results for input(s): "VITAMINB12", "FOLATE", "FERRITIN", "TIBC", "IRON", "RETICCTPCT" in the last 72 hours. Urine analysis:    Component Value Date/Time   COLORURINE STRAW (A) 09/19/2021 1247   APPEARANCEUR CLEAR (A) 09/19/2021 1247   APPEARANCEUR Clear 04/15/2014 1240   LABSPEC 1.009 09/19/2021 1247   LABSPEC 1.013 04/15/2014 1240   PHURINE 5.0 09/19/2021 1247   GLUCOSEU 150 (A) 09/19/2021 1247   GLUCOSEU Negative 04/15/2014 1240   HGBUR NEGATIVE 09/19/2021 1247   BILIRUBINUR NEGATIVE 09/19/2021 1247   BILIRUBINUR Negative 04/15/2014 [place] 09/19/2021 1247   PROTEINUR NEGATIVE 09/19/2021 1247   NITRITE NEGATIVE 09/19/2021 1247   LEUKOCYTESUR NEGATIVE 09/19/2021 1247   LEUKOCYTESUR Negative  04/15/2014 1240   Sepsis Labs: '@LABRCNTIP'$ (procalcitonin:4,lacticidven:4)  Recent Results (from the past 240 hour(s))  Gastrointestinal Panel by PCR , Stool     Status: None   Collection Time: 10/21/21  2:13 AM   Specimen: Stool  Result Value Ref Range Status   Campylobacter species NOT  DETECTED NOT DETECTED Final   Plesimonas shigelloides NOT DETECTED NOT DETECTED Final   Salmonella species NOT DETECTED NOT DETECTED Final   Yersinia enterocolitica NOT DETECTED NOT DETECTED Final   Vibrio species NOT DETECTED NOT DETECTED Final   Vibrio cholerae NOT DETECTED NOT DETECTED Final   Enteroaggregative E coli (EAEC) NOT DETECTED NOT DETECTED Final   Enteropathogenic E coli (EPEC) NOT DETECTED NOT DETECTED Final   Enterotoxigenic E coli (ETEC) NOT DETECTED NOT DETECTED Final   Shiga like toxin producing E coli (STEC) NOT DETECTED NOT DETECTED Final   Shigella/Enteroinvasive E coli (EIEC) NOT DETECTED NOT DETECTED Final   Cryptosporidium NOT DETECTED NOT DETECTED Final   Cyclospora cayetanensis NOT DETECTED NOT DETECTED Final   Entamoeba histolytica NOT DETECTED NOT DETECTED Final   Giardia lamblia NOT DETECTED NOT DETECTED Final   Adenovirus F40/41 NOT DETECTED NOT DETECTED Final   Astrovirus NOT DETECTED NOT DETECTED Final   Norovirus GI/GII NOT DETECTED NOT DETECTED Final   Rotavirus A NOT DETECTED NOT DETECTED Final   Sapovirus (I, II, IV, and V) NOT DETECTED NOT DETECTED Final    Comment: Performed at [facility], [address] [place], [place] [address]         Radiology Studies last 96 hours: DG Chest Port 1 View  Result Date: 10/28/2021 CLINICAL DATA:  Chest pain EXAM: PORTABLE CHEST 1 VIEW COMPARISON:  Previous studies including the examination of 10/20/2021 FINDINGS: Transverse diameter heart is increased. Tip of tracheostomy is 4.3 cm above the carina. Central pulmonary vessels are prominent without signs of alveolar pulmonary edema. No focal pulmonary infiltrates are seen. There is no significant pleural effusion or pneumothorax. IMPRESSION: Cardiomegaly. Central pulmonary vessels are prominent without signs of alveolar pulmonary edema. There are no new focal infiltrates. Electronically Signed   By: Elmer Picker M.D.   On: 10/28/2021 12:27             LOS: 40 days      Tanith Dagostino, DO [facility] 10/29/2021, 8:43 AM   Staff may message me via secure chat in [place]  but this may not receive immediate response,  please page for urgent matters!  If 7PM-7AM, please contact night-coverage [email or web address]  Dictation software was used to generate the above note. Typos may occur and escape review, as with typed/written notes. Please contact Dr Sheppard Coil directly for clarity if needed.

## 2021-10-29 NOTE — Plan of Care (Signed)
  Problem: Education: Goal: Ability to demonstrate management of disease process will improve Outcome: Progressing Goal: Ability to verbalize understanding of medication therapies will improve Outcome: Progressing   Problem: Activity: Goal: Capacity to carry out activities will improve Outcome: Progressing   Problem: Cardiac: Goal: Ability to achieve and maintain adequate cardiopulmonary perfusion will improve Outcome: Progressing   Problem: Education: Goal: Knowledge of disease or condition will improve Outcome: Progressing Goal: Knowledge of the prescribed therapeutic regimen will improve Outcome: Progressing   Problem: Activity: Goal: Ability to tolerate increased activity will improve Outcome: Progressing Goal: Will verbalize the importance of balancing activity with adequate rest periods Outcome: Progressing   Problem: Respiratory: Goal: Ability to maintain a clear airway will improve Outcome: Progressing Goal: Levels of oxygenation will improve Outcome: Progressing Goal: Ability to maintain adequate ventilation will improve Outcome: Progressing   Problem: Education: Goal: Knowledge of General Education information will improve Description: Including pain rating scale, medication(s)/side effects and non-pharmacologic comfort measures Outcome: Progressing   Problem: Health Behavior/Discharge Planning: Goal: Ability to manage health-related needs will improve Outcome: Progressing   Problem: Clinical Measurements: Goal: Ability to maintain clinical measurements within normal limits will improve Outcome: Progressing Goal: Will remain free from infection Outcome: Progressing Goal: Diagnostic test results will improve Outcome: Progressing Goal: Respiratory complications will improve Outcome: Progressing Goal: Cardiovascular complication will be avoided Outcome: Progressing   Problem: Activity: Goal: Risk for activity intolerance will decrease Outcome:  Progressing   Problem: Nutrition: Goal: Adequate nutrition will be maintained Outcome: Progressing   Problem: Coping: Goal: Level of anxiety will decrease Outcome: Progressing   Problem: Elimination: Goal: Will not experience complications related to bowel motility Outcome: Progressing Goal: Will not experience complications related to urinary retention Outcome: Progressing   Problem: Pain Managment: Goal: General experience of comfort will improve Outcome: Progressing   Problem: Safety: Goal: Ability to remain free from injury will improve Outcome: Progressing   Problem: Skin Integrity: Goal: Risk for impaired skin integrity will decrease Outcome: Progressing   Problem: Education: Goal: Ability to describe self-care measures that may prevent or decrease complications (Diabetes Survival Skills Education) will improve Outcome: Progressing   Problem: Coping: Goal: Ability to adjust to condition or change in health will improve Outcome: Progressing   Problem: Fluid Volume: Goal: Ability to maintain a balanced intake and output will improve Outcome: Progressing   Problem: Health Behavior/Discharge Planning: Goal: Ability to identify and utilize available resources and services will improve Outcome: Progressing Goal: Ability to manage health-related needs will improve Outcome: Progressing   Problem: Metabolic: Goal: Ability to maintain appropriate glucose levels will improve Outcome: Progressing   Problem: Nutritional: Goal: Maintenance of adequate nutrition will improve Outcome: Progressing Goal: Progress toward achieving an optimal weight will improve Outcome: Progressing   Problem: Skin Integrity: Goal: Risk for impaired skin integrity will decrease Outcome: Progressing   Problem: Tissue Perfusion: Goal: Adequacy of tissue perfusion will improve Outcome: Progressing

## 2021-10-30 LAB — GLUCOSE, CAPILLARY
Glucose-Capillary: 101 mg/dL — ABNORMAL HIGH (ref 70–99)
Glucose-Capillary: 130 mg/dL — ABNORMAL HIGH (ref 70–99)
Glucose-Capillary: 88 mg/dL (ref 70–99)
Glucose-Capillary: 98 mg/dL (ref 70–99)

## 2021-10-30 MED ORDER — ENOXAPARIN SODIUM 100 MG/ML IJ SOSY
0.5000 mg/kg | PREFILLED_SYRINGE | INTRAMUSCULAR | Status: DC
  Administered 2021-10-30 – 2021-11-11 (×13): 95 mg via SUBCUTANEOUS
  Filled 2021-10-30 (×13): qty 0.95

## 2021-10-30 NOTE — Progress Notes (Signed)
Pt refusing vital signs and blood sugars. MD Alanda Amass made aware. Will try again later.

## 2021-10-30 NOTE — Progress Notes (Signed)
PHARMACIST - PHYSICIAN COMMUNICATION  CONCERNING:  Enoxaparin (Lovenox) for DVT Prophylaxis    RECOMMENDATION: Patient was prescribed enoxaprin '65mg'$  q24 hours for VTE prophylaxis.   Filed Weights   10/26/21 0331 10/29/21 0500 10/30/21 0500  Weight: 123.4 kg (272 lb) (!) 186 kg (410 lb) (!) 189.6 kg (418 lb)    Body mass index is 52.25 kg/m.  Estimated Creatinine Clearance: 114.1 mL/min (by C-G formula based on SCr of 1.17 mg/dL).   Based on [place] patient is candidate for enoxaparin 0.'5mg'$ /kg TBW SQ every 24 hours based on BMI being >30.   DESCRIPTION: Pharmacy has adjusted enoxaparin dose per [facility] policy.  Renal function and weight changed during this admission. Will adjust dose as recommended by policy.  Patient is now receiving enoxaparin 95 mg every 24 hours   Starlette Thurow Rodriguez-Guzman PharmD, BCPS 10/30/2021 8:08 AM

## 2021-10-30 NOTE — Progress Notes (Addendum)
PROGRESS NOTE    Gary [name]  BPZ:[id number] DOB: Feb 28, 1956  DOA: 09/19/2021 Date of Service: 10/30/21 PCP: [facility] Course:  66 y/o male with multiple medical problems causing chronic hypercapneic respiratory failure who has had admissions in the past for exacerbations of the same and mechanical ventilation presented for evaluation of dyspnea, weakness. It is unclear if the patient has been compliant with his medications and NIMV at home.  He was admitted by the hospitalists for a presumed COPD exacerbation in the ER, treated with bronchodilators and NIMV but his mental status and hypercarbia worsened despite those interventions so he required intubation.   6/6: Presented to ED.  Required intubation and mechanical ventilation in the ED.  PCCM admit ICU 6/7: Hold diuresis due to worsening Creatinine.  ENT consulted for Trach placement per family request 6/8: Pt continues to spike temps tmax 101.8 degrees F.  Flexiseal placed overnight draining foul smelling watery stool~Cdiff and GI panel pending  6/9: Fever resolved.  Negative for C- diff, GI panel still pending.  Creatinine slightly worsened, requiring low dose levophed (2 mcg). Holding diuresis 6/10: propofol discontinued due hypertriglyceridemia; failed SBT 6/11: Tracheal aspirate from 6/6 resulting with Pseudomonas, ABX changed to Cefepime. neurologically intact, awaiting Trach placement 6/12: Pt calmer today following addition of oral benzo's, narcotics, and Seroquel via tube.  Creatinine worsened with decreased UOP, holding diuresis, low threshold for Nephrology consult  6/13: Pt with worsening renal failure creatinine 4.15.  UOP overnight 25 ml.  Nephrology consulted plans to start pt on hemodialysis follow dialysis catheter placement Pt underwent hemodialysis without ultrafiltration 06/14: .  Plans for HD session today.  Agitation has improved significantly with narcotics and antianxiety medications  per tube   06/16: Tracheostomy canceled 06/15 due to hyperkalemia and tentatively rescheduled for 06/20  6/18 failure to wean from vent 06/20: ENT placed Tracheostomy (#7.0 XLT cuffed).  Developed mild epistaxis post unsuccessful attempt at NGT placement.  Later developed hematochezia/melena, Protonix gtt started and GI consulted. 06/21: Diuresed yesterday with limited results. Patient had softed blood pressures throughout the day and diuresis was held as a result. HD was performed by the patient clotted off the machine and was not able to be rinsed back. Repeat hemoglobin is above 8. 06/23: Pt remains mechanically intubated settings: FiO2 50%/PEEP 10.  CXR concerning for moderate right pleural effusion with associated right basilar atelectasis vs. infiltrate.  Will perform [place] Chest to determine if pt needs right sided thoracentesis  06/24: Remains on the ventilator, FiO2 requirements decreasing, respiratory culture with abundant gram-negative rods: [name], on Meropenem 06/25: Oxygen requirements down to 40%, chest x-ray shows improvement on dense right consolidation, Pseudomonas susceptibilities pending 06/25: Pt remains on the ventilator settings: PEEP 10 and FiO2 40%; will attempt to wean PEEP to 5 to perform SBT  06/28: Pt tolerated PS on 06/27.  Overnight pt had episodes of vomiting TF's suspect secondary to malposition of nasogastric tube.  Will consult IR for PEG tube placement if unable to place PEG tube pt will need postpyloric Dobbhoff   06/29: Pt tolerated PS 5/5 for over an hour yesterday, however due to fatigue placed back on PRVC.  Pt pulled NGT out several times overnight, therefore will not attempt to reinsert.  Plans for PEG tube placement per IR on 06/30.   06/30: PEG tube placed per IR  07/1: Pt tolerated trach collar '@40'$ % throughout the day 07/2: Pt placed on ventilator overnight settings 16/5/35% tolerating well.  Will place pt on trach collar today and place orders  for PT/OT 7/3: Hemodynamically stable.  Tolerating TC (has remained on TC since 7/2 @ 08:00), plan for speech therapy and PT/OT 7/4: Has maintained on trach collar for greater than 48 hours, tolerating Passy-Muir valve.  Plan to transfer out of ICU. SLP recs: dysphagia 1 (puree) w/ nectar liquid.  7/5-7/6: sodium and creatinine trending appropriately, Hgb stable. Low K repleted and stable. Was off vent qhs (orders d/c or missed?) and was restarted. Will need LTAC, TOC following  7/7: pulled out trach overnight. Confusion re: vent plan among staff and myself. I asked PCCM to reevluate him and verify plan so the team and the patient can eb on the same page, and will help TOC arrange appropriate placement.  7/8: I spoke personally w/ Dr Milon Dikes [facility]) and he confirms patient does NOT need ventilator support, unless of course develops distress, but last night the patient tolerated well without it. Plan to meet w/ family today when they visit, I spoke w/ his sister yesterday on the phone. No concerns on CBC/BMP: Cr tending down a bit, Hgb stable, Na slightly high but improving over past few days. He is approaching medically stable and may be appropriate for LTAC vs SNF soon  7/9: Several long discussions with patient and family about medical condition, behavioral issues.  See separate progress notes.  Psychiatry evaluated, agree that he has capacity, started SSRI. 7/10: Patient remains medically stable, awaiting placement.  Discussed his behavioral issues again and encouraged him to always bring any concerns to nursing staff or to me calmly and I am happy to listen, requested he maintain professionalism towards staff, he seems amenable. 7/11-7/14: goal OOB to chair, needing hoyer lift, pt reeducated several times about staying in bed 07/11 and has been compliant through 07/14. Finally got bariatric chair and hoyer lift 7/12 and was OOB 07/13.  D/c monitoring and sitter and transferred to [place] status 07/13.  Awaiting placement.  7/15-17- working to reorient patient, but personality and coherence remain difficult. Stable for placement. Reminded patient not to verbally abuse staff Consultants:  PCCU Nephrology ENT GI IR Psychiatry  Procedures: 10/03/21: tracheostomy placement 10/12/21: dialysis permacath placement  10/13/21: G-tube placed    Subjective: Progressing awaiting safe disposition planning per CCM   ASSESSMENT & PLAN:   Principal Problem:   Acute on chronic respiratory failure with hypoxia and hypercapnia ([name]) Active Problems:   Major depressive disorder, recurrent episode, moderate ([name])   Acute on chronic diastolic congestive heart failure ([place])   COPD with acute exacerbation ([name])   Acute metabolic encephalopathy   HTN (hypertension)   Obesity, Class III, BMI 40-49.9 (morbid obesity) ([place])   Obstructive sleep apnea   Chronic kidney disease, stage 3a ([place])   HLD (hyperlipidemia)   Pressure injury of skin    Acute on Chronic Hypoxic & Hypercapnic Respiratory Failure in the setting of acute decompensated HFpEF, AECOPD, Pseudomonas Pneumonia, & OSA/OHS Moderate right sided pleural effusion -ruled out, actually lung Consolidation --> STABLE S/p  tracheostomy by ENT by 6/20 Trach collar w/ ventilator support as needed / qhs but he does NOT need vent supoort overnight routinely per my conversation w/ Dr Milon Dikes 07/08 Completed 10 day course of ceftazidime for treatment  of Pseudomonas At this point, he is medically stable but placement is going to be an issue for him.  I have discussed that his behavioral issues are impeding placement and his behavior has improved.    Acute decompensated  HFpEF PMHx: Hypertension --> RESOLVED/STABLE  Echocardiogram 08/07/2021: LVEF 60 to 19%, grade 1 diastolic dysfunction, normal RV systolic function Was on intermittent hemodialysis for volume removal - has not needed HD and has remained compensated. cancelled permacath placement     AKI superimposed CKD stage IIIa  Hypokalemia  Hypernatremia --> RESOLVED During admissions in February 2023, creatinine 1.8 with GFR 40; in May 2023 creatinine 1.1 with GFR of 60 Monitor I&O's / urinary output Follow BMP  Cr down, good UOP noted, d/c dialysis catheter, monitor renal parameters daily. 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 Diff negative   Acute Blood Loss Anemia --> RESOLVED Nasopharyngeal trauma vs Upper GI bleed Trend CBC  Monitor for s/sx of bleeding and transfuse for Hgb <7 Continue BID IV PPI per GI, no need for scope  nephrology recommends consider epogen as outpatient    Hyperglycemia --> STABLE CBG's achs SSI  Acute Metabolic Encephalopathy in setting of CO2 Narcosis Sedation needs in setting of mechanical ventilation --> RESOLVED CT Head 09/19/21: negative;  UDS negative Treatment of hypercapnia as outlined above  Trach in place Gtube in place Was Requiring ventilatory support at night, has done well without this Working on increasing po intake, SLP and dietary following  Has not required ventilatory support, doing well on O2 via the trach      DVT prophylaxis:lovenox Code Status: FULL Family Communication:  sister at bedside today on rounds  Disposition Plan / TOC needs: to med-surg 07/13, awaiting placement  Barriers to discharge / significant pending items: remains on tube feeds, diet advancing, remains on trach - may need LTAC, discussed his behavioral issues with him several days ago and he has been acting more professionally toward staff since then. [facility]) to reach out for facilities which may be further distance, pending     Objective: Vitals:   10/29/21 2231 10/30/21 0032 10/30/21 0500 10/30/21 0606   BP:  105/73  113/76  Pulse:  88  84  Resp:  20  20  Temp:  98.6 F (37 C)  (!) 97.3 F (36.3 C)  TempSrc:    Oral  SpO2: 96% 94%  99%  Weight:   (!) 189.6 kg   Height:        Intake/Output Summary (Last 24 hours) at 10/30/2021 0741 Last data filed at 10/29/2021 2110 Gross per 24 hour  Intake 240 ml  Output --  Net 240 ml    Filed Weights   10/26/21 0331 10/29/21 0500 10/30/21 0500  Weight: 123.4 kg (!) 186 kg (!) 189.6 kg    Examination:  Constitutional:  VS as above General Appearance: alert, well-developed, well-nourished, NAD Eyes: Normal lids and conjunctive, non-icteric sclera Ears, Nose, Mouth, Throat: Normal appearance Neck: No masses, trachea midline Trach in place, appears clean and ventilating well  Respiratory: Normal respiratory effort CTABL Cardiovascular: S1S2, RRR Musculoskeletal:  No clubbing/cyanosis of digits Neurological: No cranial nerve deficit on limited exa Psychiatric: Normal judgment/insight Calm lately Has capacity to make informed medical decisions.    Scheduled Medications:   atorvastatin  10 mg Per Tube Daily   budesonide (PULMICORT) nebulizer solution  0.5 mg Nebulization BID   Chlorhexidine Gluconate Cloth  6 each Topical Daily   cholecalciferol  2,000 Units Per Tube Daily   enoxaparin (LOVENOX) injection  0.5 mg/kg Subcutaneous Q24H  epoetin (EPOGEN/PROCRIT) injection  20,000 Units Subcutaneous Weekly   escitalopram  5 mg Oral Daily   feeding supplement (NEPRO CARB STEADY)  1,000 mL Per Tube Q24H   feeding supplement (PROSource TF)  90 mL Per Tube TID   free water  200 mL Per Tube 6 X Daily   lidocaine  1 patch Transdermal Q24H   lidocaine  2 patch Transdermal Q24H   nutrition supplement (JUVEN)  1 packet Per Tube BID BM   mouth rinse  15 mL Mouth Rinse 4 times per day   pantoprazole sodium  40 mg Per Tube BID   potassium chloride  20 mEq Oral Daily   traZODone  50 mg Oral QHS    Continuous Infusions:  sodium  chloride Stopped (10/07/21 0542)    PRN Medications:  acetaminophen, acetaminophen, albuterol, benzonatate, bisacodyl, clonazepam, guaiFENesin, hydrOXYzine, loperamide, nitroGLYCERIN, mouth rinse, oxyCODONE, pentafluoroprop-tetrafluoroeth, polyethylene glycol, QUEtiapine  Antimicrobials:  Anti-infectives (From admission, onward)    Start     Dose/Rate Route Frequency Ordered Stop   10/12/21 1200  cefTAZidime (FORTAZ) 2 g in sodium chloride 0.9 % 100 mL IVPB        2 g 200 mL/hr over 30 Minutes Intravenous 2 times daily 10/12/21 1122 10/17/21 0844   10/12/21 0417  ceFAZolin (ANCEF) IVPB 1 g/50 mL premix  Status:  Discontinued        1 g 100 mL/hr over 30 Minutes Intravenous 30 min pre-op 10/12/21 0417 10/16/21 0751   10/09/21 2000  ceFEPIme (MAXIPIME) 1 g in sodium chloride 0.9 % 100 mL IVPB  Status:  Discontinued        1 g 200 mL/hr over 30 Minutes Intravenous Every 24 hours 10/09/21 1404 10/09/21 1515   10/09/21 2000  cefTAZidime (FORTAZ) 1 g in sodium chloride 0.9 % 100 mL IVPB  Status:  Discontinued        1 g 200 mL/hr over 30 Minutes Intravenous Every 24 hours 10/09/21 1515 10/12/21 1122   10/06/21 1830  meropenem (MERREM) 500 mg in sodium chloride 0.9 % 100 mL IVPB  Status:  Discontinued        500 mg 200 mL/hr over 30 Minutes Intravenous Every 24 hours 10/06/21 1735 10/09/21 1403   10/06/21 1800  doxycycline (VIBRAMYCIN) 100 mg in sodium chloride 0.9 % 250 mL IVPB  Status:  Discontinued        100 mg 125 mL/hr over 120 Minutes Intravenous Every 12 hours 10/06/21 1657 10/08/21 0838   09/29/21 1000  ceFEPIme (MAXIPIME) 2 g in sodium chloride 0.9 % 100 mL IVPB        2 g 200 mL/hr over 30 Minutes Intravenous Every 12 hours 09/29/21 0812 09/30/21 2342   09/26/21 2200  ceFEPIme (MAXIPIME) 1 g in sodium chloride 0.9 % 100 mL IVPB  Status:  Discontinued        1 g 200 mL/hr over 30 Minutes Intravenous Every 24 hours 09/26/21 1154 09/29/21 0812   09/26/21 2000  ceFEPIme (MAXIPIME) 2  g in sodium chloride 0.9 % 100 mL IVPB  Status:  Discontinued        2 g 200 mL/hr over 30 Minutes Intravenous Every 12 hours 09/26/21 0809 09/26/21 1154   09/24/21 1200  ceFEPIme (MAXIPIME) 2 g in sodium chloride 0.9 % 100 mL IVPB  Status:  Discontinued        2 g 200 mL/hr over 30 Minutes Intravenous Every 8 hours 09/24/21 0950 09/26/21 0809   09/21/21 1430  azithromycin (  ZITHROMAX) 500 mg in sodium chloride 0.9 % 250 mL IVPB        500 mg 250 mL/hr over 60 Minutes Intravenous Every 24 hours 09/21/21 1344 09/23/21 1543   09/21/21 1430  cefTRIAXone (ROCEPHIN) 2 g in sodium chloride 0.9 % 100 mL IVPB  Status:  Discontinued        2 g 200 mL/hr over 30 Minutes Intravenous Every 24 hours 09/21/21 1344 09/24/21 0922       Data Reviewed: I have personally reviewed following labs and imaging studies  CBC: Recent Labs  Lab 10/24/21 0840 10/25/21 0344 10/26/21 0632 10/27/21 1202 10/28/21 1708  WBC 6.6 7.8 8.8 6.7 8.1  NEUTROABS 4.0 4.7 6.0 4.5  --   HGB 8.7* 9.1* 9.2* 9.6* 9.6*  HCT 32.0* 33.6* 34.2* 35.6* 35.1*  MCV 82.1 82.2 84.2 83.4 83.0  PLT 156 147* 137* 135* 143*    Basic Metabolic Panel: Recent Labs  Lab 10/24/21 0840 10/25/21 0344 10/26/21 0632 10/27/21 1202 10/28/21 1708  NA 141 141  142 140 142 143  K 3.4* 3.3*  3.4* 3.8 3.9 4.0  CL 105 105  106 103 106 108  CO2 '31 31  31 '$ 32 32 31  GLUCOSE 103* 102*  103* 103* 115* 80  BUN 24* 22  21 26* 25* 34*  CREATININE 1.03 1.11  1.05 1.26* 1.11 1.17  CALCIUM 8.4* 8.3*  8.5* 8.2* 8.5* 8.3*  MG 1.5* 1.7 1.8 1.7  --   PHOS 3.4 4.1 4.7* 3.8  --     GFR: Estimated Creatinine Clearance: 114.1 mL/min (by C-G formula based on SCr of 1.17 mg/dL). Liver Function Tests: Recent Labs  Lab 10/24/21 0840 10/25/21 0344 10/26/21 0632 10/27/21 1202  ALBUMIN 2.5* 2.6* 2.6* 2.6*    No results for input(s): "LIPASE", "AMYLASE" in the last 168 hours. No results for input(s): "AMMONIA" in the last 168 hours. Coagulation  Profile: No results for input(s): "INR", "PROTIME" in the last 168 hours. Cardiac Enzymes: No results for input(s): "CKTOTAL", "CKMB", "CKMBINDEX", "TROPONINI" in the last 168 hours. BNP (last 3 results) No results for input(s): "PROBNP" in the last 8760 hours. HbA1C: No results for input(s): "HGBA1C" in the last 72 hours. CBG: Recent Labs  Lab 10/29/21 1206 10/29/21 1644 10/29/21 2113 10/30/21 0035 10/30/21 0608  GLUCAP 134* 115* 102* 88 98    Lipid Profile: No results for input(s): "CHOL", "HDL", "LDLCALC", "TRIG", "CHOLHDL", "LDLDIRECT" in the last 72 hours. Thyroid Function Tests: No results for input(s): "TSH", "T4TOTAL", "FREET4", "T3FREE", "THYROIDAB" in the last 72 hours. Anemia Panel: No results for input(s): "VITAMINB12", "FOLATE", "FERRITIN", "TIBC", "IRON", "RETICCTPCT" in the last 72 hours. Urine analysis:    Component Value Date/Time   COLORURINE STRAW (A) 09/19/2021 1247   APPEARANCEUR CLEAR (A) 09/19/2021 1247   APPEARANCEUR Clear 04/15/2014 1240   LABSPEC 1.009 09/19/2021 1247   LABSPEC 1.013 04/15/2014 1240   PHURINE 5.0 09/19/2021 1247   GLUCOSEU 150 (A) 09/19/2021 1247   GLUCOSEU Negative 04/15/2014 1240   HGBUR NEGATIVE 09/19/2021 1247   BILIRUBINUR NEGATIVE 09/19/2021 1247   BILIRUBINUR Negative 04/15/2014 1240   KETONESUR NEGATIVE 09/19/2021 1247   PROTEINUR NEGATIVE 09/19/2021 1247   NITRITE NEGATIVE 09/19/2021 1247   LEUKOCYTESUR NEGATIVE 09/19/2021 1247   LEUKOCYTESUR Negative 04/15/2014 1240   Sepsis Labs: '@LABRCNTIP'$ (procalcitonin:4,lacticidven:4)  Recent Results (from the past 240 hour(s))  Gastrointestinal Panel by PCR , Stool     Status: None   Collection Time: 10/21/21  2:13 AM   Specimen:  Stool  Result Value Ref Range Status   Campylobacter species NOT DETECTED NOT DETECTED Final   Plesimonas shigelloides NOT DETECTED NOT DETECTED Final   Salmonella species NOT DETECTED NOT DETECTED Final   Yersinia enterocolitica NOT DETECTED  NOT DETECTED Final   Vibrio species NOT DETECTED NOT DETECTED Final   Vibrio cholerae NOT DETECTED NOT DETECTED Final   Enteroaggregative E coli (EAEC) NOT DETECTED NOT DETECTED Final   Enteropathogenic E coli (EPEC) NOT DETECTED NOT DETECTED Final   Enterotoxigenic E coli (ETEC) NOT DETECTED NOT DETECTED Final   Shiga like toxin producing E coli (STEC) NOT DETECTED NOT DETECTED Final   Shigella/Enteroinvasive E coli (EIEC) NOT DETECTED NOT DETECTED Final   Cryptosporidium NOT DETECTED NOT DETECTED Final   Cyclospora cayetanensis NOT DETECTED NOT DETECTED Final   Entamoeba histolytica NOT DETECTED NOT DETECTED Final   Giardia lamblia NOT DETECTED NOT DETECTED Final   Adenovirus F40/41 NOT DETECTED NOT DETECTED Final   Astrovirus NOT DETECTED NOT DETECTED Final   Norovirus GI/GII NOT DETECTED NOT DETECTED Final   Rotavirus A NOT DETECTED NOT DETECTED Final   Sapovirus (I, II, IV, and V) NOT DETECTED NOT DETECTED Final    Comment: Performed at [facility], [address] [place], [place] [address]       Radiology Studies last 96 hours: DG Chest Port 1 View  Result Date: 10/28/2021 CLINICAL DATA:  Chest pain EXAM: PORTABLE CHEST 1 VIEW COMPARISON:  Previous studies including the examination of 10/20/2021 FINDINGS: Transverse diameter heart is increased. Tip of tracheostomy is 4.3 cm above the carina. Central pulmonary vessels are prominent without signs of alveolar pulmonary edema. No focal pulmonary infiltrates are seen. There is no significant pleural effusion or pneumothorax. IMPRESSION: Cardiomegaly. Central pulmonary vessels are prominent without signs of alveolar pulmonary edema. There are no new focal infiltrates. Electronically Signed   By: Elmer Picker M.D.   On: 10/28/2021 12:27       LOS: 41 days     Khi Mcmillen, DO [facility] 10/30/2021, 7:41 AM   Staff may message me via secure chat in [place]  but this may not receive immediate response,  please  page for urgent matters!  If 7PM-7AM, please contact night-coverage [email or web address]  Dictation software was used to generate the above note. Typos may occur and escape review, as with typed/written notes. Please contact Dr Sheppard Coil directly for clarity if needed.

## 2021-10-30 NOTE — Plan of Care (Signed)
  Problem: Education: Goal: Ability to demonstrate management of disease process will improve Outcome: Progressing Goal: Ability to verbalize understanding of medication therapies will improve Outcome: Progressing   Problem: Activity: Goal: Capacity to carry out activities will improve Outcome: Progressing   Problem: Cardiac: Goal: Ability to achieve and maintain adequate cardiopulmonary perfusion will improve Outcome: Progressing   Problem: Education: Goal: Knowledge of disease or condition will improve Outcome: Progressing Goal: Knowledge of the prescribed therapeutic regimen will improve Outcome: Progressing   Problem: Activity: Goal: Ability to tolerate increased activity will improve Outcome: Progressing Goal: Will verbalize the importance of balancing activity with adequate rest periods Outcome: Progressing   Problem: Respiratory: Goal: Ability to maintain a clear airway will improve Outcome: Progressing Goal: Levels of oxygenation will improve Outcome: Progressing Goal: Ability to maintain adequate ventilation will improve Outcome: Progressing   Problem: Education: Goal: Knowledge of General Education information will improve Description: Including pain rating scale, medication(s)/side effects and non-pharmacologic comfort measures Outcome: Progressing   Problem: Health Behavior/Discharge Planning: Goal: Ability to manage health-related needs will improve Outcome: Progressing   Problem: Clinical Measurements: Goal: Ability to maintain clinical measurements within normal limits will improve Outcome: Progressing Goal: Will remain free from infection Outcome: Progressing Goal: Diagnostic test results will improve Outcome: Progressing Goal: Respiratory complications will improve Outcome: Progressing Goal: Cardiovascular complication will be avoided Outcome: Progressing   Problem: Activity: Goal: Risk for activity intolerance will decrease Outcome:  Progressing   Problem: Nutrition: Goal: Adequate nutrition will be maintained Outcome: Progressing   Problem: Coping: Goal: Level of anxiety will decrease Outcome: Progressing   Problem: Elimination: Goal: Will not experience complications related to bowel motility Outcome: Progressing Goal: Will not experience complications related to urinary retention Outcome: Progressing   Problem: Pain Managment: Goal: General experience of comfort will improve Outcome: Progressing   Problem: Safety: Goal: Ability to remain free from injury will improve Outcome: Progressing   Problem: Skin Integrity: Goal: Risk for impaired skin integrity will decrease Outcome: Progressing   Problem: Education: Goal: Ability to describe self-care measures that may prevent or decrease complications (Diabetes Survival Skills Education) will improve Outcome: Progressing   Problem: Coping: Goal: Ability to adjust to condition or change in health will improve Outcome: Progressing   Problem: Fluid Volume: Goal: Ability to maintain a balanced intake and output will improve Outcome: Progressing   Problem: Health Behavior/Discharge Planning: Goal: Ability to identify and utilize available resources and services will improve Outcome: Progressing Goal: Ability to manage health-related needs will improve Outcome: Progressing   Problem: Metabolic: Goal: Ability to maintain appropriate glucose levels will improve Outcome: Progressing   Problem: Nutritional: Goal: Maintenance of adequate nutrition will improve Outcome: Progressing Goal: Progress toward achieving an optimal weight will improve Outcome: Progressing   Problem: Skin Integrity: Goal: Risk for impaired skin integrity will decrease Outcome: Progressing   Problem: Tissue Perfusion: Goal: Adequacy of tissue perfusion will improve Outcome: Progressing

## 2021-10-30 NOTE — Progress Notes (Signed)
Pt has been refusing water flushes or anything through his tube. Pt educated on the importance of flushing the tube but still refusing. MD made aware.

## 2021-10-30 NOTE — Progress Notes (Signed)
Physical Therapy Treatment Patient Details Name: Gary [name] MRN: [id number] DOB: October 14, 1955 Today's Date: 10/30/2021   History of Present Illness Patient was admitted secondary to SOB, COPD exacerbation, acute metabolic encephalopathy, and acute on chronic hypoxic and hypercapnic respiratory failure in setting of acute decompensated HFpEF and AECOPD requiring intubation and mechanical ventilation. The pt is now s/p tracheostomy and PEG tube placement 11/05/21.    PT Comments    Pt was pleasant and motivated to participate during the session and put forth good effort throughout. Pt able to complete sit to supine w/ minA for trunk control but with HOB elevated and pt rolled onto side; he is able to move LE's to EOB. Supine<>sit required +64moA for trunk control and leg management. Pt able to complete 4 STS w/ +2 modA to initiate stand and for steadying once up; able to maintain stand for ~20-30 secs and needing rest between stands. Continues to be able to initiate stand with good effort while maintaining stand with better upright posture but still very reliant on UE support to RW and physical assist. Attempted sidesteps to EOB but unable this session; observable efforts in LE's to advance but ultimately unable. Pt will benefit from PT services in a SNF setting upon discharge to safely address deficits listed in patient problem list for decreased caregiver assistance and eventual return to PLOF.    Recommendations for follow up therapy are one component of a multi-disciplinary discharge planning process, led by the attending physician.  Recommendations may be updated based on patient status, additional functional criteria and insurance authorization.  Follow Up Recommendations  Skilled nursing-short term rehab (<3 hours/day) Can patient physically be transported by private vehicle: No   Assistance Recommended at Discharge Frequent or constant Supervision/Assistance  Patient can return home with  the following Two people to help with walking and/or transfers;Two people to help with bathing/dressing/bathroom;Assistance with cooking/housework;Assist for transportation;Help with stairs or ramp for entrance   Equipment Recommendations  Other (comment) (TBD)    Recommendations for Other Services       Precautions / Restrictions Precautions Precautions: Fall Precaution Comments: IJ, G tube, trach Restrictions Weight Bearing Restrictions: No     Mobility  Bed Mobility   Bed Mobility: Supine to Sit, Sit to Supine, Rolling Rolling: Mod assist   Supine to sit: HOB elevated, Min assist Sit to supine: Mod assist, +2 for physical assistance   General bed mobility comments: able to move LEs to EOB and required minA to sit EOB w/ HOB of bed elevated. +2 modA needed for trunk control and leg management moving sit<>supine    Transfers Overall transfer level: Needs assistance Equipment used: Rolling walker (2 wheels) Transfers: Sit to/from Stand Sit to Stand: From elevated surface, Mod assist, +2 physical assistance           General transfer comment: able to complete 4 STS w/ +242mo; improvement with initiating, clearance, and upright posture. able to hold stand for  atleast ~20-30sec    Ambulation/Gait               General Gait Details: attempted sidestepping but unable this session; effort to move LLE observed but ultimately unable to advance   Stairs             Wheelchair Mobility    Modified Rankin (Stroke Patients Only)       Balance Overall balance assessment: Needs assistance Sitting-balance support: Bilateral upper extremity supported, Feet supported Sitting balance-[name] Scale: [place]   Standing  balance support: Bilateral upper extremity supported, During functional activity, Reliant on assistive device for balance Standing balance-[name] Scale: Poor                              Cognition Arousal/Alertness:  Awake/alert Behavior During Therapy: WFL for tasks assessed/performed Overall Cognitive Status: No family/caregiver present to determine baseline cognitive functioning                                          Exercises      General Comments        Pertinent Vitals/Pain Pain Assessment Pain Assessment: No/denies pain    Home Living                          Prior Function            PT Goals (current goals can now be found in the care plan section) Progress towards PT goals: Progressing toward goals    Frequency    Min 2X/week      PT Plan Current plan remains appropriate    Co-evaluation              AM-PAC PT "6 Clicks" Mobility   Outcome Measure  Help needed turning from your back to your side while in a flat bed without using bedrails?: A Lot Help needed moving from lying on your back to sitting on the side of a flat bed without using bedrails?: A Lot Help needed moving to and from a bed to a chair (including a wheelchair)?: A Lot Help needed standing up from a chair using your arms (e.g., wheelchair or bedside chair)?: A Lot Help needed to walk in hospital room?: Total Help needed climbing 3-5 steps with a railing? : Total 6 Click Score: 10    End of Session Equipment Utilized During Treatment: Gait belt Activity Tolerance: Patient tolerated treatment well Patient left: in bed;with call bell/phone within reach;with nursing/sitter in room; no bed alarm on specialty bed Nurse Communication: Mobility status PT Visit Diagnosis: Unsteadiness on feet (R26.81);Muscle weakness (generalized) (M62.81);Difficulty in walking, not elsewhere classified (R26.2)     Time: 5885-0277 PT Time Calculation (min) (ACUTE ONLY): 39 min  Charges:                        Turner Daniels, SPT  10/30/2021, 4:13 PM

## 2021-10-30 NOTE — Progress Notes (Signed)
Patient trach ties are loose and patient refuses to have ties tightened. Patient educated on importance of securing trach and ties properly, patient still refused to have ties tightened. All other trach care done at this time, no complications.

## 2021-10-31 DIAGNOSIS — J9622 Acute and chronic respiratory failure with hypercapnia: Secondary | ICD-10-CM | POA: Diagnosis not present

## 2021-10-31 DIAGNOSIS — J9621 Acute and chronic respiratory failure with hypoxia: Secondary | ICD-10-CM | POA: Diagnosis not present

## 2021-10-31 LAB — CBC
HCT: 34.4 % — ABNORMAL LOW (ref 39.0–52.0)
Hemoglobin: 9.2 g/dL — ABNORMAL LOW (ref 13.0–17.0)
MCH: 22.8 pg — ABNORMAL LOW (ref 26.0–34.0)
MCHC: 26.7 g/dL — ABNORMAL LOW (ref 30.0–36.0)
MCV: 85.4 fL (ref 80.0–100.0)
Platelets: 136 10*3/uL — ABNORMAL LOW (ref 150–400)
RBC: 4.03 MIL/uL — ABNORMAL LOW (ref 4.22–5.81)
RDW: 18 % — ABNORMAL HIGH (ref 11.5–15.5)
WBC: 7.5 10*3/uL (ref 4.0–10.5)
nRBC: 0.4 % — ABNORMAL HIGH (ref 0.0–0.2)

## 2021-10-31 LAB — GLUCOSE, CAPILLARY
Glucose-Capillary: 103 mg/dL — ABNORMAL HIGH (ref 70–99)
Glucose-Capillary: 113 mg/dL — ABNORMAL HIGH (ref 70–99)
Glucose-Capillary: 116 mg/dL — ABNORMAL HIGH (ref 70–99)
Glucose-Capillary: 120 mg/dL — ABNORMAL HIGH (ref 70–99)
Glucose-Capillary: 94 mg/dL (ref 70–99)

## 2021-10-31 MED ORDER — MUSCLE RUB 10-15 % EX CREA
TOPICAL_CREAM | CUTANEOUS | Status: DC | PRN
  Administered 2021-11-15 – 2021-12-05 (×5): 1 via TOPICAL
  Filled 2021-10-31 (×5): qty 85

## 2021-10-31 NOTE — Progress Notes (Signed)
In PROGRESS NOTE    Gary [name]  UEA:[id number] DOB: 1956/02/05  DOA: 09/19/2021 Date of Service: 10/31/21 PCP: [name] Course:  66 y/o male with multiple medical problems causing chronic hypercapneic respiratory failure who has had admissions in the past for exacerbations of the same and mechanical ventilation presented for evaluation of dyspnea, weakness. It is unclear if the patient has been compliant with his medications and NIMV at home.  He was admitted by the hospitalists for a presumed COPD exacerbation in the ER, treated with bronchodilators and NIMV but his mental status and hypercarbia worsened despite those interventions so he required intubation.   6/6: Presented to ED.  Required intubation and mechanical ventilation in the ED.  PCCM admit ICU 6/7: Hold diuresis due to worsening Creatinine.  ENT consulted for Trach placement per family request 6/8: Pt continues to spike temps tmax 101.8 degrees F.  Flexiseal placed overnight draining foul smelling watery stool~Cdiff and GI panel pending  6/9: Fever resolved.  Negative for C- diff, GI panel still pending.  Creatinine slightly worsened, requiring low dose levophed (2 mcg). Holding diuresis 6/10: propofol discontinued due hypertriglyceridemia; failed SBT 6/11: Tracheal aspirate from 6/6 resulting with Pseudomonas, ABX changed to Cefepime. neurologically intact, awaiting Trach placement 6/12: Pt calmer today following addition of oral benzo's, narcotics, and Seroquel via tube.  Creatinine worsened with decreased UOP, holding diuresis, low threshold for Nephrology consult  6/13: Pt with worsening renal failure creatinine 4.15.  UOP overnight 25 ml.  Nephrology consulted plans to start pt on hemodialysis follow dialysis catheter placement Pt underwent hemodialysis without ultrafiltration 06/14: .  Plans for HD session today.  Agitation has improved significantly with narcotics and antianxiety medications per  tube   06/16: Tracheostomy canceled 06/15 due to hyperkalemia and tentatively rescheduled for 06/20  6/18 failure to wean from vent 06/20: ENT placed Tracheostomy (#7.0 XLT cuffed).  Developed mild epistaxis post unsuccessful attempt at NGT placement.  Later developed hematochezia/melena, Protonix gtt started and GI consulted. 06/21: Diuresed yesterday with limited results. Patient had softed blood pressures throughout the day and diuresis was held as a result. HD was performed by the patient clotted off the machine and was not able to be rinsed back. Repeat hemoglobin is above 8. 06/23: Pt remains mechanically intubated settings: FiO2 50%/PEEP 10.  CXR concerning for moderate right pleural effusion with associated right basilar atelectasis vs. infiltrate.  Will perform [place] Chest to determine if pt needs right sided thoracentesis  06/24: Remains on the ventilator, FiO2 requirements decreasing, respiratory culture with abundant gram-negative rods: [name], on Meropenem 06/25: Oxygen requirements down to 40%, chest x-ray shows improvement on dense right consolidation, Pseudomonas susceptibilities pending 06/25: Pt remains on the ventilator settings: PEEP 10 and FiO2 40%; will attempt to wean PEEP to 5 to perform SBT  06/28: Pt tolerated PS on 06/27.  Overnight pt had episodes of vomiting TF's suspect secondary to malposition of nasogastric tube.  Will consult IR for PEG tube placement if unable to place PEG tube pt will need postpyloric Dobbhoff   06/29: Pt tolerated PS 5/5 for over an hour yesterday, however due to fatigue placed back on PRVC.  Pt pulled NGT out several times overnight, therefore will not attempt to reinsert.  Plans for PEG tube placement per IR on 06/30.   06/30: PEG tube placed per IR  07/1: Pt tolerated trach collar '@40'$ % throughout the day 07/2: Pt placed on ventilator overnight settings 16/5/35% tolerating well.  Will place pt on trach collar today and place orders for  PT/OT 7/3: Hemodynamically stable.  Tolerating TC (has remained on TC since 7/2 @ 08:00), plan for speech therapy and PT/OT 7/4: Has maintained on trach collar for greater than 48 hours, tolerating Passy-Muir valve.  Plan to transfer out of ICU. SLP recs: dysphagia 1 (puree) w/ nectar liquid.  7/5-7/6: sodium and creatinine trending appropriately, Hgb stable. Low K repleted and stable. Was off vent qhs (orders d/c or missed?) and was restarted. Will need LTAC, TOC following  7/7: pulled out trach overnight. Confusion re: vent plan among staff and myself. I asked PCCM to reevluate him and verify plan so the team and the patient can eb on the same page, and will help TOC arrange appropriate placement.  7/8: I spoke personally w/ Dr Milon Dikes [facility]) and he confirms patient does NOT need ventilator support, unless of course develops distress, but last night the patient tolerated well without it. Plan to meet w/ family today when they visit, I spoke w/ his sister yesterday on the phone. No concerns on CBC/BMP: Cr tending down a bit, Hgb stable, Na slightly high but improving over past few days. He is approaching medically stable and may be appropriate for LTAC vs SNF soon  7/9: Several long discussions with patient and family about medical condition, behavioral issues.  See separate progress notes.  Psychiatry evaluated, agree that he has capacity, started SSRI. 7/10: Patient remains medically stable, awaiting placement.  Discussed his behavioral issues again and encouraged him to always bring any concerns to nursing staff or to me calmly and I am happy to listen, requested he maintain professionalism towards staff, he seems amenable. 7/11-7/14: goal OOB to chair, needing hoyer lift, pt reeducated several times about staying in bed 07/11 and has been compliant through 07/14. Finally got bariatric chair and hoyer lift 7/12 and was OOB 07/13.  D/c monitoring and sitter and transferred to [place] status 07/13. Awaiting  placement.  7/15-17- working to reorient patient, but personality and coherence remain difficult. Stable for placement. Reminded patient not to verbally abuse staff  Consultants:  PCCU Nephrology ENT GI IR Psychiatry  Procedures: 10/03/21: tracheostomy placement 10/12/21: dialysis permacath placement  10/13/21: G-tube placed  Subjective: No events overnight.  Patient resting comfortably bed but asking when he is able to be discharged.   ASSESSMENT & PLAN:   Principal Problem:   Acute on chronic respiratory failure with hypoxia and hypercapnia ([name]) Active Problems:   Major depressive disorder, recurrent episode, moderate ([name])   Acute on chronic diastolic congestive heart failure ([name])   COPD with acute exacerbation ([name])   Acute metabolic encephalopathy   HTN (hypertension)   Obesity, Class III, BMI 40-49.9 (morbid obesity) ([place])   Obstructive sleep apnea   Chronic kidney disease, stage 3a ([name])   HLD (hyperlipidemia)   Pressure injury of skin    Acute on Chronic Hypoxic & Hypercapnic Respiratory Failure in the setting of acute decompensated HFpEF, AECOPD, Pseudomonas Pneumonia, & OSA/OHS Moderate right sided pleural effusion -ruled out, actually lung Consolidation --> STABLE S/p  tracheostomy by ENT by 6/20 Trach collar w/ ventilator support as needed / qhs but he does NOT need vent supoort overnight routinely per conversation w/ Dr Milon Dikes 07/08 Completed 10 day course of ceftazidime for treatment of Pseudomonas At this point, he is medically stable but placement is going to be an issue for him.  His behavioral issues have been discussed with him and his behavior has improved  Acute decompensated HFpEF PMHx: Hypertension --> RESOLVED/STABLE  Echocardiogram 08/07/2021: LVEF 60 to 12%, grade 1 diastolic dysfunction, normal RV systolic function Was on intermittent hemodialysis for volume removal - has not needed HD and has remained compensated. Cancelled permacath  placement    AKI superimposed CKD stage IIIa  Hypokalemia  Hypernatremia --> RESOLVED During admissions in February 2023, creatinine 1.8 with GFR 40; in May 2023 creatinine 1.1 with GFR of 60 Monitor I&O's / urinary output Follow BMP  Cr down, good UOP noted, d/c dialysis catheter, monitor renal parameters daily. 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 Diff negative   Acute Blood Loss Anemia --> RESOLVED Nasopharyngeal trauma vs Upper GI bleed Trend CBC  Monitor for s/sx of bleeding and transfuse for Hgb <7 Continue BID IV PPI per GI, no need for scope    Hyperglycemia --> STABLE CBG's achs SSI  Acute Metabolic Encephalopathy in setting of CO2 Narcosis Sedation needs in setting of mechanical ventilation --> RESOLVED CT Head 09/19/21: negative;  UDS negative Treatment of hypercapnia as outlined above  Trach in place Gtube in place Was Requiring ventilatory support at night, has done well without this Working on increasing po intake, SLP and dietary following  Has not required ventilatory support, doing well on O2 via the trach Continues to eat well; G-tube will have to remain in place at least 3 months prior to removal   DVT prophylaxis:lovenox Code Status: FULL Disposition Plan / TOC needs: to med-surg 07/13, awaiting placement  Barriers to discharge / significant pending items: remains on trach; behavioral issues  Objective: Vitals:   10/31/21 0451 10/31/21 0745 10/31/21 0748 10/31/21 0914  BP: 122/77   119/76  Pulse: 90  89 81  Resp:   (!) 24 (!) 22  Temp: 98.4 F (36.9 C)   98.7 F (37.1 C)  TempSrc: Oral     SpO2: 91% (!) 74% 91% 97%  Weight:      Height:        Intake/Output Summary (Last 24 hours) at 10/31/2021 1655 Last data filed at  10/31/2021 1430 Gross per 24 hour  Intake 300 ml  Output 1000 ml  Net -700 ml    Filed Weights   10/26/21 0331 10/29/21 0500 10/30/21 0500  Weight: 123.4 kg (!) 186 kg (!) 189.6 kg   Physical Exam: Physical Exam Constitutional:      General: He is not in acute distress.    Appearance: Normal appearance. He is obese. He is not ill-appearing.  HENT:     Head: Normocephalic and atraumatic.     Mouth/Throat:     Mouth: Mucous membranes are moist.  Eyes:     Extraocular Movements: Extraocular movements intact.  Neck:     Comments: Trach in place Pulmonary:     Effort: No respiratory distress.     Breath sounds: Normal breath sounds.  Abdominal:     General: Bowel sounds are normal. There is no distension.     Palpations: Abdomen is soft.  Musculoskeletal:        General: Normal range of motion.     Cervical back: Normal range of motion.  Skin:    General: Skin is warm.  Neurological:     General: No focal deficit present.     Mental Status: He is alert.  Psychiatric:  Mood and Affect: Mood normal.     Scheduled Medications:   atorvastatin  10 mg Per Tube Daily   budesonide (PULMICORT) nebulizer solution  0.5 mg Nebulization BID   Chlorhexidine Gluconate Cloth  6 each Topical Daily   cholecalciferol  2,000 Units Per Tube Daily   enoxaparin (LOVENOX) injection  0.5 mg/kg Subcutaneous Q24H   escitalopram  5 mg Oral Daily   feeding supplement (NEPRO CARB STEADY)  1,000 mL Per Tube Q24H   feeding supplement (PROSource TF)  90 mL Per Tube TID   free water  200 mL Per Tube 6 X Daily   lidocaine  2 patch Transdermal Q24H   nutrition supplement (JUVEN)  1 packet Per Tube BID BM   mouth rinse  15 mL Mouth Rinse 4 times per day   pantoprazole sodium  40 mg Per Tube BID   potassium chloride  20 mEq Oral Daily   traZODone  50 mg Oral QHS    Continuous Infusions:  sodium chloride Stopped (10/07/21 0542)    PRN Medications:  acetaminophen, acetaminophen, albuterol,  benzonatate, bisacodyl, clonazepam, guaiFENesin, hydrOXYzine, loperamide, nitroGLYCERIN, mouth rinse, oxyCODONE, pentafluoroprop-tetrafluoroeth, polyethylene glycol, QUEtiapine  Antimicrobials:  Anti-infectives (From admission, onward)    Start     Dose/Rate Route Frequency Ordered Stop   10/12/21 1200  cefTAZidime (FORTAZ) 2 g in sodium chloride 0.9 % 100 mL IVPB        2 g 200 mL/hr over 30 Minutes Intravenous 2 times daily 10/12/21 1122 10/17/21 0844   10/12/21 0417  ceFAZolin (ANCEF) IVPB 1 g/50 mL premix  Status:  Discontinued        1 g 100 mL/hr over 30 Minutes Intravenous 30 min pre-op 10/12/21 0417 10/16/21 0751   10/09/21 2000  ceFEPIme (MAXIPIME) 1 g in sodium chloride 0.9 % 100 mL IVPB  Status:  Discontinued        1 g 200 mL/hr over 30 Minutes Intravenous Every 24 hours 10/09/21 1404 10/09/21 1515   10/09/21 2000  cefTAZidime (FORTAZ) 1 g in sodium chloride 0.9 % 100 mL IVPB  Status:  Discontinued        1 g 200 mL/hr over 30 Minutes Intravenous Every 24 hours 10/09/21 1515 10/12/21 1122   10/06/21 1830  meropenem (MERREM) 500 mg in sodium chloride 0.9 % 100 mL IVPB  Status:  Discontinued        500 mg 200 mL/hr over 30 Minutes Intravenous Every 24 hours 10/06/21 1735 10/09/21 1403   10/06/21 1800  doxycycline (VIBRAMYCIN) 100 mg in sodium chloride 0.9 % 250 mL IVPB  Status:  Discontinued        100 mg 125 mL/hr over 120 Minutes Intravenous Every 12 hours 10/06/21 1657 10/08/21 0838   09/29/21 1000  ceFEPIme (MAXIPIME) 2 g in sodium chloride 0.9 % 100 mL IVPB        2 g 200 mL/hr over 30 Minutes Intravenous Every 12 hours 09/29/21 0812 09/30/21 2342   09/26/21 2200  ceFEPIme (MAXIPIME) 1 g in sodium chloride 0.9 % 100 mL IVPB  Status:  Discontinued        1 g 200 mL/hr over 30 Minutes Intravenous Every 24 hours 09/26/21 1154 09/29/21 0812   09/26/21 2000  ceFEPIme (MAXIPIME) 2 g in sodium chloride 0.9 % 100 mL IVPB  Status:  Discontinued        2 g 200 mL/hr over 30  Minutes Intravenous Every 12 hours 09/26/21 0809 09/26/21 1154   09/24/21 1200  ceFEPIme (MAXIPIME) 2 g in sodium chloride 0.9 % 100 mL IVPB  Status:  Discontinued        2 g 200 mL/hr over 30 Minutes Intravenous Every 8 hours 09/24/21 0950 09/26/21 0809   09/21/21 1430  azithromycin (ZITHROMAX) 500 mg in sodium chloride 0.9 % 250 mL IVPB        500 mg 250 mL/hr over 60 Minutes Intravenous Every 24 hours 09/21/21 1344 09/23/21 1543   09/21/21 1430  cefTRIAXone (ROCEPHIN) 2 g in sodium chloride 0.9 % 100 mL IVPB  Status:  Discontinued        2 g 200 mL/hr over 30 Minutes Intravenous Every 24 hours 09/21/21 1344 09/24/21 0922       Data Reviewed: I have personally reviewed following labs and imaging studies  CBC: Recent Labs  Lab 10/25/21 0344 10/26/21 0632 10/27/21 1202 10/28/21 1708 10/31/21 0519  WBC 7.8 8.8 6.7 8.1 7.5  NEUTROABS 4.7 6.0 4.5  --   --   HGB 9.1* 9.2* 9.6* 9.6* 9.2*  HCT 33.6* 34.2* 35.6* 35.1* 34.4*  MCV 82.2 84.2 83.4 83.0 85.4  PLT 147* 137* 135* 143* 136*    Basic Metabolic Panel: Recent Labs  Lab 10/25/21 0344 10/26/21 0632 10/27/21 1202 10/28/21 1708  NA 141  142 140 142 143  K 3.3*  3.4* 3.8 3.9 4.0  CL 105  106 103 106 108  CO2 31  31 32 32 31  GLUCOSE 102*  103* 103* 115* 80  BUN 22  21 26* 25* 34*  CREATININE 1.11  1.05 1.26* 1.11 1.17  CALCIUM 8.3*  8.5* 8.2* 8.5* 8.3*  MG 1.7 1.8 1.7  --   PHOS 4.1 4.7* 3.8  --     GFR: Estimated Creatinine Clearance: 114.1 mL/min (by C-G formula based on SCr of 1.17 mg/dL). Liver Function Tests: Recent Labs  Lab 10/25/21 0344 10/26/21 0632 10/27/21 1202  ALBUMIN 2.6* 2.6* 2.6*    No results for input(s): "LIPASE", "AMYLASE" in the last 168 hours. No results for input(s): "AMMONIA" in the last 168 hours. Coagulation Profile: No results for input(s): "INR", "PROTIME" in the last 168 hours. Cardiac Enzymes: No results for input(s): "CKTOTAL", "CKMB", "CKMBINDEX", "TROPONINI" in the  last 168 hours. BNP (last 3 results) No results for input(s): "PROBNP" in the last 8760 hours. HbA1C: No results for input(s): "HGBA1C" in the last 72 hours. CBG: Recent Labs  Lab 10/30/21 2017 10/31/21 0047 10/31/21 0840 10/31/21 1211 10/31/21 1617  GLUCAP 130* 120* 94 113* 103*    Lipid Profile: No results for input(s): "CHOL", "HDL", "LDLCALC", "TRIG", "CHOLHDL", "LDLDIRECT" in the last 72 hours. Thyroid Function Tests: No results for input(s): "TSH", "T4TOTAL", "FREET4", "T3FREE", "THYROIDAB" in the last 72 hours. Anemia Panel: No results for input(s): "VITAMINB12", "FOLATE", "FERRITIN", "TIBC", "IRON", "RETICCTPCT" in the last 72 hours. Urine analysis:    Component Value Date/Time   COLORURINE STRAW (A) 09/19/2021 1247   APPEARANCEUR CLEAR (A) 09/19/2021 1247   APPEARANCEUR Clear 04/15/2014 1240   LABSPEC 1.009 09/19/2021 1247   LABSPEC 1.013 04/15/2014 1240   PHURINE 5.0 09/19/2021 1247   GLUCOSEU 150 (A) 09/19/2021 1247   GLUCOSEU Negative 04/15/2014 1240   HGBUR NEGATIVE 09/19/2021 1247   BILIRUBINUR NEGATIVE 09/19/2021 1247   BILIRUBINUR Negative 04/15/2014 1240   KETONESUR NEGATIVE 09/19/2021 1247   PROTEINUR NEGATIVE 09/19/2021 1247   NITRITE NEGATIVE 09/19/2021 1247   LEUKOCYTESUR NEGATIVE 09/19/2021 1247   LEUKOCYTESUR Negative 04/15/2014 1240   Sepsis Labs: '@LABRCNTIP'$ (procalcitonin:4,lacticidven:4)  No results found for this or any previous visit (from the past 240 hour(s)).      Radiology Studies last 96 hours: DG Chest Port 1 View  Result Date: 10/28/2021 CLINICAL DATA:  Chest pain EXAM: PORTABLE CHEST 1 VIEW COMPARISON:  Previous studies including the examination of 10/20/2021 FINDINGS: Transverse diameter heart is increased. Tip of tracheostomy is 4.3 cm above the carina. Central pulmonary vessels are prominent without signs of alveolar pulmonary edema. No focal pulmonary infiltrates are seen. There is no significant pleural effusion or  pneumothorax. IMPRESSION: Cardiomegaly. Central pulmonary vessels are prominent without signs of alveolar pulmonary edema. There are no new focal infiltrates. Electronically Signed   By: Elmer Picker M.D.   On: 10/28/2021 12:27       LOS: 42 days     Dwyane Dee, MD [facility] 10/31/2021, 4:55 PM    If 7PM-7AM, please contact night-coverage [email or web address]

## 2021-10-31 NOTE — TOC Progression Note (Addendum)
Transition of Care [facility]) - Progression Note    Patient Details  Name: Gary [name] MRN: [id number] Date of Birth: [birth year]  Transition of Care [facility]) CM/SW Contact  Eileen Stanford, LCSW Phone Number: 10/31/2021, 11:42 AM  Clinical Narrative:  Janett Billow at [facility] states they can not take pt. CSW left voicemail with [name] to determine if there were any other places he knew of.  Pt remains on DTP list.   Expected Discharge Plan: Long Term Acute Care (LTAC) Barriers to Discharge: Continued Medical Work up  Expected Discharge Plan and Services Expected Discharge Plan: [place] (LTAC)   Discharge Planning Services: CM Consult Post Acute Care Choice: Long Term Acute Care (LTAC) Living arrangements for the past 2 months: Single Family Home                 DME Arranged: N/A DME Agency: NA       HH Arranged: NA HH Agency: NA         Social Determinants of Health (SDOH) Interventions    Readmission Risk Interventions    05/04/2021    1:26 PM 04/07/2021    1:39 PM 03/12/2021   11:54 AM  Readmission Risk Prevention Plan  Transportation Screening Complete Complete Complete  PCP or Specialist Appt within 3-5 Days  Complete Complete  HRI or [place]   Complete  Social Work Consult for [place] Planning/Counseling   Complete  Palliative Care Screening  Not Applicable Not Applicable  Medication Review [employer]) Complete Complete Complete  PCP or Specialist appointment within 3-5 days of discharge Complete    HRI or [place] Complete    SW Recovery Care/Counseling Consult Complete    [place] Not Applicable

## 2021-10-31 NOTE — Progress Notes (Signed)
Physical Therapy Treatment Patient Details Name: Gary [name] MRN: [id number] DOB: June 11, 1955 Today's Date: 10/31/2021   History of Present Illness Patient was admitted secondary to SOB, COPD exacerbation, acute metabolic encephalopathy, and acute on chronic hypoxic and hypercapnic respiratory failure in setting of acute decompensated HFpEF and AECOPD requiring intubation and mechanical ventilation. The pt is now s/p tracheostomy and PEG tube placement 11/05/21.    PT Comments    Pt was pleasant and motivated to participate during the session and put forth good effort throughout. Pt able to complete sit<>supine with minA for trunk control with HOB elevated but needs cuing for hand placement; he is able to advance LE's to EOB. Pt able to complete x4 STS w/ modA +2 to initiate stand and with cuing for hand and foot placement. Demonstrates improved ability to maintain stand w/o physical assist but still only for about ~30sec and needing rest between stands. Pt was able to sidestep to [facility] with +2 modA for steadying and cuing for sequencing. Has improved ability to advance BLE and able to clear feet from ground. Pt will benefit from PT services in a SNF setting upon discharge to safely address deficits listed in patient problem list for decreased caregiver assistance and eventual return to PLOF.    Recommendations for follow up therapy are one component of a multi-disciplinary discharge planning process, led by the attending physician.  Recommendations may be updated based on patient status, additional functional criteria and insurance authorization.  Follow Up Recommendations  Skilled nursing-short term rehab (<3 hours/day) Can patient physically be transported by private vehicle: No   Assistance Recommended at Discharge Frequent or constant Supervision/Assistance  Patient can return home with the following Two people to help with walking and/or transfers;Two people to help with  bathing/dressing/bathroom;Assistance with cooking/housework;Assist for transportation;Help with stairs or ramp for entrance   Equipment Recommendations  Other (comment) (TBD)    Recommendations for Other Services       Precautions / Restrictions Precautions Precautions: Fall Precaution Comments: IJ, G tube, trach Restrictions Weight Bearing Restrictions: No     Mobility  Bed Mobility Overal bed mobility: Needs Assistance Bed Mobility: Supine to Sit, Sit to Supine     Supine to sit: Min assist, HOB elevated Sit to supine: Mod assist, +2 for physical assistance   General bed mobility comments: able to move BLE EOB and sit up w/ minA for trunk control with HOB elevated. Sit<>supine +2 modA for trunk control and leg management.    Transfers Overall transfer level: Needs assistance Equipment used: Rolling walker (2 wheels) Transfers: Sit to/from Stand Sit to Stand: From elevated surface, Mod assist, +2 physical assistance           General transfer comment: cuing needed for hand and foot placement.    Ambulation/Gait Ambulation/Gait assistance: +2 physical assistance, Mod assist Gait Distance (Feet): 3 Feet Assistive device: Rolling walker (2 wheels) Gait Pattern/deviations: Step-to pattern, Decreased step length - right, Decreased step length - left Gait velocity: decreased     General Gait Details: able to sidestep w/ assist for steadying; improve ability to clear feet off of ground   Stairs             Wheelchair Mobility    Modified Rankin (Stroke Patients Only)       Balance Overall balance assessment: Needs assistance Sitting-balance support: Bilateral upper extremity supported, Feet supported Sitting balance-[name] Scale: Fair     Standing balance support: Bilateral upper extremity supported, During functional activity, Reliant  on assistive device for balance Standing balance-[name] Scale: Poor                               Cognition Arousal/Alertness: Awake/alert Behavior During Therapy: WFL for tasks assessed/performed Overall Cognitive Status: No family/caregiver present to determine baseline cognitive functioning                                          Exercises      General Comments        Pertinent Vitals/Pain Pain Assessment Pain Assessment: No/denies pain    Home Living                          Prior Function            PT Goals (current goals can now be found in the care plan section) Progress towards PT goals: Progressing toward goals    Frequency    Min 2X/week      PT Plan Current plan remains appropriate    Co-evaluation              AM-PAC PT "6 Clicks" Mobility   Outcome Measure  Help needed turning from your back to your side while in a flat bed without using bedrails?: A Lot Help needed moving from lying on your back to sitting on the side of a flat bed without using bedrails?: A Lot Help needed moving to and from a bed to a chair (including a wheelchair)?: A Lot Help needed standing up from a chair using your arms (e.g., wheelchair or bedside chair)?: A Lot Help needed to walk in hospital room?: Total Help needed climbing 3-5 steps with a railing? : Total 6 Click Score: 10    End of Session Equipment Utilized During Treatment: Gait belt Activity Tolerance: Patient tolerated treatment well Patient left: in bed;with call bell/phone within reach Nurse Communication: Mobility status PT Visit Diagnosis: Unsteadiness on feet (R26.81);Muscle weakness (generalized) (M62.81);Difficulty in walking, not elsewhere classified (R26.2)     Time: 9476-5465 PT Time Calculation (min) (ACUTE ONLY): 42 min  Charges:                        Turner Daniels, SPT  10/31/2021, 3:35 PM

## 2021-10-31 NOTE — Progress Notes (Signed)
Occupational Therapy Re-Evaluation Patient Details Name: Gary [name] MRN: [id number] DOB: August 05, 1955 Today's Date: 10/31/2021   History of present illness Patient was admitted secondary to SOB, COPD exacerbation, acute metabolic encephalopathy, and acute on chronic hypoxic and hypercapnic respiratory failure in setting of acute decompensated HFpEF and AECOPD requiring intubation and mechanical ventilation. The pt is now s/p tracheostomy and PEG tube placement 11/05/21.   OT comments  Mr Cislo was seen for OT re-evaluation on this date. Upon arrival to room pt reclined in bed, agreeable to tx. PMV valve placed for session and to eat at end of session - RN notified. Pt requires MOD A x2 exit bed. SUPERVISION seated grooming tasks. MAX A don B socks in sitting. Pt agreeable to trial standing at counter however prior to initiating stand pt states he feels unsafe seated at EOB and initiates return to bed by lying trunk down perpendicular to bed. Pt given instruction on safe transfer techniques and repeatedly encouraged to return to sitting to complete safe return to bed with assistance however pt refused. Pt ultimately required MOD A return BLE to bed and setup for meal. Goals updated this session and downgraded to reflect poor progress. Discharge recommendation remains appropriate.     Recommendations for follow up therapy are one component of a multi-disciplinary discharge planning process, led by the attending physician.  Recommendations may be updated based on patient status, additional functional criteria and insurance authorization.    Follow Up Recommendations  Skilled nursing-short term rehab (<3 hours/day)    Assistance Recommended at Discharge Frequent or constant Supervision/Assistance  Patient can return home with the following  Two people to help with walking and/or transfers;Assistance with cooking/housework;Direct supervision/assist for medications management;Assist for  transportation;Two people to help with bathing/dressing/bathroom   Equipment Recommendations  Other (comment) (defer)    Recommendations for Other Services      Precautions / Restrictions Precautions Precautions: Fall Restrictions Weight Bearing Restrictions: No       Mobility Bed Mobility Overal bed mobility: Needs Assistance Bed Mobility: Supine to Sit, Sit to Supine     Supine to sit: Mod assist, +2 for physical assistance, HOB elevated Sit to supine: Mod assist   General bed mobility comments: assist for BLE only    Transfers                   General transfer comment: pt refused attempts     Balance Overall balance assessment: Needs assistance Sitting-balance support: Single extremity supported, Feet supported Sitting balance-[name] Scale: Fair                                     ADL either performed or assessed with clinical judgement   ADL Overall ADL's : Needs assistance/impaired                                       General ADL Comments: SUPERVISION seated grooming tasks. MAX A don B socks in sitting.      Cognition Arousal/Alertness: Awake/alert Behavior During Therapy: WFL for tasks assessed/performed Overall Cognitive Status: No family/caregiver present to determine baseline cognitive functioning                                 General Comments: pt does  not respond well to logic or directions for safe transfers                   Pertinent Vitals/ Pain       Pain Assessment Pain Assessment: No/denies pain   Frequency  Min 2X/week        Progress Toward Goals  OT Goals(current goals can now be found in the care plan section)  Progress towards OT goals: Progressing toward goals  Acute Rehab OT Goals Patient Stated Goal: to be left alone OT Goal Formulation: With patient Time For Goal Achievement: 11/14/21 Potential to Achieve Goals: Fair ADL Goals Pt Will Perform Grooming:  with modified independence;sitting Pt Will Perform Lower Body Dressing: with min assist;with adaptive equipment;sitting/lateral leans Pt Will Transfer to Toilet: with mod assist;with +2 assist;stand pivot transfer;bedside commode  Plan Discharge plan remains appropriate;Frequency remains appropriate    Co-evaluation                 AM-PAC OT "6 Clicks" Daily Activity     Outcome Measure   Help from another person eating meals?: A Little Help from another person taking care of personal grooming?: A Little Help from another person toileting, which includes using toliet, bedpan, or urinal?: Total Help from another person bathing (including washing, rinsing, drying)?: A Lot Help from another person to put on and taking off regular upper body clothing?: A Little Help from another person to put on and taking off regular lower body clothing?: A Lot 6 Click Score: 14    End of Session    OT Visit Diagnosis: Other abnormalities of gait and mobility (R26.89);Muscle weakness (generalized) (M62.81)   Activity Tolerance Treatment limited secondary to agitation   Patient Left in bed;with call bell/phone within reach (pt refused bed alarm)   Nurse Communication Mobility status (pt refused bed alarm)        Time: 1660-6301 OT Time Calculation (min): 36 min  Charges: OT General Charges $OT Visit: 1 Visit OT Evaluation $OT Re-eval: 1 Re-eval OT Treatments $Self Care/Home Management : 23-37 mins  [name], M.S. OTR/L  10/31/21, 10:49 AM  ascom [phone]

## 2021-11-01 DIAGNOSIS — J9621 Acute and chronic respiratory failure with hypoxia: Secondary | ICD-10-CM | POA: Diagnosis not present

## 2021-11-01 DIAGNOSIS — J9622 Acute and chronic respiratory failure with hypercapnia: Secondary | ICD-10-CM | POA: Diagnosis not present

## 2021-11-01 LAB — GLUCOSE, CAPILLARY
Glucose-Capillary: 101 mg/dL — ABNORMAL HIGH (ref 70–99)
Glucose-Capillary: 115 mg/dL — ABNORMAL HIGH (ref 70–99)
Glucose-Capillary: 122 mg/dL — ABNORMAL HIGH (ref 70–99)
Glucose-Capillary: 131 mg/dL — ABNORMAL HIGH (ref 70–99)
Glucose-Capillary: 98 mg/dL (ref 70–99)
Glucose-Capillary: 99 mg/dL (ref 70–99)

## 2021-11-01 MED ORDER — COLCHICINE 0.6 MG PO TABS
1.2000 mg | ORAL_TABLET | Freq: Once | ORAL | Status: AC
Start: 2021-11-01 — End: 2021-11-01
  Administered 2021-11-01: 1.2 mg via ORAL
  Filled 2021-11-01: qty 2

## 2021-11-01 MED ORDER — OSMOLITE 1.5 CAL PO LIQD: 720.0000 mL | ORAL | Status: DC

## 2021-11-01 MED ORDER — COLCHICINE 0.6 MG PO TABS
0.6000 mg | ORAL_TABLET | Freq: Two times a day (BID) | ORAL | Status: DC
  Administered 2021-11-02: 0.6 mg via ORAL
  Filled 2021-11-01: qty 1

## 2021-11-01 MED ORDER — COLCHICINE 0.6 MG PO TABS
0.6000 mg | ORAL_TABLET | Freq: Once | ORAL | Status: AC
  Administered 2021-11-01: 0.6 mg via ORAL
  Filled 2021-11-01: qty 1

## 2021-11-01 MED ORDER — FREE WATER
30.0000 mL | Status: DC
  Administered 2021-11-07 – 2021-11-26 (×62): 30 mL

## 2021-11-01 NOTE — Progress Notes (Signed)
In PROGRESS NOTE    Gary [name]  OAC:[id number] DOB: April 19, 1955  DOA: 09/19/2021 Date of Service: 11/01/21 PCP: [facility], [facility] Course:  66 y/o male with multiple medical problems causing chronic hypercapneic respiratory failure who has had admissions in the past for exacerbations of the same and mechanical ventilation presented for evaluation of dyspnea, weakness. It is unclear if the patient has been compliant with his medications and NIMV at home.  He was admitted by the hospitalists for a presumed COPD exacerbation in the ER, treated with bronchodilators and NIMV but his mental status and hypercarbia worsened despite those interventions so he required intubation.   6/6: Presented to ED.  Required intubation and mechanical ventilation in the ED.  PCCM admit ICU 6/7: Hold diuresis due to worsening Creatinine.  ENT consulted for Trach placement per family request 6/8: Pt continues to spike temps tmax 101.8 degrees F.  Flexiseal placed overnight draining foul smelling watery stool~Cdiff and GI panel pending  6/9: Fever resolved.  Negative for C- diff, GI panel still pending.  Creatinine slightly worsened, requiring low dose levophed (2 mcg). Holding diuresis 6/10: propofol discontinued due hypertriglyceridemia; failed SBT 6/11: Tracheal aspirate from 6/6 resulting with Pseudomonas, ABX changed to Cefepime. neurologically intact, awaiting Trach placement 6/12: Pt calmer today following addition of oral benzo's, narcotics, and Seroquel via tube.  Creatinine worsened with decreased UOP, holding diuresis, low threshold for Nephrology consult  6/13: Pt with worsening renal failure creatinine 4.15.  UOP overnight 25 ml.  Nephrology consulted plans to start pt on hemodialysis follow dialysis catheter placement Pt underwent hemodialysis without ultrafiltration 06/14: .  Plans for HD session today.  Agitation has improved significantly with narcotics and antianxiety medications per  tube   06/16: Tracheostomy canceled 06/15 due to hyperkalemia and tentatively rescheduled for 06/20  6/18 failure to wean from vent 06/20: ENT placed Tracheostomy (#7.0 XLT cuffed).  Developed mild epistaxis post unsuccessful attempt at NGT placement.  Later developed hematochezia/melena, Protonix gtt started and GI consulted. 06/21: Diuresed yesterday with limited results. Patient had softed blood pressures throughout the day and diuresis was held as a result. HD was performed by the patient clotted off the machine and was not able to be rinsed back. Repeat hemoglobin is above 8. 06/23: Pt remains mechanically intubated settings: FiO2 50%/PEEP 10.  CXR concerning for moderate right pleural effusion with associated right basilar atelectasis vs. infiltrate.  Will perform [place] Chest to determine if pt needs right sided thoracentesis  06/24: Remains on the ventilator, FiO2 requirements decreasing, respiratory culture with abundant gram-negative rods: [name], on Meropenem 06/25: Oxygen requirements down to 40%, chest x-ray shows improvement on dense right consolidation, Pseudomonas susceptibilities pending 06/25: Pt remains on the ventilator settings: PEEP 10 and FiO2 40%; will attempt to wean PEEP to 5 to perform SBT  06/28: Pt tolerated PS on 06/27.  Overnight pt had episodes of vomiting TF's suspect secondary to malposition of nasogastric tube.  Will consult IR for PEG tube placement if unable to place PEG tube pt will need postpyloric Dobbhoff   06/29: Pt tolerated PS 5/5 for over an hour yesterday, however due to fatigue placed back on PRVC.  Pt pulled NGT out several times overnight, therefore will not attempt to reinsert.  Plans for PEG tube placement per IR on 06/30.   06/30: PEG tube placed per IR  07/1: Pt tolerated trach collar '@40'$ % throughout the day 07/2: Pt placed on ventilator overnight settings 16/5/35% tolerating well.  Will place pt on trach collar today and place orders for  PT/OT 7/3: Hemodynamically stable.  Tolerating TC (has remained on TC since 7/2 @ 08:00), plan for speech therapy and PT/OT 7/4: Has maintained on trach collar for greater than 48 hours, tolerating Passy-Muir valve.  Plan to transfer out of ICU. SLP recs: dysphagia 1 (puree) w/ nectar liquid.  7/5-7/6: sodium and creatinine trending appropriately, Hgb stable. Low K repleted and stable. Was off vent qhs (orders d/c or missed?) and was restarted. Will need LTAC, TOC following  7/7: pulled out trach overnight. Confusion re: vent plan among staff and myself. I asked PCCM to reevluate him and verify plan so the team and the patient can eb on the same page, and will help TOC arrange appropriate placement.  7/8: I spoke personally w/ Dr Milon Dikes [facility]) and he confirms patient does NOT need ventilator support, unless of course develops distress, but last night the patient tolerated well without it. Plan to meet w/ family today when they visit, I spoke w/ his sister yesterday on the phone. No concerns on CBC/BMP: Cr tending down a bit, Hgb stable, Na slightly high but improving over past few days. He is approaching medically stable and may be appropriate for LTAC vs SNF soon  7/9: Several long discussions with patient and family about medical condition, behavioral issues.  See separate progress notes.  Psychiatry evaluated, agree that he has capacity, started SSRI. 7/10: Patient remains medically stable, awaiting placement.  Discussed his behavioral issues again and encouraged him to always bring any concerns to nursing staff or to me calmly and I am happy to listen, requested he maintain professionalism towards staff, he seems amenable. 7/11-7/14: goal OOB to chair, needing hoyer lift, pt reeducated several times about staying in bed 07/11 and has been compliant through 07/14. Finally got bariatric chair and hoyer lift 7/12 and was OOB 07/13.  D/c monitoring and sitter and transferred to [place] status 07/13. Awaiting  placement.  7/15-17- working to reorient patient, but personality and coherence remain difficult. Stable for placement. Reminded patient not to verbally abuse staff  Consultants:  PCCU Nephrology ENT GI IR Psychiatry  Procedures: 10/03/21: tracheostomy placement 10/12/21: dialysis permacath placement  10/13/21: G-tube placed  Subjective: No events overnight.  Patient resting comfortably bed but asking when he is able to be discharged. He keeps saying he is going to leave AMA in a week or 2.   ASSESSMENT & PLAN:   Principal Problem:   Acute on chronic respiratory failure with hypoxia and hypercapnia ([name]) Active Problems:   Major depressive disorder, recurrent episode, moderate ([name])   Acute on chronic diastolic congestive heart failure ([name])   COPD with acute exacerbation ([name])   Acute metabolic encephalopathy   HTN (hypertension)   Obesity, Class III, BMI 40-49.9 (morbid obesity) ([place])   Obstructive sleep apnea   Chronic kidney disease, stage 3a ([name])   HLD (hyperlipidemia)   Pressure injury of skin  Acute on Chronic Hypoxic & Hypercapnic Respiratory Failure in the setting of acute decompensated HFpEF, AECOPD, Pseudomonas Pneumonia, & OSA/OHS Moderate right sided pleural effusion -ruled out, actually lung Consolidation --> STABLE S/p  tracheostomy by ENT by 6/20 Trach collar w/ ventilator support as needed / qhs but he does NOT need vent supoort overnight routinely per conversation w/ Dr Milon Dikes 07/08 Completed 10 day course of ceftazidime for treatment of Pseudomonas At this point, he is medically stable but placement is going to be an issue for him.  His  behavioral issues have been discussed with him and his behavior has improved    Acute decompensated HFpEF PMHx: Hypertension --> RESOLVED/STABLE  Echocardiogram 08/07/2021: LVEF 60 to 20%, grade 1 diastolic dysfunction, normal RV systolic function Was on intermittent hemodialysis for volume removal - has not needed HD  and has remained compensated. Cancelled permacath placement    AKI superimposed CKD stage IIIa  Hypokalemia  Hypernatremia --> RESOLVED During admissions in February 2023, creatinine 1.8 with GFR 40; in May 2023 creatinine 1.1 with GFR of 60 Monitor I&O's / urinary output Follow BMP  Cr down, good UOP noted, d/c dialysis catheter, monitor renal parameters daily. 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 Diff negative   Acute Blood Loss Anemia --> RESOLVED Nasopharyngeal trauma vs Upper GI bleed Trend CBC  Monitor for s/sx of bleeding and transfuse for Hgb <7 Continue BID IV PPI per GI, no need for scope    Hyperglycemia --> STABLE CBG's achs SSI  Acute Metabolic Encephalopathy in setting of CO2 Narcosis Sedation needs in setting of mechanical ventilation --> RESOLVED CT Head 09/19/21: negative;  UDS negative Treatment of hypercapnia as outlined above  Trach in place Gtube in place Was Requiring ventilatory support at night, has done well without this Working on increasing po intake, SLP and dietary following  Has not required ventilatory support, doing well on O2 via the trach Continues to eat well; G-tube will have to remain in place at least 3 months prior to removal   DVT prophylaxis:lovenox Code Status: FULL Disposition Plan / TOC needs: to med-surg 07/13, awaiting placement  Barriers to discharge / significant pending items: remains on trach; behavioral issues  Objective: Vitals:   11/01/21 0448 11/01/21 0736 11/01/21 0737 11/01/21 0944  BP: 110/73   137/87  Pulse: 84  89 84  Resp: '18  16 18  '$ Temp: 98.1 F (36.7 C)   98 F (36.7 C)  TempSrc:      SpO2: 90% (!) 80% 90% 93%  Weight:      Height:        Intake/Output Summary (Last 24 hours) at  11/01/2021 1431 Last data filed at 11/01/2021 1052 Gross per 24 hour  Intake 360 ml  Output 350 ml  Net 10 ml    Filed Weights   10/29/21 0500 10/30/21 0500 11/01/21 0228  Weight: (!) 186 kg (!) 189.6 kg (!) 190.1 kg   Physical Exam: Physical Exam Constitutional:      General: He is not in acute distress.    Appearance: Normal appearance. He is obese. He is not ill-appearing.  HENT:     Head: Normocephalic and atraumatic.     Mouth/Throat:     Mouth: Mucous membranes are moist.  Eyes:     Extraocular Movements: Extraocular movements intact.  Neck:     Comments: Trach in place Pulmonary:     Effort: No respiratory distress.     Breath sounds: Normal breath sounds.  Abdominal:     General: Bowel sounds are normal. There is no distension.     Palpations: Abdomen is soft.  Musculoskeletal:        General: Normal range of motion.     Cervical back: Normal range of motion.  Skin:    General: Skin is warm.  Neurological:     General: No focal deficit present.  Mental Status: He is alert.  Psychiatric:        Mood and Affect: Mood normal.     Scheduled Medications:   atorvastatin  10 mg Per Tube Daily   budesonide (PULMICORT) nebulizer solution  0.5 mg Nebulization BID   Chlorhexidine Gluconate Cloth  6 each Topical Daily   cholecalciferol  2,000 Units Per Tube Daily   enoxaparin (LOVENOX) injection  0.5 mg/kg Subcutaneous Q24H   escitalopram  5 mg Oral Daily   feeding supplement (PROSource TF)  90 mL Per Tube TID   free water  30 mL Per Tube Q4H   lidocaine  2 patch Transdermal Q24H   nutrition supplement (JUVEN)  1 packet Per Tube BID BM   mouth rinse  15 mL Mouth Rinse 4 times per day   pantoprazole sodium  40 mg Per Tube BID   potassium chloride  20 mEq Oral Daily   traZODone  50 mg Oral QHS    Continuous Infusions:  sodium chloride Stopped (10/07/21 0542)   feeding supplement (OSMOLITE 1.5 CAL)      PRN Medications:  acetaminophen, acetaminophen,  albuterol, benzonatate, bisacodyl, clonazepam, guaiFENesin, hydrOXYzine, loperamide, Muscle Rub, nitroGLYCERIN, mouth rinse, oxyCODONE, pentafluoroprop-tetrafluoroeth, polyethylene glycol, QUEtiapine  Antimicrobials:  Anti-infectives (From admission, onward)    Start     Dose/Rate Route Frequency Ordered Stop   10/12/21 1200  cefTAZidime (FORTAZ) 2 g in sodium chloride 0.9 % 100 mL IVPB        2 g 200 mL/hr over 30 Minutes Intravenous 2 times daily 10/12/21 1122 10/17/21 0844   10/12/21 0417  ceFAZolin (ANCEF) IVPB 1 g/50 mL premix  Status:  Discontinued        1 g 100 mL/hr over 30 Minutes Intravenous 30 min pre-op 10/12/21 0417 10/16/21 0751   10/09/21 2000  ceFEPIme (MAXIPIME) 1 g in sodium chloride 0.9 % 100 mL IVPB  Status:  Discontinued        1 g 200 mL/hr over 30 Minutes Intravenous Every 24 hours 10/09/21 1404 10/09/21 1515   10/09/21 2000  cefTAZidime (FORTAZ) 1 g in sodium chloride 0.9 % 100 mL IVPB  Status:  Discontinued        1 g 200 mL/hr over 30 Minutes Intravenous Every 24 hours 10/09/21 1515 10/12/21 1122   10/06/21 1830  meropenem (MERREM) 500 mg in sodium chloride 0.9 % 100 mL IVPB  Status:  Discontinued        500 mg 200 mL/hr over 30 Minutes Intravenous Every 24 hours 10/06/21 1735 10/09/21 1403   10/06/21 1800  doxycycline (VIBRAMYCIN) 100 mg in sodium chloride 0.9 % 250 mL IVPB  Status:  Discontinued        100 mg 125 mL/hr over 120 Minutes Intravenous Every 12 hours 10/06/21 1657 10/08/21 0838   09/29/21 1000  ceFEPIme (MAXIPIME) 2 g in sodium chloride 0.9 % 100 mL IVPB        2 g 200 mL/hr over 30 Minutes Intravenous Every 12 hours 09/29/21 0812 09/30/21 2342   09/26/21 2200  ceFEPIme (MAXIPIME) 1 g in sodium chloride 0.9 % 100 mL IVPB  Status:  Discontinued        1 g 200 mL/hr over 30 Minutes Intravenous Every 24 hours 09/26/21 1154 09/29/21 0812   09/26/21 2000  ceFEPIme (MAXIPIME) 2 g in sodium chloride 0.9 % 100 mL IVPB  Status:  Discontinued        2  g 200 mL/hr over 30 Minutes Intravenous Every 12  hours 09/26/21 0809 09/26/21 1154   09/24/21 1200  ceFEPIme (MAXIPIME) 2 g in sodium chloride 0.9 % 100 mL IVPB  Status:  Discontinued        2 g 200 mL/hr over 30 Minutes Intravenous Every 8 hours 09/24/21 0950 09/26/21 0809   09/21/21 1430  azithromycin (ZITHROMAX) 500 mg in sodium chloride 0.9 % 250 mL IVPB        500 mg 250 mL/hr over 60 Minutes Intravenous Every 24 hours 09/21/21 1344 09/23/21 1543   09/21/21 1430  cefTRIAXone (ROCEPHIN) 2 g in sodium chloride 0.9 % 100 mL IVPB  Status:  Discontinued        2 g 200 mL/hr over 30 Minutes Intravenous Every 24 hours 09/21/21 1344 09/24/21 0922       Data Reviewed: I have personally reviewed following labs and imaging studies  CBC: Recent Labs  Lab 10/26/21 0632 10/27/21 1202 10/28/21 1708 10/31/21 0519  WBC 8.8 6.7 8.1 7.5  NEUTROABS 6.0 4.5  --   --   HGB 9.2* 9.6* 9.6* 9.2*  HCT 34.2* 35.6* 35.1* 34.4*  MCV 84.2 83.4 83.0 85.4  PLT 137* 135* 143* 136*    Basic Metabolic Panel: Recent Labs  Lab 10/26/21 0632 10/27/21 1202 10/28/21 1708  NA 140 142 143  K 3.8 3.9 4.0  CL 103 106 108  CO2 32 32 31  GLUCOSE 103* 115* 80  BUN 26* 25* 34*  CREATININE 1.26* 1.11 1.17  CALCIUM 8.2* 8.5* 8.3*  MG 1.8 1.7  --   PHOS 4.7* 3.8  --     GFR: Estimated Creatinine Clearance: 114.3 mL/min (by C-G formula based on SCr of 1.17 mg/dL). Liver Function Tests: Recent Labs  Lab 10/26/21 7510 10/27/21 1202  ALBUMIN 2.6* 2.6*    No results for input(s): "LIPASE", "AMYLASE" in the last 168 hours. No results for input(s): "AMMONIA" in the last 168 hours. Coagulation Profile: No results for input(s): "INR", "PROTIME" in the last 168 hours. Cardiac Enzymes: No results for input(s): "CKTOTAL", "CKMB", "CKMBINDEX", "TROPONINI" in the last 168 hours. BNP (last 3 results) No results for input(s): "PROBNP" in the last 8760 hours. HbA1C: No results for input(s): "HGBA1C" in the  last 72 hours. CBG: Recent Labs  Lab 10/31/21 1923 11/01/21 0022 11/01/21 0454 11/01/21 0942 11/01/21 1228  GLUCAP 116* 122* 101* 98 131*    Lipid Profile: No results for input(s): "CHOL", "HDL", "LDLCALC", "TRIG", "CHOLHDL", "LDLDIRECT" in the last 72 hours. Thyroid Function Tests: No results for input(s): "TSH", "T4TOTAL", "FREET4", "T3FREE", "THYROIDAB" in the last 72 hours. Anemia Panel: No results for input(s): "VITAMINB12", "FOLATE", "FERRITIN", "TIBC", "IRON", "RETICCTPCT" in the last 72 hours. Urine analysis:    Component Value Date/Time   COLORURINE STRAW (A) 09/19/2021 1247   APPEARANCEUR CLEAR (A) 09/19/2021 1247   APPEARANCEUR Clear 04/15/2014 1240   LABSPEC 1.009 09/19/2021 1247   LABSPEC 1.013 04/15/2014 1240   PHURINE 5.0 09/19/2021 1247   GLUCOSEU 150 (A) 09/19/2021 1247   GLUCOSEU Negative 04/15/2014 1240   HGBUR NEGATIVE 09/19/2021 1247   BILIRUBINUR NEGATIVE 09/19/2021 1247   BILIRUBINUR Negative 04/15/2014 1240   KETONESUR NEGATIVE 09/19/2021 1247   PROTEINUR NEGATIVE 09/19/2021 1247   NITRITE NEGATIVE 09/19/2021 1247   LEUKOCYTESUR NEGATIVE 09/19/2021 1247   LEUKOCYTESUR Negative 04/15/2014 1240   Sepsis Labs: '@LABRCNTIP'$ (procalcitonin:4,lacticidven:4)  No results found for this or any previous visit (from the past 240 hour(s)).      Radiology Studies last 96 hours: No results found.  LOS: 55 days     Dwyane Dee, MD [facility] 11/01/2021, 2:31 PM    If 7PM-7AM, please contact night-coverage [email or web address]

## 2021-11-01 NOTE — Progress Notes (Signed)
Nutrition Follow-up  DOCUMENTATION CODES:   Morbid obesity  INTERVENTION:   -Double protein portions with meals  -Continue nocturnal feedings:  Osmolite 1.5 @ 60 ml/hr via g-tube over 12 hour period  90 ml Prosource TF TID.    30 ml free water flush every 4 hours  Tube feeding regimen provides 1320 kcal (50% of needs), 111 grams of protein, and 549 ml of H2O. Total free water: 729 ml daily  -Continue Cholecalciferol 2000 units daily via tube    -Continue Juven Fruit Punch BID via tube, each serving provides 95kcal and 2.5g of protein (amino acids glutamine and arginine)  NUTRITION DIAGNOSIS:   Inadequate oral intake related to inability to eat (pt sedated and ventilated) as evidenced by NPO status.  Progressing   GOAL:   Patient will meet greater than or equal to 90% of their needs  Progressing   MONITOR:   PO intake, Supplement acceptance, Labs, Weight trends, TF tolerance, Skin, I & O's  REASON FOR ASSESSMENT:   Ventilator    ASSESSMENT:   66 y/o male with h/o PUD, OSA, HTN, CKD III, substance abuse, CHF, Hep C and COPD and COVID 19 (02/2021) who is admitted with COPD exacerbation, CHF and AKI.  -Pt s/p tracheostomy 6/20 -Pt s/p 58F IR G-tube 6/30  Reviewed I/O's: -670 ml x 24 hours and +7 L since 10/18/21  UOP: 1.2 L x 24 hours   Reviewed MAR; pt has been refusing TF over the past 5 days. Pt also refusing free water flushes per RN notes.   Spoke with pt at bedside, who reports he is hungry and wants to order lunch. Pt refused to speak with RD until lunch was ordered. RD personally took lunch order and entered into meal ordering system. Pt then agreeable to speak with RD.  Pt reports improved appetite, consuming 100% of meals. Noted pt has completed 100% of meals within the past 24 hours (2030 kcals, 77 grams protein, meeting 76% of estimated kcal needs and 40% of estimated protein needs). Pt has refused multiple supplements in the past.   Pt shares that  he would like to discontinue TF, because he feels full and feels like they are exacerbating diarrhea. Praised pt for improvement in oral intake, however, reviewed with pt that due to high calorie needs to support illness and wound healing, he is not meeting nutritional goals via PO intake alone. Pt shares that he is most interested in losing 60 pounds rapidly and walking; RD discussed dangers of losing weight too quickly, which could result in losing muscle mass and hindering progress with PT. Discussed possibility of resuming TF at lower rate; pt reluctantly agreeable to plan.   Medications reviewed and include vitamin D3 and potassium chloride.   Labs reviewed: CBGS: 98-131 (inpatient orders for glycemic control are none).    Diet Order:   Diet Order             Diet 2 gram sodium Room service appropriate? Yes with Assist; Fluid consistency: Thin  Diet effective now                   EDUCATION NEEDS:   No education needs have been identified at this time  Skin:  Skin Assessment: Skin Integrity Issues: Skin Integrity Issues:: Incisions, Stage II, Other (Comment) Stage II: medial perineum Incisions: closed neck s/p trach Other: MASD to rt groin  Last BM:  10/31/21  Height:   Ht Readings from Last 1 Encounters:  09/28/21 6'  3" (1.905 m)    Weight:   Wt Readings from Last 1 Encounters:  11/01/21 (!) 190.1 kg    Ideal Body Weight:  89 kg  BMI:  Body mass index is 52.37 kg/m.  Estimated Nutritional Needs:   Kcal:  0177-9390  Protein:  175-190 grams  Fluid:  > 2 L    Loistine Chance, RD, LDN, [place] Registered Dietitian II Certified Diabetes Care and Education Specialist Please refer to [facility] for RD and/or RD on-call/weekend/after hours pager

## 2021-11-01 NOTE — Progress Notes (Signed)
Physical Therapy Treatment Patient Details Name: Gary [name] MRN: [id number] DOB: [birth year] Today's Date: 11/01/2021   History of Present Illness Patient was admitted secondary to SOB, COPD exacerbation, acute metabolic encephalopathy, and acute on chronic hypoxic and hypercapnic respiratory failure in setting of acute decompensated HFpEF and AECOPD requiring intubation and mechanical ventilation. The pt is now s/p tracheostomy and PEG tube placement 11/05/21.    PT Comments    Session limited due to pt reporting 10/10 pain despite being premedicated. Pt educated on physiological benefits of mobility and continued to decline but was agreeable to supine therex. Pt has shown improved strength with BLEs and demonstrated good effort with movements but needing some active assistance to complete majority of the movements. Pt will benefit from PT services in a SNF setting upon discharge to safely address deficits listed in patient problem list for decreased caregiver assistance and eventual return to PLOF.   Recommendations for follow up therapy are one component of a multi-disciplinary discharge planning process, led by the attending physician.  Recommendations may be updated based on patient status, additional functional criteria and insurance authorization.  Follow Up Recommendations  Skilled nursing-short term rehab (<3 hours/day) Can patient physically be transported by private vehicle: No   Assistance Recommended at Discharge Frequent or constant Supervision/Assistance  Patient can return home with the following Two people to help with walking and/or transfers;Two people to help with bathing/dressing/bathroom;Assistance with cooking/housework;Assist for transportation;Help with stairs or ramp for entrance   Equipment Recommendations  Other (comment) (TBD)    Recommendations for Other Services       Precautions / Restrictions Precautions Precautions: Fall Precaution Comments: IJ, G  tube, trach Restrictions Weight Bearing Restrictions: No     Mobility  Bed Mobility               General bed mobility comments: Not attempted; pt reports 10/10 pain    Transfers                        Ambulation/Gait                   Stairs             Wheelchair Mobility    Modified Rankin (Stroke Patients Only)       Balance       Sitting balance - Comments: Not attempted this session; pt with 10/10 pain                                    Cognition Arousal/Alertness: Awake/alert Behavior During Therapy: WFL for tasks assessed/performed Overall Cognitive Status: No family/caregiver present to determine baseline cognitive functioning                                          Exercises General Exercises - Lower Extremity Ankle Circles/Pumps: Strengthening, Both, 10 reps Quad Sets: Strengthening, Both, 10 reps Gluteal Sets: Strengthening, Both, 10 reps Heel Slides: Strengthening, Both, 10 reps Hip ABduction/ADduction: AAROM, Strengthening, Both, 10 reps Straight Leg Raises: AAROM, Strengthening, Both, 10 reps Other Exercises Other Exercises: modified supine leg press L/R x10; with manual resistance    General Comments        Pertinent Vitals/Pain Pain Assessment Pain Assessment: 0-10 Pain Score: 10-Worst pain ever Pain Location: Bilateral  feet Pain Descriptors / Indicators: Aching, Discomfort, Constant Pain Intervention(s): Monitored during session, Repositioned, Premedicated before session    Home Living                          Prior Function            PT Goals (current goals can now be found in the care plan section) Progress towards PT goals: Progressing toward goals    Frequency    Min 2X/week      PT Plan Current plan remains appropriate    Co-evaluation              AM-PAC PT "6 Clicks" Mobility   Outcome Measure  Help needed turning from your  back to your side while in a flat bed without using bedrails?: A Lot Help needed moving from lying on your back to sitting on the side of a flat bed without using bedrails?: A Lot Help needed moving to and from a bed to a chair (including a wheelchair)?: A Lot Help needed standing up from a chair using your arms (e.g., wheelchair or bedside chair)?: A Lot Help needed to walk in hospital room?: Total Help needed climbing 3-5 steps with a railing? : Total 6 Click Score: 10    End of Session   Activity Tolerance: Patient limited by pain Patient left: in bed;with call bell/phone within reach Nurse Communication: Mobility status PT Visit Diagnosis: Unsteadiness on feet (R26.81);Muscle weakness (generalized) (M62.81);Difficulty in walking, not elsewhere classified (R26.2)     Time: 6568-1275 PT Time Calculation (min) (ACUTE ONLY): 23 min  Charges:                        Turner Daniels, SPT  11/01/2021, 4:09 PM

## 2021-11-02 DIAGNOSIS — J9621 Acute and chronic respiratory failure with hypoxia: Secondary | ICD-10-CM | POA: Diagnosis not present

## 2021-11-02 DIAGNOSIS — L97529 Non-pressure chronic ulcer of other part of left foot with unspecified severity: Secondary | ICD-10-CM

## 2021-11-02 DIAGNOSIS — J9622 Acute and chronic respiratory failure with hypercapnia: Secondary | ICD-10-CM | POA: Diagnosis not present

## 2021-11-02 LAB — CBC WITH DIFFERENTIAL/PLATELET
Abs Immature Granulocytes: 0.02 10*3/uL (ref 0.00–0.07)
Basophils Absolute: 0 10*3/uL (ref 0.0–0.1)
Basophils Relative: 1 %
Eosinophils Absolute: 0.2 10*3/uL (ref 0.0–0.5)
Eosinophils Relative: 3 %
HCT: 35.1 % — ABNORMAL LOW (ref 39.0–52.0)
Hemoglobin: 9.3 g/dL — ABNORMAL LOW (ref 13.0–17.0)
Immature Granulocytes: 0 %
Lymphocytes Relative: 28 %
Lymphs Abs: 2 10*3/uL (ref 0.7–4.0)
MCH: 22.7 pg — ABNORMAL LOW (ref 26.0–34.0)
MCHC: 26.5 g/dL — ABNORMAL LOW (ref 30.0–36.0)
MCV: 85.6 fL (ref 80.0–100.0)
Monocytes Absolute: 0.8 10*3/uL (ref 0.1–1.0)
Monocytes Relative: 11 %
Neutro Abs: 4.2 10*3/uL (ref 1.7–7.7)
Neutrophils Relative %: 57 %
Platelets: 183 10*3/uL (ref 150–400)
RBC: 4.1 MIL/uL — ABNORMAL LOW (ref 4.22–5.81)
RDW: 18.2 % — ABNORMAL HIGH (ref 11.5–15.5)
WBC: 7.2 10*3/uL (ref 4.0–10.5)
nRBC: 0.4 % — ABNORMAL HIGH (ref 0.0–0.2)

## 2021-11-02 LAB — BASIC METABOLIC PANEL
Anion gap: 4 — ABNORMAL LOW (ref 5–15)
BUN: 20 mg/dL (ref 8–23)
CO2: 31 mmol/L (ref 22–32)
Calcium: 8.4 mg/dL — ABNORMAL LOW (ref 8.9–10.3)
Chloride: 106 mmol/L (ref 98–111)
Creatinine, Ser: 1.01 mg/dL (ref 0.61–1.24)
GFR, Estimated: >60 mL/min (ref >60)
Glucose, Bld: 84 mg/dL (ref 70–99)
Potassium: 4 mmol/L (ref 3.5–5.1)
Sodium: 141 mmol/L (ref 135–145)

## 2021-11-02 LAB — GLUCOSE, CAPILLARY
Glucose-Capillary: 107 mg/dL — ABNORMAL HIGH (ref 70–99)
Glucose-Capillary: 81 mg/dL (ref 70–99)

## 2021-11-02 LAB — URIC ACID: Uric Acid, Serum: 7 mg/dL (ref 3.7–8.6)

## 2021-11-02 LAB — MAGNESIUM: Magnesium: 1.5 mg/dL — ABNORMAL LOW (ref 1.7–2.4)

## 2021-11-02 MED ORDER — MAGNESIUM SULFATE 2 GM/50ML IV SOLN
2.0000 g | Freq: Once | INTRAVENOUS | Status: AC
  Administered 2021-11-02: 2 g via INTRAVENOUS
  Filled 2021-11-02: qty 50

## 2021-11-02 NOTE — Progress Notes (Addendum)
In PROGRESS NOTE    Gary [name]  LNL:[id number] DOB: 12/02/1955  DOA: 09/19/2021 Date of Service: 11/02/21 PCP: [facility] Course:  66 y/o male with multiple medical problems causing chronic hypercapneic respiratory failure who has had admissions in the past for exacerbations of the same and mechanical ventilation presented for evaluation of dyspnea, weakness. It is unclear if the patient has been compliant with his medications and NIMV at home.  He was admitted by the hospitalists for a presumed COPD exacerbation in the ER, treated with bronchodilators and NIMV but his mental status and hypercarbia worsened despite those interventions so he required intubation.   6/6: Presented to ED.  Required intubation and mechanical ventilation in the ED.  PCCM admit ICU 6/7: Hold diuresis due to worsening Creatinine.  ENT consulted for Trach placement per family request 6/8: Pt continues to spike temps tmax 101.8 degrees F.  Flexiseal placed overnight draining foul smelling watery stool~Cdiff and GI panel pending  6/9: Fever resolved.  Negative for C- diff, GI panel still pending.  Creatinine slightly worsened, requiring low dose levophed (2 mcg). Holding diuresis 6/10: propofol discontinued due hypertriglyceridemia; failed SBT 6/11: Tracheal aspirate from 6/6 resulting with Pseudomonas, ABX changed to Cefepime. neurologically intact, awaiting Trach placement 6/12: Pt calmer today following addition of oral benzo's, narcotics, and Seroquel via tube.  Creatinine worsened with decreased UOP, holding diuresis, low threshold for Nephrology consult  6/13: Pt with worsening renal failure creatinine 4.15.  UOP overnight 25 ml.  Nephrology consulted plans to start pt on hemodialysis follow dialysis catheter placement Pt underwent hemodialysis without ultrafiltration 06/14: .  Plans for HD session today.  Agitation has improved significantly with narcotics and antianxiety medications per  tube   06/16: Tracheostomy canceled 06/15 due to hyperkalemia and tentatively rescheduled for 06/20  6/18 failure to wean from vent 06/20: ENT placed Tracheostomy (#7.0 XLT cuffed).  Developed mild epistaxis post unsuccessful attempt at NGT placement.  Later developed hematochezia/melena, Protonix gtt started and GI consulted. 06/21: Diuresed yesterday with limited results. Patient had softed blood pressures throughout the day and diuresis was held as a result. HD was performed by the patient clotted off the machine and was not able to be rinsed back. Repeat hemoglobin is above 8. 06/23: Pt remains mechanically intubated settings: FiO2 50%/PEEP 10.  CXR concerning for moderate right pleural effusion with associated right basilar atelectasis vs. infiltrate.  Will perform [place] Chest to determine if pt needs right sided thoracentesis  06/24: Remains on the ventilator, FiO2 requirements decreasing, respiratory culture with abundant gram-negative rods: [name], on Meropenem 06/25: Oxygen requirements down to 40%, chest x-ray shows improvement on dense right consolidation, Pseudomonas susceptibilities pending 06/25: Pt remains on the ventilator settings: PEEP 10 and FiO2 40%; will attempt to wean PEEP to 5 to perform SBT  06/28: Pt tolerated PS on 06/27.  Overnight pt had episodes of vomiting TF's suspect secondary to malposition of nasogastric tube.  Will consult IR for PEG tube placement if unable to place PEG tube pt will need postpyloric Dobbhoff   06/29: Pt tolerated PS 5/5 for over an hour yesterday, however due to fatigue placed back on PRVC.  Pt pulled NGT out several times overnight, therefore will not attempt to reinsert.  Plans for PEG tube placement per IR on 06/30.   06/30: PEG tube placed per IR  07/1: Pt tolerated trach collar '@40'$ % throughout the day 07/2: Pt placed on ventilator overnight settings 16/5/35% tolerating well.  Will place pt on trach collar today and place orders for  PT/OT 7/3: Hemodynamically stable.  Tolerating TC (has remained on TC since 7/2 @ 08:00), plan for speech therapy and PT/OT 7/4: Has maintained on trach collar for greater than 48 hours, tolerating Passy-Muir valve.  Plan to transfer out of ICU. SLP recs: dysphagia 1 (puree) w/ nectar liquid.  7/5-7/6: sodium and creatinine trending appropriately, Hgb stable. Low K repleted and stable. Was off vent qhs (orders d/c or missed?) and was restarted. Will need LTAC, TOC following  7/7: pulled out trach overnight. Confusion re: vent plan among staff and myself. I asked PCCM to reevluate him and verify plan so the team and the patient can eb on the same page, and will help TOC arrange appropriate placement.  7/8: I spoke personally w/ Dr Milon Dikes [facility]) and he confirms patient does NOT need ventilator support, unless of course develops distress, but last night the patient tolerated well without it. Plan to meet w/ family today when they visit, I spoke w/ his sister yesterday on the phone. No concerns on CBC/BMP: Cr tending down a bit, Hgb stable, Na slightly high but improving over past few days. He is approaching medically stable and may be appropriate for LTAC vs SNF soon  7/9: Several long discussions with patient and family about medical condition, behavioral issues.  See separate progress notes.  Psychiatry evaluated, agree that he has capacity, started SSRI. 7/10: Patient remains medically stable, awaiting placement.  Discussed his behavioral issues again and encouraged him to always bring any concerns to nursing staff or to me calmly and I am happy to listen, requested he maintain professionalism towards staff, he seems amenable. 7/11-7/14: goal OOB to chair, needing hoyer lift, pt reeducated several times about staying in bed 07/11 and has been compliant through 07/14. Finally got bariatric chair and hoyer lift 7/12 and was OOB 07/13.  D/c monitoring and sitter and transferred to [place] status 07/13. Awaiting  placement.  7/15-17- working to reorient patient, but personality and coherence remain difficult. Stable for placement. Reminded patient not to verbally abuse staff  Consultants:  PCCU Nephrology ENT GI IR Psychiatry  Procedures: 10/03/21: tracheostomy placement 10/12/21: dialysis permacath placement  10/13/21: G-tube placed  Subjective: No events overnight.  Started complaining of pain in his feet bilaterally yesterday.  Today he has a black appearing ulcer on the bottom of his left foot. He otherwise is still fixated on wanting to leave the hospital by the end of the month.   ASSESSMENT & PLAN:   Principal Problem:   Acute on chronic respiratory failure with hypoxia and hypercapnia ([name]) Active Problems:   Major depressive disorder, recurrent episode, moderate ([name])   Acute on chronic diastolic congestive heart failure ([name])   COPD with acute exacerbation ([name])   Acute metabolic encephalopathy   HTN (hypertension)   Obesity, Class III, BMI 40-49.9 (morbid obesity) ([place])   Obstructive sleep apnea   Chronic kidney disease, stage 3a ([name])   HLD (hyperlipidemia)   Pressure injury of skin  Left foot ulcer - appearance of black eschar on plantar surface about 4-5 cm long and 2-3 cm wide - does not appear to be vasopressor necrosis from appearance; possibly pressure induced from foot on baseboard with prolonged hospitalization and body habitus - feels very indurated and nonviable; concern is might need debridement  -  podiatry consulted to help with evaluation and further plan   Acute on Chronic Hypoxic & Hypercapnic Respiratory Failure in the setting  of acute decompensated HFpEF, AECOPD, Pseudomonas Pneumonia, & OSA/OHS Moderate right sided pleural effusion -ruled out, actually lung Consolidation --> STABLE S/p  tracheostomy by ENT by 6/20 Trach collar w/ ventilator support as needed / qhs but he does NOT need vent supoort overnight routinely per conversation w/ Dr Milon Dikes  07/08 Completed 10 day course of ceftazidime for treatment of Pseudomonas At this point, he is medically stable but placement is going to be an issue for him.  His behavioral issues have been discussed with him and his behavior has improved    Acute decompensated HFpEF PMHx: Hypertension --> RESOLVED/STABLE  Echocardiogram 08/07/2021: LVEF 60 to 58%, grade 1 diastolic dysfunction, normal RV systolic function Was on intermittent hemodialysis for volume removal - has not needed HD and has remained compensated. Cancelled permacath placement    AKI superimposed CKD stage IIIa  Hypokalemia  Hypernatremia --> RESOLVED During admissions in February 2023, creatinine 1.8 with GFR 40; in May 2023 creatinine 1.1 with GFR of 60 Monitor I&O's / urinary output Follow BMP  Cr down, good UOP noted, d/c dialysis catheter, monitor renal parameters daily. 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 Diff negative   Acute Blood Loss Anemia --> RESOLVED Nasopharyngeal trauma vs Upper GI bleed Trend CBC  Monitor for s/sx of bleeding and transfuse for Hgb <7 Continue BID IV PPI per GI, no need for scope    Hyperglycemia --> STABLE CBG's achs SSI  Acute Metabolic Encephalopathy in setting of CO2 Narcosis Sedation needs in setting of mechanical ventilation --> RESOLVED CT Head 09/19/21: negative;  UDS negative Treatment of hypercapnia as outlined above  Trach in place Gtube in place Was Requiring ventilatory support at night, has done well without this Working on increasing po intake, SLP and dietary following  Has not required ventilatory support, doing well on O2 via the trach Continues to eat well; G-tube will have to remain in place at least 3 months prior to removal   DVT  prophylaxis:lovenox Code Status: FULL Disposition Plan / TOC needs: to med-surg 07/13, awaiting placement  Barriers to discharge / significant pending items: remains on trach; behavioral issues  Objective: Vitals:   11/02/21 0300 11/02/21 0530 11/02/21 0754 11/02/21 1300  BP:  116/80 119/74   Pulse:  91 82   Resp:  18 18   Temp:  98.8 F (37.1 C) 98.5 F (36.9 C)   TempSrc:      SpO2:  94% (!) 85% 96%  Weight: (!) 190.1 kg     Height:        Intake/Output Summary (Last 24 hours) at 11/02/2021 1548 Last data filed at 11/02/2021 1500 Gross per 24 hour  Intake 120 ml  Output 1850 ml  Net -1730 ml    Filed Weights   10/30/21 0500 11/01/21 0228 11/02/21 0300  Weight: (!) 189.6 kg (!) 190.1 kg (!) 190.1 kg   Physical Exam: Physical Exam Constitutional:      General: He is not in acute distress.    Appearance: Normal appearance. He is obese. He is not ill-appearing.  HENT:     Head: Normocephalic and atraumatic.     Mouth/Throat:     Mouth: Mucous membranes are moist.  Eyes:     Extraocular Movements: Extraocular movements intact.  Neck:     Comments: Trach in place Pulmonary:  Effort: No respiratory distress.     Breath sounds: Normal breath sounds.  Abdominal:     General: Bowel sounds are normal. There is no distension.     Palpations: Abdomen is soft.  Musculoskeletal:        General: Normal range of motion.     Cervical back: Normal range of motion.  Skin:    General: Skin is warm.  Neurological:     General: No focal deficit present.     Mental Status: He is alert.  Psychiatric:        Mood and Affect: Mood normal.     Scheduled Medications:   atorvastatin  10 mg Per Tube Daily   budesonide (PULMICORT) nebulizer solution  0.5 mg Nebulization BID   Chlorhexidine Gluconate Cloth  6 each Topical Daily   cholecalciferol  2,000 Units Per Tube Daily   enoxaparin (LOVENOX) injection  0.5 mg/kg Subcutaneous Q24H   escitalopram  5 mg Oral Daily   feeding  supplement (PROSource TF)  90 mL Per Tube TID   free water  30 mL Per Tube Q4H   lidocaine  2 patch Transdermal Q24H   nutrition supplement (JUVEN)  1 packet Per Tube BID BM   mouth rinse  15 mL Mouth Rinse 4 times per day   pantoprazole sodium  40 mg Per Tube BID   potassium chloride  20 mEq Oral Daily   traZODone  50 mg Oral QHS    Continuous Infusions:  sodium chloride Stopped (10/07/21 0542)   feeding supplement (OSMOLITE 1.5 CAL)      PRN Medications:  acetaminophen, acetaminophen, albuterol, benzonatate, bisacodyl, clonazepam, guaiFENesin, hydrOXYzine, loperamide, Muscle Rub, nitroGLYCERIN, mouth rinse, oxyCODONE, pentafluoroprop-tetrafluoroeth, polyethylene glycol, QUEtiapine  Antimicrobials:  Anti-infectives (From admission, onward)    Start     Dose/Rate Route Frequency Ordered Stop   10/12/21 1200  cefTAZidime (FORTAZ) 2 g in sodium chloride 0.9 % 100 mL IVPB        2 g 200 mL/hr over 30 Minutes Intravenous 2 times daily 10/12/21 1122 10/17/21 0844   10/12/21 0417  ceFAZolin (ANCEF) IVPB 1 g/50 mL premix  Status:  Discontinued        1 g 100 mL/hr over 30 Minutes Intravenous 30 min pre-op 10/12/21 0417 10/16/21 0751   10/09/21 2000  ceFEPIme (MAXIPIME) 1 g in sodium chloride 0.9 % 100 mL IVPB  Status:  Discontinued        1 g 200 mL/hr over 30 Minutes Intravenous Every 24 hours 10/09/21 1404 10/09/21 1515   10/09/21 2000  cefTAZidime (FORTAZ) 1 g in sodium chloride 0.9 % 100 mL IVPB  Status:  Discontinued        1 g 200 mL/hr over 30 Minutes Intravenous Every 24 hours 10/09/21 1515 10/12/21 1122   10/06/21 1830  meropenem (MERREM) 500 mg in sodium chloride 0.9 % 100 mL IVPB  Status:  Discontinued        500 mg 200 mL/hr over 30 Minutes Intravenous Every 24 hours 10/06/21 1735 10/09/21 1403   10/06/21 1800  doxycycline (VIBRAMYCIN) 100 mg in sodium chloride 0.9 % 250 mL IVPB  Status:  Discontinued        100 mg 125 mL/hr over 120 Minutes Intravenous Every 12 hours  10/06/21 1657 10/08/21 0838   09/29/21 1000  ceFEPIme (MAXIPIME) 2 g in sodium chloride 0.9 % 100 mL IVPB        2 g 200 mL/hr over 30 Minutes Intravenous Every 12 hours 09/29/21  6010 09/30/21 2342   09/26/21 2200  ceFEPIme (MAXIPIME) 1 g in sodium chloride 0.9 % 100 mL IVPB  Status:  Discontinued        1 g 200 mL/hr over 30 Minutes Intravenous Every 24 hours 09/26/21 1154 09/29/21 0812   09/26/21 2000  ceFEPIme (MAXIPIME) 2 g in sodium chloride 0.9 % 100 mL IVPB  Status:  Discontinued        2 g 200 mL/hr over 30 Minutes Intravenous Every 12 hours 09/26/21 0809 09/26/21 1154   09/24/21 1200  ceFEPIme (MAXIPIME) 2 g in sodium chloride 0.9 % 100 mL IVPB  Status:  Discontinued        2 g 200 mL/hr over 30 Minutes Intravenous Every 8 hours 09/24/21 0950 09/26/21 0809   09/21/21 1430  azithromycin (ZITHROMAX) 500 mg in sodium chloride 0.9 % 250 mL IVPB        500 mg 250 mL/hr over 60 Minutes Intravenous Every 24 hours 09/21/21 1344 09/23/21 1543   09/21/21 1430  cefTRIAXone (ROCEPHIN) 2 g in sodium chloride 0.9 % 100 mL IVPB  Status:  Discontinued        2 g 200 mL/hr over 30 Minutes Intravenous Every 24 hours 09/21/21 1344 09/24/21 0922       Data Reviewed: I have personally reviewed following labs and imaging studies  CBC: Recent Labs  Lab 10/27/21 1202 10/28/21 1708 10/31/21 0519 11/02/21 0530  WBC 6.7 8.1 7.5 7.2  NEUTROABS 4.5  --   --  4.2  HGB 9.6* 9.6* 9.2* 9.3*  HCT 35.6* 35.1* 34.4* 35.1*  MCV 83.4 83.0 85.4 85.6  PLT 135* 143* 136* 932    Basic Metabolic Panel: Recent Labs  Lab 10/27/21 1202 10/28/21 1708 11/02/21 0530  NA 142 143 141  K 3.9 4.0 4.0  CL 106 108 106  CO2 32 31 31  GLUCOSE 115* 80 84  BUN 25* 34* 20  CREATININE 1.11 1.17 1.01  CALCIUM 8.5* 8.3* 8.4*  MG 1.7  --  1.5*  PHOS 3.8  --   --     GFR: Estimated Creatinine Clearance: 132.4 mL/min (by C-G formula based on SCr of 1.01 mg/dL). Liver Function Tests: Recent Labs  Lab  10/27/21 1202  ALBUMIN 2.6*    No results for input(s): "LIPASE", "AMYLASE" in the last 168 hours. No results for input(s): "AMMONIA" in the last 168 hours. Coagulation Profile: No results for input(s): "INR", "PROTIME" in the last 168 hours. Cardiac Enzymes: No results for input(s): "CKTOTAL", "CKMB", "CKMBINDEX", "TROPONINI" in the last 168 hours. BNP (last 3 results) No results for input(s): "PROBNP" in the last 8760 hours. HbA1C: No results for input(s): "HGBA1C" in the last 72 hours. CBG: Recent Labs  Lab 11/01/21 1228 11/01/21 1629 11/01/21 2024 11/02/21 0147 11/02/21 0610  GLUCAP 131* 99 115* 107* 81    Lipid Profile: No results for input(s): "CHOL", "HDL", "LDLCALC", "TRIG", "CHOLHDL", "LDLDIRECT" in the last 72 hours. Thyroid Function Tests: No results for input(s): "TSH", "T4TOTAL", "FREET4", "T3FREE", "THYROIDAB" in the last 72 hours. Anemia Panel: No results for input(s): "VITAMINB12", "FOLATE", "FERRITIN", "TIBC", "IRON", "RETICCTPCT" in the last 72 hours. Urine analysis:    Component Value Date/Time   COLORURINE STRAW (A) 09/19/2021 1247   APPEARANCEUR CLEAR (A) 09/19/2021 1247   APPEARANCEUR Clear 04/15/2014 1240   LABSPEC 1.009 09/19/2021 1247   LABSPEC 1.013 04/15/2014 1240   PHURINE 5.0 09/19/2021 1247   GLUCOSEU 150 (A) 09/19/2021 1247   GLUCOSEU Negative 04/15/2014 1240  HGBUR NEGATIVE 09/19/2021 [place] 09/19/2021 1247   BILIRUBINUR Negative 04/15/2014 [place] 09/19/2021 1247   PROTEINUR NEGATIVE 09/19/2021 1247   NITRITE NEGATIVE 09/19/2021 1247   LEUKOCYTESUR NEGATIVE 09/19/2021 1247   LEUKOCYTESUR Negative 04/15/2014 1240   Sepsis Labs: '@LABRCNTIP'$ (procalcitonin:4,lacticidven:4)  No results found for this or any previous visit (from the past 240 hour(s)).      Radiology Studies last 96 hours: No results found.     LOS: 40 days     Dwyane Dee, MD [facility] 11/02/2021, 3:48 PM     If 7PM-7AM, please contact night-coverage [email or web address]

## 2021-11-02 NOTE — Progress Notes (Signed)
OT Cancellation Note  Patient Details Name: Gary [name] MRN: [id number] DOB: 01-22-1956   Cancelled Treatment:    Reason Eval/Treat Not Completed: Fatigue/lethargy limiting ability to participate. Chart reviewed. Upon arrival RN in room reports pt just completed PT session. Pt cites fatigue. Will re-attempt at later date/time as able.   [name], M.S. OTR/L  11/02/21, 2:23 PM  ascom [phone]

## 2021-11-02 NOTE — Progress Notes (Signed)
Physical Therapy Treatment Patient Details Name: Gary [name] MRN: [id number] DOB: 1955-05-10 Today's Date: 11/02/2021   History of Present Illness Patient was admitted secondary to SOB, COPD exacerbation, acute metabolic encephalopathy, and acute on chronic hypoxic and hypercapnic respiratory failure in setting of acute decompensated HFpEF and AECOPD requiring intubation and mechanical ventilation. The pt is now s/p tracheostomy and PEG tube placement 11/05/21.    PT Comments    Pt was pleasant and motivated to participate during the session and put forth good effort throughout. Pt demonstrates improved ability for bed mobility tasks and able to complete supine<>sit w/ minA for trunk control, still has difficulty managing legs when moving sit<>supine but able to manage trunk better compared to prior sessions. Continues to show improved initiation and strength with sit<>stands at a lower bed level (~26in) and was able to complete one stand with +1 modA but overall still needing +2 physical assist. Pt with improved standing endurance and able to maintain stands w/o any physical assist for at least 1 min but still very reliant on UE support to RW. Still has difficulty with advancing legs with substantial clearance and balance to attempt gait. Pt will benefit from PT services in a SNF setting upon discharge to safely address deficits listed in patient problem list for decreased caregiver assistance and eventual return to PLOF.      Recommendations for follow up therapy are one component of a multi-disciplinary discharge planning process, led by the attending physician.  Recommendations may be updated based on patient status, additional functional criteria and insurance authorization.  Follow Up Recommendations  Skilled nursing-short term rehab (<3 hours/day) Can patient physically be transported by private vehicle: No   Assistance Recommended at Discharge Frequent or constant Supervision/Assistance   Patient can return home with the following Two people to help with walking and/or transfers;Two people to help with bathing/dressing/bathroom;Assistance with cooking/housework;Assist for transportation;Help with stairs or ramp for entrance   Equipment Recommendations  Other (comment) (TBD)    Recommendations for Other Services       Precautions / Restrictions Precautions Precautions: Fall Precaution Comments: IJ, G tube, trach Restrictions Weight Bearing Restrictions: No     Mobility  Bed Mobility Overal bed mobility: Needs Assistance Bed Mobility: Supine to Sit, Sit to Supine     Supine to sit: Min assist, HOB elevated Sit to supine: Mod assist   General bed mobility comments: able to manage legs to EOB and needing minA for trunk control to sit up. modA needed for leg management sit<>supine but  able to manage trunk    Transfers Overall transfer level: Needs assistance   Transfers: Sit to/from Stand Sit to Stand: From elevated surface, Mod assist, +2 physical assistance           General transfer comment: +2 modA for initiating from ~26in bed height with one instance +1 modA. Cuing needed for hand and foot placement and to lean forward when initiating    Ambulation/Gait Ambulation/Gait assistance: Min assist, +2 physical assistance             General Gait Details: very minimal sidesteps to [facility] this session and still needing some assist for steadying. has difficulty sequencing steps and also needing some assist to manage RW   Stairs             Wheelchair Mobility    Modified Rankin (Stroke Patients Only)       Balance Overall balance assessment: Needs assistance Sitting-balance support: Bilateral upper extremity supported, Feet  supported Sitting balance-[name] Scale: Fair     Standing balance support: Bilateral upper extremity supported, During functional activity, Reliant on assistive device for balance Standing balance-[name] Scale:  Poor Standing balance comment: improved upright posture and able to sustain stand for at least 1 min                            Cognition Arousal/Alertness: Awake/alert Behavior During Therapy: WFL for tasks assessed/performed Overall Cognitive Status: No family/caregiver present to determine baseline cognitive functioning                                          Exercises      General Comments        Pertinent Vitals/Pain Pain Assessment Pain Assessment: No/denies pain Pain Score:  (reports 10/10 gout pain at start of session; 0/10 at end of session)    Home Living                          Prior Function            PT Goals (current goals can now be found in the care plan section) Progress towards PT goals: Progressing toward goals    Frequency    Min 2X/week      PT Plan Current plan remains appropriate    Co-evaluation              AM-PAC PT "6 Clicks" Mobility   Outcome Measure  Help needed turning from your back to your side while in a flat bed without using bedrails?: A Lot Help needed moving from lying on your back to sitting on the side of a flat bed without using bedrails?: A Lot Help needed moving to and from a bed to a chair (including a wheelchair)?: A Lot Help needed standing up from a chair using your arms (e.g., wheelchair or bedside chair)?: A Lot Help needed to walk in hospital room?: Total Help needed climbing 3-5 steps with a railing? : Total 6 Click Score: 10    End of Session Equipment Utilized During Treatment: Gait belt Activity Tolerance: Patient tolerated treatment well Patient left: in bed;with call bell/phone within reach Nurse Communication: Mobility status PT Visit Diagnosis: Unsteadiness on feet (R26.81);Muscle weakness (generalized) (M62.81);Difficulty in walking, not elsewhere classified (R26.2)     Time: 1320-1400 PT Time Calculation (min) (ACUTE ONLY): 40 min  Charges:                        Turner Daniels, SPT  11/02/2021, 2:32 PM

## 2021-11-02 NOTE — Progress Notes (Signed)
Pt declined all tube feedings and flushes, says he is eating enough orally.

## 2021-11-02 NOTE — Progress Notes (Addendum)
11/02/2021 at 2218:  Unit secretary alerted this RN that pt stated, "I'm going to pull out my trach and gauze if someone doesn't come right now." This RN reported to pt bedside. This RN called assigned respiratory therapist and reported that pt stated he would pull out his trach. RT reported to pt bedside. Pt was suctioned and educated by this RN and RT. Pt is resting and call bell within reach.

## 2021-11-02 NOTE — Progress Notes (Addendum)
11/02/2021 at 2202:  Pt call to front desk for this RN. RN alerted by Financial controller. RN reported to pt bedside. Pt stated that he needed oxygen. This RN has educated pt multiple times throughout shift that oxygen mask needs to placed over trach at all times. Respiratory therapy called and consulted. Oxygen aligned and in place. Pt resting and call bell within reach.

## 2021-11-02 NOTE — Progress Notes (Signed)
Called by RN for upset pt. Pt states that he did not tell the RN he was going to pull his trach out. Explained to pt that would be a bad idea, pt stated he wanted the gauze from around the trach. Explained to pt that the gauze was there as a barrier and to absorb any excess secretions. Placed ATC back over the trach. Pt SPO2 93%, RR 20, HR 82. Pt in no distress. Explained to pt that if he had an excessive amount of secretions, felt short of breath or was asleep that the PMV needed to be removed. Pt states that he is afraid to have it removed. Assured pt that he would be fine without PMV, made sure that he had his call bell in his hand. Pt asked me to suction him, suctioned pt for moderate amount of thick white secretions, pt tol well. Changed pt inner cannula and PMV placed back on pt. Pt asked me to come back around 1:00am and he would let me take his PMV off.

## 2021-11-02 NOTE — Progress Notes (Signed)
11/02/2021 at 2300:  Pt refused 0000 CBG check.

## 2021-11-02 NOTE — Progress Notes (Addendum)
11/02/2021 at 2054:  Pt refusing all tube feedings and flushes. Pt refused 2000 CBG check.

## 2021-11-03 DIAGNOSIS — J9621 Acute and chronic respiratory failure with hypoxia: Secondary | ICD-10-CM | POA: Diagnosis not present

## 2021-11-03 DIAGNOSIS — J9622 Acute and chronic respiratory failure with hypercapnia: Secondary | ICD-10-CM | POA: Diagnosis not present

## 2021-11-03 LAB — CBC WITH DIFFERENTIAL/PLATELET
Abs Immature Granulocytes: 0.02 10*3/uL (ref 0.00–0.07)
Basophils Absolute: 0 10*3/uL (ref 0.0–0.1)
Basophils Relative: 1 %
Eosinophils Absolute: 0.1 10*3/uL (ref 0.0–0.5)
Eosinophils Relative: 3 %
HCT: 37.6 % — ABNORMAL LOW (ref 39.0–52.0)
Hemoglobin: 9.9 g/dL — ABNORMAL LOW (ref 13.0–17.0)
Immature Granulocytes: 0 %
Lymphocytes Relative: 26 %
Lymphs Abs: 1.4 10*3/uL (ref 0.7–4.0)
MCH: 22.6 pg — ABNORMAL LOW (ref 26.0–34.0)
MCHC: 26.3 g/dL — ABNORMAL LOW (ref 30.0–36.0)
MCV: 85.6 fL (ref 80.0–100.0)
Monocytes Absolute: 0.6 10*3/uL (ref 0.1–1.0)
Monocytes Relative: 12 %
Neutro Abs: 3.1 10*3/uL (ref 1.7–7.7)
Neutrophils Relative %: 58 %
Platelets: 179 10*3/uL (ref 150–400)
RBC: 4.39 MIL/uL (ref 4.22–5.81)
RDW: 18.3 % — ABNORMAL HIGH (ref 11.5–15.5)
WBC: 5.2 10*3/uL (ref 4.0–10.5)
nRBC: 0.8 % — ABNORMAL HIGH (ref 0.0–0.2)

## 2021-11-03 LAB — BASIC METABOLIC PANEL
Anion gap: 2 — ABNORMAL LOW (ref 5–15)
BUN: 17 mg/dL (ref 8–23)
CO2: 32 mmol/L (ref 22–32)
Calcium: 8.6 mg/dL — ABNORMAL LOW (ref 8.9–10.3)
Chloride: 109 mmol/L (ref 98–111)
Creatinine, Ser: 1.06 mg/dL (ref 0.61–1.24)
GFR, Estimated: >60 mL/min (ref >60)
Glucose, Bld: 86 mg/dL (ref 70–99)
Potassium: 4.5 mmol/L (ref 3.5–5.1)
Sodium: 143 mmol/L (ref 135–145)

## 2021-11-03 LAB — MAGNESIUM: Magnesium: 2 mg/dL (ref 1.7–2.4)

## 2021-11-03 LAB — GLUCOSE, CAPILLARY: Glucose-Capillary: 112 mg/dL — ABNORMAL HIGH (ref 70–99)

## 2021-11-03 NOTE — Progress Notes (Signed)
In PROGRESS NOTE    KY RUMPLE  GYJ:[id number] DOB: 05/09/1955  DOA: 09/19/2021 Date of Service: 11/03/21 PCP: [facility] Course:  66 y/o male with multiple medical problems causing chronic hypercapneic respiratory failure who has had admissions in the past for exacerbations of the same and mechanical ventilation presented for evaluation of dyspnea, weakness. It is unclear if the patient has been compliant with his medications and NIMV at home.  He was admitted by the hospitalists for a presumed COPD exacerbation in the ER, treated with bronchodilators and NIMV but his mental status and hypercarbia worsened despite those interventions so he required intubation.   6/6: Presented to ED.  Required intubation and mechanical ventilation in the ED.  PCCM admit ICU 6/7: Hold diuresis due to worsening Creatinine.  ENT consulted for Trach placement per family request 6/8: Pt continues to spike temps tmax 101.8 degrees F.  Flexiseal placed overnight draining foul smelling watery stool~Cdiff and GI panel pending  6/9: Fever resolved.  Negative for C- diff, GI panel still pending.  Creatinine slightly worsened, requiring low dose levophed (2 mcg). Holding diuresis 6/10: propofol discontinued due hypertriglyceridemia; failed SBT 6/11: Tracheal aspirate from 6/6 resulting with Pseudomonas, ABX changed to Cefepime. neurologically intact, awaiting Trach placement 6/12: Pt calmer today following addition of oral benzo's, narcotics, and Seroquel via tube.  Creatinine worsened with decreased UOP, holding diuresis, low threshold for Nephrology consult  6/13: Pt with worsening renal failure creatinine 4.15.  UOP overnight 25 ml.  Nephrology consulted plans to start pt on hemodialysis follow dialysis catheter placement Pt underwent hemodialysis without ultrafiltration 06/14: .  Plans for HD session today.  Agitation has improved significantly with narcotics and antianxiety medications per  tube   06/16: Tracheostomy canceled 06/15 due to hyperkalemia and tentatively rescheduled for 06/20  6/18 failure to wean from vent 06/20: ENT placed Tracheostomy (#7.0 XLT cuffed).  Developed mild epistaxis post unsuccessful attempt at NGT placement.  Later developed hematochezia/melena, Protonix gtt started and GI consulted. 06/21: Diuresed yesterday with limited results. Patient had softed blood pressures throughout the day and diuresis was held as a result. HD was performed by the patient clotted off the machine and was not able to be rinsed back. Repeat hemoglobin is above 8. 06/23: Pt remains mechanically intubated settings: FiO2 50%/PEEP 10.  CXR concerning for moderate right pleural effusion with associated right basilar atelectasis vs. infiltrate.  Will perform [place] Chest to determine if pt needs right sided thoracentesis  06/24: Remains on the ventilator, FiO2 requirements decreasing, respiratory culture with abundant gram-negative rods: [name], on Meropenem 06/25: Oxygen requirements down to 40%, chest x-ray shows improvement on dense right consolidation, Pseudomonas susceptibilities pending 06/25: Pt remains on the ventilator settings: PEEP 10 and FiO2 40%; will attempt to wean PEEP to 5 to perform SBT  06/28: Pt tolerated PS on 06/27.  Overnight pt had episodes of vomiting TF's suspect secondary to malposition of nasogastric tube.  Will consult IR for PEG tube placement if unable to place PEG tube pt will need postpyloric Dobbhoff   06/29: Pt tolerated PS 5/5 for over an hour yesterday, however due to fatigue placed back on PRVC.  Pt pulled NGT out several times overnight, therefore will not attempt to reinsert.  Plans for PEG tube placement per IR on 06/30.   06/30: PEG tube placed per IR  07/1: Pt tolerated trach collar '@40'$ % throughout the day 07/2: Pt placed on ventilator overnight settings 16/5/35% tolerating well.  Will place pt on trach collar today and place orders for  PT/OT 7/3: Hemodynamically stable.  Tolerating TC (has remained on TC since 7/2 @ 08:00), plan for speech therapy and PT/OT 7/4: Has maintained on trach collar for greater than 48 hours, tolerating Passy-Muir valve.  Plan to transfer out of ICU. SLP recs: dysphagia 1 (puree) w/ nectar liquid.  7/5-7/6: sodium and creatinine trending appropriately, Hgb stable. Low K repleted and stable. Was off vent qhs (orders d/c or missed?) and was restarted. Will need LTAC, TOC following  7/7: pulled out trach overnight. Confusion re: vent plan among staff and myself. I asked PCCM to reevluate him and verify plan so the team and the patient can eb on the same page, and will help TOC arrange appropriate placement.  7/8: I spoke personally w/ Dr Milon Dikes [facility]) and he confirms patient does NOT need ventilator support, unless of course develops distress, but last night the patient tolerated well without it. Plan to meet w/ family today when they visit, I spoke w/ his sister yesterday on the phone. No concerns on CBC/BMP: Cr tending down a bit, Hgb stable, Na slightly high but improving over past few days. He is approaching medically stable and may be appropriate for LTAC vs SNF soon  7/9: Several long discussions with patient and family about medical condition, behavioral issues.  See separate progress notes.  Psychiatry evaluated, agree that he has capacity, started SSRI. 7/10: Patient remains medically stable, awaiting placement.  Discussed his behavioral issues again and encouraged him to always bring any concerns to nursing staff or to me calmly and I am happy to listen, requested he maintain professionalism towards staff, he seems amenable. 7/11-7/14: goal OOB to chair, needing hoyer lift, pt reeducated several times about staying in bed 07/11 and has been compliant through 07/14. Finally got bariatric chair and hoyer lift 7/12 and was OOB 07/13.  D/c monitoring and sitter and transferred to [place] status 07/13. Awaiting  placement.  7/15-17- working to reorient patient, but personality and coherence remain difficult. Stable for placement. Reminded patient not to verbally abuse staff  Consultants:  PCCU Nephrology ENT GI IR Psychiatry  Procedures: 10/03/21: tracheostomy placement 10/12/21: dialysis permacath placement  10/13/21: G-tube placed  Subjective: No events overnight.  Underwent debridement of left foot blood blister today with podiatry bedside.   ASSESSMENT & PLAN:   Principal Problem:   Acute on chronic respiratory failure with hypoxia and hypercapnia ([name]) Active Problems:   Major depressive disorder, recurrent episode, moderate ([name])   Acute on chronic diastolic congestive heart failure ([name])   COPD with acute exacerbation ([name])   Acute metabolic encephalopathy   HTN (hypertension)   Obesity, Class III, BMI 40-49.9 (morbid obesity) ([place])   Obstructive sleep apnea   Chronic kidney disease, stage 3a ([name])   HLD (hyperlipidemia)   Pressure injury of skin  Left foot blood blister - appreciate podiatry evaluation; underwent removal of blister bedside with podiatry on 7/21 - prevalon boots ordered to aid with pressure wounds going forward  Acute on Chronic Hypoxic & Hypercapnic Respiratory Failure in the setting of acute decompensated HFpEF, AECOPD, Pseudomonas Pneumonia, & OSA/OHS Moderate right sided pleural effusion -ruled out, actually lung Consolidation --> STABLE S/p  tracheostomy by ENT by 6/20 Trach collar w/ ventilator support as needed / qhs but he does NOT need vent supoort overnight routinely per conversation w/ Dr Milon Dikes 07/08 Completed 10 day course of ceftazidime for treatment of Pseudomonas At this point, he is medically  stable but placement is going to be an issue for him.  His behavioral issues have been discussed with him and his behavior has improved    Acute decompensated HFpEF PMHx: Hypertension --> RESOLVED/STABLE  Echocardiogram 08/07/2021: LVEF 60 to 65%,  grade 1 diastolic dysfunction, normal RV systolic function Was on intermittent hemodialysis for volume removal - has not needed HD and has remained compensated. Cancelled permacath placement    AKI superimposed CKD stage IIIa  Hypokalemia  Hypernatremia --> RESOLVED During admissions in February 2023, creatinine 1.8 with GFR 40; in May 2023 creatinine 1.1 with GFR of 60 Monitor I&O's / urinary output Follow BMP  Cr down, good UOP noted, d/c dialysis catheter, monitor renal parameters daily. 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 Diff negative   Acute Blood Loss Anemia --> RESOLVED Nasopharyngeal trauma vs Upper GI bleed Trend CBC  Monitor for s/sx of bleeding and transfuse for Hgb <7 Continue BID IV PPI per GI, no need for scope    Hyperglycemia --> STABLE CBG's achs SSI  Acute Metabolic Encephalopathy in setting of CO2 Narcosis Sedation needs in setting of mechanical ventilation --> RESOLVED CT Head 09/19/21: negative;  UDS negative Treatment of hypercapnia as outlined above  Trach in place Gtube in place Was Requiring ventilatory support at night, has done well without this Working on increasing po intake, SLP and dietary following  Has not required ventilatory support, doing well on O2 via the trach Continues to eat well; G-tube will have to remain in place at least 3 months prior to removal   DVT prophylaxis:lovenox Code Status: FULL Disposition Plan / TOC needs: to med-surg 07/13, awaiting placement  Barriers to discharge / significant pending items: remains on trach; behavioral issues  Objective: Vitals:   11/03/21 0539 11/03/21 0750 11/03/21 0754 11/03/21 1559  BP: 125/79  121/78 132/86  Pulse: 83  83 81  Resp: '20  18 18  '$ Temp: 98.1 F (36.7  C)  98.3 F (36.8 C) 98.7 F (37.1 C)  TempSrc:      SpO2: 95% 93% 93% 93%  Weight:      Height:        Intake/Output Summary (Last 24 hours) at 11/03/2021 1604 Last data filed at 11/03/2021 1300 Gross per 24 hour  Intake 840 ml  Output 925 ml  Net -85 ml    Filed Weights   11/01/21 0228 11/02/21 0300 11/03/21 0500  Weight: (!) 190.1 kg (!) 190.1 kg (!) 191.4 kg   Physical Exam: Physical Exam Constitutional:      General: He is not in acute distress.    Appearance: Normal appearance. He is obese. He is not ill-appearing.  HENT:     Head: Normocephalic and atraumatic.     Mouth/Throat:     Mouth: Mucous membranes are moist.  Eyes:     Extraocular Movements: Extraocular movements intact.  Neck:     Comments: Trach in place Pulmonary:     Effort: No respiratory distress.     Breath sounds: Normal breath sounds.  Abdominal:     General: Bowel sounds are normal. There is no distension.     Palpations: Abdomen is soft.  Musculoskeletal:        General: Normal range of motion.     Cervical back: Normal range of motion.  Skin:  General: Skin is warm.  Neurological:     General: No focal deficit present.     Mental Status: He is alert.  Psychiatric:        Mood and Affect: Mood normal.     Scheduled Medications:   atorvastatin  10 mg Per Tube Daily   budesonide (PULMICORT) nebulizer solution  0.5 mg Nebulization BID   Chlorhexidine Gluconate Cloth  6 each Topical Daily   cholecalciferol  2,000 Units Per Tube Daily   enoxaparin (LOVENOX) injection  0.5 mg/kg Subcutaneous Q24H   escitalopram  5 mg Oral Daily   feeding supplement (PROSource TF)  90 mL Per Tube TID   free water  30 mL Per Tube Q4H   lidocaine  2 patch Transdermal Q24H   nutrition supplement (JUVEN)  1 packet Per Tube BID BM   mouth rinse  15 mL Mouth Rinse 4 times per day   pantoprazole sodium  40 mg Per Tube BID   potassium chloride  20 mEq Oral Daily   traZODone  50 mg Oral QHS     Continuous Infusions:  sodium chloride Stopped (10/07/21 0542)    PRN Medications:  acetaminophen, acetaminophen, albuterol, benzonatate, bisacodyl, clonazepam, guaiFENesin, hydrOXYzine, loperamide, Muscle Rub, nitroGLYCERIN, mouth rinse, oxyCODONE, pentafluoroprop-tetrafluoroeth, polyethylene glycol, QUEtiapine  Antimicrobials:  Anti-infectives (From admission, onward)    Start     Dose/Rate Route Frequency Ordered Stop   10/12/21 1200  cefTAZidime (FORTAZ) 2 g in sodium chloride 0.9 % 100 mL IVPB        2 g 200 mL/hr over 30 Minutes Intravenous 2 times daily 10/12/21 1122 10/17/21 0844   10/12/21 0417  ceFAZolin (ANCEF) IVPB 1 g/50 mL premix  Status:  Discontinued        1 g 100 mL/hr over 30 Minutes Intravenous 30 min pre-op 10/12/21 0417 10/16/21 0751   10/09/21 2000  ceFEPIme (MAXIPIME) 1 g in sodium chloride 0.9 % 100 mL IVPB  Status:  Discontinued        1 g 200 mL/hr over 30 Minutes Intravenous Every 24 hours 10/09/21 1404 10/09/21 1515   10/09/21 2000  cefTAZidime (FORTAZ) 1 g in sodium chloride 0.9 % 100 mL IVPB  Status:  Discontinued        1 g 200 mL/hr over 30 Minutes Intravenous Every 24 hours 10/09/21 1515 10/12/21 1122   10/06/21 1830  meropenem (MERREM) 500 mg in sodium chloride 0.9 % 100 mL IVPB  Status:  Discontinued        500 mg 200 mL/hr over 30 Minutes Intravenous Every 24 hours 10/06/21 1735 10/09/21 1403   10/06/21 1800  doxycycline (VIBRAMYCIN) 100 mg in sodium chloride 0.9 % 250 mL IVPB  Status:  Discontinued        100 mg 125 mL/hr over 120 Minutes Intravenous Every 12 hours 10/06/21 1657 10/08/21 0838   09/29/21 1000  ceFEPIme (MAXIPIME) 2 g in sodium chloride 0.9 % 100 mL IVPB        2 g 200 mL/hr over 30 Minutes Intravenous Every 12 hours 09/29/21 0812 09/30/21 2342   09/26/21 2200  ceFEPIme (MAXIPIME) 1 g in sodium chloride 0.9 % 100 mL IVPB  Status:  Discontinued        1 g 200 mL/hr over 30 Minutes Intravenous Every 24 hours 09/26/21 1154  09/29/21 0812   09/26/21 2000  ceFEPIme (MAXIPIME) 2 g in sodium chloride 0.9 % 100 mL IVPB  Status:  Discontinued  2 g 200 mL/hr over 30 Minutes Intravenous Every 12 hours 09/26/21 0809 09/26/21 1154   09/24/21 1200  ceFEPIme (MAXIPIME) 2 g in sodium chloride 0.9 % 100 mL IVPB  Status:  Discontinued        2 g 200 mL/hr over 30 Minutes Intravenous Every 8 hours 09/24/21 0950 09/26/21 0809   09/21/21 1430  azithromycin (ZITHROMAX) 500 mg in sodium chloride 0.9 % 250 mL IVPB        500 mg 250 mL/hr over 60 Minutes Intravenous Every 24 hours 09/21/21 1344 09/23/21 1543   09/21/21 1430  cefTRIAXone (ROCEPHIN) 2 g in sodium chloride 0.9 % 100 mL IVPB  Status:  Discontinued        2 g 200 mL/hr over 30 Minutes Intravenous Every 24 hours 09/21/21 1344 09/24/21 0922       Data Reviewed: I have personally reviewed following labs and imaging studies  CBC: Recent Labs  Lab 10/28/21 1708 10/31/21 0519 11/02/21 0530 11/03/21 0503  WBC 8.1 7.5 7.2 5.2  NEUTROABS  --   --  4.2 3.1  HGB 9.6* 9.2* 9.3* 9.9*  HCT 35.1* 34.4* 35.1* 37.6*  MCV 83.0 85.4 85.6 85.6  PLT 143* 136* 183 572    Basic Metabolic Panel: Recent Labs  Lab 10/28/21 1708 11/02/21 0530 11/03/21 0503  NA 143 141 143  K 4.0 4.0 4.5  CL 108 106 109  CO2 31 31 32  GLUCOSE 80 84 86  BUN 34* 20 17  CREATININE 1.17 1.01 1.06  CALCIUM 8.3* 8.4* 8.6*  MG  --  1.5* 2.0    GFR: Estimated Creatinine Clearance: 126.8 mL/min (by C-G formula based on SCr of 1.06 mg/dL). Liver Function Tests: No results for input(s): "AST", "ALT", "ALKPHOS", "BILITOT", "PROT", "ALBUMIN" in the last 168 hours.  No results for input(s): "LIPASE", "AMYLASE" in the last 168 hours. No results for input(s): "AMMONIA" in the last 168 hours. Coagulation Profile: No results for input(s): "INR", "PROTIME" in the last 168 hours. Cardiac Enzymes: No results for input(s): "CKTOTAL", "CKMB", "CKMBINDEX", "TROPONINI" in the last 168 hours. BNP  (last 3 results) No results for input(s): "PROBNP" in the last 8760 hours. HbA1C: No results for input(s): "HGBA1C" in the last 72 hours. CBG: Recent Labs  Lab 11/01/21 1228 11/01/21 1629 11/01/21 2024 11/02/21 0147 11/02/21 0610  GLUCAP 131* 99 115* 107* 81    Lipid Profile: No results for input(s): "CHOL", "HDL", "LDLCALC", "TRIG", "CHOLHDL", "LDLDIRECT" in the last 72 hours. Thyroid Function Tests: No results for input(s): "TSH", "T4TOTAL", "FREET4", "T3FREE", "THYROIDAB" in the last 72 hours. Anemia Panel: No results for input(s): "VITAMINB12", "FOLATE", "FERRITIN", "TIBC", "IRON", "RETICCTPCT" in the last 72 hours. Urine analysis:    Component Value Date/Time   COLORURINE STRAW (A) 09/19/2021 1247   APPEARANCEUR CLEAR (A) 09/19/2021 1247   APPEARANCEUR Clear 04/15/2014 1240   LABSPEC 1.009 09/19/2021 1247   LABSPEC 1.013 04/15/2014 1240   PHURINE 5.0 09/19/2021 1247   GLUCOSEU 150 (A) 09/19/2021 1247   GLUCOSEU Negative 04/15/2014 1240   HGBUR NEGATIVE 09/19/2021 1247   BILIRUBINUR NEGATIVE 09/19/2021 1247   BILIRUBINUR Negative 04/15/2014 1240   KETONESUR NEGATIVE 09/19/2021 1247   PROTEINUR NEGATIVE 09/19/2021 1247   NITRITE NEGATIVE 09/19/2021 1247   LEUKOCYTESUR NEGATIVE 09/19/2021 1247   LEUKOCYTESUR Negative 04/15/2014 1240   Sepsis Labs: '@LABRCNTIP'$ (procalcitonin:4,lacticidven:4)  No results found for this or any previous visit (from the past 240 hour(s)).      Radiology Studies last 96 hours: No  results found.     LOS: 92 days     Dwyane Dee, MD [facility] 11/03/2021, 4:04 PM    If 7PM-7AM, please contact night-coverage [email or web address]

## 2021-11-03 NOTE — Progress Notes (Signed)
Nutrition Follow-up  DOCUMENTATION CODES:   Morbid obesity  INTERVENTION:   -Double protein portions with meals -Continue Cholecalciferol 2000 units daily via tube  -Continue Juven Fruit Punch BID via tube, each serving provides 95kcal and 2.5g of protein (amino acids glutamine and arginine) -D/c nocturnal TF -Continue 90 ml Prosource TF TID via tube  NUTRITION DIAGNOSIS:   Inadequate oral intake related to inability to eat (pt sedated and ventilated) as evidenced by NPO status.  Progressing  GOAL:   Patient will meet greater than or equal to 90% of their needs  Progressing   MONITOR:   PO intake, Supplement acceptance, Labs, Weight trends, TF tolerance, Skin, I & O's  REASON FOR ASSESSMENT:   Ventilator    ASSESSMENT:   66 y/o male with h/o PUD, OSA, HTN, CKD III, substance abuse, CHF, Hep C and COPD and COVID 19 (02/2021) who is admitted with COPD exacerbation, CHF and AKI.  Reviewed I/O's: -1.4 L x 24 hours and +2.5 L since 10/20/21  UOP: 1.6 L x 24 hours   -Pt s/p tracheostomy 6/20 -Pt s/p 20F IR G-tube 6/30  Reviewed MAR. Pt continues to refuse nocturnal tube feedings.   Spoke with pt at bedside, who complains of shortness of breath. Pt shares that his appetite has improved and consumed 100% of his breakfast (eggs, sausage, toast, and cereal). He has already ordered his lunch.   Pt admits that he continues to refuse his TF. He states "I'm eating all of my food. I just want it off". Reviewed meal completions. Pt consuming 50-100% of meals. Noted pt was consuming 76% of estimated kcal needs at last visit. Pt consumed 59% of kcals and 26% of protein needs pver the past 24 hours. RD discussed concern regarding inadequate protein intake with pt and how that can impact his wound healing and preservation of lean body mass. Pt is amenable to continue protein modular. Case discussed with MD to inform change of care plan.   Medications reviewed and include vitamin D3.    Labs reviewed: CBGS: 81-115.    Diet Order:   Diet Order             Diet 2 gram sodium Room service appropriate? Yes with Assist; Fluid consistency: Thin  Diet effective now                   EDUCATION NEEDS:   No education needs have been identified at this time  Skin:  Skin Assessment: Skin Integrity Issues: Skin Integrity Issues:: Incisions, Stage II, Other (Comment) Stage II: medial perineum Incisions: closed neck s/p trach Other: MASD to rt groin  Last BM:  11/02/21 (type 6)  Height:   Ht Readings from Last 1 Encounters:  11/03/21 '6\' 3"'$  (1.905 m)    Weight:   Wt Readings from Last 1 Encounters:  11/03/21 (!) 191.4 kg    Ideal Body Weight:  89 kg  BMI:  Body mass index is 52.75 kg/m.  Estimated Nutritional Needs:   Kcal:  9604-5409  Protein:  175-190 grams  Fluid:  > 2 L    Loistine Chance, RD, LDN, [place] Registered Dietitian II Certified Diabetes Care and Education Specialist Please refer to [facility] for RD and/or RD on-call/weekend/after hours pager
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
## 2021-11-03 NOTE — Progress Notes (Signed)
Pt PMV removed.

## 2021-11-03 NOTE — Progress Notes (Signed)
Patient continuously yells "nurse" out in order to get our attention. This RN explained multiple times throughout shift that it is inappropriate to do this.  Used maxiSky lift to assist patient to chair, tolerated well.

## 2021-11-03 NOTE — Progress Notes (Signed)
Physical Therapy Treatment Patient Details Name: Gary [name] MRN: [id number] DOB: [birth year] Today's Date: 11/03/2021   History of Present Illness Patient was admitted secondary to SOB, COPD exacerbation, acute metabolic encephalopathy, and acute on chronic hypoxic and hypercapnic respiratory failure in setting of acute decompensated HFpEF and AECOPD requiring intubation and mechanical ventilation. The pt is now s/p tracheostomy and PEG tube placement 11/05/21.    PT Comments    Pt was pleasant and motivated to participate during the session and put forth good effort throughout. Pt continues to improve ability when initiating bed mobility. He is able to manage legs to EOB consistently and shows good effort with sitting up but still needing minA to complete w/ HOB. Still needs assist with legs moving sit<>supine but is able to manage trunk better. Pt was able to complete some seated therex EOB with improved sitting balance and is able to adjust/weightshift w/o physical assistance. Pt will benefit from PT services in a SNF setting upon discharge to safely address deficits listed in patient problem list for decreased caregiver assistance and eventual return to PLOF.    Recommendations for follow up therapy are one component of a multi-disciplinary discharge planning process, led by the attending physician.  Recommendations may be updated based on patient status, additional functional criteria and insurance authorization.  Follow Up Recommendations  Skilled nursing-short term rehab (<3 hours/day) Can patient physically be transported by private vehicle: No   Assistance Recommended at Discharge Frequent or constant Supervision/Assistance  Patient can return home with the following Two people to help with walking and/or transfers;Two people to help with bathing/dressing/bathroom;Assistance with cooking/housework;Assist for transportation;Help with stairs or ramp for entrance   Equipment  Recommendations  Other (comment) (TBD)    Recommendations for Other Services       Precautions / Restrictions Precautions Precautions: Fall Precaution Comments: IJ, G tube, trach Restrictions Weight Bearing Restrictions: No Other Position/Activity Restrictions: Paring of blood blister on LLE 11/03/21; Per Dr Luana Shu no weightbearing restrictions on LLE     Mobility  Bed Mobility Overal bed mobility: Needs Assistance Bed Mobility: Supine to Sit, Sit to Supine     Supine to sit: Min assist, HOB elevated Sit to supine: Mod assist   General bed mobility comments: able to manage legs to EOB and needing minA for trunk control to sit up; cuing needed for hand placement. modA needed for leg management sit<>supine but  able to manage trunk    Transfers                   General transfer comment: not attempted unable to have +2 assistance this session    Ambulation/Gait                   Stairs             Wheelchair Mobility    Modified Rankin (Stroke Patients Only)       Balance Overall balance assessment: Needs assistance Sitting-balance support: Bilateral upper extremity supported, Feet supported Sitting balance-[name] Scale: Fair                                      Cognition Arousal/Alertness: Awake/alert Behavior During Therapy: WFL for tasks assessed/performed Overall Cognitive Status: No family/caregiver present to determine baseline cognitive functioning  Exercises General Exercises - Lower Extremity Ankle Circles/Pumps: Strengthening, Both, 10 reps Quad Sets: Strengthening, Both, 10 reps Gluteal Sets: Strengthening, Both, 10 reps Long Arc Quad: Strengthening, Both, 10 reps Heel Slides: Strengthening, Both, 10 reps Hip ABduction/ADduction: Supine, Strengthening, Both, 10 reps Hip Flexion/Marching: Seated, Strengthening, Both, 10 reps Other Exercises Other  Exercises: modified supine leg press L/R x10; with manual resistance    General Comments        Pertinent Vitals/Pain Pain Assessment Pain Assessment: 0-10 Pain Score: 7  Pain Location: L foot Pain Descriptors / Indicators: Aching, Discomfort, Constant Pain Intervention(s): Monitored during session, Repositioned    Home Living                          Prior Function            PT Goals (current goals can now be found in the care plan section) Progress towards PT goals: Progressing toward goals    Frequency    Min 2X/week      PT Plan Current plan remains appropriate    Co-evaluation              AM-PAC PT "6 Clicks" Mobility   Outcome Measure  Help needed turning from your back to your side while in a flat bed without using bedrails?: A Lot Help needed moving from lying on your back to sitting on the side of a flat bed without using bedrails?: A Lot Help needed moving to and from a bed to a chair (including a wheelchair)?: A Lot Help needed standing up from a chair using your arms (e.g., wheelchair or bedside chair)?: A Lot Help needed to walk in hospital room?: Total Help needed climbing 3-5 steps with a railing? : Total 6 Click Score: 10    End of Session Equipment Utilized During Treatment: Gait belt Activity Tolerance: Patient tolerated treatment well Patient left: in bed;with call bell/phone within reach Nurse Communication: Mobility status PT Visit Diagnosis: Unsteadiness on feet (R26.81);Muscle weakness (generalized) (M62.81);Difficulty in walking, not elsewhere classified (R26.2)     Time: 0093-8182 PT Time Calculation (min) (ACUTE ONLY): 30 min  Charges:                        Turner Daniels, SPT  11/03/2021, 3:00 PM

## 2021-11-03 NOTE — Plan of Care (Signed)
  Problem: Education: Goal: Ability to demonstrate management of disease process will improve Outcome: Progressing Goal: Ability to verbalize understanding of medication therapies will improve Outcome: Progressing   Problem: Activity: Goal: Capacity to carry out activities will improve Outcome: Progressing   Problem: Cardiac: Goal: Ability to achieve and maintain adequate cardiopulmonary perfusion will improve Outcome: Progressing   Problem: Education: Goal: Knowledge of disease or condition will improve Outcome: Progressing Goal: Knowledge of the prescribed therapeutic regimen will improve Outcome: Progressing   Problem: Activity: Goal: Ability to tolerate increased activity will improve Outcome: Progressing Goal: Will verbalize the importance of balancing activity with adequate rest periods Outcome: Progressing   Problem: Respiratory: Goal: Ability to maintain a clear airway will improve Outcome: Progressing Goal: Levels of oxygenation will improve Outcome: Progressing Goal: Ability to maintain adequate ventilation will improve Outcome: Progressing   Problem: Education: Goal: Knowledge of General Education information will improve Description: Including pain rating scale, medication(s)/side effects and non-pharmacologic comfort measures Outcome: Progressing   Problem: Health Behavior/Discharge Planning: Goal: Ability to manage health-related needs will improve Outcome: Progressing   Problem: Clinical Measurements: Goal: Ability to maintain clinical measurements within normal limits will improve Outcome: Progressing Goal: Will remain free from infection Outcome: Progressing Goal: Diagnostic test results will improve Outcome: Progressing Goal: Respiratory complications will improve Outcome: Progressing Goal: Cardiovascular complication will be avoided Outcome: Progressing   Problem: Activity: Goal: Risk for activity intolerance will decrease Outcome:  Progressing   Problem: Nutrition: Goal: Adequate nutrition will be maintained Outcome: Progressing   Problem: Coping: Goal: Level of anxiety will decrease Outcome: Progressing   Problem: Elimination: Goal: Will not experience complications related to bowel motility Outcome: Progressing Goal: Will not experience complications related to urinary retention Outcome: Progressing   Problem: Pain Managment: Goal: General experience of comfort will improve Outcome: Progressing   Problem: Safety: Goal: Ability to remain free from injury will improve Outcome: Progressing   Problem: Skin Integrity: Goal: Risk for impaired skin integrity will decrease Outcome: Progressing   Problem: Education: Goal: Ability to describe self-care measures that may prevent or decrease complications (Diabetes Survival Skills Education) will improve Outcome: Progressing   Problem: Coping: Goal: Ability to adjust to condition or change in health will improve Outcome: Progressing   Problem: Fluid Volume: Goal: Ability to maintain a balanced intake and output will improve Outcome: Progressing   Problem: Health Behavior/Discharge Planning: Goal: Ability to identify and utilize available resources and services will improve Outcome: Progressing Goal: Ability to manage health-related needs will improve Outcome: Progressing   Problem: Metabolic: Goal: Ability to maintain appropriate glucose levels will improve Outcome: Progressing   Problem: Nutritional: Goal: Maintenance of adequate nutrition will improve Outcome: Progressing Goal: Progress toward achieving an optimal weight will improve Outcome: Progressing   Problem: Skin Integrity: Goal: Risk for impaired skin integrity will decrease Outcome: Progressing   Problem: Tissue Perfusion: Goal: Adequacy of tissue perfusion will improve Outcome: Progressing

## 2021-11-04 DIAGNOSIS — J9622 Acute and chronic respiratory failure with hypercapnia: Secondary | ICD-10-CM | POA: Diagnosis not present

## 2021-11-04 DIAGNOSIS — J9621 Acute and chronic respiratory failure with hypoxia: Secondary | ICD-10-CM | POA: Diagnosis not present

## 2021-11-04 MED ORDER — OXYCODONE HCL 5 MG PO TABS
10.0000 mg | ORAL_TABLET | Freq: Three times a day (TID) | ORAL | Status: DC | PRN
  Administered 2021-11-05 – 2021-11-07 (×5): 10 mg via ORAL
  Filled 2021-11-04 (×6): qty 2

## 2021-11-04 MED ORDER — PANTOPRAZOLE SODIUM 40 MG PO TBEC
40.0000 mg | DELAYED_RELEASE_TABLET | Freq: Two times a day (BID) | ORAL | Status: DC
  Administered 2021-11-05 – 2021-11-15 (×21): 40 mg via ORAL
  Filled 2021-11-04 (×22): qty 1

## 2021-11-04 NOTE — Progress Notes (Signed)
In PROGRESS NOTE    Gary [name]  CHE:[id number] DOB: 20-Jan-1956  DOA: 09/19/2021 Date of Service: 11/04/21 PCP: [facility], [facility] Course:  66 y/o male with multiple medical problems causing chronic hypercapneic respiratory failure who has had admissions in the past for exacerbations of the same and mechanical ventilation presented for evaluation of dyspnea, weakness. It is unclear if the patient has been compliant with his medications and NIMV at home.  He was admitted by the hospitalists for a presumed COPD exacerbation in the ER, treated with bronchodilators and NIMV but his mental status and hypercarbia worsened despite those interventions so he required intubation.   6/6: Presented to ED.  Required intubation and mechanical ventilation in the ED.  PCCM admit ICU 6/7: Hold diuresis due to worsening Creatinine.  ENT consulted for Trach placement per family request 6/8: Pt continues to spike temps tmax 101.8 degrees F.  Flexiseal placed overnight draining foul smelling watery stool~Cdiff and GI panel pending  6/9: Fever resolved.  Negative for C- diff, GI panel still pending.  Creatinine slightly worsened, requiring low dose levophed (2 mcg). Holding diuresis 6/10: propofol discontinued due hypertriglyceridemia; failed SBT 6/11: Tracheal aspirate from 6/6 resulting with Pseudomonas, ABX changed to Cefepime. neurologically intact, awaiting Trach placement 6/12: Pt calmer today following addition of oral benzo's, narcotics, and Seroquel via tube.  Creatinine worsened with decreased UOP, holding diuresis, low threshold for Nephrology consult  6/13: Pt with worsening renal failure creatinine 4.15.  UOP overnight 25 ml.  Nephrology consulted plans to start pt on hemodialysis follow dialysis catheter placement Pt underwent hemodialysis without ultrafiltration 06/14: .  Plans for HD session today.  Agitation has improved significantly with narcotics and antianxiety medications per  tube   06/16: Tracheostomy canceled 06/15 due to hyperkalemia and tentatively rescheduled for 06/20  6/18 failure to wean from vent 06/20: ENT placed Tracheostomy (#7.0 XLT cuffed).  Developed mild epistaxis post unsuccessful attempt at NGT placement.  Later developed hematochezia/melena, Protonix gtt started and GI consulted. 06/21: Diuresed yesterday with limited results. Patient had softed blood pressures throughout the day and diuresis was held as a result. HD was performed by the patient clotted off the machine and was not able to be rinsed back. Repeat hemoglobin is above 8. 06/23: Pt remains mechanically intubated settings: FiO2 50%/PEEP 10.  CXR concerning for moderate right pleural effusion with associated right basilar atelectasis vs. infiltrate.  Will perform [place] Chest to determine if pt needs right sided thoracentesis  06/24: Remains on the ventilator, FiO2 requirements decreasing, respiratory culture with abundant gram-negative rods: [name], on Meropenem 06/25: Oxygen requirements down to 40%, chest x-ray shows improvement on dense right consolidation, Pseudomonas susceptibilities pending 06/25: Pt remains on the ventilator settings: PEEP 10 and FiO2 40%; will attempt to wean PEEP to 5 to perform SBT  06/28: Pt tolerated PS on 06/27.  Overnight pt had episodes of vomiting TF's suspect secondary to malposition of nasogastric tube.  Will consult IR for PEG tube placement if unable to place PEG tube pt will need postpyloric Dobbhoff   06/29: Pt tolerated PS 5/5 for over an hour yesterday, however due to fatigue placed back on PRVC.  Pt pulled NGT out several times overnight, therefore will not attempt to reinsert.  Plans for PEG tube placement per IR on 06/30.   06/30: PEG tube placed per IR  07/1: Pt tolerated trach collar '@40'$ % throughout the day 07/2: Pt placed on ventilator overnight settings 16/5/35% tolerating well.  Will place pt on trach collar today and place orders for  PT/OT 7/3: Hemodynamically stable.  Tolerating TC (has remained on TC since 7/2 @ 08:00), plan for speech therapy and PT/OT 7/4: Has maintained on trach collar for greater than 48 hours, tolerating Passy-Muir valve.  Plan to transfer out of ICU. SLP recs: dysphagia 1 (puree) w/ nectar liquid.  7/5-7/6: sodium and creatinine trending appropriately, Hgb stable. Low K repleted and stable. Was off vent qhs (orders d/c or missed?) and was restarted. Will need LTAC, TOC following  7/7: pulled out trach overnight. Confusion re: vent plan among staff and myself. I asked PCCM to reevluate him and verify plan so the team and the patient can eb on the same page, and will help TOC arrange appropriate placement.  7/8: I spoke personally w/ Dr Milon Dikes [facility]) and he confirms patient does NOT need ventilator support, unless of course develops distress, but last night the patient tolerated well without it. Plan to meet w/ family today when they visit, I spoke w/ his sister yesterday on the phone. No concerns on CBC/BMP: Cr tending down a bit, Hgb stable, Na slightly high but improving over past few days. He is approaching medically stable and may be appropriate for LTAC vs SNF soon  7/9: Several long discussions with patient and family about medical condition, behavioral issues.  See separate progress notes.  Psychiatry evaluated, agree that he has capacity, started SSRI. 7/10: Patient remains medically stable, awaiting placement.  Discussed his behavioral issues again and encouraged him to always bring any concerns to nursing staff or to me calmly and I am happy to listen, requested he maintain professionalism towards staff, he seems amenable. 7/11-7/14: goal OOB to chair, needing hoyer lift, pt reeducated several times about staying in bed 07/11 and has been compliant through 07/14. Finally got bariatric chair and hoyer lift 7/12 and was OOB 07/13.  D/c monitoring and sitter and transferred to [place] status 07/13. Awaiting  placement.  7/15-17- working to reorient patient, but personality and coherence remain difficult. Stable for placement. Reminded patient not to verbally abuse staff  Consultants:  PCCU Nephrology ENT GI IR Psychiatry  Procedures: 10/03/21: tracheostomy placement 10/12/21: dialysis permacath placement  10/13/21: G-tube placed  Subjective: No events overnight.  Foot pain much improved today after debridement yesterday and removal of blood blister.   ASSESSMENT & PLAN:   Principal Problem:   Acute on chronic respiratory failure with hypoxia and hypercapnia ([name]) Active Problems:   Major depressive disorder, recurrent episode, moderate ([name])   Acute on chronic diastolic congestive heart failure ([name])   COPD with acute exacerbation ([name])   Acute metabolic encephalopathy   HTN (hypertension)   Obesity, Class III, BMI 40-49.9 (morbid obesity) ([place])   Obstructive sleep apnea   Chronic kidney disease, stage 3a ([name])   HLD (hyperlipidemia)   Pressure injury of skin  Left foot blood blister - appreciate podiatry evaluation; underwent removal of blister bedside with podiatry on 7/21 - prevalon boots ordered to aid with pressure wounds going forward  Acute on Chronic Hypoxic & Hypercapnic Respiratory Failure in the setting of acute decompensated HFpEF, AECOPD, Pseudomonas Pneumonia, & OSA/OHS Moderate right sided pleural effusion -ruled out, actually lung Consolidation --> STABLE S/p  tracheostomy by ENT by 6/20 Trach collar w/ ventilator support as needed / qhs but he does NOT need vent supoort overnight routinely per conversation w/ Dr Milon Dikes 07/08 Completed 10 day course of ceftazidime for treatment of Pseudomonas At this point, he  is medically stable but placement is going to be an issue for him.  His behavioral issues have been discussed with him and his behavior has improved    Acute decompensated HFpEF PMHx: Hypertension --> RESOLVED/STABLE  Echocardiogram 08/07/2021: LVEF  60 to 17%, grade 1 diastolic dysfunction, normal RV systolic function Was on intermittent hemodialysis for volume removal - has not needed HD and has remained compensated. Cancelled permacath placement    AKI superimposed CKD stage IIIa  Hypokalemia  Hypernatremia --> RESOLVED During admissions in February 2023, creatinine 1.8 with GFR 40; in May 2023 creatinine 1.1 with GFR of 60 Monitor I&O's / urinary output Follow BMP  Cr down, good UOP noted, d/c dialysis catheter, monitor renal parameters daily. 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 Diff negative   Acute Blood Loss Anemia --> RESOLVED Nasopharyngeal trauma vs Upper GI bleed Trend CBC  Monitor for s/sx of bleeding and transfuse for Hgb <7 Continue BID IV PPI per GI, no need for scope    Hyperglycemia --> STABLE CBG's achs SSI  Acute Metabolic Encephalopathy in setting of CO2 Narcosis Sedation needs in setting of mechanical ventilation --> RESOLVED CT Head 09/19/21: negative;  UDS negative Treatment of hypercapnia as outlined above  Trach in place Gtube in place Was Requiring ventilatory support at night, has done well without this Working on increasing po intake, SLP and dietary following  Has not required ventilatory support, doing well on O2 via the trach Continues to eat well; G-tube will have to remain in place at least 3 months prior to removal   DVT prophylaxis:lovenox Code Status: FULL Disposition Plan / TOC needs: to med-surg 07/13, awaiting placement  Barriers to discharge / significant pending items: remains on trach; behavioral issues  Objective: Vitals:   11/03/21 2007 11/04/21 0500 11/04/21 0530 11/04/21 0829  BP:   127/86 125/85  Pulse:   75 81  Resp:   18 16  Temp:   97.8 F (36.6  C) 97.7 F (36.5 C)  TempSrc:   Oral Oral  SpO2: 96%  99% 95%  Weight:  (!) 191.4 kg    Height:        Intake/Output Summary (Last 24 hours) at 11/04/2021 1547 Last data filed at 11/04/2021 0017 Gross per 24 hour  Intake 240 ml  Output 1000 ml  Net -760 ml    Filed Weights   11/02/21 0300 11/03/21 0500 11/04/21 0500  Weight: (!) 190.1 kg (!) 191.4 kg (!) 191.4 kg   Physical Exam: Physical Exam Constitutional:      General: He is not in acute distress.    Appearance: Normal appearance. He is obese. He is not ill-appearing.  HENT:     Head: Normocephalic and atraumatic.     Mouth/Throat:     Mouth: Mucous membranes are moist.  Eyes:     Extraocular Movements: Extraocular movements intact.  Neck:     Comments: Trach in place Pulmonary:     Effort: No respiratory distress.     Breath sounds: Normal breath sounds.  Abdominal:     General: Bowel sounds are normal. There is no distension.     Palpations: Abdomen is soft.  Musculoskeletal:        General: Normal range of motion.     Cervical back: Normal range of motion.  Skin:  General: Skin is warm.  Neurological:     General: No focal deficit present.     Mental Status: He is alert.  Psychiatric:        Mood and Affect: Mood normal.     Scheduled Medications:   atorvastatin  10 mg Per Tube Daily   budesonide (PULMICORT) nebulizer solution  0.5 mg Nebulization BID   Chlorhexidine Gluconate Cloth  6 each Topical Daily   cholecalciferol  2,000 Units Per Tube Daily   enoxaparin (LOVENOX) injection  0.5 mg/kg Subcutaneous Q24H   escitalopram  5 mg Oral Daily   feeding supplement (PROSource TF)  90 mL Per Tube TID   free water  30 mL Per Tube Q4H   nutrition supplement (JUVEN)  1 packet Per Tube BID BM   mouth rinse  15 mL Mouth Rinse 4 times per day   pantoprazole sodium  40 mg Per Tube BID   traZODone  50 mg Oral QHS    Continuous Infusions:  sodium chloride Stopped (10/07/21 0542)    PRN Medications:   acetaminophen, acetaminophen, albuterol, benzonatate, bisacodyl, clonazepam, guaiFENesin, hydrOXYzine, loperamide, Muscle Rub, nitroGLYCERIN, mouth rinse, oxyCODONE, pentafluoroprop-tetrafluoroeth, polyethylene glycol, QUEtiapine  Antimicrobials:  Anti-infectives (From admission, onward)    Start     Dose/Rate Route Frequency Ordered Stop   10/12/21 1200  cefTAZidime (FORTAZ) 2 g in sodium chloride 0.9 % 100 mL IVPB        2 g 200 mL/hr over 30 Minutes Intravenous 2 times daily 10/12/21 1122 10/17/21 0844   10/12/21 0417  ceFAZolin (ANCEF) IVPB 1 g/50 mL premix  Status:  Discontinued        1 g 100 mL/hr over 30 Minutes Intravenous 30 min pre-op 10/12/21 0417 10/16/21 0751   10/09/21 2000  ceFEPIme (MAXIPIME) 1 g in sodium chloride 0.9 % 100 mL IVPB  Status:  Discontinued        1 g 200 mL/hr over 30 Minutes Intravenous Every 24 hours 10/09/21 1404 10/09/21 1515   10/09/21 2000  cefTAZidime (FORTAZ) 1 g in sodium chloride 0.9 % 100 mL IVPB  Status:  Discontinued        1 g 200 mL/hr over 30 Minutes Intravenous Every 24 hours 10/09/21 1515 10/12/21 1122   10/06/21 1830  meropenem (MERREM) 500 mg in sodium chloride 0.9 % 100 mL IVPB  Status:  Discontinued        500 mg 200 mL/hr over 30 Minutes Intravenous Every 24 hours 10/06/21 1735 10/09/21 1403   10/06/21 1800  doxycycline (VIBRAMYCIN) 100 mg in sodium chloride 0.9 % 250 mL IVPB  Status:  Discontinued        100 mg 125 mL/hr over 120 Minutes Intravenous Every 12 hours 10/06/21 1657 10/08/21 0838   09/29/21 1000  ceFEPIme (MAXIPIME) 2 g in sodium chloride 0.9 % 100 mL IVPB        2 g 200 mL/hr over 30 Minutes Intravenous Every 12 hours 09/29/21 0812 09/30/21 2342   09/26/21 2200  ceFEPIme (MAXIPIME) 1 g in sodium chloride 0.9 % 100 mL IVPB  Status:  Discontinued        1 g 200 mL/hr over 30 Minutes Intravenous Every 24 hours 09/26/21 1154 09/29/21 0812   09/26/21 2000  ceFEPIme (MAXIPIME) 2 g in sodium chloride 0.9 % 100 mL IVPB   Status:  Discontinued        2 g 200 mL/hr over 30 Minutes Intravenous Every 12 hours 09/26/21 0809 09/26/21 1154  09/24/21 1200  ceFEPIme (MAXIPIME) 2 g in sodium chloride 0.9 % 100 mL IVPB  Status:  Discontinued        2 g 200 mL/hr over 30 Minutes Intravenous Every 8 hours 09/24/21 0950 09/26/21 0809   09/21/21 1430  azithromycin (ZITHROMAX) 500 mg in sodium chloride 0.9 % 250 mL IVPB        500 mg 250 mL/hr over 60 Minutes Intravenous Every 24 hours 09/21/21 1344 09/23/21 1543   09/21/21 1430  cefTRIAXone (ROCEPHIN) 2 g in sodium chloride 0.9 % 100 mL IVPB  Status:  Discontinued        2 g 200 mL/hr over 30 Minutes Intravenous Every 24 hours 09/21/21 1344 09/24/21 0922       Data Reviewed: I have personally reviewed following labs and imaging studies  CBC: Recent Labs  Lab 10/28/21 1708 10/31/21 0519 11/02/21 0530 11/03/21 0503  WBC 8.1 7.5 7.2 5.2  NEUTROABS  --   --  4.2 3.1  HGB 9.6* 9.2* 9.3* 9.9*  HCT 35.1* 34.4* 35.1* 37.6*  MCV 83.0 85.4 85.6 85.6  PLT 143* 136* 183 650    Basic Metabolic Panel: Recent Labs  Lab 10/28/21 1708 11/02/21 0530 11/03/21 0503  NA 143 141 143  K 4.0 4.0 4.5  CL 108 106 109  CO2 31 31 32  GLUCOSE 80 84 86  BUN 34* 20 17  CREATININE 1.17 1.01 1.06  CALCIUM 8.3* 8.4* 8.6*  MG  --  1.5* 2.0    GFR: Estimated Creatinine Clearance: 126.8 mL/min (by C-G formula based on SCr of 1.06 mg/dL). Liver Function Tests: No results for input(s): "AST", "ALT", "ALKPHOS", "BILITOT", "PROT", "ALBUMIN" in the last 168 hours.  No results for input(s): "LIPASE", "AMYLASE" in the last 168 hours. No results for input(s): "AMMONIA" in the last 168 hours. Coagulation Profile: No results for input(s): "INR", "PROTIME" in the last 168 hours. Cardiac Enzymes: No results for input(s): "CKTOTAL", "CKMB", "CKMBINDEX", "TROPONINI" in the last 168 hours. BNP (last 3 results) No results for input(s): "PROBNP" in the last 8760 hours. HbA1C: No results  for input(s): "HGBA1C" in the last 72 hours. CBG: Recent Labs  Lab 11/01/21 1629 11/01/21 2024 11/02/21 0147 11/02/21 0610 11/03/21 1950  GLUCAP 99 115* 107* 81 112*    Lipid Profile: No results for input(s): "CHOL", "HDL", "LDLCALC", "TRIG", "CHOLHDL", "LDLDIRECT" in the last 72 hours. Thyroid Function Tests: No results for input(s): "TSH", "T4TOTAL", "FREET4", "T3FREE", "THYROIDAB" in the last 72 hours. Anemia Panel: No results for input(s): "VITAMINB12", "FOLATE", "FERRITIN", "TIBC", "IRON", "RETICCTPCT" in the last 72 hours. Urine analysis:    Component Value Date/Time   COLORURINE STRAW (A) 09/19/2021 1247   APPEARANCEUR CLEAR (A) 09/19/2021 1247   APPEARANCEUR Clear 04/15/2014 1240   LABSPEC 1.009 09/19/2021 1247   LABSPEC 1.013 04/15/2014 1240   PHURINE 5.0 09/19/2021 1247   GLUCOSEU 150 (A) 09/19/2021 1247   GLUCOSEU Negative 04/15/2014 1240   HGBUR NEGATIVE 09/19/2021 1247   BILIRUBINUR NEGATIVE 09/19/2021 1247   BILIRUBINUR Negative 04/15/2014 1240   KETONESUR NEGATIVE 09/19/2021 1247   PROTEINUR NEGATIVE 09/19/2021 1247   NITRITE NEGATIVE 09/19/2021 1247   LEUKOCYTESUR NEGATIVE 09/19/2021 1247   LEUKOCYTESUR Negative 04/15/2014 1240   Sepsis Labs: '@LABRCNTIP'$ (procalcitonin:4,lacticidven:4)  No results found for this or any previous visit (from the past 240 hour(s)).      Radiology Studies last 96 hours: No results found.     LOS: 46 days     Dwyane Dee, MD [facility] 11/04/2021, 3:47 PM    If 7PM-7AM, please contact night-coverage [email or web address]

## 2021-11-04 NOTE — Plan of Care (Signed)
  Problem: Education: Goal: Ability to demonstrate management of disease process will improve Outcome: Progressing Goal: Ability to verbalize understanding of medication therapies will improve Outcome: Progressing   Problem: Activity: Goal: Capacity to carry out activities will improve Outcome: Progressing   Problem: Cardiac: Goal: Ability to achieve and maintain adequate cardiopulmonary perfusion will improve Outcome: Progressing   

## 2021-11-05 DIAGNOSIS — J9621 Acute and chronic respiratory failure with hypoxia: Secondary | ICD-10-CM | POA: Diagnosis not present

## 2021-11-05 DIAGNOSIS — J9622 Acute and chronic respiratory failure with hypercapnia: Secondary | ICD-10-CM | POA: Diagnosis not present

## 2021-11-05 MED ORDER — CLONAZEPAM 0.25 MG PO TBDP
0.5000 mg | ORAL_TABLET | Freq: Two times a day (BID) | ORAL | Status: DC | PRN
  Administered 2021-11-06 – 2021-12-12 (×30): 0.5 mg via ORAL
  Filled 2021-11-05: qty 1
  Filled 2021-11-05 (×7): qty 2
  Filled 2021-11-05 (×2): qty 1
  Filled 2021-11-05 (×4): qty 2
  Filled 2021-11-05: qty 1
  Filled 2021-11-05: qty 2
  Filled 2021-11-05: qty 1
  Filled 2021-11-05: qty 2
  Filled 2021-11-05 (×3): qty 1
  Filled 2021-11-05 (×2): qty 2
  Filled 2021-11-05 (×2): qty 1
  Filled 2021-11-05: qty 2
  Filled 2021-11-05: qty 1
  Filled 2021-11-05: qty 2
  Filled 2021-11-05 (×2): qty 1

## 2021-11-05 NOTE — Progress Notes (Signed)
Attempted to check patients CBG. Patient refused. Writer educated patient on the importance of checking CBG and the patient reported "my fingers are swelling from all the finger sticks and I just cant handle the pain anymore. Ive told them Im not doing it anymore." RN notified

## 2021-11-05 NOTE — Progress Notes (Signed)
Pt continues to refuse PEG tube access and flushes. Also has been refusing finger stick blood sugar checks since night of 7/21. Pt alert and oriented, education given but still refuses. Night hospitalist informed. Will continue to monitor pt.

## 2021-11-05 NOTE — Progress Notes (Signed)
In PROGRESS NOTE    Gary [name]  TIR:[id number] DOB: [birth year]  DOA: 09/19/2021 Date of Service: 11/05/21 PCP: [facility] Course:  66 y/o male with multiple medical problems causing chronic hypercapneic respiratory failure who has had admissions in the past for exacerbations of the same and mechanical ventilation presented for evaluation of dyspnea, weakness. It is unclear if the patient has been compliant with his medications and NIMV at home.  He was admitted by the hospitalists for a presumed COPD exacerbation in the ER, treated with bronchodilators and NIMV but his mental status and hypercarbia worsened despite those interventions so he required intubation.   6/6: Presented to ED.  Required intubation and mechanical ventilation in the ED.  PCCM admit ICU 6/7: Hold diuresis due to worsening Creatinine.  ENT consulted for Trach placement per family request 6/8: Pt continues to spike temps tmax 101.8 degrees F.  Flexiseal placed overnight draining foul smelling watery stool~Cdiff and GI panel pending  6/9: Fever resolved.  Negative for C- diff, GI panel still pending.  Creatinine slightly worsened, requiring low dose levophed (2 mcg). Holding diuresis 6/10: propofol discontinued due hypertriglyceridemia; failed SBT 6/11: Tracheal aspirate from 6/6 resulting with Pseudomonas, ABX changed to Cefepime. neurologically intact, awaiting Trach placement 6/12: Pt calmer today following addition of oral benzo's, narcotics, and Seroquel via tube.  Creatinine worsened with decreased UOP, holding diuresis, low threshold for Nephrology consult  6/13: Pt with worsening renal failure creatinine 4.15.  UOP overnight 25 ml.  Nephrology consulted plans to start pt on hemodialysis follow dialysis catheter placement Pt underwent hemodialysis without ultrafiltration 06/14: .  Plans for HD session today.  Agitation has improved significantly with narcotics and antianxiety medications per  tube   06/16: Tracheostomy canceled 06/15 due to hyperkalemia and tentatively rescheduled for 06/20  6/18 failure to wean from vent 06/20: ENT placed Tracheostomy (#7.0 XLT cuffed).  Developed mild epistaxis post unsuccessful attempt at NGT placement.  Later developed hematochezia/melena, Protonix gtt started and GI consulted. 06/21: Diuresed yesterday with limited results. Patient had softed blood pressures throughout the day and diuresis was held as a result. HD was performed by the patient clotted off the machine and was not able to be rinsed back. Repeat hemoglobin is above 8. 06/23: Pt remains mechanically intubated settings: FiO2 50%/PEEP 10.  CXR concerning for moderate right pleural effusion with associated right basilar atelectasis vs. infiltrate.  Will perform [place] Chest to determine if pt needs right sided thoracentesis  06/24: Remains on the ventilator, FiO2 requirements decreasing, respiratory culture with abundant gram-negative rods: [name], on Meropenem 06/25: Oxygen requirements down to 40%, chest x-ray shows improvement on dense right consolidation, Pseudomonas susceptibilities pending 06/25: Pt remains on the ventilator settings: PEEP 10 and FiO2 40%; will attempt to wean PEEP to 5 to perform SBT  06/28: Pt tolerated PS on 06/27.  Overnight pt had episodes of vomiting TF's suspect secondary to malposition of nasogastric tube.  Will consult IR for PEG tube placement if unable to place PEG tube pt will need postpyloric Dobbhoff   06/29: Pt tolerated PS 5/5 for over an hour yesterday, however due to fatigue placed back on PRVC.  Pt pulled NGT out several times overnight, therefore will not attempt to reinsert.  Plans for PEG tube placement per IR on 06/30.   06/30: PEG tube placed per IR  07/1: Pt tolerated trach collar '@40'$ % throughout the day 07/2: Pt placed on ventilator overnight settings 16/5/35% tolerating well.  Will place pt on trach collar today and place orders for  PT/OT 7/3: Hemodynamically stable.  Tolerating TC (has remained on TC since 7/2 @ 08:00), plan for speech therapy and PT/OT 7/4: Has maintained on trach collar for greater than 48 hours, tolerating Passy-Muir valve.  Plan to transfer out of ICU. SLP recs: dysphagia 1 (puree) w/ nectar liquid.  7/5-7/6: sodium and creatinine trending appropriately, Hgb stable. Low K repleted and stable. Was off vent qhs (orders d/c or missed?) and was restarted. Will need LTAC, TOC following  7/7: pulled out trach overnight. Confusion re: vent plan among staff and myself. I asked PCCM to reevluate him and verify plan so the team and the patient can eb on the same page, and will help TOC arrange appropriate placement.  7/8: I spoke personally w/ Dr Milon Dikes [facility]) and he confirms patient does NOT need ventilator support, unless of course develops distress, but last night the patient tolerated well without it. Plan to meet w/ family today when they visit, I spoke w/ his sister yesterday on the phone. No concerns on CBC/BMP: Cr tending down a bit, Hgb stable, Na slightly high but improving over past few days. He is approaching medically stable and may be appropriate for LTAC vs SNF soon  7/9: Several long discussions with patient and family about medical condition, behavioral issues.  See separate progress notes.  Psychiatry evaluated, agree that he has capacity, started SSRI. 7/10: Patient remains medically stable, awaiting placement.  Discussed his behavioral issues again and encouraged him to always bring any concerns to nursing staff or to me calmly and I am happy to listen, requested he maintain professionalism towards staff, he seems amenable. 7/11-7/14: goal OOB to chair, needing hoyer lift, pt reeducated several times about staying in bed 07/11 and has been compliant through 07/14. Finally got bariatric chair and hoyer lift 7/12 and was OOB 07/13.  D/c monitoring and sitter and transferred to [facility] status 07/13. Awaiting  placement.  7/15-17- working to reorient patient, but personality and coherence remain difficult. Stable for placement. Reminded patient not to verbally abuse staff  Consultants:  PCCU Nephrology ENT GI IR Psychiatry  Procedures: 10/03/21: tracheostomy placement 10/12/21: dialysis permacath placement  10/13/21: G-tube placed  Subjective: No events overnight.  Still refusing nocturnal tube feeds and PEG tube flushes.  Also refusing fingersticks for CBG checks.   ASSESSMENT & PLAN:   Principal Problem:   Acute on chronic respiratory failure with hypoxia and hypercapnia ([name]) Active Problems:   Major depressive disorder, recurrent episode, moderate ([name])   Acute on chronic diastolic congestive heart failure ([name])   COPD with acute exacerbation ([name])   Acute metabolic encephalopathy   HTN (hypertension)   Obesity, Class III, BMI 40-49.9 (morbid obesity) ([place])   Obstructive sleep apnea   Chronic kidney disease, stage 3a ([name])   HLD (hyperlipidemia)   Pressure injury of skin  Left foot blood blister - appreciate podiatry evaluation; underwent removal of blister bedside with podiatry on 7/21 - prevalon boots ordered to aid with pressure wounds going forward  Acute on Chronic Hypoxic Hypercapnic Respiratory Failure Acute exacerbation COPD Pseudomonal Pna OSA/OHS - S/p  tracheostomy by ENT by 6/20 - Trach collar w/ ventilator support as needed / qhs but he does NOT need vent supoort overnight routinely per conversation w/ Dr Milon Dikes 07/08 - Completed 10 day course of ceftazidime for treatment of Pseudomonas - At this point, he is medically stable but placement is going to be an issue for  him.  His behavioral issues have been discussed with him and his behavior has improved    Acute decompensated HFpEF Hypertension - Echocardiogram 08/07/2021: LVEF 60 to 72%, grade 1 diastolic dysfunction, normal RV systolic function - Was on intermittent hemodialysis for volume removal  - has  not needed HD and has remained compensated. Cancelled permacath placement    AKI superimposed CKD stage IIIa  Hypokalemia  Hypernatremia - During admissions in February 2023, creatinine 1.8 with GFR 40; in May 2023 creatinine 1.1 with GFR of 60 - Monitor I&O's / urinary output Follow BMP  - Cr down, good UOP noted, d/c dialysis catheter, monitor renal parameters daily. -cancelled permacath placement    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 - resolved  Pseudomonas Pneumonia  - Completed 10 day course of ceftazidime (started on 06/26) - C. Diff negative   Acute Blood Loss Anemia - resolved  Nasopharyngeal trauma vs Upper GI bleed - Continue BID IV PPI - per GI, no need for scope    Hyperglycemia - CBG's achs; patient refusing at times - SSI  Acute Metabolic Encephalopathy in setting of CO2 Narcosis Sedation needs in setting of mechanical ventilation - CT Head 09/19/21: negative;  UDS negative - Treatment of hypercapnia as outlined above  Trach in place Gtube in place - Was Requiring ventilatory support at night, has done well without this - Working on increasing po intake, SLP and dietary following  - Has not required ventilatory support, doing well on O2 via the trach - Continues to eat well; G-tube will have to remain in place at least 3 months prior to removal   DVT prophylaxis:lovenox Code Status: FULL Disposition Plan / TOC needs: to med-surg 07/13, awaiting placement  Barriers to discharge / significant pending items: remains on trach; behavioral issues  Objective: Vitals:   11/05/21 0030 11/05/21 0321 11/05/21 0443 11/05/21 0505  BP:    119/79  Pulse: 76 80  82  Resp: '20 18  18  '$ Temp:      TempSrc:      SpO2: 90% 91%  90%  Weight:   (!) 190.5 kg   Height:        Intake/Output Summary (Last 24 hours) at 11/05/2021 1320 Last data filed at 11/05/2021 0616 Gross per 24 hour  Intake 480 ml  Output 995 ml  Net -515 ml    Filed Weights   11/03/21  0500 11/04/21 0500 11/05/21 0443  Weight: (!) 191.4 kg (!) 191.4 kg (!) 190.5 kg   Physical Exam: Physical Exam Constitutional:      General: He is not in acute distress.    Appearance: Normal appearance. He is obese. He is not ill-appearing.  HENT:     Head: Normocephalic and atraumatic.     Mouth/Throat:     Mouth: Mucous membranes are moist.  Eyes:     Extraocular Movements: Extraocular movements intact.  Neck:     Comments: Trach in place Pulmonary:     Effort: No respiratory distress.     Breath sounds: Normal breath sounds.  Abdominal:     General: Bowel sounds are normal. There is no distension.     Palpations: Abdomen is soft.  Musculoskeletal:        General: Normal range of motion.     Cervical back: Normal range of motion.  Skin:    General: Skin is warm.  Neurological:     General: No focal deficit present.     Mental Status: He is alert.  Psychiatric:        Mood and Affect: Mood normal.     Scheduled Medications:   atorvastatin  10 mg Per Tube Daily   budesonide (PULMICORT) nebulizer solution  0.5 mg Nebulization BID   Chlorhexidine Gluconate Cloth  6 each Topical Daily   cholecalciferol  2,000 Units Per Tube Daily   enoxaparin (LOVENOX) injection  0.5 mg/kg Subcutaneous Q24H   escitalopram  5 mg Oral Daily   feeding supplement (PROSource TF)  90 mL Per Tube TID   free water  30 mL Per Tube Q4H   nutrition supplement (JUVEN)  1 packet Per Tube BID BM   mouth rinse  15 mL Mouth Rinse 4 times per day   pantoprazole  40 mg Oral BID   traZODone  50 mg Oral QHS    Continuous Infusions:  sodium chloride Stopped (10/07/21 0542)    PRN Medications:  acetaminophen, acetaminophen, albuterol, benzonatate, bisacodyl, clonazepam, guaiFENesin, hydrOXYzine, loperamide, Muscle Rub, nitroGLYCERIN, mouth rinse, oxyCODONE, pentafluoroprop-tetrafluoroeth, polyethylene glycol, QUEtiapine  Antimicrobials:  Anti-infectives (From admission, onward)    Start      Dose/Rate Route Frequency Ordered Stop   10/12/21 1200  cefTAZidime (FORTAZ) 2 g in sodium chloride 0.9 % 100 mL IVPB        2 g 200 mL/hr over 30 Minutes Intravenous 2 times daily 10/12/21 1122 10/17/21 0844   10/12/21 0417  ceFAZolin (ANCEF) IVPB 1 g/50 mL premix  Status:  Discontinued        1 g 100 mL/hr over 30 Minutes Intravenous 30 min pre-op 10/12/21 0417 10/16/21 0751   10/09/21 2000  ceFEPIme (MAXIPIME) 1 g in sodium chloride 0.9 % 100 mL IVPB  Status:  Discontinued        1 g 200 mL/hr over 30 Minutes Intravenous Every 24 hours 10/09/21 1404 10/09/21 1515   10/09/21 2000  cefTAZidime (FORTAZ) 1 g in sodium chloride 0.9 % 100 mL IVPB  Status:  Discontinued        1 g 200 mL/hr over 30 Minutes Intravenous Every 24 hours 10/09/21 1515 10/12/21 1122   10/06/21 1830  meropenem (MERREM) 500 mg in sodium chloride 0.9 % 100 mL IVPB  Status:  Discontinued        500 mg 200 mL/hr over 30 Minutes Intravenous Every 24 hours 10/06/21 1735 10/09/21 1403   10/06/21 1800  doxycycline (VIBRAMYCIN) 100 mg in sodium chloride 0.9 % 250 mL IVPB  Status:  Discontinued        100 mg 125 mL/hr over 120 Minutes Intravenous Every 12 hours 10/06/21 1657 10/08/21 0838   09/29/21 1000  ceFEPIme (MAXIPIME) 2 g in sodium chloride 0.9 % 100 mL IVPB        2 g 200 mL/hr over 30 Minutes Intravenous Every 12 hours 09/29/21 0812 09/30/21 2342   09/26/21 2200  ceFEPIme (MAXIPIME) 1 g in sodium chloride 0.9 % 100 mL IVPB  Status:  Discontinued        1 g 200 mL/hr over 30 Minutes Intravenous Every 24 hours 09/26/21 1154 09/29/21 0812   09/26/21 2000  ceFEPIme (MAXIPIME) 2 g in sodium chloride 0.9 % 100 mL IVPB  Status:  Discontinued        2 g 200 mL/hr over 30 Minutes Intravenous Every 12 hours 09/26/21 0809 09/26/21 1154   09/24/21 1200  ceFEPIme (MAXIPIME) 2 g in sodium chloride 0.9 % 100 mL IVPB  Status:  Discontinued        2 g  200 mL/hr over 30 Minutes Intravenous Every 8 hours 09/24/21 0950 09/26/21 0809    09/21/21 1430  azithromycin (ZITHROMAX) 500 mg in sodium chloride 0.9 % 250 mL IVPB        500 mg 250 mL/hr over 60 Minutes Intravenous Every 24 hours 09/21/21 1344 09/23/21 1543   09/21/21 1430  cefTRIAXone (ROCEPHIN) 2 g in sodium chloride 0.9 % 100 mL IVPB  Status:  Discontinued        2 g 200 mL/hr over 30 Minutes Intravenous Every 24 hours 09/21/21 1344 09/24/21 0922       Data Reviewed: I have personally reviewed following labs and imaging studies  CBC: Recent Labs  Lab 10/31/21 0519 11/02/21 0530 11/03/21 0503  WBC 7.5 7.2 5.2  NEUTROABS  --  4.2 3.1  HGB 9.2* 9.3* 9.9*  HCT 34.4* 35.1* 37.6*  MCV 85.4 85.6 85.6  PLT 136* 183 093    Basic Metabolic Panel: Recent Labs  Lab 11/02/21 0530 11/03/21 0503  NA 141 143  K 4.0 4.5  CL 106 109  CO2 31 32  GLUCOSE 84 86  BUN 20 17  CREATININE 1.01 1.06  CALCIUM 8.4* 8.6*  MG 1.5* 2.0    GFR: Estimated Creatinine Clearance: 126.4 mL/min (by C-G formula based on SCr of 1.06 mg/dL). Liver Function Tests: No results for input(s): "AST", "ALT", "ALKPHOS", "BILITOT", "PROT", "ALBUMIN" in the last 168 hours.  No results for input(s): "LIPASE", "AMYLASE" in the last 168 hours. No results for input(s): "AMMONIA" in the last 168 hours. Coagulation Profile: No results for input(s): "INR", "PROTIME" in the last 168 hours. Cardiac Enzymes: No results for input(s): "CKTOTAL", "CKMB", "CKMBINDEX", "TROPONINI" in the last 168 hours. BNP (last 3 results) No results for input(s): "PROBNP" in the last 8760 hours. HbA1C: No results for input(s): "HGBA1C" in the last 72 hours. CBG: Recent Labs  Lab 11/01/21 1629 11/01/21 2024 11/02/21 0147 11/02/21 0610 11/03/21 1950  GLUCAP 99 115* 107* 81 112*    Lipid Profile: No results for input(s): "CHOL", "HDL", "LDLCALC", "TRIG", "CHOLHDL", "LDLDIRECT" in the last 72 hours. Thyroid Function Tests: No results for input(s): "TSH", "T4TOTAL", "FREET4", "T3FREE", "THYROIDAB" in  the last 72 hours. Anemia Panel: No results for input(s): "VITAMINB12", "FOLATE", "FERRITIN", "TIBC", "IRON", "RETICCTPCT" in the last 72 hours. Urine analysis:    Component Value Date/Time   COLORURINE STRAW (A) 09/19/2021 1247   APPEARANCEUR CLEAR (A) 09/19/2021 1247   APPEARANCEUR Clear 04/15/2014 1240   LABSPEC 1.009 09/19/2021 1247   LABSPEC 1.013 04/15/2014 1240   PHURINE 5.0 09/19/2021 1247   GLUCOSEU 150 (A) 09/19/2021 1247   GLUCOSEU Negative 04/15/2014 1240   HGBUR NEGATIVE 09/19/2021 1247   BILIRUBINUR NEGATIVE 09/19/2021 1247   BILIRUBINUR Negative 04/15/2014 1240   KETONESUR NEGATIVE 09/19/2021 1247   PROTEINUR NEGATIVE 09/19/2021 1247   NITRITE NEGATIVE 09/19/2021 1247   LEUKOCYTESUR NEGATIVE 09/19/2021 1247   LEUKOCYTESUR Negative 04/15/2014 1240   Sepsis Labs: '@LABRCNTIP'$ (procalcitonin:4,lacticidven:4)  No results found for this or any previous visit (from the past 240 hour(s)).      Radiology Studies last 96 hours: No results found.     LOS: 65 days     Dwyane Dee, MD [facility] 11/05/2021, 1:20 PM    If 7PM-7AM, please contact night-coverage [email or web address]

## 2021-11-05 NOTE — Progress Notes (Signed)
Patient refused vital signs this morning. MD notified. Patient continues to refuse peg tube care as well as blood sugar checks.

## 2021-11-06 DIAGNOSIS — J9621 Acute and chronic respiratory failure with hypoxia: Secondary | ICD-10-CM | POA: Diagnosis not present

## 2021-11-06 DIAGNOSIS — J9622 Acute and chronic respiratory failure with hypercapnia: Secondary | ICD-10-CM | POA: Diagnosis not present

## 2021-11-06 LAB — BASIC METABOLIC PANEL
Anion gap: 6 (ref 5–15)
BUN: 22 mg/dL (ref 8–23)
CO2: 33 mmol/L — ABNORMAL HIGH (ref 22–32)
Calcium: 8.6 mg/dL — ABNORMAL LOW (ref 8.9–10.3)
Chloride: 104 mmol/L (ref 98–111)
Creatinine, Ser: 1.3 mg/dL — ABNORMAL HIGH (ref 0.61–1.24)
GFR, Estimated: >60 mL/min (ref >60)
Glucose, Bld: 93 mg/dL (ref 70–99)
Potassium: 4.6 mmol/L (ref 3.5–5.1)
Sodium: 143 mmol/L (ref 135–145)

## 2021-11-06 LAB — GLUCOSE, CAPILLARY: Glucose-Capillary: 88 mg/dL (ref 70–99)

## 2021-11-06 LAB — HEMOGLOBIN A1C
Hgb A1c MFr Bld: 4.9 % (ref 4.8–5.6)
Mean Plasma Glucose: 93.93 mg/dL

## 2021-11-06 NOTE — Progress Notes (Signed)
Physical Therapy Treatment Patient Details Name: Gary [name] MRN: [id number] DOB: 02/02/1956 Today's Date: 11/06/2021   History of Present Illness Patient was admitted secondary to SOB, COPD exacerbation, acute metabolic encephalopathy, and acute on chronic hypoxic and hypercapnic respiratory failure in setting of acute decompensated HFpEF and AECOPD requiring intubation and mechanical ventilation. The pt is now s/p tracheostomy and PEG tube placement 11/05/21.    PT Comments    Pt initially declined to participate with PT this session but with encouragement agreed to bed therex only per below.  Pt put forth good effort with exercises but continued to decline mobility.  Pt will benefit from PT services in a SNF setting upon discharge to safely address deficits listed in patient problem list for decreased caregiver assistance and eventual return to PLOF.    Recommendations for follow up therapy are one component of a multi-disciplinary discharge planning process, led by the attending physician.  Recommendations may be updated based on patient status, additional functional criteria and insurance authorization.  Follow Up Recommendations  Skilled nursing-short term rehab (<3 hours/day) Can patient physically be transported by private vehicle: No   Assistance Recommended at Discharge Frequent or constant Supervision/Assistance  Patient can return home with the following Two people to help with walking and/or transfers;Two people to help with bathing/dressing/bathroom;Assistance with cooking/housework;Assist for transportation;Help with stairs or ramp for entrance   Equipment Recommendations  Other (comment) (TBD)    Recommendations for Other Services       Precautions / Restrictions Precautions Precautions: Fall Precaution Comments: IJ, G tube, trach Restrictions Weight Bearing Restrictions: No Other Position/Activity Restrictions: Paring of blood blister on LLE 11/03/21; Per Dr Luana Shu  no weight bearing restrictions on LLE     Mobility  Bed Mobility               General bed mobility comments: Pt declined mobility this date secondary to fatigue, agreed to below supine therex only    Transfers                        Ambulation/Gait                   Stairs             Wheelchair Mobility    Modified Rankin (Stroke Patients Only)       Balance                                            Cognition Arousal/Alertness: Awake/alert Behavior During Therapy: WFL for tasks assessed/performed Overall Cognitive Status: No family/caregiver present to determine baseline cognitive functioning                                          Exercises Total Joint Exercises Ankle Circles/Pumps: Strengthening, Both, 10 reps (with manual resistance) Quad Sets: Strengthening, Both, 10 reps Gluteal Sets: Strengthening, Both, 10 reps Short Arc Quad: Strengthening, Both, 10 reps Heel Slides: Strengthening, AAROM, Both, 10 reps Hip ABduction/ADduction: AAROM, Strengthening, Both, 10 reps Straight Leg Raises: AAROM, Strengthening, Both, 10 reps Other Exercises Other Exercises: Supine leg press to BLE's with manual resistance x 10    General Comments        Pertinent Vitals/Pain Pain Assessment Pain Assessment:  No/denies pain    Home Living                          Prior Function            PT Goals (current goals can now be found in the care plan section) Progress towards PT goals: PT to reassess next treatment    Frequency    Min 2X/week      PT Plan Current plan remains appropriate    Co-evaluation              AM-PAC PT "6 Clicks" Mobility   Outcome Measure  Help needed turning from your back to your side while in a flat bed without using bedrails?: A Lot Help needed moving from lying on your back to sitting on the side of a flat bed without using bedrails?: A  Lot Help needed moving to and from a bed to a chair (including a wheelchair)?: A Lot Help needed standing up from a chair using your arms (e.g., wheelchair or bedside chair)?: A Lot Help needed to walk in hospital room?: Total Help needed climbing 3-5 steps with a railing? : Total 6 Click Score: 10    End of Session Equipment Utilized During Treatment: Oxygen Activity Tolerance: Patient tolerated treatment well Patient left: in bed;with call bell/phone within reach;with nursing/sitter in room Nurse Communication: Mobility status PT Visit Diagnosis: Unsteadiness on feet (R26.81);Muscle weakness (generalized) (M62.81);Difficulty in walking, not elsewhere classified (R26.2)     Time: 9741-6384 PT Time Calculation (min) (ACUTE ONLY): 14 min  Charges:  $Therapeutic Exercise: 8-22 mins                     D. Scott Urijah Raynor PT, DPT 11/06/21, 10:44 AM

## 2021-11-06 NOTE — Progress Notes (Signed)
In PROGRESS NOTE    Gary [name]  XVQ:[id number] DOB: [birth year]  DOA: 09/19/2021 Date of Service: 11/06/21 PCP: [facility], [facility] Course:  66 y/o male with multiple medical problems causing chronic hypercapneic respiratory failure who has had admissions in the past for exacerbations of the same and mechanical ventilation presented for evaluation of dyspnea, weakness. It is unclear if the patient has been compliant with his medications and NIMV at home.  He was admitted by the hospitalists for a presumed COPD exacerbation in the ER, treated with bronchodilators and NIMV but his mental status and hypercarbia worsened despite those interventions so he required intubation.   6/6: Presented to ED.  Required intubation and mechanical ventilation in the ED.  PCCM admit ICU 6/7: Hold diuresis due to worsening Creatinine.  ENT consulted for Trach placement per family request 6/8: Pt continues to spike temps tmax 101.8 degrees F.  Flexiseal placed overnight draining foul smelling watery stool~Cdiff and GI panel pending  6/9: Fever resolved.  Negative for C- diff, GI panel still pending.  Creatinine slightly worsened, requiring low dose levophed (2 mcg). Holding diuresis 6/10: propofol discontinued due hypertriglyceridemia; failed SBT 6/11: Tracheal aspirate from 6/6 resulting with Pseudomonas, ABX changed to Cefepime. neurologically intact, awaiting Trach placement 6/12: Pt calmer today following addition of oral benzo's, narcotics, and Seroquel via tube.  Creatinine worsened with decreased UOP, holding diuresis, low threshold for Nephrology consult  6/13: Pt with worsening renal failure creatinine 4.15.  UOP overnight 25 ml.  Nephrology consulted plans to start pt on hemodialysis follow dialysis catheter placement Pt underwent hemodialysis without ultrafiltration 06/14: .  Plans for HD session today.  Agitation has improved significantly with narcotics and antianxiety medications per  tube   06/16: Tracheostomy canceled 06/15 due to hyperkalemia and tentatively rescheduled for 06/20  6/18 failure to wean from vent 06/20: ENT placed Tracheostomy (#7.0 XLT cuffed).  Developed mild epistaxis post unsuccessful attempt at NGT placement.  Later developed hematochezia/melena, Protonix gtt started and GI consulted. 06/21: Diuresed yesterday with limited results. Patient had softed blood pressures throughout the day and diuresis was held as a result. HD was performed by the patient clotted off the machine and was not able to be rinsed back. Repeat hemoglobin is above 8. 06/23: Pt remains mechanically intubated settings: FiO2 50%/PEEP 10.  CXR concerning for moderate right pleural effusion with associated right basilar atelectasis vs. infiltrate.  Will perform [place] Chest to determine if pt needs right sided thoracentesis  06/24: Remains on the ventilator, FiO2 requirements decreasing, respiratory culture with abundant gram-negative rods: [name], on Meropenem 06/25: Oxygen requirements down to 40%, chest x-ray shows improvement on dense right consolidation, Pseudomonas susceptibilities pending 06/25: Pt remains on the ventilator settings: PEEP 10 and FiO2 40%; will attempt to wean PEEP to 5 to perform SBT  06/28: Pt tolerated PS on 06/27.  Overnight pt had episodes of vomiting TF's suspect secondary to malposition of nasogastric tube.  Will consult IR for PEG tube placement if unable to place PEG tube pt will need postpyloric Dobbhoff   06/29: Pt tolerated PS 5/5 for over an hour yesterday, however due to fatigue placed back on PRVC.  Pt pulled NGT out several times overnight, therefore will not attempt to reinsert.  Plans for PEG tube placement per IR on 06/30.   06/30: PEG tube placed per IR  07/1: Pt tolerated trach collar '@40'$ % throughout the day 07/2: Pt placed on ventilator overnight settings 16/5/35% tolerating well.  Will place pt on trach collar today and place orders for  PT/OT 7/3: Hemodynamically stable.  Tolerating TC (has remained on TC since 7/2 @ 08:00), plan for speech therapy and PT/OT 7/4: Has maintained on trach collar for greater than 48 hours, tolerating Passy-Muir valve.  Plan to transfer out of ICU. SLP recs: dysphagia 1 (puree) w/ nectar liquid.  7/5-7/6: sodium and creatinine trending appropriately, Hgb stable. Low K repleted and stable. Was off vent qhs (orders d/c or missed?) and was restarted. Will need LTAC, TOC following  7/7: pulled out trach overnight. Confusion re: vent plan among staff and myself. I asked PCCM to reevluate him and verify plan so the team and the patient can eb on the same page, and will help TOC arrange appropriate placement.  7/8: I spoke personally w/ Dr Milon Dikes [facility]) and he confirms patient does NOT need ventilator support, unless of course develops distress, but last night the patient tolerated well without it. Plan to meet w/ family today when they visit, I spoke w/ his sister yesterday on the phone. No concerns on CBC/BMP: Cr tending down a bit, Hgb stable, Na slightly high but improving over past few days. He is approaching medically stable and may be appropriate for LTAC vs SNF soon  7/9: Several long discussions with patient and family about medical condition, behavioral issues.  See separate progress notes.  Psychiatry evaluated, agree that he has capacity, started SSRI. 7/10: Patient remains medically stable, awaiting placement.  Discussed his behavioral issues again and encouraged him to always bring any concerns to nursing staff or to me calmly and I am happy to listen, requested he maintain professionalism towards staff, he seems amenable. 7/11-7/14: goal OOB to chair, needing hoyer lift, pt reeducated several times about staying in bed 07/11 and has been compliant through 07/14. Finally got bariatric chair and hoyer lift 7/12 and was OOB 07/13.  D/c monitoring and sitter and transferred to Heath status 07/13. Awaiting  placement.  7/15-17- working to reorient patient, but personality and coherence remain difficult. Stable for placement. Reminded patient not to verbally abuse staff  Consultants:  PCCU Nephrology ENT GI IR Psychiatry  Procedures: 10/03/21: tracheostomy placement 10/12/21: dialysis permacath placement  10/13/21: G-tube placed  Subjective: No events overnight.  Resting in bed comfortable when seen as usual.  No concerns this morning.   ASSESSMENT & PLAN:   Principal Problem:   Acute on chronic respiratory failure with hypoxia and hypercapnia ([name]) Active Problems:   Major depressive disorder, recurrent episode, moderate ([name])   Acute on chronic diastolic congestive heart failure ([name])   COPD with acute exacerbation ([name])   Acute metabolic encephalopathy   HTN (hypertension)   Obesity, Class III, BMI 40-49.9 (morbid obesity) ([place])   Obstructive sleep apnea   Chronic kidney disease, stage 3a ([name])   HLD (hyperlipidemia)   Pressure injury of skin  Left foot blood blister - appreciate podiatry evaluation; underwent removal of blister bedside with podiatry on 7/21 - prevalon boots ordered to aid with pressure wounds going forward  Acute on Chronic Hypoxic Hypercapnic Respiratory Failure Acute exacerbation COPD Pseudomonal Pna OSA/OHS - S/p  tracheostomy by ENT by 6/20 - Trach collar w/ ventilator support as needed / qhs but he does NOT need vent supoort overnight routinely per conversation w/ Dr Milon Dikes 07/08 - Completed 10 day course of ceftazidime for treatment of Pseudomonas - At this point, he is medically stable but placement is going to be an issue for him.  His  behavioral issues have been discussed with him and his behavior has improved    Acute decompensated HFpEF Hypertension - Echocardiogram 08/07/2021: LVEF 60 to 16%, grade 1 diastolic dysfunction, normal RV systolic function - Was on intermittent hemodialysis for volume removal  - has not needed HD and has  remained compensated. Cancelled permacath placement    AKI superimposed CKD stage IIIa  Hypokalemia  Hypernatremia - During admissions in February 2023, creatinine 1.8 with GFR 40; in May 2023 creatinine 1.1 with GFR of 60 - Monitor I&O's / urinary output Follow BMP  - Cr down, good UOP noted, d/c dialysis catheter, monitor renal parameters daily. -cancelled permacath placement    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 - resolved  Pseudomonas Pneumonia  - Completed 10 day course of ceftazidime (started on 06/26) - C. Diff negative   Acute Blood Loss Anemia - resolved  Nasopharyngeal trauma vs Upper GI bleed - Continue BID IV PPI - per GI, no need for scope    Hyperglycemia - CBG's achs; patient refusing at times - SSI  Acute Metabolic Encephalopathy in setting of CO2 Narcosis Sedation needs in setting of mechanical ventilation - CT Head 09/19/21: negative;  UDS negative - Treatment of hypercapnia as outlined above  Trach in place Gtube in place - Was Requiring ventilatory support at night, has done well without this - Working on increasing po intake, SLP and dietary following  - Has not required ventilatory support, doing well on O2 via the trach - Continues to eat well; G-tube will have to remain in place at least 3 months prior to removal   DVT prophylaxis:lovenox Code Status: FULL Disposition Plan / TOC needs: to med-surg 07/13, awaiting placement  Barriers to discharge / significant pending items: remains on trach; behavioral issues  Objective: Vitals:   11/06/21 0500 11/06/21 0538 11/06/21 0801 11/06/21 0824  BP:  115/75  123/76  Pulse:  90  82  Resp:  18  17  Temp:  97.9 F (36.6 C)  97.8 F (36.6 C)  TempSrc:      SpO2:  90% 90% 94%  Weight: (!) 190.5 kg     Height:        Intake/Output Summary (Last 24 hours) at 11/06/2021 1359 Last data filed at 11/06/2021 0542 Gross per 24 hour  Intake 480 ml  Output 350 ml  Net 130 ml    Filed Weights    11/04/21 0500 11/05/21 0443 11/06/21 0500  Weight: (!) 191.4 kg (!) 190.5 kg (!) 190.5 kg   Physical Exam: Physical Exam Constitutional:      General: He is not in acute distress.    Appearance: Normal appearance. He is obese. He is not ill-appearing.  HENT:     Head: Normocephalic and atraumatic.     Mouth/Throat:     Mouth: Mucous membranes are moist.  Eyes:     Extraocular Movements: Extraocular movements intact.  Neck:     Comments: Trach in place Pulmonary:     Effort: No respiratory distress.     Breath sounds: Normal breath sounds.  Abdominal:     General: Bowel sounds are normal. There is no distension.     Palpations: Abdomen is soft.  Musculoskeletal:        General: Normal range of motion.     Cervical back: Normal range of motion.  Skin:    General: Skin is warm.  Neurological:     General: No focal deficit present.     Mental Status: He  is alert.  Psychiatric:        Mood and Affect: Mood normal.     Scheduled Medications:   atorvastatin  10 mg Per Tube Daily   budesonide (PULMICORT) nebulizer solution  0.5 mg Nebulization BID   Chlorhexidine Gluconate Cloth  6 each Topical Daily   cholecalciferol  2,000 Units Per Tube Daily   enoxaparin (LOVENOX) injection  0.5 mg/kg Subcutaneous Q24H   escitalopram  5 mg Oral Daily   feeding supplement (PROSource TF)  90 mL Per Tube TID   free water  30 mL Per Tube Q4H   nutrition supplement (JUVEN)  1 packet Per Tube BID BM   mouth rinse  15 mL Mouth Rinse 4 times per day   pantoprazole  40 mg Oral BID   traZODone  50 mg Oral QHS    Continuous Infusions:  sodium chloride Stopped (10/07/21 0542)    PRN Medications:  acetaminophen, acetaminophen, albuterol, benzonatate, bisacodyl, clonazepam, guaiFENesin, hydrOXYzine, loperamide, Muscle Rub, nitroGLYCERIN, mouth rinse, oxyCODONE, pentafluoroprop-tetrafluoroeth, polyethylene glycol, QUEtiapine  Antimicrobials:  Anti-infectives (From admission, onward)    Start      Dose/Rate Route Frequency Ordered Stop   10/12/21 1200  cefTAZidime (FORTAZ) 2 g in sodium chloride 0.9 % 100 mL IVPB        2 g 200 mL/hr over 30 Minutes Intravenous 2 times daily 10/12/21 1122 10/17/21 0844   10/12/21 0417  ceFAZolin (ANCEF) IVPB 1 g/50 mL premix  Status:  Discontinued        1 g 100 mL/hr over 30 Minutes Intravenous 30 min pre-op 10/12/21 0417 10/16/21 0751   10/09/21 2000  ceFEPIme (MAXIPIME) 1 g in sodium chloride 0.9 % 100 mL IVPB  Status:  Discontinued        1 g 200 mL/hr over 30 Minutes Intravenous Every 24 hours 10/09/21 1404 10/09/21 1515   10/09/21 2000  cefTAZidime (FORTAZ) 1 g in sodium chloride 0.9 % 100 mL IVPB  Status:  Discontinued        1 g 200 mL/hr over 30 Minutes Intravenous Every 24 hours 10/09/21 1515 10/12/21 1122   10/06/21 1830  meropenem (MERREM) 500 mg in sodium chloride 0.9 % 100 mL IVPB  Status:  Discontinued        500 mg 200 mL/hr over 30 Minutes Intravenous Every 24 hours 10/06/21 1735 10/09/21 1403   10/06/21 1800  doxycycline (VIBRAMYCIN) 100 mg in sodium chloride 0.9 % 250 mL IVPB  Status:  Discontinued        100 mg 125 mL/hr over 120 Minutes Intravenous Every 12 hours 10/06/21 1657 10/08/21 0838   09/29/21 1000  ceFEPIme (MAXIPIME) 2 g in sodium chloride 0.9 % 100 mL IVPB        2 g 200 mL/hr over 30 Minutes Intravenous Every 12 hours 09/29/21 0812 09/30/21 2342   09/26/21 2200  ceFEPIme (MAXIPIME) 1 g in sodium chloride 0.9 % 100 mL IVPB  Status:  Discontinued        1 g 200 mL/hr over 30 Minutes Intravenous Every 24 hours 09/26/21 1154 09/29/21 0812   09/26/21 2000  ceFEPIme (MAXIPIME) 2 g in sodium chloride 0.9 % 100 mL IVPB  Status:  Discontinued        2 g 200 mL/hr over 30 Minutes Intravenous Every 12 hours 09/26/21 0809 09/26/21 1154   09/24/21 1200  ceFEPIme (MAXIPIME) 2 g in sodium chloride 0.9 % 100 mL IVPB  Status:  Discontinued  2 g 200 mL/hr over 30 Minutes Intravenous Every 8 hours 09/24/21 0950 09/26/21  0809   09/21/21 1430  azithromycin (ZITHROMAX) 500 mg in sodium chloride 0.9 % 250 mL IVPB        500 mg 250 mL/hr over 60 Minutes Intravenous Every 24 hours 09/21/21 1344 09/23/21 1543   09/21/21 1430  cefTRIAXone (ROCEPHIN) 2 g in sodium chloride 0.9 % 100 mL IVPB  Status:  Discontinued        2 g 200 mL/hr over 30 Minutes Intravenous Every 24 hours 09/21/21 1344 09/24/21 0922       Data Reviewed: I have personally reviewed following labs and imaging studies  CBC: Recent Labs  Lab 10/31/21 0519 11/02/21 0530 11/03/21 0503  WBC 7.5 7.2 5.2  NEUTROABS  --  4.2 3.1  HGB 9.2* 9.3* 9.9*  HCT 34.4* 35.1* 37.6*  MCV 85.4 85.6 85.6  PLT 136* 183 097    Basic Metabolic Panel: Recent Labs  Lab 11/02/21 0530 11/03/21 0503 11/06/21 0528  NA 141 143 143  K 4.0 4.5 4.6  CL 106 109 104  CO2 31 32 33*  GLUCOSE 84 86 93  BUN '20 17 22  '$ CREATININE 1.01 1.06 1.30*  CALCIUM 8.4* 8.6* 8.6*  MG 1.5* 2.0  --     GFR: Estimated Creatinine Clearance: 103 mL/min (A) (by C-G formula based on SCr of 1.3 mg/dL (H)). Liver Function Tests: No results for input(s): "AST", "ALT", "ALKPHOS", "BILITOT", "PROT", "ALBUMIN" in the last 168 hours.  No results for input(s): "LIPASE", "AMYLASE" in the last 168 hours. No results for input(s): "AMMONIA" in the last 168 hours. Coagulation Profile: No results for input(s): "INR", "PROTIME" in the last 168 hours. Cardiac Enzymes: No results for input(s): "CKTOTAL", "CKMB", "CKMBINDEX", "TROPONINI" in the last 168 hours. BNP (last 3 results) No results for input(s): "PROBNP" in the last 8760 hours. HbA1C: No results for input(s): "HGBA1C" in the last 72 hours. CBG: Recent Labs  Lab 11/01/21 1629 11/01/21 2024 11/02/21 0147 11/02/21 0610 11/03/21 1950  GLUCAP 99 115* 107* 81 112*    Lipid Profile: No results for input(s): "CHOL", "HDL", "LDLCALC", "TRIG", "CHOLHDL", "LDLDIRECT" in the last 72 hours. Thyroid Function Tests: No results for  input(s): "TSH", "T4TOTAL", "FREET4", "T3FREE", "THYROIDAB" in the last 72 hours. Anemia Panel: No results for input(s): "VITAMINB12", "FOLATE", "FERRITIN", "TIBC", "IRON", "RETICCTPCT" in the last 72 hours. Urine analysis:    Component Value Date/Time   COLORURINE STRAW (A) 09/19/2021 1247   APPEARANCEUR CLEAR (A) 09/19/2021 1247   APPEARANCEUR Clear 04/15/2014 1240   LABSPEC 1.009 09/19/2021 1247   LABSPEC 1.013 04/15/2014 1240   PHURINE 5.0 09/19/2021 1247   GLUCOSEU 150 (A) 09/19/2021 1247   GLUCOSEU Negative 04/15/2014 1240   HGBUR NEGATIVE 09/19/2021 1247   BILIRUBINUR NEGATIVE 09/19/2021 1247   BILIRUBINUR Negative 04/15/2014 1240   KETONESUR NEGATIVE 09/19/2021 1247   PROTEINUR NEGATIVE 09/19/2021 1247   NITRITE NEGATIVE 09/19/2021 1247   LEUKOCYTESUR NEGATIVE 09/19/2021 1247   LEUKOCYTESUR Negative 04/15/2014 1240   Sepsis Labs: '@LABRCNTIP'$ (procalcitonin:4,lacticidven:4)  No results found for this or any previous visit (from the past 240 hour(s)).      Radiology Studies last 96 hours: No results found.     LOS: 8 days     Dwyane Dee, MD [facility] 11/06/2021, 2:00 PM    If 7PM-7AM, please contact night-coverage [email or web address]

## 2021-11-06 NOTE — Progress Notes (Signed)
Patient has refused Q4H CBGs for the entire shift; RN and CN aware

## 2021-11-07 DIAGNOSIS — J9622 Acute and chronic respiratory failure with hypercapnia: Secondary | ICD-10-CM | POA: Diagnosis not present

## 2021-11-07 DIAGNOSIS — J9621 Acute and chronic respiratory failure with hypoxia: Secondary | ICD-10-CM | POA: Diagnosis not present

## 2021-11-07 MED ORDER — PROSOURCE PLUS PO LIQD
30.0000 mL | Freq: Three times a day (TID) | ORAL | Status: DC
  Administered 2021-11-10 – 2021-11-25 (×15): 30 mL via ORAL

## 2021-11-07 MED ORDER — ATORVASTATIN CALCIUM 10 MG PO TABS
10.0000 mg | ORAL_TABLET | Freq: Every day | ORAL | Status: DC
  Administered 2021-11-07 – 2021-12-13 (×35): 10 mg via ORAL
  Filled 2021-11-07 (×35): qty 1

## 2021-11-07 MED ORDER — JUVEN PO PACK
1.0000 | PACK | Freq: Two times a day (BID) | ORAL | Status: DC
  Administered 2021-11-07 – 2021-11-12 (×4): 1 via ORAL

## 2021-11-07 MED ORDER — VITAMIN D 25 MCG (1000 UNIT) PO TABS
2000.0000 [IU] | ORAL_TABLET | Freq: Every day | ORAL | Status: DC
  Administered 2021-11-07 – 2021-12-13 (×35): 2000 [IU] via ORAL
  Filled 2021-11-07 (×37): qty 2

## 2021-11-07 MED ORDER — POLYETHYLENE GLYCOL 3350 17 G PO PACK
17.0000 g | PACK | Freq: Every day | ORAL | Status: DC | PRN
  Administered 2021-11-08 – 2021-12-05 (×2): 17 g via ORAL
  Filled 2021-11-07 (×2): qty 1

## 2021-11-07 MED ORDER — OXYCODONE HCL 5 MG PO TABS
10.0000 mg | ORAL_TABLET | Freq: Three times a day (TID) | ORAL | Status: DC | PRN
  Administered 2021-11-07 – 2021-11-18 (×22): 10 mg via ORAL
  Filled 2021-11-07 (×22): qty 2

## 2021-11-07 NOTE — Progress Notes (Signed)
OT Cancellation Note  Patient Details Name: Gary [name] MRN: [id number] DOB: [birth year]   Cancelled Treatment:    Reason Eval/Treat Not Completed: Patient declined, no reason specified. Chart reviewed, upon arrival pt reclined in bed. Pt declines to do bed level exercises or attempt mobility this date stating "I'll wait for those guys." When told PT will be seeing pt tomorrow pt states "then I'll wait until tmrw." Pt informed of 3 refusals leading to OT cancellation and states "you dont need to come back". Will attempt one more time prior to signing off.   [name], M.S. OTR/L  11/07/21, 3:28 PM  ascom [phone]

## 2021-11-07 NOTE — Progress Notes (Signed)
In PROGRESS NOTE    Gary [name]  SHF:[id number] DOB: 1956/02/10  DOA: 09/19/2021 Date of Service: 11/07/21 PCP: [facility] Course:  66 y/o male with multiple medical problems causing chronic hypercapneic respiratory failure who has had admissions in the past for exacerbations of the same and mechanical ventilation presented for evaluation of dyspnea, weakness. It is unclear if the patient has been compliant with his medications and NIMV at home.  He was admitted by the hospitalists for a presumed COPD exacerbation in the ER, treated with bronchodilators and NIMV but his mental status and hypercarbia worsened despite those interventions so he required intubation.   6/6: Presented to ED.  Required intubation and mechanical ventilation in the ED.  PCCM admit ICU 6/7: Hold diuresis due to worsening Creatinine.  ENT consulted for Trach placement per family request 6/8: Pt continues to spike temps tmax 101.8 degrees F.  Flexiseal placed overnight draining foul smelling watery stool~Cdiff and GI panel pending  6/9: Fever resolved.  Negative for C- diff, GI panel still pending.  Creatinine slightly worsened, requiring low dose levophed (2 mcg). Holding diuresis 6/10: propofol discontinued due hypertriglyceridemia; failed SBT 6/11: Tracheal aspirate from 6/6 resulting with Pseudomonas, ABX changed to Cefepime. neurologically intact, awaiting Trach placement 6/12: Pt calmer today following addition of oral benzo's, narcotics, and Seroquel via tube.  Creatinine worsened with decreased UOP, holding diuresis, low threshold for Nephrology consult  6/13: Pt with worsening renal failure creatinine 4.15.  UOP overnight 25 ml.  Nephrology consulted plans to start pt on hemodialysis follow dialysis catheter placement Pt underwent hemodialysis without ultrafiltration 06/14: .  Plans for HD session today.  Agitation has improved significantly with narcotics and antianxiety medications per  tube   06/16: Tracheostomy canceled 06/15 due to hyperkalemia and tentatively rescheduled for 06/20  6/18 failure to wean from vent 06/20: ENT placed Tracheostomy (#7.0 XLT cuffed).  Developed mild epistaxis post unsuccessful attempt at NGT placement.  Later developed hematochezia/melena, Protonix gtt started and GI consulted. 06/21: Diuresed yesterday with limited results. Patient had softed blood pressures throughout the day and diuresis was held as a result. HD was performed by the patient clotted off the machine and was not able to be rinsed back. Repeat hemoglobin is above 8. 06/23: Pt remains mechanically intubated settings: FiO2 50%/PEEP 10.  CXR concerning for moderate right pleural effusion with associated right basilar atelectasis vs. infiltrate.  Will perform [place] Chest to determine if pt needs right sided thoracentesis  06/24: Remains on the ventilator, FiO2 requirements decreasing, respiratory culture with abundant gram-negative rods: [name], on Meropenem 06/25: Oxygen requirements down to 40%, chest x-ray shows improvement on dense right consolidation, Pseudomonas susceptibilities pending 06/25: Pt remains on the ventilator settings: PEEP 10 and FiO2 40%; will attempt to wean PEEP to 5 to perform SBT  06/28: Pt tolerated PS on 06/27.  Overnight pt had episodes of vomiting TF's suspect secondary to malposition of nasogastric tube.  Will consult IR for PEG tube placement if unable to place PEG tube pt will need postpyloric Dobbhoff   06/29: Pt tolerated PS 5/5 for over an hour yesterday, however due to fatigue placed back on PRVC.  Pt pulled NGT out several times overnight, therefore will not attempt to reinsert.  Plans for PEG tube placement per IR on 06/30.   06/30: PEG tube placed per IR  07/1: Pt tolerated trach collar '@40'$ % throughout the day 07/2: Pt placed on ventilator overnight settings 16/5/35% tolerating well.  Will place pt on trach collar today and place orders for  PT/OT 7/3: Hemodynamically stable.  Tolerating TC (has remained on TC since 7/2 @ 08:00), plan for speech therapy and PT/OT 7/4: Has maintained on trach collar for greater than 48 hours, tolerating Passy-Muir valve.  Plan to transfer out of ICU. SLP recs: dysphagia 1 (puree) w/ nectar liquid.  7/5-7/6: sodium and creatinine trending appropriately, Hgb stable. Low K repleted and stable. Was off vent qhs (orders d/c or missed?) and was restarted. Will need LTAC, TOC following  7/7: pulled out trach overnight. Confusion re: vent plan among staff and myself. I asked PCCM to reevluate him and verify plan so the team and the patient can eb on the same page, and will help TOC arrange appropriate placement.  7/8: I spoke personally w/ Dr Milon Dikes [facility]) and he confirms patient does NOT need ventilator support, unless of course develops distress, but last night the patient tolerated well without it. Plan to meet w/ family today when they visit, I spoke w/ his sister yesterday on the phone. No concerns on CBC/BMP: Cr tending down a bit, Hgb stable, Na slightly high but improving over past few days. He is approaching medically stable and may be appropriate for LTAC vs SNF soon  7/9: Several long discussions with patient and family about medical condition, behavioral issues.  See separate progress notes.  Psychiatry evaluated, agree that he has capacity, started SSRI. 7/10: Patient remains medically stable, awaiting placement.  Discussed his behavioral issues again and encouraged him to always bring any concerns to nursing staff or to me calmly and I am happy to listen, requested he maintain professionalism towards staff, he seems amenable. 7/11-7/14: goal OOB to chair, needing hoyer lift, pt reeducated several times about staying in bed 07/11 and has been compliant through 07/14. Finally got bariatric chair and hoyer lift 7/12 and was OOB 07/13.  D/c monitoring and sitter and transferred to Dana status 07/13. Awaiting  placement.  7/15-17- working to reorient patient, but personality and coherence remain difficult. Stable for placement. Reminded patient not to verbally abuse staff  Consultants:  PCCU Nephrology ENT GI IR Psychiatry  Procedures: 10/03/21: tracheostomy placement 10/12/21: dialysis permacath placement  10/13/21: G-tube placed  Subjective: No events overnight.  Resting in bed comfortable when seen as usual.  No concerns this morning.   ASSESSMENT & PLAN:   Principal Problem:   Acute on chronic respiratory failure with hypoxia and hypercapnia ([name]) Active Problems:   Major depressive disorder, recurrent episode, moderate ([name])   Acute on chronic diastolic congestive heart failure ([name])   COPD with acute exacerbation ([name])   Acute metabolic encephalopathy   HTN (hypertension)   Obesity, Class III, BMI 40-49.9 (morbid obesity) ([place])   Obstructive sleep apnea   Chronic kidney disease, stage 3a ([name])   HLD (hyperlipidemia)   Pressure injury of skin  Left foot blood blister - appreciate podiatry evaluation; underwent removal of blister bedside with podiatry on 7/21 - prevalon boots ordered to aid with pressure wounds going forward  Acute on Chronic Hypoxic Hypercapnic Respiratory Failure Acute exacerbation COPD Pseudomonal Pna OSA/OHS - S/p  tracheostomy by ENT by 6/20 - Trach collar w/ ventilator support as needed / qhs but he does NOT need vent supoort overnight routinely per conversation w/ Dr Milon Dikes 07/08 - Completed 10 day course of ceftazidime for treatment of Pseudomonas - At this point, he is medically stable but placement is going to be an issue for him.  His  behavioral issues have been discussed with him and his behavior has improved    Acute decompensated HFpEF Hypertension - Echocardiogram 08/07/2021: LVEF 60 to 19%, grade 1 diastolic dysfunction, normal RV systolic function - Was on intermittent hemodialysis for volume removal  - has not needed HD and has  remained compensated. Cancelled permacath placement    AKI superimposed CKD stage IIIa  Hypokalemia  Hypernatremia - During admissions in February 2023, creatinine 1.8 with GFR 40; in May 2023 creatinine 1.1 with GFR of 60 - Monitor I&O's / urinary output Follow BMP  - Cr down, good UOP noted, d/c dialysis catheter, monitor renal parameters daily. -cancelled permacath placement    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 - resolved  Pseudomonas Pneumonia  - Completed 10 day course of ceftazidime (started on 06/26) - C. Diff negative   Acute Blood Loss Anemia - resolved  Nasopharyngeal trauma vs Upper GI bleed - Continue BID IV PPI - per GI, no need for scope    Hyperglycemia - CBG's achs; patient refusing at times - SSI  Acute Metabolic Encephalopathy in setting of CO2 Narcosis Sedation needs in setting of mechanical ventilation - CT Head 09/19/21: negative;  UDS negative - Treatment of hypercapnia as outlined above  Trach in place Gtube in place - Was Requiring ventilatory support at night, has done well without this - Working on increasing po intake, SLP and dietary following  - Has not required ventilatory support, doing well on O2 via the trach - Continues to eat well; G-tube will have to remain in place at least 3 months prior to removal   DVT prophylaxis:lovenox Code Status: FULL Disposition Plan / TOC needs: to med-surg 07/13, awaiting placement  Barriers to discharge / significant pending items: remains on trach; behavioral issues  Objective: Vitals:   11/07/21 0621 11/07/21 0640 11/07/21 0744 11/07/21 0836  BP: 105/72   118/76  Pulse: 87   85  Resp:    18  Temp: 98.4 F (36.9 C)   98.3 F (36.8 C)  TempSrc: Oral     SpO2: (!) 86% 93% 91% 91%  Weight:      Height:        Intake/Output Summary (Last 24 hours) at 11/07/2021 1433 Last data filed at 11/07/2021 1300 Gross per 24 hour  Intake 750 ml  Output 701 ml  Net 49 ml    Filed Weights    11/04/21 0500 11/05/21 0443 11/06/21 0500  Weight: (!) 191.4 kg (!) 190.5 kg (!) 190.5 kg   Physical Exam: General exam: awake, alert, no acute distress, morbidly obese HEENT: trach with PMV in place, trach collar, moist mucus membranes, hearing grossly normal  Respiratory system: CTAB, no wheezes, rales or rhonchi, normal respiratory effort. Cardiovascular system: normal S1/S2, RRR, no pedal edema.   Gastrointestinal system: PEG present, soft non-tender, +bowel sounds. Central nervous system: no gross focal neurologic deficits, normal speech Extremities: moves all, no edema, normal tone Skin: dry, intact, normal temperature Psychiatry: normal mood, congruent affect, judgement and insight appear normal     Data Reviewed: I have personally reviewed following labs and imaging studies  CBC: Recent Labs  Lab 11/02/21 0530 11/03/21 0503  WBC 7.2 5.2  NEUTROABS 4.2 3.1  HGB 9.3* 9.9*  HCT 35.1* 37.6*  MCV 85.6 85.6  PLT 183 417    Basic Metabolic Panel: Recent Labs  Lab 11/02/21 0530 11/03/21 0503 11/06/21 0528  NA 141 143 143  K 4.0 4.5 4.6  CL 106 109 104  CO2 31 32 33*  GLUCOSE 84 86 93  BUN '20 17 22  '$ CREATININE 1.01 1.06 1.30*  CALCIUM 8.4* 8.6* 8.6*  MG 1.5* 2.0  --     GFR: Estimated Creatinine Clearance: 103 mL/min (A) (by C-G formula based on SCr of 1.3 mg/dL (H)). Liver Function Tests: No results for input(s): "AST", "ALT", "ALKPHOS", "BILITOT", "PROT", "ALBUMIN" in the last 168 hours.  No results for input(s): "LIPASE", "AMYLASE" in the last 168 hours. No results for input(s): "AMMONIA" in the last 168 hours. Coagulation Profile: No results for input(s): "INR", "PROTIME" in the last 168 hours. Cardiac Enzymes: No results for input(s): "CKTOTAL", "CKMB", "CKMBINDEX", "TROPONINI" in the last 168 hours. BNP (last 3 results) No results for input(s): "PROBNP" in the last 8760 hours. HbA1C: Recent Labs    11/06/21 0529  HGBA1C 4.9   CBG: Recent Labs   Lab 11/01/21 2024 11/02/21 0147 11/02/21 0610 11/03/21 1950 11/06/21 2101  GLUCAP 115* 107* 81 112* 88    Lipid Profile: No results for input(s): "CHOL", "HDL", "LDLCALC", "TRIG", "CHOLHDL", "LDLDIRECT" in the last 72 hours. Thyroid Function Tests: No results for input(s): "TSH", "T4TOTAL", "FREET4", "T3FREE", "THYROIDAB" in the last 72 hours. Anemia Panel: No results for input(s): "VITAMINB12", "FOLATE", "FERRITIN", "TIBC", "IRON", "RETICCTPCT" in the last 72 hours. Urine analysis:    Component Value Date/Time   COLORURINE STRAW (A) 09/19/2021 1247   APPEARANCEUR CLEAR (A) 09/19/2021 1247   APPEARANCEUR Clear 04/15/2014 1240   LABSPEC 1.009 09/19/2021 1247   LABSPEC 1.013 04/15/2014 1240   PHURINE 5.0 09/19/2021 1247   GLUCOSEU 150 (A) 09/19/2021 1247   GLUCOSEU Negative 04/15/2014 1240   HGBUR NEGATIVE 09/19/2021 1247   BILIRUBINUR NEGATIVE 09/19/2021 1247   BILIRUBINUR Negative 04/15/2014 1240   KETONESUR NEGATIVE 09/19/2021 1247   PROTEINUR NEGATIVE 09/19/2021 1247   NITRITE NEGATIVE 09/19/2021 1247   LEUKOCYTESUR NEGATIVE 09/19/2021 1247   LEUKOCYTESUR Negative 04/15/2014 1240   Sepsis Labs: '@LABRCNTIP'$ (procalcitonin:4,lacticidven:4)  No results found for this or any previous visit (from the past 240 hour(s)).      Radiology Studies last 96 hours: No results found.     LOS: 60 days     Ezekiel Slocumb, DO [facility] 11/07/2021, 2:33 PM    If 7PM-7AM, please contact night-coverage [email or web address]

## 2021-11-07 NOTE — Progress Notes (Signed)
Nutrition Follow-up  DOCUMENTATION CODES:   Morbid obesity  INTERVENTION:   -D/c Prosource TF -30 ml Prosource Plus QID, each supplement provides 100 kcals and 15 grams protein -1 packet Juven BID, each packet provides 95 calories, 2.5 grams of protein (collagen), and 9.8 grams of carbohydrate (3 grams sugar); also contains 7 grams of L-arginine and L-glutamine, 300 mg vitamin C, 15 mg vitamin E, 1.2 mcg vitamin B-12, 9.5 mg zinc, 200 mg calcium, and 1.5 g  Calcium Beta-hydroxy-Beta-methylbutyrate to support wound healing  -Continue double protein portions with meals -Continue Cholecalciferol 2000 units daily   NUTRITION DIAGNOSIS:   Inadequate oral intake related to inability to eat (pt sedated and ventilated) as evidenced by NPO status.  Progressing   GOAL:   Patient will meet greater than or equal to 90% of their needs  Progressing   MONITOR:   PO intake, Supplement acceptance, Labs, Weight trends, TF tolerance, Skin, I & O's  REASON FOR ASSESSMENT:   Ventilator    ASSESSMENT:   66 y/o male with h/o PUD, OSA, HTN, CKD III, substance abuse, CHF, Hep C and COPD and COVID 19 (02/2021) who is admitted with COPD exacerbation, CHF and AKI.  -Pt s/p tracheostomy 6/20 -Pt s/p 31F IR G-tube 6/30  Reviewed MAR; pt refusing Prosource, Juven, and meds via tube.   Case discussed with RN, who reports that pt is requesting not to use the feeding tube and is taking his medications PO. RN is requesting to transition supplements to PO route. Per RN report, pt took all meds and Juven PO today and also consumed 100% of his breakfast.    Pt continues with good appetite, consuming 100% of meals.   Medications reviewed and include vitamin D3.   Labs reviewed: CBGS: 88-112 (inpatient orders for glycemic control are none).    Diet Order:   Diet Order             Diet 2 gram sodium Room service appropriate? Yes with Assist; Fluid consistency: Thin  Diet effective now                    EDUCATION NEEDS:   No education needs have been identified at this time  Skin:  Skin Assessment: Skin Integrity Issues: Skin Integrity Issues:: Incisions, Stage II, Other (Comment) Stage II: medial perineum Incisions: closed neck s/p trach Other: MASD to rt groin  Last BM:  11/05/21  Height:   Ht Readings from Last 1 Encounters:  11/03/21 '6\' 3"'$  (1.905 m)    Weight:   Wt Readings from Last 1 Encounters:  11/06/21 (!) 190.5 kg    Ideal Body Weight:  89 kg  BMI:  Body mass index is 52.5 kg/m.  Estimated Nutritional Needs:   Kcal:  0277-4128  Protein:  175-190 grams  Fluid:  > 2 L    Loistine Chance, RD, LDN, [place] Registered Dietitian II Certified Diabetes Care and Education Specialist Please refer to [facility] for RD and/or RD on-call/weekend/after hours pager

## 2021-11-08 DIAGNOSIS — J9621 Acute and chronic respiratory failure with hypoxia: Secondary | ICD-10-CM | POA: Diagnosis not present

## 2021-11-08 DIAGNOSIS — J9622 Acute and chronic respiratory failure with hypercapnia: Secondary | ICD-10-CM | POA: Diagnosis not present

## 2021-11-08 NOTE — Progress Notes (Signed)
Rec call from floor stating that pt was c/o sob. Upon arrival, pt needed suctioning. I then suctioned trach for lg amt thick tan secretions. Pt then stated that the sob was resolved & he didn't need prn svn tx.

## 2021-11-08 NOTE — Progress Notes (Signed)
PT Cancellation Note  Patient Details Name: Gary [name] MRN: [id number] DOB: 1955-09-05   Cancelled Treatment:    Reason Eval/Treat Not Completed: Patient declined, no reason specified. Second attempt made by PT per pt request. Second pt refusal. Pt asking to eat lunch as he has not eaten yet. Pt assisted via bed features sitting upright in bed. Will re-attempt later time/date as able. RN encouraged to utilize hoyer lift to assist pt in recliner.    Salem Caster. Fairly IV, PT, DPT Physical Therapist- [facility]  11/08/2021, 3:05 PM

## 2021-11-08 NOTE — Plan of Care (Signed)
  Problem: Cardiac: Goal: Ability to achieve and maintain adequate cardiopulmonary perfusion will improve Outcome: Progressing   Problem: Respiratory: Goal: Levels of oxygenation will improve Outcome: Progressing   Problem: Nutrition: Goal: Adequate nutrition will be maintained Outcome: Progressing

## 2021-11-08 NOTE — Progress Notes (Signed)
In PROGRESS NOTE    Gary [name]  STM:[id number] DOB: 1955-11-28  DOA: 09/19/2021 Date of Service: 11/08/21 PCP: Center, [facility] Course:  66 y/o male with multiple medical problems causing chronic hypercapneic respiratory failure who has had admissions in the past for exacerbations of the same and mechanical ventilation presented for evaluation of dyspnea, weakness. It is unclear if the patient has been compliant with his medications and NIMV at home.  He was admitted by the hospitalists for a presumed COPD exacerbation in the ER, treated with bronchodilators and NIMV but his mental status and hypercarbia worsened despite those interventions so he required intubation.   6/6: Presented to ED.  Required intubation and mechanical ventilation in the ED.  PCCM admit ICU 6/7: Hold diuresis due to worsening Creatinine.  ENT consulted for Trach placement per family request 6/8: Pt continues to spike temps tmax 101.8 degrees F.  Flexiseal placed overnight draining foul smelling watery stool~Cdiff and GI panel pending  6/9: Fever resolved.  Negative for C- diff, GI panel still pending.  Creatinine slightly worsened, requiring low dose levophed (2 mcg). Holding diuresis 6/10: propofol discontinued due hypertriglyceridemia; failed SBT 6/11: Tracheal aspirate from 6/6 resulting with Pseudomonas, ABX changed to Cefepime. neurologically intact, awaiting Trach placement 6/12: Pt calmer today following addition of oral benzo's, narcotics, and Seroquel via tube.  Creatinine worsened with decreased UOP, holding diuresis, low threshold for Nephrology consult  6/13: Pt with worsening renal failure creatinine 4.15.  UOP overnight 25 ml.  Nephrology consulted plans to start pt on hemodialysis follow dialysis catheter placement Pt underwent hemodialysis without ultrafiltration 06/14: .  Plans for HD session today.  Agitation has improved significantly with narcotics and antianxiety medications per  tube   06/16: Tracheostomy canceled 06/15 due to hyperkalemia and tentatively rescheduled for 06/20  6/18 failure to wean from vent 06/20: ENT placed Tracheostomy (#7.0 XLT cuffed).  Developed mild epistaxis post unsuccessful attempt at NGT placement.  Later developed hematochezia/melena, Protonix gtt started and GI consulted. 06/21: Diuresed yesterday with limited results. Patient had softed blood pressures throughout the day and diuresis was held as a result. HD was performed by the patient clotted off the machine and was not able to be rinsed back. Repeat hemoglobin is above 8. 06/23: Pt remains mechanically intubated settings: FiO2 50%/PEEP 10.  CXR concerning for moderate right pleural effusion with associated right basilar atelectasis vs. infiltrate.  Will perform [place] Chest to determine if pt needs right sided thoracentesis  06/24: Remains on the ventilator, FiO2 requirements decreasing, respiratory culture with abundant gram-negative rods: [name], on Meropenem 06/25: Oxygen requirements down to 40%, chest x-ray shows improvement on dense right consolidation, Pseudomonas susceptibilities pending 06/25: Pt remains on the ventilator settings: PEEP 10 and FiO2 40%; will attempt to wean PEEP to 5 to perform SBT  06/28: Pt tolerated PS on 06/27.  Overnight pt had episodes of vomiting TF's suspect secondary to malposition of nasogastric tube.  Will consult IR for PEG tube placement if unable to place PEG tube pt will need postpyloric Dobbhoff   06/29: Pt tolerated PS 5/5 for over an hour yesterday, however due to fatigue placed back on PRVC.  Pt pulled NGT out several times overnight, therefore will not attempt to reinsert.  Plans for PEG tube placement per IR on 06/30.   06/30: PEG tube placed per IR  07/1: Pt tolerated trach collar '@40'$ % throughout the day 07/2: Pt placed on ventilator overnight settings 16/5/35% tolerating well.  Will place pt on trach collar today and place orders for  PT/OT 7/3: Hemodynamically stable.  Tolerating TC (has remained on TC since 7/2 @ 08:00), plan for speech therapy and PT/OT 7/4: Has maintained on trach collar for greater than 48 hours, tolerating Passy-Muir valve.  Plan to transfer out of ICU. SLP recs: dysphagia 1 (puree) w/ nectar liquid.  7/5-7/6: sodium and creatinine trending appropriately, Hgb stable. Low K repleted and stable. Was off vent qhs (orders d/c or missed?) and was restarted. Will need LTAC, TOC following  7/7: pulled out trach overnight. Confusion re: vent plan among staff and myself. I asked PCCM to reevluate him and verify plan so the team and the patient can eb on the same page, and will help TOC arrange appropriate placement.  7/8: I spoke personally w/ Dr Milon Dikes [facility]) and he confirms patient does NOT need ventilator support, unless of course develops distress, but last night the patient tolerated well without it. Plan to meet w/ family today when they visit, I spoke w/ his sister yesterday on the phone. No concerns on CBC/BMP: Cr tending down a bit, Hgb stable, Na slightly high but improving over past few days. He is approaching medically stable and may be appropriate for LTAC vs SNF soon  7/9: Several long discussions with patient and family about medical condition, behavioral issues.  See separate progress notes.  Psychiatry evaluated, agree that he has capacity, started SSRI. 7/10: Patient remains medically stable, awaiting placement.  Discussed his behavioral issues again and encouraged him to always bring any concerns to nursing staff or to me calmly and I am happy to listen, requested he maintain professionalism towards staff, he seems amenable. 7/11-7/14: goal OOB to chair, needing hoyer lift, pt reeducated several times about staying in bed 07/11 and has been compliant through 07/14. Finally got bariatric chair and hoyer lift 7/12 and was OOB 07/13.  D/c monitoring and sitter and transferred to [place] status 07/13. Awaiting  placement.  7/15-17- working to reorient patient, but personality and coherence remain difficult. Stable for placement. Reminded patient not to verbally abuse staff  Consultants:  PCCU Nephrology ENT GI IR Psychiatry  Procedures: 10/03/21: tracheostomy placement 10/12/21: dialysis permacath placement  10/13/21: G-tube placed  Subjective: Patient was sleeping comfortably when seen on rounds this morning.  He woke briefly denied any current acute complaints.  No acute events reported overnight.   ASSESSMENT & PLAN:   Principal Problem:   Acute on chronic respiratory failure with hypoxia and hypercapnia ([name]) Active Problems:   Major depressive disorder, recurrent episode, moderate ([name])   Acute on chronic diastolic congestive heart failure ([name])   COPD with acute exacerbation ([name])   Acute metabolic encephalopathy   HTN (hypertension)   Obesity, Class III, BMI 40-49.9 (morbid obesity) ([place])   Obstructive sleep apnea   Chronic kidney disease, stage 3a ([name])   HLD (hyperlipidemia)   Pressure injury of skin  Left foot blood blister - appreciate podiatry evaluation; underwent removal of blister bedside with podiatry on 7/21 - prevalon boots ordered to aid with pressure wounds going forward  Acute on Chronic Hypoxic Hypercapnic Respiratory Failure Acute exacerbation COPD Pseudomonal Pna OSA/OHS - S/p  tracheostomy by ENT by 6/20 - Trach collar w/ ventilator support as needed / qhs but he does NOT need vent supoort overnight routinely per conversation w/ Dr Milon Dikes 07/08 - Completed 10 day course of ceftazidime for treatment of Pseudomonas - At this point, he is medically stable but placement is going  to be an issue for him.  His behavioral issues have been discussed with him and his behavior has improved    Acute decompensated HFpEF Hypertension - Echocardiogram 08/07/2021: LVEF 60 to 58%, grade 1 diastolic dysfunction, normal RV systolic function - Was on intermittent  hemodialysis for volume removal  - has not needed HD and has remained compensated. Cancelled permacath placement    AKI superimposed CKD stage IIIa  Hypokalemia  Hypernatremia - During admissions in February 2023, creatinine 1.8 with GFR 40; in May 2023 creatinine 1.1 with GFR of 60 - Monitor I&O's / urinary output Follow BMP  - Cr down, good UOP noted, d/c dialysis catheter, monitor renal parameters daily. -cancelled permacath placement    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 - resolved  Pseudomonas Pneumonia  - Completed 10 day course of ceftazidime (started on 06/26) - C. Diff negative   Acute Blood Loss Anemia - resolved  Nasopharyngeal trauma vs Upper GI bleed - Continue BID IV PPI - per GI, no need for scope    Hyperglycemia - CBG's achs; patient refusing at times - SSI  Acute Metabolic Encephalopathy in setting of CO2 Narcosis Sedation needs in setting of mechanical ventilation - CT Head 09/19/21: negative;  UDS negative - Treatment of hypercapnia as outlined above  Trach in place Gtube in place - Was Requiring ventilatory support at night, has done well without this - Working on increasing po intake, SLP and dietary following  - Has not required ventilatory support, doing well on O2 via the trach - Continues to eat well; G-tube will have to remain in place at least 3 months prior to removal   DVT prophylaxis:lovenox Code Status: FULL Disposition Plan / TOC needs: to med-surg 07/13, awaiting placement  Barriers to discharge / significant pending items: remains on trach; behavioral issues  Objective: Vitals:   11/08/21 0500 11/08/21 0517 11/08/21 0816 11/08/21 0900  BP:  113/69  (!) 144/89  Pulse:  89 82 83  Resp:  '19 20 20  '$ Temp:  98.2 F (36.8 C)  98.4 F (36.9 C)  TempSrc:    Oral  SpO2:  (!) 86% 93% 93%  Weight: (!) 191 kg     Height:        Intake/Output Summary (Last 24 hours) at 11/08/2021 1325 Last data filed at 11/08/2021 0735 Gross per  24 hour  Intake 30 ml  Output 950 ml  Net -920 ml   Filed Weights   11/05/21 0443 11/06/21 0500 11/08/21 0500  Weight: (!) 190.5 kg (!) 190.5 kg (!) 191 kg   Physical Exam: General exam: Sleeping comfortably, responsive to voice briefly, no acute distress, morbidly obese HEENT: trach with PMV in place, trach collar, moist mucus membranes, hearing grossly normal  Respiratory system: Lungs clear without wheezes or rhonchi, normal respiratory effort at rest, trach collar on 8 L/min supplemental O2 Cardiovascular system: normal S1/S2, RRR, no pedal edema.   Gastrointestinal system: PEG present, soft non-tender, +bowel sounds. Central nervous system: no gross focal neurologic deficits, normal speech Extremities: moves all, no edema, normal tone Psychiatry: Unable to evaluate due to somnolence     Labs and data Reviewed:  No new labs today    LOS: 50 days     Ezekiel Slocumb, DO [facility] 11/08/2021, 1:25 PM    If 7PM-7AM, please contact night-coverage [email or web address]

## 2021-11-08 NOTE — Progress Notes (Signed)
PT Cancellation Note  Patient Details Name: Gary [name] MRN: [id number] DOB: Apr 18, 1955   Cancelled Treatment:    Reason Eval/Treat Not Completed: Patient declined, no reason specified. Chart reviewed. Upon entry pt declining PT currently. He wants to get up with therapy but feels drowsy due to medication. Asking PT to return later if able. PT to re-attempt as able.   Salem Caster. Fairly IV, PT, DPT Physical Therapist- [facility]  11/08/2021, 11:22 AM

## 2021-11-09 DIAGNOSIS — J9621 Acute and chronic respiratory failure with hypoxia: Secondary | ICD-10-CM | POA: Diagnosis not present

## 2021-11-09 DIAGNOSIS — J9622 Acute and chronic respiratory failure with hypercapnia: Secondary | ICD-10-CM | POA: Diagnosis not present

## 2021-11-09 LAB — PHOSPHORUS: Phosphorus: 4.5 mg/dL (ref 2.5–4.6)

## 2021-11-09 LAB — BASIC METABOLIC PANEL
Anion gap: 5 (ref 5–15)
BUN: 22 mg/dL (ref 8–23)
CO2: 36 mmol/L — ABNORMAL HIGH (ref 22–32)
Calcium: 8.7 mg/dL — ABNORMAL LOW (ref 8.9–10.3)
Chloride: 100 mmol/L (ref 98–111)
Creatinine, Ser: 1.17 mg/dL (ref 0.61–1.24)
GFR, Estimated: >60 mL/min (ref >60)
Glucose, Bld: 119 mg/dL — ABNORMAL HIGH (ref 70–99)
Potassium: 4.6 mmol/L (ref 3.5–5.1)
Sodium: 141 mmol/L (ref 135–145)

## 2021-11-09 LAB — MAGNESIUM: Magnesium: 1.9 mg/dL (ref 1.7–2.4)

## 2021-11-09 MED ORDER — TORSEMIDE 20 MG PO TABS
20.0000 mg | ORAL_TABLET | Freq: Every day | ORAL | Status: DC
  Administered 2021-11-09 – 2021-11-15 (×7): 20 mg via ORAL
  Filled 2021-11-09 (×7): qty 1

## 2021-11-09 NOTE — Progress Notes (Addendum)
In PROGRESS NOTE    Gary [name]  PJA:[id number] DOB: [birth year]  DOA: 09/19/2021 Date of Service: 11/09/21 PCP: Center, [facility] Course:  66 y/o male with multiple medical problems causing chronic hypercapneic respiratory failure who has had admissions in the past for exacerbations of the same and mechanical ventilation presented for evaluation of dyspnea, weakness. It is unclear if the patient has been compliant with his medications and NIMV at home.  He was admitted by the hospitalists for a presumed COPD exacerbation in the ER, treated with bronchodilators and NIMV but his mental status and hypercarbia worsened despite those interventions so he required intubation.   6/6: Presented to ED.  Required intubation and mechanical ventilation in the ED.  PCCM admit ICU 6/7: Hold diuresis due to worsening Creatinine.  ENT consulted for Trach placement per family request 6/8: Pt continues to spike temps tmax 101.8 degrees F.  Flexiseal placed overnight draining foul smelling watery stool~Cdiff and GI panel pending  6/9: Fever resolved.  Negative for C- diff, GI panel still pending.  Creatinine slightly worsened, requiring low dose levophed (2 mcg). Holding diuresis 6/10: propofol discontinued due hypertriglyceridemia; failed SBT 6/11: Tracheal aspirate from 6/6 resulting with Pseudomonas, ABX changed to Cefepime. neurologically intact, awaiting Trach placement 6/12: Pt calmer today following addition of oral benzo's, narcotics, and Seroquel via tube.  Creatinine worsened with decreased UOP, holding diuresis, low threshold for Nephrology consult  6/13: Pt with worsening renal failure creatinine 4.15.  UOP overnight 25 ml.  Nephrology consulted plans to start pt on hemodialysis follow dialysis catheter placement Pt underwent hemodialysis without ultrafiltration 06/14: .  Plans for HD session today.  Agitation has improved significantly with narcotics and antianxiety medications per  tube   06/16: Tracheostomy canceled 06/15 due to hyperkalemia and tentatively rescheduled for 06/20  6/18 failure to wean from vent 06/20: ENT placed Tracheostomy (#7.0 XLT cuffed).  Developed mild epistaxis post unsuccessful attempt at NGT placement.  Later developed hematochezia/melena, Protonix gtt started and GI consulted. 06/21: Diuresed yesterday with limited results. Patient had softed blood pressures throughout the day and diuresis was held as a result. HD was performed by the patient clotted off the machine and was not able to be rinsed back. Repeat hemoglobin is above 8. 06/23: Pt remains mechanically intubated settings: FiO2 50%/PEEP 10.  CXR concerning for moderate right pleural effusion with associated right basilar atelectasis vs. infiltrate.  Will perform [place] Chest to determine if pt needs right sided thoracentesis  06/24: Remains on the ventilator, FiO2 requirements decreasing, respiratory culture with abundant gram-negative rods: [name], on Meropenem 06/25: Oxygen requirements down to 40%, chest x-ray shows improvement on dense right consolidation, Pseudomonas susceptibilities pending 06/25: Pt remains on the ventilator settings: PEEP 10 and FiO2 40%; will attempt to wean PEEP to 5 to perform SBT  06/28: Pt tolerated PS on 06/27.  Overnight pt had episodes of vomiting TF's suspect secondary to malposition of nasogastric tube.  Will consult IR for PEG tube placement if unable to place PEG tube pt will need postpyloric Dobbhoff   06/29: Pt tolerated PS 5/5 for over an hour yesterday, however due to fatigue placed back on PRVC.  Pt pulled NGT out several times overnight, therefore will not attempt to reinsert.  Plans for PEG tube placement per IR on 06/30.   06/30: PEG tube placed per IR  07/1: Pt tolerated trach collar '@40'$ % throughout the day 07/2: Pt placed on ventilator overnight settings 16/5/35% tolerating well.  Will place pt on trach collar today and place orders for  PT/OT 7/3: Hemodynamically stable.  Tolerating TC (has remained on TC since 7/2 @ 08:00), plan for speech therapy and PT/OT 7/4: Has maintained on trach collar for greater than 48 hours, tolerating Passy-Muir valve.  Plan to transfer out of ICU. SLP recs: dysphagia 1 (puree) w/ nectar liquid.  7/5-7/6: sodium and creatinine trending appropriately, Hgb stable. Low K repleted and stable. Was off vent qhs (orders d/c or missed?) and was restarted. Will need LTAC, TOC following  7/7: pulled out trach overnight. Confusion re: vent plan among staff and myself. I asked PCCM to reevluate him and verify plan so the team and the patient can eb on the same page, and will help TOC arrange appropriate placement.  7/8: I spoke personally w/ Dr Milon Dikes [facility]) and he confirms patient does NOT need ventilator support, unless of course develops distress, but last night the patient tolerated well without it. Plan to meet w/ family today when they visit, I spoke w/ his sister yesterday on the phone. No concerns on CBC/BMP: Cr tending down a bit, Hgb stable, Na slightly high but improving over past few days. He is approaching medically stable and may be appropriate for LTAC vs SNF soon  7/9: Several long discussions with patient and family about medical condition, behavioral issues.  See separate progress notes.  Psychiatry evaluated, agree that he has capacity, started SSRI. 7/10: Patient remains medically stable, awaiting placement.  Discussed his behavioral issues again and encouraged him to always bring any concerns to nursing staff or to me calmly and I am happy to listen, requested he maintain professionalism towards staff, he seems amenable. 7/11-7/14: goal OOB to chair, needing hoyer lift, pt reeducated several times about staying in bed 07/11 and has been compliant through 07/14. Finally got bariatric chair and hoyer lift 7/12 and was OOB 07/13.  D/c monitoring and sitter and transferred to [place] status 07/13. Awaiting  placement.  7/15-17- working to reorient patient, but personality and coherence remain difficult. Stable for placement. Reminded patient not to verbally abuse staff  Consultants:  PCCU Nephrology ENT GI IR Psychiatry  Procedures: 10/03/21: tracheostomy placement 10/12/21: dialysis permacath placement  10/13/21: G-tube placed  Subjective: Patient was sleeping comfortably when seen on rounds this morning.  He woke briefly denied any current acute complaints.  No acute events reported overnight.  Per RN, patient refusing to have labs drawn today.   ASSESSMENT & PLAN:   Principal Problem:   Acute on chronic respiratory failure with hypoxia and hypercapnia ([name]) Active Problems:   Major depressive disorder, recurrent episode, moderate ([name])   Acute on chronic diastolic congestive heart failure ([name])   COPD with acute exacerbation ([name])   Acute metabolic encephalopathy   HTN (hypertension)   Obesity, Class III, BMI 40-49.9 (morbid obesity) ([place])   Obstructive sleep apnea   Chronic kidney disease, stage 3a ([name])   HLD (hyperlipidemia)   Pressure injury of skin  Left foot blood blister - appreciate podiatry evaluation; underwent removal of blister bedside with podiatry on 7/21 - prevalon boots ordered to aid with pressure wounds going forward  Acute on Chronic Hypoxic Hypercapnic Respiratory Failure Acute exacerbation COPD Pseudomonal Pna OSA/OHS - S/p  tracheostomy by ENT by 6/20 - Trach collar w/ ventilator support as needed / qhs but he does NOT need vent supoort overnight routinely per conversation w/ Dr Milon Dikes 07/08 - Completed 10 day course of ceftazidime for treatment of Pseudomonas - At  this point, he is medically stable but placement is going to be an issue for him.  His behavioral issues have been discussed with him and his behavior has improved  - Monitor mental status closely and check blood gas PRN - Has OHS and was reliant on nightly bipap prior to trach    Acute decompensated HFpEF Hypertension - Echocardiogram 08/07/2021: LVEF 60 to 32%, grade 1 diastolic dysfunction, normal RV systolic function - Was on intermittent hemodialysis for volume removal  - has not needed HD and has remained compensated. Cancelled permacath placement  - resumed home torsemide 7/27   AKI superimposed CKD stage IIIa  Hypokalemia  Hypernatremia - During admissions in February 2023, creatinine 1.8 with GFR 40; in May 2023 creatinine 1.1 with GFR of 60 - Monitor I&O's / urinary output Follow BMP (pt refusing labs) - Cr down, good UOP noted, d/c dialysis catheter   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 - resolved  Pseudomonas Pneumonia  - Completed 10 day course of ceftazidime (started on 06/26) - C. Diff negative   Acute Blood Loss Anemia - resolved  Nasopharyngeal trauma vs Upper GI bleed - Continue BID IV PPI - per GI, no need for scope    Hyperglycemia - CBG's achs; patient refusing at times - SSI  Acute Metabolic Encephalopathy in setting of CO2 Narcosis Sedation needs in setting of mechanical ventilation - CT Head 09/19/21: negative;  UDS negative - Treatment of hypercapnia as outlined above   Trach in place Gtube in place - Was Requiring ventilatory support at night, has done well without this - PO intake has improved, SLP and dietary following  - Has not required ventilatory support, doing well on O2 via the trach - Continues to eat well; G-tube will have to remain in place at least 3 months prior to removal   DVT prophylaxis:lovenox Code Status: FULL Disposition Plan / TOC needs: to med-surg 07/13, awaiting placement  Barriers to discharge / significant pending items: remains on trach; behavioral issues  Objective: Vitals:   11/09/21 0712 11/09/21 0742 11/09/21 0756 11/09/21 1400  BP:   122/68   Pulse:   90   Resp:   (!) 22   Temp:   98.1 F (36.7 C)   TempSrc:      SpO2: (!) 86% 94% 93% 99%  Weight:      Height:         Intake/Output Summary (Last 24 hours) at 11/09/2021 1450 Last data filed at 11/09/2021 0849 Gross per 24 hour  Intake 30 ml  Output --  Net 30 ml   Filed Weights   11/05/21 0443 11/06/21 0500 11/08/21 0500  Weight: (!) 190.5 kg (!) 190.5 kg (!) 191 kg   Physical Exam: General exam: Sleeping comfortably, responsive to voice, no acute distress, morbidly obese HEENT: trach with PMV in place, trach collar, moist mucus membranes, hearing grossly normal  Respiratory system: Lungs clear without wheezes or rhonchi, normal respiratory effort at rest, trach collar on 9 L/min supplemental O2 Cardiovascular system: normal S1/S2, RRR Gastrointestinal system: PEG present, soft non-tender, +bowel sounds. Central nervous system: no gross focal neurologic deficits, normal speech Extremities: moves all, trace lower extremity edema     Labs and data Reviewed:  No new labs today    LOS: 51 days     Ezekiel Slocumb, DO [facility] 11/09/2021, 2:50 PM    If 7PM-7AM, please contact night-coverage [email or web address]

## 2021-11-09 NOTE — TOC Progression Note (Addendum)
Transition of Care [facility]) - Progression Note    Patient Details  Name: JELANI TRUEBA MRN: [id number] Date of Birth: 24-Jul-1955  [facility]) CM/SW Contact  Eileen Stanford, LCSW Phone Number: 11/09/2021, 9:08 AM  Clinical Narrative:   Pt continues to be discussed in DTP. Recommendation was to fax referral to [facility] in [place]. CSW faxed referral to [phone].  CSW spoke with admissions at [facility]  and she states pt is over their weight limit they can not accept.  Barriers : behaviors, size, trach, peg   Expected Discharge Plan: Long Term Acute Care (LTAC) Barriers to Discharge: Continued Medical Work up  Expected Discharge Plan and Services Expected Discharge Plan: Long Term Acute Care (LTAC)   Discharge Planning Services: CM Consult Post Acute Care Choice: Long Term Acute Care (LTAC) Living arrangements for the past 2 months: Single Family Home                 DME Arranged: N/A DME Agency: NA       HH Arranged: NA HH Agency: NA         Social Determinants of Health (SDOH) Interventions    Readmission Risk Interventions    05/04/2021    1:26 PM 04/07/2021    1:39 PM 03/12/2021   11:54 AM  Readmission Risk Prevention Plan  Transportation Screening Complete Complete Complete  PCP or Specialist Appt within 3-5 Days  Complete Complete  HRI or [place]   Complete  Social Work Consult for [place] Planning/Counseling   Complete  Palliative Care Screening  Not Applicable Not Applicable  Medication Review Press photographer) Complete Complete Complete  PCP or Specialist appointment within 3-5 days of discharge Complete    HRI or [place] Complete    SW Recovery Care/Counseling Consult Complete    [place] Not Applicable

## 2021-11-09 NOTE — Progress Notes (Signed)
Physical Therapy Treatment Patient Details Name: Gary [name] MRN: [id number] DOB: 12-Dec-1955 Today's Date: 11/09/2021   History of Present Illness Patient was admitted secondary to SOB, COPD exacerbation, acute metabolic encephalopathy, and acute on chronic hypoxic and hypercapnic respiratory failure in setting of acute decompensated HFpEF and AECOPD requiring intubation and mechanical ventilation. The pt is now s/p tracheostomy and PEG tube placement 11/05/21.    PT Comments    Pt received with PT/OT co-treat. Initially Spo2 in supine in 68-72%. With [facility] elevated maximally, Spo2 returns to >93% at rest and with mobility. Pt endorsing BM thus requiring modA for R side rolling dependent from OT for pericare. Requiring modA+2 to transfer to EOB with VC's for UE/LE sequencing initially with poor seated balance until feet positioned on floor and HOB flattened. Pt requires all extremities in contact of firm surface for safe static sitting. Pt overall very self limiting in standing attempts reporting he is "too weak". Extensive education provided this is the reason rehab is working with pt. Education on need for standing with therapy and expectations of therapy if pt wishes to return to baseline. Further declining standing attempts despite encouragement and education this date. Tolerated limited sitting therex with R knee extensors 2/5 on RLE and 3/5 on LLE. Educated on expectations to stand next time therapy repots to work with pt. Encouraged to [employer] lift to recliner with nursing. Pt returned to supine with extensive two person assist with all needs in reach. D/c recs remain appropriate.    Recommendations for follow up therapy are one component of a multi-disciplinary discharge planning process, led by the attending physician.  Recommendations may be updated based on patient status, additional functional criteria and insurance authorization.  Follow Up Recommendations  Skilled nursing-short term rehab  (<3 hours/day) Can patient physically be transported by private vehicle: No   Assistance Recommended at Discharge Frequent or constant Supervision/Assistance  Patient can return home with the following Two people to help with walking and/or transfers;Two people to help with bathing/dressing/bathroom;Assistance with cooking/housework;Assist for transportation;Help with stairs or ramp for entrance   Equipment Recommendations  Other (comment) (TBD by next venue of care)    Recommendations for Other Services       Precautions / Restrictions Precautions Precautions: Fall Precaution Comments: IJ, G tube, trach Restrictions Weight Bearing Restrictions: No Other Position/Activity Restrictions: Paring of blood blister on LLE 11/03/21; Per Dr Luana Shu no weight bearing restrictions on LLE     Mobility  Bed Mobility Overal bed mobility: Needs Assistance Bed Mobility: Supine to Sit, Sit to Supine     Supine to sit: Mod assist, +2 for physical assistance, HOB elevated Sit to supine: Mod assist, +2 for physical assistance   General bed mobility comments: max A+2 to scoot up to [facility] until pt able to grab bar at [facility] to assist in pulling    Transfers                   General transfer comment: denies attempts to stand. Self limiting despite education, encouragement, use of AD and having two person assist    Ambulation/Gait                   Stairs             Wheelchair Mobility    Modified Rankin (Stroke Patients Only)       Balance Overall balance assessment: Needs assistance Sitting-balance support: Bilateral upper extremity supported, Feet supported Sitting balance-[name] Scale: [place] Sitting  balance - Comments: initially poor until able to place feet on ground       Standing balance comment: refuses attempts to stand this date                            Cognition Arousal/Alertness: Awake/alert Behavior During Therapy: WFL for tasks  assessed/performed Overall Cognitive Status: Within Functional Limits for tasks assessed                                 General Comments: Does not seem to be responsive to education and goals of therapy for pt outcomes        Exercises General Exercises - Lower Extremity Long Arc Quad: Strengthening, Both, 10 reps, AAROM, Seated Other Exercises Other Exercises: Extensive education on attempts to stand and begin transfers for successful return to ambulation otherwise expect further deconditioning.    General Comments General comments (skin integrity, edema, etc.): Initially in supine SPo2 on 9 L/min ranging from 79-72%. Upon elevating HOB, SPo2 returns > 93% throughout rest and with mobility.      Pertinent Vitals/Pain Pain Assessment Pain Assessment: No/denies pain    Home Living                          Prior Function            PT Goals (current goals can now be found in the care plan section) Acute Rehab PT Goals Patient Stated Goal: get back to baseline PT Goal Formulation: With patient Time For Goal Achievement: 11/23/21 Potential to Achieve Goals: Fair Progress towards PT goals: Not progressing toward goals - comment (self limiting, Unwilling to try standing at EOB)    Frequency           PT Plan Current plan remains appropriate    Co-evaluation PT/OT/SLP Co-Evaluation/Treatment: Yes Reason for Co-Treatment: Complexity of the patient's impairments (multi-system involvement);For patient/therapist safety;To address functional/ADL transfers PT goals addressed during session: Mobility/safety with mobility;Balance;Strengthening/ROM OT goals addressed during session: ADL's and self-care      AM-PAC PT "6 Clicks" Mobility   Outcome Measure  Help needed turning from your back to your side while in a flat bed without using bedrails?: A Lot Help needed moving from lying on your back to sitting on the side of a flat bed without using  bedrails?: A Lot Help needed moving to and from a bed to a chair (including a wheelchair)?: A Lot Help needed standing up from a chair using your arms (e.g., wheelchair or bedside chair)?: Total Help needed to walk in hospital room?: Total Help needed climbing 3-5 steps with a railing? : Total 6 Click Score: 9    End of Session Equipment Utilized During Treatment: Oxygen Activity Tolerance: Other (comment) (self limiting) Patient left: in bed;with call bell/phone within reach Nurse Communication: Mobility status PT Visit Diagnosis: Unsteadiness on feet (R26.81);Muscle weakness (generalized) (M62.81);Difficulty in walking, not elsewhere classified (R26.2)     Time: 8127-5170 PT Time Calculation (min) (ACUTE ONLY): 41 min  Charges:  $Therapeutic Activity: 23-37 mins                     Raneisha Bress M. Fairly IV, PT, DPT Physical Therapist- [facility]  11/09/2021, 11:42 AM

## 2021-11-09 NOTE — Plan of Care (Signed)
  Problem: Education: Goal: Ability to demonstrate management of disease process will improve Outcome: Progressing Goal: Ability to verbalize understanding of medication therapies will improve Outcome: Progressing   Problem: Activity: Goal: Capacity to carry out activities will improve Outcome: Progressing   Problem: Cardiac: Goal: Ability to achieve and maintain adequate cardiopulmonary perfusion will improve Outcome: Progressing   

## 2021-11-09 NOTE — Progress Notes (Signed)
Occupational Therapy Treatment Patient Details Name: Gary [name] MRN: [id number] DOB: July 17, 1955 Today's Date: 11/09/2021   History of present illness Patient was admitted secondary to SOB, COPD exacerbation, acute metabolic encephalopathy, and acute on chronic hypoxic and hypercapnic respiratory failure in setting of acute decompensated HFpEF and AECOPD requiring intubation and mechanical ventilation. The pt is now s/p tracheostomy and PEG tube placement 11/05/21.   OT comments  Chart reviewed, Rn cleared pt for participation in OT tx session. Co tx completed with PT on this date. Tx session targeted improving activity tolerance to improve ADL participation. Pt required significant encouragement for participation in therapy, pt appears self limiting especially when encouraged to stand. MOD A +2 required for rolling, TOTAL A for peri care. Pt able to sit on EOB with CGA-supervision for approx 20 minutes. Pt encouraged further mobility, pt declines. Pt educated on importance of attempting to stand, encouraged to transfer to bedside chair with nursing later on this date. Pt is left as received, NAD, all needs met. OT will follow acutely.        Recommendations for follow up therapy are one component of a multi-disciplinary discharge planning process, led by the attending physician.  Recommendations may be updated based on patient status, additional functional criteria and insurance authorization.    Follow Up Recommendations  Skilled nursing-short term rehab (<3 hours/day)    Assistance Recommended at Discharge Frequent or constant Supervision/Assistance  Patient can return home with the following  Two people to help with walking and/or transfers;Assistance with cooking/housework;Direct supervision/assist for medications management;Assist for transportation;Two people to help with bathing/dressing/bathroom   Equipment Recommendations  Other (comment) (per next venue of care)     Recommendations for Other Services      Precautions / Restrictions Precautions Precautions: Fall Precaution Comments: IJ, G tube, trach Restrictions Weight Bearing Restrictions: No Other Position/Activity Restrictions: Paring of blood blister on LLE 11/03/21; Per Dr Luana Shu no weight bearing restrictions on LLE       Mobility Bed Mobility Overal bed mobility: Needs Assistance Bed Mobility: Rolling Rolling: Min assist, Mod assist   Supine to sit: Mod assist, +2 for physical assistance, HOB elevated Sit to supine: Mod assist, +2 for physical assistance        Transfers                   General transfer comment: pt declines attempt to stand. Pt appears self limiting.     Balance Overall balance assessment: Needs assistance Sitting-balance support: Bilateral upper extremity supported, Feet supported Sitting balance-[name] Scale: Fair                                     ADL either performed or assessed with clinical judgement   ADL Overall ADL's : Needs assistance/impaired                                       General ADL Comments: SET UP for seated grooming tasks, MAX A to donn socks, TOTAL A for peri care following BM    Extremity/Trunk Assessment              Vision       Perception     Praxis      Cognition Arousal/Alertness: Awake/alert Behavior During Therapy: WFL for tasks assessed/performed Overall Cognitive Status: Within  Functional Limits for tasks assessed                                          Exercises      Shoulder Instructions       General Comments Initially in supine spo2 9L via collar in low 70s,elevate HOB, Spo2 >90% throughout treatment session    Pertinent Vitals/ Pain       Pain Assessment Pain Assessment: No/denies pain  Home Living                                          Prior Functioning/Environment              Frequency  Min 2X/week         Progress Toward Goals  OT Goals(current goals can now be found in the care plan section)  Progress towards OT goals: Progressing toward goals     Plan Discharge plan remains appropriate;Frequency remains appropriate    Co-evaluation      Reason for Co-Treatment: Complexity of the patient's impairments (multi-system involvement);For patient/therapist safety;To address functional/ADL transfers PT goals addressed during session: Mobility/safety with mobility;Balance;Strengthening/ROM OT goals addressed during session: ADL's and self-care      AM-PAC OT "6 Clicks" Daily Activity     Outcome Measure   Help from another person eating meals?: A Little Help from another person taking care of personal grooming?: A Little Help from another person toileting, which includes using toliet, bedpan, or urinal?: Total Help from another person bathing (including washing, rinsing, drying)?: A Lot Help from another person to put on and taking off regular upper body clothing?: A Little Help from another person to put on and taking off regular lower body clothing?: A Lot 6 Click Score: 14    End of Session Equipment Utilized During Treatment: Oxygen;Other (comment) (trach, pmv on)  OT Visit Diagnosis: Other abnormalities of gait and mobility (R26.89);Muscle weakness (generalized) (M62.81)   Activity Tolerance Other (comment) (pt is self limiting)   Patient Left in bed;with call bell/phone within reach   Nurse Communication Mobility status        Time: 3300-7622 OT Time Calculation (min): 41 min  Charges: OT General Charges $OT Visit: 1 Visit OT Treatments $Self Care/Home Management : 8-22 mins  Shanon Payor, OTD OTR/L  11/09/21, 12:51 PM

## 2021-11-09 NOTE — Plan of Care (Signed)
  Problem: Nutrition: Goal: Adequate nutrition will be maintained Outcome: Progressing   Problem: Safety: Goal: Ability to remain free from injury will improve Outcome: Progressing   

## 2021-11-10 ENCOUNTER — Inpatient Hospital Stay: Payer: Medicaid Other

## 2021-11-10 DIAGNOSIS — J9622 Acute and chronic respiratory failure with hypercapnia: Secondary | ICD-10-CM | POA: Diagnosis not present

## 2021-11-10 DIAGNOSIS — J9621 Acute and chronic respiratory failure with hypoxia: Secondary | ICD-10-CM | POA: Diagnosis not present

## 2021-11-10 LAB — CBC
HCT: 36.8 % — ABNORMAL LOW (ref 39.0–52.0)
Hemoglobin: 9.8 g/dL — ABNORMAL LOW (ref 13.0–17.0)
MCH: 22.3 pg — ABNORMAL LOW (ref 26.0–34.0)
MCHC: 26.6 g/dL — ABNORMAL LOW (ref 30.0–36.0)
MCV: 83.6 fL (ref 80.0–100.0)
Platelets: 153 10*3/uL (ref 150–400)
RBC: 4.4 MIL/uL (ref 4.22–5.81)
RDW: 17.2 % — ABNORMAL HIGH (ref 11.5–15.5)
WBC: 7 10*3/uL (ref 4.0–10.5)
nRBC: 0.6 % — ABNORMAL HIGH (ref 0.0–0.2)

## 2021-11-10 LAB — BLOOD GAS, ARTERIAL
Acid-Base Excess: 10.4 mmol/L — ABNORMAL HIGH (ref 0.0–2.0)
Bicarbonate: 39.5 mmol/L — ABNORMAL HIGH (ref 20.0–28.0)
FIO2: 60 %
O2 Saturation: 95.5 %
Patient temperature: 37
pCO2 arterial: 75 mmHg (ref 32–48)
pH, Arterial: 7.33 — ABNORMAL LOW (ref 7.35–7.45)
pO2, Arterial: 75 mmHg — ABNORMAL LOW (ref 83–108)

## 2021-11-10 LAB — GLUCOSE, CAPILLARY
Glucose-Capillary: 148 mg/dL — ABNORMAL HIGH (ref 70–99)
Glucose-Capillary: 150 mg/dL — ABNORMAL HIGH (ref 70–99)

## 2021-11-10 LAB — BRAIN NATRIURETIC PEPTIDE: B Natriuretic Peptide: 34.8 pg/mL (ref 0.0–100.0)

## 2021-11-10 MED ORDER — ARFORMOTEROL TARTRATE 15 MCG/2ML IN NEBU
15.0000 ug | INHALATION_SOLUTION | Freq: Two times a day (BID) | RESPIRATORY_TRACT | Status: DC
  Administered 2021-11-10 – 2021-12-13 (×61): 15 ug via RESPIRATORY_TRACT
  Filled 2021-11-10 (×71): qty 2

## 2021-11-10 MED ORDER — PREDNISONE 20 MG PO TABS: 40.0000 mg | ORAL_TABLET | Freq: Every day | ORAL | Status: DC

## 2021-11-10 MED ORDER — PREDNISONE 20 MG PO TABS
40.0000 mg | ORAL_TABLET | Freq: Every day | ORAL | Status: DC
  Administered 2021-11-10 – 2021-11-12 (×3): 40 mg via ORAL
  Filled 2021-11-10 (×3): qty 2

## 2021-11-10 NOTE — Progress Notes (Signed)
Physical Therapy Treatment Patient Details Name: Gary [name] MRN: [id number] DOB: 04-20-1955 Today's Date: 11/10/2021   History of Present Illness Patient was admitted secondary to SOB, COPD exacerbation, acute metabolic encephalopathy, and acute on chronic hypoxic and hypercapnic respiratory failure in setting of acute decompensated HFpEF and AECOPD requiring intubation and mechanical ventilation. The pt is now s/p tracheostomy and PEG tube placement 11/05/21.    PT Comments    Pt seen for PT tx with pt agreeable to tx. Pt with trach collar donned & lowest SPO2 noted was 85% during session but pt recovers quickly. Pt requires extra time to complete all movement but is able to complete supine>sit with min assist, sit>supine with mod assist. Pt engages in BLE strengthening exercises sitting EOB but then appears to withdraw & begins declining all standing attempts without reason. PT encouraged pt to scoot to L to [facility] with pt performing a few before returning supine. PT provides encouragement/education for increased participation but pt appears to have impaired awareness as he reports he will be able to do better once he's in a smaller room where he can hold to walls to begin walking. Pt also reports he's been in the hospital for a month at a time before & was able to walk after. Will continue to follow pt acutely to address strength, balance, and gait as able.  Of note, staff reporting pt is being moved to a higher level of care, but MD cleared pt for participation in PT.   Recommendations for follow up therapy are one component of a multi-disciplinary discharge planning process, led by the attending physician.  Recommendations may be updated based on patient status, additional functional criteria and insurance authorization.  Follow Up Recommendations  Skilled nursing-short term rehab (<3 hours/day) Can patient physically be transported by private vehicle: No   Assistance Recommended at  Discharge Frequent or constant Supervision/Assistance  Patient can return home with the following Two people to help with walking and/or transfers;Two people to help with bathing/dressing/bathroom;Assistance with cooking/housework;Assist for transportation;Help with stairs or ramp for entrance   Equipment Recommendations  None recommended by PT (TBD in next venue)    Recommendations for Other Services       Precautions / Restrictions Precautions Precautions: Fall Precaution Comments: IJ, G tube, trach Restrictions Weight Bearing Restrictions: No Other Position/Activity Restrictions: Paring of blood blister on LLE 11/03/21; Per Dr Luana Shu no weight bearing restrictions on LLE     Mobility  Bed Mobility Overal bed mobility: Needs Assistance Bed Mobility: Supine to Sit     Supine to sit: Min assist, HOB elevated (requires significantly extra time & completes entire movement supine>sit in small movements, uses HOB significantly elevated with bed rails, PT assists with moving RLE to EOB) Sit to supine: Mod assist, HOB elevated (assistance to move BLE onto bed)   General bed mobility comments: Pt assists with scooting to [facility] with bed in trendelenburg position but also requires +2 assist at times, PT also provides cuing to push with legs to assist with scooting    Transfers                        Ambulation/Gait                   Stairs             Wheelchair Mobility    Modified Rankin (Stroke Patients Only)       Balance Overall balance assessment: Needs  assistance Sitting-balance support: Feet supported, No upper extremity supported Sitting balance-[name] Scale: Fair                                      Cognition Arousal/Alertness: Awake/alert Behavior During Therapy: WFL for tasks assessed/performed                                   General Comments: Pt very self limiting, will not respond to PT at times         Exercises General Exercises - Lower Extremity Long Arc Quad: AROM, Strengthening, Both, 20 reps, Seated    General Comments        Pertinent Vitals/Pain Pain Assessment Pain Assessment:  (notes soreness/stiffness but doesn't rate nor describe location; monitored during session)    Home Living                          Prior Function            PT Goals (current goals can now be found in the care plan section) Acute Rehab PT Goals Patient Stated Goal: get back to baseline PT Goal Formulation: With patient Time For Goal Achievement: 11/23/21 Potential to Achieve Goals: Fair Progress towards PT goals: Progressing toward goals    Frequency    Min 2X/week      PT Plan Current plan remains appropriate    Co-evaluation              AM-PAC PT "6 Clicks" Mobility   Outcome Measure  Help needed turning from your back to your side while in a flat bed without using bedrails?: A Lot Help needed moving from lying on your back to sitting on the side of a flat bed without using bedrails?: A Lot Help needed moving to and from a bed to a chair (including a wheelchair)?: Total Help needed standing up from a chair using your arms (e.g., wheelchair or bedside chair)?: Total Help needed to walk in hospital room?: Total Help needed climbing 3-5 steps with a railing? : Total 6 Click Score: 8    End of Session Equipment Utilized During Treatment: Oxygen Activity Tolerance:  (pt is self limiting) Patient left: in bed;with call bell/phone within reach;with nursing/sitter in room   PT Visit Diagnosis: Unsteadiness on feet (R26.81);Muscle weakness (generalized) (M62.81);Difficulty in walking, not elsewhere classified (R26.2)     Time: 3557-3220 PT Time Calculation (min) (ACUTE ONLY): 36 min  Charges:  $Therapeutic Activity: 23-37 mins                     Lavone Nian, PT, DPT 11/10/21, 3:38 PM   [name] 11/10/2021, 3:35 PM

## 2021-11-10 NOTE — Progress Notes (Addendum)
In PROGRESS NOTE    Gary [name]  LSL:[id number] DOB: 1955/08/10  DOA: 09/19/2021 Date of Service: 11/10/21 PCP: [facility] Course:  66 y/o male with multiple medical problems causing chronic hypercapneic respiratory failure who has had admissions in the past for exacerbations of the same and mechanical ventilation presented for evaluation of dyspnea, weakness. It is unclear if the patient has been compliant with his medications and NIMV at home.  He was admitted by the hospitalists for a presumed COPD exacerbation in the ER, treated with bronchodilators and NIMV but his mental status and hypercarbia worsened despite those interventions so he required intubation.   6/6: Presented to ED.  Required intubation and mechanical ventilation in the ED.  PCCM admit ICU 6/7: Hold diuresis due to worsening Creatinine.  ENT consulted for Trach placement per family request 6/8: Pt continues to spike temps tmax 101.8 degrees F.  Flexiseal placed overnight draining foul smelling watery stool~Cdiff and GI panel pending  6/9: Fever resolved.  Negative for C- diff, GI panel still pending.  Creatinine slightly worsened, requiring low dose levophed (2 mcg). Holding diuresis 6/10: propofol discontinued due hypertriglyceridemia; failed SBT 6/11: Tracheal aspirate from 6/6 resulting with Pseudomonas, ABX changed to Cefepime. neurologically intact, awaiting Trach placement 6/12: Pt calmer today following addition of oral benzo's, narcotics, and Seroquel via tube.  Creatinine worsened with decreased UOP, holding diuresis, low threshold for Nephrology consult  6/13: Pt with worsening renal failure creatinine 4.15.  UOP overnight 25 ml.  Nephrology consulted plans to start pt on hemodialysis follow dialysis catheter placement Pt underwent hemodialysis without ultrafiltration 06/14: .  Plans for HD session today.  Agitation has improved significantly with narcotics and antianxiety medications per  tube   06/16: Tracheostomy canceled 06/15 due to hyperkalemia and tentatively rescheduled for 06/20  6/18 failure to wean from vent 06/20: ENT placed Tracheostomy (#7.0 XLT cuffed).  Developed mild epistaxis post unsuccessful attempt at NGT placement.  Later developed hematochezia/melena, Protonix gtt started and GI consulted. 06/21: Diuresed yesterday with limited results. Patient had softed blood pressures throughout the day and diuresis was held as a result. HD was performed by the patient clotted off the machine and was not able to be rinsed back. Repeat hemoglobin is above 8. 06/23: Pt remains mechanically intubated settings: FiO2 50%/PEEP 10.  CXR concerning for moderate right pleural effusion with associated right basilar atelectasis vs. infiltrate.  Will perform [place] Chest to determine if pt needs right sided thoracentesis  06/24: Remains on the ventilator, FiO2 requirements decreasing, respiratory culture with abundant gram-negative rods: [name], on Meropenem 06/25: Oxygen requirements down to 40%, chest x-ray shows improvement on dense right consolidation, Pseudomonas susceptibilities pending 06/25: Pt remains on the ventilator settings: PEEP 10 and FiO2 40%; will attempt to wean PEEP to 5 to perform SBT  06/28: Pt tolerated PS on 06/27.  Overnight pt had episodes of vomiting TF's suspect secondary to malposition of nasogastric tube.  Will consult IR for PEG tube placement if unable to place PEG tube pt will need postpyloric Dobbhoff   06/29: Pt tolerated PS 5/5 for over an hour yesterday, however due to fatigue placed back on PRVC.  Pt pulled NGT out several times overnight, therefore will not attempt to reinsert.  Plans for PEG tube placement per IR on 06/30.   06/30: PEG tube placed per IR  07/1: Pt tolerated trach collar _0 % throughout the day 07/2: Pt placed on ventilator overnight settings 16/5/35% tolerating well.  Will place pt on trach collar today and place orders for  PT/OT 7/3: Hemodynamically stable.  Tolerating TC (has remained on TC since 7/2 @ 08:00), plan for speech therapy and PT/OT 7/4: Has maintained on trach collar for greater than 48 hours, tolerating Passy-Muir valve.  Plan to transfer out of ICU. SLP recs: dysphagia 1 (puree) w/ nectar liquid.  7/5-7/6: sodium and creatinine trending appropriately, Hgb stable. Low K repleted and stable. Was off vent qhs (orders d/c or missed?) and was restarted. Will need LTAC, TOC following  7/7: pulled out trach overnight. Confusion re: vent plan among staff and myself. I asked PCCM to reevluate him and verify plan so the team and the patient can eb on the same page, and will help TOC arrange appropriate placement.  7/8: I spoke personally w/ Dr Milon [facility]) and he confirms patient does NOT need ventilator support, unless of course develops distress, but last night the patient tolerated well without it. Plan to meet w/ family today when they visit, I spoke w/ his sister yesterday on the phone. No concerns on CBC/BMP: Cr tending down a bit, Hgb stable, Na slightly high but improving over past few days. He is approaching medically stable and may be appropriate for LTAC vs SNF soon  7/9: Several long discussions with patient and family about medical condition, behavioral issues.  See separate progress notes.  Psychiatry evaluated, agree that he has capacity, started SSRI. 7/10: Patient remains medically stable, awaiting placement.  Discussed his behavioral issues again and encouraged him to always bring any concerns to nursing staff or to me calmly and I am happy to listen, requested he maintain professionalism towards staff, he seems amenable. 7/11-7/14: goal OOB to chair, needing hoyer lift, pt reeducated several times about staying in bed 07/11 and has been compliant through 07/14. Finally got bariatric chair and hoyer lift 7/12 and was OOB 07/13.  D/c monitoring and sitter and transferred to [place] status 07/13. Awaiting  placement.  7/15-17- working to reorient patient, but personality and coherence remain difficult. Stable for placement. Reminded patient not to verbally abuse staff 7/28 - rising O2 needs past couple of days, due to mucus plugging.  ABG stable, normal mentation.  Improved after suctioning.  Consultants:  PCCU Nephrology ENT GI IR Psychiatry  Procedures: 10/03/21: tracheostomy placement 10/12/21: dialysis permacath placement  10/13/21: G-tube placed  Subjective: Patient was sleeping comfortably when seen on rounds this morning.  He woke briefly denied any current acute complaints.  No acute events reported overnight.  Per RN, patient refusing to have labs drawn today.   ASSESSMENT & PLAN:   Principal Problem:   Acute on chronic respiratory failure with hypoxia and hypercapnia ([name]) Active Problems:   Major depressive disorder, recurrent episode, moderate ([name])   Acute on chronic diastolic congestive heart failure ([name])   COPD with acute exacerbation ([name])   Acute metabolic encephalopathy   HTN (hypertension)   Obesity, Class III, BMI 40-49.9 (morbid obesity) ([place])   Obstructive sleep apnea   Chronic kidney disease, stage 3a ([name])   HLD (hyperlipidemia)   Pressure injury of skin  Left foot blood blister - appreciate podiatry evaluation; underwent removal of blister bedside with podiatry on 7/21 - prevalon boots ordered to aid with pressure wounds going forward  Acute on Chronic Hypoxic Hypercapnic Respiratory Failure Acute exacerbation COPD Pseudomonal Pna OSA/OHS - S/p  tracheostomy by ENT by 6/20 - Trach collar w/ ventilator support as needed / qhs but he does NOT need vent  supoort overnight routinely per conversation w/ Dr Milon Dikes 07/08 - Completed 10 day course of ceftazidime for treatment of Pseudomonas - At this point, he is medically stable but placement is going to be an issue for him.  His behavioral issues have been discussed with him and his behavior has  improved  - Monitor mental status closely and check blood gas PRN - Has OHS and was reliant on nightly bipap prior to trach 7/28 - rising O2 needs and desatting episodes to 50's this AM.  CXR with no PNA, o nly mild pulmonary vascular congestion, BNP normal. No fevers or cough.  ABG today with PCO2 75 (near baseline for him) and patient mentating well.  Continue to monitor. --PCCM updated --Add Brovana nebs BID --Suction for mucus plugging PRN --Transfer to [facility] for closer monitoring   Bilateral hand and foot pain - hx of gout, treated for a few days with colchicine last week.  Now having diffuse joint pains in hands and feel.   Pt and family ?rheumatoid arthritis.  Uric acid level was normal last week --Trial prednisone --Check ESR, CRP, RF --Monitor for improvement  Acute decompensated HFpEF Hypertension - Echocardiogram 08/07/2021: LVEF 60 to 09%, grade 1 diastolic dysfunction, normal RV systolic function - Was on intermittent hemodialysis for volume removal  - has not needed HD and has remained compensated. Cancelled permacath placement  - resumed home torsemide 7/27   AKI superimposed CKD stage IIIa  Hypokalemia  Hypernatremia - During admissions in February 2023, creatinine 1.8 with GFR 40; in May 2023 creatinine 1.1 with GFR of 60 - Monitor I&O's / urinary output Follow BMP (pt refusing labs) - Cr down, good UOP noted, d/c dialysis catheter   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 - resolved  Pseudomonas Pneumonia  - Completed 10 day course of ceftazidime (started on 06/26) - C. Diff negative   Acute Blood Loss Anemia - resolved  Nasopharyngeal trauma vs Upper GI bleed - Continue BID IV PPI - per GI, no need for scope    Hyperglycemia - CBG's achs; patient refusing at times - SSI  Acute Metabolic Encephalopathy in setting of CO2 Narcosis Sedation needs in setting of mechanical ventilation - CT Head 09/19/21: negative;  UDS negative - Treatment of  hypercapnia as outlined above   Trach in place Gtube in place - No longer requiring vent support - PO intake has improved, SLP and dietary following  -Continue trach collar supplemental O2 to maintain sats >88% - Continues to eat well; G-tube will have to remain in place at least 3 months prior to removal   DVT prophylaxis:lovenox Code Status: FULL Disposition Plan / TOC needs: to med-surg 07/13, awaiting placement  Barriers to discharge / significant pending items: remains on trach; behavioral issues  Objective: Vitals:   11/10/21 0500 11/10/21 0808 11/10/21 0810 11/10/21 0820  BP:    115/71  Pulse:    88  Resp:    18  Temp:    99.4 F (37.4 C)  TempSrc:      SpO2:  (!) 59% 90% (!) 89%  Weight: (!) 185.1 kg     Height:        Intake/Output Summary (Last 24 hours) at 11/10/2021 1520 Last data filed at 11/10/2021 1300 Gross per 24 hour  Intake 720 ml  Output 1475 ml  Net -755 ml   Filed Weights   11/06/21 0500 11/08/21 0500 11/10/21 0500  Weight: (!) 190.5 kg (!) 191 kg (!) 185.1 kg   Physical  Exam: General exam: Awake resting in bed visiting with family, no acute distress, morbidly obese HEENT: trach with PMV in place, trach collar, moist mucus membranes, hearing grossly normal  Respiratory system: Lungs clear without wheezes or rhonchi, normal respiratory effort at rest, trach collar on 9 L/min supplemental O2 with O2 sat 91 to 95% on monitor during encounter Cardiovascular system: normal S1/S2, RRR Gastrointestinal system: PEG present, soft non-tender, +bowel sounds. Central nervous system: no gross focal neurologic deficits, normal speech Extremities: moves all, no lower extremity edema, no focal joint swelling seen in the hands but tenderness on palpation over MCPs and PIPs bilaterally    Labs and data Reviewed:  No new labs today    LOS: 52 days     Ezekiel Slocumb, DO [facility] 11/10/2021, 3:20 PM    If 7PM-7AM, please contact  night-coverage [email or web address]

## 2021-11-10 NOTE — Progress Notes (Signed)
Patient lethargic and disoriented. RN in room and NP called to bedside.

## 2021-11-11 DIAGNOSIS — J9621 Acute and chronic respiratory failure with hypoxia: Secondary | ICD-10-CM | POA: Diagnosis not present

## 2021-11-11 DIAGNOSIS — J9622 Acute and chronic respiratory failure with hypercapnia: Secondary | ICD-10-CM | POA: Diagnosis not present

## 2021-11-11 LAB — SEDIMENTATION RATE: Sed Rate: 48 mm/hr — ABNORMAL HIGH (ref 0–20)

## 2021-11-11 LAB — C-REACTIVE PROTEIN: CRP: 4.2 mg/dL — ABNORMAL HIGH (ref <1.0)

## 2021-11-11 NOTE — Progress Notes (Addendum)
Patient continues to have ongoing anxiety and foot pain in the left more than the right.  Meds given per orders and elevated left extremity.  Patient is extremely anxious and is unable to sleep.  Provider aware.

## 2021-11-11 NOTE — Progress Notes (Signed)
Patient lethargic.  Adjusted HOB and sat patient up with pillow support on either side.  Neuro assessment completed.  Patient is slow to respond, and unable to follow commands.  Provider at bedside.  Placed on cardiac monitor.

## 2021-11-11 NOTE — Progress Notes (Signed)
Patient alert and able to reorient x 4.  Called sister to assist in calming the patient down and reorientation.  Night meds administered per order.

## 2021-11-11 NOTE — Progress Notes (Signed)
[employer] had become difficult to arouse . When fully awake, he did endose feeling tired. He denied shortness of breath, focal weakness, numbness or tingling  BBS with diminish sounds throughout and low lung volumes.  Patient refused ABG or further work evaluation Likely hypoventilation  Oxygen sats 93% with 60% oxygen via trach collar No focal neuro defictits  Continue to monitor

## 2021-11-11 NOTE — Progress Notes (Signed)
In PROGRESS NOTE    Gary [name]  VOJ:[id number] DOB: 10/22/1955  DOA: 09/19/2021 Date of Service: 11/11/21 PCP: [facility] Course:  66 y/o male with multiple medical problems causing chronic hypercapneic respiratory failure who has had admissions in the past for exacerbations of the same and mechanical ventilation presented for evaluation of dyspnea, weakness. It is unclear if the patient has been compliant with his medications and NIMV at home.  He was admitted by the hospitalists for a presumed COPD exacerbation in the ER, treated with bronchodilators and NIMV but his mental status and hypercarbia worsened despite those interventions so he required intubation.   6/6: Presented to ED.  Required intubation and mechanical ventilation in the ED.  PCCM admit ICU 6/7: Hold diuresis due to worsening Creatinine.  ENT consulted for Trach placement per family request 6/8: Pt continues to spike temps tmax 101.8 degrees F.  Flexiseal placed overnight draining foul smelling watery stool~Cdiff and GI panel pending  6/9: Fever resolved.  Negative for C- diff, GI panel still pending.  Creatinine slightly worsened, requiring low dose levophed (2 mcg). Holding diuresis 6/10: propofol discontinued due hypertriglyceridemia; failed SBT 6/11: Tracheal aspirate from 6/6 resulting with Pseudomonas, ABX changed to Cefepime. neurologically intact, awaiting Trach placement 6/12: Pt calmer today following addition of oral benzo's, narcotics, and Seroquel via tube.  Creatinine worsened with decreased UOP, holding diuresis, low threshold for Nephrology consult  6/13: Pt with worsening renal failure creatinine 4.15.  UOP overnight 25 ml.  Nephrology consulted plans to start pt on hemodialysis follow dialysis catheter placement Pt underwent hemodialysis without ultrafiltration 06/14: .  Plans for HD session today.  Agitation has improved significantly with narcotics and antianxiety medications per  tube   06/16: Tracheostomy canceled 06/15 due to hyperkalemia and tentatively rescheduled for 06/20  6/18 failure to wean from vent 06/20: ENT placed Tracheostomy (#7.0 XLT cuffed).  Developed mild epistaxis post unsuccessful attempt at NGT placement.  Later developed hematochezia/melena, Protonix gtt started and GI consulted. 06/21: Diuresed yesterday with limited results. Patient had softed blood pressures throughout the day and diuresis was held as a result. HD was performed by the patient clotted off the machine and was not able to be rinsed back. Repeat hemoglobin is above 8. 06/23: Pt remains mechanically intubated settings: FiO2 50%/PEEP 10.  CXR concerning for moderate right pleural effusion with associated right basilar atelectasis vs. infiltrate.  Will perform [place] Chest to determine if pt needs right sided thoracentesis  06/24: Remains on the ventilator, FiO2 requirements decreasing, respiratory culture with abundant gram-negative rods: [name], on Meropenem 06/25: Oxygen requirements down to 40%, chest x-ray shows improvement on dense right consolidation, Pseudomonas susceptibilities pending 06/25: Pt remains on the ventilator settings: PEEP 10 and FiO2 40%; will attempt to wean PEEP to 5 to perform SBT  06/28: Pt tolerated PS on 06/27.  Overnight pt had episodes of vomiting TF's suspect secondary to malposition of nasogastric tube.  Will consult IR for PEG tube placement if unable to place PEG tube pt will need postpyloric Dobbhoff   06/29: Pt tolerated PS 5/5 for over an hour yesterday, however due to fatigue placed back on PRVC.  Pt pulled NGT out several times overnight, therefore will not attempt to reinsert.  Plans for PEG tube placement per IR on 06/30.   06/30: PEG tube placed per IR  07/1: Pt tolerated trach collar _0 % throughout the day 07/2: Pt placed on ventilator overnight settings 16/5/35% tolerating well.  Will place pt on trach collar today and place orders for  PT/OT 7/3: Hemodynamically stable.  Tolerating TC (has remained on TC since 7/2 @ 08:00), plan for speech therapy and PT/OT 7/4: Has maintained on trach collar for greater than 48 hours, tolerating Passy-Muir valve.  Plan to transfer out of ICU. SLP recs: dysphagia 1 (puree) w/ nectar liquid.  7/5-7/6: sodium and creatinine trending appropriately, Hgb stable. Low K repleted and stable. Was off vent qhs (orders d/c or missed?) and was restarted. Will need LTAC, TOC following  7/7: pulled out trach overnight. Confusion re: vent plan among staff and myself. I asked PCCM to reevluate him and verify plan so the team and the patient can eb on the same page, and will help TOC arrange appropriate placement.  7/8: I spoke personally w/ Dr Milon Dikes [facility]) and he confirms patient does NOT need ventilator support, unless of course develops distress, but last night the patient tolerated well without it. Plan to meet w/ family today when they visit, I spoke w/ his sister yesterday on the phone. No concerns on CBC/BMP: Cr tending down a bit, Hgb stable, Na slightly high but improving over past few days. He is approaching medically stable and may be appropriate for LTAC vs SNF soon  7/9: Several long discussions with patient and family about medical condition, behavioral issues.  See separate progress notes.  Psychiatry evaluated, agree that he has capacity, started SSRI. 7/10: Patient remains medically stable, awaiting placement.  Discussed his behavioral issues again and encouraged him to always bring any concerns to nursing staff or to me calmly and I am happy to listen, requested he maintain professionalism towards staff, he seems amenable. 7/11-7/14: goal OOB to chair, needing hoyer lift, pt reeducated several times about staying in bed 07/11 and has been compliant through 07/14. Finally got bariatric chair and hoyer lift 7/12 and was OOB 07/13.  D/c monitoring and sitter and transferred to [place] status 07/13. Awaiting  placement.  7/15-17- working to reorient patient, but personality and coherence remain difficult. Stable for placement. Reminded patient not to verbally abuse staff 7/28 - rising O2 needs past couple of days, due to mucus plugging.  ABG stable, normal mentation.  Improved after suctioning.  Consultants:  PCCU Nephrology ENT GI IR Psychiatry  Procedures: 10/03/21: tracheostomy placement 10/12/21: dialysis permacath placement  10/13/21: G-tube placed  Subjective: Patient was sleeping comfortably when seen on rounds this morning.  He woke briefly denied any current acute complaints.  No acute events reported overnight.  Per RN, patient refusing to have labs drawn today.   ASSESSMENT & PLAN:   Principal Problem:   Acute on chronic respiratory failure with hypoxia and hypercapnia ([name]) Active Problems:   Major depressive disorder, recurrent episode, moderate ([name])   Acute on chronic diastolic congestive heart failure ([name])   COPD with acute exacerbation ([name])   Acute metabolic encephalopathy   HTN (hypertension)   Obesity, Class III, BMI 40-49.9 (morbid obesity) ([place])   Obstructive sleep apnea   Chronic kidney disease, stage 3a ([name])   HLD (hyperlipidemia)   Pressure injury of skin  Left foot blood blister - appreciate podiatry evaluation; underwent removal of blister bedside with podiatry on 7/21 - prevalon boots ordered to aid with pressure wounds going forward  Acute on Chronic Hypoxic Hypercapnic Respiratory Failure Acute exacerbation COPD Pseudomonal Pna OSA/OHS - S/p  tracheostomy by ENT by 6/20 - Trach collar w/ ventilator support as needed / qhs but he does NOT need vent  supoort overnight routinely per conversation w/ Dr Milon Dikes 07/08 - Completed 10 day course of ceftazidime for treatment of Pseudomonas - At this point, he is medically stable but placement is going to be an issue for him.  His behavioral issues have been discussed with him and his behavior has  improved  - Monitor mental status closely and check blood gas PRN - Has OHS and was reliant on nightly bipap prior to trach 7/28 - rising O2 needs and desatting episodes to 50's this AM.  CXR with no PNA, o nly mild pulmonary vascular congestion, BNP normal. No fevers or cough.  ABG today with PCO2 75 (near baseline for him) and patient mentating well.  Continue to monitor. --PCCM updated --Added Brovana nebs BID --Suction for mucus plugging PRN --Transferred to Progressive 7/29 for closer monitoring  7/29: stable on 10 L/min, no reports of significant de-sat episodes   Bilateral hand and foot pain - hx of gout, treated for a few days with colchicine last week.  Now having diffuse joint pains in hands and feel.   Pt and family ?rheumatoid arthritis.   Uric acid level was normal last week ESR 48, CRP 4.2 - nonspecific --Trial prednisone 40 mg x 2-3 days then taper --Follow up pending RF --Monitor for improvement  Acute decompensated HFpEF Hypertension - Echocardiogram 08/07/2021: LVEF 60 to 16%, grade 1 diastolic dysfunction, normal RV systolic function - Was on intermittent hemodialysis for volume removal  - has not needed HD and has remained compensated. Cancelled permacath placement  - resumed home torsemide 7/27   AKI superimposed CKD stage IIIa  Hypokalemia  Hypernatremia - During admissions in February 2023, creatinine 1.8 with GFR 40; in May 2023 creatinine 1.1 with GFR of 60 - Monitor I&O's / urinary output - Cr down, good UOP noted, dialysis catheter removed   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 - resolved  Pseudomonas Pneumonia  - Completed 10 day course of ceftazidime (started on 06/26) - C. Diff negative   Acute Blood Loss Anemia - resolved  Nasopharyngeal trauma vs Upper GI bleed - Continue BID IV PPI - per GI, no need for scope    Hyperglycemia - CBG's achs; patient refusing at times - SSI  Acute Metabolic Encephalopathy in setting of CO2  Narcosis Sedation needs in setting of mechanical ventilation - CT Head 09/19/21: negative;  UDS negative - Treatment of hypercapnia as outlined above   Trach in place Gtube in place - No longer requiring vent support - PO intake has improved, SLP and dietary following  -Continue trach collar supplemental O2 to maintain sats >88% - Continues to eat well; G-tube will have to remain in place at least 3 months prior to removal   DVT prophylaxis:lovenox Code Status: FULL Disposition Plan / TOC needs: to med-surg 07/13, awaiting placement  Barriers to discharge / significant pending items: remains on trach; behavioral issues  Objective: Vitals:   11/11/21 0900 11/11/21 0923 11/11/21 1200 11/11/21 1500  BP:  114/73 112/69 120/69  Pulse:   80 90  Resp: 19 (!) 34 (!) 22 16  Temp:  98.4 F (36.9 C)    TempSrc:  Axillary    SpO2:  (!) 89% (!) 86% 97%  Weight:      Height:        Intake/Output Summary (Last 24 hours) at 11/11/2021 1743 Last data filed at 11/11/2021 0900 Gross per 24 hour  Intake 1120 ml  Output 575 ml  Net 545 ml   [employer]  11/06/21 0500 11/08/21 0500 11/10/21 0500  Weight: (!) 190.5 kg (!) 191 kg (!) 185.1 kg   Physical Exam: General exam: sleeping comfortably, slightly more difficult to arouse but he responds and answers questions briefly, falls back to sleep quickly, no acute distress, morbidly obese HEENT: trach with PMV in place, trach collar Respiratory system: Lungs clear without wheezes or rhonchi, normal respiratory effort at rest, trach collar on 10 L/min supplemental O2  Cardiovascular system: normal S1/S2, RRR Gastrointestinal system: PEG present, soft non-tender, +bowel sounds. Central nervous system: no gross focal neurologic deficits, normal speech Extremities: no lower extremity edema, normal tone    Labs and data Reviewed:  CRP 4.2, ESR 48    LOS: 53 days     Ezekiel Slocumb, DO [facility] 11/11/2021, 5:43 PM    If  7PM-7AM, please contact night-coverage [email or web address]

## 2021-11-12 DIAGNOSIS — K921 Melena: Secondary | ICD-10-CM | POA: Diagnosis not present

## 2021-11-12 DIAGNOSIS — J9622 Acute and chronic respiratory failure with hypercapnia: Secondary | ICD-10-CM | POA: Diagnosis not present

## 2021-11-12 DIAGNOSIS — J9621 Acute and chronic respiratory failure with hypoxia: Secondary | ICD-10-CM | POA: Diagnosis not present

## 2021-11-12 LAB — BASIC METABOLIC PANEL
Anion gap: 6 (ref 5–15)
BUN: 40 mg/dL — ABNORMAL HIGH (ref 8–23)
CO2: 36 mmol/L — ABNORMAL HIGH (ref 22–32)
Calcium: 8.4 mg/dL — ABNORMAL LOW (ref 8.9–10.3)
Chloride: 98 mmol/L (ref 98–111)
Creatinine, Ser: 1.45 mg/dL — ABNORMAL HIGH (ref 0.61–1.24)
GFR, Estimated: 54 mL/min — ABNORMAL LOW (ref >60)
Glucose, Bld: 159 mg/dL — ABNORMAL HIGH (ref 70–99)
Potassium: 3.6 mmol/L (ref 3.5–5.1)
Sodium: 140 mmol/L (ref 135–145)

## 2021-11-12 LAB — RHEUMATOID FACTOR: Rheumatoid fact SerPl-aCnc: 58.8 IU/mL — ABNORMAL HIGH (ref <14.0)

## 2021-11-12 LAB — HEMOGLOBIN AND HEMATOCRIT, BLOOD
HCT: 40.7 % (ref 39.0–52.0)
Hemoglobin: 10.9 g/dL — ABNORMAL LOW (ref 13.0–17.0)

## 2021-11-12 NOTE — Assessment & Plan Note (Signed)
7/30 - notifiedby RN, pt had bloody BM, large amount of blood and clots were passed. Pt reports history of this for long time, unknown cause. Suspect hemorrhoids or diverticulosis. 8/1: no further reports and Hbg stable --Resume Lovenox VTE ppx --MonitorHbg --Monitor closely --GI consult

## 2021-11-12 NOTE — Progress Notes (Signed)
In PROGRESS NOTE    Gary [name]  WUJ:[id number] DOB: 1955-09-14  DOA: 09/19/2021 Date of Service: 11/12/21 PCP: [facility] Hospital Course:  66 y/o male with multiple medical problems causing chronic hypercapneic respiratory failure who has had admissions in the past for exacerbations of the same and mechanical ventilation presented for evaluation of dyspnea, weakness. It is unclear if the patient has been compliant with his medications and NIMV at home.  He was admitted by the hospitalists for a presumed COPD exacerbation in the ER, treated with bronchodilators and NIMV but his mental status and hypercarbia worsened despite those interventions so he required intubation.   6/6: Presented to ED.  Required intubation and mechanical ventilation in the ED.  PCCM admit ICU 6/7: Hold diuresis due to worsening Creatinine.  ENT consulted for Trach placement per family request 6/8: Pt continues to spike temps tmax 101.8 degrees F.  Flexiseal placed overnight draining foul smelling watery stool~Cdiff and GI panel pending  6/9: Fever resolved.  Negative for C- diff, GI panel still pending.  Creatinine slightly worsened, requiring low dose levophed (2 mcg). Holding diuresis 6/10: propofol discontinued due hypertriglyceridemia; failed SBT 6/11: Tracheal aspirate from 6/6 resulting with Pseudomonas, ABX changed to Cefepime. neurologically intact, awaiting Trach placement 6/12: Pt calmer today following addition of oral benzo's, narcotics, and Seroquel via tube.  Creatinine worsened with decreased UOP, holding diuresis, low threshold for Nephrology consult  6/13: Pt with worsening renal failure creatinine 4.15.  UOP overnight 25 ml.  Nephrology consulted plans to start pt on hemodialysis follow dialysis catheter placement Pt underwent hemodialysis without ultrafiltration 06/14: .  Plans for HD session today.  Agitation has improved significantly with narcotics and antianxiety medications per  tube   06/16: Tracheostomy canceled 06/15 due to hyperkalemia and tentatively rescheduled for 06/20  6/18 failure to wean from vent 06/20: ENT placed Tracheostomy (#7.0 XLT cuffed).  Developed mild epistaxis post unsuccessful attempt at NGT placement.  Later developed hematochezia/melena, Protonix gtt started and GI consulted. 06/21: Diuresed yesterday with limited results. Patient had softed blood pressures throughout the day and diuresis was held as a result. HD was performed by the patient clotted off the machine and was not able to be rinsed back. Repeat hemoglobin is above 8. 06/23: Pt remains mechanically intubated settings: FiO2 50%/PEEP 10.  CXR concerning for moderate right pleural effusion with associated right basilar atelectasis vs. infiltrate.  Will perform [place] Chest to determine if pt needs right sided thoracentesis  06/24: Remains on the ventilator, FiO2 requirements decreasing, respiratory culture with abundant gram-negative rods: [name], on Meropenem 06/25: Oxygen requirements down to 40%, chest x-ray shows improvement on dense right consolidation, Pseudomonas susceptibilities pending 06/25: Pt remains on the ventilator settings: PEEP 10 and FiO2 40%; will attempt to wean PEEP to 5 to perform SBT  06/28: Pt tolerated PS on 06/27.  Overnight pt had episodes of vomiting TF's suspect secondary to malposition of nasogastric tube.  Will consult IR for PEG tube placement if unable to place PEG tube pt will need postpyloric Dobbhoff   06/29: Pt tolerated PS 5/5 for over an hour yesterday, however due to fatigue placed back on PRVC.  Pt pulled NGT out several times overnight, therefore will not attempt to reinsert.  Plans for PEG tube placement per IR on 06/30.   06/30: PEG tube placed per IR  07/1: Pt tolerated trach collar _0 % throughout the day 07/2: Pt placed on ventilator overnight settings 16/5/35% tolerating well.  Will place pt on trach collar today and place orders for  PT/OT 7/3: Hemodynamically stable.  Tolerating TC (has remained on TC since 7/2 @ 08:00), plan for speech therapy and PT/OT 7/4: Has maintained on trach collar for greater than 48 hours, tolerating Passy-Muir valve.  Plan to transfer out of ICU. SLP recs: dysphagia 1 (puree) w/ nectar liquid.  7/5-7/6: sodium and creatinine trending appropriately, Hgb stable. Low K repleted and stable. Was off vent qhs (orders d/c or missed?) and was restarted. Will need LTAC, TOC following  7/7: pulled out trach overnight. Confusion re: vent plan among staff and myself. I asked PCCM to reevluate him and verify plan so the team and the patient can eb on the same page, and will help TOC arrange appropriate placement.  7/8: I spoke personally w/ Dr Milon Dikes [facility]) and he confirms patient does NOT need ventilator support, unless of course develops distress, but last night the patient tolerated well without it. Plan to meet w/ family today when they visit, I spoke w/ his sister yesterday on the phone. No concerns on CBC/BMP: Cr tending down a bit, Hgb stable, Na slightly high but improving over past few days. He is approaching medically stable and may be appropriate for LTAC vs SNF soon  7/9: Several long discussions with patient and family about medical condition, behavioral issues.  See separate progress notes.  Psychiatry evaluated, agree that he has capacity, started SSRI. 7/10: Patient remains medically stable, awaiting placement.  Discussed his behavioral issues again and encouraged him to always bring any concerns to nursing staff or to me calmly and I am happy to listen, requested he maintain professionalism towards staff, he seems amenable. 7/11-7/14: goal OOB to chair, needing hoyer lift, pt reeducated several times about staying in bed 07/11 and has been compliant through 07/14. Finally got bariatric chair and hoyer lift 7/12 and was OOB 07/13.  D/c monitoring and sitter and transferred to [place] status 07/13. Awaiting  placement.  7/15-17- working to reorient patient, but personality and coherence remain difficult. Stable for placement. Reminded patient not to verbally abuse staff 7/28 - rising O2 needs past couple of days, due to mucus plugging.  ABG stable, normal mentation.  Improved after suctioning.  Consultants:  PCCU Nephrology ENT GI IR Psychiatry  Procedures: 10/03/21: tracheostomy placement 10/12/21: dialysis permacath placement  10/13/21: G-tube placed  Subjective: Patient was sleeping comfortably when seen on rounds this morning.  He woke briefly denied any current acute complaints.  No acute events reported overnight.  Per RN, patient refusing to have labs drawn today.   ASSESSMENT & PLAN:   Principal Problem:   Acute on chronic respiratory failure with hypoxia and hypercapnia ([name]) Active Problems:   Major depressive disorder, recurrent episode, moderate ([name])   Bloody stool   COPD with acute exacerbation ([name])   Acute metabolic encephalopathy   HTN (hypertension)   Acute on chronic diastolic congestive heart failure ([name])   Obesity, Class III, BMI 40-49.9 (morbid obesity) ([place])   Obstructive sleep apnea   Chronic kidney disease, stage 3a ([name])   HLD (hyperlipidemia)   Pressure injury of skin  Left foot blood blister - appreciate podiatry evaluation; underwent removal of blister bedside with podiatry on 7/21 - prevalon boots ordered to aid with pressure wounds going forward  Acute on Chronic Hypoxic Hypercapnic Respiratory Failure Acute exacerbation COPD Pseudomonal Pna OSA/OHS - S/p  tracheostomy by ENT by 6/20 - Trach collar w/ ventilator support as needed / qhs but he  does NOT need vent supoort overnight routinely per conversation w/ Dr Milon Dikes 07/08 - Completed 10 day course of ceftazidime for treatment of Pseudomonas - At this point, he is medically stable but placement is going to be an issue for him.  His behavioral issues have been discussed with him and his  behavior has improved  - Monitor mental status closely and check blood gas PRN - Has OHS and was reliant on nightly bipap prior to trach 7/28 - rising O2 needs and desatting episodes to 50's this AM.  CXR with no PNA, o nly mild pulmonary vascular congestion, BNP normal. No fevers or cough.  ABG today with PCO2 75 (near baseline for him) and patient mentating well.  Continue to monitor. --PCCM updated --Added Brovana nebs BID --Suction for mucus plugging PRN --Transferred to Progressive 7/29 for closer monitoring  7/29: stable on 10 L/min, no reports of significant de-sat episodes   Blood stool - new 7/30 - notified this afternoon by RN, pt had bloody BM, large amount of blood and clots were passed. --Check Hbg --Stop Lovenox for now --Monitor closely & consider GI consult  Hbg is up >1 pt from prior, monitor  Bilateral hand and foot pain - hx of gout, treated for a few days with colchicine last week.  Now having diffuse joint pains in hands and feel.   Pt and family ?rheumatoid arthritis.   Uric acid level was normal last week ESR 48, CRP 4.2 - nonspecific Rheumatoid factor elevated 58.8 --Continue prednisone, reduce to 20 mg  --Monitor for improvement  Acute decompensated HFpEF Hypertension - Echocardiogram 08/07/2021: LVEF 60 to 63%, grade 1 diastolic dysfunction, normal RV systolic function - Was on intermittent hemodialysis for volume removal  - has not needed HD and has remained compensated. Cancelled permacath placement  - resumed home torsemide 7/27   AKI superimposed CKD stage IIIa  Hypokalemia  Hypernatremia - During admissions in February 2023, creatinine 1.8 with GFR 40; in May 2023 creatinine 1.1 with GFR of 60 - Monitor I&O's / urinary output - Cr down, good UOP noted, dialysis catheter removed 7/30: Cr up to 1.45, from 1.17 on 7/27.   - Encourage PO hydration/intake - Hold off fluids for now - Repeat BMP tomorrow   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  - resolved  Pseudomonas Pneumonia  - Completed 10 day course of ceftazidime (started on 06/26) - C. Diff negative   Acute Blood Loss Anemia - resolved  Nasopharyngeal trauma vs Upper GI bleed - Continue BID IV PPI - per GI, no need for scope    Hyperglycemia - CBG's achs; patient refusing at times - SSI  Acute Metabolic Encephalopathy in setting of CO2 Narcosis Sedation needs in setting of mechanical ventilation - CT Head 09/19/21: negative;  UDS negative - Treatment of hypercapnia as outlined above   Trach in place Gtube in place - No longer requiring vent support - PO intake has improved, SLP and dietary following  -Continue trach collar supplemental O2 to maintain sats >88% - Continues to eat well; G-tube will have to remain in place at least 3 months prior to removal   DVT prophylaxis:lovenox Code Status: FULL Disposition Plan / TOC needs: to med-surg 07/13, awaiting SNF placement  Barriers to discharge / significant pending items: remains on trach; behavioral issues  Objective: Vitals:   11/12/21 0727 11/12/21 0836 11/12/21 1155 11/12/21 1700  BP:  121/85 102/73 111/84  Pulse:  85 85 88  Resp:  14 (!) 22 19  Temp:  98.5 F (36.9 C) 98.3 F (36.8 C) 98.7 F (37.1 C)  TempSrc:  Oral Oral   SpO2: 95% 99% 92% 92%  Weight:      Height:        Intake/Output Summary (Last 24 hours) at 11/12/2021 1751 Last data filed at 11/12/2021 1400 Gross per 24 hour  Intake 240 ml  Output 1500 ml  Net -1260 ml   Filed Weights   11/06/21 0500 11/08/21 0500 11/10/21 0500  Weight: (!) 190.5 kg (!) 191 kg (!) 185.1 kg   Physical Exam: General exam: sitting up in bed eating breakfast, awake & alert, no acute distress, morbidly obese HEENT: trach with PMV in place, trach collar Respiratory system: Lungs clear without wheezes or rhonchi, normal respiratory effort at rest, trach collar on 10 L/min supplemental O2  Cardiovascular system: normal S1/S2, RRR Gastrointestinal system:  PEG present, soft non-tender, +bowel sounds. Central nervous system: no gross focal neurologic deficits, normal speech Extremities: no lower extremity edema, normal tone    Labs and data Reviewed:  BUN 36, Cr 1.45, BUN 40, Ca 8.4, GFR 54, Hbg 10.9 from 9.8.  Rheumatoid factor elevated 58.8.      LOS: 32 days     Ezekiel Slocumb, DO [facility] 11/12/2021, 5:51 PM    If 7PM-7AM, please contact night-coverage [email or web address]

## 2021-11-12 NOTE — Progress Notes (Signed)
Reminded patient to remove passy muir valve when going to sleep, patient is refusing and oxygen levels dip to 82%.  Will encourage compliance.

## 2021-11-12 NOTE — Progress Notes (Signed)
Removed passy with patient permission.  Patient refusing vitals at this time

## 2021-11-13 DIAGNOSIS — J9621 Acute and chronic respiratory failure with hypoxia: Secondary | ICD-10-CM | POA: Diagnosis not present

## 2021-11-13 DIAGNOSIS — J9622 Acute and chronic respiratory failure with hypercapnia: Secondary | ICD-10-CM | POA: Diagnosis not present

## 2021-11-13 LAB — BASIC METABOLIC PANEL
Anion gap: 9 (ref 5–15)
BUN: 39 mg/dL — ABNORMAL HIGH (ref 8–23)
CO2: 37 mmol/L — ABNORMAL HIGH (ref 22–32)
Calcium: 8.5 mg/dL — ABNORMAL LOW (ref 8.9–10.3)
Chloride: 96 mmol/L — ABNORMAL LOW (ref 98–111)
Creatinine, Ser: 1.29 mg/dL — ABNORMAL HIGH (ref 0.61–1.24)
GFR, Estimated: >60 mL/min (ref >60)
Glucose, Bld: 101 mg/dL — ABNORMAL HIGH (ref 70–99)
Potassium: 4 mmol/L (ref 3.5–5.1)
Sodium: 142 mmol/L (ref 135–145)

## 2021-11-13 LAB — CBC
HCT: 36.7 % — ABNORMAL LOW (ref 39.0–52.0)
Hemoglobin: 9.9 g/dL — ABNORMAL LOW (ref 13.0–17.0)
MCH: 22.2 pg — ABNORMAL LOW (ref 26.0–34.0)
MCHC: 27 g/dL — ABNORMAL LOW (ref 30.0–36.0)
MCV: 82.5 fL (ref 80.0–100.0)
Platelets: 169 10*3/uL (ref 150–400)
RBC: 4.45 MIL/uL (ref 4.22–5.81)
RDW: 16.7 % — ABNORMAL HIGH (ref 11.5–15.5)
WBC: 7.3 10*3/uL (ref 4.0–10.5)
nRBC: 0 % (ref 0.0–0.2)

## 2021-11-13 MED ORDER — PREDNISONE 20 MG PO TABS
20.0000 mg | ORAL_TABLET | Freq: Every day | ORAL | Status: DC
  Administered 2021-11-14 – 2021-11-15 (×2): 20 mg via ORAL
  Filled 2021-11-13 (×2): qty 1

## 2021-11-13 NOTE — Plan of Care (Signed)
  Problem: Education: Goal: Ability to demonstrate management of disease process will improve Outcome: Progressing Goal: Ability to verbalize understanding of medication therapies will improve Outcome: Progressing   Problem: Activity: Goal: Capacity to carry out activities will improve Outcome: Progressing   Problem: Cardiac: Goal: Ability to achieve and maintain adequate cardiopulmonary perfusion will improve Outcome: Progressing   Problem: Education: Goal: Knowledge of disease or condition will improve Outcome: Progressing Goal: Knowledge of the prescribed therapeutic regimen will improve Outcome: Progressing   Problem: Activity: Goal: Ability to tolerate increased activity will improve Outcome: Progressing Goal: Will verbalize the importance of balancing activity with adequate rest periods Outcome: Progressing   Problem: Respiratory: Goal: Ability to maintain a clear airway will improve Outcome: Progressing Goal: Levels of oxygenation will improve Outcome: Progressing Goal: Ability to maintain adequate ventilation will improve Outcome: Progressing   Problem: Education: Goal: Knowledge of General Education information will improve Description: Including pain rating scale, medication(s)/side effects and non-pharmacologic comfort measures Outcome: Progressing   Problem: Health Behavior/Discharge Planning: Goal: Ability to manage health-related needs will improve Outcome: Progressing   Problem: Clinical Measurements: Goal: Ability to maintain clinical measurements within normal limits will improve Outcome: Progressing Goal: Will remain free from infection Outcome: Progressing Goal: Diagnostic test results will improve Outcome: Progressing Goal: Respiratory complications will improve Outcome: Progressing Goal: Cardiovascular complication will be avoided Outcome: Progressing   Problem: Activity: Goal: Risk for activity intolerance will decrease Outcome:  Progressing   Problem: Nutrition: Goal: Adequate nutrition will be maintained Outcome: Progressing   Problem: Coping: Goal: Level of anxiety will decrease Outcome: Progressing   Problem: Elimination: Goal: Will not experience complications related to bowel motility Outcome: Progressing Goal: Will not experience complications related to urinary retention Outcome: Progressing   Problem: Pain Managment: Goal: General experience of comfort will improve Outcome: Progressing   Problem: Safety: Goal: Ability to remain free from injury will improve Outcome: Progressing   Problem: Skin Integrity: Goal: Risk for impaired skin integrity will decrease Outcome: Progressing   Problem: Education: Goal: Ability to describe self-care measures that may prevent or decrease complications (Diabetes Survival Skills Education) will improve Outcome: Progressing   Problem: Coping: Goal: Ability to adjust to condition or change in health will improve Outcome: Progressing   Problem: Fluid Volume: Goal: Ability to maintain a balanced intake and output will improve Outcome: Progressing   Problem: Health Behavior/Discharge Planning: Goal: Ability to identify and utilize available resources and services will improve Outcome: Progressing Goal: Ability to manage health-related needs will improve Outcome: Progressing   Problem: Metabolic: Goal: Ability to maintain appropriate glucose levels will improve Outcome: Progressing   Problem: Nutritional: Goal: Maintenance of adequate nutrition will improve Outcome: Progressing Goal: Progress toward achieving an optimal weight will improve Outcome: Progressing   Problem: Skin Integrity: Goal: Risk for impaired skin integrity will decrease Outcome: Progressing   Problem: Tissue Perfusion: Goal: Adequacy of tissue perfusion will improve Outcome: Progressing

## 2021-11-13 NOTE — Progress Notes (Signed)
PT Cancellation Note  Patient Details Name: Gary [name] MRN: [id number] DOB: 1955/04/30   Cancelled Treatment:    Reason Eval/Treat Not Completed: Patient declined, no reason specified Attempted to see pt for PT tx with pt received asleep in bed but easily awakened. Pt reports "[name]. You might as well go on back where you came from." PT educates pt on benefits of therapy & attempts to ask pt what his personal goals are but pt doesn't answer. Pt left upright in bed set up with lunch tray.  Lavone Nian, PT, DPT 11/13/21, 1:30 PM   [name] 11/13/2021, 1:29 PM

## 2021-11-13 NOTE — Progress Notes (Signed)
In PROGRESS NOTE    Gary [name]  FXT:[id number] DOB: May 06, 1955  DOA: 09/19/2021 Date of Service: 11/13/21 [facility]: [facility] Course:  66 y/o male with multiple medical problems causing chronic hypercapneic respiratory failure who has had admissions in the past for exacerbations of the same and mechanical ventilation presented for evaluation of dyspnea, weakness. It is unclear if the patient has been compliant with his medications and NIMV at home.  He was admitted by the hospitalists for a presumed COPD exacerbation in the ER, treated with bronchodilators and NIMV but his mental status and hypercarbia worsened despite those interventions so he required intubation.   6/6: Presented to ED.  Required intubation and mechanical ventilation in the ED.  PCCM admit ICU 6/7: Hold diuresis due to worsening Creatinine.  ENT consulted for Trach placement per family request 6/8: Pt continues to spike temps tmax 101.8 degrees F.  Flexiseal placed overnight draining foul smelling watery stool~Cdiff and GI panel pending  6/9: Fever resolved.  Negative for C- diff, GI panel still pending.  Creatinine slightly worsened, requiring low dose levophed (2 mcg). Holding diuresis 6/10: propofol discontinued due hypertriglyceridemia; failed SBT 6/11: Tracheal aspirate from 6/6 resulting with Pseudomonas, ABX changed to Cefepime. neurologically intact, awaiting Trach placement 6/12: Pt calmer today following addition of oral benzo's, narcotics, and Seroquel via tube.  Creatinine worsened with decreased UOP, holding diuresis, low threshold for Nephrology consult  6/13: Pt with worsening renal failure creatinine 4.15.  UOP overnight 25 ml.  Nephrology consulted plans to start pt on hemodialysis follow dialysis catheter placement Pt underwent hemodialysis without ultrafiltration 06/14: .  Plans for HD session today.  Agitation has improved significantly with narcotics and antianxiety medications per  tube   06/16: Tracheostomy canceled 06/15 due to hyperkalemia and tentatively rescheduled for 06/20  6/18 failure to wean from vent 06/20: ENT placed Tracheostomy (#7.0 XLT cuffed).  Developed mild epistaxis post unsuccessful attempt at NGT placement.  Later developed hematochezia/melena, Protonix gtt started and GI consulted. 06/21: Diuresed yesterday with limited results. Patient had softed blood pressures throughout the day and diuresis was held as a result. HD was performed by the patient clotted off the machine and was not able to be rinsed back. Repeat hemoglobin is above 8. 06/23: Pt remains mechanically intubated settings: FiO2 50%/PEEP 10.  CXR concerning for moderate right pleural effusion with associated right basilar atelectasis vs. infiltrate.  Will perform [place] Chest to determine if pt needs right sided thoracentesis  06/24: Remains on the ventilator, FiO2 requirements decreasing, respiratory culture with abundant gram-negative rods: [name], on Meropenem 06/25: Oxygen requirements down to 40%, chest x-ray shows improvement on dense right consolidation, Pseudomonas susceptibilities pending 06/25: Pt remains on the ventilator settings: PEEP 10 and FiO2 40%; will attempt to wean PEEP to 5 to perform SBT  06/28: Pt tolerated PS on 06/27.  Overnight pt had episodes of vomiting TF's suspect secondary to malposition of nasogastric tube.  Will consult IR for PEG tube placement if unable to place PEG tube pt will need postpyloric Dobbhoff   06/29: Pt tolerated PS 5/5 for over an hour yesterday, however due to fatigue placed back on PRVC.  Pt pulled NGT out several times overnight, therefore will not attempt to reinsert.  Plans for PEG tube placement per IR on 06/30.   06/30: PEG tube placed per IR  07/1: Pt tolerated trach collar _0 % throughout the day 07/2: Pt placed on ventilator overnight settings 16/5/35% tolerating well.  Will place pt on trach collar today and place orders for  PT/OT 7/3: Hemodynamically stable.  Tolerating TC (has remained on TC since 7/2 @ 08:00), plan for speech therapy and PT/OT 7/4: Has maintained on trach collar for greater than 48 hours, tolerating Passy-Muir valve.  Plan to transfer out of ICU. SLP recs: dysphagia 1 (puree) w/ nectar liquid.  7/5-7/6: sodium and creatinine trending appropriately, Hgb stable. Low K repleted and stable. Was off vent qhs (orders d/c or missed?) and was restarted. Will need LTAC, TOC following  7/7: pulled out trach overnight. Confusion re: vent plan among staff and myself. I asked PCCM to reevluate him and verify plan so the team and the patient can eb on the same page, and will help TOC arrange appropriate placement.  7/8: I spoke personally w/ Dr Milon Dikes [facility]) and he confirms patient does NOT need ventilator support, unless of course develops distress, but last night the patient tolerated well without it. Plan to meet w/ family today when they visit, I spoke w/ his sister yesterday on the phone. No concerns on CBC/BMP: Cr tending down a bit, Hgb stable, Na slightly high but improving over past few days. He is approaching medically stable and may be appropriate for LTAC vs SNF soon  7/9: Several long discussions with patient and family about medical condition, behavioral issues.  See separate progress notes.  Psychiatry evaluated, agree that he has capacity, started SSRI. 7/10: Patient remains medically stable, awaiting placement.  Discussed his behavioral issues again and encouraged him to always bring any concerns to nursing staff or to me calmly and I am happy to listen, requested he maintain professionalism towards staff, he seems amenable. 7/11-7/14: goal OOB to chair, needing hoyer lift, pt reeducated several times about staying in bed 07/11 and has been compliant through 07/14. Finally got bariatric chair and hoyer lift 7/12 and was OOB 07/13.  D/c monitoring and sitter and transferred to [place] status 07/13. Awaiting  placement.  7/15-17- working to reorient patient, but personality and coherence remain difficult. Stable for placement. Reminded patient not to verbally abuse staff 7/28 - rising O2 needs past couple of days, due to mucus plugging.  ABG stable, normal mentation.  Improved after suctioning.  Consultants:  PCCU Nephrology ENT GI IR Psychiatry  Procedures: 10/03/21: tracheostomy placement 10/12/21: dialysis permacath placement  10/13/21: G-tube placed  Subjective: Patient was sleeping comfortably when seen on rounds this morning.  He woke briefly denied any current acute complaints.  No acute events reported overnight.  Per RN, patient refusing to have labs drawn today.   ASSESSMENT & PLAN:   Principal Problem:   Acute on chronic respiratory failure with hypoxia and hypercapnia ([name]) Active Problems:   Major depressive disorder, recurrent episode, moderate ([name])   Bloody stool   COPD with acute exacerbation ([name])   Acute metabolic encephalopathy   HTN (hypertension)   Acute on chronic diastolic congestive heart failure ([name])   Obesity, Class III, BMI 40-49.9 (morbid obesity) ([place])   Obstructive sleep apnea   Chronic kidney disease, stage 3a ([name])   HLD (hyperlipidemia)   Pressure injury of skin  Left foot blood blister - appreciate podiatry evaluation; underwent removal of blister bedside with podiatry on 7/21 - prevalon boots ordered to aid with pressure wounds going forward  Acute on Chronic Hypoxic Hypercapnic Respiratory Failure Acute exacerbation COPD Pseudomonal Pna OSA/OHS - S/p  tracheostomy by ENT by 6/20 - Trach collar w/ ventilator support as needed / qhs but he  does NOT need vent supoort overnight routinely per conversation w/ Dr Milon Dikes 07/08 - Completed 10 day course of ceftazidime for treatment of Pseudomonas - At this point, he is medically stable but placement is going to be an issue for him.  His behavioral issues have been discussed with him and his  behavior has improved  - Monitor mental status closely and check blood gas PRN - Has OHS and was reliant on nightly bipap prior to trach 7/28 - rising O2 needs and desatting episodes to 50's this AM.  CXR with no PNA, o nly mild pulmonary vascular congestion, BNP normal. No fevers or cough.  ABG today with PCO2 75 (near baseline for him) and patient mentating well.  Continue to monitor. --PCCM updated --Added Brovana nebs BID --Suction for mucus plugging PRN --Transferred to Progressive 7/29 for closer monitoring  7/29-31: stable on 10 L/min, no reports of significant de-sat episodes   Blood stool - new 7/30 - notified this afternoon by RN, pt had bloody BM, large amount of blood and clots were passed.  Pt reports long history of same, intermittently, but is unsure if it's been evaluated or what the cause is (unsure if hemorrhoid or diverticulosis). --Check Hbg --Stop Lovenox for now --Monitor closely & consider GI consult    Bilateral hand and foot pain - hx of gout, treated for a few days with colchicine last week.  Now having diffuse joint pains in hands and feel.   Pt and family ?rheumatoid arthritis.   Uric acid level was normal last week ESR 48, CRP 4.2 - nonspecific Rheumatoid factor elevated 58.8 --Check anti-CCP --Continue prednisone, reduce to 20 mg  --Monitor for improvement  Acute decompensated HFpEF Hypertension - Echocardiogram 08/07/2021: LVEF 60 to 30%, grade 1 diastolic dysfunction, normal RV systolic function - Was on intermittent hemodialysis for volume removal  - has not needed HD and has remained compensated. Cancelled permacath placement  - resumed home torsemide 7/27   AKI superimposed CKD stage IIIa  Hypokalemia  Hypernatremia - During admissions in February 2023, creatinine 1.8 with GFR 40; in May 2023 creatinine 1.1 with GFR of 60 - Monitor I&O's / urinary output - Cr down, good UOP noted, dialysis catheter removed 7/30: Cr up to 1.45, from 1.17 on  7/27.   7/31: Cr improved 1.29 - Encourage PO hydration/intake - Hold off fluids for now - Repeat BMP tomorrow   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 - resolved  Pseudomonas Pneumonia  - Completed 10 day course of ceftazidime (started on 06/26) - C. Diff negative   Acute Blood Loss Anemia - resolved  Nasopharyngeal trauma vs Upper GI bleed - Continue BID IV PPI - per GI, no need for scope    Hyperglycemia - CBG's achs; patient refusing at times - SSI  Acute Metabolic Encephalopathy in setting of CO2 Narcosis Sedation needs in setting of mechanical ventilation - CT Head 09/19/21: negative;  UDS negative - Treatment of hypercapnia as outlined above   Trach in place Gtube in place - No longer requiring vent support - PO intake has improved, SLP and dietary following  -Continue trach collar supplemental O2 to maintain sats >88% - Continues to eat well; G-tube will have to remain in place at least 3 months prior to removal   DVT prophylaxis:lovenox Code Status: FULL Disposition Plan / TOC needs: to med-surg 07/13, awaiting SNF placement  Barriers to discharge / significant pending items: remains on trach; behavioral issues  Objective: Vitals:   11/13/21 1114 11/13/21  1200 11/13/21 1605 11/13/21 1623  BP:  110/75  (!) 97/50  Pulse: 88 87 87 88  Resp: _0 Temp:  98 F (36.7 C)  98.2 F (36.8 C)  TempSrc:  Oral  Oral  SpO2: 95% 90% 95% 97%  Weight:      Height:        Intake/Output Summary (Last 24 hours) at 11/13/2021 1825 Last data filed at 11/13/2021 1700 Gross per 24 hour  Intake 660 ml  Output 1150 ml  Net -490 ml   Filed Weights   11/08/21 0500 11/10/21 0500 11/13/21 0500  Weight: (!) 191 kg (!) 185.1 kg (!) 187.8 kg   Physical Exam:  General exam: sitting up in bed eating lunch, awake & alert, no acute distress, morbidly obese HEENT: trach with PMV in place, trach collar Respiratory system: normal respiratory effort at rest, trach  collar on 10 L/min supplemental O2  Cardiovascular system: normal S1/S2, RRR Gastrointestinal system: PEG present, soft non-tender, +bowel sounds. Central nervous system: no gross focal neurologic deficits, normal speech Extremities: no lower extremity edema, normal tone    Labs and data Reviewed:   Cl 96, CO2 37, BUN 39, Cr 1.29 from 1.45, Ca 8.5, Hbg 9.9 from 10.9    LOS: 55 days     Ezekiel Slocumb, DO [facility] 11/13/2021, 6:25 PM    If 7PM-7AM, please contact night-coverage [email or web address]

## 2021-11-13 NOTE — TOC Progression Note (Signed)
Transition of Care [facility]) - Progression Note    Patient Details  Name: Gary [name] MRN: [id number] Date of Birth: 12/31/1955  Transition of Care [facility]) CM/SW [place], LCSW Phone Number: 11/13/2021, 9:04 AM  Clinical Narrative:   No bed offers this morning.  Expected Discharge Plan: Long Term Acute Care (LTAC) Barriers to Discharge: Continued Medical Work up  Expected Discharge Plan and Services Expected Discharge Plan: Long Term Acute Care (LTAC)   Discharge Planning Services: CM Consult Post Acute Care Choice: Long Term Acute Care (LTAC) Living arrangements for the past 2 months: Single Family Home                 DME Arranged: N/A DME Agency: NA       HH Arranged: NA HH Agency: NA         Social Determinants of Health (SDOH) Interventions    Readmission Risk Interventions    05/04/2021    1:26 PM 04/07/2021    1:39 PM 03/12/2021   11:54 AM  Readmission Risk Prevention Plan  Transportation Screening Complete Complete Complete  PCP or Specialist Appt within 3-5 Days  Complete Complete  HRI or [place]   Complete  Social Work Consult for [place] Planning/Counseling   Complete  Palliative Care Screening  Not Applicable Not Applicable  Medication Review [employer] photographer) Complete Complete Complete  PCP or Specialist appointment within 3-5 days of discharge Complete    HRI or [place] Complete    SW Recovery Care/Counseling Consult Complete    [place] Not Applicable

## 2021-11-13 NOTE — Progress Notes (Signed)
Occupational Therapy Treatment Patient Details Name: Gary [name] MRN: [id number] DOB: 14-Jun-1955 Today's Date: 11/13/2021   History of present illness Patient was admitted secondary to SOB, COPD exacerbation, acute metabolic encephalopathy, and acute on chronic hypoxic and hypercapnic respiratory failure in setting of acute decompensated HFpEF and AECOPD requiring intubation and mechanical ventilation. The pt is now s/p tracheostomy and PEG tube placement 11/05/21.   OT comments  Gary [name] did well today, was eager to participate in therapy, and made good progress. He was able to roll in bed, using UE to pull on contralateral bed rails, and was able to grab headboard and reposition himself towards HOB in supine w/o any physical assistance from therapist. He also performed UB grooming and dressing w/ little to no physical assist, still needs Max A for LB washing and dressing. Pt stated he wanted to get OOB and transfer to recliner. He comes to EOB sitting w/ SUPV/CGA and maintained good sitting balance for >10 minutes. Attempted standing but only able to maintain balance in standing for ~ 60 seconds before returning to sitting, saying his knees hurt with standing. He stated he would continue to work on bed mobility and bed-level exercises on his own today and would like to re-attempt transfer to recliner tomorrow. Pt stated he was pleased with the level of activity he had been able to engage in today. Goals of care updated; PoC and DC recs remain appropriate.     Recommendations for follow up therapy are one component of a multi-disciplinary discharge planning process, led by the attending physician.  Recommendations may be updated based on patient status, additional functional criteria and insurance authorization.    Follow Up Recommendations  Skilled nursing-short term rehab (<3 hours/day)    Assistance Recommended at Discharge Frequent or constant Supervision/Assistance  Patient can return home  with the following  Two people to help with walking and/or transfers;Assistance with cooking/housework;Direct supervision/assist for medications management;Assist for transportation;Two people to help with bathing/dressing/bathroom   Equipment Recommendations       Recommendations for Other Services      Precautions / Restrictions Precautions Precautions: Fall Precaution Comments: IJ, G tube, trach Restrictions Weight Bearing Restrictions: No Other Position/Activity Restrictions: Paring of blood blister on LLE 11/03/21; Per Dr Luana Shu no weight bearing restrictions on LLE       Mobility Bed Mobility Overal bed mobility: Needs Assistance Bed Mobility: Rolling, Supine to Sit, Sit to Supine Rolling: Min assist   Supine to sit: HOB elevated, Min guard Sit to supine: Mod assist   General bed mobility comments: Pt able to roll in bed and come up into sitting w/o physical assistance, required mod A for elevating LE for sit<supine. Able to use UE to reposition self towards HOB in supine, with SUPV    Transfers Overall transfer level: Needs assistance Equipment used: Rolling walker (2 wheels) Transfers: Sit to/from Stand Sit to Stand: Min assist           General transfer comment: Comes up into standing with Min A and RW     Balance Overall balance assessment: Needs assistance Sitting-balance support: Feet supported, No upper extremity supported Sitting balance-[name] Scale: Good Sitting balance - Comments: good sitting balance EOB for >10 min   Standing balance support: Bilateral upper extremity supported, During functional activity, Reliant on assistive device for balance Standing balance-[name] Scale: Poor Standing balance comment: able to maintain standing balance <1 minute, c/o knee pain  ADL either performed or assessed with clinical judgement   ADL Overall ADL's : Needs assistance/impaired     Grooming: Wash/dry hands;Wash/dry  face;Bed level;Maximal assistance;Supervision/safety Grooming Details (indicate cue type and reason): Pt able to wash, apply lotion to UE with SUPV, set; Max A for attending to LB grooming         Upper Body Dressing : Minimal assistance;Bed level   Lower Body Dressing: Maximal assistance;Sitting/lateral leans Lower Body Dressing Details (indicate cue type and reason): donning socks                    Extremity/Trunk Assessment Upper Extremity Assessment Upper Extremity Assessment: Overall WFL for tasks assessed   Lower Extremity Assessment Lower Extremity Assessment: Generalized weakness        Vision       Perception     Praxis      Cognition Arousal/Alertness: Awake/alert Behavior During Therapy: WFL for tasks assessed/performed Overall Cognitive Status: Within Functional Limits for tasks assessed                         Following Commands: Follows one step commands consistently       General Comments: slightly delayed processing, but eager to engage in therapy this date        Exercises Other Exercises Other Exercises: Educ re: repositioning in bed, standing technique, proper placement of air supply on trach collar    Shoulder Instructions       General Comments O2 sats drop into 80s if trach collar becomes misplaced; if properly placed, SpO2 in mid 90s    Pertinent Vitals/ Pain       Pain Assessment Faces Pain Scale: Hurts little more Pain Location: fingers and feet Pain Descriptors / Indicators: Aching, Grimacing, Discomfort Pain Intervention(s): Repositioned  Home Living                                          Prior Functioning/Environment              Frequency  Min 2X/week        Progress Toward Goals  OT Goals(current goals can now be found in the care plan section)  Progress towards OT goals: Progressing toward goals  Acute Rehab OT Goals Patient Stated Goal: to get back to walking  again OT Goal Formulation: With patient Time For Goal Achievement: 11/27/21 Potential to Achieve Goals: Good  Plan Discharge plan remains appropriate;Frequency remains appropriate    Co-evaluation                 AM-PAC OT "6 Clicks" Daily Activity     Outcome Measure   Help from another person eating meals?: A Little Help from another person taking care of personal grooming?: A Little Help from another person toileting, which includes using toliet, bedpan, or urinal?: A Lot Help from another person bathing (including washing, rinsing, drying)?: A Lot Help from another person to put on and taking off regular upper body clothing?: A Little Help from another person to put on and taking off regular lower body clothing?: A Lot 6 Click Score: 15    End of Session Equipment Utilized During Treatment: Oxygen;Other (comment)  OT Visit Diagnosis: Other abnormalities of gait and mobility (R26.89);Muscle weakness (generalized) (M62.81)   Activity Tolerance Patient tolerated treatment well   Patient Left in bed;with  call bell/phone within reach   Nurse Communication Mobility status        Time: 1442-1531 OT Time Calculation (min): 49 min  Charges: OT General Charges $OT Visit: 1 Visit OT Evaluation $OT Re-eval: 1 Re-eval OT Treatments $Self Care/Home Management : 38-52 mins  [name], PhD, MS, OTR/L 11/13/21, 4:06 PM

## 2021-11-13 NOTE — Progress Notes (Signed)
Pt noncompliant with medications and care. Morning rounds: RN checked on patient multiple times, pt sleeping. When it was time for medications, pt refused his nutritional supplements but wanted his pills. Pt contradicting everything RN does: adjusting bed for pt to take meds, flushing peg tube, etc, all while keeping his eyes closed. RN left room, 5 minutes later pt starts screaming out "hey" continuously. RN walked back in the room and asked why he was screaming and not using the call light, which he had next to him. All pt said was "Give me my tray". RN set pt up to eat breakfast. The screaming continued intermittently throughout the morning and early noon. Pt then using call light incessantly asking to be changed saying he has been soaked and asking for help for hours. Pt was told that wasn't true by RN and tech. Supplies and people were gathered to clean patient, but pt had fallen asleep. Just to be sure, RN checked and pt had an intact condom catheter and there was no BM on sheets. OT attempted to work with pt but he said he needed to be changed again. 2 techs went in to help and pt was clean but they helped out anyway. Pt is hard to please and very noncompliant with staff, but has a much better attitude with doctors. Charge and management all aware of situation.

## 2021-11-14 DIAGNOSIS — J9622 Acute and chronic respiratory failure with hypercapnia: Secondary | ICD-10-CM | POA: Diagnosis not present

## 2021-11-14 DIAGNOSIS — J9621 Acute and chronic respiratory failure with hypoxia: Secondary | ICD-10-CM | POA: Diagnosis not present

## 2021-11-14 LAB — BASIC METABOLIC PANEL
Anion gap: 8 (ref 5–15)
BUN: 37 mg/dL — ABNORMAL HIGH (ref 8–23)
CO2: 34 mmol/L — ABNORMAL HIGH (ref 22–32)
Calcium: 8.4 mg/dL — ABNORMAL LOW (ref 8.9–10.3)
Chloride: 97 mmol/L — ABNORMAL LOW (ref 98–111)
Creatinine, Ser: 1.33 mg/dL — ABNORMAL HIGH (ref 0.61–1.24)
GFR, Estimated: 60 mL/min — ABNORMAL LOW (ref >60)
Glucose, Bld: 122 mg/dL — ABNORMAL HIGH (ref 70–99)
Potassium: 4.2 mmol/L (ref 3.5–5.1)
Sodium: 139 mmol/L (ref 135–145)

## 2021-11-14 LAB — HEMOGLOBIN AND HEMATOCRIT, BLOOD
HCT: 36.8 % — ABNORMAL LOW (ref 39.0–52.0)
Hemoglobin: 9.8 g/dL — ABNORMAL LOW (ref 13.0–17.0)

## 2021-11-14 MED ORDER — ENOXAPARIN SODIUM 100 MG/ML IJ SOSY
0.5000 mg/kg | PREFILLED_SYRINGE | INTRAMUSCULAR | Status: DC
  Administered 2021-11-14 – 2021-11-15 (×2): 92.5 mg via SUBCUTANEOUS
  Filled 2021-11-14 (×3): qty 0.93

## 2021-11-14 MED ORDER — ASCORBIC ACID 500 MG PO TABS
500.0000 mg | ORAL_TABLET | Freq: Two times a day (BID) | ORAL | Status: DC
  Administered 2021-11-14 – 2021-12-13 (×56): 500 mg via ORAL
  Filled 2021-11-14 (×59): qty 1

## 2021-11-14 NOTE — Progress Notes (Signed)
Nutrition Follow-up  DOCUMENTATION CODES:   Morbid obesity  INTERVENTION:   -30 ml Prosource Plus QID, each supplement provides 100 kcals and 15 grams protein -D/c 1 packet Juven BID, each packet provides 95 calories, 2.5 grams of protein (collagen), and 9.8 grams of carbohydrate (3 grams sugar); also contains 7 grams of L-arginine and L-glutamine, 300 mg vitamin C, 15 mg vitamin E, 1.2 mcg vitamin B-12, 9.5 mg zinc, 200 mg calcium, and 1.5 g  Calcium Beta-hydroxy-Beta-methylbutyrate to support wound healing  -Continue double protein portions with meals -Continue Cholecalciferol 2000 units daily  -500 mg vitamin C BID   NUTRITION DIAGNOSIS:   Inadequate oral intake related to inability to eat (pt sedated and ventilated) as evidenced by NPO status.  Progressing   GOAL:   Patient will meet greater than or equal to 90% of their needs  Progressing   MONITOR:   PO intake, Supplement acceptance, Labs, Weight trends, TF tolerance, Skin, I & O's  REASON FOR ASSESSMENT:   Ventilator    ASSESSMENT:   66 y/o male with h/o PUD, OSA, HTN, CKD III, substance abuse, CHF, Hep C and COPD and COVID 19 (02/2021) who is admitted with COPD exacerbation, CHF and AKI.  -Pt s/p tracheostomy 6/20 -Pt s/p 15F IR G-tube 6/30 7/25- enteral mends and supplements transitioned to PO  Reviewed I/O's: -1.3 L x 24 hours and -10.3 L since 10/31/21  UOP: 1.5 L x 24 hours  Spoke with pt at bedside, who reports feeling better today. He reports he is eating almost all of his food. Noted meal completions 60-100%. Noted pt refusing supplements orally. Per pt, supplements bloat him and give him diarrhea. RD discussed importance of protein supplements and rationale for ordering- he is amenable to take Prosource by mouth. RD provided pt with emotional support and encouragement. He is frustrated about being in the hospital for so long, but shares that two of his sisters visit him a few times per week. RD reviewed  care plan and explained how ordered interventions will help him meet his goals. Pt expressed appreciation and understanding.   Reviewed wt hx; pt has experienced a 2.2% wt loss over the past week.   Medications reviewed and include vitamin D3, prednisone, and demadex.   Labs reviewed.   Diet Order:   Diet Order             Diet 2 gram sodium Room service appropriate? Yes with Assist; Fluid consistency: Thin  Diet effective now                   EDUCATION NEEDS:   No education needs have been identified at this time  Skin:  Skin Assessment: Skin Integrity Issues: Skin Integrity Issues:: Incisions, Stage II, Other (Comment) Stage II: medial perineum Incisions: closed neck s/p trach Other: MASD to rt groin  Last BM:  11/14/21 (type 5)  Height:   Ht Readings from Last 1 Encounters:  11/03/21 '6\' 3"'$  (1.905 m)    Weight:   Wt Readings from Last 1 Encounters:  11/14/21 (!) 186.4 kg    Ideal Body Weight:  89 kg  BMI:  Body mass index is 51.37 kg/m.  Estimated Nutritional Needs:   Kcal:  1191-4782  Protein:  175-190 grams  Fluid:  > 2 L    Loistine Chance, RD, LDN, [place] Registered Dietitian II Certified Diabetes Care and Education Specialist Please refer to [facility] for RD and/or RD on-call/weekend/after hours pager

## 2021-11-14 NOTE — Progress Notes (Signed)
Physical Therapy Treatment Patient Details Name: Gary [name] MRN: [id number] DOB: Feb 19, 1956 Today's Date: 11/14/2021   History of Present Illness 66 y/o male admitted mid June secondary to SOB, COPD exacerbation, acute metabolic encephalopathy, acute on chronic hypoxic and hypercapnic respiratory failure in setting of acute decompensated HFpEF and AECOPD requiring intubation and mechanical ventilation. The pt is now s/p tracheostomy and PEG tube placement.    PT Comments    Pt laying flat in bed on arrival, eyes closed but interactive.  He reports that he just hasn't felt great since last night.  When PT stated about a +2 assistance on the way he refused sitting/standing up and told me they didn't need to come.  Pt agreeable to supine exercises with some convincing, but overall was more self limiting today than he has been for most of previous sessions this PT has done with him.  He ostensibly will be ready to go tomorrow and feels that he will be able to try some steps but "Just not feeling it today."     Recommendations for follow up therapy are one component of a multi-disciplinary discharge planning process, led by the attending physician.  Recommendations may be updated based on patient status, additional functional criteria and insurance authorization.  Follow Up Recommendations  Skilled nursing-short term rehab (<3 hours/day) Can patient physically be transported by private vehicle: No   Assistance Recommended at Discharge Frequent or constant Supervision/Assistance  Patient can return home with the following Two people to help with walking and/or transfers;Two people to help with bathing/dressing/bathroom;Assistance with cooking/housework;Assist for transportation;Help with stairs or ramp for entrance   Equipment Recommendations  None recommended by PT    Recommendations for Other Services       Precautions / Restrictions Precautions Precautions: Fall Restrictions Weight  Bearing Restrictions: No     Mobility  Bed Mobility               General bed mobility comments: Pt reports he feels too poorly today to try getting up or even sitting; agreed to some supine exercises.    Transfers                        Ambulation/Gait                   Stairs             Wheelchair Mobility    Modified Rankin (Stroke Patients Only)       Balance                                            Cognition Arousal/Alertness: Awake/alert Behavior During Therapy: WFL for tasks assessed/performed Overall Cognitive Status: Within Functional Limits for tasks assessed                                 General Comments: did not open eyes much, but able to interact, answer the phone, etc appropriately        Exercises General Exercises - Lower Extremity Ankle Circles/Pumps: Strengthening, 10 reps Quad Sets: Strengthening, 10 reps Heel Slides: AROM, 10 reps (with resisted leg ext) Hip ABduction/ADduction: Strengthening, 10 reps Straight Leg Raises: AAROM, 10 reps    General Comments        Pertinent Vitals/Pain Pain  Assessment Pain Assessment:  (vague headache and c/o LE swelling (b/l less swollen than last time this PT saw him )) Faces Pain Scale: Hurts a little bit    Home Living                          Prior Function            PT Goals (current goals can now be found in the care plan section) Progress towards PT goals: Not progressing toward goals - comment (feeling poorly today, reports he is eager to try and get up next time with PT)    Frequency    Min 2X/week      PT Plan Current plan remains appropriate    Co-evaluation              AM-PAC PT "6 Clicks" Mobility   Outcome Measure  Help needed turning from your back to your side while in a flat bed without using bedrails?: A Lot Help needed moving from lying on your back to sitting on the side of a  flat bed without using bedrails?: A Lot Help needed moving to and from a bed to a chair (including a wheelchair)?: Total Help needed standing up from a chair using your arms (e.g., wheelchair or bedside chair)?: Total Help needed to walk in hospital room?: Total Help needed climbing 3-5 steps with a railing? : Total 6 Click Score: 8    End of Session Equipment Utilized During Treatment: Oxygen Activity Tolerance: Patient tolerated treatment well Patient left: in bed;with call bell/phone within reach   PT Visit Diagnosis: Unsteadiness on feet (R26.81);Muscle weakness (generalized) (M62.81);Difficulty in walking, not elsewhere classified (R26.2)     Time: 1443-1510 PT Time Calculation (min) (ACUTE ONLY): 27 min  Charges:  $Therapeutic Exercise: 23-37 mins                     Kreg Shropshire, DPT 11/14/2021, 3:28 PM

## 2021-11-14 NOTE — Progress Notes (Addendum)
Checked on patient- noted to have his trach ties removed. Patient stated "I removed it because it was too tight". Educated. Encouraged to keep trach ties on, educated that airway will be compromised if trach tie is off.   Teeth brushed. Gtube flushed.   Refused all protein supplements- educated.   0930 Patient screaming for help. When checked stated that her condom cath is leaking. Peri care done; condom cath switched to [facility] purewick to suction @ 80.

## 2021-11-14 NOTE — Progress Notes (Signed)
In PROGRESS NOTE    Gary [name]  QIW:[id number] DOB: 1955-08-22  DOA: 09/19/2021 Date of Service: 11/14/21 PCP: [facility] Course:  66 y/o male with multiple medical problems causing chronic hypercapneic respiratory failure who has had admissions in the past for exacerbations of the same and mechanical ventilation presented for evaluation of dyspnea, weakness. It is unclear if the patient has been compliant with his medications and NIMV at home.  He was admitted by the hospitalists for a presumed COPD exacerbation in the ER, treated with bronchodilators and NIMV but his mental status and hypercarbia worsened despite those interventions so he required intubation.   6/6: Presented to ED.  Required intubation and mechanical ventilation in the ED.  PCCM admit ICU 6/7: Hold diuresis due to worsening Creatinine.  ENT consulted for Trach placement per family request 6/8: Pt continues to spike temps tmax 101.8 degrees F.  Flexiseal placed overnight draining foul smelling watery stool~Cdiff and GI panel pending  6/9: Fever resolved.  Negative for C- diff, GI panel still pending.  Creatinine slightly worsened, requiring low dose levophed (2 mcg). Holding diuresis 6/10: propofol discontinued due hypertriglyceridemia; failed SBT 6/11: Tracheal aspirate from 6/6 resulting with Pseudomonas, ABX changed to Cefepime. neurologically intact, awaiting Trach placement 6/12: Pt calmer today following addition of oral benzo's, narcotics, and Seroquel via tube.  Creatinine worsened with decreased UOP, holding diuresis, low threshold for Nephrology consult  6/13: Pt with worsening renal failure creatinine 4.15.  UOP overnight 25 ml.  Nephrology consulted plans to start pt on hemodialysis follow dialysis catheter placement Pt underwent hemodialysis without ultrafiltration 06/14: .  Plans for HD session today.  Agitation has improved significantly with narcotics and antianxiety medications per  tube   06/16: Tracheostomy canceled 06/15 due to hyperkalemia and tentatively rescheduled for 06/20  6/18 failure to wean from vent 06/20: ENT placed Tracheostomy (#7.0 XLT cuffed).  Developed mild epistaxis post unsuccessful attempt at NGT placement.  Later developed hematochezia/melena, Protonix gtt started and GI consulted. 06/21: Diuresed yesterday with limited results. Patient had softed blood pressures throughout the day and diuresis was held as a result. HD was performed by the patient clotted off the machine and was not able to be rinsed back. Repeat hemoglobin is above 8. 06/23: Pt remains mechanically intubated settings: FiO2 50%/PEEP 10.  CXR concerning for moderate right pleural effusion with associated right basilar atelectasis vs. infiltrate.  Will perform [place] Chest to determine if pt needs right sided thoracentesis  06/24: Remains on the ventilator, FiO2 requirements decreasing, respiratory culture with abundant gram-negative rods: [name], on Meropenem 06/25: Oxygen requirements down to 40%, chest x-ray shows improvement on dense right consolidation, Pseudomonas susceptibilities pending 06/25: Pt remains on the ventilator settings: PEEP 10 and FiO2 40%; will attempt to wean PEEP to 5 to perform SBT  06/28: Pt tolerated PS on 06/27.  Overnight pt had episodes of vomiting TF's suspect secondary to malposition of nasogastric tube.  Will consult IR for PEG tube placement if unable to place PEG tube pt will need postpyloric Dobbhoff   06/29: Pt tolerated PS 5/5 for over an hour yesterday, however due to fatigue placed back on PRVC.  Pt pulled NGT out several times overnight, therefore will not attempt to reinsert.  Plans for PEG tube placement per IR on 06/30.   06/30: PEG tube placed per IR  07/1: Pt tolerated trach collar _0 % throughout the day 07/2: Pt placed on ventilator overnight settings 16/5/35% tolerating well.  Will place pt on trach collar today and place orders for  PT/OT 7/3: Hemodynamically stable.  Tolerating TC (has remained on TC since 7/2 @ 08:00), plan for speech therapy and PT/OT 7/4: Has maintained on trach collar for greater than 48 hours, tolerating Passy-Muir valve.  Plan to transfer out of ICU. SLP recs: dysphagia 1 (puree) w/ nectar liquid.  7/5-7/6: sodium and creatinine trending appropriately, Hgb stable. Low K repleted and stable. Was off vent qhs (orders d/c or missed?) and was restarted. Will need LTAC, TOC following  7/7: pulled out trach overnight. Confusion re: vent plan among staff and myself. I asked PCCM to reevluate him and verify plan so the team and the patient can eb on the same page, and will help TOC arrange appropriate placement.  7/8: I spoke personally w/ Dr Milon Dikes [facility]) and he confirms patient does NOT need ventilator support, unless of course develops distress, but last night the patient tolerated well without it. Plan to meet w/ family today when they visit, I spoke w/ his sister yesterday on the phone. No concerns on CBC/BMP: Cr tending down a bit, Hgb stable, Na slightly high but improving over past few days. He is approaching medically stable and may be appropriate for LTAC vs SNF soon  7/9: Several long discussions with patient and family about medical condition, behavioral issues.  See separate progress notes.  Psychiatry evaluated, agree that he has capacity, started SSRI. 7/10: Patient remains medically stable, awaiting placement.  Discussed his behavioral issues again and encouraged him to always bring any concerns to nursing staff or to me calmly and I am happy to listen, requested he maintain professionalism towards staff, he seems amenable. 7/11-7/14: goal OOB to chair, needing hoyer lift, pt reeducated several times about staying in bed 07/11 and has been compliant through 07/14. Finally got bariatric chair and hoyer lift 7/12 and was OOB 07/13.  D/c monitoring and sitter and transferred to [place] status 07/13. Awaiting  placement.  7/15-17- working to reorient patient, but personality and coherence remain difficult. Stable for placement. Reminded patient not to verbally abuse staff 7/28 - rising O2 needs past couple of days, due to mucus plugging.  ABG stable, normal mentation.  Improved after suctioning.  Consultants:  PCCU Nephrology ENT GI IR Psychiatry  Procedures: 10/03/21: tracheostomy placement 10/12/21: dialysis permacath placement  10/13/21: G-tube placed  Subjective: Patient was sleeping comfortably when seen on rounds this morning.  He woke briefly denied any current acute complaints.  No acute events reported overnight.  Per RN, patient refusing to have labs drawn today.   ASSESSMENT & PLAN:   Principal Problem:   Acute on chronic respiratory failure with hypoxia and hypercapnia ([name]) Active Problems:   Major depressive disorder, recurrent episode, moderate ([name])   Bloody stool   COPD with acute exacerbation ([name])   Acute metabolic encephalopathy   HTN (hypertension)   Acute on chronic diastolic congestive heart failure ([name])   Obesity, Class III, BMI 40-49.9 (morbid obesity) ([place])   Obstructive sleep apnea   Chronic kidney disease, stage 3a ([name])   HLD (hyperlipidemia)   Pressure injury of skin  Left foot blood blister - appreciate podiatry evaluation; underwent removal of blister bedside with podiatry on 7/21 - prevalon boots ordered to aid with pressure wounds going forward  Acute on Chronic Hypoxic Hypercapnic Respiratory Failure Acute exacerbation COPD Pseudomonal Pna OSA/OHS - S/p  tracheostomy by ENT by 6/20 - Trach collar w/ ventilator support as needed / qhs but he  does NOT need vent supoort overnight routinely per conversation w/ Dr Milon Dikes 07/08 - Completed 10 day course of ceftazidime for treatment of Pseudomonas - At this point, he is medically stable but placement is going to be an issue for him.  His behavioral issues have been discussed with him and his  behavior has improved  - Monitor mental status closely and check blood gas PRN - Has OHS and was reliant on nightly bipap prior to trach 7/28 - rising O2 needs and desatting episodes to 50's this AM.  CXR with no PNA, o nly mild pulmonary vascular congestion, BNP normal. No fevers or cough.  ABG today with PCO2 75 (near baseline for him) and patient mentating well.  Continue to monitor. --PCCM updated --Added Brovana nebs BID --Suction for mucus plugging PRN --Transferred to Progressive 7/29 for closer monitoring  7/29-8/1: stable on 10 L/min, no reports of significant de-sat episodes   Blood stool - new 7/30 - notified this afternoon by RN, pt had bloody BM, large amount of blood and clots were passed.  Pt reports long history of same, intermittently, but is unsure if it's been evaluated or what the cause is (unsure if hemorrhoid or diverticulosis). --Check Hbg --Stop Lovenox for now --Monitor closely & consider GI consult    Bilateral hand and foot pain - hx of gout, treated for a few days with colchicine last week.  Now having diffuse joint pains in hands and feel.   Pt and family ?rheumatoid arthritis.   Uric acid level was normal last week ESR 48, CRP 4.2 - nonspecific Rheumatoid factor elevated 58.8 --Check anti-CCP --Continue prednisone, reduce to 20 mg  --Monitor for improvement  Acute decompensated HFpEF Hypertension - Echocardiogram 08/07/2021: LVEF 60 to 83%, grade 1 diastolic dysfunction, normal RV systolic function - Was on intermittent hemodialysis for volume removal  - has not needed HD and has remained compensated. Cancelled permacath placement  - resumed home torsemide 7/27   AKI superimposed CKD stage IIIa  Hypokalemia  Hypernatremia - During admissions in February 2023, creatinine 1.8 with GFR 40; in May 2023 creatinine 1.1 with GFR of 60 - Monitor I&O's / urinary output - Cr down, good UOP noted, dialysis catheter removed 7/30: Cr up to 1.45, from 1.17 on  7/27.   7/31: Cr improved 1.29 8/1: Cr stable 1.33 - Encourage PO hydration/intake - Hold off fluids for now  - Repeat BMP tomorrow   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 - resolved  Pseudomonas Pneumonia  - Completed 10 day course of ceftazidime (started on 06/26) - C. Diff negative   Acute Blood Loss Anemia - resolved  Nasopharyngeal trauma vs Upper GI bleed - Continue BID IV PPI - per GI, no need for scope    Hyperglycemia - CBG's achs; patient refusing at times - SSI  Acute Metabolic Encephalopathy in setting of CO2 Narcosis Sedation needs in setting of mechanical ventilation - CT Head 09/19/21: negative;  UDS negative - Treatment of hypercapnia as outlined above   Trach in place Gtube in place - No longer requiring vent support - PO intake has improved, SLP and dietary following  -Continue trach collar supplemental O2 to maintain sats >88% - Continues to eat well; G-tube will have to remain in place at least 3 months prior to removal   DVT prophylaxis:lovenox Code Status: FULL Disposition Plan / TOC needs: to med-surg 07/13, awaiting SNF placement  Barriers to discharge / significant pending items: remains on trach; behavioral issues  Objective: Vitals:  11/14/21 1238 11/14/21 1618 11/14/21 1620 11/14/21 2001  BP:  113/75  121/79  Pulse: 84 83 86 81  Resp: (!) 21 (!) 23 (!) 22 20  Temp:  98.2 F (36.8 C)  98.1 F (36.7 C)  TempSrc:  Oral  Oral  SpO2: 95% 97% 97% 93%  Weight:      Height:        Intake/Output Summary (Last 24 hours) at 11/14/2021 2118 Last data filed at 11/14/2021 1903 Gross per 24 hour  Intake --  Output 1700 ml  Net -1700 ml   Filed Weights   11/10/21 0500 11/13/21 0500 11/14/21 0400  Weight: (!) 185.1 kg (!) 187.8 kg (!) 186.4 kg   Physical Exam:  General exam: sitting up in bed eating lunch, awake & alert, no acute distress, morbidly obese HEENT: trach with PMV in place, trach collar Respiratory system: normal  respiratory effort at rest, trach collar on 10 L/min supplemental O2  Cardiovascular system: normal S1/S2, RRR Gastrointestinal system: PEG present, soft non-tender, +bowel sounds. Central nervous system: no gross focal neurologic deficits, normal speech Extremities: no lower extremity edema, normal tone    Labs and data Reviewed:    Notable labs -- Cr 1.29 >> 1.33, Cl 97, CO2 34, BUN 37, GFR 60, Ca 8.4, Hbg 9.8 stable   LOS: 56 days     Ezekiel Slocumb, DO [facility] 11/14/2021, 9:18 PM    If 7PM-7AM, please contact night-coverage [email or web address]

## 2021-11-14 NOTE — Progress Notes (Signed)
Patient turned and repositioned, bed pad changed. Oral care done. Gtube flushed. Muscle rub administered to feet x 2.   Male purewick on and secured; suctioning well.   Denies any needs at this time.

## 2021-11-14 NOTE — Progress Notes (Signed)
Shift encounters/timeline for pt care:  2000 hrs: pt used call bell. RN responded and pt stated that he was having "throbbing foot pain". Pt was alert and calm at this time. At 2020 hrs., RN gave PRN Oxycodone for the foot pain. Pt also requested to be turned to his right side. RN and NT assisted pt to turn to right side. Pt stated that he was comfortable and did not need anything else.  2100: RN went into pt's room to fix telemetry leads. Pt sleeping and stated that he did not need anything else. He also told RN that his foot was feeling better due to the pain medication.  2150: Pt seemed more anxious during this encounter and stated that no one had been in to see him in over 2 hrs. RN reassured pt that he will be checked on at least every hour, and reminded him to use the call bell. RN asked pt if he wanted to be cleaned up. Pt stated that he would like a bath at this time. NT assisted in giving pt bath and full linen change. Pt was less anxious after bath and thanked staff for the help.  2250: RN went in room to give pt medications. Pt talking on the phone with a family member when RN entered the room. Pt calm and took nighttime medications, except refused the [place]. Oral care performed. Pt admitted to RN that this situation has been hard for him and stated, "it's lonely back here". RN listened to pt and reassured him that he would be checked on at least hourly or when he needed [place].   0000: pt given ice chips  0010: pt's floor cleaned/mopped by environmental services.  0020: RN checked on pt. Pt requested and was given a sandwich. Pt alert and calm during interaction.RN also applied PRN muscle cream during this encounter.  0120: RN checked on pt; pt sleeping at this time.  0220: RN check on pt; pt sleeping at this time.  0320: Pt appeared to be resting comfortably in bed; does not need anything at this time. Pt calm and cooperative at this time.  0350: Labs drawn. RN checked on pt and pt  stated that his condom catheter came off. NT and RN placed new condom catheter, cleaned the pt and performed linen change.  3007: Pt requested PRN Oxycodone for foot pain. Pt calm and cooperative during interaction.  0525: RT in to see pt. RN also checked on pt. Pt states that he does not need anything at this time and that his pain is better after receiving pain medication.  6226: Pt requested cough medicine. PRN Benzonatate given for cough. Pt alert, calm and cooperative with RN.  3335: Pt stated that cough medicine helped. Pt alert and calm at this time; watching the morning news on the television.   0710: Bedside report given to day shift RN. Pt alert and calm.

## 2021-11-14 NOTE — Progress Notes (Signed)
Upon entry into patient's room, noticed trach tie was not secure. Patient states he took one side of trach tie off because it was too tight. I explained to patient the danger of possible decannulation when removing or undoing trach ties. New trach ties replaced, patient states this feels better. RN is aware, no further changes at this time.

## 2021-11-15 DIAGNOSIS — J9621 Acute and chronic respiratory failure with hypoxia: Secondary | ICD-10-CM | POA: Diagnosis not present

## 2021-11-15 DIAGNOSIS — J9622 Acute and chronic respiratory failure with hypercapnia: Secondary | ICD-10-CM | POA: Diagnosis not present

## 2021-11-15 LAB — CYCLIC CITRUL PEPTIDE ANTIBODY, IGG/IGA: CCP Antibodies IgG/IgA: 5 units (ref 0–19)

## 2021-11-15 LAB — BASIC METABOLIC PANEL
Anion gap: 7 (ref 5–15)
BUN: 30 mg/dL — ABNORMAL HIGH (ref 8–23)
CO2: 36 mmol/L — ABNORMAL HIGH (ref 22–32)
Calcium: 8.4 mg/dL — ABNORMAL LOW (ref 8.9–10.3)
Chloride: 97 mmol/L — ABNORMAL LOW (ref 98–111)
Creatinine, Ser: 1.19 mg/dL (ref 0.61–1.24)
GFR, Estimated: >60 mL/min (ref >60)
Glucose, Bld: 124 mg/dL — ABNORMAL HIGH (ref 70–99)
Potassium: 4.1 mmol/L (ref 3.5–5.1)
Sodium: 140 mmol/L (ref 135–145)

## 2021-11-15 NOTE — Progress Notes (Signed)
Shift timeline/encounters:  1920: Bedside report with day shift RN. Pt alert and calm.  2015: pt sleeping  2100: Pt's external catheter leaking. New one placed, peri care performed, gown change and full linen change provided. Pt repositioned to the right side.   2145: Pt requested and received a sandwich and grape juice. Pt calm/cooperative.  2245: Night medications given. Pt repositioned  2330: Mepilex heel foam placed on left foot per pt request. Pt calm/cooperative with RN.  0030: Pt sleeping  0105: Pt requested Passey muir speaking valve to be removed at this time.  0120: Pt requested RN to reposition him in bed. Pt calm/cooperative and thanked Therapist, sports for the assistance.  0215: Pt sleeping  0315: Pt sleeping  0410: Pt had bowel movement. Peri care and gown change performed by NT and RN. Pt repositioned. Pt calm/cooperative and appreciative of our help.  0505: Pt sleeping  0600: Pt used call bell requesting pain medicine for foot pain. PRN Oxycodone given.   0630: Pt used call bell. RN responded and pt requested to be repositioned in bed. Pt calm/cooperative and said "thank you" to RN.

## 2021-11-15 NOTE — Progress Notes (Signed)
PROGRESS NOTE    Gary [name]  IEP:[id number] DOB: [birth year] DOA: 09/19/2021 PCP: [facility] [place]   Brief Narrative: This 66 y/o male with multiple medical problems causing chronic hypercapneic respiratory failure who has had admissions in the past for exacerbations of the same and mechanical ventilation presented for evaluation of dyspnea, weakness. It is unclear if the patient has been compliant with his medications and NIMV at home.  He was admitted by the hospitalists for a presumed COPD exacerbation in the ER, treated with bronchodilators and NIMV but his mental status and hypercarbia worsened despite those interventions so he required intubation.   6/6: Presented to ED.  Required intubation and mechanical ventilation in the ED.  PCCM admitted in ICU 6/7: Hold diuresis due to worsening Creatinine.  ENT consulted for Trach placement per family request 6/8: Pt continues to spike temps tmax 101.8 degrees F.  Flexiseal placed overnight draining foul smelling watery stool~Cdiff and GI panel negative 6/9: Fever resolved.  Negative for C- diff, GI panel still pending.  Creatinine slightly worsened, requiring low dose levophed (2 mcg). Holding diuresis 6/10: propofol discontinued due hypertriglyceridemia; failed SBT 6/11: Tracheal aspirate from 6/6 resulting with Pseudomonas, ABX changed to Cefepime. neurologically intact, awaiting Trach placement 6/12: Pt calmer today following addition of oral benzo's, narcotics, and Seroquel via tube.  Creatinine worsened with decreased UOP, holding diuresis, low threshold for Nephrology consult  6/13: Pt with worsening renal failure creatinine 4.15.  UOP overnight 25 ml.  Nephrology consulted plans to start pt on hemodialysis follow dialysis catheter placement Pt underwent hemodialysis without ultrafiltration 06/14: .  Plans for HD session today.  Agitation has improved significantly with narcotics and antianxiety medications per tube   06/16:  Tracheostomy canceled 06/15 due to hyperkalemia and tentatively rescheduled for 06/20  6/18 failure to wean from vent 06/20: ENT placed Tracheostomy (#7.0 XLT cuffed).  Developed mild epistaxis post unsuccessful attempt at NGT placement.  Later developed hematochezia/melena, Protonix gtt started and GI consulted. 06/21: Diuresed yesterday with limited results. Patient had softed blood pressures throughout the day and diuresis was held as a result. HD was performed by the patient clotted off the machine and was not able to be rinsed back. Repeat hemoglobin is above 8. 06/23: Pt remains mechanically intubated settings: FiO2 50%/PEEP 10.  CXR concerning for moderate right pleural effusion with associated right basilar atelectasis vs. infiltrate.  Will perform [place] Chest to determine if pt needs right sided thoracentesis  06/24: Remains on the ventilator, FiO2 requirements decreasing, respiratory culture with abundant gram-negative rods: [name], on Meropenem 06/25: Oxygen requirements down to 40%, chest x-ray shows improvement on dense right consolidation, Pseudomonas susceptibilities pending 06/25: Pt remains on the ventilator settings: PEEP 10 and FiO2 40%; will attempt to wean PEEP to 5 to perform SBT  06/28: Pt tolerated PS on 06/27.  Overnight pt had episodes of vomiting TF's suspect secondary to malposition of nasogastric tube.  Will consult IR for PEG tube placement if unable to place PEG tube pt will need postpyloric Dobbhoff   06/29: Pt tolerated PS 5/5 for over an hour yesterday, however due to fatigue placed back on PRVC.  Pt pulled NGT out several times overnight, therefore will not attempt to reinsert.  Plans for PEG tube placement per IR on 06/30.   06/30: PEG tube placed per IR  07/1: Pt tolerated trach collar _0 % throughout the day 07/2: Pt placed on ventilator overnight settings 16/5/35% tolerating well.  Will place pt on trach  collar today and place orders for PT/OT 7/3:  Hemodynamically stable.  Tolerating TC (has remained on TC since 7/2 @ 08:00), plan for speech therapy and PT/OT 7/4: Has maintained on trach collar for greater than 48 hours, tolerating Passy-Muir valve.  Plan to transfer out of ICU. SLP recs: dysphagia 1 (puree) w/ nectar liquid.  7/5-7/6: sodium and creatinine trending appropriately, Hgb stable. Low K repleted and stable. Was off vent qhs (orders d/c or missed?) and was restarted. Will need LTAC, TOC following  7/7: pulled out trach overnight. Confusion re: vent plan among staff and myself. I asked PCCM to reevluate him and verify plan so the team and the patient can eb on the same page, and will help TOC arrange appropriate placement.  7/8: I spoke personally w/ Dr Milon Dikes [facility]) and he confirms patient does NOT need ventilator support, unless of course develops distress, but last night the patient tolerated well without it. Plan to meet w/ family today when they visit, I spoke w/ his sister yesterday on the phone. No concerns on CBC/BMP: Cr tending down a bit, Hgb stable, Na slightly high but improving over past few days. He is approaching medically stable and may be appropriate for LTAC vs SNF soon  7/9: Several long discussions with patient and family about medical condition, behavioral issues.  See separate progress notes.  Psychiatry evaluated, agree that he has capacity, started SSRI. 7/10: Patient remains medically stable, awaiting placement.  Discussed his behavioral issues again and encouraged him to always bring any concerns to nursing staff or to me calmly and I am happy to listen, requested he maintain professionalism towards staff, he seems amenable. 7/11-7/14: goal OOB to chair, needing hoyer lift, pt reeducated several times about staying in bed 07/11 and has been compliant through 07/14. Finally got bariatric chair and hoyer lift 7/12 and was OOB 07/13.  D/c monitoring and sitter and transferred to [place] status 07/13. Awaiting placement.   7/15-17- working to reorient patient, but personality and coherence remain difficult. Stable for placement. Reminded patient not to verbally abuse staff 7/28 - rising O2 needs past couple of days, due to mucus plugging.  ABG stable, normal mentation.  Improved after suctioning. 8/2: Patient awaiting placement in a long-term acute care setting..  Assessment & Plan:   Principal Problem:   Acute on chronic respiratory failure with hypoxia and hypercapnia ([name]) Active Problems:   Major depressive disorder, recurrent episode, moderate ([name])   Bloody stool   COPD with acute exacerbation ([name])   Acute metabolic encephalopathy   HTN (hypertension)   Acute on chronic diastolic congestive heart failure ([name])   Obesity, Class III, BMI 40-49.9 (morbid obesity) ([place])   Obstructive sleep apnea   Chronic kidney disease, stage 3a ([name])   HLD (hyperlipidemia)   Pressure injury of skin   Left foot blood blister - appreciate podiatry evaluation; He underwent removal of blister at bedside by podiatry on 7/21 -  Use prevalon boots  to aid with pressure wounds going forward.   Acute on Chronic Hypoxic Hypercapnic Respiratory Failure Acute exacerbation COPD Pseudomonal PNA OSA/OHS - S/p  tracheostomy by ENT by 6/20. - Trach collar w/ ventilator support as needed / qhs but he does NOT need vent supoort overnight routinely per conversation w/ Dr Milon Dikes 07/08 - Completed 10 day course of ceftazidime for treatment of Pseudomonas. - At this point, he is medically stable but placement is going to be an issue for him.   -  His behavioral issues  have been discussed with him and his behavior has improved  - Monitor mental status closely and check blood gas PRN - Has OHS and was reliant on nightly bipap prior to trach 7/28 - He has rising O2 needs and desatting episodes to 50's this AM.  CXR with no PNA, only mild pulmonary vascular congestion, BNP normal. No fevers or cough.  ABG  with PCO2 75 (near baseline  for him) and patient mentating well.  Continue to monitor. --PCCM updated --Added Brovana nebs BID --Suction for mucus plugging PRN --Transferred to Progressive 7/29 for closer monitoring 7/29-8/2: stable on 10 L/min, no reports of significant de-sat episodes.   Blood bowel movement: 7/30 - notified by RN, pt had bloody BM, large amount of blood and clots were passed.  Pt reports long history of same, intermittently, but is unsure if it's been evaluated or what the cause is (unsure if hemorrhoid or diverticulosis). -- Hb remains stable 9.9 --Stop Lovenox for now --Monitor closely & consider GI consult      Bilateral hand and foot pain -  He has hx of gout, treated for a few days with colchicine last week.   Now having diffuse joint pains in hands and feet.   Pt and family  reports hx. of rheumatoid arthritis.   Uric acid level was normal last week ESR 48, CRP 4.2 - nonspecific Rheumatoid factor elevated 58.8 --Check anti-CCP --Continue prednisone, reduce to 20 mg  --Monitor for improvement   Acute decompensated HFpEF Hypertension - Echocardiogram 08/07/2021: LVEF 60 to 16%, grade 1 diastolic dysfunction, normal RV systolic function - Was on intermittent hemodialysis for volume removal. - has not needed HD and has remained compensated. Cancelled permacath placement  - resumed home torsemide 7/27   AKI superimposed CKD stage IIIa > Resolved Hypokalemia > Resolved. Hypernatremia > Resolved. - During admissions in February 2023, creatinine 1.8 with GFR 40; in May 2023 creatinine 1.1 with GFR of 60 - Monitor I&O's / urinary output - Cr down, good UOP noted, dialysis catheter removed - Encourage PO hydration/intake - Hold off fluids for now  - Repeat BMP tomorrow   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 - resolved  Pseudomonas Pneumonia  - Completed 10 day course of ceftazidime (started on 06/26) - C. Diff negative   Acute Blood Loss Anemia - resolved  Nasopharyngeal  trauma vs Upper GI bleed - Continue BID IV PPI - per GI, no need for scope    Hyperglycemia - CBG's achs; patient refusing at times - SSI  Acute Metabolic Encephalopathy in setting of CO2 Narcosis Sedation needs in setting of mechanical ventilation - CT Head 09/19/21: negative;  UDS negative - Treatment of hypercapnia as outlined above     Trach in place Gtube in place - No longer requiring vent support - PO intake has improved, SLP and dietary following  -Continue trach collar supplemental O2 to maintain sats >88% - Continues to eat well; G-tube will have to remain in place at least 3 months prior to removal     DVT prophylaxis: Lovenox Code Status: Full code. Family Communication: (No family at bed side) Disposition Plan:   Status is: Inpatient Remains inpatient appropriate because: Awaiting SNF placement. Barriers; remains on SNF with behavioral issues.    Consultants:  PCCU Nephrology ENT GI IR Psychiatry  Procedures:  10/03/21: tracheostomy placement 10/12/21: dialysis permacath placement  10/13/21: G-tube placed    Antimicrobials:  Anti-infectives (From admission, onward)    Start     Dose/Rate Route  Frequency Ordered Stop   10/12/21 1200  cefTAZidime (FORTAZ) 2 g in sodium chloride 0.9 % 100 mL IVPB        2 g 200 mL/hr over 30 Minutes Intravenous 2 times daily 10/12/21 1122 10/17/21 0844   10/12/21 0417  ceFAZolin (ANCEF) IVPB 1 g/50 mL premix  Status:  Discontinued        1 g 100 mL/hr over 30 Minutes Intravenous 30 min pre-op 10/12/21 0417 10/16/21 0751   10/09/21 2000  ceFEPIme (MAXIPIME) 1 g in sodium chloride 0.9 % 100 mL IVPB  Status:  Discontinued        1 g 200 mL/hr over 30 Minutes Intravenous Every 24 hours 10/09/21 1404 10/09/21 1515   10/09/21 2000  cefTAZidime (FORTAZ) 1 g in sodium chloride 0.9 % 100 mL IVPB  Status:  Discontinued        1 g 200 mL/hr over 30 Minutes Intravenous Every 24 hours 10/09/21 1515 10/12/21 1122   10/06/21  1830  meropenem (MERREM) 500 mg in sodium chloride 0.9 % 100 mL IVPB  Status:  Discontinued        500 mg 200 mL/hr over 30 Minutes Intravenous Every 24 hours 10/06/21 1735 10/09/21 1403   10/06/21 1800  doxycycline (VIBRAMYCIN) 100 mg in sodium chloride 0.9 % 250 mL IVPB  Status:  Discontinued        100 mg 125 mL/hr over 120 Minutes Intravenous Every 12 hours 10/06/21 1657 10/08/21 0838   09/29/21 1000  ceFEPIme (MAXIPIME) 2 g in sodium chloride 0.9 % 100 mL IVPB        2 g 200 mL/hr over 30 Minutes Intravenous Every 12 hours 09/29/21 0812 09/30/21 2342   09/26/21 2200  ceFEPIme (MAXIPIME) 1 g in sodium chloride 0.9 % 100 mL IVPB  Status:  Discontinued        1 g 200 mL/hr over 30 Minutes Intravenous Every 24 hours 09/26/21 1154 09/29/21 0812   09/26/21 2000  ceFEPIme (MAXIPIME) 2 g in sodium chloride 0.9 % 100 mL IVPB  Status:  Discontinued        2 g 200 mL/hr over 30 Minutes Intravenous Every 12 hours 09/26/21 0809 09/26/21 1154   09/24/21 1200  ceFEPIme (MAXIPIME) 2 g in sodium chloride 0.9 % 100 mL IVPB  Status:  Discontinued        2 g 200 mL/hr over 30 Minutes Intravenous Every 8 hours 09/24/21 0950 09/26/21 0809   09/21/21 1430  azithromycin (ZITHROMAX) 500 mg in sodium chloride 0.9 % 250 mL IVPB        500 mg 250 mL/hr over 60 Minutes Intravenous Every 24 hours 09/21/21 1344 09/23/21 1543   09/21/21 1430  cefTRIAXone (ROCEPHIN) 2 g in sodium chloride 0.9 % 100 mL IVPB  Status:  Discontinued        2 g 200 mL/hr over 30 Minutes Intravenous Every 24 hours 09/21/21 1344 09/24/21 0922        Subjective: Patient was seen and examined at bedside.  Overnight events noted.  Patient reports feeling better and states he wants to be discharged , Patient remains in bed,  has not get out of bed , still having some intermittent behavioral issues.  Objective: Vitals:   11/15/21 0736 11/15/21 0833 11/15/21 1151 11/15/21 1153  BP: 106/72  117/83   Pulse: 81 83 82 82  Resp: $Remo'20 18 20 18   'iVreX$ Temp: 98.1 F (36.7 C)  98.3 F (36.8 C)   TempSrc: Oral  Oral   SpO2: 98% 98% 97% 98%  Weight:      Height:        Intake/Output Summary (Last 24 hours) at 11/15/2021 1227 Last data filed at 11/15/2021 1152 Gross per 24 hour  Intake 480 ml  Output 1600 ml  Net -1120 ml   Filed Weights   11/10/21 0500 11/13/21 0500 11/14/21 0400  Weight: (!) 185.1 kg (!) 187.8 kg (!) 186.4 kg    Examination:  General exam: Appears comfortable, not in any acute distress. Respiratory system: Clear to auscultation. Respiratory effort normal. HEENT : Trach with PMV in place, trach collar Cardiovascular system: S1 & S2 heard, RRR.  Regular rate and rhythm, no murmur.  Gastrointestinal system: Abdomen is soft, nontender, nondistended, PEG noted, normal bowel sounds. Central nervous system: Alert and oriented x 3. No focal neurological deficits. Extremities: No edema, no cyanosis, no clubbing. Skin: No rashes, lesions or ulcers Psychiatry: Judgement and insight appear normal. Mood & affect appropriate.     Data Reviewed: I have personally reviewed following labs and imaging studies  CBC: Recent Labs  Lab 11/10/21 1023 11/12/21 1439 11/13/21 1022 11/14/21 0350  WBC 7.0  --  7.3  --   HGB 9.8* 10.9* 9.9* 9.8*  HCT 36.8* 40.7 36.7* 36.8*  MCV 83.6  --  82.5  --   PLT 153  --  169  --    Basic Metabolic Panel: Recent Labs  Lab 11/09/21 1500 11/12/21 0929 11/13/21 1022 11/14/21 0350 11/15/21 1118  NA 141 140 142 139 140  K 4.6 3.6 4.0 4.2 4.1  CL 100 98 96* 97* 97*  CO2 36* 36* 37* 34* 36*  GLUCOSE 119* 159* 101* 122* 124*  BUN 22 40* 39* 37* 30*  CREATININE 1.17 1.45* 1.29* 1.33* 1.19  CALCIUM 8.7* 8.4* 8.5* 8.4* 8.4*  MG 1.9  --   --   --   --   PHOS 4.5  --   --   --   --    GFR: Estimated Creatinine Clearance: 111.1 mL/min (by C-G formula based on SCr of 1.19 mg/dL). Liver Function Tests: No results for input(s): "AST", "ALT", "ALKPHOS", "BILITOT", "PROT", "ALBUMIN" in the  last 168 hours. No results for input(s): "LIPASE", "AMYLASE" in the last 168 hours. No results for input(s): "AMMONIA" in the last 168 hours. Coagulation Profile: No results for input(s): "INR", "PROTIME" in the last 168 hours. Cardiac Enzymes: No results for input(s): "CKTOTAL", "CKMB", "CKMBINDEX", "TROPONINI" in the last 168 hours. BNP (last 3 results) No results for input(s): "PROBNP" in the last 8760 hours. HbA1C: No results for input(s): "HGBA1C" in the last 72 hours. CBG: Recent Labs  Lab 11/10/21 2103 11/10/21 2119  GLUCAP 150* 148*   Lipid Profile: No results for input(s): "CHOL", "HDL", "LDLCALC", "Gary", "CHOLHDL", "LDLDIRECT" in the last 72 hours. Thyroid Function Tests: No results for input(s): "TSH", "T4TOTAL", "FREET4", "T3FREE", "THYROIDAB" in the last 72 hours. Anemia Panel: No results for input(s): "VITAMINB12", "FOLATE", "FERRITIN", "TIBC", "IRON", "RETICCTPCT" in the last 72 hours. Sepsis Labs: No results for input(s): "PROCALCITON", "LATICACIDVEN" in the last 168 hours.  No results found for this or any previous visit (from the past 240 hour(s)).   Radiology Studies: No results found.  Scheduled Meds:  (feeding supplement) PROSource Plus  30 mL Oral TID PC & HS   arformoterol  15 mcg Nebulization BID   vitamin C  500 mg Oral BID   atorvastatin  10 mg Oral Daily   budesonide (  PULMICORT) nebulizer solution  0.5 mg Nebulization BID   cholecalciferol  2,000 Units Oral Daily   enoxaparin (LOVENOX) injection  0.5 mg/kg Subcutaneous Q24H   escitalopram  5 mg Oral Daily   free water  30 mL Per Tube Q4H   mouth rinse  15 mL Mouth Rinse 4 times per day   pantoprazole  40 mg Oral BID   predniSONE  20 mg Oral Q breakfast   torsemide  20 mg Oral Daily   traZODone  50 mg Oral QHS   Continuous Infusions:  sodium chloride Stopped (10/07/21 0542)     LOS: 57 days    Time spent: 42 Mins    Kaelei Wheeler, MD [facility]   If 7PM-7AM, please  contact night-coverage

## 2021-11-15 NOTE — TOC Progression Note (Signed)
Transition of Care [facility]) - Progression Note    Patient Details  Name: Gary [name] MRN: [id number] Date of Birth: 26-Jun-1955  Transition of [facility] CM/SW Contact  Laurena Slimmer, RN Phone Number: 11/15/2021, 3:19 PM  Clinical Narrative:    No bed offers.    Expected Discharge Plan: Long Term Acute Care (LTAC) Barriers to Discharge: Continued Medical Work up  Expected Discharge Plan and Services Expected Discharge Plan: Long Term Acute Care (LTAC)   Discharge Planning Services: CM Consult Post Acute Care Choice: Long Term Acute Care (LTAC) Living arrangements for the past 2 months: Single Family Home                 DME Arranged: N/A DME Agency: NA       HH Arranged: NA HH Agency: NA         Social Determinants of Health (SDOH) Interventions    Readmission Risk Interventions    05/04/2021    1:26 PM 04/07/2021    1:39 PM 03/12/2021   11:54 AM  Readmission Risk Prevention Plan  Transportation Screening Complete Complete Complete  PCP or Specialist Appt within 3-5 Days  Complete Complete  HRI or [place]   Complete  Social Work Consult for [place] Planning/Counseling   Complete  Palliative Care Screening  Not Applicable Not Applicable  Medication Review Press photographer) Complete Complete Complete  PCP or Specialist appointment within 3-5 days of discharge Complete    HRI or [place] Complete    SW Recovery Care/Counseling Consult Complete    [place] Not Applicable

## 2021-11-15 NOTE — Plan of Care (Signed)
  Problem: Education: Goal: Ability to demonstrate management of disease process will improve Outcome: Progressing Goal: Ability to verbalize understanding of medication therapies will improve Outcome: Progressing   

## 2021-11-15 NOTE — Progress Notes (Signed)
Physical Therapy Treatment Patient Details Name: Gary [name] MRN: [id number] DOB: Jul 27, 1955 Today's Date: 11/15/2021   History of Present Illness 66 y/o male admitted mid June secondary to SOB, COPD exacerbation, acute metabolic encephalopathy, acute on chronic hypoxic and hypercapnic respiratory failure in setting of acute decompensated HFpEF and AECOPD requiring intubation and mechanical ventilation. The pt is now s/p tracheostomy and PEG tube placement.    PT Comments    Yesterday pt asked to be seen in the AM, on arrival today he was surprised and initially resistant.  Ultimately he agreed to work with PT and OT, however he had, for him, a relatively limited amount of mobility.  He did well getting to sitting and did well with supine exercises after OT left, but despite extensive encouragement, cuing, set up, explanation, elevated bed heights, etc he was unable to attain standing and overall did not show the improvement this PT was expecting/hoping.  Pt did struggle with R LE more then L LE during exercises today.  Recommendations for follow up therapy are one component of a multi-disciplinary discharge planning process, led by the attending physician.  Recommendations may be updated based on patient status, additional functional criteria and insurance authorization.  Follow Up Recommendations  Skilled nursing-short term rehab (<3 hours/day) Can patient physically be transported by private vehicle: No   Assistance Recommended at Discharge Frequent or constant Supervision/Assistance  Patient can return home with the following Two people to help with walking and/or transfers;Two people to help with bathing/dressing/bathroom;Assistance with cooking/housework;Assist for transportation;Help with stairs or ramp for entrance   Equipment Recommendations  None recommended by PT    Recommendations for Other Services       Precautions / Restrictions Precautions Precautions: Fall Precaution  Comments: IJ, G tube, trach Restrictions Weight Bearing Restrictions: No     Mobility  Bed Mobility Overal bed mobility: Needs Assistance Bed Mobility: Rolling, Supine to Sit, Sit to Supine Rolling: Min guard   Supine to sit: HOB elevated, Min guard Sit to supine: Mod assist   General bed mobility comments: Able to move supine<sit and to reposition towards HOB in supine w/o physical assist; Mod A for LE elevation and trunk repositioning during sit<supine    Transfers Overall transfer level: Needs assistance Equipment used: Rolling walker (2 wheels) Transfers: Sit to/from Stand             General transfer comment: Pt simply did not have "it" today and did not attain standing despite set up for and multiple actual attempts from various bed heights and with varying offerings of assist.    Ambulation/Gait                   Stairs             Wheelchair Mobility    Modified Rankin (Stroke Patients Only)       Balance Overall balance assessment: Needs assistance Sitting-balance support: Feet supported, No upper extremity supported Sitting balance-[name] Scale: Good Sitting balance - Comments: good sitting balance for most of session, but towards end of session scooted toward very edge of bed and required Mod A for scooting back onto bed       Standing balance comment: unable to come into standing this AM                            Cognition Arousal/Alertness: Awake/alert Behavior During Therapy: WFL for tasks assessed/performed Overall Cognitive Status: Within Functional  Limits for tasks assessed                                          Exercises General Exercises - Lower Extremity Ankle Circles/Pumps: Strengthening, 10 reps Quad Sets: Strengthening, 10 reps Short Arc Quad: Strengthening, AROM, 10 reps (struggled with R AROM) Heel Slides: AROM, 10 reps Hip ABduction/ADduction:  (with resited leg ext) Straight Leg  Raises: AAROM, 10 reps Other Exercises Other Exercises: Extensive education on attempts to stand and begin transfers for successful return to ambulation    General Comments        Pertinent Vitals/Pain Pain Assessment Pain Assessment: Faces Faces Pain Scale: Hurts little more Pain Location: general    Home Living                          Prior Function            PT Goals (current goals can now be found in the care plan section) Progress towards PT goals: Progressing toward goals    Frequency    Min 2X/week      PT Plan Current plan remains appropriate    Co-evaluation PT/OT/SLP Co-Evaluation/Treatment: Yes Reason for Co-Treatment: For patient/therapist safety;To address functional/ADL transfers;Complexity of the patient's impairments (multi-system involvement) PT goals addressed during session: Mobility/safety with mobility;Strengthening/ROM OT goals addressed during session: ADL's and self-care;Strengthening/ROM;Proper use of Adaptive equipment and DME      AM-PAC PT "6 Clicks" Mobility   Outcome Measure  Help needed turning from your back to your side while in a flat bed without using bedrails?: A Lot Help needed moving from lying on your back to sitting on the side of a flat bed without using bedrails?: A Little Help needed moving to and from a bed to a chair (including a wheelchair)?: Total Help needed standing up from a chair using your arms (e.g., wheelchair or bedside chair)?: A Lot Help needed to walk in hospital room?: Total Help needed climbing 3-5 steps with a railing? : Total 6 Click Score: 10    End of Session Equipment Utilized During Treatment: Oxygen Activity Tolerance: Patient limited by fatigue Patient left: in bed;with call bell/phone within reach Nurse Communication: Mobility status PT Visit Diagnosis: Unsteadiness on feet (R26.81);Muscle weakness (generalized) (M62.81);Difficulty in walking, not elsewhere classified (R26.2)      Time: 8588-5027 PT Time Calculation (min) (ACUTE ONLY): 40 min  Charges:  $Therapeutic Exercise: 8-22 mins $Therapeutic Activity: 8-22 mins                     Kreg Shropshire, DPT 11/15/2021, 1:42 PM

## 2021-11-15 NOTE — Progress Notes (Signed)
Occupational Therapy Treatment Patient Details Name: Gary [name] MRN: [id number] DOB: December 16, 1955 Today's Date: 11/15/2021   History of present illness 66 y/o male admitted mid June secondary to SOB, COPD exacerbation, acute metabolic encephalopathy, acute on chronic hypoxic and hypercapnic respiratory failure in setting of acute decompensated HFpEF and AECOPD requiring intubation and mechanical ventilation. The pt is now s/p tracheostomy and PEG tube placement.   OT comments  During recent sessions Gary [name] has expressed interest in standing, walking, and transferring to recliner. During today's session, however, he was reluctant to attempt standing, offering various reasons why this was not an appropriate time (his knees hurt, he had just taken medication, afternoon would be better, he was fearful of falling, etc.) With encouragement, pt was able to come to EOB sitting w/ CGA and to maintain good balance there. Made a number of pseudo-attempts at standing but never cleared buttocks from mattress. Required Mod A for return to bed from sitting, then able to use b/l UE to reposition himself toward HOB in supine. Because of pt's size and support needs, this session conducted as a co-tx with PT.   Recommendations for follow up therapy are one component of a multi-disciplinary discharge planning process, led by the attending physician.  Recommendations may be updated based on patient status, additional functional criteria and insurance authorization.    Follow Up Recommendations  Skilled nursing-short term rehab (<3 hours/day)    Assistance Recommended at Discharge Frequent or constant Supervision/Assistance  Patient can return home with the following  Two people to help with walking and/or transfers;Assistance with cooking/housework;Direct supervision/assist for medications management;Assist for transportation;Two people to help with bathing/dressing/bathroom   Equipment Recommendations        Recommendations for Other Services      Precautions / Restrictions Precautions Precautions: Fall Precaution Comments: IJ, G tube, trach Restrictions Weight Bearing Restrictions: No       Mobility Bed Mobility Overal bed mobility: Needs Assistance Bed Mobility: Rolling, Supine to Sit, Sit to Supine Rolling: Min guard   Supine to sit: HOB elevated, Min guard Sit to supine: Mod assist   General bed mobility comments: Able to move supine<sit and to reposition towards HOB in supine w/o physical assist; Mod A for LE elevation and trunk repositioning during sit<supine    Transfers Overall transfer level: Needs assistance Equipment used: Rolling walker (2 wheels) Transfers: Sit to/from Stand Sit to Stand: Mod assist          Lateral/Scoot Transfers: From elevated surface General transfer comment: Pt unable/unwill to move into standing today, despite +2 A and active cueing and encouragement     Balance Overall balance assessment: Needs assistance Sitting-balance support: Feet supported, No upper extremity supported Sitting balance-[name] Scale: Good Sitting balance - Comments: good sitting balance for most of session, but towards end of session scooted toward very edge of bed and required Mod A for scooting back onto bed     Standing balance-[name] Scale: Zero Standing balance comment: unable to come into standing this AM                           ADL either performed or assessed with clinical judgement   ADL Overall ADL's : Needs assistance/impaired                 Upper Body Dressing : Minimal assistance;Bed level   Lower Body Dressing: Maximal assistance Lower Body Dressing Details (indicate cue type and reason): donning socks  Functional mobility during ADLs: +2 for physical assistance;+2 for safety/equipment;Rolling walker (2 wheels);Moderate assistance      Extremity/Trunk Assessment Upper Extremity Assessment Upper Extremity  Assessment: Overall WFL for tasks assessed   Lower Extremity Assessment Lower Extremity Assessment: Generalized weakness        Vision       Perception     Praxis      Cognition Arousal/Alertness: Awake/alert Behavior During Therapy: WFL for tasks assessed/performed Overall Cognitive Status: Within Functional Limits for tasks assessed                                          Exercises Other Exercises Other Exercises: Extensive education on attempts to stand and begin transfers for successful return to ambulation otherwise expect further deconditioning. Other Exercises: Educ re: repositioning in bed, standing technique, proper placement of air supply on trach collar    Shoulder Instructions       General Comments      Pertinent Vitals/ Pain       Pain Assessment Faces Pain Scale: Hurts little more Pain Location: fingers, knees Pain Descriptors / Indicators: Aching, Grimacing, Discomfort Pain Intervention(s): Repositioned  Home Living                                          Prior Functioning/Environment              Frequency  Min 2X/week        Progress Toward Goals  OT Goals(current goals can now be found in the care plan section)  Progress towards OT goals: Progressing toward goals  Acute Rehab OT Goals OT Goal Formulation: With patient Time For Goal Achievement: 11/27/21 Potential to Achieve Goals: Good  Plan Discharge plan remains appropriate;Frequency remains appropriate    Co-evaluation    PT/OT/SLP Co-Evaluation/Treatment: Yes Reason for Co-Treatment: For patient/therapist safety;To address functional/ADL transfers PT goals addressed during session: Mobility/safety with mobility;Balance;Strengthening/ROM;Proper use of DME OT goals addressed during session: ADL's and self-care;Strengthening/ROM;Proper use of Adaptive equipment and DME      AM-PAC OT "6 Clicks" Daily Activity     Outcome Measure    Help from another person eating meals?: A Little Help from another person taking care of personal grooming?: A Little Help from another person toileting, which includes using toliet, bedpan, or urinal?: A Lot Help from another person bathing (including washing, rinsing, drying)?: A Lot Help from another person to put on and taking off regular upper body clothing?: A Little Help from another person to put on and taking off regular lower body clothing?: A Lot 6 Click Score: 15    End of Session Equipment Utilized During Treatment: Oxygen  OT Visit Diagnosis: Other abnormalities of gait and mobility (R26.89);Muscle weakness (generalized) (M62.81)   Activity Tolerance Patient tolerated treatment well   Patient Left in bed;with call bell/phone within reach   Nurse Communication Mobility status        Time: 1000-1030 OT Time Calculation (min): 30 min  Charges: OT General Charges $OT Visit: 1 Visit OT Treatments $Self Care/Home Management : 8-22 mins  [name], PhD, MS, OTR/L 11/15/21, 12:27 PM

## 2021-11-16 ENCOUNTER — Inpatient Hospital Stay: Payer: Medicaid Other

## 2021-11-16 ENCOUNTER — Encounter
Admission: EM | Disposition: A | Discharge status: Home / Self Care | Payer: Self-pay | Source: Home / Self Care | Attending: Internal Medicine

## 2021-11-16 DIAGNOSIS — K922 Gastrointestinal hemorrhage, unspecified: Secondary | ICD-10-CM

## 2021-11-16 DIAGNOSIS — D500 Iron deficiency anemia secondary to blood loss (chronic): Secondary | ICD-10-CM

## 2021-11-16 DIAGNOSIS — Z87891 Personal history of nicotine dependence: Secondary | ICD-10-CM

## 2021-11-16 DIAGNOSIS — J9622 Acute and chronic respiratory failure with hypercapnia: Secondary | ICD-10-CM | POA: Diagnosis not present

## 2021-11-16 DIAGNOSIS — N189 Chronic kidney disease, unspecified: Secondary | ICD-10-CM

## 2021-11-16 DIAGNOSIS — J9621 Acute and chronic respiratory failure with hypoxia: Secondary | ICD-10-CM | POA: Diagnosis not present

## 2021-11-16 DIAGNOSIS — I7143 Infrarenal abdominal aortic aneurysm, without rupture: Secondary | ICD-10-CM

## 2021-11-16 DIAGNOSIS — J449 Chronic obstructive pulmonary disease, unspecified: Secondary | ICD-10-CM

## 2021-11-16 DIAGNOSIS — I129 Hypertensive chronic kidney disease with stage 1 through stage 4 chronic kidney disease, or unspecified chronic kidney disease: Secondary | ICD-10-CM

## 2021-11-16 HISTORY — PX: EMBOLIZATION: CATH118239

## 2021-11-16 LAB — HEMOGLOBIN AND HEMATOCRIT, BLOOD
HCT: 34.1 % — ABNORMAL LOW (ref 39.0–52.0)
HCT: 33.9 % — ABNORMAL LOW (ref 39.0–52.0)
Hemoglobin: 9.2 g/dL — ABNORMAL LOW (ref 13.0–17.0)
Hemoglobin: 9.3 g/dL — ABNORMAL LOW (ref 13.0–17.0)

## 2021-11-16 LAB — PROTIME-INR
INR: 1 (ref 0.8–1.2)
Prothrombin Time: 13.2 seconds (ref 11.4–15.2)

## 2021-11-16 LAB — HEMOGLOBIN: Hemoglobin: 9.8 g/dL — ABNORMAL LOW (ref 13.0–17.0)

## 2021-11-16 SURGERY — EMBOLIZATION
Anesthesia: Moderate Sedation

## 2021-11-16 MED ORDER — HYDROMORPHONE HCL 1 MG/ML IJ SOLN
1.0000 mg | Freq: Once | INTRAMUSCULAR | Status: AC | PRN
  Administered 2021-11-17: 1 mg via INTRAVENOUS
  Filled 2021-11-16: qty 1

## 2021-11-16 MED ORDER — FENTANYL CITRATE (PF) 100 MCG/2ML IJ SOLN
INTRAMUSCULAR | Status: DC | PRN
  Administered 2021-11-16 (×4): 25 ug via INTRAVENOUS

## 2021-11-16 MED ORDER — SODIUM CHLORIDE 0.9% FLUSH
3.0000 mL | INTRAVENOUS | Status: DC | PRN
  Administered 2021-11-28: 3 mL via INTRAVENOUS

## 2021-11-16 MED ORDER — SODIUM CHLORIDE 0.9 % IV SOLN: INTRAVENOUS | Status: DC

## 2021-11-16 MED ORDER — IOHEXOL 350 MG/ML SOLN: 100.0000 mL | Freq: Once | INTRAVENOUS | Status: DC | PRN

## 2021-11-16 MED ORDER — MIDAZOLAM HCL 2 MG/ML PO SYRP: 8.0000 mg | ORAL_SOLUTION | Freq: Once | ORAL | Status: DC | PRN

## 2021-11-16 MED ORDER — CEFAZOLIN SODIUM-DEXTROSE 1-4 GM/50ML-% IV SOLN
INTRAVENOUS | Status: AC | PRN
  Administered 2021-11-16: 2 g via INTRAVENOUS

## 2021-11-16 MED ORDER — DIPHENHYDRAMINE HCL 50 MG/ML IJ SOLN: 50.0000 mg | Freq: Once | INTRAMUSCULAR | Status: DC | PRN

## 2021-11-16 MED ORDER — IODIXANOL 320 MG/ML IV SOLN
INTRAVENOUS | Status: DC | PRN
  Administered 2021-11-16: 75 mL via INTRA_ARTERIAL

## 2021-11-16 MED ORDER — CHLORHEXIDINE GLUCONATE CLOTH 2 % EX PADS
6.0000 | MEDICATED_PAD | Freq: Every day | CUTANEOUS | Status: DC
  Administered 2021-11-17 – 2021-11-30 (×13): 6 via TOPICAL

## 2021-11-16 MED ORDER — SODIUM CHLORIDE 0.9 % IV SOLN
INTRAVENOUS | Status: AC
Start: 2021-11-16 — End: 2021-11-17

## 2021-11-16 MED ORDER — SODIUM CHLORIDE 0.9% FLUSH
3.0000 mL | Freq: Two times a day (BID) | INTRAVENOUS | Status: DC
  Administered 2021-11-17 – 2021-12-13 (×48): 3 mL via INTRAVENOUS

## 2021-11-16 MED ORDER — SODIUM CHLORIDE 0.9% IV SOLUTION: Freq: Once | INTRAVENOUS | Status: DC

## 2021-11-16 MED ORDER — TECHNETIUM TC 99M-LABELED RED BLOOD CELLS IV KIT
20.0000 | PACK | Freq: Once | INTRAVENOUS | Status: AC | PRN
  Administered 2021-11-16: 20.73 via INTRAVENOUS

## 2021-11-16 MED ORDER — SODIUM CHLORIDE 0.9 % IV SOLN: 250.0000 mL | INTRAVENOUS | Status: DC | PRN

## 2021-11-16 MED ORDER — PANTOPRAZOLE SODIUM 40 MG IV SOLR
40.0000 mg | Freq: Two times a day (BID) | INTRAVENOUS | Status: DC
  Administered 2021-11-16 – 2021-11-29 (×27): 40 mg via INTRAVENOUS
  Filled 2021-11-16 (×27): qty 10

## 2021-11-16 MED ORDER — IOHEXOL 350 MG/ML SOLN
125.0000 mL | Freq: Once | INTRAVENOUS | Status: AC | PRN
  Administered 2021-11-16: 125 mL via INTRAVENOUS

## 2021-11-16 MED ORDER — ONDANSETRON HCL 4 MG/2ML IJ SOLN: 4.0000 mg | Freq: Four times a day (QID) | INTRAMUSCULAR | Status: DC | PRN

## 2021-11-16 MED ORDER — MIDAZOLAM HCL 5 MG/5ML IJ SOLN
INTRAMUSCULAR | Status: AC
  Filled 2021-11-16: qty 5

## 2021-11-16 MED ORDER — FENTANYL CITRATE (PF) 100 MCG/2ML IJ SOLN
INTRAMUSCULAR | Status: AC
  Filled 2021-11-16: qty 2

## 2021-11-16 MED ORDER — FAMOTIDINE 20 MG PO TABS: 40.0000 mg | ORAL_TABLET | Freq: Once | ORAL | Status: DC | PRN

## 2021-11-16 MED ORDER — METHYLPREDNISOLONE SODIUM SUCC 125 MG IJ SOLR: 125.0000 mg | Freq: Once | INTRAMUSCULAR | Status: DC | PRN

## 2021-11-16 MED ORDER — MIDAZOLAM HCL 2 MG/2ML IJ SOLN
INTRAMUSCULAR | Status: DC | PRN
  Administered 2021-11-16 (×4): 1 mg via INTRAVENOUS

## 2021-11-16 MED ORDER — CEFAZOLIN SODIUM-DEXTROSE 2-4 GM/100ML-% IV SOLN
2.0000 g | INTRAVENOUS | Status: AC
  Administered 2021-11-16: 2 g via INTRAVENOUS
  Filled 2021-11-16: qty 100

## 2021-11-16 SURGICAL SUPPLY — 22 items
CANNULA 5F STIFF (CANNULA) ×1 IMPLANT
CATH ANGIO 5F PIGTAIL 65CM (CATHETERS) ×1 IMPLANT
CATH MICROCATH PRGRT 2.8F 110 (CATHETERS) IMPLANT
CATH VS1 5X80 (CATHETERS) ×1 IMPLANT
COVER PROBE U/S 5X48 (MISCELLANEOUS) ×1 IMPLANT
DEVICE STARCLOSE SE CLOSURE (Vascular Products) ×1 IMPLANT
DEVICE TORQUE .025-.038 (MISCELLANEOUS) ×1 IMPLANT
GLIDEWIRE ADV .035X180CM (WIRE) ×1 IMPLANT
GLIDEWIRE STIFF .35X180X3 HYDR (WIRE) ×1 IMPLANT
GUIDEWIRE ADV .018X180CM (WIRE) ×1 IMPLANT
GUIDEWIRE AMPLATZ SHORT (WIRE) ×1 IMPLANT
MICROCATH PROGREAT 2.8F 110 CM (CATHETERS) ×2
NDL ENTRY 21GA 7CM ECHOTIP (NEEDLE) IMPLANT
NEEDLE ENTRY 21GA 7CM ECHOTIP (NEEDLE) ×2 IMPLANT
PACK ANGIOGRAPHY (CUSTOM PROCEDURE TRAY) ×2 IMPLANT
SET INTRO CAPELLA COAXIAL (SET/KITS/TRAYS/PACK) ×1 IMPLANT
SHEATH BRITE TIP 5FRX11 (SHEATH) ×1 IMPLANT
SYR EMBOSPHERE 500-700 2ML (Embolic) ×2 IMPLANT
SYR MEDRAD MARK 7 150ML (SYRINGE) ×1 IMPLANT
SYRINGE EMBOSPHERE 500-700 2ML (Embolic) IMPLANT
TUBING CONTRAST HIGH PRESS 72 (TUBING) ×1 IMPLANT
WIRE GUIDERIGHT .035X150 (WIRE) ×1 IMPLANT

## 2021-11-16 NOTE — Progress Notes (Signed)
Occupational Therapy Treatment Patient Details Name: Gary [name] MRN: [id number] DOB: March 22, 1956 Today's Date: 11/16/2021   History of present illness 66 y/o male admitted mid June secondary to SOB, COPD exacerbation, acute metabolic encephalopathy, acute on chronic hypoxic and hypercapnic respiratory failure in setting of acute decompensated HFpEF and AECOPD requiring intubation and mechanical ventilation. The pt is now s/p tracheostomy and PEG tube placement.   OT comments  Gary [name] is received in bed, on bedpan, experiencing multiple bouts of foul-smelling, dark-colored melena. Pt is highly anxious, and with O2 sats dropping into low 80s. Provided education, guidance on slow, deep breathing techniques, anxiety mgmt strategies. Assisted pt to participate in his care, encouraging him to use his UE to grab contralateral bedrails and roll to R and L sides for changing bedpans and linens. Assisted pt in changing into clean hospital gowns, which he was able to do with Min A. Max-Total A + 5 required to transfer pt to portable bed.    Recommendations for follow up therapy are one component of a multi-disciplinary discharge planning process, led by the attending physician.  Recommendations may be updated based on patient status, additional functional criteria and insurance authorization.    Follow Up Recommendations  Skilled nursing-short term rehab (<3 hours/day)    Assistance Recommended at Discharge    Patient can return home with the following  Two people to help with walking and/or transfers;Assistance with cooking/housework;Direct supervision/assist for medications management;Assist for transportation;Two people to help with bathing/dressing/bathroom   Equipment Recommendations       Recommendations for Other Services      Precautions / Restrictions Precautions Precautions: Fall Precaution Comments: IJ, G tube, trach Restrictions Weight Bearing Restrictions: No       Mobility  Bed Mobility Overal bed mobility: Needs Assistance Bed Mobility: Rolling Rolling: Min guard         General bed mobility comments: Pt able to use UE to roll in bed for removing/repositioning bedpan during multiple bouts of melena    Transfers Overall transfer level: Needs assistance                Lateral/Scoot Transfers: From elevated surface General transfer comment: +5 Max-Total A to transfer from bariatric bed to transport bed     Balance                                           ADL either performed or assessed with clinical judgement   ADL                   Upper Body Dressing : Minimal assistance;Bed level       Toilet Transfer: Maximal assistance;+2 for physical assistance Toilet Transfer Details (indicate cue type and reason): muliple transfers on and off bedpan; pt able to assist by using UE for rolling in bed Toileting- Clothing Manipulation and Hygiene: Total assistance;+2 for physical assistance;Bed level              Extremity/Trunk Assessment Upper Extremity Assessment Upper Extremity Assessment: Overall WFL for tasks assessed   Lower Extremity Assessment Lower Extremity Assessment: Generalized weakness        Vision       Perception     Praxis      Cognition Arousal/Alertness: Awake/alert Behavior During Therapy: WFL for tasks assessed/performed, Anxious Overall Cognitive Status: Within Functional Limits for tasks assessed  General Comments: Pt experiencing GI bleed, is fairly anxious        Exercises Other Exercises Other Exercises: Anxiety mgmt, breathing techniques    Shoulder Instructions       General Comments      Pertinent Vitals/ Pain       Pain Assessment Faces Pain Scale: Hurts even more Pain Location: abdomen Pain Descriptors / Indicators: Grimacing, Aching, Guarding Pain Intervention(s): Repositioned, Utilized relaxation  techniques  Home Living                                          Prior Functioning/Environment              Frequency  Min 2X/week        Progress Toward Goals  OT Goals(current goals can now be found in the care plan section)  Progress towards OT goals: OT to reassess next treatment  Acute Rehab OT Goals OT Goal Formulation: With patient Time For Goal Achievement: 11/27/21 Potential to Achieve Goals: Good  Plan Discharge plan remains appropriate;Frequency remains appropriate    Co-evaluation                 AM-PAC OT "6 Clicks" Daily Activity     Outcome Measure   Help from another person eating meals?: A Little Help from another person taking care of personal grooming?: A Lot Help from another person toileting, which includes using toliet, bedpan, or urinal?: A Lot Help from another person bathing (including washing, rinsing, drying)?: A Lot Help from another person to put on and taking off regular upper body clothing?: A Little Help from another person to put on and taking off regular lower body clothing?: A Lot 6 Click Score: 14    End of Session Equipment Utilized During Treatment: Oxygen  OT Visit Diagnosis: Other abnormalities of gait and mobility (R26.89);Muscle weakness (generalized) (M62.81)   Activity Tolerance Patient tolerated treatment well;Treatment limited secondary to medical complications (Comment)   Patient Left in bed;with nursing/sitter in room   Nurse Communication          Time: 8891-6945 OT Time Calculation (min): 26 min  Charges: OT General Charges $OT Visit: 1 Visit OT Treatments $Self Care/Home Management : 23-37 mins  Gary Lobo, PhD, MS, OTR/L 11/16/21, 3:11 PM

## 2021-11-16 NOTE — Progress Notes (Signed)
0810 Transport came to get patient got to elevators and stated bed couldn't fit in elevators patient returned to room.  Transport said they was going to have to get stretcher and come back to get patient.  1135 patient moved to regular bed to be sent to CTA and NM study.   Dontavia Brand, Tivis Ringer, RN

## 2021-11-16 NOTE — Plan of Care (Signed)
  Problem: Activity: Goal: Capacity to carry out activities will improve Outcome: Progressing   Problem: Cardiac: Goal: Ability to achieve and maintain adequate cardiopulmonary perfusion will improve Outcome: Progressing   Problem: Education: Goal: Knowledge of disease or condition will improve Outcome: Progressing
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
## 2021-11-16 NOTE — Progress Notes (Signed)
Report given to [place] RN on patient, patient currently off floor in CT & NM study. When patient finished will transport to new ICU room.  Wade Sigala, Tivis Ringer, RN

## 2021-11-16 NOTE — Progress Notes (Signed)
Patient seen 6 weeks ago by my partner Dr. Vicente Males.  At that time the patient was not having any signs of bleeding.  Now being contacted for the patient because he is having bright red blood per rectum.  The patient has not had a prep for colonoscopy and a vascular surgery recommendation was made in addition to a CT angiography. The patient had a positive site of bleeding at the sigmoid colon.  The patient is planned to go to the vascular surgery angiography suite for possible embolization. There is nothing at this time to do from a GI point of view since the patient has not cleared out and this is likely a diverticular bleed not amenable to any endoscopic intervention.

## 2021-11-16 NOTE — Progress Notes (Signed)
PT Cancellation Note  Patient Details Name: CASIMIRO LIENHARD MRN: [id number] DOB: [birth year]   Cancelled Treatment:    Reason Eval/Treat Not Completed: Other (comment).  Chart reviewed and attempted to see pt.  Nursing in room and pt is going to be having bloodwork and transferring to a new unit.  Will re-attempt at later date/time as medically appropriate.  Gwenlyn Saran, PT, DPT 11/16/21, 11:40 AM

## 2021-11-16 NOTE — Progress Notes (Addendum)
Patient refusing labs , states leave him alone , he cant get any sleep. Notified NP on call

## 2021-11-16 NOTE — Progress Notes (Signed)
Refusing to  prostat and tube NS 30cc flush: informed NP oncall

## 2021-11-16 NOTE — Op Note (Signed)
[facility]  Percutaneous Study/Intervention Procedural Note     Surgeon(s): Mudlogger: none  Pre-operative Diagnosis: 1. Lower GI bleed   Post-operative diagnosis:  Same  Procedure(s) Performed:             1.  Ultrasound guidance for vascular access right femoral artery             2.  Catheter placement into IMA and to sigmoid branches             3.  Aortogram and selective angiogram of the IMA and to sigmoid branches             4.  Microbead embolization of sigmoid branches x2 with 500-700  polyvinyl alcohol beads.             5.  StarClose closure device right femoral artery  Anesthesia: Continuous ECG pulse oximetry and cardiopulmonary monitoring was performed throughout the entire procedure by the interventional radiology nurse.  Parenteral Versed and fentanyl were utilized.  Total sedation time was 1 hour.          EBL: Less than 10 cc  Fluoro Time: 13.7 minutes  Contrast: 75 cc              Indications:  Patient is a 66 y.o.male with brisk lower GI bleeding with resultant anemia. The patient has a nuclear medicine study showing bleeding from the sigmoid colon this is also corroborated by the CT scan from this afternoon. The patient is brought in for angiography for further evaluation and potential treatment. Risks and benefits are discussed and informed consent is obtained  Procedure:  The patient was identified and appropriate procedural time out was performed.  The patient was then placed supine on the table and prepped and draped in the usual sterile fashion. Moderate conscious sedation was administered during a face to face encounter with the patient throughout the procedure with my supervision of the RN administering medicines and monitoring the patient's vital signs, pulse oximetry, telemetry and mental status throughout from the start of the procedure until the patient was taken to the recovery room.  Ultrasound was used  to evaluate the right common femoral artery.  It was patent .  A digital ultrasound image was acquired.  A Seldinger needle was used to access the right common femoral artery under direct ultrasound guidance and a permanent image was performed.  A 0.035 Amplatz was advanced without resistance and a 5Fr sheath was placed.  Pigtail catheter was placed into the aorta and an AP aortogram was performed. This demonstrated almost nothing given the patient's valve extremely large body habitus.  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  We transitioned to an RAO projection since the machine was unable to provide an image in the lateral projection.  I was able to identify the saccular component of the aneurysm as this is where the wire would curl and using this as a landmark with a V S1 catheter was able to cannulate the IMA. This demonstrated the left colic branch as well as the 2 sigmoid branches that are typically present. Based on  his continued bleeding and the nuclear medicine study I elected to treat this area with embolization. I initially advanced the Pro-Great microcatheter out the inferior sigmoid artery and instilled approximately 1 cc of 500  to 700 m beads.  I elected to use 1 cc in this location as this appeared to be the more dominant of the 2 branches. Angiogram following this showed the main vessels to be open with significantly less less filling. I then pulled back and cannulated the superior sigmoidal branch artery with the Pro-great microcatheter without difficulty. Selective imaging was performed which showed filling distally to the level of the colon.  Since this appeared to supply less of the sigmoid colon I instilled 0.5 cc of 500 to 700 m polyvinyl alcohol beads in this artery. Again, completion angiogram showed the main vessels to be open with less brisk filling. I  elected to terminate the procedure. The diagnostic catheter was removed. StarClose closure device was deployed in usual fashion with excellent hemostatic result. The patient was taken to the recovery room in stable condition having tolerated the procedure well.     Findings: Initial imaging of the aorta showed the saccular aneurysm noted on the CT scan but really did not yield any imaging of the IMA.  However using the saccular aneurysm as a landmark and in the oblique but not true lateral projection I was able to ensure that the V S1 catheter was facing anteriorly I was able to cannulate the IMA.  Hand-injection contrast demonstrated the IMA and its typical anatomy.  Given the body habitus of the patient as well as his breathing and movement no true blush was identified however given the volume of bleeding that he is experienced today and associating the nuclear study as well as the CT scan with this information I elected to treat.  Both the superior and inferior sigmoid artery were treated as described above with significant decrease in flow but there was persistent filling of the distal branches suggesting successful embolization.  Disposition: Patient was taken to the recovery room in stable condition having tolerated the procedure well.  Complications:  None  Hortencia Pilar 11/16/2021 6:09 PM   This note was created with Dragon Medical transcription system. Any errors in dictation are purely unintentional.
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
## 2021-11-16 NOTE — Progress Notes (Signed)
Called lab to make sure they seen repeat H/H order sat was informed they was short staffed and will be here soon.  1132 lab at bedside to drawn H/H.   Stavroula Rohde, Tivis Ringer, RN

## 2021-11-16 NOTE — Interval H&P Note (Signed)
History and Physical Interval Note:  11/16/2021 4:47 PM  Gary [name]  has presented today for surgery, with the diagnosis of GI bleed.  The various methods of treatment have been discussed with the patient and family. After consideration of risks, benefits and other options for treatment, the patient has consented to  Procedure(s): EMBOLIZATION (N/A) as a surgical intervention.  The patient's history has been reviewed, patient examined, no change in status, stable for surgery.  I have reviewed the patient's chart and labs.  Questions were answered to the patient's satisfaction.     Hortencia Pilar

## 2021-11-16 NOTE — Progress Notes (Signed)
Patient continues to have large maroon BMs pretty much continually. MD wants to move patient to ICU, called and spoke to charge and she stated she was reviewing and she would have assigned nurse call me back.   Nichlas Pitera, Tivis Ringer, RN

## 2021-11-16 NOTE — Plan of Care (Signed)
?  Problem: Education: ?Goal: Ability to demonstrate management of disease process will improve ?Outcome: Progressing ?  ?

## 2021-11-16 NOTE — Progress Notes (Addendum)
Xtra large maroon colored bowel movement noted. Oozy and multiple clots noted. NP, Sharion Settler made aware. Vitals are stable at this time.  New orders noted. Placed order for IV team consult to place 20 gauge above wrist for CT angio study.  0641- Patient had another large maroon BM. Patient states that he feels a little weak but that he think he has gotten it all out. B/p 113/67 at this time. HR 82. Paged Hassan Rowan NP about 2nd bowel movement. Stat orders noted.

## 2021-11-16 NOTE — Progress Notes (Addendum)
Cross Cover Patient again with melana. Hemodynamically stable Awaiting hgb result GI recommendation, if recurs, obtain CTA which has been ordered. GI services previously signed off, will need re-consulted  Protonix changed from 40 mg oral BID to IV

## 2021-11-16 NOTE — Progress Notes (Signed)
PROGRESS NOTE    Gary [name]  IEP:[id number] DOB: [birth year] DOA: 09/19/2021 PCP: [facility] [place]   Brief Narrative: This 66 y/o male with multiple medical problems causing chronic hypercapneic respiratory failure who has had admissions in the past for exacerbations of the same and mechanical ventilation presented for evaluation of dyspnea, weakness. It is unclear if the patient has been compliant with his medications and NIMV at home.  He was admitted by the hospitalists for a presumed COPD exacerbation in the ER, treated with bronchodilators and NIMV but his mental status and hypercarbia worsened despite those interventions so he required intubation.   6/6: Presented to ED.  Required intubation and mechanical ventilation in the ED.  PCCM admitted in ICU 6/7: Hold diuresis due to worsening Creatinine.  ENT consulted for Trach placement per family request 6/8: Pt continues to spike temps tmax 101.8 degrees F.  Flexiseal placed overnight draining foul smelling watery stool~Cdiff and GI panel negative 6/9: Fever resolved.  Negative for C- diff, GI panel still pending.  Creatinine slightly worsened, requiring low dose levophed (2 mcg). Holding diuresis 6/10: propofol discontinued due hypertriglyceridemia; failed SBT 6/11: Tracheal aspirate from 6/6 resulting with Pseudomonas, ABX changed to Cefepime. neurologically intact, awaiting Trach placement 6/12: Pt calmer today following addition of oral benzo's, narcotics, and Seroquel via tube.  Creatinine worsened with decreased UOP, holding diuresis, low threshold for Nephrology consult  6/13: Pt with worsening renal failure creatinine 4.15.  UOP overnight 25 ml.  Nephrology consulted plans to start pt on hemodialysis follow dialysis catheter placement Pt underwent hemodialysis without ultrafiltration 06/14: .  Plans for HD session today.  Agitation has improved significantly with narcotics and antianxiety medications per tube   06/16:  Tracheostomy canceled 06/15 due to hyperkalemia and tentatively rescheduled for 06/20  6/18 failure to wean from vent 06/20: ENT placed Tracheostomy (#7.0 XLT cuffed).  Developed mild epistaxis post unsuccessful attempt at NGT placement.  Later developed hematochezia/melena, Protonix gtt started and GI consulted. 06/21: Diuresed yesterday with limited results. Patient had softed blood pressures throughout the day and diuresis was held as a result. HD was performed by the patient clotted off the machine and was not able to be rinsed back. Repeat hemoglobin is above 8. 06/23: Pt remains mechanically intubated settings: FiO2 50%/PEEP 10.  CXR concerning for moderate right pleural effusion with associated right basilar atelectasis vs. infiltrate.  Will perform [place] Chest to determine if pt needs right sided thoracentesis  06/24: Remains on the ventilator, FiO2 requirements decreasing, respiratory culture with abundant gram-negative rods: [name], on Meropenem 06/25: Oxygen requirements down to 40%, chest x-ray shows improvement on dense right consolidation, Pseudomonas susceptibilities pending 06/25: Pt remains on the ventilator settings: PEEP 10 and FiO2 40%; will attempt to wean PEEP to 5 to perform SBT  06/28: Pt tolerated PS on 06/27.  Overnight pt had episodes of vomiting TF's suspect secondary to malposition of nasogastric tube.  Will consult IR for PEG tube placement if unable to place PEG tube pt will need postpyloric Dobbhoff   06/29: Pt tolerated PS 5/5 for over an hour yesterday, however due to fatigue placed back on PRVC.  Pt pulled NGT out several times overnight, therefore will not attempt to reinsert.  Plans for PEG tube placement per IR on 06/30.   06/30: PEG tube placed per IR  07/1: Pt tolerated trach collar _0 % throughout the day 07/2: Pt placed on ventilator overnight settings 16/5/35% tolerating well.  Will place pt on trach  collar today and place orders for PT/OT 7/3:  Hemodynamically stable.  Tolerating TC (has remained on TC since 7/2 @ 08:00), plan for speech therapy and PT/OT 7/4: Has maintained on trach collar for greater than 48 hours, tolerating Passy-Muir valve.  Plan to transfer out of ICU. SLP recs: dysphagia 1 (puree) w/ nectar liquid.  7/5-7/6: sodium and creatinine trending appropriately, Hgb stable. Low K repleted and stable. Was off vent qhs (orders d/c or missed?) and was restarted. Will need LTAC, TOC following  7/7: pulled out trach overnight. Confusion re: vent plan among staff and myself. I asked PCCM to reevluate him and verify plan so the team and the patient can eb on the same page, and will help TOC arrange appropriate placement.  7/8: I spoke personally w/ Dr Milon Dikes [facility]) and he confirms patient does NOT need ventilator support, unless of course develops distress, but last night the patient tolerated well without it. Plan to meet w/ family today when they visit, I spoke w/ his sister yesterday on the phone. No concerns on CBC/BMP: Cr tending down a bit, Hgb stable, Na slightly high but improving over past few days. He is approaching medically stable and may be appropriate for LTAC vs SNF soon  7/9: Several long discussions with patient and family about medical condition, behavioral issues.  See separate progress notes.  Psychiatry evaluated, agree that he has capacity, started SSRI. 7/10: Patient remains medically stable, awaiting placement.  Discussed his behavioral issues again and encouraged him to always bring any concerns to nursing staff or to me calmly and I am happy to listen, requested he maintain professionalism towards staff, he seems amenable. 7/11-7/14: goal OOB to chair, needing hoyer lift, pt reeducated several times about staying in bed 07/11 and has been compliant through 07/14. Finally got bariatric chair and hoyer lift 7/12 and was OOB 07/13.  D/c monitoring and sitter and transferred to [place] status 07/13. Awaiting placement.   7/15-17- working to reorient patient, but personality and coherence remain difficult. Stable for placement. Reminded patient not to verbally abuse staff 7/28 - rising O2 needs past couple of days, due to mucus plugging.  ABG stable, normal mentation.  Improved after suctioning. 8/2: Patient awaiting placement in a long-term acute care setting.. 8/3: Patient had multiple episodes of maroon-colored big bowel movements through out night.  H&H remains stable, vitals stable but patient at high risk for decompensation.  Patient is moved to ICU.  2 unit PRBC ordered for transfusion.  GI Dr. Lynnell Jude states given no prep patient cannot have a colonoscopy.  Nuclear scan positive for active GI bleeding in the proximal colon, vascular surgery Dr. Delana Meyer and ICU Dr. Lanney Gins notified.  Patient is a scheduled for IR embolization at 430 today.  Assessment & Plan:   Principal Problem:   Acute on chronic respiratory failure with hypoxia and hypercapnia ([name]) Active Problems:   Major depressive disorder, recurrent episode, moderate ([name])   Bloody stool   COPD with acute exacerbation ([name])   Acute metabolic encephalopathy   HTN (hypertension)   Acute on chronic diastolic congestive heart failure ([name])   Obesity, Class III, BMI 40-49.9 (morbid obesity) ([name])   Obstructive sleep apnea   Chronic kidney disease, stage 3a ([name])   HLD (hyperlipidemia)   Pressure injury of skin   Left foot blood blister - appreciate podiatry evaluation; He underwent removal of blister at bedside by podiatry on 7/21 -  Use prevalon boots to aid with pressure wounds going forward.  Acute on Chronic Hypoxic Hypercapnic Respiratory Failure Acute exacerbation COPD Pseudomonal PNA OSA/OHS - S/p  tracheostomy by ENT by 6/20. - Trach collar w/ ventilator support as needed / qhs but he does NOT need vent support overnight routinely per conversation w/ Dr Milon Dikes 07/08 - Completed 10 day course of ceftazidime for treatment of  Pseudomonas. - At this point, he is medically stable but placement is going to be an issue for him.   -  His behavioral issues have been discussed with him and his behavior has improved  - Monitor mental status closely and check blood gas PRN - Has OHS and was reliant on nightly bipap prior to trach 7/28 - He has rising O2 needs and desatting episodes to 50's this AM.  CXR with no PNA, only mild pulmonary vascular congestion, BNP normal. No fevers or cough.  ABG  with PCO2 75 (near baseline for him) and patient mentating well.  Continue to monitor. --PCCM updated --Added Brovana nebs BID --Suction for mucus plugging PRN --Transferred to Progressive 7/29 for closer monitoring 7/29-8/2: stable on 10 L/min, no reports of significant de-sat episodes.   Bloody bowel movements: Patient had multiple episodes of maroon-colored big bowel movements through out night.  H&H remains stable, vitals stable but patient at high risk for decompensation.  Patient is transferred to ICU.  2 unit PRBC ordered for transfusion.  GI Dr. Lynnell Jude states given no prep patient cannot have a colonoscopy.  Nuclear scan positive for active GI bleeding in the proximal colon, vascular surgery Dr. Delana Meyer and ICU Dr. Lanney Gins notified.  Patient is a scheduled for IR embolization at 430 today.  Continue to monitor H&H.   Bilateral hand and foot pain -  He has hx of gout, treated for a few days with colchicine last week.   Now having diffuse joint pains in hands and feet.   Pt and family  reports hx. of rheumatoid arthritis.   Uric acid level was normal last week ESR 48, CRP 4.2 - nonspecific Rheumatoid factor elevated 58.8 --Check anti-CCP --Continue prednisone, reduce to 20 mg  --Monitor for improvement   Acute decompensated HFpEF Hypertension - Echocardiogram 08/07/2021: LVEF 60 to 65%, grade 1 diastolic dysfunction, normal RV systolic function - Was on intermittent hemodialysis for volume removal. - has not needed HD  and has remained compensated. Cancelled permacath placement  - resumed home torsemide 7/27   AKI superimposed CKD stage IIIa > Resolved Hypokalemia > Resolved. Hypernatremia > Resolved. 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 Diff negative.   Acute Blood Loss Anemia - resolved  Nasopharyngeal trauma vs Upper GI bleed - Continue BID IV PPI - per GI, no need for scope    Hyperglycemia - CBG's achs; patient refusing at times - SSI  Acute Metabolic Encephalopathy in setting of CO2 Narcosis Sedation needs in setting of mechanical ventilation - CT Head 09/19/21: negative;  UDS negative - Treatment of hypercapnia as outlined above   Trach in place Gtube in place - No longer requiring vent support - PO intake has improved, SLP and dietary following  -Continue trach collar supplemental O2 to maintain sats >88% - Continues to eat well; G-tube will have to remain in place at least 3 months prior to removal     DVT prophylaxis: SCDs Code Status: Full code. Family Communication: (No family at bed side) Disposition Plan:   Status is: Inpatient Remains inpatient  appropriate because: Awaiting SNF placement. Barriers; remains on SNF with behavioral issues.    Consultants:  PCCU Nephrology ENT GI IR Psychiatry  Procedures:  10/03/21: tracheostomy placement 10/12/21: dialysis permacath placement  10/13/21: G-tube placed    Antimicrobials:  Anti-infectives (From admission, onward)    Start     Dose/Rate Route Frequency Ordered Stop   10/12/21 1200  cefTAZidime (FORTAZ) 2 g in sodium chloride 0.9 % 100 mL IVPB        2 g 200 mL/hr over 30 Minutes Intravenous 2 times daily 10/12/21 1122 10/17/21 0844   10/12/21 0417  ceFAZolin (ANCEF) IVPB 1 g/50 mL premix  Status:  Discontinued        1 g 100 mL/hr over 30 Minutes Intravenous 30 min pre-op 10/12/21 0417 10/16/21 0751    10/09/21 2000  ceFEPIme (MAXIPIME) 1 g in sodium chloride 0.9 % 100 mL IVPB  Status:  Discontinued        1 g 200 mL/hr over 30 Minutes Intravenous Every 24 hours 10/09/21 1404 10/09/21 1515   10/09/21 2000  cefTAZidime (FORTAZ) 1 g in sodium chloride 0.9 % 100 mL IVPB  Status:  Discontinued        1 g 200 mL/hr over 30 Minutes Intravenous Every 24 hours 10/09/21 1515 10/12/21 1122   10/06/21 1830  meropenem (MERREM) 500 mg in sodium chloride 0.9 % 100 mL IVPB  Status:  Discontinued        500 mg 200 mL/hr over 30 Minutes Intravenous Every 24 hours 10/06/21 1735 10/09/21 1403   10/06/21 1800  doxycycline (VIBRAMYCIN) 100 mg in sodium chloride 0.9 % 250 mL IVPB  Status:  Discontinued        100 mg 125 mL/hr over 120 Minutes Intravenous Every 12 hours 10/06/21 1657 10/08/21 0838   09/29/21 1000  ceFEPIme (MAXIPIME) 2 g in sodium chloride 0.9 % 100 mL IVPB        2 g 200 mL/hr over 30 Minutes Intravenous Every 12 hours 09/29/21 0812 09/30/21 2342   09/26/21 2200  ceFEPIme (MAXIPIME) 1 g in sodium chloride 0.9 % 100 mL IVPB  Status:  Discontinued        1 g 200 mL/hr over 30 Minutes Intravenous Every 24 hours 09/26/21 1154 09/29/21 0812   09/26/21 2000  ceFEPIme (MAXIPIME) 2 g in sodium chloride 0.9 % 100 mL IVPB  Status:  Discontinued        2 g 200 mL/hr over 30 Minutes Intravenous Every 12 hours 09/26/21 0809 09/26/21 1154   09/24/21 1200  ceFEPIme (MAXIPIME) 2 g in sodium chloride 0.9 % 100 mL IVPB  Status:  Discontinued        2 g 200 mL/hr over 30 Minutes Intravenous Every 8 hours 09/24/21 0950 09/26/21 0809   09/21/21 1430  azithromycin (ZITHROMAX) 500 mg in sodium chloride 0.9 % 250 mL IVPB        500 mg 250 mL/hr over 60 Minutes Intravenous Every 24 hours 09/21/21 1344 09/23/21 1543   09/21/21 1430  cefTRIAXone (ROCEPHIN) 2 g in sodium chloride 0.9 % 100 mL IVPB  Status:  Discontinued        2 g 200 mL/hr over 30 Minutes Intravenous Every 24 hours 09/21/21 1344 09/24/21 0922         Subjective: Patient was seen and examined at bedside.  Overnight events noted.   Patient had multiple maroon-colored big bloody bowel movements throughout the night.  H&H remained stable vitals stable.  Patient  appears anxious.  Patient is transferred to ICU.  GI vascular surgery and ICU notified.  Bleeding scan positive. Patient is going to have embolization today at 4:30 PM.   Objective: Vitals:   11/16/21 1337 11/16/21 1347 11/16/21 1407 11/16/21 1417  BP: 103/83 110/85 113/86 101/76  Pulse:      Resp:      Temp:      TempSrc:      SpO2:      Weight:      Height:        Intake/Output Summary (Last 24 hours) at 11/16/2021 1515 Last data filed at 11/16/2021 0448 Gross per 24 hour  Intake 720 ml  Output 2202 ml  Net -1482 ml   Filed Weights   11/13/21 0500 11/14/21 0400 11/16/21 0510  Weight: (!) 187.8 kg (!) 186.4 kg (!) 179.2 kg    Examination:  General exam: Appears comfortable, not in any acute distress.  Deconditioned, anxious. Respiratory system: Clear to auscultation. Respiratory effort normal.  Respiratory effort normal. HEENT : Trach with PMV in place, trach collar Cardiovascular system: S1 & S2 heard, RRR.  Regular rate and rhythm, no murmur.   Gastrointestinal system: Abdomen is soft, nontender, nondistended, PEG noted, normal bowel sounds. Central nervous system: Alert and oriented x 3. No focal neurological deficits. Extremities: No edema, no cyanosis, no clubbing. Skin: No rashes, lesions or ulcers Psychiatry: Judgement and insight appear normal. Mood & affect appropriate.     Data Reviewed: I have personally reviewed following labs and imaging studies  CBC: Recent Labs  Lab 11/10/21 1023 11/12/21 1439 11/13/21 1022 11/14/21 0350 11/16/21 0738 11/16/21 1132  WBC 7.0  --  7.3  --   --   --   HGB 9.8* 10.9* 9.9* 9.8* 9.8* 9.2*  HCT 36.8* 40.7 36.7* 36.8*  --  34.1*  MCV 83.6  --  82.5  --   --   --   PLT 153  --  169  --   --   --    Basic  Metabolic Panel: Recent Labs  Lab 11/12/21 0929 11/13/21 1022 11/14/21 0350 11/15/21 1118  NA 140 142 139 140  K 3.6 4.0 4.2 4.1  CL 98 96* 97* 97*  CO2 36* 37* 34* 36*  GLUCOSE 159* 101* 122* 124*  BUN 40* 39* 37* 30*  CREATININE 1.45* 1.29* 1.33* 1.19  CALCIUM 8.4* 8.5* 8.4* 8.4*   GFR: Estimated Creatinine Clearance: 108.6 mL/min (by C-G formula based on SCr of 1.19 mg/dL). Liver Function Tests: No results for input(s): "AST", "ALT", "ALKPHOS", "BILITOT", "PROT", "ALBUMIN" in the last 168 hours. No results for input(s): "LIPASE", "AMYLASE" in the last 168 hours. No results for input(s): "AMMONIA" in the last 168 hours. Coagulation Profile: Recent Labs  Lab 11/16/21 1132  INR 1.0   Cardiac Enzymes: No results for input(s): "CKTOTAL", "CKMB", "CKMBINDEX", "TROPONINI" in the last 168 hours. BNP (last 3 results) No results for input(s): "PROBNP" in the last 8760 hours. HbA1C: No results for input(s): "HGBA1C" in the last 72 hours. CBG: Recent Labs  Lab 11/10/21 2103 11/10/21 2119  GLUCAP 150* 148*   Lipid Profile: No results for input(s): "CHOL", "HDL", "LDLCALC", "Gary", "CHOLHDL", "LDLDIRECT" in the last 72 hours. Thyroid Function Tests: No results for input(s): "TSH", "T4TOTAL", "FREET4", "T3FREE", "THYROIDAB" in the last 72 hours. Anemia Panel: No results for input(s): "VITAMINB12", "FOLATE", "FERRITIN", "TIBC", "IRON", "RETICCTPCT" in the last 72 hours. Sepsis Labs: No results for input(s): "PROCALCITON", "LATICACIDVEN" in the last 168  hours.  No results found for this or any previous visit (from the past 240 hour(s)).   Radiology Studies: NM GI Blood Loss  Result Date: 11/16/2021 CLINICAL DATA:  Bright red blood per rectum. EXAM: NUCLEAR MEDICINE GASTROINTESTINAL BLEEDING SCAN TECHNIQUE: Sequential abdominal images were obtained following intravenous administration of Tc-13mlabeled red blood cells. RADIOPHARMACEUTICALS:  20.7 mCi Tc-94mertechnetate  in-vitro labeled red cells. COMPARISON:  CT 12/13/2021 FINDINGS: By 5 minutes of imaging, abnormal tagged red blood cells accumulation is noted in the LEFT lower quadrant. Tagged red blood cell accumulates over the next 50 minutes and remain centrally centered in the LEFT lower quadrant. The pattern of activity conforms to the expected location and shape of the proximal sigmoid colon. IMPRESSION: Active gastrointestinal bleeding into the proximal sigmoid colon. These results will be called to the ordering clinician or representative by the Radiologist Assistant, and communication documented in the PACS or ClFrontier Oil CorporationElectronically Signed   By: StSuzy Bouchard.D.   On: 11/16/2021 14:49    Scheduled Meds:  (feeding supplement) PROSource Plus  30 mL Oral TID PC & HS   sodium chloride   Intravenous Once   arformoterol  15 mcg Nebulization BID   vitamin C  500 mg Oral BID   atorvastatin  10 mg Oral Daily   budesonide (PULMICORT) nebulizer solution  0.5 mg Nebulization BID   cholecalciferol  2,000 Units Oral Daily   escitalopram  5 mg Oral Daily   free water  30 mL Per Tube Q4H   mouth rinse  15 mL Mouth Rinse 4 times per day   pantoprazole (PROTONIX) IV  40 mg Intravenous Q12H   torsemide  20 mg Oral Daily   traZODone  50 mg Oral QHS   Continuous Infusions:  sodium chloride Stopped (10/07/21 0542)     LOS: 58 days    Time spent: 50 Mins Critical care time spent 50 minutes in management and coordination of care.    PAShawna ClampMD [facility]   If 7PM-7AM, please contact night-coverage

## 2021-11-16 NOTE — Progress Notes (Signed)
Patient has had multiple very large maroon colored bowel movement noted, MD aware at bedside with patient. GI and Vascular consulted.  Grainne Knights, Tivis Ringer, RN

## 2021-11-16 NOTE — Progress Notes (Signed)
2 large maroon colored bowel movement noted. Oozy and multiple clots noted. VSS MD notified   Hollie Salk, RN

## 2021-11-17 ENCOUNTER — Inpatient Hospital Stay: Payer: Medicaid Other | Admitting: Certified Registered"

## 2021-11-17 ENCOUNTER — Encounter
Admission: EM | Disposition: A | Discharge status: Home / Self Care | Payer: Self-pay | Source: Home / Self Care | Attending: Internal Medicine

## 2021-11-17 ENCOUNTER — Encounter: Payer: Self-pay | Admitting: Vascular Surgery

## 2021-11-17 DIAGNOSIS — K921 Melena: Secondary | ICD-10-CM | POA: Diagnosis not present

## 2021-11-17 DIAGNOSIS — J9622 Acute and chronic respiratory failure with hypercapnia: Secondary | ICD-10-CM | POA: Diagnosis not present

## 2021-11-17 DIAGNOSIS — J9621 Acute and chronic respiratory failure with hypoxia: Secondary | ICD-10-CM | POA: Diagnosis not present

## 2021-11-17 HISTORY — PX: FLEXIBLE SIGMOIDOSCOPY: SHX5431

## 2021-11-17 LAB — CBC
HCT: 29.5 % — ABNORMAL LOW (ref 39.0–52.0)
Hemoglobin: 8.2 g/dL — ABNORMAL LOW (ref 13.0–17.0)
MCH: 22.3 pg — ABNORMAL LOW (ref 26.0–34.0)
MCHC: 27.8 g/dL — ABNORMAL LOW (ref 30.0–36.0)
MCV: 80.2 fL (ref 80.0–100.0)
Platelets: 202 10*3/uL (ref 150–400)
RBC: 3.68 MIL/uL — ABNORMAL LOW (ref 4.22–5.81)
RDW: 16.8 % — ABNORMAL HIGH (ref 11.5–15.5)
WBC: 11.8 10*3/uL — ABNORMAL HIGH (ref 4.0–10.5)
nRBC: 0 % (ref 0.0–0.2)

## 2021-11-17 LAB — HEMOGLOBIN AND HEMATOCRIT, BLOOD
HCT: 27.7 % — ABNORMAL LOW (ref 39.0–52.0)
HCT: 27.6 % — ABNORMAL LOW (ref 39.0–52.0)
Hemoglobin: 7.6 g/dL — ABNORMAL LOW (ref 13.0–17.0)
Hemoglobin: 7.9 g/dL — ABNORMAL LOW (ref 13.0–17.0)

## 2021-11-17 LAB — BASIC METABOLIC PANEL
Anion gap: 6 (ref 5–15)
BUN: 35 mg/dL — ABNORMAL HIGH (ref 8–23)
CO2: 32 mmol/L (ref 22–32)
Calcium: 8.2 mg/dL — ABNORMAL LOW (ref 8.9–10.3)
Chloride: 100 mmol/L (ref 98–111)
Creatinine, Ser: 1.42 mg/dL — ABNORMAL HIGH (ref 0.61–1.24)
GFR, Estimated: 55 mL/min — ABNORMAL LOW (ref >60)
Glucose, Bld: 113 mg/dL — ABNORMAL HIGH (ref 70–99)
Potassium: 4.8 mmol/L (ref 3.5–5.1)
Sodium: 138 mmol/L (ref 135–145)

## 2021-11-17 LAB — PREPARE RBC (CROSSMATCH)

## 2021-11-17 SURGERY — SIGMOIDOSCOPY, FLEXIBLE
Anesthesia: General

## 2021-11-17 MED ORDER — SODIUM CHLORIDE 0.9% IV SOLUTION: Freq: Once | INTRAVENOUS | Status: AC

## 2021-11-17 MED ORDER — SODIUM CHLORIDE 0.9 % IV SOLN: INTRAVENOUS | Status: DC

## 2021-11-17 MED ORDER — PROPOFOL 10 MG/ML IV BOLUS
INTRAVENOUS | Status: DC | PRN
  Administered 2021-11-17 (×2): 30 mg via INTRAVENOUS
  Administered 2021-11-17: 80 mg via INTRAVENOUS

## 2021-11-17 MED ORDER — PHENYLEPHRINE 80 MCG/ML (10ML) SYRINGE FOR IV PUSH (FOR BLOOD PRESSURE SUPPORT)
PREFILLED_SYRINGE | INTRAVENOUS | Status: DC | PRN
  Administered 2021-11-17 (×3): 160 ug via INTRAVENOUS

## 2021-11-17 NOTE — Progress Notes (Signed)
     Referral received for Gary [name] for goals of care discussion. Chart reviewed and updates received from staff.   The patient is currently on endoscopy for endoscopic evaluation for GI bleed.  Unsure of return time, could be later this afternoon.  Patient will likely be somnolent/sleepy from sedation during procedure.  I will attempt to follow-up with the patient if he is able to participate in conversations.  Otherwise we will need to defer reevaluation until Monday.  Thank you for your referral and allowing PMT to assist in [place] care.   Walden Field, NP Palliative Medicine Team Phone: [phone]  NO CHARGE

## 2021-11-17 NOTE — Progress Notes (Signed)
PT Cancellation Note  Patient Details Name: Gary [name] MRN: [id number] DOB: [birth year]   Cancelled Treatment:    Reason Eval/Treat Not Completed: Medical issues which prohibited therapy (Pt to vascular procedure previous day, received continue upon transfer orders. BP low this morning, trending down (80/21mHg), Hb also trending down @ 8.2.) Will defer treatment to later date/time to optimize pt's ability to participate and tolerate our services.   9:45 AM, 11/17/21 AEtta Grandchild PT, DPT Physical Therapist - [facility] [phone](ARyan     BNiceC 11/17/2021, 9:44 AM

## 2021-11-17 NOTE — Progress Notes (Signed)
OT Cancellation Note  Patient Details Name: JAMAREON SHIMEL MRN: [id number] DOB: 1955/06/08   Cancelled Treatment:    Reason Eval/Treat Not Completed: Patient at procedure or test/ unavailable. Continue at transfer orders received for pt following ICU t/f. Pt currently off the unit for procedure, will re-attempt for re-evaluation as pt available.   [name], M.S. OTR/L  11/17/21, 1:39 PM  ascom [phone]

## 2021-11-17 NOTE — Progress Notes (Signed)
At 0930 patient requested to be cleaned after having a bowel movement, patient was noted to have 4 large orange to grapefruit size bright red blood clots, Dr. Allen [name], Dr. Dwyane Dee, and Dr. Delana Meyer all notified. Dr. Allen [name] came to see patient and recommended a sigmoidoscopy.  Patient was made NPO and consent obtained for procedure.  Report given to receiving nurse at endoscopy.  At 1230 patient was switched over to dry mask nasal cannula at 14L and transferred to endoscopy in bed via this nurse and transport, patient tolerated move well.

## 2021-11-17 NOTE — Op Note (Signed)
[facility]enter[facility] Patient Name: Gary [name] Procedure Date: 11/17/2021 12:32 PM MRN: [id number] Account #: [id number] Date of Birth: [birth year] Admit Type: Outpatient Age: 66 Room: [facility] ENDO ROOM 4 Gender: Male Note Status: Finalized Instrument Name: Upper Endoscope [id number] Procedure:             Flexible Sigmoidoscopy Indications:           Hematochezia Providers:             Lucilla Lame MD, MD Medicines:             Propofol per Anesthesia Complications:         No immediate complications. Procedure:             Pre-Anesthesia Assessment:                        - Prior to the procedure, a History and Physical was                         performed, and patient medications and allergies were                         reviewed. The patient's tolerance of previous                         anesthesia was also reviewed. The risks and benefits                         of the procedure and the sedation options and risks                         were discussed with the patient. All questions were                         answered, and informed consent was obtained. Prior                         Anticoagulants: The patient has taken no previous                         anticoagulant or antiplatelet agents. ASA Grade                         Assessment: III - A patient with severe systemic                         disease. After reviewing the risks and benefits, the                         patient was deemed in satisfactory condition to                         undergo the procedure.                        After obtaining informed consent, the scope was passed                         under direct vision. The Endoscope was introduced  through the anus and advanced to the the sigmoid                         colon. The flexible sigmoidoscopy was accomplished                         without difficulty. The patient tolerated the                          procedure well. The quality of the bowel preparation                         was poor. Findings:      The perianal and digital rectal examinations were normal.      Hematin (altered blood/coffee-ground-like material) was found in the       rectum, in the recto-sigmoid colon and in the sigmoid colon.      Multiple small-mouthed diverticula were found in the sigmoid colon. Impression:            - Preparation of the colon was poor.                        - Blood in the rectum, in the recto-sigmoid colon and                         in the sigmoid colon.                        - Diverticulosis in the sigmoid colon.                        - No specimens collected. Recommendation:        - Return patient to ICU for ongoing care.                        - If bleeding continues and can not be controled with                         vascular intervention, then surgical resection would                         be an option. Procedure Code(s):     --- Professional ---                        [phone], Sigmoidoscopy, flexible; diagnostic, including                         collection of specimen(s) by brushing or washing, when                         performed (separate procedure) Diagnosis Code(s):     --- Professional ---                        K92.1, Melena (includes Hematochezia)                        K92.2, Gastrointestinal hemorrhage, unspecified CPT copyright 2019 [employer]. All rights reserved. The codes documented in this  report are preliminary and upon coder review may  be revised to meet current compliance requirements. Lucilla Lame MD, MD 11/17/2021 1:38:33 PM This report has been signed electronically. Number of Addenda: 0 Note Initiated On: 11/17/2021 12:32 PM Total Procedure Duration: 0 hours 9 minutes 1 second  Estimated Blood Loss:  Estimated blood loss: none. Estimated blood loss: none.      [facility]

## 2021-11-17 NOTE — Transfer of Care (Signed)
Immediate Anesthesia Transfer of Care Note  Patient: Gary [name]  Procedure(s) Performed: FLEXIBLE SIGMOIDOSCOPY  Patient Location: Endoscopy Unit  Anesthesia Type:General  Level of Consciousness: awake and alert and oriented  Airway & Oxygen Therapy: Patient Spontanous Breathing and Patient connected to T-piece oxygen  Post-op Assessment: Report given to RN, Post -op Vital signs reviewed and stable and Patient moving all extremities  Post vital signs: Reviewed and stable  Last Vitals:  Vitals Value Taken Time  BP 95/73 11/17/21 1340  Temp    Pulse 88 11/17/21 1340  Resp    SpO2 100 % 11/17/21 1340  Vitals shown include unvalidated device data.  Last Pain:  Vitals:   11/17/21 1245  TempSrc: Tympanic  PainSc: 0-No pain      Patients Stated Pain Goal: 2 (59/74/71 8550)  Complications: No notable events documented.
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
## 2021-11-17 NOTE — Plan of Care (Signed)
Continuing with plan of care. 
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
## 2021-11-17 NOTE — Anesthesia Postprocedure Evaluation (Signed)
Anesthesia Post Note  Patient: Gary [name]  Procedure(s) Performed: [place]  Patient location during evaluation: PACU Anesthesia Type: General Level of consciousness: awake Pain management: pain level controlled Vital Signs Assessment: post-procedure vital signs reviewed and stable Respiratory status: spontaneous breathing and patient connected to face mask oxygen Cardiovascular status: stable Anesthetic complications: no   No notable events documented.   Last Vitals:  Vitals:   11/17/21 1340 11/17/21 1346  BP: 95/73 98/75  Pulse: 89 89  Resp:    Temp:  (!) 35.6 C  SpO2: 98% 96%    Last Pain:  Vitals:   11/17/21 1346  TempSrc: Temporal  PainSc:                  VAN STAVEREN,Daichi Moris

## 2021-11-17 NOTE — Progress Notes (Addendum)
PROGRESS NOTE    TRIG MCBRYAR  IEP:[id number] DOB: [birth year] DOA: 09/19/2021 PCP: [facility] [place]   Brief Narrative: This 66 y/o male with multiple medical problems causing chronic hypercapneic respiratory failure who has had admissions in the past for exacerbations of the same and mechanical ventilation presented for evaluation of dyspnea, weakness. It is unclear if the patient has been compliant with his medications and NIMV at home.  He was admitted by the hospitalists for a presumed COPD exacerbation in the ER, treated with bronchodilators and NIMV but his mental status and hypercarbia worsened despite those interventions so he required intubation.   6/6: Presented to ED.  Required intubation and mechanical ventilation in the ED.  PCCM admitted in ICU 6/7: Hold diuresis due to worsening Creatinine.  ENT consulted for Trach placement per family request 6/8: Pt continues to spike temps tmax 101.8 degrees F.  Flexiseal placed overnight draining foul smelling watery stool~Cdiff and GI panel negative 6/9: Fever resolved.  Negative for C- diff, GI panel still pending.  Creatinine slightly worsened, requiring low dose levophed (2 mcg). Holding diuresis 6/10: propofol discontinued due hypertriglyceridemia; failed SBT 6/11: Tracheal aspirate from 6/6 resulting with Pseudomonas, ABX changed to Cefepime. neurologically intact, awaiting Trach placement 6/12: Pt calmer today following addition of oral benzo's, narcotics, and Seroquel via tube.  Creatinine worsened with decreased UOP, holding diuresis, low threshold for Nephrology consult  6/13: Pt with worsening renal failure creatinine 4.15.  UOP overnight 25 ml.  Nephrology consulted plans to start pt on hemodialysis follow dialysis catheter placement Pt underwent hemodialysis without ultrafiltration 06/14: .  Plans for HD session today.  Agitation has improved significantly with narcotics and antianxiety medications per tube   06/16:  Tracheostomy canceled 06/15 due to hyperkalemia and tentatively rescheduled for 06/20  6/18 failure to wean from vent 06/20: ENT placed Tracheostomy (#7.0 XLT cuffed).  Developed mild epistaxis post unsuccessful attempt at NGT placement.  Later developed hematochezia/melena, Protonix gtt started and GI consulted. 06/21: Diuresed yesterday with limited results. Patient had softed blood pressures throughout the day and diuresis was held as a result. HD was performed by the patient clotted off the machine and was not able to be rinsed back. Repeat hemoglobin is above 8. 06/23: Pt remains mechanically intubated settings: FiO2 50%/PEEP 10.  CXR concerning for moderate right pleural effusion with associated right basilar atelectasis vs. infiltrate.  Will perform [place] Chest to determine if pt needs right sided thoracentesis  06/24: Remains on the ventilator, FiO2 requirements decreasing, respiratory culture with abundant gram-negative rods: [name], on Meropenem 06/25: Oxygen requirements down to 40%, chest x-ray shows improvement on dense right consolidation, Pseudomonas susceptibilities pending 06/25: Pt remains on the ventilator settings: PEEP 10 and FiO2 40%; will attempt to wean PEEP to 5 to perform SBT  06/28: Pt tolerated PS on 06/27.  Overnight pt had episodes of vomiting TF's suspect secondary to malposition of nasogastric tube.  Will consult IR for PEG tube placement if unable to place PEG tube pt will need postpyloric Dobbhoff   06/29: Pt tolerated PS 5/5 for over an hour yesterday, however due to fatigue placed back on PRVC.  Pt pulled NGT out several times overnight, therefore will not attempt to reinsert.  Plans for PEG tube placement per IR on 06/30.   06/30: PEG tube placed per IR  07/1: Pt tolerated trach collar _0 % throughout the day 07/2: Pt placed on ventilator overnight settings 16/5/35% tolerating well.  Will place pt on trach  collar today and place orders for PT/OT 7/3:  Hemodynamically stable.  Tolerating TC (has remained on TC since 7/2 @ 08:00), plan for speech therapy and PT/OT 7/4: Has maintained on trach collar for greater than 48 hours, tolerating Passy-Muir valve.  Plan to transfer out of ICU. SLP recs: dysphagia 1 (puree) w/ nectar liquid.  7/5-7/6: sodium and creatinine trending appropriately, Hgb stable. Low K repleted and stable. Was off vent qhs (orders d/c or missed?) and was restarted. Will need LTAC, TOC following  7/7: pulled out trach overnight. Confusion re: vent plan among staff and myself. I asked PCCM to reevluate him and verify plan so the team and the patient can eb on the same page, and will help TOC arrange appropriate placement.  7/8: I spoke personally w/ Dr Milon Dikes [facility]) and he confirms patient does NOT need ventilator support, unless of course develops distress, but last night the patient tolerated well without it. Plan to meet w/ family today when they visit, I spoke w/ his sister yesterday on the phone. No concerns on CBC/BMP: Cr tending down a bit, Hgb stable, Na slightly high but improving over past few days. He is approaching medically stable and may be appropriate for LTAC vs SNF soon  7/9: Several long discussions with patient and family about medical condition, behavioral issues.  See separate progress notes.  Psychiatry evaluated, agree that he has capacity, started SSRI. 7/10: Patient remains medically stable, awaiting placement.  Discussed his behavioral issues again and encouraged him to always bring any concerns to nursing staff or to me calmly and I am happy to listen, requested he maintain professionalism towards staff, he seems amenable. 7/11-7/14: goal OOB to chair, needing hoyer lift, pt reeducated several times about staying in bed 07/11 and has been compliant through 07/14. Finally got bariatric chair and hoyer lift 7/12 and was OOB 07/13.  D/c monitoring and sitter and transferred to [place] status 07/13. Awaiting placement.   7/15-17- working to reorient patient, but personality and coherence remain difficult. Stable for placement. Reminded patient not to verbally abuse staff 7/28 - rising O2 needs past couple of days, due to mucus plugging.  ABG stable, normal mentation.  Improved after suctioning. 8/2: Patient awaiting placement in a long-term acute care setting.. 8/3: Patient had multiple episodes of maroon-colored big bowel movements through out night.  H&H remains stable, vitals stable but patient at high risk for decompensation.  Patient transferred to ICU.  2 unit PRBC given, GI Dr. Lynnell Jude notified,  Nuclear scan positive for active GI bleeding in the proximal colon, vascular surgery Dr. Delana Meyer and ICU Dr. Lanney Gins notified.  Patient underwent embolization of the bleeding artery. 8/4: Patient continues to have bright red blood per rectum.  Patient underwent flex sigmoidoscopy showed blood in the rectum, rectosigmoid colon, diverticulosis.  Hemoglobin dropped to 7.6.  Transfused 2 unit PRBC.  If patient continues to bleed may require general surgery evaluation.  Assessment & Plan:   Principal Problem:   Acute on chronic respiratory failure with hypoxia and hypercapnia ([name]) Active Problems:   Major depressive disorder, recurrent episode, moderate ([name])   Bloody stool   COPD with acute exacerbation ([name])   Acute metabolic encephalopathy   HTN (hypertension)   Acute on chronic diastolic congestive heart failure ([name])   Obesity, Class III, BMI 40-49.9 (morbid obesity) ([place])   Obstructive sleep apnea   Chronic kidney disease, stage 3a ([name])   HLD (hyperlipidemia)   Pressure injury of skin   Hematochezia  Bloody  bowel movements: Patient continues to have maroon-colored big bowel movements.   Nuclear scan positive for active GI bleeding in the proximal colon. Vascular surgery Dr. Delana Meyer has attempted embolization of the bleeding artery. Dr. Allen [name] with GI attempted flex sigmoidoscopy which shows blood in the  sigmoid colon, rectosigmoid, diverticulum. H&H dropped.  Transfuse 2 unit PRBC. If patient continues to bleed may require general surgery evaluation.  Left foot blood blister - appreciate podiatry evaluation; He underwent removal of blister at bedside by podiatry on 7/21 -  Use prevalon boots to aid with pressure wounds going forward.   Acute on Chronic Hypoxic Hypercapnic Respiratory Failure Acute exacerbation COPD Pseudomonal PNA OSA/OHS - S/p  tracheostomy by ENT by 6/20. - Trach collar w/ ventilator support as needed / qhs but he does NOT need vent support overnight routinely per conversation w/ Dr Milon Dikes 07/08 - Completed 10 day course of ceftazidime for treatment of Pseudomonas. - At this point, he is medically stable but placement is going to be an issue for him.   -  His behavioral issues have been discussed with him and his behavior has improved  - Monitor mental status closely and check blood gas PRN - Has OHS and was reliant on nightly bipap prior to trach 7/29-8/2: stable on 10 L/min, no reports of significant de-sat episodes.     Bilateral hand and foot pain -  He has hx of gout, treated for a few days with colchicine last week.   Now having diffuse joint pains in hands and feet.   Pt and family  reports hx. of rheumatoid arthritis.   Uric acid level was normal last week ESR 48, CRP 4.2 - nonspecific Rheumatoid factor elevated 58.8 --Check anti-CCP --Continue prednisone, reduce to 20 mg  --Monitor for improvement   Acute decompensated HFpEF Hypertension - Echocardiogram 08/07/2021: LVEF 60 to 79%, grade 1 diastolic dysfunction, normal RV systolic function - Was on intermittent hemodialysis for volume removal. - has not needed HD and has remained compensated. Cancelled permacath placement  - resumed home torsemide 7/27   AKI superimposed CKD stage IIIa > Resolved Hypokalemia > Resolved. Hypernatremia > Resolved. 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 -  resolved  Pseudomonas Pneumonia  - Completed 10 day course of ceftazidime (started on 06/26) - C. Diff negative.   Acute Blood Loss Anemia - resolved  Nasopharyngeal trauma vs Upper GI bleed - Continue BID IV PPI - per GI, no need for scope    Hyperglycemia - CBG's achs; patient refusing at times - SSI  Acute Metabolic Encephalopathy in setting of CO2 Narcosis Sedation needs in setting of mechanical ventilation - CT Head 09/19/21: negative;  UDS negative - Treatment of hypercapnia as outlined above   Trach in place Gtube in place - No longer requiring vent support - PO intake has improved, SLP and dietary following  -Continue trach collar supplemental O2 to maintain sats >88% - Continues to eat well; G-tube will have to remain in place at least 3 months prior to removal     DVT prophylaxis: SCDs Code Status: Full code. Family Communication: (No family at bed side) Disposition Plan:   Status is: Inpatient Remains inpatient appropriate because: Awaiting SNF placement. Barriers; remains on SNF with behavioral issues.    Consultants:  PCCU Nephrology ENT GI IR Psychiatry  Procedures:  10/03/21: tracheostomy placement 10/12/21: dialysis permacath placement  10/13/21: G-tube placed    Antimicrobials:  Anti-infectives (From admission, onward)    Start     Dose/Rate Route  Frequency Ordered Stop   11/16/21 1651  ceFAZolin (ANCEF) IVPB 1 g/50 mL premix        over 30 Minutes  Continuous PRN 11/16/21 1651 11/16/21 1837   11/16/21 1559  ceFAZolin (ANCEF) IVPB 2g/100 mL premix        2 g 200 mL/hr over 30 Minutes Intravenous 30 min pre-op 11/16/21 1559 11/16/21 1700   10/12/21 1200  cefTAZidime (FORTAZ) 2 g in sodium chloride 0.9 % 100 mL IVPB        2 g 200 mL/hr over 30 Minutes Intravenous 2 times daily 10/12/21 1122 10/17/21 0844   10/12/21 0417  ceFAZolin (ANCEF) IVPB 1 g/50 mL premix  Status:  Discontinued        1 g 100 mL/hr over 30 Minutes Intravenous 30  min pre-op 10/12/21 0417 10/16/21 0751   10/09/21 2000  ceFEPIme (MAXIPIME) 1 g in sodium chloride 0.9 % 100 mL IVPB  Status:  Discontinued        1 g 200 mL/hr over 30 Minutes Intravenous Every 24 hours 10/09/21 1404 10/09/21 1515   10/09/21 2000  cefTAZidime (FORTAZ) 1 g in sodium chloride 0.9 % 100 mL IVPB  Status:  Discontinued        1 g 200 mL/hr over 30 Minutes Intravenous Every 24 hours 10/09/21 1515 10/12/21 1122   10/06/21 1830  meropenem (MERREM) 500 mg in sodium chloride 0.9 % 100 mL IVPB  Status:  Discontinued        500 mg 200 mL/hr over 30 Minutes Intravenous Every 24 hours 10/06/21 1735 10/09/21 1403   10/06/21 1800  doxycycline (VIBRAMYCIN) 100 mg in sodium chloride 0.9 % 250 mL IVPB  Status:  Discontinued        100 mg 125 mL/hr over 120 Minutes Intravenous Every 12 hours 10/06/21 1657 10/08/21 0838   09/29/21 1000  ceFEPIme (MAXIPIME) 2 g in sodium chloride 0.9 % 100 mL IVPB        2 g 200 mL/hr over 30 Minutes Intravenous Every 12 hours 09/29/21 0812 09/30/21 2342   09/26/21 2200  ceFEPIme (MAXIPIME) 1 g in sodium chloride 0.9 % 100 mL IVPB  Status:  Discontinued        1 g 200 mL/hr over 30 Minutes Intravenous Every 24 hours 09/26/21 1154 09/29/21 0812   09/26/21 2000  ceFEPIme (MAXIPIME) 2 g in sodium chloride 0.9 % 100 mL IVPB  Status:  Discontinued        2 g 200 mL/hr over 30 Minutes Intravenous Every 12 hours 09/26/21 0809 09/26/21 1154   09/24/21 1200  ceFEPIme (MAXIPIME) 2 g in sodium chloride 0.9 % 100 mL IVPB  Status:  Discontinued        2 g 200 mL/hr over 30 Minutes Intravenous Every 8 hours 09/24/21 0950 09/26/21 0809   09/21/21 1430  azithromycin (ZITHROMAX) 500 mg in sodium chloride 0.9 % 250 mL IVPB        500 mg 250 mL/hr over 60 Minutes Intravenous Every 24 hours 09/21/21 1344 09/23/21 1543   09/21/21 1430  cefTRIAXone (ROCEPHIN) 2 g in sodium chloride 0.9 % 100 mL IVPB  Status:  Discontinued        2 g 200 mL/hr over 30 Minutes Intravenous Every 24  hours 09/21/21 1344 09/24/21 0922        Subjective: Patient was seen and examined at bedside.  Overnight events noted.   Patient reports feeling better,  he underwent embolization of the bleeding artery 8/3.  He states feeling better. He continues to have bright blood in the stools.  GI attempted flex sigmoidoscopy today,  found to have blood in the sigmoid colon, diverticulosis. Hb has dropped to 7.4, going to get 2 units of PRBCs.   Objective: Vitals:   11/17/21 1245 11/17/21 1339 11/17/21 1340 11/17/21 1346  BP: 91/65 95/73 95/73  98/75  Pulse: 92 89 89 89  Resp:      Temp: (!) 96.6 F (35.9 C)   (!) 96.1 F (35.6 C)  TempSrc: Tympanic   Temporal  SpO2:  99% 98% 96%  Weight:      Height:        Intake/Output Summary (Last 24 hours) at 11/17/2021 1505 Last data filed at 11/17/2021 1333 Gross per 24 hour  Intake 955.11 ml  Output 375 ml  Net 580.11 ml   Filed Weights   11/14/21 0400 11/16/21 0510 11/17/21 0316  Weight: (!) 186.4 kg (!) 179.2 kg (!) 180.5 kg    Examination:  General exam: Appears comfortable, not in any acute distress, morbidly obese, deconditioned, anxious.01/17/22 Respiratory system: CTA bilaterally, respiratory effort normal, HEENT : Trach with PMV in place, trach collar Cardiovascular system: S1 & S2 heard, RRR.  Regular rate and rhythm, no murmur.   Gastrointestinal system: Abdomen is soft, non tender, non distended, PEG noted, normal bowel sounds. Central nervous system: Alert and oriented x 3. No focal neurological deficits. Extremities: No edema, no cyanosis, no clubbing. Skin: No rashes, lesions or ulcers Psychiatry: Judgement and insight appear normal. Mood & affect appropriate.     Data Reviewed: I have personally reviewed following labs and imaging studies  CBC: Recent Labs  Lab 11/13/21 1022 11/14/21 0350 11/16/21 0738 11/16/21 1132 11/16/21 1925 11/17/21 0629 11/17/21 1151  WBC 7.3  --   --   --   --  11.8*  --   HGB 9.9* 9.8* 9.8*  9.2* 9.3* 8.2* 7.6*  HCT 36.7* 36.8*  --  34.1* 33.9* 29.5* 27.7*  MCV 82.5  --   --   --   --  80.2  --   PLT 169  --   --   --   --  202  --    Basic Metabolic Panel: Recent Labs  Lab 11/12/21 0929 11/13/21 1022 11/14/21 0350 11/15/21 1118 11/17/21 0629  NA 140 142 139 140 138  K 3.6 4.0 4.2 4.1 4.8  CL 98 96* 97* 97* 100  CO2 36* 37* 34* 36* 32  GLUCOSE 159* 101* 122* 124* 113*  BUN 40* 39* 37* 30* 35*  CREATININE 1.45* 1.29* 1.33* 1.19 1.42*  CALCIUM 8.4* 8.5* 8.4* 8.4* 8.2*   GFR: Estimated Creatinine Clearance: 91.4 mL/min (A) (by C-G formula based on SCr of 1.42 mg/dL (H)). Liver Function Tests: No results for input(s): "AST", "ALT", "ALKPHOS", "BILITOT", "PROT", "ALBUMIN" in the last 168 hours. No results for input(s): "LIPASE", "AMYLASE" in the last 168 hours. No results for input(s): "AMMONIA" in the last 168 hours. Coagulation Profile: Recent Labs  Lab 11/16/21 1132  INR 1.0   Cardiac Enzymes: No results for input(s): "CKTOTAL", "CKMB", "CKMBINDEX", "TROPONINI" in the last 168 hours. BNP (last 3 results) No results for input(s): "PROBNP" in the last 8760 hours. HbA1C: No results for input(s): "HGBA1C" in the last 72 hours. CBG: Recent Labs  Lab 11/10/21 2103 11/10/21 2119  GLUCAP 150* 148*   Lipid Profile: No results for input(s): "CHOL", "HDL", "LDLCALC", "TRIG", "CHOLHDL", "LDLDIRECT" in the last 72 hours. Thyroid Function Tests:  No results for input(s): "TSH", "T4TOTAL", "FREET4", "T3FREE", "THYROIDAB" in the last 72 hours. Anemia Panel: No results for input(s): "VITAMINB12", "FOLATE", "FERRITIN", "TIBC", "IRON", "RETICCTPCT" in the last 72 hours. Sepsis Labs: No results for input(s): "PROCALCITON", "LATICACIDVEN" in the last 168 hours.  No results found for this or any previous visit (from the past 240 hour(s)).   Radiology Studies: PERIPHERAL VASCULAR CATHETERIZATION  Result Date: 11/16/2021 See surgical note for result.  CT Angio Abd/Pel  w/ and/or w/o  Result Date: 11/16/2021 CLINICAL DATA:  Bloody stools.  Evaluate for GI bleed. EXAM: CTA ABDOMEN AND PELVIS WITHOUT AND WITH CONTRAST TECHNIQUE: Multidetector CT imaging of the abdomen and pelvis was performed using the standard protocol during bolus administration of intravenous contrast. Multiplanar reconstructed images and MIPs were obtained and reviewed to evaluate the vascular anatomy. RADIATION DOSE REDUCTION: This exam was performed according to the departmental dose-optimization program which includes automated exposure control, adjustment of the mA and/or kV according to patient size and/or use of iterative reconstruction technique. CONTRAST:  12mL OMNIPAQUE IOHEXOL 350 MG/ML SOLN COMPARISON:  Nuclear medicine bleeding exam 11/16/2021. 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 The abdominal aorta measures up to 3.3 cm at this level and the saccular component roughly measures 2.2 x 2.3 x 2.6 cm. This saccular aneurysm appears to have enlarged since 2015 but was poorly characterized on the previous examination. Saccular aneurysm is located above the inferior mesenteric artery. There is mild irregularity in the abdominal aorta distal to the IMA. Celiac: Common origin for the celiac trunk and SMA. Celiac trunk origin is widely patent. Main branch vessels are patent. SMA: Patent without evidence of aneurysm, dissection, vasculitis or significant stenosis. Renals: Both renal arteries are patent without evidence of aneurysm, dissection, vasculitis, fibromuscular dysplasia or significant stenosis. IMA: Patent without evidence of aneurysm, dissection, vasculitis or significant stenosis. Inflow: Patent without evidence of aneurysm, dissection, vasculitis or significant stenosis. Proximal Outflow: Proximal femoral arteries are patent bilaterally. Veins: No gross abnormality to the IVC  or iliac veins. Main portal venous system appears to be patent with significant streak artifact in the abdomen. Review of the MIP images confirms the above findings. NON-VASCULAR Lower chest: Few densities at the posterior left lung base are suggestive for atelectasis or scarring. No large pleural effusions. Hepatobiliary: Streak artifact in the abdomen limits evaluation. No gross abnormality to the liver or gallbladder. Pancreas: Unremarkable. No pancreatic ductal dilatation or surrounding inflammatory changes. Spleen: Normal in size. Limited evaluation due to streak artifact in the abdomen. Adrenals/Urinary Tract: 1.8 cm low-density nodule in the right adrenal gland is suggestive for a benign adenoma based on the Hounsfield units on the precontrast images. Normal appearance of left adrenal gland. Negative for kidney stones or hydronephrosis. Probable cyst in left kidney upper pole but poorly characterized due to streak artifact. Fluid in the urinary bladder without gross abnormality. Stomach/Bowel: Limited evaluation for GI bleeding on this examination due to extensive streak artifact throughout the abdomen and pelvis. An area of active GI bleeding is not confidently identified. Patient has probably had a right hemicolectomy. Moderate amount of stool in the colon. There is redundancy and diverticula involving the sigmoid colon. Difficult to exclude inflammation in the sigmoid colon. There is a balloon retention gastrostomy tube in the distal stomach. No significant small bowel distension. Lymphatic: Prominent lymph node adjacent to the sigmoid colon on sequence 6 image 68 measures 1.1 cm.  Slightly prominent lymph node anterior to the distal aorta on sequence 6, image 56 measures 1.1 cm in the short axis. No other significant lymph node enlargement in the abdomen or pelvis. Reproductive: Limited evaluation of the prostate. Other: There is no evidence for free fluid. Negative for free air. Periumbilical ventral  hernia containing fat. Multiple surgical clips in the right abdomen compatible with previous bowel surgery Musculoskeletal: No acute bone abnormality. IMPRESSION: VASCULAR 1. 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 Abdominal aorta at this level measures up to 3.3 cm. Saccular component measures up to 2.6 cm. Recommend vascular consultation. Reference: J Vasc Surg 6979;48:0-16. 2. Main visceral arteries are patent. Common trunk for the celiac trunk and SMA which is a normal variant. NON-VASCULAR 1. Limited evaluation for GI bleeding due to extensive streak artifact in the abdomen and pelvis. No clear evidence for active GI bleeding on this examination. 2. Redundancy in the sigmoid colon with diverticula. Difficult to exclude inflammatory changes and wall thickening involving the sigmoid colon. Patient would likely benefit from follow-up imaging or colonoscopy in this area. 3. Slightly enlarged lymph nodes in the abdomen are indeterminate. Lymph nodes may be associated with the sigmoid colon and could be reactive. 4. Gastrostomy tube in place. Electronically Signed   By: Markus Daft M.D.   On: 11/16/2021 17:43   NM GI Blood Loss  Result Date: 11/16/2021 CLINICAL DATA:  Bright red blood per rectum. EXAM: NUCLEAR MEDICINE GASTROINTESTINAL BLEEDING SCAN TECHNIQUE: Sequential abdominal images were obtained following intravenous administration of Tc-49m labeled red blood cells. RADIOPHARMACEUTICALS:  20.7 mCi Tc-59m pertechnetate in-vitro labeled red cells. COMPARISON:  CT 12/13/2021 FINDINGS: By 5 minutes of imaging, abnormal tagged red blood cells accumulation is noted in the LEFT lower quadrant. Tagged red blood cell accumulates over the next 50 minutes and remain centrally centered in the LEFT lower quadrant. The pattern of activity conforms to the expected location and shape of the proximal sigmoid colon. IMPRESSION: Active gastrointestinal bleeding into the proximal sigmoid colon. These results  will be called to the ordering clinician or representative by the Radiologist Assistant, and communication documented in the PACS or [employer]. Electronically Signed   By: Suzy Bouchard M.D.   On: 11/16/2021 14:49    Scheduled Meds:  (feeding supplement) PROSource Plus  30 mL Oral TID PC & HS   sodium chloride   Intravenous Once   arformoterol  15 mcg Nebulization BID   vitamin C  500 mg Oral BID   atorvastatin  10 mg Oral Daily   budesonide (PULMICORT) nebulizer solution  0.5 mg Nebulization BID   Chlorhexidine Gluconate Cloth  6 each Topical Daily   cholecalciferol  2,000 Units Oral Daily   escitalopram  5 mg Oral Daily   free water  30 mL Per Tube Q4H   mouth rinse  15 mL Mouth Rinse 4 times per day   pantoprazole (PROTONIX) IV  40 mg Intravenous Q12H   sodium chloride flush  3 mL Intravenous Q12H   torsemide  20 mg Oral Daily   traZODone  50 mg Oral QHS   Continuous Infusions:  sodium chloride Stopped (10/07/21 0542)   sodium chloride       LOS: 59 days    Time spent: 35 Mins  Critical care time spent 35 minutes in management and coordination of care.    Shawna Clamp, MD [facility]   If 7PM-7AM, please contact night-coverage

## 2021-11-18 DIAGNOSIS — J9621 Acute and chronic respiratory failure with hypoxia: Secondary | ICD-10-CM | POA: Diagnosis not present

## 2021-11-18 DIAGNOSIS — J9622 Acute and chronic respiratory failure with hypercapnia: Secondary | ICD-10-CM | POA: Diagnosis not present

## 2021-11-18 LAB — COMPREHENSIVE METABOLIC PANEL
ALT: 10 U/L (ref 0–44)
AST: 9 U/L — ABNORMAL LOW (ref 15–41)
Albumin: 2.7 g/dL — ABNORMAL LOW (ref 3.5–5.0)
Alkaline Phosphatase: 57 U/L (ref 38–126)
Anion gap: 8 (ref 5–15)
BUN: 34 mg/dL — ABNORMAL HIGH (ref 8–23)
CO2: 32 mmol/L (ref 22–32)
Calcium: 8.3 mg/dL — ABNORMAL LOW (ref 8.9–10.3)
Chloride: 99 mmol/L (ref 98–111)
Creatinine, Ser: 1.35 mg/dL — ABNORMAL HIGH (ref 0.61–1.24)
GFR, Estimated: 59 mL/min — ABNORMAL LOW (ref >60)
Glucose, Bld: 109 mg/dL — ABNORMAL HIGH (ref 70–99)
Potassium: 4.2 mmol/L (ref 3.5–5.1)
Sodium: 139 mmol/L (ref 135–145)
Total Bilirubin: 0.6 mg/dL (ref 0.3–1.2)
Total Protein: 6.3 g/dL — ABNORMAL LOW (ref 6.5–8.1)

## 2021-11-18 LAB — MAGNESIUM: Magnesium: 2.1 mg/dL (ref 1.7–2.4)

## 2021-11-18 LAB — PHOSPHORUS: Phosphorus: 3.7 mg/dL (ref 2.5–4.6)

## 2021-11-18 MED ORDER — PROSOURCE PLUS PO LIQD: 30.0000 mL | Freq: Two times a day (BID) | ORAL | Status: DC

## 2021-11-18 MED ORDER — GABAPENTIN 250 MG/5ML PO SOLN
300.0000 mg | Freq: Three times a day (TID) | ORAL | Status: DC
  Administered 2021-11-18 – 2021-12-03 (×43): 300 mg via ORAL
  Filled 2021-11-18 (×48): qty 6

## 2021-11-18 NOTE — Progress Notes (Signed)
Lab put in for a repeat draw of patients platelet count for a more accurate reading.  Patient refused to allow lab tech to draw blood.  Dr. Dwyane Dee aware.
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
## 2021-11-18 NOTE — Progress Notes (Signed)
Trach suctioned for mod amt thick tan secretions 

## 2021-11-18 NOTE — Progress Notes (Signed)
Patient arrived to unit. No distress noted. No complaints voiced. Personal item and call bell with in reach. Will continue to monitor.

## 2021-11-18 NOTE — Progress Notes (Signed)
PROGRESS NOTE    Gary [name]  IEP:[id number] DOB: [birth year] DOA: 09/19/2021 PCP: [facility] [place]   Brief Narrative: This 66 y/o male with multiple medical problems causing chronic hypercapneic respiratory failure who has had admissions in the past for exacerbations of the same and mechanical ventilation presented for evaluation of dyspnea, weakness. It is unclear if the patient has been compliant with his medications and NIMV at home.  He was admitted by the hospitalists for a presumed COPD exacerbation in the ER, treated with bronchodilators and NIMV but his mental status and hypercarbia worsened despite those interventions so he required intubation.   6/6: Presented to ED.  Required intubation and mechanical ventilation in the ED.  PCCM admitted in ICU 6/7: Hold diuresis due to worsening Creatinine.  ENT consulted for Trach placement per family request 6/8: Pt continues to spike temps tmax 101.8 degrees F.  Flexiseal placed overnight draining foul smelling watery stool~Cdiff and GI panel negative 6/9: Fever resolved.  Negative for C- diff, GI panel still pending.  Creatinine slightly worsened, requiring low dose levophed (2 mcg). Holding diuresis 6/10: propofol discontinued due hypertriglyceridemia; failed SBT 6/11: Tracheal aspirate from 6/6 resulting with Pseudomonas, ABX changed to Cefepime. neurologically intact, awaiting Trach placement 6/12: Pt calmer today following addition of oral benzo's, narcotics, and Seroquel via tube.  Creatinine worsened with decreased UOP, holding diuresis, low threshold for Nephrology consult  6/13: Pt with worsening renal failure creatinine 4.15.  UOP overnight 25 ml.  Nephrology consulted plans to start pt on hemodialysis follow dialysis catheter placement Pt underwent hemodialysis without ultrafiltration 06/14: .  Plans for HD session today.  Agitation has improved significantly with narcotics and antianxiety medications per tube   06/16:  Tracheostomy canceled 06/15 due to hyperkalemia and tentatively rescheduled for 06/20  6/18 failure to wean from vent 06/20: ENT placed Tracheostomy (#7.0 XLT cuffed).  Developed mild epistaxis post unsuccessful attempt at NGT placement.  Later developed hematochezia/melena, Protonix gtt started and GI consulted. 06/21: Diuresed yesterday with limited results. Patient had softed blood pressures throughout the day and diuresis was held as a result. HD was performed by the patient clotted off the machine and was not able to be rinsed back. Repeat hemoglobin is above 8. 06/23: Pt remains mechanically intubated settings: FiO2 50%/PEEP 10.  CXR concerning for moderate right pleural effusion with associated right basilar atelectasis vs. infiltrate.  Will perform [place] Chest to determine if pt needs right sided thoracentesis  06/24: Remains on the ventilator, FiO2 requirements decreasing, respiratory culture with abundant gram-negative rods: [name], on Meropenem 06/25: Oxygen requirements down to 40%, chest x-ray shows improvement on dense right consolidation, Pseudomonas susceptibilities pending 06/25: Pt remains on the ventilator settings: PEEP 10 and FiO2 40%; will attempt to wean PEEP to 5 to perform SBT  06/28: Pt tolerated PS on 06/27.  Overnight pt had episodes of vomiting TF's suspect secondary to malposition of nasogastric tube.  Will consult IR for PEG tube placement if unable to place PEG tube pt will need postpyloric Dobbhoff   06/29: Pt tolerated PS 5/5 for over an hour yesterday, however due to fatigue placed back on PRVC.  Pt pulled NGT out several times overnight, therefore will not attempt to reinsert.  Plans for PEG tube placement per IR on 06/30.   06/30: PEG tube placed per IR  07/1: Pt tolerated trach collar _0 % throughout the day 07/2: Pt placed on ventilator overnight settings 16/5/35% tolerating well.  Will place pt on trach  collar today and place orders for PT/OT 7/3:  Hemodynamically stable.  Tolerating TC (has remained on TC since 7/2 @ 08:00), plan for speech therapy and PT/OT 7/4: Has maintained on trach collar for greater than 48 hours, tolerating Passy-Muir valve.  Plan to transfer out of ICU. SLP recs: dysphagia 1 (puree) w/ nectar liquid.  7/5-7/6: sodium and creatinine trending appropriately, Hgb stable. Low K repleted and stable. Was off vent qhs (orders d/c or missed?) and was restarted. Will need LTAC, TOC following  7/7: pulled out trach overnight. Confusion re: vent plan among staff and myself. I asked PCCM to reevluate him and verify plan so the team and the patient can eb on the same page, and will help TOC arrange appropriate placement.  7/8: I spoke personally w/ Dr Milon Dikes [facility]) and he confirms patient does NOT need ventilator support, unless of course develops distress, but last night the patient tolerated well without it. Plan to meet w/ family today when they visit, I spoke w/ his sister yesterday on the phone. No concerns on CBC/BMP: Cr tending down a bit, Hgb stable, Na slightly high but improving over past few days. He is approaching medically stable and may be appropriate for LTAC vs SNF soon  7/9: Several long discussions with patient and family about medical condition, behavioral issues.  See separate progress notes.  Psychiatry evaluated, agree that he has capacity, started SSRI. 7/10: Patient remains medically stable, awaiting placement.  Discussed his behavioral issues again and encouraged him to always bring any concerns to nursing staff or to me calmly and I am happy to listen, requested he maintain professionalism towards staff, he seems amenable. 7/11-7/14: goal OOB to chair, needing hoyer lift, pt reeducated several times about staying in bed 07/11 and has been compliant through 07/14. Finally got bariatric chair and hoyer lift 7/12 and was OOB 07/13.  D/c monitoring and sitter and transferred to [facility] status 07/13. Awaiting placement.   7/15-17- working to reorient patient, but personality and coherence remain difficult. Stable for placement. Reminded patient not to verbally abuse staff 7/28 - rising O2 needs past couple of days, due to mucus plugging.  ABG stable, normal mentation.  Improved after suctioning. 8/2: Patient awaiting placement in a long-term acute care setting.. 8/3: Patient had multiple episodes of maroon-colored big bowel movements through out night.  H&H remains stable, vitals stable but patient at high risk for decompensation.  Patient transferred to ICU.  2 unit PRBC given, GI Dr. Lynnell Jude notified,  Nuclear scan positive for active GI bleeding in the proximal colon, vascular surgery Dr. Delana Meyer and ICU Dr. Lanney Gins notified.  Patient underwent embolization of the bleeding artery. 8/4: Patient continues to have bright red blood per rectum.  Patient underwent flex sigmoidoscopy showed blood in the rectum, rectosigmoid colon, diverticulosis.  Hemoglobin dropped to 7.6.  Transfused 2 unit PRBC.  If patient continues to bleed may require general surgery evaluation.  Assessment & Plan:   Principal Problem:   Acute on chronic respiratory failure with hypoxia and hypercapnia ([name]) Active Problems:   Major depressive disorder, recurrent episode, moderate ([name])   Bloody stool   COPD with acute exacerbation ([name])   Acute metabolic encephalopathy   HTN (hypertension)   Acute on chronic diastolic congestive heart failure ([name])   Obesity, Class III, BMI 40-49.9 (morbid obesity) ([place])   Obstructive sleep apnea   Chronic kidney disease, stage 3a ([name])   HLD (hyperlipidemia)   Pressure injury of skin   Hematochezia  Acute  GI bleed /acute blood loss anemia: Patient continued to have maroon-colored big bowel movements.   Nuclear scan positive for active GI bleeding in the proximal colon. Vascular surgery Dr. Delana Meyer has attempted embolization of the bleeding artery. Dr. Allen [name] with GI attempted flex sigmoidoscopy which  shows blood in the sigmoid colon, rectosigmoid, diverticulum.  H&H dropped.  Transfuse 2 unit PRBC.  Keep hemoglobin above 8. Monitor H&H every 6 hours, Monitor for signs and symptoms of bleeding. Continue Protonix 40 mg IV every 12 hours daily. Patient has no bowel movement overnight, continue to monitor If patient continues to bleed may require general surgery evaluation.  Left foot blood blister - appreciate podiatry evaluation; He underwent removal of blister at bedside by podiatry on 7/21 -  Use prevalon boots to aid with pressure wounds going forward.   Acute on Chronic Hypoxic Hypercapnic Respiratory Failure Acute exacerbation COPD Pseudomonal PNA OSA/OHS - S/p  tracheostomy by ENT by 6/20. - Continue Trach collar w/ ventilator support as needed / qhs but he does NOT need vent support overnight routinely per conversation w/ Dr Milon Dikes 07/08 - Completed 10 day course of ceftazidime for treatment of Pseudomonas. - At this point, he is medically stable but placement is going to be an issue for him.   -  His behavioral issues have been discussed with him and his behavior has improved  - Monitor mental status closely and check blood gas PRN - Has OHS and was reliant on nightly bipap prior to trach 7/29-8/2: stable on 10 L/min, no reports of significant de-sat episodes.   Bilateral hand and foot pain -  He has hx of gout, treated for a few days with colchicine last week.   Now having diffuse joint pains in hands and feet.   Pt and family  reports hx. of rheumatoid arthritis.   Uric acid level was normal last week ESR 48, CRP 4.2 - nonspecific Rheumatoid factor elevated 58.8 --Check anti-CCP --Continue prednisone, reduce to 20 mg  --Monitor for improvement   Acute decompensated HFpEF Hypertension - Echocardiogram 08/07/2021: LVEF 60 to 56%, grade 1 diastolic dysfunction, normal RV systolic function - Was on intermittent hemodialysis for volume removal. - has not needed HD and has  remained compensated. Cancelled permacath placement  - resumed home torsemide 7/27   AKI superimposed CKD stage IIIa > Resolved Hypokalemia > Resolved. Hypernatremia > Resolved. 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 Diff negative.   Acute Blood Loss Anemia - resolved  Nasopharyngeal trauma vs Upper GI bleed - Continue BID IV PPI - per GI, no need for scope    Hyperglycemia - CBG's achs; patient refusing at times - SSI  Acute Metabolic Encephalopathy in setting of CO2 Narcosis Sedation needs in setting of mechanical ventilation - CT Head 09/19/21: negative;  UDS negative - Treatment of hypercapnia as outlined above   Trach in place Gtube in place - No longer requiring vent support - PO intake has improved, SLP and dietary following  -Continue trach collar supplemental O2 to maintain sats >88% - Continues to eat well; G-tube will have to remain in place at least 3 months prior to removal    Morbid obesity: Estimated body mass index is 50.87 kg/m as calculated from the following:   Height as of this encounter: _0  (1.905 m).   Weight as of this encounter: 184.6 kg.  This  complicate the hospital course.  Diet and exercise discussed in detail.   DVT prophylaxis: SCDs Code Status: Full code. Family Communication: (No family at bed side) Disposition Plan:   Status is: Inpatient Remains inpatient appropriate because: Awaiting SNF placement. Barriers; remains on SNF with behavioral issues.    Consultants:  PCCU Nephrology ENT GI IR Psychiatry  Procedures:  10/03/21: tracheostomy placement 10/12/21: dialysis permacath placement  10/13/21: G-tube placed    Antimicrobials:  Anti-infectives (From admission, onward)    Start     Dose/Rate Route Frequency Ordered Stop   11/16/21 1651  ceFAZolin (ANCEF) IVPB 1 g/50 mL premix        over 30 Minutes   Continuous PRN 11/16/21 1651 11/16/21 1837   11/16/21 1559  ceFAZolin (ANCEF) IVPB 2g/100 mL premix        2 g 200 mL/hr over 30 Minutes Intravenous 30 min pre-op 11/16/21 1559 11/16/21 1700   10/12/21 1200  cefTAZidime (FORTAZ) 2 g in sodium chloride 0.9 % 100 mL IVPB        2 g 200 mL/hr over 30 Minutes Intravenous 2 times daily 10/12/21 1122 10/17/21 0844   10/12/21 0417  ceFAZolin (ANCEF) IVPB 1 g/50 mL premix  Status:  Discontinued        1 g 100 mL/hr over 30 Minutes Intravenous 30 min pre-op 10/12/21 0417 10/16/21 0751   10/09/21 2000  ceFEPIme (MAXIPIME) 1 g in sodium chloride 0.9 % 100 mL IVPB  Status:  Discontinued        1 g 200 mL/hr over 30 Minutes Intravenous Every 24 hours 10/09/21 1404 10/09/21 1515   10/09/21 2000  cefTAZidime (FORTAZ) 1 g in sodium chloride 0.9 % 100 mL IVPB  Status:  Discontinued        1 g 200 mL/hr over 30 Minutes Intravenous Every 24 hours 10/09/21 1515 10/12/21 1122   10/06/21 1830  meropenem (MERREM) 500 mg in sodium chloride 0.9 % 100 mL IVPB  Status:  Discontinued        500 mg 200 mL/hr over 30 Minutes Intravenous Every 24 hours 10/06/21 1735 10/09/21 1403   10/06/21 1800  doxycycline (VIBRAMYCIN) 100 mg in sodium chloride 0.9 % 250 mL IVPB  Status:  Discontinued        100 mg 125 mL/hr over 120 Minutes Intravenous Every 12 hours 10/06/21 1657 10/08/21 0838   09/29/21 1000  ceFEPIme (MAXIPIME) 2 g in sodium chloride 0.9 % 100 mL IVPB        2 g 200 mL/hr over 30 Minutes Intravenous Every 12 hours 09/29/21 0812 09/30/21 2342   09/26/21 2200  ceFEPIme (MAXIPIME) 1 g in sodium chloride 0.9 % 100 mL IVPB  Status:  Discontinued        1 g 200 mL/hr over 30 Minutes Intravenous Every 24 hours 09/26/21 1154 09/29/21 0812   09/26/21 2000  ceFEPIme (MAXIPIME) 2 g in sodium chloride 0.9 % 100 mL IVPB  Status:  Discontinued        2 g 200 mL/hr over 30 Minutes Intravenous Every 12 hours 09/26/21 0809 09/26/21 1154   09/24/21 1200  ceFEPIme (MAXIPIME) 2 g  in sodium chloride 0.9 % 100 mL IVPB  Status:  Discontinued        2 g 200 mL/hr over 30 Minutes Intravenous Every 8 hours 09/24/21 0950 09/26/21 0809   09/21/21 1430  azithromycin (ZITHROMAX) 500 mg in sodium chloride 0.9 % 250 mL IVPB        500 mg 250 mL/hr over  60 Minutes Intravenous Every 24 hours 09/21/21 1344 09/23/21 1543   09/21/21 1430  cefTRIAXone (ROCEPHIN) 2 g in sodium chloride 0.9 % 100 mL IVPB  Status:  Discontinued        2 g 200 mL/hr over 30 Minutes Intravenous Every 24 hours 09/21/21 1344 09/24/21 7106        Subjective: Patient was seen and examined at bedside.  Overnight events noted.   Patient reports feeling better.  He has not had bowel movement overnight. He reports he is doing okay , has not had blood in the stools yesterday.   Objective: Vitals:   11/18/21 0753 11/18/21 0800 11/18/21 0822 11/18/21 0900  BP:      Pulse: 84 87 85 86  Resp: 20 (!) _0 Temp:      TempSrc:      SpO2: 100% 100% 100% 90%  Weight:      Height:        Intake/Output Summary (Last 24 hours) at 11/18/2021 1057 Last data filed at 11/18/2021 0841 Gross per 24 hour  Intake 328.17 ml  Output 675 ml  Net -346.83 ml    Filed Weights   11/16/21 0510 11/17/21 0316 11/18/21 0428  Weight: (!) 179.2 kg (!) 180.5 kg (!) 184.6 kg    Examination:  General exam: Appears comfortable, not in any acute distress, morbidly obese, deconditioned, anxious. Respiratory system: CTA bilaterally, respiratory effort normal, RR 15. HEENT : Trach with PMV in place, trach collar Cardiovascular system: S1 & S2 heard, RRR.  Regular rate and rhythm, no murmur.   Gastrointestinal system: Abdomen is soft, non tender, non distended, PEG noted, normal bowel sounds. Central nervous system: Alert and oriented x 3. No focal neurological deficits. Extremities: No edema, no cyanosis, no clubbing. Skin: No rashes, lesions or ulcers Psychiatry: Judgement and insight appear normal. Mood & affect  appropriate.     Data Reviewed: I have personally reviewed following labs and imaging studies  CBC: Recent Labs  Lab 11/13/21 1022 11/14/21 0350 11/16/21 1132 11/16/21 1925 11/17/21 0629 11/17/21 1151 11/17/21 2102  WBC 7.3  --   --   --  11.8*  --   --   HGB 9.9*   < > 9.2* 9.3* 8.2* 7.6* 7.9*  HCT 36.7*   < > 34.1* 33.9* 29.5* 27.7* 27.6*  MCV 82.5  --   --   --  80.2  --   --   PLT 169  --   --   --  202  --   --    < > = values in this interval not displayed.    Basic Metabolic Panel: Recent Labs  Lab 11/13/21 1022 11/14/21 0350 11/15/21 1118 11/17/21 0629 11/18/21 0511  NA 142 139 140 138 139  K 4.0 4.2 4.1 4.8 4.2  CL 96* 97* 97* 100 99  CO2 37* 34* 36* 32 32  GLUCOSE 101* 122* 124* 113* 109*  BUN 39* 37* 30* 35* 34*  CREATININE 1.29* 1.33* 1.19 1.42* 1.35*  CALCIUM 8.5* 8.4* 8.4* 8.2* 8.3*  MG  --   --   --   --  2.1  PHOS  --   --   --   --  3.7    GFR: Estimated Creatinine Clearance: 97.3 mL/min (A) (by C-G formula based on SCr of 1.35 mg/dL (H)). Liver Function Tests: Recent Labs  Lab 11/18/21 0511  AST 9*  ALT 10  ALKPHOS 57  BILITOT 0.6  PROT 6.3*  ALBUMIN 2.7*   No results for input(s): "LIPASE", "AMYLASE" in the last 168 hours. No results for input(s): "AMMONIA" in the last 168 hours. Coagulation Profile: Recent Labs  Lab 11/16/21 1132  INR 1.0    Cardiac Enzymes: No results for input(s): "CKTOTAL", "CKMB", "CKMBINDEX", "TROPONINI" in the last 168 hours. BNP (last 3 results) No results for input(s): "PROBNP" in the last 8760 hours. HbA1C: No results for input(s): "HGBA1C" in the last 72 hours. CBG: No results for input(s): "GLUCAP" in the last 168 hours.  Lipid Profile: No results for input(s): "CHOL", "HDL", "LDLCALC", "Gary", "CHOLHDL", "LDLDIRECT" in the last 72 hours. Thyroid Function Tests: No results for input(s): "TSH", "T4TOTAL", "FREET4", "T3FREE", "THYROIDAB" in the last 72 hours. Anemia Panel: No results for  input(s): "VITAMINB12", "FOLATE", "FERRITIN", "TIBC", "IRON", "RETICCTPCT" in the last 72 hours. Sepsis Labs: No results for input(s): "PROCALCITON", "LATICACIDVEN" in the last 168 hours.  No results found for this or any previous visit (from the past 240 hour(s)).   Radiology Studies: PERIPHERAL VASCULAR CATHETERIZATION  Result Date: 11/16/2021 See surgical note for result.  CT Angio Abd/Pel w/ and/or w/o  Result Date: 11/16/2021 CLINICAL DATA:  Bloody stools.  Evaluate for GI bleed. EXAM: CTA ABDOMEN AND PELVIS WITHOUT AND WITH CONTRAST TECHNIQUE: Multidetector CT imaging of the abdomen and pelvis was performed using the standard protocol during bolus administration of intravenous contrast. Multiplanar reconstructed images and MIPs were obtained and reviewed to evaluate the vascular anatomy. RADIATION DOSE REDUCTION: This exam was performed according to the departmental dose-optimization program which includes automated exposure control, adjustment of the mA and/or kV according to patient size and/or use of iterative reconstruction technique. CONTRAST:  173m OMNIPAQUE IOHEXOL 350 MG/ML SOLN COMPARISON:  Nuclear medicine bleeding exam 11/16/2021. 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 The abdominal aorta measures up to 3.3 cm at this level and the saccular component roughly measures 2.2 x 2.3 x 2.6 cm. This saccular aneurysm appears to have enlarged since 2015 but was poorly characterized on the previous examination. Saccular aneurysm is located above the inferior mesenteric artery. There is mild irregularity in the abdominal aorta distal to the IMA. Celiac: Common origin for the celiac trunk and SMA. Celiac trunk origin is widely patent. Main branch vessels are patent. SMA: Patent without evidence of aneurysm, dissection, vasculitis or significant stenosis. Renals: Both renal  arteries are patent without evidence of aneurysm, dissection, vasculitis, fibromuscular dysplasia or significant stenosis. IMA: Patent without evidence of aneurysm, dissection, vasculitis or significant stenosis. Inflow: Patent without evidence of aneurysm, dissection, vasculitis or significant stenosis. Proximal Outflow: Proximal femoral arteries are patent bilaterally. Veins: No gross abnormality to the IVC or iliac veins. Main portal venous system appears to be patent with significant streak artifact in the abdomen. Review of the MIP images confirms the above findings. NON-VASCULAR Lower chest: Few densities at the posterior left lung base are suggestive for atelectasis or scarring. No large pleural effusions. Hepatobiliary: Streak artifact in the abdomen limits evaluation. No gross abnormality to the liver or gallbladder. Pancreas: Unremarkable. No pancreatic ductal dilatation or surrounding inflammatory changes. Spleen: Normal in size. Limited evaluation due to streak artifact in the abdomen. Adrenals/Urinary Tract: 1.8 cm low-density nodule in the right adrenal gland is suggestive for a benign adenoma based on the Hounsfield units on the precontrast images. Normal appearance of left adrenal gland. Negative for kidney stones or hydronephrosis. Probable cyst in left  kidney upper pole but poorly characterized due to streak artifact. Fluid in the urinary bladder without gross abnormality. Stomach/Bowel: Limited evaluation for GI bleeding on this examination due to extensive streak artifact throughout the abdomen and pelvis. An area of active GI bleeding is not confidently identified. Patient has probably had a right hemicolectomy. Moderate amount of stool in the colon. There is redundancy and diverticula involving the sigmoid colon. Difficult to exclude inflammation in the sigmoid colon. There is a balloon retention gastrostomy tube in the distal stomach. No significant small bowel distension. Lymphatic: Prominent  lymph node adjacent to the sigmoid colon on sequence 6 image 68 measures 1.1 cm. Slightly prominent lymph node anterior to the distal aorta on sequence 6, image 56 measures 1.1 cm in the short axis. No other significant lymph node enlargement in the abdomen or pelvis. Reproductive: Limited evaluation of the prostate. Other: There is no evidence for free fluid. Negative for free air. Periumbilical ventral hernia containing fat. Multiple surgical clips in the right abdomen compatible with previous bowel surgery Musculoskeletal: No acute bone abnormality. IMPRESSION: VASCULAR 1. 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 Abdominal aorta at this level measures up to 3.3 cm. Saccular component measures up to 2.6 cm. Recommend vascular consultation. Reference: J Vasc Surg 4944;96:7-59. 2. Main visceral arteries are patent. Common trunk for the celiac trunk and SMA which is a normal variant. NON-VASCULAR 1. Limited evaluation for GI bleeding due to extensive streak artifact in the abdomen and pelvis. No clear evidence for active GI bleeding on this examination. 2. Redundancy in the sigmoid colon with diverticula. Difficult to exclude inflammatory changes and wall thickening involving the sigmoid colon. Patient would likely benefit from follow-up imaging or colonoscopy in this area. 3. Slightly enlarged lymph nodes in the abdomen are indeterminate. Lymph nodes may be associated with the sigmoid colon and could be reactive. 4. Gastrostomy tube in place. Electronically Signed   By: Markus Daft M.D.   On: 11/16/2021 17:43   NM GI Blood Loss  Result Date: 11/16/2021 CLINICAL DATA:  Bright red blood per rectum. EXAM: NUCLEAR MEDICINE GASTROINTESTINAL BLEEDING SCAN TECHNIQUE: Sequential abdominal images were obtained following intravenous administration of Tc-79mlabeled red blood cells. RADIOPHARMACEUTICALS:  20.7 mCi Tc-94mertechnetate in-vitro labeled red cells. COMPARISON:  CT 12/13/2021 FINDINGS: By 5  minutes of imaging, abnormal tagged red blood cells accumulation is noted in the LEFT lower quadrant. Tagged red blood cell accumulates over the next 50 minutes and remain centrally centered in the LEFT lower quadrant. The pattern of activity conforms to the expected location and shape of the proximal sigmoid colon. IMPRESSION: Active gastrointestinal bleeding into the proximal sigmoid colon. These results will be called to the ordering clinician or representative by the Radiologist Assistant, and communication documented in the PACS or ClFrontier Oil CorporationElectronically Signed   By: StSuzy Bouchard.D.   On: 11/16/2021 14:49    Scheduled Meds:  (feeding supplement) PROSource Plus  30 mL Oral TID PC & HS   arformoterol  15 mcg Nebulization BID   vitamin C  500 mg Oral BID   atorvastatin  10 mg Oral Daily   Chlorhexidine Gluconate Cloth  6 each Topical Daily   cholecalciferol  2,000 Units Oral Daily   escitalopram  5 mg Oral Daily   free water  30 mL Per Tube Q4H   gabapentin  300 mg Oral Q8H   mouth rinse  15 mL Mouth Rinse 4 times per day   pantoprazole (PROTONIX) IV  40 mg Intravenous Q12H   sodium chloride flush  3 mL Intravenous Q12H   Continuous Infusions:  sodium chloride Stopped (10/07/21 0542)   sodium chloride       LOS: 60 days    Time spent: 35 Mins  Critical care time spent 35 minutes in management and coordination of care.    Shawna Clamp, MD [facility]   If 7PM-7AM, please contact night-coverage

## 2021-11-18 NOTE — Progress Notes (Signed)
OT Cancellation Note  Patient Details Name: Gary [name] MRN: [id number] DOB: April 24, 1955   Cancelled Treatment:    Reason Eval/Treat Not Completed: Patient declined, no reason specifiedpt refused re-eval. Stated "I cant get up i just had a procedure". Educated pt on importance of mobility to continue to prevent further deconditioning. Pt continues to refuse. When this therapist educated pt she would return on the next appropriate date he stated "you can try but I don't know if I will get up"   Shanon Payor, OTD OTR/L  11/18/21, 2:46 PM

## 2021-11-18 NOTE — Plan of Care (Signed)
  Problem: Education: Goal: Ability to demonstrate management of disease process will improve Outcome: Progressing Goal: Ability to verbalize understanding of medication therapies will improve Outcome: Progressing   Problem: Activity: Goal: Capacity to carry out activities will improve Outcome: Progressing   Problem: Cardiac: Goal: Ability to achieve and maintain adequate cardiopulmonary perfusion will improve Outcome: Progressing   

## 2021-11-18 NOTE — Progress Notes (Signed)
At 1045 report given to receiving nurse on 2C, patient transferred in stable condition in hospital bed.

## 2021-11-18 NOTE — Plan of Care (Signed)
Continuing with plan of care. 

## 2021-11-18 NOTE — Progress Notes (Signed)
Patient demanded that his bedside table be cleaned off immediately or else he would throw all his trash on the floor.  Spoke with patient regarding throwing trash and offered to clean off his bedside table, patient continued with inappropriate verbal communication.  Table was cleaned off for patient and ensured that patient had his call bell.  Patient then hollered nurse loudly multiple times immediately after staff members would leave the room.  Patient would be made comfortable with all needs met and when staff would leave kicked his foot out from under his sheets and would holler for staff multiple times even after being notified by secretary that a staff member was on the way, patient would then demand staff member to cover his foot up, this behavior was repeated multiple times.

## 2021-11-19 DIAGNOSIS — J9622 Acute and chronic respiratory failure with hypercapnia: Secondary | ICD-10-CM | POA: Diagnosis not present

## 2021-11-19 DIAGNOSIS — J9621 Acute and chronic respiratory failure with hypoxia: Secondary | ICD-10-CM | POA: Diagnosis not present

## 2021-11-19 LAB — TYPE AND SCREEN
ABO/RH(D): A NEG
Antibody Screen: NEGATIVE
Unit division: 0
Unit division: 0
Unit division: 0
Unit division: 0

## 2021-11-19 LAB — BASIC METABOLIC PANEL
Anion gap: 8 (ref 5–15)
BUN: 25 mg/dL — ABNORMAL HIGH (ref 8–23)
CO2: 28 mmol/L (ref 22–32)
Calcium: 8 mg/dL — ABNORMAL LOW (ref 8.9–10.3)
Chloride: 100 mmol/L (ref 98–111)
Creatinine, Ser: 1.26 mg/dL — ABNORMAL HIGH (ref 0.61–1.24)
GFR, Estimated: >60 mL/min (ref >60)
Glucose, Bld: 123 mg/dL — ABNORMAL HIGH (ref 70–99)
Potassium: 4.2 mmol/L (ref 3.5–5.1)
Sodium: 136 mmol/L (ref 135–145)

## 2021-11-19 LAB — CBC WITH DIFFERENTIAL/PLATELET
Abs Immature Granulocytes: 0.16 10*3/uL — ABNORMAL HIGH (ref 0.00–0.07)
Basophils Absolute: 0 10*3/uL (ref 0.0–0.1)
Basophils Relative: 0 %
Eosinophils Absolute: 0.2 10*3/uL (ref 0.0–0.5)
Eosinophils Relative: 2 %
HCT: 26.3 % — ABNORMAL LOW (ref 39.0–52.0)
Hemoglobin: 7.3 g/dL — ABNORMAL LOW (ref 13.0–17.0)
Immature Granulocytes: 2 %
Lymphocytes Relative: 17 %
Lymphs Abs: 1.8 10*3/uL (ref 0.7–4.0)
MCH: 22.5 pg — ABNORMAL LOW (ref 26.0–34.0)
MCHC: 27.8 g/dL — ABNORMAL LOW (ref 30.0–36.0)
MCV: 80.9 fL (ref 80.0–100.0)
Monocytes Absolute: 0.8 10*3/uL (ref 0.1–1.0)
Monocytes Relative: 8 %
Neutro Abs: 7.4 10*3/uL (ref 1.7–7.7)
Neutrophils Relative %: 71 %
Platelets: UNDETERMINED 10*3/uL (ref 150–400)
RBC: 3.25 MIL/uL — ABNORMAL LOW (ref 4.22–5.81)
RDW: 17.1 % — ABNORMAL HIGH (ref 11.5–15.5)
Smear Review: UNDETERMINED
WBC: 10.4 10*3/uL (ref 4.0–10.5)
nRBC: 0.6 % — ABNORMAL HIGH (ref 0.0–0.2)

## 2021-11-19 LAB — BPAM RBC
Blood Product Expiration Date: 202308082359
Blood Product Expiration Date: 202308082359
Blood Product Expiration Date: 202309042359
Blood Product Expiration Date: 202309042359
ISSUE DATE / TIME: 202308041707
Unit Type and Rh: 5100
Unit Type and Rh: 5100
Unit Type and Rh: 9500
Unit Type and Rh: 9500

## 2021-11-19 LAB — PHOSPHORUS: Phosphorus: 3.6 mg/dL (ref 2.5–4.6)

## 2021-11-19 LAB — MAGNESIUM: Magnesium: 2.1 mg/dL (ref 1.7–2.4)

## 2021-11-19 LAB — PREPARE RBC (CROSSMATCH)

## 2021-11-19 MED ORDER — SODIUM CHLORIDE 0.9% IV SOLUTION
Freq: Once | INTRAVENOUS | Status: AC
Start: 2021-11-19 — End: 2021-11-19

## 2021-11-19 NOTE — Progress Notes (Signed)
1 URBC was started with a second Therapist, sports.VSS. Pt denies pain or SOB.

## 2021-11-19 NOTE — Progress Notes (Addendum)
PROGRESS NOTE    Gary [name]  IEP:[id number] DOB: [birth year] DOA: 09/19/2021 PCP: [facility] [place]   Brief Narrative: This 66 y/o male with multiple medical problems causing chronic hypercapneic respiratory failure who has had admissions in the past for exacerbations of the same and mechanical ventilation presented for evaluation of dyspnea, weakness. It is unclear if the patient has been compliant with his medications and NIMV at home.  He was admitted by the hospitalists for a presumed COPD exacerbation in the ER, treated with bronchodilators and NIMV but his mental status and hypercarbia worsened despite those interventions so he required intubation.   6/6: Presented to ED.  Required intubation and mechanical ventilation in the ED.  PCCM admitted in ICU 6/7: Hold diuresis due to worsening Creatinine.  ENT consulted for Trach placement per family request 6/8: Pt continues to spike temps tmax 101.8 degrees F.  Flexiseal placed overnight draining foul smelling watery stool~Cdiff and GI panel negative 6/9: Fever resolved.  Negative for C- diff, GI panel still pending.  Creatinine slightly worsened, requiring low dose levophed (2 mcg). Holding diuresis 6/10: propofol discontinued due hypertriglyceridemia; failed SBT 6/11: Tracheal aspirate from 6/6 resulting with Pseudomonas, ABX changed to Cefepime. neurologically intact, awaiting Trach placement 6/12: Pt calmer today following addition of oral benzo's, narcotics, and Seroquel via tube.  Creatinine worsened with decreased UOP, holding diuresis, low threshold for Nephrology consult  6/13: Pt with worsening renal failure creatinine 4.15.  UOP overnight 25 ml.  Nephrology consulted plans to start pt on hemodialysis follow dialysis catheter placement Pt underwent hemodialysis without ultrafiltration 06/14: .  Plans for HD session today.  Agitation has improved significantly with narcotics and antianxiety medications per tube   06/16:  Tracheostomy canceled 06/15 due to hyperkalemia and tentatively rescheduled for 06/20  6/18 failure to wean from vent 06/20: ENT placed Tracheostomy (#7.0 XLT cuffed).  Developed mild epistaxis post unsuccessful attempt at NGT placement.  Later developed hematochezia/melena, Protonix gtt started and GI consulted. 06/21: Diuresed yesterday with limited results. Patient had softed blood pressures throughout the day and diuresis was held as a result. HD was performed by the patient clotted off the machine and was not able to be rinsed back. Repeat hemoglobin is above 8. 06/23: Pt remains mechanically intubated settings: FiO2 50%/PEEP 10.  CXR concerning for moderate right pleural effusion with associated right basilar atelectasis vs. infiltrate.  Will perform [place] Chest to determine if pt needs right sided thoracentesis  06/24: Remains on the ventilator, FiO2 requirements decreasing, respiratory culture with abundant gram-negative rods: [name], on Meropenem 06/25: Oxygen requirements down to 40%, chest x-ray shows improvement on dense right consolidation, Pseudomonas susceptibilities pending 06/25: Pt remains on the ventilator settings: PEEP 10 and FiO2 40%; will attempt to wean PEEP to 5 to perform SBT  06/28: Pt tolerated PS on 06/27.  Overnight pt had episodes of vomiting TF's suspect secondary to malposition of nasogastric tube.  Will consult IR for PEG tube placement if unable to place PEG tube pt will need postpyloric Dobbhoff   06/29: Pt tolerated PS 5/5 for over an hour yesterday, however due to fatigue placed back on PRVC.  Pt pulled NGT out several times overnight, therefore will not attempt to reinsert.  Plans for PEG tube placement per IR on 06/30.   06/30: PEG tube placed per IR  07/1: Pt tolerated trach collar _0 % throughout the day 07/2: Pt placed on ventilator overnight settings 16/5/35% tolerating well.  Will place pt on trach  collar today and place orders for PT/OT 7/3:  Hemodynamically stable.  Tolerating TC (has remained on TC since 7/2 @ 08:00), plan for speech therapy and PT/OT 7/4: Has maintained on trach collar for greater than 48 hours, tolerating Passy-Muir valve.  Plan to transfer out of ICU. SLP recs: dysphagia 1 (puree) w/ nectar liquid.  7/5-7/6: sodium and creatinine trending appropriately, Hgb stable. Low K repleted and stable. Was off vent qhs (orders d/c or missed?) and was restarted. Will need LTAC, TOC following  7/7: pulled out trach overnight. Confusion re: vent plan among staff and myself. I asked PCCM to reevluate him and verify plan so the team and the patient can eb on the same page, and will help TOC arrange appropriate placement.  7/8: I spoke personally w/ Dr Milon Dikes [facility]) and he confirms patient does NOT need ventilator support, unless of course develops distress, but last night the patient tolerated well without it. Plan to meet w/ family today when they visit, I spoke w/ his sister yesterday on the phone. No concerns on CBC/BMP: Cr tending down a bit, Hgb stable, Na slightly high but improving over past few days. He is approaching medically stable and may be appropriate for LTAC vs SNF soon  7/9: Several long discussions with patient and family about medical condition, behavioral issues.  See separate progress notes.  Psychiatry evaluated, agree that he has capacity, started SSRI. 7/10: Patient remains medically stable, awaiting placement.  Discussed his behavioral issues again and encouraged him to always bring any concerns to nursing staff or to me calmly and I am happy to listen, requested he maintain professionalism towards staff, he seems amenable. 7/11-7/14: goal OOB to chair, needing hoyer lift, pt reeducated several times about staying in bed 07/11 and has been compliant through 07/14. Finally got bariatric chair and hoyer lift 7/12 and was OOB 07/13.  D/c monitoring and sitter and transferred to [place] status 07/13. Awaiting placement.   7/15-17- working to reorient patient, but personality and coherence remain difficult. Stable for placement. Reminded patient not to verbally abuse staff 7/28 - rising O2 needs past couple of days, due to mucus plugging.  ABG stable, normal mentation.  Improved after suctioning. 8/2: Patient awaiting placement in a long-term acute care setting.. 8/3: Patient had multiple episodes of maroon-colored big bowel movements through out night.  H&H remains stable, vitals stable but patient at high risk for decompensation.  Patient transferred to ICU.  2 unit PRBC given, GI Dr. Lynnell Jude notified,  Nuclear scan positive for active GI bleeding in the proximal colon, vascular surgery Dr. Delana Meyer and ICU Dr. Lanney Gins notified.  Patient underwent embolization of the bleeding artery. 8/4: Patient continues to have bright red blood per rectum.  Patient underwent flex sigmoidoscopy showed blood in the rectum, rectosigmoid colon, diverticulosis.  Hemoglobin dropped to 7.6.  Transfused 2 unit PRBC.  If patient continues to bleed may require general surgery evaluation. 8/5-8/6: Patient remains hemodynamically stable, Patient reports very little blood in the stools.  Hemoglobin dropped to 7.3 requiring 1 unit of PRBC.  Assessment & Plan:   Principal Problem:   Acute on chronic respiratory failure with hypoxia and hypercapnia ([name]) Active Problems:   Major depressive disorder, recurrent episode, moderate ([name])   Bloody stool   COPD with acute exacerbation ([name])   Acute metabolic encephalopathy   HTN (hypertension)   Acute on chronic diastolic congestive heart failure ([name])   Obesity, Class III, BMI 40-49.9 (morbid obesity) ([place])   Obstructive sleep apnea  Chronic kidney disease, stage 3a ([place])   HLD (hyperlipidemia)   Pressure injury of skin   Hematochezia  Acute GI bleed /acute blood loss anemia: Patient continued to have maroon-colored big bowel movements.   Nuclear scan positive for active GI bleeding in the  proximal colon. Vascular surgery Dr. Delana Meyer has attempted embolization of the bleeding artery. Dr. Allen [name] with GI attempted flex sigmoidoscopy which shows blood in the sigmoid colon, rectosigmoid, diverticulum.  H&H dropped.  Transfused 2 unit PRBC.  Keep hemoglobin above 8. Monitor H&H every 6 hours, Monitor for signs and symptoms of bleeding. Continue Protonix 40 mg IV every 12 hours daily. Patient has no bowel movement overnight, continue to monitor If patient continues to bleed may require general surgery evaluation. Patient remains hemodynamically stable, Patient reports very little blood in the stools.   Hemoglobin dropped to 7.3 requiring 1 unit of PRBC.   Left foot blood blister - appreciate podiatry evaluation; He underwent removal of blister at bedside by podiatry on 7/21 -  Use prevalon boots to aid with pressure wounds going forward.   Acute on Chronic Hypoxic Hypercapnic Respiratory Failure Acute exacerbation COPD Pseudomonal PNA OSA/OHS - S/p  tracheostomy by ENT by 6/20. - Continue Trach collar w/ ventilator support as needed / qhs but he does NOT need vent support overnight routinely per conversation w/ Dr Milon Dikes 07/08 - Completed 10 day course of ceftazidime for treatment of Pseudomonas. - At this point, he is medically stable but placement is going to be an issue for him.   -  His behavioral issues have been discussed with him and his behavior has improved  - Monitor mental status closely and check blood gas PRN - Has OHS and was reliant on nightly bipap prior to trach 7/29-8/2: stable on 10 L/min, no reports of significant de-sat episodes.   Bilateral hand and foot pain -  He has hx of gout, treated for a few days with colchicine last week.   Now having diffuse joint pains in hands and feet.   Pt and family  reports hx. of rheumatoid arthritis.   Uric acid level was normal last week ESR 48, CRP 4.2 - nonspecific Rheumatoid factor elevated 58.8 --Check  anti-CCP --Continue prednisone, reduce to 20 mg  --Monitor for improvement   Acute decompensated HFpEF Hypertension - Echocardiogram 08/07/2021: LVEF 60 to 53%, grade 1 diastolic dysfunction, normal RV systolic function - Was on intermittent hemodialysis for volume removal. - has not needed HD and has remained compensated. Cancelled permacath placement  - resumed home torsemide 7/27   AKI superimposed CKD stage IIIa > Resolved Hypokalemia > Resolved. Hypernatremia > Resolved. 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 Diff negative.   Acute Blood Loss Anemia - resolved  Nasopharyngeal trauma vs Upper GI bleed - Continue BID IV PPI - per GI, no need for scope    Hyperglycemia - CBG's achs; patient refusing at times - SSI  Acute Metabolic Encephalopathy in setting of CO2 Narcosis Sedation needs in setting of mechanical ventilation - CT Head 09/19/21: negative;  UDS negative - Treatment of hypercapnia as outlined above   Trach in place Gtube in place - No longer requiring vent support - PO intake has improved, SLP and dietary following  -Continue trach collar supplemental O2 to maintain sats >88% - Continues to eat well; G-tube will have to remain in place at least 3 months prior to  removal    Morbid obesity: Estimated body mass index is 50.87 kg/m as calculated from the following:   Height as of this encounter: _0  (1.905 m).   Weight as of this encounter: 184.6 kg.  This  complicate the hospital course. Diet and exercise discussed in detail.   DVT prophylaxis: SCDs Code Status: Full code. Family Communication: (No family at bed side) Disposition Plan:   Status is: Inpatient Remains inpatient appropriate because: Awaiting SNF placement. Barriers; remains on SNF with behavioral issues.    Consultants:  PCCU Nephrology ENT GI IR Psychiatry  Procedures:   10/03/21: tracheostomy placement 10/12/21: dialysis permacath placement  10/13/21: G-tube placed    Antimicrobials:  Anti-infectives (From admission, onward)    Start     Dose/Rate Route Frequency Ordered Stop   11/16/21 1651  ceFAZolin (ANCEF) IVPB 1 g/50 mL premix        over 30 Minutes  Continuous PRN 11/16/21 1651 11/16/21 1837   11/16/21 1559  ceFAZolin (ANCEF) IVPB 2g/100 mL premix        2 g 200 mL/hr over 30 Minutes Intravenous 30 min pre-op 11/16/21 1559 11/16/21 1700   10/12/21 1200  cefTAZidime (FORTAZ) 2 g in sodium chloride 0.9 % 100 mL IVPB        2 g 200 mL/hr over 30 Minutes Intravenous 2 times daily 10/12/21 1122 10/17/21 0844   10/12/21 0417  ceFAZolin (ANCEF) IVPB 1 g/50 mL premix  Status:  Discontinued        1 g 100 mL/hr over 30 Minutes Intravenous 30 min pre-op 10/12/21 0417 10/16/21 0751   10/09/21 2000  ceFEPIme (MAXIPIME) 1 g in sodium chloride 0.9 % 100 mL IVPB  Status:  Discontinued        1 g 200 mL/hr over 30 Minutes Intravenous Every 24 hours 10/09/21 1404 10/09/21 1515   10/09/21 2000  cefTAZidime (FORTAZ) 1 g in sodium chloride 0.9 % 100 mL IVPB  Status:  Discontinued        1 g 200 mL/hr over 30 Minutes Intravenous Every 24 hours 10/09/21 1515 10/12/21 1122   10/06/21 1830  meropenem (MERREM) 500 mg in sodium chloride 0.9 % 100 mL IVPB  Status:  Discontinued        500 mg 200 mL/hr over 30 Minutes Intravenous Every 24 hours 10/06/21 1735 10/09/21 1403   10/06/21 1800  doxycycline (VIBRAMYCIN) 100 mg in sodium chloride 0.9 % 250 mL IVPB  Status:  Discontinued        100 mg 125 mL/hr over 120 Minutes Intravenous Every 12 hours 10/06/21 1657 10/08/21 0838   09/29/21 1000  ceFEPIme (MAXIPIME) 2 g in sodium chloride 0.9 % 100 mL IVPB        2 g 200 mL/hr over 30 Minutes Intravenous Every 12 hours 09/29/21 0812 09/30/21 2342   09/26/21 2200  ceFEPIme (MAXIPIME) 1 g in sodium chloride 0.9 % 100 mL IVPB  Status:  Discontinued        1 g 200 mL/hr over  30 Minutes Intravenous Every 24 hours 09/26/21 1154 09/29/21 0812   09/26/21 2000  ceFEPIme (MAXIPIME) 2 g in sodium chloride 0.9 % 100 mL IVPB  Status:  Discontinued        2 g 200 mL/hr over 30 Minutes Intravenous Every 12 hours 09/26/21 0809 09/26/21 1154   09/24/21 1200  ceFEPIme (MAXIPIME) 2 g in sodium chloride 0.9 % 100 mL IVPB  Status:  Discontinued  2 g 200 mL/hr over 30 Minutes Intravenous Every 8 hours 09/24/21 0950 09/26/21 0809   09/21/21 1430  azithromycin (ZITHROMAX) 500 mg in sodium chloride 0.9 % 250 mL IVPB        500 mg 250 mL/hr over 60 Minutes Intravenous Every 24 hours 09/21/21 1344 09/23/21 1543   09/21/21 1430  cefTRIAXone (ROCEPHIN) 2 g in sodium chloride 0.9 % 100 mL IVPB  Status:  Discontinued        2 g 200 mL/hr over 30 Minutes Intravenous Every 24 hours 09/21/21 1344 09/24/21 0762        Subjective: Patient was seen and examined at bedside.  Overnight events noted.   Patient reports feeling better, he reports very little blood in the stools. His hemoglobin has dropped to 7.3 now requiring 1 unit of PRBC.  Objective: Vitals:   11/18/21 2234 11/19/21 0521 11/19/21 0728 11/19/21 0850  BP: 108/68 100/64  107/73  Pulse: 86 86  80  Resp: 18 17  (!) 22  Temp: 98 F (36.7 C) 98.3 F (36.8 C)  98.6 F (37 C)  TempSrc:    Oral  SpO2: 92% (!) 89% 92% 92%  Weight:      Height:       No intake or output data in the 24 hours ending 11/19/21 1141  Filed Weights   11/16/21 0510 11/17/21 0316 11/18/21 0428  Weight: (!) 179.2 kg (!) 180.5 kg (!) 184.6 kg    Examination:  General exam: Appears comfortable, not in any acute distress.  Morbidly obese, deconditioned. Respiratory system: CTA bilaterally, respiratory effort normal, RR 18 HEENT : Trach with PMV in place, trach collar Cardiovascular system: S1-S2 heard, regular rate and rhythm, no murmur Gastrointestinal system: Abdomen is soft, non tender, non distended, PEG noted, normal bowel  sounds. Central nervous system: Alert and oriented x 3. No focal neurological deficits. Extremities: No edema, no cyanosis, no clubbing. Skin: No rashes, lesions or ulcers Psychiatry: Judgement and insight appear normal. Mood & affect appropriate.     Data Reviewed: I have personally reviewed following labs and imaging studies  CBC: Recent Labs  Lab 11/13/21 1022 11/14/21 0350 11/16/21 1925 11/17/21 0629 11/17/21 1151 11/17/21 2102 11/19/21 0646  WBC 7.3  --   --  11.8*  --   --  10.4  NEUTROABS  --   --   --   --   --   --  7.4  HGB 9.9*   < > 9.3* 8.2* 7.6* 7.9* 7.3*  HCT 36.7*   < > 33.9* 29.5* 27.7* 27.6* 26.3*  MCV 82.5  --   --  80.2  --   --  80.9  PLT 169  --   --  202  --   --  PLATELET CLUMPS NOTED ON SMEAR, UNABLE TO ESTIMATE   < > = values in this interval not displayed.   Basic Metabolic Panel: Recent Labs  Lab 11/14/21 0350 11/15/21 1118 11/17/21 0629 11/18/21 0511 11/19/21 0646  NA 139 140 138 139 136  K 4.2 4.1 4.8 4.2 4.2  CL 97* 97* 100 99 100  CO2 34* 36* 32 32 28  GLUCOSE 122* 124* 113* 109* 123*  BUN 37* 30* 35* 34* 25*  CREATININE 1.33* 1.19 1.42* 1.35* 1.26*  CALCIUM 8.4* 8.4* 8.2* 8.3* 8.0*  MG  --   --   --  2.1 2.1  PHOS  --   --   --  3.7 3.6   GFR: Estimated  Creatinine Clearance: 104.3 mL/min (A) (by C-G formula based on SCr of 1.26 mg/dL (H)). Liver Function Tests: Recent Labs  Lab 11/18/21 0511  AST 9*  ALT 10  ALKPHOS 57  BILITOT 0.6  PROT 6.3*  ALBUMIN 2.7*   No results for input(s): "LIPASE", "AMYLASE" in the last 168 hours. No results for input(s): "AMMONIA" in the last 168 hours. Coagulation Profile: Recent Labs  Lab 11/16/21 1132  INR 1.0   Cardiac Enzymes: No results for input(s): "CKTOTAL", "CKMB", "CKMBINDEX", "TROPONINI" in the last 168 hours. BNP (last 3 results) No results for input(s): "PROBNP" in the last 8760 hours. HbA1C: No results for input(s): "HGBA1C" in the last 72 hours. CBG: No results for  input(s): "GLUCAP" in the last 168 hours.  Lipid Profile: No results for input(s): "CHOL", "HDL", "LDLCALC", "Gary", "CHOLHDL", "LDLDIRECT" in the last 72 hours. Thyroid Function Tests: No results for input(s): "TSH", "T4TOTAL", "FREET4", "T3FREE", "THYROIDAB" in the last 72 hours. Anemia Panel: No results for input(s): "VITAMINB12", "FOLATE", "FERRITIN", "TIBC", "IRON", "RETICCTPCT" in the last 72 hours. Sepsis Labs: No results for input(s): "PROCALCITON", "LATICACIDVEN" in the last 168 hours.  No results found for this or any previous visit (from the past 240 hour(s)).   Radiology Studies: No results found.  Scheduled Meds:  (feeding supplement) PROSource Plus  30 mL Oral TID PC & HS   sodium chloride   Intravenous Once   arformoterol  15 mcg Nebulization BID   vitamin C  500 mg Oral BID   atorvastatin  10 mg Oral Daily   Chlorhexidine Gluconate Cloth  6 each Topical Daily   cholecalciferol  2,000 Units Oral Daily   escitalopram  5 mg Oral Daily   free water  30 mL Per Tube Q4H   gabapentin  300 mg Oral Q8H   mouth rinse  15 mL Mouth Rinse 4 times per day   pantoprazole (PROTONIX) IV  40 mg Intravenous Q12H   sodium chloride flush  3 mL Intravenous Q12H   Continuous Infusions:  sodium chloride Stopped (10/07/21 0542)   sodium chloride       LOS: 61 days    Time spent: 35 Mins  Time spent 35 minutes in management and coordination of care.    Shawna Clamp, MD [facility]   If 7PM-7AM, please contact night-coverage

## 2021-11-19 NOTE — Progress Notes (Signed)
End of shift note:  Pt received 1 UPRBC. VSS. Pt has a trach collar with 8L [place]. Pt had a small BW and RN noticed small strikes of blood & MD was notified.

## 2021-11-20 ENCOUNTER — Encounter: Payer: Self-pay | Admitting: Gastroenterology

## 2021-11-20 DIAGNOSIS — J9621 Acute and chronic respiratory failure with hypoxia: Secondary | ICD-10-CM | POA: Diagnosis not present

## 2021-11-20 DIAGNOSIS — J9622 Acute and chronic respiratory failure with hypercapnia: Secondary | ICD-10-CM | POA: Diagnosis not present

## 2021-11-20 LAB — TYPE AND SCREEN
ABO/RH(D): A NEG
Antibody Screen: NEGATIVE
Unit division: 0

## 2021-11-20 LAB — BPAM RBC
Blood Product Expiration Date: 202308082359
ISSUE DATE / TIME: 202308061228
Unit Type and Rh: 9500

## 2021-11-20 LAB — HEMOGLOBIN AND HEMATOCRIT, BLOOD
HCT: 27.7 % — ABNORMAL LOW (ref 39.0–52.0)
Hemoglobin: 7.9 g/dL — ABNORMAL LOW (ref 13.0–17.0)

## 2021-11-20 MED ORDER — LIDOCAINE 5 % EX PTCH
1.0000 | MEDICATED_PATCH | Freq: Every day | CUTANEOUS | Status: DC
  Administered 2021-11-20 – 2021-12-12 (×16): 1 via TRANSDERMAL
  Filled 2021-11-20 (×22): qty 1

## 2021-11-20 NOTE — Progress Notes (Signed)
OT Cancellation Note  Patient Details Name: Gary [name] MRN: [id number] DOB: 19-Sep-1955   Cancelled Treatment:    Reason Eval/Treat Not Completed: Patient declined, no reason specified. Despite max encouragement and education in benefits of ADL/mobility participation, pt continued to refuse OT/PT citing various reasons (pain in feet, dizziness with sitting, fatigue from not sleeping last night, etc.) Attempted to make a plan with pt for therapy tomorrow to accommodate any preferences for time, however, pt states "you just can't plan like that." Will re-attempt at later date/time as pt is willing.   Ardeth Perfect., MPH, MS, OTR/L ascom [phone] 11/20/21, 3:20 PM

## 2021-11-20 NOTE — Progress Notes (Signed)
PROGRESS NOTE    Gary [name]  IEP:[id number] DOB: [birth year] DOA: 09/19/2021 PCP: [facility] [place]   Brief Narrative: This 66 y/o male with multiple medical problems causing chronic hypercapneic respiratory failure who has had admissions in the past for exacerbations of the same and mechanical ventilation presented for evaluation of dyspnea, weakness. It is unclear if the patient has been compliant with his medications and NIMV at home.  He was admitted by the hospitalists for a presumed COPD exacerbation in the ER, treated with bronchodilators and NIMV but his mental status and hypercarbia worsened despite those interventions so he required intubation.   6/6: Presented to ED.  Required intubation and mechanical ventilation in the ED.  PCCM admitted in ICU 6/7: Hold diuresis due to worsening Creatinine.  ENT consulted for Trach placement per family request 6/8: Pt continues to spike temps tmax 101.8 degrees F.  Flexiseal placed overnight draining foul smelling watery stool~Cdiff and GI panel negative 6/9: Fever resolved.  Negative for C- diff, GI panel still pending.  Creatinine slightly worsened, requiring low dose levophed (2 mcg). Holding diuresis 6/10: propofol discontinued due hypertriglyceridemia; failed SBT 6/11: Tracheal aspirate from 6/6 resulting with Pseudomonas, ABX changed to Cefepime. neurologically intact, awaiting Trach placement 6/12: Pt calmer today following addition of oral benzo's, narcotics, and Seroquel via tube.  Creatinine worsened with decreased UOP, holding diuresis, low threshold for Nephrology consult  6/13: Pt with worsening renal failure creatinine 4.15.  UOP overnight 25 ml.  Nephrology consulted plans to start pt on hemodialysis follow dialysis catheter placement Pt underwent hemodialysis without ultrafiltration 06/14: .  Plans for HD session today.  Agitation has improved significantly with narcotics and antianxiety medications per tube   06/16:  Tracheostomy canceled 06/15 due to hyperkalemia and tentatively rescheduled for 06/20  6/18 failure to wean from vent 06/20: ENT placed Tracheostomy (#7.0 XLT cuffed).  Developed mild epistaxis post unsuccessful attempt at NGT placement.  Later developed hematochezia/melena, Protonix gtt started and GI consulted. 06/21: Diuresed yesterday with limited results. Patient had softed blood pressures throughout the day and diuresis was held as a result. HD was performed by the patient clotted off the machine and was not able to be rinsed back. Repeat hemoglobin is above 8. 06/23: Pt remains mechanically intubated settings: FiO2 50%/PEEP 10.  CXR concerning for moderate right pleural effusion with associated right basilar atelectasis vs. infiltrate.  Will perform [place] Chest to determine if pt needs right sided thoracentesis  06/24: Remains on the ventilator, FiO2 requirements decreasing, respiratory culture with abundant gram-negative rods: [name], on Meropenem 06/25: Oxygen requirements down to 40%, chest x-ray shows improvement on dense right consolidation, Pseudomonas susceptibilities pending 06/25: Pt remains on the ventilator settings: PEEP 10 and FiO2 40%; will attempt to wean PEEP to 5 to perform SBT  06/28: Pt tolerated PS on 06/27.  Overnight pt had episodes of vomiting TF's suspect secondary to malposition of nasogastric tube.  Will consult IR for PEG tube placement if unable to place PEG tube pt will need postpyloric Dobbhoff   06/29: Pt tolerated PS 5/5 for over an hour yesterday, however due to fatigue placed back on PRVC.  Pt pulled NGT out several times overnight, therefore will not attempt to reinsert.  Plans for PEG tube placement per IR on 06/30.   06/30: PEG tube placed per IR  07/1: Pt tolerated trach collar _0 % throughout the day 07/2: Pt placed on ventilator overnight settings 16/5/35% tolerating well.  Will place pt on trach  collar today and place orders for PT/OT 7/3:  Hemodynamically stable.  Tolerating TC (has remained on TC since 7/2 @ 08:00), plan for speech therapy and PT/OT 7/4: Has maintained on trach collar for greater than 48 hours, tolerating Passy-Muir valve.  Plan to transfer out of ICU. SLP recs: dysphagia 1 (puree) w/ nectar liquid.  7/5-7/6: sodium and creatinine trending appropriately, Hgb stable. Low K repleted and stable. Was off vent qhs (orders d/c or missed?) and was restarted. Will need LTAC, TOC following  7/7: pulled out trach overnight. Confusion re: vent plan among staff and myself. I asked PCCM to reevluate him and verify plan so the team and the patient can eb on the same page, and will help TOC arrange appropriate placement.  7/8: I spoke personally w/ Dr Milon Dikes [facility]) and he confirms patient does NOT need ventilator support, unless of course develops distress, but last night the patient tolerated well without it. Plan to meet w/ family today when they visit, I spoke w/ his sister yesterday on the phone. No concerns on CBC/BMP: Cr tending down a bit, Hgb stable, Na slightly high but improving over past few days. He is approaching medically stable and may be appropriate for LTAC vs SNF soon  7/9: Several long discussions with patient and family about medical condition, behavioral issues.  See separate progress notes.  Psychiatry evaluated, agree that he has capacity, started SSRI. 7/10: Patient remains medically stable, awaiting placement.  Discussed his behavioral issues again and encouraged him to always bring any concerns to nursing staff or to me calmly and I am happy to listen, requested he maintain professionalism towards staff, he seems amenable. 7/11-7/14: goal OOB to chair, needing hoyer lift, pt reeducated several times about staying in bed 07/11 and has been compliant through 07/14. Finally got bariatric chair and hoyer lift 7/12 and was OOB 07/13.  D/c monitoring and sitter and transferred to [place] status 07/13. Awaiting placement.   7/15-17- working to reorient patient, but personality and coherence remain difficult. Stable for placement. Reminded patient not to verbally abuse staff 7/28 - rising O2 needs past couple of days, due to mucus plugging.  ABG stable, normal mentation.  Improved after suctioning. 8/2: Patient awaiting placement in a long-term acute care setting.. 8/3: Patient had multiple episodes of maroon-colored big bowel movements through out night.  H&H remains stable, vitals stable but patient at high risk for decompensation.  Patient transferred to ICU.  2 unit PRBC given, GI Dr. Lynnell Jude notified,  Nuclear scan positive for active GI bleeding in the proximal colon, vascular surgery Dr. Delana Meyer and ICU Dr. Lanney Gins notified.  Patient underwent embolization of the bleeding artery. 8/4: Patient continues to have bright red blood per rectum.  Patient underwent flex sigmoidoscopy showed blood in the rectum, rectosigmoid colon, diverticulosis.  Hemoglobin dropped to 7.6.  Transfused 2 unit PRBC.  If patient continues to bleed may require general surgery evaluation. 8/5-8/7: Patient remains hemodynamically stable, Patient reports very little blood in the stools.  Hemoglobin dropped to 7.3 given 1 unit of PRBC.  Assessment & Plan:   Principal Problem:   Acute on chronic respiratory failure with hypoxia and hypercapnia ([name]) Active Problems:   Major depressive disorder, recurrent episode, moderate ([name])   Bloody stool   COPD with acute exacerbation ([name])   Acute metabolic encephalopathy   HTN (hypertension)   Acute on chronic diastolic congestive heart failure ([name])   Obesity, Class III, BMI 40-49.9 (morbid obesity) ([place])   Obstructive sleep apnea  Chronic kidney disease, stage 3a ([place])   HLD (hyperlipidemia)   Pressure injury of skin   Hematochezia  Acute GI bleed /acute blood loss anemia: Patient continued to have maroon-colored big bowel movements.   Nuclear scan positive for active GI bleeding in the  proximal colon. Vascular surgery Dr. Delana Meyer has attempted embolization of the bleeding artery. Dr. Allen [name] with GI attempted flex sigmoidoscopy which shows blood in the sigmoid colon, rectosigmoid, diverticulum.  H&H dropped.  Transfused 2 unit PRBC.  Keep hemoglobin above 8. Monitor H&H every 6 hours, Monitor for signs and symptoms of bleeding. Continue Protonix 40 mg IV every 12 hours daily. If patient continues to bleed may require general surgery evaluation. Patient remains hemodynamically stable, Patient reports very little blood in the stools.   Hemoglobin dropped to 7.3 given 1 unit of PRBC.  Follow H&H.   Left foot blood blister - appreciate podiatry evaluation; He underwent removal of blister at bedside by podiatry on 7/21 -  Use prevalon boots to aid with pressure wounds going forward.   Acute on Chronic Hypoxic Hypercapnic Respiratory Failure Acute exacerbation COPD Pseudomonal PNA OSA/OHS - S/p  tracheostomy by ENT by 6/20. - Continue Trach collar w/ ventilator support as needed / qhs but he does NOT need vent support routinely per conversation w/ Dr Milon Dikes 07/08 - Completed 10 day course of ceftazidime for treatment of Pseudomonas. - At this point, he is medically stable but placement is going to be an issue for him.   -  His behavioral issues have been discussed with him and his behavior has improved  - Monitor mental status closely and check blood gas PRN - Has OHS and was reliant on nightly bipap prior to trach 7/29-8/2: stable on 10 L/min, no reports of significant de-sat episodes.   Bilateral hand and foot pain -  He has hx of gout, treated for a few days with colchicine last week.   Now having diffuse joint pains in hands and feet.   Pt and family  reports hx. of rheumatoid arthritis.   Uric acid level was normal last week ESR 48, CRP 4.2 - nonspecific Rheumatoid factor elevated 58.8 --Monitor for improvement   Acute decompensated HFpEF Hypertension -  Echocardiogram 08/07/2021: LVEF 60 to 99%, grade 1 diastolic dysfunction, normal RV systolic function - Was on intermittent hemodialysis for volume removal. - has not needed HD and has remained compensated. Cancelled permacath placement  - resumed home torsemide 7/27   AKI superimposed CKD stage IIIa > Resolved Hypokalemia > Resolved. Hypernatremia > Resolved. 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 Diff negative.   Acute Blood Loss Anemia - resolved  Nasopharyngeal trauma vs Upper GI bleed - Continue BID IV PPI - per GI, no need for scope    Hyperglycemia - CBG's achs; patient refusing at times - SSI  Acute Metabolic Encephalopathy in setting of CO2 Narcosis Sedation needs in setting of mechanical ventilation - CT Head 09/19/21: negative;  UDS negative - Treatment of hypercapnia as outlined above   Trach in place Gtube in place - No longer requiring vent support - PO intake has improved, SLP and dietary following  -Continue trach collar supplemental O2 to maintain sats >88% - Continues to eat well; G-tube will have to remain in place at least 3 months prior to removal    Morbid obesity: Estimated body mass index is 52.12 kg/m as calculated from  the following:   Height as of this encounter: $RemoveBeforeD'6\' 3"'[id number]$  (1.905 m).   Weight as of this encounter: 189.1 kg.  This  complicate the hospital course. Diet and exercise discussed in detail.   DVT prophylaxis: SCDs Code Status: Full code. Family Communication: (No family at bed side) Disposition Plan:   Status is: Inpatient Remains inpatient appropriate because: Awaiting SNF placement. Barriers; remains with behavioral issues.    Consultants:  PCCU Nephrology ENT GI IR Psychiatry  Procedures:  10/03/21: tracheostomy placement 10/12/21: dialysis permacath placement  10/13/21: G-tube placed    Antimicrobials:   Anti-infectives (From admission, onward)    Start     Dose/Rate Route Frequency Ordered Stop   11/16/21 1651  ceFAZolin (ANCEF) IVPB 1 g/50 mL premix        over 30 Minutes  Continuous PRN 11/16/21 1651 11/16/21 1837   11/16/21 1559  ceFAZolin (ANCEF) IVPB 2g/100 mL premix        2 g 200 mL/hr over 30 Minutes Intravenous 30 min pre-op 11/16/21 1559 11/16/21 1700   10/12/21 1200  cefTAZidime (FORTAZ) 2 g in sodium chloride 0.9 % 100 mL IVPB        2 g 200 mL/hr over 30 Minutes Intravenous 2 times daily 10/12/21 1122 10/17/21 0844   10/12/21 0417  ceFAZolin (ANCEF) IVPB 1 g/50 mL premix  Status:  Discontinued        1 g 100 mL/hr over 30 Minutes Intravenous 30 min pre-op 10/12/21 0417 10/16/21 0751   10/09/21 2000  ceFEPIme (MAXIPIME) 1 g in sodium chloride 0.9 % 100 mL IVPB  Status:  Discontinued        1 g 200 mL/hr over 30 Minutes Intravenous Every 24 hours 10/09/21 1404 10/09/21 1515   10/09/21 2000  cefTAZidime (FORTAZ) 1 g in sodium chloride 0.9 % 100 mL IVPB  Status:  Discontinued        1 g 200 mL/hr over 30 Minutes Intravenous Every 24 hours 10/09/21 1515 10/12/21 1122   10/06/21 1830  meropenem (MERREM) 500 mg in sodium chloride 0.9 % 100 mL IVPB  Status:  Discontinued        500 mg 200 mL/hr over 30 Minutes Intravenous Every 24 hours 10/06/21 1735 10/09/21 1403   10/06/21 1800  doxycycline (VIBRAMYCIN) 100 mg in sodium chloride 0.9 % 250 mL IVPB  Status:  Discontinued        100 mg 125 mL/hr over 120 Minutes Intravenous Every 12 hours 10/06/21 1657 10/08/21 0838   09/29/21 1000  ceFEPIme (MAXIPIME) 2 g in sodium chloride 0.9 % 100 mL IVPB        2 g 200 mL/hr over 30 Minutes Intravenous Every 12 hours 09/29/21 0812 09/30/21 2342   09/26/21 2200  ceFEPIme (MAXIPIME) 1 g in sodium chloride 0.9 % 100 mL IVPB  Status:  Discontinued        1 g 200 mL/hr over 30 Minutes Intravenous Every 24 hours 09/26/21 1154 09/29/21 0812   09/26/21 2000  ceFEPIme (MAXIPIME) 2 g in sodium  chloride 0.9 % 100 mL IVPB  Status:  Discontinued        2 g 200 mL/hr over 30 Minutes Intravenous Every 12 hours 09/26/21 0809 09/26/21 1154   09/24/21 1200  ceFEPIme (MAXIPIME) 2 g in sodium chloride 0.9 % 100 mL IVPB  Status:  Discontinued        2 g 200 mL/hr over 30 Minutes Intravenous Every 8 hours 09/24/21 0950 09/26/21 0809  09/21/21 1430  azithromycin (ZITHROMAX) 500 mg in sodium chloride 0.9 % 250 mL IVPB        500 mg 250 mL/hr over 60 Minutes Intravenous Every 24 hours 09/21/21 1344 09/23/21 1543   09/21/21 1430  cefTRIAXone (ROCEPHIN) 2 g in sodium chloride 0.9 % 100 mL IVPB  Status:  Discontinued        2 g 200 mL/hr over 30 Minutes Intravenous Every 24 hours 09/21/21 1344 09/24/21 2725        Subjective: Patient was seen and examined at bedside.  Overnight events noted.   Patient reports feeling better,  he still has very little blood in the stools but feels fine. He was given 1 unit PRBC for drop in hemoglobin to 7.3.   Objective: Vitals:   11/20/21 0515 11/20/21 0528 11/20/21 0744 11/20/21 0837  BP: 119/68   111/66  Pulse: 85   80  Resp: 20   (!) 22  Temp: 98.6 F (37 C)   98.4 F (36.9 C)  TempSrc: Oral   Oral  SpO2: 96%  96% 95%  Weight:  (!) 189.1 kg    Height:        Intake/Output Summary (Last 24 hours) at 11/20/2021 1202 Last data filed at 11/20/2021 0800 Gross per 24 hour  Intake 421.67 ml  Output 1050 ml  Net -628.33 ml    Filed Weights   11/17/21 0316 11/18/21 0428 11/20/21 0528  Weight: (!) 180.5 kg (!) 184.6 kg (!) 189.1 kg    Examination:  General exam: Appears comfortable, not in any acute distress, morbidly obese, deconditioned. Respiratory system: CTA bilaterally, respiratory effort normal, no accessory muscle use, RR 19 HEENT : Trach with PMV in place, trach collar Cardiovascular system: S1-S2 heard, regular rate and rhythm, no murmur Gastrointestinal system: Abdomen is soft, non tender, non distended, PEG noted, normal bowel  sounds. Central nervous system: Alert and oriented x 3. No focal neurological deficits. Extremities: No edema, no cyanosis, no clubbing. Skin: No rashes, lesions or ulcers Psychiatry: Judgement and insight appear normal. Mood & affect appropriate.     Data Reviewed: I have personally reviewed following labs and imaging studies  CBC: Recent Labs  Lab 11/16/21 1925 11/17/21 0629 11/17/21 1151 11/17/21 2102 11/19/21 0646  WBC  --  11.8*  --   --  10.4  NEUTROABS  --   --   --   --  7.4  HGB 9.3* 8.2* 7.6* 7.9* 7.3*  HCT 33.9* 29.5* 27.7* 27.6* 26.3*  MCV  --  80.2  --   --  80.9  PLT  --  202  --   --  PLATELET CLUMPS NOTED ON SMEAR, UNABLE TO ESTIMATE    Basic Metabolic Panel: Recent Labs  Lab 11/14/21 0350 11/15/21 1118 11/17/21 0629 11/18/21 0511 11/19/21 0646  NA 139 140 138 139 136  K 4.2 4.1 4.8 4.2 4.2  CL 97* 97* 100 99 100  CO2 34* 36* 32 32 28  GLUCOSE 122* 124* 113* 109* 123*  BUN 37* 30* 35* 34* 25*  CREATININE 1.33* 1.19 1.42* 1.35* 1.26*  CALCIUM 8.4* 8.4* 8.2* 8.3* 8.0*  MG  --   --   --  2.1 2.1  PHOS  --   --   --  3.7 3.6    GFR: Estimated Creatinine Clearance: 105.9 mL/min (A) (by C-G formula based on SCr of 1.26 mg/dL (H)). Liver Function Tests: Recent Labs  Lab 11/18/21 0511  AST 9*  ALT 10  ALKPHOS 57  BILITOT 0.6  PROT 6.3*  ALBUMIN 2.7*    No results for input(s): "LIPASE", "AMYLASE" in the last 168 hours. No results for input(s): "AMMONIA" in the last 168 hours. Coagulation Profile: Recent Labs  Lab 11/16/21 1132  INR 1.0    Cardiac Enzymes: No results for input(s): "CKTOTAL", "CKMB", "CKMBINDEX", "TROPONINI" in the last 168 hours. BNP (last 3 results) No results for input(s): "PROBNP" in the last 8760 hours. HbA1C: No results for input(s): "HGBA1C" in the last 72 hours. CBG: No results for input(s): "GLUCAP" in the last 168 hours.  Lipid Profile: No results for input(s): "CHOL", "HDL", "LDLCALC", "Gary", "CHOLHDL",  "LDLDIRECT" in the last 72 hours. Thyroid Function Tests: No results for input(s): "TSH", "T4TOTAL", "FREET4", "T3FREE", "THYROIDAB" in the last 72 hours. Anemia Panel: No results for input(s): "VITAMINB12", "FOLATE", "FERRITIN", "TIBC", "IRON", "RETICCTPCT" in the last 72 hours. Sepsis Labs: No results for input(s): "PROCALCITON", "LATICACIDVEN" in the last 168 hours.  No results found for this or any previous visit (from the past 240 hour(s)).   Radiology Studies: No results found.  Scheduled Meds:  (feeding supplement) PROSource Plus  30 mL Oral TID PC & HS   arformoterol  15 mcg Nebulization BID   vitamin C  500 mg Oral BID   atorvastatin  10 mg Oral Daily   Chlorhexidine Gluconate Cloth  6 each Topical Daily   cholecalciferol  2,000 Units Oral Daily   escitalopram  5 mg Oral Daily   free water  30 mL Per Tube Q4H   gabapentin  300 mg Oral Q8H   mouth rinse  15 mL Mouth Rinse 4 times per day   pantoprazole (PROTONIX) IV  40 mg Intravenous Q12H   sodium chloride flush  3 mL Intravenous Q12H   Continuous Infusions:  sodium chloride Stopped (10/07/21 0542)   sodium chloride       LOS: 62 days    Time spent: 35 Mins  Time spent 35 minutes in management and coordination of care.    Shawna Clamp, MD [facility]   If 7PM-7AM, please contact night-coverage

## 2021-11-20 NOTE — Progress Notes (Signed)
PT Cancellation Note  Patient Details Name: Gary [name] MRN: [id number] DOB: 1955/05/21   Cancelled Treatment:    Reason Eval/Treat Not Completed: Patient declined, no reason specified.  Pt received multiple attempts from therapist to participate in therapy with PT/OT, but he continued to decline.  Pt educated on the importance of mobility and the benefits it brings, with pt continuing to decline.  Will re-attempt as medically appropriate at later date/time.   Gwenlyn Saran, PT, DPT 11/20/21, 3:24 PM

## 2021-11-21 DIAGNOSIS — J9621 Acute and chronic respiratory failure with hypoxia: Secondary | ICD-10-CM | POA: Diagnosis not present

## 2021-11-21 DIAGNOSIS — J9622 Acute and chronic respiratory failure with hypercapnia: Secondary | ICD-10-CM | POA: Diagnosis not present

## 2021-11-21 DIAGNOSIS — Z7189 Other specified counseling: Secondary | ICD-10-CM | POA: Diagnosis not present

## 2021-11-21 NOTE — Progress Notes (Signed)
Nutrition Follow Up Note   DOCUMENTATION CODES:   Morbid obesity  INTERVENTION:   Pro-Source 79m TID via tube, provides 40kcal and 11g of protein per serving   Double protein with meals   Free water flushes 344mq4 hours via tube to maintain tube patency   Vitamin C 50063mo BID  Vitamin D 2000 units po daily   NUTRITION DIAGNOSIS:   Inadequate oral intake related to inability to eat (pt sedated and ventilated) as evidenced by NPO status. -resolving  GOAL:   Patient will meet greater than or equal to 90% of their needs -met with tube feeds   MONITOR:   PO intake, Supplement acceptance, Labs, Weight trends, TF tolerance, Skin, I & O's  ASSESSMENT:   [age]o male with h/o PUD, OSA, HTN, CKD III, substance abuse, CHF, Hep C and COPD and COVID 19 (02/2021) who is admitted with COPD exacerbation, CHF and AKI. 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  -Pt s/p tracheostomy 6/20 -Pt s/p 55F IR G-tube 6/30  No more GIB noted. Pt continues to have good appetite and oral intake; pt eating 100% of meals. G-tube remains in place; pt refuses nutrition support via tube. Pt will sometimes take ProSource TF via tube and sometimes refuses. Pt refusing Juven so this was discontinued. Per chart, pt has remained fairly weight stable since admission.   Medications reviewed and include: vitamin C, vitamin D, protonix  Labs reviewed: K 4.2 wnl, BUN 25(H), creat 1.26(H), P 3.6 wnl, Mg 2.1 wnl Hgb 7.9(L), Hct 27.7(L)- 8/7  Diet Order:    Diet Order             DIET SOFT Room service appropriate? Yes; Fluid consistency: Thin  Diet effective now                  EDUCATION NEEDS:   No education needs have been identified at this time  Skin:  Skin Assessment: Skin Integrity Issues: Skin Integrity Issues:: Incisions, Stage II, Other (Comment) Stage II: medial perineum Incisions: closed neck s/p trach Other: MASD to rt groin  Last BM:  8/8- type 6  Height:    Ht Readings from Last 1 Encounters:  11/03/21 '6\' 3"'  (1.905 m)    Weight:   Wt Readings from Last 1 Encounters:  11/21/21 (!) 187.3 kg    Ideal Body Weight:  89 kg  BMI:  Body mass index is 51.62 kg/m.  Estimated Nutritional Needs:   Kcal:  2800-3100kcal/day  Protein:  >145g/day  Fluid:  2.0L/day  CasKoleen Distance, RD, LDN Please refer to [facility] and/or RD on-call/weekend/after hours pager

## 2021-11-21 NOTE — Progress Notes (Signed)
Daily Progress Note   Patient Name: Gary [name]       Date: 11/21/2021 DOB: [birth year]  Age: 66 y.o. MRN#: [id number] Attending Physician: Shawna Clamp, MD Primary Care Physician: [facility] [place] Date: 09/19/2021  Reason for Consultation/Follow-up: Establishing goals of care  Subjective: Notes and labs reviewed. In to see patient. He has trach collar and PMV in place. He is resting in bed with lights out watching t.v.    We discussed his admission up to this point and reason for trach. Discussed his overall goal of going home. Discussed independence and autonomy. Discussed need for working with PT/OT to regain/maintain strength. He discusses a friend who is a quadriplegic. He states his friend has a Marine scientist who lives in house, and "you can't hardly tell he has the trach". He states his friend was told he would not live over 10 years and has been living 40 years.   He states he is afraid of needing to go to a rehab as he would lose his apartment while his check is going to the rehab.   Confirms full code/ full scope.   Length of Stay: 31  Current Medications: Scheduled Meds:   (feeding supplement) PROSource Plus  30 mL Oral TID PC & HS   arformoterol  15 mcg Nebulization BID   vitamin C  500 mg Oral BID   atorvastatin  10 mg Oral Daily   Chlorhexidine Gluconate Cloth  6 each Topical Daily   cholecalciferol  2,000 Units Oral Daily   escitalopram  5 mg Oral Daily   free water  30 mL Per Tube Q4H   gabapentin  300 mg Oral Q8H   lidocaine  1 patch Transdermal Daily   mouth rinse  15 mL Mouth Rinse 4 times per day   pantoprazole (PROTONIX) IV  40 mg Intravenous Q12H   sodium chloride flush  3 mL Intravenous Q12H    Continuous Infusions:  sodium chloride Stopped  (10/07/21 0542)   sodium chloride      PRN Meds: sodium chloride, acetaminophen, acetaminophen, albuterol, benzonatate, bisacodyl, clonazepam, hydrOXYzine, loperamide, Muscle Rub, nitroGLYCERIN, ondansetron (ZOFRAN) IV, pentafluoroprop-tetrafluoroeth, polyethylene glycol, QUEtiapine, sodium chloride flush  Physical Exam Pulmonary:     Comments: Trach collar and PMV in place.  Neurological:     Mental Status: He  is alert.             Vital Signs: BP 121/71 (BP Location: Left Wrist)   Pulse 81   Temp 98.7 F (37.1 C) (Oral)   Resp 20   Ht '6\' 3"'$  (1.905 m)   Wt (!) 187.3 kg   SpO2 98%   BMI 51.62 kg/m  SpO2: SpO2: 98 % O2 Device: O2 Device: Tracheostomy Collar O2 Flow Rate: O2 Flow Rate (L/min): 5 L/min  Intake/output summary:  Intake/Output Summary (Last 24 hours) at 11/21/2021 1050 Last data filed at 11/21/2021 6948 Gross per 24 hour  Intake 958 ml  Output 450 ml  Net 508 ml   LBM: Last BM Date : 11/20/21 Baseline Weight: Weight: (!) 171 kg Most recent weight: Weight: (!) 187.3 kg    Patient Active Problem List   Diagnosis Date Noted   Hematochezia    Bloody stool 11/12/2021   Major depressive disorder, recurrent episode, moderate ([place]) 10/22/2021   Pressure injury of skin 09/27/2021   Acute on chronic respiratory failure with hypercapnia ([name]) 08/07/2021   COPD (chronic obstructive pulmonary disease) ([name])    HLD (hyperlipidemia)    Iron deficiency anemia    Chronic kidney disease, stage 3a ([place]) 07/22/2021   Acute cardiac pulmonary edema ([name]) 07/22/2021   Acute on chronic respiratory failure with hypoxia and hypercapnia ([name]) 07/20/2021   Chronic obstructive pulmonary disease ([name])    Positive D dimer    CHF exacerbation ([name]) 05/03/2021   Acute CHF (congestive heart failure) ([place]) 04/04/2021   Weakness    Pneumonia 03/11/2021   Acute respiratory failure with hypoxia and hypercapnia ([name]) 03/11/2021   (HFpEF) heart failure with preserved ejection fraction  ([name])    Chronic respiratory failure with hypoxia ([name])    Hyperlipidemia 02/19/2021   Acute respiratory distress syndrome (ARDS) due to COVID-19 virus ([place])    Acute on chronic respiratory failure ([name]) 02/18/2021   Generalized osteoarthritis of multiple sites 10/31/2020   AKI (acute kidney injury) ([place]) 03/04/2020   COPD with acute exacerbation ([place]) 03/02/2020   Marijuana use 01/17/2020   Thrombocytopenia ([place]) 01/17/2020   Positive hepatitis C antibody test 01/17/2020   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 mass index (BMI) of 50.0 to 59.9 in adult [facility]) 01/17/2020   Osteoarthritis of glenohumeral joints (Bilateral) 01/17/2020   Osteoarthritis of AC (acromioclavicular) joints (Bilateral) 01/17/2020   Polysubstance abuse ([place]) 54/62/7035   Acute metabolic encephalopathy 00/93/8182   Chronic pain syndrome 07/14/2019   DDD (degenerative disc disease), lumbosacral 07/14/2019   DDD (degenerative disc disease), cervical 07/14/2019   CHF (congestive heart failure) ([place]) 04/12/2017   Acute on chronic diastolic CHF (congestive heart failure) ([place]) 02/28/2015   Obstructive sleep apnea 02/28/2015   Acute on chronic diastolic congestive heart failure ([name])    Acute on chronic respiratory failure with hypoxia ([name])    Obesity, Class III, BMI 40-49.9 (morbid obesity) ([place]) 04/01/2014   HTN (hypertension) 04/01/2014    Palliative Care Assessment & Plan    Recommendations/Plan: Continue full code/ full scope Discussed working with PT/OT to regain/maintain strength.   Code Status:    Code Status Orders  (From admission, onward)           Start     Ordered   09/19/21 1142  Full code  Continuous        09/19/21 1142           Code Status History     Date Active Date Inactive  Code Status Order ID Comments User Context   08/07/2021 1057 08/15/2021 2025 Full Code [id number]  Ivor Costa, MD ED   07/20/2021 1625 07/23/2021 1921 Full Code [id number]  Bradly Bienenstock, NP  ED   05/03/2021 2155 05/08/2021 1908 Full Code [id number]  Athena Masse, MD ED   04/04/2021 1923 04/12/2021 0031 Full Code [id number]  Mansy, Arvella Merles, MD ED   03/11/2021 0156 03/21/2021 2310 Full Code [id number]  Athena Masse, MD Inpatient   02/18/2021 2114 02/23/2021 0759 Full Code [id number]  Rust-Chester, Huel Cote, NP ED   02/25/2020 1310 03/05/2020 2206 Full Code [id number]  [name], MD ED   08/04/2019 1910 08/10/2019 2239 Full Code [id number]  Mansy, Arvella Merles, MD ED   05/26/2019 1806 06/01/2019 1711 Full Code [id number]  Loletha Grayer, MD ED   10/14/2018 0330 10/22/2018 1951 Full Code [id number]  Lance Coon, MD Inpatient   06/25/2018 0026 06/28/2018 1842 Full Code [id number]  Lance Coon, MD ED   05/29/2018 0001 05/31/2018 1732 Full Code [id number]  Vaughan Basta, MD Inpatient   04/12/2017 1353 04/15/2017 1720 Full Code [id number]  Fritzi Mandes, MD Inpatient   01/10/2015 1953 01/14/2015 1703 Full Code [id number]  Aldean Jewett, MD Inpatient   03/27/2014 1847 04/02/2014 1512 Full Code [id number]  Doreen Salvage, MD ED       Care plan was discussed with RN  Thank you for allowing the Palliative Medicine Team to assist in the care of this patient.   Asencion Gowda, NP  Please contact Palliative Medicine Team phone at [phone] for questions and concerns.

## 2021-11-21 NOTE — Progress Notes (Signed)
PROGRESS NOTE    Gary [name]  IEP:[id number] DOB: [birth year] DOA: 09/19/2021 PCP: [facility] [place]   Brief Narrative: This 66 y/o male with multiple medical problems causing chronic hypercapneic respiratory failure who has had admissions in the past for exacerbations of the same and mechanical ventilation presented for evaluation of dyspnea, weakness. It is unclear if the patient has been compliant with his medications and NIMV at home.  He was admitted by the hospitalists for a presumed COPD exacerbation in the ER, treated with bronchodilators and NIMV but his mental status and hypercarbia worsened despite those interventions so he required intubation.   6/6: Presented to ED.  Required intubation and mechanical ventilation in the ED.  PCCM admitted in ICU 6/7: Hold diuresis due to worsening Creatinine.  ENT consulted for Trach placement per family request 6/8: Pt continues to spike temps tmax 101.8 degrees F.  Flexiseal placed overnight draining foul smelling watery stool~Cdiff and GI panel negative 6/9: Fever resolved.  Negative for C- diff, GI panel still pending.  Creatinine slightly worsened, requiring low dose levophed (2 mcg). Holding diuresis 6/10: propofol discontinued due hypertriglyceridemia; failed SBT 6/11: Tracheal aspirate from 6/6 resulting with Pseudomonas, ABX changed to Cefepime. neurologically intact, awaiting Trach placement 6/12: Pt calmer today following addition of oral benzo's, narcotics, and Seroquel via tube.  Creatinine worsened with decreased UOP, holding diuresis, low threshold for Nephrology consult  6/13: Pt with worsening renal failure creatinine 4.15.  UOP overnight 25 ml.  Nephrology consulted plans to start pt on hemodialysis follow dialysis catheter placement Pt underwent hemodialysis without ultrafiltration 06/14: .  Plans for HD session today.  Agitation has improved significantly with narcotics and antianxiety medications per tube   06/16:  Tracheostomy canceled 06/15 due to hyperkalemia and tentatively rescheduled for 06/20  6/18 failure to wean from vent 06/20: ENT placed Tracheostomy (#7.0 XLT cuffed).  Developed mild epistaxis post unsuccessful attempt at NGT placement.  Later developed hematochezia/melena, Protonix gtt started and GI consulted. 06/21: Diuresed yesterday with limited results. Patient had softed blood pressures throughout the day and diuresis was held as a result. HD was performed by the patient clotted off the machine and was not able to be rinsed back. Repeat hemoglobin is above 8. 06/23: Pt remains mechanically intubated settings: FiO2 50%/PEEP 10.  CXR concerning for moderate right pleural effusion with associated right basilar atelectasis vs. infiltrate.  Will perform [place] Chest to determine if pt needs right sided thoracentesis  06/24: Remains on the ventilator, FiO2 requirements decreasing, respiratory culture with abundant gram-negative rods: [name], on Meropenem 06/25: Oxygen requirements down to 40%, chest x-ray shows improvement on dense right consolidation, Pseudomonas susceptibilities pending 06/25: Pt remains on the ventilator settings: PEEP 10 and FiO2 40%; will attempt to wean PEEP to 5 to perform SBT  06/28: Pt tolerated PS on 06/27.  Overnight pt had episodes of vomiting TF's suspect secondary to malposition of nasogastric tube.  Will consult IR for PEG tube placement if unable to place PEG tube pt will need postpyloric Dobbhoff   06/29: Pt tolerated PS 5/5 for over an hour yesterday, however due to fatigue placed back on PRVC.  Pt pulled NGT out several times overnight, therefore will not attempt to reinsert.  Plans for PEG tube placement per IR on 06/30.   06/30: PEG tube placed per IR  07/1: Pt tolerated trach collar _0 % throughout the day 07/2: Pt placed on ventilator overnight settings 16/5/35% tolerating well.  Will place pt on trach  collar today and place orders for PT/OT 7/3:  Hemodynamically stable.  Tolerating TC (has remained on TC since 7/2 @ 08:00), plan for speech therapy and PT/OT 7/4: Has maintained on trach collar for greater than 48 hours, tolerating Passy-Muir valve.  Plan to transfer out of ICU. SLP recs: dysphagia 1 (puree) w/ nectar liquid.  7/5-7/6: sodium and creatinine trending appropriately, Hgb stable. Low K repleted and stable. Was off vent qhs (orders d/c or missed?) and was restarted. Will need LTAC, TOC following  7/7: pulled out trach overnight. Confusion re: vent plan among staff and myself. I asked PCCM to reevluate him and verify plan so the team and the patient can eb on the same page, and will help TOC arrange appropriate placement.  7/8: I spoke personally w/ Dr Milon Dikes [facility]) and he confirms patient does NOT need ventilator support, unless of course develops distress, but last night the patient tolerated well without it. Plan to meet w/ family today when they visit, I spoke w/ his sister yesterday on the phone. No concerns on CBC/BMP: Cr tending down a bit, Hgb stable, Na slightly high but improving over past few days. He is approaching medically stable and may be appropriate for LTAC vs SNF soon  7/9: Several long discussions with patient and family about medical condition, behavioral issues.  See separate progress notes.  Psychiatry evaluated, agree that he has capacity, started SSRI. 7/10: Patient remains medically stable, awaiting placement.  Discussed his behavioral issues again and encouraged him to always bring any concerns to nursing staff or to me calmly and I am happy to listen, requested he maintain professionalism towards staff, he seems amenable. 7/11-7/14: goal OOB to chair, needing hoyer lift, pt reeducated several times about staying in bed 07/11 and has been compliant through 07/14. Finally got bariatric chair and hoyer lift 7/12 and was OOB 07/13.  D/c monitoring and sitter and transferred to [facility] status 07/13. Awaiting placement.   7/15-17- working to reorient patient, but personality and coherence remain difficult. Stable for placement. Reminded patient not to verbally abuse staff 7/28 - rising O2 needs past couple of days, due to mucus plugging.  ABG stable, normal mentation.  Improved after suctioning. 8/2: Patient awaiting placement in a long-term acute care setting.. 8/3: Patient had multiple episodes of maroon-colored big bowel movements through out night.  H&H remains stable, vitals stable but patient at high risk for decompensation.  Patient transferred to ICU.  2 unit PRBC given, GI Dr. Lynnell Jude notified,  Nuclear scan positive for active GI bleeding in the proximal colon, vascular surgery Dr. Delana Meyer and ICU Dr. Lanney Gins notified.  Patient underwent embolization of the bleeding artery. 8/4: Patient continues to have bright red blood per rectum.  Patient underwent flex sigmoidoscopy showed blood in the rectum, rectosigmoid colon, diverticulosis.  Hemoglobin dropped to 7.6.  Transfused 2 unit PRBC.  If patient continues to bleed may require general surgery evaluation. 8/5-8/8: Patient remains hemodynamically stable, Patient reports very little blood in the stools.  1 unit PRBC given, Hb 7.9  Assessment & Plan:   Principal Problem:   Acute on chronic respiratory failure with hypoxia and hypercapnia ([name]) Active Problems:   Major depressive disorder, recurrent episode, moderate ([name])   Bloody stool   COPD with acute exacerbation ([name])   Acute metabolic encephalopathy   HTN (hypertension)   Acute on chronic diastolic congestive heart failure ([name])   Obesity, Class III, BMI 40-49.9 (morbid obesity) ([place])   Obstructive sleep apnea   Chronic  kidney disease, stage 3a ([place])   HLD (hyperlipidemia)   Pressure injury of skin   Hematochezia  Acute GI bleed /acute blood loss anemia: Patient continued to have maroon-colored big bowel movements.   Nuclear scan positive for active GI bleeding in the proximal colon. Vascular  surgery Dr. Delana Meyer has attempted embolization of the bleeding artery. Dr. Allen [name] with GI attempted flex sigmoidoscopy which shows blood in the sigmoid colon, rectosigmoid, diverticulum.  H&H dropped.  Transfused 2 unit PRBC.  Keep hemoglobin above 8. Monitor H&H every 6 hours, Monitor for signs and symptoms of bleeding. Continue Protonix 40 mg IV every 12 hours daily. If patient continues to bleed may require general surgery evaluation. Patient remains hemodynamically stable, Patient reports very little blood in the stools.   Hemoglobin dropped to 7.3, given 1 unit PRBC> Hb 7.9. Follow H&H.   Left foot blood blister - appreciate podiatry evaluation; He underwent removal of blister at bedside by podiatry on 7/21 -  Use prevalon boots to aid with pressure wounds going forward.   Acute on Chronic Hypoxic Hypercapnic Respiratory Failure Acute exacerbation COPD Pseudomonal PNA OSA/OHS - S/p  tracheostomy by ENT by 6/20. - Continue Trach collar w/ ventilator support as needed / qhs but he does NOT need vent support routinely per conversation w/ Dr Milon Dikes 07/08 - Completed 10 day course of ceftazidime for treatment of Pseudomonas. - At this point, he is medically stable but placement is going to be an issue for him.   -  His behavioral issues have been discussed with him and his behavior has improved  - Monitor mental status closely and check blood gas PRN - Has OHS and was reliant on nightly bipap prior to trach 7/29-8/2: stable on 10 L/min, no reports of significant de-sat episodes.   Bilateral hand and foot pain -  He has hx of gout, treated for a few days with colchicine last week.   Now having diffuse joint pains in hands and feet.   Pt and family  reports hx. of rheumatoid arthritis.   Uric acid level was normal last week ESR 48, CRP 4.2 - nonspecific Rheumatoid factor elevated 58.8 --Monitor for improvement   Acute decompensated HFpEF Hypertension - Echocardiogram 08/07/2021: LVEF  60 to 43%, grade 1 diastolic dysfunction, normal RV systolic function - Was on intermittent hemodialysis for volume removal. - has not needed HD and has remained compensated. Cancelled permacath placement  - resumed home torsemide 7/27   AKI superimposed CKD stage IIIa > Resolved Hypokalemia > Resolved. Hypernatremia > Resolved. 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 Diff negative.   Acute Blood Loss Anemia - resolved  Nasopharyngeal trauma vs Upper GI bleed - Continue BID IV PPI - per GI, no need for scope    Hyperglycemia - CBG's achs; patient refusing at times - SSI  Acute Metabolic Encephalopathy in setting of CO2 Narcosis Sedation needs in setting of mechanical ventilation - CT Head 09/19/21: negative;  UDS negative - Treatment of hypercapnia as outlined above   Trach in place Gtube in place - No longer requiring vent support - PO intake has improved, SLP and dietary following  -Continue trach collar supplemental O2 to maintain sats >88% - Continues to eat well; G-tube will have to remain in place at least 3 months prior to removal    Morbid obesity: Estimated body mass index is 51.62 kg/m as calculated from the  following:   Height as of this encounter: _0  (1.905 m).   Weight as of this encounter: 187.3 kg.  This  complicate the hospital course. Diet and exercise discussed in detail.   DVT prophylaxis: SCDs Code Status: Full code. Family Communication: (No family at bed side) Disposition Plan:   Status is: Inpatient Remains inpatient appropriate because: Awaiting SNF placement. Barriers; remains with behavioral issues. Ongoing chronic GI bleeding    Consultants:  PCCU Nephrology ENT GI IR Psychiatry  Procedures:  10/03/21: tracheostomy placement 10/12/21: dialysis permacath placement  10/13/21: G-tube placed    Antimicrobials:   Anti-infectives (From admission, onward)    Start     Dose/Rate Route Frequency Ordered Stop   11/16/21 1651  ceFAZolin (ANCEF) IVPB 1 g/50 mL premix        over 30 Minutes  Continuous PRN 11/16/21 1651 11/16/21 1837   11/16/21 1559  ceFAZolin (ANCEF) IVPB 2g/100 mL premix        2 g 200 mL/hr over 30 Minutes Intravenous 30 min pre-op 11/16/21 1559 11/16/21 1700   10/12/21 1200  cefTAZidime (FORTAZ) 2 g in sodium chloride 0.9 % 100 mL IVPB        2 g 200 mL/hr over 30 Minutes Intravenous 2 times daily 10/12/21 1122 10/17/21 0844   10/12/21 0417  ceFAZolin (ANCEF) IVPB 1 g/50 mL premix  Status:  Discontinued        1 g 100 mL/hr over 30 Minutes Intravenous 30 min pre-op 10/12/21 0417 10/16/21 0751   10/09/21 2000  ceFEPIme (MAXIPIME) 1 g in sodium chloride 0.9 % 100 mL IVPB  Status:  Discontinued        1 g 200 mL/hr over 30 Minutes Intravenous Every 24 hours 10/09/21 1404 10/09/21 1515   10/09/21 2000  cefTAZidime (FORTAZ) 1 g in sodium chloride 0.9 % 100 mL IVPB  Status:  Discontinued        1 g 200 mL/hr over 30 Minutes Intravenous Every 24 hours 10/09/21 1515 10/12/21 1122   10/06/21 1830  meropenem (MERREM) 500 mg in sodium chloride 0.9 % 100 mL IVPB  Status:  Discontinued        500 mg 200 mL/hr over 30 Minutes Intravenous Every 24 hours 10/06/21 1735 10/09/21 1403   10/06/21 1800  doxycycline (VIBRAMYCIN) 100 mg in sodium chloride 0.9 % 250 mL IVPB  Status:  Discontinued        100 mg 125 mL/hr over 120 Minutes Intravenous Every 12 hours 10/06/21 1657 10/08/21 0838   09/29/21 1000  ceFEPIme (MAXIPIME) 2 g in sodium chloride 0.9 % 100 mL IVPB        2 g 200 mL/hr over 30 Minutes Intravenous Every 12 hours 09/29/21 0812 09/30/21 2342   09/26/21 2200  ceFEPIme (MAXIPIME) 1 g in sodium chloride 0.9 % 100 mL IVPB  Status:  Discontinued        1 g 200 mL/hr over 30 Minutes Intravenous Every 24 hours 09/26/21 1154 09/29/21 0812   09/26/21 2000  ceFEPIme (MAXIPIME) 2 g in sodium  chloride 0.9 % 100 mL IVPB  Status:  Discontinued        2 g 200 mL/hr over 30 Minutes Intravenous Every 12 hours 09/26/21 0809 09/26/21 1154   09/24/21 1200  ceFEPIme (MAXIPIME) 2 g in sodium chloride 0.9 % 100 mL IVPB  Status:  Discontinued        2 g 200 mL/hr over 30 Minutes Intravenous Every 8 hours 09/24/21 0950 09/26/21  0809   09/21/21 1430  azithromycin (ZITHROMAX) 500 mg in sodium chloride 0.9 % 250 mL IVPB        500 mg 250 mL/hr over 60 Minutes Intravenous Every 24 hours 09/21/21 1344 09/23/21 1543   09/21/21 1430  cefTRIAXone (ROCEPHIN) 2 g in sodium chloride 0.9 % 100 mL IVPB  Status:  Discontinued        2 g 200 mL/hr over 30 Minutes Intravenous Every 24 hours 09/21/21 1344 09/24/21 1740        Subjective: Patient was seen and examined at bedside.  Overnight events noted.   Patient reports feeling better, he denies any further bleeding in the stools. He reports feels fine.  Objective: Vitals:   11/21/21 0502 11/21/21 0818 11/21/21 0831 11/21/21 1125  BP: 107/63 121/71    Pulse: 80 80 81 79  Resp: 20 (!) _0 Temp: 98.3 F (36.8 C) 98.7 F (37.1 C)    TempSrc: Oral Oral    SpO2: 96% 100% 98% 98%  Weight: (!) 187.3 kg     Height:        Intake/Output Summary (Last 24 hours) at 11/21/2021 1336 Last data filed at 11/21/2021 0525 Gross per 24 hour  Intake 958 ml  Output 450 ml  Net 508 ml    Filed Weights   11/18/21 0428 11/20/21 0528 11/21/21 0502  Weight: (!) 184.6 kg (!) 189.1 kg (!) 187.3 kg    Examination:  General exam: Appears comfortable, not in any acute distress, morbidly obese, deconditioned. Respiratory system: CTA bilaterally, respiratory effort normal, no accessory muscle use, RR 15 HEENT : Trach with PMV in place, trach collar Cardiovascular system: S1-S2 heard, regular rate and rhythm, no murmur Gastrointestinal system: Abdomen is soft, non tender, non distended, PEG noted, normal bowel sounds. Central nervous system: Alert and oriented  x 3. No focal neurological deficits. Extremities: No edema, no cyanosis, no clubbing. Skin: No rashes, lesions or ulcers Psychiatry: Judgement and insight appear normal. Mood & affect appropriate.     Data Reviewed: I have personally reviewed following labs and imaging studies  CBC: Recent Labs  Lab 11/17/21 0629 11/17/21 1151 11/17/21 2102 11/19/21 0646 11/20/21 1233  WBC 11.8*  --   --  10.4  --   NEUTROABS  --   --   --  7.4  --   HGB 8.2* 7.6* 7.9* 7.3* 7.9*  HCT 29.5* 27.7* 27.6* 26.3* 27.7*  MCV 80.2  --   --  80.9  --   PLT 202  --   --  PLATELET CLUMPS NOTED ON SMEAR, UNABLE TO ESTIMATE  --    Basic Metabolic Panel: Recent Labs  Lab 11/15/21 1118 11/17/21 0629 11/18/21 0511 11/19/21 0646  NA 140 138 139 136  K 4.1 4.8 4.2 4.2  CL 97* 100 99 100  CO2 36* 32 32 28  GLUCOSE 124* 113* 109* 123*  BUN 30* 35* 34* 25*  CREATININE 1.19 1.42* 1.35* 1.26*  CALCIUM 8.4* 8.2* 8.3* 8.0*  MG  --   --  2.1 2.1  PHOS  --   --  3.7 3.6   GFR: Estimated Creatinine Clearance: 105.2 mL/min (A) (by C-G formula based on SCr of 1.26 mg/dL (H)). Liver Function Tests: Recent Labs  Lab 11/18/21 0511  AST 9*  ALT 10  ALKPHOS 57  BILITOT 0.6  PROT 6.3*  ALBUMIN 2.7*   No results for input(s): "LIPASE", "AMYLASE" in the last 168 hours. No results for input(s): "  AMMONIA" in the last 168 hours. Coagulation Profile: Recent Labs  Lab 11/16/21 1132  INR 1.0   Cardiac Enzymes: No results for input(s): "CKTOTAL", "CKMB", "CKMBINDEX", "TROPONINI" in the last 168 hours. BNP (last 3 results) No results for input(s): "PROBNP" in the last 8760 hours. HbA1C: No results for input(s): "HGBA1C" in the last 72 hours. CBG: No results for input(s): "GLUCAP" in the last 168 hours.  Lipid Profile: No results for input(s): "CHOL", "HDL", "LDLCALC", "Gary", "CHOLHDL", "LDLDIRECT" in the last 72 hours. Thyroid Function Tests: No results for input(s): "TSH", "T4TOTAL", "FREET4",  "T3FREE", "THYROIDAB" in the last 72 hours. Anemia Panel: No results for input(s): "VITAMINB12", "FOLATE", "FERRITIN", "TIBC", "IRON", "RETICCTPCT" in the last 72 hours. Sepsis Labs: No results for input(s): "PROCALCITON", "LATICACIDVEN" in the last 168 hours.  No results found for this or any previous visit (from the past 240 hour(s)).   Radiology Studies: No results found.  Scheduled Meds:  (feeding supplement) PROSource Plus  30 mL Oral TID PC & HS   arformoterol  15 mcg Nebulization BID   vitamin C  500 mg Oral BID   atorvastatin  10 mg Oral Daily   Chlorhexidine Gluconate Cloth  6 each Topical Daily   cholecalciferol  2,000 Units Oral Daily   escitalopram  5 mg Oral Daily   free water  30 mL Per Tube Q4H   gabapentin  300 mg Oral Q8H   lidocaine  1 patch Transdermal Daily   mouth rinse  15 mL Mouth Rinse 4 times per day   pantoprazole (PROTONIX) IV  40 mg Intravenous Q12H   sodium chloride flush  3 mL Intravenous Q12H   Continuous Infusions:  sodium chloride Stopped (10/07/21 0542)   sodium chloride       LOS: 63 days    Time spent: 35 Mins  Time spent 35 minutes in management and coordination of care.    Shawna Clamp, MD [facility]   If 7PM-7AM, please contact night-coverage

## 2021-11-21 NOTE — Progress Notes (Signed)
PT Cancellation Note  Patient Details Name: Gary [name] MRN: [id number] DOB: 1955/12/20   Cancelled Treatment:    Reason Eval/Treat Not Completed: Other (comment) (Pt declines cues for PT session multiple times, cites multiple complaints/medical reasons as his desire to postpone PT to next day. Author makes attempts to education pt as to scope of PT in acute setting, his specific citations address as not typically reasons to avoid participation in our services. These attempt for education are taken as argumentative by patient, who becomes agitated, expressing frustration that his concerns are not being heard and that staff uniformly does not take his concerns to heart. Author informs patient that [facility]ia is indeed listening. Pt declines PT until next day. This conversation is not in the stage of bartering what a limited session might look like taking pt's acute limitations into consideration.   2:29 PM, 11/21/21 Gary [name], PT, DPT Physical Therapist - [facility]  [phone] ([place])     [place] [facility] 11/21/2021, 2:27 PM

## 2021-11-21 NOTE — Plan of Care (Signed)
Pt AAOx4, reports neuropathic pain primarily in his L foot. Otherwise stable. Plan for mobility as tolerated and pain control. Bed in lowest position, call light within reach. Will continue to monitor.

## 2021-11-22 DIAGNOSIS — J9622 Acute and chronic respiratory failure with hypercapnia: Secondary | ICD-10-CM | POA: Diagnosis not present

## 2021-11-22 DIAGNOSIS — J9621 Acute and chronic respiratory failure with hypoxia: Secondary | ICD-10-CM | POA: Diagnosis not present

## 2021-11-22 LAB — CBC
HCT: 28.1 % — ABNORMAL LOW (ref 39.0–52.0)
Hemoglobin: 7.6 g/dL — ABNORMAL LOW (ref 13.0–17.0)
MCH: 23 pg — ABNORMAL LOW (ref 26.0–34.0)
MCHC: 27 g/dL — ABNORMAL LOW (ref 30.0–36.0)
MCV: 84.9 fL (ref 80.0–100.0)
Platelets: UNDETERMINED 10*3/uL (ref 150–400)
RBC: 3.31 MIL/uL — ABNORMAL LOW (ref 4.22–5.81)
RDW: 17.8 % — ABNORMAL HIGH (ref 11.5–15.5)
WBC: 10.8 10*3/uL — ABNORMAL HIGH (ref 4.0–10.5)
nRBC: 0.9 % — ABNORMAL HIGH (ref 0.0–0.2)

## 2021-11-22 LAB — BASIC METABOLIC PANEL
Anion gap: 3 — ABNORMAL LOW (ref 5–15)
BUN: 18 mg/dL (ref 8–23)
CO2: 31 mmol/L (ref 22–32)
Calcium: 8.3 mg/dL — ABNORMAL LOW (ref 8.9–10.3)
Chloride: 107 mmol/L (ref 98–111)
Creatinine, Ser: 1.05 mg/dL (ref 0.61–1.24)
GFR, Estimated: >60 mL/min (ref >60)
Glucose, Bld: 107 mg/dL — ABNORMAL HIGH (ref 70–99)
Potassium: 4.4 mmol/L (ref 3.5–5.1)
Sodium: 141 mmol/L (ref 135–145)

## 2021-11-22 LAB — RETICULOCYTES
Immature Retic Fract: 36 % — ABNORMAL HIGH (ref 2.3–15.9)
RBC.: 3.33 MIL/uL — ABNORMAL LOW (ref 4.22–5.81)
Retic Count, Absolute: 76.3 10*3/uL (ref 19.0–186.0)
Retic Ct Pct: 2.3 % (ref 0.4–3.1)

## 2021-11-22 LAB — IRON AND TIBC
Iron: 19 ug/dL — ABNORMAL LOW (ref 45–182)
Saturation Ratios: 6 % — ABNORMAL LOW (ref 17.9–39.5)
TIBC: 333 ug/dL (ref 250–450)
UIBC: 314 ug/dL

## 2021-11-22 LAB — FOLATE: Folate: 11.9 ng/mL (ref >5.9)

## 2021-11-22 LAB — VITAMIN B12: Vitamin B-12: 422 pg/mL (ref 180–914)

## 2021-11-22 LAB — MAGNESIUM: Magnesium: 2.1 mg/dL (ref 1.7–2.4)

## 2021-11-22 LAB — PHOSPHORUS: Phosphorus: 3.2 mg/dL (ref 2.5–4.6)

## 2021-11-22 LAB — FERRITIN: Ferritin: 17 ng/mL — ABNORMAL LOW (ref 24–336)

## 2021-11-22 MED ORDER — SODIUM CHLORIDE 0.9 % IV SOLN
250.0000 mg | Freq: Every day | INTRAVENOUS | Status: AC
  Administered 2021-11-22 – 2021-11-23 (×2): 250 mg via INTRAVENOUS
  Filled 2021-11-22 (×2): qty 20

## 2021-11-22 MED ORDER — ACETAMINOPHEN 325 MG PO TABS
650.0000 mg | ORAL_TABLET | Freq: Four times a day (QID) | ORAL | Status: DC | PRN
  Administered 2021-11-22 – 2021-12-12 (×5): 650 mg via ORAL
  Filled 2021-11-22 (×5): qty 2

## 2021-11-22 MED ORDER — MELATONIN 5 MG PO TABS
5.0000 mg | ORAL_TABLET | Freq: Every day | ORAL | Status: DC
  Administered 2021-11-22 – 2021-12-12 (×20): 5 mg via ORAL
  Filled 2021-11-22 (×21): qty 1

## 2021-11-22 NOTE — Progress Notes (Signed)
Occupational Therapy Re-Evaluation Patient Details Name: Gary [name] MRN: [id number] DOB: January 29, 1956 Today's Date: 11/22/2021   History of Present Illness 66 y/o male admitted mid June secondary to SOB, COPD exacerbation, acute metabolic encephalopathy, acute on chronic hypoxic and hypercapnic respiratory failure in setting of acute decompensated HFpEF and AECOPD requiring intubation and mechanical ventilation. The pt is now s/p tracheostomy and PEG tube placement. New medical complications including GI bleed and multiple patient refusuals.   Clinical Impression   Chart reviewed, re-evaluation completed on this date. Pt is agreeable to OT/PT co tx following encouragement. Goals have been downgraded as pt has had multiple medical issues and pt appears to be self limiting towards progress. MOD A +2 required for supine<>sit, STS with MOD A +2 with pt unable to achieve full upright standing on 3 attempts. Pt requires MAX A for LB dressing (socks), feeding with SET UP at edge of bed, toileting with supervision via use of urinal bed level. Pt continues to perform ADL/functional mobility below PLOF, would benefit from STR to address deficits. Pt is left as received, NAD, all needs met, in care of PT. OT will follow acutely.      Recommendations for follow up therapy are one component of a multi-disciplinary discharge planning process, led by the attending physician.  Recommendations may be updated based on patient status, additional functional criteria and insurance authorization.   Follow Up Recommendations  Skilled nursing-short term rehab (<3 hours/day)    Assistance Recommended at Discharge Frequent or constant Supervision/Assistance  Patient can return home with the following Two people to help with walking and/or transfers;Assistance with cooking/housework;Direct supervision/assist for medications management;Assist for transportation;Two people to help with bathing/dressing/bathroom     Functional Status Assessment     Equipment Recommendations  Other (comment) (per next venue of care)    Recommendations for Other Services       Precautions / Restrictions Precautions Precautions: Fall Precaution Comments: IJ, G tube, trach Restrictions Weight Bearing Restrictions: No Other Position/Activity Restrictions: Paring of blood blister on LLE 11/03/21; Per Dr Luana Shu no weight bearing restrictions on LLE      Mobility Bed Mobility Overal bed mobility: Needs Assistance Bed Mobility: Supine to Sit, Sit to Supine     Supine to sit: Mod assist, +2 for physical assistance, HOB elevated Sit to supine: Max assist, +2 for physical assistance        Transfers Overall transfer level: Needs assistance Equipment used: Rolling walker (2 wheels) Transfers: Sit to/from Stand Sit to Stand: Mod assist, +2 physical assistance, From elevated surface (significantly elevated)          Lateral/Scoot Transfers: From elevated surface General transfer comment: 3 STS with Bari RW with b feet and knee blocked. Unable to achieve full upright standing.      Balance Overall balance assessment: Needs assistance Sitting-balance support: Feet supported, No upper extremity supported Sitting balance-[name] Scale: Good Sitting balance - Comments: seated on edge of bed for approx 30 minutes with good dynamic standing balance   Standing balance support: Bilateral upper extremity supported, During functional activity, Reliant on assistive device for balance Standing balance-[name] Scale: Poor                             ADL either performed or assessed with clinical judgement   ADL Overall ADL's : Needs assistance/impaired Eating/Feeding: Set up;Sitting Eating/Feeding Details (indicate cue type and reason): on edge of bed Grooming: Wash/dry hands;Sitting;Set up  Lower Body Dressing: Maximal assistance Lower Body Dressing Details (indicate cue type and  reason): socks     Toileting- Clothing Manipulation and Hygiene: Supervision/safety Toileting - Clothing Manipulation Details (indicate cue type and reason): for urinating bed level with urinal             Vision         Perception     Praxis      Pertinent Vitals/Pain Pain Assessment Pain Assessment: Faces Faces Pain Scale: Hurts even more Pain Location: L hand/feet are tender. also becomes SOB with exertion Pain Descriptors / Indicators: Grimacing, Aching, Guarding Pain Intervention(s): Limited activity within patient's tolerance, Monitored during session, RN gave pain meds during session, Repositioned     Hand Dominance     Extremity/Trunk Assessment Upper Extremity Assessment Upper Extremity Assessment: Defer to OT evaluation;Generalized weakness;LUE deficits/detail LUE Deficits / Details: LUE to approx 90* with AROM pt reports this is baseline, elbow/wrist WFL   Lower Extremity Assessment Lower Extremity Assessment: Generalized weakness       Communication Communication Communication: Tracheostomy;Passy-Muir valve   Cognition Arousal/Alertness: Awake/alert Behavior During Therapy: WFL for tasks assessed/performed, Anxious Overall Cognitive Status: No family/caregiver present to determine baseline cognitive functioning Area of Impairment: Following commands, Safety/judgement, Awareness, Problem solving                       Following Commands: Follows one step commands consistently Safety/Judgement: Decreased awareness of safety, Decreased awareness of deficits Awareness: Intellectual Problem Solving: Slow processing, Requires verbal cues, Requires tactile cues       General Comments       Exercises     Shoulder Instructions      Home Living Family/patient expects to be discharged to:: Private residence Living Arrangements: Spouse/significant other Available Help at Discharge: Family Type of Home: Apartment Home Access: Level entry      Home Layout: One level     Bathroom Shower/Tub: Teacher, early years/pre: Handicapped height     Home Equipment: Radio producer - single point   Additional Comments: Information obtained from pt chart      Prior Functioning/Environment Prior Level of Function : Needs assist             Mobility Comments: Pt reports he's ambulatory with SPC          OT Problem List:        OT Treatment/Interventions:      OT Goals(Current goals can be found in the care plan section) Acute Rehab OT Goals Patient Stated Goal: to get stronger OT Goal Formulation: With patient Time For Goal Achievement: 12/06/21 ADL Goals Pt Will Perform Grooming: with supervision;sitting Pt Will Transfer to Toilet: with max assist;with +2 assist;stand pivot transfer;bedside commode  OT Frequency: Min 2X/week    Co-evaluation PT/OT/SLP Co-Evaluation/Treatment: Yes Reason for Co-Treatment: Complexity of the patient's impairments (multi-system involvement);Necessary to address cognition/behavior during functional activity;For patient/therapist safety;To address functional/ADL transfers PT goals addressed during session: Mobility/safety with mobility;Balance;Strengthening/ROM OT goals addressed during session: ADL's and self-care      AM-PAC OT "6 Clicks" Daily Activity     Outcome Measure Help from another person eating meals?: None Help from another person taking care of personal grooming?: A Little Help from another person toileting, which includes using toliet, bedpan, or urinal?: A Lot Help from another person bathing (including washing, rinsing, drying)?: A Lot Help from another person to put on and taking off regular upper body clothing?: A Little Help  from another person to put on and taking off regular lower body clothing?: A Lot 6 Click Score: 16   End of Session Equipment Utilized During Treatment: Oxygen Nurse Communication: Mobility status  Activity Tolerance: Patient tolerated  treatment well Patient left: in bed;with call bell/phone within reach (in care of PT)  OT Visit Diagnosis: Other abnormalities of gait and mobility (R26.89);Muscle weakness (generalized) (M62.81)                Time: 2019-9241 OT Time Calculation (min): 53 min Charges:  OT General Charges $OT Visit: 1 Visit OT Evaluation $OT Re-eval: 1 Re-eval OT Treatments $Self Care/Home Management : 8-22 mins $Therapeutic Activity: 8-22 mins  Shanon Payor, OTD OTR/L  11/22/21, 4:00 PM

## 2021-11-22 NOTE — Progress Notes (Signed)
PROGRESS NOTE    Gary [name]  IEP:[id number] DOB: [birth year] DOA: 09/19/2021 PCP: [facility] [place]   Brief Narrative: This 66 y/o male with multiple medical problems causing chronic hypercapneic respiratory failure who has had admissions in the past for exacerbations of the same and mechanical ventilation presented for evaluation of dyspnea, weakness. It is unclear if the patient has been compliant with his medications and NIMV at home.  He was admitted by the hospitalists for a presumed COPD exacerbation in the ER, treated with bronchodilators and NIMV but his mental status and hypercarbia worsened despite those interventions so he required intubation.   6/6: Presented to ED.  Required intubation and mechanical ventilation in the ED.  PCCM admitted in ICU 6/7: Hold diuresis due to worsening Creatinine.  ENT consulted for Trach placement per family request 6/8: Pt continues to spike temps tmax 101.8 degrees F.  Flexiseal placed overnight draining foul smelling watery stool~Cdiff and GI panel negative 6/9: Fever resolved.  Negative for C- diff, GI panel still pending.  Creatinine slightly worsened, requiring low dose levophed (2 mcg). Holding diuresis 6/10: propofol discontinued due hypertriglyceridemia; failed SBT 6/11: Tracheal aspirate from 6/6 resulting with Pseudomonas, ABX changed to Cefepime. neurologically intact, awaiting Trach placement 6/12: Pt calmer today following addition of oral benzo's, narcotics, and Seroquel via tube.  Creatinine worsened with decreased UOP, holding diuresis, low threshold for Nephrology consult  6/13: Pt with worsening renal failure creatinine 4.15.  UOP overnight 25 ml.  Nephrology consulted plans to start pt on hemodialysis follow dialysis catheter placement Pt underwent hemodialysis without ultrafiltration 06/14: .  Plans for HD session today.  Agitation has improved significantly with narcotics and antianxiety medications per tube   06/16:  Tracheostomy canceled 06/15 due to hyperkalemia and tentatively rescheduled for 06/20  6/18 failure to wean from vent 06/20: ENT placed Tracheostomy (#7.0 XLT cuffed).  Developed mild epistaxis post unsuccessful attempt at NGT placement.  Later developed hematochezia/melena, Protonix gtt started and GI consulted. 06/21: Diuresed yesterday with limited results. Patient had softed blood pressures throughout the day and diuresis was held as a result. HD was performed by the patient clotted off the machine and was not able to be rinsed back. Repeat hemoglobin is above 8. 06/23: Pt remains mechanically intubated settings: FiO2 50%/PEEP 10.  CXR concerning for moderate right pleural effusion with associated right basilar atelectasis vs. infiltrate.  Will perform [place] Chest to determine if pt needs right sided thoracentesis  06/24: Remains on the ventilator, FiO2 requirements decreasing, respiratory culture with abundant gram-negative rods: [name], on Meropenem 06/25: Oxygen requirements down to 40%, chest x-ray shows improvement on dense right consolidation, Pseudomonas susceptibilities pending 06/25: Pt remains on the ventilator settings: PEEP 10 and FiO2 40%; will attempt to wean PEEP to 5 to perform SBT  06/28: Pt tolerated PS on 06/27.  Overnight pt had episodes of vomiting TF's suspect secondary to malposition of nasogastric tube.  Will consult IR for PEG tube placement if unable to place PEG tube pt will need postpyloric Dobbhoff   06/29: Pt tolerated PS 5/5 for over an hour yesterday, however due to fatigue placed back on PRVC.  Pt pulled NGT out several times overnight, therefore will not attempt to reinsert.  Plans for PEG tube placement per IR on 06/30.   06/30: PEG tube placed per IR  07/1: Pt tolerated trach collar _0 % throughout the day 07/2: Pt placed on ventilator overnight settings 16/5/35% tolerating well.  Will place pt on trach  collar today and place orders for PT/OT 7/3:  Hemodynamically stable.  Tolerating TC (has remained on TC since 7/2 @ 08:00), plan for speech therapy and PT/OT 7/4: Has maintained on trach collar for greater than 48 hours, tolerating Passy-Muir valve.  Plan to transfer out of ICU. SLP recs: dysphagia 1 (puree) w/ nectar liquid.  7/5-7/6: sodium and creatinine trending appropriately, Hgb stable. Low K repleted and stable. Was off vent qhs (orders d/c or missed?) and was restarted. Will need LTAC, TOC following  7/7: pulled out trach overnight. Confusion re: vent plan among staff and myself. I asked PCCM to reevluate him and verify plan so the team and the patient can eb on the same page, and will help TOC arrange appropriate placement.  7/8: I spoke personally w/ Dr Milon Dikes [facility]) and he confirms patient does NOT need ventilator support, unless of course develops distress, but last night the patient tolerated well without it. Plan to meet w/ family today when they visit, I spoke w/ his sister yesterday on the phone. No concerns on CBC/BMP: Cr tending down a bit, Hgb stable, Na slightly high but improving over past few days. He is approaching medically stable and may be appropriate for LTAC vs SNF soon  7/9: Several long discussions with patient and family about medical condition, behavioral issues.  See separate progress notes.  Psychiatry evaluated, agree that he has capacity, started SSRI. 7/10: Patient remains medically stable, awaiting placement.  Discussed his behavioral issues again and encouraged him to always bring any concerns to nursing staff or to me calmly and I am happy to listen, requested he maintain professionalism towards staff, he seems amenable. 7/11-7/14: goal OOB to chair, needing hoyer lift, pt reeducated several times about staying in bed 07/11 and has been compliant through 07/14. Finally got bariatric chair and hoyer lift 7/12 and was OOB 07/13.  D/c monitoring and sitter and transferred to [place] status 07/13. Awaiting placement.   7/15-17- working to reorient patient, but personality and coherence remain difficult. Stable for placement. Reminded patient not to verbally abuse staff 7/28 - rising O2 needs past couple of days, due to mucus plugging.  ABG stable, normal mentation.  Improved after suctioning. 8/2: Patient awaiting placement in a long-term acute care setting.. 8/3: Patient had multiple episodes of maroon-colored big bowel movements through out night.  H&H remains stable, vitals stable but patient at high risk for decompensation.  Patient transferred to ICU.  2 unit PRBC given, GI Dr. Lynnell Jude notified,  Nuclear scan positive for active GI bleeding in the proximal colon, vascular surgery Dr. Delana Meyer and ICU Dr. Lanney Gins notified.  Patient underwent embolization of the bleeding artery. 8/4: Patient continues to have bright red blood per rectum.  Patient underwent flex sigmoidoscopy showed blood in the rectum, rectosigmoid colon, diverticulosis.  Hemoglobin dropped to 7.6.  Transfused 2 unit PRBC.  If patient continues to bleed may require general surgery evaluation. 8/5-8/8: Patient remains hemodynamically stable, Patient reports very little blood in the stools.  1 unit PRBC given, Hb 7.9. 8/9: Patient remained stable, no more bleeding per rectum.  Received total of 3 unit of PRBC.  Hemoglobin at 7.6, anemia panel with concern of iron deficiency.  Iron replacement ordered.  Assessment & Plan:   Principal Problem:   Acute on chronic respiratory failure with hypoxia and hypercapnia ([name]) Active Problems:   Major depressive disorder, recurrent episode, moderate ([name])   Bloody stool   COPD with acute exacerbation ([name])   Acute metabolic encephalopathy  HTN (hypertension)   Acute on chronic diastolic congestive heart failure ([name])   Obesity, Class III, BMI 40-49.9 (morbid obesity) ([place])   Obstructive sleep apnea   Chronic kidney disease, stage 3a ([name])   HLD (hyperlipidemia)   Pressure injury of skin    Hematochezia  Acute GI bleed /acute blood loss anemia: Improved. Nuclear scan positive for active GI bleeding in the proximal colon. Vascular surgery Dr. Delana Meyer has attempted embolization of the bleeding artery. Dr. Allen [name] with GI attempted flex sigmoidoscopy which shows blood in the sigmoid colon, rectosigmoid, diverticulum.  Received total of 3 unit of PRBC Continue Protonix 40 mg IV every 12 hours daily. Anemia panel with iron deficiency. -Give him IV iron supplement -Monitor hemoglobin -Transfuse if below 7 -Consult general surgery if continue to bleed   Left foot blood blister - appreciate podiatry evaluation; He underwent removal of blister at bedside by podiatry on 7/21 -  Use prevalon boots to aid with pressure wounds going forward.   Acute on Chronic Hypoxic Hypercapnic Respiratory Failure Acute exacerbation COPD Pseudomonal PNA OSA/OHS - S/p  tracheostomy by ENT by 6/20. - Continue Trach collar w/ ventilator support as needed / qhs but he does NOT need vent support routinely per conversation w/ Dr Milon Dikes 07/08 - Completed 10 day course of ceftazidime for treatment of Pseudomonas. - At this point, he is medically stable but placement is going to be an issue for him.   -  His behavioral issues have been discussed with him and his behavior has improved  - Monitor mental status closely and check blood gas PRN - Has OHS and was reliant on nightly bipap prior to trach 7/29-8/2: stable on 10 L/min, no reports of significant de-sat episodes.   Bilateral hand and foot pain -  He has hx of gout, treated for a few days with colchicine last week.   Now having diffuse joint pains in hands and feet.   Pt and family  reports hx. of rheumatoid arthritis.   Uric acid level was normal last week ESR 48, CRP 4.2 - nonspecific Rheumatoid factor elevated 58.8 --Monitor for improvement   Acute decompensated HFpEF Hypertension - Echocardiogram 08/07/2021: LVEF 60 to 09%, grade 1 diastolic  dysfunction, normal RV systolic function - Was on intermittent hemodialysis for volume removal. - has not needed HD and has remained compensated. Cancelled permacath placement  - resumed home torsemide 7/27   AKI superimposed CKD stage IIIa > Resolved Hypokalemia > Resolved. Hypernatremia > Resolved. 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 Diff negative.   Acute Blood Loss Anemia - resolved  Nasopharyngeal trauma vs Upper GI bleed - Continue BID IV PPI - per GI, no need for scope    Hyperglycemia - CBG's achs; patient refusing at times - SSI  Acute Metabolic Encephalopathy in setting of CO2 Narcosis Sedation needs in setting of mechanical ventilation - CT Head 09/19/21: negative;  UDS negative - Treatment of hypercapnia as outlined above   Trach in place Gtube in place - No longer requiring vent support - PO intake has improved, SLP and dietary following  -Continue trach collar supplemental O2 to maintain sats >88% - Continues to eat well; G-tube will have to remain in place at least 3 months prior to removal    Morbid obesity: Estimated body mass index is 52.5 kg/m as calculated from the following:   Height as of this encounter: 6'  3" (1.905 m).   Weight as of this encounter: 190.5 kg.  This  complicate the hospital course. Diet and exercise discussed in detail.   DVT prophylaxis: SCDs Code Status: Full code. Family Communication: (No family at bed side) Disposition Plan:   Status is: Inpatient Remains inpatient appropriate because: Awaiting SNF placement. Barriers; remains with behavioral issues. Ongoing chronic GI bleeding    Consultants:  PCCU Nephrology ENT GI IR Psychiatry  Procedures:  10/03/21: tracheostomy placement 10/12/21: dialysis permacath placement  10/13/21: G-tube placed    Antimicrobials:  Anti-infectives (From admission,  onward)    Start     Dose/Rate Route Frequency Ordered Stop   11/16/21 1651  ceFAZolin (ANCEF) IVPB 1 g/50 mL premix        over 30 Minutes  Continuous PRN 11/16/21 1651 11/16/21 1837   11/16/21 1559  ceFAZolin (ANCEF) IVPB 2g/100 mL premix        2 g 200 mL/hr over 30 Minutes Intravenous 30 min pre-op 11/16/21 1559 11/16/21 1700   10/12/21 1200  cefTAZidime (FORTAZ) 2 g in sodium chloride 0.9 % 100 mL IVPB        2 g 200 mL/hr over 30 Minutes Intravenous 2 times daily 10/12/21 1122 10/17/21 0844   10/12/21 0417  ceFAZolin (ANCEF) IVPB 1 g/50 mL premix  Status:  Discontinued        1 g 100 mL/hr over 30 Minutes Intravenous 30 min pre-op 10/12/21 0417 10/16/21 0751   10/09/21 2000  ceFEPIme (MAXIPIME) 1 g in sodium chloride 0.9 % 100 mL IVPB  Status:  Discontinued        1 g 200 mL/hr over 30 Minutes Intravenous Every 24 hours 10/09/21 1404 10/09/21 1515   10/09/21 2000  cefTAZidime (FORTAZ) 1 g in sodium chloride 0.9 % 100 mL IVPB  Status:  Discontinued        1 g 200 mL/hr over 30 Minutes Intravenous Every 24 hours 10/09/21 1515 10/12/21 1122   10/06/21 1830  meropenem (MERREM) 500 mg in sodium chloride 0.9 % 100 mL IVPB  Status:  Discontinued        500 mg 200 mL/hr over 30 Minutes Intravenous Every 24 hours 10/06/21 1735 10/09/21 1403   10/06/21 1800  doxycycline (VIBRAMYCIN) 100 mg in sodium chloride 0.9 % 250 mL IVPB  Status:  Discontinued        100 mg 125 mL/hr over 120 Minutes Intravenous Every 12 hours 10/06/21 1657 10/08/21 0838   09/29/21 1000  ceFEPIme (MAXIPIME) 2 g in sodium chloride 0.9 % 100 mL IVPB        2 g 200 mL/hr over 30 Minutes Intravenous Every 12 hours 09/29/21 0812 09/30/21 2342   09/26/21 2200  ceFEPIme (MAXIPIME) 1 g in sodium chloride 0.9 % 100 mL IVPB  Status:  Discontinued        1 g 200 mL/hr over 30 Minutes Intravenous Every 24 hours 09/26/21 1154 09/29/21 0812   09/26/21 2000  ceFEPIme (MAXIPIME) 2 g in sodium chloride 0.9 % 100 mL IVPB  Status:   Discontinued        2 g 200 mL/hr over 30 Minutes Intravenous Every 12 hours 09/26/21 0809 09/26/21 1154   09/24/21 1200  ceFEPIme (MAXIPIME) 2 g in sodium chloride 0.9 % 100 mL IVPB  Status:  Discontinued        2 g 200 mL/hr over 30 Minutes Intravenous Every 8 hours 09/24/21 0950 09/26/21 0809   09/21/21 1430  azithromycin (ZITHROMAX) 500  mg in sodium chloride 0.9 % 250 mL IVPB        500 mg 250 mL/hr over 60 Minutes Intravenous Every 24 hours 09/21/21 1344 09/23/21 1543   09/21/21 1430  cefTRIAXone (ROCEPHIN) 2 g in sodium chloride 0.9 % 100 mL IVPB  Status:  Discontinued        2 g 200 mL/hr over 30 Minutes Intravenous Every 24 hours 09/21/21 1344 09/24/21 0922        Subjective: Patient was resting comfortably when seen today.  Denies any more bleeding.  No other complaints.  Objective: Vitals:   11/22/21 0741 11/22/21 0809 11/22/21 1141 11/22/21 1632  BP:  113/79    Pulse:  81 79 77  Resp:  $Remo'18 18 18  'tYGLK$ Temp:  98.4 F (36.9 C)    TempSrc:      SpO2: 90% 92% 93% 94%  Weight:      Height:        Intake/Output Summary (Last 24 hours) at 11/22/2021 1734 Last data filed at 11/22/2021 1715 Gross per 24 hour  Intake 480 ml  Output 1825 ml  Net -1345 ml     Filed Weights   11/20/21 0528 11/21/21 0502 11/22/21 0500  Weight: (!) 189.1 kg (!) 187.3 kg (!) 190.5 kg    Examination:  General.  Morbidly obese gentleman, in no acute distress. Pulmonary.  Lungs clear bilaterally, normal respiratory effort. CV.  Regular rate and rhythm, no JVD, rub or murmur. Abdomen.  Soft, nontender, nondistended, BS positive. CNS.  Alert and oriented .  No focal neurologic deficit. Extremities.  No edema, no cyanosis, pulses intact and symmetrical. Psychiatry.  Judgment and insight appears normal.   Data Reviewed: I have personally reviewed following labs and imaging studies  CBC: Recent Labs  Lab 11/17/21 0629 11/17/21 1151 11/17/21 2102 11/19/21 0646 11/20/21 1233 11/22/21 0554   WBC 11.8*  --   --  10.4  --  10.8*  NEUTROABS  --   --   --  7.4  --   --   HGB 8.2* 7.6* 7.9* 7.3* 7.9* 7.6*  HCT 29.5* 27.7* 27.6* 26.3* 27.7* 28.1*  MCV 80.2  --   --  80.9  --  84.9  PLT 202  --   --  PLATELET CLUMPS NOTED ON SMEAR, UNABLE TO ESTIMATE  --  PLATELET CLUMPS NOTED ON SMEAR, UNABLE TO ESTIMATE    Basic Metabolic Panel: Recent Labs  Lab 11/17/21 0629 11/18/21 0511 11/19/21 0646 11/22/21 0554  NA 138 139 136 141  K 4.8 4.2 4.2 4.4  CL 100 99 100 107  CO2 32 32 28 31  GLUCOSE 113* 109* 123* 107*  BUN 35* 34* 25* 18  CREATININE 1.42* 1.35* 1.26* 1.05  CALCIUM 8.2* 8.3* 8.0* 8.3*  MG  --  2.1 2.1 2.1  PHOS  --  3.7 3.6 3.2    GFR: Estimated Creatinine Clearance: 127.6 mL/min (by C-G formula based on SCr of 1.05 mg/dL). Liver Function Tests: Recent Labs  Lab 11/18/21 0511  AST 9*  ALT 10  ALKPHOS 57  BILITOT 0.6  PROT 6.3*  ALBUMIN 2.7*    No results for input(s): "LIPASE", "AMYLASE" in the last 168 hours. No results for input(s): "AMMONIA" in the last 168 hours. Coagulation Profile: Recent Labs  Lab 11/16/21 1132  INR 1.0    Cardiac Enzymes: No results for input(s): "CKTOTAL", "CKMB", "CKMBINDEX", "TROPONINI" in the last 168 hours. BNP (last 3 results) No results for input(s): "PROBNP" in  the last 8760 hours. HbA1C: No results for input(s): "HGBA1C" in the last 72 hours. CBG: No results for input(s): "GLUCAP" in the last 168 hours.  Lipid Profile: No results for input(s): "CHOL", "HDL", "LDLCALC", "[place]", "CHOLHDL", "LDLDIRECT" in the last 72 hours. Thyroid Function Tests: No results for input(s): "TSH", "T4TOTAL", "FREET4", "T3FREE", "THYROIDAB" in the last 72 hours. Anemia Panel: Recent Labs    11/22/21 0554 11/22/21 1012  VITAMINB12  --  422  FOLATE 11.9  --   FERRITIN 17*  --   TIBC 333  --   IRON 19*  --   RETICCTPCT 2.3  --    Sepsis Labs: No results for input(s): "PROCALCITON", "LATICACIDVEN" in the last 168  hours.  No results found for this or any previous visit (from the past 240 hour(s)).   Radiology Studies: No results found.  Scheduled Meds:  (feeding supplement) PROSource Plus  30 mL Oral TID PC & HS   arformoterol  15 mcg Nebulization BID   vitamin C  500 mg Oral BID   atorvastatin  10 mg Oral Daily   Chlorhexidine Gluconate Cloth  6 each Topical Daily   cholecalciferol  2,000 Units Oral Daily   escitalopram  5 mg Oral Daily   free water  30 mL Per Tube Q4H   gabapentin  300 mg Oral Q8H   lidocaine  1 patch Transdermal Daily   mouth rinse  15 mL Mouth Rinse 4 times per day   pantoprazole (PROTONIX) IV  40 mg Intravenous Q12H   sodium chloride flush  3 mL Intravenous Q12H   Continuous Infusions:  sodium chloride Stopped (10/07/21 0542)   sodium chloride       LOS: 64 days    Time spent: 38 Mins  Time spent 35 minutes in management and coordination of care.  This record has been created using Systems analyst. Errors have been sought and corrected,but may not always be located. Such creation errors do not reflect on the standard of care.     Lorella Nimrod, MD [facility]   If 7PM-7AM, please contact night-coverage

## 2021-11-22 NOTE — Evaluation (Signed)
Physical Therapy RE-Evaluation Patient Details Name: Gary [name] MRN: [id number] DOB: 28-Feb-1956 Today's Date: 11/22/2021  History of Present Illness  66 y/o male admitted mid June secondary to SOB, COPD exacerbation, acute metabolic encephalopathy, acute on chronic hypoxic and hypercapnic respiratory failure in setting of acute decompensated HFpEF and AECOPD requiring intubation and mechanical ventilation. The pt is now s/p tracheostomy and PEG tube placement. New medical complications including GI bleed and multiple patient refusuals, now s/p PT re-evaluation this date. Co-eval performed with OT  Clinical Impression  Pt is an intermittently pleasant 66 year old male who was admitted for acute on chronic respiratory failure. Pt now with trach and 5L of O2 and PEG placement. First attempt for evaluation this morning, pt just finished with RN care and requested to return at 1:00. Upon arrival at scheduled time, pt intermittently agitated that he hasn't ate his lunch and that therapist thinks we are more important than food. Explained this was not intentional and this was a previously scheduled session. Pt very reluctant to participate, however after extended time for service recovery including education and goal setting, pt agreeable to attempt transfers. Pt performs bed mobility with mod/max assist +2, and transfers with mod assist +2 and RW. Unable to tolerate ambulation trial this date and set up with food tray at end of session. Pt demonstrates deficits with strength/mobility/endurance. Pt reports he hopes he can go home and wishes to do more with therapy staff. Encouraged to participate with every opportunity. Still +2, coordinate with assistance if possible. Would benefit from skilled PT to address above deficits and promote optimal return to PLOF; recommend transition to STR upon discharge from acute hospitalization.      Recommendations for follow up therapy are one component of a  multi-disciplinary discharge planning process, led by the attending physician.  Recommendations may be updated based on patient status, additional functional criteria and insurance authorization.  Follow Up Recommendations Skilled nursing-short term rehab (<3 hours/day) Can patient physically be transported by private vehicle: No    Assistance Recommended at Discharge Frequent or constant Supervision/Assistance  Patient can return home with the following  Two people to help with walking and/or transfers;Two people to help with bathing/dressing/bathroom;Assistance with cooking/housework;Assist for transportation;Help with stairs or ramp for entrance    Equipment Recommendations  (TBD)  Recommendations for Other Services       Functional Status Assessment Patient has had a recent decline in their functional status and/or demonstrates limited ability to make significant improvements in function in a reasonable and predictable amount of time     Precautions / Restrictions Precautions Precautions: Fall Precaution Comments: IJ, G tube, trach Restrictions Weight Bearing Restrictions: No      Mobility  Bed Mobility Overal bed mobility: Needs Assistance Bed Mobility: Supine to Sit, Sit to Supine     Supine to sit: HOB elevated, +2 for physical assistance, Mod assist Sit to supine: Max assist, +2 for physical assistance   General bed mobility comments: pt prefers to use bed controls to ease effort for mobility. Encouraged to trend bed to allow ease of mobility. Able to use B UE for assistance, however requires physical assistance for B LEs. Once seated at EOB, upright posture noted and increased WOB. ALl mobility performed on 5L of O2. Able to sit for prolonged session.    Transfers Overall transfer level: Needs assistance Equipment used: Rolling walker (2 wheels) Transfers: Sit to/from Stand Sit to Stand: Mod assist, +2 physical assistance, From elevated surface  General transfer comment: able to attempt 3 sit<>Stand transfers using Bari RW. Requires B foot block and knee block due to B LE weakness. Pt tends to brace against bed during standing and only tolerates a few brief seconds. COntrolled decent back to bed.    Ambulation/Gait               General Gait Details: not able  Stairs            Wheelchair Mobility    Modified Rankin (Stroke Patients Only)       Balance Overall balance assessment: Needs assistance Sitting-balance support: Feet supported, No upper extremity supported Sitting balance-[name] Scale: Good     Standing balance support: Bilateral upper extremity supported, During functional activity, Reliant on assistive device for balance Standing balance-[name] Scale: Poor                               Pertinent Vitals/Pain Pain Assessment Pain Assessment: Faces Faces Pain Scale: Hurts even more Pain Location: L hand/feet are tender. also becomes SOB with exertion Pain Descriptors / Indicators: Grimacing, Aching, Guarding Pain Intervention(s): Limited activity within patient's tolerance, Repositioned, RN gave pain meds during session    [place] expects to be discharged to:: Private residence Living Arrangements: Spouse/significant other Available Help at Discharge: Family Type of Home: Apartment Home Access: Level entry       Home Layout: One level Home Equipment: Cane - single point Additional Comments: Information obtained from pt chart    Prior Function Prior Level of Function : Needs assist             Mobility Comments: Pt reports he's ambulatory with SPC       Hand Dominance        Extremity/Trunk Assessment   Upper Extremity Assessment Upper Extremity Assessment: Defer to OT evaluation    Lower Extremity Assessment Lower Extremity Assessment: Generalized weakness (B LE grossly 3/5; limited AROM at all joints due to obesity)        Communication   Communication: Tracheostomy;Passy-Muir valve  Cognition Arousal/Alertness: Awake/alert Behavior During Therapy: WFL for tasks assessed/performed, Anxious Overall Cognitive Status: Within Functional Limits for tasks assessed                                 General Comments: Pt pleasant majority of time, however did have 1 instance of agitation when food tray was moved away, taking increased time for service recovery. At end of session, pt thanked both therapist for assistance        General Comments      Exercises Other Exercises Other Exercises: extensive education regarding role of therapy in acute hospital, mobility expectations, and pt preferences Other Exercises: Pt performed B LE LAQ and AP. Unable to perform seated marching. 10 reps with B LE   Assessment/Plan    PT Assessment Patient needs continued PT services  PT Problem List Decreased strength;Decreased mobility;Decreased activity tolerance;Cardiopulmonary status limiting activity;Decreased balance;Decreased skin integrity;Obesity       PT Treatment Interventions Gait training;Functional mobility training;Neuromuscular re-education;Balance training;Therapeutic exercise;Therapeutic activities    PT Goals (Current goals can be found in the Care Plan section)  Acute Rehab PT Goals Patient Stated Goal: get back to baseline PT Goal Formulation: With patient Time For Goal Achievement: 12/06/21 Potential to Achieve Goals: Fair    Frequency Min 2X/week  Co-evaluation PT/OT/SLP Co-Evaluation/Treatment: Yes Reason for Co-Treatment: Complexity of the patient's impairments (multi-system involvement);For patient/therapist safety;To address functional/ADL transfers PT goals addressed during session: Mobility/safety with mobility;Balance;Strengthening/ROM         AM-PAC PT "6 Clicks" Mobility  Outcome Measure Help needed turning from your back to your side while in a flat bed without  using bedrails?: A Lot Help needed moving from lying on your back to sitting on the side of a flat bed without using bedrails?: A Lot Help needed moving to and from a bed to a chair (including a wheelchair)?: Total Help needed standing up from a chair using your arms (e.g., wheelchair or bedside chair)?: A Lot Help needed to walk in hospital room?: Total Help needed climbing 3-5 steps with a railing? : Total 6 Click Score: 9    End of Session Equipment Utilized During Treatment: Oxygen Activity Tolerance: Patient limited by fatigue Patient left: in bed;with call bell/phone within reach Nurse Communication: Mobility status PT Visit Diagnosis: Unsteadiness on feet (R26.81);Muscle weakness (generalized) (M62.81);Difficulty in walking, not elsewhere classified (R26.2)    Time: 6761-9509 PT Time Calculation (min) (ACUTE ONLY): 49 min   Charges:   PT Evaluation $PT Re-evaluation: 1 Re-eval PT Treatments $Therapeutic Exercise: 8-22 mins $Therapeutic Activity: 8-22 mins        Greggory Stallion, PT, DPT, GCS [phone]   Keyshla Tunison 11/22/2021, 2:33 PM

## 2021-11-23 DIAGNOSIS — J9622 Acute and chronic respiratory failure with hypercapnia: Secondary | ICD-10-CM | POA: Diagnosis not present

## 2021-11-23 DIAGNOSIS — J9621 Acute and chronic respiratory failure with hypoxia: Secondary | ICD-10-CM | POA: Diagnosis not present

## 2021-11-23 LAB — CBC
HCT: 28.1 % — ABNORMAL LOW (ref 39.0–52.0)
Hemoglobin: 7.6 g/dL — ABNORMAL LOW (ref 13.0–17.0)
MCH: 23 pg — ABNORMAL LOW (ref 26.0–34.0)
MCHC: 27 g/dL — ABNORMAL LOW (ref 30.0–36.0)
MCV: 85.2 fL (ref 80.0–100.0)
Platelets: UNDETERMINED 10*3/uL (ref 150–400)
RBC: 3.3 MIL/uL — ABNORMAL LOW (ref 4.22–5.81)
RDW: 18.4 % — ABNORMAL HIGH (ref 11.5–15.5)
WBC: 10.3 10*3/uL (ref 4.0–10.5)
nRBC: 1.5 % — ABNORMAL HIGH (ref 0.0–0.2)

## 2021-11-23 LAB — BASIC METABOLIC PANEL
Anion gap: 5 (ref 5–15)
BUN: 16 mg/dL (ref 8–23)
CO2: 31 mmol/L (ref 22–32)
Calcium: 8.4 mg/dL — ABNORMAL LOW (ref 8.9–10.3)
Chloride: 105 mmol/L (ref 98–111)
Creatinine, Ser: 1.04 mg/dL (ref 0.61–1.24)
GFR, Estimated: >60 mL/min (ref >60)
Glucose, Bld: 85 mg/dL (ref 70–99)
Potassium: 4.6 mmol/L (ref 3.5–5.1)
Sodium: 141 mmol/L (ref 135–145)

## 2021-11-23 NOTE — Progress Notes (Signed)
Physical Therapy Treatment Patient Details Name: Gary [name] MRN: [id number] DOB: March 14, 1956 Today's Date: 11/23/2021   History of Present Illness 66 y/o male admitted mid June secondary to SOB, COPD exacerbation, acute metabolic encephalopathy, acute on chronic hypoxic and hypercapnic respiratory failure in setting of acute decompensated HFpEF and AECOPD requiring intubation and mechanical ventilation. The pt is now s/p tracheostomy and PEG tube placement. New medical complications including GI bleed and multiple patient refusuals.    PT Comments    Pt is not making progress towards goals. He has been very verbally abusive and picks/chooses when/who he decides to work with. Patient is well known to this Probation officer and pt requested this therapist come work with him today. On first attempt, pt sleeping and was angry that therapist woke him up to perform therapy. He said he would work with therapy at 2pm at a time more convenient for him.   On second attempt (1357), pt asleep again and angry that therapist woke patient up to perform therapy. Agreeable to limited there-ex with poor effort and agrees to attempt to stand. Once sitting at EOB with +2 assist, pt demanded bed height elevated to assist with standing. Therapist adjusted bed height while keeping patient safe, however R foot not on floor. Encouraged pt to shift weight to place B feet on floor and pt became very agitated with therapist. He refused assistance to stand and angry that therapist wasn't helping. Unable to redirect or perform service recovery despite multiple attempts. Transitioned pt to supine at end of session and pt responded by "don't ever come back in here again, you are just like the rest of them". This behavior is NOT acceptable and is inappropriate in this setting.  Pt is not consistently working with therapy to continue to recommend SNF. Downgrading to HHPT which is pt preference-coordinated with care team. Recommend continue use  of hoyer lift for mobility unless patient agreeable to attempt standing with B feet on floor. Pt may do better with male therapist in future.  Recommendations for follow up therapy are one component of a multi-disciplinary discharge planning process, led by the attending physician.  Recommendations may be updated based on patient status, additional functional criteria and insurance authorization.  Follow Up Recommendations  Home health PT Can patient physically be transported by private vehicle: No   Assistance Recommended at Discharge Frequent or constant Supervision/Assistance  Patient can return home with the following Two people to help with walking and/or transfers;Two people to help with bathing/dressing/bathroom;Assistance with cooking/housework;Assist for transportation;Help with stairs or ramp for entrance   Equipment Recommendations  None recommended by PT    Recommendations for Other Services       Precautions / Restrictions Precautions Precautions: Fall Precaution Comments: IJ, G tube, trach Restrictions Weight Bearing Restrictions: No     Mobility  Bed Mobility Overal bed mobility: Needs Assistance Bed Mobility: Supine to Sit, Sit to Supine     Supine to sit: Mod assist, +2 for physical assistance Sit to supine: Max assist, +2 for safety/equipment   General bed mobility comments: needs cues for sequencing. Pt able to utilize bed rail with physical assistance to reach with R hand. L hand extremely sensitive to touch. Needs mod assist for adjusting to upright posture. Heavy assist for B LEs. Once seated, use of chux pad to return back to supine    Transfers                   General transfer comment: pt  demanded the bed continue to be raised despite R foot hovering off floor. Explained that to perform a safe transfer, it is recommended to have B feet on floor therefore unsafe to continue to raise bed. Pt became severly agitated and refused to further attempt  mobility efforts. +2 for return back supine to keep patient safe.    Ambulation/Gait               General Gait Details: not able   Stairs             Wheelchair Mobility    Modified Rankin (Stroke Patients Only)       Balance Overall balance assessment: Needs assistance Sitting-balance support: Feet unsupported, Bilateral upper extremity supported Sitting balance-[name] Scale: Fair                                      Cognition Arousal/Alertness: Awake/alert Behavior During Therapy: WFL for tasks assessed/performed, Anxious Overall Cognitive Status: Within Functional Limits for tasks assessed                                          Exercises Other Exercises Other Exercises: supine B LE ther-ex performed x 10 reps including AP, QS, SLRs, and hip abd/add. 10 reps performed with mod/max assist for assistance. Pt lethargic during ther-ex with frequent reminders to stay awake. All mobility performed on 5L of O2 via trach with sats WNL with mobility.    General Comments        Pertinent Vitals/Pain Pain Assessment Pain Assessment: No/denies pain    Home Living                          Prior Function            PT Goals (current goals can now be found in the care plan section) Acute Rehab PT Goals Patient Stated Goal: get back to baseline PT Goal Formulation: With patient Time For Goal Achievement: 12/06/21 Potential to Achieve Goals: Fair Progress towards PT goals: Progressing toward goals    Frequency    Min 2X/week      PT Plan Discharge plan needs to be updated    Co-evaluation              AM-PAC PT "6 Clicks" Mobility   Outcome Measure  Help needed turning from your back to your side while in a flat bed without using bedrails?: A Lot Help needed moving from lying on your back to sitting on the side of a flat bed without using bedrails?: A Lot Help needed moving to and from a bed to a  chair (including a wheelchair)?: Total Help needed standing up from a chair using your arms (e.g., wheelchair or bedside chair)?: A Lot Help needed to walk in hospital room?: Total Help needed climbing 3-5 steps with a railing? : Total 6 Click Score: 9    End of Session Equipment Utilized During Treatment: Oxygen Activity Tolerance: Treatment limited secondary to agitation Patient left: in bed Nurse Communication: Mobility status PT Visit Diagnosis: Unsteadiness on feet (R26.81);Muscle weakness (generalized) (M62.81);Difficulty in walking, not elsewhere classified (R26.2)     Time: 6314-9702 PT Time Calculation (min) (ACUTE ONLY): 38 min  Charges:  $Therapeutic Exercise: 23-37 mins $Therapeutic Activity: 8-22 mins  Greggory Stallion, PT, DPT, GCS [phone]    Gary [name] 11/23/2021, 3:27 PM

## 2021-11-23 NOTE — Progress Notes (Signed)
PROGRESS NOTE    Gary [name]  IEP:[id number] DOB: [birth year] DOA: 09/19/2021 PCP: [facility] [place]   Brief Narrative: This 66 y/o male with multiple medical problems causing chronic hypercapneic respiratory failure who has had admissions in the past for exacerbations of the same and mechanical ventilation presented for evaluation of dyspnea, weakness. It is unclear if the patient has been compliant with his medications and NIMV at home.  He was admitted by the hospitalists for a presumed COPD exacerbation in the ER, treated with bronchodilators and NIMV but his mental status and hypercarbia worsened despite those interventions so he required intubation.   6/6: Presented to ED.  Required intubation and mechanical ventilation in the ED.  PCCM admitted in ICU 6/7: Hold diuresis due to worsening Creatinine.  ENT consulted for Trach placement per family request 6/8: Pt continues to spike temps tmax 101.8 degrees F.  Flexiseal placed overnight draining foul smelling watery stool~Cdiff and GI panel negative 6/9: Fever resolved.  Negative for C- diff, GI panel still pending.  Creatinine slightly worsened, requiring low dose levophed (2 mcg). Holding diuresis 6/10: propofol discontinued due hypertriglyceridemia; failed SBT 6/11: Tracheal aspirate from 6/6 resulting with Pseudomonas, ABX changed to Cefepime. neurologically intact, awaiting Trach placement 6/12: Pt calmer today following addition of oral benzo's, narcotics, and Seroquel via tube.  Creatinine worsened with decreased UOP, holding diuresis, low threshold for Nephrology consult  6/13: Pt with worsening renal failure creatinine 4.15.  UOP overnight 25 ml.  Nephrology consulted plans to start pt on hemodialysis follow dialysis catheter placement Pt underwent hemodialysis without ultrafiltration 06/14: .  Plans for HD session today.  Agitation has improved significantly with narcotics and antianxiety medications per tube   06/16:  Tracheostomy canceled 06/15 due to hyperkalemia and tentatively rescheduled for 06/20  6/18 failure to wean from vent 06/20: ENT placed Tracheostomy (#7.0 XLT cuffed).  Developed mild epistaxis post unsuccessful attempt at NGT placement.  Later developed hematochezia/melena, Protonix gtt started and GI consulted. 06/21: Diuresed yesterday with limited results. Patient had softed blood pressures throughout the day and diuresis was held as a result. HD was performed by the patient clotted off the machine and was not able to be rinsed back. Repeat hemoglobin is above 8. 06/23: Pt remains mechanically intubated settings: FiO2 50%/PEEP 10.  CXR concerning for moderate right pleural effusion with associated right basilar atelectasis vs. infiltrate.  Will perform [place] Chest to determine if pt needs right sided thoracentesis  06/24: Remains on the ventilator, FiO2 requirements decreasing, respiratory culture with abundant gram-negative rods: [name], on Meropenem 06/25: Oxygen requirements down to 40%, chest x-ray shows improvement on dense right consolidation, Pseudomonas susceptibilities pending 06/25: Pt remains on the ventilator settings: PEEP 10 and FiO2 40%; will attempt to wean PEEP to 5 to perform SBT  06/28: Pt tolerated PS on 06/27.  Overnight pt had episodes of vomiting TF's suspect secondary to malposition of nasogastric tube.  Will consult IR for PEG tube placement if unable to place PEG tube pt will need postpyloric Dobbhoff   06/29: Pt tolerated PS 5/5 for over an hour yesterday, however due to fatigue placed back on PRVC.  Pt pulled NGT out several times overnight, therefore will not attempt to reinsert.  Plans for PEG tube placement per IR on 06/30.   06/30: PEG tube placed per IR  07/1: Pt tolerated trach collar _0 % throughout the day 07/2: Pt placed on ventilator overnight settings 16/5/35% tolerating well.  Will place pt on trach  collar today and place orders for PT/OT 7/3:  Hemodynamically stable.  Tolerating TC (has remained on TC since 7/2 @ 08:00), plan for speech therapy and PT/OT 7/4: Has maintained on trach collar for greater than 48 hours, tolerating Passy-Muir valve.  Plan to transfer out of ICU. SLP recs: dysphagia 1 (puree) w/ nectar liquid.  7/5-7/6: sodium and creatinine trending appropriately, Hgb stable. Low K repleted and stable. Was off vent qhs (orders d/c or missed?) and was restarted. Will need LTAC, TOC following  7/7: pulled out trach overnight. Confusion re: vent plan among staff and myself. I asked PCCM to reevluate him and verify plan so the team and the patient can eb on the same page, and will help TOC arrange appropriate placement.  7/8: I spoke personally w/ Dr Gary [name] [facility]) and he confirms patient does NOT need ventilator support, unless of course develops distress, but last night the patient tolerated well without it. Plan to meet w/ family today when they visit, I spoke w/ his sister yesterday on the phone. No concerns on CBC/BMP: Cr tending down a bit, Hgb stable, Na slightly high but improving over past few days. He is approaching medically stable and may be appropriate for LTAC vs SNF soon  7/9: Several long discussions with patient and family about medical condition, behavioral issues.  See separate progress notes.  Psychiatry evaluated, agree that he has capacity, started SSRI. 7/10: Patient remains medically stable, awaiting placement.  Discussed his behavioral issues again and encouraged him to always bring any concerns to nursing staff or to me calmly and I am happy to listen, requested he maintain professionalism towards staff, he seems amenable. 7/11-7/14: goal OOB to chair, needing hoyer lift, pt reeducated several times about staying in bed 07/11 and has been compliant through 07/14. Finally got bariatric chair and hoyer lift 7/12 and was OOB 07/13.  D/c monitoring and sitter and transferred to [place] status 07/13. Awaiting placement.   7/15-17- working to reorient patient, but personality and coherence remain difficult. Stable for placement. Reminded patient not to verbally abuse staff 7/28 - rising O2 needs past couple of days, due to mucus plugging.  ABG stable, normal mentation.  Improved after suctioning. 8/2: Patient awaiting placement in a long-term acute care setting.. 8/3: Patient had multiple episodes of maroon-colored big bowel movements through out night.  H&H remains stable, vitals stable but patient at high risk for decompensation.  Patient transferred to ICU.  2 unit PRBC given, GI Dr. Lynnell Jude notified,  Nuclear scan positive for active GI bleeding in the proximal colon, vascular surgery Dr. Delana Meyer and ICU Dr. Lanney Gins notified.  Patient underwent embolization of the bleeding artery. 8/4: Patient continues to have bright red blood per rectum.  Patient underwent flex sigmoidoscopy showed blood in the rectum, rectosigmoid colon, diverticulosis.  Hemoglobin dropped to 7.6.  Transfused 2 unit PRBC.  If patient continues to bleed may require general surgery evaluation. 8/5-8/8: Patient remains hemodynamically stable, Patient reports very little blood in the stools.  1 unit PRBC given, Hb 7.9. 8/9: Patient remained stable, no more bleeding per rectum.  Received total of 3 unit of PRBC.  Hemoglobin at 7.6, anemia panel with concern of iron deficiency.  Iron replacement ordered. Patient remained stable.  He was asking if he can go home in the next week or so with home health services.  Per patient he is trying his best to work with PT, they recommend patient remained for SNF.  Assessment & Plan:   Principal  Problem:   Acute on chronic respiratory failure with hypoxia and hypercapnia ([name]) Active Problems:   Major depressive disorder, recurrent episode, moderate ([name])   Bloody stool   COPD with acute exacerbation ([name])   Acute metabolic encephalopathy   HTN (hypertension)   Acute on chronic diastolic congestive heart  failure ([name])   Obesity, Class III, BMI 40-49.9 (morbid obesity) ([place])   Obstructive sleep apnea   Chronic kidney disease, stage 3a ([name])   HLD (hyperlipidemia)   Pressure injury of skin   Hematochezia  Acute GI bleed /acute blood loss anemia: Improved. Nuclear scan positive for active GI bleeding in the proximal colon. Vascular surgery Dr. Delana Meyer has attempted embolization of the bleeding artery. Dr. Allen [name] with GI attempted flex sigmoidoscopy which shows blood in the sigmoid colon, rectosigmoid, diverticulum.  Received total of 3 unit of PRBC Continue Protonix 40 mg IV every 12 hours daily. Anemia panel with iron deficiency. -Give him IV iron supplement -Monitor hemoglobin -Transfuse if below 7 -Consult general surgery if continue to bleed   Left foot blood blister - appreciate podiatry evaluation; He underwent removal of blister at bedside by podiatry on 7/21 -  Use prevalon boots to aid with pressure wounds going forward.   Acute on Chronic Hypoxic Hypercapnic Respiratory Failure Acute exacerbation COPD Pseudomonal PNA OSA/OHS - S/p  tracheostomy by ENT by 6/20. - Continue Trach collar w/ ventilator support as needed / qhs but he does NOT need vent support routinely per conversation w/ Dr Gary [name] 07/08 - Completed 10 day course of ceftazidime for treatment of Pseudomonas. - At this point, he is medically stable but placement is going to be an issue for him.   -  His behavioral issues have been discussed with him and his behavior has improved  - Monitor mental status closely and check blood gas PRN - Has OHS and was reliant on nightly bipap prior to trach 7/29-8/2: stable on 10 L/min, no reports of significant de-sat episodes.   Bilateral hand and foot pain -  He has hx of gout, treated for a few days with colchicine last week.   Now having diffuse joint pains in hands and feet.   Pt and family  reports hx. of rheumatoid arthritis.   Uric acid level was normal last  week ESR 48, CRP 4.2 - nonspecific Rheumatoid factor elevated 58.8 --Monitor for improvement   Acute decompensated HFpEF Hypertension - Echocardiogram 08/07/2021: LVEF 60 to 76%, grade 1 diastolic dysfunction, normal RV systolic function - Was on intermittent hemodialysis for volume removal. - has not needed HD and has remained compensated. Cancelled permacath placement  - resumed home torsemide 7/27   AKI superimposed CKD stage IIIa > Resolved Hypokalemia > Resolved. Hypernatremia > Resolved. 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 Diff negative.   Acute Blood Loss Anemia - resolved  Nasopharyngeal trauma vs Upper GI bleed - Continue BID IV PPI - per GI, no need for scope    Hyperglycemia - CBG's achs; patient refusing at times - SSI  Acute Metabolic Encephalopathy in setting of CO2 Narcosis Sedation needs in setting of mechanical ventilation - CT Head 09/19/21: negative;  UDS negative - Treatment of hypercapnia as outlined above   Trach in place Gtube in place - No longer requiring vent support - PO intake has improved, SLP and dietary following  -Continue trach collar supplemental O2 to maintain sats >88% - Continues  to eat well; G-tube will have to remain in place at least 3 months prior to removal    Morbid obesity: Estimated body mass index is 52.5 kg/m as calculated from the following:   Height as of this encounter: $RemoveBeforeD'6\' 3"'[id number]$  (1.905 m).   Weight as of this encounter: 190.5 kg.  This  complicate the hospital course. Diet and exercise discussed in detail.   DVT prophylaxis: SCDs Code Status: Full code. Family Communication: Called sister with no response. Disposition Plan:   Status is: Inpatient Remains inpatient appropriate because: Awaiting SNF placement. Barriers; difficult disposition.  Consultants:   PCCU Nephrology ENT GI IR Psychiatry  Procedures:  10/03/21: tracheostomy placement 10/12/21: dialysis permacath placement  10/13/21: G-tube placed    Antimicrobials:  Anti-infectives (From admission, onward)    Start     Dose/Rate Route Frequency Ordered Stop   11/16/21 1651  ceFAZolin (ANCEF) IVPB 1 g/50 mL premix        over 30 Minutes  Continuous PRN 11/16/21 1651 11/16/21 1837   11/16/21 1559  ceFAZolin (ANCEF) IVPB 2g/100 mL premix        2 g 200 mL/hr over 30 Minutes Intravenous 30 min pre-op 11/16/21 1559 11/16/21 1700   10/12/21 1200  cefTAZidime (FORTAZ) 2 g in sodium chloride 0.9 % 100 mL IVPB        2 g 200 mL/hr over 30 Minutes Intravenous 2 times daily 10/12/21 1122 10/17/21 0844   10/12/21 0417  ceFAZolin (ANCEF) IVPB 1 g/50 mL premix  Status:  Discontinued        1 g 100 mL/hr over 30 Minutes Intravenous 30 min pre-op 10/12/21 0417 10/16/21 0751   10/09/21 2000  ceFEPIme (MAXIPIME) 1 g in sodium chloride 0.9 % 100 mL IVPB  Status:  Discontinued        1 g 200 mL/hr over 30 Minutes Intravenous Every 24 hours 10/09/21 1404 10/09/21 1515   10/09/21 2000  cefTAZidime (FORTAZ) 1 g in sodium chloride 0.9 % 100 mL IVPB  Status:  Discontinued        1 g 200 mL/hr over 30 Minutes Intravenous Every 24 hours 10/09/21 1515 10/12/21 1122   10/06/21 1830  meropenem (MERREM) 500 mg in sodium chloride 0.9 % 100 mL IVPB  Status:  Discontinued        500 mg 200 mL/hr over 30 Minutes Intravenous Every 24 hours 10/06/21 1735 10/09/21 1403   10/06/21 1800  doxycycline (VIBRAMYCIN) 100 mg in sodium chloride 0.9 % 250 mL IVPB  Status:  Discontinued        100 mg 125 mL/hr over 120 Minutes Intravenous Every 12 hours 10/06/21 1657 10/08/21 0838   09/29/21 1000  ceFEPIme (MAXIPIME) 2 g in sodium chloride 0.9 % 100 mL IVPB        2 g 200 mL/hr over 30 Minutes Intravenous Every 12 hours 09/29/21 0812 09/30/21 2342   09/26/21 2200  ceFEPIme (MAXIPIME) 1 g in sodium chloride 0.9 % 100  mL IVPB  Status:  Discontinued        1 g 200 mL/hr over 30 Minutes Intravenous Every 24 hours 09/26/21 1154 09/29/21 0812   09/26/21 2000  ceFEPIme (MAXIPIME) 2 g in sodium chloride 0.9 % 100 mL IVPB  Status:  Discontinued        2 g 200 mL/hr over 30 Minutes Intravenous Every 12 hours 09/26/21 0809 09/26/21 1154   09/24/21 1200  ceFEPIme (MAXIPIME) 2 g in sodium chloride 0.9 % 100 mL IVPB  Status:  Discontinued        2 g 200 mL/hr over 30 Minutes Intravenous Every 8 hours 09/24/21 0950 09/26/21 0809   09/21/21 1430  azithromycin (ZITHROMAX) 500 mg in sodium chloride 0.9 % 250 mL IVPB        500 mg 250 mL/hr over 60 Minutes Intravenous Every 24 hours 09/21/21 1344 09/23/21 1543   09/21/21 1430  cefTRIAXone (ROCEPHIN) 2 g in sodium chloride 0.9 % 100 mL IVPB  Status:  Discontinued        2 g 200 mL/hr over 30 Minutes Intravenous Every 24 hours 09/21/21 1344 09/24/21 0922        Subjective: Patient was seen and examined today.  No new complaint.  He was asking if he can go home with home health services in a week or so, stating that he is trying his best to work with PT so he can increase be able to go to bathroom.  Objective: Vitals:   11/22/21 2212 11/23/21 0010 11/23/21 0405 11/23/21 0809  BP:    (!) 97/58  Pulse:    82  Resp:    17  Temp:    98.8 F (37.1 C)  TempSrc:    Oral  SpO2: 95% 96% 95% 98%  Weight:      Height:        Intake/Output Summary (Last 24 hours) at 11/23/2021 1434 Last data filed at 11/22/2021 1902 Gross per 24 hour  Intake 480 ml  Output 225 ml  Net 255 ml     Filed Weights   11/20/21 0528 11/21/21 0502 11/22/21 0500  Weight: (!) 189.1 kg (!) 187.3 kg (!) 190.5 kg    Examination:  General.  Morbidly obese gentleman, in no acute distress. Pulmonary.  Lungs clear bilaterally, normal respiratory effort.  Trach collar in place CV.  Regular rate and rhythm, no JVD, rub or murmur. Abdomen.  Soft, nontender, nondistended, BS positive. CNS.  Alert  and oriented .  No focal neurologic deficit. Extremities.  No edema, no cyanosis, pulses intact and symmetrical. Psychiatry.  Judgment and insight appears normal.   Data Reviewed: I have personally reviewed following labs and imaging studies  CBC: Recent Labs  Lab 11/17/21 0629 11/17/21 1151 11/17/21 2102 11/19/21 0646 11/20/21 1233 11/22/21 0554 11/23/21 0934  WBC 11.8*  --   --  10.4  --  10.8* 10.3  NEUTROABS  --   --   --  7.4  --   --   --   HGB 8.2*   < > 7.9* 7.3* 7.9* 7.6* 7.6*  HCT 29.5*   < > 27.6* 26.3* 27.7* 28.1* 28.1*  MCV 80.2  --   --  80.9  --  84.9 85.2  PLT 202  --   --  PLATELET CLUMPS NOTED ON SMEAR, UNABLE TO ESTIMATE  --  PLATELET CLUMPS NOTED ON SMEAR, UNABLE TO ESTIMATE PLATELET CLUMPS NOTED ON SMEAR, UNABLE TO ESTIMATE   < > = values in this interval not displayed.    Basic Metabolic Panel: Recent Labs  Lab 11/17/21 0629 11/18/21 0511 11/19/21 0646 11/22/21 0554 11/23/21 0934  NA 138 139 136 141 141  K 4.8 4.2 4.2 4.4 4.6  CL 100 99 100 107 105  CO2 32 32 28 31 31   GLUCOSE 113* 109* 123* 107* 85  BUN 35* 34* 25* 18 16  CREATININE 1.42* 1.35* 1.26* 1.05 1.04  CALCIUM 8.2* 8.3* 8.0* 8.3* 8.4*  MG  --  2.1 2.1 2.1  --  PHOS  --  3.7 3.6 3.2  --     GFR: Estimated Creatinine Clearance: 128.8 mL/min (by C-G formula based on SCr of 1.04 mg/dL). Liver Function Tests: Recent Labs  Lab 11/18/21 0511  AST 9*  ALT 10  ALKPHOS 57  BILITOT 0.6  PROT 6.3*  ALBUMIN 2.7*    No results for input(s): "LIPASE", "AMYLASE" in the last 168 hours. No results for input(s): "AMMONIA" in the last 168 hours. Coagulation Profile: No results for input(s): "INR", "PROTIME" in the last 168 hours.  Cardiac Enzymes: No results for input(s): "CKTOTAL", "CKMB", "CKMBINDEX", "TROPONINI" in the last 168 hours. BNP (last 3 results) No results for input(s): "PROBNP" in the last 8760 hours. HbA1C: No results for input(s): "HGBA1C" in the last 72  hours. CBG: No results for input(s): "GLUCAP" in the last 168 hours.  Lipid Profile: No results for input(s): "CHOL", "HDL", "LDLCALC", "Gary", "CHOLHDL", "LDLDIRECT" in the last 72 hours. Thyroid Function Tests: No results for input(s): "TSH", "T4TOTAL", "FREET4", "T3FREE", "THYROIDAB" in the last 72 hours. Anemia Panel: Recent Labs    11/22/21 0554 11/22/21 1012  VITAMINB12  --  422  FOLATE 11.9  --   FERRITIN 17*  --   TIBC 333  --   IRON 19*  --   RETICCTPCT 2.3  --     Sepsis Labs: No results for input(s): "PROCALCITON", "LATICACIDVEN" in the last 168 hours.  No results found for this or any previous visit (from the past 240 hour(s)).   Radiology Studies: No results found.  Scheduled Meds:  (feeding supplement) PROSource Plus  30 mL Oral TID PC & HS   arformoterol  15 mcg Nebulization BID   vitamin C  500 mg Oral BID   atorvastatin  10 mg Oral Daily   Chlorhexidine Gluconate Cloth  6 each Topical Daily   cholecalciferol  2,000 Units Oral Daily   escitalopram  5 mg Oral Daily   free water  30 mL Per Tube Q4H   gabapentin  300 mg Oral Q8H   lidocaine  1 patch Transdermal Daily   melatonin  5 mg Oral QHS   mouth rinse  15 mL Mouth Rinse 4 times per day   pantoprazole (PROTONIX) IV  40 mg Intravenous Q12H   sodium chloride flush  3 mL Intravenous Q12H   Continuous Infusions:  sodium chloride Stopped (10/07/21 0542)   sodium chloride       LOS: 65 days    Time spent: 37 Mins  Time spent 37 minutes in management and coordination of care.  This record has been created using Systems analyst. Errors have been sought and corrected,but may not always be located. Such creation errors do not reflect on the standard of care.     Lorella Nimrod, MD [facility]   If 7PM-7AM, please contact night-coverage

## 2021-11-23 NOTE — TOC Progression Note (Signed)
Transition of Care [facility]) - Progression Note    Patient Details  Name: Gary [name] MRN: [id number] Date of Birth: 12-Jan-1956  Transition of Care [facility]) CM/SW [place],  Phone Number: 11/23/2021, 3:42 PM  Clinical Narrative:     Patient off vent, no G tube all PO.   CSW spoke with Thedore Mins with [employer] who is working on ordering patient's trach supplies.   CSW working on [facility] for patient, per Tommi Rumps with [facility] he knows Fayne Norrie with [employer] [facility] PD[employer] who may be able to assist. CSW called Herbie Baltimore at [phone] who requested CSW fax clinical to [phone]. All clinicals have been faxed. Pending Robert's response on benefit eligibility. He does report if approved, family may need to identify a way to have a caregiver support live with patient.   CSW spoke with patient's sister [name] who reports family is able to provide support in the form of check ins and meals, however patient is too large and they are too old to assist in physical mobility. Patient will need ot continue to work with PT to ensure he can dc home and be able to care for self.   Expected Discharge Plan: Long Term Acute Care (LTAC) Barriers to Discharge: Continued Medical Work up  Expected Discharge Plan and Services Expected Discharge Plan: Long Term Acute Care (LTAC)   Discharge Planning Services: CM Consult Post Acute Care Choice: Long Term Acute Care (LTAC) Living arrangements for the past 2 months: Single Family Home                 DME Arranged: N/A DME Agency: NA       HH Arranged: NA HH Agency: NA         Social Determinants of Health (SDOH) Interventions    Readmission Risk Interventions    05/04/2021    1:26 PM 04/07/2021    1:39 PM 03/12/2021   11:54 AM  Readmission Risk Prevention Plan  Transportation Screening Complete Complete Complete  PCP or Specialist Appt within 3-5 Days  Complete Complete  HRI or [place]   Complete  Social Work Consult for [place]  Planning/Counseling   Complete  Palliative Care Screening  Not Applicable Not Applicable  Medication Review Press photographer) Complete Complete Complete  PCP or Specialist appointment within 3-5 days of discharge Complete    HRI or [place] Complete    SW Recovery Care/Counseling Consult Complete    [place] Not Applicable

## 2021-11-24 DIAGNOSIS — J9621 Acute and chronic respiratory failure with hypoxia: Secondary | ICD-10-CM | POA: Diagnosis not present

## 2021-11-24 DIAGNOSIS — J9622 Acute and chronic respiratory failure with hypercapnia: Secondary | ICD-10-CM | POA: Diagnosis not present

## 2021-11-24 MED ORDER — FE FUMARATE-B12-VIT C-FA-IFC PO CAPS
1.0000 | ORAL_CAPSULE | Freq: Two times a day (BID) | ORAL | Status: DC
  Administered 2021-11-24 (×2): 1 via ORAL
  Filled 2021-11-24 (×3): qty 1

## 2021-11-24 NOTE — TOC Progression Note (Addendum)
Transition of Care [facility]) - Progression Note    Patient Details  Name: Gary [name] MRN: [id number] Date of Birth: 21-Sep-1955  Transition of Care [facility]) CM/SW [place], LCSW Phone Number: 11/24/2021, 11:42 AM  Clinical Narrative:  Damaris Schooner with Earnest Rosier with [facility]. He received fax that was sent to him yesterday and has forwarded it to his team to check patient's Medicaid policy and staffing availability. He will follow up once information obtained. Rob did say that patient will have to have a primary caregiver in the home, whether family moves in with him or he moves in with a family member. He will have to have 24/7 supervision. Will wait to follow up with family until all information obtained from [place].  Expected Discharge Plan: Long Term Acute Care (LTAC) Barriers to Discharge: Continued Medical Work up  Expected Discharge Plan and Services Expected Discharge Plan: Long Term Acute Care (LTAC)   Discharge Planning Services: CM Consult Post Acute Care Choice: Long Term Acute Care (LTAC) Living arrangements for the past 2 months: Single Family Home                 DME Arranged: N/A DME Agency: NA       HH Arranged: NA HH Agency: NA         Social Determinants of Health (SDOH) Interventions    Readmission Risk Interventions    05/04/2021    1:26 PM 04/07/2021    1:39 PM 03/12/2021   11:54 AM  Readmission Risk Prevention Plan  Transportation Screening Complete Complete Complete  PCP or Specialist Appt within 3-5 Days  Complete Complete  HRI or [place]   Complete  Social Work Consult for [place] Planning/Counseling   Complete  Palliative Care Screening  Not Applicable Not Applicable  Medication Review Press photographer) Complete Complete Complete  PCP or Specialist appointment within 3-5 days of discharge Complete    HRI or [place] Complete    SW Recovery Care/Counseling Consult Complete    [place] Not Applicable

## 2021-11-24 NOTE — Plan of Care (Signed)
Patient axox4, takes meds whole, worked with PT was able to stand and only take a couple of steps. Patient complained of discomfort to left heel, no opened area seen, skin was darkened in the area, nurse placed heel foams bilaterally and elevated foot on pillow. Enteral tube in place and trach no issues with either.  Problem: Education: Goal: Ability to demonstrate management of disease process will improve Outcome: Progressing Goal: Ability to verbalize understanding of medication therapies will improve Outcome: Progressing   Problem: Activity: Goal: Capacity to carry out activities will improve Outcome: Progressing   Problem: Cardiac: Goal: Ability to achieve and maintain adequate cardiopulmonary perfusion will improve Outcome: Progressing   Problem: Education: Goal: Knowledge of disease or condition will improve Outcome: Progressing Goal: Knowledge of the prescribed therapeutic regimen will improve Outcome: Progressing   Problem: Activity: Goal: Ability to tolerate increased activity will improve Outcome: Progressing Goal: Will verbalize the importance of balancing activity with adequate rest periods Outcome: Progressing   Problem: Respiratory: Goal: Ability to maintain a clear airway will improve Outcome: Progressing Goal: Levels of oxygenation will improve Outcome: Progressing Goal: Ability to maintain adequate ventilation will improve Outcome: Progressing   Problem: Education: Goal: Knowledge of General Education information will improve Description: Including pain rating scale, medication(s)/side effects and non-pharmacologic comfort measures Outcome: Progressing   Problem: Health Behavior/Discharge Planning: Goal: Ability to manage health-related needs will improve Outcome: Progressing   Problem: Clinical Measurements: Goal: Ability to maintain clinical measurements within normal limits will improve Outcome: Progressing Goal: Will remain free from  infection Outcome: Progressing Goal: Diagnostic test results will improve Outcome: Progressing Goal: Respiratory complications will improve Outcome: Progressing Goal: Cardiovascular complication will be avoided Outcome: Progressing   Problem: Activity: Goal: Risk for activity intolerance will decrease Outcome: Progressing   Problem: Nutrition: Goal: Adequate nutrition will be maintained Outcome: Progressing   Problem: Coping: Goal: Level of anxiety will decrease Outcome: Progressing   Problem: Elimination: Goal: Will not experience complications related to bowel motility Outcome: Progressing Goal: Will not experience complications related to urinary retention Outcome: Progressing   Problem: Pain Managment: Goal: General experience of comfort will improve Outcome: Progressing   Problem: Safety: Goal: Ability to remain free from injury will improve Outcome: Progressing   Problem: Skin Integrity: Goal: Risk for impaired skin integrity will decrease Outcome: Progressing   Problem: Education: Goal: Ability to describe self-care measures that may prevent or decrease complications (Diabetes Survival Skills Education) will improve Outcome: Progressing   Problem: Coping: Goal: Ability to adjust to condition or change in health will improve Outcome: Progressing   Problem: Fluid Volume: Goal: Ability to maintain a balanced intake and output will improve Outcome: Progressing   Problem: Metabolic: Goal: Ability to maintain appropriate glucose levels will improve Outcome: Progressing   Problem: Health Behavior/Discharge Planning: Goal: Ability to identify and utilize available resources and services will improve Outcome: Progressing Goal: Ability to manage health-related needs will improve Outcome: Progressing   Problem: Nutritional: Goal: Maintenance of adequate nutrition will improve Outcome: Progressing Goal: Progress toward achieving an optimal weight will  improve Outcome: Progressing   Problem: Skin Integrity: Goal: Risk for impaired skin integrity will decrease Outcome: Progressing   Problem: Tissue Perfusion: Goal: Adequacy of tissue perfusion will improve Outcome: Progressing   Problem: Education: Goal: Understanding of CV disease, CV risk reduction, and recovery process will improve Outcome: Progressing Goal: Individualized Educational Video(s) Outcome: Progressing   Problem: Activity: Goal: Ability to return to baseline activity level will improve Outcome: Progressing  Problem: Cardiovascular: Goal: Ability to achieve and maintain adequate cardiovascular perfusion will improve Outcome: Progressing Goal: Vascular access site(s) Level 0-1 will be maintained Outcome: Progressing   Problem: Health Behavior/Discharge Planning: Goal: Ability to safely manage health-related needs after discharge will improve Outcome: Progressing

## 2021-11-24 NOTE — Progress Notes (Signed)
Occupational Therapy Treatment Patient Details Name: Gary [name] MRN: [id number] DOB: 05-15-1955 Today's Date: 11/24/2021   History of present illness 66 y/o male admitted mid June secondary to SOB, COPD exacerbation, acute metabolic encephalopathy, acute on chronic hypoxic and hypercapnic respiratory failure in setting of acute decompensated HFpEF and AECOPD requiring intubation and mechanical ventilation. The pt is now s/p tracheostomy and PEG tube placement. New medical complications including GI bleed and multiple patient refusuals.   OT comments  Chart reviewed, co tx completed today with PT. Pt is agreeable to session following PT arranging, then re-arranging session to accommodate for visitors. Tx session targeted improving activity tolerance to facilitate SPT to bedside chair as pt reports he wants to return home. MIN-MOD A +2 required for STS 3 attempts with B knees blocked, step by step vcs. Attempted SPT with knees blocked, however pt declining progressing transfer. Then attempted lateral scoot transfer via transfer board. Pt attempts to complete with b knees blocked by PT, step by step vcs, able to transition approx halfway and reports he needs to return to bed. TOTAL A required for peri care following BM, MOD A required for rolling on this date. Pt able to self boost in bed with use of bed rails with MOD A. Pt continues to appear self limiting in progress however demonstrated progress towards goals on this date and improving mobility. Benefited from +2 assist. RN educated hoyer lift sling left in room for transfer to chair, however if pt wants to return home SPT appears to be required at this time. Pt aware. Pt is left as received, NAD, all needs met. OT will continue to follow acutely.    Recommendations for follow up therapy are one component of a multi-disciplinary discharge planning process, led by the attending physician.  Recommendations may be updated based on patient status,  additional functional criteria and insurance authorization.    Follow Up Recommendations  Skilled nursing-short term rehab (<3 hours/day)    Assistance Recommended at Discharge Frequent or constant Supervision/Assistance  Patient can return home with the following  Two people to help with walking and/or transfers;Assistance with cooking/housework;Direct supervision/assist for medications management;Assist for transportation;Two people to help with bathing/dressing/bathroom   Equipment Recommendations  Other (comment) (per next venue of care)    Recommendations for Other Services      Precautions / Restrictions         Mobility Bed Mobility Overal bed mobility: Needs Assistance Bed Mobility: Supine to Sit, Sit to Supine, Rolling Rolling: Mod assist   Supine to sit: Mod assist, +2 for physical assistance Sit to supine: Max assist, +2 for physical assistance        Transfers Overall transfer level: Needs assistance Equipment used: Rolling walker (2 wheels) Transfers: Sit to/from Stand Sit to Stand: +2 physical assistance, From elevated surface, Min assist, Mod assist, +2 safety/equipment          Lateral/Scoot Transfers: From elevated surface, Min assist, Mod assist (B knees blocked, +1) General transfer comment: Pt transfer via slide board approx 1/2 way and reports he wants to return to bed as he is too tired to continue transfer despite encouragement from therapists     Balance Overall balance assessment: Needs assistance Sitting-balance support: Feet unsupported, Bilateral upper extremity supported Sitting balance-[name] Scale: Good     Standing balance support: Bilateral upper extremity supported, During functional activity, Reliant on assistive device for balance Standing balance-[name] Scale: Fair  ADL either performed or assessed with clinical judgement   ADL Overall ADL's : Needs assistance/impaired                                        General ADL Comments: TOTAL A for peri care following BM    Extremity/Trunk Assessment              Vision       Perception     Praxis      Cognition Arousal/Alertness: Awake/alert Behavior During Therapy: WFL for tasks assessed/performed Overall Cognitive Status: Within Functional Limits for tasks assessed                                 General Comments: Pt is alert and oriented x4, increased time and encouragement for task completion        Exercises Other Exercises Other Exercises: importance of oob mobility    Shoulder Instructions       General Comments      Pertinent Vitals/ Pain       Pain Assessment Pain Assessment: Faces Faces Pain Scale: Hurts little more Pain Location: LUE with movement Pain Descriptors / Indicators: Grimacing, Aching, Guarding Pain Intervention(s): Limited activity within patient's tolerance, Monitored during session, Repositioned  Home Living                                          Prior Functioning/Environment              Frequency  Min 2X/week        Progress Toward Goals  OT Goals(current goals can now be found in the care plan section)  Progress towards OT goals: Progressing toward goals     Plan Discharge plan remains appropriate;Frequency remains appropriate    Co-evaluation    PT/OT/SLP Co-Evaluation/Treatment: Yes Reason for Co-Treatment: Necessary to address cognition/behavior during functional activity;For patient/therapist safety;To address functional/ADL transfers   OT goals addressed during session: ADL's and self-care      AM-PAC OT "6 Clicks" Daily Activity     Outcome Measure   Help from another person eating meals?: None Help from another person taking care of personal grooming?: None Help from another person toileting, which includes using toliet, bedpan, or urinal?: Total Help from another person bathing  (including washing, rinsing, drying)?: A Lot Help from another person to put on and taking off regular upper body clothing?: A Little Help from another person to put on and taking off regular lower body clothing?: A Lot 6 Click Score: 16    End of Session Equipment Utilized During Treatment: Oxygen;Rolling walker (2 wheels)  OT Visit Diagnosis: Other abnormalities of gait and mobility (R26.89);Muscle weakness (generalized) (M62.81)   Activity Tolerance Patient tolerated treatment well   Patient Left in bed;with call bell/phone within reach   Nurse Communication Mobility status        Time: 9604-5409 OT Time Calculation (min): 39 min  Charges: OT General Charges $OT Visit: 1 Visit OT Treatments $Therapeutic Activity: 8-22 mins  Shanon Payor, OTD OTR/L  11/24/21, 4:19 PM

## 2021-11-24 NOTE — Progress Notes (Signed)
Physical Therapy Treatment Patient Details Name: Gary [name] MRN: [id number] DOB: 09/29/1955 Today's Date: 11/24/2021   History of Present Illness 66 y/o male admitted mid June secondary to SOB, COPD exacerbation, acute metabolic encephalopathy, acute on chronic hypoxic and hypercapnic respiratory failure in setting of acute decompensated HFpEF and AECOPD requiring intubation and mechanical ventilation. The pt is now s/p tracheostomy and PEG tube placement. New medical complications including GI bleed and multiple patient refusuals.    PT Comments    PT/OT co treat. Pt was agreeable after pt's sister and niece were at hospital for short visit. He does put forth great effort today but continues to be limited. Tolerated standing 2 x ~ 1 minute. Unable to advance to taking steps due to LE weakness + pt's anxiety. Attempted to transfer to chair via slideboard. Pt again becomes anxious however was able to perform ~ 1/2 the transfer to chair with + min assist blocking knees. PT/OT wil;l continue to advance pt as able per current POC. Pt needs all disciplines support for encouraging increased activity throughout the day. Very little SOB noted during session with passy muir valved removed mid session for pt comfort. Some productive secretion noted. Pt did not want to be suctioned," I can clear it myself." Pt is difficult to place and unwilling to go to rehab. Will continue to progress pt towards PT goals in hopes pt can safely DC home with assist.   Recommendations for follow up therapy are one component of a multi-disciplinary discharge planning process, led by the attending physician.  Recommendations may be updated based on patient status, additional functional criteria and insurance authorization.  Follow Up Recommendations  Home health PT (Once pt is able to safely perform transfers OOB><chair/BSC/ w/c) Can patient physically be transported by private vehicle: No   Assistance Recommended at  Discharge Frequent or constant Supervision/Assistance  Patient can return home with the following Two people to help with walking and/or transfers;Two people to help with bathing/dressing/bathroom;Assistance with cooking/housework;Assist for transportation;Help with stairs or ramp for entrance   Equipment Recommendations  None recommended by PT       Precautions / Restrictions Precautions Precautions: Fall Restrictions Weight Bearing Restrictions: No     Mobility  Bed Mobility Overal bed mobility: Needs Assistance Bed Mobility: Supine to Sit, Sit to Supine, Rolling Rolling: Mod assist   Supine to sit: Mod assist, +2 for physical assistance Sit to supine: Max assist, +2 for physical assistance   General bed mobility comments: pt requires more assistance returning to supine from EOB short sit then to exit bed. LUE pain/limited use impacts bed mobility significantly. max assist to progress BLEs into bed. INcreased time for all task. pt likes to dictate session progression.    Transfers Overall transfer level: Needs assistance Equipment used: Rolling walker (2 wheels) Transfers: Sit to/from Stand Sit to Stand: +2 physical assistance, From elevated surface, Min assist, Mod assist, +2 safety/equipment          Lateral/Scoot Transfers: From elevated surface, Min assist, Mod assist General transfer comment: Pt did perform STS 3 x with 2 times fully erect and first attempt semi erect. Pt was able to tolerate standing for ~ 1 minute. Unable to clear LEs to advance to taking steps to recliner. Author feels moreso fearful/anxiety limiting however severe LLE weakness was observed. Recommend knee blocking LLE when attempting to advance RLE.Attempted slideboard to chair after standing trials.Pt transfer via slide board approx 1/2 way and reports he wants to return to bed  as he is too tired to continue transfer despite encouragement from therapists    Ambulation/Gait                General Gait Details: unable currently however progress towards being able to step pivot in future made.    Balance Overall balance assessment: Needs assistance Sitting-balance support: Feet unsupported, Bilateral upper extremity supported Sitting balance-[name] Scale: Good Sitting balance - Comments: seated on edge of bed with good dynamic sitting balance   Standing balance support: Bilateral upper extremity supported, During functional activity, Reliant on assistive device for balance Standing balance-[name] Scale: Fair Standing balance comment: pt was able to stand fully erect with heavy UE support and little assist(mostly encouragement)        Cognition Arousal/Alertness: Awake/alert Behavior During Therapy: WFL for tasks assessed/performed Overall Cognitive Status: Within Functional Limits for tasks assessed Area of Impairment: Safety/judgement, Awareness     Following Commands: Follows one step commands with increased time, Follows one step commands consistently Safety/Judgement: Decreased awareness of deficits, Decreased awareness of safety Awareness: Intellectual Problem Solving: Slow processing, Requires verbal cues, Requires tactile cues General Comments: Pt is alert and oriented x4, increased time and encouragement for task completion           General Comments General comments (skin integrity, edema, etc.): throughout session pt demonstrates strength required to perform more activity. he was able to reposition to reposition to [facility] with use of UEs + LEs + min assist. pt would greatly benefit from increased activity throughout the day. Will give more aggressive HEP in future sessions.      Pertinent Vitals/Pain Pain Assessment Pain Location: LUE with movement Pain Descriptors / Indicators: Grimacing, Aching, Guarding Pain Intervention(s): Limited activity within patient's tolerance, Monitored during session, Repositioned     PT Goals (current goals can now be found  in the care plan section) Acute Rehab PT Goals Patient Stated Goal: home Progress towards PT goals: Progressing toward goals    Frequency    Min 2X/week      PT Plan Current plan remains appropriate    Co-evaluation PT/OT/SLP Co-Evaluation/Treatment: Yes Reason for Co-Treatment: Complexity of the patient's impairments (multi-system involvement);Necessary to address cognition/behavior during functional activity;For patient/therapist safety;To address functional/ADL transfers PT goals addressed during session: Mobility/safety with mobility;Balance;Proper use of DME;Strengthening/ROM        AM-PAC PT "6 Clicks" Mobility   Outcome Measure  Help needed turning from your back to your side while in a flat bed without using bedrails?: A Lot Help needed moving from lying on your back to sitting on the side of a flat bed without using bedrails?: A Lot Help needed moving to and from a bed to a chair (including a wheelchair)?: Total Help needed standing up from a chair using your arms (e.g., wheelchair or bedside chair)?: A Lot Help needed to walk in hospital room?: Total Help needed climbing 3-5 steps with a railing? : Total 6 Click Score: 9    End of Session Equipment Utilized During Treatment: Other (comment);Oxygen (slideboard/ trach collar) Activity Tolerance: Patient tolerated treatment well;Patient limited by fatigue (anxiety/fear limiting)   Nurse Communication: Mobility status PT Visit Diagnosis: Unsteadiness on feet (R26.81);Muscle weakness (generalized) (M62.81);Difficulty in walking, not elsewhere classified (R26.2)     Time: 3559-7416 PT Time Calculation (min) (ACUTE ONLY): 39 min  Charges:  $Therapeutic Activity: 23-37 mins                     Julaine Fusi PTA 11/24/21,  8:42 PM

## 2021-11-24 NOTE — Progress Notes (Addendum)
PROGRESS NOTE    Gary [name]  IEP:[id number] DOB: [birth year] DOA: 09/19/2021 PCP: [facility] [place]   Brief Narrative: This 66 y/o male with multiple medical problems causing chronic hypercapneic respiratory failure who has had admissions in the past for exacerbations of the same and mechanical ventilation presented for evaluation of dyspnea, weakness. It is unclear if the patient has been compliant with his medications and NIMV at home.  He was admitted by the hospitalists for a presumed COPD exacerbation in the ER, treated with bronchodilators and NIMV but his mental status and hypercarbia worsened despite those interventions so he required intubation.   6/6: Presented to ED.  Required intubation and mechanical ventilation in the ED.  PCCM admitted in ICU 6/7: Hold diuresis due to worsening Creatinine.  ENT consulted for Trach placement per family request 6/8: Pt continues to spike temps tmax 101.8 degrees F.  Flexiseal placed overnight draining foul smelling watery stool~Cdiff and GI panel negative 6/9: Fever resolved.  Negative for C- diff, GI panel still pending.  Creatinine slightly worsened, requiring low dose levophed (2 mcg). Holding diuresis 6/10: propofol discontinued due hypertriglyceridemia; failed SBT 6/11: Tracheal aspirate from 6/6 resulting with Pseudomonas, ABX changed to Cefepime. neurologically intact, awaiting Trach placement 6/12: Pt calmer today following addition of oral benzo's, narcotics, and Seroquel via tube.  Creatinine worsened with decreased UOP, holding diuresis, low threshold for Nephrology consult  6/13: Pt with worsening renal failure creatinine 4.15.  UOP overnight 25 ml.  Nephrology consulted plans to start pt on hemodialysis follow dialysis catheter placement Pt underwent hemodialysis without ultrafiltration 06/14: .  Plans for HD session today.  Agitation has improved significantly with narcotics and antianxiety medications per tube   06/16:  Tracheostomy canceled 06/15 due to hyperkalemia and tentatively rescheduled for 06/20  6/18 failure to wean from vent 06/20: ENT placed Tracheostomy (#7.0 XLT cuffed).  Developed mild epistaxis post unsuccessful attempt at NGT placement.  Later developed hematochezia/melena, Protonix gtt started and GI consulted. 06/21: Diuresed yesterday with limited results. Patient had softed blood pressures throughout the day and diuresis was held as a result. HD was performed by the patient clotted off the machine and was not able to be rinsed back. Repeat hemoglobin is above 8. 06/23: Pt remains mechanically intubated settings: FiO2 50%/PEEP 10.  CXR concerning for moderate right pleural effusion with associated right basilar atelectasis vs. infiltrate.  Will perform [place] Chest to determine if pt needs right sided thoracentesis  06/24: Remains on the ventilator, FiO2 requirements decreasing, respiratory culture with abundant gram-negative rods: [name], on Meropenem 06/25: Oxygen requirements down to 40%, chest x-ray shows improvement on dense right consolidation, Pseudomonas susceptibilities pending 06/25: Pt remains on the ventilator settings: PEEP 10 and FiO2 40%; will attempt to wean PEEP to 5 to perform SBT  06/28: Pt tolerated PS on 06/27.  Overnight pt had episodes of vomiting TF's suspect secondary to malposition of nasogastric tube.  Will consult IR for PEG tube placement if unable to place PEG tube pt will need postpyloric Dobbhoff   06/29: Pt tolerated PS 5/5 for over an hour yesterday, however due to fatigue placed back on PRVC.  Pt pulled NGT out several times overnight, therefore will not attempt to reinsert.  Plans for PEG tube placement per IR on 06/30.   06/30: PEG tube placed per IR  07/1: Pt tolerated trach collar _0 % throughout the day 07/2: Pt placed on ventilator overnight settings 16/5/35% tolerating well.  Will place pt on trach  collar today and place orders for PT/OT 7/3:  Hemodynamically stable.  Tolerating TC (has remained on TC since 7/2 @ 08:00), plan for speech therapy and PT/OT 7/4: Has maintained on trach collar for greater than 48 hours, tolerating Passy-Muir valve.  Plan to transfer out of ICU. SLP recs: dysphagia 1 (puree) w/ nectar liquid.  7/5-7/6: sodium and creatinine trending appropriately, Hgb stable. Low K repleted and stable. Was off vent qhs (orders d/c or missed?) and was restarted. Will need LTAC, TOC following  7/7: pulled out trach overnight. Confusion re: vent plan among staff and myself. I asked PCCM to reevluate him and verify plan so the team and the patient can eb on the same page, and will help TOC arrange appropriate placement.  7/8: I spoke personally w/ Dr Milon Dikes [facility]) and he confirms patient does NOT need ventilator support, unless of course develops distress, but last night the patient tolerated well without it. Plan to meet w/ family today when they visit, I spoke w/ his sister yesterday on the phone. No concerns on CBC/BMP: Cr tending down a bit, Hgb stable, Na slightly high but improving over past few days. He is approaching medically stable and may be appropriate for LTAC vs SNF soon  7/9: Several long discussions with patient and family about medical condition, behavioral issues.  See separate progress notes.  Psychiatry evaluated, agree that he has capacity, started SSRI. 7/10: Patient remains medically stable, awaiting placement.  Discussed his behavioral issues again and encouraged him to always bring any concerns to nursing staff or to me calmly and I am happy to listen, requested he maintain professionalism towards staff, he seems amenable. 7/11-7/14: goal OOB to chair, needing hoyer lift, pt reeducated several times about staying in bed 07/11 and has been compliant through 07/14. Finally got bariatric chair and hoyer lift 7/12 and was OOB 07/13.  D/c monitoring and sitter and transferred to [place] status 07/13. Awaiting placement.   7/15-17- working to reorient patient, but personality and coherence remain difficult. Stable for placement. Reminded patient not to verbally abuse staff 7/28 - rising O2 needs past couple of days, due to mucus plugging.  ABG stable, normal mentation.  Improved after suctioning. 8/2: Patient awaiting placement in a long-term acute care setting.. 8/3: Patient had multiple episodes of maroon-colored big bowel movements through out night.  H&H remains stable, vitals stable but patient at high risk for decompensation.  Patient transferred to ICU.  2 unit PRBC given, GI Dr. Lynnell Jude notified,  Nuclear scan positive for active GI bleeding in the proximal colon, vascular surgery Dr. Delana Meyer and ICU Dr. Lanney Gins notified.  Patient underwent embolization of the bleeding artery. 8/4: Patient continues to have bright red blood per rectum.  Patient underwent flex sigmoidoscopy showed blood in the rectum, rectosigmoid colon, diverticulosis.  Hemoglobin dropped to 7.6.  Transfused 2 unit PRBC.  If patient continues to bleed may require general surgery evaluation. 8/5-8/8: Patient remains hemodynamically stable, Patient reports very little blood in the stools.  1 unit PRBC given, Hb 7.9. 8/9: Patient remained stable, no more bleeding per rectum.  Received total of 3 unit of PRBC.  Hemoglobin at 7.6, anemia panel with concern of iron deficiency.  Iron replacement ordered. Patient remained stable.  He was asking if he can go home in the next week or so with home health services.  Per patient he is trying his best to work with PT, they recommend patient remained for SNF. 8/11: Patient remained stable.  Started working  with PT.  He was asking to remove G-tube as he was eating and drinking okay and has not used that tube for a while-message sent to GI.  He was hoping to be able to go home.  Assessment & Plan:   Principal Problem:   Acute on chronic respiratory failure with hypoxia and hypercapnia ([name]) Active Problems:    Major depressive disorder, recurrent episode, moderate ([name])   Bloody stool   COPD with acute exacerbation ([name])   Acute metabolic encephalopathy   HTN (hypertension)   Acute on chronic diastolic congestive heart failure ([name])   Obesity, Class III, BMI 40-49.9 (morbid obesity) ([place])   Obstructive sleep apnea   Chronic kidney disease, stage 3a ([name])   HLD (hyperlipidemia)   Pressure injury of skin   Hematochezia  Acute GI bleed /acute blood loss anemia: Improved. Nuclear scan positive for active GI bleeding in the proximal colon. Vascular surgery Dr. Delana Meyer has attempted embolization of the bleeding artery. Dr. Allen [name] with GI attempted flex sigmoidoscopy which shows blood in the sigmoid colon, rectosigmoid, diverticulum.  Received total of 3 unit of PRBC Continue Protonix 40 mg IV every 12 hours daily. Anemia panel with iron deficiency. -Received IV iron -Start him on p.o. supplement -Monitor hemoglobin -Transfuse if below 7 -Consult general surgery if continue to bleed   Left foot blood blister - appreciate podiatry evaluation; He underwent removal of blister at bedside by podiatry on 7/21 -  Use prevalon boots to aid with pressure wounds going forward.   Acute on Chronic Hypoxic Hypercapnic Respiratory Failure Acute exacerbation COPD Pseudomonal PNA OSA/OHS - S/p  tracheostomy by ENT by 6/20. - Continue Trach collar w/ ventilator support as needed / qhs but he does NOT need vent support routinely per conversation w/ Dr Milon Dikes 07/08 - Completed 10 day course of ceftazidime for treatment of Pseudomonas. - At this point, he is medically stable but placement is going to be an issue for him.   -  His behavioral issues have been discussed with him and his behavior has improved  - Monitor mental status closely and check blood gas PRN - Has OHS and was reliant on nightly bipap prior to trach 7/29-8/2: stable on 10 L/min, no reports of significant de-sat episodes.   Bilateral hand  and foot pain -  He has hx of gout, treated for a few days with colchicine last week.   Now having diffuse joint pains in hands and feet.   Pt and family  reports hx. of rheumatoid arthritis.   Uric acid level was normal last week ESR 48, CRP 4.2 - nonspecific Rheumatoid factor elevated 58.8 --Monitor for improvement   Acute decompensated HFpEF Hypertension - Echocardiogram 08/07/2021: LVEF 60 to 67%, grade 1 diastolic dysfunction, normal RV systolic function - Was on intermittent hemodialysis for volume removal. - has not needed HD and has remained compensated. Cancelled permacath placement  - resumed home torsemide 7/27   AKI superimposed CKD stage IIIa > Resolved Hypokalemia > Resolved. Hypernatremia > Resolved. 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 Diff negative.   Acute Blood Loss Anemia - resolved  Nasopharyngeal trauma vs Upper GI bleed - Continue BID IV PPI - per GI, no need for scope    Hyperglycemia - CBG's achs; patient refusing at times - SSI  Acute Metabolic Encephalopathy in setting of CO2 Narcosis Sedation needs in setting of mechanical ventilation - CT  Head 09/19/21: negative;  UDS negative - Treatment of hypercapnia as outlined above   Trach in place Gtube in place - No longer requiring vent support - PO intake has improved, SLP and dietary following  -Continue trach collar supplemental O2 to maintain sats >88% - Continues to eat well; G-tube will have to remain in place at least 3 months prior to removal    Morbid obesity: Estimated body mass index is 53.25 kg/m as calculated from the following:   Height as of this encounter: $RemoveBeforeD'6\' 3"'[id number]$  (1.905 m).   Weight as of this encounter: 193.2 kg.  This  complicate the hospital course. Diet and exercise discussed in detail.   DVT prophylaxis: SCDs Code Status: Full code. Family Communication: Called  sister with no response. Disposition Plan:   Status is: Inpatient Remains inpatient appropriate because: Awaiting SNF placement. Barriers; difficult disposition.  Consultants:  PCCU Nephrology ENT GI IR Psychiatry  Procedures:  10/03/21: tracheostomy placement 10/12/21: dialysis permacath placement  10/13/21: G-tube placed    Antimicrobials:  Anti-infectives (From admission, onward)    Start     Dose/Rate Route Frequency Ordered Stop   11/16/21 1651  ceFAZolin (ANCEF) IVPB 1 g/50 mL premix        over 30 Minutes  Continuous PRN 11/16/21 1651 11/16/21 1837   11/16/21 1559  ceFAZolin (ANCEF) IVPB 2g/100 mL premix        2 g 200 mL/hr over 30 Minutes Intravenous 30 min pre-op 11/16/21 1559 11/16/21 1700   10/12/21 1200  cefTAZidime (FORTAZ) 2 g in sodium chloride 0.9 % 100 mL IVPB        2 g 200 mL/hr over 30 Minutes Intravenous 2 times daily 10/12/21 1122 10/17/21 0844   10/12/21 0417  ceFAZolin (ANCEF) IVPB 1 g/50 mL premix  Status:  Discontinued        1 g 100 mL/hr over 30 Minutes Intravenous 30 min pre-op 10/12/21 0417 10/16/21 0751   10/09/21 2000  ceFEPIme (MAXIPIME) 1 g in sodium chloride 0.9 % 100 mL IVPB  Status:  Discontinued        1 g 200 mL/hr over 30 Minutes Intravenous Every 24 hours 10/09/21 1404 10/09/21 1515   10/09/21 2000  cefTAZidime (FORTAZ) 1 g in sodium chloride 0.9 % 100 mL IVPB  Status:  Discontinued        1 g 200 mL/hr over 30 Minutes Intravenous Every 24 hours 10/09/21 1515 10/12/21 1122   10/06/21 1830  meropenem (MERREM) 500 mg in sodium chloride 0.9 % 100 mL IVPB  Status:  Discontinued        500 mg 200 mL/hr over 30 Minutes Intravenous Every 24 hours 10/06/21 1735 10/09/21 1403   10/06/21 1800  doxycycline (VIBRAMYCIN) 100 mg in sodium chloride 0.9 % 250 mL IVPB  Status:  Discontinued        100 mg 125 mL/hr over 120 Minutes Intravenous Every 12 hours 10/06/21 1657 10/08/21 0838   09/29/21 1000  ceFEPIme (MAXIPIME) 2 g in sodium chloride  0.9 % 100 mL IVPB        2 g 200 mL/hr over 30 Minutes Intravenous Every 12 hours 09/29/21 0812 09/30/21 2342   09/26/21 2200  ceFEPIme (MAXIPIME) 1 g in sodium chloride 0.9 % 100 mL IVPB  Status:  Discontinued        1 g 200 mL/hr over 30 Minutes Intravenous Every 24 hours 09/26/21 1154 09/29/21 0812   09/26/21 2000  ceFEPIme (MAXIPIME) 2 g in sodium chloride  0.9 % 100 mL IVPB  Status:  Discontinued        2 g 200 mL/hr over 30 Minutes Intravenous Every 12 hours 09/26/21 0809 09/26/21 1154   09/24/21 1200  ceFEPIme (MAXIPIME) 2 g in sodium chloride 0.9 % 100 mL IVPB  Status:  Discontinued        2 g 200 mL/hr over 30 Minutes Intravenous Every 8 hours 09/24/21 0950 09/26/21 0809   09/21/21 1430  azithromycin (ZITHROMAX) 500 mg in sodium chloride 0.9 % 250 mL IVPB        500 mg 250 mL/hr over 60 Minutes Intravenous Every 24 hours 09/21/21 1344 09/23/21 1543   09/21/21 1430  cefTRIAXone (ROCEPHIN) 2 g in sodium chloride 0.9 % 100 mL IVPB  Status:  Discontinued        2 g 200 mL/hr over 30 Minutes Intravenous Every 24 hours 09/21/21 1344 09/24/21 0922        Subjective: Patient was seen and examined today.  He was asking about removal of G-tube as it is not being used for a while.  Able to work with PT.  Objective: Vitals:   11/24/21 0418 11/24/21 0500 11/24/21 0751 11/24/21 0845  BP:    (!) 159/126  Pulse:    79  Resp:    (!) 21  Temp:    97.7 F (36.5 C)  TempSrc:    Oral  SpO2: 95%  91% 100%  Weight:  (!) 193.2 kg    Height:        Intake/Output Summary (Last 24 hours) at 11/24/2021 1458 Last data filed at 11/24/2021 1047 Gross per 24 hour  Intake 400 ml  Output 2850 ml  Net -2450 ml     Filed Weights   11/21/21 0502 11/22/21 0500 11/24/21 0500  Weight: (!) 187.3 kg (!) 190.5 kg (!) 193.2 kg    Examination:  General.  Morbidly obese gentleman, in no acute distress. Pulmonary.  Lungs clear bilaterally, normal respiratory effort. CV.  Regular rate and rhythm, no  JVD, rub or murmur. Abdomen.  Soft, nontender, nondistended, BS positive.  G-tube in place CNS.  Alert and oriented .  No focal neurologic deficit. Extremities.  No edema, no cyanosis, pulses intact and symmetrical. Psychiatry.  Judgment and insight appears normal.   Data Reviewed: I have personally reviewed following labs and imaging studies  CBC: Recent Labs  Lab 11/17/21 2102 11/19/21 0646 11/20/21 1233 11/22/21 0554 11/23/21 0934  WBC  --  10.4  --  10.8* 10.3  NEUTROABS  --  7.4  --   --   --   HGB 7.9* 7.3* 7.9* 7.6* 7.6*  HCT 27.6* 26.3* 27.7* 28.1* 28.1*  MCV  --  80.9  --  84.9 85.2  PLT  --  PLATELET CLUMPS NOTED ON SMEAR, UNABLE TO ESTIMATE  --  PLATELET CLUMPS NOTED ON SMEAR, UNABLE TO ESTIMATE PLATELET CLUMPS NOTED ON SMEAR, UNABLE TO ESTIMATE    Basic Metabolic Panel: Recent Labs  Lab 11/18/21 0511 11/19/21 0646 11/22/21 0554 11/23/21 0934  NA 139 136 141 141  K 4.2 4.2 4.4 4.6  CL 99 100 107 105  CO2 32 $Remo'28 31 31  'crHor$ GLUCOSE 109* 123* 107* 85  BUN 34* 25* 18 16  CREATININE 1.35* 1.26* 1.05 1.04  CALCIUM 8.3* 8.0* 8.3* 8.4*  MG 2.1 2.1 2.1  --   PHOS 3.7 3.6 3.2  --     GFR: Estimated Creatinine Clearance: 129.9 mL/min (by C-G formula based on  SCr of 1.04 mg/dL). Liver Function Tests: Recent Labs  Lab 11/18/21 0511  AST 9*  ALT 10  ALKPHOS 57  BILITOT 0.6  PROT 6.3*  ALBUMIN 2.7*    No results for input(s): "LIPASE", "AMYLASE" in the last 168 hours. No results for input(s): "AMMONIA" in the last 168 hours. Coagulation Profile: No results for input(s): "INR", "PROTIME" in the last 168 hours.  Cardiac Enzymes: No results for input(s): "CKTOTAL", "CKMB", "CKMBINDEX", "TROPONINI" in the last 168 hours. BNP (last 3 results) No results for input(s): "PROBNP" in the last 8760 hours. HbA1C: No results for input(s): "HGBA1C" in the last 72 hours. CBG: No results for input(s): "GLUCAP" in the last 168 hours.  Lipid Profile: No results for  input(s): "CHOL", "HDL", "LDLCALC", "[place]", "CHOLHDL", "LDLDIRECT" in the last 72 hours. Thyroid Function Tests: No results for input(s): "TSH", "T4TOTAL", "FREET4", "T3FREE", "THYROIDAB" in the last 72 hours. Anemia Panel: Recent Labs    11/22/21 0554 11/22/21 1012  VITAMINB12  --  422  FOLATE 11.9  --   FERRITIN 17*  --   TIBC 333  --   IRON 19*  --   RETICCTPCT 2.3  --     Sepsis Labs: No results for input(s): "PROCALCITON", "LATICACIDVEN" in the last 168 hours.  No results found for this or any previous visit (from the past 240 hour(s)).   Radiology Studies: No results found.  Scheduled Meds:  (feeding supplement) PROSource Plus  30 mL Oral TID PC & HS   arformoterol  15 mcg Nebulization BID   vitamin C  500 mg Oral BID   atorvastatin  10 mg Oral Daily   Chlorhexidine Gluconate Cloth  6 each Topical Daily   cholecalciferol  2,000 Units Oral Daily   escitalopram  5 mg Oral Daily   ferrous [id number] C-folic acid  1 capsule Oral BID PC   free water  30 mL Per Tube Q4H   gabapentin  300 mg Oral Q8H   lidocaine  1 patch Transdermal Daily   melatonin  5 mg Oral QHS   mouth rinse  15 mL Mouth Rinse 4 times per day   pantoprazole (PROTONIX) IV  40 mg Intravenous Q12H   sodium chloride flush  3 mL Intravenous Q12H   Continuous Infusions:  sodium chloride Stopped (10/07/21 0542)   sodium chloride       LOS: 66 days    Time spent: 38 Mins  This record has been created using Systems analyst. Errors have been sought and corrected,but may not always be located. Such creation errors do not reflect on the standard of care.     Lorella Nimrod, MD [facility]   If 7PM-7AM, please contact night-coverage

## 2021-11-25 DIAGNOSIS — J9622 Acute and chronic respiratory failure with hypercapnia: Secondary | ICD-10-CM | POA: Diagnosis not present

## 2021-11-25 DIAGNOSIS — J9621 Acute and chronic respiratory failure with hypoxia: Secondary | ICD-10-CM | POA: Diagnosis not present

## 2021-11-25 MED ORDER — FE FUMARATE-B12-VIT C-FA-IFC PO CAPS
1.0000 | ORAL_CAPSULE | Freq: Two times a day (BID) | ORAL | Status: DC
  Administered 2021-11-25 – 2021-12-13 (×35): 1 via ORAL
  Filled 2021-11-25 (×39): qty 1

## 2021-11-25 NOTE — Progress Notes (Signed)
PROGRESS NOTE    Gary [name]  IEP:[id number] DOB: [birth year] DOA: 09/19/2021 PCP: [facility] [place]   Brief Narrative: This 66 y/o male with multiple medical problems causing chronic hypercapneic respiratory failure who has had admissions in the past for exacerbations of the same and mechanical ventilation presented for evaluation of dyspnea, weakness. It is unclear if the patient has been compliant with his medications and NIMV at home.  He was admitted by the hospitalists for a presumed COPD exacerbation in the ER, treated with bronchodilators and NIMV but his mental status and hypercarbia worsened despite those interventions so he required intubation.   6/6: Presented to ED.  Required intubation and mechanical ventilation in the ED.  PCCM admitted in ICU 6/7: Hold diuresis due to worsening Creatinine.  ENT consulted for Trach placement per family request 6/8: Pt continues to spike temps tmax 101.8 degrees F.  Flexiseal placed overnight draining foul smelling watery stool~Cdiff and GI panel negative 6/9: Fever resolved.  Negative for C- diff, GI panel still pending.  Creatinine slightly worsened, requiring low dose levophed (2 mcg). Holding diuresis 6/10: propofol discontinued due hypertriglyceridemia; failed SBT 6/11: Tracheal aspirate from 6/6 resulting with Pseudomonas, ABX changed to Cefepime. neurologically intact, awaiting Trach placement 6/12: Pt calmer today following addition of oral benzo's, narcotics, and Seroquel via tube.  Creatinine worsened with decreased UOP, holding diuresis, low threshold for Nephrology consult  6/13: Pt with worsening renal failure creatinine 4.15.  UOP overnight 25 ml.  Nephrology consulted plans to start pt on hemodialysis follow dialysis catheter placement Pt underwent hemodialysis without ultrafiltration 06/14: .  Plans for HD session today.  Agitation has improved significantly with narcotics and antianxiety medications per tube   06/16:  Tracheostomy canceled 06/15 due to hyperkalemia and tentatively rescheduled for 06/20  6/18 failure to wean from vent 06/20: ENT placed Tracheostomy (#7.0 XLT cuffed).  Developed mild epistaxis post unsuccessful attempt at NGT placement.  Later developed hematochezia/melena, Protonix gtt started and GI consulted. 06/21: Diuresed yesterday with limited results. Patient had softed blood pressures throughout the day and diuresis was held as a result. HD was performed by the patient clotted off the machine and was not able to be rinsed back. Repeat hemoglobin is above 8. 06/23: Pt remains mechanically intubated settings: FiO2 50%/PEEP 10.  CXR concerning for moderate right pleural effusion with associated right basilar atelectasis vs. infiltrate.  Will perform [place] Chest to determine if pt needs right sided thoracentesis  06/24: Remains on the ventilator, FiO2 requirements decreasing, respiratory culture with abundant gram-negative rods: [name], on Meropenem 06/25: Oxygen requirements down to 40%, chest x-ray shows improvement on dense right consolidation, Pseudomonas susceptibilities pending 06/25: Pt remains on the ventilator settings: PEEP 10 and FiO2 40%; will attempt to wean PEEP to 5 to perform SBT  06/28: Pt tolerated PS on 06/27.  Overnight pt had episodes of vomiting TF's suspect secondary to malposition of nasogastric tube.  Will consult IR for PEG tube placement if unable to place PEG tube pt will need postpyloric Dobbhoff   06/29: Pt tolerated PS 5/5 for over an hour yesterday, however due to fatigue placed back on PRVC.  Pt pulled NGT out several times overnight, therefore will not attempt to reinsert.  Plans for PEG tube placement per IR on 06/30.   06/30: PEG tube placed per IR  07/1: Pt tolerated trach collar _0 % throughout the day 07/2: Pt placed on ventilator overnight settings 16/5/35% tolerating well.  Will place pt on trach  collar today and place orders for PT/OT 7/3:  Hemodynamically stable.  Tolerating TC (has remained on TC since 7/2 @ 08:00), plan for speech therapy and PT/OT 7/4: Has maintained on trach collar for greater than 48 hours, tolerating Passy-Muir valve.  Plan to transfer out of ICU. SLP recs: dysphagia 1 (puree) w/ nectar liquid.  7/5-7/6: sodium and creatinine trending appropriately, Hgb stable. Low K repleted and stable. Was off vent qhs (orders d/c or missed?) and was restarted. Will need LTAC, TOC following  7/7: pulled out trach overnight. Confusion re: vent plan among staff and myself. I asked PCCM to reevluate him and verify plan so the team and the patient can eb on the same page, and will help TOC arrange appropriate placement.  7/8: I spoke personally w/ Dr Milon Dikes [facility]) and he confirms patient does NOT need ventilator support, unless of course develops distress, but last night the patient tolerated well without it. Plan to meet w/ family today when they visit, I spoke w/ his sister yesterday on the phone. No concerns on CBC/BMP: Cr tending down a bit, Hgb stable, Na slightly high but improving over past few days. He is approaching medically stable and may be appropriate for LTAC vs SNF soon  7/9: Several long discussions with patient and family about medical condition, behavioral issues.  See separate progress notes.  Psychiatry evaluated, agree that he has capacity, started SSRI. 7/10: Patient remains medically stable, awaiting placement.  Discussed his behavioral issues again and encouraged him to always bring any concerns to nursing staff or to me calmly and I am happy to listen, requested he maintain professionalism towards staff, he seems amenable. 7/11-7/14: goal OOB to chair, needing hoyer lift, pt reeducated several times about staying in bed 07/11 and has been compliant through 07/14. Finally got bariatric chair and hoyer lift 7/12 and was OOB 07/13.  D/c monitoring and sitter and transferred to [place] status 07/13. Awaiting placement.   7/15-17- working to reorient patient, but personality and coherence remain difficult. Stable for placement. Reminded patient not to verbally abuse staff 7/28 - rising O2 needs past couple of days, due to mucus plugging.  ABG stable, normal mentation.  Improved after suctioning. 8/2: Patient awaiting placement in a long-term acute care setting.. 8/3: Patient had multiple episodes of maroon-colored big bowel movements through out night.  H&H remains stable, vitals stable but patient at high risk for decompensation.  Patient transferred to ICU.  2 unit PRBC given, GI Dr. Lynnell Jude notified,  Nuclear scan positive for active GI bleeding in the proximal colon, vascular surgery Dr. Delana Meyer and ICU Dr. Lanney Gins notified.  Patient underwent embolization of the bleeding artery. 8/4: Patient continues to have bright red blood per rectum.  Patient underwent flex sigmoidoscopy showed blood in the rectum, rectosigmoid colon, diverticulosis.  Hemoglobin dropped to 7.6.  Transfused 2 unit PRBC.  If patient continues to bleed may require general surgery evaluation. 8/5-8/8: Patient remains hemodynamically stable, Patient reports very little blood in the stools.  1 unit PRBC given, Hb 7.9. 8/9: Patient remained stable, no more bleeding per rectum.  Received total of 3 unit of PRBC.  Hemoglobin at 7.6, anemia panel with concern of iron deficiency.  Iron replacement ordered. Patient remained stable.  He was asking if he can go home in the next week or so with home health services.  Per patient he is trying his best to work with PT, they recommend patient remained for SNF. 8/11: Patient remained stable.  Started working  with PT.  He was asking to remove G-tube as he was eating and drinking okay and has not used that tube for a while-message sent to GI.  He was hoping to be able to go home. 8/12: IR placed the PEG tube and waiting to complete 6 weeks which will be done in next few days before removing.  Assessment & Plan:    Principal Problem:   Acute on chronic respiratory failure with hypoxia and hypercapnia ([name]) Active Problems:   Major depressive disorder, recurrent episode, moderate ([name])   Bloody stool   COPD with acute exacerbation ([name])   Acute metabolic encephalopathy   HTN (hypertension)   Acute on chronic diastolic congestive heart failure ([name])   Obesity, Class III, BMI 40-49.9 (morbid obesity) ([place])   Obstructive sleep apnea   Chronic kidney disease, stage 3a ([name])   HLD (hyperlipidemia)   Pressure injury of skin   Hematochezia  Acute GI bleed /acute blood loss anemia: Improved. Nuclear scan positive for active GI bleeding in the proximal colon. Vascular surgery Dr. Delana Meyer has attempted embolization of the bleeding artery. Dr. Allen [name] with GI attempted flex sigmoidoscopy which shows blood in the sigmoid colon, rectosigmoid, diverticulum.  Received total of 3 unit of PRBC Continue Protonix 40 mg IV every 12 hours daily. Anemia panel with iron deficiency. -Received IV iron -Start him on p.o. supplement -Monitor hemoglobin -Transfuse if below 7 -Consult general surgery if continue to bleed   Left foot blood blister - appreciate podiatry evaluation; He underwent removal of blister at bedside by podiatry on 7/21 -  Use prevalon boots to aid with pressure wounds going forward.   Acute on Chronic Hypoxic Hypercapnic Respiratory Failure Acute exacerbation COPD Pseudomonal PNA OSA/OHS - S/p  tracheostomy by ENT by 6/20. - Continue Trach collar w/ ventilator support as needed / qhs but he does NOT need vent support routinely per conversation w/ Dr Milon Dikes 07/08 - Completed 10 day course of ceftazidime for treatment of Pseudomonas. - At this point, he is medically stable but placement is going to be an issue for him.   -  His behavioral issues have been discussed with him and his behavior has improved  - Monitor mental status closely and check blood gas PRN - Has OHS and was reliant on  nightly bipap prior to trach 7/29-8/2: stable on 10 L/min, no reports of significant de-sat episodes.   Bilateral hand and foot pain -  He has hx of gout, treated for a few days with colchicine last week.   Now having diffuse joint pains in hands and feet.   Pt and family  reports hx. of rheumatoid arthritis.   Uric acid level was normal last week ESR 48, CRP 4.2 - nonspecific Rheumatoid factor elevated 58.8 --Monitor for improvement   Acute decompensated HFpEF Hypertension - Echocardiogram 08/07/2021: LVEF 60 to 74%, grade 1 diastolic dysfunction, normal RV systolic function - Was on intermittent hemodialysis for volume removal. - has not needed HD and has remained compensated. Cancelled permacath placement  - resumed home torsemide 7/27   AKI superimposed CKD stage IIIa > Resolved Hypokalemia > Resolved. Hypernatremia > Resolved. 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 Diff negative.   Acute Blood Loss Anemia - resolved  Nasopharyngeal trauma vs Upper GI bleed - Continue BID IV PPI - per GI, no need for scope    Hyperglycemia - CBG's achs; patient refusing  at times - SSI  Acute Metabolic Encephalopathy in setting of CO2 Narcosis Sedation needs in setting of mechanical ventilation - CT Head 09/19/21: negative;  UDS negative - Treatment of hypercapnia as outlined above   Trach in place Gtube in place - No longer requiring vent support - PO intake has improved, SLP and dietary following  -Continue trach collar supplemental O2 to maintain sats >88% - Continues to eat well; G-tube will have to remain in place at least 3 months prior to removal    Morbid obesity: Estimated body mass index is 53.24 kg/m as calculated from the following:   Height as of this encounter: $RemoveBeforeD'6\' 3"'[id number]$  (1.905 m).   Weight as of this encounter: 193.2 kg.  This  complicate the hospital  course. Diet and exercise discussed in detail.   DVT prophylaxis: SCDs Code Status: Full code. Family Communication: Called sister with no response. Disposition Plan:   Status is: Inpatient Remains inpatient appropriate because: Awaiting SNF placement. Barriers; difficult disposition.  Consultants:  PCCU Nephrology ENT GI IR Psychiatry  Procedures:  10/03/21: tracheostomy placement 10/12/21: dialysis permacath placement  10/13/21: G-tube placed    Antimicrobials:  Anti-infectives (From admission, onward)    Start     Dose/Rate Route Frequency Ordered Stop   11/16/21 1651  ceFAZolin (ANCEF) IVPB 1 g/50 mL premix        over 30 Minutes  Continuous PRN 11/16/21 1651 11/16/21 1837   11/16/21 1559  ceFAZolin (ANCEF) IVPB 2g/100 mL premix        2 g 200 mL/hr over 30 Minutes Intravenous 30 min pre-op 11/16/21 1559 11/16/21 1700   10/12/21 1200  cefTAZidime (FORTAZ) 2 g in sodium chloride 0.9 % 100 mL IVPB        2 g 200 mL/hr over 30 Minutes Intravenous 2 times daily 10/12/21 1122 10/17/21 0844   10/12/21 0417  ceFAZolin (ANCEF) IVPB 1 g/50 mL premix  Status:  Discontinued        1 g 100 mL/hr over 30 Minutes Intravenous 30 min pre-op 10/12/21 0417 10/16/21 0751   10/09/21 2000  ceFEPIme (MAXIPIME) 1 g in sodium chloride 0.9 % 100 mL IVPB  Status:  Discontinued        1 g 200 mL/hr over 30 Minutes Intravenous Every 24 hours 10/09/21 1404 10/09/21 1515   10/09/21 2000  cefTAZidime (FORTAZ) 1 g in sodium chloride 0.9 % 100 mL IVPB  Status:  Discontinued        1 g 200 mL/hr over 30 Minutes Intravenous Every 24 hours 10/09/21 1515 10/12/21 1122   10/06/21 1830  meropenem (MERREM) 500 mg in sodium chloride 0.9 % 100 mL IVPB  Status:  Discontinued        500 mg 200 mL/hr over 30 Minutes Intravenous Every 24 hours 10/06/21 1735 10/09/21 1403   10/06/21 1800  doxycycline (VIBRAMYCIN) 100 mg in sodium chloride 0.9 % 250 mL IVPB  Status:  Discontinued        100 mg 125 mL/hr  over 120 Minutes Intravenous Every 12 hours 10/06/21 1657 10/08/21 0838   09/29/21 1000  ceFEPIme (MAXIPIME) 2 g in sodium chloride 0.9 % 100 mL IVPB        2 g 200 mL/hr over 30 Minutes Intravenous Every 12 hours 09/29/21 0812 09/30/21 2342   09/26/21 2200  ceFEPIme (MAXIPIME) 1 g in sodium chloride 0.9 % 100 mL IVPB  Status:  Discontinued        1 g 200 mL/hr over  30 Minutes Intravenous Every 24 hours 09/26/21 1154 09/29/21 0812   09/26/21 2000  ceFEPIme (MAXIPIME) 2 g in sodium chloride 0.9 % 100 mL IVPB  Status:  Discontinued        2 g 200 mL/hr over 30 Minutes Intravenous Every 12 hours 09/26/21 0809 09/26/21 1154   09/24/21 1200  ceFEPIme (MAXIPIME) 2 g in sodium chloride 0.9 % 100 mL IVPB  Status:  Discontinued        2 g 200 mL/hr over 30 Minutes Intravenous Every 8 hours 09/24/21 0950 09/26/21 0809   09/21/21 1430  azithromycin (ZITHROMAX) 500 mg in sodium chloride 0.9 % 250 mL IVPB        500 mg 250 mL/hr over 60 Minutes Intravenous Every 24 hours 09/21/21 1344 09/23/21 1543   09/21/21 1430  cefTRIAXone (ROCEPHIN) 2 g in sodium chloride 0.9 % 100 mL IVPB  Status:  Discontinued        2 g 200 mL/hr over 30 Minutes Intravenous Every 24 hours 09/21/21 1344 09/24/21 0922        Subjective: Patient was resting comfortably when seen today.  No new complaints.  Objective: Vitals:   11/25/21 0300 11/25/21 0425 11/25/21 0810 11/25/21 0820  BP:  119/81 118/68   Pulse:  89 80   Resp:  18 18   Temp:  (!) 97.5 F (36.4 C) 98 F (36.7 C)   TempSrc:  Axillary    SpO2:  93% 98% 94%  Weight: (!) 193.2 kg     Height:        Intake/Output Summary (Last 24 hours) at 11/25/2021 1547 Last data filed at 11/25/2021 1029 Gross per 24 hour  Intake 280 ml  Output 1450 ml  Net -1170 ml     Filed Weights   11/22/21 0500 11/24/21 0500 11/25/21 0300  Weight: (!) 190.5 kg (!) 193.2 kg (!) 193.2 kg    Examination:  General.  Morbidly obese gentleman, in no acute distress.  Trach  collar in place Pulmonary.  Lungs clear bilaterally, normal respiratory effort. CV.  Regular rate and rhythm, no JVD, rub or murmur. Abdomen.  Soft, nontender, nondistended, BS positive. CNS.  Alert and oriented .  No focal neurologic deficit. Extremities.  No edema, no cyanosis, pulses intact and symmetrical. Psychiatry.  Judgment and insight appears normal.   Data Reviewed: I have personally reviewed following labs and imaging studies  CBC: Recent Labs  Lab 11/19/21 0646 11/20/21 1233 11/22/21 0554 11/23/21 0934  WBC 10.4  --  10.8* 10.3  NEUTROABS 7.4  --   --   --   HGB 7.3* 7.9* 7.6* 7.6*  HCT 26.3* 27.7* 28.1* 28.1*  MCV 80.9  --  84.9 85.2  PLT PLATELET CLUMPS NOTED ON SMEAR, UNABLE TO ESTIMATE  --  PLATELET CLUMPS NOTED ON SMEAR, UNABLE TO ESTIMATE PLATELET CLUMPS NOTED ON SMEAR, UNABLE TO ESTIMATE    Basic Metabolic Panel: Recent Labs  Lab 11/19/21 0646 11/22/21 0554 11/23/21 0934  NA 136 141 141  K 4.2 4.4 4.6  CL 100 107 105  CO2 $Re'28 31 31  'AdL$ GLUCOSE 123* 107* 85  BUN 25* 18 16  CREATININE 1.26* 1.05 1.04  CALCIUM 8.0* 8.3* 8.4*  MG 2.1 2.1  --   PHOS 3.6 3.2  --     GFR: Estimated Creatinine Clearance: 129.9 mL/min (by C-G formula based on SCr of 1.04 mg/dL). Liver Function Tests: No results for input(s): "AST", "ALT", "ALKPHOS", "BILITOT", "PROT", "ALBUMIN" in the last 168  hours.  No results for input(s): "LIPASE", "AMYLASE" in the last 168 hours. No results for input(s): "AMMONIA" in the last 168 hours. Coagulation Profile: No results for input(s): "INR", "PROTIME" in the last 168 hours.  Cardiac Enzymes: No results for input(s): "CKTOTAL", "CKMB", "CKMBINDEX", "TROPONINI" in the last 168 hours. BNP (last 3 results) No results for input(s): "PROBNP" in the last 8760 hours. HbA1C: No results for input(s): "HGBA1C" in the last 72 hours. CBG: No results for input(s): "GLUCAP" in the last 168 hours.  Lipid Profile: No results for input(s): "CHOL",  "HDL", "LDLCALC", "Gary", "CHOLHDL", "LDLDIRECT" in the last 72 hours. Thyroid Function Tests: No results for input(s): "TSH", "T4TOTAL", "FREET4", "T3FREE", "THYROIDAB" in the last 72 hours. Anemia Panel: No results for input(s): "VITAMINB12", "FOLATE", "FERRITIN", "TIBC", "IRON", "RETICCTPCT" in the last 72 hours.  Sepsis Labs: No results for input(s): "PROCALCITON", "LATICACIDVEN" in the last 168 hours.  No results found for this or any previous visit (from the past 240 hour(s)).   Radiology Studies: No results found.  Scheduled Meds:  (feeding supplement) PROSource Plus  30 mL Oral TID PC & HS   arformoterol  15 mcg Nebulization BID   vitamin C  500 mg Oral BID   atorvastatin  10 mg Oral Daily   Chlorhexidine Gluconate Cloth  6 each Topical Daily   cholecalciferol  2,000 Units Oral Daily   escitalopram  5 mg Oral Daily   ferrous [id number] C-folic acid  1 capsule Oral BID PC   free water  30 mL Per Tube Q4H   gabapentin  300 mg Oral Q8H   lidocaine  1 patch Transdermal Daily   melatonin  5 mg Oral QHS   mouth rinse  15 mL Mouth Rinse 4 times per day   pantoprazole (PROTONIX) IV  40 mg Intravenous Q12H   sodium chloride flush  3 mL Intravenous Q12H   Continuous Infusions:  sodium chloride Stopped (10/07/21 0542)   sodium chloride       LOS: 67 days    Time spent: 35 Mins  This record has been created using Systems analyst. Errors have been sought and corrected,but may not always be located. Such creation errors do not reflect on the standard of care.     Lorella Nimrod, MD [facility]   If 7PM-7AM, please contact night-coverage

## 2021-11-26 DIAGNOSIS — J9621 Acute and chronic respiratory failure with hypoxia: Secondary | ICD-10-CM | POA: Diagnosis not present

## 2021-11-26 DIAGNOSIS — J9622 Acute and chronic respiratory failure with hypercapnia: Secondary | ICD-10-CM | POA: Diagnosis not present

## 2021-11-26 NOTE — Progress Notes (Signed)
PROGRESS NOTE    Gary [name]  IEP:[id number] DOB: [birth year] DOA: 09/19/2021 PCP: [facility] [place]   Brief Narrative: This 66 y/o male with multiple medical problems causing chronic hypercapneic respiratory failure who has had admissions in the past for exacerbations of the same and mechanical ventilation presented for evaluation of dyspnea, weakness. It is unclear if the patient has been compliant with his medications and NIMV at home.  He was admitted by the hospitalists for a presumed COPD exacerbation in the ER, treated with bronchodilators and NIMV but his mental status and hypercarbia worsened despite those interventions so he required intubation.   6/6: Presented to ED.  Required intubation and mechanical ventilation in the ED.  PCCM admitted in ICU 6/7: Hold diuresis due to worsening Creatinine.  ENT consulted for Trach placement per family request 6/8: Pt continues to spike temps tmax 101.8 degrees F.  Flexiseal placed overnight draining foul smelling watery stool~Cdiff and GI panel negative 6/9: Fever resolved.  Negative for C- diff, GI panel still pending.  Creatinine slightly worsened, requiring low dose levophed (2 mcg). Holding diuresis 6/10: propofol discontinued due hypertriglyceridemia; failed SBT 6/11: Tracheal aspirate from 6/6 resulting with Pseudomonas, ABX changed to Cefepime. neurologically intact, awaiting Trach placement 6/12: Pt calmer today following addition of oral benzo's, narcotics, and Seroquel via tube.  Creatinine worsened with decreased UOP, holding diuresis, low threshold for Nephrology consult  6/13: Pt with worsening renal failure creatinine 4.15.  UOP overnight 25 ml.  Nephrology consulted plans to start pt on hemodialysis follow dialysis catheter placement Pt underwent hemodialysis without ultrafiltration 06/14: .  Plans for HD session today.  Agitation has improved significantly with narcotics and antianxiety medications per tube   06/16:  Tracheostomy canceled 06/15 due to hyperkalemia and tentatively rescheduled for 06/20  6/18 failure to wean from vent 06/20: ENT placed Tracheostomy (#7.0 XLT cuffed).  Developed mild epistaxis post unsuccessful attempt at NGT placement.  Later developed hematochezia/melena, Protonix gtt started and GI consulted. 06/21: Diuresed yesterday with limited results. Patient had softed blood pressures throughout the day and diuresis was held as a result. HD was performed by the patient clotted off the machine and was not able to be rinsed back. Repeat hemoglobin is above 8. 06/23: Pt remains mechanically intubated settings: FiO2 50%/PEEP 10.  CXR concerning for moderate right pleural effusion with associated right basilar atelectasis vs. infiltrate.  Will perform [place] Chest to determine if pt needs right sided thoracentesis  06/24: Remains on the ventilator, FiO2 requirements decreasing, respiratory culture with abundant gram-negative rods: [name], on Meropenem 06/25: Oxygen requirements down to 40%, chest x-ray shows improvement on dense right consolidation, Pseudomonas susceptibilities pending 06/25: Pt remains on the ventilator settings: PEEP 10 and FiO2 40%; will attempt to wean PEEP to 5 to perform SBT  06/28: Pt tolerated PS on 06/27.  Overnight pt had episodes of vomiting TF's suspect secondary to malposition of nasogastric tube.  Will consult IR for PEG tube placement if unable to place PEG tube pt will need postpyloric Dobbhoff   06/29: Pt tolerated PS 5/5 for over an hour yesterday, however due to fatigue placed back on PRVC.  Pt pulled NGT out several times overnight, therefore will not attempt to reinsert.  Plans for PEG tube placement per IR on 06/30.   06/30: PEG tube placed per IR  07/1: Pt tolerated trach collar _0 % throughout the day 07/2: Pt placed on ventilator overnight settings 16/5/35% tolerating well.  Will place pt on trach  collar today and place orders for PT/OT 7/3:  Hemodynamically stable.  Tolerating TC (has remained on TC since 7/2 @ 08:00), plan for speech therapy and PT/OT 7/4: Has maintained on trach collar for greater than 48 hours, tolerating Passy-Muir valve.  Plan to transfer out of ICU. SLP recs: dysphagia 1 (puree) w/ nectar liquid.  7/5-7/6: sodium and creatinine trending appropriately, Hgb stable. Low K repleted and stable. Was off vent qhs (orders d/c or missed?) and was restarted. Will need LTAC, TOC following  7/7: pulled out trach overnight. Confusion re: vent plan among staff and myself. I asked PCCM to reevluate him and verify plan so the team and the patient can eb on the same page, and will help TOC arrange appropriate placement.  7/8: I spoke personally w/ Dr Milon Dikes [facility]) and he confirms patient does NOT need ventilator support, unless of course develops distress, but last night the patient tolerated well without it. Plan to meet w/ family today when they visit, I spoke w/ his sister yesterday on the phone. No concerns on CBC/BMP: Cr tending down a bit, Hgb stable, Na slightly high but improving over past few days. He is approaching medically stable and may be appropriate for LTAC vs SNF soon  7/9: Several long discussions with patient and family about medical condition, behavioral issues.  See separate progress notes.  Psychiatry evaluated, agree that he has capacity, started SSRI. 7/10: Patient remains medically stable, awaiting placement.  Discussed his behavioral issues again and encouraged him to always bring any concerns to nursing staff or to me calmly and I am happy to listen, requested he maintain professionalism towards staff, he seems amenable. 7/11-7/14: goal OOB to chair, needing hoyer lift, pt reeducated several times about staying in bed 07/11 and has been compliant through 07/14. Finally got bariatric chair and hoyer lift 7/12 and was OOB 07/13.  D/c monitoring and sitter and transferred to [place] status 07/13. Awaiting placement.   7/15-17- working to reorient patient, but personality and coherence remain difficult. Stable for placement. Reminded patient not to verbally abuse staff 7/28 - rising O2 needs past couple of days, due to mucus plugging.  ABG stable, normal mentation.  Improved after suctioning. 8/2: Patient awaiting placement in a long-term acute care setting.. 8/3: Patient had multiple episodes of maroon-colored big bowel movements through out night.  H&H remains stable, vitals stable but patient at high risk for decompensation.  Patient transferred to ICU.  2 unit PRBC given, GI Dr. Lynnell Jude notified,  Nuclear scan positive for active GI bleeding in the proximal colon, vascular surgery Dr. Delana Meyer and ICU Dr. Lanney Gins notified.  Patient underwent embolization of the bleeding artery. 8/4: Patient continues to have bright red blood per rectum.  Patient underwent flex sigmoidoscopy showed blood in the rectum, rectosigmoid colon, diverticulosis.  Hemoglobin dropped to 7.6.  Transfused 2 unit PRBC.  If patient continues to bleed may require general surgery evaluation. 8/5-8/8: Patient remains hemodynamically stable, Patient reports very little blood in the stools.  1 unit PRBC given, Hb 7.9. 8/9: Patient remained stable, no more bleeding per rectum.  Received total of 3 unit of PRBC.  Hemoglobin at 7.6, anemia panel with concern of iron deficiency.  Iron replacement ordered. Patient remained stable.  He was asking if he can go home in the next week or so with home health services.  Per patient he is trying his best to work with PT, they recommend patient remained for SNF. 8/11: Patient remained stable.  Started working  with PT.  He was asking to remove G-tube as he was eating and drinking okay and has not used that tube for a while-message sent to GI.  He was hoping to be able to go home. 8/12: IR placed the PEG tube and waiting to complete 6 weeks which will be done in next few days before removing.  Assessment & Plan:    Principal Problem:   Acute on chronic respiratory failure with hypoxia and hypercapnia ([name]) Active Problems:   Major depressive disorder, recurrent episode, moderate ([name])   Bloody stool   COPD with acute exacerbation ([name])   Acute metabolic encephalopathy   HTN (hypertension)   Acute on chronic diastolic congestive heart failure ([name])   Obesity, Class III, BMI 40-49.9 (morbid obesity) ([place])   Obstructive sleep apnea   Chronic kidney disease, stage 3a ([name])   HLD (hyperlipidemia)   Pressure injury of skin   Hematochezia  Acute GI bleed /acute blood loss anemia: Improved. Nuclear scan positive for active GI bleeding in the proximal colon. Vascular surgery Dr. Delana Meyer has attempted embolization of the bleeding artery. Dr. Allen [name] with GI attempted flex sigmoidoscopy which shows blood in the sigmoid colon, rectosigmoid, diverticulum.  Received total of 3 unit of PRBC Continue Protonix 40 mg IV every 12 hours daily. Anemia panel with iron deficiency. -Received IV iron -Start him on p.o. supplement -Monitor hemoglobin -Transfuse if below 7 -Consult general surgery if continue to bleed   Left foot blood blister - appreciate podiatry evaluation; He underwent removal of blister at bedside by podiatry on 7/21 -  Use prevalon boots to aid with pressure wounds going forward.   Acute on Chronic Hypoxic Hypercapnic Respiratory Failure Acute exacerbation COPD Pseudomonal PNA OSA/OHS - S/p  tracheostomy by ENT by 6/20. - Continue Trach collar w/ ventilator support as needed / qhs but he does NOT need vent support routinely per conversation w/ Dr Milon Dikes 07/08 - Completed 10 day course of ceftazidime for treatment of Pseudomonas. - At this point, he is medically stable but placement is going to be an issue for him.   -  His behavioral issues have been discussed with him and his behavior has improved  - Monitor mental status closely and check blood gas PRN - Has OHS and was reliant on  nightly bipap prior to trach 7/29-8/2: stable on 10 L/min, no reports of significant de-sat episodes.   Bilateral hand and foot pain -  He has hx of gout, treated for a few days with colchicine last week.   Now having diffuse joint pains in hands and feet.   Pt and family  reports hx. of rheumatoid arthritis.   Uric acid level was normal last week ESR 48, CRP 4.2 - nonspecific Rheumatoid factor elevated 58.8 --Monitor for improvement   Acute decompensated HFpEF Hypertension - Echocardiogram 08/07/2021: LVEF 60 to 85%, grade 1 diastolic dysfunction, normal RV systolic function - Was on intermittent hemodialysis for volume removal. - has not needed HD and has remained compensated. Cancelled permacath placement  - resumed home torsemide 7/27   AKI superimposed CKD stage IIIa > Resolved Hypokalemia > Resolved. Hypernatremia > Resolved. 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 Diff negative.   Acute Blood Loss Anemia - resolved  Nasopharyngeal trauma vs Upper GI bleed - Continue BID IV PPI - per GI, no need for scope    Hyperglycemia - CBG's achs; patient refusing  at times - SSI  Acute Metabolic Encephalopathy in setting of CO2 Narcosis Sedation needs in setting of mechanical ventilation - CT Head 09/19/21: negative;  UDS negative - Treatment of hypercapnia as outlined above   Trach in place Gtube in place - No longer requiring vent support - PO intake has improved, SLP and dietary following  -Continue trach collar supplemental O2 to maintain sats >88% - Continues to eat well; G-tube will have to remain in place at least 3 months prior to removal    Morbid obesity: Estimated body mass index is 53.24 kg/m as calculated from the following:   Height as of this encounter: $RemoveBeforeD'6\' 3"'[id number]$  (1.905 m).   Weight as of this encounter: 193.2 kg.  This  complicate the hospital  course. Diet and exercise discussed in detail.   DVT prophylaxis: SCDs Code Status: Full code. Family Communication: Called sister with no response. Disposition Plan:   Status is: Inpatient Remains inpatient appropriate because: Awaiting SNF placement. Barriers; difficult disposition.  Consultants:  PCCU Nephrology ENT GI IR Psychiatry  Procedures:  10/03/21: tracheostomy placement 10/12/21: dialysis permacath placement  10/13/21: G-tube placed    Antimicrobials:  Anti-infectives (From admission, onward)    Start     Dose/Rate Route Frequency Ordered Stop   11/16/21 1651  ceFAZolin (ANCEF) IVPB 1 g/50 mL premix        over 30 Minutes  Continuous PRN 11/16/21 1651 11/16/21 1837   11/16/21 1559  ceFAZolin (ANCEF) IVPB 2g/100 mL premix        2 g 200 mL/hr over 30 Minutes Intravenous 30 min pre-op 11/16/21 1559 11/16/21 1700   10/12/21 1200  cefTAZidime (FORTAZ) 2 g in sodium chloride 0.9 % 100 mL IVPB        2 g 200 mL/hr over 30 Minutes Intravenous 2 times daily 10/12/21 1122 10/17/21 0844   10/12/21 0417  ceFAZolin (ANCEF) IVPB 1 g/50 mL premix  Status:  Discontinued        1 g 100 mL/hr over 30 Minutes Intravenous 30 min pre-op 10/12/21 0417 10/16/21 0751   10/09/21 2000  ceFEPIme (MAXIPIME) 1 g in sodium chloride 0.9 % 100 mL IVPB  Status:  Discontinued        1 g 200 mL/hr over 30 Minutes Intravenous Every 24 hours 10/09/21 1404 10/09/21 1515   10/09/21 2000  cefTAZidime (FORTAZ) 1 g in sodium chloride 0.9 % 100 mL IVPB  Status:  Discontinued        1 g 200 mL/hr over 30 Minutes Intravenous Every 24 hours 10/09/21 1515 10/12/21 1122   10/06/21 1830  meropenem (MERREM) 500 mg in sodium chloride 0.9 % 100 mL IVPB  Status:  Discontinued        500 mg 200 mL/hr over 30 Minutes Intravenous Every 24 hours 10/06/21 1735 10/09/21 1403   10/06/21 1800  doxycycline (VIBRAMYCIN) 100 mg in sodium chloride 0.9 % 250 mL IVPB  Status:  Discontinued        100 mg 125 mL/hr  over 120 Minutes Intravenous Every 12 hours 10/06/21 1657 10/08/21 0838   09/29/21 1000  ceFEPIme (MAXIPIME) 2 g in sodium chloride 0.9 % 100 mL IVPB        2 g 200 mL/hr over 30 Minutes Intravenous Every 12 hours 09/29/21 0812 09/30/21 2342   09/26/21 2200  ceFEPIme (MAXIPIME) 1 g in sodium chloride 0.9 % 100 mL IVPB  Status:  Discontinued        1 g 200 mL/hr over  30 Minutes Intravenous Every 24 hours 09/26/21 1154 09/29/21 0812   09/26/21 2000  ceFEPIme (MAXIPIME) 2 g in sodium chloride 0.9 % 100 mL IVPB  Status:  Discontinued        2 g 200 mL/hr over 30 Minutes Intravenous Every 12 hours 09/26/21 0809 09/26/21 1154   09/24/21 1200  ceFEPIme (MAXIPIME) 2 g in sodium chloride 0.9 % 100 mL IVPB  Status:  Discontinued        2 g 200 mL/hr over 30 Minutes Intravenous Every 8 hours 09/24/21 0950 09/26/21 0809   09/21/21 1430  azithromycin (ZITHROMAX) 500 mg in sodium chloride 0.9 % 250 mL IVPB        500 mg 250 mL/hr over 60 Minutes Intravenous Every 24 hours 09/21/21 1344 09/23/21 1543   09/21/21 1430  cefTRIAXone (ROCEPHIN) 2 g in sodium chloride 0.9 % 100 mL IVPB  Status:  Discontinued        2 g 200 mL/hr over 30 Minutes Intravenous Every 24 hours 09/21/21 1344 09/24/21 0922        Subjective: Patient was sleeping comfortably during morning rounds.  Easily arousable but went back to sleep.  Denies any complaints  Objective: Vitals:   11/25/21 1629 11/25/21 2138 11/26/21 0427 11/26/21 1508  BP: 125/67 118/72 130/72 104/71  Pulse: 85 86 85 88  Resp: 19  18 (!) 22  Temp: 98.1 F (36.7 C) 99.6 F (37.6 C) 98.4 F (36.9 C) 98.4 F (36.9 C)  TempSrc:  Axillary Axillary Oral  SpO2: 93% 94% 95% 97%  Weight:      Height:        Intake/Output Summary (Last 24 hours) at 11/26/2021 1646 Last data filed at 11/25/2021 2225 Gross per 24 hour  Intake --  Output 600 ml  Net -600 ml     Filed Weights   11/22/21 0500 11/24/21 0500 11/25/21 0300  Weight: (!) 190.5 kg (!) 193.2 kg  (!) 193.2 kg    Examination:  General.  Morbidly obese gentleman, in no acute distress.  Trach collar in place Pulmonary.  Lungs clear bilaterally, normal respiratory effort. CV.  Regular rate and rhythm, no JVD, rub or murmur. Abdomen.  Soft, nontender, nondistended, BS positive. CNS.  Somnolent  extremities.  No edema, no cyanosis, pulses intact and symmetrical. Psychiatry.  Judgment and insight appears normal.   Data Reviewed: I have personally reviewed following labs and imaging studies  CBC: Recent Labs  Lab 11/20/21 1233 11/22/21 0554 11/23/21 0934  WBC  --  10.8* 10.3  HGB 7.9* 7.6* 7.6*  HCT 27.7* 28.1* 28.1*  MCV  --  84.9 85.2  PLT  --  PLATELET CLUMPS NOTED ON SMEAR, UNABLE TO ESTIMATE PLATELET CLUMPS NOTED ON SMEAR, UNABLE TO ESTIMATE    Basic Metabolic Panel: Recent Labs  Lab 11/22/21 0554 11/23/21 0934  NA 141 141  K 4.4 4.6  CL 107 105  CO2 31 31  GLUCOSE 107* 85  BUN 18 16  CREATININE 1.05 1.04  CALCIUM 8.3* 8.4*  MG 2.1  --   PHOS 3.2  --     GFR: Estimated Creatinine Clearance: 129.9 mL/min (by C-G formula based on SCr of 1.04 mg/dL). Liver Function Tests: No results for input(s): "AST", "ALT", "ALKPHOS", "BILITOT", "PROT", "ALBUMIN" in the last 168 hours.  No results for input(s): "LIPASE", "AMYLASE" in the last 168 hours. No results for input(s): "AMMONIA" in the last 168 hours. Coagulation Profile: No results for input(s): "INR", "PROTIME" in the last  168 hours.  Cardiac Enzymes: No results for input(s): "CKTOTAL", "CKMB", "CKMBINDEX", "TROPONINI" in the last 168 hours. BNP (last 3 results) No results for input(s): "PROBNP" in the last 8760 hours. HbA1C: No results for input(s): "HGBA1C" in the last 72 hours. CBG: No results for input(s): "GLUCAP" in the last 168 hours.  Lipid Profile: No results for input(s): "CHOL", "HDL", "LDLCALC", "Gary", "CHOLHDL", "LDLDIRECT" in the last 72 hours. Thyroid Function Tests: No results for  input(s): "TSH", "T4TOTAL", "FREET4", "T3FREE", "THYROIDAB" in the last 72 hours. Anemia Panel: No results for input(s): "VITAMINB12", "FOLATE", "FERRITIN", "TIBC", "IRON", "RETICCTPCT" in the last 72 hours.  Sepsis Labs: No results for input(s): "PROCALCITON", "LATICACIDVEN" in the last 168 hours.  No results found for this or any previous visit (from the past 240 hour(s)).   Radiology Studies: No results found.  Scheduled Meds:  (feeding supplement) PROSource Plus  30 mL Oral TID PC & HS   arformoterol  15 mcg Nebulization BID   vitamin C  500 mg Oral BID   atorvastatin  10 mg Oral Daily   Chlorhexidine Gluconate Cloth  6 each Topical Daily   cholecalciferol  2,000 Units Oral Daily   escitalopram  5 mg Oral Daily   ferrous [id number] C-folic acid  1 capsule Oral BID PC   free water  30 mL Per Tube Q4H   gabapentin  300 mg Oral Q8H   lidocaine  1 patch Transdermal Daily   melatonin  5 mg Oral QHS   mouth rinse  15 mL Mouth Rinse 4 times per day   pantoprazole (PROTONIX) IV  40 mg Intravenous Q12H   sodium chloride flush  3 mL Intravenous Q12H   Continuous Infusions:  sodium chloride Stopped (10/07/21 0542)   sodium chloride       LOS: 68 days    Time spent: 36 Mins  This record has been created using Systems analyst. Errors have been sought and corrected,but may not always be located. Such creation errors do not reflect on the standard of care.     Lorella Nimrod, MD [facility]   If 7PM-7AM, please contact night-coverage

## 2021-11-27 ENCOUNTER — Inpatient Hospital Stay: Payer: Medicaid Other | Admitting: Radiology

## 2021-11-27 DIAGNOSIS — J9621 Acute and chronic respiratory failure with hypoxia: Secondary | ICD-10-CM | POA: Diagnosis not present

## 2021-11-27 DIAGNOSIS — J9622 Acute and chronic respiratory failure with hypercapnia: Secondary | ICD-10-CM | POA: Diagnosis not present

## 2021-11-27 HISTORY — PX: IR GASTROSTOMY TUBE REMOVAL: IMG5492

## 2021-11-27 MED ORDER — TRAMADOL HCL 50 MG PO TABS
100.0000 mg | ORAL_TABLET | Freq: Two times a day (BID) | ORAL | Status: DC | PRN
  Administered 2021-11-27 – 2021-12-12 (×21): 100 mg via ORAL
  Filled 2021-11-27 (×23): qty 2

## 2021-11-27 NOTE — TOC Progression Note (Signed)
Transition of Care [facility]) - Progression Note    Patient Details  Name: Gary [name] MRN: [id number] Date of Birth: [birth year]  Transition of Care [facility]) CM/SW Contact  Beverly Sessions, RN Phone Number: 11/27/2021, 4:43 PM  Clinical Narrative:     Met with patient at bedside.  He confirms that he wants to go home at discharge  -Fayne Norrie with [facility] PDN  - [phone] - Herbie Baltimore confirms that patient will have a live in caregiver.  He states the [employer] office can likely staff 40 hours a week.  Rob to [employer] me the orders for MD to sign.  He states that [place] can take up to 14 days,  once approved they should be able do same day start of care  - Patient aware that he will have to complete trach education.  Patient states that he lives with a girlfriend.  Will follow up and confirm.  Patient is aware that his girlfriend and whoever his "live in caregiver" is will have to have trach education.  -Patient states that he has a WC, CPAP, RW, bed, O2 and BSC in the home  TOC to call g/f and family tomorrow to confirm who is going to be live in caregiver, and attempt to arrange trach teaching session   Expected Discharge Plan: Long Term Acute Care (LTAC) Barriers to Discharge: Continued Medical Work up  Expected Discharge Plan and Services Expected Discharge Plan: Long Term Acute Care (LTAC)   Discharge Planning Services: CM Consult Post Acute Care Choice: Long Term Acute Care (LTAC) Living arrangements for the past 2 months: Single Family Home                 DME Arranged: N/A DME Agency: NA       HH Arranged: NA HH Agency: NA         Social Determinants of Health (SDOH) Interventions    Readmission Risk Interventions    05/04/2021    1:26 PM 04/07/2021    1:39 PM 03/12/2021   11:54 AM  Readmission Risk Prevention Plan  Transportation Screening Complete Complete Complete  PCP or Specialist Appt within 3-5 Days  Complete Complete  HRI or Home Care Consult    Complete  Social Work Consult for [place] Planning/Counseling   Complete  Palliative Care Screening  Not Applicable Not Applicable  Medication Review [employer]) Complete Complete Complete  PCP or Specialist appointment within 3-5 days of discharge Complete    HRI or [place] Complete    SW Recovery Care/Counseling Consult Complete    [place] Not Applicable

## 2021-11-27 NOTE — Progress Notes (Signed)
Physical Therapy Treatment Patient Details Name: Gary [name] MRN: [id number] DOB: 04/12/1956 Today's Date: 11/27/2021   History of Present Illness Pt is a 66 y/o male admitted mid June secondary to SOB, COPD exacerbation, acute metabolic encephalopathy, acute on chronic hypoxic and hypercapnic respiratory failure in setting of acute decompensated HFpEF and AECOPD requiring intubation and mechanical ventilation. The pt is now s/p tracheostomy and PEG tube placement. New medical complications including GI bleed and multiple patient refusuals.    PT Comments    Pt decline all but below supine therex this session secondary to fatigue and bilateral foot pain, nursing notified.  Pt put forth fair effort during the session with education provided on physiological benefits of getting up to standing and spending time in chair.  Pt will benefit from HHPT services upon discharge for family training on safe pt handling/use of equipment for pt/family safety and decreased caregiver assistance.   Recommendations for follow up therapy are one component of a multi-disciplinary discharge planning process, led by the attending physician.  Recommendations may be updated based on patient status, additional functional criteria and insurance authorization.  Follow Up Recommendations  Home health PT Can patient physically be transported by private vehicle: No   Assistance Recommended at Discharge Frequent or constant Supervision/Assistance  Patient can return home with the following Two people to help with walking and/or transfers;Two people to help with bathing/dressing/bathroom;Assistance with cooking/housework;Assist for transportation;Help with stairs or ramp for entrance   Equipment Recommendations  None recommended by PT    Recommendations for Other Services       Precautions / Restrictions Precautions Precautions: Fall Precaution Comments: IJ, G tube, trach Restrictions Weight Bearing Restrictions:  No Other Position/Activity Restrictions: Paring of blood blister on LLE 11/03/21; Per Dr Luana Shu no weight bearing restrictions on LLE     Mobility  Bed Mobility               General bed mobility comments: Pt declined all but supine therex secondary to fatigue and bilateral foot pain, nsg notified    Transfers                        Ambulation/Gait                   Stairs             Wheelchair Mobility    Modified Rankin (Stroke Patients Only)       Balance                                            Cognition Arousal/Alertness: Awake/alert Behavior During Therapy: WFL for tasks assessed/performed Overall Cognitive Status: Within Functional Limits for tasks assessed                                          Exercises Total Joint Exercises Ankle Circles/Pumps: AROM, AAROM, Both, 15 reps, 20 reps Quad Sets: AROM, AAROM, Both, 15 reps, 20 reps Gluteal Sets: Strengthening, Both, 10 reps, 15 reps Short Arc Quad: AROM, AAROM, Both, 10 reps, 15 reps Hip ABduction/ADduction: AAROM, Strengthening, Both, 10 reps Other Exercises Other Exercises: HEP review for BLE APs, QS, and GS    General Comments  Pertinent Vitals/Pain Pain Assessment Pain Assessment: 0-10 Pain Score: 10-Worst pain ever Pain Location: Bil feet Pain Descriptors / Indicators: Constant, Burning Pain Intervention(s): Repositioned    Home Living                          Prior Function            PT Goals (current goals can now be found in the care plan section) Progress towards PT goals: PT to reassess next treatment    Frequency    Min 2X/week      PT Plan Current plan remains appropriate    Co-evaluation              AM-PAC PT "6 Clicks" Mobility   Outcome Measure  Help needed turning from your back to your side while in a flat bed without using bedrails?: A Lot Help needed moving from lying on  your back to sitting on the side of a flat bed without using bedrails?: A Lot Help needed moving to and from a bed to a chair (including a wheelchair)?: Total Help needed standing up from a chair using your arms (e.g., wheelchair or bedside chair)?: A Lot Help needed to walk in hospital room?: Total Help needed climbing 3-5 steps with a railing? : Total 6 Click Score: 9    End of Session Equipment Utilized During Treatment: Oxygen Activity Tolerance: Patient tolerated treatment well Patient left: in bed;with call bell/phone within reach Nurse Communication: Mobility status;Patient requests pain meds PT Visit Diagnosis: Unsteadiness on feet (R26.81);Muscle weakness (generalized) (M62.81);Difficulty in walking, not elsewhere classified (R26.2)     Time: 8110-3159 PT Time Calculation (min) (ACUTE ONLY): 19 min  Charges:  $Therapeutic Exercise: 8-22 mins                    D. Royetta Asal PT, DPT 11/27/21, 4:08 PM

## 2021-11-27 NOTE — Progress Notes (Signed)
Successful bedside removal of intact push in gastrostomy tube. No immediate post procedural complications. Gauze dressing placed over site.  EBL < 5 ml    Post-removal instructions: 1- No carbonation x 24 hours post-removal. 2- Eat small meals for 24 hours post-removal, recommend leaning back to digest. 3- Ok to shower 24 hours post-removal. 4- No submerging (swimming, bathing) for 7 days post-removal. 5 - Change dressings prn as need   Patient verbalized understanding

## 2021-11-27 NOTE — TOC Progression Note (Signed)
Transition of Care [facility]) - Progression Note    Patient Details  Name: Gary [name] MRN: [id number] Date of Birth: April 25, 1955  [facility] CM/SW [place], LCSW Phone Number: 11/27/2021, 9:05 AM  Clinical Narrative:  LATE ENTRY.   Received call back from [place] with [facility] PDN on Friday 8/11. Patient has benefit for private duty nursing. He will not know about the hours until today 8/14 and will call TOC coworker to update about that. Called sister [name] on Friday to provide update. She understands that someone will need to move in with him or he will have to move in with someone and will discuss with the family. Explained that all family will need to do trach teaching. Sister expressed understanding.   Expected Discharge Plan: Long Term Acute Care (LTAC) Barriers to Discharge: Continued Medical Work up  Expected Discharge Plan and Services Expected Discharge Plan: Long Term Acute Care (LTAC)   Discharge Planning Services: CM Consult Post Acute Care Choice: Long Term Acute Care (LTAC) Living arrangements for the past 2 months: Single Family Home                 DME Arranged: N/A DME Agency: NA       HH Arranged: NA HH Agency: NA         Social Determinants of Health (SDOH) Interventions    Readmission Risk Interventions    05/04/2021    1:26 PM 04/07/2021    1:39 PM 03/12/2021   11:54 AM  Readmission Risk Prevention Plan  Transportation Screening Complete Complete Complete  PCP or Specialist Appt within 3-5 Days  Complete Complete  HRI or [place]   Complete  Social Work Consult for [place] Planning/Counseling   Complete  Palliative Care Screening  Not Applicable Not Applicable  Medication Review Press photographer) Complete Complete Complete  PCP or Specialist appointment within 3-5 days of discharge Complete    HRI or [place] Complete    SW Recovery Care/Counseling Consult Complete    [place] Not Applicable

## 2021-11-27 NOTE — Progress Notes (Signed)
PROGRESS NOTE    Gary [name]  IEP:[id number] DOB: [birth year] DOA: 09/19/2021 PCP: [facility] [place]   Brief Narrative: This 66 y/o male with multiple medical problems causing chronic hypercapneic respiratory failure who has had admissions in the past for exacerbations of the same and mechanical ventilation presented for evaluation of dyspnea, weakness. It is unclear if the patient has been compliant with his medications and NIMV at home.  He was admitted by the hospitalists for a presumed COPD exacerbation in the ER, treated with bronchodilators and NIMV but his mental status and hypercarbia worsened despite those interventions so he required intubation.   6/6: Presented to ED.  Required intubation and mechanical ventilation in the ED.  PCCM admitted in ICU 6/7: Hold diuresis due to worsening Creatinine.  ENT consulted for Trach placement per family request 6/8: Pt continues to spike temps tmax 101.8 degrees F.  Flexiseal placed overnight draining foul smelling watery stool~Cdiff and GI panel negative 6/9: Fever resolved.  Negative for C- diff, GI panel still pending.  Creatinine slightly worsened, requiring low dose levophed (2 mcg). Holding diuresis 6/10: propofol discontinued due hypertriglyceridemia; failed SBT 6/11: Tracheal aspirate from 6/6 resulting with Pseudomonas, ABX changed to Cefepime. neurologically intact, awaiting Trach placement 6/12: Pt calmer today following addition of oral benzo's, narcotics, and Seroquel via tube.  Creatinine worsened with decreased UOP, holding diuresis, low threshold for Nephrology consult  6/13: Pt with worsening renal failure creatinine 4.15.  UOP overnight 25 ml.  Nephrology consulted plans to start pt on hemodialysis follow dialysis catheter placement Pt underwent hemodialysis without ultrafiltration 06/14: .  Plans for HD session today.  Agitation has improved significantly with narcotics and antianxiety medications per tube   06/16:  Tracheostomy canceled 06/15 due to hyperkalemia and tentatively rescheduled for 06/20  6/18 failure to wean from vent 06/20: ENT placed Tracheostomy (#7.0 XLT cuffed).  Developed mild epistaxis post unsuccessful attempt at NGT placement.  Later developed hematochezia/melena, Protonix gtt started and GI consulted. 06/21: Diuresed yesterday with limited results. Patient had softed blood pressures throughout the day and diuresis was held as a result. HD was performed by the patient clotted off the machine and was not able to be rinsed back. Repeat hemoglobin is above 8. 06/23: Pt remains mechanically intubated settings: FiO2 50%/PEEP 10.  CXR concerning for moderate right pleural effusion with associated right basilar atelectasis vs. infiltrate.  Will perform [place] Chest to determine if pt needs right sided thoracentesis  06/24: Remains on the ventilator, FiO2 requirements decreasing, respiratory culture with abundant gram-negative rods: [name], on Meropenem 06/25: Oxygen requirements down to 40%, chest x-ray shows improvement on dense right consolidation, Pseudomonas susceptibilities pending 06/25: Pt remains on the ventilator settings: PEEP 10 and FiO2 40%; will attempt to wean PEEP to 5 to perform SBT  06/28: Pt tolerated PS on 06/27.  Overnight pt had episodes of vomiting TF's suspect secondary to malposition of nasogastric tube.  Will consult IR for PEG tube placement if unable to place PEG tube pt will need postpyloric Dobbhoff   06/29: Pt tolerated PS 5/5 for over an hour yesterday, however due to fatigue placed back on PRVC.  Pt pulled NGT out several times overnight, therefore will not attempt to reinsert.  Plans for PEG tube placement per IR on 06/30.   06/30: PEG tube placed per IR  07/1: Pt tolerated trach collar _0 % throughout the day 07/2: Pt placed on ventilator overnight settings 16/5/35% tolerating well.  Will place pt on trach  collar today and place orders for PT/OT 7/3:  Hemodynamically stable.  Tolerating TC (has remained on TC since 7/2 @ 08:00), plan for speech therapy and PT/OT 7/4: Has maintained on trach collar for greater than 48 hours, tolerating Passy-Muir valve.  Plan to transfer out of ICU. SLP recs: dysphagia 1 (puree) w/ nectar liquid.  7/5-7/6: sodium and creatinine trending appropriately, Hgb stable. Low K repleted and stable. Was off vent qhs (orders d/c or missed?) and was restarted. Will need LTAC, TOC following  7/7: pulled out trach overnight. Confusion re: vent plan among staff and myself. I asked PCCM to reevluate him and verify plan so the team and the patient can eb on the same page, and will help TOC arrange appropriate placement.  7/8: I spoke personally w/ Dr Milon Dikes [facility]) and he confirms patient does NOT need ventilator support, unless of course develops distress, but last night the patient tolerated well without it. Plan to meet w/ family today when they visit, I spoke w/ his sister yesterday on the phone. No concerns on CBC/BMP: Cr tending down a bit, Hgb stable, Na slightly high but improving over past few days. He is approaching medically stable and may be appropriate for LTAC vs SNF soon  7/9: Several long discussions with patient and family about medical condition, behavioral issues.  See separate progress notes.  Psychiatry evaluated, agree that he has capacity, started SSRI. 7/10: Patient remains medically stable, awaiting placement.  Discussed his behavioral issues again and encouraged him to always bring any concerns to nursing staff or to me calmly and I am happy to listen, requested he maintain professionalism towards staff, he seems amenable. 7/11-7/14: goal OOB to chair, needing hoyer lift, pt reeducated several times about staying in bed 07/11 and has been compliant through 07/14. Finally got bariatric chair and hoyer lift 7/12 and was OOB 07/13.  D/c monitoring and sitter and transferred to [place] status 07/13. Awaiting placement.   7/15-17- working to reorient patient, but personality and coherence remain difficult. Stable for placement. Reminded patient not to verbally abuse staff 7/28 - rising O2 needs past couple of days, due to mucus plugging.  ABG stable, normal mentation.  Improved after suctioning. 8/2: Patient awaiting placement in a long-term acute care setting.. 8/3: Patient had multiple episodes of maroon-colored big bowel movements through out night.  H&H remains stable, vitals stable but patient at high risk for decompensation.  Patient transferred to ICU.  2 unit PRBC given, GI Dr. Lynnell Jude notified,  Nuclear scan positive for active GI bleeding in the proximal colon, vascular surgery Dr. Delana Meyer and ICU Dr. Lanney Gins notified.  Patient underwent embolization of the bleeding artery. 8/4: Patient continues to have bright red blood per rectum.  Patient underwent flex sigmoidoscopy showed blood in the rectum, rectosigmoid colon, diverticulosis.  Hemoglobin dropped to 7.6.  Transfused 2 unit PRBC.  If patient continues to bleed may require general surgery evaluation. 8/5-8/8: Patient remains hemodynamically stable, Patient reports very little blood in the stools.  1 unit PRBC given, Hb 7.9. 8/9: Patient remained stable, no more bleeding per rectum.  Received total of 3 unit of PRBC.  Hemoglobin at 7.6, anemia panel with concern of iron deficiency.  Iron replacement ordered. Patient remained stable.  He was asking if he can go home in the next week or so with home health services.  Per patient he is trying his best to work with PT, they recommend patient remained for SNF. 8/11: Patient remained stable.  Started working  with PT.  He was asking to remove G-tube as he was eating and drinking okay and has not used that tube for a while-message sent to GI.  He was hoping to be able to go home. 8/12: IR placed the PEG tube and waiting to complete 6 weeks which will be done in next few days before removing. 8/14: PEG tube was  removed by IR today.  Please see postprocedure instructions. Tramadol was added at his request for better control of pain.  Assessment & Plan:   Principal Problem:   Acute on chronic respiratory failure with hypoxia and hypercapnia ([name]) Active Problems:   Major depressive disorder, recurrent episode, moderate ([name])   Bloody stool   COPD with acute exacerbation ([name])   Acute metabolic encephalopathy   HTN (hypertension)   Acute on chronic diastolic congestive heart failure ([name])   Obesity, Class III, BMI 40-49.9 (morbid obesity) ([place])   Obstructive sleep apnea   Chronic kidney disease, stage 3a ([name])   HLD (hyperlipidemia)   Pressure injury of skin   Hematochezia  Acute GI bleed /acute blood loss anemia: Improved. Nuclear scan positive for active GI bleeding in the proximal colon. Vascular surgery Dr. Delana Meyer has attempted embolization of the bleeding artery. Dr. Allen [name] with GI attempted flex sigmoidoscopy which shows blood in the sigmoid colon, rectosigmoid, diverticulum.  Received total of 3 unit of PRBC Continue Protonix 40 mg IV every 12 hours daily. Anemia panel with iron deficiency. -Received IV iron -Start him on p.o. supplement -Monitor hemoglobin -Transfuse if below 7 -Consult general surgery if continue to bleed   Left foot blood blister - appreciate podiatry evaluation; He underwent removal of blister at bedside by podiatry on 7/21 -  Use prevalon boots to aid with pressure wounds going forward.   Acute on Chronic Hypoxic Hypercapnic Respiratory Failure Acute exacerbation COPD Pseudomonal PNA OSA/OHS - S/p  tracheostomy by ENT by 6/20. - Continue Trach collar w/ ventilator support as needed / qhs but he does NOT need vent support routinely per conversation w/ Dr Milon Dikes 07/08 - Completed 10 day course of ceftazidime for treatment of Pseudomonas. - At this point, he is medically stable but placement is going to be an issue for him.   -  His behavioral issues  have been discussed with him and his behavior has improved  - Monitor mental status closely and check blood gas PRN - Has OHS and was reliant on nightly bipap prior to trach 7/29-8/2: stable on 10 L/min, no reports of significant de-sat episodes. 8/14.Currently stable on 5 L of oxygen with trach collar.   Bilateral hand and foot pain -  He has hx of gout, treated for a few days with colchicine last week.   Now having diffuse joint pains in hands and feet.   Pt and family  reports hx. of rheumatoid arthritis.   Uric acid level was normal last week ESR 48, CRP 4.2 - nonspecific Rheumatoid factor elevated 58.8 --Monitor for improvement   Acute decompensated HFpEF Hypertension - Echocardiogram 08/07/2021: LVEF 60 to 25%, grade 1 diastolic dysfunction, normal RV systolic function - Was on intermittent hemodialysis for volume removal. - has not needed HD and has remained compensated. Cancelled permacath placement  - resumed home torsemide 7/27   AKI superimposed CKD stage IIIa > Resolved Hypokalemia > Resolved. Hypernatremia > Resolved. 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 Diff negative.  Acute Blood Loss Anemia - resolved  Nasopharyngeal trauma vs Upper GI bleed - Continue BID IV PPI - per GI, no need for scope    Hyperglycemia - CBG's achs; patient refusing at times - SSI  Acute Metabolic Encephalopathy in setting of CO2 Narcosis Sedation needs in setting of mechanical ventilation - CT Head 09/19/21: negative;  UDS negative - Treatment of hypercapnia as outlined above   Trach in place G-tube removed today-11/27/2021 - No longer requiring vent support - PO intake has improved, SLP and dietary following  -Continue trach collar supplemental O2 to maintain sats >88% - Continues to eat well;     Morbid obesity: Estimated body mass index is 53.24 kg/m as calculated  from the following:   Height as of this encounter: 6' 3" (1.905 m).   Weight as of this encounter: 193.2 kg.  This  complicate the hospital course. Diet and exercise discussed in detail.   DVT prophylaxis: SCDs Code Status: Full code. Family Communication: Called sister with no response. Disposition Plan:   Status is: Inpatient Remains inpatient appropriate because: Awaiting SNF placement. Barriers; difficult disposition.  Consultants:  PCCU Nephrology ENT GI IR Psychiatry  Procedures:  10/03/21: tracheostomy placement 10/12/21: dialysis permacath placement  10/13/21: G-tube placed 11/27/2021: G-tube removed    Antimicrobials:  Anti-infectives (From admission, onward)    Start     Dose/Rate Route Frequency Ordered Stop   11/16/21 1651  ceFAZolin (ANCEF) IVPB 1 g/50 mL premix        over 30 Minutes  Continuous PRN 11/16/21 1651 11/16/21 1837   11/16/21 1559  ceFAZolin (ANCEF) IVPB 2g/100 mL premix        2 g 200 mL/hr over 30 Minutes Intravenous 30 min pre-op 11/16/21 1559 11/16/21 1700   10/12/21 1200  cefTAZidime (FORTAZ) 2 g in sodium chloride 0.9 % 100 mL IVPB        2 g 200 mL/hr over 30 Minutes Intravenous 2 times daily 10/12/21 1122 10/17/21 0844   10/12/21 0417  ceFAZolin (ANCEF) IVPB 1 g/50 mL premix  Status:  Discontinued        1 g 100 mL/hr over 30 Minutes Intravenous 30 min pre-op 10/12/21 0417 10/16/21 0751   10/09/21 2000  ceFEPIme (MAXIPIME) 1 g in sodium chloride 0.9 % 100 mL IVPB  Status:  Discontinued        1 g 200 mL/hr over 30 Minutes Intravenous Every 24 hours 10/09/21 1404 10/09/21 1515   10/09/21 2000  cefTAZidime (FORTAZ) 1 g in sodium chloride 0.9 % 100 mL IVPB  Status:  Discontinued        1 g 200 mL/hr over 30 Minutes Intravenous Every 24 hours 10/09/21 1515 10/12/21 1122   10/06/21 1830  meropenem (MERREM) 500 mg in sodium chloride 0.9 % 100 mL IVPB  Status:  Discontinued        500 mg 200 mL/hr over 30 Minutes Intravenous Every 24  hours 10/06/21 1735 10/09/21 1403   10/06/21 1800  doxycycline (VIBRAMYCIN) 100 mg in sodium chloride 0.9 % 250 mL IVPB  Status:  Discontinued        100 mg 125 mL/hr over 120 Minutes Intravenous Every 12 hours 10/06/21 1657 10/08/21 0838   09/29/21 1000  ceFEPIme (MAXIPIME) 2 g in sodium chloride 0.9 % 100 mL IVPB        2 g 200 mL/hr over 30 Minutes Intravenous Every 12 hours 09/29/21 0812 09/30/21 2342   09/26/21 2200  ceFEPIme (MAXIPIME) 1 g  in sodium chloride 0.9 % 100 mL IVPB  Status:  Discontinued        1 g 200 mL/hr over 30 Minutes Intravenous Every 24 hours 09/26/21 1154 09/29/21 0812   09/26/21 2000  ceFEPIme (MAXIPIME) 2 g in sodium chloride 0.9 % 100 mL IVPB  Status:  Discontinued        2 g 200 mL/hr over 30 Minutes Intravenous Every 12 hours 09/26/21 0809 09/26/21 1154   09/24/21 1200  ceFEPIme (MAXIPIME) 2 g in sodium chloride 0.9 % 100 mL IVPB  Status:  Discontinued        2 g 200 mL/hr over 30 Minutes Intravenous Every 8 hours 09/24/21 0950 09/26/21 0809   09/21/21 1430  azithromycin (ZITHROMAX) 500 mg in sodium chloride 0.9 % 250 mL IVPB        500 mg 250 mL/hr over 60 Minutes Intravenous Every 24 hours 09/21/21 1344 09/23/21 1543   09/21/21 1430  cefTRIAXone (ROCEPHIN) 2 g in sodium chloride 0.9 % 100 mL IVPB  Status:  Discontinued        2 g 200 mL/hr over 30 Minutes Intravenous Every 24 hours 09/21/21 1344 09/24/21 0922        Subjective: Patient was resting comfortably when seen today.  He was complaining of lower back pain and asking for stronger medication than Tylenol.  Objective: Vitals:   11/27/21 0513 11/27/21 0538 11/27/21 0552 11/27/21 0839  BP: 108/61   109/63  Pulse: 83   82  Resp: 16   18  Temp: 97.9 F (36.6 C)   98 F (36.7 C)  TempSrc:    Axillary  SpO2: (!) 89% 95% 95% 94%  Weight:      Height:        Intake/Output Summary (Last 24 hours) at 11/27/2021 1409 Last data filed at 11/27/2021 0851 Gross per 24 hour  Intake --  Output 600  ml  Net -600 ml     Filed Weights   11/22/21 0500 11/24/21 0500 11/25/21 0300  Weight: (!) 190.5 kg (!) 193.2 kg (!) 193.2 kg    Examination:  General.  Morbidly obese gentleman, in no acute distress. Pulmonary.  Lungs clear bilaterally, normal respiratory effort. CV.  Regular rate and rhythm, no JVD, rub or murmur. Abdomen.  Soft, nontender, nondistended, BS positive. CNS.  Alert and oriented .  No focal neurologic deficit. Extremities.  No edema, no cyanosis, pulses intact and symmetrical. Psychiatry.  Judgment and insight appears normal.   Data Reviewed: I have personally reviewed following labs and imaging studies  CBC: Recent Labs  Lab 11/22/21 0554 11/23/21 0934  WBC 10.8* 10.3  HGB 7.6* 7.6*  HCT 28.1* 28.1*  MCV 84.9 85.2  PLT PLATELET CLUMPS NOTED ON SMEAR, UNABLE TO ESTIMATE PLATELET CLUMPS NOTED ON SMEAR, UNABLE TO ESTIMATE    Basic Metabolic Panel: Recent Labs  Lab 11/22/21 0554 11/23/21 0934  NA 141 141  K 4.4 4.6  CL 107 105  CO2 31 31  GLUCOSE 107* 85  BUN 18 16  CREATININE 1.05 1.04  CALCIUM 8.3* 8.4*  MG 2.1  --   PHOS 3.2  --     GFR: Estimated Creatinine Clearance: 129.9 mL/min (by C-G formula based on SCr of 1.04 mg/dL). Liver Function Tests: No results for input(s): "AST", "ALT", "ALKPHOS", "BILITOT", "PROT", "ALBUMIN" in the last 168 hours.  No results for input(s): "LIPASE", "AMYLASE" in the last 168 hours. No results for input(s): "AMMONIA" in the last 168 hours. Coagulation  Profile: No results for input(s): "INR", "PROTIME" in the last 168 hours.  Cardiac Enzymes: No results for input(s): "CKTOTAL", "CKMB", "CKMBINDEX", "TROPONINI" in the last 168 hours. BNP (last 3 results) No results for input(s): "PROBNP" in the last 8760 hours. HbA1C: No results for input(s): "HGBA1C" in the last 72 hours. CBG: No results for input(s): "GLUCAP" in the last 168 hours.  Lipid Profile: No results for input(s): "CHOL", "HDL", "LDLCALC",  "Gary", "CHOLHDL", "LDLDIRECT" in the last 72 hours. Thyroid Function Tests: No results for input(s): "TSH", "T4TOTAL", "FREET4", "T3FREE", "THYROIDAB" in the last 72 hours. Anemia Panel: No results for input(s): "VITAMINB12", "FOLATE", "FERRITIN", "TIBC", "IRON", "RETICCTPCT" in the last 72 hours.  Sepsis Labs: No results for input(s): "PROCALCITON", "LATICACIDVEN" in the last 168 hours.  No results found for this or any previous visit (from the past 240 hour(s)).   Radiology Studies: IR GASTROSTOMY TUBE REMOVAL/REPAIR  Result Date: 11/27/2021 INDICATION: 66 year old male with history of image guided percutaneous gastrostomy tube placed on 11/12/2021. Request for removal. EXAM: BEDSIDE REMOVAL OF GASTROSTOMY TUBE COMPARISON:  10/13/2021 MEDICATIONS: None. CONTRAST:  None FLUOROSCOPY TIME:  None COMPLICATIONS: None immediate. PROCEDURE: A time-out was performed prior to the initiation of the procedure. The track of the existing gastrostomy tube was lubricated with viscous lidocaine. The balloon was deflated. The gastrostomy tube was then removed intact. A dressing was placed. The patient tolerated the procedure well without immediate postprocedural complication. IMPRESSION: Successful bedside removal of indwelling gastrostomy tube without complication Ruthann Cancer, MD [facility] Electronically Signed   By: Ruthann Cancer M.D.   On: 11/27/2021 13:58    Scheduled Meds:  (feeding supplement) PROSource Plus  30 mL Oral TID PC & HS   arformoterol  15 mcg Nebulization BID   vitamin C  500 mg Oral BID   atorvastatin  10 mg Oral Daily   Chlorhexidine Gluconate Cloth  6 each Topical Daily   cholecalciferol  2,000 Units Oral Daily   escitalopram  5 mg Oral Daily   ferrous [id number] C-folic acid  1 capsule Oral BID PC   gabapentin  300 mg Oral Q8H   lidocaine  1 patch Transdermal Daily   melatonin  5 mg Oral QHS   mouth rinse  15  mL Mouth Rinse 4 times per day   pantoprazole (PROTONIX) IV  40 mg Intravenous Q12H   sodium chloride flush  3 mL Intravenous Q12H   Continuous Infusions:  sodium chloride Stopped (10/07/21 0542)   sodium chloride       LOS: 69 days    Time spent: 38 Mins  This record has been created using Systems analyst. Errors have been sought and corrected,but may not always be located. Such creation errors do not reflect on the standard of care.     Lorella Nimrod, MD [facility]   If 7PM-7AM, please contact night-coverage

## 2021-11-27 NOTE — Plan of Care (Signed)
  Problem: Education: Goal: Ability to demonstrate management of disease process will improve Outcome: Progressing   Problem: Activity: Goal: Capacity to carry out activities will improve Outcome: Progressing   

## 2021-11-28 DIAGNOSIS — J9621 Acute and chronic respiratory failure with hypoxia: Secondary | ICD-10-CM | POA: Diagnosis not present

## 2021-11-28 DIAGNOSIS — J9622 Acute and chronic respiratory failure with hypercapnia: Secondary | ICD-10-CM | POA: Diagnosis not present

## 2021-11-28 LAB — CBC
HCT: 30.2 % — ABNORMAL LOW (ref 39.0–52.0)
Hemoglobin: 7.8 g/dL — ABNORMAL LOW (ref 13.0–17.0)
MCH: 23.1 pg — ABNORMAL LOW (ref 26.0–34.0)
MCHC: 25.8 g/dL — ABNORMAL LOW (ref 30.0–36.0)
MCV: 89.6 fL (ref 80.0–100.0)
Platelets: 225 10*3/uL (ref 150–400)
RBC: 3.37 MIL/uL — ABNORMAL LOW (ref 4.22–5.81)
RDW: 19.5 % — ABNORMAL HIGH (ref 11.5–15.5)
WBC: 9.6 10*3/uL (ref 4.0–10.5)
nRBC: 3.3 % — ABNORMAL HIGH (ref 0.0–0.2)

## 2021-11-28 LAB — BASIC METABOLIC PANEL
Anion gap: 4 — ABNORMAL LOW (ref 5–15)
BUN: 20 mg/dL (ref 8–23)
CO2: 34 mmol/L — ABNORMAL HIGH (ref 22–32)
Calcium: 8.7 mg/dL — ABNORMAL LOW (ref 8.9–10.3)
Chloride: 102 mmol/L (ref 98–111)
Creatinine, Ser: 1 mg/dL (ref 0.61–1.24)
GFR, Estimated: >60 mL/min (ref >60)
Glucose, Bld: 93 mg/dL (ref 70–99)
Potassium: 5 mmol/L (ref 3.5–5.1)
Sodium: 140 mmol/L (ref 135–145)

## 2021-11-28 LAB — PATHOLOGIST SMEAR REVIEW

## 2021-11-28 MED ORDER — ADULT MULTIVITAMIN W/MINERALS CH
1.0000 | ORAL_TABLET | Freq: Every day | ORAL | Status: DC
  Administered 2021-11-29 – 2021-12-13 (×15): 1 via ORAL
  Filled 2021-11-28 (×15): qty 1

## 2021-11-28 NOTE — TOC Progression Note (Signed)
Transition of Care [facility]) - Progression Note    Patient Details  Name: Gary [name] MRN: [id number] Date of Birth: 11-23-1955  [facility]) CM/SW Contact  Beverly Sessions, RN Phone Number: 11/28/2021, 3:15 PM  Clinical Narrative:     PDN orders signed by MD, emailed to [place] with [facility]   Expected Discharge Plan: Long Term Acute Care (LTAC) Barriers to Discharge: Continued Medical Work up  Expected Discharge Plan and Services Expected Discharge Plan: [place] (LTAC)   Discharge Planning Services: CM Consult Post Acute Care Choice: Long Term Acute Care (LTAC) Living arrangements for the past 2 months: Single Family Home                 DME Arranged: N/A DME Agency: NA       HH Arranged: NA HH Agency: NA         Social Determinants of Health (SDOH) Interventions    Readmission Risk Interventions    05/04/2021    1:26 PM 04/07/2021    1:39 PM 03/12/2021   11:54 AM  Readmission Risk Prevention Plan  Transportation Screening Complete Complete Complete  PCP or Specialist Appt within 3-5 Days  Complete Complete  HRI or [place]   Complete  Social Work Consult for [place] Planning/Counseling   Complete  Palliative Care Screening  Not Applicable Not Applicable  Medication Review [employer] photographer) Complete Complete Complete  PCP or Specialist appointment within 3-5 days of discharge Complete    HRI or [place] Complete    SW Recovery Care/Counseling Consult Complete    [place] Not Applicable

## 2021-11-28 NOTE — Progress Notes (Signed)
PROGRESS NOTE    Gary [name]  IEP:[id number] DOB: [birth year] DOA: 09/19/2021 PCP: [facility] [place]   Brief Narrative: This 66 y/o male with multiple medical problems causing chronic hypercapneic respiratory failure who has had admissions in the past for exacerbations of the same and mechanical ventilation presented for evaluation of dyspnea, weakness. It is unclear if the patient has been compliant with his medications and NIMV at home.  He was admitted by the hospitalists for a presumed COPD exacerbation in the ER, treated with bronchodilators and NIMV but his mental status and hypercarbia worsened despite those interventions so he required intubation.   6/6: Presented to ED.  Required intubation and mechanical ventilation in the ED.  PCCM admitted in ICU 6/7: Hold diuresis due to worsening Creatinine.  ENT consulted for Trach placement per family request 6/8: Pt continues to spike temps tmax 101.8 degrees F.  Flexiseal placed overnight draining foul smelling watery stool~Cdiff and GI panel negative 6/9: Fever resolved.  Negative for C- diff, GI panel still pending.  Creatinine slightly worsened, requiring low dose levophed (2 mcg). Holding diuresis 6/10: propofol discontinued due hypertriglyceridemia; failed SBT 6/11: Tracheal aspirate from 6/6 resulting with Pseudomonas, ABX changed to Cefepime. neurologically intact, awaiting Trach placement 6/12: Pt calmer today following addition of oral benzo's, narcotics, and Seroquel via tube.  Creatinine worsened with decreased UOP, holding diuresis, low threshold for Nephrology consult  6/13: Pt with worsening renal failure creatinine 4.15.  UOP overnight 25 ml.  Nephrology consulted plans to start pt on hemodialysis follow dialysis catheter placement Pt underwent hemodialysis without ultrafiltration 06/14: .  Plans for HD session today.  Agitation has improved significantly with narcotics and antianxiety medications per tube   06/16:  Tracheostomy canceled 06/15 due to hyperkalemia and tentatively rescheduled for 06/20  6/18 failure to wean from vent 06/20: ENT placed Tracheostomy (#7.0 XLT cuffed).  Developed mild epistaxis post unsuccessful attempt at NGT placement.  Later developed hematochezia/melena, Protonix gtt started and GI consulted. 06/21: Diuresed yesterday with limited results. Patient had softed blood pressures throughout the day and diuresis was held as a result. HD was performed by the patient clotted off the machine and was not able to be rinsed back. Repeat hemoglobin is above 8. 06/23: Pt remains mechanically intubated settings: FiO2 50%/PEEP 10.  CXR concerning for moderate right pleural effusion with associated right basilar atelectasis vs. infiltrate.  Will perform [place] Chest to determine if pt needs right sided thoracentesis  06/24: Remains on the ventilator, FiO2 requirements decreasing, respiratory culture with abundant gram-negative rods: [name], on Meropenem 06/25: Oxygen requirements down to 40%, chest x-ray shows improvement on dense right consolidation, Pseudomonas susceptibilities pending 06/25: Pt remains on the ventilator settings: PEEP 10 and FiO2 40%; will attempt to wean PEEP to 5 to perform SBT  06/28: Pt tolerated PS on 06/27.  Overnight pt had episodes of vomiting TF's suspect secondary to malposition of nasogastric tube.  Will consult IR for PEG tube placement if unable to place PEG tube pt will need postpyloric Dobbhoff   06/29: Pt tolerated PS 5/5 for over an hour yesterday, however due to fatigue placed back on PRVC.  Pt pulled NGT out several times overnight, therefore will not attempt to reinsert.  Plans for PEG tube placement per IR on 06/30.   06/30: PEG tube placed per IR  07/1: Pt tolerated trach collar _0 % throughout the day 07/2: Pt placed on ventilator overnight settings 16/5/35% tolerating well.  Will place pt on trach  collar today and place orders for PT/OT 7/3:  Hemodynamically stable.  Tolerating TC (has remained on TC since 7/2 @ 08:00), plan for speech therapy and PT/OT 7/4: Has maintained on trach collar for greater than 48 hours, tolerating Passy-Muir valve.  Plan to transfer out of ICU. SLP recs: dysphagia 1 (puree) w/ nectar liquid.  7/5-7/6: sodium and creatinine trending appropriately, Hgb stable. Low K repleted and stable. Was off vent qhs (orders d/c or missed?) and was restarted. Will need LTAC, TOC following  7/7: pulled out trach overnight. Confusion re: vent plan among staff and myself. I asked PCCM to reevluate him and verify plan so the team and the patient can eb on the same page, and will help TOC arrange appropriate placement.  7/8: I spoke personally w/ Dr Milon Dikes [facility]) and he confirms patient does NOT need ventilator support, unless of course develops distress, but last night the patient tolerated well without it. Plan to meet w/ family today when they visit, I spoke w/ his sister yesterday on the phone. No concerns on CBC/BMP: Cr tending down a bit, Hgb stable, Na slightly high but improving over past few days. He is approaching medically stable and may be appropriate for LTAC vs SNF soon  7/9: Several long discussions with patient and family about medical condition, behavioral issues.  See separate progress notes.  Psychiatry evaluated, agree that he has capacity, started SSRI. 7/10: Patient remains medically stable, awaiting placement.  Discussed his behavioral issues again and encouraged him to always bring any concerns to nursing staff or to me calmly and I am happy to listen, requested he maintain professionalism towards staff, he seems amenable. 7/11-7/14: goal OOB to chair, needing hoyer lift, pt reeducated several times about staying in bed 07/11 and has been compliant through 07/14. Finally got bariatric chair and hoyer lift 7/12 and was OOB 07/13.  D/c monitoring and sitter and transferred to [place] status 07/13. Awaiting placement.   7/15-17- working to reorient patient, but personality and coherence remain difficult. Stable for placement. Reminded patient not to verbally abuse staff 7/28 - rising O2 needs past couple of days, due to mucus plugging.  ABG stable, normal mentation.  Improved after suctioning. 8/2: Patient awaiting placement in a long-term acute care setting.. 8/3: Patient had multiple episodes of maroon-colored big bowel movements through out night.  H&H remains stable, vitals stable but patient at high risk for decompensation.  Patient transferred to ICU.  2 unit PRBC given, GI Dr. Lynnell Jude notified,  Nuclear scan positive for active GI bleeding in the proximal colon, vascular surgery Dr. Delana Meyer and ICU Dr. Lanney Gins notified.  Patient underwent embolization of the bleeding artery. 8/4: Patient continues to have bright red blood per rectum.  Patient underwent flex sigmoidoscopy showed blood in the rectum, rectosigmoid colon, diverticulosis.  Hemoglobin dropped to 7.6.  Transfused 2 unit PRBC.  If patient continues to bleed may require general surgery evaluation. 8/5-8/8: Patient remains hemodynamically stable, Patient reports very little blood in the stools.  1 unit PRBC given, Hb 7.9. 8/9: Patient remained stable, no more bleeding per rectum.  Received total of 3 unit of PRBC.  Hemoglobin at 7.6, anemia panel with concern of iron deficiency.  Iron replacement ordered. Patient remained stable.  He was asking if he can go home in the next week or so with home health services.  Per patient he is trying his best to work with PT, they recommend patient remained for SNF. 8/11: Patient remained stable.  Started working  with PT.  He was asking to remove G-tube as he was eating and drinking okay and has not used that tube for a while-message sent to GI.  He was hoping to be able to go home. 8/12: IR placed the PEG tube and waiting to complete 6 weeks which will be done in next few days before removing. 8/14: PEG tube was  removed by IR today.  Please see postprocedure instructions. Tramadol was added at his request for better control of pain.  Assessment & Plan:   Principal Problem:   Acute on chronic respiratory failure with hypoxia and hypercapnia ([name]) Active Problems:   Major depressive disorder, recurrent episode, moderate ([name])   Bloody stool   COPD with acute exacerbation ([name])   Acute metabolic encephalopathy   HTN (hypertension)   Acute on chronic diastolic congestive heart failure ([name])   Obesity, Class III, BMI 40-49.9 (morbid obesity) ([place])   Obstructive sleep apnea   Chronic kidney disease, stage 3a ([name])   HLD (hyperlipidemia)   Pressure injury of skin   Hematochezia  Acute GI bleed /acute blood loss anemia: Improved. Nuclear scan positive for active GI bleeding in the proximal colon. Vascular surgery Dr. Delana Meyer has attempted embolization of the bleeding artery. Dr. Allen [name] with GI attempted flex sigmoidoscopy which shows blood in the sigmoid colon, rectosigmoid, diverticulum.  Received total of 3 unit of PRBC Continue Protonix 40 mg IV every 12 hours daily. Anemia panel with iron deficiency. -Received IV iron -Start him on p.o. supplement -Monitor hemoglobin -Transfuse if below 7 -Consult general surgery if continue to bleed   Left foot blood blister - appreciate podiatry evaluation; He underwent removal of blister at bedside by podiatry on 7/21 -  Use prevalon boots to aid with pressure wounds going forward.   Acute on Chronic Hypoxic Hypercapnic Respiratory Failure Acute exacerbation COPD Pseudomonal PNA OSA/OHS - S/p  tracheostomy by ENT by 6/20. - Continue Trach collar w/ ventilator support as needed / qhs but he does NOT need vent support routinely per conversation w/ Dr Milon Dikes 07/08 - Completed 10 day course of ceftazidime for treatment of Pseudomonas. - At this point, he is medically stable but placement is going to be an issue for him.   -  His behavioral issues  have been discussed with him and his behavior has improved  - Monitor mental status closely and check blood gas PRN - Has OHS and was reliant on nightly bipap prior to trach 7/29-8/2: stable on 10 L/min, no reports of significant de-sat episodes. 8/14.Currently stable on 5 L of oxygen with trach collar.   Bilateral hand and foot pain -  He has hx of gout, treated for a few days with colchicine last week.   Now having diffuse joint pains in hands and feet.   Pt and family  reports hx. of rheumatoid arthritis.   Uric acid level was normal last week ESR 48, CRP 4.2 - nonspecific Rheumatoid factor elevated 58.8 --Monitor for improvement   Acute decompensated HFpEF Hypertension - Echocardiogram 08/07/2021: LVEF 60 to 71%, grade 1 diastolic dysfunction, normal RV systolic function - Was on intermittent hemodialysis for volume removal. - has not needed HD and has remained compensated. Cancelled permacath placement  - resumed home torsemide 7/27   AKI superimposed CKD stage IIIa > Resolved Hypokalemia > Resolved. Hypernatremia > Resolved. 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 Diff negative.  Acute Blood Loss Anemia - resolved  Nasopharyngeal trauma vs Upper GI bleed - Continue BID IV PPI - per GI, no need for scope    Hyperglycemia - CBG's achs; patient refusing at times - SSI  Acute Metabolic Encephalopathy in setting of CO2 Narcosis Sedation needs in setting of mechanical ventilation - CT Head 09/19/21: negative;  UDS negative - Treatment of hypercapnia as outlined above   Trach in place G-tube removed today-11/27/2021 - No longer requiring vent support - PO intake has improved, SLP and dietary following  -Continue trach collar supplemental O2 to maintain sats >88% - Continues to eat well;     Morbid obesity: Estimated body mass index is 53.24 kg/m as calculated  from the following:   Height as of this encounter: 6' 3" (1.905 m).   Weight as of this encounter: 193.2 kg.  This  complicate the hospital course. Diet and exercise discussed in detail.   DVT prophylaxis: SCDs Code Status: Full code. Family Communication: Called sister with no response. Disposition Plan:   Status is: Inpatient Remains inpatient appropriate because: Awaiting SNF placement. Barriers; difficult disposition.  Consultants:  PCCU Nephrology ENT GI IR Psychiatry  Procedures:  10/03/21: tracheostomy placement 10/12/21: dialysis permacath placement  10/13/21: G-tube placed 11/27/2021: G-tube removed    Antimicrobials:  Anti-infectives (From admission, onward)    Start     Dose/Rate Route Frequency Ordered Stop   11/16/21 1651  ceFAZolin (ANCEF) IVPB 1 g/50 mL premix        over 30 Minutes  Continuous PRN 11/16/21 1651 11/16/21 1837   11/16/21 1559  ceFAZolin (ANCEF) IVPB 2g/100 mL premix        2 g 200 mL/hr over 30 Minutes Intravenous 30 min pre-op 11/16/21 1559 11/16/21 1700   10/12/21 1200  cefTAZidime (FORTAZ) 2 g in sodium chloride 0.9 % 100 mL IVPB        2 g 200 mL/hr over 30 Minutes Intravenous 2 times daily 10/12/21 1122 10/17/21 0844   10/12/21 0417  ceFAZolin (ANCEF) IVPB 1 g/50 mL premix  Status:  Discontinued        1 g 100 mL/hr over 30 Minutes Intravenous 30 min pre-op 10/12/21 0417 10/16/21 0751   10/09/21 2000  ceFEPIme (MAXIPIME) 1 g in sodium chloride 0.9 % 100 mL IVPB  Status:  Discontinued        1 g 200 mL/hr over 30 Minutes Intravenous Every 24 hours 10/09/21 1404 10/09/21 1515   10/09/21 2000  cefTAZidime (FORTAZ) 1 g in sodium chloride 0.9 % 100 mL IVPB  Status:  Discontinued        1 g 200 mL/hr over 30 Minutes Intravenous Every 24 hours 10/09/21 1515 10/12/21 1122   10/06/21 1830  meropenem (MERREM) 500 mg in sodium chloride 0.9 % 100 mL IVPB  Status:  Discontinued        500 mg 200 mL/hr over 30 Minutes Intravenous Every 24  hours 10/06/21 1735 10/09/21 1403   10/06/21 1800  doxycycline (VIBRAMYCIN) 100 mg in sodium chloride 0.9 % 250 mL IVPB  Status:  Discontinued        100 mg 125 mL/hr over 120 Minutes Intravenous Every 12 hours 10/06/21 1657 10/08/21 0838   09/29/21 1000  ceFEPIme (MAXIPIME) 2 g in sodium chloride 0.9 % 100 mL IVPB        2 g 200 mL/hr over 30 Minutes Intravenous Every 12 hours 09/29/21 0812 09/30/21 2342   09/26/21 2200  ceFEPIme (MAXIPIME) 1 g  in sodium chloride 0.9 % 100 mL IVPB  Status:  Discontinued        1 g 200 mL/hr over 30 Minutes Intravenous Every 24 hours 09/26/21 1154 09/29/21 0812   09/26/21 2000  ceFEPIme (MAXIPIME) 2 g in sodium chloride 0.9 % 100 mL IVPB  Status:  Discontinued        2 g 200 mL/hr over 30 Minutes Intravenous Every 12 hours 09/26/21 0809 09/26/21 1154   09/24/21 1200  ceFEPIme (MAXIPIME) 2 g in sodium chloride 0.9 % 100 mL IVPB  Status:  Discontinued        2 g 200 mL/hr over 30 Minutes Intravenous Every 8 hours 09/24/21 0950 09/26/21 0809   09/21/21 1430  azithromycin (ZITHROMAX) 500 mg in sodium chloride 0.9 % 250 mL IVPB        500 mg 250 mL/hr over 60 Minutes Intravenous Every 24 hours 09/21/21 1344 09/23/21 1543   09/21/21 1430  cefTRIAXone (ROCEPHIN) 2 g in sodium chloride 0.9 % 100 mL IVPB  Status:  Discontinued        2 g 200 mL/hr over 30 Minutes Intravenous Every 24 hours 09/21/21 1344 09/24/21 0922        Subjective: Patient continued to complain about his foot pain.  Also requesting to advance diet to regular which was done.  Objective: Vitals:   11/28/21 0755 11/28/21 0916 11/28/21 0954 11/28/21 1428  BP: 102/62 (!) 100/55    Pulse: 81 88    Resp: 20 18    Temp: 97.9 F (36.6 C) 98.7 F (37.1 C)    TempSrc: Oral Oral    SpO2: 91% (!) 86% 92% 93%  Weight:      Height:        Intake/Output Summary (Last 24 hours) at 11/28/2021 1557 Last data filed at 11/28/2021 1000 Gross per 24 hour  Intake 360 ml  Output 1100 ml  Net -740 ml      Filed Weights   11/22/21 0500 11/24/21 0500 11/25/21 0300  Weight: (!) 190.5 kg (!) 193.2 kg (!) 193.2 kg    Examination:  General.  Morbidly obese gentleman, in no acute distress.  Trach collar in place Pulmonary.  Lungs clear bilaterally, normal respiratory effort. CV.  Regular rate and rhythm, no JVD, rub or murmur. Abdomen.  Soft, nontender, nondistended, BS positive. CNS.  Alert and oriented .  No focal neurologic deficit. Extremities.  No edema, no cyanosis, pulses intact and symmetrical. Psychiatry.  Judgment and insight appears normal.   Data Reviewed: I have personally reviewed following labs and imaging studies  CBC: Recent Labs  Lab 11/22/21 0554 11/23/21 0934 11/28/21 0553  WBC 10.8* 10.3 9.6  HGB 7.6* 7.6* 7.8*  HCT 28.1* 28.1* 30.2*  MCV 84.9 85.2 89.6  PLT PLATELET CLUMPS NOTED ON SMEAR, UNABLE TO ESTIMATE PLATELET CLUMPS NOTED ON SMEAR, UNABLE TO ESTIMATE 121    Basic Metabolic Panel: Recent Labs  Lab 11/22/21 0554 11/23/21 0934 11/28/21 0553  NA 141 141 140  K 4.4 4.6 5.0  CL 107 105 102  CO2 31 31 34*  GLUCOSE 107* 85 93  BUN _0 CREATININE 1.05 1.04 1.00  CALCIUM 8.3* 8.4* 8.7*  MG 2.1  --   --   PHOS 3.2  --   --     GFR: Estimated Creatinine Clearance: 135.1 mL/min (by C-G formula based on SCr of 1 mg/dL). Liver Function Tests: No results for input(s): "AST", "ALT", "ALKPHOS", "BILITOT", "PROT", "ALBUMIN" in  the last 168 hours.  No results for input(s): "LIPASE", "AMYLASE" in the last 168 hours. No results for input(s): "AMMONIA" in the last 168 hours. Coagulation Profile: No results for input(s): "INR", "PROTIME" in the last 168 hours.  Cardiac Enzymes: No results for input(s): "CKTOTAL", "CKMB", "CKMBINDEX", "TROPONINI" in the last 168 hours. BNP (last 3 results) No results for input(s): "PROBNP" in the last 8760 hours. HbA1C: No results for input(s): "HGBA1C" in the last 72 hours. CBG: No results for input(s):  "GLUCAP" in the last 168 hours.  Lipid Profile: No results for input(s): "CHOL", "HDL", "LDLCALC", "Gary", "CHOLHDL", "LDLDIRECT" in the last 72 hours. Thyroid Function Tests: No results for input(s): "TSH", "T4TOTAL", "FREET4", "T3FREE", "THYROIDAB" in the last 72 hours. Anemia Panel: No results for input(s): "VITAMINB12", "FOLATE", "FERRITIN", "TIBC", "IRON", "RETICCTPCT" in the last 72 hours.  Sepsis Labs: No results for input(s): "PROCALCITON", "LATICACIDVEN" in the last 168 hours.  No results found for this or any previous visit (from the past 240 hour(s)).   Radiology Studies: IR GASTROSTOMY TUBE REMOVAL/REPAIR  Result Date: 11/27/2021 INDICATION: 66 year old male with history of image guided percutaneous gastrostomy tube placed on 11/12/2021. Request for removal. EXAM: BEDSIDE REMOVAL OF GASTROSTOMY TUBE COMPARISON:  10/13/2021 MEDICATIONS: None. CONTRAST:  None FLUOROSCOPY TIME:  None COMPLICATIONS: None immediate. PROCEDURE: A time-out was performed prior to the initiation of the procedure. The track of the existing gastrostomy tube was lubricated with viscous lidocaine. The balloon was deflated. The gastrostomy tube was then removed intact. A dressing was placed. The patient tolerated the procedure well without immediate postprocedural complication. IMPRESSION: Successful bedside removal of indwelling gastrostomy tube without complication Ruthann Cancer, MD [facility] Radiology Electronically Signed   By: Ruthann Cancer M.D.   On: 11/27/2021 13:58    Scheduled Meds:  (feeding supplement) PROSource Plus  30 mL Oral TID PC & HS   arformoterol  15 mcg Nebulization BID   vitamin C  500 mg Oral BID   atorvastatin  10 mg Oral Daily   Chlorhexidine Gluconate Cloth  6 each Topical Daily   cholecalciferol  2,000 Units Oral Daily   escitalopram  5 mg Oral Daily   ferrous [id number] C-folic acid  1 capsule Oral BID PC   gabapentin   300 mg Oral Q8H   lidocaine  1 patch Transdermal Daily   melatonin  5 mg Oral QHS   mouth rinse  15 mL Mouth Rinse 4 times per day   pantoprazole (PROTONIX) IV  40 mg Intravenous Q12H   sodium chloride flush  3 mL Intravenous Q12H   Continuous Infusions:  sodium chloride Stopped (10/07/21 0542)   sodium chloride       LOS: 70 days    Time spent: 35 Mins  This record has been created using Systems analyst. Errors have been sought and corrected,but may not always be located. Such creation errors do not reflect on the standard of care.     Lorella Nimrod, MD [facility]   If 7PM-7AM, please contact night-coverage

## 2021-11-28 NOTE — Progress Notes (Signed)
Nutrition Follow Up Note   DOCUMENTATION CODES:   Morbid obesity  INTERVENTION:   Double protein with meals   MVI po daily   Vitamin C 516m po BID  Vitamin D 2000 units po daily   NUTRITION DIAGNOSIS:   Inadequate oral intake related to inability to eat (pt sedated and ventilated) as evidenced by NPO status. -resolving  GOAL:   Patient will meet greater than or equal to 90% of their needs -progressing   MONITOR:   PO intake, Supplement acceptance, Labs, Weight trends, Skin, I & O's  ASSESSMENT:   66y/o male with h/o PUD, OSA, HTN, CKD III, substance abuse, CHF, Hep C and COPD and COVID 19 (02/2021) who is admitted with COPD exacerbation, CHF and AKI. 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  -Pt s/p tracheostomy 6/20 -Pt s/p 61F IR G-tube 6/30 (now removed)  Met with pt in room today. Pt continues to have good appetite and oral intake; pt eating 100% of meals. G-tube removed 8/14 as pt refused all nutrition support via tube. Pt changed back to a regular diet today; pt is getting double protein with meals. Per chart, pt remains up ~26lbs from his UBW.   Medications reviewed and include: vitamin C, vitamin D, melatonin, protonix, ferrous fumerate  Labs reviewed: K 5.0 wnl Hgb 7.8(L), Hct 30.2(L), MCH 23.1(L), MCHC 25.8(L) Iron 19(L), TIBC 333, ferritin 17(L)- 8/9   Diet Order:    Diet Order             Diet regular Room service appropriate? Yes; Fluid consistency: Thin  Diet effective now                  EDUCATION NEEDS:   No education needs have been identified at this time  Skin:  Skin Assessment: Skin Integrity Issues: Skin Integrity Issues:: Incisions, Stage II, Other (Comment) Stage II: medial perineum Incisions: closed neck s/p trach Other: MASD to rt groin  Last BM:  8/15- type 6  Height:   Ht Readings from Last 1 Encounters:  11/03/21 _0  (1.905 m)    Weight:   Wt Readings from Last 1 Encounters:  11/25/21  (!) 193.2 kg    Ideal Body Weight:  89 kg  BMI:  Body mass index is 53.24 kg/m.  Estimated Nutritional Needs:   Kcal:  2800-3100kcal/day  Protein:  >145g/day  Fluid:  2.0L/day  CKoleen DistanceMS, RD, LDN Please refer to [facility]for RD and/or RD on-call/weekend/after hours pager

## 2021-11-29 ENCOUNTER — Inpatient Hospital Stay: Payer: Medicaid Other

## 2021-11-29 DIAGNOSIS — J9621 Acute and chronic respiratory failure with hypoxia: Secondary | ICD-10-CM | POA: Diagnosis not present

## 2021-11-29 DIAGNOSIS — J9622 Acute and chronic respiratory failure with hypercapnia: Secondary | ICD-10-CM | POA: Diagnosis not present

## 2021-11-29 LAB — BLOOD GAS, ARTERIAL
Acid-Base Excess: 8.5 mmol/L — ABNORMAL HIGH (ref 0.0–2.0)
Bicarbonate: 40.4 mmol/L — ABNORMAL HIGH (ref 20.0–28.0)
FIO2: 40 %
O2 Saturation: 92.4 %
Patient temperature: 37
pCO2 arterial: 101 mmHg (ref 32–48)
pH, Arterial: 7.21 — ABNORMAL LOW (ref 7.35–7.45)
pO2, Arterial: 65 mmHg — ABNORMAL LOW (ref 83–108)

## 2021-11-29 LAB — GLUCOSE, CAPILLARY: Glucose-Capillary: 110 mg/dL — ABNORMAL HIGH (ref 70–99)

## 2021-11-29 MED ORDER — CHLORHEXIDINE GLUCONATE CLOTH 2 % EX PADS
6.0000 | MEDICATED_PAD | Freq: Every day | CUTANEOUS | Status: DC
  Administered 2021-12-01 – 2021-12-13 (×9): 6 via TOPICAL

## 2021-11-29 MED ORDER — PANTOPRAZOLE SODIUM 40 MG PO TBEC
40.0000 mg | DELAYED_RELEASE_TABLET | Freq: Two times a day (BID) | ORAL | Status: DC
  Administered 2021-11-29 – 2021-12-13 (×28): 40 mg via ORAL
  Filled 2021-11-29 (×28): qty 1

## 2021-11-29 MED ORDER — ORAL CARE MOUTH RINSE: 15.0000 mL | OROMUCOSAL | Status: DC | PRN

## 2021-11-29 NOTE — Progress Notes (Signed)
Pt was transported to ICU 16 on venturi mask and placed on Vent per NP order post ABG results.

## 2021-11-29 NOTE — Plan of Care (Signed)
Pt AAOx4, intermittent pain in back and bilateral feet. Plan for mobility and to maintain adequate oxygenation as tolerated.VS are WNL. Bed in lowest position, call light within reach. Will continue to monitor.

## 2021-11-29 NOTE — Progress Notes (Signed)
PROGRESS NOTE    Gary [name]  IEP:[id number] DOB: [birth year] DOA: 09/19/2021 PCP: [facility] [place]   Brief Narrative: This 66 y/o male with multiple medical problems causing chronic hypercapneic respiratory failure who has had admissions in the past for exacerbations of the same and mechanical ventilation presented for evaluation of dyspnea, weakness. It is unclear if the patient has been compliant with his medications and NIMV at home.  He was admitted by the hospitalists for a presumed COPD exacerbation in the ER, treated with bronchodilators and NIMV but his mental status and hypercarbia worsened despite those interventions so he required intubation.   6/6: Presented to ED.  Required intubation and mechanical ventilation in the ED.  PCCM admitted in ICU 6/7: Hold diuresis due to worsening Creatinine.  ENT consulted for Trach placement per family request 6/8: Pt continues to spike temps tmax 101.8 degrees F.  Flexiseal placed overnight draining foul smelling watery stool~Cdiff and GI panel negative 6/9: Fever resolved.  Negative for C- diff, GI panel still pending.  Creatinine slightly worsened, requiring low dose levophed (2 mcg). Holding diuresis 6/10: propofol discontinued due hypertriglyceridemia; failed SBT 6/11: Tracheal aspirate from 6/6 resulting with Pseudomonas, ABX changed to Cefepime. neurologically intact, awaiting Trach placement 6/12: Pt calmer today following addition of oral benzo's, narcotics, and Seroquel via tube.  Creatinine worsened with decreased UOP, holding diuresis, low threshold for Nephrology consult  6/13: Pt with worsening renal failure creatinine 4.15.  UOP overnight 25 ml.  Nephrology consulted plans to start pt on hemodialysis follow dialysis catheter placement Pt underwent hemodialysis without ultrafiltration 06/14: .  Plans for HD session today.  Agitation has improved significantly with narcotics and antianxiety medications per tube   06/16:  Tracheostomy canceled 06/15 due to hyperkalemia and tentatively rescheduled for 06/20  6/18 failure to wean from vent 06/20: ENT placed Tracheostomy (#7.0 XLT cuffed).  Developed mild epistaxis post unsuccessful attempt at NGT placement.  Later developed hematochezia/melena, Protonix gtt started and GI consulted. 06/21: Diuresed yesterday with limited results. Patient had softed blood pressures throughout the day and diuresis was held as a result. HD was performed by the patient clotted off the machine and was not able to be rinsed back. Repeat hemoglobin is above 8. 06/23: Pt remains mechanically intubated settings: FiO2 50%/PEEP 10.  CXR concerning for moderate right pleural effusion with associated right basilar atelectasis vs. infiltrate.  Will perform [place] Chest to determine if pt needs right sided thoracentesis  06/24: Remains on the ventilator, FiO2 requirements decreasing, respiratory culture with abundant gram-negative rods: [name], on Meropenem 06/25: Oxygen requirements down to 40%, chest x-ray shows improvement on dense right consolidation, Pseudomonas susceptibilities pending 06/25: Pt remains on the ventilator settings: PEEP 10 and FiO2 40%; will attempt to wean PEEP to 5 to perform SBT  06/28: Pt tolerated PS on 06/27.  Overnight pt had episodes of vomiting TF's suspect secondary to malposition of nasogastric tube.  Will consult IR for PEG tube placement if unable to place PEG tube pt will need postpyloric Dobbhoff   06/29: Pt tolerated PS 5/5 for over an hour yesterday, however due to fatigue placed back on PRVC.  Pt pulled NGT out several times overnight, therefore will not attempt to reinsert.  Plans for PEG tube placement per IR on 06/30.   06/30: PEG tube placed per IR  07/1: Pt tolerated trach collar _0 % throughout the day 07/2: Pt placed on ventilator overnight settings 16/5/35% tolerating well.  Will place pt on trach  collar today and place orders for PT/OT 7/3:  Hemodynamically stable.  Tolerating TC (has remained on TC since 7/2 @ 08:00), plan for speech therapy and PT/OT 7/4: Has maintained on trach collar for greater than 48 hours, tolerating Passy-Muir valve.  Plan to transfer out of ICU. SLP recs: dysphagia 1 (puree) w/ nectar liquid.  7/5-7/6: sodium and creatinine trending appropriately, Hgb stable. Low K repleted and stable. Was off vent qhs (orders d/c or missed?) and was restarted. Will need LTAC, TOC following  7/7: pulled out trach overnight. Confusion re: vent plan among staff and myself. I asked PCCM to reevluate him and verify plan so the team and the patient can eb on the same page, and will help TOC arrange appropriate placement.  7/8: I spoke personally w/ Dr Milon Dikes [facility]) and he confirms patient does NOT need ventilator support, unless of course develops distress, but last night the patient tolerated well without it. Plan to meet w/ family today when they visit, I spoke w/ his sister yesterday on the phone. No concerns on CBC/BMP: Cr tending down a bit, Hgb stable, Na slightly high but improving over past few days. He is approaching medically stable and may be appropriate for LTAC vs SNF soon  7/9: Several long discussions with patient and family about medical condition, behavioral issues.  See separate progress notes.  Psychiatry evaluated, agree that he has capacity, started SSRI. 7/10: Patient remains medically stable, awaiting placement.  Discussed his behavioral issues again and encouraged him to always bring any concerns to nursing staff or to me calmly and I am happy to listen, requested he maintain professionalism towards staff, he seems amenable. 7/11-7/14: goal OOB to chair, needing hoyer lift, pt reeducated several times about staying in bed 07/11 and has been compliant through 07/14. Finally got bariatric chair and hoyer lift 7/12 and was OOB 07/13.  D/c monitoring and sitter and transferred to [place] status 07/13. Awaiting placement.   7/15-17- working to reorient patient, but personality and coherence remain difficult. Stable for placement. Reminded patient not to verbally abuse staff 7/28 - rising O2 needs past couple of days, due to mucus plugging.  ABG stable, normal mentation.  Improved after suctioning. 8/2: Patient awaiting placement in a long-term acute care setting.. 8/3: Patient had multiple episodes of maroon-colored big bowel movements through out night.  H&H remains stable, vitals stable but patient at high risk for decompensation.  Patient transferred to ICU.  2 unit PRBC given, GI Dr. Lynnell Jude notified,  Nuclear scan positive for active GI bleeding in the proximal colon, vascular surgery Dr. Delana Meyer and ICU Dr. Lanney Gins notified.  Patient underwent embolization of the bleeding artery. 8/4: Patient continues to have bright red blood per rectum.  Patient underwent flex sigmoidoscopy showed blood in the rectum, rectosigmoid colon, diverticulosis.  Hemoglobin dropped to 7.6.  Transfused 2 unit PRBC.  If patient continues to bleed may require general surgery evaluation. 8/5-8/8: Patient remains hemodynamically stable, Patient reports very little blood in the stools.  1 unit PRBC given, Hb 7.9. 8/9: Patient remained stable, no more bleeding per rectum.  Received total of 3 unit of PRBC.  Hemoglobin at 7.6, anemia panel with concern of iron deficiency.  Iron replacement ordered. Patient remained stable.  He was asking if he can go home in the next week or so with home health services.  Per patient he is trying his best to work with PT, they recommend patient remained for SNF. 8/11: Patient remained stable.  Started working  with PT.  He was asking to remove G-tube as he was eating and drinking okay and has not used that tube for a while-message sent to GI.  He was hoping to be able to go home. 8/12: IR placed the PEG tube and waiting to complete 6 weeks which will be done in next few days before removing. 8/14: PEG tube was  removed by IR today.  Please see postprocedure instructions. Tramadol was added at his request for better control of pain.  Assessment & Plan:   Principal Problem:   Acute on chronic respiratory failure with hypoxia and hypercapnia ([name]) Active Problems:   Major depressive disorder, recurrent episode, moderate ([name])   Bloody stool   COPD with acute exacerbation ([name])   Acute metabolic encephalopathy   HTN (hypertension)   Acute on chronic diastolic congestive heart failure ([name])   Obesity, Class III, BMI 40-49.9 (morbid obesity) ([place])   Obstructive sleep apnea   Chronic kidney disease, stage 3a ([name])   HLD (hyperlipidemia)   Pressure injury of skin   Hematochezia  Acute GI bleed /acute blood loss anemia: Improved. Nuclear scan positive for active GI bleeding in the proximal colon. Vascular surgery Dr. Delana Meyer has attempted embolization of the bleeding artery. Dr. Allen [name] with GI attempted flex sigmoidoscopy which shows blood in the sigmoid colon, rectosigmoid, diverticulum.  Received total of 3 unit of PRBC Continue Protonix 40 mg IV every 12 hours daily. Anemia panel with iron deficiency. -Received IV iron -Start him on p.o. supplement -Monitor hemoglobin -Transfuse if below 7 -Consult general surgery if continue to bleed   Left foot blood blister - appreciate podiatry evaluation; He underwent removal of blister at bedside by podiatry on 7/21 -  Use prevalon boots to aid with pressure wounds going forward.   Acute on Chronic Hypoxic Hypercapnic Respiratory Failure Acute exacerbation COPD Pseudomonal PNA OSA/OHS - S/p  tracheostomy by ENT by 6/20. - Continue Trach collar w/ ventilator support as needed / qhs but he does NOT need vent support routinely per conversation w/ Dr Milon Dikes 07/08 - Completed 10 day course of ceftazidime for treatment of Pseudomonas. - At this point, he is medically stable but placement is going to be an issue for him.   -  His behavioral issues  have been discussed with him and his behavior has improved  - Monitor mental status closely and check blood gas PRN - Has OHS and was reliant on nightly bipap prior to trach 7/29-8/2: stable on 10 L/min, no reports of significant de-sat episodes. 8/14.Currently stable on 5 L of oxygen with trach collar.   Bilateral hand and foot pain -  He has hx of gout, treated for a few days with colchicine last week.   Now having diffuse joint pains in hands and feet.   Pt and family  reports hx. of rheumatoid arthritis.   Uric acid level was normal last week ESR 48, CRP 4.2 - nonspecific Rheumatoid factor elevated 58.8 --Monitor for improvement   Acute decompensated HFpEF Hypertension - Echocardiogram 08/07/2021: LVEF 60 to 25%, grade 1 diastolic dysfunction, normal RV systolic function - Was on intermittent hemodialysis for volume removal. - has not needed HD and has remained compensated. Cancelled permacath placement  - resumed home torsemide 7/27   AKI superimposed CKD stage IIIa > Resolved Hypokalemia > Resolved. Hypernatremia > Resolved. 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 Diff negative.  Acute Blood Loss Anemia - resolved  Nasopharyngeal trauma vs Upper GI bleed - Continue BID IV PPI - per GI, no need for scope    Hyperglycemia - CBG's achs; patient refusing at times - SSI  Acute Metabolic Encephalopathy in setting of CO2 Narcosis Sedation needs in setting of mechanical ventilation - CT Head 09/19/21: negative;  UDS negative - Treatment of hypercapnia as outlined above   Trach in place G-tube removed today-11/27/2021 - No longer requiring vent support - PO intake has improved, SLP and dietary following  -Continue trach collar supplemental O2 to maintain sats >88% - Continues to eat well;     Morbid obesity: Estimated body mass index is 53.4 kg/m as calculated  from the following:   Height as of this encounter: 6' 3" (1.905 m).   Weight as of this encounter: 193.8 kg.  This  complicate the hospital course. Diet and exercise discussed in detail.   DVT prophylaxis: SCDs Code Status: Full code. Family Communication:  Disposition Plan:   Status is: Inpatient Remains inpatient appropriate because: Awaiting SNF placement. Barriers; difficult disposition.  Consultants:  PCCU Nephrology ENT GI IR Psychiatry  Procedures:  10/03/21: tracheostomy placement 10/12/21: dialysis permacath placement  10/13/21: G-tube placed 11/27/2021: G-tube removed    Antimicrobials:  Anti-infectives (From admission, onward)    Start     Dose/Rate Route Frequency Ordered Stop   11/16/21 1651  ceFAZolin (ANCEF) IVPB 1 g/50 mL premix        over 30 Minutes  Continuous PRN 11/16/21 1651 11/16/21 1837   11/16/21 1559  ceFAZolin (ANCEF) IVPB 2g/100 mL premix        2 g 200 mL/hr over 30 Minutes Intravenous 30 min pre-op 11/16/21 1559 11/16/21 1700   10/12/21 1200  cefTAZidime (FORTAZ) 2 g in sodium chloride 0.9 % 100 mL IVPB        2 g 200 mL/hr over 30 Minutes Intravenous 2 times daily 10/12/21 1122 10/17/21 0844   10/12/21 0417  ceFAZolin (ANCEF) IVPB 1 g/50 mL premix  Status:  Discontinued        1 g 100 mL/hr over 30 Minutes Intravenous 30 min pre-op 10/12/21 0417 10/16/21 0751   10/09/21 2000  ceFEPIme (MAXIPIME) 1 g in sodium chloride 0.9 % 100 mL IVPB  Status:  Discontinued        1 g 200 mL/hr over 30 Minutes Intravenous Every 24 hours 10/09/21 1404 10/09/21 1515   10/09/21 2000  cefTAZidime (FORTAZ) 1 g in sodium chloride 0.9 % 100 mL IVPB  Status:  Discontinued        1 g 200 mL/hr over 30 Minutes Intravenous Every 24 hours 10/09/21 1515 10/12/21 1122   10/06/21 1830  meropenem (MERREM) 500 mg in sodium chloride 0.9 % 100 mL IVPB  Status:  Discontinued        500 mg 200 mL/hr over 30 Minutes Intravenous Every 24 hours 10/06/21 1735 10/09/21 1403    10/06/21 1800  doxycycline (VIBRAMYCIN) 100 mg in sodium chloride 0.9 % 250 mL IVPB  Status:  Discontinued        100 mg 125 mL/hr over 120 Minutes Intravenous Every 12 hours 10/06/21 1657 10/08/21 0838   09/29/21 1000  ceFEPIme (MAXIPIME) 2 g in sodium chloride 0.9 % 100 mL IVPB        2 g 200 mL/hr over 30 Minutes Intravenous Every 12 hours 09/29/21 0812 09/30/21 2342   09/26/21 2200  ceFEPIme (MAXIPIME) 1 g in sodium chloride 0.9 %  100 mL IVPB  Status:  Discontinued        1 g 200 mL/hr over 30 Minutes Intravenous Every 24 hours 09/26/21 1154 09/29/21 0812   09/26/21 2000  ceFEPIme (MAXIPIME) 2 g in sodium chloride 0.9 % 100 mL IVPB  Status:  Discontinued        2 g 200 mL/hr over 30 Minutes Intravenous Every 12 hours 09/26/21 0809 09/26/21 1154   09/24/21 1200  ceFEPIme (MAXIPIME) 2 g in sodium chloride 0.9 % 100 mL IVPB  Status:  Discontinued        2 g 200 mL/hr over 30 Minutes Intravenous Every 8 hours 09/24/21 0950 09/26/21 0809   09/21/21 1430  azithromycin (ZITHROMAX) 500 mg in sodium chloride 0.9 % 250 mL IVPB        500 mg 250 mL/hr over 60 Minutes Intravenous Every 24 hours 09/21/21 1344 09/23/21 1543   09/21/21 1430  cefTRIAXone (ROCEPHIN) 2 g in sodium chloride 0.9 % 100 mL IVPB  Status:  Discontinued        2 g 200 mL/hr over 30 Minutes Intravenous Every 24 hours 09/21/21 1344 09/24/21 0922        Subjective: Patient was sitting comfortably in chair when seen today.  Started working with PT. No other complaints.  Objective: Vitals:   11/29/21 0740 11/29/21 0817 11/29/21 1150 11/29/21 1500  BP: 110/64     Pulse: 87 86 84   Resp: _0 Temp: 98.6 F (37 C)     TempSrc: Oral     SpO2: 97% 97% 90%   Weight:      Height:        Intake/Output Summary (Last 24 hours) at 11/29/2021 1617 Last data filed at 11/29/2021 1142 Gross per 24 hour  Intake --  Output 1450 ml  Net -1450 ml     Filed Weights   11/24/21 0500 11/25/21 0300 11/29/21 0500   Weight: (!) 193.2 kg (!) 193.2 kg (!) 193.8 kg    Examination:  General.  Morbidly obese gentleman, in no acute distress.  Trach collar in place Pulmonary.  Lungs clear bilaterally, normal respiratory effort. CV.  Regular rate and rhythm, no JVD, rub or murmur. Abdomen.  Soft, nontender, nondistended, BS positive. CNS.  Alert and oriented .  No focal neurologic deficit. Extremities.  No edema, no cyanosis, pulses intact and symmetrical. Psychiatry.  Judgment and insight appears normal.   Data Reviewed: I have personally reviewed following labs and imaging studies  CBC: Recent Labs  Lab 11/23/21 0934 11/28/21 0553  WBC 10.3 9.6  HGB 7.6* 7.8*  HCT 28.1* 30.2*  MCV 85.2 89.6  PLT PLATELET CLUMPS NOTED ON SMEAR, UNABLE TO ESTIMATE 828    Basic Metabolic Panel: Recent Labs  Lab 11/23/21 0934 11/28/21 0553  NA 141 140  K 4.6 5.0  CL 105 102  CO2 31 34*  GLUCOSE 85 93  BUN 16 20  CREATININE 1.04 1.00  CALCIUM 8.4* 8.7*    GFR: Estimated Creatinine Clearance: 135.3 mL/min (by C-G formula based on SCr of 1 mg/dL). Liver Function Tests: No results for input(s): "AST", "ALT", "ALKPHOS", "BILITOT", "PROT", "ALBUMIN" in the last 168 hours.  No results for input(s): "LIPASE", "AMYLASE" in the last 168 hours. No results for input(s): "AMMONIA" in the last 168 hours. Coagulation Profile: No results for input(s): "INR", "PROTIME" in the last 168 hours.  Cardiac Enzymes: No results for input(s): "CKTOTAL", "CKMB", "CKMBINDEX", "TROPONINI" in the last 168 hours.  BNP (last 3 results) No results for input(s): "PROBNP" in the last 8760 hours. HbA1C: No results for input(s): "HGBA1C" in the last 72 hours. CBG: No results for input(s): "GLUCAP" in the last 168 hours.  Lipid Profile: No results for input(s): "CHOL", "HDL", "LDLCALC", "Gary", "CHOLHDL", "LDLDIRECT" in the last 72 hours. Thyroid Function Tests: No results for input(s): "TSH", "T4TOTAL", "FREET4", "T3FREE",  "THYROIDAB" in the last 72 hours. Anemia Panel: No results for input(s): "VITAMINB12", "FOLATE", "FERRITIN", "TIBC", "IRON", "RETICCTPCT" in the last 72 hours.  Sepsis Labs: No results for input(s): "PROCALCITON", "LATICACIDVEN" in the last 168 hours.  No results found for this or any previous visit (from the past 240 hour(s)).   Radiology Studies: No results found.  Scheduled Meds:  arformoterol  15 mcg Nebulization BID   vitamin C  500 mg Oral BID   atorvastatin  10 mg Oral Daily   Chlorhexidine Gluconate Cloth  6 each Topical Daily   cholecalciferol  2,000 Units Oral Daily   escitalopram  5 mg Oral Daily   ferrous [id number] C-folic acid  1 capsule Oral BID PC   gabapentin  300 mg Oral Q8H   lidocaine  1 patch Transdermal Daily   melatonin  5 mg Oral QHS   multivitamin with minerals  1 tablet Oral Daily   mouth rinse  15 mL Mouth Rinse 4 times per day   pantoprazole  40 mg Oral BID AC   sodium chloride flush  3 mL Intravenous Q12H   Continuous Infusions:  sodium chloride Stopped (10/07/21 0542)   sodium chloride       LOS: 71 days    Time spent: 37 Mins  This record has been created using Systems analyst. Errors have been sought and corrected,but may not always be located. Such creation errors do not reflect on the standard of care.     Lorella Nimrod, MD [facility]   If 7PM-7AM, please contact night-coverage

## 2021-11-29 NOTE — Progress Notes (Incomplete)
   PATIENT: Gary [name]            BTC:[id number]  DOB: October 04, 1955  Subjective: Nurse reports patient somnolent. Awakens only to verbal/tactile info, not at baseline affect  Objective: Vitals:   11/29/21 2240 11/29/21 2300  BP: 111/70 (!) 100/55  Pulse: 88 84  Resp: (!) 26 (!) 26  Temp: 99 F (37.2 C)   SpO2: 100% 97%     Latest Reference Range & Units 11/29/21 21:33  Delivery systems  TRACH COLLAR/TRACH TUBE  FIO2 % 40  pH, Arterial 7.35 - 7.45  7.21 (L)  pCO2 arterial 32 - 48 mmHg 101 (HH)  pO2, Arterial 83 - 108 mmHg 65 (L)  Acid-Base Excess 0.0 - 2.0 mmol/L 8.5 (H)  Bicarbonate 20.0 - 28.0 mmol/L 40.4 (H)  O2 Saturation % 92.4  Patient temperature  37.0  Collection site  RIGHT RADIAL  Allens test (pass/fail) PASS  PASS  [facility]): Data is critically high (L): Data is abnormally low (H): Data is abnormally high  Assessment: Neuro - open eyes to name called falls asleep without continued stimulation, no focal neuro deficits noted Resp - diminished   Plan:  Acute on chronic respiratory failure with hypercapnea Transferred to ICU for vent support Consult intensivist - discussed with DR. Mannam Chest xray - per radiologist report mild bibasilar vascular congestion which does seem to be his baseline. No assessment findings  of fluid overload are present  Sister updated via phone   [name] Calabretta L. Randol Kern NP  This am patient almost back to baseline, was given on e low dose of ativan for severe combativeness. ABG was unable to be drawn.  VBG ordered

## 2021-11-29 NOTE — Progress Notes (Signed)
Physical Therapy Treatment Patient Details Name: Gary [name] MRN: [id number] DOB: 08-12-1955 Today's Date: 11/29/2021   History of Present Illness Pt is a 66 y/o male admitted mid June secondary to SOB, COPD exacerbation, acute metabolic encephalopathy, acute on chronic hypoxic and hypercapnic respiratory failure in setting of acute decompensated HFpEF and AECOPD requiring intubation and mechanical ventilation. The pt is now s/p tracheostomy and PEG tube placement. New medical complications including GI bleed and multiple patient refusuals.    PT Comments    Pt wa still sitting in recliner from [employer] lifting pt to chair earlier in the day. He remains A and O and agreeable to 2pm PT/OT co-treat. Pt was able to perform slideboard transfer back to bed from recliner with +2 min assist.  [name] on importance of increasing activity to improve safety with all ADLs. He states understanding but still will need reenforcement going forward. Pt has been somewhat self limiting throughout admission. He has put forth effort in last two session with current author. Will return tomorrow to attempt standing and continue to address strength/mobility deficits. He was on trach collar throughout session without any respiratory concerns. Pt's HR elevated to low 100s with minimal activity. Anxiety/fear continues to limit pt's abilities to perform desired task. Overall pt is progressing but slowly. Acute PT will continue to follow and progress as able per current POC.    Recommendations for follow up therapy are one component of a multi-disciplinary discharge planning process, led by the attending physician.  Recommendations may be updated based on patient status, additional functional criteria and insurance authorization.  Follow Up Recommendations  Home health PT (with 24/7 supervision assistance available) Can patient physically be transported by private vehicle: No   Assistance  Recommended at Discharge Frequent or constant Supervision/Assistance  Patient can return home with the following Two people to help with walking and/or transfers;Two people to help with bathing/dressing/bathroom;Assistance with cooking/housework;Assist for transportation;Help with stairs or ramp for entrance   Equipment Recommendations  None recommended by PT       Precautions / Restrictions Precautions Precautions: Fall Precaution Comments: trach Restrictions Weight Bearing Restrictions: No Other Position/Activity Restrictions: Paring of blood blister on LLE 11/03/21; Per Dr Luana Shu no weight bearing restrictions on LLE     Mobility  Bed Mobility Overal bed mobility: Needs Assistance Bed Mobility: Sit to Supine  Sit to supine: +2 for physical assistance, Mod assist   General bed mobility comments: pt requires extensive assistance to progress BLEs into bed form EOB short sit    Transfers Overall transfer level: Needs assistance Equipment used: Sliding board Transfers: Bed to chair/wheelchair/BSC       Lateral/Scoot Transfers: With slide board, Min assist, +2 physical assistance General transfer comment: pt was able to use slideboard to transfer from recliner> bed. ioncreased time and constant encouargement. +2 min assist moreso for pt safety then for physical assist.    Ambulation/Gait  General Gait Details: unable    Balance Overall balance assessment: Needs assistance Sitting-balance support: Feet unsupported, Bilateral upper extremity supported Sitting balance-Gary [name] Scale: Good       Cognition Arousal/Alertness: Awake/alert Behavior During Therapy: WFL for tasks assessed/performed Overall Cognitive Status: Within Functional Limits for tasks assessed Area of Impairment: Safety/judgement, Awareness    General Comments: Pt is alert and oriented x4, increased time and encouragement for task completion           General Comments General comments (skin integrity,  edema, etc.): Reviewed/re-educated pt on importance  of performing ther ex throughout the day to maximize independnece while promoting return in strength/function.      Pertinent Vitals/Pain Pain Assessment Pain Assessment: 0-10 Pain Score: 4      PT Goals (current goals can now be found in the care plan section) Acute Rehab PT Goals Patient Stated Goal: home Progress towards PT goals: Progressing toward goals    Frequency    Min 2X/week      PT Plan Current plan remains appropriate    Co-evaluation   Reason for Co-Treatment: Complexity of the patient's impairments (multi-system involvement);Necessary to address cognition/behavior during functional activity;For patient/therapist safety;To address functional/ADL transfers PT goals addressed during session: Mobility/safety with mobility;Balance;Strengthening/ROM OT goals addressed during session: ADL's and self-care      AM-PAC PT "6 Clicks" Mobility   Outcome Measure  Help needed turning from your back to your side while in a flat bed without using bedrails?: A Lot Help needed moving from lying on your back to sitting on the side of a flat bed without using bedrails?: A Lot Help needed moving to and from a bed to a chair (including a wheelchair)?: Total Help needed standing up from a chair using your arms (e.g., wheelchair or bedside chair)?: A Lot Help needed to walk in hospital room?: Total Help needed climbing 3-5 steps with a railing? : Total 6 Click Score: 9    End of Session Equipment Utilized During Treatment:  (trach collar) Activity Tolerance: Patient tolerated treatment well Patient left: in bed;with call bell/phone within reach Nurse Communication: Mobility status;Patient requests pain meds PT Visit Diagnosis: Unsteadiness on feet (R26.81);Muscle weakness (generalized) (M62.81);Difficulty in walking, not elsewhere classified (R26.2)     Time: 1355-1420 PT Time Calculation (min) (ACUTE ONLY): 25  min  Charges:  $Therapeutic Activity: 8-22 mins                    Julaine Fusi PTA 11/29/21, 5:22 PM
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
## 2021-11-29 NOTE — Progress Notes (Signed)
Upon entering patients room, this nurse attempted to wake patient up. Patient very drowsy and kept going back to sleep. Vital signs and blood sugar obtained. Respirations were 24. Page Sharion Settler to let her know patient drowsy not wanting to wake up look at me or talk. ABG ordered for patient.  10:13 PM ordered placed for transfer to ICU based off patients ABG results.

## 2021-11-29 NOTE — Progress Notes (Signed)
PHARMACIST - PHYSICIAN COMMUNICATION  DR:   Reesa Chew  CONCERNING: IV to Oral Route Change Policy  RECOMMENDATION: This patient is receiving Pantoprazole by the intravenous route.  Based on criteria approved by the Pharmacy and Therapeutics Committee, the intravenous medication(s) is/are being converted to the equivalent oral dose form(s).   DESCRIPTION: These criteria include: The patient is eating (either orally or via tube) and/or has been taking other orally administered medications for a least 24 hours The patient has no evidence of active gastrointestinal bleeding or impaired GI absorption (gastrectomy, short bowel, patient on TNA or NPO).  If you have questions about this conversion, please contact the Pharmacy Department  '[]'$   [phone] )  [facility] '[x]'$   [phone] )  [facility] '[]'$   [phone] )  [facility] '[]'$   [phone] )  [facility] '[]'$   [phone] )  Kellerton Rodriguez-Guzman PharmD, BCPS 11/29/2021 10:15 AM

## 2021-11-29 NOTE — TOC Progression Note (Signed)
Transition of Care [facility]) - Progression Note    Patient Details  Name: Gary [name] MRN: [id number] Date of Birth: 11/19/1955  Transition of [facility]) CM/SW Contact  Beverly Sessions, RN Phone Number: 11/29/2021, 3:38 PM  Clinical Narrative:     - Spoke with sister [name].  She states that none of the sisters are going to be able to stay with patient at discharge.  They can come by and check on him, but not stay with him, nor him stay with them.  She states that [name] (his girlfriend) will have to be the one that is educated as she will be living with him - Voicemail left for [name] to discuss discharge planning and trach education - Spoke with RT, she states they typically do trach education 48-72 hours before discharge, so the education is fresh in their memory    Expected Discharge Plan: Long Term Acute Care (LTAC) Barriers to Discharge: Continued Medical Work up  Expected Discharge Plan and Services Expected Discharge Plan: Long Term Acute Care (LTAC)   Discharge Planning Services: CM Consult Post Acute Care Choice: Long Term Acute Care (LTAC) Living arrangements for the past 2 months: Single Family Home                 DME Arranged: N/A DME Agency: NA       HH Arranged: NA HH Agency: NA         Social Determinants of Health (SDOH) Interventions    Readmission Risk Interventions    05/04/2021    1:26 PM 04/07/2021    1:39 PM 03/12/2021   11:54 AM  Readmission Risk Prevention Plan  Transportation Screening Complete Complete Complete  PCP or Specialist Appt within 3-5 Days  Complete Complete  HRI or Home Care Consult   Complete  Social Work Consult for [place] Planning/Counseling   Complete  Palliative Care Screening  Not Applicable Not Applicable  Medication Review Press photographer) Complete Complete Complete  PCP or Specialist appointment within 3-5 days of discharge Complete    HRI or [place] Complete    SW Recovery Care/Counseling  Consult Complete    [place] Not Applicable

## 2021-11-29 NOTE — Progress Notes (Signed)
Occupational Therapy Treatment Patient Details Name: Gary [name] MRN: [id number] DOB: 08-24-1955 Today's Date: 11/29/2021   History of present illness Pt is a 66 y/o male admitted mid June secondary to SOB, COPD exacerbation, acute metabolic encephalopathy, acute on chronic hypoxic and hypercapnic respiratory failure in setting of acute decompensated HFpEF and AECOPD requiring intubation and mechanical ventilation. The pt is now s/p tracheostomy and PEG tube placement. New medical complications including GI bleed and multiple patient refusuals.   OT comments  Pt seen for skilled co-tx with PTA. Pt seated in recliner chair and agreeable to attempt transfer back to  bed with use of slide board. Pt needing set up A to place slide board and min A of 2 to manage board and B knee blocking for safety. Pt needing mod - max multimodal cuing for hand placement and technique but is able to transfer back to EOB. Mod A of 2 for sit >supine and pt utilized bed functionals to position himself further up in bed. Lunch tray set up in front of him with all needs within reach before exiting the room.    Recommendations for follow up therapy are one component of a multi-disciplinary discharge planning process, led by the attending physician.  Recommendations may be updated based on patient status, additional functional criteria and insurance authorization.    Follow Up Recommendations  Skilled nursing-short term rehab (<3 hours/day)    Assistance Recommended at Discharge Frequent or constant Supervision/Assistance  Patient can return home with the following  Two people to help with walking and/or transfers;Assistance with cooking/housework;Direct supervision/assist for medications management;Assist for transportation;Two people to help with bathing/dressing/bathroom   Equipment Recommendations  Other (comment) (hoyer lift, hoyer pad, and hospital bed if returning home)       Precautions / Restrictions  Precautions Precautions: Fall Precaution Comments: IJ, G tube, trach Restrictions Weight Bearing Restrictions: No Other Position/Activity Restrictions: Paring of blood blister on LLE 11/03/21; Per Dr Luana Shu no weight bearing restrictions on LLE       Mobility Bed Mobility Overal bed mobility: Needs Assistance Bed Mobility: Sit to Supine       Sit to supine: +2 for physical assistance, Mod assist        Transfers Overall transfer level: Needs assistance   Transfers: Bed to chair/wheelchair/BSC            Lateral/Scoot Transfers: With slide board, Min assist, +2 physical assistance General transfer comment: Min A of 2 for slide board placement and B knee blocking for safety. Increased cuing for technique and hand placement.     Balance Overall balance assessment: Needs assistance Sitting-balance support: Feet unsupported, Bilateral upper extremity supported Sitting balance-[name] Scale: Good                                     ADL either performed or assessed with clinical judgement   ADL Overall ADL's : Needs assistance/impaired Eating/Feeding: Set up;Sitting                                            Cognition Arousal/Alertness: Awake/alert Behavior During Therapy: WFL for tasks assessed/performed Overall Cognitive Status: Within Functional Limits for tasks assessed  General Comments: Pt is alert and oriented x4, increased time and encouragement for task completion                   Pertinent Vitals/ Pain       Pain Assessment Pain Assessment: Faces Faces Pain Scale: No hurt         Frequency  Min 2X/week        Progress Toward Goals  OT Goals(current goals can now be found in the care plan section)  Progress towards OT goals: Progressing toward goals  Acute Rehab OT Goals Patient Stated Goal: to get stronger and go home OT Goal Formulation: With patient Time  For Goal Achievement: 12/06/21 Potential to Achieve Goals: Good  Plan Discharge plan remains appropriate;Frequency remains appropriate    Co-evaluation    PT/OT/SLP Co-Evaluation/Treatment: Yes Reason for Co-Treatment: Complexity of the patient's impairments (multi-system involvement);Necessary to address cognition/behavior during functional activity;For patient/therapist safety;To address functional/ADL transfers PT goals addressed during session: Mobility/safety with mobility;Balance OT goals addressed during session: ADL's and self-care      AM-PAC OT "6 Clicks" Daily Activity     Outcome Measure   Help from another person eating meals?: None Help from another person taking care of personal grooming?: A Little Help from another person toileting, which includes using toliet, bedpan, or urinal?: Total Help from another person bathing (including washing, rinsing, drying)?: A Lot Help from another person to put on and taking off regular upper body clothing?: A Little Help from another person to put on and taking off regular lower body clothing?: A Lot 6 Click Score: 15    End of Session Equipment Utilized During Treatment: Other (comment) (trach)  OT Visit Diagnosis: Other abnormalities of gait and mobility (R26.89);Muscle weakness (generalized) (M62.81)   Activity Tolerance Patient tolerated treatment well   Patient Left in bed;with call bell/phone within reach   Nurse Communication Mobility status        Time: 1355-1420 OT Time Calculation (min): 25 min  Charges: OT General Charges $OT Visit: 1 Visit OT Treatments $Therapeutic Activity: 8-22 mins  Darleen Crocker, MS, OTR/L , CBIS ascom [phone]  11/29/21, 2:59 PM

## 2021-11-30 DIAGNOSIS — J9622 Acute and chronic respiratory failure with hypercapnia: Secondary | ICD-10-CM | POA: Diagnosis not present

## 2021-11-30 DIAGNOSIS — Z7189 Other specified counseling: Secondary | ICD-10-CM | POA: Diagnosis not present

## 2021-11-30 DIAGNOSIS — J9621 Acute and chronic respiratory failure with hypoxia: Secondary | ICD-10-CM | POA: Diagnosis not present

## 2021-11-30 LAB — BLOOD GAS, VENOUS
Acid-Base Excess: 13.4 mmol/L — ABNORMAL HIGH (ref 0.0–2.0)
Bicarbonate: 38.9 mmol/L — ABNORMAL HIGH (ref 20.0–28.0)
O2 Saturation: 68.6 %
Patient temperature: 37
pCO2, Ven: 51 mmHg (ref 44–60)
pH, Ven: 7.49 — ABNORMAL HIGH (ref 7.25–7.43)
pO2, Ven: 37 mmHg (ref 32–45)

## 2021-11-30 LAB — BASIC METABOLIC PANEL
Anion gap: 7 (ref 5–15)
BUN: 20 mg/dL (ref 8–23)
CO2: 33 mmol/L — ABNORMAL HIGH (ref 22–32)
Calcium: 8.9 mg/dL (ref 8.9–10.3)
Chloride: 101 mmol/L (ref 98–111)
Creatinine, Ser: 0.98 mg/dL (ref 0.61–1.24)
GFR, Estimated: >60 mL/min (ref >60)
Glucose, Bld: 77 mg/dL (ref 70–99)
Potassium: 4.3 mmol/L (ref 3.5–5.1)
Sodium: 141 mmol/L (ref 135–145)

## 2021-11-30 LAB — GLUCOSE, CAPILLARY: Glucose-Capillary: 121 mg/dL — ABNORMAL HIGH (ref 70–99)

## 2021-11-30 LAB — MRSA NEXT GEN BY PCR, NASAL: MRSA by PCR Next Gen: NOT DETECTED

## 2021-11-30 LAB — AMMONIA: Ammonia: 19 umol/L (ref 9–35)

## 2021-11-30 MED ORDER — FUROSEMIDE 10 MG/ML IJ SOLN
40.0000 mg | Freq: Three times a day (TID) | INTRAMUSCULAR | Status: DC
  Administered 2021-11-30 – 2021-12-01 (×4): 40 mg via INTRAVENOUS
  Filled 2021-11-30 (×4): qty 4

## 2021-11-30 MED ORDER — LORAZEPAM 2 MG/ML IJ SOLN
1.0000 mg | Freq: Once | INTRAMUSCULAR | Status: AC
Start: 2021-11-30 — End: 2021-11-30
  Administered 2021-11-30: 1 mg via INTRAVENOUS
  Filled 2021-11-30: qty 1
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
## 2021-11-30 NOTE — Progress Notes (Signed)
Physical Therapy Treatment Patient Details Name: Gary [name] MRN: [id number] DOB: 01-25-1956 Today's Date: 11/30/2021   History of Present Illness Pt is a 66 y/o male admitted mid June secondary to SOB, COPD exacerbation, acute metabolic encephalopathy, acute on chronic hypoxic and hypercapnic respiratory failure in setting of acute decompensated HFpEF and AECOPD requiring intubation and mechanical ventilation. The pt is now s/p tracheostomy and PEG tube placement. New medical complications including GI bleed.  8/16 he retuned to CCU and was vented due to hypercapnia.    PT Comments    Pt in CCU bed lethargic but able and willing to participate with some supine exercises.  He had been on the vent earlier today but saturating in the high 90s on trach collar. Per his usual, pt struggled with R LE quad strength against gravity and c/o pain in b/l feet (L>R) with attempts at lightly resisted pressure through plantar surface.  PT encouraged him to get to sitting at EOB, he states not today but indicated that he thinks he would like to "do that tomorrow."   Recommendations for follow up therapy are one component of a multi-disciplinary discharge planning process, led by the attending physician.  Recommendations may be updated based on patient status, additional functional criteria and insurance authorization.  Follow Up Recommendations  Home health PT (will need 24/7 assist) Can patient physically be transported by private vehicle: No   Assistance Recommended at Discharge Frequent or constant Supervision/Assistance  Patient can return home with the following Two people to help with walking and/or transfers;Two people to help with bathing/dressing/bathroom;Assistance with cooking/housework;Assist for transportation;Help with stairs or ramp for entrance   Equipment Recommendations   (per progress)    Recommendations for Other Services       Precautions / Restrictions Precautions Precautions:  Fall Precaution Comments: trach Restrictions Weight Bearing Restrictions: No     Mobility  Bed Mobility               General bed mobility comments: "I ain't sitting today, we can try tomorrow but not today"    Transfers                        Ambulation/Gait                   Stairs             Wheelchair Mobility    Modified Rankin (Stroke Patients Only)       Balance                                            Cognition Arousal/Alertness: Awake/alert (more distant and lethargic than most prior session with this therapist, but able to appropriate participate) Behavior During Therapy: WFL for tasks assessed/performed Overall Cognitive Status: Within Functional Limits for tasks assessed                                          Exercises Total Joint Exercises Ankle Circles/Pumps: AROM, Strengthening, 20 reps (resisted on R, does not tolerate resist on L due to pain) Quad Sets: Strengthening, 10 reps Short Arc Quad: AROM, AAROM, Both, 10 reps, 15 reps (AROM on L, cannot lift R against gravity (AAROM only)) Heel Slides: AAROM, Both,  10 reps, AROM (with resisted leg extq) Hip ABduction/ADduction: AAROM, Strengthening, Both, 10 reps, AROM (struggled to use just hip for R side slides (AAROM only), AROM/light resist on L)    General Comments        Pertinent Vitals/Pain Pain Assessment Pain Assessment: Faces Faces Pain Scale: Hurts little more Pain Location: c/o pain in feet L>R    Home Living                          Prior Function            PT Goals (current goals can now be found in the care plan section) Progress towards PT goals: Not progressing toward goals - comment (pt back to the CCU, temporarily vented, has been able to do only limited mobility tasks the last few weeks)    Frequency    Min 2X/week      PT Plan Current plan remains appropriate    Co-evaluation               AM-PAC PT "6 Clicks" Mobility   Outcome Measure  Help needed turning from your back to your side while in a flat bed without using bedrails?: A Lot Help needed moving from lying on your back to sitting on the side of a flat bed without using bedrails?: A Lot Help needed moving to and from a bed to a chair (including a wheelchair)?: Total Help needed standing up from a chair using your arms (e.g., wheelchair or bedside chair)?: A Lot Help needed to walk in hospital room?: Total Help needed climbing 3-5 steps with a railing? : Total 6 Click Score: 9    End of Session Equipment Utilized During Treatment: Oxygen Activity Tolerance: Patient tolerated treatment well Patient left: in bed;with call bell/phone within reach Nurse Communication: Mobility status PT Visit Diagnosis: Unsteadiness on feet (R26.81);Muscle weakness (generalized) (M62.81);Difficulty in walking, not elsewhere classified (R26.2)     Time: 0277-4128 PT Time Calculation (min) (ACUTE ONLY): 28 min  Charges:  $Therapeutic Exercise: 23-37 mins                     Kreg Shropshire, DPT 11/30/2021, 2:42 PM

## 2021-11-30 NOTE — Progress Notes (Signed)
Physical Therapy Discharge Patient Details Name: Gary [name] MRN: [id number] DOB: [birth year] Today's Date: 11/30/2021 Time:  -     Patient discharged from PT services secondary to medical decline - will need to re-order PT to resume therapy services.Pt was transferred to ICU and place on ventilator.   Please see latest therapy progress note for current level of functioning and progress toward goals.        [name] 11/30/2021, 7:19 AM

## 2021-11-30 NOTE — Progress Notes (Addendum)
Daily Progress Note   Patient Name: Gary [name]       Date: 11/30/2021 DOB: 1955/09/27  Age: 66 y.o. MRN#: [id number] Attending Physician: Candee Furbish, MD Primary Care Physician: [facility] Date: 09/19/2021  Reason for Consultation/Follow-up: Establishing goals of care  Subjective: Notes and labs reviewed. Patient is upset today stating he does not need a ventilator at night, and is only back in ICU because he needs his PMV in order to cough, but was not able to use it which caused a build up of secretions. Attempted to discuss PMV use with respiratory distress. He became more upset stating that I was siding with "him". We discussed care moving forward and dispo. I attempted to discuss safe D/C and he stated "I'm going home at the end of the month, and that's 2 weeks". He states he has business to attend to at home. He confirms full code/full scope.   Length of Stay: 72  Current Medications: Scheduled Meds:   arformoterol  15 mcg Nebulization BID   vitamin C  500 mg Oral BID   atorvastatin  10 mg Oral Daily   Chlorhexidine Gluconate Cloth  6 each Topical Daily   Chlorhexidine Gluconate Cloth  6 each Topical Q0600   cholecalciferol  2,000 Units Oral Daily   escitalopram  5 mg Oral Daily   ferrous [id number] C-folic acid  1 capsule Oral BID PC   furosemide  40 mg Intravenous Q8H   gabapentin  300 mg Oral Q8H   lidocaine  1 patch Transdermal Daily   melatonin  5 mg Oral QHS   multivitamin with minerals  1 tablet Oral Daily   pantoprazole  40 mg Oral BID AC   sodium chloride flush  3 mL Intravenous Q12H    Continuous Infusions:  sodium chloride Stopped (10/07/21 0542)   sodium chloride      PRN Meds: sodium chloride, acetaminophen,  albuterol, benzonatate, bisacodyl, clonazepam, hydrOXYzine, loperamide, Muscle Rub, nitroGLYCERIN, ondansetron (ZOFRAN) IV, mouth rinse, pentafluoroprop-tetrafluoroeth, polyethylene glycol, QUEtiapine, sodium chloride flush, traMADol  Physical Exam Pulmonary:     Comments: TC and PMV in place.  Neurological:     Mental Status: He is alert.             Vital Signs: BP 116/79   Pulse 91  Temp 98.6 F (37 C) (Axillary)   Resp (!) 22   Ht '6\' 3"'$  (1.905 m)   Wt (!) 194 kg   SpO2 100%   BMI 53.46 kg/m  SpO2: SpO2: 100 % O2 Device: O2 Device: Tracheostomy Collar O2 Flow Rate: O2 Flow Rate (L/min): 5 L/min  Intake/output summary:  Intake/Output Summary (Last 24 hours) at 11/30/2021 1614 Last data filed at 11/30/2021 1000 Gross per 24 hour  Intake --  Output 1700 ml  Net -1700 ml   LBM: Last BM Date : 11/28/21 Baseline Weight: Weight: (!) 171 kg Most recent weight: Weight: (!) 194 kg     Patient Active Problem List   Diagnosis Date Noted   Hematochezia    Bloody stool 11/12/2021   Major depressive disorder, recurrent episode, moderate ([place]) 10/22/2021   Pressure injury of skin 09/27/2021   Acute on chronic respiratory failure with hypercapnia ([name]) 08/07/2021   COPD (chronic obstructive pulmonary disease) ([name])    HLD (hyperlipidemia)    Iron deficiency anemia    Chronic kidney disease, stage 3a ([place]) 07/22/2021   Acute cardiac pulmonary edema ([place]) 07/22/2021   Acute on chronic respiratory failure with hypoxia and hypercapnia ([name]) 07/20/2021   Chronic obstructive pulmonary disease ([name])    Positive D dimer    CHF exacerbation ([place]) 05/03/2021   Acute CHF (congestive heart failure) ([place]) 04/04/2021   Weakness    Pneumonia 03/11/2021   Acute respiratory failure with hypoxia and hypercapnia ([place]) 03/11/2021   (HFpEF) heart failure with preserved ejection fraction ([name])    Chronic respiratory failure with hypoxia ([name])    Hyperlipidemia 02/19/2021   Acute respiratory  distress syndrome (ARDS) due to COVID-19 virus ([place])    Acute on chronic respiratory failure ([place]) 02/18/2021   Generalized osteoarthritis of multiple sites 10/31/2020   AKI (acute kidney injury) ([place]) 03/04/2020   COPD with acute exacerbation ([place]) 03/02/2020   Marijuana use 01/17/2020   Thrombocytopenia ([place]) 01/17/2020   Positive hepatitis C antibody test 01/17/2020   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 mass index (BMI) of 50.0 to 59.9 in adult [facility]) 01/17/2020   Osteoarthritis of glenohumeral joints (Bilateral) 01/17/2020   Osteoarthritis of AC (acromioclavicular) joints (Bilateral) 01/17/2020   Polysubstance abuse ([place]) 29/56/2130   Acute metabolic encephalopathy 86/57/8469   Chronic pain syndrome 07/14/2019   DDD (degenerative disc disease), lumbosacral 07/14/2019   DDD (degenerative disc disease), cervical 07/14/2019   CHF (congestive heart failure) ([place]) 04/12/2017   Acute on chronic diastolic CHF (congestive heart failure) ([place]) 02/28/2015   Obstructive sleep apnea 02/28/2015   Acute on chronic diastolic congestive heart failure ([name])    Acute on chronic respiratory failure with hypoxia ([name])    Obesity, Class III, BMI 40-49.9 (morbid obesity) ([place]) 04/01/2014   HTN (hypertension) 04/01/2014    Palliative Care Assessment & Plan    Recommendations/Plan: Confirms full code/full scope.   PMT will shadow peripherally.   Code Status:    Code Status Orders  (From admission, onward)           Start     Ordered   09/19/21 1142  Full code  Continuous        09/19/21 1142           Code Status History     Date Active Date Inactive Code Status Order ID Comments User Context   08/07/2021 1057 08/15/2021 2025 Full Code [id number]  Ivor Costa, MD ED   07/20/2021 1625 07/23/2021 1921 Full Code  [id number]  Bradly Bienenstock, NP ED   05/03/2021 2155 05/08/2021 1908 Full Code [id number]  Athena Masse, MD ED   04/04/2021 1923 04/12/2021 0031 Full Code  [id number]  Mansy, Arvella Merles, MD ED   03/11/2021 0156 03/21/2021 2310 Full Code [id number]  Athena Masse, MD Inpatient   02/18/2021 2114 02/23/2021 0759 Full Code [id number]  Rust-[place], Huel Cote, NP ED   02/25/2020 1310 03/05/2020 2206 Full Code [id number]  [name], MD ED   08/04/2019 1910 08/10/2019 2239 Full Code [id number]  Mansy, Arvella Merles, MD ED   05/26/2019 1806 06/01/2019 1711 Full Code [id number]  Loletha Grayer, MD ED   10/14/2018 0330 10/22/2018 1951 Full Code [id number]  Lance Coon, MD Inpatient   06/25/2018 0026 06/28/2018 1842 Full Code [id number]  Lance Coon, MD ED   05/29/2018 0001 05/31/2018 1732 Full Code [id number]  Vaughan Basta, MD Inpatient   04/12/2017 1353 04/15/2017 1720 Full Code [id number]  Fritzi Mandes, MD Inpatient   01/10/2015 1953 01/14/2015 1703 Full Code [id number]  Aldean Jewett, MD Inpatient   03/27/2014 1847 04/02/2014 1512 Full Code [id number]  Doreen Salvage, MD ED       Thank you for allowing the Palliative Medicine Team to assist in the care of this patient.  Asencion Gowda, NP  Please contact Palliative Medicine Team phone at [phone] for questions and concerns.

## 2021-11-30 NOTE — TOC Progression Note (Signed)
Transition of Care [facility]) - Progression Note    Patient Details  Name: Gary [name] MRN: [id number] Date of Birth: 10/18/1955  Transition of Care [facility]) CM/SW Contact  Shelbie Hutching, RN Phone Number: 11/30/2021, 10:57 AM  Clinical Narrative:    Patient is currently on the ventilator in the ICU.  No family is present at the bedside and patient is lethargic this morning.    Expected Discharge Plan: Long Term Acute Care (LTAC) Barriers to Discharge: Continued Medical Work up  Expected Discharge Plan and Services Expected Discharge Plan: Long Term Acute Care (LTAC)   Discharge Planning Services: CM Consult Post Acute Care Choice: Long Term Acute Care (LTAC) Living arrangements for the past 2 months: Single Family Home                 DME Arranged: N/A DME Agency: NA       HH Arranged: NA HH Agency: NA         Social Determinants of Health (SDOH) Interventions    Readmission Risk Interventions    05/04/2021    1:26 PM 04/07/2021    1:39 PM 03/12/2021   11:54 AM  Readmission Risk Prevention Plan  Transportation Screening Complete Complete Complete  PCP or Specialist Appt within 3-5 Days  Complete Complete  HRI or [place]   Complete  Social Work Consult for [place] Planning/Counseling   Complete  Palliative Care Screening  Not Applicable Not Applicable  Medication Review Press photographer) Complete Complete Complete  PCP or Specialist appointment within 3-5 days of discharge Complete    HRI or [place] Complete    SW Recovery Care/Counseling Consult Complete    [place] Not Applicable

## 2021-12-01 DIAGNOSIS — J9622 Acute and chronic respiratory failure with hypercapnia: Secondary | ICD-10-CM | POA: Diagnosis not present

## 2021-12-01 DIAGNOSIS — J9621 Acute and chronic respiratory failure with hypoxia: Secondary | ICD-10-CM | POA: Diagnosis not present

## 2021-12-01 LAB — BASIC METABOLIC PANEL
Anion gap: 9 (ref 5–15)
BUN: 28 mg/dL — ABNORMAL HIGH (ref 8–23)
CO2: 34 mmol/L — ABNORMAL HIGH (ref 22–32)
Calcium: 8.5 mg/dL — ABNORMAL LOW (ref 8.9–10.3)
Chloride: 97 mmol/L — ABNORMAL LOW (ref 98–111)
Creatinine, Ser: 1.25 mg/dL — ABNORMAL HIGH (ref 0.61–1.24)
GFR, Estimated: >60 mL/min (ref >60)
Glucose, Bld: 78 mg/dL (ref 70–99)
Potassium: 4 mmol/L (ref 3.5–5.1)
Sodium: 140 mmol/L (ref 135–145)

## 2021-12-01 LAB — PHOSPHORUS: Phosphorus: 4.2 mg/dL (ref 2.5–4.6)

## 2021-12-01 LAB — MAGNESIUM: Magnesium: 2.1 mg/dL (ref 1.7–2.4)

## 2021-12-01 MED ORDER — TORSEMIDE 20 MG PO TABS
20.0000 mg | ORAL_TABLET | Freq: Every day | ORAL | Status: DC
  Administered 2021-12-02 – 2021-12-13 (×12): 20 mg via ORAL
  Filled 2021-12-01 (×12): qty 1

## 2021-12-01 MED ORDER — BUDESONIDE 0.5 MG/2ML IN SUSP
0.5000 mg | Freq: Two times a day (BID) | RESPIRATORY_TRACT | Status: DC
  Administered 2021-12-01 – 2021-12-13 (×25): 0.5 mg via RESPIRATORY_TRACT
  Filled 2021-12-01 (×25): qty 2

## 2021-12-01 MED ORDER — ENOXAPARIN SODIUM 40 MG/0.4ML IJ SOSY
40.0000 mg | PREFILLED_SYRINGE | INTRAMUSCULAR | Status: DC
  Administered 2021-12-01 – 2021-12-03 (×3): 40 mg via SUBCUTANEOUS
  Filled 2021-12-01 (×3): qty 0.4

## 2021-12-01 MED ORDER — REVEFENACIN 175 MCG/3ML IN SOLN
175.0000 ug | Freq: Every day | RESPIRATORY_TRACT | Status: DC
  Administered 2021-12-01 – 2021-12-13 (×13): 175 ug via RESPIRATORY_TRACT
  Filled 2021-12-01 (×13): qty 3

## 2021-12-01 NOTE — Progress Notes (Signed)
Physical Therapy Treatment Patient Details Name: Gary [name] MRN: [id number] DOB: 01/30/1956 Today's Date: 12/01/2021   History of Present Illness Pt is a 66 y/o male admitted mid June secondary to SOB, COPD exacerbation, acute metabolic encephalopathy, acute on chronic hypoxic and hypercapnic respiratory failure in setting of acute decompensated HFpEF and AECOPD requiring intubation and mechanical ventilation. The pt is now s/p tracheostomy and PEG tube placement. New medical complications including GI bleed.  8/16 he retuned to CCU and was vented due to hypercapnia.    PT Comments    PT/OT co-treat 2/2 to pt requiring +2 assistance and limited activity tolerance. Pt just finished visiting with girlfriend and sister/niece. He agrees to session with encouragement however endorses feeling weak throughout session. He need constant encouragement to participate but did participate some. " I will get in the chair tomorrow." Recommend Hoyer lift transfer to bariatric recliner. He was able to tolerate rolling R to short sit EOB. Pt sat EOB x > 25 minutes. Did perform exercises while EOB prior to retuning to supine. OT/PT assisted pt with hygiene care after BM. Pt will need continued skilled PT to maximize independence while decreasing caregiver burden.    Recommendations for follow up therapy are one component of a multi-disciplinary discharge planning process, led by the attending physician.  Recommendations may be updated based on patient status, additional functional criteria and insurance authorization.  Follow Up Recommendations  Home health PT     Assistance Recommended at Discharge Frequent or constant Supervision/Assistance  Patient can return home with the following Two people to help with walking and/or transfers;Two people to help with bathing/dressing/bathroom;Assistance with cooking/housework;Assist for transportation;Help with stairs or ramp for entrance   Equipment Recommendations   None recommended by PT       Precautions / Restrictions Precautions Precautions: Fall Precaution Comments: trach Restrictions Weight Bearing Restrictions: No     Mobility  Bed Mobility Overal bed mobility: Needs Assistance Bed Mobility: Supine to Sit, Sit to Supine Rolling: Mod assist (+ max vsc and encouragement)   Supine to sit: Mod assist, +2 for physical assistance Sit to supine: Mod assist, +2 for physical assistance   General bed mobility comments: Pt was able to roll R to short sit with increased time + 2 assist for safety. pt needs increased time to perform all task    Transfers  General transfer comment: pt unwilling." I'll do it tomorrow, I'm just too weak today." max encouragement but pt remained unwilling      Balance Overall balance assessment: Needs assistance Sitting-balance support: Feet unsupported, Bilateral upper extremity supported, Single extremity supported Sitting balance-[name] Scale: Good Sitting balance - Comments: no assistance while seated EOB with feet support only       Cognition Arousal/Alertness: Awake/alert Behavior During Therapy: WFL for tasks assessed/performed Overall Cognitive Status: Within Functional Limits for tasks assessed Area of Impairment: Safety/judgement, Awareness    Orientation Level: Disoriented to, Time, Situation     Following Commands: Follows one step commands with increased time, Follows one step commands consistently Safety/Judgement: Decreased awareness of safety, Decreased awareness of deficits Awareness: Intellectual Problem Solving: Slow processing, Requires verbal cues, Requires tactile cues General Comments: Pt is alert and oriented x4, increased time and encouragement for task completion        Exercises      General Comments General comments (skin integrity, edema, etc.): pt was able to perform 2 x 10 BLE LAQ in sitting. Pt also able to I'ly slide to [facility] and reposition  with bed in trendelenburg         PT Goals (current goals can now be found in the care plan section) Acute Rehab PT Goals Patient Stated Goal: go home Progress towards PT goals: Progressing toward goals    Frequency    Min 2X/week      PT Plan Current plan remains appropriate    Co-evaluation   Reason for Co-Treatment: Complexity of the patient's impairments (multi-system involvement);For patient/therapist safety;To address functional/ADL transfers PT goals addressed during session: Mobility/safety with mobility;Balance;Proper use of DME;Strengthening/ROM OT goals addressed during session: ADL's and self-care;Strengthening/ROM      AM-PAC PT "6 Clicks" Mobility   Outcome Measure  Help needed turning from your back to your side while in a flat bed without using bedrails?: A Lot Help needed moving from lying on your back to sitting on the side of a flat bed without using bedrails?: A Lot Help needed moving to and from a bed to a chair (including a wheelchair)?: Total Help needed standing up from a chair using your arms (e.g., wheelchair or bedside chair)?: A Lot Help needed to walk in hospital room?: Total Help needed climbing 3-5 steps with a railing? : Total 6 Click Score: 9    End of Session Equipment Utilized During Treatment: Oxygen (trach collar throughout) Activity Tolerance: Patient tolerated treatment well Patient left: in bed;with call bell/phone within reach Nurse Communication: Mobility status PT Visit Diagnosis: Unsteadiness on feet (R26.81);Muscle weakness (generalized) (M62.81);Difficulty in walking, not elsewhere classified (R26.2)     Time: 2482-5003 PT Time Calculation (min) (ACUTE ONLY): 40 min  Charges:  $Therapeutic Activity: 8-22 mins                    Julaine Fusi PTA 12/01/21, 5:22 PM

## 2021-12-01 NOTE — Progress Notes (Signed)
Occupational Therapy Treatment Patient Details Name: Gary [name] MRN: [id number] DOB: 1956/02/13 Today's Date: 12/01/2021   History of present illness Pt is a 66 y/o male admitted mid June secondary to SOB, COPD exacerbation, acute metabolic encephalopathy, acute on chronic hypoxic and hypercapnic respiratory failure in setting of acute decompensated HFpEF and AECOPD requiring intubation and mechanical ventilation. The pt is now s/p tracheostomy and PEG tube placement. New medical complications including GI bleed.  8/16 he retuned to CCU and was vented due to hypercapnia.   OT comments  Gary [name] presents with generalized weakness, limited endurance, dysnpea, and pain. As of 8/17, he has been off ventilator and now using trach. During today's evaluation, met with family members (significant other, sister, niece), provided educ re: pt's care needs at home, made arrangement for ongoing educ next week. Pt able to come to EOB sitting with Mod A for repositioning b/l LE and trunk support. Pt able to maintain good sitting posture without physical assistance for 15-20 minutes while engaging in grooming, bathing, and UB dressing tasks. Pt has BM in bed during session, requires Max A for pericare, able to assist by rolling to left and right in bed, reaching cross-laterally and pulling against bed rails. Pt able to reposition self towards HOB in supine, reaching overhead with b/l UE and pulling on headboard, with MOD I. Despite repeated encouragement, pt unwilling to attempt sit<stand, saying he feels "too weak." Have updated DC recs to [facility] and 24/7 supervision.   Recommendations for follow up therapy are one component of a multi-disciplinary discharge planning process, led by the attending physician.  Recommendations may be updated based on patient status, additional functional criteria and insurance authorization.    Follow Up Recommendations  Home health OT    Assistance Recommended at Discharge  Frequent or constant Supervision/Assistance  Patient can return home with the following  Two people to help with walking and/or transfers;Assistance with cooking/housework;Direct supervision/assist for medications management;Assist for transportation;Two people to help with bathing/dressing/bathroom;Help with stairs or ramp for entrance   Equipment Recommendations  Other (comment) (hoyer lift, hoyer pad, hospital bed)    Recommendations for Other Services      Precautions / Restrictions Precautions Precautions: Fall Precaution Comments: trach Restrictions Weight Bearing Restrictions: No       Mobility Bed Mobility Overal bed mobility: Needs Assistance Bed Mobility: Sit to Supine, Supine to Sit, Rolling Rolling: Mod assist   Supine to sit: Mod assist, +2 for physical assistance Sit to supine: +2 for physical assistance, Mod assist        Transfers                   General transfer comment: Despite repeated encouragement, pt refuses to attempt sit<stand today, stating he is "too weak."     Balance Overall balance assessment: Needs assistance Sitting-balance support: Feet unsupported, Bilateral upper extremity supported, Single extremity supported Sitting balance-[name] Scale: Good Sitting balance - Comments: seated on edge of bed for 15+ minutes with good dynamic sitting balance                                   ADL either performed or assessed with clinical judgement   ADL Overall ADL's : Needs assistance/impaired     Grooming: Wash/dry hands;Wash/dry face;Sitting;Set up;Supervision/safety Grooming Details (indicate cue type and reason): Pt able to wash with wet washcloth sitting at EOB; requires repeated encouragement for task continuation  Upper Body Bathing: Set up;Supervision/ safety Upper Body Bathing Details (indicate cue type and reason): seated EOB     Upper Body Dressing : Minimal assistance;Sitting   Lower Body Dressing: Maximal  assistance Lower Body Dressing Details (indicate cue type and reason): socks     Toileting- Clothing Manipulation and Hygiene: Maximal assistance Toileting - Clothing Manipulation Details (indicate cue type and reason): Max A for pericare following BM in bed. Pt able to assist by rolling side to side at bed level     Functional mobility during ADLs: +2 for safety/equipment;+2 for physical assistance;Moderate assistance General ADL Comments: TOTAL A for peri care following BM    Extremity/Trunk Assessment Upper Extremity Assessment Upper Extremity Assessment: Generalized weakness;LUE deficits/detail LUE Deficits / Details: LUE ~ 90 degree flexion at baseline LUE: Shoulder pain with ROM   Lower Extremity Assessment Lower Extremity Assessment: Generalized weakness;RLE deficits/detail;LLE deficits/detail RLE Sensation: history of peripheral neuropathy LLE Sensation: history of peripheral neuropathy        Vision       Perception     Praxis      Cognition Arousal/Alertness: Awake/alert Behavior During Therapy: WFL for tasks assessed/performed Overall Cognitive Status: Within Functional Limits for tasks assessed                                 General Comments: Pt is alert and oriented x4, increased time and encouragement for task completion        Exercises Other Exercises Other Exercises: Provided educ re: importance of standing, transferring, OOB mobility. Family educ re: care needs post DC    Shoulder Instructions       General Comments      Pertinent Vitals/ Pain       Pain Assessment Pain Assessment: No/denies pain  Home Living                                          Prior Functioning/Environment              Frequency  Min 2X/week        Progress Toward Goals  OT Goals(current goals can now be found in the care plan section)  Progress towards OT goals: Progressing toward goals  Acute Rehab OT Goals Patient  Stated Goal: to get stronger and go home OT Goal Formulation: With patient Time For Goal Achievement: 12/15/21 Potential to Achieve Goals: Good  Plan Discharge plan needs to be updated;Frequency remains appropriate    Co-evaluation    PT/OT/SLP Co-Evaluation/Treatment: Yes Reason for Co-Treatment: Complexity of the patient's impairments (multi-system involvement);For patient/therapist safety;To address functional/ADL transfers PT goals addressed during session: Mobility/safety with mobility;Balance;Strengthening/ROM OT goals addressed during session: ADL's and self-care;Strengthening/ROM      AM-PAC OT "6 Clicks" Daily Activity     Outcome Measure   Help from another person eating meals?: A Little Help from another person taking care of personal grooming?: A Little Help from another person toileting, which includes using toliet, bedpan, or urinal?: A Lot Help from another person bathing (including washing, rinsing, drying)?: A Lot Help from another person to put on and taking off regular upper body clothing?: A Little Help from another person to put on and taking off regular lower body clothing?: A Lot 6 Click Score: 15    End of Session Equipment Utilized During  Treatment: Other (comment);Oxygen (trach collar)  OT Visit Diagnosis: Other abnormalities of gait and mobility (R26.89);Muscle weakness (generalized) (M62.81);Pain   Activity Tolerance Patient tolerated treatment well   Patient Left in bed;with call bell/phone within reach   Nurse Communication Mobility status        Time: 1450-1538 OT Time Calculation (min): 48 min  Charges: OT General Charges $OT Visit: 1 Visit OT Evaluation $OT Re-eval: 1 Re-eval OT Treatments $Self Care/Home Management : 23-37 mins  [name], PhD, MS, OTR/L 12/01/21, 4:59 PM
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
## 2021-12-01 NOTE — Progress Notes (Signed)
Patient and RN instructed to contact RRT when patient is ready to go to sleep so RRT can place patient on ventilator.

## 2021-12-02 DIAGNOSIS — J9622 Acute and chronic respiratory failure with hypercapnia: Secondary | ICD-10-CM | POA: Diagnosis not present

## 2021-12-02 DIAGNOSIS — J9621 Acute and chronic respiratory failure with hypoxia: Secondary | ICD-10-CM | POA: Diagnosis not present

## 2021-12-02 LAB — MAGNESIUM: Magnesium: 2.2 mg/dL (ref 1.7–2.4)

## 2021-12-02 LAB — CBC
HCT: 29.4 % — ABNORMAL LOW (ref 39.0–52.0)
Hemoglobin: 8 g/dL — ABNORMAL LOW (ref 13.0–17.0)
MCH: 22.9 pg — ABNORMAL LOW (ref 26.0–34.0)
MCHC: 27.2 g/dL — ABNORMAL LOW (ref 30.0–36.0)
MCV: 84 fL (ref 80.0–100.0)
Platelets: 186 10*3/uL (ref 150–400)
RBC: 3.5 MIL/uL — ABNORMAL LOW (ref 4.22–5.81)
RDW: 20 % — ABNORMAL HIGH (ref 11.5–15.5)
WBC: 6.8 10*3/uL (ref 4.0–10.5)
nRBC: 0.6 % — ABNORMAL HIGH (ref 0.0–0.2)

## 2021-12-02 LAB — BASIC METABOLIC PANEL
Anion gap: 7 (ref 5–15)
BUN: 31 mg/dL — ABNORMAL HIGH (ref 8–23)
CO2: 33 mmol/L — ABNORMAL HIGH (ref 22–32)
Calcium: 8.3 mg/dL — ABNORMAL LOW (ref 8.9–10.3)
Chloride: 100 mmol/L (ref 98–111)
Creatinine, Ser: 1.15 mg/dL (ref 0.61–1.24)
GFR, Estimated: >60 mL/min (ref >60)
Glucose, Bld: 97 mg/dL (ref 70–99)
Potassium: 4 mmol/L (ref 3.5–5.1)
Sodium: 140 mmol/L (ref 135–145)

## 2021-12-02 LAB — PHOSPHORUS: Phosphorus: 3.9 mg/dL (ref 2.5–4.6)

## 2021-12-02 MED ORDER — ORAL CARE MOUTH RINSE
15.0000 mL | OROMUCOSAL | Status: DC
  Administered 2021-12-02 – 2021-12-12 (×36): 15 mL via OROMUCOSAL

## 2021-12-02 MED ORDER — ORAL CARE MOUTH RINSE: 15.0000 mL | OROMUCOSAL | Status: DC | PRN
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
## 2021-12-03 DIAGNOSIS — J9622 Acute and chronic respiratory failure with hypercapnia: Secondary | ICD-10-CM | POA: Diagnosis not present

## 2021-12-03 DIAGNOSIS — J9621 Acute and chronic respiratory failure with hypoxia: Secondary | ICD-10-CM | POA: Diagnosis not present

## 2021-12-03 LAB — CBC WITH DIFFERENTIAL/PLATELET
Abs Immature Granulocytes: 0.02 10*3/uL (ref 0.00–0.07)
Basophils Absolute: 0 10*3/uL (ref 0.0–0.1)
Basophils Relative: 0 %
Eosinophils Absolute: 0.2 10*3/uL (ref 0.0–0.5)
Eosinophils Relative: 3 %
HCT: 32 % — ABNORMAL LOW (ref 39.0–52.0)
Hemoglobin: 8.5 g/dL — ABNORMAL LOW (ref 13.0–17.0)
Immature Granulocytes: 0 %
Lymphocytes Relative: 15 %
Lymphs Abs: 1 10*3/uL (ref 0.7–4.0)
MCH: 22.5 pg — ABNORMAL LOW (ref 26.0–34.0)
MCHC: 26.6 g/dL — ABNORMAL LOW (ref 30.0–36.0)
MCV: 84.7 fL (ref 80.0–100.0)
Monocytes Absolute: 0.6 10*3/uL (ref 0.1–1.0)
Monocytes Relative: 9 %
Neutro Abs: 4.8 10*3/uL (ref 1.7–7.7)
Neutrophils Relative %: 73 %
Platelets: 186 10*3/uL (ref 150–400)
RBC: 3.78 MIL/uL — ABNORMAL LOW (ref 4.22–5.81)
RDW: 19.9 % — ABNORMAL HIGH (ref 11.5–15.5)
WBC: 6.6 10*3/uL (ref 4.0–10.5)
nRBC: 0.6 % — ABNORMAL HIGH (ref 0.0–0.2)

## 2021-12-03 LAB — BASIC METABOLIC PANEL
Anion gap: 8 (ref 5–15)
BUN: 34 mg/dL — ABNORMAL HIGH (ref 8–23)
CO2: 32 mmol/L (ref 22–32)
Calcium: 8.7 mg/dL — ABNORMAL LOW (ref 8.9–10.3)
Chloride: 102 mmol/L (ref 98–111)
Creatinine, Ser: 1.14 mg/dL (ref 0.61–1.24)
GFR, Estimated: >60 mL/min (ref >60)
Glucose, Bld: 80 mg/dL (ref 70–99)
Potassium: 4.3 mmol/L (ref 3.5–5.1)
Sodium: 142 mmol/L (ref 135–145)

## 2021-12-03 LAB — MAGNESIUM: Magnesium: 2.4 mg/dL (ref 1.7–2.4)

## 2021-12-03 LAB — PHOSPHORUS: Phosphorus: 3.3 mg/dL (ref 2.5–4.6)

## 2021-12-03 MED ORDER — GABAPENTIN 250 MG/5ML PO SOLN
400.0000 mg | Freq: Three times a day (TID) | ORAL | Status: DC
  Administered 2021-12-03 – 2021-12-05 (×6): 400 mg via ORAL
  Filled 2021-12-03 (×7): qty 8

## 2021-12-03 MED ORDER — GABAPENTIN 250 MG/5ML PO SOLN: 350.0000 mg | Freq: Three times a day (TID) | ORAL | Status: DC
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
## 2021-12-03 NOTE — Progress Notes (Signed)
Placed patient on vent. After appx 2 min requested to come off. Patient states he feels like he cant breath when on vent. Lungs ess clear after suctioning. Will continue to monitor. Back on TC. NP notified.

## 2021-12-04 DIAGNOSIS — J9622 Acute and chronic respiratory failure with hypercapnia: Secondary | ICD-10-CM | POA: Diagnosis not present

## 2021-12-04 DIAGNOSIS — J9621 Acute and chronic respiratory failure with hypoxia: Secondary | ICD-10-CM | POA: Diagnosis not present

## 2021-12-04 LAB — CBC WITH DIFFERENTIAL/PLATELET
Abs Immature Granulocytes: 0.01 10*3/uL (ref 0.00–0.07)
Basophils Absolute: 0 10*3/uL (ref 0.0–0.1)
Basophils Relative: 0 %
Eosinophils Absolute: 0.2 10*3/uL (ref 0.0–0.5)
Eosinophils Relative: 3 %
HCT: 30.9 % — ABNORMAL LOW (ref 39.0–52.0)
Hemoglobin: 8 g/dL — ABNORMAL LOW (ref 13.0–17.0)
Immature Granulocytes: 0 %
Lymphocytes Relative: 21 %
Lymphs Abs: 1.2 10*3/uL (ref 0.7–4.0)
MCH: 22.5 pg — ABNORMAL LOW (ref 26.0–34.0)
MCHC: 25.9 g/dL — ABNORMAL LOW (ref 30.0–36.0)
MCV: 87 fL (ref 80.0–100.0)
Monocytes Absolute: 0.6 10*3/uL (ref 0.1–1.0)
Monocytes Relative: 10 %
Neutro Abs: 3.8 10*3/uL (ref 1.7–7.7)
Neutrophils Relative %: 66 %
Platelets: 162 10*3/uL (ref 150–400)
RBC: 3.55 MIL/uL — ABNORMAL LOW (ref 4.22–5.81)
RDW: 19.9 % — ABNORMAL HIGH (ref 11.5–15.5)
WBC: 5.9 10*3/uL (ref 4.0–10.5)
nRBC: 0 % (ref 0.0–0.2)

## 2021-12-04 LAB — BASIC METABOLIC PANEL
Anion gap: 6 (ref 5–15)
BUN: 32 mg/dL — ABNORMAL HIGH (ref 8–23)
CO2: 37 mmol/L — ABNORMAL HIGH (ref 22–32)
Calcium: 8.5 mg/dL — ABNORMAL LOW (ref 8.9–10.3)
Chloride: 99 mmol/L (ref 98–111)
Creatinine, Ser: 1.16 mg/dL (ref 0.61–1.24)
GFR, Estimated: >60 mL/min (ref >60)
Glucose, Bld: 101 mg/dL — ABNORMAL HIGH (ref 70–99)
Potassium: 4.3 mmol/L (ref 3.5–5.1)
Sodium: 142 mmol/L (ref 135–145)

## 2021-12-04 LAB — MAGNESIUM: Magnesium: 2.1 mg/dL (ref 1.7–2.4)

## 2021-12-04 LAB — PHOSPHORUS: Phosphorus: 3.5 mg/dL (ref 2.5–4.6)

## 2021-12-04 MED ORDER — ENOXAPARIN SODIUM 100 MG/ML IJ SOSY: 0.5000 mg/kg | PREFILLED_SYRINGE | INTRAMUSCULAR | Status: DC

## 2021-12-04 MED ORDER — APIXABAN 2.5 MG PO TABS
2.5000 mg | ORAL_TABLET | Freq: Two times a day (BID) | ORAL | Status: DC
Start: 2021-12-04 — End: 2021-12-13
  Administered 2021-12-04 – 2021-12-13 (×19): 2.5 mg via ORAL
  Filled 2021-12-04 (×19): qty 1
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
## 2021-12-04 NOTE — TOC Progression Note (Signed)
Transition of Care [facility]) - Progression Note    Patient Details  Name: Gary [name] MRN: [id number] Date of Birth: Apr 03, 1956  [facility] CM/SW Contact  Shelbie Hutching, RN Phone Number: 12/04/2021, 4:12 PM  Clinical Narrative:    RNCM was able to speak with patient's significant other, [name] via phone today.  Patient really wants to get home, plan is to get him to agree to work with therapy consistently, NP in [facility] today spoke with patient and he agreed that if it would help him get home he would wear the vent at night and let us get him set up with the home vent for night. RNCM reached out to [facility] with [employer], he will let me know tomorrow what we need to do for the vent set up now that patient has trach.  Suction and trach supplies have already been ordered and are on standby with [employer].  [name] says that they have a hospital bed but that patient will need a new walker and may need a BSC.   [name] and [name] are coming to the hospital on Friday, RNCM will meet with them that morning, they should be here around 10 am.  Rob with [facility] notified TOC this morning that patient was approved for 84 hours unsure of when they may be available to start care.      Expected Discharge Plan: Long Term Acute Care (LTAC) Barriers to Discharge: Continued Medical Work up  Expected Discharge Plan and Services Expected Discharge Plan: Long Term Acute Care (LTAC)   Discharge Planning Services: CM Consult Post Acute Care Choice: Long Term Acute Care (LTAC) Living arrangements for the past 2 months: Single Family Home                 DME Arranged: N/A DME Agency: NA       HH Arranged: NA HH Agency: NA         Social Determinants of Health (SDOH) Interventions    Readmission Risk Interventions    05/04/2021    1:26 PM 04/07/2021    1:39 PM 03/12/2021   11:54 AM  Readmission Risk Prevention Plan  Transportation Screening Complete Complete Complete  PCP or  Specialist Appt within 3-5 Days  Complete Complete  HRI or [place]   Complete  Social Work Consult for [place] Planning/Counseling   Complete  Palliative Care Screening  Not Applicable Not Applicable  Medication Review Press photographer) Complete Complete Complete  PCP or Specialist appointment within 3-5 days of discharge Complete    HRI or [place] Complete    SW Recovery Care/Counseling Consult Complete    [place] Not Applicable

## 2021-12-04 NOTE — Progress Notes (Signed)
PT Cancellation Note  Patient Details Name: NEITHAN DAY MRN: [id number] DOB: 1956/02/12   Cancelled Treatment:    Reason Eval/Treat Not Completed: Patient declined, no reason specified. Pt awake on entry, refuses PT, wants to wait until Monday. Pt oriented to today which is Monday, to which he refutes claiming today as Sunday. Whiteboard in room says "Saturday" the "20th" neither of which are correct nor congruent. Pt declines PT today again, says he's too weak. Author explained that PT is here to address the 'weakness' problem. Pt then reports multiple times he'll be ready to start PT "tomorrow."   1:43 PM, 12/04/21 Etta Grandchild, PT, DPT Physical Therapist - [facility]  [phone] ([place])    [place] C 12/04/2021, 1:41 PM

## 2021-12-04 NOTE — Progress Notes (Signed)
Occupational Therapy Treatment Patient Details Name: Gary [name] MRN: [id number] DOB: Sep 26, 1955 Today's Date: 12/04/2021   History of present illness Pt is a 66 y/o male admitted mid June secondary to SOB, COPD exacerbation, acute metabolic encephalopathy, acute on chronic hypoxic and hypercapnic respiratory failure in setting of acute decompensated HFpEF and AECOPD requiring intubation and mechanical ventilation. The pt is now s/p tracheostomy and PEG tube placement. New medical complications including GI bleed.  8/16 he retuned to CCU and was vented due to hypercapnia.   OT comments  Gary [name] declined attempting EOB sitting today, but made a good effort at rolling in bed, therex in supine, and repositioning in supine. He required frequent suctioning for trach secretions and was able to perform those with Mod I. He was able to recall bed-level exercises that rehab staff had shown him and was able to perform LE therex with verbal cueing and Mod I, and UE therex with cueing and CGA. Pt was pleasant and cooperative this day. Will continue to follow POC, with hopes of conducting next session as a co-tx with PT and assisting pt to get OOB.    Recommendations for follow up therapy are one component of a multi-disciplinary discharge planning process, led by the attending physician.  Recommendations may be updated based on patient status, additional functional criteria and insurance authorization.    Follow Up Recommendations  Home health OT    Assistance Recommended at Discharge Frequent or constant Supervision/Assistance  Patient can return home with the following  Two people to help with walking and/or transfers;Assistance with cooking/housework;Direct supervision/assist for medications management;Assist for transportation;Two people to help with bathing/dressing/bathroom;Help with stairs or ramp for entrance   Equipment Recommendations  Other (comment) (hoyer lift, hospital bed)     Recommendations for Other Services      Precautions / Restrictions Precautions Precautions: Fall Precaution Comments: trach Restrictions Weight Bearing Restrictions: No       Mobility Bed Mobility Overal bed mobility: Needs Assistance Bed Mobility: Rolling Rolling: Mod assist              Transfers                         Balance Overall balance assessment: Needs assistance                                         ADL either performed or assessed with clinical judgement   ADL                                              Extremity/Trunk Assessment Upper Extremity Assessment Upper Extremity Assessment: Generalized weakness;LUE deficits/detail LUE Deficits / Details: LUE ~ 90 degree flexion at baseline   Lower Extremity Assessment Lower Extremity Assessment: Generalized weakness RLE Sensation: history of peripheral neuropathy LLE Sensation: history of peripheral neuropathy        Vision       Perception     Praxis      Cognition Arousal/Alertness: Awake/alert Behavior During Therapy: WFL for tasks assessed/performed Overall Cognitive Status: Within Functional Limits for tasks assessed  Exercises Total Joint Exercises Ankle Circles/Pumps: AROM, Strengthening, 10 reps, Supine, Both Quad Sets: Strengthening, 10 reps, Both, Supine Short Arc Quad: AROM, Both, 15 reps, Supine    Shoulder Instructions       General Comments      Pertinent Vitals/ Pain       Pain Assessment Pain Assessment: No/denies pain  Home Living                                          Prior Functioning/Environment              Frequency  Min 2X/week        Progress Toward Goals  OT Goals(current goals can now be found in the care plan section)  Progress towards OT goals: Progressing toward goals  Acute Rehab OT Goals OT Goal  Formulation: With patient Time For Goal Achievement: 12/15/21 Potential to Achieve Goals: Good  Plan Discharge plan needs to be updated;Frequency remains appropriate    Co-evaluation                 AM-PAC OT "6 Clicks" Daily Activity     Outcome Measure   Help from another person eating meals?: A Little Help from another person taking care of personal grooming?: A Lot Help from another person toileting, which includes using toliet, bedpan, or urinal?: A Lot Help from another person bathing (including washing, rinsing, drying)?: A Lot Help from another person to put on and taking off regular upper body clothing?: A Little Help from another person to put on and taking off regular lower body clothing?: A Lot 6 Click Score: 14    End of Session Equipment Utilized During Treatment: Oxygen  OT Visit Diagnosis: Other abnormalities of gait and mobility (R26.89);Muscle weakness (generalized) (M62.81);Pain   Activity Tolerance Patient tolerated treatment well   Patient Left in bed;with call bell/phone within reach   Nurse Communication          Time: 9983-3825 OT Time Calculation (min): 14 min  Charges: OT General Charges $OT Visit: 1 Visit OT Treatments $Self Care/Home Management : 8-22 mins  [name], PhD, MS, OTR/L 12/04/21, 5:44 PM

## 2021-12-05 DIAGNOSIS — J9621 Acute and chronic respiratory failure with hypoxia: Secondary | ICD-10-CM | POA: Diagnosis not present

## 2021-12-05 DIAGNOSIS — J9622 Acute and chronic respiratory failure with hypercapnia: Secondary | ICD-10-CM | POA: Diagnosis not present

## 2021-12-05 LAB — CBC WITH DIFFERENTIAL/PLATELET
Abs Immature Granulocytes: 0.01 10*3/uL (ref 0.00–0.07)
Basophils Absolute: 0 10*3/uL (ref 0.0–0.1)
Basophils Relative: 0 %
Eosinophils Absolute: 0.2 10*3/uL (ref 0.0–0.5)
Eosinophils Relative: 4 %
HCT: 31.6 % — ABNORMAL LOW (ref 39.0–52.0)
Hemoglobin: 8.2 g/dL — ABNORMAL LOW (ref 13.0–17.0)
Immature Granulocytes: 0 %
Lymphocytes Relative: 22 %
Lymphs Abs: 1.2 10*3/uL (ref 0.7–4.0)
MCH: 22.5 pg — ABNORMAL LOW (ref 26.0–34.0)
MCHC: 25.9 g/dL — ABNORMAL LOW (ref 30.0–36.0)
MCV: 86.8 fL (ref 80.0–100.0)
Monocytes Absolute: 0.4 10*3/uL (ref 0.1–1.0)
Monocytes Relative: 8 %
Neutro Abs: 3.8 10*3/uL (ref 1.7–7.7)
Neutrophils Relative %: 66 %
Platelets: 167 10*3/uL (ref 150–400)
RBC: 3.64 MIL/uL — ABNORMAL LOW (ref 4.22–5.81)
RDW: 19.4 % — ABNORMAL HIGH (ref 11.5–15.5)
WBC: 5.6 10*3/uL (ref 4.0–10.5)
nRBC: 0 % (ref 0.0–0.2)

## 2021-12-05 LAB — BASIC METABOLIC PANEL
Anion gap: 7 (ref 5–15)
BUN: 29 mg/dL — ABNORMAL HIGH (ref 8–23)
CO2: 35 mmol/L — ABNORMAL HIGH (ref 22–32)
Calcium: 8.4 mg/dL — ABNORMAL LOW (ref 8.9–10.3)
Chloride: 96 mmol/L — ABNORMAL LOW (ref 98–111)
Creatinine, Ser: 1.15 mg/dL (ref 0.61–1.24)
GFR, Estimated: >60 mL/min (ref >60)
Glucose, Bld: 132 mg/dL — ABNORMAL HIGH (ref 70–99)
Potassium: 4.1 mmol/L (ref 3.5–5.1)
Sodium: 138 mmol/L (ref 135–145)

## 2021-12-05 LAB — PHOSPHORUS: Phosphorus: 3.5 mg/dL (ref 2.5–4.6)

## 2021-12-05 LAB — MAGNESIUM: Magnesium: 2 mg/dL (ref 1.7–2.4)

## 2021-12-05 MED ORDER — CARBAMIDE PEROXIDE 6.5 % OT SOLN
5.0000 [drp] | Freq: Two times a day (BID) | OTIC | Status: DC
  Administered 2021-12-05 – 2021-12-13 (×10): 5 [drp] via OTIC
  Filled 2021-12-05: qty 15

## 2021-12-05 MED ORDER — GABAPENTIN 400 MG PO CAPS
400.0000 mg | ORAL_CAPSULE | Freq: Three times a day (TID) | ORAL | Status: DC
  Administered 2021-12-05 – 2021-12-13 (×23): 400 mg via ORAL
  Filled 2021-12-05 (×23): qty 1

## 2021-12-05 NOTE — Progress Notes (Signed)
PT Cancellation Note  Patient Details Name: Gary [name] MRN: [id number] DOB: February 08, 1956   Cancelled Treatment:    Reason Eval/Treat Not Completed: Patient declined, no reason specified (Chart reviewed, treatment attempted. Pt reports 10/10 pain in foot, cites this as reason he is unable to partake in physical therapy treatment today. Author offered to focus on unaffected leg, but this is not a viable option per patient.) Pt reports severe pain that is ongoing x several months, but today insidiously worse, feels like a "hot poker." Pt reports RN is aware of pain and has given medication that is available. He reports he need something stronger. Pt left in bed, all needs met. Will attempt physical therapy treatment again at later date/time.   3:10 PM, 12/05/21 Etta Grandchild, PT, DPT Physical Therapist - [facility]  [phone] ([place])    [place] C 12/05/2021, 3:08 PM

## 2021-12-05 NOTE — Progress Notes (Signed)
Patient yelling at staff all this shift as they walk past his room.  Patient stated he needed a sandwich tray, went to another department to get patient a tray. And gave patient more juice at request Ensured patient had his call bell and asked patient to use call bell instead of yelling at staff as not to disrupt other patients who are trying to get well.  offered to close his curtains for privacy as patient is lying in bed with legs spread and no cover his lower body at his request. Patient began to yell and stated "open my fuckin curtains and get the fuck out of my room"  Asked patient to use call bell if he needed anything else.

## 2021-12-05 NOTE — Progress Notes (Signed)
Nutrition Follow Up Note   DOCUMENTATION CODES:   Morbid obesity  INTERVENTION:   Double protein with meals   MVI po daily   Vitamin C 500mg po BID  Vitamin D 2000 units po daily   NUTRITION DIAGNOSIS:   Inadequate oral intake related to inability to eat (pt sedated and ventilated) as evidenced by NPO status. -resolving  GOAL:   Patient will meet greater than or equal to 90% of their needs -progressing   MONITOR:   PO intake, Supplement acceptance, Labs, Weight trends, Skin, I & O's  ASSESSMENT:   65 y/o male with h/o PUD, OSA, HTN, CKD III, substance abuse, CHF, Hep C and COPD and COVID 19 (02/2021) who is admitted with COPD exacerbation, CHF and AKI. 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  -Pt s/p tracheostomy 6/20 -Pt s/p 20F IR G-tube 6/30 (now removed)  Met with pt in room today. Pt refusing ventilator overnight. Pt continues to have good appetite and oral intake; pt eating 100% of meals. G-tube removed 8/14 as pt refused all nutrition support via tube. Pt on a regular diet; pt is getting double protein with meals. Per chart, pt remains up ~28lbs from his UBW.   Medications reviewed and include: vitamin C, vitamin D, melatonin, MVI, protonix, ferrous fumerate  Labs reviewed: K 4.1 wnl, BUN 29(H), P 3.5 wnl, Mg 2.0 wnl Hgb 8.2(L), Hct 31.6(L), MCH 22.5(L), MCHC 25.9(L) Iron 19(L), TIBC 333, ferritin 17(L)- 8/9   Diet Order:    Diet Order             Diet regular Room service appropriate? Yes; Fluid consistency: Thin  Diet effective now                  EDUCATION NEEDS:   No education needs have been identified at this time  Skin:  Skin Assessment: Skin Integrity Issues: Skin Integrity Issues:: Incisions, Stage II, Other (Comment) Stage II: medial perineum Incisions: closed neck s/p trach Other: MASD to rt groin  Last BM:  8/22- type 5  Height:   Ht Readings from Last 1 Encounters:  11/29/21 6' 3" (1.905 m)     Weight:   Wt Readings from Last 1 Encounters:  12/05/21 (!) 193.7 kg    Ideal Body Weight:  89 kg  BMI:  Body mass index is 53.37 kg/m.  Estimated Nutritional Needs:   Kcal:  2800-3100kcal/day  Protein:  >145g/day  Fluid:  2.0L/day    MS, RD, LDN Please refer to AMION for RD and/or RD on-call/weekend/after hours pager 

## 2021-12-05 NOTE — Progress Notes (Signed)
Late entry: approximately 2205, patient states he does not want to go on ventilator. States he may give it another try on tomorrow night.
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
## 2021-12-06 DIAGNOSIS — J9621 Acute and chronic respiratory failure with hypoxia: Secondary | ICD-10-CM | POA: Diagnosis not present

## 2021-12-06 DIAGNOSIS — J9622 Acute and chronic respiratory failure with hypercapnia: Secondary | ICD-10-CM | POA: Diagnosis not present

## 2021-12-06 DIAGNOSIS — J441 Chronic obstructive pulmonary disease with (acute) exacerbation: Secondary | ICD-10-CM | POA: Diagnosis not present

## 2021-12-06 LAB — CBC
HCT: 31.8 % — ABNORMAL LOW (ref 39.0–52.0)
Hemoglobin: 8.1 g/dL — ABNORMAL LOW (ref 13.0–17.0)
MCH: 22.1 pg — ABNORMAL LOW (ref 26.0–34.0)
MCHC: 25.5 g/dL — ABNORMAL LOW (ref 30.0–36.0)
MCV: 86.6 fL (ref 80.0–100.0)
Platelets: 177 10*3/uL (ref 150–400)
RBC: 3.67 MIL/uL — ABNORMAL LOW (ref 4.22–5.81)
RDW: 18.9 % — ABNORMAL HIGH (ref 11.5–15.5)
WBC: 5.5 10*3/uL (ref 4.0–10.5)
nRBC: 0 % (ref 0.0–0.2)

## 2021-12-06 LAB — BASIC METABOLIC PANEL
Anion gap: 7 (ref 5–15)
BUN: 26 mg/dL — ABNORMAL HIGH (ref 8–23)
CO2: 37 mmol/L — ABNORMAL HIGH (ref 22–32)
Calcium: 8.5 mg/dL — ABNORMAL LOW (ref 8.9–10.3)
Chloride: 96 mmol/L — ABNORMAL LOW (ref 98–111)
Creatinine, Ser: 1.08 mg/dL (ref 0.61–1.24)
GFR, Estimated: >60 mL/min (ref >60)
Glucose, Bld: 120 mg/dL — ABNORMAL HIGH (ref 70–99)
Potassium: 4.2 mmol/L (ref 3.5–5.1)
Sodium: 140 mmol/L (ref 135–145)

## 2021-12-06 NOTE — Progress Notes (Signed)
Patient undid velcro from trach collar, he stated "this is too tight and makes me cough".  Education was provided regarding safety around trach and trach collar.   RT called.   Safety ensured.

## 2021-12-06 NOTE — Progress Notes (Signed)
Mobility Specialist - Progress Note    12/06/21 1249  Mobility  Activity Transferred from chair to bed  Level of Assistance +2 (takes two people)  Assistive Device  (hoyer lift)  Activity Response Tolerated well  $Mobility charge 1 Mobility   Pt sitting in chair upon entry, utilizing RA. Pt transferred from chair to bed using hoyer lift +2, tolerated well. Pt left supine with needs in reach. RN present upon exit. No complaints.   Candie Mile Mobility Specialist 12/06/21 12:54 PM

## 2021-12-06 NOTE — Progress Notes (Signed)
PROGRESS NOTE    Gary [name]  KYH:[id number] DOB: 02-Jun-1955 DOA: 09/19/2021 PCP: [facility]    Assessment & Plan:   Principal Problem:   Acute on chronic respiratory failure with hypoxia and hypercapnia ([place]) Active Problems:   Major depressive disorder, recurrent episode, moderate ([name])   Bloody stool   COPD with acute exacerbation ([name])   Acute metabolic encephalopathy   HTN (hypertension)   Acute on chronic diastolic congestive heart failure ([name])   Obesity, Class III, BMI 40-49.9 (morbid obesity) ([place])   Obstructive sleep apnea   Chronic kidney disease, stage 3a ([name])   HLD (hyperlipidemia)   Pressure injury of skin   Hematochezia  Assessment and Plan:  Recurrent acute on chronic hypoxic hypercarbic respiratory failure: s/p tracheostomy by ENT on 6/20. Continue on supplemental oxygen and wean as tolerated   Acute COPD exacerbation & pseudomonas pneumonia: completed abx course. Continue on bronchodilators    Acute GI bleed: s/p undergone attempted embolization by vascular surgery, flex sig by gastroenterology. H&H are stable    Acute metabolic encephalopathy: secondary to hypercarbia. Resolved    Hyperglycemia: no hx of DM, HbA1c 4.9.    Chronic BLE neuropathic pain: continue on gabapentin  Morbid obesity: BMI 53.3. Complicates overall care & prognosis    DVT prophylaxis: eliquis Code Status:  full  Family Communication: Disposition Plan:  unsafe d/c plan   Level of care: Med-Surg  Status is: Inpatient Remains inpatient appropriate because: unsafe d/c plan    Consultants:    Procedures:   Antimicrobials:    Subjective: Pt c/o b/l foot pain   Objective: Vitals:   12/06/21 0500 12/06/21 0525 12/06/21 0532 12/06/21 0722  BP: 106/71  (!) 104/53   Pulse: 78  77   Resp:   20   Temp:   98.3 F (36.8 C)   TempSrc:   Oral   SpO2:  95%  93%  Weight: (!) 193.6 kg     Height:        Intake/Output Summary (Last 24 hours) at  12/06/2021 0745 Last data filed at 12/06/2021 0525 Gross per 24 hour  Intake --  Output 1900 ml  Net -1900 ml   Filed Weights   12/04/21 0351 12/05/21 0500 12/06/21 0500  Weight: (!) 192.6 kg (!) 193.7 kg (!) 193.6 kg    Examination:  General exam: Appears calm and comfortable  Respiratory system: diminished breath sounds  Cardiovascular system: S1 & S2+. No rubs, gallops or clicks.  Gastrointestinal system: Abdomen is obese, soft and nontender.  Normal bowel sounds heard. Central nervous system: Alert and oriented. Moves all extremities  Psychiatry: Judgement and insight appear normal. Mood & affect appropriate.     Data Reviewed: I have personally reviewed following labs and imaging studies  CBC: Recent Labs  Lab 12/02/21 0457 12/03/21 0541 12/04/21 0517 12/05/21 0454 12/06/21 0445  WBC 6.8 6.6 5.9 5.6 5.5  NEUTROABS  --  4.8 3.8 3.8  --   HGB 8.0* 8.5* 8.0* 8.2* 8.1*  HCT 29.4* 32.0* 30.9* 31.6* 31.8*  MCV 84.0 84.7 87.0 86.8 86.6  PLT 186 186 162 167 151   Basic Metabolic Panel: Recent Labs  Lab 12/01/21 0429 12/02/21 0457 12/03/21 0541 12/04/21 0517 12/05/21 0454 12/06/21 0445  NA 140 140 142 142 138 140  K 4.0 4.0 4.3 4.3 4.1 4.2  CL 97* 100 102 99 96* 96*  CO2 34* 33* 32 37* 35* 37*  GLUCOSE 78 97 80 101* 132* 120*  BUN 28* 31* 34* 32* 29* 26*  CREATININE 1.25* 1.15 1.14 1.16 1.15 1.08  CALCIUM 8.5* 8.3* 8.7* 8.5* 8.4* 8.5*  MG 2.1 2.2 2.4 2.1 2.0  --   PHOS 4.2 3.9 3.3 3.5 3.5  --    GFR: Estimated Creatinine Clearance: 125.2 mL/min (by C-G formula based on SCr of 1.08 mg/dL). Liver Function Tests: No results for input(s): "AST", "ALT", "ALKPHOS", "BILITOT", "PROT", "ALBUMIN" in the last 168 hours. No results for input(s): "LIPASE", "AMYLASE" in the last 168 hours. Recent Labs  Lab 11/30/21 0754  AMMONIA 19   Coagulation Profile: No results for input(s): "INR", "PROTIME" in the last 168 hours. Cardiac Enzymes: No results for input(s):  "CKTOTAL", "CKMB", "CKMBINDEX", "TROPONINI" in the last 168 hours. BNP (last 3 results) No results for input(s): "PROBNP" in the last 8760 hours. HbA1C: No results for input(s): "HGBA1C" in the last 72 hours. CBG: Recent Labs  Lab 11/29/21 2127 11/29/21 2224  GLUCAP 110* 121*   Lipid Profile: No results for input(s): "CHOL", "HDL", "LDLCALC", "TRIG", "CHOLHDL", "LDLDIRECT" in the last 72 hours. Thyroid Function Tests: No results for input(s): "TSH", "T4TOTAL", "FREET4", "T3FREE", "THYROIDAB" in the last 72 hours. Anemia Panel: No results for input(s): "VITAMINB12", "FOLATE", "FERRITIN", "TIBC", "IRON", "RETICCTPCT" in the last 72 hours. Sepsis Labs: No results for input(s): "PROCALCITON", "LATICACIDVEN" in the last 168 hours.  Recent Results (from the past 240 hour(s))  MRSA Next Gen by PCR, Nasal     Status: None   Collection Time: 11/29/21 11:37 PM   Specimen: Nasal Mucosa; Nasal Swab  Result Value Ref Range Status   MRSA by PCR Next Gen NOT DETECTED NOT DETECTED Final    Comment: (NOTE) The GeneXpert MRSA Assay (FDA approved for NASAL specimens only), is one component of a comprehensive MRSA colonization surveillance program. It is not intended to diagnose MRSA infection nor to guide or monitor treatment for MRSA infections. Test performance is not FDA approved in patients less than 64 years old. Performed at [facility], [address], [place], [place] [address]          Radiology Studies: No results found.      Scheduled Meds:  apixaban  2.5 mg Oral BID   arformoterol  15 mcg Nebulization BID   vitamin C  500 mg Oral BID   atorvastatin  10 mg Oral Daily   budesonide (PULMICORT) nebulizer solution  0.5 mg Nebulization BID   carbamide peroxide  5 drop Left EAR BID   Chlorhexidine Gluconate Cloth  6 each Topical Q0600   cholecalciferol  2,000 Units Oral Daily   escitalopram  5 mg Oral Daily   ferrous [id number] C-folic acid  1 capsule  Oral BID PC   gabapentin  400 mg Oral Q8H   lidocaine  1 patch Transdermal Daily   melatonin  5 mg Oral QHS   multivitamin with minerals  1 tablet Oral Daily   mouth rinse  15 mL Mouth Rinse 4 times per day   pantoprazole  40 mg Oral BID AC   revefenacin  175 mcg Nebulization Daily   sodium chloride flush  3 mL Intravenous Q12H   torsemide  20 mg Oral Daily   Continuous Infusions:  sodium chloride Stopped (10/07/21 0542)   sodium chloride       LOS: 78 days    Time spent: 25 mins     Wyvonnia Dusky, MD [facility] Pager [phone]  If 7PM-7AM, please contact night-coverage [email or web address] 12/06/2021, 7:45  AM

## 2021-12-06 NOTE — Progress Notes (Signed)
Physical Therapy Treatment Patient Details Name: Gary [name] MRN: [id number] DOB: Jun 21, 1955 Today's Date: 12/06/2021   History of Present Illness Pt is a 66 y/o male admitted mid June secondary to SOB, COPD exacerbation, acute metabolic encephalopathy, acute on chronic hypoxic and hypercapnic respiratory failure in setting of acute decompensated HFpEF and AECOPD requiring intubation and mechanical ventilation. The pt is now s/p tracheostomy and PEG tube placement. New medical complications including GI bleed.  8/16 he retuned to CCU and was vented due to hypercapnia.    PT Comments    Pt was long sitting in bed upon arriving. He is agreeable to session and motivated to attempt standing. +2 throughout session for safety. Pt was able to demonstrate slightly improved bed mobility however uses bed function + mod assist of one to achieve EOB short sit. Sat EOB x ~ 15 minutes throughout session. He did stand 1 x ~ 10 sec and attempted a second time however was unable. Pt has been self limiting throughout hospitalization. Gary [name] is under the impression that when he gets home, he will be able transfer (stand pivot) without assistance. Chief Strategy Officer educated pt that he has not been able to stand pivot for over three months. Pt did agree to transfer to recliner(bariatric)  via slide board. +2 assistance for safety. Hoyer pad placed under pt for returning to bed. Acute PT will continue to follow and progress as able per current POC.     Recommendations for follow up therapy are one component of a multi-disciplinary discharge planning process, led by the attending physician.  Recommendations may be updated based on patient status, additional functional criteria and insurance authorization.  Follow Up Recommendations  Home health PT (pt is difficult to place and is planning to return home at [place]. HIGHLY recommend 24/7 care and as much follow up PT as able)     Assistance Recommended at Discharge Frequent or  constant Supervision/Assistance  Patient can return home with the following Two people to help with walking and/or transfers;Two people to help with bathing/dressing/bathroom;Assistance with cooking/housework;Assist for transportation;Help with stairs or ramp for entrance   Equipment Recommendations  None recommended by PT       Precautions / Restrictions Precautions Precautions: Fall Precaution Comments: trach Restrictions Weight Bearing Restrictions: No     Mobility  Bed Mobility Overal bed mobility: Needs Assistance Bed Mobility: Rolling Rolling: Mod assist   Supine to sit: Mod assist (of one)  General bed mobility comments: Overall pt did demonstrate slightly improved abilities to exit L side of bed.    Transfers Overall transfer level: Needs assistance Equipment used: Rolling walker (2 wheels), Sliding board (Bariatric) Transfers: Bed to chair/wheelchair/BSC Sit to Stand: +2 safety/equipment, +2 physical assistance, Min assist     Lateral/Scoot Transfers: With slide board, From elevated surface, +2 safety/equipment, Mod assist, Max assist General transfer comment: pt did stand from elevated bed height to bariatric RW 1 x from very elevated bed height. He stood ~ 5-10 secs. On 2nd attempt, pt is self limiting and unable to achieve standing even with +2 assistance. pt is fearful and gets anxious with mobility.did progress to lateral slide board transfer to recliner towards L with +2 for safety + mod/max assist of one    Ambulation/Gait  General Gait Details: unable. pt has been self limting throughout admission.   Balance Overall balance assessment: Needs assistance Sitting-balance support: Feet unsupported, Bilateral upper extremity supported, Single extremity supported Sitting balance-[name] Scale: Good     Standing balance support:  Bilateral upper extremity supported, During functional activity, Reliant on assistive device for balance Standing balance-[name] Scale:  Fair Standing balance comment: Very limited standing tolerance. difficult to assess.     Cognition Arousal/Alertness: Awake/alert Behavior During Therapy: WFL for tasks assessed/performed Overall Cognitive Status: Within Functional Limits for tasks assessed    Orientation Level: Disoriented to, Time, Situation     Following Commands: Follows one step commands with increased time, Follows one step commands consistently Safety/Judgement: Decreased awareness of safety, Decreased awareness of deficits Awareness: Intellectual Problem Solving: Slow processing, Requires verbal cues, Requires tactile cues General Comments: pt is A and O x 2. still lacks full understanding odf deficits and overall awareness of situation. pt thinks that he will be able to easily go home an be able to stand pivot. Chief Strategy Officer educated pt that he has not performed stand pivot and months and that he has been self limiting. pt gets very scared/fearful in standing.               Pertinent Vitals/Pain Pain Assessment Pain Assessment: 0-10 Pain Score: 4  Faces Pain Scale: Hurts a little bit Pain Location: c/o pain in feet L>R Pain Descriptors / Indicators: Constant, Burning Pain Intervention(s): Limited activity within patient's tolerance, Monitored during session, Premedicated before session, Repositioned     PT Goals (current goals can now be found in the care plan section) Acute Rehab PT Goals Patient Stated Goal: go home Progress towards PT goals: Progressing toward goals    Frequency    Min 2X/week      PT Plan Current plan remains appropriate    Co-evaluation     PT goals addressed during session: Balance;Mobility/safety with mobility;Proper use of DME;Strengthening/ROM        AM-PAC PT "6 Clicks" Mobility   Outcome Measure  Help needed turning from your back to your side while in a flat bed without using bedrails?: A Lot Help needed moving from lying on your back to sitting on the side of  a flat bed without using bedrails?: A Lot Help needed moving to and from a bed to a chair (including a wheelchair)?: Total Help needed standing up from a chair using your arms (e.g., wheelchair or bedside chair)?: A Lot Help needed to walk in hospital room?: Total Help needed climbing 3-5 steps with a railing? : Total 6 Click Score: 9    End of Session Equipment Utilized During Treatment: Oxygen (trach collar) Activity Tolerance: Patient tolerated treatment well;Other (comment) (pt is self limiting) Patient left: in chair;with call bell/phone within reach;with chair alarm set Nurse Communication: Mobility status PT Visit Diagnosis: Unsteadiness on feet (R26.81);Muscle weakness (generalized) (M62.81);Difficulty in walking, not elsewhere classified (R26.2)     Time: 1610-9604 PT Time Calculation (min) (ACUTE ONLY): 34 min  Charges:  $Therapeutic Activity: 23-37 mins                    Julaine Fusi PTA 12/06/21, 3:46 PM

## 2021-12-07 DIAGNOSIS — J9621 Acute and chronic respiratory failure with hypoxia: Secondary | ICD-10-CM | POA: Diagnosis not present

## 2021-12-07 DIAGNOSIS — J441 Chronic obstructive pulmonary disease with (acute) exacerbation: Secondary | ICD-10-CM | POA: Diagnosis not present

## 2021-12-07 DIAGNOSIS — J9622 Acute and chronic respiratory failure with hypercapnia: Secondary | ICD-10-CM | POA: Diagnosis not present

## 2021-12-07 MED ORDER — FUROSEMIDE 10 MG/ML IJ SOLN
40.0000 mg | Freq: Every day | INTRAMUSCULAR | Status: AC
  Administered 2021-12-07: 40 mg via INTRAVENOUS
  Filled 2021-12-07: qty 4

## 2021-12-07 NOTE — Progress Notes (Signed)
Occupational Therapy Treatment Patient Details Name: SAIF PETER MRN: [id number] DOB: 01/10/1956 Today's Date: 12/07/2021   History of present illness Pt is a 66 y/o male admitted mid June secondary to SOB, COPD exacerbation, acute metabolic encephalopathy, acute on chronic hypoxic and hypercapnic respiratory failure in setting of acute decompensated HFpEF and AECOPD requiring intubation and mechanical ventilation. The pt is now s/p tracheostomy and PEG tube placement. New medical complications including GI bleed.  8/16 he retuned to CCU and was vented due to hypercapnia.   OT comments  For pt and therapist safety, session conducted jointly between OT and PT. Following encouragement, Mr. Pfenning did make good effort today and was motivated to sit EOB, clean up, and attempt standing. Pt demonstrates somewhat improved bed mobility, able to sit EOB for ~20 min w/ good balance while performing grooming, bathing, dressing tasks with CGA-Min A and with ongoing cueing/encouragement for continued participation. Makes 3 attempts at standing from elevated height, is able to clear buttocks from mattress but cannot achieve full standing balance. Requires Mod A for elevating LE during sit<supine. Is able to use UE on headboard to [facility] reposition self towards HOB in supine. Pt requires Max A for pericare following BM in bed, and able to roll L and R with Min A for changing linens. Palliative medicine RN provides educ to pt during session re: use of vent at nights. Confirmed with pt that family will come tomorrow for training re: home care needs. Will continue to follow pt and progress as able per current POC.     Recommendations for follow up therapy are one component of a multi-disciplinary discharge planning process, led by the attending physician.  Recommendations may be updated based on patient status, additional functional criteria and insurance authorization.    Follow Up Recommendations  Home health OT     Assistance Recommended at Discharge Frequent or constant Supervision/Assistance  Patient can return home with the following  Two people to help with walking and/or transfers;Assistance with cooking/housework;Direct supervision/assist for medications management;Assist for transportation;Two people to help with bathing/dressing/bathroom;Help with stairs or ramp for entrance   Equipment Recommendations       Recommendations for Other Services      Precautions / Restrictions Precautions Precautions: Fall Precaution Comments: trach Restrictions Weight Bearing Restrictions: No       Mobility Bed Mobility Overal bed mobility: Needs Assistance Bed Mobility: Rolling, Supine to Sit, Sit to Supine Rolling: Min assist   Supine to sit: Min assist Sit to supine: Mod assist, +2 for physical assistance        Transfers Overall transfer level: Needs assistance Equipment used: Rolling walker (2 wheels) Transfers: Sit to/from Stand Sit to Stand: +2 safety/equipment, +2 physical assistance, Min assist           General transfer comment: pt did stand from elevated bed height to bariatric RW 1 x from very elevated bed height. He stood ~ 5-10 secs. On 2nd attempt, pt is self limiting and unable to achieve standing even with +2 assistance. pt is fearful and gets anxious with mobility     Balance Overall balance assessment: Needs assistance Sitting-balance support: Feet unsupported, Bilateral upper extremity supported, Single extremity supported Sitting balance-[name] Scale: Good Sitting balance - Comments: Minimal assistance while seated EOB with feet support only   Standing balance support: Bilateral upper extremity supported, Reliant on assistive device for balance Standing balance-[name] Scale: Poor Standing balance comment: Very limited standing tolerance. difficult to assess.  ADL either performed or assessed with clinical judgement   ADL  Overall ADL's : Needs assistance/impaired     Grooming: Wash/dry hands;Wash/dry face;Oral care;Sitting;Set up;Min guard Grooming Details (indicate cue type and reason): Pt able to sit EOB and engage in washing, toothbrushing, shaving, with VCs and repeated encouragement for task continuation Upper Body Bathing: Supervision/ safety;Set up Upper Body Bathing Details (indicate cue type and reason): seated EOB     Upper Body Dressing : Minimal assistance;Sitting           Toileting- Clothing Manipulation and Hygiene: Maximal assistance Toileting - Clothing Manipulation Details (indicate cue type and reason): Max A for pericare following BM in bed. Pt able to assist by rolling side to side at bed level     Functional mobility during ADLs: +2 for safety/equipment;+2 for physical assistance;Moderate assistance      Extremity/Trunk Assessment Upper Extremity Assessment Upper Extremity Assessment: Generalized weakness   Lower Extremity Assessment Lower Extremity Assessment: Generalized weakness        Vision       Perception     Praxis      Cognition Arousal/Alertness: Awake/alert Behavior During Therapy: WFL for tasks assessed/performed Overall Cognitive Status: Within Functional Limits for tasks assessed                                          Exercises Other Exercises Other Exercises: Provided educ re: importance of standing, transferring, OOB mobility    Shoulder Instructions       General Comments      Pertinent Vitals/ Pain       Pain Assessment Pain Assessment: No/denies pain  Home Living                                          Prior Functioning/Environment              Frequency  Min 2X/week        Progress Toward Goals  OT Goals(current goals can now be found in the care plan section)  Progress towards OT goals: Progressing toward goals  Acute Rehab OT Goals OT Goal Formulation: With patient Time  For Goal Achievement: 12/15/21 Potential to Achieve Goals: Good  Plan Frequency remains appropriate;Discharge plan remains appropriate    Co-evaluation    PT/OT/SLP Co-Evaluation/Treatment: Yes Reason for Co-Treatment: Complexity of the patient's impairments (multi-system involvement);For patient/therapist safety;To address functional/ADL transfers PT goals addressed during session: Mobility/safety with mobility;Proper use of DME;Strengthening/ROM OT goals addressed during session: ADL's and self-care;Strengthening/ROM      AM-PAC OT "6 Clicks" Daily Activity     Outcome Measure   Help from another person eating meals?: None Help from another person taking care of personal grooming?: A Little Help from another person toileting, which includes using toliet, bedpan, or urinal?: A Lot Help from another person bathing (including washing, rinsing, drying)?: A Lot Help from another person to put on and taking off regular upper body clothing?: A Little Help from another person to put on and taking off regular lower body clothing?: A Lot 6 Click Score: 16    End of Session Equipment Utilized During Treatment: Rolling walker (2 wheels)  OT Visit Diagnosis: Other abnormalities of gait and mobility (R26.89);Muscle weakness (generalized) (M62.81);Pain   Activity Tolerance Patient tolerated treatment  well   Patient Left in bed;with call bell/phone within reach   Nurse Communication          Time: 6384-5364 OT Time Calculation (min): 48 min  Charges: OT General Charges $OT Visit: 1 Visit OT Treatments $Self Care/Home Management : 23-37 mins [name], PhD, MS, OTR/L 12/07/21, 5:55 PM

## 2021-12-07 NOTE — TOC Progression Note (Signed)
Transition of Care [facility]) - Progression Note    Patient Details  Name: Gary [name] MRN: [id number] Date of Birth: 03/21/1956  [facility] CM/SW Contact  Beverly Sessions, RN Phone Number: 12/07/2021, 3:00 PM  Clinical Narrative:     Leonor Liv from [place] for private duty nursing met with patient at bedside today.  Tentative they can do start of care 8/30, he is awaiting confirmation Spoke with sister [name].  She confirms her and tracy will be in tomorrow at 10 am for education.   Patient requesting trilogy at discharge.  Patient has been refusing vent here at the hospital.  Patient is now requesting vent at night while in the hospital.  MD updated, and zack with adapt updated   Expected Discharge Plan: Long Term Acute Care (LTAC) Barriers to Discharge: Continued Medical Work up  Expected Discharge Plan and Services Expected Discharge Plan: [place] (LTAC)   Discharge Planning Services: CM Consult Post Acute Care Choice: Long Term Acute Care (LTAC) Living arrangements for the past 2 months: Single Family Home                 DME Arranged: N/A DME Agency: NA       HH Arranged: NA HH Agency: NA         Social Determinants of Health (SDOH) Interventions    Readmission Risk Interventions    05/04/2021    1:26 PM 04/07/2021    1:39 PM 03/12/2021   11:54 AM  Readmission Risk Prevention Plan  Transportation Screening Complete Complete Complete  PCP or Specialist Appt within 3-5 Days  Complete Complete  HRI or Home Care Consult   Complete  Social Work Consult for [place] Planning/Counseling   Complete  Palliative Care Screening  Not Applicable Not Applicable  Medication Review Press photographer) Complete Complete Complete  PCP or Specialist appointment within 3-5 days of discharge Complete    HRI or [place] Complete    SW Recovery Care/Counseling Consult Complete    [place] Not Applicable

## 2021-12-07 NOTE — TOC Progression Note (Addendum)
Transition of Care [facility]) - Progression Note    Patient Details  Name: Gary [name] MRN: [id number] Date of Birth: February 17, 1956  Transition of Care [facility]) CM/SW Contact  Beverly Sessions, RN Phone Number: 12/07/2021, 3:52 PM  Clinical Narrative:    Per MD (williams) due to noncompliance patient will not discharge with Trilogy.  Patient will discharge with Trach collar as planned. Zack with adapt updated    Expected Discharge Plan: Long Term Acute Care (LTAC) Barriers to Discharge: Continued Medical Work up  Expected Discharge Plan and Services Expected Discharge Plan: Long Term Acute Care (LTAC)   Discharge Planning Services: CM Consult Post Acute Care Choice: Long Term Acute Care (LTAC) Living arrangements for the past 2 months: Single Family Home                 DME Arranged: N/A DME Agency: NA       HH Arranged: NA HH Agency: NA         Social Determinants of Health (SDOH) Interventions    Readmission Risk Interventions    05/04/2021    1:26 PM 04/07/2021    1:39 PM 03/12/2021   11:54 AM  Readmission Risk Prevention Plan  Transportation Screening Complete Complete Complete  PCP or Specialist Appt within 3-5 Days  Complete Complete  HRI or [place]   Complete  Social Work Consult for Ages Planning/Counseling   Complete  Palliative Care Screening  Not Applicable Not Applicable  Medication Review Press photographer) Complete Complete Complete  PCP or Specialist appointment within 3-5 days of discharge Complete    HRI or [place] Complete    SW Recovery Care/Counseling Consult Complete    [place] Not Applicable

## 2021-12-07 NOTE — Progress Notes (Signed)
PROGRESS NOTE    Gary [name]  IWP:[id number] DOB: 19-Oct-1955 DOA: 09/19/2021 PCP: Center, [facility]    Assessment & Plan:   Principal Problem:   Acute on chronic respiratory failure with hypoxia and hypercapnia ([place]) Active Problems:   Major depressive disorder, recurrent episode, moderate ([name])   Bloody stool   COPD with acute exacerbation ([name])   Acute metabolic encephalopathy   HTN (hypertension)   Acute on chronic diastolic congestive heart failure ([name])   Obesity, Class III, BMI 40-49.9 (morbid obesity) ([place])   Obstructive sleep apnea   Chronic kidney disease, stage 3a ([name])   HLD (hyperlipidemia)   Pressure injury of skin   Hematochezia  Assessment and Plan:  Recurrent acute on chronic hypoxic hypercarbic respiratory failure: s/p tracheostomy by ENT on 6/20. Continue on supplemental oxygen and wean as tolerated  Acute COPD exacerbation & pseudomonas pneumonia: completed abx course. Continue on bronchodilators    Acute GI bleed: s/p undergone attempted embolization by vascular surgery, flex sig by gastroenterology. H&H are stable.   Acute metabolic encephalopathy: secondary to hypercarbia. Resolved    Hyperglycemia: no hx of DM, HbA1c 4.9.    Chronic BLE neuropathic pain: continue on gabapentin   Morbid obesity: BMI 53.3. Complicates overall care & prognosis    DVT prophylaxis: eliquis Code Status:  full  Family Communication: Disposition Plan:  likely will be d/c home w/ [place] on 8/30 when [facility] can start. See CM's notes   Level of care: Med-Surg  Status is: Inpatient Remains inpatient appropriate because:  likely will be d/c home w/ [facility] on 8/30 when [facility] can start. See CM's notes     Consultants:    Procedures:   Antimicrobials:    Subjective: Pt c/o b/l feet swelling   Objective: Vitals:   12/06/21 2358 12/07/21 0435 12/07/21 0439 12/07/21 0500  BP:   110/67   Pulse:   75   Resp:   17   Temp:   97.8 F (36.6 C)   TempSrc:       SpO2: 97% 96% 93%   Weight:    (!) 181 kg  Height:        Intake/Output Summary (Last 24 hours) at 12/07/2021 0732 Last data filed at 12/07/2021 0448 Gross per 24 hour  Intake 1320 ml  Output 2600 ml  Net -1280 ml   Filed Weights   12/05/21 0500 12/06/21 0500 12/07/21 0500  Weight: (!) 193.7 kg (!) 193.6 kg (!) 181 kg    Examination:  General exam: Appears comfortable  Respiratory system: decreased breath sounds b/l  Cardiovascular system: S1/S2+. No rubs or gallops  Gastrointestinal system: Abd is soft, NT, obese, hypoactive bowel sounds  Central nervous system: Alert and oriented. Moves all extremities  Psychiatry: judgement and insight appears at baseline. Mood & affect appropriate.      Data Reviewed: I have personally reviewed following labs and imaging studies  CBC: Recent Labs  Lab 12/02/21 0457 12/03/21 0541 12/04/21 0517 12/05/21 0454 12/06/21 0445  WBC 6.8 6.6 5.9 5.6 5.5  NEUTROABS  --  4.8 3.8 3.8  --   HGB 8.0* 8.5* 8.0* 8.2* 8.1*  HCT 29.4* 32.0* 30.9* 31.6* 31.8*  MCV 84.0 84.7 87.0 86.8 86.6  PLT 186 186 162 167 505   Basic Metabolic Panel: Recent Labs  Lab 12/01/21 0429 12/02/21 0457 12/03/21 0541 12/04/21 0517 12/05/21 0454 12/06/21 0445  NA 140 140 142 142 138 140  K 4.0 4.0 4.3 4.3 4.1 4.2  CL  97* 100 102 99 96* 96*  CO2 34* 33* 32 37* 35* 37*  GLUCOSE 78 97 80 101* 132* 120*  BUN 28* 31* 34* 32* 29* 26*  CREATININE 1.25* 1.15 1.14 1.16 1.15 1.08  CALCIUM 8.5* 8.3* 8.7* 8.5* 8.4* 8.5*  MG 2.1 2.2 2.4 2.1 2.0  --   PHOS 4.2 3.9 3.3 3.5 3.5  --    GFR: Estimated Creatinine Clearance: 120.3 mL/min (by C-G formula based on SCr of 1.08 mg/dL). Liver Function Tests: No results for input(s): "AST", "ALT", "ALKPHOS", "BILITOT", "PROT", "ALBUMIN" in the last 168 hours. No results for input(s): "LIPASE", "AMYLASE" in the last 168 hours. Recent Labs  Lab 11/30/21 0754  AMMONIA 19   Coagulation Profile: No results for input(s):  "INR", "PROTIME" in the last 168 hours. Cardiac Enzymes: No results for input(s): "CKTOTAL", "CKMB", "CKMBINDEX", "TROPONINI" in the last 168 hours. BNP (last 3 results) No results for input(s): "PROBNP" in the last 8760 hours. HbA1C: No results for input(s): "HGBA1C" in the last 72 hours. CBG: No results for input(s): "GLUCAP" in the last 168 hours.  Lipid Profile: No results for input(s): "CHOL", "HDL", "LDLCALC", "TRIG", "CHOLHDL", "LDLDIRECT" in the last 72 hours. Thyroid Function Tests: No results for input(s): "TSH", "T4TOTAL", "FREET4", "T3FREE", "THYROIDAB" in the last 72 hours. Anemia Panel: No results for input(s): "VITAMINB12", "FOLATE", "FERRITIN", "TIBC", "IRON", "RETICCTPCT" in the last 72 hours. Sepsis Labs: No results for input(s): "PROCALCITON", "LATICACIDVEN" in the last 168 hours.  Recent Results (from the past 240 hour(s))  MRSA Next Gen by PCR, Nasal     Status: None   Collection Time: 11/29/21 11:37 PM   Specimen: Nasal Mucosa; Nasal Swab  Result Value Ref Range Status   MRSA by PCR Next Gen NOT DETECTED NOT DETECTED Final    Comment: (NOTE) The GeneXpert MRSA Assay (FDA approved for NASAL specimens only), is one component of a comprehensive MRSA colonization surveillance program. It is not intended to diagnose MRSA infection nor to guide or monitor treatment for MRSA infections. Test performance is not FDA approved in patients less than 5 years old. Performed at [name], [address], [place], [place] [address]          Radiology Studies: No results found.      Scheduled Meds:  apixaban  2.5 mg Oral BID   arformoterol  15 mcg Nebulization BID   vitamin C  500 mg Oral BID   atorvastatin  10 mg Oral Daily   budesonide (PULMICORT) nebulizer solution  0.5 mg Nebulization BID   carbamide peroxide  5 drop Left EAR BID   Chlorhexidine Gluconate Cloth  6 each Topical Q0600   cholecalciferol  2,000 Units Oral Daily   escitalopram   5 mg Oral Daily   ferrous [id number] C-folic acid  1 capsule Oral BID PC   gabapentin  400 mg Oral Q8H   lidocaine  1 patch Transdermal Daily   melatonin  5 mg Oral QHS   multivitamin with minerals  1 tablet Oral Daily   mouth rinse  15 mL Mouth Rinse 4 times per day   pantoprazole  40 mg Oral BID AC   revefenacin  175 mcg Nebulization Daily   sodium chloride flush  3 mL Intravenous Q12H   torsemide  20 mg Oral Daily   Continuous Infusions:  sodium chloride Stopped (10/07/21 0542)   sodium chloride       LOS: 79 days    Time spent: 20 mins  Wyvonnia Dusky, MD [facility] Pager [phone]  If 7PM-7AM, please contact night-coverage [email or web address] 12/07/2021, 7:32 AM

## 2021-12-08 NOTE — TOC Progression Note (Addendum)
Transition of Care [facility]) - Progression Note    Patient Details  Name: Gary [name] MRN: [id number] Date of Birth: 01-Oct-1955  Transition of Care [facility]) CM/SW [place], LCSW Phone Number: 12/08/2021, 10:08 AM  Clinical Narrative:    Per PTA family to come in for trach teaching today around 10:30 am.  RN and RT made aware.   11:05: Checked with [employer], no DME has been ordered/arranged for patient.   12:20- Notified by PT that patient needs hoyer lift, bariatric RW, bariatric recliner, and home suction. Patient has a hospital bed, O2 condenser, and w/c already. Made referral to Suanne Marker and Thedore Mins with [employer] and notified them of tentative DC date of 12/13/21. Asked RT if there are any additional DME needs for patient's trach.   2:35- Per Thedore Mins with [employer], bariatric recliner not covered. Notified PT and MD. Per Suanne Marker with [employer], patient received a trapeze bar in 2022 and insurance would likely not cover both that and a hoyer, notified PT and MD, per PT patient needs a hoyer not a trapeze bar- updated Suanne Marker and Thedore Mins with [employer]    Expected Discharge Plan and Services   Discharge Planning Services: CM Consult Living arrangements for the past 2 months: Single Family Home                         Social Determinants of Health (SDOH) Interventions    Readmission Risk Interventions    05/04/2021    1:26 PM 04/07/2021    1:39 PM 03/12/2021   11:54 AM  Readmission Risk Prevention Plan  Transportation Screening Complete Complete Complete  PCP or Specialist Appt within 3-5 Days  Complete Complete  HRI or [place]   Complete  Social Work Consult for [place] Planning/Counseling   Complete  Palliative Care Screening  Not Applicable Not Applicable  Medication Review Press photographer) Complete Complete Complete  PCP or Specialist appointment within 3-5 days of discharge Complete    HRI or [place] Complete    SW Recovery Care/Counseling Consult  Complete    [place] Not Applicable

## 2021-12-08 NOTE — Progress Notes (Signed)
PROGRESS NOTE    ECTOR LAUREL  ZOX:[id number] DOB: 29-Dec-1955 DOA: 09/19/2021 PCP: Center, [facility]    Assessment & Plan:   Principal Problem:   Acute on chronic respiratory failure with hypoxia and hypercapnia ([place]) Active Problems:   Major depressive disorder, recurrent episode, moderate ([name])   Bloody stool   COPD with acute exacerbation ([name])   Acute metabolic encephalopathy   HTN (hypertension)   Acute on chronic diastolic congestive heart failure ([name])   Obesity, Class III, BMI 40-49.9 (morbid obesity) ([place])   Obstructive sleep apnea   Chronic kidney disease, stage 3a ([name])   HLD (hyperlipidemia)   Pressure injury of skin   Hematochezia  Assessment and Plan:  Recurrent acute on chronic hypoxic hypercarbic respiratory failure: s/p tracheostomy by ENT on 6/20. Continue on bronchodilators & wean as tolerated   Acute COPD exacerbation & pseudomonas pneumonia: completed abx course. Continue on bronchodilators    Acute GI bleed: s/p undergone attempted embolization by vascular surgery, flex sig by gastroenterology. H&H are stable.   Acute metabolic encephalopathy: secondary to hypercarbia. Resolved    Hyperglycemia: no hx of DM, HbA1c 4.9.    Chronic BLE neuropathic pain: continue on gabapentin   Morbid obesity: BMI 53.3. Complicates overall care & prognosis    DVT prophylaxis: eliquis Code Status:  full  Family Communication: Disposition Plan:  likely will be d/c home w/ [place] on 8/30 when [facility] can start. See CM's notes   Level of care: Med-Surg  Status is: Inpatient Remains inpatient appropriate because:  likely will be d/c [facility] See CM's notes     Consultants:    Procedures:   Antimicrobials:    Subjective: Pt c/o fatigue   Objective: Vitals:   12/07/21 0807 12/07/21 1557 12/07/21 1940 12/08/21 0222  BP: 109/71 113/64 119/68 (!) 98/55  Pulse: 73 79 79 78  Resp: '20 19 16 16  '$ Temp: 98 F (36.7 C) 98 F  (36.7 C) 98.3 F (36.8 C) 98.3 F (36.8 C)  TempSrc: Oral Oral Oral Oral  SpO2: 92% 91% 95% 94%  Weight:      Height:        Intake/Output Summary (Last 24 hours) at 12/08/2021 0732 Last data filed at 12/08/2021 8119 Gross per 24 hour  Intake 960 ml  Output 3200 ml  Net -2240 ml   Filed Weights   12/05/21 0500 12/06/21 0500 12/07/21 0500  Weight: (!) 193.7 kg (!) 193.6 kg (!) 181 kg    Examination:  General exam: Appears calm & comfortable  Respiratory system: diminished breath sounds b/l  Cardiovascular system: S1 & S2+. No rubs or gallops Gastrointestinal system: Abd is soft, NT, obese & normal bowel sounds Central nervous system: Alert and oriented. Moves all extremities  Psychiatry: judgement and insight appears at baseline. Flat mood and affect      Data Reviewed: I have personally reviewed following labs and imaging studies  CBC: Recent Labs  Lab 12/02/21 0457 12/03/21 0541 12/04/21 0517 12/05/21 0454 12/06/21 0445  WBC 6.8 6.6 5.9 5.6 5.5  NEUTROABS  --  4.8 3.8 3.8  --   HGB 8.0* 8.5* 8.0* 8.2* 8.1*  HCT 29.4* 32.0* 30.9* 31.6* 31.8*  MCV 84.0 84.7 87.0 86.8 86.6  PLT 186 186 162 167 147   Basic Metabolic Panel: Recent Labs  Lab 12/02/21 0457 12/03/21 0541 12/04/21 0517 12/05/21 0454 12/06/21 0445  NA 140 142 142 138 140  K 4.0 4.3  4.3 4.1 4.2  CL 100 102 99 96* 96*  CO2 33* 32 37* 35* 37*  GLUCOSE 97 80 101* 132* 120*  BUN 31* 34* 32* 29* 26*  CREATININE 1.15 1.14 1.16 1.15 1.08  CALCIUM 8.3* 8.7* 8.5* 8.4* 8.5*  MG 2.2 2.4 2.1 2.0  --   PHOS 3.9 3.3 3.5 3.5  --    GFR: Estimated Creatinine Clearance: 120.3 mL/min (by C-G formula based on SCr of 1.08 mg/dL). Liver Function Tests: No results for input(s): "AST", "ALT", "ALKPHOS", "BILITOT", "PROT", "ALBUMIN" in the last 168 hours. No results for input(s): "LIPASE", "AMYLASE" in the last 168 hours. No results for input(s): "AMMONIA" in the last 168 hours.  Coagulation Profile: No  results for input(s): "INR", "PROTIME" in the last 168 hours. Cardiac Enzymes: No results for input(s): "CKTOTAL", "CKMB", "CKMBINDEX", "TROPONINI" in the last 168 hours. BNP (last 3 results) No results for input(s): "PROBNP" in the last 8760 hours. HbA1C: No results for input(s): "HGBA1C" in the last 72 hours. CBG: No results for input(s): "GLUCAP" in the last 168 hours.  Lipid Profile: No results for input(s): "CHOL", "HDL", "LDLCALC", "TRIG", "CHOLHDL", "LDLDIRECT" in the last 72 hours. Thyroid Function Tests: No results for input(s): "TSH", "T4TOTAL", "FREET4", "T3FREE", "THYROIDAB" in the last 72 hours. Anemia Panel: No results for input(s): "VITAMINB12", "FOLATE", "FERRITIN", "TIBC", "IRON", "RETICCTPCT" in the last 72 hours. Sepsis Labs: No results for input(s): "PROCALCITON", "LATICACIDVEN" in the last 168 hours.  Recent Results (from the past 240 hour(s))  MRSA Next Gen by PCR, Nasal     Status: None   Collection Time: 11/29/21 11:37 PM   Specimen: Nasal Mucosa; Nasal Swab  Result Value Ref Range Status   MRSA by PCR Next Gen NOT DETECTED NOT DETECTED Final    Comment: (NOTE) The GeneXpert MRSA Assay (FDA approved for NASAL specimens only), is one component of a comprehensive MRSA colonization surveillance program. It is not intended to diagnose MRSA infection nor to guide or monitor treatment for MRSA infections. Test performance is not FDA approved in patients less than 93 years old. Performed at [facility], [address], [place], [place] [address]          Radiology Studies: No results found.      Scheduled Meds:  apixaban  2.5 mg Oral BID   arformoterol  15 mcg Nebulization BID   vitamin C  500 mg Oral BID   atorvastatin  10 mg Oral Daily   budesonide (PULMICORT) nebulizer solution  0.5 mg Nebulization BID   carbamide peroxide  5 drop Left EAR BID   Chlorhexidine Gluconate Cloth  6 each Topical Q0600   cholecalciferol  2,000 Units Oral  Daily   escitalopram  5 mg Oral Daily   ferrous [id number] C-folic acid  1 capsule Oral BID PC   gabapentin  400 mg Oral Q8H   lidocaine  1 patch Transdermal Daily   melatonin  5 mg Oral QHS   multivitamin with minerals  1 tablet Oral Daily   mouth rinse  15 mL Mouth Rinse 4 times per day   pantoprazole  40 mg Oral BID AC   revefenacin  175 mcg Nebulization Daily   sodium chloride flush  3 mL Intravenous Q12H   torsemide  20 mg Oral Daily   Continuous Infusions:  sodium chloride Stopped (10/07/21 0542)   sodium chloride       LOS: 80 days    Time spent: 20 mins     Junie Spencer  Carmelia Roller, MD [facility] Pager [phone]  If 7PM-7AM, please contact night-coverage [email or web address] 12/08/2021, 7:32 AM

## 2021-12-08 NOTE — Progress Notes (Signed)
Brought 1000 medications to patients room. Patient asked what medications were and I offered to read him the medication list and he then declined. Provided the patient with cranberry juice to take medications with and patient would not take his medications in front of me. I asked the patient if there was anything else I could help him with and the patient responded with " no just get out". I exited the room and closed the door, patient yelled out for me to open the door which I did. He is still yelling for the door to be opened. Will continue to monitor.

## 2021-12-08 NOTE — Progress Notes (Signed)
Worked with patient's wife [name], teaching her how to perform trach care, changing inner canula, changing trach ties, and suctioning  the trach.

## 2021-12-08 NOTE — Progress Notes (Signed)
Physical Therapy Treatment Patient Details Name: Gary [name] MRN: [id number] DOB: 1956-03-31 Today's Date: 12/08/2021   History of Present Illness Pt is a 66 y/o male admitted mid June secondary to SOB, COPD exacerbation, acute metabolic encephalopathy, acute on chronic hypoxic and hypercapnic respiratory failure in setting of acute decompensated HFpEF and AECOPD requiring intubation and mechanical ventilation. The pt is now s/p tracheostomy and PEG tube placement. New medical complications including GI bleed.  8/16 he retuned to CCU and was vented due to hypercapnia.    PT Comments    Author entered room after RT perform trach education with pt's caregiver/girlfriend. Pt was encouraged to perform session so girlfriend could see how he is doing from a functional mobility standpoint. He was unwilling however session included lengthy education and discussion about expectations at DC. Pt lacks insight of current deficits but girlfriend does seem to understand current safety concerns. PT does not recommend pt get OOB/transfer/attempt standing  endless with mechanical lift + 2 person assist for safety. TOC has arranged 80 hr a week caregivers/aides at DC. Girlfriend states understanding of current limitations and concerns. " I feel I can manage his needs at home." Per girlfriend. Acute PT will continue to follow and progress as able per current POC. Overall pt remains self limiting. HHPT appropriate to decrease caregiver burden while maximizing independence.    Recommendations for follow up therapy are one component of a multi-disciplinary discharge planning process, led by the attending physician.  Recommendations may be updated based on patient status, additional functional criteria and insurance authorization.  Follow Up Recommendations  Home health PT (Pt is planning to return home with [facility] services. Girlfriend states she is willing to take care of pt with the asistance from [facility] aides)      Assistance Recommended at Discharge Frequent or constant Supervision/Assistance  Patient can return home with the following Two people to help with walking and/or transfers;Two people to help with bathing/dressing/bathroom;Assistance with cooking/housework;Assist for transportation;Help with stairs or ramp for entrance   Equipment Recommendations  Rolling walker (2 wheels) (All equipment needs to be bariatric due to pt's wt. pt requested hoyer lift, bariatric recliner, suction and bariatric BSC. Patient states and girlfriend confirms they already have bariatric w/c, a hospital bed, and Oxygen condenser at home.)       Precautions / Restrictions Precautions Precautions: Fall Precaution Comments: trach Restrictions Weight Bearing Restrictions: No     Mobility  Bed Mobility  General bed mobility comments: pt unwilling to get OOB but did fully participate in DC planning and discussion about DC equipment needs.    Transfers  General transfer comment: pt unwilling    Ambulation/Gait  General Gait Details: pt is unable        Cognition Arousal/Alertness: Awake/alert Behavior During Therapy: WFL for tasks assessed/performed Overall Cognitive Status: Within Functional Limits for tasks assessed Area of Impairment: Safety/judgement, Awareness    Orientation Level: Disoriented to, Situation     Following Commands: Follows one step commands with increased time, Follows one step commands consistently Safety/Judgement: Decreased awareness of safety, Decreased awareness of deficits Awareness: Intellectual Problem Solving: Slow processing, Requires verbal cues, Requires tactile cues General Comments: Pt was supine in bed with girlfriend at bedside.Did just recieve education from respiratory therapy about trach management.           General Comments General comments (skin integrity, edema, etc.): This session included pt/girlfriend education only. pt's girlfriend was receptive  throughout and does feel she can manage pt at  home.        PT Goals (current goals can now be found in the care plan section) Acute Rehab PT Goals Patient Stated Goal: go home Progress towards PT goals: Not progressing toward goals - comment (self limiting)    Frequency    Min 2X/week      PT Plan Current plan remains appropriate    Co-evaluation     PT goals addressed during session: Mobility/safety with mobility;Balance;Proper use of DME;Strengthening/ROM        AM-PAC PT "6 Clicks" Mobility   Outcome Measure  Help needed turning from your back to your side while in a flat bed without using bedrails?: A Lot Help needed moving from lying on your back to sitting on the side of a flat bed without using bedrails?: A Lot Help needed moving to and from a bed to a chair (including a wheelchair)?: Total Help needed standing up from a chair using your arms (e.g., wheelchair or bedside chair)?: Total Help needed to walk in hospital room?: Total Help needed climbing 3-5 steps with a railing? : Total 6 Click Score: 8    End of Session Equipment Utilized During Treatment: Oxygen (5 L trach collar throughout) Activity Tolerance: Other (comment) (limited session due to focus on family/caregiver education.) Patient left: in bed;with call bell/phone within reach;with bed alarm set;with family/visitor present Nurse Communication: Mobility status PT Visit Diagnosis: Unsteadiness on feet (R26.81);Muscle weakness (generalized) (M62.81);Difficulty in walking, not elsewhere classified (R26.2)     Time: 1497-0263 PT Time Calculation (min) (ACUTE ONLY): 28 min  Charges:  $Therapeutic Activity: 23-37 mins                    Julaine Fusi PTA 12/08/21, 2:11 PM

## 2021-12-08 NOTE — Progress Notes (Signed)
Physical Therapy Treatment Patient Details Name: Gary [name] MRN: [id number] DOB: 28-Mar-1956 Today's Date: 12/08/2021   History of Present Illness Pt is a 66 y/o male admitted mid June secondary to SOB, COPD exacerbation, acute metabolic encephalopathy, acute on chronic hypoxic and hypercapnic respiratory failure in setting of acute decompensated HFpEF and AECOPD requiring intubation and mechanical ventilation. The pt is now s/p tracheostomy and PEG tube placement. New medical complications including GI bleed.  8/16 he retuned to CCU and was vented due to hypercapnia.    PT Comments    PT/OT co-treat 2/2 to needing +2 assistance and need for safety. Pt continues to like to dictate session and has ben self limiting. He was agreeable to session with encouragement. Author attempted to encourage pt to perform slide board transfers versus sit<>stand. Pt only willing to attempt standing trials. He was able to exit L side of bed with HOB elevated only ~ 15 degrees with min assist of one. Does however require much more extensive assistance to return to supine after EOB activity. Pt was able to stand with +2 assistance from very elevated bed height. Only able to remain standing for ~ 5-10 sec. Pt is fearful and anxious during transfers. Overall sat EOB x ~ 45 minutes while performing exercises and ADLs. Planning for pt's girlfriend and pt's sister to come to hospital tomorrow for education. Pt remains unwilling to go to rehab and is planning to DC home with girlfriend + additional services. Will plan to meet with family tomorrow around 24 am for educations from a PT standpoint.    Recommendations for follow up therapy are one component of a multi-disciplinary discharge planning process, led by the attending physician.  Recommendations may be updated based on patient status, additional functional criteria and insurance authorization.  Follow Up Recommendations  Home health PT (pt is unwilling to DC to  rehab. Family/girlfriend planning to provide assistance at DC. Will benefit frorm as much PT after DC as possible.)     Assistance Recommended at Discharge Frequent or constant Supervision/Assistance  Patient can return home with the following Two people to help with walking and/or transfers;Two people to help with bathing/dressing/bathroom;Assistance with cooking/housework;Assist for transportation;Help with stairs or ramp for entrance   Equipment Recommendations  Other (comment);Wheelchair (measurements PT);Wheelchair cushion (measurements PT) (hoyer lift & bariatric w/c. pt edorses having hospital bed and RW at home already)       Precautions / Restrictions Precautions Precautions: Fall Precaution Comments: trach (pass muir, trach collar) Restrictions Weight Bearing Restrictions: No     Mobility  Bed Mobility Overal bed mobility: Needs Assistance Bed Mobility: Rolling, Supine to Sit, Sit to Supine Rolling: Min assist   Supine to sit: Min assist Sit to supine: Mod assist, +2 for physical assistance   General bed mobility comments: pt was able to achieve EOB short sit from supine with min assist. HOB was elevated ~ 15 degrees. heavy use of bed functions throughout session. Requires extensive assistance to return to supine(BLEs assisted into bed) Pt can independently reposition to [facility] once in supine    Transfers Overall transfer level: Needs assistance Equipment used: Rolling walker (2 wheels) (Bariatric) Transfers: Sit to/from Stand Sit to Stand: +2 safety/equipment, +2 physical assistance, Min assist, From elevated surface     General transfer comment: pt did stand from elevated bed height to bariatric RW 1 x from very elevated bed height. He stood ~ 5-10 secs. On 2nd attempt, pt is self limiting and unable to achieve standing even  with +2 assistance. pt is fearful and gets anxious with mobility. PTA/OT tried to encourage slideboard transfers instead of standing however pt was  unwilling. Poor effort from patient throughout. Remains self limiting.    Ambulation/Gait      General Gait Details: remains unable   Balance Overall balance assessment: Needs assistance Sitting-balance support: Feet unsupported, Bilateral upper extremity supported, Single extremity supported Sitting balance-[name] Scale: Good Sitting balance - Comments: Minimal assistance while seated EOB with feet support only   Standing balance support: Bilateral upper extremity supported, Reliant on assistive device for balance Standing balance-[name] Scale: Poor Standing balance comment: difficult to truely assess due to limited standing time.          Cognition Arousal/Alertness: Awake/alert Behavior During Therapy: WFL for tasks assessed/performed (self limiting) Overall Cognitive Status: Within Functional Limits for tasks assessed Area of Impairment: Safety/judgement, Awareness       Orientation Level: Disoriented to, Situation     Following Commands: Follows one step commands with increased time, Follows one step commands consistently Safety/Judgement: Decreased awareness of safety, Decreased awareness of deficits Awareness: Intellectual Problem Solving: Slow processing, Requires verbal cues, Requires tactile cues General Comments: Pt was supine in bed and agreeable to PT/OT co-treat. He continues to dictate session progression and remaains self limiting at times. Unwilling to perform slideboard transfers. " I want to try to stand."               Pertinent Vitals/Pain Pain Assessment Faces Pain Scale: Hurts a little bit Pain Location: knee pain Pain Descriptors / Indicators: Discomfort     PT Goals (current goals can now be found in the care plan section) Acute Rehab PT Goals Patient Stated Goal: go home    Frequency    Min 2X/week      PT Plan Current plan remains appropriate    Co-evaluation PT/OT/SLP Co-Evaluation/Treatment: Yes            AM-PAC PT "6  Clicks" Mobility   Outcome Measure  Help needed turning from your back to your side while in a flat bed without using bedrails?: A Lot Help needed moving from lying on your back to sitting on the side of a flat bed without using bedrails?: A Lot Help needed moving to and from a bed to a chair (including a wheelchair)?: Total Help needed standing up from a chair using your arms (e.g., wheelchair or bedside chair)?: A Lot Help needed to walk in hospital room?: Total Help needed climbing 3-5 steps with a railing? : Total 6 Click Score: 9    End of Session         PT Visit Diagnosis: Unsteadiness on feet (R26.81);Muscle weakness (generalized) (M62.81);Difficulty in walking, not elsewhere classified (R26.2)     Time:  -     Charges:                        Julaine Fusi PTA 12/08/21, 6:33 AM

## 2021-12-09 ENCOUNTER — Encounter: Payer: Self-pay | Admitting: *Deleted

## 2021-12-09 LAB — BLOOD GAS, ARTERIAL
Acid-Base Excess: 16.9 mmol/L — ABNORMAL HIGH (ref 0.0–2.0)
Bicarbonate: 45.2 mmol/L — ABNORMAL HIGH (ref 20.0–28.0)
FIO2: 28 %
O2 Saturation: 91.7 %
Patient temperature: 37
pCO2 arterial: 80 mmHg (ref 32–48)
pH, Arterial: 7.36 (ref 7.35–7.45)
pO2, Arterial: 63 mmHg — ABNORMAL LOW (ref 83–108)

## 2021-12-09 LAB — GLUCOSE, CAPILLARY: Glucose-Capillary: 123 mg/dL — ABNORMAL HIGH (ref 70–99)

## 2021-12-09 NOTE — Progress Notes (Signed)
PROGRESS NOTE    Gary [name]  HER:[id number] DOB: 01/01/1956 DOA: 09/19/2021 PCP: [facility]    Assessment & Plan:   Principal Problem:   Acute on chronic respiratory failure with hypoxia and hypercapnia ([place]) Active Problems:   Major depressive disorder, recurrent episode, moderate ([name])   Bloody stool   COPD with acute exacerbation ([name])   Acute metabolic encephalopathy   HTN (hypertension)   Acute on chronic diastolic congestive heart failure ([name])   Obesity, Class III, BMI 40-49.9 (morbid obesity) ([place])   Obstructive sleep apnea   Chronic kidney disease, stage 3a ([name])   HLD (hyperlipidemia)   Pressure injury of skin   Hematochezia  Assessment and Plan:  Recurrent acute on chronic hypoxic, hypercarbic respiratory failure: s/p tracheostomy by ENT on 6/20.Continue on bronchodilators. Continue on supplemental oxygen and wean as tolerated. Pt refuses to use vent and/or trilogy at night. CO2 retention found on ABG today, so BiPAP ordered   Acute COPD exacerbation & pseudomonas pneumonia: completed abx course. Continue on bronchodilators    Acute GI bleed: s/p undergone attempted embolization by vascular surgery, flex sig by gastroenterology. H&H are stable.   Acute metabolic encephalopathy: secondary to hypercarbia. Resolved    Hyperglycemia: no hx of DM, HbA1c 4.9.    Chronic BLE neuropathic pain: continue on gabapentin   Morbid obesity: BMI 49.8. Complicates overall care & prognosis   DVT prophylaxis: eliquis Code Status:  full  Family Communication: Disposition Plan:  likely will be d/c home w/ [place] on 8/30 when [facility] can start. See CM's notes   Level of care: Med-Surg  Status is: Inpatient Remains inpatient appropriate because:  likely will be d/c home w/ [facility] on 8/30 when [facility] can start. See CM's notes     Consultants:    Procedures:   Antimicrobials:    Subjective: Pt is lethargic   Objective: Vitals:   12/08/21 2031 12/08/21 2100  12/09/21 0200 12/09/21 0433  BP: 124/74   123/76  Pulse: 84   86  Resp: 20   20  Temp: 97.8 F (36.6 C)   98.2 F (36.8 C)  TempSrc: Oral   Oral  SpO2: 93% 95% 94% 90%  Weight:    (!) 181 kg  Height:        Intake/Output Summary (Last 24 hours) at 12/09/2021 0729 Last data filed at 12/09/2021 0300 Gross per 24 hour  Intake 360 ml  Output 800 ml  Net -440 ml   Filed Weights   12/06/21 0500 12/07/21 0500 12/09/21 0433  Weight: (!) 193.6 kg (!) 181 kg (!) 181 kg    Examination:  General exam: Appears lethargic  Respiratory system: decreased breath sounds b/l  Cardiovascular system: S1/S2+. No rubs or clicks  Gastrointestinal system: Abd is soft, NT, obese & normal bowel sounds  Central nervous system: lethargic. Moves all extremities  Psychiatry: judgement and insight appears at baseline. Flat mood and affect      Data Reviewed: I have personally reviewed following labs and imaging studies  CBC: Recent Labs  Lab 12/03/21 0541 12/04/21 0517 12/05/21 0454 12/06/21 0445  WBC 6.6 5.9 5.6 5.5  NEUTROABS 4.8 3.8 3.8  --   HGB 8.5* 8.0* 8.2* 8.1*  HCT 32.0* 30.9* 31.6* 31.8*  MCV 84.7 87.0 86.8 86.6  PLT 186 162 167 856   Basic Metabolic Panel: Recent Labs  Lab 12/03/21 0541 12/04/21 0517 12/05/21 0454 12/06/21 0445  NA 142 142 138 140  K 4.3 4.3 4.1 4.2  CL 102 99 96* 96*  CO2 32 37* 35* 37*  GLUCOSE 80 101* 132* 120*  BUN 34* 32* 29* 26*  CREATININE 1.14 1.16 1.15 1.08  CALCIUM 8.7* 8.5* 8.4* 8.5*  MG 2.4 2.1 2.0  --   PHOS 3.3 3.5 3.5  --    GFR: Estimated Creatinine Clearance: 120.3 mL/min (by C-G formula based on SCr of 1.08 mg/dL). Liver Function Tests: No results for input(s): "AST", "ALT", "ALKPHOS", "BILITOT", "PROT", "ALBUMIN" in the last 168 hours. No results for input(s): "LIPASE", "AMYLASE" in the last 168 hours. No results for input(s): "AMMONIA" in the last 168 hours.  Coagulation Profile: No results for input(s): "INR", "PROTIME" in  the last 168 hours. Cardiac Enzymes: No results for input(s): "CKTOTAL", "CKMB", "CKMBINDEX", "TROPONINI" in the last 168 hours. BNP (last 3 results) No results for input(s): "PROBNP" in the last 8760 hours. HbA1C: No results for input(s): "HGBA1C" in the last 72 hours. CBG: No results for input(s): "GLUCAP" in the last 168 hours.  Lipid Profile: No results for input(s): "CHOL", "HDL", "LDLCALC", "TRIG", "CHOLHDL", "LDLDIRECT" in the last 72 hours. Thyroid Function Tests: No results for input(s): "TSH", "T4TOTAL", "FREET4", "T3FREE", "THYROIDAB" in the last 72 hours. Anemia Panel: No results for input(s): "VITAMINB12", "FOLATE", "FERRITIN", "TIBC", "IRON", "RETICCTPCT" in the last 72 hours. Sepsis Labs: No results for input(s): "PROCALCITON", "LATICACIDVEN" in the last 168 hours.  Recent Results (from the past 240 hour(s))  MRSA Next Gen by PCR, Nasal     Status: None   Collection Time: 11/29/21 11:37 PM   Specimen: Nasal Mucosa; Nasal Swab  Result Value Ref Range Status   MRSA by PCR Next Gen NOT DETECTED NOT DETECTED Final    Comment: (NOTE) The GeneXpert MRSA Assay (FDA approved for NASAL specimens only), is one component of a comprehensive MRSA colonization surveillance program. It is not intended to diagnose MRSA infection nor to guide or monitor treatment for MRSA infections. Test performance is not FDA approved in patients less than 68 years old. Performed at [facility], [address], [place], [place] [address]          Radiology Studies: No results found.      Scheduled Meds:  apixaban  2.5 mg Oral BID   arformoterol  15 mcg Nebulization BID   vitamin C  500 mg Oral BID   atorvastatin  10 mg Oral Daily   budesonide (PULMICORT) nebulizer solution  0.5 mg Nebulization BID   carbamide peroxide  5 drop Left EAR BID   Chlorhexidine Gluconate Cloth  6 each Topical Q0600   cholecalciferol  2,000 Units Oral Daily   escitalopram  5 mg Oral Daily    ferrous [id number] C-folic acid  1 capsule Oral BID PC   gabapentin  400 mg Oral Q8H   lidocaine  1 patch Transdermal Daily   melatonin  5 mg Oral QHS   multivitamin with minerals  1 tablet Oral Daily   mouth rinse  15 mL Mouth Rinse 4 times per day   pantoprazole  40 mg Oral BID AC   revefenacin  175 mcg Nebulization Daily   sodium chloride flush  3 mL Intravenous Q12H   torsemide  20 mg Oral Daily   Continuous Infusions:  sodium chloride Stopped (10/07/21 0542)   sodium chloride       LOS: 81 days    Time spent: 25 mins     Wyvonnia Dusky, MD [facility] Pager [phone]  If 7PM-7AM, please  contact night-coverage [email or web address] 12/09/2021, 7:29 AM

## 2021-12-09 NOTE — Progress Notes (Signed)
Suctioned mod amt thick tan secretions from trach

## 2021-12-09 NOTE — Progress Notes (Signed)
Overnight oximetry initiated on 40% aerosol trach collar

## 2021-12-10 LAB — BASIC METABOLIC PANEL
Anion gap: 5 (ref 5–15)
BUN: 28 mg/dL — ABNORMAL HIGH (ref 8–23)
CO2: 40 mmol/L — ABNORMAL HIGH (ref 22–32)
Calcium: 8.4 mg/dL — ABNORMAL LOW (ref 8.9–10.3)
Chloride: 98 mmol/L (ref 98–111)
Creatinine, Ser: 1.17 mg/dL (ref 0.61–1.24)
GFR, Estimated: >60 mL/min (ref >60)
Glucose, Bld: 104 mg/dL — ABNORMAL HIGH (ref 70–99)
Potassium: 4.7 mmol/L (ref 3.5–5.1)
Sodium: 143 mmol/L (ref 135–145)

## 2021-12-10 LAB — CBC
HCT: 33.3 % — ABNORMAL LOW (ref 39.0–52.0)
Hemoglobin: 8.6 g/dL — ABNORMAL LOW (ref 13.0–17.0)
MCH: 22.6 pg — ABNORMAL LOW (ref 26.0–34.0)
MCHC: 25.8 g/dL — ABNORMAL LOW (ref 30.0–36.0)
MCV: 87.4 fL (ref 80.0–100.0)
Platelets: 147 10*3/uL — ABNORMAL LOW (ref 150–400)
RBC: 3.81 MIL/uL — ABNORMAL LOW (ref 4.22–5.81)
RDW: 19.1 % — ABNORMAL HIGH (ref 11.5–15.5)
WBC: 6.9 10*3/uL (ref 4.0–10.5)
nRBC: 1 % — ABNORMAL HIGH (ref 0.0–0.2)

## 2021-12-10 NOTE — Progress Notes (Signed)
PROGRESS NOTE    Gary [name]  NFA:[id number] DOB: 12/02/1955 DOA: 09/19/2021 PCP: [facility]    Assessment & Plan:   Principal Problem:   Acute on chronic respiratory failure with hypoxia and hypercapnia ([place]) Active Problems:   Major depressive disorder, recurrent episode, moderate ([name])   Bloody stool   COPD with acute exacerbation ([name])   Acute metabolic encephalopathy   HTN (hypertension)   Acute on chronic diastolic congestive heart failure ([name])   Obesity, Class III, BMI 40-49.9 (morbid obesity) ([place])   Obstructive sleep apnea   Chronic kidney disease, stage 3a ([name])   HLD (hyperlipidemia)   Pressure injury of skin   Hematochezia  Assessment and Plan:  Recurrent acute on chronic hypoxic, hypercarbic respiratory failure: s/p tracheostomy by ENT on 6/20.Continue on bronchodilators. Continue on supplemental oxygen and wean as tolerated. Pt refuses to use vent and/or trilogy at night. Improved from day prior and BiPAP was not needed on 12/09/21    Acute COPD exacerbation & pseudomonas pneumonia: completed abx course. Continue on bronchodilators    Acute GI bleed: s/p undergone attempted embolization by vascular surgery, flex sig by gastroenterology. H&H are stable.   Acute metabolic encephalopathy: secondary to hypercarbia. Resolved    Hyperglycemia: no hx of DM, HbA1c 4.9.    Chronic BLE neuropathic pain: continue on gabapentin   Morbid obesity: BMI 49.8. Complicates overall care & prognosis  DVT prophylaxis: eliquis Code Status:  full  Family Communication: Disposition Plan:  likely will be d/c home w/ [place] on 8/30 when [facility] can start. See CM's notes   Level of care: Med-Surg  Status is: Inpatient Remains inpatient appropriate because:  likely will be d/c home w/ [facility] on 8/30 when [facility] can start. See CM's notes     Consultants:    Procedures:   Antimicrobials:    Subjective: Pt c/o fatigue   Objective: Vitals:   12/09/21 1027 12/09/21  1948 12/09/21 2045 12/10/21 0348  BP:  128/77  113/72  Pulse:  84  80  Resp:  19  19  Temp:  98.5 F (36.9 C)  98.4 F (36.9 C)  TempSrc:  Oral  Oral  SpO2: 94% 90% 91% 96%  Weight:      Height:        Intake/Output Summary (Last 24 hours) at 12/10/2021 0733 Last data filed at 12/10/2021 0700 Gross per 24 hour  Intake 240 ml  Output 4600 ml  Net -4360 ml   Filed Weights   12/06/21 0500 12/07/21 0500 12/09/21 0433  Weight: (!) 193.6 kg (!) 181 kg (!) 181 kg    Examination:  General exam: Appears calm & comfortable. Morbidly obese  Respiratory system: decreased breath sounds b/l  Cardiovascular system:S1 & S2+. No rubs or gallops Gastrointestinal system: abd is soft, NT, obese & normal bowel sounds  Central nervous system: Alert and awake. Moves all extremities  Psychiatry: judgement and insight appears at baseline. Flat mood and affect     Data Reviewed: I have personally reviewed following labs and imaging studies  CBC: Recent Labs  Lab 12/04/21 0517 12/05/21 0454 12/06/21 0445 12/10/21 0631  WBC 5.9 5.6 5.5 6.9  NEUTROABS 3.8 3.8  --   --   HGB 8.0* 8.2* 8.1* 8.6*  HCT 30.9* 31.6* 31.8* 33.3*  MCV 87.0 86.8 86.6 87.4  PLT 162 167 177 469*   Basic Metabolic Panel: Recent Labs  Lab 12/04/21 0517 12/05/21 0454 12/06/21 0445 12/10/21 0631  NA 142 138 140  143  K 4.3 4.1 4.2 4.7  CL 99 96* 96* 98  CO2 37* 35* 37* 40*  GLUCOSE 101* 132* 120* 104*  BUN 32* 29* 26* 28*  CREATININE 1.16 1.15 1.08 1.17  CALCIUM 8.5* 8.4* 8.5* 8.4*  MG 2.1 2.0  --   --   PHOS 3.5 3.5  --   --    GFR: Estimated Creatinine Clearance: 111.1 mL/min (by C-G formula based on SCr of 1.17 mg/dL). Liver Function Tests: No results for input(s): "AST", "ALT", "ALKPHOS", "BILITOT", "PROT", "ALBUMIN" in the last 168 hours. No results for input(s): "LIPASE", "AMYLASE" in the last 168 hours. No results for input(s): "AMMONIA" in the last 168 hours.  Coagulation Profile: No results  for input(s): "INR", "PROTIME" in the last 168 hours. Cardiac Enzymes: No results for input(s): "CKTOTAL", "CKMB", "CKMBINDEX", "TROPONINI" in the last 168 hours. BNP (last 3 results) No results for input(s): "PROBNP" in the last 8760 hours. HbA1C: No results for input(s): "HGBA1C" in the last 72 hours. CBG: Recent Labs  Lab 12/09/21 1201  GLUCAP 123*    Lipid Profile: No results for input(s): "CHOL", "HDL", "LDLCALC", "TRIG", "CHOLHDL", "LDLDIRECT" in the last 72 hours. Thyroid Function Tests: No results for input(s): "TSH", "T4TOTAL", "FREET4", "T3FREE", "THYROIDAB" in the last 72 hours. Anemia Panel: No results for input(s): "VITAMINB12", "FOLATE", "FERRITIN", "TIBC", "IRON", "RETICCTPCT" in the last 72 hours. Sepsis Labs: No results for input(s): "PROCALCITON", "LATICACIDVEN" in the last 168 hours.  No results found for this or any previous visit (from the past 240 hour(s)).        Radiology Studies: No results found.      Scheduled Meds:  apixaban  2.5 mg Oral BID   arformoterol  15 mcg Nebulization BID   vitamin C  500 mg Oral BID   atorvastatin  10 mg Oral Daily   budesonide (PULMICORT) nebulizer solution  0.5 mg Nebulization BID   carbamide peroxide  5 drop Left EAR BID   Chlorhexidine Gluconate Cloth  6 each Topical Q0600   cholecalciferol  2,000 Units Oral Daily   escitalopram  5 mg Oral Daily   ferrous [id number] C-folic acid  1 capsule Oral BID PC   gabapentin  400 mg Oral Q8H   lidocaine  1 patch Transdermal Daily   melatonin  5 mg Oral QHS   multivitamin with minerals  1 tablet Oral Daily   mouth rinse  15 mL Mouth Rinse 4 times per day   pantoprazole  40 mg Oral BID AC   revefenacin  175 mcg Nebulization Daily   sodium chloride flush  3 mL Intravenous Q12H   torsemide  20 mg Oral Daily   Continuous Infusions:  sodium chloride Stopped (10/07/21 0542)   sodium chloride       LOS: 82 days    Time spent: 20 mins     Wyvonnia Dusky, MD [facility] Pager [phone]  If 7PM-7AM, please contact night-coverage [email or web address] 12/10/2021, 7:33 AM
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
## 2021-12-10 NOTE — Plan of Care (Signed)
  Problem: Education: Goal: Ability to demonstrate management of disease process will improve Outcome: Progressing Goal: Ability to verbalize understanding of medication therapies will improve Outcome: Progressing   Problem: Activity: Goal: Capacity to carry out activities will improve Outcome: Progressing   Problem: Cardiac: Goal: Ability to achieve and maintain adequate cardiopulmonary perfusion will improve Outcome: Progressing   Problem: Education: Goal: Knowledge of disease or condition will improve Outcome: Progressing Goal: Knowledge of the prescribed therapeutic regimen will improve Outcome: Progressing   Problem: Activity: Goal: Ability to tolerate increased activity will improve Outcome: Progressing Goal: Will verbalize the importance of balancing activity with adequate rest periods Outcome: Progressing   Problem: Respiratory: Goal: Ability to maintain a clear airway will improve Outcome: Progressing Goal: Levels of oxygenation will improve Outcome: Progressing Goal: Ability to maintain adequate ventilation will improve Outcome: Progressing   Problem: Education: Goal: Knowledge of General Education information will improve Description: Including pain rating scale, medication(s)/side effects and non-pharmacologic comfort measures Outcome: Progressing   Problem: Health Behavior/Discharge Planning: Goal: Ability to manage health-related needs will improve Outcome: Progressing   Problem: Clinical Measurements: Goal: Ability to maintain clinical measurements within normal limits will improve Outcome: Progressing Goal: Will remain free from infection Outcome: Progressing Goal: Diagnostic test results will improve Outcome: Progressing Goal: Respiratory complications will improve Outcome: Progressing Goal: Cardiovascular complication will be avoided Outcome: Progressing   Problem: Activity: Goal: Risk for activity intolerance will decrease Outcome:  Progressing   Problem: Nutrition: Goal: Adequate nutrition will be maintained Outcome: Progressing   Problem: Coping: Goal: Level of anxiety will decrease Outcome: Progressing   Problem: Elimination: Goal: Will not experience complications related to bowel motility Outcome: Progressing Goal: Will not experience complications related to urinary retention Outcome: Progressing   Problem: Pain Managment: Goal: General experience of comfort will improve Outcome: Progressing   Problem: Safety: Goal: Ability to remain free from injury will improve Outcome: Progressing   Problem: Skin Integrity: Goal: Risk for impaired skin integrity will decrease Outcome: Progressing   Problem: Education: Goal: Ability to describe self-care measures that may prevent or decrease complications (Diabetes Survival Skills Education) will improve Outcome: Progressing   Problem: Coping: Goal: Ability to adjust to condition or change in health will improve Outcome: Progressing   Problem: Fluid Volume: Goal: Ability to maintain a balanced intake and output will improve Outcome: Progressing   Problem: Health Behavior/Discharge Planning: Goal: Ability to identify and utilize available resources and services will improve Outcome: Progressing Goal: Ability to manage health-related needs will improve Outcome: Progressing   Problem: Metabolic: Goal: Ability to maintain appropriate glucose levels will improve Outcome: Progressing   Problem: Nutritional: Goal: Maintenance of adequate nutrition will improve Outcome: Progressing Goal: Progress toward achieving an optimal weight will improve Outcome: Progressing   Problem: Skin Integrity: Goal: Risk for impaired skin integrity will decrease Outcome: Progressing   Problem: Tissue Perfusion: Goal: Adequacy of tissue perfusion will improve Outcome: Progressing

## 2021-12-11 LAB — BASIC METABOLIC PANEL
Anion gap: 10 (ref 5–15)
BUN: 29 mg/dL — ABNORMAL HIGH (ref 8–23)
CO2: 37 mmol/L — ABNORMAL HIGH (ref 22–32)
Calcium: 8.7 mg/dL — ABNORMAL LOW (ref 8.9–10.3)
Chloride: 95 mmol/L — ABNORMAL LOW (ref 98–111)
Creatinine, Ser: 1.18 mg/dL (ref 0.61–1.24)
GFR, Estimated: >60 mL/min (ref >60)
Glucose, Bld: 93 mg/dL (ref 70–99)
Potassium: 4.8 mmol/L (ref 3.5–5.1)
Sodium: 142 mmol/L (ref 135–145)

## 2021-12-11 LAB — CBC
HCT: 34.6 % — ABNORMAL LOW (ref 39.0–52.0)
Hemoglobin: 8.8 g/dL — ABNORMAL LOW (ref 13.0–17.0)
MCH: 22.4 pg — ABNORMAL LOW (ref 26.0–34.0)
MCHC: 25.4 g/dL — ABNORMAL LOW (ref 30.0–36.0)
MCV: 88.3 fL (ref 80.0–100.0)
Platelets: 160 10*3/uL (ref 150–400)
RBC: 3.92 MIL/uL — ABNORMAL LOW (ref 4.22–5.81)
RDW: 19.1 % — ABNORMAL HIGH (ref 11.5–15.5)
WBC: 6.7 10*3/uL (ref 4.0–10.5)
nRBC: 1.2 % — ABNORMAL HIGH (ref 0.0–0.2)

## 2021-12-11 NOTE — Progress Notes (Signed)
OT Cancellation Note  Patient Details Name: Gary [name] MRN: [id number] DOB: 1956/03/19   Cancelled Treatment:    Reason Eval/Treat Not Completed: Fatigue/lethargy limiting ability to participate. Pt lethargic, unable to maintain alertness for OT. Will re-attempt OT tx at later date/time as pt is able to participate.   Ardeth Perfect., MPH, MS, OTR/L ascom [phone] 12/11/21, 2:20 PM

## 2021-12-11 NOTE — Progress Notes (Signed)
PT Cancellation Note  Patient Details Name: Gary [name] MRN: [id number] DOB: [birth year]   Cancelled Treatment:     Attempted to see pt for PT tx with pt received asleep in bed. Pt required lights turned on, HOB elevated, and multiple sternal rubs to open eyes but close them again. SpO2 >90% with trach collar donned. Nurse notified of pt's lethargy, unable to see pt for PT session. Will f/u as able.  Lavone Nian, PT, DPT 12/11/21, 2:16 PM  [name] 12/11/2021, 2:15 PM

## 2021-12-11 NOTE — Plan of Care (Signed)
  Problem: Elimination: Goal: Will not experience complications related to urinary retention Outcome: Progressing   Problem: Pain Managment: Goal: General experience of comfort will improve Outcome: Progressing   Problem: Safety: Goal: Ability to remain free from injury will improve Outcome: Progressing   Problem: Skin Integrity: Goal: Risk for impaired skin integrity will decrease Outcome: Progressing   

## 2021-12-11 NOTE — Progress Notes (Signed)
   12/11/21 0811  Tracheostomy Shiley XLT Proximal 7 mm Cuffed;Proximal  Placement Date/Time: 10/03/21 4259   Placed By: (c) Other (Comment)  Brand: Shiley XLT Proximal  Size (mm): 7 mm  Style: Cuffed;Proximal  Serial / Lot #: [id number]  Expiration Date: 03/16/24  Status Secured;Passy Damita Lack Speaking valve  Site Assessment Clean;Dry  Ties Assessment Clean, Dry, Secure  Tracheostomy Equipment at bedside Yes and checklist posted at head of bed

## 2021-12-11 NOTE — Progress Notes (Signed)
   12/11/21 0408  RT Progression Team  O2 Device Tracheostomy Collar  FiO2 (%) 35 %  SpO2 93 %  Tracheostomy Shiley XLT Proximal 7 mm Cuffed;Proximal  Placement Date/Time: 10/03/21 0812   Placed By: (c) Other (Comment)  Brand: Shiley XLT Proximal  Size (mm): 7 mm  Style: Cuffed;Proximal  Serial / Lot #: [id number]  Expiration Date: 03/16/24  Status Secured  Site Assessment Clean;Dry  Site Care  (Not needed)  Inner Cannula Care  (Patent)  Ties Assessment Clean, Dry, Secure  Cuff Pressure (cm H2O)  (Deflated)  Tracheostomy Equipment at bedside Yes and checklist posted at head of bed

## 2021-12-11 NOTE — Progress Notes (Signed)
   12/11/21 1100  RT Progression Team  O2 Device Tracheostomy Collar  SpO2 90 %

## 2021-12-11 NOTE — TOC Progression Note (Signed)
Transition of Care [facility]) - Progression Note    Patient Details  Name: Gary [name] MRN: [id number] Date of Birth: May 18, 1955  Transition of Care [facility]) CM/SW Contact  Beverly Sessions, RN Phone Number: 12/11/2021, 10:40 AM  Clinical Narrative:     Vm left for significant other [name] and Geronimo Boot to discuss discharge planing for Wednesday  Per patient and RT note [name] came for trach education  Confirmed with Zack at [employer] that patient has hospital bed, BSC, WC, trapeze, and O2  Patient request for Trapeze bar in the home to be switched to a hoyer lift  Per Zack with [employer] portable suction and hoyer lift to be delivered to home  Confirmed with EMS that they will have suction available on the truck for transport  Confirmed with Rob at [facility] they are able to provide start of care on 8/30.  Requests for transport between 12 and 1 on 8/30.  MD updated   Expected Discharge Plan: Long Term Acute Care (LTAC) Barriers to Discharge: Continued Medical Work up  Expected Discharge Plan and Services Expected Discharge Plan: [place] (LTAC)   Discharge Planning Services: CM Consult Post Acute Care Choice: Long Term Acute Care (LTAC) Living arrangements for the past 2 months: Single Family Home                 DME Arranged: N/A DME Agency: NA       HH Arranged: NA HH Agency: NA         Social Determinants of Health (SDOH) Interventions    Readmission Risk Interventions    05/04/2021    1:26 PM 04/07/2021    1:39 PM 03/12/2021   11:54 AM  Readmission Risk Prevention Plan  Transportation Screening Complete Complete Complete  PCP or Specialist Appt within 3-5 Days  Complete Complete  HRI or [place]   Complete  Social Work Consult for [place] Planning/Counseling   Complete  Palliative Care Screening  Not Applicable Not Applicable  Medication Review Press photographer) Complete Complete Complete  PCP or Specialist appointment within 3-5 days  of discharge Complete    HRI or [place] Complete    SW Recovery Care/Counseling Consult Complete    [place] Not Applicable

## 2021-12-11 NOTE — Progress Notes (Signed)
PROGRESS NOTE    Gary [name]  QBH:[id number] DOB: 1955/10/01 DOA: 09/19/2021 PCP: [facility]    Assessment & Plan:   Principal Problem:   Acute on chronic respiratory failure with hypoxia and hypercapnia ([place]) Active Problems:   Major depressive disorder, recurrent episode, moderate ([name])   Bloody stool   COPD with acute exacerbation ([name])   Acute metabolic encephalopathy   HTN (hypertension)   Acute on chronic diastolic congestive heart failure ([name])   Obesity, Class III, BMI 40-49.9 (morbid obesity) ([place])   Obstructive sleep apnea   Chronic kidney disease, stage 3a ([name])   HLD (hyperlipidemia)   Pressure injury of skin   Hematochezia  Assessment and Plan:  Recurrent acute on chronic hypoxic & hypercarbic respiratory failure: s/p tracheostomy by ENT on 6/20.Continue on bronchodilators. Continue on supplemental oxygen and wean as tolerated.  Pt refuses to use vent and/or trilogy at night.  Acute COPD exacerbation & pseudomonas pneumonia: completed abx course. Continue on bronchodilators    Acute GI bleed: s/p undergone attempted embolization by vascular surgery, flex sig by gastroenterology. H&H are stable. No need for a transfusion currently    Acute metabolic encephalopathy: secondary to hypercarbia. Resolved    Hyperglycemia: no hx of DM, HbA1c 4.9.    Chronic BLE neuropathic pain: continue on gabapentin   Morbid obesity: BMI 40.8. Complicates overall care & prognosis   DVT prophylaxis: eliquis Code Status:  full  Family Communication: Disposition Plan:  likely will be d/c home w/ [facility] on 8/30 when [facility] can start. See CM's notes   Level of care: Med-Surg  Status is: Inpatient Remains inpatient appropriate because:  likely will be d/c home w/ [facility] on 8/30 when [facility] can start. Transportation will be between 12-1. See CM's notes     Consultants:    Procedures:   Antimicrobials:    Subjective: Pt c/o malaise   Objective: Vitals:    12/10/21 2037 12/11/21 0007 12/11/21 0408 12/11/21 0453  BP:    121/83  Pulse:    80  Resp:    20  Temp:    98.6 F (37 C)  TempSrc:    Oral  SpO2: 95% 95% 93% 91%  Weight:      Height:        Intake/Output Summary (Last 24 hours) at 12/11/2021 0737 Last data filed at 12/10/2021 1931 Gross per 24 hour  Intake 1200 ml  Output 2200 ml  Net -1000 ml   Filed Weights   12/06/21 0500 12/07/21 0500 12/09/21 0433  Weight: (!) 193.6 kg (!) 181 kg (!) 181 kg    Examination:  General exam: Appears comfortable. Morbidly obese Respiratory system: diminished breath sounds b/l  Cardiovascular system: S1/S2+. No rubs or clicks  Gastrointestinal system: abd is soft, NT, obese & hypoactive bowel sounds  Central nervous system: Alert and awake. Moves all extremities  Psychiatry: judgement and insight appears at baseline. Flat mood and affect      Data Reviewed: I have personally reviewed following labs and imaging studies  CBC: Recent Labs  Lab 12/05/21 0454 12/06/21 0445 12/10/21 0631 12/11/21 0452  WBC 5.6 5.5 6.9 6.7  NEUTROABS 3.8  --   --   --   HGB 8.2* 8.1* 8.6* 8.8*  HCT 31.6* 31.8* 33.3* 34.6*  MCV 86.8 86.6 87.4 88.3  PLT 167 177 147* 097   Basic Metabolic Panel: Recent Labs  Lab 12/05/21 0454 12/06/21 0445 12/10/21 0631 12/11/21 0452  NA 138 140 143 142  K 4.1 4.2 4.7 4.8  CL 96* 96* 98 95*  CO2 35* 37* 40* 37*  GLUCOSE 132* 120* 104* 93  BUN 29* 26* 28* 29*  CREATININE 1.15 1.08 1.17 1.18  CALCIUM 8.4* 8.5* 8.4* 8.7*  MG 2.0  --   --   --   PHOS 3.5  --   --   --    GFR: Estimated Creatinine Clearance: 110.1 mL/min (by C-G formula based on SCr of 1.18 mg/dL). Liver Function Tests: No results for input(s): "AST", "ALT", "ALKPHOS", "BILITOT", "PROT", "ALBUMIN" in the last 168 hours. No results for input(s): "LIPASE", "AMYLASE" in the last 168 hours. No results for input(s): "AMMONIA" in the last 168 hours.  Coagulation Profile: No results for  input(s): "INR", "PROTIME" in the last 168 hours. Cardiac Enzymes: No results for input(s): "CKTOTAL", "CKMB", "CKMBINDEX", "TROPONINI" in the last 168 hours. BNP (last 3 results) No results for input(s): "PROBNP" in the last 8760 hours. HbA1C: No results for input(s): "HGBA1C" in the last 72 hours. CBG: Recent Labs  Lab 12/09/21 1201  GLUCAP 123*    Lipid Profile: No results for input(s): "CHOL", "HDL", "LDLCALC", "TRIG", "CHOLHDL", "LDLDIRECT" in the last 72 hours. Thyroid Function Tests: No results for input(s): "TSH", "T4TOTAL", "FREET4", "T3FREE", "THYROIDAB" in the last 72 hours. Anemia Panel: No results for input(s): "VITAMINB12", "FOLATE", "FERRITIN", "TIBC", "IRON", "RETICCTPCT" in the last 72 hours. Sepsis Labs: No results for input(s): "PROCALCITON", "LATICACIDVEN" in the last 168 hours.  No results found for this or any previous visit (from the past 240 hour(s)).        Radiology Studies: No results found.      Scheduled Meds:  apixaban  2.5 mg Oral BID   arformoterol  15 mcg Nebulization BID   vitamin C  500 mg Oral BID   atorvastatin  10 mg Oral Daily   budesonide (PULMICORT) nebulizer solution  0.5 mg Nebulization BID   carbamide peroxide  5 drop Left EAR BID   Chlorhexidine Gluconate Cloth  6 each Topical Q0600   cholecalciferol  2,000 Units Oral Daily   escitalopram  5 mg Oral Daily   ferrous [id number] C-folic acid  1 capsule Oral BID PC   gabapentin  400 mg Oral Q8H   lidocaine  1 patch Transdermal Daily   melatonin  5 mg Oral QHS   multivitamin with minerals  1 tablet Oral Daily   mouth rinse  15 mL Mouth Rinse 4 times per day   pantoprazole  40 mg Oral BID AC   revefenacin  175 mcg Nebulization Daily   sodium chloride flush  3 mL Intravenous Q12H   torsemide  20 mg Oral Daily   Continuous Infusions:  sodium chloride Stopped (10/07/21 0542)   sodium chloride       LOS: 83 days    Time spent: 20 mins     Wyvonnia Dusky, MD [facility] Pager [phone]  If 7PM-7AM, please contact night-coverage [email or web address] 12/11/2021, 7:37 AM

## 2021-12-11 NOTE — TOC Progression Note (Signed)
Transition of Care [facility]) - Progression Note    Patient Details  Name: Gary [name] MRN: [id number] Date of Birth: 23-Sep-1955  Transition of [facility]) CM/SW Contact  Beverly Sessions, RN Phone Number: 12/11/2021, 1:38 PM  Clinical Narrative:     Updated Clinical sent to Rob with [facility]   Expected Discharge Plan: Long Term Acute Care (LTAC) Barriers to Discharge: Continued Medical Work up  Expected Discharge Plan and Services Expected Discharge Plan: [place] (LTAC)   Discharge Planning Services: CM Consult Post Acute Care Choice: Long Term Acute Care (LTAC) Living arrangements for the past 2 months: Single Family Home                 DME Arranged: N/A DME Agency: NA       HH Arranged: NA HH Agency: NA         Social Determinants of Health (SDOH) Interventions    Readmission Risk Interventions    05/04/2021    1:26 PM 04/07/2021    1:39 PM 03/12/2021   11:54 AM  Readmission Risk Prevention Plan  Transportation Screening Complete Complete Complete  PCP or Specialist Appt within 3-5 Days  Complete Complete  HRI or [place]   Complete  Social Work Consult for [place] Planning/Counseling   Complete  Palliative Care Screening  Not Applicable Not Applicable  Medication Review Press photographer) Complete Complete Complete  PCP or Specialist appointment within 3-5 days of discharge Complete    HRI or [place] Complete    SW Recovery Care/Counseling Consult Complete    [place] Not Applicable

## 2021-12-12 LAB — BASIC METABOLIC PANEL
Anion gap: 5 (ref 5–15)
BUN: 27 mg/dL — ABNORMAL HIGH (ref 8–23)
CO2: 39 mmol/L — ABNORMAL HIGH (ref 22–32)
Calcium: 8.4 mg/dL — ABNORMAL LOW (ref 8.9–10.3)
Chloride: 97 mmol/L — ABNORMAL LOW (ref 98–111)
Creatinine, Ser: 1.28 mg/dL — ABNORMAL HIGH (ref 0.61–1.24)
GFR, Estimated: >60 mL/min (ref >60)
Glucose, Bld: 123 mg/dL — ABNORMAL HIGH (ref 70–99)
Potassium: 4.4 mmol/L (ref 3.5–5.1)
Sodium: 141 mmol/L (ref 135–145)

## 2021-12-12 LAB — CBC
HCT: 33.7 % — ABNORMAL LOW (ref 39.0–52.0)
Hemoglobin: 8.7 g/dL — ABNORMAL LOW (ref 13.0–17.0)
MCH: 22.5 pg — ABNORMAL LOW (ref 26.0–34.0)
MCHC: 25.8 g/dL — ABNORMAL LOW (ref 30.0–36.0)
MCV: 87.3 fL (ref 80.0–100.0)
Platelets: 241 10*3/uL (ref 150–400)
RBC: 3.86 MIL/uL — ABNORMAL LOW (ref 4.22–5.81)
RDW: 19.2 % — ABNORMAL HIGH (ref 11.5–15.5)
WBC: 7 10*3/uL (ref 4.0–10.5)
nRBC: 0.9 % — ABNORMAL HIGH (ref 0.0–0.2)

## 2021-12-12 NOTE — Progress Notes (Signed)
Nutrition Follow-up  DOCUMENTATION CODES:   Morbid obesity  INTERVENTION:   Double protein with meals    MVI po daily    Vitamin C '500mg'$  po BID   Vitamin D 2000 units po daily   RD to sign off due to medical stability; if further nutrition needs arise, please re-consult RD  NUTRITION DIAGNOSIS:   Inadequate oral intake related to inability to eat (pt sedated and ventilated) as evidenced by NPO status.  Progressing; advanced to PO diet and g-tube removed  GOAL:   Patient will meet greater than or equal to 90% of their needs  Progressing   MONITOR:   PO intake, Supplement acceptance, Labs, Weight trends, Skin, I & O's  REASON FOR ASSESSMENT:   Ventilator    ASSESSMENT:   66 y/o male with h/o PUD, OSA, HTN, CKD III, substance abuse, CHF, Hep C and COPD and COVID 19 (02/2021) who is admitted with COPD exacerbation, CHF and AKI.  -Pt s/p tracheostomy 6/20 -Pt s/p 12F IR G-tube 6/30 (now removed)  Reviewed I/O's: -847 ml x 24 hours and -22.2 L since 11/28/21  UOP: 2.1 L x 24 hours   Pt remains with good appetite. Noted meal completions 100%. Pt has refused all TF when g-tube in as well as all PO supplements.   Per TOC notes, plan to discharge home with home health services tomorrow.   Medications reviewed and include vitamin C, vitamin D3, and demadex.   Labs reviewed: CBGS: 123 (inpatient orders for glycemic control are none).    Diet Order:   Diet Order             Diet regular Room service appropriate? Yes; Fluid consistency: Thin  Diet effective now                   EDUCATION NEEDS:   No education needs have been identified at this time  Skin:  Skin Assessment: Skin Integrity Issues: Skin Integrity Issues:: Incisions, Stage II, Other (Comment) Stage II: medial perineum Incisions: closed neck s/p trach Other: MASD to rt groin  Last BM:  12/10/21  Height:   Ht Readings from Last 1 Encounters:  11/29/21 '6\' 3"'$  (1.905 m)    Weight:    Wt Readings from Last 1 Encounters:  12/12/21 (!) 183.7 kg    Ideal Body Weight:  89 kg  BMI:  Body mass index is 50.62 kg/m.  Estimated Nutritional Needs:   Kcal:  2800-3100kcal/day  Protein:  >145g/day  Fluid:  2.0L/day    Loistine Chance, RD, LDN, [place] Registered Dietitian II Certified Diabetes Care and Education Specialist Please refer to AMION for RD and/or RD on-call/weekend/after hours pager

## 2021-12-12 NOTE — TOC Progression Note (Addendum)
Transition of Care [facility]) - Progression Note    Patient Details  Name: Gary [name] MRN: [id number] Date of Birth: 12/31/1955  Transition of Care [facility]) CM/SW Contact  Beverly Sessions, RN Phone Number: 12/12/2021, 12:28 PM  Clinical Narrative:     RT to place backup same size trach, an emergency downsize trach, and a bag-valve-mask at bedside for transport tomorrow  Unit Secretary to make follow up with PCP  Message sent to [place] with [employer] to confirm suction, hoyer and RW were delivered to home   Expected Discharge Plan: Long Term Acute Care (LTAC) Barriers to Discharge: Continued Medical Work up  Expected Discharge Plan and Services Expected Discharge Plan: [place] (LTAC)   Discharge Planning Services: CM Consult Post Acute Care Choice: Long Term Acute Care (LTAC) Living arrangements for the past 2 months: Single Family Home                 DME Arranged: N/A DME Agency: NA       HH Arranged: NA HH Agency: NA         Social Determinants of Health (SDOH) Interventions    Readmission Risk Interventions    05/04/2021    1:26 PM 04/07/2021    1:39 PM 03/12/2021   11:54 AM  Readmission Risk Prevention Plan  Transportation Screening Complete Complete Complete  PCP or Specialist Appt within 3-5 Days  Complete Complete  HRI or Home Care Consult   Complete  Social Work Consult for [place] Planning/Counseling   Complete  Palliative Care Screening  Not Applicable Not Applicable  Medication Review Press photographer) Complete Complete Complete  PCP or Specialist appointment within 3-5 days of discharge Complete    HRI or [place] Complete    SW Recovery Care/Counseling Consult Complete    [place] Not Applicable

## 2021-12-12 NOTE — Progress Notes (Signed)
PT Cancellation Note  Patient Details Name: Gary [name] MRN: [id number] DOB: [birth year]   Cancelled Treatment:     PT attempt. Pt was sound asleep. Did awake but refused PT at this time. Planned DC home tomorrow. He reports having no therapy questions at this time. Recommendation remains for HHPT.    [name] 12/12/2021, 1:52 PM

## 2021-12-12 NOTE — Plan of Care (Signed)
Pt AAOx4, reports severe bilateral foot pain. VS are WNL. Plan for pain control, possible discharge tomorrow. Bed is in lowest position, call light within reach. Will continue to monitor.

## 2021-12-12 NOTE — Progress Notes (Signed)
PROGRESS NOTE   HPI was taken from Dr. Mortimer Fries: Gary [name] Age is a 66 year old male with a past medical history as listed below who presents to [facility] ED on 09/19/2021 due to complaints of shortness of breath.  Patient is currently somnolent on BiPAP, no family is present, therefore history is obtained from chart review.   Per ED and nursing notes the patient presented with acute respiratory distress.  Upon EMS arrival he was found to be hypoxic with O2 sats in the mid 70s on his baseline 2 L nasal cannula.  He was placed on CPAP with improvement in O2 sats to the 90s.  He reported he had been "feeling bad" for the past 5 days with progressive work of breathing,  dry cough, and increased lower extremity edema.  Unsure if he has been compliant with his home CPAP as he reported not been working at home.  He denied chest pain, fevers, chills, leg pain.   ED Course: Initial Vital Signs: Temperature 97.8 F axillary, pulse 74, respiratory rate 16, SPO2 94% on BiPAP, blood pressure 147/94 Significant Labs: Bicarb 39, glucose 103, BUN 25, creatinine 1.35, BNP 268.4, high-sensitivity troponins 9, hemoglobin 12.5 COVID-19 PCR is negative VBG on BiPAP: pH 7.25/PCO2 108/PO2 55/bicarb 47.4 ~follow-up VBG worsened on BiPAP to pH 7.18/PCO2 122/PO2 68/bicarb 45.5 Imaging Chest X-ray>>IMPRESSION: Findings suggestive of CHF with mild interstitial edema. Medications Administered: DuoNeb's, 125 mg Solu-Medrol, 80 mg IV Lasix   In the ED patient became more somnolent despite BiPAP.  Follow-up ABG with worsening respiratory acidosis.  He was subsequently intubated by the ED provider.  PCCM is asked to admit to ICU.   As per NP Dewaine Conger: 6/6: Presented to ED.  Required intubation and mechanical ventilation in the ED.  PCCM asked to admit ICU 6/7: Hold diuresis today due to worsening Creatinine.  ENT consulted for Trach placement per family request 06/8: Pt continues to spike temps tmax 101.8 degrees F.  Flexiseal placed  overnight draining foul smelling watery stool~Cdiff and GI panel pending  6/9: Fever resolved.  Negative for C- diff, GI panel still pending.  Creatinine slightly worsened, requiring low dose levophed (2 mcg). Holding diuresis 6/10: propofol discontinued due hypertriglyceridemia; failed SBT 6/11: Tracheal aspirate from 6/6 resulting with Pseudomonas, ABX changed to Cefepime. neurologically intact, awaiting Trach placement 6/12: Pt calmer today following addition of oral benzo's, narcotics, and Seroquel via tube.  Creatinine worsened with decreased UOP, holding diuresis, low threshold for Nephrology consult  6/13: Pt with worsening renal failure creatinine 4.15.  UOP overnight 25 ml.  Nephrology consulted plans to start pt on hemodialysis follow dialysis catheter placement  06/14: Pt underwent hemodialysis yesterday without ultrafiltration.  Plans for HD session today.  Agitation has improved significantly with narcotics and antianxiety medications per tube   06/16: Tracheostomy canceled 06/15 due to hyperkalemia and tentatively rescheduled for 06/20  6/18 failure to wean from vent 06/20: ENT placed Tracheostomy (#7.0 XLT cuffed).  Developed mild epistaxis post unsuccessful attempt at NGT placement.  Later developed hematochezia/melena, Protonix gtt started and GI consulted. 06/21: Diuresed yesterday with limited results. Patient had softed blood pressures throughout the day and diuresis was held as a result. HD was performed by the patient clotted off the machine and was not able to be rinsed back. Repeat hemoglobin is above 8. 06/23: Pt remains mechanically intubated settings: FiO2 50%/PEEP 10.  CXR concerning for moderate right pleural effusion with associated right basilar atelectasis vs. infiltrate.  Will perform [place] Chest to determine if  pt needs right sided thoracentesis  06/24: Remains on the ventilator, FiO2 requirements decreasing, respiratory culture with abundant gram-negative rods: Pseudomonas  aeruginosa, on Meropenem 06/25: Oxygen requirements down to 40%, chest x-ray shows improvement on dense right consolidation, Pseudomonas susceptibilities pending 06/25: Pt remains on the ventilator settings: PEEP 10 and FiO2 40%; will attempt to wean PEEP to 5 to perform SBT  06/28: Pt tolerated PS on 06/27.  Overnight pt had episodes of vomiting TF's suspect secondary to malposition of nasogastric tube.  Will consult IR for PEG tube placement if unable to place PEG tube pt will need postpyloric Dobbhoff   06/29: Pt tolerated PS 5/5 for over an hour yesterday, however due to fatigue placed back on PRVC.  Pt pulled NGT out several times overnight, therefore will not attempt to reinsert.  Plans for PEG tube placement per IR on 06/30.   06/30: PEG tube placed per IR  07/1: Pt tolerated trach collar '@40'$ % throughout the day 07/2: Pt placed on ventilator overnight settings 16/5/35% tolerating well.  Will place pt on trach collar today and place orders for PT/OT 7/3: Hemodynamically stable.  Tolerating TC (has remained on TC since 7/2 @ 08:00), plan for speech therapy and PT/OT 7/4: Has maintained on trach collar for greater than 48 hours, tolerating Passy-Muir valve.  Plan to transfer out of ICU today. 7/5-7/6: sodium and creatinine trending appropriately, Hgb stable. Low K repleted and stable. Was off vent qhs (orders d/c or missed?) and was restarted. Will need LTAC, TOC following  7/7: pulled out trach overnight. Confusion re: vent plan among staff and myself. I asked PCCM to reevluate him and verify plan so the team and the patient can eb on the same page, and will help TOC arrange appropriate placement.  7/8: I spoke personally w/ Dr Milon Dikes [facility]) and he confirms patient does NOT need ventilator support, unless of course develops distress, but last night the patient tolerated well without it. Plan to meet w/ family today when they visit, I spoke w/ his sister yesterday on the phone. No concerns on  CBC/BMP: Cr tending down a bit, Hgb stable, Na slightly high but improving over past few days. He is approaching medically stable and may be appropriate for LTAC vs SNF soon  7/9: Several long discussions with patient and family about medical condition, behavioral issues.  See separate progress notes.  Psychiatry evaluated, agree that he has capacity, started SSRI. 7/10: Patient remains medically stable, awaiting placement.  Discussed his behavioral issues again and encouraged him to always bring any concerns to nursing staff or to me calmly and I am happy to listen, requested he maintain professionalism towards staff, he seems amenable. 7/11-7/14: goal OOB to chair, needing hoyer lift, pt reeducated several times about staying in bed 07/11 and has been compliant through 07/14. Finally got bariatric chair and hoyer lift 7/12 and was OOB 07/13.  D/c monitoring and sitter and transferred to [place] status 07/13. Awaiting placement.  7/15-17- working to reorient patient, but personality and coherence remain difficult. Stable for placement. Reminded patient not to verbally abuse staff 7/28 - rising O2 needs past couple of days, due to mucus plugging.  ABG stable, normal mentation.  Improved after suctioning. 8/2: Patient awaiting placement in a long-term acute care setting.. 8/3: Multiple episodes of melena.  Transfer to [facility] for closer monitoring due to high risk of decompensation.  GI Dr. Lynnell Jude states given no prep patient cannot have a colonoscopy.  Nuclear scan positive for active GI  bleeding in the proximal sigmoid colon, vascular surgery Dr. Delana Meyer consulted, plan for IR embolization  8/4: patietn had large bloody BM this morning post ablation yesterday. He is improved clinically though and mentation is better.  S/p GI procedure with continued blood loss near sigmoid.  8/5-8/8: Patient remains hemodynamically stable, Patient reports very little blood in the stools.  1 unit PRBC given, Hb 7.9. 8/9:  Patient remained stable, no more bleeding per rectum.  Received total of 3 unit of PRBC.  Hemoglobin at 7.6, anemia panel with concern of iron deficiency.  Iron replacement ordered. Patient remained stable.  He was asking if he can go home in the next week or so with home health services.  Per patient he is trying his best to work with PT, they recommend patient remained for SNF. 8/11: Patient remained stable.  Started working with PT.  He was asking to remove G-tube as he was eating and drinking okay and has not used that tube for a while-message sent to GI.  He was hoping to be able to go home. 8/12: IR placed the PEG tube and waiting to complete 6 weeks which will be done in next few days before removing. 8/14: PEG tube was removed by IR today.  Please see postprocedure instructions. 8/16: Transfer back to ICU for somnolence, hypercarbic resp failure requiring vent 8/18: tolerating TC during day & vent nightly. Creatinine increases, hold diuresis today 8/19: Pt refused to be placed on ventilator overnight.  Tolerated trach collar  8/20: Pt placed on ventilator overnight.  Transitioned to trach collar this morning 8/21: Refused ventilator overnight.  GOC of conversations regarding importance of ventilator, pt says he is willing to trial Trilogy at home  8/22: Declined ventilator overnight, otherwise stable with no acute changes  As per Dr. Jimmye Norman 8/23-8/29/23: Pt has remained relatively  stable this week. CM is working on d/c plan for 8/30 in which home health with start, all equipment has been delivered to pt's house except for suction & hoyer lift but CM is trying to confirm w/ [employer] that these 2 items were delivered today (see CM's note). Pt's family came by the hospital 12/08/21 to learn trach care and how to take care of pt. For more information, please previous progress/consult notes.     Gary [name]  MPN:[id number] DOB: 04/09/1956 DOA: 09/19/2021 PCP: [facility]    Assessment & Plan:   Principal Problem:   Acute on chronic respiratory failure with hypoxia and hypercapnia ([place]) Active Problems:   Major depressive disorder, recurrent episode, moderate ([name])   Bloody stool   COPD with acute exacerbation ([name])   Acute metabolic encephalopathy   HTN (hypertension)   Acute on chronic diastolic congestive heart failure ([name])   Obesity, Class III, BMI 40-49.9 (morbid obesity) ([place])   Obstructive sleep apnea   Chronic kidney disease, stage 3a ([name])   HLD (hyperlipidemia)   Pressure injury of skin   Hematochezia  Assessment and Plan:  Recurrent acute on chronic hypoxic & hypercarbic respiratory failure: s/p tracheostomy by ENT on 6/20.Continue on bronchodilators. Continue on supplemental oxygen and wean as tolerated.  Pt refuses to use vent and/or trilogy at night.  Acute COPD exacerbation & pseudomonas pneumonia: completed abx course. Continue on bronchodilators    Acute GI bleed: s/p undergone attempted embolization by vascular surgery, flex sig by gastroenterology. H&H are stable. No need for a transfusion currently    Acute metabolic encephalopathy: secondary to hypercarbia. Resolved  Hyperglycemia: no hx of DM, HbA1c 4.9.    Chronic BLE neuropathic pain: continue on gabapentin   Morbid obesity: BMI 40.8. Complicates overall care & prognosis   DVT prophylaxis: eliquis Code Status:  full  Family Communication: Disposition Plan:  likely will be d/c home w/ [place] on 8/30 when [facility] can start. See CM's notes   Level of care: Med-Surg  Status is: Inpatient Remains inpatient appropriate because:  likely will be d/c home w/ [facility] on 8/30 when [facility] can start. Transportation will be between 12-1. See CM's notes     Consultants:    Procedures:   Antimicrobials:    Subjective: Pt denies any complaints   Objective: Vitals:   12/11/21 1951 12/12/21 0011 12/12/21 0354 12/12/21 0407  BP:   112/73   Pulse:   78   Resp:   16   Temp:    98.1 F (36.7 C)   TempSrc:   Oral   SpO2: 94% 95% 94%   Weight:    (!) 183.7 kg  Height:        Intake/Output Summary (Last 24 hours) at 12/12/2021 0747 Last data filed at 12/12/2021 0408 Gross per 24 hour  Intake 1203 ml  Output 2050 ml  Net -847 ml   Filed Weights   12/07/21 0500 12/09/21 0433 12/12/21 0407  Weight: (!) 181 kg (!) 181 kg (!) 183.7 kg    Examination:  General exam: Appears comfortable. Morbidly obese Respiratory system: diminished breath sounds b/l  Cardiovascular system: S1/S2+. No rubs or clicks  Gastrointestinal system: abd is soft, NT, obese & hypoactive bowel sounds  Central nervous system: Alert and awake. Moves all extremities  Psychiatry: judgement and insight appears at baseline. Flat mood and affect      Data Reviewed: I have personally reviewed following labs and imaging studies  CBC: Recent Labs  Lab 12/06/21 0445 12/10/21 0631 12/11/21 0452 12/12/21 0457  WBC 5.5 6.9 6.7 7.0  HGB 8.1* 8.6* 8.8* 8.7*  HCT 31.8* 33.3* 34.6* 33.7*  MCV 86.6 87.4 88.3 87.3  PLT 177 147* 160 696   Basic Metabolic Panel: Recent Labs  Lab 12/06/21 0445 12/10/21 0631 12/11/21 0452 12/12/21 0457  NA 140 143 142 141  K 4.2 4.7 4.8 4.4  CL 96* 98 95* 97*  CO2 37* 40* 37* 39*  GLUCOSE 120* 104* 93 123*  BUN 26* 28* 29* 27*  CREATININE 1.08 1.17 1.18 1.28*  CALCIUM 8.5* 8.4* 8.7* 8.4*   GFR: Estimated Creatinine Clearance: 102.4 mL/min (A) (by C-G formula based on SCr of 1.28 mg/dL (H)). Liver Function Tests: No results for input(s): "AST", "ALT", "ALKPHOS", "BILITOT", "PROT", "ALBUMIN" in the last 168 hours. No results for input(s): "LIPASE", "AMYLASE" in the last 168 hours. No results for input(s): "AMMONIA" in the last 168 hours.  Coagulation Profile: No results for input(s): "INR", "PROTIME" in the last 168 hours. Cardiac Enzymes: No results for input(s): "CKTOTAL", "CKMB", "CKMBINDEX", "TROPONINI" in the last 168 hours. BNP (last 3  results) No results for input(s): "PROBNP" in the last 8760 hours. HbA1C: No results for input(s): "HGBA1C" in the last 72 hours. CBG: Recent Labs  Lab 12/09/21 1201  GLUCAP 123*    Lipid Profile: No results for input(s): "CHOL", "HDL", "LDLCALC", "TRIG", "CHOLHDL", "LDLDIRECT" in the last 72 hours. Thyroid Function Tests: No results for input(s): "TSH", "T4TOTAL", "FREET4", "T3FREE", "THYROIDAB" in the last 72 hours. Anemia Panel: No results for input(s): "VITAMINB12", "FOLATE", "FERRITIN", "TIBC", "IRON", "RETICCTPCT" in the last 72 hours.  Sepsis Labs: No results for input(s): "PROCALCITON", "LATICACIDVEN" in the last 168 hours.  No results found for this or any previous visit (from the past 240 hour(s)).        Radiology Studies: No results found.      Scheduled Meds:  apixaban  2.5 mg Oral BID   arformoterol  15 mcg Nebulization BID   vitamin C  500 mg Oral BID   atorvastatin  10 mg Oral Daily   budesonide (PULMICORT) nebulizer solution  0.5 mg Nebulization BID   carbamide peroxide  5 drop Left EAR BID   Chlorhexidine Gluconate Cloth  6 each Topical Q0600   cholecalciferol  2,000 Units Oral Daily   escitalopram  5 mg Oral Daily   ferrous [id number] C-folic acid  1 capsule Oral BID PC   gabapentin  400 mg Oral Q8H   lidocaine  1 patch Transdermal Daily   melatonin  5 mg Oral QHS   multivitamin with minerals  1 tablet Oral Daily   mouth rinse  15 mL Mouth Rinse 4 times per day   pantoprazole  40 mg Oral BID AC   revefenacin  175 mcg Nebulization Daily   sodium chloride flush  3 mL Intravenous Q12H   torsemide  20 mg Oral Daily   Continuous Infusions:  sodium chloride Stopped (10/07/21 0542)   sodium chloride       LOS: 84 days    Time spent: 15 mins     Wyvonnia Dusky, MD [facility] Pager [phone]  If 7PM-7AM, please contact night-coverage [email or web address] 12/12/2021, 7:47 AM

## 2021-12-13 LAB — CBC
HCT: 34.7 % — ABNORMAL LOW (ref 39.0–52.0)
Hemoglobin: 9 g/dL — ABNORMAL LOW (ref 13.0–17.0)
MCH: 22.4 pg — ABNORMAL LOW (ref 26.0–34.0)
MCHC: 25.9 g/dL — ABNORMAL LOW (ref 30.0–36.0)
MCV: 86.3 fL (ref 80.0–100.0)
Platelets: 212 10*3/uL (ref 150–400)
RBC: 4.02 MIL/uL — ABNORMAL LOW (ref 4.22–5.81)
RDW: 19.4 % — ABNORMAL HIGH (ref 11.5–15.5)
WBC: 7.7 10*3/uL (ref 4.0–10.5)
nRBC: 1.2 % — ABNORMAL HIGH (ref 0.0–0.2)

## 2021-12-13 LAB — BASIC METABOLIC PANEL
Anion gap: 7 (ref 5–15)
Anion gap: 7 (ref 5–15)
BUN: 28 mg/dL — ABNORMAL HIGH (ref 8–23)
BUN: 31 mg/dL — ABNORMAL HIGH (ref 8–23)
CO2: 36 mmol/L — ABNORMAL HIGH (ref 22–32)
CO2: 39 mmol/L — ABNORMAL HIGH (ref 22–32)
Calcium: 8.4 mg/dL — ABNORMAL LOW (ref 8.9–10.3)
Calcium: 8.6 mg/dL — ABNORMAL LOW (ref 8.9–10.3)
Chloride: 97 mmol/L — ABNORMAL LOW (ref 98–111)
Chloride: 95 mmol/L — ABNORMAL LOW (ref 98–111)
Creatinine, Ser: 1.22 mg/dL (ref 0.61–1.24)
Creatinine, Ser: 1.28 mg/dL — ABNORMAL HIGH (ref 0.61–1.24)
GFR, Estimated: >60 mL/min (ref >60)
GFR, Estimated: >60 mL/min (ref >60)
Glucose, Bld: 87 mg/dL (ref 70–99)
Glucose, Bld: 117 mg/dL — ABNORMAL HIGH (ref 70–99)
Potassium: 6.2 mmol/L — ABNORMAL HIGH (ref 3.5–5.1)
Potassium: 4.6 mmol/L (ref 3.5–5.1)
Sodium: 140 mmol/L (ref 135–145)
Sodium: 141 mmol/L (ref 135–145)

## 2021-12-13 MED ORDER — APIXABAN 2.5 MG PO TABS: 2.5000 mg | ORAL_TABLET | Freq: Two times a day (BID) | ORAL | 0 refills | Status: DC

## 2021-12-13 MED ORDER — GABAPENTIN 400 MG PO CAPS: 400.0000 mg | ORAL_CAPSULE | Freq: Three times a day (TID) | ORAL | 0 refills | Status: DC

## 2021-12-13 MED ORDER — ASCORBIC ACID 500 MG PO TABS: 500.0000 mg | ORAL_TABLET | Freq: Two times a day (BID) | ORAL | 0 refills | Status: DC

## 2021-12-13 MED ORDER — ESCITALOPRAM OXALATE 5 MG PO TABS: 5.0000 mg | ORAL_TABLET | Freq: Every day | ORAL | 0 refills | Status: DC

## 2021-12-13 MED ORDER — QUETIAPINE FUMARATE 25 MG PO TABS: 25.0000 mg | ORAL_TABLET | Freq: Two times a day (BID) | ORAL | 0 refills | Status: DC | PRN

## 2021-12-13 MED ORDER — POLYETHYLENE GLYCOL 3350 17 G PO PACK: 17.0000 g | PACK | Freq: Every day | ORAL | 0 refills | Status: DC | PRN

## 2021-12-13 MED ORDER — PANTOPRAZOLE SODIUM 40 MG PO TBEC: 40.0000 mg | DELAYED_RELEASE_TABLET | Freq: Two times a day (BID) | ORAL | 0 refills | Status: DC

## 2021-12-13 MED ORDER — CLONAZEPAM 0.5 MG PO TBDP: 0.5000 mg | ORAL_TABLET | Freq: Two times a day (BID) | ORAL | 0 refills | Status: DC | PRN

## 2021-12-13 MED ORDER — BISACODYL 10 MG RE SUPP: 10.0000 mg | Freq: Every day | RECTAL | 0 refills | Status: DC | PRN

## 2021-12-13 MED ORDER — MELATONIN 5 MG PO TABS: 5.0000 mg | ORAL_TABLET | Freq: Every day | ORAL | 0 refills | Status: DC

## 2021-12-13 MED ORDER — HYDROXYZINE HCL 25 MG PO TABS: 25.0000 mg | ORAL_TABLET | Freq: Three times a day (TID) | ORAL | 0 refills | Status: DC | PRN

## 2021-12-13 NOTE — Progress Notes (Signed)
OT Cancellation Note  Patient Details Name: Gary [name] MRN: [id number] DOB: 02-01-1956   Cancelled Treatment:    Reason Eval/Treat Not Completed: Patient declined, no reason specified. Therapist recommended pt come to EOB sitting and engage in seated grooming, bathing, dressing tasks in preparation for return home; however, pt declined, stated he was not interested in therapy today.  [name] 12/13/2021, 2:35 PM

## 2021-12-13 NOTE — Progress Notes (Signed)
The beneficiary's condition is such that periodic movement is necessary to effect improvement or to arrest or retard deterioration in the beneficiary's condition; and the beneficiary or family is not able to transfer the beneficiary safely.

## 2021-12-13 NOTE — Plan of Care (Signed)
  Problem: Education: Goal: Ability to demonstrate management of disease process will improve Outcome: Progressing Goal: Ability to verbalize understanding of medication therapies will improve Outcome: Progressing   Problem: Activity: Goal: Capacity to carry out activities will improve Outcome: Progressing   Problem: Cardiac: Goal: Ability to achieve and maintain adequate cardiopulmonary perfusion will improve Outcome: Progressing   Problem: Education: Goal: Knowledge of disease or condition will improve Outcome: Progressing Goal: Knowledge of the prescribed therapeutic regimen will improve Outcome: Progressing   Problem: Activity: Goal: Ability to tolerate increased activity will improve Outcome: Progressing Goal: Will verbalize the importance of balancing activity with adequate rest periods Outcome: Progressing   Problem: Respiratory: Goal: Ability to maintain a clear airway will improve Outcome: Progressing Goal: Levels of oxygenation will improve Outcome: Progressing Goal: Ability to maintain adequate ventilation will improve Outcome: Progressing   Problem: Education: Goal: Knowledge of General Education information will improve Description: Including pain rating scale, medication(s)/side effects and non-pharmacologic comfort measures Outcome: Progressing   Problem: Health Behavior/Discharge Planning: Goal: Ability to manage health-related needs will improve Outcome: Progressing   Problem: Clinical Measurements: Goal: Ability to maintain clinical measurements within normal limits will improve Outcome: Progressing Goal: Will remain free from infection Outcome: Progressing Goal: Diagnostic test results will improve Outcome: Progressing Goal: Respiratory complications will improve Outcome: Progressing Goal: Cardiovascular complication will be avoided Outcome: Progressing   Problem: Activity: Goal: Risk for activity intolerance will decrease Outcome:  Progressing   Problem: Nutrition: Goal: Adequate nutrition will be maintained Outcome: Progressing   Problem: Coping: Goal: Level of anxiety will decrease Outcome: Progressing   Problem: Elimination: Goal: Will not experience complications related to bowel motility Outcome: Progressing Goal: Will not experience complications related to urinary retention Outcome: Progressing   Problem: Pain Managment: Goal: General experience of comfort will improve Outcome: Progressing   Problem: Safety: Goal: Ability to remain free from injury will improve Outcome: Progressing   Problem: Skin Integrity: Goal: Risk for impaired skin integrity will decrease Outcome: Progressing   Problem: Education: Goal: Ability to describe self-care measures that may prevent or decrease complications (Diabetes Survival Skills Education) will improve Outcome: Progressing   Problem: Coping: Goal: Ability to adjust to condition or change in health will improve Outcome: Progressing   Problem: Fluid Volume: Goal: Ability to maintain a balanced intake and output will improve Outcome: Progressing   Problem: Health Behavior/Discharge Planning: Goal: Ability to identify and utilize available resources and services will improve Outcome: Progressing Goal: Ability to manage health-related needs will improve Outcome: Progressing   Problem: Metabolic: Goal: Ability to maintain appropriate glucose levels will improve Outcome: Progressing   Problem: Nutritional: Goal: Maintenance of adequate nutrition will improve Outcome: Progressing Goal: Progress toward achieving an optimal weight will improve Outcome: Progressing   Problem: Skin Integrity: Goal: Risk for impaired skin integrity will decrease Outcome: Progressing   Problem: Tissue Perfusion: Goal: Adequacy of tissue perfusion will improve Outcome: Progressing

## 2021-12-13 NOTE — TOC Transition Note (Signed)
Transition of Care [facility]) - CM/SW Discharge Note   Patient Details  Name: Gary [name] MRN: [id number] Date of Birth: 25-Jan-1956  Transition [facility]) CM/SW Contact:  Beverly Sessions, RN Phone Number: 12/13/2021, 11:24 AM   Clinical Narrative:     Patient to discharge today Patient and [name] (girlfriend) in agreement Suction currently being delivered to home.  TOC spoke with driver and he is there setting it up [name] is in the home and ready to accept patient when EMS arrives  Confirmed with nursing that all required trach supplies are at bedside to transport with patient Rob with [facility] notified of discharge.  Depending on when patient arrives at home patient will either be open by nursing today or tomorrow.  Rob to communicate with [name].  I have updated patient  Patient is aware that he will not discharge with NIV  Patient has PCP follow up scheduled for Sept 2 [name] confirms all other require DME is in the home and ready for the patient (Bed, O2, WC, BSC, RW).  His trapeze bar is to be switched out for a hoyer lift.   EMS transport has been called  Patient is 6th on the list  EMS packet on chart    Barriers to Discharge: Continued Medical Work up   Patient Goals and CMS Choice Patient states their goals for this hospitalization and ongoing recovery are:: Family would like for patient to get [facility] Medicare.gov Compare Post Acute Care list provided to:: Patient Represenative (must comment) Choice offered to / list presented to : Sibling  Discharge Placement                       Discharge Plan and Services   Discharge Planning Services: CM Consult Post Acute Care Choice: Long Term Acute Care (LTAC)          DME Arranged: N/A DME Agency: NA       HH Arranged: NA HH Agency: NA        Social Determinants of Health (SDOH) Interventions     Readmission Risk Interventions    05/04/2021    1:26 PM 04/07/2021    1:39 PM 03/12/2021   11:54 AM   Readmission Risk Prevention Plan  Transportation Screening Complete Complete Complete  PCP or Specialist Appt within 3-5 Days  Complete Complete  HRI or [place]   Complete  Social Work Consult for [place] Planning/Counseling   Complete  Palliative Care Screening  Not Applicable Not Applicable  Medication Review Press photographer) Complete Complete Complete  PCP or Specialist appointment within 3-5 days of discharge Complete    HRI or [place] Complete    SW Recovery Care/Counseling Consult Complete    [place] Not Applicable

## 2021-12-13 NOTE — Discharge Summary (Signed)
Physician Discharge Summary  Gary [name] XBJ:[id number] DOB: 03/21/1956 DOA: 09/19/2021  PCP: [facility] [place] date: 09/19/2021 Discharge date: 12/13/2021  Admitted From: Home Disposition:  Home  Recommendations for Outpatient Follow-up:  Follow up with PCP in 1-2 weeks Please obtain BMP/CBC in one week your next doctors visit.  Multiple medications have been changed for the patient Start= Eliquis 2.5 mg twice daily, vitamin C supplement, Dulcolax suppository as needed, Klonopin as needed, Lexapro 5 mg daily, gabapentin 400 mg 3 times daily, hydroxyzine as needed, melatonin at bedtime, Seroquel as needed for agitation, MiraLAX Discontinued= aspirin, Coreg, Farxiga, calcitriol and vitamin D Above listed medication will be adjusted by outpatient provider as appropriate.  Discharge Condition: Stable CODE STATUS: Full code Diet recommendation: Regular  Brief/Interim Summary: 66 year old admitted to the hospital for shortness of breath and acute respiratory distress back in June requiring intubation.  He also had worsening of renal function and eventually had to be started on hemodialysis per nephrology team.  Tracheostomy was also placed by ENT on 6/20 due to of failed SBT.  Due to increasing oxygen requirement tracheal cultures were checked which showed Pseudomonas and was treated with meropenem.  PEG tube was also placed by IR on 6/30.  Off-and-on during hospitalization patient has had behavioral issues with the staff.  Earlier this month had multiple episodes of melena, nuclear scan was positive for GI bleeding and vascular was consulted.  He had undergone embolization by vascular and flex sig by GI.  Received PRBC transfusion as well. As he was tolerating feeds, IR removed PEG tube on 8/14.  Currently off and on on trach collar during the day and vent at night.  I was informed all the home arrangements have been made by [facility] team.  Overall patient is doing very well today  on the day of discharge 66 complaints and is eager to go home.  For further details regarding his hospital stay please refer to the chart.   For detail of events: As per NP Dewaine Conger: 6/6: Presented to ED.  Required intubation and mechanical ventilation in the ED.  PCCM asked to admit ICU 6/7: Hold diuresis today due to worsening Creatinine.  ENT consulted for Trach placement per family request 06/8: Pt continues to spike temps tmax 101.8 degrees F.  Flexiseal placed overnight draining foul smelling watery stool~Cdiff and GI panel pending  6/9: Fever resolved.  Negative for C- diff, GI panel still pending.  Creatinine slightly worsened, requiring low dose levophed (2 mcg). Holding diuresis 6/10: propofol discontinued due hypertriglyceridemia; failed SBT 6/11: Tracheal aspirate from 6/6 resulting with Pseudomonas, ABX changed to Cefepime. neurologically intact, awaiting Trach placement 6/12: Pt calmer today following addition of oral benzo's, narcotics, and Seroquel via tube.  Creatinine worsened with decreased UOP, holding diuresis, low threshold for Nephrology consult  6/13: Pt with worsening renal failure creatinine 4.15.  UOP overnight 25 ml.  Nephrology consulted plans to start pt on hemodialysis follow dialysis catheter placement  06/14: Pt underwent hemodialysis yesterday without ultrafiltration.  Plans for HD session today.  Agitation has improved significantly with narcotics and antianxiety medications per tube   06/16: Tracheostomy canceled 06/15 due to hyperkalemia and tentatively rescheduled for 06/20  6/18 failure to wean from vent 06/20: ENT placed Tracheostomy (#7.0 XLT cuffed).  Developed mild epistaxis post unsuccessful attempt at NGT placement.  Later developed hematochezia/melena, Protonix gtt started and GI consulted. 06/21: Diuresed yesterday with limited results. Patient had softed blood pressures throughout the day and  diuresis was held as a result. HD was performed by the patient  clotted off the machine and was not able to be rinsed back. Repeat hemoglobin is above 8. 06/23: Pt remains mechanically intubated settings: FiO2 50%/PEEP 10.  CXR concerning for moderate right pleural effusion with associated right basilar atelectasis vs. infiltrate.  Will perform [place] Chest to determine if pt needs right sided thoracentesis  06/24: Remains on the ventilator, FiO2 requirements decreasing, respiratory culture with abundant gram-negative rods: [name], on Meropenem 06/25: Oxygen requirements down to 40%, chest x-ray shows improvement on dense right consolidation, Pseudomonas susceptibilities pending 06/25: Pt remains on the ventilator settings: PEEP 10 and FiO2 40%; will attempt to wean PEEP to 5 to perform SBT  06/28: Pt tolerated PS on 06/27.  Overnight pt had episodes of vomiting TF's suspect secondary to malposition of nasogastric tube.  Will consult IR for PEG tube placement if unable to place PEG tube pt will need postpyloric Dobbhoff   06/29: Pt tolerated PS 5/5 for over an hour yesterday, however due to fatigue placed back on PRVC.  Pt pulled NGT out several times overnight, therefore will not attempt to reinsert.  Plans for PEG tube placement per IR on 06/30.   06/30: PEG tube placed per IR  07/1: Pt tolerated trach collar '@40'$ % throughout the day 07/2: Pt placed on ventilator overnight settings 16/5/35% tolerating well.  Will place pt on trach collar today and place orders for PT/OT 7/3: Hemodynamically stable.  Tolerating TC (has remained on TC since 7/2 @ 08:00), plan for speech therapy and PT/OT 7/4: Has maintained on trach collar for greater than 48 hours, tolerating Passy-Muir valve.  Plan to transfer out of ICU today. 7/5-7/6: sodium and creatinine trending appropriately, Hgb stable. Low K repleted and stable. Was off vent qhs (orders d/c or missed?) and was restarted. Will need LTAC, TOC following  7/7: pulled out trach overnight. Confusion re: vent plan  among staff and myself. I asked PCCM to reevluate him and verify plan so the team and the patient can eb on the same page, and will help TOC arrange appropriate placement.  7/8: I spoke personally w/ Dr Milon Dikes [facility]) and he confirms patient does NOT need ventilator support, unless of course develops distress, but last night the patient tolerated well without it. Plan to meet w/ family today when they visit, I spoke w/ his sister yesterday on the phone. No concerns on CBC/BMP: Cr tending down a bit, Hgb stable, Na slightly high but improving over past few days. He is approaching medically stable and may be appropriate for LTAC vs SNF soon  7/9: Several long discussions with patient and family about medical condition, behavioral issues.  See separate progress notes.  Psychiatry evaluated, agree that he has capacity, started SSRI. 7/10: Patient remains medically stable, awaiting placement.  Discussed his behavioral issues again and encouraged him to always bring any concerns to nursing staff or to me calmly and I am happy to listen, requested he maintain professionalism towards staff, he seems amenable. 7/11-7/14: goal OOB to chair, needing hoyer lift, pt reeducated several times about staying in bed 07/11 and has been compliant through 07/14. Finally got bariatric chair and hoyer lift 7/12 and was OOB 07/13.  D/c monitoring and sitter and transferred to [place] status 07/13. Awaiting placement.  7/15-17- working to reorient patient, but personality and coherence remain difficult. Stable for placement. Reminded patient not to verbally abuse staff 7/28 - rising O2 needs past couple of days,  due to mucus plugging.  ABG stable, normal mentation.  Improved after suctioning. 8/2: Patient awaiting placement in a long-term acute care setting.. 8/3: Multiple episodes of melena.  Transfer to [facility] for closer monitoring due to high risk of decompensation.  GI Dr. Lynnell Jude states given no prep patient cannot have a  colonoscopy.  Nuclear scan positive for active GI bleeding in the proximal sigmoid colon, vascular surgery Dr. Delana Meyer consulted, plan for IR embolization  8/4: patietn had large bloody BM this morning post ablation yesterday. He is improved clinically though and mentation is better.  S/p GI procedure with continued blood loss near sigmoid.  8/5-8/8: Patient remains hemodynamically stable, Patient reports very little blood in the stools.  1 unit PRBC given, Hb 7.9. 8/9: Patient remained stable, no more bleeding per rectum.  Received total of 3 unit of PRBC.  Hemoglobin at 7.6, anemia panel with concern of iron deficiency.  Iron replacement ordered. Patient remained stable.  He was asking if he can go home in the next week or so with home health services.  Per patient he is trying his best to work with PT, they recommend patient remained for SNF. 8/11: Patient remained stable.  Started working with PT.  He was asking to remove G-tube as he was eating and drinking okay and has not used that tube for a while-message sent to GI.  He was hoping to be able to go home. 8/12: IR placed the PEG tube and waiting to complete 6 weeks which will be done in next few days before removing. 8/14: PEG tube was removed by IR today.  Please see postprocedure instructions. 8/16: Transfer back to ICU for somnolence, hypercarbic resp failure requiring vent 8/18: tolerating TC during day & vent nightly. Creatinine increases, hold diuresis today 8/19: Pt refused to be placed on ventilator overnight.  Tolerated trach collar  8/20: Pt placed on ventilator overnight.  Transitioned to trach collar this morning 8/21: Refused ventilator overnight.  GOC of conversations regarding importance of ventilator, pt says he is willing to trial Trilogy at home  8/22: Declined ventilator overnight, otherwise stable with no acute changes   As per Dr. Jimmye Norman 8/23-8/29/23: Pt has remained relatively  stable this week. CM is working on d/c plan  for 8/30 in which home health with start, all equipment has been delivered to pt's house except for suction & hoyer lift but CM is trying to confirm w/ [employer] that these 2 items were delivered today (see CM's note). Pt's family came by the hospital 12/08/21 to learn trach care and how to take care of pt. For more information, please previous progress/consult notes.      Discharge Diagnoses:  Principal Problem:   Acute on chronic respiratory failure with hypoxia and hypercapnia ([name]) Active Problems:   Major depressive disorder, recurrent episode, moderate ([name])   Bloody stool   COPD with acute exacerbation ([name])   Acute metabolic encephalopathy   HTN (hypertension)   Acute on chronic diastolic congestive heart failure ([name])   Obesity, Class III, BMI 40-49.9 (morbid obesity) ([place])   Obstructive sleep apnea   Chronic kidney disease, stage 3a ([place])   HLD (hyperlipidemia)   Pressure injury of skin   Hematochezia      Consultations: Critical care General surgery Psychiatry Podiatry Palliative care Gastroenterology Interventional radiology Vascular surgery  Subjective: Doing well does not have any complaints today.  Discharge Exam: Vitals:   12/13/21 0844 12/13/21 1153  BP: 122/79 115/77  Pulse: 80 86  Resp: 20 16  Temp: 98.9 F (37.2 C) 98.9 F (37.2 C)  SpO2: 93% 90%   Vitals:   12/13/21 0733 12/13/21 0844 12/13/21 0844 12/13/21 1153  BP:  122/79 122/79 115/77  Pulse: 80 80 80 86  Resp: '16 20 20 16  '$ Temp:  98.9 F (37.2 C) 98.9 F (37.2 C) 98.9 F (37.2 C)  TempSrc:  Oral Oral Oral  SpO2: 90%  93% 90%  Weight:      Height:        General: Pt is alert, awake, not in acute distress Cardiovascular: RRR, S1/S2 +, no rubs, no gallops Respiratory: CTA bilaterally, no wheezing, no rhonchi Abdominal: Soft, NT, ND, bowel sounds + Extremities: no edema, no cyanosis  Tracheostomy in place, c-collar.  Discharge Instructions   Allergies as of 12/13/2021   No  Known Allergies      Medication List     STOP taking these medications    aspirin EC 81 MG tablet   calcitRIOL 0.25 MCG capsule Commonly known as: ROCALTROL   carvedilol 3.125 MG tablet Commonly known as: COREG   dapagliflozin propanediol 10 MG Tabs tablet Commonly known as: Farxiga   Vitamin D (Ergocalciferol) 1.25 MG (50000 UNIT) Caps capsule Commonly known as: DRISDOL       TAKE these medications    Advair HFA 230-21 MCG/ACT inhaler Generic drug: fluticasone-salmeterol Inhale 2 puffs into the lungs 2 (two) times daily.   apixaban 2.5 MG Tabs tablet Commonly known as: ELIQUIS Take 1 tablet (2.5 mg total) by mouth 2 (two) times daily.   ascorbic acid 500 MG tablet Commonly known as: VITAMIN C Take 1 tablet (500 mg total) by mouth 2 (two) times daily.   bisacodyl 10 MG suppository Commonly known as: DULCOLAX Place 1 suppository (10 mg total) rectally daily as needed for moderate constipation.   budesonide 0.25 MG/2ML nebulizer solution Commonly known as: PULMICORT Take 2 mLs (0.25 mg total) by nebulization 2 (two) times daily.   clonazePAM 0.5 MG disintegrating tablet Commonly known as: KLONOPIN Take 1 tablet (0.5 mg total) by mouth 2 (two) times daily as needed (Anxiety / agitation).   escitalopram 5 MG tablet Commonly known as: LEXAPRO Take 1 tablet (5 mg total) by mouth daily. Start taking on: December 14, 2021   ferrous sulfate 325 (65 FE) MG tablet Take 1 tablet (325 mg total) by mouth 2 (two) times daily with a meal.   gabapentin 400 MG capsule Commonly known as: NEURONTIN Take 1 capsule (400 mg total) by mouth every 8 (eight) hours.   hydrOXYzine 25 MG tablet Commonly known as: ATARAX Take 1 tablet (25 mg total) by mouth 3 (three) times daily as needed for anxiety.   ipratropium-albuterol 0.5-2.5 (3) MG/3ML Soln Commonly known as: DUONEB Take 3 mLs by nebulization every 6 (six) hours. What changed:  when to take this reasons to take this    Combivent Respimat 20-100 MCG/ACT Aers respimat Generic drug: Ipratropium-Albuterol Inhale 2 puffs into the lungs in the morning and at bedtime. What changed: Another medication with the same name was changed. Make sure you understand how and when to take each.   melatonin 5 MG Tabs Take 1 tablet (5 mg total) by mouth at bedtime.   pantoprazole 40 MG tablet Commonly known as: PROTONIX Take 1 tablet (40 mg total) by mouth 2 (two) times daily before a meal. What changed: when to take this   polyethylene glycol 17 g packet Commonly known as: MIRALAX / GLYCOLAX Take  17 g by mouth daily as needed for moderate constipation.   potassium chloride 10 MEQ tablet Commonly known as: KLOR-CON M Take 1 tablet (10 mEq total) by mouth daily.   QUEtiapine 25 MG tablet Commonly known as: SEROQUEL Take 1 tablet (25 mg total) by mouth 2 (two) times daily as needed (agitation).   simvastatin 10 MG tablet Commonly known as: ZOCOR Take 1 tablet (10 mg total) by mouth daily at 6 PM.   torsemide 20 MG tablet Commonly known as: DEMADEX Take 1 tablet (20 mg total) by mouth daily.               Durable Medical Equipment  (From admission, onward)           Start     Ordered   12/11/21 1035  For home use only DME Other see comment  Once       Comments: Harrel Lemon lift  Question:  Length of Need  Answer:  Lifetime   12/11/21 1035   12/08/21 1223  For home use only DME Other see comment  Once       Comments: Bariatric recliner  Question:  Length of Need  Answer:  Lifetime   12/08/21 1222   12/08/21 1223  For home use only DME Suction  Once       Question:  Suction  Answer:  Trach   12/08/21 1222   12/08/21 1222  For home use only DME Walker rolling  Once       Comments: Bariatric walker  Question Answer Comment  Walker: With [employer] Wheels   Patient needs a walker to treat with the following condition Generalized weakness      12/08/21 1222   12/08/21 1221  For home use only DME Other  see comment  Once       Comments: Harrel Lemon life  Question:  Length of Need  Answer:  Lifetime   12/08/21 1222            Follow-up Information     Ranae Plumber, PA Follow up on 12/19/2021.   Specialty: Family Medicine Why: 5:20 pm Contact information: [place] [address] [place], [place] Follow up in 1 week(s).   Specialty: General Practice Contact information: [place] [address] [phone]         [name], MD .   Specialty: Cardiology Contact information: [place] [address] [phone]                No Known Allergies  You were cared for by a hospitalist during your hospital stay. If you have any questions about your discharge medications or the care you received while you were in the hospital after you are discharged, you can call the unit and asked to speak with the hospitalist on call if the hospitalist that took care of you is not available. Once you are discharged, your primary care physician will handle any further medical issues. Please note that no refills for any discharge medications will be authorized once you are discharged, as it is imperative that you return to your primary care physician (or establish a relationship with a primary care physician if you do not have one) for your aftercare needs so that they can reassess your need for medications and monitor your lab values.   Procedures/Studies: [employer] Chest Port 1 View  Result  Date: 11/29/2021 CLINICAL DATA:  Respiratory failure. EXAM: PORTABLE CHEST 1 VIEW COMPARISON:  November 10, 2021 FINDINGS: There is stable tracheostomy tube positioning. The cardiac silhouette is mildly enlarged and unchanged in size. Mild prominence of the perihilar pulmonary vasculature is seen. Low lung volumes are noted without evidence of an acute infiltrate, pleural effusion or pneumothorax. The  visualized skeletal structures are unremarkable. IMPRESSION: 1. Stable cardiomegaly and mild pulmonary vascular congestion. Electronically Signed   By: Virgina Norfolk M.D.   On: 11/29/2021 23:24   IR GASTROSTOMY TUBE REMOVAL/REPAIR  Result Date: 11/27/2021 INDICATION: 66 year old male with history of image guided percutaneous gastrostomy tube placed on 11/12/2021. Request for removal. EXAM: BEDSIDE REMOVAL OF GASTROSTOMY TUBE COMPARISON:  10/13/2021 MEDICATIONS: None. CONTRAST:  None FLUOROSCOPY TIME:  None COMPLICATIONS: None immediate. PROCEDURE: A time-out was performed prior to the initiation of the procedure. The track of the existing gastrostomy tube was lubricated with viscous lidocaine. The balloon was deflated. The gastrostomy tube was then removed intact. A dressing was placed. The patient tolerated the procedure well without immediate postprocedural complication. IMPRESSION: Successful bedside removal of indwelling gastrostomy tube without complication Ruthann Cancer, MD [facility] Radiology Electronically Signed   By: Ruthann Cancer M.D.   On: 11/27/2021 13:58   PERIPHERAL VASCULAR CATHETERIZATION  Result Date: 11/16/2021 See surgical note for result.  CT Angio Abd/Pel w/ and/or w/o  Result Date: 11/16/2021 CLINICAL DATA:  Bloody stools.  Evaluate for GI bleed. EXAM: CTA ABDOMEN AND PELVIS WITHOUT AND WITH CONTRAST TECHNIQUE: Multidetector CT imaging of the abdomen and pelvis was performed using the standard protocol during bolus administration of intravenous contrast. Multiplanar reconstructed images and MIPs were obtained and reviewed to evaluate the vascular anatomy. RADIATION DOSE REDUCTION: This exam was performed according to the departmental dose-optimization program which includes automated exposure control, adjustment of the mA and/or kV according to patient size and/or use of iterative reconstruction technique. CONTRAST:  131m  OMNIPAQUE IOHEXOL 350 MG/ML SOLN COMPARISON:  Nuclear medicine bleeding exam 11/16/2021. 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 The abdominal aorta measures up to 3.3 cm at this level and the saccular component roughly measures 2.2 x 2.3 x 2.6 cm. This saccular aneurysm appears to have enlarged since 2015 but was poorly characterized on the previous examination. Saccular aneurysm is located above the inferior mesenteric artery. There is mild irregularity in the abdominal aorta distal to the IMA. Celiac: Common origin for the celiac trunk and SMA. Celiac trunk origin is widely patent. Main branch vessels are patent. SMA: Patent without evidence of aneurysm, dissection, vasculitis or significant stenosis. Renals: Both renal arteries are patent without evidence of aneurysm, dissection, vasculitis, fibromuscular dysplasia or significant stenosis. IMA: Patent without evidence of aneurysm, dissection, vasculitis or significant stenosis. Inflow: Patent without evidence of aneurysm, dissection, vasculitis or significant stenosis. Proximal Outflow: Proximal femoral arteries are patent bilaterally. Veins: No gross abnormality to the IVC or iliac veins. Main portal venous system appears to be patent with significant streak artifact in the abdomen. Review of the MIP images confirms the above findings. NON-VASCULAR Lower chest: Few densities at the posterior left lung base are suggestive for atelectasis or scarring. No large pleural effusions. Hepatobiliary: Streak artifact in the abdomen limits evaluation. No gross abnormality to the liver or gallbladder. Pancreas: Unremarkable. No pancreatic ductal dilatation or surrounding inflammatory changes. Spleen: Normal in size. Limited evaluation due to streak  artifact in the abdomen. Adrenals/Urinary Tract: 1.8 cm low-density nodule in the right adrenal gland is  suggestive for a benign adenoma based on the Hounsfield units on the precontrast images. Normal appearance of left adrenal gland. Negative for kidney stones or hydronephrosis. Probable cyst in left kidney upper pole but poorly characterized due to streak artifact. Fluid in the urinary bladder without gross abnormality. Stomach/Bowel: Limited evaluation for GI bleeding on this examination due to extensive streak artifact throughout the abdomen and pelvis. An area of active GI bleeding is not confidently identified. Patient has probably had a right hemicolectomy. Moderate amount of stool in the colon. There is redundancy and diverticula involving the sigmoid colon. Difficult to exclude inflammation in the sigmoid colon. There is a balloon retention gastrostomy tube in the distal stomach. No significant small bowel distension. Lymphatic: Prominent lymph node adjacent to the sigmoid colon on sequence 6 image 68 measures 1.1 cm. Slightly prominent lymph node anterior to the distal aorta on sequence 6, image 56 measures 1.1 cm in the short axis. No other significant lymph node enlargement in the abdomen or pelvis. Reproductive: Limited evaluation of the prostate. Other: There is no evidence for free fluid. Negative for free air. Periumbilical ventral hernia containing fat. Multiple surgical clips in the right abdomen compatible with previous bowel surgery Musculoskeletal: No acute bone abnormality. IMPRESSION: VASCULAR 1. 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 Abdominal aorta at this level measures up to 3.3 cm. Saccular component measures up to 2.6 cm. Recommend vascular consultation. Reference: J Vasc Surg 5643;32:9-51. 2. Main visceral arteries are patent. Common trunk for the celiac trunk and SMA which is a normal variant. NON-VASCULAR 1. Limited evaluation for GI bleeding due to extensive streak artifact in the abdomen and pelvis. No clear evidence for active GI bleeding on this examination. 2.  Redundancy in the sigmoid colon with diverticula. Difficult to exclude inflammatory changes and wall thickening involving the sigmoid colon. Patient would likely benefit from follow-up imaging or colonoscopy in this area. 3. Slightly enlarged lymph nodes in the abdomen are indeterminate. Lymph nodes may be associated with the sigmoid colon and could be reactive. 4. Gastrostomy tube in place. Electronically Signed   By: Markus Daft M.D.   On: 11/16/2021 17:43   NM GI Blood Loss  Result Date: 11/16/2021 CLINICAL DATA:  Bright red blood per rectum. EXAM: NUCLEAR MEDICINE GASTROINTESTINAL BLEEDING SCAN TECHNIQUE: Sequential abdominal images were obtained following intravenous administration of Tc-21mlabeled red blood cells. RADIOPHARMACEUTICALS:  20.7 mCi Tc-98mertechnetate in-vitro labeled red cells. COMPARISON:  CT 12/13/2021 FINDINGS: By 5 minutes of imaging, abnormal tagged red blood cells accumulation is noted in the LEFT lower quadrant. Tagged red blood cell accumulates over the next 50 minutes and remain centrally centered in the LEFT lower quadrant. The pattern of activity conforms to the expected location and shape of the proximal sigmoid colon. IMPRESSION: Active gastrointestinal bleeding into the proximal sigmoid colon. These results will be called to the ordering clinician or representative by the Radiologist Assistant, and communication documented in the PACS or [employer] CorporationElectronically Signed   By: StSuzy Bouchard.D.   On: 11/16/2021 14:49     The results of significant diagnostics from this hospitalization (including imaging, microbiology, ancillary and laboratory) are listed below for reference.     Microbiology: No results found for this or any previous visit (from the past 240 hour(s)).   Labs: BNP (last 3 results) Recent Labs    08/15/21 0440 09/19/21 0957 11/10/21 1023  BNP 11.4 268.4* 75.9   Basic Metabolic Panel: Recent Labs  Lab 12/10/21 0631 12/11/21 0452  12/12/21 0457 12/13/21 0540 12/13/21 1019  NA 143 142 141 140 141  K 4.7 4.8 4.4 6.2* 4.6  CL 98 95* 97* 97* 95*  CO2 40* 37* 39* 36* 39*  GLUCOSE 104* 93 123* 87 117*  BUN 28* 29* 27* 28* 31*  CREATININE 1.17 1.18 1.28* 1.22 1.28*  CALCIUM 8.4* 8.7* 8.4* 8.4* 8.6*   Liver Function Tests: No results for input(s): "AST", "ALT", "ALKPHOS", "BILITOT", "PROT", "ALBUMIN" in the last 168 hours. No results for input(s): "LIPASE", "AMYLASE" in the last 168 hours. No results for input(s): "AMMONIA" in the last 168 hours. CBC: Recent Labs  Lab 12/10/21 0631 12/11/21 0452 12/12/21 0457 12/13/21 0540  WBC 6.9 6.7 7.0 7.7  HGB 8.6* 8.8* 8.7* 9.0*  HCT 33.3* 34.6* 33.7* 34.7*  MCV 87.4 88.3 87.3 86.3  PLT 147* 160 241 212   Cardiac Enzymes: No results for input(s): "CKTOTAL", "CKMB", "CKMBINDEX", "TROPONINI" in the last 168 hours. BNP: Invalid input(s): "POCBNP" CBG: Recent Labs  Lab 12/09/21 1201  GLUCAP 123*   D-Dimer No results for input(s): "DDIMER" in the last 72 hours. Hgb A1c No results for input(s): "HGBA1C" in the last 72 hours. Lipid Profile No results for input(s): "CHOL", "HDL", "LDLCALC", "TRIG", "CHOLHDL", "LDLDIRECT" in the last 72 hours. Thyroid function studies No results for input(s): "TSH", "T4TOTAL", "T3FREE", "THYROIDAB" in the last 72 hours.  Invalid input(s): "FREET3" Anemia work up No results for input(s): "VITAMINB12", "FOLATE", "FERRITIN", "TIBC", "IRON", "RETICCTPCT" in the last 72 hours. Urinalysis    Component Value Date/Time   COLORURINE STRAW (A) 09/19/2021 1247   APPEARANCEUR CLEAR (A) 09/19/2021 1247   APPEARANCEUR Clear 04/15/2014 1240   LABSPEC 1.009 09/19/2021 1247   LABSPEC 1.013 04/15/2014 1240   PHURINE 5.0 09/19/2021 1247   GLUCOSEU 150 (A) 09/19/2021 1247   GLUCOSEU Negative 04/15/2014 1240   HGBUR NEGATIVE 09/19/2021 1247   BILIRUBINUR NEGATIVE 09/19/2021 1247   BILIRUBINUR Negative 04/15/2014 1240   KETONESUR NEGATIVE  09/19/2021 1247   PROTEINUR NEGATIVE 09/19/2021 1247   NITRITE NEGATIVE 09/19/2021 1247   LEUKOCYTESUR NEGATIVE 09/19/2021 1247   LEUKOCYTESUR Negative 04/15/2014 1240   Sepsis Labs Recent Labs  Lab 12/10/21 0631 12/11/21 0452 12/12/21 0457 12/13/21 0540  WBC 6.9 6.7 7.0 7.7   Microbiology No results found for this or any previous visit (from the past 240 hour(s)).   Time coordinating discharge:  I have spent 35 minutes face to face with the patient and on the ward discussing the patients care, assessment, plan and disposition with other care givers. >50% of the time was devoted counseling the patient about the risks and benefits of treatment/Discharge disposition and coordinating care.   SIGNED:   Damita Lack, MD  [facility] 12/13/2021, 1:14 PM   If 7PM-7AM, please contact night-coverage

## 2021-12-14 ENCOUNTER — Other Ambulatory Visit: Payer: Self-pay | Admitting: Family

## 2021-12-14 ENCOUNTER — Telehealth (HOSPITAL_COMMUNITY): Payer: Self-pay

## 2021-12-14 DIAGNOSIS — I5032 Chronic diastolic (congestive) heart failure: Secondary | ICD-10-CM
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
## 2021-12-18 ENCOUNTER — Other Ambulatory Visit: Payer: Self-pay

## 2021-12-18 ENCOUNTER — Encounter: Payer: Self-pay | Admitting: Emergency Medicine

## 2021-12-18 ENCOUNTER — Emergency Department: Payer: Medicaid Other

## 2021-12-18 ENCOUNTER — Inpatient Hospital Stay
Admission: EM | Admit: 2021-12-18 | Discharge: 2021-12-26 | DRG: 208 | Disposition: A | Discharge status: Home Health | Payer: Medicaid Other | Attending: Internal Medicine | Admitting: Internal Medicine

## 2021-12-18 DIAGNOSIS — J449 Chronic obstructive pulmonary disease, unspecified: Secondary | ICD-10-CM | POA: Diagnosis present

## 2021-12-18 DIAGNOSIS — J9602 Acute respiratory failure with hypercapnia: Principal | ICD-10-CM | POA: Diagnosis present

## 2021-12-18 DIAGNOSIS — Z9981 Dependence on supplemental oxygen: Secondary | ICD-10-CM

## 2021-12-18 DIAGNOSIS — G629 Polyneuropathy, unspecified: Secondary | ICD-10-CM | POA: Diagnosis present

## 2021-12-18 DIAGNOSIS — E8729 Other acidosis: Secondary | ICD-10-CM | POA: Diagnosis present

## 2021-12-18 DIAGNOSIS — Z91199 Patient's noncompliance with other medical treatment and regimen due to unspecified reason: Secondary | ICD-10-CM

## 2021-12-18 DIAGNOSIS — Z20822 Contact with and (suspected) exposure to covid-19: Secondary | ICD-10-CM | POA: Diagnosis present

## 2021-12-18 DIAGNOSIS — G928 Other toxic encephalopathy: Secondary | ICD-10-CM | POA: Diagnosis present

## 2021-12-18 DIAGNOSIS — Z79899 Other long term (current) drug therapy: Secondary | ICD-10-CM

## 2021-12-18 DIAGNOSIS — E785 Hyperlipidemia, unspecified: Secondary | ICD-10-CM | POA: Diagnosis present

## 2021-12-18 DIAGNOSIS — G4730 Sleep apnea, unspecified: Secondary | ICD-10-CM | POA: Diagnosis present

## 2021-12-18 DIAGNOSIS — Z7951 Long term (current) use of inhaled steroids: Secondary | ICD-10-CM

## 2021-12-18 DIAGNOSIS — E875 Hyperkalemia: Secondary | ICD-10-CM

## 2021-12-18 DIAGNOSIS — J9622 Acute and chronic respiratory failure with hypercapnia: Principal | ICD-10-CM | POA: Diagnosis present

## 2021-12-18 DIAGNOSIS — E66813 Obesity, class 3: Secondary | ICD-10-CM

## 2021-12-18 DIAGNOSIS — Z8616 Personal history of COVID-19: Secondary | ICD-10-CM

## 2021-12-18 DIAGNOSIS — N1831 Chronic kidney disease, stage 3a: Secondary | ICD-10-CM | POA: Diagnosis present

## 2021-12-18 DIAGNOSIS — F1910 Other psychoactive substance abuse, uncomplicated: Secondary | ICD-10-CM | POA: Diagnosis present

## 2021-12-18 DIAGNOSIS — E662 Morbid (severe) obesity with alveolar hypoventilation: Secondary | ICD-10-CM | POA: Diagnosis present

## 2021-12-18 DIAGNOSIS — J9621 Acute and chronic respiratory failure with hypoxia: Secondary | ICD-10-CM | POA: Diagnosis present

## 2021-12-18 DIAGNOSIS — I10 Essential (primary) hypertension: Secondary | ICD-10-CM | POA: Diagnosis present

## 2021-12-18 DIAGNOSIS — J441 Chronic obstructive pulmonary disease with (acute) exacerbation: Secondary | ICD-10-CM | POA: Diagnosis present

## 2021-12-18 DIAGNOSIS — I130 Hypertensive heart and chronic kidney disease with heart failure and stage 1 through stage 4 chronic kidney disease, or unspecified chronic kidney disease: Secondary | ICD-10-CM | POA: Diagnosis present

## 2021-12-18 DIAGNOSIS — Z930 Tracheostomy status: Secondary | ICD-10-CM

## 2021-12-18 DIAGNOSIS — J9601 Acute respiratory failure with hypoxia: Secondary | ICD-10-CM | POA: Diagnosis present

## 2021-12-18 DIAGNOSIS — I959 Hypotension, unspecified: Secondary | ICD-10-CM | POA: Diagnosis present

## 2021-12-18 DIAGNOSIS — Z6841 Body Mass Index (BMI) 40.0 and over, adult: Secondary | ICD-10-CM

## 2021-12-18 DIAGNOSIS — Z9049 Acquired absence of other specified parts of digestive tract: Secondary | ICD-10-CM

## 2021-12-18 DIAGNOSIS — G4733 Obstructive sleep apnea (adult) (pediatric): Secondary | ICD-10-CM | POA: Diagnosis present

## 2021-12-18 DIAGNOSIS — Z87891 Personal history of nicotine dependence: Secondary | ICD-10-CM

## 2021-12-18 DIAGNOSIS — J9600 Acute respiratory failure, unspecified whether with hypoxia or hypercapnia: Secondary | ICD-10-CM

## 2021-12-18 DIAGNOSIS — J9620 Acute and chronic respiratory failure, unspecified whether with hypoxia or hypercapnia: Secondary | ICD-10-CM | POA: Diagnosis present

## 2021-12-18 DIAGNOSIS — I5033 Acute on chronic diastolic (congestive) heart failure: Secondary | ICD-10-CM | POA: Diagnosis present

## 2021-12-18 DIAGNOSIS — Z7901 Long term (current) use of anticoagulants: Secondary | ICD-10-CM

## 2021-12-18 DIAGNOSIS — F331 Major depressive disorder, recurrent, moderate: Secondary | ICD-10-CM | POA: Diagnosis present

## 2021-12-18 LAB — TROPONIN I (HIGH SENSITIVITY): Troponin I (High Sensitivity): 7 ng/L (ref <18)

## 2021-12-18 LAB — CBC WITH DIFFERENTIAL/PLATELET
Abs Immature Granulocytes: 0.05 10*3/uL (ref 0.00–0.07)
Basophils Absolute: 0 10*3/uL (ref 0.0–0.1)
Basophils Relative: 1 %
Eosinophils Absolute: 0.2 10*3/uL (ref 0.0–0.5)
Eosinophils Relative: 2 %
HCT: 40.2 % (ref 39.0–52.0)
Hemoglobin: 9.9 g/dL — ABNORMAL LOW (ref 13.0–17.0)
Immature Granulocytes: 1 %
Lymphocytes Relative: 22 %
Lymphs Abs: 1.6 10*3/uL (ref 0.7–4.0)
MCH: 21.8 pg — ABNORMAL LOW (ref 26.0–34.0)
MCHC: 24.6 g/dL — ABNORMAL LOW (ref 30.0–36.0)
MCV: 88.5 fL (ref 80.0–100.0)
Monocytes Absolute: 0.8 10*3/uL (ref 0.1–1.0)
Monocytes Relative: 11 %
Neutro Abs: 4.9 10*3/uL (ref 1.7–7.7)
Neutrophils Relative %: 63 %
Platelets: 238 10*3/uL (ref 150–400)
RBC: 4.54 MIL/uL (ref 4.22–5.81)
RDW: 18.6 % — ABNORMAL HIGH (ref 11.5–15.5)
WBC: 7.6 10*3/uL (ref 4.0–10.5)
nRBC: 0.7 % — ABNORMAL HIGH (ref 0.0–0.2)

## 2021-12-18 LAB — COMPREHENSIVE METABOLIC PANEL
ALT: 9 U/L (ref 0–44)
AST: 13 U/L — ABNORMAL LOW (ref 15–41)
Albumin: 3.1 g/dL — ABNORMAL LOW (ref 3.5–5.0)
Alkaline Phosphatase: 67 U/L (ref 38–126)
Anion gap: 7 (ref 5–15)
BUN: 23 mg/dL (ref 8–23)
CO2: 32 mmol/L (ref 22–32)
Calcium: 8.7 mg/dL — ABNORMAL LOW (ref 8.9–10.3)
Chloride: 103 mmol/L (ref 98–111)
Creatinine, Ser: 1.24 mg/dL (ref 0.61–1.24)
GFR, Estimated: >60 mL/min (ref >60)
Glucose, Bld: 111 mg/dL — ABNORMAL HIGH (ref 70–99)
Potassium: 4.3 mmol/L (ref 3.5–5.1)
Sodium: 142 mmol/L (ref 135–145)
Total Bilirubin: 0.6 mg/dL (ref 0.3–1.2)
Total Protein: 8.6 g/dL — ABNORMAL HIGH (ref 6.5–8.1)

## 2021-12-18 LAB — PROCALCITONIN: Procalcitonin: <0.1 ng/mL

## 2021-12-18 LAB — BRAIN NATRIURETIC PEPTIDE: B Natriuretic Peptide: 237 pg/mL — ABNORMAL HIGH (ref 0.0–100.0)

## 2021-12-18 LAB — LACTIC ACID, PLASMA: Lactic Acid, Venous: 1.5 mmol/L (ref 0.5–1.9)

## 2021-12-18 NOTE — ED Triage Notes (Signed)
Pt arrives via [facility] from home with CC of respiratory distress. EMS reports SpO2 of 50% upon arrival. Pt has trach and wears chronic oxygen at 7L. Breathing labored at this time.  EMS VS: 91/67 HR 110 81% on 15L blow by

## 2021-12-18 NOTE — ED Provider Notes (Signed)
----------------------------------------- 11:19 PM on 12/18/2021 -----------------------------------------  Assuming care from Dr. Jari Pigg.  In short, Gary [name] is a 66 y.o. male with a chief complaint of respiratory distress.  Refer to the original H&P for additional details.  The current plan of care is to follow-up on labs and EKG and imaging.  Patient will need reassessment to determine appropriate level of care.  Labs/studies ordered include BNP, venous blood gas, CBC with differential, comprehensive metabolic panel, high-sensitivity troponin, COVID-19 swab.  Most notably his labs indicate an essentially normal creatinine and a very slightly elevated BNP, but a VBG with a PCO2 of 94.  This is consistent with acute on chronic respiratory failure with hypoxia and hypercapnia.  He is currently on a trach collar with oxygen but not receiving positive pressure ventilation.  I viewed and interpreted his chest x-ray and it is difficult to interpret because of his body habitus but there appears to be some degree of volume overload.  I do not believe it is indicative of any infiltrates, and he has no other findings on his lab work or vital signs to suggest an infectious process.  I will hold off on empiric antibiotics.  Given his VBG and hypercapnic respiratory failure, he needs positive pressure ventilation.  I spoke with [name] the respiratory therapist and he is going to put him on the ventilator.  I spoke with the patient very frankly about the results; he is reluctant to use the ventilator but I explained that it is necessary or he will get worse and worse.  He reluctantly agreed to be put on the ventilator to help with his oxygenation and ventilation.  Than his history of COPD, I also ordered DuoNebs x3 and Solu-Medrol 125 mg IV.  I will discuss the case with the ICU.   ED ECG REPORT I, Hinda Kehr, the attending physician, personally viewed and interpreted this ECG.  Date: 12/19/2021 EKG  Time: 1:08 AM Rate: 90 Rhythm: normal sinus rhythm QRS Axis: normal Intervals: normal ST/T Wave abnormalities: normal Narrative Interpretation: no evidence of acute ischemia  .1-3 Lead EKG Interpretation  Performed by: Hinda Kehr, MD Authorized by: Hinda Kehr, MD     Interpretation: normal     ECG rate:  85   ECG rate assessment: normal     Rhythm: sinus rhythm     Ectopy: none     Conduction: normal   .Critical Care  Performed by: Hinda Kehr, MD Authorized by: Hinda Kehr, MD   Critical care provider statement:    Critical care time (minutes):  30   Critical care time was exclusive of:  Separately billable procedures and treating other patients   Critical care was necessary to treat or prevent imminent or life-threatening deterioration of the following conditions:  Respiratory failure   Critical care was time spent personally by me on the following activities:  Development of treatment plan with patient or surrogate, evaluation of patient's response to treatment, examination of patient, obtaining history from patient or surrogate, ordering and performing treatments and interventions, ordering and review of laboratory studies, ordering and review of radiographic studies, pulse oximetry, re-evaluation of patient's condition and review of old charts     Clinical Course as of 12/19/21 0141  Tue Dec 19, 2021  0111 I consulted by phone with [name], ICU NP, who is covering the ICU.  She knows the patient well.  We discussed the situation in detail and she agreed to admit to the ICU service given his  long history and the unit and need for ventilator care. [CF]    Clinical Course User Index [CF] Hinda Kehr, MD     Medications  docusate sodium (COLACE) capsule 100 mg (has no administration in time range)  polyethylene glycol (MIRALAX / GLYCOLAX) packet 17 g (has no administration in time range)  pantoprazole (PROTONIX) 2 mg/mL oral suspension 40 mg  (has no administration in time range)  acetaminophen (TYLENOL) tablet 650 mg (has no administration in time range)  Oral care mouth rinse (has no administration in time range)  Oral care mouth rinse (has no administration in time range)  enoxaparin (LOVENOX) injection 95 mg (has no administration in time range)  ipratropium-albuterol (DUONEB) 0.5-2.5 (3) MG/3ML nebulizer solution 3 mL (3 mLs Nebulization Given 12/19/21 0107)  ipratropium-albuterol (DUONEB) 0.5-2.5 (3) MG/3ML nebulizer solution 3 mL (3 mLs Nebulization Given 12/19/21 0107)  ipratropium-albuterol (DUONEB) 0.5-2.5 (3) MG/3ML nebulizer solution 3 mL (3 mLs Nebulization Given 12/19/21 0104)  methylPREDNISolone sodium succinate (SOLU-MEDROL) 125 mg/2 mL injection 125 mg (125 mg Intravenous Given 12/19/21 0105)     ED Discharge Orders     None      Final diagnoses:  Acute respiratory failure with hypoxia and hypercapnia ([place])  Morbid obesity ([place])     Hinda Kehr, MD 12/19/21 [phone]

## 2021-12-18 NOTE — ED Provider Notes (Signed)
[facility] Provider Note    Event Date/Time   First MD Initiated Contact with Patient 12/18/21 2244     (approximate)   History   Respiratory Distress   HPI  Gary [name] is a 66 y.o. male with complex medical history including a history of intubation back in June with subsequent tracheostomy placement on 6/20 who comes in with SOB.  Patient reports worsening shortness of breath over the past few days.  He reports being on 6 to 7 L with a trach collar.  He has a speaking valve currently in.  He reports that he was recently discharged from a rehab facility back home and he supposed to be eating 16 hours of care during the day but nobody has been coming in to help him he.   Physical Exam   Triage Vital Signs: ED Triage Vitals  Enc Vitals Group     BP      Pulse      Resp      Temp      Temp src      SpO2      Weight      Height      Head Circumference      Peak Flow      Pain Score      Pain Loc      Pain Edu?      Excl. in [place]?     Most recent vital signs: Vitals:   12/18/21 2243  BP: (!) 149/99  Pulse: (!) 101  Resp: (!) 48  SpO2: (!) 85%     General: Awake, no distress.  Morbidly obese male CV:  Good peripheral perfusion.  Resp:  Normal effort.  Abd:  No distention.  Other:  Patient has a tracheostomy noted with speaking valve on it.   ED Results / Procedures / Treatments   Labs (all labs ordered are listed, but only abnormal results are displayed) Labs Reviewed  CULTURE, BLOOD (ROUTINE X 2)  CULTURE, BLOOD (ROUTINE X 2)  SARS CORONAVIRUS 2 BY RT PCR  CBC WITH DIFFERENTIAL/PLATELET  COMPREHENSIVE METABOLIC PANEL  BRAIN NATRIURETIC PEPTIDE  LACTIC ACID, PLASMA  LACTIC ACID, PLASMA  PROCALCITONIN  PROCALCITONIN  BLOOD GAS, VENOUS  TROPONIN I (HIGH SENSITIVITY)     EKG  My interpretation of EKG:  Normal sinus rtachy rate of 100 without any obvious ST elevation or T wave inversion but is a lot of artifact noted  and they tried to repeat it without being able to do it  RADIOLOGY I have reviewed the xray personally and interpreted and patient does appear to have some bilateral opacity  PROCEDURES:  Critical Care performed: Yes, see critical care procedure note(s)  .1-3 Lead EKG Interpretation  Performed by: Vanessa Fraser, MD Authorized by: Vanessa Irwin, MD     Interpretation: abnormal     ECG rate:  100   ECG rate assessment: tachycardic     Rhythm: sinus tachycardia     Ectopy: none     Conduction: normal   .Critical Care  Performed by: Vanessa [place], MD Authorized by: Vanessa , MD   Critical care provider statement:    Critical care time (minutes):  30   Critical care was necessary to treat or prevent imminent or life-threatening deterioration of the following conditions:  Respiratory failure   Critical care was time spent personally by me on the following activities:  Development of treatment plan with patient or  surrogate, discussions with consultants, evaluation of patient's response to treatment, examination of patient, ordering and review of laboratory studies, ordering and review of radiographic studies, ordering and performing treatments and interventions, pulse oximetry, re-evaluation of patient's condition and review of old [place] ED: Medications - No data to display   IMPRESSION / MDM / [place] / ED COURSE  I reviewed the triage vital signs and the nursing notes.   Patient's presentation is most consistent with acute presentation with potential threat to life or bodily function.   Differential includes CHF, pneumonia, COVID, tracheitis.  Patient is on Eliquis so unlikely to be PE will get chest x-ray, labs.  Unclear if patient's been compliant with his medications however.  Patient be handed off to the oncoming team pending these.  Respiratory was called and patient was placed on oxygen.'  Chest xray bilateral opacities.   The  patient is on the cardiac monitor to evaluate for evidence of arrhythmia and/or significant heart rate changes.      FINAL CLINICAL IMPRESSION(S) / ED DIAGNOSES   Final diagnoses:  Acute respiratory failure, unspecified whether with hypoxia or hypercapnia ([place])     Rx / DC Orders   ED Discharge Orders     None        Note:  This document was prepared using Dragon voice recognition software and may include unintentional dictation errors.   Vanessa Watkins, MD 12/18/21 [phone]

## 2021-12-18 NOTE — Progress Notes (Signed)
Placed pt on 60% trach collar and suctioned thick tan secretions from trach O2 sat currently 94. Pt in no distress and sleeping.

## 2021-12-19 DIAGNOSIS — Z79899 Other long term (current) drug therapy: Secondary | ICD-10-CM | POA: Diagnosis not present

## 2021-12-19 DIAGNOSIS — I130 Hypertensive heart and chronic kidney disease with heart failure and stage 1 through stage 4 chronic kidney disease, or unspecified chronic kidney disease: Secondary | ICD-10-CM | POA: Diagnosis present

## 2021-12-19 DIAGNOSIS — F1910 Other psychoactive substance abuse, uncomplicated: Secondary | ICD-10-CM | POA: Diagnosis not present

## 2021-12-19 DIAGNOSIS — J441 Chronic obstructive pulmonary disease with (acute) exacerbation: Secondary | ICD-10-CM | POA: Diagnosis present

## 2021-12-19 DIAGNOSIS — Z20822 Contact with and (suspected) exposure to covid-19: Secondary | ICD-10-CM | POA: Diagnosis present

## 2021-12-19 DIAGNOSIS — J9621 Acute and chronic respiratory failure with hypoxia: Secondary | ICD-10-CM | POA: Diagnosis present

## 2021-12-19 DIAGNOSIS — J9622 Acute and chronic respiratory failure with hypercapnia: Secondary | ICD-10-CM | POA: Diagnosis present

## 2021-12-19 DIAGNOSIS — N1831 Chronic kidney disease, stage 3a: Secondary | ICD-10-CM | POA: Diagnosis present

## 2021-12-19 DIAGNOSIS — Z9981 Dependence on supplemental oxygen: Secondary | ICD-10-CM | POA: Diagnosis not present

## 2021-12-19 DIAGNOSIS — E875 Hyperkalemia: Secondary | ICD-10-CM | POA: Diagnosis present

## 2021-12-19 DIAGNOSIS — E785 Hyperlipidemia, unspecified: Secondary | ICD-10-CM | POA: Diagnosis present

## 2021-12-19 DIAGNOSIS — J9602 Acute respiratory failure with hypercapnia: Secondary | ICD-10-CM | POA: Diagnosis not present

## 2021-12-19 DIAGNOSIS — I5033 Acute on chronic diastolic (congestive) heart failure: Secondary | ICD-10-CM | POA: Diagnosis present

## 2021-12-19 DIAGNOSIS — J9601 Acute respiratory failure with hypoxia: Secondary | ICD-10-CM | POA: Diagnosis not present

## 2021-12-19 DIAGNOSIS — Z9049 Acquired absence of other specified parts of digestive tract: Secondary | ICD-10-CM | POA: Diagnosis not present

## 2021-12-19 DIAGNOSIS — Z8616 Personal history of COVID-19: Secondary | ICD-10-CM | POA: Diagnosis not present

## 2021-12-19 DIAGNOSIS — Z7901 Long term (current) use of anticoagulants: Secondary | ICD-10-CM | POA: Diagnosis not present

## 2021-12-19 DIAGNOSIS — Z91199 Patient's noncompliance with other medical treatment and regimen due to unspecified reason: Secondary | ICD-10-CM | POA: Diagnosis not present

## 2021-12-19 DIAGNOSIS — G928 Other toxic encephalopathy: Secondary | ICD-10-CM | POA: Diagnosis present

## 2021-12-19 DIAGNOSIS — Z930 Tracheostomy status: Secondary | ICD-10-CM | POA: Diagnosis not present

## 2021-12-19 DIAGNOSIS — Z6841 Body Mass Index (BMI) 40.0 and over, adult: Secondary | ICD-10-CM | POA: Diagnosis not present

## 2021-12-19 DIAGNOSIS — Z87891 Personal history of nicotine dependence: Secondary | ICD-10-CM | POA: Diagnosis not present

## 2021-12-19 DIAGNOSIS — Z7951 Long term (current) use of inhaled steroids: Secondary | ICD-10-CM | POA: Diagnosis not present

## 2021-12-19 DIAGNOSIS — E8729 Other acidosis: Secondary | ICD-10-CM | POA: Diagnosis present

## 2021-12-19 DIAGNOSIS — F331 Major depressive disorder, recurrent, moderate: Secondary | ICD-10-CM | POA: Diagnosis present

## 2021-12-19 DIAGNOSIS — G629 Polyneuropathy, unspecified: Secondary | ICD-10-CM | POA: Diagnosis present

## 2021-12-19 DIAGNOSIS — E662 Morbid (severe) obesity with alveolar hypoventilation: Secondary | ICD-10-CM | POA: Diagnosis present

## 2021-12-19 LAB — BLOOD GAS, ARTERIAL
Acid-Base Excess: 6.6 mmol/L — ABNORMAL HIGH (ref 0.0–2.0)
Bicarbonate: 34.8 mmol/L — ABNORMAL HIGH (ref 20.0–28.0)
FIO2: 50 %
MECHVT: 500 mL
Mechanical Rate: 18
O2 Saturation: 83.2 %
PEEP: 5 cmH2O
Patient temperature: 37
pCO2 arterial: 66 mmHg (ref 32–48)
pH, Arterial: 7.33 — ABNORMAL LOW (ref 7.35–7.45)
pO2, Arterial: 52 mmHg — ABNORMAL LOW (ref 83–108)

## 2021-12-19 LAB — BASIC METABOLIC PANEL
Anion gap: 5 (ref 5–15)
BUN: 25 mg/dL — ABNORMAL HIGH (ref 8–23)
CO2: 30 mmol/L (ref 22–32)
Calcium: 8.6 mg/dL — ABNORMAL LOW (ref 8.9–10.3)
Chloride: 107 mmol/L (ref 98–111)
Creatinine, Ser: 1.21 mg/dL (ref 0.61–1.24)
GFR, Estimated: >60 mL/min (ref >60)
Glucose, Bld: 125 mg/dL — ABNORMAL HIGH (ref 70–99)
Potassium: 5 mmol/L (ref 3.5–5.1)
Sodium: 142 mmol/L (ref 135–145)

## 2021-12-19 LAB — GLUCOSE, CAPILLARY: Glucose-Capillary: 122 mg/dL — ABNORMAL HIGH (ref 70–99)

## 2021-12-19 LAB — CBC
HCT: 39 % (ref 39.0–52.0)
Hemoglobin: 9.8 g/dL — ABNORMAL LOW (ref 13.0–17.0)
MCH: 22.3 pg — ABNORMAL LOW (ref 26.0–34.0)
MCHC: 25.1 g/dL — ABNORMAL LOW (ref 30.0–36.0)
MCV: 88.8 fL (ref 80.0–100.0)
Platelets: 204 10*3/uL (ref 150–400)
RBC: 4.39 MIL/uL (ref 4.22–5.81)
RDW: 18.5 % — ABNORMAL HIGH (ref 11.5–15.5)
WBC: 7.5 10*3/uL (ref 4.0–10.5)
nRBC: 0.5 % — ABNORMAL HIGH (ref 0.0–0.2)

## 2021-12-19 LAB — SARS CORONAVIRUS 2 BY RT PCR: SARS Coronavirus 2 by RT PCR: NEGATIVE

## 2021-12-19 LAB — TROPONIN I (HIGH SENSITIVITY): Troponin I (High Sensitivity): 8 ng/L (ref <18)

## 2021-12-19 LAB — PROCALCITONIN: Procalcitonin: <0.1 ng/mL

## 2021-12-19 LAB — MAGNESIUM: Magnesium: 2.4 mg/dL (ref 1.7–2.4)

## 2021-12-19 LAB — LACTIC ACID, PLASMA: Lactic Acid, Venous: 0.9 mmol/L (ref 0.5–1.9)

## 2021-12-19 LAB — MRSA NEXT GEN BY PCR, NASAL: MRSA by PCR Next Gen: NOT DETECTED

## 2021-12-19 LAB — PHOSPHORUS: Phosphorus: 4 mg/dL (ref 2.5–4.6)

## 2021-12-19 MED ORDER — PANTOPRAZOLE 2 MG/ML SUSPENSION
40.0000 mg | Freq: Every day | ORAL | Status: DC
Start: 2021-12-19 — End: 2021-12-19

## 2021-12-19 MED ORDER — ESCITALOPRAM OXALATE 10 MG PO TABS
5.0000 mg | ORAL_TABLET | Freq: Every day | ORAL | Status: DC
  Administered 2021-12-20 – 2021-12-26 (×7): 5 mg via ORAL
  Filled 2021-12-19: qty 1
  Filled 2021-12-19 (×5): qty 0.5
  Filled 2021-12-19 (×2): qty 1

## 2021-12-19 MED ORDER — ORAL CARE MOUTH RINSE
15.0000 mL | OROMUCOSAL | Status: DC | PRN
Start: 2021-12-19 — End: 2021-12-26

## 2021-12-19 MED ORDER — CHLORHEXIDINE GLUCONATE CLOTH 2 % EX PADS
6.0000 | MEDICATED_PAD | Freq: Every day | CUTANEOUS | Status: DC
  Administered 2021-12-19 – 2021-12-23 (×4): 6 via TOPICAL

## 2021-12-19 MED ORDER — ENOXAPARIN SODIUM 100 MG/ML IJ SOSY: 0.5000 mg/kg | PREFILLED_SYRINGE | INTRAMUSCULAR | Status: DC

## 2021-12-19 MED ORDER — METHYLPREDNISOLONE SODIUM SUCC 40 MG IJ SOLR
40.0000 mg | Freq: Two times a day (BID) | INTRAMUSCULAR | Status: DC
  Administered 2021-12-19 – 2021-12-20 (×3): 40 mg via INTRAVENOUS
  Filled 2021-12-19 (×3): qty 1

## 2021-12-19 MED ORDER — ACETAMINOPHEN 325 MG PO TABS: 650.0000 mg | ORAL_TABLET | ORAL | Status: DC | PRN

## 2021-12-19 MED ORDER — IPRATROPIUM-ALBUTEROL 0.5-2.5 (3) MG/3ML IN SOLN
3.0000 mL | Freq: Once | RESPIRATORY_TRACT | Status: AC
  Administered 2021-12-19: 3 mL via RESPIRATORY_TRACT
  Filled 2021-12-19: qty 3

## 2021-12-19 MED ORDER — ENOXAPARIN SODIUM 100 MG/ML IJ SOSY
0.5000 mg/kg | PREFILLED_SYRINGE | INTRAMUSCULAR | Status: DC
  Administered 2021-12-19 – 2021-12-26 (×7): 95 mg via SUBCUTANEOUS
  Filled 2021-12-19 (×8): qty 0.95

## 2021-12-19 MED ORDER — SIMVASTATIN 20 MG PO TABS
10.0000 mg | ORAL_TABLET | Freq: Every day | ORAL | Status: DC
  Administered 2021-12-20 – 2021-12-25 (×6): 10 mg via ORAL
  Filled 2021-12-19 (×6): qty 1

## 2021-12-19 MED ORDER — ENOXAPARIN SODIUM 40 MG/0.4ML IJ SOSY: 40.0000 mg | PREFILLED_SYRINGE | INTRAMUSCULAR | Status: DC

## 2021-12-19 MED ORDER — PANTOPRAZOLE SODIUM 40 MG IV SOLR
40.0000 mg | INTRAVENOUS | Status: DC
  Administered 2021-12-19 – 2021-12-20 (×2): 40 mg via INTRAVENOUS
  Filled 2021-12-19 (×2): qty 10

## 2021-12-19 MED ORDER — APIXABAN 2.5 MG PO TABS: 2.5000 mg | ORAL_TABLET | Freq: Two times a day (BID) | ORAL | Status: DC

## 2021-12-19 MED ORDER — ORAL CARE MOUTH RINSE
15.0000 mL | OROMUCOSAL | Status: DC
  Administered 2021-12-19 – 2021-12-20 (×9): 15 mL via OROMUCOSAL
  Filled 2021-12-19 (×10): qty 15

## 2021-12-19 MED ORDER — FUROSEMIDE 10 MG/ML IJ SOLN
40.0000 mg | Freq: Once | INTRAMUSCULAR | Status: AC
  Administered 2021-12-19: 40 mg via INTRAVENOUS
  Filled 2021-12-19: qty 4

## 2021-12-19 MED ORDER — IPRATROPIUM-ALBUTEROL 0.5-2.5 (3) MG/3ML IN SOLN
3.0000 mL | Freq: Four times a day (QID) | RESPIRATORY_TRACT | Status: DC
Start: 2021-12-19 — End: 2021-12-24
  Administered 2021-12-19 – 2021-12-24 (×22): 3 mL via RESPIRATORY_TRACT
  Filled 2021-12-19 (×22): qty 3

## 2021-12-19 MED ORDER — POLYETHYLENE GLYCOL 3350 17 G PO PACK: 17.0000 g | PACK | Freq: Every day | ORAL | Status: DC | PRN

## 2021-12-19 MED ORDER — METHYLPREDNISOLONE SODIUM SUCC 125 MG IJ SOLR
125.0000 mg | Freq: Once | INTRAMUSCULAR | Status: AC
  Administered 2021-12-19: 125 mg via INTRAVENOUS
  Filled 2021-12-19: qty 2

## 2021-12-19 MED ORDER — DOCUSATE SODIUM 100 MG PO CAPS: 100.0000 mg | ORAL_CAPSULE | Freq: Two times a day (BID) | ORAL | Status: DC | PRN

## 2021-12-19 MED ORDER — ORAL CARE MOUTH RINSE
15.0000 mL | OROMUCOSAL | Status: DC
  Administered 2021-12-20 – 2021-12-26 (×39): 15 mL via OROMUCOSAL

## 2021-12-19 MED ORDER — BUDESONIDE 0.25 MG/2ML IN SUSP
0.2500 mg | Freq: Two times a day (BID) | RESPIRATORY_TRACT | Status: DC
Start: 2021-12-19 — End: 2021-12-19

## 2021-12-19 NOTE — ED Notes (Signed)
RT at bedside.

## 2021-12-19 NOTE — Progress Notes (Signed)
Pt refused lab work, pt educated but still refuses, MD notified of pt's refusal.

## 2021-12-19 NOTE — Progress Notes (Signed)
PHARMACIST - PHYSICIAN COMMUNICATION  CONCERNING:  Enoxaparin (Lovenox) for DVT Prophylaxis    RECOMMENDATION: Patient was prescribed enoxaprin '40mg'$  q24 hours for VTE prophylaxis.   Filed Weights   12/18/21 2245  Weight: (!) 190 kg (418 lb 14 oz)    Body mass index is 52.36 kg/m.  Estimated Creatinine Clearance: 107.9 mL/min (by C-G formula based on SCr of 1.24 mg/dL).   Based on [place] patient is candidate for enoxaparin 0.'5mg'$ /kg TBW SQ every 24 hours based on BMI being >30.  DESCRIPTION: Pharmacy has adjusted enoxaparin dose per [facility] policy.  Patient is now receiving enoxaparin 0.5 mg/kg every 24 hours   Renda Rolls, PharmD, [facility] 12/19/2021 1:17 AM

## 2021-12-19 NOTE — Progress Notes (Signed)
Trach suctioned for lg amt thick tan secretions 

## 2021-12-19 NOTE — Consult Note (Addendum)
PHARMACY CONSULT NOTE - FOLLOW UP  Pharmacy Consult for Electrolyte Monitoring and Replacement   Recent Labs: Potassium (mmol/L)  Date Value  12/19/2021 5.0  04/15/2014 3.9   Magnesium (mg/dL)  Date Value  12/19/2021 2.4  08/21/2012 1.9   Calcium (mg/dL)  Date Value  12/19/2021 8.6 (L)   Calcium, Total (mg/dL)  Date Value  04/15/2014 8.3 (L)   Albumin (g/dL)  Date Value  12/18/2021 3.1 (L)  04/15/2014 2.9 (L)   Phosphorus (mg/dL)  Date Value  12/19/2021 4.0   Sodium (mmol/L)  Date Value  12/19/2021 142  03/15/2020 145 (H)  04/15/2014 140     Assessment: Gary [name] is a 66 y.o. male presenting with worsening SOB x2 days. PMH significant for HFpEF, COPD, OSA, TUD, HTN, obesity. Plan to admit to ICU for acute on chronic hypoxic/hypercapnic respiratory failure requiring mechanical ventilatory support via chronic tracheostomy. Pharmacy has been consulted to monitor and replace electrolytes.   Goal of Therapy:  Electrolytes WNL  Plan:  Electrolytes WNL, no replacement needed  Continue to monitor electrolytes and replace PRN  Thank you for allowing pharmacy to be a part of this patient's care.  Gretel Acre, PharmD PGY1 Pharmacy Resident 12/19/2021 9:57 AM
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
## 2021-12-20 DIAGNOSIS — J9601 Acute respiratory failure with hypoxia: Secondary | ICD-10-CM | POA: Diagnosis not present

## 2021-12-20 LAB — BASIC METABOLIC PANEL
Anion gap: 6 (ref 5–15)
BUN: 35 mg/dL — ABNORMAL HIGH (ref 8–23)
CO2: 31 mmol/L (ref 22–32)
Calcium: 8.7 mg/dL — ABNORMAL LOW (ref 8.9–10.3)
Chloride: 104 mmol/L (ref 98–111)
Creatinine, Ser: 1.1 mg/dL (ref 0.61–1.24)
GFR, Estimated: >60 mL/min (ref >60)
Glucose, Bld: 105 mg/dL — ABNORMAL HIGH (ref 70–99)
Potassium: 4.1 mmol/L (ref 3.5–5.1)
Sodium: 141 mmol/L (ref 135–145)

## 2021-12-20 LAB — CBC
HCT: 36 % — ABNORMAL LOW (ref 39.0–52.0)
Hemoglobin: 9.7 g/dL — ABNORMAL LOW (ref 13.0–17.0)
MCH: 22 pg — ABNORMAL LOW (ref 26.0–34.0)
MCHC: 26.9 g/dL — ABNORMAL LOW (ref 30.0–36.0)
MCV: 81.6 fL (ref 80.0–100.0)
Platelets: 216 10*3/uL (ref 150–400)
RBC: 4.41 MIL/uL (ref 4.22–5.81)
RDW: 18.3 % — ABNORMAL HIGH (ref 11.5–15.5)
WBC: 7.9 10*3/uL (ref 4.0–10.5)
nRBC: 0.3 % — ABNORMAL HIGH (ref 0.0–0.2)

## 2021-12-20 LAB — MAGNESIUM: Magnesium: 2.4 mg/dL (ref 1.7–2.4)

## 2021-12-20 LAB — PHOSPHORUS: Phosphorus: 2.7 mg/dL (ref 2.5–4.6)

## 2021-12-20 LAB — PROCALCITONIN: Procalcitonin: <0.1 ng/mL

## 2021-12-20 MED ORDER — TORSEMIDE 20 MG PO TABS
20.0000 mg | ORAL_TABLET | Freq: Every day | ORAL | Status: DC
  Administered 2021-12-20 – 2021-12-22 (×3): 20 mg via ORAL
  Filled 2021-12-20 (×3): qty 1

## 2021-12-20 MED ORDER — MELATONIN 5 MG PO TABS
5.0000 mg | ORAL_TABLET | Freq: Every day | ORAL | Status: DC
  Administered 2021-12-20 – 2021-12-25 (×6): 5 mg via ORAL
  Filled 2021-12-20 (×6): qty 1

## 2021-12-20 MED ORDER — BISACODYL 10 MG RE SUPP: 10.0000 mg | Freq: Every day | RECTAL | Status: DC | PRN

## 2021-12-20 MED ORDER — VITAMIN C 500 MG PO TABS
500.0000 mg | ORAL_TABLET | Freq: Two times a day (BID) | ORAL | Status: DC
  Administered 2021-12-20 – 2021-12-26 (×13): 500 mg via ORAL
  Filled 2021-12-20 (×13): qty 1

## 2021-12-20 MED ORDER — BENZONATATE 100 MG PO CAPS
100.0000 mg | ORAL_CAPSULE | Freq: Three times a day (TID) | ORAL | Status: DC | PRN
  Administered 2021-12-22 – 2021-12-26 (×7): 100 mg via ORAL
  Filled 2021-12-20 (×7): qty 1

## 2021-12-20 MED ORDER — POLYETHYLENE GLYCOL 3350 17 G PO PACK: 17.0000 g | PACK | Freq: Every day | ORAL | Status: DC | PRN

## 2021-12-20 MED ORDER — PANTOPRAZOLE SODIUM 40 MG PO TBEC
40.0000 mg | DELAYED_RELEASE_TABLET | Freq: Two times a day (BID) | ORAL | Status: DC
  Administered 2021-12-20 – 2021-12-26 (×13): 40 mg via ORAL
  Filled 2021-12-20 (×13): qty 1

## 2021-12-20 MED ORDER — QUETIAPINE FUMARATE 25 MG PO TABS: 25.0000 mg | ORAL_TABLET | Freq: Two times a day (BID) | ORAL | Status: DC | PRN

## 2021-12-20 MED ORDER — HYDROXYZINE HCL 25 MG PO TABS
25.0000 mg | ORAL_TABLET | Freq: Three times a day (TID) | ORAL | Status: DC | PRN
Start: 2021-12-20 — End: 2021-12-26
  Administered 2021-12-25 (×2): 25 mg via ORAL
  Filled 2021-12-20 (×3): qty 1

## 2021-12-20 MED ORDER — GABAPENTIN 400 MG PO CAPS
400.0000 mg | ORAL_CAPSULE | Freq: Three times a day (TID) | ORAL | Status: DC
  Administered 2021-12-20 – 2021-12-26 (×20): 400 mg via ORAL
  Filled 2021-12-20 (×20): qty 1
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
## 2021-12-20 NOTE — TOC Initial Note (Signed)
Transition of Care [facility]) - Initial/Assessment Note    Patient Details  Name: Gary [name] MRN: [id number] Date of Birth: Oct 13, 1955  Transition of [facility]) CM/SW Contact:    Shelbie Hutching, RN Phone Number: 12/20/2021, 4:15 PM  Clinical Narrative:                 Patient back in the hospital for Acute on Chronic Respiratory failure, sats in the 50's when EMS arrived at the home.  Patient is doing much better, he declined to wear the vent last night telling respiratory that he felt like he was suffocating.  Plan to try Trilogy here in the hospital to see if he will tolerate.  IF patient agrees to wear Trilogy overnight RNCM will reach out to [employer] tomorrow to get home Trilogy arranged.  Patient reported to the nursing staff that [facility] had not been going out.  RNCM called Rob with [facility] and he says that the patient has had nursing care going out but that there were some communication barriers with the patient and caregiver.  Rob has 4 nurses that are interested in working the case.  They are prepared to go out whenever patient is discharged back home.    RNCM has already notified Zach with [employer] that we might be getting the Trilogy will follow with him in the am.   Expected Discharge Plan: [place] Barriers to Discharge: Continued Medical Work up   Patient Goals and CMS Choice   CMS Medicare.gov Compare Post Acute Care list provided to:: Patient Choice offered to / list presented to : Patient  Expected Discharge Plan and Services Expected Discharge Plan: [place]   Discharge Planning Services: CM Consult Post Acute Care Choice: [place] arrangements for the past 2 months: [place] Arranged: RN [place] Agency: [place] Date [place]: 12/20/21 Time [place] Agency Contacted: 14 Representative spoke with at [place]: Rob with [facility] home care  Prior Living  Arrangements/Services Living arrangements for the past 2 months: [place] with:: Significant Other Patient language and need for interpreter reviewed:: Yes Do you feel safe going back to the place where you live?: Yes      Need for Family Participation in Patient Care: Yes (Comment) Care giver support system in place?: Yes (comment) Current home services: DME (oxygen, trach collar and supplies, hospital bed, lift) Criminal Activity/Legal Involvement Pertinent to Current Situation/Hospitalization: No - Comment as needed  Activities of Daily Living Home Assistive Devices/Equipment: Oxygen, Vent/Trach supplies ADL Screening (condition at time of admission) Patient's cognitive ability adequate to safely complete daily activities?: Yes Is the patient deaf or have difficulty hearing?: No Does the patient have difficulty seeing, even when wearing glasses/contacts?: No Does the patient have difficulty concentrating, remembering, or making decisions?: No Patient able to express need for assistance with ADLs?: Yes Does the patient have difficulty dressing or bathing?: Yes Independently performs ADLs?: Yes (appropriate for developmental age) Does the patient have difficulty walking or climbing stairs?: Yes Weakness of Legs: Both Weakness of Arms/Hands: None  Permission Sought/Granted Permission sought to share information with : Case Manager, Family Supports, Other (comment) Permission granted to share information with : Yes, Verbal Permission Granted  Share Information with NAME: [name]  Permission granted to share  info w AGENCY: [facility], [facility]  Permission granted to share info w Relationship: sister  Permission granted to share info w Contact Information: [phone]  Emotional Assessment Appearance:: Appears stated age Attitude/Demeanor/Rapport: Engaged Affect (typically observed): Accepting Orientation: : Oriented to Self, Oriented to Place, Oriented to  Time,  Oriented to Situation Alcohol / Substance Use: Not Applicable Psych Involvement: No (comment)  Admission diagnosis:  Morbid obesity ([place]) [E66.01] Acute on chronic respiratory failure ([place]) [J96.20] Acute respiratory failure with hypoxia and hypercapnia ([name]) [J96.01, J96.02] Patient Active Problem List   Diagnosis Date Noted   Hematochezia    Bloody stool 11/12/2021   Major depressive disorder, recurrent episode, moderate ([place]) 10/22/2021   Pressure injury of skin 09/27/2021   Acute on chronic respiratory failure with hypercapnia ([place]) 08/07/2021   COPD (chronic obstructive pulmonary disease) ([name])    HLD (hyperlipidemia)    Iron deficiency anemia    Chronic kidney disease, stage 3a ([place]) 07/22/2021   Acute cardiac pulmonary edema ([name]) 07/22/2021   Acute on chronic respiratory failure with hypoxia and hypercapnia ([name]) 07/20/2021   Chronic obstructive pulmonary disease ([name])    Positive D dimer    CHF exacerbation ([place]) 05/03/2021   Acute CHF (congestive heart failure) ([place]) 04/04/2021   Weakness    Pneumonia 03/11/2021   Acute respiratory failure with hypoxia and hypercapnia ([place]) 03/11/2021   (HFpEF) heart failure with preserved ejection fraction ([name])    Chronic respiratory failure with hypoxia ([place])    Hyperlipidemia 02/19/2021   Acute respiratory distress syndrome (ARDS) due to COVID-19 virus ([place])    Acute on chronic respiratory failure ([place]) 02/18/2021   Generalized osteoarthritis of multiple sites 10/31/2020   AKI (acute kidney injury) ([place]) 03/04/2020   COPD with acute exacerbation ([place]) 03/02/2020   Marijuana use 01/17/2020   Thrombocytopenia ([place]) 01/17/2020   Positive hepatitis C antibody test 01/17/2020   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 mass index (BMI) of 50.0 to 59.9 in adult ([place]) 01/17/2020   Osteoarthritis of glenohumeral joints (Bilateral) 01/17/2020   Osteoarthritis of AC (acromioclavicular) joints (Bilateral) 01/17/2020    Polysubstance abuse ([place]) 62/83/1517   Acute metabolic encephalopathy 61/60/7371   Chronic pain syndrome 07/14/2019   DDD (degenerative disc disease), lumbosacral 07/14/2019   DDD (degenerative disc disease), cervical 07/14/2019   CHF (congestive heart failure) ([place]) 04/12/2017   Acute on chronic diastolic CHF (congestive heart failure) ([place]) 02/28/2015   Obstructive sleep apnea 02/28/2015   Acute on chronic diastolic congestive heart failure ([name])    Acute on chronic respiratory failure with hypoxia ([place])    Morbid obesity ([place]) 04/01/2014   HTN (hypertension) 04/01/2014   PCP:  [facility], [place]:   [place], [place] - [address] [place] [address] Phone: [phone] Fax: [phone]     Social Determinants of Health (SDOH) Interventions    Readmission Risk Interventions    05/04/2021    1:26 PM 04/07/2021    1:39 PM 03/12/2021   11:54 AM  Readmission Risk Prevention Plan  Transportation Screening Complete Complete Complete  PCP or Specialist Appt within 3-5 Days  Complete Complete  HRI or [place]   Complete  Social Work Consult for [place] Planning/Counseling   Complete  Palliative Care Screening  Not Applicable Not Applicable  Medication Review Press photographer) Complete Complete Complete  PCP or Specialist appointment within 3-5 days of discharge Complete    HRI or Home Care Consult Complete  SW Recovery Care/Counseling Consult Complete    Palliative Care Screening Not Applicable    Skilled Nursing Facility Not Applicable

## 2021-12-20 NOTE — Consult Note (Signed)
PHARMACY CONSULT NOTE - FOLLOW UP  Pharmacy Consult for Electrolyte Monitoring and Replacement   Recent Labs: Potassium (mmol/L)  Date Value  12/20/2021 4.1  04/15/2014 3.9   Magnesium (mg/dL)  Date Value  12/20/2021 2.4  08/21/2012 1.9   Calcium (mg/dL)  Date Value  12/20/2021 8.7 (L)   Calcium, Total (mg/dL)  Date Value  04/15/2014 8.3 (L)   Albumin (g/dL)  Date Value  12/18/2021 3.1 (L)  04/15/2014 2.9 (L)   Phosphorus (mg/dL)  Date Value  12/20/2021 2.7   Sodium (mmol/L)  Date Value  12/20/2021 141  03/15/2020 145 (H)  04/15/2014 140     Assessment: Gary [name] is a 66 y.o. male presenting with worsening SOB x2 days. PMH significant for HFpEF, COPD, OSA, TUD, HTN, obesity. Plan to admit to ICU for acute on chronic hypoxic/hypercapnic respiratory failure requiring mechanical ventilatory support via chronic tracheostomy. Pharmacy has been consulted to monitor and replace electrolytes.   Goal of Therapy:  Electrolytes WNL  Plan:  Scr stable 1.21>1.1; I/O Net -1L Electrolytes remain WNL, no replacement needed today. Continue to monitor electrolytes and replace PRN  Thank you for allowing pharmacy to be a part of this patient's care.  Lorna Dibble, PharmD, [facility] Clinical Pharmacist 12/20/2021 8:50 AM

## 2021-12-20 NOTE — Evaluation (Signed)
Physical Therapy Evaluation Patient Details Name: Gary [name] MRN: [id number] DOB: Sep 14, 1955 Today's Date: 12/20/2021  History of Present Illness  Patient was recently discharged on 12/13/21 after a prolonged admission for respiratory failure for a total of 2 months. This was complicated by tracheostomy placement on 10/03/21. During this admission after being weaned from the ventilator to trach collar, the patient had episodes of hypercapnia, requiring ventilator support particularly at night. However, patient was non compliant and would refuse to wear the ventilator overnight to prevent hypercapnia. PCCM team recommended triology home vent at discharge, however due to inpatient noncompliance he was discharged home with home health support and DME including trach collar oxygen. Now admitted via EMS on 12/18/21 with complaints of worsening shortness of breath over the past 2 days.  Clinical Impression  Pt is a pleasant 66 year old male who was admitted for acute on chronic resp failure. Pt performs bed mobility with mod assist and unable to further perform transfers at this time. PT assisted in using hoyer lift to move pt to specialty mattress and assisted with repositioning. Pt demonstrates deficits with strength/mobility. Would benefit from skilled PT to address above deficits and promote optimal return to PLOF. Recommend transition to [place] upon discharge from acute hospitalization. Pt is close to baseline level and has equipment in place for successful home discharge.      Recommendations for follow up therapy are one component of a multi-disciplinary discharge planning process, led by the attending physician.  Recommendations may be updated based on patient status, additional functional criteria and insurance authorization.  Follow Up Recommendations Home health PT Can patient physically be transported by private vehicle: No    Assistance Recommended at Discharge Frequent or constant  Supervision/Assistance  Patient can return home with the following  Two people to help with walking and/or transfers;Two people to help with bathing/dressing/bathroom;Assistance with cooking/housework;Assist for transportation;Help with stairs or ramp for entrance    Equipment Recommendations None recommended by PT  Recommendations for Other Services       Functional Status Assessment Patient has had a recent decline in their functional status and/or demonstrates limited ability to make significant improvements in function in a reasonable and predictable amount of time     Precautions / Restrictions Precautions Precautions: Fall Precaution Comments: trach Restrictions Weight Bearing Restrictions: No      Mobility  Bed Mobility Overal bed mobility: Needs Assistance Bed Mobility: Rolling Rolling: Mod assist         General bed mobility comments: able to roll x B sides multiple times for linen change and lift pad placement. Unable to tolerate further bed mobility.    Transfers                   General transfer comment: unable    Ambulation/Gait                  Stairs            Wheelchair Mobility    Modified Rankin (Stroke Patients Only)       Balance                                             Pertinent Vitals/Pain Pain Assessment Pain Assessment: Faces Faces Pain Scale: Hurts a little bit Pain Location: stiffness Pain Intervention(s): Limited activity within patient's tolerance, Repositioned  Home Living Family/patient expects to be discharged to:: Private residence Living Arrangements: Non-relatives/Friends Available Help at Discharge: Family Type of Home: Apartment Home Access: Level entry;Ramped entrance       Home Layout: One level Home Equipment: [place] - single point;Wheelchair - IT trainer (2 wheels);Hospital bed (hoyer lift)      Prior Function Prior Level of Function : Needs  assist             Mobility Comments: reports he has only used lift for mobility since he has been out of the hospital. Reports he hasn't worked with home PT and home nursing hasn't come out to the house ADLs Comments: needing assist for all mobility     Hand Dominance   Dominant Hand: Right    Extremity/Trunk Assessment   Upper Extremity Assessment Upper Extremity Assessment: Overall WFL for tasks assessed (baseline chronic weakness. no change)    Lower Extremity Assessment Lower Extremity Assessment: Generalized weakness (B LE grossly 2/5; chronic) RLE Sensation: history of peripheral neuropathy LLE Sensation: history of peripheral neuropathy       Communication   Communication: Tracheostomy;Passy-Muir valve  Cognition Arousal/Alertness: Awake/alert Behavior During Therapy: WFL for tasks assessed/performed Overall Cognitive Status: Within Functional Limits for tasks assessed                                 General Comments: somewhat pleasant and initially agreeable to bed level request.        General Comments      Exercises Other Exercises Other Exercises: Pt able to roll to B sides for hygiene due to pt soiled upon arrival. Then able to roll for lift pad placement. Pt then able to be hoyered to different bed. Pt anxious with all mobility. +2 for hoyer   Assessment/Plan    PT Assessment Patient needs continued PT services  PT Problem List Decreased strength;Decreased mobility;Decreased activity tolerance;Cardiopulmonary status limiting activity;Decreased balance;Decreased skin integrity;Obesity       PT Treatment Interventions Functional mobility training;Neuromuscular re-education;Balance training;Therapeutic exercise;Therapeutic activities    PT Goals (Current goals can be found in the Care Plan section)  Acute Rehab PT Goals Patient Stated Goal: go home PT Goal Formulation: With patient Time For Goal Achievement: 01/03/22 Potential to  Achieve Goals: Fair    Frequency Min 2X/week     Co-evaluation               AM-PAC PT "6 Clicks" Mobility  Outcome Measure Help needed turning from your back to your side while in a flat bed without using bedrails?: A Lot Help needed moving from lying on your back to sitting on the side of a flat bed without using bedrails?: A Lot Help needed moving to and from a bed to a chair (including a wheelchair)?: Total Help needed standing up from a chair using your arms (e.g., wheelchair or bedside chair)?: Total Help needed to walk in hospital room?: Total Help needed climbing 3-5 steps with a railing? : Total 6 Click Score: 8    End of Session Equipment Utilized During Treatment: Oxygen Activity Tolerance: Patient tolerated treatment well Patient left: in bed Nurse Communication: Mobility status PT Visit Diagnosis: Unsteadiness on feet (R26.81);Muscle weakness (generalized) (M62.81);Difficulty in walking, not elsewhere classified (R26.2)    Time: 5726-2035 PT Time Calculation (min) (ACUTE ONLY): 28 min   Charges:   PT Evaluation $PT Eval Low Complexity: 1 Low PT Treatments $Therapeutic Activity: 8-22 mins  Greggory Stallion, PT, DPT, GCS [phone]   Gary [name] 12/20/2021, 3:13 PM

## 2021-12-21 DIAGNOSIS — J9602 Acute respiratory failure with hypercapnia: Secondary | ICD-10-CM | POA: Diagnosis not present

## 2021-12-21 DIAGNOSIS — J9601 Acute respiratory failure with hypoxia: Secondary | ICD-10-CM | POA: Diagnosis not present

## 2021-12-21 LAB — CBC WITH DIFFERENTIAL/PLATELET
Abs Immature Granulocytes: 0.01 10*3/uL (ref 0.00–0.07)
Basophils Absolute: 0 10*3/uL (ref 0.0–0.1)
Basophils Relative: 0 %
Eosinophils Absolute: 0 10*3/uL (ref 0.0–0.5)
Eosinophils Relative: 0 %
HCT: 37.1 % — ABNORMAL LOW (ref 39.0–52.0)
Hemoglobin: 9.8 g/dL — ABNORMAL LOW (ref 13.0–17.0)
Immature Granulocytes: 0 %
Lymphocytes Relative: 22 %
Lymphs Abs: 1.3 10*3/uL (ref 0.7–4.0)
MCH: 21.7 pg — ABNORMAL LOW (ref 26.0–34.0)
MCHC: 26.4 g/dL — ABNORMAL LOW (ref 30.0–36.0)
MCV: 82.3 fL (ref 80.0–100.0)
Monocytes Absolute: 0.8 10*3/uL (ref 0.1–1.0)
Monocytes Relative: 15 %
Neutro Abs: 3.5 10*3/uL (ref 1.7–7.7)
Neutrophils Relative %: 63 %
Platelets: 197 10*3/uL (ref 150–400)
RBC: 4.51 MIL/uL (ref 4.22–5.81)
RDW: 18.6 % — ABNORMAL HIGH (ref 11.5–15.5)
WBC: 5.7 10*3/uL (ref 4.0–10.5)
nRBC: 0 % (ref 0.0–0.2)

## 2021-12-21 LAB — BASIC METABOLIC PANEL
Anion gap: 5 (ref 5–15)
BUN: 42 mg/dL — ABNORMAL HIGH (ref 8–23)
CO2: 32 mmol/L (ref 22–32)
Calcium: 8.4 mg/dL — ABNORMAL LOW (ref 8.9–10.3)
Chloride: 104 mmol/L (ref 98–111)
Creatinine, Ser: 1.12 mg/dL (ref 0.61–1.24)
GFR, Estimated: >60 mL/min (ref >60)
Glucose, Bld: 100 mg/dL — ABNORMAL HIGH (ref 70–99)
Potassium: 4.3 mmol/L (ref 3.5–5.1)
Sodium: 141 mmol/L (ref 135–145)

## 2021-12-21 LAB — MAGNESIUM: Magnesium: 2.4 mg/dL (ref 1.7–2.4)

## 2021-12-21 LAB — PHOSPHORUS: Phosphorus: 4.8 mg/dL — ABNORMAL HIGH (ref 2.5–4.6)

## 2021-12-21 NOTE — Consult Note (Signed)
PHARMACY CONSULT NOTE - FOLLOW UP  Pharmacy Consult for Electrolyte Monitoring and Replacement   Recent Labs: Potassium (mmol/L)  Date Value  12/21/2021 4.3  04/15/2014 3.9   Magnesium (mg/dL)  Date Value  12/21/2021 2.4  08/21/2012 1.9   Calcium (mg/dL)  Date Value  12/21/2021 8.4 (L)   Calcium, Total (mg/dL)  Date Value  04/15/2014 8.3 (L)   Albumin (g/dL)  Date Value  12/18/2021 3.1 (L)  04/15/2014 2.9 (L)   Phosphorus (mg/dL)  Date Value  12/21/2021 4.8 (H)   Sodium (mmol/L)  Date Value  12/21/2021 141  03/15/2020 145 (H)  04/15/2014 140     Assessment: Gary [name] is a 66 y.o. male presenting with worsening SOB x2 days. PMH significant for HFpEF, COPD, OSA, TUD, HTN, obesity. Plan to admit to ICU for acute on chronic hypoxic/hypercapnic respiratory failure requiring mechanical ventilatory support via chronic tracheostomy. Pharmacy has been consulted to monitor and replace electrolytes.   Goal of Therapy:  Electrolytes WNL  Plan:  Scr stable 1.1>1.12; I/O Net -2.6L Electrolytes remain WNL, no replacement needed today. Continue to monitor electrolytes and replace PRN  Thank you for allowing pharmacy to be a part of this patient's care.  Lorna Dibble, PharmD, [facility] Clinical Pharmacist 12/21/2021 7:58 AM
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
## 2021-12-22 DIAGNOSIS — J9601 Acute respiratory failure with hypoxia: Secondary | ICD-10-CM | POA: Diagnosis not present

## 2021-12-22 LAB — CBC
HCT: 36.4 % — ABNORMAL LOW (ref 39.0–52.0)
Hemoglobin: 9.7 g/dL — ABNORMAL LOW (ref 13.0–17.0)
MCH: 21.7 pg — ABNORMAL LOW (ref 26.0–34.0)
MCHC: 26.6 g/dL — ABNORMAL LOW (ref 30.0–36.0)
MCV: 81.6 fL (ref 80.0–100.0)
Platelets: 227 10*3/uL (ref 150–400)
RBC: 4.46 MIL/uL (ref 4.22–5.81)
RDW: 18 % — ABNORMAL HIGH (ref 11.5–15.5)
WBC: 6.2 10*3/uL (ref 4.0–10.5)
nRBC: 0 % (ref 0.0–0.2)

## 2021-12-22 LAB — BASIC METABOLIC PANEL
Anion gap: 7 (ref 5–15)
BUN: 41 mg/dL — ABNORMAL HIGH (ref 8–23)
CO2: 34 mmol/L — ABNORMAL HIGH (ref 22–32)
Calcium: 8.5 mg/dL — ABNORMAL LOW (ref 8.9–10.3)
Chloride: 99 mmol/L (ref 98–111)
Creatinine, Ser: 1.35 mg/dL — ABNORMAL HIGH (ref 0.61–1.24)
GFR, Estimated: 59 mL/min — ABNORMAL LOW (ref >60)
Glucose, Bld: 142 mg/dL — ABNORMAL HIGH (ref 70–99)
Potassium: 3.9 mmol/L (ref 3.5–5.1)
Sodium: 140 mmol/L (ref 135–145)

## 2021-12-22 LAB — CULTURE, RESPIRATORY W GRAM STAIN

## 2021-12-22 LAB — MAGNESIUM: Magnesium: 1.7 mg/dL (ref 1.7–2.4)

## 2021-12-22 LAB — PHOSPHORUS: Phosphorus: 3 mg/dL (ref 2.5–4.6)

## 2021-12-22 MED ORDER — ACETAZOLAMIDE 250 MG PO TABS
500.0000 mg | ORAL_TABLET | Freq: Once | ORAL | Status: AC
Start: 2021-12-23 — End: 2021-12-23
  Administered 2021-12-23: 500 mg via ORAL
  Filled 2021-12-22: qty 2

## 2021-12-22 NOTE — Progress Notes (Signed)
RT to patient bedside to place nocturnal vent. Patient asleep upon arrival but stated he, "isn't ready, come back later." This RT advised patient of the importance of wearing vent at night. Patient stated  "stop lecturing and asking questions or I wont wear it at all." Will reattempt placing patient on vent "later".  All trach care completed at this time, patient on TC 5L 28% tolerating well. Thick tan secretions removed from trach.
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
## 2021-12-22 NOTE — Progress Notes (Signed)
Physical Therapy Treatment Patient Details Name: Gary [name] MRN: [id number] DOB: Mar 13, 1956 Today's Date: 12/22/2021   History of Present Illness Patient was recently discharged on 12/13/21 after a prolonged admission for respiratory failure for a total of 2 months. This was complicated by tracheostomy placement on 10/03/21. During this admission after being weaned from the ventilator to trach collar, the patient had episodes of hypercapnia, requiring ventilator support particularly at night. However, patient was non compliant and would refuse to wear the ventilator overnight to prevent hypercapnia. PCCM team recommended triology home vent at discharge, however due to inpatient noncompliance he was discharged home with home health support and DME including trach collar oxygen. Now admitted via EMS on 12/18/21 with complaints of worsening shortness of breath over the past 2 days.    PT Comments    Pt is well known by author from previous admission.Pt refused PT at first however eventually agreeable. " I want to get OOB to recliner." Author had pt participate in PT session prior to assisting RN staff with hoyer transfer into bariatric recliner. He was able to roll with very little assistance. Required mod assist to achieve EOB sitting on L side of bed. Increase time to perform with Vcs throughout for safety and sequencing. Pt fully participated in EOB exercises and then even some supine exercises.He had BM and was able to roll R for hygiene care to be performed. Overall pt tolerated session well. He was limited by unwillingness to attempt standing." My L knee hurts to bad to attempt to stand. Ill try next time." Pt has a fear of falling and was anxious about standing last admission earlier this month. PT will continue to follow per POC. He would benefit from rehab at DC but is unwilling to go. Recommend HHPT + resuming all [place] services he had when Dc'd from acute hospital last time( ~ 1 week prior).     Recommendations for follow up therapy are one component of a multi-disciplinary discharge planning process, led by the attending physician.  Recommendations may be updated based on patient status, additional functional criteria and insurance authorization.  Follow Up Recommendations  Home health PT (pt is unwilling to DC to rehab/Ltach. Planning to retunr home with girlfriend + [facility] services)     Assistance Recommended at Discharge Frequent or constant Supervision/Assistance  Patient can return home with the following Two people to help with walking and/or transfers;Two people to help with bathing/dressing/bathroom;Assistance with cooking/housework;Assist for transportation;Help with stairs or ramp for entrance   Equipment Recommendations  None recommended by PT       Precautions / Restrictions Precautions Precautions: Fall Precaution Comments: cuffed trach (5 L trach collar throughout session. pt has PSV) Restrictions Weight Bearing Restrictions: No     Mobility  Bed Mobility Overal bed mobility: Needs Assistance Bed Mobility: Rolling Rolling: Min assist (iuncreased time and vcs for using LEs to assist. heavy use of bedrails however min assist required to roll R. rolled L without physical assist)   Supine to sit: Mod assist Sit to supine: Max assist   General bed mobility comments: pt was able to sit up on L side of bed. increased time + mod assist to fully achieve EOB short sit. Vcs throughout and heavy use of bed functions. Pt sat EOB x ~ 20 minutes while performing ther ex. unwilling to attempt standing 2/2 to L knee pain. " Ill try to stand next time."    Transfers Overall transfer level: Needs assistance   Transfers: Bed  to chair/wheelchair/BSC    General transfer comment: unsafe to attempt stand pivot to recliner due to pt's weakness in BLES. He did fully participate in sitting EOB and perform exercises while EOB to promote improving balance and LE strength Transfer via  Lift Equipment:  (hoyer lift transfer after session with Chief Strategy Officer + Conservation officer, historic buildings. Baraitric recliner left in room. chair left with R armrest down and BLEs elevated on leg rest. RN staff reports abilities to return pt to bed without assistance from PT department later.)    Balance Overall balance assessment: Needs assistance Sitting-balance support: Feet supported, Bilateral upper extremity supported Sitting balance-[name] Scale: Good Sitting balance - Comments: pt sat EOB with BUE support and at least +1 LE support with supervision only    Standing balance comment: pt was unwilling to attempt standing due to L knee pain       Cognition Arousal/Alertness: Awake/alert Behavior During Therapy: WFL for tasks assessed/performed Overall Cognitive Status: Within Functional Limits for tasks assessed Area of Impairment: Safety/judgement, Awareness    Orientation Level: Disoriented to, Situation     Following Commands: Follows one step commands with increased time, Follows one step commands consistently Safety/Judgement: Decreased awareness of safety, Decreased awareness of deficits Awareness: Intellectual Problem Solving: Slow processing, Requires verbal cues, Requires tactile cues General Comments: Pt was alert. He refused author/PT session earlier but did agree to participate eventually. " I want to get in the recliner.        Exercises General Exercises - Lower Extremity Ankle Circles/Pumps: Strengthening, 10 reps Quad Sets: Strengthening, 10 reps Long Arc Quad: AROM, 10 reps, Seated Heel Slides: Seated, 10 reps Straight Leg Raises: AAROM, 10 reps, Supine    General Comments General comments (skin integrity, edema, etc.): pt performed both UE/LE ther ex while seated EOB      Pertinent Vitals/Pain Pain Assessment Pain Assessment: 0-10 Pain Score: 7  Pain Location: L knee Pain Descriptors / Indicators: Discomfort, Shooting, Sharp Pain Intervention(s): Limited activity within patient's  tolerance, Monitored during session, Repositioned     PT Goals (current goals can now be found in the care plan section) Acute Rehab PT Goals Patient Stated Goal: go home Progress towards PT goals: Progressing toward goals    Frequency    Min 2X/week      PT Plan Current plan remains appropriate    Co-evaluation     PT goals addressed during session: Mobility/safety with mobility;Balance;Strengthening/ROM        AM-PAC PT "6 Clicks" Mobility   Outcome Measure  Help needed turning from your back to your side while in a flat bed without using bedrails?: A Little Help needed moving from lying on your back to sitting on the side of a flat bed without using bedrails?: A Lot Help needed moving to and from a bed to a chair (including a wheelchair)?: Total Help needed standing up from a chair using your arms (e.g., wheelchair or bedside chair)?: Total Help needed to walk in hospital room?: Total Help needed climbing 3-5 steps with a railing? : Total 6 Click Score: 9    End of Session Equipment Utilized During Treatment: Oxygen (5 L trach collar throughout) Activity Tolerance: Patient tolerated treatment well Patient left: in chair;with call bell/phone within reach;with nursing/sitter in room Nurse Communication: Mobility status PT Visit Diagnosis: Unsteadiness on feet (R26.81);Muscle weakness (generalized) (M62.81);Difficulty in walking, not elsewhere classified (R26.2)     Time: 2355-7322 PT Time Calculation (min) (ACUTE ONLY): 24 min  Charges:  $Therapeutic Exercise: 8-22  mins $Therapeutic Activity: 8-22 mins                    Julaine Fusi PTA 12/22/21, 4:17 PM

## 2021-12-22 NOTE — Progress Notes (Signed)
PT Cancellation Note  Patient Details Name: Gary [name] MRN: [id number] DOB: 02/16/1956   Cancelled Treatment:     PT attempt. Pt requested author return at 1pm. Will return as requested.   [name] 12/22/2021, 10:16 AM

## 2021-12-22 NOTE — Progress Notes (Signed)
Patient was asked and encouraged to wear Trilogy during sleep but repeatedly stated he wasn't ready or to come back later. This RT tried to encourage and educate patient on importance of nocturnal vent but was told, "don't lecture me". Patient wore Trilogy Vent from approximately 0115 - 0600, with trach cuff deflated. Patient demanded to be taken off vent by 0600 or he would refuse to wear Trilogy any other night. Patient tolerated nocturnal vent well once placed.  Patient currently on 5L 28% TC at this time. PMV in place.Trilogy on standby at patient bedside.

## 2021-12-22 NOTE — Consult Note (Addendum)
PHARMACY CONSULT NOTE - FOLLOW UP  Pharmacy Consult for Electrolyte Monitoring and Replacement   Recent Labs: Potassium (mmol/L)  Date Value  12/21/2021 4.3  04/15/2014 3.9   Magnesium (mg/dL)  Date Value  12/21/2021 2.4  08/21/2012 1.9   Calcium (mg/dL)  Date Value  12/21/2021 8.4 (L)   Calcium, Total (mg/dL)  Date Value  04/15/2014 8.3 (L)   Albumin (g/dL)  Date Value  12/18/2021 3.1 (L)  04/15/2014 2.9 (L)   Phosphorus (mg/dL)  Date Value  12/21/2021 4.8 (H)   Sodium (mmol/L)  Date Value  12/21/2021 141  03/15/2020 145 (H)  04/15/2014 140     Assessment: Gary [name] is a 66 y.o. male presenting with worsening SOB x2 days. PMH significant for HFpEF, COPD, OSA, HTN, obesity. Plan to admit to ICU for acute on chronic hypoxic/hypercapnic respiratory failure requiring mechanical ventilatory support via chronic tracheostomy. Pharmacy has been consulted to monitor and replace electrolytes.   Medications: torsemide '20mg'$  QD PO (9/6>>  Goal of Therapy:  Electrolytes WNL  Plan:  Scr stable 1.1>1.12>1.35, however CO2 31>32>34 may represent start of contraction alkalosis. Body weight as low as 179kg 8/02 now 172.9kg on 9/08 [I/O Net -4.2L; UOP 0.5-0.35m/k/h]  Electrolytes remain WNL; no further repletion at this time. Continue to monitor electrolytes and replace PRN  Thank you for allowing pharmacy to be a part of this patient's care.  BLorna Dibble PharmD, BB[facility]Clinical Pharmacist 12/22/2021 7:48 AM

## 2021-12-22 NOTE — TOC Progression Note (Signed)
Transition of Care [facility]) - Progression Note    Patient Details  Name: Gary [name] MRN: [id number] Date of Birth: 1956/03/19  Transition of Care [facility]) CM/SW Contact  Shelbie Hutching, RN Phone Number: 12/22/2021, 1:35 PM  Clinical Narrative:     Patient tolerated wearing the Trilogy last night for 5 hours.  RNCM met with patient at the bedside and encouraged him to keep wearing the Trilogy at night while sleeping, patient says that he will do that. Reached out to [place] with [employer] to order Trilogy for home.  Thedore Mins will get in touch with their respiratory therapist to get set up.  Anticipates that patient could be set up and ready for DC by Tuesday.  A Family member will need to stay in the hospital overnight for the training.  [name] the significant other reports that she can stay Monday night.  Will ask [employer] to reach out to [place] and confirm.  Expected Discharge Plan: [place] Barriers to Discharge: Continued Medical Work up  Expected Discharge Plan and Services Expected Discharge Plan: [place]   Discharge Planning Services: CM Consult Post Acute Care Choice: [place] arrangements for the past 2 months: [place]: RN [place] Agency: [place] Date [place]: 12/20/21 Time [place]: 1030 Representative spoke with at [place]: Rob with [place] home care   Social Determinants of Health ([place]) Interventions    Readmission Risk Interventions    12/20/2021    4:21 PM 05/04/2021    1:26 PM 04/07/2021    1:39 PM  Readmission Risk Prevention Plan  Transportation Screening Complete Complete Complete  PCP or Specialist Appt within 3-5 Days   Complete  Palliative Care Screening   Not Applicable  Medication Review (RN Care Manager) Complete Complete Complete  PCP or Specialist appointment within 3-5 days of discharge Complete Complete   HRI or [place] Complete Complete   SW Recovery Care/Counseling Consult Complete Complete   Palliative Care Screening Complete Not [place] Not Applicable Not Applicable

## 2021-12-23 DIAGNOSIS — J9601 Acute respiratory failure with hypoxia: Secondary | ICD-10-CM | POA: Diagnosis not present

## 2021-12-23 DIAGNOSIS — I5033 Acute on chronic diastolic (congestive) heart failure: Secondary | ICD-10-CM | POA: Diagnosis not present

## 2021-12-23 DIAGNOSIS — Z6841 Body Mass Index (BMI) 40.0 and over, adult: Secondary | ICD-10-CM

## 2021-12-23 DIAGNOSIS — E662 Morbid (severe) obesity with alveolar hypoventilation: Secondary | ICD-10-CM | POA: Diagnosis not present

## 2021-12-23 DIAGNOSIS — J9602 Acute respiratory failure with hypercapnia: Secondary | ICD-10-CM

## 2021-12-23 LAB — CBC
HCT: 36.7 % — ABNORMAL LOW (ref 39.0–52.0)
Hemoglobin: 10 g/dL — ABNORMAL LOW (ref 13.0–17.0)
MCH: 22.4 pg — ABNORMAL LOW (ref 26.0–34.0)
MCHC: 27.2 g/dL — ABNORMAL LOW (ref 30.0–36.0)
MCV: 82.1 fL (ref 80.0–100.0)
Platelets: 218 10*3/uL (ref 150–400)
RBC: 4.47 MIL/uL (ref 4.22–5.81)
RDW: 17.9 % — ABNORMAL HIGH (ref 11.5–15.5)
WBC: 5.2 10*3/uL (ref 4.0–10.5)
nRBC: 0 % (ref 0.0–0.2)

## 2021-12-23 LAB — BASIC METABOLIC PANEL
Anion gap: 8 (ref 5–15)
BUN: 38 mg/dL — ABNORMAL HIGH (ref 8–23)
CO2: 33 mmol/L — ABNORMAL HIGH (ref 22–32)
Calcium: 8.4 mg/dL — ABNORMAL LOW (ref 8.9–10.3)
Chloride: 96 mmol/L — ABNORMAL LOW (ref 98–111)
Creatinine, Ser: 1.27 mg/dL — ABNORMAL HIGH (ref 0.61–1.24)
GFR, Estimated: >60 mL/min (ref >60)
Glucose, Bld: 138 mg/dL — ABNORMAL HIGH (ref 70–99)
Potassium: 4 mmol/L (ref 3.5–5.1)
Sodium: 137 mmol/L (ref 135–145)

## 2021-12-23 LAB — CULTURE, BLOOD (ROUTINE X 2): Culture: NO GROWTH

## 2021-12-23 LAB — PHOSPHORUS: Phosphorus: 3.3 mg/dL (ref 2.5–4.6)

## 2021-12-23 LAB — MAGNESIUM: Magnesium: 1.8 mg/dL (ref 1.7–2.4)

## 2021-12-23 NOTE — Progress Notes (Signed)
RT to patient bedside to re-attempt vent placement. Patient on the phone, 'come back later'

## 2021-12-23 NOTE — Progress Notes (Signed)
This nurse came to pt's room and siderails were down. Pt is adamant not putting it back up. Was given permission by AM nurse to put siderails down. Mats implemented.
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
## 2021-12-23 NOTE — Progress Notes (Signed)
Patient declined trach care at this time but was agreeable to be placed on trilogy nocturnal vent at this time.

## 2021-12-23 NOTE — Progress Notes (Addendum)
RT to patient bedside to reassess for vent placement and for scheduled breathing treatment. Patient found asleep, while on the phone. Patient declined nocturnal vent. Patient states, "dont ask me no more questions, I'm not going on no vent". Scheduled breathing treatment given, patient declined trach care or to take PMV off at this time.

## 2021-12-23 NOTE — Progress Notes (Signed)
PCCM FOLLOWING ALONG AS NEEDED NO ICU NEEDS AT THIS TIME
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
## 2021-12-23 NOTE — Progress Notes (Signed)
RT to patient bedside for scheduled breathing treatment. Patient found asleep with PMV off. Patient declined vent at this time, currently on TC 5L 28%. All trach care completed, breathing treatment given.

## 2021-12-24 DIAGNOSIS — J9622 Acute and chronic respiratory failure with hypercapnia: Secondary | ICD-10-CM | POA: Diagnosis not present

## 2021-12-24 DIAGNOSIS — J9621 Acute and chronic respiratory failure with hypoxia: Secondary | ICD-10-CM | POA: Diagnosis not present

## 2021-12-24 DIAGNOSIS — I5033 Acute on chronic diastolic (congestive) heart failure: Secondary | ICD-10-CM | POA: Diagnosis not present

## 2021-12-24 DIAGNOSIS — E662 Morbid (severe) obesity with alveolar hypoventilation: Secondary | ICD-10-CM | POA: Diagnosis not present

## 2021-12-24 LAB — CBC
HCT: 36.6 % — ABNORMAL LOW (ref 39.0–52.0)
Hemoglobin: 9.8 g/dL — ABNORMAL LOW (ref 13.0–17.0)
MCH: 21.9 pg — ABNORMAL LOW (ref 26.0–34.0)
MCHC: 26.8 g/dL — ABNORMAL LOW (ref 30.0–36.0)
MCV: 81.7 fL (ref 80.0–100.0)
Platelets: 225 10*3/uL (ref 150–400)
RBC: 4.48 MIL/uL (ref 4.22–5.81)
RDW: 17.9 % — ABNORMAL HIGH (ref 11.5–15.5)
WBC: 7.6 10*3/uL (ref 4.0–10.5)
nRBC: 0 % (ref 0.0–0.2)

## 2021-12-24 LAB — CULTURE, BLOOD (ROUTINE X 2)
Culture: NO GROWTH
Special Requests: ADEQUATE

## 2021-12-24 MED ORDER — IPRATROPIUM-ALBUTEROL 0.5-2.5 (3) MG/3ML IN SOLN
3.0000 mL | Freq: Three times a day (TID) | RESPIRATORY_TRACT | Status: DC
  Administered 2021-12-25: 3 mL via RESPIRATORY_TRACT
  Filled 2021-12-24: qty 3

## 2021-12-24 NOTE — Progress Notes (Signed)
RT to patient bedside for scheduled breathing treatment. Patient demanded treatment be stopped shortly after, treatment was stopped. Patient then demanded to come off of the vent, placed back on TC 5L 28%, suctioned, and PMV placed per patient request. Once PMV placed patient began swearing at this RT, stating he did not want to be on the vent. RN at bedside as well.
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
## 2021-12-24 NOTE — Progress Notes (Signed)
Trach care and suction of airway performed for this patient. Patient remains on trach collar.  Updated RN about non use of mechanical ventilation last couple of nights in icu and non use of therapy while on floor despite trilogy at bedside. Will continue to monitor.

## 2021-12-25 DIAGNOSIS — E662 Morbid (severe) obesity with alveolar hypoventilation: Secondary | ICD-10-CM | POA: Diagnosis not present

## 2021-12-25 DIAGNOSIS — I5033 Acute on chronic diastolic (congestive) heart failure: Secondary | ICD-10-CM | POA: Diagnosis not present

## 2021-12-25 DIAGNOSIS — F331 Major depressive disorder, recurrent, moderate: Secondary | ICD-10-CM

## 2021-12-25 DIAGNOSIS — J9621 Acute and chronic respiratory failure with hypoxia: Secondary | ICD-10-CM | POA: Diagnosis not present

## 2021-12-25 DIAGNOSIS — J9622 Acute and chronic respiratory failure with hypercapnia: Secondary | ICD-10-CM | POA: Diagnosis not present

## 2021-12-25 MED ORDER — IPRATROPIUM-ALBUTEROL 0.5-2.5 (3) MG/3ML IN SOLN
3.0000 mL | Freq: Two times a day (BID) | RESPIRATORY_TRACT | Status: DC
Start: 2021-12-25 — End: 2021-12-26
  Administered 2021-12-25 – 2021-12-26 (×2): 3 mL via RESPIRATORY_TRACT
  Filled 2021-12-25 (×2): qty 3

## 2021-12-25 NOTE — Progress Notes (Signed)
Physical Therapy Treatment Patient Details Name: Gary [name] MRN: [id number] DOB: [birth year] Today's Date: 12/25/2021   History of Present Illness Patient was recently discharged on 12/13/21 after a prolonged admission for respiratory failure for a total of 2 months. This was complicated by tracheostomy placement on 10/03/21. During this admission after being weaned from the ventilator to trach collar, the patient had episodes of hypercapnia, requiring ventilator support particularly at night. However, patient was non compliant and would refuse to wear the ventilator overnight to prevent hypercapnia. PCCM team recommended triology home vent at discharge, however due to inpatient noncompliance he was discharged home with home health support and DME including trach collar oxygen. Now admitted via EMS on 12/18/21 with complaints of worsening shortness of breath over the past 2 days.    PT Comments    Continues to require max encouragement for participation with session; agreeable only to bed-level activities.  Planned for/attempted transition to edge of bed, but patient adamantly refusing "you can make me do something I don't want to do".  Unable to reason with or redirect.  Will continue efforts in subsequent sessions as appropriate.     Recommendations for follow up therapy are one component of a multi-disciplinary discharge planning process, led by the attending physician.  Recommendations may be updated based on patient status, additional functional criteria and insurance authorization.  Follow Up Recommendations  Home health PT (patient refusing all other discharge options; recommend home with [facility]) Can patient physically be transported by private vehicle: No   Assistance Recommended at Discharge Frequent or constant Supervision/Assistance  Patient can return home with the following Two people to help with walking and/or transfers;Two people to help with  bathing/dressing/bathroom;Assistance with cooking/housework;Assist for transportation;Help with stairs or ramp for entrance   Equipment Recommendations       Recommendations for Other Services       Precautions / Restrictions Precautions Precautions: Fall Precaution Comments: cuffed trach     Mobility  Bed Mobility               General bed mobility comments: refused activity beyond bed-level activities    Transfers                   General transfer comment: refused activity beyond bed-level activities    Ambulation/Gait               General Gait Details: refused activity beyond bed-level activities   Stairs             Wheelchair Mobility    Modified Rankin (Stroke Patients Only)       Balance                                            Cognition Arousal/Alertness: Awake/alert Behavior During Therapy: WFL for tasks assessed/performed, Flat affect Overall Cognitive Status: Within Functional Limits for tasks assessed                                          Exercises Other Exercises Other Exercises: Supine UE/core therex, 1x10: diagonal reaching, modified head/trunk flexion; supine LE therex, 1x10, active ROM: ankle pumps, heel slides, hip abduct/adduct and SLR.  Act assist and constant cuing for full effort with supine therex  General Comments        Pertinent Vitals/Pain Pain Assessment Pain Assessment: No/denies pain    Home Living                          Prior Function            PT Goals (current goals can now be found in the care plan section) Acute Rehab PT Goals Patient Stated Goal: go home PT Goal Formulation: With patient Time For Goal Achievement: 01/03/22 Potential to Achieve Goals: Fair Progress towards PT goals: Progressing toward goals    Frequency    Min 2X/week      PT Plan Current plan remains appropriate    Co-evaluation               AM-PAC PT "6 Clicks" Mobility   Outcome Measure  Help needed turning from your back to your side while in a flat bed without using bedrails?: A Little Help needed moving from lying on your back to sitting on the side of a flat bed without using bedrails?: A Lot Help needed moving to and from a bed to a chair (including a wheelchair)?: Total Help needed standing up from a chair using your arms (e.g., wheelchair or bedside chair)?: Total Help needed to walk in hospital room?: Total Help needed climbing 3-5 steps with a railing? : Total 6 Click Score: 9    End of Session Equipment Utilized During Treatment: Oxygen Activity Tolerance: Patient tolerated treatment well Patient left: with call bell/phone within reach;in bed;with bed alarm set Nurse Communication: Mobility status PT Visit Diagnosis: Unsteadiness on feet (R26.81);Muscle weakness (generalized) (M62.81);Difficulty in walking, not elsewhere classified (R26.2)     Time: 1030-1056 PT Time Calculation (min) (ACUTE ONLY): 26 min  Charges:  $Therapeutic Exercise: 23-37 mins                     Belladonna Lubinski H. Owens Shark, PT, DPT, NCS 12/25/21, 4:34 PM [phone]

## 2021-12-25 NOTE — Progress Notes (Signed)
OT Cancellation Note  Patient Details Name: Gary [name] MRN: [id number] DOB: 07-31-1955   Cancelled Treatment:    Reason Eval/Treat Not Completed: Patient declined, no reason specified. OT order received and chart reviewed. Pt refusing OT evaluation this session. He reports working with PT and plans to go home tomorrow. OT will re-attempt as time allows.   Darleen Crocker, [place], OTR/L , CBIS ascom [phone]  12/25/21, 12:20 PM
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
## 2021-12-25 NOTE — TOC Progression Note (Addendum)
Transition of Care [facility]) - Progression Note    Patient Details  Name: Gary [name] MRN: [id number] Date of Birth: 1955/04/20  Transition of [facility]) CM/SW [place], LCSW Phone Number: 12/25/2021, 10:07 AM  Clinical Narrative:  Updated [employer] liaison.   12:30 pm: Per MD, potential discharge tomorrow. Left voicemail for Rob Medlin with [facility].  1:14 pm: Received call back from [place]. Provided update. MD said he notified patient's sister about discharge plan.  Expected Discharge Plan: [place] Barriers to Discharge: Continued Medical Work up  Expected Discharge Plan and Services Expected Discharge Plan: [place]   Discharge Planning Services: CM Consult Post Acute Care Choice: [place] arrangements for the past 2 months: [place]: RN [place] Agency: [place] Date [place]: 12/20/21 Time [place]: 1030 Representative spoke with at [place]: Rob with [place] home care   Social Determinants of Health ([place]) Interventions    Readmission Risk Interventions    12/20/2021    4:21 PM 05/04/2021    1:26 PM 04/07/2021    1:39 PM  Readmission Risk Prevention Plan  Transportation Screening Complete Complete Complete  PCP or Specialist Appt within 3-5 Days   Complete  Palliative Care Screening   Not Applicable  Medication Review (RN Care Manager) Complete Complete Complete  PCP or Specialist appointment within 3-5 days of discharge Complete Complete   HRI or [place] Complete Complete   SW Recovery Care/Counseling Consult Complete Complete   Palliative Care Screening Complete Not [place] Not Applicable Not Applicable
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
## 2021-12-26 ENCOUNTER — Other Ambulatory Visit: Payer: Medicaid Other | Admitting: Nurse Practitioner

## 2021-12-26 DIAGNOSIS — F1910 Other psychoactive substance abuse, uncomplicated: Secondary | ICD-10-CM | POA: Diagnosis not present

## 2021-12-26 DIAGNOSIS — J9601 Acute respiratory failure with hypoxia: Secondary | ICD-10-CM | POA: Diagnosis not present

## 2021-12-26 DIAGNOSIS — J9621 Acute and chronic respiratory failure with hypoxia: Secondary | ICD-10-CM | POA: Diagnosis not present

## 2021-12-26 DIAGNOSIS — J9602 Acute respiratory failure with hypercapnia: Secondary | ICD-10-CM | POA: Diagnosis not present

## 2021-12-26 NOTE — TOC Transition Note (Addendum)
Transition of Care [facility]) - CM/SW Discharge Note   Patient Details  Name: Gary [name] MRN: [id number] Date of Birth: 01-11-1956  Transition of Care [facility]) CM/SW Contact:  Alberteen Sam, LCSW Phone Number: 12/26/2021, 11:03 AM   Clinical Narrative:     CSW spoke with patient regarding dc today, requests EMS be called after lunch, confirmed there will be family at home to accept him via [facility]. Patient reports no questions or concerns, states having all dme at home. CSW has informed Rob with [facility] of patient's dc today. RN made aware of above, [facility] forms on chart. DC summary faxed to Rob at [phone]  Final next level of care: [place] Barriers to Discharge: No Barriers Identified   Patient Goals and CMS Choice Patient states their goals for this hospitalization and ongoing recovery are:: to go home CMS Medicare.gov Compare Post Acute Care list provided to:: Patient Choice offered to / list presented to : Patient  Discharge Placement                       Discharge Plan and Services   Discharge Planning Services: CM Consult Post Acute Care Choice: Home Health                    HH Arranged: RN [place]: [place] Date [place]: 12/20/21 Time [place]: 1030 Representative spoke with at [place]: Rob with [facility]  Social Determinants of Health ([place]) Interventions     Readmission Risk Interventions    12/20/2021    4:21 PM 05/04/2021    1:26 PM 04/07/2021    1:39 PM  Readmission Risk Prevention Plan  Transportation Screening Complete Complete Complete  PCP or Specialist Appt within 3-5 Days   Complete  Palliative Care Screening   Not Applicable  Medication Review (RN Care Manager) Complete Complete Complete  PCP or Specialist appointment within 3-5 days of discharge Complete Complete   HRI or [place] Complete Complete   SW Recovery Care/Counseling Consult Complete  Complete   Palliative Care Screening Complete Not [place] Not Applicable Not Applicable
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
## 2021-12-26 NOTE — Plan of Care (Signed)
  Problem: Activity: Goal: Ability to tolerate increased activity will improve Outcome: Adequate for Discharge   Problem: Respiratory: Goal: Ability to maintain a clear airway and adequate ventilation will improve Outcome: Adequate for Discharge   Problem: Role Relationship: Goal: Method of communication will improve Outcome: Adequate for Discharge   Problem: Education: Goal: Knowledge of General Education information will improve Description: Including pain rating scale, medication(s)/side effects and non-pharmacologic comfort measures Outcome: Adequate for Discharge   Problem: Health Behavior/Discharge Planning: Goal: Ability to manage health-related needs will improve Outcome: Adequate for Discharge   Problem: Clinical Measurements: Goal: Ability to maintain clinical measurements within normal limits will improve Outcome: Adequate for Discharge Goal: Will remain free from infection Outcome: Adequate for Discharge Goal: Diagnostic test results will improve Outcome: Adequate for Discharge Goal: Respiratory complications will improve Outcome: Adequate for Discharge Goal: Cardiovascular complication will be avoided Outcome: Adequate for Discharge   Problem: Activity: Goal: Risk for activity intolerance will decrease Outcome: Adequate for Discharge   Problem: Nutrition: Goal: Adequate nutrition will be maintained Outcome: Adequate for Discharge   Problem: Coping: Goal: Level of anxiety will decrease Outcome: Adequate for Discharge   Problem: Elimination: Goal: Will not experience complications related to bowel motility Outcome: Adequate for Discharge Goal: Will not experience complications related to urinary retention Outcome: Adequate for Discharge   Problem: Pain Managment: Goal: General experience of comfort will improve Outcome: Adequate for Discharge   Problem: Safety: Goal: Ability to remain free from injury will improve Outcome: Adequate for Discharge    Problem: Skin Integrity: Goal: Risk for impaired skin integrity will decrease Outcome: Adequate for Discharge

## 2021-12-27 ENCOUNTER — Other Ambulatory Visit: Payer: Medicaid Other | Admitting: Nurse Practitioner

## 2021-12-27 ENCOUNTER — Encounter (HOSPITAL_COMMUNITY): Payer: Self-pay

## 2021-12-27 ENCOUNTER — Telehealth (HOSPITAL_COMMUNITY): Payer: Self-pay

## 2021-12-27 DIAGNOSIS — R0602 Shortness of breath: Secondary | ICD-10-CM

## 2021-12-27 DIAGNOSIS — J449 Chronic obstructive pulmonary disease, unspecified: Secondary | ICD-10-CM

## 2021-12-27 DIAGNOSIS — Z515 Encounter for palliative care: Secondary | ICD-10-CM

## 2021-12-27 DIAGNOSIS — R5381 Other malaise: Secondary | ICD-10-CM

## 2021-12-27 LAB — BLOOD GAS, VENOUS
Acid-Base Excess: 7.1 mmol/L — ABNORMAL HIGH (ref 0.0–2.0)
Acid-Base Excess: 7.3 mmol/L — ABNORMAL HIGH (ref 0.0–2.0)
Bicarbonate: 38.5 mmol/L — ABNORMAL HIGH (ref 20.0–28.0)
Bicarbonate: 36.3 mmol/L — ABNORMAL HIGH (ref 20.0–28.0)
Mechanical Rate: 20
O2 Saturation: 66.4 %
O2 Saturation: 85.4 %
PEEP: 5 cmH2O
Patient temperature: 37
Patient temperature: 37
pCO2, Ven: 94 mmHg (ref 44–60)
pCO2, Ven: 72 mmHg (ref 44–60)
pH, Ven: 7.22 — ABNORMAL LOW (ref 7.25–7.43)
pH, Ven: 7.31 (ref 7.25–7.43)
pO2, Ven: 45 mmHg (ref 32–45)
pO2, Ven: 56 mmHg — ABNORMAL HIGH (ref 32–45)

## 2021-12-27 NOTE — Telephone Encounter (Signed)
Attempted to contact, no answer.  Left message to return my call and advised that a Palliative Care referral was made and they should be in contact soon.   [place] [phone]

## 2021-12-27 NOTE — Progress Notes (Signed)
Contacted [place] and [name] his girlfriend to see how he was doing.  He states doing ok but was told by [facility] they will not be coming out.  Contacted [place] and spoke to [facility] and he advised he has no staff willing to work with him.  He has 1 nurse that will come by next week for 2 days.  Contacted Scientist, research (physical sciences) at [facility] and advised to see what will can do to keep him at home.  She advised will take the information to staff to find out what is going on.    [place] [phone]

## 2021-12-27 NOTE — Progress Notes (Signed)
Was contacted by Christin from [place] and advised she spoke with Eddie Dibbles and he advised she did not need her if she could not help him getting home health.  Tyjay advised me the social worker from hospital said they could not help with services since he is discharged.  Contacted his PCP at [facility] and they advised will get a note to doctor to do a referral.  Gave him number to [facility] which he contacted but they are waiting for a referral.  He states he just needs help, his girlfriend can not do everything.  He is not wanting to go back to hospital.  Explained to him that a doctor will have to do a referral for services and that I am working on it but it takes time.  He understood, he states he just sees his self going back to hospital.  Explained to him if he needs to he should.  Will keep following up on this situation to try to keep him home.   [place] [phone]

## 2021-12-28 ENCOUNTER — Encounter (HOSPITAL_COMMUNITY): Payer: Self-pay

## 2021-12-28 ENCOUNTER — Encounter: Payer: Self-pay | Admitting: Nurse Practitioner
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
## 2021-12-28 NOTE — Progress Notes (Signed)
[facility] contacted me today and advised the doctor has not seen him and will not do a referral. Advised his situation again to staff and she will see if the doctor will do a televisit with him then a referral.  Without him receiving home health he will need to go back to hospital and he is aware.  Contacted [place] and advised his doctors office will be calling to do a televisit and he appeared to understand.  Advised him to let me know what is going on so I can assist in anything I can do.   [place] [phone]

## 2021-12-30 ENCOUNTER — Emergency Department: Payer: Medicaid Other

## 2021-12-30 ENCOUNTER — Inpatient Hospital Stay
Admission: EM | Admit: 2021-12-30 | Discharge: 2022-01-06 | DRG: 208 | Disposition: A | Discharge status: Home Health | Payer: Medicaid Other | Attending: Pulmonary Disease | Admitting: Pulmonary Disease

## 2021-12-30 ENCOUNTER — Other Ambulatory Visit: Payer: Self-pay

## 2021-12-30 ENCOUNTER — Inpatient Hospital Stay: Payer: Medicaid Other

## 2021-12-30 DIAGNOSIS — J9620 Acute and chronic respiratory failure, unspecified whether with hypoxia or hypercapnia: Secondary | ICD-10-CM | POA: Diagnosis present

## 2021-12-30 DIAGNOSIS — I5033 Acute on chronic diastolic (congestive) heart failure: Secondary | ICD-10-CM | POA: Diagnosis present

## 2021-12-30 DIAGNOSIS — Z91199 Patient's noncompliance with other medical treatment and regimen due to unspecified reason: Secondary | ICD-10-CM

## 2021-12-30 DIAGNOSIS — G9341 Metabolic encephalopathy: Secondary | ICD-10-CM | POA: Diagnosis present

## 2021-12-30 DIAGNOSIS — Z8711 Personal history of peptic ulcer disease: Secondary | ICD-10-CM

## 2021-12-30 DIAGNOSIS — Z8719 Personal history of other diseases of the digestive system: Secondary | ICD-10-CM | POA: Diagnosis not present

## 2021-12-30 DIAGNOSIS — D631 Anemia in chronic kidney disease: Secondary | ICD-10-CM | POA: Diagnosis present

## 2021-12-30 DIAGNOSIS — R778 Other specified abnormalities of plasma proteins: Secondary | ICD-10-CM | POA: Diagnosis present

## 2021-12-30 DIAGNOSIS — Z7901 Long term (current) use of anticoagulants: Secondary | ICD-10-CM | POA: Diagnosis not present

## 2021-12-30 DIAGNOSIS — A419 Sepsis, unspecified organism: Secondary | ICD-10-CM | POA: Diagnosis not present

## 2021-12-30 DIAGNOSIS — J9622 Acute and chronic respiratory failure with hypercapnia: Secondary | ICD-10-CM | POA: Diagnosis present

## 2021-12-30 DIAGNOSIS — Z6841 Body Mass Index (BMI) 40.0 and over, adult: Secondary | ICD-10-CM

## 2021-12-30 DIAGNOSIS — N179 Acute kidney failure, unspecified: Secondary | ICD-10-CM | POA: Diagnosis present

## 2021-12-30 DIAGNOSIS — E785 Hyperlipidemia, unspecified: Secondary | ICD-10-CM | POA: Diagnosis present

## 2021-12-30 DIAGNOSIS — Z79899 Other long term (current) drug therapy: Secondary | ICD-10-CM | POA: Diagnosis not present

## 2021-12-30 DIAGNOSIS — F329 Major depressive disorder, single episode, unspecified: Secondary | ICD-10-CM | POA: Diagnosis present

## 2021-12-30 DIAGNOSIS — E876 Hypokalemia: Secondary | ICD-10-CM | POA: Diagnosis not present

## 2021-12-30 DIAGNOSIS — I130 Hypertensive heart and chronic kidney disease with heart failure and stage 1 through stage 4 chronic kidney disease, or unspecified chronic kidney disease: Secondary | ICD-10-CM | POA: Diagnosis present

## 2021-12-30 DIAGNOSIS — Z87891 Personal history of nicotine dependence: Secondary | ICD-10-CM

## 2021-12-30 DIAGNOSIS — J9621 Acute and chronic respiratory failure with hypoxia: Secondary | ICD-10-CM | POA: Diagnosis present

## 2021-12-30 DIAGNOSIS — Z833 Family history of diabetes mellitus: Secondary | ICD-10-CM | POA: Diagnosis not present

## 2021-12-30 DIAGNOSIS — G928 Other toxic encephalopathy: Secondary | ICD-10-CM | POA: Diagnosis not present

## 2021-12-30 DIAGNOSIS — N1831 Chronic kidney disease, stage 3a: Secondary | ICD-10-CM | POA: Diagnosis present

## 2021-12-30 DIAGNOSIS — Z930 Tracheostomy status: Secondary | ICD-10-CM

## 2021-12-30 DIAGNOSIS — Z8616 Personal history of COVID-19: Secondary | ICD-10-CM | POA: Diagnosis not present

## 2021-12-30 DIAGNOSIS — J449 Chronic obstructive pulmonary disease, unspecified: Secondary | ICD-10-CM | POA: Diagnosis present

## 2021-12-30 DIAGNOSIS — E662 Morbid (severe) obesity with alveolar hypoventilation: Secondary | ICD-10-CM | POA: Diagnosis present

## 2021-12-30 DIAGNOSIS — J189 Pneumonia, unspecified organism: Secondary | ICD-10-CM

## 2021-12-30 DIAGNOSIS — E8729 Other acidosis: Secondary | ICD-10-CM | POA: Diagnosis present

## 2021-12-30 DIAGNOSIS — Z9981 Dependence on supplemental oxygen: Secondary | ICD-10-CM

## 2021-12-30 LAB — COMPREHENSIVE METABOLIC PANEL
ALT: 11 U/L (ref 0–44)
AST: 10 U/L — ABNORMAL LOW (ref 15–41)
Albumin: 3 g/dL — ABNORMAL LOW (ref 3.5–5.0)
Alkaline Phosphatase: 71 U/L (ref 38–126)
Anion gap: 9 (ref 5–15)
BUN: 35 mg/dL — ABNORMAL HIGH (ref 8–23)
CO2: 30 mmol/L (ref 22–32)
Calcium: 8.6 mg/dL — ABNORMAL LOW (ref 8.9–10.3)
Chloride: 102 mmol/L (ref 98–111)
Creatinine, Ser: 1.56 mg/dL — ABNORMAL HIGH (ref 0.61–1.24)
GFR, Estimated: 49 mL/min — ABNORMAL LOW (ref >60)
Glucose, Bld: 108 mg/dL — ABNORMAL HIGH (ref 70–99)
Potassium: 4.9 mmol/L (ref 3.5–5.1)
Sodium: 141 mmol/L (ref 135–145)
Total Bilirubin: 0.5 mg/dL (ref 0.3–1.2)
Total Protein: 7.5 g/dL (ref 6.5–8.1)

## 2021-12-30 LAB — URINALYSIS, COMPLETE (UACMP) WITH MICROSCOPIC
Bilirubin Urine: NEGATIVE
Glucose, UA: NEGATIVE mg/dL
Hgb urine dipstick: NEGATIVE
Ketones, ur: NEGATIVE mg/dL
Leukocytes,Ua: NEGATIVE
Nitrite: NEGATIVE
Protein, ur: NEGATIVE mg/dL
Specific Gravity, Urine: 1.016 (ref 1.005–1.030)
pH: 5 (ref 5.0–8.0)

## 2021-12-30 LAB — PROTIME-INR
INR: 1.2 (ref 0.8–1.2)
Prothrombin Time: 14.9 seconds (ref 11.4–15.2)

## 2021-12-30 LAB — CBC WITH DIFFERENTIAL/PLATELET
Abs Immature Granulocytes: 0.08 10*3/uL — ABNORMAL HIGH (ref 0.00–0.07)
Basophils Absolute: 0 10*3/uL (ref 0.0–0.1)
Basophils Relative: 0 %
Eosinophils Absolute: 0 10*3/uL (ref 0.0–0.5)
Eosinophils Relative: 0 %
HCT: 39.5 % (ref 39.0–52.0)
Hemoglobin: 9.9 g/dL — ABNORMAL LOW (ref 13.0–17.0)
Immature Granulocytes: 1 %
Lymphocytes Relative: 8 %
Lymphs Abs: 0.9 10*3/uL (ref 0.7–4.0)
MCH: 21.5 pg — ABNORMAL LOW (ref 26.0–34.0)
MCHC: 25.1 g/dL — ABNORMAL LOW (ref 30.0–36.0)
MCV: 85.7 fL (ref 80.0–100.0)
Monocytes Absolute: 1 10*3/uL (ref 0.1–1.0)
Monocytes Relative: 9 %
Neutro Abs: 9 10*3/uL — ABNORMAL HIGH (ref 1.7–7.7)
Neutrophils Relative %: 82 %
Platelets: 263 10*3/uL (ref 150–400)
RBC: 4.61 MIL/uL (ref 4.22–5.81)
RDW: 18.1 % — ABNORMAL HIGH (ref 11.5–15.5)
WBC: 11 10*3/uL — ABNORMAL HIGH (ref 4.0–10.5)
nRBC: 1.6 % — ABNORMAL HIGH (ref 0.0–0.2)

## 2021-12-30 LAB — BLOOD GAS, VENOUS
Acid-Base Excess: 7.7 mmol/L — ABNORMAL HIGH (ref 0.0–2.0)
Bicarbonate: 37.6 mmol/L — ABNORMAL HIGH (ref 20.0–28.0)
FIO2: 60 %
MECHVT: 550 mL
Mechanical Rate: 18
O2 Saturation: 39.7 %
PEEP: 8 cmH2O
Patient temperature: 37
pCO2, Ven: 80 mmHg (ref 44–60)
pH, Ven: 7.28 (ref 7.25–7.43)
pO2, Ven: <31 mmHg — CL (ref 32–45)

## 2021-12-30 LAB — RESP PANEL BY RT-PCR (FLU A&B, COVID) ARPGX2
Influenza A by PCR: NEGATIVE
Influenza B by PCR: NEGATIVE
SARS Coronavirus 2 by RT PCR: NEGATIVE

## 2021-12-30 LAB — APTT: aPTT: 31 seconds (ref 24–36)

## 2021-12-30 LAB — LACTIC ACID, PLASMA: Lactic Acid, Venous: 1.6 mmol/L (ref 0.5–1.9)

## 2021-12-30 LAB — TROPONIN I (HIGH SENSITIVITY): Troponin I (High Sensitivity): 22 ng/L — ABNORMAL HIGH (ref <18)

## 2021-12-30 LAB — PROCALCITONIN: Procalcitonin: 0.42 ng/mL

## 2021-12-30 MED ORDER — IPRATROPIUM-ALBUTEROL 0.5-2.5 (3) MG/3ML IN SOLN
3.0000 mL | Freq: Four times a day (QID) | RESPIRATORY_TRACT | Status: DC
  Administered 2021-12-31 – 2022-01-01 (×7): 3 mL via RESPIRATORY_TRACT
  Filled 2021-12-30 (×7): qty 3

## 2021-12-30 MED ORDER — DOCUSATE SODIUM 100 MG PO CAPS: 100.0000 mg | ORAL_CAPSULE | Freq: Two times a day (BID) | ORAL | Status: DC | PRN

## 2021-12-30 MED ORDER — PANTOPRAZOLE SODIUM 40 MG IV SOLR
40.0000 mg | INTRAVENOUS | Status: DC
  Administered 2021-12-31 – 2022-01-01 (×3): 40 mg via INTRAVENOUS
  Filled 2021-12-30 (×3): qty 10

## 2021-12-30 MED ORDER — VANCOMYCIN HCL IN DEXTROSE 1-5 GM/200ML-% IV SOLN
1000.0000 mg | Freq: Once | INTRAVENOUS | Status: AC
  Administered 2021-12-30: 1000 mg via INTRAVENOUS

## 2021-12-30 MED ORDER — VANCOMYCIN HCL 1500 MG/300ML IV SOLN
1500.0000 mg | Freq: Once | INTRAVENOUS | Status: AC
  Administered 2021-12-30: 1500 mg via INTRAVENOUS
  Filled 2021-12-30 (×2): qty 300

## 2021-12-30 MED ORDER — APIXABAN 2.5 MG PO TABS: 2.5000 mg | ORAL_TABLET | Freq: Two times a day (BID) | ORAL | Status: DC

## 2021-12-30 MED ORDER — VANCOMYCIN HCL IN DEXTROSE 1-5 GM/200ML-% IV SOLN
1000.0000 mg | Freq: Once | INTRAVENOUS | Status: DC
  Filled 2021-12-30: qty 200

## 2021-12-30 MED ORDER — ACETAMINOPHEN 650 MG RE SUPP
650.0000 mg | Freq: Once | RECTAL | Status: DC
  Filled 2021-12-30: qty 2

## 2021-12-30 MED ORDER — BUDESONIDE 0.25 MG/2ML IN SUSP
0.2500 mg | Freq: Two times a day (BID) | RESPIRATORY_TRACT | Status: DC
  Administered 2021-12-31: 0.25 mg via RESPIRATORY_TRACT
  Filled 2021-12-30: qty 2

## 2021-12-30 MED ORDER — METRONIDAZOLE 500 MG/100ML IV SOLN
500.0000 mg | Freq: Once | INTRAVENOUS | Status: AC
  Administered 2021-12-30: 500 mg via INTRAVENOUS
  Filled 2021-12-30: qty 100

## 2021-12-30 MED ORDER — SODIUM CHLORIDE 0.9 % IV SOLN
2.0000 g | Freq: Once | INTRAVENOUS | Status: AC
  Administered 2021-12-30: 2 g via INTRAVENOUS
  Filled 2021-12-30: qty 12.5

## 2021-12-30 MED ORDER — POLYETHYLENE GLYCOL 3350 17 G PO PACK: 17.0000 g | PACK | Freq: Every day | ORAL | Status: DC | PRN

## 2021-12-30 NOTE — Sepsis Progress Note (Signed)
Elink following code sepsis °

## 2021-12-30 NOTE — Progress Notes (Signed)
PHARMACY -  BRIEF ANTIBIOTIC NOTE   Pharmacy has received consult(s) for vancomycin and cefepime from an ED provider.  The patient's profile has been reviewed for ht/wt/allergies/indication/available labs.    One time order(s) placed for vancomycin 1,000 mg x 1 and cefepime 2 grams x 1  Further antibiotics/pharmacy consults should be ordered by admitting physician if indicated.                       Thank you,  Glean Salvo, PharmD Clinical Pharmacist  12/30/2021 9:39 PM

## 2021-12-30 NOTE — ED Provider Notes (Signed)
[facility] Provider Note    Event Date/Time   First MD Initiated Contact with Patient 12/30/21 2132     (approximate)   History   No chief complaint on file.   HPI  Gary [name] is a 66 y.o. male with complex medical history including a history of intubation back in June with tracheostomy placement in June 20 who comes in with concerns for worsening shortness of breath.  Patient noted to be febrile and hypoxic in the 50s on his normal oxygen at home.  Per EMS he did require bagging as well due to decreased respiration on route but he never lost pulses.  They were concerned of a fever of 100.5.  No other medications were given.  Unable to get full history from patient due to altered mental status.  Physical Exam   Triage Vital Signs: Blood pressure 100/82, pulse 90, temperature 99.7 F (37.6 C), temperature source Axillary, resp. rate 20, SpO2 99 %.  Most recent vital signs: Vitals:   12/30/21 2137 12/30/21 2157  BP:  100/82  Pulse:  90  Resp:  20  Temp:    SpO2: 99% 99%     General: Patient seems somewhat sleepy. CV:  Good peripheral perfusion.  Resp:  Normal effort.  Abd:  No distention.  Other:  Patient is wiggling his bilateral toes.  He is withdrawing to pain. Trach noted in neck  ED Results / Procedures / Treatments   Labs (all labs ordered are listed, but only abnormal results are displayed) Labs Reviewed  COMPREHENSIVE METABOLIC PANEL - Abnormal; Notable for the following components:      Result Value   Glucose, Bld 108 (*)    BUN 35 (*)    Creatinine, Ser 1.56 (*)    Calcium 8.6 (*)    Albumin 3.0 (*)    AST 10 (*)    GFR, Estimated 49 (*)    All other components within normal limits  CBC WITH DIFFERENTIAL/PLATELET - Abnormal; Notable for the following components:   WBC 11.0 (*)    Hemoglobin 9.9 (*)    MCH 21.5 (*)    MCHC 25.1 (*)    RDW 18.1 (*)    nRBC 1.6 (*)    Neutro Abs 9.0 (*)    Abs Immature Granulocytes  0.08 (*)    All other components within normal limits  URINALYSIS, COMPLETE (UACMP) WITH MICROSCOPIC - Abnormal; Notable for the following components:   Color, Urine YELLOW (*)    APPearance HAZY (*)    Bacteria, UA RARE (*)    All other components within normal limits  BLOOD GAS, VENOUS - Abnormal; Notable for the following components:   pCO2, Ven 80 (*)    pO2, Ven <31 (*)    Bicarbonate 37.6 (*)    Acid-Base Excess 7.7 (*)    All other components within normal limits  TROPONIN I (HIGH SENSITIVITY) - Abnormal; Notable for the following components:   Troponin I (High Sensitivity) 22 (*)    All other components within normal limits  RESP PANEL BY RT-PCR (FLU A&B, COVID) ARPGX2  CULTURE, BLOOD (ROUTINE X 2)  CULTURE, BLOOD (ROUTINE X 2)  URINE CULTURE  LACTIC ACID, PLASMA  PROTIME-INR  APTT  PROCALCITONIN  BRAIN NATRIURETIC PEPTIDE  PROCALCITONIN  CBC  BASIC METABOLIC PANEL  BLOOD GAS, ARTERIAL  MAGNESIUM  PHOSPHORUS  TROPONIN I (HIGH SENSITIVITY)     EKG  My interpretation of EKG:  Sinus tachycardia rate of 107  without any ST elevation or T wave inversions, normal intervals  RADIOLOGY I have reviewed the xray personally and interpreted and concern for possible pneumonia  PROCEDURES:  Critical Care performed: Yes, see critical care procedure note(s)  .1-3 Lead EKG Interpretation  Performed by: Vanessa [place], MD Authorized by: Vanessa [place], MD     Interpretation: abnormal     ECG rate:  107   ECG rate assessment: tachycardic     Rhythm: sinus tachycardia     Ectopy: none     Conduction: normal   .Critical Care  Performed by: Vanessa [place], MD Authorized by: Vanessa [place], MD   Critical care provider statement:    Critical care time (minutes):  30   Critical care was necessary to treat or prevent imminent or life-threatening deterioration of the following conditions:  Respiratory failure   Critical care was time spent personally by me on the following  activities:  Development of treatment plan with patient or surrogate, discussions with consultants, evaluation of patient's response to treatment, examination of patient, ordering and review of laboratory studies, ordering and review of radiographic studies, ordering and performing treatments and interventions, pulse oximetry, re-evaluation of patient's condition and review of [facility]: Medications  ceFEPIme (MAXIPIME) 2 g in sodium chloride 0.9 % 100 mL IVPB (has no administration in time range)  metroNIDAZOLE (FLAGYL) IVPB 500 mg (has no administration in time range)  vancomycin (VANCOCIN) IVPB 1000 mg/200 mL premix (has no administration in time range)  acetaminophen (TYLENOL) suppository 650 mg (has no administration in time range)     IMPRESSION / MDM / [place] / ED COURSE  I reviewed the triage vital signs and the nursing notes.   Patient's presentation is most consistent with acute presentation with potential threat to life or bodily function.   Patient comes in febrile respiratory distress.  Patient seems more altered suspect secondary to hypercapnia.  Patient was initially placed on a ventilator due to my significant concern and patient.  Given the reported fever blood cultures were ordered and broad-spectrum antibiotics were ordered.  Patient does have some history of vascular congestion so we will hold off on fluids initially until I see a chest x-ray to make sure there is no evidence of pulmonary edema.  Broad-spectrum work-up was initiated  COVID test was negative.  Troponin was slightly elevating.  Lactate is normal.  White count slightly elevated.  Broad-spectrum antibiotics have been started I discussed the ICU for admission    FINAL CLINICAL IMPRESSION(S) / ED DIAGNOSES   Final diagnoses:  Sepsis, due to unspecified organism, unspecified whether acute organ dysfunction present ([place])  Pneumonia due to infectious organism,  unspecified laterality, unspecified part of lung     Rx / DC Orders   ED Discharge Orders     None        Note:  This document was prepared using Dragon voice recognition software and may include unintentional dictation errors.   Vanessa [place], MD 12/30/21 2329

## 2021-12-30 NOTE — ED Triage Notes (Signed)
Pt coming from home for SOB beginning today. Hx of noncompliance. Pt has trach with O2 connected. Family reports O2 in 56s, Upon EMS arrival O2 in 16s. EMS reports patient got up to 90s en route on 8L. Pt then began to desat and respiratory rate dropped below 6 breaths/min. Pt was bagged briefly en route. No loss of pulse.

## 2021-12-30 NOTE — ED Notes (Signed)
ATTEMPT IV INSERTION X1 WITHOUT SUCCESS
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
## 2021-12-30 NOTE — ED Notes (Signed)
Handoff given to Rolland Porter, RN. Opportunity given to ask questions.

## 2021-12-30 NOTE — Progress Notes (Signed)
CODE SEPSIS - PHARMACY COMMUNICATION  **Broad Spectrum Antibiotics should be administered within 1 hour of Sepsis diagnosis**  Time Code Sepsis Called/Page Received:  9/16 @ 2135   Antibiotics Ordered: Vanc, Cefepime , metronidazole   Time of 1st antibiotic administration:  Cefepime 2 gm IV X 1 on 9/16 @ 2135  Additional action taken by pharmacy:   If necessary, Name of Provider/Nurse Contacted:     Ahtziry Saathoff D ,PharmD Clinical Pharmacist  12/30/2021  10:18 PM

## 2021-12-31 DIAGNOSIS — J9621 Acute and chronic respiratory failure with hypoxia: Secondary | ICD-10-CM

## 2021-12-31 DIAGNOSIS — G928 Other toxic encephalopathy: Secondary | ICD-10-CM

## 2021-12-31 DIAGNOSIS — J9622 Acute and chronic respiratory failure with hypercapnia: Secondary | ICD-10-CM

## 2021-12-31 LAB — CBC
HCT: 35.8 % — ABNORMAL LOW (ref 39.0–52.0)
Hemoglobin: 9.6 g/dL — ABNORMAL LOW (ref 13.0–17.0)
MCH: 21.8 pg — ABNORMAL LOW (ref 26.0–34.0)
MCHC: 26.8 g/dL — ABNORMAL LOW (ref 30.0–36.0)
MCV: 81.4 fL (ref 80.0–100.0)
Platelets: 177 10*3/uL (ref 150–400)
RBC: 4.4 MIL/uL (ref 4.22–5.81)
RDW: 18.2 % — ABNORMAL HIGH (ref 11.5–15.5)
WBC: 11.2 10*3/uL — ABNORMAL HIGH (ref 4.0–10.5)
nRBC: 1.4 % — ABNORMAL HIGH (ref 0.0–0.2)

## 2021-12-31 LAB — GLUCOSE, CAPILLARY
Glucose-Capillary: 89 mg/dL (ref 70–99)
Glucose-Capillary: 99 mg/dL (ref 70–99)
Glucose-Capillary: 82 mg/dL (ref 70–99)
Glucose-Capillary: 92 mg/dL (ref 70–99)

## 2021-12-31 LAB — BASIC METABOLIC PANEL
Anion gap: 8 (ref 5–15)
BUN: 38 mg/dL — ABNORMAL HIGH (ref 8–23)
CO2: 28 mmol/L (ref 22–32)
Calcium: 8.7 mg/dL — ABNORMAL LOW (ref 8.9–10.3)
Chloride: 104 mmol/L (ref 98–111)
Creatinine, Ser: 1.44 mg/dL — ABNORMAL HIGH (ref 0.61–1.24)
GFR, Estimated: 54 mL/min — ABNORMAL LOW (ref >60)
Glucose, Bld: 90 mg/dL (ref 70–99)
Potassium: 4 mmol/L (ref 3.5–5.1)
Sodium: 140 mmol/L (ref 135–145)

## 2021-12-31 LAB — BLOOD GAS, VENOUS
Acid-Base Excess: 6.9 mmol/L — ABNORMAL HIGH (ref 0.0–2.0)
Bicarbonate: 34.1 mmol/L — ABNORMAL HIGH (ref 20.0–28.0)
O2 Saturation: 63.5 %
Patient temperature: 37
pCO2, Ven: 59 mmHg (ref 44–60)
pH, Ven: 7.37 (ref 7.25–7.43)
pO2, Ven: 38 mmHg (ref 32–45)

## 2021-12-31 LAB — PHOSPHORUS: Phosphorus: 2.5 mg/dL (ref 2.5–4.6)

## 2021-12-31 LAB — PROCALCITONIN: Procalcitonin: 0.5 ng/mL

## 2021-12-31 LAB — TROPONIN I (HIGH SENSITIVITY): Troponin I (High Sensitivity): 26 ng/L — ABNORMAL HIGH (ref <18)

## 2021-12-31 LAB — BRAIN NATRIURETIC PEPTIDE: B Natriuretic Peptide: 326.2 pg/mL — ABNORMAL HIGH (ref 0.0–100.0)

## 2021-12-31 LAB — MAGNESIUM: Magnesium: 1.9 mg/dL (ref 1.7–2.4)

## 2021-12-31 MED ORDER — CHLORHEXIDINE GLUCONATE CLOTH 2 % EX PADS
6.0000 | MEDICATED_PAD | Freq: Every day | CUTANEOUS | Status: DC
  Administered 2022-01-01 – 2022-01-06 (×6): 6 via TOPICAL

## 2021-12-31 MED ORDER — ORAL CARE MOUTH RINSE: 15.0000 mL | OROMUCOSAL | Status: DC | PRN

## 2021-12-31 MED ORDER — FUROSEMIDE 10 MG/ML IJ SOLN
20.0000 mg | Freq: Every day | INTRAMUSCULAR | Status: DC
  Administered 2021-12-31 – 2022-01-01 (×2): 20 mg via INTRAVENOUS
  Filled 2021-12-31 (×2): qty 2

## 2021-12-31 MED ORDER — ORAL CARE MOUTH RINSE
15.0000 mL | OROMUCOSAL | Status: DC
  Administered 2021-12-31 – 2022-01-01 (×6): 15 mL via OROMUCOSAL

## 2021-12-31 MED ORDER — SODIUM CHLORIDE 0.9 % IV SOLN: INTRAVENOUS | Status: DC | PRN

## 2021-12-31 MED ORDER — HEPARIN SODIUM (PORCINE) 5000 UNIT/ML IJ SOLN
5000.0000 [IU] | Freq: Three times a day (TID) | INTRAMUSCULAR | Status: DC
  Administered 2021-12-31 – 2022-01-02 (×8): 5000 [IU] via SUBCUTANEOUS
  Filled 2021-12-31 (×8): qty 1

## 2021-12-31 MED ORDER — SODIUM CHLORIDE 0.9 % IV SOLN
2.0000 g | Freq: Three times a day (TID) | INTRAVENOUS | Status: DC
  Administered 2021-12-31: 2 g via INTRAVENOUS
  Filled 2021-12-31 (×2): qty 12.5

## 2021-12-31 NOTE — Progress Notes (Signed)
Pharmacy Antibiotic Note  Gary [name] is a 66 y.o. male admitted on 12/30/2021 with HCAP.  Pharmacy has been consulted for Cefepime dosing.  Plan: Cefepime 2 gm IV X 1 given on 9/16 @ 2210. Cefepime 2 gm IV Q8H ordered to start on 9/17 @ 0600.      Temp (24hrs), Avg:99.7 F (37.6 C), Min:99.7 F (37.6 C), Max:99.7 F (37.6 C)  Recent Labs  Lab 12/24/21 0417 12/30/21 2146  WBC 7.6 11.0*  CREATININE  --  1.56*  LATICACIDVEN  --  1.6    Estimated Creatinine Clearance: 81.1 mL/min (A) (by C-G formula based on SCr of 1.56 mg/dL (H)).    No Known Allergies  Antimicrobials this admission:   >>    >>   Dose adjustments this admission:   Microbiology results:  BCx:   UCx:    Sputum:    MRSA PCR:   Thank you for allowing pharmacy to be a part of this patient's care.  Shatasha Lambing D 12/31/2021 12:41 AM

## 2021-12-31 NOTE — TOC Initial Note (Signed)
Transition of Care [facility]) - Initial/Assessment Note    Patient Details  Name: Gary [name] MRN: [id number] Date of Birth: 09/27/1955  [facility]) CM/SW Contact:    Magnus Ivan, LCSW Phone Number: 12/31/2021, 8:59 AM  Clinical Narrative:                 Patient known to [facility] for recent admissions. Patient is from home with significant other. Patient has DME at home including hospital bed, oxygen, wheelchair, bedside commode, walker, and hoyer lift. Patient receives home nursing services through [facility].  TOC will follow for additional needs.   Expected Discharge Plan: [place] Barriers to Discharge: Continued Medical Work up   Patient Goals and CMS Choice        Expected Discharge Plan and Services Expected Discharge Plan: [place]       Living arrangements for the past 2 months: Single Family Home                                      Prior Living Arrangements/Services Living arrangements for the past 2 months: Single Family Home Lives with:: Significant Other              Current home services: DME, Home RN    Activities of Daily Living      Permission Sought/Granted                  Emotional Assessment              Admission diagnosis:  Acute on chronic respiratory failure ([name]) [J96.20] Pneumonia due to infectious organism, unspecified laterality, unspecified part of lung [J18.9] Sepsis, due to unspecified organism, unspecified whether acute organ dysfunction present [facility]) [A41.9] Patient Active Problem List   Diagnosis Date Noted   Hematochezia    Bloody stool 11/12/2021   Major depressive disorder, recurrent episode, moderate ([place]) 10/22/2021   Pressure injury of skin 09/27/2021   Acute on chronic respiratory failure with hypercapnia ([place]) 08/07/2021   COPD (chronic obstructive pulmonary disease) ([name])    HLD (hyperlipidemia)    Iron deficiency anemia    Chronic kidney  disease, stage 3a ([place]) 07/22/2021   Acute cardiac pulmonary edema ([place]) 07/22/2021   Acute on chronic respiratory failure with hypoxia and hypercapnia ([place]) 07/20/2021   Chronic obstructive pulmonary disease ([name])    Positive D dimer    CHF exacerbation ([place]) 05/03/2021   Acute CHF (congestive heart failure) ([place]) 04/04/2021   Weakness    Pneumonia 03/11/2021   Acute respiratory failure with hypoxia and hypercapnia ([place]) 03/11/2021   (HFpEF) heart failure with preserved ejection fraction ([name])    Chronic respiratory failure with hypoxia ([place])    Hyperlipidemia 02/19/2021   Acute respiratory distress syndrome (ARDS) due to COVID-19 virus ([place])    Acute on chronic respiratory failure ([place]) 02/18/2021   Generalized osteoarthritis of multiple sites 10/31/2020   AKI (acute kidney injury) ([place]) 03/04/2020   Hyperkalemia 03/04/2020   COPD with acute exacerbation ([place]) 03/02/2020   Marijuana use 01/17/2020   Thrombocytopenia ([place]) 01/17/2020   Positive hepatitis C antibody test 01/17/2020   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 mass index (BMI) of 50.0 to 59.9 in adult ([place]) 01/17/2020   Osteoarthritis of glenohumeral joints (Bilateral) 01/17/2020   Osteoarthritis of AC (acromioclavicular) joints (Bilateral) 01/17/2020   Polysubstance abuse ([place]) 08/25/2019  Toxic metabolic encephalopathy 61/44/3154   Chronic pain syndrome 07/14/2019   DDD (degenerative disc disease), lumbosacral 07/14/2019   DDD (degenerative disc disease), cervical 07/14/2019   CHF (congestive heart failure) ([place]) 04/12/2017   Acute on chronic diastolic CHF (congestive heart failure) ([place]) 02/28/2015   Obstructive sleep apnea 02/28/2015   Acute on chronic diastolic congestive heart failure ([place])    Acute on chronic respiratory failure with hypoxia ([place])    Morbid obesity ([place]) 04/01/2014   HTN (hypertension) 04/01/2014   PCP:  [facility], [place]:   [place],  [place] [address] [place] [address] Phone: [phone] Fax: [phone]     Social Determinants of Health (SDOH) Interventions    Readmission Risk Interventions    12/31/2021    8:58 AM 12/20/2021    4:21 PM 05/04/2021    1:26 PM  Readmission Risk Prevention Plan  Transportation Screening Complete Complete Complete  Medication Review (RN Care Manager) Complete Complete Complete  PCP or Specialist appointment within 3-5 days of discharge Complete Complete Complete  HRI or Home Care Consult Complete Complete Complete  SW Recovery Care/Counseling Consult Complete Complete Complete  Palliative Care Screening Complete Complete Not [place] Not Applicable Not Applicable Not Applicable

## 2021-12-31 NOTE — Progress Notes (Signed)
SLP Cancellation Note  Patient Details Name: Gary [name] MRN: [id number] DOB: [birth year]   Cancelled treatment:       Reason Eval/Treat Not Completed: Other (comment) Orders received for speaking valve assessment and chart review completed. Evaluation not completed secondary to hospital policy/new employee competency completion. SLP spoke with pt given report of frustration with process. Therapist informed pt of plan to follow up with priority next date. SLP will follow up as able.    [place] Shabreka Coulon Clapp  MS CCC-SLP   [place] J Clapp 12/31/2021, 6:06 PM

## 2021-12-31 NOTE — Consult Note (Signed)
PHARMACY CONSULT NOTE - FOLLOW UP  Pharmacy Consult for Electrolyte Monitoring and Replacement   Recent Labs: Potassium (mmol/L)  Date Value  12/31/2021 4.0  04/15/2014 3.9   Magnesium (mg/dL)  Date Value  12/31/2021 1.9  08/21/2012 1.9   Calcium (mg/dL)  Date Value  12/31/2021 8.7 (L)   Calcium, Total (mg/dL)  Date Value  04/15/2014 8.3 (L)   Albumin (g/dL)  Date Value  12/30/2021 3.0 (L)  04/15/2014 2.9 (L)   Phosphorus (mg/dL)  Date Value  12/31/2021 2.5   Sodium (mmol/L)  Date Value  12/31/2021 140  03/15/2020 145 (H)  04/15/2014 140     Assessment: 66 yo M with a chronic tracheostomy presenting to [facility] ED from home via EMS on 12/30/21 for evaluation of hypoxia.  Goal of Therapy:  WNL  Plan:  No replacement needed at this time.  F/u with AM labs.   Oswald Hillock ,PharmD Clinical Pharmacist 12/31/2021 7:03 AM
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
## 2021-12-31 NOTE — Progress Notes (Signed)
Patient insisting that he have his passey muir valve placed, and requested that his family be allowed to bring it here. Pt informed that he has to be evaluated prior to it being used. Pt stated "bullshit"; very angry and accused this nurse of not looking after him. Pt then seen by speech, and unable to clear him to wear valve today. Pt continues to ask family to bring his valve.

## 2021-12-31 NOTE — Plan of Care (Signed)
More awake this afternoon, weaned off vent to trach collar. Wants to use passey-muir valve but unable to be cleared today by speech. Allowed ice chips. Tolerating fairly well, does have large amount secretions. Irritable this afternoon at not being able to eat and drink, used profanity, and wife also very angry at this RN for now allowing food and drink. Neither believes he should be restricted, although this was explained to them by myself and also charge nurse that this is for safety due to risk of aspiration. Dr. Genia Harold in to speak with patient and patient has agreed not to be abusive to the staff.   Problem: Activity: Goal: Ability to tolerate increased activity will improve Outcome: Progressing   Problem: Respiratory: Goal: Ability to maintain a clear airway and adequate ventilation will improve Outcome: Progressing   Problem: Role Relationship: Goal: Method of communication will improve Outcome: Progressing   Problem: Education: Goal: Knowledge of General Education information will improve Description: Including pain rating scale, medication(s)/side effects and non-pharmacologic comfort measures Outcome: Progressing   Problem: Health Behavior/Discharge Planning: Goal: Ability to manage health-related needs will improve Outcome: Progressing   Problem: Clinical Measurements: Goal: Ability to maintain clinical measurements within normal limits will improve Outcome: Progressing Goal: Will remain free from infection Outcome: Progressing Goal: Diagnostic test results will improve Outcome: Progressing Goal: Respiratory complications will improve Outcome: Progressing Goal: Cardiovascular complication will be avoided Outcome: Progressing   Problem: Activity: Goal: Risk for activity intolerance will decrease Outcome: Progressing   Problem: Nutrition: Goal: Adequate nutrition will be maintained Outcome: Progressing   Problem: Coping: Goal: Level of anxiety will  decrease Outcome: Progressing   Problem: Elimination: Goal: Will not experience complications related to bowel motility Outcome: Progressing Goal: Will not experience complications related to urinary retention Outcome: Progressing   Problem: Pain Managment: Goal: General experience of comfort will improve Outcome: Progressing   Problem: Safety: Goal: Ability to remain free from injury will improve Outcome: Progressing   Problem: Skin Integrity: Goal: Risk for impaired skin integrity will decrease Outcome: Progressing

## 2021-12-31 NOTE — Progress Notes (Signed)
Pt refused to have his blood sugar check. NP Huel Cote [place] made aware

## 2021-12-31 NOTE — Progress Notes (Signed)
Dr. Genia Harold in to see patient; has given approval for patient to have ice chips.

## 2022-01-01 ENCOUNTER — Encounter: Payer: Self-pay | Admitting: Student in an Organized Health Care Education/Training Program

## 2022-01-01 ENCOUNTER — Other Ambulatory Visit: Payer: Self-pay

## 2022-01-01 DIAGNOSIS — J189 Pneumonia, unspecified organism: Secondary | ICD-10-CM | POA: Diagnosis not present

## 2022-01-01 DIAGNOSIS — N179 Acute kidney failure, unspecified: Secondary | ICD-10-CM

## 2022-01-01 LAB — BASIC METABOLIC PANEL
Anion gap: 6 (ref 5–15)
BUN: 35 mg/dL — ABNORMAL HIGH (ref 8–23)
CO2: 28 mmol/L (ref 22–32)
Calcium: 8.4 mg/dL — ABNORMAL LOW (ref 8.9–10.3)
Chloride: 105 mmol/L (ref 98–111)
Creatinine, Ser: 1.17 mg/dL (ref 0.61–1.24)
GFR, Estimated: >60 mL/min (ref >60)
Glucose, Bld: 88 mg/dL (ref 70–99)
Potassium: 3.1 mmol/L — ABNORMAL LOW (ref 3.5–5.1)
Sodium: 139 mmol/L (ref 135–145)

## 2022-01-01 LAB — CBC
HCT: 34.8 % — ABNORMAL LOW (ref 39.0–52.0)
Hemoglobin: 9.6 g/dL — ABNORMAL LOW (ref 13.0–17.0)
MCH: 21.7 pg — ABNORMAL LOW (ref 26.0–34.0)
MCHC: 27.6 g/dL — ABNORMAL LOW (ref 30.0–36.0)
MCV: 78.7 fL — ABNORMAL LOW (ref 80.0–100.0)
Platelets: 172 10*3/uL (ref 150–400)
RBC: 4.42 MIL/uL (ref 4.22–5.81)
RDW: 18.6 % — ABNORMAL HIGH (ref 11.5–15.5)
WBC: 10.5 10*3/uL (ref 4.0–10.5)
nRBC: 0.3 % — ABNORMAL HIGH (ref 0.0–0.2)

## 2022-01-01 LAB — URINE CULTURE: Culture: NO GROWTH

## 2022-01-01 LAB — MAGNESIUM: Magnesium: 1.8 mg/dL (ref 1.7–2.4)

## 2022-01-01 LAB — PROCALCITONIN: Procalcitonin: 0.16 ng/mL

## 2022-01-01 LAB — PHOSPHORUS: Phosphorus: 2.4 mg/dL — ABNORMAL LOW (ref 2.5–4.6)

## 2022-01-01 MED ORDER — CLONAZEPAM 0.5 MG PO TBDP
0.5000 mg | ORAL_TABLET | Freq: Two times a day (BID) | ORAL | Status: DC | PRN
  Administered 2022-01-01 – 2022-01-05 (×5): 0.5 mg via ORAL
  Filled 2022-01-01 (×5): qty 1

## 2022-01-01 MED ORDER — POTASSIUM & SODIUM PHOSPHATES 280-160-250 MG PO PACK
2.0000 | PACK | Freq: Three times a day (TID) | ORAL | Status: DC
  Administered 2022-01-01: 2 via ORAL
  Filled 2022-01-01 (×2): qty 2

## 2022-01-01 MED ORDER — IPRATROPIUM-ALBUTEROL 0.5-2.5 (3) MG/3ML IN SOLN
3.0000 mL | Freq: Four times a day (QID) | RESPIRATORY_TRACT | Status: DC | PRN
  Administered 2022-01-01: 3 mL via RESPIRATORY_TRACT
  Filled 2022-01-01: qty 3

## 2022-01-01 MED ORDER — MENTHOL 3 MG MT LOZG
1.0000 | LOZENGE | OROMUCOSAL | Status: DC | PRN
  Administered 2022-01-01 – 2022-01-03 (×3): 3 mg via ORAL
  Filled 2022-01-01: qty 9

## 2022-01-01 MED ORDER — MELATONIN 5 MG PO TABS
5.0000 mg | ORAL_TABLET | Freq: Every day | ORAL | Status: DC
  Administered 2022-01-01 – 2022-01-05 (×5): 5 mg via ORAL
  Filled 2022-01-01 (×5): qty 1

## 2022-01-01 MED ORDER — POTASSIUM PHOSPHATES 15 MMOLE/5ML IV SOLN
15.0000 mmol | Freq: Once | INTRAVENOUS | Status: DC
  Administered 2022-01-01: 15 mmol via INTRAVENOUS
  Filled 2022-01-01: qty 5

## 2022-01-01 MED ORDER — BENZONATATE 100 MG PO CAPS
200.0000 mg | ORAL_CAPSULE | Freq: Two times a day (BID) | ORAL | Status: DC | PRN
  Administered 2022-01-01: 200 mg via ORAL
  Filled 2022-01-01: qty 2

## 2022-01-01 MED ORDER — SODIUM CHLORIDE 0.9 % IV SOLN
300.0000 mg | Freq: Once | INTRAVENOUS | Status: AC
  Administered 2022-01-01: 300 mg via INTRAVENOUS
  Filled 2022-01-01: qty 300

## 2022-01-01 MED ORDER — TORSEMIDE 20 MG PO TABS
20.0000 mg | ORAL_TABLET | Freq: Every day | ORAL | Status: DC
  Administered 2022-01-02 – 2022-01-06 (×5): 20 mg via ORAL
  Filled 2022-01-01 (×5): qty 1

## 2022-01-01 MED ORDER — MAGNESIUM SULFATE 2 GM/50ML IV SOLN
2.0000 g | Freq: Once | INTRAVENOUS | Status: AC
  Administered 2022-01-01: 2 g via INTRAVENOUS
  Filled 2022-01-01: qty 50

## 2022-01-01 MED ORDER — POTASSIUM CHLORIDE 10 MEQ/100ML IV SOLN
10.0000 meq | INTRAVENOUS | Status: AC
  Administered 2022-01-01 (×3): 10 meq via INTRAVENOUS
  Filled 2022-01-01 (×3): qty 100

## 2022-01-01 MED ORDER — ESCITALOPRAM OXALATE 10 MG PO TABS
5.0000 mg | ORAL_TABLET | Freq: Every day | ORAL | Status: DC
  Administered 2022-01-02 – 2022-01-06 (×5): 5 mg via ORAL
  Filled 2022-01-01 (×5): qty 0.5

## 2022-01-01 MED ORDER — QUETIAPINE FUMARATE 25 MG PO TABS: 25.0000 mg | ORAL_TABLET | Freq: Every evening | ORAL | Status: DC | PRN

## 2022-01-01 MED ORDER — ORAL CARE MOUTH RINSE: 15.0000 mL | OROMUCOSAL | Status: DC | PRN

## 2022-01-01 MED ORDER — ORAL CARE MOUTH RINSE
15.0000 mL | OROMUCOSAL | Status: DC
  Administered 2022-01-01 – 2022-01-06 (×11): 15 mL via OROMUCOSAL

## 2022-01-01 NOTE — Consult Note (Signed)
PHARMACY CONSULT NOTE - FOLLOW UP  Pharmacy Consult for Electrolyte Monitoring and Replacement   Recent Labs: Potassium (mmol/L)  Date Value  01/01/2022 3.1 (L)  04/15/2014 3.9   Magnesium (mg/dL)  Date Value  01/01/2022 1.8  08/21/2012 1.9   Calcium (mg/dL)  Date Value  01/01/2022 8.4 (L)   Calcium, Total (mg/dL)  Date Value  04/15/2014 8.3 (L)   Albumin (g/dL)  Date Value  12/30/2021 3.0 (L)  04/15/2014 2.9 (L)   Phosphorus (mg/dL)  Date Value  01/01/2022 2.4 (L)   Sodium (mmol/L)  Date Value  01/01/2022 139  03/15/2020 145 (H)  04/15/2014 140     Assessment: 66 yo M with h/o DDD, OA, HTN, diaCHF, polysub abuse, TCP, CKD3, IDA, HLD, MDD, & COPD with a chronic tracheostomy presenting to [facility] ED from home via EMS on 12/30/21 for evaluation of hypoxia.   Goal of Therapy:  WNL  Medications: +lasix IV '20mg'$  QD  Plan:  Scr 1.44>1.17 [I/O -1.5L; UOP 0.51m/k/h] K 4>3.1: Replete with KCL 170m q1h x3 doses Phos 2.5>2.4: Replete with Kphos 1531m IV x1 (will provide 49m34m+) Mg 1.9>1.8:  Replete with MgSO 2g IV x1 F/u with AM labs.   BranLorna DibblearmD Clinical Pharmacist 01/01/2022 7:40 AM

## 2022-01-01 NOTE — Progress Notes (Signed)
Patient states he is not ready to go on ventilator at this time. Will check back in an hour

## 2022-01-01 NOTE — Progress Notes (Signed)
Pt asking that he be taken off of the ventilator. He says that he was told he only had to be on it for 5 hours. RT called and they said that the pt had agreed to stay on the vent until 0600. When pt was reminded of this he became irritated and demanded that he be taken off the vent. He made it known that he would take it off himself if staff did not. RT made aware and they came and took the pt off of the vent.
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
## 2022-01-01 NOTE — Progress Notes (Signed)
Pt refused CBG check again.

## 2022-01-01 NOTE — Progress Notes (Signed)
Pt continues to refuse blood sugar checks.
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
## 2022-01-01 NOTE — Progress Notes (Signed)
Patient now off vent. He initially asked me to take him off at 6am.  I am not aware of patient only needing to wear for 5 hours per info from patient to RN as information was not given to me during rounding nor had patient mentioned such when he was put on vent at 11pm.

## 2022-01-02 DIAGNOSIS — G928 Other toxic encephalopathy: Secondary | ICD-10-CM | POA: Diagnosis not present

## 2022-01-02 LAB — BASIC METABOLIC PANEL
Anion gap: 9 (ref 5–15)
BUN: 27 mg/dL — ABNORMAL HIGH (ref 8–23)
CO2: 29 mmol/L (ref 22–32)
Calcium: 8.6 mg/dL — ABNORMAL LOW (ref 8.9–10.3)
Chloride: 105 mmol/L (ref 98–111)
Creatinine, Ser: 1 mg/dL (ref 0.61–1.24)
GFR, Estimated: >60 mL/min (ref >60)
Glucose, Bld: 96 mg/dL (ref 70–99)
Potassium: 3.1 mmol/L — ABNORMAL LOW (ref 3.5–5.1)
Sodium: 143 mmol/L (ref 135–145)

## 2022-01-02 LAB — CBC
HCT: 34.2 % — ABNORMAL LOW (ref 39.0–52.0)
Hemoglobin: 9.5 g/dL — ABNORMAL LOW (ref 13.0–17.0)
MCH: 21.9 pg — ABNORMAL LOW (ref 26.0–34.0)
MCHC: 27.8 g/dL — ABNORMAL LOW (ref 30.0–36.0)
MCV: 78.8 fL — ABNORMAL LOW (ref 80.0–100.0)
Platelets: 174 10*3/uL (ref 150–400)
RBC: 4.34 MIL/uL (ref 4.22–5.81)
RDW: 18.6 % — ABNORMAL HIGH (ref 11.5–15.5)
WBC: 7.8 10*3/uL (ref 4.0–10.5)
nRBC: 0.5 % — ABNORMAL HIGH (ref 0.0–0.2)

## 2022-01-02 LAB — MAGNESIUM: Magnesium: 2.2 mg/dL (ref 1.7–2.4)

## 2022-01-02 LAB — PHOSPHORUS: Phosphorus: 2.9 mg/dL (ref 2.5–4.6)

## 2022-01-02 MED ORDER — POTASSIUM CHLORIDE CRYS ER 20 MEQ PO TBCR
40.0000 meq | EXTENDED_RELEASE_TABLET | ORAL | Status: AC
  Administered 2022-01-02 (×2): 40 meq via ORAL
  Filled 2022-01-02 (×2): qty 2

## 2022-01-02 MED ORDER — APIXABAN 2.5 MG PO TABS
2.5000 mg | ORAL_TABLET | Freq: Two times a day (BID) | ORAL | Status: DC
  Administered 2022-01-02 – 2022-01-06 (×8): 2.5 mg via ORAL
  Filled 2022-01-02 (×8): qty 1

## 2022-01-02 MED ORDER — GABAPENTIN 400 MG PO CAPS
400.0000 mg | ORAL_CAPSULE | Freq: Three times a day (TID) | ORAL | Status: DC
  Administered 2022-01-02 – 2022-01-06 (×12): 400 mg via ORAL
  Filled 2022-01-02 (×12): qty 1

## 2022-01-02 MED ORDER — PANTOPRAZOLE SODIUM 40 MG PO TBEC
40.0000 mg | DELAYED_RELEASE_TABLET | Freq: Every day | ORAL | Status: DC
  Administered 2022-01-02 – 2022-01-05 (×4): 40 mg via ORAL
  Filled 2022-01-02 (×4): qty 1

## 2022-01-02 NOTE — Plan of Care (Signed)
°  Problem: Activity: °Goal: Ability to tolerate increased activity will improve °Outcome: Progressing °  °Problem: Respiratory: °Goal: Ability to maintain a clear airway and adequate ventilation will improve °Outcome: Progressing °  °Problem: Role Relationship: °Goal: Method of communication will improve °Outcome: Progressing °  °Problem: Education: °Goal: Knowledge of General Education information will improve °Description: Including pain rating scale, medication(s)/side effects and non-pharmacologic comfort measures °Outcome: Progressing °  °Problem: Health Behavior/Discharge Planning: °Goal: Ability to manage health-related needs will improve °Outcome: Progressing °  °Problem: Clinical Measurements: °Goal: Ability to maintain clinical measurements within normal limits will improve °Outcome: Progressing °Goal: Will remain free from infection °Outcome: Progressing °Goal: Diagnostic test results will improve °Outcome: Progressing °Goal: Respiratory complications will improve °Outcome: Progressing °Goal: Cardiovascular complication will be avoided °Outcome: Progressing °  °Problem: Activity: °Goal: Risk for activity intolerance will decrease °Outcome: Progressing °  °Problem: Nutrition: °Goal: Adequate nutrition will be maintained °Outcome: Progressing °  °Problem: Coping: °Goal: Level of anxiety will decrease °Outcome: Progressing °  °Problem: Elimination: °Goal: Will not experience complications related to bowel motility °Outcome: Progressing °Goal: Will not experience complications related to urinary retention °Outcome: Progressing °  °Problem: Pain Managment: °Goal: General experience of comfort will improve °Outcome: Progressing °  °Problem: Safety: °Goal: Ability to remain free from injury will improve °Outcome: Progressing °  °Problem: Skin Integrity: °Goal: Risk for impaired skin integrity will decrease °Outcome: Progressing °  °
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
## 2022-01-02 NOTE — TOC Progression Note (Signed)
Transition of Care [facility]) - Progression Note    Patient Details  Name: Gary [name] MRN: [id number] Date of Birth: 08/16/1955  Transition [facility]) CM/SW Contact  Shelbie Hutching, RN Phone Number: 01/02/2022, 10:51 AM  Clinical Narrative:    Patient agreed to wear the ventilator at night, he would like to get set up with home vent so he will not have to keep returning to the hospital. Thedore Mins with [employer] notified that patient needs a home vent and he was compliant last night for 5 hours.  Patient will need to complete an overnight here in the hospital with a family member before discharging home with home vent.     Expected Discharge Plan: [place] Barriers to Discharge: Continued Medical Work up  Expected Discharge Plan and Services Expected Discharge Plan: [place] arrangements for the past 2 months: Single Family Home                                       Social Determinants of Health (SDOH) Interventions    Readmission Risk Interventions    12/31/2021    8:58 AM 12/20/2021    4:21 PM 05/04/2021    1:26 PM  Readmission Risk Prevention Plan  Transportation Screening Complete Complete Complete  Medication Review Press photographer) Complete Complete Complete  PCP or Specialist appointment within 3-5 days of discharge Complete Complete Complete  HRI or Home Care Consult Complete Complete Complete  SW Recovery Care/Counseling Consult Complete Complete Complete  Palliative Care Screening Complete Complete Not [place] Not Applicable Not Applicable Not Applicable

## 2022-01-02 NOTE — Progress Notes (Signed)
Patient states he does not need any trach care at this time and does not need suctioning. Asks me to come back around 11pm-12am to place on ventilator for the night. No distress noted.

## 2022-01-02 NOTE — Consult Note (Signed)
[place] for Electrolyte Monitoring and Replacement   Recent Labs: Potassium (mmol/L)  Date Value  01/02/2022 3.1 (L)  04/15/2014 3.9   Magnesium (mg/dL)  Date Value  01/02/2022 2.2  08/21/2012 1.9   Calcium (mg/dL)  Date Value  01/02/2022 8.6 (L)   Calcium, Total (mg/dL)  Date Value  04/15/2014 8.3 (L)   Albumin (g/dL)  Date Value  12/30/2021 3.0 (L)  04/15/2014 2.9 (L)   Phosphorus (mg/dL)  Date Value  01/02/2022 2.9   Sodium (mmol/L)  Date Value  01/02/2022 143  03/15/2020 145 (H)  04/15/2014 140     Assessment: 66 yo M with h/o DDD, OA, HTN, diaCHF, polysub abuse, TCP, CKD3, IDA, HLD, MDD, & COPD with a chronic tracheostomy presenting to [facility] ED from home via EMS on 12/30/21 for evaluation of hypoxia.   Diuretics: Torsemide 20 mg daily  Goal of Therapy:  Electrolytes within normal limits  Plan:  --K 3.1, Kcl 40 mEq q4h x 2 doses --Follow-up electrolytes with AM labs tomorrow  Benita Gutter 01/02/2022 7:35 AM

## 2022-01-03 LAB — BASIC METABOLIC PANEL
Anion gap: 7 (ref 5–15)
BUN: 29 mg/dL — ABNORMAL HIGH (ref 8–23)
CO2: 28 mmol/L (ref 22–32)
Calcium: 8.6 mg/dL — ABNORMAL LOW (ref 8.9–10.3)
Chloride: 108 mmol/L (ref 98–111)
Creatinine, Ser: 1.25 mg/dL — ABNORMAL HIGH (ref 0.61–1.24)
GFR, Estimated: >60 mL/min (ref >60)
Glucose, Bld: 115 mg/dL — ABNORMAL HIGH (ref 70–99)
Potassium: 3.7 mmol/L (ref 3.5–5.1)
Sodium: 143 mmol/L (ref 135–145)

## 2022-01-03 LAB — CBC
HCT: 34 % — ABNORMAL LOW (ref 39.0–52.0)
Hemoglobin: 9.3 g/dL — ABNORMAL LOW (ref 13.0–17.0)
MCH: 21.5 pg — ABNORMAL LOW (ref 26.0–34.0)
MCHC: 27.4 g/dL — ABNORMAL LOW (ref 30.0–36.0)
MCV: 78.7 fL — ABNORMAL LOW (ref 80.0–100.0)
Platelets: 181 10*3/uL (ref 150–400)
RBC: 4.32 MIL/uL (ref 4.22–5.81)
RDW: 18.7 % — ABNORMAL HIGH (ref 11.5–15.5)
WBC: 7.4 10*3/uL (ref 4.0–10.5)
nRBC: 0.7 % — ABNORMAL HIGH (ref 0.0–0.2)

## 2022-01-03 LAB — POTASSIUM: Potassium: 3.7 mmol/L (ref 3.5–5.1)

## 2022-01-03 LAB — PHOSPHORUS: Phosphorus: 2.9 mg/dL (ref 2.5–4.6)

## 2022-01-03 LAB — MAGNESIUM: Magnesium: 2 mg/dL (ref 1.7–2.4)

## 2022-01-03 NOTE — Evaluation (Addendum)
Physical Therapy Evaluation Patient Details Name: Gary [name] MRN: [id number] DOB: [birth year] Today's Date: 01/03/2022  History of Present Illness  Patient was recently discharged on 12/13/21 after a prolonged admission for respiratory failure for a total of 2 months. This was complicated by tracheostomy placement on 10/03/21. During this admission after being weaned from the ventilator to trach collar, the patient had episodes of hypercapnia, requiring ventilator support particularly at night. However, patient was non compliant and would refuse to wear the ventilator overnight to prevent hypercapnia. PCCM team recommended triology home vent at discharge, however due to inpatient noncompliance he was discharged home with home health support and DME including trach collar oxygen. Now admitted via EMS on 12/18/21 with complaints of worsening shortness of breath over the past 2 days.    Clinical Impression  Pt received in supine position and agreeable to therapy after given encouragement.  Pt in supinse position following being changed by nursing.  Nursing requesting for pt to not be assisted in standing position as he was just repositioned.  Pt also deferred standing at this time stating "I need leg exercises more than standing.  Standing isn't going to do anything for me."  Pt educated on the importance of mobility and working with therapist throughout hospital stay in order to improve cardiovascular performance and strength of LE's in order to potentially ambulate at later time.  Pt did put forth good effort and required AAROM with certain exercises against gravity.  Pt assisted and then performed eccentric lowering to promote increased strengthening.  Pt assisted with positioning higher in the bed prior to leaving room.  Pt left with nursing in room and all needs met and call bell within reach.  Current discharge plans to HHPT with [facility] [place] services as possible at this time.  Pt will continue to benefit  from skilled therapy in order to address deficits listed below.       Pt given HEP printout of the following:  Access Code: [email or web address] URL: [email or web address] Date: 01/03/2022 Prepared by: [place]  Exercises - Supine Ankle Dorsiflexion and Plantarflexion AROM  - 3 x daily - 7 x weekly - 10 reps - 2 hold - Supine Quadricep Sets  - 3 x daily - 7 x weekly - 3 sets - 10 reps - Supine Heel Slides  - 3 x daily - 7 x weekly - 3 sets - 10 reps - 5 hold - Supine Hip Abduction AROM  - 3 x daily - 7 x weekly - 10 reps - Supine Isometric Hip Adduction with Pillow at Knees  - 3 x daily - 7 x weekly - 3 sets - 10 reps - 5 hold - Supine Active Straight Leg Raise  - 1 x daily - 7 x weekly - 3 sets - 10 reps     Recommendations for follow up therapy are one component of a multi-disciplinary discharge planning process, led by the attending physician.  Recommendations may be updated based on patient status, additional functional criteria and insurance authorization.  Follow Up Recommendations Home health PT (patient refusing all other discharge options; recommend home with [facility] services) Can patient physically be transported by private vehicle: No    Assistance Recommended at Discharge Frequent or constant Supervision/Assistance  Patient can return home with the following  Two people to help with walking and/or transfers;Two people to help with bathing/dressing/bathroom;Assistance with cooking/housework;Assist for transportation;Help with stairs or ramp for entrance    Equipment Recommendations None recommended by PT  Recommendations  for Other Services       Functional Status Assessment Patient has had a recent decline in their functional status and/or demonstrates limited ability to make significant improvements in function in a reasonable and predictable amount of time     Precautions / Restrictions Precautions Precautions: Fall Precaution Comments: cuffed trach       Mobility  Bed Mobility               General bed mobility comments: refused activity beyond bed-level activities    Transfers                        Ambulation/Gait                  Stairs            Wheelchair Mobility    Modified Rankin (Stroke Patients Only)       Balance       Sitting balance - Comments: deferred any mobility at this session, only bed-level exercises.                                     Pertinent Vitals/Pain Pain Assessment Pain Assessment: No/denies pain    Home Living Family/patient expects to be discharged to:: Private residence Living Arrangements: Non-relatives/Friends Available Help at Discharge: Family Type of Home: Apartment Home Access: Level entry;Ramped entrance       Home Layout: One level Home Equipment: Cane - single point;Wheelchair - IT trainer (2 wheels);Hospital bed (hoyer lift)      Prior Function Prior Level of Function : Needs assist             Mobility Comments: Pt reports no getting any therapy once d/c home. ADLs Comments: needing assist for all mobility     Hand Dominance   Dominant Hand: Right    Extremity/Trunk Assessment   Upper Extremity Assessment Upper Extremity Assessment: Overall WFL for tasks assessed    Lower Extremity Assessment Lower Extremity Assessment: Generalized weakness (Pt able to contract musuclature against gravity) RLE Sensation: history of peripheral neuropathy LLE Sensation: history of peripheral neuropathy       Communication   Communication: Tracheostomy;Passy-Muir valve  Cognition Arousal/Alertness: Awake/alert Behavior During Therapy: WFL for tasks assessed/performed, Flat affect Overall Cognitive Status: Within Functional Limits for tasks assessed                                          General Comments      Exercises Total Joint Exercises Ankle Circles/Pumps: AROM,  Strengthening, 10 reps, Supine, Both Quad Sets: AROM, Strengthening, Both, 10 reps, Supine Gluteal Sets: AROM, Strengthening, Both, 10 reps, Supine Heel Slides: AROM, Left, AAROM, Right, Strengthening, 10 reps, Supine Hip ABduction/ADduction: AROM, Strengthening, Both, 10 reps, Supine Straight Leg Raises: AROM, Left, AAROM, Right, Strengthening, 10 reps, Supine   Assessment/Plan    PT Assessment Patient needs continued PT services  PT Problem List Decreased strength;Decreased mobility;Decreased activity tolerance;Cardiopulmonary status limiting activity;Decreased balance;Decreased skin integrity;Obesity       PT Treatment Interventions Functional mobility training;Neuromuscular re-education;Balance training;Therapeutic exercise;Therapeutic activities    PT Goals (Current goals can be found in the Care Plan section)  Acute Rehab PT Goals Patient Stated Goal: go home PT Goal Formulation: With patient Time For Goal Achievement: 01/17/22  Potential to Achieve Goals: Fair    Frequency Min 2X/week     Co-evaluation               AM-PAC PT "6 Clicks" Mobility  Outcome Measure Help needed turning from your back to your side while in a flat bed without using bedrails?: A Little Help needed moving from lying on your back to sitting on the side of a flat bed without using bedrails?: A Lot Help needed moving to and from a bed to a chair (including a wheelchair)?: Total Help needed standing up from a chair using your arms (e.g., wheelchair or bedside chair)?: Total Help needed to walk in hospital room?: Total Help needed climbing 3-5 steps with a railing? : Total 6 Click Score: 9    End of Session Equipment Utilized During Treatment: Oxygen Activity Tolerance: Patient tolerated treatment well Patient left: with call bell/phone within reach;in bed;with bed alarm set;with nursing/sitter in room Nurse Communication: Mobility status PT Visit Diagnosis: Unsteadiness on feet  (R26.81);Muscle weakness (generalized) (M62.81);Difficulty in walking, not elsewhere classified (R26.2)    Time: 8069-9967 PT Time Calculation (min) (ACUTE ONLY): 36 min   Charges:   PT Evaluation $PT Eval Low Complexity: 1 Low PT Treatments $Therapeutic Exercise: 8-22 mins        Gwenlyn Saran, PT, DPT 01/03/22, 4:28 PM

## 2022-01-03 NOTE — TOC Progression Note (Signed)
Transition of Care [facility]) - Progression Note    Patient Details  Name: Gary [name] MRN: [id number] Date of Birth: 01/01/1956  [facility] CM/SW Contact  Shelbie Hutching, RN Phone Number: 01/03/2022, 9:08 AM  Clinical Narrative:    RNCM spoke with Nile Dear EMT with [place], she follows with patient at home.  She reports that [facility] has not had staff to go out and see patient, they only had one nurse available for this week and just for one day.  The patient is afraid to be left alone at home but there are times when his girlfriend needs to run errands and she can't be there 24/7.  RNCM will reach out to [facility].  [employer] working on setting up home vent.     Expected Discharge Plan: [place] Barriers to Discharge: Continued Medical Work up  Expected Discharge Plan and Services Expected Discharge Plan: [place] arrangements for the past 2 months: Single Family Home                                       Social Determinants of Health (SDOH) Interventions    Readmission Risk Interventions    12/31/2021    8:58 AM 12/20/2021    4:21 PM 05/04/2021    1:26 PM  Readmission Risk Prevention Plan  Transportation Screening Complete Complete Complete  Medication Review [employer]) Complete Complete Complete  PCP or Specialist appointment within 3-5 days of discharge Complete Complete Complete  HRI or Home Care Consult Complete Complete Complete  SW Recovery Care/Counseling Consult Complete Complete Complete  Palliative Care Screening Complete Complete Not [place] Not Applicable Not Applicable Not Applicable
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
## 2022-01-03 NOTE — Consult Note (Signed)
[place] for Electrolyte Monitoring and Replacement   Recent Labs: Potassium (mmol/L)  Date Value  01/03/2022 3.7  04/15/2014 3.9   Magnesium (mg/dL)  Date Value  01/03/2022 2.0  08/21/2012 1.9   Calcium (mg/dL)  Date Value  01/03/2022 8.6 (L)   Calcium, Total (mg/dL)  Date Value  04/15/2014 8.3 (L)   Albumin (g/dL)  Date Value  12/30/2021 3.0 (L)  04/15/2014 2.9 (L)   Phosphorus (mg/dL)  Date Value  01/03/2022 2.9   Sodium (mmol/L)  Date Value  01/03/2022 143  03/15/2020 145 (H)  04/15/2014 140     Assessment: 66 yo M with h/o DDD, OA, HTN, diaCHF, polysub abuse, TCP, CKD3, IDA, HLD, MDD, & COPD with a chronic tracheostomy presenting to [facility] ED from home via EMS on 12/30/21 for evaluation of hypoxia.   Diuretics: Torsemide 20 mg daily  Goal of Therapy:  Electrolytes within normal limits  Plan:  --No electrolyte replacement indicated at this time --Follow-up electrolytes with AM labs tomorrow  Benita Gutter 01/03/2022 7:37 AM

## 2022-01-04 DIAGNOSIS — A419 Sepsis, unspecified organism: Secondary | ICD-10-CM

## 2022-01-04 LAB — CBC
HCT: 34.9 % — ABNORMAL LOW (ref 39.0–52.0)
Hemoglobin: 9.7 g/dL — ABNORMAL LOW (ref 13.0–17.0)
MCH: 22.2 pg — ABNORMAL LOW (ref 26.0–34.0)
MCHC: 27.8 g/dL — ABNORMAL LOW (ref 30.0–36.0)
MCV: 79.9 fL — ABNORMAL LOW (ref 80.0–100.0)
Platelets: 180 10*3/uL (ref 150–400)
RBC: 4.37 MIL/uL (ref 4.22–5.81)
RDW: 18.6 % — ABNORMAL HIGH (ref 11.5–15.5)
WBC: 8.5 10*3/uL (ref 4.0–10.5)
nRBC: 0.5 % — ABNORMAL HIGH (ref 0.0–0.2)

## 2022-01-04 LAB — BASIC METABOLIC PANEL
Anion gap: 6 (ref 5–15)
BUN: 38 mg/dL — ABNORMAL HIGH (ref 8–23)
CO2: 28 mmol/L (ref 22–32)
Calcium: 8.8 mg/dL — ABNORMAL LOW (ref 8.9–10.3)
Chloride: 107 mmol/L (ref 98–111)
Creatinine, Ser: 1.17 mg/dL (ref 0.61–1.24)
GFR, Estimated: >60 mL/min (ref >60)
Glucose, Bld: 95 mg/dL (ref 70–99)
Potassium: 4 mmol/L (ref 3.5–5.1)
Sodium: 141 mmol/L (ref 135–145)

## 2022-01-04 LAB — MAGNESIUM: Magnesium: 2 mg/dL (ref 1.7–2.4)

## 2022-01-04 LAB — CULTURE, BLOOD (ROUTINE X 2)
Culture: NO GROWTH
Culture: NO GROWTH
Special Requests: ADEQUATE
Special Requests: ADEQUATE

## 2022-01-04 LAB — PHOSPHORUS: Phosphorus: 3.6 mg/dL (ref 2.5–4.6)

## 2022-01-04 NOTE — TOC Progression Note (Signed)
Transition of Care [facility]) - Progression Note    Patient Details  Name: Gary [name] MRN: [id number] Date of Birth: Jan 12, 1956  [facility] CM/SW Contact  Shelbie Hutching, RN Phone Number: 01/04/2022, 3:22 PM  Clinical Narrative:    Patient's girlfriend, [name] will be able to come to the hospital for teaching tomorrow at 10 am.  Should be able to plan for discharge on Sat.    Expected Discharge Plan: [place] Barriers to Discharge: Continued Medical Work up  Expected Discharge Plan and Services Expected Discharge Plan: [place] arrangements for the past 2 months: Single Family Home                                       Social Determinants of Health (SDOH) Interventions    Readmission Risk Interventions    12/31/2021    8:58 AM 12/20/2021    4:21 PM 05/04/2021    1:26 PM  Readmission Risk Prevention Plan  Transportation Screening Complete Complete Complete  Medication Review Press photographer) Complete Complete Complete  PCP or Specialist appointment within 3-5 days of discharge Complete Complete Complete  HRI or Home Care Consult Complete Complete Complete  SW Recovery Care/Counseling Consult Complete Complete Complete  Palliative Care Screening Complete Complete Not [place] Not Applicable Not Applicable Not Applicable

## 2022-01-04 NOTE — Progress Notes (Signed)
Physical Therapy Treatment Patient Details Name: Gary [name] MRN: [id number] DOB: [birth year] Today's Date: 01/04/2022   History of Present Illness Patient was recently discharged on 12/13/21 after a prolonged admission for respiratory failure for a total of 2 months. This was complicated by tracheostomy placement on 10/03/21. During this admission after being weaned from the ventilator to trach collar, the patient had episodes of hypercapnia, requiring ventilator support particularly at night. However, patient was non compliant and would refuse to wear the ventilator overnight to prevent hypercapnia. PCCM team recommended triology home vent at discharge, however due to inpatient noncompliance he was discharged home with home health support and DME including trach collar oxygen. Now admitted via EMS on 12/18/21 with complaints of worsening shortness of breath over the past 2 days.    PT Comments    Pt was long sitting in bed upon arriving. He is A and O x 4 and agreeable to session but was unwilling to attempt standing. Was agreeable to bed level there ex and then hoyer transfer to recliner. Overall pt did well and pt forth great effort throughout session. See exercises listed below for exercises performed. Pt will greatly benefit from continued skilled PT at DC  to address deficits while maximizing independence with ADLs.    Recommendations for follow up therapy are one component of a multi-disciplinary discharge planning process, led by the attending physician.  Recommendations may be updated based on patient status, additional functional criteria and insurance authorization.  Follow Up Recommendations  Home health PT (pt reports HHPT did not come out to house after recent DC ~ 1 week prior)     Assistance Recommended at Discharge Frequent or constant Supervision/Assistance  Patient can return home with the following Two people to help with walking and/or transfers;Two people to help with  bathing/dressing/bathroom;Assistance with cooking/housework;Assist for transportation;Help with stairs or ramp for entrance   Equipment Recommendations  None recommended by PT       Precautions / Restrictions Precautions Precautions: Fall Precaution Comments: cuffed trach/PMV/ trach collar 5 L Restrictions Weight Bearing Restrictions: No     Mobility  Bed Mobility Overal bed mobility: Needs Assistance Bed Mobility: Rolling Rolling: Min assist   Supine to sit: Min assist Sit to supine: Max assist   Transfers Overall transfer level: Needs assistance  Transfers: Bed to chair/wheelchair/BSC    General transfer comment: pt was hoyer transferred to/from recliner. pt tolerated sitting in chair x ~ 2 .5 hours. Transfer via Geophysicist/field seismologist:  Product manager)  Ambulation/Gait    General Gait Details: unable   Balance Overall balance assessment: Needs assistance Sitting-balance support: Feet supported, Bilateral upper extremity supported Sitting balance-[name] Scale: Good  Standing balance support: Bilateral upper extremity supported, Reliant on assistive device for balance Standing balance-[name] Scale: Poor    Cognition Arousal/Alertness: Awake/alert Behavior During Therapy: WFL for tasks assessed/performed, Flat affect Overall Cognitive Status: Within Functional Limits for tasks assessed Area of Impairment: Safety/judgement, Awareness      Following Commands: Follows one step commands with increased time, Follows one step commands consistently Safety/Judgement: Decreased awareness of safety, Decreased awareness of deficits   Problem Solving: Slow processing, Requires verbal cues, Requires tactile cues General Comments: Pt was A and O x 4. does c/o R knee pain        Exercises General Exercises - Lower Extremity Ankle Circles/Pumps: AROM, 10 reps, Both Quad Sets: AROM, 10 reps, Both Long Arc Quad: AROM, 10 reps, Left, AAROM, Right, Seated Heel Slides: Seated, 10 reps, Both Hip  ABduction/ADduction: AROM, 10 reps, Supine Straight Leg Raises: AAROM, 10 reps, Supine        Pertinent Vitals/Pain Pain Assessment Pain Assessment: 0-10 Pain Score: 4  Pain Location: L knee Pain Descriptors / Indicators: Discomfort, Shooting, Sharp Pain Intervention(s): Limited activity within patient's tolerance, Monitored during session, Premedicated before session, Repositioned     PT Goals (current goals can now be found in the care plan section) Acute Rehab PT Goals Patient Stated Goal: go home Progress towards PT goals: Progressing toward goals    Frequency    Min 2X/week      PT Plan Current plan remains appropriate    Co-evaluation     PT goals addressed during session: Balance;Mobility/safety with mobility;Proper use of DME;Strengthening/ROM        AM-PAC PT "6 Clicks" Mobility   Outcome Measure  Help needed turning from your back to your side while in a flat bed without using bedrails?: A Little Help needed moving from lying on your back to sitting on the side of a flat bed without using bedrails?: A Little Help needed moving to and from a bed to a chair (including a wheelchair)?: Total Help needed standing up from a chair using your arms (e.g., wheelchair or bedside chair)?: Total Help needed to walk in hospital room?: Total Help needed climbing 3-5 steps with a railing? : Total 6 Click Score: 10    End of Session Equipment Utilized During Treatment: Oxygen (5 L trach collar) Activity Tolerance: Patient tolerated treatment well;Patient limited by fatigue Patient left: with call bell/phone within reach;in bed;with bed alarm set;with nursing/sitter in room Nurse Communication: Mobility status PT Visit Diagnosis: Unsteadiness on feet (R26.81);Muscle weakness (generalized) (M62.81);Difficulty in walking, not elsewhere classified (R26.2)     Time: 8099-8338 PT Time Calculation (min) (ACUTE ONLY): 33 min  Charges:  $Therapeutic Exercise: 8-22  mins $Therapeutic Activity: 8-22 mins                    Julaine Fusi PTA 01/04/22, 4:20 PM
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
## 2022-01-04 NOTE — Consult Note (Signed)
[place] for Electrolyte Monitoring and Replacement   Recent Labs: Potassium (mmol/L)  Date Value  01/04/2022 4.0  04/15/2014 3.9   Magnesium (mg/dL)  Date Value  01/04/2022 2.0  08/21/2012 1.9   Calcium (mg/dL)  Date Value  01/04/2022 8.8 (L)   Calcium, Total (mg/dL)  Date Value  04/15/2014 8.3 (L)   Albumin (g/dL)  Date Value  12/30/2021 3.0 (L)  04/15/2014 2.9 (L)   Phosphorus (mg/dL)  Date Value  01/04/2022 3.6   Sodium (mmol/L)  Date Value  01/04/2022 141  03/15/2020 145 (H)  04/15/2014 140   Assessment: 66 yo M with h/o DDD, OA, HTN, diaCHF, polysub abuse, TCP, CKD3, IDA, HLD, MDD, & COPD with a chronic tracheostomy presenting to [facility] ED from home via EMS on 12/30/21 for evaluation of hypoxia.   Diuretics: Torsemide 20 mg daily  Goal of Therapy:  Electrolytes within normal limits  Plan:  --No electrolyte replacement indicated at this time. Electrolytes have been stable and not requiring replacement for several days --Will discontinue electrolyte consult at this time. Defer further ordering of labs and electrolyte replacement to primary team --Pharmacy will continue to follow along peripherally  Benita Gutter 01/04/2022 7:33 AM
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
## 2022-01-05 NOTE — Progress Notes (Signed)
Patient was able to place himself on home vent. Rt suctioned airway prior to w/o difficulty. Vent noted to be set as PSV 10 peep 5.  Patient on room air with vent. Requested to come off at 5am.  Will continue to monitor

## 2022-01-05 NOTE — Progress Notes (Signed)
Patient off vent at 5 am per request. Placed back on 28% trach collar

## 2022-01-05 NOTE — TOC Progression Note (Addendum)
Transition of Care [facility]) - Progression Note    Patient Details  Name: Gary [name] MRN: [id number] Date of Birth: 1955-06-26  Transition of Care [facility]) CM/SW [place], LCSW Phone Number: 01/05/2022, 9:55 AM  Clinical Narrative:    CSW notified Rob with [facility] that patient should DC tomorrow.  Rob reported they are rebuilding patient's staffing team due to previous staffing not feeling welcome in the home.  Rob requested DC Summary and [place] Resumption Orders be faxed to him at [phone] when patient is discharged.   3:40- Per RN patient worried about having transport home tomorrow. Needs EMS. Called [facility], they stated they cannot arrange transport for tomorrow, TOC will need to call tomorrow to arrange.   4:14- Call from [facility] with [employer] who stated they did education at bedside today and they are aware of plan for patient to DC home tomorrow. Thedore Mins stated patient will need to use the Trilogy overnight tonight at the hospital. Thedore Mins requested a call tomorrow when patient does DC so that the [employer] Representative can meet patient at the house to make sure everything is hooked up.    Expected Discharge Plan: [place] Barriers to Discharge: Continued Medical Work up  Expected Discharge Plan and Services Expected Discharge Plan: [place] arrangements for the past 2 months: Single Family Home                                       Social Determinants of Health (SDOH) Interventions    Readmission Risk Interventions    12/31/2021    8:58 AM 12/20/2021    4:21 PM 05/04/2021    1:26 PM  Readmission Risk Prevention Plan  Transportation Screening Complete Complete Complete  Medication Review Press photographer) Complete Complete Complete  PCP or Specialist appointment within 3-5 days of discharge Complete Complete Complete  HRI or Home Care Consult Complete Complete Complete  SW Recovery Care/Counseling Consult  Complete Complete Complete  Palliative Care Screening Complete Complete Not [place] Not Applicable Not Applicable Not Applicable

## 2022-01-05 NOTE — Progress Notes (Signed)
Requested PCP notes.  

## 2022-01-06 ENCOUNTER — Inpatient Hospital Stay: Payer: Medicaid Other

## 2022-01-06 NOTE — TOC Transition Note (Signed)
Transition of Care [facility]) - CM/SW Discharge Note   Patient Details  Name: Gary [name] MRN: [id number] Date of Birth: 1955/12/18  Transition of Care [facility]) CM/SW Contact:  Magnus Ivan, LCSW Phone Number: 01/06/2022, 9:56 AM   Clinical Narrative:    Patient to DC home today.  EMS paperwork completed, called [facility] and patient is next on the list pending truck availability- RN aware.  Faxed DC Summary and [place] resumption orders to Rob with [place]. Attempted call to Rob for start of care date, no answer, left VM and asked him to contact patient with start of care date. Rob is aware of DC today as CSW informed Rob yesterday. CSW left VM for Zach with [employer] to inform him patient is DC and EMS has been called.    Final next level of care: Home w Home Health Services Barriers to Discharge: Barriers Resolved   Patient Goals and CMS Choice        Discharge Placement                Patient to be transferred to facility by: [facility] Name of family member notified: patient aware Patient and family notified of of transfer: 01/06/22  Discharge Plan and Services                DME Arranged: Other see comment (Trilogy) DME Agency: [employer] Date DME Agency Contacted: 01/06/22   Representative spoke with at DME Agency: Thedore Mins     Date [place]: 01/06/22   Representative spoke with at [place]: [place] Determinants of Health ([place]) Interventions     Readmission Risk Interventions    12/31/2021    8:58 AM 12/20/2021    4:21 PM 05/04/2021    1:26 PM  Readmission Risk Prevention Plan  Transportation Screening Complete Complete Complete  Medication Review ([place]) Complete Complete Complete  PCP or Specialist appointment within 3-5 days of discharge Complete Complete Complete  HRI or Home Care Consult Complete Complete Complete  SW Recovery Care/Counseling Consult Complete Complete Complete  Palliative Care Screening Complete Complete  Not [place] Not Applicable Not Applicable Not Applicable
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
## 2022-01-06 NOTE — Plan of Care (Signed)
  Problem: Activity: Goal: Ability to tolerate increased activity will improve Outcome: Progressing   Problem: Respiratory: Goal: Ability to maintain a clear airway and adequate ventilation will improve Outcome: Progressing   Problem: Health Behavior/Discharge Planning: Goal: Ability to manage health-related needs will improve Outcome: Progressing   Problem: Clinical Measurements: Goal: Will remain free from infection Outcome: Progressing Goal: Diagnostic test results will improve Outcome: Progressing Goal: Respiratory complications will improve Outcome: Progressing Goal: Cardiovascular complication will be avoided Outcome: Progressing   Problem: Nutrition: Goal: Adequate nutrition will be maintained Outcome: Progressing   Problem: Coping: Goal: Level of anxiety will decrease Outcome: Progressing   Problem: Elimination: Goal: Will not experience complications related to bowel motility Outcome: Progressing Goal: Will not experience complications related to urinary retention Outcome: Progressing   Problem: Safety: Goal: Ability to remain free from injury will improve Outcome: Progressing

## 2022-01-06 NOTE — Progress Notes (Signed)
Met patient while rounding. He talked about the length of time he has been hospitalized and the desire to go home. Ended visit with prayer for him. Provided presence and support for this patient.

## 2022-01-08 ENCOUNTER — Telehealth (HOSPITAL_COMMUNITY): Payer: Self-pay

## 2022-01-08 NOTE — Telephone Encounter (Signed)
Contacted Gary [name] today, he states using trilogy every night since been home.  He states dont feel as tired as he did the other times.  He answered the phone when I called.  He states Alvis Lemmings says they may have a nurse that will come out tomorrow and Friday.  He is aware of appts, he wants to know if he can use EMS, will check for him.  Will make a home visit this week to check on him.   [place] [phone]

## 2022-01-10 ENCOUNTER — Encounter (HOSPITAL_COMMUNITY): Payer: Self-pay

## 2022-01-10 NOTE — Progress Notes (Signed)
Setup transportation by EMS for his cardiology appt on Oct 2.  He has been in communication with me about his appts.   [place] [phone]

## 2022-01-11 ENCOUNTER — Telehealth: Payer: Self-pay | Admitting: Licensed Clinical Social Worker

## 2022-01-11 ENCOUNTER — Encounter (HOSPITAL_COMMUNITY): Payer: Self-pay

## 2022-01-11 ENCOUNTER — Telehealth (HOSPITAL_COMMUNITY): Payer: Self-pay

## 2022-01-11 NOTE — Telephone Encounter (Signed)
Received a phone call from Gary [name] stating he needs another service to come in daily to help him.  He advised his girlfriend is moving out.  He states his sisters are aware.  He states Gary [name] came yesterday but will not return til Saturday.  He is unable to care for his self he said.  Contacted Armed forces operational officer in [place] for advice and she advised to contact Gary [name], spoke with him and he had no advice either but he states will [facility] with Gary [name] to see if they could go out more often.  Contacted Gary [name] back and advised him he will have to go back to the hospital for his own safety if he does not get help.  He appears to understand but does not like the choice.  3 APS reports have been made from here at EMS and no social worker has been there yet. He has refused Palliative care through [facility].    [place] [phone]
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
## 2022-01-11 NOTE — Progress Notes (Signed)
Gary [name] contacted me again and advised me to call his sister Gary [name].  Spoke with Gary [name] and her plan is to have a meeting with Gary [name] and his girlfriend tomorrow.  As far as she knows the girlfriend is leaving and she is telling Gary [name] he needs to go back to ER and go to a home somewhere.  Gary [name] is aware to go to ED if girlfriend leaves, he states he feels good and wants to have PT to be able to atleast walk with a walker.  He states PT is not coming out.  Gary [name] advised Gary [name] said they never got an order for PT.  Gary [name] and Gary [name] advised will let me know how the meeting goes and what they are gong to do.  Gary [name] is good going back to ED but to get PT and go home if able to.  He is wanting to get better. He now realizes trilogy makes him feel a lot better, he states has used it every night.   [place] [phone]

## 2022-01-15 ENCOUNTER — Ambulatory Visit: Payer: Medicaid Other | Admitting: Physician Assistant

## 2022-01-15 ENCOUNTER — Ambulatory Visit: Payer: Medicaid Other | Attending: Physician Assistant | Admitting: Cardiology

## 2022-01-15 ENCOUNTER — Encounter: Payer: Self-pay | Admitting: Cardiology

## 2022-01-15 VITALS — BP 110/66 | HR 77 | Ht 75.0 in

## 2022-01-15 DIAGNOSIS — E785 Hyperlipidemia, unspecified: Secondary | ICD-10-CM

## 2022-01-15 DIAGNOSIS — J9612 Chronic respiratory failure with hypercapnia: Secondary | ICD-10-CM

## 2022-01-15 DIAGNOSIS — I5033 Acute on chronic diastolic (congestive) heart failure: Secondary | ICD-10-CM

## 2022-01-15 DIAGNOSIS — J9611 Chronic respiratory failure with hypoxia: Secondary | ICD-10-CM | POA: Diagnosis not present

## 2022-01-15 DIAGNOSIS — I10 Essential (primary) hypertension: Secondary | ICD-10-CM | POA: Diagnosis not present

## 2022-01-15 DIAGNOSIS — N1831 Chronic kidney disease, stage 3a: Secondary | ICD-10-CM

## 2022-01-15 DIAGNOSIS — Z91148 Patient's other noncompliance with medication regimen for other reason: Secondary | ICD-10-CM
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
## 2022-01-15 NOTE — Patient Instructions (Signed)
Medication Instructions:   Your physician recommends that you continue on your current medications as directed. Please refer to the Current Medication list given to you today.  *If you need a refill on your cardiac medications before your next appointment, please call your pharmacy*   Lab Work:  Your physician recommends that you return for lab work at the medical mall at your earliest convenience No appt is needed. Hours are M-F 7AM- 6 PM.  If you have labs (blood work) drawn today and your tests are completely normal, you will receive your results only by: [place] (if you have MyChart) OR A paper copy in the mail If you have any lab test that is abnormal or we need to change your treatment, we will call you to review the results.   Testing/Procedures:  None Ordered   Follow-Up: At [facility], you and your health needs are our priority.  As part of our continuing mission to provide you with exceptional heart care, we have created designated Provider Care Teams.  These Care Teams include your primary Cardiologist (physician) and Advanced Practice Providers (APPs -  Physician Assistants and Nurse Practitioners) who all work together to provide you with the care you need, when you need it.  We recommend signing up for the patient portal called "MyChart".  Sign up information is provided on this After Visit Summary.  MyChart is used to connect with patients for Virtual Visits (Telemedicine).  Patients are able to view lab/test results, encounter notes, upcoming appointments, etc.  Non-urgent messages can be sent to your provider as well.   To learn more about what you can do with MyChart, go to [email or web address].    Your next appointment:   2 month(s)  The format for your next appointment:   In Person  Provider:   You may see Ida Rogue, MD or one of the following Advanced Practice Providers on your designated Care Team:   Murray Hodgkins, NP Christell Faith, PA-C Cadence Kathlen Mody, PA-C Gerrie Nordmann, NP     Important Information About [place]

## 2022-01-17 ENCOUNTER — Encounter (HOSPITAL_COMMUNITY): Payer: Self-pay
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
## 2022-01-19 ENCOUNTER — Encounter: Payer: Self-pay | Admitting: Emergency Medicine

## 2022-01-19 ENCOUNTER — Emergency Department
Admission: EM | Admit: 2022-01-19 | Discharge: 2022-01-20 | Disposition: A | Discharge status: Home / Self Care | Payer: Medicaid Other | Attending: Emergency Medicine | Admitting: Emergency Medicine

## 2022-01-19 ENCOUNTER — Emergency Department: Payer: Medicaid Other

## 2022-01-19 ENCOUNTER — Other Ambulatory Visit: Payer: Self-pay

## 2022-01-19 DIAGNOSIS — R0789 Other chest pain: Secondary | ICD-10-CM | POA: Insufficient documentation

## 2022-01-19 DIAGNOSIS — Z20822 Contact with and (suspected) exposure to covid-19: Secondary | ICD-10-CM | POA: Diagnosis not present

## 2022-01-19 DIAGNOSIS — Z430 Encounter for attention to tracheostomy: Secondary | ICD-10-CM | POA: Diagnosis not present

## 2022-01-19 DIAGNOSIS — R059 Cough, unspecified: Secondary | ICD-10-CM | POA: Insufficient documentation

## 2022-01-19 LAB — CBC
HCT: 39.7 % (ref 39.0–52.0)
Hemoglobin: 10.4 g/dL — ABNORMAL LOW (ref 13.0–17.0)
MCH: 21.4 pg — ABNORMAL LOW (ref 26.0–34.0)
MCHC: 26.2 g/dL — ABNORMAL LOW (ref 30.0–36.0)
MCV: 81.9 fL (ref 80.0–100.0)
Platelets: 226 10*3/uL (ref 150–400)
RBC: 4.85 MIL/uL (ref 4.22–5.81)
RDW: 18.5 % — ABNORMAL HIGH (ref 11.5–15.5)
WBC: 5.6 10*3/uL (ref 4.0–10.5)
nRBC: 0 % (ref 0.0–0.2)

## 2022-01-19 LAB — BASIC METABOLIC PANEL
Anion gap: 5 (ref 5–15)
BUN: 20 mg/dL (ref 8–23)
CO2: 32 mmol/L (ref 22–32)
Calcium: 8.9 mg/dL (ref 8.9–10.3)
Chloride: 107 mmol/L (ref 98–111)
Creatinine, Ser: 1.15 mg/dL (ref 0.61–1.24)
GFR, Estimated: >60 mL/min (ref >60)
Glucose, Bld: 96 mg/dL (ref 70–99)
Potassium: 3.8 mmol/L (ref 3.5–5.1)
Sodium: 144 mmol/L (ref 135–145)

## 2022-01-19 LAB — TROPONIN I (HIGH SENSITIVITY): Troponin I (High Sensitivity): 6 ng/L (ref <18)

## 2022-01-19 LAB — RESP PANEL BY RT-PCR (FLU A&B, COVID) ARPGX2
Influenza A by PCR: NEGATIVE
Influenza B by PCR: NEGATIVE
SARS Coronavirus 2 by RT PCR: NEGATIVE

## 2022-01-19 NOTE — ED Notes (Signed)
Warm blankets provided. Head adjusted.

## 2022-01-19 NOTE — ED Notes (Signed)
Pt yelling out again, more po fluids provided. Male purewick placed for possible urination.

## 2022-01-19 NOTE — ED Notes (Signed)
Pt yelling out again, repositioned in bed for comfort.

## 2022-01-19 NOTE — Discharge Instructions (Signed)
Please seek medical attention for any high fevers, chest pain, shortness of breath, change in behavior, persistent vomiting, bloody stool or any other new or concerning symptoms.  

## 2022-01-19 NOTE — ED Provider Notes (Signed)
[facility] Provider Note    Event Date/Time   First MD Initiated Contact with Patient 01/19/22 2125     (approximate)   History   Increased trach secretions.    HPI  Gary [name] is a 66 y.o. male  who presents to the emergency department today with primary concern for increased trach secretions.  He says he has been having issues with his trach since getting it placed this summer, however has noticed increased secretions recently. Has been accompanied by cough and some chest discomfort. The patient denies any fevers. Denies any known sick contacts.     Physical Exam   Triage Vital Signs: ED Triage Vitals  Enc Vitals Group     BP 01/19/22 2050 115/72     Pulse Rate 01/19/22 2050 80     Resp 01/19/22 2050 16     Temp 01/19/22 2050 97.9 F (36.6 C)     Temp Source 01/19/22 2050 Oral     SpO2 01/19/22 2050 94 %     Weight 01/19/22 2048 (!) 378 lb 1.4 oz (171.5 kg)     Height 01/19/22 2048 '6\' 3"'$  (1.905 m)     Head Circumference --      Peak Flow --      Pain Score 01/19/22 2048 6     Pain Loc --      Pain Edu? --      Excl. in [place]? --     Most recent vital signs: Vitals:   01/19/22 2050 01/19/22 2118  BP: 115/72   Pulse: 80 78  Resp: 16 (!) 22  Temp: 97.9 F (36.6 C)   SpO2: 94% 95%    General: Awake, alert, oriented. CV:  Good peripheral perfusion. Regular rate and rhythm. Resp:  Normal effort. Lungs clear. Abd:  No distention.  Other:  Trach in place.    ED Results / Procedures / Treatments   Labs (all labs ordered are listed, but only abnormal results are displayed) Labs Reviewed  CBC - Abnormal; Notable for the following components:      Result Value   Hemoglobin 10.4 (*)    MCH 21.4 (*)    MCHC 26.2 (*)    RDW 18.5 (*)    All other components within normal limits  RESP PANEL BY RT-PCR (FLU A&B, COVID) ARPGX2  BASIC METABOLIC PANEL  TROPONIN I (HIGH SENSITIVITY)      RADIOLOGY I independently interpreted and  visualized the CXR. My interpretation: cardiomegaly Radiology interpretation:  IMPRESSION:  1. Cardiomegaly with mild pulmonary vascular congestion.  2. Mild left basilar atelectasis.      PROCEDURES:  Critical Care performed: No  Procedures   MEDICATIONS ORDERED IN ED: Medications - No data to display   IMPRESSION / MDM / [place] / ED COURSE  I reviewed the triage vital signs and the nursing notes.                              Differential diagnosis includes, but is not limited to, pneumonia, covid.  Patient's presentation is most consistent with acute presentation with potential threat to life or bodily function.  Patient presented to the emergency department today because of concerns for increased secretions to his tracheostomy.  On exam here patient in no respiratory distress.  X-ray without any pneumonia.  Patient afebrile without any significant leukocytosis.  At this time I doubt tracheitis.  I did  discuss this findings with patient.  At this time I think is reasonable for patient be discharged home.  Patient stated that he felt he needed to be admitted.  When asked why he said so he can have his tracheostomy tube removed.  I did discuss with patient that this would be something he would have to follow-up with as an outpatient.  Additionally patient requesting home PT and home health.  Will order face-to-face.   FINAL CLINICAL IMPRESSION(S) / ED DIAGNOSES   Final diagnoses:  Tracheostomy care [facility])     Note:  This document was prepared using Dragon voice recognition software and may include unintentional dictation errors.    Nance Pear, MD 01/19/22 [phone]

## 2022-01-19 NOTE — ED Notes (Signed)
RT down to set up humidified oxygen via trach collar. Pt has his passymuir valve in place.

## 2022-01-19 NOTE — ED Notes (Signed)
Pt yelling out "nurse" again. Pt asking for a kleenex. Kleenex provided.

## 2022-01-19 NOTE — ED Notes (Signed)
Pt with near constant requests. Po fluids provided with md consent.

## 2022-01-19 NOTE — ED Notes (Signed)
Pt yelling out "nurse" again. Pt states he wants to be repositioned in bed. Request provided.

## 2022-01-19 NOTE — ED Notes (Signed)
Pt yelling out again "nurse". Pt requesting feet to be covered. Asked pt "is there anything else I can get you while I am in here, I have been in here twenty one times in an hour and a half and I have other patients that also need care". Pt states others can wait, he also has needs. Pt covered, declines further needs at this time.

## 2022-01-19 NOTE — Progress Notes (Signed)
Placed pt on 28% trach collar with humidity. O2 sat 96%

## 2022-01-19 NOTE — ED Notes (Signed)
[facility] to transport pt to home 

## 2022-01-19 NOTE — ED Triage Notes (Signed)
Pt to ED via [facility] from home with c/o difficulty breathing and increased secretions from his trach. Per EMS pt had trach placed in Aug and for the past week has had increased secretions and difficulty breathing.   100% on 6L via trach collar  Per EMS pt has been coughing secretions up, noted to be dark in nature. Per EMS pt baseline 6L via trach collar, sleeps with a vent at home. Per EMS 89% at home, 95% when switched to EMS O2. Pt noted to have passey muir valve on en route.   79HR 95% 6L via trach collar 113/61 98.2

## 2022-01-19 NOTE — ED Notes (Signed)
Pt repositioned in bed with 4 person assist.

## 2022-01-20 NOTE — ED Notes (Signed)
Pt yelling out again, demanding ems "hurry up, this bed is killing me". Pt again repositioned in bed for comfort.

## 2022-01-23 ENCOUNTER — Encounter: Payer: Self-pay | Admitting: Emergency Medicine

## 2022-01-23 ENCOUNTER — Encounter (HOSPITAL_COMMUNITY): Payer: Self-pay

## 2022-01-23 ENCOUNTER — Emergency Department: Payer: Medicaid Other

## 2022-01-23 ENCOUNTER — Other Ambulatory Visit: Payer: Self-pay

## 2022-01-23 ENCOUNTER — Inpatient Hospital Stay
Admission: EM | Admit: 2022-01-23 | Discharge: 2022-04-06 | DRG: 291 | Disposition: A | Discharge status: Home Health | Payer: Medicaid Other | Attending: Hospitalist | Admitting: Hospitalist

## 2022-01-23 DIAGNOSIS — I2510 Atherosclerotic heart disease of native coronary artery without angina pectoris: Secondary | ICD-10-CM | POA: Diagnosis present

## 2022-01-23 DIAGNOSIS — Z8616 Personal history of COVID-19: Secondary | ICD-10-CM

## 2022-01-23 DIAGNOSIS — Z87891 Personal history of nicotine dependence: Secondary | ICD-10-CM

## 2022-01-23 DIAGNOSIS — Z1152 Encounter for screening for COVID-19: Secondary | ICD-10-CM

## 2022-01-23 DIAGNOSIS — Z7901 Long term (current) use of anticoagulants: Secondary | ICD-10-CM

## 2022-01-23 DIAGNOSIS — Z751 Person awaiting admission to adequate facility elsewhere: Secondary | ICD-10-CM

## 2022-01-23 DIAGNOSIS — Y838 Other surgical procedures as the cause of abnormal reaction of the patient, or of later complication, without mention of misadventure at the time of the procedure: Secondary | ICD-10-CM | POA: Diagnosis present

## 2022-01-23 DIAGNOSIS — F419 Anxiety disorder, unspecified: Secondary | ICD-10-CM | POA: Diagnosis present

## 2022-01-23 DIAGNOSIS — Z930 Tracheostomy status: Secondary | ICD-10-CM

## 2022-01-23 DIAGNOSIS — K5900 Constipation, unspecified: Secondary | ICD-10-CM | POA: Diagnosis not present

## 2022-01-23 DIAGNOSIS — J9501 Hemorrhage from tracheostomy stoma: Secondary | ICD-10-CM | POA: Diagnosis present

## 2022-01-23 DIAGNOSIS — I509 Heart failure, unspecified: Principal | ICD-10-CM

## 2022-01-23 DIAGNOSIS — Z6841 Body Mass Index (BMI) 40.0 and over, adult: Secondary | ICD-10-CM

## 2022-01-23 DIAGNOSIS — R0602 Shortness of breath: Secondary | ICD-10-CM

## 2022-01-23 DIAGNOSIS — M23200 Derangement of unspecified lateral meniscus due to old tear or injury, right knee: Secondary | ICD-10-CM | POA: Diagnosis present

## 2022-01-23 DIAGNOSIS — I110 Hypertensive heart disease with heart failure: Principal | ICD-10-CM | POA: Diagnosis present

## 2022-01-23 DIAGNOSIS — R609 Edema, unspecified: Secondary | ICD-10-CM

## 2022-01-23 DIAGNOSIS — Z7951 Long term (current) use of inhaled steroids: Secondary | ICD-10-CM

## 2022-01-23 DIAGNOSIS — Z79899 Other long term (current) drug therapy: Secondary | ICD-10-CM

## 2022-01-23 DIAGNOSIS — E876 Hypokalemia: Secondary | ICD-10-CM | POA: Diagnosis present

## 2022-01-23 DIAGNOSIS — J810 Acute pulmonary edema: Secondary | ICD-10-CM

## 2022-01-23 DIAGNOSIS — T17890A Other foreign object in other parts of respiratory tract causing asphyxiation, initial encounter: Secondary | ICD-10-CM | POA: Diagnosis present

## 2022-01-23 DIAGNOSIS — M23203 Derangement of unspecified medial meniscus due to old tear or injury, right knee: Secondary | ICD-10-CM | POA: Diagnosis present

## 2022-01-23 DIAGNOSIS — J449 Chronic obstructive pulmonary disease, unspecified: Secondary | ICD-10-CM | POA: Diagnosis present

## 2022-01-23 DIAGNOSIS — F32A Depression, unspecified: Secondary | ICD-10-CM | POA: Diagnosis present

## 2022-01-23 DIAGNOSIS — E785 Hyperlipidemia, unspecified: Secondary | ICD-10-CM | POA: Diagnosis present

## 2022-01-23 DIAGNOSIS — Z9049 Acquired absence of other specified parts of digestive tract: Secondary | ICD-10-CM

## 2022-01-23 DIAGNOSIS — Z91199 Patient's noncompliance with other medical treatment and regimen due to unspecified reason: Secondary | ICD-10-CM

## 2022-01-23 DIAGNOSIS — N179 Acute kidney failure, unspecified: Secondary | ICD-10-CM | POA: Diagnosis present

## 2022-01-23 DIAGNOSIS — G4730 Sleep apnea, unspecified: Secondary | ICD-10-CM | POA: Diagnosis present

## 2022-01-23 DIAGNOSIS — J9621 Acute and chronic respiratory failure with hypoxia: Secondary | ICD-10-CM | POA: Diagnosis present

## 2022-01-23 DIAGNOSIS — J9509 Other tracheostomy complication: Secondary | ICD-10-CM | POA: Diagnosis present

## 2022-01-23 DIAGNOSIS — M1711 Unilateral primary osteoarthritis, right knee: Secondary | ICD-10-CM | POA: Diagnosis present

## 2022-01-23 DIAGNOSIS — L7622 Postprocedural hemorrhage and hematoma of skin and subcutaneous tissue following other procedure: Secondary | ICD-10-CM | POA: Diagnosis present

## 2022-01-23 DIAGNOSIS — Z8711 Personal history of peptic ulcer disease: Secondary | ICD-10-CM

## 2022-01-23 DIAGNOSIS — I5033 Acute on chronic diastolic (congestive) heart failure: Secondary | ICD-10-CM | POA: Diagnosis present

## 2022-01-23 DIAGNOSIS — K219 Gastro-esophageal reflux disease without esophagitis: Secondary | ICD-10-CM | POA: Diagnosis present

## 2022-01-23 DIAGNOSIS — D509 Iron deficiency anemia, unspecified: Secondary | ICD-10-CM | POA: Diagnosis present

## 2022-01-23 DIAGNOSIS — N400 Enlarged prostate without lower urinary tract symptoms: Secondary | ICD-10-CM | POA: Diagnosis present

## 2022-01-23 DIAGNOSIS — J9622 Acute and chronic respiratory failure with hypercapnia: Secondary | ICD-10-CM | POA: Diagnosis present

## 2022-01-23 DIAGNOSIS — Z23 Encounter for immunization: Secondary | ICD-10-CM

## 2022-01-23 LAB — CBC WITH DIFFERENTIAL/PLATELET
Abs Immature Granulocytes: 0.01 10*3/uL (ref 0.00–0.07)
Basophils Absolute: 0 10*3/uL (ref 0.0–0.1)
Basophils Relative: 0 %
Eosinophils Absolute: 0.2 10*3/uL (ref 0.0–0.5)
Eosinophils Relative: 3 %
HCT: 39.7 % (ref 39.0–52.0)
Hemoglobin: 10.8 g/dL — ABNORMAL LOW (ref 13.0–17.0)
Immature Granulocytes: 0 %
Lymphocytes Relative: 22 %
Lymphs Abs: 1.5 10*3/uL (ref 0.7–4.0)
MCH: 21.6 pg — ABNORMAL LOW (ref 26.0–34.0)
MCHC: 27.2 g/dL — ABNORMAL LOW (ref 30.0–36.0)
MCV: 79.6 fL — ABNORMAL LOW (ref 80.0–100.0)
Monocytes Absolute: 0.6 10*3/uL (ref 0.1–1.0)
Monocytes Relative: 9 %
Neutro Abs: 4.3 10*3/uL (ref 1.7–7.7)
Neutrophils Relative %: 66 %
Platelets: 202 10*3/uL (ref 150–400)
RBC: 4.99 MIL/uL (ref 4.22–5.81)
RDW: 18.3 % — ABNORMAL HIGH (ref 11.5–15.5)
WBC: 6.5 10*3/uL (ref 4.0–10.5)
nRBC: 0.6 % — ABNORMAL HIGH (ref 0.0–0.2)

## 2022-01-23 LAB — COMPREHENSIVE METABOLIC PANEL
ALT: 9 U/L (ref 0–44)
AST: 10 U/L — ABNORMAL LOW (ref 15–41)
Albumin: 3.1 g/dL — ABNORMAL LOW (ref 3.5–5.0)
Alkaline Phosphatase: 81 U/L (ref 38–126)
Anion gap: 7 (ref 5–15)
BUN: 26 mg/dL — ABNORMAL HIGH (ref 8–23)
CO2: 34 mmol/L — ABNORMAL HIGH (ref 22–32)
Calcium: 8.7 mg/dL — ABNORMAL LOW (ref 8.9–10.3)
Chloride: 101 mmol/L (ref 98–111)
Creatinine, Ser: 1.13 mg/dL (ref 0.61–1.24)
GFR, Estimated: >60 mL/min (ref >60)
Glucose, Bld: 89 mg/dL (ref 70–99)
Potassium: 3.4 mmol/L — ABNORMAL LOW (ref 3.5–5.1)
Sodium: 142 mmol/L (ref 135–145)
Total Bilirubin: 0.5 mg/dL (ref 0.3–1.2)
Total Protein: 7.6 g/dL (ref 6.5–8.1)

## 2022-01-23 LAB — URINALYSIS, ROUTINE W REFLEX MICROSCOPIC
Bilirubin Urine: NEGATIVE
Glucose, UA: NEGATIVE mg/dL
Hgb urine dipstick: NEGATIVE
Ketones, ur: NEGATIVE mg/dL
Leukocytes,Ua: NEGATIVE
Nitrite: NEGATIVE
Protein, ur: NEGATIVE mg/dL
Specific Gravity, Urine: 1.017 (ref 1.005–1.030)
pH: 5 (ref 5.0–8.0)

## 2022-01-23 LAB — RESP PANEL BY RT-PCR (FLU A&B, COVID) ARPGX2
Influenza A by PCR: NEGATIVE
Influenza B by PCR: NEGATIVE
SARS Coronavirus 2 by RT PCR: NEGATIVE

## 2022-01-23 LAB — BRAIN NATRIURETIC PEPTIDE: B Natriuretic Peptide: 23.1 pg/mL (ref 0.0–100.0)

## 2022-01-23 LAB — TROPONIN I (HIGH SENSITIVITY): Troponin I (High Sensitivity): 6 ng/L (ref <18)

## 2022-01-23 MED ORDER — MOMETASONE FURO-FORMOTEROL FUM 200-5 MCG/ACT IN AERO
2.0000 | INHALATION_SPRAY | Freq: Two times a day (BID) | RESPIRATORY_TRACT | Status: DC
  Administered 2022-01-23 – 2022-02-21 (×57): 2 via RESPIRATORY_TRACT
  Filled 2022-01-23 (×2): qty 8.8

## 2022-01-23 MED ORDER — CLONAZEPAM 0.5 MG PO TBDP
0.5000 mg | ORAL_TABLET | Freq: Two times a day (BID) | ORAL | Status: DC | PRN
  Administered 2022-01-23 – 2022-04-05 (×65): 0.5 mg via ORAL
  Filled 2022-01-23: qty 2
  Filled 2022-01-23 (×6): qty 1
  Filled 2022-01-23: qty 2
  Filled 2022-01-23: qty 1
  Filled 2022-01-23: qty 2
  Filled 2022-01-23 (×17): qty 1
  Filled 2022-01-23: qty 2
  Filled 2022-01-23 (×5): qty 1
  Filled 2022-01-23: qty 2
  Filled 2022-01-23 (×4): qty 1
  Filled 2022-01-23: qty 2
  Filled 2022-01-23: qty 1
  Filled 2022-01-23 (×3): qty 2
  Filled 2022-01-23 (×8): qty 1
  Filled 2022-01-23: qty 2
  Filled 2022-01-23 (×2): qty 1
  Filled 2022-01-23: qty 2
  Filled 2022-01-23 (×7): qty 1
  Filled 2022-01-23 (×2): qty 2
  Filled 2022-01-23 (×4): qty 1

## 2022-01-23 MED ORDER — PANTOPRAZOLE SODIUM 40 MG PO TBEC
40.0000 mg | DELAYED_RELEASE_TABLET | Freq: Two times a day (BID) | ORAL | Status: DC
  Administered 2022-01-24 – 2022-02-24 (×62): 40 mg via ORAL
  Filled 2022-01-23 (×62): qty 1

## 2022-01-23 MED ORDER — POTASSIUM CHLORIDE CRYS ER 20 MEQ PO TBCR
10.0000 meq | EXTENDED_RELEASE_TABLET | Freq: Every day | ORAL | Status: DC
  Administered 2022-01-23 – 2022-04-06 (×70): 10 meq via ORAL
  Filled 2022-01-23 (×73): qty 1

## 2022-01-23 MED ORDER — BUDESONIDE 0.25 MG/2ML IN SUSP
0.2500 mg | Freq: Two times a day (BID) | RESPIRATORY_TRACT | Status: DC
  Administered 2022-01-23 – 2022-02-04 (×22): 0.25 mg via RESPIRATORY_TRACT
  Filled 2022-01-23 (×23): qty 2

## 2022-01-23 MED ORDER — BENZONATATE 100 MG PO CAPS
100.0000 mg | ORAL_CAPSULE | Freq: Three times a day (TID) | ORAL | Status: DC | PRN
  Administered 2022-01-29 – 2022-03-28 (×4): 100 mg via ORAL
  Filled 2022-01-23 (×4): qty 1

## 2022-01-23 MED ORDER — IPRATROPIUM-ALBUTEROL 20-100 MCG/ACT IN AERS
2.0000 | INHALATION_SPRAY | Freq: Two times a day (BID) | RESPIRATORY_TRACT | Status: DC
  Administered 2022-01-23 – 2022-03-27 (×81): 2 via RESPIRATORY_TRACT
  Filled 2022-01-23 (×3): qty 4

## 2022-01-23 MED ORDER — SIMVASTATIN 20 MG PO TABS
10.0000 mg | ORAL_TABLET | Freq: Every day | ORAL | Status: DC
  Administered 2022-01-24 – 2022-04-05 (×68): 10 mg via ORAL
  Filled 2022-01-23 (×70): qty 1

## 2022-01-23 MED ORDER — TORSEMIDE 20 MG PO TABS
20.0000 mg | ORAL_TABLET | Freq: Every day | ORAL | Status: DC
  Administered 2022-01-23 – 2022-02-02 (×11): 20 mg via ORAL
  Filled 2022-01-23 (×11): qty 1

## 2022-01-23 MED ORDER — GABAPENTIN 400 MG PO CAPS
400.0000 mg | ORAL_CAPSULE | Freq: Three times a day (TID) | ORAL | Status: DC
  Administered 2022-01-23 – 2022-02-20 (×84): 400 mg via ORAL
  Filled 2022-01-23 (×87): qty 1

## 2022-01-23 MED ORDER — ACETAMINOPHEN 500 MG PO TABS
1000.0000 mg | ORAL_TABLET | Freq: Once | ORAL | Status: AC
  Administered 2022-01-23: 1000 mg via ORAL
  Filled 2022-01-23: qty 2

## 2022-01-23 MED ORDER — MELATONIN 5 MG PO TABS
5.0000 mg | ORAL_TABLET | Freq: Every day | ORAL | Status: DC
  Administered 2022-01-23 – 2022-04-05 (×67): 5 mg via ORAL
  Filled 2022-01-23 (×69): qty 1

## 2022-01-23 MED ORDER — ESCITALOPRAM OXALATE 10 MG PO TABS
5.0000 mg | ORAL_TABLET | Freq: Every day | ORAL | Status: DC
  Administered 2022-01-23 – 2022-03-27 (×64): 5 mg via ORAL
  Filled 2022-01-23 (×5): qty 0.5
  Filled 2022-01-23: qty 1
  Filled 2022-01-23 (×7): qty 0.5
  Filled 2022-01-23: qty 1
  Filled 2022-01-23: qty 0.5
  Filled 2022-01-23: qty 1
  Filled 2022-01-23 (×8): qty 0.5
  Filled 2022-01-23: qty 1
  Filled 2022-01-23 (×5): qty 0.5
  Filled 2022-01-23 (×2): qty 1
  Filled 2022-01-23 (×13): qty 0.5
  Filled 2022-01-23: qty 1
  Filled 2022-01-23 (×6): qty 0.5
  Filled 2022-01-23: qty 1
  Filled 2022-01-23: qty 0.5
  Filled 2022-01-23 (×2): qty 1
  Filled 2022-01-23 (×12): qty 0.5
  Filled 2022-01-23 (×2): qty 1
  Filled 2022-01-23 (×2): qty 0.5
  Filled 2022-01-23: qty 1

## 2022-01-23 MED ORDER — QUETIAPINE FUMARATE 25 MG PO TABS
25.0000 mg | ORAL_TABLET | Freq: Two times a day (BID) | ORAL | Status: DC | PRN
  Administered 2022-01-26 – 2022-02-16 (×7): 25 mg via ORAL
  Filled 2022-01-23 (×8): qty 1

## 2022-01-23 MED ORDER — APIXABAN 2.5 MG PO TABS
2.5000 mg | ORAL_TABLET | Freq: Two times a day (BID) | ORAL | Status: DC
  Administered 2022-01-23 – 2022-04-06 (×145): 2.5 mg via ORAL
  Filled 2022-01-23 (×146): qty 1

## 2022-01-23 MED ORDER — FERROUS SULFATE 325 (65 FE) MG PO TABS
325.0000 mg | ORAL_TABLET | Freq: Two times a day (BID) | ORAL | Status: DC
  Administered 2022-01-24 – 2022-04-06 (×136): 325 mg via ORAL
  Filled 2022-01-23 (×137): qty 1

## 2022-01-23 MED ORDER — IPRATROPIUM-ALBUTEROL 0.5-2.5 (3) MG/3ML IN SOLN
3.0000 mL | Freq: Four times a day (QID) | RESPIRATORY_TRACT | Status: DC | PRN
  Administered 2022-01-27 – 2022-02-03 (×3): 3 mL via RESPIRATORY_TRACT
  Filled 2022-01-23 (×2): qty 3

## 2022-01-23 NOTE — Progress Notes (Signed)
Patient self placed on home vent. 4L oxygen bleed-in added to patient vent circuit due to desat, mid 70-80s. SpO2 improved to 94%. Patient home unit plugged into red outlets. All equipment appears to be in proper working condition.

## 2022-01-23 NOTE — Progress Notes (Signed)
RT to patient bedside for trach care/assessment. Patient on 5L 28% trach collar tolerating well at this time. Trach care completed at this time. Patient suctioned with small tan secretions returned. Home vent at patient bedside.

## 2022-01-23 NOTE — ED Notes (Signed)
Pt bathed and linen changed, pt had bowel movement, deodorant applied, pt resting comfortably.

## 2022-01-23 NOTE — ED Triage Notes (Signed)
Pt here via acems with c/o shortness of breath & congestion. Pt has trach in place. Pt reports no one has been caring for trach for last 3 days and has been without food for past 2 days.
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
## 2022-01-23 NOTE — ED Provider Notes (Signed)
[facility] Provider Note   Event Date/Time   First MD Initiated Contact with Patient 01/23/22 [phone]     (approximate) History  Shortness of Breath  HPI Gary [name] is a 66 y.o. 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  Patient states that he has a caregiver at home that has not been taking care of him over the last 3 days including not feeding, bathing, or taking care of his tracheostomy.  Patient states that today when he woke up he was severely short of breath until he had a coughing spell and was able to get mucus up. ROS: Patient currently denies any vision changes, tinnitus, difficulty speaking, facial droop, sore throat, chest pain, abdominal pain, nausea/vomiting/diarrhea, dysuria, or weakness/numbness/paresthesias in any extremity   Physical Exam  Triage Vital Signs: ED Triage Vitals [01/23/22 0850]  Enc Vitals Group     BP      Pulse      Resp      Temp      Temp src      SpO2      Weight      Height      Head Circumference      Peak Flow      Pain Score 0     Pain Loc      Pain Edu?      Excl. in [place]?    Most recent vital signs: Vitals:   01/23/22 1230 01/23/22 1300  BP: 117/81 122/87  Pulse: 82 80  Resp: 20 16  Temp:    SpO2: 93% 92%   General: Awake, oriented x4. CV:  Good peripheral perfusion.  Resp:  Normal effort.  Tracheostomy in place with mild rhonchi over bilateral lung fields. Abd:  No distention.  Other:  Middle-aged morbidly obese African-American male laying in bed with tracheostomy in place ED Results / Procedures / Treatments  Labs (all labs ordered are listed, but only abnormal results are displayed) Labs Reviewed  COMPREHENSIVE METABOLIC PANEL - Abnormal; Notable for the following components:      Result Value   Potassium 3.4 (*)    CO2 34 (*)    BUN 26 (*)    Calcium 8.7 (*)    Albumin 3.1 (*)     AST 10 (*)    All other components within normal limits  CBC WITH DIFFERENTIAL/PLATELET - Abnormal; Notable for the following components:   Hemoglobin 10.8 (*)    MCV 79.6 (*)    MCH 21.6 (*)    MCHC 27.2 (*)    RDW 18.3 (*)    nRBC 0.6 (*)    All other components within normal limits  RESP PANEL BY RT-PCR (FLU A&B, COVID) ARPGX2  BRAIN NATRIURETIC PEPTIDE  URINALYSIS, ROUTINE W REFLEX MICROSCOPIC  TROPONIN I (HIGH SENSITIVITY)   EKG ED ECG REPORT I, Naaman Plummer, the attending physician, personally viewed and interpreted this ECG. Date: 01/23/2022 EKG Time: 0856 Rate: 86 Rhythm: normal sinus rhythm QRS Axis: normal Intervals: normal ST/T Wave abnormalities: normal Narrative Interpretation: no evidence of acute ischemia RADIOLOGY ED MD interpretation: Single view portable chest x-ray interpreted by me and shows pulmonary venous congestion without overt pulmonary edema -Agree with radiology assessment Official radiology report(s): DG Chest Port 1 View  Result Date: 01/23/2022 CLINICAL DATA:  Shortness of breath. EXAM: PORTABLE CHEST 1 VIEW COMPARISON:  Chest  radiograph 01/19/2022 FINDINGS: Tracheostomy in place. No pleural effusion. Pneumothorax. Unchanged enlarged cardiac and mediastinal contours. No focal airspace opacity. The prominent interstitial opacities, similar to slightly improved from prior exam. Bibasilar atelectasis. Visualized upper abdomen is unremarkable. No displaced rib fractures. IMPRESSION: Pulmonary venous congestion without overt pulmonary edema. Electronically Signed   By: Marin Roberts M.D.   On: 01/23/2022 09:14   PROCEDURES: Critical Care performed: No Procedures MEDICATIONS ORDERED IN ED: Medications - No data to display IMPRESSION / MDM / [place] / ED COURSE  I reviewed the triage vital signs and the nursing notes.                             The patient is on the cardiac monitor to evaluate for evidence of arrhythmia and/or  significant heart rate changes. Patient's presentation is most consistent with acute presentation with potential threat to life or bodily function. The patient is suffering from shortness of breath, but the immediate cause is not apparent.  Potential causes considered include, but are not limited to, asthma or COPD, intermittent mucous plugging, congestive heart failure, pulmonary embolism, pneumothorax, coronary syndrome, pneumonia, and pleural effusion.  Despite the evaluation including history, exam, and testing, the cause of the shortness of breath remains unclear. However, during the ED stay, patients condition improved, and at the time of discharge the shortness of breath is resolved, they are feeling well, but is worried about going home as he states that his caregiver is not providing adequate care.  Patient is pending social work evaluation at the end of my shift.  Care of this patient will be signed out to the oncoming physician at the end of my shift.  All pertinent patient information conveyed and all questions answered.  All further care and disposition decisions will be made by the oncoming physician.    FINAL CLINICAL IMPRESSION(S) / ED DIAGNOSES   Final diagnoses:  SOB (shortness of breath)   Rx / DC Orders   ED Discharge Orders     None      Note:  This document was prepared using Dragon voice recognition software and may include unintentional dictation errors.   Naaman Plummer, MD 01/23/22 [phone]
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
## 2022-01-24 LAB — CBG MONITORING, ED: Glucose-Capillary: 99 mg/dL (ref 70–99)

## 2022-01-24 MED ORDER — ONDANSETRON HCL 4 MG/2ML IJ SOLN
4.0000 mg | Freq: Once | INTRAMUSCULAR | Status: AC
  Administered 2022-01-24: 4 mg via INTRAVENOUS
  Filled 2022-01-24: qty 2

## 2022-01-24 NOTE — ED Notes (Signed)
Respiratory called and notified pt wanted to go on his vent. Sts they are busy at the moment and will get there as soon as they are able. Pt notified of delay and reasoning.

## 2022-01-24 NOTE — ED Provider Notes (Signed)
Emergency Medicine Observation Re-evaluation Note  Gary [name] is a 66 y.o. male, seen on rounds today.  Pt initially presented to the ED for complaints of Shortness of Breath  Currently, the patient is calm, no acute complaints.  Physical Exam  Blood pressure 112/86, pulse 73, temperature 98.8 F (37.1 C), temperature source Oral, resp. rate 20, SpO2 95 %. Physical Exam General: NAD Lungs: CTAB Psych: not agitated  ED Course / MDM  EKG:    I have reviewed the labs performed to date as well as medications administered while in observation.  Recent changes in the last 24 hours include no acute events overnight.    Plan  Current plan is for [facility] placement   Carrie Mew, MD 01/24/22 [phone]

## 2022-01-24 NOTE — Social Work (Addendum)
CSW followed up with APS worker, Blanch Media, to discuss barriers and recommendations. Per RN note, Blanch Media stated she would visit the pt yesterday. CSW left VM, will attempt to call later if no answer. TOC will continue to follow.   Per previous CSW, APS worker was unable to visit pt yesterday, due to illness, she will try to visit tomorrow. TOC to defer to APS for next steps.

## 2022-01-24 NOTE — Progress Notes (Signed)
Placed PT on his home Trilogy vent with 6L O2 bled in. Tolerating well at this time. SpO2 93%.

## 2022-01-24 NOTE — ED Notes (Signed)
This RN has told patient multiple times to attach home device to help with oxygen levels while resting. Pt acknowledges and agrees but does not complete task. This RN has offered multiple if he would like help, pt denies. Pt states "I took this thing off, so I should be good now".

## 2022-01-24 NOTE — ED Provider Notes (Signed)
-----------------------------------------   3:36 AM on 01/24/2022 -----------------------------------------   Blood pressure 122/79, pulse 77, temperature 98.8 F (37.1 C), temperature source Oral, resp. rate (!) 21, SpO2 96 %.  The patient is calm and cooperative at this time.  There have been no acute events since the last update.  Awaiting disposition plan from case management/social work.    Natallie Ravenscroft, Delice Bison, DO 01/24/22 [phone]

## 2022-01-24 NOTE — ED Notes (Signed)
Patient did not want to take medicine at this time. Stated he wanted to sleep.

## 2022-01-24 NOTE — ED Notes (Signed)
Pt sleeping. 

## 2022-01-24 NOTE — ED Notes (Signed)
Patient declined to have vitals retaken at this time. He is currently sleeping with his blood pressure hanging off of his arm. Patient stated he was tired and wanted to rest.

## 2022-01-25 NOTE — ED Notes (Signed)
Social Services, [place], [employer] Social Worker [phone] Fax [phone]

## 2022-01-25 NOTE — ED Provider Notes (Signed)
Emergency Medicine Observation Re-evaluation Note  Gary [name] is a 66 y.o. male, seen on rounds today.  Pt initially presented to the ED for complaints of Shortness of Breath  Currently, the patient is calm, no acute complaints.  Physical Exam  Blood pressure 104/74, pulse 82, temperature 98.2 F (36.8 C), temperature source Axillary, resp. rate 20, SpO2 99 %. Physical Exam General: NAD Lungs: CTAB Psych: not agitated  ED Course / MDM  EKG:    I have reviewed the labs performed to date as well as medications administered while in observation.  Recent changes in the last 24 hours include no acute events overnight.    Plan  Current plan is for [facility] placement   Carrie Mew, MD 01/25/22 [phone]

## 2022-01-25 NOTE — TOC Progression Note (Addendum)
Transition of Care [facility]) - Progression Note    Patient Details  Name: Gary [name] MRN: [id number] Date of Birth: 1955-09-18  Transition of Care [facility]) CM/SW [place], [place] Phone Number: 01/25/2022, 2:13 PM  Clinical Narrative:     CSW notified dtr would like an update. CSW followed up with APS worker, Blanch Media. Blanch Media states she will visit pt this morning and let CSW know the plan to move forward. Dtr asking for an update again. CSW attempted to follow up with APS worker, VM is full. TOC will continue to follow for DC needs.   4:20 CSW has not received a call back from [place] social worker with an update. Attempted to touch base with dtr, went straight to VM. TOC will continue to follow for DC needs.    Expected Discharge Plan and Services                                                 Social Determinants of Health (SDOH) Interventions    Readmission Risk Interventions    12/31/2021    8:58 AM 12/20/2021    4:21 PM 05/04/2021    1:26 PM  Readmission Risk Prevention Plan  Transportation Screening Complete Complete Complete  Medication Review Press photographer) Complete Complete Complete  PCP or Specialist appointment within 3-5 days of discharge Complete Complete Complete  HRI or Home Care Consult Complete Complete Complete  SW Recovery Care/Counseling Consult Complete Complete Complete  Palliative Care Screening Complete Complete Not [place] Not Applicable Not Applicable Not Applicable

## 2022-01-26 NOTE — ED Notes (Signed)
Pt assisted in repositioning.

## 2022-01-26 NOTE — Progress Notes (Signed)
PT Cancellation Note  Patient Details Name: Gary [name] MRN: [id number] DOB: 10-10-1955   Cancelled Treatment:    Reason Eval/Treat Not Completed: Other (comment) PT orders received, chart reviewed. Pt well known to PT services & with minimal participation in the past. Per notes, pt d/c home with hoyer lift for transfers. Pt doesn't have acute PT needs at this time. Team notified, orders completed.  Lavone Nian, PT, DPT 01/26/22, 12:58 PM   [name] 01/26/2022, 12:57 PM

## 2022-01-26 NOTE — ED Provider Notes (Signed)
Patient initially was refusing SNF placement but reportedly his significant other is no longer returning home so he will need long-term care.  Social work notified.  He is otherwise at his baseline state of health.   Duffy Bruce, MD 01/26/22 2352
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
## 2022-01-26 NOTE — ED Notes (Signed)
Pt purewick leaking. Pt purewick changed. Pt asking to have BM, pt had BM on bedpan. Pt cleaned. Bedding changed. Pt given [place] sandwich. Pt denying any needs at this time.

## 2022-01-26 NOTE — ED Notes (Signed)
Pt cleanup by this Probation officer, Myra, NT and Deneise Lever, RN with no issue. Pt appreciative. Pt had moderate sized bowel movement.

## 2022-01-26 NOTE — ED Notes (Signed)
Pt requested 1 sprite, 2 peanutbutter packets and 3 packages of graham crackers. Sat pt up to eat requested food. Rn approved of food.

## 2022-01-27 LAB — CBC
HCT: 36.7 % — ABNORMAL LOW (ref 39.0–52.0)
Hemoglobin: 9.7 g/dL — ABNORMAL LOW (ref 13.0–17.0)
MCH: 21.1 pg — ABNORMAL LOW (ref 26.0–34.0)
MCHC: 26.4 g/dL — ABNORMAL LOW (ref 30.0–36.0)
MCV: 79.8 fL — ABNORMAL LOW (ref 80.0–100.0)
Platelets: 168 10*3/uL (ref 150–400)
RBC: 4.6 MIL/uL (ref 4.22–5.81)
RDW: 17.8 % — ABNORMAL HIGH (ref 11.5–15.5)
WBC: 6 10*3/uL (ref 4.0–10.5)
nRBC: 0 % (ref 0.0–0.2)

## 2022-01-27 NOTE — Progress Notes (Signed)
Patient is not ready to go on home trilogy. He states he does not need any help when he decides to go on it.

## 2022-01-27 NOTE — ED Notes (Addendum)
Pt in bed. A&Ox4. Respiratory at bedside helping suction trach. Urine draining into suction with 1200cc. Pt resting in bed waiting for breakfast.

## 2022-01-27 NOTE — ED Notes (Signed)
Pt took 8am medications and tolerated well. Pt has breakfast at bedside.

## 2022-01-27 NOTE — ED Notes (Signed)
Pt received and tolerated all morning medications. Pt repositioned in bed. Pt relaxing in bed. Vitals stable.

## 2022-01-27 NOTE — TOC Progression Note (Signed)
Transition of Care [facility]) - Progression Note    Patient Details  Name: Gary [name] MRN: [id number] Date of Birth: Aug 14, 1955  Transition of Care [facility]) CM/SW [place], [place] Phone Number: 01/27/2022, 3:17 PM  Clinical Narrative:     CSW spoke with pt, provided update. Pt states he is going home on Monday. CSW advised that per pt's sister pt's girlfriend will not be providing care going forward, pt adamant that care would be provided by his girlfriend. Pt states he just spoke with his girlfriend, CSW asked when, he states a couple of days ago, CSW advised this conversation between the girlfriend and sister was yesterday, pt states sister is lying. CSW asked pt for girlfriend's phone number, he states he doesn't know it by heart because his phone is dead. Pt denying he told staff on arrival that care had not been provided for three days. CSW advised that since there is an open APS case we would defer to them for disposition assistance. Pt states he cannot go to a facility because he would be homeless when he comes out. Pt states he will be leaving regardless of what APS says. Pt states all he needs to do is have his trach removed and he will be fine to be alone. CSW asked pt if he could walk, pt states he and his girlfriend were working on getting him to walk before.   At this time CSW will make a recommendation of an IVC for pt if he tries to leave on Monday as per sister there is no one at home to care for him and CSW unable to speak to pt's girlfriend to confirm she will be able to care for him.         Expected Discharge Plan and Services                                                 Social Determinants of Health (SDOH) Interventions    Readmission Risk Interventions    12/31/2021    8:58 AM 12/20/2021    4:21 PM 05/04/2021    1:26 PM  Readmission Risk Prevention Plan  Transportation Screening Complete Complete Complete  Medication Review Human resources officer) Complete Complete Complete  PCP or Specialist appointment within 3-5 days of discharge Complete Complete Complete  HRI or Home Care Consult Complete Complete Complete  SW Recovery Care/Counseling Consult Complete Complete Complete  Palliative Care Screening Complete Complete Not [place] Not Applicable Not Applicable Not Applicable

## 2022-01-27 NOTE — Progress Notes (Signed)
PT placed on home Trilogy unit with 4L O2 bleed, SpO2 96%.

## 2022-01-27 NOTE — ED Notes (Signed)
Pt cleaned up, new purewick placed. Pt given sandwich and resting comfortably. Call light within reach

## 2022-01-28 NOTE — Evaluation (Signed)
Physical Therapy Evaluation Patient Details Name: Gary [name] MRN: [id number] DOB: [birth year] Today's Date: 01/28/2022  History of Present Illness  66 y.o. 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  Prolonged admittances earlier this year for similar.  Clinical Impression  Pt known to this PT from prior admits, similar phyiscal presentation with improved motivation and attitude.  He did well with some light supine exercises and was eager to sit up at EOB.  He needed significant assist but generally did well and maintained appropriate vitals for ~5 minutes of EOB sitting (did have BM, needing to return to supine, nursing notified for clean up).  Pt unsure about ability to return home, reports he has not been standing, Hoyerlifting, etc and had only had minimal HHPT interaction since most recent hospitalization.  At this point PT will recommend STR as he seems motivated to work on his mobility, unsure of the logistics, family dynamics, etc about the possibility of returning home at d/c.       Recommendations for follow up therapy are one component of a multi-disciplinary discharge planning process, led by the attending physician.  Recommendations may be updated based on patient status, additional functional criteria and insurance authorization.  Follow Up Recommendations Skilled nursing-short term rehab (<3 hours/day) (per progress & home situation, HHPT vs STR vs LTAC) Can patient physically be transported by private vehicle: No    Assistance Recommended at Discharge Frequent or constant Supervision/Assistance  Patient can return home with the following  Two people to help with walking and/or transfers;Two people to help with bathing/dressing/bathroom;Assistance with cooking/housework;Assist for transportation;Help with stairs or ramp for entrance    Equipment Recommendations  None recommended by PT  Recommendations for Other Services       Functional Status Assessment Patient has had a recent decline in their functional status and/or demonstrates limited ability to make significant improvements in function in a reasonable and predictable amount of time     Precautions / Restrictions Precautions Precautions: Fall Precaution Comments: cuffed trach/ trach collar Restrictions Weight Bearing Restrictions: No      Mobility  Bed Mobility Overal bed mobility: Needs Assistance Bed Mobility: Rolling Rolling: Min assist, Mod assist   Supine to sit: Mod assist Sit to supine: Mod assist, Max assist   General bed mobility comments: Pt was eager to try and sit up, showed good effort but ultimately needed assist with LEs, HHA to pull up with a slow, progressive working toward upright.  Pt's O2 remained in the 90s, HR relatively stable.  Able to maintain sitting for multiple minutes w/o excessive fatigue (that was an issue at last admit) - found to have a BM once up in sitting, returned to supine for clean up    Transfers                        Ambulation/Gait                  Stairs            Wheelchair Mobility    Modified Rankin (Stroke Patients Only)       Balance   Sitting-balance support: Feet supported, Bilateral upper extremity supported Sitting balance-[name] Scale: Good  Pertinent Vitals/Pain Pain Assessment Pain Assessment: Faces Faces Pain Scale: Hurts a little bit Pain Location: does not have knee pain like on prior admission, general soreness being in bed with no focal pain    Home Living Family/patient expects to be discharged to:: Unsure (thinking he may be needing LTC) Living Arrangements:  (girl-friend) Available Help at Discharge: Family;Available 24 hours/day   Home Access: Level entry;Ramped entrance       Home Layout: One level Home Equipment:  Cane - single point;Wheelchair - IT trainer (2 wheels);Other (comment);Hospital bed [facility] Lift)      Prior Function Prior Level of Function : Needs assist             Mobility Comments: Pt states he has been doing some bed exercises at home, sits up occasionally (limited Hoyering) but no walking in recent months.  Reports only limited PT services at home       Hand Dominance        Extremity/Trunk Assessment   Upper Extremity Assessment Upper Extremity Assessment: Generalized weakness;Overall WFL for tasks assessed    Lower Extremity Assessment Lower Extremity Assessment: Generalized weakness (R LE grossly 3/5, L 4-/5)       Communication   Communication: Tracheostomy;Passy-Muir valve  Cognition Arousal/Alertness: Awake/alert Behavior During Therapy: WFL for tasks assessed/performed, Flat affect Overall Cognitive Status: Within Functional Limits for tasks assessed                                 General Comments: Pt more attentive, calm and motivated than had typically been the case on prior admits with this PT        General Comments General comments (skin integrity, edema, etc.): Pt motivated to work with PT, showed appropriate demeanor/attitude/effort    Exercises General Exercises - Lower Extremity Ankle Circles/Pumps: AROM, 10 reps, Both Heel Slides: AROM, AAROM, 10 reps (with resisted leg ext) Hip ABduction/ADduction: AROM, AAROM, 10 reps   Assessment/Plan    PT Assessment Patient needs continued PT services  PT Problem List Decreased strength;Decreased mobility;Decreased activity tolerance;Cardiopulmonary status limiting activity;Decreased balance;Decreased skin integrity;Obesity       PT Treatment Interventions Functional mobility training;Neuromuscular re-education;Balance training;Therapeutic exercise;Therapeutic activities    PT Goals (Current goals can be found in the Care Plan section)  Acute Rehab PT  Goals Patient Stated Goal: stand up PT Goal Formulation: With patient Time For Goal Achievement: 02/10/22 Potential to Achieve Goals: Fair    Frequency Min 2X/week     Co-evaluation               AM-PAC PT "6 Clicks" Mobility  Outcome Measure Help needed turning from your back to your side while in a flat bed without using bedrails?: A Little Help needed moving from lying on your back to sitting on the side of a flat bed without using bedrails?: A Lot Help needed moving to and from a bed to a chair (including a wheelchair)?: Total Help needed standing up from a chair using your arms (e.g., wheelchair or bedside chair)?: Total Help needed to walk in hospital room?: Total Help needed climbing 3-5 steps with a railing? : Total 6 Click Score: 9    End of Session Equipment Utilized During Treatment: Oxygen Activity Tolerance: Patient tolerated treatment well Patient left: with call bell/phone within reach;in bed;with bed alarm set;with nursing/sitter in room Nurse Communication: Mobility status (need for BM clean up) PT Visit Diagnosis: Unsteadiness on  feet (R26.81);Muscle weakness (generalized) (M62.81);Difficulty in walking, not elsewhere classified (R26.2)    Time: 5883-2549 PT Time Calculation (min) (ACUTE ONLY): 29 min   Charges:   PT Evaluation $PT Eval Moderate Complexity: 1 Mod PT Treatments $Therapeutic Exercise: 8-22 mins        Kreg Shropshire, DPT 01/28/2022, 10:07 AM

## 2022-01-28 NOTE — ED Notes (Signed)
Pt had a large BM. Pt was cleaned and new chucks placed on bed.

## 2022-01-28 NOTE — ED Notes (Signed)
RT at bedside for deep suctioning. Pt breakfast tray at bedside for when cares are complete

## 2022-01-28 NOTE — ED Notes (Signed)
Pt had BM. Pt cleaned up and new chux placed below patient

## 2022-01-28 NOTE — TOC Progression Note (Signed)
Transition of Care [facility]) - Progression Note    Patient Details  Name: Gary [name] MRN: [id number] Date of Birth: 1955/07/30  Transition of Care [facility]) CM/SW [place], [place] Phone Number: 01/28/2022, 12:53 PM  Clinical Narrative:     CSW spoke with pt via phone, pt states he is now agreeable to SNF placement. He states he spoke with his sister who agrees to pay his rent while he is at [facility]. Pt states he will be able to walk when he leaves SNF and will be better able to care for himself at that point. CSW will work pt up and fax out in the [place] area per pt request.        Expected Discharge Plan and Services                                                 Social Determinants of Health (SDOH) Interventions    Readmission Risk Interventions    12/31/2021    8:58 AM 12/20/2021    4:21 PM 05/04/2021    1:26 PM  Readmission Risk Prevention Plan  Transportation Screening Complete Complete Complete  Medication Review Press photographer) Complete Complete Complete  PCP or Specialist appointment within 3-5 days of discharge Complete Complete Complete  HRI or Home Care Consult Complete Complete Complete  SW Recovery Care/Counseling Consult Complete Complete Complete  Palliative Care Screening Complete Complete Not [place] Not Applicable Not Applicable Not Applicable

## 2022-01-28 NOTE — ED Notes (Signed)
Pt resting, watching TV. Provided pt with ginger ale and crackers. Pt offers no further complaints at this time

## 2022-01-28 NOTE — ED Provider Notes (Signed)
-----------------------------------------   5:42 AM on 01/28/2022 -----------------------------------------   Blood pressure 127/81, pulse 79, temperature 98.6 F (37 C), temperature source Oral, resp. rate 18, SpO2 96 %.  The patient is calm and cooperative at this time.  There have been no acute events since the last update.  Awaiting disposition plan from Social Work team.   Paulette Blanch, MD 01/28/22 [phone]

## 2022-01-28 NOTE — ED Notes (Signed)
Received verbal report from [facility].

## 2022-01-28 NOTE — ED Notes (Signed)
Pt states he does not want to do nebulizer treatment at this time due to not feeling awake enough. Pt encouraged to take medication at this time, pt states he will not do it at this time.

## 2022-01-28 NOTE — ED Notes (Signed)
Visitors to patient's bedside. Urine canister emptied at this time

## 2022-01-28 NOTE — ED Notes (Signed)
Patient assisted onto bedpan for second BM today. BM soft and brown. Pt taken off bedpan and cleaned by staff. Chux below pt remain clean

## 2022-01-29 NOTE — TOC Progression Note (Signed)
Transition of Care [facility]) - Progression Note    Patient Details  Name: Gary [name] MRN: [id number] Date of Birth: 1956/02/20  Transition of Care [facility]) CM/SW [place], [place] Phone Number: 01/29/2022, 10:15 AM  Clinical Narrative:     CSW notes per weekend [facility] patient now agreeable to SNF as family will pay for his apartment where he will return following short term rehab.   CSW notes referrals were faxed out with incomplete clinicals,no fl2 or PASRR. CSW has completed fl2 with PASRR number and expanded bed search in hub, pending bed offers at this time.     Expected Discharge Plan: [place] Barriers to Discharge: Continued Medical Work up, Ship broker, Unsafe home situation  Expected Discharge Plan and Services Expected Discharge Plan: [place] In-house Referral: Clinical Social Work     Living arrangements for the past 2 months: Single Family Home                                       Social Determinants of Health (SDOH) Interventions    Readmission Risk Interventions    12/31/2021    8:58 AM 12/20/2021    4:21 PM 05/04/2021    1:26 PM  Readmission Risk Prevention Plan  Transportation Screening Complete Complete Complete  Medication Review Press photographer) Complete Complete Complete  PCP or Specialist appointment within 3-5 days of discharge Complete Complete Complete  HRI or Home Care Consult Complete Complete Complete  SW Recovery Care/Counseling Consult Complete Complete Complete  Palliative Care Screening Complete Complete Not [place] Not Applicable Not Applicable Not Applicable

## 2022-01-29 NOTE — NC FL2 (Signed)
[place] LEVEL OF CARE SCREENING TOOL     IDENTIFICATION  Patient Name: Gary [name] Birthdate: 1955/08/16 Sex: male Admission Date (Current Location): 01/23/2022  [address] and [place] Number:  Engineering geologist and Address:  [facility], [address], [place], [place] [address]      Provider Number: [id number]  Attending Physician Name and Address:  No att. providers found  Relative Name and Phone Number:  [name] [phone]    Current Level of Care: Hospital Recommended Level of Care: [place] Prior Approval Number:    Date Approved/Denied:   PASRR Number: [id number]  Discharge Plan: SNF    Current Diagnoses: Patient Active Problem List   Diagnosis Date Noted   Hematochezia    Bloody stool 11/12/2021   Major depressive disorder, recurrent episode, moderate ([place]) 10/22/2021   Pressure injury of skin 09/27/2021   Acute on chronic respiratory failure with hypercapnia ([place]) 08/07/2021   COPD (chronic obstructive pulmonary disease) ([place])    HLD (hyperlipidemia)    Iron deficiency anemia    Chronic kidney disease, stage 3a ([place]) 07/22/2021   Acute cardiac pulmonary edema ([place]) 07/22/2021   Acute on chronic respiratory failure with hypoxia and hypercapnia ([place]) 07/20/2021   Chronic obstructive pulmonary disease ([place])    Positive D dimer    CHF exacerbation ([place]) 05/03/2021   Acute CHF (congestive heart failure) ([place]) 04/04/2021   Weakness    Pneumonia 03/11/2021   Acute respiratory failure with hypoxia and hypercapnia ([place]) 03/11/2021   (HFpEF) heart failure with preserved ejection fraction ([name])    Chronic respiratory failure with hypoxia ([place])    Hyperlipidemia 02/19/2021   Acute respiratory distress syndrome (ARDS) due to COVID-19 virus ([place])    Acute on chronic respiratory failure ([place]) 02/18/2021   Generalized osteoarthritis of multiple sites 10/31/2020   AKI (acute kidney injury) ([place])  03/04/2020   Hyperkalemia 03/04/2020   COPD with acute exacerbation ([place]) 03/02/2020   Marijuana use 01/17/2020   Thrombocytopenia ([place]) 01/17/2020   Positive hepatitis C antibody test 01/17/2020   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 mass index (BMI) of 50.0 to 59.9 in adult [facility]) 01/17/2020   Osteoarthritis of glenohumeral joints (Bilateral) 01/17/2020   Osteoarthritis of AC (acromioclavicular) joints (Bilateral) 01/17/2020   Polysubstance abuse ([place]) 63/04/6008   Toxic metabolic encephalopathy 93/23/5573   Chronic pain syndrome 07/14/2019   DDD (degenerative disc disease), lumbosacral 07/14/2019   DDD (degenerative disc disease), cervical 07/14/2019   CHF (congestive heart failure) ([place]) 04/12/2017   Acute on chronic diastolic CHF (congestive heart failure) ([place]) 02/28/2015   Obstructive sleep apnea 02/28/2015   Acute on chronic diastolic congestive heart failure ([name])    Acute on chronic respiratory failure with hypoxia ([name])    Morbid obesity ([place]) 04/01/2014   HTN (hypertension) 04/01/2014    Orientation RESPIRATION BLADDER Height & Weight     Self, Time, Situation, Place  Tracheostomy, O2 ([place] XLT 43m cuffed, 5L O2) Incontinent, External catheter Weight:   Height:     BEHAVIORAL SYMPTOMS/MOOD NEUROLOGICAL BOWEL NUTRITION STATUS      Incontinent Diet (see dc summary)  AMBULATORY STATUS COMMUNICATION OF NEEDS Skin   Extensive Assist Verbally Surgical wounds (closed incision-neck 10/13/21)                       Personal Care Assistance Level of Assistance  Bathing, Feeding, Dressing, Total care Bathing Assistance: Maximum assistance Feeding assistance: Independent Dressing Assistance:  Limited assistance Total Care Assistance: Maximum assistance   Functional Limitations Info  Sight, Hearing, Speech Sight Info: Adequate Hearing Info: Adequate Speech Info: Adequate    SPECIAL CARE FACTORS FREQUENCY  PT (By licensed PT), OT (By licensed OT)      PT Frequency: 5x week OT Frequency: 5x week            Contractures Contractures Info: Not present    Additional Factors Info  Code Status, Allergies, Psychotropic Code Status Info: full Allergies Info: nka Psychotropic Info: Gabapentin every 8 hours         Current Medications (01/29/2022):  This is the current hospital active medication list Current Facility-Administered Medications  Medication Dose Route Frequency Provider Last Rate Last Admin   apixaban (ELIQUIS) tablet 2.5 mg  2.5 mg Oral BID Duffy Bruce, MD   2.5 mg at 01/28/22 2120   benzonatate (TESSALON) capsule 100 mg  100 mg Oral TID PRN Duffy Bruce, MD       budesonide (PULMICORT) nebulizer solution 0.25 mg  0.25 mg Nebulization BID Duffy Bruce, MD   0.25 mg at 01/28/22 2121   clonazePAM (KLONOPIN) disintegrating tablet 0.5 mg  0.5 mg Oral BID PRN Duffy Bruce, MD   0.5 mg at 01/28/22 2121   escitalopram (LEXAPRO) tablet 5 mg  5 mg Oral Daily Duffy Bruce, MD   5 mg at 01/28/22 1106   ferrous sulfate tablet 325 mg  325 mg Oral BID WC Duffy Bruce, MD   325 mg at 01/28/22 1648   gabapentin (NEURONTIN) capsule 400 mg  400 mg Oral Q8H Duffy Bruce, MD   400 mg at 01/28/22 2121   Ipratropium-Albuterol (COMBIVENT) respimat 2 puff  2 puff Inhalation BID Duffy Bruce, MD   2 puff at 01/28/22 2122   ipratropium-albuterol (DUONEB) 0.5-2.5 (3) MG/3ML nebulizer solution 3 mL  3 mL Nebulization Q6H PRN Duffy Bruce, MD   3 mL at 01/27/22 2253   melatonin tablet 5 mg  5 mg Oral QHS Duffy Bruce, MD   5 mg at 01/28/22 2121   mometasone-formoterol (DULERA) 200-5 MCG/ACT inhaler 2 puff  2 puff Inhalation BID Duffy Bruce, MD   2 puff at 01/28/22 2122   pantoprazole (PROTONIX) EC tablet 40 mg  40 mg Oral BID Shirlee Latch, MD   40 mg at 01/28/22 1648   potassium chloride SA (KLOR-CON M) CR tablet 10 mEq  10 mEq Oral Daily Duffy Bruce, MD   10 mEq at 01/28/22 1106   QUEtiapine (SEROQUEL)  tablet 25 mg  25 mg Oral BID PRN Duffy Bruce, MD   25 mg at 01/28/22 2121   simvastatin (ZOCOR) tablet 10 mg  10 mg Oral q1800 Duffy Bruce, MD   10 mg at 01/28/22 1722   torsemide (DEMADEX) tablet 20 mg  20 mg Oral Daily Duffy Bruce, MD   20 mg at 01/28/22 1106   Current Outpatient Medications  Medication Sig Dispense Refill   apixaban (ELIQUIS) 2.5 MG TABS tablet Take 1 tablet (2.5 mg total) by mouth 2 (two) times daily. 60 tablet 0   benzonatate (TESSALON) 100 MG capsule Take 100 mg by mouth 3 (three) times daily as needed for cough.     clonazePAM (KLONOPIN) 0.5 MG disintegrating tablet Take 1 tablet (0.5 mg total) by mouth 2 (two) times daily as needed (Anxiety / agitation). 30 tablet 0   escitalopram (LEXAPRO) 5 MG tablet Take 1 tablet (5 mg total) by mouth daily. 30 tablet 0   ferrous  sulfate 325 (65 FE) MG tablet Take 1 tablet (325 mg total) by mouth 2 (two) times daily with a meal. 60 tablet 1   gabapentin (NEURONTIN) 400 MG capsule Take 1 capsule (400 mg total) by mouth every 8 (eight) hours. 90 capsule 0   hydrOXYzine (ATARAX) 25 MG tablet Take 1 tablet (25 mg total) by mouth 3 (three) times daily as needed for anxiety. 30 tablet 0   pantoprazole (PROTONIX) 40 MG tablet Take 1 tablet (40 mg total) by mouth 2 (two) times daily before a meal. 60 tablet 0   polyethylene glycol (MIRALAX / GLYCOLAX) 17 g packet Take 17 g by mouth daily as needed for moderate constipation. 14 each 0   ADVAIR HFA 230-21 MCG/ACT inhaler Inhale 2 puffs into the lungs 2 (two) times daily. (Patient not taking: Reported on 01/24/2022)     bisacodyl (DULCOLAX) 10 MG suppository Place 1 suppository (10 mg total) rectally daily as needed for moderate constipation. 12 suppository 0   budesonide (PULMICORT) 0.25 MG/2ML nebulizer solution Take 2 mLs (0.25 mg total) by nebulization 2 (two) times daily. (Patient not taking: Reported on 01/24/2022) 120 mL 0   COMBIVENT RESPIMAT 20-100 MCG/ACT AERS respimat Inhale  2 puffs into the lungs in the morning and at bedtime. (Patient not taking: Reported on 01/24/2022)     ipratropium-albuterol (DUONEB) 0.5-2.5 (3) MG/3ML SOLN Take 3 mLs by nebulization every 6 (six) hours. (Patient taking differently: Take 3 mLs by nebulization every 6 (six) hours as needed.) 360 mL    melatonin 5 MG TABS Take 1 tablet (5 mg total) by mouth at bedtime. 30 tablet 0   potassium chloride (KLOR-CON M) 10 MEQ tablet Take 1 tablet (10 mEq total) by mouth daily. (Patient not taking: Reported on 01/24/2022) 30 tablet 5   QUEtiapine (SEROQUEL) 25 MG tablet Take 1 tablet (25 mg total) by mouth 2 (two) times daily as needed (agitation). (Patient not taking: Reported on 01/24/2022) 60 tablet 0   simvastatin (ZOCOR) 10 MG tablet Take 1 tablet (10 mg total) by mouth daily at 6 PM. (Patient not taking: Reported on 01/24/2022) 30 tablet 1   torsemide (DEMADEX) 20 MG tablet Take 1 tablet (20 mg total) by mouth daily. 30 tablet 0     Discharge Medications: Please see discharge summary for a list of discharge medications.  Relevant Imaging Results:  Relevant Lab Results:   Additional Information SSN [id number]  Alberteen Sam, LCSW

## 2022-01-29 NOTE — ED Notes (Signed)
Pt assisted with removal of bipap and replacement of oxygenation via trach collar.

## 2022-01-29 NOTE — ED Provider Notes (Signed)
-----------------------------------------   5:39 AM on 01/29/2022 -----------------------------------------   Blood pressure 111/75, pulse 75, temperature 99 F (37.2 C), temperature source Oral, resp. rate 20, SpO2 93 %.  The patient is calm and cooperative at this time.  There have been no acute events since the last update.  Awaiting disposition plan from case management/social work.    Prakriti Carignan, Delice Bison, DO 01/29/22 [phone]

## 2022-01-30 NOTE — ED Provider Notes (Signed)
-----------------------------------------   6:04 AM on 01/30/2022 -----------------------------------------   Blood pressure 112/82, pulse 71, temperature 99.1 F (37.3 C), temperature source Oral, resp. rate 20, height '6\' 3"'$  (1.905 m), weight (!) 174.6 kg, SpO2 97 %.  The patient is calm and cooperative at this time.  There have been no acute events since the last update.  Awaiting disposition plan from Social Work team.   Paulette Blanch, MD 01/30/22 [phone]

## 2022-01-30 NOTE — ED Notes (Signed)
Pt used call bell to notify male purewick must be malpositioned as states is wet. Notified pt will cleanse and change out purewick once another staff member is available to assist this RN as pt cannot assist much during turning.

## 2022-01-30 NOTE — ED Notes (Signed)
This RN answered call light. Patient requesting [place] sandwich and sprite. Provided per request. Patient AOX4 resp even, regular on trach collar at 28% FiO2. Sitting upright watching television with no distress noted.

## 2022-01-30 NOTE — ED Notes (Signed)
Repositioned patient in bed per request. During reposition patient smacked RN's hand while RN was attempting to help and patient stated, "Don't touch my knee!" Patient instructed not to smack at staff. No distress noted. Positioned for comfort.

## 2022-01-30 NOTE — Progress Notes (Signed)
Physical Therapy Treatment Patient Details Name: Gary [name] MRN: [id number] DOB: [birth year] Today's Date: 01/30/2022   History of Present Illness 66 y.o. 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  Prolonged admittances earlier this year for similar.    PT Comments    Pt resting in bed upon PT arrival; agreeable to PT session but requesting to perform LE ex's prior to attempting mobility.  Pt tolerated B LE ex's fairly well with assist as needed (increased assist required for R LE though d/t R knee discomfort).  During session pt min to mod assist x2 for bed mobility; demonstrated good sitting balance with single UE support and B LE support; and pt did not initiate much to assist with attempt to stand up to bariatric RW with bed height significantly elevated (pt wanted to stand but reported he felt his LE's were too weak to attempt standing).  Will continue to focus on strengthening and progressive functional mobility per pt tolerance.    Recommendations for follow up therapy are one component of a multi-disciplinary discharge planning process, led by the attending physician.  Recommendations may be updated based on patient status, additional functional criteria and insurance authorization.  Follow Up Recommendations  Skilled nursing-short term rehab (<3 hours/day) Can patient physically be transported by private vehicle: No   Assistance Recommended at Discharge Frequent or constant Supervision/Assistance  Patient can return home with the following Two people to help with walking and/or transfers;Two people to help with bathing/dressing/bathroom;Assistance with cooking/housework;Assist for transportation;Help with stairs or ramp for entrance   Equipment Recommendations  Other (comment) (TBD at next facility)    Recommendations for Other Services       Precautions /  Restrictions Precautions Precautions: Fall Precaution Comments: cuffed trach/ trach collar Restrictions Weight Bearing Restrictions: No     Mobility  Bed Mobility Overal bed mobility: Needs Assistance Bed Mobility: Supine to Sit, Sit to Supine     Supine to sit: Min assist, Mod assist, +2 for physical assistance, HOB elevated Sit to supine: Mod assist, +2 for physical assistance, HOB elevated   General bed mobility comments: assist for trunk and B LE's; pt able to help pull self up in bed using UE's on bed rail    Transfers Overall transfer level: Needs assistance Equipment used: Rolling walker (2 wheels) (bariatric) Transfers: Sit to/from Stand Sit to Stand: From elevated surface with 2 assist           General transfer comment: pt did not initiate much to assist to stand (pt stated he did not feel strong enough) so deferred further attempts; bed height significantly elevated    Ambulation/Gait               General Gait Details: not appropriate at this time   Stairs             Wheelchair Mobility    Modified Rankin (Stroke Patients Only)       Balance Overall balance assessment: Needs assistance Sitting-balance support: Single extremity supported, Feet supported Sitting balance-[name] Scale: Fair Sitting balance - Comments: steady static sitting                                    Cognition Arousal/Alertness: Awake/alert Behavior During Therapy: WFL for tasks assessed/performed, Flat affect Overall Cognitive Status: Within  Functional Limits for tasks assessed                                          Exercises Total Joint Exercises Ankle Circles/Pumps: AROM, Strengthening, Both, 10 reps, Supine Quad Sets: AROM, Strengthening, Both, 10 reps, Supine Short Arc Quad: AAROM, Strengthening, Both, 10 reps, Supine Heel Slides: AAROM, Strengthening, Supine (2x10 reps R LE; x10 reps L LE) Hip ABduction/ADduction:  AAROM, Strengthening, Both, 10 reps, Supine    General Comments  Nursing cleared pt for participation in physical therapy.  Pt agreeable to PT session.      Pertinent Vitals/Pain Pain Assessment Pain Assessment: Faces Faces Pain Scale: Hurts a little bit Pain Location: R knee Pain Descriptors / Indicators: Sore Pain Intervention(s): Limited activity within patient's tolerance, Monitored during session, Repositioned O2 sats 92% or greater during sessions activities.    Home Living                          Prior Function            PT Goals (current goals can now be found in the care plan section) Acute Rehab PT Goals Patient Stated Goal: stand up PT Goal Formulation: With patient Time For Goal Achievement: 02/10/22 Potential to Achieve Goals: Fair Progress towards PT goals: Progressing toward goals    Frequency    Min 2X/week      PT Plan Current plan remains appropriate    Co-evaluation              AM-PAC PT "6 Clicks" Mobility   Outcome Measure  Help needed turning from your back to your side while in a flat bed without using bedrails?: A Lot Help needed moving from lying on your back to sitting on the side of a flat bed without using bedrails?: Total Help needed moving to and from a bed to a chair (including a wheelchair)?: Total Help needed standing up from a chair using your arms (e.g., wheelchair or bedside chair)?: Total Help needed to walk in hospital room?: Total Help needed climbing 3-5 steps with a railing? : Total 6 Click Score: 7    End of Session Equipment Utilized During Treatment: Oxygen (trach collar) Activity Tolerance: Patient tolerated treatment well Patient left: in bed;with call bell/phone within reach;with bed alarm set Nurse Communication: Mobility status;Precautions;Other (comment) (pt requesting drink) PT Visit Diagnosis: Unsteadiness on feet (R26.81);Muscle weakness (generalized) (M62.81);Difficulty in walking, not  elsewhere classified (R26.2)     Time: 4128-7867 PT Time Calculation (min) (ACUTE ONLY): 38 min  Charges:  $Therapeutic Exercise: 8-22 mins $Therapeutic Activity: 23-37 mins                     Leitha Bleak, PT 01/30/22, 4:28 PM

## 2022-01-30 NOTE — ED Notes (Addendum)
Pt provided peri care, bed linens and pads changed, purewick and line changed out, to suction canister at 80, pt repositioned in bed, HOB adjusted as pt requested, belongings within reach on bedside table, call bell within reach, bed locked in place; attempted to lower bed all the way down for safety but pt refused. Door all the way open at pt's request stating "I have claustrophobia". Pt given 3 fresh warm blankets. This RN and NT completed together with pt's assistance; pt at least a 2 assist.

## 2022-01-30 NOTE — ED Notes (Signed)
Trach suctioning performed per request. Patient tolerated with no distress.

## 2022-01-30 NOTE — ED Notes (Signed)
Patient declined gabapentin at this time. Stated he would take it later. He wanted to sleep.

## 2022-01-30 NOTE — ED Notes (Signed)
Patient placed on Cpap with 3L oxygen bled in. No distress noted at this time.

## 2022-01-30 NOTE — ED Notes (Signed)
Pt given requested crackers and peanut butter.

## 2022-01-30 NOTE — ED Notes (Signed)
Pt turned, cleaned, changed, and set up for breakfast

## 2022-01-30 NOTE — ED Notes (Signed)
Pt asleep, NAD  

## 2022-01-31 NOTE — ED Notes (Signed)
Patient given graham crackers and two sprites per patient's request. Patient foot also repositioned in bed at this time.

## 2022-01-31 NOTE — ED Notes (Signed)
Patient eating lunch tray at this time °

## 2022-01-31 NOTE — ED Notes (Signed)
Patient resting in bed with eyes closed. Resp even, unlabored on CPAP. No distress noted at this time.

## 2022-01-31 NOTE — ED Notes (Signed)
Patient given saltine crackers and peanut butter as requested.

## 2022-01-31 NOTE — ED Provider Notes (Signed)
Emergency Medicine Observation Re-evaluation Note  Physical Exam   BP 102/60   Pulse 81   Temp 98.2 F (36.8 C) (Oral)   Resp 18   Ht '6\' 3"'$  (1.905 m)   Wt (!) 174.6 kg   SpO2 98%   BMI 48.12 kg/m   Pt is calm and resting, respirations unlabored, and appears comfortable.   ED Course / MDM   No reported events during my shift at the time of this note.   Pt is awaiting dispo from SW   Lucillie Garfinkel MD    Lucillie Garfinkel, MD 01/31/22 2105

## 2022-01-31 NOTE — ED Notes (Signed)
Patient eating breakfast tray

## 2022-01-31 NOTE — ED Notes (Signed)
Patient had large BM. Patient cleaned and new briefs placed by this RN, Opal Sidles RN, and Terrence Dupont, NT. Patient repositioned in the bed at this time.

## 2022-01-31 NOTE — ED Notes (Addendum)
Patient taken off bed pan and cleaned by this RN. Patient reposition in bed multiple times. Patient not happy with how RN repositioned him and yelled at this RN. This RN made patient aware that this RN is helping him and should not speak to staff this way.

## 2022-02-01 NOTE — ED Notes (Addendum)
Pt denies pain but states "I feel stiff all over". PT staff at bedside.

## 2022-02-01 NOTE — ED Provider Notes (Signed)
Emergency Medicine Observation Re-evaluation Note  Gary [name] is a 66 y.o. male, initially seen in the emergency department for shortness of breath.  Now awaiting placement to an appropriate living facility.  No acute events since last update.  Physical Exam  BP 114/60   Pulse 84   Temp 97.9 F (36.6 C) (Axillary)   Resp 18   Ht '6\' 3"'$  (1.905 m)   Wt (!) 174.6 kg   SpO2 96%   BMI 48.12 kg/m    ED Course / MDM   No recent lab work for review.  Plan  Current plan is for placement to an appropriate facility once available.    Harvest Dark, MD 02/01/22 1755

## 2022-02-01 NOTE — ED Notes (Signed)
Hand off of care and report given to [place], [place]

## 2022-02-01 NOTE — ED Notes (Signed)
Pt noted to have a bowel movement. Pt cleaned and given bed bath. New linen placed. Pt adjusted in bed.  Pt alert and oriented. Warm blankets provided. Another RN helped with this. Pt more pleasant and thanked this Therapist, sports

## 2022-02-01 NOTE — ED Notes (Signed)
Attempted to call dietary as pt requested to make specific order for next meal; placed on hold so will try again later.

## 2022-02-01 NOTE — Progress Notes (Signed)
Physical Therapy Treatment Patient Details Name: Gary [name] MRN: [id number] DOB: [birth year] Today's Date: 02/01/2022   History of Present Illness 66 y.o. 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  Prolonged admittances earlier this year for similar.    PT Comments    Pt was long sitting in bed with RN at bedside. He is A and O x 4 and ordered lunch, dinner, and breakfast for tomorrow. He agrees to session but continues to require encouragement and constant motivation throughout. On 5 L trach collar 28%.Tolerated performing there ex in bed prior to sitting up EOB. Required a lot of persuading to attempt standing but eventually agreeable. He stood 2 x EOB (bed height elevated) with +2 assistance for safety. Tolerated standing ~ 5 sec. with prolonged rest between. Encouraged increased there ex throughout the day to promote return in strength. Overall tolerated session well but is somewhat self limiting. Author will continue to follow and progress as able per current POC.    Recommendations for follow up therapy are one component of a multi-disciplinary discharge planning process, led by the attending physician.  Recommendations may be updated based on patient status, additional functional criteria and insurance authorization.  Follow Up Recommendations  Skilled nursing-short term rehab (<3 hours/day) Can patient physically be transported by private vehicle: No   Assistance Recommended at Discharge Frequent or constant Supervision/Assistance  Patient can return home with the following Two people to help with walking and/or transfers;Two people to help with bathing/dressing/bathroom;Assistance with cooking/housework;Assist for transportation;Help with stairs or ramp for entrance   Equipment Recommendations  None recommended by PT       Precautions / Restrictions  Precautions Precautions: Fall Precaution Comments: cuffed trach/ trach collar (5L 28%) Restrictions Weight Bearing Restrictions: No     Mobility  Bed Mobility Overal bed mobility: Needs Assistance Bed Mobility: Supine to Sit, Sit to Supine Rolling: Min assist, Mod assist Supine to sit: Mod assist, +2 for safety/equipment, Max assist, HOB elevated Sit to supine: Max assist, +2 for safety/equipment, HOB elevated   General bed mobility comments: pt was able to achieve EOB short sit with mod assist of one + 2nd person for safety. INcreased time to perform due to R knee pain and pt's willingness. +2 assist throughotu session. Max assist to return to supine from EOB sitting    Transfers Overall transfer level: Needs assistance Equipment used: Rolling walker (2 wheels) (Bariatric) Transfers: Sit to/from Stand Sit to Stand: From elevated surface, +2 physical assistance, +2 safety/equipment, Mod assist         General transfer comment: Pt stood 2 x ~ 5 sec each time. Lots of encouragement required. He stood from elevated bed height with max vcs for technique. lowered bed once standing to ensure pt has hips far enough in bed to prevent sliding off EOB.    Ambulation/Gait  General Gait Details: Unable    Balance Overall balance assessment: Needs assistance Sitting-balance support: Feet supported Sitting balance-[name] Scale: Fair Sitting balance - Comments: Able to maintain balance while seated EOB without assistance. does have weakness in core strength.       Cognition Arousal/Alertness: Awake/alert Behavior During Therapy: WFL for tasks assessed/performed, Flat affect Overall Cognitive Status: Within Functional Limits for tasks assessed      General Comments: Pt is A and O. Does understand current situation however remains resistive to OOB activity. Eventually agreeable after  performing several ther ex in bed first.        Exercises Total Joint Exercises Ankle Circles/Pumps:  AROM, 20 reps Quad Sets: AROM, 10 reps Heel Slides: AAROM, Right, 10 reps, AROM, Left Hip ABduction/ADduction: AROM, 10 reps        Pertinent Vitals/Pain Pain Assessment Pain Assessment: 0-10 Pain Score: 6  Pain Location: R knee Pain Descriptors / Indicators: Sore Pain Intervention(s): Limited activity within patient's tolerance, Monitored during session, Repositioned     PT Goals (current goals can now be found in the care plan section) Acute Rehab PT Goals Patient Stated Goal: go to rehab then go home Progress towards PT goals: Progressing toward goals    Frequency    Min 2X/week      PT Plan Current plan remains appropriate    Co-evaluation     PT goals addressed during session: Mobility/safety with mobility;Strengthening/ROM        AM-PAC PT "6 Clicks" Mobility   Outcome Measure  Help needed turning from your back to your side while in a flat bed without using bedrails?: A Lot Help needed moving from lying on your back to sitting on the side of a flat bed without using bedrails?: A Lot Help needed moving to and from a bed to a chair (including a wheelchair)?: Total Help needed standing up from a chair using your arms (e.g., wheelchair or bedside chair)?: Total Help needed to walk in hospital room?: Total Help needed climbing 3-5 steps with a railing? : Total 6 Click Score: 8    End of Session Equipment Utilized During Treatment: Oxygen Activity Tolerance: Patient tolerated treatment well Patient left: in bed;with call bell/phone within reach;with bed alarm set Nurse Communication: Mobility status;Precautions PT Visit Diagnosis: Unsteadiness on feet (R26.81);Muscle weakness (generalized) (M62.81);Difficulty in walking, not elsewhere classified (R26.2)     Time: 1448-1856 PT Time Calculation (min) (ACUTE ONLY): 32 min  Charges:  $Therapeutic Exercise: 8-22 mins $Therapeutic Activity: 8-22 mins                    Julaine Fusi PTA 02/01/22, 5:35 PM

## 2022-02-01 NOTE — ED Notes (Signed)
Pt used call bell to notify this RN that he needs to urinate. Checked external purewick which remains in good positioning and turned on wall suction to low between 60-80 as suction had not previously been on. Pt started urinating and urine immediately noted in canister. Pt currently denies any other needs.

## 2022-02-01 NOTE — TOC Progression Note (Signed)
Transition of Care [facility]) - Progression Note    Patient Details  Name: BRENNON OTTERNESS MRN: [id number] Date of Birth: [birth year]  Transition of Care [facility]) CM/SW [place], [place] Phone Number: 02/01/2022, 10:37 AM  Clinical Narrative:     Patient has no bed offers at this time however 3 facilities considering. CSW has reached out to 3 facilities considering to identify what barriers there are to them accepting, pending response.   Expected Discharge Plan: [place] Barriers to Discharge: Continued Medical Work up, Ship broker, Unsafe home situation  Expected Discharge Plan and Services Expected Discharge Plan: [place] In-house Referral: Clinical Social Work     Living arrangements for the past 2 months: Single Family Home                                       Social Determinants of Health (SDOH) Interventions    Readmission Risk Interventions    12/31/2021    8:58 AM 12/20/2021    4:21 PM 05/04/2021    1:26 PM  Readmission Risk Prevention Plan  Transportation Screening Complete Complete Complete  Medication Review Press photographer) Complete Complete Complete  PCP or Specialist appointment within 3-5 days of discharge Complete Complete Complete  HRI or Home Care Consult Complete Complete Complete  SW Recovery Care/Counseling Consult Complete Complete Complete  Palliative Care Screening Complete Complete Not [place] Not Applicable Not Applicable Not Applicable

## 2022-02-01 NOTE — ED Notes (Signed)
Pt in NAD, pt asleep, chest rise and fall noted, resp reg/unlabored, skin dry, suction supplies within pt's reach as he cares for trach himself, pt's personal belongings remain within reach, call bell within reach, bed locked low, rails up, door remains open as is pt's preference due to claustrophobia.

## 2022-02-01 NOTE — ED Notes (Signed)
Pt is being rude and offensive. Pt is calling this patient weak and that the girls are stronger than me. This RN was trying to adjust pt in bed. RN informed pt that it is not appropriate to be mean to staff. Pt continued to belittle this RN. This RN informed pt that he would be back in with more staff to adjust patient, but he will not tolerate being belittled. Bed in low position, trach-collar in place on patient.

## 2022-02-02 ENCOUNTER — Emergency Department: Payer: Medicaid Other

## 2022-02-02 MED ORDER — FUROSEMIDE 10 MG/ML IJ SOLN
60.0000 mg | Freq: Once | INTRAMUSCULAR | Status: AC
  Administered 2022-02-02: 60 mg via INTRAVENOUS
  Filled 2022-02-02: qty 8

## 2022-02-02 MED ORDER — DICLOFENAC SODIUM 1 % EX GEL
2.0000 g | Freq: Three times a day (TID) | CUTANEOUS | Status: DC
  Administered 2022-02-02 – 2022-03-26 (×70): 2 g via TOPICAL
  Filled 2022-02-02 (×3): qty 100

## 2022-02-02 NOTE — ED Notes (Signed)
Sitting up in bed eating dinner. No signs of distress. VSS. Dr. Archie Balboa in to evaluate.

## 2022-02-02 NOTE — Progress Notes (Signed)
Physical Therapy Treatment Patient Details Name: Gary [name] MRN: [id number] DOB: [birth year] Today's Date: 02/02/2022   History of Present Illness 66 y.o. 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  Prolonged admittances earlier this year for similar.    PT Comments    Author had issued reb theraband earlier this date with instructions for pt to perform exercises throughout the date. Returned later in the day and pt was actively performing exercises. Pt is well known by author from this admission and previous ones. Today was the most motivated and cooperative pt has been ever. Unfortunately, +2 assistance not available at time of session but  did fully participate in a lot of bed level exercises. He was encouraged to be performing exercises even when not with therapy team. He requested therapy come for 2 PM sessions and reports willingness to attempt transfers in all future sessions. Lengthy education and discussion about pt's personal goals throughout. Discussed lack of rehab bed offers due to trach. " I'll just have to work hard so I can go home." Chief Strategy Officer reminded pt he has stated this before with lack of follow through." This time will be different." Pt is agreeable to work on transfers next PT session.    Recommendations for follow up therapy are one component of a multi-disciplinary discharge planning process, led by the attending physician.  Recommendations may be updated based on patient status, additional functional criteria and insurance authorization.  Follow Up Recommendations  Skilled nursing-short term rehab (<3 hours/day)     Assistance Recommended at Discharge Frequent or constant Supervision/Assistance  Patient can return home with the following Two people to help with walking and/or transfers;Two people to help with bathing/dressing/bathroom;Assistance with  cooking/housework;Assist for transportation;Help with stairs or ramp for entrance   Equipment Recommendations  None recommended by PT (pt has had all equipment needs met in previous admissions.)       Precautions / Restrictions Precautions Precautions: Fall Precaution Comments: cuffed trach/ trach collar (5L 28%) Restrictions Weight Bearing Restrictions: No     Mobility  Bed Mobility  General bed mobility comments: only +1 assistance available. will attempt standing next sessionwith +2 assist. pt did put forth great effort with all exercises this session. MOst effort observed in any past PT sessions. Even request to perform more exercises.      Balance Overall balance assessment: Needs assistance Sitting-balance support: Feet supported Sitting balance-[name] Scale: Fair Sitting balance - Comments: Able to maintain balance while seated EOB without assistance. does have weakness in core strength.       Cognition Arousal/Alertness: Awake/alert Behavior During Therapy: WFL for tasks assessed/performed, Flat affect Overall Cognitive Status: Within Functional Limits for tasks assessed    General Comments: Pt ios A and O x 4. Was made aware of lack rehab options due to trach. He states understanding and says" I'll just have to work hard while i'm here and go home." Chief Strategy Officer knows pt well from previous admissions. today was most motivated he has been throughout all other sessions. Only +1 assist available but is willing to try to stand in future session. lengthy discussion about pt needing to do exercises throughout the day prior to PT coming. He requested 2pm tx times and will attempt standing/ambulation every future session.(per pt)        Exercises Total Joint Exercises Ankle Circles/Pumps: AROM, 20 reps Quad Sets: AROM, 20 reps Gluteal  Sets: 10 reps Short Arc Quad: AROM, 10 reps, Left, AAROM, Right, 20 reps Heel Slides: AAROM, Right, 10 reps, AROM, Left Hip ABduction/ADduction:  AROM, AAROM, 10 reps Straight Leg Raises: AAROM, 20 reps    General Comments General comments (skin integrity, edema, etc.): shorted bed and had pt perform pushing against footrest 20 s. also performed 2 x 20 tricep extension with red therband. continued to encourage perform even when PT not with him. He states and demonstrate abilities to safely perform without assist.      Pertinent Vitals/Pain Pain Assessment Pain Assessment: 0-10 Pain Score: 4  Pain Location: R knee Pain Descriptors / Indicators: Sore Pain Intervention(s): Limited activity within patient's tolerance, Monitored during session, Premedicated before session, Repositioned     PT Goals (current goals can now be found in the care plan section) Acute Rehab PT Goals Patient Stated Goal: " I want to be able to walk again." Progress towards PT goals: Progressing toward goals    Frequency    Min 2X/week      PT Plan Current plan remains appropriate    Co-evaluation     PT goals addressed during session: Mobility/safety with mobility        AM-PAC PT "6 Clicks" Mobility   Outcome Measure  Help needed turning from your back to your side while in a flat bed without using bedrails?: A Lot Help needed moving from lying on your back to sitting on the side of a flat bed without using bedrails?: A Lot Help needed moving to and from a bed to a chair (including a wheelchair)?: Total Help needed standing up from a chair using your arms (e.g., wheelchair or bedside chair)?: Total Help needed to walk in hospital room?: Total Help needed climbing 3-5 steps with a railing? : Total 6 Click Score: 8    End of Session Equipment Utilized During Treatment: Oxygen (5L 28%) Activity Tolerance: Patient tolerated treatment well Patient left: in bed;with call bell/phone within reach;with bed alarm set Nurse Communication: Mobility status;Precautions PT Visit Diagnosis: Unsteadiness on feet (R26.81);Muscle weakness (generalized)  (M62.81);Difficulty in walking, not elsewhere classified (R26.2)     Time: 8309-4076 PT Time Calculation (min) (ACUTE ONLY): 22 min  Charges:  $Therapeutic Exercise: 8-22 mins                    Julaine Fusi PTA 02/02/22, 4:40 PM

## 2022-02-02 NOTE — ED Notes (Addendum)
Complains of pain in right knee. Dr. Archie Balboa informed.

## 2022-02-02 NOTE — Progress Notes (Signed)
Patient on home vent with 2 liters o2 bleed in. Patient is able to put himself on and off home equipment without difficulty. Trach care and suctioning performed earlier without difficulty. Patient tolerated respiratory interventions well.

## 2022-02-02 NOTE — TOC Progression Note (Addendum)
Transition of Care [facility]) - Progression Note    Patient Details  Name: Gary [name] MRN: [id number] Date of Birth: 09/17/1955  Transition of Care [facility]) CM/SW [place], [place] Phone Number: 02/02/2022, 2:00 PM  Clinical Narrative:     CSW reached out to Shaun with [facility] regarding the 3 facilities considering patient, he reports they are unable to accept patient with a cuffed trach, however they could look at patient if it was uncuffed. CSW has reached out to treatment team to identify if this may be an option to get patient on uncuffed trach to assist with facility placement, otherwise this will be a placement barrier.   CSW lvm with girlfriend at [facility], pending call back to provide updates.   CSW spoke with Shaun again who reports they do not accept vent patients, per RT patient has consistently worn nocturnal vent.   CSW has reached out to vent SNFs including:  [facility] - referral sent  [facility] [phone]) referral faxed to [facility] [phone]) referral faxed to [phone]  Pending bed offers.   Expected Discharge Plan: [place] Barriers to Discharge: Continued Medical Work up, Ship broker, Unsafe home situation  Expected Discharge Plan and Services Expected Discharge Plan: [place] In-house Referral: Clinical Social Work     Living arrangements for the past 2 months: Single Family Home                                       Social Determinants of Health (SDOH) Interventions    Readmission Risk Interventions    12/31/2021    8:58 AM 12/20/2021    4:21 PM 05/04/2021    1:26 PM  Readmission Risk Prevention Plan  Transportation Screening Complete Complete Complete  Medication Review Press photographer) Complete Complete Complete  PCP or Specialist appointment within 3-5 days of discharge Complete Complete  Complete  HRI or Home Care Consult Complete Complete Complete  SW Recovery Care/Counseling Consult Complete Complete Complete  Palliative Care Screening Complete Complete Not [place] Not Applicable Not Applicable Not Applicable

## 2022-02-02 NOTE — ED Provider Notes (Signed)
Was asked to evaluate the patient today after nursing staff states he was feeling that his legs were swelling. On my exam patient did have 2+ pitting edema to knees. The patient has history of heart failure and is on oral diuretic. Will check BNP. Will give dose of IV lasix. If there is concern for worsening heart failure on home dose of diuretic would have low barrier for admission.   Nance Pear, MD 02/02/22 [phone]

## 2022-02-02 NOTE — ED Notes (Signed)
Meds given, purewick replaced. Waiting on eliquis from pharmacy

## 2022-02-02 NOTE — ED Notes (Signed)
Significant Other at bedside assisting patient with bathing.

## 2022-02-03 ENCOUNTER — Inpatient Hospital Stay: Payer: Medicaid Other

## 2022-02-03 DIAGNOSIS — Z23 Encounter for immunization: Secondary | ICD-10-CM | POA: Diagnosis not present

## 2022-02-03 DIAGNOSIS — K219 Gastro-esophageal reflux disease without esophagitis: Secondary | ICD-10-CM | POA: Diagnosis present

## 2022-02-03 DIAGNOSIS — I110 Hypertensive heart disease with heart failure: Secondary | ICD-10-CM | POA: Diagnosis present

## 2022-02-03 DIAGNOSIS — F32A Depression, unspecified: Secondary | ICD-10-CM | POA: Insufficient documentation

## 2022-02-03 DIAGNOSIS — Z1152 Encounter for screening for COVID-19: Secondary | ICD-10-CM | POA: Diagnosis not present

## 2022-02-03 DIAGNOSIS — J449 Chronic obstructive pulmonary disease, unspecified: Secondary | ICD-10-CM | POA: Diagnosis present

## 2022-02-03 DIAGNOSIS — J9621 Acute and chronic respiratory failure with hypoxia: Secondary | ICD-10-CM

## 2022-02-03 DIAGNOSIS — J9501 Hemorrhage from tracheostomy stoma: Secondary | ICD-10-CM | POA: Diagnosis present

## 2022-02-03 DIAGNOSIS — I5033 Acute on chronic diastolic (congestive) heart failure: Secondary | ICD-10-CM | POA: Diagnosis present

## 2022-02-03 DIAGNOSIS — E785 Hyperlipidemia, unspecified: Secondary | ICD-10-CM | POA: Diagnosis not present

## 2022-02-03 DIAGNOSIS — N179 Acute kidney failure, unspecified: Secondary | ICD-10-CM | POA: Diagnosis not present

## 2022-02-03 DIAGNOSIS — J9611 Chronic respiratory failure with hypoxia: Secondary | ICD-10-CM | POA: Diagnosis not present

## 2022-02-03 DIAGNOSIS — Z6841 Body Mass Index (BMI) 40.0 and over, adult: Secondary | ICD-10-CM | POA: Diagnosis not present

## 2022-02-03 DIAGNOSIS — G4730 Sleep apnea, unspecified: Secondary | ICD-10-CM | POA: Diagnosis present

## 2022-02-03 DIAGNOSIS — T17890A Other foreign object in other parts of respiratory tract causing asphyxiation, initial encounter: Secondary | ICD-10-CM | POA: Diagnosis present

## 2022-02-03 DIAGNOSIS — M1711 Unilateral primary osteoarthritis, right knee: Secondary | ICD-10-CM | POA: Diagnosis present

## 2022-02-03 DIAGNOSIS — D509 Iron deficiency anemia, unspecified: Secondary | ICD-10-CM | POA: Diagnosis present

## 2022-02-03 DIAGNOSIS — E876 Hypokalemia: Secondary | ICD-10-CM | POA: Diagnosis not present

## 2022-02-03 DIAGNOSIS — Y838 Other surgical procedures as the cause of abnormal reaction of the patient, or of later complication, without mention of misadventure at the time of the procedure: Secondary | ICD-10-CM | POA: Diagnosis present

## 2022-02-03 DIAGNOSIS — M23200 Derangement of unspecified lateral meniscus due to old tear or injury, right knee: Secondary | ICD-10-CM | POA: Diagnosis present

## 2022-02-03 DIAGNOSIS — J9622 Acute and chronic respiratory failure with hypercapnia: Secondary | ICD-10-CM | POA: Diagnosis present

## 2022-02-03 DIAGNOSIS — N400 Enlarged prostate without lower urinary tract symptoms: Secondary | ICD-10-CM | POA: Diagnosis present

## 2022-02-03 DIAGNOSIS — F419 Anxiety disorder, unspecified: Secondary | ICD-10-CM | POA: Diagnosis present

## 2022-02-03 DIAGNOSIS — M23203 Derangement of unspecified medial meniscus due to old tear or injury, right knee: Secondary | ICD-10-CM | POA: Diagnosis present

## 2022-02-03 DIAGNOSIS — L7622 Postprocedural hemorrhage and hematoma of skin and subcutaneous tissue following other procedure: Secondary | ICD-10-CM | POA: Diagnosis present

## 2022-02-03 DIAGNOSIS — J9509 Other tracheostomy complication: Secondary | ICD-10-CM | POA: Diagnosis present

## 2022-02-03 DIAGNOSIS — Z7901 Long term (current) use of anticoagulants: Secondary | ICD-10-CM | POA: Diagnosis not present

## 2022-02-03 DIAGNOSIS — K5900 Constipation, unspecified: Secondary | ICD-10-CM | POA: Diagnosis not present

## 2022-02-03 DIAGNOSIS — Z8616 Personal history of COVID-19: Secondary | ICD-10-CM | POA: Diagnosis not present

## 2022-02-03 LAB — BASIC METABOLIC PANEL
Anion gap: 6 (ref 5–15)
Anion gap: 8 (ref 5–15)
BUN: 38 mg/dL — ABNORMAL HIGH (ref 8–23)
BUN: 39 mg/dL — ABNORMAL HIGH (ref 8–23)
CO2: 34 mmol/L — ABNORMAL HIGH (ref 22–32)
CO2: 34 mmol/L — ABNORMAL HIGH (ref 22–32)
Calcium: 8.7 mg/dL — ABNORMAL LOW (ref 8.9–10.3)
Calcium: 9.1 mg/dL (ref 8.9–10.3)
Chloride: 96 mmol/L — ABNORMAL LOW (ref 98–111)
Chloride: 97 mmol/L — ABNORMAL LOW (ref 98–111)
Creatinine, Ser: 1.39 mg/dL — ABNORMAL HIGH (ref 0.61–1.24)
Creatinine, Ser: 1.42 mg/dL — ABNORMAL HIGH (ref 0.61–1.24)
GFR, Estimated: 57 mL/min — ABNORMAL LOW (ref >60)
GFR, Estimated: 55 mL/min — ABNORMAL LOW (ref >60)
Glucose, Bld: 123 mg/dL — ABNORMAL HIGH (ref 70–99)
Glucose, Bld: 110 mg/dL — ABNORMAL HIGH (ref 70–99)
Potassium: 4.2 mmol/L (ref 3.5–5.1)
Potassium: 4 mmol/L (ref 3.5–5.1)
Sodium: 136 mmol/L (ref 135–145)
Sodium: 139 mmol/L (ref 135–145)

## 2022-02-03 LAB — CBC
HCT: 38.8 % — ABNORMAL LOW (ref 39.0–52.0)
Hemoglobin: 10.5 g/dL — ABNORMAL LOW (ref 13.0–17.0)
MCH: 21 pg — ABNORMAL LOW (ref 26.0–34.0)
MCHC: 27.1 g/dL — ABNORMAL LOW (ref 30.0–36.0)
MCV: 77.6 fL — ABNORMAL LOW (ref 80.0–100.0)
Platelets: 196 10*3/uL (ref 150–400)
RBC: 5 MIL/uL (ref 4.22–5.81)
RDW: 17.9 % — ABNORMAL HIGH (ref 11.5–15.5)
WBC: 7.1 10*3/uL (ref 4.0–10.5)
nRBC: 0 % (ref 0.0–0.2)

## 2022-02-03 LAB — RESP PANEL BY RT-PCR (FLU A&B, COVID) ARPGX2
Influenza A by PCR: NEGATIVE
Influenza B by PCR: NEGATIVE
SARS Coronavirus 2 by RT PCR: NEGATIVE

## 2022-02-03 LAB — BRAIN NATRIURETIC PEPTIDE: B Natriuretic Peptide: 21 pg/mL (ref 0.0–100.0)

## 2022-02-03 MED ORDER — ONDANSETRON HCL 4 MG PO TABS: 4.0000 mg | ORAL_TABLET | Freq: Four times a day (QID) | ORAL | Status: DC | PRN

## 2022-02-03 MED ORDER — ACETAMINOPHEN 650 MG RE SUPP: 650.0000 mg | Freq: Four times a day (QID) | RECTAL | Status: DC | PRN

## 2022-02-03 MED ORDER — IPRATROPIUM-ALBUTEROL 0.5-2.5 (3) MG/3ML IN SOLN
3.0000 mL | Freq: Four times a day (QID) | RESPIRATORY_TRACT | Status: DC
  Administered 2022-02-03 – 2022-02-05 (×8): 3 mL via RESPIRATORY_TRACT
  Filled 2022-02-03 (×8): qty 3

## 2022-02-03 MED ORDER — BISACODYL 10 MG RE SUPP: 10.0000 mg | Freq: Every day | RECTAL | Status: DC | PRN

## 2022-02-03 MED ORDER — FUROSEMIDE 10 MG/ML IJ SOLN
40.0000 mg | Freq: Two times a day (BID) | INTRAMUSCULAR | Status: DC
  Administered 2022-02-03: 40 mg via INTRAVENOUS
  Filled 2022-02-03: qty 4

## 2022-02-03 MED ORDER — POLYETHYLENE GLYCOL 3350 17 G PO PACK
17.0000 g | PACK | Freq: Every day | ORAL | Status: DC | PRN
  Administered 2022-03-27 – 2022-03-31 (×2): 17 g via ORAL
  Filled 2022-02-03: qty 1

## 2022-02-03 MED ORDER — ONDANSETRON HCL 4 MG/2ML IJ SOLN: 4.0000 mg | Freq: Four times a day (QID) | INTRAMUSCULAR | Status: DC | PRN

## 2022-02-03 MED ORDER — TRAZODONE HCL 50 MG PO TABS
25.0000 mg | ORAL_TABLET | Freq: Every evening | ORAL | Status: DC | PRN
  Administered 2022-02-15 – 2022-04-05 (×28): 25 mg via ORAL
  Filled 2022-02-03 (×28): qty 1

## 2022-02-03 MED ORDER — MAGNESIUM HYDROXIDE 400 MG/5ML PO SUSP: 30.0000 mL | Freq: Every day | ORAL | Status: DC | PRN

## 2022-02-03 MED ORDER — ACETAMINOPHEN 325 MG PO TABS
650.0000 mg | ORAL_TABLET | Freq: Four times a day (QID) | ORAL | Status: DC | PRN
  Administered 2022-02-05 – 2022-03-18 (×29): 650 mg via ORAL
  Filled 2022-02-03 (×30): qty 2

## 2022-02-03 NOTE — ED Notes (Signed)
RT in room at this time. 

## 2022-02-03 NOTE — Assessment & Plan Note (Signed)
Estimated body mass index is 48.12 kg/m as calculated from the following:   Height as of this encounter: '6\' 3"'$  (1.905 m).   Weight as of this encounter: 174.6 kg.   -This will cause worsening of prognosis

## 2022-02-03 NOTE — Assessment & Plan Note (Addendum)
Patient to have some peripheral edema and concern of pulmonary vascular congestion on chest x-ray.  BNP is not suggestive of heart failure.  Slight worsening in creatinine after getting IV Lasix yesterday. -Holding IV Lasix -We will start home torsemide tomorrow if renal function stabilizes or improved. -Strict intake and output -Daily weight and BMP
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
## 2022-02-03 NOTE — Assessment & Plan Note (Addendum)
-   Continue bronchodilators and home inhalers 

## 2022-02-03 NOTE — Hospital Course (Addendum)
  Gary [name] is a 67 y.o. male with medical history significant for diastolic CHF, chronic respiratory failure status post tracheostomy, COPD, essential hypertension, PUD and sleep apnea who presented to the emergency room with acute onset of worsening dyspnea.  Apparently patient has been in the ER for the past 10 days as he was unable to be cared at home specially requirement of tracheostomy care, awaiting placement. He developed worsening pulmonary edema. Being admitted for management of acute on chronic diastolic heart failure and given IV Lasix.  Patient condition has gradually improved.  He was able to tolerate nocturnal trilogy ventilation.  Currently pending nursing placement.  12/3-15: medically stable, waiting for placement

## 2022-02-03 NOTE — ED Notes (Signed)
Pillow placed under right knee/leg

## 2022-02-03 NOTE — ED Notes (Signed)
EKG showed to D. Tamala Julian MD

## 2022-02-03 NOTE — Assessment & Plan Note (Signed)
-   We will continue PPI therapy 

## 2022-02-03 NOTE — ED Notes (Signed)
Messaged pharmacy requested them to tube Eliquis, none available in any ED pyxis.

## 2022-02-03 NOTE — Progress Notes (Signed)
Patient seen for suctioning and trach care. Tolerated interventions well. Patient placed himself on home vent without difficulty. O2 in line with vent. Will continue to monitor.

## 2022-02-03 NOTE — Progress Notes (Signed)
Progress Note   Patient: Gary [name] JXB:[id number] DOB: 1955-06-26 DOA: 01/23/2022     0 DOS: the patient was seen and examined on 02/03/2022   Brief hospital course: Taken from H&P.  Gary [name] is a 66 y.o. male with medical history significant for diastolic CHF, chronic respiratory failure status post tracheostomy, COPD, essential hypertension, PUD and sleep apnea who presented to the emergency room with acute onset of worsening dyspnea.  Apparently patient has been in the ER for the past 10 days as he was unable to be cared at home specially requirement of tracheostomy care, awaiting placement. Unfortunately he declined during his ER stay and in spite of being on torsemide developed worsening dyspnea and peripheral edema. Repeat chest x-ray with worsening pulmonary edema.  Also had dry cough and wheezing.  No fever or chills.  Being admitted for management of acute on chronic diastolic heart failure.  ED Course: Vital signs as below.  BMP revealed CO2 34 with a BUN of 38 and creatinine 1.39 compared to 26/1.13 on 10/10 and chloride was 96 compared to 101 then.  BNP was 21.   Chest x-ray showed cardiomegaly with suspected pulmonary edema compared to pulmonary vascular congestion on 10/10.   The patient was given 60 mg of IV Lasix.  10/21: Hemodynamically stable, on 28% FiO2 through trach collar, will be a difficult disposition as he requires vent at night. BMP with mild worsening of creatinine to 1.42, BUN 39, GFR at 55. BNP at 21 is not consistent with acute exacerbation. Holding IV Lasix ,ordered repeat COVID test remain negative. Repeat chest x-ray with stable cardiomegaly and concern of mild interstitial edema.     Assessment and Plan: * Acute on chronic diastolic (congestive) heart failure ([name]) Patient to have some peripheral edema and concern of pulmonary vascular congestion on chest x-ray.  BNP is not suggestive of heart failure.  Slight worsening in creatinine after  getting IV Lasix yesterday. -Holding IV Lasix -We will start home torsemide tomorrow if renal function stabilizes or improved. -Strict intake and output -Daily weight and BMP  Acute on chronic respiratory failure with hypoxia ([name]) - O2 protocol will be followed. - This like secondary to #1.  AKI (acute kidney injury) ([place]) Creatinine at 1.42 today with baseline around 1.1. Patient received IV Lasix yesterday. -Continue to monitor -Avoid nephrotoxins  Chronic obstructive pulmonary disease (COPD) ([name]) - Continue bronchodilators and home inhalers  GERD without esophagitis - We will continue PPI therapy.  Obesity, Class III, BMI 40-49.9 (morbid obesity) ([place]) Estimated body mass index is 48.12 kg/m as calculated from the following:   Height as of this encounter: '6\' 3"'$  (1.905 m).   Weight as of this encounter: 174.6 kg.   -This will cause worsening of prognosis  Anxiety and depression - We will continue Lexapro and Klonopin as well as Seroquel.  Dyslipidemia - We will continue statin therapy.   Subjective: Patient was seen and examined today.  He was stable at his baseline FiO2 with trach collar.  Stating that I have more fluid and need my torsemide.  Explained to him that we are holding Lasix today and most likely will resume from tomorrow.  Physical Exam: Vitals:   02/02/22 2213 02/03/22 0604 02/03/22 0845 02/03/22 1106  BP:  116/75  107/73  Pulse: 88 81  89  Resp: 16 18  (!) 22  Temp: 98.5 F (36.9 C) 97.9 F (36.6 C)  97.8 F (36.6 C)  TempSrc: Oral Oral  Axillary  SpO2: 97% 95% 95% 99%  Weight:      Height:       General.  Morbidly obese gentleman, in no acute distress.  Tracheostomy in place Pulmonary.  Lungs clear bilaterally, normal respiratory effort. CV.  Regular rate and rhythm, no JVD, rub or murmur. Abdomen.  Soft, nontender, nondistended, BS positive. CNS.  Alert and oriented .  No focal neurologic deficit. Extremities.  Trace LE edema, no  cyanosis, pulses intact and symmetrical. Psychiatry.  Judgment and insight appears normal.  Data Reviewed: Prior data reviewed  Family Communication:   Disposition: Status is: Inpatient Remains inpatient appropriate because: Severity of illness, patient will need placement. Difficult disposition as he need SNF bed who allowed trach vent.   Planned Discharge Destination: Skilled nursing facility  DVT prophylaxis.  Eliquis Time spent:  Minutes  This record has been created using Systems analyst. Errors have been sought and corrected,but may not always be located. Such creation errors do not reflect on the standard of care.  Author: Lorella Nimrod, MD 02/03/2022 2:22 PM  For on call review [email or web address].

## 2022-02-03 NOTE — Assessment & Plan Note (Signed)
Creatinine at 1.42 today with baseline around 1.1. Patient received IV Lasix yesterday. -Continue to monitor -Avoid nephrotoxins

## 2022-02-03 NOTE — Assessment & Plan Note (Signed)
-   O2 protocol will be followed. - This like secondary to #1.

## 2022-02-03 NOTE — Assessment & Plan Note (Signed)
-   We will continue statin therapy. 

## 2022-02-03 NOTE — ED Provider Notes (Signed)
-----------------------------------------   4:08 AM on 02/03/2022 -----------------------------------------  Followed up on the labs ordered by Dr. Archie Balboa and I ordered a chest x-ray.  I viewed and interpreted the patient's chest x-ray and it is concerning further the interval development of pulmonary edema that was not present upon his initial ED arrival.  This corresponds with his worsening respiratory status and his increased peripheral edema suggestive of acute CHF exacerbation and spite of being on his medications including torsemide.  Given that he has numerous comorbidities, tracheostomy in place, labs suggestive of a mild acute kidney injury, and need for careful ins and outs and volume monitoring, I am concerned that he may decompensate quickly and this could be avoided with hospitalization.  He has already received furosemide 60 mg IV which is a good initial dose.  I consulted Dr. Sidney Ace with the hospitalist service and explained the situation and my concern that it is in the patient's best interest to be admitted to the hospital service.  He understands and will consult on the patient and admit him.   Hinda Kehr, MD 02/03/22 [phone]

## 2022-02-03 NOTE — Assessment & Plan Note (Signed)
-   We will continue Lexapro and Klonopin as well as Seroquel.

## 2022-02-03 NOTE — ED Notes (Signed)
Bed sheets changed, and pt cleaned up

## 2022-02-03 NOTE — ED Notes (Signed)
Patient argumentative about this RN tasking.

## 2022-02-04 DIAGNOSIS — I5033 Acute on chronic diastolic (congestive) heart failure: Secondary | ICD-10-CM | POA: Diagnosis not present

## 2022-02-04 LAB — BASIC METABOLIC PANEL
Anion gap: 8 (ref 5–15)
BUN: 41 mg/dL — ABNORMAL HIGH (ref 8–23)
CO2: 31 mmol/L (ref 22–32)
Calcium: 9 mg/dL (ref 8.9–10.3)
Chloride: 101 mmol/L (ref 98–111)
Creatinine, Ser: 1.17 mg/dL (ref 0.61–1.24)
GFR, Estimated: >60 mL/min (ref >60)
Glucose, Bld: 148 mg/dL — ABNORMAL HIGH (ref 70–99)
Potassium: 3.5 mmol/L (ref 3.5–5.1)
Sodium: 140 mmol/L (ref 135–145)

## 2022-02-04 MED ORDER — TORSEMIDE 20 MG PO TABS
20.0000 mg | ORAL_TABLET | Freq: Every day | ORAL | Status: DC
  Administered 2022-02-04 – 2022-02-06 (×3): 20 mg via ORAL
  Filled 2022-02-04 (×3): qty 1

## 2022-02-04 NOTE — Assessment & Plan Note (Addendum)
Resolved after holding IV Lasix -Avoid nephrotoxins 

## 2022-02-04 NOTE — ED Notes (Signed)
Pt assisted with bedpan for bm. Pt cleansed and chuck pad changed. Pt assisted with repositioning in bed for comfort after. Pt provided with apple juice per request.

## 2022-02-04 NOTE — Assessment & Plan Note (Signed)
-   Continue bronchodilators and home inhalers 

## 2022-02-04 NOTE — ED Notes (Signed)
Called lab to draw AM labs per provider request.

## 2022-02-04 NOTE — Assessment & Plan Note (Addendum)
Patient to have some peripheral edema and concern of pulmonary vascular congestion on chest x-ray.   - Continue torsemide  - seems well compensated now.  

## 2022-02-04 NOTE — ED Notes (Signed)
Patient assisted with suctioning. New suction tubing placed on cannister afterwards. Pt denies further needs at this time.

## 2022-02-04 NOTE — Assessment & Plan Note (Addendum)
-   Currently stable on baseline which required intermittent vent support with trach collar. 

## 2022-02-04 NOTE — Progress Notes (Signed)
Progress Note   Patient: Gary [name]:[id number] DOB: 12/08/1955 DOA: 01/23/2022     1 DOS: the patient was seen and examined on 02/04/2022   Brief hospital course: Taken from H&P.  Gary [name] is a 66 y.o. male with medical history significant for diastolic CHF, chronic respiratory failure status post tracheostomy, COPD, essential hypertension, PUD and sleep apnea who presented to the emergency room with acute onset of worsening dyspnea.  Apparently patient has been in the ER for the past 10 days as he was unable to be cared at home specially requirement of tracheostomy care, awaiting placement. Unfortunately he declined during his ER stay and in spite of being on torsemide developed worsening dyspnea and peripheral edema. Repeat chest x-ray with worsening pulmonary edema.  Also had dry cough and wheezing.  No fever or chills.  Being admitted for management of acute on chronic diastolic heart failure.  ED Course: Vital signs as below.  BMP revealed CO2 34 with a BUN of 38 and creatinine 1.39 compared to 26/1.13 on 10/10 and chloride was 96 compared to 101 then.  BNP was 21.   Chest x-ray showed cardiomegaly with suspected pulmonary edema compared to pulmonary vascular congestion on 10/10.   The patient was given 60 mg of IV Lasix.  10/21: Hemodynamically stable, on 28% FiO2 through trach collar, will be a difficult disposition as he requires vent at night. BMP with mild worsening of creatinine to 1.42, BUN 39, GFR at 55. BNP at 21 is not consistent with acute exacerbation. Holding IV Lasix ,ordered repeat COVID test remain negative. Repeat chest x-ray with stable cardiomegaly and concern of mild interstitial edema.  10/22: AKI resolved after holding Lasix for 1 day, restarting home torsemide.  Respiratory status stable. Awaiting disposition.   Assessment and Plan: * Acute on chronic diastolic (congestive) heart failure ([name]) Patient to have some peripheral edema and concern  of pulmonary vascular congestion on chest x-ray.  BNP is not suggestive of heart failure.   -Restart home torsemide as renal function improved after holding IV Lasix -Strict intake and output -Daily weight and BMP  Acute on chronic respiratory failure with hypoxia ([name]) - Currently stable on baseline which required intermittent vent support with trach collar  AKI (acute kidney injury) ([place]) Resolved after holding IV Lasix -Continue to monitor -Avoid nephrotoxins  Chronic obstructive pulmonary disease (COPD) ([name]) - Continue bronchodilators and home inhalers  GERD without esophagitis - We will continue PPI therapy.  Obesity, Class III, BMI 40-49.9 (morbid obesity) ([place]) Estimated body mass index is 48.12 kg/m as calculated from the following:   Height as of this encounter: '6\' 3"'$  (1.905 m).   Weight as of this encounter: 174.6 kg.   -This will cause worsening of prognosis  Anxiety and depression - We will continue Lexapro and Klonopin as well as Seroquel.  Dyslipidemia - We will continue statin therapy.    Subjective: Patient was seen and examined today.  He was asking about his home diuretics.  No new complaints.  Physical Exam: Vitals:   02/04/22 1007 02/04/22 1240 02/04/22 1400 02/04/22 1510  BP:  122/78    Pulse:  87    Resp:  16    Temp: 98.6 F (37 C)  98.6 F (37 C)   TempSrc: Oral     SpO2:  92%  96%  Weight:      Height:       General.  Morbidly obese gentleman with trach collar, in no acute distress.  Pulmonary.  Lungs clear bilaterally, normal respiratory effort. CV.  Regular rate and rhythm, no JVD, rub or murmur. Abdomen.  Soft, nontender, nondistended, BS positive. CNS.  Alert and oriented .  No focal neurologic deficit. Extremities.  1+ LE edema, no cyanosis, pulses intact and symmetrical. Psychiatry.  Judgment and insight appears normal.   Data Reviewed: Prior data reviewed  Family Communication:   Disposition: Status is:  Inpatient Remains inpatient appropriate because: Severity of illness, patient will need placement. Difficult disposition as he need SNF bed who allowed trach vent.   Planned Discharge Destination: Skilled nursing facility  DVT prophylaxis.  Eliquis Time spent: 40 Minutes  This record has been created using Systems analyst. Errors have been sought and corrected,but may not always be located. Such creation errors do not reflect on the standard of care.  Author: Lorella Nimrod, MD 02/04/2022 3:22 PM  For on call review [email or web address].

## 2022-02-04 NOTE — ED Notes (Signed)
Pt ate meal tray. Requesting another from cafeteria due to missing food ordered. Dietary called and order placed for missing items.

## 2022-02-05 ENCOUNTER — Encounter: Payer: Self-pay | Admitting: Family Medicine

## 2022-02-05 DIAGNOSIS — I5033 Acute on chronic diastolic (congestive) heart failure: Secondary | ICD-10-CM | POA: Diagnosis not present

## 2022-02-05 LAB — GLUCOSE, CAPILLARY: Glucose-Capillary: 132 mg/dL — ABNORMAL HIGH (ref 70–99)

## 2022-02-05 LAB — MRSA NEXT GEN BY PCR, NASAL: MRSA by PCR Next Gen: NOT DETECTED

## 2022-02-05 MED ORDER — ORAL CARE MOUTH RINSE
15.0000 mL | OROMUCOSAL | Status: DC
  Administered 2022-02-05 – 2022-04-06 (×116): 15 mL via OROMUCOSAL

## 2022-02-05 MED ORDER — ORAL CARE MOUTH RINSE: 15.0000 mL | OROMUCOSAL | Status: DC | PRN

## 2022-02-05 MED ORDER — IPRATROPIUM-ALBUTEROL 0.5-2.5 (3) MG/3ML IN SOLN
3.0000 mL | RESPIRATORY_TRACT | Status: DC | PRN
  Administered 2022-02-19 – 2022-02-20 (×3): 3 mL via RESPIRATORY_TRACT
  Filled 2022-02-05 (×3): qty 3

## 2022-02-05 MED ORDER — CHLORHEXIDINE GLUCONATE CLOTH 2 % EX PADS
6.0000 | MEDICATED_PAD | Freq: Every day | CUTANEOUS | Status: DC
  Administered 2022-02-05 – 2022-02-10 (×6): 6 via TOPICAL
  Filled 2022-02-05: qty 6

## 2022-02-05 NOTE — Plan of Care (Signed)
Discussed with patient plan of care for the evening, pain management and obtaining a sandwich snack for his football game tonight with some teach back displayed.  Patient was performing range of motion exercises and moving himself up in the bed.  Still waiting on his bariatric bed.  And just admitted to today so CHG done by prior shift.  Problem: Education: Goal: Ability to demonstrate management of disease process will improve Outcome: Progressing   Problem: Activity: Goal: Capacity to carry out activities will improve Outcome: Progressing

## 2022-02-05 NOTE — Progress Notes (Signed)
Patient was received from ED around 945 am. Alert and oriented. Pleasant and complained only of pain in feet which he believes is caused by extra fluid. Tylenol was given. NSR. Trach collar in place 28%. Bariatric bed has been ordered per patient request, states it helps him to be able to turn more easily. Skin intact, but very dry and scaly on feet. Also complains of a small area on left upper posterior thigh near buttocks which itches. Area examined, minimal in size, less than 0.5 cm, and mostly scabbed; possibly abrasion. Area cleaned and left open to air. Patient ordered lunch tray. Wearing male purewick; states it was changed this am in the ED, and declines to have it changed at this time. Oral meds and inhaler given; tolerated well swallows without difficulty. Eating lunch at this time.

## 2022-02-05 NOTE — ED Notes (Addendum)
Called RT to request trach suctioning per pt request.

## 2022-02-05 NOTE — Progress Notes (Signed)
Physical Therapy Treatment Patient Details Name: Gary [name] MRN: [id number] DOB: [birth year] Today's Date: 02/05/2022   History of Present Illness Pt is a 66 y.o. 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  Prolonged admittances earlier this year for similar. MD assessment includes: Acute on chronic diastolic CHF, AKI, and acute on chronic respiratory failure with hypoxia.    PT Comments    Pt was pleasant and motivated to participate during the session and put forth good effort throughout.  Pt required significant physical assistance with all bed mobility tasks/training and even with extensive +2 assist and the bed elevated was unable to come to full upright standing.  Pt was able to clear the surface of the mattress during multiple standing attempts and during one attempt was very close to full upright position.  Pt required extensive cuing to attempt to get his center of mass further anteriorly over his feet as well as to position his feet further back toward the bed.  During multiple attempts pt's feet would slip forward secondary to poor sequencing but pt did show improvement with extensive cuing and practice.  Pt put forth very good effort with therex and truly seems extremely internally motivated to progress functionally.  Pt will benefit from PT services in a SNF setting upon discharge to safely address deficits listed in patient problem list for decreased caregiver assistance and eventual return to PLOF.      Recommendations for follow up therapy are one component of a multi-disciplinary discharge planning process, led by the attending physician.  Recommendations may be updated based on patient status, additional functional criteria and insurance authorization.  Follow Up Recommendations  Skilled nursing-short term rehab (<3 hours/day) Can patient physically be  transported by private vehicle: No   Assistance Recommended at Discharge Frequent or constant Supervision/Assistance  Patient can return home with the following Two people to help with walking and/or transfers;Two people to help with bathing/dressing/bathroom;Assistance with cooking/housework;Assist for transportation;Help with stairs or ramp for entrance   Equipment Recommendations  None recommended by PT    Recommendations for Other Services       Precautions / Restrictions Precautions Precautions: Fall Precaution Comments: cuffed trach/ trach collar Restrictions Weight Bearing Restrictions: No     Mobility  Bed Mobility Overal bed mobility: Needs Assistance Bed Mobility: Supine to Sit, Sit to Supine Rolling: Mod assist   Supine to sit: Mod assist, +2 for physical assistance Sit to supine: Max assist, +2 for safety/equipment, HOB elevated   General bed mobility comments: Mod to max A for BLE and trunk control with cues for use of bed rail as able    Transfers Overall transfer level: Needs assistance Equipment used: Rolling walker (2 wheels) Transfers: Sit to/from Stand Sit to Stand: From elevated surface, +2 physical assistance, Max assist           General transfer comment: Max multi-modal cues for foot positioning and increased trunk flexion with pt able to clear the surface of the bed during multiple attempts but unable to come to full upright standing    Ambulation/Gait               General Gait Details: Unable   Stairs             Wheelchair Mobility    Modified Rankin (Stroke Patients Only)       Balance Overall balance assessment:  Needs assistance Sitting-balance support: Feet supported Sitting balance-[name] Scale: Good         Standing balance comment: Unable to come to standing                            Cognition Arousal/Alertness: Awake/alert Behavior During Therapy: WFL for tasks assessed/performed Overall  Cognitive Status: Within Functional Limits for tasks assessed                                          Exercises Total Joint Exercises Ankle Circles/Pumps: AROM, Both, 10 reps, 15 reps (with manual resistance) Quad Sets: Strengthening, Both, 10 reps, 15 reps Gluteal Sets: Strengthening, Both, 10 reps, 15 reps Hip ABduction/ADduction: AAROM, 10 reps, Strengthening, Both Straight Leg Raises: AAROM, Both, 5 reps Other Exercises Other Exercises: Seated leg press to BLE's x 10 each with manual resistance Other Exercises: HEP education/review for BLE APs, QS, and GS x 10 each every hour with training on proper breathing technique    General Comments        Pertinent Vitals/Pain Pain Assessment Pain Assessment: 0-10 Pain Score: 8  Pain Location: bilateral feet Pain Descriptors / Indicators: Sore Pain Intervention(s): Premedicated before session, Monitored during session, Repositioned    Home Living                          Prior Function            PT Goals (current goals can now be found in the care plan section) Progress towards PT goals: Progressing toward goals    Frequency    Min 2X/week      PT Plan Current plan remains appropriate    Co-evaluation              AM-PAC PT "6 Clicks" Mobility   Outcome Measure  Help needed turning from your back to your side while in a flat bed without using bedrails?: A Lot Help needed moving from lying on your back to sitting on the side of a flat bed without using bedrails?: A Lot Help needed moving to and from a bed to a chair (including a wheelchair)?: Total Help needed standing up from a chair using your arms (e.g., wheelchair or bedside chair)?: Total Help needed to walk in hospital room?: Total Help needed climbing 3-5 steps with a railing? : Total 6 Click Score: 8    End of Session Equipment Utilized During Treatment: Oxygen;Gait belt Activity Tolerance: Patient tolerated treatment  well Patient left: in bed;with call bell/phone within reach;with bed alarm set Nurse Communication: Mobility status PT Visit Diagnosis: Unsteadiness on feet (R26.81);Muscle weakness (generalized) (M62.81);Difficulty in walking, not elsewhere classified (R26.2)     Time: 1610-9604 PT Time Calculation (min) (ACUTE ONLY): 39 min  Charges:  $Therapeutic Exercise: 8-22 mins $Therapeutic Activity: 23-37 mins                     D. Scott Alexiana Laverdure PT, DPT 02/05/22, 3:22 PM

## 2022-02-05 NOTE — Progress Notes (Signed)
Progress Note   Patient: Gary [name] [id number]:[id number] DOB: 04/26/1955 DOA: 01/23/2022     2 DOS: the patient was seen and examined on 02/05/2022   Brief hospital course: Taken from H&P.  Gary [name] is a 66 y.o. male with medical history significant for diastolic CHF, chronic respiratory failure status post tracheostomy, COPD, essential hypertension, PUD and sleep apnea who presented to the emergency room with acute onset of worsening dyspnea.  Apparently patient has been in the ER for the past 10 days as he was unable to be cared at home specially requirement of tracheostomy care, awaiting placement. Unfortunately he declined during his ER stay and in spite of being on torsemide developed worsening dyspnea and peripheral edema. Repeat chest x-ray with worsening pulmonary edema.  Also had dry cough and wheezing.  No fever or chills.  Being admitted for management of acute on chronic diastolic heart failure.  ED Course: Vital signs as below.  BMP revealed CO2 34 with a BUN of 38 and creatinine 1.39 compared to 26/1.13 on 10/10 and chloride was 96 compared to 101 then.  BNP was 21.   Chest x-ray showed cardiomegaly with suspected pulmonary edema compared to pulmonary vascular congestion on 10/10.   The patient was given 60 mg of IV Lasix.  10/21: Hemodynamically stable, on 28% FiO2 through trach collar, will be a difficult disposition as he requires vent at night. BMP with mild worsening of creatinine to 1.42, BUN 39, GFR at 55. BNP at 21 is not consistent with acute exacerbation. Holding IV Lasix ,ordered repeat COVID test remain negative. Repeat chest x-ray with stable cardiomegaly and concern of mild interstitial edema.  10/22: AKI resolved after holding Lasix for 1 day, restarting home torsemide.  Respiratory status stable. Awaiting disposition.  10/23: Remained medically stable, still pending disposition   Assessment and Plan: * Acute on chronic diastolic (congestive) heart  failure ([place]) Patient to have some peripheral edema and concern of pulmonary vascular congestion on chest x-ray.  BNP is not suggestive of heart failure.   -Restart home torsemide as renal function improved after holding IV Lasix -Strict intake and output -Daily weight and BMP  Acute on chronic respiratory failure with hypoxia ([name]) - Currently stable on baseline which required intermittent vent support with trach collar  AKI (acute kidney injury) ([place]) Resolved after holding IV Lasix -Continue to monitor -Avoid nephrotoxins  Chronic obstructive pulmonary disease (COPD) ([name]) - Continue bronchodilators and home inhalers  GERD without esophagitis - We will continue PPI therapy.  Obesity, Class III, BMI 40-49.9 (morbid obesity) ([place]) Estimated body mass index is 48.12 kg/m as calculated from the following:   Height as of this encounter: '6\' 3"'$  (1.905 m).   Weight as of this encounter: 174.6 kg.   -This will cause worsening of prognosis  Anxiety and depression - We will continue Lexapro and Klonopin as well as Seroquel.  Dyslipidemia - We will continue statin therapy.    Subjective: Patient was seen and examined today.  No new complaints.  Physical Exam: Vitals:   02/05/22 1001 02/05/22 1100 02/05/22 1200 02/05/22 1400  BP: 107/69 112/70 111/65 103/69  Pulse: 83 83 82 82  Resp: 13   18  Temp:      TempSrc:      SpO2: 98% 94% 96% (!) 88%  Weight:      Height:       General.  Morbidly obese gentleman with trach,    in no acute distress. Pulmonary.  Lungs  clear bilaterally, normal respiratory effort. CV.  Regular rate and rhythm, no JVD, rub or murmur. Abdomen.  Soft, nontender, nondistended, BS positive. CNS.  Alert and oriented .  No focal neurologic deficit. Extremities.  Trace LE edema, no cyanosis, pulses intact and symmetrical. Psychiatry.  Judgment and insight appears normal.   Data Reviewed: Prior data reviewed  Family Communication:    Disposition: Status is: Inpatient Remains inpatient appropriate because: Severity of illness, patient will need placement. Difficult disposition as he need SNF bed who allowed trach vent.   Planned Discharge Destination: Skilled nursing facility  DVT prophylaxis.  Eliquis Time spent: 35 Minutes  This record has been created using Systems analyst. Errors have been sought and corrected,but may not always be located. Such creation errors do not reflect on the standard of care.  Author: Lorella Nimrod, MD 02/05/2022 2:24 PM  For on call review [email or web address].

## 2022-02-05 NOTE — ED Notes (Signed)
This RN, transport and RT bring patient upstairs. Leave ED in stable condition. Monitors in place. All belongings brought upstairs with patient.

## 2022-02-06 DIAGNOSIS — I5033 Acute on chronic diastolic (congestive) heart failure: Secondary | ICD-10-CM | POA: Diagnosis not present

## 2022-02-06 MED ORDER — TORSEMIDE 20 MG PO TABS: 40.0000 mg | ORAL_TABLET | Freq: Every day | ORAL | Status: DC

## 2022-02-06 MED ORDER — TORSEMIDE 20 MG PO TABS
20.0000 mg | ORAL_TABLET | Freq: Once | ORAL | Status: AC
  Administered 2022-02-06: 20 mg via ORAL
  Filled 2022-02-06: qty 1

## 2022-02-06 NOTE — Progress Notes (Signed)
Progress Note   Patient: Gary [name] [id number]:[id number] DOB: [birth year] DOA: 01/23/2022     3 DOS: the patient was seen and examined on 02/06/2022   Brief hospital course: Taken from H&P.  Gary [name] is a 66 y.o. male with medical history significant for diastolic CHF, chronic respiratory failure status post tracheostomy, COPD, essential hypertension, PUD and sleep apnea who presented to the emergency room with acute onset of worsening dyspnea.  Apparently patient has been in the ER for the past 10 days as he was unable to be cared at home specially requirement of tracheostomy care, awaiting placement. Unfortunately he declined during his ER stay and in spite of being on torsemide developed worsening dyspnea and peripheral edema. Repeat chest x-ray with worsening pulmonary edema.  Also had dry cough and wheezing.  No fever or chills.  Being admitted for management of acute on chronic diastolic heart failure.  ED Course: Vital signs as below.  BMP revealed CO2 34 with a BUN of 38 and creatinine 1.39 compared to 26/1.13 on 10/10 and chloride was 96 compared to 101 then.  BNP was 21.   Chest x-ray showed cardiomegaly with suspected pulmonary edema compared to pulmonary vascular congestion on 10/10.   The patient was given 60 mg of IV Lasix.  10/21: Hemodynamically stable, on 28% FiO2 through trach collar, will be a difficult disposition as he requires vent at night. BMP with mild worsening of creatinine to 1.42, BUN 39, GFR at 55. BNP at 21 is not consistent with acute exacerbation. Holding IV Lasix ,ordered repeat COVID test remain negative. Repeat chest x-ray with stable cardiomegaly and concern of mild interstitial edema.  10/22: AKI resolved after holding Lasix for 1 day, restarting home torsemide.  Respiratory status stable. Awaiting disposition.  10/23: Remained medically stable, still pending disposition.  Slight worsening of lower extremity edema today, weight seems stable.   Increasing the dose of torsemide from 20 to 40 mg daily.   Assessment and Plan: * Acute on chronic diastolic (congestive) heart failure ([name]) Patient to have some peripheral edema and concern of pulmonary vascular congestion on chest x-ray.  BNP is not suggestive of heart failure.   -Increase the dose of torsemide to 40 mg daily -Strict intake and output -Daily weight and BMP  Acute on chronic respiratory failure with hypoxia ([name]) - Currently stable on baseline which required intermittent vent support with trach collar  AKI (acute kidney injury) ([place]) Resolved after holding IV Lasix -Continue to monitor -Avoid nephrotoxins  Chronic obstructive pulmonary disease (COPD) ([name]) - Continue bronchodilators and home inhalers  GERD without esophagitis - We will continue PPI therapy.  Obesity, Class III, BMI 40-49.9 (morbid obesity) ([place]) Estimated body mass index is 48.12 kg/m as calculated from the following:   Height as of this encounter: '6\' 3"'$  (1.905 m).   Weight as of this encounter: 174.6 kg.   -This will cause worsening of prognosis  Anxiety and depression - We will continue Lexapro and Klonopin as well as Seroquel.  Dyslipidemia - We will continue statin therapy.    Subjective: Patient was requesting to talk with social worker when seen today.  Denies any pain or shortness of breath.  He thinks that his legs are getting heavier.  Physical Exam: Vitals:   02/06/22 0600 02/06/22 0800 02/06/22 1000 02/06/22 1019  BP:    (!) 98/59  Pulse: 69 66 78 84  Resp: 20 18 (!) 21 19  Temp:      TempSrc:  SpO2: 98% 100% 95% 95%  Weight:      Height:       General.  Morbidly obese gentleman with cuffed trach, in no acute distress. Pulmonary.  Lungs clear bilaterally, normal respiratory effort. CV.  Regular rate and rhythm, no JVD, rub or murmur. Abdomen.  Soft, nontender, nondistended, BS positive. CNS.  Alert and oriented .  No focal neurologic deficit. Extremities.   1+ LE edema, no cyanosis, pulses intact and symmetrical. Psychiatry.  Judgment and insight appears normal.   Data Reviewed: Prior data reviewed  Family Communication: Discussed with patient  Disposition: Status is: Inpatient Remains inpatient appropriate because: Severity of illness, patient will need placement. Difficult disposition as he need SNF bed who allowed trach vent.   Planned Discharge Destination: Skilled nursing facility  DVT prophylaxis.  Eliquis Time spent: 40 Minutes  This record has been created using Systems analyst. Errors have been sought and corrected,but may not always be located. Such creation errors do not reflect on the standard of care.  Author: Lorella Nimrod, MD 02/06/2022 12:18 PM  For on call review [email or web address].

## 2022-02-06 NOTE — Progress Notes (Signed)
SW spoke to pt and pt's SO/primary caregiver [name] [phone]. Provided update on search for vent SNF. Pt and SO understand barriers to SNF placement for rehab (vent at [facility], Medicaid only payor). No bed offers at this time. Will continue efforts.   Wandra Feinstein, MSW, LCSW [phone] (coverage)

## 2022-02-07 DIAGNOSIS — I5033 Acute on chronic diastolic (congestive) heart failure: Secondary | ICD-10-CM | POA: Diagnosis not present

## 2022-02-07 LAB — BASIC METABOLIC PANEL
Anion gap: 9 (ref 5–15)
BUN: 39 mg/dL — ABNORMAL HIGH (ref 8–23)
CO2: 33 mmol/L — ABNORMAL HIGH (ref 22–32)
Calcium: 9.1 mg/dL (ref 8.9–10.3)
Chloride: 99 mmol/L (ref 98–111)
Creatinine, Ser: 1.51 mg/dL — ABNORMAL HIGH (ref 0.61–1.24)
GFR, Estimated: 51 mL/min — ABNORMAL LOW (ref >60)
Glucose, Bld: 127 mg/dL — ABNORMAL HIGH (ref 70–99)
Potassium: 3.7 mmol/L (ref 3.5–5.1)
Sodium: 141 mmol/L (ref 135–145)

## 2022-02-07 LAB — CBC
HCT: 37.5 % — ABNORMAL LOW (ref 39.0–52.0)
Hemoglobin: 10 g/dL — ABNORMAL LOW (ref 13.0–17.0)
MCH: 20.9 pg — ABNORMAL LOW (ref 26.0–34.0)
MCHC: 26.7 g/dL — ABNORMAL LOW (ref 30.0–36.0)
MCV: 78.3 fL — ABNORMAL LOW (ref 80.0–100.0)
Platelets: 232 10*3/uL (ref 150–400)
RBC: 4.79 MIL/uL (ref 4.22–5.81)
RDW: 17.9 % — ABNORMAL HIGH (ref 11.5–15.5)
WBC: 7.9 10*3/uL (ref 4.0–10.5)
nRBC: 0 % (ref 0.0–0.2)

## 2022-02-07 MED ORDER — TORSEMIDE 20 MG PO TABS
20.0000 mg | ORAL_TABLET | Freq: Every day | ORAL | Status: DC
  Administered 2022-02-08 – 2022-02-16 (×9): 20 mg via ORAL
  Filled 2022-02-07 (×8): qty 1

## 2022-02-07 NOTE — Progress Notes (Signed)
Progress Note   Patient: Gary [name]:[id number] DOB: 1955-12-09 DOA: 01/23/2022     4 DOS: the patient was seen and examined on 02/07/2022   Brief hospital course: Taken from H&P.  Gary [name] is a 66 y.o. male with medical history significant for diastolic CHF, chronic respiratory failure status post tracheostomy, COPD, essential hypertension, PUD and sleep apnea who presented to the emergency room with acute onset of worsening dyspnea.  Apparently patient has been in the ER for the past 10 days as he was unable to be cared at home specially requirement of tracheostomy care, awaiting placement. Unfortunately he declined during his ER stay and in spite of being on torsemide developed worsening dyspnea and peripheral edema. Repeat chest x-ray with worsening pulmonary edema.  Also had dry cough and wheezing.  No fever or chills.  Being admitted for management of acute on chronic diastolic heart failure.  ED Course: Vital signs as below.  BMP revealed CO2 34 with a BUN of 38 and creatinine 1.39 compared to 26/1.13 on 10/10 and chloride was 96 compared to 101 then.  BNP was 21.   Chest x-ray showed cardiomegaly with suspected pulmonary edema compared to pulmonary vascular congestion on 10/10.   The patient was given 60 mg of IV Lasix.  10/21: Hemodynamically stable, on 28% FiO2 through trach collar, will be a difficult disposition as he requires vent at night. BMP with mild worsening of creatinine to 1.42, BUN 39, GFR at 55. BNP at 21 is not consistent with acute exacerbation. Holding IV Lasix ,ordered repeat COVID test remain negative. Repeat chest x-ray with stable cardiomegaly and concern of mild interstitial edema.  10/22: AKI resolved after holding Lasix for 1 day, restarting home torsemide.  Respiratory status stable. Awaiting disposition.  10/23: Remained medically stable, still pending disposition.  Slight worsening of lower extremity edema today, weight seems stable.   Increasing the dose of torsemide from 20 to 40 mg daily.  10/24: Patient medically stable, worsening of creatinine with increased dose of torsemide, holding torsemide for today and we will resume home dose of 20 mg from tomorrow. Patient would like to reconsider the need of trach.  Discussed that he need to lose some weight as that will be helpful if he wants to get rid of this trach.  Still pending disposition   Assessment and Plan: * Acute on chronic diastolic (congestive) heart failure ([name]) Patient to have some peripheral edema and concern of pulmonary vascular congestion on chest x-ray.  BNP is not suggestive of heart failure.   Some worsening of creatinine after increasing torsemide dose to 40. -Holding torsemide today and we will resume at baseline dose of 20 mg daily from tomorrow -Strict intake and output -Daily weight and BMP  Acute on chronic respiratory failure with hypoxia ([name]) - Currently stable on baseline which required intermittent vent support with trach collar  AKI (acute kidney injury) ([place]) Resolved after holding IV Lasix -Continue to monitor -Avoid nephrotoxins  Chronic obstructive pulmonary disease (COPD) ([name]) - Continue bronchodilators and home inhalers  GERD without esophagitis - We will continue PPI therapy.  Obesity, Class III, BMI 40-49.9 (morbid obesity) ([place]) Estimated body mass index is 48.12 kg/m as calculated from the following:   Height as of this encounter: '6\' 3"'$  (1.905 m).   Weight as of this encounter: 174.6 kg.   -This will cause worsening of prognosis  Anxiety and depression - We will continue Lexapro and Klonopin as well as Seroquel.  Dyslipidemia -  We will continue statin therapy.  Subjective: Patient was seen and examined today.  No new complaint.  He was asking about the need of continuation with trach, stating that he did not had problems with excessive daytime sleepiness since he started using Trelegy at night.  Physical  Exam: Vitals:   02/07/22 0808 02/07/22 0900 02/07/22 1000 02/07/22 1147  BP:   95/64   Pulse: 75 81 79 79  Resp: '17 15 20 18  '$ Temp:      TempSrc:      SpO2: 90% 94% 92% 94%  Weight:      Height:       General.  Morbidly obese gentleman, in no acute distress.  Cough trach in place Pulmonary.  Lungs clear bilaterally, normal respiratory effort. CV.  Regular rate and rhythm, no JVD, rub or murmur. Abdomen.  Soft, nontender, nondistended, BS positive. CNS.  Alert and oriented .  No focal neurologic deficit. Extremities.  Trace LE edema, no cyanosis, pulses intact and symmetrical. Psychiatry.  Judgment and insight appears normal.   Data Reviewed: Prior data reviewed  Family Communication: Discussed with patient  Disposition: Status is: Inpatient Remains inpatient appropriate because: Severity of illness, patient will need placement. Difficult disposition as he need SNF bed who allowed trach vent.   Planned Discharge Destination: Skilled nursing facility  DVT prophylaxis.  Eliquis Time spent: 41 Minutes  This record has been created using Systems analyst. Errors have been sought and corrected,but may not always be located. Such creation errors do not reflect on the standard of care.  Author: Lorella Nimrod, MD 02/07/2022 1:16 PM  For on call review [email or web address].

## 2022-02-07 NOTE — Progress Notes (Signed)
Physical Therapy Treatment Patient Details Name: Gary [name] MRN: [id number] DOB: 1955-06-01 Today's Date: 02/07/2022   History of Present Illness Pt is a 66 y.o. 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  Prolonged admittances earlier this year for similar. MD assessment includes: Acute on chronic diastolic CHF, AKI, and acute on chronic respiratory failure with hypoxia.    PT Comments    Pt was long sitting in bed upon arriving. " I've been doing my exercises." Author continues to educate pt on importance of performing multiple times daily. He states understanding. See exercises pt performed with author listed below. Pt was agreeable to attempt standing however severely limited by fear. He stood 1 x but with poor posture. Author questions if fear is limiting pt more so than strength. As soon as pt returned to supine he said," I'm gonna stand up tomorrow." Educated pt on fear of falling is natural and that he will need to get over that fear to achieve his personal goals. Author will return tomorrow and continue to follow and progress as able per current POC.SNF at DC most appropriate to maximize his independence while decreasing caregiver burden.    Recommendations for follow up therapy are one component of a multi-disciplinary discharge planning process, led by the attending physician.  Recommendations may be updated based on patient status, additional functional criteria and insurance authorization.  Follow Up Recommendations  Skilled nursing-short term rehab (<3 hours/day) Can patient physically be transported by private vehicle: No   Assistance Recommended at Discharge Frequent or constant Supervision/Assistance  Patient can return home with the following Two people to help with walking and/or transfers;Two people to help with bathing/dressing/bathroom;Assistance with  cooking/housework;Assist for transportation;Help with stairs or ramp for entrance   Equipment Recommendations  None recommended by PT       Precautions / Restrictions Precautions Precautions: Fall Precaution Comments: cuffed trach/ trach collar Restrictions Weight Bearing Restrictions: No     Mobility  Bed Mobility Overal bed mobility: Needs Assistance Bed Mobility: Supine to Sit, Sit to Supine Rolling: Max assist   Supine to sit: Max assist Sit to supine: Max assist   General bed mobility comments: Max assist required to exit R side of bed with increased time and heavy use of bed functions    Transfers Overall transfer level: Needs assistance Equipment used: Rolling walker (2 wheels) (bariatric) Transfers: Sit to/from Stand Sit to Stand: Max assist, Total assist, +2 physical assistance, +2 safety/equipment, From elevated surface    General transfer comment: Pt attempted standing 3 times however only able to clear hips one time. Very poor effort. Fear limits pt's abilities. Pt stood better last week. Discussed that it is normal to have fear of falling. Pt has severe posterior lean upon attempting to stand. " I want to try again tomorrow."    Ambulation/Gait    General Gait Details: unable   Balance Overall balance assessment: Needs assistance Sitting-balance support: Feet supported Sitting balance-[name] Scale: Good Sitting balance - Comments: no LOB once seated EOB with BLE on floor.     Standing balance-[name] Scale: Zero       Cognition Arousal/Alertness: Awake/alert Behavior During Therapy: WFL for tasks assessed/performed Overall Cognitive Status: Within Functional Limits for tasks assessed    Following Commands: Follows one step commands with increased time, Follows one step commands consistently Safety/Judgement: Decreased awareness of safety, Decreased awareness of deficits  Problem Solving: Slow processing, Requires verbal cues, Requires tactile  cues General Comments: Pt A and O x 4. Have fear of falling that is limiting his progress with therapy. Does do exercises in bed prior to attempting standing EOB.        Exercises Total Joint Exercises Ankle Circles/Pumps: AROM, 10 reps Quad Sets: AROM, 10 reps Gluteal Sets: 10 reps Heel Slides: AROM, Left, 10 reps, AAROM Hip ABduction/ADduction: AROM, 10 reps Straight Leg Raises: AAROM, 10 reps, Both    General Comments General comments (skin integrity, edema, etc.): pt performed supine exercises. Performed tricep extentions and horizontal abd/add      Pertinent Vitals/Pain Pain Assessment Pain Assessment: 0-10 Pain Score: 3  Pain Location: Bilateral feet pain Pain Descriptors / Indicators: Sore Pain Intervention(s): Limited activity within patient's tolerance, Monitored during session, Premedicated before session, Repositioned     PT Goals (current goals can now be found in the care plan section) Acute Rehab PT Goals Patient Stated Goal: " I want to be able to walk by thanksgiving." Progress towards PT goals: Progressing toward goals    Frequency    Min 2X/week      PT Plan Current plan remains appropriate       AM-PAC PT "6 Clicks" Mobility   Outcome Measure  Help needed turning from your back to your side while in a flat bed without using bedrails?: A Lot Help needed moving from lying on your back to sitting on the side of a flat bed without using bedrails?: A Lot Help needed moving to and from a bed to a chair (including a wheelchair)?: Total Help needed standing up from a chair using your arms (e.g., wheelchair or bedside chair)?: Total Help needed to walk in hospital room?: Total Help needed climbing 3-5 steps with a railing? : Total 6 Click Score: 8    End of Session Equipment Utilized During Treatment: Oxygen;Gait belt Activity Tolerance: Patient tolerated treatment well Patient left: in bed;with call bell/phone within reach;with bed alarm set Nurse  Communication: Mobility status PT Visit Diagnosis: Unsteadiness on feet (R26.81);Muscle weakness (generalized) (M62.81);Difficulty in walking, not elsewhere classified (R26.2)     Time: 8101-7510 PT Time Calculation (min) (ACUTE ONLY): 44 min  Charges:  $Therapeutic Exercise: 23-37 mins $Therapeutic Activity: 8-22 mins                     Julaine Fusi PTA 02/07/22, 3:30 PM

## 2022-02-08 DIAGNOSIS — I5033 Acute on chronic diastolic (congestive) heart failure: Secondary | ICD-10-CM | POA: Diagnosis not present

## 2022-02-08 MED ORDER — OXYCODONE HCL 5 MG PO TABS
5.0000 mg | ORAL_TABLET | Freq: Four times a day (QID) | ORAL | Status: DC | PRN
  Administered 2022-02-08 – 2022-02-22 (×16): 5 mg via ORAL
  Filled 2022-02-08 (×16): qty 1

## 2022-02-08 MED ORDER — AMMONIUM LACTATE 12 % EX LOTN
TOPICAL_LOTION | Freq: Two times a day (BID) | CUTANEOUS | Status: DC
  Administered 2022-02-12 – 2022-03-23 (×5): 1 via TOPICAL
  Filled 2022-02-08 (×3): qty 400

## 2022-02-08 NOTE — Progress Notes (Signed)
[facility] Progress Note  Patient: Gary [name]    OVF:[id number]  DOA: 01/23/2022     Date of Service: the patient was seen and examined on 02/08/2022  Chief Complaint  Patient presents with   Shortness of Breath   Brief hospital course: Taken from H&P.   Gary [name] is a 66 y.o. male with medical history significant for diastolic CHF, chronic respiratory failure status post tracheostomy, COPD, essential hypertension, PUD and sleep apnea who presented to the emergency room with acute onset of worsening dyspnea.  Apparently patient has been in the ER for the past 10 days as he was unable to be cared at home specially requirement of tracheostomy care, awaiting placement. Unfortunately he declined during his ER stay and in spite of being on torsemide developed worsening dyspnea and peripheral edema. Repeat chest x-ray with worsening pulmonary edema.  Also had dry cough and wheezing.  No fever or chills.   Being admitted for management of acute on chronic diastolic heart failure.   ED Course: Vital signs as below.  BMP revealed CO2 34 with a BUN of 38 and creatinine 1.39 compared to 26/1.13 on 10/10 and chloride was 96 compared to 101 then.  BNP was 21.    Chest x-ray showed cardiomegaly with suspected pulmonary edema compared to pulmonary vascular congestion on 10/10.   The patient was given 60 mg of IV Lasix.   10/21: Hemodynamically stable, on 28% FiO2 through trach collar, will be a difficult disposition as he requires vent at night. BMP with mild worsening of creatinine to 1.42, BUN 39, GFR at 55. BNP at 21 is not consistent with acute exacerbation. Holding IV Lasix ,ordered repeat COVID test remain negative. Repeat chest x-ray with stable cardiomegaly and concern of mild interstitial edema.   10/22: AKI resolved after holding Lasix for 1 day, restarting home torsemide.  Respiratory status stable. Awaiting disposition.   10/23: Remained medically stable, still pending  disposition.  Slight worsening of lower extremity edema today, weight seems stable.  Increasing the dose of torsemide from 20 to 40 mg daily.   10/24: Patient medically stable, worsening of creatinine with increased dose of torsemide, holding torsemide for today and we will resume home dose of 20 mg from tomorrow. Patient would like to reconsider the need of trach.  Discussed that he need to lose some weight as that will be helpful if he wants to get rid of this trach.  Still pending disposition    Assessment and Plan:  # Acute on chronic diastolic (congestive) heart failure  Patient to have some peripheral edema and concern of pulmonary vascular congestion on chest x-ray.  BNP is not suggestive of heart failure.   Some worsening of creatinine after increasing torsemide dose to 40. -Held torsemide on 10/25 and resumed at baseline dose of 20 mg daily from 10/26  -Strict intake and output -Daily weight and BMP Cr 1.17--1.51 elevated today Daughter was advised to do bladder scan to rule out urine retention   Acute on chronic respiratory failure with hypoxia  - Currently stable on baseline which required intermittent vent support with trach collar 10/26 patient want to remove the tracheostomy tube, interested in the reversal.  Advised to follow with pulmonologist and ENT for further management    AKI (acute kidney injury) -Continue to monitor -Avoid nephrotoxins Creatinine is fluctuating, again elevated creatinine 1.5 today Lasix was discontinued and patient is currently on torsemide 20 mg p.o. daily   Chronic obstructive pulmonary disease (  COPD) ([place]) - Continue bronchodilators and home inhalers   GERD without esophagitis - We will continue PPI therapy.   Obesity, Class III, BMI 40-49.9 (morbid obesity) ([place]) Estimated body mass index is 48.12 kg/m as calculated from the following:   Height as of this encounter: '6\' 3"'$  (1.905 m).   Weight as of this encounter: 174.6 kg.     -This will cause worsening of prognosis   Anxiety and depression - We will continue Lexapro and Klonopin as well as Seroquel.   Dyslipidemia - We will continue statin therapy.        Diet: Heart healthy diet DVT Prophylaxis: Therapeutic Anticoagulation with Eliquis    Advance goals of care discussion: Full code  Family Communication: family was NOT present at bedside, at the time of interview.  The pt provided permission to discuss medical plan with the family. Opportunity was given to ask question and all questions were answered satisfactorily.     Disposition:  Pt is from Home, admitted with Resp Failure and edema, still has edema and resp failure, s/p Trach and using vent at night, which precludes a safe discharge. Discharge to SNF, when bed available. Difficult disposition as he need SNF bed who allowed trach vent.  Subjective: No significant events overnight, patient was complaining of pain in the bilateral feet, having difficulty standing up and requesting for pain medication. Denied any worsening of shortness of breath, no chest pain or palpitations, no abdominal pain.  Physical Exam: General:  alert oriented to time, place, and person.  Appear in mild distress, affect appropriate Eyes: PERRLA ENT: Oral Mucosa Clear, moist  Neck: no JVD,  Cardiovascular: S1 and S2 Present, no Murmur,  Respiratory: good respiratory effort, Bilateral Air entry equal and Decreased, mild Crackles, no wheezes Abdomen: Bowel Sound present, Soft and no tenderness,  Skin: no rashes Extremities: mild Pedal edema, no calf tenderness, very dry soles Neurologic: without any new focal findings Gait not checked due to patient safety concerns  Vitals:   02/08/22 0821 02/08/22 1000 02/08/22 1200 02/08/22 1300  BP: 105/68   102/69  Pulse: 80 79 77 78  Resp: 15 (!) 24 (!) 22 14  Temp:      TempSrc:      SpO2: 94% 94% 95% 96%  Weight:      Height:        Intake/Output Summary (Last 24  hours) at 02/08/2022 1536 Last data filed at 02/08/2022 1300 Gross per 24 hour  Intake 200 ml  Output 2250 ml  Net -2050 ml   Filed Weights   02/05/22 2100 02/06/22 0422 02/08/22 0500  Weight: (!) 174.6 kg (!) 174.6 kg (!) 188.7 kg    Data Reviewed: I have personally reviewed and interpreted daily labs, tele strips, imagings as discussed above. I reviewed all nursing notes, pharmacy notes, vitals, pertinent old records I have discussed plan of care as described above with RN and patient/family.  CBC: Recent Labs  Lab 02/03/22 0609 02/07/22 0632  WBC 7.1 7.9  HGB 10.5* 10.0*  HCT 38.8* 37.5*  MCV 77.6* 78.3*  PLT 196 154   Basic Metabolic Panel: Recent Labs  Lab 02/03/22 0105 02/03/22 0609 02/04/22 0943 02/07/22 0632  NA 136 139 140 141  K 4.2 4.0 3.5 3.7  CL 96* 97* 101 99  CO2 34* 34* 31 33*  GLUCOSE 123* 110* 148* 127*  BUN 38* 39* 41* 39*  CREATININE 1.39* 1.42* 1.17 1.51*  CALCIUM 8.7* 9.1 9.0 9.1  Studies: No results found.  Scheduled Meds:  ammonium lactate   Topical BID   apixaban  2.5 mg Oral BID   Chlorhexidine Gluconate Cloth  6 each Topical Daily   diclofenac Sodium  2 g Topical TID   escitalopram  5 mg Oral Daily   ferrous sulfate  325 mg Oral BID WC   gabapentin  400 mg Oral Q8H   Ipratropium-Albuterol  2 puff Inhalation BID   melatonin  5 mg Oral QHS   mometasone-formoterol  2 puff Inhalation BID   mouth rinse  15 mL Mouth Rinse 4 times per day   pantoprazole  40 mg Oral BID AC   potassium chloride  10 mEq Oral Daily   simvastatin  10 mg Oral q1800   torsemide  20 mg Oral Daily   Continuous Infusions: PRN Meds: acetaminophen **OR** acetaminophen, benzonatate, bisacodyl, clonazePAM, ipratropium-albuterol, magnesium hydroxide, ondansetron **OR** ondansetron (ZOFRAN) IV, mouth rinse, oxyCODONE, polyethylene glycol, QUEtiapine, traZODone  Time spent: 35 minutes  Author: Val Riles. MD [facility] 02/08/2022 3:36 PM  To  reach On-call, see care teams to locate the attending and reach out to them via [email or web address]. If 7PM-7AM, please contact night-coverage If you still have difficulty reaching the attending provider, please page the [facility] (Director on Call) for [facility] on amion for assistance.

## 2022-02-08 NOTE — TOC Progression Note (Signed)
Transition of Care [facility]) - Progression Note    Patient Details  Name: Gary [name] MRN: [id number] Date of Birth: [birth year]  Transition of Care [facility]) CM/SW Contact  Shelbie Hutching, RN Phone Number: 02/08/2022, 3:12 PM  Clinical Narrative:    No bed offers received at this time, re-submitted information to facilities that were considering referral.     Expected Discharge Plan: [place] Barriers to Discharge: Continued Medical Work up, Ship broker, Unsafe home situation  Expected Discharge Plan and Services Expected Discharge Plan: [place] In-house Referral: Clinical Social Work     Living arrangements for the past 2 months: Single Family Home                                       Social Determinants of Health (SDOH) Interventions    Readmission Risk Interventions    12/31/2021    8:58 AM 12/20/2021    4:21 PM 05/04/2021    1:26 PM  Readmission Risk Prevention Plan  Transportation Screening Complete Complete Complete  Medication Review Press photographer) Complete Complete Complete  PCP or Specialist appointment within 3-5 days of discharge Complete Complete Complete  HRI or Home Care Consult Complete Complete Complete  SW Recovery Care/Counseling Consult Complete Complete Complete  Palliative Care Screening Complete Complete Not [place] Not Applicable Not Applicable Not Applicable

## 2022-02-08 NOTE — Progress Notes (Signed)
Physical Therapy Treatment Patient Details Name: PHINNEAS SHAKOOR MRN: [id number] DOB: Nov 18, 1955 Today's Date: 02/08/2022   History of Present Illness Pt is a 66 y.o. 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  Prolonged admittances earlier this year for similar. MD assessment includes: Acute on chronic diastolic CHF, AKI, and acute on chronic respiratory failure with hypoxia.    PT Comments    Pt was sitting in dark room watching TV upon arriving. He is A and O and agreeable to session. Endorsing L posterior foot pain that resolved throughout session. Pt stated he is requesting more pain medication however author encouraged him to stick to current levels. PT puts forth great effort today but was unwilling to attempt transfers." I promise I'll be ready to stand tomorrow." Pt was fulling engaged and fully/actively participated in there ex in bed. He tolerated bed place in reverse trendelenburg and using footplate to perform semi-squats with BLEs and even advancing to single leg mini squats. See other exercises performed listed below. Overall pt is demonstrating effort to improve and has been willing to participate in at least there ex in bed ever session. Will attempt standing tomorrow with +2 assistance. Green bariatric RW left in room for use when attempting transfers. Author continues to recommend use of mechanical lift  for any OOB activity.    Recommendations for follow up therapy are one component of a multi-disciplinary discharge planning process, led by the attending physician.  Recommendations may be updated based on patient status, additional functional criteria and insurance authorization.  Follow Up Recommendations  Skilled nursing-short term rehab (<3 hours/day)     Assistance Recommended at Discharge Frequent or constant Supervision/Assistance  Patient can return home  with the following Two people to help with walking and/or transfers;Two people to help with bathing/dressing/bathroom;Assistance with cooking/housework;Assist for transportation;Help with stairs or ramp for entrance   Equipment Recommendations  None recommended by PT       Precautions / Restrictions Precautions Precautions: Fall Precaution Comments: cuffed trach/ trach collar (trach collar) Restrictions Weight Bearing Restrictions: No     Mobility  Bed Mobility  General bed mobility comments: pt did not exit to EOB today. See exercises performed listed below.    Transfers  General transfer comment: Pt states he will be willing to perform transfer attempts at 2pm. Author did lean green bariatric RW for standing trials in room.          Cognition Arousal/Alertness: Awake/alert Behavior During Therapy: WFL for tasks assessed/performed Overall Cognitive Status: Within Functional Limits for tasks assessed  Following Commands: Follows one step commands with increased time, Follows one step commands consistently       General Comments: Pt is A and O and continues to be motivated overall with less encouargement required to participate. He c/o L dorsal foot pain and requested not to get OOB but is willing to attempt transfers next date.           General Comments General comments (skin integrity, edema, etc.): Author place bed in reverse trendelenberg position and had pt push off footboard to performed semi squats. Pt easily able to perform so advanced to single leg squats in same position. He does continue to demonstrate abilities to independently perform UEs ther ex and LE exercises. Active assisted exercies for SLR, RLE heel slides however pt is able to actively perform quad sets, glut sets, hip  abduction and tricep extension with red theraband, horizontal shoulder abduction agains red theraband resistance. Overall pt had productive session performing exercises but is encouraged to  attempt standing tomorrow.      Pertinent Vitals/Pain Pain Assessment Pain Assessment: 0-10 Pain Score: 4  Pain Location: Left dorsal foot Pain Descriptors / Indicators: Sore Pain Intervention(s): Limited activity within patient's tolerance, Monitored during session, Premedicated before session, Repositioned (Pt requesting MD for stronger pain medications. Author encouraged pt to try to stick to NSAIDs)     PT Goals (current goals can now be found in the care plan section) Acute Rehab PT Goals Patient Stated Goal: " I want to be able to walk again Progress towards PT goals: Progressing toward goals    Frequency    Min 2X/week      PT Plan Current plan remains appropriate       AM-PAC PT "6 Clicks" Mobility   Outcome Measure  Help needed turning from your back to your side while in a flat bed without using bedrails?: A Lot Help needed moving from lying on your back to sitting on the side of a flat bed without using bedrails?: Total Help needed moving to and from a bed to a chair (including a wheelchair)?: Total Help needed standing up from a chair using your arms (e.g., wheelchair or bedside chair)?: Total Help needed to walk in hospital room?: Total Help needed climbing 3-5 steps with a railing? : Total 6 Click Score: 7    End of Session Equipment Utilized During Treatment: Oxygen (trach collar donned throughout) Activity Tolerance: Patient tolerated treatment well Patient left: in bed;with call bell/phone within reach;with bed alarm set Nurse Communication: Mobility status PT Visit Diagnosis: Unsteadiness on feet (R26.81);Muscle weakness (generalized) (M62.81);Difficulty in walking, not elsewhere classified (R26.2)     Time: 3716-9678 PT Time Calculation (min) (ACUTE ONLY): 22 min  Charges:  $Therapeutic Exercise: 8-22 mins                    Julaine Fusi PTA 02/08/22, 5:40 PM

## 2022-02-09 DIAGNOSIS — I5033 Acute on chronic diastolic (congestive) heart failure: Secondary | ICD-10-CM | POA: Diagnosis not present

## 2022-02-09 LAB — CBC
HCT: 36.8 % — ABNORMAL LOW (ref 39.0–52.0)
Hemoglobin: 9.9 g/dL — ABNORMAL LOW (ref 13.0–17.0)
MCH: 20.9 pg — ABNORMAL LOW (ref 26.0–34.0)
MCHC: 26.9 g/dL — ABNORMAL LOW (ref 30.0–36.0)
MCV: 77.8 fL — ABNORMAL LOW (ref 80.0–100.0)
Platelets: 207 10*3/uL (ref 150–400)
RBC: 4.73 MIL/uL (ref 4.22–5.81)
RDW: 17.8 % — ABNORMAL HIGH (ref 11.5–15.5)
WBC: 7.8 10*3/uL (ref 4.0–10.5)
nRBC: 0 % (ref 0.0–0.2)

## 2022-02-09 LAB — BASIC METABOLIC PANEL
Anion gap: 9 (ref 5–15)
BUN: 36 mg/dL — ABNORMAL HIGH (ref 8–23)
CO2: 29 mmol/L (ref 22–32)
Calcium: 8.8 mg/dL — ABNORMAL LOW (ref 8.9–10.3)
Chloride: 100 mmol/L (ref 98–111)
Creatinine, Ser: 1.36 mg/dL — ABNORMAL HIGH (ref 0.61–1.24)
GFR, Estimated: 58 mL/min — ABNORMAL LOW (ref >60)
Glucose, Bld: 101 mg/dL — ABNORMAL HIGH (ref 70–99)
Potassium: 3.9 mmol/L (ref 3.5–5.1)
Sodium: 138 mmol/L (ref 135–145)

## 2022-02-09 LAB — IRON AND TIBC
Iron: 37 ug/dL — ABNORMAL LOW (ref 45–182)
Saturation Ratios: 11 % — ABNORMAL LOW (ref 17.9–39.5)
TIBC: 332 ug/dL (ref 250–450)
UIBC: 295 ug/dL

## 2022-02-09 LAB — MAGNESIUM: Magnesium: 2.1 mg/dL (ref 1.7–2.4)

## 2022-02-09 LAB — PHOSPHORUS: Phosphorus: 3.9 mg/dL (ref 2.5–4.6)

## 2022-02-09 LAB — VITAMIN B12: Vitamin B-12: 373 pg/mL (ref 180–914)

## 2022-02-09 LAB — VITAMIN D 25 HYDROXY (VIT D DEFICIENCY, FRACTURES): Vit D, 25-Hydroxy: 41.58 ng/mL (ref 30–100)

## 2022-02-09 NOTE — Progress Notes (Signed)
Physical Therapy Treatment Patient Details Name: Gary [name] MRN: [id number] DOB: Apr 06, 1956 Today's Date: 02/09/2022   History of Present Illness Pt is a 66 y.o. 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  Prolonged admittances earlier this year for similar. MD assessment includes: Acute on chronic diastolic CHF, AKI, and acute on chronic respiratory failure with hypoxia.    PT Comments    Pt was long sitting in bed eagerly awaiting PT session. He has remained motivated and cooperative throughout past few days. Encouraged him to continue to perform his daily exercises and remain as motivated as he has been." I will. I want to be able to walk again one day." Pt was able to exit R side of flat bed with less assistance overall. Sat EOB with supervision in prep for standing trials. Pt stood 3 x (from elevated bed height that was slightly lowered each attempt) with +2 assist. He tolerated standing ~ 5-10 sec each time with less assistance required each time. He then requested to attempt taking steps. Chief Strategy Officer discussed safety concerns and recommends pt be able to stand from lower bed height for prolonged periods before attempting. Pt is progressing. Acute PT will continue to follow and progress as able per current POC.    Recommendations for follow up therapy are one component of a multi-disciplinary discharge planning process, led by the attending physician.  Recommendations may be updated based on patient status, additional functional criteria and insurance authorization.  Follow Up Recommendations  Skilled nursing-short term rehab (<3 hours/day)     Assistance Recommended at Discharge Frequent or constant Supervision/Assistance  Patient can return home with the following Two people to help with walking and/or transfers;Two people to help with bathing/dressing/bathroom;Assistance  with cooking/housework;Assist for transportation;Help with stairs or ramp for entrance   Equipment Recommendations  None recommended by PT       Precautions / Restrictions Precautions Precautions: Fall Precaution Comments: cuffed trach/ trach collar Restrictions Weight Bearing Restrictions: No     Mobility  Bed Mobility Overal bed mobility: Needs Assistance Bed Mobility: Supine to Sit, Sit to Supine  Supine to sit: Mod assist Sit to supine: Mod assist   General bed mobility comments: Bed was in flat position. INcreased time required with vcs for pt to try to perform author way versus his usual way. mod assist + increased time. 2nd person for safety.    Transfers Overall transfer level: Needs assistance Equipment used: Rolling walker (2 wheels) (Used green bariatric RW( left in his room) + personal shoes) Transfers: Sit to/from Stand Sit to Stand: +2 safety/equipment, +2 physical assistance, From elevated surface, Min assist, Mod assist      General transfer comment: Pt was able to stand 3 x ~ 5-10 sec from elevated bed height. +2 for safety with RN lowering bed height after pt stood. Less assistance each time with surface height slightly lowered each trial. Pt very proud of himself afterwards. Author encouraged continues practice with all therapist willing to assist him. He did voice wanting to attempt taking steps however author encouraged pt to be able to static stand x 2 minutes with +1 assist before attempting this. Pt is agreeable. Will continue to focus on improving transfers from lower surface heigths going forward.    Ambulation/Gait  General Gait Details: currently unable    Balance Overall balance assessment: Needs assistance Sitting-balance support: Feet supported Sitting balance-[name]  Scale: Good Sitting balance - Comments: pt is able to sit EOB I'ly   Standing balance support: Bilateral upper extremity supported, Reliant on assistive device for balance Standing  balance-[name] Scale: Poor Standing balance comment: limited standing tolerance to less than 10 sec but is demonstrating improving abilities/tolerance       Cognition Arousal/Alertness: Awake/alert Behavior During Therapy: WFL for tasks assessed/performed Overall Cognitive Status: Within Functional Limits for tasks assessed Area of Impairment: Safety/judgement, Awareness      Following Commands: Follows one step commands consistently, Follows multi-step commands inconsistently       General Comments: Pt remains A and O and willing to participate. "Ive been prayig about it."           General Comments General comments (skin integrity, edema, etc.): Continued to encourage pt to perform his exercises throughout the day." I've been doing them and will keep at it."      Pertinent Vitals/Pain Pain Assessment Pain Assessment: No/denies pain Pain Score: 0-No pain Pain Location: no pain reported today Pain Intervention(s): Monitored during session     PT Goals (current goals can now be found in the care plan section) Acute Rehab PT Goals Patient Stated Goal: " I want to be able to walk again" Progress towards PT goals: Progressing toward goals    Frequency    Min 2X/week      PT Plan Current plan remains appropriate       AM-PAC PT "6 Clicks" Mobility   Outcome Measure  Help needed turning from your back to your side while in a flat bed without using bedrails?: A Lot Help needed moving from lying on your back to sitting on the side of a flat bed without using bedrails?: A Lot Help needed moving to and from a bed to a chair (including a wheelchair)?: Total Help needed standing up from a chair using your arms (e.g., wheelchair or bedside chair)?: Total Help needed to walk in hospital room?: Total Help needed climbing 3-5 steps with a railing? : Total 6 Click Score: 8    End of Session Equipment Utilized During Treatment: Other (comment) (Trach collar throughout  without destauration) Activity Tolerance: Patient tolerated treatment well Patient left: in bed;with call bell/phone within reach;with bed alarm set Nurse Communication: Mobility status PT Visit Diagnosis: Unsteadiness on feet (R26.81);Muscle weakness (generalized) (M62.81);Difficulty in walking, not elsewhere classified (R26.2)     Time: 0321-2248 PT Time Calculation (min) (ACUTE ONLY): 24 min  Charges:  $Therapeutic Activity: 23-37 mins                     Julaine Fusi PTA 02/09/22, 4:21 PM

## 2022-02-09 NOTE — Progress Notes (Signed)
[facility] Progress Note  Patient: Gary [name]    EQA:[id number]  DOA: 01/23/2022     Date of Service: the patient was seen and examined on 02/09/2022  Chief Complaint  Patient presents with   Shortness of Breath   Brief hospital course: Taken from H&P.   Gary [name] is a 66 y.o. male with medical history significant for diastolic CHF, chronic respiratory failure status post tracheostomy, COPD, essential hypertension, PUD and sleep apnea who presented to the emergency room with acute onset of worsening dyspnea.  Apparently patient has been in the ER for the past 10 days as he was unable to be cared at home specially requirement of tracheostomy care, awaiting placement. Unfortunately he declined during his ER stay and in spite of being on torsemide developed worsening dyspnea and peripheral edema. Repeat chest x-ray with worsening pulmonary edema.  Also had dry cough and wheezing.  No fever or chills.   Being admitted for management of acute on chronic diastolic heart failure.   ED Course: Vital signs as below.  BMP revealed CO2 34 with a BUN of 38 and creatinine 1.39 compared to 26/1.13 on 10/10 and chloride was 96 compared to 101 then.  BNP was 21.    Chest x-ray showed cardiomegaly with suspected pulmonary edema compared to pulmonary vascular congestion on 10/10.   The patient was given 60 mg of IV Lasix.   10/21: Hemodynamically stable, on 28% FiO2 through trach collar, will be a difficult disposition as he requires vent at night. BMP with mild worsening of creatinine to 1.42, BUN 39, GFR at 55. BNP at 21 is not consistent with acute exacerbation. Holding IV Lasix ,ordered repeat COVID test remain negative. Repeat chest x-ray with stable cardiomegaly and concern of mild interstitial edema.   10/22: AKI resolved after holding Lasix for 1 day, restarting home torsemide.  Respiratory status stable. Awaiting disposition.   10/23: Remained medically stable, still pending  disposition.  Slight worsening of lower extremity edema today, weight seems stable.  Increasing the dose of torsemide from 20 to 40 mg daily.   10/24: Patient medically stable, worsening of creatinine with increased dose of torsemide, holding torsemide for today and we will resume home dose of 20 mg from tomorrow. Patient would like to reconsider the need of trach.  Discussed that he need to lose some weight as that will be helpful if he wants to get rid of this trach.  Still pending disposition    Assessment and Plan:  # Acute on chronic diastolic (congestive) heart failure  Patient to have some peripheral edema and concern of pulmonary vascular congestion on chest x-ray.  BNP is not suggestive of heart failure.   Some worsening of creatinine after increasing torsemide dose to 40. -Held torsemide on 10/25 and resumed at baseline dose of 20 mg daily from 10/26  -Strict intake and output -Daily weight and BMP Cr 1.17--1.51 --1.36 gradually improving   Acute on chronic respiratory failure with hypoxia  - Currently stable on baseline which required intermittent vent support with trach collar 10/26 patient want to remove the tracheostomy tube, interested in the reversal.  Advised to follow with pulmonologist and ENT for further management    AKI (acute kidney injury) -Continue to monitor -Avoid nephrotoxins Creatinine is fluctuating, creatinine 1.5--1.36 today Lasix was discontinued and patient is currently on torsemide 20 mg p.o. daily   Chronic obstructive pulmonary disease (COPD) ([place]) - Continue bronchodilators and home inhalers   GERD without esophagitis -  We will continue PPI therapy.   Obesity, Class III, BMI 40-49.9 (morbid obesity) ([place]) Estimated body mass index is 48.12 kg/m as calculated from the following:   Height as of this encounter: '6\' 3"'$  (1.905 m).   Weight as of this encounter: 174.6 kg.    -This will cause worsening of prognosis   Anxiety and  depression -continue Lexapro and Klonopin as well as Seroquel.   Dyslipidemia, continue statin therapy.        Diet: Heart healthy diet DVT Prophylaxis: Therapeutic Anticoagulation with Eliquis    Advance goals of care discussion: Full code  Family Communication: family was NOT present at bedside, at the time of interview.  The pt provided permission to discuss medical plan with the family. Opportunity was given to ask question and all questions were answered satisfactorily.     Disposition:  Pt is from Home, admitted with Resp Failure and edema, still has edema and resp failure, s/p Trach and using vent at night, which precludes a safe discharge. Discharge to SNF, when bed available. Difficult disposition as he need SNF bed who allow trach vent.  Subjective: No significant events overnight, Patient's pain in the bilateral feet is much better today, denies any worsening of shortness of breath, no chest pain or palpitation, no any other active issues.  Patient is still requesting to talk to pulmonary for the reversal of tracheostomy tube. I sent a text message to Gary [name] pulmonologist   Physical Exam: General:  alert oriented to time, place, and person.  Appear in mild distress, affect appropriate Eyes: PERRLA ENT: Oral Mucosa Clear, moist  Neck: no JVD,  Cardiovascular: S1 and S2 Present, no Murmur,  Respiratory: good respiratory effort, Bilateral Air entry equal and Decreased, mild Crackles, no wheezes Abdomen: Bowel Sound present, Soft and no tenderness,  Skin: no rashes Extremities: mild Pedal edema, no calf tenderness, very dry soles Neurologic: without any new focal findings Gait not checked due to patient safety concerns  Vitals:   02/09/22 1000 02/09/22 1100 02/09/22 1200 02/09/22 1400  BP: 110/74  108/66 112/71  Pulse: 82 82 78 77  Resp: (!) 25 17 (!) 26 16  Temp:      TempSrc:      SpO2: 94% 92% 91% 91%  Weight:      Height:        Intake/Output  Summary (Last 24 hours) at 02/09/2022 1510 Last data filed at 02/09/2022 0200 Gross per 24 hour  Intake --  Output 800 ml  Net -800 ml   Filed Weights   02/06/22 0422 02/08/22 0500 02/09/22 0200  Weight: (!) 174.6 kg (!) 188.7 kg (!) 194 kg    Data Reviewed: I have personally reviewed and interpreted daily labs, tele strips, imagings as discussed above. I reviewed all nursing notes, pharmacy notes, vitals, pertinent old records I have discussed plan of care as described above with RN and patient/family.  CBC: Recent Labs  Lab 02/03/22 0609 02/07/22 0632 02/09/22 0455  WBC 7.1 7.9 7.8  HGB 10.5* 10.0* 9.9*  HCT 38.8* 37.5* 36.8*  MCV 77.6* 78.3* 77.8*  PLT 196 232 224   Basic Metabolic Panel: Recent Labs  Lab 02/03/22 0105 02/03/22 0609 02/04/22 0943 02/07/22 0632 02/09/22 0455  NA 136 139 140 141 138  K 4.2 4.0 3.5 3.7 3.9  CL 96* 97* 101 99 100  CO2 34* 34* 31 33* 29  GLUCOSE 123* 110* 148* 127* 101*  BUN 38* 39* 41* 39* 36*  CREATININE  1.39* 1.42* 1.17 1.51* 1.36*  CALCIUM 8.7* 9.1 9.0 9.1 8.8*  MG  --   --   --   --  2.1  PHOS  --   --   --   --  3.9    Studies: No results found.  Scheduled Meds:  ammonium lactate   Topical BID   apixaban  2.5 mg Oral BID   Chlorhexidine Gluconate Cloth  6 each Topical Daily   diclofenac Sodium  2 g Topical TID   escitalopram  5 mg Oral Daily   ferrous sulfate  325 mg Oral BID WC   gabapentin  400 mg Oral Q8H   Ipratropium-Albuterol  2 puff Inhalation BID   melatonin  5 mg Oral QHS   mometasone-formoterol  2 puff Inhalation BID   mouth rinse  15 mL Mouth Rinse 4 times per day   pantoprazole  40 mg Oral BID AC   potassium chloride  10 mEq Oral Daily   simvastatin  10 mg Oral q1800   torsemide  20 mg Oral Daily   Continuous Infusions: PRN Meds: acetaminophen **OR** acetaminophen, benzonatate, bisacodyl, clonazePAM, ipratropium-albuterol, magnesium hydroxide, ondansetron **OR** ondansetron (ZOFRAN) IV, mouth rinse,  oxyCODONE, polyethylene glycol, QUEtiapine, traZODone  Time spent: 35 minutes  Author: Val Riles. MD [facility] 02/09/2022 3:10 PM  To reach On-call, see care teams to locate the attending and reach out to them via [email or web address]. If 7PM-7AM, please contact night-coverage If you still have difficulty reaching the attending provider, please page the [facility] (Director on Call) for [facility] on amion for assistance.

## 2022-02-10 DIAGNOSIS — I5033 Acute on chronic diastolic (congestive) heart failure: Secondary | ICD-10-CM | POA: Diagnosis not present

## 2022-02-10 LAB — BASIC METABOLIC PANEL
Anion gap: 4 — ABNORMAL LOW (ref 5–15)
BUN: 34 mg/dL — ABNORMAL HIGH (ref 8–23)
CO2: 33 mmol/L — ABNORMAL HIGH (ref 22–32)
Calcium: 8.9 mg/dL (ref 8.9–10.3)
Chloride: 101 mmol/L (ref 98–111)
Creatinine, Ser: 1.35 mg/dL — ABNORMAL HIGH (ref 0.61–1.24)
GFR, Estimated: 59 mL/min — ABNORMAL LOW (ref >60)
Glucose, Bld: 94 mg/dL (ref 70–99)
Potassium: 4 mmol/L (ref 3.5–5.1)
Sodium: 138 mmol/L (ref 135–145)

## 2022-02-10 LAB — CBC
HCT: 37.6 % — ABNORMAL LOW (ref 39.0–52.0)
Hemoglobin: 10.1 g/dL — ABNORMAL LOW (ref 13.0–17.0)
MCH: 20.7 pg — ABNORMAL LOW (ref 26.0–34.0)
MCHC: 26.9 g/dL — ABNORMAL LOW (ref 30.0–36.0)
MCV: 76.9 fL — ABNORMAL LOW (ref 80.0–100.0)
Platelets: 237 10*3/uL (ref 150–400)
RBC: 4.89 MIL/uL (ref 4.22–5.81)
RDW: 17.9 % — ABNORMAL HIGH (ref 11.5–15.5)
WBC: 6.8 10*3/uL (ref 4.0–10.5)
nRBC: 0 % (ref 0.0–0.2)

## 2022-02-10 LAB — MAGNESIUM: Magnesium: 2.1 mg/dL (ref 1.7–2.4)

## 2022-02-10 LAB — PHOSPHORUS: Phosphorus: 3.9 mg/dL (ref 2.5–4.6)

## 2022-02-10 MED ORDER — VITAMIN B-12 1000 MCG PO TABS
500.0000 ug | ORAL_TABLET | Freq: Every day | ORAL | Status: DC
  Administered 2022-02-10 – 2022-04-06 (×48): 500 ug via ORAL
  Filled 2022-02-10 (×50): qty 1

## 2022-02-10 MED ORDER — CHLORHEXIDINE GLUCONATE CLOTH 2 % EX PADS
6.0000 | MEDICATED_PAD | Freq: Every day | CUTANEOUS | Status: DC
  Administered 2022-02-12 – 2022-04-05 (×25): 6 via TOPICAL

## 2022-02-10 MED ORDER — INFLUENZA VAC SPLIT QUAD 0.5 ML IM SUSY
0.5000 mL | PREFILLED_SYRINGE | INTRAMUSCULAR | Status: AC
  Administered 2022-02-11: 0.5 mL via INTRAMUSCULAR
  Filled 2022-02-10: qty 0.5

## 2022-02-10 NOTE — Progress Notes (Signed)
[facility] Progress Note  Patient: Gary [name]    MKL:[id number]  DOA: 01/23/2022     Date of Service: the patient was seen and examined on 02/10/2022  Chief Complaint  Patient presents with   Shortness of Breath   Brief hospital course: Taken from H&P.   Gary [name] is a 66 y.o. male with medical history significant for diastolic CHF, chronic respiratory failure status post tracheostomy, COPD, essential hypertension, PUD and sleep apnea who presented to the emergency room with acute onset of worsening dyspnea.  Apparently patient has been in the ER for the past 10 days as he was unable to be cared at home specially requirement of tracheostomy care, awaiting placement. Unfortunately he declined during his ER stay and in spite of being on torsemide developed worsening dyspnea and peripheral edema. Repeat chest x-ray with worsening pulmonary edema.  Also had dry cough and wheezing.  No fever or chills.   Being admitted for management of acute on chronic diastolic heart failure.   ED Course: Vital signs as below.  BMP revealed CO2 34 with a BUN of 38 and creatinine 1.39 compared to 26/1.13 on 10/10 and chloride was 96 compared to 101 then.  BNP was 21.    Chest x-ray showed cardiomegaly with suspected pulmonary edema compared to pulmonary vascular congestion on 10/10.   The patient was given 60 mg of IV Lasix.   10/21: Hemodynamically stable, on 28% FiO2 through trach collar, will be a difficult disposition as he requires vent at night. BMP with mild worsening of creatinine to 1.42, BUN 39, GFR at 55. BNP at 21 is not consistent with acute exacerbation. Holding IV Lasix ,ordered repeat COVID test remain negative. Repeat chest x-ray with stable cardiomegaly and concern of mild interstitial edema.   10/22: AKI resolved after holding Lasix for 1 day, restarting home torsemide.  Respiratory status stable. Awaiting disposition.   10/23: Remained medically stable, still pending  disposition.  Slight worsening of lower extremity edema today, weight seems stable.  Increasing the dose of torsemide from 20 to 40 mg daily.   10/24: Patient medically stable, worsening of creatinine with increased dose of torsemide, holding torsemide for today and we will resume home dose of 20 mg from tomorrow. Patient would like to reconsider the need of trach.  Discussed that he need to lose some weight as that will be helpful if he wants to get rid of this trach.  Still pending disposition    Assessment and Plan:  # Acute on chronic diastolic (congestive) heart failure  Patient to have some peripheral edema and concern of pulmonary vascular congestion on chest x-ray.  BNP is not suggestive of heart failure.   Some worsening of creatinine after increasing torsemide dose to 40. -Held torsemide on 10/25 and resumed at baseline dose of 20 mg daily from 10/26  -Strict intake and output -Daily weight and BMP Cr 1.17--1.51 --1.35 gradually improving   Acute on chronic respiratory failure with hypoxia  - Currently stable on baseline which required intermittent vent support with trach collar 10/26 patient want to remove the tracheostomy tube, interested in the reversal.  Advised to follow with pulmonologist and ENT for further management    AKI (acute kidney injury) -Continue to monitor -Avoid nephrotoxins Creatinine is fluctuating, creatinine 1.5--1.35  Lasix was discontinued and patient is currently on torsemide 20 mg p.o. daily   Chronic obstructive pulmonary disease (COPD) ([place]) - Continue bronchodilators and home inhalers   GERD without esophagitis -  We will continue PPI therapy.   Obesity, Class III, BMI 40-49.9 (morbid obesity) ([place]) Estimated body mass index is 48.12 kg/m as calculated from the following:   Height as of this encounter: '6\' 3"'$  (1.905 m).   Weight as of this encounter: 174.6 kg.    -This will cause worsening of prognosis   Anxiety and  depression -continue Lexapro and Klonopin as well as Seroquel.   Dyslipidemia, continue statin therapy.        Diet: Heart healthy diet DVT Prophylaxis: Therapeutic Anticoagulation with Eliquis    Advance goals of care discussion: Full code  Family Communication: family was NOT present at bedside, at the time of interview.  The pt provided permission to discuss medical plan with the family. Opportunity was given to ask question and all questions were answered satisfactorily.     Disposition:  Pt is from Home, admitted with Resp Failure and edema, still has edema and resp failure, s/p Trach and using vent at night, which precludes a safe discharge. Discharge to SNF, when bed available. Difficult disposition as he need SNF bed who allow trach vent.  Subjective: No significant events overnight, patient was able to stand up with physical therapy, still having mild pain in the bilateral feet but feels a lot of improvement.  Denies any other active issues.   Physical Exam: General:  alert oriented to time, place, and person.  Appear in mild distress, affect appropriate Eyes: PERRLA ENT: Oral Mucosa Clear, moist  Neck: no JVD,  Cardiovascular: S1 and S2 Present, no Murmur,  Respiratory: good respiratory effort, Bilateral Air entry equal and Decreased, mild Crackles, no wheezes Abdomen: Bowel Sound present, Soft and no tenderness,  Skin: no rashes Extremities: mild Pedal edema, no calf tenderness, very dry soles Neurologic: without any new focal findings Gait not checked due to patient safety concerns  Vitals:   02/10/22 1200 02/10/22 1300 02/10/22 1400 02/10/22 1412  BP: (!) 116/52   105/71  Pulse: 89 79 84 80  Resp: '20 17 20 '$ (!) 21  Temp:    98.5 F (36.9 C)  TempSrc:    Oral  SpO2:  91% 91% (!) 87%  Weight:      Height:        Intake/Output Summary (Last 24 hours) at 02/10/2022 1440 Last data filed at 02/10/2022 1410 Gross per 24 hour  Intake 1420 ml  Output 1875 ml   Net -455 ml   Filed Weights   02/08/22 0500 02/09/22 0200 02/10/22 0200  Weight: (!) 188.7 kg (!) 194 kg (!) 190 kg    Data Reviewed: I have personally reviewed and interpreted daily labs, tele strips, imagings as discussed above. I reviewed all nursing notes, pharmacy notes, vitals, pertinent old records I have discussed plan of care as described above with RN and patient/family.  CBC: Recent Labs  Lab 02/07/22 0632 02/09/22 0455 02/10/22 0801  WBC 7.9 7.8 6.8  HGB 10.0* 9.9* 10.1*  HCT 37.5* 36.8* 37.6*  MCV 78.3* 77.8* 76.9*  PLT 232 207 650   Basic Metabolic Panel: Recent Labs  Lab 02/04/22 0943 02/07/22 0632 02/09/22 0455 02/10/22 0801  NA 140 141 138 138  K 3.5 3.7 3.9 4.0  CL 101 99 100 101  CO2 31 33* 29 33*  GLUCOSE 148* 127* 101* 94  BUN 41* 39* 36* 34*  CREATININE 1.17 1.51* 1.36* 1.35*  CALCIUM 9.0 9.1 8.8* 8.9  MG  --   --  2.1 2.1  PHOS  --   --  3.9 3.9    Studies: No results found.  Scheduled Meds:  ammonium lactate   Topical BID   apixaban  2.5 mg Oral BID   [START ON 02/11/2022] Chlorhexidine Gluconate Cloth  6 each Topical Q0600   vitamin B-12  500 mcg Oral Daily   diclofenac Sodium  2 g Topical TID   escitalopram  5 mg Oral Daily   ferrous sulfate  325 mg Oral BID WC   gabapentin  400 mg Oral Q8H   [START ON 02/11/2022] influenza vac split quadrivalent PF  0.5 mL Intramuscular Tomorrow-1000   Ipratropium-Albuterol  2 puff Inhalation BID   melatonin  5 mg Oral QHS   mometasone-formoterol  2 puff Inhalation BID   mouth rinse  15 mL Mouth Rinse 4 times per day   pantoprazole  40 mg Oral BID AC   potassium chloride  10 mEq Oral Daily   simvastatin  10 mg Oral q1800   torsemide  20 mg Oral Daily   Continuous Infusions: PRN Meds: acetaminophen **OR** acetaminophen, benzonatate, bisacodyl, clonazePAM, ipratropium-albuterol, magnesium hydroxide, ondansetron **OR** ondansetron (ZOFRAN) IV, mouth rinse, oxyCODONE, polyethylene glycol,  QUEtiapine, traZODone  Time spent: 35 minutes  Author: Val Riles. MD [facility] 02/10/2022 2:40 PM  To reach On-call, see care teams to locate the attending and reach out to them via [email or web address]. If 7PM-7AM, please contact night-coverage If you still have difficulty reaching the attending provider, please page the [facility] (Director on Call) for [facility] on amion for assistance.

## 2022-02-10 NOTE — TOC Progression Note (Signed)
Transition of Care [facility]) - Progression Note    Patient Details  Name: Gary [name] MRN: [id number] Date of Birth: 1955/12/05  [facility] CM/SW [place], [place] Phone Number: 02/10/2022, 11:09 AM  Clinical Narrative:     No bed offers at this time.    Expected Discharge Plan: [place] Barriers to Discharge: Continued Medical Work up, Ship broker, Unsafe home situation  Expected Discharge Plan and Services Expected Discharge Plan: [place] In-house Referral: Clinical Social Work     Living arrangements for the past 2 months: Single Family Home                                       Social Determinants of Health (SDOH) Interventions    Readmission Risk Interventions    12/31/2021    8:58 AM 12/20/2021    4:21 PM 05/04/2021    1:26 PM  Readmission Risk Prevention Plan  Transportation Screening Complete Complete Complete  Medication Review Press photographer) Complete Complete Complete  PCP or Specialist appointment within 3-5 days of discharge Complete Complete Complete  HRI or Home Care Consult Complete Complete Complete  SW Recovery Care/Counseling Consult Complete Complete Complete  Palliative Care Screening Complete Complete Not [place] Not Applicable Not Applicable Not Applicable

## 2022-02-11 DIAGNOSIS — I5033 Acute on chronic diastolic (congestive) heart failure: Secondary | ICD-10-CM | POA: Diagnosis not present

## 2022-02-11 MED ORDER — GUAIFENESIN ER 600 MG PO TB12
600.0000 mg | ORAL_TABLET | Freq: Two times a day (BID) | ORAL | Status: DC
  Administered 2022-02-11 – 2022-04-06 (×108): 600 mg via ORAL
  Filled 2022-02-11 (×109): qty 1

## 2022-02-11 MED ORDER — HYDROCOD POLI-CHLORPHE POLI ER 10-8 MG/5ML PO SUER
5.0000 mL | Freq: Two times a day (BID) | ORAL | Status: DC | PRN
  Filled 2022-02-11: qty 5

## 2022-02-11 MED ORDER — HYDROCODONE BIT-HOMATROP MBR 5-1.5 MG/5ML PO SOLN: 5.0000 mL | Freq: Four times a day (QID) | ORAL | Status: DC | PRN

## 2022-02-11 NOTE — Progress Notes (Signed)
[facility] Progress Note  Patient: Gary [name]    TZG:[id number]  DOA: 01/23/2022     Date of Service: the patient was seen and examined on 02/11/2022  Chief Complaint  Patient presents with   Shortness of Breath   Brief hospital course: Taken from H&P.   TUFF CLABO is a 66 y.o. male with medical history significant for diastolic CHF, chronic respiratory failure status post tracheostomy, COPD, essential hypertension, PUD and sleep apnea who presented to the emergency room with acute onset of worsening dyspnea.  Apparently patient has been in the ER for the past 10 days as he was unable to be cared at home specially requirement of tracheostomy care, awaiting placement. Unfortunately he declined during his ER stay and in spite of being on torsemide developed worsening dyspnea and peripheral edema. Repeat chest x-ray with worsening pulmonary edema.  Also had dry cough and wheezing.  No fever or chills.   Being admitted for management of acute on chronic diastolic heart failure.   ED Course: Vital signs as below.  BMP revealed CO2 34 with a BUN of 38 and creatinine 1.39 compared to 26/1.13 on 10/10 and chloride was 96 compared to 101 then.  BNP was 21.    Chest x-ray showed cardiomegaly with suspected pulmonary edema compared to pulmonary vascular congestion on 10/10.   The patient was given 60 mg of IV Lasix.   10/21: Hemodynamically stable, on 28% FiO2 through trach collar, will be a difficult disposition as he requires vent at night. BMP with mild worsening of creatinine to 1.42, BUN 39, GFR at 55. BNP at 21 is not consistent with acute exacerbation. Holding IV Lasix ,ordered repeat COVID test remain negative. Repeat chest x-ray with stable cardiomegaly and concern of mild interstitial edema.   10/22: AKI resolved after holding Lasix for 1 day, restarting home torsemide.  Respiratory status stable. Awaiting disposition.   10/23: Remained medically stable, still pending  disposition.  Slight worsening of lower extremity edema today, weight seems stable.  Increasing the dose of torsemide from 20 to 40 mg daily.   10/24: Patient medically stable, worsening of creatinine with increased dose of torsemide, holding torsemide for today and we will resume home dose of 20 mg from tomorrow. Patient would like to reconsider the need of trach.  Discussed that he need to lose some weight as that will be helpful if he wants to get rid of this trach.  Still pending disposition  10/29 pending disposition, needs SNF who can manage trach vent at night  Assessment and Plan:  # Acute on chronic diastolic (congestive) heart failure  Patient to have some peripheral edema and concern of pulmonary vascular congestion on chest x-ray.  BNP is not suggestive of heart failure.   Some worsening of creatinine after increasing torsemide dose to 40. -Held torsemide on 10/25 and resumed at baseline dose of 20 mg daily from 10/26  -Strict intake and output -Daily weight and BMP Cr 1.17--1.51 --1.35 gradually improving   Acute on chronic respiratory failure with hypoxia  - Currently stable on baseline which required intermittent vent support with trach collar 10/26 patient want to remove the tracheostomy tube, interested in the reversal.  Advised to follow with pulmonologist and ENT for further management    AKI (acute kidney injury) -Continue to monitor -Avoid nephrotoxins Creatinine is fluctuating, creatinine 1.5--1.35  Lasix was discontinued and patient is currently on torsemide 20 mg p.o. daily   Chronic obstructive pulmonary disease (COPD) ([place]) -  Continue bronchodilators and home inhalers   GERD without esophagitis - We will continue PPI therapy.   Obesity, Class III, BMI 40-49.9 (morbid obesity) ([place]) Estimated body mass index is 48.12 kg/m as calculated from the following:   Height as of this encounter: '6\' 3"'$  (1.905 m).   Weight as of this encounter: 174.6 kg.     -This will cause worsening of prognosis   Anxiety and depression -continue Lexapro and Klonopin as well as Seroquel.   Dyslipidemia, continue statin therapy.        Diet: Heart healthy diet DVT Prophylaxis: Therapeutic Anticoagulation with Eliquis    Advance goals of care discussion: Full code  Family Communication: family was NOT present at bedside, at the time of interview.  The pt provided permission to discuss medical plan with the family. Opportunity was given to ask question and all questions were answered satisfactorily.     Disposition:  Pt is from Home, admitted with Resp Failure and edema, still has edema and resp failure, s/p Trach and using vent at night, which precludes a safe discharge. Discharge to SNF, when bed available. Difficult disposition as he need SNF bed who allow trach vent.  Subjective: No significant events overnight, patient was able to stand up with physical therapy, still having mild pain in the bilateral feet but feels a lot of improvement.  Denies any other active issues.   Physical Exam: General:  alert oriented to time, place, and person.  Appear in mild distress, affect appropriate Eyes: PERRLA ENT: Oral Mucosa Clear, moist  Neck: no JVD,  Cardiovascular: S1 and S2 Present, no Murmur,  Respiratory: good respiratory effort, Bilateral Air entry equal and Decreased, mild Crackles, no wheezes, s/p Tracheostomy  Abdomen: Bowel Sound present, Soft and no tenderness,  Skin: no rashes Extremities: mild Pedal edema, no calf tenderness, very dry soles Neurologic: without any new focal findings Gait not checked due to patient safety concerns  Vitals:   02/11/22 1055 02/11/22 1100 02/11/22 1200 02/11/22 1300  BP: 105/69     Pulse: 85 85 84 84  Resp: 18 18 (!) 24 (!) 21  Temp:    98 F (36.7 C)  TempSrc:    Oral  SpO2: 95% 96% 91% 94%  Weight:      Height:        Intake/Output Summary (Last 24 hours) at 02/11/2022 1412 Last data filed at  02/11/2022 1300 Gross per 24 hour  Intake 320 ml  Output 1750 ml  Net -1430 ml   Filed Weights   02/09/22 0200 02/10/22 0200 02/11/22 0245  Weight: (!) 194 kg (!) 190 kg (!) 190 kg    Data Reviewed: I have personally reviewed and interpreted daily labs, tele strips, imagings as discussed above. I reviewed all nursing notes, pharmacy notes, vitals, pertinent old records I have discussed plan of care as described above with RN and patient/family.  CBC: Recent Labs  Lab 02/07/22 0632 02/09/22 0455 02/10/22 0801  WBC 7.9 7.8 6.8  HGB 10.0* 9.9* 10.1*  HCT 37.5* 36.8* 37.6*  MCV 78.3* 77.8* 76.9*  PLT 232 207 160   Basic Metabolic Panel: Recent Labs  Lab 02/07/22 0632 02/09/22 0455 02/10/22 0801  NA 141 138 138  K 3.7 3.9 4.0  CL 99 100 101  CO2 33* 29 33*  GLUCOSE 127* 101* 94  BUN 39* 36* 34*  CREATININE 1.51* 1.36* 1.35*  CALCIUM 9.1 8.8* 8.9  MG  --  2.1 2.1  PHOS  --  3.9 3.9    Studies: No results found.  Scheduled Meds:  ammonium lactate   Topical BID   apixaban  2.5 mg Oral BID   Chlorhexidine Gluconate Cloth  6 each Topical Q0600   vitamin B-12  500 mcg Oral Daily   diclofenac Sodium  2 g Topical TID   escitalopram  5 mg Oral Daily   ferrous sulfate  325 mg Oral BID WC   gabapentin  400 mg Oral Q8H   guaiFENesin  600 mg Oral BID   Ipratropium-Albuterol  2 puff Inhalation BID   melatonin  5 mg Oral QHS   mometasone-formoterol  2 puff Inhalation BID   mouth rinse  15 mL Mouth Rinse 4 times per day   pantoprazole  40 mg Oral BID AC   potassium chloride  10 mEq Oral Daily   simvastatin  10 mg Oral q1800   torsemide  20 mg Oral Daily   Continuous Infusions: PRN Meds: acetaminophen **OR** acetaminophen, benzonatate, bisacodyl, chlorpheniramine-HYDROcodone, clonazePAM, ipratropium-albuterol, magnesium hydroxide, ondansetron **OR** ondansetron (ZOFRAN) IV, mouth rinse, oxyCODONE, polyethylene glycol, QUEtiapine, traZODone  Time spent: 35  minutes  Author: Val Riles. MD [facility] 02/11/2022 2:12 PM  To reach On-call, see care teams to locate the attending and reach out to them via [email or web address]. If 7PM-7AM, please contact night-coverage If you still have difficulty reaching the attending provider, please page the [facility] (Director on Call) for [facility] on amion for assistance.
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
## 2022-02-12 DIAGNOSIS — I5033 Acute on chronic diastolic (congestive) heart failure: Secondary | ICD-10-CM | POA: Diagnosis not present

## 2022-02-12 LAB — BASIC METABOLIC PANEL
Anion gap: 6 (ref 5–15)
BUN: 37 mg/dL — ABNORMAL HIGH (ref 8–23)
CO2: 32 mmol/L (ref 22–32)
Calcium: 8.8 mg/dL — ABNORMAL LOW (ref 8.9–10.3)
Chloride: 100 mmol/L (ref 98–111)
Creatinine, Ser: 1.18 mg/dL (ref 0.61–1.24)
GFR, Estimated: >60 mL/min (ref >60)
Glucose, Bld: 131 mg/dL — ABNORMAL HIGH (ref 70–99)
Potassium: 4.2 mmol/L (ref 3.5–5.1)
Sodium: 138 mmol/L (ref 135–145)

## 2022-02-12 LAB — CBC
HCT: 37.4 % — ABNORMAL LOW (ref 39.0–52.0)
Hemoglobin: 10.1 g/dL — ABNORMAL LOW (ref 13.0–17.0)
MCH: 21.3 pg — ABNORMAL LOW (ref 26.0–34.0)
MCHC: 27 g/dL — ABNORMAL LOW (ref 30.0–36.0)
MCV: 78.9 fL — ABNORMAL LOW (ref 80.0–100.0)
Platelets: 230 10*3/uL (ref 150–400)
RBC: 4.74 MIL/uL (ref 4.22–5.81)
RDW: 17.8 % — ABNORMAL HIGH (ref 11.5–15.5)
WBC: 7.2 10*3/uL (ref 4.0–10.5)
nRBC: 0 % (ref 0.0–0.2)

## 2022-02-12 LAB — PHOSPHORUS: Phosphorus: 3.1 mg/dL (ref 2.5–4.6)

## 2022-02-12 LAB — MAGNESIUM: Magnesium: 1.9 mg/dL (ref 1.7–2.4)

## 2022-02-12 NOTE — Progress Notes (Signed)
Physical Therapy Treatment Patient Details Name: Gary [name] MRN: [id number] DOB: [birth year] Today's Date: 02/12/2022   History of Present Illness Pt is a 66 y.o. 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  Prolonged admittances earlier this year for similar. MD assessment includes: Acute on chronic diastolic CHF, AKI, and acute on chronic respiratory failure with hypoxia.    PT Comments    Patient is motivated to participate with therapy today. He reports performing home exercise program throughout the day using therabands. He continues to require assistance for bed mobility. He was able to stand x 3 bouts with +2 person assistance with short rest breaks between bouts of standing. Standing tolerance of around 20 seconds. Patient unable to take a side step today due to generalize weakness and limited standing activity tolerance. Recommend to continue PT to maximize independence and decrease caregiver burden.    Recommendations for follow up therapy are one component of a multi-disciplinary discharge planning process, led by the attending physician.  Recommendations may be updated based on patient status, additional functional criteria and insurance authorization.  Follow Up Recommendations  Skilled nursing-short term rehab (<3 hours/day) Can patient physically be transported by private vehicle: No   Assistance Recommended at Discharge Frequent or constant Supervision/Assistance  Patient can return home with the following Two people to help with walking and/or transfers;Two people to help with bathing/dressing/bathroom;Assistance with cooking/housework;Assist for transportation;Help with stairs or ramp for entrance   Equipment Recommendations  None recommended by PT    Recommendations for Other Services       Precautions / Restrictions Precautions Precautions:  Fall Restrictions Weight Bearing Restrictions: No     Mobility  Bed Mobility Overal bed mobility: Needs Assistance Bed Mobility: Supine to Sit, Sit to Supine     Supine to sit: Max assist Sit to supine: Mod assist   General bed mobility comments: assistance for LE and trunk elevation to sit upright. assistance for BLE to return to bed. once back to bed, patient able to assist with pulling himself up in bed using bed rail a head of bed and bed in trendelenburg    Transfers Overall transfer level: Needs assistance Equipment used: Rolling walker (2 wheels) (bariatric walker (green)) Transfers: Sit to/from Stand Sit to Stand: Mod assist, +2 physical assistance, From elevated surface           General transfer comment: lifting assistance required for standing. verbal cues for rocking technique to use momentum to stand. bed height elevated per patient request with education provided on not raising bed too height due to fall risk with transfer efforts. 3 bouts of standing performed with seated rest break between bouts of standing    Ambulation/Gait               General Gait Details: encouraged patient to try a lateral side step along edge of bed with demonstration provided, however patient reports he is unable to due to fatigue and generalized weakness   Stairs             Wheelchair Mobility    Modified Rankin (Stroke Patients Only)       Balance Overall balance assessment: Needs assistance Sitting-balance support: Feet supported Sitting balance-[name] Scale: Good Sitting balance - Comments: no loss of balance in sitting position   Standing balance support: Bilateral upper extremity supported, Reliant on assistive device for balance Standing balance-[name]  Scale: Poor Standing balance comment: standing tolerance of ~ 20 seconds. external support required with heavy reliance on rolling walker for support                            Cognition  Arousal/Alertness: Awake/alert Behavior During Therapy: WFL for tasks assessed/performed Overall Cognitive Status: Within Functional Limits for tasks assessed Area of Impairment: Safety/judgement, Awareness                       Following Commands: Follows one step commands consistently, Follows multi-step commands inconsistently Safety/Judgement: Decreased awareness of safety, Decreased awareness of deficits   Problem Solving: Requires verbal cues          Exercises      General Comments        Pertinent Vitals/Pain Pain Assessment Pain Assessment: No/denies pain    Home Living                          Prior Function            PT Goals (current goals can now be found in the care plan section) Acute Rehab PT Goals Patient Stated Goal: to be able to walk PT Goal Formulation: With patient Time For Goal Achievement: 02/26/22 (care plan extended and continue with current goals) Potential to Achieve Goals: Fair Progress towards PT goals: Progressing toward goals    Frequency    Min 2X/week      PT Plan Current plan remains appropriate    Co-evaluation              AM-PAC PT "6 Clicks" Mobility   Outcome Measure  Help needed turning from your back to your side while in a flat bed without using bedrails?: A Lot Help needed moving from lying on your back to sitting on the side of a flat bed without using bedrails?: A Lot Help needed moving to and from a bed to a chair (including a wheelchair)?: Total Help needed standing up from a chair using your arms (e.g., wheelchair or bedside chair)?: Total Help needed to walk in hospital room?: Total Help needed climbing 3-5 steps with a railing? : Total 6 Click Score: 8    End of Session Equipment Utilized During Treatment:  (trach collar throughout with PMSV placed by the patient prior to mobilizing) Activity Tolerance: Patient tolerated treatment well Patient left: in bed;with call  bell/phone within reach;with bed alarm set Nurse Communication: Mobility status PT Visit Diagnosis: Unsteadiness on feet (R26.81);Muscle weakness (generalized) (M62.81);Difficulty in walking, not elsewhere classified (R26.2)     Time: 3299-2426 PT Time Calculation (min) (ACUTE ONLY): 23 min  Charges:  $Therapeutic Activity: 23-37 mins                     Gary [name], PT, MPT    Gary [name] 02/12/2022, 2:10 PM

## 2022-02-12 NOTE — Progress Notes (Signed)
PROGRESS NOTE    Gary [name]  ZYS:[id number] DOB: 1956-01-06 DOA: 01/23/2022 PCP: [facility] [place]  [id number]  LOS: 9 days   Brief hospital course: Gary [name] is a 66 y.o. male with medical history significant for diastolic CHF, chronic respiratory failure status post tracheostomy, COPD, essential hypertension, PUD and sleep apnea who presented to the emergency room with acute onset of worsening dyspnea.    Apparently patient has been in the ER for the past 10 days as he was unable to be cared at home specially requirement of tracheostomy care, awaiting placement. Unfortunately he declined during his ER stay and in spite of being on torsemide developed worsening dyspnea and peripheral edema.  Repeat chest x-ray with worsening pulmonary edema.  Pt was admitted for management of acute on chronic diastolic heart failure.  Assessment & Plan:  # Acute on chronic diastolic (congestive) heart failure  Patient to have some peripheral edema and concern of pulmonary vascular congestion on chest x-ray.  BNP was actually wnl. Some worsening of creatinine after increasing torsemide dose to 40. -Held torsemide on 10/25 and resumed at baseline dose of 20 mg daily from 10/26  --cont torsemide   Acute on chronic respiratory failure with hypoxia  - Currently stable on baseline which required intermittent vent support with trach collar 10/26 patient want to remove the tracheostomy tube, interested in the reversal.   --pulm consult pending   AKI (acute kidney injury) --Cr peaked at 1.51, baseline around 1.2. --monitor for now   Chronic obstructive pulmonary disease (COPD) ([place]) - Continue bronchodilators and home inhalers   GERD without esophagitis - cont PPI   Obesity, Class III, BMI 40-49.9 (morbid obesity) ([place]) Estimated body mass index is 48.12 kg/m as calculated from the following:   Height as of this encounter: '6\' 3"'$  (1.905 m).   Weight as of this encounter:  174.6 kg.    Anxiety and depression -continue Lexapro and Klonopin as well as Seroquel.   Dyslipidemia --continue statin therapy.   Microcytic iron def anemia --cont iron supplement   DVT prophylaxis: XN:[id number] Code Status: Full code  Family Communication:  Level of care: Stepdown Dispo:   The patient is from: home Anticipated d/c is to: SNF Anticipated d/c date is: whenever bed available   Subjective and Interval History:  Pt reported normal oral intake.  Asking for his trach to be taken out.   Objective: Vitals:   02/12/22 0400 02/12/22 0700 02/12/22 0900 02/12/22 1200  BP: 104/71  (!) 95/48 110/70  Pulse: 70  77 77  Resp: 20  20 (!) 21  Temp: 98.1 F (36.7 C)  98 F (36.7 C) 98.5 F (36.9 C)  TempSrc: Oral  Oral Oral  SpO2: 95% 97% 97% 93%  Weight:      Height:        Intake/Output Summary (Last 24 hours) at 02/12/2022 1740 Last data filed at 02/12/2022 1457 Gross per 24 hour  Intake 240 ml  Output 1350 ml  Net -1110 ml   Filed Weights   02/09/22 0200 02/10/22 0200 02/11/22 0245  Weight: (!) 194 kg (!) 190 kg (!) 190 kg    Examination:   Constitutional: NAD, AAOx3 HEENT: conjunctivae and lids normal, EOMI CV: No cyanosis.   RESP: trach collar with 5L O2 blow by. Extremities: some non-pitting edema in BLE SKIN: warm, dry Neuro: II - XII grossly intact.   Psych: Normal mood and affect.  Appropriate judgement and reason   Data Reviewed:  I have personally reviewed labs and imaging studies  Time spent: 50 minutes  Gary Bi, MD [facility] If 7PM-7AM, please contact night-coverage 02/12/2022, 5:40 PM

## 2022-02-13 DIAGNOSIS — I5033 Acute on chronic diastolic (congestive) heart failure: Secondary | ICD-10-CM | POA: Diagnosis not present

## 2022-02-13 MED ORDER — COVID-19 MRNA 2023-2024 VACCINE (COMIRNATY) 0.3 ML INJECTION
0.3000 mL | Freq: Once | INTRAMUSCULAR | Status: AC
  Administered 2022-02-13: 0.3 mL via INTRAMUSCULAR
  Filled 2022-02-13: qty 0.3

## 2022-02-13 NOTE — Progress Notes (Signed)
Physical Therapy Treatment Patient Details Name: Gary [name] MRN: [id number] DOB: [birth year] Today's Date: 02/13/2022   History of Present Illness Pt is a 66 y.o. 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  Prolonged admittances earlier this year for similar. MD assessment includes: Acute on chronic diastolic CHF, AKI, and acute on chronic respiratory failure with hypoxia.   PT Comments    Patient is cooperative and agreeable to PT today. He wanted to focus on LE exercises for strengthening which were performed for increased independence with functional activity, bed mobility, standing, etc. Good effort with all exercises with occasional assistance provided mostly for the right leg. Intermittent increased respiration rate with exercises with otherwise no significant change in vitals and no pain reported. Patient wants to attempt standing again during the next PT session and is requesting for therapy to return again tomorrow. PT will continue to follow.    Recommendations for follow up therapy are one component of a multi-disciplinary discharge planning process, led by the attending physician.  Recommendations may be updated based on patient status, additional functional criteria and insurance authorization.  Follow Up Recommendations  Skilled nursing-short term rehab (<3 hours/day) Can patient physically be transported by private vehicle: No   Assistance Recommended at Discharge Frequent or constant Supervision/Assistance  Patient can return home with the following Two people to help with walking and/or transfers;Two people to help with bathing/dressing/bathroom;Assistance with cooking/housework;Assist for transportation;Help with stairs or ramp for entrance   Equipment Recommendations  None recommended by PT    Recommendations for Other Services       Precautions /  Restrictions Precautions Precautions: Fall Restrictions Weight Bearing Restrictions: No     Mobility  Bed Mobility               General bed mobility comments: patient declined OOB activity today. He wanted to focus on LE exercises for strengthening    Transfers                        Ambulation/Gait                   Stairs             Wheelchair Mobility    Modified Rankin (Stroke Patients Only)       Balance                                            Cognition Arousal/Alertness: Awake/alert Behavior During Therapy: WFL for tasks assessed/performed Overall Cognitive Status: Within Functional Limits for tasks assessed Area of Impairment: Safety/judgement, Awareness                       Following Commands: Follows one step commands consistently, Follows multi-step commands inconsistently Safety/Judgement: Decreased awareness of safety, Decreased awareness of deficits   Problem Solving: Requires verbal cues          Exercises General Exercises - Lower Extremity Ankle Circles/Pumps: AROM, Strengthening, Both, 20 reps, Supine Gluteal Sets: AROM, Strengthening, Both, 15 reps, Supine Heel Slides: AAROM, AROM, Strengthening, Both, 20 reps (2 sets of 10 reps each. AAROM with the frist set of 10 on RLE and then AROM after for all others.) Hip ABduction/ADduction: AAROM,  Strengthening, AROM, Both, 15 reps, Supine (AAROM for RLE) Straight Leg Raises: AAROM, Strengthening, Both, 10 reps, Supine Other Exercises Other Exercises: isometric adduction with pillow squeeze x 15 reps BLE; tactile and verbal cues for exercise technique for strengthening. intermittent short rest breaks required between bouts of exercises with mildly elevated respiration rate. otherwise, no significant change in vitals noted with exercises    General Comments        Pertinent Vitals/Pain Pain Assessment Pain Assessment: No/denies pain     Home Living                          Prior Function            PT Goals (current goals can now be found in the care plan section) Acute Rehab PT Goals Patient Stated Goal: to be able to walk PT Goal Formulation: With patient Time For Goal Achievement: 02/26/22 Potential to Achieve Goals: Fair Progress towards PT goals: Progressing toward goals    Frequency    Min 2X/week      PT Plan Current plan remains appropriate    Co-evaluation              AM-PAC PT "6 Clicks" Mobility   Outcome Measure  Help needed turning from your back to your side while in a flat bed without using bedrails?: A Lot Help needed moving from lying on your back to sitting on the side of a flat bed without using bedrails?: A Lot Help needed moving to and from a bed to a chair (including a wheelchair)?: Total Help needed standing up from a chair using your arms (e.g., wheelchair or bedside chair)?: Total Help needed to walk in hospital room?: Total Help needed climbing 3-5 steps with a railing? : Total 6 Click Score: 8    End of Session Equipment Utilized During Treatment:  (PMSV in place) Activity Tolerance: Patient tolerated treatment well Patient left: in bed;with call bell/phone within reach Nurse Communication: Mobility status PT Visit Diagnosis: Unsteadiness on feet (R26.81);Muscle weakness (generalized) (M62.81);Difficulty in walking, not elsewhere classified (R26.2)     Time: 9417-4081 PT Time Calculation (min) (ACUTE ONLY): 17 min  Charges:  $Therapeutic Exercise: 8-22 mins                    [name], PT, MPT    [name] 02/13/2022, 2:17 PM

## 2022-02-13 NOTE — Progress Notes (Addendum)
PROGRESS NOTE    DQUAN [name]  OFB:[id number] DOB: 16-Jan-1956 DOA: 01/23/2022 [facility]CP: [name]  [id number]/[id number]  LOS: 10 days   Brief hospital course: Gary [name] is a 66 y.o. male with medical history significant for diastolic CHF, chronic respiratory failure status post tracheostomy, COPD, essential hypertension, PUD and sleep apnea who presented to the emergency room with acute onset of worsening dyspnea.    Apparently patient had been in the ER for 10 days as he was unable to be cared at home specially requirement of tracheostomy care, awaiting placement. Unfortunately he declined during his ER stay and in spite of being on torsemide developed worsening dyspnea and peripheral edema.  Repeat chest x-ray with worsening pulmonary edema.  Pt was admitted for management of acute on chronic diastolic heart failure.  Assessment & Plan:  # Acute on chronic diastolic (congestive) heart failure  Patient to have some peripheral edema and concern of pulmonary vascular congestion on chest x-ray.  BNP was actually wnl. Some worsening of creatinine after increasing torsemide dose to 40. -Held torsemide on 10/25 and resumed at baseline dose of 20 mg daily from 10/26  --cont torsemide   Acute on chronic respiratory failure with hypoxia  - Currently stable.  At baseline requires intermittent vent support with trach collar.  --Pt wants to remove trach, however, per pulm Dr. Lanney Gins, "pt continues to need ventilator and has no family that is willing to help him. If we take it out, there may be tracheomalacia from scarring and next time he comes to be intubated it will be complicated he may code and die. So for now he needs to get better and get out of hospital then we can assess once in chronic stable state" as outpatient.  AKI (acute kidney injury) --Cr peaked at 1.51, baseline around 1.2.  Back to baseline.   Chronic obstructive pulmonary disease (COPD) ([name]) - Continue  bronchodilators and home inhalers   GERD without esophagitis - cont PPI   Obesity, Class III, BMI 40-49.9 (morbid obesity) ([place]) Estimated body mass index is 48.12 kg/m as calculated from the following:   Height as of this encounter: '6\' 3"'$  (1.905 m).   Weight as of this encounter: 174.6 kg.    Anxiety and depression -continue Lexapro and Klonopin as well as Seroquel.   Dyslipidemia --continue statin therapy.   Microcytic iron def anemia --cont iron supplement   DVT prophylaxis: PO:[id number] Code Status: Full code  Family Communication:  Level of care: Stepdown Dispo:   The patient is from: home Anticipated d/c is to: SNF Anticipated d/c date is: whenever bed available   Subjective and Interval History:  No change today.   Objective: Vitals:   02/13/22 1000 02/13/22 1230 02/13/22 1400 02/13/22 1600  BP:  133/84  120/73  Pulse: 87 81 89   Resp: (!) 28 (!) 27 (!) 27 (!) 22  Temp:      TempSrc:      SpO2: (!) 88% 90% 93%   Weight:      Height:        Intake/Output Summary (Last 24 hours) at 02/13/2022 1638 Last data filed at 02/13/2022 1445 Gross per 24 hour  Intake 330 ml  Output 2775 ml  Net -2445 ml   Filed Weights   02/09/22 0200 02/10/22 0200 02/11/22 0245  Weight: (!) 194 kg (!) 190 kg (!) 190 kg    Examination:   Constitutional: NAD CV: No cyanosis.   RESP: trach collar with  5L O2 blowby Extremities: some non-pitting edema in BLE   Data Reviewed: I have personally reviewed labs and imaging studies  Time spent: 25 minutes  Enzo Bi, MD [facility] If 7PM-7AM, please contact night-coverage 02/13/2022, 4:38 PM

## 2022-02-14 DIAGNOSIS — I5033 Acute on chronic diastolic (congestive) heart failure: Secondary | ICD-10-CM | POA: Diagnosis not present

## 2022-02-14 LAB — CBC
HCT: 37.5 % — ABNORMAL LOW (ref 39.0–52.0)
Hemoglobin: 10.3 g/dL — ABNORMAL LOW (ref 13.0–17.0)
MCH: 21.7 pg — ABNORMAL LOW (ref 26.0–34.0)
MCHC: 27.5 g/dL — ABNORMAL LOW (ref 30.0–36.0)
MCV: 78.9 fL — ABNORMAL LOW (ref 80.0–100.0)
Platelets: 225 10*3/uL (ref 150–400)
RBC: 4.75 MIL/uL (ref 4.22–5.81)
RDW: 17.8 % — ABNORMAL HIGH (ref 11.5–15.5)
WBC: 7.8 10*3/uL (ref 4.0–10.5)
nRBC: 0 % (ref 0.0–0.2)

## 2022-02-14 LAB — BASIC METABOLIC PANEL
Anion gap: 5 (ref 5–15)
BUN: 34 mg/dL — ABNORMAL HIGH (ref 8–23)
CO2: 31 mmol/L (ref 22–32)
Calcium: 8.7 mg/dL — ABNORMAL LOW (ref 8.9–10.3)
Chloride: 102 mmol/L (ref 98–111)
Creatinine, Ser: 1.25 mg/dL — ABNORMAL HIGH (ref 0.61–1.24)
GFR, Estimated: >60 mL/min (ref >60)
Glucose, Bld: 113 mg/dL — ABNORMAL HIGH (ref 70–99)
Potassium: 4.6 mmol/L (ref 3.5–5.1)
Sodium: 138 mmol/L (ref 135–145)

## 2022-02-14 LAB — MAGNESIUM: Magnesium: 2.1 mg/dL (ref 1.7–2.4)
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
## 2022-02-14 NOTE — Progress Notes (Signed)
[facility] NAME: Gary [name]    MR#:  [id number]  DATE OF BIRTH:  1955-08-14  SUBJECTIVE:  patient is asking me and requesting to consider removing his trach two. Tells me gets clogged up and then at home he used to try to unclog it and had issues with bleeding. He is also having some trouble with his voice although during my evaluation he was speaking clearly. No family at bedside. No respiratory distress. Working with therapy slowly.    VITALS:  Blood pressure 122/76, pulse 81, temperature 98 F (36.7 C), temperature source Oral, resp. rate (!) 22, height '6\' 3"'$  (1.905 m), weight (!) 195 kg, SpO2 96 %.  PHYSICAL EXAMINATION:   GENERAL:  66 y.o.-year-old patient lying in the bed with no acute distress. Morbid obesity Trach collar + LUNGS: decreased  breath sounds bilaterally, no wheezing CARDIOVASCULAR: S1, S2 normal. No murmurs,   ABDOMEN: Soft, nontender, nondistended. Bowel sounds present.  EXTREMITIES: ++ edema b/l.    NEUROLOGIC: nonfocal  patient is alert and awake SKIN: No obvious rash, lesion, or ulcer.   LABORATORY PANEL:  CBC Recent Labs  Lab 02/14/22 1454  WBC 7.8  HGB 10.3*  HCT 37.5*  PLT 225    Chemistries  Recent Labs  Lab 02/14/22 1454  NA 138  K 4.6  CL 102  CO2 31  GLUCOSE 113*  BUN 34*  CREATININE 1.25*  CALCIUM 8.7*  MG 2.1     Gary [name] is a 66 y.o. male with medical history significant for diastolic CHF, chronic respiratory failure status post tracheostomy, COPD, essential hypertension, PUD and sleep apnea who presented to the emergency room with acute onset of worsening dyspnea.     Apparently patient had been in the ER for 10 days as he was unable to be cared at home specially requirement of tracheostomy care, awaiting placement. Unfortunately he declined during his ER stay and in spite of being on torsemide developed worsening dyspnea and peripheral edema.  Repeat chest x-ray  with worsening pulmonary edema.  Pt was admitted for management of acute on chronic diastolic heart failure.   Assessment & Plan:  Acute on chronic diastolic (congestive) heart failure  Patient to have some peripheral edema and concern of pulmonary vascular congestion on chest x-ray.  BNP was actually wnl. Some worsening of creatinine after increasing torsemide dose to 40. --cont torsemide   Acute on chronic respiratory failure with hypoxia  - Currently stable.  At baseline requires intermittent vent support with trach collar at night time--pt using his own machine --Pt wants to remove trach, however, per pulm Dr. Lanney Gins, "pt continues to need ventilator and has no family that is willing to help him. If we take it out, there may be tracheomalacia from scarring and next time he comes to be intubated it will be complicated he may code and die. So for now he needs to get better and get out of hospital then we can assess once in chronic stable state" as outpatient.   AKI (acute kidney injury) --Cr peaked at 1.51, baseline around 1.2.  Back to baseline.   Chronic obstructive pulmonary disease (COPD) ([name]) - Continue bronchodilators and home inhalers   GERD without esophagitis - cont PPI   Obesity, Class III, BMI 40-49.9 (morbid obesity) ([place]) Estimated body mass index is 48.12 kg/m as calculated from the following:   Height as of this encounter: '6\' 3"'$  (1.905 m).   Weight  as of this encounter: 174.6 kg.    Anxiety and depression -continue Lexapro and Klonopin as well as Seroquel.   Dyslipidemia --continue statin therapy.   Microcytic iron def anemia --cont iron supplement     DVT prophylaxis: VK:[id number] Code Status: Full code  Family Communication:  Level of care: Stepdown Dispo:   The patient is from: home Anticipated d/c is to: SNF Anticipated d/c date is: whenever bed available         TOTAL TIME TAKING CARE OF THIS PATIENT: 35 minutes.  >50% time spent on  counselling and coordination of care  Note: This dictation was prepared with Dragon dictation along with smaller phrase technology. Any transcriptional errors that result from this process are unintentional.  Fritzi Mandes M.D    [facility]   CC: Primary care physician; Center, [facility]

## 2022-02-14 NOTE — Progress Notes (Signed)
Physical Therapy Treatment Patient Details Name: Gary [name] MRN: [id number] DOB: 1955/11/29 Today's Date: 02/14/2022   History of Present Illness Pt is a 66 y.o. 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  Prolonged admittances earlier this year for similar. MD assessment includes: Acute on chronic diastolic CHF, AKI, and acute on chronic respiratory failure with hypoxia.    PT Comments    Pt was long sitting in bed upon arriving. He greets Chief Strategy Officer and is agreeable to session. Due to being later in the day, he requested to focus on strengthening versus transfers." Tomorrow I promise I'll work on standing and taking steps. I want to be able to walk by thanksgiving." See all exercises performed listed below. Does continue to present with improving strength and abilities overall. Will return tomorrow at 2 pm to assist pt with OOB activity. SNF most appropriate at DC  Recommendations for follow up therapy are one component of a multi-disciplinary discharge planning process, led by the attending physician.  Recommendations may be updated based on patient status, additional functional criteria and insurance authorization.  Follow Up Recommendations  Skilled nursing-short term rehab (<3 hours/day)     Assistance Recommended at Discharge Frequent or constant Supervision/Assistance  Patient can return home with the following Two people to help with walking and/or transfers;Two people to help with bathing/dressing/bathroom;Assistance with cooking/housework;Assist for transportation;Help with stairs or ramp for entrance   Equipment Recommendations  None recommended by PT       Precautions / Restrictions Precautions Precautions: Fall Precaution Comments: cuffed trach/ trach collar Restrictions Weight Bearing Restrictions: No     Mobility  Bed Mobility Overal bed mobility:  Needs Assistance Bed Mobility: Supine to Sit, Sit to Supine     Supine to sit: Max assist Sit to supine: Max assist   General bed mobility comments: pt requested to strictly focused on strengthening    Transfers  General transfer comment: " I'll work on standing and taking steps next session."    Ambulation/Gait  General Gait Details: Current unable   Balance Overall balance assessment: Needs assistance Sitting-balance support: Feet supported Sitting balance-[name] Scale: Good     Standing balance support: Bilateral upper extremity supported, Reliant on assistive device for balance Standing balance-[name] Scale: Poor      Cognition Arousal/Alertness: Awake/alert Behavior During Therapy: WFL for tasks assessed/performed Overall Cognitive Status: Within Functional Limits for tasks assessed Area of Impairment: Safety/judgement, Awareness    Following Commands: Follows one step commands consistently, Follows multi-step commands inconsistently Safety/Judgement: Decreased awareness of safety, Decreased awareness of deficits   Problem Solving: Requires verbal cues General Comments: pt continues to be overall motivated and cooperative. requested to work strickly on strengthening this session.        Exercises Total Joint Exercises Ankle Circles/Pumps: AROM, 10 reps Quad Sets: AROM, 10 reps Gluteal Sets: 10 reps Short Arc Quad: AROM, 10 reps, Left, AAROM, Right, 20 reps Heel Slides: AROM, Left, 10 reps, AAROM Hip ABduction/ADduction: AROM, 10 reps Straight Leg Raises: AAROM, 10 reps, Both    General Comments General comments (skin integrity, edema, etc.): pt was able to tolerate and perform reverse trendelenburg squts, SLS squats, SLR (assisted on RLE), heel slides. quat sets and LAQ. tolerated all exercises well. " I'll ready to stand and take steps tomorrow."      Pertinent Vitals/Pain Pain Assessment Pain Assessment: No/denies pain Pain Score: 0-No pain  PT  Goals (current goals can now be found in the care plan section) Acute Rehab PT Goals Patient Stated Goal: to be able to walk Progress towards PT goals: Progressing toward goals    Frequency    Min 2X/week      PT Plan Current plan remains appropriate       AM-PAC PT "6 Clicks" Mobility   Outcome Measure  Help needed turning from your back to your side while in a flat bed without using bedrails?: A Lot Help needed moving from lying on your back to sitting on the side of a flat bed without using bedrails?: A Lot Help needed moving to and from a bed to a chair (including a wheelchair)?: Total Help needed standing up from a chair using your arms (e.g., wheelchair or bedside chair)?: Total Help needed to walk in hospital room?: Total Help needed climbing 3-5 steps with a railing? : Total 6 Click Score: 8    End of Session Equipment Utilized During Treatment:  (trach collar throughout with vitals stable) Activity Tolerance: Patient tolerated treatment well Patient left: in bed;with call bell/phone within reach Nurse Communication: Mobility status PT Visit Diagnosis: Unsteadiness on feet (R26.81);Muscle weakness (generalized) (M62.81);Difficulty in walking, not elsewhere classified (R26.2)     Time: 3704-8889 PT Time Calculation (min) (ACUTE ONLY): 15 min  Charges:  $Therapeutic Exercise: 8-22 mins                     Julaine Fusi PTA 02/14/22, 4:56 PM

## 2022-02-15 DIAGNOSIS — I5033 Acute on chronic diastolic (congestive) heart failure: Secondary | ICD-10-CM | POA: Diagnosis not present

## 2022-02-15 NOTE — Progress Notes (Signed)
Physical Therapy Treatment Patient Details Name: Gary [name] MRN: [id number] DOB: 05/18/1955 Today's Date: 02/15/2022   History of Present Illness Pt is a 66 y.o. 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  Prolonged admittances earlier this year for similar. MD assessment includes: Acute on chronic diastolic CHF, AKI, and acute on chronic respiratory failure with hypoxia.    PT Comments    Pt was long sitting in bed upon arriving. Pt was in negative mindset form the start which carried throughout session. He remains A and O x 4 and cooperative but length discussion/education about his negative attitude impacting his performance with PT. He states understanding but remained limited. He was able to stand 2 x EOB (elevated) > bariatric RW. He performed transfers better previous few session than he did this one. Pt blames flu shot/Covid vaccine. Author re-educated pt on fear of falling slowing his progression with therapy. Eventually agree that he is fearful of falling. Continued to encourage pt to keep doing his HEP/exercises when PT is not present. He will continue to benefit from skilled PT to maximize independence with all ADLs.    Recommendations for follow up therapy are one component of a multi-disciplinary discharge planning process, led by the attending physician.  Recommendations may be updated based on patient status, additional functional criteria and insurance authorization.  Follow Up Recommendations  Skilled nursing-short term rehab (<3 hours/day)     Assistance Recommended at Discharge Frequent or constant Supervision/Assistance  Patient can return home with the following Two people to help with walking and/or transfers;Two people to help with bathing/dressing/bathroom;Assistance with cooking/housework;Assist for transportation;Help with stairs or ramp for  entrance   Equipment Recommendations  None recommended by PT       Precautions / Restrictions Precautions Precautions: Fall Precaution Comments: cuffed trach/ trach collar Restrictions Weight Bearing Restrictions: No     Mobility  Bed Mobility Overal bed mobility: Needs Assistance Bed Mobility: Supine to Sit, Sit to Supine  Supine to sit: Max assist Sit to supine: Max assist   General bed mobility comments: Max assist to exit bed and max assist to return. +2 assistance for safety however pt can perform with +1 assist only.    Transfers Overall transfer level: Needs assistance Equipment used: Rolling walker (2 wheels) Transfers: Sit to/from Stand Sit to Stand: Mod assist, +2 physical assistance, +2 safety/equipment, From elevated surface    General transfer comment: pt stood 2 x total but is very fear limited more so than strengh limited. Continued encouragement and discussion about fear versus strength limitations.    Ambulation/Gait    General Gait Details: current unable   Balance Overall balance assessment: Needs assistance Sitting-balance support: Feet supported Sitting balance-[name] Scale: Good     Standing balance support: Bilateral upper extremity supported, Reliant on assistive device for balance Standing balance-[name] Scale: Poor Standing balance comment: limited tolerance     Cognition Arousal/Alertness: Awake/alert Behavior During Therapy: WFL for tasks assessed/performed Overall Cognitive Status: Within Functional Limits for tasks assessed Area of Impairment: Safety/judgement, Awareness        Following Commands: Follows one step commands consistently, Follows multi-step commands inconsistently Safety/Judgement: Decreased awareness of safety, Decreased awareness of deficits   Problem Solving: Requires verbal cues General Comments: Pt was in negitive mindset upon arriving. Lengthy education about how his attitude greatly impacts his progress and  participation with PT. Continued discussion about  how his fear of falling is impacting his progress towards goals.           General Comments General comments (skin integrity, edema, etc.): Pt was more limited today than past few session. Will need continued skilled therapy to maximize independence      Pertinent Vitals/Pain Pain Assessment Pain Assessment: No/denies pain Pain Score: 0-No pain Pain Location: no pain reported today     PT Goals (current goals can now be found in the care plan section) Acute Rehab PT Goals Patient Stated Goal: to be able to walk Progress towards PT goals: Progressing toward goals    Frequency    Min 2X/week      PT Plan Current plan remains appropriate       AM-PAC PT "6 Clicks" Mobility   Outcome Measure  Help needed turning from your back to your side while in a flat bed without using bedrails?: A Lot Help needed moving from lying on your back to sitting on the side of a flat bed without using bedrails?: A Lot Help needed moving to and from a bed to a chair (including a wheelchair)?: Total Help needed standing up from a chair using your arms (e.g., wheelchair or bedside chair)?: Total Help needed to walk in hospital room?: Total Help needed climbing 3-5 steps with a railing? : Total 6 Click Score: 8    End of Session Equipment Utilized During Treatment:  (green bariatric RW/pt's  personal shoes) Activity Tolerance: Other (comment) (limited by anxiety/fear) Patient left: in bed;with call bell/phone within reach Nurse Communication: Mobility status PT Visit Diagnosis: Unsteadiness on feet (R26.81);Muscle weakness (generalized) (M62.81);Difficulty in walking, not elsewhere classified (R26.2)     Time: 1355-1420 PT Time Calculation (min) (ACUTE ONLY): 25 min  Charges:  $Therapeutic Activity: 23-37 mins                     Julaine Fusi PTA 02/15/22, 2:56 PM

## 2022-02-15 NOTE — Progress Notes (Signed)
Gary [name] NAME: Gary [name]    MR#:  [id number]  DATE OF BIRTH:  12-03-1955  SUBJECTIVE:  No new complaints Motivated to work with PT    VITALS:  Blood pressure (!) 124/93, pulse 83, temperature 98.2 F (36.8 C), temperature source Oral, resp. rate (!) 23, height '6\' 3"'$  (1.905 m), weight (!) 195 kg, SpO2 93 %.  PHYSICAL EXAMINATION:   GENERAL:  66 y.o.-year-old patient lying in the bed with no acute distress. Morbid obesity Trach collar + LUNGS: decreased  breath sounds bilaterally, no wheezing CARDIOVASCULAR: S1, S2 normal. No murmurs,   ABDOMEN: Soft, nontender, nondistended. Bowel sounds present.  EXTREMITIES: ++ edema b/l.    NEUROLOGIC: nonfocal  patient is alert and awake SKIN: No obvious rash, lesion, or ulcer.   LABORATORY PANEL:  CBC Recent Labs  Lab 02/14/22 1454  WBC 7.8  HGB 10.3*  HCT 37.5*  PLT 225     Chemistries  Recent Labs  Lab 02/14/22 1454  NA 138  K 4.6  CL 102  CO2 31  GLUCOSE 113*  BUN 34*  CREATININE 1.25*  CALCIUM 8.7*  MG 2.1      Gary [name] is a 66 y.o. male with medical history significant for diastolic CHF, chronic respiratory failure status post tracheostomy, COPD, essential hypertension, PUD and sleep apnea who presented to the emergency room with acute onset of worsening dyspnea.     Apparently patient had been in the ER for 10 days as he was unable to be cared at home specially requirement of tracheostomy care, awaiting placement. Unfortunately he declined during his ER stay and in spite of being on torsemide developed worsening dyspnea and peripheral edema.  Repeat chest x-ray with worsening pulmonary edema.  Pt was admitted for management of acute on chronic diastolic heart failure.   Assessment & Plan:  Acute on chronic diastolic (congestive) heart failure  Patient to have some peripheral edema and concern of pulmonary vascular congestion on chest x-ray.  BNP  was actually wnl. Some worsening of creatinine after increasing torsemide dose to 40. --cont torsemide   Acute on chronic respiratory failure with hypoxia  - Currently stable.  At baseline requires intermittent vent support with trach collar at night time--pt using his own machine --Pt wants to remove trach, however, per pulm Dr. Lanney Gins, "pt continues to need ventilator and has no family that is willing to help him. If we take it out, there may be tracheomalacia from scarring and next time he comes to be intubated it will be complicated he may code and die. So for now he needs to get better and get out of hospital then we can assess once in chronic stable state" as outpatient.   AKI (acute kidney injury) --Cr peaked at 1.51, baseline around 1.2.  Back to baseline.   Chronic obstructive pulmonary disease (COPD) ([name]) - Continue bronchodilators and home inhalers   GERD without esophagitis - cont PPI   Obesity, Class III, BMI 40-49.9 (morbid obesity) ([place]) Estimated body mass index is 48.12 kg/m as calculated from the following:   Height as of this encounter: '6\' 3"'$  (1.905 m).   Weight as of this encounter: 174.6 kg.    Anxiety and depression -continue Lexapro and Klonopin as well as Seroquel.   Dyslipidemia --continue statin therapy.   Microcytic iron def anemia --cont iron supplement     DVT prophylaxis: [id number] Code Status: Full code  Family Communication:  Level of care: Stepdown Dispo:   The patient is from: home Anticipated d/c is to: SNF Anticipated d/c date is: whenever bed available         TOTAL TIME TAKING CARE OF THIS PATIENT: 25 minutes.  >50% time spent on counselling and coordination of care  Note: This dictation was prepared with Dragon dictation along with smaller phrase technology. Any transcriptional errors that result from this process are unintentional.  Fritzi Mandes M.D    [facility]   CC: Primary care physician; Center, [facility]

## 2022-02-16 DIAGNOSIS — I5033 Acute on chronic diastolic (congestive) heart failure: Secondary | ICD-10-CM | POA: Diagnosis not present

## 2022-02-16 LAB — BASIC METABOLIC PANEL
Anion gap: 7 (ref 5–15)
BUN: 36 mg/dL — ABNORMAL HIGH (ref 8–23)
CO2: 31 mmol/L (ref 22–32)
Calcium: 9 mg/dL (ref 8.9–10.3)
Chloride: 102 mmol/L (ref 98–111)
Creatinine, Ser: 1.43 mg/dL — ABNORMAL HIGH (ref 0.61–1.24)
GFR, Estimated: 55 mL/min — ABNORMAL LOW (ref >60)
Glucose, Bld: 141 mg/dL — ABNORMAL HIGH (ref 70–99)
Potassium: 3.7 mmol/L (ref 3.5–5.1)
Sodium: 140 mmol/L (ref 135–145)

## 2022-02-16 MED ORDER — SODIUM CHLORIDE FLUSH 0.9 % IV SOLN
INTRAVENOUS | Status: AC
  Filled 2022-02-16: qty 3

## 2022-02-16 NOTE — Progress Notes (Signed)
Throughout the day patient has been irritable especially in instances when desires are unattainable or can not be instantly met.  Patient is resistant to care such as having temperature taken or blood pressure obtained, but becomes vile with nurse on care he desires to receive. Despite efforts to offer care needed to the patient, patient was vile and verbally inappropriate.  Will endorse to oncoming shift.

## 2022-02-16 NOTE — Progress Notes (Signed)
[place] at [place] NAME: Gary [name]    MR#:  [id number]  DATE OF BIRTH:  1955-12-08  SUBJECTIVE:  No new complaints Motivated to work with PT    VITALS:  Blood pressure 119/83, pulse 77, temperature 97.9 F (36.6 C), temperature source Axillary, resp. rate (!) 22, height '6\' 3"'$  (1.905 m), weight (!) 197.8 kg, SpO2 99 %.  PHYSICAL EXAMINATION:   GENERAL:  66 y.o.-year-old patient lying in the bed with no acute distress. Morbid obesity Trach collar + LUNGS: decreased  breath sounds bilaterally, no wheezing CARDIOVASCULAR: S1, S2 normal. No murmurs,   ABDOMEN: Soft, nontender, nondistended. Bowel sounds present.  EXTREMITIES: ++ edema b/l.    NEUROLOGIC: nonfocal  patient is alert and awake SKIN: No obvious rash, lesion, or ulcer.   LABORATORY PANEL:  CBC Recent Labs  Lab 02/14/22 1454  WBC 7.8  HGB 10.3*  HCT 37.5*  PLT 225     Chemistries  Recent Labs  Lab 02/14/22 1454 02/16/22 0913  NA 138 140  K 4.6 3.7  CL 102 102  CO2 31 31  GLUCOSE 113* 141*  BUN 34* 36*  CREATININE 1.25* 1.43*  CALCIUM 8.7* 9.0  MG 2.1  --       Gary [name] is a 66 y.o. male with medical history significant for diastolic CHF, chronic respiratory failure status post tracheostomy, COPD, essential hypertension, PUD and sleep apnea who presented to the emergency room with acute onset of worsening dyspnea.     Apparently patient had been in the ER for 10 days as he was unable to be cared at home specially requirement of tracheostomy care, awaiting placement. Unfortunately he declined during his ER stay and in spite of being on torsemide developed worsening dyspnea and peripheral edema.  Repeat chest x-ray with worsening pulmonary edema.  Pt was admitted for management of acute on chronic diastolic heart failure.   Assessment & Plan:  Acute on chronic diastolic (congestive) heart failure  Patient to have some peripheral edema and concern  of pulmonary vascular congestion on chest x-ray.  BNP was actually wnl. Some worsening of creatinine after increasing torsemide dose to 40. --cont torsemide   Acute on chronic respiratory failure with hypoxia  - Currently stable.  At baseline requires intermittent vent support with trach collar at night time--pt using his own machine --Pt wants to remove trach, however, per pulm Dr. Lanney Gins, "pt continues to need ventilator and has no family that is willing to help him. If we take it out, there may be tracheomalacia from scarring and next time he comes to be intubated it will be complicated he may code and die. So for now he needs to get better and get out of hospital then we can assess once in chronic stable state" as outpatient.   AKI (acute kidney injury) --Cr peaked at 1.51, baseline around 1.2.  Back to baseline.   Chronic obstructive pulmonary disease (COPD) ([name]) - Continue bronchodilators and home inhalers   GERD without esophagitis - cont PPI   Obesity, Class III, BMI 40-49.9 (morbid obesity) ([place]) Estimated body mass index is 48.12 kg/m as calculated from the following:   Height as of this encounter: '6\' 3"'$  (1.905 m).   Weight as of this encounter: 174.6 kg.    Anxiety and depression -continue Lexapro and Klonopin as well as Seroquel.   Dyslipidemia --continue statin therapy.   Microcytic iron def anemia --cont iron supplement  DVT prophylaxis: MV:HQIONGE Code Status: Full code  Family Communication:  Level of care: Stepdown Dispo:   The patient is from: home Anticipated d/c is to: SNF Anticipated d/c date is: whenever bed available         TOTAL TIME TAKING CARE OF THIS PATIENT: 25 minutes.  >50% time spent on counselling and coordination of care  Note: This dictation was prepared with Dragon dictation along with smaller phrase technology. Any transcriptional errors that result from this process are unintentional.  Fritzi Mandes M.D    [facility]   CC: Primary care physician; Center, [facility]

## 2022-02-16 NOTE — TOC Progression Note (Signed)
Transition of Care [facility]) - Progression Note    Patient Details  Name: LUCHIANO VISCOMI MRN: [id number] Date of Birth: 02-12-1956  Transition of Care [facility]) CM/SW Contact  Shelbie Hutching, RN Phone Number: 02/16/2022, 4:30 PM  Clinical Narrative:    Lissa Merlin from [facility] will come on Monday to assess patient.     Expected Discharge Plan: [place] Barriers to Discharge: Continued Medical Work up, Ship broker, Unsafe home situation  Expected Discharge Plan and Services Expected Discharge Plan: [place] In-house Referral: Clinical Social Work     Living arrangements for the past 2 months: Single Family Home                                       Social Determinants of Health (SDOH) Interventions    Readmission Risk Interventions    12/31/2021    8:58 AM 12/20/2021    4:21 PM 05/04/2021    1:26 PM  Readmission Risk Prevention Plan  Transportation Screening Complete Complete Complete  Medication Review Press photographer) Complete Complete Complete  PCP or Specialist appointment within 3-5 days of discharge Complete Complete Complete  HRI or Home Care Consult Complete Complete Complete  SW Recovery Care/Counseling Consult Complete Complete Complete  Palliative Care Screening Complete Complete Not [place] Not Applicable Not Applicable Not Applicable

## 2022-02-16 NOTE — TOC Progression Note (Signed)
Transition of Care [facility]) - Progression Note    Patient Details  Name: Gary [name] MRN: [id number] Date of Birth: March 13, 1956  Transition of Care [facility]) CM/SW Contact  Shelbie Hutching, RN Phone Number: 02/16/2022, 1:46 PM  Clinical Narrative:    RNCM called Shaun Arbaugh-Morales with [place] [phone],  left a message for return call to see if he can assist with placement.  [facility] owns SNF's throughout the [place], including [place], [place].     Expected Discharge Plan: [place] Barriers to Discharge: Continued Medical Work up, Ship broker, Unsafe home situation  Expected Discharge Plan and Services Expected Discharge Plan: [place] In-house Referral: Clinical Social Work     Living arrangements for the past 2 months: Single Family Home                                       Social Determinants of Health (SDOH) Interventions    Readmission Risk Interventions    12/31/2021    8:58 AM 12/20/2021    4:21 PM 05/04/2021    1:26 PM  Readmission Risk Prevention Plan  Transportation Screening Complete Complete Complete  Medication Review Press photographer) Complete Complete Complete  PCP or Specialist appointment within 3-5 days of discharge Complete Complete Complete  HRI or Home Care Consult Complete Complete Complete  SW Recovery Care/Counseling Consult Complete Complete Complete  Palliative Care Screening Complete Complete Not [place] Not Applicable Not Applicable Not Applicable

## 2022-02-16 NOTE — Plan of Care (Signed)
Continuing with plan of care. 

## 2022-02-17 DIAGNOSIS — I5033 Acute on chronic diastolic (congestive) heart failure: Secondary | ICD-10-CM | POA: Diagnosis not present

## 2022-02-17 MED ORDER — TORSEMIDE 20 MG PO TABS
20.0000 mg | ORAL_TABLET | Freq: Every day | ORAL | Status: DC
  Administered 2022-02-17 – 2022-04-06 (×49): 20 mg via ORAL
  Filled 2022-02-17 (×48): qty 1

## 2022-02-17 NOTE — Progress Notes (Signed)
[place] at [place] NAME: Gary [name]    MR#:  [id number]  DATE OF BIRTH:  1956-02-01  SUBJECTIVE:  No new complaints Held torsemide due to mild bump in creat  VITALS:  Blood pressure 116/67, pulse 82, temperature 98.2 F (36.8 C), temperature source Oral, resp. rate 19, height '6\' 3"'$  (1.905 m), weight (!) 189.6 kg, SpO2 94 %.  PHYSICAL EXAMINATION:   GENERAL:  66 y.o.-year-old patient lying in the bed with no acute distress. Morbid obesity Trach collar + LUNGS: decreased  breath sounds bilaterally, no wheezing CARDIOVASCULAR: S1, S2 normal. No murmurs,   ABDOMEN: Soft, nontender, nondistended. Bowel sounds present.  EXTREMITIES: ++ edema b/l.    NEUROLOGIC: nonfocal  patient is alert and awake SKIN: No obvious rash, lesion, or ulcer.   LABORATORY PANEL:  CBC Recent Labs  Lab 02/14/22 1454  WBC 7.8  HGB 10.3*  HCT 37.5*  PLT 225     Chemistries  Recent Labs  Lab 02/14/22 1454 02/16/22 0913  NA 138 140  K 4.6 3.7  CL 102 102  CO2 31 31  GLUCOSE 113* 141*  BUN 34* 36*  CREATININE 1.25* 1.43*  CALCIUM 8.7* 9.0  MG 2.1  --       Gary [name] is a 66 y.o. male with medical history significant for diastolic CHF, chronic respiratory failure status post tracheostomy, COPD, essential hypertension, PUD and sleep apnea who presented to the emergency room with acute onset of worsening dyspnea.     Apparently patient had been in the ER for 10 days as he was unable to be cared at home specially requirement of tracheostomy care, awaiting placement. Unfortunately he declined during his ER stay and in spite of being on torsemide developed worsening dyspnea and peripheral edema.  Repeat chest x-ray with worsening pulmonary edema.  Pt was admitted for management of acute on chronic diastolic heart failure.   Assessment & Plan:  Acute on chronic diastolic (congestive) heart failure  Patient to have some peripheral edema and  concern of pulmonary vascular congestion on chest x-ray.  BNP was actually wnl. Some worsening of creatinine after increasing torsemide dose to 40. --cont torsemide   Acute on chronic respiratory failure with hypoxia  - Currently stable.  At baseline requires intermittent vent support with trach collar at night time--pt using his own machine --Pt wants to remove trach, however, per pulm Dr. Lanney Gins, "pt continues to need ventilator and has no family that is willing to help him. If we take it out, there may be tracheomalacia from scarring and next time he comes to be intubated it will be complicated he may code and die. So for now he needs to get better and get out of hospital then we can assess once in chronic stable state" as outpatient.   AKI (acute kidney injury) --Cr peaked at 1.51, baseline around 1.2.  Back to baseline.   Chronic obstructive pulmonary disease (COPD) ([name]) - Continue bronchodilators and home inhalers   GERD without esophagitis - cont PPI   Obesity, Class III, BMI 40-49.9 (morbid obesity) ([place]) Estimated body mass index is 48.12 kg/m as calculated from the following:   Height as of this encounter: '6\' 3"'$  (1.905 m).   Weight as of this encounter: 174.6 kg.    Anxiety and depression -continue Lexapro and Klonopin as well as Seroquel.   Dyslipidemia --continue statin therapy.   Microcytic iron def anemia --cont iron supplement  DVT prophylaxis: [id number] Code Status: Full code  Family Communication:  Level of care: Stepdown Dispo:   The patient is from: home Anticipated d/c is to: SNF Anticipated d/c date is: whenever bed available         TOTAL TIME TAKING CARE OF THIS PATIENT: 25 minutes.  >50% time spent on counselling and coordination of care  Note: This dictation was prepared with Dragon dictation along with smaller phrase technology. Any transcriptional errors that result from this process are unintentional.  Fritzi Mandes M.D    [facility]   CC: Primary care physician; Center, [facility]

## 2022-02-18 DIAGNOSIS — I5033 Acute on chronic diastolic (congestive) heart failure: Secondary | ICD-10-CM | POA: Diagnosis not present

## 2022-02-18 LAB — CREATININE, SERUM
Creatinine, Ser: 1.37 mg/dL — ABNORMAL HIGH (ref 0.61–1.24)
GFR, Estimated: 58 mL/min — ABNORMAL LOW (ref >60)

## 2022-02-18 NOTE — Progress Notes (Signed)
Suctioned pt for mod amt of thick tan secretions and placed him on home Trilogy vent with 5 L O2 bleeding. Tol well. O2 sat 94

## 2022-02-18 NOTE — Progress Notes (Signed)
RN stated she will perform trach care and suction as needed. She will call me if needed.

## 2022-02-18 NOTE — Progress Notes (Signed)
Bariatric chair placed in patients room. Patient states he wants to get into it today but then later declined, Chair left in room in case he changes his mind.

## 2022-02-18 NOTE — Progress Notes (Signed)
Patient request for RT to perform suction, states he does not want me to do it. I let RT know.

## 2022-02-18 NOTE — Plan of Care (Signed)
Patient remains in SDU. ATC via day and ventilation via home Trilogy vent at HS. VS WNL and stable. No active infusions.   Problem: Education: Goal: Ability to demonstrate management of disease process will improve Outcome: Not Progressing Goal: Ability to verbalize understanding of medication therapies will improve Outcome: Not Progressing   Problem: Activity: Goal: Capacity to carry out activities will improve Outcome: Not Progressing   Problem: Cardiac: Goal: Ability to achieve and maintain adequate cardiopulmonary perfusion will improve Outcome: Not Progressing   Problem: Education: Goal: Knowledge of General Education information will improve Description: Including pain rating scale, medication(s)/side effects and non-pharmacologic comfort measures Outcome: Not Progressing   Problem: Health Behavior/Discharge Planning: Goal: Ability to manage health-related needs will improve Outcome: Not Progressing   Problem: Clinical Measurements: Goal: Ability to maintain clinical measurements within normal limits will improve Outcome: Not Progressing Goal: Will remain free from infection Outcome: Not Progressing Goal: Diagnostic test results will improve Outcome: Not Progressing Goal: Respiratory complications will improve Outcome: Not Progressing Goal: Cardiovascular complication will be avoided Outcome: Not Progressing   Problem: Activity: Goal: Risk for activity intolerance will decrease Outcome: Not Progressing   Problem: Nutrition: Goal: Adequate nutrition will be maintained Outcome: Not Progressing   Problem: Coping: Goal: Level of anxiety will decrease Outcome: Not Progressing   Problem: Elimination: Goal: Will not experience complications related to bowel motility Outcome: Not Progressing Goal: Will not experience complications related to urinary retention Outcome: Not Progressing   Problem: Pain Managment: Goal: General experience of comfort will  improve Outcome: Not Progressing   Problem: Safety: Goal: Ability to remain free from injury will improve Outcome: Not Progressing   Problem: Skin Integrity: Goal: Risk for impaired skin integrity will decrease Outcome: Not Progressing

## 2022-02-18 NOTE — Progress Notes (Signed)
1630 Report received from [facility]. Gary [name] happily watching TV. No complaints.

## 2022-02-18 NOTE — Progress Notes (Signed)
[place] [facility] [place] NAME: Gary [name]    MR#:  [id number]  DATE OF BIRTH:  11/12/1955  SUBJECTIVE:  No new complaints Mucous phlegm thru trach tube  VITALS:  Blood pressure 122/88, pulse 81, temperature 98.6 F (37 C), temperature source Oral, resp. rate 18, height '6\' 3"'$  (1.905 m), weight (!) 189.6 kg, SpO2 91 %.  PHYSICAL EXAMINATION:   GENERAL:  66 y.o.-year-old patient lying in the bed with no acute distress. Morbid obesity Trach collar + LUNGS: decreased  breath sounds bilaterally, no wheezing CARDIOVASCULAR: S1, S2 normal. No murmurs,   ABDOMEN: Soft, nontender, nondistended. Bowel sounds present.  EXTREMITIES: ++ edema b/l.    NEUROLOGIC: nonfocal  patient is alert and awake SKIN: No obvious rash, lesion, or ulcer.   LABORATORY PANEL:  CBC Recent Labs  Lab 02/14/22 1454  WBC 7.8  HGB 10.3*  HCT 37.5*  PLT 225     Chemistries  Recent Labs  Lab 02/14/22 1454 02/16/22 0913 02/18/22 0428  NA 138 140  --   K 4.6 3.7  --   CL 102 102  --   CO2 31 31  --   GLUCOSE 113* 141*  --   BUN 34* 36*  --   CREATININE 1.25* 1.43* 1.37*  CALCIUM 8.7* 9.0  --   MG 2.1  --   --       AMAIR SHROUT is a 66 y.o. male with medical history significant for diastolic CHF, chronic respiratory failure status post tracheostomy, COPD, essential hypertension, PUD and sleep apnea who presented to the emergency room with acute onset of worsening dyspnea.     Apparently patient had been in the ER for 10 days as he was unable to be cared at home specially requirement of tracheostomy care, awaiting placement. Unfortunately he declined during his ER stay and in spite of being on torsemide developed worsening dyspnea and peripheral edema.  Repeat chest x-ray with worsening pulmonary edema.  Pt was admitted for management of acute on chronic diastolic heart failure.   Assessment & Plan:  Acute on chronic diastolic (congestive) heart failure   Patient to have some peripheral edema and concern of pulmonary vascular congestion on chest x-ray.  BNP was actually wnl. --cont torsemide   Acute on chronic respiratory failure with hypoxia  - Currently stable.  At baseline requires intermittent vent support with trach collar at night time--pt using his own machine --Pt wants to remove trach, however, per pulm Dr. Lanney Gins, "pt continues to need ventilator and has no family that is willing to help him. If we take it out, there may be tracheomalacia from scarring and next time he comes to be intubated it will be complicated he may code and die. So for now he needs to get better and get out of hospital then we can assess once in chronic stable state" as outpatient.   AKI (acute kidney injury) --Cr peaked at 1.51, baseline around 1.2.  Back to baseline.   Chronic obstructive pulmonary disease (COPD) ([name]) - Continue bronchodilators and home inhalers   GERD without esophagitis - cont PPI   Obesity, Class III, BMI 40-49.9 (morbid obesity) ([place]) Estimated body mass index is 48.12 kg/m as calculated from the following:   Height as of this encounter: '6\' 3"'$  (1.905 m).   Weight as of this encounter: 174.6 kg.    Anxiety and depression -continue Lexapro and Klonopin as well as Seroquel.   Dyslipidemia --continue  statin therapy.   Microcytic iron def anemia --cont iron supplement     DVT prophylaxis: [id number] Code Status: Full code  Family Communication:  Level of care: Stepdown Dispo:   The patient is from: home Anticipated d/c is to: SNF Anticipated d/c date is: whenever bed available         TOTAL TIME TAKING CARE OF THIS PATIENT: 25 minutes.  >50% time spent on counselling and coordination of care  Note: This dictation was prepared with Dragon dictation along with smaller phrase technology. Any transcriptional errors that result from this process are unintentional.  Fritzi Mandes M.D    [facility]    CC: Primary care physician; Center, [facility]

## 2022-02-18 NOTE — Progress Notes (Signed)
Patient called RN into the room to request a [place] sandwich. RN informed the patient there are no [place] sandwiches available on this unit and we would find one as soon as possible. Patient yelled to the RN: "F++k off. I have one every night!" The ICU unit secretary was able to locate a [place] sandwich and provide it to the patient. The patient then called the RN back into the room to state "Do not pull my pills tonight. I do not trust you. You are on the [employer] s++t. All you young Sherrilyn Nairn men a racist. You're all jealous. Don't you bring my meds!" Charge RN, Mordecai Maes, notified. Charge RN administered patient's medications for the 2200-hr med pass.

## 2022-02-19 ENCOUNTER — Inpatient Hospital Stay: Payer: Medicaid Other

## 2022-02-19 DIAGNOSIS — I5033 Acute on chronic diastolic (congestive) heart failure: Secondary | ICD-10-CM | POA: Diagnosis not present

## 2022-02-19 MED ORDER — ACETYLCYSTEINE 20 % IN SOLN
600.0000 mg | Freq: Two times a day (BID) | RESPIRATORY_TRACT | Status: DC
  Filled 2022-02-19 (×2): qty 4

## 2022-02-19 MED ORDER — ACETYLCYSTEINE 20 % IN SOLN
3.0000 mL | Freq: Two times a day (BID) | RESPIRATORY_TRACT | Status: DC
  Administered 2022-02-19 – 2022-02-20 (×2): 3 mL via RESPIRATORY_TRACT
  Filled 2022-02-19: qty 4

## 2022-02-19 MED ORDER — ACETYLCYSTEINE 20 % IN SOLN: 4.0000 mL | Freq: Two times a day (BID) | RESPIRATORY_TRACT | Status: DC

## 2022-02-19 MED ORDER — ACETYLCYSTEINE 20 % IN SOLN: 600.0000 mg | Freq: Two times a day (BID) | RESPIRATORY_TRACT | Status: DC

## 2022-02-19 NOTE — Plan of Care (Signed)
  Problem: Education: Goal: Ability to demonstrate management of disease process will improve Outcome: Not Progressing Goal: Ability to verbalize understanding of medication therapies will improve Outcome: Not Progressing   Problem: Activity: Goal: Capacity to carry out activities will improve Outcome: Not Progressing   Problem: Cardiac: Goal: Ability to achieve and maintain adequate cardiopulmonary perfusion will improve Outcome: Not Progressing   Problem: Education: Goal: Knowledge of General Education information will improve Description: Including pain rating scale, medication(s)/side effects and non-pharmacologic comfort measures Outcome: Not Progressing   Problem: Health Behavior/Discharge Planning: Goal: Ability to manage health-related needs will improve Outcome: Not Progressing   Problem: Clinical Measurements: Goal: Ability to maintain clinical measurements within normal limits will improve Outcome: Not Progressing Goal: Will remain free from infection Outcome: Not Progressing Goal: Diagnostic test results will improve Outcome: Not Progressing Goal: Respiratory complications will improve Outcome: Not Progressing Goal: Cardiovascular complication will be avoided Outcome: Not Progressing   Problem: Activity: Goal: Risk for activity intolerance will decrease Outcome: Not Progressing   Problem: Nutrition: Goal: Adequate nutrition will be maintained Outcome: Not Progressing   Problem: Coping: Goal: Level of anxiety will decrease Outcome: Not Progressing   Problem: Elimination: Goal: Will not experience complications related to bowel motility Outcome: Not Progressing Goal: Will not experience complications related to urinary retention Outcome: Not Progressing   Problem: Pain Managment: Goal: General experience of comfort will improve Outcome: Not Progressing   Problem: Safety: Goal: Ability to remain free from injury will improve Outcome: Not  Progressing   Problem: Skin Integrity: Goal: Risk for impaired skin integrity will decrease Outcome: Not Progressing

## 2022-02-19 NOTE — Progress Notes (Signed)
PT Cancellation Note  Patient Details Name: Gary [name] MRN: [id number] DOB: 17-May-1955   Cancelled Treatment:    Reason Eval/Treat Not Completed: Patient declined, no reason specified (Patient declined PT today. He wants PT to return tomorrow for standing attempts.)  [name], PT, MPT  [name] 02/19/2022, 3:21 PM

## 2022-02-19 NOTE — Progress Notes (Signed)
Patient refused assessment of BP and Temp this AM; stating to this RN: "Get out" and "I'll do it later".

## 2022-02-19 NOTE — Progress Notes (Signed)
[place] at [place] NAME: Gary [name]    MR#:  [id number]  DATE OF BIRTH:  Jan 29, 1956  SUBJECTIVE:  No new complaints He wants to sit in the chair today  VITALS:  Blood pressure 115/74, pulse 85, temperature 98.7 F (37.1 C), temperature source Oral, resp. rate 16, height '6\' 3"'$  (1.905 m), weight (!) 191 kg, SpO2 96 %.  PHYSICAL EXAMINATION:   GENERAL:  66 y.o.-year-old patient lying in the bed with no acute distress. Morbid obesity Trach collar + LUNGS: decreased  breath sounds bilaterally, no wheezing CARDIOVASCULAR: S1, S2 normal. No murmurs,   ABDOMEN: Soft, nontender, nondistended. Bowel sounds present.  EXTREMITIES: ++ edema b/l.    NEUROLOGIC: nonfocal  patient is alert and awake SKIN: No obvious rash, lesion, or ulcer.   LABORATORY PANEL:  CBC Recent Labs  Lab 02/14/22 1454  WBC 7.8  HGB 10.3*  HCT 37.5*  PLT 225     Chemistries  Recent Labs  Lab 02/14/22 1454 02/16/22 0913 02/18/22 0428  NA 138 140  --   K 4.6 3.7  --   CL 102 102  --   CO2 31 31  --   GLUCOSE 113* 141*  --   BUN 34* 36*  --   CREATININE 1.25* 1.43* 1.37*  CALCIUM 8.7* 9.0  --   MG 2.1  --   --       Gary [name] is a 66 y.o. male with medical history significant for diastolic CHF, chronic respiratory failure status post tracheostomy, COPD, essential hypertension, PUD and sleep apnea who presented to the emergency room with acute onset of worsening dyspnea.     Apparently patient had been in the ER for 10 days as he was unable to be cared at home specially requirement of tracheostomy care, awaiting placement. Unfortunately he declined during his ER stay and in spite of being on torsemide developed worsening dyspnea and peripheral edema.  Repeat chest x-ray with worsening pulmonary edema.  Pt was admitted for management of acute on chronic diastolic heart failure.   Assessment & Plan:  Acute on chronic diastolic (congestive) heart  failure  Patient to have some peripheral edema and concern of pulmonary vascular congestion on chest x-ray.  BNP was actually wnl. --cont torsemide   Acute on chronic respiratory failure with hypoxia  - Currently stable.  At baseline requires intermittent vent support with trach collar at night time--pt using his own machine --Pt wants to remove trach, however, per pulm Dr. Lanney Gins, "pt continues to need ventilator and has no family that is willing to help him. If we take it out, there may be tracheomalacia from scarring and next time he comes to be intubated it will be complicated he may code and die. So for now he needs to get better and get out of hospital then we can assess once in chronic stable state" as outpatient.   AKI (acute kidney injury) --Cr peaked at 1.51, baseline around 1.2.  Back to baseline.   Chronic obstructive pulmonary disease (COPD) ([name]) - Continue bronchodilators and home inhalers   GERD without esophagitis - cont PPI   Obesity, Class III, BMI 40-49.9 (morbid obesity) ([place]) Estimated body mass index is 48.12 kg/m as calculated from the following:   Height as of this encounter: '6\' 3"'$  (1.905 m).   Weight as of this encounter: 174.6 kg.    Anxiety and depression -continue Lexapro and Klonopin as well as Seroquel.  Dyslipidemia --continue statin therapy.   Microcytic iron def anemia --cont iron supplement     DVT prophylaxis: [id number] Code Status: Full code  Family Communication:  Level of care: Stepdown Dispo:   The patient is from: home Anticipated d/c is to: SNF Anticipated d/c date is: whenever bed available         TOTAL TIME TAKING CARE OF THIS PATIENT: 25 minutes.  >50% time spent on counselling and coordination of care  Note: This dictation was prepared with Dragon dictation along with smaller phrase technology. Any transcriptional errors that result from this process are unintentional.  Fritzi Mandes M.D    [facility]    CC: Primary care physician; Center, [facility]

## 2022-02-19 NOTE — TOC Progression Note (Signed)
Transition of Care [facility]) - Progression Note    Patient Details  Name: Gary [name] MRN: [id number] Date of Birth: 1955-04-23  Transition of [facility]) CM/SW Contact  Shelbie Hutching, RN Phone Number: 02/19/2022, 2:12 PM  Clinical Narrative:    Shaun with [facility] did come by the hospital to speak with patient today.  Hopefully he will have a facility that will be able to meet the patient's needs.     Expected Discharge Plan: [place] Barriers to Discharge: Continued Medical Work up, Ship broker, Unsafe home situation  Expected Discharge Plan and Services Expected Discharge Plan: [place] In-house Referral: Clinical Social Work     Living arrangements for the past 2 months: Single Family Home                                       Social Determinants of Health (SDOH) Interventions    Readmission Risk Interventions    12/31/2021    8:58 AM 12/20/2021    4:21 PM 05/04/2021    1:26 PM  Readmission Risk Prevention Plan  Transportation Screening Complete Complete Complete  Medication Review Press photographer) Complete Complete Complete  PCP or Specialist appointment within 3-5 days of discharge Complete Complete Complete  HRI or Home Care Consult Complete Complete Complete  SW Recovery Care/Counseling Consult Complete Complete Complete  Palliative Care Screening Complete Complete Not [place] Not Applicable Not Applicable Not Applicable

## 2022-02-20 DIAGNOSIS — I5033 Acute on chronic diastolic (congestive) heart failure: Secondary | ICD-10-CM | POA: Diagnosis not present

## 2022-02-20 LAB — BASIC METABOLIC PANEL
Anion gap: 7 (ref 5–15)
BUN: 36 mg/dL — ABNORMAL HIGH (ref 8–23)
CO2: 29 mmol/L (ref 22–32)
Calcium: 9.1 mg/dL (ref 8.9–10.3)
Chloride: 103 mmol/L (ref 98–111)
Creatinine, Ser: 1.45 mg/dL — ABNORMAL HIGH (ref 0.61–1.24)
GFR, Estimated: 54 mL/min — ABNORMAL LOW (ref >60)
Glucose, Bld: 118 mg/dL — ABNORMAL HIGH (ref 70–99)
Potassium: 3.7 mmol/L (ref 3.5–5.1)
Sodium: 139 mmol/L (ref 135–145)

## 2022-02-20 MED ORDER — ACETYLCYSTEINE 20 % IN SOLN
3.0000 mL | Freq: Two times a day (BID) | RESPIRATORY_TRACT | Status: DC
  Administered 2022-02-20: 3 mL via RESPIRATORY_TRACT
  Filled 2022-02-20: qty 4

## 2022-02-20 MED ORDER — GABAPENTIN 100 MG PO CAPS
100.0000 mg | ORAL_CAPSULE | Freq: Three times a day (TID) | ORAL | Status: DC
  Administered 2022-02-20 – 2022-02-27 (×20): 100 mg via ORAL
  Filled 2022-02-20 (×20): qty 1
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
## 2022-02-20 NOTE — Progress Notes (Signed)
OT Cancellation Note  Patient Details Name: Gary [name] MRN: [id number] DOB: 20-Sep-1955   Cancelled Treatment:    Reason Eval/Treat Not Completed: Patient declined, no reason specified. Order received, chart reviewed. Upon arrival pt reports anxiousness re: unable to contact his partner. States he is awaiting a call and cannot focus on therapy at this time. Will continue to follow.   [name], M.S. OTR/L  02/20/22, 1:48 PM  ascom [phone]

## 2022-02-20 NOTE — TOC Progression Note (Signed)
Transition of Care [facility]) - Progression Note    Patient Details  Name: Gary [name] MRN: [id number] Date of Birth: November 18, 1955  Transition of Care [facility]) CM/SW Contact  Shelbie Hutching, RN Phone Number: 02/20/2022, 4:14 PM  Clinical Narrative:    Received a call from [facility], currently they are unable to accept patient due to his night time NIV- it is considered a vent and [place] does not have a vent licensed facility.  RNCM will reach out to vent trach SNF's.     Expected Discharge Plan: [place] Barriers to Discharge: Continued Medical Work up, Ship broker, Unsafe home situation  Expected Discharge Plan and Services Expected Discharge Plan: [place] In-house Referral: Clinical Social Work     Living arrangements for the past 2 months: Single Family Home                                       Social Determinants of Health (SDOH) Interventions    Readmission Risk Interventions    12/31/2021    8:58 AM 12/20/2021    4:21 PM 05/04/2021    1:26 PM  Readmission Risk Prevention Plan  Transportation Screening Complete Complete Complete  Medication Review Press photographer) Complete Complete Complete  PCP or Specialist appointment within 3-5 days of discharge Complete Complete Complete  HRI or Home Care Consult Complete Complete Complete  SW Recovery Care/Counseling Consult Complete Complete Complete  Palliative Care Screening Complete Complete Not [place] Not Applicable Not Applicable Not Applicable

## 2022-02-20 NOTE — Progress Notes (Signed)
[place] [facility] [place] NAME: Gary [name]    MR#:  [id number]  DATE OF BIRTH:  Jun 09, 1955  SUBJECTIVE:  No new complaints patient declined PT yesterday and OT today.  VITALS:  Blood pressure (!) 118/95, pulse 81, temperature 98.5 F (36.9 C), temperature source Oral, resp. rate 12, height '6\' 3"'$  (1.905 m), weight (!) 190.9 kg, SpO2 94 %.  PHYSICAL EXAMINATION:   GENERAL:  66 y.o.-year-old patient lying in the bed with no acute distress. Morbid obesity Trach collar + LUNGS: decreased  breath sounds bilaterally, no wheezing CARDIOVASCULAR: S1, S2 normal. No murmurs,   ABDOMEN: Soft, nontender, nondistended. Bowel sounds present.  EXTREMITIES: ++ edema b/l.    NEUROLOGIC: nonfocal  patient is alert and awake SKIN: No obvious rash, lesion, or ulcer.   LABORATORY PANEL:  CBC Recent Labs  Lab 02/14/22 1454  WBC 7.8  HGB 10.3*  HCT 37.5*  PLT 225     Chemistries  Recent Labs  Lab 02/14/22 1454 02/16/22 0913 02/20/22 0857  NA 138   < > 139  K 4.6   < > 3.7  CL 102   < > 103  CO2 31   < > 29  GLUCOSE 113*   < > 118*  BUN 34*   < > 36*  CREATININE 1.25*   < > 1.45*  CALCIUM 8.7*   < > 9.1  MG 2.1  --   --    < > = values in this interval not displayed.      Gary [name] is a 66 y.o. male with medical history significant for diastolic CHF, chronic respiratory failure status post tracheostomy, COPD, essential hypertension, PUD and sleep apnea who presented to the emergency room with acute onset of worsening dyspnea.     Apparently patient had been in the ER for 10 days as he was unable to be cared at home specially requirement of tracheostomy care, awaiting placement. Unfortunately he declined during his ER stay and in spite of being on torsemide developed worsening dyspnea and peripheral edema.  Repeat chest x-ray with worsening pulmonary edema.  Pt was admitted for management of acute on chronic diastolic heart failure.    Assessment & Plan:  Acute on chronic diastolic (congestive) heart failure  Patient to have some peripheral edema and concern of pulmonary vascular congestion on chest x-ray.  BNP was actually wnl. --cont torsemide   Acute on chronic respiratory failure with hypoxia  - Currently stable.  At baseline requires intermittent vent support with trach collar at night time--pt using his own machine --Pt wants to remove trach, however, per pulm Dr. Lanney Gins, "pt continues to need ventilator and has no family that is willing to help him. If we take it out, there may be tracheomalacia from scarring and next time he comes to be intubated it will be complicated he may code and die. So for now he needs to get better and get out of hospital then we can assess once in chronic stable state" as outpatient.   AKI (acute kidney injury) --Cr peaked at 1.51, baseline around 1.2.  Back to baseline.   Chronic obstructive pulmonary disease (COPD) ([name]) - Continue bronchodilators and home inhalers   GERD without esophagitis - cont PPI   Obesity, Class III, BMI 40-49.9 (morbid obesity) ([place]) Estimated body mass index is 48.12 kg/m as calculated from the following:   Height as of this encounter: '6\' 3"'$  (1.905 m).   Weight  as of this encounter: 174.6 kg.    Anxiety and depression -continue Lexapro and Klonopin as well as Seroquel.   Dyslipidemia --continue statin therapy.   Microcytic iron def anemia --cont iron supplement     DVT prophylaxis: NV:BTYOMAY Code Status: Full code  Family Communication:  Level of care: Stepdown Dispo:   The patient is from: home Anticipated d/c is to: SNF Anticipated d/c date is: whenever bed available         TOTAL TIME TAKING CARE OF THIS PATIENT: 25 minutes.  >50% time spent on counselling and coordination of care  Note: This dictation was prepared with Dragon dictation along with smaller phrase technology. Any transcriptional errors that result from this  process are unintentional.  Fritzi Mandes M.D    [facility]   CC: Primary care physician; Center, [facility]

## 2022-02-20 NOTE — Progress Notes (Addendum)
PT Cancellation Note  Patient Details Name: Gary [name] MRN: [id number] DOB: [birth year]   Cancelled Treatment:    Reason Eval/Treat Not Completed: Other (comment) Attempted to see pt this afternoon.   Pt recognized this PT from prior treatments/admits and was very pleasant.  However when encouraged to do some mobility he reports that he has not heard from his wife for ~5 days and is feeling anxious (apparently police or similar were going to be checking in on his home).  He states that he can't work with PT right now states "I want to do something, and I promise I will once I hear from her but not today; my mind is f-----d up right now."  He does endorse doing some of his HEP today but admits he has been distracted.  Will continue to follow and encourage as much activity as he can tolerate.  Offers to possibly just do supine exercises if he was not up for mobility/standing fell on deaf ears, he pleasantly declined again due to how he was feeling re: concerns about family situation.   Kreg Shropshire, DPT 02/20/2022, 4:20 PM

## 2022-02-21 ENCOUNTER — Inpatient Hospital Stay: Payer: Medicaid Other

## 2022-02-21 DIAGNOSIS — I5033 Acute on chronic diastolic (congestive) heart failure: Secondary | ICD-10-CM | POA: Diagnosis not present
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
## 2022-02-21 NOTE — Progress Notes (Signed)
PROGRESS NOTE    Gary [name]  CVE:[id number] DOB: Oct 29, 1955 DOA: 01/23/2022 PCP: [facility]  [id number]/[id number]  LOS: 18 days   Brief hospital course:   Assessment & Plan: Gary [name] is a 66 y.o. male with medical history significant for diastolic CHF, chronic respiratory failure status post tracheostomy, COPD, essential hypertension, PUD and sleep apnea who presented to the emergency room with acute onset of worsening dyspnea.     Apparently patient had been in the ER for 10 days as he was unable to be cared at home specially requirement of tracheostomy care, awaiting placement. Unfortunately he declined during his ER stay and in spite of being on torsemide developed worsening dyspnea and peripheral edema.  Repeat chest x-ray with worsening pulmonary edema.  Pt was admitted for management of acute on chronic diastolic heart failure.    Acute on chronic diastolic (congestive) heart failure  Patient to have some peripheral edema and concern of pulmonary vascular congestion on chest x-ray.  BNP was actually wnl. --cont torsemide   Acute on chronic respiratory failure with hypoxia  - Currently stable.  At baseline requires intermittent vent support with trach collar at night time--pt using his own machine --Pt wants to remove trach, however, per pulm Dr. Lanney Gins, "pt continues to need ventilator and has no family that is willing to help him. If we take it out, there may be tracheomalacia from scarring and next time he comes to be intubated it will be complicated he may code and die. So for now he needs to get better and get out of hospital then we can assess once in chronic stable state" as outpatient.   AKI (acute kidney injury) --Cr peaked at 1.51, baseline around 1.2.  Back to baseline.   Chronic obstructive pulmonary disease (COPD) ([name]) - Continue bronchodilators and home inhalers   GERD without esophagitis - cont PPI   Obesity, Class III, BMI 40-49.9  (morbid obesity) ([place]) Estimated body mass index is 48.12 kg/m as calculated from the following:   Height as of this encounter: '6\' 3"'$  (1.905 m).   Weight as of this encounter: 174.6 kg.    Anxiety and depression -continue Lexapro and Klonopin as well as Seroquel.   Dyslipidemia --continue statin therapy.   Microcytic iron def anemia --cont iron supplement    DVT prophylaxis: WC:HENIDPO Code Status: Full code  Family Communication: wife updated on speaker phone Level of care: Stepdown Dispo:   The patient is from: home Anticipated d/c is to: SNF Anticipated d/c date is: whenever bed available   Subjective and Interval History:  Pt reported having small amount of blood suctioned from his trach.     Objective: Vitals:   02/21/22 0615 02/21/22 0746 02/21/22 0931 02/21/22 1139  Pulse:  74 87 84  Resp:  (!) '22 17 18  '$ Temp:  98.7 F (37.1 C)    TempSrc:  Oral    SpO2: 96% 97% 97% 94%  Weight:      Height:        Intake/Output Summary (Last 24 hours) at 02/21/2022 1600 Last data filed at 02/21/2022 1500 Gross per 24 hour  Intake --  Output 2350 ml  Net -2350 ml   Filed Weights   02/19/22 0416 02/20/22 0500 02/21/22 0500  Weight: (!) 191 kg (!) 190.9 kg (!) 191.7 kg    Examination:   Constitutional: NAD, AAOx3 HEENT: conjunctivae and lids normal, EOMI CV: No cyanosis.   RESP: normal respiratory effort, with trach  Neuro: II - XII grossly intact.   Psych: Normal mood and affect.  Appropriate judgement and reason   Data Reviewed: I have personally reviewed labs and imaging studies  Time spent: 25 minutes  Enzo Bi, MD [facility] If 7PM-7AM, please contact night-coverage 02/21/2022, 4:00 PM

## 2022-02-21 NOTE — Evaluation (Signed)
Occupational Therapy Evaluation Patient Details Name: Gary [name] MRN: [id number] DOB: 1956-03-17 Today's Date: 02/21/2022   History of Present Illness Pt is a 66 y.o. 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  Prolonged admittances earlier this year for similar. MD assessment includes: Acute on chronic diastolic CHF, AKI, and acute on chronic respiratory failure with hypoxia.   Clinical Impression   Pt was seen for OT evaluation this date, seen with PT. Pt familiar from prior admission, requires assist for mobility. Pt presents to acute OT demonstrating impaired ADL performance and functional mobility 2/2 decreased activity tolerance and functional strength/ROM/balance deficits. Pt currently requires MAX A don B shoes at bed level. MIN A x2 + RW standing from max elevated bed height. Pt self-limiting mobility this session. Pt would benefit from skilled OT to address noted impairments and functional limitations (see below for any additional details). Upon hospital discharge, recommend STR to maximize pt safety and return to PLOF.     Recommendations for follow up therapy are one component of a multi-disciplinary discharge planning process, led by the attending physician.  Recommendations may be updated based on patient status, additional functional criteria and insurance authorization.   Follow Up Recommendations  Skilled nursing-short term rehab (<3 hours/day)    Assistance Recommended at Discharge Frequent or constant Supervision/Assistance  Patient can return home with the following Two people to help with walking and/or transfers;Two people to help with bathing/dressing/bathroom    Functional Status Assessment  Patient has had a recent decline in their functional status and demonstrates the ability to make significant improvements in function in a reasonable and  predictable amount of time.  Equipment Recommendations  Hospital bed    Recommendations for Other Services       Precautions / Restrictions Precautions Precautions: Fall Precaution Comments: cuffed trach/ trach collar Restrictions Weight Bearing Restrictions: No      Mobility Bed Mobility Overal bed mobility: Needs Assistance Bed Mobility: Supine to Sit, Sit to Supine     Supine to sit: Mod assist Sit to supine: Max assist, +2 for physical assistance        Transfers Overall transfer level: Needs assistance Equipment used: Rolling walker (2 wheels) Transfers: Sit to/from Stand Sit to Stand: Min assist, +2 physical assistance, From elevated surface                  Balance Overall balance assessment: Needs assistance Sitting-balance support: Feet supported Sitting balance-[name] Scale: Good     Standing balance support: Bilateral upper extremity supported, Reliant on assistive device for balance Standing balance-[name] Scale: Poor                             ADL either performed or assessed with clinical judgement   ADL Overall ADL's : Needs assistance/impaired                                       General ADL Comments: MAX A don B shoes at bed level. MIN A + RW standing from max elevated bed height      Pertinent Vitals/Pain Pain Assessment Pain Assessment: No/denies pain     Hand Dominance     Extremity/Trunk Assessment Upper Extremity Assessment Upper Extremity Assessment: Overall WFL for  tasks assessed   Lower Extremity Assessment Lower Extremity Assessment: Generalized weakness       Communication Communication Communication: Tracheostomy;Passy-Muir valve   Cognition Arousal/Alertness: Awake/alert Behavior During Therapy: WFL for tasks assessed/performed Overall Cognitive Status: Within Functional Limits for tasks assessed Area of Impairment: Safety/judgement, Problem solving                          Safety/Judgement: Decreased awareness of safety, Decreased awareness of deficits   Problem Solving: Requires verbal cues        Home Living Family/patient expects to be discharged to:: Private residence Living Arrangements: Other relatives Available Help at Discharge: Family;Available 24 hours/day   Home Access: Level entry;Ramped entrance     Home Layout: One level     Bathroom Shower/Tub: Teacher, early years/pre: Handicapped height     Home Equipment: Cane - single point;Wheelchair - IT trainer (2 wheels);Other (comment);Hospital bed          Prior Functioning/Environment Prior Level of Function : Needs assist                        OT Problem List: Decreased strength;Decreased activity tolerance;Decreased range of motion;Impaired balance (sitting and/or standing);Decreased safety awareness      OT Treatment/Interventions: Self-care/ADL training;Therapeutic exercise;Energy conservation;DME and/or AE instruction;Therapeutic activities;Patient/family education;Balance training    OT Goals(Current goals can be found in the care plan section) Acute Rehab OT Goals Patient Stated Goal: to go home OT Goal Formulation: With patient Time For Goal Achievement: 03/07/22 Potential to Achieve Goals: Fair ADL Goals Pt Will Perform Grooming: with modified independence;sitting Pt Will Perform Lower Body Dressing: sitting/lateral leans;with min assist Pt Will Transfer to Toilet: with min assist;bedside commode;stand pivot transfer  OT Frequency: Min 2X/week    Co-evaluation PT/OT/SLP Co-Evaluation/Treatment: Yes Reason for Co-Treatment: For patient/therapist safety;To address functional/ADL transfers PT goals addressed during session: Mobility/safety with mobility OT goals addressed during session: ADL's and self-care      AM-PAC OT "6 Clicks" Daily Activity     Outcome Measure Help from another person eating meals?: None Help  from another person taking care of personal grooming?: A Little Help from another person toileting, which includes using toliet, bedpan, or urinal?: A Lot Help from another person bathing (including washing, rinsing, drying)?: A Lot Help from another person to put on and taking off regular upper body clothing?: A Little Help from another person to put on and taking off regular lower body clothing?: A Lot 6 Click Score: 16   End of Session Equipment Utilized During Treatment: Rolling walker (2 wheels)  Activity Tolerance: Patient tolerated treatment well Patient left: in bed;with call bell/phone within reach  OT Visit Diagnosis: Other abnormalities of gait and mobility (R26.89);Muscle weakness (generalized) (M62.81)                Time: 1439-1510 OT Time Calculation (min): 31 min Charges:  OT General Charges $OT Visit: 1 Visit OT Evaluation $OT Eval Moderate Complexity: 1 Mod  [name], M.S. OTR/L  02/21/22, 4:32 PM  ascom [phone]

## 2022-02-21 NOTE — Progress Notes (Signed)
Physical Therapy Treatment Patient Details Name: Gary [name] MRN: [id number] DOB: [birth year] Today's Date: 02/21/2022   History of Present Illness Pt is a 66 y.o. 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  Prolonged admittances earlier this year for similar. MD assessment includes: Acute on chronic diastolic CHF, AKI, and acute on chronic respiratory failure with hypoxia.    PT Comments    Pt was long sitting in bed with supportive nephew present. Pt agrees to PT/OT co-treat. Has not been OOB in several days. He agrees to trial transfers but remains limited by fear/anxiety/self limiting. +2 assistance to stand 2 x from very elevated bed height. Pt demonstrated better standing tolerance and safety previous week. Continue to recommend transfers via mechanical lift. Acute PT will continue to progress pt as able per pt tolerance. Reviewed importance of pt doing exercises everyday and to work with whoever (therapist wise) that tries to treat him. " I'll do better tomorrow."   Recommendations for follow up therapy are one component of a multi-disciplinary discharge planning process, led by the attending physician.  Recommendations may be updated based on patient status, additional functional criteria and insurance authorization.  Follow Up Recommendations  Skilled nursing-short term rehab (<3 hours/day) Can patient physically be transported by private vehicle: No   Assistance Recommended at Discharge Frequent or constant Supervision/Assistance  Patient can return home with the following Two people to help with walking and/or transfers;Two people to help with bathing/dressing/bathroom;Assistance with cooking/housework;Assist for transportation;Help with stairs or ramp for entrance   Equipment Recommendations  None recommended by PT       Precautions / Restrictions  Precautions Precautions: Fall Precaution Comments: cuffed trach/ trach collar (5 L o2 trach collar) Restrictions Weight Bearing Restrictions: No     Mobility  Bed Mobility Overal bed mobility: Needs Assistance Bed Mobility: Supine to Sit, Sit to Supine Rolling: Max assist Supine to sit: Mod assist Sit to supine: Max assist, +2 for physical assistance     Transfers Overall transfer level: Needs assistance Equipment used: Rolling walker (2 wheels) Transfers: Sit to/from Stand Sit to Stand: Min assist, +2 physical assistance, From elevated surface      General transfer comment: Very elevated bed height. Pt self limiting due to fear/strength. " I will do better tomorrow." Discussed at length needing to try transfers to build confidence however pt remained fearful/timid    Ambulation/Gait    General Gait Details: currently unable    Balance Overall balance assessment: Needs assistance Sitting-balance support: Feet supported Sitting balance-[name] Scale: Good     Standing balance support: Bilateral upper extremity supported, Reliant on assistive device for balance Standing balance-[name] Scale: Poor Standing balance comment: limited tolerance/ posterior pusher       Cognition Arousal/Alertness: Awake/alert Behavior During Therapy: Anxious (Fearful of falling) Overall Cognitive Status: Within Functional Limits for tasks assessed Area of Impairment: Safety/judgement, Problem solving      Safety/Judgement: Decreased awareness of safety, Decreased awareness of deficits   Problem Solving: Requires verbal cues General Comments: pt continues to be weak however more so limited by fear of falling/anxiety. Pt was willing to participate but struggles due to fear           General Comments General comments (skin integrity, edema, etc.): reviewed importance of performing bed level exercises to promote strengthening. discussed need to work with all therapist when they come to work  with him.      Pertinent Vitals/Pain Pain Assessment Pain Assessment: 0-10    Home Living Family/patient expects to be discharged to:: Private residence Living Arrangements: Other relatives Available Help at Discharge: Family;Available 24 hours/day   Home Access: Level entry;Ramped entrance       Home Layout: One level Home Equipment: Cane - single point;Wheelchair - IT trainer (2 wheels);Other (comment);Hospital bed          PT Goals (current goals can now be found in the care plan section) Acute Rehab PT Goals Patient Stated Goal: to be able to walk Progress towards PT goals: Not progressing toward goals - comment (self limiting/fear of falling limiting)    Frequency    Min 2X/week      PT Plan Current plan remains appropriate    Co-evaluation   Reason for Co-Treatment: Complexity of the patient's impairments (multi-system involvement);Necessary to address cognition/behavior during functional activity;For patient/therapist safety;To address functional/ADL transfers PT goals addressed during session: Mobility/safety with mobility;Balance;Proper use of DME;Strengthening/ROM OT goals addressed during session: ADL's and self-care      AM-PAC PT "6 Clicks" Mobility   Outcome Measure  Help needed turning from your back to your side while in a flat bed without using bedrails?: A Lot Help needed moving from lying on your back to sitting on the side of a flat bed without using bedrails?: A Lot Help needed moving to and from a bed to a chair (including a wheelchair)?: Total Help needed standing up from a chair using your arms (e.g., wheelchair or bedside chair)?: Total Help needed to walk in hospital room?: Total Help needed climbing 3-5 steps with a railing? : Total 6 Click Score: 8    End of Session   Activity Tolerance: Patient tolerated treatment well;Other (comment) (fear of falling/ self limiting) Patient left: in bed;with call bell/phone  within reach Nurse Communication: Mobility status PT Visit Diagnosis: Unsteadiness on feet (R26.81);Muscle weakness (generalized) (M62.81);Difficulty in walking, not elsewhere classified (R26.2)     Time: 1431-1510 PT Time Calculation (min) (ACUTE ONLY): 39 min  Charges:  $Therapeutic Activity: 23-37 mins                    Julaine Fusi PTA 02/21/22, 6:00 PM

## 2022-02-22 ENCOUNTER — Inpatient Hospital Stay: Payer: Medicaid Other

## 2022-02-22 DIAGNOSIS — I5033 Acute on chronic diastolic (congestive) heart failure: Secondary | ICD-10-CM | POA: Diagnosis not present

## 2022-02-22 MED ORDER — GADOBUTROL 1 MMOL/ML IV SOLN
10.0000 mL | Freq: Once | INTRAVENOUS | Status: AC | PRN
  Administered 2022-02-22: 10 mL via INTRAVENOUS
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
## 2022-02-22 NOTE — Progress Notes (Signed)
PT Cancellation Note  Patient Details Name: Gary [name] MRN: [id number] DOB: 1955/05/26   Cancelled Treatment:     PT attempt. Pt c/o severe knee pain. Discussed with MD. X ray did not show any acute concerns/fxs/abnormalities but planning for MRI to rule out further. Pt requested to hold PT until after MRI is performed. Will return tomorrow and continue to follow per current POC.   [name] 02/22/2022, 2:27 PM

## 2022-02-22 NOTE — Progress Notes (Signed)
Pt has refused oral care with me during my shift... also refused his inhaler tx and diclofenac cream and lotion .... education given

## 2022-02-22 NOTE — Progress Notes (Signed)
PROGRESS NOTE    Gary [name]  WNI:[id number] DOB: 08/02/1955 DOA: 01/23/2022 PCP: [facility]  [id number]/[id number]  LOS: 19 days   Brief hospital course:   Assessment & Plan: Gary [name] is a 66 y.o. male with medical history significant for diastolic CHF, chronic respiratory failure status post tracheostomy, COPD, essential hypertension, PUD and sleep apnea who presented to the emergency room with acute onset of worsening dyspnea.     Apparently patient had been in the ER for 10 days as he was unable to be cared at home specially requirement of tracheostomy care, awaiting placement. Unfortunately he declined during his ER stay and in spite of being on torsemide developed worsening dyspnea and peripheral edema.  Repeat chest x-ray with worsening pulmonary edema.  Pt was admitted for management of acute on chronic diastolic heart failure.    Acute on chronic diastolic (congestive) heart failure  Patient to have some peripheral edema and concern of pulmonary vascular congestion on chest x-ray.  BNP was actually wnl. --cont torsemide   Acute on chronic respiratory failure with hypoxia  - Currently stable.  At baseline requires intermittent vent support with trach collar at night time--pt using his own machine at home. --Pt wants to remove trach, however, per pulm Dr. Lanney [name], "pt continues to need ventilator and has no family that is willing to help him. If we take it out, there may be tracheomalacia from scarring and next time he comes to be intubated it will be complicated he may code and die. So for now he needs to get better and get out of hospital then we can assess once in chronic stable state" as outpatient.   AKI (acute kidney injury) --Cr peaked at 1.51, baseline around 1.2.  Back to baseline.   Chronic obstructive pulmonary disease (COPD) ([name]) - Continue bronchodilators and home inhalers   GERD without esophagitis - cont PPI   Obesity, Class III, BMI  40-49.9 (morbid obesity) ([place]) Estimated body mass index is 48.12 kg/m as calculated from the following:   Height as of this encounter: '6\' 3"'$  (1.905 m).   Weight as of this encounter: 174.6 kg.    Anxiety and depression -continue Lexapro and Klonopin as well as Seroquel.   Dyslipidemia --continue statin therapy.   Microcytic iron def anemia --cont iron supplement  Right knee pain --xray no acute finding --MRI right knee    DVT prophylaxis: FG:[id number] Code Status: Full code  Family Communication:  Level of care: Stepdown Dispo:   The patient is from: home Anticipated d/c is to: SNF Anticipated d/c date is: whenever bed available   Subjective and Interval History:  Pt complained of right knee pain with certain movements.  Xray showed no acute finding.   Objective: Vitals:   02/22/22 0744 02/22/22 0755 02/22/22 0800 02/22/22 1200  BP: 122/86 122/86    Pulse: 78 78 80 83  Resp: 19 (!) 21 (!) 21 17  Temp: 99.1 F (37.3 C)   98.8 F (37.1 C)  TempSrc: Oral   Oral  SpO2: 96% 96% 97% 94%  Weight:      Height:        Intake/Output Summary (Last 24 hours) at 02/22/2022 1630 Last data filed at 02/22/2022 0600 Gross per 24 hour  Intake --  Output 1500 ml  Net -1500 ml   Filed Weights   02/19/22 0416 02/20/22 0500 02/21/22 0500  Weight: (!) 191 kg (!) 190.9 kg (!) 191.7 kg    Examination:  Constitutional: NAD, AAOx3 HEENT: conjunctivae and lids normal, EOMI CV: No cyanosis.   RESP: trach present Neuro: II - XII grossly intact.   Psych: Normal mood and affect.  Appropriate judgement and reason    Data Reviewed: I have personally reviewed labs and imaging studies  Time spent: 35 minutes  Gary Bi, MD [facility] If 7PM-7AM, please contact night-coverage 02/22/2022, 4:30 PM

## 2022-02-23 DIAGNOSIS — I5033 Acute on chronic diastolic (congestive) heart failure: Secondary | ICD-10-CM | POA: Diagnosis not present

## 2022-02-23 LAB — CBC
HCT: 35.9 % — ABNORMAL LOW (ref 39.0–52.0)
Hemoglobin: 10 g/dL — ABNORMAL LOW (ref 13.0–17.0)
MCH: 21.1 pg — ABNORMAL LOW (ref 26.0–34.0)
MCHC: 27.9 g/dL — ABNORMAL LOW (ref 30.0–36.0)
MCV: 75.7 fL — ABNORMAL LOW (ref 80.0–100.0)
Platelets: 195 10*3/uL (ref 150–400)
RBC: 4.74 MIL/uL (ref 4.22–5.81)
RDW: 17.8 % — ABNORMAL HIGH (ref 11.5–15.5)
WBC: 9 10*3/uL (ref 4.0–10.5)
nRBC: 0 % (ref 0.0–0.2)

## 2022-02-23 LAB — BASIC METABOLIC PANEL
Anion gap: 7 (ref 5–15)
BUN: 36 mg/dL — ABNORMAL HIGH (ref 8–23)
CO2: 30 mmol/L (ref 22–32)
Calcium: 9.1 mg/dL (ref 8.9–10.3)
Chloride: 105 mmol/L (ref 98–111)
Creatinine, Ser: 1.44 mg/dL — ABNORMAL HIGH (ref 0.61–1.24)
GFR, Estimated: 54 mL/min — ABNORMAL LOW (ref >60)
Glucose, Bld: 99 mg/dL (ref 70–99)
Potassium: 3.8 mmol/L (ref 3.5–5.1)
Sodium: 142 mmol/L (ref 135–145)

## 2022-02-23 LAB — MAGNESIUM: Magnesium: 2.1 mg/dL (ref 1.7–2.4)

## 2022-02-23 MED ORDER — TRIAMCINOLONE ACETONIDE 40 MG/ML IJ SUSP
80.0000 mg | Freq: Once | INTRAMUSCULAR | Status: AC
  Administered 2022-02-23: 80 mg via INTRA_ARTICULAR
  Filled 2022-02-23: qty 2

## 2022-02-23 MED ORDER — BUPIVACAINE HCL (PF) 0.5 % IJ SOLN
10.0000 mL | Freq: Once | INTRAMUSCULAR | Status: AC
  Administered 2022-02-23: 10 mL via INTRA_ARTICULAR
  Filled 2022-02-23: qty 10
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
## 2022-02-23 NOTE — Progress Notes (Signed)
PROGRESS NOTE    Gary [name]  TFT:[id number] DOB: 1956/03/27 DOA: 01/23/2022 PCP: [facility]  [id number]  LOS: 20 days   Brief hospital course:   Assessment & Plan: Gary [name] is a 66 y.o. male with medical history significant for diastolic CHF, chronic respiratory failure status post tracheostomy tube placement 09/2021, advanced COPD, essential hypertension, PUD, sleep apnea, multiple hospitalizations who presented to the emergency room with acute onset of worsening dyspnea.     Apparently patient had been in the ER for 10 days as he was unable to be cared at home specially requirement of tracheostomy care, awaiting placement. Unfortunately he declined during his ER stay and in spite of being on torsemide developed worsening dyspnea and peripheral edema.  Repeat chest x-ray with worsening pulmonary edema.  Pt was admitted for management of acute on chronic diastolic heart failure.    Acute on chronic diastolic (congestive) heart failure  Patient to have some peripheral edema and concern of pulmonary vascular congestion on chest x-ray.  BNP was actually wnl. --cont torsemide   Acute on chronic respiratory failure with hypoxemia and hypercapnia   Advanced COPD Tracheostomy tube dependence - Currently stable.  At baseline requires intermittent vent support with trach collar at night time--pt using his own machine at home. --Tracheostomy with inspissated mucus plugging  --Pt wants to remove trach, however, per pulm Dr. Lanney Gins, "pt continues to need ventilator and has no family that is willing to help him. If we take it out, there may be tracheomalacia from scarring and next time he comes to be intubated it will be complicated he may code and die. So for now he needs to get better and get out of hospital then we can assess once in chronic stable state" as outpatient. - Continue bronchodilators and home inhalers --ENT for trach exchange   AKI (acute kidney  injury) --Cr peaked at 1.51, baseline around 1.2.  Back to baseline.   GERD without esophagitis - cont PPI   Obesity, Class III, BMI 40-49.9 (morbid obesity) ([place]) Estimated body mass index is 48.12 kg/m as calculated from the following:   Height as of this encounter: '6\' 3"'$  (1.905 m).   Weight as of this encounter: 174.6 kg.    Anxiety and depression -continue Lexapro and Klonopin as well as Seroquel.   Dyslipidemia --continue statin therapy.   Microcytic iron def anemia --cont iron supplement  Right knee pain 2/2 Lateral and medial meniscus tears --per MRI right knee --ortho consult today, for possible injection to help with pain    DVT prophylaxis: HC:[id number] Code Status: Full code  Family Communication:  Level of care: Stepdown Dispo:   The patient is from: home Anticipated d/c is to: SNF Anticipated d/c date is: whenever bed available   Subjective and Interval History:  MRI right knee found likely meniscus tears.  Ortho consulted today for possible injection to help with pain.   Objective: Vitals:   02/23/22 0500 02/23/22 0600 02/23/22 0800 02/23/22 1200  BP:    111/75  Pulse: 76 76  84  Resp: 18 (!) 21  18  Temp:   98.4 F (36.9 C) 98.8 F (37.1 C)  TempSrc:   Oral Oral  SpO2: 97% 97%  93%  Weight:      Height:        Intake/Output Summary (Last 24 hours) at 02/23/2022 1551 Last data filed at 02/23/2022 0600 Gross per 24 hour  Intake --  Output 1200 ml  Net -  1200 ml   Filed Weights   02/19/22 0416 02/20/22 0500 02/21/22 0500  Weight: (!) 191 kg (!) 190.9 kg (!) 191.7 kg    Examination:   Constitutional: NAD, AAOx3 HEENT: conjunctivae and lids normal, EOMI CV: No cyanosis.   RESP: trach present Extremities: edema in BLE Neuro: II - XII grossly intact.   Psych: Normal mood and affect.     Data Reviewed: I have personally reviewed labs and imaging studies  Time spent: 35 minutes  Enzo Bi, MD [facility] If 7PM-7AM, please  contact night-coverage 02/23/2022, 3:51 PM

## 2022-02-23 NOTE — Progress Notes (Signed)
OT Cancellation Note  Patient Details Name: Gary [name] MRN: [id number] DOB: [birth year]   Cancelled Treatment:    Reason Eval/Treat Not Completed: Patient declined, no reason specified. Chart reviewed. Per conversation with PT, pt wants to defer mobility attempts until results of MRI are discussed. Will hold off at this time and reattempt as able.   [name], M.S. OTR/L  02/23/22, 2:27 PM  ascom [phone]

## 2022-02-23 NOTE — Progress Notes (Signed)
PT Cancellation Note  Patient Details Name: Gary [name] MRN: [id number] DOB: Aug 24, 1955   Cancelled Treatment:     PT attempt. Pt refused." I want to wait to talk to bone doctor about my knee first." Author did best I could to educate pt on what meniscus is and what he can safely do. Pt remains resistive and requested to wait to do his PT till after he talks to MD. PT will continue to follow per current POC. Encouraged pt to continue to perform previously prescribed HEP/exercises.     [name] 02/23/2022, 2:32 PM
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
## 2022-02-24 DIAGNOSIS — I5033 Acute on chronic diastolic (congestive) heart failure: Secondary | ICD-10-CM | POA: Diagnosis not present

## 2022-02-24 LAB — IRON AND TIBC
Iron: 38 ug/dL — ABNORMAL LOW (ref 45–182)
Saturation Ratios: 13 % — ABNORMAL LOW (ref 17.9–39.5)
TIBC: 302 ug/dL (ref 250–450)
UIBC: 264 ug/dL

## 2022-02-24 MED ORDER — PANTOPRAZOLE SODIUM 40 MG PO TBEC
40.0000 mg | DELAYED_RELEASE_TABLET | Freq: Every day | ORAL | Status: DC
  Administered 2022-02-25 – 2022-03-29 (×32): 40 mg via ORAL
  Filled 2022-02-24 (×35): qty 1

## 2022-02-24 MED ORDER — SODIUM CHLORIDE 0.9 % IV SOLN
200.0000 mg | Freq: Every day | INTRAVENOUS | Status: AC
  Administered 2022-02-24 – 2022-02-26 (×3): 200 mg via INTRAVENOUS
  Filled 2022-02-24 (×3): qty 200
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
## 2022-02-24 NOTE — Progress Notes (Signed)
PROGRESS NOTE    Gary [name]  DUK:[id number] DOB: 20-Jan-1956 DOA: 01/23/2022 PCP: [facility] [place]  [id number]  LOS: 21 days   Brief hospital course:   Assessment & Plan: Gary [name] is a 66 y.o. male with medical history significant for diastolic CHF, chronic respiratory failure status post tracheostomy tube placement 09/2021, advanced COPD, essential hypertension, PUD, sleep apnea, multiple hospitalizations who presented to the emergency room with acute onset of worsening dyspnea.     Apparently patient had been in the ER for 10 days as he was unable to be cared at home specially requirement of tracheostomy care, awaiting placement. Unfortunately he declined during his ER stay and in spite of being on torsemide developed worsening dyspnea and peripheral edema.  Repeat chest x-ray with worsening pulmonary edema.  Pt was admitted for management of acute on chronic diastolic heart failure.    Acute on chronic diastolic (congestive) heart failure  Patient to have some peripheral edema and concern of pulmonary vascular congestion on chest x-ray.  BNP was actually wnl. --cont torsemide   Acute on chronic respiratory failure with hypoxemia and hypercapnia   Advanced COPD Tracheostomy tube dependence - Currently stable.  At baseline requires intermittent vent support with trach collar at night time--pt using his own machine at home. --Tracheostomy with inspissated mucus plugging  --Pt wants to remove trach, however, per pulm Dr. Lanney Gins, "pt continues to need ventilator and has no family that is willing to help him. If we take it out, there may be tracheomalacia from scarring and next time he comes to be intubated it will be complicated he may code and die. So for now he needs to get better and get out of hospital then we can assess once in chronic stable state" as outpatient. - Continue bronchodilators and home inhalers --ENT for trach exchange on Monday   AKI  (acute kidney injury) --Cr peaked at 1.51, baseline around 1.2.  Back to baseline.   GERD without esophagitis - cont PPI   Obesity, Class III, BMI 40-49.9 (morbid obesity) (Gary [name]) Estimated body mass index is 48.12 kg/m as calculated from the following:   Height as of this encounter: '6\' 3"'$  (1.905 m).   Weight as of this encounter: 174.6 kg.    Anxiety and depression -continue Lexapro and Klonopin as well as Seroquel.   Dyslipidemia --continue statin therapy.   Microcytic Iron def anemia --cont iron supplement --start IV iron x3 doses  Right knee pain 2/2 Lateral and medial meniscus tears --per MRI right knee.   --ortho consulted, most likely require a total knee replacement to address both the meniscal pathology and the degenerative changes, however, pt currently is not a surgical candidate. --s/p Kenalog injection on 11/10 for symptom management.  Hypokalemia --cont potassium 10 mEq daily    DVT prophylaxis: [id number] Code Status: Full code  Family Communication:  Level of care: Stepdown Dispo:   The patient is from: home Anticipated d/c is to: SNF Anticipated d/c date is: whenever bed available   Subjective and Interval History:  Pt received Kenalog to his right knee yesterday.     Objective: Vitals:   02/24/22 0800 02/24/22 0900 02/24/22 1000 02/24/22 1100  BP:      Pulse: 74 79 80 78  Resp: 18 (!) 24 20 (!) 23  Temp:      TempSrc:      SpO2: 94% 92% 96% 92%  Weight:      Height:  Intake/Output Summary (Last 24 hours) at 02/24/2022 1407 Last data filed at 02/24/2022 1000 Gross per 24 hour  Intake 240 ml  Output 1450 ml  Net -1210 ml   Filed Weights   02/19/22 0416 02/20/22 0500 02/21/22 0500  Weight: (!) 191 kg (!) 190.9 kg (!) 191.7 kg    Examination:   Constitutional: NAD, AAOx3 HEENT: conjunctivae and lids normal, EOMI CV: No cyanosis.   RESP: trach present Neuro: II - XII grossly intact.   Psych: Normal mood and affect.   Appropriate judgement and reason   Data Reviewed: I have personally reviewed labs and imaging studies  Time spent: 35 minutes  Enzo Bi, MD [facility] If 7PM-7AM, please contact night-coverage 02/24/2022, 2:07 PM

## 2022-02-24 NOTE — Consult Note (Signed)
Subjective: The patient feels that his knee is feeling better today following the injection yesterday.  He feels that he is able to move it more freely and with less pain.  He denies any new complaints pertaining to his right knee.   Objective: Vital signs in last 24 hours: Temp:  [98.7 F (37.1 C)-99 F (37.2 C)] 98.9 F (37.2 C) (11/11 1444) Pulse Rate:  [66-86] 78 (11/11 1444) Resp:  [15-29] 23 (11/11 1444) BP: (113-130)/(35-95) 123/95 (11/11 1444) SpO2:  [90 %-98 %] 94 % (11/11 1444) FiO2 (%):  [28 %] 28 % (11/10 2050)  Intake/Output from previous day: 11/10 0701 - 11/11 0700 In: -  Out: 2450 [Urine:2450] Intake/Output this shift: Total I/O In: 600 [P.O.:600] Out: 800 [Urine:800]  Recent Labs    02/23/22 0429  HGB 10.0*   Recent Labs    02/23/22 0429  WBC 9.0  RBC 4.74  HCT 35.9*  PLT 195   Recent Labs    02/23/22 0429  NA 142  K 3.8  CL 105  CO2 30  BUN 36*  CREATININE 1.44*  GLUCOSE 99  CALCIUM 9.1   No results for input(s): "LABPT", "INR" in the last 72 hours.  Physical Exam: Orthopedic examination again is limited to the right knee and lower extremity.  Skin inspection around the right knee is unremarkable.  There perhaps is at most a trace effusion and minimal residual swelling, but no erythema, ecchymosis, abrasions, or other skin abnormalities are identified.  He has only minimal tenderness to palpation along the medial and lateral joint lines.  Actively, he is able to flex and extend his knee freely from 20 to 70 degrees without any pain.  He still is not yet able to perform straight leg raise, but stated that his been a long time since he has been able to do that.  He is able to dorsiflex and plantarflex his toes and ankle, and has intact sensation to light touch to his toes.  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  Plan: The patient may be mobilized  with physical therapy as symptoms permit, weightbearing as tolerated on the right leg.  If his leg is too weak to allow him to weight-bear fully, a knee immobilizer may be applied to help support him.   Gary [name] 02/24/2022, 4:06 PM

## 2022-02-25 DIAGNOSIS — I5033 Acute on chronic diastolic (congestive) heart failure: Secondary | ICD-10-CM | POA: Diagnosis not present

## 2022-02-25 LAB — BASIC METABOLIC PANEL
Anion gap: 7 (ref 5–15)
BUN: 46 mg/dL — ABNORMAL HIGH (ref 8–23)
CO2: 27 mmol/L (ref 22–32)
Calcium: 8.8 mg/dL — ABNORMAL LOW (ref 8.9–10.3)
Chloride: 106 mmol/L (ref 98–111)
Creatinine, Ser: 1.3 mg/dL — ABNORMAL HIGH (ref 0.61–1.24)
GFR, Estimated: >60 mL/min (ref >60)
Glucose, Bld: 170 mg/dL — ABNORMAL HIGH (ref 70–99)
Potassium: 4 mmol/L (ref 3.5–5.1)
Sodium: 140 mmol/L (ref 135–145)

## 2022-02-25 NOTE — Progress Notes (Signed)
PROGRESS NOTE    Gary [name]  UDJ:[id number] DOB: 12-12-1955 DOA: 01/23/2022 PCP: [facility] [place]  [id number]/IC05A-[id number]  LOS: 22 days   Brief hospital course:   Assessment & Plan: Gary [name] is a 66 y.o. male with medical history significant for diastolic CHF, chronic respiratory failure status post tracheostomy tube placement 09/2021, advanced COPD, essential hypertension, PUD, sleep apnea, multiple hospitalizations who presented to the emergency room with acute onset of worsening dyspnea.     Apparently patient had been in the ER for 10 days as he was unable to be cared at home specially requirement of tracheostomy care, awaiting placement. Unfortunately he declined during his ER stay and in spite of being on torsemide developed worsening dyspnea and peripheral edema.  Repeat chest x-ray with worsening pulmonary edema.  Pt was admitted for management of acute on chronic diastolic heart failure.    Acute on chronic diastolic (congestive) heart failure  Patient to have some peripheral edema and concern of pulmonary vascular congestion on chest x-ray.  BNP was actually wnl. --cont torsemide   Acute on chronic respiratory failure with hypoxemia and hypercapnia   Advanced COPD Tracheostomy tube dependence - Currently stable.  At baseline requires intermittent vent support with trach collar at night time--pt using his own machine at home. --Tracheostomy with inspissated mucus plugging  --Pt wants to remove trach, however, per pulm Dr. Lanney Gins, "pt continues to need ventilator and has no family that is willing to help him. If we take it out, there may be tracheomalacia from scarring and next time he comes to be intubated it will be complicated he may code and die. So for now he needs to get better and get out of hospital then we can assess once in chronic stable state" as outpatient. - Continue bronchodilators and home inhalers --ENT for trach exchange on Monday   AKI  (acute kidney injury) --Cr peaked at 1.51, baseline around 1.2.  Back to baseline.   GERD without esophagitis - cont PPI   Obesity, Class III, BMI 40-49.9 (morbid obesity) ([place]) Estimated body mass index is 48.12 kg/m as calculated from the following:   Height as of this encounter: '6\' 3"'$  (1.905 m).   Weight as of this encounter: 174.6 kg.    Anxiety and depression -continue Lexapro and Klonopin as well as Seroquel.   Dyslipidemia --continue statin therapy.   Microcytic Iron def anemia --cont iron supplement --start IV iron x3 doses  Right knee pain 2/2 Lateral and medial meniscus tears --per MRI right knee.   --ortho consulted, most likely require a total knee replacement to address both the meniscal pathology and the degenerative changes, however, pt currently is not a surgical candidate. --s/p Kenalog injection on 11/10 for symptom management.  Hypokalemia --cont potassium 10 mEq daily    DVT prophylaxis: HY:[id number] Code Status: Full code  Family Communication:  Level of care: Stepdown Dispo:   The patient is from: home Anticipated d/c is to: SNF Anticipated d/c date is: whenever bed available   Subjective and Interval History:  Pt declined PT this morning because it was too early.  Pt agreeable to afternoon session.   Objective: Vitals:   02/25/22 1300 02/25/22 1400 02/25/22 1425 02/25/22 1500  BP:   (!) 133/95   Pulse: 84 96 100 85  Resp: 16 (!) 22 (!) 24 (!) 25  Temp:      TempSrc:      SpO2: 95% 94% 95% 94%  Weight:  Height:        Intake/Output Summary (Last 24 hours) at 02/25/2022 1555 Last data filed at 02/25/2022 1430 Gross per 24 hour  Intake 826.83 ml  Output 3100 ml  Net -2273.17 ml   Filed Weights   02/20/22 0500 02/21/22 0500 02/25/22 0500  Weight: (!) 190.9 kg (!) 191.7 kg (!) 180.5 kg    Examination:   Constitutional: NAD, AAOx3 HEENT: conjunctivae and lids normal, EOMI CV: No cyanosis.   RESP: trach present Neuro: II -  XII grossly intact.   Psych: Normal mood and affect.     Data Reviewed: I have personally reviewed labs and imaging studies  Time spent: 25 minutes  Enzo Bi, MD  [facility] If 7PM-7AM, please contact night-coverage 02/25/2022, 3:55 PM

## 2022-02-25 NOTE — Progress Notes (Signed)
PT Cancellation Note  Patient Details Name: Gary [name] MRN: [id number] DOB: [birth year]   Cancelled Treatment:    Reason Eval/Treat Not Completed: Patient declined, no reason specified Attempted to see pt around 9AM with pt stating he doesn't ever do therapy this early & that he hasn't finished his "morning stuff". Will f/u as able.  Lavone Nian, PT, DPT 02/25/22, 9:34 AM   [name] 02/25/2022, 9:34 AM

## 2022-02-25 NOTE — Progress Notes (Signed)
Patient placed on home vent for the night. 2 liters bled in line and cuff left deflated per patient request, sats are 93%

## 2022-02-25 NOTE — Consult Note (Signed)
PHARMACY CONSULT NOTE - FOLLOW UP  Pharmacy Consult for Electrolyte Monitoring and Replacement   Recent Labs: Potassium (mmol/L)  Date Value  02/23/2022 3.8  04/15/2014 3.9   Magnesium (mg/dL)  Date Value  02/23/2022 2.1  08/21/2012 1.9   Calcium (mg/dL)  Date Value  02/23/2022 9.1   Calcium, Total (mg/dL)  Date Value  04/15/2014 8.3 (L)   Albumin (g/dL)  Date Value  01/23/2022 3.1 (L)  04/15/2014 2.9 (L)   Phosphorus (mg/dL)  Date Value  02/12/2022 3.1   Sodium (mmol/L)  Date Value  02/23/2022 142  03/15/2020 145 (H)  04/15/2014 140     Assessment: 66 y.o. male with medical history significant for diastolic CHF, chronic respiratory failure status post tracheostomy tube placement 09/2021, advanced COPD, essential hypertension, PUD, sleep apnea, multiple hospitalizations who presented to the emergency room with acute onset of worsening dyspnea.     On torsemide 20 mg daily.   Goal of Therapy:  WNL  Plan:  No replacement needed at this time.  F/u with AM labs.   Oswald Hillock ,PharmD Clinical Pharmacist 02/25/2022 8:11 AM;e

## 2022-02-25 NOTE — Progress Notes (Addendum)
Physical Therapy Treatment Patient Details Name: Gary [name] MRN: [id number] DOB: 05-30-1955 Today's Date: 02/25/2022   History of Present Illness Pt is a 66 y.o. 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  Prolonged admittances earlier this year for similar. MD assessment includes: Acute on chronic diastolic CHF, AKI, and acute on chronic respiratory failure with hypoxia.    PT Comments    Pt seen for PT tx with pt agreeable. Pt reports he has torn ligaments in his R knee & will need a knee replacement. PT attempted to educate pt that it's his meniscus that's injured but to no avail. Pt reports he feels better since receiving cortisone injection. Pt is agreeable to standing attempts but requests to "warm up" his legs first & engages in BLE exercises. Pt is able to complete sit>supine with supervision with heavy reliance on hospital bed features & extra time, sit>supine with mod assist +2 to elevate BLE onto bed. Pt requires prolonged rest break between each transition but is able to transfer STS x 3 times with min assist +2 from significantly elevated EOB. Pt demonstrates significantly limited standing tolerance 2/2 BLE weakness but is motivated due to standing 3 times. Pt eager to get into recliner to returned to bed & sling positioned. Used maxi sky hoyer lift to transfer pt bed>recliner with pt reporting it feels good to sit up. Encouraged pt to get OOB daily with nursing staff using hoyer lift; educated pt on benefits of OOB mobility & ability to perform BLE exercises (example: LAQs) while sitting in recliner. Pt left in care of nurse with call bell in lap.  Addendum: Reviewed POC & all goals remain appropriate at this time. Updated date for all PT goals.    Recommendations for follow up therapy are one component of a multi-disciplinary discharge planning process, led by the  attending physician.  Recommendations may be updated based on patient status, additional functional criteria and insurance authorization.  Follow Up Recommendations  Skilled nursing-short term rehab (<3 hours/day) Can patient physically be transported by private vehicle: No   Assistance Recommended at Discharge Frequent or constant Supervision/Assistance  Patient can return home with the following Two people to help with walking and/or transfers;Two people to help with bathing/dressing/bathroom;Assistance with cooking/housework;Assist for transportation;Help with stairs or ramp for entrance   Equipment Recommendations  None recommended by PT    Recommendations for Other Services       Precautions / Restrictions Precautions Precautions: Fall Precaution Comments: cuffed trach/ trach collar Restrictions Weight Bearing Restrictions: Yes RLE Weight Bearing: Weight bearing as tolerated     Mobility  Bed Mobility Overal bed mobility: Needs Assistance Bed Mobility: Rolling Rolling: Mod assist   Supine to sit: HOB elevated, Supervision (Pt is able to move BLE to EOB & upright trunk with HOB fully elevated & use of bed rails with extra time to complete movement.) Sit to supine: +2 for physical assistance, Mod assist (assistance to elevate BLE onto bed, HOB elevated, bed rails)        Transfers Overall transfer level: Needs assistance Equipment used: Rolling walker (2 wheels) Transfers: Sit to/from Stand Sit to Stand: Min assist, +2 physical assistance, +2 safety/equipment, From elevated surface           General transfer comment: Pt transferred STS from significantly elevated EOB with bariatric RW & min assist +2. PT provides light tactile cuing  at R knee but no buckling noted. Pt requires cuing "1-2-3" to initiate STS transfer but continues to pull up on RW with BUE. Pt with difficulty shifting weight anteriorly then uprighting trunk to come to full upright standing posture. Pt  stands <10 seconds each time.    Ambulation/Gait                   Stairs             Wheelchair Mobility    Modified Rankin (Stroke Patients Only)       Balance Overall balance assessment: Needs assistance Sitting-balance support: Bilateral upper extremity supported, Feet supported Sitting balance-[name] Scale: Good     Standing balance support: Bilateral upper extremity supported, Reliant on assistive device for balance Standing balance-[name] Scale: Poor                              Cognition Arousal/Alertness: Awake/alert Behavior During Therapy: Anxious (fearful of falling) Overall Cognitive Status: Within Functional Limits for tasks assessed                                 General Comments: Pt appreciative of PT efforts on this date, thanking staff a couple times at end of session. Appears motivated after session.        Exercises General Exercises - Lower Extremity Heel Slides: AAROM, AROM, Strengthening, Both, 20 reps (AAROM RLE) Hip ABduction/ADduction: AAROM, Strengthening, Right, 20 reps, Supine    General Comments        Pertinent Vitals/Pain Pain Assessment Pain Assessment: Faces Faces Pain Scale: Hurts a little bit Pain Location: R knee Pain Descriptors / Indicators: Discomfort Pain Intervention(s): Monitored during session    Home Living                          Prior Function            PT Goals (current goals can now be found in the care plan section) Acute Rehab PT Goals Patient Stated Goal: to be able to walk PT Goal Formulation: With patient Time For Goal Achievement: 03/11/22 Potential to Achieve Goals: Fair Progress towards PT goals: Progressing toward goals    Frequency    Min 2X/week      PT Plan Current plan remains appropriate    Co-evaluation              AM-PAC PT "6 Clicks" Mobility   Outcome Measure  Help needed turning from your back to your side while  in a flat bed without using bedrails?: A Lot Help needed moving from lying on your back to sitting on the side of a flat bed without using bedrails?: A Lot Help needed moving to and from a bed to a chair (including a wheelchair)?: Total Help needed standing up from a chair using your arms (e.g., wheelchair or bedside chair)?: Total Help needed to walk in hospital room?: Total Help needed climbing 3-5 steps with a railing? : Total 6 Click Score: 8    End of Session Equipment Utilized During Treatment: Oxygen (via trach collar) Activity Tolerance: Patient tolerated treatment well Patient left: in chair;with call bell/phone within reach;with nursing/sitter in room Nurse Communication: Mobility status PT Visit Diagnosis: Unsteadiness on feet (R26.81);Muscle weakness (generalized) (M62.81);Difficulty in walking, not elsewhere classified (R26.2)     Time: 4132-4401  PT Time Calculation (min) (ACUTE ONLY): 30 min  Charges:  $Therapeutic Activity: 23-37 mins                     Lavone Nian, PT, DPT 02/25/22, 2:30 PM  [name] 02/25/2022, 2:26 PM

## 2022-02-25 NOTE — Plan of Care (Signed)
  Problem: Education: Goal: Ability to demonstrate management of disease process will improve Outcome: Progressing Goal: Ability to verbalize understanding of medication therapies will improve Outcome: Progressing   Problem: Activity: Goal: Capacity to carry out activities will improve Outcome: Progressing   Problem: Cardiac: Goal: Ability to achieve and maintain adequate cardiopulmonary perfusion will improve Outcome: Progressing   Problem: Education: Goal: Knowledge of General Education information will improve Description: Including pain rating scale, medication(s)/side effects and non-pharmacologic comfort measures Outcome: Progressing   Problem: Health Behavior/Discharge Planning: Goal: Ability to manage health-related needs will improve Outcome: Progressing   Problem: Clinical Measurements: Goal: Ability to maintain clinical measurements within normal limits will improve Outcome: Progressing Goal: Will remain free from infection Outcome: Progressing Goal: Diagnostic test results will improve Outcome: Progressing Goal: Respiratory complications will improve Outcome: Progressing Goal: Cardiovascular complication will be avoided Outcome: Progressing   Problem: Activity: Goal: Risk for activity intolerance will decrease Outcome: Progressing   Problem: Nutrition: Goal: Adequate nutrition will be maintained Outcome: Progressing   Problem: Coping: Goal: Level of anxiety will decrease Outcome: Progressing   Problem: Elimination: Goal: Will not experience complications related to bowel motility Outcome: Progressing Goal: Will not experience complications related to urinary retention Outcome: Progressing   Problem: Pain Managment: Goal: General experience of comfort will improve Outcome: Progressing   Problem: Safety: Goal: Ability to remain free from injury will improve Outcome: Progressing   Problem: Skin Integrity: Goal: Risk for impaired skin integrity  will decrease Outcome: Progressing   Problem: Activity: Goal: Capacity to carry out activities will improve Outcome: Progressing   Problem: Cardiac: Goal: Ability to achieve and maintain adequate cardiopulmonary perfusion will improve Outcome: Progressing

## 2022-02-26 DIAGNOSIS — I5033 Acute on chronic diastolic (congestive) heart failure: Secondary | ICD-10-CM | POA: Diagnosis not present

## 2022-02-26 MED ORDER — SODIUM CHLORIDE 0.9 % IV SOLN: INTRAVENOUS | Status: DC | PRN

## 2022-02-26 NOTE — Progress Notes (Addendum)
Pt is irritable at this moment... Did not let me do a full assessment and would not let me shine a light into eyes to assess pupils.... sts "he has been in this mother fucking place for 6 months and nobody has done it and doesn't know why I keep doing it."  Pt also c/o left hand pain but will not let me assess.   Education given to pt and pt verb understanding.

## 2022-02-26 NOTE — Progress Notes (Signed)
Attempted to give pt his evening meds but wants me to leave them on the table and he will take it later on.... Education given on importance for nurse to witness him taking his medications and making sure he takes his klonopin as ordered.  Pt sts " he is not a baby" and he will take it and doesn't want to hear "the bullshit" from me.   Took the meds and placed them on his side so I wont take them back.

## 2022-02-26 NOTE — Consult Note (Signed)
[facility] for Electrolyte Monitoring and Replacement   Recent Labs: Potassium (mmol/L)  Date Value  02/25/2022 4.0  04/15/2014 3.9   Magnesium (mg/dL)  Date Value  02/23/2022 2.1  08/21/2012 1.9   Calcium (mg/dL)  Date Value  02/25/2022 8.8 (L)   Calcium, Total (mg/dL)  Date Value  04/15/2014 8.3 (L)   Albumin (g/dL)  Date Value  01/23/2022 3.1 (L)  04/15/2014 2.9 (L)   Phosphorus (mg/dL)  Date Value  02/12/2022 3.1   Sodium (mmol/L)  Date Value  02/25/2022 140  03/15/2020 145 (H)  04/15/2014 140     Assessment: 66 y.o. male with medical history significant for diastolic CHF, chronic respiratory failure status post tracheostomy tube placement 09/2021, advanced COPD, essential hypertension, PUD, sleep apnea, multiple hospitalizations who presented to the emergency room with acute onset of worsening dyspnea.     Diuretics: torsemide 20 mg daily.  --on oral KCl 10 mEq once daily  Goal of Therapy:  Electrolytes WNL  Plan:  ---continue oral KCl 10 mEq once daily  ---F/u with AM labs.   Dallie Piles ,PharmD Clinical Pharmacist 02/26/2022 7:03 AM;e

## 2022-02-26 NOTE — Progress Notes (Signed)
Pt finally agreed to take meds

## 2022-02-26 NOTE — Plan of Care (Signed)
  Problem: Education: Goal: Ability to demonstrate management of disease process will improve Outcome: Progressing Goal: Ability to verbalize understanding of medication therapies will improve Outcome: Progressing   Problem: Activity: Goal: Capacity to carry out activities will improve Outcome: Progressing   Problem: Cardiac: Goal: Ability to achieve and maintain adequate cardiopulmonary perfusion will improve Outcome: Progressing   Problem: Education: Goal: Knowledge of General Education information will improve Description: Including pain rating scale, medication(s)/side effects and non-pharmacologic comfort measures Outcome: Progressing   Problem: Health Behavior/Discharge Planning: Goal: Ability to manage health-related needs will improve Outcome: Progressing   Problem: Clinical Measurements: Goal: Ability to maintain clinical measurements within normal limits will improve Outcome: Progressing Goal: Will remain free from infection Outcome: Progressing Goal: Diagnostic test results will improve Outcome: Progressing Goal: Respiratory complications will improve Outcome: Progressing Goal: Cardiovascular complication will be avoided Outcome: Progressing   Problem: Activity: Goal: Risk for activity intolerance will decrease Outcome: Progressing   Problem: Nutrition: Goal: Adequate nutrition will be maintained Outcome: Progressing   Problem: Coping: Goal: Level of anxiety will decrease Outcome: Progressing   Problem: Elimination: Goal: Will not experience complications related to bowel motility Outcome: Progressing Goal: Will not experience complications related to urinary retention Outcome: Progressing   Problem: Pain Managment: Goal: General experience of comfort will improve Outcome: Progressing   Problem: Safety: Goal: Ability to remain free from injury will improve Outcome: Progressing   Problem: Skin Integrity: Goal: Risk for impaired skin integrity  will decrease Outcome: Progressing   

## 2022-02-26 NOTE — Progress Notes (Signed)
Thew away pt's tray he was finished with but did not allow for me to take away his chips and drink .... Adv him he is sched for a procedure in the morning and should not eat after midnight... pt verb understanding.

## 2022-02-26 NOTE — Progress Notes (Signed)
PROGRESS NOTE    Gary [name]  OZD:[id number] DOB: 05/19/1955 DOA: 01/23/2022 PCP: [facility]  [id number]  LOS: 23 days   Brief hospital course:   Assessment & Plan: Gary [name] is a 66 y.o. male with medical history significant for diastolic CHF, chronic respiratory failure status post tracheostomy tube placement 09/2021, advanced COPD, essential hypertension, PUD, sleep apnea, multiple hospitalizations who presented to the emergency room with acute onset of worsening dyspnea.     Apparently patient had been in the ER for 10 days as he was unable to be cared at home specially requirement of tracheostomy care, awaiting placement. Unfortunately he declined during his ER stay and in spite of being on torsemide developed worsening dyspnea and peripheral edema.  Repeat chest x-ray with worsening pulmonary edema.  Pt was admitted for management of acute on chronic diastolic heart failure.    Acute on chronic diastolic (congestive) heart failure  Patient to have some peripheral edema and concern of pulmonary vascular congestion on chest x-ray.  BNP was actually wnl. --cont torsemide   Acute on chronic respiratory failure with hypoxemia and hypercapnia   Advanced COPD Tracheostomy tube dependence - Currently stable.  At baseline requires intermittent vent support with trach collar at night time--pt using his own machine at home. --Tracheostomy with inspissated mucus plugging  --Pt wants to remove trach, however, per pulm Dr. Lanney Gins, "pt continues to need ventilator and has no family that is willing to help him. If we take it out, there may be tracheomalacia from scarring and next time he comes to be intubated it will be complicated he may code and die. So for now he needs to get better and get out of hospital then we can assess once in chronic stable state" as outpatient. - Continue bronchodilators and home inhalers --ENT for trach exchange on Tudsday   AKI  (acute kidney injury) --Cr peaked at 1.51, baseline around 1.2.  Back to baseline.   GERD without esophagitis - cont PPI   Obesity, Class III, BMI 40-49.9 (morbid obesity) ([place]) Estimated body mass index is 48.12 kg/m as calculated from the following:   Height as of this encounter: '6\' 3"'$  (1.905 m).   Weight as of this encounter: 174.6 kg.    Anxiety and depression -continue Lexapro and Klonopin as well as Seroquel.   Dyslipidemia --continue statin therapy.   Microcytic Iron def anemia --cont iron supplement --start IV iron x3 doses  Right knee pain 2/2 Lateral and medial meniscus tears --per MRI right knee.   --ortho consulted, most likely require a total knee replacement to address both the meniscal pathology and the degenerative changes, however, pt currently is not a surgical candidate. --s/p Kenalog injection on 11/10 with improved symptoms.  Hypokalemia --cont potassium 10 mEq daily    DVT prophylaxis: [id number] Code Status: Full code  Family Communication:  Level of care: Stepdown Dispo:   The patient is from: home Anticipated d/c is to: SNF Anticipated d/c date is: whenever bed available   Subjective and Interval History:  No acute event.  Pt worked with PT/OT today.   Objective: Vitals:   02/26/22 1123 02/26/22 1300 02/26/22 1605 02/26/22 1607  BP: (!) 134/93   (!) 140/94  Pulse: 81 82 77 76  Resp: (!) 22 (!) '21 11 14  '$ Temp:   97.8 F (36.6 C)   TempSrc:   Oral   SpO2: 97% 98% 97% 97%  Weight:      Height:  Intake/Output Summary (Last 24 hours) at 02/26/2022 1626 Last data filed at 02/26/2022 1607 Gross per 24 hour  Intake 1334.76 ml  Output 3650 ml  Net -2315.24 ml   Filed Weights   02/21/22 0500 02/25/22 0500 02/26/22 0500  Weight: (!) 191.7 kg (!) 180.5 kg (!) 181 kg    Examination:   Constitutional: NAD CV: No cyanosis.   RESP: trach prevent Extremities: edema in BLE   Data Reviewed: I have personally reviewed labs and  imaging studies  Time spent: 25 minutes  Enzo Bi, MD  [facility] If 7PM-7AM, please contact night-coverage 02/26/2022, 4:26 PM

## 2022-02-26 NOTE — Progress Notes (Signed)
Patient ID: Gary [name], male   DOB: December 20, 1955, 66 y.o.   MRN: [id number]  Subjective: The patient notes continued improvement in his right knee symptoms.  He was able to tolerate being stood up several times this weekend with physical therapy.  He has no new complaints regarding the right knee.   Objective: Vital signs in last 24 hours: Temp:  [98 F (36.7 C)-98.4 F (36.9 C)] 98.2 F (36.8 C) (11/13 0740) Pulse Rate:  [67-100] 70 (11/13 0740) Resp:  [13-27] 24 (11/13 0500) BP: (120-137)/(73-95) 129/92 (11/13 0740) SpO2:  [90 %-98 %] 97 % (11/13 0740) FiO2 (%):  [28 %] 28 % (11/13 0740) Weight:  [181 kg] 181 kg (11/13 0500)  Intake/Output from previous day: 11/12 0701 - 11/13 0700 In: 476.5 [P.O.:360; IV Piggyback:116.5] Out: 3450 [Urine:3450] Intake/Output this shift: No intake/output data recorded.  No results for input(s): "HGB" in the last 72 hours. No results for input(s): "WBC", "RBC", "HCT", "PLT" in the last 72 hours. Recent Labs    02/25/22 0851  NA 140  K 4.0  CL 106  CO2 27  BUN 46*  CREATININE 1.30*  GLUCOSE 170*  CALCIUM 8.8*   No results for input(s): "LABPT", "INR" in the last 72 hours.  Physical Exam: Orthopedic examination again is limited to the right knee and lower extremity.  Skin inspection is unremarkable.  No swelling, erythema, ecchymosis, abrasions, or other skin abnormalities are identified.  He is able to flex and extend his knee from approximately 20 to 70 degrees with a heel slide without pain.  He has no tenderness to palpation around the knee.  He is grossly neurovascular intact to the right lower extremity and foot.  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  Plan: The patient may continue to be mobilized with physical therapy, weightbearing as tolerated to the right knee.  I will sign off at this time.  Please reconsult me if further  orthopedic input is required during this hospitalization.   Marshall Cork Maximilliano Kersh 02/26/2022, 7:56 AM

## 2022-02-26 NOTE — Progress Notes (Signed)
Occupational Therapy Treatment Patient Details Name: Gary [name] MRN: [id number] DOB: 06/12/1955 Today's Date: 02/26/2022   History of present illness Pt is a 66 y.o. 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  Prolonged admittances earlier this year for similar. MD assessment includes: Acute on chronic diastolic CHF, AKI, and acute on chronic respiratory failure with hypoxia.   OT comments  Mr Nardozzi was seen for OT/PT co- treatment on this date. Upon arrival to room pt reclined in bed, agreeable to tx. Pt requires MIN A rolling bed level for BM on bed pan, MAX A pericare. MAX A don B shoes seated EOB. Attempted standing x3, unable to clear rear, appears to be limited by fear of falling. Pt making progress toward goals, will continue to follow POC. Discharge recommendation remains appropriate.     Recommendations for follow up therapy are one component of a multi-disciplinary discharge planning process, led by the attending physician.  Recommendations may be updated based on patient status, additional functional criteria and insurance authorization.    Follow Up Recommendations  Skilled nursing-short term rehab (<3 hours/day)     Assistance Recommended at Discharge Frequent or constant Supervision/Assistance  Patient can return home with the following  Two people to help with walking and/or transfers;Two people to help with bathing/dressing/bathroom   Equipment Recommendations  Hospital bed    Recommendations for Other Services      Precautions / Restrictions Precautions Precautions: Fall Restrictions Weight Bearing Restrictions: Yes RLE Weight Bearing: Weight bearing as tolerated       Mobility Bed Mobility Overal bed mobility: Needs Assistance Bed Mobility: Rolling, Supine to Sit, Sit to Supine Rolling: Min guard   Supine to sit: Min guard Sit to  supine: Max assist        Transfers                   General transfer comment: attempted standing several times without success despite bed height being elevated and +2 person assistance. cues for anterior weight shifting and technique to facilitate independence with standing. patient feels too far foward in sitting and requried moderate assistance to scoot hips posteriorly on the bed for safety and in preparation for standing     Balance Overall balance assessment: Needs assistance Sitting-balance support: Feet supported Sitting balance-[name] Scale: Good                                     ADL either performed or assessed with clinical judgement   ADL Overall ADL's : Needs assistance/impaired                                       General ADL Comments: MIN A rolling bed level for bed pan, MAX A pericare. MAX A don B shoes seated EOB      Cognition Arousal/Alertness: Awake/alert Behavior During Therapy: WFL for tasks assessed/performed Overall Cognitive Status: Within Functional Limits for tasks assessed                                                General  Comments patient had a loose bowel movement on the bed pan during session. maximal assistance for peri-care    Pertinent Vitals/ Pain       Pain Assessment Pain Assessment: Faces Faces Pain Scale: Hurts a little bit Pain Location: R knee Pain Descriptors / Indicators: Discomfort Pain Intervention(s): Limited activity within patient's tolerance   Frequency  Min 2X/week        Progress Toward Goals  OT Goals(current goals can now be found in the care plan section)  Progress towards OT goals: Progressing toward goals  Acute Rehab OT Goals Patient Stated Goal: to walk OT Goal Formulation: With patient Time For Goal Achievement: 03/07/22 Potential to Achieve Goals: Fair ADL Goals Pt Will Perform Grooming: with modified independence;sitting Pt Will  Perform Lower Body Dressing: sitting/lateral leans;with min assist Pt Will Transfer to Toilet: with min assist;bedside commode;stand pivot transfer  Plan Discharge plan remains appropriate;Frequency remains appropriate    Co-evaluation    PT/OT/SLP Co-Evaluation/Treatment: Yes Reason for Co-Treatment: For patient/therapist safety;To address functional/ADL transfers PT goals addressed during session: Mobility/safety with mobility OT goals addressed during session: ADL's and self-care      AM-PAC OT "6 Clicks" Daily Activity     Outcome Measure   Help from another person eating meals?: None Help from another person taking care of personal grooming?: A Little Help from another person toileting, which includes using toliet, bedpan, or urinal?: A Lot Help from another person bathing (including washing, rinsing, drying)?: A Lot Help from another person to put on and taking off regular upper body clothing?: A Little Help from another person to put on and taking off regular lower body clothing?: A Lot 6 Click Score: 16    End of Session    OT Visit Diagnosis: Other abnormalities of gait and mobility (R26.89);Muscle weakness (generalized) (M62.81)   Activity Tolerance Patient tolerated treatment well   Patient Left in bed;with call bell/phone within reach   Nurse Communication          Time: 5732-2025 OT Time Calculation (min): 27 min  Charges: OT General Charges $OT Visit: 1 Visit OT Treatments $Self Care/Home Management : 8-22 mins  [name], M.S. OTR/L  02/26/22, 4:08 PM  ascom [phone]

## 2022-02-26 NOTE — Progress Notes (Signed)
Physical Therapy Treatment Patient Details Name: Gary [name] MRN: [id number] DOB: 1955/08/19 Today's Date: 02/26/2022   History of Present Illness Pt is a 66 y.o. 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  Prolonged admittances earlier this year for similar. MD assessment includes: Acute on chronic diastolic CHF, AKI, and acute on chronic respiratory failure with hypoxia.    PT Comments    Patient is agreeable to PT session. Several transfer attempts performed with bed height elevated. He was unable to stand despite cues for anterior weight shifting and +2 person assistance provided. There is an order for a knee immobilizer and discussed this with RN, patient, and OT. The knee immobilizer will likely not be helpful with bed mobility or standing efforts. I would recommend to use the knee immobilizer if needed at a later time to prevent knee buckling after the patient is able to take steps. Currently the focus is transfer training, strengthening, and increasing standing tolerance in preparation for ambulation.  The patient is asking for PT to return again tomorrow. PT will continue to follow.    Recommendations for follow up therapy are one component of a multi-disciplinary discharge planning process, led by the attending physician.  Recommendations may be updated based on patient status, additional functional criteria and insurance authorization.  Follow Up Recommendations  Skilled nursing-short term rehab (<3 hours/day) Can patient physically be transported by private vehicle: No   Assistance Recommended at Discharge Frequent or constant Supervision/Assistance  Patient can return home with the following Two people to help with walking and/or transfers;Two people to help with bathing/dressing/bathroom;Assistance with cooking/housework;Assist for transportation;Help with stairs  or ramp for entrance   Equipment Recommendations  None recommended by PT    Recommendations for Other Services       Precautions / Restrictions Precautions Precautions: Fall Restrictions Weight Bearing Restrictions: No RLE Weight Bearing: Weight bearing as tolerated     Mobility  Bed Mobility   Bed Mobility: Rolling, Supine to Sit, Sit to Supine Rolling: Min guard   Supine to sit: Min guard Sit to supine: Max assist   General bed mobility comments: the most assistance was requried for BLE support to return to bed. verbal cues for technique    Transfers                   General transfer comment: attempted standing several times without success despite bed height being elevated and +2 person assistance. cues for anterior weight shifting and technique to facilitate independence with standing. patient feels too far foward in sitting and requried moderate assistance to scoot hips posteriorly on the bed for safety and in preparation for standing    Ambulation/Gait                   Stairs             Wheelchair Mobility    Modified Rankin (Stroke Patients Only)       Balance Overall balance assessment: Needs assistance Sitting-balance support: Feet supported Sitting balance-[name] Scale: Good Sitting balance - Comments: total sitting time around 10 minutes with education provided on transfer techniques to facilitate independence with standing bouts                                    Cognition Arousal/Alertness:  Awake/alert Behavior During Therapy: WFL for tasks assessed/performed Overall Cognitive Status: Within Functional Limits for tasks assessed                                 General Comments: patient does require safety cues with mobility efforts        Exercises      General Comments General comments (skin integrity, edema, etc.): patient had a loose bowel movement on the bed pan during session.  maximal assistance for peri-care      Pertinent Vitals/Pain Pain Assessment Pain Assessment: Faces Faces Pain Scale: Hurts a little bit Pain Location: R knee Pain Descriptors / Indicators: Discomfort Pain Intervention(s): Limited activity within patient's tolerance, Monitored during session    Home Living                          Prior Function            PT Goals (current goals can now be found in the care plan section) Acute Rehab PT Goals Patient Stated Goal: to be able to walk PT Goal Formulation: With patient Time For Goal Achievement: 03/11/22 Potential to Achieve Goals: Fair Progress towards PT goals: Progressing toward goals    Frequency    Min 2X/week      PT Plan Current plan remains appropriate    Co-evaluation PT/OT/SLP Co-Evaluation/Treatment: Yes Reason for Co-Treatment: To address functional/ADL transfers PT goals addressed during session: Mobility/safety with mobility        AM-PAC PT "6 Clicks" Mobility   Outcome Measure  Help needed turning from your back to your side while in a flat bed without using bedrails?: A Lot Help needed moving from lying on your back to sitting on the side of a flat bed without using bedrails?: A Lot Help needed moving to and from a bed to a chair (including a wheelchair)?: Total Help needed standing up from a chair using your arms (e.g., wheelchair or bedside chair)?: Total Help needed to walk in hospital room?: Total Help needed climbing 3-5 steps with a railing? : Total 6 Click Score: 8    End of Session Equipment Utilized During Treatment:  (trach collar, PMSV) Activity Tolerance: Patient tolerated treatment well Patient left: in bed;with call bell/phone within reach Nurse Communication: Mobility status PT Visit Diagnosis: Unsteadiness on feet (R26.81);Muscle weakness (generalized) (M62.81);Difficulty in walking, not elsewhere classified (R26.2)     Time: 6314-9702 PT Time Calculation (min)  (ACUTE ONLY): 33 min  Charges:  $Therapeutic Activity: 8-22 mins                     [name], PT, MPT    [name] 02/26/2022, 2:53 PM

## 2022-02-27 ENCOUNTER — Inpatient Hospital Stay: Payer: Medicaid Other | Admitting: Anesthesiology

## 2022-02-27 ENCOUNTER — Encounter
Admission: EM | Disposition: A | Discharge status: Home Health | Payer: Self-pay | Source: Home / Self Care | Attending: Hospitalist

## 2022-02-27 ENCOUNTER — Encounter: Payer: Self-pay | Admitting: Otolaryngology

## 2022-02-27 ENCOUNTER — Other Ambulatory Visit: Payer: Self-pay

## 2022-02-27 DIAGNOSIS — I5033 Acute on chronic diastolic (congestive) heart failure: Secondary | ICD-10-CM | POA: Diagnosis not present

## 2022-02-27 HISTORY — PX: TRACHEOSTOMY TUBE PLACEMENT: SHX814

## 2022-02-27 SURGERY — CREATION, TRACHEOSTOMY
Anesthesia: General

## 2022-02-27 MED ORDER — PROPOFOL 1000 MG/100ML IV EMUL
INTRAVENOUS | Status: AC
  Filled 2022-02-27: qty 100

## 2022-02-27 MED ORDER — OXYCODONE HCL 5 MG PO TABS: 5.0000 mg | ORAL_TABLET | Freq: Once | ORAL | Status: DC | PRN

## 2022-02-27 MED ORDER — MIDAZOLAM HCL 2 MG/2ML IJ SOLN
INTRAMUSCULAR | Status: DC | PRN
  Administered 2022-02-27: 2 mg via INTRAVENOUS

## 2022-02-27 MED ORDER — PROPOFOL 10 MG/ML IV BOLUS
INTRAVENOUS | Status: DC | PRN
  Administered 2022-02-27: 50 mg via INTRAVENOUS
  Administered 2022-02-27: 20 mg via INTRAVENOUS
  Administered 2022-02-27: 10 mg via INTRAVENOUS

## 2022-02-27 MED ORDER — KETAMINE HCL 10 MG/ML IJ SOLN
INTRAMUSCULAR | Status: DC | PRN
  Administered 2022-02-27: 10 mg via INTRAVENOUS
  Administered 2022-02-27: 30 mg via INTRAVENOUS
  Administered 2022-02-27: 10 mg via INTRAVENOUS

## 2022-02-27 MED ORDER — FENTANYL CITRATE (PF) 100 MCG/2ML IJ SOLN: 25.0000 ug | INTRAMUSCULAR | Status: DC | PRN

## 2022-02-27 MED ORDER — MIDAZOLAM HCL 2 MG/2ML IJ SOLN
INTRAMUSCULAR | Status: AC
  Filled 2022-02-27: qty 2

## 2022-02-27 MED ORDER — KETAMINE HCL 50 MG/5ML IJ SOSY
PREFILLED_SYRINGE | INTRAMUSCULAR | Status: AC
  Filled 2022-02-27: qty 5

## 2022-02-27 MED ORDER — PROPOFOL 500 MG/50ML IV EMUL: INTRAVENOUS | Status: DC | PRN

## 2022-02-27 MED ORDER — FENTANYL CITRATE (PF) 100 MCG/2ML IJ SOLN
INTRAMUSCULAR | Status: AC
  Filled 2022-02-27: qty 2

## 2022-02-27 MED ORDER — SILVER NITRATE-POT NITRATE 75-25 % EX MISC
CUTANEOUS | Status: DC | PRN
  Administered 2022-02-27: 6

## 2022-02-27 MED ORDER — OXYCODONE HCL 5 MG/5ML PO SOLN: 5.0000 mg | Freq: Once | ORAL | Status: DC | PRN

## 2022-02-27 MED ORDER — SODIUM CHLORIDE 0.9 % IV SOLN: INTRAVENOUS | Status: DC | PRN

## 2022-02-27 MED ORDER — PROPOFOL 500 MG/50ML IV EMUL
INTRAVENOUS | Status: DC | PRN
  Administered 2022-02-27: 50 ug/kg/min via INTRAVENOUS

## 2022-02-27 MED ORDER — GABAPENTIN 100 MG PO CAPS
200.0000 mg | ORAL_CAPSULE | Freq: Three times a day (TID) | ORAL | Status: DC
  Administered 2022-02-27 – 2022-03-22 (×69): 200 mg via ORAL
  Filled 2022-02-27 (×69): qty 2

## 2022-02-27 SURGICAL SUPPLY — 37 items
BLADE SURG 15 STRL LF DISP TIS (BLADE) ×1 IMPLANT
BLADE SURG 15 STRL SS (BLADE)
BLADE SURG SZ11 CARB STEEL (BLADE) ×1 IMPLANT
DRAPE MAG INST 16X20 L/F (DRAPES) ×1 IMPLANT
ELECT CAUTERY BLADE TIP 2.5 (TIP)
ELECT REM PT RETURN 9FT ADLT (ELECTROSURGICAL) ×1
ELECTRODE CAUTERY BLDE TIP 2.5 (TIP) ×1 IMPLANT
ELECTRODE REM PT RTRN 9FT ADLT (ELECTROSURGICAL) ×1 IMPLANT
GAUZE 4X4 16PLY ~~LOC~~+RFID DBL (SPONGE) ×1 IMPLANT
GLOVE BIO SURGEON STRL SZ7.5 (GLOVE) ×1 IMPLANT
GOWN STRL REUS W/ TWL LRG LVL3 (GOWN DISPOSABLE) ×2 IMPLANT
GOWN STRL REUS W/TWL LRG LVL3 (GOWN DISPOSABLE) ×1
HEMOSTAT SURGICEL 2X3 (HEMOSTASIS) IMPLANT
HLDR TRACH TUBE NECKBAND 18 (MISCELLANEOUS) ×1 IMPLANT
HOLDER TRACH TUBE NECKBAND 18 (MISCELLANEOUS) ×1
KIT TURNOVER KIT A (KITS) ×1 IMPLANT
LABEL OR SOLS (LABEL) ×1 IMPLANT
MANIFOLD NEPTUNE II (INSTRUMENTS) ×1 IMPLANT
NS IRRIG 500ML POUR BTL (IV SOLUTION) ×1 IMPLANT
PACK HEAD/NECK (MISCELLANEOUS) ×1 IMPLANT
SHEARS HARMONIC 9CM CVD (BLADE) ×1 IMPLANT
SPONGE DRAIN TRACH 4X4 STRL 2S (GAUZE/BANDAGES/DRESSINGS) ×1 IMPLANT
SPONGE KITTNER 5P (MISCELLANEOUS) ×1 IMPLANT
SUCTION FRAZIER HANDLE 10FR (MISCELLANEOUS)
SUCTION TUBE FRAZIER 10FR DISP (MISCELLANEOUS) ×1 IMPLANT
SUT ETHILON 2 0 FS 18 (SUTURE) ×1 IMPLANT
SUT SILK 2 0 (SUTURE)
SUT SILK 2-0 18XBRD TIE 12 (SUTURE) IMPLANT
SUT VICRYL+ 4-0 18IN PS-4 (SUTURE) ×1 IMPLANT
SYR 10ML LL (SYRINGE) ×1 IMPLANT
TRAP FLUID SMOKE EVACUATOR (MISCELLANEOUS) ×1 IMPLANT
TUBE TRACH  6.0 CUFF FLEX (MISCELLANEOUS)
TUBE TRACH 6.0 CUFF FLEX (MISCELLANEOUS) IMPLANT
TUBE TRACH 7 PROX EXL UNCUF (TUBING) IMPLANT
TUBE TRACH FLEX 8.0 CUFF (MISCELLANEOUS)
TUBE TRACH FLEX 8.5 CUFF (MISCELLANEOUS) IMPLANT
WATER STERILE IRR 500ML POUR (IV SOLUTION) ×1 IMPLANT

## 2022-02-27 NOTE — Plan of Care (Signed)
  Problem: Education: Goal: Ability to demonstrate management of disease process will improve Outcome: Progressing Goal: Ability to verbalize understanding of medication therapies will improve Outcome: Progressing   Problem: Activity: Goal: Capacity to carry out activities will improve Outcome: Progressing   Problem: Cardiac: Goal: Ability to achieve and maintain adequate cardiopulmonary perfusion will improve Outcome: Progressing   Problem: Education: Goal: Knowledge of General Education information will improve Description: Including pain rating scale, medication(s)/side effects and non-pharmacologic comfort measures Outcome: Progressing   Problem: Health Behavior/Discharge Planning: Goal: Ability to manage health-related needs will improve Outcome: Progressing   Problem: Clinical Measurements: Goal: Ability to maintain clinical measurements within normal limits will improve Outcome: Progressing Goal: Will remain free from infection Outcome: Progressing Goal: Diagnostic test results will improve Outcome: Progressing Goal: Respiratory complications will improve Outcome: Progressing Goal: Cardiovascular complication will be avoided Outcome: Progressing   Problem: Activity: Goal: Risk for activity intolerance will decrease Outcome: Progressing   Problem: Nutrition: Goal: Adequate nutrition will be maintained Outcome: Progressing   Problem: Coping: Goal: Level of anxiety will decrease Outcome: Progressing   Problem: Elimination: Goal: Will not experience complications related to bowel motility Outcome: Progressing Goal: Will not experience complications related to urinary retention Outcome: Progressing   Problem: Pain Managment: Goal: General experience of comfort will improve Outcome: Progressing   Problem: Safety: Goal: Ability to remain free from injury will improve Outcome: Progressing   Problem: Skin Integrity: Goal: Risk for impaired skin integrity  will decrease Outcome: Progressing   

## 2022-02-27 NOTE — Op Note (Signed)
..  02/27/2022  10:13 AM    [name]  [id number]   Pre-Op Dx:  Morbid obesity ([place]) [E66.01] Acute pulmonary edema ([name]) [J81.0] Peripheral edema [R60.9] SOB (shortness of breath) [R06.02] Tracheostomy in place [facility]) [Z93.0] Acute on chronic diastolic (congestive) heart failure ([name]) [I50.33] Acute on chronic congestive heart failure, unspecified heart failure type ([place]) [I50.9]  Post-op Dx: Morbid obesity ([place]) [E66.01] Acute pulmonary edema ([name]) [J81.0] Peripheral edema [R60.9] SOB (shortness of breath) [R06.02] Tracheostomy in place [facility]) [Z93.0] Acute on chronic diastolic (congestive) heart failure ([name]) [I50.33] Acute on chronic congestive heart failure, unspecified heart failure type ([place]) [I50.9]  Proc:   1)  Tracheostomy tube exchange with 7.0 Proximal Extension Shiley XLT Cuffless  2)  Cauterization of granulation tissue around tracheostoma  Surg: Proofreader:  McQueen  Anes:  General via existing tracheostomy  EBL:  None  Comp:  None  Findings:  Moderate granulation tissue present on superior aspect of tracheostoma that was cauterized with silver nitrate sticks.  Size 7.0 cuffed trach removed and replaced with Size 7.0 cuffless Proximal extension Shiley XLT.  Procedure: With the patient in a comfortable supine position, general mask anesthesia was administered through existing tracheostomy tube.    Moderate granulation tissue present on superior aspect of tracheostoma that was cauterized with silver nitrate sticks.  At this time, the Shiley 7.0 XLT cuffed trach was removed and a new Shiley 7.0 Proximal extension UNCUFFED trach was replaced without difficulty.  Following this  The patient was returned to anesthesia, awakened, and transferred to recovery in stable condition.  Dispo:  Back to ICU  Plan: Routine tracheostomy Care.   Concha Sudol 10:13 AM 02/27/2022

## 2022-02-27 NOTE — Progress Notes (Signed)
PT Cancellation Note  Patient Details Name: Gary [name] MRN: [id number] DOB: 1956/04/11   Cancelled Treatment:    Reason Eval/Treat Not Completed: Patient declined, no reason specified. Patient requesting PT follow up tomorrow. He reports he is tired and sore from surgery this morning.   [name], PT, MPT    [name] 02/27/2022, 3:41 PM

## 2022-02-27 NOTE — Progress Notes (Signed)
OT Cancellation Note  Patient Details Name: Gary [name] MRN: [id number] DOB: 03-Oct-1955   Cancelled Treatment:    Reason Eval/Treat Not Completed: Patient declined, no reason specified. Chart reveiwed. Upon arrival pt reports needing to take a break from therapy today - cites fatigue / neck pain from procedure this AM. Will continue to follow POC.   [name], M.S. OTR/L  02/27/22, 3:29 PM  ascom [phone]

## 2022-02-27 NOTE — Anesthesia Procedure Notes (Signed)
Procedure Name: General with mask airway Date/Time: 02/27/2022 10:31 AM  Performed by: Kelton Pillar, CRNAPre-anesthesia Checklist: Patient identified, Emergency Drugs available, Suction available and Patient being monitored Patient Re-evaluated:Patient Re-evaluated prior to induction Oxygen Delivery Method: Simple face mask and Circle system utilized Preoxygenation: Pre-oxygenation with 100% oxygen Induction Type: IV induction Placement Confirmation: CO2 detector, breath sounds checked- equal and bilateral and positive ETCO2 Dental Injury: Teeth and Oropharynx as per pre-operative assessment  Comments: Pt w/trach, mask and circle system used in pre-oxygenation/during case as needed -[facility] CRNA

## 2022-02-27 NOTE — Progress Notes (Signed)
Pt refused lab team to draw blood this am... pt education given and pt verb understanding.
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
## 2022-02-27 NOTE — Progress Notes (Signed)
Patient tolerated 90 cc water without stopping, coughing, or choking. Resuming diet as ordered.

## 2022-02-27 NOTE — Transfer of Care (Addendum)
Immediate Anesthesia Transfer of Care Note  Patient: Gary [name]  Procedure(s) Performed: TRACHEOSTOMY TUBE CHANGE, CAUTERIZATION OF GRANULATION TISSUE  Patient Location: ICU  Anesthesia Type:General  Level of Consciousness: drowsy and patient cooperative  Airway & Oxygen Therapy: Patient Spontanous Breathing and Patient connected to tracheostomy mask oxygen  Post-op Assessment: Report given to RN and Post -op Vital signs reviewed and stable  Post vital signs: Reviewed and stable  Last Vitals:  Vitals Value Taken Time  BP    Temp    Pulse    Resp    SpO2      Last Pain:  Vitals:   02/27/22 0813  TempSrc: Oral  PainSc:       Patients Stated Pain Goal: 0 (80/22/33 6122)  Complications: No notable events documented.

## 2022-02-27 NOTE — H&P (Addendum)
..  02/27/2022 9:40 AM  [name] [id number]   Temp:  [97.3 F (36.3 C)-99 F (37.2 C)] 99 F (37.2 C) (11/14 0813) Pulse Rate:  [62-85] 73 (11/14 0832) Resp:  [11-31] 31 (11/13 1930) BP: (96-140)/(53-95) 114/78 (11/14 0832) SpO2:  [92 %-100 %] 99 % (11/14 0832) FiO2 (%):  [28 %] 28 % (11/14 0813),     Intake/Output Summary (Last 24 hours) at 02/27/2022 0940 Last data filed at 02/27/2022 0830 Gross per 24 hour  Intake 1514.76 ml  Output 3500 ml  Net -1985.24 ml    No results found for this or any previous visit (from the past 24 hour(s)).  Patient refused labwork this morning but otherwise reports doing well with no changes.  Plan is to exchange tracheostomy tube with cuffless as per Pulm request as well as cauterize granulation tissue.  Consent reviewed and patient again demonstrates understanding.  Gary [name] 02/27/2022, 9:40 AM

## 2022-02-27 NOTE — Progress Notes (Addendum)
Patient was received from OR via bed accompanied by anesthesia and nursing around 1030. Awake and alert, asking for water and food. Verbally instructed by anesthesia to wake about an hour to eat or drink due to medications given in OR. Pt is now extremely irritable, demanding water to drink, yelling out. Reports that he is awake enough to at least consume water. Will perform swallow test before allowing pt to eat. O2 sats WNL and trach site clean dry and intact. Pt coughing up small amounts of pink tinged sputum; lightly suctioned via trach for same per pt request.

## 2022-02-27 NOTE — Progress Notes (Signed)
Charge RN unable to obtain BP d/t monitoring device failed to take BP again.... attempted to troubleshoot monitor... Was able to troubleshoot monitor and now taking BP .... While taking BP, I asked pt to remain silent and sit still .... Pt did not like me telling him to sit still and remain quiet and began to yell and call me a "wetback"... Charge nurse in room.... she tried to educate pt and calm him down.

## 2022-02-27 NOTE — Progress Notes (Signed)
PROGRESS NOTE    Gary [name]  GEX:[id number] DOB: 29-Sep-1955 DOA: 01/23/2022 PCP: [facility] [place]  [id number]/[id number]-[id number]  LOS: 24 days   Brief hospital course:   Assessment & Plan: Gary [name] is a 66 y.o. male with medical history significant for diastolic CHF, chronic respiratory failure status post tracheostomy tube placement 09/2021, advanced COPD, essential hypertension, PUD, sleep apnea, multiple hospitalizations who presented to the emergency room with acute onset of worsening dyspnea.     Apparently patient had been in the ER for 10 days as he was unable to be cared at home specially requirement of tracheostomy care, awaiting placement. Unfortunately he declined during his ER stay and in spite of being on torsemide developed worsening dyspnea and peripheral edema.  Repeat chest x-ray with worsening pulmonary edema.  Pt was admitted for management of acute on chronic diastolic heart failure.    Acute on chronic diastolic (congestive) heart failure  Patient to have some peripheral edema and concern of pulmonary vascular congestion on chest x-ray.  BNP was actually wnl. --cont torsemide   Acute on chronic respiratory failure with hypoxemia and hypercapnia   Advanced COPD Tracheostomy tube dependence - Currently stable.  At baseline requires intermittent vent support with trach collar at night time--pt using his own machine at home. --Tracheostomy with inspissated mucus plugging  --Pt wants to remove trach, however, per pulm Dr. Lanney Gins, "pt continues to need ventilator and has no family that is willing to help him. If we take it out, there may be tracheomalacia from scarring and next time he comes to be intubated it will be complicated he may code and die. So for now he needs to get better and get out of hospital then we can assess once in chronic stable state" as outpatient. - Continue bronchodilators and home inhalers --trach exchange today with ENT   AKI (acute  kidney injury) --Cr peaked at 1.51, baseline around 1.2.  Back to baseline.   GERD without esophagitis - cont PPI   Obesity, Class III, BMI 40-49.9 (morbid obesity) ([place]) Estimated body mass index is 48.12 kg/m as calculated from the following:   Height as of this encounter: '6\' 3"'$  (1.905 m).   Weight as of this encounter: 174.6 kg.    Anxiety and depression -continue Lexapro    Dyslipidemia --continue statin therapy.   Microcytic Iron def anemia --cont iron supplement --s/p IV iron x3 doses  Right knee pain 2/2 Lateral and medial meniscus tears --per MRI right knee.   --ortho consulted, most likely require a total knee replacement to address both the meniscal pathology and the degenerative changes, however, pt currently is not a surgical candidate. --s/p Kenalog injection on 11/10 with improved symptoms.  Hypokalemia --cont potassium 10 mEq daily    DVT prophylaxis: [id number] Code Status: Full code  Family Communication:  Level of care: Stepdown Dispo:   The patient is from: home Anticipated d/c is to: SNF with vent capability Anticipated d/c date is: whenever bed available   Subjective and Interval History:  ENT removed granulation tissue and exchanged trach today.  Pt said his breathing was clearer after the procedure.    Complained of left hand pain and burning.   Objective: Vitals:   02/27/22 1100 02/27/22 1200 02/27/22 1300 02/27/22 1400  BP: 126/81     Pulse: 73 77 93 87  Resp: 20 (!) 25 (!) 29 (!) 24  Temp:      TempSrc:      SpO2: 93% 91%  93% 93%  Weight:      Height:        Intake/Output Summary (Last 24 hours) at 02/27/2022 1550 Last data filed at 02/27/2022 1400 Gross per 24 hour  Intake 1150 ml  Output 2650 ml  Net -1500 ml   Filed Weights   02/21/22 0500 02/25/22 0500 02/26/22 0500  Weight: (!) 191.7 kg (!) 180.5 kg (!) 181 kg    Examination:   Constitutional: NAD, AAOx3 HEENT: conjunctivae and lids normal, EOMI CV: No  cyanosis.   RESP: trach present SKIN: warm, dry Neuro: II - XII grossly intact.     Data Reviewed: I have personally reviewed labs and imaging studies  Time spent: 25 minutes  Enzo Bi, MD  [facility] If 7PM-7AM, please contact night-coverage 02/27/2022, 3:50 PM

## 2022-02-27 NOTE — Progress Notes (Addendum)
Patient refused labs to be drawn this am. Current order is for labs on Tuesday and Fridays. Last BMP was this past Sunday, on 11/12. Last CBC was on Friday, 11/10. Dr. Pryor Ochoa notified.

## 2022-02-27 NOTE — Progress Notes (Signed)
Patient very irritable, requests something to drink, initially refused mouthcare or swab for dryness. Education provided. Report given to OR, and patient will be going to OR soon. Pt was updated, and accepted mouthcare. Telemetry patches changed, gown changed. Teeth brushed. [place] (external catheter) CDI. Diet ordered for patient postoperatively, so as to have his breakfast preferences honored. Will reassess patient's LOC, post op orders, and appropriateness of diet upon return from OR. Patient satisfied with plan at this time.

## 2022-02-27 NOTE — Progress Notes (Addendum)
Patient consumed late breakfast as well as lunch. Tolerated well. Reported left hand discomfort as well as pain on bottoms of feet. Given tylenol and then patient took nap. Reports that Tylenol helped "a little bit". Patient also refused PT today due to feeling "sore". Gabapentin dose was increased per Dr. Billie Ruddy. Also complains also of throat pain; talking on telephone at this time.

## 2022-02-27 NOTE — Progress Notes (Signed)
Pt refused CHG bath ... Sts "it's too cold and nobody is going to wipe him down with anything and to get him a warm blanket."  Warm blanket provided.

## 2022-02-28 DIAGNOSIS — J449 Chronic obstructive pulmonary disease, unspecified: Secondary | ICD-10-CM | POA: Diagnosis not present

## 2022-02-28 DIAGNOSIS — J9621 Acute and chronic respiratory failure with hypoxia: Secondary | ICD-10-CM | POA: Diagnosis not present

## 2022-02-28 DIAGNOSIS — I5033 Acute on chronic diastolic (congestive) heart failure: Secondary | ICD-10-CM | POA: Diagnosis not present

## 2022-02-28 NOTE — Progress Notes (Signed)
Pt refused 0400 BP and temp check... pt education given.

## 2022-02-28 NOTE — Progress Notes (Signed)
Physical Therapy Treatment Patient Details Name: ALEXSANDRO SALEK MRN: [id number] DOB: Jan 11, 1956 Today's Date: 02/28/2022   History of Present Illness Pt is a 66 y.o. 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  Prolonged admittances earlier this year for similar. MD assessment includes: Acute on chronic diastolic CHF, AKI, and acute on chronic respiratory failure with hypoxia.    PT Comments    Patient is agreeable to PT. He was able to stand x 2 bouts with +2 person assistance with bed height elevated significantly per patient request. Standing tolerance of around 40 seconds with each standing bout. Patient was fatigued with activity and required sitting rest break between standing bouts. Unable to stand long enough to safely attempt taking any steps. Recommend to continue PT to maximize independence and facilitate return to prior level of function. Continue to recommend SNF.    Recommendations for follow up therapy are one component of a multi-disciplinary discharge planning process, led by the attending physician.  Recommendations may be updated based on patient status, additional functional criteria and insurance authorization.  Follow Up Recommendations  Skilled nursing-short term rehab (<3 hours/day) Can patient physically be transported by private vehicle: No   Assistance Recommended at Discharge Frequent or constant Supervision/Assistance  Patient can return home with the following Two people to help with walking and/or transfers;Two people to help with bathing/dressing/bathroom;Assistance with cooking/housework;Assist for transportation;Help with stairs or ramp for entrance   Equipment Recommendations  None recommended by PT    Recommendations for Other Services       Precautions / Restrictions Precautions Precautions: Fall Precaution Comments: cuffed trach/  trach collar Restrictions Weight Bearing Restrictions: Yes RLE Weight Bearing: Weight bearing as tolerated     Mobility  Bed Mobility Overal bed mobility: Needs Assistance Bed Mobility: Supine to Sit, Sit to Supine     Supine to sit: Min guard Sit to supine: Max assist   General bed mobility comments: assistance for BLE support to return to bed. verbal cues for sequencing and technique    Transfers Overall transfer level: Needs assistance Equipment used: Rolling walker (2 wheels) (green bariatic walker) Transfers: Sit to/from Stand Sit to Stand: Min assist, +2 physical assistance, +2 safety/equipment, From elevated surface           General transfer comment: bed height elevated significantly per patient request. 2 bouts of standing performed with cues for LE positioning and anterior weight shifting    Ambulation/Gait                   Stairs             Wheelchair Mobility    Modified Rankin (Stroke Patients Only)       Balance Overall balance assessment: Needs assistance Sitting-balance support: Feet supported Sitting balance-[name] Scale: Good     Standing balance support: Bilateral upper extremity supported, Reliant on assistive device for balance Standing balance-[name] Scale: Poor Standing balance comment: faciliation for anterior weight shifitng. standing tolerance was 40 seconds performed x 2 bouts                            Cognition Arousal/Alertness: Awake/alert Behavior During Therapy: WFL for tasks assessed/performed Overall Cognitive Status: Within Functional Limits for tasks assessed  Exercises      General Comments        Pertinent Vitals/Pain Pain Assessment Pain Assessment: No/denies pain    Home Living                          Prior Function            PT Goals (current goals can now be found in the care plan section) Acute Rehab  PT Goals Patient Stated Goal: to be able to walk PT Goal Formulation: With patient Time For Goal Achievement: 03/11/22 Potential to Achieve Goals: Fair Progress towards PT goals: Progressing toward goals    Frequency    Min 2X/week      PT Plan Current plan remains appropriate    Co-evaluation PT/OT/SLP Co-Evaluation/Treatment: Yes Reason for Co-Treatment: Complexity of the patient's impairments (multi-system involvement) PT goals addressed during session: Mobility/safety with mobility OT goals addressed during session: ADL's and self-care      AM-PAC PT "6 Clicks" Mobility   Outcome Measure  Help needed turning from your back to your side while in a flat bed without using bedrails?: A Lot Help needed moving from lying on your back to sitting on the side of a flat bed without using bedrails?: A Lot Help needed moving to and from a bed to a chair (including a wheelchair)?: Total Help needed standing up from a chair using your arms (e.g., wheelchair or bedside chair)?: Total Help needed to walk in hospital room?: Total Help needed climbing 3-5 steps with a railing? : Total 6 Click Score: 8    End of Session Equipment Utilized During Treatment:  (trach mask, PMSV) Activity Tolerance: Patient tolerated treatment well Patient left: in bed;with call bell/phone within reach   PT Visit Diagnosis: Unsteadiness on feet (R26.81);Muscle weakness (generalized) (M62.81);Difficulty in walking, not elsewhere classified (R26.2)     Time: 6712-4580 PT Time Calculation (min) (ACUTE ONLY): 25 min  Charges:  $Therapeutic Activity: 8-22 mins                     [name], PT, MPT    [name] 02/28/2022, 3:11 PM

## 2022-02-28 NOTE — Progress Notes (Signed)
Progress Note   Patient: Gary [name] HYW:[id number] DOB: 03-Sep-1955 DOA: 01/23/2022     25 DOS: the patient was seen and examined on 02/28/2022   Brief hospital course:  Gary [name] is a 66 y.o. male with medical history significant for diastolic CHF, chronic respiratory failure status post tracheostomy, COPD, essential hypertension, PUD and sleep apnea who presented to the emergency room with acute onset of worsening dyspnea.  Apparently patient has been in the ER for the past 10 days as he was unable to be cared at home specially requirement of tracheostomy care, awaiting placement. He developed worsening pulmonary edema. Being admitted for management of acute on chronic diastolic heart failure and given IV Lasix.  Patient condition has gradually improved.  He was able to tolerate nocturnal trilogy ventilation.  Currently pending nursing placement.  Assessment and Plan: Acute on chronic diastolic (congestive) heart failure   Acute on chronic respiratory failure with hypoxemia and hypercapnia   Advanced COPD Tracheostomy tube dependence Condition had improved, currently on oral torsemide.  Trach exchange performed 11/14.   AKI (acute kidney injury) Caused by diuretics, renal function back to baseline.   GERD without esophagitis On PPI.   Obesity, Class III, BMI 40-49.9 (morbid obesity) ([place]) Estimated body mass index is 48.12 kg/m as calculated from the following:   Height as of this encounter: '6\' 3"'$  (1.905 m).   Weight as of this encounter: 174.6 kg.    Anxiety and depression On Lexapro.  Dyslipidemia --continue statin therapy.   Microcytic Iron def anemia --cont iron supplement --s/p IV iron x3 doses   Right knee pain 2/2 Lateral and medial meniscus tears --per MRI right knee.   --ortho consulted, most likely require a total knee replacement to address both the meniscal pathology and the degenerative changes, however, pt currently is not a surgical  candidate. --s/p Kenalog injection on 11/10 with improved symptoms.   Hypokalemia --cont potassium 10 mEq daily  Discharge planning.  Had a long discussion with patient, he wished to go home.  Discussed with TOC and RN, patient currently has no family taking care of him, home health has discharged him, does not want take him back. He will need nocturnal trilogy over trach collar.  Currently, the best option is short-term rehab.  Patient is receptive to short-term rehab.     Subjective:  Patient doing well today, no significant shortness of breath.  Physical Exam: Vitals:   02/27/22 2000 02/27/22 2020 02/27/22 2022 02/28/22 0825  BP:  (!) 145/88  113/78  Pulse: 87  89 85  Resp: 20  (!) 21 (!) 21  Temp: 99.1 F (37.3 C)   98.6 F (37 C)  TempSrc: Oral   Oral  SpO2: 97%  95% 96%  Weight:      Height:       General exam: Appears calm and comfortable, morbid obese. Respiratory system: Clear to auscultation. Respiratory effort normal. Cardiovascular system: S1 & S2 heard, RRR. No JVD, murmurs, rubs, gallops or clicks. No pedal edema. Gastrointestinal system: Abdomen is nondistended, soft and nontender. No organomegaly or masses felt. Normal bowel sounds heard. Central nervous system: Alert and oriented. No focal neurological deficits. Extremities: Symmetric 5 x 5 power. Skin: No rashes, lesions or ulcers Psychiatry: Judgement and insight appear normal. Mood & affect appropriate.   Data Reviewed:  Reviewed lab results  Family Communication: None  Disposition: Status is: Inpatient Remains inpatient appropriate because: Unsafe discharge.  Planned Discharge Destination: Skilled nursing facility  Time spent: 50 minutes  Author: Sharen Hones, MD 02/28/2022 11:12 AM  For on call review [email or web address].

## 2022-02-28 NOTE — Progress Notes (Signed)
Occupational Therapy Treatment Patient Details Name: Gary [name] MRN: [id number] DOB: 04-Jul-1955 Today's Date: 02/28/2022   History of present illness Pt is a 66 y.o. 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  Prolonged admittances earlier this year for similar. MD assessment includes: Acute on chronic diastolic CHF, AKI, and acute on chronic respiratory failure with hypoxia.   OT comments  Gary [name] was seen for OT treatment on this date. Upon arrival to room pt reclined in bed, agreeable to tx. Pt requires MIN A x2 + RW sit<>stand at elevated bed x2 trials ~40 sec each, unable to progress to steps. MAX A don B shoes in sitting. Noted to have wet bed, cleaned and provided new sheets. Pt making progress toward goals, will continue to follow POC. Discharge recommendation remains appropriate.     Recommendations for follow up therapy are one component of a multi-disciplinary discharge planning process, led by the attending physician.  Recommendations may be updated based on patient status, additional functional criteria and insurance authorization.    Follow Up Recommendations  Skilled nursing-short term rehab (<3 hours/day)     Assistance Recommended at Discharge Frequent or constant Supervision/Assistance  Patient can return home with the following  Two people to help with walking and/or transfers;Two people to help with bathing/dressing/bathroom   Equipment Recommendations  Hospital bed    Recommendations for Other Services      Precautions / Restrictions Precautions Precautions: Fall Precaution Comments: cuffed trach/ trach collar Restrictions Weight Bearing Restrictions: Yes RLE Weight Bearing: Weight bearing as tolerated       Mobility Bed Mobility Overal bed mobility: Needs Assistance Bed Mobility: Supine to Sit, Sit to Supine     Supine to sit:  Min guard Sit to supine: Max assist        Transfers Overall transfer level: Needs assistance Equipment used: Rolling walker (2 wheels) Transfers: Sit to/from Stand Sit to Stand: Min assist, +2 physical assistance, +2 safety/equipment, From elevated surface           General transfer comment: x2, ~40 sec each     Balance Overall balance assessment: Needs assistance Sitting-balance support: Feet supported Sitting balance-[name] Scale: Good     Standing balance support: Bilateral upper extremity supported, Reliant on assistive device for balance Standing balance-[name] Scale: Poor                             ADL either performed or assessed with clinical judgement   ADL Overall ADL's : Needs assistance/impaired                                       General ADL Comments: MIN A x2 + RW for functional mobility, unable to progress to steps. MAX A don B shoes in sitting      Cognition Arousal/Alertness: Awake/alert Behavior During Therapy: WFL for tasks assessed/performed Overall Cognitive Status: Within Functional Limits for tasks assessed                                          Pertinent Vitals/ Pain       Pain Assessment Pain Assessment: Faces Faces Pain  Scale: Hurts a little bit Pain Location: R knee Pain Descriptors / Indicators: Discomfort Pain Intervention(s): Limited activity within patient's tolerance   Frequency  Min 2X/week        Progress Toward Goals  OT Goals(current goals can now be found in the care plan section)  Progress towards OT goals: Progressing toward goals  Acute Rehab OT Goals Patient Stated Goal: to go home OT Goal Formulation: With patient Time For Goal Achievement: 03/07/22 Potential to Achieve Goals: Fair ADL Goals Pt Will Perform Grooming: with modified independence;sitting Pt Will Perform Lower Body Dressing: sitting/lateral leans;with min assist Pt Will Transfer to Toilet:  with min assist;bedside commode;stand pivot transfer  Plan Discharge plan remains appropriate;Frequency remains appropriate    Co-evaluation    PT/OT/SLP Co-Evaluation/Treatment: Yes Reason for Co-Treatment: Complexity of the patient's impairments (multi-system involvement) PT goals addressed during session: Mobility/safety with mobility OT goals addressed during session: ADL's and self-care      AM-PAC OT "6 Clicks" Daily Activity     Outcome Measure   Help from another person eating meals?: None Help from another person taking care of personal grooming?: A Little Help from another person toileting, which includes using toliet, bedpan, or urinal?: A Lot Help from another person bathing (including washing, rinsing, drying)?: A Lot Help from another person to put on and taking off regular upper body clothing?: A Little Help from another person to put on and taking off regular lower body clothing?: A Lot 6 Click Score: 16    End of Session    OT Visit Diagnosis: Other abnormalities of gait and mobility (R26.89);Muscle weakness (generalized) (M62.81)   Activity Tolerance Patient tolerated treatment well   Patient Left in bed;with call bell/phone within reach   Nurse Communication          Time: 1975-8832 OT Time Calculation (min): 25 min  Charges: OT General Charges $OT Visit: 1 Visit OT Treatments $Self Care/Home Management : 8-22 mins  [name], M.S. OTR/L  02/28/22, 3:17 PM  ascom [phone]

## 2022-02-28 NOTE — TOC Progression Note (Signed)
Transition of Care [facility]) - Progression Note    Patient Details  Name: Gary [name] MRN: [id number] Date of Birth: 09/29/1955  [facility] CM/SW Contact  Shelbie Hutching, RN Phone Number: 02/28/2022, 10:58 AM  Clinical Narrative:    Spoke with patient at the bedside about discharge planning.  Per nurse, patient told the MD that he was not going to a skilled nursing facility. Patient told this RNCM that he was not going to go far away and he was not going to stay.  He said he wanted to get therapy to walk again.  Informed patient that referrals have been sent to [facility] and to [employer] to [facility].  [facility] in [place] was an automatic denial due to patient's weight.   Patient reports that he feels like he is getting stronger and has stood up and sat in the chair working with PT.    Plan for patient to continue to work with PT and hopefully get him back home if SNF cannot be found.     Expected Discharge Plan: [place] Barriers to Discharge: Continued Medical Work up, Ship broker, Unsafe home situation  Expected Discharge Plan and Services Expected Discharge Plan: [place] In-house Referral: Clinical Social Work     Living arrangements for the past 2 months: Single Family Home                                       Social Determinants of Health (SDOH) Interventions    Readmission Risk Interventions    12/31/2021    8:58 AM 12/20/2021    4:21 PM 05/04/2021    1:26 PM  Readmission Risk Prevention Plan  Transportation Screening Complete Complete Complete  Medication Review Press photographer) Complete Complete Complete  PCP or Specialist appointment within 3-5 days of discharge Complete Complete Complete  HRI or Home Care Consult Complete Complete Complete  SW Recovery Care/Counseling Consult Complete Complete Complete  Palliative Care Screening Complete Complete Not  [place] Not Applicable Not Applicable Not Applicable

## 2022-02-28 NOTE — Progress Notes (Signed)
Pt refused midnight temp and BP check.

## 2022-02-28 NOTE — NC FL2 (Signed)
[place] LEVEL OF CARE SCREENING TOOL     IDENTIFICATION  Patient Name: Gary [name] Birthdate: [birth year] Sex: male Admission Date (Current Location): 01/23/2022  [place] and [place] Number:  Engineering geologist and Address:  [facility], [address], [place], [place] [address]      Provider Number: [id number]  Attending Physician Name and Address:  Sharen Hones, MD  Relative Name and Phone Number:  [name] [phone]    Current Level of Care: Hospital Recommended Level of Care: [place] Prior Approval Number:    Date Approved/Denied:   PASRR Number: [id number]  Discharge Plan: SNF    Current Diagnoses: Patient Active Problem List   Diagnosis Date Noted   Acute on chronic diastolic (congestive) heart failure ([place]) 02/03/2022   Chronic obstructive pulmonary disease (COPD) ([place]) 02/03/2022   GERD without esophagitis 02/03/2022   Anxiety and depression 02/03/2022   Dyslipidemia 02/03/2022   Bloody stool 11/12/2021   Major depressive disorder, recurrent episode, moderate ([place]) 10/22/2021   Pressure injury of skin 09/27/2021   Acute on chronic respiratory failure with hypercapnia ([place]) 08/07/2021   COPD (chronic obstructive pulmonary disease) ([place])    HLD (hyperlipidemia)    Iron deficiency anemia    Chronic kidney disease, stage 3a ([place]) 07/22/2021   Acute on chronic respiratory failure with hypoxia and hypercapnia ([place]) 07/20/2021   Chronic obstructive pulmonary disease ([place])    Acute CHF (congestive heart failure) ([place]) 04/04/2021   Weakness    Acute respiratory failure with hypoxia and hypercapnia ([place]) 03/11/2021   (HFpEF) heart failure with preserved ejection fraction ([place])    Chronic respiratory failure with hypoxia ([place])    Hyperlipidemia 02/19/2021   Acute respiratory distress syndrome (ARDS) due to COVID-19 virus ([place])    Acute on chronic respiratory failure ([place]) 02/18/2021    Generalized osteoarthritis of multiple sites 10/31/2020   AKI (acute kidney injury) ([place]) 03/04/2020   Hyperkalemia 03/04/2020   COPD with acute exacerbation ([place]) 03/02/2020   Marijuana use 01/17/2020   Thrombocytopenia ([place]) 01/17/2020   Positive hepatitis C antibody test 01/17/2020   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 mass index (BMI) of 50.0 to 59.9 in adult [facility]) 01/17/2020   Osteoarthritis of glenohumeral joints (Bilateral) 01/17/2020   Osteoarthritis of AC (acromioclavicular) joints (Bilateral) 01/17/2020   Polysubstance abuse ([place]) 96/78/9381   Toxic metabolic encephalopathy 01/75/1025   Chronic pain syndrome 07/14/2019   DDD (degenerative disc disease), lumbosacral 07/14/2019   DDD (degenerative disc disease), cervical 07/14/2019   CHF (congestive heart failure) ([place]) 04/12/2017   Acute on chronic diastolic CHF (congestive heart failure) ([place]) 02/28/2015   Obstructive sleep apnea 02/28/2015   Acute on chronic diastolic congestive heart failure ([name])    Acute on chronic respiratory failure with hypoxia ([name])    Obesity, Class III, BMI 40-49.9 (morbid obesity) ([place]) 04/01/2014   HTN (hypertension) 04/01/2014    Orientation RESPIRATION BLADDER Height & Weight     Self, Time, Situation, Place  Tracheostomy (Trach collar 28% Fio2 during the day- Vent Trilogy at night) External catheter Weight:  (pt refused wt check) Height:  '6\' 3"'$  (190.5 cm)  BEHAVIORAL SYMPTOMS/MOOD NEUROLOGICAL BOWEL NUTRITION STATUS      Continent Diet (Heart Healthy)  AMBULATORY STATUS COMMUNICATION OF NEEDS Skin   Extensive Assist Verbally Normal                       Personal Care Assistance Level of Assistance  Bathing, Feeding, Dressing, Total care Bathing Assistance: Maximum assistance Feeding assistance: Independent Dressing Assistance: Maximum assistance Total Care Assistance: Maximum assistance   Functional Limitations Info  Sight, Hearing, Speech Sight Info:  Adequate Hearing Info: Adequate Speech Info: Adequate    SPECIAL CARE FACTORS FREQUENCY  PT (By licensed PT), OT (By licensed OT)     PT Frequency: 5 times per week OT Frequency: 5 times per week            Contractures Contractures Info: Not present    Additional Factors Info  Code Status, Allergies Code Status Info: Full Allergies Info: NKA Psychotropic Info: Gabapentin every 8 hours         Current Medications (02/28/2022):  This is the current hospital active medication list Current Facility-Administered Medications  Medication Dose Route Frequency Provider Last Rate Last Admin   0.9 %  sodium chloride infusion   Intravenous PRN Enzo Bi, MD   Stopped at 02/26/22 1159   acetaminophen (TYLENOL) tablet 650 mg  650 mg Oral Q6H PRN Mansy, Jan A, MD   650 mg at 02/27/22 1224   Or   acetaminophen (TYLENOL) suppository 650 mg  650 mg Rectal Q6H PRN Mansy, Jan A, MD       ammonium lactate (LAC-HYDRIN) 12 % lotion   Topical BID Val Riles, MD   Given at 02/26/22 0951   apixaban (ELIQUIS) tablet 2.5 mg  2.5 mg Oral BID Duffy Bruce, MD   2.5 mg at 02/28/22 1010   benzonatate (TESSALON) capsule 100 mg  100 mg Oral TID PRN Duffy Bruce, MD   100 mg at 02/02/22 1039   bisacodyl (DULCOLAX) suppository 10 mg  10 mg Rectal Daily PRN Mansy, Jan A, MD       Chlorhexidine Gluconate Cloth 2 % PADS 6 each  6 each Topical Q0600 Val Riles, MD   6 each at 02/26/22 1006   clonazePAM (KLONOPIN) disintegrating tablet 0.5 mg  0.5 mg Oral BID PRN Duffy Bruce, MD   0.5 mg at 02/27/22 2126   cyanocobalamin (VITAMIN B12) tablet 500 mcg  500 mcg Oral Daily Val Riles, MD   500 mcg at 02/28/22 1009   diclofenac Sodium (VOLTAREN) 1 % topical gel 2 g  2 g Topical TID Nance Pear, MD   2 g at 02/26/22 1608   escitalopram (LEXAPRO) tablet 5 mg  5 mg Oral Daily Duffy Bruce, MD   5 mg at 02/28/22 1010   ferrous sulfate tablet 325 mg  325 mg Oral BID WC Duffy Bruce, MD   325  mg at 02/28/22 1011   gabapentin (NEURONTIN) capsule 200 mg  200 mg Oral TID Enzo Bi, MD   200 mg at 02/28/22 1009   guaiFENesin (MUCINEX) 12 hr tablet 600 mg  600 mg Oral BID Val Riles, MD   600 mg at 02/28/22 1010   Ipratropium-Albuterol (COMBIVENT) respimat 2 puff  2 puff Inhalation BID Duffy Bruce, MD   2 puff at 02/28/22 1010   ipratropium-albuterol (DUONEB) 0.5-2.5 (3) MG/3ML nebulizer solution 3 mL  3 mL Nebulization Q4H PRN Lorella Nimrod, MD   3 mL at 02/20/22 1816   magnesium hydroxide (MILK OF MAGNESIA) suspension 30 mL  30 mL Oral Daily PRN Mansy, Jan A, MD       melatonin tablet 5 mg  5 mg Oral QHS Duffy Bruce, MD   5 mg at 02/27/22 2126   ondansetron (ZOFRAN) tablet 4 mg  4 mg Oral Q6H PRN Mansy, Jan A,  MD       Or   ondansetron (ZOFRAN) injection 4 mg  4 mg Intravenous Q6H PRN Mansy, Arvella Merles, MD       Oral care mouth rinse  15 mL Mouth Rinse 4 times per day Lorella Nimrod, MD   15 mL at 02/27/22 0830   Oral care mouth rinse  15 mL Mouth Rinse PRN Lorella Nimrod, MD       pantoprazole (PROTONIX) EC tablet 40 mg  40 mg Oral Daily [name], MD   40 mg at 02/28/22 1009   polyethylene glycol (MIRALAX / GLYCOLAX) packet 17 g  17 g Oral Daily PRN Mansy, Jan A, MD       potassium chloride SA (KLOR-CON M) CR tablet 10 mEq  10 mEq Oral Daily Duffy Bruce, MD   10 mEq at 02/28/22 1009   simvastatin (ZOCOR) tablet 10 mg  10 mg Oral q1800 Duffy Bruce, MD   10 mg at 02/27/22 1713   torsemide (DEMADEX) tablet 20 mg  20 mg Oral Daily Fritzi Mandes, MD   20 mg at 02/28/22 1010   traZODone (DESYREL) tablet 25 mg  25 mg Oral QHS PRN Mansy, Jan A, MD   25 mg at 02/22/22 2314     Discharge Medications: Please see discharge summary for a list of discharge medications.  Relevant Imaging Results:  Relevant Lab Results:   Additional Information SSN [id number]  Shelbie Hutching, RN

## 2022-03-01 DIAGNOSIS — I5033 Acute on chronic diastolic (congestive) heart failure: Secondary | ICD-10-CM | POA: Diagnosis not present

## 2022-03-01 DIAGNOSIS — J449 Chronic obstructive pulmonary disease, unspecified: Secondary | ICD-10-CM | POA: Diagnosis not present

## 2022-03-01 DIAGNOSIS — J9621 Acute and chronic respiratory failure with hypoxia: Secondary | ICD-10-CM | POA: Diagnosis not present

## 2022-03-01 NOTE — Progress Notes (Signed)
  Progress Note   Patient: Gary [name]:[id number] DOB: 1955/08/11 DOA: 01/23/2022     26 DOS: the patient was seen and examined on 03/01/2022   Brief hospital course:  Gary [name] is a 66 y.o. male with medical history significant for diastolic CHF, chronic respiratory failure status post tracheostomy, COPD, essential hypertension, PUD and sleep apnea who presented to the emergency room with acute onset of worsening dyspnea.  Apparently patient has been in the ER for the past 10 days as he was unable to be cared at home specially requirement of tracheostomy care, awaiting placement. He developed worsening pulmonary edema. Being admitted for management of acute on chronic diastolic heart failure and given IV Lasix.  Patient condition has gradually improved.  He was able to tolerate nocturnal trilogy ventilation.  Currently pending nursing placement.  Assessment and Plan: Acute on chronic diastolic (congestive) heart failure   Acute on chronic respiratory failure with hypoxemia and hypercapnia   Advanced COPD Tracheostomy tube dependence Condition had improved, currently on oral torsemide.  Trach exchange performed 11/14.  Tolerating trilogy.  AKI (acute kidney injury) Caused by diuretics, renal function back to baseline.   GERD without esophagitis On PPI.   Obesity, Class III, BMI 40-49.9 (morbid obesity) ([place]) Estimated body mass index is 48.12 kg/m as calculated from the following:   Height as of this encounter: '6\' 3"'$  (1.905 m).   Weight as of this encounter: 174.6 kg.    Anxiety and depression On Lexapro.   Dyslipidemia --continue statin therapy.   Microcytic Iron def anemia --cont iron supplement --s/p IV iron x3 doses   Right knee pain 2/2 Lateral and medial meniscus tears --per MRI right knee.   --ortho consulted, most likely require a total knee replacement to address both the meniscal pathology and the degenerative changes, however, pt currently is not a  surgical candidate. --s/p Kenalog injection on 11/10 with improved symptoms.   Hypokalemia Improved      Subjective:  Patient is doing well. No shortness of breath. Able to walk for a few steps with assist  Physical Exam: Vitals:   03/01/22 0442 03/01/22 0800 03/01/22 1318 03/01/22 1321  BP:    109/69  Pulse:   85   Resp:  20 (!) 25 (!) 22  Temp:    98.6 F (37 C)  TempSrc:    Oral  SpO2:   98%   Weight: (!) 181.3 kg     Height:       General exam: Appears calm and comfortable, morbid obese  Respiratory system: decreased breathing sounds. Respiratory effort normal. Cardiovascular system: S1 & S2 heard, RRR. No JVD, murmurs, rubs, gallops or clicks. No pedal edema. Gastrointestinal system: Abdomen is nondistended, soft and nontender. No organomegaly or masses felt. Normal bowel sounds heard. Central nervous system: Alert and oriented. No focal neurological deficits. Extremities: Symmetric 5 x 5 power. Skin: No rashes, lesions or ulcers Psychiatry: Judgement and insight appear normal. Mood & affect appropriate.   Data Reviewed:  Lab results reviewed  Family Communication: None  Disposition: Status is: Inpatient Remains inpatient appropriate because: unsafe discharge  Planned Discharge Destination: Skilled nursing facility    Time spent: 35 minutes  Author: Sharen Hones, MD 03/01/2022 2:34 PM  For on call review [email or web address].

## 2022-03-01 NOTE — Progress Notes (Signed)
Occupational Therapy Treatment Patient Details Name: Gary [name] MRN: [id number] DOB: 05-11-1955 Today's Date: 03/01/2022   History of present illness Pt is a 66 y.o. 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  Prolonged admittances earlier this year for similar. MD assessment includes: Acute on chronic diastolic CHF, AKI, and acute on chronic respiratory failure with hypoxia.   OT comments  Mr Behan was seen for OT/PT co-treatment on this date. Upon arrival to room pt seated EOB with PT, agreeable to tx. Pt requires MIN A x2 + RW sit<>stand at EOB for 2 attempts, tolerated 60 sec each attempt and 1 side step. Discussed plan with pt and RN for eating meals seated EOB. Pt making good progress toward goals, will continue to follow POC. Discharge recommendation remains appropriate.     Recommendations for follow up therapy are one component of a multi-disciplinary discharge planning process, led by the attending physician.  Recommendations may be updated based on patient status, additional functional criteria and insurance authorization.    Follow Up Recommendations  Skilled nursing-short term rehab (<3 hours/day)     Assistance Recommended at Discharge Frequent or constant Supervision/Assistance  Patient can return home with the following  Two people to help with walking and/or transfers;Two people to help with bathing/dressing/bathroom   Equipment Recommendations  Hospital bed    Recommendations for Other Services      Precautions / Restrictions Precautions Precautions: Fall Restrictions Weight Bearing Restrictions: No RLE Weight Bearing: Weight bearing as tolerated       Mobility Bed Mobility Overal bed mobility: Needs Assistance Bed Mobility: Supine to Sit, Sit to Supine     Supine to sit: Min guard, HOB elevated Sit to supine: Max assist   General  bed mobility comments: assistance for BLE support to return to bed. verbal cues for sequencing and technique    Transfers Overall transfer level: Needs assistance Equipment used: Rolling walker (2 wheels) (green bariatric rolling walker) Transfers: Sit to/from Stand Sit to Stand: Min assist, +2 physical assistance, +2 safety/equipment, From elevated surface           General transfer comment: bed height elevated but in a lower position than yesterday. cues for technique, anterior weight shifting     Balance Overall balance assessment: Needs assistance Sitting-balance support: Feet supported Sitting balance-[name] Scale: Good Sitting balance - Comments: supervision for safety   Standing balance support: Bilateral upper extremity supported, Reliant on assistive device for balance Standing balance-[name] Scale: Poor Standing balance comment: faciliation for anterior weight shifitng initially                           ADL either performed or assessed with clinical judgement   ADL Overall ADL's : Needs assistance/impaired                                       General ADL Comments: MIN A x2 + RW for functional mobility,attempted 1 side step. MAX A don B shoes in sitting. Discussed plan for eating meals seated EOB.      Cognition Arousal/Alertness: Awake/alert Behavior During Therapy: WFL for tasks assessed/performed Overall Cognitive Status: Within Functional Limits for tasks assessed  Pertinent Vitals/ Pain       Pain Assessment Pain Assessment: Faces Faces Pain Scale: Hurts a little bit Pain Location: L knee Pain Descriptors / Indicators: Discomfort Pain Intervention(s): Limited activity within patient's tolerance   Frequency  Min 2X/week        Progress Toward Goals  OT Goals(current goals can now be found in the care plan section)  Progress towards OT goals:  Progressing toward goals  Acute Rehab OT Goals Patient Stated Goal: to go home OT Goal Formulation: With patient Time For Goal Achievement: 03/07/22 Potential to Achieve Goals: Fair ADL Goals Pt Will Perform Grooming: with modified independence;sitting Pt Will Perform Lower Body Dressing: sitting/lateral leans;with min assist Pt Will Transfer to Toilet: with min assist;bedside commode;stand pivot transfer  Plan Discharge plan remains appropriate;Frequency remains appropriate    Co-evaluation    PT/OT/SLP Co-Evaluation/Treatment: Yes Reason for Co-Treatment: Complexity of the patient's impairments (multi-system involvement) PT goals addressed during session: Mobility/safety with mobility OT goals addressed during session: ADL's and self-care      AM-PAC OT "6 Clicks" Daily Activity     Outcome Measure   Help from another person eating meals?: None Help from another person taking care of personal grooming?: A Little Help from another person toileting, which includes using toliet, bedpan, or urinal?: A Lot Help from another person bathing (including washing, rinsing, drying)?: A Lot Help from another person to put on and taking off regular upper body clothing?: A Little Help from another person to put on and taking off regular lower body clothing?: A Lot 6 Click Score: 16    End of Session Equipment Utilized During Treatment: Rolling walker (2 wheels)  OT Visit Diagnosis: Other abnormalities of gait and mobility (R26.89);Muscle weakness (generalized) (M62.81)   Activity Tolerance Patient tolerated treatment well   Patient Left in bed;with call bell/phone within reach   Nurse Communication Mobility status        Time: 7672-0947 OT Time Calculation (min): 16 min  Charges: OT General Charges $OT Visit: 1 Visit OT Treatments $Therapeutic Activity: 8-22 mins  [name], M.S. OTR/L  03/01/22, 3:30 PM  ascom [phone]

## 2022-03-01 NOTE — Progress Notes (Signed)
Physical Therapy Treatment Patient Details Name: Gary [name] MRN: [id number] DOB: [birth year] Today's Date: 03/01/2022   History of Present Illness Pt is a 66 y.o. 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  Prolonged admittances earlier this year for similar. MD assessment includes: Acute on chronic diastolic CHF, AKI, and acute on chronic respiratory failure with hypoxia.    PT Comments    Patient was agreeable to PT. Transfer training performed with standing x 2 bouts with +2 person assistance with bed height elevated. He was able to stand for 60 seconds, seated rest break, and then 65 seconds. Pre-gait activity performed with weight shifting and patient was able to take one small side step to the right with RLE. Difficulty with weight acceptance on RLE for movement of LLE. No knee buckling with standing. Recommend to continue PT to maximize independence and facilitate return to prior level of function. SNF recommended at discharge.    Recommendations for follow up therapy are one component of a multi-disciplinary discharge planning process, led by the attending physician.  Recommendations may be updated based on patient status, additional functional criteria and insurance authorization.  Follow Up Recommendations  Skilled nursing-short term rehab (<3 hours/day) Can patient physically be transported by private vehicle: No   Assistance Recommended at Discharge Frequent or constant Supervision/Assistance  Patient can return home with the following Two people to help with walking and/or transfers;Two people to help with bathing/dressing/bathroom;Assistance with cooking/housework;Assist for transportation;Help with stairs or ramp for entrance   Equipment Recommendations  None recommended by PT    Recommendations for Other Services       Precautions / Restrictions  Precautions Precautions: Fall Restrictions Weight Bearing Restrictions: No RLE Weight Bearing: Weight bearing as tolerated     Mobility  Bed Mobility Overal bed mobility: Needs Assistance Bed Mobility: Supine to Sit, Sit to Supine     Supine to sit: Min guard, HOB elevated Sit to supine: Max assist   General bed mobility comments: assistance for BLE support to return to bed. verbal cues for sequencing and technique    Transfers Overall transfer level: Needs assistance Equipment used: Rolling walker (2 wheels) (green bariatric rolling walker) Transfers: Sit to/from Stand Sit to Stand: Min assist, +2 physical assistance, +2 safety/equipment, From elevated surface           General transfer comment: bed height elevated but in a lower position than yesterday. cues for technique, anterior weight shifting    Ambulation/Gait             Pre-gait activities: standing tolerance of 60 seconds and then 65 seconds. cues for weight shifitng and patient able to take one side step  to the right with RLE. unable to weight shift to the right for movement of the LLE     Stairs             Wheelchair Mobility    Modified Rankin (Stroke Patients Only)       Balance Overall balance assessment: Needs assistance Sitting-balance support: Feet supported Sitting balance-[name] Scale: Good Sitting balance - Comments: supervision for safety   Standing balance support: Bilateral upper extremity supported, Reliant on assistive device for balance Standing balance-[name] Scale: Poor Standing balance comment: faciliation for anterior weight shifitng initially  Cognition Arousal/Alertness: Awake/alert Behavior During Therapy: WFL for tasks assessed/performed Overall Cognitive Status: Within Functional Limits for tasks assessed                                          Exercises      General Comments        Pertinent  Vitals/Pain Pain Assessment Pain Assessment: Faces Faces Pain Scale: Hurts a little bit Pain Location: L knee Pain Descriptors / Indicators: Discomfort Pain Intervention(s): Limited activity within patient's tolerance, Monitored during session    Home Living                          Prior Function            PT Goals (current goals can now be found in the care plan section) Acute Rehab PT Goals Patient Stated Goal: to be able to walk PT Goal Formulation: With patient Time For Goal Achievement: 03/11/22 Potential to Achieve Goals: Fair Progress towards PT goals: Progressing toward goals    Frequency    Min 2X/week      PT Plan Current plan remains appropriate    Co-evaluation PT/OT/SLP Co-Evaluation/Treatment: Yes Reason for Co-Treatment: Complexity of the patient's impairments (multi-system involvement);For patient/therapist safety;To address functional/ADL transfers PT goals addressed during session: Mobility/safety with mobility        AM-PAC PT "6 Clicks" Mobility   Outcome Measure  Help needed turning from your back to your side while in a flat bed without using bedrails?: A Lot Help needed moving from lying on your back to sitting on the side of a flat bed without using bedrails?: A Lot Help needed moving to and from a bed to a chair (including a wheelchair)?: Total Help needed standing up from a chair using your arms (e.g., wheelchair or bedside chair)?: Total Help needed to walk in hospital room?: Total Help needed climbing 3-5 steps with a railing? : Total 6 Click Score: 8    End of Session Equipment Utilized During Treatment: Other (comment) (trach collar, PMSV) Activity Tolerance: Patient tolerated treatment well Patient left: in bed;with call bell/phone within reach Nurse Communication: Mobility status PT Visit Diagnosis: Unsteadiness on feet (R26.81);Muscle weakness (generalized) (M62.81);Difficulty in walking, not elsewhere classified  (R26.2)     Time: 8413-2440 PT Time Calculation (min) (ACUTE ONLY): 24 min  Charges:  $Therapeutic Activity: 8-22 mins                     Gary [name], PT, MPT    [name] 03/01/2022, 2:28 PM

## 2022-03-01 NOTE — Anesthesia Postprocedure Evaluation (Signed)
Anesthesia Post Note  Patient: Gary [name]  Procedure(s) Performed: TRACHEOSTOMY TUBE CHANGE, CAUTERIZATION OF GRANULATION TISSUE  Patient location during evaluation: ICU Anesthesia Type: General Level of consciousness: awake and alert Pain management: pain level controlled Vital Signs Assessment: post-procedure vital signs reviewed and stable Respiratory status: patient connected to tracheostomy mask oxygen Cardiovascular status: stable Postop Assessment: no apparent nausea or vomiting Anesthetic complications: no   No notable events documented.   Last Vitals:  Vitals:   02/28/22 2342 03/01/22 0400  BP:  121/83  Pulse:    Resp:  18  Temp:  37 C  SpO2: 96% 95%    Last Pain:  Vitals:   03/01/22 0600  TempSrc:   PainSc: Asleep                 Norm Salt

## 2022-03-02 DIAGNOSIS — J9621 Acute and chronic respiratory failure with hypoxia: Secondary | ICD-10-CM | POA: Diagnosis not present

## 2022-03-02 DIAGNOSIS — J449 Chronic obstructive pulmonary disease, unspecified: Secondary | ICD-10-CM | POA: Diagnosis not present

## 2022-03-02 DIAGNOSIS — I5033 Acute on chronic diastolic (congestive) heart failure: Secondary | ICD-10-CM | POA: Diagnosis not present

## 2022-03-02 LAB — CBC
HCT: 34 % — ABNORMAL LOW (ref 39.0–52.0)
Hemoglobin: 9.4 g/dL — ABNORMAL LOW (ref 13.0–17.0)
MCH: 21.2 pg — ABNORMAL LOW (ref 26.0–34.0)
MCHC: 27.6 g/dL — ABNORMAL LOW (ref 30.0–36.0)
MCV: 76.6 fL — ABNORMAL LOW (ref 80.0–100.0)
Platelets: 181 10*3/uL (ref 150–400)
RBC: 4.44 MIL/uL (ref 4.22–5.81)
RDW: 18.6 % — ABNORMAL HIGH (ref 11.5–15.5)
WBC: 9.9 10*3/uL (ref 4.0–10.5)
nRBC: 0 % (ref 0.0–0.2)

## 2022-03-02 LAB — BASIC METABOLIC PANEL
Anion gap: 6 (ref 5–15)
BUN: 43 mg/dL — ABNORMAL HIGH (ref 8–23)
CO2: 28 mmol/L (ref 22–32)
Calcium: 8.5 mg/dL — ABNORMAL LOW (ref 8.9–10.3)
Chloride: 107 mmol/L (ref 98–111)
Creatinine, Ser: 1.24 mg/dL (ref 0.61–1.24)
GFR, Estimated: >60 mL/min (ref >60)
Glucose, Bld: 120 mg/dL — ABNORMAL HIGH (ref 70–99)
Potassium: 4.7 mmol/L (ref 3.5–5.1)
Sodium: 141 mmol/L (ref 135–145)

## 2022-03-02 LAB — MAGNESIUM: Magnesium: 2.2 mg/dL (ref 1.7–2.4)

## 2022-03-02 NOTE — Progress Notes (Signed)
  Progress Note   Patient: Gary [name] UVO:[id number] DOB: 08/28/1955 DOA: 01/23/2022     27 DOS: the patient was seen and examined on 03/02/2022   Brief hospital course:  Gary [name] is a 66 y.o. male with medical history significant for diastolic CHF, chronic respiratory failure status post tracheostomy, COPD, essential hypertension, PUD and sleep apnea who presented to the emergency room with acute onset of worsening dyspnea.  Apparently patient has been in the ER for the past 10 days as he was unable to be cared at home specially requirement of tracheostomy care, awaiting placement. He developed worsening pulmonary edema. Being admitted for management of acute on chronic diastolic heart failure and given IV Lasix.  Patient condition has gradually improved.  He was able to tolerate nocturnal trilogy ventilation.  Currently pending nursing placement.  Assessment and Plan:  Acute on chronic diastolic (congestive) heart failure   Acute on chronic respiratory failure with hypoxemia and hypercapnia   Advanced COPD Tracheostomy tube dependence Condition had improved, currently on oral torsemide.  Trach exchange performed 11/14. On trilogy at night, tolerating well.  No new issues.  Pending nursing placement.   AKI (acute kidney injury) Caused by diuretics, renal function back to baseline.   GERD without esophagitis On PPI.   Obesity, Class III, BMI 40-49.9 (morbid obesity) ([place]) Estimated body mass index is 48.12 kg/m as calculated from the following:   Height as of this encounter: '6\' 3"'$  (1.905 m).   Weight as of this encounter: 174.6 kg.    Anxiety and depression On Lexapro.   Dyslipidemia --continue statin therapy.   Microcytic Iron def anemia --cont iron supplement --s/p IV iron x3 doses   Right knee pain 2/2 Lateral and medial meniscus tears --per MRI right knee.   --ortho consulted, most likely require a total knee replacement to address both the meniscal pathology  and the degenerative changes, however, pt currently is not a surgical candidate. --s/p Kenalog injection on 11/10 with improved symptoms.   Hypokalemia Improved     Subjective:  Patient doing well today, tolerating trilogy.  Physical Exam: Vitals:   03/01/22 1945 03/02/22 0115 03/02/22 0352 03/02/22 0625  BP: 119/76 109/74  110/78  Pulse: 85   69  Resp: 19 (!) 21  18  Temp: 99 F (37.2 C) 99 F (37.2 C)    TempSrc: Oral Axillary    SpO2: 98% 100%  100%  Weight:   (!) 181.4 kg   Height:       General exam: Appears calm and comfortable, morbid obese Respiratory system: Clear to auscultation. Respiratory effort normal. Cardiovascular system: S1 & S2 heard, RRR. No JVD, murmurs, rubs, gallops or clicks. No pedal edema. Gastrointestinal system: Abdomen is nondistended, soft and nontender. No organomegaly or masses felt. Normal bowel sounds heard. Central nervous system: Alert and oriented. No focal neurological deficits. Extremities: Symmetric 5 x 5 power. Skin: No rashes, lesions or ulcers Psychiatry: Judgement and insight appear normal. Mood & affect appropriate.   Data Reviewed:  Lab reviewed  Family Communication: None  Disposition: Status is: Inpatient Remains inpatient appropriate because: Unsafe discharge  Planned Discharge Destination: Skilled nursing facility    Time spent: 30 minutes  Author: Sharen Hones, MD 03/02/2022 12:31 PM  For on call review [email or web address].

## 2022-03-02 NOTE — Consult Note (Signed)
[facility] for Electrolyte Monitoring and Replacement   Recent Labs: Potassium (mmol/L)  Date Value  03/02/2022 4.7  04/15/2014 3.9   Magnesium (mg/dL)  Date Value  03/02/2022 2.2  08/21/2012 1.9   Calcium (mg/dL)  Date Value  03/02/2022 8.5 (L)   Calcium, Total (mg/dL)  Date Value  04/15/2014 8.3 (L)   Albumin (g/dL)  Date Value  01/23/2022 3.1 (L)  04/15/2014 2.9 (L)   Phosphorus (mg/dL)  Date Value  02/12/2022 3.1   Sodium (mmol/L)  Date Value  03/02/2022 141  03/15/2020 145 (H)  04/15/2014 140     Assessment: 66 y.o. male with medical history significant for diastolic CHF, chronic respiratory failure status post tracheostomy tube placement 09/2021, advanced COPD, essential hypertension, PUD, sleep apnea, multiple hospitalizations who presented to the emergency room with acute onset of worsening dyspnea.     Diuretics: torsemide 20 mg daily.  --on oral KCl 10 mEq once daily  Goal of Therapy:  Electrolytes WNL  Plan:  ---continue oral KCl 10 mEq once daily  ---recheck electrolytes on 03/06/22 (given chronic nature of visit, will recheck intermittently as long as stable)   Dallie Piles ,PharmD Clinical Pharmacist 03/02/2022 8:12 AM;e

## 2022-03-02 NOTE — Progress Notes (Signed)
Occupational Therapy Treatment Patient Details Name: Gary [name] MRN: [id number] DOB: [birth year] Today's Date: 03/02/2022   History of present illness Pt is a 66 y.o. 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  Prolonged admittances earlier this year for similar. MD assessment includes: Acute on chronic diastolic CHF, AKI, and acute on chronic respiratory failure with hypoxia.   OT comments  Gary [name] was seen for OT/PT co-treatment on this date. Upon arrival to room pt reclined in bed, pleasant and agreeable to tx. Pt continues to require no physical assist ext bed, MAX A for BLE assist to return to bed. MIN A x2 + RW for x3 sit<>stand at elevated bed height. Reinforced plan for eating meals seated EOB, pt reports request to hoyer OOB to chair to watch football game tmrw. Pt making good progress toward goals, will continue to follow POC. Discharge recommendation remains appropriate.     Recommendations for follow up therapy are one component of a multi-disciplinary discharge planning process, led by the attending physician.  Recommendations may be updated based on patient status, additional functional criteria and insurance authorization.    Follow Up Recommendations  Skilled nursing-short term rehab (<3 hours/day)     Assistance Recommended at Discharge Frequent or constant Supervision/Assistance  Patient can return home with the following  Two people to help with walking and/or transfers;Two people to help with bathing/dressing/bathroom   Equipment Recommendations  Hospital bed    Recommendations for Other Services      Precautions / Restrictions Precautions Precautions: Fall Restrictions Weight Bearing Restrictions: No RLE Weight Bearing: Weight bearing as tolerated       Mobility Bed Mobility Overal bed mobility: Needs Assistance Bed Mobility: Supine to  Sit, Sit to Supine     Supine to sit: Min guard, HOB elevated Sit to supine: Max assist        Transfers Overall transfer level: Needs assistance Equipment used: Rolling walker (2 wheels) Transfers: Sit to/from Stand Sit to Stand: Min assist, +2 physical assistance, +2 safety/equipment, From elevated surface           General transfer comment: x3 stands     Balance Overall balance assessment: Needs assistance Sitting-balance support: Feet supported Sitting balance-[name] Scale: Good     Standing balance support: Bilateral upper extremity supported, Reliant on assistive device for balance Standing balance-[name] Scale: Poor                             ADL either performed or assessed with clinical judgement   ADL Overall ADL's : Needs assistance/impaired                                       General ADL Comments: MIN A x2 + RW for functional mobility. MAX A don B shoes in sitting. Reinforced plan for eating meals seated EOB.      Cognition Arousal/Alertness: Awake/alert Behavior During Therapy: WFL for tasks assessed/performed Overall Cognitive Status: Within Functional Limits for tasks assessed                                 General Comments: pt identifies technique for improved mobility  Pertinent Vitals/ Pain       Pain Assessment Pain Assessment: No/denies pain   Frequency  Min 2X/week        Progress Toward Goals  OT Goals(current goals can now be found in the care plan section)  Progress towards OT goals: Progressing toward goals  Acute Rehab OT Goals Patient Stated Goal: to walk OT Goal Formulation: With patient Time For Goal Achievement: 03/07/22 Potential to Achieve Goals: Fair ADL Goals Pt Will Perform Grooming: with modified independence;sitting Pt Will Perform Lower Body Dressing: sitting/lateral leans;with min assist Pt Will Transfer to Toilet: with min assist;bedside  commode;stand pivot transfer  Plan Discharge plan remains appropriate;Frequency remains appropriate    Co-evaluation    PT/OT/SLP Co-Evaluation/Treatment: Yes Reason for Co-Treatment: To address functional/ADL transfers;For patient/therapist safety PT goals addressed during session: Mobility/safety with mobility OT goals addressed during session: ADL's and self-care      AM-PAC OT "6 Clicks" Daily Activity     Outcome Measure   Help from another person eating meals?: None Help from another person taking care of personal grooming?: A Little Help from another person toileting, which includes using toliet, bedpan, or urinal?: A Lot Help from another person bathing (including washing, rinsing, drying)?: A Lot Help from another person to put on and taking off regular upper body clothing?: A Little Help from another person to put on and taking off regular lower body clothing?: A Lot 6 Click Score: 16    End of Session    OT Visit Diagnosis: Other abnormalities of gait and mobility (R26.89);Muscle weakness (generalized) (M62.81)   Activity Tolerance Patient tolerated treatment well   Patient Left in bed;with call bell/phone within reach   Nurse Communication Mobility status        Time: 1315-1340 OT Time Calculation (min): 25 min  Charges: OT General Charges $OT Visit: 1 Visit OT Treatments $Self Care/Home Management : 8-22 mins  [name], M.S. OTR/L  03/02/22, 1:49 PM  ascom [phone]

## 2022-03-02 NOTE — Progress Notes (Signed)
Physical Therapy Treatment Patient Details Name: Gary [name] MRN: [id number] DOB: May 09, 1955 Today's Date: 03/02/2022   History of Present Illness Pt is a 66 y.o. 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  Prolonged admittances earlier this year for similar. MD assessment includes: Acute on chronic diastolic CHF, AKI, and acute on chronic respiratory failure with hypoxia.   PT Comments    Patient is agreeable to PT. Patient performed 3 standing bouts with Min A +2 person from elevated surface. Standing tolerance of 45 seconds, 30 seconds, and 30 seconds with a goal of standing at least 2 minutes in preparation for ambulation. Patient unable to take steps today. Recommend to continue PT to maximize independence and facilitate return to prior level of function. SNF recommended at discharge.    Recommendations for follow up therapy are one component of a multi-disciplinary discharge planning process, led by the attending physician.  Recommendations may be updated based on patient status, additional functional criteria and insurance authorization.  Follow Up Recommendations  Skilled nursing-short term rehab (<3 hours/day) Can patient physically be transported by private vehicle: No   Assistance Recommended at Discharge Frequent or constant Supervision/Assistance  Patient can return home with the following Two people to help with walking and/or transfers;Two people to help with bathing/dressing/bathroom;Assistance with cooking/housework;Assist for transportation;Help with stairs or ramp for entrance   Equipment Recommendations  None recommended by PT    Recommendations for Other Services       Precautions / Restrictions Precautions Precautions: Fall Restrictions Weight Bearing Restrictions: No RLE Weight Bearing: Weight bearing as tolerated     Mobility  Bed  Mobility Overal bed mobility: Needs Assistance Bed Mobility: Supine to Sit, Sit to Supine Rolling: Min guard   Supine to sit: Min guard, HOB elevated Sit to supine: Max assist   General bed mobility comments: assistance for BLE support to return to bed. verbal cues for sequencing and technique    Transfers Overall transfer level: Needs assistance Equipment used: Rolling walker (2 wheels) Transfers: Sit to/from Stand Sit to Stand: Min assist, +2 physical assistance, +2 safety/equipment, From elevated surface           General transfer comment: 3 bouts of sit to stand performed. verbal cues for technique including anterior weight shifting and LE foot positioning.    Ambulation/Gait             Pre-gait activities: emphasis on increasing standing tolerance, maintaining erect standing posture. standing tolerance of 45 seconds, 30 seconds, 30 seconds     Stairs             Wheelchair Mobility    Modified Rankin (Stroke Patients Only)       Balance Overall balance assessment: Needs assistance Sitting-balance support: Feet supported Sitting balance-[name] Scale: Good Sitting balance - Comments: supervision for safety   Standing balance support: Bilateral upper extremity supported, Reliant on assistive device for balance Standing balance-[name] Scale: Poor Standing balance comment: faciliation for anterior weight shifitng initially                            Cognition Arousal/Alertness: Awake/alert Behavior During Therapy: WFL for tasks assessed/performed Overall Cognitive Status: Within Functional Limits for tasks assessed  Exercises      General Comments        Pertinent Vitals/Pain Pain Assessment Pain Assessment: No/denies pain    Home Living                          Prior Function            PT Goals (current goals can now be found in the care plan section)  Acute Rehab PT Goals Patient Stated Goal: to be able to walk PT Goal Formulation: With patient Time For Goal Achievement: 03/11/22 Potential to Achieve Goals: Fair Progress towards PT goals: Progressing toward goals    Frequency    Min 2X/week      PT Plan Current plan remains appropriate    Co-evaluation PT/OT/SLP Co-Evaluation/Treatment: Yes Reason for Co-Treatment: To address functional/ADL transfers;For patient/therapist safety PT goals addressed during session: Mobility/safety with mobility OT goals addressed during session: ADL's and self-care      AM-PAC PT "6 Clicks" Mobility   Outcome Measure  Help needed turning from your back to your side while in a flat bed without using bedrails?: A Lot Help needed moving from lying on your back to sitting on the side of a flat bed without using bedrails?: A Lot Help needed moving to and from a bed to a chair (including a wheelchair)?: Total Help needed standing up from a chair using your arms (e.g., wheelchair or bedside chair)?: Total Help needed to walk in hospital room?: Total Help needed climbing 3-5 steps with a railing? : Total 6 Click Score: 8    End of Session Equipment Utilized During Treatment: Other (comment) (trach collar) Activity Tolerance: Patient tolerated treatment well Patient left: in bed;with call bell/phone within reach Nurse Communication: Mobility status PT Visit Diagnosis: Unsteadiness on feet (R26.81);Muscle weakness (generalized) (M62.81);Difficulty in walking, not elsewhere classified (R26.2)     Time: 1315-1340 PT Time Calculation (min) (ACUTE ONLY): 25 min  Charges:  $Therapeutic Activity: 8-22 mins                     [name], PT, MPT    [name] 03/02/2022, 2:00 PM

## 2022-03-03 DIAGNOSIS — I5033 Acute on chronic diastolic (congestive) heart failure: Secondary | ICD-10-CM | POA: Diagnosis not present

## 2022-03-03 DIAGNOSIS — N179 Acute kidney failure, unspecified: Secondary | ICD-10-CM | POA: Diagnosis not present

## 2022-03-03 DIAGNOSIS — J9621 Acute and chronic respiratory failure with hypoxia: Secondary | ICD-10-CM | POA: Diagnosis not present

## 2022-03-03 NOTE — Progress Notes (Signed)
  Progress Note   Patient: Gary [name] DOB: Jul 23, 1955 DOA: 01/23/2022     28 DOS: the patient was seen and examined on 03/03/2022   Brief hospital course:  Gary [name] is a 66 y.o. male with medical history significant for diastolic CHF, chronic respiratory failure status post tracheostomy, COPD, essential hypertension, PUD and sleep apnea who presented to the emergency room with acute onset of worsening dyspnea.  Apparently patient has been in the ER for the past 10 days as he was unable to be cared at home specially requirement of tracheostomy care, awaiting placement. He developed worsening pulmonary edema. Being admitted for management of acute on chronic diastolic heart failure and given IV Lasix.  Patient condition has gradually improved.  He was able to tolerate nocturnal trilogy ventilation.  Currently pending nursing placement.  Assessment and Plan: Acute on chronic diastolic (congestive) heart failure   Acute on chronic respiratory failure with hypoxemia and hypercapnia   Advanced COPD Tracheostomy tube dependence Condition had improved, currently on oral torsemide.  Trach exchange performed 11/14. Patient doing well, condition more stabilized.  Currently pending nursing placement.  However, patient appears to be progressing well with physical therapy.   AKI (acute kidney injury) Caused by diuretics, renal function back to baseline.   GERD without esophagitis On PPI.   Obesity, Class III, BMI 40-49.9 (morbid obesity) ([name]) Estimated body mass index is 48.12 kg/m as calculated from the following:   Height as of this encounter: '6\' 3"'$  (1.905 m).   Weight as of this encounter: 174.6 kg.    Anxiety and depression On Lexapro.   Dyslipidemia --continue statin therapy.   Microcytic Iron def anemia --cont iron supplement --s/p IV iron x3 doses   Right knee pain 2/2 Lateral and medial meniscus tears --per MRI right knee.   --ortho consulted, most  likely require a total knee replacement to address both the meniscal pathology and the degenerative changes, however, pt currently is not a surgical candidate. --s/p Kenalog injection on 11/10 with improved symptoms.   Hypokalemia Improved      Subjective:  Patient doing well, was able to walk a few steps with physical therapy today.  No shortness of breath.  Physical Exam: Vitals:   03/02/22 2358 03/03/22 0205 03/03/22 0409 03/03/22 0500  BP: 122/80  115/76   Pulse: 76  68   Resp:      Temp:      TempSrc:      SpO2: 99% 99% 97%   Weight:    (!) 186.4 kg  Height:       General exam: Appears calm and comfortable, morbid obesity. Respiratory system: Decreased breath sounds. Respiratory effort normal. Cardiovascular system: S1 & S2 heard, RRR. No JVD, murmurs, rubs, gallops or clicks. No pedal edema. Gastrointestinal system: Abdomen is nondistended, soft and nontender. No organomegaly or masses felt. Normal bowel sounds heard. Central nervous system: Alert and oriented. No focal neurological deficits. Extremities: Symmetric 5 x 5 power. Skin: No rashes, lesions or ulcers Psychiatry: Judgement and insight appear normal. Mood & affect appropriate.   Data Reviewed:  There are no new results to review at this time.  Family Communication: None  Disposition: Status is: Inpatient Remains inpatient appropriate because: Unsafe discharge.  Planned Discharge Destination: Skilled nursing facility    Time spent: 27 minutes  Author: Sharen Hones, MD 03/03/2022 12:05 PM  For on call review [email or web address].

## 2022-03-04 DIAGNOSIS — J449 Chronic obstructive pulmonary disease, unspecified: Secondary | ICD-10-CM | POA: Diagnosis not present

## 2022-03-04 DIAGNOSIS — J9621 Acute and chronic respiratory failure with hypoxia: Secondary | ICD-10-CM | POA: Diagnosis not present

## 2022-03-04 DIAGNOSIS — I5033 Acute on chronic diastolic (congestive) heart failure: Secondary | ICD-10-CM | POA: Diagnosis not present

## 2022-03-04 NOTE — Progress Notes (Signed)
Trach care performed. Placed patient on home vent per home settings with 2liters o2.  Vitals stable. Patient in no distress. Tolerated interventions well.

## 2022-03-04 NOTE — Progress Notes (Signed)
  Progress Note   Patient: Gary [name]:[id number] DOB: 1955/11/13 DOA: 01/23/2022     29 DOS: the patient was seen and examined on 03/04/2022   Brief hospital course:  Gary [name] is a 66 y.o. male with medical history significant for diastolic CHF, chronic respiratory failure status post tracheostomy, COPD, essential hypertension, PUD and sleep apnea who presented to the emergency room with acute onset of worsening dyspnea.  Apparently patient has been in the ER for the past 10 days as he was unable to be cared at home specially requirement of tracheostomy care, awaiting placement. He developed worsening pulmonary edema. Being admitted for management of acute on chronic diastolic heart failure and given IV Lasix.  Patient condition has gradually improved.  He was able to tolerate nocturnal trilogy ventilation.  Currently pending nursing placement.  Assessment and Plan:  Acute on chronic diastolic (congestive) heart failure   Acute on chronic respiratory failure with hypoxemia and hypercapnia   Advanced COPD Tracheostomy tube dependence Condition had improved, currently on oral torsemide.  Trach exchange performed 11/14. Patient doing well, condition more stabilized.  Currently pending nursing placement.  However, patient appears to be progressing well with physical therapy.   AKI (acute kidney injury) Caused by diuretics, renal function back to baseline.   GERD without esophagitis On PPI.   Obesity, Class III, BMI 40-49.9 (morbid obesity) ([place]) Estimated body mass index is 48.12 kg/m as calculated from the following:   Height as of this encounter: '6\' 3"'$  (1.905 m).   Weight as of this encounter: 174.6 kg.    Anxiety and depression On Lexapro.   Dyslipidemia --continue statin therapy.   Microcytic Iron def anemia --cont iron supplement --s/p IV iron x3 doses   Right knee pain 2/2 Lateral and medial meniscus tears --per MRI right knee.   --ortho consulted, most  likely require a total knee replacement to address both the meniscal pathology and the degenerative changes, however, pt currently is not a surgical candidate. --s/p Kenalog injection on 11/10 with improved symptoms.   Hypokalemia Improved  Patient condition stable, currently pending nursing placement.  No change in treatment plan today.     Subjective:  Patient doing well, no complaints.  Physical Exam: Vitals:   03/04/22 0400 03/04/22 0500 03/04/22 0600 03/04/22 0800  BP:    125/81  Pulse:    71  Resp: (!) '24 20 20 20  '$ Temp:    98 F (36.7 C)  TempSrc:    Oral  SpO2:    95%  Weight:      Height:       General exam: Appears calm and comfortable, morbid obese. Respiratory system: Clear to auscultation. Respiratory effort normal. Cardiovascular system: S1 & S2 heard, RRR. No JVD, murmurs, rubs, gallops or clicks. No pedal edema. Gastrointestinal system: Abdomen is nondistended, soft and nontender. No organomegaly or masses felt. Normal bowel sounds heard. Central nervous system: Alert and oriented. No focal neurological deficits. Extremities: Symmetric 5 x 5 power. Skin: No rashes, lesions or ulcers Psychiatry: Judgement and insight appear normal. Mood & affect appropriate.   Data Reviewed:  There are no new results to review at this time.  Family Communication: None  Disposition: Status is: Inpatient Remains inpatient appropriate because: Unsafe discharge.  Planned Discharge Destination: Skilled nursing facility    Time spent: 25 minutes  Author: Sharen Hones, MD 03/04/2022 11:38 AM  For on call review [email or web address].

## 2022-03-05 DIAGNOSIS — J449 Chronic obstructive pulmonary disease, unspecified: Secondary | ICD-10-CM | POA: Diagnosis not present

## 2022-03-05 DIAGNOSIS — J9621 Acute and chronic respiratory failure with hypoxia: Secondary | ICD-10-CM | POA: Diagnosis not present

## 2022-03-05 DIAGNOSIS — I5033 Acute on chronic diastolic (congestive) heart failure: Secondary | ICD-10-CM | POA: Diagnosis not present

## 2022-03-05 NOTE — Progress Notes (Signed)
Occupational Therapy Treatment Patient Details Name: Gary [name] MRN: [id number] DOB: [birth year] Today's Date: 03/05/2022   History of present illness Pt is a 66 y.o. 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  Prolonged admittances earlier this year for similar. MD assessment includes: Acute on chronic diastolic CHF, AKI, and acute on chronic respiratory failure with hypoxia.   OT comments  Gary [name] was seen for OT/PT co-treatment on this date. Upon arrival to room pt on bed pan, reports unable to use and agreeable to tx. Pt requires MIN A x2 + RW for sit<>stand x3 at elevated bed height. Pt continues to have good motivation, fatigues quickly. Pt making good progress toward goals, will continue to follow POC. Discharge recommendation remains appropriate.     Recommendations for follow up therapy are one component of a multi-disciplinary discharge planning process, led by the attending physician.  Recommendations may be updated based on patient status, additional functional criteria and insurance authorization.    Follow Up Recommendations  Skilled nursing-short term rehab (<3 hours/day)     Assistance Recommended at Discharge Frequent or constant Supervision/Assistance  Patient can return home with the following  Two people to help with walking and/or transfers;Two people to help with bathing/dressing/bathroom   Equipment Recommendations  Hospital bed    Recommendations for Other Services      Precautions / Restrictions Precautions Precautions: Fall Restrictions Weight Bearing Restrictions: No       Mobility Bed Mobility Overal bed mobility: Needs Assistance Bed Mobility: Supine to Sit, Sit to Supine     Supine to sit: Min guard, HOB elevated Sit to supine: Max assist   General bed mobility comments: assistance for BLE support to return to bed     Transfers Overall transfer level: Needs assistance Equipment used: Rolling walker (2 wheels) Transfers: Sit to/from Stand Sit to Stand: Min assist, +2 physical assistance, +2 safety/equipment, From elevated surface                 Balance Overall balance assessment: Needs assistance Sitting-balance support: Feet supported Sitting balance-[name] Scale: Good     Standing balance support: Bilateral upper extremity supported, Reliant on assistive device for balance Standing balance-[name] Scale: Poor                             ADL either performed or assessed with clinical judgement   ADL Overall ADL's : Needs assistance/impaired                                       General ADL Comments: CGA rolling for bed level toileting, MAX A pericare.      Cognition Arousal/Alertness: Awake/alert Behavior During Therapy: WFL for tasks assessed/performed Overall Cognitive Status: Within Functional Limits for tasks assessed                                          Pertinent Vitals/ Pain       Pain Assessment Pain Assessment: No/denies pain   Frequency  Min 2X/week        Progress Toward Goals  OT Goals(current goals can now be found in the care plan  section)  Progress towards OT goals: Progressing toward goals  Acute Rehab OT Goals Patient Stated Goal: to go home OT Goal Formulation: With patient Time For Goal Achievement: 03/07/22 Potential to Achieve Goals: Fair ADL Goals Pt Will Perform Grooming: with modified independence;sitting Pt Will Perform Lower Body Dressing: sitting/lateral leans;with min assist Pt Will Transfer to Toilet: with min assist;bedside commode;stand pivot transfer  Plan Discharge plan remains appropriate;Frequency remains appropriate    Co-evaluation    PT/OT/SLP Co-Evaluation/Treatment: Yes Reason for Co-Treatment: For patient/therapist safety;To address functional/ADL transfers PT goals  addressed during session: Mobility/safety with mobility OT goals addressed during session: ADL's and self-care      AM-PAC OT "6 Clicks" Daily Activity     Outcome Measure   Help from another person eating meals?: None Help from another person taking care of personal grooming?: A Little Help from another person toileting, which includes using toliet, bedpan, or urinal?: A Lot Help from another person bathing (including washing, rinsing, drying)?: A Lot Help from another person to put on and taking off regular upper body clothing?: A Little Help from another person to put on and taking off regular lower body clothing?: A Lot 6 Click Score: 16    End of Session Equipment Utilized During Treatment: Rolling walker (2 wheels)  OT Visit Diagnosis: Other abnormalities of gait and mobility (R26.89);Muscle weakness (generalized) (M62.81)   Activity Tolerance Patient tolerated treatment well   Patient Left in bed;with call bell/phone within reach;with nursing/sitter in room   Nurse Communication Mobility status        Time: 2458-0998 OT Time Calculation (min): 24 min  Charges: OT General Charges $OT Visit: 1 Visit OT Treatments $Self Care/Home Management : 8-22 mins  [name], M.S. OTR/L  03/05/22, 2:55 PM  ascom [phone]

## 2022-03-05 NOTE — Progress Notes (Signed)
Physical Therapy Treatment Patient Details Name: Gary [name] MRN: [id number] DOB: [birth year] Today's Date: 03/05/2022   History of Present Illness Pt is a 66 y.o. 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  Prolonged admittances earlier this year for similar. MD assessment includes: Acute on chronic diastolic CHF, AKI, and acute on chronic respiratory failure with hypoxia.    PT Comments    Patient is agreeable to PT. He requires cues for transfer techniques to increase independence with standing. He continues to require +2 person assistance to stand with bed height elevated. Pre-gait activity performed with cues for weight shifting and increasing standing tolerance in preparation for ambulation. Patient unable to take steps today. 3 bouts of standing performed with standing tolerance of 45 seconds, 30 seconds, 30 seconds.  Recommend to continue PT to maximize independence and facilitate return to prior level of function.    Recommendations for follow up therapy are one component of a multi-disciplinary discharge planning process, led by the attending physician.  Recommendations may be updated based on patient status, additional functional criteria and insurance authorization.  Follow Up Recommendations  Skilled nursing-short term rehab (<3 hours/day) Can patient physically be transported by private vehicle: No   Assistance Recommended at Discharge Frequent or constant Supervision/Assistance  Patient can return home with the following Two people to help with walking and/or transfers;Two people to help with bathing/dressing/bathroom;Assistance with cooking/housework;Assist for transportation;Help with stairs or ramp for entrance   Equipment Recommendations  None recommended by PT    Recommendations for Other Services       Precautions / Restrictions  Precautions Precautions: Fall Restrictions Weight Bearing Restrictions: No     Mobility  Bed Mobility Overal bed mobility: Needs Assistance Bed Mobility: Supine to Sit, Sit to Supine     Supine to sit: Min guard, HOB elevated Sit to supine: Max assist   General bed mobility comments: assistance for BLE support to return to bed    Transfers Overall transfer level: Needs assistance Equipment used: Rolling walker (2 wheels) Transfers: Sit to/from Stand Sit to Stand: Min assist, +2 physical assistance, +2 safety/equipment, From elevated surface           General transfer comment: 3 standing bouts performed with verbal cues for anterior weight shifting, LE foot positioning to facilitate independence with standing. bed height still elevated per patient preference    Ambulation/Gait             Pre-gait activities: cues for weight shifting for movement of RLE. patient unable to take a step due to limited standing tolerance and generalized weakness     Stairs             Wheelchair Mobility    Modified Rankin (Stroke Patients Only)       Balance Overall balance assessment: Needs assistance Sitting-balance support: Feet supported Sitting balance-[name] Scale: Good     Standing balance support: Bilateral upper extremity supported, Reliant on assistive device for balance Standing balance-[name] Scale: Poor Standing balance comment: faciliation for anterior weight shifitng initially. standing tolerance of 45 seconds, 30 seconds, 30 seconds                            Cognition Arousal/Alertness: Awake/alert Behavior During Therapy: WFL for tasks assessed/performed Overall Cognitive Status: Within Functional Limits for tasks assessed  Exercises      General Comments        Pertinent Vitals/Pain Pain Assessment Pain Assessment: No/denies pain    Home Living                           Prior Function            PT Goals (current goals can now be found in the care plan section) Acute Rehab PT Goals PT Goal Formulation: With patient Time For Goal Achievement: 03/11/22 Potential to Achieve Goals: Fair Progress towards PT goals: Progressing toward goals    Frequency    Min 2X/week      PT Plan Current plan remains appropriate    Co-evaluation   Reason for Co-Treatment: For patient/therapist safety;To address functional/ADL transfers PT goals addressed during session: Mobility/safety with mobility OT goals addressed during session: ADL's and self-care      AM-PAC PT "6 Clicks" Mobility   Outcome Measure  Help needed turning from your back to your side while in a flat bed without using bedrails?: A Lot Help needed moving from lying on your back to sitting on the side of a flat bed without using bedrails?: A Lot Help needed moving to and from a bed to a chair (including a wheelchair)?: Total Help needed standing up from a chair using your arms (e.g., wheelchair or bedside chair)?: Total Help needed to walk in hospital room?: Total Help needed climbing 3-5 steps with a railing? : Total 6 Click Score: 8    End of Session         PT Visit Diagnosis: Unsteadiness on feet (R26.81);Muscle weakness (generalized) (M62.81);Difficulty in walking, not elsewhere classified (R26.2)     Time: 7121-9758 PT Time Calculation (min) (ACUTE ONLY): 24 min  Charges:  $Therapeutic Activity: 8-22 mins                     [name], PT, MPT   [name] 03/05/2022, 3:36 PM

## 2022-03-05 NOTE — Progress Notes (Signed)
  Progress Note   Patient: Gary [name] EZM:[id number] DOB: 1955-04-29 DOA: 01/23/2022     30 DOS: the patient was seen and examined on 03/05/2022   Brief hospital course:  TANAV ORSAK is a 66 y.o. male with medical history significant for diastolic CHF, chronic respiratory failure status post tracheostomy, COPD, essential hypertension, PUD and sleep apnea who presented to the emergency room with acute onset of worsening dyspnea.  Apparently patient has been in the ER for the past 10 days as he was unable to be cared at home specially requirement of tracheostomy care, awaiting placement. He developed worsening pulmonary edema. Being admitted for management of acute on chronic diastolic heart failure and given IV Lasix.  Patient condition has gradually improved.  He was able to tolerate nocturnal trilogy ventilation.  Currently pending nursing placement.  Assessment and Plan: Acute on chronic diastolic (congestive) heart failure   Acute on chronic respiratory failure with hypoxemia and hypercapnia   Advanced COPD Tracheostomy tube dependence Condition had improved, currently on oral torsemide.  Trach exchange performed 11/14. Patient doing well, condition more stabilized.  Currently pending nursing placement.  However, patient appears to be progressing well with physical therapy.   AKI (acute kidney injury) Caused by diuretics, renal function back to baseline.   GERD without esophagitis On PPI.   Obesity, Class III, BMI 40-49.9 (morbid obesity) ([place]) Estimated body mass index is 48.12 kg/m as calculated from the following:   Height as of this encounter: '6\' 3"'$  (1.905 m).   Weight as of this encounter: 174.6 kg.    Anxiety and depression On Lexapro.   Dyslipidemia --continue statin therapy.   Microcytic Iron def anemia --cont iron supplement --s/p IV iron x3 doses   Right knee pain 2/2 Lateral and medial meniscus tears --per MRI right knee.   --ortho consulted, most  likely require a total knee replacement to address both the meniscal pathology and the degenerative changes, however, pt currently is not a surgical candidate. --s/p Kenalog injection on 11/10 with improved symptoms.   Hypokalemia Improved  Patient doing well, no new issues, currently pending nursing home placement.     Subjective: Patient has no complaints.  Physical Exam: Vitals:   03/05/22 0908 03/05/22 0910 03/05/22 1134 03/05/22 1136  BP:  117/83  120/80  Pulse: 80  78   Resp: (!) 21 (!) 24  (!) 35  Temp:    98.1 F (36.7 C)  TempSrc:    Oral  SpO2: 96%   95%  Weight:      Height:       General exam: Appears calm and comfortable  Respiratory system: Clear to auscultation. Respiratory effort normal. Cardiovascular system: S1 & S2 heard, RRR. No JVD, murmurs, rubs, gallops or clicks. No pedal edema. Gastrointestinal system: Abdomen is nondistended, soft and nontender. No organomegaly or masses felt. Normal bowel sounds heard. Central nervous system: Alert and oriented. No focal neurological deficits. Extremities: Symmetric 5 x 5 power. Skin: No rashes, lesions or ulcers Psychiatry: Judgement and insight appear normal. Mood & affect appropriate.   Data Reviewed:  There are no new results to review at this time.  Family Communication: none  Disposition: Status is: Inpatient Remains inpatient appropriate because: Unsafe discharge    Time spent: 25 minutes  Author: Sharen Hones, MD 03/05/2022 2:19 PM  For on call review [email or web address].

## 2022-03-06 DIAGNOSIS — J9621 Acute and chronic respiratory failure with hypoxia: Secondary | ICD-10-CM | POA: Diagnosis not present

## 2022-03-06 DIAGNOSIS — E876 Hypokalemia: Secondary | ICD-10-CM | POA: Diagnosis present

## 2022-03-06 DIAGNOSIS — J449 Chronic obstructive pulmonary disease, unspecified: Secondary | ICD-10-CM | POA: Diagnosis not present

## 2022-03-06 DIAGNOSIS — I5033 Acute on chronic diastolic (congestive) heart failure: Secondary | ICD-10-CM | POA: Diagnosis not present

## 2022-03-06 NOTE — Progress Notes (Signed)
  Progress Note   Patient: Gary [name] QQI:[id number] DOB: [birth year] DOA: 01/23/2022     31 DOS: the patient was seen and examined on 03/06/2022   Brief hospital course:  DUSTEN ELLINWOOD is a 66 y.o. male with medical history significant for diastolic CHF, chronic respiratory failure status post tracheostomy, COPD, essential hypertension, PUD and sleep apnea who presented to the emergency room with acute onset of worsening dyspnea.  Apparently patient has been in the ER for the past 10 days as he was unable to be cared at home specially requirement of tracheostomy care, awaiting placement. He developed worsening pulmonary edema. Being admitted for management of acute on chronic diastolic heart failure and given IV Lasix.  Patient condition has gradually improved.  He was able to tolerate nocturnal trilogy ventilation.  Currently pending nursing placement.  Assessment and Plan: Acute on chronic diastolic (congestive) heart failure   Acute on chronic respiratory failure with hypoxemia and hypercapnia   Advanced COPD Tracheostomy tube dependence Condition had improved, currently on oral torsemide.  Trach exchange performed 11/14. Patient doing well, condition more stabilized.  Currently pending nursing placement.  However, patient appears to be progressing well with physical therapy.   AKI (acute kidney injury) Caused by diuretics, renal function back to baseline.   GERD without esophagitis On PPI.   Obesity, Class III, BMI 40-49.9 (morbid obesity) ([place]) Estimated body mass index is 48.12 kg/m as calculated from the following:   Height as of this encounter: '6\' 3"'$  (1.905 m).   Weight as of this encounter: 174.6 kg.    Anxiety and depression On Lexapro.   Dyslipidemia --continue statin therapy.   Microcytic Iron def anemia --cont iron supplement --s/p IV iron x3 doses   Right knee pain 2/2 Lateral and medial meniscus tears --per MRI right knee.   --ortho consulted, most  likely require a total knee replacement to address both the meniscal pathology and the degenerative changes, however, pt currently is not a surgical candidate. --s/p Kenalog injection on 11/10 with improved symptoms.   Hypokalemia Improved.  No new issues, patient feels tired due to weather.  Still pending nursing home placement.      Subjective:  Feels tired, was able to stand with physical therapy, not able to walk.  Physical Exam: Vitals:   03/06/22 0745 03/06/22 0900 03/06/22 0923 03/06/22 1223  BP:   114/69   Pulse: 75   78  Resp: '15  18 19  '$ Temp:  98.2 F (36.8 C)    TempSrc:  Oral    SpO2: 96%     Weight:      Height:       General exam: Appears calm and comfortable, morbid obesity. Respiratory system: Clear to auscultation. Respiratory effort normal. Cardiovascular system: S1 & S2 heard, RRR. No JVD, murmurs, rubs, gallops or clicks. No pedal edema. Gastrointestinal system: Abdomen is nondistended, soft and nontender. No organomegaly or masses felt. Normal bowel sounds heard. Central nervous system: Alert and oriented. No focal neurological deficits. Extremities: Symmetric 5 x 5 power. Skin: No rashes, lesions or ulcers Psychiatry: Judgement and insight appear normal. Mood & affect appropriate.   Data Reviewed:  There are no new results to review at this time.  Family Communication: None  Disposition: Status is: Inpatient Remains inpatient appropriate because: Unsafe discharge.  Planned Discharge Destination: Skilled nursing facility    Time spent: 25 minutes  Author: Sharen Hones, MD 03/06/2022 2:44 PM  For on call review [email or web address].

## 2022-03-06 NOTE — Progress Notes (Signed)
Physical Therapy Treatment Patient Details Name: Gary [name] MRN: [id number] DOB: [birth year] Today's Date: 03/06/2022   History of Present Illness Pt is a 66 y.o. 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  Prolonged admittances earlier this year for similar. MD assessment includes: Acute on chronic diastolic CHF, AKI, and acute on chronic respiratory failure with hypoxia.    PT Comments    Pt was pleasant and motivated to participate during the session and put forth good effort throughout. Pt required no physical assistance during sup to sit but did need extra time, effort, and use of bed rails.  Pt required +2 max A with sit to sup, however, to manage his trunk and BLEs.  Pt able to come to standing from an elevated EOB twice this session with heavy cuing for sequencing and with +2 assist.  Pt's max standing tolerance was 40 sec this session and despite encouragement and pt effort pt was unable to come to standing a third time.  SpO2 and HR were both WNL on trach.  Pt will benefit from PT services in a SNF setting upon discharge to safely address deficits listed in patient problem list for decreased caregiver assistance and eventual return to PLOF.     Recommendations for follow up therapy are one component of a multi-disciplinary discharge planning process, led by the attending physician.  Recommendations may be updated based on patient status, additional functional criteria and insurance authorization.  Follow Up Recommendations  Skilled nursing-short term rehab (<3 hours/day) Can patient physically be transported by private vehicle: No   Assistance Recommended at Discharge Frequent or constant Supervision/Assistance  Patient can return home with the following Two people to help with walking and/or transfers;Two people to help with bathing/dressing/bathroom;Assistance with  cooking/housework;Assist for transportation;Help with stairs or ramp for entrance   Equipment Recommendations  None recommended by PT    Recommendations for Other Services       Precautions / Restrictions Precautions Precautions: Fall Precaution Comments: cuffed trach/ trach collar Restrictions Weight Bearing Restrictions: No     Mobility  Bed Mobility Overal bed mobility: Needs Assistance Bed Mobility: Supine to Sit, Sit to Supine     Supine to sit: Supervision Sit to supine: Max assist, +2 for physical assistance   General bed mobility comments: +2 max A for BLE and trunk control during sit to sup    Transfers Overall transfer level: Needs assistance Equipment used: Rolling walker (2 wheels) Transfers: Sit to/from Stand Sit to Stand: +2 physical assistance, From elevated surface, Mod assist           General transfer comment: Max verbal and tactile cues for sequencing with +2 mod A to come to standing; pt able to stand twice with max standing time 40 sec; multiple attempts to stand a third time with pt unable to clear the surface of the bed    Ambulation/Gait               General Gait Details: currently unable   Stairs             Wheelchair Mobility    Modified Rankin (Stroke Patients Only)       Balance Overall balance assessment: Needs assistance Sitting-balance support: Feet supported Sitting balance-[name] Scale: Good Sitting balance - Comments: supervision for safety   Standing balance support: Bilateral upper extremity supported, Reliant on assistive device for balance  Standing balance-[name] Scale: Poor                              Cognition Arousal/Alertness: Awake/alert Behavior During Therapy: WFL for tasks assessed/performed Overall Cognitive Status: Within Functional Limits for tasks assessed                                          Exercises Other Exercises Other Exercises: Posterior  scooting at EOB 2 x 3-4 small scoots with mod A    General Comments        Pertinent Vitals/Pain Pain Assessment Pain Assessment: 0-10 Pain Score: 8  Pain Location: L hand Pain Descriptors / Indicators: Aching, Sore Pain Intervention(s): Premedicated before session, Monitored during session, Repositioned    Home Living                          Prior Function            PT Goals (current goals can now be found in the care plan section) Progress towards PT goals: PT to reassess next treatment    Frequency    Min 2X/week      PT Plan Current plan remains appropriate    Co-evaluation PT/OT/SLP Co-Evaluation/Treatment: Yes Reason for Co-Treatment: Complexity of the patient's impairments (multi-system involvement);For patient/therapist safety PT goals addressed during session: Mobility/safety with mobility        AM-PAC PT "6 Clicks" Mobility   Outcome Measure  Help needed turning from your back to your side while in a flat bed without using bedrails?: A Lot Help needed moving from lying on your back to sitting on the side of a flat bed without using bedrails?: A Lot Help needed moving to and from a bed to a chair (including a wheelchair)?: Total Help needed standing up from a chair using your arms (e.g., wheelchair or bedside chair)?: A Lot Help needed to walk in hospital room?: Total Help needed climbing 3-5 steps with a railing? : Total 6 Click Score: 9    End of Session Equipment Utilized During Treatment: Other (comment) (pt refuses gait belt. trach collar donned) Activity Tolerance: Patient tolerated treatment well Patient left: in bed;with call bell/phone within reach Nurse Communication: Mobility status PT Visit Diagnosis: Unsteadiness on feet (R26.81);Muscle weakness (generalized) (M62.81);Difficulty in walking, not elsewhere classified (R26.2)     Time: 2330-0762 PT Time Calculation (min) (ACUTE ONLY): 26 min  Charges:  $Therapeutic  Activity: 8-22 mins                     D. Scott Tinita Brooker PT, DPT 03/06/22, 2:55 PM

## 2022-03-06 NOTE — Progress Notes (Signed)
Occupational Therapy Treatment Patient Details Name: Gary [name] MRN: [id number] DOB: 12-Nov-1955 Today's Date: 03/06/2022   History of present illness Pt is a 66 y.o. 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  Prolonged admittances earlier this year for similar. MD assessment includes: Acute on chronic diastolic CHF, AKI, and acute on chronic respiratory failure with hypoxia.   OT comments  Gary [name] was seen for OT/PT co-treatment on this date, goals reassessed 2/2 prolonged length of stay and remain appropriate. Upon arrival to room pt seated EOB with PT, agreeable to tx. Pt requires MIN - MOD A x2 sit<>stand at elevated bed height for 2 trials, max 40 sec standing. Pt continues to be limited by fear of falling. Discharge recommendation remains appropriate.     Recommendations for follow up therapy are one component of a multi-disciplinary discharge planning process, led by the attending physician.  Recommendations may be updated based on patient status, additional functional criteria and insurance authorization.    Follow Up Recommendations  Skilled nursing-short term rehab (<3 hours/day)     Assistance Recommended at Discharge Frequent or constant Supervision/Assistance  Patient can return home with the following  Two people to help with walking and/or transfers;Two people to help with bathing/dressing/bathroom   Equipment Recommendations  Hospital bed    Recommendations for Other Services      Precautions / Restrictions Precautions Precautions: Fall Precaution Comments: cuffed trach/ trach collar Restrictions Weight Bearing Restrictions: No       Mobility Bed Mobility Overal bed mobility: Needs Assistance Bed Mobility: Supine to Sit, Sit to Supine     Supine to sit: Supervision Sit to supine: Max assist, +2 for physical assistance         Transfers Overall transfer level: Needs assistance Equipment used: Rolling walker (2 wheels) Transfers: Sit to/from Stand Sit to Stand: +2 physical assistance, From elevated surface, Mod assist           General transfer comment: Attempted standing from slightly lower elevated surface, required MOD A x2. MIN A x2 from max elevated surface     Balance Overall balance assessment: Needs assistance Sitting-balance support: Feet supported Sitting balance-[name] Scale: Good     Standing balance support: Bilateral upper extremity supported, Reliant on assistive device for balance Standing balance-[name] Scale: Poor                             ADL either performed or assessed with clinical judgement   ADL Overall ADL's : Needs assistance/impaired                                       General ADL Comments: MAX A don B shoes      Cognition Arousal/Alertness: Awake/alert Behavior During Therapy: WFL for tasks assessed/performed Overall Cognitive Status: Within Functional Limits for tasks assessed                                           Pertinent Vitals/ Pain       Pain Assessment Pain Assessment: Faces Faces Pain Scale: Hurts a little bit Pain Location: R knee Pain Descriptors / Indicators: Aching, Sore  Pain Intervention(s): Limited activity within patient's tolerance   Frequency  Min 2X/week        Progress Toward Goals  OT Goals(current goals can now be found in the care plan section)  Progress towards OT goals: Progressing toward goals  Acute Rehab OT Goals Patient Stated Goal: to go home OT Goal Formulation: With patient Time For Goal Achievement: 03/20/22 Potential to Achieve Goals: Fair ADL Goals Pt Will Perform Grooming: with modified independence;sitting Pt Will Perform Lower Body Dressing: sitting/lateral leans;with min assist Pt Will Transfer to Toilet: with min assist;bedside commode;stand pivot  transfer  Plan Discharge plan remains appropriate;Frequency remains appropriate    Co-evaluation    PT/OT/SLP Co-Evaluation/Treatment: Yes Reason for Co-Treatment: Complexity of the patient's impairments (multi-system involvement);For patient/therapist safety PT goals addressed during session: Mobility/safety with mobility OT goals addressed during session: ADL's and self-care      AM-PAC OT "6 Clicks" Daily Activity     Outcome Measure   Help from another person eating meals?: None Help from another person taking care of personal grooming?: A Little Help from another person toileting, which includes using toliet, bedpan, or urinal?: A Lot Help from another person bathing (including washing, rinsing, drying)?: A Lot Help from another person to put on and taking off regular upper body clothing?: A Little Help from another person to put on and taking off regular lower body clothing?: A Lot 6 Click Score: 16    End of Session Equipment Utilized During Treatment: Rolling walker (2 wheels)  OT Visit Diagnosis: Other abnormalities of gait and mobility (R26.89);Muscle weakness (generalized) (M62.81)   Activity Tolerance Patient tolerated treatment well   Patient Left in bed;with call bell/phone within reach;with nursing/sitter in room   Nurse Communication Mobility status        Time: 3151-7616 OT Time Calculation (min): 21 min  Charges: OT General Charges $OT Visit: 1 Visit OT Evaluation $OT Re-eval: 1 Re-eval  [name], M.S. OTR/L  03/06/22, 3:39 PM  ascom [phone]

## 2022-03-07 DIAGNOSIS — J9621 Acute and chronic respiratory failure with hypoxia: Secondary | ICD-10-CM | POA: Diagnosis not present

## 2022-03-07 DIAGNOSIS — J449 Chronic obstructive pulmonary disease, unspecified: Secondary | ICD-10-CM | POA: Diagnosis not present

## 2022-03-07 DIAGNOSIS — I5033 Acute on chronic diastolic (congestive) heart failure: Secondary | ICD-10-CM | POA: Diagnosis not present

## 2022-03-07 NOTE — Progress Notes (Signed)
Progress Note   Patient: Gary [name] DOB: [birth year] DOA: 01/23/2022     32 DOS: the patient was seen and examined on 03/07/2022   Brief hospital course:  Gary [name] is a 66 y.o. male with medical history significant for diastolic CHF, chronic respiratory failure status post tracheostomy, COPD, essential hypertension, PUD and sleep apnea who presented to the emergency room with acute onset of worsening dyspnea.  Apparently patient has been in the ER for the past 10 days as he was unable to be cared at home specially requirement of tracheostomy care, awaiting placement. He developed worsening pulmonary edema. Being admitted for management of acute on chronic diastolic heart failure and given IV Lasix.  Patient condition has gradually improved.  He was able to tolerate nocturnal trilogy ventilation.  Currently pending nursing placement.  Assessment and Plan: Acute on chronic diastolic (congestive) heart failure   Acute on chronic respiratory failure with hypoxemia and hypercapnia   Advanced COPD Tracheostomy tube dependence Condition had improved, currently on oral torsemide.  Trach exchange performed 11/14. Patient doing well, condition more stabilized.  Currently pending nursing placement.  However, patient appears to be progressing well with physical therapy.   AKI (acute kidney injury) Caused by diuretics, renal function back to baseline.   GERD without esophagitis On PPI.   Obesity, Class III, BMI 40-49.9 (morbid obesity) ([place]) Estimated body mass index is 48.12 kg/m as calculated from the following:   Height as of this encounter: '6\' 3"'$  (1.905 m).   Weight as of this encounter: 174.6 kg.    Anxiety and depression On Lexapro.   Dyslipidemia --continue statin therapy.   Microcytic Iron def anemia --cont iron supplement --s/p IV iron x3 doses   Right knee pain 2/2 Lateral and medial meniscus tears --per MRI right knee.   --ortho consulted, most  likely require a total knee replacement to address both the meniscal pathology and the degenerative changes, however, pt currently is not a surgical candidate. --s/p Kenalog injection on 11/10 with improved symptoms.   Hypokalemia Improved.  There is no issues over the past week, he took a ICU bed, but not in ICU status due to ventilation through tracheostomy. Waiting for nursing home placement.     Subjective:  Patient doing well, able to stand with physical therapy, tolerating ventilation at nighttime.  Physical Exam: Vitals:   03/07/22 0433 03/07/22 0745 03/07/22 0841 03/07/22 1210  BP: (!) 114/93 124/71  125/81  Pulse: 76  81 79  Resp: (!) 21 (!) 28 20 (!) 23  Temp:  98 F (36.7 C)  97.7 F (36.5 C)  TempSrc:  Oral  Oral  SpO2: 100% 96%  94%  Weight:      Height:       General exam: Appears calm and comfortable, morbidly obese. Respiratory system: Clear to auscultation. Respiratory effort normal. Cardiovascular system: S1 & S2 heard, RRR. No JVD, murmurs, rubs, gallops or clicks. No pedal edema. Gastrointestinal system: Abdomen is nondistended, soft and nontender. No organomegaly or masses felt. Normal bowel sounds heard. Central nervous system: Alert and oriented. No focal neurological deficits. Extremities: Symmetric 5 x 5 power. Skin: No rashes, lesions or ulcers Psychiatry: Judgement and insight appear normal. Mood & affect appropriate.   Data Reviewed:  There are no new results to review at this time.  Family Communication: None  Disposition: Status is: Inpatient Remains inpatient appropriate because: Unsafe for discharge.  Planned Discharge Destination: Skilled nursing facility    Time spent:  25 minutes  Author: Sharen Hones, MD 03/07/2022 12:57 PM  For on call review [email or web address].

## 2022-03-07 NOTE — Plan of Care (Signed)
  Problem: Activity: Goal: Capacity to carry out activities will improve Outcome: Not Progressing Note: Encourage to provide more self care.

## 2022-03-07 NOTE — Progress Notes (Signed)
Physical Therapy Treatment Patient Details Name: Gary [name] MRN: [id number] DOB: 1955-10-14 Today's Date: 03/07/2022   History of Present Illness Pt is a 66 y.o. 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  Prolonged admittances earlier this year for similar. MD assessment includes: Acute on chronic diastolic CHF, AKI, and acute on chronic respiratory failure with hypoxia.    PT Comments    Pt declined all but supine therex this session stating "I'll get up next time, I just want to do bed exercises". Pt education provided on physiological benefits of mobility but continued to decline.  Pt did put very good effort into below therex, however, with manual resistance provided to patient tolerance when able with therapeutic rest breaks provided as needed.  Pt reported no adverse symptoms during the session.  Pt will benefit from PT services in a SNF setting upon discharge to safely address deficits listed in patient problem list for decreased caregiver assistance and eventual return to PLOF.    Recommendations for follow up therapy are one component of a multi-disciplinary discharge planning process, led by the attending physician.  Recommendations may be updated based on patient status, additional functional criteria and insurance authorization.  Follow Up Recommendations  Skilled nursing-short term rehab (<3 hours/day) Can patient physically be transported by private vehicle: No   Assistance Recommended at Discharge Frequent or constant Supervision/Assistance  Patient can return home with the following Two people to help with walking and/or transfers;Two people to help with bathing/dressing/bathroom;Assistance with cooking/housework;Assist for transportation;Help with stairs or ramp for entrance   Equipment Recommendations  None recommended by PT    Recommendations for Other  Services       Precautions / Restrictions Precautions Precautions: Fall Precaution Comments: cuffed trach/ trach collar Restrictions Weight Bearing Restrictions: No     Mobility  Bed Mobility               General bed mobility comments: NT, pt declined all but supine therex this session    Transfers                        Ambulation/Gait                   Stairs             Wheelchair Mobility    Modified Rankin (Stroke Patients Only)       Balance                                            Cognition Arousal/Alertness: Awake/alert Behavior During Therapy: WFL for tasks assessed/performed Overall Cognitive Status: Within Functional Limits for tasks assessed                                          Exercises Total Joint Exercises Ankle Circles/Pumps: Strengthening, Both, 10 reps, 15 reps (with manual resistance) Quad Sets: Strengthening, Both, 10 reps, 15 reps Gluteal Sets: Strengthening, Both, 10 reps, 15 reps Short Arc Quad: Strengthening, Both, 10 reps, 15 reps (with manual resistance) Heel Slides: Strengthening, Both, 10 reps, 15 reps (with manual resistance during knee extension phase) Hip ABduction/ADduction: Strengthening,  Both, 10 reps, 15 reps (with manual resistance) Straight Leg Raises: AAROM, 10 reps, Both, Strengthening, 5 reps Bridges: Strengthening, Both, 5 reps, 10 reps (with bolsters under the knees, very small amplitude, unable to clear surface of bed) Other Exercises Other Exercises: Pt education provided on physiological benefits of activity, sitting, EOB, and standing Other Exercises: HEP education/review for BLE APs, QS, and GS x 10 each every hour with training on proper breathing technique    General Comments        Pertinent Vitals/Pain Pain Assessment Pain Assessment: 0-10 Pain Score: 8  Pain Location: Bil hands Pain Descriptors / Indicators: Aching, Sore Pain  Intervention(s): Premedicated before session, Monitored during session    Home Living                          Prior Function            PT Goals (current goals can now be found in the care plan section) Progress towards PT goals: PT to reassess next treatment    Frequency    Min 2X/week      PT Plan Current plan remains appropriate    Co-evaluation              AM-PAC PT "6 Clicks" Mobility   Outcome Measure  Help needed turning from your back to your side while in a flat bed without using bedrails?: A Lot Help needed moving from lying on your back to sitting on the side of a flat bed without using bedrails?: A Lot Help needed moving to and from a bed to a chair (including a wheelchair)?: A Lot Help needed standing up from a chair using your arms (e.g., wheelchair or bedside chair)?: A Lot Help needed to walk in hospital room?: Total Help needed climbing 3-5 steps with a railing? : Total 6 Click Score: 10    End of Session Equipment Utilized During Treatment: Oxygen Activity Tolerance: Patient tolerated treatment well Patient left: in bed;with call bell/phone within reach Nurse Communication: Mobility status PT Visit Diagnosis: Unsteadiness on feet (R26.81);Muscle weakness (generalized) (M62.81);Difficulty in walking, not elsewhere classified (R26.2)     Time: 3559-7416 PT Time Calculation (min) (ACUTE ONLY): 24 min  Charges:  $Therapeutic Exercise: 23-37 mins                     D. Royetta Asal PT, DPT 03/07/22, 3:44 PM

## 2022-03-08 DIAGNOSIS — I5033 Acute on chronic diastolic (congestive) heart failure: Secondary | ICD-10-CM | POA: Diagnosis not present

## 2022-03-08 NOTE — Progress Notes (Signed)
PROGRESS NOTE    Gary [name]  BTD:[id number] DOB: 1955-06-16 DOA: 01/23/2022 [facility]: [facility]   Brief Narrative:  This 66 years old male with medical history significant for diastolic CHF, chronic respiratory failure status post tracheostomy, COPD, essential hypertension, PUD and sleep apnea who presented to the emergency room with acute onset of worsening dyspnea.  Apparently patient has been in the ER for the past 10 days as he was unable to be cared at home specially requirement of tracheostomy care, awaiting placement. He developed worsening pulmonary edema later being admitted for management of acute on chronic diastolic heart failure and given IV Lasix.  Patient condition has gradually improved.  He was able to tolerate nocturnal trilogy ventilation.  Currently pending nursing placement.  Assessment & Plan:   Principal Problem:   Acute on chronic diastolic (congestive) heart failure ([name]) Active Problems:   Acute on chronic respiratory failure with hypoxia ([name])   AKI (acute kidney injury) ([name])   Chronic obstructive pulmonary disease (COPD) ([name])   Obesity, Class III, BMI 40-49.9 (morbid obesity) ([place])   GERD without esophagitis   Anxiety and depression   Dyslipidemia   Hypokalemia  Acute on chronic diastolic heart failure: Acute on chronic hypoxic and hypercapnic respiratory failure secondary to advanced COPD Tracheostomy tube dependence: Condition had improved.  Currently on oral torsemide.Trach exchange performed on 11/14. Patient doing well, condition more stabilized.  Currently pending nursing home placement.  However, patient appears to be progressing well with physical therapy.   Acute kidney injury: Could be due to diuretic use.  Renal functions are now back to baseline. AKI resolved.   GERD without esophagitis: Continue pantoprazole 40 mg daily.   Obesity, Class III, BMI 40-49.9 (morbid obesity) ([place]) Estimated body mass index is 48.12  kg/m as calculated from the following:   Height as of this encounter: '6\' 3"'$  (1.905 m).   Weight as of this encounter: 174.6 kg.    Anxiety and depression Continue Lexapro.   Dyslipidemia: Continue statin therapy..   Microcytic normochromic anemia Iron deficiency anemia. Continue iron supplementation. s/p IV iron x3 doses   Right knee pain secondary to lateral and medial meniscal tear. MRI knee confirms.--ortho consulted, most likely require a total knee replacement to address both the meniscal pathology and the degenerative changes, however, pt currently is not a surgical candidate. --s/p Kenalog injection on 11/10 with improved symptoms.   Hypokalemia: Replaced and resolved.   Awaiting for nursing home placement.   DVT prophylaxis: Eliquis Code Status: Full code Family Communication: No family at bedside Disposition Plan:   Status is: Inpatient Remains inpatient appropriate because: Admitted for acute on chronic hypoxic and hypercapnic respiratory secondary to acute on chronic diastolic CHF exacerbation requiring diuresis.  Patient is now medically clear awaiting SNF placement.    Consultants:  PCCM  Procedures: None Antimicrobials:  Anti-infectives (From admission, onward)    None       Subjective: Patient was seen and examined at bedside.  Overnight events noted.  Patient reports doing better, denies any complaints.  Patient is awaiting SNF placement.  Objective: Vitals:   03/08/22 0436 03/08/22 0733 03/08/22 0746 03/08/22 1200  BP: 116/79 122/78  118/81  Pulse: 81 70  88  Resp: '19 19  16  '$ Temp: 98 F (36.7 C)  97.9 F (36.6 C) 98 F (36.7 C)  TempSrc: Oral  Oral Oral  SpO2: 96%     Weight:      Height:  Intake/Output Summary (Last 24 hours) at 03/08/2022 1534 Last data filed at 03/08/2022 1200 Gross per 24 hour  Intake 1611 ml  Output 3000 ml  Net -1389 ml   Filed Weights   03/01/22 0442 03/02/22 0352 03/03/22 0500  Weight: (!) 181.3  kg (!) 181.4 kg (!) 186.4 kg    Examination:  General exam: Appears comfortable, not in any acute distress, morbidly obese. Respiratory system: CTA bilaterally, respiratory effort normal, RR 15. Cardiovascular system: S1 & S2 heard, regular rate and rhythm, no murmur. Gastrointestinal system: Abdomen is soft, non tender, non distended, BS+ Central nervous system: Alert and oriented x 3. No focal neurological deficits. Extremities: No edema, no cyanosis, no clubbing Skin: No rashes, lesions or ulcers Psychiatry: Judgement and insight appear normal. Mood & affect appropriate.     Data Reviewed: I have personally reviewed following labs and imaging studies  CBC: Recent Labs  Lab 03/02/22 0459  WBC 9.9  HGB 9.4*  HCT 34.0*  MCV 76.6*  PLT 540   Basic Metabolic Panel: Recent Labs  Lab 03/02/22 0459  NA 141  K 4.7  CL 107  CO2 28  GLUCOSE 120*  BUN 43*  CREATININE 1.24  CALCIUM 8.5*  MG 2.2   GFR: Estimated Creatinine Clearance: 106.7 mL/min (by C-G formula based on SCr of 1.24 mg/dL). Liver Function Tests: No results for input(s): "AST", "ALT", "ALKPHOS", "BILITOT", "PROT", "ALBUMIN" in the last 168 hours. No results for input(s): "LIPASE", "AMYLASE" in the last 168 hours. No results for input(s): "AMMONIA" in the last 168 hours. Coagulation Profile: No results for input(s): "INR", "PROTIME" in the last 168 hours. Cardiac Enzymes: No results for input(s): "CKTOTAL", "CKMB", "CKMBINDEX", "TROPONINI" in the last 168 hours. BNP (last 3 results) No results for input(s): "PROBNP" in the last 8760 hours. HbA1C: No results for input(s): "HGBA1C" in the last 72 hours. CBG: No results for input(s): "GLUCAP" in the last 168 hours. Lipid Profile: No results for input(s): "CHOL", "HDL", "LDLCALC", "TRIG", "CHOLHDL", "LDLDIRECT" in the last 72 hours. Thyroid Function Tests: No results for input(s): "TSH", "T4TOTAL", "FREET4", "T3FREE", "THYROIDAB" in the last 72  hours. Anemia Panel: No results for input(s): "VITAMINB12", "FOLATE", "FERRITIN", "TIBC", "IRON", "RETICCTPCT" in the last 72 hours. Sepsis Labs: No results for input(s): "PROCALCITON", "LATICACIDVEN" in the last 168 hours.  No results found for this or any previous visit (from the past 240 hour(s)).   Radiology Studies: No results found.  Scheduled Meds:  ammonium lactate   Topical BID   apixaban  2.5 mg Oral BID   Chlorhexidine Gluconate Cloth  6 each Topical Q0600   vitamin B-12  500 mcg Oral Daily   diclofenac Sodium  2 g Topical TID   escitalopram  5 mg Oral Daily   ferrous sulfate  325 mg Oral BID WC   gabapentin  200 mg Oral TID   guaiFENesin  600 mg Oral BID   Ipratropium-Albuterol  2 puff Inhalation BID   melatonin  5 mg Oral QHS   mouth rinse  15 mL Mouth Rinse 4 times per day   pantoprazole  40 mg Oral Daily   potassium chloride  10 mEq Oral Daily   simvastatin  10 mg Oral q1800   torsemide  20 mg Oral Daily   Continuous Infusions:  sodium chloride Stopped (02/26/22 1159)     LOS: 33 days    Time spent: 50 mins    Dicky Boer, MD [facility]   If 7PM-7AM, please contact night-coverage

## 2022-03-09 DIAGNOSIS — I5033 Acute on chronic diastolic (congestive) heart failure: Secondary | ICD-10-CM | POA: Diagnosis not present

## 2022-03-09 LAB — CBC
HCT: 36.8 % — ABNORMAL LOW (ref 39.0–52.0)
Hemoglobin: 10 g/dL — ABNORMAL LOW (ref 13.0–17.0)
MCH: 21.2 pg — ABNORMAL LOW (ref 26.0–34.0)
MCHC: 27.2 g/dL — ABNORMAL LOW (ref 30.0–36.0)
MCV: 78 fL — ABNORMAL LOW (ref 80.0–100.0)
Platelets: 196 10*3/uL (ref 150–400)
RBC: 4.72 MIL/uL (ref 4.22–5.81)
RDW: 18.6 % — ABNORMAL HIGH (ref 11.5–15.5)
WBC: 8.6 10*3/uL (ref 4.0–10.5)
nRBC: 0 % (ref 0.0–0.2)

## 2022-03-09 NOTE — Progress Notes (Signed)
Occupational Therapy Treatment Patient Details Name: Gary [name] MRN: [id number] DOB: 21-May-1955 Today's Date: 03/09/2022   History of present illness Pt is a 66 y.o. 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  Prolonged admittances earlier this year for similar. MD assessment includes: Acute on chronic diastolic CHF, AKI, and acute on chronic respiratory failure with hypoxia.   OT comments  Gary [name] was seen for OT/PT co-treatment on this date. Upon arrival to room pt seated EOB, agreeable to tx. Pt requires MIN A x2 + RW for 3 trials sit<>stand, max time 70 sec. Pt making good progress toward goals, will continue to follow POC. Discharge recommendation remains appropriate.     Recommendations for follow up therapy are one component of a multi-disciplinary discharge planning process, led by the attending physician.  Recommendations may be updated based on patient status, additional functional criteria and insurance authorization.    Follow Up Recommendations  Skilled nursing-short term rehab (<3 hours/day)     Assistance Recommended at Discharge Frequent or constant Supervision/Assistance  Patient can return home with the following  Two people to help with walking and/or transfers;Two people to help with bathing/dressing/bathroom   Equipment Recommendations  Hospital bed    Recommendations for Other Services      Precautions / Restrictions Precautions Precautions: Fall Restrictions Weight Bearing Restrictions: Yes RLE Weight Bearing: Weight bearing as tolerated       Mobility Bed Mobility Overal bed mobility: Needs Assistance Bed Mobility: Supine to Sit, Sit to Supine Rolling: Min guard   Supine to sit: Supervision Sit to supine: Max assist   General bed mobility comments: assistance for LE support to return to bed. verbal cues for technique     Transfers Overall transfer level: Needs assistance Equipment used: Rolling walker (2 wheels) (bariatric RW) Transfers: Sit to/from Stand Sit to Stand: Min assist, +2 physical assistance, From elevated surface           General transfer comment: x3     Balance Overall balance assessment: Needs assistance Sitting-balance support: Feet supported Sitting balance-[name] Scale: Good Sitting balance - Comments: supervision for safety   Standing balance support: Bilateral upper extremity supported, Reliant on assistive device for balance Standing balance-[name] Scale: Poor Standing balance comment: cues for upright posture                           ADL either performed or assessed with clinical judgement   ADL Overall ADL's : Needs assistance/impaired                                       General ADL Comments: MAX A don B shoes      Cognition Arousal/Alertness: Awake/alert Behavior During Therapy: WFL for tasks assessed/performed Overall Cognitive Status: Within Functional Limits for tasks assessed                                           Pertinent Vitals/ Pain       Pain Assessment Pain Assessment: No/denies pain   Frequency  Min 2X/week        Progress Toward Goals  OT Goals(current goals can now be  found in the care plan section)  Progress towards OT goals: Progressing toward goals  Acute Rehab OT Goals Patient Stated Goal: to go home OT Goal Formulation: With patient Time For Goal Achievement: 03/20/22 Potential to Achieve Goals: Fair ADL Goals Pt Will Perform Grooming: with modified independence;sitting Pt Will Perform Lower Body Dressing: sitting/lateral leans;with min assist Pt Will Transfer to Toilet: with min assist;bedside commode;stand pivot transfer  Plan Discharge plan remains appropriate;Frequency remains appropriate    Co-evaluation    PT/OT/SLP Co-Evaluation/Treatment: Yes Reason for  Co-Treatment: Complexity of the patient's impairments (multi-system involvement) PT goals addressed during session: Mobility/safety with mobility OT goals addressed during session: ADL's and self-care      AM-PAC OT "6 Clicks" Daily Activity     Outcome Measure   Help from another person eating meals?: None Help from another person taking care of personal grooming?: A Little Help from another person toileting, which includes using toliet, bedpan, or urinal?: A Lot Help from another person bathing (including washing, rinsing, drying)?: A Lot Help from another person to put on and taking off regular upper body clothing?: A Little Help from another person to put on and taking off regular lower body clothing?: A Lot 6 Click Score: 16    End of Session    OT Visit Diagnosis: Other abnormalities of gait and mobility (R26.89);Muscle weakness (generalized) (M62.81)   Activity Tolerance Patient tolerated treatment well   Patient Left in bed;with call bell/phone within reach   Nurse Communication Mobility status        Time: 1403-1430 OT Time Calculation (min): 27 min  Charges: OT General Charges $OT Visit: 1 Visit OT Treatments $Self Care/Home Management : 8-22 mins  [name], M.S. OTR/L  03/09/22, 3:50 PM  ascom [phone]

## 2022-03-09 NOTE — Progress Notes (Signed)
Physical Therapy Treatment Patient Details Name: Gary [name] MRN: [id number] DOB: 24-Dec-1955 Today's Date: 03/09/2022   History of Present Illness Pt is a 66 y.o. 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  Prolonged admittances earlier this year for similar. MD assessment includes: Acute on chronic diastolic CHF, AKI, and acute on chronic respiratory failure with hypoxia.    PT Comments    Patient is agreeable to PT session. He was able to stand  x 3 bouts with standing tolerance of 70 seconds, 45 seconds, 30 seconds. Emphasis on increasing standing tolerance each session in preparation for ambulation attempts. Unable to stand longer periods due to fatigue with activity. Respiration rate up to 34 with activity. Recommend PT follow up to maximize independence and decrease caregiver burden.    Recommendations for follow up therapy are one component of a multi-disciplinary discharge planning process, led by the attending physician.  Recommendations may be updated based on patient status, additional functional criteria and insurance authorization.  Follow Up Recommendations  Skilled nursing-short term rehab (<3 hours/day) Can patient physically be transported by private vehicle: No   Assistance Recommended at Discharge Frequent or constant Supervision/Assistance  Patient can return home with the following Two people to help with walking and/or transfers;Two people to help with bathing/dressing/bathroom;Assistance with cooking/housework;Assist for transportation;Help with stairs or ramp for entrance   Equipment Recommendations  None recommended by PT    Recommendations for Other Services       Precautions / Restrictions Precautions Precautions: Fall Restrictions Weight Bearing Restrictions: Yes RLE Weight Bearing: Weight bearing as tolerated     Mobility  Bed  Mobility Overal bed mobility: Needs Assistance Bed Mobility: Supine to Sit, Sit to Supine Rolling: Min guard   Supine to sit: Supervision Sit to supine: Max assist   General bed mobility comments: assistance for LE support to return to bed. verbal cues for technique    Transfers Overall transfer level: Needs assistance Equipment used: Rolling walker (2 wheels) (bariatric RW) Transfers: Sit to/from Stand Sit to Stand: Min assist, +2 physical assistance, From elevated surface           General transfer comment: 3 bouts of standing performed with cues for LE positioning and anterior weight shifting.    Ambulation/Gait             Pre-gait activities: focus on erect posture, hip extension, and increasing standing tolerance in preparation for ambulation. standing tolerance was 70 seconds, 45 seconds, 30 seconds with seated rest break between bouts of standing. respiration rate up to 34 during standing bout     Stairs             Wheelchair Mobility    Modified Rankin (Stroke Patients Only)       Balance Overall balance assessment: Needs assistance Sitting-balance support: Feet supported Sitting balance-[name] Scale: Good Sitting balance - Comments: supervision for safety   Standing balance support: Bilateral upper extremity supported, Reliant on assistive device for balance Standing balance-[name] Scale: Poor Standing balance comment: faciliation for anterior weight shifitng initially                            Cognition Arousal/Alertness: Awake/alert Behavior During Therapy: WFL for tasks assessed/performed Overall Cognitive Status: Within Functional Limits for tasks assessed  Exercises      General Comments        Pertinent Vitals/Pain Pain Assessment Pain Assessment: No/denies pain    Home Living                          Prior Function            PT Goals  (current goals can now be found in the care plan section) Acute Rehab PT Goals Patient Stated Goal: to be able to walk PT Goal Formulation: With patient Time For Goal Achievement: 03/23/22 Potential to Achieve Goals: Fair Progress towards PT goals: Progressing toward goals (extended care plan and goals remain appropriate)    Frequency    Min 2X/week      PT Plan Current plan remains appropriate    Co-evaluation PT/OT/SLP Co-Evaluation/Treatment: Yes Reason for Co-Treatment: Complexity of the patient's impairments (multi-system involvement);To address functional/ADL transfers;For patient/therapist safety PT goals addressed during session: Mobility/safety with mobility;Strengthening/ROM        AM-PAC PT "6 Clicks" Mobility   Outcome Measure  Help needed turning from your back to your side while in a flat bed without using bedrails?: A Lot Help needed moving from lying on your back to sitting on the side of a flat bed without using bedrails?: A Lot Help needed moving to and from a bed to a chair (including a wheelchair)?: A Lot Help needed standing up from a chair using your arms (e.g., wheelchair or bedside chair)?: A Lot Help needed to walk in hospital room?: Total Help needed climbing 3-5 steps with a railing? : Total 6 Click Score: 10    End of Session         PT Visit Diagnosis: Unsteadiness on feet (R26.81);Muscle weakness (generalized) (M62.81);Difficulty in walking, not elsewhere classified (R26.2)     Time: 1355-1430 PT Time Calculation (min) (ACUTE ONLY): 35 min  Charges:  $Therapeutic Activity: 8-22 mins                     [name], PT, MPT    [name] 03/09/2022, 3:02 PM

## 2022-03-09 NOTE — TOC Progression Note (Signed)
Transition of Care [facility]) - Progression Note    Patient Details  Name: Gary [name] MRN: [id number] Date of Birth: [birth year]  Transition of Care [facility]) CM/SW Contact  Shelbie Hutching, RN Phone Number: 03/09/2022, 1:44 PM  Clinical Narrative:    Ronnette Hila, no one in admissions today.  Re-faxed referral for placement.  Hoping they could at least place him on wait list.     Expected Discharge Plan: [place] Barriers to Discharge: Continued Medical Work up, Ship broker, Unsafe home situation  Expected Discharge Plan and Services Expected Discharge Plan: [place] In-house Referral: Clinical Social Work     Living arrangements for the past 2 months: Single Family Home                                       Social Determinants of Health (SDOH) Interventions    Readmission Risk Interventions    12/31/2021    8:58 AM 12/20/2021    4:21 PM 05/04/2021    1:26 PM  Readmission Risk Prevention Plan  Transportation Screening Complete Complete Complete  Medication Review Press photographer) Complete Complete Complete  PCP or Specialist appointment within 3-5 days of discharge Complete Complete Complete  HRI or [place] Complete Complete Complete  SW Recovery Care/Counseling Consult Complete Complete Complete  Palliative Care Screening Complete Complete Not [place] Not Applicable Not Applicable Not Applicable

## 2022-03-09 NOTE — Progress Notes (Signed)
Gary [name]  GUR:[id number] DOB: [birth year] DOA: 01/23/2022 PCP: [facility] [place]   Brief Narrative:  This 66 years old male with medical history significant for diastolic CHF, chronic respiratory failure status post tracheostomy, COPD, essential hypertension, PUD and sleep apnea who presented to the emergency room with acute onset of worsening dyspnea.  Apparently patient has been in the ER for the past 10 days as he was unable to be cared at home specially requirement of tracheostomy care, awaiting placement. He developed worsening pulmonary edema later being admitted for management of acute on chronic diastolic heart failure and given IV Lasix.  Patient condition has gradually improved.  He was able to tolerate nocturnal trilogy ventilation.  Currently pending nursing placement.  Assessment & Plan:   Principal Problem:   Acute on chronic diastolic (congestive) heart failure ([name]) Active Problems:   Acute on chronic respiratory failure with hypoxia ([name])   AKI (acute kidney injury) ([name])   Chronic obstructive pulmonary disease (COPD) ([name])   Obesity, Class III, BMI 40-49.9 (morbid obesity) ([place])   GERD without esophagitis   Anxiety and depression   Dyslipidemia   Hypokalemia  Acute on chronic diastolic heart failure: Acute on chronic hypoxic and hypercapnic respiratory failure secondary to advanced COPD Tracheostomy tube dependence: Condition had improved. Currently on oral torsemide.Trach exchange performed on 11/14. Patient doing well, condition more stabilized.  Currently pending nursing home placement. However, patient appears to be progressing well with physical therapy.   Acute kidney injury: Could be due to diuretic use.  Renal functions are now back to baseline. AKI resolved.   GERD without esophagitis: Continue pantoprazole 40 mg daily.   Obesity, Class III, BMI 40-49.9 (morbid obesity) ([place]) Estimated body mass index is 48.12  kg/m as calculated from the following:   Height as of this encounter: '6\' 3"'$  (1.905 m).   Weight as of this encounter: 174.6 kg.    Anxiety and depression Continue Lexapro.   Dyslipidemia: Continue statin therapy..   Microcytic normochromic anemia Iron deficiency anemia. Continue iron supplementation. s/p IV iron x3 doses   Right knee pain secondary to lateral and medial meniscal tear. MRI knee confirms.--ortho consulted, most likely require a total knee replacement to address both the meniscal pathology and the degenerative changes, however, pt currently is not a surgical candidate. --s/p Kenalog injection on 11/10 with improved symptoms.   Hypokalemia: Replaced and resolved.   Awaiting for nursing home placement.   DVT prophylaxis: Eliquis Code Status: Full code Family Communication: No family at bedside Disposition Plan:   Status is: Inpatient Remains inpatient appropriate because: Admitted for acute on chronic hypoxic and hypercapnic respiratory secondary to acute on chronic diastolic CHF exacerbation requiring diuresis.  Patient is now medically clear awaiting SNF placement.    Consultants:  PCCM  Procedures: None Antimicrobials:  Anti-infectives (From admission, onward)    None       Subjective: Patient was seen and examined at bedside.  Overnight events noted.   Patient reports doing better, denies any complaint.  He is awaiting SNF placement.    Objective: Vitals:   03/09/22 0800 03/09/22 0900 03/09/22 1000 03/09/22 1100  BP: 107/73     Pulse: 83 86 83 82  Resp: 19 (!) 24 (!) 21 17  Temp: 97.9 F (36.6 C)     TempSrc: Oral     SpO2: 94% 94% 94% 94%  Weight:      Height:  Intake/Output Summary (Last 24 hours) at 03/09/2022 1406 Last data filed at 03/09/2022 0400 Gross per 24 hour  Intake --  Output 1950 ml  Net -1950 ml   Filed Weights   03/01/22 0442 03/02/22 0352 03/03/22 0500  Weight: (!) 181.3 kg (!) 181.4 kg (!) 186.4 kg     Examination:  General exam: Appears comfortable, not in any acute distress, morbidly obese. Respiratory system: CTA bilaterally, respiratory effort normal, RR 13. Cardiovascular system: S1-S2 heard, regular rate and rhythm, no murmur. Gastrointestinal system: Abdomen is soft, non tender, non distended, BS+ Central nervous system: Alert and oriented x 3. No focal neurological deficits. Extremities: No edema, no cyanosis, no clubbing Skin: No rashes, lesions or ulcers Psychiatry: Judgement and insight appear normal. Mood & affect appropriate.     Data Reviewed: I have personally reviewed following labs and imaging studies  CBC: Recent Labs  Lab 03/09/22 0311  WBC 8.6  HGB 10.0*  HCT 36.8*  MCV 78.0*  PLT 081   Basic Metabolic Panel: No results for input(s): "NA", "K", "CL", "CO2", "GLUCOSE", "BUN", "CREATININE", "CALCIUM", "MG", "PHOS" in the last 168 hours.  GFR: Estimated Creatinine Clearance: 106.7 mL/min (by C-G formula based on SCr of 1.24 mg/dL). Liver Function Tests: No results for input(s): "AST", "ALT", "ALKPHOS", "BILITOT", "PROT", "ALBUMIN" in the last 168 hours. No results for input(s): "LIPASE", "AMYLASE" in the last 168 hours. No results for input(s): "AMMONIA" in the last 168 hours. Coagulation Profile: No results for input(s): "INR", "PROTIME" in the last 168 hours. Cardiac Enzymes: No results for input(s): "CKTOTAL", "CKMB", "CKMBINDEX", "TROPONINI" in the last 168 hours. BNP (last 3 results) No results for input(s): "PROBNP" in the last 8760 hours. HbA1C: No results for input(s): "HGBA1C" in the last 72 hours. CBG: No results for input(s): "GLUCAP" in the last 168 hours. Lipid Profile: No results for input(s): "CHOL", "HDL", "LDLCALC", "TRIG", "CHOLHDL", "LDLDIRECT" in the last 72 hours. Thyroid Function Tests: No results for input(s): "TSH", "T4TOTAL", "FREET4", "T3FREE", "THYROIDAB" in the last 72 hours. Anemia Panel: No results for input(s):  "VITAMINB12", "FOLATE", "FERRITIN", "TIBC", "IRON", "RETICCTPCT" in the last 72 hours. Sepsis Labs: No results for input(s): "PROCALCITON", "LATICACIDVEN" in the last 168 hours.  No results found for this or any previous visit (from the past 240 hour(s)).   Radiology Studies: No results found.  Scheduled Meds:  ammonium lactate   Topical BID   apixaban  2.5 mg Oral BID   Chlorhexidine Gluconate Cloth  6 each Topical Q0600   vitamin B-12  500 mcg Oral Daily   diclofenac Sodium  2 g Topical TID   escitalopram  5 mg Oral Daily   ferrous sulfate  325 mg Oral BID WC   gabapentin  200 mg Oral TID   guaiFENesin  600 mg Oral BID   Ipratropium-Albuterol  2 puff Inhalation BID   melatonin  5 mg Oral QHS   mouth rinse  15 mL Mouth Rinse 4 times per day   pantoprazole  40 mg Oral Daily   potassium chloride  10 mEq Oral Daily   simvastatin  10 mg Oral q1800   torsemide  20 mg Oral Daily   Continuous Infusions:  sodium chloride Stopped (02/26/22 1159)     LOS: 34 days    Time spent:35 mins    Shawna Clamp, MD [facility]   If 7PM-7AM, please contact night-coverage

## 2022-03-10 DIAGNOSIS — I5033 Acute on chronic diastolic (congestive) heart failure: Secondary | ICD-10-CM | POA: Diagnosis not present

## 2022-03-10 NOTE — Progress Notes (Signed)
Patient refusing to wear trilogy vent over night due to beeping that the machine is doing. RT attempted to fix beeping with no success due to the machines error.

## 2022-03-10 NOTE — Progress Notes (Signed)
PROGRESS NOTE    Gary [name]  YQM:[id number] DOB: [birth year] DOA: 01/23/2022 PCP: [facility] [place]   Brief Narrative:  This 66 years old male with medical history significant for diastolic CHF, chronic respiratory failure status post tracheostomy, COPD, essential hypertension, PUD and sleep apnea who presented to the emergency room with acute onset of worsening dyspnea.  Apparently patient has been in the ER for the past 10 days as he was unable to be cared at home specially requirement of tracheostomy care, awaiting placement. He developed worsening pulmonary edema later being admitted for management of acute on chronic diastolic heart failure and given IV Lasix.  Patient condition has gradually improved.  He was able to tolerate nocturnal trilogy ventilation.  Currently pending nursing placement.  Assessment & Plan:   Principal Problem:   Acute on chronic diastolic (congestive) heart failure ([name]) Active Problems:   Acute on chronic respiratory failure with hypoxia ([name])   AKI (acute kidney injury) ([name])   Chronic obstructive pulmonary disease (COPD) ([name])   Obesity, Class III, BMI 40-49.9 (morbid obesity) ([place])   GERD without esophagitis   Anxiety and depression   Dyslipidemia   Hypokalemia  Acute on chronic diastolic heart failure: Acute on chronic hypoxic and hypercapnic respiratory failure secondary to advanced COPD Tracheostomy tube dependence: Condition had improved. Currently on oral torsemide.Trach exchange performed on 11/14. Patient doing well, condition more stabilized.  Currently pending nursing home placement. However, patient appears to be progressing well with physical therapy.   Acute kidney injury: Could be due to diuretic use.  Renal functions are now back to baseline. AKI resolved.   GERD without esophagitis: Continue pantoprazole 40 mg daily.   Obesity, Class III, BMI 40-49.9 (morbid obesity) ([place]) Estimated body mass index is 48.12  kg/m as calculated from the following:   Height as of this encounter: '6\' 3"'$  (1.905 m).   Weight as of this encounter: 174.6 kg.    Anxiety and depression Continue Lexapro.   Dyslipidemia: Continue statin therapy..   Microcytic normochromic anemia Iron deficiency anemia. Continue iron supplementation. s/p IV iron x3 doses   Right knee pain secondary to lateral and medial meniscal tear. MRI knee confirms.--ortho consulted, most likely require a total knee replacement to address both the meniscal pathology and the degenerative changes, however, pt currently is not a surgical candidate. --s/p Kenalog injection on 11/10 with improved symptoms.   Hypokalemia: Replaced and resolved.   Awaiting for nursing home placement.   DVT prophylaxis: Eliquis Code Status: Full code Family Communication: No family at bedside Disposition Plan:   Status is: Inpatient Remains inpatient appropriate because: Admitted for acute on chronic hypoxic and hypercapnic respiratory secondary to acute on chronic diastolic CHF exacerbation requiring diuresis.  Patient is now medically clear awaiting SNF placement.    Consultants:  PCCM  Procedures: None Antimicrobials:  Anti-infectives (From admission, onward)    None       Subjective: Patient was seen and examined at bedside.  Overnight events noted. Patient reports doing much better, denies any complaints.  He is awaiting SNF placement.  Objective: Vitals:   03/10/22 0500 03/10/22 0755 03/10/22 0800 03/10/22 1200  BP:  112/76  112/79  Pulse:  79  80  Resp:  (!) 21  (!) 22  Temp:   97.7 F (36.5 C) 98.3 F (36.8 C)  TempSrc:   Oral Oral  SpO2:  97%    Weight: (!) 186.4 kg     Height:  Intake/Output Summary (Last 24 hours) at 03/10/2022 1345 Last data filed at 03/10/2022 1200 Gross per 24 hour  Intake 480 ml  Output 3150 ml  Net -2670 ml   Filed Weights   03/02/22 0352 03/03/22 0500 03/10/22 0500  Weight: (!) 181.4 kg (!)  186.4 kg (!) 186.4 kg    Examination:  General exam: Morbidly obese, appears comfortable, not in any acute distress. Respiratory system: CTA bilaterally, respiratory effort normal, RR 13. Cardiovascular system: S1-S2 heard, regular rate and rhythm, no murmur. Gastrointestinal system: Abdomen is soft, non tender, non distended, BS+ Central nervous system: Alert and oriented x 3. No focal neurological deficits. Extremities: No edema, no cyanosis, no clubbing Skin: No rashes, lesions or ulcers Psychiatry: Judgement and insight appear normal. Mood & affect appropriate.     Data Reviewed: I have personally reviewed following labs and imaging studies  CBC: Recent Labs  Lab 03/09/22 0311  WBC 8.6  HGB 10.0*  HCT 36.8*  MCV 78.0*  PLT 300   Basic Metabolic Panel: No results for input(s): "NA", "K", "CL", "CO2", "GLUCOSE", "BUN", "CREATININE", "CALCIUM", "MG", "PHOS" in the last 168 hours.  GFR: Estimated Creatinine Clearance: 106.7 mL/min (by C-G formula based on SCr of 1.24 mg/dL). Liver Function Tests: No results for input(s): "AST", "ALT", "ALKPHOS", "BILITOT", "PROT", "ALBUMIN" in the last 168 hours. No results for input(s): "LIPASE", "AMYLASE" in the last 168 hours. No results for input(s): "AMMONIA" in the last 168 hours. Coagulation Profile: No results for input(s): "INR", "PROTIME" in the last 168 hours. Cardiac Enzymes: No results for input(s): "CKTOTAL", "CKMB", "CKMBINDEX", "TROPONINI" in the last 168 hours. BNP (last 3 results) No results for input(s): "PROBNP" in the last 8760 hours. HbA1C: No results for input(s): "HGBA1C" in the last 72 hours. CBG: No results for input(s): "GLUCAP" in the last 168 hours. Lipid Profile: No results for input(s): "CHOL", "HDL", "LDLCALC", "TRIG", "CHOLHDL", "LDLDIRECT" in the last 72 hours. Thyroid Function Tests: No results for input(s): "TSH", "T4TOTAL", "FREET4", "T3FREE", "THYROIDAB" in the last 72 hours. Anemia Panel: No  results for input(s): "VITAMINB12", "FOLATE", "FERRITIN", "TIBC", "IRON", "RETICCTPCT" in the last 72 hours. Sepsis Labs: No results for input(s): "PROCALCITON", "LATICACIDVEN" in the last 168 hours.  No results found for this or any previous visit (from the past 240 hour(s)).   Radiology Studies: No results found.  Scheduled Meds:  ammonium lactate   Topical BID   apixaban  2.5 mg Oral BID   Chlorhexidine Gluconate Cloth  6 each Topical Q0600   vitamin B-12  500 mcg Oral Daily   diclofenac Sodium  2 g Topical TID   escitalopram  5 mg Oral Daily   ferrous sulfate  325 mg Oral BID WC   gabapentin  200 mg Oral TID   guaiFENesin  600 mg Oral BID   Ipratropium-Albuterol  2 puff Inhalation BID   melatonin  5 mg Oral QHS   mouth rinse  15 mL Mouth Rinse 4 times per day   pantoprazole  40 mg Oral Daily   potassium chloride  10 mEq Oral Daily   simvastatin  10 mg Oral q1800   torsemide  20 mg Oral Daily   Continuous Infusions:  sodium chloride Stopped (02/26/22 1159)     LOS: 35 days    Time spent:35 mins    Shawna Clamp, MD [facility]   If 7PM-7AM, please contact night-coverage

## 2022-03-11 DIAGNOSIS — I5033 Acute on chronic diastolic (congestive) heart failure: Secondary | ICD-10-CM | POA: Diagnosis not present

## 2022-03-11 NOTE — Progress Notes (Signed)
Home vent on standby. Patient has decided not to use tonight. States will contact home care company to look at. Vent alarms low mv/apnea. Vent noted to be in pressure support mode. Patient has cuffless trach. Patient states vent alarms some nights consistently and some not at all. Patient noted to be appropriate in conversations. Patients have noted to be stable. Patient remains on 28% ATC in no distress. Will continue to monitor.

## 2022-03-11 NOTE — Progress Notes (Signed)
PROGRESS NOTE    Gary [name]  BTD:[id number] DOB: 04-Sep-1955 DOA: 01/23/2022 [facility]CP: [facility] [place]   Brief Narrative:  This 66 years old male with medical history significant for diastolic CHF, chronic respiratory failure status post tracheostomy, COPD, essential hypertension, PUD and sleep apnea who presented to the emergency room with acute onset of worsening dyspnea.  Apparently patient has been in the ER for the past 10 days as he was unable to be cared at home specially requirement of tracheostomy care, awaiting placement. He developed worsening pulmonary edema later being admitted for management of acute on chronic diastolic heart failure and given IV Lasix.  Patient condition has gradually improved.  He was able to tolerate nocturnal trilogy ventilation.  Currently pending nursing placement.  Assessment & Plan:   Principal Problem:   Acute on chronic diastolic (congestive) heart failure ([name]) Active Problems:   Acute on chronic respiratory failure with hypoxia ([name])   AKI (acute kidney injury) ([name])   Chronic obstructive pulmonary disease (COPD) ([name])   Obesity, Class III, BMI 40-49.9 (morbid obesity) ([place])   GERD without esophagitis   Anxiety and depression   Dyslipidemia   Hypokalemia  Acute on chronic diastolic heart failure: Acute on chronic hypoxic and hypercapnic respiratory failure secondary to advanced COPD Tracheostomy tube dependence: Condition had improved. Currently on oral torsemide.Trach exchange performed on 11/14. Patient doing well, condition more stabilized.  Currently pending nursing home placement. However, patient appears to be progressing well with physical therapy.   Acute kidney injury: > Resolved Could be due to diuretic use. Renal functions are now back to baseline. AKI resolved.   GERD without esophagitis: Continue pantoprazole 40 mg daily.   Obesity, Class III, BMI 40-49.9 (morbid obesity) ([place]) Estimated body mass index is  48.12 kg/m as calculated from the following:   Height as of this encounter: '6\' 3"'$  (1.905 m).   Weight as of this encounter: 174.6 kg.    Anxiety and depression Continue Lexapro.   Dyslipidemia: Continue statin therapy..   Microcytic normochromic anemia Iron deficiency anemia. Continue iron supplementation. s/p IV iron x3 doses   Right knee pain secondary to lateral and medial meniscal tear. MRI knee confirms.--ortho consulted, most likely require a total knee replacement to address both the meniscal pathology and the degenerative changes, however, pt currently is not a surgical candidate. --s/p Kenalog injection on 11/10 with improved symptoms.   Hypokalemia: Replaced and resolved.   Awaiting for nursing home placement.   DVT prophylaxis: Eliquis Code Status: Full code Family Communication: No family at bedside Disposition Plan:   Status is: Inpatient Remains inpatient appropriate because: Admitted for acute on chronic hypoxic and hypercapnic respiratory secondary to acute on chronic diastolic CHF exacerbation requiring diuresis.  Patient is now medically clear awaiting SNF placement.   Consultants:  PCCM  Procedures: None Antimicrobials:  Anti-infectives (From admission, onward)    None       Subjective: Patient was seen and examined at bedside.  Overnight events noted. Patient reports doing much better.  Overnight he has refused to use Trelegy. He denies any other complaint.  He is awaiting SNF placement.  Objective: Vitals:   03/11/22 0000 03/11/22 0400 03/11/22 0500 03/11/22 0742  BP: 114/80 110/78  117/74  Pulse: 86   76  Resp: (!) 26 19  (!) 23  Temp: 98 F (36.7 C) 98.6 F (37 C)  97.6 F (36.4 C)  TempSrc: Oral Oral  Oral  SpO2: 98% 99%  96%  Weight:   [name])  185.9 kg   Height:        Intake/Output Summary (Last 24 hours) at 03/11/2022 1221 Last data filed at 03/11/2022 0224 Gross per 24 hour  Intake --  Output 2375 ml  Net -2375 ml    Filed Weights   03/03/22 0500 03/10/22 0500 03/11/22 0500  Weight: (!) 186.4 kg (!) 186.4 kg (!) 185.9 kg    Examination:  General exam: Morbidly obese, appears comfortable, not in any acute distress. Respiratory system: CTA bilaterally, respiratory effort normal, RR 13. Cardiovascular system: S1-S2 heard, regular rate and rhythm, no murmur. Gastrointestinal system: Abdomen is soft, non tender, non distended, BS+ Central nervous system: Alert and oriented x 3. No focal neurological deficits. Extremities: No edema, no cyanosis, no clubbing Skin: No rashes, lesions or ulcers Psychiatry: Judgement and insight appear normal. Mood & affect appropriate.     Data Reviewed: I have personally reviewed following labs and imaging studies  CBC: Recent Labs  Lab 03/09/22 0311  WBC 8.6  HGB 10.0*  HCT 36.8*  MCV 78.0*  PLT 417   Basic Metabolic Panel: No results for input(s): "NA", "K", "CL", "CO2", "GLUCOSE", "BUN", "CREATININE", "CALCIUM", "MG", "PHOS" in the last 168 hours.  GFR: Estimated Creatinine Clearance: 106.5 mL/min (by C-G formula based on SCr of 1.24 mg/dL). Liver Function Tests: No results for input(s): "AST", "ALT", "ALKPHOS", "BILITOT", "PROT", "ALBUMIN" in the last 168 hours. No results for input(s): "LIPASE", "AMYLASE" in the last 168 hours. No results for input(s): "AMMONIA" in the last 168 hours. Coagulation Profile: No results for input(s): "INR", "PROTIME" in the last 168 hours. Cardiac Enzymes: No results for input(s): "CKTOTAL", "CKMB", "CKMBINDEX", "TROPONINI" in the last 168 hours. BNP (last 3 results) No results for input(s): "PROBNP" in the last 8760 hours. HbA1C: No results for input(s): "HGBA1C" in the last 72 hours. CBG: No results for input(s): "GLUCAP" in the last 168 hours. Lipid Profile: No results for input(s): "CHOL", "HDL", "LDLCALC", "TRIG", "CHOLHDL", "LDLDIRECT" in the last 72 hours. Thyroid Function Tests: No results for input(s):  "TSH", "T4TOTAL", "FREET4", "T3FREE", "THYROIDAB" in the last 72 hours. Anemia Panel: No results for input(s): "VITAMINB12", "FOLATE", "FERRITIN", "TIBC", "IRON", "RETICCTPCT" in the last 72 hours. Sepsis Labs: No results for input(s): "PROCALCITON", "LATICACIDVEN" in the last 168 hours.  No results found for this or any previous visit (from the past 240 hour(s)).   Radiology Studies: No results found.  Scheduled Meds:  ammonium lactate   Topical BID   apixaban  2.5 mg Oral BID   Chlorhexidine Gluconate Cloth  6 each Topical Q0600   vitamin B-12  500 mcg Oral Daily   diclofenac Sodium  2 g Topical TID   escitalopram  5 mg Oral Daily   ferrous sulfate  325 mg Oral BID WC   gabapentin  200 mg Oral TID   guaiFENesin  600 mg Oral BID   Ipratropium-Albuterol  2 puff Inhalation BID   melatonin  5 mg Oral QHS   mouth rinse  15 mL Mouth Rinse 4 times per day   pantoprazole  40 mg Oral Daily   potassium chloride  10 mEq Oral Daily   simvastatin  10 mg Oral q1800   torsemide  20 mg Oral Daily   Continuous Infusions:  sodium chloride Stopped (02/26/22 1159)     LOS: 36 days    Time spent:35 mins    Shawna Clamp, MD [facility]   If 7PM-7AM, please contact night-coverage

## 2022-03-12 DIAGNOSIS — I5033 Acute on chronic diastolic (congestive) heart failure: Secondary | ICD-10-CM | POA: Diagnosis not present

## 2022-03-12 NOTE — Progress Notes (Signed)
Occupational Therapy Treatment Patient Details Name: Gary [name] MRN: [id number] DOB: 10-30-1955 Today's Date: 03/12/2022   History of present illness Pt is a 66 y.o. 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  Prolonged admittances earlier this year for similar. MD assessment includes: Acute on chronic diastolic CHF, AKI, and acute on chronic respiratory failure with hypoxia.   OT comments  Gary [name] was seen for OT/PT co-treatment on this date. Upon arrival to room pt reclined in bed, agreeable to tx. Pt requires MIN A exit bed. MAX A don B shoes at EOB. MOD A x2 standing at EOB with narrow BOS for x2 attempt at standing scale. MAX A return to bed. MAX A pericare at bed level. Pt making good progress toward goals, will continue to follow POC. Discharge recommendation remains appropriate.     Recommendations for follow up therapy are one component of a multi-disciplinary discharge planning process, led by the attending physician.  Recommendations may be updated based on patient status, additional functional criteria and insurance authorization.    Follow Up Recommendations  Skilled nursing-short term rehab (<3 hours/day)     Assistance Recommended at Discharge Frequent or constant Supervision/Assistance  Patient can return home with the following  Two people to help with walking and/or transfers;Two people to help with bathing/dressing/bathroom   Equipment Recommendations  Hospital bed    Recommendations for Other Services      Precautions / Restrictions Precautions Precautions: Fall Restrictions Weight Bearing Restrictions: No RLE Weight Bearing: Weight bearing as tolerated       Mobility Bed Mobility Overal bed mobility: Needs Assistance Bed Mobility: Supine to Sit, Sit to Supine Rolling: Min assist   Supine to sit: Min assist Sit to supine: Max  assist   General bed mobility comments: rolling assistance provided with rolling to the left. cues for technique. assistance required today for trunk support to sit upright. assistance for BLE support to return to bed. increased time and effort required to complete tasks    Transfers Overall transfer level: Needs assistance Equipment used: Rolling walker (2 wheels) Transfers: Sit to/from Stand Sit to Stand: Min assist, +2 physical assistance, From elevated surface, Mod assist           General transfer comment: attempted to get a standing weight, however machine did not read while patient standing. increased time required with increased difficulty compared to usual due to more narrow base of support (on the scale). patient stood x 2 bouts with verbal cues for anterior weight shifting     Balance Overall balance assessment: Needs assistance Sitting-balance support: Feet supported Sitting balance-[name] Scale: Good Sitting balance - Comments: supervision for safety   Standing balance support: Bilateral upper extremity supported, Reliant on assistive device for balance Standing balance-[name] Scale: Poor                             ADL either performed or assessed with clinical judgement   ADL Overall ADL's : Needs assistance/impaired                                       General ADL Comments: MAX A don B shoes. MAX A pericare at bed level      Cognition Arousal/Alertness: Awake/alert Behavior  During Therapy: WFL for tasks assessed/performed Overall Cognitive Status: Within Functional Limits for tasks assessed                                 General Comments: poor insight                   Pertinent Vitals/ Pain       Pain Assessment Pain Assessment: No/denies pain   Frequency  Min 2X/week        Progress Toward Goals  OT Goals(current goals can now be found in the care plan section)  Progress towards OT goals:  Progressing toward goals  Acute Rehab OT Goals Patient Stated Goal: to go home OT Goal Formulation: With patient Time For Goal Achievement: 03/20/22 Potential to Achieve Goals: Fair ADL Goals Pt Will Perform Grooming: with modified independence;sitting Pt Will Perform Lower Body Dressing: sitting/lateral leans;with min assist Pt Will Transfer to Toilet: with min assist;bedside commode;stand pivot transfer  Plan Discharge plan remains appropriate;Frequency remains appropriate    Co-evaluation    PT/OT/SLP Co-Evaluation/Treatment: Yes Reason for Co-Treatment: Complexity of the patient's impairments (multi-system involvement) PT goals addressed during session: Mobility/safety with mobility OT goals addressed during session: ADL's and self-care      AM-PAC OT "6 Clicks" Daily Activity     Outcome Measure   Help from another person eating meals?: None Help from another person taking care of personal grooming?: A Little Help from another person toileting, which includes using toliet, bedpan, or urinal?: A Lot Help from another person bathing (including washing, rinsing, drying)?: A Lot Help from another person to put on and taking off regular upper body clothing?: A Little Help from another person to put on and taking off regular lower body clothing?: A Lot 6 Click Score: 16    End of Session    OT Visit Diagnosis: Other abnormalities of gait and mobility (R26.89);Muscle weakness (generalized) (M62.81)   Activity Tolerance Patient tolerated treatment well   Patient Left in bed;with call bell/phone within reach   Nurse Communication Mobility status        Time: 6269-4854 OT Time Calculation (min): 43 min  Charges: OT General Charges $OT Visit: 1 Visit OT Treatments $Self Care/Home Management : 8-22 mins  [name], M.S. OTR/L  03/12/22, 3:11 PM  ascom [phone]

## 2022-03-12 NOTE — Progress Notes (Addendum)
PROGRESS NOTE    Gary [name]  PNT:[id number] DOB: Jul 11, 1955 DOA: 01/23/2022 PCP: [facility]   Brief Narrative:  This 66 years old male with medical history significant for diastolic CHF, chronic respiratory failure status post tracheostomy, COPD, essential hypertension, PUD and sleep apnea who presented to the emergency room with acute onset of worsening dyspnea.  Apparently patient has been in the ER for the past 10 days as he was unable to be cared at home specially requirement of tracheostomy care, awaiting placement. He developed worsening pulmonary edema later being admitted for management of acute on chronic diastolic heart failure and given IV Lasix.  Patient condition has gradually improved.  He was able to tolerate nocturnal trilogy ventilation.  Currently pending nursing placement.  Assessment & Plan:   Principal Problem:   Acute on chronic diastolic (congestive) heart failure ([name]) Active Problems:   Acute on chronic respiratory failure with hypoxia ([name])   AKI (acute kidney injury) ([name])   Chronic obstructive pulmonary disease (COPD) ([name])   Obesity, Class III, BMI 40-49.9 (morbid obesity) ([place])   GERD without esophagitis   Anxiety and depression   Dyslipidemia   Hypokalemia  Acute on chronic diastolic heart failure: Acute on chronic hypoxic and hypercapnic respiratory failure secondary to advanced COPD Tracheostomy tube dependence: Condition had improved. Currently on oral torsemide.Trach exchange performed on 11/14. Patient doing well, condition more stabilized.  Currently pending nursing home placement. However, patient appears to be progressing well with physical therapy.   Acute kidney injury: > Resolved Could be due to diuretic use. Renal functions are now back to baseline. AKI resolved.   GERD without esophagitis: Continue pantoprazole 40 mg daily.   Obesity, Class III, BMI 40-49.9 (morbid obesity) ([name]) Estimated body mass index is  48.12 kg/m as calculated from the following:   Height as of this encounter: '6\' 3"'$  (1.905 m).   Weight as of this encounter: 174.6 kg.    Anxiety and depression Continue Lexapro.   Dyslipidemia: Continue statin therapy..   Microcytic normochromic anemia Iron deficiency anemia. Continue iron supplementation. s/p IV iron x3 doses   Right knee pain secondary to lateral and medial meniscal tear. MRI knee confirms.--ortho consulted, most likely require a total knee replacement to address both the meniscal pathology and the degenerative changes, however, pt currently is not a surgical candidate. --s/p Kenalog injection on 11/10 with improved symptoms.   Hypokalemia: Replaced and resolved.   Awaiting for nursing home placement.   DVT prophylaxis: Eliquis Code Status: Full code Family Communication: No family at bedside Disposition Plan:   Status is: Inpatient Remains inpatient appropriate because: Admitted for acute on chronic hypoxic and hypercapnic respiratory secondary to acute on chronic diastolic CHF exacerbation requiring diuresis.  Patient is now medically clear awaiting SNF placement.   Consultants:  PCCM  Procedures: None Antimicrobials:  Anti-infectives (From admission, onward)    None       Subjective: Patient was seen and examined at bedside.  Overnight events noted. Patient reports doing much better.  Overnight he has refused to use Trelegy He denies any other complaint.  He is awaiting SNF placement.  Objective: Vitals:   03/12/22 0400 03/12/22 0800 03/12/22 0811 03/12/22 1300  BP: 115/80 103/68  122/71  Pulse:      Resp: '18 20  18  '$ Temp: 98.7 F (37.1 C) 98.5 F (36.9 C)  97.8 F (36.6 C)  TempSrc: Oral Oral  Oral  SpO2: 98% (!) 88% 94% 92%  Weight:  Height:        Intake/Output Summary (Last 24 hours) at 03/12/2022 1437 Last data filed at 03/12/2022 1300 Gross per 24 hour  Intake --  Output 3100 ml  Net -3100 ml   Filed Weights    03/03/22 0500 03/10/22 0500 03/11/22 0500  Weight: (!) 186.4 kg (!) 186.4 kg (!) 185.9 kg    Examination:  General exam: Morbidly obese, appears comfortable, not in any acute distress. Respiratory system: CTA bilaterally, respiratory effort normal, RR 11 Cardiovascular system: S1-S2 heard, regular rate and rhythm, no murmur. Gastrointestinal system: Abdomen is soft, non tender, non distended, BS+ Central nervous system: Alert and oriented x 3. No focal neurological deficits. Extremities: No edema, no cyanosis, no clubbing Skin: No rashes, lesions or ulcers Psychiatry: Judgement and insight appear normal. Mood & affect appropriate.     Data Reviewed: I have personally reviewed following labs and imaging studies  CBC: Recent Labs  Lab 03/09/22 0311  WBC 8.6  HGB 10.0*  HCT 36.8*  MCV 78.0*  PLT 798   Basic Metabolic Panel: No results for input(s): "NA", "K", "CL", "CO2", "GLUCOSE", "BUN", "CREATININE", "CALCIUM", "MG", "PHOS" in the last 168 hours.  GFR: Estimated Creatinine Clearance: 106.5 mL/min (by C-G formula based on SCr of 1.24 mg/dL). Liver Function Tests: No results for input(s): "AST", "ALT", "ALKPHOS", "BILITOT", "PROT", "ALBUMIN" in the last 168 hours. No results for input(s): "LIPASE", "AMYLASE" in the last 168 hours. No results for input(s): "AMMONIA" in the last 168 hours. Coagulation Profile: No results for input(s): "INR", "PROTIME" in the last 168 hours. Cardiac Enzymes: No results for input(s): "CKTOTAL", "CKMB", "CKMBINDEX", "TROPONINI" in the last 168 hours. BNP (last 3 results) No results for input(s): "PROBNP" in the last 8760 hours. HbA1C: No results for input(s): "HGBA1C" in the last 72 hours. CBG: No results for input(s): "GLUCAP" in the last 168 hours. Lipid Profile: No results for input(s): "CHOL", "HDL", "LDLCALC", "TRIG", "CHOLHDL", "LDLDIRECT" in the last 72 hours. Thyroid Function Tests: No results for input(s): "TSH", "T4TOTAL",  "FREET4", "T3FREE", "THYROIDAB" in the last 72 hours. Anemia Panel: No results for input(s): "VITAMINB12", "FOLATE", "FERRITIN", "TIBC", "IRON", "RETICCTPCT" in the last 72 hours. Sepsis Labs: No results for input(s): "PROCALCITON", "LATICACIDVEN" in the last 168 hours.  No results found for this or any previous visit (from the past 240 hour(s)).   Radiology Studies: No results found.  Scheduled Meds:  ammonium lactate   Topical BID   apixaban  2.5 mg Oral BID   Chlorhexidine Gluconate Cloth  6 each Topical Q0600   vitamin B-12  500 mcg Oral Daily   diclofenac Sodium  2 g Topical TID   escitalopram  5 mg Oral Daily   ferrous sulfate  325 mg Oral BID WC   gabapentin  200 mg Oral TID   guaiFENesin  600 mg Oral BID   Ipratropium-Albuterol  2 puff Inhalation BID   melatonin  5 mg Oral QHS   mouth rinse  15 mL Mouth Rinse 4 times per day   pantoprazole  40 mg Oral Daily   potassium chloride  10 mEq Oral Daily   simvastatin  10 mg Oral q1800   torsemide  20 mg Oral Daily   Continuous Infusions:  sodium chloride Stopped (02/26/22 1159)     LOS: 37 days    Time spent: 35 mins    Darin Redmann, MD [facility]   If 7PM-7AM, please contact night-coverage

## 2022-03-12 NOTE — Progress Notes (Signed)
Patient back on home vent after being serviced by home care provider. Trach care performed. Patient doesn't require suctioning as he does well coughing out secretions. No distress noted on home equipment

## 2022-03-12 NOTE — Progress Notes (Signed)
Physical Therapy Treatment Patient Details Name: Gary [name] MRN: [id number] DOB: 13-Sep-1955 Today's Date: 03/12/2022   History of Present Illness Pt is a 66 y.o. 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  Prolonged admittances earlier this year for similar. MD assessment includes: Acute on chronic diastolic CHF, AKI, and acute on chronic respiratory failure with hypoxia.    PT Comments    Patient agreeable to PT with encouragement. He reports not feeling well after eating a cheeseburger for lunch recently. Attempted to get a standing weight unsuccessfully (machine did not read while standing). Patient stood x 2 bouts with cues for anterior weight shifting. Facilitation required for anterior weight shifting to achieve upright standing position with standing tolerance of 15-20 seconds. Unable to ambulate at this time. Recommend PT follow up to maximize independence and facilitate return to prior level of function.    Recommendations for follow up therapy are one component of a multi-disciplinary discharge planning process, led by the attending physician.  Recommendations may be updated based on patient status, additional functional criteria and insurance authorization.  Follow Up Recommendations  Skilled nursing-short term rehab (<3 hours/day) Can patient physically be transported by private vehicle: No   Assistance Recommended at Discharge Frequent or constant Supervision/Assistance  Patient can return home with the following Two people to help with walking and/or transfers;Two people to help with bathing/dressing/bathroom;Assistance with cooking/housework;Assist for transportation;Help with stairs or ramp for entrance   Equipment Recommendations  None recommended by PT    Recommendations for Other Services       Precautions / Restrictions Precautions Precautions:  Fall Restrictions Weight Bearing Restrictions: Yes RLE Weight Bearing: Weight bearing as tolerated     Mobility  Bed Mobility Overal bed mobility: Needs Assistance Bed Mobility: Supine to Sit, Sit to Supine Rolling: Min assist   Supine to sit: Min assist Sit to supine: Max assist   General bed mobility comments: rolling assistance provided with rolling to the left. cues for technique. assistance required today for trunk support to sit upright. assistance for BLE support to return to bed. increased time and effort required to complete tasks    Transfers Overall transfer level: Needs assistance Equipment used: Rolling walker (2 wheels) Transfers: Sit to/from Stand Sit to Stand: Min assist, +2 physical assistance, From elevated surface, Mod assist           General transfer comment: attempted to get a standing weight, however machine did not read while patient standing. increased time required with increased difficulty compared to usual due to more narrow base of support (on the scale). patient stood x 2 bouts with verbal cues for anterior weight shifting    Ambulation/Gait               General Gait Details: unable to at this time   Stairs             Wheelchair Mobility    Modified Rankin (Stroke Patients Only)       Balance Overall balance assessment: Needs assistance Sitting-balance support: Feet supported Sitting balance-[name] Scale: Good Sitting balance - Comments: supervision for safety   Standing balance support: Bilateral upper extremity supported, Reliant on assistive device for balance Standing balance-[name] Scale: Poor Standing balance comment: facilitation for anterior weight shifting. external support required to maintain standing balance. standing tolerance of around 15-20 seconds  Cognition Arousal/Alertness: Awake/alert Behavior During Therapy: WFL for tasks assessed/performed Overall Cognitive  Status: Within Functional Limits for tasks assessed                                 General Comments: patient is able to follow single step commands consistently        Exercises      General Comments General comments (skin integrity, edema, etc.): rest breaks required between bouts of activity due to fatigue. heart rate in the 80's with activity      Pertinent Vitals/Pain Pain Assessment Pain Assessment: No/denies pain    Home Living                          Prior Function            PT Goals (current goals can now be found in the care plan section) Acute Rehab PT Goals Patient Stated Goal: to be able to walk PT Goal Formulation: With patient Time For Goal Achievement: 03/23/22 Potential to Achieve Goals: Fair Progress towards PT goals: Progressing toward goals    Frequency    Min 2X/week      PT Plan Current plan remains appropriate    Co-evaluation PT/OT/SLP Co-Evaluation/Treatment: Yes Reason for Co-Treatment: Complexity of the patient's impairments (multi-system involvement);To address functional/ADL transfers PT goals addressed during session: Mobility/safety with mobility;Strengthening/ROM        AM-PAC PT "6 Clicks" Mobility   Outcome Measure  Help needed turning from your back to your side while in a flat bed without using bedrails?: A Lot Help needed moving from lying on your back to sitting on the side of a flat bed without using bedrails?: A Lot Help needed moving to and from a bed to a chair (including a wheelchair)?: A Lot Help needed standing up from a chair using your arms (e.g., wheelchair or bedside chair)?: A Lot Help needed to walk in hospital room?: Total Help needed climbing 3-5 steps with a railing? : Total 6 Click Score: 10    End of Session Equipment Utilized During Treatment: Oxygen (trach mask oxygen; PMSV) Activity Tolerance: Patient tolerated treatment well;Patient limited by fatigue Patient left: in  bed;with call bell/phone within reach Nurse Communication: Mobility status PT Visit Diagnosis: Unsteadiness on feet (R26.81);Muscle weakness (generalized) (M62.81);Difficulty in walking, not elsewhere classified (R26.2)     Time: 8144-8185 PT Time Calculation (min) (ACUTE ONLY): 46 min  Charges:  $Therapeutic Activity: 23-37 mins                     [name], PT, MPT    [name] 03/12/2022, 2:38 PM

## 2022-03-13 DIAGNOSIS — I5033 Acute on chronic diastolic (congestive) heart failure: Secondary | ICD-10-CM | POA: Diagnosis not present

## 2022-03-13 NOTE — Progress Notes (Signed)
PROGRESS NOTE    Gary [name]  WUJ:[id number] DOB: [birth year] DOA: 01/23/2022 PCP: [facility] [place]   Brief Narrative:  This 66 years old male with medical history significant for diastolic CHF, chronic respiratory failure status post tracheostomy, COPD, essential hypertension, PUD and sleep apnea who presented to the emergency room with acute onset of worsening dyspnea.  Apparently patient has been in the ER for the past 10 days as he was unable to be cared at home specially requirement of tracheostomy care, awaiting placement. He developed worsening pulmonary edema later being admitted for management of acute on chronic diastolic heart failure and given IV Lasix.  Patient condition has gradually improved.  He was able to tolerate nocturnal trilogy ventilation.  Currently pending nursing placement.  Assessment & Plan:   Principal Problem:   Acute on chronic diastolic (congestive) heart failure ([name]) Active Problems:   Acute on chronic respiratory failure with hypoxia ([name])   AKI (acute kidney injury) ([name])   Chronic obstructive pulmonary disease (COPD) ([name])   Obesity, Class III, BMI 40-49.9 (morbid obesity) ([place])   GERD without esophagitis   Anxiety and depression   Dyslipidemia   Hypokalemia  Acute on chronic diastolic heart failure: Acute on chronic hypoxic and hypercapnic respiratory failure secondary to advanced COPD Tracheostomy tube dependence: Condition had improved. Currently on oral torsemide.Trach exchange performed on 11/14. Patient doing well, condition more stabilized.  Currently pending nursing home placement. However, patient appears to be progressing well with physical therapy.   Acute kidney injury: > Resolved Could be due to diuretic use. Renal functions are now back to baseline. AKI resolved.   GERD without esophagitis: Continue pantoprazole 40 mg daily.   Obesity, Class III, BMI 40-49.9 (morbid obesity) ([place]) Estimated body mass index is  48.12 kg/m as calculated from the following:   Height as of this encounter: '6\' 3"'$  (1.905 m).   Weight as of this encounter: 174.6 kg.    Anxiety and depression Continue Lexapro.   Dyslipidemia: Continue statin therapy..   Microcytic normochromic anemia Iron deficiency anemia. Continue iron supplementation. s/p IV iron x3 doses   Right knee pain secondary to lateral and medial meniscal tear. MRI knee confirms.--ortho consulted, most likely require a total knee replacement to address both the meniscal pathology and the degenerative changes, however, pt currently is not a surgical candidate. --s/p Kenalog injection on 11/10 with improved symptoms.   Hypokalemia: Replaced and resolved.   Awaiting for nursing home placement.   DVT prophylaxis: Eliquis Code Status: Full code Family Communication: No family at bedside Disposition Plan:   Status is: Inpatient Remains inpatient appropriate because: Admitted for acute on chronic hypoxic and hypercapnic respiratory secondary to acute on chronic diastolic CHF exacerbation requiring diuresis.  Patient is now medically clear awaiting SNF placement.   Consultants:  PCCM  Procedures: None Antimicrobials:  Anti-infectives (From admission, onward)    None       Subjective: Patient was seen and examined at bedside.  Overnight events noted. Patient reports doing much better.  He has used his trilogy machine overnight. He denies any other complaints.  He is awaiting SNF placement.  Objective: Vitals:   03/12/22 1300 03/12/22 1600 03/12/22 2100 03/13/22 1400  BP: 122/71 120/80 120/77   Pulse:      Resp: '18 18 15 18  '$ Temp: 97.8 F (36.6 C) 98.2 F (36.8 C) 98 F (36.7 C)   TempSrc: Oral Oral Oral   SpO2: 92% 94% 92%   Weight:  Height:        Intake/Output Summary (Last 24 hours) at 03/13/2022 1504 Last data filed at 03/13/2022 0000 Gross per 24 hour  Intake --  Output 1800 ml  Net -1800 ml   Filed Weights    03/03/22 0500 03/10/22 0500 03/11/22 0500  Weight: (!) 186.4 kg (!) 186.4 kg (!) 185.9 kg    Examination:  General exam: Morbidly obese, appears comfortable, not in any acute distress.  Tracheostomy+ Respiratory system: CTA bilaterally, respiratory effort normal, RR 11 Cardiovascular system: S1-S2 heard, regular rate and rhythm, no murmur. Gastrointestinal system: Abdomen is soft, non tender, non distended, BS+ Central nervous system: Alert and oriented x 3, no focal neurological deficits. Extremities: No edema, no cyanosis, no clubbing Skin: No rashes, lesions or ulcers Psychiatry: Judgement and insight appear normal. Mood & affect appropriate.     Data Reviewed: I have personally reviewed following labs and imaging studies  CBC: Recent Labs  Lab 03/09/22 0311  WBC 8.6  HGB 10.0*  HCT 36.8*  MCV 78.0*  PLT 294   Basic Metabolic Panel: No results for input(s): "NA", "K", "CL", "CO2", "GLUCOSE", "BUN", "CREATININE", "CALCIUM", "MG", "PHOS" in the last 168 hours.  GFR: Estimated Creatinine Clearance: 106.5 mL/min (by C-G formula based on SCr of 1.24 mg/dL). Liver Function Tests: No results for input(s): "AST", "ALT", "ALKPHOS", "BILITOT", "PROT", "ALBUMIN" in the last 168 hours. No results for input(s): "LIPASE", "AMYLASE" in the last 168 hours. No results for input(s): "AMMONIA" in the last 168 hours. Coagulation Profile: No results for input(s): "INR", "PROTIME" in the last 168 hours. Cardiac Enzymes: No results for input(s): "CKTOTAL", "CKMB", "CKMBINDEX", "TROPONINI" in the last 168 hours. BNP (last 3 results) No results for input(s): "PROBNP" in the last 8760 hours. HbA1C: No results for input(s): "HGBA1C" in the last 72 hours. CBG: No results for input(s): "GLUCAP" in the last 168 hours. Lipid Profile: No results for input(s): "CHOL", "HDL", "LDLCALC", "TRIG", "CHOLHDL", "LDLDIRECT" in the last 72 hours. Thyroid Function Tests: No results for input(s): "TSH",  "T4TOTAL", "FREET4", "T3FREE", "THYROIDAB" in the last 72 hours. Anemia Panel: No results for input(s): "VITAMINB12", "FOLATE", "FERRITIN", "TIBC", "IRON", "RETICCTPCT" in the last 72 hours. Sepsis Labs: No results for input(s): "PROCALCITON", "LATICACIDVEN" in the last 168 hours.  No results found for this or any previous visit (from the past 240 hour(s)).   Radiology Studies: No results found.  Scheduled Meds:  ammonium lactate   Topical BID   apixaban  2.5 mg Oral BID   Chlorhexidine Gluconate Cloth  6 each Topical Q0600   vitamin B-12  500 mcg Oral Daily   diclofenac Sodium  2 g Topical TID   escitalopram  5 mg Oral Daily   ferrous sulfate  325 mg Oral BID WC   gabapentin  200 mg Oral TID   guaiFENesin  600 mg Oral BID   Ipratropium-Albuterol  2 puff Inhalation BID   melatonin  5 mg Oral QHS   mouth rinse  15 mL Mouth Rinse 4 times per day   pantoprazole  40 mg Oral Daily   potassium chloride  10 mEq Oral Daily   simvastatin  10 mg Oral q1800   torsemide  20 mg Oral Daily   Continuous Infusions:  sodium chloride Stopped (02/26/22 1159)     LOS: 38 days    Time spent: 35 mins    Frisco Cordts, MD [facility]   If 7PM-7AM, please contact night-coverage

## 2022-03-13 NOTE — Progress Notes (Signed)
Physical Therapy Treatment Patient Details Name: Gary [name] MRN: [id number] DOB: 03-18-1956 Today's Date: 03/13/2022   History of Present Illness Pt is a 66 y.o. 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  Prolonged admittances earlier this year for similar. MD assessment includes: Acute on chronic diastolic CHF, AKI, and acute on chronic respiratory failure with hypoxia.    PT Comments    Patient agreeable to PT. He was able to stand x 1 bout for 1 minute duration. He attempted standing several more times unsuccessfully despite maximal cues for technique and anterior weight shifting. Activity tolerance limited by fatigue. Recommend to continue PT to maximize independence and decrease caregiver burden. SNF recommended at discharge.    Recommendations for follow up therapy are one component of a multi-disciplinary discharge planning process, led by the attending physician.  Recommendations may be updated based on patient status, additional functional criteria and insurance authorization.  Follow Up Recommendations  Skilled nursing-short term rehab (<3 hours/day) Can patient physically be transported by private vehicle: No   Assistance Recommended at Discharge Frequent or constant Supervision/Assistance  Patient can return home with the following Two people to help with walking and/or transfers;Two people to help with bathing/dressing/bathroom;Assistance with cooking/housework;Assist for transportation;Help with stairs or ramp for entrance   Equipment Recommendations  None recommended by PT    Recommendations for Other Services       Precautions / Restrictions Precautions Precautions: Fall Restrictions Weight Bearing Restrictions: No     Mobility  Bed Mobility Overal bed mobility: Needs Assistance Bed Mobility: Supine to Sit, Sit to Supine     Supine to sit:  Min assist Sit to supine: Max assist   General bed mobility comments: assistance for BLE support. verbal cues for technique and sequencing    Transfers Overall transfer level: Needs assistance Equipment used: Rolling walker (2 wheels) Transfers: Sit to/from Stand Sit to Stand: Min assist, Mod assist, +2 physical assistance           General transfer comment: patient stood with the first attempt. patient unable to stand again despite attempting multiple times due to fatigue. maximal cues for anterior weight shifting to facilitate lift off from bed. occasionally with standing attemps, patient feels as thougg hips are slipping to far forward. maximal assistance required to shift hips posteriorly with cues for technique    Ambulation/Gait                   Stairs             Wheelchair Mobility    Modified Rankin (Stroke Patients Only)       Balance Overall balance assessment: Needs assistance Sitting-balance support: Feet supported Sitting balance-[name] Scale: Good     Standing balance support: Bilateral upper extremity supported Standing balance-[name] Scale: Fair Standing balance comment: facilitation for anterior weight shifting. external support required to maintain standing balance. standing tolerance of 1 minute                            Cognition Arousal/Alertness: Awake/alert Behavior During Therapy: WFL for tasks assessed/performed Overall Cognitive Status: Within Functional Limits for tasks assessed                                 General Comments: still  with poor insight. needs encouragement occasionally to progress activity further. seems discouraged at times when he is unable to stand as easily as before        Exercises      General Comments        Pertinent Vitals/Pain Pain Assessment Pain Assessment: No/denies pain    Home Living                          Prior Function            PT  Goals (current goals can now be found in the care plan section) Acute Rehab PT Goals Patient Stated Goal: to be able to walk PT Goal Formulation: With patient Time For Goal Achievement: 03/23/22 Potential to Achieve Goals: Fair Progress towards PT goals: Progressing toward goals    Frequency    Min 2X/week      PT Plan Current plan remains appropriate    Co-evaluation PT/OT/SLP Co-Evaluation/Treatment: Yes Reason for Co-Treatment: Complexity of the patient's impairments (multi-system involvement);To address functional/ADL transfers (partial co-treatment to maximize functional outcomes) PT goals addressed during session: Mobility/safety with mobility        AM-PAC PT "6 Clicks" Mobility   Outcome Measure  Help needed turning from your back to your side while in a flat bed without using bedrails?: A Lot Help needed moving from lying on your back to sitting on the side of a flat bed without using bedrails?: A Lot Help needed moving to and from a bed to a chair (including a wheelchair)?: A Lot Help needed standing up from a chair using your arms (e.g., wheelchair or bedside chair)?: A Lot Help needed to walk in hospital room?: Total Help needed climbing 3-5 steps with a railing? : Total 6 Click Score: 10    End of Session Equipment Utilized During Treatment: Oxygen (trach collar with PMSV) Activity Tolerance: Patient tolerated treatment well Patient left: in bed;with call bell/phone within reach;with bed alarm set Nurse Communication: Mobility status PT Visit Diagnosis: Unsteadiness on feet (R26.81);Muscle weakness (generalized) (M62.81);Difficulty in walking, not elsewhere classified (R26.2)     Time: 3614-4315 PT Time Calculation (min) (ACUTE ONLY): 34 min  Charges:  $Therapeutic Activity: 23-37 mins                     Gary [name], PT, MPT    Gary [name] 03/13/2022, 2:37 PM

## 2022-03-13 NOTE — Progress Notes (Signed)
Occupational Therapy Treatment Patient Details Name: Gary [name] MRN: [id number] DOB: Aug 26, 1955 Today's Date: 03/13/2022   History of present illness Pt is a 66 y.o. 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  Prolonged admittances earlier this year for similar. MD assessment includes: Acute on chronic diastolic CHF, AKI, and acute on chronic respiratory failure with hypoxia.   OT comments  Mr Dirr was seen for OT/PT co-treatment on this date. Upon arrival to room pt seated EOB with PT, agreeable to tx. Pt requires MAX A don B shoes. requires MIN A x2 + RW for standing balance, unable to trial standing grooming. Pt making good progress toward goals, will continue to follow POC. Discharge recommendation remains appropriate.     Recommendations for follow up therapy are one component of a multi-disciplinary discharge planning process, led by the attending physician.  Recommendations may be updated based on patient status, additional functional criteria and insurance authorization.    Follow Up Recommendations  Skilled nursing-short term rehab (<3 hours/day)     Assistance Recommended at Discharge Frequent or constant Supervision/Assistance  Patient can return home with the following  Two people to help with walking and/or transfers;Two people to help with bathing/dressing/bathroom   Equipment Recommendations  Hospital bed    Recommendations for Other Services      Precautions / Restrictions Precautions Precautions: Fall Restrictions Weight Bearing Restrictions: No       Mobility Bed Mobility Overal bed mobility: Needs Assistance Bed Mobility: Supine to Sit, Sit to Supine     Supine to sit: Min assist Sit to supine: Max assist, +2 for safety/equipment   General bed mobility comments: assistance for BLE support. verbal cues for technique and sequencing     Transfers Overall transfer level: Needs assistance Equipment used: Rolling walker (2 wheels) Transfers: Sit to/from Stand Sit to Stand: Min assist, Mod assist, +2 physical assistance           General transfer comment: limited by fearful of falling     Balance Overall balance assessment: Needs assistance Sitting-balance support: Feet supported Sitting balance-[name] Scale: Good     Standing balance support: Bilateral upper extremity supported Standing balance-[name] Scale: Fair Standing balance comment: standing tolerance of 1 minute                           ADL either performed or assessed with clinical judgement   ADL Overall ADL's : Needs assistance/impaired                                       General ADL Comments: MAX A don B shoes. requires MIN A x2 + RW for standing balance, unable to trial standing grooming      Cognition Arousal/Alertness: Awake/alert Behavior During Therapy: WFL for tasks assessed/performed Overall Cognitive Status: Within Functional Limits for tasks assessed                                 General Comments: poor insight into proper technique and positioning for standing                   Pertinent Vitals/ Pain       Pain Assessment Pain  Assessment: No/denies pain   Frequency  Min 2X/week        Progress Toward Goals  OT Goals(current goals can now be found in the care plan section)  Progress towards OT goals: Progressing toward goals  Acute Rehab OT Goals Patient Stated Goal: to go home OT Goal Formulation: With patient Time For Goal Achievement: 03/20/22 Potential to Achieve Goals: Fair ADL Goals Pt Will Perform Grooming: with modified independence;sitting Pt Will Perform Lower Body Dressing: sitting/lateral leans;with min assist Pt Will Transfer to Toilet: with min assist;bedside commode;stand pivot transfer  Plan Discharge plan remains appropriate;Frequency remains  appropriate    Co-evaluation    PT/OT/SLP Co-Evaluation/Treatment: Yes Reason for Co-Treatment: Complexity of the patient's impairments (multi-system involvement) PT goals addressed during session: Mobility/safety with mobility        AM-PAC OT "6 Clicks" Daily Activity     Outcome Measure   Help from another person eating meals?: None Help from another person taking care of personal grooming?: A Little Help from another person toileting, which includes using toliet, bedpan, or urinal?: A Lot Help from another person bathing (including washing, rinsing, drying)?: A Lot Help from another person to put on and taking off regular upper body clothing?: A Little Help from another person to put on and taking off regular lower body clothing?: A Lot 6 Click Score: 16    End of Session Equipment Utilized During Treatment: Rolling walker (2 wheels)  OT Visit Diagnosis: Other abnormalities of gait and mobility (R26.89);Muscle weakness (generalized) (M62.81)   Activity Tolerance Patient tolerated treatment well   Patient Left in bed;with call bell/phone within reach   Nurse Communication Mobility status        Time: 8088-1103 OT Time Calculation (min): 24 min  Charges: OT General Charges $OT Visit: 1 Visit OT Treatments $Self Care/Home Management : 8-22 mins  [name], M.S. OTR/L  03/13/22, 3:04 PM  ascom [phone]

## 2022-03-14 DIAGNOSIS — I5033 Acute on chronic diastolic (congestive) heart failure: Secondary | ICD-10-CM | POA: Diagnosis not present

## 2022-03-14 NOTE — Progress Notes (Signed)
PROGRESS NOTE    Gary [name]  JJK:[id number] DOB: Oct 28, 1955 DOA: 01/23/2022 PCP: [facility]   Brief Narrative:  This 66 years old male with medical history significant for diastolic CHF, chronic respiratory failure status post tracheostomy, COPD, essential hypertension, PUD and sleep apnea who presented to the emergency room with acute onset of worsening dyspnea.  Apparently patient has been in the ER for the past 10 days as he was unable to be cared at home specially requirement of tracheostomy care, awaiting placement. He developed worsening pulmonary edema later being admitted for management of acute on chronic diastolic heart failure and given IV Lasix.  Patient condition has gradually improved.  He was able to tolerate nocturnal trilogy ventilation.  Currently pending nursing placement.  Assessment & Plan:   Principal Problem:   Acute on chronic diastolic (congestive) heart failure ([name]) Active Problems:   Acute on chronic respiratory failure with hypoxia ([name])   AKI (acute kidney injury) ([name])   Chronic obstructive pulmonary disease (COPD) ([name])   Obesity, Class III, BMI 40-49.9 (morbid obesity) ([place])   GERD without esophagitis   Anxiety and depression   Dyslipidemia   Hypokalemia  Acute on chronic diastolic heart failure: Acute on chronic hypoxic and hypercapnic respiratory failure secondary to advanced COPD Tracheostomy tube dependence: Condition had improved. Currently on oral torsemide.Trach exchange performed on 11/14. Patient doing well, condition more stabilized.  Currently pending nursing home placement. However, patient appears to be progressing well with physical therapy.   Acute kidney injury: > Resolved Could be due to diuretic use. Renal functions are now back to baseline. AKI resolved.   GERD without esophagitis: Continue pantoprazole 40 mg daily.   Obesity, Class III, BMI 40-49.9 (morbid obesity) ([name]) Estimated body mass index is  48.12 kg/m as calculated from the following:   Height as of this encounter: '6\' 3"'$  (1.905 m).   Weight as of this encounter: 174.6 kg.    Anxiety and depression Continue Lexapro.   Dyslipidemia: Continue statin therapy..   Microcytic normochromic anemia Iron deficiency anemia. Continue iron supplementation. s/p IV iron x3 doses   Right knee pain secondary to lateral and medial meniscal tear. MRI knee confirms.--ortho consulted, most likely require a total knee replacement to address both the meniscal pathology and the degenerative changes, however, pt currently is not a surgical candidate. --s/p Kenalog injection on 11/10 with improved symptoms.   Hypokalemia: Replaced and resolved.   Awaiting for nursing home placement.   DVT prophylaxis: Eliquis Code Status: Full code Family Communication: No family at bedside Disposition Plan:   Status is: Inpatient Remains inpatient appropriate because: Admitted for acute on chronic hypoxic and hypercapnic respiratory secondary to acute on chronic diastolic CHF exacerbation requiring diuresis.  Patient is now medically clear awaiting SNF placement.   Consultants:  PCCM  Procedures: None Antimicrobials:  Anti-infectives (From admission, onward)    None       Subjective: Patient was seen and examined at bedside.  Overnight events noted. Patient reports doing much better.  He denies any complaints. He is awaiting SNF placement.  Objective: Vitals:   03/13/22 2000 03/13/22 2330 03/14/22 1022 03/14/22 1320  BP: (!) 126/91  122/71 118/72  Pulse: 82     Resp: 15  (!) 21 (!) 25  Temp: 98.4 F (36.9 C)  99 F (37.2 C) 98.2 F (36.8 C)  TempSrc: Oral  Oral Oral  SpO2: 94% 98%  94%  Weight:      Height:  Intake/Output Summary (Last 24 hours) at 03/14/2022 1403 Last data filed at 03/14/2022 1300 Gross per 24 hour  Intake 720 ml  Output 2500 ml  Net -1780 ml   Filed Weights   03/03/22 0500 03/10/22 0500 03/11/22  0500  Weight: (!) 186.4 kg (!) 186.4 kg (!) 185.9 kg    Examination:  General exam: Morbidly obese, appears comfortable, not in any distress, tracheostomy+ Respiratory system: CTA bilaterally, respiratory effort normal, RR 13 Cardiovascular system: S1-S2 heard, regular rate and rhythm, no murmur. Gastrointestinal system:Abdomen is soft, non tender, non distended, BS+ Central nervous system: Alert and oriented x 3, no focal neurological deficits. Extremities: No edema, no cyanosis, no clubbing Skin: No rashes, lesions or ulcers Psychiatry: Judgement and insight appear normal. Mood & affect appropriate.     Data Reviewed: I have personally reviewed following labs and imaging studies  CBC: Recent Labs  Lab 03/09/22 0311  WBC 8.6  HGB 10.0*  HCT 36.8*  MCV 78.0*  PLT 301   Basic Metabolic Panel: No results for input(s): "NA", "K", "CL", "CO2", "GLUCOSE", "BUN", "CREATININE", "CALCIUM", "MG", "PHOS" in the last 168 hours.  GFR: Estimated Creatinine Clearance: 106.5 mL/min (by C-G formula based on SCr of 1.24 mg/dL). Liver Function Tests: No results for input(s): "AST", "ALT", "ALKPHOS", "BILITOT", "PROT", "ALBUMIN" in the last 168 hours. No results for input(s): "LIPASE", "AMYLASE" in the last 168 hours. No results for input(s): "AMMONIA" in the last 168 hours. Coagulation Profile: No results for input(s): "INR", "PROTIME" in the last 168 hours. Cardiac Enzymes: No results for input(s): "CKTOTAL", "CKMB", "CKMBINDEX", "TROPONINI" in the last 168 hours. BNP (last 3 results) No results for input(s): "PROBNP" in the last 8760 hours. HbA1C: No results for input(s): "HGBA1C" in the last 72 hours. CBG: No results for input(s): "GLUCAP" in the last 168 hours. Lipid Profile: No results for input(s): "CHOL", "HDL", "LDLCALC", "TRIG", "CHOLHDL", "LDLDIRECT" in the last 72 hours. Thyroid Function Tests: No results for input(s): "TSH", "T4TOTAL", "FREET4", "T3FREE", "THYROIDAB" in  the last 72 hours. Anemia Panel: No results for input(s): "VITAMINB12", "FOLATE", "FERRITIN", "TIBC", "IRON", "RETICCTPCT" in the last 72 hours. Sepsis Labs: No results for input(s): "PROCALCITON", "LATICACIDVEN" in the last 168 hours.  No results found for this or any previous visit (from the past 240 hour(s)).   Radiology Studies: No results found.  Scheduled Meds:  ammonium lactate   Topical BID   apixaban  2.5 mg Oral BID   Chlorhexidine Gluconate Cloth  6 each Topical Q0600   vitamin B-12  500 mcg Oral Daily   diclofenac Sodium  2 g Topical TID   escitalopram  5 mg Oral Daily   ferrous sulfate  325 mg Oral BID WC   gabapentin  200 mg Oral TID   guaiFENesin  600 mg Oral BID   Ipratropium-Albuterol  2 puff Inhalation BID   melatonin  5 mg Oral QHS   mouth rinse  15 mL Mouth Rinse 4 times per day   pantoprazole  40 mg Oral Daily   potassium chloride  10 mEq Oral Daily   simvastatin  10 mg Oral q1800   torsemide  20 mg Oral Daily   Continuous Infusions:  sodium chloride Stopped (02/26/22 1159)     LOS: 39 days    Time spent: 35 mins    Dreyton Roessner, MD [facility]   If 7PM-7AM, please contact night-coverage

## 2022-03-14 NOTE — Progress Notes (Signed)
PT Cancellation Note  Patient Details Name: TANYON ALIPIO MRN: [id number] DOB: 07-Dec-1955   Cancelled Treatment:    Reason Eval/Treat Not Completed: Other (comment) (Patient declined due to feeling weak and not feeling well today. He states he will be ready for therapy tomorrow. Encouraged bed exercises.)  [name], PT, MPT  [name] 03/14/2022, 1:58 PM

## 2022-03-14 NOTE — TOC Progression Note (Signed)
Transition of Care [facility]) - Progression Note    Patient Details  Name: Gary [name] MRN: [id number] Date of Birth: Mar 02, 1956  Transition of Care [facility]) CM/SW Contact  Shelbie Hutching, RN Phone Number: 03/14/2022, 1:37 PM  Clinical Narrative:    No placement options at this time.  Never received a call back from [facility] or [facility].     Expected Discharge Plan: [place] Barriers to Discharge: Continued Medical Work up, Ship broker, Unsafe home situation  Expected Discharge Plan and Services Expected Discharge Plan: [place] In-house Referral: Clinical Social Work     Living arrangements for the past 2 months: Single Family Home                                       Social Determinants of Health (SDOH) Interventions    Readmission Risk Interventions    12/31/2021    8:58 AM 12/20/2021    4:21 PM 05/04/2021    1:26 PM  Readmission Risk Prevention Plan  Transportation Screening Complete Complete Complete  Medication Review Press photographer) Complete Complete Complete  PCP or Specialist appointment within 3-5 days of discharge Complete Complete Complete  HRI or Home Care Consult Complete Complete Complete  SW Recovery Care/Counseling Consult Complete Complete Complete  Palliative Care Screening Complete Complete Not [place] Not Applicable Not Applicable Not Applicable

## 2022-03-14 NOTE — Plan of Care (Signed)
Pt progressing towards goal, VSS, pt denies any any need all day. Eating and drinking well, adequate urine output. Care plan reviewed with  the patient.

## 2022-03-15 DIAGNOSIS — J9621 Acute and chronic respiratory failure with hypoxia: Secondary | ICD-10-CM | POA: Diagnosis not present

## 2022-03-15 DIAGNOSIS — I5033 Acute on chronic diastolic (congestive) heart failure: Secondary | ICD-10-CM | POA: Diagnosis not present

## 2022-03-15 DIAGNOSIS — J449 Chronic obstructive pulmonary disease, unspecified: Secondary | ICD-10-CM | POA: Diagnosis not present

## 2022-03-15 NOTE — Progress Notes (Signed)
Physical Therapy Treatment Patient Details Name: Gary [name] MRN: [id number] DOB: [birth year] Today's Date: 03/15/2022   History of Present Illness Pt is a 66 y.o. 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  Prolonged admittances earlier this year for similar. MD assessment includes: Acute on chronic diastolic CHF, AKI, and acute on chronic respiratory failure with hypoxia.    PT Comments    Patient is agreeable to PT session. Focus on anterior weight shifting with sitting in preparation for standing attempts. 2 bouts of standing performed with standing tolerance of 70 seconds and 45 seconds. Unable to take steps today. Activity tolerance limited by fatigue in standing. Recommend to continue PT to maximize independence. SNF recommended at discharge.    Recommendations for follow up therapy are one component of a multi-disciplinary discharge planning process, led by the attending physician.  Recommendations may be updated based on patient status, additional functional criteria and insurance authorization.  Follow Up Recommendations  Skilled nursing-short term rehab (<3 hours/day) Can patient physically be transported by private vehicle: No   Assistance Recommended at Discharge Frequent or constant Supervision/Assistance  Patient can return home with the following Two people to help with walking and/or transfers;Two people to help with bathing/dressing/bathroom;Assistance with cooking/housework;Assist for transportation;Help with stairs or ramp for entrance   Equipment Recommendations  None recommended by PT    Recommendations for Other Services       Precautions / Restrictions Precautions Precautions: Fall Restrictions Weight Bearing Restrictions: No     Mobility  Bed Mobility Overal bed mobility: Needs Assistance Bed Mobility: Supine to Sit, Sit to Supine      Supine to sit: Supervision Sit to supine: Max assist   General bed mobility comments: assistance required for BLE support. increased time and effort required with bed mobility tasks with short rest breaks required    Transfers Overall transfer level: Needs assistance Equipment used: Rolling walker (2 wheels) Transfers: Sit to/from Stand Sit to Stand: Min assist, Mod assist, +2 physical assistance           General transfer comment: verbal cues for anterior weight shifting to stand with hip extension faciliation provided    Ambulation/Gait             Pre-gait activities: cues for anterior weight shifting, to increase standing tolerance. unable to take a step or march in place     Stairs             Wheelchair Mobility    Modified Rankin (Stroke Patients Only)       Balance Overall balance assessment: Needs assistance Sitting-balance support: Feet supported Sitting balance-[name] Scale: Good Sitting balance - Comments: supervision for safety   Standing balance support: Bilateral upper extremity supported Standing balance-[name] Scale: Fair Standing balance comment: standing tolerance of 45 seconds, 70 seconds                            Cognition Arousal/Alertness: Awake/alert Behavior During Therapy: WFL for tasks assessed/performed Overall Cognitive Status: Within Functional Limits for tasks assessed Area of Impairment: Safety/judgement, Problem solving                       Following Commands: Follows one step commands consistently, Follows multi-step commands inconsistently Safety/Judgement: Decreased awareness of safety, Decreased awareness of deficits  Exercises      General Comments        Pertinent Vitals/Pain Pain Assessment Pain Assessment: Faces Faces Pain Scale: Hurts a little bit Pain Location: L wrist Pain Descriptors / Indicators: Aching, Sore Pain Intervention(s): Limited activity within  patient's tolerance, Monitored during session    Home Living                          Prior Function            PT Goals (current goals can now be found in the care plan section) Acute Rehab PT Goals Patient Stated Goal: to walk and go home PT Goal Formulation: With patient Time For Goal Achievement: 03/23/22 Potential to Achieve Goals: Fair Progress towards PT goals: Progressing toward goals    Frequency    Min 2X/week      PT Plan Current plan remains appropriate    Co-evaluation PT/OT/SLP Co-Evaluation/Treatment: Yes Reason for Co-Treatment: To address functional/ADL transfers PT goals addressed during session: Mobility/safety with mobility OT goals addressed during session: ADL's and self-care      AM-PAC PT "6 Clicks" Mobility   Outcome Measure  Help needed turning from your back to your side while in a flat bed without using bedrails?: A Lot Help needed moving from lying on your back to sitting on the side of a flat bed without using bedrails?: A Lot Help needed moving to and from a bed to a chair (including a wheelchair)?: A Lot Help needed standing up from a chair using your arms (e.g., wheelchair or bedside chair)?: A Lot Help needed to walk in hospital room?: Total Help needed climbing 3-5 steps with a railing? : Total 6 Click Score: 10    End of Session Equipment Utilized During Treatment: Oxygen (trach collar with PMSV)       PT Visit Diagnosis: Unsteadiness on feet (R26.81);Muscle weakness (generalized) (M62.81);Difficulty in walking, not elsewhere classified (R26.2)     Time: 9038-3338 PT Time Calculation (min) (ACUTE ONLY): 38 min  Charges:  $Therapeutic Activity: 23-37 mins                     [name], PT, MPT    [name] 03/15/2022, 3:41 PM

## 2022-03-15 NOTE — TOC Progression Note (Signed)
Transition of Care [facility]) - Progression Note    Patient Details  Name: Gary [name] MRN: [id number] Date of Birth: Mar 08, 1956  Transition of Care [facility]) CM/SW Contact  Shelbie Hutching, RN Phone Number: 03/15/2022, 9:12 AM  Clinical Narrative:    [facility] is now accepting established trach patient's.  Referral sent over to [facility]  Unsure if they would be able to accept the patient with the trilogy at night.    Expected Discharge Plan: [place] Barriers to Discharge: Continued Medical Work up, Ship broker, Unsafe home situation  Expected Discharge Plan and Services Expected Discharge Plan: [place] In-house Referral: Clinical Social Work     Living arrangements for the past 2 months: Single Family Home                                       Social Determinants of Health (SDOH) Interventions    Readmission Risk Interventions    12/31/2021    8:58 AM 12/20/2021    4:21 PM 05/04/2021    1:26 PM  Readmission Risk Prevention Plan  Transportation Screening Complete Complete Complete  Medication Review Press photographer) Complete Complete Complete  PCP or Specialist appointment within 3-5 days of discharge Complete Complete Complete  HRI or Home Care Consult Complete Complete Complete  SW Recovery Care/Counseling Consult Complete Complete Complete  Palliative Care Screening Complete Complete Not [place] Not Applicable Not Applicable Not Applicable

## 2022-03-15 NOTE — Progress Notes (Signed)
Progress Note   Patient: Gary [name] TOI:[id number] DOB: November 06, 1955 DOA: 01/23/2022     40 DOS: the patient was seen and examined on 03/15/2022   Brief hospital course:  Gary [name] is a 66 y.o. male with medical history significant for diastolic CHF, chronic respiratory failure status post tracheostomy, COPD, essential hypertension, PUD and sleep apnea who presented to the emergency room with acute onset of worsening dyspnea.  Apparently patient has been in the ER for the past 10 days as he was unable to be cared at home specially requirement of tracheostomy care, awaiting placement. He developed worsening pulmonary edema. Being admitted for management of acute on chronic diastolic heart failure and given IV Lasix.  Patient condition has gradually improved.  He was able to tolerate nocturnal trilogy ventilation.  Currently pending nursing placement.  Assessment and Plan: Acute on chronic diastolic heart failure: Acute on chronic hypoxic and hypercapnic respiratory failure secondary to advanced COPD Tracheostomy tube dependence: Condition had improved. Currently on oral torsemide.Trach exchange performed on 11/14. Patient doing well, condition more stabilized.  Currently pending nursing home placement. However, patient appears to be progressing well with physical therapy.   Acute kidney injury: > Resolved Could be due to diuretic use. Renal functions are now back to baseline. AKI resolved.   GERD without esophagitis: Continue pantoprazole 40 mg daily.   Obesity, Class III, BMI 40-49.9 (morbid obesity) ([place]) Estimated body mass index is 48.12 kg/m as calculated from the following:   Height as of this encounter: '6\' 3"'$  (1.905 m).   Weight as of this encounter: 174.6 kg.    Anxiety and depression Continue Lexapro.   Dyslipidemia: Continue statin therapy..   Microcytic normochromic anemia Iron deficiency anemia. Continue iron supplementation. s/p IV iron x3 doses   Right  knee pain secondary to lateral and medial meniscal tear. MRI knee confirms.--ortho consulted, most likely require a total knee replacement to address both the meniscal pathology and the degenerative changes, however, pt currently is not a surgical candidate. --s/p Kenalog injection on 11/10 with improved symptoms.   Hypokalemia: Replaced and resolved.  Examined again, condition stable, no new issues.  Currently pending nursing home placement.     Subjective:  Patient was able to stand up with the physical therapist, short of breath has improved.  Physical Exam: Vitals:   03/15/22 0300 03/15/22 0400 03/15/22 0619 03/15/22 0900  BP:   115/75 118/80  Pulse:    78  Resp: (!) 25 (!) 21 (!) 22 18  Temp:    98.4 F (36.9 C)  TempSrc:    Oral  SpO2:   96% 95%  Weight:      Height:       General exam: Appears calm and comfortable, morbid obesity. Respiratory system: Clear to auscultation. Respiratory effort normal. Cardiovascular system: S1 & S2 heard, RRR. No JVD, murmurs, rubs, gallops or clicks. No pedal edema. Gastrointestinal system: Abdomen is nondistended, soft and nontender. No organomegaly or masses felt. Normal bowel sounds heard. Central nervous system: Alert and oriented. No focal neurological deficits. Extremities: Symmetric 5 x 5 power. Skin: No rashes, lesions or ulcers Psychiatry: Judgement and insight appear normal. Mood & affect appropriate.   Data Reviewed:  There are no new results to review at this time.  Family Communication: None  Disposition: Status is: Inpatient Remains inpatient appropriate because: Unsafe discharge plan.  Planned Discharge Destination: Skilled nursing facility    Time spent: 15 minutes  Author: Sharen Hones, MD 03/15/2022 3:58 PM  For on call review [email or web address].

## 2022-03-15 NOTE — Progress Notes (Signed)
Occupational Therapy Treatment Patient Details Name: Gary [name] MRN: [id number] DOB: January 13, 1956 Today's Date: 03/15/2022   History of present illness Pt is a 66 y.o. 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  Prolonged admittances earlier this year for similar. MD assessment includes: Acute on chronic diastolic CHF, AKI, and acute on chronic respiratory failure with hypoxia.   OT comments  Mr Sangiovanni was seen for OT/PT co-treatment on this date. Upon arrival to room pt reclined in bed, agreeable to tx. Pt requires SUP to exit bed. MIN A x2 + RW for sit<>stand at EOB x2, tolerates 70" and 45" for 2 trials. Pt making progress toward goals, will continue to follow POC. Discharge recommendation remains appropriate.     Recommendations for follow up therapy are one component of a multi-disciplinary discharge planning process, led by the attending physician.  Recommendations may be updated based on patient status, additional functional criteria and insurance authorization.    Follow Up Recommendations  Skilled nursing-short term rehab (<3 hours/day)     Assistance Recommended at Discharge Frequent or constant Supervision/Assistance  Patient can return home with the following  Two people to help with walking and/or transfers;Two people to help with bathing/dressing/bathroom   Equipment Recommendations  Hospital bed    Recommendations for Other Services      Precautions / Restrictions Precautions Precautions: Fall Restrictions Weight Bearing Restrictions: No       Mobility Bed Mobility Overal bed mobility: Needs Assistance Bed Mobility: Supine to Sit, Sit to Supine     Supine to sit: Supervision Sit to supine: Max assist   General bed mobility comments: assistance required for BLE support. increased time and effort required with bed mobility tasks with short  rest breaks required    Transfers Overall transfer level: Needs assistance Equipment used: Rolling walker (2 wheels) Transfers: Sit to/from Stand Sit to Stand: Min assist, +2 physical assistance           General transfer comment: assist for anterior weight shifting     Balance Overall balance assessment: Needs assistance Sitting-balance support: Feet supported Sitting balance-[name] Scale: Good Sitting balance - Comments: supervision for safety   Standing balance support: Bilateral upper extremity supported Standing balance-[name] Scale: Fair Standing balance comment: standing tolerance of 45 seconds, 70 seconds                           ADL either performed or assessed with clinical judgement   ADL Overall ADL's : Needs assistance/impaired                                       General ADL Comments: MAX A don B shoes in sitting      Cognition Arousal/Alertness: Awake/alert Behavior During Therapy: WFL for tasks assessed/performed Overall Cognitive Status: Within Functional Limits for tasks assessed Area of Impairment: Safety/judgement, Problem solving                       Following Commands: Follows one step commands consistently, Follows multi-step commands inconsistently Safety/Judgement: Decreased awareness of safety, Decreased awareness of deficits               Pertinent Vitals/ Pain       Pain Assessment Pain Assessment:  Faces Faces Pain Scale: Hurts a little bit Pain Location: L wrist Pain Descriptors / Indicators: Aching, Sore Pain Intervention(s): Limited activity within patient's tolerance, Repositioned         Frequency  Min 2X/week        Progress Toward Goals  OT Goals(current goals can now be found in the care plan section)  Progress towards OT goals: Progressing toward goals  Acute Rehab OT Goals Patient Stated Goal: to go home OT Goal Formulation: With patient Time For Goal Achievement:  03/20/22 Potential to Achieve Goals: Fair ADL Goals Pt Will Perform Grooming: with modified independence;sitting Pt Will Perform Lower Body Dressing: sitting/lateral leans;with min assist Pt Will Transfer to Toilet: with min assist;bedside commode;stand pivot transfer  Plan Discharge plan remains appropriate;Frequency remains appropriate    Co-evaluation    PT/OT/SLP Co-Evaluation/Treatment: Yes Reason for Co-Treatment: To address functional/ADL transfers PT goals addressed during session: Mobility/safety with mobility OT goals addressed during session: ADL's and self-care      AM-PAC OT "6 Clicks" Daily Activity     Outcome Measure   Help from another person eating meals?: None Help from another person taking care of personal grooming?: A Little Help from another person toileting, which includes using toliet, bedpan, or urinal?: A Lot Help from another person bathing (including washing, rinsing, drying)?: A Lot Help from another person to put on and taking off regular upper body clothing?: A Little Help from another person to put on and taking off regular lower body clothing?: A Lot 6 Click Score: 16    End of Session    OT Visit Diagnosis: Other abnormalities of gait and mobility (R26.89);Muscle weakness (generalized) (M62.81)   Activity Tolerance Patient tolerated treatment well   Patient Left in bed;with call bell/phone within reach   Nurse Communication          Time: 2703-5009 OT Time Calculation (min): 32 min  Charges: OT General Charges $OT Visit: 1 Visit OT Treatments $Therapeutic Activity: 8-22 mins  [name], M.S. OTR/L  03/15/22, 3:53 PM  ascom [phone]

## 2022-03-16 DIAGNOSIS — J9621 Acute and chronic respiratory failure with hypoxia: Secondary | ICD-10-CM | POA: Diagnosis not present

## 2022-03-16 DIAGNOSIS — J449 Chronic obstructive pulmonary disease, unspecified: Secondary | ICD-10-CM | POA: Diagnosis not present

## 2022-03-16 DIAGNOSIS — I5033 Acute on chronic diastolic (congestive) heart failure: Secondary | ICD-10-CM | POA: Diagnosis not present

## 2022-03-16 NOTE — Progress Notes (Signed)
At approximately 1330 I was informed that patient had called out for nurse to be cleaned, at the time this information was given to me I was in another patient's room and informant was to see if anyone was available to assist this patient.  Informant was unable to find anyone, as updated to me, I asked the informant to let the patient know I would be there as soon as I was finished with the patient I was currently working with.  Upon entering this patients room the charge nurse was present and had begun assisting in cleaning the patient, the patient became vile and verbally abusive to this nurse in an accusatory and defamatory fashion repeating multiple times, "you're are a sorry, lazy ass nurse, who makes this assignment, you do nothing but sit all day, that's all you do, I have been watching you just sit and do nothing." Patient repeats defamatory statements to all who come in the room.  Patient provided care needed.  Continues to refused multiple many other care needs.

## 2022-03-16 NOTE — Progress Notes (Signed)
Physical Therapy Treatment Patient Details Name: Gary [name] MRN: [id number] DOB: May 31, 1955 Today's Date: 03/16/2022   History of Present Illness Pt is a 66 y.o. 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  Prolonged admittances earlier this year for similar. MD assessment includes: Acute on chronic diastolic CHF, AKI, and acute on chronic respiratory failure with hypoxia.    PT Comments    PT/OT co-treat 2/2 to needing +2 for mobility/safety. Pt was able to stand several times EOB but overall continues to be somewhat self limiting due to his fear of falling. C/o L hand pain however hand did not limit session progression. Stood 3 x with longest standing time 1 min 15 sec. PT will schedule session in future, in the OP gym, to attempt standing in // bars from adjustable height recliner. Pt will need rehab at DC to maximize independence with all ADLs.    Recommendations for follow up therapy are one component of a multi-disciplinary discharge planning process, led by the attending physician.  Recommendations may be updated based on patient status, additional functional criteria and insurance authorization.  Follow Up Recommendations  Skilled nursing-short term rehab (<3 hours/day)     Assistance Recommended at Discharge Frequent or constant Supervision/Assistance  Patient can return home with the following Two people to help with walking and/or transfers;Two people to help with bathing/dressing/bathroom;Assistance with cooking/housework;Assist for transportation;Help with stairs or ramp for entrance   Equipment Recommendations  None recommended by PT    Recommendations for Other Services       Precautions / Restrictions Precautions Precautions: Fall Precaution Comments: cuffed trach/ trach collar Restrictions Weight Bearing Restrictions: No RLE Weight Bearing: Weight  bearing as tolerated     Mobility  Bed Mobility Overal bed mobility: Needs Assistance Bed Mobility: Supine to Sit, Sit to Supine  Supine to sit: Min assist Sit to supine: Max assist   Transfers Overall transfer level: Needs assistance Equipment used: Rolling walker (2 wheels) Transfers: Sit to/from Stand Sit to Stand: Min assist, Mod assist    General transfer comment: Pt was able to stand EOB 3-4 x. longest standing time 1 min 15 sec. Does have limited tolerance. Will work on getting pt to // bars in future session to progress standing/attempting steps    Ambulation/Gait    General Gait Details: unable   Balance Overall balance assessment: Needs assistance Sitting-balance support: Feet supported Sitting balance-[name] Scale: Good     Standing balance support: Bilateral upper extremity supported Standing balance-[name] Scale: Fair       Cognition Arousal/Alertness: Awake/alert Behavior During Therapy: WFL for tasks assessed/performed Overall Cognitive Status: Within Functional Limits for tasks assessed Area of Impairment: Safety/judgement, Problem solving      Following Commands: Follows one step commands consistently                   General Comments General comments (skin integrity, edema, etc.): pt continues to be somewhat self limiting. He has fear of falling with poor fwd wt shift due to fear of falling      Pertinent Vitals/Pain Pain Assessment Pain Assessment: 0-10 Pain Score: 2  Faces Pain Scale: Hurts a little bit Pain Location: L wrist Pain Descriptors / Indicators: Aching, Sore Pain Intervention(s): Limited activity within patient's tolerance, Monitored during session, Premedicated before session, Repositioned, Patient requesting pain meds-RN notified     PT Goals (current goals can  now be found in the care plan section) Acute Rehab PT Goals Patient Stated Goal: to walk and go home Progress towards PT goals: Not progressing toward goals -  comment (slef limiting and fear of falling)    Frequency    Min 2X/week      PT Plan Current plan remains appropriate    Co-evaluation   Reason for Co-Treatment: Complexity of the patient's impairments (multi-system involvement) PT goals addressed during session: Mobility/safety with mobility;Balance;Proper use of DME;Strengthening/ROM OT goals addressed during session: ADL's and self-care      AM-PAC PT "6 Clicks" Mobility   Outcome Measure  Help needed turning from your back to your side while in a flat bed without using bedrails?: A Lot Help needed moving from lying on your back to sitting on the side of a flat bed without using bedrails?: A Lot Help needed moving to and from a bed to a chair (including a wheelchair)?: A Lot Help needed standing up from a chair using your arms (e.g., wheelchair or bedside chair)?: A Lot Help needed to walk in hospital room?: Total Help needed climbing 3-5 steps with a railing? : Total 6 Click Score: 10    End of Session Equipment Utilized During Treatment: Oxygen (trach collar PMSV) Activity Tolerance: Patient limited by fatigue;Other (comment) (slef limiting) Patient left: in bed;with call bell/phone within reach;with bed alarm set Nurse Communication: Mobility status PT Visit Diagnosis: Unsteadiness on feet (R26.81);Muscle weakness (generalized) (M62.81);Difficulty in walking, not elsewhere classified (R26.2)     Time: 1400-1440 PT Time Calculation (min) (ACUTE ONLY): 40 min  Charges:  $Therapeutic Activity: 8-22 mins                    Julaine Fusi PTA 03/16/22, 4:32 PM

## 2022-03-16 NOTE — Progress Notes (Signed)
Trach care performed. Patient did not wish to be suctioned as he is able to cough out secretions on his own. BBS diminished. Patient able to place himself on home vent for night time use. No distress. Will continue to monitor

## 2022-03-16 NOTE — Progress Notes (Signed)
  Progress Note   Patient: Gary [name] IRS:[id number] DOB: [birth year] DOA: 01/23/2022     41 DOS: the patient was seen and examined on 03/16/2022   Brief hospital course:  Gary [name] is a 66 y.o. male with medical history significant for diastolic CHF, chronic respiratory failure status post tracheostomy, COPD, essential hypertension, PUD and sleep apnea who presented to the emergency room with acute onset of worsening dyspnea.  Apparently patient has been in the ER for the past 10 days as he was unable to be cared at home specially requirement of tracheostomy care, awaiting placement. He developed worsening pulmonary edema. Being admitted for management of acute on chronic diastolic heart failure and given IV Lasix.  Patient condition has gradually improved.  He was able to tolerate nocturnal trilogy ventilation.  Currently pending nursing placement.  Assessment and Plan: Acute on chronic diastolic heart failure: Acute on chronic hypoxic and hypercapnic respiratory failure secondary to advanced COPD Tracheostomy tube dependence: Condition had improved. Currently on oral torsemide.Trach exchange performed on 11/14. Patient doing well, condition more stabilized.  Currently pending nursing home placement. However, patient appears to be progressing well with physical therapy.   Acute kidney injury: > Resolved Could be due to diuretic use. Renal functions are now back to baseline. AKI resolved.   GERD without esophagitis: Continue pantoprazole 40 mg daily.   Obesity, Class III, BMI 40-49.9 (morbid obesity) ([place]) Estimated body mass index is 48.12 kg/m as calculated from the following:   Height as of this encounter: '6\' 3"'$  (1.905 m).   Weight as of this encounter: 174.6 kg.    Anxiety and depression Continue Lexapro.   Dyslipidemia: Continue statin therapy..   Microcytic normochromic anemia Iron deficiency anemia. Continue iron supplementation. s/p IV iron x3 doses   Right  knee pain secondary to lateral and medial meniscal tear. MRI knee confirms.--ortho consulted, most likely require a total knee replacement to address both the meniscal pathology and the degenerative changes, however, pt currently is not a surgical candidate. --s/p Kenalog injection on 11/10 with improved symptoms.   Hypokalemia: resolved   No new issues today, still pending placement.      Subjective:  Patient doing well, no complaint.  Physical Exam: Vitals:   03/15/22 0400 03/15/22 0619 03/15/22 0900 03/15/22 2000  BP:  115/75 118/80 121/73  Pulse:   78 75  Resp: (!) 21 (!) 22 18   Temp:   98.4 F (36.9 C) 98.4 F (36.9 C)  TempSrc:   Oral Oral  SpO2:  96% 95% 95%  Weight:      Height:       General exam: Appears calm and comfortable, morbidly obese. Respiratory system: Clear to auscultation. Respiratory effort normal. Cardiovascular system: S1 & S2 heard, RRR. No JVD, murmurs, rubs, gallops or clicks. No pedal edema. Gastrointestinal system: Abdomen is nondistended, soft and nontender. No organomegaly or masses felt. Normal bowel sounds heard. Central nervous system: Alert and oriented. No focal neurological deficits. Extremities: Symmetric 5 x 5 power. Skin: No rashes, lesions or ulcers Psychiatry: Judgement and insight appear normal. Mood & affect appropriate.   Data Reviewed:  There are no new results to review at this time.  Family Communication: None  Disposition: Status is: Inpatient Remains inpatient appropriate because: Unsafe discharge options.  Planned Discharge Destination: Skilled nursing facility    Time spent: 25 minutes  Author: Sharen Hones, MD 03/16/2022 2:38 PM  For on call review [email or web address].

## 2022-03-16 NOTE — Progress Notes (Signed)
Occupational Therapy Treatment Patient Details Name: Gary [name] MRN: [id number] DOB: Jul 13, 1955 Today's Date: 03/16/2022   History of present illness Pt is a 66 y.o. 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  Prolonged admittances earlier this year for similar. MD assessment includes: Acute on chronic diastolic CHF, AKI, and acute on chronic respiratory failure with hypoxia.   OT comments  Gary [name] was seen for OT treatment on this date. Upon arrival to room pt reclined in bed, PT at bed side, agreeable to tx. Pt requires MIN A exit bed, good static sitting balance. MIN/MOD A x2 + RW for x3 sit<>stand. Tolerates 75", 30", and 30". Pt making good progress toward goals, will continue to follow POC. Discharge recommendation remains appropriate.     Recommendations for follow up therapy are one component of a multi-disciplinary discharge planning process, led by the attending physician.  Recommendations may be updated based on patient status, additional functional criteria and insurance authorization.    Follow Up Recommendations  Skilled nursing-short term rehab (<3 hours/day)     Assistance Recommended at Discharge Frequent or constant Supervision/Assistance  Patient can return home with the following  Two people to help with walking and/or transfers;Two people to help with bathing/dressing/bathroom   Equipment Recommendations  Hospital bed    Recommendations for Other Services      Precautions / Restrictions Precautions Precautions: Fall Restrictions Weight Bearing Restrictions: No       Mobility Bed Mobility Overal bed mobility: Needs Assistance Bed Mobility: Supine to Sit, Sit to Supine     Supine to sit: Min assist Sit to supine: Max assist        Transfers Overall transfer level: Needs assistance Equipment used: Rolling walker (2  wheels) Transfers: Sit to/from Stand Sit to Stand: Min assist, Mod assist, +2 physical assistance           General transfer comment: assist for anterior weight shifting at lift off and for upright posture     Balance Overall balance assessment: Needs assistance Sitting-balance support: Feet supported Sitting balance-[name] Scale: Good     Standing balance support: Bilateral upper extremity supported Standing balance-[name] Scale: Fair Standing balance comment: standing tolerance of 75 seconds, 30 seconds, 30 seconds                           ADL either performed or assessed with clinical judgement   ADL Overall ADL's : Needs assistance/impaired                                       General ADL Comments: MAX don B shoes at EOB. Requires BUE support static standing      Cognition Arousal/Alertness: Awake/alert Behavior During Therapy: WFL for tasks assessed/performed Overall Cognitive Status: Within Functional Limits for tasks assessed Area of Impairment: Safety/judgement, Problem solving                       Following Commands: Follows one step commands consistently Safety/Judgement: Decreased awareness of safety, Decreased awareness of deficits                         Pertinent Vitals/ Pain       Pain Assessment Pain  Assessment: Faces Faces Pain Scale: Hurts a little bit Pain Location: L wrist Pain Descriptors / Indicators: Aching, Sore Pain Intervention(s): Limited activity within patient's tolerance, Other (comment) (ace wrap)   Frequency  Min 2X/week        Progress Toward Goals  OT Goals(current goals can now be found in the care plan section)  Progress towards OT goals: Progressing toward goals  Acute Rehab OT Goals Patient Stated Goal: to walk OT Goal Formulation: With patient Time For Goal Achievement: 03/20/22 Potential to Achieve Goals: Fair ADL Goals Pt Will Perform Grooming: with modified  independence;sitting Pt Will Perform Lower Body Dressing: sitting/lateral leans;with min assist Pt Will Transfer to Toilet: with min assist;bedside commode;stand pivot transfer  Plan Discharge plan remains appropriate;Frequency remains appropriate    Co-evaluation    PT/OT/SLP Co-Evaluation/Treatment: Yes Reason for Co-Treatment: To address functional/ADL transfers PT goals addressed during session: Mobility/safety with mobility OT goals addressed during session: ADL's and self-care      AM-PAC OT "6 Clicks" Daily Activity     Outcome Measure   Help from another person eating meals?: None Help from another person taking care of personal grooming?: A Little Help from another person toileting, which includes using toliet, bedpan, or urinal?: A Lot Help from another person bathing (including washing, rinsing, drying)?: A Lot Help from another person to put on and taking off regular upper body clothing?: A Little Help from another person to put on and taking off regular lower body clothing?: A Lot 6 Click Score: 16    End of Session Equipment Utilized During Treatment: Rolling walker (2 wheels)  OT Visit Diagnosis: Other abnormalities of gait and mobility (R26.89);Muscle weakness (generalized) (M62.81)   Activity Tolerance Patient tolerated treatment well   Patient Left in bed;with call bell/phone within reach   Nurse Communication          Time: 2620-3559 OT Time Calculation (min): 34 min  Charges: OT General Charges $OT Visit: 1 Visit OT Treatments $Therapeutic Activity: 8-22 mins  [name], M.S. OTR/L  03/16/22, 2:48 PM  ascom [phone]

## 2022-03-16 NOTE — Plan of Care (Signed)
Continuing with plan of care. 

## 2022-03-17 DIAGNOSIS — I5033 Acute on chronic diastolic (congestive) heart failure: Secondary | ICD-10-CM | POA: Diagnosis not present

## 2022-03-17 MED ORDER — KETOROLAC TROMETHAMINE 30 MG/ML IJ SOLN
15.0000 mg | Freq: Once | INTRAMUSCULAR | Status: AC
  Administered 2022-03-17: 15 mg via INTRAVENOUS
  Filled 2022-03-17: qty 1

## 2022-03-17 NOTE — Progress Notes (Signed)
PROGRESS NOTE    Gary [name]  TIW:[id number] DOB: 1956/02/27 DOA: 01/23/2022 PCP: [name]  [id number]  LOS: 42 days   Brief hospital course: Gary [name] is a 66 y.o. male with medical history significant for diastolic CHF, chronic respiratory failure status post tracheostomy, COPD, essential hypertension, PUD and sleep apnea who presented to the emergency room with acute onset of worsening dyspnea.  Apparently patient has been in the ER for the past 10 days as he was unable to be cared at home specially requirement of tracheostomy care, awaiting placement. He developed worsening pulmonary edema. Being admitted for management of acute on chronic diastolic heart failure and given IV Lasix.  Patient condition has gradually improved.  He was able to tolerate nocturnal trilogy ventilation.  Currently pending nursing placement.  Assessment & Plan: Acute on chronic diastolic heart failure: Acute on chronic hypoxic and hypercapnic respiratory failure secondary to advanced COPD Tracheostomy tube dependence: Condition had improved. Currently on oral torsemide.Trach exchange performed on 11/14. Patient doing well, condition more stabilized.  Currently pending nursing home placement. However, patient appears to be progressing well with physical therapy.   Acute kidney injury: > Resolved Could be due to diuretic use. Renal functions are now back to baseline. AKI resolved.   GERD without esophagitis: Continue pantoprazole 40 mg daily.   Obesity, Class III, BMI 40-49.9 (morbid obesity) ([place]) Estimated body mass index is 48.12 kg/m as calculated from the following:   Height as of this encounter: '6\' 3"'$  (1.905 m).   Weight as of this encounter: 174.6 kg.    Anxiety and depression Continue Lexapro.   Dyslipidemia: Continue statin therapy..   Microcytic normochromic anemia Iron deficiency anemia. Continue iron supplementation. s/p IV iron x3 doses   Right  knee pain secondary to lateral and medial meniscal tear. MRI knee confirms.--ortho consulted, most likely require a total knee replacement to address both the meniscal pathology and the degenerative changes, however, pt currently is not a surgical candidate. --s/p Kenalog injection on 11/10 with improved symptoms.   Hypokalemia --cont daily potassium supplement    DVT prophylaxis: [id number] Code Status: Full code  Family Communication:  Level of care: Progressive Dispo:   The patient is from: home Anticipated d/c is to: SNF with vent capability  Anticipated d/c date is: whenever bed available   Subjective and Interval History:  Pt reported he is improving with PT and now can stand during his sessions.   Objective: Vitals:   03/16/22 2000 03/17/22 0832 03/17/22 0833 03/17/22 1600  BP:  120/85 120/85 (!) 115/95  Pulse: 73 75 75 86  Resp: '18 17 15 '$ (!) 23  Temp: 98.4 F (36.9 C) 97.8 F (36.6 C)  98 F (36.7 C)  TempSrc: Oral   Oral  SpO2: 95% 92% 92% 96%  Weight:      Height:        Intake/Output Summary (Last 24 hours) at 03/17/2022 1611 Last data filed at 03/17/2022 1600 Gross per 24 hour  Intake 960 ml  Output 2300 ml  Net -1340 ml   Filed Weights   03/03/22 0500 03/10/22 0500 03/11/22 0500  Weight: (!) 186.4 kg (!) 186.4 kg (!) 185.9 kg    Examination:   Constitutional: NAD, AAOx3 HEENT: conjunctivae and lids normal, EOMI CV: No cyanosis.   RESP: trach present SKIN: warm, dry Neuro: II - XII grossly intact.   Psych: Normal mood and affect.     Data Reviewed: I have personally reviewed labs  and imaging studies  Time spent: 25 minutes  Enzo Bi, MD [facility] If 7PM-7AM, please contact night-coverage 03/17/2022, 4:11 PM

## 2022-03-17 NOTE — Plan of Care (Signed)
Pt progressing towards goal and continue to wait for placement.

## 2022-03-18 DIAGNOSIS — E876 Hypokalemia: Secondary | ICD-10-CM | POA: Diagnosis not present

## 2022-03-18 DIAGNOSIS — J9621 Acute and chronic respiratory failure with hypoxia: Secondary | ICD-10-CM | POA: Diagnosis not present

## 2022-03-18 DIAGNOSIS — F419 Anxiety disorder, unspecified: Secondary | ICD-10-CM

## 2022-03-18 DIAGNOSIS — J449 Chronic obstructive pulmonary disease, unspecified: Secondary | ICD-10-CM | POA: Diagnosis not present

## 2022-03-18 DIAGNOSIS — F32A Depression, unspecified: Secondary | ICD-10-CM

## 2022-03-18 DIAGNOSIS — I5033 Acute on chronic diastolic (congestive) heart failure: Secondary | ICD-10-CM | POA: Diagnosis not present

## 2022-03-18 LAB — BASIC METABOLIC PANEL
Anion gap: 8 (ref 5–15)
BUN: 38 mg/dL — ABNORMAL HIGH (ref 8–23)
CO2: 31 mmol/L (ref 22–32)
Calcium: 8.9 mg/dL (ref 8.9–10.3)
Chloride: 102 mmol/L (ref 98–111)
Creatinine, Ser: 1.4 mg/dL — ABNORMAL HIGH (ref 0.61–1.24)
GFR, Estimated: 56 mL/min — ABNORMAL LOW (ref >60)
Glucose, Bld: 102 mg/dL — ABNORMAL HIGH (ref 70–99)
Potassium: 4.3 mmol/L (ref 3.5–5.1)
Sodium: 141 mmol/L (ref 135–145)

## 2022-03-18 LAB — CBC
HCT: 37.2 % — ABNORMAL LOW (ref 39.0–52.0)
Hemoglobin: 10.2 g/dL — ABNORMAL LOW (ref 13.0–17.0)
MCH: 21.5 pg — ABNORMAL LOW (ref 26.0–34.0)
MCHC: 27.4 g/dL — ABNORMAL LOW (ref 30.0–36.0)
MCV: 78.3 fL — ABNORMAL LOW (ref 80.0–100.0)
Platelets: 166 10*3/uL (ref 150–400)
RBC: 4.75 MIL/uL (ref 4.22–5.81)
RDW: 19.2 % — ABNORMAL HIGH (ref 11.5–15.5)
WBC: 6.2 10*3/uL (ref 4.0–10.5)
nRBC: 0 % (ref 0.0–0.2)

## 2022-03-18 LAB — MAGNESIUM: Magnesium: 2.3 mg/dL (ref 1.7–2.4)

## 2022-03-18 NOTE — Assessment & Plan Note (Addendum)
-  continue PPI therapy

## 2022-03-18 NOTE — Assessment & Plan Note (Signed)
Estimated body mass index is 50.62 kg/m as calculated from the following:   Height as of this encounter: '6\' 3"'$  (1.905 m).   Weight as of this encounter: 545.6 kg.   -Complicates prognosis

## 2022-03-18 NOTE — Assessment & Plan Note (Addendum)
-   continue Lexapro and Klonopin

## 2022-03-18 NOTE — Assessment & Plan Note (Signed)
-   continue statin therapy. 

## 2022-03-18 NOTE — Assessment & Plan Note (Signed)
repleted ?

## 2022-03-18 NOTE — Progress Notes (Signed)
  Progress Note   Patient: Gary [name]:[id number] DOB: 01-Jan-1956 DOA: 01/23/2022     43 DOS: the patient was seen and examined on 03/18/2022   Brief hospital course:  Gary [name] is a 66 y.o. male with medical history significant for diastolic CHF, chronic respiratory failure status post tracheostomy, COPD, essential hypertension, PUD and sleep apnea who presented to the emergency room with acute onset of worsening dyspnea.  Apparently patient has been in the ER for the past 10 days as he was unable to be cared at home specially requirement of tracheostomy care, awaiting placement. He developed worsening pulmonary edema. Being admitted for management of acute on chronic diastolic heart failure and given IV Lasix.  Patient condition has gradually improved.  He was able to tolerate nocturnal trilogy ventilation.  Currently pending nursing placement.  12/3: medically stable, waiting for placement  Assessment and Plan: * Acute on chronic diastolic (congestive) heart failure ([place]) Patient to have some peripheral edema and concern of pulmonary vascular congestion on chest x-ray.   - Continue torsemide  - seems well compensated now.   Acute on chronic respiratory failure with hypoxia ([name]) - Currently stable on baseline which required intermittent vent support with trach collar.  AKI (acute kidney injury) ([place]) Resolved after holding IV Lasix -Avoid nephrotoxins  Chronic obstructive pulmonary disease (COPD) ([name]) - Continue bronchodilators and home inhalers  GERD without esophagitis - continue PPI therapy.  Obesity, Class III, BMI 40-49.9 (morbid obesity) ([name]) Estimated body mass index is 50.62 kg/m as calculated from the following:   Height as of this encounter: '6\' 3"'$  (1.905 m).   Weight as of this encounter: 245.8 kg.   -Complicates prognosis  Anxiety and depression - continue Lexapro and Klonopin  Dyslipidemia - continue statin  therapy.  Hypokalemia repleted        Subjective: says he isn't going to any nursing home   Physical Exam: Vitals:   03/18/22 0838 03/18/22 1321 03/18/22 1326 03/18/22 1655  BP: 109/73  103/67 125/78  Pulse: 85 83  81  Resp: (!) 21 18  (!) 30  Temp: 97.7 F (36.5 C) 99 F (37.2 C)  98.4 F (36.9 C)  TempSrc: Oral Oral  Oral  SpO2: 94% 92%  92%  Weight:      Height:       Constitutional: NAD, AAOx3 HEENT: conjunctivae and lids normal, EOMI CV: No cyanosis.   RESP: trach present SKIN: warm, dry Neuro: II - XII grossly intact.   Psych: Normal mood and affect.   Data Reviewed:  There are no new results to review at this time.  Family Communication: none  Disposition: Status is: Inpatient Remains inpatient appropriate because: waiting for placement  Planned Discharge Destination: Skilled nursing facility   DVT prophylaxis - Eliquis Time spent: 25 minutes  Author: Max Sane, MD 03/18/2022 6:00 PM  For on call review [email or web address].

## 2022-03-19 DIAGNOSIS — N179 Acute kidney failure, unspecified: Secondary | ICD-10-CM | POA: Diagnosis not present

## 2022-03-19 DIAGNOSIS — J9621 Acute and chronic respiratory failure with hypoxia: Secondary | ICD-10-CM | POA: Diagnosis not present

## 2022-03-19 DIAGNOSIS — I5033 Acute on chronic diastolic (congestive) heart failure: Secondary | ICD-10-CM | POA: Diagnosis not present

## 2022-03-19 DIAGNOSIS — J449 Chronic obstructive pulmonary disease, unspecified: Secondary | ICD-10-CM | POA: Diagnosis not present

## 2022-03-19 MED ORDER — TRAMADOL-ACETAMINOPHEN 37.5-325 MG PO TABS
1.0000 | ORAL_TABLET | Freq: Four times a day (QID) | ORAL | Status: DC | PRN
  Administered 2022-03-19 – 2022-04-04 (×6): 1 via ORAL
  Filled 2022-03-19 (×5): qty 1

## 2022-03-19 NOTE — Assessment & Plan Note (Signed)
-   continue Lexapro and Klonopin

## 2022-03-19 NOTE — Assessment & Plan Note (Signed)
-   Continue bronchodilators and home inhalers

## 2022-03-19 NOTE — Assessment & Plan Note (Signed)
Patient to have some peripheral edema and concern of pulmonary vascular congestion on chest x-ray.   - Continue torsemide  - seems well compensated now.

## 2022-03-19 NOTE — Progress Notes (Signed)
Physical Therapy Treatment Patient Details Name: Gary [name] MRN: [id number] DOB: Jan 31, 1956 Today's Date: 03/19/2022   History of Present Illness Pt is a 66 y.o. 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  Prolonged admittances earlier this year for similar. MD assessment includes: Acute on chronic diastolic CHF, AKI, and acute on chronic respiratory failure with hypoxia.    PT Comments    Patient is agreeable to PT treatment. He was able to stand x 2 bouts with Min A +2 person. Facilitation and cues for anterior weight shifting to facilitate lift off for standing. Standing tolerance of 45 seconds and 1 minute. The patient was unable to pivot to the chair in standing due to difficulty weight shifting. Used the overhead lift to get patient from the bed to chair per patient request which is recommended routinely for upright conditioning. Recommend PT follow up to maximize independence and facilitate return to prior level of function.    Recommendations for follow up therapy are one component of a multi-disciplinary discharge planning process, led by the attending physician.  Recommendations may be updated based on patient status, additional functional criteria and insurance authorization.  Follow Up Recommendations  Skilled nursing-short term rehab (<3 hours/day) Can patient physically be transported by private vehicle: No   Assistance Recommended at Discharge Frequent or constant Supervision/Assistance  Patient can return home with the following Two people to help with walking and/or transfers;Two people to help with bathing/dressing/bathroom;Assistance with cooking/housework;Assist for transportation;Help with stairs or ramp for entrance   Equipment Recommendations  None recommended by PT    Recommendations for Other Services       Precautions / Restrictions  Precautions Precautions: Fall Restrictions Weight Bearing Restrictions: Yes RLE Weight Bearing: Weight bearing as tolerated     Mobility  Bed Mobility Overal bed mobility: Needs Assistance Bed Mobility: Supine to Sit     Supine to sit: Min assist     General bed mobility comments: assistance for trunk support and intermittent LLE support provided. cues for task initiation    Transfers Overall transfer level: Needs assistance Equipment used: Rolling walker (2 wheels) (pink bariatric rolling walker) Transfers: Sit to/from Stand Sit to Stand: Min assist, +2 physical assistance, From elevated surface           General transfer comment: 2 bouts of standing performed from bed. verbal cues for anterior weight shifting, foot placement. patient was unable to weight shift for pivoting in standing to get to the chair. overhead room lift was used to get patient from bed to bariatric recliner chair in room. (sling donned while sitting on edge of bed with cues for lateral weight shifting for placement of the sling)    Ambulation/Gait                   Stairs             Wheelchair Mobility    Modified Rankin (Stroke Patients Only)       Balance Overall balance assessment: Needs assistance Sitting-balance support: Feet supported Sitting balance-[name] Scale: Good     Standing balance support: Bilateral upper extremity supported Standing balance-[name] Scale: Fair Standing balance comment: 2 standing bouts performed with heavy use of rolling walker for support. standing tolerance of 45 seconds and 1 minute  Cognition Arousal/Alertness: Awake/alert Behavior During Therapy: WFL for tasks assessed/performed Overall Cognitive Status: Within Functional Limits for tasks assessed                                          Exercises      General Comments        Pertinent Vitals/Pain Pain Assessment Pain  Assessment: Faces Faces Pain Scale: Hurts a little bit Pain Location: L wrist and bilateral feet Pain Descriptors / Indicators: Sore Pain Intervention(s): Limited activity within patient's tolerance, Monitored during session    Home Living                          Prior Function            PT Goals (current goals can now be found in the care plan section) Acute Rehab PT Goals Patient Stated Goal: to walk PT Goal Formulation: With patient Time For Goal Achievement: 03/23/22 Potential to Achieve Goals: Fair Progress towards PT goals: Progressing toward goals    Frequency    Min 2X/week      PT Plan Current plan remains appropriate    Co-evaluation PT/OT/SLP Co-Evaluation/Treatment: Yes Reason for Co-Treatment: To address functional/ADL transfers PT goals addressed during session: Mobility/safety with mobility        AM-PAC PT "6 Clicks" Mobility   Outcome Measure  Help needed turning from your back to your side while in a flat bed without using bedrails?: A Lot Help needed moving from lying on your back to sitting on the side of a flat bed without using bedrails?: A Lot Help needed moving to and from a bed to a chair (including a wheelchair)?: A Lot Help needed standing up from a chair using your arms (e.g., wheelchair or bedside chair)?: A Lot Help needed to walk in hospital room?: Total Help needed climbing 3-5 steps with a railing? : Total 6 Click Score: 10    End of Session Equipment Utilized During Treatment:  (trach collar with oxygen) Activity Tolerance: Patient limited by fatigue;Other (comment) Patient left: in chair;with call bell/phone within reach Nurse Communication: Mobility status;Need for lift equipment PT Visit Diagnosis: Unsteadiness on feet (R26.81);Muscle weakness (generalized) (M62.81);Difficulty in walking, not elsewhere classified (R26.2)     Time: 2111-5520 PT Time Calculation (min) (ACUTE ONLY): 30 min  Charges:   $Therapeutic Activity: 8-22 mins                    [name], PT, MPT  [name] 03/19/2022, 2:06 PM

## 2022-03-19 NOTE — Assessment & Plan Note (Signed)
-   Currently stable on baseline which required intermittent vent support with trach collar.

## 2022-03-19 NOTE — Assessment & Plan Note (Signed)
-  continue PPI therapy

## 2022-03-19 NOTE — Progress Notes (Signed)
  Progress Note   Patient: Gary [name] HYI:[id number] DOB: November 14, 1955 DOA: 01/23/2022     44 DOS: the patient was seen and examined on 03/19/2022   Brief hospital course:  ADARIUS TIGGES is a 66 y.o. male with medical history significant for diastolic CHF, chronic respiratory failure status post tracheostomy, COPD, essential hypertension, PUD and sleep apnea who presented to the emergency room with acute onset of worsening dyspnea.  Apparently patient has been in the ER for the past 10 days as he was unable to be cared at home specially requirement of tracheostomy care, awaiting placement. He developed worsening pulmonary edema. Being admitted for management of acute on chronic diastolic heart failure and given IV Lasix.  Patient condition has gradually improved.  He was able to tolerate nocturnal trilogy ventilation.  Currently pending nursing placement.  12/3-4: medically stable, waiting for placement  Assessment and Plan: * Acute on chronic diastolic (congestive) heart failure ([name]) Patient to have some peripheral edema and concern of pulmonary vascular congestion on chest x-ray.   - Continue torsemide  - seems well compensated now.   Acute on chronic respiratory failure with hypoxia ([name]) - Currently stable on baseline which required intermittent vent support with trach collar.  AKI (acute kidney injury) ([place]) Resolved after holding IV Lasix -Avoid nephrotoxins  Chronic obstructive pulmonary disease (COPD) ([name]) - Continue bronchodilators and home inhalers  GERD without esophagitis - continue PPI therapy.  Obesity, Class III, BMI 40-49.9 (morbid obesity) ([name]) Estimated body mass index is 50.62 kg/m as calculated from the following:   Height as of this encounter: '6\' 3"'$  (1.905 m).   Weight as of this encounter: 786.7 kg.   -Complicates prognosis  Anxiety and depression - continue Lexapro and Klonopin  Dyslipidemia - continue statin  therapy.  Hypokalemia repleted        Subjective: not happy about talk about going any other place than home, wife over the phone adamant he's not going home  Physical Exam: Vitals:   03/19/22 0500 03/19/22 0600 03/19/22 0910 03/19/22 1600  BP:   122/75 118/72  Pulse:      Resp: 14 (!) 21 (!) 26   Temp:   98.5 F (36.9 C)   TempSrc:   Axillary   SpO2:    95%  Weight:      Height:       Constitutional: NAD, AAOx3 HEENT: conjunctivae and lids normal, EOMI CV: No cyanosis.   RESP: trach present SKIN: warm, dry Neuro: II - XII grossly intact.   Psych: Normal mood and affect.   Data Reviewed:  There are no new results to review at this time.  Family Communication: Wife updated over phone  Disposition: Status is: Inpatient Remains inpatient appropriate because: medically stable, waiting for placement  Planned Discharge Destination: Skilled nursing facility   DVT prophylaxis - Eliquis Time spent: 35 minutes  Author: Max Sane, MD 03/19/2022 5:17 PM  For on call review [email or web address].

## 2022-03-19 NOTE — Assessment & Plan Note (Signed)
-   continue statin therapy. 

## 2022-03-19 NOTE — Plan of Care (Signed)
  Problem: Education: Goal: Ability to demonstrate management of disease process will improve Outcome: Progressing Goal: Ability to verbalize understanding of medication therapies will improve Outcome: Progressing   Problem: Activity: Goal: Capacity to carry out activities will improve Outcome: Progressing   Problem: Cardiac: Goal: Ability to achieve and maintain adequate cardiopulmonary perfusion will improve Outcome: Progressing   Problem: Education: Goal: Knowledge of General Education information will improve Description: Including pain rating scale, medication(s)/side effects and non-pharmacologic comfort measures Outcome: Progressing   Problem: Health Behavior/Discharge Planning: Goal: Ability to manage health-related needs will improve Outcome: Progressing   Problem: Health Behavior/Discharge Planning: Goal: Ability to manage health-related needs will improve Outcome: Progressing   Problem: Clinical Measurements: Goal: Ability to maintain clinical measurements within normal limits will improve Outcome: Progressing Goal: Will remain free from infection Outcome: Progressing Goal: Diagnostic test results will improve Outcome: Progressing Goal: Respiratory complications will improve Outcome: Progressing Goal: Cardiovascular complication will be avoided Outcome: Progressing   Problem: Activity: Goal: Risk for activity intolerance will decrease Outcome: Progressing   Problem: Nutrition: Goal: Adequate nutrition will be maintained Outcome: Progressing   Problem: Coping: Goal: Level of anxiety will decrease Outcome: Progressing   Problem: Elimination: Goal: Will not experience complications related to bowel motility Outcome: Progressing Goal: Will not experience complications related to urinary retention Outcome: Progressing   Problem: Pain Managment: Goal: General experience of comfort will improve Outcome: Progressing   Problem: Safety: Goal: Ability to  remain free from injury will improve Outcome: Progressing   Problem: Skin Integrity: Goal: Risk for impaired skin integrity will decrease Outcome: Progressing

## 2022-03-19 NOTE — Assessment & Plan Note (Signed)
Resolved after holding IV Lasix -Avoid nephrotoxins

## 2022-03-19 NOTE — Assessment & Plan Note (Signed)
Estimated body mass index is 50.62 kg/m as calculated from the following:   Height as of this encounter: '6\' 3"'$  (1.905 m).   Weight as of this encounter: 381.7 kg.   -Complicates prognosis

## 2022-03-19 NOTE — Progress Notes (Signed)
Occupational Therapy Treatment Patient Details Name: Gary [name] MRN: [id number] DOB: Oct 19, 1955 Today's Date: 03/19/2022   History of present illness Pt is a 66 y.o. 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  Prolonged admittances earlier this year for similar. MD assessment includes: Acute on chronic diastolic CHF, AKI, and acute on chronic respiratory failure with hypoxia.   OT comments  Mr Quintanar was seen for OT treatment on this date. Upon arrival to room pt reclined in bed, agreeable to tx. Pt requires MIN A exit bed. MAX A don B shoes in sitting. MAX A x2 + RW pericare in standing. Attempted SPT bed>chair however difficulty weight shifting to pivot, completed with overhead lift from sitting position at EOB. Goals continue to be appropriate and updated to reflect progress, will continue to follow POC. Discharge recommendation remains appropriate.     Recommendations for follow up therapy are one component of a multi-disciplinary discharge planning process, led by the attending physician.  Recommendations may be updated based on patient status, additional functional criteria and insurance authorization.    Follow Up Recommendations  Skilled nursing-short term rehab (<3 hours/day)     Assistance Recommended at Discharge Frequent or constant Supervision/Assistance  Patient can return home with the following  Two people to help with walking and/or transfers;Two people to help with bathing/dressing/bathroom   Equipment Recommendations  Hospital bed    Recommendations for Other Services      Precautions / Restrictions Precautions Precautions: Fall Restrictions Weight Bearing Restrictions: Yes RLE Weight Bearing: Weight bearing as tolerated       Mobility Bed Mobility Overal bed mobility: Needs Assistance Bed Mobility: Supine to Sit     Supine to sit: Min  assist     General bed mobility comments: assistance for trunk support and intermittent LLE support provided. cues for task initiation    Transfers Overall transfer level: Needs assistance Equipment used: Rolling walker (2 wheels) (bariatric rolling walker) Transfers: Sit to/from Stand Sit to Stand: Min assist, +2 physical assistance, From elevated surface           General transfer comment: x2 sit<>stand. overhead room lift for OOB to chair (sling donned while sitting on edge of bed with cues for lateral weight shifting for placement of the sling)     Balance Overall balance assessment: Needs assistance Sitting-balance support: Feet supported Sitting balance-[name] Scale: Good     Standing balance support: Bilateral upper extremity supported Standing balance-[name] Scale: Fair Standing balance comment: 2 standing bouts performed with heavy use of rolling walker for support. standing tolerance of 45 seconds and 1 minute                           ADL either performed or assessed with clinical judgement   ADL Overall ADL's : Needs assistance/impaired                                       General ADL Comments: MAX A don B shoes in sitting. MAX A x2 + RW pericare in standing      Cognition Arousal/Alertness: Awake/alert Behavior During Therapy: WFL for tasks assessed/performed Overall Cognitive Status: Within Functional Limits for tasks assessed  Pertinent Vitals/ Pain       Pain Assessment Faces Pain Scale: Hurts a little bit Pain Location: L wrist and bilateral feet Pain Descriptors / Indicators: Sore   Frequency  Min 2X/week        Progress Toward Goals  OT Goals(current goals can now be found in the care plan section)  Progress towards OT goals: Progressing toward goals  Acute Rehab OT Goals Patient Stated Goal: to go home OT Goal Formulation: With  patient Time For Goal Achievement: 04/02/22 Potential to Achieve Goals: Fair ADL Goals Pt Will Perform Grooming: with modified independence;sitting Pt Will Perform Lower Body Dressing: sitting/lateral leans;with min assist Pt Will Transfer to Toilet: with min assist;bedside commode;stand pivot transfer  Plan Discharge plan remains appropriate;Frequency remains appropriate    Co-evaluation      Reason for Co-Treatment: To address functional/ADL transfers PT goals addressed during session: Mobility/safety with mobility OT goals addressed during session: ADL's and self-care      AM-PAC OT "6 Clicks" Daily Activity     Outcome Measure   Help from another person eating meals?: None Help from another person taking care of personal grooming?: A Little Help from another person toileting, which includes using toliet, bedpan, or urinal?: A Lot Help from another person bathing (including washing, rinsing, drying)?: A Lot Help from another person to put on and taking off regular upper body clothing?: A Little Help from another person to put on and taking off regular lower body clothing?: A Lot 6 Click Score: 16    End of Session    OT Visit Diagnosis: Other abnormalities of gait and mobility (R26.89);Muscle weakness (generalized) (M62.81)   Activity Tolerance Patient tolerated treatment well   Patient Left with call bell/phone within reach;in chair   Nurse Communication Mobility status        Time: 0737-1062 OT Time Calculation (min): 30 min  Charges: OT General Charges $OT Visit: 1 Visit OT Treatments $Self Care/Home Management : 8-22 mins  [name], M.S. OTR/L  03/19/22, 3:33 PM  ascom [phone]

## 2022-03-19 NOTE — Progress Notes (Signed)
2100 patient alert x4 able to make all needs known on TC 5L 28% patient in chair overhead lift used to place patient back in bed 2200 all meds given PO as ordered patient brushed teeth

## 2022-03-19 NOTE — Assessment & Plan Note (Signed)
repleted ?

## 2022-03-20 DIAGNOSIS — J9621 Acute and chronic respiratory failure with hypoxia: Secondary | ICD-10-CM | POA: Diagnosis not present

## 2022-03-20 DIAGNOSIS — N179 Acute kidney failure, unspecified: Secondary | ICD-10-CM | POA: Diagnosis not present

## 2022-03-20 DIAGNOSIS — I5033 Acute on chronic diastolic (congestive) heart failure: Secondary | ICD-10-CM | POA: Diagnosis not present

## 2022-03-20 DIAGNOSIS — J449 Chronic obstructive pulmonary disease, unspecified: Secondary | ICD-10-CM | POA: Diagnosis not present

## 2022-03-20 NOTE — Assessment & Plan Note (Signed)
-   continue statin therapy. 

## 2022-03-20 NOTE — Progress Notes (Signed)
Physical Therapy Treatment Patient Details Name: Gary [name] MRN: [id number] DOB: 1955/09/25 Today's Date: 03/20/2022   History of Present Illness Pt is a 66 y.o. 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  Prolonged admittances earlier this year for similar. MD assessment includes: Acute on chronic diastolic CHF, AKI, and acute on chronic respiratory failure with hypoxia.    PT Comments    Patient is agreeable to PT and motivated to stand. He reports staying up in the chair until 9pm yesterday. Four standing bouts performed from the bed with Min A +2 person assistance. Pre-gait activity performed and patient is able to take 2 small backwards steps with BLE with cues for weight shifting. On the same standing bout, patient was also able to lift the right foot off the ground. Unable to pivot to the chair from standing despite cues for technique due to limited standing tolerance. Overhead lift was used to get patient to the chair. PT will continue to follow.    Recommendations for follow up therapy are one component of a multi-disciplinary discharge planning process, led by the attending physician.  Recommendations may be updated based on patient status, additional functional criteria and insurance authorization.  Follow Up Recommendations  Skilled nursing-short term rehab (<3 hours/day) Can patient physically be transported by private vehicle: No   Assistance Recommended at Discharge Frequent or constant Supervision/Assistance  Patient can return home with the following Two people to help with walking and/or transfers;Two people to help with bathing/dressing/bathroom;Assistance with cooking/housework;Assist for transportation;Help with stairs or ramp for entrance   Equipment Recommendations  None recommended by PT    Recommendations for Other Services       Precautions /  Restrictions Precautions Precautions: Fall Restrictions Weight Bearing Restrictions: Yes RLE Weight Bearing: Weight bearing as tolerated     Mobility  Bed Mobility Overal bed mobility: Needs Assistance Bed Mobility: Supine to Sit     Supine to sit: Min assist     General bed mobility comments: assistance for LLE . increased time and effort. cues for task initiation    Transfers Overall transfer level: Needs assistance Equipment used: Rolling walker (2 wheels) (pink bariatric rolling walker) Transfers: Sit to/from Stand Sit to Stand: Min assist, +2 physical assistance, From elevated surface           General transfer comment: 4 bouts of standing from bed. verbal cues for anterior weight shifting and for bilateral foot placement to facilitate independence with lift off. short rest breaks required between standing bouts due to fatigue. used the sling (placed in sitting position with cues for weight shifting) and the overhead lift to get patient to the chair after the standing bouts    Ambulation/Gait             Pre-gait activities: patient able to take 2 small steps backwards with each LE and lift the right foot off the ground. cues for weight shifting to faciliate movement of BLE. attempted to pivot to the chair, however patient was unable to at this time due to fatigue and limited standing tolerance. longest standing bout was ~ 1 minute today     Stairs             Wheelchair Mobility    Modified Rankin (Stroke Patients Only)       Balance Overall balance assessment: Needs assistance Sitting-balance support: Feet supported Sitting balance-[name]  Scale: Good     Standing balance support: Bilateral upper extremity supported Standing balance-[name] Scale: Fair Standing balance comment: heavy use of rolling walker for support in standing                            Cognition Arousal/Alertness: Awake/alert Behavior During Therapy: WFL for  tasks assessed/performed Overall Cognitive Status: Within Functional Limits for tasks assessed                                          Exercises      General Comments General comments (skin integrity, edema, etc.): patient excited to be up in the chair at end of session      Pertinent Vitals/Pain Pain Assessment Pain Assessment: No/denies pain    Home Living                          Prior Function            PT Goals (current goals can now be found in the care plan section) Acute Rehab PT Goals Patient Stated Goal: to walk PT Goal Formulation: With patient Time For Goal Achievement: 03/23/22 Potential to Achieve Goals: Fair Progress towards PT goals: Progressing toward goals    Frequency    Min 2X/week      PT Plan Current plan remains appropriate    Co-evaluation PT/OT/SLP Co-Evaluation/Treatment: Yes Reason for Co-Treatment: Complexity of the patient's impairments (multi-system involvement);To address functional/ADL transfers PT goals addressed during session: Mobility/safety with mobility        AM-PAC PT "6 Clicks" Mobility   Outcome Measure  Help needed turning from your back to your side while in a flat bed without using bedrails?: A Lot Help needed moving from lying on your back to sitting on the side of a flat bed without using bedrails?: A Lot Help needed moving to and from a bed to a chair (including a wheelchair)?: A Lot Help needed standing up from a chair using your arms (e.g., wheelchair or bedside chair)?: A Lot Help needed to walk in hospital room?: Total Help needed climbing 3-5 steps with a railing? : Total 6 Click Score: 10    End of Session Equipment Utilized During Treatment: Oxygen (trach mask oxygen) Activity Tolerance: Patient tolerated treatment well;Patient limited by fatigue Patient left: in chair;with call bell/phone within reach Nurse Communication: Mobility status PT Visit Diagnosis:  Unsteadiness on feet (R26.81);Muscle weakness (generalized) (M62.81);Difficulty in walking, not elsewhere classified (R26.2)     Time: 2841-3244 PT Time Calculation (min) (ACUTE ONLY): 31 min  Charges:  $Therapeutic Activity: 8-22 mins                     [name], PT, MPT    [name] 03/20/2022, 2:55 PM

## 2022-03-20 NOTE — Assessment & Plan Note (Signed)
Patient to have some peripheral edema and concern of pulmonary vascular congestion on chest x-ray.   - Continue torsemide  - seems well compensated now.

## 2022-03-20 NOTE — Assessment & Plan Note (Signed)
repleted ?

## 2022-03-20 NOTE — Assessment & Plan Note (Signed)
-   continue Lexapro and Klonopin

## 2022-03-20 NOTE — Progress Notes (Signed)
Patient remains compliant with home vent. Trach performed prior to him placing himself on vent for sleep. Suctioning not needed at this time. Patient able to cough out secretions w/o difficulty.[name]

## 2022-03-20 NOTE — Assessment & Plan Note (Signed)
-   Continue bronchodilators and home inhalers

## 2022-03-20 NOTE — Progress Notes (Signed)
  Progress Note   Patient: Gary [name] JQZ:[id number] DOB: [birth year] DOA: 01/23/2022     45 DOS: the patient was seen and examined on 03/20/2022   Brief hospital course:  Gary [name] is a 66 y.o. male with medical history significant for diastolic CHF, chronic respiratory failure status post tracheostomy, COPD, essential hypertension, PUD and sleep apnea who presented to the emergency room with acute onset of worsening dyspnea.  Apparently patient has been in the ER for the past 10 days as he was unable to be cared at home specially requirement of tracheostomy care, awaiting placement. He developed worsening pulmonary edema. Being admitted for management of acute on chronic diastolic heart failure and given IV Lasix.  Patient condition has gradually improved.  He was able to tolerate nocturnal trilogy ventilation.  Currently pending nursing placement.  12/3-5: medically stable, waiting for placement  Assessment and Plan: * Acute on chronic diastolic (congestive) heart failure ([name]) Patient to have some peripheral edema and concern of pulmonary vascular congestion on chest x-ray.   - Continue torsemide  - seems well compensated now.   Acute on chronic respiratory failure with hypoxia ([name]) - Currently stable on baseline which required intermittent vent support with trach collar.  AKI (acute kidney injury) ([place]) Resolved after holding IV Lasix -Avoid nephrotoxins  Chronic obstructive pulmonary disease (COPD) ([name]) - Continue bronchodilators and home inhalers  GERD without esophagitis - continue PPI therapy.  Obesity, Class III, BMI 40-49.9 (morbid obesity) ([name]) Estimated body mass index is 50.62 kg/m as calculated from the following:   Height as of this encounter: '6\' 3"'$  (1.905 m).   Weight as of this encounter: 622.6 kg.   -Complicates prognosis  Anxiety and depression - continue Lexapro and Klonopin  Dyslipidemia - continue statin  therapy.  Hypokalemia repleted        Subjective: Wanting to go home soon, agreeable to work with therapy  Physical Exam: Vitals:   03/20/22 1100 03/20/22 1200 03/20/22 1230 03/20/22 1300  BP:  130/78    Pulse:      Resp: (!) '23 20 20 15  '$ Temp:      TempSrc:      SpO2:      Weight:      Height:       Constitutional: NAD, AAOx3 HEENT: conjunctivae and lids normal, EOMI CV: No cyanosis.   RESP: trach present SKIN: warm, dry Neuro: II - XII grossly intact.   Psych: Normal mood and affect.   Data Reviewed:  There are no new results to review at this time.  Family Communication: None  Disposition: Status is: Inpatient Remains inpatient appropriate because: Medically stable, waiting for placement  Planned Discharge Destination: Skilled nursing facility   DVT prophylaxis-Eliquis Time spent: 25 minutes  Author: Max Sane, MD 03/20/2022 5:14 PM  For on call review [email or web address].

## 2022-03-20 NOTE — Assessment & Plan Note (Signed)
Estimated body mass index is 50.62 kg/m as calculated from the following:   Height as of this encounter: '6\' 3"'$  (1.905 m).   Weight as of this encounter: 790.3 kg.   -Complicates prognosis

## 2022-03-20 NOTE — Assessment & Plan Note (Signed)
-  continue PPI therapy

## 2022-03-20 NOTE — Assessment & Plan Note (Signed)
Resolved after holding IV Lasix -Avoid nephrotoxins

## 2022-03-20 NOTE — Assessment & Plan Note (Signed)
-   Currently stable on baseline which required intermittent vent support with trach collar.

## 2022-03-20 NOTE — Plan of Care (Signed)
  Problem: Education: Goal: Ability to demonstrate management of disease process will improve Outcome: Progressing Goal: Ability to verbalize understanding of medication therapies will improve Outcome: Progressing   Problem: Activity: Goal: Capacity to carry out activities will improve Outcome: Progressing   Problem: Cardiac: Goal: Ability to achieve and maintain adequate cardiopulmonary perfusion will improve Outcome: Progressing   Problem: Education: Goal: Knowledge of General Education information will improve Description: Including pain rating scale, medication(s)/side effects and non-pharmacologic comfort measures Outcome: Progressing   Problem: Health Behavior/Discharge Planning: Goal: Ability to manage health-related needs will improve Outcome: Progressing   Problem: Clinical Measurements: Goal: Ability to maintain clinical measurements within normal limits will improve Outcome: Progressing Goal: Will remain free from infection Outcome: Progressing Goal: Diagnostic test results will improve Outcome: Progressing Goal: Respiratory complications will improve Outcome: Progressing Goal: Cardiovascular complication will be avoided Outcome: Progressing   Problem: Activity: Goal: Risk for activity intolerance will decrease Outcome: Progressing   Problem: Nutrition: Goal: Adequate nutrition will be maintained Outcome: Progressing   Problem: Coping: Goal: Level of anxiety will decrease Outcome: Progressing   Problem: Elimination: Goal: Will not experience complications related to bowel motility Outcome: Progressing Goal: Will not experience complications related to urinary retention Outcome: Progressing   Problem: Pain Managment: Goal: General experience of comfort will improve Outcome: Progressing   Problem: Safety: Goal: Ability to remain free from injury will improve Outcome: Progressing   Problem: Skin Integrity: Goal: Risk for impaired skin integrity  will decrease Outcome: Progressing Pt verbalizes understanding

## 2022-03-20 NOTE — Progress Notes (Signed)
Occupational Therapy Treatment Patient Details Name: Gary [name] MRN: [id number] DOB: [birth year] Today's Date: 03/20/2022   History of present illness Pt is a 66 y.o. 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  Prolonged admittances earlier this year for similar. MD assessment includes: Acute on chronic diastolic CHF, AKI, and acute on chronic respiratory failure with hypoxia.   OT comments  Gary [name] was seen for OT treatment on this date. Upon arrival to room pt reclined in bed, agreeable to tx. Pt requires MIN A x2 + BRW for x4 bouts of standing from bed. Greatly improved attention to task and technique in standing - completed 2 steps backwards each leg and improved tolerance for weight shifting. Attempted bed>chair trasnfer however pt fatigued, plan to trial next date. OOB to chair using overhead lift. Pt making good progress toward goals, will continue to follow POC. Discharge recommendation remains appropriate.     Recommendations for follow up therapy are one component of a multi-disciplinary discharge planning process, led by the attending physician.  Recommendations may be updated based on patient status, additional functional criteria and insurance authorization.    Follow Up Recommendations  Skilled nursing-short term rehab (<3 hours/day)     Assistance Recommended at Discharge Frequent or constant Supervision/Assistance  Patient can return home with the following  Two people to help with walking and/or transfers;Two people to help with bathing/dressing/bathroom   Equipment Recommendations  Hospital bed    Recommendations for Other Services      Precautions / Restrictions Precautions Precautions: Fall Restrictions Weight Bearing Restrictions: Yes RLE Weight Bearing: Weight bearing as tolerated       Mobility Bed Mobility Overal bed mobility: Needs  Assistance Bed Mobility: Supine to Sit     Supine to sit: Min assist     General bed mobility comments: assistance for LLE . increased time and effort    Transfers Overall transfer level: Needs assistance Equipment used: Rolling walker (2 wheels) (bariatric rolling walker) Transfers: Sit to/from Stand Sit to Stand: Min assist, +2 physical assistance, From elevated surface           General transfer comment: 4 bouts of standing from bed. 2 steps backwards each leg and improved tolerance for weight shifting. Attempted bed>chair trasnfer however pt fatigued, plan to trial next date. OOB to chair using overhead lift     Balance Overall balance assessment: Needs assistance Sitting-balance support: Feet supported Sitting balance-[name] Scale: Good     Standing balance support: Bilateral upper extremity supported Standing balance-[name] Scale: Fair Standing balance comment: heavy use of rolling walker for support in standing                           ADL either performed or assessed with clinical judgement   ADL Overall ADL's : Needs assistance/impaired                                       General ADL Comments: MAX A don B shoes in sitting      Cognition Arousal/Alertness: Awake/alert Behavior During Therapy: WFL for tasks assessed/performed Overall Cognitive Status: Within Functional Limits for tasks assessed  Pertinent Vitals/ Pain       Pain Assessment Pain Assessment: No/denies pain   Frequency  Min 2X/week        Progress Toward Goals  OT Goals(current goals can now be found in the care plan section)  Progress towards OT goals: Progressing toward goals  Acute Rehab OT Goals Patient Stated Goal: go home OT Goal Formulation: With patient Time For Goal Achievement: 04/02/22 Potential to Achieve Goals: Fair ADL Goals Pt Will Perform Grooming: with  modified independence;sitting Pt Will Perform Lower Body Dressing: sitting/lateral leans;with min assist Pt Will Transfer to Toilet: with min assist;bedside commode;stand pivot transfer  Plan Discharge plan remains appropriate;Frequency remains appropriate    Co-evaluation    PT/OT/SLP Co-Evaluation/Treatment: Yes Reason for Co-Treatment: Complexity of the patient's impairments (multi-system involvement) PT goals addressed during session: Mobility/safety with mobility OT goals addressed during session: ADL's and self-care      AM-PAC OT "6 Clicks" Daily Activity     Outcome Measure   Help from another person eating meals?: None Help from another person taking care of personal grooming?: A Little Help from another person toileting, which includes using toliet, bedpan, or urinal?: A Lot Help from another person bathing (including washing, rinsing, drying)?: A Lot Help from another person to put on and taking off regular upper body clothing?: A Little Help from another person to put on and taking off regular lower body clothing?: A Lot 6 Click Score: 16    End of Session    OT Visit Diagnosis: Other abnormalities of gait and mobility (R26.89);Muscle weakness (generalized) (M62.81)   Activity Tolerance Patient tolerated treatment well   Patient Left with call bell/phone within reach;in chair   Nurse Communication Mobility status        Time: 9507-2257 OT Time Calculation (min): 28 min  Charges: OT General Charges $OT Visit: 1 Visit OT Treatments $Self Care/Home Management : 8-22 mins  [name], M.S. OTR/L  03/20/22, 3:25 PM  ascom [phone]

## 2022-03-20 NOTE — TOC Progression Note (Signed)
Transition of Care [facility]) - Progression Note    Patient Details  Name: EDDISON SEARLS MRN: [id number] Date of Birth: [birth year]  [facility] CM/SW Contact  Shelbie Hutching, RN Phone Number: 03/20/2022, 2:22 PM  Clinical Narrative:    [facility] declined to offer a bed.  TOC will consult with leadership on options for alternative discharge plan.    Expected Discharge Plan: [place] Barriers to Discharge: Continued Medical Work up, Ship broker, Unsafe home situation  Expected Discharge Plan and Services Expected Discharge Plan: [place] In-house Referral: Clinical Social Work     Living arrangements for the past 2 months: Single Family Home                                       Social Determinants of Health (SDOH) Interventions    Readmission Risk Interventions    12/31/2021    8:58 AM 12/20/2021    4:21 PM 05/04/2021    1:26 PM  Readmission Risk Prevention Plan  Transportation Screening Complete Complete Complete  Medication Review Press photographer) Complete Complete Complete  PCP or Specialist appointment within 3-5 days of discharge Complete Complete Complete  HRI or Home Care Consult Complete Complete Complete  SW Recovery Care/Counseling Consult Complete Complete Complete  Palliative Care Screening Complete Complete Not [place] Not Applicable Not Applicable Not Applicable

## 2022-03-21 DIAGNOSIS — I5033 Acute on chronic diastolic (congestive) heart failure: Secondary | ICD-10-CM | POA: Diagnosis not present

## 2022-03-21 MED ORDER — POLYETHYLENE GLYCOL 3350 17 G PO PACK
34.0000 g | PACK | Freq: Every day | ORAL | Status: DC
  Administered 2022-03-21 – 2022-03-22 (×2): 34 g via ORAL
  Administered 2022-03-24 – 2022-03-25 (×2): 17 g via ORAL
  Administered 2022-03-26: 34 g via ORAL
  Filled 2022-03-21 (×8): qty 2

## 2022-03-21 NOTE — Progress Notes (Signed)
  Progress Note   Patient: Gary [name] WUJ:[id number] DOB: 05-17-1955 DOA: 01/23/2022     66 DOS: the patient was seen and examined on 03/21/2022   Brief hospital course: ORENTHAL DEBSKI is a 66 y.o. male with medical history significant for diastolic CHF, chronic respiratory failure status post tracheostomy, COPD, essential hypertension, PUD and sleep apnea who presented to the emergency room with acute onset of worsening dyspnea.  Apparently patient has been in the ER for the past 10 days as he was unable to be cared at home specially requirement of tracheostomy care, awaiting placement. He developed worsening pulmonary edema. Being admitted for management of acute on chronic diastolic heart failure and given IV Lasix.  Patient condition has gradually improved.  He was able to tolerate nocturnal trilogy ventilation.  Currently pending nursing placement.   12/3-4: medically stable, waiting for placement  Assessment and Plan: * Acute on chronic diastolic (congestive) heart failure ([name]) Patient to have some peripheral edema and concern of pulmonary vascular congestion on chest x-ray.   - Continue torsemide  - seems well compensated now.  Acute on chronic respiratory failure with hypoxia ([name]) - Currently stable on baseline which required intermittent vent support with trach collar.  AKI (acute kidney injury) ([place]) Resolved after holding IV Lasix -Avoid nephrotoxins  Chronic obstructive pulmonary disease (COPD) ([name]) - Continue bronchodilators and home inhalers  GERD without esophagitis - continue PPI therapy.  Obesity, Class III, BMI 40-49.9 (morbid obesity) ([name]) Estimated body mass index is 50.62 kg/m as calculated from the following:   Height as of this encounter: '6\' 3"'$  (1.905 m).   Weight as of this encounter: 956.2 kg.   -Complicates prognosis  Anxiety and depression - continue Lexapro and Klonopin  Dyslipidemia - continue statin  therapy.  Hypokalemia Repleted  Constipation Says last bm couple days ago - will up-titrate bowel regimen        Subjective: Wanting to go home soon, agreeable to work with therapy, says improving  Physical Exam: Vitals:   03/20/22 2151 03/21/22 0500 03/21/22 0556 03/21/22 0831  BP: 114/71  120/74 120/82  Pulse: 82  73 79  Resp: (!) '29  16 17  '$ Temp: 98 F (36.7 C)  98.4 F (36.9 C) 98 F (36.7 C)  TempSrc: Oral  Oral Oral  SpO2: 91%  92% 94%  Weight:  (!) 190.5 kg    Height:       Constitutional: NAD, AAOx3 HEENT: conjunctivae and lids normal, EOMI CV: No cyanosis.   RESP: trach present SKIN: warm, dry Neuro: non-focal Psych: Normal mood and affect.   Data Reviewed:  There are no new results to review at this time.  Family Communication: None  Disposition: Status is: Inpatient Remains inpatient appropriate because: Medically stable, waiting for placement  Planned Discharge Destination: Skilled nursing facility   DVT prophylaxis-Eliquis    Author: Desma Maxim, MD 03/21/2022 3:16 PM  For on call review [email or web address].

## 2022-03-21 NOTE — Progress Notes (Signed)
Occupational Therapy Treatment Patient Details Name: Gary [name] MRN: [id number] DOB: 09-28-1955 Today's Date: 03/21/2022   History of present illness Pt is a 66 y.o. 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  Prolonged admittances earlier this year for similar. MD assessment includes: Acute on chronic diastolic CHF, AKI, and acute on chronic respiratory failure with hypoxia.   OT comments  Gary [name] was seen for OT treatment on this date, overlapping with PT for safe mobility. Upon arrival to room pt reclined in bed, eager and agreeable to tx. Pt requires MIN A exit bed, good sitting balance. MIN A x2 +BRW bed>chair step pivot t/f from max elevated bed height. Pt making good progress toward goals, will continue to follow POC. Discharge recommendation remains appropriate.     Recommendations for follow up therapy are one component of a multi-disciplinary discharge planning process, led by the attending physician.  Recommendations may be updated based on patient status, additional functional criteria and insurance authorization.    Follow Up Recommendations  Skilled nursing-short term rehab (<3 hours/day)     Assistance Recommended at Discharge Frequent or constant Supervision/Assistance  Patient can return home with the following  Two people to help with walking and/or transfers;Two people to help with bathing/dressing/bathroom   Equipment Recommendations  Hospital bed    Recommendations for Other Services      Precautions / Restrictions Precautions Precautions: Fall Restrictions Weight Bearing Restrictions: No       Mobility Bed Mobility Overal bed mobility: Needs Assistance Bed Mobility: Supine to Sit     Supine to sit: Min assist     General bed mobility comments: assistance for LLE    Transfers Overall transfer level: Needs assistance Equipment  used: Rolling walker (2 wheels) Transfers: Sit to/from Stand, Bed to chair/wheelchair/BSC Sit to Stand: Min assist, +2 physical assistance, From elevated surface     Step pivot transfers: Min assist, From elevated surface, +2 safety/equipment     General transfer comment: assist to manage RW and step by step cues     Balance Overall balance assessment: Needs assistance Sitting-balance support: Feet supported Sitting balance-[name] Scale: Good     Standing balance support: Bilateral upper extremity supported, Reliant on assistive device for balance Standing balance-[name] Scale: Fair                             ADL either performed or assessed with clinical judgement   ADL Overall ADL's : Needs assistance/impaired                                       General ADL Comments: MAX A don B shoes in sitting. MIN A x2 + RW for simulated BSC t/f      Cognition Arousal/Alertness: Awake/alert Behavior During Therapy: WFL for tasks assessed/performed Overall Cognitive Status: Within Functional Limits for tasks assessed                                                     Pertinent Vitals/ Pain       Pain Assessment Pain Assessment: No/denies pain  Frequency  Min 2X/week        Progress Toward Goals  OT Goals(current goals can now be found in the care plan section)  Progress towards OT goals: Progressing toward goals  Acute Rehab OT Goals Patient Stated Goal: to walk OT Goal Formulation: With patient Time For Goal Achievement: 04/02/22 Potential to Achieve Goals: Fair ADL Goals Pt Will Perform Grooming: with modified independence;sitting Pt Will Perform Lower Body Dressing: sitting/lateral leans;with min assist Pt Will Transfer to Toilet: with min assist;bedside commode;stand pivot transfer  Plan Discharge plan remains appropriate;Frequency remains appropriate    Co-evaluation                 AM-PAC OT "6  Clicks" Daily Activity     Outcome Measure   Help from another person eating meals?: None Help from another person taking care of personal grooming?: A Little Help from another person toileting, which includes using toliet, bedpan, or urinal?: A Lot Help from another person bathing (including washing, rinsing, drying)?: A Lot Help from another person to put on and taking off regular upper body clothing?: A Little Help from another person to put on and taking off regular lower body clothing?: A Lot 6 Click Score: 16    End of Session    OT Visit Diagnosis: Other abnormalities of gait and mobility (R26.89);Muscle weakness (generalized) (M62.81)   Activity Tolerance Patient tolerated treatment well   Patient Left with call bell/phone within reach;in chair   Nurse Communication Mobility status        Time: 1355-1410 OT Time Calculation (min): 15 min  Charges: OT General Charges $OT Visit: 1 Visit OT Treatments $Self Care/Home Management : 8-22 mins  [name], M.S. OTR/L  03/21/22, 3:12 PM  ascom [phone]

## 2022-03-21 NOTE — Progress Notes (Signed)
Physical Therapy Treatment Patient Details Name: Gary [name] MRN: [id number] DOB: Jul 19, 1955 Today's Date: 03/21/2022   History of Present Illness Pt is a 66 y.o. 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  Prolonged admittances earlier this year for similar. MD assessment includes: Acute on chronic diastolic CHF, AKI, and acute on chronic respiratory failure with hypoxia.    PT Comments    Pt was sitting EOB with OT upon arriving. He is A and O and agreeable to session. In good spirits throughout session and motivated to attempt OOB to recliner. He stood and took steps (clearing both R/L LE)  however once fatigued, struggled to progress/lift LEs with taking steps. Pt endorses fatigue but overall tolerated well. After getting into recliner, performed seated there ex in bed to  strengthening BLEs. Overall pt is progressing. Wants to attempt standing from lower recliner surface and ambulate greater distances next session. Will continue to follow and progress as able per current POC.     Recommendations for follow up therapy are one component of a multi-disciplinary discharge planning process, led by the attending physician.  Recommendations may be updated based on patient status, additional functional criteria and insurance authorization.  Follow Up Recommendations  Skilled nursing-short term rehab (<3 hours/day)     Assistance Recommended at Discharge Frequent or constant Supervision/Assistance  Patient can return home with the following Two people to help with walking and/or transfers;Two people to help with bathing/dressing/bathroom;Assistance with cooking/housework;Assist for transportation;Help with stairs or ramp for entrance   Equipment Recommendations  None recommended by PT       Precautions / Restrictions Precautions Precautions: Fall Precaution Comments: cuffed  trach/ trach collar Restrictions Weight Bearing Restrictions: No     Mobility  Bed Mobility  General bed mobility comments: seated EOB upon arrival and sitting in recliner post session    Transfers Overall transfer level: Needs assistance Equipment used: Rolling walker (2 wheels) Transfers: Sit to/from Stand, Bed to chair/wheelchair/BSC Sit to Stand: Min assist, +2 physical assistance, From elevated surface General transfer comment: +2 assistance for safety however pt really CGA +2. He was able to take several steps from EOB to recliner.    Ambulation/Gait Ambulation/Gait assistance: Min guard, +2 safety/equipment, +2 physical assistance Gait Distance (Feet): 5 Feet Assistive device: Rolling walker (2 wheels) Gait velocity: decreased   General Gait Details: pt did clear feet bilaterally to take steps from EOB to recliner. Very slow step to pattern however took ~ 5 steps .    Balance Overall balance assessment: Needs assistance Sitting-balance support: Feet supported Sitting balance-[name] Scale: Good     Standing balance support: Bilateral upper extremity supported, Reliant on assistive device for balance Standing balance-[name] Scale: Fair     Cognition Arousal/Alertness: Awake/alert Behavior During Therapy: WFL for tasks assessed/performed Overall Cognitive Status: Within Functional Limits for tasks assessed      General Comments: Pt was A and O x 4. Agrees to session. sitting EOB with OT whn author arrived. PT/OT co-treat overlaped           General Comments General comments (skin integrity, edema, etc.): After getting from EOB into recliner, performed and tolerated ~ 10 minutes of ther ex sitting in recliner.      Pertinent Vitals/Pain Pain Assessment Pain Assessment: No/denies pain     PT Goals (current goals can now be found in the care plan section) Acute  Rehab PT Goals Patient Stated Goal: to walk again Progress towards PT goals: Progressing toward  goals    Frequency    Min 2X/week      PT Plan Current plan remains appropriate    Co-evaluation     PT goals addressed during session: Mobility/safety with mobility;Strengthening/ROM        AM-PAC PT "6 Clicks" Mobility   Outcome Measure  Help needed turning from your back to your side while in a flat bed without using bedrails?: A Lot Help needed moving from lying on your back to sitting on the side of a flat bed without using bedrails?: A Lot Help needed moving to and from a bed to a chair (including a wheelchair)?: A Lot Help needed standing up from a chair using your arms (e.g., wheelchair or bedside chair)?: A Lot Help needed to walk in hospital room?: A Lot Help needed climbing 3-5 steps with a railing? : Total 6 Click Score: 11    End of Session Equipment Utilized During Treatment: Oxygen (trach collar) Activity Tolerance: Patient tolerated treatment well Patient left: in chair;with call bell/phone within reach;Other (comment) (hoyer pad under pt) Nurse Communication: Mobility status PT Visit Diagnosis: Unsteadiness on feet (R26.81);Muscle weakness (generalized) (M62.81);Difficulty in walking, not elsewhere classified (R26.2)     Time: 6468-0321 PT Time Calculation (min) (ACUTE ONLY): 12 min  Charges:  $Therapeutic Activity: 8-22 mins                     Julaine Fusi PTA 03/21/22, 4:30 PM

## 2022-03-22 DIAGNOSIS — I5033 Acute on chronic diastolic (congestive) heart failure: Secondary | ICD-10-CM | POA: Diagnosis not present

## 2022-03-22 LAB — BASIC METABOLIC PANEL
Anion gap: 7 (ref 5–15)
BUN: 26 mg/dL — ABNORMAL HIGH (ref 8–23)
CO2: 30 mmol/L (ref 22–32)
Calcium: 8.3 mg/dL — ABNORMAL LOW (ref 8.9–10.3)
Chloride: 103 mmol/L (ref 98–111)
Creatinine, Ser: 1.19 mg/dL (ref 0.61–1.24)
GFR, Estimated: >60 mL/min (ref >60)
Glucose, Bld: 142 mg/dL — ABNORMAL HIGH (ref 70–99)
Potassium: 4.2 mmol/L (ref 3.5–5.1)
Sodium: 140 mmol/L (ref 135–145)

## 2022-03-22 MED ORDER — GABAPENTIN 300 MG PO CAPS
300.0000 mg | ORAL_CAPSULE | Freq: Three times a day (TID) | ORAL | Status: DC
  Administered 2022-03-22 – 2022-03-27 (×16): 300 mg via ORAL
  Filled 2022-03-22 (×21): qty 1

## 2022-03-22 NOTE — Progress Notes (Signed)
  Progress Note   Patient: Gary [name] HLK:[id number] DOB: 24-Apr-1955 DOA: 01/23/2022     49 DOS: the patient was seen and examined on 03/22/2022   Brief hospital course: Gary [name] is a 66 y.o. male with medical history significant for diastolic CHF, chronic respiratory failure status post tracheostomy, COPD, essential hypertension, PUD and sleep apnea who presented to the emergency room with acute onset of worsening dyspnea.  Apparently patient has been in the ER for the past 10 days as he was unable to be cared at home specially requirement of tracheostomy care, awaiting placement. He developed worsening pulmonary edema. Being admitted for management of acute on chronic diastolic heart failure and given IV Lasix.  Patient condition has gradually improved.  He was able to tolerate nocturnal trilogy ventilation.  Currently pending nursing placement.   12/3-4: medically stable, waiting for placement  Assessment and Plan: * Acute on chronic diastolic (congestive) heart failure ([name]) Patient to have some peripheral edema and concern of pulmonary vascular congestion on chest x-ray.   - Continue torsemide  - seems well compensated now.  Acute on chronic respiratory failure with hypoxia ([name]) - Currently stable on baseline which required intermittent vent support with trach collar.  AKI (acute kidney injury) ([place]) Resolved after holding IV Lasix -Avoid nephrotoxins  Chronic obstructive pulmonary disease (COPD) ([name]) - Continue bronchodilators and home inhalers  GERD without esophagitis - continue PPI therapy.  Obesity, Class III, BMI 40-49.9 (morbid obesity) ([name]) Estimated body mass index is 50.62 kg/m as calculated from the following:   Height as of this encounter: '6\' 3"'$  (1.905 m).   Weight as of this encounter: 734.2 kg.  -Complicates prognosis  Anxiety and depression - continue Lexapro and Klonopin  Dyslipidemia - continue statin  therapy.  Hypokalemia Repleted  Constipation resolved        Subjective: up with pt today, feeling fine  Physical Exam: Vitals:   03/21/22 2255 03/22/22 0339 03/22/22 0500 03/22/22 0750  BP:  110/75  126/87  Pulse: 79 71    Resp: 16 13    Temp:  98.4 F (36.9 C)  98 F (36.7 C)  TempSrc:  Oral    SpO2: 97% 94%  95%  Weight:   (!) 191 kg   Height:       Constitutional: NAD, AAOx3 HEENT: conjunctivae and lids normal, EOMI CV: No cyanosis.   RESP: trach present SKIN: warm, dry Neuro: non-focal Psych: Normal mood and affect.   Data Reviewed:  There are no new results to review at this time.  Family Communication: None  Disposition: Status is: Inpatient Remains inpatient appropriate because: Medically stable, waiting for placement  Planned Discharge Destination: Skilled nursing facility   DVT prophylaxis-Eliquis    Author: Desma Maxim, MD 03/22/2022 1:34 PM  For on call review [email or web address].

## 2022-03-22 NOTE — TOC Progression Note (Signed)
Transition of Care [facility]) - Progression Note    Patient Details  Name: Gary [name] MRN: [id number] Date of Birth: 1955/11/20  Transition of Care [facility]) CM/SW Contact  Gary Hutching, RN Phone Number: 03/22/2022, 9:04 AM  Clinical Narrative:    Received a call from Gary [name] with [facility] Medicaid yesterday.  Gary [name] was calling to check on the status of discharge planning with patient.  Currently patient has no bed offers for SNF.  Gary [name] will work on getting PCS set up for patient to go home.  She will also arrange his transportation home.  His case worker with [employer] is [employer].  Sherry's number is [phone], she needs to know the date of discharge to start the PCS.  Patient did take steps and sit in the chair yesterday with therapy.    Expected Discharge Plan: [place] Barriers to Discharge: Continued Medical Work up, Ship broker, Unsafe home situation  Expected Discharge Plan and Services Expected Discharge Plan: [place] In-house Referral: Clinical Social Work     Living arrangements for the past 2 months: Single Family Home                                       Social Determinants of Health (SDOH) Interventions    Readmission Risk Interventions    12/31/2021    8:58 AM 12/20/2021    4:21 PM 05/04/2021    1:26 PM  Readmission Risk Prevention Plan  Transportation Screening Complete Complete Complete  Medication Review Press photographer) Complete Complete Complete  PCP or Specialist appointment within 3-5 days of discharge Complete Complete Complete  HRI or Home Care Consult Complete Complete Complete  SW Recovery Care/Counseling Consult Complete Complete Complete  Palliative Care Screening Complete Complete Not [place] Not Applicable Not Applicable Not Applicable

## 2022-03-22 NOTE — Progress Notes (Signed)
Per Dr. Si Raider this am, discontinue EKG. Continue to assess.

## 2022-03-22 NOTE — Progress Notes (Signed)
Physical Therapy Treatment Patient Details Name: Gary [name] MRN: [id number] DOB: 12-Nov-1955 Today's Date: 03/22/2022   History of Present Illness Pt is a 66 y.o. 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  Prolonged admittances earlier this year for similar. MD assessment includes: Acute on chronic diastolic CHF, AKI, and acute on chronic respiratory failure with hypoxia.    PT Comments    Pt continues to be cooperative and motivated however did better previous date versus today. Pt was able to stand several times EOB and did take a few steps however fatigued quickly and required seated rest. Pt is progressing but slowly. Will continue efforts to progress pt towards all acute goals as able. SNF most appropriate DC disposition however if unable, will need 24/7 assistance at home.    Recommendations for follow up therapy are one component of a multi-disciplinary discharge planning process, led by the attending physician.  Recommendations may be updated based on patient status, additional functional criteria and insurance authorization.  Follow Up Recommendations  Skilled nursing-short term rehab (<3 hours/day) Can patient physically be transported by private vehicle: No   Assistance Recommended at Discharge Frequent or constant Supervision/Assistance  Patient can return home with the following Two people to help with walking and/or transfers;Two people to help with bathing/dressing/bathroom;Assistance with cooking/housework;Assist for transportation;Help with stairs or ramp for entrance   Equipment Recommendations  None recommended by PT       Precautions / Restrictions Precautions Precautions: Fall Precaution Comments: cuffed trach/ trach collar Restrictions Weight Bearing Restrictions: No     Mobility  Bed Mobility Overal bed mobility: Needs Assistance Bed  Mobility: Supine to Sit Rolling: Min assist   Supine to sit: Min assist Sit to supine: Max assist        Transfers Overall transfer level: Needs assistance Equipment used: Rolling walker (2 wheels) Transfers: Sit to/from Stand Sit to Stand: Min assist, +2 physical assistance, From elevated surface           General transfer comment: pt stood several times EOB hoever continues to have severe fear of falling fwd in which is slowing progression with PT. pt was able to take ~ 3-4 steps away form EOB however required bed to be placed behind him 2/2 to fear/fatigue.    Ambulation/Gait   Gait Distance (Feet): 3 Feet       Cognition Arousal/Alertness: Awake/alert Behavior During Therapy: WFL for tasks assessed/performed Overall Cognitive Status: Within Functional Limits for tasks assessed Area of Impairment: Safety/judgement, Problem solving  General Comments: poor insight into safe sitting balance  - pt fearful of falling               Pertinent Vitals/Pain Pain Assessment Pain Assessment: No/denies pain     PT Goals (current goals can now be found in the care plan section) Acute Rehab PT Goals Patient Stated Goal: go home by christmas Progress towards PT goals: Progressing toward goals    Frequency    Min 2X/week      PT Plan Current plan remains appropriate    Co-evaluation   Reason for Co-Treatment: To address functional/ADL transfers PT goals addressed during session: Mobility/safety with mobility;Balance;Proper use of DME;Strengthening/ROM OT goals addressed during session: ADL's and self-care      AM-PAC PT "6 Clicks" Mobility   Outcome Measure  Help needed turning from your back to your side while in a  flat bed without using bedrails?: A Lot Help needed moving from lying on your back to sitting on the side of a flat bed without using bedrails?: A Lot Help needed moving to and from a bed to a chair (including a wheelchair)?: A Lot Help needed  standing up from a chair using your arms (e.g., wheelchair or bedside chair)?: A Lot Help needed to walk in hospital room?: A Lot Help needed climbing 3-5 steps with a railing? : Total 6 Click Score: 11    End of Session   Activity Tolerance: Patient tolerated treatment well Patient left: in chair;with call bell/phone within reach;Other (comment) Nurse Communication: Mobility status PT Visit Diagnosis: Unsteadiness on feet (R26.81);Muscle weakness (generalized) (M62.81);Difficulty in walking, not elsewhere classified (R26.2)     Time: 0092-3300 PT Time Calculation (min) (ACUTE ONLY): 36 min  Charges:  $Therapeutic Activity: 8-22 mins                    Julaine Fusi PTA 03/22/22, 4:49 PM

## 2022-03-22 NOTE — Progress Notes (Signed)
Occupational Therapy Treatment Patient Details Name: Gary [name] MRN: [id number] DOB: Jun 16, 1955 Today's Date: 03/22/2022   History of present illness Pt is a 66 y.o. 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  Prolonged admittances earlier this year for similar. MD assessment includes: Acute on chronic diastolic CHF, AKI, and acute on chronic respiratory failure with hypoxia.   OT comments  Gary [name] was seen for OT/PT co- treatment on this date. Upon arrival to room pt seated EOB with PT, agreeable to tx. Pt requires MIN A x2 + RW sit<>stand and ~3 steps forward from EOB, pt becomes fearful and requests to sit, bed brought up behind and locked prior to sitting. Demonstrates good sitting balance however pt becomes fearful of falling and leans backwards requiring +2 for safe sitting balance. Second standing trial with poor tolerance, <30 sec. Lateral leans for lift pad placement in sitting, left in chair. Pt making good progress toward goals, will continue to follow POC. Discharge recommendation remains appropriate.     Recommendations for follow up therapy are one component of a multi-disciplinary discharge planning process, led by the attending physician.  Recommendations may be updated based on patient status, additional functional criteria and insurance authorization.    Follow Up Recommendations  Skilled nursing-short term rehab (<3 hours/day)     Assistance Recommended at Discharge Frequent or constant Supervision/Assistance  Patient can return home with the following  Two people to help with walking and/or transfers;Two people to help with bathing/dressing/bathroom   Equipment Recommendations  Hospital bed    Recommendations for Other Services      Precautions / Restrictions Precautions Precautions: Fall Restrictions Weight Bearing Restrictions: No        Mobility Bed Mobility               General bed mobility comments: received sitting EOB with PT    Transfers Overall transfer level: Needs assistance Equipment used: Rolling walker (2 wheels) Transfers: Sit to/from Stand Sit to Stand: Min assist, +2 physical assistance, From elevated surface                 Balance Overall balance assessment: Needs assistance Sitting-balance support: Feet supported Sitting balance-[name] Scale: Fair Sitting balance - Comments: pt becomes fearful of falling and leans backwards requiring +2 for safe sitting balance Postural control: Posterior lean Standing balance support: Bilateral upper extremity supported, Reliant on assistive device for balance Standing balance-[name] Scale: Fair                             ADL either performed or assessed with clinical judgement   ADL Overall ADL's : Needs assistance/impaired                                       General ADL Comments: MAX A don B shoes in sitting. MIN A x2 + RW for simulated BSC t/f      Cognition Arousal/Alertness: Awake/alert Behavior During Therapy: WFL for tasks assessed/performed Overall Cognitive Status: Within Functional Limits for tasks assessed Area of Impairment: Safety/judgement, Problem solving                         Safety/Judgement: Decreased awareness of safety, Decreased awareness of  deficits   Problem Solving: Requires verbal cues General Comments: poor insight into safe sitting balance  - pt fearful of falling                   Pertinent Vitals/ Pain       Pain Assessment Pain Assessment: No/denies pain   Frequency  Min 2X/week        Progress Toward Goals  OT Goals(current goals can now be found in the care plan section)  Progress towards OT goals: Progressing toward goals  Acute Rehab OT Goals Patient Stated Goal: walk OT Goal Formulation: With patient Time For Goal Achievement:  04/02/22 Potential to Achieve Goals: Fair ADL Goals Pt Will Perform Grooming: with modified independence;sitting Pt Will Perform Lower Body Dressing: sitting/lateral leans;with min assist Pt Will Transfer to Toilet: with min assist;bedside commode;stand pivot transfer  Plan Discharge plan remains appropriate;Frequency remains appropriate    Co-evaluation    PT/OT/SLP Co-Evaluation/Treatment: Yes Reason for Co-Treatment: To address functional/ADL transfers PT goals addressed during session: Mobility/safety with mobility OT goals addressed during session: ADL's and self-care      AM-PAC OT "6 Clicks" Daily Activity     Outcome Measure   Help from another person eating meals?: None Help from another person taking care of personal grooming?: A Little Help from another person toileting, which includes using toliet, bedpan, or urinal?: A Lot Help from another person bathing (including washing, rinsing, drying)?: A Lot Help from another person to put on and taking off regular upper body clothing?: A Little Help from another person to put on and taking off regular lower body clothing?: A Lot 6 Click Score: 16    End of Session Equipment Utilized During Treatment: Rolling walker (2 wheels)  OT Visit Diagnosis: Other abnormalities of gait and mobility (R26.89);Muscle weakness (generalized) (M62.81)   Activity Tolerance Patient tolerated treatment well   Patient Left with call bell/phone within reach;in chair   Nurse Communication Mobility status        Time: 2202-5427 OT Time Calculation (min): 25 min  Charges: OT General Charges $OT Visit: 1 Visit OT Treatments $Therapeutic Activity: 23-37 mins  [name], M.S. OTR/L  03/22/22, 3:40 PM  ascom [phone]

## 2022-03-22 NOTE — Progress Notes (Signed)
When moving patient from chair to bed in lift, he had some drops of pink red blood on chucks. Wiped his anus, no blood. Checked between fold no signs of blood. Dr. Randol Kern messaged third shift nurse notified.

## 2022-03-23 ENCOUNTER — Ambulatory Visit: Payer: Medicaid Other | Admitting: Cardiology

## 2022-03-23 DIAGNOSIS — I5033 Acute on chronic diastolic (congestive) heart failure: Secondary | ICD-10-CM | POA: Diagnosis not present

## 2022-03-23 NOTE — Progress Notes (Signed)
Occupational Therapy Treatment Patient Details Name: Gary [name] MRN: [id number] DOB: 08-07-1955 Today's Date: 03/23/2022   History of present illness Pt is a 66 y.o. 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  Prolonged admittances earlier this year for similar. MD assessment includes: Acute on chronic diastolic CHF, AKI, and acute on chronic respiratory failure with hypoxia.   OT comments  Gary [name] was seen for OT/PT co-treatment on this date. Upon arrival to room pt reclined in bed, agreeable to tx. Pt agreeable to trial use of parallel bars. MIN A rolling L+R in bed for sling placement, assisted to bari transport chair. Pt placed in parallel bars and completed anterior weight shifting with greatly improved technique. However pt unwilling to trial standing from chair height. Returned to room and left in chair. Pt making progress toward goals, will continue to follow POC. Discharge recommendation remains appropriate.     Recommendations for follow up therapy are one component of a multi-disciplinary discharge planning process, led by the attending physician.  Recommendations may be updated based on patient status, additional functional criteria and insurance authorization.    Follow Up Recommendations  Skilled nursing-short term rehab (<3 hours/day)     Assistance Recommended at Discharge Frequent or constant Supervision/Assistance  Patient can return home with the following  Two people to help with walking and/or transfers;Two people to help with bathing/dressing/bathroom   Equipment Recommendations  Hospital bed    Recommendations for Other Services      Precautions / Restrictions Precautions Precautions: Fall Restrictions Weight Bearing Restrictions: No       Mobility Bed Mobility Overal bed mobility: Needs Assistance Bed Mobility: Rolling Rolling: Min  assist              Transfers                     Transfer via Lift Equipment: [place]   Balance Overall balance assessment: Needs assistance Sitting-balance support: Feet supported Sitting balance-[name] Scale: Fair                                     ADL either performed or assessed with clinical judgement   ADL Overall ADL's : Needs assistance/impaired                                       General ADL Comments: MAX A don B socks seated in chair      Cognition Arousal/Alertness: Awake/alert Behavior During Therapy: WFL for tasks assessed/performed Overall Cognitive Status: Within Functional Limits for tasks assessed Area of Impairment: Safety/judgement, Problem solving                       Following Commands: Follows one step commands consistently Safety/Judgement: Decreased awareness of safety, Decreased awareness of deficits   Problem Solving: Requires verbal cues General Comments: limited by fearful of falling        Exercises Other Exercises Other Exercises: Focused on anterior weight shifting with use of parallel bars.     Pertinent Vitals/ Pain       Pain Assessment Pain Assessment: No/denies pain   Frequency  Min 2X/week        Progress  Toward Goals  OT Goals(current goals can now be found in the care plan section)  Progress towards OT goals: Progressing toward goals  Acute Rehab OT Goals Patient Stated Goal: walk OT Goal Formulation: With patient Time For Goal Achievement: 04/02/22 Potential to Achieve Goals: Fair ADL Goals Pt Will Perform Grooming: with modified independence;sitting Pt Will Perform Lower Body Dressing: sitting/lateral leans;with min assist Pt Will Transfer to Toilet: with min assist;bedside commode;stand pivot transfer  Plan Discharge plan remains appropriate;Frequency remains appropriate    Co-evaluation    PT/OT/SLP Co-Evaluation/Treatment: Yes Reason for  Co-Treatment: For patient/therapist safety;To address functional/ADL transfers PT goals addressed during session: Mobility/safety with mobility OT goals addressed during session: ADL's and self-care      AM-PAC OT "6 Clicks" Daily Activity     Outcome Measure   Help from another person eating meals?: None Help from another person taking care of personal grooming?: A Little Help from another person toileting, which includes using toliet, bedpan, or urinal?: A Lot Help from another person bathing (including washing, rinsing, drying)?: A Lot Help from another person to put on and taking off regular upper body clothing?: A Little Help from another person to put on and taking off regular lower body clothing?: A Lot 6 Click Score: 16    End of Session    OT Visit Diagnosis: Other abnormalities of gait and mobility (R26.89);Muscle weakness (generalized) (M62.81)   Activity Tolerance Other (comment) (self-limiting)   Patient Left with call bell/phone within reach;in chair   Nurse Communication          Time: 1100-1143 OT Time Calculation (min): 43 min  Charges: OT General Charges $OT Visit: 1 Visit OT Treatments $Self Care/Home Management : 8-22 mins  [name], M.S. OTR/L  03/23/22, 1:06 PM  ascom [phone]

## 2022-03-23 NOTE — Progress Notes (Signed)
Physical Therapy Treatment Patient Details Name: Gary [name] MRN: [id number] DOB: Aug 14, 1955 Today's Date: 03/23/2022   History of Present Illness Pt is a 66 y.o. 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  Prolonged admittances earlier this year for similar. MD assessment includes: Acute on chronic diastolic CHF, AKI, and acute on chronic respiratory failure with hypoxia.    PT Comments    Patient brought to the outpatient gym in hopes to use the parallel bars for transfer training, pre-gait activity, and gait training. Patient did not actually attempt to stand in the parallel bars due to the chair height being lower than the bed and fear of being unable to stand. Focused on anterior weight shifting, foot placement, hand placement, in preparation for standing attempts. Patient also able to scoot hips posteriorly in the chair with cues for weight shifting with maximal assistance from therapist. Patient seated in the recliner chair in the room at end of session with encouragement to perform seated LE exercises, emphasis on LAQ, in preparation for standing from a lower height. PT will continue to follow.    Recommendations for follow up therapy are one component of a multi-disciplinary discharge planning process, led by the attending physician.  Recommendations may be updated based on patient status, additional functional criteria and insurance authorization.  Follow Up Recommendations  Skilled nursing-short term rehab (<3 hours/day) Can patient physically be transported by private vehicle: No   Assistance Recommended at Discharge Frequent or constant Supervision/Assistance  Patient can return home with the following Two people to help with walking and/or transfers;Two people to help with bathing/dressing/bathroom;Assistance with cooking/housework;Assist for transportation;Help  with stairs or ramp for entrance   Equipment Recommendations  None recommended by PT    Recommendations for Other Services       Precautions / Restrictions Precautions Precautions: Fall Restrictions Weight Bearing Restrictions: No     Mobility  Bed Mobility                    Transfers                   General transfer comment: hoyer lift used to get patient from bed to transport chair  to recliner chair. encouraged patient to sit up as able, perform LAQ for strengthening. with cues for weight shifting from side to side, patient was able to scoot hips posteriorly in the chair with Max A with therapist using draw pad to facilitate movement. Transfer via [facility] (Stroke Patients Only)       Balance Overall balance assessment: Needs assistance Sitting-balance support: Feet supported Sitting balance-[name] Scale: Fair Sitting balance - Comments: in the parallel bars, patient required faciliation for anterior weight shifting in preparation for standing. several bouts of facilitation provided                                    Cognition Arousal/Alertness: Awake/alert Behavior During Therapy: WFL for tasks assessed/performed Overall Cognitive Status: Within Functional Limits for tasks assessed Area of Impairment: Safety/judgement, Problem solving  Following Commands: Follows one step commands consistently Safety/Judgement: Decreased awareness of safety, Decreased awareness of deficits   Problem Solving: Requires verbal cues General Comments: needs motivation and reassurance at times        Exercises      General Comments General comments (skin integrity, edema, etc.): patient brought to the outpatient gym to the parallel bars in attempts to have patient stand/ambulate with added  support. patient performed several bouts of anterior weight shifting with education on hand placement, foot placement, technique to facilitate standing. patient fearful and unwilling to attempt standing from the transport chair as the seat height does not elevate like the bed function.      Pertinent Vitals/Pain Pain Assessment Pain Assessment: No/denies pain    Home Living                          Prior Function            PT Goals (current goals can now be found in the care plan section) Acute Rehab PT Goals Patient Stated Goal: go home by christmas PT Goal Formulation: With patient Time For Goal Achievement: 04/06/22 Potential to Achieve Goals: Fair Progress towards PT goals: Progressing toward goals    Frequency    Min 2X/week      PT Plan Current plan remains appropriate (will extend care plan and goals are appropriate)    Co-evaluation   Reason for Co-Treatment: Complexity of the patient's impairments (multi-system involvement);To address functional/ADL transfers PT goals addressed during session: Mobility/safety with mobility OT goals addressed during session: ADL's and self-care      AM-PAC PT "6 Clicks" Mobility   Outcome Measure  Help needed turning from your back to your side while in a flat bed without using bedrails?: A Lot Help needed moving from lying on your back to sitting on the side of a flat bed without using bedrails?: A Lot Help needed moving to and from a bed to a chair (including a wheelchair)?: A Lot Help needed standing up from a chair using your arms (e.g., wheelchair or bedside chair)?: A Lot Help needed to walk in hospital room?: A Lot Help needed climbing 3-5 steps with a railing? : Total 6 Click Score: 11    End of Session   Activity Tolerance: Patient tolerated treatment well Patient left: in chair;with call bell/phone within reach Nurse Communication: Mobility status PT Visit Diagnosis: Unsteadiness on feet  (R26.81);Muscle weakness (generalized) (M62.81);Difficulty in walking, not elsewhere classified (R26.2)     Time: 9741-6384 PT Time Calculation (min) (ACUTE ONLY): 38 min  Charges:  $Therapeutic Activity: 23-37 mins                     Gary [name], PT, MPT    Gary [name] 03/23/2022, 1:52 PM

## 2022-03-23 NOTE — Progress Notes (Addendum)
Pt refusing Q8 Vitals and assessments  Focused visual assessments/ Hourly rounds performed to ensure pt safety  Provider notified

## 2022-03-23 NOTE — Progress Notes (Signed)
  Progress Note   Patient: Gary [name] VOH:[id number] DOB: Feb 07, 1956 DOA: 01/23/2022     66 DOS: the patient was seen and examined on 03/23/2022   Brief hospital course: Gary [name] is a 66 y.o. male with medical history significant for diastolic CHF, chronic respiratory failure status post tracheostomy, COPD, essential hypertension, PUD and sleep apnea who presented to the emergency room with acute onset of worsening dyspnea.  Apparently patient has been in the ER for the past 10 days as he was unable to be cared at home specially requirement of tracheostomy care, awaiting placement. He developed worsening pulmonary edema. Being admitted for management of acute on chronic diastolic heart failure and given IV Lasix.  Patient condition has gradually improved.  He was able to tolerate nocturnal trilogy ventilation.  Currently pending nursing placement.   12/3-4: medically stable, waiting for placement  Assessment and Plan: * Acute on chronic diastolic (congestive) heart failure ([name]) Patient to have some peripheral edema and concern of pulmonary vascular congestion on chest x-ray.   - Continue torsemide  - seems well compensated now.  Acute on chronic respiratory failure with hypoxia ([name]) - Currently stable on baseline which required intermittent vent support with trach collar.  AKI (acute kidney injury) ([place]) Resolved after holding IV Lasix -Avoid nephrotoxins  Chronic obstructive pulmonary disease (COPD) ([name]) - Continue bronchodilators and home inhalers  GERD without esophagitis - continue PPI therapy.  Obesity, Class III, BMI 40-49.9 (morbid obesity) ([name]) Estimated body mass index is 50.62 kg/m as calculated from the following:   Height as of this encounter: '6\' 3"'$  (1.905 m).   Weight as of this encounter: 694.8 kg.  -Complicates prognosis  Anxiety and depression - continue Lexapro and Klonopin  Dyslipidemia - continue statin  therapy.  Hypokalemia Repleted  Constipation resolved        Subjective: up with pt today, feeling fine, breathing stable, tolerating diet  Physical Exam: Vitals:   03/22/22 0750 03/22/22 1350 03/23/22 0500 03/23/22 0712  BP: 126/87 113/74  107/73  Pulse:      Resp:  18  19  Temp: 98 F (36.7 C) 98.3 F (36.8 C)  98 F (36.7 C)  TempSrc:      SpO2: 95% 95%  96%  Weight:   (!) 190.9 kg   Height:       Constitutional: NAD, AAOx3 HEENT: conjunctivae and lids normal, EOMI CV: No cyanosis.   RESP: trach present SKIN: warm, dry Neuro: non-focal Psych: Normal mood and affect.   Data Reviewed:  There are no new results to review at this time.  Family Communication: None  Disposition: Status is: Inpatient Remains inpatient appropriate because: Medically stable, waiting for placement  Planned Discharge Destination: Skilled nursing facility   DVT prophylaxis-Eliquis    Author: Desma Maxim, MD 03/23/2022 2:00 PM  For on call review [email or web address].

## 2022-03-24 DIAGNOSIS — I5033 Acute on chronic diastolic (congestive) heart failure: Secondary | ICD-10-CM | POA: Diagnosis not present

## 2022-03-24 NOTE — Progress Notes (Signed)
  Progress Note   Patient: Gary [name] OIN:[id number] DOB: 1956/01/02 DOA: 01/23/2022     51 DOS: the patient was seen and examined on 03/24/2022   Brief hospital course: AZAI GAFFIN is a 66 y.o. male with medical history significant for diastolic CHF, chronic respiratory failure status post tracheostomy, COPD, essential hypertension, PUD and sleep apnea who presented to the emergency room with acute onset of worsening dyspnea.  Apparently patient has been in the ER for the past 10 days as he was unable to be cared at home specially requirement of tracheostomy care, awaiting placement. He developed worsening pulmonary edema. Being admitted for management of acute on chronic diastolic heart failure and given IV Lasix.  Patient condition has gradually improved.  He was able to tolerate nocturnal trilogy ventilation.  Currently pending nursing placement.   12/3-4: medically stable, waiting for placement  Assessment and Plan: * Acute on chronic diastolic (congestive) heart failure ([name]) Patient to have some peripheral edema and concern of pulmonary vascular congestion on chest x-ray.   - Continue torsemide  - seems well compensated now.  Acute on chronic respiratory failure with hypoxia ([name]) - Currently stable on baseline which required intermittent vent support with trach collar.  AKI (acute kidney injury) ([place]) Resolved after holding IV Lasix -Avoid nephrotoxins  Chronic obstructive pulmonary disease (COPD) ([name]) - Continue bronchodilators and home inhalers  GERD without esophagitis - continue PPI therapy.  Obesity, Class III, BMI 40-49.9 (morbid obesity) ([name]) Estimated body mass index is 50.62 kg/m as calculated from the following:   Height as of this encounter: '6\' 3"'$  (1.905 m).   Weight as of this encounter: 709.6 kg.  -Complicates prognosis  Anxiety and depression - continue Lexapro and Klonopin  Dyslipidemia - continue statin  therapy.  Hypokalemia Repleted  Constipation resolved        Subjective: feeling fine, breathing stable, tolerating diet  Physical Exam: Vitals:   03/23/22 0712 03/23/22 1705 03/23/22 2023 03/24/22 0500  BP: 107/73 108/79 119/71   Pulse:   70   Resp: '19 18 16   '$ Temp: 98 F (36.7 C) 97.9 F (36.6 C) 99.2 F (37.3 C)   TempSrc:   Oral   SpO2: 96% 95% 91%   Weight:    (!) 191 kg  Height:       Constitutional: NAD, AAOx3 HEENT: conjunctivae and lids normal, EOMI CV: No cyanosis.   RESP: trach present SKIN: warm, dry Neuro: non-focal Psych: Normal mood and affect.   Data Reviewed:  There are no new results to review at this time.  Family Communication: None  Disposition: Status is: Inpatient Remains inpatient appropriate because: Medically stable, waiting for placement  Planned Discharge Destination: Skilled nursing facility   DVT prophylaxis-Eliquis    Author: Desma Maxim, MD 03/24/2022 12:36 PM  For on call review [email or web address].

## 2022-03-25 DIAGNOSIS — I5033 Acute on chronic diastolic (congestive) heart failure: Secondary | ICD-10-CM | POA: Diagnosis not present

## 2022-03-25 NOTE — Progress Notes (Signed)
  Progress Note   Patient: Gary [name] GNF:[id number] DOB: 1955-09-29 DOA: 01/23/2022     50 DOS: the patient was seen and examined on 03/25/2022   Brief hospital course: Gary [name] is a 66 y.o. male with medical history significant for diastolic CHF, chronic respiratory failure status post tracheostomy, COPD, essential hypertension, PUD and sleep apnea who presented to the emergency room with acute onset of worsening dyspnea.  Apparently patient has been in the ER for the past 10 days as he was unable to be cared at home specially requirement of tracheostomy care, awaiting placement. He developed worsening pulmonary edema. Being admitted for management of acute on chronic diastolic heart failure and given IV Lasix.  Patient condition has gradually improved.  He was able to tolerate nocturnal trilogy ventilation.  Currently pending nursing placement.   12/3-4: medically stable, waiting for placement  Assessment and Plan: * Acute on chronic diastolic (congestive) heart failure ([name]) Patient to have some peripheral edema and concern of pulmonary vascular congestion on chest x-ray.   - Continue torsemide  - seems well compensated now.  Acute on chronic respiratory failure with hypoxia ([name]) - Currently stable on baseline which required intermittent vent support with trach collar.  AKI (acute kidney injury) ([place]) Resolved after holding IV Lasix -Avoid nephrotoxins  Chronic obstructive pulmonary disease (COPD) ([name]) - Continue bronchodilators and home inhalers  GERD without esophagitis - continue PPI therapy.  Obesity, Class III, BMI 40-49.9 (morbid obesity) ([name]) Estimated body mass index is 50.62 kg/m as calculated from the following:   Height as of this encounter: '6\' 3"'$  (1.905 m).   Weight as of this encounter: 846.9 kg.  -Complicates prognosis  Anxiety and depression - continue Lexapro and Klonopin  Dyslipidemia - continue statin  therapy.  Hypokalemia Repleted  Constipation resolved        Subjective: feeling fine, breathing stable, tolerating diet  Physical Exam: Vitals:   03/24/22 1922 03/25/22 0400 03/25/22 0457 03/25/22 0459  BP: 125/75  111/73   Pulse:   75   Resp:   19   Temp: 99.1 F (37.3 C) 98.6 F (37 C)    TempSrc: Oral Oral    SpO2: 94%  94%   Weight:    (!) 196 kg  Height:       Constitutional: NAD, AAOx3 HEENT: atraumatic, trach collar in place CV: No cyanosis.   RESP: trach present, breathing comfortably SKIN: warm, dry Neuro: non-focal Psych: Normal mood and affect.   Data Reviewed:  There are no new results to review at this time.  Family Communication: None  Disposition: Status is: Inpatient Remains inpatient appropriate because: Medically stable, waiting for placement  Planned Discharge Destination: Skilled nursing facility   DVT prophylaxis-Eliquis    Author: Desma Maxim, MD 03/25/2022 12:52 PM  For on call review [email or web address].

## 2022-03-26 NOTE — Progress Notes (Signed)
Physical Therapy Treatment Patient Details Name: Gary [name] MRN: [id number] DOB: 06/16/1955 Today's Date: 03/26/2022   History of Present Illness Pt is a 66 y.o. 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  Prolonged admittances earlier this year for similar. MD assessment includes: Acute on chronic diastolic CHF, AKI, and acute on chronic respiratory failure with hypoxia.   PT Comments    Patient agreeable to PT. He complains of constipation today and declined attempting to stand. Patient sat on edge of bed for ~ 20 minutes. Reviewed exercises, demonstration provided from therapist, with importance of strengthening through full range of motion for optimal benefits for increased independence with transfers and ambulation. Assistance required for LAQ to achieve end range for knee extension. Patient continues to have require assistance for bed mobility, cues for sequencing, with fair sitting balance demonstrated. No dizziness or pain is reported with upright sitting activity today. Recommend to continue PT to maximize independence and to decrease caregiver burden.    Recommendations for follow up therapy are one component of a multi-disciplinary discharge planning process, led by the attending physician.  Recommendations may be updated based on patient status, additional functional criteria and insurance authorization.  Follow Up Recommendations  Skilled nursing-short term rehab (<3 hours/day) Can patient physically be transported by private vehicle: No   Assistance Recommended at Discharge Frequent or constant Supervision/Assistance  Patient can return home with the following Two people to help with walking and/or transfers;Two people to help with bathing/dressing/bathroom;Assistance with cooking/housework;Assist for transportation;Help with stairs or ramp for entrance    Equipment Recommendations  None recommended by PT    Recommendations for Other Services       Precautions / Restrictions Precautions Precautions: Fall Restrictions Weight Bearing Restrictions: No     Mobility  Bed Mobility Overal bed mobility: Needs Assistance Bed Mobility: Supine to Sit, Sit to Supine     Supine to sit: Min assist Sit to supine: Max assist   General bed mobility comments: assistance for trunk support to sit upright. assistance for BLE support to return to bed. patient able to reposition himself using bed controls once returned to bed. cues for sequencing with basic bed mobility tasks    Transfers                   General transfer comment: patient declined standing today due to constipation. he wants to try standing tomorrow    Ambulation/Gait                   Stairs             Wheelchair Mobility    Modified Rankin (Stroke Patients Only)       Balance Overall balance assessment: Needs assistance Sitting-balance support: Feet supported Sitting balance-[name] Scale: Fair                                      Cognition Arousal/Alertness: Awake/alert Behavior During Therapy: WFL for tasks assessed/performed Overall Cognitive Status: Within Functional Limits for tasks assessed Area of Impairment: Safety/judgement, Problem solving                       Following Commands: Follows one step commands consistently Safety/Judgement: Decreased awareness of safety, Decreased awareness of deficits  Exercises General Exercises - Lower Extremity Long Arc Quad: AAROM, Strengthening, Both, 5 reps, Seated Other Exercises Other Exercises: verbal cues for exercise technique and importance for stregthening through the full range of motion for optimal benefits    General Comments General comments (skin integrity, edema, etc.): total sitting time ~ 20 minutes with education provided on  exercise to perform while seated with demonstration from therapist provided. no dizziness or pain is reported with upright activity and no shortness of breath noted with activity. would recommend to be out of bed in chair using the lift with staff assistance for meals and for upright  conditioning      Pertinent Vitals/Pain Pain Assessment Pain Assessment: No/denies pain    Home Living                          Prior Function            PT Goals (current goals can now be found in the care plan section) Acute Rehab PT Goals Patient Stated Goal: to go home PT Goal Formulation: With patient Time For Goal Achievement: 04/06/22 Potential to Achieve Goals: Fair Progress towards PT goals: Progressing toward goals    Frequency    Min 2X/week      PT Plan Current plan remains appropriate    Co-evaluation              AM-PAC PT "6 Clicks" Mobility   Outcome Measure  Help needed turning from your back to your side while in a flat bed without using bedrails?: A Lot Help needed moving from lying on your back to sitting on the side of a flat bed without using bedrails?: A Lot Help needed moving to and from a bed to a chair (including a wheelchair)?: A Lot Help needed standing up from a chair using your arms (e.g., wheelchair or bedside chair)?: A Lot Help needed to walk in hospital room?: A Lot Help needed climbing 3-5 steps with a railing? : Total 6 Click Score: 11    End of Session Equipment Utilized During Treatment:  (trach mask oxygen, PMSV) Activity Tolerance: Patient tolerated treatment well Patient left: in bed;with call bell/phone within reach   PT Visit Diagnosis: Unsteadiness on feet (R26.81);Muscle weakness (generalized) (M62.81);Difficulty in walking, not elsewhere classified (R26.2)     Time: 1347-1430 PT Time Calculation (min) (ACUTE ONLY): 43 min  Charges:  $Therapeutic Exercise: 8-22 mins $Therapeutic Activity: 23-37 mins                      Gary [name], PT, MPT    Gary [name] 03/26/2022, 3:04 PM

## 2022-03-26 NOTE — Progress Notes (Signed)
  Progress Note   Patient: Gary [name] [id number]:[id number] DOB: 04/17/1955 DOA: 01/23/2022     51 DOS: the patient was seen and examined on 03/26/2022   Brief hospital course: Gary [name] is a 66 y.o. male with medical history significant for diastolic CHF, chronic respiratory failure status post tracheostomy, COPD, essential hypertension, PUD and sleep apnea who presented to the emergency room with acute onset of worsening dyspnea.  Apparently patient has been in the ER for the past 10 days as he was unable to be cared at home specially requirement of tracheostomy care, awaiting placement. He developed worsening pulmonary edema. Being admitted for management of acute on chronic diastolic heart failure and given IV Lasix.  Patient condition has gradually improved.  He was able to tolerate nocturnal trilogy ventilation.  Currently pending nursing placement.   12/3-4: medically stable, waiting for placement  Assessment and Plan: * Acute on chronic diastolic (congestive) heart failure ([name]) Patient to have some peripheral edema and concern of pulmonary vascular congestion on chest x-ray.   - Continue torsemide  - seems well compensated now.  Acute on chronic respiratory failure with hypoxia ([name]) - Currently stable on baseline which required intermittent vent support with trach collar.  AKI (acute kidney injury) ([place]) Resolved after holding IV Lasix -Avoid nephrotoxins  Chronic obstructive pulmonary disease (COPD) ([name]) - Continue bronchodilators and home inhalers  GERD without esophagitis - continue PPI therapy.  Obesity, Class III, BMI 40-49.9 (morbid obesity) ([name]) Estimated body mass index is 50.62 kg/m as calculated from the following:   Height as of this encounter: '6\' 3"'$  (1.905 m).   Weight as of this encounter: 466.5 kg.  -Complicates prognosis  Anxiety and depression - continue Lexapro and Klonopin  Dyslipidemia - continue statin  therapy.  Hypokalemia Repleted  Constipation Says current regimen not helpful - will add lactulose        Subjective: feeling fine, breathing stable, tolerating diet, says been a few days since he stooled  Physical Exam: Vitals:   03/25/22 0800 03/25/22 1500 03/25/22 2000 03/26/22 0800  BP: 123/75 125/75 116/78 117/81  Pulse: 73 82 82 88  Resp:   15   Temp: 97.7 F (36.5 C) 98.9 F (37.2 C) 98.2 F (36.8 C) 98.1 F (36.7 C)  TempSrc: Oral Oral Axillary Axillary  SpO2: 90% 90% 92%   Weight:      Height:       Constitutional: NAD, AAOx3 HEENT: atraumatic, trach collar in place CV: No cyanosis.   RESP: trach present, breathing comfortably SKIN: warm, dry Neuro: non-focal Psych: Normal mood and affect.   Data Reviewed:  There are no new results to review at this time.  Family Communication: None  Disposition: Status is: Inpatient Remains inpatient appropriate because: Medically stable, waiting for placement  Planned Discharge Destination: Skilled nursing facility   DVT prophylaxis-Eliquis    Author: Desma Maxim, MD 03/26/2022 1:38 PM  For on call review [email or web address].

## 2022-03-27 LAB — BASIC METABOLIC PANEL
Anion gap: 8 (ref 5–15)
BUN: 30 mg/dL — ABNORMAL HIGH (ref 8–23)
CO2: 33 mmol/L — ABNORMAL HIGH (ref 22–32)
Calcium: 8.4 mg/dL — ABNORMAL LOW (ref 8.9–10.3)
Chloride: 97 mmol/L — ABNORMAL LOW (ref 98–111)
Creatinine, Ser: 1.19 mg/dL (ref 0.61–1.24)
GFR, Estimated: >60 mL/min (ref >60)
Glucose, Bld: 96 mg/dL (ref 70–99)
Potassium: 4.2 mmol/L (ref 3.5–5.1)
Sodium: 138 mmol/L (ref 135–145)

## 2022-03-27 LAB — BLOOD GAS, VENOUS
Acid-Base Excess: 10.2 mmol/L — ABNORMAL HIGH (ref 0.0–2.0)
Bicarbonate: 38.2 mmol/L — ABNORMAL HIGH (ref 20.0–28.0)
O2 Saturation: 93.7 %
Patient temperature: 37
pCO2, Ven: 66 mmHg — ABNORMAL HIGH (ref 44–60)
pH, Ven: 7.37 (ref 7.25–7.43)
pO2, Ven: 67 mmHg — ABNORMAL HIGH (ref 32–45)

## 2022-03-27 MED ORDER — LACTULOSE 10 GM/15ML PO SOLN
20.0000 g | Freq: Every day | ORAL | Status: DC
  Administered 2022-04-04: 20 g via ORAL
  Filled 2022-03-27 (×3): qty 30

## 2022-03-27 NOTE — TOC Progression Note (Signed)
Transition of Care [facility]) - Progression Note    Patient Details  Name: Gary [name] MRN: [id number] Date of Birth: May 28, 1955  [facility] of [facility]) CM/SW Contact  Shelbie Hutching, RN Phone Number: 03/27/2022, 10:56 AM  Clinical Narrative:    Per PT notes last few sessions patient has deferred standing.  RNCM left a message again with [facility] to see if they had any vent beds available or a wait list that we could get on.     Expected Discharge Plan: [place] Barriers to Discharge: Continued Medical Work up, Ship broker, Unsafe home situation  Expected Discharge Plan and Services Expected Discharge Plan: [place] In-house Referral: Clinical Social Work     Living arrangements for the past 2 months: Single Family Home                                       Social Determinants of Health (SDOH) Interventions    Readmission Risk Interventions    12/31/2021    8:58 AM 12/20/2021    4:21 PM 05/04/2021    1:26 PM  Readmission Risk Prevention Plan  Transportation Screening Complete Complete Complete  Medication Review Press photographer) Complete Complete Complete  PCP or Specialist appointment within 3-5 days of discharge Complete Complete Complete  HRI or Home Care Consult Complete Complete Complete  SW Recovery Care/Counseling Consult Complete Complete Complete  Palliative Care Screening Complete Complete Not [place] Not Applicable Not Applicable Not Applicable

## 2022-03-27 NOTE — Progress Notes (Addendum)
Physical Therapy Treatment Patient Details Name: Gary [name] MRN: [id number] DOB: 01-Apr-1956 Today's Date: 03/27/2022   History of Present Illness Pt is a 66 y.o. 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  Prolonged admittances earlier this year for similar. MD assessment includes: Acute on chronic diastolic CHF, AKI, and acute on chronic respiratory failure with hypoxia.    PT Comments    Pt eager to work with PT/OT and stated he wanted to try "marching in place today" as he was pleased with how much fluid he has been able to get off.  However he struggled with standing tolerance today and was unable to really maintain standing well at all today.  Continues to need very elevated bed, but even with this struggled to get hips forward over feet and therefore UEs fatigued quickly and he would need to sit and rest relatively quickly.  Attempts to stand from lower height were pt aborted at the moment of "take-off."  Pt showed good effort with exercises post co-treat, though he c/o feeling very weak and fatigued after the mobility aspects of session. 43 minutes of co-treat with OT, additional PT apart from co-treat.   Recommendations for follow up therapy are one component of a multi-disciplinary discharge planning process, led by the attending physician.  Recommendations may be updated based on patient status, additional functional criteria and insurance authorization.  Follow Up Recommendations  Skilled nursing-short term rehab (<3 hours/day) Can patient physically be transported by private vehicle: No   Assistance Recommended at Discharge Frequent or constant Supervision/Assistance  Patient can return home with the following Two people to help with walking and/or transfers;Two people to help with bathing/dressing/bathroom;Assistance with cooking/housework;Assist for  transportation;Help with stairs or ramp for entrance   Equipment Recommendations  None recommended by PT    Recommendations for Other Services       Precautions / Restrictions Precautions Precautions: Fall Restrictions Weight Bearing Restrictions: No     Mobility  Bed Mobility Overal bed mobility: Needs Assistance Bed Mobility: Supine to Sit, Sit to Supine Rolling: Min assist   Supine to sit: Min assist Sit to supine: Mod assist (heavy assist with LEs)        Transfers Overall transfer level: Needs assistance Equipment used: Rolling walker (2 wheels) Transfers: Sit to/from Stand Sit to Stand: Min assist, +2 physical assistance, From elevated surface           General transfer comment: max elevated bed height (pt not agreeing to try from anything lower...) pt with many self aborted efforts and despite much cuing and ideas for different strategies he consistently would only agree to try on his terms    Ambulation/Gait               General Gait Details: ostensibly very eager to try marching in standing today, did not maintain upright long/well enough today to even try   Stairs             Wheelchair Mobility    Modified Rankin (Stroke Patients Only)       Balance Overall balance assessment: Needs assistance Sitting-balance support: Feet supported Sitting balance-[name] Scale: Fair     Standing balance support: Bilateral upper extremity supported, Reliant on assistive device for balance Standing balance-[name] Scale: Poor Standing balance comment: heavy use of rolling walker for support in standing, unable to get hip/shoulders plumb with BOS -  direct assist to keep hips forward that devolved to needing to sit relatively quickly on today' standing attempts                            Cognition Arousal/Alertness: Awake/alert Behavior During Therapy: WFL for tasks assessed/performed Overall Cognitive Status: Within Functional Limits  for tasks assessed Area of Impairment: Safety/judgement, Problem solving                       Following Commands: Follows one step commands consistently Safety/Judgement: Decreased awareness of safety, Decreased awareness of deficits   Problem Solving: Requires verbal cues General Comments: despite repeat cuing and extensive explanation, poor comprehension of safe transfer technique        Exercises Total Joint Exercises Ankle Circles/Pumps: Strengthening, 10 reps, Both Quad Sets: Strengthening, 10 reps, Both Short Arc Quad: AROM, AAROM, 10 reps, Strengthening (AROM on L, AAROM on R) Heel Slides: Strengthening, 10 reps (with resisted leg extensions) Hip ABduction/ADduction: AAROM, Strengthening, 10 reps    General Comments        Pertinent Vitals/Pain Pain Assessment Faces Pain Scale: Hurts little more Pain Location: L hand and R hip pain, general LE numbness pain Pain Intervention(s): Limited activity within patient's tolerance    Home Living                          Prior Function            PT Goals (current goals can now be found in the care plan section) Progress towards PT goals:  (slow progress)    Frequency    Min 2X/week      PT Plan Current plan remains appropriate    Co-evaluation   Reason for Co-Treatment: Complexity of the patient's impairments (multi-system involvement);For patient/therapist safety;To address functional/ADL transfers PT goals addressed during session: Mobility/safety with mobility;Strengthening/ROM;Balance OT goals addressed during session: ADL's and self-care      AM-PAC PT "6 Clicks" Mobility   Outcome Measure  Help needed turning from your back to your side while in a flat bed without using bedrails?: A Lot Help needed moving from lying on your back to sitting on the side of a flat bed without using bedrails?: A Lot Help needed moving to and from a bed to a chair (including a wheelchair)?: A Lot Help  needed standing up from a chair using your arms (e.g., wheelchair or bedside chair)?: A Lot Help needed to walk in hospital room?: Total Help needed climbing 3-5 steps with a railing? : Total 6 Click Score: 10    End of Session Equipment Utilized During Treatment: Oxygen (trach) Activity Tolerance: Patient tolerated treatment well Patient left: in bed;with call bell/phone within reach Nurse Communication: Mobility status PT Visit Diagnosis: Unsteadiness on feet (R26.81);Muscle weakness (generalized) (M62.81);Difficulty in walking, not elsewhere classified (R26.2)     Time: 1540-0867 PT Time Calculation (min) (ACUTE ONLY): 59 min  Charges:  $Therapeutic Exercise: 8-22 mins $Therapeutic Activity: 8-22 mins                     Kreg Shropshire, DPT 03/27/2022, 4:45 PM

## 2022-03-27 NOTE — Progress Notes (Signed)
  Progress Note   Patient: Gary [name] FKC:[id number] DOB: 10/07/1955 DOA: 01/23/2022     66 DOS: the patient was seen and examined on 03/27/2022   Brief hospital course: ARNELL SLIVINSKI is a 66 y.o. male with medical history significant for diastolic CHF, chronic respiratory failure status post tracheostomy, COPD, essential hypertension, PUD and sleep apnea who presented to the emergency room with acute onset of worsening dyspnea.  Apparently patient has been in the ER for the past 10 days as he was unable to be cared at home specially requirement of tracheostomy care, awaiting placement. He developed worsening pulmonary edema. Being admitted for management of acute on chronic diastolic heart failure and given IV Lasix.  Patient condition has gradually improved.  He was able to tolerate nocturnal trilogy ventilation.  Currently pending nursing placement.   12/3-4: medically stable, waiting for placement  Assessment and Plan: * Acute on chronic diastolic (congestive) heart failure ([name]) Patient to have some peripheral edema and concern of pulmonary vascular congestion on chest x-ray.   - Continue torsemide  - seems well compensated now.  Acute on chronic respiratory failure with hypoxia ([name]) - Currently stable on baseline which required intermittent vent support with trach collar. - patient says he is concerned he's retaining co2 as feeling more sleepy than usual last few days. Will check bmp with vbg.  AKI (acute kidney injury) ([place]) Resolved after holding IV Lasix -Avoid nephrotoxins  Chronic obstructive pulmonary disease (COPD) ([name]) - Continue bronchodilators and home inhalers  GERD without esophagitis - continue PPI therapy.  Obesity, Class III, BMI 40-49.9 (morbid obesity) ([name]) Estimated body mass index is 50.62 kg/m as calculated from the following:   Height as of this encounter: '6\' 3"'$  (1.905 m).   Weight as of this encounter: 749.4 kg.  -Complicates  prognosis  Anxiety and depression - continue Lexapro and Klonopin  Dyslipidemia - continue statin therapy.  Hypokalemia Repleted  Constipation Says current regimen not helpful - will add lactulose        Subjective: feeling fine, breathing stable, tolerating diet, says been a few days since he stooled. Says didn't get lactulose yesterday  Physical Exam: Vitals:   03/25/22 2000 03/26/22 0800 03/26/22 2200 03/27/22 1315  BP: 116/78 117/81 114/79   Pulse: 82 88 84   Resp: 15  16   Temp: 98.2 F (36.8 C) 98.1 F (36.7 C) 98.6 F (37 C)   TempSrc: Axillary Axillary Axillary   SpO2: 92%  92% 95%  Weight:      Height:       Constitutional: NAD, AAOx3 HEENT: atraumatic, trach collar in place CV: No cyanosis.   RESP: trach present, breathing comfortably SKIN: warm, dry Neuro: non-focal Psych: Normal mood and affect.   Data Reviewed:  There are no new results to review at this time.  Family Communication: None  Disposition: Status is: Inpatient Remains inpatient appropriate because: Medically stable, waiting for placement  Planned Discharge Destination: Skilled nursing facility   DVT prophylaxis-Eliquis    Author: Desma Maxim, MD 03/27/2022 1:49 PM  For on call review [email or web address].

## 2022-03-27 NOTE — Progress Notes (Addendum)
Occupational Therapy Treatment Patient Details Name: Gary [name] MRN: [id number] DOB: [birth year] Today's Date: 03/27/2022   History of present illness Pt is a 66 y.o. 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  Prolonged admittances earlier this year for similar. MD assessment includes: Acute on chronic diastolic CHF, AKI, and acute on chronic respiratory failure with hypoxia.   OT comments  Mr Schupp was seen for OT/PT co-treatment on this date. Upon arrival to room pt reclined in bed, agreeable to tx. Pt requires MAX A don B shoes and socks bed level. MIN A exit bed, fair static sitting balance. MOD Ax2 to scoot along EOB. MIN A x2 + RW sit<>stand x2 from max elevated bed height - pt unwilling to trial from lower surface endorses fear of falling. Pt making progress toward goals, will continue to follow POC. Discharge recommendation remains appropriate.     Recommendations for follow up therapy are one component of a multi-disciplinary discharge planning process, led by the attending physician.  Recommendations may be updated based on patient status, additional functional criteria and insurance authorization.    Follow Up Recommendations  Skilled nursing-short term rehab (<3 hours/day)     Assistance Recommended at Discharge Frequent or constant Supervision/Assistance  Patient can return home with the following  Two people to help with walking and/or transfers;Two people to help with bathing/dressing/bathroom   Equipment Recommendations  Hospital bed    Recommendations for Other Services      Precautions / Restrictions Precautions Precautions: Fall Restrictions Weight Bearing Restrictions: No       Mobility Bed Mobility Overal bed mobility: Needs Assistance Bed Mobility: Supine to Sit, Sit to Supine     Supine to sit: Min assist Sit to supine: Max  assist        Transfers Overall transfer level: Needs assistance Equipment used: Rolling walker (2 wheels) Transfers: Sit to/from Stand Sit to Stand: Min assist, +2 physical assistance, From elevated surface           General transfer comment: max elevated bed height     Balance Overall balance assessment: Needs assistance Sitting-balance support: Feet supported Sitting balance-[name] Scale: Fair     Standing balance support: Bilateral upper extremity supported, Reliant on assistive device for balance Standing balance-[name] Scale: Poor                             ADL either performed or assessed with clinical judgement   ADL Overall ADL's : Needs assistance/impaired                                       General ADL Comments: MAX A don B shoes bed level.      Cognition Arousal/Alertness: Awake/alert Behavior During Therapy: WFL for tasks assessed/performed Overall Cognitive Status: Within Functional Limits for tasks assessed Area of Impairment: Safety/judgement, Problem solving                       Following Commands: Follows one step commands consistently Safety/Judgement: Decreased awareness of safety, Decreased awareness of deficits   Problem Solving: Requires verbal cues General Comments: poor comprehension of safe transfer technique  Pertinent Vitals/ Pain       Pain Assessment Pain Assessment: Faces Faces Pain Scale: Hurts little more Pain Location: L digits 3-5 Pain Descriptors / Indicators: Burning Pain Intervention(s): Limited activity within patient's tolerance, Repositioned   Frequency  Min 2X/week        Progress Toward Goals  OT Goals(current goals can now be found in the care plan section)  Progress towards OT goals: Progressing toward goals  Acute Rehab OT Goals Patient Stated Goal: go home OT Goal Formulation: With patient Time For Goal Achievement: 04/02/22 Potential  to Achieve Goals: Fair ADL Goals Pt Will Perform Grooming: with modified independence;sitting Pt Will Perform Lower Body Dressing: sitting/lateral leans;with min assist Pt Will Transfer to Toilet: with min assist;bedside commode;stand pivot transfer  Plan Discharge plan remains appropriate;Frequency remains appropriate    Co-evaluation    PT/OT/SLP Co-Evaluation/Treatment: Yes Reason for Co-Treatment: For patient/therapist safety;To address functional/ADL transfers PT goals addressed during session: Mobility/safety with mobility OT goals addressed during session: ADL's and self-care      AM-PAC OT "6 Clicks" Daily Activity     Outcome Measure   Help from another person eating meals?: None Help from another person taking care of personal grooming?: A Little Help from another person toileting, which includes using toliet, bedpan, or urinal?: A Lot Help from another person bathing (including washing, rinsing, drying)?: A Lot Help from another person to put on and taking off regular upper body clothing?: A Little Help from another person to put on and taking off regular lower body clothing?: A Lot 6 Click Score: 16    End of Session    OT Visit Diagnosis: Other abnormalities of gait and mobility (R26.89);Muscle weakness (generalized) (M62.81)   Activity Tolerance Patient tolerated treatment well   Patient Left in bed;with call bell/phone within reach   Nurse Communication          Time: 3709-6438 OT Time Calculation (min): 43 min  Charges: OT General Charges $OT Visit: 1 Visit OT Treatments $Self Care/Home Management : 23-37 mins  [name], M.S. OTR/L  03/27/22, 4:36 PM  ascom [phone]

## 2022-03-28 DIAGNOSIS — J9611 Chronic respiratory failure with hypoxia: Secondary | ICD-10-CM

## 2022-03-28 NOTE — Progress Notes (Signed)
PT Cancellation Note  Patient Details Name: Gary [name] MRN: [id number] DOB: 1955-05-31   Cancelled Treatment:    Reason Eval/Treat Not Completed: Patient declined, no reason specified Attempted typical early afternoon co-treat.  Pt refusing today states "I need to take today off, every time I move I sh!t on myself."  Offered trying to do some U&LE exercises without moving but he is persistent on his desire for a day off.  Will continue with PT and to encourage as much mobility as he will participate with. Kreg Shropshire, DPT 03/28/2022, 2:30 PM

## 2022-03-28 NOTE — Progress Notes (Signed)
PROGRESS NOTE    Gary [name]  WFU:[id number] DOB: 1955/10/27 DOA: 01/23/2022 PCP: [facility]  Assessment & Plan:   Principal Problem:   Acute on chronic diastolic (congestive) heart failure ([name]) Active Problems:   Acute on chronic respiratory failure with hypoxia ([name])   AKI (acute kidney injury) ([place])   Chronic obstructive pulmonary disease (COPD) ([name])   Obesity, Class III, BMI 40-49.9 (morbid obesity) ([place])   GERD without esophagitis   Anxiety and depression   Dyslipidemia   Hypokalemia  Assessment and Plan: Acute on chronic diastolic  CHF: CXR shows pulmon vasc congestion on 02/19/22. Continue on torsemide. Monitor I/Os. Currently compensated   Acute on chronic hypoxic respiratory failure: s/p trach. Intermittent vent support.   AKI: resolved  COPD: continue on bronchodilators. Encourage incentive spirometry   GERD: continue on PPI   Morbid obesity: BMI 51.7. Complicates overall care & prognosis    Anxiety: severity unknown. Continue on klonopin prn   Depression: severity unknown. Continue on home dose of escitalopram    HLD: continue on statin    Hypokalemia: WNL today    Constipation: resolved       DVT prophylaxis: eliquis Code Status: full  Family Communication:  Disposition Plan: unsafe d/c plan   Level of care: Progressive  Status is: Inpatient Remains inpatient appropriate because: unsafe d/c plan     Consultants:  ICU   Procedures:  Antimicrobials:    Subjective: Pt c/o malaise   Objective: Vitals:   03/27/22 1255 03/27/22 1315 03/27/22 2100 03/28/22 0500  BP: 116/78  113/76 116/77  Pulse: 80   77  Resp:   19 20  Temp: 98.7 F (37.1 C)  98.2 F (36.8 C)   TempSrc: Oral  Oral   SpO2: 94% 95% 93% 93%  Weight:    (!) 187.8 kg  Height:        Intake/Output Summary (Last 24 hours) at 03/28/2022 0909 Last data filed at 03/28/2022 0550 Gross per 24 hour  Intake 1040 ml  Output 1850 ml  Net -810 ml    Filed Weights   03/24/22 0500 03/25/22 0459 03/28/22 0500  Weight: (!) 191 kg (!) 196 kg (!) 187.8 kg    Examination:  General exam: Appears calm and comfortable. Morbidly obese Respiratory system: diminished breath sounds b/l  Cardiovascular system: S1 & S2 +. No rubs, gallops or clicks. Gastrointestinal system: Abdomen is obese, soft and nontender. Normal bowel sounds heard. Central nervous system: Alert and oriented. Moves all extremities  Psychiatry: Judgement and insight appears at baseline. Flat mood and affect     Data Reviewed: I have personally reviewed following labs and imaging studies  CBC: No results for input(s): "WBC", "NEUTROABS", "HGB", "HCT", "MCV", "PLT" in the last 168 hours. Basic Metabolic Panel: Recent Labs  Lab 03/22/22 0433 03/27/22 1608  NA 140 138  K 4.2 4.2  CL 103 97*  CO2 30 33*  GLUCOSE 142* 96  BUN 26* 30*  CREATININE 1.19 1.19  CALCIUM 8.3* 8.4*   GFR: Estimated Creatinine Clearance: 111.6 mL/min (by C-G formula based on SCr of 1.19 mg/dL). Liver Function Tests: No results for input(s): "AST", "ALT", "ALKPHOS", "BILITOT", "PROT", "ALBUMIN" in the last 168 hours. No results for input(s): "LIPASE", "AMYLASE" in the last 168 hours. No results for input(s): "AMMONIA" in the last 168 hours. Coagulation Profile: No results for input(s): "INR", "PROTIME" in the last 168 hours. Cardiac Enzymes: No results for input(s): "CKTOTAL", "CKMB", "CKMBINDEX", "TROPONINI" in the last  168 hours. BNP (last 3 results) No results for input(s): "PROBNP" in the last 8760 hours. HbA1C: No results for input(s): "HGBA1C" in the last 72 hours. CBG: No results for input(s): "GLUCAP" in the last 168 hours. Lipid Profile: No results for input(s): "CHOL", "HDL", "LDLCALC", "TRIG", "CHOLHDL", "LDLDIRECT" in the last 72 hours. Thyroid Function Tests: No results for input(s): "TSH", "T4TOTAL", "FREET4", "T3FREE", "THYROIDAB" in the last 72 hours. Anemia  Panel: No results for input(s): "VITAMINB12", "FOLATE", "FERRITIN", "TIBC", "IRON", "RETICCTPCT" in the last 72 hours. Sepsis Labs: No results for input(s): "PROCALCITON", "LATICACIDVEN" in the last 168 hours.  No results found for this or any previous visit (from the past 240 hour(s)).       Radiology Studies: No results found.      Scheduled Meds:  ammonium lactate   Topical BID   apixaban  2.5 mg Oral BID   Chlorhexidine Gluconate Cloth  6 each Topical Q0600   vitamin B-12  500 mcg Oral Daily   diclofenac Sodium  2 g Topical TID   escitalopram  5 mg Oral Daily   ferrous sulfate  325 mg Oral BID WC   gabapentin  300 mg Oral TID   guaiFENesin  600 mg Oral BID   Ipratropium-Albuterol  2 puff Inhalation BID   lactulose  20 g Oral Daily   melatonin  5 mg Oral QHS   mouth rinse  15 mL Mouth Rinse 4 times per day   pantoprazole  40 mg Oral Daily   polyethylene glycol  34 g Oral Daily   potassium chloride  10 mEq Oral Daily   simvastatin  10 mg Oral q1800   torsemide  20 mg Oral Daily   Continuous Infusions:  sodium chloride Stopped (02/26/22 1159)     LOS: 53 days    Time spent: 25 mins     Wyvonnia Dusky, MD [facility] Pager [phone]  If 7PM-7AM, please contact night-coverage [email or web address] 03/28/2022, 9:09 AM

## 2022-03-29 NOTE — Progress Notes (Signed)
Occupational Therapy Treatment Patient Details Name: Gary [name] MRN: [id number] DOB: August 13, 1955 Today's Date: 03/29/2022   History of present illness Pt is a 66 y.o. 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  Prolonged admittances earlier this year for similar. MD assessment includes: Acute on chronic diastolic CHF, AKI, and acute on chronic respiratory failure with hypoxia.   OT comments  Mr Payette was seen for OT /PT co-treatment on this date. Upon arrival to room pt reclined in bed, agreeable to tx. Pt requires MIN A exit bed, fair sitting balance. MIN A x2 + RW sit<>stand at max elevated bed height, improved anterior weight shifting and foot placement, achieves upright posture x2 trials for 60" each. 3rd trial ~10 sec. Use of maxisky bed>chair t/f. Pt making progress toward goals, will continue to follow POC. Discharge recommendation remains appropriate.     Recommendations for follow up therapy are one component of a multi-disciplinary discharge planning process, led by the attending physician.  Recommendations may be updated based on patient status, additional functional criteria and insurance authorization.    Follow Up Recommendations  Skilled nursing-short term rehab (<3 hours/day)     Assistance Recommended at Discharge Frequent or constant Supervision/Assistance  Patient can return home with the following  Two people to help with walking and/or transfers;Two people to help with bathing/dressing/bathroom   Equipment Recommendations  Hospital bed    Recommendations for Other Services      Precautions / Restrictions Precautions Precautions: Fall Restrictions Weight Bearing Restrictions: No RLE Weight Bearing: Weight bearing as tolerated       Mobility Bed Mobility Overal bed mobility: Needs Assistance Bed Mobility: Supine to Sit     Supine to  sit: Min assist          Transfers Overall transfer level: Needs assistance Equipment used: Rolling walker (2 wheels) Transfers: Sit to/from Stand Sit to Stand: From elevated surface, Min assist, +2 physical assistance           General transfer comment: max elevated bed height, improved anterior weight shifting and foot placement, achieves upright posture x2 trials for 60" each. 3rd trial ~10 sec     Balance Overall balance assessment: Needs assistance Sitting-balance support: Feet supported Sitting balance-[name] Scale: Good     Standing balance support: Bilateral upper extremity supported, Reliant on assistive device for balance Standing balance-[name] Scale: Fair                             ADL either performed or assessed with clinical judgement   ADL Overall ADL's : Needs assistance/impaired                                       General ADL Comments: MAX A don B shoes bed level.      Cognition Arousal/Alertness: Awake/alert Behavior During Therapy: WFL for tasks assessed/performed Overall Cognitive Status: Within Functional Limits for tasks assessed                                                General Comments maxisky bed>chair    Pertinent Vitals/ Pain  Pain Assessment Pain Assessment: Faces Faces Pain Scale: Hurts a little bit Pain Location: L hand and R hip pain, general LE numbness pain Pain Descriptors / Indicators: Burning Pain Intervention(s): Limited activity within patient's tolerance, Repositioned   Frequency  Min 2X/week        Progress Toward Goals  OT Goals(current goals can now be found in the care plan section)  Progress towards OT goals: Progressing toward goals  Acute Rehab OT Goals Patient Stated Goal: to go home OT Goal Formulation: With patient Time For Goal Achievement: 04/02/22 Potential to Achieve Goals: Fair ADL Goals Pt Will Perform Grooming: with modified  independence;sitting Pt Will Perform Lower Body Dressing: sitting/lateral leans;with min assist Pt Will Transfer to Toilet: with min assist;bedside commode;stand pivot transfer  Plan Discharge plan remains appropriate;Frequency remains appropriate    Co-evaluation    PT/OT/SLP Co-Evaluation/Treatment: Yes Reason for Co-Treatment: For patient/therapist safety;To address functional/ADL transfers PT goals addressed during session: Mobility/safety with mobility OT goals addressed during session: ADL's and self-care      AM-PAC OT "6 Clicks" Daily Activity     Outcome Measure   Help from another person eating meals?: None Help from another person taking care of personal grooming?: A Little Help from another person toileting, which includes using toliet, bedpan, or urinal?: A Lot Help from another person bathing (including washing, rinsing, drying)?: A Lot Help from another person to put on and taking off regular upper body clothing?: A Little Help from another person to put on and taking off regular lower body clothing?: A Lot 6 Click Score: 16    End of Session Equipment Utilized During Treatment: Rolling walker (2 wheels)  OT Visit Diagnosis: Other abnormalities of gait and mobility (R26.89);Muscle weakness (generalized) (M62.81)   Activity Tolerance Patient tolerated treatment well   Patient Left in chair;with call bell/phone within reach   Nurse Communication          Time: 1191-4782 OT Time Calculation (min): 42 min  Charges: OT General Charges $OT Visit: 1 Visit OT Treatments $Therapeutic Activity: 23-37 mins  [name], M.S. OTR/L  03/29/22, 3:20 PM  ascom [phone]

## 2022-03-29 NOTE — Treatment Plan (Signed)
Patient requested that he is given extra dose of torsemide. Sharion Settler NP notified about patient's concern. Patient's saturation is 91, denies any sob, on a trach collar for 5L and Fio2 of 28%. Patient will be placed on his home vent at 2300.

## 2022-03-29 NOTE — Progress Notes (Signed)
Physical Therapy Treatment Patient Details Name: Gary [name] MRN: [id number] DOB: Jan 10, 1956 Today's Date: 03/29/2022   History of Present Illness Pt is a 66 y.o. 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  Prolonged admittances earlier this year for similar. MD assessment includes: Acute on chronic diastolic CHF, AKI, and acute on chronic respiratory failure with hypoxia.    PT Comments    Pt did 3 standing attempts (~60 seconds each time) but per his usual needed heavy cuing and encouragement, varying degrees of physical assist to attain and maintain standing and generally not showing anything close to the ability to step/weight shift/etc as he had been doing in recent weeks.  Lift to recliner end of session.     Recommendations for follow up therapy are one component of a multi-disciplinary discharge planning process, led by the attending physician.  Recommendations may be updated based on patient status, additional functional criteria and insurance authorization.  Follow Up Recommendations  Skilled nursing-short term rehab (<3 hours/day) Can patient physically be transported by private vehicle: No   Assistance Recommended at Discharge Frequent or constant Supervision/Assistance  Patient can return home with the following Two people to help with walking and/or transfers;Two people to help with bathing/dressing/bathroom;Assistance with cooking/housework;Assist for transportation;Help with stairs or ramp for entrance   Equipment Recommendations  None recommended by PT    Recommendations for Other Services       Precautions / Restrictions Precautions Precautions: Fall Restrictions RLE Weight Bearing: Weight bearing as tolerated     Mobility  Bed Mobility Overal bed mobility: Needs Assistance Bed Mobility: Supine to Sit     Supine to sit: Min assist      General bed mobility comments: Pt with good effort, getting LEs to EOB and using rail for heavy UE assist to sitting, some extra time but no direct assist needed today.  Pt showed good assist with U&LEs to scoot hips back in bed after each standing attempt    Transfers Overall transfer level: Needs assistance Equipment used: Rolling walker (2 wheels) Transfers: Sit to/from Stand Sit to Stand: From elevated surface, Min assist, Mod assist           General transfer comment: max elevated bed height (pt not agreeing to try from anything lower...) pt continues to need plenty of encouragement, time between reps and rocking back and forth.  He was better able to shift hips forward today and stand more upright but still needs heavy encouargement to tolerate standing for 60 seconds on each of the 3 successful standing attempts.  Pt does not control descent particularly well.    Ambulation/Gait               General Gait Details: unable to do anyweight shift, foot slide or other LE movements in standing this date   Stairs             Wheelchair Mobility    Modified Rankin (Stroke Patients Only)       Balance Overall balance assessment: Needs assistance Sitting-balance support: Feet supported Sitting balance-[name] Scale: Good Sitting balance - Comments: Pt able to maintain sitting relativley well with light UE use   Standing balance support: Bilateral upper extremity supported, Reliant on assistive device for balance Standing balance-[name] Scale: Poor Standing balance comment: heavy use of rolling walker for support in standing, nearly able to get hip/shoulders plumb with  BOS - direct assist to get hips forward, able to maintain for <1 minute before hips start to shift back again                            Cognition Arousal/Alertness: Awake/alert Behavior During Therapy: [facility] for tasks assessed/performed Overall Cognitive Status: Within Functional Limits for  tasks assessed                                          Exercises      General Comments General comments (skin integrity, edema, etc.): Pt continues to need heavy encouragement, only really do things his way and though he reports feeling better today than the prior 2 days states that "the medicine they gave me messed me up, took my strength." Loft to recliner end of sessio      Pertinent Vitals/Pain Pain Assessment Faces Pain Scale: Hurts a little bit Pain Location: L hand and R hip pain, general LE numbness pain    Home Living                          Prior Function            PT Goals (current goals can now be found in the care plan section) Progress towards PT goals: Progressing toward goals (slow progress)    Frequency    Min 2X/week      PT Plan Current plan remains appropriate    Co-evaluation PT/OT/SLP Co-Evaluation/Treatment: Yes Reason for Co-Treatment: Necessary to address cognition/behavior during functional activity;For patient/therapist safety;To address functional/ADL transfers PT goals addressed during session: Mobility/safety with mobility;Proper use of DME;Strengthening/ROM        AM-PAC PT "6 Clicks" Mobility   Outcome Measure  Help needed turning from your back to your side while in a flat bed without using bedrails?: A Little Help needed moving from lying on your back to sitting on the side of a flat bed without using bedrails?: A Little Help needed moving to and from a bed to a chair (including a wheelchair)?: A Lot Help needed standing up from a chair using your arms (e.g., wheelchair or bedside chair)?: A Lot Help needed to walk in hospital room?: Total Help needed climbing 3-5 steps with a railing? : Total 6 Click Score: 12    End of Session Equipment Utilized During Treatment: Oxygen (trach) Activity Tolerance: Patient tolerated treatment well Patient left: in bed;with call bell/phone within reach Nurse  Communication: Mobility status PT Visit Diagnosis: Unsteadiness on feet (R26.81);Muscle weakness (generalized) (M62.81);Difficulty in walking, not elsewhere classified (R26.2)     Time: 7017-7939 PT Time Calculation (min) (ACUTE ONLY): 40 min  Charges:  $Therapeutic Activity: 8-22 mins                     Kreg Shropshire, DPT 03/29/2022, 3:16 PM

## 2022-03-29 NOTE — Progress Notes (Signed)
PROGRESS NOTE    Gary [name]  LZJ:[id number] DOB: [birth year] DOA: 01/23/2022 PCP: [facility]  Assessment & Plan:   Principal Problem:   Acute on chronic diastolic (congestive) heart failure ([name]) Active Problems:   Acute on chronic respiratory failure with hypoxia ([name])   AKI (acute kidney injury) ([place])   Chronic obstructive pulmonary disease (COPD) ([name])   Obesity, Class III, BMI 40-49.9 (morbid obesity) ([place])   GERD without esophagitis   Anxiety and depression   Dyslipidemia   Hypokalemia  Assessment and Plan: Acute on chronic diastolic  CHF: CXR shows pulmon vasc congestion on 02/19/22. Continue on torsemide. Monitor I/Os. Currently compensated   Acute on chronic hypoxic respiratory failure: s/p trach. Intermittent vent support.   AKI: resolved  COPD: continue on bronchodilators. Encourage incentive spirometry   GERD: continue on PPI    Morbid obesity: BMI 51.7. Complicates overall care & prognosis    Anxiety: severity unknown. Continue on klonopin prn   Depression: severity unknown. Continue on home dose of escitalopram    HLD: continue on statin    Hypokalemia: WNL today    Constipation: resolved       DVT prophylaxis: eliquis Code Status: full  Family Communication:  Disposition Plan: unsafe d/c plan   Level of care: Progressive  Status is: Inpatient Remains inpatient appropriate because: unsafe d/c plan     Consultants:  ICU   Procedures:  Antimicrobials:    Subjective: Pt c/o being in the hospital for months w/o going to rehab.   Objective: Vitals:   03/27/22 2100 03/28/22 0500 03/28/22 1800 03/28/22 2100  BP: 113/76 116/77 115/86 117/66  Pulse:  77 80 81  Resp: '19 20 20 '$ (!) 21  Temp: 98.2 F (36.8 C)  98.5 F (36.9 C) 98.9 F (37.2 C)  TempSrc: Oral  Oral Axillary  SpO2: 93% 93% 93% 95%  Weight:  (!) 187.8 kg    Height:        Intake/Output Summary (Last 24 hours) at 03/29/2022 0827 Last data filed  at 03/29/2022 0502 Gross per 24 hour  Intake 480 ml  Output 750 ml  Net -270 ml   Filed Weights   03/24/22 0500 03/25/22 0459 03/28/22 0500  Weight: (!) 191 kg (!) 196 kg (!) 187.8 kg    Examination:  General exam: Appears agitated and frustrated. Morbidly obese Respiratory system: decreased breath sounds b/l  Cardiovascular system: S1/S2+. No rubs or clicks.  Gastrointestinal system: Abd is soft, NT, obese & normal bowel sounds. Central nervous system: Alert and awake. Moves all extremities  Psychiatry: judgement and insight appears at baseline. Appears agitated and frustrated     Data Reviewed: I have personally reviewed following labs and imaging studies  CBC: No results for input(s): "WBC", "NEUTROABS", "HGB", "HCT", "MCV", "PLT" in the last 168 hours. Basic Metabolic Panel: Recent Labs  Lab 03/27/22 1608  NA 138  K 4.2  CL 97*  CO2 33*  GLUCOSE 96  BUN 30*  CREATININE 1.19  CALCIUM 8.4*   GFR: Estimated Creatinine Clearance: 111.6 mL/min (by C-G formula based on SCr of 1.19 mg/dL). Liver Function Tests: No results for input(s): "AST", "ALT", "ALKPHOS", "BILITOT", "PROT", "ALBUMIN" in the last 168 hours. No results for input(s): "LIPASE", "AMYLASE" in the last 168 hours. No results for input(s): "AMMONIA" in the last 168 hours. Coagulation Profile: No results for input(s): "INR", "PROTIME" in the last 168 hours. Cardiac Enzymes: No results for input(s): "CKTOTAL", "CKMB", "CKMBINDEX", "TROPONINI" in the last  168 hours. BNP (last 3 results) No results for input(s): "PROBNP" in the last 8760 hours. HbA1C: No results for input(s): "HGBA1C" in the last 72 hours. CBG: No results for input(s): "GLUCAP" in the last 168 hours. Lipid Profile: No results for input(s): "CHOL", "HDL", "LDLCALC", "TRIG", "CHOLHDL", "LDLDIRECT" in the last 72 hours. Thyroid Function Tests: No results for input(s): "TSH", "T4TOTAL", "FREET4", "T3FREE", "THYROIDAB" in the last 72  hours. Anemia Panel: No results for input(s): "VITAMINB12", "FOLATE", "FERRITIN", "TIBC", "IRON", "RETICCTPCT" in the last 72 hours. Sepsis Labs: No results for input(s): "PROCALCITON", "LATICACIDVEN" in the last 168 hours.  No results found for this or any previous visit (from the past 240 hour(s)).       Radiology Studies: No results found.      Scheduled Meds:  ammonium lactate   Topical BID   apixaban  2.5 mg Oral BID   Chlorhexidine Gluconate Cloth  6 each Topical Q0600   vitamin B-12  500 mcg Oral Daily   diclofenac Sodium  2 g Topical TID   escitalopram  5 mg Oral Daily   ferrous sulfate  325 mg Oral BID WC   gabapentin  300 mg Oral TID   guaiFENesin  600 mg Oral BID   Ipratropium-Albuterol  2 puff Inhalation BID   lactulose  20 g Oral Daily   melatonin  5 mg Oral QHS   mouth rinse  15 mL Mouth Rinse 4 times per day   pantoprazole  40 mg Oral Daily   polyethylene glycol  34 g Oral Daily   potassium chloride  10 mEq Oral Daily   simvastatin  10 mg Oral q1800   torsemide  20 mg Oral Daily   Continuous Infusions:  sodium chloride Stopped (02/26/22 1159)     LOS: 54 days    Time spent: 25 mins     Wyvonnia Dusky, MD [facility] Pager [phone]  If 7PM-7AM, please contact night-coverage [email or web address] 03/29/2022, 8:27 AM

## 2022-03-30 DIAGNOSIS — I5033 Acute on chronic diastolic (congestive) heart failure: Secondary | ICD-10-CM | POA: Diagnosis not present

## 2022-03-30 DIAGNOSIS — N179 Acute kidney failure, unspecified: Secondary | ICD-10-CM | POA: Diagnosis not present

## 2022-03-30 DIAGNOSIS — J449 Chronic obstructive pulmonary disease, unspecified: Secondary | ICD-10-CM | POA: Diagnosis not present

## 2022-03-30 DIAGNOSIS — J9621 Acute and chronic respiratory failure with hypoxia: Secondary | ICD-10-CM | POA: Diagnosis not present

## 2022-03-30 NOTE — Progress Notes (Signed)
Mobility Specialist - Progress Note   03/30/22 1500  Mobility  Range of Motion/Exercises All extremities;Active  Activity Response Tolerated well  $Mobility charge 1 Mobility     Pt lying in bed upon arrival, utilizing 5L trach. Pt agreeable to activity. Performed bed-level exercises using theraband 4x10 reps. AAROM on RLE. Decreased ROM in L shoulder; decreased strength/grip in L hand. Pt left in bed with needs in reach.    Kathee Delton Mobility Specialist 03/30/22, 3:53 PM

## 2022-03-30 NOTE — Assessment & Plan Note (Signed)
Patient to have some peripheral edema and concern of pulmonary vascular congestion on chest x-ray.   - Continue torsemide  - seems well compensated for now.

## 2022-03-30 NOTE — Assessment & Plan Note (Signed)
Estimated body mass index is 51.75 kg/m as calculated from the following:   Height as of this encounter: '6\' 3"'$  (1.905 m).   Weight as of this encounter: 945.0 kg.   -Complicates prognosis

## 2022-03-30 NOTE — Assessment & Plan Note (Addendum)
Resolved.  On torsemide now

## 2022-03-30 NOTE — Assessment & Plan Note (Signed)
-  continue PPI therapy

## 2022-03-30 NOTE — Progress Notes (Signed)
PT Cancellation Note  Patient Details Name: Gary [name] MRN: [id number] DOB: 1955/09/21   Cancelled Treatment:     Multiple attempts made to treat pt today. Pt refused." Im just too tired today." Acute PT will continue to follow and progress as able per current POC.    [name] 03/30/2022, 2:29 PM

## 2022-03-30 NOTE — Assessment & Plan Note (Signed)
-   continue statin therapy. 

## 2022-03-30 NOTE — Assessment & Plan Note (Signed)
-   continue Lexapro and Klonopin

## 2022-03-30 NOTE — Assessment & Plan Note (Signed)
repleted ?

## 2022-03-30 NOTE — Assessment & Plan Note (Signed)
-   Continue bronchodilators and home inhalers

## 2022-03-30 NOTE — Progress Notes (Signed)
  Progress Note   Patient: Gary [name] BVQ:[id number] DOB: 10-18-1955 DOA: 01/23/2022     55 DOS: the patient was seen and examined on 03/30/2022   Brief hospital course:  Gary [name] is a 66 y.o. male with medical history significant for diastolic CHF, chronic respiratory failure status post tracheostomy, COPD, essential hypertension, PUD and sleep apnea who presented to the emergency room with acute onset of worsening dyspnea.  Apparently patient has been in the ER for the past 10 days as he was unable to be cared at home specially requirement of tracheostomy care, awaiting placement. He developed worsening pulmonary edema. Being admitted for management of acute on chronic diastolic heart failure and given IV Lasix.  Patient condition has gradually improved.  He was able to tolerate nocturnal trilogy ventilation.  Currently pending nursing placement.  12/3-15: medically stable, waiting for placement  Assessment and Plan: * Acute on chronic diastolic (congestive) heart failure ([name]) Patient to have some peripheral edema and concern of pulmonary vascular congestion on chest x-ray.   - Continue torsemide  - seems well compensated for now.   Acute on chronic respiratory failure with hypoxia ([name]) - Currently stable on baseline which required intermittent vent support with trach collar.  AKI (acute kidney injury) ([place]) Resolved.  On torsemide now  Chronic obstructive pulmonary disease (COPD) ([name]) - Continue bronchodilators and home inhalers  GERD without esophagitis - continue PPI therapy.  Obesity, Class III, BMI 40-49.9 (morbid obesity) ([place]) Estimated body mass index is 51.75 kg/m as calculated from the following:   Height as of this encounter: '6\' 3"'$  (1.905 m).   Weight as of this encounter: 800.3 kg.   -Complicates prognosis  Anxiety and depression - continue Lexapro and Klonopin  Dyslipidemia - continue statin therapy.  Hypokalemia repleted         Subjective: No new issues  Physical Exam: Vitals:   03/29/22 0956 03/29/22 1930 03/29/22 2100 03/30/22 0753  BP: (!) 98/40  106/67 106/69  Pulse: 77  82 79  Resp: 20  (!) 22 (!) 22  Temp: 97.7 F (36.5 C)  98.4 F (36.9 C) 98.3 F (36.8 C)  TempSrc: Oral  Oral Oral  SpO2: 91% 93% 91% 90%  Weight:      Height:       General exam: Appears agitated and frustrated. Morbidly obese Respiratory system: decreased breath sounds b/l  Cardiovascular system: S1/S2+. No rubs or clicks.  Gastrointestinal system: Abd is soft, NT, obese & normal bowel sounds. Central nervous system: Alert and awake. Moves all extremities  Psychiatry: judgement and insight appears at baseline.  Data Reviewed:  There are no new results to review at this time.  Family Communication: None  Disposition: Status is: Inpatient Remains inpatient appropriate because: Medically stable, waiting for placement  Planned Discharge Destination: Skilled nursing facility   DVT prophylaxis-Eliquis Time spent: 15 minutes  Author: Max Sane, MD 03/30/2022 1:28 PM  For on call review [email or web address].

## 2022-03-30 NOTE — Progress Notes (Signed)
Called by RN for Patient ready to go on Home Trilogy Ventilator, but upon arrival PT stated that he did not think he was going to go on his Vent tonight. PT further stated that he was up, awake and did not need Respiratory at this time. Patient was instructed to request RT when he is ready.

## 2022-03-30 NOTE — Assessment & Plan Note (Signed)
-   Currently stable on baseline which required intermittent vent support with trach collar.

## 2022-03-31 DIAGNOSIS — I5033 Acute on chronic diastolic (congestive) heart failure: Secondary | ICD-10-CM | POA: Diagnosis not present

## 2022-03-31 LAB — CBC
HCT: 37.4 % — ABNORMAL LOW (ref 39.0–52.0)
Hemoglobin: 10.2 g/dL — ABNORMAL LOW (ref 13.0–17.0)
MCH: 21.6 pg — ABNORMAL LOW (ref 26.0–34.0)
MCHC: 27.3 g/dL — ABNORMAL LOW (ref 30.0–36.0)
MCV: 79.2 fL — ABNORMAL LOW (ref 80.0–100.0)
Platelets: 233 10*3/uL (ref 150–400)
RBC: 4.72 MIL/uL (ref 4.22–5.81)
RDW: 19.5 % — ABNORMAL HIGH (ref 11.5–15.5)
WBC: 8.4 10*3/uL (ref 4.0–10.5)
nRBC: 0.2 % (ref 0.0–0.2)

## 2022-03-31 LAB — BASIC METABOLIC PANEL
Anion gap: 9 (ref 5–15)
BUN: 30 mg/dL — ABNORMAL HIGH (ref 8–23)
CO2: 28 mmol/L (ref 22–32)
Calcium: 8.6 mg/dL — ABNORMAL LOW (ref 8.9–10.3)
Chloride: 103 mmol/L (ref 98–111)
Creatinine, Ser: 1.42 mg/dL — ABNORMAL HIGH (ref 0.61–1.24)
GFR, Estimated: 55 mL/min — ABNORMAL LOW (ref >60)
Glucose, Bld: 150 mg/dL — ABNORMAL HIGH (ref 70–99)
Potassium: 3.4 mmol/L — ABNORMAL LOW (ref 3.5–5.1)
Sodium: 140 mmol/L (ref 135–145)

## 2022-03-31 MED ORDER — POTASSIUM CHLORIDE CRYS ER 20 MEQ PO TBCR
20.0000 meq | EXTENDED_RELEASE_TABLET | Freq: Once | ORAL | Status: AC
  Administered 2022-03-31: 20 meq via ORAL
  Filled 2022-03-31: qty 1

## 2022-03-31 NOTE — Progress Notes (Signed)
Physical Therapy Treatment Patient Details Name: Gary [name] MRN: [id number] DOB: 1955-08-01 Today's Date: 03/31/2022   History of Present Illness Pt is a 66 y.o. 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  Prolonged admittances earlier this year for similar. MD assessment includes: Acute on chronic diastolic CHF, AKI, and acute on chronic respiratory failure with hypoxia.    PT Comments    Pt was in overall good spirits today and was agreeable to session. Put forth good effort throughout. Did exit R side of bed, stand 3 x and even take 3 total steps. Severely limited by fatigue/weakness, and fear of falling. Unable to take enough steps to turn to recliner so bed slide behind him. Did hoyer lift transfer OOB to chair after standing trial and there ex. Overall good session with PT today.    Recommendations for follow up therapy are one component of a multi-disciplinary discharge planning process, led by the attending physician.  Recommendations may be updated based on patient status, additional functional criteria and insurance authorization.  Follow Up Recommendations  Skilled nursing-short term rehab (<3 hours/day)     Assistance Recommended at Discharge Frequent or constant Supervision/Assistance  Patient can return home with the following Two people to help with walking and/or transfers;Two people to help with bathing/dressing/bathroom;Assistance with cooking/housework;Assist for transportation;Help with stairs or ramp for entrance   Equipment Recommendations  None recommended by PT       Precautions / Restrictions Precautions Precautions: Fall Precaution Comments: cuffed trach/ trach collar Restrictions Weight Bearing Restrictions: No RLE Weight Bearing: Weight bearing as tolerated     Mobility  Bed Mobility Overal bed mobility: Needs Assistance Bed  Mobility: Supine to Sit Rolling: Min assist  Supine to sit: Min assist Sit to supine: Mod assist     Transfers Overall transfer level: Needs assistance Equipment used: Rolling walker (2 wheels) Transfers: Sit to/from Stand Sit to Stand: Min assist, +2 safety/equipment, From elevated surface      General transfer comment: pt stood 3 x EOB. 1 x 2 1/2 minutes. min assist from very elevated ebd height with 2nd person for safety. PT only required author to hold RW in place. He attempted taking steps to recliner however after taking 2 steps RLE and 1 step with LLE panics and require bed to be slide behne him versus turning the rest of the way to recliner. Overall pt putrs forth great effort throughout.    Ambulation/Gait    General Gait Details: did takle 2 steps with RLE and 1 step with LLE. RLE weaker than LLE   Balance Overall balance assessment: Needs assistance Sitting-balance support: Feet supported Sitting balance-[name] Scale: Good     Standing balance support: Bilateral upper extremity supported, Reliant on assistive device for balance Standing balance-[name] Scale: Fair Standing balance comment: heavy use of rolling walker for support in standing, nearly able to get hip/shoulders plumb with BOS - direct assist to get hips forward, able to maintain for <1 minute before hips start to shift back again      Cognition Arousal/Alertness: Awake/alert Behavior During Therapy: [facility] for tasks assessed/performed Overall Cognitive Status: Within Functional Limits for tasks assessed Area of Impairment: Safety/judgement, Problem solving      Following Commands: Follows one step commands consistently Safety/Judgement: Decreased awareness of safety, Decreased awareness of deficits     General Comments: Pt is A and O but still  lacks safety awareness and full scope of his deficits. " Im planning to go home new years."               Pertinent Vitals/Pain Pain Assessment Pain  Assessment: 0-10 Pain Score: 2  Pain Location: L hand and R hip pain, general LE numbness pain Pain Descriptors / Indicators: Burning     PT Goals (current goals can now be found in the care plan section) Acute Rehab PT Goals Patient Stated Goal: to go home with HHPT/OT Progress towards PT goals: Progressing toward goals    Frequency    Min 2X/week      PT Plan Current plan remains appropriate    Co-evaluation     PT goals addressed during session: Mobility/safety with mobility;Balance;Proper use of DME;Strengthening/ROM        AM-PAC PT "6 Clicks" Mobility   Outcome Measure  Help needed turning from your back to your side while in a flat bed without using bedrails?: A Little Help needed moving from lying on your back to sitting on the side of a flat bed without using bedrails?: A Little Help needed moving to and from a bed to a chair (including a wheelchair)?: A Lot Help needed standing up from a chair using your arms (e.g., wheelchair or bedside chair)?: A Lot Help needed to walk in hospital room?: Total Help needed climbing 3-5 steps with a railing? : Total 6 Click Score: 12    End of Session Equipment Utilized During Treatment: Oxygen (trach collar removed throughout session with PMV in place throughout) Activity Tolerance: Patient tolerated treatment well;Patient limited by fatigue Patient left: in chair;with call bell/phone within reach;with chair alarm set (after stabnding 3 x EOB and perform several exercises EOB, hoyer transferred OOB to recliner) Nurse Communication: Mobility status PT Visit Diagnosis: Unsteadiness on feet (R26.81);Muscle weakness (generalized) (M62.81);Difficulty in walking, not elsewhere classified (R26.2)     Time: 0208-0233 PT Time Calculation (min) (ACUTE ONLY): 25 min  Charges:  $Therapeutic Activity: 23-37 mins                    Julaine Fusi PTA 03/31/22, 3:23 PM

## 2022-03-31 NOTE — Progress Notes (Signed)
  Progress Note   Patient: Gary [name] QPY:[id number] DOB: 01-01-1956 DOA: 01/23/2022     56 DOS: the patient was seen and examined on 03/31/2022   Brief hospital course:  Gary [name] is a 66 y.o. male with medical history significant for diastolic CHF, chronic respiratory failure status post tracheostomy, COPD, essential hypertension, PUD and sleep apnea who presented to the emergency room with acute onset of worsening dyspnea.  Apparently patient has been in the ER for the past 10 days as he was unable to be cared at home specially requirement of tracheostomy care, awaiting placement. He developed worsening pulmonary edema. Being admitted for management of acute on chronic diastolic heart failure and given IV Lasix.  Patient condition has gradually improved.  He was able to tolerate nocturnal trilogy ventilation.  Currently pending nursing placement.  12/3-15: medically stable, waiting for placement  Assessment and Plan: * Acute on chronic diastolic (congestive) heart failure ([place]) Patient has 2(+) edema in lower extremities.   - Continue torsemide 20 mg daily.  Monitor volume status and electrolytes.  Hypokalemia, replaced: - Monitor K and Mg.  Acute on chronic respiratory failure with hypoxia ([name]) - Currently stable on baseline which required intermittent vent support with trach collar.  AKI (acute kidney injury) ([place]) - Creatinine increased from 1.19 to 1.42 mg/dL.  Monitor while on Torsemide.  Chronic obstructive pulmonary disease (COPD) ([name]) - Continue bronchodilators and home inhalers  GERD without esophagitis - continue PPI therapy.  Obesity, Class III, BMI 40-49.9 (morbid obesity) ([place]) Estimated body mass index is 51.75 kg/m as calculated from the following:   Height as of this encounter: '6\' 3"'$  (1.905 m).   Weight as of this encounter: 124.5 kg.   -Complicates prognosis  Anxiety and depression - continue Lexapro and Klonopin  Dyslipidemia - continue  statin therapy.       Subjective: Gary [name] is resting comfortably in the morning.  His legs are swollen.  We discussed his Torsemide.  Patient denies any chest pain or shortness of breath.  We also discussed his plan of care.   He would like to speak to social worker on Monday.  Physical Exam: Vitals:   03/29/22 2100 03/30/22 0753 03/30/22 2100 03/31/22 0748  BP: 106/67 106/69 110/76   Pulse: 82 79 80   Resp: (!) 22 (!) 22 15   Temp: 98.4 F (36.9 C) 98.3 F (36.8 C) 98 F (36.7 C) 98.3 F (36.8 C)  TempSrc: Oral Oral Oral   SpO2: 91% 90% 91%   Weight:      Height:       General exam: Appears agitated and frustrated. Morbidly obese Respiratory system: decreased breath sounds b/l  Cardiovascular system: S1/S2+. No rubs or clicks.  Gastrointestinal system: Abd is soft, NT, obese & normal bowel sounds. Central nervous system: Alert and awake. Moves all extremities  Psychiatry: judgement and insight appears at baseline.  Data Reviewed:  There are no new results to review at this time.  Family Communication: None  Disposition: Status is: Inpatient Remains inpatient appropriate because: Medically stable, waiting for placement  Planned Discharge Destination: Skilled nursing facility   DVT prophylaxis-Eliquis Time spent: 15 minutes  Author: George Hugh, MD 03/31/2022 6:38 PM  For on call review [email or web address].

## 2022-04-01 DIAGNOSIS — I5033 Acute on chronic diastolic (congestive) heart failure: Secondary | ICD-10-CM | POA: Diagnosis not present

## 2022-04-01 LAB — CBC
HCT: 37.4 % — ABNORMAL LOW (ref 39.0–52.0)
Hemoglobin: 10.3 g/dL — ABNORMAL LOW (ref 13.0–17.0)
MCH: 21.9 pg — ABNORMAL LOW (ref 26.0–34.0)
MCHC: 27.5 g/dL — ABNORMAL LOW (ref 30.0–36.0)
MCV: 79.6 fL — ABNORMAL LOW (ref 80.0–100.0)
Platelets: 204 10*3/uL (ref 150–400)
RBC: 4.7 MIL/uL (ref 4.22–5.81)
RDW: 19.5 % — ABNORMAL HIGH (ref 11.5–15.5)
WBC: 7.6 10*3/uL (ref 4.0–10.5)
nRBC: 0.3 % — ABNORMAL HIGH (ref 0.0–0.2)

## 2022-04-01 LAB — BASIC METABOLIC PANEL
Anion gap: 8 (ref 5–15)
BUN: 31 mg/dL — ABNORMAL HIGH (ref 8–23)
CO2: 29 mmol/L (ref 22–32)
Calcium: 8.4 mg/dL — ABNORMAL LOW (ref 8.9–10.3)
Chloride: 102 mmol/L (ref 98–111)
Creatinine, Ser: 1.34 mg/dL — ABNORMAL HIGH (ref 0.61–1.24)
GFR, Estimated: 59 mL/min — ABNORMAL LOW (ref >60)
Glucose, Bld: 141 mg/dL — ABNORMAL HIGH (ref 70–99)
Potassium: 3.5 mmol/L (ref 3.5–5.1)
Sodium: 139 mmol/L (ref 135–145)

## 2022-04-01 LAB — MAGNESIUM: Magnesium: 2.4 mg/dL (ref 1.7–2.4)

## 2022-04-01 NOTE — Progress Notes (Signed)
Called into room, patient shaving. He started coughing and patients trach popped out. This RN placed trach back into stoma. Patient was not in any distressed, oxygenation 96%.. Secured trach ties, changed inner cannula and dressing. Respitory called to assess. Continue to assess.

## 2022-04-01 NOTE — Progress Notes (Signed)
  Progress Note   Patient: Gary [name] JKD:[id number] DOB: [birth year] DOA: 01/23/2022     57 DOS: the patient was seen and examined on 04/01/2022   Brief hospital course:  Gary [name] is a 66 y.o. male with medical history significant for diastolic CHF, chronic respiratory failure status post tracheostomy, COPD, essential hypertension, PUD and sleep apnea who presented to the emergency room with acute onset of worsening dyspnea.  Apparently patient has been in the ER for the past 10 days as he was unable to be cared at home specially requirement of tracheostomy care, awaiting placement. He developed worsening pulmonary edema. Being admitted for management of acute on chronic diastolic heart failure and given IV Lasix.  Patient condition has gradually improved.  He was able to tolerate nocturnal trilogy ventilation.  Currently pending nursing placement.  12/3-17: medically stable, waiting for placement  Assessment and Plan: * Acute on chronic diastolic (congestive) heart failure ([place]) Patient has 2(+) edema in lower extremities.   - Continue torsemide 20 mg daily.  Monitor volume status and electrolytes.  Hypokalemia, replaced: - Monitor K and Mg.  Acute on chronic respiratory failure with hypoxia ([name]) - Currently stable on baseline which required intermittent vent support with trach collar.  AKI (acute kidney injury) ([place]) - Creatinine increased from 1.19 to 1.42 mg/dL.  Monitor while on Torsemide.  Chronic obstructive pulmonary disease (COPD) ([name]) - Continue bronchodilators and home inhalers  GERD without esophagitis - continue PPI therapy.  Obesity, Class III, BMI 40-49.9 (morbid obesity) ([place]) Estimated body mass index is 51.75 kg/m as calculated from the following:   Height as of this encounter: '6\' 3"'$  (1.905 m).   Weight as of this encounter: 099.8 kg.   -Complicates prognosis  Anxiety and depression - continue Lexapro and Klonopin  Dyslipidemia - continue  statin therapy.       Subjective: Gary [name] is resting comfortably in the morning.  He has a lot of questions regarding his discharge plan.  I explained that his placement is in process and we will discuss with our case management team tomorrow.  He has 2(+) edema in his legs.  We will continue his Torsemide.    Patient denies any chest pain or shortness of breath.  Physical Exam: Vitals:   03/30/22 2100 03/31/22 0748 03/31/22 2000 04/01/22 0717  BP: 110/76  124/72 105/75  Pulse: 80  87   Resp: 15  (!) 21 20  Temp: 98 F (36.7 C) 98.3 F (36.8 C) 98.2 F (36.8 C) 98.1 F (36.7 C)  TempSrc: Oral  Oral   SpO2: 91%  93% 95%  Weight:      Height:       General exam: Appears agitated and frustrated. Morbidly obese Respiratory system: decreased breath sounds b/l  Cardiovascular system: S1/S2+. No rubs or clicks.  Gastrointestinal system: Abd is soft, NT, obese & normal bowel sounds. Central nervous system: Alert and awake. Moves all extremities  Psychiatry: judgement and insight appears at baseline.  Data Reviewed:  There are no new results to review at this time.  Family Communication: None  Disposition: Status is: Inpatient Remains inpatient appropriate because: Medically stable, waiting for placement  Planned Discharge Destination: Skilled nursing facility   DVT prophylaxis-Eliquis Time spent: 15 minutes  Author: George Hugh, MD 04/01/2022 4:31 PM  For on call review [email or web address].

## 2022-04-02 DIAGNOSIS — I5033 Acute on chronic diastolic (congestive) heart failure: Secondary | ICD-10-CM | POA: Diagnosis not present

## 2022-04-02 DIAGNOSIS — J9621 Acute and chronic respiratory failure with hypoxia: Secondary | ICD-10-CM | POA: Diagnosis not present

## 2022-04-02 DIAGNOSIS — F419 Anxiety disorder, unspecified: Secondary | ICD-10-CM | POA: Diagnosis not present

## 2022-04-02 LAB — CBC
HCT: 36.2 % — ABNORMAL LOW (ref 39.0–52.0)
Hemoglobin: 9.9 g/dL — ABNORMAL LOW (ref 13.0–17.0)
MCH: 21.6 pg — ABNORMAL LOW (ref 26.0–34.0)
MCHC: 27.3 g/dL — ABNORMAL LOW (ref 30.0–36.0)
MCV: 79 fL — ABNORMAL LOW (ref 80.0–100.0)
Platelets: 213 10*3/uL (ref 150–400)
RBC: 4.58 MIL/uL (ref 4.22–5.81)
RDW: 19.6 % — ABNORMAL HIGH (ref 11.5–15.5)
WBC: 6.6 10*3/uL (ref 4.0–10.5)
nRBC: 0.3 % — ABNORMAL HIGH (ref 0.0–0.2)

## 2022-04-02 LAB — BASIC METABOLIC PANEL
Anion gap: 4 — ABNORMAL LOW (ref 5–15)
BUN: 31 mg/dL — ABNORMAL HIGH (ref 8–23)
CO2: 30 mmol/L (ref 22–32)
Calcium: 8.6 mg/dL — ABNORMAL LOW (ref 8.9–10.3)
Chloride: 108 mmol/L (ref 98–111)
Creatinine, Ser: 1.27 mg/dL — ABNORMAL HIGH (ref 0.61–1.24)
GFR, Estimated: >60 mL/min (ref >60)
Glucose, Bld: 106 mg/dL — ABNORMAL HIGH (ref 70–99)
Potassium: 4.2 mmol/L (ref 3.5–5.1)
Sodium: 142 mmol/L (ref 135–145)

## 2022-04-02 LAB — MAGNESIUM: Magnesium: 2.5 mg/dL — ABNORMAL HIGH (ref 1.7–2.4)

## 2022-04-02 MED ORDER — MINERAL OIL LIGHT OIL
TOPICAL_OIL | Freq: Once | Status: AC
  Administered 2022-04-02: 1 via TOPICAL
  Filled 2022-04-02: qty 10

## 2022-04-02 NOTE — Progress Notes (Signed)
Refused lab work states he only gets labs done every other day

## 2022-04-02 NOTE — Progress Notes (Signed)
Physical Therapy Treatment Patient Details Name: Gary [name] MRN: [id number] DOB: [birth year] Today's Date: 04/02/2022   History of Present Illness Pt is a 66 y.o. 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  Prolonged admittances earlier this year for similar. MD assessment includes: Acute on chronic diastolic CHF, AKI, and acute on chronic respiratory failure with hypoxia.    PT Comments    Pt was pleasant and motivated to participate during the session and put forth good effort throughout. Pt required only minimal assistance for LLE management to go from sup to sit and once in sitting demonstrated good static sitting balance.  Pt requested the EOB to be significantly elevated before he would attempt to stand.  Pt encouraged to attempt to stand from the EOB at lower positions and elevate as needed for increased intensity/strength training but also to progress towards more functional transfers with pt declining to attempt.  With bed elevated pt was able to come to standing multiple times without physical assist and was able to take several side steps at the EOB with the BRW.  No adverse symptoms reported during the session.  Pt will benefit from PT services in a SNF setting upon discharge to safely address deficits listed in patient problem list for decreased caregiver assistance and eventual return to PLOF.     Recommendations for follow up therapy are one component of a multi-disciplinary discharge planning process, led by the attending physician.  Recommendations may be updated based on patient status, additional functional criteria and insurance authorization.  Follow Up Recommendations  Skilled nursing-short term rehab (<3 hours/day) Can patient physically be transported by private vehicle: No   Assistance Recommended at Discharge Frequent or constant Supervision/Assistance   Patient can return home with the following Two people to help with walking and/or transfers;Two people to help with bathing/dressing/bathroom;Assistance with cooking/housework;Assist for transportation;Help with stairs or ramp for entrance   Equipment Recommendations  None recommended by PT    Recommendations for Other Services       Precautions / Restrictions Precautions Precautions: Fall Precaution Comments: cuffed trach/ trach collar Restrictions Weight Bearing Restrictions: No     Mobility  Bed Mobility Overal bed mobility: Needs Assistance Bed Mobility: Supine to Sit, Sit to Supine     Supine to sit: Min assist Sit to supine: Mod assist, +2 for physical assistance   General bed mobility comments: Min A for LLE management during sup to sit and +2 mod A for sit to sup for BLE and trunk control    Transfers Overall transfer level: Needs assistance Equipment used: Rolling walker (2 wheels) Transfers: Sit to/from Stand Sit to Stand: Min guard, +2 safety/equipment, From elevated surface           General transfer comment: Pt did not require physical assist to stand but would not attempt to stand without the bed significantly elevated despite encouragement to attempt at lower heights and adjust up as needed    Ambulation/Gait Ambulation/Gait assistance: Min guard, +2 safety/equipment Gait Distance (Feet): 3 Feet Assistive device: Rolling walker (2 wheels) Gait Pattern/deviations: Step-to pattern, Trunk flexed, Decreased step length - right, Decreased step length - left Gait velocity: decreased     General Gait Details: Pt able to take 4-5 small lateral steps at the EOB before fatiguing and needing to return to sitting   Stairs  Wheelchair Mobility    Modified Rankin (Stroke Patients Only)       Balance Overall balance assessment: Needs assistance Sitting-balance support: Feet supported Sitting balance-[name] Scale: Good     Standing  balance support: Bilateral upper extremity supported, Reliant on assistive device for balance, During functional activity Standing balance-[name] Scale: Fair Standing balance comment: Heavy lean on the RW for support in standing                            Cognition Arousal/Alertness: Awake/alert Behavior During Therapy: WFL for tasks assessed/performed Overall Cognitive Status: Within Functional Limits for tasks assessed                                          Exercises Total Joint Exercises Ankle Circles/Pumps: Strengthening, 10 reps, Both Hip ABduction/ADduction: AAROM, Strengthening, 10 reps, Both Long Arc Quad: AROM, Strengthening, Both, 10 reps Knee Flexion: AROM, Strengthening, Both, 10 reps Other Exercises Other Exercises: Static standing at EOB 2 x 60 sec    General Comments        Pertinent Vitals/Pain Pain Assessment Pain Assessment: PAINAD Breathing: normal Negative Vocalization: none Facial Expression: smiling or inexpressive Body Language: relaxed Consolability: no need to console PAINAD Score: 0 Pain Location: L hand Pain Descriptors / Indicators: Sore Pain Intervention(s): Repositioned, Monitored during session, Premedicated before session    Home Living                          Prior Function            PT Goals (current goals can now be found in the care plan section) Progress towards PT goals: Progressing toward goals    Frequency    Min 2X/week      PT Plan Current plan remains appropriate    Co-evaluation              AM-PAC PT "6 Clicks" Mobility   Outcome Measure  Help needed turning from your back to your side while in a flat bed without using bedrails?: A Little Help needed moving from lying on your back to sitting on the side of a flat bed without using bedrails?: A Little Help needed moving to and from a bed to a chair (including a wheelchair)?: A Lot Help needed standing up from a  chair using your arms (e.g., wheelchair or bedside chair)?: A Lot Help needed to walk in hospital room?: Total Help needed climbing 3-5 steps with a railing? : Total 6 Click Score: 12    End of Session Equipment Utilized During Treatment: Gait belt;Oxygen Activity Tolerance: Patient tolerated treatment well Patient left: in bed;with call bell/phone within reach;with nursing/sitter in room Nurse Communication: Mobility status PT Visit Diagnosis: Unsteadiness on feet (R26.81);Muscle weakness (generalized) (M62.81);Difficulty in walking, not elsewhere classified (R26.2)     Time: 1937-9024 PT Time Calculation (min) (ACUTE ONLY): 29 min  Charges:  $Therapeutic Exercise: 8-22 mins $Therapeutic Activity: 8-22 mins                    D. Scott Candyce Gambino PT, DPT 04/02/22, 2:49 PM

## 2022-04-02 NOTE — Progress Notes (Signed)
Mobility Specialist - Progress Note   04/02/22 1600  Mobility  Range of Motion/Exercises Right leg;Left leg  $Mobility charge 1 Mobility     Pt agreeable to activity. Participated in bed-level theraband exercises targeting quads 3x10 each exercise. Pt left in bed with needs in reach.    Kathee Delton Mobility Specialist 04/02/22, 4:24 PM

## 2022-04-02 NOTE — Progress Notes (Signed)
  Progress Note   Patient: Gary [name] DJM:[id number] DOB: [birth year] DOA: 01/23/2022     58 DOS: the patient was seen and examined on 04/02/2022   Brief hospital course:  Gary [name] is a 66 y.o. male with medical history significant for diastolic CHF, chronic respiratory failure status post tracheostomy, COPD, essential hypertension, PUD and sleep apnea who presented to the emergency room with acute onset of worsening dyspnea.  Apparently patient has been in the ER for the past 10 days as he was unable to be cared at home specially requirement of tracheostomy care, awaiting placement. He developed worsening pulmonary edema. Being admitted for management of acute on chronic diastolic heart failure and given IV Lasix.  Patient condition has gradually improved.  He was able to tolerate nocturnal trilogy ventilation.  Currently pending nursing placement.  12/03-18: medically stable, waiting for placement  Assessment and Plan: Acute on chronic diastolic (congestive) heart failure ([place]): Appears euvolemic now. - Continue torsemide 20 mg daily.  Monitor volume status and electrolytes.  Hypokalemia: Resolved.  Acute on chronic respiratory failure with hypoxia ([place]): stable on trach collar.  AKI: Improved. Recent Labs    02/20/22 0857 02/23/22 0429 02/25/22 0851 03/02/22 0459 03/18/22 0415 03/22/22 0433 03/27/22 1608 03/31/22 0903 04/01/22 0946 04/02/22 0704  BUN 36* 36* 46* 43* 38* 26* 30* 30* 31* 31*  CREATININE 1.45* 1.44* 1.30* 1.24 1.40* 1.19 1.19 1.42* 1.34* 1.27*  -Monitor intermittently  Chronic obstructive pulmonary disease: -Continue bronchodilators and home inhalers  GERD without esophagitis -continue PPI therapy.  Morbid obesity Body mass index is 51.75 kg/m.  Anxiety and depression -continue Lexapro and Klonopin  Dyslipidemia -continue statin therapy.  Subjective:  Seen and examined earlier this morning.  No major events overnight of this morning.   Reports some itching in his scalp that he attributes to dry scalp.  Likes to try some mineral oil.   Physical Exam: Vitals:   03/31/22 0748 03/31/22 2000 04/01/22 0717 04/02/22 0851  BP:  124/72 105/75 114/70  Pulse:  87  83  Resp:  (!) 21 20 (!) 22  Temp: 98.3 F (36.8 C) 98.2 F (36.8 C) 98.1 F (36.7 C) 98.1 F (36.7 C)  TempSrc:  Oral    SpO2:  93% 95% 94%  Weight:      Height:       GENERAL: No apparent distress.  Nontoxic. HEENT: MMM.  Vision and hearing grossly intact.  NECK: Tracheostomy in place. RESP:  No IWOB.  Fair aeration bilaterally. CVS:  RRR. Heart sounds normal.  ABD/GI/GU: BS+. Abd soft, NTND.  MSK/EXT:  Moves extremities. No apparent deformity.  Trace BLE edema. SKIN: no apparent skin lesion or wound NEURO: Awake and alert. Oriented appropriately.  No apparent focal neuro deficit. PSYCH: Calm. Normal affect.    Family Communication: None at bedside.  Disposition: Status is: Inpatient Remains inpatient appropriate because: Medically stable, waiting for placement  Planned Discharge Destination: Skilled nursing facility   DVT prophylaxis-Eliquis   Author: Mercy Riding, MD 04/02/2022 3:36 PM  For on call review [email or web address].

## 2022-04-03 DIAGNOSIS — I5033 Acute on chronic diastolic (congestive) heart failure: Secondary | ICD-10-CM | POA: Diagnosis not present

## 2022-04-03 NOTE — TOC Progression Note (Signed)
Transition of Care [facility]) - Progression Note    Patient Details  Name: Gary [name] MRN: [id number] Date of Birth: November 19, 1955  [facility]) CM/SW Contact  Shelbie Hutching, RN Phone Number: 04/03/2022, 3:49 PM  Clinical Narrative:    Met with patient at the bedside this afternoon.  Patient is very frustrated with still being in the hospital, TOC has been unable to find patient a SNF bed.  Patient would like to go home, he says that he can go to his home, his girlfriend will be there to help take care of him and his sister will also be coming in  to assist as well.  Patient would like to get [facility] services arranged for PT and OT so he can continue to work on his strength training and mobility.  He has a hospital bed, wheelchair, walker, hoyer lift, bariatric walker, oxygen and trilogy machine at home. Patient would like to use [facility], referral given to [facility] with [facility].  RNCM  called Carol Ada with [facility] Medicaid to start working on [employer].  She emailed a list of information needed for the authorization.  Requested information SECURE emailed back.  Looking to hopefully DC on Friday 12/22- Sherry with [facility] [phone].   Expected Discharge Plan: [place] Barriers to Discharge: Continued Medical Work up, Ship broker, Unsafe home situation  Expected Discharge Plan and Services Expected Discharge Plan: [place] In-house Referral: Clinical Social Work Discharge Planning Services: CM Consult Post Acute Care Choice: [place] arrangements for the past 2 months: [place]: PT, OT [place] Agency: [place] ([facility]) Date [place]: 04/03/22 Time [place]: 1548 Representative spoke with at [place]: [place] ([place]) Interventions    Readmission Risk Interventions    12/31/2021    8:58 AM 12/20/2021     4:21 PM 05/04/2021    1:26 PM  Readmission Risk Prevention Plan  Transportation Screening Complete Complete Complete  Medication Review Press photographer) Complete Complete Complete  PCP or Specialist appointment within 3-5 days of discharge Complete Complete Complete  HRI or [place] Complete Complete Complete  SW Recovery Care/Counseling Consult Complete Complete Complete  Palliative Care Screening Complete Complete Not [place] Not Applicable Not Applicable Not Applicable

## 2022-04-03 NOTE — Progress Notes (Signed)
Occupational Therapy Treatment Patient Details Name: Gary [name] MRN: [id number] DOB: [birth year] Today's Date: 04/03/2022   History of present illness Pt is a 66 y.o. 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  Prolonged admittances earlier this year for similar. MD assessment includes: Acute on chronic diastolic CHF, AKI, and acute on chronic respiratory failure with hypoxia.   OT comments  Mr Odette was seen for OT treatment on this date. Upon arrival to room pt reclined in bed, agreeable to tx. Pt requires MIN A exit bed, good sitting balance. CGA x2 + RW sit<>stand x3 from max elevated bed height - greatly improved technique and anterior weight shifting noted. Stands for 2 min 10 sec, 70 sec, and 1 min. Completes bed>chair step pivot t/f with MIN A for RW mgmt and cues. Pt making good progress toward goals, will continue to follow POC. Discharge recommendation remains appropriate.     Recommendations for follow up therapy are one component of a multi-disciplinary discharge planning process, led by the attending physician.  Recommendations may be updated based on patient status, additional functional criteria and insurance authorization.    Follow Up Recommendations  Skilled nursing-short term rehab (<3 hours/day)     Assistance Recommended at Discharge Frequent or constant Supervision/Assistance  Patient can return home with the following  Two people to help with walking and/or transfers;Two people to help with bathing/dressing/bathroom   Equipment Recommendations  Hospital bed    Recommendations for Other Services      Precautions / Restrictions Precautions Precautions: Fall Restrictions Weight Bearing Restrictions: No       Mobility Bed Mobility Overal bed mobility: Needs Assistance Bed Mobility: Supine to Sit Rolling: Min assist               Transfers Overall transfer level: Needs assistance Equipment used: Rolling walker (2 wheels) Transfers: Sit to/from Stand Sit to Stand: Min guard, +2 safety/equipment, From elevated surface     Step pivot transfers: Min assist, From elevated surface, +2 safety/equipment           Balance Overall balance assessment: Needs assistance Sitting-balance support: Feet supported Sitting balance-[name] Scale: Good     Standing balance support: Single extremity supported, During functional activity Standing balance-[name] Scale: Fair                             ADL either performed or assessed with clinical judgement   ADL Overall ADL's : Needs assistance/impaired                                       General ADL Comments: MAX A don B shoes. MIN A + RW for simulated BSC t/f     Cognition Arousal/Alertness: Awake/alert Behavior During Therapy: WFL for tasks assessed/performed Overall Cognitive Status: Within Functional Limits for tasks assessed                                                     Pertinent Vitals/ Pain       Pain Assessment Pain Assessment: No/denies pain   Frequency  Min 2X/week  Progress Toward Goals  OT Goals(current goals can now be found in the care plan section)  Progress towards OT goals: Progressing toward goals  Acute Rehab OT Goals Patient Stated Goal: to go home OT Goal Formulation: With patient Time For Goal Achievement: 04/17/22 Potential to Achieve Goals: Fair ADL Goals Pt Will Perform Grooming: with modified independence;sitting Pt Will Perform Lower Body Dressing: sitting/lateral leans;with min assist Pt Will Transfer to Toilet: with min assist;bedside commode;stand pivot transfer  Plan Discharge plan remains appropriate;Frequency remains appropriate    Co-evaluation                 AM-PAC OT "6 Clicks" Daily Activity     Outcome Measure   Help from another  person eating meals?: None Help from another person taking care of personal grooming?: A Little Help from another person toileting, which includes using toliet, bedpan, or urinal?: A Lot Help from another person bathing (including washing, rinsing, drying)?: A Lot Help from another person to put on and taking off regular upper body clothing?: A Little Help from another person to put on and taking off regular lower body clothing?: A Lot 6 Click Score: 16    End of Session    OT Visit Diagnosis: Other abnormalities of gait and mobility (R26.89);Muscle weakness (generalized) (M62.81)   Activity Tolerance Patient tolerated treatment well   Patient Left in chair;with call bell/phone within reach   Nurse Communication Mobility status        Time: 8325-4982 OT Time Calculation (min): 29 min  Charges: OT General Charges $OT Visit: 1 Visit OT Treatments $Self Care/Home Management : 8-22 mins $Therapeutic Activity: 8-22 mins  [name], M.S. OTR/L  04/03/22, 2:57 PM  ascom [phone]

## 2022-04-03 NOTE — Progress Notes (Addendum)
Mobility Specialist - Progress Note   04/03/22 1600  Mobility  Activity Transferred from chair to bed  Assistive Device Overhead trapeze  Distance Ambulated (ft) 0 ft  Range of Motion/Exercises Right leg;Left leg  Activity Response Tolerated well  $Mobility charge 1 Mobility     Pt sitting in chair upon arrival, utilizing 5L trach. Participated in theraband exercises 3x10 before transferring chair-bed via overhead lift. Rolled L/R minA; able to achieve [facility] positioning using trendelenburg feature. Pt left in bed with needs in reach.    Gary [name] Mobility Specialist 04/03/22, 4:36 PM

## 2022-04-03 NOTE — Progress Notes (Signed)
  PROGRESS NOTE    Gary [name]  WGN:[id number] DOB: 10-30-1955 DOA: 01/23/2022 PCP: [facility]  [id number]  LOS: 59 days   Brief hospital course:   Assessment & Plan: Gary [name] is a 66 y.o. male with medical history significant for diastolic CHF, chronic respiratory failure status post tracheostomy, COPD, essential hypertension, PUD and sleep apnea who presented to the emergency room with acute onset of worsening dyspnea.  Apparently patient has been in the ER for the past 10 days as he was unable to be cared at home specially requirement of tracheostomy care, awaiting placement. He developed worsening pulmonary edema. Being admitted for management of acute on chronic diastolic heart failure and given IV Lasix.  Patient condition has gradually improved.  He was able to tolerate nocturnal trilogy ventilation.  Currently pending nursing placement.  Acute on chronic diastolic (congestive) heart failure ([place]):  Appears euvolemic now. - Continue torsemide 20 mg daily.     Hypokalemia: Resolved.   Acute on chronic respiratory failure with hypoxia ([place]):  stable on trach collar.   AKI: Improved. -Monitor intermittently   Chronic obstructive pulmonary disease: -Continue bronchodilators and home inhalers   GERD without esophagitis -continue PPI therapy.   Morbid obesity Body mass index is 51.75 kg/m.   Anxiety and depression -continue Lexapro and Klonopin   Dyslipidemia -continue statin therapy.    DVT prophylaxis: HQ:[id number] Code Status: Full code  Family Communication:  Level of care: Progressive Dispo:   The patient is from: home Anticipated d/c is to: home.  After a prolonged search for SNF, pt decided to go home today. Anticipated d/c date is: Friday   Subjective and Interval History:  Pt said today that he wants to go home.  He said he has "given [place] enough time" to find him a SNF, now he just wants to go home.  Requested [facility] to  [facility] about HH.   Objective: Vitals:   04/01/22 0717 04/02/22 0851 04/02/22 1930 04/03/22 1114  BP: 105/75 114/70 112/86 120/77  Pulse:  83 79   Resp: 20 (!) '22 18 18  '$ Temp: 98.1 F (36.7 C) 98.1 F (36.7 C) 98.2 F (36.8 C) 97.8 F (36.6 C)  TempSrc:   Oral Oral  SpO2: 95% 94% 95% 93%  Weight:      Height:        Intake/Output Summary (Last 24 hours) at 04/03/2022 1619 Last data filed at 04/03/2022 1500 Gross per 24 hour  Intake 880 ml  Output 2750 ml  Net -1870 ml   Filed Weights   03/24/22 0500 03/25/22 0459 03/28/22 0500  Weight: (!) 191 kg (!) 196 kg (!) 187.8 kg    Examination:   Constitutional: NAD, AAOx3 HEENT: conjunctivae and lids normal, EOMI CV: No cyanosis.   RESP: normal respiratory effort, trach collar present SKIN: warm, dry Neuro: II - XII grossly intact.   Psych: Normal mood and affect.  Appropriate judgement and reason   Data Reviewed: I have personally reviewed labs and imaging studies  Time spent: 25 minutes  Enzo Bi, MD [facility] If 7PM-7AM, please contact night-coverage 04/03/2022, 4:19 PM

## 2022-04-04 DIAGNOSIS — I5033 Acute on chronic diastolic (congestive) heart failure: Secondary | ICD-10-CM | POA: Diagnosis not present

## 2022-04-04 MED ORDER — POLYETHYLENE GLYCOL 3350 17 G PO PACK: 34.0000 g | PACK | Freq: Every day | ORAL | Status: DC | PRN

## 2022-04-04 MED ORDER — LACTULOSE 10 GM/15ML PO SOLN: 20.0000 g | Freq: Every day | ORAL | Status: DC | PRN

## 2022-04-04 NOTE — Progress Notes (Signed)
Physical Therapy Treatment Patient Details Name: Gary [name] MRN: [id number] DOB: 15-Sep-1955 Today's Date: 04/04/2022   History of Present Illness Pt is a 66 y.o. 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  Prolonged admittances earlier this year for similar. MD assessment includes: Acute on chronic diastolic CHF, AKI, and acute on chronic respiratory failure with hypoxia.    PT Comments    Pt did well today, shows good motivation and in good spirits.  He continues to need min assist to get to sitting and mod assist (mostly with LEs) to get back to supine but needed less cuing, showed more initiation and generally did better than this PT has seen from him in numerous prior sessions.  Pt was able to do 3 separate standing efforts (2:15, 1:45, ~30 seconds) but did not once need assist to shift weight forward or stabilize.  Pt still needing plenty of encouragement and cuing but was much more willing to self initiate movement today.  Pt also willing to try rising to standing from bed hight lower than full elevation, and did so w/ good forward weight shift and effort though U&LEs to get to fully upright. Pt making good gains, continue PT per POC.  Recommendations for follow up therapy are one component of a multi-disciplinary discharge planning process, led by the attending physician.  Recommendations may be updated based on patient status, additional functional criteria and insurance authorization.  Follow Up Recommendations  Skilled nursing-short term rehab (<3 hours/day) Can patient physically be transported by private vehicle: No   Assistance Recommended at Discharge Frequent or constant Supervision/Assistance  Patient can return home with the following Two people to help with walking and/or transfers;Two people to help with bathing/dressing/bathroom;Assistance with  cooking/housework;Assist for transportation;Help with stairs or ramp for entrance   Equipment Recommendations  None recommended by PT    Recommendations for Other Services       Precautions / Restrictions Precautions Precautions: Fall Precaution Comments: cuffed trach/ trach collar Restrictions Weight Bearing Restrictions: No     Mobility  Bed Mobility Overal bed mobility: Needs Assistance Bed Mobility: Supine to Sit, Sit to Supine     Supine to sit: Min assist (able to get LEs to EOB, HHA to pull trunk up to sitting) Sit to supine: Mod assist (with LEs back up into bed)        Transfers Overall transfer level: Needs assistance Equipment used: Rolling walker (2 wheels) Transfers: Sit to/from Stand Sit to Stand: Min guard, From elevated surface           General transfer comment: Again did not require physical assist to stand but would not attempt to stand without the bed significantly elevated.  On second standing effort from 36" bed height, pt with more labored effort but able to push to to standing from lower setting than typically has been willing to try.    Ambulation/Gait               General Gait Details: Pt able to take ~10 small R side steps at the EOB before fatiguing and needing to return to sitting   Stairs             Wheelchair Mobility    Modified Rankin (Stroke Patients Only)       Balance Overall balance assessment: Needs assistance Sitting-balance support: Feet supported Sitting balance-[name] Scale: Good Sitting balance -  Comments: Pt able to maintain sitting relativley well with light UE use     Standing balance-[name] Scale: Fair Standing balance comment: Heavy lean on the RW for support in standing but did not need any direct assist to maintain  standing                            Cognition Arousal/Alertness: Awake/alert Behavior During Therapy: WFL for tasks assessed/performed Overall Cognitive Status:  Within Functional Limits for tasks assessed                                 General Comments: Pt much more motivated and eager to work that he was with this therapist last week        Exercises      General Comments General comments (skin integrity, edema, etc.): Per his usual pt needs extra time between efforts, was willing to try standing from slightly lower bed height (Still elevated at 36").  Pt needed less cuing and encouragement to initiate movement this date      Pertinent Vitals/Pain Pain Assessment Faces Pain Scale: Hurts a little bit Pain Location: L hand    Home Living                          Prior Function            PT Goals (current goals can now be found in the care plan section) Progress towards PT goals: Progressing toward goals    Frequency    Min 2X/week      PT Plan Current plan remains appropriate    Co-evaluation              AM-PAC PT "6 Clicks" Mobility   Outcome Measure  Help needed turning from your back to your side while in a flat bed without using bedrails?: A Little Help needed moving from lying on your back to sitting on the side of a flat bed without using bedrails?: A Little Help needed moving to and from a bed to a chair (including a wheelchair)?: A Lot Help needed standing up from a chair using your arms (e.g., wheelchair or bedside chair)?: A Little Help needed to walk in hospital room?: Total Help needed climbing 3-5 steps with a railing? : Total 6 Click Score: 13    End of Session Equipment Utilized During Treatment: Gait belt;Oxygen Activity Tolerance: Patient tolerated treatment well;Patient limited by fatigue Patient left: in bed;with call bell/phone within reach   PT Visit Diagnosis: Unsteadiness on feet (R26.81);Muscle weakness (generalized) (M62.81);Difficulty in walking, not elsewhere classified (R26.2)     Time: 0076-2263 PT Time Calculation (min) (ACUTE ONLY): 42 min  Charges:   $Therapeutic Exercise: 8-22 mins $Therapeutic Activity: 23-37 mins                     Kreg Shropshire, DPT 04/04/2022, 5:12 PM

## 2022-04-04 NOTE — Progress Notes (Signed)
2200 patient alert x4 able to make all needs known, refused gabapentin, ammonium lactate, and voltaren

## 2022-04-04 NOTE — Progress Notes (Signed)
  PROGRESS NOTE    Gary [name]  GNF:[id number] DOB: [birth year] DOA: 01/23/2022 [facility]CP: [facility]  [id number]/[id number]-[id number]  LOS: 60 days   Brief hospital course:   Assessment & Plan: Gary [name] is a 66 y.o. male with medical history significant for diastolic CHF, chronic respiratory failure status post tracheostomy, COPD, essential hypertension, PUD and sleep apnea who presented to the emergency room with acute onset of worsening dyspnea.  Apparently patient has been in the ER for the past 10 days as he was unable to be cared at home specially requirement of tracheostomy care, awaiting placement. He developed worsening pulmonary edema. Being admitted for management of acute on chronic diastolic heart failure and given IV Lasix.  Patient condition has gradually improved.  He was able to tolerate nocturnal trilogy ventilation.  Currently pending nursing placement.  Acute on chronic hypoxic and hypercapnic respiratory failure secondary to advanced COPD Tracheostomy tube dependence Hx of frequent intubation Trach exchange performed on 11/14.   Acute on chronic diastolic (congestive) heart failure ([place]):  Appears euvolemic now. - Continue torsemide 20 mg daily.     Hypokalemia --cont daily potassium supplement.   AKI: Improved. -Monitor intermittently   Chronic obstructive pulmonary disease: --Pt has been refusing Combivent   GERD without esophagitis --pt has been refusing PPI   Morbid obesity Body mass index is 51.75 kg/m.   Anxiety and depression -pt has been refusing Lexapro    Dyslipidemia -continue statin therapy.  Microcytic normochromic anemia Iron deficiency anemia. Continue iron supplementation. s/p IV iron x3 doses  Right knee pain secondary to lateral and medial meniscal tear. MRI knee confirms. --ortho consulted, most likely require a total knee replacement to address both the meniscal pathology and the degenerative changes, however, pt  currently is not a surgical candidate. --s/p Kenalog injection on 11/10 with improved symptoms.    DVT prophylaxis: [id number] Code Status: Full code  Family Communication:  Level of care: Progressive Dispo:   The patient is from: home Anticipated d/c is to: home.  After a prolonged search for SNF, pt decided to go home. Anticipated d/c date is: Friday   Subjective and Interval History:  No change today.  TOC to set up for [facility] for discharge home.   Objective: Vitals:   04/02/22 1930 04/03/22 1114 04/03/22 1932 04/04/22 1300  BP: 112/86 120/77 109/70 111/78  Pulse: 79  80 88  Resp: '18 18 18 18  '$ Temp: 98.2 F (36.8 C) 97.8 F (36.6 C) 98.8 F (37.1 C) 98.7 F (37.1 C)  TempSrc: Oral Oral Oral Oral  SpO2: 95% 93% 93% 95%  Weight:      Height:        Intake/Output Summary (Last 24 hours) at 04/04/2022 1659 Last data filed at 04/04/2022 8413 Gross per 24 hour  Intake 360 ml  Output 1150 ml  Net -790 ml   Filed Weights   03/24/22 0500 03/25/22 0459 03/28/22 0500  Weight: (!) 191 kg (!) 196 kg (!) 187.8 kg    Examination:   Constitutional: NAD CV: No cyanosis.   RESP: normal respiratory effort, on RA   Data Reviewed: I have personally reviewed labs and imaging studies  Time spent: 25 minutes  Enzo Bi, MD [facility] If 7PM-7AM, please contact night-coverage 04/04/2022, 4:59 PM

## 2022-04-04 NOTE — TOC Progression Note (Signed)
Transition of Care [facility]) - Progression Note    Patient Details  Name: Gary [name] MRN: [id number] Date of Birth: 1955-05-03  Transition of [facility]) CM/SW Contact  Shelbie Hutching, RN Phone Number: 04/04/2022, 2:49 PM  Clinical Narrative:     Carol Ada with [facility] [employer] called to finalize information for home care services.  [facility] is unable to provide home health services.  RNCM checking with [facility] to see if they may be able to accept for PT and OT at home.     Barriers to Discharge: Continued Medical Work up, Ship broker, Unsafe home situation  Expected Discharge Plan and Services In-house Referral: Clinical Social Work Discharge Planning Services: [place] arrangements for the past 2 months: [place]: PT, OT [place] Agency: [place] ([place]) Date [place]: 04/03/22 Time [place]: 1548 Representative spoke with at [place]: [place] Determinants of Health ([place]) Interventions [place]: No Food Insecurity (02/05/2022)  Housing: Low Risk  (02/05/2022)  Transportation Needs: Unmet Transportation Needs (02/05/2022)  Utilities: At Risk (02/05/2022)  Depression (PHQ2-9): Low Risk  (06/07/2021)  Financial Resource Strain: Medium Risk (05/03/2017)  Physical Activity: Insufficiently Active (05/03/2017)  Social Connections: Moderately Integrated (05/03/2017)  Stress: No Stress Concern Present (05/03/2017)  Tobacco Use: Medium Risk (02/27/2022)    Readmission Risk Interventions    12/31/2021    8:58 AM 12/20/2021    4:21 PM 05/04/2021    1:26 PM  Readmission Risk Prevention Plan  Transportation Screening Complete Complete Complete  Medication Review Press photographer) Complete Complete Complete  PCP or Specialist appointment within 3-5 days of discharge Complete Complete Complete  HRI or  Home Care Consult Complete Complete Complete  SW Recovery Care/Counseling Consult Complete Complete Complete  Palliative Care Screening Complete Complete Not [place] Not Applicable Not Applicable Not Applicable

## 2022-04-05 DIAGNOSIS — I5033 Acute on chronic diastolic (congestive) heart failure: Secondary | ICD-10-CM | POA: Diagnosis not present

## 2022-04-05 NOTE — TOC Progression Note (Signed)
Transition of Care [facility]) - Progression Note    Patient Details  Name: Gary [name] MRN: [id number] Date of Birth: 09/06/1955  Transition of Care [facility]) CM/SW Contact  Shelbie Hutching, RN Phone Number: 04/05/2022, 4:27 PM  Clinical Narrative:    [place] will be providing 18.5 hours of CNA coverage for patient during the week with CNA's going out 7 days per week.  [facility] [phone].  Alvis Lemmings has agreed to accept patient for PT and OT but will not be able to see the patient until next week.  Updated patient, he is in agreement with discharge for tomorrow.  Set up EMS transport for around 11 am.       Barriers to Discharge: Continued Medical Work up, Ship broker, Unsafe home situation  Expected Discharge Plan and Services In-house Referral: Clinical Social Work Discharge Planning Services: [place] arrangements for the past 2 months: [place]: PT, OT [place] Agency: [place] ([place]) Date [place]: 04/03/22 Time [place]: 1548 Representative spoke with at [place]: [place] Determinants of Health ([place]) Interventions [place]: No Food Insecurity (02/05/2022)  Housing: Low Risk  (02/05/2022)  Transportation Needs: Unmet Transportation Needs (02/05/2022)  Utilities: At Risk (02/05/2022)  Depression (PHQ2-9): Low Risk  (06/07/2021)  Financial Resource Strain: Medium Risk (05/03/2017)  Physical Activity: Insufficiently Active (05/03/2017)  Social Connections: Moderately Integrated (05/03/2017)  Stress: No Stress Concern Present (05/03/2017)  Tobacco Use: Medium Risk (02/27/2022)    Readmission Risk Interventions    12/31/2021    8:58 AM 12/20/2021    4:21 PM 05/04/2021    1:26 PM  Readmission Risk Prevention Plan  Transportation Screening Complete Complete Complete  Medication  Review Press photographer) Complete Complete Complete  PCP or Specialist appointment within 3-5 days of discharge Complete Complete Complete  HRI or Home Care Consult Complete Complete Complete  SW Recovery Care/Counseling Consult Complete Complete Complete  Palliative Care Screening Complete Complete Not [place] Not Applicable Not Applicable Not Applicable

## 2022-04-05 NOTE — Progress Notes (Signed)
Pt alert, oriented, Trach collar 5/28. Denies pain. Was updated about possibility going home tomorrow. Seems sad, but states "I am fine".

## 2022-04-05 NOTE — Progress Notes (Signed)
Mobility Specialist - Progress Note   04/05/22 1600  Mobility  Activity Refused mobility     Pt politely declined mobility at this time. Pt eating on arrival and conversing with family at bedside. Will attempt another date/time.    Kathee Delton Mobility Specialist 04/05/22, 4:12 PM

## 2022-04-05 NOTE — Progress Notes (Signed)
  PROGRESS NOTE    Gary [name]  QMG:[id number] DOB: 05/13/1955 DOA: 01/23/2022 PCP: [facility] [place]  [id number]/[id number]  LOS: 61 days   Brief hospital course:   Assessment & Plan: Gary [name] is a 66 y.o. male with medical history significant for diastolic CHF, chronic respiratory failure status post tracheostomy, COPD, essential hypertension, PUD and sleep apnea who presented to the emergency room with acute onset of worsening dyspnea.  Apparently patient has been in the ER for the past 10 days as he was unable to be cared at home specially requirement of tracheostomy care, awaiting placement. He developed worsening pulmonary edema. Being admitted for management of acute on chronic diastolic heart failure and given IV Lasix.  Patient condition has gradually improved.  He was able to tolerate nocturnal trilogy ventilation.  Currently pending nursing placement.  Acute on chronic hypoxic and hypercapnic respiratory failure secondary to advanced COPD Tracheostomy tube dependence Hx of frequent intubation Trach exchange performed on 11/14.   Acute on chronic diastolic (congestive) heart failure ([place]):  Appears euvolemic now. - Continue torsemide 20 mg daily.     Hypokalemia --cont daily potassium supplement.   AKI: Improved. -Monitor intermittently   Chronic obstructive pulmonary disease: --Pt has been refusing Combivent   GERD without esophagitis --pt has been refusing PPI   Morbid obesity Body mass index is 51.75 kg/m.   Anxiety and depression -pt has been refusing Lexapro    Dyslipidemia -continue statin therapy.  Microcytic normochromic anemia Iron deficiency anemia. Continue iron supplementation. s/p IV iron x3 doses  Right knee pain secondary to lateral and medial meniscal tear. MRI knee confirms. --ortho consulted, most likely require a total knee replacement to address both the meniscal pathology and the degenerative changes, however, pt  currently is not a surgical candidate. --s/p Kenalog injection on 11/10 with improved symptoms.    DVT prophylaxis: TO:IZTIWPY Code Status: Full code  Family Communication:  Level of care: Progressive Dispo:   The patient is from: home Anticipated d/c is to: home.  After a prolonged search for SNF, pt decided to go home. Anticipated d/c date is: Friday   Subjective and Interval History:  No change.  TOC setting up [facility] for pt to go back home.   Objective: Vitals:   04/03/22 1932 04/04/22 1300 04/04/22 2000 04/05/22 1200  BP: 109/70 111/78 126/79 122/85  Pulse: 80 88 79 80  Resp: '18 18 18 16  '$ Temp: 98.8 F (37.1 C) 98.7 F (37.1 C) 98.7 F (37.1 C) 98 F (36.7 C)  TempSrc: Oral Oral Oral Axillary  SpO2: 93% 95% 93%   Weight:      Height:        Intake/Output Summary (Last 24 hours) at 04/05/2022 1644 Last data filed at 04/05/2022 0998 Gross per 24 hour  Intake 240 ml  Output 1100 ml  Net -860 ml   Filed Weights   03/24/22 0500 03/25/22 0459 03/28/22 0500  Weight: (!) 191 kg (!) 196 kg (!) 187.8 kg    Examination:   Constitutional: NAD, AAOx3 HEENT: conjunctivae and lids normal, EOMI CV: No cyanosis.   RESP: normal respiratory effort, on RA SKIN: warm, dry Neuro: II - XII grossly intact.     Data Reviewed: I have personally reviewed labs and imaging studies  Time spent: 25 minutes  Enzo Bi, MD [facility] If 7PM-7AM, please contact night-coverage 04/05/2022, 4:44 PM

## 2022-04-05 NOTE — Progress Notes (Signed)
PT Cancellation Note  Patient Details Name: Gary [name] MRN: [id number] DOB: Mar 13, 1956   Cancelled Treatment:     PT attempt. Pt politely refused stating his L hand is hurting and requested to let it rest for a day. He does state he is planning to DC home tomorrow if [facility] services are arranged. As requested, will return tomorrow and continue to follow and advance per current POC.    [name] 04/05/2022, 2:12 PM

## 2022-04-05 NOTE — TOC Progression Note (Signed)
Transition of Care [facility]) - Progression Note    Patient Details  Name: Gary [name] MRN: [id number] Date of Birth: [birth year]  Transition of Care [facility]) CM/SW Contact  Shelbie Hutching, RN Phone Number: 04/05/2022, 4:35 PM  Clinical Narrative:    Julio Sicks with [employer] that Sarath going home tomorrow so she can let Keat with Respiratory know to follow up with patient at home.      Barriers to Discharge: Continued Medical Work up, Ship broker, Unsafe home situation  Expected Discharge Plan and Services In-house Referral: Clinical Social Work Discharge Planning Services: [place] arrangements for the past 2 months: [place]: PT, OT [place] Agency: [place] ([place]) Date [place]: 04/03/22 Time [place]: 1548 Representative spoke with at [place]: [place] Determinants of Health ([place]) Interventions [place]: No Food Insecurity (02/05/2022)  Housing: Low Risk  (02/05/2022)  Transportation Needs: Unmet Transportation Needs (02/05/2022)  Utilities: At Risk (02/05/2022)  Depression (PHQ2-9): Low Risk  (06/07/2021)  Financial Resource Strain: Medium Risk (05/03/2017)  Physical Activity: Insufficiently Active (05/03/2017)  Social Connections: Moderately Integrated (05/03/2017)  Stress: No Stress Concern Present (05/03/2017)  Tobacco Use: Medium Risk (02/27/2022)    Readmission Risk Interventions    12/31/2021    8:58 AM 12/20/2021    4:21 PM 05/04/2021    1:26 PM  Readmission Risk Prevention Plan  Transportation Screening Complete Complete Complete  Medication Review Press photographer) Complete Complete Complete  PCP or Specialist appointment within 3-5 days of discharge Complete Complete Complete  HRI or Home Care Consult Complete Complete Complete  SW Recovery Care/Counseling Consult Complete Complete  Complete  Palliative Care Screening Complete Complete Not [place] Not Applicable Not Applicable Not Applicable

## 2022-04-05 NOTE — Discharge Instructions (Signed)
[facility] to provide [facility] services for PT and OT.  Aide Services to be provided by [facility] [phone].  Patient approved for 18.5 hours of aide services per week someone to come out 7 days a week.    Gary [name] will call next week probably after 12/26 to schedule PT and OT.

## 2022-04-06 DIAGNOSIS — I5033 Acute on chronic diastolic (congestive) heart failure: Secondary | ICD-10-CM | POA: Diagnosis not present

## 2022-04-06 MED ORDER — IPRATROPIUM-ALBUTEROL 0.5-2.5 (3) MG/3ML IN SOLN: 3.0000 mL | Freq: Four times a day (QID) | RESPIRATORY_TRACT | Status: DC | PRN

## 2022-04-06 MED ORDER — TORSEMIDE 20 MG PO TABS: 20.0000 mg | ORAL_TABLET | Freq: Every day | ORAL | 2 refills | Status: DC

## 2022-04-06 MED ORDER — FERROUS SULFATE 325 (65 FE) MG PO TABS: 325.0000 mg | ORAL_TABLET | Freq: Every day | ORAL | 1 refills | Status: DC

## 2022-04-06 NOTE — Discharge Summary (Addendum)
Physician Discharge Summary   JACOBB ALEN  male DOB: [birth year]  KXF:[id number]  PCP: [facility] [place] date: 01/23/2022 Discharge date: 04/06/2022  Admitted From: home Disposition:  home.  Pt decided he no longer wants to wait for SNF, and went home with [facility]. Home Health: Yes CODE STATUS: Full code  Discharge Instructions     No wound care   Complete by: As directed       Hospital Course:  For full details, please see H&P, progress notes, consult notes and ancillary notes.  Briefly,  MICHALE EMMERICH is a 66 y.o. male with medical history significant for diastolic CHF, chronic respiratory failure status post tracheostomy, COPD, essential hypertension, PUD and sleep apnea who presented to the emergency room with acute onset of worsening dyspnea.   Apparently patient was in the ER for 10 days as he was unable to be cared at home specially requirement of tracheostomy care, awaiting placement. He developed worsening pulmonary edema and was admitted for management of acute on chronic diastolic heart failure and given IV Lasix.  Patient condition has gradually improved.  He was able to tolerate nocturnal trilogy ventilation.  Hospitalization was prolonged due to pt's need for SNF rehab and difficulty locating a SNF to accept him.  Pt's functional status improved with inpatient PT/OT, and he decided he no longer wanted to wait for SNF, and decided to go home with [facility].   Acute on chronic hypoxic and hypercapnic respiratory failure secondary to advanced COPD Tracheostomy tube dependence Hx of frequent intubation Trach exchange performed on 11/14.  - Currently stable.  At baseline requires nocturnal trilogy ventilation--pt using his own machine at home. --Pt wanted to remove trach, however, per pulm Dr. Lanney Gins, "If we take it out, there may be tracheomalacia from scarring and next time he comes to be intubated it will be complicated he may code and die. So for now  he needs to get better and get out of hospital then we can assess once in chronic stable state" as outpatient. --Pt refused scheduled Combivent during hospitalization.  Recommend that he continues taking it as directed.   Acute on chronic diastolic (congestive) heart failure ([place]):  Appears euvolemic now. - Continue torsemide 20 mg daily.     Hypokalemia --only mild and infrequent.  No need to continue daily potassium 10 mEq.   AKI: Improved. --Cr 1.27 prior to discharge.  Baseline is around 1.2.   Chronic obstructive pulmonary disease: --Pt refused scheduled Combivent during hospitalization.  Recommend that he continues taking it as directed.   GERD without esophagitis --pt has been refusing PPI   Morbid obesity Body mass index is 51.75 kg/m.   Anxiety and depression -pt has been refusing Lexapro    Dyslipidemia -continue statin therapy.   Microcytic normochromic anemia Iron deficiency anemia. s/p IV iron x3 doses Continue oral iron supplementation.   Right knee pain secondary to lateral and medial meniscal tear. MRI knee confirmed. --ortho consulted, most likely require a total knee replacement to address both the meniscal pathology and the degenerative changes, however, pt currently is not a surgical candidate. --s/p Kenalog injection by Dr. Roland Rack on 11/10 with improved symptoms.   Discharge Diagnoses:  Principal Problem:   Acute on chronic diastolic (congestive) heart failure ([name]) Active Problems:   Acute on chronic respiratory failure with hypoxia ([name])   AKI (acute kidney injury) ([name])   Chronic obstructive pulmonary disease (COPD) ([name])   Obesity, Class III, BMI 40-49.9 (morbid  obesity) ([place])   GERD without esophagitis   Anxiety and depression   Dyslipidemia   Hypokalemia   30 Day Unplanned Readmission Risk Score    Flowsheet Row ED to Hosp-Admission (Current) from 01/23/2022 in [place] ICU/CCU  30 Day Unplanned Readmission  Risk Score (%) 64.04 Filed at 04/06/2022 0401       This score is the patient's risk of an unplanned readmission within 30 days of being discharged (0 -100%). The score is based on dignosis, age, lab data, medications, orders, and past utilization.   Low:  0-14.9   Medium: 15-21.9   High: 22-29.9   Extreme: 30 and above         Discharge Instructions:  Allergies as of 04/06/2022   No Known Allergies      Medication List     STOP taking these medications    Advair HFA 230-21 MCG/ACT inhaler Generic drug: fluticasone-salmeterol   budesonide 0.25 MG/2ML nebulizer solution Commonly known as: PULMICORT   clonazePAM 0.5 MG disintegrating tablet Commonly known as: KLONOPIN   gabapentin 400 MG capsule Commonly known as: NEURONTIN   potassium chloride 10 MEQ tablet Commonly known as: KLOR-CON M   QUEtiapine 25 MG tablet Commonly known as: SEROQUEL       TAKE these medications    apixaban 2.5 MG Tabs tablet Commonly known as: ELIQUIS Take 1 tablet (2.5 mg total) by mouth 2 (two) times daily.   benzonatate 100 MG capsule Commonly known as: TESSALON Take 100 mg by mouth 3 (three) times daily as needed for cough.   bisacodyl 10 MG suppository Commonly known as: DULCOLAX Place 1 suppository (10 mg total) rectally daily as needed for moderate constipation.   Combivent Respimat 20-100 MCG/ACT Aers respimat Generic drug: Ipratropium-Albuterol Inhale 2 puffs into the lungs in the morning and at bedtime.   ipratropium-albuterol 0.5-2.5 (3) MG/3ML Soln Commonly known as: DUONEB Take 3 mLs by nebulization every 6 (six) hours as needed.   escitalopram 5 MG tablet Commonly known as: LEXAPRO Take 1 tablet (5 mg total) by mouth daily.   ferrous sulfate 325 (65 FE) MG tablet Take 1 tablet (325 mg total) by mouth daily with breakfast. Home med What changed:  when to take this additional instructions   hydrOXYzine 25 MG tablet Commonly known as: ATARAX Take 1 tablet  (25 mg total) by mouth 3 (three) times daily as needed for anxiety.   melatonin 5 MG Tabs Take 1 tablet (5 mg total) by mouth at bedtime.   pantoprazole 40 MG tablet Commonly known as: PROTONIX Take 1 tablet (40 mg total) by mouth 2 (two) times daily before a meal.   polyethylene glycol 17 g packet Commonly known as: MIRALAX / GLYCOLAX Take 17 g by mouth daily as needed for moderate constipation.   simvastatin 10 MG tablet Commonly known as: ZOCOR Take 1 tablet (10 mg total) by mouth daily at 6 PM.   torsemide 20 MG tablet Commonly known as: DEMADEX Take 1 tablet (20 mg total) by mouth daily.         Follow-up Information     [name], MD Follow up in 2 week(s).   Specialty: Pulmonary Disease Contact information: [place] [id number] [phone]                 No Known Allergies   The results of significant diagnostics from this hospitalization (including imaging, microbiology, ancillary and laboratory) are listed below for reference.  Consultations:   Procedures/Studies: No results found.    Labs: BNP (last 3 results) Recent Labs    12/30/21 2146 01/23/22 1028 02/03/22 0105  BNP 326.2* 23.1 17.4   Basic Metabolic Panel: Recent Labs  Lab 03/31/22 0903 04/01/22 0946 04/02/22 0704  NA 140 139 142  K 3.4* 3.5 4.2  CL 103 102 108  CO2 '28 29 30  '$ GLUCOSE 150* 141* 106*  BUN 30* 31* 31*  CREATININE 1.42* 1.34* 1.27*  CALCIUM 8.6* 8.4* 8.6*  MG  --  2.4 2.5*   Liver Function Tests: No results for input(s): "AST", "ALT", "ALKPHOS", "BILITOT", "PROT", "ALBUMIN" in the last 168 hours. No results for input(s): "LIPASE", "AMYLASE" in the last 168 hours. No results for input(s): "AMMONIA" in the last 168 hours. CBC: Recent Labs  Lab 03/31/22 0903 04/01/22 0946 04/02/22 0704  WBC 8.4 7.6 6.6  HGB 10.2* 10.3* 9.9*  HCT 37.4* 37.4* 36.2*  MCV 79.2* 79.6* 79.0*  PLT 233 204 213   Cardiac Enzymes: No  results for input(s): "CKTOTAL", "CKMB", "CKMBINDEX", "TROPONINI" in the last 168 hours. BNP: Invalid input(s): "POCBNP" CBG: No results for input(s): "GLUCAP" in the last 168 hours. D-Dimer No results for input(s): "DDIMER" in the last 72 hours. Hgb A1c No results for input(s): "HGBA1C" in the last 72 hours. Lipid Profile No results for input(s): "CHOL", "HDL", "LDLCALC", "TRIG", "CHOLHDL", "LDLDIRECT" in the last 72 hours. Thyroid function studies No results for input(s): "TSH", "T4TOTAL", "T3FREE", "THYROIDAB" in the last 72 hours.  Invalid input(s): "FREET3" Anemia work up No results for input(s): "VITAMINB12", "FOLATE", "FERRITIN", "TIBC", "IRON", "RETICCTPCT" in the last 72 hours. Urinalysis    Component Value Date/Time   COLORURINE YELLOW (A) 01/23/2022 2110   APPEARANCEUR CLEAR (A) 01/23/2022 2110   APPEARANCEUR Clear 04/15/2014 1240   LABSPEC 1.017 01/23/2022 2110   LABSPEC 1.013 04/15/2014 1240   PHURINE 5.0 01/23/2022 2110   GLUCOSEU NEGATIVE 01/23/2022 2110   GLUCOSEU Negative 04/15/2014 1240   HGBUR NEGATIVE 01/23/2022 2110   BILIRUBINUR NEGATIVE 01/23/2022 2110   BILIRUBINUR Negative 04/15/2014 [place] 01/23/2022 2110   PROTEINUR NEGATIVE 01/23/2022 2110   NITRITE NEGATIVE 01/23/2022 2110   LEUKOCYTESUR NEGATIVE 01/23/2022 2110   LEUKOCYTESUR Negative 04/15/2014 1240   Sepsis Labs Recent Labs  Lab 03/31/22 0903 04/01/22 0946 04/02/22 0704  WBC 8.4 7.6 6.6   Microbiology No results found for this or any previous visit (from the past 240 hour(s)).   Total time spend on discharging this patient, including the last patient exam, discussing the hospital stay, instructions for ongoing care as it relates to all pertinent caregivers, as well as preparing the medical discharge records, prescriptions, and/or referrals as applicable, is 35 minutes.    Enzo Bi, MD  [facility] 04/06/2022, 7:43 AM

## 2022-04-06 NOTE — TOC Progression Note (Signed)
Transition of Care [facility]) - Progression Note    Patient Details  Name: Gary [name] MRN: [id number] Date of Birth: 06/29/1955  Transition of Care [facility]) CM/SW Contact  Laurena Slimmer, RN Phone Number: 04/06/2022, 9:26 AM  Clinical Narrative:    Retrieved call from [facility] regarding patient's destination for discharge. Advised [facility] representative, Nadene Rubins per assigned RNCM from yesterday patient will discharge home [place] [phone]    Expected Discharge Plan: [place] Barriers to Discharge: Continued Medical Work up, Ship broker, Unsafe home situation  Expected Discharge Plan and Services In-house Referral: Clinical Social Work Discharge Planning Services: [place] Acute Care Choice: [place] arrangements for the past 2 months: [place] Expected Discharge Date: 04/06/22                         HH Arranged: PT, OT [place] Agency: [place] ([place]) Date [place]: 04/03/22 Time [place]: 1548 Representative spoke with at [place]: [place] Determinants of Health ([place]) Interventions [place]: No Food Insecurity (02/05/2022)  Housing: Low Risk  (02/05/2022)  Transportation Needs: Unmet Transportation Needs (02/05/2022)  Utilities: At Risk (02/05/2022)  Depression (PHQ2-9): Low Risk  (06/07/2021)  Financial Resource Strain: Medium Risk (05/03/2017)  Physical Activity: Insufficiently Active (05/03/2017)  Social Connections: Moderately Integrated (05/03/2017)  Stress: No Stress Concern Present (05/03/2017)  Tobacco Use: Medium Risk (02/27/2022)    Readmission Risk Interventions    12/31/2021    8:58 AM 12/20/2021    4:21 PM 05/04/2021    1:26 PM  Readmission Risk Prevention Plan  Transportation Screening Complete Complete Complete  Medication Review Press photographer) Complete Complete Complete  PCP or Specialist appointment  within 3-5 days of discharge Complete Complete Complete  HRI or Home Care Consult Complete Complete Complete  SW Recovery Care/Counseling Consult Complete Complete Complete  Palliative Care Screening Complete Complete Not [place] Not Applicable Not Applicable Not Applicable

## 2022-04-06 NOTE — Progress Notes (Signed)
RN attempted to assess pt's vital signs and general systems assessment.  Pt refused all assessments, stating "there's no sense in doing any of that.  I'm fixing to get ready to leave."

## 2022-04-06 NOTE — Progress Notes (Signed)
Verbal report given to Janett Labella and [place] with EMS.  Patient taken to EMS bay in front ER via stretcher to be taken home by [facility].

## 2022-04-11 ENCOUNTER — Other Ambulatory Visit: Payer: Self-pay

## 2022-04-11 ENCOUNTER — Inpatient Hospital Stay
Admission: EM | Admit: 2022-04-11 | Discharge: 2022-04-16 | DRG: 291 | Disposition: A | Discharge status: Home / Self Care | Payer: Medicaid Other | Attending: Internal Medicine | Admitting: Internal Medicine

## 2022-04-11 ENCOUNTER — Emergency Department: Payer: Medicaid Other

## 2022-04-11 DIAGNOSIS — Z6841 Body Mass Index (BMI) 40.0 and over, adult: Secondary | ICD-10-CM | POA: Diagnosis not present

## 2022-04-11 DIAGNOSIS — I10 Essential (primary) hypertension: Secondary | ICD-10-CM | POA: Diagnosis present

## 2022-04-11 DIAGNOSIS — Z823 Family history of stroke: Secondary | ICD-10-CM | POA: Diagnosis not present

## 2022-04-11 DIAGNOSIS — Z8616 Personal history of COVID-19: Secondary | ICD-10-CM | POA: Diagnosis not present

## 2022-04-11 DIAGNOSIS — I5033 Acute on chronic diastolic (congestive) heart failure: Secondary | ICD-10-CM | POA: Diagnosis present

## 2022-04-11 DIAGNOSIS — R0602 Shortness of breath: Principal | ICD-10-CM

## 2022-04-11 DIAGNOSIS — J449 Chronic obstructive pulmonary disease, unspecified: Secondary | ICD-10-CM | POA: Diagnosis present

## 2022-04-11 DIAGNOSIS — Z91148 Patient's other noncompliance with medication regimen for other reason: Secondary | ICD-10-CM

## 2022-04-11 DIAGNOSIS — J101 Influenza due to other identified influenza virus with other respiratory manifestations: Secondary | ICD-10-CM | POA: Insufficient documentation

## 2022-04-11 DIAGNOSIS — Z91199 Patient's noncompliance with other medical treatment and regimen due to unspecified reason: Secondary | ICD-10-CM

## 2022-04-11 DIAGNOSIS — F331 Major depressive disorder, recurrent, moderate: Secondary | ICD-10-CM | POA: Diagnosis present

## 2022-04-11 DIAGNOSIS — I130 Hypertensive heart and chronic kidney disease with heart failure and stage 1 through stage 4 chronic kidney disease, or unspecified chronic kidney disease: Secondary | ICD-10-CM | POA: Diagnosis present

## 2022-04-11 DIAGNOSIS — D696 Thrombocytopenia, unspecified: Secondary | ICD-10-CM | POA: Diagnosis present

## 2022-04-11 DIAGNOSIS — R0609 Other forms of dyspnea: Secondary | ICD-10-CM | POA: Diagnosis not present

## 2022-04-11 DIAGNOSIS — N182 Chronic kidney disease, stage 2 (mild): Secondary | ICD-10-CM | POA: Diagnosis present

## 2022-04-11 DIAGNOSIS — Z79899 Other long term (current) drug therapy: Secondary | ICD-10-CM | POA: Diagnosis not present

## 2022-04-11 DIAGNOSIS — E785 Hyperlipidemia, unspecified: Secondary | ICD-10-CM | POA: Diagnosis present

## 2022-04-11 DIAGNOSIS — N179 Acute kidney failure, unspecified: Secondary | ICD-10-CM | POA: Diagnosis present

## 2022-04-11 DIAGNOSIS — I509 Heart failure, unspecified: Secondary | ICD-10-CM

## 2022-04-11 DIAGNOSIS — K219 Gastro-esophageal reflux disease without esophagitis: Secondary | ICD-10-CM | POA: Diagnosis present

## 2022-04-11 DIAGNOSIS — Z7901 Long term (current) use of anticoagulants: Secondary | ICD-10-CM

## 2022-04-11 DIAGNOSIS — Z91119 Patient's noncompliance with dietary regimen due to unspecified reason: Secondary | ICD-10-CM

## 2022-04-11 DIAGNOSIS — J9611 Chronic respiratory failure with hypoxia: Secondary | ICD-10-CM | POA: Diagnosis present

## 2022-04-11 DIAGNOSIS — G4733 Obstructive sleep apnea (adult) (pediatric): Secondary | ICD-10-CM | POA: Diagnosis present

## 2022-04-11 DIAGNOSIS — I959 Hypotension, unspecified: Secondary | ICD-10-CM | POA: Diagnosis present

## 2022-04-11 DIAGNOSIS — Z930 Tracheostomy status: Secondary | ICD-10-CM

## 2022-04-11 DIAGNOSIS — R6510 Systemic inflammatory response syndrome (SIRS) of non-infectious origin without acute organ dysfunction: Secondary | ICD-10-CM

## 2022-04-11 DIAGNOSIS — R768 Other specified abnormal immunological findings in serum: Secondary | ICD-10-CM | POA: Diagnosis present

## 2022-04-11 DIAGNOSIS — Z833 Family history of diabetes mellitus: Secondary | ICD-10-CM

## 2022-04-11 DIAGNOSIS — Z9049 Acquired absence of other specified parts of digestive tract: Secondary | ICD-10-CM | POA: Diagnosis not present

## 2022-04-11 DIAGNOSIS — D631 Anemia in chronic kidney disease: Secondary | ICD-10-CM | POA: Diagnosis present

## 2022-04-11 DIAGNOSIS — L8990 Pressure ulcer of unspecified site, unspecified stage: Secondary | ICD-10-CM | POA: Diagnosis present

## 2022-04-11 DIAGNOSIS — Z87891 Personal history of nicotine dependence: Secondary | ICD-10-CM

## 2022-04-11 LAB — COMPREHENSIVE METABOLIC PANEL
ALT: 10 U/L (ref 0–44)
AST: 21 U/L (ref 15–41)
Albumin: 3.1 g/dL — ABNORMAL LOW (ref 3.5–5.0)
Alkaline Phosphatase: 94 U/L (ref 38–126)
Anion gap: 6 (ref 5–15)
BUN: 21 mg/dL (ref 8–23)
CO2: 26 mmol/L (ref 22–32)
Calcium: 8.6 mg/dL — ABNORMAL LOW (ref 8.9–10.3)
Chloride: 110 mmol/L (ref 98–111)
Creatinine, Ser: 1.05 mg/dL (ref 0.61–1.24)
GFR, Estimated: >60 mL/min (ref >60)
Glucose, Bld: 87 mg/dL (ref 70–99)
Potassium: 4.4 mmol/L (ref 3.5–5.1)
Sodium: 142 mmol/L (ref 135–145)
Total Bilirubin: 0.8 mg/dL (ref 0.3–1.2)
Total Protein: 7.9 g/dL (ref 6.5–8.1)

## 2022-04-11 LAB — CBC WITH DIFFERENTIAL/PLATELET
Abs Immature Granulocytes: 0.04 10*3/uL (ref 0.00–0.07)
Basophils Absolute: 0 10*3/uL (ref 0.0–0.1)
Basophils Relative: 0 %
Eosinophils Absolute: 0.1 10*3/uL (ref 0.0–0.5)
Eosinophils Relative: 1 %
HCT: 38.7 % — ABNORMAL LOW (ref 39.0–52.0)
Hemoglobin: 10.5 g/dL — ABNORMAL LOW (ref 13.0–17.0)
Immature Granulocytes: 1 %
Lymphocytes Relative: 9 %
Lymphs Abs: 0.6 10*3/uL — ABNORMAL LOW (ref 0.7–4.0)
MCH: 21.7 pg — ABNORMAL LOW (ref 26.0–34.0)
MCHC: 27.1 g/dL — ABNORMAL LOW (ref 30.0–36.0)
MCV: 80.1 fL (ref 80.0–100.0)
Monocytes Absolute: 0.7 10*3/uL (ref 0.1–1.0)
Monocytes Relative: 10 %
Neutro Abs: 5.4 10*3/uL (ref 1.7–7.7)
Neutrophils Relative %: 79 %
Platelets: 198 10*3/uL (ref 150–400)
RBC: 4.83 MIL/uL (ref 4.22–5.81)
RDW: 18.7 % — ABNORMAL HIGH (ref 11.5–15.5)
WBC: 6.8 10*3/uL (ref 4.0–10.5)
nRBC: 0 % (ref 0.0–0.2)

## 2022-04-11 LAB — URINALYSIS, ROUTINE W REFLEX MICROSCOPIC
Bilirubin Urine: NEGATIVE
Glucose, UA: NEGATIVE mg/dL
Hgb urine dipstick: NEGATIVE
Ketones, ur: NEGATIVE mg/dL
Leukocytes,Ua: NEGATIVE
Nitrite: NEGATIVE
Protein, ur: NEGATIVE mg/dL
Specific Gravity, Urine: 1.019 (ref 1.005–1.030)
pH: 5 (ref 5.0–8.0)

## 2022-04-11 LAB — TROPONIN I (HIGH SENSITIVITY)
Troponin I (High Sensitivity): 6 ng/L (ref <18)
Troponin I (High Sensitivity): 11 ng/L (ref <18)

## 2022-04-11 LAB — LACTIC ACID, PLASMA: Lactic Acid, Venous: 0.6 mmol/L (ref 0.5–1.9)

## 2022-04-11 LAB — RESP PANEL BY RT-PCR (RSV, FLU A&B, COVID)  RVPGX2
Influenza A by PCR: POSITIVE — AB
Influenza B by PCR: NEGATIVE
Resp Syncytial Virus by PCR: NEGATIVE
SARS Coronavirus 2 by RT PCR: NEGATIVE

## 2022-04-11 LAB — PROCALCITONIN: Procalcitonin: <0.1 ng/mL

## 2022-04-11 LAB — BRAIN NATRIURETIC PEPTIDE: B Natriuretic Peptide: 60.4 pg/mL (ref 0.0–100.0)

## 2022-04-11 MED ORDER — MELATONIN 5 MG PO TABS
5.0000 mg | ORAL_TABLET | Freq: Every day | ORAL | Status: DC
  Administered 2022-04-11 – 2022-04-15 (×5): 5 mg via ORAL
  Filled 2022-04-11 (×5): qty 1

## 2022-04-11 MED ORDER — IPRATROPIUM-ALBUTEROL 0.5-2.5 (3) MG/3ML IN SOLN: 3.0000 mL | Freq: Four times a day (QID) | RESPIRATORY_TRACT | Status: DC

## 2022-04-11 MED ORDER — IPRATROPIUM-ALBUTEROL 0.5-2.5 (3) MG/3ML IN SOLN
3.0000 mL | RESPIRATORY_TRACT | Status: DC | PRN
  Administered 2022-04-11: 3 mL via RESPIRATORY_TRACT
  Filled 2022-04-11: qty 3

## 2022-04-11 MED ORDER — IPRATROPIUM-ALBUTEROL 0.5-2.5 (3) MG/3ML IN SOLN
3.0000 mL | Freq: Two times a day (BID) | RESPIRATORY_TRACT | Status: DC
  Administered 2022-04-11: 3 mL via RESPIRATORY_TRACT
  Filled 2022-04-11 (×2): qty 3

## 2022-04-11 MED ORDER — HYDRALAZINE HCL 20 MG/ML IJ SOLN: 5.0000 mg | Freq: Three times a day (TID) | INTRAMUSCULAR | Status: AC | PRN

## 2022-04-11 MED ORDER — DIPHENHYDRAMINE HCL 50 MG/ML IJ SOLN
25.0000 mg | Freq: Every day | INTRAMUSCULAR | Status: AC
  Administered 2022-04-11: 25 mg via INTRAVENOUS
  Filled 2022-04-11: qty 1

## 2022-04-11 MED ORDER — ACETAMINOPHEN 650 MG RE SUPP: 650.0000 mg | Freq: Four times a day (QID) | RECTAL | Status: DC | PRN

## 2022-04-11 MED ORDER — FERROUS SULFATE 325 (65 FE) MG PO TABS
325.0000 mg | ORAL_TABLET | Freq: Every day | ORAL | Status: DC
  Administered 2022-04-12 – 2022-04-16 (×5): 325 mg via ORAL
  Filled 2022-04-11 (×5): qty 1

## 2022-04-11 MED ORDER — ONDANSETRON HCL 4 MG PO TABS: 4.0000 mg | ORAL_TABLET | Freq: Four times a day (QID) | ORAL | Status: DC | PRN

## 2022-04-11 MED ORDER — IPRATROPIUM-ALBUTEROL 0.5-2.5 (3) MG/3ML IN SOLN
3.0000 mL | Freq: Two times a day (BID) | RESPIRATORY_TRACT | Status: DC
  Administered 2022-04-12 – 2022-04-14 (×5): 3 mL via RESPIRATORY_TRACT
  Filled 2022-04-11 (×5): qty 3

## 2022-04-11 MED ORDER — ESCITALOPRAM OXALATE 10 MG PO TABS
5.0000 mg | ORAL_TABLET | Freq: Every day | ORAL | Status: DC
  Administered 2022-04-12 – 2022-04-16 (×5): 5 mg via ORAL
  Filled 2022-04-11 (×5): qty 1

## 2022-04-11 MED ORDER — APIXABAN 2.5 MG PO TABS
2.5000 mg | ORAL_TABLET | Freq: Two times a day (BID) | ORAL | Status: DC
  Administered 2022-04-11 – 2022-04-16 (×10): 2.5 mg via ORAL
  Filled 2022-04-11 (×13): qty 1

## 2022-04-11 MED ORDER — ONDANSETRON HCL 4 MG/2ML IJ SOLN: 4.0000 mg | Freq: Four times a day (QID) | INTRAMUSCULAR | Status: DC | PRN

## 2022-04-11 MED ORDER — FUROSEMIDE 10 MG/ML IJ SOLN
40.0000 mg | Freq: Two times a day (BID) | INTRAMUSCULAR | Status: AC
  Administered 2022-04-11 – 2022-04-12 (×2): 40 mg via INTRAVENOUS
  Filled 2022-04-11 (×2): qty 4

## 2022-04-11 MED ORDER — IPRATROPIUM-ALBUTEROL 20-100 MCG/ACT IN AERS: 1.0000 | INHALATION_SPRAY | Freq: Four times a day (QID) | RESPIRATORY_TRACT | Status: DC

## 2022-04-11 MED ORDER — ACETAMINOPHEN 325 MG PO TABS
650.0000 mg | ORAL_TABLET | Freq: Four times a day (QID) | ORAL | Status: DC | PRN
  Administered 2022-04-11 – 2022-04-14 (×5): 650 mg via ORAL
  Filled 2022-04-11 (×5): qty 2

## 2022-04-11 MED ORDER — POLYETHYLENE GLYCOL 3350 17 G PO PACK: 17.0000 g | PACK | Freq: Every day | ORAL | Status: DC | PRN

## 2022-04-11 MED ORDER — HALOPERIDOL LACTATE 5 MG/ML IJ SOLN: 5.0000 mg | Freq: Four times a day (QID) | INTRAMUSCULAR | Status: DC | PRN

## 2022-04-11 MED ORDER — LORAZEPAM 2 MG/ML IJ SOLN: 2.0000 mg | Freq: Four times a day (QID) | INTRAMUSCULAR | Status: DC | PRN

## 2022-04-11 NOTE — Progress Notes (Signed)
Patient's home trilogy is at bedside however patient does not have circuit that goes to the machine. Explained to patient that I needed to contact covering MD to get ventilator order at night, patient proceeds to let me know he will not wear hospital ventilator. He states that someone can bring his circuit tomorrow or Friday. Patient also asked if I could use hospital circuit on his unit. I explained to patient that we could not do this. Patient continues to refuse hospital unit. Patient remains on trach collar.

## 2022-04-11 NOTE — Assessment & Plan Note (Signed)
-   This complicates overall care and prognosis.  

## 2022-04-11 NOTE — Assessment & Plan Note (Signed)
-   Resumed home escitalopram 5 mg daily - Lorazepam 2 mg IV every 6 hours as needed for seizure, anxiety, 3 doses ordered

## 2022-04-11 NOTE — ED Triage Notes (Signed)
Pt presents via EMS with SOB x 3days. Pt has a trach x 9 months. Pt is bed bound with PT assistance. Pt with CHTN. Pt has multiple medical problems

## 2022-04-11 NOTE — ED Notes (Addendum)
Pt is currently resting at this time and is being very difficult in his care and is not allowing this RN to place PIV and requests "an [place] one". This RN also educated pt there will be multiple sticks to obtain needed blood work and PIV. Pt is no distress and is speaking and yelling full sentences with no issues at this time.

## 2022-04-11 NOTE — ED Notes (Signed)
Pt updated on lab coming to obtain lab draws and IV team coming to place [place] PIV guided IV.

## 2022-04-11 NOTE — Assessment & Plan Note (Signed)
-   Hydralazine 5 mg IV every 8 hours as needed for SBP greater than 180, 4 days ordered 

## 2022-04-11 NOTE — Assessment & Plan Note (Signed)
-   Wound care consulted 

## 2022-04-11 NOTE — ED Notes (Signed)
Pt refusing second set of BC's.
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
## 2022-04-11 NOTE — ED Notes (Signed)
Lab called to assist with blood draw due to pt refusing at this time, MD aware.

## 2022-04-11 NOTE — ED Notes (Signed)
RT at bedside to suction pt.
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
## 2022-04-11 NOTE — ED Notes (Signed)
Pt placed on bed pan to have a BM, pt states "I have been holding it since Friday" when pt asked why he shrugged his shoulders. Pt taken off bed pan and cleaned up, new chux applied, pt also placed on hospital bed to assist with rolling. Pt is very demanding and refuses to hep with his care. Pt is morbidly obese and has unrealistic expectations of what he wants and when he wants it and does not understand the safety for staff.  Pt keeps stating he is SOB but refuses to stop talking and breathe as educated, pt keeps demanding multiple requests and taking off his trach collar to yell at staff.   Pt also states he can assist but then refuses to help roll to remove the placed bed pan.

## 2022-04-11 NOTE — Assessment & Plan Note (Signed)
-   As patient has endorsed worsening shortness of breath since he has been out at home, patient is outside the window for benefits of Tamiflu

## 2022-04-11 NOTE — Assessment & Plan Note (Signed)
-   Resumed home scheduled Combivent/DuoNeb twice daily - DuoNebs as needed for wheezing or shortness of breath ordered

## 2022-04-11 NOTE — ED Notes (Signed)
2nd RN assessed for IV. Pt refused all proposed placements. Dr Beather Arbour aware

## 2022-04-11 NOTE — Hospital Course (Signed)
[name] is a 66 year old male with history of morbid obesity, GERD, hyperlipidemia, chronic respiratory failure status post tracheostomy, COPD, hypertension, who presents emergency department for chief concerns of shortness of breath.  Vitals in the ED showed temperature of 100.2, respiration rate of 20, heart rate of 102, blood pressure 120/72, SpO2 93% on 5 L trach.  Serum sodium is 142, potassium 4.4, chloride 110, bicarb of 26, BUN of 21, serum creatinine of 1.05, EGFR greater than 60, nonfasting blood glucose 87, WBC 6.8, hemoglobin 10.5, platelets of 198.  BNP was 60.4.  Lactic acid is 0.6.  Patient tested positive for influenza A by PCR.  UA was negative for leukocytes and nitrates.  ED treatment: None.

## 2022-04-11 NOTE — Assessment & Plan Note (Signed)
-   Continue tracheostomy - Continue DuoNebs every 6 hours as needed for wheezing and shortness of breath

## 2022-04-11 NOTE — ED Notes (Signed)
Pt still refusing blood draws at this time.

## 2022-04-11 NOTE — ED Notes (Signed)
RT called to suction trach

## 2022-04-11 NOTE — ED Provider Notes (Signed)
Patient admitted to the care and treatment of Dr. Tobie Poet.  Patient being admitted for influenza, meets SIRS criteria and multiple risk factors for elevated risk of morbidity and mortality.   Delman Kitten, MD 04/11/22 [phone]

## 2022-04-11 NOTE — Assessment & Plan Note (Signed)
-   Presumed secondary to noncompliance with heart healthy diet at home - Strict I's and O's - Furosemide 40 mg IV twice daily, 2 doses ordered - Complete echo ordered - Admit to telemetry medical, inpatient

## 2022-04-11 NOTE — ED Notes (Signed)
Pt is not cooperative and yelling out demands. Not allowing nurse to look for IV site. States he has only ever had one IV in all his time in the hospital.

## 2022-04-11 NOTE — ED Notes (Signed)
Respiratory paged

## 2022-04-11 NOTE — ED Notes (Signed)
Pt awake at this time, pt's dinner heated up per request, pt given remote as well per request. Call bell in reach.  RT called for suction per pt request.

## 2022-04-12 ENCOUNTER — Inpatient Hospital Stay (HOSPITAL_COMMUNITY)
Admit: 2022-04-12 | Discharge: 2022-04-12 | Disposition: A | Discharge status: Home / Self Care | Payer: Medicaid Other | Attending: Internal Medicine | Admitting: Internal Medicine

## 2022-04-12 DIAGNOSIS — R0609 Other forms of dyspnea: Secondary | ICD-10-CM

## 2022-04-12 DIAGNOSIS — Z6841 Body Mass Index (BMI) 40.0 and over, adult: Secondary | ICD-10-CM

## 2022-04-12 DIAGNOSIS — J101 Influenza due to other identified influenza virus with other respiratory manifestations: Secondary | ICD-10-CM | POA: Diagnosis not present

## 2022-04-12 DIAGNOSIS — J9611 Chronic respiratory failure with hypoxia: Secondary | ICD-10-CM

## 2022-04-12 DIAGNOSIS — K219 Gastro-esophageal reflux disease without esophagitis: Secondary | ICD-10-CM | POA: Diagnosis not present

## 2022-04-12 DIAGNOSIS — I10 Essential (primary) hypertension: Secondary | ICD-10-CM

## 2022-04-12 DIAGNOSIS — I5033 Acute on chronic diastolic (congestive) heart failure: Secondary | ICD-10-CM

## 2022-04-12 LAB — PROCALCITONIN: Procalcitonin: 0.12 ng/mL

## 2022-04-12 LAB — ECHOCARDIOGRAM COMPLETE
AR max vel: 2.21 cm2
AV Area VTI: 2.01 cm2
AV Area mean vel: 2.24 cm2
AV Mean grad: 5.5 mmHg
AV Peak grad: 9.5 mmHg
Ao pk vel: 1.55 m/s
Area-P 1/2: 9.14 cm2
Height: 75 in
S' Lateral: 3.3 cm
Weight: 6400 oz

## 2022-04-12 LAB — BASIC METABOLIC PANEL
Anion gap: 6 (ref 5–15)
BUN: 24 mg/dL — ABNORMAL HIGH (ref 8–23)
CO2: 24 mmol/L (ref 22–32)
Calcium: 8.3 mg/dL — ABNORMAL LOW (ref 8.9–10.3)
Chloride: 110 mmol/L (ref 98–111)
Creatinine, Ser: 1.37 mg/dL — ABNORMAL HIGH (ref 0.61–1.24)
GFR, Estimated: 58 mL/min — ABNORMAL LOW (ref >60)
Glucose, Bld: 91 mg/dL (ref 70–99)
Potassium: 3.7 mmol/L (ref 3.5–5.1)
Sodium: 140 mmol/L (ref 135–145)

## 2022-04-12 LAB — CBC
HCT: 38.6 % — ABNORMAL LOW (ref 39.0–52.0)
Hemoglobin: 10.3 g/dL — ABNORMAL LOW (ref 13.0–17.0)
MCH: 21.7 pg — ABNORMAL LOW (ref 26.0–34.0)
MCHC: 26.7 g/dL — ABNORMAL LOW (ref 30.0–36.0)
MCV: 81.4 fL (ref 80.0–100.0)
Platelets: 135 10*3/uL — ABNORMAL LOW (ref 150–400)
RBC: 4.74 MIL/uL (ref 4.22–5.81)
RDW: 19.7 % — ABNORMAL HIGH (ref 11.5–15.5)
WBC: 5.9 10*3/uL (ref 4.0–10.5)
nRBC: 0 % (ref 0.0–0.2)

## 2022-04-12 LAB — URINE CULTURE: Culture: <10000 — AB

## 2022-04-12 MED ORDER — FLUTICASONE PROPIONATE 50 MCG/ACT NA SUSP
2.0000 | Freq: Every day | NASAL | Status: DC
  Filled 2022-04-12: qty 16

## 2022-04-12 MED ORDER — LORATADINE 10 MG PO TABS: 10.0000 mg | ORAL_TABLET | Freq: Every day | ORAL | Status: DC

## 2022-04-12 MED ORDER — FUROSEMIDE 10 MG/ML IJ SOLN
40.0000 mg | Freq: Every day | INTRAMUSCULAR | Status: DC
  Administered 2022-04-13: 40 mg via INTRAVENOUS
  Filled 2022-04-12: qty 4

## 2022-04-12 NOTE — ED Notes (Signed)
Pt's trach was suction, used 43m saline to help break up secretions, moderate amount of yellow sputum return from catheter , pt tolerated well. RT notified, RT to assess pt

## 2022-04-12 NOTE — ED Notes (Signed)
Pt cleaned of runny stool and urine by this Rn, Merleen Nicely RN, and Elana RN- clean purwick placed- pt linens changed and chux pads placed under- pt repositioned in bed and given warm blankets- pt denies needs at this time

## 2022-04-12 NOTE — Consult Note (Signed)
[facility] Nurse Consult Note: Reason for Consult: Consulted for "wounds" .  Patient looks at me flatly and replies he has sores from lying in wetness to the backs of his legs.  Was incontinent of stool and cleansed by staff and his buttocks had no breakdown.  Partial thickness tissue loss to upper thighs on the posterior aspect. Incontinence associated skin damage Wound type: Moisture associated skin damage from incontinence Pressure Injury POA: NA Measurement: scattered nonintact pink lesions to posterior upper thighs Wound XIP:JASN Drainage (amount, consistency, odor) scant weeping Periwound: intact  exposed to moisture frequently due to incontinence and perspiration.  Body habitus makes self care and mobility difficult.  Dressing procedure/placement/frequency: Cleanse thighs with soap and water.  Barrier ointment twice daily and PRN soilage.  No disposable briefs or underpads.  Will not follow at this time.  Please re-consult if needed.  Estrellita Ludwig MSN, RN, FNP-BC [facility] Wound, Ostomy, Continence Nurse [facility] [phone] Pager [phone]

## 2022-04-12 NOTE — ED Notes (Signed)
Pt continues to rest, can observe even RR, still on humidified supplemental oxygen via trach and on continuous cardiac monitoring devices, no changes noted, side rails up x2 for safety, NAD noted, plan of care ongoing, call light within reach, no further concerns as of present.

## 2022-04-12 NOTE — Progress Notes (Signed)
[facility]                                                                               Gary [name], is a 66 y.o. male, DOB - 14-Mar-1956, YBW:[id number] Admit date - 04/11/2022    Outpatient Primary MD for the patient is [facility]  LOS - 1  days    Brief summary    [name] is a 66 year old male with history of morbid obesity, GERD, hyperlipidemia, chronic respiratory failure status post tracheostomy, COPD, hypertension, who presents emergency department for chief concerns of shortness of breath.     Assessment & Plan    Assessment and Plan: * Acute exacerbation of CHF (congestive heart failure) ([name]) Probably secondary to noncompliance to medications and diet Patient was started on Lasix 40 mg twice daily, transition to 40 mg daily, echocardiogram ordered. Strict intake and output and daily weights. Diuresed about 1.5 L since admission. Monitor creatinine while on Lasix.   Major depressive disorder, recurrent episode, moderate ([name]) Continue with home escitalopram  Chronic obstructive pulmonary disease (COPD) ([place]) Supportive management with bronchodilators and Combivent.   HTN (hypertension) Blood pressure parameters are well-controlled  Morbid obesity with BMI of 50.0-59.9, adult ([name]) Poor prognostic factor  Influenza A Patient tested positive for influenza a but he is outside the window for benefits of Tamiflu Chest x-ray does not show any pneumonia shows mild edema suspicious for CHF  Moisture associated skin damage - Wound care consulted  Chronic respiratory failure with hypoxia ([place]) - Continue tracheostomy - Continue DuoNebs every 6 hours as needed for wheezing and shortness of breath  Therapy evaluations ordered  Estimated body mass index is 50 kg/m as calculated from the following:   Height as of this encounter: '6\' 3"'$  (1.905 m).   Weight as of this encounter: 181.4 kg.  Code Status: Full code DVT  Prophylaxis:  apixaban (ELIQUIS) tablet 2.5 mg Start: 04/11/22 1530 Place TED hose Start: 04/11/22 1357 apixaban (ELIQUIS) tablet 2.5 mg   Level of Care: Level of care: Telemetry Medical Family Communication: None at bedside  Disposition Plan:     Remains inpatient appropriate: Possibly home in the next 24 hours after being diuresed  Procedures:  None  Consultants:   None  Antimicrobials:   Anti-infectives (From admission, onward)    None        Medications  Scheduled Meds:  apixaban  2.5 mg Oral BID   escitalopram  5 mg Oral Daily   ferrous sulfate  325 mg Oral Q breakfast   [START ON 04/13/2022] furosemide  40 mg Intravenous Daily   ipratropium-albuterol  3 mL Nebulization BID   melatonin  5 mg Oral QHS   Continuous Infusions: PRN Meds:.acetaminophen **OR** acetaminophen, haloperidol lactate, hydrALAZINE, ipratropium-albuterol, LORazepam, ondansetron **OR** ondansetron (ZOFRAN) IV, polyethylene glycol    Subjective:   Gary [name] was seen and examined today.  Breathing is the same wants to know when he can be discharged home  Objective:   Vitals:   04/12/22 1000 04/12/22 1100 04/12/22 1130 04/12/22 1200  BP: 133/80 108/77  120/76  Pulse: 95 93 90 91  Resp: (!) 23  Temp:      TempSrc:      SpO2: 96% 91% 94% 94%  Weight:      Height:        Intake/Output Summary (Last 24 hours) at 04/12/2022 1346 Last data filed at 04/12/2022 1156 Gross per 24 hour  Intake --  Output 1500 ml  Net -1500 ml   Filed Weights   04/11/22 0604  Weight: (!) 181.4 kg     Exam. General exam: Appears calm and comfortable  Respiratory system:  diminished air entry at bases. S/p trach .  Cardiovascular system: S1 & S2 heard, RRR.  Gastrointestinal system: Abdomen is nondistended, soft and nontender. Central nervous system: Alert and oriented. No focal neurological deficits. Extremities: no edema.  Skin: No rashes Psychiatry:  Mood & affect appropriate.    Data  Reviewed:  I have personally reviewed following labs and imaging studies   CBC Lab Results  Component Value Date   WBC 5.9 04/12/2022   RBC 4.74 04/12/2022   HGB 10.3 (L) 04/12/2022   HCT 38.6 (L) 04/12/2022   MCV 81.4 04/12/2022   MCH 21.7 (L) 04/12/2022   PLT 135 (L) 04/12/2022   MCHC 26.7 (L) 04/12/2022   RDW 19.7 (H) 04/12/2022   LYMPHSABS 0.6 (L) 04/11/2022   MONOABS 0.7 04/11/2022   EOSABS 0.1 04/11/2022   BASOSABS 0.0 25/63/8937     Last metabolic panel Lab Results  Component Value Date   NA 140 04/12/2022   K 3.7 04/12/2022   CL 110 04/12/2022   CO2 24 04/12/2022   BUN 24 (H) 04/12/2022   CREATININE 1.37 (H) 04/12/2022   GLUCOSE 91 04/12/2022   GFRNONAA 58 (L) 04/12/2022   GFRAA 91 03/15/2020   CALCIUM 8.3 (L) 04/12/2022   PHOS 3.1 02/12/2022   PROT 7.9 04/11/2022   ALBUMIN 3.1 (L) 04/11/2022   BILITOT 0.8 04/11/2022   ALKPHOS 94 04/11/2022   AST 21 04/11/2022   ALT 10 04/11/2022   ANIONGAP 6 04/12/2022    CBG (last 3)  No results for input(s): "GLUCAP" in the last 72 hours.    Coagulation Profile: No results for input(s): "INR", "PROTIME" in the last 168 hours.   Radiology Studies: ECHOCARDIOGRAM COMPLETE  Result Date: 04/12/2022    ECHOCARDIOGRAM REPORT   Patient Name:   Gary [name] Date of Exam: 04/12/2022 Medical Rec #:  [id number]      Height:       75.0 in Accession #:    [id number]     Weight:       400.0 lb Date of Birth:  May 12, 1955      BSA:          2.946 m Patient Age:    31 years       BP:           Not listed in chart/Not listed in                                              chart mmHg Patient Gender: M              HR:           Not listed in chart bpm. Exam Location:  [facility] Procedure: 2D Echo, Cardiac Doppler and Color Doppler Indications:     Dyspnea R06.00  History:  Patient has prior history of Echocardiogram examinations, most                  recent 07/18/2021. COPD; Risk Factors:Hypertension. Tobacco use.  Sonographer:      Sherrie Sport Referring Phys:  [id number] Gary [name] Diagnosing Phys: Gary Sacramento MD  Sonographer Comments: Suboptimal apical window. IMPRESSIONS  1. Left ventricular ejection fraction, by estimation, is 55 to 60%. The left ventricle has normal function. Left ventricular endocardial border not optimally defined to evaluate regional wall motion. There is mild left ventricular hypertrophy. Left ventricular diastolic parameters are consistent with Grade I diastolic dysfunction (impaired relaxation).  2. Right ventricular systolic function is normal. The right ventricular size is normal. Tricuspid regurgitation signal is inadequate for assessing PA pressure.  3. The mitral valve is normal in structure. No evidence of mitral valve regurgitation. No evidence of mitral stenosis.  4. The aortic valve is normal in structure. Aortic valve regurgitation is not visualized. No aortic stenosis is present. FINDINGS  Left Ventricle: Left ventricular ejection fraction, by estimation, is 55 to 60%. The left ventricle has normal function. Left ventricular endocardial border not optimally defined to evaluate regional wall motion. The left ventricular internal cavity size was normal in size. There is mild left ventricular hypertrophy. Left ventricular diastolic parameters are consistent with Grade I diastolic dysfunction (impaired relaxation). Right Ventricle: The right ventricular size is normal. No increase in right ventricular wall thickness. Right ventricular systolic function is normal. Tricuspid regurgitation signal is inadequate for assessing PA pressure. Left Atrium: Left atrial size was normal in size. Right Atrium: Right atrial size was normal in size. Pericardium: There is no evidence of pericardial effusion. Mitral Valve: The mitral valve is normal in structure. No evidence of mitral valve regurgitation. No evidence of mitral valve stenosis. Tricuspid Valve: The tricuspid valve is normal in structure. Tricuspid valve  regurgitation is not demonstrated. No evidence of tricuspid stenosis. Aortic Valve: The aortic valve is normal in structure. Aortic valve regurgitation is not visualized. No aortic stenosis is present. Aortic valve mean gradient measures 5.5 mmHg. Aortic valve peak gradient measures 9.5 mmHg. Aortic valve area, by VTI measures 2.01 cm. Pulmonic Valve: The pulmonic valve was normal in structure. Pulmonic valve regurgitation is not visualized. No evidence of pulmonic stenosis. Aorta: The aortic root is normal in size and structure. Venous: The inferior vena cava was not well visualized. IAS/Shunts: No atrial level shunt detected by color flow Doppler.  LEFT VENTRICLE PLAX 2D LVIDd:         4.60 cm   Diastology LVIDs:         3.30 cm   LV e' medial:    8.27 cm/s LV PW:         1.70 cm   LV E/e' medial:  10.0 LV IVS:        1.30 cm   LV e' lateral:   9.90 cm/s LVOT diam:     2.30 cm   LV E/e' lateral: 8.4 LV SV:         51 LV SV Index:   17 LVOT Area:     4.15 cm  RIGHT VENTRICLE RV Basal diam:  4.60 cm RV Mid diam:    3.40 cm RV S prime:     18.10 cm/s TAPSE (M-mode): 1.6 cm LEFT ATRIUM             Index        RIGHT ATRIUM  Index LA diam:        2.70 cm 0.92 cm/m   RA Area:     23.60 cm LA Vol (A2C):   36.2 ml 12.29 ml/m  RA Volume:   69.40 ml  23.56 ml/m LA Vol (A4C):   32.6 ml 11.07 ml/m LA Biplane Vol: 32.0 ml 10.86 ml/m  AORTIC VALVE AV Area (Vmax):    2.21 cm AV Area (Vmean):   2.24 cm AV Area (VTI):     2.01 cm AV Vmax:           154.50 cm/s AV Vmean:          108.500 cm/s AV VTI:            0.254 m AV Peak Grad:      9.5 mmHg AV Mean Grad:      5.5 mmHg LVOT Vmax:         82.20 cm/s LVOT Vmean:        58.400 cm/s LVOT VTI:          0.123 m LVOT/AV VTI ratio: 0.48  AORTA Ao Root diam: 3.35 cm MITRAL VALVE                TRICUSPID VALVE MV Area (PHT): 9.14 cm     TR Peak grad:   17.6 mmHg MV Decel Time: 83 msec      TR Vmax:        210.00 cm/s MV E velocity: 82.80 cm/s MV A velocity: 105.00  cm/s  SHUNTS MV E/A ratio:  0.79         Systemic VTI:  0.12 m                             Systemic Diam: 2.30 cm Gary Sacramento MD Electronically signed by Gary Sacramento MD Signature Date/Time: 04/12/2022/9:49:09 AM    Final    DG Chest Port 1 View  Result Date: 04/11/2022 CLINICAL DATA:  66 year old male with history of shortness of breath. EXAM: PORTABLE CHEST 1 VIEW COMPARISON:  Chest x-ray 02/19/2022. FINDINGS: A tracheostomy tube is in place with tip 5.8 cm above the carina. Lung volumes are low. No confluent consolidative airspace disease. No definite pleural effusions. No pneumothorax. Cephalization of the pulmonary vasculature, with indistinct interstitial markings in the lungs bilaterally. Mild cardiomegaly. The patient is rotated to the right on today's exam, resulting in distortion of the mediastinal contours and reduced diagnostic sensitivity and specificity for mediastinal pathology. IMPRESSION: 1. Support apparatus, as above. 2. The appearance of the chest suggests mild congestive heart failure, as above. Electronically Signed   By: Vinnie Langton M.D.   On: 04/11/2022 06:54       Hosie Poisson M.D. [facility] 04/12/2022, 1:46 PM  Available via Epic secure chat 7am-7pm After 7 pm, please refer to night coverage provider listed on amion.

## 2022-04-12 NOTE — Progress Notes (Signed)
*  PRELIMINARY RESULTS* Echocardiogram 2D Echocardiogram has been performed.  Sherrie Sport 04/12/2022, 9:17 AM

## 2022-04-12 NOTE — ED Notes (Signed)
Green lab tube sent to lab as recollect

## 2022-04-12 NOTE — ED Notes (Signed)
Pt called front desk for help. States he wants to be suctioned.  Respiratory called.  When respiratory arrived he states he no longer wants to be suctioned.  Pt had a BM in bed, cleaned and changed linens.  Repositioned for comfort. Pt provided with remote and ice water as requested.

## 2022-04-12 NOTE — TOC Initial Note (Signed)
Transition of Care [facility]) - Initial/Assessment Note    Patient Details  Name: Gary [name] MRN: [id number] Date of Birth: 1955/12/24  [facility] CM/SW Contact:    Candie Chroman, LCSW Phone Number: 04/12/2022, 1:03 PM  Clinical Narrative:   CSW met with patient. No supports at bedside. CSW introduced role and explained that discharge planning would be discussed. Last admission, patient was set up with [facility] for 18.5 CNA hours, 7 days per week. CSW spoke with liaison, Lorriane Shire, who stated they have not started services yet and need to speak with patient first. She tried calling him after conversation but said she had to leave him a voicemail. CSW made patient aware and he said he will call her back after lunch. Patient was set up with [facility] for PT and OT last admission but they were unable to see him due to trach. Will try to find another agency to accept. No further concerns. CSW encouraged patient to contact CSW as needed. CSW will continue to follow patient for support and facilitate return home once stable. Patient confirmed he will need EMS transport home.               Expected Discharge Plan: [place] Barriers to Discharge: Continued Medical Work up   Patient Goals and CMS Choice            Expected Discharge Plan and Services     Post Acute Care Choice: [place] arrangements for the past 2 months: Single Family Home                                      Prior Living Arrangements/Services Living arrangements for the past 2 months: Single Family Home Lives with:: Self Patient language and need for interpreter reviewed:: Yes Do you feel safe going back to the place where you live?: Yes      Need for Family Participation in Patient Care: Yes (Comment) Care giver support system in place?: No (comment) Current home services: DME Criminal Activity/Legal Involvement Pertinent to Current  Situation/Hospitalization: No - Comment as needed  Activities of Daily Living      Permission Sought/Granted                  Emotional Assessment Appearance:: Appears stated age Attitude/Demeanor/Rapport: Engaged, Gracious Affect (typically observed): Accepting, Appropriate Orientation: : Oriented to Self, Oriented to Place, Oriented to  Time, Oriented to Situation Alcohol / Substance Use: Not Applicable Psych Involvement: No (comment)  Admission diagnosis:  Acute exacerbation of CHF (congestive heart failure) ([name]) [I50.9] Patient Active Problem List   Diagnosis Date Noted   Acute exacerbation of CHF (congestive heart failure) ([place]) 04/11/2022   Influenza A 04/11/2022   Hypokalemia 03/06/2022   Acute on chronic diastolic (congestive) heart failure ([place]) 02/03/2022   Chronic obstructive pulmonary disease (COPD) ([place]) 02/03/2022   GERD without esophagitis 02/03/2022   Anxiety and depression 02/03/2022   Dyslipidemia 02/03/2022   Bloody stool 11/12/2021   Major depressive disorder, recurrent episode, moderate ([place]) 10/22/2021   Pressure injury of skin 09/27/2021   Acute on chronic respiratory failure with hypercapnia ([place]) 08/07/2021   COPD (chronic obstructive pulmonary disease) ([name])    HLD (hyperlipidemia)    Iron deficiency anemia    Chronic kidney disease, stage 3a ([place]) 07/22/2021   Acute on chronic respiratory failure with hypoxia  and hypercapnia ([place]) 07/20/2021   Chronic obstructive pulmonary disease ([name])    Acute CHF (congestive heart failure) ([place]) 04/04/2021   Weakness    Acute respiratory failure with hypoxia and hypercapnia ([place]) 03/11/2021   (HFpEF) heart failure with preserved ejection fraction ([name])    Chronic respiratory failure with hypoxia ([place])    Hyperlipidemia 02/19/2021   Acute respiratory distress syndrome (ARDS) due to COVID-19 virus ([place])    Acute on chronic respiratory failure ([place]) 02/18/2021   Generalized osteoarthritis of multiple  sites 10/31/2020   AKI (acute kidney injury) ([place]) 03/04/2020   Hyperkalemia 03/04/2020   COPD with acute exacerbation ([place]) 03/02/2020   Marijuana use 01/17/2020   Thrombocytopenia ([place]) 01/17/2020   Positive hepatitis C antibody test 01/17/2020   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 mass index (BMI) of 50.0 to 59.9 in adult [facility]) 01/17/2020   Osteoarthritis of glenohumeral joints (Bilateral) 01/17/2020   Osteoarthritis of AC (acromioclavicular) joints (Bilateral) 01/17/2020   Polysubstance abuse ([place]) 91/66/0600   Toxic metabolic encephalopathy 45/99/7741   Chronic pain syndrome 07/14/2019   DDD (degenerative disc disease), lumbosacral 07/14/2019   DDD (degenerative disc disease), cervical 07/14/2019   CHF (congestive heart failure) ([place]) 04/12/2017   Acute on chronic diastolic CHF (congestive heart failure) ([place]) 02/28/2015   Obstructive sleep apnea 02/28/2015   Acute on chronic diastolic congestive heart failure ([name])    Acute on chronic respiratory failure with hypoxia ([place])    Morbid obesity with BMI of 50.0-59.9, adult ([place]) 04/01/2014   HTN (hypertension) 04/01/2014   PCP:  [facility] [place]:   [place], [place] - [address] [place] [address] Phone: [phone] Fax: [phone]     Social Determinants of Health (SDOH) Social History: SDOH Screenings   Food Insecurity: No Food Insecurity (02/05/2022)  Housing: Low Risk  (02/05/2022)  Transportation Needs: Unmet Transportation Needs (02/05/2022)  Utilities: At Risk (02/05/2022)  Depression (PHQ2-9): Low Risk  (06/07/2021)  Financial Resource Strain: Medium Risk (05/03/2017)  Physical Activity: Insufficiently Active (05/03/2017)  Social Connections: Moderately Integrated (05/03/2017)  Stress: No Stress Concern Present (05/03/2017)  Tobacco Use: Medium Risk (02/27/2022)   SDOH Interventions:     Readmission Risk  Interventions    12/31/2021    8:58 AM 12/20/2021    4:21 PM 05/04/2021    1:26 PM  Readmission Risk Prevention Plan  Transportation Screening Complete Complete Complete  Medication Review Press photographer) Complete Complete Complete  PCP or Specialist appointment within 3-5 days of discharge Complete Complete Complete  HRI or Home Care Consult Complete Complete Complete  SW Recovery Care/Counseling Consult Complete Complete Complete  Palliative Care Screening Complete Complete Not [place] Not Applicable Not Applicable Not Applicable

## 2022-04-12 NOTE — ED Notes (Signed)
Pt complains of headache 

## 2022-04-12 NOTE — ED Notes (Signed)
Pt's supplemental oxygen was not on trach, pt had dislodge the mask over trach providing 02, sats 90% on RA, supplemental humidified oxygen reapplied. Pt's trach was also suction, used 5m saline to help break up secretions, small amount of yellow/clear sputum return from catheter, pt tolerated well. Sats 95 % on trach

## 2022-04-12 NOTE — ED Notes (Signed)
Pt c/o /D.  Pt cleaned, linens changed, purewick changed. Pt repositioned and states he is comfortable at this time.

## 2022-04-12 NOTE — ED Notes (Signed)
Lab called needs another green top, reports tube hemolyzed

## 2022-04-12 NOTE — ED Notes (Signed)
ECHO at bedside.

## 2022-04-12 NOTE — ED Notes (Signed)
Pt appears to be comfortable and resting, can observe even RR that are unlabored, trach remains on humidified supplemental oxygen and cardiac monitoring devices, no changes noted, side rails up x2 for safety, NAD noted, plan of care ongoing, call light within reach, no further concerns as of present.

## 2022-04-13 ENCOUNTER — Inpatient Hospital Stay: Payer: Medicaid Other

## 2022-04-13 DIAGNOSIS — K219 Gastro-esophageal reflux disease without esophagitis: Secondary | ICD-10-CM | POA: Diagnosis not present

## 2022-04-13 DIAGNOSIS — J101 Influenza due to other identified influenza virus with other respiratory manifestations: Secondary | ICD-10-CM | POA: Diagnosis not present

## 2022-04-13 DIAGNOSIS — I5033 Acute on chronic diastolic (congestive) heart failure: Secondary | ICD-10-CM | POA: Diagnosis not present

## 2022-04-13 LAB — BASIC METABOLIC PANEL
Anion gap: 5 (ref 5–15)
BUN: 27 mg/dL — ABNORMAL HIGH (ref 8–23)
CO2: 26 mmol/L (ref 22–32)
Calcium: 8.3 mg/dL — ABNORMAL LOW (ref 8.9–10.3)
Chloride: 110 mmol/L (ref 98–111)
Creatinine, Ser: 1.37 mg/dL — ABNORMAL HIGH (ref 0.61–1.24)
GFR, Estimated: 58 mL/min — ABNORMAL LOW (ref >60)
Glucose, Bld: 84 mg/dL (ref 70–99)
Potassium: 3.8 mmol/L (ref 3.5–5.1)
Sodium: 141 mmol/L (ref 135–145)

## 2022-04-13 LAB — C DIFFICILE QUICK SCREEN W PCR REFLEX
C Diff antigen: NEGATIVE
C Diff interpretation: NOT DETECTED
C Diff toxin: NEGATIVE

## 2022-04-13 LAB — GASTROINTESTINAL PANEL BY PCR, STOOL (REPLACES STOOL CULTURE)
Adenovirus F40/41: NOT DETECTED
Astrovirus: NOT DETECTED
Campylobacter species: NOT DETECTED
Cryptosporidium: NOT DETECTED
Cyclospora cayetanensis: NOT DETECTED
Entamoeba histolytica: NOT DETECTED
Enteroaggregative E coli (EAEC): NOT DETECTED
Enteropathogenic E coli (EPEC): DETECTED — AB
Enterotoxigenic E coli (ETEC): NOT DETECTED
Giardia lamblia: NOT DETECTED
Norovirus GI/GII: NOT DETECTED
Plesimonas shigelloides: NOT DETECTED
Rotavirus A: NOT DETECTED
Salmonella species: NOT DETECTED
Sapovirus (I, II, IV, and V): NOT DETECTED
Shiga like toxin producing E coli (STEC): NOT DETECTED
Shigella/Enteroinvasive E coli (EIEC): NOT DETECTED
Vibrio cholerae: NOT DETECTED
Vibrio species: NOT DETECTED
Yersinia enterocolitica: NOT DETECTED

## 2022-04-13 LAB — PROCALCITONIN: Procalcitonin: <0.1 ng/mL

## 2022-04-13 MED ORDER — CIPROFLOXACIN IN D5W 400 MG/200ML IV SOLN
400.0000 mg | Freq: Two times a day (BID) | INTRAVENOUS | Status: DC
  Administered 2022-04-13 – 2022-04-14 (×2): 400 mg via INTRAVENOUS
  Filled 2022-04-13 (×3): qty 200

## 2022-04-13 NOTE — ED Notes (Signed)
Bed change, emptied purewick canister.  Collected stool sample and sent to lab.   Pt drinking contrast for CT.

## 2022-04-13 NOTE — ED Notes (Signed)
Pt reports /D episode.  Pt cleaned, linen changed, pt repositioned.  Pt comfortable at this time.

## 2022-04-13 NOTE — Progress Notes (Signed)
PT Cancellation Note  Patient Details Name: Gary [name] MRN: [id number] DOB: [birth year]   Cancelled Treatment:    Reason Eval/Treat Not Completed: Patient declined to participate with PT services despite max encouragement and education on physiological benefits of activity.  Pt declined all activity including sitting up to EOB or participating with therex, nursing notified.  Will attempt to see pt at a future date/time as medically appropriate.    Linus Salmons PT, DPT 04/13/22, 12:10 PM

## 2022-04-13 NOTE — ED Notes (Signed)
Dr. Karleen Hampshire made aware of stool sample results

## 2022-04-13 NOTE — Progress Notes (Addendum)
[facility]                                                                               Chistopher Mangino, is a 66 y.o. male, DOB - [birth year], HYW:[id number] Admit date - 04/11/2022    Outpatient Primary MD for the patient is [facility] [place]  LOS - 2  days    Brief summary    [name] is a 66 year old male with history of morbid obesity, GERD, hyperlipidemia, chronic respiratory failure status post tracheostomy, COPD, hypertension, who presents emergency department for chief concerns of shortness of breath. His CXR showed CHF and he also tested positive for influenza A. He was admitted and started on IV lasix. Today he reported having loose watery bowel movements and some abdominal cramps.  CT abd and pelvis ordered and GI panel to be sent.      Assessment & Plan    Assessment and Plan: * Acute exacerbation of CHF (congestive heart failure) ([name]) Probably secondary to noncompliance to medications and diet Patient was started on Lasix 40 mg twice daily, transition to 40 mg daily, echocardiogram  done and showed preserved LVEF and grade 1 diastolic dysfunction.  Strict intake and output and daily weights. Diuresed about 1.9 L since admission. He continues to have significant lower extremity edema and some sob.  Monitor creatinine while on Lasix. Creatinine stable at 1.3     Nausea, lower abdominal pain , with loose watery bowel movements - GI panel and C Diff PCR ordered. CT abdomen and pelvis ordered for further evaluation.    Major depressive disorder, recurrent episode, moderate ([name]) Continue with home escitalopram  Chronic obstructive pulmonary disease (COPD) ([place]) Scattered wheezing heard on exam today.  Supportive management with bronchodilators and Combivent.   HTN (hypertension) Blood pressure parameters are well-controlled  Morbid obesity with BMI of 50.0-59.9, adult ([name]) Poor prognostic factor  Influenza A Patient tested  positive for influenza a but he is outside the window for benefits of Tamiflu Chest x-ray does not show any pneumonia, only  shows mild edema suspicious for CHF  Moisture associated skin damage - Wound care consulted  Chronic respiratory failure with hypoxia ([place]) - Continue tracheostomy - Continue DuoNebs every 6 hours as needed for wheezing and shortness of breath    Anemia of chronic disease:  Hemoglobin stable around 10.   Mld thrombocytopenia Monitor.    Therapy evaluations ordered.   Addendum:   [facility]PEC positive GI panel. Starting the patient on IV ciprofloxacin BID.   Estimated body mass index is 50 kg/m as calculated from the following:   Height as of this encounter: '6\' 3"'$  (1.905 m).   Weight as of this encounter: 181.4 kg.  Code Status: Full code DVT Prophylaxis:  apixaban (ELIQUIS) tablet 2.5 mg Start: 04/11/22 1530 Place TED hose Start: 04/11/22 1357 apixaban (ELIQUIS) tablet 2.5 mg   Level of Care: Level of care: Stepdown Family Communication: None at bedside  Disposition Plan:     Remains inpatient appropriate: Possibly home in the next 24 hours after being diuresed  Procedures:  None  Consultants:   None  Antimicrobials:   Anti-infectives (From admission, onward)  None        Medications  Scheduled Meds:  apixaban  2.5 mg Oral BID   escitalopram  5 mg Oral Daily   ferrous sulfate  325 mg Oral Q breakfast   furosemide  40 mg Intravenous Daily   ipratropium-albuterol  3 mL Nebulization BID   melatonin  5 mg Oral QHS   Continuous Infusions: PRN Meds:.acetaminophen **OR** acetaminophen, haloperidol lactate, hydrALAZINE, ipratropium-albuterol, LORazepam, ondansetron **OR** ondansetron (ZOFRAN) IV    Subjective:   [name] was seen and examined today.  Diarrhea, some abdominal pain, some nausea. No chest pain.   Objective:   Vitals:   04/13/22 0600 04/13/22 0630 04/13/22 0914 04/13/22 1207  BP:  108/72 120/87 108/75  Pulse:  90 89 93 92  Resp:  19 (!) 25 20  Temp:    98 F (36.7 C)  TempSrc:    Oral  SpO2: 96% 94% 94% 93%  Weight:      Height:        Intake/Output Summary (Last 24 hours) at 04/13/2022 1457 Last data filed at 04/13/2022 1122 Gross per 24 hour  Intake --  Output 400 ml  Net -400 ml    Filed Weights   04/11/22 0604  Weight: (!) 181.4 kg     Exam. General exam: ill appearing gentleman,s/p trach on [place] oxygen  Respiratory system: diminished air entry at bases. S/p trach, scattered wheezing posteriorly.  Cardiovascular system: S1 & S2 heard, RRR. No JVD, murmurs, Gastrointestinal system: Abdomen is soft, mildly tender in the lower abdomen.  Central nervous system: Alert and oriented. No focal neurological deficits. Extremities: pedal edema.  Skin: No rashes,  Psychiatry: Mood & affect appropriate.    Data Reviewed:  I have personally reviewed following labs and imaging studies   CBC Lab Results  Component Value Date   WBC 5.9 04/12/2022   RBC 4.74 04/12/2022   HGB 10.3 (L) 04/12/2022   HCT 38.6 (L) 04/12/2022   MCV 81.4 04/12/2022   MCH 21.7 (L) 04/12/2022   PLT 135 (L) 04/12/2022   MCHC 26.7 (L) 04/12/2022   RDW 19.7 (H) 04/12/2022   LYMPHSABS 0.6 (L) 04/11/2022   MONOABS 0.7 04/11/2022   EOSABS 0.1 04/11/2022   BASOSABS 0.0 49/44/9675     Last metabolic panel Lab Results  Component Value Date   NA 141 04/13/2022   K 3.8 04/13/2022   CL 110 04/13/2022   CO2 26 04/13/2022   BUN 27 (H) 04/13/2022   CREATININE 1.37 (H) 04/13/2022   GLUCOSE 84 04/13/2022   GFRNONAA 58 (L) 04/13/2022   GFRAA 91 03/15/2020   CALCIUM 8.3 (L) 04/13/2022   PHOS 3.1 02/12/2022   PROT 7.9 04/11/2022   ALBUMIN 3.1 (L) 04/11/2022   BILITOT 0.8 04/11/2022   ALKPHOS 94 04/11/2022   AST 21 04/11/2022   ALT 10 04/11/2022   ANIONGAP 5 04/13/2022    CBG (last 3)  No results for input(s): "GLUCAP" in the last 72 hours.    Coagulation Profile: No results for input(s): "INR",  "PROTIME" in the last 168 hours.   Radiology Studies: ECHOCARDIOGRAM COMPLETE  Result Date: 04/12/2022    ECHOCARDIOGRAM REPORT   Patient Name:   Gary [name] Date of Exam: 04/12/2022 Medical Rec #:  [id number]      Height:       75.0 in Accession #:    [id number]     Weight:       400.0 lb Date of Birth:  1955-08-29  BSA:          2.946 m Patient Age:    44 years       BP:           Not listed in chart/Not listed in                                              chart mmHg Patient Gender: M              HR:           Not listed in chart bpm. Exam Location:  [facility] Procedure: 2D Echo, Cardiac Doppler and Color Doppler Indications:     Dyspnea R06.00  History:         Patient has prior history of Echocardiogram examinations, most                  recent 07/18/2021. COPD; Risk Factors:Hypertension. Tobacco use.  Sonographer:     Sherrie Sport Referring Phys:  [id number] AMY N [facility] Diagnosing Phys: Kathlyn Sacramento MD  Sonographer Comments: Suboptimal apical window. IMPRESSIONS  1. Left ventricular ejection fraction, by estimation, is 55 to 60%. The left ventricle has normal function. Left ventricular endocardial border not optimally defined to evaluate regional wall motion. There is mild left ventricular hypertrophy. Left ventricular diastolic parameters are consistent with Grade I diastolic dysfunction (impaired relaxation).  2. Right ventricular systolic function is normal. The right ventricular size is normal. Tricuspid regurgitation signal is inadequate for assessing PA pressure.  3. The mitral valve is normal in structure. No evidence of mitral valve regurgitation. No evidence of mitral stenosis.  4. The aortic valve is normal in structure. Aortic valve regurgitation is not visualized. No aortic stenosis is present. FINDINGS  Left Ventricle: Left ventricular ejection fraction, by estimation, is 55 to 60%. The left ventricle has normal function. Left ventricular endocardial border not optimally defined to evaluate  regional wall motion. The left ventricular internal cavity size was normal in size. There is mild left ventricular hypertrophy. Left ventricular diastolic parameters are consistent with Grade I diastolic dysfunction (impaired relaxation). Right Ventricle: The right ventricular size is normal. No increase in right ventricular wall thickness. Right ventricular systolic function is normal. Tricuspid regurgitation signal is inadequate for assessing PA pressure. Left Atrium: Left atrial size was normal in size. Right Atrium: Right atrial size was normal in size. Pericardium: There is no evidence of pericardial effusion. Mitral Valve: The mitral valve is normal in structure. No evidence of mitral valve regurgitation. No evidence of mitral valve stenosis. Tricuspid Valve: The tricuspid valve is normal in structure. Tricuspid valve regurgitation is not demonstrated. No evidence of tricuspid stenosis. Aortic Valve: The aortic valve is normal in structure. Aortic valve regurgitation is not visualized. No aortic stenosis is present. Aortic valve mean gradient measures 5.5 mmHg. Aortic valve peak gradient measures 9.5 mmHg. Aortic valve area, by VTI measures 2.01 cm. Pulmonic Valve: The pulmonic valve was normal in structure. Pulmonic valve regurgitation is not visualized. No evidence of pulmonic stenosis. Aorta: The aortic root is normal in size and structure. Venous: The inferior vena cava was not well visualized. IAS/Shunts: No atrial level shunt detected by color flow Doppler.  LEFT VENTRICLE PLAX 2D LVIDd:         4.60 cm   Diastology LVIDs:         3.30  cm   LV e' medial:    8.27 cm/s LV PW:         1.70 cm   LV E/e' medial:  10.0 LV IVS:        1.30 cm   LV e' lateral:   9.90 cm/s LVOT diam:     2.30 cm   LV E/e' lateral: 8.4 LV SV:         51 LV SV Index:   17 LVOT Area:     4.15 cm  RIGHT VENTRICLE RV Basal diam:  4.60 cm RV Mid diam:    3.40 cm RV S prime:     18.10 cm/s TAPSE (M-mode): 1.6 cm LEFT ATRIUM              Index        RIGHT ATRIUM           Index LA diam:        2.70 cm 0.92 cm/m   RA Area:     23.60 cm LA Vol (A2C):   36.2 ml 12.29 ml/m  RA Volume:   69.40 ml  23.56 ml/m LA Vol (A4C):   32.6 ml 11.07 ml/m LA Biplane Vol: 32.0 ml 10.86 ml/m  AORTIC VALVE AV Area (Vmax):    2.21 cm AV Area (Vmean):   2.24 cm AV Area (VTI):     2.01 cm AV Vmax:           154.50 cm/s AV Vmean:          108.500 cm/s AV VTI:            0.254 m AV Peak Grad:      9.5 mmHg AV Mean Grad:      5.5 mmHg LVOT Vmax:         82.20 cm/s LVOT Vmean:        58.400 cm/s LVOT VTI:          0.123 m LVOT/AV VTI ratio: 0.48  AORTA Ao Root diam: 3.35 cm MITRAL VALVE                TRICUSPID VALVE MV Area (PHT): 9.14 cm     TR Peak grad:   17.6 mmHg MV Decel Time: 83 msec      TR Vmax:        210.00 cm/s MV E velocity: 82.80 cm/s MV A velocity: 105.00 cm/s  SHUNTS MV E/A ratio:  0.79         Systemic VTI:  0.12 m                             Systemic Diam: 2.30 cm Kathlyn Sacramento MD Electronically signed by Kathlyn Sacramento MD Signature Date/Time: 04/12/2022/9:49:09 AM    Final        Hosie Poisson M.D. [facility] 04/13/2022, 2:57 PM  Available via Epic secure chat 7am-7pm After 7 pm, please refer to night coverage provider listed on amion.

## 2022-04-13 NOTE — ED Notes (Signed)
This RN brought patient to CT scan after drinking contrast - once at CT the patient REFUSED imaging   Notified Dr. Karleen [place]

## 2022-04-13 NOTE — ED Notes (Signed)
Pt has had 1 bottle of contrast, encouraged patient to finish 2nd bottle.

## 2022-04-13 NOTE — TOC Progression Note (Signed)
Transition of Care [facility]) - Progression Note    Patient Details  Name: Gary [name] MRN: [id number] Date of Birth: 1955-09-05  [facility]) CM/SW Contact  Shelbie Hutching, RN Phone Number: 04/13/2022, 9:02 AM  Clinical Narrative:    Reached out to Carol Ada with [facility] Medicaid via email to see if she can assist with ensuring home care services for next discharge.    Expected Discharge Plan: [place] Barriers to Discharge: Continued Medical Work up  Expected Discharge Plan and Services     Post Acute Care Choice: [place] arrangements for the past 2 months: [place] Determinants of Health (SDOH) Interventions SDOH Screenings   Food Insecurity: No Food Insecurity (02/05/2022)  Housing: Low Risk  (02/05/2022)  Transportation Needs: Unmet Transportation Needs (02/05/2022)  Utilities: At Risk (02/05/2022)  Depression (PHQ2-9): Low Risk  (06/07/2021)  Financial Resource Strain: Medium Risk (05/03/2017)  Physical Activity: Insufficiently Active (05/03/2017)  Social Connections: Moderately Integrated (05/03/2017)  Stress: No Stress Concern Present (05/03/2017)  Tobacco Use: Medium Risk (02/27/2022)    Readmission Risk Interventions    12/31/2021    8:58 AM 12/20/2021    4:21 PM 05/04/2021    1:26 PM  Readmission Risk Prevention Plan  Transportation Screening Complete Complete Complete  Medication Review Press photographer) Complete Complete Complete  PCP or Specialist appointment within 3-5 days of discharge Complete Complete Complete  HRI or Home Care Consult Complete Complete Complete  SW Recovery Care/Counseling Consult Complete Complete Complete  Palliative Care Screening Complete Complete Not [place] Not Applicable Not Applicable Not Applicable

## 2022-04-14 DIAGNOSIS — N182 Chronic kidney disease, stage 2 (mild): Secondary | ICD-10-CM

## 2022-04-14 DIAGNOSIS — I5033 Acute on chronic diastolic (congestive) heart failure: Secondary | ICD-10-CM | POA: Diagnosis not present

## 2022-04-14 DIAGNOSIS — N179 Acute kidney failure, unspecified: Secondary | ICD-10-CM | POA: Diagnosis not present

## 2022-04-14 LAB — CBC WITH DIFFERENTIAL/PLATELET
Abs Immature Granulocytes: 0.01 10*3/uL (ref 0.00–0.07)
Basophils Absolute: 0 10*3/uL (ref 0.0–0.1)
Basophils Relative: 0 %
Eosinophils Absolute: 0 10*3/uL (ref 0.0–0.5)
Eosinophils Relative: 1 %
HCT: 39.8 % (ref 39.0–52.0)
Hemoglobin: 11 g/dL — ABNORMAL LOW (ref 13.0–17.0)
Immature Granulocytes: 0 %
Lymphocytes Relative: 22 %
Lymphs Abs: 0.9 10*3/uL (ref 0.7–4.0)
MCH: 21.6 pg — ABNORMAL LOW (ref 26.0–34.0)
MCHC: 27.6 g/dL — ABNORMAL LOW (ref 30.0–36.0)
MCV: 78.2 fL — ABNORMAL LOW (ref 80.0–100.0)
Monocytes Absolute: 0.6 10*3/uL (ref 0.1–1.0)
Monocytes Relative: 15 %
Neutro Abs: 2.5 10*3/uL (ref 1.7–7.7)
Neutrophils Relative %: 62 %
Platelets: 164 10*3/uL (ref 150–400)
RBC: 5.09 MIL/uL (ref 4.22–5.81)
RDW: 18.6 % — ABNORMAL HIGH (ref 11.5–15.5)
WBC: 4 10*3/uL (ref 4.0–10.5)
nRBC: 0 % (ref 0.0–0.2)

## 2022-04-14 LAB — MAGNESIUM: Magnesium: 2.3 mg/dL (ref 1.7–2.4)

## 2022-04-14 LAB — COMPREHENSIVE METABOLIC PANEL
ALT: 14 U/L (ref 0–44)
AST: 16 U/L (ref 15–41)
Albumin: 2.8 g/dL — ABNORMAL LOW (ref 3.5–5.0)
Alkaline Phosphatase: 75 U/L (ref 38–126)
Anion gap: 6 (ref 5–15)
BUN: 30 mg/dL — ABNORMAL HIGH (ref 8–23)
CO2: 26 mmol/L (ref 22–32)
Calcium: 8.3 mg/dL — ABNORMAL LOW (ref 8.9–10.3)
Chloride: 107 mmol/L (ref 98–111)
Creatinine, Ser: 1.39 mg/dL — ABNORMAL HIGH (ref 0.61–1.24)
GFR, Estimated: 57 mL/min — ABNORMAL LOW (ref >60)
Glucose, Bld: 85 mg/dL (ref 70–99)
Potassium: 3.7 mmol/L (ref 3.5–5.1)
Sodium: 139 mmol/L (ref 135–145)
Total Bilirubin: 0.5 mg/dL (ref 0.3–1.2)
Total Protein: 7.5 g/dL (ref 6.5–8.1)

## 2022-04-14 MED ORDER — FUROSEMIDE 10 MG/ML IJ SOLN
20.0000 mg | Freq: Two times a day (BID) | INTRAMUSCULAR | Status: DC
  Administered 2022-04-14 – 2022-04-16 (×5): 20 mg via INTRAVENOUS
  Filled 2022-04-14 (×5): qty 2

## 2022-04-14 NOTE — Progress Notes (Signed)
Pt placed on home non invasive ventilator with 2L O2 bleed in. Pt stated he feels fine. SPO2 95%, HR 103, RR 16. Home unit plugged in red outlet.

## 2022-04-14 NOTE — Progress Notes (Signed)
PT Cancellation Note  Patient Details Name: Gary [name] MRN: [id number] DOB: [birth year]   Cancelled Treatment:    Reason Eval/Treat Not Completed: Patient declined. Pt states "Thanks for coming by to try to see me, but I'm too weak. This flu is no joke."  Encouraged pt to try some mobility or exercises to no avail, he did ostensibly agree to try to at least get up to sitting EOB tomorrow, but feels he'll be too weak to really try standing.     Kreg Shropshire, DPT 04/14/2022, 2:55 PM

## 2022-04-14 NOTE — Progress Notes (Signed)
[facility]                                                                               Gary [name], is a 66 y.o. male, DOB - 03/03/1956, [id number] Admit date - 04/11/2022    Outpatient Primary MD for the patient is [facility] [place]  LOS - 3  days    Brief summary   66 year old male with history of morbid obesity, GERD, stage II CKD, chronic respiratory failure status post tracheostomy on 5 L oxygen, COPD, and chronic diastolic heart failure, who presents emergency department on 12/27 for shortness of breath times several days.  Patient found to be in acute CHF as well as positive for the flu.  Patient started on IV Lasix.     Assessment & Plan    Assessment and Plan: * Acute exacerbation of CHF (congestive heart failure) ([name]) On Lasix.  Since admission, has diuresed more than 3 L and is more than -2.5 L deficient.  Major depressive disorder, recurrent episode, moderate ([name]) Continue with home escitalopram  AKI in the setting of stage II chronic kidney disease: Improvement with diuretics, not yet back to baseline.  Chronic obstructive pulmonary disease (COPD) ([place]) Supportive management with bronchodilators and Combivent.  HTN (hypertension) Blood pressure parameters are well-controlled  Morbid obesity with BMI  Meets criteria with BMI greater than 40  Influenza A Patient tested positive for influenza a but he is outside the window for benefits of Tamiflu Chest x-ray does not show any pneumonia shows mild edema suspicious for CHF  Moisture associated skin damage - Wound care consulted  Chronic respiratory failure with hypoxia ([place]) - Continue tracheostomy - Continue DuoNebs every 6 hours as needed for wheezing and shortness of breath, at baseline on 5 L  Therapy evaluations ordered  Estimated body mass index is 44.2 kg/m as calculated from the following:   Height as of this encounter: '6\' 3"'$  (1.905 m).   Weight as of this  encounter: 160.4 kg.  Code Status: Full code DVT Prophylaxis:  apixaban (ELIQUIS) tablet 2.5 mg Start: 04/11/22 1530 Place TED hose Start: 04/11/22 1357 apixaban (ELIQUIS) tablet 2.5 mg   Level of Care: Level of care: Progressive Family Communication: None at bedside  Disposition Plan:     Remains inpatient appropriate: Once fully diuresed.  Potential discharge 12/31 or 1/1  Procedures:  None  Consultants:   None  Antimicrobials:   12/29 - 12/30  Medications  Scheduled Meds:  apixaban  2.5 mg Oral BID   escitalopram  5 mg Oral Daily   ferrous sulfate  325 mg Oral Q breakfast   furosemide  20 mg Intravenous BID   melatonin  5 mg Oral QHS   Continuous Infusions:  ciprofloxacin 400 mg (04/14/22 0947)   PRN Meds:.acetaminophen **OR** acetaminophen, haloperidol lactate, hydrALAZINE, ipratropium-albuterol, LORazepam, ondansetron **OR** ondansetron (ZOFRAN) IV    Subjective:   Breathing a little more rough today.  Objective:   Vitals:   04/14/22 0536 04/14/22 0725 04/14/22 0829 04/14/22 1238  BP: 114/78  126/87 (!) 131/90  Pulse: 99  (!) 104 95  Resp: '18  19 19  '$ Temp: 98.9 F (37.2 C)  98.1 F (36.7 C) 98.2 F (36.8 C)  TempSrc: Oral     SpO2: 99% 95% 96% 97%  Weight:    (!) 160.4 kg  Height:        Intake/Output Summary (Last 24 hours) at 04/14/2022 1641 Last data filed at 04/14/2022 9767 Gross per 24 hour  Intake 440 ml  Output 650 ml  Net -210 ml    Filed Weights   04/11/22 0604 04/14/22 1238  Weight: (!) 181.4 kg (!) 160.4 kg     Exam. General exam: Alert and oriented x 3, no acute distress HEENT: Normocephalic, tracheostomy Respiratory system: Decreased breath sounds throughout with mild end expiratory wheeze Cardiovascular system: S1 & S2 heard, RRR.  Gastrointestinal system: Abdomen is nondistended, soft and nontender. Central nervous system: Alert and oriented. No focal neurological deficits. Extremities: Trace edema Skin: No  rashes Psychiatry:  Mood & affect appropriate.    Data Reviewed:  I have personally reviewed following labs and imaging studies Lab work noteworthy for creatinine of 1.39  Radiology Studies: No results found.     Annita Brod M.D. [facility] 04/14/2022, 4:41 PM  Available via Epic secure chat 7am-7pm After 7 pm, please refer to night coverage provider listed on amion.

## 2022-04-15 ENCOUNTER — Encounter: Payer: Self-pay | Admitting: Internal Medicine

## 2022-04-15 DIAGNOSIS — J101 Influenza due to other identified influenza virus with other respiratory manifestations: Secondary | ICD-10-CM | POA: Diagnosis not present

## 2022-04-15 DIAGNOSIS — I5033 Acute on chronic diastolic (congestive) heart failure: Secondary | ICD-10-CM | POA: Diagnosis not present

## 2022-04-15 DIAGNOSIS — J9611 Chronic respiratory failure with hypoxia: Secondary | ICD-10-CM | POA: Diagnosis not present

## 2022-04-15 NOTE — Progress Notes (Signed)
[facility]                                                                               Gary [name], is a 66 y.o. male, DOB - [birth year], [id number] Admit date - 04/11/2022    Outpatient Primary MD for the patient is [facility] [place]  LOS - 4  days    Brief summary   66 year old male with history of morbid obesity, GERD, stage II CKD, chronic respiratory failure status post tracheostomy on 5 L oxygen, COPD, and chronic diastolic heart failure, who presents emergency department on 12/27 for shortness of breath times several days.  Patient found to be in acute CHF as well as positive for the flu.  Patient started on IV Lasix.     Assessment & Plan    Assessment and Plan: * Acute exacerbation of CHF (congestive heart failure) ([name]) On Lasix.  Since admission, has diuresed more than 4 L and is more than -3.5 L deficient.  Major depressive disorder, recurrent episode, moderate ([name]) Continue with home escitalopram  AKI in the setting of stage II chronic kidney disease: Improvement with diuretics, not yet back to baseline.  Chronic obstructive pulmonary disease (COPD) ([place]) Supportive management with bronchodilators and Combivent.  HTN (hypertension) Blood pressure parameters are well-controlled  Morbid obesity with BMI  Meets criteria with BMI greater than 40  Influenza A Patient tested positive for influenza a but he is outside the window for benefits of Tamiflu Chest x-ray does not show any pneumonia shows mild edema suspicious for CHF  Moisture associated skin damage - Wound care consulted  Chronic respiratory failure with hypoxia ([place]) - Continue tracheostomy - Continue DuoNebs every 6 hours as needed for wheezing and shortness of breath, at baseline on 5 L  Therapy evaluations ordered  Estimated body mass index is 44.2 kg/m as calculated from the following:   Height as of this encounter: '6\' 3"'$  (1.905 m).   Weight as of this  encounter: 160.4 kg.  Code Status: Full code DVT Prophylaxis:  apixaban (ELIQUIS) tablet 2.5 mg Start: 04/11/22 1530 Place TED hose Start: 04/11/22 1357 apixaban (ELIQUIS) tablet 2.5 mg   Level of Care: Level of care: Progressive Family Communication: None at bedside  Disposition Plan:     Remains inpatient appropriate:  Once fully diuresed.  -Needs to be seen by physical therapy  Procedures:  None  Consultants:   None  Antimicrobials:   12/29 - 12/30  Medications  Scheduled Meds:  apixaban  2.5 mg Oral BID   escitalopram  5 mg Oral Daily   ferrous sulfate  325 mg Oral Q breakfast   furosemide  20 mg Intravenous BID   melatonin  5 mg Oral QHS   Continuous Infusions:  PRN Meds:.acetaminophen **OR** acetaminophen, haloperidol lactate, hydrALAZINE, ipratropium-albuterol, LORazepam, ondansetron **OR** ondansetron (ZOFRAN) IV    Subjective:   Breathing is fair  Objective:   Vitals:   04/15/22 0125 04/15/22 0450 04/15/22 0524 04/15/22 0841  BP: 137/88  (!) 141/82 122/87  Pulse: 87  91 93  Resp: '16  19 18  '$ Temp: 98.1 F (36.7 C)  98 F (36.7 C) 98.3 F (36.8  C)  TempSrc: Oral   Oral  SpO2: 94% 94% 98% 96%  Weight:      Height:        Intake/Output Summary (Last 24 hours) at 04/15/2022 1409 Last data filed at 04/15/2022 1100 Gross per 24 hour  Intake 0 ml  Output 900 ml  Net -900 ml    Filed Weights   04/11/22 0604 04/14/22 1238  Weight: (!) 181.4 kg (!) 160.4 kg     Exam. General exam: Alert and oriented x 3, no acute distress HEENT: Normocephalic, tracheostomy Respiratory system: Decreased breath sounds throughout with mild end expiratory wheeze Cardiovascular system: S1 & S2 heard, RRR.  Gastrointestinal system: Abdomen is nondistended, soft and nontender. Central nervous system: Alert and oriented. No focal neurological deficits. Extremities: Trace edema Skin: No rashes Psychiatry:  Mood & affect appropriate.    Data Reviewed:  I have  personally reviewed following labs and imaging studies No labs today.  Radiology Studies: No results found.     Annita Brod M.D. [facility] 04/15/2022, 2:09 PM  Available via Epic secure chat 7am-7pm After 7 pm, please refer to night coverage provider listed on amion.

## 2022-04-15 NOTE — Progress Notes (Signed)
PT Cancellation Note  Patient Details Name: Gary [name] MRN: [id number] DOB: [birth year]   Cancelled Treatment:    Reason Eval/Treat Not Completed: Patient declined, no reason specified Attempted to see pt x 2 this afternoon.  First time he was needing to have a BM and wanted nursing to get him a bed pan.  (He did ultimately go and cleaned up with assist from nursing).  Attempted again later and he said "I am not getting up today, I can't."  Despite significant encouragement, education on the importance of activity/not laying in bed and trying to facilitate safe return to home he ultimately refused again and again.  Later stating "If I get up I'll $hit all over myself, I'm not going to do that."  Pt refused even supine bed exercises when offered.  He states if he doesn't have diarrhea tonight and he's feeling better he might tomorrow.  Pt well known to this PT from recent prolonged hospitalization, today with similar reasons for refusal as he has many times in the past.  Kreg Shropshire, DPT 04/15/2022, 5:36 PM

## 2022-04-16 ENCOUNTER — Ambulatory Visit: Admission: RE | Admit: 2022-04-16 | Payer: Medicaid Other | Source: Ambulatory Visit

## 2022-04-16 DIAGNOSIS — F331 Major depressive disorder, recurrent, moderate: Secondary | ICD-10-CM | POA: Diagnosis not present

## 2022-04-16 DIAGNOSIS — J101 Influenza due to other identified influenza virus with other respiratory manifestations: Secondary | ICD-10-CM | POA: Diagnosis not present

## 2022-04-16 DIAGNOSIS — I5033 Acute on chronic diastolic (congestive) heart failure: Secondary | ICD-10-CM | POA: Diagnosis not present

## 2022-04-16 LAB — BASIC METABOLIC PANEL
Anion gap: 7 (ref 5–15)
BUN: 19 mg/dL (ref 8–23)
CO2: 28 mmol/L (ref 22–32)
Calcium: 8.4 mg/dL — ABNORMAL LOW (ref 8.9–10.3)
Chloride: 106 mmol/L (ref 98–111)
Creatinine, Ser: 1.1 mg/dL (ref 0.61–1.24)
GFR, Estimated: >60 mL/min (ref >60)
Glucose, Bld: 87 mg/dL (ref 70–99)
Potassium: 3.3 mmol/L — ABNORMAL LOW (ref 3.5–5.1)
Sodium: 141 mmol/L (ref 135–145)

## 2022-04-16 LAB — CULTURE, BLOOD (ROUTINE X 2)
Culture: NO GROWTH
Special Requests: ADEQUATE

## 2022-04-16 MED ORDER — POTASSIUM CHLORIDE CRYS ER 20 MEQ PO TBCR
40.0000 meq | EXTENDED_RELEASE_TABLET | Freq: Once | ORAL | Status: AC
  Administered 2022-04-16: 40 meq via ORAL
  Filled 2022-04-16: qty 2

## 2022-04-16 NOTE — Discharge Summary (Signed)
Physician Discharge Summary   Patient: Gary [name] MRN: [id number] DOB: 1956/02/22  Admit date:     04/11/2022  Discharge date: 04/16/22  Discharge Physician: Annita Brod   PCP: [facility]   Recommendations at discharge:   Resuming home health  Discharge Diagnoses: Principal Problem:   Acute exacerbation of CHF (congestive heart failure) ([place]) Active Problems:   Major depressive disorder, recurrent episode, moderate ([name])   Positive hepatitis C antibody test   HTN (hypertension)   Thrombocytopenia ([name])   Chronic obstructive pulmonary disease (COPD) ([place])   Morbid obesity with BMI of 50.0-59.9, adult ([name])   GERD without esophagitis   CHF (congestive heart failure) ([place])   Hyperlipidemia   Chronic respiratory failure with hypoxia ([place])   Pressure injury of skin   Influenza A  Resolved Problems:   * No resolved hospital problems. *  Hospital Course: 67 year old male with history of morbid obesity, GERD, stage II CKD, chronic respiratory failure status post tracheostomy on 5 L oxygen, COPD, and chronic diastolic heart failure, who presents emergency department on 12/27 for shortness of breath times several days. Patient found to be in acute CHF as well as positive for the flu. Patient started on IV Lasix.   Assessment and Plan: * Acute exacerbation of CHF (congestive heart failure) ([name]) On Lasix.  Since admission, has diuresed 4.5 L and was more than -4 L deficient.   Major depressive disorder, recurrent episode, moderate ([name]) Continue with home escitalopram   AKI in the setting of stage II chronic kidney disease-resolved: Improved steadily with diuretics and by 1/1, back to baseline.   Chronic obstructive pulmonary disease (COPD) ([place]) Supportive management with bronchodilators and Combivent.   HTN (hypertension) Blood pressure parameters are well-controlled   Morbid obesity with BMI  Meets criteria with BMI greater than 40    Influenza A Patient tested positive for influenza a but he is outside the window for benefits of Tamiflu   Moisture associated skin damage - Wound care consulted   Chronic respiratory failure with hypoxia ([place]) - Continue tracheostomy - Continue DuoNebs every 6 hours as needed for wheezing and shortness of breath, at baseline on 5 L   PT and OT attempted to evaluate patient several times, but he would continue to refuse.        Consultants: None Procedures performed: None Disposition: Home Diet recommendation:  Discharge Diet Orders (From admission, onward)     Start     Ordered   04/16/22 0000  Diet - low sodium heart healthy        04/16/22 1154           Cardiac and Carb modified diet DISCHARGE MEDICATION: Allergies as of 04/16/2022   No Known Allergies      Medication List     TAKE these medications    apixaban 2.5 MG Tabs tablet Commonly known as: ELIQUIS Take 1 tablet (2.5 mg total) by mouth 2 (two) times daily.   bisacodyl 10 MG suppository Commonly known as: DULCOLAX Place 1 suppository (10 mg total) rectally daily as needed for moderate constipation.   Combivent Respimat 20-100 MCG/ACT Aers respimat Generic drug: Ipratropium-Albuterol Inhale 2 puffs into the lungs in the morning and at bedtime.   ipratropium-albuterol 0.5-2.5 (3) MG/3ML Soln Commonly known as: DUONEB Take 3 mLs by nebulization every 6 (six) hours as needed.   escitalopram 5 MG tablet Commonly known as: LEXAPRO Take 1 tablet (5 mg total) by mouth daily.   ferrous  sulfate 325 (65 FE) MG tablet Take 1 tablet (325 mg total) by mouth daily with breakfast. Home med   hydrOXYzine 25 MG tablet Commonly known as: ATARAX Take 1 tablet (25 mg total) by mouth 3 (three) times daily as needed for anxiety.   melatonin 5 MG Tabs Take 1 tablet (5 mg total) by mouth at bedtime.   pantoprazole 40 MG tablet Commonly known as: PROTONIX Take 1 tablet (40 mg total) by mouth 2 (two) times  daily before a meal.   polyethylene glycol 17 g packet Commonly known as: MIRALAX / GLYCOLAX Take 17 g by mouth daily as needed for moderate constipation.   simvastatin 10 MG tablet Commonly known as: ZOCOR Take 1 tablet (10 mg total) by mouth daily at 6 PM.   torsemide 20 MG tablet Commonly known as: DEMADEX Take 1 tablet (20 mg total) by mouth daily.        Discharge Exam: Filed Weights   04/11/22 0604 04/14/22 1238  Weight: (!) 181.4 kg (!) 160.4 kg   General: Alert and oriented x 3 Cardiovascular: Regular rate and rhythm, S1-S2 Lungs: Decreased breath sounds throughout  Condition at discharge: improving  The results of significant diagnostics from this hospitalization (including imaging, microbiology, ancillary and laboratory) are listed below for reference.   Imaging Studies: ECHOCARDIOGRAM COMPLETE  Result Date: 04/12/2022    ECHOCARDIOGRAM REPORT   Patient Name:   Gary [name] Date of Exam: 04/12/2022 Medical Rec #:  [id number]      Height:       75.0 in Accession #:    [id number]     Weight:       400.0 lb Date of Birth:  [birth year]      BSA:          2.946 m Patient Age:    66 years       BP:           Not listed in chart/Not listed in                                              chart mmHg Patient Gender: M              HR:           Not listed in chart bpm. Exam Location:  [facility] Procedure: 2D Echo, Cardiac Doppler and Color Doppler Indications:     Dyspnea R06.00  History:         Patient has prior history of Echocardiogram examinations, most                  recent 07/18/2021. COPD; Risk Factors:Hypertension. Tobacco use.  Sonographer:     Sherrie Sport Referring Phys:  [id number] AMY N COX Diagnosing Phys: Kathlyn Sacramento MD  Sonographer Comments: Suboptimal apical window. IMPRESSIONS  1. Left ventricular ejection fraction, by estimation, is 55 to 60%. The left ventricle has normal function. Left ventricular endocardial border not optimally defined to evaluate regional wall  motion. There is mild left ventricular hypertrophy. Left ventricular diastolic parameters are consistent with Grade I diastolic dysfunction (impaired relaxation).  2. Right ventricular systolic function is normal. The right ventricular size is normal. Tricuspid regurgitation signal is inadequate for assessing PA pressure.  3. The mitral valve is normal in structure. No evidence of mitral valve regurgitation. No evidence of mitral stenosis.  4. The aortic valve is normal in structure. Aortic valve regurgitation is not visualized. No aortic stenosis is present. FINDINGS  Left Ventricle: Left ventricular ejection fraction, by estimation, is 55 to 60%. The left ventricle has normal function. Left ventricular endocardial border not optimally defined to evaluate regional wall motion. The left ventricular internal cavity size was normal in size. There is mild left ventricular hypertrophy. Left ventricular diastolic parameters are consistent with Grade I diastolic dysfunction (impaired relaxation). Right Ventricle: The right ventricular size is normal. No increase in right ventricular wall thickness. Right ventricular systolic function is normal. Tricuspid regurgitation signal is inadequate for assessing PA pressure. Left Atrium: Left atrial size was normal in size. Right Atrium: Right atrial size was normal in size. Pericardium: There is no evidence of pericardial effusion. Mitral Valve: The mitral valve is normal in structure. No evidence of mitral valve regurgitation. No evidence of mitral valve stenosis. Tricuspid Valve: The tricuspid valve is normal in structure. Tricuspid valve regurgitation is not demonstrated. No evidence of tricuspid stenosis. Aortic Valve: The aortic valve is normal in structure. Aortic valve regurgitation is not visualized. No aortic stenosis is present. Aortic valve mean gradient measures 5.5 mmHg. Aortic valve peak gradient measures 9.5 mmHg. Aortic valve area, by VTI measures 2.01 cm.  Pulmonic Valve: The pulmonic valve was normal in structure. Pulmonic valve regurgitation is not visualized. No evidence of pulmonic stenosis. Aorta: The aortic root is normal in size and structure. Venous: The inferior vena cava was not well visualized. IAS/Shunts: No atrial level shunt detected by color flow Doppler.  LEFT VENTRICLE PLAX 2D LVIDd:         4.60 cm   Diastology LVIDs:         3.30 cm   LV e' medial:    8.27 cm/s LV PW:         1.70 cm   LV E/e' medial:  10.0 LV IVS:        1.30 cm   LV e' lateral:   9.90 cm/s LVOT diam:     2.30 cm   LV E/e' lateral: 8.4 LV SV:         51 LV SV Index:   17 LVOT Area:     4.15 cm  RIGHT VENTRICLE RV Basal diam:  4.60 cm RV Mid diam:    3.40 cm RV S prime:     18.10 cm/s TAPSE (M-mode): 1.6 cm LEFT ATRIUM             Index        RIGHT ATRIUM           Index LA diam:        2.70 cm 0.92 cm/m   RA Area:     23.60 cm LA Vol (A2C):   36.2 ml 12.29 ml/m  RA Volume:   69.40 ml  23.56 ml/m LA Vol (A4C):   32.6 ml 11.07 ml/m LA Biplane Vol: 32.0 ml 10.86 ml/m  AORTIC VALVE AV Area (Vmax):    2.21 cm AV Area (Vmean):   2.24 cm AV Area (VTI):     2.01 cm AV Vmax:           154.50 cm/s AV Vmean:          108.500 cm/s AV VTI:            0.254 m AV Peak Grad:      9.5 mmHg AV Mean Grad:      5.5 mmHg LVOT  Vmax:         82.20 cm/s LVOT Vmean:        58.400 cm/s LVOT VTI:          0.123 m LVOT/AV VTI ratio: 0.48  AORTA Ao Root diam: 3.35 cm MITRAL VALVE                TRICUSPID VALVE MV Area (PHT): 9.14 cm     TR Peak grad:   17.6 mmHg MV Decel Time: 83 msec      TR Vmax:        210.00 cm/s MV E velocity: 82.80 cm/s MV A velocity: 105.00 cm/s  SHUNTS MV E/A ratio:  0.79         Systemic VTI:  0.12 m                             Systemic Diam: 2.30 cm Kathlyn Sacramento MD Electronically signed by Kathlyn Sacramento MD Signature Date/Time: 04/12/2022/9:49:09 AM    Final    DG Chest Port 1 View  Result Date: 04/11/2022 CLINICAL DATA:  67 year old male with history of shortness  of breath. EXAM: PORTABLE CHEST 1 VIEW COMPARISON:  Chest x-ray 02/19/2022. FINDINGS: A tracheostomy tube is in place with tip 5.8 cm above the carina. Lung volumes are low. No confluent consolidative airspace disease. No definite pleural effusions. No pneumothorax. Cephalization of the pulmonary vasculature, with indistinct interstitial markings in the lungs bilaterally. Mild cardiomegaly. The patient is rotated to the right on today's exam, resulting in distortion of the mediastinal contours and reduced diagnostic sensitivity and specificity for mediastinal pathology. IMPRESSION: 1. Support apparatus, as above. 2. The appearance of the chest suggests mild congestive heart failure, as above. Electronically Signed   By: Vinnie Langton M.D.   On: 04/11/2022 06:54    Microbiology: Results for orders placed or performed during the hospital encounter of 04/11/22  Culture, blood (routine x 2)     Status: None   Collection Time: 04/11/22  6:12 AM   Specimen: BLOOD  Result Value Ref Range Status   Specimen Description BLOOD BLOOD LEFT FOREARM  Final   Special Requests   Final    BOTTLES DRAWN AEROBIC AND ANAEROBIC Blood Culture adequate volume   Culture   Final    NO GROWTH 5 DAYS Performed at [facility], [address] [place],  [address]    Report Status 04/16/2022 FINAL  Final  Resp panel by RT-PCR (RSV, Flu A&B, Covid) Anterior Nasal Swab     Status: Abnormal   Collection Time: 04/11/22  6:12 AM   Specimen: Anterior Nasal Swab  Result Value Ref Range Status   SARS Coronavirus 2 by RT PCR NEGATIVE NEGATIVE Final    Comment: (NOTE) SARS-CoV-2 target nucleic acids are NOT DETECTED.  The SARS-CoV-2 RNA is generally detectable in upper respiratory specimens during the acute phase of infection. The lowest concentration of SARS-CoV-2 viral copies this assay can detect is 138 copies/mL. A negative result does not preclude SARS-Cov-2 infection and should not be used as the sole  basis for treatment or other patient management decisions. A negative result may occur with  improper specimen collection/handling, submission of specimen other than nasopharyngeal swab, presence of viral mutation(s) within the areas targeted by this assay, and inadequate number of viral copies(<138 copies/mL). A negative result must be combined with clinical observations, patient history, and epidemiological information. The expected result is Negative.  Fact  Sheet for Patients:  EntrepreneurPulse.com.au  Fact Sheet for Healthcare Providers:  IncredibleEmployment.be  This test is no t yet approved or cleared by the [place] FDA and  has been authorized for detection and/or diagnosis of SARS-CoV-2 by FDA under an Emergency Use Authorization (EUA). This EUA will remain  in effect (meaning this test can be used) for the duration of the COVID-19 declaration under Section 564(b)(1) of the Act, 21 U.S.C.section 360bbb-3(b)(1), unless the authorization is terminated  or revoked sooner.       Influenza A by PCR POSITIVE (A) NEGATIVE Final   Influenza B by PCR NEGATIVE NEGATIVE Final    Comment: (NOTE) The Xpert Xpress SARS-CoV-2/FLU/RSV plus assay is intended as an aid in the diagnosis of influenza from Nasopharyngeal swab specimens and should not be used as a sole basis for treatment. Nasal washings and aspirates are unacceptable for Xpert Xpress SARS-CoV-2/FLU/RSV testing.  Fact Sheet for Patients: EntrepreneurPulse.com.au  Fact Sheet for Healthcare Providers: IncredibleEmployment.be  This test is not yet approved or cleared by the [place] FDA and has been authorized for detection and/or diagnosis of SARS-CoV-2 by FDA under an Emergency Use Authorization (EUA). This EUA will remain in effect (meaning this test can be used) for the duration of the COVID-19 declaration under Section 564(b)(1) of the  Act, 21 U.S.C. section 360bbb-3(b)(1), unless the authorization is terminated or revoked.     Resp Syncytial Virus by PCR NEGATIVE NEGATIVE Final    Comment: (NOTE) Fact Sheet for Patients: EntrepreneurPulse.com.au  Fact Sheet for Healthcare Providers: IncredibleEmployment.be  This test is not yet approved or cleared by the [place] FDA and has been authorized for detection and/or diagnosis of SARS-CoV-2 by FDA under an Emergency Use Authorization (EUA). This EUA will remain in effect (meaning this test can be used) for the duration of the COVID-19 declaration under Section 564(b)(1) of the Act, 21 U.S.C. section 360bbb-3(b)(1), unless the authorization is terminated or revoked.  Performed at [facility], [address], [place], [place] [address]   Urine Culture     Status: Abnormal   Collection Time: 04/11/22  9:12 AM   Specimen: Urine, Clean Catch  Result Value Ref Range Status   Specimen Description   Final    URINE, CLEAN CATCH Performed at [facility], [address], [place], [place] [address]    Special Requests   Final    NONE Performed at [facility], [address], [place], [place] [address]    Culture (A)  Final    <10,000 COLONIES/mL INSIGNIFICANT GROWTH Performed at [place] [address] [place],  [address]    Report Status 04/12/2022 FINAL  Final  Gastrointestinal Panel by PCR , Stool     Status: Abnormal   Collection Time: 04/13/22 12:01 PM   Specimen: Stool  Result Value Ref Range Status   Campylobacter species NOT DETECTED NOT DETECTED Final   Plesimonas shigelloides NOT DETECTED NOT DETECTED Final   Salmonella species NOT DETECTED NOT DETECTED Final   Yersinia enterocolitica NOT DETECTED NOT DETECTED Final   Vibrio species NOT DETECTED NOT DETECTED Final   Vibrio cholerae NOT DETECTED NOT DETECTED Final   Enteroaggregative E coli (EAEC) NOT DETECTED NOT  DETECTED Final   Enteropathogenic E coli (EPEC) DETECTED (A) NOT DETECTED Final    Comment: RESULT CALLED TO, READ BACK BY AND VERIFIED WITH: DANA PELLACK AT 1540 04/13/22.PMF    Enterotoxigenic E coli (ETEC) NOT DETECTED NOT DETECTED Final  Shiga like toxin producing E coli (STEC) NOT DETECTED NOT DETECTED Final   Shigella/Enteroinvasive E coli (EIEC) NOT DETECTED NOT DETECTED Final   Cryptosporidium NOT DETECTED NOT DETECTED Final   Cyclospora cayetanensis NOT DETECTED NOT DETECTED Final   Entamoeba histolytica NOT DETECTED NOT DETECTED Final   Giardia lamblia NOT DETECTED NOT DETECTED Final   Adenovirus F40/41 NOT DETECTED NOT DETECTED Final   Astrovirus NOT DETECTED NOT DETECTED Final   Norovirus GI/GII NOT DETECTED NOT DETECTED Final   Rotavirus A NOT DETECTED NOT DETECTED Final   Sapovirus (I, II, IV, and V) NOT DETECTED NOT DETECTED Final    Comment: Performed at [facility], [address], [place], [place] [address]  C Difficile Quick Screen w PCR reflex     Status: None   Collection Time: 04/13/22  3:15 PM   Specimen: STOOL  Result Value Ref Range Status   C Diff antigen NEGATIVE NEGATIVE Final   C Diff toxin NEGATIVE NEGATIVE Final   C Diff interpretation No C. difficile detected.  Final    Comment: Performed at [facility], [address] [place], [place] [address]    Labs: CBC: Recent Labs  Lab 04/11/22 0612 04/12/22 0421 04/14/22 0528  WBC 6.8 5.9 4.0  NEUTROABS 5.4  --  2.5  HGB 10.5* 10.3* 11.0*  HCT 38.7* 38.6* 39.8  MCV 80.1 81.4 78.2*  PLT 198 135* 710   Basic Metabolic Panel: Recent Labs  Lab 04/11/22 0612 04/12/22 0525 04/13/22 0150 04/14/22 0528 04/16/22 0614  NA 142 140 141 139 141  K 4.4 3.7 3.8 3.7 3.3*  CL 110 110 110 107 106  CO2 '26 24 26 26 28  '$ GLUCOSE 87 91 84 85 87  BUN 21 24* 27* 30* 19  CREATININE 1.05 1.37* 1.37* 1.39* 1.10  CALCIUM 8.6* 8.3* 8.3* 8.3* 8.4*  MG  --   --   --  2.3  --    Liver Function  Tests: Recent Labs  Lab 04/11/22 0612 04/14/22 0528  AST 21 16  ALT 10 14  ALKPHOS 94 75  BILITOT 0.8 0.5  PROT 7.9 7.5  ALBUMIN 3.1* 2.8*   CBG: No results for input(s): "GLUCAP" in the last 168 hours.  Discharge time spent: less than 30 minutes.  Signed: Annita Brod, MD [facility] 04/16/2022

## 2022-04-16 NOTE — Therapy (Addendum)
Pt on 28% ATC.  Not ready for his home vent. Pt in no distress.

## 2022-04-16 NOTE — TOC Transition Note (Addendum)
Transition of Care [facility]) - CM/SW Discharge Note   Patient Details  Name: SISTO GRANILLO MRN: [id number] Date of Birth: 1955-11-23  Transition of Care [facility]) CM/SW Contact:  Tiburcio Bash, LCSW Phone Number: 04/16/2022, 12:09 PM   Clinical Narrative:     Update : Lorriane Shire with [facility] called this CSW back to report she did talk to patient and they plan to start services tomorrow 1/2.     Per MD patient ready to dc home today. CSW has informed MD that although call has been made today to [facility] ([facility]) at [phone] there has been no confirmation that they will start their CNA services 7 days a week. This was previously set up but agency had difficulties contacting patient. MD reports safe to discharge home without this confirmation.   Patient requires [facility], this has been called and ems forms are on chart.   Final next level of care: Home/Self Care Barriers to Discharge: No Barriers Identified   Patient Goals and CMS Choice      Discharge Placement                         Discharge Plan and Services Additional resources added to the After Visit Summary for       Post Acute Care Choice: Home Health                               Social Determinants of Health (SDOH) Interventions SDOH Screenings   Food Insecurity: No Food Insecurity (04/15/2022)  Housing: Low Risk  (04/15/2022)  Transportation Needs: Unmet Transportation Needs (04/15/2022)  Utilities: Not At Risk (04/15/2022)  Recent Concern: Utilities - At Risk (02/05/2022)  Depression (PHQ2-9): Low Risk  (06/07/2021)  Financial Resource Strain: Medium Risk (05/03/2017)  Physical Activity: Insufficiently Active (05/03/2017)  Social Connections: Moderately Integrated (05/03/2017)  Stress: No Stress Concern Present (05/03/2017)  Tobacco Use: Medium Risk (04/15/2022)     Readmission Risk Interventions    12/31/2021    8:58 AM 12/20/2021    4:21 PM 05/04/2021    1:26 PM   Readmission Risk Prevention Plan  Transportation Screening Complete Complete Complete  Medication Review Press photographer) Complete Complete Complete  PCP or Specialist appointment within 3-5 days of discharge Complete Complete Complete  HRI or Home Care Consult Complete Complete Complete  SW Recovery Care/Counseling Consult Complete Complete Complete  Palliative Care Screening Complete Complete Not [place] Not Applicable Not Applicable Not Applicable

## 2022-04-26 ENCOUNTER — Inpatient Hospital Stay
Admission: EM | Admit: 2022-04-26 | Discharge: 2022-07-26 | DRG: 207 | Disposition: A | Discharge status: Skilled Nursing Facility | Payer: Medicare Other | Attending: Internal Medicine | Admitting: Internal Medicine

## 2022-04-26 ENCOUNTER — Emergency Department: Payer: Medicare Other

## 2022-04-26 ENCOUNTER — Other Ambulatory Visit: Payer: Self-pay

## 2022-04-26 DIAGNOSIS — I5032 Chronic diastolic (congestive) heart failure: Secondary | ICD-10-CM | POA: Diagnosis present

## 2022-04-26 DIAGNOSIS — R1010 Upper abdominal pain, unspecified: Secondary | ICD-10-CM | POA: Diagnosis present

## 2022-04-26 DIAGNOSIS — F419 Anxiety disorder, unspecified: Secondary | ICD-10-CM | POA: Diagnosis present

## 2022-04-26 DIAGNOSIS — M7989 Other specified soft tissue disorders: Secondary | ICD-10-CM | POA: Diagnosis present

## 2022-04-26 DIAGNOSIS — G8929 Other chronic pain: Secondary | ICD-10-CM | POA: Diagnosis present

## 2022-04-26 DIAGNOSIS — G4730 Sleep apnea, unspecified: Secondary | ICD-10-CM | POA: Diagnosis present

## 2022-04-26 DIAGNOSIS — Z79899 Other long term (current) drug therapy: Secondary | ICD-10-CM

## 2022-04-26 DIAGNOSIS — R5381 Other malaise: Secondary | ICD-10-CM | POA: Diagnosis present

## 2022-04-26 DIAGNOSIS — M25512 Pain in left shoulder: Secondary | ICD-10-CM | POA: Diagnosis present

## 2022-04-26 DIAGNOSIS — Z751 Person awaiting admission to adequate facility elsewhere: Secondary | ICD-10-CM

## 2022-04-26 DIAGNOSIS — Z833 Family history of diabetes mellitus: Secondary | ICD-10-CM

## 2022-04-26 DIAGNOSIS — I428 Other cardiomyopathies: Secondary | ICD-10-CM | POA: Diagnosis present

## 2022-04-26 DIAGNOSIS — E785 Hyperlipidemia, unspecified: Secondary | ICD-10-CM | POA: Diagnosis present

## 2022-04-26 DIAGNOSIS — Z8616 Personal history of COVID-19: Secondary | ICD-10-CM

## 2022-04-26 DIAGNOSIS — Z8711 Personal history of peptic ulcer disease: Secondary | ICD-10-CM

## 2022-04-26 DIAGNOSIS — Z930 Tracheostomy status: Secondary | ICD-10-CM

## 2022-04-26 DIAGNOSIS — Z823 Family history of stroke: Secondary | ICD-10-CM

## 2022-04-26 DIAGNOSIS — J9621 Acute and chronic respiratory failure with hypoxia: Principal | ICD-10-CM | POA: Diagnosis present

## 2022-04-26 DIAGNOSIS — Z6841 Body Mass Index (BMI) 40.0 and over, adult: Secondary | ICD-10-CM

## 2022-04-26 DIAGNOSIS — Z7901 Long term (current) use of anticoagulants: Secondary | ICD-10-CM

## 2022-04-26 DIAGNOSIS — I130 Hypertensive heart and chronic kidney disease with heart failure and stage 1 through stage 4 chronic kidney disease, or unspecified chronic kidney disease: Secondary | ICD-10-CM | POA: Diagnosis present

## 2022-04-26 DIAGNOSIS — N1831 Chronic kidney disease, stage 3a: Secondary | ICD-10-CM | POA: Diagnosis present

## 2022-04-26 DIAGNOSIS — I5030 Unspecified diastolic (congestive) heart failure: Secondary | ICD-10-CM | POA: Diagnosis present

## 2022-04-26 DIAGNOSIS — R531 Weakness: Secondary | ICD-10-CM

## 2022-04-26 DIAGNOSIS — F32A Depression, unspecified: Secondary | ICD-10-CM | POA: Diagnosis present

## 2022-04-26 DIAGNOSIS — J440 Chronic obstructive pulmonary disease with acute lower respiratory infection: Secondary | ICD-10-CM | POA: Diagnosis present

## 2022-04-26 DIAGNOSIS — Z87891 Personal history of nicotine dependence: Secondary | ICD-10-CM

## 2022-04-26 DIAGNOSIS — J209 Acute bronchitis, unspecified: Secondary | ICD-10-CM | POA: Diagnosis present

## 2022-04-26 DIAGNOSIS — Z9049 Acquired absence of other specified parts of digestive tract: Secondary | ICD-10-CM

## 2022-04-26 DIAGNOSIS — M79604 Pain in right leg: Secondary | ICD-10-CM | POA: Diagnosis present

## 2022-04-26 DIAGNOSIS — J9622 Acute and chronic respiratory failure with hypercapnia: Secondary | ICD-10-CM | POA: Diagnosis not present

## 2022-04-26 DIAGNOSIS — G4733 Obstructive sleep apnea (adult) (pediatric): Secondary | ICD-10-CM | POA: Diagnosis present

## 2022-04-26 DIAGNOSIS — J441 Chronic obstructive pulmonary disease with (acute) exacerbation: Secondary | ICD-10-CM | POA: Diagnosis not present

## 2022-04-26 DIAGNOSIS — Z1152 Encounter for screening for COVID-19: Secondary | ICD-10-CM

## 2022-04-26 DIAGNOSIS — D509 Iron deficiency anemia, unspecified: Secondary | ICD-10-CM | POA: Diagnosis present

## 2022-04-26 DIAGNOSIS — J3489 Other specified disorders of nose and nasal sinuses: Secondary | ICD-10-CM | POA: Diagnosis present

## 2022-04-26 DIAGNOSIS — K219 Gastro-esophageal reflux disease without esophagitis: Secondary | ICD-10-CM | POA: Diagnosis present

## 2022-04-26 DIAGNOSIS — M25511 Pain in right shoulder: Secondary | ICD-10-CM | POA: Diagnosis present

## 2022-04-26 DIAGNOSIS — R1084 Generalized abdominal pain: Principal | ICD-10-CM

## 2022-04-26 DIAGNOSIS — R269 Unspecified abnormalities of gait and mobility: Secondary | ICD-10-CM | POA: Diagnosis present

## 2022-04-26 DIAGNOSIS — M79605 Pain in left leg: Secondary | ICD-10-CM | POA: Diagnosis present

## 2022-04-26 LAB — HEPATIC FUNCTION PANEL
ALT: 14 U/L (ref 0–44)
AST: 13 U/L — ABNORMAL LOW (ref 15–41)
Albumin: 2.8 g/dL — ABNORMAL LOW (ref 3.5–5.0)
Alkaline Phosphatase: 71 U/L (ref 38–126)
Bilirubin, Direct: <0.1 mg/dL (ref 0.0–0.2)
Total Bilirubin: 0.6 mg/dL (ref 0.3–1.2)
Total Protein: 7 g/dL (ref 6.5–8.1)

## 2022-04-26 LAB — BASIC METABOLIC PANEL
Anion gap: 6 (ref 5–15)
BUN: 26 mg/dL — ABNORMAL HIGH (ref 8–23)
CO2: 34 mmol/L — ABNORMAL HIGH (ref 22–32)
Calcium: 8.6 mg/dL — ABNORMAL LOW (ref 8.9–10.3)
Chloride: 102 mmol/L (ref 98–111)
Creatinine, Ser: 1.09 mg/dL (ref 0.61–1.24)
GFR, Estimated: >60 mL/min (ref >60)
Glucose, Bld: 123 mg/dL — ABNORMAL HIGH (ref 70–99)
Potassium: 3.7 mmol/L (ref 3.5–5.1)
Sodium: 142 mmol/L (ref 135–145)

## 2022-04-26 LAB — CBC
HCT: 37.9 % — ABNORMAL LOW (ref 39.0–52.0)
Hemoglobin: 10.4 g/dL — ABNORMAL LOW (ref 13.0–17.0)
MCH: 21.9 pg — ABNORMAL LOW (ref 26.0–34.0)
MCHC: 27.4 g/dL — ABNORMAL LOW (ref 30.0–36.0)
MCV: 80 fL (ref 80.0–100.0)
Platelets: 206 10*3/uL (ref 150–400)
RBC: 4.74 MIL/uL (ref 4.22–5.81)
RDW: 18.6 % — ABNORMAL HIGH (ref 11.5–15.5)
WBC: 6.5 10*3/uL (ref 4.0–10.5)
nRBC: 0 % (ref 0.0–0.2)

## 2022-04-26 LAB — TROPONIN I (HIGH SENSITIVITY): Troponin I (High Sensitivity): 5 ng/L (ref <18)

## 2022-04-26 LAB — LIPASE, BLOOD: Lipase: 43 U/L (ref 11–51)

## 2022-04-26 MED ORDER — TORSEMIDE 20 MG PO TABS
20.0000 mg | ORAL_TABLET | Freq: Every day | ORAL | Status: DC
  Administered 2022-04-26 – 2022-07-10 (×75): 20 mg via ORAL
  Filled 2022-04-26 (×76): qty 1

## 2022-04-26 MED ORDER — MELATONIN 5 MG PO TABS
5.0000 mg | ORAL_TABLET | Freq: Every day | ORAL | Status: DC
  Administered 2022-04-26 – 2022-07-25 (×90): 5 mg via ORAL
  Filled 2022-04-26 (×91): qty 1

## 2022-04-26 MED ORDER — SIMVASTATIN 10 MG PO TABS: 10.0000 mg | ORAL_TABLET | Freq: Every day | ORAL | Status: DC

## 2022-04-26 MED ORDER — PANTOPRAZOLE SODIUM 40 MG PO TBEC
40.0000 mg | DELAYED_RELEASE_TABLET | Freq: Two times a day (BID) | ORAL | Status: DC
  Administered 2022-04-26 – 2022-07-26 (×178): 40 mg via ORAL
  Filled 2022-04-26 (×181): qty 1

## 2022-04-26 MED ORDER — FERROUS SULFATE 325 (65 FE) MG PO TABS
325.0000 mg | ORAL_TABLET | Freq: Every day | ORAL | Status: DC
  Administered 2022-04-27 – 2022-07-26 (×91): 325 mg via ORAL
  Filled 2022-04-26 (×91): qty 1

## 2022-04-26 MED ORDER — IPRATROPIUM-ALBUTEROL 0.5-2.5 (3) MG/3ML IN SOLN: 3.0000 mL | Freq: Four times a day (QID) | RESPIRATORY_TRACT | Status: DC | PRN

## 2022-04-26 MED ORDER — ESCITALOPRAM OXALATE 10 MG PO TABS
5.0000 mg | ORAL_TABLET | Freq: Every day | ORAL | Status: DC
  Administered 2022-04-26 – 2022-07-26 (×92): 5 mg via ORAL
  Filled 2022-04-26 (×94): qty 1

## 2022-04-26 MED ORDER — HEPARIN SODIUM (PORCINE) 5000 UNIT/ML IJ SOLN
5000.0000 [IU] | Freq: Three times a day (TID) | INTRAMUSCULAR | Status: DC
  Administered 2022-04-26 – 2022-04-30 (×10): 5000 [IU] via SUBCUTANEOUS
  Filled 2022-04-26 (×12): qty 1

## 2022-04-26 MED ORDER — HYDROXYZINE HCL 50 MG PO TABS
25.0000 mg | ORAL_TABLET | Freq: Three times a day (TID) | ORAL | Status: DC | PRN
  Administered 2022-04-27 – 2022-07-18 (×16): 25 mg via ORAL
  Filled 2022-04-26 (×20): qty 1

## 2022-04-26 MED ORDER — APIXABAN 2.5 MG PO TABS: 2.5000 mg | ORAL_TABLET | Freq: Two times a day (BID) | ORAL | Status: DC

## 2022-04-26 NOTE — ED Notes (Signed)
[facility] has come to patient bedside.

## 2022-04-26 NOTE — ED Provider Notes (Signed)
[facility] Provider Note    Event Date/Time   First MD Initiated Contact with Patient 04/26/22 1032     (approximate)   History   Chief Complaint Abdominal Pain   HPI  Gary [name] is a 67 y.o. male with past medical history of hypertension, COPD, CHF, chronic respiratory failure on 5 L via tracheostomy, CKD, GERD, and morbid obesity who presents to the ED complaining of abdominal pain.  Patient reports that he has been having pain in his upper abdomen ever since he tried to pull himself up in bed last night.  He states it felt like he may have pulled a muscle, but he denies any nausea, vomiting, diarrhea, or dysuria.  He does report feeling slightly short of breath but denies any pain in his chest.  He has not had any fevers or cough, has not noticed any pain or swelling in his legs.  He does state that he has been unable to get his medications since he was discharged from the hospital 10 days ago.  He reports difficulty caring for himself, states home health aide was arranged but she only comes for 2 hours a day and has not, on certain days that she has been unable to get a babysitter.  Patient reports he has been [place] to get any of his medications since he was discharged from the hospital.     Physical Exam   Triage Vital Signs: ED Triage Vitals  Enc Vitals Group     BP 04/26/22 1019 116/75     Pulse Rate 04/26/22 1019 86     Resp 04/26/22 1019 17     Temp 04/26/22 1019 97.9 F (36.6 C)     Temp Source 04/26/22 1019 Oral     SpO2 04/26/22 1019 94 %     Weight --      Height --      Head Circumference --      Peak Flow --      Pain Score 04/26/22 1014 0     Pain Loc --      Pain Edu? --      Excl. in [place]? --     Most recent vital signs: Vitals:   04/26/22 1330 04/26/22 1400  BP: 131/79 123/81  Pulse: 75 77  Resp: (!) 22 19  Temp:    SpO2: 94% 93%    Constitutional: Alert and oriented.  Morbidly obese. Eyes: Conjunctivae are  normal. Head: Atraumatic. Nose: No congestion/rhinnorhea. Mouth/Throat: Mucous membranes are moist.  Cardiovascular: Normal rate, regular rhythm. Grossly normal heart sounds.  2+ radial pulses bilaterally. Respiratory: Normal respiratory effort.  No retractions. Lungs CTAB. Gastrointestinal: Soft and nontender. No distention. Musculoskeletal: No lower extremity tenderness, 1+ pitting edema to knees bilaterally. Neurologic:  Normal speech and language. No gross focal neurologic deficits are appreciated.    ED Results / Procedures / Treatments   Labs (all labs ordered are listed, but only abnormal results are displayed) Labs Reviewed  BASIC METABOLIC PANEL - Abnormal; Notable for the following components:      Result Value   CO2 34 (*)    Glucose, Bld 123 (*)    BUN 26 (*)    Calcium 8.6 (*)    All other components within normal limits  CBC - Abnormal; Notable for the following components:   Hemoglobin 10.4 (*)    HCT 37.9 (*)    MCH 21.9 (*)    MCHC 27.4 (*)  RDW 18.6 (*)    All other components within normal limits  HEPATIC FUNCTION PANEL - Abnormal; Notable for the following components:   Albumin 2.8 (*)    AST 13 (*)    All other components within normal limits  LIPASE, BLOOD  TROPONIN I (HIGH SENSITIVITY)  TROPONIN I (HIGH SENSITIVITY)     EKG  ED ECG REPORT I, Blake Divine, the attending physician, personally viewed and interpreted this ECG.   Date: 04/26/2022  EKG Time: 10:14  Rate: 86  Rhythm: normal sinus rhythm  Axis: Normal  Intervals:first-degree A-V block   ST&T Change: None  RADIOLOGY Chest x-ray reviewed and interpreted by me with no infiltrate, edema, or effusion.  PROCEDURES:  Critical Care performed: No  Procedures   MEDICATIONS ORDERED IN ED: Medications  apixaban (ELIQUIS) tablet 2.5 mg (has no administration in time range)  escitalopram (LEXAPRO) tablet 5 mg (5 mg Oral Given 04/26/22 1400)  ipratropium-albuterol (DUONEB)  0.5-2.5 (3) MG/3ML nebulizer solution 3 mL (has no administration in time range)  hydrOXYzine (ATARAX) tablet 25 mg (has no administration in time range)  ferrous sulfate tablet 325 mg (has no administration in time range)  melatonin tablet 5 mg (has no administration in time range)  pantoprazole (PROTONIX) EC tablet 40 mg (has no administration in time range)  simvastatin (ZOCOR) tablet 10 mg (has no administration in time range)  torsemide (DEMADEX) tablet 20 mg (20 mg Oral Given 04/26/22 1401)     IMPRESSION / MDM / ASSESSMENT AND PLAN / ED COURSE  I reviewed the triage vital signs and the nursing notes.                              67 y.o. male with past medical history of hypertension, COPD, CHF, chronic hypoxic respiratory failure on 5 L via tracheostomy, CKD, GERD, and morbid obesity who presents to the ED complaining of abdominal pain since trying to shift in bed last night along with some difficulty breathing.  Patient's presentation is most consistent with acute presentation with potential threat to life or bodily function.  Differential diagnosis includes, but is not limited to, muscle strain, pancreatitis, cholecystitis, biliary colic, hepatitis, gastritis, UTI, electrolyte abnormality, AKI, ACS, PE, pneumonia, CHF exacerbation.  Patient nontoxic-appearing and in no acute distress, vital signs are unremarkable and he is maintaining oxygen saturations on his usual supplemental oxygen via tracheostomy.  He complains of pain in his upper abdomen since trying to shift in bed, but has a benign abdominal exam and we will hold off on CT imaging as I suspect muscle strain.  With his difficulty breathing, EKG shows no evidence of arrhythmia or ischemia, will further assess with chest x-ray.  Labs are pending at this time.  Labs are unremarkable with renal function stable compared to previous and no significant electrolyte abnormality noted.  No significant anemia or leukocytosis noted,  troponin within normal limits.  Chest x-ray is clear with no evidence of CHF or other acute process.  Patient's primary problem appears to be limited ability to care for himself at home, had prior agreement where his girlfriend and sister would contribute to care but this does not appear to be happening.  We will place consult for PT and social work for potential placement versus increased home health care.      FINAL CLINICAL IMPRESSION(S) / ED DIAGNOSES   Final diagnoses:  Generalized abdominal pain  Generalized weakness  Rx / DC Orders   ED Discharge Orders     None        Note:  This document was prepared using Dragon voice recognition software and may include unintentional dictation errors.   Blake Divine, MD 04/26/22 1407

## 2022-04-26 NOTE — ED Notes (Signed)
Patient repositioned in bed and given warm blankets. Urine canister emptied at this time.

## 2022-04-26 NOTE — ED Triage Notes (Signed)
Pt presents to ED via [facility] with c/o of ABD pain for CP. Pt states he is also here because he can't care for himself. Pt has the expectation he can reside in the ICU "because I stayed there for 3 months." Pt educated that is not what the ICU is used for, pt will more than likely need SNF/rehab placement. Pt is not able to do anything for himself. Pt is here frequently.  Pt does have a trach with 02 at 6L/min, pt shows no s/s of distress. Pt is A&Ox4.   Pt is very demanding in triage on how and what he needs and what should be ordered for him.

## 2022-04-26 NOTE — TOC Initial Note (Signed)
Transition of Care [facility]) - Initial/Assessment Note    Patient Details  Name: Gary [name] MRN: [id number] Date of Birth: Feb 04, 1956  Transition of Care [facility]) CM/SW Contact:    Shelbie Hutching, RN Phone Number: 04/26/2022, 1:55 PM  Clinical Narrative:                 Patient arrives back to the emergency room with reports of not being able to care for himself at home and no one to care for him.  APS worker Dellia Nims is at the bedside, they just received a case on him, [phone]- they will not be able to do much as patient has full capacity.  She will reach out to [facility] as this RNCM has been unsuccessful in the past with getting in touch with their admissions.  For placement patient needs a facility that will accept a trach patient on a vent as patient requires trilogy vent at night.   Patient is currently on the phone with Medicare to see if he can get Medicare as he has been disabled and receiving SSDI for a while.   MD will order therapy.  RNCM will look for placement.  Patient does not need to be admitted to the hospital.    Expected Discharge Plan: Skilled Nursing Facility Barriers to Discharge: ED Unsafe disposition   Patient Goals and CMS Choice Patient states their goals for this hospitalization and ongoing recovery are:: patient cannot take care of himself wants placement CMS Medicare.gov Compare Post Acute Care list provided to:: Patient Choice offered to / list presented to : Patient      Expected Discharge Plan and Services   Discharge Planning Services: CM Consult Post Acute Care Choice: [place] Living arrangements for the past 2 months: Single Family Home                 DME Arranged: N/A DME Agency: NA                  Prior Living Arrangements/Services Living arrangements for the past 2 months: Single Family Home Lives with:: Significant Other Patient language and need for interpreter reviewed:: Yes Do you feel  safe going back to the place where you live?: No   cant take care of himself and girlfriend left  Need for Family Participation in Patient Care: Yes (Comment) Care giver support system in place?: No (comment) Current home services: DME (trilogy, oxygen, hoyer lift, hospital bed, wheelchair, walker) Criminal Activity/Legal Involvement Pertinent to Current Situation/Hospitalization: No - Comment as needed  Activities of Daily Living      Permission Sought/Granted                  Emotional Assessment Appearance:: Appears stated age Attitude/Demeanor/Rapport: Engaged, Complaining   Orientation: : Oriented to Self, Oriented to Place, Oriented to  Time, Oriented to Situation Alcohol / Substance Use: Not Applicable Psych Involvement: No (comment)  Admission diagnosis:  abd pain, ems Patient Active Problem List   Diagnosis Date Noted   Acute exacerbation of CHF (congestive heart failure) ([place]) 04/11/2022   Influenza A 04/11/2022   Hypokalemia 03/06/2022   Acute on chronic diastolic (congestive) heart failure ([place]) 02/03/2022   Chronic obstructive pulmonary disease (COPD) ([place]) 02/03/2022   GERD without esophagitis 02/03/2022   Anxiety and depression 02/03/2022   Dyslipidemia 02/03/2022   Bloody stool 11/12/2021   Major depressive disorder, recurrent episode, moderate ([place]) 10/22/2021   Pressure injury of skin 09/27/2021  Acute on chronic respiratory failure with hypercapnia ([place]) 08/07/2021   COPD (chronic obstructive pulmonary disease) ([name])    HLD (hyperlipidemia)    Iron deficiency anemia    Chronic kidney disease, stage 3a ([place]) 07/22/2021   Acute on chronic respiratory failure with hypoxia and hypercapnia ([name]) 07/20/2021   Chronic obstructive pulmonary disease ([name])    Acute CHF (congestive heart failure) ([place]) 04/04/2021   Weakness    Acute respiratory failure with hypoxia and hypercapnia ([name]) 03/11/2021   (HFpEF) heart failure with preserved ejection fraction  ([name])    Chronic respiratory failure with hypoxia ([name])    Hyperlipidemia 02/19/2021   Acute respiratory distress syndrome (ARDS) due to COVID-19 virus ([place])    Acute on chronic respiratory failure ([place]) 02/18/2021   Generalized osteoarthritis of multiple sites 10/31/2020   AKI (acute kidney injury) ([place]) 03/04/2020   Hyperkalemia 03/04/2020   COPD with acute exacerbation ([place]) 03/02/2020   Marijuana use 01/17/2020   Thrombocytopenia ([place]) 01/17/2020   Positive hepatitis C antibody test 01/17/2020   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 mass index (BMI) of 50.0 to 59.9 in adult [facility]) 01/17/2020   Osteoarthritis of glenohumeral joints (Bilateral) 01/17/2020   Osteoarthritis of AC (acromioclavicular) joints (Bilateral) 01/17/2020   Polysubstance abuse ([place]) 94/70/9628   Toxic metabolic encephalopathy 36/62/9476   Chronic pain syndrome 07/14/2019   DDD (degenerative disc disease), lumbosacral 07/14/2019   DDD (degenerative disc disease), cervical 07/14/2019   CHF (congestive heart failure) ([place]) 04/12/2017   Acute on chronic diastolic CHF (congestive heart failure) ([place]) 02/28/2015   Obstructive sleep apnea 02/28/2015   Acute on chronic diastolic congestive heart failure ([name])    Acute on chronic respiratory failure with hypoxia ([place])    Morbid obesity with BMI of 50.0-59.9, adult ([place]) 04/01/2014   HTN (hypertension) 04/01/2014   PCP:  Center, [place]:   [place], [place] [address] Phone: [phone] Fax: [phone]     Social Determinants of Health (SDOH) Social History: SDOH Screenings   Food Insecurity: No Food Insecurity (04/15/2022)  Housing: Low Risk  (04/15/2022)  Transportation Needs: Unmet Transportation Needs (04/15/2022)  Utilities: Not At Risk (04/15/2022)  Recent Concern: Utilities - At Risk (02/05/2022)  Depression (PHQ2-9): Low Risk  (06/07/2021)   Financial Resource Strain: Medium Risk (05/03/2017)  Physical Activity: Insufficiently Active (05/03/2017)  Social Connections: Moderately Integrated (05/03/2017)  Stress: No Stress Concern Present (05/03/2017)  Tobacco Use: Medium Risk (04/26/2022)   SDOH Interventions:     Readmission Risk Interventions    12/31/2021    8:58 AM 12/20/2021    4:21 PM 05/04/2021    1:26 PM  Readmission Risk Prevention Plan  Transportation Screening Complete Complete Complete  Medication Review Press photographer) Complete Complete Complete  PCP or Specialist appointment within 3-5 days of discharge Complete Complete Complete  HRI or Home Care Consult Complete Complete Complete  SW Recovery Care/Counseling Consult Complete Complete Complete  Palliative Care Screening Complete Complete Not [place] Not Applicable Not Applicable Not Applicable

## 2022-04-26 NOTE — ED Notes (Signed)
External purewick placed on pt per pts request. Pt complaining about stretcher bed hurting his feet. Warm blankets were placed under pts feet and positioned for comfort. Pt repositioned and sat up in bed, per request. This tech informed pt to press call bell in case he needed assistance, and to not yell. Call bell placed in reach. Pt denied needing anything else at this time.

## 2022-04-26 NOTE — ED Notes (Signed)
Patient repositioned in bed.  Social work at bedside.

## 2022-04-26 NOTE — ED Notes (Addendum)
Pt provided with pericare, complete linen change and repositioning by this and 3 other RNs. Pt warm dry and comfortable at this time.  Urine container emptied. Denies further needs.

## 2022-04-26 NOTE — ED Notes (Signed)
Dellia Nims from [facility] came by to speak with patient.  She relayed that the patient is in the process of applying for Medicare to help facilitate placement.  She spoke with hospital case management while here as well.

## 2022-04-26 NOTE — ED Notes (Signed)
Patient repositioned in bed as requested. Bed lock and lowered.

## 2022-04-27 NOTE — Progress Notes (Signed)
RT to patient bedside for trach care. All trach care done at this time. Patient was able to perform all trach care independently with minimal assistance for suctioning. Patient on trach collar 5L 28%, tolerating well. Patient states he does not want to wear his home vent/trilogy tonight, stating it is dead and needs to be charged. This RT offered to assist with setting up and charging home vent but patient declined stating he will do it tomorrow, does not want to wear it tonight. Additional trach supplies left at bedside for patient.

## 2022-04-27 NOTE — ED Notes (Signed)
Pt finished lunch. Pt complaining of wanting a bigger bed. Anda Kraft, RN already requested another bed for this pt previously today waiting to find out if bariatric bed is available.

## 2022-04-27 NOTE — ED Notes (Signed)
Pt given ginger ale per his request.

## 2022-04-27 NOTE — NC FL2 (Signed)
[place] LEVEL OF CARE FORM     IDENTIFICATION  Patient Name: Gary [name] Birthdate: 07-Oct-1955 Sex: male Admission Date (Current Location): 04/26/2022  [facility] and [place] Number:  Engineering geologist and Address:  [facility], [address] [place], [place] [address]      Provider Number: [id number]  Attending Physician Name and Address:  No att. providers found  Relative Name and Phone Number:  [name] - sister- [phone]    Current Level of Care:  (ED boarder for placement - rehab) Recommended Level of Care: [place] Prior Approval Number:    Date Approved/Denied:   PASRR Number: [id number]  Discharge Plan: SNF    Current Diagnoses: Patient Active Problem List   Diagnosis Date Noted   Acute exacerbation of CHF (congestive heart failure) ([place]) 04/11/2022   Influenza A 04/11/2022   Hypokalemia 03/06/2022   Acute on chronic diastolic (congestive) heart failure ([place]) 02/03/2022   Chronic obstructive pulmonary disease (COPD) ([place]) 02/03/2022   GERD without esophagitis 02/03/2022   Anxiety and depression 02/03/2022   Dyslipidemia 02/03/2022   Bloody stool 11/12/2021   Major depressive disorder, recurrent episode, moderate ([place]) 10/22/2021   Pressure injury of skin 09/27/2021   Acute on chronic respiratory failure with hypercapnia ([place]) 08/07/2021   COPD (chronic obstructive pulmonary disease) ([place])    HLD (hyperlipidemia)    Iron deficiency anemia    Chronic kidney disease, stage 3a ([place]) 07/22/2021   Acute on chronic respiratory failure with hypoxia and hypercapnia ([place]) 07/20/2021   Chronic obstructive pulmonary disease ([place])    Acute CHF (congestive heart failure) ([place]) 04/04/2021   Weakness    Acute respiratory failure with hypoxia and hypercapnia ([place]) 03/11/2021   (HFpEF) heart failure with preserved ejection fraction ([name])    Chronic respiratory failure with hypoxia ([place])     Hyperlipidemia 02/19/2021   Acute respiratory distress syndrome (ARDS) due to COVID-19 virus ([place])    Acute on chronic respiratory failure ([place]) 02/18/2021   Generalized osteoarthritis of multiple sites 10/31/2020   AKI (acute kidney injury) ([place]) 03/04/2020   Hyperkalemia 03/04/2020   COPD with acute exacerbation ([place]) 03/02/2020   Marijuana use 01/17/2020   Thrombocytopenia ([place]) 01/17/2020   Positive hepatitis C antibody test 01/17/2020   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 mass index (BMI) of 50.0 to 59.9 in adult [facility]) 01/17/2020   Osteoarthritis of glenohumeral joints (Bilateral) 01/17/2020   Osteoarthritis of AC (acromioclavicular) joints (Bilateral) 01/17/2020   Polysubstance abuse ([place]) 16/10/3708   Toxic metabolic encephalopathy 62/69/4854   Chronic pain syndrome 07/14/2019   DDD (degenerative disc disease), lumbosacral 07/14/2019   DDD (degenerative disc disease), cervical 07/14/2019   CHF (congestive heart failure) ([place]) 04/12/2017   Acute on chronic diastolic CHF (congestive heart failure) ([place]) 02/28/2015   Obstructive sleep apnea 02/28/2015   Acute on chronic diastolic congestive heart failure ([name])    Acute on chronic respiratory failure with hypoxia ([place])    Morbid obesity with BMI of 50.0-59.9, adult ([place]) 04/01/2014   HTN (hypertension) 04/01/2014    Orientation RESPIRATION BLADDER Height & Weight     Self, Time, Situation, Place  Tracheostomy (Trilogy/ Bipap at night) Continent Weight:   Height:     BEHAVIORAL SYMPTOMS/MOOD NEUROLOGICAL BOWEL NUTRITION STATUS      Continent Diet (Regular)  AMBULATORY STATUS COMMUNICATION OF NEEDS Skin   Extensive Assist Verbally Normal  Personal Care Assistance Level of Assistance  Bathing, Feeding, Dressing Bathing Assistance: Limited assistance Feeding assistance: Independent Dressing Assistance: Limited assistance     Functional Limitations Info    Sight Info:  Adequate Hearing Info: Adequate Speech Info: Adequate    SPECIAL CARE FACTORS FREQUENCY  PT (By licensed PT), OT (By licensed OT)     PT Frequency: 5 times per week OT Frequency: 5 times per week            Contractures Contractures Info: Not present    Additional Factors Info  Code Status, Allergies Code Status Info: Full Allergies Info: NKA           Current Medications (04/27/2022):  This is the current hospital active medication list Current Facility-Administered Medications  Medication Dose Route Frequency Provider Last Rate Last Admin   escitalopram (LEXAPRO) tablet 5 mg  5 mg Oral Daily Blake Divine, MD   5 mg at 04/27/22 0845   ferrous sulfate tablet 325 mg  325 mg Oral Q breakfast Blake Divine, MD   325 mg at 04/27/22 0845   heparin injection 5,000 Units  5,000 Units Subcutaneous Q8H Wynelle Cleveland, RPH   5,000 Units at 04/27/22 1416   hydrOXYzine (ATARAX) tablet 25 mg  25 mg Oral TID PRN Blake Divine, MD       ipratropium-albuterol (DUONEB) 0.5-2.5 (3) MG/3ML nebulizer solution 3 mL  3 mL Inhalation Q6H PRN Blake Divine, MD       melatonin tablet 5 mg  5 mg Oral QHS Blake Divine, MD   5 mg at 04/26/22 2106   pantoprazole (PROTONIX) EC tablet 40 mg  40 mg Oral BID Doylene Bode, MD   40 mg at 04/27/22 0845   torsemide (DEMADEX) tablet 20 mg  20 mg Oral Daily Blake Divine, MD   20 mg at 04/27/22 9233   Current Outpatient Medications  Medication Sig Dispense Refill   apixaban (ELIQUIS) 2.5 MG TABS tablet Take 1 tablet (2.5 mg total) by mouth 2 (two) times daily. 60 tablet 0   bisacodyl (DULCOLAX) 10 MG suppository Place 1 suppository (10 mg total) rectally daily as needed for moderate constipation. 12 suppository 0   escitalopram (LEXAPRO) 5 MG tablet Take 1 tablet (5 mg total) by mouth daily. 30 tablet 0   ferrous sulfate 325 (65 FE) MG tablet Take 1 tablet (325 mg total) by mouth daily with breakfast. Home med 60 tablet 1   hydrOXYzine  (ATARAX) 25 MG tablet Take 1 tablet (25 mg total) by mouth 3 (three) times daily as needed for anxiety. 30 tablet 0   ipratropium-albuterol (DUONEB) 0.5-2.5 (3) MG/3ML SOLN Take 3 mLs by nebulization every 6 (six) hours as needed.     melatonin 5 MG TABS Take 1 tablet (5 mg total) by mouth at bedtime. 30 tablet 0   pantoprazole (PROTONIX) 40 MG tablet Take 1 tablet (40 mg total) by mouth 2 (two) times daily before a meal. 60 tablet 0   polyethylene glycol (MIRALAX / GLYCOLAX) 17 g packet Take 17 g by mouth daily as needed for moderate constipation. 14 each 0   torsemide (DEMADEX) 20 MG tablet Take 1 tablet (20 mg total) by mouth daily. 30 tablet 2   COMBIVENT RESPIMAT 20-100 MCG/ACT AERS respimat Inhale 2 puffs into the lungs in the morning and at bedtime. (Patient not taking: Reported on 01/24/2022)     simvastatin (ZOCOR) 10 MG tablet Take 1 tablet (10 mg total) by mouth daily at 6  PM. (Patient not taking: Reported on 01/24/2022) 30 tablet 1     Discharge Medications: Please see discharge summary for a list of discharge medications.  Relevant Imaging Results:  Relevant Lab Results:   Additional Information SSN [id number]  Shelbie Hutching, RN

## 2022-04-27 NOTE — ED Notes (Signed)
Pt assisted with trach suctioning due to mucous build up.  Pt expressed relief and denies any other needs at this time.

## 2022-04-27 NOTE — ED Notes (Signed)
Pt A&Ox4. Pt relaxing watching television.

## 2022-04-27 NOTE — ED Provider Notes (Signed)
-----------------------------------------   5:55 AM on 04/27/2022 -----------------------------------------   Blood pressure 111/89, pulse (!) 104, temperature 98 F (36.7 C), resp. rate (!) 21, SpO2 98 %.  The patient is calm and cooperative at this time.  There have been no acute events since the last update.  Awaiting disposition plan from Social Work team(s).   Paulette Blanch, MD 04/27/22 [phone]

## 2022-04-27 NOTE — ED Notes (Signed)
Patient requested a [place] sandwhich and a cola. Spoke with nurse and was advised ok to give. Provided sandwhich tray and a cola. Patient ok.

## 2022-04-27 NOTE — ED Notes (Signed)
Pt repositioned per his request.

## 2022-04-27 NOTE — TOC Progression Note (Signed)
Transition of Care [facility]) - Progression Note    Patient Details  Name: Gary [name] MRN: [id number] Date of Birth: 03-09-1956  Transition of Care [facility]) CM/SW Contact  Shelbie Hutching, RN Phone Number: 04/27/2022, 2:38 PM  Clinical Narrative:    RNCM started bed search for SNF.  Called and left a message for admissions at [facility] in [place].  Also attempted to call [facility], unable to get in touch with admissions, phone just rang and rang no option to leave a message.   Will fax referral to [facility].  Also sent out referrals to all SNF's in [place].    Expected Discharge Plan: [place] Barriers to Discharge: ED Unsafe disposition  Expected Discharge Plan and Services   Discharge Planning Services: CM Consult Post Acute Care Choice: [place] Living arrangements for the past 2 months: Single Family Home                 DME Arranged: N/A DME Agency: NA                   Social Determinants of Health (SDOH) Interventions SDOH Screenings   Food Insecurity: No Food Insecurity (04/15/2022)  Housing: Low Risk  (04/15/2022)  Transportation Needs: Unmet Transportation Needs (04/15/2022)  Utilities: Not At Risk (04/15/2022)  Recent Concern: Utilities - At Risk (02/05/2022)  Depression (PHQ2-9): Low Risk  (06/07/2021)  Financial Resource Strain: Medium Risk (05/03/2017)  Physical Activity: Insufficiently Active (05/03/2017)  Social Connections: Moderately Integrated (05/03/2017)  Stress: No Stress Concern Present (05/03/2017)  Tobacco Use: Medium Risk (04/26/2022)    Readmission Risk Interventions    12/31/2021    8:58 AM 12/20/2021    4:21 PM 05/04/2021    1:26 PM  Readmission Risk Prevention Plan  Transportation Screening Complete Complete Complete  Medication Review Press photographer) Complete Complete Complete  PCP or Specialist appointment within 3-5 days of discharge Complete Complete Complete  HRI or Home Care Consult Complete  Complete Complete  SW Recovery Care/Counseling Consult Complete Complete Complete  Palliative Care Screening Complete Complete Not [place] Not Applicable Not Applicable Not Applicable

## 2022-04-27 NOTE — Evaluation (Signed)
Physical Therapy Evaluation Patient Details Name: Gary [name] MRN: [id number] DOB: 1956/02/17 Today's Date: 04/27/2022  History of Present Illness  Contrell Ballentine is a 66yoM who comes to [facility] on 04/26/21 reports his self care needs not being met after prior DC, staffing issues in home and reportedly not able to get his medications. Pt was DC from here 10 days prior after admission for respiratory distress, found to have active influenza infection, also CHF exacerbation. APS now involved in pt's case and he is in process of applying for medicare and obtaining facility placement. 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 At baseline, pt is lift transfers and total care +2, nonambulatory despite extensive time working with rehab in acute setting over several months with goals of maximizing independence in bed mobility and transfers.  Clinical Impression  Pt admitted with above Dx. Pt has functional limitations due to deficits below (see "PT Problem List"). Pt able to provide updated details on DME setup at home. Per chart, 7d/week care was not established as planned. Today pt requires considerable physical assistance to perform bed mobility. Pt participates in exam of A/ROM and strength, has mild to moderate ROM limitations, severe strength impairment in legs>BUE. Patient's performance this date reveals decreased ability, independence, and tolerance in performing all basic mobility required for performance of activities of daily living. Pt's most recent baseline include hoyer transfers OOB, modA for returning to supine from EOB. Over the past could months he has been able to partake in a handful of stand pivot transfers, however not to the degree where this has become a safe strategy for home performance or even nursing staff here. For >3 months his standing tolerance has been limited to <10 minutes/week. If adequate care cannot be  established as planned in the home setting, LTC facility may be more appropriate as pt cannot perform his own self care needs. Pt requires additional DME, close physical assistance, and cues for safe participate in mobility. As patient's mobility needs are complex, pt will benefit from skilled PT intervention while admitted to prevent any further decline in strength, mobility, independence, as well as to update any changes in physical therapy prognosis.      Recommendations for follow up therapy are one component of a multi-disciplinary discharge planning process, led by the attending physician.  Recommendations may be updated based on patient status, additional functional criteria and insurance authorization.  Follow Up Recommendations Long-term institutional care without follow-up therapy (could receive HHPT at LTC once established.) Can patient physically be transported by private vehicle: No    Assistance Recommended at Discharge Intermittent Supervision/Assistance  Patient can return home with the following  Two people to help with bathing/dressing/bathroom;Assistance with cooking/housework;Assist for transportation;Help with stairs or ramp for entrance;Two people to help with walking and/or transfers;Direct supervision/assist for medications management;Direct supervision/assist for financial management    Equipment Recommendations None recommended by PT  Recommendations for Other Services       Functional Status Assessment Patient has not had a recent decline in their functional status     Precautions / Restrictions Precautions Precautions: Fall Precaution Comments: cuffed trach/ trach collar Restrictions Weight Bearing Restrictions: No      Mobility  Bed Mobility Overal bed mobility: Needs Assistance Bed Mobility: Rolling Rolling: +2 for physical assistance, Max assist         General bed mobility comments: +3 lateral slide for bed to bed transfer  Transfers                         Ambulation/Gait                  Stairs            Wheelchair Mobility    Modified Rankin (Stroke Patients Only)       Balance                                             Pertinent Vitals/Pain Pain Assessment Pain Assessment: No/denies pain    Home Living Family/patient expects to be discharged to:: Private residence Living Arrangements: Other relatives Available Help at Discharge: Family;Personal care attendant Type of Home: Apartment Home Access: Level entry;Ramped entrance       Home Layout: One level Home Equipment: Cane - single point;Wheelchair - IT trainer (2 wheels);Other (comment);Hospital bed (hoyer lift)      Prior Function Prior Level of Function : Needs assist             Mobility Comments: hoyer lift transfers >5 months, non AMB ADLs Comments: needing assist for all mobility     Hand Dominance        Extremity/Trunk Assessment                Communication      Cognition Arousal/Alertness: Awake/alert Behavior During Therapy: WFL for tasks assessed/performed Overall Cognitive Status: Within Functional Limits for tasks assessed                                          General Comments      Exercises     Assessment/Plan    PT Assessment Patient needs continued PT services  PT Problem List Decreased strength;Decreased mobility;Decreased activity tolerance;Cardiopulmonary status limiting activity;Decreased balance;Decreased skin integrity;Obesity       PT Treatment Interventions Functional mobility training;Neuromuscular re-education;Balance training;Therapeutic exercise;Therapeutic activities;Patient/family education;Wheelchair mobility training;DME instruction    PT Goals (Current goals can be found in the Care Plan section)  Acute Rehab PT Goals Patient Stated Goal: to go to rehab and return to walking PT Goal Formulation: With  patient Time For Goal Achievement: 05/11/22 Potential to Achieve Goals: Poor    Frequency Min 2X/week     Co-evaluation               AM-PAC PT "6 Clicks" Mobility  Outcome Measure Help needed turning from your back to your side while in a flat bed without using bedrails?: Total Help needed moving from lying on your back to sitting on the side of a flat bed without using bedrails?: Total Help needed moving to and from a bed to a chair (including a wheelchair)?: Total Help needed standing up from a chair using your arms (e.g., wheelchair or bedside chair)?: Total Help needed to walk in hospital room?: Total Help needed climbing 3-5 steps with a railing? : Total 6 Click Score: 6    End of Session Equipment Utilized During Treatment: Oxygen Activity Tolerance: Patient tolerated treatment well;No increased pain Patient left: in bed;with call bell/phone within reach Nurse Communication: Mobility status PT Visit Diagnosis: Unsteadiness on feet (R26.81);Muscle weakness (generalized) (M62.81);Difficulty in walking, not elsewhere classified (R26.2)  Time: 9597-4718 PT Time Calculation (min) (ACUTE ONLY): 33 min   Charges:   PT Evaluation $PT Eval Low Complexity: 1 Low PT Treatments $Therapeutic Activity: 8-22 mins       2:17 PM, 04/27/22 Etta Grandchild, PT, DPT Physical Therapist - [facility]  [phone] ([place])    Leitha Hyppolite C 04/27/2022, 2:08 PM

## 2022-04-27 NOTE — ED Notes (Signed)
Pt incontinent of urine from [place] becoming unattached in some places. Pt cleaned up and moved from stretcher to regular bed. New purewick placed on pt. PT is now with patient doing an evaluation.

## 2022-04-27 NOTE — ED Notes (Signed)
Notes reviewed from patient's ED visit - Patient is currently awaiting placement and needs PT/OT evaluation for appropriate placement options. Notes from PT show that patient refused PT evaluation. Spoke with ED MD and if patient continues to refuse PT evaluation, he will be discharged. Patient sleeping at this time, will inform patient of this when awake.

## 2022-04-27 NOTE — ED Notes (Signed)
Patient called to request bedpan and purewick to be changed because he was wet. Applied bedpan and waited at the door until finished. Patient had a large BM. Cleaned patient, applied clean new chucks. Removed purewick, cleaned with wipes. Dried all wet areas. Applied new purewick. Pulled patient up in bed, applied blankets over patient. Call bell, bedside table and suctioner in reach. Patient stated "we did a good job and thanks". Patient ok and no other services needed.

## 2022-04-28 NOTE — Progress Notes (Signed)
RT to patient bedside for trach care assistance. Patient is able to change inner cannula, suction trach and needs minimal assistance changing trach gauze. Additional trach supplies at bedside and within reach. Patient is on trach collar 5L 28% tolerating well, will remain on TC throughout the night due to home vent not working properly. Patient declines further assistance as this time.

## 2022-04-28 NOTE — ED Provider Notes (Signed)
-----------------------------------------   6:26 AM on 04/28/2022 -----------------------------------------   Blood pressure 130/78, pulse 85, temperature 98.1 F (36.7 C), temperature source Oral, resp. rate 20, SpO2 95 %.  The patient is calm and cooperative at this time.  There have been no acute events since the last update.  Awaiting disposition plan from case management/social work.    Abbie Jablon, Delice Bison, DO 04/28/22 [phone]

## 2022-04-28 NOTE — Evaluation (Signed)
Occupational Therapy Evaluation Patient Details Name: Gary [name] MRN: [id number] DOB: 10/23/1955 Today's Date: 04/28/2022   History of Present Illness Gary [name] is a 67yoM who comes to [facility] on 04/26/21 reports his self care needs not being met after prior DC, staffing issues in home and reportedly not able to get his medications. Pt was DC from here 10 days prior after admission for respiratory distress, found to have active influenza infection, also CHF exacerbation. APS now involved in pt's case and he is in process of applying for medicare and obtaining facility placement. 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 At baseline, pt is lift transfers and total care +2, nonambulatory despite extensive time working with rehab in acute setting over several months with goals of maximizing independence in bed mobility and transfers.   Clinical Impression   Upon entering the room, pt is supine in bed requiring encouragement for participation. Pt reporting, "It is too early". Pt is pleasant overall and is known to this therapist. Recently he has been home alone with intermittent assist from family and [facility] aide that was supposed to come 7x/wk but was not always able to do that leaving pt with no assistance. Pt declined EOB and OOB activities this session and states, " I am going to be ready to do that on Monday". Pt does demonstrate ankle pumps he has been doing in bed and OT recommending glute squeezes as well. He needed heavy max A with use of bed rails to roll with staff and reposition LEs. He remains motivated for therapeutic intervention.      Recommendations for follow up therapy are one component of a multi-disciplinary discharge planning process, led by the attending physician.  Recommendations may be updated based on patient status, additional functional criteria and insurance authorization.   Follow Up Recommendations   Long-term institutional care without follow-up therapy     Assistance Recommended at Discharge Frequent or constant Supervision/Assistance  Patient can return home with the following Two people to help with walking and/or transfers;Two people to help with bathing/dressing/bathroom;Assistance with cooking/housework    Functional Status Assessment  Patient has had a recent decline in their functional status and demonstrates the ability to make significant improvements in function in a reasonable and predictable amount of time.  Equipment Recommendations  None recommended by OT       Precautions / Restrictions Precautions Precautions: Fall Precaution Comments: cuffed trach/ trach collar      Mobility Bed Mobility Overal bed mobility: Needs Assistance Bed Mobility: Rolling Rolling: Total assist              Transfers                   General transfer comment: Pt refuses attempts and wants to get started with this "on monday"          ADL either performed or assessed with clinical judgement   ADL Overall ADL's : Needs assistance/impaired Eating/Feeding: Modified independent   Grooming: Wash/dry hands;Wash/dry face;Set up;Supervision/safety                                 General ADL Comments: Pt requries heavy assistance for bed mobility and unwill to attempt EOB or OOB activities this session     Vision Patient Visual Report: No change from baseline  Pertinent Vitals/Pain Pain Assessment Pain Assessment: No/denies pain     Hand Dominance Right   Extremity/Trunk Assessment Upper Extremity Assessment Upper Extremity Assessment: Generalized weakness   Lower Extremity Assessment Lower Extremity Assessment: Generalized weakness       Communication Communication Communication: Tracheostomy;Passy-Muir valve   Cognition Arousal/Alertness: Awake/alert Behavior During Therapy: WFL for tasks assessed/performed Overall  Cognitive Status: Within Functional Limits for tasks assessed                                                  Home Living Family/patient expects to be discharged to:: Private residence Living Arrangements: Other relatives Available Help at Discharge: Family;Personal care attendant Type of Home: Apartment Home Access: Level entry;Ramped entrance     Home Layout: One level     Bathroom Shower/Tub: Teacher, early years/pre: Handicapped height     Home Equipment: Cane - single point;Wheelchair - IT trainer (2 wheels);Other (comment);Hospital bed   Additional Comments: hoyer lift      Prior Functioning/Environment Prior Level of Function : Needs assist             Mobility Comments: hoyer lift transfers >5 months, non AMB ADLs Comments: needing assist for self care        OT Problem List: Decreased strength;Decreased activity tolerance;Decreased range of motion;Impaired balance (sitting and/or standing);Decreased safety awareness      OT Treatment/Interventions: Self-care/ADL training;Therapeutic exercise;Energy conservation;DME and/or AE instruction;Therapeutic activities;Patient/family education;Balance training    OT Goals(Current goals can be found in the care plan section) Acute Rehab OT Goals Patient Stated Goal: "to walk again" OT Goal Formulation: With patient Time For Goal Achievement: 04/17/22 Potential to Achieve Goals: Fair ADL Goals Pt Will Perform Grooming: with modified independence;sitting Pt Will Perform Lower Body Dressing: with min assist;sitting/lateral leans Pt Will Transfer to Toilet: with min assist;ambulating;bedside commode;stand pivot transfer Pt/caregiver will Perform Home Exercise Program: Increased strength;Both right and left upper extremity;With theraband;With written HEP provided;Independently  OT Frequency: Min 2X/week       AM-PAC OT "6 Clicks" Daily Activity     Outcome Measure  Help from another person eating meals?: None Help from another person taking care of personal grooming?: A Little Help from another person toileting, which includes using toliet, bedpan, or urinal?: Total Help from another person bathing (including washing, rinsing, drying)?: Total Help from another person to put on and taking off regular upper body clothing?: A Little Help from another person to put on and taking off regular lower body clothing?: Total 6 Click Score: 13   End of Session Nurse Communication: Mobility status  Activity Tolerance: Patient limited by fatigue Patient left: in bed;with bed alarm set;with call bell/phone within reach  OT Visit Diagnosis: Other abnormalities of gait and mobility (R26.89);Muscle weakness (generalized) (M62.81)                Time: 8099-8338 OT Time Calculation (min): 14 min Charges:  OT General Charges $OT Visit: 1 Visit OT Evaluation $OT Eval Moderate Complexity: 1 [address] [place], OTR/L , CBIS ascom [phone]  04/28/22, 11:48 AM

## 2022-04-28 NOTE — ED Provider Notes (Signed)
Emergency Medicine Observation Re-evaluation Note  Gary [name] is a 67 y.o. male, currently awaiting placement to living facility by social work.  Physical Exam  BP 133/80 (BP Location: Right Arm)   Pulse 80   Temp 98.3 F (36.8 C) (Oral)   Resp 18   SpO2 95%    ED Course / MDM   Lab work does not show any concerning findings, reassuring CBC, reassuring chemistry, normal hepatic function panel, normal lipase, negative troponin and a reassuring chest x-ray.  Plan  Current plan is for placement to an appropriate facility by social work.    Harvest Dark, MD 04/28/22 2233

## 2022-04-28 NOTE — Progress Notes (Signed)
RT note:  Gary [name] complained that his home vent would not charge and was dead.  Verified that his Astral was indeed dead, and upon further investigation, it was noted that the power cord was damaged at the connection.  Discussed with TOC, who will reach out to [employer] for assistance.

## 2022-04-28 NOTE — ED Notes (Incomplete)
Patient called to use the bedpan. Assisted patient. Patient grabbed rails and rolled over as I pulled on tuck. BM all on tuck. Patient had a small loose BM. Cleaned patient with wipes. Placed new tucks underneath. Patient requested to be pulled up. Placed patient in

## 2022-04-28 NOTE — Progress Notes (Signed)
CSW contacted Gary [name] with [employer] who stated that they will reach out to patient about his Astral 150 with the connector no longer working.

## 2022-04-29 NOTE — ED Notes (Signed)
Pt placed on bed pan, cleaned of stool and new chucks placed.

## 2022-04-29 NOTE — ED Notes (Signed)
Patient had large bowel movement. Assisted patient off of bed pan and cleaned skin. Clean chucks pad placed below patient. Patient repositioned. Pt states "thank you."

## 2022-04-29 NOTE — ED Notes (Signed)
Pt called out to cut guaze into split dressing for trach.

## 2022-04-29 NOTE — ED Notes (Signed)
Pt suction container full and backed up onto pt. Pt cleaned of urine and stool, gown and linens changed. New external male purewick applied.

## 2022-04-29 NOTE — ED Notes (Signed)
Pt called out to use bedpan. Pt voided, cleaned, new chucks placed.

## 2022-04-30 MED ORDER — GUAIFENESIN ER 600 MG PO TB12
600.0000 mg | ORAL_TABLET | Freq: Two times a day (BID) | ORAL | Status: DC | PRN
  Administered 2022-04-30 – 2022-07-10 (×95): 600 mg via ORAL
  Filled 2022-04-30 (×100): qty 1

## 2022-04-30 NOTE — ED Notes (Signed)
Called to patient's room with request of wanting left leg elevated on pillow. Pillow placed under left lower extremity.

## 2022-04-30 NOTE — ED Provider Notes (Signed)
-----------------------------------------   5:20 AM on 04/30/2022 -----------------------------------------   Blood pressure 104/72, pulse 93, temperature 98.2 F (36.8 C), temperature source Oral, resp. rate 18, SpO2 (!) 88 %.  The patient is calm and cooperative at this time.  There have been no acute events since the last update.  Awaiting disposition plan from Social Work team.   Paulette Blanch, MD 04/30/22 [phone]

## 2022-04-30 NOTE — ED Notes (Signed)
Patient is resting comfortably. 

## 2022-04-30 NOTE — ED Notes (Signed)
Pt provided crackers, peanut butter and cola x2

## 2022-04-30 NOTE — ED Notes (Signed)
Patient called stating that he hasn't had a bath or brushed his teeth since he has been here. Advised I will gather supplies and give him a bed bath. Was assisted by Nts Levonne Spiller & Tilden Dome. Provided full bed bath. Change linens (sheets, gown and pillowcases). Pulled patient up in the bed. Covered patient with clean warm blankets. Asked if he needed anything else, patient stated "Thank you" and " I feel a lot better". Provided toothpaste and toothbrush. Patient declined and stated that he will brush his teeth in the morning. Patient ok.

## 2022-04-30 NOTE — ED Notes (Signed)
Pt given [place] sandwich tray and water per request.

## 2022-04-30 NOTE — TOC Progression Note (Addendum)
Transition of Care [facility]) - Progression Note    Patient Details  Name: Gary [name] MRN: [id number] Date of Birth: [birth year]  [facility]) CM/SW Contact  Shelbie Hutching, RN Phone Number: 04/30/2022, 3:42 PM  Clinical Narrative:    RNCM was able to speak with Santiago Glad in Admissions over at [facility]- [phone].  She says that they will look at the referral.  Initially she thought that the home vent was non invasive but explained that because he has a trach it is considered invasive.   Faxed referral to [phone].    Expected Discharge Plan: [place] Barriers to Discharge: ED Unsafe disposition  Expected Discharge Plan and Services   Discharge Planning Services: CM Consult Post Acute Care Choice: [place] Living arrangements for the past 2 months: Single Family Home                 DME Arranged: N/A DME Agency: NA                   Social Determinants of Health (SDOH) Interventions SDOH Screenings   Food Insecurity: No Food Insecurity (04/15/2022)  Housing: Low Risk  (04/15/2022)  Transportation Needs: Unmet Transportation Needs (04/15/2022)  Utilities: Not At Risk (04/15/2022)  Recent Concern: Utilities - At Risk (02/05/2022)  Depression (PHQ2-9): Low Risk  (06/07/2021)  Financial Resource Strain: Medium Risk (05/03/2017)  Physical Activity: Insufficiently Active (05/03/2017)  Social Connections: Moderately Integrated (05/03/2017)  Stress: No Stress Concern Present (05/03/2017)  Tobacco Use: Medium Risk (04/26/2022)    Readmission Risk Interventions    12/31/2021    8:58 AM 12/20/2021    4:21 PM 05/04/2021    1:26 PM  Readmission Risk Prevention Plan  Transportation Screening Complete Complete Complete  Medication Review Press photographer) Complete Complete Complete  PCP or Specialist appointment within 3-5 days of discharge Complete Complete Complete  HRI or Home Care Consult Complete Complete Complete  SW Recovery  Care/Counseling Consult Complete Complete Complete  Palliative Care Screening Complete Complete Not [place] Not Applicable Not Applicable Not Applicable

## 2022-04-30 NOTE — Progress Notes (Signed)
Physical Therapy Treatment Patient Details Name: Gary [name] MRN: [id number] DOB: May 24, 1955 Today's Date: 04/30/2022   History of Present Illness Gary [name] is a 65yoM who comes to [facility] on 04/26/21 reports his self care needs not being met after prior DC, staffing issues in home and reportedly not able to get his medications. Pt was DC from here 10 days prior after admission for respiratory distress, found to have active influenza infection, also CHF exacerbation. APS now involved in pt's case and he is in process of applying for medicare and obtaining facility placement. 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 At baseline, pt is lift transfers and total care +2, nonambulatory despite extensive time working with rehab in acute setting over several months with goals of maximizing independence in bed mobility and transfers.    PT Comments    Patient is agreeable to PT and cooperative throughout session. Max A required for bed mobility. The patient sat on the side of the bed for ~ 8 minutes with no dizziness or pain reported. No external support from therapist required to maintain sitting balance, however patient does prop up on the right arm/elbow intermittently for support due to fatigue and generalized weakness. He wants to try standing during the next session. Recommend to continue PT to maximize independence and decrease caregiver burden.     Recommendations for follow up therapy are one component of a multi-disciplinary discharge planning process, led by the attending physician.  Recommendations may be updated based on patient status, additional functional criteria and insurance authorization.  Follow Up Recommendations  Long-term institutional care (with initial therapy follow-up) Can patient physically be transported by private vehicle: No   Assistance Recommended at Discharge Intermittent Supervision/Assistance   Patient can return home with the following Two people to help with bathing/dressing/bathroom;Assistance with cooking/housework;Assist for transportation;Help with stairs or ramp for entrance;Two people to help with walking and/or transfers;Direct supervision/assist for medications management;Direct supervision/assist for financial management   Equipment Recommendations  None recommended by PT    Recommendations for Other Services       Precautions / Restrictions Precautions Precautions: Fall Restrictions Weight Bearing Restrictions: No     Mobility  Bed Mobility Overal bed mobility: Needs Assistance       Supine to sit: Max assist Sit to supine: Max assist   General bed mobility comments: assistance for LE and trunk support. verbal cues for technique with increased time and effort required with functional mobility tasks    Transfers                   General transfer comment: not attempted today. patient is willing to try standing tomorrow with a second person and the bariatric rolling walker    Ambulation/Gait                   Stairs             Wheelchair Mobility    Modified Rankin (Stroke Patients Only)       Balance Overall balance assessment: Needs assistance Sitting-balance support: Feet supported Sitting balance-[name] Scale: Fair Sitting balance - Comments: patient does prop on the right elbow intermittently for support, likely related to generalized weakness and fatigue with minimal activity. he can maintain midline for brief periods without UE support  Cognition Arousal/Alertness: Awake/alert Behavior During Therapy: WFL for tasks assessed/performed Overall Cognitive Status: Within Functional Limits for tasks assessed                                          Exercises      General Comments General comments (skin integrity, edema, etc.): total sitting time  ~ 8 minutes. no dizziness or pain is reported with upright sitting activity      Pertinent Vitals/Pain Pain Assessment Pain Assessment: No/denies pain    Home Living                          Prior Function            PT Goals (current goals can now be found in the care plan section) Acute Rehab PT Goals Patient Stated Goal: to get to rehab PT Goal Formulation: With patient Time For Goal Achievement: 05/11/22 Potential to Achieve Goals: Poor Progress towards PT goals: Progressing toward goals    Frequency    Min 2X/week      PT Plan Current plan remains appropriate    Co-evaluation              AM-PAC PT "6 Clicks" Mobility   Outcome Measure  Help needed turning from your back to your side while in a flat bed without using bedrails?: A Lot Help needed moving from lying on your back to sitting on the side of a flat bed without using bedrails?: Total Help needed moving to and from a bed to a chair (including a wheelchair)?: Total Help needed standing up from a chair using your arms (e.g., wheelchair or bedside chair)?: Total Help needed to walk in hospital room?: Total Help needed climbing 3-5 steps with a railing? : Total 6 Click Score: 7    End of Session Equipment Utilized During Treatment: Oxygen (trach collar 02, PMSV) Activity Tolerance: Patient tolerated treatment well Patient left: in bed;with call bell/phone within reach;with bed alarm set Nurse Communication: Mobility status PT Visit Diagnosis: Unsteadiness on feet (R26.81);Muscle weakness (generalized) (M62.81);Difficulty in walking, not elsewhere classified (R26.2)     Time: 5859-2924 PT Time Calculation (min) (ACUTE ONLY): 29 min  Charges:  $Therapeutic Activity: 23-37 mins                     [name], PT, MPT    [name] 04/30/2022, 3:24 PM

## 2022-05-01 MED ORDER — APIXABAN 2.5 MG PO TABS
2.5000 mg | ORAL_TABLET | Freq: Two times a day (BID) | ORAL | Status: DC
  Administered 2022-05-01 – 2022-07-26 (×171): 2.5 mg via ORAL
  Filled 2022-05-01 (×181): qty 1

## 2022-05-01 MED ORDER — ACETAMINOPHEN 325 MG PO TABS
650.0000 mg | ORAL_TABLET | Freq: Once | ORAL | Status: AC
  Administered 2022-05-01: 650 mg via ORAL
  Filled 2022-05-01: qty 2

## 2022-05-01 NOTE — Progress Notes (Signed)
OT Cancellation Note  Patient Details Name: Gary [name] MRN: [id number] DOB: 06-07-1955   Cancelled Treatment:    Reason Eval/Treat Not Completed: Patient declined, no reason specified. OT arrived to patients room for therapeutic intervention and plans to stand but pt politely declined. Pt stating he has had multiple loose stools and would like therapist to return tomorrow. OT will re-attempt on 1/17 as time allows.   Darleen Crocker, MS, OTR/L , CBIS ascom [phone]  05/01/22, 3:04 PM

## 2022-05-01 NOTE — ED Provider Notes (Addendum)
Patient declining to take heparin injections for DVT prophylaxis. I considered sequential compression devices, but on further review it appears the patient is supposed to be on Eliquis for DVT prophylaxis.  This was not continued for him here in the ED, but prior note from Dr. Rupert Stacks indicates he is to use Eliquis for DVT prophylaxis.  Also his most recent discharge summary the notes as of January that he is supposed to be taking Eliquis as well.  Patient will be resumed on Eliquis for DVT prophylaxis.  No recent bleeding symptoms or episodes noted, and he is quite immobile placing him at risk for DVT development.  Hemoglobins have been stable for at least a month  Pharmacy consult placed for apixaban    Delman Kitten, MD 05/01/22 [id number]    Delman Kitten, MD 05/01/22 [phone]
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
## 2022-05-01 NOTE — ED Notes (Signed)
Patient provided with cola and sandwich tray per request

## 2022-05-01 NOTE — ED Notes (Signed)
Patient complaining of "his body aching". MD Cheri Fowler made aware.

## 2022-05-01 NOTE — ED Notes (Signed)
To pts room. Pt cleaned, purewick replaced, pt pulled up in bed.

## 2022-05-01 NOTE — ED Provider Notes (Signed)
-----------------------------------------   6:21 AM on 05/01/2022 -----------------------------------------   Blood pressure 116/79, pulse 83, temperature 98 F (36.7 C), resp. rate 18, SpO2 92 %.  The patient is calm and cooperative at this time.  There have been no acute events since the last update.  Awaiting disposition plan from case management/social work.    Yolunda Kloos, Delice Bison, DO 05/01/22 [phone]

## 2022-05-01 NOTE — ED Notes (Signed)
Pt assisted off bedpan.
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
## 2022-05-02 NOTE — Progress Notes (Signed)
Patient allowed Rt to perform trach care, and assist with suctioning. PT was educated and taught by RT staff on how to change his Lurline Idol, suction himself and place himself on his home Trilogy vent on his prior admission. Patient has performed these tasks in the past independently with observation and minimal assistance. RT offered to assist patient with Home Trilogy for HS and PT refused, saying that he was good at this time. Agricultural consultant notified. Patient stable at this time on 28% ATC.

## 2022-05-02 NOTE — TOC Progression Note (Signed)
Transition of Care [facility]) - Progression Note    Patient Details  Name: Gary [name] MRN: [id number] Date of Birth: Sep 04, 1955  Transition [facility]) CM/SW Contact  Shelbie Hutching, RN Phone Number: 05/02/2022, 2:39 PM  Clinical Narrative:    Never heard back from Santiago Glad at [facility], called her today and left a voicemail for return call.  [facility] in [place].  Spoke with Mardee Postin in admissions, she reports they have 3 vent facilities, they will take a look at the referral.  Referral secure emailed to [email or web address].     Expected Discharge Plan: [place] Barriers to Discharge: ED Unsafe disposition  Expected Discharge Plan and Services   Discharge Planning Services: CM Consult Post Acute Care Choice: [place] Living arrangements for the past 2 months: Single Family Home                 DME Arranged: N/A DME Agency: NA                   Social Determinants of Health (SDOH) Interventions SDOH Screenings   Food Insecurity: No Food Insecurity (04/15/2022)  Housing: Low Risk  (04/15/2022)  Transportation Needs: Unmet Transportation Needs (04/15/2022)  Utilities: Not At Risk (04/15/2022)  Recent Concern: Utilities - At Risk (02/05/2022)  Depression (PHQ2-9): Low Risk  (06/07/2021)  Financial Resource Strain: Medium Risk (05/03/2017)  Physical Activity: Insufficiently Active (05/03/2017)  Social Connections: Moderately Integrated (05/03/2017)  Stress: No Stress Concern Present (05/03/2017)  Tobacco Use: Medium Risk (04/26/2022)    Readmission Risk Interventions    12/31/2021    8:58 AM 12/20/2021    4:21 PM 05/04/2021    1:26 PM  Readmission Risk Prevention Plan  Transportation Screening Complete Complete Complete  Medication Review Press photographer) Complete Complete Complete  PCP or Specialist appointment within 3-5 days of discharge Complete Complete Complete  HRI or Home Care Consult Complete  Complete Complete  SW Recovery Care/Counseling Consult Complete Complete Complete  Palliative Care Screening Complete Complete Not [place] Not Applicable Not Applicable Not Applicable

## 2022-05-02 NOTE — ED Notes (Signed)
Pt resting in bed equal chest rise.

## 2022-05-02 NOTE — ED Notes (Addendum)
Respiratory at bedside.

## 2022-05-02 NOTE — ED Notes (Signed)
Patient is resting comfortably. 

## 2022-05-02 NOTE — Progress Notes (Signed)
RT arrived to assist PT with putting himself on his Home Trilogy unit, PT refused at this time. Patient is stable on ATC.
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
## 2022-05-02 NOTE — ED Notes (Signed)
Pt refusing to wear trilogy by respiratory. MD made aware.

## 2022-05-02 NOTE — ED Notes (Signed)
Pt awake. Pt repositioned in bed. Input of 100% of dinner.

## 2022-05-02 NOTE — Progress Notes (Signed)
Occupational Therapy Treatment Patient Details Name: Gary [name] MRN: [id number] DOB: 1955-11-24 Today's Date: 05/02/2022   History of present illness Gary [name] is a 65yoM who comes to [facility] on 04/26/21 reports his self care needs not being met after prior DC, staffing issues in home and reportedly not able to get his medications. Pt was DC from here 10 days prior after admission for respiratory distress, found to have active influenza infection, also CHF exacerbation. APS now involved in pt's case and he is in process of applying for medicare and obtaining facility placement. 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 At baseline, pt is lift transfers and total care +2, nonambulatory despite extensive time working with rehab in acute setting over several months with goals of maximizing independence in bed mobility and transfers.   OT comments  Upon entering the room, pt supine in bed and agreeable to OT intervention. Pt is agreeable to attempt standing with OT and PT during session. +2 assistance needed for bed mobility. Total A to don B slip on shoes. Pt does perform anterior weight shift lifting buttocks off of bed when scooting forward. +2 assistance to attempt stand from EOB with pt unable to clear buttocks. Pt remains encouraged and reporting wanting to continue to work on mobility next session and pt reporting he would like theraband for UE HEP. OT will provide next session. +2 assistance to return to bed . All needs within reach.    Recommendations for follow up therapy are one component of a multi-disciplinary discharge planning process, led by the attending physician.  Recommendations may be updated based on patient status, additional functional criteria and insurance authorization.    Follow Up Recommendations  Long-term institutional care without follow-up therapy     Assistance Recommended at Discharge Frequent or  constant Supervision/Assistance  Patient can return home with the following  Two people to help with walking and/or transfers;Two people to help with bathing/dressing/bathroom;Assistance with cooking/housework;Help with stairs or ramp for entrance;Assist for transportation   Equipment Recommendations  None recommended by OT       Precautions / Restrictions Precautions Precautions: Fall Precaution Comments: cuffed trach/ trach collar Restrictions Weight Bearing Restrictions: No RLE Weight Bearing: Weight bearing as tolerated       Mobility Bed Mobility Overal bed mobility: Needs Assistance Bed Mobility: Supine to Sit, Sit to Supine     Supine to sit: Max assist, +2 for physical assistance Sit to supine: Max assist, +2 for physical assistance   General bed mobility comments: assistance for trunk and BLE. cues for sequencing and technique    Transfers Overall transfer level: Needs assistance Equipment used: Rolling walker (2 wheels) Transfers: Sit to/from Stand             General transfer comment: attempted standing from bed with elevated bed height and +2 person assistance using bariatric rolling walker. patient was unable to clear buttocks from bed.     Balance Overall balance assessment: Needs assistance Sitting-balance support: Feet supported Sitting balance-[name] Scale: Fair                                     ADL either performed or assessed with clinical judgement   ADL Overall ADL's : Needs assistance/impaired  General ADL Comments: total A to don B shoes    Extremity/Trunk Assessment Upper Extremity Assessment Upper Extremity Assessment: Generalized weakness   Lower Extremity Assessment Lower Extremity Assessment: Generalized weakness        Vision Patient Visual Report: No change from baseline            Cognition Arousal/Alertness: Awake/alert Behavior During Therapy:  WFL for tasks assessed/performed Overall Cognitive Status: Within Functional Limits for tasks assessed                                                     Pertinent Vitals/ Pain       Pain Assessment Pain Assessment: No/denies pain         Frequency  Min 2X/week        Progress Toward Goals  OT Goals(current goals can now be found in the care plan section)  Progress towards OT goals: Progressing toward goals  Acute Rehab OT Goals Patient Stated Goal: to get stronger OT Goal Formulation: With patient Time For Goal Achievement: 05/30/22 Potential to Achieve Goals: [place] Discharge plan remains appropriate;Frequency remains appropriate    Co-evaluation      Reason for Co-Treatment: Complexity of the patient's impairments (multi-system involvement);Necessary to address cognition/behavior during functional activity;For patient/therapist safety PT goals addressed during session: Mobility/safety with mobility        AM-PAC OT "6 Clicks" Daily Activity     Outcome Measure   Help from another person eating meals?: None Help from another person taking care of personal grooming?: A Little Help from another person toileting, which includes using toliet, bedpan, or urinal?: Total Help from another person bathing (including washing, rinsing, drying)?: Total Help from another person to put on and taking off regular upper body clothing?: A Little Help from another person to put on and taking off regular lower body clothing?: Total 6 Click Score: 13    End of Session Equipment Utilized During Treatment: Rolling walker (2 wheels)  OT Visit Diagnosis: Other abnormalities of gait and mobility (R26.89);Muscle weakness (generalized) (M62.81)   Activity Tolerance Patient tolerated treatment well   Patient Left in bed;with bed alarm set;with call bell/phone within reach   Nurse Communication Mobility status        Time: 1340-1413 OT Time Calculation  (min): 33 min  Charges: OT General Charges $OT Visit: 1 Visit OT Treatments $Therapeutic Activity: 8-22 mins  Darleen Crocker, MS, OTR/L , CBIS ascom [phone]  05/02/22, 4:13 PM

## 2022-05-03 LAB — CBC
HCT: 38.7 % — ABNORMAL LOW (ref 39.0–52.0)
Hemoglobin: 10.6 g/dL — ABNORMAL LOW (ref 13.0–17.0)
MCH: 21.9 pg — ABNORMAL LOW (ref 26.0–34.0)
MCHC: 27.4 g/dL — ABNORMAL LOW (ref 30.0–36.0)
MCV: 80.1 fL (ref 80.0–100.0)
Platelets: 177 10*3/uL (ref 150–400)
RBC: 4.83 MIL/uL (ref 4.22–5.81)
RDW: 18.2 % — ABNORMAL HIGH (ref 11.5–15.5)
WBC: 7.2 10*3/uL (ref 4.0–10.5)
nRBC: 0.8 % — ABNORMAL HIGH (ref 0.0–0.2)

## 2022-05-03 NOTE — ED Notes (Signed)
This RN went to answer call bell for the pt, as this RN was approaching the pt's room, pt yelled out "to get someone's attention because no one was answering his call." This RN told the pt that I had just been informed that he called out and came to assist him when I finished with the pt I had been working with when I was notified. Pt loudly told me that no one had been to his room for hours. I informed the pt that I had been to his room and he had been resting, and so I didn't wake him. Pt told this RN that I was incorrect and that "we need to have a system so that he is checked on every 3-4 hours." I told him that I had been by his room at least once every hour to check on him. This RN also educated the pt that he was not the only pt that I am caring for today and that he needs to be patient, which the pt was not receptive to that information.

## 2022-05-03 NOTE — ED Notes (Signed)
Secretary notified this RN that pt called out needing "their nurse."

## 2022-05-03 NOTE — Progress Notes (Signed)
Physical Therapy Treatment Patient Details Name: Gary [name] MRN: [id number] DOB: [birth year] Today's Date: 05/03/2022   History of Present Illness Gary [name] is a 38yoM who comes to [facility] on 04/26/21 reports his self care needs not being met after prior DC, staffing issues in home and reportedly not able to get his medications. Pt was DC from here 10 days prior after admission for respiratory distress, found to have active influenza infection, also CHF exacerbation. APS now involved in pt's case and he is in process of applying for medicare and obtaining facility placement. 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 At baseline, pt is lift transfers and total care +2, nonambulatory despite extensive time working with rehab in acute setting over several months with goals of maximizing independence in bed mobility and transfers.   PT Comments    Patient is agreeable to PT but declined mobility. He wanted to perform LE exercises for strengthening which were performed with therapist guidance and with cues for technique. He is requesting to start getting out of bed to the chair using the lift which is appropriate and would facilitate upright conditioning in preparation for mobility efforts. PT will continue to follow to maximize independence and decrease burden of care.    Recommendations for follow up therapy are one component of a multi-disciplinary discharge planning process, led by the attending physician.  Recommendations may be updated based on patient status, additional functional criteria and insurance authorization.  Follow Up Recommendations  Long-term institutional care without follow-up therapy Can patient physically be transported by private vehicle: No   Assistance Recommended at Discharge Intermittent Supervision/Assistance  Patient can return home with the following Two people to help with  bathing/dressing/bathroom;Assistance with cooking/housework;Assist for transportation;Help with stairs or ramp for entrance;Two people to help with walking and/or transfers;Direct supervision/assist for medications management;Direct supervision/assist for financial management   Equipment Recommendations  None recommended by PT    Recommendations for Other Services       Precautions / Restrictions Precautions Precautions: Fall Restrictions Weight Bearing Restrictions: No     Mobility  Bed Mobility               General bed mobility comments: patient declined mobility today and requested in bed exercise for strengthening    Transfers                        Ambulation/Gait                   Stairs             Wheelchair Mobility    Modified Rankin (Stroke Patients Only)       Balance                                            Cognition Arousal/Alertness: Awake/alert Behavior During Therapy: WFL for tasks assessed/performed Overall Cognitive Status: Within Functional Limits for tasks assessed                                          Exercises General Exercises - Lower Extremity Ankle Circles/Pumps: AROM, Strengthening, Both, 10 reps, Supine Quad Sets: AROM, Strengthening, Both, 10 reps, Supine  Gluteal Sets: AROM, Strengthening, Both, 10 reps, Supine Heel Slides: AROM, Strengthening, Both, 10 reps, Supine Hip ABduction/ADduction: AROM, Strengthening, Both, 10 reps Other Exercises Other Exercises: verbal and visual cues for exercise technique    General Comments        Pertinent Vitals/Pain Pain Assessment Pain Assessment: No/denies pain    Home Living                          Prior Function            PT Goals (current goals can now be found in the care plan section) Acute Rehab PT Goals Patient Stated Goal: to be able to walk PT Goal Formulation: With patient Time For  Goal Achievement: 05/11/22 Potential to Achieve Goals: [place] Progress towards PT goals: Progressing toward goals    Frequency    Min 2X/week      PT Plan Current plan remains appropriate    Co-evaluation              AM-PAC PT "6 Clicks" Mobility   Outcome Measure  Help needed turning from your back to your side while in a flat bed without using bedrails?: A Lot Help needed moving from lying on your back to sitting on the side of a flat bed without using bedrails?: Total Help needed moving to and from a bed to a chair (including a wheelchair)?: Total Help needed standing up from a chair using your arms (e.g., wheelchair or bedside chair)?: Total Help needed to walk in hospital room?: Total Help needed climbing 3-5 steps with a railing? : Total 6 Click Score: 7    End of Session   Activity Tolerance: Patient tolerated treatment well Patient left: in bed;with call bell/phone within reach;with bed alarm set Nurse Communication: Mobility status PT Visit Diagnosis: Unsteadiness on feet (R26.81);Muscle weakness (generalized) (M62.81);Difficulty in walking, not elsewhere classified (R26.2)     Time: 6384-5364 PT Time Calculation (min) (ACUTE ONLY): 21 min  Charges:  $Therapeutic Exercise: 8-22 mins                     [name], PT, MPT    [name] 05/03/2022, 2:50 PM

## 2022-05-03 NOTE — ED Notes (Signed)
Pt requested to speak to the dr. I told the pt that I would let the MD know.

## 2022-05-03 NOTE — Progress Notes (Signed)
Suctioned pt trach for moderate amount of white/clear secretions, inner cannula changed, gauze changed. Pt did not want to use his home non invasive unit. Unit is at bedside, pt aware if he changes his mind and would like to use an RT will assist.

## 2022-05-03 NOTE — ED Notes (Signed)
Patient called out requesting bedpan. RN assisted pt with bedpan where pt had bm. Pt cleansed and brief and chuck changed. Pt then assisted with repositioning for comfort. Pt denies pain or other needs at this time. Pt bed is low and locked with side rails raised x4 (per pt request). Call bell is in reach and belongings as well.

## 2022-05-03 NOTE — ED Notes (Signed)
Patient taken off bedpan and cleaned. New sheet and chuck pads placed. Patient repositioned in bed. Call light within reach. Bed locked and lowered. No other needs expressed at this time.

## 2022-05-03 NOTE — ED Notes (Signed)
Pt assisted in changing trach dressing.

## 2022-05-03 NOTE — ED Notes (Signed)
Pt repositioned per request. Given ice water. In NAD at this time. Call bell within reach.

## 2022-05-03 NOTE — ED Notes (Signed)
Pt was transferred to a stretcher and then transferred back to the hospital bed so that the hospital bed could be zeroed to get an accurate weight. This was done by this RN, Anda Kraft, RN; Marlane Mingle, NT; and a security guard.

## 2022-05-03 NOTE — ED Provider Notes (Signed)
-----------------------------------------   5:52 AM on 05/03/2022 -----------------------------------------   Blood pressure 103/69, pulse 89, temperature 98.4 F (36.9 C), temperature source Oral, resp. rate 19, SpO2 95 %.  The patient is calm and cooperative at this time.  There have been no acute events since the last update.  Awaiting disposition plan from [facility] team.   Hinda Kehr, MD 05/03/22 [phone]

## 2022-05-03 NOTE — ED Notes (Signed)
Patient assisted onto bed pan by this RN and multiple other RN's including charge RN. Patient yelling at staff while staff trying to assist patient with needs. Patient made aware that he should not yell at staff. Call light within reach.

## 2022-05-03 NOTE — ED Notes (Signed)
Pt rang call bed because the hospital bed remote was disconnected and needed to get reconnected, which this RN reconnected. Pt voiced no other needs at this time.

## 2022-05-03 NOTE — TOC Progression Note (Signed)
Transition of Care [facility]) - Progression Note    Patient Details  Name: Gary [name] MRN: [id number] Date of Birth: 07-12-1955  Transition of Care [facility]) CM/SW Contact  Shelbie Hutching, RN Phone Number: 05/03/2022, 4:17 PM  Clinical Narrative:    RNCM updated patient at the bedside this afternoon.   Received a call from [place], they can accept the patient but they need him to lose 3 pounds.  For the facility he needs to be less than 350 pounds.  Asked nursing staff if they could get a current weight.  The facility will need to start a [place] application.    Expected Discharge Plan: [place] Barriers to Discharge: ED Unsafe disposition  Expected Discharge Plan and Services   Discharge Planning Services: CM Consult Post Acute Care Choice: [place] Living arrangements for the past 2 months: Single Family Home                 DME Arranged: N/A DME Agency: NA                   Social Determinants of Health (SDOH) Interventions SDOH Screenings   Food Insecurity: No Food Insecurity (04/15/2022)  Housing: Low Risk  (04/15/2022)  Transportation Needs: Unmet Transportation Needs (04/15/2022)  Utilities: Not At Risk (04/15/2022)  Recent Concern: Utilities - At Risk (02/05/2022)  Depression (PHQ2-9): Low Risk  (06/07/2021)  Financial Resource Strain: Medium Risk (05/03/2017)  Physical Activity: Insufficiently Active (05/03/2017)  Social Connections: Moderately Integrated (05/03/2017)  Stress: No Stress Concern Present (05/03/2017)  Tobacco Use: Medium Risk (04/26/2022)    Readmission Risk Interventions    12/31/2021    8:58 AM 12/20/2021    4:21 PM 05/04/2021    1:26 PM  Readmission Risk Prevention Plan  Transportation Screening Complete Complete Complete  Medication Review Press photographer) Complete Complete Complete  PCP or Specialist appointment within 3-5 days of discharge Complete Complete Complete  HRI or Home Care Consult  Complete Complete Complete  SW Recovery Care/Counseling Consult Complete Complete Complete  Palliative Care Screening Complete Complete Not [place] Not Applicable Not Applicable Not Applicable

## 2022-05-04 NOTE — ED Notes (Addendum)
During cleaning pt up, pt buttocks is having minimal flaky skin breakdown on buttocks. No pink color or redness on buttocks currently. Lotion placed at bedside.

## 2022-05-04 NOTE — ED Notes (Signed)
Pt repositioned in the bed by this RN & EDT ([facility]) - RT at the bedside.

## 2022-05-04 NOTE — ED Notes (Signed)
Respiratory at bedside.

## 2022-05-04 NOTE — ED Notes (Signed)
Pt tolerated taking nighttime meds well. Pt repositioned in bed.

## 2022-05-04 NOTE — ED Notes (Addendum)
This RN, Doroteo Bradford, RN Social Work, and multiple NT's in room with this patient. This patient extremely irritated with his care in the ED. States he needs to be transferred out of the ED to ICU or another place in the hospital because he does not feel the care he needs is occurring here. Also made threats about staff members because they walk by and do not even see what he needs when he calls. Also stated he was going to report [place] due to our not immediate care. See social work and NT's notes. Pt was cleaned up by myself and NT Danielle and in addition changed gown and pads for patient. RN Doroteo Bradford also got patient full water cup of ice water and helped with patient care. Pt does have minimal skin breakdown on buttocks area. Pt repositioned with pillows.

## 2022-05-04 NOTE — Progress Notes (Signed)
Occupational Therapy Treatment Patient Details Name: Gary [name] MRN: [id number] DOB: July 28, 1955 Today's Date: 05/04/2022   History of present illness Gary [name] is a 48yoM who comes to [facility] on 04/26/21 reports his self care needs not being met after prior DC, staffing issues in home and reportedly not able to get his medications. Pt was DC from here 10 days prior after admission for respiratory distress, found to have active influenza infection, also CHF exacerbation. APS now involved in pt's case and he is in process of applying for medicare and obtaining facility placement. 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 At baseline, pt is lift transfers and total care +2, nonambulatory despite extensive time working with rehab in acute setting over several months with goals of maximizing independence in bed mobility and transfers.   OT comments  Upon entering the room, pt supine in bed with covers pulled on him. OT arriving to room with red and green level theraband per pt request last session for UE HEP. Pt thanks therapist. OT demonstrates several exercises and pt then declines to return demonstration and states, " I know how to do them" and then, " I am cold" and pulls covers over him once more. OT left therabands in room with pt attached to hospital bed rails. Pt declined any further activity at this time.    Recommendations for follow up therapy are one component of a multi-disciplinary discharge planning process, led by the attending physician.  Recommendations may be updated based on patient status, additional functional criteria and insurance authorization.    Follow Up Recommendations  Long-term institutional care without follow-up therapy     Assistance Recommended at Discharge Intermittent Supervision/Assistance  Patient can return home with the following  Two people to help with walking and/or transfers;Two people  to help with bathing/dressing/bathroom;Assistance with cooking/housework;Help with stairs or ramp for entrance;Assist for transportation   Equipment Recommendations  None recommended by OT       Precautions / Restrictions Precautions Precautions: Fall Precaution Comments: cuffed trach/ trach collar              ADL either performed or assessed with clinical judgement    Extremity/Trunk Assessment Upper Extremity Assessment Upper Extremity Assessment: Generalized weakness   Lower Extremity Assessment Lower Extremity Assessment: Generalized weakness        Vision Patient Visual Report: No change from baseline            Cognition Arousal/Alertness: Awake/alert Behavior During Therapy: WFL for tasks assessed/performed Overall Cognitive Status: Within Functional Limits for tasks assessed                                                     Pertinent Vitals/ Pain       Pain Assessment Pain Assessment: No/denies pain         Frequency  Min 2X/week        Progress Toward Goals  OT Goals(current goals can now be found in the care plan section)  Progress towards OT goals: Progressing toward goals  Acute Rehab OT Goals Patient Stated Goal: to get stronger and stand again OT Goal Formulation: With patient Time For Goal Achievement: 05/30/22 Potential to Achieve Goals: [place] Discharge plan remains appropriate;Frequency remains appropriate  AM-PAC OT "6 Clicks" Daily Activity     Outcome Measure   Help from another person eating meals?: None Help from another person taking care of personal grooming?: A Little Help from another person toileting, which includes using toliet, bedpan, or urinal?: Total Help from another person bathing (including washing, rinsing, drying)?: Total Help from another person to put on and taking off regular upper body clothing?: A Little Help from another person to put on and taking off regular lower  body clothing?: Total 6 Click Score: 13    End of Session Equipment Utilized During Treatment: Rolling walker (2 wheels)  OT Visit Diagnosis: Other abnormalities of gait and mobility (R26.89);Muscle weakness (generalized) (M62.81)   Activity Tolerance     Patient Left in bed;with bed alarm set;with call bell/phone within reach   Nurse Communication Mobility status        Time: 3825-0539 OT Time Calculation (min): 11 min  Charges: OT General Charges $OT Visit: 1 Visit OT Treatments $Therapeutic Activity: 8-22 mins  Darleen Crocker, MS, OTR/L , CBIS ascom [phone]  05/04/22, 3:31 PM

## 2022-05-04 NOTE — ED Notes (Signed)
Patient provided with ice water per request. Pt sleeping on entry into room. Breathing unlabored with symmetric chest rise and fall. Bed low and locked with side rails raised x4. Call bell in reach as well as belongings. TV on and lights off per pt preferences.

## 2022-05-04 NOTE — TOC Progression Note (Signed)
Transition of Care [facility]) - Progression Note    Patient Details  Name: Gary [name] MRN: [id number] Date of Birth: July 11, 1955  Transition of Care [facility]) CM/SW Contact  Shelbie Hutching, RN Phone Number: 05/04/2022, 12:24 PM  Clinical Narrative:    RNCM asked nursing staff to reweigh patient.  Patient's current weight is 177.4 kg- 391 pounds.  [place] facilities have a weight limit of 350 pounds.    Expected Discharge Plan: [place] Barriers to Discharge: ED Unsafe disposition  Expected Discharge Plan and Services   Discharge Planning Services: CM Consult Post Acute Care Choice: [place] Living arrangements for the past 2 months: Single Family Home                 DME Arranged: N/A DME Agency: NA                   Social Determinants of Health (SDOH) Interventions SDOH Screenings   Food Insecurity: No Food Insecurity (04/15/2022)  Housing: Low Risk  (04/15/2022)  Transportation Needs: Unmet Transportation Needs (04/15/2022)  Utilities: Not At Risk (04/15/2022)  Recent Concern: Utilities - At Risk (02/05/2022)  Depression (PHQ2-9): Low Risk  (06/07/2021)  Financial Resource Strain: Medium Risk (05/03/2017)  Physical Activity: Insufficiently Active (05/03/2017)  Social Connections: Moderately Integrated (05/03/2017)  Stress: No Stress Concern Present (05/03/2017)  Tobacco Use: Medium Risk (04/26/2022)    Readmission Risk Interventions    12/31/2021    8:58 AM 12/20/2021    4:21 PM 05/04/2021    1:26 PM  Readmission Risk Prevention Plan  Transportation Screening Complete Complete Complete  Medication Review Press photographer) Complete Complete Complete  PCP or Specialist appointment within 3-5 days of discharge Complete Complete Complete  HRI or Home Care Consult Complete Complete Complete  SW Recovery Care/Counseling Consult Complete Complete Complete  Palliative Care Screening Complete Complete Not [place]  Not Applicable Not Applicable Not Applicable

## 2022-05-04 NOTE — ED Notes (Signed)
This tech and other nursing staff members at bedside to clean pt. Pt starting disrespecting this tech at bedside demanding that I need to be in his room as soon as he pushes his call bell. Informed pt that this tech has other pts to take care of as well and I could not always be there at the moment he needs me. Pt started yelling and cursing at this tech. Pt demanding tech to get out of his room and not return.

## 2022-05-04 NOTE — ED Notes (Addendum)
Pt cleaned up with NT Stephanie Pt has new pads, underwear, and new purewick placed on patient. Pt has minimal breakdown on buttocks area. Pt repositioned. Cardiac monitoring discontinued. Respiratory came to bedside to bring patient new gauze for trach.

## 2022-05-04 NOTE — Progress Notes (Signed)
RT changed trach to 7.0 xlt proximal cuffless per Dr's order without incidet. Hr 85,rr 18,sat 95%,pt. Returned to trach collar at 28% fio2.

## 2022-05-04 NOTE — ED Notes (Signed)
PT and NT's weighed patient with a hoyer lift and patient was between 391-394lbs.

## 2022-05-04 NOTE — TOC Progression Note (Signed)
Transition of Care [facility]) - Progression Note    Patient Details  Name: Gary [name] MRN: [id number] Date of Birth: 10/08/1955  Transition of Care [facility]) CM/SW Contact  Shelbie Hutching, RN Phone Number: 05/04/2022, 3:11 PM  Clinical Narrative:    Met with patient at the bedside, nursing staff and nurse techs present they are ready to help patient get cleaned up and change linens.  Patient is angry and being verbally aggressive and confrontational.  Patient is cursing and complaining about being mistreated here in the ED.  He stresses how he needs to be in a bed on the floor where he can be taken care of.  Inform patient that he does not need to be admitted so he cannot go to a floor bed.  TOC team is actively working on placement but there are barriers related to his weight, trach, and being on the vent at night.   Patient's accurate weight per the hoyer lift is 177.4.  RNCM will be reaching out to facilities again in [place] and see if there are any others in [place] that may take bariatric patients.    Patient has been using threatening language, making threats that if things don't change he is going to hurt someone, he will hit someone upside the head with a soda can, he says he has been to prison 10 times.  He also says he is not worried about consequences of hurting staff because he can't walk and has a trach so he won't go to jail.      Expected Discharge Plan: [place] Barriers to Discharge: ED Unsafe disposition  Expected Discharge Plan and Services   Discharge Planning Services: CM Consult Post Acute Care Choice: [place] Living arrangements for the past 2 months: Single Family Home                 DME Arranged: N/A DME Agency: NA                   Social Determinants of Health (SDOH) Interventions SDOH Screenings   Food Insecurity: No Food Insecurity (04/15/2022)  Housing: Low Risk  (04/15/2022)  Transportation Needs: Unmet  Transportation Needs (04/15/2022)  Utilities: Not At Risk (04/15/2022)  Recent Concern: Utilities - At Risk (02/05/2022)  Depression (PHQ2-9): Low Risk  (06/07/2021)  Financial Resource Strain: Medium Risk (05/03/2017)  Physical Activity: Insufficiently Active (05/03/2017)  Social Connections: Moderately Integrated (05/03/2017)  Stress: No Stress Concern Present (05/03/2017)  Tobacco Use: Medium Risk (04/26/2022)    Readmission Risk Interventions    12/31/2021    8:58 AM 12/20/2021    4:21 PM 05/04/2021    1:26 PM  Readmission Risk Prevention Plan  Transportation Screening Complete Complete Complete  Medication Review Press photographer) Complete Complete Complete  PCP or Specialist appointment within 3-5 days of discharge Complete Complete Complete  HRI or Home Care Consult Complete Complete Complete  SW Recovery Care/Counseling Consult Complete Complete Complete  Palliative Care Screening Complete Complete Not [place] Not Applicable Not Applicable Not Applicable

## 2022-05-04 NOTE — ED Notes (Addendum)
Agricultural consultant and ED AD into patients room to make patient aware that threatening staff will not be tolerated and guidelines set in place that staff will not come in alone, pt is not allowed to have soda cans and if he uses any hard objects to threaten staff with, those will be removed too. Pt also notified that [employer] was contacted to file a report d/t staff being threatened.   [employer] was called and is on the way to obtain report.

## 2022-05-05 NOTE — ED Provider Notes (Signed)
-----------------------------------------   9:08 AM on 05/05/2022 -----------------------------------------   Blood pressure 111/78, pulse 81, temperature 98.4 F (36.9 C), temperature source Oral, resp. rate 20, height 1.905 m ('6\' 3"'$ ), weight (!) 177.4 kg, SpO2 95 %.  The patient is calm and cooperative at this time.  There have been no acute events since the last update.  Awaiting disposition plan from [facility] team.   Hinda Kehr, MD 05/05/22 [phone]

## 2022-05-05 NOTE — Progress Notes (Signed)
Patient has requested to use his home vent for bedtime use. Vent powers on. Alarms are audible. Circuit connected. O2 in line at 2 liters. Patient noted to be in no distress during use and stated all was well during my presences. Will continue to monitor.

## 2022-05-05 NOTE — Progress Notes (Signed)
Rounding on patient during shift. Patient ready to go on vent for sleep. Patient able to perform trach care prior to vent. Did not require suctioning as he does well coughing out vs suctioning. Patient was able to place himself on home vent without difficulty.  Alarms audible. Oxygen at 2 liters in line with vent. Vitals stable. Patient expresses no concerns at this time.

## 2022-05-05 NOTE — ED Notes (Signed)
Pericare performed, linens changed.

## 2022-05-05 NOTE — ED Notes (Signed)
Report received. This RN now assuming care.

## 2022-05-05 NOTE — ED Notes (Signed)
RT @ the bedside.

## 2022-05-05 NOTE — Progress Notes (Signed)
Patient off home vent and placed back on 28% trach collar. Airway suctioned for moderate amount of thick white secretions. Vitals stable. Patient tolerated interventions well.

## 2022-05-05 NOTE — ED Notes (Signed)
Pt called staff in, pt placed on bedpan and had large BM. Pt was cleaned up and pad changed. Pt given sandwhich and beverage and denies any further needs at this time.

## 2022-05-06 NOTE — Progress Notes (Signed)
Rounding on patient during shift. Patient watching television. Vitals stable, no distress noted. No respiratory interventions needed at this time.

## 2022-05-06 NOTE — ED Provider Notes (Signed)
05/06/2022    6:15 AM 05/06/2022    1:13 AM 05/06/2022    1:10 AM  Vitals with BMI  Systolic 217    Diastolic 78    Pulse 83 84 85    Patient resting comfortably during my shift. No acute events. Patient is awaiting SW disposition.    Rada Hay, MD 05/06/22 [phone]

## 2022-05-06 NOTE — ED Notes (Signed)
Two ice water and cola provided per request. Repositioned for comfort. Denies additional needs at this time.

## 2022-05-06 NOTE — ED Notes (Signed)
Pt was noted to be de-satting. This RN entered room to find pt's vent had popped off his trach. Vent replaced and saturation quickly came back up. Pt appeared to stay asleep.

## 2022-05-07 NOTE — Progress Notes (Signed)
Physical Therapy Treatment Patient Details Name: Gary [name] MRN: [id number] DOB: 01-Jun-1955 Today's Date: 05/07/2022   History of Present Illness Gary [name] is a 54yoM who comes to [facility] on 04/26/21 reports his self care needs not being met after prior DC, staffing issues in home and reportedly not able to get his medications. Pt was DC from here 10 days prior after admission for respiratory distress, found to have active influenza infection, also CHF exacerbation. APS now involved in pt's case and he is in process of applying for medicare and obtaining facility placement. 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 At baseline, pt is lift transfers and total care +2, nonambulatory despite extensive time working with rehab in acute setting over several months with goals of maximizing independence in bed mobility and transfers.    PT Comments    Patient is agreeable to PT session. He was calm and cooperative throughout session. The continues to require physical assistance for bed mobility. Education and reinforcement of transfer techniques to facilitate independence and lift off from bed. Patient participates with anterior weight shifting in preparation for standing but does not actually initiate standing due to generalized weakness. Encouraged home exercises to facilitate independence and for increased readiness to stand. Recommend to continue PT to maximize independence and decrease caregiver burden.    Recommendations for follow up therapy are one component of a multi-disciplinary discharge planning process, led by the attending physician.  Recommendations may be updated based on patient status, additional functional criteria and insurance authorization.  Follow Up Recommendations  Long-term institutional care without follow-up therapy Can patient physically be transported by private vehicle: No   Assistance Recommended at  Discharge Intermittent Supervision/Assistance  Patient can return home with the following Two people to help with bathing/dressing/bathroom;Assistance with cooking/housework;Assist for transportation;Help with stairs or ramp for entrance;Two people to help with walking and/or transfers;Direct supervision/assist for medications management;Direct supervision/assist for financial management   Equipment Recommendations  None recommended by PT    Recommendations for Other Services       Precautions / Restrictions Precautions Precautions: Fall Restrictions Weight Bearing Restrictions: No     Mobility  Bed Mobility Overal bed mobility: Needs Assistance Bed Mobility: Supine to Sit, Sit to Supine Rolling: Min assist   Supine to sit: Mod assist, +2 for physical assistance, HOB elevated Sit to supine: Mod assist, +2 for physical assistance   General bed mobility comments: heavy use of bed rails with head of bed near maximally elevated with sitting upright. verbal cues for sequencing and technique. increased time and effort required    Transfers                   General transfer comment: attempted standing from bed with verbal cues and reinforcement of anterior weight shifting, foot positioning to facilitate independence. patient performs anterior weight shifting but does not initiate with standing attempts due to feeling generalized weakness despite encouragement. continue to recommend to use the lift for routine OOB activity.    Ambulation/Gait                   Stairs             Wheelchair Mobility    Modified Rankin (Stroke Patients Only)       Balance Overall balance assessment: Needs assistance Sitting-balance support: Feet supported Sitting balance-[name] Scale: Fair  Cognition Arousal/Alertness: Awake/alert Behavior During Therapy: WFL for tasks assessed/performed Overall Cognitive Status:  Within Functional Limits for tasks assessed                                 General Comments: patient is clam and cooperative throughout session        Exercises      General Comments General comments (skin integrity, edema, etc.): patient completed one LAQ independently on each LE while sitting. encouraged patient to perform home exercises to increase independence with functional activity      Pertinent Vitals/Pain Pain Assessment Pain Assessment: No/denies pain    Home Living                          Prior Function            PT Goals (current goals can now be found in the care plan section) Acute Rehab PT Goals Patient Stated Goal: to be able to stand PT Goal Formulation: With patient Time For Goal Achievement: 05/11/22 Potential to Achieve Goals: Poor Progress towards PT goals: Progressing toward goals    Frequency    Min 2X/week      PT Plan Current plan remains appropriate    Co-evaluation PT/OT/SLP Co-Evaluation/Treatment: Yes Reason for Co-Treatment: To address functional/ADL transfers;For patient/therapist safety PT goals addressed during session: Mobility/safety with mobility        AM-PAC PT "6 Clicks" Mobility   Outcome Measure  Help needed turning from your back to your side while in a flat bed without using bedrails?: A Lot Help needed moving from lying on your back to sitting on the side of a flat bed without using bedrails?: Total Help needed moving to and from a bed to a chair (including a wheelchair)?: Total Help needed standing up from a chair using your arms (e.g., wheelchair or bedside chair)?: Total Help needed to walk in hospital room?: Total Help needed climbing 3-5 steps with a railing? : Total 6 Click Score: 7    End of Session Equipment Utilized During Treatment: Oxygen Activity Tolerance: Patient tolerated treatment well Patient left: in bed;with call bell/phone within reach   PT Visit Diagnosis:  Unsteadiness on feet (R26.81);Muscle weakness (generalized) (M62.81);Difficulty in walking, not elsewhere classified (R26.2)     Time: 1638-4536 PT Time Calculation (min) (ACUTE ONLY): 33 min  Charges:  $Therapeutic Activity: 8-22 mins                     [name], PT, MPT    [name] 05/07/2022, 2:50 PM

## 2022-05-07 NOTE — ED Provider Notes (Signed)
05/06/2022    7:21 PM 05/06/2022    9:30 AM 05/06/2022    9:15 AM  Vitals with BMI  Systolic 300    Diastolic 73    Pulse 93 88 84    Patient resting comfortably.  No acute changes to patient's care plan during my shift.  He is pending social work disposition   Starleen Blue, Jennette Dubin, MD 05/07/22 [phone]

## 2022-05-07 NOTE — Progress Notes (Signed)
Occupational Therapy Treatment Patient Details Name: Gary [name] MRN: [id number] DOB: 10-24-1955 Today's Date: 05/07/2022   History of present illness Remon Quinto is a 59yoM who comes to [facility] on 04/26/21 reports his self care needs not being met after prior DC, staffing issues in home and reportedly not able to get his medications. Pt was DC from here 10 days prior after admission for respiratory distress, found to have active influenza infection, also CHF exacerbation. APS now involved in pt's case and he is in process of applying for medicare and obtaining facility placement. 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 At baseline, pt is lift transfers and total care +2, nonambulatory despite extensive time working with rehab in acute setting over several months with goals of maximizing independence in bed mobility and transfers.   OT comments  Upon entering the room, pt supine in bed but pleasant and agreeable to work with therapy today. Pt seen for skilled co-treatment with PT. Pt needing mod A of 2 for B LEs and trunk to sit on EOB. Pt tolerated sitting with supervision and multiple bouts of anterior weight shift without assistance. He was unable to attempt standing and reports feeling too weak to attempt. Plan to try a different bari walker next session and continue attempts for functional mobility. Pt returns to supine with mod A of 2 and rolling to the R with max A to place chux pad. Bed placed into trendelenburg and pt utilized bed rails to pull himself up in bed. Total A to don/doff B shoes for session.    Recommendations for follow up therapy are one component of a multi-disciplinary discharge planning process, led by the attending physician.  Recommendations may be updated based on patient status, additional functional criteria and insurance authorization.    Follow Up Recommendations  Long-term institutional care  without follow-up therapy     Assistance Recommended at Discharge Intermittent Supervision/Assistance  Patient can return home with the following  Two people to help with walking and/or transfers;Two people to help with bathing/dressing/bathroom;Assistance with cooking/housework;Help with stairs or ramp for entrance;Assist for transportation   Equipment Recommendations  None recommended by OT       Precautions / Restrictions Precautions Precautions: Fall Precaution Comments: cuffed trach/ trach collar Restrictions Weight Bearing Restrictions: No       Mobility Bed Mobility Overal bed mobility: Needs Assistance Bed Mobility: Supine to Sit, Sit to Supine     Supine to sit: Mod assist, +2 for physical assistance, HOB elevated Sit to supine: Mod assist, +2 for physical assistance   General bed mobility comments: heavy use of bed rails with head of bed near maximally elevated with sitting upright. verbal cues for sequencing and technique. increased time and effort required    Transfers                   General transfer comment: attempted standing from bed with verbal cues and reinforcement of anterior weight shifting, foot positioning to facilitate independence. patient performs anterior weight shifting but does not initiate with standing attempts due to feeling generalized weakness despite encouragement. continue to recommend to use the lift for routine OOB activity.     Balance Overall balance assessment: Needs assistance Sitting-balance support: Feet supported Sitting balance-[name] Scale: Fair  ADL either performed or assessed with clinical judgement    Extremity/Trunk Assessment Upper Extremity Assessment Upper Extremity Assessment: Generalized weakness   Lower Extremity Assessment Lower Extremity Assessment: Generalized weakness        Vision Patient Visual Report: No change from baseline             Cognition Arousal/Alertness: Awake/alert Behavior During Therapy: WFL for tasks assessed/performed Overall Cognitive Status: Within Functional Limits for tasks assessed                                 General Comments: patient is clam and cooperative throughout session              General Comments patient completed one LAQ independently on each LE while sitting. encouraged patient to perform home exercises to increase independence with functional activity    Pertinent Vitals/ Pain       Pain Assessment Pain Assessment: No/denies pain         Frequency  Min 2X/week        Progress Toward Goals  OT Goals(current goals can now be found in the care plan section)  Progress towards OT goals: Progressing toward goals  Acute Rehab OT Goals Patient Stated Goal: to get stronger OT Goal Formulation: With patient Time For Goal Achievement: 05/30/22 Potential to Achieve Goals: [place] Discharge plan remains appropriate;Frequency remains appropriate    Co-evaluation      Reason for Co-Treatment: For patient/therapist safety;To address functional/ADL transfers PT goals addressed during session: Mobility/safety with mobility OT goals addressed during session: ADL's and self-care      AM-PAC OT "6 Clicks" Daily Activity     Outcome Measure   Help from another person eating meals?: None Help from another person taking care of personal grooming?: A Little Help from another person toileting, which includes using toliet, bedpan, or urinal?: Total Help from another person bathing (including washing, rinsing, drying)?: Total Help from another person to put on and taking off regular upper body clothing?: A Little Help from another person to put on and taking off regular lower body clothing?: Total 6 Click Score: 13    End of Session Equipment Utilized During Treatment: Rolling walker (2 wheels)  OT Visit Diagnosis: Other abnormalities of gait and mobility  (R26.89);Muscle weakness (generalized) (M62.81)   Activity Tolerance Patient tolerated treatment well   Patient Left in bed;with bed alarm set;with call bell/phone within reach   Nurse Communication Mobility status        Time: 1336-1410 OT Time Calculation (min): 34 min  Charges: OT General Charges $OT Visit: 1 Visit OT Treatments $Therapeutic Activity: 8-22 mins  Darleen Crocker, MS, OTR/L , CBIS ascom [phone]  05/07/22, 3:20 PM

## 2022-05-08 NOTE — ED Notes (Signed)
EDT Sharman Crate, RN Anders Grant gave patient a BED BATH TONIGHT

## 2022-05-08 NOTE — ED Notes (Signed)
Pt provided a full bed bath with linen change by this RN, Annie Main, [facility] [place]). Pt positioned comfortably in bed; Mepilex applied & barrier cream. Call bell within reach. No additional concerns at this time.

## 2022-05-08 NOTE — Progress Notes (Signed)
RT brought trach supplies to room. Pt stated that he does everything himself. I asked him if he needs anything at this time he stated "I am all set, thanks for checking on me". Will continue to monitor

## 2022-05-08 NOTE — Progress Notes (Signed)
PT Cancellation Note  Patient Details Name: BURLEIGH BROCKMANN MRN: [id number] DOB: 1955-05-17   Cancelled Treatment:    Reason Eval/Treat Not Completed: Patient declined, no reason specified (patient requesting PT return tomorrow, too late in the day today.)  [name], PT, MPT  [name] 05/08/2022, 3:58 PM

## 2022-05-08 NOTE — ED Provider Notes (Signed)
-----------------------------------------   5:53 AM on 05/08/2022 -----------------------------------------   Blood pressure 111/74, pulse 86, temperature 98.4 F (36.9 C), temperature source Oral, resp. rate 18, height '6\' 3"'$  (1.905 m), weight (!) 177.4 kg, SpO2 95 %.  The patient is resting comfortably in his room.  There have been no acute events since the last update.  Awaiting disposition plan from Social Work team(s).    Arta Silence, MD 05/08/22 [phone]

## 2022-05-08 NOTE — ED Notes (Signed)
Pt took off bedpan stating, "I cannot have a BM at this time. I'll try again later". No other needs voiced at this time.

## 2022-05-08 NOTE — ED Notes (Signed)
RT notified to set up the patients night time vent.

## 2022-05-08 NOTE — Progress Notes (Signed)
Patient has used is home vent last four nights during my shifts from appx 1130 pm to 600 am with 2 liter bleed in. Patient rested comfortably throughout each night with no distress. Patient has been able to change his own trach dressing as well trach inner cannula without difficulty.

## 2022-05-08 NOTE — ED Notes (Signed)
RT @ the bedside.

## 2022-05-08 NOTE — ED Notes (Signed)
Pt placed on bedpan by this tech and Danielle, EDT.

## 2022-05-09 NOTE — ED Notes (Signed)
RT at the bedside.

## 2022-05-09 NOTE — ED Notes (Signed)
This RN with the help of Jefm Petty, NT; completed a full bed change, pericare was performed, a new male purewick was placed along with a new mepilex pad. Pt's bed was placed in the lowest, locked position, call bell is within reach. No other needs were voiced.

## 2022-05-09 NOTE — ED Provider Notes (Signed)
-----------------------------------------   5:43 AM on 05/09/2022 -----------------------------------------   Blood pressure 111/71, pulse 81, temperature 98 F (36.7 C), temperature source Oral, resp. rate 18, height '6\' 3"'$  (1.905 m), weight (!) 177.4 kg, SpO2 93 %.  The patient is calm and cooperative at this time.  There have been no acute events since the last update.  Awaiting disposition plan from case management/social work.    Mckenna Boruff, Delice Bison, DO 05/09/22 [phone]

## 2022-05-09 NOTE — ED Notes (Signed)
RT @ the bedside.

## 2022-05-09 NOTE — ED Notes (Signed)
Pt had a bowel movement, and this RN with the help of Caitlin, EMT provided pericare, and changed pt's chuck pads and mepilex. No other needs voiced at this time.

## 2022-05-09 NOTE — ED Notes (Signed)
Pt asked for sandwich tray

## 2022-05-09 NOTE — Progress Notes (Signed)
Physical Therapy Treatment Patient Details Name: Gary [name] MRN: [id number] DOB: Feb 13, 1956 Today's Date: 05/09/2022   History of Present Illness Gary [name] is a 61yoM who comes to [facility] on 04/26/21 reports his self care needs not being met after prior DC, staffing issues in home and reportedly not able to get his medications. Pt was DC from here 10 days prior after admission for respiratory distress, found to have active influenza infection, also CHF exacerbation. APS now involved in pt's case and he is in process of applying for medicare and obtaining facility placement. 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 At baseline, pt is lift transfers and total care +2, nonambulatory despite extensive time working with rehab in acute setting over several months with goals of maximizing independence in bed mobility and transfers.    PT Comments    Patient is agreeable to PT. He was fatigued with activity, requiring frequent rest breaks during mobility efforts. He was able to stand x 2 bouts for ~ 20 seconds each time with +2 person assistance and using the bariatric rolling walker. Unable to progress to ambulation this session. Recommend to continue PT to maximize independence and facilitate independence. Recommend routine out of bed to chair activity using the mechanical lift, in order to promote upright conditioning and readiness for increased mobility.    Recommendations for follow up therapy are one component of a multi-disciplinary discharge planning process, led by the attending physician.  Recommendations may be updated based on patient status, additional functional criteria and insurance authorization.  Follow Up Recommendations  Long-term institutional care without follow-up therapy Can patient physically be transported by private vehicle: No   Assistance Recommended at Discharge Intermittent Supervision/Assistance   Patient can return home with the following Two people to help with bathing/dressing/bathroom;Assistance with cooking/housework;Assist for transportation;Help with stairs or ramp for entrance;Two people to help with walking and/or transfers;Direct supervision/assist for medications management;Direct supervision/assist for financial management   Equipment Recommendations  None recommended by PT    Recommendations for Other Services       Precautions / Restrictions Precautions Precautions: Fall Restrictions Weight Bearing Restrictions: No     Mobility  Bed Mobility Overal bed mobility: Needs Assistance Bed Mobility: Supine to Sit, Sit to Supine     Supine to sit: Mod assist, +2 for physical assistance Sit to supine: Max assist   General bed mobility comments: verbal cues for sequencing. patient requires frequent rest breaks due to fatigue    Transfers Overall transfer level: Needs assistance Equipment used: Rolling walker (2 wheels) (green bariatric RW) Transfers: Sit to/from Stand Sit to Stand: Max assist, +2 physical assistance, From elevated surface           General transfer comment: 2 bouts of standing performed with rest break between. emphasis and faciliation for anterior weight shifting to facilitate standing. bed height significantly elevated per patient preference    Ambulation/Gait                   Stairs             Wheelchair Mobility    Modified Rankin (Stroke Patients Only)       Balance Overall balance assessment: Needs assistance Sitting-balance support: Feet supported Sitting balance-[name] Scale: Fair     Standing balance support: Bilateral upper extremity supported Standing balance-[name] Scale: Fair Standing balance comment: Min guard for safety in standing. standing tolerance 20 seconds x 2  bouts                            Cognition Arousal/Alertness: Awake/alert Behavior During Therapy: WFL for tasks  assessed/performed Overall Cognitive Status: Within Functional Limits for tasks assessed                                          Exercises Other Exercises Other Exercises: encouraged home exercise program for strengthening    General Comments        Pertinent Vitals/Pain Pain Assessment Pain Assessment: No/denies pain    Home Living                          Prior Function            PT Goals (current goals can now be found in the care plan section) Acute Rehab PT Goals Patient Stated Goal: to be able to stand and walk PT Goal Formulation: With patient Time For Goal Achievement: 05/11/22 Potential to Achieve Goals: Poor Progress towards PT goals: Progressing toward goals    Frequency    Min 2X/week      PT Plan Current plan remains appropriate    Co-evaluation              AM-PAC PT "6 Clicks" Mobility   Outcome Measure  Help needed turning from your back to your side while in a flat bed without using bedrails?: A Lot Help needed moving from lying on your back to sitting on the side of a flat bed without using bedrails?: Total Help needed moving to and from a bed to a chair (including a wheelchair)?: Total Help needed standing up from a chair using your arms (e.g., wheelchair or bedside chair)?: Total Help needed to walk in hospital room?: Total Help needed climbing 3-5 steps with a railing? : Total 6 Click Score: 7    End of Session Equipment Utilized During Treatment: Oxygen Activity Tolerance: Patient tolerated treatment well Patient left: in bed;with call bell/phone within reach   PT Visit Diagnosis: Unsteadiness on feet (R26.81);Muscle weakness (generalized) (M62.81);Difficulty in walking, not elsewhere classified (R26.2)     Time: 8115-7262 PT Time Calculation (min) (ACUTE ONLY): 27 min  Charges:  $Therapeutic Activity: 23-37 mins                     [name], PT, MPT    [name] 05/09/2022,  2:37 PM

## 2022-05-09 NOTE — ED Notes (Addendum)
Pt assisted with the bedpan; male Gary [name] changed.

## 2022-05-09 NOTE — ED Notes (Signed)
This RN checked on pt, and pt stated that he did not have his torsemide this morning. This RN checked the MAR, and the torsemide had been scanned with the rest of his morning meds. This RN told pt that I would confirm with the person caring for them this morning that the medication was given.

## 2022-05-09 NOTE — ED Notes (Signed)
This RN confirmed that the pt did indeed take the torsemide this morning. When this RN updated the pt, the pt was asked if they wanted the guaifenesin (mucinex), which the pt said that was the medication they meant. The medication was retrieved from the pyxis and given to the pt. No other needs expressed at this time. Pt bed in lowest, locked position and call bell is within reach.

## 2022-05-09 NOTE — ED Notes (Signed)
Pt asked to be put on bedpan, and had a bowel movement. With the help of Kiyah, NT, pericare was provided to the pt, and chuck pads were changed. No other needs were voiced at this time.

## 2022-05-09 NOTE — ED Notes (Signed)
The pt was placed on a bed pan. The pt remained on the bed pan for a short period of time. The pt was unable to have a bowel movement.

## 2022-05-09 NOTE — TOC Progression Note (Signed)
Transition of Care [facility]) - Progression Note    Patient Details  Name: Gary [name] MRN: [id number] Date of Birth: Nov 27, 1955  [facility] CM/SW Contact  Shelbie Hutching, RN Phone Number: 05/09/2022, 3:32 PM  Clinical Narrative:    [facility] in [place] has a 100 bed vent/dialysis unit.  RNCM called and left a message for admissions [phone] and faxed referral to [phone].     Expected Discharge Plan: [place] Barriers to Discharge: ED Unsafe disposition  Expected Discharge Plan and Services   Discharge Planning Services: CM Consult Post Acute Care Choice: [place] Living arrangements for the past 2 months: Single Family Home                 DME Arranged: N/A DME Agency: NA                   Social Determinants of Health (SDOH) Interventions SDOH Screenings   Food Insecurity: No Food Insecurity (04/15/2022)  Housing: Low Risk  (04/15/2022)  Transportation Needs: Unmet Transportation Needs (04/15/2022)  Utilities: Not At Risk (04/15/2022)  Recent Concern: Utilities - At Risk (02/05/2022)  Depression (PHQ2-9): Low Risk  (06/07/2021)  Financial Resource Strain: Medium Risk (05/03/2017)  Physical Activity: Insufficiently Active (05/03/2017)  Social Connections: Moderately Integrated (05/03/2017)  Stress: No Stress Concern Present (05/03/2017)  Tobacco Use: Medium Risk (04/26/2022)    Readmission Risk Interventions    12/31/2021    8:58 AM 12/20/2021    4:21 PM 05/04/2021    1:26 PM  Readmission Risk Prevention Plan  Transportation Screening Complete Complete Complete  Medication Review Press photographer) Complete Complete Complete  PCP or Specialist appointment within 3-5 days of discharge Complete Complete Complete  HRI or Home Care Consult Complete Complete Complete  SW Recovery Care/Counseling Consult Complete Complete Complete  Palliative Care Screening Complete Complete Not [place] Not Applicable Not Applicable Not Applicable

## 2022-05-09 NOTE — Progress Notes (Signed)
RT ARRIVED TO ROOM TO ASSIST PT WITH TRACH CARE, PT SLEEPING AT THIS TIME ON T/C AND SPEAKING VALVE IN PLACE. PT DID NOT WAKE WHEN NAME CALLED. WILL CHECK BACK AT LATER TIME TO ASSIST PT. INNER CANNULA, GAUZE AND SX CATH LAYING ON PT'S BEDSIDE TABLE

## 2022-05-10 NOTE — Progress Notes (Signed)
Physical Therapy Treatment Patient Details Name: Gary [name] MRN: [id number] DOB: [birth year] Today's Date: 05/10/2022   History of Present Illness Nickoli Bagheri is a 80yoM who comes to [facility] on 04/26/21 reports his self care needs not being met after prior DC, staffing issues in home and reportedly not able to get his medications. Pt was DC from here 10 days prior after admission for respiratory distress, found to have active influenza infection, also CHF exacerbation. APS now involved in pt's case and he is in process of applying for medicare and obtaining facility placement. 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 At baseline, pt is lift transfers and total care +2, nonambulatory despite extensive time working with rehab in acute setting over several months with goals of maximizing independence in bed mobility and transfers.    PT Comments    Patient is agreeable to PT for therapeutic exercises. Encouraged patient to perform HEP routinely for strengthening to increase independence with functional activity. He wants to attempt standing tomorrow but declined mobility today. PT will continue to follow to maximize independence and facilitate return to prior level of function.    Recommendations for follow up therapy are one component of a multi-disciplinary discharge planning process, led by the attending physician.  Recommendations may be updated based on patient status, additional functional criteria and insurance authorization.  Follow Up Recommendations  Long-term institutional care without follow-up therapy Can patient physically be transported by private vehicle: No   Assistance Recommended at Discharge Intermittent Supervision/Assistance  Patient can return home with the following Two people to help with bathing/dressing/bathroom;Assistance with cooking/housework;Assist for transportation;Help with stairs or ramp for  entrance;Two people to help with walking and/or transfers;Direct supervision/assist for medications management;Direct supervision/assist for financial management   Equipment Recommendations  None recommended by PT    Recommendations for Other Services       Precautions / Restrictions Precautions Precautions: Fall Restrictions Weight Bearing Restrictions: No     Mobility  Bed Mobility               General bed mobility comments: with the bed in Trendelenburg  positiong, patient is able to boost up in the bed using overhead bed rails and LE support without assistance from therapist. further mobility declined by the patient    Transfers                        Ambulation/Gait                   Stairs             Wheelchair Mobility    Modified Rankin (Stroke Patients Only)       Balance                                            Cognition Arousal/Alertness: Awake/alert Behavior During Therapy: WFL for tasks assessed/performed Overall Cognitive Status: Within Functional Limits for tasks assessed                                          Exercises General Exercises - Lower Extremity Ankle Circles/Pumps: AROM, Strengthening, Both, 10 reps, Supine Quad Sets: AROM, Strengthening, Both,  10 reps, Supine Heel Slides: AAROM, Strengthening, Both, 10 reps, Supine Hip ABduction/ADduction: AAROM, Strengthening, Both, 10 reps, Supine Straight Leg Raises: AAROM, Strengthening, Both, 10 reps, Supine Other Exercises Other Exercises: verbal cues for exercise technique for strengthening to increase independence with functional mobility. cues to avoid holding breath with exertion. encouraged patient to perform HEP on his own.    General Comments        Pertinent Vitals/Pain Pain Assessment Pain Assessment: No/denies pain    Home Living                          Prior Function            PT Goals  (current goals can now be found in the care plan section) Acute Rehab PT Goals Patient Stated Goal: to be able to stand and walk PT Goal Formulation: With patient Time For Goal Achievement: 05/11/22 Potential to Achieve Goals: Poor Progress towards PT goals: Progressing toward goals    Frequency    Min 2X/week      PT Plan Current plan remains appropriate    Co-evaluation              AM-PAC PT "6 Clicks" Mobility   Outcome Measure  Help needed turning from your back to your side while in a flat bed without using bedrails?: A Lot Help needed moving from lying on your back to sitting on the side of a flat bed without using bedrails?: Total Help needed moving to and from a bed to a chair (including a wheelchair)?: Total Help needed standing up from a chair using your arms (e.g., wheelchair or bedside chair)?: Total Help needed to walk in hospital room?: Total Help needed climbing 3-5 steps with a railing? : Total 6 Click Score: 7    End of Session Equipment Utilized During Treatment: Oxygen (trach mask oxygen) Activity Tolerance: Patient tolerated treatment well Patient left: in bed;with call bell/phone within reach   PT Visit Diagnosis: Unsteadiness on feet (R26.81);Muscle weakness (generalized) (M62.81);Difficulty in walking, not elsewhere classified (R26.2)     Time: 3875-6433 PT Time Calculation (min) (ACUTE ONLY): 20 min  Charges:  $Therapeutic Exercise: 8-22 mins                     [name], PT, MPT    [name] 05/10/2022, 3:42 PM

## 2022-05-10 NOTE — ED Notes (Signed)
This nurse was able to obtain vitals from patient and gave pt a cup of water and a cup of cranberry juice with no issues.

## 2022-05-10 NOTE — Progress Notes (Signed)
OT Cancellation Note  Patient Details Name: Gary [name] MRN: [id number] DOB: 1956/03/15   Cancelled Treatment:    Reason Eval/Treat Not Completed: Patient declined, no reason specified . OT arrived to room and pt refused stating it was too early to participate. OT will re-attempt when next available.   Gypsy Decant 05/10/2022, 1:44 PM

## 2022-05-10 NOTE — ED Notes (Signed)
Pt given ice water, graham crackers, and peanut butter upon request. No other needs at this time.

## 2022-05-11 NOTE — ED Notes (Signed)
Pt adjusted in bed, NAD noted, pt denies any further needs. Call bell in reach.

## 2022-05-11 NOTE — Progress Notes (Signed)
Occupational Therapy Treatment Patient Details Name: Gary [name] MRN: [id number] DOB: 27-Aug-1955 Today's Date: 05/11/2022   History of present illness Gary [name] is a 19yoM who comes to [facility] on 04/26/21 reports his self care needs not being met after prior DC, staffing issues in home and reportedly not able to get his medications. Pt was DC from here 10 days prior after admission for respiratory distress, found to have active influenza infection, also CHF exacerbation. APS now involved in pt's case and he is in process of applying for medicare and obtaining facility placement. 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 At baseline, pt is lift transfers and total care +2, nonambulatory despite extensive time working with rehab in acute setting over several months with goals of maximizing independence in bed mobility and transfers.   OT comments  Mr Wolbert was seen for OT treatment on this date. Upon arrival to room pt reclined in bed, pleasant t/o and agreeable to tx. Pt requires MOD A exit bed, good sitting balance. L ace wrap applied at pt request 2/2 neuropathy. PT overlapping session to assist with transfers. MAX A x2 + RW sit<>stand x2 from max elevated bed height, poor tolerance ~5 sec each stand. Pt making progress toward goals, will continue to follow POC. Discharge recommendation remains appropriate.     Recommendations for follow up therapy are one component of a multi-disciplinary discharge planning process, led by the attending physician.  Recommendations may be updated based on patient status, additional functional criteria and insurance authorization.    Follow Up Recommendations  Long-term institutional care without follow-up therapy     Assistance Recommended at Discharge Intermittent Supervision/Assistance  Patient can return home with the following  Two people to help with walking and/or transfers;Two  people to help with bathing/dressing/bathroom;Assistance with cooking/housework;Help with stairs or ramp for entrance;Assist for transportation   Equipment Recommendations  None recommended by OT    Recommendations for Other Services      Precautions / Restrictions Precautions Precautions: Fall Restrictions Weight Bearing Restrictions: No       Mobility Bed Mobility Overal bed mobility: Needs Assistance Bed Mobility: Supine to Sit, Sit to Supine     Supine to sit: Mod assist Sit to supine: Max assist   General bed mobility comments: with the bed in Trendelenburg  positiong, patient is able to boost up in the bed using overhead bed rails and LE support without assistance from therapist. further mobility declined by the patient    Transfers Overall transfer level: Needs assistance Equipment used: Rolling walker (2 wheels) (bariatric) Transfers: Sit to/from Stand Sit to Stand: Max assist, +2 physical assistance, From elevated surface           General transfer comment: x2 from max elevated bed height     Balance Overall balance assessment: Needs assistance Sitting-balance support: Feet supported Sitting balance-[name] Scale: Good     Standing balance support: Bilateral upper extremity supported Standing balance-[name] Scale: Fair Standing balance comment: poor tolerance                           ADL either performed or assessed with clinical judgement   ADL Overall ADL's : Needs assistance/impaired  General ADL Comments: MAX A don B shoes seated EOB. SETUP self-drinking      Cognition Arousal/Alertness: Awake/alert Behavior During Therapy: WFL for tasks assessed/performed Overall Cognitive Status: Within Functional Limits for tasks assessed                                 General Comments: pleasant, continues to require cues for sequencing                   Pertinent  Vitals/ Pain       Pain Assessment Pain Assessment: No/denies pain   Frequency  Min 2X/week        Progress Toward Goals  OT Goals(current goals can now be found in the care plan section)  Progress towards OT goals: Progressing toward goals  Acute Rehab OT Goals Patient Stated Goal: to walk OT Goal Formulation: With patient Time For Goal Achievement: 05/30/22 Potential to Achieve Goals: Fair ADL Goals Pt Will Perform Grooming: with modified independence;sitting Pt Will Perform Lower Body Dressing: with min assist;sitting/lateral leans Pt Will Transfer to Toilet: with min assist;ambulating;bedside commode;stand pivot transfer Pt/caregiver will Perform Home Exercise Program: Increased strength;Both right and left upper extremity;With theraband;With written HEP provided;Independently  Plan Discharge plan remains appropriate;Frequency remains appropriate    Co-evaluation                 AM-PAC OT "6 Clicks" Daily Activity     Outcome Measure   Help from another person eating meals?: None Help from another person taking care of personal grooming?: A Little Help from another person toileting, which includes using toliet, bedpan, or urinal?: Total Help from another person bathing (including washing, rinsing, drying)?: A Lot Help from another person to put on and taking off regular upper body clothing?: A Little Help from another person to put on and taking off regular lower body clothing?: A Lot 6 Click Score: 15    End of Session    OT Visit Diagnosis: Other abnormalities of gait and mobility (R26.89);Muscle weakness (generalized) (M62.81)   Activity Tolerance Patient tolerated treatment well   Patient Left in bed;with call bell/phone within reach   Nurse Communication          Time: 2992-4268 OT Time Calculation (min): 36 min  Charges: OT General Charges $OT Visit: 1 Visit OT Treatments $Self Care/Home Management : 8-22 mins $Therapeutic Activity: 8-22  mins  [name], M.S. OTR/L  05/11/22, 2:20 PM  ascom [phone]

## 2022-05-11 NOTE — ED Notes (Signed)
Pt placed on bed pan, pt cleaned up, new chux applied. No further needs at this time. Call bell in reach.

## 2022-05-11 NOTE — Progress Notes (Signed)
Physical Therapy Treatment Patient Details Name: KETRICK MATNEY MRN: [id number] DOB: 07-02-1955 Today's Date: 05/11/2022   History of Present Illness Windell Musson is a 89yoM who comes to [facility] on 04/26/21 reports his self care needs not being met after prior DC, staffing issues in home and reportedly not able to get his medications. Pt was DC from here 10 days prior after admission for respiratory distress, found to have active influenza infection, also CHF exacerbation. APS now involved in pt's case and he is in process of applying for medicare and obtaining facility placement. 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 At baseline, pt is lift transfers and total care +2, nonambulatory despite extensive time working with rehab in acute setting over several months with goals of maximizing independence in bed mobility and transfers.   PT Comments    Patient is agreeable to PT. He was seated on the edge of bed with OT present on arrival to room. He is motivated to try standing today. Education provided for anterior weight shifting, LE positioning, using lower body strength to facilitate lift off. Patient has difficulty with carry over of instructions for anterior weight shifting during the transfer. 2 standing bouts performed from bed with +2 person assistance with standing tolerance of 5-10 seconds. Recommend to continue PT in the hospital to maximize independence and decrease caregiver burden.    Recommendations for follow up therapy are one component of a multi-disciplinary discharge planning process, led by the attending physician.  Recommendations may be updated based on patient status, additional functional criteria and insurance authorization.  Follow Up Recommendations  Long-term institutional care without follow-up therapy Can patient physically be transported by private vehicle: No   Assistance Recommended at Discharge  Intermittent Supervision/Assistance  Patient can return home with the following Two people to help with bathing/dressing/bathroom;Assistance with cooking/housework;Assist for transportation;Help with stairs or ramp for entrance;Two people to help with walking and/or transfers;Direct supervision/assist for medications management;Direct supervision/assist for financial management   Equipment Recommendations  None recommended by PT    Recommendations for Other Services       Precautions / Restrictions Precautions Precautions: Fall Restrictions Weight Bearing Restrictions: No     Mobility  Bed Mobility Overal bed mobility: Needs Assistance         Sit to supine: Max assist   General bed mobility comments: assistance for LE support and cues for technique. patient uses trendelenburg  positiong and is able to boost up in the bed using overhead bed rails and LE support without assistance from therapist.    Transfers Overall transfer level: Needs assistance Equipment used:  (bariatric rolling walker) Transfers: Sit to/from Stand Sit to Stand: Max assist, +2 physical assistance, From elevated surface           General transfer comment: maximal cues for anterior weight shifting and using legs for support to stand. patient relying heavily on upper body strength to faciliate standing and has a hard time with carry over of anterior weight shifting during the transfer. 2 bouts of standing performed with rest break between bouts of standing    Ambulation/Gait               General Gait Details: unable to at this time   Stairs             Wheelchair Mobility    Modified Rankin (Stroke Patients Only)       Balance Overall balance  assessment: Needs assistance Sitting-balance support: Feet supported Sitting balance-[name] Scale: Good     Standing balance support: Bilateral upper extremity supported Standing balance-[name] Scale: Poor Standing balance comment:  standing tolerance of only 5-10 seconds today. heavy reliance on rolling walker for support to maintain standing and intermittent reliance on bed for posterior leg support.                            Cognition Arousal/Alertness: Awake/alert Behavior During Therapy: WFL for tasks assessed/performed Overall Cognitive Status: Within Functional Limits for tasks assessed                                          Exercises General Exercises - Lower Extremity Short Arc Quad: AAROM, Strengthening, Both, 10 reps (semi-chair position in bed) Other Exercises Other Exercises: encouraged HEP over the weekend for increased readiness to stand    General Comments        Pertinent Vitals/Pain Pain Assessment Pain Assessment: No/denies pain    Home Living                          Prior Function            PT Goals (current goals can now be found in the care plan section) Acute Rehab PT Goals Patient Stated Goal: to be able to stand and walk PT Goal Formulation: With patient Time For Goal Achievement: 05/25/22 Potential to Achieve Goals: Poor Progress towards PT goals: Progressing toward goals    Frequency    Min 2X/week      PT Plan Current plan remains appropriate    Co-evaluation PT/OT/SLP Co-Evaluation/Treatment: Yes (partial co-treat) Reason for Co-Treatment: To address functional/ADL transfers          AM-PAC PT "6 Clicks" Mobility   Outcome Measure  Help needed turning from your back to your side while in a flat bed without using bedrails?: A Lot Help needed moving from lying on your back to sitting on the side of a flat bed without using bedrails?: Total Help needed moving to and from a bed to a chair (including a wheelchair)?: Total Help needed standing up from a chair using your arms (e.g., wheelchair or bedside chair)?: Total Help needed to walk in hospital room?: Total Help needed climbing 3-5 steps with a railing? :  Total 6 Click Score: 7    End of Session Equipment Utilized During Treatment: Oxygen (trach mask) Activity Tolerance: Patient tolerated treatment well Patient left: in bed;with call bell/phone within reach   PT Visit Diagnosis: Unsteadiness on feet (R26.81);Muscle weakness (generalized) (M62.81);Difficulty in walking, not elsewhere classified (R26.2)     Time: 8299-3716 PT Time Calculation (min) (ACUTE ONLY): 27 min  Charges:  $Therapeutic Activity: 8-22 mins                     [name], PT, MPT    [name] 05/11/2022, 2:41 PM

## 2022-05-11 NOTE — ED Notes (Signed)
Pt being rude to EVS, pt is telling EVS that she is missing spots on the floor when mopping

## 2022-05-11 NOTE — ED Provider Notes (Signed)
-----------------------------------------   6:22 AM on 05/11/2022 -----------------------------------------   Blood pressure 118/76, pulse 74, temperature 98 F (36.7 C), temperature source Oral, resp. rate 18, height '6\' 3"'$  (1.905 m), weight (!) 177.4 kg, SpO2 95 %.  The patient is calm and cooperative at this time.  There have been no acute events since the last update.  Awaiting disposition plan from Social Work team.   Paulette Blanch, MD 05/11/22 [phone]

## 2022-05-11 NOTE — ED Notes (Signed)
Pt given new suction tubing and oral suction per request.  Pt refusing vitals at this time.

## 2022-05-11 NOTE — TOC Progression Note (Signed)
Transition of Care [facility]) - Progression Note    Patient Details  Name: Gary [name] MRN: [id number] Date of Birth: 1955-11-16  [facility] of [facility] CM/SW Contact  Shelbie Hutching, RN Phone Number: 05/11/2022, 4:00 PM  Clinical Narrative:    RNCM did receive a call from [place] in [place], they were asking for some additional information about the patient.  They left a message and when I called them back I got VM and left them a message to call me back again.   They need to make sure that the family is in agreement with the patient going so far away.     Expected Discharge Plan: [place] Barriers to Discharge: ED Unsafe disposition  Expected Discharge Plan and Services   Discharge Planning Services: CM Consult Post Acute Care Choice: [place] Living arrangements for the past 2 months: Single Family Home                 DME Arranged: N/A DME Agency: NA                   Social Determinants of Health (SDOH) Interventions SDOH Screenings   Food Insecurity: No Food Insecurity (04/15/2022)  Housing: Low Risk  (04/15/2022)  Transportation Needs: Unmet Transportation Needs (04/15/2022)  Utilities: Not At Risk (04/15/2022)  Recent Concern: Utilities - At Risk (02/05/2022)  Depression (PHQ2-9): Low Risk  (06/07/2021)  Financial Resource Strain: Medium Risk (05/03/2017)  Physical Activity: Insufficiently Active (05/03/2017)  Social Connections: Moderately Integrated (05/03/2017)  Stress: No Stress Concern Present (05/03/2017)  Tobacco Use: Medium Risk (04/26/2022)    Readmission Risk Interventions    12/31/2021    8:58 AM 12/20/2021    4:21 PM 05/04/2021    1:26 PM  Readmission Risk Prevention Plan  Transportation Screening Complete Complete Complete  Medication Review Press photographer) Complete Complete Complete  PCP or Specialist appointment within 3-5 days of discharge Complete Complete Complete  HRI or Home Care Consult  Complete Complete Complete  SW Recovery Care/Counseling Consult Complete Complete Complete  Palliative Care Screening Complete Complete Not [place] Not Applicable Not Applicable Not Applicable

## 2022-05-11 NOTE — Progress Notes (Signed)
Patient has been placed on home vent with 2 liter bleedin for the night. Patient had performed trach maintenance prior to my arrival. Requested no suctioning. No distress noted. Will continue to monitor

## 2022-05-11 NOTE — ED Notes (Signed)
Pt given gauze for trach, no other needs at this time. Call bell in reach.

## 2022-05-12 NOTE — ED Notes (Addendum)
Pt placed on bedpan for BM. Pt was not able to go at this time. Pt cleaned and chucks replaced. Pt stated "I'll try again later."

## 2022-05-12 NOTE — ED Provider Notes (Signed)
-----------------------------------------   5:52 AM on 05/12/2022 -----------------------------------------   Blood pressure 118/76, pulse 74, temperature 98 F (36.7 C), temperature source Oral, resp. rate 18, height 1.905 m ('6\' 3"'$ ), weight (!) 177.4 kg, SpO2 96 %.  The patient is calm and cooperative at this time.  There have been no acute events since the last update.  Awaiting disposition plan from [facility] team.   Hinda Kehr, MD 05/12/22 [phone]

## 2022-05-12 NOTE — Progress Notes (Signed)
Patient now off home vent back on 28% trach collar in no distress.

## 2022-05-13 NOTE — ED Notes (Signed)
Sandwich box given to pt per pt request.

## 2022-05-13 NOTE — ED Provider Notes (Signed)
Emergency Medicine Observation Re-evaluation Note  Gary [name] is a 67 y.o. male, currently boarding in the emergency department waiting for an appropriate living facility.  No acute events since last update.  Physical Exam  BP 115/83   Pulse 90   Temp 98.2 F (36.8 C) (Oral)   Resp 20   Ht '6\' 3"'$  (1.905 m)   Wt (!) 177.4 kg   SpO2 93%   BMI 48.87 kg/m    ED Course / MDM   No recent lab work for review  Plan  Current plan is for placement to an appropriate living facility once available.  Social work is currently working with the patient.    Harvest Dark, MD 05/13/22 2003

## 2022-05-13 NOTE — Progress Notes (Signed)
Placed patient on his home vent with 2 liters o2 for nighttime use. Patient c/o irritation around trach site. Site noted to be intact. No swelling or redness noted. I tightened up the trach ties to minimize trach moving around in airway. BBS diminished. Patient states he suctioned himself and changed his supplies earlier. No distress noted. Will continue to monitor.

## 2022-05-13 NOTE — Progress Notes (Signed)
Patient now off home vent back on 28% trach collar in no distress.

## 2022-05-14 NOTE — Progress Notes (Signed)
Occupational Therapy Treatment Patient Details Name: Gary [name] MRN: [id number] DOB: May 11, 1955 Today's Date: 05/14/2022   History of present illness Dwyne Hasegawa is a 60yoM who comes to [facility] on 04/26/21 reports his self care needs not being met after prior DC, staffing issues in home and reportedly not able to get his medications. Pt was DC from here 10 days prior after admission for respiratory distress, found to have active influenza infection, also CHF exacerbation. APS now involved in pt's case and he is in process of applying for medicare and obtaining facility placement. 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 At baseline, pt is lift transfers and total care +2, nonambulatory despite extensive time working with rehab in acute setting over several months with goals of maximizing independence in bed mobility and transfers.   OT comments  Mr Dobrowski was seen for OT/PT co-treatment on this date. Upon arrival to room pt reclined in bed, agreeable to tx. Pt requires MAX A pericare bed level. MAX A don/doff B shoes seated. Attempted standing however pt cites weakness, completed lateral scoot along EOB with MOD A x2. Pt making good progress toward goals, will continue to follow POC. Discharge recommendation remains appropriate.     Recommendations for follow up therapy are one component of a multi-disciplinary discharge planning process, led by the attending physician.  Recommendations may be updated based on patient status, additional functional criteria and insurance authorization.    Follow Up Recommendations  Long-term institutional care without follow-up therapy     Assistance Recommended at Discharge Intermittent Supervision/Assistance  Patient can return home with the following  Two people to help with walking and/or transfers;Two people to help with bathing/dressing/bathroom;Assistance with cooking/housework;Help  with stairs or ramp for entrance;Assist for transportation   Equipment Recommendations  None recommended by OT    Recommendations for Other Services      Precautions / Restrictions Precautions Precautions: Fall Restrictions Weight Bearing Restrictions: No       Mobility Bed Mobility Overal bed mobility: Needs Assistance Bed Mobility: Supine to Sit, Sit to Supine Rolling: Mod assist   Supine to sit: Mod assist, +2 for physical assistance Sit to supine: Max assist   General bed mobility comments: verbal cues for technique. increased time and effort required with bed mobility    Transfers Overall transfer level: Needs assistance                 General transfer comment: attempted sit to stand transfer several times but patient providing poor effort and feels too weak to attempt standing. patient is able to perform lateral scoot transfer to the right with Mod  A +2     Balance Overall balance assessment: Needs assistance Sitting-balance support: Feet supported Sitting balance-[name] Scale: Good                                     ADL either performed or assessed with clinical judgement   ADL Overall ADL's : Needs assistance/impaired                                       General ADL Comments: MAX A pericare bed level. MAX A don/doff B shoes seated EOB      Cognition Arousal/Alertness: Awake/alert Behavior During Therapy:  WFL for tasks assessed/performed Overall Cognitive Status: Within Functional Limits for tasks assessed                                           Pertinent Vitals/ Pain       Pain Assessment Pain Assessment: No/denies pain   Frequency  Min 2X/week        Progress Toward Goals  OT Goals(current goals can now be found in the care plan section)  Progress towards OT goals: Progressing toward goals  Acute Rehab OT Goals Patient Stated Goal: to stand OT Goal Formulation: With  patient Time For Goal Achievement: 05/30/22 Potential to Achieve Goals: Fair ADL Goals Pt Will Perform Grooming: with modified independence;sitting Pt Will Perform Lower Body Dressing: with min assist;sitting/lateral leans Pt Will Transfer to Toilet: with min assist;ambulating;bedside commode;stand pivot transfer Pt/caregiver will Perform Home Exercise Program: Increased strength;Both right and left upper extremity;With theraband;With written HEP provided;Independently  Plan Discharge plan remains appropriate;Frequency remains appropriate    Co-evaluation    PT/OT/SLP Co-Evaluation/Treatment: Yes Reason for Co-Treatment: For patient/therapist safety;To address functional/ADL transfers PT goals addressed during session: Mobility/safety with mobility OT goals addressed during session: ADL's and self-care      AM-PAC OT "6 Clicks" Daily Activity     Outcome Measure   Help from another person eating meals?: None Help from another person taking care of personal grooming?: A Little Help from another person toileting, which includes using toliet, bedpan, or urinal?: Total Help from another person bathing (including washing, rinsing, drying)?: A Lot Help from another person to put on and taking off regular upper body clothing?: A Little Help from another person to put on and taking off regular lower body clothing?: A Lot 6 Click Score: 15    End of Session    OT Visit Diagnosis: Other abnormalities of gait and mobility (R26.89);Muscle weakness (generalized) (M62.81)   Activity Tolerance Patient tolerated treatment well   Patient Left in bed;with call bell/phone within reach   Nurse Communication          Time: 5520-8022 OT Time Calculation (min): 33 min  Charges: OT General Charges $OT Visit: 1 Visit OT Treatments $Self Care/Home Management : 8-22 mins  [name], M.S. OTR/L  05/14/22, 2:05 PM  ascom [phone]

## 2022-05-14 NOTE — Progress Notes (Signed)
Placed patient back on trach collar 28%. Patient c/o pain around trach site earlier prior to going on him home vent for bedtime. Trach site in tact. Minimal drainage noted. Area dried. No redness or swelling. Placed drawtex wound dressing under trach and sponge gauze on top. Secretions are sticking to the underside of gauze and crusting over. Such may be the cause of his pain when sponge gauze is removed. Advised him to continue to keep trach ties tight and secure and he like ties loose most days. Patient appreciative of efforts. Will continue to monitor.

## 2022-05-14 NOTE — Progress Notes (Signed)
Physical Therapy Treatment Patient Details Name: Gary [name] MRN: [id number] DOB: [birth year] Today's Date: 05/14/2022   History of Present Illness Early Steel is a 36yoM who comes to [facility] on 04/26/21 reports his self care needs not being met after prior DC, staffing issues in home and reportedly not able to get his medications. Pt was DC from here 10 days prior after admission for respiratory distress, found to have active influenza infection, also CHF exacerbation. APS now involved in pt's case and he is in process of applying for medicare and obtaining facility placement. 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 At baseline, pt is lift transfers and total care +2, nonambulatory despite extensive time working with rehab in acute setting over several months with goals of maximizing independence in bed mobility and transfers.    PT Comments    Patient is agreeable to PT. He continues to need assistance with bed mobility and rest breaks with activity secondary to fatigue. He was unable to stand today due with poor effort with initiation despite cues for technique and +2 person assistance with several bouts of standing attempted. Cues for anterior weight shifting and increased initiation of LE strength to facilitate standing. He reports feeling generalized weakness today. Recommend to continue PT to maximize independence and decrease caregiver burden.    Recommendations for follow up therapy are one component of a multi-disciplinary discharge planning process, led by the attending physician.  Recommendations may be updated based on patient status, additional functional criteria and insurance authorization.  Follow Up Recommendations  Long-term institutional care without follow-up therapy Can patient physically be transported by private vehicle: No   Assistance Recommended at Discharge Intermittent Supervision/Assistance   Patient can return home with the following Two people to help with bathing/dressing/bathroom;Assistance with cooking/housework;Assist for transportation;Help with stairs or ramp for entrance;Two people to help with walking and/or transfers;Direct supervision/assist for medications management;Direct supervision/assist for financial management   Equipment Recommendations  None recommended by PT    Recommendations for Other Services       Precautions / Restrictions Precautions Precautions: Fall Restrictions Weight Bearing Restrictions: No     Mobility  Bed Mobility Overal bed mobility: Needs Assistance Bed Mobility: Supine to Sit, Sit to Supine Rolling: Mod assist   Supine to sit: Mod assist, +2 for physical assistance Sit to supine: Max assist   General bed mobility comments: verbal cues for technique. increased time and effort required with bed mobility    Transfers Overall transfer level: Needs assistance                 General transfer comment: attempted sit to stand transfer several times but patient providing poor effort and feels too weak to attempt standing. patient is able to perform lateral scoot transfer to the right with Mod  A +2 with the bed in mild trendelenberg position    Ambulation/Gait                   Stairs             Wheelchair Mobility    Modified Rankin (Stroke Patients Only)       Balance Overall balance assessment: Needs assistance Sitting-balance support: Feet supported Sitting balance-[name] Scale: Good  Cognition Arousal/Alertness: Awake/alert Behavior During Therapy: WFL for tasks assessed/performed Overall Cognitive Status: Within Functional Limits for tasks assessed                                          Exercises Other Exercises Other Exercises: encouraged HEP. patient needs to be getting to the chair daily with the mechanical lift  for upright conditioning and in preparation for transfer training. would also recommend a bariatric bed for comfort    General Comments        Pertinent Vitals/Pain Pain Assessment Pain Assessment: No/denies pain    Home Living                          Prior Function            PT Goals (current goals can now be found in the care plan section) Acute Rehab PT Goals Patient Stated Goal: to be able to stand and walk PT Goal Formulation: With patient Time For Goal Achievement: 05/25/22 Potential to Achieve Goals: Poor Progress towards PT goals: Progressing toward goals    Frequency    Min 2X/week      PT Plan Current plan remains appropriate    Co-evaluation PT/OT/SLP Co-Evaluation/Treatment: Yes Reason for Co-Treatment: For patient/therapist safety;To address functional/ADL transfers PT goals addressed during session: Mobility/safety with mobility OT goals addressed during session: ADL's and self-care      AM-PAC PT "6 Clicks" Mobility   Outcome Measure  Help needed turning from your back to your side while in a flat bed without using bedrails?: A Lot Help needed moving from lying on your back to sitting on the side of a flat bed without using bedrails?: Total Help needed moving to and from a bed to a chair (including a wheelchair)?: Total Help needed standing up from a chair using your arms (e.g., wheelchair or bedside chair)?: Total Help needed to walk in hospital room?: Total Help needed climbing 3-5 steps with a railing? : Total 6 Click Score: 7    End of Session Equipment Utilized During Treatment: Oxygen (trach mask) Activity Tolerance: Patient tolerated treatment well Patient left: in bed;with call bell/phone within reach   PT Visit Diagnosis: Unsteadiness on feet (R26.81);Muscle weakness (generalized) (M62.81);Difficulty in walking, not elsewhere classified (R26.2)     Time: 7124-5809 PT Time Calculation (min) (ACUTE ONLY): 30  min  Charges:  $Therapeutic Activity: 8-22 mins                     [name], PT, MPT    [name] 05/14/2022, 2:07 PM

## 2022-05-14 NOTE — ED Notes (Signed)
Patient given cup of ice water. 

## 2022-05-14 NOTE — TOC Progression Note (Signed)
Transition of Care [facility]) - Progression Note    Patient Details  Name: Gary [name] MRN: [id number] Date of Birth: [birth year]  Transition of Care [facility]) CM/SW Contact  Shelbie Hutching, RN Phone Number: 05/14/2022, 3:34 PM  Clinical Narrative:    Still waiting to hear back from [facility] in [place].  Will try calling them again this afternoon.  Patient has been telling the therapists that he does not want to go to [place] that it is too far away.  If facility can accept and patient refuses will involve TOC leadership.    Expected Discharge Plan: [place] Barriers to Discharge: ED Unsafe disposition  Expected Discharge Plan and Services   Discharge Planning Services: CM Consult Post Acute Care Choice: [place] Living arrangements for the past 2 months: Single Family Home                 DME Arranged: N/A DME Agency: NA                   Social Determinants of Health (SDOH) Interventions SDOH Screenings   Food Insecurity: No Food Insecurity (04/15/2022)  Housing: Low Risk  (04/15/2022)  Transportation Needs: Unmet Transportation Needs (04/15/2022)  Utilities: Not At Risk (04/15/2022)  Recent Concern: Utilities - At Risk (02/05/2022)  Depression (PHQ2-9): Low Risk  (06/07/2021)  Financial Resource Strain: Medium Risk (05/03/2017)  Physical Activity: Insufficiently Active (05/03/2017)  Social Connections: Moderately Integrated (05/03/2017)  Stress: No Stress Concern Present (05/03/2017)  Tobacco Use: Medium Risk (04/26/2022)    Readmission Risk Interventions    12/31/2021    8:58 AM 12/20/2021    4:21 PM 05/04/2021    1:26 PM  Readmission Risk Prevention Plan  Transportation Screening Complete Complete Complete  Medication Review Press photographer) Complete Complete Complete  PCP or Specialist appointment within 3-5 days of discharge Complete Complete Complete  HRI or Home Care Consult Complete Complete Complete  SW Recovery  Care/Counseling Consult Complete Complete Complete  Palliative Care Screening Complete Complete Not [place] Not Applicable Not Applicable Not Applicable

## 2022-05-14 NOTE — ED Notes (Signed)
Meal given

## 2022-05-14 NOTE — ED Provider Notes (Signed)
-----------------------------------------   5:00 AM on 05/14/2022 -----------------------------------------   Blood pressure 115/83, pulse 79, temperature 98.2 F (36.8 C), temperature source Oral, resp. rate 20, height 1.905 m ('6\' 3"'$ ), weight (!) 177.4 kg, SpO2 97 %.  The patient is calm and cooperative at this time.  There have been no acute events since the last update.  Awaiting disposition plan from [facility] team.   Hinda Kehr, MD 05/14/22 0500

## 2022-05-15 NOTE — ED Notes (Signed)
Pericare provided to patient, pt cleansed with bath wipes. No skin breakdown noted. New purewick placed

## 2022-05-15 NOTE — ED Notes (Signed)
Patient clean and dry. Repositioned for comfort.

## 2022-05-15 NOTE — Progress Notes (Signed)
Physical Therapy Treatment Patient Details Name: Gary [name] MRN: [id number] DOB: 05/21/1955 Today's Date: 05/15/2022   History of Present Illness Gary [name] is a 49yoM who comes to [facility] on 04/26[facility]21 reports his self care needs not being met after prior DC, staffing issues in home and reportedly not able to get his medications. Pt was DC from here 10 days prior after admission for respiratory distress, found to have active influenza infection, also CHF exacerbation. APS now involved in pt's case and he is in process of applying for medicare and obtaining facility placement. 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 At baseline, pt is lift transfers and total care +2, nonambulatory despite extensive time working with rehab in acute setting over several months with goals of maximizing independence in bed mobility and transfers.   PT Comments    Patient agreeable to PT. He requested to perform LE exercises for strengthening which were done with cues for technique and assistance as needed to facilitate independence with transfers and for improved standing tolerance. Patient is agreeable to try standing during the next session. Recommend to continue PT to maximize independence and decrease caregiver burden.    Recommendations for follow up therapy are one component of a multi-disciplinary discharge planning process, led by the attending physician.  Recommendations may be updated based on patient status, additional functional criteria and insurance authorization.  Follow Up Recommendations  Long-term institutional care without follow-up therapy Can patient physically be transported by private vehicle: No   Assistance Recommended at Discharge Intermittent Supervision/Assistance  Patient can return home with the following Two people to help with bathing/dressing/bathroom;Assistance with cooking/housework;Assist for  transportation;Help with stairs or ramp for entrance;Two people to help with walking and/or transfers;Direct supervision/assist for medications management;Direct supervision/assist for financial management   Equipment Recommendations  None recommended by PT    Recommendations for Other Services       Precautions / Restrictions Precautions Precautions: Fall Restrictions Weight Bearing Restrictions: No     Mobility  Bed Mobility               General bed mobility comments: with the bed in Trendelenburg positiong, patient is able to boost up in the bed using overhead bed rails and LE support without assistance from therapist. further mobility declined by the patient with request to perform in bed exercises    Transfers                        Ambulation/Gait                   Stairs             Wheelchair Mobility    Modified Rankin (Stroke Patients Only)       Balance                                            Cognition Arousal/Alertness: Awake/alert Behavior During Therapy: WFL for tasks assessed/performed Overall Cognitive Status: Within Functional Limits for tasks assessed                                          Exercises General Exercises - Lower Extremity Quad Sets:  AROM, Strengthening, Both, 10 reps, Supine Short Arc Quad: AAROM, Strengthening, Both, 10 reps Heel Slides: AROM, AAROM, Strengthening, Both, 10 reps, Supine (2 sets of 10 on each leg) Straight Leg Raises: AROM, Strengthening, Both, 10 reps, Supine Other Exercises Other Exercises: encouraged HEP. patient needs to be getting to the chair daily with the mechanical lift for upright conditioning and in preparation for transfer training. would also recommend a bariatric bed for comfort    General Comments        Pertinent Vitals/Pain Pain Assessment Pain Assessment: No/denies pain    Home Living                           Prior Function            PT Goals (current goals can now be found in the care plan section) Acute Rehab PT Goals Patient Stated Goal: to be able to stand and walk PT Goal Formulation: With patient Time For Goal Achievement: 05/25/22 Potential to Achieve Goals: Poor Progress towards PT goals: Progressing toward goals    Frequency    Min 2X/week      PT Plan Current plan remains appropriate    Co-evaluation              AM-PAC PT "6 Clicks" Mobility   Outcome Measure  Help needed turning from your back to your side while in a flat bed without using bedrails?: A Lot Help needed moving from lying on your back to sitting on the side of a flat bed without using bedrails?: Total Help needed moving to and from a bed to a chair (including a wheelchair)?: Total Help needed standing up from a chair using your arms (e.g., wheelchair or bedside chair)?: Total Help needed to walk in hospital room?: Total Help needed climbing 3-5 steps with a railing? : Total 6 Click Score: 7    End of Session Equipment Utilized During Treatment: Oxygen (trach mask) Activity Tolerance: Patient limited by fatigue Patient left: in bed;with call bell/phone within reach   PT Visit Diagnosis: Unsteadiness on feet (R26.81);Muscle weakness (generalized) (M62.81);Difficulty in walking, not elsewhere classified (R26.2)     Time: 8366-2947 PT Time Calculation (min) (ACUTE ONLY): 25 min  Charges:  $Therapeutic Exercise: 23-37 mins                     [name], PT, MPT    [name] 05/15/2022, 2:35 PM

## 2022-05-15 NOTE — ED Notes (Signed)
Pt placed on bariatric bed. Pericare performed. New mepilex placed, new linens provided. Again, no skin breakdown noted.

## 2022-05-15 NOTE — Progress Notes (Signed)
Patient assessed for Trach care assistance and trilogy vent. Patient stated that [place] care was already done by self. Patient was ready for HS, home trilogy unit was turned on and placed closer to the bedside, patient places self on home trilogy with 4L O2 bleed. SpO2 97%.

## 2022-05-15 NOTE — ED Provider Notes (Signed)
-----------------------------------------   5:44 AM on 05/15/2022 -----------------------------------------   Blood pressure 120/84, pulse 77, temperature 98 F (36.7 C), resp. rate 20, height '6\' 3"'$  (1.905 m), weight (!) 177.4 kg, SpO2 93 %.  The patient is calm and cooperative at this time.  There have been no acute events since the last update.  Awaiting disposition plan from Social Work team.   Paulette Blanch, MD 05/15/22 [phone]

## 2022-05-15 NOTE — TOC Progression Note (Signed)
Transition of Care [facility]) - Progression Note    Patient Details  Name: Gary [name] MRN: [id number] Date of Birth: March 21, 1956  Transition of Care [facility]) CM/SW Contact  Shelbie Hutching, RN Phone Number: 05/15/2022, 9:17 AM  Clinical Narrative:    Called and left a message for [facility] Admissions again to return call.  RNCM also reached out to [facility] in Admissions at [facility] in [place], left a message for return call to see if they have any vent beds available.     Expected Discharge Plan: [place] Barriers to Discharge: ED Unsafe disposition  Expected Discharge Plan and Services   Discharge Planning Services: CM Consult Post Acute Care Choice: [place] Living arrangements for the past 2 months: Single Family Home                 DME Arranged: N/A DME Agency: NA                   Social Determinants of Health (SDOH) Interventions SDOH Screenings   Food Insecurity: No Food Insecurity (04/15/2022)  Housing: Low Risk  (04/15/2022)  Transportation Needs: Unmet Transportation Needs (04/15/2022)  Utilities: Not At Risk (04/15/2022)  Recent Concern: Utilities - At Risk (02/05/2022)  Depression (PHQ2-9): Low Risk  (06/07/2021)  Financial Resource Strain: Medium Risk (05/03/2017)  Physical Activity: Insufficiently Active (05/03/2017)  Social Connections: Moderately Integrated (05/03/2017)  Stress: No Stress Concern Present (05/03/2017)  Tobacco Use: Medium Risk (04/26/2022)    Readmission Risk Interventions    12/31/2021    8:58 AM 12/20/2021    4:21 PM 05/04/2021    1:26 PM  Readmission Risk Prevention Plan  Transportation Screening Complete Complete Complete  Medication Review Press photographer) Complete Complete Complete  PCP or Specialist appointment within 3-5 days of discharge Complete Complete Complete  HRI or Home Care Consult Complete Complete Complete  SW Recovery Care/Counseling Consult Complete  Complete Complete  Palliative Care Screening Complete Complete Not [place] Not Applicable Not Applicable Not Applicable

## 2022-05-15 NOTE — TOC Initial Note (Signed)
Transition of Care [facility]) - Initial/Assessment Note    Patient Details  Name: Gary [name] MRN: [id number] Date of Birth: [birth year]  Transition of Care [facility]) CM/SW Contact:    Shelbie Hutching, RN Phone Number: 05/15/2022, 3:10 PM  Clinical Narrative:                 Received a call back from [facility], they report that their weight limit is also 350 pounds but that if the patient is tall they can sometimes accept.  They asked for a waist circumference but patient refused as he said [place] was too far. Did talk with him about [place], he says that is too far too but then he mentioned that he had family up there.  He reports working really hard with therapy and is working to get to walking.   RNCM will reach back out to [facility] in [place].  [facility] facility in [place] called me back and they no longer have a vent unit.    Expected Discharge Plan: Skilled Nursing Facility Barriers to Discharge: ED Unsafe disposition   Patient Goals and CMS Choice Patient states their goals for this hospitalization and ongoing recovery are:: patient cannot take care of himself wants placement CMS Medicare.gov Compare Post Acute Care list provided to:: Patient Choice offered to / list presented to : Patient      Expected Discharge Plan and Services   Discharge Planning Services: CM Consult Post Acute Care Choice: [place] Living arrangements for the past 2 months: Single Family Home                 DME Arranged: N/A DME Agency: NA                  Prior Living Arrangements/Services Living arrangements for the past 2 months: Single Family Home Lives with:: Significant Other Patient language and need for interpreter reviewed:: Yes Do you feel safe going back to the place where you live?: No   cant take care of himself and girlfriend left  Need for Family Participation in Patient Care: Yes (Comment) Care giver support system in place?: No (comment) Current  home services: DME (trilogy, oxygen, hoyer lift, hospital bed, wheelchair, walker) Criminal Activity/Legal Involvement Pertinent to Current Situation/Hospitalization: No - Comment as needed  Activities of Daily Living      Permission Sought/Granted                  Emotional Assessment Appearance:: Appears stated age Attitude/Demeanor/Rapport: Engaged, Complaining   Orientation: : Oriented to Self, Oriented to Place, Oriented to  Time, Oriented to Situation Alcohol / Substance Use: Not Applicable Psych Involvement: No (comment)  Admission diagnosis:  abd pain, ems Patient Active Problem List   Diagnosis Date Noted   Acute exacerbation of CHF (congestive heart failure) ([place]) 04/11/2022   Influenza A 04/11/2022   Hypokalemia 03/06/2022   Acute on chronic diastolic (congestive) heart failure ([place]) 02/03/2022   Chronic obstructive pulmonary disease (COPD) ([place]) 02/03/2022   GERD without esophagitis 02/03/2022   Anxiety and depression 02/03/2022   Dyslipidemia 02/03/2022   Bloody stool 11/12/2021   Major depressive disorder, recurrent episode, moderate ([place]) 10/22/2021   Pressure injury of skin 09/27/2021   Acute on chronic respiratory failure with hypercapnia ([place]) 08/07/2021   COPD (chronic obstructive pulmonary disease) ([place])    HLD (hyperlipidemia)    Iron deficiency anemia    Chronic kidney disease, stage 3a ([place]) 07/22/2021   Acute  on chronic respiratory failure with hypoxia and hypercapnia ([name]) 07/20/2021   Chronic obstructive pulmonary disease ([name])    Acute CHF (congestive heart failure) ([place]) 04/04/2021   Weakness    Acute respiratory failure with hypoxia and hypercapnia ([name]) 03/11/2021   (HFpEF) heart failure with preserved ejection fraction ([name])    Chronic respiratory failure with hypoxia ([name])    Hyperlipidemia 02/19/2021   Acute respiratory distress syndrome (ARDS) due to COVID-19 virus ([place])    Acute on chronic respiratory failure ([place]) 02/18/2021    Generalized osteoarthritis of multiple sites 10/31/2020   AKI (acute kidney injury) ([place]) 03/04/2020   Hyperkalemia 03/04/2020   COPD with acute exacerbation ([place]) 03/02/2020   Marijuana use 01/17/2020   Thrombocytopenia ([place]) 01/17/2020   Positive hepatitis C antibody test 01/17/2020   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 mass index (BMI) of 50.0 to 59.9 in adult [facility]) 01/17/2020   Osteoarthritis of glenohumeral joints (Bilateral) 01/17/2020   Osteoarthritis of AC (acromioclavicular) joints (Bilateral) 01/17/2020   Polysubstance abuse ([place]) 90/24/0973   Toxic metabolic encephalopathy 53/29/9242   Chronic pain syndrome 07/14/2019   DDD (degenerative disc disease), lumbosacral 07/14/2019   DDD (degenerative disc disease), cervical 07/14/2019   CHF (congestive heart failure) ([place]) 04/12/2017   Acute on chronic diastolic CHF (congestive heart failure) ([place]) 02/28/2015   Obstructive sleep apnea 02/28/2015   Acute on chronic diastolic congestive heart failure ([name])    Acute on chronic respiratory failure with hypoxia ([place])    Morbid obesity with BMI of 50.0-59.9, adult ([place]) 04/01/2014   HTN (hypertension) 04/01/2014   PCP:  Center, [place]:   [place], [place] [address] [place] [address] Phone: [phone] Fax: [phone]     Social Determinants of Health (SDOH) Social History: SDOH Screenings   Food Insecurity: No Food Insecurity (04/15/2022)  Housing: Low Risk  (04/15/2022)  Transportation Needs: Unmet Transportation Needs (04/15/2022)  Utilities: Not At Risk (04/15/2022)  Recent Concern: Utilities - At Risk (02/05/2022)  Depression (PHQ2-9): Low Risk  (06/07/2021)  Financial Resource Strain: Medium Risk (05/03/2017)  Physical Activity: Insufficiently Active (05/03/2017)  Social Connections: Moderately Integrated (05/03/2017)  Stress: No Stress Concern Present (05/03/2017)  Tobacco  Use: Medium Risk (04/26/2022)   SDOH Interventions:     Readmission Risk Interventions    12/31/2021    8:58 AM 12/20/2021    4:21 PM 05/04/2021    1:26 PM  Readmission Risk Prevention Plan  Transportation Screening Complete Complete Complete  Medication Review Press photographer) Complete Complete Complete  PCP or Specialist appointment within 3-5 days of discharge Complete Complete Complete  HRI or Home Care Consult Complete Complete Complete  SW Recovery Care/Counseling Consult Complete Complete Complete  Palliative Care Screening Complete Complete Not [place] Not Applicable Not Applicable Not Applicable

## 2022-05-16 NOTE — ED Notes (Addendum)
Pt has been asleep, haven't had 10am meds

## 2022-05-16 NOTE — ED Notes (Addendum)
No needs or complaints at this time. Pt ate dinner tray

## 2022-05-16 NOTE — ED Notes (Signed)
PT/OT at bedside.

## 2022-05-16 NOTE — ED Notes (Signed)
Repostioned patient

## 2022-05-16 NOTE — ED Notes (Signed)
Pt resting with oxygen in place. Denies any needs at this time.

## 2022-05-16 NOTE — Progress Notes (Signed)
Patient assisted with home ventilator for HS, with 4L O2 bleed. PT tolerating well, SpO2 98%.

## 2022-05-16 NOTE — ED Notes (Signed)
Pt resting at this time. No needs or complaints.

## 2022-05-16 NOTE — ED Provider Notes (Signed)
-----------------------------------------   7:12 AM on 05/16/2022 -----------------------------------------   Blood pressure 112/73, pulse 78, temperature 98.2 F (36.8 C), temperature source Oral, resp. rate 18, height '6\' 3"'$  (1.905 m), weight (!) 177.4 kg, SpO2 99 %.  The patient is calm and cooperative at this time.  There have been no acute events since the last update.  Awaiting disposition plan from case management/social work.    Nathaniel Man, MD 05/16/22 [phone]
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
## 2022-05-16 NOTE — Progress Notes (Signed)
Patient assisted with coming off home trilogy and placing himself on 28% ATC. Trilogy stopped and on standby.

## 2022-05-16 NOTE — ED Notes (Signed)
Helped pt off bed pan
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
## 2022-05-16 NOTE — ED Notes (Signed)
Pt helped on bedpan. Call bell within reach for when patient is finished
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
## 2022-05-17 NOTE — ED Notes (Signed)
Transitioned from home bipap to trach collar 5L (28%).

## 2022-05-17 NOTE — ED Notes (Signed)
Report given to Kate, RN

## 2022-05-18 NOTE — ED Notes (Signed)
Patient called out requesting door be closed with slight crack left open. Pt declines curtain to be closed. Pt denies further needs at this time.

## 2022-05-18 NOTE — ED Provider Notes (Signed)
Emergency Medicine Observation Re-evaluation Note  Gary [name] is a 67 y.o. male, seen on rounds today.  Pt initially presented to the ED for complaints of Abdominal Pain  Currently, the patient is calm, no acute complaints.  Physical Exam  Blood pressure 115/85, pulse 80, temperature 98.1 F (36.7 C), temperature source Oral, resp. rate 18, height '6\' 3"'$  (1.905 m), weight (!) 177.4 kg, SpO2 93 %. Physical Exam General: NAD Lungs: CTAB Psych: not agitated  ED Course / MDM  EKG:    I have reviewed the labs performed to date as well as medications administered while in observation.  Recent changes in the last 24 hours include no acute events overnight.    Plan  Current plan is for SW placement   Carrie Mew, MD 05/18/22 [phone]

## 2022-05-18 NOTE — ED Notes (Signed)
Pt provided ginger ale, ice, and snack tray upon request. Bedside table cleaned up. Pt in NAD at this time.
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
## 2022-05-18 NOTE — ED Notes (Signed)
Pt provided ginger ale.  

## 2022-05-18 NOTE — ED Notes (Signed)
Pt called out requesting for RN to remove socks from pt feet. Socks removed and blankets placed back over pt feet per pt request. Gingerale and ice also provided to pt per request. Pt denies pain or other needs at this time. Pt watching tv with lights off. Resting in bed free from sign of distress. Bed low and locked with side rails raised x2. Call bell in reach as well as pt bedside table and belongings.

## 2022-05-18 NOTE — ED Notes (Signed)
Pt sleeping in room

## 2022-05-18 NOTE — Progress Notes (Signed)
Physical Therapy Treatment Patient Details Name: Gary [name] MRN: [id number] DOB: 17-Jul-1955 Today's Date: 05/18/2022   History of Present Illness Gary [name] is a 58yoM who comes to [facility] on 04/26/21 reports his self care needs not being met after prior DC, staffing issues in home and reportedly not able to get his medications. Pt was DC from here 10 days prior after admission for respiratory distress, found to have active influenza infection, also CHF exacerbation. APS now involved in pt's case and he is in process of applying for medicare and obtaining facility placement. 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 At baseline, pt is lift transfers and total care +2, nonambulatory despite extensive time working with rehab in acute setting over several months with goals of maximizing independence in bed mobility and transfers.    PT Comments    The patient was motivated to participate today. He stood x 3 bouts with standing tolerance of 12 seconds, 30 seconds, 7 seconds. He progressed to taking 4 side steps to the right with facilitation for weight shifting and negotiation of rolling walker. He is fatigued with activity. Recommend to get up to the chair daily for upright conditioning with staff using the mechanical lift. Also recommend to continue with home exercise program daily in order to facilitate independence with bed mobility, transfers, and ambulation efforts. PT will continue to follow.      Recommendations for follow up therapy are one component of a multi-disciplinary discharge planning process, led by the attending physician.  Recommendations may be updated based on patient status, additional functional criteria and insurance authorization.  Follow Up Recommendations  Long-term institutional care without follow-up therapy Can patient physically be transported by private vehicle: No   Assistance Recommended at  Discharge Intermittent Supervision/Assistance  Patient can return home with the following Two people to help with bathing/dressing/bathroom;Assistance with cooking/housework;Assist for transportation;Help with stairs or ramp for entrance;Two people to help with walking and/or transfers;Direct supervision/assist for medications management;Direct supervision/assist for financial management   Equipment Recommendations  None recommended by PT    Recommendations for Other Services       Precautions / Restrictions Precautions Precautions: Fall Restrictions Weight Bearing Restrictions: No     Mobility  Bed Mobility Overal bed mobility: Needs Assistance Bed Mobility: Supine to Sit, Sit to Supine     Supine to sit: Max assist Sit to supine: Max assist   General bed mobility comments: assistance mostly for trunk support to sit upright with cues for technique. assistance for LE support to return to bed. increased time and effort with all activity    Transfers Overall transfer level: Needs assistance   Transfers: Sit to/from Stand Sit to Stand: Mod assist, +2 physical assistance, From elevated surface           General transfer comment: anterior weight shifting faciliation provided with cues for increased independence with transfers. 3 standing bouts performed with rest breaks between bouts of standing    Ambulation/Gait Ambulation/Gait assistance: Mod assist, +2 physical assistance Gait Distance (Feet): 2 Feet Assistive device: Rolling walker (2 wheels)         General Gait Details: patient was able to take 4 side steps to the right with faciliation for weight shifting and negotiation of rolling walker. cues for sequencing.   Stairs             Wheelchair Mobility    Modified Rankin (Stroke Patients Only)  Balance Overall balance assessment: Needs assistance Sitting-balance support: Feet supported Sitting balance-[name] Scale: Good     Standing  balance support: Bilateral upper extremity supported Standing balance-[name] Scale: Poor Standing balance comment: standing tolerance of 12 seconds, 30 seconds, 7 seconds                            Cognition Arousal/Alertness: Awake/alert Behavior During Therapy: WFL for tasks assessed/performed Overall Cognitive Status: Within Functional Limits for tasks assessed                                          Exercises      General Comments        Pertinent Vitals/Pain Pain Assessment Pain Assessment: No/denies pain    Home Living                          Prior Function            PT Goals (current goals can now be found in the care plan section) Acute Rehab PT Goals Patient Stated Goal: to stand and walk PT Goal Formulation: With patient Time For Goal Achievement: 05/25/22 Potential to Achieve Goals: Poor Progress towards PT goals: Progressing toward goals    Frequency    Min 2X/week      PT Plan Current plan remains appropriate    Co-evaluation              AM-PAC PT "6 Clicks" Mobility   Outcome Measure  Help needed turning from your back to your side while in a flat bed without using bedrails?: A Lot Help needed moving from lying on your back to sitting on the side of a flat bed without using bedrails?: Total Help needed moving to and from a bed to a chair (including a wheelchair)?: Total Help needed standing up from a chair using your arms (e.g., wheelchair or bedside chair)?: Total Help needed to walk in hospital room?: Total Help needed climbing 3-5 steps with a railing? : Total 6 Click Score: 7    End of Session Equipment Utilized During Treatment:  (trach mask) Activity Tolerance: Patient tolerated treatment well Patient left: in bed;with call bell/phone within reach   PT Visit Diagnosis: Unsteadiness on feet (R26.81);Muscle weakness (generalized) (M62.81);Difficulty in walking, not elsewhere classified  (R26.2)     Time: 0034-9179 PT Time Calculation (min) (ACUTE ONLY): 30 min  Charges:  $Therapeutic Activity: 23-37 mins                     [name], PT, MPT    [name] 05/18/2022, 2:37 PM

## 2022-05-19 NOTE — ED Notes (Signed)
RN to bedside. Pt is eating dinner at this time and has no complaints.

## 2022-05-19 NOTE — ED Provider Notes (Signed)
-----------------------------------------   6:18 AM on 05/19/2022 -----------------------------------------   Blood pressure 115/85, pulse 80, temperature 98.1 F (36.7 C), temperature source Oral, resp. rate 18, height 1.905 m ('6\' 3"'$ ), weight (!) 177.4 kg, SpO2 93 %.  The patient was noted to be on a regular diet.  However, I have a recurrent issue with his placement seems to be his weight because multiple living facilities have weight limits.  A registered dietitian order was placed weeks ago, but apparently that is not a service that is easily available at [facility].  I modified the patient's diet, canceling the regular diet order and placing an order for heart healthy/carb modified in an effort to improve his nutritional status.   Hinda Kehr, MD 05/19/22 [phone]

## 2022-05-19 NOTE — ED Notes (Signed)
RN to bedside to introduce self to pt. Pt is CAOX4 and in no acute distress. Pt had eaten his lunch and trash discarded.

## 2022-05-19 NOTE — ED Notes (Signed)
Pt finished with breakfast tray, trash cleared from bedside table. Denies needs.

## 2022-05-19 NOTE — Progress Notes (Signed)
Patient taken off of home vent per patient request, placed back on TC 5L 28%. Patient declines any additional trach care at this time.

## 2022-05-19 NOTE — ED Notes (Signed)
RN to bedside to answer call bell. Pt asking for more snacks. RN advised he was not allowed to have any more snacks at this time.

## 2022-05-19 NOTE — ED Notes (Signed)
Pt provided with cranberry juice and ice water per request. Pt in NAD at this time

## 2022-05-19 NOTE — ED Provider Notes (Signed)
-----------------------------------------   3:28 AM on 05/19/2022 -----------------------------------------   Blood pressure 115/85, pulse 80, temperature 98.1 F (36.7 C), temperature source Oral, resp. rate 18, height '6\' 3"'$  (1.905 m), weight (!) 177.4 kg, SpO2 93 %.  The patient is calm and cooperative at this time.  There have been no acute events since the last update.  Awaiting disposition plan from case management/social work.    Nathaniel Man, MD 05/19/22 [phone]

## 2022-05-19 NOTE — ED Notes (Signed)
Pharmacy contacted for missing meds

## 2022-05-19 NOTE — ED Notes (Signed)
Lunch delivered to pt by dietary. Pt expressed frustration to this RN that diet was changed. RN informed pt that diet was changed by ER doctor last night. Pt states he wants to walk out of here. Rn informed pt that he is not able to walk without assistance and therefore cannot walk out of here.  Denies further needs.

## 2022-05-20 NOTE — ED Notes (Signed)
Patient helped repositioned in bed to be more comfortable.

## 2022-05-20 NOTE — Progress Notes (Signed)
Patient now off home vent and back on 28% aerosol trach collar. No distress during night. Trach care and suctioning performed prior to placing on home vent.

## 2022-05-20 NOTE — ED Notes (Signed)
new urine bag placed on pt, clean chuck placed underneath patient. No further needs at this time.

## 2022-05-20 NOTE — ED Notes (Signed)
Bedpan provided to patient. Patient had a BM. Pt cleansed and new chucks placed.

## 2022-05-20 NOTE — ED Notes (Signed)
Medication given. Pt sitting up in bed with dinner. Pt comfortable. No pain.

## 2022-05-20 NOTE — ED Notes (Signed)
Pt defers vitals at this time while he is eating

## 2022-05-20 NOTE — ED Provider Notes (Signed)
-----------------------------------------   6:10 AM on 05/20/2022 -----------------------------------------   Blood pressure 125/84, pulse 81, temperature 97.9 F (36.6 C), resp. rate 20, height 1.905 m ('6\' 3"'$ ), weight (!) 177.4 kg, SpO2 95 %.  The patient is calm at this time.  There have been no acute events since the last update.  Awaiting disposition plan from [facility] team.   Hinda Kehr, MD 05/20/22 [phone]

## 2022-05-20 NOTE — ED Notes (Signed)
Pt had BM. Pt cleaned up. Flaky skin breakdown on both left and right buttocks. Repositioned in bed.

## 2022-05-21 NOTE — TOC Progression Note (Signed)
Transition of Care [facility]) - Progression Note    Patient Details  Name: Gary [name] MRN: [id number] Date of Birth: October 18, 1955  [facility] CM/SW Contact  Shelbie Hutching, RN Phone Number: 05/21/2022, 12:33 PM  Clinical Narrative:    No updates at this time, did not hear back from [facility] and patient has been denied everywhere else due to weight.    Expected Discharge Plan: [place] Barriers to Discharge: ED Unsafe disposition  Expected Discharge Plan and Services   Discharge Planning Services: CM Consult Post Acute Care Choice: [place] Living arrangements for the past 2 months: Single Family Home                 DME Arranged: N/A DME Agency: NA                   Social Determinants of Health (SDOH) Interventions SDOH Screenings   Food Insecurity: No Food Insecurity (04/15/2022)  Housing: Low Risk  (04/15/2022)  Transportation Needs: Unmet Transportation Needs (04/15/2022)  Utilities: Not At Risk (04/15/2022)  Recent Concern: Utilities - At Risk (02/05/2022)  Depression (PHQ2-9): Low Risk  (06/07/2021)  Financial Resource Strain: Medium Risk (05/03/2017)  Physical Activity: Insufficiently Active (05/03/2017)  Social Connections: Moderately Integrated (05/03/2017)  Stress: No Stress Concern Present (05/03/2017)  Tobacco Use: Medium Risk (04/26/2022)    Readmission Risk Interventions    12/31/2021    8:58 AM 12/20/2021    4:21 PM 05/04/2021    1:26 PM  Readmission Risk Prevention Plan  Transportation Screening Complete Complete Complete  Medication Review Press photographer) Complete Complete Complete  PCP or Specialist appointment within 3-5 days of discharge Complete Complete Complete  HRI or Home Care Consult Complete Complete Complete  SW Recovery Care/Counseling Consult Complete Complete Complete  Palliative Care Screening Complete Complete Not [place] Not Applicable Not Applicable Not  Applicable

## 2022-05-21 NOTE — Progress Notes (Signed)
Physical Therapy Treatment Patient Details Name: DEMONTREZ RINDFLEISCH MRN: [id number] DOB: [birth year] Today's Date: 05/21/2022   History of Present Illness Ishaq Maffei is a 41yoM who comes to [facility] on 04/26/21 reports his self care needs not being met after prior DC, staffing issues in home and reportedly not able to get his medications. Pt was DC from here 10 days prior after admission for respiratory distress, found to have active influenza infection, also CHF exacerbation. APS now involved in pt's case and he is in process of applying for medicare and obtaining facility placement. 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 At baseline, pt is lift transfers and total care +2, nonambulatory despite extensive time working with rehab in acute setting over several months with goals of maximizing independence in bed mobility and transfers.    PT Comments    Patient is agreeable to PT session today. He was able to stand x 2 bouts with standing tolerance of up to 16 seconds. He wants to try a stand step transfer from bed to recliner chair tomorrow. Encouraged home exercise program for strengthening to increase independence with transfers. Patient is making progress with increased activity tolerance with the goal of increased independence with bed mobility, transfers, increased standing tolerance, and initiation of ambulation. PT will continue to follow.    Recommendations for follow up therapy are one component of a multi-disciplinary discharge planning process, led by the attending physician.  Recommendations may be updated based on patient status, additional functional criteria and insurance authorization.  Follow Up Recommendations  Other (comment) (LTC with therapy follow up) Can patient physically be transported by private vehicle: No   Assistance Recommended at Discharge Intermittent Supervision/Assistance  Patient can return home  with the following Two people to help with bathing/dressing/bathroom;Assistance with cooking/housework;Assist for transportation;Help with stairs or ramp for entrance;Two people to help with walking and/or transfers;Direct supervision/assist for medications management;Direct supervision/assist for financial management   Equipment Recommendations  None recommended by PT    Recommendations for Other Services       Precautions / Restrictions Precautions Precautions: Fall Precaution Comments: cuffed trach/ trach collar Restrictions Weight Bearing Restrictions: No     Mobility  Bed Mobility Overal bed mobility: Needs Assistance Bed Mobility: Supine to Sit, Sit to Supine     Supine to sit: Mod assist Sit to supine: Mod assist   General bed mobility comments: assistance for LLE support and trunk support to sit upright. increased time and effort required. assistance for LE support to return to bed. cues for task initiation and sequencing    Transfers Overall transfer level: Needs assistance   Transfers: Sit to/from Stand Sit to Stand: Mod assist, +2 physical assistance           General transfer comment: emphasis on anterior weight shifting and relying mostly on lower body strength to facilitate standing with carry over demonstrated. 2 bouts of standing performed.    Ambulation/Gait               General Gait Details: not attempted this date   Stairs             Wheelchair Mobility    Modified Rankin (Stroke Patients Only)       Balance Overall balance assessment: Needs assistance Sitting-balance support: Feet supported Sitting balance-[name] Scale: Good     Standing balance support: Bilateral upper extremity supported Standing balance-[name] Scale: Poor Standing balance comment: standing tolerance  up to 16 seconds                            Cognition Arousal/Alertness: Awake/alert Behavior During Therapy: WFL for tasks  assessed/performed Overall Cognitive Status: Within Functional Limits for tasks assessed                                          Exercises      General Comments General comments (skin integrity, edema, etc.): encouraged patient to perform lower extremity exercises today. he is requesting to attempt transfer from bed to chair tomorrow.      Pertinent Vitals/Pain Pain Assessment Pain Assessment: No/denies pain    Home Living                          Prior Function            PT Goals (current goals can now be found in the care plan section) Acute Rehab PT Goals Patient Stated Goal: to stand and walk PT Goal Formulation: With patient Time For Goal Achievement: 05/25/22 Potential to Achieve Goals: Poor Progress towards PT goals: Progressing toward goals    Frequency    Min 2X/week      PT Plan Discharge plan needs to be updated    Co-evaluation PT/OT/SLP Co-Evaluation/Treatment: Yes Reason for Co-Treatment: For patient/therapist safety;To address functional/ADL transfers PT goals addressed during session: Mobility/safety with mobility;Balance OT goals addressed during session: ADL's and self-care      AM-PAC PT "6 Clicks" Mobility   Outcome Measure  Help needed turning from your back to your side while in a flat bed without using bedrails?: A Lot Help needed moving from lying on your back to sitting on the side of a flat bed without using bedrails?: Total Help needed moving to and from a bed to a chair (including a wheelchair)?: Total Help needed standing up from a chair using your arms (e.g., wheelchair or bedside chair)?: Total Help needed to walk in hospital room?: Total Help needed climbing 3-5 steps with a railing? : Total 6 Click Score: 7    End of Session Equipment Utilized During Treatment: Oxygen Activity Tolerance: Patient tolerated treatment well Patient left: in bed;with call bell/phone within reach   PT Visit  Diagnosis: Unsteadiness on feet (R26.81);Muscle weakness (generalized) (M62.81);Difficulty in walking, not elsewhere classified (R26.2)     Time: 4503-8882 PT Time Calculation (min) (ACUTE ONLY): 28 min  Charges:  $Therapeutic Activity: 8-22 mins                     [name], PT, MPT    [name] 05/21/2022, 1:38 PM

## 2022-05-21 NOTE — Progress Notes (Signed)
Occupational Therapy Treatment Patient Details Name: Gary [name] MRN: [id number] DOB: [birth year] Today's Date: 05/21/2022   History of present illness Gary [name] is a 42yoM who comes to [facility] on 04/26/21 reports his self care needs not being met after prior DC, staffing issues in home and reportedly not able to get his medications. Pt was DC from here 10 days prior after admission for respiratory distress, found to have active influenza infection, also CHF exacerbation. APS now involved in pt's case and he is in process of applying for medicare and obtaining facility placement. 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 At baseline, pt is lift transfers and total care +2, nonambulatory despite extensive time working with rehab in acute setting over several months with goals of maximizing independence in bed mobility and transfers.   OT comments  Gary [name] was seen for OT re-evaluation on this date. Pt seen as co-tx with physical therapy to maximize safety and success with functional mobility and ADL management. Charges split per facility protocols. Upon arrival to room pt awake/alert, endorses feeling tired, but otherwise agreeable to tx session. OT/PT facilitated functional mobility as described below. Pt has made good progress with mobility goals and is able to perform be mobility with +1 MOD A, STS attempts x2 with +2 MOD A and max cueing for sequencing/body positioning. Pt continues to be functionally limited by generalized weakness, decreased activity tolerance, and cardiopulmonary status. He requires MAX A to don shoes at bed level prior to mobility attempts and SET UP assist for UB grooming tasks once back to bed at end of session. POC updated to reflect functional progress. Pt continues to benefit from skilled OT services to maximize return to PLOF and minimize risk of future falls, injury, caregiver burden, and readmission.  Discharge recommendation for LTC remains appropriate with follow up OT added to reflect pt goals to improve mobility and safety with functional activities.     Recommendations for follow up therapy are one component of a multi-disciplinary discharge planning process, led by the attending physician.  Recommendations may be updated based on patient status, additional functional criteria and insurance authorization.    Follow Up Recommendations  OT at Long-term acute care hospital     Assistance Recommended at Discharge Frequent or constant Supervision/Assistance  Patient can return home with the following  Two people to help with walking and/or transfers;Two people to help with bathing/dressing/bathroom;Assistance with cooking/housework;Help with stairs or ramp for entrance;Assist for transportation   Equipment Recommendations  Other (comment) (defer)    Recommendations for Other Services      Precautions / Restrictions Precautions Precautions: Fall Precaution Comments: cuffed trach/ trach collar Restrictions Weight Bearing Restrictions: No       Mobility Bed Mobility Overal bed mobility: Needs Assistance Bed Mobility: Supine to Sit, Sit to Supine     Supine to sit: Mod assist Sit to supine: Mod assist        Transfers Overall transfer level: Needs assistance Equipment used:  Judie Petit RW) Transfers: Sit to/from Stand Sit to Stand: Mod assist, +2 physical assistance, From elevated surface           General transfer comment: anterior weight shifting faciliation provided with cues for increased independence with transfers. 2 standing bouts performed with rest breaks attempts     Balance Overall balance assessment: Needs assistance Sitting-balance support: Feet supported Sitting balance-[name] Scale: Good Sitting balance - Comments: Steady sitting, reaching within  BOS.   Standing balance support: Reliant on assistive device for balance, Bilateral upper extremity  supported, During functional activity Standing balance-[name] Scale: Poor                             ADL either performed or assessed with clinical judgement   ADL Overall ADL's : Needs assistance/impaired Eating/Feeding: Sitting;Modified independent Eating/Feeding Details (indicate cue type and reason): May require assist to reach items on tray placed outside BOS Grooming: Sitting;Set up;Supervision/safety;Wash/dry face                               Functional mobility during ADLs: +2 for safety/equipment;+2 for physical assistance;Moderate assistance General ADL Comments: Continues to require MAX A don B shoes at bed level, MOD A +2 for STS attempts from elevated bed.    Extremity/Trunk Assessment Upper Extremity Assessment Upper Extremity Assessment: Generalized weakness   Lower Extremity Assessment Lower Extremity Assessment: Generalized weakness        Vision Patient Visual Report: No change from baseline     Perception     Praxis      Cognition Arousal/Alertness: Awake/alert Behavior During Therapy: WFL for tasks assessed/performed Overall Cognitive Status: Within Functional Limits for tasks assessed                                          Exercises Other Exercises Other Exercises: OT facilitated UB grooming tasks and educated on use of theraband for UE HEP during hospital stay. Additional support provided during functional mobility and LB dressing tasks as described above. See ADL section for addtional detail.    Shoulder Instructions       General Comments      Pertinent Vitals/ Pain       Pain Assessment Pain Assessment: No/denies pain  Home Living                                          Prior Functioning/Environment              Frequency  Min 2X/week        Progress Toward Goals  OT Goals(current goals can now be found in the care plan section)  Progress towards OT goals:  Progressing toward goals  Acute Rehab OT Goals Patient Stated Goal: "to walk to the chair tomorrow" OT Goal Formulation: With patient Time For Goal Achievement: 06/04/22 Potential to Achieve Goals: Fair ADL Goals Pt Will Perform Grooming: sitting;with modified independence Pt Will Perform Lower Body Dressing: with min assist;sitting/lateral leans;with adaptive equipment (c LRAD PRN) Pt Will Transfer to Toilet: bedside commode;stand pivot transfer;with min assist;with +2 assist (c BARI BSC/BARI RW)  Plan Discharge plan remains appropriate;Frequency remains appropriate    Co-evaluation    PT/OT/SLP Co-Evaluation/Treatment: Yes Reason for Co-Treatment: For patient/therapist safety;To address functional/ADL transfers PT goals addressed during session: Mobility/safety with mobility;Balance OT goals addressed during session: ADL's and self-care      AM-PAC OT "6 Clicks" Daily Activity     Outcome Measure   Help from another person eating meals?: None Help from another person taking care of personal grooming?: A Little Help from another person toileting, which includes using toliet, bedpan, or  urinal?: A Lot Help from another person bathing (including washing, rinsing, drying)?: A Lot Help from another person to put on and taking off regular upper body clothing?: A Little Help from another person to put on and taking off regular lower body clothing?: A Lot 6 Click Score: 16    End of Session Equipment Utilized During Treatment: Rolling walker (2 wheels)  OT Visit Diagnosis: Other abnormalities of gait and mobility (R26.89);Muscle weakness (generalized) (M62.81)   Activity Tolerance Patient tolerated treatment well   Patient Left in bed;with call bell/phone within reach   Nurse Communication          Time: 1245-1313 OT Time Calculation (min): 28 min  Charges: OT General Charges $OT Visit: 1 Visit OT Evaluation $OT Re-eval: 1 Re-eval OT Treatments $Self Care/Home  Management : 8-22 mins  Shara Blazing, M.S., OTR/L 05/21/22, 1:37 PM

## 2022-05-21 NOTE — Progress Notes (Signed)
Patient placed back on TC 28% 5L at this time. Home vent on standby. Patient declined assistance with trach care at this time.

## 2022-05-21 NOTE — ED Provider Notes (Signed)
-----------------------------------------   3:26 AM on 05/21/2022 -----------------------------------------   Blood pressure 128/84, pulse 84, temperature 98.4 F (36.9 C), resp. rate 19, height '6\' 3"'$  (1.905 m), weight (!) 177.4 kg, SpO2 93 %.  The patient is calm and cooperative at this time.  There have been no acute events since the last update.  Awaiting disposition plan from case management/social work.    Acelynn Dejonge, Delice Bison, DO 05/21/22 [phone]

## 2022-05-21 NOTE — ED Notes (Signed)
Pt assisted to bedpan for bowel movement. Cleaned and repositioned to position of comfort. No additional needs or c/o voiced at this time

## 2022-05-22 NOTE — ED Notes (Signed)
Patient resting in bed. NAD noted. Patient given ice water and cranberry juice as requested. No other needs expressed at this time.

## 2022-05-22 NOTE — Progress Notes (Signed)
Occupational Therapy Treatment Patient Details Name: Gary [name] MRN: [id number] DOB: 01/11/1956 Today's Date: 05/22/2022   History of present illness Gary [name] is a 26yoM who comes to [facility] on 04/26/21 reports his self care needs not being met after prior DC, staffing issues in home and reportedly not able to get his medications. Pt was DC from here 10 days prior after admission for respiratory distress, found to have active influenza infection, also CHF exacerbation. APS now involved in pt's case and he is in process of applying for medicare and obtaining facility placement. 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 At baseline, pt is lift transfers and total care +2, nonambulatory despite extensive time working with rehab in acute setting over several months with goals of maximizing independence in bed mobility and transfers.   OT comments  Gary [name] was seen for OT/PT co-treatment on this date. Upon arrival to room pt reclined in bed, agreeable to tx. Pt requires MIN A exit bed. Improved motivation for OOB to chair. MOD A x2 + RW sit<>stand from max elevated bed height and steps bed>chair. RN notified of need for hoyer sling to return to bed. Pt making good progress toward goals, will continue to follow POC. Discharge recommendation remains appropriate.     Recommendations for follow up therapy are one component of a multi-disciplinary discharge planning process, led by the attending physician.  Recommendations may be updated based on patient status, additional functional criteria and insurance authorization.    Follow Up Recommendations  OT at Long-term acute care hospital     Assistance Recommended at Discharge Frequent or constant Supervision/Assistance  Patient can return home with the following  Two people to help with walking and/or transfers;Two people to help with bathing/dressing/bathroom;Assistance with  cooking/housework;Help with stairs or ramp for entrance;Assist for transportation   Equipment Recommendations  Hospital bed    Recommendations for Other Services      Precautions / Restrictions Precautions Precautions: Fall Restrictions Weight Bearing Restrictions: No       Mobility Bed Mobility Overal bed mobility: Needs Assistance Bed Mobility: Supine to Sit     Supine to sit: Min assist          Transfers Overall transfer level: Needs assistance Equipment used: Rolling walker (2 wheels) Transfers: Sit to/from Stand Sit to Stand: Mod assist, +2 physical assistance, From elevated surface     Step pivot transfers: Mod assist, +2 physical assistance           Balance Overall balance assessment: Needs assistance Sitting-balance support: Feet supported Sitting balance-[name] Scale: Good     Standing balance support: Reliant on assistive device for balance, Bilateral upper extremity supported, During functional activity Standing balance-[name] Scale: Poor                             ADL either performed or assessed with clinical judgement   ADL Overall ADL's : Needs assistance/impaired                                       General ADL Comments: MOD A x2 + RW for simulated BSC t/f. MAX A don B shoes seated EOB      Cognition Arousal/Alertness: Awake/alert Behavior During Therapy: WFL for tasks assessed/performed Overall Cognitive Status: Within Functional Limits for  tasks assessed                                                     Pertinent Vitals/ Pain       Pain Assessment Pain Assessment: No/denies pain   Frequency  Min 2X/week        Progress Toward Goals  OT Goals(current goals can now be found in the care plan section)  Progress towards OT goals: Progressing toward goals  Acute Rehab OT Goals Patient Stated Goal: to walk OT Goal Formulation: With patient Time For Goal Achievement:  06/04/22 Potential to Achieve Goals: Fair ADL Goals Pt Will Perform Grooming: sitting;with modified independence Pt Will Perform Lower Body Dressing: with min assist;sitting/lateral leans;with adaptive equipment Pt Will Transfer to Toilet: bedside commode;stand pivot transfer;with min assist;with +2 assist Pt/caregiver will Perform Home Exercise Program: Increased strength;Both right and left upper extremity;With theraband;With written HEP provided;Independently  Plan Discharge plan remains appropriate;Frequency remains appropriate    Co-evaluation    PT/OT/SLP Co-Evaluation/Treatment: Yes Reason for Co-Treatment: For patient/therapist safety;To address functional/ADL transfers PT goals addressed during session: Mobility/safety with mobility OT goals addressed during session: ADL's and self-care      AM-PAC OT "6 Clicks" Daily Activity     Outcome Measure   Help from another person eating meals?: None Help from another person taking care of personal grooming?: A Little Help from another person toileting, which includes using toliet, bedpan, or urinal?: A Lot Help from another person bathing (including washing, rinsing, drying)?: A Lot Help from another person to put on and taking off regular upper body clothing?: A Little Help from another person to put on and taking off regular lower body clothing?: A Lot 6 Click Score: 16    End of Session Equipment Utilized During Treatment: Rolling walker (2 wheels)  OT Visit Diagnosis: Other abnormalities of gait and mobility (R26.89);Muscle weakness (generalized) (M62.81)   Activity Tolerance Patient tolerated treatment well   Patient Left with call bell/phone within reach;in chair   Nurse Communication Mobility status;Need for lift equipment        Time: 1336-1405 OT Time Calculation (min): 29 min  Charges: OT General Charges $OT Visit: 1 Visit OT Treatments $Self Care/Home Management : 8-22 mins  [name], M.S. OTR/L   05/22/22, 2:58 PM  ascom [phone]

## 2022-05-22 NOTE — ED Provider Notes (Signed)
-----------------------------------------   4:22 AM on 05/22/2022 -----------------------------------------   Blood pressure 132/76, pulse 80, temperature 98.6 F (37 C), temperature source Oral, resp. rate 18, height '6\' 3"'$  (1.905 m), weight (!) 177.4 kg, SpO2 95 %.  The patient is calm and cooperative at this time.  There have been no acute events since the last update.  Awaiting disposition plan from case management/social work.    Jessee Mezera, Delice Bison, DO 05/22/22 [phone]

## 2022-05-22 NOTE — Progress Notes (Signed)
Patient taken off of home vent per patient request, placed back on TC 5L 28%. Home vent on standby. Patient declines trach care at this time. All trach care equipment at bedside.

## 2022-05-22 NOTE — Progress Notes (Signed)
Physical Therapy Treatment Patient Details Name: Gary [name] MRN: [id number] DOB: 23-Jun-1955 Today's Date: 05/22/2022   History of Present Illness Barth Trella is a 75yoM who comes to [facility] on 04/26/21 reports his self care needs not being met after prior DC, staffing issues in home and reportedly not able to get his medications. Pt was DC from here 10 days prior after admission for respiratory distress, found to have active influenza infection, also CHF exacerbation. APS now involved in pt's case and he is in process of applying for medicare and obtaining facility placement. 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 At baseline, pt is lift transfers and total care +2, nonambulatory despite extensive time working with rehab in acute setting over several months with goals of maximizing independence in bed mobility and transfers.    PT Comments    The patient is making progress today and was able to perform stand step transfer from bed to chair with rolling walker and +2 person assistance today. Increased standing tolerance as well. Encouraged LE exercises while in the chair. In conjunction to therapy efforts, the patient would benefit to be up to the chair daily for upright conditioning, using mechanical lift as needed. PT will continue to follow.    Recommendations for follow up therapy are one component of a multi-disciplinary discharge planning process, led by the attending physician.  Recommendations may be updated based on patient status, additional functional criteria and insurance authorization.  Follow Up Recommendations   (LTC with therapy follow up) Can patient physically be transported by private vehicle: No   Assistance Recommended at Discharge Intermittent Supervision/Assistance  Patient can return home with the following Two people to help with bathing/dressing/bathroom;Assistance with cooking/housework;Assist for  transportation;Help with stairs or ramp for entrance;Two people to help with walking and/or transfers;Direct supervision/assist for medications management;Direct supervision/assist for financial management   Equipment Recommendations  None recommended by PT    Recommendations for Other Services       Precautions / Restrictions Precautions Precautions: Fall Restrictions Weight Bearing Restrictions: No     Mobility  Bed Mobility Overal bed mobility: Needs Assistance Bed Mobility: Supine to Sit     Supine to sit: Min assist     General bed mobility comments: improved independence with bed mobility this session. Min A required for occasional LLE support. no trunk support required. cues for technique    Transfers Overall transfer level: Needs assistance Equipment used: Rolling walker (2 wheels) Transfers: Sit to/from Stand Sit to Stand: Mod assist, +2 physical assistance   Step pivot transfers: Mod assist, +2 physical assistance       General transfer comment: emphasis on anterior weight shifting and relying mostly on lower body strength to facilitate standing with carry over demonstrated. patient was able to take 3-4 steps from bed to chair with steadying asistance and occasional assistance for negotiation for rolling walker    Ambulation/Gait                   Stairs             Wheelchair Mobility    Modified Rankin (Stroke Patients Only)       Balance Overall balance assessment: Needs assistance Sitting-balance support: Feet supported Sitting balance-[name] Scale: Good     Standing balance support: Bilateral upper extremity supported, Reliant on assistive device for balance Standing balance-[name] Scale: Poor Standing balance comment: standing tolerance 30-40 seconds  Cognition Arousal/Alertness: Awake/alert Behavior During Therapy: WFL for tasks assessed/performed Overall Cognitive Status: Within  Functional Limits for tasks assessed                                          Exercises Other Exercises Other Exercises: encouraged LE exercises in the chair    General Comments        Pertinent Vitals/Pain Pain Assessment Pain Assessment: No/denies pain    Home Living                          Prior Function            PT Goals (current goals can now be found in the care plan section) Acute Rehab PT Goals Patient Stated Goal: to be walking within a month PT Goal Formulation: With patient Time For Goal Achievement: 05/25/22 Potential to Achieve Goals: Poor Progress towards PT goals: Progressing toward goals    Frequency    Min 2X/week      PT Plan Discharge plan needs to be updated;Current plan remains appropriate    Co-evaluation PT/OT/SLP Co-Evaluation/Treatment: Yes Reason for Co-Treatment: Complexity of the patient's impairments (multi-system involvement);For patient/therapist safety;To address functional/ADL transfers PT goals addressed during session: Mobility/safety with mobility OT goals addressed during session: ADL's and self-care      AM-PAC PT "6 Clicks" Mobility   Outcome Measure  Help needed turning from your back to your side while in a flat bed without using bedrails?: A Lot Help needed moving from lying on your back to sitting on the side of a flat bed without using bedrails?: Total Help needed moving to and from a bed to a chair (including a wheelchair)?: Total Help needed standing up from a chair using your arms (e.g., wheelchair or bedside chair)?: Total Help needed to walk in hospital room?: Total Help needed climbing 3-5 steps with a railing? : Total 6 Click Score: 7    End of Session Equipment Utilized During Treatment:  (trach mask oxygen, PMSV) Activity Tolerance: Patient tolerated treatment well Patient left: in chair;with call bell/phone within reach Nurse Communication: Mobility status;Need for lift  equipment PT Visit Diagnosis: Unsteadiness on feet (R26.81);Muscle weakness (generalized) (M62.81);Difficulty in walking, not elsewhere classified (R26.2)     Time: 5189-8421 PT Time Calculation (min) (ACUTE ONLY): 29 min  Charges:  $Therapeutic Activity: 8-22 mins                    [name], PT, MPT    [name] 05/22/2022, 4:01 PM

## 2022-05-22 NOTE — ED Notes (Signed)
Patient's urine canister emptied at this time. No needs expressed to RN.

## 2022-05-22 NOTE — ED Notes (Signed)
Patient chuck pad wet. Patient cleaned, new pad placed. New purewick and gown placed at this time. NAD noted. No other needs expressed at this time.

## 2022-05-23 NOTE — ED Notes (Addendum)
Patient demanding that pulse oximetry be taken off his finger. Education provided regarding need to wear pulse oximetry. Pulse oximetry removed per request. Ice water and cranberry juice given per request. Urine emptied from purewick. No distress noted at this time.

## 2022-05-23 NOTE — ED Notes (Signed)
Pt placed on bedpan for BM. Pt had small amount with scant light pink blood noticed in stool and on wipes. Pt was cleaned and new chucks placed. Urinal emptied.

## 2022-05-23 NOTE — Progress Notes (Signed)
Patient seen to assist with home ventilator for HS. Patient visited earlier, and stated that he would wear it tonight, but upon  RT arrival he declined and refused to wear his home vent tonight. PT currently stable on 28% ATC. No suction needed, patient stated that he did that prior to RT arrival.

## 2022-05-23 NOTE — ED Provider Notes (Signed)
-----------------------------------------   6:12 AM on 05/23/2022 -----------------------------------------   Blood pressure 121/77, pulse 81, temperature 98.1 F (36.7 C), temperature source Oral, resp. rate 16, height '6\' 3"'$  (1.905 m), weight (!) 177.4 kg, SpO2 94 %.  The patient is calm and cooperative at this time.  There have been no acute events since the last update.  Awaiting disposition plan from Social Work team.   Paulette Blanch, MD 05/23/22 [phone]

## 2022-05-23 NOTE — Progress Notes (Addendum)
Physical Therapy Treatment Patient Details Name: Gary [name] MRN: [id number] DOB: 05-23-1955 Today's Date: 05/23/2022   History of Present Illness Gary [name] is a 85yoM who comes to [facility] on 04/26/21 reports his self care needs not being met after prior DC, staffing issues in home and reportedly not able to get his medications. Pt was DC from here 10 days prior after admission for respiratory distress, found to have active influenza infection, also CHF exacerbation. APS now involved in pt's case and he is in process of applying for medicare and obtaining facility placement. 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 At baseline, pt is lift transfers and total care +2, nonambulatory despite extensive time working with rehab in acute setting over several months with goals of maximizing independence in bed mobility and transfers.    PT Comments    Patient is agreeable to PT. He was eager to try getting to the chair again. Min A required for bed mobility. +2 person assistance for standing from bed and stand step transfer from bed to chair with cues for technique. Patient seated in recliner chair at end of session. Encouraged LE exercises while in chair for strengthening. The patient will need the lift to get back to bed due to low chair height and generalized weakness. PT will continue to follow.   Addendum: Assisted patient back to bed around 3:45pm with the lift. Weight via hoyer lift is 401 lbs.   Recommendations for follow up therapy are one component of a multi-disciplinary discharge planning process, led by the attending physician.  Recommendations may be updated based on patient status, additional functional criteria and insurance authorization.  Follow Up Recommendations   (LTC with therapy follow up) Can patient physically be transported by private vehicle: No   Assistance Recommended at Discharge Intermittent  Supervision/Assistance  Patient can return home with the following Two people to help with bathing/dressing/bathroom;Assistance with cooking/housework;Assist for transportation;Help with stairs or ramp for entrance;Two people to help with walking and/or transfers;Direct supervision/assist for medications management;Direct supervision/assist for financial management   Equipment Recommendations  None recommended by PT    Recommendations for Other Services       Precautions / Restrictions Precautions Precautions: Fall Restrictions Weight Bearing Restrictions: No     Mobility  Bed Mobility Overal bed mobility: Needs Assistance Bed Mobility: Supine to Sit     Supine to sit: Min assist     General bed mobility comments: assistance for LLE support. increased time and effort required. verbal cues for technique with increased time and effort required    Transfers Overall transfer level: Needs assistance Equipment used: Rolling walker (2 wheels) Transfers: Sit to/from Stand Sit to Stand: Mod assist, +2 physical assistance   Step pivot transfers: Mod assist, +2 physical assistance       General transfer comment: verbal cues for technique to increase independence with standing including anterior weight shifitng and using LE strength to faciliate standing.    Ambulation/Gait               General Gait Details: not attempted this date   Stairs             Wheelchair Mobility    Modified Rankin (Stroke Patients Only)       Balance Overall balance assessment: Needs assistance Sitting-balance support: Feet supported Sitting balance-[name] Scale: Good     Standing balance support: Bilateral upper extremity supported, Reliant on assistive device  for balance Standing balance-[name] Scale: Poor Standing balance comment: standing tolerance 30-40 seconds                            Cognition Arousal/Alertness: Awake/alert Behavior During Therapy: WFL  for tasks assessed/performed Overall Cognitive Status: Within Functional Limits for tasks assessed                                          Exercises Other Exercises Other Exercises: encouraged LAQ, seated marching while sitting in chair    General Comments        Pertinent Vitals/Pain Pain Assessment Pain Assessment: No/denies pain    Home Living                          Prior Function            PT Goals (current goals can now be found in the care plan section) Acute Rehab PT Goals Patient Stated Goal: to be walking within a month PT Goal Formulation: With patient Time For Goal Achievement: 05/25/22 Potential to Achieve Goals: Poor Progress towards PT goals: Progressing toward goals    Frequency    Min 2X/week      PT Plan Current plan remains appropriate    Co-evaluation PT/OT/SLP Co-Evaluation/Treatment: Yes Reason for Co-Treatment: For patient/therapist safety;To address functional/ADL transfers PT goals addressed during session: Mobility/safety with mobility OT goals addressed during session: ADL's and self-care      AM-PAC PT "6 Clicks" Mobility   Outcome Measure  Help needed turning from your back to your side while in a flat bed without using bedrails?: A Lot Help needed moving from lying on your back to sitting on the side of a flat bed without using bedrails?: Total Help needed moving to and from a bed to a chair (including a wheelchair)?: Total Help needed standing up from a chair using your arms (e.g., wheelchair or bedside chair)?: Total Help needed to walk in hospital room?: Total Help needed climbing 3-5 steps with a railing? : Total 6 Click Score: 7    End of Session Equipment Utilized During Treatment:  (trach mask oxygen, PMSV) Activity Tolerance: Patient tolerated treatment well Patient left: in chair;with call bell/phone within reach Nurse Communication: Mobility status;Need for lift equipment PT Visit  Diagnosis: Unsteadiness on feet (R26.81);Muscle weakness (generalized) (M62.81);Difficulty in walking, not elsewhere classified (R26.2)     Time: 3220-2542 PT Time Calculation (min) (ACUTE ONLY): 29 min  Charges:  $Therapeutic Activity: 8-22 mins                     [name], PT, MPT    [name] 05/23/2022, 3:00 PM

## 2022-05-23 NOTE — ED Notes (Signed)
Ice water provided to patient per request. HS medications given. Patient declines additional needs. No distress noted.

## 2022-05-23 NOTE — ED Notes (Signed)
Patient resting in bed with eyes closed Resp even, unlabored. No distress noted at this time.

## 2022-05-23 NOTE — ED Notes (Signed)
Staff walking by room and patient yelling out at this nurse and techs stating he has been asking to be cleaned up. Patient was told again by this nurse that other emergent patients needing staffs attention had to be addressed first and that was the delay. Patient did not seem interested in reasoning and also accusing this nurse of being "mad" at him and also accusing this nurse of not getting him a cup of ice last week when he asked. Patient also bossing/telling this nurse and tech in the room what to do, getting him things, and making comments to this person on facetime how it takes so long to get anyone unless he starts yelling and how people are walking by and not stopping to see what he needs. His call bell is on his bed and and in reach. This pt was cleaned from urine leaking around prima-fit. Dry chucks placed and also a new prima-fit placed. Pt tray placed on BST and he is up in bed on facetime.

## 2022-05-23 NOTE — Progress Notes (Signed)
Occupational Therapy Treatment Patient Details Name: Gary [name] MRN: [id number] DOB: 03-09-1956 Today's Date: 05/23/2022   History of present illness Gary [name] is a 37yoM who comes to [facility] on 04/26/21 reports his self care needs not being met after prior DC, staffing issues in home and reportedly not able to get his medications. Pt was DC from here 10 days prior after admission for respiratory distress, found to have active influenza infection, also CHF exacerbation. APS now involved in pt's case and he is in process of applying for medicare and obtaining facility placement. 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 At baseline, pt is lift transfers and total care +2, nonambulatory despite extensive time working with rehab in acute setting over several months with goals of maximizing independence in bed mobility and transfers.   OT comments  [name] was seen for OT/PT co-treatment on this date. Upon arrival to room pt seated EOB, agreeable to tx. Pt requires MOD A x2 + RW bed>chair transfer with cues for safety. Pt making good progress toward goals, will continue to follow POC. Discharge recommendation remains appropriate.     Recommendations for follow up therapy are one component of a multi-disciplinary discharge planning process, led by the attending physician.  Recommendations may be updated based on patient status, additional functional criteria and insurance authorization.    Follow Up Recommendations  OT at Long-term acute care hospital     Assistance Recommended at Discharge Frequent or constant Supervision/Assistance  Patient can return home with the following  Two people to help with walking and/or transfers;Two people to help with bathing/dressing/bathroom;Assistance with cooking/housework;Help with stairs or ramp for entrance;Assist for transportation   Equipment Recommendations  Hospital bed     Recommendations for Other Services      Precautions / Restrictions Precautions Precautions: Fall Restrictions Weight Bearing Restrictions: No       Mobility Bed Mobility               General bed mobility comments: received sitting    Transfers Overall transfer level: Needs assistance Equipment used: Rolling walker (2 wheels) Transfers: Sit to/from Stand Sit to Stand: Mod assist, +2 physical assistance     Step pivot transfers: Mod assist, +2 physical assistance     General transfer comment: cues for transfer and RW mgmt, pt taking ~4 steps bed>chair     Balance Overall balance assessment: Needs assistance Sitting-balance support: Feet supported Sitting balance-[name] Scale: Good     Standing balance support: Bilateral upper extremity supported, Reliant on assistive device for balance Standing balance-[name] Scale: Poor                             ADL either performed or assessed with clinical judgement   ADL Overall ADL's : Needs assistance/impaired                                       General ADL Comments: MAX A don B shoes in sitting      Cognition Arousal/Alertness: Awake/alert Behavior During Therapy: WFL for tasks assessed/performed Overall Cognitive Status: Within Functional Limits for tasks assessed  Exercises Other Exercises Other Exercises: encouraged LE exercises in the chair            Pertinent Vitals/ Pain       Pain Assessment Pain Assessment: No/denies pain   Frequency  Min 2X/week        Progress Toward Goals  OT Goals(current goals can now be found in the care plan section)  Progress towards OT goals: Progressing toward goals  Acute Rehab OT Goals Patient Stated Goal: to walk OT Goal Formulation: With patient Time For Goal Achievement: 06/04/22 Potential to Achieve Goals: Fair ADL Goals Pt Will Perform Grooming: sitting;with  modified independence Pt Will Perform Lower Body Dressing: with min assist;sitting/lateral leans;with adaptive equipment Pt Will Transfer to Toilet: bedside commode;stand pivot transfer;with min assist;with +2 assist Pt/caregiver will Perform Home Exercise Program: Increased strength;Both right and left upper extremity;With theraband;With written HEP provided;Independently  Plan Discharge plan remains appropriate;Frequency remains appropriate    Co-evaluation    PT/OT/SLP Co-Evaluation/Treatment: Yes Reason for Co-Treatment: For patient/therapist safety;To address functional/ADL transfers PT goals addressed during session: Mobility/safety with mobility OT goals addressed during session: ADL's and self-care      AM-PAC OT "6 Clicks" Daily Activity     Outcome Measure   Help from another person eating meals?: None Help from another person taking care of personal grooming?: A Little Help from another person toileting, which includes using toliet, bedpan, or urinal?: A Lot Help from another person bathing (including washing, rinsing, drying)?: A Lot Help from another person to put on and taking off regular upper body clothing?: A Little Help from another person to put on and taking off regular lower body clothing?: A Lot 6 Click Score: 16    End of Session Equipment Utilized During Treatment: Rolling walker (2 wheels)  OT Visit Diagnosis: Other abnormalities of gait and mobility (R26.89);Muscle weakness (generalized) (M62.81)   Activity Tolerance Patient tolerated treatment well   Patient Left with call bell/phone within reach;in chair   Nurse Communication Mobility status;Need for lift equipment        Time: 6384-6659 OT Time Calculation (min): 12 min  Charges: OT General Charges $OT Visit: 1 Visit OT Treatments $Self Care/Home Management : 8-22 mins  [name], M.S. OTR/L  05/23/22, 2:31 PM  ascom [phone]

## 2022-05-23 NOTE — TOC Progression Note (Signed)
Transition of Care [facility]) - Progression Note    Patient Details  Name: Gary [name] MRN: [id number] Date of Birth: 17-Dec-1955  [facility] CM/SW Contact  Shelbie Hutching, RN Phone Number: 05/23/2022, 2:15 PM  Clinical Narrative:    Per therapy notes patient is making progress and yesterday was able to get to the chair with the walker.  No SNF possibilities at this time.  Asked PT and OT if they could get a weight on him again next time they worked with him.    Expected Discharge Plan: [place] Barriers to Discharge: ED Unsafe disposition  Expected Discharge Plan and Services   Discharge Planning Services: CM Consult Post Acute Care Choice: [place] Living arrangements for the past 2 months: Single Family Home                 DME Arranged: N/A DME Agency: NA                   Social Determinants of Health (SDOH) Interventions SDOH Screenings   Food Insecurity: No Food Insecurity (04/15/2022)  Housing: Low Risk  (04/15/2022)  Transportation Needs: Unmet Transportation Needs (04/15/2022)  Utilities: Not At Risk (04/15/2022)  Recent Concern: Utilities - At Risk (02/05/2022)  Depression (PHQ2-9): Low Risk  (06/07/2021)  Financial Resource Strain: Medium Risk (05/03/2017)  Physical Activity: Insufficiently Active (05/03/2017)  Social Connections: Moderately Integrated (05/03/2017)  Stress: No Stress Concern Present (05/03/2017)  Tobacco Use: Medium Risk (04/26/2022)    Readmission Risk Interventions    12/31/2021    8:58 AM 12/20/2021    4:21 PM 05/04/2021    1:26 PM  Readmission Risk Prevention Plan  Transportation Screening Complete Complete Complete  Medication Review Press photographer) Complete Complete Complete  PCP or Specialist appointment within 3-5 days of discharge Complete Complete Complete  HRI or Home Care Consult Complete Complete Complete  SW Recovery Care/Counseling Consult Complete Complete Complete   Palliative Care Screening Complete Complete Not [place] Not Applicable Not Applicable Not Applicable

## 2022-05-24 NOTE — ED Provider Notes (Signed)
Emergency Medicine Observation Re-evaluation Note  Gary [name] is a 67 y.o. male, seen in the emergency department.  No acute events since last update Physical Exam  BP 133/73 (BP Location: Right Arm)   Pulse 78   Temp 98 F (36.7 C) (Oral)   Resp 20   Ht '6\' 3"'$  (1.905 m)   Wt (!) 177.4 kg   SpO2 96%   BMI 48.87 kg/m    ED Course / MDM   No recent lab work for review  Plan  Current plan is for placement to an appropriate living facility once available.Harvest Dark, MD 05/24/22 [phone]

## 2022-05-24 NOTE — ED Notes (Signed)
This RN with the help of Merleen Nicely, RN put the pt on a bedpan.

## 2022-05-24 NOTE — ED Notes (Signed)
Pt assisted to use bedpan.  Pt had BM and was cleaned with wipes.  New pad placed under pt and suction cannister of urine emptied. Meal tray placed on table besides pts bed.  Call light within reach.  No other needs identified at this time.

## 2022-05-24 NOTE — Progress Notes (Signed)
Physical Therapy Treatment Patient Details Name: Gary [name] MRN: [id number] DOB: January 30, 1956 Today's Date: 05/24/2022   History of Present Illness Gary [name] is a 12yoM who comes to [facility] on 04/26/21 reports his self care needs not being met after prior DC, staffing issues in home and reportedly not able to get his medications. Pt was DC from here 10 days prior after admission for respiratory distress, found to have active influenza infection, also CHF exacerbation. APS now involved in pt's case and he is in process of applying for medicare and obtaining facility placement. 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 At baseline, pt is lift transfers and total care +2, nonambulatory despite extensive time working with rehab in acute setting over several months with goals of maximizing independence in bed mobility and transfers.    PT Comments    Patient is cooperative throughout session. He walked today, 67f with the bariatric rolling walker and chair following for safety. Min A +2 person provided for occasional assistance with negotiation of rolling walker during ambulation. He is making progress with functional independence and with increased activity tolerance. PT will continue to follow to maximize independence and facilitate return to prior level of function.      Recommendations for follow up therapy are one component of a multi-disciplinary discharge planning process, led by the attending physician.  Recommendations may be updated based on patient status, additional functional criteria and insurance authorization.  Follow Up Recommendations   (LTC with therapy follow up) Can patient physically be transported by private vehicle: No   Assistance Recommended at Discharge Intermittent Supervision/Assistance  Patient can return home with the following Two people to help with bathing/dressing/bathroom;Assistance with  cooking/housework;Assist for transportation;Help with stairs or ramp for entrance;Two people to help with walking and/or transfers;Direct supervision/assist for medications management;Direct supervision/assist for financial management   Equipment Recommendations  None recommended by PT    Recommendations for Other Services       Precautions / Restrictions Precautions Precautions: Fall Restrictions Weight Bearing Restrictions: No     Mobility  Bed Mobility Overal bed mobility: Needs Assistance Bed Mobility: Supine to Sit     Supine to sit: Min assist, HOB elevated     General bed mobility comments: assistance for LE support. increased time and effort required    Transfers Overall transfer level: Needs assistance Equipment used: Rolling walker (2 wheels) Transfers: Sit to/from Stand Sit to Stand: Min assist, +2 physical assistance           General transfer comment: lifting assistance required for standing. verbal cues for technique, anterior weight shifting. the mechanical lift was used to assist patient from recliner chair to bed after ambulation as patient has been unable to stand from the low recliner height    Ambulation/Gait Ambulation/Gait assistance: Min assist, +2 physical assistance, +2 safety/equipment Gait Distance (Feet): 7 Feet Assistive device: Rolling walker (2 wheels) (bariatric rolling walker) Gait Pattern/deviations: Step-through pattern Gait velocity: decreased     General Gait Details: patient walked 768fwith chair follow for safety. intermittent cues for step initiation and assistance required for negotiation of rolling walker   Stairs             Wheelchair Mobility    Modified Rankin (Stroke Patients Only)       Balance Overall balance assessment: Needs assistance Sitting-balance support: Feet supported Sitting balance-[name] Scale: Good     Standing balance support: Bilateral upper  extremity supported Standing  balance-[name] Scale: Fair                              Cognition Arousal/Alertness: Awake/alert Behavior During Therapy: WFL for tasks assessed/performed Overall Cognitive Status: Within Functional Limits for tasks assessed                                          Exercises General Exercises - Lower Extremity Long Arc Quad: AROM, AAROM, Strengthening, Both, 10 reps, Seated (AAROM for 1 set of 10 and AROM for 1 set of 10) Other Exercises Other Exercises: encouraged exercises as able for strengthening in preparation for standing/ambulation activity    General Comments        Pertinent Vitals/Pain Pain Assessment Pain Assessment: No/denies pain    Home Living                          Prior Function            PT Goals (current goals can now be found in the care plan section) Acute Rehab PT Goals Patient Stated Goal: to be walking within a month PT Goal Formulation: With patient Time For Goal Achievement: 06/07/22 Potential to Achieve Goals: Fair Progress towards PT goals: Progressing toward goals    Frequency    Min 2X/week      PT Plan Current plan remains appropriate (extended care plan x 2 week, making progress towards goals)    Co-evaluation PT/OT/SLP Co-Evaluation/Treatment: Yes Reason for Co-Treatment: For patient/therapist safety;To address functional/ADL transfers PT goals addressed during session: Mobility/safety with mobility OT goals addressed during session: ADL's and self-care      AM-PAC PT "6 Clicks" Mobility   Outcome Measure  Help needed turning from your back to your side while in a flat bed without using bedrails?: A Lot Help needed moving from lying on your back to sitting on the side of a flat bed without using bedrails?: Total Help needed moving to and from a bed to a chair (including a wheelchair)?: Total Help needed standing up from a chair using your arms (e.g., wheelchair or bedside chair)?:  Total Help needed to walk in hospital room?: Total Help needed climbing 3-5 steps with a railing? : Total 6 Click Score: 7    End of Session Equipment Utilized During Treatment: Oxygen (trach mask oxygen) Activity Tolerance: Patient tolerated treatment well Patient left: in bed;with call bell/phone within reach;with bed alarm set   PT Visit Diagnosis: Unsteadiness on feet (R26.81);Muscle weakness (generalized) (M62.81);Difficulty in walking, not elsewhere classified (R26.2)     Time: 1358-1450 PT Time Calculation (min) (ACUTE ONLY): 52 min  Charges:  $Gait Training: 8-22 mins $Therapeutic Activity: 8-22 mins                    [name], PT, MPT    [name] 05/24/2022, 3:48 PM

## 2022-05-24 NOTE — ED Notes (Signed)
This RN removed the bedpan, provided perineal care and returned the pt's bed to a low, locked position. Pt denied any additional needs at this time.

## 2022-05-24 NOTE — Progress Notes (Signed)
Patient assisted with home vent for HS, with 3L O2 bleed. Patient placed on continuous monitoring. Stable and tolerating well at this time.

## 2022-05-24 NOTE — Progress Notes (Signed)
Occupational Therapy Treatment Patient Details Name: Gary [name] MRN: [id number] DOB: 1956/04/01 Today's Date: 05/24/2022   History of present illness Gary Forrester is a 40yoM who comes to [facility] on 04/26/21 reports his self care needs not being met after prior DC, staffing issues in home and reportedly not able to get his medications. Pt was DC from here 10 days prior after admission for respiratory distress, found to have active influenza infection, also CHF exacerbation. APS now involved in pt's case and he is in process of applying for medicare and obtaining facility placement. 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 At baseline, pt is lift transfers and total care +2, nonambulatory despite extensive time working with rehab in acute setting over several months with goals of maximizing independence in bed mobility and transfers.   OT comments  Gary [name] was seen for OT/PT co-treatment on this date. Upon arrival to room pt seated EOB, agreeable to tx. Pt requires MAX A don B shoes in sitting. MIN A x2 + RW sit<>stand and ~7 ft mobility, assist to advance RW. Pt making good progress toward goals, will continue to follow POC. Discharge recommendation remains appropriate.     Recommendations for follow up therapy are one component of a multi-disciplinary discharge planning process, led by the attending physician.  Recommendations may be updated based on patient status, additional functional criteria and insurance authorization.    Follow Up Recommendations  OT at Long-term acute care hospital     Assistance Recommended at Discharge Frequent or constant Supervision/Assistance  Patient can return home with the following  Two people to help with walking and/or transfers;Two people to help with bathing/dressing/bathroom;Assistance with cooking/housework;Help with stairs or ramp for entrance;Assist for transportation   Equipment  Recommendations  Hospital bed    Recommendations for Other Services      Precautions / Restrictions Precautions Precautions: Fall Restrictions Weight Bearing Restrictions: No       Mobility Bed Mobility               General bed mobility comments: received sitting    Transfers Overall transfer level: Needs assistance Equipment used: Rolling walker (2 wheels) Transfers: Sit to/from Stand Sit to Stand: +2 physical assistance, Min assist           General transfer comment: assist to advance RW for ~7 feet mobility     Balance Overall balance assessment: Needs assistance Sitting-balance support: Feet supported Sitting balance-[name] Scale: Good     Standing balance support: Bilateral upper extremity supported, Reliant on assistive device for balance Standing balance-[name] Scale: Fair                             ADL either performed or assessed with clinical judgement   ADL Overall ADL's : Needs assistance/impaired                                       General ADL Comments: MAX A don B shoes in sitting. MIN A x2 + RW simulated BSC t/f      Cognition Arousal/Alertness: Awake/alert Behavior During Therapy: WFL for tasks assessed/performed Overall Cognitive Status: Within Functional Limits for tasks assessed  Pertinent Vitals/ Pain       Pain Assessment Pain Assessment: No/denies pain   Frequency  Min 2X/week        Progress Toward Goals  OT Goals(current goals can now be found in the care plan section)  Progress towards OT goals: Progressing toward goals  Acute Rehab OT Goals Patient Stated Goal: to walk OT Goal Formulation: With patient Time For Goal Achievement: 06/04/22 Potential to Achieve Goals: Fair ADL Goals Pt Will Perform Grooming: sitting;with modified independence Pt Will Perform Lower Body Dressing: with min  assist;sitting/lateral leans;with adaptive equipment Pt Will Transfer to Toilet: bedside commode;stand pivot transfer;with min assist;with +2 assist Pt/caregiver will Perform Home Exercise Program: Increased strength;Both right and left upper extremity;With theraband;With written HEP provided;Independently  Plan Discharge plan remains appropriate;Frequency remains appropriate    Co-evaluation    PT/OT/SLP Co-Evaluation/Treatment: Yes Reason for Co-Treatment: For patient/therapist safety;To address functional/ADL transfers PT goals addressed during session: Mobility/safety with mobility OT goals addressed during session: ADL's and self-care      AM-PAC OT "6 Clicks" Daily Activity     Outcome Measure   Help from another person eating meals?: None Help from another person taking care of personal grooming?: A Little Help from another person toileting, which includes using toliet, bedpan, or urinal?: A Lot Help from another person bathing (including washing, rinsing, drying)?: A Lot Help from another person to put on and taking off regular upper body clothing?: A Little Help from another person to put on and taking off regular lower body clothing?: A Lot 6 Click Score: 16    End of Session Equipment Utilized During Treatment: Rolling walker (2 wheels)  OT Visit Diagnosis: Other abnormalities of gait and mobility (R26.89);Muscle weakness (generalized) (M62.81)   Activity Tolerance Patient tolerated treatment well   Patient Left in bed;with call bell/phone within reach   Nurse Communication          Time: 1412-1450 OT Time Calculation (min): 38 min  Charges: OT General Charges $OT Visit: 1 Visit OT Treatments $Self Care/Home Management : 8-22 mins  [name], M.S. OTR/L  05/24/22, 3:15 PM  ascom [phone]

## 2022-05-25 NOTE — Progress Notes (Signed)
Occupational Therapy Treatment Patient Details Name: Gary [name] MRN: [id number] DOB: Nov 24, 1955 Today's Date: 05/25/2022   History of present illness Gary [name] is a 24yoM who comes to [facility] on 04/26/21 reports his self care needs not being met after prior DC, staffing issues in home and reportedly not able to get his medications. Pt was DC from here 10 days prior after admission for respiratory distress, found to have active influenza infection, also CHF exacerbation. APS now involved in pt's case and he is in process of applying for medicare and obtaining facility placement. 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 At baseline, pt is lift transfers and total care +2, nonambulatory despite extensive time working with rehab in acute setting over several months with goals of maximizing independence in bed mobility and transfers.   OT comments  Gary [name] was seen for OT/PT co-treatment on this date. Upon arrival to room pt reclined in bed, agreeable to tx. Pt requires MIN A x2 + RW sit<>stand from max elevated bed height. MIN A + RW, assist to advance RW for ~12 feet, +2 for safety/chair follow. Pt initiated x2 standing rest breaks. Pt states unable to attempt standing from low chair height, plan to trial standing from lower bed height next session. Pt making good progress toward goals, will continue to follow POC. Discharge recommendation remains appropriate.     Recommendations for follow up therapy are one component of a multi-disciplinary discharge planning process, led by the attending physician.  Recommendations may be updated based on patient status, additional functional criteria and insurance authorization.    Follow Up Recommendations  OT at Long-term acute care hospital     Assistance Recommended at Discharge Frequent or constant Supervision/Assistance  Patient can return home with the following  Two people to  help with walking and/or transfers;Two people to help with bathing/dressing/bathroom;Assistance with cooking/housework;Help with stairs or ramp for entrance;Assist for transportation   Equipment Recommendations  Hospital bed    Recommendations for Other Services      Precautions / Restrictions Precautions Precautions: Fall Restrictions Weight Bearing Restrictions: No       Mobility Bed Mobility Overal bed mobility: Needs Assistance Bed Mobility: Supine to Sit, Sit to Supine Rolling: Mod assist   Supine to sit: Min assist Sit to supine: Max assist        Transfers Overall transfer level: Needs assistance Equipment used: Rolling walker (2 wheels) Transfers: Sit to/from Stand Sit to Stand: Min assist, +2 safety/equipment           General transfer comment: assist to advance RW for ~12 feet. x2 standing rest breaks     Balance Overall balance assessment: Needs assistance Sitting-balance support: Feet supported Sitting balance-[name] Scale: Good     Standing balance support: Bilateral upper extremity supported, Reliant on assistive device for balance Standing balance-[name] Scale: Fair                             ADL either performed or assessed with clinical judgement   ADL Overall ADL's : Needs assistance/impaired                                       General ADL Comments: MAX A don B shoes in sitting. MIN A x2 + RW simulated toilet t/f  Cognition Arousal/Alertness: Awake/alert Behavior During Therapy: WFL for tasks assessed/performed Overall Cognitive Status: Within Functional Limits for tasks assessed                                                     Pertinent Vitals/ Pain       Pain Assessment Pain Assessment: No/denies pain   Frequency  Min 2X/week        Progress Toward Goals  OT Goals(current goals can now be found in the care plan section)  Progress towards OT goals: Progressing  toward goals  Acute Rehab OT Goals Patient Stated Goal: to walk OT Goal Formulation: With patient Time For Goal Achievement: 06/04/22 Potential to Achieve Goals: Fair ADL Goals Pt Will Perform Grooming: sitting;with modified independence Pt Will Perform Lower Body Dressing: with min assist;sitting/lateral leans;with adaptive equipment Pt Will Transfer to Toilet: bedside commode;stand pivot transfer;with min assist;with +2 assist Pt/caregiver will Perform Home Exercise Program: Increased strength;Both right and left upper extremity;With theraband;With written HEP provided;Independently  Plan Discharge plan remains appropriate;Frequency remains appropriate    Co-evaluation    PT/OT/SLP Co-Evaluation/Treatment: Yes Reason for Co-Treatment: For patient/therapist safety;To address functional/ADL transfers PT goals addressed during session: Mobility/safety with mobility OT goals addressed during session: ADL's and self-care      AM-PAC OT "6 Clicks" Daily Activity     Outcome Measure   Help from another person eating meals?: None Help from another person taking care of personal grooming?: A Little Help from another person toileting, which includes using toliet, bedpan, or urinal?: A Lot Help from another person bathing (including washing, rinsing, drying)?: A Lot Help from another person to put on and taking off regular upper body clothing?: A Little Help from another person to put on and taking off regular lower body clothing?: A Lot 6 Click Score: 16    End of Session Equipment Utilized During Treatment: Rolling walker (2 wheels)  OT Visit Diagnosis: Other abnormalities of gait and mobility (R26.89);Muscle weakness (generalized) (M62.81)   Activity Tolerance Patient tolerated treatment well   Patient Left in bed;with call bell/phone within reach   Nurse Communication          Time: [id number] OT Time Calculation (min): 33 min  Charges: OT General Charges $OT Visit: 1  Visit OT Treatments $Self Care/Home Management : 8-22 mins  [name], M.S. OTR/L  05/25/22, 2:40 PM  ascom [phone]

## 2022-05-25 NOTE — ED Notes (Addendum)
Pt got a complete bed bath with linen change, Pt was also had his face shaved per his request.

## 2022-05-25 NOTE — Progress Notes (Signed)
Patient assisted with coming off of his home vent and placing himself on 28% ATM.

## 2022-05-25 NOTE — Progress Notes (Signed)
Physical Therapy Treatment Patient Details Name: Gary [name] MRN: [id number] DOB: Jan 06, 1956 Today's Date: 05/25/2022   History of Present Illness Gary [name] is a 89yoM who comes to [facility] on 04/26/21 reports his self care needs not being met after prior DC, staffing issues in home and reportedly not able to get his medications. Pt was DC from here 10 days prior after admission for respiratory distress, found to have active influenza infection, also CHF exacerbation. APS now involved in pt's case and he is in process of applying for medicare and obtaining facility placement. 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 At baseline, pt is lift transfers and total care +2, nonambulatory despite extensive time working with rehab in acute setting over several months with goals of maximizing independence in bed mobility and transfers.    PT Comments    Pt was long sitting in bed upon arriving. PT/OT co-treat 2/2 to pt requiring +2 assistance for safety. Pt was agreeable and remained motivated throughout. He was able to exit bed, stand to bariatric RW and tolerate ambulation 12 ft. Chair follow for safety. Pt still required elevated bed height to stand from and was unwilling to attempt standing from lower recliner surface. Hoyer lift transferred back into bed at conclusion of session.  Pt is progressing well towards goals. Acute PT will continue to follow and progress per current POC.   Recommendations for follow up therapy are one component of a multi-disciplinary discharge planning process, led by the attending physician.  Recommendations may be updated based on patient status, additional functional criteria and insurance authorization.  Follow Up Recommendations  Long-term institutional care without follow-up therapy     Assistance Recommended at Discharge Intermittent Supervision/Assistance  Patient can return home with the  following Two people to help with bathing/dressing/bathroom;Assistance with cooking/housework;Assist for transportation;Help with stairs or ramp for entrance;Two people to help with walking and/or transfers;Direct supervision/assist for medications management;Direct supervision/assist for financial management   Equipment Recommendations  None recommended by PT       Precautions / Restrictions Precautions Precautions: Fall Restrictions Weight Bearing Restrictions: No RLE Weight Bearing: Weight bearing as tolerated     Mobility  Bed Mobility Overal bed mobility: Needs Assistance Bed Mobility: Supine to Sit, Sit to Supine Rolling: Mod assist Supine to sit: Min assist Sit to supine: Max assist     Transfers Overall transfer level: Needs assistance Equipment used: Rolling walker (2 wheels) (Green bariatric RW) Transfers: Sit to/from Stand Sit to Stand: Min assist, +2 safety/equipment    General transfer comment: assist to advance RW for ~12 feet. x2 standing rest breaks    Ambulation/Gait Ambulation/Gait assistance: Min assist, +2 physical assistance, +2 safety/equipment Gait Distance (Feet): 12 Feet Assistive device: Rolling walker (2 wheels) (green bariatric RW) Gait Pattern/deviations: Step-through pattern Gait velocity: decreased     General Gait Details: pt was able to ambulate into hallway of ED with chair follow. During gait 12 ft, pt stood and took 2 standing rest breaks. Overall tolerated well. O2 stable without trach collar donned during gait per pt request. Pt unwilling to attempt standing from lower recliner surface. Require hoyer lift back to bed form recliner.    Balance Overall balance assessment: Needs assistance Sitting-balance support: Feet supported Sitting balance-[name] Scale: Good     Standing balance support: Bilateral upper extremity supported, Reliant on assistive device for balance Standing balance-[name] Scale: Payne  Arousal/Alertness: Awake/alert Behavior During Therapy: WFL for tasks assessed/performed Overall Cognitive Status: Within Functional Limits for tasks assessed    Following Commands: Follows one step commands consistently Safety/Judgement: Decreased awareness of deficits, Decreased awareness of safety     General Comments: Pt continues to be cooperative and motivated           General Comments General comments (skin integrity, edema, etc.): Reviewed need for attempting standing from lower surfaces. pt states he will from EOB surface. Also reviewed need to continue ther ex in bed. pt states understanding. OT discussed breaking PT/OT into two seperate sessions in th future.      Pertinent Vitals/Pain Pain Assessment Pain Assessment: No/denies pain     PT Goals (current goals can now be found in the care plan section) Acute Rehab PT Goals Patient Stated Goal: to be walking within a month Progress towards PT goals: Progressing toward goals    Frequency    Min 2X/week      PT Plan Current plan remains appropriate    Co-evaluation   Reason for Co-Treatment: For patient/therapist safety;To address functional/ADL transfers PT goals addressed during session: Mobility/safety with mobility OT goals addressed during session: ADL's and self-care      AM-PAC PT "6 Clicks" Mobility   Outcome Measure  Help needed turning from your back to your side while in a flat bed without using bedrails?: A Lot Help needed moving from lying on your back to sitting on the side of a flat bed without using bedrails?: Total Help needed moving to and from a bed to a chair (including a wheelchair)?: Total Help needed standing up from a chair using your arms (e.g., wheelchair or bedside chair)?: Total Help needed to walk in hospital room?: Total Help needed climbing 3-5 steps with a railing? : Total 6 Click Score: 7    End of Session Equipment Utilized During Treatment:  (trach collar donned  on/off during session per pt preference) Activity Tolerance: Patient tolerated treatment well Patient left: in bed;with call bell/phone within reach;with bed alarm set Nurse Communication: Mobility status;Need for lift equipment PT Visit Diagnosis: Unsteadiness on feet (R26.81);Muscle weakness (generalized) (M62.81);Difficulty in walking, not elsewhere classified (R26.2)     Time: [id number] PT Time Calculation (min) (ACUTE ONLY): 33 min  Charges:  $Gait Training: 8-22 mins                     Julaine Fusi PTA 05/25/22, 3:09 PM

## 2022-05-25 NOTE — ED Provider Notes (Signed)
-----------------------------------------   5:41 AM on 05/25/2022 -----------------------------------------   Blood pressure 133/85, pulse 84, temperature 98.2 F (36.8 C), temperature source Oral, resp. rate 20, height '6\' 3"'$  (1.905 m), weight (!) 177.4 kg, SpO2 94 %.  The patient is calm and cooperative at this time.  There have been no acute events since the last update.  Awaiting disposition plan from Social Work team.   Paulette Blanch, MD 05/25/22 [phone]

## 2022-05-26 NOTE — ED Notes (Signed)
Pt given saltine crackers and ginger ale. Urine container emptied. Pt expresses no other needs at this time.

## 2022-05-26 NOTE — ED Notes (Signed)
Pt called out for BM. Pt was cleaned and repositioned in bed. Pt denies any other needs at this time.

## 2022-05-26 NOTE — Progress Notes (Signed)
Patient off home vent and back on 28% trach collar. Patient performed self trach care prior to vent at bedtime. No distress noted.

## 2022-05-26 NOTE — ED Notes (Signed)
Pt given some juice and water per pt request.

## 2022-05-27 NOTE — Progress Notes (Signed)
Patient off home vent and transitioned back to 28% trach collar. Patient tolerated home equipment well during the night. No distress on trach collar. Will continue to monitor.

## 2022-05-28 MED ORDER — TORSEMIDE 20 MG PO TABS
20.0000 mg | ORAL_TABLET | ORAL | Status: AC
  Administered 2022-05-28: 20 mg via ORAL
  Filled 2022-05-28: qty 1

## 2022-05-28 MED ORDER — ACETAMINOPHEN 325 MG PO TABS
650.0000 mg | ORAL_TABLET | Freq: Once | ORAL | Status: AC
  Administered 2022-05-28: 650 mg via ORAL
  Filled 2022-05-28: qty 2

## 2022-05-28 NOTE — Progress Notes (Signed)
Physical Therapy Treatment Patient Details Name: Gary [name] MRN: [id number] DOB: [birth year] Today's Date: 05/28/2022   History of Present Illness Gary [name] is a 39yoM who comes to [facility] on 04/26/21 reports his self care needs not being met after prior DC, staffing issues in home and reportedly not able to get his medications. Pt was DC from here 10 days prior after admission for respiratory distress, found to have active influenza infection, also CHF exacerbation. APS now involved in pt's case and he is in process of applying for medicare and obtaining facility placement. 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 At baseline, pt is lift transfers and total care +2, nonambulatory despite extensive time working with rehab in acute setting over several months with goals of maximizing independence in bed mobility and transfers.    PT Comments    Patient reports feeling stiffness all over and complains of pain at the top of left foot. Ace wrap provided for compression/comfort with standing efforts per patient request. Patient performed standing x  1 bout with limited standing tolerance secondary to L foot pain. Unable/unwilling to attempt taking steps this morning due to L foot pain. Highly encouraged patient to continue with HEP daily and routine OOB activity for carry over of therapy progress throughout the week. PT will continue to follow to maximize independence.    Recommendations for follow up therapy are one component of a multi-disciplinary discharge planning process, led by the attending physician.  Recommendations may be updated based on patient status, additional functional criteria and insurance authorization.  Follow Up Recommendations   (LTC with therapy follow up) Can patient physically be transported by private vehicle: No   Assistance Recommended at Discharge Intermittent Supervision/Assistance  Patient can  return home with the following Two people to help with bathing/dressing/bathroom;Assistance with cooking/housework;Assist for transportation;Help with stairs or ramp for entrance;Two people to help with walking and/or transfers;Direct supervision/assist for medications management;Direct supervision/assist for financial management   Equipment Recommendations  None recommended by PT    Recommendations for Other Services       Precautions / Restrictions Precautions Precautions: Fall Restrictions Weight Bearing Restrictions: No     Mobility  Bed Mobility Overal bed mobility: Needs Assistance Bed Mobility: Supine to Sit, Sit to Supine     Supine to sit: Min assist Sit to supine: Max assist   General bed mobility comments: assistance for LE support with occasionl trunk support needed to sit upright. increased support required to return to bed due to fatigue with activity    Transfers Overall transfer level: Needs assistance Equipment used: Rolling walker (2 wheels) (bariatric) Transfers: Sit to/from Stand Sit to Stand: Min assist, +2 physical assistance, From elevated surface           General transfer comment: bed height elevated. emphasis on LE positioning, anterior weight shifting, and using LE muscle strength to faciliate standing from bed. 1 standing bout performed with standing tolerance limited by L foot pain    Ambulation/Gait               General Gait Details: unable to this morning due to L foot pain   Stairs             Wheelchair Mobility    Modified Rankin (Stroke Patients Only)       Balance Overall balance assessment: Needs assistance Sitting-balance support: Feet supported Sitting balance-[name] Scale: Good  Cognition Arousal/Alertness: Awake/alert Behavior During Therapy: WFL for tasks assessed/performed Overall Cognitive Status: Within Functional Limits for tasks assessed                                           Exercises General Exercises - Lower Extremity Ankle Circles/Pumps: AROM, Strengthening, 10 reps, Supine Heel Slides: AAROM, Strengthening, Both, 10 reps, Supine Other Exercises Other Exercises: PNF pattern AROM of BUE performed x 5 reps before patient needing to return to bed due to "slipping off" the bed. encouraged AROM of BLE while in bed with LLE elevated on pillow for comfort    General Comments General comments (skin integrity, edema, etc.): patient reports feeling stiffness all over with no mobility over the weekend. highly encouraged and educated patient on the importance to continue HEP daily, routine OOB to chair mobility with/without the lift for upright conditioning and to promote independence with functional activity      Pertinent Vitals/Pain Pain Assessment Pain Assessment: Faces Faces Pain Scale: Hurts even more Pain Location: top of L foot Pain Descriptors / Indicators: Sore Pain Intervention(s): Limited activity within patient's tolerance, Monitored during session, Repositioned (ace wrap compression provided during mobility for comfort per patient request)    Home Living                          Prior Function            PT Goals (current goals can now be found in the care plan section) Acute Rehab PT Goals Patient Stated Goal: to be walking within a month PT Goal Formulation: With patient Time For Goal Achievement: 06/07/22 Potential to Achieve Goals: Fair Progress towards PT goals: Progressing toward goals    Frequency    Min 2X/week      PT Plan Current plan remains appropriate    Co-evaluation              AM-PAC PT "6 Clicks" Mobility   Outcome Measure  Help needed turning from your back to your side while in a flat bed without using bedrails?: A Lot Help needed moving from lying on your back to sitting on the side of a flat bed without using bedrails?: Total Help needed  moving to and from a bed to a chair (including a wheelchair)?: Total Help needed standing up from a chair using your arms (e.g., wheelchair or bedside chair)?: Total Help needed to walk in hospital room?: Total Help needed climbing 3-5 steps with a railing? : Total 6 Click Score: 7    End of Session Equipment Utilized During Treatment: Oxygen (trach mask, PMSV in place) Activity Tolerance: Patient tolerated treatment well;Patient limited by pain Patient left: in bed;with call bell/phone within reach   PT Visit Diagnosis: Unsteadiness on feet (R26.81);Muscle weakness (generalized) (M62.81);Difficulty in walking, not elsewhere classified (R26.2)     Time: [id number] PT Time Calculation (min) (ACUTE ONLY): 45 min  Charges:  $Therapeutic Exercise: 8-22 mins $Therapeutic Activity: 23-37 mins                    [name], PT, MPT    [name] 05/28/2022, 11:46 AM

## 2022-05-28 NOTE — ED Notes (Addendum)
This RN answered patient's call light. Patient Gary [name] with even unlabored respirations talking on telephone. Patient states urine collection system about to run over. 850 ml urine emptied from cannister. Patient clean and dry. Patient declines additional needs.

## 2022-05-28 NOTE — ED Provider Notes (Signed)
Emergency Medicine Observation Re-evaluation Note  Gary [name] is a 67 y.o. male, seen on rounds today.  Pt initially presented to the ED for complaints of Abdominal Pain  Currently, the patient is calm, no acute complaints.  Physical Exam  Blood pressure 105/69, pulse 84, temperature 98.4 F (36.9 C), temperature source Oral, resp. rate 18, height 6' 3"$  (1.905 m), weight (!) 177.4 kg, SpO2 95 %. Physical Exam General: NAD Lungs: CTAB Psych: not agitated  ED Course / MDM  EKG:    I have reviewed the labs performed to date as well as medications administered while in observation.  Recent changes in the last 24 hours include no acute events overnight.  Patient does complain of increased bilateral lower extremity edema today which he believes is related to his CHF.  This happens at home he was taken extra dose of his torsemide and it resolves.  He denies any pain.  No chest pain or shortness of breath or fevers or chills.  On exam, he does have 1+ pitting edema bilateral lower extremities, calf circumference somewhat greater on the right compared to left, but no tenderness.  Negative Homans' sign.  No inflammatory skin changes.  Will plan to give an additional dose of torsemide 1 time today.  I recommended obtaining bilateral lower extremity ultrasounds to rule out DVT even though he is on anticoagulation, patient refuses, citing his certainty that this is related to his chronic edema and that it will resolve with torsemide.  Plan  Current plan is for extra dose of torsemide x 1 today, awaiting social work placement.   Carrie Mew, MD 05/28/22 [phone]

## 2022-05-28 NOTE — TOC Progression Note (Signed)
Transition of Care [facility]) - Progression Note    Patient Details  Name: Gary [name] MRN: [id number] Date of Birth: [birth year]  Transition of Care [facility] CM/SW Contact  Shelbie Hutching, RN Phone Number: 05/28/2022, 2:12 PM  Clinical Narrative:    Referral sent to [facility] in [place] for Ventilatory Management and to [facility] in [place]- emailed both referrals to admissions at respective companies.  [facility] has 4 facilities with Vent units.      Expected Discharge Plan: [place] Barriers to Discharge: ED Unsafe disposition  Expected Discharge Plan and Services   Discharge Planning Services: CM Consult Post Acute Care Choice: [place] Living arrangements for the past 2 months: Single Family Home                 DME Arranged: N/A DME Agency: NA                   Social Determinants of Health (SDOH) Interventions SDOH Screenings   Food Insecurity: No Food Insecurity (04/15/2022)  Housing: Low Risk  (04/15/2022)  Transportation Needs: Unmet Transportation Needs (04/15/2022)  Utilities: Not At Risk (04/15/2022)  Recent Concern: Utilities - At Risk (02/05/2022)  Depression (PHQ2-9): Low Risk  (06/07/2021)  Financial Resource Strain: Medium Risk (05/03/2017)  Physical Activity: Insufficiently Active (05/03/2017)  Social Connections: Moderately Integrated (05/03/2017)  Stress: No Stress Concern Present (05/03/2017)  Tobacco Use: Medium Risk (04/26/2022)    Readmission Risk Interventions    12/31/2021    8:58 AM 12/20/2021    4:21 PM 05/04/2021    1:26 PM  Readmission Risk Prevention Plan  Transportation Screening Complete Complete Complete  Medication Review Press photographer) Complete Complete Complete  PCP or Specialist appointment within 3-5 days of discharge Complete Complete Complete  HRI or Home Care Consult Complete Complete Complete  SW Recovery Care/Counseling Consult Complete Complete Complete  Palliative Care Screening  Complete Complete Not [place] Not Applicable Not Applicable Not Applicable

## 2022-05-28 NOTE — NC FL2 (Signed)
[place] LEVEL OF CARE FORM     IDENTIFICATION  Patient Name: Gary [name] Birthdate: 1956-03-17 Sex: male Admission Date (Current Location): 04/26/2022  [facility] and [place] Number:  Engineering geologist and Address:  [facility], [address], [place], [place] [address]      Provider Number: [id number]  Attending Physician Name and Address:  No att. providers found  Relative Name and Phone Number:  [name] - sister- [phone]    Current Level of Care:  (ED boarder for placement - rehab) Recommended Level of Care: [place] Prior Approval Number:    Date Approved/Denied:   PASRR Number: [id number]  Discharge Plan: SNF    Current Diagnoses: Patient Active Problem List   Diagnosis Date Noted   Acute exacerbation of CHF (congestive heart failure) ([place]) 04/11/2022   Influenza A 04/11/2022   Hypokalemia 03/06/2022   Acute on chronic diastolic (congestive) heart failure ([place]) 02/03/2022   Chronic obstructive pulmonary disease (COPD) ([place]) 02/03/2022   GERD without esophagitis 02/03/2022   Anxiety and depression 02/03/2022   Dyslipidemia 02/03/2022   Bloody stool 11/12/2021   Major depressive disorder, recurrent episode, moderate ([place]) 10/22/2021   Pressure injury of skin 09/27/2021   Acute on chronic respiratory failure with hypercapnia ([place]) 08/07/2021   COPD (chronic obstructive pulmonary disease) ([place])    HLD (hyperlipidemia)    Iron deficiency anemia    Chronic kidney disease, stage 3a ([place]) 07/22/2021   Acute on chronic respiratory failure with hypoxia and hypercapnia ([place]) 07/20/2021   Chronic obstructive pulmonary disease ([place])    Acute CHF (congestive heart failure) ([place]) 04/04/2021   Weakness    Acute respiratory failure with hypoxia and hypercapnia ([place]) 03/11/2021   (HFpEF) heart failure with preserved ejection fraction ([name])    Chronic respiratory failure with hypoxia ([place])     Hyperlipidemia 02/19/2021   Acute respiratory distress syndrome (ARDS) due to COVID-19 virus ([place])    Acute on chronic respiratory failure ([place]) 02/18/2021   Generalized osteoarthritis of multiple sites 10/31/2020   AKI (acute kidney injury) ([place]) 03/04/2020   Hyperkalemia 03/04/2020   COPD with acute exacerbation ([place]) 03/02/2020   Marijuana use 01/17/2020   Thrombocytopenia ([place]) 01/17/2020   Positive hepatitis C antibody test 01/17/2020   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 mass index (BMI) of 50.0 to 59.9 in adult [facility]) 01/17/2020   Osteoarthritis of glenohumeral joints (Bilateral) 01/17/2020   Osteoarthritis of AC (acromioclavicular) joints (Bilateral) 01/17/2020   Polysubstance abuse ([place]) [id number]   Toxic metabolic encephalopathy [id number]   Chronic pain syndrome 07/14/2019   DDD (degenerative disc disease), lumbosacral 07/14/2019   DDD (degenerative disc disease), cervical 07/14/2019   CHF (congestive heart failure) ([place]) 04/12/2017   Acute on chronic diastolic CHF (congestive heart failure) ([place]) 02/28/2015   Obstructive sleep apnea 02/28/2015   Acute on chronic diastolic congestive heart failure ([name])    Acute on chronic respiratory failure with hypoxia ([place])    Morbid obesity with BMI of 50.0-59.9, adult ([place]) 04/01/2014   HTN (hypertension) 04/01/2014    Orientation RESPIRATION BLADDER Height & Weight     Self, Time, Situation, Place   (Trach collar 28% during the day- Vent at night for 6-8 hours) Continent Weight: (!) 177.4 kg Height:  6' 3"$  (190.5 cm)  BEHAVIORAL SYMPTOMS/MOOD NEUROLOGICAL BOWEL NUTRITION STATUS      Continent Diet (Regular)  AMBULATORY STATUS COMMUNICATION OF NEEDS Skin   Extensive Assist Verbally Normal  Personal Care Assistance Level of Assistance  Bathing, Feeding, Dressing Bathing Assistance: Limited assistance Feeding assistance: Independent Dressing Assistance: Limited assistance Total  Care Assistance: Limited assistance   Functional Limitations Info    Sight Info: Adequate Hearing Info: Adequate Speech Info: Adequate    SPECIAL CARE FACTORS FREQUENCY  PT (By licensed PT), OT (By licensed OT)     PT Frequency: 5 times per week OT Frequency: 5 times per week            Contractures Contractures Info: Not present    Additional Factors Info  Code Status, Allergies Code Status Info: Full Allergies Info: NKA           Current Medications (05/28/2022):  This is the current hospital active medication list Current Facility-Administered Medications  Medication Dose Route Frequency Provider Last Rate Last Admin   apixaban (ELIQUIS) tablet 2.5 mg  2.5 mg Oral BID Noralee Space, RPH   2.5 mg at 05/28/22 [id number]   escitalopram (LEXAPRO) tablet 5 mg  5 mg Oral Daily Blake Divine, MD   5 mg at 05/28/22 [id number]   ferrous sulfate tablet 325 mg  325 mg Oral Q breakfast Blake Divine, MD   325 mg at 05/28/22 [id number]   guaiFENesin (MUCINEX) 12 hr tablet 600 mg  600 mg Oral BID PRN Blake Divine, MD   600 mg at 05/27/22 2109   hydrOXYzine (ATARAX) tablet 25 mg  25 mg Oral TID PRN Blake Divine, MD   25 mg at 05/13/22 2130   ipratropium-albuterol (DUONEB) 0.5-2.5 (3) MG/3ML nebulizer solution 3 mL  3 mL Inhalation Q6H PRN Blake Divine, MD       melatonin tablet 5 mg  5 mg Oral QHS Blake Divine, MD   5 mg at 05/27/22 2109   pantoprazole (PROTONIX) EC tablet 40 mg  40 mg Oral BID Doylene Bode, MD   40 mg at 05/28/22 [id number]   torsemide (DEMADEX) tablet 20 mg  20 mg Oral Daily Blake Divine, MD   20 mg at 05/28/22 [id number]   Current Outpatient Medications  Medication Sig Dispense Refill   apixaban (ELIQUIS) 2.5 MG TABS tablet Take 1 tablet (2.5 mg total) by mouth 2 (two) times daily. 60 tablet 0   bisacodyl (DULCOLAX) 10 MG suppository Place 1 suppository (10 mg total) rectally daily as needed for moderate constipation. 12 suppository 0   escitalopram (LEXAPRO) 5  MG tablet Take 1 tablet (5 mg total) by mouth daily. 30 tablet 0   ferrous sulfate 325 (65 FE) MG tablet Take 1 tablet (325 mg total) by mouth daily with breakfast. Home med 60 tablet 1   hydrOXYzine (ATARAX) 25 MG tablet Take 1 tablet (25 mg total) by mouth 3 (three) times daily as needed for anxiety. 30 tablet 0   ipratropium-albuterol (DUONEB) 0.5-2.5 (3) MG/3ML SOLN Take 3 mLs by nebulization every 6 (six) hours as needed.     melatonin 5 MG TABS Take 1 tablet (5 mg total) by mouth at bedtime. 30 tablet 0   pantoprazole (PROTONIX) 40 MG tablet Take 1 tablet (40 mg total) by mouth 2 (two) times daily before a meal. 60 tablet 0   polyethylene glycol (MIRALAX / GLYCOLAX) 17 g packet Take 17 g by mouth daily as needed for moderate constipation. 14 each 0   torsemide (DEMADEX) 20 MG tablet Take 1 tablet (20 mg total) by mouth daily. 30 tablet 2   COMBIVENT RESPIMAT 20-100 MCG/ACT AERS respimat Inhale 2 puffs into the lungs  in the morning and at bedtime. (Patient not taking: Reported on 01/24/2022)     simvastatin (ZOCOR) 10 MG tablet Take 1 tablet (10 mg total) by mouth daily at 6 PM. (Patient not taking: Reported on 01/24/2022) 30 tablet 1     Discharge Medications: Please see discharge summary for a list of discharge medications.  Relevant Imaging Results:  Relevant Lab Results:   Additional Information SSN [id number]  Shelbie Hutching, RN

## 2022-05-28 NOTE — ED Notes (Signed)
EDP made aware of pts swelling to left leg and foot

## 2022-05-28 NOTE — Progress Notes (Signed)
Occupational Therapy Treatment Patient Details Name: Gary [name] MRN: [id number] DOB: [birth year] Today's Date: 05/28/2022   History of present illness Gary [name] is a 39yoM who comes to [facility] on 04/26/21 reports his self care needs not being met after prior DC, staffing issues in home and reportedly not able to get his medications. Pt was DC from here 10 days prior after admission for respiratory distress, found to have active influenza infection, also CHF exacerbation. APS now involved in pt's case and he is in process of applying for medicare and obtaining facility placement. 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 At baseline, pt is lift transfers and total care +2, nonambulatory despite extensive time working with rehab in acute setting over several months with goals of maximizing independence in bed mobility and transfers.   OT comments  Gary [name] was seen for OT treatment on this date. Upon arrival to room pt reclined in bed, agreeable to tx. Pt requires MIN A exit bed, MAX A don B shoes sitting. MOD A x2 + RW sit<>stand, attempts second trial however pt states unable to continue 2/2 LLE swelling/pain. Returned to supine, completed bed level therex. Pt making good progress toward goals, will continue to follow POC. Discharge recommendation remains appropriate.     Recommendations for follow up therapy are one component of a multi-disciplinary discharge planning process, led by the attending physician.  Recommendations may be updated based on patient status, additional functional criteria and insurance authorization.    Follow Up Recommendations  OT at Long-term acute care hospital     Assistance Recommended at Discharge Frequent or constant Supervision/Assistance  Patient can return home with the following  Two people to help with walking and/or transfers;Two people to help with bathing/dressing/bathroom;Assistance  with cooking/housework;Help with stairs or ramp for entrance;Assist for transportation   Equipment Recommendations  Hospital bed    Recommendations for Other Services      Precautions / Restrictions Precautions Precautions: Fall Restrictions Weight Bearing Restrictions: No       Mobility Bed Mobility Overal bed mobility: Needs Assistance Bed Mobility: Supine to Sit, Sit to Supine     Supine to sit: Min assist Sit to supine: Max assist        Transfers Overall transfer level: Needs assistance Equipment used: Rolling walker (2 wheels) Transfers: Sit to/from Stand Sit to Stand: Mod assist, +2 physical assistance                 Balance Overall balance assessment: Needs assistance Sitting-balance support: Feet supported Sitting balance-[name] Scale: Good     Standing balance support: Bilateral upper extremity supported, Reliant on assistive device for balance Standing balance-[name] Scale: Fair                             ADL either performed or assessed with clinical judgement   ADL Overall ADL's : Needs assistance/impaired                                       General ADL Comments: MAX A don B shoes in sitting      Cognition Arousal/Alertness: Awake/alert Behavior During Therapy: WFL for tasks assessed/performed Overall Cognitive Status: Within Functional Limits for tasks assessed  Pertinent Vitals/ Pain       Pain Assessment Pain Assessment: Faces Faces Pain Scale: Hurts little more Pain Location: top of L foot Pain Descriptors / Indicators: Sore Pain Intervention(s): Limited activity within patient's tolerance, Repositioned, Patient requesting pain meds-RN notified   Frequency  Min 2X/week        Progress Toward Goals  OT Goals(current goals can now be found in the care plan section)     Acute Rehab OT Goals Patient Stated Goal: to walk OT Goal  Formulation: With patient Time For Goal Achievement: 06/04/22 Potential to Achieve Goals: Fair ADL Goals Pt Will Perform Grooming: sitting;with modified independence Pt Will Perform Lower Body Dressing: with min assist;sitting/lateral leans;with adaptive equipment Pt Will Transfer to Toilet: bedside commode;stand pivot transfer;with min assist;with +2 assist Pt/caregiver will Perform Home Exercise Program: Increased strength;Both right and left upper extremity;With theraband;With written HEP provided;Independently  Plan Discharge plan remains appropriate;Frequency remains appropriate    Co-evaluation    PT/OT/SLP Co-Evaluation/Treatment: Yes Reason for Co-Treatment: For patient/therapist safety;To address functional/ADL transfers PT goals addressed during session: Mobility/safety with mobility OT goals addressed during session: ADL's and self-care      AM-PAC OT "6 Clicks" Daily Activity     Outcome Measure   Help from another person eating meals?: None Help from another person taking care of personal grooming?: A Little Help from another person toileting, which includes using toliet, bedpan, or urinal?: A Lot Help from another person bathing (including washing, rinsing, drying)?: A Lot Help from another person to put on and taking off regular upper body clothing?: A Little Help from another person to put on and taking off regular lower body clothing?: A Lot 6 Click Score: 16    End of Session Equipment Utilized During Treatment: Rolling walker (2 wheels)  OT Visit Diagnosis: Other abnormalities of gait and mobility (R26.89);Muscle weakness (generalized) (M62.81)   Activity Tolerance Patient tolerated treatment well   Patient Left in bed;with call bell/phone within reach   Nurse Communication          Time: [id number] OT Time Calculation (min): 38 min  Charges: OT General Charges $OT Visit: 1 Visit OT Treatments $Self Care/Home Management : 8-22 mins $Therapeutic  Activity: 8-22 mins $Therapeutic Exercise: 8-22 mins  [name], M.S. OTR/L  05/28/22, 3:18 PM  ascom [phone]

## 2022-05-28 NOTE — ED Notes (Signed)
Pt requesting tylenol. Pt informed he has no orders for and I can ask MD about. Pt upset he has not seen MD yet.

## 2022-05-29 NOTE — Progress Notes (Signed)
Occupational Therapy Treatment Patient Details Name: Gary [name] MRN: [id number] DOB: 1955-05-09 Today's Date: 05/29/2022   History of present illness Gary [name] is a 43yoM who comes to [facility] on 04/26/21 reports his self care needs not being met after prior DC, staffing issues in home and reportedly not able to get his medications. Pt was DC from here 10 days prior after admission for respiratory distress, found to have active influenza infection, also CHF exacerbation. APS now involved in pt's case and he is in process of applying for medicare and obtaining facility placement. 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 At baseline, pt is lift transfers and total care +2, nonambulatory despite extensive time working with rehab in acute setting over several months with goals of maximizing independence in bed mobility and transfers.   OT comments  Gary [name] was seen for OT treatment on this date. Upon arrival to room pt reclined in bed, agreeable to tx. Pt requires CGA + RW sit<>stand x3 from max elevated bed height. Tolerated limited side steps along EOB, reports L knee pain. Pt making good progress toward goals, will continue to follow POC. Discharge recommendation remains appropriate.     Recommendations for follow up therapy are one component of a multi-disciplinary discharge planning process, led by the attending physician.  Recommendations may be updated based on patient status, additional functional criteria and insurance authorization.    Follow Up Recommendations  OT at Long-term acute care hospital     Assistance Recommended at Discharge Frequent or constant Supervision/Assistance  Patient can return home with the following  Two people to help with walking and/or transfers;Two people to help with bathing/dressing/bathroom;Assistance with cooking/housework;Help with stairs or ramp for entrance;Assist for  transportation   Equipment Recommendations  Hospital bed    Recommendations for Other Services      Precautions / Restrictions Precautions Precautions: Fall Restrictions Weight Bearing Restrictions: No       Mobility Bed Mobility Overal bed mobility: Needs Assistance Bed Mobility: Supine to Sit, Sit to Supine     Supine to sit: Min assist Sit to supine: Max assist        Transfers Overall transfer level: Needs assistance Equipment used: Rolling walker (2 wheels) Transfers: Sit to/from Stand Sit to Stand: Min guard, From elevated surface           General transfer comment: no physical assist to rise from max elevated bed x3     Balance Overall balance assessment: Needs assistance Sitting-balance support: Feet supported Sitting balance-[name] Scale: Good     Standing balance support: Bilateral upper extremity supported, Reliant on assistive device for balance Standing balance-[name] Scale: Fair                             ADL either performed or assessed with clinical judgement   ADL Overall ADL's : Needs assistance/impaired                                       General ADL Comments: MAX A don B shoes in sitting. MIN A + RW simulated BSC t/f      Cognition Arousal/Alertness: Awake/alert Behavior During Therapy: WFL for tasks assessed/performed Overall Cognitive Status: Within Functional Limits for tasks assessed  Pertinent Vitals/ Pain       Pain Assessment Pain Assessment: Faces Faces Pain Scale: Hurts a little bit Pain Location: top of L foot Pain Descriptors / Indicators: Sore Pain Intervention(s): Limited activity within patient's tolerance   Frequency  Min 2X/week        Progress Toward Goals  OT Goals(current goals can now be found in the care plan section)  Progress towards OT goals: Progressing toward goals  Acute Rehab OT  Goals Patient Stated Goal: to walk OT Goal Formulation: With patient Time For Goal Achievement: 06/04/22 Potential to Achieve Goals: Fair ADL Goals Pt Will Perform Grooming: sitting;with modified independence Pt Will Perform Lower Body Dressing: with min assist;sitting/lateral leans;with adaptive equipment Pt Will Transfer to Toilet: bedside commode;stand pivot transfer;with min assist;with +2 assist Pt/caregiver will Perform Home Exercise Program: Increased strength;Both right and left upper extremity;With theraband;With written HEP provided;Independently  Plan Discharge plan remains appropriate;Frequency remains appropriate    Co-evaluation    PT/OT/SLP Co-Evaluation/Treatment: Yes Reason for Co-Treatment: For patient/therapist safety;To address functional/ADL transfers PT goals addressed during session: Mobility/safety with mobility OT goals addressed during session: ADL's and self-care      AM-PAC OT "6 Clicks" Daily Activity     Outcome Measure   Help from another person eating meals?: None Help from another person taking care of personal grooming?: A Little Help from another person toileting, which includes using toliet, bedpan, or urinal?: A Lot Help from another person bathing (including washing, rinsing, drying)?: A Lot Help from another person to put on and taking off regular upper body clothing?: A Little Help from another person to put on and taking off regular lower body clothing?: A Lot 6 Click Score: 16    End of Session Equipment Utilized During Treatment: Rolling walker (2 wheels)  OT Visit Diagnosis: Other abnormalities of gait and mobility (R26.89);Muscle weakness (generalized) (M62.81)   Activity Tolerance Patient tolerated treatment well   Patient Left in bed;with call bell/phone within reach   Nurse Communication          Time: [id number] OT Time Calculation (min): 34 min  Charges: OT General Charges $OT Visit: 1 Visit OT Treatments $Self  Care/Home Management : 8-22 mins $Therapeutic Activity: 8-22 mins  [name], M.S. OTR/L  05/29/22, 3:20 PM  ascom [phone]

## 2022-05-29 NOTE — ED Notes (Signed)
Patient with large bowel movement in bedpan. Positioned for comfort. No distress noted at this time.

## 2022-05-29 NOTE — TOC Progression Note (Signed)
Transition of Care [facility]) - Progression Note    Patient Details  Name: Gary [name] MRN: [id number] Date of Birth: 1955-09-20  Transition of [facility]) CM/SW Contact  Shelbie Hutching, RN Phone Number: 05/29/2022, 9:41 AM  Clinical Narrative:    [facility] in [place] reviewing referral, they asked for his vent flowsheet information.  Sent them his report from [employer] with the patient's settings.  IF patient gets accepted to facility in [place] he will not be able to take the home vent he is currently using.  [employer] will be picking that up as they can't bill when the patient is in the hospital.     Expected Discharge Plan: Skilled Nursing Facility Barriers to Discharge: ED Unsafe disposition  Expected Discharge Plan and Services   Discharge Planning Services: CM Consult Post Acute Care Choice: [place] Living arrangements for the past 2 months: Single Family Home                 DME Arranged: N/A DME Agency: NA                   Social Determinants of Health (SDOH) Interventions SDOH Screenings   Food Insecurity: No Food Insecurity (04/15/2022)  Housing: Low Risk  (04/15/2022)  Transportation Needs: Unmet Transportation Needs (04/15/2022)  Utilities: Not At Risk (04/15/2022)  Recent Concern: Utilities - At Risk (02/05/2022)  Depression (PHQ2-9): Low Risk  (06/07/2021)  Financial Resource Strain: Medium Risk (05/03/2017)  Physical Activity: Insufficiently Active (05/03/2017)  Social Connections: Moderately Integrated (05/03/2017)  Stress: No Stress Concern Present (05/03/2017)  Tobacco Use: Medium Risk (04/26/2022)    Readmission Risk Interventions    12/31/2021    8:58 AM 12/20/2021    4:21 PM 05/04/2021    1:26 PM  Readmission Risk Prevention Plan  Transportation Screening Complete Complete Complete  Medication Review Press photographer) Complete Complete Complete  PCP or Specialist appointment within 3-5 days of discharge Complete Complete Complete   HRI or Home Care Consult Complete Complete Complete  SW Recovery Care/Counseling Consult Complete Complete Complete  Palliative Care Screening Complete Complete Not [place] Not Applicable Not Applicable Not Applicable

## 2022-05-29 NOTE — Progress Notes (Addendum)
Physical Therapy Treatment Patient Details Name: KONSTANDINOS SCULLEY MRN: [id number] DOB: June 22, 1955 Today's Date: 05/29/2022   History of Present Illness Truston Gentry is a 44yoM who comes to [facility] on 04/26/21 reports his self care needs not being met after prior DC, staffing issues in home and reportedly not able to get his medications. Pt was DC from here 10 days prior after admission for respiratory distress, found to have active influenza infection, also CHF exacerbation. APS now involved in pt's case and he is in process of applying for medicare and obtaining facility placement. 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 At baseline, pt is lift transfers and total care +2, nonambulatory despite extensive time working with rehab in acute setting over several months with goals of maximizing independence in bed mobility and transfers.    PT Comments    Patient is agreeable to PT session. He reports mild L foot pain with improved edema distally, but now reports mild edema around his knees. He was able stand from elevated bed height for 2.5 minutes using rolling walker for support. He also performed a stand step transfer from bed to chair with encouragement provided to remain sitting up for upright conditioning, perform HEP, etc to facilitate readiness for walking. He declined walking this morning due to knee edema, but is requesting to try walking in the afternoon after Torsemide medication has time to work today . PT will continue to follow to maximize independence.    Recommendations for follow up therapy are one component of a multi-disciplinary discharge planning process, led by the attending physician.  Recommendations may be updated based on patient status, additional functional criteria and insurance authorization.  Follow Up Recommendations   (LTC with therapy follow up) Can patient physically be transported by private vehicle:  No   Assistance Recommended at Discharge Intermittent Supervision/Assistance  Patient can return home with the following Two people to help with bathing/dressing/bathroom;Assistance with cooking/housework;Assist for transportation;Help with stairs or ramp for entrance;Two people to help with walking and/or transfers;Direct supervision/assist for medications management;Direct supervision/assist for financial management   Equipment Recommendations  None recommended by PT    Recommendations for Other Services       Precautions / Restrictions Precautions Precautions: Fall Restrictions Weight Bearing Restrictions: No     Mobility  Bed Mobility Overal bed mobility: Needs Assistance Bed Mobility: Supine to Sit, Sit to Supine     Supine to sit: Mod assist     General bed mobility comments: assistance for LLE support and trunk support provided. cues for sequencing. rest breaks required    Transfers Overall transfer level: Needs assistance Equipment used: Rolling walker (2 wheels) (bariatric rolling walker) Transfers: Sit to/from Stand Sit to Stand: Min assist, +2 physical assistance, From elevated surface   Step pivot transfers: Min assist       General transfer comment: patient performed standing x 2 bouts from bed with elevated bed height. cues for anterior weight shifting to promote lift off from bed with carry over demonstrated. patient was able to take several steps from bed to recliner chair (on the left) with occasional assistance for rolling walker  negotiation and cues for step initiation/direction.    Ambulation/Gait               General Gait Details: patient declined attempting to walk further than the stand/step transfer to chair due to self reported bilateral knee edema. he thinks the torsemide medication  will help and he reports wanting to try walking in the afternoon today. encourgaed patient to sit up in chair, perform HEP for increased independence with  functional mobility and to help with edema management.   Stairs             Wheelchair Mobility    Modified Rankin (Stroke Patients Only)       Balance Overall balance assessment: Needs assistance Sitting-balance support: Feet supported Sitting balance-[name] Scale: Good     Standing balance support: Bilateral upper extremity supported Standing balance-[name] Scale: Fair Standing balance comment: standing tolerance of 2.5 minutes today                            Cognition Arousal/Alertness: Awake/alert Behavior During Therapy: WFL for tasks assessed/performed Overall Cognitive Status: Within Functional Limits for tasks assessed                                          Exercises      General Comments        Pertinent Vitals/Pain Pain Assessment Pain Assessment: Faces Faces Pain Scale: Hurts a little bit Pain Location: top of L foot Pain Descriptors / Indicators: Sore Pain Intervention(s): Limited activity within patient's tolerance, Monitored during session    Home Living                          Prior Function            PT Goals (current goals can now be found in the care plan section) Acute Rehab PT Goals Patient Stated Goal: to walk and go home to his own apartment PT Goal Formulation: With patient Time For Goal Achievement: 06/07/22 Potential to Achieve Goals: Fair Progress towards PT goals: Progressing toward goals    Frequency    Min 2X/week      PT Plan Current plan remains appropriate    Co-evaluation              AM-PAC PT "6 Clicks" Mobility   Outcome Measure  Help needed turning from your back to your side while in a flat bed without using bedrails?: A Lot Help needed moving from lying on your back to sitting on the side of a flat bed without using bedrails?: A Lot Help needed moving to and from a bed to a chair (including a wheelchair)?: Total Help needed standing up from a chair  using your arms (e.g., wheelchair or bedside chair)?: Total Help needed to walk in hospital room?: Total Help needed climbing 3-5 steps with a railing? : Total 6 Click Score: 8    End of Session Equipment Utilized During Treatment: Oxygen (trach mask, PMSV) Activity Tolerance: Patient tolerated treatment well Patient left: in chair;with call bell/phone within reach Nurse Communication: Mobility status PT Visit Diagnosis: Unsteadiness on feet (R26.81);Muscle weakness (generalized) (M62.81);Difficulty in walking, not elsewhere classified (R26.2)     Time: [id number] PT Time Calculation (min) (ACUTE ONLY): 35 min  Charges:  $Therapeutic Activity: 23-37 mins                     [name], PT, MPT    [name] 05/29/2022, 10:53 AM

## 2022-05-29 NOTE — ED Provider Notes (Signed)
-----------------------------------------   6:14 AM on 05/29/2022 -----------------------------------------   Blood pressure 113/78, pulse 87, temperature 98.1 F (36.7 C), temperature source Oral, resp. rate 16, height '6\' 3"'$  (1.905 m), weight (!) 177.4 kg, SpO2 94 %.  The patient is calm and cooperative at this time.  There have been no acute events since the last update.  Awaiting disposition plan from case management/social work.    Sedrick Tober, Delice Bison, DO 05/29/22 [phone]

## 2022-05-30 NOTE — ED Provider Notes (Signed)
Emergency Medicine Observation Re-evaluation Note  Gary [name] is a 67 y.o. male, seen on rounds today.  Pt initially presented to the ED for complaints of Abdominal Pain  Currently, the patient is no acute distress. Pt asleep  Physical Exam  Blood pressure 115/82, pulse 86, temperature 98 F (36.7 C), temperature source Oral, resp. rate 19, height 6' 3"$  (1.905 m), weight (!) 177.4 kg, SpO2 95 %.  The patient is calm and cooperative at this time. There have been no acute events since the last update. Awaiting disposition plan from case management/social work.    Vanessa Whitehaven, MD 05/30/22 [phone]

## 2022-05-30 NOTE — Progress Notes (Signed)
Occupational Therapy Treatment Patient Details Name: Gary [name] MRN: [id number] DOB: 20-Dec-1955 Today's Date: 05/30/2022   History of present illness Gary [name] is a 4yoM who comes to [facility] on 04/26/21 reports his self care needs not being met after prior DC, staffing issues in home and reportedly not able to get his medications. Pt was DC from here 10 days prior after admission for respiratory distress, found to have active influenza infection, also CHF exacerbation. APS now involved in pt's case and he is in process of applying for medicare and obtaining facility placement. 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 At baseline, pt is lift transfers and total care +2, nonambulatory despite extensive time working with rehab in acute setting over several months with goals of maximizing independence in bed mobility and transfers.   OT comments  Gary [name] was seen for OT/PT co-treatment on this date. Upon arrival to room pt reclined in bed, agreeable to tx. Pt requires MIN A exit bed. MAX A don B shoes in sitting. CGA + RW sit<>stand and ~12 ft mobility, stood from max elevated bed height. Pt making good progress toward goals, will continue to follow POC. Discharge recommendation remains appropriate.     Recommendations for follow up therapy are one component of a multi-disciplinary discharge planning process, led by the attending physician.  Recommendations may be updated based on patient status, additional functional criteria and insurance authorization.    Follow Up Recommendations  OT at Long-term acute care hospital     Assistance Recommended at Discharge Frequent or constant Supervision/Assistance  Patient can return home with the following  Two people to help with walking and/or transfers;Two people to help with bathing/dressing/bathroom;Assistance with cooking/housework;Help with stairs or ramp for entrance;Assist  for transportation   Equipment Recommendations  Hospital bed    Recommendations for Other Services      Precautions / Restrictions Precautions Precautions: Fall Restrictions Weight Bearing Restrictions: No       Mobility Bed Mobility Overal bed mobility: Needs Assistance Bed Mobility: Supine to Sit     Supine to sit: Min assist          Transfers Overall transfer level: Needs assistance Equipment used: Rolling walker (2 wheels) Transfers: Sit to/from Stand Sit to Stand: Min guard, From elevated surface           General transfer comment: no physical assist to rise from max elevated bed x3     Balance Overall balance assessment: Needs assistance Sitting-balance support: Feet supported Sitting balance-[name] Scale: Good     Standing balance support: Bilateral upper extremity supported, Reliant on assistive device for balance Standing balance-[name] Scale: Fair                             ADL either performed or assessed with clinical judgement   ADL Overall ADL's : Needs assistance/impaired                                       General ADL Comments: MAX A don B shoes in sitting. CGA + RW simulated toilet t/f      Cognition Arousal/Alertness: Awake/alert Behavior During Therapy: WFL for tasks assessed/performed Overall Cognitive Status: Within Functional Limits for tasks assessed  Pertinent Vitals/ Pain       Pain Assessment Pain Assessment: No/denies pain   Frequency  Min 2X/week        Progress Toward Goals  OT Goals(current goals can now be found in the care plan section)     Acute Rehab OT Goals Patient Stated Goal: to walk OT Goal Formulation: With patient Time For Goal Achievement: 06/04/22 Potential to Achieve Goals: Fair ADL Goals Pt Will Perform Grooming: sitting;with modified independence Pt Will Perform Lower Body Dressing: with min  assist;sitting/lateral leans;with adaptive equipment Pt Will Transfer to Toilet: bedside commode;stand pivot transfer;with min assist;with +2 assist Pt/caregiver will Perform Home Exercise Program: Increased strength;Both right and left upper extremity;With theraband;With written HEP provided;Independently  Plan Discharge plan remains appropriate;Frequency remains appropriate    Co-evaluation    PT/OT/SLP Co-Evaluation/Treatment: Yes Reason for Co-Treatment: For patient/therapist safety;To address functional/ADL transfers PT goals addressed during session: Mobility/safety with mobility OT goals addressed during session: ADL's and self-care      AM-PAC OT "6 Clicks" Daily Activity     Outcome Measure   Help from another person eating meals?: None Help from another person taking care of personal grooming?: A Little Help from another person toileting, which includes using toliet, bedpan, or urinal?: A Lot Help from another person bathing (including washing, rinsing, drying)?: A Lot Help from another person to put on and taking off regular upper body clothing?: A Little Help from another person to put on and taking off regular lower body clothing?: A Lot 6 Click Score: 16    End of Session Equipment Utilized During Treatment: Rolling walker (2 wheels)  OT Visit Diagnosis: Other abnormalities of gait and mobility (R26.89);Muscle weakness (generalized) (M62.81)   Activity Tolerance Patient tolerated treatment well   Patient Left with call bell/phone within reach;in chair   Nurse Communication          Time: [id number] OT Time Calculation (min): 35 min  Charges: OT General Charges $OT Visit: 1 Visit OT Treatments $Self Care/Home Management : 8-22 mins  [name], M.S. OTR/L  05/30/22, 2:49 PM  ascom [phone]

## 2022-05-30 NOTE — Progress Notes (Signed)
Physical Therapy Treatment Patient Details Name: Gary [name] MRN: [id number] DOB: 1955-05-05 Today's Date: 05/30/2022   History of Present Illness Gary [name] is a 34yoM who comes to [facility] on 04/26/21 reports his self care needs not being met after prior DC, staffing issues in home and reportedly not able to get his medications. Pt was DC from here 10 days prior after admission for respiratory distress, found to have active influenza infection, also CHF exacerbation. APS now involved in pt's case and he is in process of applying for medicare and obtaining facility placement. 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 At baseline, pt is lift transfers and total care +2, nonambulatory despite extensive time working with rehab in acute setting over several months with goals of maximizing independence in bed mobility and transfers.    PT Comments    Patient is agreeable to PT and wants to try walking today. He was able to stand with only Min guard assistance from elevated surface. 2 bouts of standing performed with first standing bout of 1.5 minutes. Patient then stood and walked 57f with rolling walker with only Min guard assistance with chair follow for safety. Cues for standing rest breaks as needed. Mild dyspnea with exertion. Patient sitting up in chair at end of session.    Recommendations for follow up therapy are one component of a multi-disciplinary discharge planning process, led by the attending physician.  Recommendations may be updated based on patient status, additional functional criteria and insurance authorization.  Follow Up Recommendations   (LTC with therapy follow up) Can patient physically be transported by private vehicle: No   Assistance Recommended at Discharge Intermittent Supervision/Assistance  Patient can return home with the following Two people to help with bathing/dressing/bathroom;Assistance with  cooking/housework;Assist for transportation;Help with stairs or ramp for entrance;Two people to help with walking and/or transfers;Direct supervision/assist for medications management;Direct supervision/assist for financial management   Equipment Recommendations  None recommended by PT    Recommendations for Other Services       Precautions / Restrictions Precautions Precautions: Fall Restrictions Weight Bearing Restrictions: No     Mobility  Bed Mobility Overal bed mobility: Needs Assistance Bed Mobility: Supine to Sit     Supine to sit: Min assist     General bed mobility comments: assistance for LLE and for trunk support.    Transfers Overall transfer level: Needs assistance Equipment used: Rolling walker (2 wheels) Transfers: Sit to/from Stand Sit to Stand: Min guard, From elevated surface           General transfer comment: no lifting assistance required for standing. reinforcement of anterior weight shifting to facilitate standing    Ambulation/Gait Ambulation/Gait assistance: Min guard, +2 safety/equipment Gait Distance (Feet): 12 Feet Assistive device: Rolling walker (2 wheels) Gait Pattern/deviations: Step-through pattern Gait velocity: decreased     General Gait Details: slow but steady with ambulation to the hallway with chair follow. cues for taking rest breaks as needed and occasional cues for rolling walker positioning.   Stairs             Wheelchair Mobility    Modified Rankin (Stroke Patients Only)       Balance Overall balance assessment: Needs assistance Sitting-balance support: Feet supported Sitting balance-[name] Scale: Good     Standing balance support: Bilateral upper extremity supported Standing balance-[name] Scale: Fair Standing balance comment: standing tolerance of 1.5 minutes, and then standing during walking  Cognition Arousal/Alertness: Awake/alert Behavior During Therapy:  WFL for tasks assessed/performed Overall Cognitive Status: Within Functional Limits for tasks assessed                                          Exercises      General Comments        Pertinent Vitals/Pain Pain Assessment Pain Assessment: No/denies pain    Home Living                          Prior Function            PT Goals (current goals can now be found in the care plan section) Acute Rehab PT Goals Patient Stated Goal: to walk and go home to his own apartment PT Goal Formulation: With patient Time For Goal Achievement: 06/07/22 Potential to Achieve Goals: Fair Progress towards PT goals: Progressing toward goals    Frequency    Min 2X/week      PT Plan Current plan remains appropriate    Co-evaluation   Reason for Co-Treatment: For patient/therapist safety;To address functional/ADL transfers PT goals addressed during session: Mobility/safety with mobility OT goals addressed during session: ADL's and self-care      AM-PAC PT "6 Clicks" Mobility   Outcome Measure  Help needed turning from your back to your side while in a flat bed without using bedrails?: A Lot Help needed moving from lying on your back to sitting on the side of a flat bed without using bedrails?: A Lot Help needed moving to and from a bed to a chair (including a wheelchair)?: Total Help needed standing up from a chair using your arms (e.g., wheelchair or bedside chair)?: Total Help needed to walk in hospital room?: Total Help needed climbing 3-5 steps with a railing? : Total 6 Click Score: 8    End of Session Equipment Utilized During Treatment: Oxygen Activity Tolerance: Patient tolerated treatment well Patient left: in chair;with call bell/phone within reach   PT Visit Diagnosis: Unsteadiness on feet (R26.81);Muscle weakness (generalized) (M62.81);Difficulty in walking, not elsewhere classified (R26.2)     Time: [id number] PT Time Calculation (min)  (ACUTE ONLY): 34 min  Charges:  $Therapeutic Activity: 8-22 mins                     [name], PT, MPT    [name] 05/30/2022, 3:01 PM

## 2022-05-30 NOTE — Progress Notes (Signed)
PT Cancellation Note  Patient Details Name: Gary [name] MRN: [id number] DOB: 1955/07/27   Cancelled Treatment:    Reason Eval/Treat Not Completed: Other (comment) Patient just waking up, getting breakfast. He asked that PT return in the afternoon for walking attempts. Will plan to return in the afternoon.   [name], PT, MPT  [name] 05/30/2022, 8:50 AM

## 2022-05-30 NOTE — ED Notes (Signed)
RT at the bedside; Pt attached to the monitor for pulse ox monitoring while asleep. No other concerns voiced at this time.

## 2022-05-30 NOTE — ED Notes (Signed)
Pt external catheter replaced due to becoming loose and not sticking. Pt cleaned during this process.

## 2022-05-30 NOTE — ED Notes (Signed)
Pt given cranberry juice per request at this time.  Urine emptied from cannister.  Pt denies other needs at this time.

## 2022-05-30 NOTE — ED Notes (Signed)
Suction canister emptied, pt bed remade. Pt found sitting in chair NAD at this time.

## 2022-05-31 NOTE — ED Notes (Signed)
RN called pharmacy requesting they send pt ordered eliquis

## 2022-05-31 NOTE — Progress Notes (Signed)
Inpatient Rehab Admissions Coordinator:   Screened due to updated therapy recommendations to AIR.  Numerous barriers to [facility] CIR including available support at discharge, and lacks medical necessity for inpatient admit.  Note multiple failed attempts at discharge home and pt looking for long term placement.  Not a candidate for [facility] CIR.    Shann Medal, PT, DPT Admissions Coordinator [phone] 05/31/22  4:49 PM

## 2022-05-31 NOTE — Progress Notes (Addendum)
Occupational Therapy Treatment Patient Details Name: Gary [name] MRN: [id number] DOB: [birth year] Today's Date: 05/31/2022   History of present illness Gary [name] is a 81yoM who comes to [facility] on 04/26/21 reports his self care needs not being met after prior DC, staffing issues in home and reportedly not able to get his medications. Pt was DC from here 10 days prior after admission for respiratory distress, found to have active influenza infection, also CHF exacerbation. APS now involved in pt's case and he is in process of applying for medicare and obtaining facility placement. 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 At baseline, pt is lift transfers and total care +2, nonambulatory despite extensive time working with rehab in acute setting over several months with goals of maximizing independence in bed mobility and transfers.   OT comments  Gary [name] was seen for OT/PT co-treatment on this date. Upon arrival to room pt reclined in bed, agreeable to tx. Pt requires MIN A exit bed. Good sitting balance, pt self-initiates seated therex. CGA + RW sit>stand from max elevated bed height and tolerated ~18 ft mobility, +2 assist for chair follow and lines mgmt. MOD A stand>sit to chair, of note pt is 6'3" and chair is 20" seat height. Pt making good progress toward goals, will continue to follow POC. Discharge recommendation updated to AIR to reflect pt progress.    Recommendations for follow up therapy are one component of a multi-disciplinary discharge planning process, led by the attending physician.  Recommendations may be updated based on patient status, additional functional criteria and insurance authorization.    Follow Up Recommendations  Acute inpatient rehab (3hours/day)     Assistance Recommended at Discharge Frequent or constant Supervision/Assistance  Patient can return home with the following  Two people to help  with walking and/or transfers;Two people to help with bathing/dressing/bathroom;Assistance with cooking/housework;Help with stairs or ramp for entrance;Assist for transportation   Equipment Recommendations  Hospital bed    Recommendations for Other Services      Precautions / Restrictions Precautions Precautions: Fall Restrictions Weight Bearing Restrictions: No       Mobility Bed Mobility Overal bed mobility: Needs Assistance Bed Mobility: Supine to Sit     Supine to sit: Min assist          Transfers Overall transfer level: Needs assistance Equipment used: Rolling walker (2 wheels) Transfers: Sit to/from Stand Sit to Stand: Min guard, From elevated surface           General transfer comment: +2 for lines/equip mgmt     Balance Overall balance assessment: Needs assistance Sitting-balance support: Feet supported Sitting balance-[name] Scale: Good     Standing balance support: Bilateral upper extremity supported, Reliant on assistive device for balance Standing balance-[name] Scale: Fair                             ADL either performed or assessed with clinical judgement   ADL Overall ADL's : Needs assistance/impaired                                       General ADL Comments: MAX A don B shoes in sitting. CGA + RW simulated toilet t/f      Cognition Arousal/Alertness: Awake/alert Behavior During Therapy: WFL for tasks assessed/performed Overall  Cognitive Status: Within Functional Limits for tasks assessed                                           Pertinent Vitals/ Pain       Pain Assessment Pain Assessment: No/denies pain   Frequency  Min 2X/week        Progress Toward Goals  OT Goals(current goals can now be found in the care plan section)  Progress towards OT goals: Progressing toward goals  Acute Rehab OT Goals Patient Stated Goal: to walk OT Goal Formulation: With patient Time For Goal  Achievement: 06/04/22 Potential to Achieve Goals: Fair ADL Goals Pt Will Perform Grooming: sitting;with modified independence Pt Will Perform Lower Body Dressing: with min assist;sitting/lateral leans;with adaptive equipment Pt Will Transfer to Toilet: bedside commode;stand pivot transfer;with min assist;with +2 assist Pt/caregiver will Perform Home Exercise Program: Increased strength;Both right and left upper extremity;With theraband;With written HEP provided;Independently  Plan Discharge plan remains appropriate;Frequency remains appropriate    Co-evaluation    PT/OT/SLP Co-Evaluation/Treatment: Yes Reason for Co-Treatment: For patient/therapist safety;To address functional/ADL transfers PT goals addressed during session: Mobility/safety with mobility OT goals addressed during session: ADL's and self-care      AM-PAC OT "6 Clicks" Daily Activity     Outcome Measure   Help from another person eating meals?: None Help from another person taking care of personal grooming?: A Little Help from another person toileting, which includes using toliet, bedpan, or urinal?: A Lot Help from another person bathing (including washing, rinsing, drying)?: A Lot Help from another person to put on and taking off regular upper body clothing?: A Little Help from another person to put on and taking off regular lower body clothing?: A Lot 6 Click Score: 16    End of Session Equipment Utilized During Treatment: Rolling walker (2 wheels)  OT Visit Diagnosis: Other abnormalities of gait and mobility (R26.89);Muscle weakness (generalized) (M62.81)   Activity Tolerance Patient tolerated treatment well   Patient Left with call bell/phone within reach;in chair   Nurse Communication          Time: [id number] OT Time Calculation (min): 27 min  Charges: OT General Charges $OT Visit: 1 Visit OT Treatments $Self Care/Home Management : 8-22 mins  [name], M.S. OTR/L  05/31/22, 2:25 PM  ascom  [phone]

## 2022-05-31 NOTE — Progress Notes (Addendum)
Physical Therapy Treatment Patient Details Name: Gary [name] MRN: [id number] DOB: Sep 27, 1955 Today's Date: 05/31/2022   History of Present Illness Gary [name] is a 28yoM who comes to [facility] on 04/26/21 reports his self care needs not being met after prior DC, staffing issues in home and reportedly not able to get his medications. Pt was DC from here 10 days prior after admission for respiratory distress, found to have active influenza infection, also CHF exacerbation. APS now involved in pt's case and he is in process of applying for medicare and obtaining facility placement. 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 At baseline, pt is lift transfers and total care +2, nonambulatory despite extensive time working with rehab in acute setting over several months with goals of maximizing independence in bed mobility and transfers.    PT Comments    Patient was agreeable to PT. He walked 48f with the bariatric rolling walker with Min guard assistance and chair following for safety. Patient still unable to stand from a standard height surface, but is able to stand with no physical assistance with bed height elevated, Min guard for safety and cues for anterior weight shifting to facilitate standing. He required moderate assistance for sitting in a low chair due to decreased eccentric control and tall height. He is making progress with functional independence, increased standing activity tolerance, and has now ambulated 4 times with the chair following. This week, patient has tolerated splitting PT and OT sessions twice, with occasional co-treating for progression of functional mobility. PT will continue to follow to maximize independence and facilitate return to prior level of function. Given progress, discharge recommendations have been updated to inpatient rehab.    Recommendations for follow up therapy are one component of a  multi-disciplinary discharge planning process, led by the attending physician.  Recommendations may be updated based on patient status, additional functional criteria and insurance authorization.  Follow Up Recommendations  Long-term institutional care without follow-up therapy Can patient physically be transported by private vehicle: No   Assistance Recommended at Discharge Intermittent Supervision/Assistance  Patient can return home with the following Two people to help with bathing/dressing/bathroom;Assistance with cooking/housework;Assist for transportation;Help with stairs or ramp for entrance;Two people to help with walking and/or transfers;Direct supervision/assist for medications management;Direct supervision/assist for financial management   Equipment Recommendations  None recommended by PT    Recommendations for Other Services       Precautions / Restrictions Precautions Precautions: Fall Restrictions Weight Bearing Restrictions: No     Mobility  Bed Mobility Overal bed mobility: Needs Assistance Bed Mobility: Supine to Sit     Supine to sit: Min assist     General bed mobility comments: assistance for LLE support    Transfers Overall transfer level: Needs assistance Equipment used: Rolling walker (2 wheels) Transfers: Sit to/from Stand Sit to Stand: Min guard, From elevated surface           General transfer comment: patient able to stand with the bed in a high position with no physical assistance. reinforcement of anterior weight shifting    Ambulation/Gait Ambulation/Gait assistance: Min guard, +2 safety/equipment Gait Distance (Feet): 18 Feet Assistive device: Rolling walker (2 wheels) Gait Pattern/deviations: Step-through pattern Gait velocity: decreased     General Gait Details: slow but steady with ambulation to the hallway with chair follow. cues for taking rest breaks as needed and occasional cues for rolling walker positioning.   Stairs  Wheelchair Mobility    Modified Rankin (Stroke Patients Only)       Balance Overall balance assessment: Needs assistance Sitting-balance support: Feet supported Sitting balance-[name] Scale: Good     Standing balance support: Bilateral upper extremity supported, Reliant on assistive device for balance Standing balance-[name] Scale: Fair                              Cognition Arousal/Alertness: Awake/alert Behavior During Therapy: WFL for tasks assessed/performed Overall Cognitive Status: Within Functional Limits for tasks assessed                                          Exercises      General Comments General comments (skin integrity, edema, etc.): patient walked with Fi02 28%, 3 L02      Pertinent Vitals/Pain Pain Assessment Pain Assessment: No/denies pain    Home Living                          Prior Function            PT Goals (current goals can now be found in the care plan section) Acute Rehab PT Goals Patient Stated Goal: to walk and go home to his own apartment PT Goal Formulation: With patient Time For Goal Achievement: 06/07/22 Potential to Achieve Goals: Fair Progress towards PT goals: Progressing toward goals    Frequency    Min 2X/week      PT Plan Current plan remains appropriate    Co-evaluation PT/OT/SLP Co-Evaluation/Treatment: Yes Reason for Co-Treatment: For patient/therapist safety;To address functional/ADL transfers PT goals addressed during session: Mobility/safety with mobility OT goals addressed during session: ADL's and self-care      AM-PAC PT "6 Clicks" Mobility   Outcome Measure  Help needed turning from your back to your side while in a flat bed without using bedrails?: A Lot Help needed moving from lying on your back to sitting on the side of a flat bed without using bedrails?: A Lot Help needed moving to and from a bed to a chair (including a wheelchair)?:  Total Help needed standing up from a chair using your arms (e.g., wheelchair or bedside chair)?: Total Help needed to walk in hospital room?: Total Help needed climbing 3-5 steps with a railing? : Total 6 Click Score: 8    End of Session Equipment Utilized During Treatment: Oxygen Activity Tolerance: Patient tolerated treatment well Patient left: in chair;with call bell/phone within reach Nurse Communication: Mobility status PT Visit Diagnosis: Unsteadiness on feet (R26.81);Muscle weakness (generalized) (M62.81);Difficulty in walking, not elsewhere classified (R26.2)     Time: [id number] PT Time Calculation (min) (ACUTE ONLY): 24 min  Charges:  $Therapeutic Activity: 8-22 mins                    [name], PT, MPT    [name] 05/31/2022, 1:34 PM

## 2022-05-31 NOTE — ED Provider Notes (Signed)
-----------------------------------------   6:15 AM on 05/31/2022 -----------------------------------------   Blood pressure 119/82, pulse 83, temperature 98 F (36.7 C), resp. rate 18, height '6\' 3"'$  (1.905 m), weight (!) 177.4 kg, SpO2 100 %.  The patient is calm and cooperative at this time.  There have been no acute events since the last update.  Awaiting disposition plan from case management/social work.    Szymon Foiles, Delice Bison, DO 05/31/22 [phone]

## 2022-05-31 NOTE — ED Notes (Addendum)
RT @ the bedside. Pt provided some juice; no other concerns at this time.

## 2022-05-31 NOTE — ED Notes (Signed)
RN called to pt room. Pt requesting yonker be placed back in his reach. Pt resting easy watching tv at this time. Breathing unlabored speaking in full sentences free from sign of distress. Pt denies pain or further needs at this time. Hospital bed is low and locked with side rails raised. Pt table and belongings in reach as well as call button.

## 2022-05-31 NOTE — ED Notes (Signed)
Assumed care from [place]. Pt resting comfortably in bed at this time. Pt denies any current needs or questions. Call light with in reach.

## 2022-05-31 NOTE — ED Notes (Signed)
Pt waiting on breakfast.

## 2022-05-31 NOTE — TOC Progression Note (Signed)
Transition of Care [facility]) - Progression Note    Patient Details  Name: Gary [name] MRN: [id number] Date of Birth: [birth year]  [facility] of [facility] CM/SW Contact  Shelbie Hutching, RN Phone Number: 05/31/2022, 1:34 PM  Clinical Narrative:    Reached back out to [facility] in [place] and have not heard back.  Never heard back from [place].  PT reported that patient was walking some and that he had not given up his apartment.  It may be a possibility to get patient back home, but he did fail twice before being at home.   PT now recommending inpatient rehab, will send referral to [place].     Expected Discharge Plan: [place] Barriers to Discharge: ED Unsafe disposition  Expected Discharge Plan and Services   Discharge Planning Services: CM Consult Post Acute Care Choice: [place] Living arrangements for the past 2 months: Single Family Home                 DME Arranged: N/A DME Agency: NA                   Social Determinants of Health (SDOH) Interventions SDOH Screenings   Food Insecurity: No Food Insecurity (04/15/2022)  Housing: Low Risk  (04/15/2022)  Transportation Needs: Unmet Transportation Needs (04/15/2022)  Utilities: Not At Risk (04/15/2022)  Recent Concern: Utilities - At Risk (02/05/2022)  Depression (PHQ2-9): Low Risk  (06/07/2021)  Financial Resource Strain: Medium Risk (05/03/2017)  Physical Activity: Insufficiently Active (05/03/2017)  Social Connections: Moderately Integrated (05/03/2017)  Stress: No Stress Concern Present (05/03/2017)  Tobacco Use: Medium Risk (04/26/2022)    Readmission Risk Interventions    12/31/2021    8:58 AM 12/20/2021    4:21 PM 05/04/2021    1:26 PM  Readmission Risk Prevention Plan  Transportation Screening Complete Complete Complete  Medication Review Press photographer) Complete Complete Complete  PCP or Specialist appointment within 3-5 days of discharge Complete Complete Complete  HRI or Home  Care Consult Complete Complete Complete  SW Recovery Care/Counseling Consult Complete Complete Complete  Palliative Care Screening Complete Complete Not [place] Not Applicable Not Applicable Not Applicable

## 2022-06-01 NOTE — ED Notes (Signed)
This RN to bedside to answer pt's call light. Pt. Is currently on bedpan, states he is ready to get off. Pt. Taken off bedpan, pericare provided, cleaned, chucks replaced. Pt. States he prefers sheets be changed when he is up in chair later. No skin breakdown on buttock or sacrum noted, mepilex applied to sacrum to prevent any breakdown. Barrier cream applied to peri area and scrotum where pt. States he is itching. Pt's back and legs cleansed with wipes, and dried with towels. Pt. Repositioned for comfort, NAD. Denies any further need.

## 2022-06-01 NOTE — ED Notes (Signed)
This RN to bedside with Gary [name], Advertising copywriter for pt. Assessment and medications. Pt. Is conversational and pleasant. Provided with hydration by this RN. Discussed pt's desire to have bath today.

## 2022-06-01 NOTE — ED Provider Notes (Deleted)
-----------------------------------------   7:03 AM on 06/01/2022 -----------------------------------------   Blood pressure 119/80, pulse 83, temperature 98 F (36.7 C), temperature source Oral, resp. rate 20, height 6' 3"$  (1.905 m), weight (!) 177.4 kg, SpO2 96 %.  The patient is calm and cooperative at this time.  There have been no acute events since the last update.  Awaiting disposition plan from case management/social work.    Nathaniel Man, MD 06/01/22 [phone]
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
## 2022-06-01 NOTE — ED Provider Notes (Signed)
-----------------------------------------   6:07 AM on 06/01/2022 -----------------------------------------   Blood pressure 119/80, pulse 83, temperature 98 F (36.7 C), temperature source Oral, resp. rate 20, height 6' 3"$  (1.905 m), weight (!) 177.4 kg, SpO2 96 %.  The patient is calm and cooperative at this time.  There have been no acute events since the last update.  Awaiting disposition plan from Social Work team.   Paulette Blanch, MD 06/01/22 [phone]
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
## 2022-06-01 NOTE — Progress Notes (Signed)
Patient refused to wear home vent for HS. Stable on ATC 28%.

## 2022-06-02 MED ORDER — HYDROCODONE-ACETAMINOPHEN 5-325 MG PO TABS
1.0000 | ORAL_TABLET | Freq: Once | ORAL | Status: AC
  Administered 2022-06-02: 1 via ORAL
  Filled 2022-06-02: qty 1

## 2022-06-02 NOTE — ED Notes (Signed)
Pt complaining of male purewick "leaking". New purewick placed, linens changed, new chucks placed.

## 2022-06-02 NOTE — Progress Notes (Signed)
Patient seen due to compliant of trach discomfort. Patient talking on phone when I arrived in no distress. Patient states has had increased throat pain throughout the day.  He has not required suctioning and has not coughed up much as far as secretions. I explained I am not able to look down airway into his throat. Such would require special scope from ENT physician most likely. No drainage noted from stoma other than small amount of secretions when gauze dressing was change. Patient has mediplex dressing under trach to help prevent pressure areas. Inner cannula noted to be clean and free of secretions. Advised patient to continue to keep trach ties secure as weight from trach collar or home vent may cause weight bearing pain also.

## 2022-06-02 NOTE — ED Provider Notes (Signed)
-----------------------------------------   5:18 AM on 06/02/2022 -----------------------------------------   Blood pressure 104/80, pulse 87, temperature 98.2 F (36.8 C), temperature source Oral, resp. rate 18, height 6' 3"$  (1.905 m), weight (!) 177.4 kg, SpO2 95 %.  The patient is calm and cooperative at this time.  There have been no acute events since the last update.  Awaiting disposition plan from case management/social work.    Ugochi Henzler, Delice Bison, DO 06/02/22 [phone]

## 2022-06-02 NOTE — ED Notes (Signed)
Pt purewick changed, perineal area cleaned, new bed sheet and chucks placed.

## 2022-06-02 NOTE — ED Notes (Signed)
This RN and Miquel Dunn, RN went to give pt a bed bath as pt requested a bath today. Pt stated towards Miquel Dunn, RN "Are you gay? I don't feel comfortable being touched by a gay person". This RN and Miquel Dunn, RN stated understanding and left.

## 2022-06-02 NOTE — ED Provider Notes (Signed)
-----------------------------------------   10:07 PM on 06/02/2022 ----------------------------------------- Patient is complaining of pain around his trachea.  I have inspected this area I do not see any obvious cause for the discomfort.  Patient CPAP and oxygen appear to be pulling on the area at times, could be sore from this.  We will dose one-time dose of Norco.  Continue to await social work placement.  Patient has no other concerns at this time.  Appears overall comfortable watching TV lying in bed.   Harvest Dark, MD 06/02/22 [phone]

## 2022-06-02 NOTE — Progress Notes (Signed)
Assisted patient transition from home vent to [facility]. Home vent on standby at bedside, patient on 5L TC tolerating well. Patient declined further trach care at this time.

## 2022-06-02 NOTE — ED Notes (Signed)
Pt demanded Mepilex be removed. Pt enducated on need to keep Mepilex on to avoid skin breakdown. Pt stated "it is too itchy. Get it off me now." Mepilex removed. Pt in NAD at this time.

## 2022-06-02 NOTE — ED Notes (Signed)
Pt had small BM on bedpan.
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
## 2022-06-03 NOTE — Progress Notes (Signed)
Patient off home vent and back on 28% trach collar. No distress noted.

## 2022-06-03 NOTE — ED Provider Notes (Signed)
-----------------------------------------   7:15 AM on 06/03/2022 -----------------------------------------   Blood pressure 115/77, pulse 81, temperature 98 F (36.7 C), temperature source Oral, resp. rate 18, height 6' 3"$  (1.905 m), weight (!) 177.4 kg, SpO2 94 %.  The patient is calm and cooperative at this time.  There have been no acute events since the last update.  Awaiting disposition plan from case management/social work.  No complaints this morning.  States his throat is no longer sore.  States he is feeling pretty good.  Eating breakfast of eggs and a bagel    Nathaniel Man, MD 06/03/22 [phone]

## 2022-06-03 NOTE — ED Notes (Signed)
Called to pts room. Pt awake, NAD, and requesting to have the pulse ox removed from his finger. This was done and monitor placed on standby. Pt requested that the thermostat be turned up to 74 degrees. This was done. Pt requested and was given a cup and 3 apple juices. Pt requested that the room door be left ajar approx 4 inches and this was done.

## 2022-06-04 NOTE — ED Notes (Signed)
Pt provided with sandwich tray per request

## 2022-06-04 NOTE — Progress Notes (Addendum)
Physical Therapy Treatment Patient Details Name: CLOISE CAMBARERI MRN: [id number] DOB: 11-20-1955 Today's Date: 06/04/2022   History of Present Illness Chaddrick Forbes is a 59yoM who comes to [facility] on 04/26/21 reports his self care needs not being met after prior DC, staffing issues in home and reportedly not able to get his medications. Pt was DC from here 10 days prior after admission for respiratory distress, found to have active influenza infection, also CHF exacerbation. APS now involved in pt's case and he is in process of applying for medicare and obtaining facility placement. 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 At baseline, pt is lift transfers and total care +2, nonambulatory despite extensive time working with rehab in acute setting over several months with goals of maximizing independence in bed mobility and transfers.    PT Comments    Patient is agreeable to PT. He was able to stand x 3 bouts with one person assistance initially progressing to requiring 2 person assistance by the third stand. Subjective weakness in LLE today with standing and patient unable to advance the left leg today for walking. Pre-gait activity performed with focusing on increasing standing tolerance, taking side steps. Standing bed height measured today at 31 inches with the goal of decreasing height requirement for standing over time. The patient reports he wants to try again tomorrow for ambulation if able. PT will continue to follow.    Recommendations for follow up therapy are one component of a multi-disciplinary discharge planning process, led by the attending physician.  Recommendations may be updated based on patient status, additional functional criteria and insurance authorization.  Follow Up Recommendations  Acute inpatient rehab (3hours/day) Can patient physically be transported by private vehicle: No   Assistance Recommended at  Discharge Intermittent Supervision/Assistance  Patient can return home with the following Two people to help with bathing/dressing/bathroom;Assistance with cooking/housework;Assist for transportation;Help with stairs or ramp for entrance;Two people to help with walking and/or transfers;Direct supervision/assist for medications management;Direct supervision/assist for financial management   Equipment Recommendations  None recommended by PT    Recommendations for Other Services       Precautions / Restrictions Precautions Precautions: Fall Restrictions Weight Bearing Restrictions: No     Mobility  Bed Mobility Overal bed mobility: Needs Assistance Bed Mobility: Supine to Sit, Sit to Supine     Supine to sit: Min assist Sit to supine: Mod assist   General bed mobility comments: assistance for LLE to sit upright and assistance for BLE support to return to bed    Transfers Overall transfer level: Needs assistance Equipment used: Rolling walker (2 wheels) Transfers: Sit to/from Stand Sit to Stand: From elevated surface, Min assist           General transfer comment: 3 bouts of standing with cues for anterior weight shifting to facilitate standing. Min A of one person for first 2 stands and Min A +2 person required for 3rd standing bout. standing height was measured at 31 inches today with the goal of decreasing standing height to a standard height as able.    Ambulation/Gait             Pre-gait activities: patient was unable to advance LLE to walk today with subjective weakness. encouraged increased standing tolerance, weight shifting, side steps. patient is able to advance the right leg forwards and backwars once.     Stairs  Wheelchair Mobility    Modified Rankin (Stroke Patients Only)       Balance Overall balance assessment: Needs assistance Sitting-balance support: Feet supported Sitting balance-[name] Scale: Good     Standing balance  support: Bilateral upper extremity supported, Reliant on assistive device for balance Standing balance-[name] Scale: Fair                              Cognition Arousal/Alertness: Awake/alert Behavior During Therapy: WFL for tasks assessed/performed Overall Cognitive Status: Within Functional Limits for tasks assessed                                          Exercises      General Comments        Pertinent Vitals/Pain Pain Assessment Pain Assessment: No/denies pain    Home Living                          Prior Function            PT Goals (current goals can now be found in the care plan section) Acute Rehab PT Goals Patient Stated Goal: to walk and go home to his own apartment PT Goal Formulation: With patient Time For Goal Achievement: 06/07/22 Potential to Achieve Goals: Fair Progress towards PT goals: Progressing toward goals    Frequency    Min 2X/week      PT Plan Current plan remains appropriate    Co-evaluation PT/OT/SLP Co-Evaluation/Treatment: Yes Reason for Co-Treatment: For patient/therapist safety;To address functional/ADL transfers PT goals addressed during session: Mobility/safety with mobility OT goals addressed during session: ADL's and self-care      AM-PAC PT "6 Clicks" Mobility   Outcome Measure  Help needed turning from your back to your side while in a flat bed without using bedrails?: A Lot Help needed moving from lying on your back to sitting on the side of a flat bed without using bedrails?: A Lot Help needed moving to and from a bed to a chair (including a wheelchair)?: Total Help needed standing up from a chair using your arms (e.g., wheelchair or bedside chair)?: Total Help needed to walk in hospital room?: Total Help needed climbing 3-5 steps with a railing? : Total 6 Click Score: 8    End of Session Equipment Utilized During Treatment: Oxygen Activity Tolerance: Patient tolerated  treatment well;Patient limited by fatigue Patient left: in bed;with call bell/phone within reach   PT Visit Diagnosis: Unsteadiness on feet (R26.81);Muscle weakness (generalized) (M62.81);Difficulty in walking, not elsewhere classified (R26.2)     Time: 1310-1350 PT Time Calculation (min) (ACUTE ONLY): 40 min  Charges:  $Therapeutic Activity: 23-37 mins                     [name], PT, MPT    [name] 06/04/2022, 3:18 PM

## 2022-06-04 NOTE — TOC Progression Note (Addendum)
Transition of Care [facility]) - Progression Note    Patient Details  Name: Gary [name] MRN: [id number] Date of Birth: 1956-02-27  [facility] of [facility]) CM/SW Contact  Cecil Cobbs Phone Number: 06/04/2022, 6:03 PM  Clinical Narrative:     PT recommending acute inpatient rehab.  This Probation officer sent referral to [facility] to review patient's information.  Also referral made to [facility].  Expected Discharge Plan: [place] Barriers to Discharge: ED Unsafe disposition  Expected Discharge Plan and Services   Discharge Planning Services: CM Consult Post Acute Care Choice: [place] Living arrangements for the past 2 months: Single Family Home                 DME Arranged: N/A DME Agency: NA                   Social Determinants of Health (SDOH) Interventions SDOH Screenings   Food Insecurity: No Food Insecurity (04/15/2022)  Housing: Low Risk  (04/15/2022)  Transportation Needs: Unmet Transportation Needs (04/15/2022)  Utilities: Not At Risk (04/15/2022)  Recent Concern: Utilities - At Risk (02/05/2022)  Depression (PHQ2-9): Low Risk  (06/07/2021)  Financial Resource Strain: Medium Risk (05/03/2017)  Physical Activity: Insufficiently Active (05/03/2017)  Social Connections: Moderately Integrated (05/03/2017)  Stress: No Stress Concern Present (05/03/2017)  Tobacco Use: Medium Risk (04/26/2022)    Readmission Risk Interventions    12/31/2021    8:58 AM 12/20/2021    4:21 PM 05/04/2021    1:26 PM  Readmission Risk Prevention Plan  Transportation Screening Complete Complete Complete  Medication Review Press photographer) Complete Complete Complete  PCP or Specialist appointment within 3-5 days of discharge Complete Complete Complete  HRI or Home Care Consult Complete Complete Complete  SW Recovery Care/Counseling Consult Complete Complete Complete  Palliative Care Screening Complete Complete Not [place] Not  Applicable Not Applicable Not Applicable

## 2022-06-04 NOTE — Progress Notes (Signed)
Occupational Therapy Re-Evaluation Patient Details Name: Gary [name] MRN: [id number] DOB: Jan 27, 1956 Today's Date: 06/04/2022   History of present illness Gary [name] is a 67yoM who comes to [facility] on 04/26/21 reports his self care needs not being met after prior DC, staffing issues in home and reportedly not able to get his medications. Pt was DC from here 10 days prior after admission for respiratory distress, found to have active influenza infection, also CHF exacerbation. APS now involved in pt's case and he is in process of applying for medicare and obtaining facility placement. 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 At baseline, pt is lift transfers and total care +2, nonambulatory despite extensive time working with rehab in acute setting over several months with goals of maximizing independence in bed mobility and transfers.   OT comments  [name] was seen for OT/PT co - treatment with re-evaluation on this date. Upon arrival to room pt reclined in bed, agreeable to tx. Pt requires MIN A exit bed. MAX A don B shoes seated. MIN A + RW sit<>stand at bed height x2 trials, +2 assist for 3rd trial. Bed elevated at 31" with plans to decrease next session. Goals met and upgraded this session. Discharge recommendation remains appropriate.     Recommendations for follow up therapy are one component of a multi-disciplinary discharge planning process, led by the attending physician.  Recommendations may be updated based on patient status, additional functional criteria and insurance authorization.    Follow Up Recommendations  Acute inpatient rehab (3hours/day)     Assistance Recommended at Discharge Frequent or constant Supervision/Assistance  Patient can return home with the following  Two people to help with walking and/or transfers;Two people to help with bathing/dressing/bathroom;Assistance with cooking/housework;Help  with stairs or ramp for entrance;Assist for transportation   Equipment Recommendations  Hospital bed    Recommendations for Other Services      Precautions / Restrictions Precautions Precautions: Fall Restrictions Weight Bearing Restrictions: No       Mobility Bed Mobility Overal bed mobility: Needs Assistance Bed Mobility: Supine to Sit, Sit to Supine     Supine to sit: Min assist Sit to supine: Mod assist   General bed mobility comments: assist for BLE    Transfers Overall transfer level: Needs assistance Equipment used: Rolling walker (2 wheels) Transfers: Sit to/from Stand Sit to Stand: From elevated surface, Min assist           General transfer comment: x3 trials, +2 physical assist 3rd trial     Balance Overall balance assessment: Needs assistance Sitting-balance support: Feet supported Sitting balance-[name] Scale: Good     Standing balance support: Bilateral upper extremity supported, Reliant on assistive device for balance Standing balance-[name] Scale: Fair                             ADL either performed or assessed with clinical judgement   ADL Overall ADL's : Needs assistance/impaired                                       General ADL Comments: MAX A don B shoes in sitting. MIN A x2 + RW simulated toilet t/f      Cognition Arousal/Alertness: Awake/alert Behavior During Therapy: WFL for tasks assessed/performed Overall Cognitive Status: Within  Functional Limits for tasks assessed                                           Pertinent Vitals/ Pain       Pain Assessment Pain Assessment: No/denies pain   Frequency  Min 2X/week        Progress Toward Goals  OT Goals(current goals can now be found in the care plan section)  Progress towards OT goals: Progressing toward goals  Acute Rehab OT Goals Patient Stated Goal: to go home OT Goal Formulation: With patient Time For Goal  Achievement: 06/18/22 Potential to Achieve Goals: Fair ADL Goals Pt Will Perform Grooming: with min assist;standing Pt Will Perform Lower Body Dressing: with min assist;sitting/lateral leans;with adaptive equipment Pt Will Transfer to Toilet: with modified independence;ambulating;regular height toilet Pt/caregiver will Perform Home Exercise Program: Increased strength;Both right and left upper extremity;With theraband;With written HEP provided;Independently  Plan Discharge plan remains appropriate;Frequency remains appropriate    Co-evaluation    PT/OT/SLP Co-Evaluation/Treatment: Yes Reason for Co-Treatment: For patient/therapist safety;To address functional/ADL transfers PT goals addressed during session: Mobility/safety with mobility OT goals addressed during session: ADL's and self-care      AM-PAC OT "6 Clicks" Daily Activity     Outcome Measure   Help from another person eating meals?: None Help from another person taking care of personal grooming?: A Little Help from another person toileting, which includes using toliet, bedpan, or urinal?: A Lot Help from another person bathing (including washing, rinsing, drying)?: A Lot Help from another person to put on and taking off regular upper body clothing?: A Little Help from another person to put on and taking off regular lower body clothing?: A Lot 6 Click Score: 16    End of Session Equipment Utilized During Treatment: Rolling walker (2 wheels)  OT Visit Diagnosis: Other abnormalities of gait and mobility (R26.89);Muscle weakness (generalized) (M62.81)   Activity Tolerance Patient tolerated treatment well   Patient Left with call bell/phone within reach;in bed   Nurse Communication          Time: 1310-1350 OT Time Calculation (min): 40 min  Charges: OT General Charges $OT Visit: 1 Visit OT Evaluation $OT Re-eval: 1 Re-eval OT Treatments $Self Care/Home Management : 8-22 mins  [name], M.S. OTR/L   06/04/22, 2:04 PM  ascom [phone]

## 2022-06-04 NOTE — ED Notes (Signed)
Patient placed on bedpan. Patient had large bowel  movement. Patient cleansed and repositioned in bed for comfort.

## 2022-06-05 NOTE — ED Notes (Signed)
PT PROVIDED WITH APPLE JUICE PER REQUEST

## 2022-06-05 NOTE — ED Notes (Signed)
This RN offered pt a bath and pt requested to wait until PT/OT this afternoon. Pt denies any needs at this moment and stated he was going to take a nap. He ate 100% of breakfast

## 2022-06-05 NOTE — ED Notes (Signed)
Changed pt's purewick, and chuck pad. Performed pericare. Pt stated that his upper posterior thigh on both side were itching. Per pt's direction, this RN used a towel to "rub/scratch" that area. Very slight touch was used with a towel and after a few wipes the skin started to look flakey. The rubbing/scratching was stopped immediately upon seeing this and barrier cream was applied to the area.

## 2022-06-05 NOTE — ED Notes (Signed)
Patient is resting

## 2022-06-05 NOTE — Progress Notes (Signed)
Physical Therapy Treatment Patient Details Name: Gary [name] MRN: [id number] DOB: 06/27/1955 Today's Date: 06/05/2022   History of Present Illness Gary [name] is a 56yoM who comes to [name] on 04/26/21 reports his self care needs not being met after prior DC, staffing issues in home and reportedly not able to get his medications. Pt was DC from here 10 days prior after admission for respiratory distress, found to have active influenza infection, also CHF exacerbation. APS now involved in pt's case and he is in process of applying for medicare and obtaining facility placement. 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 At baseline, pt is lift transfers and total care +2, nonambulatory despite extensive time working with rehab in acute setting over several months with goals of maximizing independence in bed mobility and transfers.    PT Comments    Patient is agreeable to PT session and motivated to try walking. The patient was able to stand x 2 times from bed height of 30.5 inches with minimal assistance. This is an improvement from standing yesterday from bed height of 31 inches. The patient ambulated 40f with rolling walker, occasional cues for safety and taking rest breaks as needed. One standing rest break required and chair follow for safety. The patient is making progress with increased activity tolerance in standing. Recommend to continue PT to maximize independence and decrease caregiver burden.    Recommendations for follow up therapy are one component of a multi-disciplinary discharge planning process, led by the attending physician.  Recommendations may be updated based on patient status, additional functional criteria and insurance authorization.  Follow Up Recommendations  Acute inpatient rehab (3hours/day) Can patient physically be transported by private vehicle: No   Assistance Recommended at Discharge  Intermittent Supervision/Assistance  Patient can return home with the following Two people to help with bathing/dressing/bathroom;Assistance with cooking/housework;Assist for transportation;Help with stairs or ramp for entrance;Two people to help with walking and/or transfers;Direct supervision/assist for medications management;Direct supervision/assist for financial management   Equipment Recommendations  None recommended by PT    Recommendations for Other Services       Precautions / Restrictions Precautions Precautions: Fall Restrictions Weight Bearing Restrictions: No     Mobility  Bed Mobility Overal bed mobility: Needs Assistance Bed Mobility: Supine to Sit     Supine to sit: Min assist     General bed mobility comments: LLE support provided as well as intermittent trunk support. cues for technique    Transfers Overall transfer level: Needs assistance Equipment used: Rolling walker (2 wheels) Transfers: Sit to/from Stand Sit to Stand: From elevated surface, Min assist           General transfer comment: 2 bouts of standing with bed height at 30.5 inches which is an improvement from yesterday    Ambulation/Gait Ambulation/Gait assistance: Min guard, +2 safety/equipment Gait Distance (Feet): 20 Feet Assistive device: Rolling walker (2 wheels) (bariatric rolling walker) Gait Pattern/deviations: Step-through pattern, Step-to pattern, Trunk flexed, Decreased stride length Gait velocity: decreased     General Gait Details: slow but steady cadence. encouragement provided to take standing rest breaks as needed with a standing rest break reuqired. this is the farthest the patient has walked during this hospital stay. chair following for safety   Stairs             Wheelchair Mobility    Modified Rankin (Stroke Patients Only)       Balance Overall  balance assessment: Needs assistance Sitting-balance support: Feet supported Sitting balance-[name] Scale:  Good     Standing balance support: Bilateral upper extremity supported Standing balance-[name] Scale: Fair                              Cognition Arousal/Alertness: Awake/alert Behavior During Therapy: WFL for tasks assessed/performed Overall Cognitive Status: Within Functional Limits for tasks assessed                                 General Comments: patient sleeping on arrival to room. he is motivated to participate        Exercises      General Comments General comments (skin integrity, edema, etc.): patient sitting up in chair at end of session and thanks therapist for working with him. he is eager for PT to return tomorrow for continued ambulation efforts      Pertinent Vitals/Pain Pain Assessment Pain Assessment: No/denies pain    Home Living                          Prior Function            PT Goals (current goals can now be found in the care plan section) Acute Rehab PT Goals Patient Stated Goal: to walk and go home to his own apartment PT Goal Formulation: With patient Time For Goal Achievement: 06/07/22 Potential to Achieve Goals: Fair Progress towards PT goals: Progressing toward goals    Frequency    Min 2X/week      PT Plan Current plan remains appropriate    Co-evaluation PT/OT/SLP Co-Evaluation/Treatment: Yes Reason for Co-Treatment: For patient/therapist safety;To address functional/ADL transfers PT goals addressed during session: Mobility/safety with mobility OT goals addressed during session: ADL's and self-care      AM-PAC PT "6 Clicks" Mobility   Outcome Measure  Help needed turning from your back to your side while in a flat bed without using bedrails?: A Lot Help needed moving from lying on your back to sitting on the side of a flat bed without using bedrails?: A Lot Help needed moving to and from a bed to a chair (including a wheelchair)?: A Lot Help needed standing up from a chair using  your arms (e.g., wheelchair or bedside chair)?: Total Help needed to walk in hospital room?: Total Help needed climbing 3-5 steps with a railing? : Total 6 Click Score: 9    End of Session Equipment Utilized During Treatment: Oxygen Activity Tolerance: Patient tolerated treatment well Patient left: in chair;with call bell/phone within reach   PT Visit Diagnosis: Unsteadiness on feet (R26.81);Muscle weakness (generalized) (M62.81);Difficulty in walking, not elsewhere classified (R26.2)     Time: [id number] PT Time Calculation (min) (ACUTE ONLY): 34 min  Charges:  $Therapeutic Activity: 8-22 mins                    Gary [name], PT, MPT    [name] 06/05/2022, 2:00 PM

## 2022-06-05 NOTE — Progress Notes (Signed)
   06/05/22 1600  Spiritual Encounters  Type of Visit Initial  Care provided to: Patient  Referral source Nurse (RN/NT/LPN)  Reason for visit Code  OnCall Visit Yes   Chaplain responded to code stroke. Patient engaged in conversation and spoke about PT earlier in the day. Chaplain provided compassionate presence and reflective listening as patient spoke about health challenges. Patient appreciated [place] visit.

## 2022-06-05 NOTE — Progress Notes (Signed)
Occupational Therapy Treatment Patient Details Name: Gary [name] MRN: [id number] DOB: [birth year] Today's Date: 06/05/2022   History of present illness Gary [name] is a 67yoM who comes to [facility] on 04/26/21 reports his self care needs not being met after prior DC, staffing issues in home and reportedly not able to get his medications. Pt was DC from here 10 days prior after admission for respiratory distress, found to have active influenza infection, also CHF exacerbation. APS now involved in pt's case and he is in process of applying for medicare and obtaining facility placement. 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 At baseline, pt is lift transfers and total care +2, nonambulatory despite extensive time working with rehab in acute setting over several months with goals of maximizing independence in bed mobility and transfers.   OT comments  Gary [name] was seen for OT treatment on this date. Upon arrival to room pt asleep in bed, agreeable to tx. Pt requires  MAX A don B shoes in sitting. CGA x2 + RW sit<>stand x2 and ~20 ft functional mobility, +2 for equipment. SETUP don/doff gown in sitting. Pt making good progress toward goals, will continue to follow POC. Discharge recommendation remains appropriate.     Recommendations for follow up therapy are one component of a multi-disciplinary discharge planning process, led by the attending physician.  Recommendations may be updated based on patient status, additional functional criteria and insurance authorization.    Follow Up Recommendations  Acute inpatient rehab (3hours/day)     Assistance Recommended at Discharge Frequent or constant Supervision/Assistance  Patient can return home with the following  Two people to help with walking and/or transfers;Two people to help with bathing/dressing/bathroom;Assistance with cooking/housework;Help with stairs or ramp for  entrance;Assist for transportation   Equipment Recommendations  Hospital bed    Recommendations for Other Services      Precautions / Restrictions Precautions Precautions: Fall Restrictions Weight Bearing Restrictions: No       Mobility Bed Mobility Overal bed mobility: Needs Assistance Bed Mobility: Supine to Sit     Supine to sit: Min assist     General bed mobility comments: assist for BLE    Transfers Overall transfer level: Needs assistance Equipment used: Rolling walker (2 wheels) Transfers: Sit to/from Stand Sit to Stand: Min guard, From elevated surface           General transfer comment: bed height 30.5"     Balance Overall balance assessment: Needs assistance Sitting-balance support: Feet supported Sitting balance-[name] Scale: Good     Standing balance support: Bilateral upper extremity supported, Reliant on assistive device for balance Standing balance-[name] Scale: Fair                             ADL either performed or assessed with clinical judgement   ADL Overall ADL's : Needs assistance/impaired                                       General ADL Comments: MAX A don B shoes in sitting. CGA x2 + RW simulated toilet t/f. SETUP don/doff gown in sitting      Cognition Arousal/Alertness: Awake/alert Behavior During Therapy: WFL for tasks assessed/performed Overall Cognitive Status: Within Functional Limits for tasks assessed  Pertinent Vitals/ Pain       Pain Assessment Pain Assessment: No/denies pain   Frequency  Min 2X/week        Progress Toward Goals  OT Goals(current goals can now be found in the care plan section)  Progress towards OT goals: Progressing toward goals  Acute Rehab OT Goals Patient Stated Goal: to go home OT Goal Formulation: With patient Time For Goal Achievement: 06/18/22 Potential to Achieve Goals:  Fair ADL Goals Pt Will Perform Grooming: sitting;with modified independence Pt Will Perform Lower Body Dressing: with min assist;sitting/lateral leans;with adaptive equipment Pt Will Transfer to Toilet: bedside commode;stand pivot transfer;with min assist;with +2 assist Pt/caregiver will Perform Home Exercise Program: Increased strength;Both right and left upper extremity;With theraband;With written HEP provided;Independently  Plan Discharge plan remains appropriate;Frequency remains appropriate    Co-evaluation    PT/OT/SLP Co-Evaluation/Treatment: Yes Reason for Co-Treatment: For patient/therapist safety;To address functional/ADL transfers PT goals addressed during session: Mobility/safety with mobility OT goals addressed during session: ADL's and self-care      AM-PAC OT "6 Clicks" Daily Activity     Outcome Measure   Help from another person eating meals?: None Help from another person taking care of personal grooming?: A Little Help from another person toileting, which includes using toliet, bedpan, or urinal?: A Lot Help from another person bathing (including washing, rinsing, drying)?: A Lot Help from another person to put on and taking off regular upper body clothing?: A Little Help from another person to put on and taking off regular lower body clothing?: A Lot 6 Click Score: 16    End of Session Equipment Utilized During Treatment: Rolling walker (2 wheels)  OT Visit Diagnosis: Other abnormalities of gait and mobility (R26.89);Muscle weakness (generalized) (M62.81)   Activity Tolerance Patient tolerated treatment well   Patient Left with call bell/phone within reach;in chair   Nurse Communication          Time: [id number] OT Time Calculation (min): 36 min  Charges: OT General Charges $OT Visit: 1 Visit OT Treatments $Self Care/Home Management : 8-22 mins  [name], M.S. OTR/L  06/05/22, 1:57 PM  ascom [phone]

## 2022-06-05 NOTE — ED Notes (Signed)
Pt given ginger ale and sandwich tray per request and night time medications.

## 2022-06-06 NOTE — ED Notes (Signed)
Pt provided cranberry juice per request. No additional needs voiced at this time

## 2022-06-06 NOTE — ED Notes (Signed)
PT/OT at bedside.

## 2022-06-06 NOTE — ED Notes (Signed)
Pt's male [place] changed. Food tray delivered. No other complaints at this time.

## 2022-06-06 NOTE — Progress Notes (Signed)
Physical Therapy Treatment Patient Details Name: Gary [name] MRN: [id number] DOB: [birth year] Today's Date: 06/06/2022   History of Present Illness Gary [name] is a 69yoM who comes to [facility] on 04/26/21 reports his self care needs not being met after prior DC, staffing issues in home and reportedly not able to get his medications. Pt was DC from here 10 days prior after admission for respiratory distress, found to have active influenza infection, also CHF exacerbation. APS now involved in pt's case and he is in process of applying for medicare and obtaining facility placement. 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 At baseline, pt is lift transfers and total care +2, nonambulatory despite extensive time working with rehab in acute setting over several months with goals of maximizing independence in bed mobility and transfers.    PT Comments    Patient is standing from a lower height bed surface today of 30 inches. He walked 2 bouts with chair following and one seated rest break required between the two bouts of walking (19f and 123f. Activity tolerance limited by fatigue. Patient is motivated to regain independence for eventual return home. PT will continue to follow to maximize independence.    Recommendations for follow up therapy are one component of a multi-disciplinary discharge planning process, led by the attending physician.  Recommendations may be updated based on patient status, additional functional criteria and insurance authorization.  Follow Up Recommendations  Acute inpatient rehab (3hours/day) Can patient physically be transported by private vehicle: No   Assistance Recommended at Discharge Intermittent Supervision/Assistance  Patient can return home with the following Two people to help with bathing/dressing/bathroom;Assistance with cooking/housework;Assist for transportation;Help with stairs or ramp for  entrance;Two people to help with walking and/or transfers;Direct supervision/assist for medications management;Direct supervision/assist for financial management   Equipment Recommendations  None recommended by PT    Recommendations for Other Services       Precautions / Restrictions Precautions Precautions: Fall Restrictions Weight Bearing Restrictions: No     Mobility  Bed Mobility Overal bed mobility: Needs Assistance Bed Mobility: Supine to Sit     Supine to sit: Min assist     General bed mobility comments: assistance for LLE support    Transfers Overall transfer level: Needs assistance Equipment used: Rolling walker (2 wheels) Transfers: Sit to/from Stand Sit to Stand: Min assist, +2 physical assistance           General transfer comment: bed height of 30 inches. standing performed x 2 from bed and x 1 from elevated recliner chair. Min A initially progressing to +2 assistance required for subsequent stands    Ambulation/Gait Ambulation/Gait assistance: Min guard, +2 safety/equipment Gait Distance (Feet):  (2016f 1 and [id number]) Assistive device: Rolling walker (2 wheels) Gait Pattern/deviations: Step-through pattern, Step-to pattern, Trunk flexed, Decreased stride length Gait velocity: decreased     General Gait Details: slow but steady cadence. encouragement provided to take standing rest breaks as needed with a standing rest break reuqired. chair following for safety with one seated rest break between walking bouts   Stairs             Wheelchair Mobility    Modified Rankin (Stroke Patients Only)       Balance Overall balance assessment: Needs assistance Sitting-balance support: Feet supported Sitting balance-[name] Scale: Good     Standing balance support: Bilateral upper extremity supported, Reliant on assistive device for balance  Standing balance-[name] Scale: Fair                              Cognition  Arousal/Alertness: Awake/alert Behavior During Therapy: WFL for tasks assessed/performed Overall Cognitive Status: Within Functional Limits for tasks assessed                                          Exercises      General Comments        Pertinent Vitals/Pain Pain Assessment Pain Assessment: No/denies pain    Home Living                          Prior Function            PT Goals (current goals can now be found in the care plan section) Acute Rehab PT Goals Patient Stated Goal: to walk and go home to his own apartment PT Goal Formulation: With patient Time For Goal Achievement: 06/07/22 Potential to Achieve Goals: Fair Progress towards PT goals: Progressing toward goals    Frequency    Min 2X/week      PT Plan Current plan remains appropriate    Co-evaluation PT/OT/SLP Co-Evaluation/Treatment: Yes Reason for Co-Treatment: For patient/therapist safety;To address functional/ADL transfers PT goals addressed during session: Mobility/safety with mobility OT goals addressed during session: ADL's and self-care      AM-PAC PT "6 Clicks" Mobility   Outcome Measure  Help needed turning from your back to your side while in a flat bed without using bedrails?: A Lot Help needed moving from lying on your back to sitting on the side of a flat bed without using bedrails?: A Lot Help needed moving to and from a bed to a chair (including a wheelchair)?: A Lot Help needed standing up from a chair using your arms (e.g., wheelchair or bedside chair)?: Total Help needed to walk in hospital room?: Total Help needed climbing 3-5 steps with a railing? : Total 6 Click Score: 9    End of Session Equipment Utilized During Treatment: Oxygen Activity Tolerance: Patient tolerated treatment well Patient left: in chair;with call bell/phone within reach   PT Visit Diagnosis: Unsteadiness on feet (R26.81);Muscle weakness (generalized) (M62.81);Difficulty in  walking, not elsewhere classified (R26.2)     Time: [id number] PT Time Calculation (min) (ACUTE ONLY): 42 min  Charges:  $Therapeutic Activity: 8-22 mins                     [name], PT, MPT    [name] 06/06/2022, 3:36 PM

## 2022-06-06 NOTE — Progress Notes (Signed)
Patient assisted with coming off his home vent and being placed back on 5L 28% ATC. All items handed to patient for patient to take himself off and put himself on oxygen devices. Minimal assistance from RT, patient performs these tasks very well independently. Patient also put his PMV on. Water bottle refilled for humidity.

## 2022-06-06 NOTE — Progress Notes (Signed)
Occupational Therapy Treatment Patient Details Name: Gary [name] MRN: [id number] DOB: September 26, 1955 Today's Date: 06/06/2022   History of present illness Tameem Hockenbury is a 89yoM who comes to [facility] on 04/26/21 reports his self care needs not being met after prior DC, staffing issues in home and reportedly not able to get his medications. Pt was DC from here 10 days prior after admission for respiratory distress, found to have active influenza infection, also CHF exacerbation. APS now involved in pt's case and he is in process of applying for medicare and obtaining facility placement. 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 At baseline, pt is lift transfers and total care +2, nonambulatory despite extensive time working with rehab in acute setting over several months with goals of maximizing independence in bed mobility and transfers.   OT comments  Mr Jacko was seen for OT treatment on this date. Upon arrival to room pt reclined in bed, agreeable to tx. Pt requires CGA sit<>stand x2 from bed height 30" (lower than previous date). Pt tolerated 2 bouts functional mobility: 20 ft x 12 ft. Pt making good progress toward goals, will continue to follow POC. Discharge recommendation remains appropriate.     Recommendations for follow up therapy are one component of a multi-disciplinary discharge planning process, led by the attending physician.  Recommendations may be updated based on patient status, additional functional criteria and insurance authorization.    Follow Up Recommendations  Acute inpatient rehab (3hours/day)     Assistance Recommended at Discharge Frequent or constant Supervision/Assistance  Patient can return home with the following  Two people to help with walking and/or transfers;Two people to help with bathing/dressing/bathroom;Assistance with cooking/housework;Help with stairs or ramp for entrance;Assist for  transportation   Equipment Recommendations  Hospital bed    Recommendations for Other Services      Precautions / Restrictions Precautions Precautions: Fall Restrictions Weight Bearing Restrictions: No       Mobility Bed Mobility Overal bed mobility: Needs Assistance Bed Mobility: Supine to Sit     Supine to sit: Min assist     General bed mobility comments: assist for BLE    Transfers Overall transfer level: Needs assistance Equipment used: Rolling walker (2 wheels) Transfers: Sit to/from Stand Sit to Stand: Min guard, From elevated surface           General transfer comment: bed height 30", MIN A standing from chair     Balance Overall balance assessment: Needs assistance Sitting-balance support: Feet supported Sitting balance-[name] Scale: Good     Standing balance support: Bilateral upper extremity supported, Reliant on assistive device for balance Standing balance-[name] Scale: Fair                             ADL either performed or assessed with clinical judgement   ADL Overall ADL's : Needs assistance/impaired                                       General ADL Comments: MAX A don B shoes in sitting. CGA x2 + RW simulated toilet t/f.      Cognition Arousal/Alertness: Awake/alert Behavior During Therapy: WFL for tasks assessed/performed Overall Cognitive Status: Within Functional Limits for tasks assessed  Exercises Other Exercises Other Exercises: tolerated 2 bouts functional mobility: 20 ft x 12 ft      Frequency  Min 2X/week        Progress Toward Goals  OT Goals(current goals can now be found in the care plan section)  Progress towards OT goals: Progressing toward goals  Acute Rehab OT Goals Patient Stated Goal: go home OT Goal Formulation: With patient Time For Goal Achievement: 06/18/22 Potential to Achieve Goals: Fair ADL Goals Pt  Will Perform Grooming: sitting;with modified independence Pt Will Perform Lower Body Dressing: with min assist;sitting/lateral leans;with adaptive equipment Pt Will Transfer to Toilet: bedside commode;stand pivot transfer;with min assist;with +2 assist Pt/caregiver will Perform Home Exercise Program: Increased strength;Both right and left upper extremity;With theraband;With written HEP provided;Independently  Plan Discharge plan remains appropriate;Frequency remains appropriate    Co-evaluation    PT/OT/SLP Co-Evaluation/Treatment: Yes Reason for Co-Treatment: For patient/therapist safety;To address functional/ADL transfers PT goals addressed during session: Mobility/safety with mobility OT goals addressed during session: ADL's and self-care      AM-PAC OT "6 Clicks" Daily Activity     Outcome Measure   Help from another person eating meals?: None Help from another person taking care of personal grooming?: A Little Help from another person toileting, which includes using toliet, bedpan, or urinal?: A Lot Help from another person bathing (including washing, rinsing, drying)?: A Lot Help from another person to put on and taking off regular upper body clothing?: A Little Help from another person to put on and taking off regular lower body clothing?: A Lot 6 Click Score: 16    End of Session Equipment Utilized During Treatment: Rolling walker (2 wheels)  OT Visit Diagnosis: Other abnormalities of gait and mobility (R26.89);Muscle weakness (generalized) (M62.81)   Activity Tolerance Patient tolerated treatment well   Patient Left with call bell/phone within reach;in chair   Nurse Communication          Time: [id number] OT Time Calculation (min): 44 min  Charges: OT General Charges $OT Visit: 1 Visit OT Treatments $Self Care/Home Management : 8-22 mins $Therapeutic Activity: 8-22 mins  [name], M.S. OTR/L  06/06/22, 3:45 PM  ascom [phone]

## 2022-06-06 NOTE — ED Provider Notes (Signed)
Emergency Medicine Observation Re-evaluation Note  Physical Exam   BP 116/74   Pulse 78   Temp 98.2 F (36.8 C)   Resp 19   Ht 6' 3"$  (1.905 m)   Wt (!) 177.4 kg   SpO2 99%   BMI 48.87 kg/m   Patient resting, unlabored breathing, no acute distress.  ED Course / MDM   No reported events during my shift at the time of this note.   Pt is awaiting dispo from SW   Lucillie Garfinkel MD    Lucillie Garfinkel, MD 06/06/22 [phone]

## 2022-06-06 NOTE — ED Notes (Signed)
Pt called RN to room requesting that door be left cracked. RN obliged. Pt noted to be resting in bed watching tv. Breathing unlabored, speaking in full sentences with symmetric chest rise and fall. Pt bedside table and belongings all within pt reach. Pt requested 3 packs graham crackers and peanut butter. Snack provided to pt. Pt denies pain or further needs at this time. Pt bed is low and locked with side rails raised.

## 2022-06-06 NOTE — ED Notes (Signed)
Pt asking for meal tray at this time. Order for no snacking in patients chart. Informed patient that this order exists and patient reports that he has been getting a sandwich tray every night. Pt states that the MD had no business putting that order in and taking his medication on an empty stomach makes him sick. Compromise was made and pt received singular sandwich until further discussion can be had with provider tomorrow.

## 2022-06-06 NOTE — Progress Notes (Signed)
Patient assisted minimally with placing himself on his home ventilator. 3L O2 was bled in. Patient has good knowledge and understanding of how to use his devices. PT stable at this time.

## 2022-06-06 NOTE — ED Notes (Signed)
Pt resting comfortably in bed at this time. No needs or complaints vocalized. Call bell in reach. Introduced self to patient.

## 2022-06-07 NOTE — Progress Notes (Signed)
Physical Therapy Treatment Patient Details Name: Gary [name] MRN: [id number] DOB: 06/29/1955 Today's Date: 06/07/2022   History of Present Illness Gary [name] is a 57yoM who comes to [facility] on 04/26/21 reports his self care needs not being met after prior DC, staffing issues in home and reportedly not able to get his medications. Pt was DC from here 10 days prior after admission for respiratory distress, found to have active influenza infection, also CHF exacerbation. APS now involved in pt's case and he is in process of applying for medicare and obtaining facility placement. 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    PT Comments    Patient is agreeable to PT. He was able to stand from 29.5 inch surface, which is an improvement from yesterday. Two standing bouts performed from bed with Min guard assistance for safety. Patient ambulated 43f with rolling walker with Min guard assistance and chair follow for safety. Patient then performed stand step transfer with rolling walker with Min A from recliner chair to bed. Activity tolerance limited by fatigue. Recommend PT to maximize independence and decrease caregiver burden.    Recommendations for follow up therapy are one component of a multi-disciplinary discharge planning process, led by the attending physician.  Recommendations may be updated based on patient status, additional functional criteria and insurance authorization.  Follow Up Recommendations  Acute inpatient rehab (3hours/day) Can patient physically be transported by private vehicle: No   Assistance Recommended at Discharge Intermittent Supervision/Assistance  Patient can return home with the following Two people to help with bathing/dressing/bathroom;Assistance with cooking/housework;Assist for transportation;Help with stairs or ramp for entrance;Two people to help with walking and/or transfers;Direct  supervision/assist for medications management;Direct supervision/assist for financial management   Equipment Recommendations  None recommended by PT    Recommendations for Other Services       Precautions / Restrictions Precautions Precautions: Fall Restrictions Weight Bearing Restrictions: No     Mobility  Bed Mobility Overal bed mobility: Needs Assistance Bed Mobility: Supine to Sit, Sit to Supine     Supine to sit: Min assist Sit to supine: Mod assist   General bed mobility comments: assistance for LLE and trunk support to sit upright. assistance for LE support to return to bed. verbal cues for technique. increased time and effort required    Transfers Overall transfer level: Needs assistance Equipment used: Rolling walker (2 wheels) Transfers: Sit to/from Stand Sit to Stand: Min guard, From elevated surface   Step pivot transfers: Min assist       General transfer comment: bed height 29.5 inches today (down by 1/2 inch from yesterday). cues for anterior weight shifting, hand placement. Min gaurd for standing x 2 bouts from the bed. Min A for step pivot transfer from chair to bed.    Ambulation/Gait Ambulation/Gait assistance: Min guard, +2 safety/equipment Gait Distance (Feet): 10 Feet Assistive device: Rolling walker (2 wheels) Gait Pattern/deviations: Step-through pattern Gait velocity: decreased     General Gait Details: slow cadence with reinforcement of step length. standing rest break required. chair follow for safety.   Stairs             Wheelchair Mobility    Modified Rankin (Stroke Patients Only)       Balance Overall balance assessment: Needs assistance Sitting-balance support: Feet supported Sitting balance-[name] Scale: Good     Standing balance support: Bilateral upper extremity supported Standing balance-[name] Scale: Fair  Cognition Arousal/Alertness: Awake/alert Behavior During  Therapy: WFL for tasks assessed/performed Overall Cognitive Status: Within Functional Limits for tasks assessed                                          Exercises      General Comments        Pertinent Vitals/Pain Pain Assessment Pain Assessment: No/denies pain    Home Living                          Prior Function            PT Goals (current goals can now be found in the care plan section) Acute Rehab PT Goals Patient Stated Goal: to walk and go home to his own apartment PT Goal Formulation: With patient Time For Goal Achievement: 06/21/22 Potential to Achieve Goals: Fair Progress towards PT goals: Progressing toward goals    Frequency    Min 2X/week      PT Plan Current plan remains appropriate    Co-evaluation   Reason for Co-Treatment: For patient/therapist safety;To address functional/ADL transfers PT goals addressed during session: Mobility/safety with mobility OT goals addressed during session: ADL's and self-care      AM-PAC PT "6 Clicks" Mobility   Outcome Measure  Help needed turning from your back to your side while in a flat bed without using bedrails?: A Lot Help needed moving from lying on your back to sitting on the side of a flat bed without using bedrails?: A Lot Help needed moving to and from a bed to a chair (including a wheelchair)?: A Lot Help needed standing up from a chair using your arms (e.g., wheelchair or bedside chair)?: Total Help needed to walk in hospital room?: Total Help needed climbing 3-5 steps with a railing? : Total 6 Click Score: 9    End of Session Equipment Utilized During Treatment: Oxygen Activity Tolerance: Patient tolerated treatment well Patient left: in bed;with call bell/phone within reach   PT Visit Diagnosis: Unsteadiness on feet (R26.81);Muscle weakness (generalized) (M62.81);Difficulty in walking, not elsewhere classified (R26.2)     Time: 1322-1400 PT Time Calculation  (min) (ACUTE ONLY): 38 min  Charges:  $Gait Training: 8-22 mins $Therapeutic Activity: 8-22 mins                    Gary [name], PT, MPT    Gary [name] 06/07/2022, 2:47 PM

## 2022-06-07 NOTE — Progress Notes (Signed)
Patient assisted with placing himself on his home ventilator, minimal assistance from RT, PT very independent and displays good knowledge of his home device. 3L O2 was bled in through O2 adapter. PT stable at this time.

## 2022-06-07 NOTE — ED Notes (Signed)
This RN and NT Faith gave pt bed bath with complete linen change. Open spot with redness noted to pannus fold on right side.

## 2022-06-07 NOTE — Progress Notes (Signed)
Occupational Therapy Treatment Patient Details Name: Gary [name] MRN: [id number] DOB: 09-May-1955 Today's Date: 06/07/2022   History of present illness Gary [name] is a 10yoM who comes to [facility] on 04/26/21 reports his self care needs not being met after prior DC, staffing issues in home and reportedly not able to get his medications. Pt was DC from here 10 days prior after admission for respiratory distress, found to have active influenza infection, also CHF exacerbation. APS now involved in pt's case and he is in process of applying for medicare and obtaining facility placement. 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 At baseline, pt is lift transfers and total care +2, nonambulatory despite extensive time working with rehab in acute setting over several months with goals of maximizing independence in bed mobility and transfers.   OT comments  Gary [name] was seen for OT treatment on this date. Upon arrival to room pt reclined in bed, agreeable to tx. Pt requires CGA + RW sit<>stand x2 from bed height 29.5" and tolerates ~12 ft mobility. Pt stands from elevated chair for SPT>bed. Pt making good progress toward goals, will continue to follow POC. Discharge recommendation remains appropriate.     Recommendations for follow up therapy are one component of a multi-disciplinary discharge planning process, led by the attending physician.  Recommendations may be updated based on patient status, additional functional criteria and insurance authorization.    Follow Up Recommendations  Acute inpatient rehab (3hours/day)     Assistance Recommended at Discharge Frequent or constant Supervision/Assistance  Patient can return home with the following  Two people to help with walking and/or transfers;Two people to help with bathing/dressing/bathroom;Assistance with cooking/housework;Help with stairs or ramp for entrance;Assist for  transportation   Equipment Recommendations  Hospital bed    Recommendations for Other Services      Precautions / Restrictions Precautions Precautions: Fall Restrictions Weight Bearing Restrictions: No       Mobility Bed Mobility Overal bed mobility: Needs Assistance Bed Mobility: Supine to Sit     Supine to sit: Min assist     General bed mobility comments: assist for BLE    Transfers Overall transfer level: Needs assistance Equipment used: Rolling walker (2 wheels) Transfers: Sit to/from Stand Sit to Stand: Min guard, From elevated surface           General transfer comment: bed height 29.5", MIN A standing from chair     Balance Overall balance assessment: Needs assistance Sitting-balance support: Feet supported Sitting balance-[name] Scale: Good     Standing balance support: Bilateral upper extremity supported, Reliant on assistive device for balance Standing balance-[name] Scale: Fair                             ADL either performed or assessed with clinical judgement   ADL Overall ADL's : Needs assistance/impaired                                       General ADL Comments: MAX A don B shoes in sitting. CGA x2 + RW simulated toilet t/f.     Cognition Arousal/Alertness: Awake/alert Behavior During Therapy: WFL for tasks assessed/performed Overall Cognitive Status: Within Functional Limits for tasks assessed  Exercises Other Exercises Other Exercises: ~12 ft mobility and 1 SPT chair>bed            Pertinent Vitals/ Pain       Pain Assessment Pain Assessment: No/denies pain   Frequency  Min 2X/week        Progress Toward Goals  OT Goals(current goals can now be found in the care plan section)  Progress towards OT goals: Progressing toward goals  Acute Rehab OT Goals Patient Stated Goal: go home OT Goal Formulation: With patient Time For Goal  Achievement: 06/18/22 Potential to Achieve Goals: Fair ADL Goals Pt Will Perform Grooming: sitting;with modified independence Pt Will Perform Lower Body Dressing: with min assist;sitting/lateral leans;with adaptive equipment Pt Will Transfer to Toilet: bedside commode;stand pivot transfer;with min assist;with +2 assist Pt/caregiver will Perform Home Exercise Program: Increased strength;Both right and left upper extremity;With theraband;With written HEP provided;Independently  Plan Discharge plan remains appropriate;Frequency remains appropriate    Co-evaluation    PT/OT/SLP Co-Evaluation/Treatment: Yes Reason for Co-Treatment: For patient/therapist safety;To address functional/ADL transfers PT goals addressed during session: Mobility/safety with mobility OT goals addressed during session: ADL's and self-care      AM-PAC OT "6 Clicks" Daily Activity     Outcome Measure   Help from another person eating meals?: None Help from another person taking care of personal grooming?: A Little Help from another person toileting, which includes using toliet, bedpan, or urinal?: A Lot Help from another person bathing (including washing, rinsing, drying)?: A Lot Help from another person to put on and taking off regular upper body clothing?: A Little Help from another person to put on and taking off regular lower body clothing?: A Lot 6 Click Score: 16    End of Session Equipment Utilized During Treatment: Rolling walker (2 wheels)  OT Visit Diagnosis: Other abnormalities of gait and mobility (R26.89);Muscle weakness (generalized) (M62.81)   Activity Tolerance Patient tolerated treatment well   Patient Left with call bell/phone within reach;in chair   Nurse Communication          Time: 1322-1400 OT Time Calculation (min): 38 min  Charges: OT General Charges $OT Visit: 1 Visit OT Treatments $Self Care/Home Management : 8-22 mins  [name], M.S. OTR/L  06/07/22, 2:17 PM  ascom  [phone]

## 2022-06-07 NOTE — ED Provider Notes (Signed)
-----------------------------------------   6:41 AM on 06/07/2022 -----------------------------------------   Blood pressure 108/61, pulse 80, temperature 98 F (36.7 C), temperature source Oral, resp. rate 16, height 6' 3"$  (1.905 m), weight (!) 177.4 kg, SpO2 95 %.  The patient is calm and cooperative at this time.  There have been no acute events since the last update.  Awaiting disposition plan from Social Work team.   Paulette Blanch, MD 06/07/22 [phone]

## 2022-06-07 NOTE — Progress Notes (Signed)
Patient assisted with taking himself off his home vent and ATM given to him, and patient placed ATM securely around his neck. ATM on 5L and 28%, with PT SpO2 at 95% and currently stable. Humidifier water refilled.

## 2022-06-08 NOTE — ED Notes (Signed)
Pt called out with call bell stating he needed to use bedpan, this RN about to assist patient onto bedpan when he states it went away and doesn't need it at the moment.  Patient will call out if needs to have BM again.  Urine container emptied, denies other needs at this time, pt resting comfortably.

## 2022-06-08 NOTE — ED Notes (Signed)
Pt has lunch tray nearby. Stated he wanted to wait until PT helped him into chair before eating. Denies any needs at this time.

## 2022-06-08 NOTE — ED Notes (Signed)
Pt awake requesting something to drink, orange juice given per request.

## 2022-06-08 NOTE — ED Provider Notes (Signed)
-----------------------------------------   5:20 AM on 06/08/2022 -----------------------------------------   Blood pressure 115/78, pulse 80, temperature 97.8 F (36.6 C), temperature source Oral, resp. rate 18, height 6' 3"$  (1.905 m), weight (!) 177.4 kg, SpO2 93 %.  The patient is calm and cooperative at this time.  There have been no acute events since the last update.  Awaiting disposition plan from Social Work team.   Paulette Blanch, MD 06/08/22 [phone]

## 2022-06-08 NOTE — TOC Progression Note (Addendum)
Transition of Care [facility]) - Progression Note    Patient Details  Name: Gary [name] MRN: [id number] Date of Birth: 06-16-1955  Transition of [facility]) CM/SW Contact  Shelbie Hutching, RN Phone Number: 06/08/2022, 3:17 PM  Clinical Narrative:    [name], they said they would be happy to look at referral- faxed to [phone].    Patient has been working really well with therapy, if he continues to make progress there is a real possibility that he could be successful at home.     Expected Discharge Plan: [place] Barriers to Discharge: ED Unsafe disposition  Expected Discharge Plan and Services   Discharge Planning Services: CM Consult Post Acute Care Choice: [place] Living arrangements for the past 2 months: Single Family Home                 DME Arranged: N/A DME Agency: NA                   Social Determinants of Health (SDOH) Interventions SDOH Screenings   Food Insecurity: No Food Insecurity (04/15/2022)  Housing: Low Risk  (04/15/2022)  Transportation Needs: Unmet Transportation Needs (04/15/2022)  Utilities: Not At Risk (04/15/2022)  Recent Concern: Utilities - At Risk (02/05/2022)  Depression (PHQ2-9): Low Risk  (06/07/2021)  Financial Resource Strain: Medium Risk (05/03/2017)  Physical Activity: Insufficiently Active (05/03/2017)  Social Connections: Moderately Integrated (05/03/2017)  Stress: No Stress Concern Present (05/03/2017)  Tobacco Use: Medium Risk (04/26/2022)    Readmission Risk Interventions    12/31/2021    8:58 AM 12/20/2021    4:21 PM 05/04/2021    1:26 PM  Readmission Risk Prevention Plan  Transportation Screening Complete Complete Complete  Medication Review Press photographer) Complete Complete Complete  PCP or Specialist appointment within 3-5 days of discharge Complete Complete Complete  HRI or Home Care Consult Complete Complete Complete  SW Recovery  Care/Counseling Consult Complete Complete Complete  Palliative Care Screening Complete Complete Not [place] Not Applicable Not Applicable Not Applicable

## 2022-06-08 NOTE — Progress Notes (Signed)
Physical Therapy Treatment Patient Details Name: Gary [name] MRN: [id number] DOB: [birth year] Today's Date: 06/08/2022   History of Present Illness Gary [name] is a 73yoM who comes to [facility] on 04/26/21 reports his self care needs not being met after prior DC, staffing issues in home and reportedly not able to get his medications. Pt was DC from here 10 days prior after admission for respiratory distress, found to have active influenza infection, also CHF exacerbation. APS now involved in pt's case and he is in process of applying for medicare and obtaining facility placement. 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     PT Comments    The patient walked two bouts today with seated rest break between bouts of walking, totaling 26f and 267f Standing rest break also required and encouraged as needed for fatigue. Emphasis on importance of standing from lower height surfaces and using lower extremity strength to facilitate standing. Several transfers performed including sit to stand from bed and recliner chair, as well as stand step transfer from recliner chair to bed. Bed height today was 30 inches and patient required Min A. With a higher height, patient only needs Min guard assistance. Patient is making progress with functional independence and needs continued PT. Have asked mobility team to see over the weekend.    Recommendations for follow up therapy are one component of a multi-disciplinary discharge planning process, led by the attending physician.  Recommendations may be updated based on patient status, additional functional criteria and insurance authorization.  Follow Up Recommendations  Acute inpatient rehab (3hours/day) Can patient physically be transported by private vehicle: No   Assistance Recommended at Discharge Intermittent Supervision/Assistance  Patient can return home with the following Two people to help  with bathing/dressing/bathroom;Assistance with cooking/housework;Assist for transportation;Help with stairs or ramp for entrance;Two people to help with walking and/or transfers;Direct supervision/assist for medications management;Direct supervision/assist for financial management   Equipment Recommendations  None recommended by PT    Recommendations for Other Services       Precautions / Restrictions Precautions Precautions: Fall Restrictions Weight Bearing Restrictions: No     Mobility  Bed Mobility Overal bed mobility: Needs Assistance Bed Mobility: Supine to Sit, Sit to Supine     Supine to sit: Min assist Sit to supine: Mod assist   General bed mobility comments: assistance for trunk support and BLE to sit upright, incrementally with rest breaks. assistance for BLE to return to bed    Transfers Overall transfer level: Needs assistance Equipment used: Rolling walker (2 wheels) Transfers: Sit to/from Stand Sit to Stand: Min guard   Step pivot transfers: Min guard       General transfer comment: bed height at 30 inches today, Min A for standing from bed. Min guard for sit to stand from chair. Min A for stand step from recliner to bed. verbal cues for technique and to use LE strength to facilitate standing    Ambulation/Gait Ambulation/Gait assistance: Min assist, +2 safety/equipment Gait Distance (Feet):  (2631fnd 18f48fssistive device: Rolling walker (2 wheels) Gait Pattern/deviations: Step-through pattern Gait velocity: decreased     General Gait Details: 2 bouts of walking performed with seated rest break between walking bouts. slow cadence with reinforcement of step length. standing rest break required with each ambulation bout. chair follow for safety.   Stairs             WheeEmergency planning/management officer  Modified Rankin (Stroke Patients Only)       Balance Overall balance assessment: Needs assistance Sitting-balance support: Feet supported Sitting  balance-[name] Scale: Normal     Standing balance support: Bilateral upper extremity supported, Reliant on assistive device for balance Standing balance-[name] Scale: Fair                              Cognition Arousal/Alertness: Awake/alert Behavior During Therapy: WFL for tasks assessed/performed Overall Cognitive Status: Within Functional Limits for tasks assessed                                          Exercises      General Comments        Pertinent Vitals/Pain Pain Assessment Pain Assessment: No/denies pain    Home Living                          Prior Function            PT Goals (current goals can now be found in the care plan section) Acute Rehab PT Goals Patient Stated Goal: to be able to stand from a wheelchair PT Goal Formulation: With patient Time For Goal Achievement: 06/21/22 Potential to Achieve Goals: Fair Progress towards PT goals: Progressing toward goals    Frequency    Min 2X/week      PT Plan Current plan remains appropriate    Co-evaluation PT/OT/SLP Co-Evaluation/Treatment: Yes (partial) Reason for Co-Treatment: For patient/therapist safety;To address functional/ADL transfers PT goals addressed during session: Mobility/safety with mobility OT goals addressed during session: ADL's and self-care      AM-PAC PT "6 Clicks" Mobility   Outcome Measure  Help needed turning from your back to your side while in a flat bed without using bedrails?: A Lot Help needed moving from lying on your back to sitting on the side of a flat bed without using bedrails?: A Lot Help needed moving to and from a bed to a chair (including a wheelchair)?: A Lot Help needed standing up from a chair using your arms (e.g., wheelchair or bedside chair)?: Total Help needed to walk in hospital room?: Total Help needed climbing 3-5 steps with a railing? : Total 6 Click Score: 9    End of Session Equipment Utilized During  Treatment: Oxygen Activity Tolerance: Patient tolerated treatment well Patient left: in bed;with call bell/phone within reach (chair position in bed, set-up with lunch)   PT Visit Diagnosis: Unsteadiness on feet (R26.81);Muscle weakness (generalized) (M62.81);Difficulty in walking, not elsewhere classified (R26.2)     Time: 1306-1400 PT Time Calculation (min) (ACUTE ONLY): 54 min  Charges:  $Gait Training: 8-22 mins $Therapeutic Activity: 23-37 mins                     [name], PT, MPT    [name] 06/08/2022, 2:27 PM

## 2022-06-08 NOTE — Progress Notes (Signed)
Patient assisted with placing himself on his home vent, 3L O2 bled in. Patient placed on continuous monitoring, SpO2 98% with patient tolerating well.

## 2022-06-08 NOTE — Progress Notes (Signed)
Occupational Therapy Treatment Patient Details Name: CEBERT PERLICK MRN: [id number] DOB: [birth year] Today's Date: 06/08/2022   History of present illness Hristopher Hopman is a 42yoM who comes to [facility] on 04/26/21 reports his self care needs not being met after prior DC, staffing issues in home and reportedly not able to get his medications. Pt was DC from here 10 days prior after admission for respiratory distress, found to have active influenza infection, also CHF exacerbation. APS now involved in pt's case and he is in process of applying for medicare and obtaining facility placement. 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 At baseline, pt is lift transfers and total care +2, nonambulatory despite extensive time working with rehab in acute setting over several months with goals of maximizing independence in bed mobility and transfers.   OT comments  Mr Desisto was seen for OT/PT co-treatment on this date. Upon arrival to room pt seated EOB, agreeable to tx. Pt requires CGA + RW sit<>Stand at bed height 30" and tolerated 26 ft mobility + 22 ft and 1 SPT chair>bed. Greatly improved tolerance for mobility this date. Pt making good progress toward goals, will continue to follow POC. Discharge recommendation remains appropriate.     Recommendations for follow up therapy are one component of a multi-disciplinary discharge planning process, led by the attending physician.  Recommendations may be updated based on patient status, additional functional criteria and insurance authorization.    Follow Up Recommendations  Acute inpatient rehab (3hours/day)     Assistance Recommended at Discharge Frequent or constant Supervision/Assistance  Patient can return home with the following  Two people to help with walking and/or transfers;Two people to help with bathing/dressing/bathroom;Assistance with cooking/housework;Help with stairs or ramp for  entrance;Assist for transportation   Equipment Recommendations  Hospital bed    Recommendations for Other Services      Precautions / Restrictions Precautions Precautions: Fall Restrictions Weight Bearing Restrictions: No       Mobility Bed Mobility Overal bed mobility: Needs Assistance Bed Mobility: Supine to Sit     Supine to sit: Min assist     General bed mobility comments: assist for BLE    Transfers Overall transfer level: Needs assistance Equipment used: Rolling walker (2 wheels) Transfers: Sit to/from Stand Sit to Stand: Min guard, From elevated surface           General transfer comment: bed height 30", CGA standing from chair     Balance Overall balance assessment: Needs assistance Sitting-balance support: Feet supported Sitting balance-[name] Scale: Normal     Standing balance support: Bilateral upper extremity supported, Reliant on assistive device for balance Standing balance-[name] Scale: Good                             ADL either performed or assessed with clinical judgement   ADL Overall ADL's : Needs assistance/impaired                                       General ADL Comments: MAX A don B shoes in sitting. CGA x2 + RW simulated toilet t/f.      Cognition Arousal/Alertness: Awake/alert Behavior During Therapy: WFL for tasks assessed/performed Overall Cognitive Status: Within Functional Limits for tasks assessed  Exercises Other Exercises Other Exercises: ~26 ft mobility + 22 ft and 1 SPT chair>bed            Pertinent Vitals/ Pain       Pain Assessment Pain Assessment: No/denies pain   Frequency  Min 2X/week        Progress Toward Goals  OT Goals(current goals can now be found in the care plan section)  Progress towards OT goals: Progressing toward goals  Acute Rehab OT Goals Patient Stated Goal: to go home OT Goal  Formulation: With patient Time For Goal Achievement: 06/18/22 Potential to Achieve Goals: Fair ADL Goals Pt Will Perform Grooming: sitting;with modified independence Pt Will Perform Lower Body Dressing: with min assist;sitting/lateral leans;with adaptive equipment Pt Will Transfer to Toilet: bedside commode;stand pivot transfer;with min assist;with +2 assist Pt/caregiver will Perform Home Exercise Program: Increased strength;Both right and left upper extremity;With theraband;With written HEP provided;Independently  Plan Discharge plan remains appropriate;Frequency remains appropriate    Co-evaluation    PT/OT/SLP Co-Evaluation/Treatment: Yes Reason for Co-Treatment: For patient/therapist safety;To address functional/ADL transfers PT goals addressed during session: Mobility/safety with mobility OT goals addressed during session: ADL's and self-care      AM-PAC OT "6 Clicks" Daily Activity     Outcome Measure   Help from another person eating meals?: None Help from another person taking care of personal grooming?: A Little Help from another person toileting, which includes using toliet, bedpan, or urinal?: A Lot Help from another person bathing (including washing, rinsing, drying)?: A Lot Help from another person to put on and taking off regular upper body clothing?: A Little Help from another person to put on and taking off regular lower body clothing?: A Lot 6 Click Score: 16    End of Session Equipment Utilized During Treatment: Rolling walker (2 wheels)  OT Visit Diagnosis: Other abnormalities of gait and mobility (R26.89);Muscle weakness (generalized) (M62.81)   Activity Tolerance Patient tolerated treatment well   Patient Left with call bell/phone within reach;in chair   Nurse Communication          Time: [id number] OT Time Calculation (min): 29 min  Charges: OT General Charges $OT Visit: 1 Visit OT Treatments $Therapeutic Activity: 8-22 mins  [name],  M.S. OTR/L  06/08/22, 1:59 PM  ascom [phone]

## 2022-06-09 NOTE — ED Notes (Signed)
Suction container emptied, pt denies any needs at this time.

## 2022-06-09 NOTE — Progress Notes (Signed)
Mobility Specialist - Progress Note   06/09/22 1401  Mobility  Activity Stood at bedside;Ambulated with assistance in room;Dangled on edge of bed  Level of Assistance Standby assist, set-up cues, supervision of patient - no hands on  Assistive Device Front wheel walker  Distance Ambulated (ft) 5 ft  Activity Response Tolerated well  Mobility Referral Yes  $Mobility charge 1 Mobility   Pt sitting semi-supine on Trach collar. Pt completes bed mobility with HHA. Pt STS from bed height of 30 inches and stands for 60~75 seconds. Pt STS and ambulates from bed to recliner SBA with no LOB noted. Pt left in recliner with needs in reach.   Gretchen Short  Mobility Specialist  06/09/22 2:05 PM

## 2022-06-09 NOTE — ED Notes (Signed)
PT working with pt.

## 2022-06-09 NOTE — ED Notes (Signed)
Pt given a snack per his request.

## 2022-06-09 NOTE — ED Notes (Signed)
Pt asking for temp in room to be turned down.

## 2022-06-09 NOTE — ED Provider Notes (Signed)
-----------------------------------------   6:11 AM on 06/09/2022 -----------------------------------------   Blood pressure 110/70, pulse 82, temperature 97.9 F (36.6 C), temperature source Oral, resp. rate 18, height 6' 3"$  (1.905 m), weight (!) 177.4 kg, SpO2 94 %.  The patient is calm and cooperative at this time.  There have been no acute events since the last update.  Awaiting disposition plan from case management/social work.    Garrie Woodin, Delice Bison, DO 06/09/22 [phone]

## 2022-06-09 NOTE — ED Notes (Signed)
Pt called and asked to be placed on the bedpan.

## 2022-06-09 NOTE — ED Notes (Signed)
Pt wanting to get back into bed. Pt able to stand and pivot using walker. Light assistance from this RN required to help swing legs into bed. Pt provided with warm blankets and ginger ale.

## 2022-06-10 NOTE — ED Notes (Signed)
Physical therapy at bedside

## 2022-06-10 NOTE — ED Notes (Signed)
Pt placed on bedpan

## 2022-06-10 NOTE — Progress Notes (Signed)
Mobility Specialist - Progress Note   06/10/22 1427  Mobility  Activity Stood at bedside;Dangled on edge of bed;Ambulated with assistance in room  Level of Assistance Standby assist, set-up cues, supervision of patient - no hands on  Assistive Device Front wheel walker  Distance Ambulated (ft) 2 ft  Activity Response Tolerated well  Mobility Referral Yes  $Mobility charge 1 Mobility   Pt sitting in fowlers position on Trach collar. Pt completes bed mobility with HHA. Pt STS X2 from bed height of 31 inches and 32 inches SBA with no physical assistance required.  Pt able to maintain standing balance for 60 seconds the first time and 90 seconds the second time. Pt takes 3 lateral steps second standing trail SBA. Pt returns to bed with needs in reach.   Gretchen Short  Mobility Specialist  06/10/22 2:31 PM

## 2022-06-10 NOTE — ED Notes (Signed)
Patient is resting comfortably. 

## 2022-06-10 NOTE — ED Provider Notes (Signed)
-----------------------------------------   6:48 AM on 06/10/2022 -----------------------------------------   Blood pressure 113/79, pulse 88, temperature 98.4 F (36.9 C), temperature source Oral, resp. rate 18, height 1.905 m (6' 3"$ ), weight (!) 177.4 kg, SpO2 95 %.  The patient is calm and cooperative at this time.  There have been no acute events since the last update.  Awaiting disposition plan from [facility] team.   Hinda Kehr, MD 06/10/22 [phone]

## 2022-06-10 NOTE — ED Notes (Signed)
Pt removed from bed pan. Pericare provided, and pt repositioned in bed.

## 2022-06-10 NOTE — Progress Notes (Signed)
Assisted with patient transition from home vent to 5L 28% TC. Home vent on standby at patient bedside. Patient denies further trach care at this time.

## 2022-06-10 NOTE — ED Provider Notes (Signed)
-----------------------------------------   12:21 PM on 06/10/2022 -----------------------------------------   Blood pressure 113/79, pulse 88, temperature 98.4 F (36.9 C), temperature source Oral, resp. rate 18, height 6' 3"$  (1.905 m), weight (!) 177.4 kg, SpO2 95 %.  No complaints this morning  The patient is calm and cooperative at this time.  There have been no acute events since the last update.  Awaiting disposition plan from case management/social work.    Nathaniel Man, MD 06/10/22 1221

## 2022-06-11 NOTE — ED Notes (Signed)
Purewick changed per request, new blue pads placed under pt. Purewick cannister emptied. Pt able to reposition self in bed. New pillow case on pillow, bedding change offered, pt refused. Gown change offered, pt refused at this time. Call light is in reach, [facility].

## 2022-06-11 NOTE — ED Notes (Signed)
Patient assisted onto bedpan. Patient had BM. Patient cleaned. New pad placed on bed

## 2022-06-11 NOTE — ED Provider Notes (Signed)
-----------------------------------------   4:37 AM on 06/11/2022 -----------------------------------------   Blood pressure 116/73, pulse 80, temperature 98 F (36.7 C), resp. rate 17, height 6' 3"$  (1.905 m), weight (!) 177.4 kg, SpO2 94 %.  The patient is calm and cooperative at this time.  There have been no acute events since the last update.  Awaiting disposition plan from case management/social work.    Aeron Lheureux, Delice Bison, DO 06/11/22 [phone]

## 2022-06-11 NOTE — ED Notes (Signed)
Assumed care of pt. VSS and updated. Pt is requesting housekeeping to change trashcans emptied, EVS called.

## 2022-06-11 NOTE — Progress Notes (Signed)
Patient assisted with transition from home vent to TC 5L 28%. Trach care completed at this time, no further assistance needed at this time. Home vent on standby at bedside.

## 2022-06-11 NOTE — ED Notes (Signed)
Rounded on patient. Patient provided with food and drink at request. Patient denies other needs at this time.

## 2022-06-11 NOTE — Progress Notes (Signed)
Occupational Therapy Treatment Patient Details Name: Gary [name] MRN: [id number] DOB: 09/26/1955 Today's Date: 06/11/2022   History of present illness Gary [name] is a 75yoM who comes to [facility] on 04/26/21 reports his self care needs not being met after prior DC, staffing issues in home and reportedly not able to get his medications. Pt was DC from here 10 days prior after admission for respiratory distress, found to have active influenza infection, also CHF exacerbation. APS now involved in pt's case and he is in process of applying for medicare and obtaining facility placement. PMH: morbid obesity, CRF s/p tracheostomy c PMV when awake and triology vent support at when sleeping, HTN, COPD   OT comments  Mr Valone was seen for OT treatment on this date. Upon arrival to room pt reclined in bed, agreeable to tx. Pt requires CGA sit<>stand x2 from elevated bed 31"/32". Tolerated functional mobility x24 ft, x 10 ft. Completed SPT chair>bed with CGA. Pt continues to be limited by inability to stand from low bed/chair height. Pt making good progress toward goals, will continue to follow POC. Discharge recommendation remains appropriate.     Recommendations for follow up therapy are one component of a multi-disciplinary discharge planning process, led by the attending physician.  Recommendations may be updated based on patient status, additional functional criteria and insurance authorization.    Follow Up Recommendations  Acute inpatient rehab (3hours/day)     Assistance Recommended at Discharge Frequent or constant Supervision/Assistance  Patient can return home with the following  Two people to help with walking and/or transfers;Two people to help with bathing/dressing/bathroom;Assistance with cooking/housework;Help with stairs or ramp for entrance;Assist for transportation   Equipment Recommendations  Hospital bed    Recommendations for Other Services      Precautions / Restrictions  Precautions Precautions: Fall Restrictions Weight Bearing Restrictions: No       Mobility Bed Mobility Overal bed mobility: Needs Assistance Bed Mobility: Supine to Sit, Sit to Supine     Supine to sit: Min assist Sit to supine: Min assist        Transfers Overall transfer level: Needs assistance Equipment used: Rolling walker (2 wheels) Transfers: Sit to/from Stand, Bed to chair/wheelchair/BSC Sit to Stand: Min guard     Step pivot transfers: Min guard           Balance Overall balance assessment: Needs assistance Sitting-balance support: Feet supported Sitting balance-[name] Scale: Normal     Standing balance support: Bilateral upper extremity supported, Reliant on assistive device for balance Standing balance-[name] Scale: Fair                             ADL either performed or assessed with clinical judgement   ADL Overall ADL's : Needs assistance/impaired                                       General ADL Comments: MAX A don/doff B shoes seated EOB. CGA + RW simulated toilet t/f     Cognition Arousal/Alertness: Awake/alert Behavior During Therapy: WFL for tasks assessed/performed Overall Cognitive Status: Within Functional Limits for tasks assessed  Pertinent Vitals/ Pain       Pain Assessment Pain Assessment: No/denies pain   Frequency  Min 2X/week        Progress Toward Goals  OT Goals(current goals can now be found in the care plan section)  Progress towards OT goals: Progressing toward goals  Acute Rehab OT Goals Patient Stated Goal: to go home OT Goal Formulation: With patient Time For Goal Achievement: 06/18/22 Potential to Achieve Goals: Fair ADL Goals Pt Will Perform Grooming: sitting;with modified independence Pt Will Perform Lower Body Dressing: with min assist;sitting/lateral leans;with adaptive equipment Pt Will Transfer to Toilet:  bedside commode;stand pivot transfer;with min assist;with +2 assist Pt/caregiver will Perform Home Exercise Program: Increased strength;Both right and left upper extremity;With theraband;With written HEP provided;Independently  Plan Discharge plan remains appropriate;Frequency remains appropriate    Co-evaluation                 AM-PAC OT "6 Clicks" Daily Activity     Outcome Measure   Help from another person eating meals?: None Help from another person taking care of personal grooming?: A Little Help from another person toileting, which includes using toliet, bedpan, or urinal?: A Lot Help from another person bathing (including washing, rinsing, drying)?: A Lot Help from another person to put on and taking off regular upper body clothing?: A Little Help from another person to put on and taking off regular lower body clothing?: A Lot 6 Click Score: 16    End of Session Equipment Utilized During Treatment: Rolling walker (2 wheels)  OT Visit Diagnosis: Other abnormalities of gait and mobility (R26.89);Muscle weakness (generalized) (M62.81)   Activity Tolerance Patient tolerated treatment well   Patient Left with call bell/phone within reach;in bed   Nurse Communication Mobility status;Need for lift equipment        Time: [id number] OT Time Calculation (min): 50 min  Charges: OT General Charges $OT Visit: 1 Visit OT Treatments $Self Care/Home Management : 8-22 mins $Therapeutic Activity: 23-37 mins  [name], M.S. OTR/L  06/11/22, 3:55 PM  ascom [phone]

## 2022-06-12 MED ORDER — TORSEMIDE 20 MG PO TABS
20.0000 mg | ORAL_TABLET | ORAL | Status: AC
  Administered 2022-06-12: 20 mg via ORAL
  Filled 2022-06-12: qty 1

## 2022-06-12 NOTE — Progress Notes (Signed)
Physical Therapy Treatment Patient Details Name: Gary [name] MRN: [id number] DOB: 04-14-1956 Today's Date: 06/12/2022   History of Present Illness Gary [name] is a 12yoM who comes to [facility] on 04/26/21 reports his self care needs not being met after prior DC, staffing issues in home and reportedly not able to get his medications. Pt was DC from here 10 days prior after admission for respiratory distress, found to have active influenza infection, also CHF exacerbation. APS now involved in pt's case and he is in process of applying for medicare and obtaining facility placement. PMH: morbid obesity, CRF s/p tracheostomy c PMV when awake and triology vent support at when sleeping, HTN, COPD    PT Comments    Patient is agreeable to PT. He reports feeling L knee pain today with standing as well as feeling that he is "holding extra fluid today." Patient was able to stand x 2 bouts with elevated bed height with Min guard assistance. He declined attempting to walk in hallway today secondary to knee pain. With Min guard he was able to take several side steps along edge of bed with cues for technique using rolling walker. Recommend to continue PT to maximize independence and decrease caregiver burden.     Recommendations for follow up therapy are one component of a multi-disciplinary discharge planning process, led by the attending physician.  Recommendations may be updated based on patient status, additional functional criteria and insurance authorization.  Follow Up Recommendations  Acute inpatient rehab (3hours/day) Can patient physically be transported by private vehicle: No   Assistance Recommended at Discharge Intermittent Supervision/Assistance  Patient can return home with the following Two people to help with bathing/dressing/bathroom;Assistance with cooking/housework;Assist for transportation;Help with stairs or ramp for entrance;Two people to help with walking and/or transfers;Direct  supervision/assist for medications management;Direct supervision/assist for financial management   Equipment Recommendations  None recommended by PT    Recommendations for Other Services       Precautions / Restrictions Precautions Precautions: Fall Restrictions Weight Bearing Restrictions: No     Mobility  Bed Mobility Overal bed mobility: Needs Assistance Bed Mobility: Sit to Supine, Supine to Sit     Supine to sit: Min assist Sit to supine: Mod assist   General bed mobility comments: assistance for LLE and trunk support to sit upright. assistance for BLE to return to bed. verbal cues for technique    Transfers Overall transfer level: Needs assistance Equipment used: Rolling walker (2 wheels) Transfers: Sit to/from Stand Sit to Stand: Min guard           General transfer comment: 2 bouts of standing from elevated bed height ~30 inches. reinforcement of technique    Ambulation/Gait               General Gait Details: patient was able to take 3-4 small side steps to the right with rolling walker for support in standing. unable to ambulate further due to knee pain and feeling of "extra fluid" everywhere today   Stairs             Wheelchair Mobility    Modified Rankin (Stroke Patients Only)       Balance Overall balance assessment: Needs assistance Sitting-balance support: Feet supported Sitting balance-[name] Scale: Normal     Standing balance support: Bilateral upper extremity supported, Reliant on assistive device for balance Standing balance-[name] Scale: Fair  Cognition Arousal/Alertness: Awake/alert Behavior During Therapy: WFL for tasks assessed/performed Overall Cognitive Status: Within Functional Limits for tasks assessed                                          Exercises      General Comments        Pertinent Vitals/Pain Pain Assessment Pain Assessment:  Faces Faces Pain Scale: Hurts little more Pain Location: L knee Pain Descriptors / Indicators: Sore Pain Intervention(s): Limited activity within patient's tolerance, Monitored during session    Home Living                          Prior Function            PT Goals (current goals can now be found in the care plan section) Acute Rehab PT Goals Patient Stated Goal: to walk and go home to his apartment PT Goal Formulation: With patient Time For Goal Achievement: 06/21/22 Potential to Achieve Goals: Fair Progress towards PT goals: Progressing toward goals    Frequency    Min 2X/week      PT Plan Current plan remains appropriate    Co-evaluation              AM-PAC PT "6 Clicks" Mobility   Outcome Measure  Help needed turning from your back to your side while in a flat bed without using bedrails?: A Lot Help needed moving from lying on your back to sitting on the side of a flat bed without using bedrails?: A Lot Help needed moving to and from a bed to a chair (including a wheelchair)?: A Lot Help needed standing up from a chair using your arms (e.g., wheelchair or bedside chair)?: Total Help needed to walk in hospital room?: Total Help needed climbing 3-5 steps with a railing? : Total 6 Click Score: 9    End of Session Equipment Utilized During Treatment: Oxygen Activity Tolerance: Patient tolerated treatment well Patient left: in bed;with call bell/phone within reach   PT Visit Diagnosis: Unsteadiness on feet (R26.81);Muscle weakness (generalized) (M62.81);Difficulty in walking, not elsewhere classified (R26.2)     Time: [id number] PT Time Calculation (min) (ACUTE ONLY): 35 min  Charges:  $Therapeutic Activity: 23-37 mins                     [name], PT, MPT    [name] 06/12/2022, 2:05 PM

## 2022-06-12 NOTE — ED Notes (Signed)
Rounded on patient, denies needs at this time. Patient supine in bed with respirations even and non labored, patient without signs of distress.

## 2022-06-12 NOTE — ED Provider Notes (Signed)
-----------------------------------------   7:47 AM on 06/12/2022 -----------------------------------------   Blood pressure 104/67, pulse 84, temperature 98 F (36.7 C), temperature source Oral, resp. rate 20, height 1.905 m (6' 3"$ ), weight (!) 177.4 kg, SpO2 95 %.  The patient is calm and cooperative at this time.  There have been no acute events since the last update.  Awaiting disposition plan from [facility] team.   Hinda Kehr, MD 06/12/22 [phone]

## 2022-06-12 NOTE — ED Notes (Signed)
Rounded on patient, patient denies further needs at this time.

## 2022-06-12 NOTE — ED Notes (Signed)
Rounded on patient. Provided with drink at request. Denies further needs.

## 2022-06-12 NOTE — ED Notes (Signed)
Patient with large brown bowel movement per rectum. Pericare provided to patient. Patient denies further needs.

## 2022-06-13 MED ORDER — ACETAMINOPHEN 325 MG PO TABS
650.0000 mg | ORAL_TABLET | Freq: Four times a day (QID) | ORAL | Status: DC | PRN
  Administered 2022-06-13 – 2022-07-10 (×25): 650 mg via ORAL
  Filled 2022-06-13 (×26): qty 2

## 2022-06-13 NOTE — ED Notes (Signed)
Pt repositioned himself in the bedroom, requested to be put on bedpan, RN helped him onto bedpan, RN placed call light near so he could call her when he was finished

## 2022-06-13 NOTE — Progress Notes (Signed)
Nutrition Brief Note  RD consulted for assessment of nutritional requirements/ status.  Wt Readings from Last 15 Encounters:  05/04/22 (!) 177.4 kg  04/14/22 (!) 160.4 kg  04/06/22 (!) 193.2 kg  01/19/22 (!) 171.5 kg  01/03/22 (!) 171.5 kg  12/26/21 (!) 173.1 kg  12/12/21 (!) 183.7 kg  08/21/21 (!) 171.5 kg  08/15/21 (!) 181.8 kg  07/23/21 (!) 185.5 kg  07/05/21 (!) 180.1 kg  06/07/21 (!) 178.3 kg  05/11/21 (!) 181.4 kg  05/08/21 (!) 186.3 kg  04/11/21 (!) 181.8 kg   Pt with history of morbid obesity, GERD, hyperlipidemia, chronic respiratory failure status post tracheostomy, COPD, hypertension, who presents for chief concerns of shortness of breath.   Pt very familiar to this RD from multiple previous admissions.   Pt with trach, requiring nocturnal vent.   Per discussion with TOC, placement has been difficult for pt due to size. Pt has expressed interest in weight loss in the past, however, pt very selective about which recommendations he follows. This behavior has been consistent during other admissions as well. Pt has historically been non-compliant with RD recommendations.   Reviewed wt hx; pt has experienced a 2.2% wt loss over the past year, which is not significant for time frame.   Per TOC notes, pt may be eligible for inpatient rehab in [facility]. He is currently making good progress with therapy.   Body mass index is 48.87 kg/m. Patient meets criteria for obesity, class III based on current BMI. Obesity is a complex, chronic medical condition that is optimally managed by a multidisciplinary care team. Weight loss is not an ideal goal for an acute inpatient hospitalization. However, if further work-up for obesity is warranted, consider outpatient referral to [facility].    Current diet order is heart healthy/ carb modified, patient is consuming approximately 100% of meals at this time. Labs and medications reviewed.  Current nutrition therapy plan remains appropriate for pt.   No nutrition interventions warranted at this time. If nutrition issues arise, please consult RD.   Loistine Chance, RD, LDN, [place] Registered Dietitian II Certified Diabetes Care and Education Specialist Please refer to [facility] for RD and/or RD on-call/weekend/after hours pager

## 2022-06-13 NOTE — Progress Notes (Signed)
Occupational Therapy Treatment Patient Details Name: Gary [name] MRN: [id number] DOB: 1955-07-22 Today's Date: 06/13/2022   History of present illness Gary [name] is a 17yoM who comes to [facility] on 04/26/21 reports his self care needs not being met after prior DC, staffing issues in home and reportedly not able to get his medications. Pt was DC from here 10 days prior after admission for respiratory distress, found to have active influenza infection, also CHF exacerbation. APS now involved in pt's case and he is in process of applying for medicare and obtaining facility placement. PMH: morbid obesity, CRF s/p tracheostomy c PMV when awake and triology vent support at when sleeping, HTN, COPD   OT comments  Gary [name] was seen for OT treatment on this date. Upon arrival to room pt reclined in bed, agreeable to tx. Pt requires MIN A exit bed, endorses feeling fluid overloaded in BLE however eager to trial walking. CGA + RW standing from elevated bed, tolerates x30 ft + x15 ft mobility. Left in chair. Pt making good progress toward goals, will continue to follow POC. Discharge recommendation remains appropriate.     Recommendations for follow up therapy are one component of a multi-disciplinary discharge planning process, led by the attending physician.  Recommendations may be updated based on patient status, additional functional criteria and insurance authorization.    Follow Up Recommendations  Acute inpatient rehab (3hours/day)     Assistance Recommended at Discharge Frequent or constant Supervision/Assistance  Patient can return home with the following  Two people to help with walking and/or transfers;Two people to help with bathing/dressing/bathroom;Assistance with cooking/housework;Help with stairs or ramp for entrance;Assist for transportation   Equipment Recommendations  Hospital bed    Recommendations for Other Services      Precautions / Restrictions Precautions Precautions:  Fall Restrictions Weight Bearing Restrictions: No       Mobility Bed Mobility Overal bed mobility: Needs Assistance Bed Mobility: Supine to Sit     Supine to sit: Min assist          Transfers Overall transfer level: Needs assistance Equipment used: Rolling walker (2 wheels) Transfers: Sit to/from Stand, Bed to chair/wheelchair/BSC Sit to Stand: Min guard     Step pivot transfers: Min guard     General transfer comment: max elevated bed height     Balance Overall balance assessment: Needs assistance Sitting-balance support: Feet supported Sitting balance-[name] Scale: Normal     Standing balance support: Bilateral upper extremity supported, Reliant on assistive device for balance Standing balance-[name] Scale: Fair                             ADL either performed or assessed with clinical judgement   ADL Overall ADL's : Needs assistance/impaired                                       General ADL Comments: MAX A don/doff B shoes seated EOB. CGA + RW simulated toilet t/f      Cognition Arousal/Alertness: Awake/alert Behavior During Therapy: WFL for tasks assessed/performed Overall Cognitive Status: Within Functional Limits for tasks assessed                                          Exercises Other  Exercises Other Exercises: ~30 ft mobility + 15 ft            Pertinent Vitals/ Pain       Pain Assessment Pain Assessment: Faces Faces Pain Scale: Hurts a little bit Pain Location: L ankle Pain Descriptors / Indicators: Sore Pain Intervention(s): Limited activity within patient's tolerance, Repositioned   Frequency  Min 2X/week        Progress Toward Goals  OT Goals(current goals can now be found in the care plan section)  Progress towards OT goals: Progressing toward goals  Acute Rehab OT Goals Patient Stated Goal: to go home OT Goal Formulation: With patient Time For Goal Achievement:  06/18/22 Potential to Achieve Goals: Fair ADL Goals Pt Will Perform Grooming: sitting;with modified independence Pt Will Perform Lower Body Dressing: with min assist;sitting/lateral leans;with adaptive equipment Pt Will Transfer to Toilet: bedside commode;stand pivot transfer;with min assist;with +2 assist Pt/caregiver will Perform Home Exercise Program: Increased strength;Both right and left upper extremity;With theraband;With written HEP provided;Independently  Plan Discharge plan remains appropriate;Frequency remains appropriate    Co-evaluation                 AM-PAC OT "6 Clicks" Daily Activity     Outcome Measure   Help from another person eating meals?: None Help from another person taking care of personal grooming?: A Little Help from another person toileting, which includes using toliet, bedpan, or urinal?: A Lot Help from another person bathing (including washing, rinsing, drying)?: A Lot Help from another person to put on and taking off regular upper body clothing?: A Little Help from another person to put on and taking off regular lower body clothing?: A Lot 6 Click Score: 16    End of Session Equipment Utilized During Treatment: Rolling walker (2 wheels)  OT Visit Diagnosis: Other abnormalities of gait and mobility (R26.89);Muscle weakness (generalized) (M62.81)   Activity Tolerance Patient tolerated treatment well   Patient Left with call bell/phone within reach;in bed   Nurse Communication          Time: [id number] OT Time Calculation (min): 29 min  Charges: OT General Charges $OT Visit: 1 Visit OT Treatments $Self Care/Home Management : 8-22 mins $Therapeutic Activity: 8-22 mins  [name], M.S. OTR/L  06/13/22, 2:33 PM  ascom [phone]

## 2022-06-13 NOTE — ED Notes (Signed)
Pt complaining of left leg pain, and requesting tylenol. MD [place] notified.

## 2022-06-13 NOTE — ED Provider Notes (Signed)
-----------------------------------------   6:07 AM on 06/13/2022 -----------------------------------------   Blood pressure 107/78, pulse 82, temperature 98.1 F (36.7 C), temperature source Oral, resp. rate (!) 22, height 1.905 m (6' 3"$ ), weight (!) 177.4 kg, SpO2 94 %.  The patient is calm and cooperative at this time.  There have been no acute events since the last update.  Awaiting disposition plan from [facility] team.   Hinda Kehr, MD 06/13/22 [phone]

## 2022-06-14 NOTE — Progress Notes (Signed)
Physical Therapy Treatment Patient Details Name: Gary [name] MRN: [id number] DOB: Feb 14, 1956 Today's Date: 06/14/2022   History of Present Illness Gary [name] is a 88yoM who comes to [facility] on 04/26/21 reports his self care needs not being met after prior DC, staffing issues in home and reportedly not able to get his medications. Pt was DC from here 10 days prior after admission for respiratory distress, found to have active influenza infection, also CHF exacerbation. APS now involved in pt's case and he is in process of applying for medicare and obtaining facility placement. PMH: morbid obesity, CRF s/p tracheostomy c PMV when awake and triology vent support at when sleeping, HTN, COPD   PT Comments    Patient continues to make progress towards PT goals. He ambulated 71f, seated rest break, and then 484fusing rolling walker. Periods of reciprocal gait pattern with cues for taking standing rest breaks as needed as well as breathing techniques. Recommend to continue PT to maximize independence and decrease caregiver burden.    Recommendations for follow up therapy are one component of a multi-disciplinary discharge planning process, led by the attending physician.  Recommendations may be updated based on patient status, additional functional criteria and insurance authorization.  Follow Up Recommendations  Acute inpatient rehab (3hours/day) Can patient physically be transported by private vehicle: No   Assistance Recommended at Discharge Intermittent Supervision/Assistance  Patient can return home with the following Assist for transportation;Help with stairs or ramp for entrance;A lot of help with walking and/or transfers;A little help with bathing/dressing/bathroom;Assistance with cooking/housework   Equipment Recommendations  None recommended by PT    Recommendations for Other Services       Precautions / Restrictions Precautions Precautions: Fall Restrictions Weight Bearing  Restrictions: No     Mobility  Bed Mobility Overal bed mobility: Needs Assistance Bed Mobility: Supine to Sit     Supine to sit: Min assist     General bed mobility comments: assistance for LLE and trunk support to sit upright. assistance for BLE to return to bed. verbal cues for technique    Transfers Overall transfer level: Needs assistance Equipment used: Rolling walker (2 wheels) Transfers: Sit to/from Stand, Bed to chair/wheelchair/BSC Sit to Stand: Min guard, From elevated surface           General transfer comment: bed height at least 30 inches. reinforcement of technique including anterior weight shifting    Ambulation/Gait Ambulation/Gait assistance: Min guard Gait Distance (Feet):  ([id number] device: Rolling walker (2 wheels) Gait Pattern/deviations: Step-through pattern Gait velocity: decreased     General Gait Details: periods of step through pattern with cues for taking standing rest breaks as needed. chair follow for safety. education provided on relying less on UE support on rolling walker as patient reports arm fatigue before leg fatigue. one seated rest break between bouts of walking   Stairs             Wheelchair Mobility    Modified Rankin (Stroke Patients Only)       Balance Overall balance assessment: Needs assistance Sitting-balance support: Feet supported Sitting balance-[name] Scale: Normal     Standing balance support: Bilateral upper extremity supported Standing balance-[name] Scale: Fair                              Cognition Arousal/Alertness: Awake/alert Behavior During Therapy: WFL for tasks assessed/performed Overall Cognitive Status: Within Functional Limits for tasks assessed  Exercises      General Comments General comments (skin integrity, edema, etc.): patient continues to make progress with ambulation and walked further today  than he has during this hospital admission. cues for breathing techniques while ambulating      Pertinent Vitals/Pain Pain Assessment Pain Assessment: Faces Faces Pain Scale: Hurts a little bit Pain Location: bilateral feet Pain Descriptors / Indicators: Sore Pain Intervention(s): Limited activity within patient's tolerance    Home Living                          Prior Function            PT Goals (current goals can now be found in the care plan section) Acute Rehab PT Goals Patient Stated Goal: to walk and go home PT Goal Formulation: With patient Time For Goal Achievement: 06/21/22 Potential to Achieve Goals: Fair Progress towards PT goals: Progressing toward goals    Frequency    Min 2X/week      PT Plan Current plan remains appropriate    Co-evaluation              AM-PAC PT "6 Clicks" Mobility   Outcome Measure  Help needed turning from your back to your side while in a flat bed without using bedrails?: A Lot Help needed moving from lying on your back to sitting on the side of a flat bed without using bedrails?: A Lot Help needed moving to and from a bed to a chair (including a wheelchair)?: A Lot Help needed standing up from a chair using your arms (e.g., wheelchair or bedside chair)?: Total Help needed to walk in hospital room?: Total Help needed climbing 3-5 steps with a railing? : Total 6 Click Score: 9    End of Session Equipment Utilized During Treatment: Oxygen (trach collar 02 Fi02 28%, 3 L02) Activity Tolerance: Patient tolerated treatment well Patient left: in chair;with call bell/phone within reach Nurse Communication: Mobility status PT Visit Diagnosis: Unsteadiness on feet (R26.81);Muscle weakness (generalized) (M62.81);Difficulty in walking, not elsewhere classified (R26.2)     Time: [id number] PT Time Calculation (min) (ACUTE ONLY): 44 min  Charges:  $Gait Training: 8-22 mins $Therapeutic Activity: 23-37 mins                      [name], PT, MPT   [name] 06/14/2022, 2:49 PM

## 2022-06-14 NOTE — TOC Progression Note (Signed)
Transition of Care [facility]) - Progression Note    Patient Details  Name: Gary [name] MRN: [id number] Date of Birth: Oct 05, 1955  Transition of Care [facility] CM/SW Contact  Ross Ludwig, [place] Phone Number: 06/14/2022, 5:55 PM  Clinical Narrative:     Patient still does not have any bed offers due to his weight.  Patient has been participating better with therapy and ambulating.  This Probation officer to send updated clinicals to SNFs to review again and update patient's FL2.  TOC to continue to follow patient's progress throughout discharge planning.  Expected Discharge Plan: [place] Barriers to Discharge: ED Unsafe disposition  Expected Discharge Plan and Services   Discharge Planning Services: CM Consult Post Acute Care Choice: [place] Living arrangements for the past 2 months: Single Family Home                 DME Arranged: N/A DME Agency: NA                   Social Determinants of Health (SDOH) Interventions SDOH Screenings   Food Insecurity: No Food Insecurity (04/15/2022)  Housing: Low Risk  (04/15/2022)  Transportation Needs: Unmet Transportation Needs (04/15/2022)  Utilities: Not At Risk (04/15/2022)  Recent Concern: Utilities - At Risk (02/05/2022)  Depression (PHQ2-9): Low Risk  (06/07/2021)  Financial Resource Strain: Medium Risk (05/03/2017)  Physical Activity: Insufficiently Active (05/03/2017)  Social Connections: Moderately Integrated (05/03/2017)  Stress: No Stress Concern Present (05/03/2017)  Tobacco Use: Medium Risk (04/26/2022)    Readmission Risk Interventions    12/31/2021    8:58 AM 12/20/2021    4:21 PM 05/04/2021    1:26 PM  Readmission Risk Prevention Plan  Transportation Screening Complete Complete Complete  Medication Review Press photographer) Complete Complete Complete  PCP or Specialist appointment within 3-5 days of discharge Complete Complete Complete  HRI or Home Care Consult Complete Complete Complete  SW  Recovery Care/Counseling Consult Complete Complete Complete  Palliative Care Screening Complete Complete Not [place] Not Applicable Not Applicable Not Applicable

## 2022-06-15 NOTE — ED Notes (Signed)
Bed change while patient walking in hallway with PT/OT

## 2022-06-15 NOTE — Progress Notes (Signed)
Physical Therapy Treatment Patient Details Name: Gary [name] MRN: [id number] DOB: 1955-05-29 Today's Date: 06/15/2022   History of Present Illness Gary [name] is a 72yoM who comes to [facility] on 04/26/21 reports his self care needs not being met after prior DC, staffing issues in home and reportedly not able to get his medications. Pt was DC from here 10 days prior after admission for respiratory distress, found to have active influenza infection, also CHF exacerbation. PMH: morbid obesity, CRF s/p tracheostomy c PMV when awake and triology vent support at when sleeping, HTN, COPD    PT Comments    Patient was agreeable to PT. He continues to have increased activity tolerance and has increased his ambulation distance today. He walked 3 bouts of 57f, 350f and 5565fith seated rest breaks between bouts of walking. Multiple standing bouts performed, from bed and from bariatric chair from elevated height, from anywhere from [place] assistance required. He is motivated to return home to his apartment when he is more independent. PT will continue to follow to maximize independence and decrease caregiver burden.    Recommendations for follow up therapy are one component of a multi-disciplinary discharge planning process, led by the attending physician.  Recommendations may be updated based on patient status, additional functional criteria and insurance authorization.  Follow Up Recommendations  Acute inpatient rehab (3hours/day) Can patient physically be transported by private vehicle: No   Assistance Recommended at Discharge Intermittent Supervision/Assistance  Patient can return home with the following Assist for transportation;Help with stairs or ramp for entrance;A lot of help with walking and/or transfers;A little help with bathing/dressing/bathroom;Assistance with cooking/housework   Equipment Recommendations  None recommended by PT    Recommendations for Other Services        Precautions / Restrictions Precautions Precautions: Fall Restrictions Weight Bearing Restrictions: No     Mobility  Bed Mobility Overal bed mobility: Needs Assistance Bed Mobility: Supine to Sit     Supine to sit: HOB elevated, Min assist Sit to supine: Max assist   General bed mobility comments: assistance for trunk support with intermittent LLE support provided to sit upright. increased assistance required for return to bed for LE support. cues for technique    Transfers Overall transfer level: Needs assistance Equipment used: Rolling walker (2 wheels) Transfers: Sit to/from Stand Sit to Stand: Min guard, Min assist           General transfer comment: Min gaurd to Min A for standing from bed x 1 and from recliner chair x 2 bouts. reinforcement of anterior weight shifting. all surfaces are from elevated height of at least 29-30 inches    Ambulation/Gait Ambulation/Gait assistance: Min guard, Supervision Gait Distance (Feet):  ([id number] device: Rolling walker (2 wheels) Gait Pattern/deviations: Step-through pattern, Step-to pattern, Trunk flexed Gait velocity: decreased     General Gait Details: 3 bouts of walking performed with seated rest breaks between bouts of walking. cues for taking standing breaks as needed, reciprocal gait pattern. chair follow for safety   Stairs             Wheelchair Mobility    Modified Rankin (Stroke Patients Only)       Balance Overall balance assessment: Needs assistance Sitting-balance support: Feet supported Sitting balance-[name] Scale: Normal     Standing balance support: Bilateral upper extremity supported Standing balance-[name] Scale: Fair  Cognition Arousal/Alertness: Awake/alert Behavior During Therapy: WFL for tasks assessed/performed Overall Cognitive Status: Within Functional Limits for tasks assessed                                           Exercises      General Comments General comments (skin integrity, edema, etc.): patient continues to make progress with increased activity tolerance and increased gait distance today. will plan for mobility team to see over the weekend to maintain strength and independence with mobility      Pertinent Vitals/Pain Pain Assessment Pain Assessment: No/denies pain    Home Living                          Prior Function            PT Goals (current goals can now be found in the care plan section) Acute Rehab PT Goals Patient Stated Goal: to walk and go home PT Goal Formulation: With patient Time For Goal Achievement: 06/21/22 Potential to Achieve Goals: Fair Progress towards PT goals: Progressing toward goals    Frequency    Min 2X/week      PT Plan Current plan remains appropriate    Co-evaluation              AM-PAC PT "6 Clicks" Mobility   Outcome Measure  Help needed turning from your back to your side while in a flat bed without using bedrails?: A Lot Help needed moving from lying on your back to sitting on the side of a flat bed without using bedrails?: A Lot Help needed moving to and from a bed to a chair (including a wheelchair)?: A Lot Help needed standing up from a chair using your arms (e.g., wheelchair or bedside chair)?: Total Help needed to walk in hospital room?: Total Help needed climbing 3-5 steps with a railing? : Total 6 Click Score: 9    End of Session Equipment Utilized During Treatment: Oxygen (trach collar 02 Fi02 28%, 3 L02) Activity Tolerance: Patient tolerated treatment well Patient left: in bed;with call bell/phone within reach Nurse Communication: Mobility status PT Visit Diagnosis: Unsteadiness on feet (R26.81);Muscle weakness (generalized) (M62.81);Difficulty in walking, not elsewhere classified (R26.2)     Time: [id number] PT Time Calculation (min) (ACUTE ONLY): 44 min  Charges:  $Gait Training:  8-22 mins $Therapeutic Activity: 23-37 mins                     [name], PT, MPT    [name] 06/15/2022, 2:40 PM

## 2022-06-15 NOTE — ED Notes (Signed)
Pt given equipment for trach care upon request

## 2022-06-15 NOTE — ED Notes (Signed)
Pt helped on bed pan and cleaned up

## 2022-06-16 LAB — CBC
HCT: 37.6 % — ABNORMAL LOW (ref 39.0–52.0)
Hemoglobin: 10.1 g/dL — ABNORMAL LOW (ref 13.0–17.0)
MCH: 21.7 pg — ABNORMAL LOW (ref 26.0–34.0)
MCHC: 26.9 g/dL — ABNORMAL LOW (ref 30.0–36.0)
MCV: 80.7 fL (ref 80.0–100.0)
Platelets: 226 10*3/uL (ref 150–400)
RBC: 4.66 MIL/uL (ref 4.22–5.81)
RDW: 16.2 % — ABNORMAL HIGH (ref 11.5–15.5)
WBC: 5.5 10*3/uL (ref 4.0–10.5)
nRBC: 0 % (ref 0.0–0.2)

## 2022-06-16 MED ORDER — TORSEMIDE 20 MG PO TABS
20.0000 mg | ORAL_TABLET | ORAL | Status: DC | PRN
  Administered 2022-06-16 – 2022-07-08 (×4): 20 mg via ORAL
  Filled 2022-06-16 (×4): qty 1

## 2022-06-16 NOTE — ED Notes (Signed)
Lab called to come get CBC that was due 06/15/22

## 2022-06-16 NOTE — ED Notes (Signed)
Pt placed on bedpan. Lab showed up to draw pt. Will call lab to draw pt when pt is done.

## 2022-06-16 NOTE — Progress Notes (Signed)
Mobility Specialist - Progress Note   06/16/22 1426  Mobility  Activity Ambulated with assistance in hallway;Stood at bedside;Dangled on edge of bed  Level of Assistance Standby assist, set-up cues, supervision of patient - no hands on  Assistive Device Front wheel walker  Distance Ambulated (ft) 30 ft  Activity Response Tolerated well  Mobility Referral Yes  $Mobility charge 1 Mobility   Pt semi-supine on Trach upon arrival. Pt completes bed mobility with MinA. Pt STS and  ambulates 2 bouts of 64f and 5 ft with seated rest break in between bouts of ambulation SBA with no LOB noted. Pt returns to bed with needs in reach.   BGretchen Short Mobility Specialist  06/16/22 2:28 PM

## 2022-06-16 NOTE — ED Provider Notes (Signed)
Emergency Medicine Observation Re-evaluation Note  Gary [name] is a 67 y.o. male, seen on rounds today.  Pt initially presented to the ED for complaints of Abdominal Pain  Currently, the patient is calm, no acute complaints.  Physical Exam  Blood pressure 124/75, pulse 83, temperature 98 F (36.7 C), temperature source Oral, resp. rate 18, height '6\' 3"'$  (1.905 m), weight (!) 177.4 kg, SpO2 95 %. Physical Exam General: NAD Lungs: CTAB Psych: not agitated  ED Course / MDM  EKG:    I have reviewed the labs performed to date as well as medications administered while in observation.  Recent changes in the last 24 hours include no acute events overnight.  Pt requests additional dose of torsemide this afternoon due to persistent LE edema. No cp/sob/fever.  This is a frequent request for the patient.  Will place a standing order for an extra dose of torsemide every 3 day as needed.  Plan  Current plan is for social work placement, continue medication regimen and PT.[name]   Carrie Mew, MD 06/16/22 1550

## 2022-06-16 NOTE — ED Notes (Signed)
Therapy at bedside working with patient.

## 2022-06-16 NOTE — Progress Notes (Signed)
Placed pt on home Trilogy with O2 bleedin of 3 lpm. O2 sat 98%

## 2022-06-16 NOTE — ED Notes (Signed)
Pt given another blanket, boxed lunch, and sprite per pt request. Pt denies further needs at this time

## 2022-06-16 NOTE — Progress Notes (Signed)
Pt placed on home vent for overnight use

## 2022-06-16 NOTE — ED Notes (Signed)
Report given to Alex RN.

## 2022-06-16 NOTE — ED Notes (Signed)
Pt called out at this time c/o being wet. Suction container of urine empties, pt pad changes, peri care preformed, and new purewick placed at this time.

## 2022-06-16 NOTE — ED Notes (Signed)
Went in to give pt night medications and to do vital signs. Pt would not give RN name and date of birth. RN informed pt that we need to make sure we are giving the right medications to the right person. Pt states "you know who I am. I'm not giving you my name so you can throw them away." Pt also refused vital signs. RN informed pt that if he changes his mind use your call light. Pt in NAD at this time.

## 2022-06-17 MED ORDER — TORSEMIDE 20 MG PO TABS
20.0000 mg | ORAL_TABLET | Freq: Once | ORAL | Status: AC
  Administered 2022-06-17: 20 mg via ORAL
  Filled 2022-06-17: qty 1

## 2022-06-17 NOTE — Progress Notes (Signed)
Mobility Specialist - Progress Note   06/17/22 1500  Mobility  Activity Stood at bedside;Ambulated with assistance in room;Dangled on edge of bed  Level of Assistance Contact guard assist, steadying assist  Assistive Device Front wheel walker  Distance Ambulated (ft) 3 ft  Activity Response Tolerated well  Mobility Referral Yes  $Mobility charge 1 Mobility   Pt semi-supine on Trach upon arrival. Pt completes bed mobility MinA. Pt dons/doffs gloves indep. Pt STS and ambulates from bed to recliner SBA with no LOB noted. Pt left in recliner with needs in reach.   Gretchen Short  Mobility Specialist  06/17/22 3:02 PM

## 2022-06-17 NOTE — Progress Notes (Signed)
Mobility Specialist - Progress Note   06/17/22 1613  Mobility  Activity Transferred from chair to bed  Level of Assistance +2 (takes two people)  Assistive Device Other (Comment) Optician, dispensing)  Activity Response Tolerated well  Mobility Referral Yes  $Mobility charge 1 Mobility   Pt hoyer transfer from recliner to bed.  Gary [name]  Mobility Specialist  06/17/22 4:14 PM

## 2022-06-17 NOTE — ED Provider Notes (Signed)
-----------------------------------------   7:30 AM on 06/17/2022 -----------------------------------------   Blood pressure 124/75, pulse 83, temperature 98 F (36.7 C), temperature source Oral, resp. rate 18, height '6\' 3"'$  (1.905 m), weight (!) 177.4 kg, SpO2 98 %.  The patient is calm and cooperative at this time.  No complaints overnight.  There have been no acute events since the last update.  Awaiting disposition plan from case management/social work.    Nathaniel Man, MD 06/17/22 0730

## 2022-06-17 NOTE — Progress Notes (Signed)
Pt taken off home vent & placed back on 28% ATC

## 2022-06-17 NOTE — ED Notes (Signed)
New purewick applied and linen change performed

## 2022-06-18 NOTE — ED Provider Notes (Signed)
-----------------------------------------   4:55 AM on 06/18/2022 -----------------------------------------   Blood pressure 112/76, pulse 85, temperature 98.2 F (36.8 C), temperature source Oral, resp. rate 18, height '6\' 3"'$  (1.905 m), weight (!) 177.4 kg, SpO2 94 %.  The patient is calm and cooperative at this time.  There have been no acute events since the last update.  Awaiting disposition plan from case management/social work.    Martin Smeal, Delice Bison, DO 06/18/22 [phone]

## 2022-06-18 NOTE — Progress Notes (Signed)
OT Cancellation Note  Patient Details Name: Gary [name] MRN: [id number] DOB: Jun 03, 1955   Cancelled Treatment:    Reason Eval/Treat Not Completed: Pain limiting ability to participate. Upon arrival pt citing B knee pain, reports plans to complete bed level therex. Plan to re-attempt next date.   [name], M.S. OTR/L  06/18/22, 2:57 PM  ascom [phone]

## 2022-06-18 NOTE — ED Notes (Addendum)
Vet, RN cleaned up patient from [facility] and repositioned patient in the bed.

## 2022-06-19 NOTE — Progress Notes (Signed)
Physical Therapy Treatment Patient Details Name: Gary [name] MRN: [id number] DOB: 06/05/1955 Today's Date: 06/19/2022   History of Present Illness Gary [name] is a 87yoM who comes to [facility] on 04/26/21 reports his self care needs not being met after prior DC, staffing issues in home and reportedly not able to get his medications. Pt was DC from here 10 days prior after admission for respiratory distress, found to have active influenza infection, also CHF exacerbation. PMH: morbid obesity, CRF s/p tracheostomy c PMV when awake and triology vent support at when sleeping, HTN, COPD    PT Comments    Patient is agreeable to PT. He walked 2 bouts into the hallway using rolling walker with chair follow for safety. Cues for energy conservation techniques in order to maximize standing time and ambulation distance. The patient can stand from an elevated surface with no physical assistance, however the goal is to stand eventually from standard height surfaces. Recommend to continue PT to maximize independence and decrease caregiver burden.    Recommendations for follow up therapy are one component of a multi-disciplinary discharge planning process, led by the attending physician.  Recommendations may be updated based on patient status, additional functional criteria and insurance authorization.  Follow Up Recommendations  Acute inpatient rehab (3hours/day) Can patient physically be transported by private vehicle: No   Assistance Recommended at Discharge Intermittent Supervision/Assistance  Patient can return home with the following Assist for transportation;Help with stairs or ramp for entrance;A lot of help with walking and/or transfers;A little help with bathing/dressing/bathroom;Assistance with cooking/housework   Equipment Recommendations  None recommended by PT    Recommendations for Other Services       Precautions / Restrictions Precautions Precautions: Fall Restrictions Weight Bearing  Restrictions: No     Mobility  Bed Mobility Overal bed mobility: Needs Assistance Bed Mobility: Supine to Sit     Supine to sit: Min assist, HOB elevated     General bed mobility comments: assistance for LLE and for trunk support. cues for technique    Transfers Overall transfer level: Needs assistance Equipment used: Rolling walker (2 wheels) Transfers: Sit to/from Stand Sit to Stand: Min guard, From elevated surface           General transfer comment: bed height elevated to 30-31 inches. Min guard provided for safety    Ambulation/Gait Ambulation/Gait assistance: Min guard, Supervision, +2 safety/equipment Gait Distance (Feet):  (58f, 315f Assistive device: Rolling walker (2 wheels) Gait Pattern/deviations: Step-through pattern Gait velocity: decreased     General Gait Details: encouragement provided to take rest breaks as needed to increased standing tolerance/ambulation distance. one seated rest break between bouts of walking. activity tolerance limited by fatigue. chair follow for safety   Stairs             Wheelchair Mobility    Modified Rankin (Stroke Patients Only)       Balance Overall balance assessment: Needs assistance Sitting-balance support: Feet supported Sitting balance-[name] Scale: Normal     Standing balance support: Bilateral upper extremity supported Standing balance-[name] Scale: Fair Standing balance comment: standing tolerance of 1.5 minutes, and then standing during walking                            Cognition Arousal/Alertness: Awake/alert Behavior During Therapy: WFL for tasks assessed/performed Overall Cognitive Status: Within Functional Limits for tasks assessed  Exercises      General Comments        Pertinent Vitals/Pain Pain Assessment Pain Assessment: No/denies pain    Home Living                          Prior Function             PT Goals (current goals can now be found in the care plan section) Acute Rehab PT Goals Patient Stated Goal: to walk and go home PT Goal Formulation: With patient Time For Goal Achievement: 06/21/22 Potential to Achieve Goals: Fair Progress towards PT goals: Progressing toward goals    Frequency    Min 2X/week      PT Plan Current plan remains appropriate    Co-evaluation   Reason for Co-Treatment: For patient/therapist safety;To address functional/ADL transfers PT goals addressed during session: Mobility/safety with mobility OT goals addressed during session: ADL's and self-care      AM-PAC PT "6 Clicks" Mobility   Outcome Measure  Help needed turning from your back to your side while in a flat bed without using bedrails?: A Lot Help needed moving from lying on your back to sitting on the side of a flat bed without using bedrails?: A Lot Help needed moving to and from a bed to a chair (including a wheelchair)?: A Lot Help needed standing up from a chair using your arms (e.g., wheelchair or bedside chair)?: Total Help needed to walk in hospital room?: Total Help needed climbing 3-5 steps with a railing? : Total 6 Click Score: 9    End of Session Equipment Utilized During Treatment: Oxygen Activity Tolerance: Patient tolerated treatment well Patient left: in chair;with call bell/phone within reach   PT Visit Diagnosis: Unsteadiness on feet (R26.81);Muscle weakness (generalized) (M62.81);Difficulty in walking, not elsewhere classified (R26.2)     Time: [id number] PT Time Calculation (min) (ACUTE ONLY): 41 min  Charges:  $Therapeutic Activity: 23-37 mins                    [name], PT, MPT    [name] 06/19/2022, 2:31 PM

## 2022-06-19 NOTE — Progress Notes (Signed)
Occupational Therapy Re-Evaluation Patient Details Name: Gary [name] MRN: [id number] DOB: Apr 23, 1955 Today's Date: 06/19/2022   History of present illness [place] Cuvelier is a 19yoM who comes to [facility] on 04/26/21 reports his self care needs not being met after prior DC, staffing issues in home and reportedly not able to get his medications. Pt was DC from here 10 days prior after admission for respiratory distress, found to have active influenza infection, also CHF exacerbation. PMH: morbid obesity, CRF s/p tracheostomy c PMV when awake and triology vent support at when sleeping, HTN, COPD   OT comments  Mr Hsiung was seen for OT/PT co-treatment on this date. Upon arrival to room pt reclined in bed, agreeable to tx. Pt requires MAX A don/doff B shoes seated EOB. CGA + RW stand from bed x2 and chair x1, tolerates ~25 ft + 30 ft. MAX A pericare in standing. Goals re-assessed 2/2 prolonged length of stay and remain appropriate. Discharge recommendation remains appropriate.     Recommendations for follow up therapy are one component of a multi-disciplinary discharge planning process, led by the attending physician.  Recommendations may be updated based on patient status, additional functional criteria and insurance authorization.    Follow Up Recommendations  Acute inpatient rehab (3hours/day)     Assistance Recommended at Discharge Frequent or constant Supervision/Assistance  Patient can return home with the following  Two people to help with walking and/or transfers;Two people to help with bathing/dressing/bathroom;Assistance with cooking/housework;Help with stairs or ramp for entrance;Assist for transportation   Equipment Recommendations  Hospital bed    Recommendations for Other Services      Precautions / Restrictions Precautions Precautions: Fall Restrictions Weight Bearing Restrictions: No       Mobility Bed Mobility Overal bed mobility: Needs Assistance Bed Mobility: Supine to  Sit     Supine to sit: Min assist, HOB elevated          Transfers Overall transfer level: Needs assistance Equipment used: Rolling walker (2 wheels) Transfers: Sit to/from Stand Sit to Stand: Min guard, From elevated surface                 Balance Overall balance assessment: Needs assistance Sitting-balance support: Feet supported Sitting balance-[name] Scale: Normal     Standing balance support: Bilateral upper extremity supported Standing balance-[name] Scale: Fair                             ADL either performed or assessed with clinical judgement   ADL Overall ADL's : Needs assistance/impaired                                       General ADL Comments: MAX A don/doff B shoes seated EOB. CGA + RW simulated toilet t/f. MAX A pericare in standing    Extremity/Trunk Assessment Upper Extremity Assessment Upper Extremity Assessment: Generalized weakness   Lower Extremity Assessment Lower Extremity Assessment: Generalized weakness         Cognition Arousal/Alertness: Awake/alert Behavior During Therapy: WFL for tasks assessed/performed Overall Cognitive Status: Within Functional Limits for tasks assessed  Pertinent Vitals/ Pain       Pain Assessment Pain Assessment: No/denies pain   Frequency  Min 2X/week        Progress Toward Goals  OT Goals(current goals can now be found in the care plan section)  Progress towards OT goals: Progressing toward goals  Acute Rehab OT Goals Patient Stated Goal: to go home OT Goal Formulation: With patient Time For Goal Achievement: 07/17/22 Potential to Achieve Goals: Fair ADL Goals Pt Will Perform Grooming: sitting;with modified independence Pt Will Perform Lower Body Dressing: with min assist;sitting/lateral leans;with adaptive equipment Pt Will Transfer to Toilet: bedside commode;stand pivot transfer;with  min assist;with +2 assist Pt/caregiver will Perform Home Exercise Program: Increased strength;Both right and left upper extremity;With theraband;With written HEP provided;Independently  Plan Discharge plan remains appropriate;Frequency remains appropriate    Co-evaluation    PT/OT/SLP Co-Evaluation/Treatment: Yes Reason for Co-Treatment: For patient/therapist safety;To address functional/ADL transfers PT goals addressed during session: Mobility/safety with mobility OT goals addressed during session: ADL's and self-care      AM-PAC OT "6 Clicks" Daily Activity     Outcome Measure   Help from another person eating meals?: None Help from another person taking care of personal grooming?: A Little Help from another person toileting, which includes using toliet, bedpan, or urinal?: A Lot Help from another person bathing (including washing, rinsing, drying)?: A Lot Help from another person to put on and taking off regular upper body clothing?: A Little Help from another person to put on and taking off regular lower body clothing?: A Lot 6 Click Score: 16    End of Session Equipment Utilized During Treatment: Rolling walker (2 wheels)  OT Visit Diagnosis: Other abnormalities of gait and mobility (R26.89);Muscle weakness (generalized) (M62.81)   Activity Tolerance Patient tolerated treatment well   Patient Left with call bell/phone within reach;in chair   Nurse Communication          Time: [id number] OT Time Calculation (min): 42 min  Charges: OT General Charges $OT Visit: 1 Visit OT Evaluation $OT Re-eval: 1 Re-eval OT Treatments $Self Care/Home Management : 8-22 mins  [name], M.S. OTR/L  06/19/22, 2:34 PM  ascom [phone]

## 2022-06-19 NOTE — ED Provider Notes (Signed)
-----------------------------------------   6:05 AM on 06/19/2022 -----------------------------------------   Blood pressure 105/68, pulse 84, temperature 98.3 F (36.8 C), temperature source Oral, resp. rate 18, height '6\' 3"'$  (1.905 m), weight (!) 177.4 kg, SpO2 93 %.  The patient is calm and cooperative at this time.  There have been no acute events since the last update.  Awaiting disposition plan from Social Work team.   Paulette Blanch, MD 06/19/22 [phone]

## 2022-06-19 NOTE — ED Notes (Signed)
Pt provided cranberry juice and crackers per request

## 2022-06-19 NOTE — Progress Notes (Signed)
Transitioned patient from home vent to TC 5L 28%. Home vent on standby at bedside. Patient declined further trach care at this time.

## 2022-06-20 NOTE — TOC Progression Note (Signed)
Transition of Care [facility]) - Progression Note    Patient Details  Name: Gary [name] MRN: [id number] Date of Birth: 1956-01-15  Transition of Care [facility]) CM/SW Contact  Ross Ludwig, Celeste Phone Number: 06/20/2022, 11:14 AM  Clinical Narrative:     CSW has updated the [facility] and resent out to SNFs for placement.  Patient is participating with therapy, TOC awaiting for bed offers.   Expected Discharge Plan: [place] Barriers to Discharge: ED Unsafe disposition  Expected Discharge Plan and Services   Discharge Planning Services: CM Consult Post Acute Care Choice: [place] Living arrangements for the past 2 months: Single Family Home                 DME Arranged: N/A DME Agency: NA                   Social Determinants of Health (SDOH) Interventions SDOH Screenings   Food Insecurity: No Food Insecurity (04/15/2022)  Housing: Low Risk  (04/15/2022)  Transportation Needs: Unmet Transportation Needs (04/15/2022)  Utilities: Not At Risk (04/15/2022)  Recent Concern: Utilities - At Risk (02/05/2022)  Depression (PHQ2-9): Low Risk  (06/07/2021)  Financial Resource Strain: Medium Risk (05/03/2017)  Physical Activity: Insufficiently Active (05/03/2017)  Social Connections: Moderately Integrated (05/03/2017)  Stress: No Stress Concern Present (05/03/2017)  Tobacco Use: Medium Risk (04/26/2022)    Readmission Risk Interventions    12/31/2021    8:58 AM 12/20/2021    4:21 PM 05/04/2021    1:26 PM  Readmission Risk Prevention Plan  Transportation Screening Complete Complete Complete  Medication Review Press photographer) Complete Complete Complete  PCP or Specialist appointment within 3-5 days of discharge Complete Complete Complete  HRI or Home Care Consult Complete Complete Complete  SW Recovery Care/Counseling Consult Complete Complete Complete  Palliative Care Screening Complete Complete Not [place] Not Applicable  Not Applicable Not Applicable

## 2022-06-20 NOTE — Progress Notes (Addendum)
PT Cancellation Note  Patient Details Name: Gary [name] MRN: [id number] DOB: [birth year]   Cancelled Treatment:     PT attempt. Pt asleep upon entry. Easily awakes however refused PT at this time. " I want to wait to do it after lunch. I need to have a BM and do a few things before Ill be ready" Pryor Curia will try to return later this date. Acute PT will continue to follow per current POC.    [name] 06/20/2022, 11:35 AM

## 2022-06-20 NOTE — Progress Notes (Signed)
PT Cancellation Note  Patient Details Name: Gary [name] MRN: [id number] DOB: 1955/07/15   Cancelled Treatment:     PT attempt. Pt refused. Did work with OT earlier but states" I used up all my strength. I'll be ready tomorrow. I want to walk." Acute PT will continue to follow and progress as able per current POC.    [name] 06/20/2022, 4:14 PM

## 2022-06-20 NOTE — ED Notes (Signed)
Pt complaining of level 3/10 left sided chest pain. This RN collected EKG. While collecting EKG, pt reports pain went away. EKG signed by Dr. Leonides Schanz

## 2022-06-20 NOTE — ED Notes (Signed)
Pt called out to have BM, placed on bedpan.

## 2022-06-20 NOTE — NC FL2 (Signed)
[place] LEVEL OF CARE FORM     IDENTIFICATION  Patient Name: Gary [name] Birthdate: December 12, 1955 Sex: male Admission Date (Current Location): 04/26/2022  Rafael Gonzalez and [place] Number:  Kathleen Argue [id number] Facility and Address:  [facility], [address] [place], [place] [address]      Provider Number: [id number]  Attending Physician Name and Address:  No att. providers found  Relative Name and Phone Number:  [name] - sister- [phone]    Current Level of Care: Hospital Recommended Level of Care: [place] Prior Approval Number:    Date Approved/Denied:   PASRR Number: [id number]  Discharge Plan: SNF    Current Diagnoses: Patient Active Problem List   Diagnosis Date Noted   Acute exacerbation of CHF (congestive heart failure) ([place]) 04/11/2022   Influenza A 04/11/2022   Hypokalemia 03/06/2022   Acute on chronic diastolic (congestive) heart failure ([place]) 02/03/2022   Chronic obstructive pulmonary disease (COPD) ([place]) 02/03/2022   GERD without esophagitis 02/03/2022   Anxiety and depression 02/03/2022   Dyslipidemia 02/03/2022   Bloody stool 11/12/2021   Major depressive disorder, recurrent episode, moderate ([place]) 10/22/2021   Pressure injury of skin 09/27/2021   Acute on chronic respiratory failure with hypercapnia ([place]) 08/07/2021   COPD (chronic obstructive pulmonary disease) ([place])    HLD (hyperlipidemia)    Iron deficiency anemia    Chronic kidney disease, stage 3a ([place]) 07/22/2021   Acute on chronic respiratory failure with hypoxia and hypercapnia ([place]) 07/20/2021   Chronic obstructive pulmonary disease ([place])    Acute CHF (congestive heart failure) ([place]) 04/04/2021   Weakness    Acute respiratory failure with hypoxia and hypercapnia ([place]) 03/11/2021   (HFpEF) heart failure with preserved ejection fraction ([name])    Chronic respiratory failure with hypoxia ([name])    Hyperlipidemia 02/19/2021    Acute respiratory distress syndrome (ARDS) due to COVID-19 virus ([place])    Acute on chronic respiratory failure ([place]) 02/18/2021   Generalized osteoarthritis of multiple sites 10/31/2020   AKI (acute kidney injury) ([place]) 03/04/2020   Hyperkalemia 03/04/2020   COPD with acute exacerbation ([place]) 03/02/2020   Marijuana use 01/17/2020   Thrombocytopenia ([place]) 01/17/2020   Positive hepatitis C antibody test 01/17/2020   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 mass index (BMI) of 50.0 to 59.9 in adult [facility]) 01/17/2020   Osteoarthritis of glenohumeral joints (Bilateral) 01/17/2020   Osteoarthritis of AC (acromioclavicular) joints (Bilateral) 01/17/2020   Polysubstance abuse ([place]) [id number]   Toxic metabolic encephalopathy [id number]   Chronic pain syndrome 07/14/2019   DDD (degenerative disc disease), lumbosacral 07/14/2019   DDD (degenerative disc disease), cervical 07/14/2019   CHF (congestive heart failure) ([place]) 04/12/2017   Acute on chronic diastolic CHF (congestive heart failure) ([place]) 02/28/2015   Obstructive sleep apnea 02/28/2015   Acute on chronic diastolic congestive heart failure ([name])    Acute on chronic respiratory failure with hypoxia ([place])    Morbid obesity with BMI of 50.0-59.9, adult ([place]) 04/01/2014   HTN (hypertension) 04/01/2014    Orientation RESPIRATION BLADDER Height & Weight     Self, Time, Situation, Place  Tracheostomy, O2 (Trach Collar at 28% and oxygen 5L) Incontinent Weight: (!) 391 lb (177.4 kg) Height:  '6\' 3"'$  (190.5 cm)  BEHAVIORAL SYMPTOMS/MOOD NEUROLOGICAL BOWEL NUTRITION STATUS      Continent Diet (Heart Healthy/Carb Modified)  AMBULATORY STATUS COMMUNICATION OF NEEDS Skin   Limited Assist Verbally Normal  Personal Care Assistance Level of Assistance  Bathing, Feeding, Dressing Bathing Assistance: Limited assistance Feeding assistance: Independent Dressing Assistance: Limited assistance Total Care  Assistance: Limited assistance   Functional Limitations Info  Sight, Hearing, Speech Sight Info: Adequate Hearing Info: Adequate Speech Info: Adequate    SPECIAL CARE FACTORS FREQUENCY  PT (By licensed PT), OT (By licensed OT)     PT Frequency: Minimum 5x a week OT Frequency: Minimum 5x a week            Contractures Contractures Info: Not present    Additional Factors Info  Code Status, Allergies, Psychotropic Code Status Info: Full Code Allergies Info: No Known Allergies Psychotropic Info: escitalopram (LEXAPRO) tablet 5 mg         Current Medications (06/20/2022):  This is the current hospital active medication list Current Facility-Administered Medications  Medication Dose Route Frequency Provider Last Rate Last Admin   acetaminophen (TYLENOL) tablet 650 mg  650 mg Oral Q6H PRN Arta Silence, MD   650 mg at 06/20/22 0953   apixaban (ELIQUIS) tablet 2.5 mg  2.5 mg Oral BID Noralee Space, RPH   2.5 mg at 06/20/22 [id number]   escitalopram (LEXAPRO) tablet 5 mg  5 mg Oral Daily Blake Divine, MD   5 mg at 06/20/22 [id number]   ferrous sulfate tablet 325 mg  325 mg Oral Q breakfast Blake Divine, MD   325 mg at 06/20/22 0755   guaiFENesin (MUCINEX) 12 hr tablet 600 mg  600 mg Oral BID PRN Blake Divine, MD   600 mg at 06/19/22 2117   hydrOXYzine (ATARAX) tablet 25 mg  25 mg Oral TID PRN Blake Divine, MD   25 mg at 06/14/22 2150   ipratropium-albuterol (DUONEB) 0.5-2.5 (3) MG/3ML nebulizer solution 3 mL  3 mL Inhalation Q6H PRN Blake Divine, MD       melatonin tablet 5 mg  5 mg Oral QHS Blake Divine, MD   5 mg at 06/19/22 2117   pantoprazole (PROTONIX) EC tablet 40 mg  40 mg Oral BID Doylene Bode, MD   40 mg at 06/20/22 0755   torsemide (DEMADEX) tablet 20 mg  20 mg Oral Daily Blake Divine, MD   20 mg at 06/20/22 0953   torsemide (DEMADEX) tablet 20 mg  20 mg Oral Q72H PRN Carrie Mew, MD   20 mg at 06/16/22 1608   Current Outpatient Medications   Medication Sig Dispense Refill   apixaban (ELIQUIS) 2.5 MG TABS tablet Take 1 tablet (2.5 mg total) by mouth 2 (two) times daily. 60 tablet 0   bisacodyl (DULCOLAX) 10 MG suppository Place 1 suppository (10 mg total) rectally daily as needed for moderate constipation. 12 suppository 0   escitalopram (LEXAPRO) 5 MG tablet Take 1 tablet (5 mg total) by mouth daily. 30 tablet 0   ferrous sulfate 325 (65 FE) MG tablet Take 1 tablet (325 mg total) by mouth daily with breakfast. Home med 60 tablet 1   hydrOXYzine (ATARAX) 25 MG tablet Take 1 tablet (25 mg total) by mouth 3 (three) times daily as needed for anxiety. 30 tablet 0   ipratropium-albuterol (DUONEB) 0.5-2.5 (3) MG/3ML SOLN Take 3 mLs by nebulization every 6 (six) hours as needed.     melatonin 5 MG TABS Take 1 tablet (5 mg total) by mouth at bedtime. 30 tablet 0   pantoprazole (PROTONIX) 40 MG tablet Take 1 tablet (40 mg total) by mouth 2 (two) times daily before a meal. 60 tablet 0  polyethylene glycol (MIRALAX / GLYCOLAX) 17 g packet Take 17 g by mouth daily as needed for moderate constipation. 14 each 0   torsemide (DEMADEX) 20 MG tablet Take 1 tablet (20 mg total) by mouth daily. 30 tablet 2   COMBIVENT RESPIMAT 20-100 MCG/ACT AERS respimat Inhale 2 puffs into the lungs in the morning and at bedtime. (Patient not taking: Reported on 01/24/2022)     simvastatin (ZOCOR) 10 MG tablet Take 1 tablet (10 mg total) by mouth daily at 6 PM. (Patient not taking: Reported on 01/24/2022) 30 tablet 1     Discharge Medications: Please see discharge summary for a list of discharge medications.  Relevant Imaging Results:  Relevant Lab Results:   Additional Information SSN [id number]  Ross Ludwig, LCSW

## 2022-06-20 NOTE — ED Notes (Signed)
Pt had small BM, was cleaned and repositioned in bed. Pt does endorse some constipation and not being able to push due to pressure in trach. Pt denies any other needs at this time.

## 2022-06-20 NOTE — Progress Notes (Signed)
Occupational Therapy Treatment Patient Details Name: Gary [name] MRN: [id number] DOB: [birth year] Today's Date: 06/20/2022   History of present illness Gary [name] is a 89yoM who comes to [facility] on 04[facility]26[facility]21 reports his self care needs not being met after prior DC, staffing issues in home and reportedly not able to get his medications. Pt was DC from here 10 days prior after admission for respiratory distress, found to have active influenza infection, also CHF exacerbation. PMH: morbid obesity, CRF s/p tracheostomy c PMV when awake and triology vent support at when sleeping, HTN, COPD   OT comments  Gary [name] was seen for OT treatment on this date. Upon arrival to room pt reclined in bed, agreeable to tx. Pt requires MIN A exit bed. MOD I don/doff gown seated EOB. Attempted to step up onto scale for standing weight, pt unable. Tolerated x2 sit<>stand from max elevated bed height and side steps along EOB with CGA + RW. Pt making good progress toward goals, will continue to follow POC. Discharge recommendation remains appropriate.     Recommendations for follow up therapy are one component of a multi-disciplinary discharge planning process, led by the attending physician.  Recommendations may be updated based on patient status, additional functional criteria and insurance authorization.    Follow Up Recommendations  Acute inpatient rehab (3hours/day)     Assistance Recommended at Discharge Frequent or constant Supervision/Assistance  Patient can return home with the following  Two people to help with walking and/or transfers;Two people to help with bathing/dressing/bathroom;Assistance with cooking/housework;Help with stairs or ramp for entrance;Assist for transportation   Equipment Recommendations  Hospital bed    Recommendations for Other Services      Precautions / Restrictions Precautions Precautions: Fall Restrictions Weight Bearing Restrictions: No       Mobility Bed  Mobility Overal bed mobility: Needs Assistance Bed Mobility: Supine to Sit     Supine to sit: Min assist, HOB elevated Sit to supine: Mod assist        Transfers Overall transfer level: Needs assistance Equipment used: Rolling walker (2 wheels) Transfers: Sit to/from Stand Sit to Stand: Min guard, From elevated surface                 Balance Overall balance assessment: Needs assistance Sitting-balance support: Feet supported Sitting balance-[name] Scale: Normal     Standing balance support: Bilateral upper extremity supported Standing balance-[name] Scale: Fair                             ADL either performed or assessed with clinical judgement   ADL Overall ADL's : Needs assistance/impaired                                       General ADL Comments: MOD I don/doff gown seated EOB. MAX A don B shoes seated. CGA simulated BSC t/f      Cognition Arousal/Alertness: Awake/alert Behavior During Therapy: WFL for tasks assessed/performed Overall Cognitive Status: Within Functional Limits for tasks assessed                                                     Pertinent Vitals/ Pain       Pain  Assessment Pain Assessment: No/denies pain   Frequency  Min 2X/week        Progress Toward Goals  OT Goals(current goals can now be found in the care plan section)  Progress towards OT goals: Progressing toward goals  Acute Rehab OT Goals Patient Stated Goal: to go home OT Goal Formulation: With patient Time For Goal Achievement: 07/17/22 Potential to Achieve Goals: Fair ADL Goals Pt Will Perform Grooming: sitting;with modified independence Pt Will Perform Lower Body Dressing: with min assist;sitting/lateral leans;with adaptive equipment Pt Will Transfer to Toilet: bedside commode;stand pivot transfer;with min assist;with +2 assist Pt/caregiver will Perform Home Exercise Program: Increased strength;Both right and  left upper extremity;With theraband;With written HEP provided;Independently  Plan Discharge plan remains appropriate;Frequency remains appropriate    Co-evaluation                 AM-PAC OT "6 Clicks" Daily Activity     Outcome Measure   Help from another person eating meals?: None Help from another person taking care of personal grooming?: A Little Help from another person toileting, which includes using toliet, bedpan, or urinal?: A Lot Help from another person bathing (including washing, rinsing, drying)?: A Lot Help from another person to put on and taking off regular upper body clothing?: A Little Help from another person to put on and taking off regular lower body clothing?: A Lot 6 Click Score: 16    End of Session Equipment Utilized During Treatment: Rolling walker (2 wheels)  OT Visit Diagnosis: Other abnormalities of gait and mobility (R26.89);Muscle weakness (generalized) (M62.81)   Activity Tolerance Patient tolerated treatment well   Patient Left with call bell/phone within reach;in chair   Nurse Communication Mobility status;Need for lift equipment        Time: [id number] OT Time Calculation (min): 31 min  Charges: OT General Charges $OT Visit: 1 Visit OT Treatments $Self Care/Home Management : 23-37 mins  [name], M.S. OTR/L  06/20/22, 2:41 PM  ascom [phone]

## 2022-06-20 NOTE — ED Provider Notes (Signed)
-----------------------------------------   6:32 AM on 06/20/2022 -----------------------------------------   Blood pressure 117/75, pulse 81, temperature 98 F (36.7 C), temperature source Oral, resp. rate 18, height '6\' 3"'$  (1.905 m), weight (!) 177.4 kg, SpO2 96 %.  The patient is calm and cooperative at this time.  There have been no acute events since the last update.  Awaiting disposition plan from Social Work team.   Paulette Blanch, MD 06/20/22 [phone]

## 2022-06-21 NOTE — Evaluation (Addendum)
Physical Therapy Re-Evaluation Patient Details Name: Gary [name] MRN: [id number] DOB: 30-Jul-1955 Today's Date: 06/21/2022  History of Present Illness  Gary [name] is a 32yoM who comes to [facility] on 04/26/21 reports his self care needs not being met after prior DC, staffing issues in home and reportedly not able to get his medications. Pt was DC from here 10 days prior after admission for respiratory distress, found to have active influenza infection, also CHF exacerbation. PMH: morbid obesity, CRF s/p tracheostomy c PMV when awake and triology vent support at when sleeping, HTN, COPD   Clinical Impression  Patient seen for PT re-evaluation following prolonged hospital stay. Patient has made good progress with increased functional activity tolerance and independence with activity. He has consistently participated with therapy. He is now ambulating up to 46f with rolling walker with chair follow for safety, and often walks multiple bouts with seated rest break. Endurance is impaired for further distance ambulation at this time. The patient also requires elevated surfaces to stand due to leg weakness as well as height. He is unable to stand from a standard height surface which would make discharging home difficult at this time. Recommend to continue PT to maximize independence and to decrease caregiver burden.       Recommendations for follow up therapy are one component of a multi-disciplinary discharge planning process, led by the attending physician.  Recommendations may be updated based on patient status, additional functional criteria and insurance authorization.  Follow Up Recommendations Acute inpatient rehab (3hours/day) Can patient physically be transported by private vehicle: No    Assistance Recommended at Discharge Intermittent Supervision/Assistance  Patient can return home with the following  Assist for transportation;Help with stairs or ramp for entrance;A lot of help with walking  and/or transfers;A little help with bathing/dressing/bathroom;Assistance with cooking/housework    Equipment Recommendations None recommended by PT  Recommendations for Other Services       Functional Status Assessment Patient has not had a recent decline in their functional status     Precautions / Restrictions Precautions Precautions: Fall Restrictions Weight Bearing Restrictions: No      Mobility  Bed Mobility Overal bed mobility: Needs Assistance Bed Mobility: Supine to Sit     Supine to sit: Min assist, HOB elevated     General bed mobility comments: assistance for trunk and LLE support to sit upright. verbal cues for technique    Transfers Overall transfer level: Needs assistance Equipment used: Rolling walker (2 wheels) Transfers: Sit to/from Stand Sit to Stand: Min guard, From elevated surface           General transfer comment: patient can stand from elevated bed height of around 30 inches with Min gaurd assistance. multiple standing bouts performed from bed and bariatric recliner    Ambulation/Gait Ambulation/Gait assistance: Min guard (+2 for chair follow for safety) Gait Distance (Feet):  (557fand 5629fAssistive device: Rolling walker (2 wheels) Gait Pattern/deviations: Step-through pattern Gait velocity: decreased     General Gait Details: encouraged reciprocal gait pattern, taking standing rest breaks as needed. patient had one standing rest break during each walking bout and one seated rest break after 44f5falking an additional 56ft32fer resting. dyspnic with exertion. trach mask oxygen used, Fi02 28%, 3 L02.  Stairs            Wheelchair Mobility    Modified Rankin (Stroke Patients Only)       Balance Overall balance assessment: Needs assistance Sitting-balance support: Feet supported Sitting balance-[name]  Scale: Normal     Standing balance support: Bilateral upper extremity supported Standing balance-[name] Scale: Fair                                Pertinent Vitals/Pain Pain Assessment Pain Assessment: No/denies pain    Home Living Family/patient expects to be discharged to:: Private residence Living Arrangements: Other relatives Available Help at Discharge: Family;Personal care attendant Type of Home: Apartment Home Access: Level entry;Ramped entrance       Home Layout: One level        Prior Function Prior Level of Function : Needs assist             Mobility Comments: standing with assistance. unable to ambulate       Hand Dominance        Extremity/Trunk Assessment   Upper Extremity Assessment Upper Extremity Assessment: Generalized weakness    Lower Extremity Assessment Lower Extremity Assessment: Generalized weakness       Communication   Communication: Tracheostomy;Passy-Muir valve  Cognition Arousal/Alertness: Awake/alert Behavior During Therapy: WFL for tasks assessed/performed Overall Cognitive Status: Within Functional Limits for tasks assessed                                          General Comments  goals of care updated accordingly based on progress      Exercises     Assessment/Plan    PT Assessment Patient needs continued PT services  PT Problem List Decreased strength;Decreased mobility;Decreased activity tolerance;Cardiopulmonary status limiting activity;Decreased balance;Decreased skin integrity;Obesity       PT Treatment Interventions Functional mobility training;Neuromuscular re-education;Balance training;Therapeutic exercise;Therapeutic activities;Patient/family education;Wheelchair mobility training;DME instruction    PT Goals (Current goals can be found in the Care Plan section)  Acute Rehab PT Goals Patient Stated Goal: to walk and go home PT Goal Formulation: With patient Time For Goal Achievement: 07/05/22 Potential to Achieve Goals: Fair    Frequency Min 2X/week     Co-evaluation                AM-PAC PT "6 Clicks" Mobility  Outcome Measure Help needed turning from your back to your side while in a flat bed without using bedrails?: A Lot Help needed moving from lying on your back to sitting on the side of a flat bed without using bedrails?: A Lot Help needed moving to and from a bed to a chair (including a wheelchair)?: A Lot Help needed standing up from a chair using your arms (e.g., wheelchair or bedside chair)?: Total Help needed to walk in hospital room?: Total Help needed climbing 3-5 steps with a railing? : Total 6 Click Score: 9    End of Session Equipment Utilized During Treatment: Oxygen Activity Tolerance: Patient tolerated treatment well Patient left: in chair;with call bell/phone within reach   PT Visit Diagnosis: Unsteadiness on feet (R26.81);Muscle weakness (generalized) (M62.81);Difficulty in walking, not elsewhere classified (R26.2)    Time: 1321-1400 PT Time Calculation (min) (ACUTE ONLY): 39 min   Charges:   PT Evaluation $PT Re-evaluation: 1 Re-eval PT Treatments $Therapeutic Activity: 8-22 mins       [name], PT, MPT  [name] 06/21/2022, 3:05 PM

## 2022-06-21 NOTE — ED Notes (Signed)
Pt demanding pulse ox be taken off. Pt educated on importance of keeping it on. Pt still wants pulse ox off

## 2022-06-21 NOTE — ED Notes (Signed)
Pt helped from chair to bed with minimal assistance needed.

## 2022-06-21 NOTE — ED Provider Notes (Signed)
-----------------------------------------   3:48 AM on 06/21/2022 -----------------------------------------   Blood pressure 108/70, pulse 85, temperature 98.2 F (36.8 C), temperature source Oral, resp. rate (P) 18, height '6\' 3"'$  (1.905 m), weight (!) 177.4 kg, SpO2 96 %.  The patient is calm and cooperative at this time.  There have been no acute events since the last update.  Awaiting disposition plan from case management/social work.    Kentrel Clevenger, Delice Bison, DO 06/21/22 [phone]

## 2022-06-22 NOTE — ED Notes (Signed)
PT at bedside.

## 2022-06-22 NOTE — Progress Notes (Signed)
Occupational Therapy Treatment Patient Details Name: Gary [name] MRN: [id number] DOB: [birth year] Today's Date: 06/22/2022   History of present illness Gary [name] is a 62yoM who comes to [facility] on 04/26/21 reports his self care needs not being met after prior DC, staffing issues in home and reportedly not able to get his medications. Pt was DC from here 10 days prior after admission for respiratory distress, found to have active influenza infection, also CHF exacerbation. PMH: morbid obesity, CRF s/p tracheostomy c PMV when awake and triology vent support at when sleeping, HTN, COPD   OT comments  Gary [name] was seen for OT treatment on this date. Upon arrival to room pt reclined in bed, agreeable to tx. Pt requires MIN A exit bed. CGA + RW sit<>stand from elevated bed and recliner heights. Tolerated x55 ft, x60 ft functional mobility. Pt making good progress toward goals, will continue to follow POC. Discharge recommendation remains appropriate.     Recommendations for follow up therapy are one component of a multi-disciplinary discharge planning process, led by the attending physician.  Recommendations may be updated based on patient status, additional functional criteria and insurance authorization.    Follow Up Recommendations  Acute inpatient rehab (3hours/day)     Assistance Recommended at Discharge Frequent or constant Supervision/Assistance  Patient can return home with the following  Two people to help with walking and/or transfers;Two people to help with bathing/dressing/bathroom;Assistance with cooking/housework;Help with stairs or ramp for entrance;Assist for transportation   Equipment Recommendations  Hospital bed    Recommendations for Other Services      Precautions / Restrictions Precautions Precautions: Fall Restrictions Weight Bearing Restrictions: No       Mobility Bed Mobility Overal bed mobility: Needs Assistance Bed Mobility: Supine to Sit     Supine to  sit: Min assist          Transfers Overall transfer level: Needs assistance Equipment used: Rolling walker (2 wheels) Transfers: Sit to/from Stand Sit to Stand: Min guard, From elevated surface           General transfer comment: patient can stand from elevated bed height of around 30 inches with Min gaurd assistance. multiple standing bouts performed from bed and bariatric recliner     Balance Overall balance assessment: Needs assistance Sitting-balance support: Feet supported Sitting balance-[name] Scale: Normal     Standing balance support: Bilateral upper extremity supported Standing balance-[name] Scale: Fair                             ADL either performed or assessed with clinical judgement   ADL Overall ADL's : Needs assistance/impaired                                       General ADL Comments: MAX A don B shoes seated. CGA simulated toilet t/f      Cognition Arousal/Alertness: Awake/alert Behavior During Therapy: WFL for tasks assessed/performed Overall Cognitive Status: Within Functional Limits for tasks assessed                                          Pertinent Vitals/ Pain       Pain Assessment Pain Assessment: No/denies pain   Frequency  Min 2X/week  Progress Toward Goals  OT Goals(current goals can now be found in the care plan section)  Progress towards OT goals: Progressing toward goals  Acute Rehab OT Goals Patient Stated Goal: to go home OT Goal Formulation: With patient Time For Goal Achievement: 07/17/22 Potential to Achieve Goals: Fair ADL Goals Pt Will Perform Grooming: sitting;with modified independence Pt Will Perform Lower Body Dressing: with min assist;sitting/lateral leans;with adaptive equipment Pt Will Transfer to Toilet: bedside commode;stand pivot transfer;with min assist;with +2 assist Pt/caregiver will Perform Home Exercise Program: Increased strength;Both right  and left upper extremity;With theraband;With written HEP provided;Independently  Plan Discharge plan remains appropriate;Frequency remains appropriate    Co-evaluation                 AM-PAC OT "6 Clicks" Daily Activity     Outcome Measure   Help from another person eating meals?: None Help from another person taking care of personal grooming?: A Little Help from another person toileting, which includes using toliet, bedpan, or urinal?: A Lot Help from another person bathing (including washing, rinsing, drying)?: A Lot Help from another person to put on and taking off regular upper body clothing?: A Little Help from another person to put on and taking off regular lower body clothing?: A Lot 6 Click Score: 16    End of Session Equipment Utilized During Treatment: Rolling walker (2 wheels)  OT Visit Diagnosis: Other abnormalities of gait and mobility (R26.89);Muscle weakness (generalized) (M62.81)   Activity Tolerance Patient tolerated treatment well   Patient Left with call bell/phone within reach;in chair   Nurse Communication Mobility status;Need for lift equipment        Time: [id number] OT Time Calculation (min): 32 min  Charges: OT General Charges $OT Visit: 1 Visit OT Treatments $Self Care/Home Management : 8-22 mins $Therapeutic Activity: 8-22 mins  [name], M.S. OTR/L  06/22/22, 2:46 PM  ascom [phone]

## 2022-06-22 NOTE — ED Provider Notes (Signed)
-----------------------------------------   6:48 AM on 06/22/2022 -----------------------------------------   Blood pressure 131/75, pulse 100, temperature 98.2 F (36.8 C), resp. rate 18, height 1.905 m (6\' 3" ), weight (!) 177.4 kg, SpO2 96 %.  The patient is calm and cooperative at this time.  There have been no acute events since the last update.  Awaiting disposition plan from [facility] team.   Hinda Kehr, MD 06/22/22 [phone]

## 2022-06-22 NOTE — Progress Notes (Signed)
PT Cancellation Note  Patient Details Name: RC SCHIRALDI MRN: [id number] DOB: Apr 20, 1955   Cancelled Treatment:     PT attempt. 2nd attempt today. First attempt ~ 930 am pt was eating breakfast and requested author return at 11 am. 2nd attempt, pt asleep. He did easily awake but was unwilling to participate at this time. "I'll get up with OT this afternoon." Encouraged pt to increase daily activity to promote strengthening. Discussed need to really focus on improving strength/activity tolerance while improving his abilities to stand form lower surface heights. He states understanding but remained unwilling to participate in even bed level activity. Acute PT will continue to follow and progress as able per current POC. Overall pt is progressing but still severely decondition.    [name] 06/22/2022, 11:18 AM

## 2022-06-22 NOTE — ED Notes (Signed)
Pt had a large BM and was cleaned up with new chucks placed.

## 2022-06-23 NOTE — ED Notes (Signed)
Pt refused protonix due to RN asking for his name and date of birth.

## 2022-06-23 NOTE — ED Notes (Signed)
Pt up on the bedpan

## 2022-06-23 NOTE — ED Notes (Signed)
Pt taken off the bed pan. Pt pulled himself up in bed. States that his sister told him that he needs to take his medications. RN contacted pharmacy to get another dose due to him initially refusing initial dose.

## 2022-06-23 NOTE — Progress Notes (Signed)
Patient transitioned back to 28% trach collar after using home vent while sleeping. O2 increased to 4 liters during the night due to desaturation. Problem due to high leak detection as patient has uncuffed trach. O2 saturation with increase noted 91-93% No distress noted during my rounding. Per patient, he continues to administer his trach care needs as needed. Will continue to monitor.

## 2022-06-23 NOTE — ED Notes (Signed)
Pt working with PT  

## 2022-06-23 NOTE — ED Notes (Signed)
Pt taken off of bedpan. Minimal success. Replaced chux and pt pulled himself up in bed. Lunch tray within reach. Pt denies any further needs.

## 2022-06-23 NOTE — Progress Notes (Signed)
Placed patient on home vent with o2 3 liters in line. Patient in no distress. Continues self trach care. Patient states in need of new speaking valve and trach change. Updated patient that previous therapist has been in contact with speech for resolution and will speak with day shift staff to resolve trach change issue when it is due.

## 2022-06-23 NOTE — ED Notes (Signed)
This RN noted that pt's sats were decreasing. Assessed the pt who appears to be sleeping and is on the vent. This RN called respiratory and informed them. They said to increase the oxygen and then they would be down to check on the pt. This RN increased it from 2L to 5L.

## 2022-06-23 NOTE — ED Notes (Signed)
Pt asking for pillow under left leg removed and a cup of orange juice.

## 2022-06-23 NOTE — ED Notes (Signed)
Pt repositioned per his request. 

## 2022-06-23 NOTE — Progress Notes (Signed)
Mobility Specialist - Progress Note   06/23/22 1530  Mobility  Activity Ambulated with assistance in hallway;Stood at bedside;Dangled on edge of bed  Level of Assistance Standby assist, set-up cues, supervision of patient - no hands on  Assistive Device Front wheel walker  Distance Ambulated (ft) 110 ft  Activity Response Tolerated well  Mobility Referral Yes  $Mobility charge 1 Mobility   Pt supine in bed on Trach. Pt needs max assist to don shoes. Pt completes bed mobility with HHA. Pt STS from a bed height of 30 inches and ambulates 3 bouts ( 60, 50, 10). Pt returns to bed with needs in reach.  Gretchen Short  Mobility Specialist  06/23/22 3:32 PM

## 2022-06-23 NOTE — ED Notes (Signed)
Pt placed on bedpan for BM.

## 2022-06-23 NOTE — ED Notes (Signed)
Pt urine container emptied, pt has no other needs at this time.

## 2022-06-23 NOTE — ED Notes (Signed)
Report given to Dorian RN 

## 2022-06-24 NOTE — ED Notes (Signed)
Answered pts call bell. Repositioned pts L leg per request. Denies any other needs/wants at this time.

## 2022-06-24 NOTE — Progress Notes (Signed)
Patient back on trach collar. No distress noted while on home vent during the night with o2. Patient changed trach dressing and inner cannula when taken off home vent.

## 2022-06-24 NOTE — ED Provider Notes (Signed)
Emergency Medicine Observation Re-evaluation Note  Gary [name] is a 67 y.o. male, seen on rounds today.  Pt initially presented to the ED for complaints of Abdominal Pain  Currently, the patient is asleep  Physical Exam  Blood pressure 107/75, pulse 78, temperature 97.9 F (36.6 C), temperature source Oral, resp. rate 17, height '6\' 3"'$  (1.905 m), weight (!) 177.4 kg, SpO2 95 %. Plan   Current plan is to continue to wait for [facility] team.    Vanessa [place], MD 06/24/22 [phone]

## 2022-06-24 NOTE — ED Notes (Signed)
Went in room to give AM meds, pt continues to sleep.

## 2022-06-25 LAB — CBC
HCT: 37.1 % — ABNORMAL LOW (ref 39.0–52.0)
Hemoglobin: 10 g/dL — ABNORMAL LOW (ref 13.0–17.0)
MCH: 21.5 pg — ABNORMAL LOW (ref 26.0–34.0)
MCHC: 27 g/dL — ABNORMAL LOW (ref 30.0–36.0)
MCV: 79.6 fL — ABNORMAL LOW (ref 80.0–100.0)
Platelets: 212 10*3/uL (ref 150–400)
RBC: 4.66 MIL/uL (ref 4.22–5.81)
RDW: 16.2 % — ABNORMAL HIGH (ref 11.5–15.5)
WBC: 7.8 10*3/uL (ref 4.0–10.5)
nRBC: 0 % (ref 0.0–0.2)

## 2022-06-25 NOTE — TOC Progression Note (Signed)
Transition of Care [facility]) - Progression Note    Patient Details  Name: Gary [name] MRN: [id number] Date of Birth: 27-Jun-1955  [facility] CM/SW Contact  Ross Ludwig, [place] Phone Number: 06/25/2022, 5:17 PM  Clinical Narrative:     CSW still has no placement for patient.  Patient has been participating with PT who have now changed their recommendation to CIR.  Waiting for CIR to evaluate patient.  Expected Discharge Plan: [place] Barriers to Discharge: ED Unsafe disposition  Expected Discharge Plan and Services   Discharge Planning Services: CM Consult Post Acute Care Choice: [place] Living arrangements for the past 2 months: Single Family Home                 DME Arranged: N/A DME Agency: NA                   Social Determinants of Health (SDOH) Interventions SDOH Screenings   Food Insecurity: No Food Insecurity (04/15/2022)  Housing: Low Risk  (04/15/2022)  Transportation Needs: Unmet Transportation Needs (04/15/2022)  Utilities: Not At Risk (04/15/2022)  Recent Concern: Utilities - At Risk (02/05/2022)  Depression (PHQ2-9): Low Risk  (06/07/2021)  Financial Resource Strain: Medium Risk (05/03/2017)  Physical Activity: Insufficiently Active (05/03/2017)  Social Connections: Moderately Integrated (05/03/2017)  Stress: No Stress Concern Present (05/03/2017)  Tobacco Use: Medium Risk (04/26/2022)    Readmission Risk Interventions    12/31/2021    8:58 AM 12/20/2021    4:21 PM 05/04/2021    1:26 PM  Readmission Risk Prevention Plan  Transportation Screening Complete Complete Complete  Medication Review Press photographer) Complete Complete Complete  PCP or Specialist appointment within 3-5 days of discharge Complete Complete Complete  HRI or Home Care Consult Complete Complete Complete  SW Recovery Care/Counseling Consult Complete Complete Complete  Palliative Care Screening Complete Complete Not [place] Not Applicable Not Applicable Not Applicable

## 2022-06-25 NOTE — ED Notes (Signed)
Went in to pt room to introduce self, give AM meds, and obtain AM labs. Pt sleeping soundly. Will return once pt awakens.

## 2022-06-25 NOTE — ED Notes (Signed)
Perineal care done and absorbant pads changed.

## 2022-06-25 NOTE — ED Notes (Signed)
Pt requested that lab come and draw his CBC. Lab was called, spoke with Jinny Blossom, and she said they would come, but it would be "awhile" before they could come.

## 2022-06-25 NOTE — ED Notes (Signed)
Pt sleeping at this time.

## 2022-06-25 NOTE — ED Notes (Signed)
Pt eating dinner tray °

## 2022-06-25 NOTE — ED Notes (Signed)
In room with pt. Pt denies any needs or requests at this time.

## 2022-06-25 NOTE — ED Notes (Signed)
Pt given meal tray and AM meds. Pt had BM. Pericare performed and new absorbant pads placed underneath patient.

## 2022-06-25 NOTE — Progress Notes (Signed)
Physical Therapy Treatment Patient Details Name: Gary [name] MRN: [id number] DOB: 09/25/1955 Today's Date: 06/25/2022   History of Present Illness Nand Pontiff is a 89yoM who comes to [facility] on 04/26/21 reports his self care needs not being met after prior DC, staffing issues in home and reportedly not able to get his medications. Pt was DC from here 10 days prior after admission for respiratory distress, found to have active influenza infection, also CHF exacerbation. PMH: morbid obesity, CRF s/p tracheostomy c PMV when awake and triology vent support at when sleeping, HTN, COPD    PT Comments    Pt seen for PT tx with pt received asleep but easily awakened. Pt very tired today, reporting "I just can't wake up". Pt declined gait attempts but reports he will perform exercises. Pt performs BLE heel slides (decreased RLE clearance & unable to maintain neutral alignment, vs splaying out) 10x. Pt then reports he will hold off until tomorrow. Notified nurse of pt not feeling well.    Recommendations for follow up therapy are one component of a multi-disciplinary discharge planning process, led by the attending physician.  Recommendations may be updated based on patient status, additional functional criteria and insurance authorization.  Follow Up Recommendations  Acute inpatient rehab (3hours/day) Can patient physically be transported by private vehicle: No   Assistance Recommended at Discharge Intermittent Supervision/Assistance  Patient can return home with the following Assist for transportation;Help with stairs or ramp for entrance;A lot of help with walking and/or transfers;A little help with bathing/dressing/bathroom;Assistance with cooking/housework   Equipment Recommendations  None recommended by PT    Recommendations for Other Services       Precautions / Restrictions Precautions Precautions: Fall Precaution Comments: cuffed trach/ trach collar Restrictions Weight Bearing  Restrictions: No     Mobility  Bed Mobility                    Transfers                        Ambulation/Gait                   Stairs             Wheelchair Mobility    Modified Rankin (Stroke Patients Only)       Balance                                            Cognition Arousal/Alertness: Awake/alert Behavior During Therapy: WFL for tasks assessed/performed Overall Cognitive Status: Within Functional Limits for tasks assessed                                          Exercises General Exercises - Lower Extremity Heel Slides: AROM, Strengthening, 10 reps, Both, Supine    General Comments        Pertinent Vitals/Pain Pain Assessment Pain Assessment: No/denies pain    Home Living                          Prior Function            PT Goals (current goals can now be found in the care plan section) Acute Rehab PT  Goals Patient Stated Goal: to walk and go home PT Goal Formulation: With patient Time For Goal Achievement: 07/05/22 Potential to Achieve Goals: Fair Progress towards PT goals: Progressing toward goals    Frequency    Min 2X/week      PT Plan Current plan remains appropriate    Co-evaluation              AM-PAC PT "6 Clicks" Mobility   Outcome Measure  Help needed turning from your back to your side while in a flat bed without using bedrails?: A Lot Help needed moving from lying on your back to sitting on the side of a flat bed without using bedrails?: A Lot Help needed moving to and from a bed to a chair (including a wheelchair)?: A Lot Help needed standing up from a chair using your arms (e.g., wheelchair or bedside chair)?: Total Help needed to walk in hospital room?: Total Help needed climbing 3-5 steps with a railing? : Total 6 Click Score: 9    End of Session Equipment Utilized During Treatment: Oxygen Activity Tolerance: Patient  tolerated treatment well;Patient limited by fatigue Patient left: in chair;with call bell/phone within reach Nurse Communication: Mobility status PT Visit Diagnosis: Unsteadiness on feet (R26.81);Muscle weakness (generalized) (M62.81);Difficulty in walking, not elsewhere classified (R26.2)     Time: [id number] PT Time Calculation (min) (ACUTE ONLY): 10 min  Charges:  $Therapeutic Activity: 8-22 mins                     Lavone Nian, PT, DPT 06/25/22, 2:00 PM   [name] 06/25/2022, 1:58 PM

## 2022-06-25 NOTE — ED Notes (Signed)
Pt awakened, denies complaints. Pt suctioning trach himself at this time.

## 2022-06-26 NOTE — ED Notes (Signed)
Purwick drainage container chaged out. Patient's bedding check. Patient clean and dry. Patient denies other needs at this time.

## 2022-06-26 NOTE — Progress Notes (Signed)
SLP Note  Patient Details Name: Gary [name] MRN: [id number] DOB: 02-16-1956   Note:       Reason Eval/Treat Not Completed: Other (comment) (education on equipment/PMV provided to [place])  This SLP/ST services was contacted to provide pt a PMV d/t his PMV being "dirty", "clogged", and possibly "not working right". Pt has a chronic trach and uses a PMV for verbal communication at baseline.  Pt has been residing in the ED at [facility] 61 days as of this note. It appears he has been using the same PMV as he stated he did not have another w/ him and was unsure if one was at home w/ "other things". Pt was currently wearing a PMV during this conversation w/out distress noted.  A PMV was provided to NSG along w/ instructions of washing/care of the PMV -- washing w/ warm, soapy water must be done q24 hours. It was suggested to NSG that this washing be done nightly when the PMV is removed for pt to sleep. No chemical cleaners to be used. A care manual is provided in the packet along w/ the PMV. These items were left w/ the NSG who will f/u w/ the pt today. NSG agreed.  When pt discharges from the hospital, he will need to f/u w/ a medical supplies company to obtain another PMV, as he will do for his trach supplies.      Orinda Kenner, MS, CCC-SLP Speech Language Pathologist Rehab Services; [place] [phone] (ascom) Batya Citron 06/26/2022, 5:09 PM

## 2022-06-26 NOTE — Progress Notes (Signed)
RT changed pt's trach 7.0 xlt proximal cuffless without incident.Hr 82,rr 18,sat 95,no resp. Distress noted. Pt. Tolerated procedure well.

## 2022-06-26 NOTE — Progress Notes (Signed)
Physical Therapy Treatment Patient Details Name: Gary [name] MRN: [id number] DOB: [birth year] Today's Date: 06/26/2022   History of Present Illness Gary [name] is a 66yoM who comes to [facility] on 04/26/21 reports his self care needs not being met after prior DC, staffing issues in home and reportedly not able to get his medications. Pt was DC from here 10 days prior after admission for respiratory distress, found to have active influenza infection, also CHF exacerbation. PMH: morbid obesity, CRF s/p tracheostomy c PMV when awake and triology vent support at when sleeping, HTN, COPD    PT Comments    Patient was able to stand x 2 bouts with rest break between standing bouts. He was unable/unwilling to ambulate away from the bed due to reported left foot edema/discomfort. He did take several side steps with rolling walker with supervision. He wants to walk in the hallway tomorrow if able. PT will continue to follow to maximize independence and decrease caregiver burden.    Recommendations for follow up therapy are one component of a multi-disciplinary discharge planning process, led by the attending physician.  Recommendations may be updated based on patient status, additional functional criteria and insurance authorization.  Follow Up Recommendations  Acute inpatient rehab (3hours/day) Can patient physically be transported by private vehicle: No   Assistance Recommended at Discharge Intermittent Supervision/Assistance  Patient can return home with the following Assist for transportation;Help with stairs or ramp for entrance;A lot of help with walking and/or transfers;A little help with bathing/dressing/bathroom;Assistance with cooking/housework   Equipment Recommendations  None recommended by PT    Recommendations for Other Services       Precautions / Restrictions Precautions Precautions: Fall Restrictions Weight Bearing Restrictions: No     Mobility  Bed Mobility Overal bed  mobility: Needs Assistance Bed Mobility: Supine to Sit, Sit to Supine     Supine to sit: Min assist Sit to supine: Mod assist   General bed mobility comments: assistance for LLE and trunk support to sit upright. assistance for BLE support to return to bed. verbal cues for technique.    Transfers Overall transfer level: Needs assistance Equipment used: Rolling walker (2 wheels) Transfers: Sit to/from Stand Sit to Stand: Min guard, From elevated surface           General transfer comment: bed height at least 30 inches. 2 bouts of standing performed with cues for technique and anterior weight shifting    Ambulation/Gait               General Gait Details: patient took 3 small side steps to the right with rolling walker and supervision for safety. further ambulation limited due to reported left foot edema/discomfort with standing   Stairs             Wheelchair Mobility    Modified Rankin (Stroke Patients Only)       Balance Overall balance assessment: Needs assistance Sitting-balance support: Feet supported Sitting balance-[name] Scale: Normal     Standing balance support: Bilateral upper extremity supported Standing balance-[name] Scale: Fair                              Cognition Arousal/Alertness: Awake/alert Behavior During Therapy: WFL for tasks assessed/performed Overall Cognitive Status: Within Functional Limits for tasks assessed  Exercises      General Comments        Pertinent Vitals/Pain Pain Assessment Pain Assessment: No/denies pain    Home Living                          Prior Function            PT Goals (current goals can now be found in the care plan section) Acute Rehab PT Goals Patient Stated Goal: to walk and go home PT Goal Formulation: With patient Time For Goal Achievement: 07/05/22 Potential to Achieve Goals: Fair Progress towards  PT goals: Progressing toward goals    Frequency    Min 2X/week      PT Plan Current plan remains appropriate    Co-evaluation              AM-PAC PT "6 Clicks" Mobility   Outcome Measure  Help needed turning from your back to your side while in a flat bed without using bedrails?: A Lot Help needed moving from lying on your back to sitting on the side of a flat bed without using bedrails?: A Lot Help needed moving to and from a bed to a chair (including a wheelchair)?: A Lot Help needed standing up from a chair using your arms (e.g., wheelchair or bedside chair)?: Total Help needed to walk in hospital room?: Total Help needed climbing 3-5 steps with a railing? : Total 6 Click Score: 9    End of Session Equipment Utilized During Treatment: Oxygen (trach collar 5 L02) Activity Tolerance: Patient tolerated treatment well Patient left: in bed;with call bell/phone within reach   PT Visit Diagnosis: Unsteadiness on feet (R26.81);Muscle weakness (generalized) (M62.81);Difficulty in walking, not elsewhere classified (R26.2)     Time: [id number] PT Time Calculation (min) (ACUTE ONLY): 38 min  Charges:  $Therapeutic Activity: 38-52 mins                     Gary [name], PT, MPT    Gary [name] 06/26/2022, 2:41 PM

## 2022-06-26 NOTE — Progress Notes (Signed)
Patient declined respiratory care/services at this time, and stated that trach care was done by self. Patient did not want to attempt trach care with RT at this time. Patient also stated that he will not be wearing his home vent tonight.

## 2022-06-26 NOTE — ED Provider Notes (Signed)
    06/25/2022    5:30 PM 06/25/2022    9:05 AM 06/24/2022   12:45 AM  Vitals with BMI  Systolic 032 122   Diastolic 78 78   Pulse 81 91 78    Patient resting comfortably during my shift.  No acute events in the last 24 hours.  Patient is awaiting social work placement.   Rada Hay, MD 06/26/22 [phone]

## 2022-06-26 NOTE — ED Notes (Signed)
RN answered call light. Patient yelling at RN to fix his purwick. RN assessed suction and purwick draining. Patient continued to yell at RN. RN informed patient to not speak to her that way. Patient cont to yell. RN walked out of room. Patient awake, alert and showing no signs of distress.

## 2022-06-27 NOTE — Progress Notes (Signed)
Inpatient Rehabilitation Admissions Coordinator   Discharge recommendation updated to AIR rehab on 05/31/22. CIR evaluated patient on 05/31/22 and per note, he is not a candidate for CIR admit. He lacks the medical necessity for hospital level rehab. He is in the ED 62 days to date on this admission. He has failed discharge home in December. Needs long term rehab placement, not short term AIR level rehab. Numerous barriers to SNF placement. Please call with any questions.  Danne Baxter, RN, MSN Rehab Admissions Coordinator [phone] 06/27/2022 11:16 AM

## 2022-06-27 NOTE — Progress Notes (Signed)
Occupational Therapy Treatment Patient Details Name: Gary [name] MRN: [id number] DOB: November 30, 1955 Today's Date: 06/27/2022   History of present illness Gary [name] is a 83yoM who comes to [facility] on 04/26/21 reports his self care needs not being met after prior DC, staffing issues in home and reportedly not able to get his medications. Pt was DC from here 10 days prior after admission for respiratory distress, found to have active influenza infection, also CHF exacerbation. PMH: morbid obesity, CRF s/p tracheostomy c PMV when awake and triology vent support at when sleeping, HTN, COPD   OT comments  Gary [name] was seen for OT treatment on this date. Upon arrival to room pt reclined in bed, agreeable to tx. Pt requires CGA + RW sit<>stand x2 and steps along EOB. MAX A pericare standing. Pt making good progress toward goals, will continue to follow POC. Discharge recommendation remains appropriate.     Recommendations for follow up therapy are one component of a multi-disciplinary discharge planning process, led by the attending physician.  Recommendations may be updated based on patient status, additional functional criteria and insurance authorization.    Follow Up Recommendations  Acute inpatient rehab (3hours/day)     Assistance Recommended at Discharge Frequent or constant Supervision/Assistance  Patient can return home with the following  Two people to help with walking and/or transfers;Two people to help with bathing/dressing/bathroom;Assistance with cooking/housework;Help with stairs or ramp for entrance;Assist for transportation   Equipment Recommendations  Hospital bed    Recommendations for Other Services      Precautions / Restrictions Precautions Precautions: Fall Restrictions Weight Bearing Restrictions: No       Mobility Bed Mobility Overal bed mobility: Needs Assistance Bed Mobility: Supine to Sit, Sit to Supine     Supine to sit: Min assist Sit to supine: Mod  assist        Transfers Overall transfer level: Needs assistance Equipment used: Rolling walker (2 wheels) Transfers: Sit to/from Stand Sit to Stand: Min guard, From elevated surface                 Balance Overall balance assessment: Needs assistance Sitting-balance support: Feet supported Sitting balance-[name] Scale: Normal     Standing balance support: Bilateral upper extremity supported Standing balance-[name] Scale: Fair                             ADL either performed or assessed with clinical judgement   ADL Overall ADL's : Needs assistance/impaired                                       General ADL Comments: CGA + RW simulated BSC t/f. MAX A pericare standing.      Cognition Arousal/Alertness: Awake/alert Behavior During Therapy: WFL for tasks assessed/performed Overall Cognitive Status: Within Functional Limits for tasks assessed                                           Pertinent Vitals/ Pain       Pain Assessment Pain Assessment: No/denies pain   Frequency  Min 2X/week        Progress Toward Goals  OT Goals(current goals can now be found in the care plan section)  Progress towards OT goals: Progressing  toward goals  Acute Rehab OT Goals Patient Stated Goal: to go home OT Goal Formulation: With patient Time For Goal Achievement: 07/17/22 Potential to Achieve Goals: Fair ADL Goals Pt Will Perform Grooming: sitting;with modified independence Pt Will Perform Lower Body Dressing: with min assist;sitting/lateral leans;with adaptive equipment Pt Will Transfer to Toilet: bedside commode;stand pivot transfer;with min assist;with +2 assist Pt/caregiver will Perform Home Exercise Program: Increased strength;Both right and left upper extremity;With theraband;With written HEP provided;Independently  Plan Discharge plan remains appropriate;Frequency remains appropriate    Co-evaluation                  AM-PAC OT "6 Clicks" Daily Activity     Outcome Measure   Help from another person eating meals?: None Help from another person taking care of personal grooming?: A Little Help from another person toileting, which includes using toliet, bedpan, or urinal?: A Lot Help from another person bathing (including washing, rinsing, drying)?: A Lot Help from another person to put on and taking off regular upper body clothing?: A Little Help from another person to put on and taking off regular lower body clothing?: A Lot 6 Click Score: 16    End of Session    OT Visit Diagnosis: Other abnormalities of gait and mobility (R26.89);Muscle weakness (generalized) (M62.81)   Activity Tolerance Patient tolerated treatment well   Patient Left with call bell/phone within reach;in chair   Nurse Communication Mobility status;Need for lift equipment        Time: 1326-1359 OT Time Calculation (min): 33 min  Charges: OT General Charges $OT Visit: 1 Visit OT Treatments $Self Care/Home Management : 23-37 mins  Gary [name], M.S. OTR/L  06/27/22, 2:38 PM  ascom [phone]

## 2022-06-27 NOTE — Progress Notes (Signed)
Gary [name] refused respiratory services this evening, declined assistance with wearing his home vent, and did not want assistance with his trach care. Note: Patient performs this task independently. Home vent was placed on a second tray table next to the bedside for patient to place himself on and off, patient has this knowledge and can perform this task independently.

## 2022-06-27 NOTE — ED Provider Notes (Signed)
Emergency Medicine Observation Re-evaluation Note  Physical Exam   BP 119/68 (BP Location: Right Arm)   Pulse 86   Temp 98.5 F (36.9 C) (Oral)   Resp 20   Ht '6\' 3"'$  (1.905 m)   Wt (!) 177.4 kg   SpO2 96%   BMI 48.87 kg/m   Patient appears in no acute distress.  ED Course / MDM   No reported events during my shift at the time of this note.   Pt is awaiting dispo from SW   Lucillie Garfinkel MD    Lucillie Garfinkel, MD 06/27/22 [phone]

## 2022-06-28 MED ORDER — TORSEMIDE 20 MG PO TABS
20.0000 mg | ORAL_TABLET | Freq: Once | ORAL | Status: AC
  Administered 2022-06-28: 20 mg via ORAL
  Filled 2022-06-28: qty 1

## 2022-06-28 NOTE — ED Notes (Signed)
Pt's male purewick leaking. Cleaned patient up and replaced purewick.

## 2022-06-28 NOTE — Progress Notes (Signed)
PT Cancellation Note  Patient Details Name: Gary [name] MRN: [id number] DOB: Jan 05, 1956   Cancelled Treatment:    Reason Eval/Treat Not Completed: Patient declined, no reason specified (patient wants therapy to return tomorrow)  [name], PT, MPT  [name] 06/28/2022, 3:52 PM

## 2022-06-28 NOTE — Progress Notes (Signed)
Patient not ready for HS, has home vent on standby at the bedside and can perform task independently when he is ready. PT informed to request RT if assistance is needed. Stable at this time watching sports on TV.

## 2022-06-28 NOTE — ED Provider Notes (Signed)
    06/28/2022   12:07 AM 06/27/2022   10:00 AM 06/26/2022   10:41 PM  Vitals with BMI  Systolic 828  003  Diastolic 75  68  Pulse 77 83 86    Patient has been resting comfortably for the entirety of my shift.  No events reported to me by nursing staff.  He is awaiting social work disposition.   Rada Hay, MD 06/28/22 [phone]

## 2022-06-28 NOTE — ED Notes (Signed)
Checked on pt at this time. Asked for blanket and a cup of ice water. Both requests accommodated at this time.

## 2022-06-28 NOTE — ED Notes (Signed)
Urine container emptied and patient cleaned up. No other complaints or requests at this time.

## 2022-06-28 NOTE — ED Notes (Signed)
Patient with urinary incontinence on chux. Patient repositioned onto right side and pericare performed. While RN performing pericare, patient with very large episode of flatulence. Patient encouraged to use barrier wipes to wipe himself. Patient stated, "Oh, you don't want to help?" Patient informed that RN was happy to help but found intentional flatulence while staff providing pericare disrespectful. Patient did not demonstrate understanding. 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 No distress noted at this time.

## 2022-06-29 NOTE — ED Notes (Signed)
Pt placed on bedpan

## 2022-06-29 NOTE — Progress Notes (Signed)
PT Cancellation Note  Patient Details Name: Gary [name] MRN: [id number] DOB: February 03, 1956   Cancelled Treatment:     PT attempt. Pt was eating breakfast. " I'm gonna get a bath after I eat." Author offered to return after bath this morning but pt requested to wait till after lunch to do his PT. Will return later this date as requested   [name] 06/29/2022, 9:48 AM

## 2022-06-29 NOTE — Progress Notes (Signed)
Occupational Therapy Treatment Patient Details Name: Gary [name] MRN: [id number] DOB: January 21, 1956 Today's Date: 06/29/2022   History of present illness Gary [name] is a 60yoM who comes to [facility] on 04/26/21 reports his self care needs not being met after prior DC, staffing issues in home and reportedly not able to get his medications. Pt was DC from here 10 days prior after admission for respiratory distress, found to have active influenza infection, also CHF exacerbation. PMH: morbid obesity, CRF s/p tracheostomy c PMV when awake and triology vent support at when sleeping, HTN, COPD   OT comments  Gary [name] was seen for OT treatment on this date. Upon arrival to room pt in bed, agreeable to tx. Pt requires MIN A exit bed. CGA + RW for functional mobility ~25 ft x2. Pt making good progress toward goals, will continue to follow POC. Discharge recommendation remains appropriate.     Recommendations for follow up therapy are one component of a multi-disciplinary discharge planning process, led by the attending physician.  Recommendations may be updated based on patient status, additional functional criteria and insurance authorization.    Follow Up Recommendations  Acute inpatient rehab (3hours/day)     Assistance Recommended at Discharge Frequent or constant Supervision/Assistance  Patient can return home with the following  Two people to help with walking and/or transfers;Two people to help with bathing/dressing/bathroom;Assistance with cooking/housework;Help with stairs or ramp for entrance;Assist for transportation   Equipment Recommendations  Hospital bed    Recommendations for Other Services      Precautions / Restrictions Precautions Precautions: Fall Restrictions Weight Bearing Restrictions: No       Mobility Bed Mobility Overal bed mobility: Needs Assistance Bed Mobility: Supine to Sit, Sit to Supine     Supine to sit: Min assist Sit to supine: Mod assist         Transfers Overall transfer level: Needs assistance Equipment used: Rolling walker (2 wheels) Transfers: Sit to/from Stand Sit to Stand: Min guard, From elevated surface                 Balance Overall balance assessment: Needs assistance Sitting-balance support: Feet supported Sitting balance-[name] Scale: Normal     Standing balance support: Bilateral upper extremity supported Standing balance-[name] Scale: Fair                             ADL either performed or assessed with clinical judgement   ADL Overall ADL's : Needs assistance/impaired                                       General ADL Comments: CGA + RW simulated toilet and BSC t/f.      Cognition Arousal/Alertness: Awake/alert Behavior During Therapy: WFL for tasks assessed/performed Overall Cognitive Status: Within Functional Limits for tasks assessed                                           Pertinent Vitals/ Pain       Pain Assessment Pain Assessment: No/denies pain   Frequency  Min 2X/week        Progress Toward Goals  OT Goals(current goals can now be found in the care plan section)  Progress towards OT goals: Progressing toward goals  Acute Rehab OT Goals Patient Stated Goal: to walk OT Goal Formulation: With patient Time For Goal Achievement: 07/17/22 Potential to Achieve Goals: Fair ADL Goals Pt Will Perform Grooming: sitting;with modified independence Pt Will Perform Lower Body Dressing: with min assist;sitting/lateral leans;with adaptive equipment Pt Will Transfer to Toilet: bedside commode;stand pivot transfer;with min assist;with +2 assist Pt/caregiver will Perform Home Exercise Program: Increased strength;Both right and left upper extremity;With theraband;With written HEP provided;Independently  Plan Discharge plan remains appropriate;Frequency remains appropriate    Co-evaluation                 AM-PAC OT "6 Clicks"  Daily Activity     Outcome Measure   Help from another person eating meals?: None Help from another person taking care of personal grooming?: A Little Help from another person toileting, which includes using toliet, bedpan, or urinal?: A Lot Help from another person bathing (including washing, rinsing, drying)?: A Lot Help from another person to put on and taking off regular upper body clothing?: A Little Help from another person to put on and taking off regular lower body clothing?: A Lot 6 Click Score: 16    End of Session    OT Visit Diagnosis: Other abnormalities of gait and mobility (R26.89);Muscle weakness (generalized) (M62.81)   Activity Tolerance Patient tolerated treatment well   Patient Left with call bell/phone within reach;in chair   Nurse Communication Mobility status;Need for lift equipment        Time: [id number] OT Time Calculation (min): 34 min  Charges: OT General Charges $OT Visit: 1 Visit OT Treatments $Self Care/Home Management : 8-22 mins $Therapeutic Activity: 8-22 mins  [name], M.S. OTR/L  06/29/22, 3:43 PM  ascom [phone]

## 2022-06-29 NOTE — ED Notes (Signed)
Provided pt with some warm blankets, no other needs at this time

## 2022-06-29 NOTE — ED Notes (Signed)
Assumed care of pt. Pt sleeping at this time

## 2022-06-29 NOTE — ED Notes (Signed)
Pts bedpan removed and pt cleaned. Pts back cleaned with bath wipes. Pt on phone ordering his lunch tray. [place] with warm water and soap given to pt so he can clean his face and front side. Pt states he can not clean his privates. Pt told we will wait for PT so he can stand to clean that area and will change bed linens at that time.

## 2022-06-29 NOTE — ED Provider Notes (Signed)
-----------------------------------------   5:44 AM on 06/29/2022 -----------------------------------------   Blood pressure 112/66, pulse 94, temperature 97.6 F (36.4 C), temperature source Oral, resp. rate 18, height 6\' 3"  (1.905 m), weight (!) 177.4 kg, SpO2 95 %.  The patient is calm and cooperative at this time.  There have been no acute events since the last update.  Awaiting disposition plan from Social Work team.   Paulette Blanch, MD 06/29/22 [phone]

## 2022-06-30 NOTE — ED Notes (Signed)
Patient resting comfortably in bed. NAD noted, respirations even and unlabored. No needs expressed by patient at this time. Call light within reach.

## 2022-06-30 NOTE — Progress Notes (Signed)
Pt initiated on home vent

## 2022-06-30 NOTE — ED Notes (Signed)
Responded to call bell. Pt stated that he felt like he needed to have a bowel movement. Pt placed on a bedpan. After 10 minutes he said he thought he might be constipated and wasn't able to pass any stool. Bed pan removed and patient hygiene performed. Pt returned to comfortable position. Pt states he will try again later.

## 2022-06-30 NOTE — ED Provider Notes (Signed)
-----------------------------------------   6:23 AM on 06/30/2022 -----------------------------------------   Blood pressure 112/66, pulse 94, temperature 97.6 F (36.4 C), temperature source Oral, resp. rate 18, height 1.905 m (6\' 3" ), weight (!) 177.4 kg, SpO2 94 %.  The patient is calm and cooperative at this time.  There have been no acute events since the last update.  Awaiting disposition plan from [facility] team.   Hinda Kehr, MD 06/30/22 [phone]

## 2022-06-30 NOTE — ED Notes (Signed)
Patient given orange juice as requested. No new needs expressed at this time. NAD noted.

## 2022-07-01 NOTE — ED Provider Notes (Signed)
-----------------------------------------   6:02 AM on 07/01/2022 -----------------------------------------   Blood pressure 123/77, pulse 85, temperature 98 F (36.7 C), temperature source Oral, resp. rate 19, height 6\' 3"  (1.905 m), weight (!) 177.4 kg, SpO2 95 %.  The patient is calm and cooperative at this time.  There have been no acute events since the last update.  Awaiting disposition plan from case management/social work.    Donte Kary, Delice Bison, DO 07/01/22 [phone]

## 2022-07-01 NOTE — Progress Notes (Signed)
Pt taken off home vent & placed back on 28% ATC without incident

## 2022-07-01 NOTE — ED Notes (Signed)
Pt pads changed, peri care preformed, new purewick placed. New suction and younker given to patient. Pt given new warm blankets.

## 2022-07-01 NOTE — ED Notes (Signed)
Called to pts room. Pt requested and given juice. Pt denies other needs at this time.

## 2022-07-02 NOTE — ED Notes (Signed)
Patient resting in bed free from sign of distress. Breathing unlabored speaking in full sentences with symmetric chest rise and fall. Bed low and locked with side rails raised x2. Call bell in reach  

## 2022-07-02 NOTE — ED Notes (Signed)
Pt cleaned of urine, dry pad placed under pt, new male urinary suction device placed on pt. Pt positioned for comfort, pt given water and lights dimmed. Pt denies other needs at this time.

## 2022-07-02 NOTE — ED Provider Notes (Signed)
-----------------------------------------   8:25 AM on 07/02/2022 -----------------------------------------   Blood pressure 126/68, pulse 82, temperature 98.8 F (37.1 C), temperature source Oral, resp. rate 20, height 1.905 m (6\' 3" ), weight (!) 177.4 kg, SpO2 92 %.  The patient is calm and cooperative at this time.  There have been no acute events since the last update.  Awaiting disposition plan from [facility] team.   Hinda Kehr, MD 07/02/22 [phone]

## 2022-07-02 NOTE — Progress Notes (Signed)
Pt stated he performed self trach care this am

## 2022-07-02 NOTE — Progress Notes (Signed)
Physical Therapy Treatment Patient Details Name: Gary [name] MRN: [id number] DOB: 08/07/1955 Today's Date: 07/02/2022   History of Present Illness Gary [name] is a 8yoM who comes to [facility] on 04/26/21 reports his self care needs not being met after prior DC, staffing issues in home and reportedly not able to get his medications. Pt was DC from here 10 days prior after admission for respiratory distress, found to have active influenza infection, also CHF exacerbation. PMH: morbid obesity, CRF s/p tracheostomy c PMV when awake and triology vent support at when sleeping, HTN, COPD    PT Comments    Patient is agreeable to PT. He reports knee stiffness with standing. He was able to take several steps from bed to chair with Min guard using rolling walker. Further ambulation self limited related to knee stiffness. Encouraged patient to sit up in chair for upright conditioning. Recommend to continue PT to maximize independence and facilitate return to prior level of function.    Recommendations for follow up therapy are one component of a multi-disciplinary discharge planning process, led by the attending physician.  Recommendations may be updated based on patient status, additional functional criteria and insurance authorization.  Follow Up Recommendations  Acute inpatient rehab (3hours/day) Can patient physically be transported by private vehicle: No   Assistance Recommended at Discharge Intermittent Supervision/Assistance  Patient can return home with the following Assist for transportation;Help with stairs or ramp for entrance;A lot of help with walking and/or transfers;A little help with bathing/dressing/bathroom;Assistance with cooking/housework   Equipment Recommendations  None recommended by PT    Recommendations for Other Services       Precautions / Restrictions Precautions Precautions: Fall Restrictions Weight Bearing Restrictions: No     Mobility  Bed Mobility Overal bed  mobility: Needs Assistance Bed Mobility: Supine to Sit, Sit to Supine     Supine to sit: Min assist     General bed mobility comments: assistance for trunk support to achieve upright sitting position. intermittent assistance for LLE support    Transfers Overall transfer level: Needs assistance Equipment used: Rolling walker (2 wheels) Transfers: Sit to/from Stand Sit to Stand: Min guard, From elevated surface           General transfer comment: 2 bouts of standing from elevated bed height. cues for anterior weight shifting    Ambulation/Gait Ambulation/Gait assistance: Min guard Gait Distance (Feet): 4 Feet Assistive device: Rolling walker (2 wheels)         General Gait Details: patient took several steps from bed to chair with rolling walker. occasional cues for safety. further walking self limtied due to reported   Stairs             Wheelchair Mobility    Modified Rankin (Stroke Patients Only)       Balance Overall balance assessment: Needs assistance Sitting-balance support: Feet supported Sitting balance-[name] Scale: Normal     Standing balance support: Bilateral upper extremity supported Standing balance-[name] Scale: Fair                              Cognition Arousal/Alertness: Awake/alert Behavior During Therapy: WFL for tasks assessed/performed Overall Cognitive Status: Within Functional Limits for tasks assessed                                          Exercises  General Comments General comments (skin integrity, edema, etc.): encouraged patient to sit up in chair at end of session for upright conditioning      Pertinent Vitals/Pain Pain Assessment Pain Assessment: No/denies pain    Home Living                          Prior Function            PT Goals (current goals can now be found in the care plan section) Acute Rehab PT Goals Patient Stated Goal: to walk and go home PT  Goal Formulation: With patient Time For Goal Achievement: 07/05/22 Potential to Achieve Goals: Fair Progress towards PT goals: Progressing toward goals    Frequency    Min 2X/week      PT Plan Current plan remains appropriate    Co-evaluation              AM-PAC PT "6 Clicks" Mobility   Outcome Measure  Help needed turning from your back to your side while in a flat bed without using bedrails?: A Lot Help needed moving from lying on your back to sitting on the side of a flat bed without using bedrails?: A Lot Help needed moving to and from a bed to a chair (including a wheelchair)?: A Lot Help needed standing up from a chair using your arms (e.g., wheelchair or bedside chair)?: Total Help needed to walk in hospital room?: Total Help needed climbing 3-5 steps with a railing? : Total 6 Click Score: 9    End of Session Equipment Utilized During Treatment: Oxygen Activity Tolerance: Patient tolerated treatment well Patient left: in chair;with call bell/phone within reach   PT Visit Diagnosis: Unsteadiness on feet (R26.81);Muscle weakness (generalized) (M62.81);Difficulty in walking, not elsewhere classified (R26.2)     Time: [id number] PT Time Calculation (min) (ACUTE ONLY): 35 min  Charges:  $Therapeutic Activity: 23-37 mins                     [name], PT, MPT   [name] 07/02/2022, 2:22 PM

## 2022-07-02 NOTE — TOC Progression Note (Signed)
Transition of Care [facility]) - Progression Note    Patient Details  Name: Gary [name] MRN: [id number] Date of Birth: 05/08/1955  Transition of Care [facility]) CM/SW Contact  Ross Ludwig, [place] Phone Number: 07/02/2022, 9:37 PM  Clinical Narrative:     Patient still does not have any bed offers.   Expected Discharge Plan: [place] Barriers to Discharge: ED Unsafe disposition  Expected Discharge Plan and Services   Discharge Planning Services: CM Consult Post Acute Care Choice: [place] Living arrangements for the past 2 months: Single Family Home                 DME Arranged: N/A DME Agency: NA                   Social Determinants of Health (SDOH) Interventions SDOH Screenings   Food Insecurity: No Food Insecurity (04/15/2022)  Housing: Low Risk  (04/15/2022)  Transportation Needs: Unmet Transportation Needs (04/15/2022)  Utilities: Not At Risk (04/15/2022)  Recent Concern: Utilities - At Risk (02/05/2022)  Depression (PHQ2-9): Low Risk  (06/07/2021)  Financial Resource Strain: Medium Risk (05/03/2017)  Physical Activity: Insufficiently Active (05/03/2017)  Social Connections: Moderately Integrated (05/03/2017)  Stress: No Stress Concern Present (05/03/2017)  Tobacco Use: Medium Risk (04/26/2022)    Readmission Risk Interventions    12/31/2021    8:58 AM 12/20/2021    4:21 PM 05/04/2021    1:26 PM  Readmission Risk Prevention Plan  Transportation Screening Complete Complete Complete  Medication Review Press photographer) Complete Complete Complete  PCP or Specialist appointment within 3-5 days of discharge Complete Complete Complete  HRI or Home Care Consult Complete Complete Complete  SW Recovery Care/Counseling Consult Complete Complete Complete  Palliative Care Screening Complete Complete Not [place] Not Applicable Not Applicable Not Applicable

## 2022-07-02 NOTE — ED Notes (Signed)
Patient back to bed with assistance of walker. Patient repositioned. Urine canister emptied. Patient given cola as requested. Call light within reach. No needs expressed at this time.

## 2022-07-03 MED ORDER — POLYETHYLENE GLYCOL 3350 17 G PO PACK: 17.0000 g | PACK | Freq: Every day | ORAL | Status: DC

## 2022-07-03 MED ORDER — POLYETHYLENE GLYCOL 3350 17 G PO PACK
17.0000 g | PACK | Freq: Once | ORAL | Status: AC
  Administered 2022-07-03: 17 g via ORAL
  Filled 2022-07-03: qty 1

## 2022-07-03 NOTE — ED Notes (Signed)
Patient called out requesting use of bedpan. Pt assisted with getting on bedpan. Pt unable to have complete bm and requests a dose of miralax. RN cleansed pt and adjusted chux pads beneath pt to ensure no wrinkles. RN also adjusted pt gown and replaced pt blankets on pt. Pt positioned self for comfort and denies further needs at this time. Pt noted to be alert and oriented. Breathing unlabored speaking in full sentences with symmetric chest rise and fall. Pt watching tv. Call bell and personal belongings within reach. Bed low and locked with rails raised.

## 2022-07-03 NOTE — ED Provider Notes (Signed)
-----------------------------------------   4:59 AM on 07/03/2022 -----------------------------------------   Blood pressure 126/78, pulse 85, temperature 98.8 F (37.1 C), temperature source Oral, resp. rate 18, height 6\' 3"  (1.905 m), weight (!) 177.4 kg, SpO2 98 %.  The patient is calm and cooperative at this time.  There have been no acute events since the last update.  Awaiting disposition plan from Social Work team.   Paulette Blanch, MD 07/03/22 [phone]

## 2022-07-03 NOTE — Evaluation (Signed)
Physical Therapy Evaluation Patient Details Name: Gary [name] MRN: [id number] DOB: 1955-07-01 Today's Date: 07/03/2022  History of Present Illness  Gary [name] is a 29yoM who comes to [facility] on 04/26/21 reports his self care needs not being met after prior DC, staffing issues in home and reportedly not able to get his medications. Pt was DC from here 10 days prior after admission for respiratory distress, found to have active influenza infection, also CHF exacerbation. PMH: morbid obesity, CRF s/p tracheostomy c PMV when awake and triology vent support at when sleeping, HTN, COPD   Clinical Impression  Patient agreeable to PT. Gait distance limited today by left knee stiffness/soreness. Patient ambulated in hallway with chair follow using rolling walker with occasional cues for safety and to take rest breaks as needed. No significant dyspnea with exertion. Patient continues to require significantly elevated surfaces to stand due to generalized leg weakness. Recommend to continue PT to maximize independence and decrease caregiver burden.       Recommendations for follow up therapy are one component of a multi-disciplinary discharge planning process, led by the attending physician.  Recommendations may be updated based on patient status, additional functional criteria and insurance authorization.  Follow Up Recommendations Acute inpatient rehab (3hours/day) Can patient physically be transported by private vehicle: No    Assistance Recommended at Discharge Intermittent Supervision/Assistance  Patient can return home with the following  Assist for transportation;Help with stairs or ramp for entrance;A lot of help with walking and/or transfers;A little help with bathing/dressing/bathroom;Assistance with cooking/housework    Equipment Recommendations None recommended by PT  Recommendations for Other Services       Functional Status Assessment       Precautions / Restrictions  Precautions Precautions: Fall Restrictions Weight Bearing Restrictions: No      Mobility  Bed Mobility Overal bed mobility: Needs Assistance Bed Mobility: Supine to Sit, Sit to Supine     Supine to sit: Min assist Sit to supine: Mod assist   General bed mobility comments: assistance for LLE and trunk support to sit upright. assistance for BLE support to return to bed. patient uses the trendelenberg function of the bed to reposition independently    Transfers Overall transfer level: Needs assistance Equipment used: Rolling walker (2 wheels) Transfers: Sit to/from Stand Sit to Stand: Min guard, From elevated surface           General transfer comment: cues for anterior weight shifting for safety. 3 bouts of standing performed, from bed and from reclier chair at a height of at least 30 inches. patient continues to require elevated surfaces to stand which limits safe discharge    Ambulation/Gait Ambulation/Gait assistance: Supervision, Min guard Gait Distance (Feet): 25 Feet Assistive device: Rolling walker (2 wheels)   Gait velocity: decreased     General Gait Details: chair follow for safety. no physical assistance reuqired. occasional cues for safety and for rest breaks as needed. activity tolerance limited by left knee discomfort  Stairs            Wheelchair Mobility    Modified Rankin (Stroke Patients Only)       Balance Overall balance assessment: Needs assistance Sitting-balance support: Feet supported Sitting balance-[name] Scale: Normal     Standing balance support: Bilateral upper extremity supported Standing balance-[name] Scale: Fair                               Pertinent Vitals/Pain Pain  Assessment Pain Assessment: Faces Faces Pain Scale: Hurts little more Pain Location: L knee Pain Descriptors / Indicators: Sore Pain Intervention(s): Limited activity within patient's tolerance, Monitored during session, Repositioned     Home Living                          Prior Function                       Hand Dominance        Extremity/Trunk Assessment                Communication      Cognition Arousal/Alertness: Awake/alert Behavior During Therapy: WFL for tasks assessed/performed Overall Cognitive Status: Within Functional Limits for tasks assessed                                          General Comments General comments (skin integrity, edema, etc.): patient could benefit from continued routine mobility up to chair, with staff assistance, for upright conditioning    Exercises     Assessment/Plan    PT Assessment    PT Problem List         PT Treatment Interventions      PT Goals (Current goals can be found in the Care Plan section)  Acute Rehab PT Goals Patient Stated Goal: to walk and go home PT Goal Formulation: With patient Time For Goal Achievement: 07/05/22 Potential to Achieve Goals: Fair    Frequency Min 2X/week     Co-evaluation   Reason for Co-Treatment: For patient/therapist safety;To address functional/ADL transfers PT goals addressed during session: Mobility/safety with mobility OT goals addressed during session: ADL's and self-care       AM-PAC PT "6 Clicks" Mobility  Outcome Measure Help needed turning from your back to your side while in a flat bed without using bedrails?: A Lot Help needed moving from lying on your back to sitting on the side of a flat bed without using bedrails?: A Lot Help needed moving to and from a bed to a chair (including a wheelchair)?: A Lot Help needed standing up from a chair using your arms (e.g., wheelchair or bedside chair)?: Total Help needed to walk in hospital room?: Total Help needed climbing 3-5 steps with a railing? : Total 6 Click Score: 9    End of Session Equipment Utilized During Treatment: Oxygen Activity Tolerance: Patient tolerated treatment well Patient left: with  call bell/phone within reach   PT Visit Diagnosis: Unsteadiness on feet (R26.81);Muscle weakness (generalized) (M62.81);Difficulty in walking, not elsewhere classified (R26.2)    Time: [id number] PT Time Calculation (min) (ACUTE ONLY): 40 min   Charges:     PT Treatments $Therapeutic Activity: 23-37 mins        Gary [name], PT, MPT   [name] 07/03/2022, 2:57 PM

## 2022-07-03 NOTE — ED Notes (Signed)
Pt in NAD; pt resting in bed watching tv.

## 2022-07-03 NOTE — ED Notes (Signed)
Pt placed on vent by RT. Pt resting in bed free from sing of distress. Bed low and locked with side rails raised. Pt watching tv. Denies needs at this time.

## 2022-07-03 NOTE — ED Notes (Signed)
Patient resting in bed free from sign of distress. Breathing unlabored speaking in full sentences with symmetric chest rise and fall. Bed low and locked with side rails raised x2. Call bell in reach  

## 2022-07-03 NOTE — ED Notes (Signed)
Patient called out requesting 2 cranberry juices and a cup of ice water. Beverages provided to patient.   Patient resting in bed free from sign of distress. Breathing unlabored speaking in full sentences with symmetric chest rise and fall. Bed low and locked with side rails raised x2. Call bell in reach.

## 2022-07-03 NOTE — ED Notes (Signed)
Pt sleeping. NAD noted.

## 2022-07-03 NOTE — ED Notes (Signed)
Pt still asleep, NAD

## 2022-07-03 NOTE — ED Notes (Signed)
Canister emptied that was full of urine, placed back on purewick. Pt expresses no needs at this time.

## 2022-07-03 NOTE — Progress Notes (Signed)
Occupational Therapy Treatment Patient Details Name: Gary [name] MRN: [id number] DOB: [birth year] Today's Date: 07/03/2022   History of present illness Gary [name] is a 79yoM who comes to [facility] on 04/26/21 reports his self care needs not being met after prior DC, staffing issues in home and reportedly not able to get his medications. Pt was DC from here 10 days prior after admission for respiratory distress, found to have active influenza infection, also CHF exacerbation. PMH: morbid obesity, CRF s/p tracheostomy c PMV when awake and triology vent support at when sleeping, HTN, COPD   OT comments  Mr Simonet was seen for OT/PT co-treatment on this date. Upon arrival to room pt seated EOB, agreeable to tx. Pt requires CGA + RW sit<>stand from max elevated bed and chair heights. Tolerates ~15 ft mobility, limited by L knee soreness this date. Pt making good progress toward goals, will continue to follow POC. Discharge recommendation remains appropriate.     Recommendations for follow up therapy are one component of a multi-disciplinary discharge planning process, led by the attending physician.  Recommendations may be updated based on patient status, additional functional criteria and insurance authorization.    Follow Up Recommendations  Acute inpatient rehab (3hours/day)     Assistance Recommended at Discharge Frequent or constant Supervision/Assistance  Patient can return home with the following  Two people to help with walking and/or transfers;Two people to help with bathing/dressing/bathroom;Assistance with cooking/housework;Help with stairs or ramp for entrance;Assist for transportation   Equipment Recommendations  Hospital bed    Recommendations for Other Services      Precautions / Restrictions Precautions Precautions: Fall Restrictions Weight Bearing Restrictions: No       Mobility Bed Mobility               General bed mobility comments: received and left sitting     Transfers Overall transfer level: Needs assistance Equipment used: Rolling walker (2 wheels) Transfers: Sit to/from Stand Sit to Stand: Min guard, From elevated surface                 Balance Overall balance assessment: Needs assistance Sitting-balance support: Feet supported Sitting balance-[name] Scale: Normal     Standing balance support: Bilateral upper extremity supported Standing balance-[name] Scale: Fair                             ADL either performed or assessed with clinical judgement   ADL Overall ADL's : Needs assistance/impaired                                       General ADL Comments: CGA + RW simulated toilet and BSC t/f.      Cognition Arousal/Alertness: Awake/alert Behavior During Therapy: WFL for tasks assessed/performed Overall Cognitive Status: Within Functional Limits for tasks assessed                                                     Pertinent Vitals/ Pain       Pain Assessment Pain Assessment: Faces Faces Pain Scale: Hurts little more Pain Location: L knee Pain Descriptors / Indicators: Sore Pain Intervention(s): Limited activity within patient's tolerance, Repositioned   Frequency  Min 2X/week  Progress Toward Goals  OT Goals(current goals can now be found in the care plan section)  Progress towards OT goals: Progressing toward goals  Acute Rehab OT Goals Patient Stated Goal: to go home OT Goal Formulation: With patient Time For Goal Achievement: 07/17/22 Potential to Achieve Goals: Fair ADL Goals Pt Will Perform Grooming: sitting;with modified independence Pt Will Perform Lower Body Dressing: with min assist;sitting/lateral leans;with adaptive equipment Pt Will Transfer to Toilet: bedside commode;stand pivot transfer;with min assist;with +2 assist Pt/caregiver will Perform Home Exercise Program: Increased strength;Both right and left upper extremity;With  theraband;With written HEP provided;Independently  Plan Discharge plan remains appropriate;Frequency remains appropriate    Co-evaluation    PT/OT/SLP Co-Evaluation/Treatment: Yes Reason for Co-Treatment: For patient/therapist safety;To address functional/ADL transfers PT goals addressed during session: Mobility/safety with mobility OT goals addressed during session: ADL's and self-care      AM-PAC OT "6 Clicks" Daily Activity     Outcome Measure   Help from another person eating meals?: None Help from another person taking care of personal grooming?: A Little Help from another person toileting, which includes using toliet, bedpan, or urinal?: A Lot Help from another person bathing (including washing, rinsing, drying)?: A Lot Help from another person to put on and taking off regular upper body clothing?: A Little Help from another person to put on and taking off regular lower body clothing?: A Lot 6 Click Score: 16    End of Session    OT Visit Diagnosis: Other abnormalities of gait and mobility (R26.89);Muscle weakness (generalized) (M62.81)   Activity Tolerance Patient tolerated treatment well   Patient Left with call bell/phone within reach;in chair   Nurse Communication Mobility status;Need for lift equipment        Time: [id number] OT Time Calculation (min): 19 min  Charges: OT General Charges $OT Visit: 1 Visit OT Treatments $Self Care/Home Management : 8-22 mins  [name], M.S. OTR/L  07/03/22, 2:42 PM  ascom [phone]

## 2022-07-03 NOTE — ED Provider Notes (Signed)
-----------------------------------------   7:43 AM on 07/03/2022 -----------------------------------------   Blood pressure 126/78, pulse 85, temperature 98.8 F (37.1 C), temperature source Oral, resp. rate 18, height 6\' 3"  (1.905 m), weight (!) 177.4 kg, SpO2 98 %.  The patient is calm and cooperative at this time.  Asking for dressing change for his trach.  There have been no acute events since the last update.  Awaiting disposition plan from case management/social work.    Nathaniel Man, MD 07/03/22 [phone]

## 2022-07-04 NOTE — ED Notes (Signed)
Patient resting in bed free from sign of distress. Breathing unlabored with symmetric chest rise and fall. Bed low and locked with side rails raised. Call bell in reach

## 2022-07-04 NOTE — Progress Notes (Signed)
Occupational Therapy Treatment Patient Details Name: Gary [name] MRN: [id number] DOB: Aug 18, 1955 Today's Date: 07/04/2022   History of present illness Gary [name] is a 76yoM who comes to [facility] on 04/26/21 reports his self care needs not being met after prior DC, staffing issues in home and reportedly not able to get his medications. Pt was DC from here 10 days prior after admission for respiratory distress, found to have active influenza infection, also CHF exacerbation. PMH: morbid obesity, CRF s/p tracheostomy c PMV when awake and triology vent support at when sleeping, HTN, COPD   OT comments  Gary [name] was seen for OT treatment on this date. Upon arrival to room pt reclined in bed, agreeable to tx. Pt requires MIN A exit bed. CGA sit<>stand from max elevated bed/chair height. Tolerates x60 ft, x50 ft. Pt making good progress toward goals, will continue to follow POC. Discharge recommendation remains appropriate.     Recommendations for follow up therapy are one component of a multi-disciplinary discharge planning process, led by the attending physician.  Recommendations may be updated based on patient status, additional functional criteria and insurance authorization.    Follow Up Recommendations  Acute inpatient rehab (3hours/day)     Assistance Recommended at Discharge Frequent or constant Supervision/Assistance  Patient can return home with the following  Two people to help with walking and/or transfers;Two people to help with bathing/dressing/bathroom;Assistance with cooking/housework;Help with stairs or ramp for entrance;Assist for transportation   Equipment Recommendations  Hospital bed    Recommendations for Other Services      Precautions / Restrictions Precautions Precautions: Fall Restrictions Weight Bearing Restrictions: No       Mobility Bed Mobility Overal bed mobility: Needs Assistance Bed Mobility: Supine to Sit, Sit to Supine     Supine to sit: Min  assist          Transfers Overall transfer level: Needs assistance Equipment used: Rolling walker (2 wheels) Transfers: Sit to/from Stand Sit to Stand: Min guard, From elevated surface                 Balance Overall balance assessment: Needs assistance Sitting-balance support: Feet supported Sitting balance-[name] Scale: Normal     Standing balance support: Bilateral upper extremity supported Standing balance-[name] Scale: Fair                             ADL either performed or assessed with clinical judgement   ADL Overall ADL's : Needs assistance/impaired                                       General ADL Comments: CGA + RW simulated toilet. MAX A don B shoes sitting      Cognition Arousal/Alertness: Awake/alert Behavior During Therapy: WFL for tasks assessed/performed Overall Cognitive Status: Within Functional Limits for tasks assessed                                                     Pertinent Vitals/ Pain       Pain Assessment Pain Assessment: No/denies pain   Frequency  Min 2X/week        Progress Toward Goals  OT Goals(current goals can now be found  in the care plan section)  Progress towards OT goals: Progressing toward goals  Acute Rehab OT Goals Patient Stated Goal: go home OT Goal Formulation: With patient Time For Goal Achievement: 07/17/22 Potential to Achieve Goals: Fair ADL Goals Pt Will Perform Grooming: sitting;with modified independence Pt Will Perform Lower Body Dressing: with min assist;sitting/lateral leans;with adaptive equipment Pt Will Transfer to Toilet: bedside commode;stand pivot transfer;with min assist;with +2 assist Pt/caregiver will Perform Home Exercise Program: Increased strength;Both right and left upper extremity;With theraband;With written HEP provided;Independently  Plan Discharge plan remains appropriate;Frequency remains appropriate    Co-evaluation     PT/OT/SLP Co-Evaluation/Treatment: Yes Reason for Co-Treatment: For patient/therapist safety;To address functional/ADL transfers PT goals addressed during session: Mobility/safety with mobility OT goals addressed during session: ADL's and self-care      AM-PAC OT "6 Clicks" Daily Activity     Outcome Measure   Help from another person eating meals?: None Help from another person taking care of personal grooming?: A Little Help from another person toileting, which includes using toliet, bedpan, or urinal?: A Lot Help from another person bathing (including washing, rinsing, drying)?: A Lot Help from another person to put on and taking off regular upper body clothing?: A Little Help from another person to put on and taking off regular lower body clothing?: A Lot 6 Click Score: 16    End of Session    OT Visit Diagnosis: Other abnormalities of gait and mobility (R26.89);Muscle weakness (generalized) (M62.81)   Activity Tolerance Patient tolerated treatment well   Patient Left with call bell/phone within reach;in chair   Nurse Communication Mobility status;Need for lift equipment        Time: 1332-1400 OT Time Calculation (min): 28 min  Charges: OT General Charges $OT Visit: 1 Visit OT Treatments $Self Care/Home Management : 8-22 mins $Therapeutic Activity: 8-22 mins   [name], M.S. OTR/L  07/04/22, 3:01 PM  ascom [phone]

## 2022-07-04 NOTE — Progress Notes (Signed)
Nutrition Note  RD consulted for assessment of nutritional requirements, calorie count, and weight loss.   Wt Readings from Last 15 Encounters:  05/04/22 (!) 177.4 kg  04/14/22 (!) 160.4 kg  04/06/22 (!) 193.2 kg  01/19/22 (!) 171.5 kg  01/03/22 (!) 171.5 kg  12/26/21 (!) 173.1 kg  12/12/21 (!) 183.7 kg  08/21/21 (!) 171.5 kg  08/15/21 (!) 181.8 kg  07/23/21 (!) 185.5 kg  07/05/21 (!) 180.1 kg  06/07/21 (!) 178.3 kg  05/11/21 (!) 181.4 kg  05/08/21 (!) 186.3 kg  04/11/21 (!) 181.8 kg   Pt with history of morbid obesity, GERD, hyperlipidemia, chronic respiratory failure status post tracheostomy, COPD, hypertension, who presents for chief concerns of shortness of breath.   Pt admitted with CHF exacerbation.   Pt familiar to this RD due to multiple prior admissions. Pt has been boarding in the ED for 69 days due to difficult placement.   RD saw pt alongside OT. Per discussion with OT, pt has has been making good progress with therapy. Pt has been able to ambulate in the hallways over the past 4-6 weeks and has been participating in bed to stand or bed to chair transfers since December 2023. Pt also able to perform all trach care himself.   Spoke with pt at bedside. He reports being pleased with the progress he has made and reports his intention to continue to work hard with his intention to return to his own apartment to care for himself. Pt shares he is extremely motivated to do so, as his sister is paying his rent so that he can return there when he is able. Pt shares with this RD that his main barriers to optimal mobilization are knee pain, fluid status, and his weight. Pt reports compliance with diet and consumes only food that is sent from the nutritional services department (nutritional intake in the last 24 hours: 1872 kcals and 109 grams protein). He knows that consuming excess sodium impacts his respiratory and fluid status and reports that he tries to avoid sodium at home, as he  attended culinary school and used to be employed as a Biomedical scientist.   Nutrition-Focused physical exam completed. Findings are no fat depletion, no muscle depletion, and mild edema.    Medications reviewed and include ferrous sulfate, melatonin, and demadex.  Labs reviewed: CBGS: 132.   Body mass index is 48.87 kg/m. Patient meets criteria for obesity, class III based on current BMI.   Current diet order is heart healthy/ carb modified, patient is consuming approximately 100% of meals at this time. Labs and medications reviewed. RD reviewed principles of diet order with pt and how this applies to his goals and care plan. Diet order and current nutrition care plan remain most appropriate for pt. RD has no further recommendations to add.   Assessment findings and recommendations discussed with RN, MD, and OT. Per discussion with RN and OT, plan to obtain weight next time therapy works with pt. Due to pt's wide stance, pt is unable to safely obtain a standing weight, so plan for bedscale wt. Per flowsheet, last weight was taken 2 months ago. Obtaining a current weight will better assist with monitoring pt's progress; RD ordered weight to do taken tomorrow (07/05/22).   No further nutrition interventions warranted at this time. If further nutrition issues arise, please consult RD.   Loistine Chance, RD, LDN, [place] Registered Dietitian II Certified Diabetes Care and Education Specialist Please refer to [facility] for RD and/or RD on-call/weekend/after  hours pager

## 2022-07-04 NOTE — TOC Progression Note (Signed)
Transition of Care [facility]) - Progression Note    Patient Details  Name: Gary [name] MRN: [id number] Date of Birth: 12/06/1955  Transition of Care [facility]) CM/SW Contact  Ross Ludwig, [place] Phone Number: 06/27/2022 9:38am  Clinical Narrative:     Patient has been refaxed out for bed offers, still waiting for bed offer.  Upper leadership is aware and assisting with patient since he is a difficult to place patient.  Expected Discharge Plan: [place] Barriers to Discharge: ED Unsafe disposition  Expected Discharge Plan and Services   Discharge Planning Services: CM Consult Post Acute Care Choice: [place] Living arrangements for the past 2 months: Single Family Home                 DME Arranged: N/A DME Agency: NA                   Social Determinants of Health (SDOH) Interventions SDOH Screenings   Food Insecurity: No Food Insecurity (04/15/2022)  Housing: Low Risk  (04/15/2022)  Transportation Needs: Unmet Transportation Needs (04/15/2022)  Utilities: Not At Risk (04/15/2022)  Recent Concern: Utilities - At Risk (02/05/2022)  Depression (PHQ2-9): Low Risk  (06/07/2021)  Financial Resource Strain: Medium Risk (05/03/2017)  Physical Activity: Insufficiently Active (05/03/2017)  Social Connections: Moderately Integrated (05/03/2017)  Stress: No Stress Concern Present (05/03/2017)  Tobacco Use: Medium Risk (04/26/2022)    Readmission Risk Interventions    12/31/2021    8:58 AM 12/20/2021    4:21 PM 05/04/2021    1:26 PM  Readmission Risk Prevention Plan  Transportation Screening Complete Complete Complete  Medication Review Press photographer) Complete Complete Complete  PCP or Specialist appointment within 3-5 days of discharge Complete Complete Complete  HRI or Home Care Consult Complete Complete Complete  SW Recovery Care/Counseling Consult Complete Complete Complete  Palliative Care Screening Complete Complete Not [place] Not Applicable Not Applicable Not Applicable

## 2022-07-04 NOTE — ED Notes (Addendum)
Patient used call light, states, "Send my nurse in!" When asked what patient needs patient again states, "Send my nurse in!" Upon entering room patient states "Take that trash off my table. Bring me cranberry juice and two chocolate ice creams. And a spoon." Ice cream, spoon and cranberry juice given to patient per request. Patient states, "Bring me a cup of ice." When RN asked patient why he did not originally ask for ice with his juice patient stated, "Because they always bring me ice with my juice." Cup of ice provided to patient. Patient states, "It seems like you are mad at me." RN informed patient of neutral attitude and informed patient that he is very demanding and rude and that RN will not greet patient with a smile but will continue to care for his needs Patient stated that RN should be happy that he only calls out once and that he rarely asks for things. Patient also states that his insurance pays the hospital and that the hospital pays the RN's paycheck and that RN is working for him. Patient informed that it is common courtesy to state "please" and "thank you" when making requests. Patient states that he will not beg. Patient states that God would not like how RN is treating patient. No physical distress noted. Bed in low position. Call light in reach. 500 ml urine emptied from external catheter.

## 2022-07-04 NOTE — ED Provider Notes (Signed)
-----------------------------------------   5:52 AM on 07/04/2022 -----------------------------------------   Blood pressure 121/74, pulse 78, temperature 97.8 F (36.6 C), temperature source Oral, resp. rate 17, height 6\' 3"  (1.905 m), weight (!) 177.4 kg, SpO2 95 %.  The patient is calm and cooperative at this time.  There have been no acute events since the last update.  Awaiting disposition plan from Social Work team.   Paulette Blanch, MD 07/04/22 [phone]

## 2022-07-04 NOTE — ED Notes (Signed)
Patient resting in bed with spontaneous regular respirations. 900 ml urine emptied from external catheter. No distress noted.

## 2022-07-04 NOTE — ED Notes (Signed)
Vital signs obtained and night time medications given to patient with ice water. Patient states, "I don't want that water. I don't want something that is already opened." RN asked patient why he no longer wants ice water as it has not been a problem in the past. Patient states, "I don't trust something that has already been opened. Put that water in the trash. Give me ginger ale." Ginger ale and cup of ice given to patient per request.

## 2022-07-04 NOTE — ED Notes (Signed)
Pt asked this tech for two more vanilla ice creams and another cranberry juice. I informed him I would have to ask his nurse first. Rachael, RN, instructed me to tell him he was to only have water if he was still thirsty. When I informed him of this he replied with "I don't want any g** damn water. Tell that bitch to kiss my ass." The nurse was given the message.

## 2022-07-04 NOTE — ED Notes (Signed)
Lorriane Shire, charge RN at bedside with patient at this time.

## 2022-07-04 NOTE — TOC Progression Note (Signed)
Transition of Care [facility]) - Progression Note    Patient Details  Name: Gary [name] MRN: [id number] Date of Birth: 27-May-1955  Transition of Care [facility]) CM/SW Contact  Ross Ludwig, [place] Phone Number: 07/04/2022, 6:26 PM  Clinical Narrative:     CSW spoke to upper leadership to discuss options for this patient.  CSW was advised to try to call [facility] again.  CSW spoke to Ai in admissions she will review patient's case again and contact this CSW in the morning after reviewing patient's situation.  TOC to continue to follow patient's progress throughout discharge planning.   Expected Discharge Plan: [place] Barriers to Discharge: ED Unsafe disposition  Expected Discharge Plan and Services   Discharge Planning Services: CM Consult Post Acute Care Choice: [place] Living arrangements for the past 2 months: Single Family Home                 DME Arranged: N/A DME Agency: NA                   Social Determinants of Health (SDOH) Interventions SDOH Screenings   Food Insecurity: No Food Insecurity (04/15/2022)  Housing: Low Risk  (04/15/2022)  Transportation Needs: Unmet Transportation Needs (04/15/2022)  Utilities: Not At Risk (04/15/2022)  Recent Concern: Utilities - At Risk (02/05/2022)  Depression (PHQ2-9): Low Risk  (06/07/2021)  Financial Resource Strain: Medium Risk (05/03/2017)  Physical Activity: Insufficiently Active (05/03/2017)  Social Connections: Moderately Integrated (05/03/2017)  Stress: No Stress Concern Present (05/03/2017)  Tobacco Use: Medium Risk (04/26/2022)    Readmission Risk Interventions    12/31/2021    8:58 AM 12/20/2021    4:21 PM 05/04/2021    1:26 PM  Readmission Risk Prevention Plan  Transportation Screening Complete Complete Complete  Medication Review Press photographer) Complete Complete Complete  PCP or Specialist appointment within 3-5 days of discharge Complete Complete Complete  HRI or Home  Care Consult Complete Complete Complete  SW Recovery Care/Counseling Consult Complete Complete Complete  Palliative Care Screening Complete Complete Not [place] Not Applicable Not Applicable Not Applicable

## 2022-07-05 NOTE — ED Notes (Signed)
This RN to bedside with morning medications, pt states "you got my mucinex and tylenol?". This RN left the room to pull prn meds as requested.

## 2022-07-05 NOTE — ED Notes (Signed)
Pt in bedside chair, attempted to motivate pt to stand on scale brought to room, pt denied stating he doesn't feel strong enough. Unable to zero bed for weight.

## 2022-07-05 NOTE — ED Provider Notes (Signed)
Emergency Medicine Observation Re-evaluation Note  Gary [name] is a 67 y.o. male, currently boarding in the emergency department awaiting placement.  No acute events since last update.  Physical Exam  BP 122/78   Pulse 80   Temp 98 F (36.7 C) (Oral)   Resp 18   Ht 6\' 3"  (1.905 m)   Wt (!) 177.4 kg   SpO2 95%   BMI 48.87 kg/m   ED Course / MDM   No recent lab work available for review  Plan  Current plan is for placement to an appropriate living facility once available.  Social worker is currently working with the patient for placement.Harvest Dark, MD 07/05/22 1958

## 2022-07-05 NOTE — ED Notes (Addendum)
Pt requested to be put on bedpan. Pt had a BM. This RN cleaned bottom and provided peri care. New chux on bed.

## 2022-07-05 NOTE — ED Notes (Signed)
Patient requested cranberry juice and ice. 3 cranberry juice and cup of ice given to patient by respiratory therapist.

## 2022-07-05 NOTE — TOC Progression Note (Signed)
Transition of Care [facility]) - Progression Note    Patient Details  Name: Gary [name] MRN: [id number] Date of Birth: 03-Dec-1955  Transition of Care [facility]) CM/SW Contact  Ross Ludwig, [place] Phone Number: 07/05/2022, 2:30pm  Clinical Narrative:     CSW spoke to [place] from [facility], she informed this CSW that patient does not have enough acuity to justify LTACH and the revenue codes would not be able to meet LTAC criteria.  Raquel Sarna said she will send the patient's information to [facility] to see if they can review patient.  She stated she is not sure if they have any beds available at this time, she though there may be a wait list.  Raquel Sarna stated she will send the referral to [facility].  TOC to continue to follow patient's progress throughout discharge planning.  Expected Discharge Plan: [place] Barriers to Discharge: ED Unsafe disposition  Expected Discharge Plan and Services   Discharge Planning Services: CM Consult Post Acute Care Choice: [place] Living arrangements for the past 2 months: Single Family Home                 DME Arranged: N/A DME Agency: NA                   Social Determinants of Health (SDOH) Interventions SDOH Screenings   Food Insecurity: No Food Insecurity (04/15/2022)  Housing: Low Risk  (04/15/2022)  Transportation Needs: Unmet Transportation Needs (04/15/2022)  Utilities: Not At Risk (04/15/2022)  Recent Concern: Utilities - At Risk (02/05/2022)  Depression (PHQ2-9): Low Risk  (06/07/2021)  Financial Resource Strain: Medium Risk (05/03/2017)  Physical Activity: Insufficiently Active (05/03/2017)  Social Connections: Moderately Integrated (05/03/2017)  Stress: No Stress Concern Present (05/03/2017)  Tobacco Use: Medium Risk (04/26/2022)    Readmission Risk Interventions    12/31/2021    8:58 AM 12/20/2021    4:21 PM 05/04/2021    1:26 PM  Readmission Risk Prevention Plan  Transportation Screening  Complete Complete Complete  Medication Review Press photographer) Complete Complete Complete  PCP or Specialist appointment within 3-5 days of discharge Complete Complete Complete  HRI or Home Care Consult Complete Complete Complete  SW Recovery Care/Counseling Consult Complete Complete Complete  Palliative Care Screening Complete Complete Not [place] Not Applicable Not Applicable Not Applicable

## 2022-07-05 NOTE — Progress Notes (Addendum)
Physical Therapy Treatment Patient Details Name: Gary [name] MRN: [id number] DOB: [birth year] Today's Date: 07/05/2022   History of Present Illness Gary [name] is a 26yoM who comes to [facility] on 04/26/21 reports his self care needs not being met after prior DC, staffing issues in home and reportedly not able to get his medications. Pt was DC from here 10 days prior after admission for respiratory distress, found to have active influenza infection, also CHF exacerbation. PMH: morbid obesity, CRF s/p tracheostomy c PMV when awake and triology vent support at when sleeping, HTN, COPD    PT Comments    The patient continues to require specialty bariatric equipment to raise the standing surface of bed and recliner to a height of around 30 inches to be able to stand. No physical lifting assistance is required from standing from this height. Chair follow for safety for 2 bouts of ambulation of 40 ft using rolling walker with supervision for safety. Seated rest break between bouts of walking due to fatigue. The patient was seated in recliner at end of session which is recommended routinely to promote upright conditioning and strength. PT will continue to follow to maximize independence and decrease caregiver burden.    Recommendations for follow up therapy are one component of a multi-disciplinary discharge planning process, led by the attending physician.  Recommendations may be updated based on patient status, additional functional criteria and insurance authorization.  Follow Up Recommendations  Acute inpatient rehab (3hours/day) Can patient physically be transported by private vehicle: No   Assistance Recommended at Discharge Intermittent Supervision/Assistance  Patient can return home with the following Assist for transportation;Help with stairs or ramp for entrance;A lot of help with walking and/or transfers;A little help with bathing/dressing/bathroom;Assistance with Paramedic (measurements PT);BSC/3in1;Hospital bed;Wheelchair cushion (measurements PT);Rolling walker (2 wheels) (recommended if going home)    Recommendations for Other Services       Precautions / Restrictions Precautions Precautions: Fall Restrictions Weight Bearing Restrictions: No     Mobility  Bed Mobility Overal bed mobility: Needs Assistance Bed Mobility: Supine to Sit     Supine to sit: Min assist     General bed mobility comments: trunk and LLE assistance provided. cues for technique    Transfers Overall transfer level: Needs assistance Equipment used: Rolling walker (2 wheels) Transfers: Sit to/from Stand Sit to Stand: Min guard           General transfer comment: bed and chair height elevated to around 30 inches. cues for technique and task initiation. 2 bouts of standing performed    Ambulation/Gait Ambulation/Gait assistance: Supervision Gait Distance (Feet): 40 Feet (x 2 bouts) Assistive device: Rolling walker (2 wheels)   Gait velocity: decreased     General Gait Details: chair follow for safety. no physical assistance required. occasional cues for safety and for rest breaks as needed. activity tolerance limited by fatigue   Stairs             Wheelchair Mobility    Modified Rankin (Stroke Patients Only)       Balance Overall balance assessment: Needs assistance Sitting-balance support: Feet supported Sitting balance-[name] Scale: Normal     Standing balance support: Bilateral upper extremity supported Standing balance-[name] Scale: Fair                              Cognition Arousal/Alertness: Awake/alert Behavior During Therapy: WFL for tasks assessed/performed Overall Cognitive  Status: Within Functional Limits for tasks assessed                                          Exercises      General Comments        Pertinent Vitals/Pain Pain Assessment Pain Assessment:  No/denies pain    Home Living                          Prior Function            PT Goals (current goals can now be found in the care plan section) Acute Rehab PT Goals Patient Stated Goal: to walk and go home PT Goal Formulation: With patient Time For Goal Achievement: 07/19/22 Potential to Achieve Goals: Fair Progress towards PT goals: Progressing toward goals    Frequency    Min 2X/week      PT Plan Current plan remains appropriate (will extend care plan x 2 weeks, goals still appropriate)    Co-evaluation              AM-PAC PT "6 Clicks" Mobility   Outcome Measure  Help needed turning from your back to your side while in a flat bed without using bedrails?: A Lot Help needed moving from lying on your back to sitting on the side of a flat bed without using bedrails?: A Lot Help needed moving to and from a bed to a chair (including a wheelchair)?: A Lot Help needed standing up from a chair using your arms (e.g., wheelchair or bedside chair)?: Total Help needed to walk in hospital room?: Total Help needed climbing 3-5 steps with a railing? : Total 6 Click Score: 9    End of Session Equipment Utilized During Treatment: Oxygen Activity Tolerance: Patient tolerated treatment well Patient left: in chair;with call bell/phone within reach Nurse Communication: Mobility status (discussed how to assist patient back to bed using the rolling walker) PT Visit Diagnosis: Unsteadiness on feet (R26.81);Muscle weakness (generalized) (M62.81);Difficulty in walking, not elsewhere classified (R26.2)     Time: [id number] PT Time Calculation (min) (ACUTE ONLY): 45 min  Charges:  $Gait Training: 8-22 mins $Therapeutic Activity: 23-37 mins                    [name], PT, MPT    [name] 07/05/2022, 3:47 PM

## 2022-07-05 NOTE — ED Notes (Signed)
External male catheter dislodged and patient incontinent of large amount of urine in bed. Linens and chux changed. Pericare performed. Gown changed and new external male catheter applied. No distress noted. Patient positioned for comfort.

## 2022-07-05 NOTE — ED Notes (Signed)
Patient resting in bed with eyes closed Resp even, unlabored. No distress noted at this time.  

## 2022-07-05 NOTE — Progress Notes (Addendum)
Brief Nutrition Note  Received secure chat from RN. She reports that last night, pt was demanding additional nourishments (ice cream, juice, and soda) from nursing staff. Pt is now refusing water.   Pt has been boarding in ED for 69 days and is a difficult placement. Noted that pt's size is a barrier to pt placement. Last measured wt was 2 months ago. Wt reading has been ordered for today.   Spoke with pt at bedside. He reports feeling better and again voices desire to continue to make progress with therapy. He states he is excited to walk and also talks about his goals to eventually walk long enough to be able to go outside. He also reports he has been using resistance bands throughout the day for additional upper arm and leg exercises per instructions from acute rehab team.   Reviewed heart healthy, carb modified diet with pt. RD asked pt if he was consuming food other than what was provided on his trays. Pt reports he "sometimes asks for juice and ice cream, but I don't ask for sandwiches anymore". RD discussed with pt how strict compliance with his prescribed hospital diet will assist him in attaining his goals. RD recommended that pt consume only foods that are provided to him off hospital meal trays. RD praised pt for his efforts and progress that he has made thus far, but that ultimately it is his responsibility to follow his treatment plan in order to receive the maximum benefit of all interventions ordered for him.   RD discussed above findings with RN.   Heart healthy, carb modified diet remains the most appropriate diet and treatment plan for pt. Nutritional services department will continue to provide pt with meals within restrictions of prescribed diet, however, RD is unable to monitor foods and liquids provided to pt outside of meal trays.   No further recommendations or interventions warranted at this time. If further nutrition issues arise, please re-consult RD.   Loistine Chance, RD, LDN,  [place] Registered Dietitian II Certified Diabetes Care and Education Specialist Please refer to [facility] for RD and/or RD on-call/weekend/after hours pager

## 2022-07-06 NOTE — ED Notes (Addendum)
Patient resting in bed, no distress noted

## 2022-07-06 NOTE — ED Provider Notes (Signed)
-----------------------------------------   6:17 AM on 07/06/2022 -----------------------------------------   Blood pressure 122/78, pulse 80, temperature 98 F (36.7 C), temperature source Oral, resp. rate 18, height 1.905 m (6\' 3" ), weight (!) 177.4 kg, SpO2 95 %.  The patient is calm and cooperative at this time.  There have been no acute events since the last update.  Awaiting disposition plan from [facility] team.   Hinda Kehr, MD 07/06/22 [phone]

## 2022-07-07 NOTE — Progress Notes (Signed)
Mobility Specialist - Progress Note   07/07/22 1619  Mobility  Activity Ambulated with assistance in hallway;Stood at bedside;Dangled on edge of bed  Level of Assistance Contact guard assist, steadying assist  Assistive Device Front wheel walker  Distance Ambulated (ft) 80 ft  Activity Response Tolerated well  Mobility Referral Yes  $Mobility charge 1 Mobility   Pt supine in bed on Trach upon arrival. Pt needs ModA for bed mobility and MaxA to don shoes. Pt STS from a bed height of 29.5 and ambulates CGA-SBA +2 for chair follow. Pt takes 3 seated rest breaks throughout ambulation at 25ft, 38ft, and 15 ft. Pt returns to bed with needs in reach.   Gretchen Short  Mobility Specialist  07/07/22 4:25 PM

## 2022-07-07 NOTE — ED Notes (Signed)
Pt stating his purewick is leaking. Chucks replaced, new purewick placed. Pt has no other needs at this time.

## 2022-07-07 NOTE — Progress Notes (Signed)
Patient refused home ventilator for HS. Patient denies need for respiratory care or assistance at this time. Stable on 28% ATM.

## 2022-07-07 NOTE — Progress Notes (Signed)
Assisted patient with placing home ventilator.  Patient denies trach care at this time.

## 2022-07-08 NOTE — ED Notes (Signed)
Pt had bowel movement. Pt cleaned up by Verdis Frederickson NT.

## 2022-07-08 NOTE — ED Notes (Signed)
Pt given 2 cranberry juices per request.

## 2022-07-08 NOTE — ED Notes (Signed)
This RN introduce myself to pt as his nurse for the night, pt denied any needs currently. Call light within reach and pt up eating dinner/snack.

## 2022-07-08 NOTE — Progress Notes (Signed)
Patient transitioned off of Home Ventilator, back to TC 5L 28%. Inner cannula and gauze changed, patient declined any additional trach care at this time.

## 2022-07-08 NOTE — ED Notes (Signed)
NT stephanie checked on pt. Pt denies any needs at this time.

## 2022-07-08 NOTE — ED Notes (Signed)
This Probation officer into room after pt called out requesting that monitor be turned off for sleep promotion.  This Probation officer obliged, and pt states "thank you."  Pt denies other needs at this time.

## 2022-07-08 NOTE — ED Notes (Signed)
Pt given 2 ice creams, cranberry juice, and ice water. Pt cooperative with allowing his vitals to be taken, when RN returned with pt requested snacks pt demands RN to remove his BP cuff. This RN was told from prior shift RN, that pt is to remove his own vitals monitoring and clean himself up - a plan that was created with management and the pt. Pt informed and removed his own BP cuff and threw it on the floor next to RN. RN told pt that was disrespectful and I am just following the rules that were created prior shift. This RN got pt his other requested snacks and told pt that lets be respectful to each other. Pt said thank you upon RN leaving room and door left cracked per pt request. Pt denied other needs at this time.

## 2022-07-08 NOTE — ED Notes (Addendum)
Pt stated to RN : " I can't wait till your not my nurse anymore, you only want to come in here when it benefits youself, aint nobody wanna come in here to see how I'm doing." "Night shift nurses come in here and talk to me and you dont, you only come in when I press the call bell, get my vitals, or give me pills." " I can't stand when your my nurse." "Nobody wants to give me baths" " the black girls always come in here to talk to me but you white girls wont."  Pt made aware that techs often answer call bells, give bed baths, and take vitals. Pt made aware that if it is a nurse related issue I will be made aware or the doctor. Pt made aware he is in an emergency room and he needs to be as independent as possible, as we are trying to get him to do ADL's by himself in order to ready him for DC." Pt ripped off vital cords and thew onto ground.

## 2022-07-08 NOTE — ED Provider Notes (Signed)
-----------------------------------------   5:14 AM on 07/08/2022 -----------------------------------------   Blood pressure 121/79, pulse 77, temperature 97.9 F (36.6 C), temperature source Oral, resp. rate 20, height 6\' 3"  (1.905 m), weight (!) 177.4 kg, SpO2 94 %.  The patient is calm and cooperative at this time.  There have been no acute events since the last update.  Awaiting disposition plan from case management/social work.    Sherrel Shafer, Delice Bison, DO 07/08/22 [phone]

## 2022-07-08 NOTE — ED Notes (Signed)
Pt yelled out "hey nurse x 2" as RN was wheeling pt out via wheelchair. Paramedic Romilda Joy went to check on pt, pt states he was "all good".

## 2022-07-09 LAB — CBC
HCT: 39.3 % (ref 39.0–52.0)
Hemoglobin: 10.5 g/dL — ABNORMAL LOW (ref 13.0–17.0)
MCH: 21.5 pg — ABNORMAL LOW (ref 26.0–34.0)
MCHC: 26.7 g/dL — ABNORMAL LOW (ref 30.0–36.0)
MCV: 80.4 fL (ref 80.0–100.0)
Platelets: 241 10*3/uL (ref 150–400)
RBC: 4.89 MIL/uL (ref 4.22–5.81)
RDW: 16.8 % — ABNORMAL HIGH (ref 11.5–15.5)
WBC: 5.9 10*3/uL (ref 4.0–10.5)
nRBC: 0.3 % — ABNORMAL HIGH (ref 0.0–0.2)

## 2022-07-09 NOTE — ED Notes (Signed)
Pt called out, requesting to see his nurse.  When asked what pt needs, pt states "Give me 2 ice creams and a cup of ice."  This writer obliged, and gave pt his PM meds.  Pt also given a blanket per request.  Pt denies any further needs.  Call light within reach.

## 2022-07-09 NOTE — ED Notes (Signed)
This RN took wash basin, soap, deodorant, lotion, tooth brush/tooth paste, rags, into pt room. Pt notified that these supplies were for his bed bath. RN explained to pt that he is supposed to attempt to clean himself and the areas he can't reach/clean we will clean for him. Pt immediately started yelling at RN racist slurs stating that I did not want to clean him because of his race and someone else will do it and I'm just saying "shit out my mouth". RN explained these rules are from management and pt is supposed to complete as many ADL's on own as possible, as the goal is for future DC. Pt began to yell stating " get out of my room, shut your mouth." Pt then threw wash rag in direction of RN and tried to recant stating "I was aiming for the wash basin." Pt told he is not allowed to throw items as it is not tolerated. Pt states " I do not care, you need to shut your mouth." Pt continues to agrue with this RN stating comments that "your boyfriend should whoop your ass." Security at bedside and heard comments. Security attempted to re-direct pt verbally. Pt did state "I did not mean that comment." Pt failed to apologize. Pt made aware that a male RN will be completing the rest of his bed bath. Pt states "I don't want no male touching me, get another male in here that'll actually do something." Pt also stated that the RN's here are in on a big plan of fake documenting to make him look bad. Pt states " I hope one day you pay for all your lies." RN walked out of room to switch with male RN.

## 2022-07-09 NOTE — ED Notes (Signed)
Lab notified to obtain blood work. Pt is hard stick.

## 2022-07-09 NOTE — ED Notes (Signed)
Pt called out, requesting that his urine cannister be emptied.  This Probation officer obliged.  1200 cc of urine emptied from cannister into toilet.  Pt denies further needs at this time.

## 2022-07-09 NOTE — ED Notes (Signed)
RN Ermalinda Barrios asked EDT Liley Rake to come into room 3 to assist with tasks that needed to be done. RN Ermalinda Barrios was telling patient that their has been some rules and was telling him what the rules were. Patient started yelling at staff, cussing and being rude. EDT Tramel Westbrook told patient that he needs to stop being rude to staff. Patient states "I can't wash my legs, I can't was my bottom while laying on my back". RN Ermalinda Barrios was telling patient what you can not get we will get the rest". Patient was constantly saying " GET THE [place]". I don't want the RN in here, I want the EDT in here and we was telling him we are here to help him on what you can not get. Patient said a racist slurred telling the nurse that she does not want to bathe him because he is black and etc. Patient tossed his wash rag into the basin as it got onto the RN. EDT went to report it to charge nurse RN Levada Dy.

## 2022-07-09 NOTE — ED Notes (Signed)
This RN at charge nurse desk, pt could be heard raising voice at staff. Pt telling RN Ermalinda Barrios "be quiet, you talking too much". [place] EDT also at bedside. Ermalinda Barrios RN went to speak with leadership, pt could still be heard raising his voice at EDT [facility]. Charge RN called security to be at bedside for safety of staff.

## 2022-07-09 NOTE — ED Notes (Signed)
New male purewick placed on patient at this time. Full linen and gown change.

## 2022-07-09 NOTE — Progress Notes (Signed)
Physical Therapy Treatment Patient Details Name: Gary [name] MRN: [id number] DOB: [birth year] Today's Date: 07/09/2022   History of Present Illness Gary [name] is a 80yoM who comes to [facility] on 04/26/21 reports his self care needs not being met after prior DC, staffing issues in home and reportedly not able to get his medications. Pt was DC from here 10 days prior after admission for respiratory distress, found to have active influenza infection, also CHF exacerbation. PMH: morbid obesity, CRF s/p tracheostomy c PMV when awake and triology vent support at when sleeping, HTN, COPD   PT Comments    Patient is agreeable to PT and is making progress towards meeting PT goals. He was calm and cooperative throughout session. He continues to require minimal assistance for bed mobility. 2 bouts of standing performed, once from bed and once from recliner chair. Patient continues to require elevated surface to stand and could benefit from specialty equipment at discharge. He walked 2 bouts of 6ft using rolling walker with chair follow for safety. Activity tolerance limited by fatigue. Anticipate the need for intermittent supervision/assistance and continued PT recommended at discharge.    Recommendations for follow up therapy are one component of a multi-disciplinary discharge planning process, led by the attending physician.  Recommendations may be updated based on patient status, additional functional criteria and insurance authorization.  Follow Up Recommendations  Can patient physically be transported by private vehicle: No    Assistance Recommended at Discharge Intermittent Supervision/Assistance  Patient can return home with the following Assist for transportation;Help with stairs or ramp for entrance;A lot of help with walking and/or transfers;A little help with bathing/dressing/bathroom;Assistance with Education officer, environmental (measurements PT);BSC/3in1;Hospital  bed;Wheelchair cushion (measurements PT);Rolling walker (2 wheels) (custom wheelchair)    Recommendations for Other Services       Precautions / Restrictions Precautions Precautions: Fall Restrictions Weight Bearing Restrictions: No     Mobility  Bed Mobility Overal bed mobility: Needs Assistance Bed Mobility: Supine to Sit     Supine to sit: Min assist     General bed mobility comments: cues for technique. assistance for trunk support and occasionally for LE    Transfers Overall transfer level: Needs assistance Equipment used: Rolling walker (2 wheels) Transfers: Sit to/from Stand Sit to Stand: Min guard           General transfer comment: bed and chair height elevated to around 30 inches. cues for technique and task initiation, anterior weight shifting. 2 bouts of standing performed    Ambulation/Gait Ambulation/Gait assistance: Supervision Gait Distance (Feet): 55 Feet (x 2 bouts) Assistive device: Rolling walker (2 wheels)   Gait velocity: decreased     General Gait Details: chair follow for safety. no physical assistance required. occasional cues for safety and for rest breaks as needed. patient demonstrates reciprocal giat. activity tolerance limited by fatigue   Stairs             Wheelchair Mobility    Modified Rankin (Stroke Patients Only)       Balance Overall balance assessment: Needs assistance Sitting-balance support: Feet supported Sitting balance-[name] Scale: Normal     Standing balance support: Bilateral upper extremity supported Standing balance-[name] Scale: Fair                              Cognition Arousal/Alertness: Awake/alert Behavior During Therapy: WFL for tasks assessed/performed Overall Cognitive Status: Within Functional Limits for tasks assessed  Exercises      General Comments        Pertinent Vitals/Pain Pain Assessment Pain  Assessment: No/denies pain    Home Living                          Prior Function            PT Goals (current goals can now be found in the care plan section) Acute Rehab PT Goals Patient Stated Goal: to walk and go home PT Goal Formulation: With patient Time For Goal Achievement: 07/19/22 Potential to Achieve Goals: Fair Progress towards PT goals: Progressing toward goals    Frequency    Min 2X/week      PT Plan Current plan remains appropriate    Co-evaluation              AM-PAC PT "6 Clicks" Mobility   Outcome Measure  Help needed turning from your back to your side while in a flat bed without using bedrails?: A Lot Help needed moving from lying on your back to sitting on the side of a flat bed without using bedrails?: A Lot Help needed moving to and from a bed to a chair (including a wheelchair)?: A Lot Help needed standing up from a chair using your arms (e.g., wheelchair or bedside chair)?: Total Help needed to walk in hospital room?: Total Help needed climbing 3-5 steps with a railing? : Total 6 Click Score: 9    End of Session Equipment Utilized During Treatment: Oxygen Activity Tolerance: Patient tolerated treatment well Patient left: in bed;with call bell/phone within reach         Time: 1347-1430 PT Time Calculation (min) (ACUTE ONLY): 43 min  Charges:  $Gait Training: 8-22 mins $Therapeutic Activity: 23-37 mins                     [name], PT, MPT    [name] 07/09/2022, 2:45 PM

## 2022-07-09 NOTE — Progress Notes (Addendum)
Had been asked by staff to talk with patient about inappropriate behavior and refusing to cleanse genital area and throwing a wash cloth at an Therapist, sports. Introduced myself. Explained to patient it was unacceptable to be sexually suggestive to staff or to be verbally abusive, and he needed to wash his genitalia. Patient states none of that is true and he is incapable of cleaning his own genitalia, and if I didn't believe him I could ask physical therapy, states I can't even walk, how could I wash down there. Again emphasized patient needed to attempt all self care activities as possible to maintain independence.

## 2022-07-09 NOTE — ED Notes (Signed)
Pt able to roll from back to left side and back to right side.

## 2022-07-09 NOTE — ED Notes (Signed)
Pt refused vitals at this time.

## 2022-07-09 NOTE — ED Notes (Signed)
Pt asked if he needed anything and pt denies needs at this time. Pt resting comfortably, call light within reach

## 2022-07-09 NOTE — ED Notes (Addendum)
This RN called Jacobo Forest and made her aware that pt is being verbally aggressive with staff and reportedly threw wash rag at primary RN. Security is currently at bedside and plan made to change staffing assignment for RN safety.

## 2022-07-09 NOTE — ED Notes (Signed)
This RN was walking by patients room. Pt called for RN to come into his room. RN entered room and patient stated that he wanted to apologize to this RN for the way he spoke yesterday. Pt stated that he was upset but he should not have spoken to this RN the way that he did. This RN told patient that his apology was appreciated and that we wanted to provide care for the patient but we had to be respectful to each other while he was here. Patient verbalized understanding. This RN removed pts dirty meal tray from bedside table. Patient denied any other needs at this time.

## 2022-07-10 ENCOUNTER — Emergency Department: Payer: Medicare Other

## 2022-07-10 DIAGNOSIS — G4733 Obstructive sleep apnea (adult) (pediatric): Secondary | ICD-10-CM | POA: Diagnosis not present

## 2022-07-10 DIAGNOSIS — I5032 Chronic diastolic (congestive) heart failure: Secondary | ICD-10-CM

## 2022-07-10 DIAGNOSIS — J9621 Acute and chronic respiratory failure with hypoxia: Secondary | ICD-10-CM | POA: Diagnosis present

## 2022-07-10 LAB — CBC WITH DIFFERENTIAL/PLATELET
Abs Immature Granulocytes: 0.03 10*3/uL (ref 0.00–0.07)
Basophils Absolute: 0 10*3/uL (ref 0.0–0.1)
Basophils Relative: 1 %
Eosinophils Absolute: 0.2 10*3/uL (ref 0.0–0.5)
Eosinophils Relative: 3 %
HCT: 37.8 % — ABNORMAL LOW (ref 39.0–52.0)
Hemoglobin: 10.2 g/dL — ABNORMAL LOW (ref 13.0–17.0)
Immature Granulocytes: 0 %
Lymphocytes Relative: 19 %
Lymphs Abs: 1.3 10*3/uL (ref 0.7–4.0)
MCH: 21.3 pg — ABNORMAL LOW (ref 26.0–34.0)
MCHC: 27 g/dL — ABNORMAL LOW (ref 30.0–36.0)
MCV: 78.8 fL — ABNORMAL LOW (ref 80.0–100.0)
Monocytes Absolute: 0.7 10*3/uL (ref 0.1–1.0)
Monocytes Relative: 10 %
Neutro Abs: 4.6 10*3/uL (ref 1.7–7.7)
Neutrophils Relative %: 67 %
Platelets: 252 10*3/uL (ref 150–400)
RBC: 4.8 MIL/uL (ref 4.22–5.81)
RDW: 16.8 % — ABNORMAL HIGH (ref 11.5–15.5)
WBC: 6.8 10*3/uL (ref 4.0–10.5)
nRBC: 0.4 % — ABNORMAL HIGH (ref 0.0–0.2)

## 2022-07-10 LAB — RESP PANEL BY RT-PCR (RSV, FLU A&B, COVID)  RVPGX2
Influenza A by PCR: NEGATIVE
Influenza B by PCR: NEGATIVE
Resp Syncytial Virus by PCR: NEGATIVE
SARS Coronavirus 2 by RT PCR: NEGATIVE

## 2022-07-10 LAB — BRAIN NATRIURETIC PEPTIDE: B Natriuretic Peptide: 17.5 pg/mL (ref 0.0–100.0)

## 2022-07-10 LAB — COMPREHENSIVE METABOLIC PANEL
ALT: 11 U/L (ref 0–44)
AST: 14 U/L — ABNORMAL LOW (ref 15–41)
Albumin: 3 g/dL — ABNORMAL LOW (ref 3.5–5.0)
Alkaline Phosphatase: 104 U/L (ref 38–126)
Anion gap: 5 (ref 5–15)
BUN: 28 mg/dL — ABNORMAL HIGH (ref 8–23)
CO2: 32 mmol/L (ref 22–32)
Calcium: 8.2 mg/dL — ABNORMAL LOW (ref 8.9–10.3)
Chloride: 100 mmol/L (ref 98–111)
Creatinine, Ser: 1.38 mg/dL — ABNORMAL HIGH (ref 0.61–1.24)
GFR, Estimated: 57 mL/min — ABNORMAL LOW (ref >60)
Glucose, Bld: 118 mg/dL — ABNORMAL HIGH (ref 70–99)
Potassium: 3.5 mmol/L (ref 3.5–5.1)
Sodium: 137 mmol/L (ref 135–145)
Total Bilirubin: 0.4 mg/dL (ref 0.3–1.2)
Total Protein: 7.9 g/dL (ref 6.5–8.1)

## 2022-07-10 LAB — LACTIC ACID, PLASMA: Lactic Acid, Venous: 1.7 mmol/L (ref 0.5–1.9)

## 2022-07-10 LAB — TROPONIN I (HIGH SENSITIVITY): Troponin I (High Sensitivity): 5 ng/L (ref <18)

## 2022-07-10 LAB — PROCALCITONIN: Procalcitonin: <0.1 ng/mL

## 2022-07-10 MED ORDER — IOHEXOL 350 MG/ML SOLN
100.0000 mL | Freq: Once | INTRAVENOUS | Status: AC | PRN
  Administered 2022-07-10: 100 mL via INTRAVENOUS

## 2022-07-10 MED ORDER — IOHEXOL 300 MG/ML  SOLN: 100.0000 mL | Freq: Once | INTRAMUSCULAR | Status: DC | PRN

## 2022-07-10 MED ORDER — IPRATROPIUM-ALBUTEROL 0.5-2.5 (3) MG/3ML IN SOLN
3.0000 mL | Freq: Four times a day (QID) | RESPIRATORY_TRACT | Status: DC
  Filled 2022-07-10: qty 3

## 2022-07-10 MED ORDER — IPRATROPIUM-ALBUTEROL 0.5-2.5 (3) MG/3ML IN SOLN
3.0000 mL | Freq: Once | RESPIRATORY_TRACT | Status: AC
  Administered 2022-07-10: 3 mL via RESPIRATORY_TRACT
  Filled 2022-07-10: qty 3

## 2022-07-10 MED ORDER — ACETAMINOPHEN 325 MG PO TABS
650.0000 mg | ORAL_TABLET | Freq: Four times a day (QID) | ORAL | Status: DC | PRN
  Administered 2022-07-10 – 2022-07-25 (×13): 650 mg via ORAL
  Filled 2022-07-10 (×14): qty 2

## 2022-07-10 MED ORDER — ONDANSETRON HCL 4 MG/2ML IJ SOLN: 4.0000 mg | Freq: Four times a day (QID) | INTRAMUSCULAR | Status: DC | PRN

## 2022-07-10 MED ORDER — ACETAMINOPHEN 650 MG RE SUPP: 650.0000 mg | Freq: Four times a day (QID) | RECTAL | Status: DC | PRN

## 2022-07-10 MED ORDER — FUROSEMIDE 10 MG/ML IJ SOLN
40.0000 mg | Freq: Once | INTRAMUSCULAR | Status: AC
  Administered 2022-07-10: 40 mg via INTRAVENOUS
  Filled 2022-07-10: qty 4

## 2022-07-10 MED ORDER — SODIUM CHLORIDE 0.9% FLUSH
3.0000 mL | Freq: Two times a day (BID) | INTRAVENOUS | Status: DC
  Administered 2022-07-10 – 2022-07-19 (×15): 3 mL via INTRAVENOUS

## 2022-07-10 MED ORDER — GUAIFENESIN ER 600 MG PO TB12
600.0000 mg | ORAL_TABLET | Freq: Two times a day (BID) | ORAL | Status: DC
  Administered 2022-07-10 – 2022-07-26 (×32): 600 mg via ORAL
  Filled 2022-07-10 (×32): qty 1

## 2022-07-10 MED ORDER — ONDANSETRON HCL 4 MG PO TABS: 4.0000 mg | ORAL_TABLET | Freq: Four times a day (QID) | ORAL | Status: DC | PRN

## 2022-07-10 MED ORDER — PREDNISONE 20 MG PO TABS
40.0000 mg | ORAL_TABLET | Freq: Every day | ORAL | Status: AC
  Administered 2022-07-10 – 2022-07-14 (×5): 40 mg via ORAL
  Filled 2022-07-10 (×5): qty 2

## 2022-07-10 MED ORDER — TORSEMIDE 20 MG PO TABS
20.0000 mg | ORAL_TABLET | Freq: Two times a day (BID) | ORAL | Status: DC
  Administered 2022-07-10 – 2022-07-26 (×32): 20 mg via ORAL
  Filled 2022-07-10 (×32): qty 1

## 2022-07-10 NOTE — ED Notes (Addendum)
This writer went into pts room to check VS.  On assessment, pt noted to be hypoxic on his normal O2 use.  RT came to bedside and  assessed pt and suctioned pt/changed trach cuff.  MD Beather Arbour aware of findings.  Pt's wave form and hypoxia seen by this RN, Lorriane Shire, RN, Angelina Sheriff, RN, and MD Beather Arbour.  New orders placed accordingly.     Pt's O2 sats noted to be 85% after interventions.

## 2022-07-10 NOTE — Assessment & Plan Note (Signed)
Renal function currently at baseline.   - Continue to monitor intermittently while admitted

## 2022-07-10 NOTE — TOC Progression Note (Signed)
Transition of Care [facility] - Progression Note    Patient Details  Name: Gary [name] MRN: [id number] Date of Birth: [birth year]  Transition of Care [facility]) CM/SW Contact  Ross Ludwig, [place] Phone Number: 07/10/2022, 4:37 PM  Clinical Narrative:     CSW attempted to contact Jorje Guild from [place] facilities to see if they can review patient.  CSW left a voice mail, awaiting for a call back.   Expected Discharge Plan: [place] Barriers to Discharge: ED Unsafe disposition  Expected Discharge Plan and Services   Discharge Planning Services: CM Consult Post Acute Care Choice: [place] Living arrangements for the past 2 months: Single Family Home                 DME Arranged: N/A DME Agency: NA                   Social Determinants of Health (SDOH) Interventions SDOH Screenings   Food Insecurity: No Food Insecurity (04/15/2022)  Housing: Low Risk  (04/15/2022)  Transportation Needs: Unmet Transportation Needs (04/15/2022)  Utilities: Not At Risk (04/15/2022)  Recent Concern: Utilities - At Risk (02/05/2022)  Depression (PHQ2-9): Low Risk  (06/07/2021)  Financial Resource Strain: Medium Risk (05/03/2017)  Physical Activity: Insufficiently Active (05/03/2017)  Social Connections: Moderately Integrated (05/03/2017)  Stress: No Stress Concern Present (05/03/2017)  Tobacco Use: Medium Risk (04/26/2022)    Readmission Risk Interventions    12/31/2021    8:58 AM 12/20/2021    4:21 PM 05/04/2021    1:26 PM  Readmission Risk Prevention Plan  Transportation Screening Complete Complete Complete  Medication Review Press photographer) Complete Complete Complete  PCP or Specialist appointment within 3-5 days of discharge Complete Complete Complete  HRI or Home Care Consult Complete Complete Complete  SW Recovery Care/Counseling Consult Complete Complete Complete  Palliative Care Screening Complete Complete Not [employer]  Not Applicable Not Applicable Not Applicable

## 2022-07-10 NOTE — Assessment & Plan Note (Signed)
-   Continue home SSRI and Atarax

## 2022-07-10 NOTE — ED Provider Notes (Signed)
Patient care assumed at 7 AM. Briefly, pt is a 67 yo M with extensive PMHx including morbid obesity, GERD, HLD, chronic resp failure s/p tracheostomy, COPD, hypertension, here with inability to care for self needing placement. He has been boarding in ED for a prolonged amount of time and while waiting, became hypoxic yesterday. CT pending. Labs re-sent.  CT Angio shows no PE. Pt remains hypoxic.Suspect possible CHF. Will admit to hospitalist service for acute on chronic hypoxic resp failure.   Duffy Bruce, MD 07/10/22 1950

## 2022-07-10 NOTE — Progress Notes (Signed)
Informed RN that pt needs to be on continuous pulseox while on vent

## 2022-07-10 NOTE — Assessment & Plan Note (Addendum)
Early this morning, patient was noted to be hypoxic as low as in the 80s and was subsequently placed on 4 L of supplemental oxygen.  Patient previously required 4 to 5 L of supplemental oxygen, but in the last 75 days he has been in the ER, he has not used this and not required it.  Given bronchitis seen on CT imaging and increased secretions, most consistent with COPD exacerbation.  Not completely consistent with a severe COPD exacerbation, likely patient has very poor reserves  - Continue supplemental oxygen via trach collar to maintain oxygen saturation above 88%  - Wean as tolerated - Frequent suctioning - Start prednisone 40 mg to complete a 5-day course - Procalcitonin pending - Will start ceftriaxone and azithromycin.  Discontinue procalcitonin is negative - DuoNebs every 6 hours - Continue home bronchodilators

## 2022-07-10 NOTE — ED Provider Notes (Addendum)
-----------------------------------------   6:35 AM on 07/10/2022 -----------------------------------------   Patient noted to be hypoxic this morning; consistently in the mid 80%s.  Will obtain sepsis workup, EKG, chest x-ray.   ----------------------------------------- 6:57 AM on 07/10/2022 -----------------------------------------   Care transferred to oncoming provider at change of shift pending results and likely admission.    Paulette Blanch, MD 07/10/22 [phone]
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
## 2022-07-10 NOTE — ED Notes (Signed)
Called phlebotomy for lab draw 

## 2022-07-10 NOTE — Progress Notes (Signed)
Pt placed on home vent with 3 lpm O2 bleedin

## 2022-07-10 NOTE — Progress Notes (Signed)
Occupational Therapy Treatment Patient Details Name: Gary [name] MRN: [id number] DOB: October 05, 1955 Today's Date: 07/10/2022   History of present illness Gary [name] is a 50yoM who comes to [facility] on 04/26/21 reports his self care needs not being met after prior DC, staffing issues in home and reportedly not able to get his medications. Pt was DC from here 10 days prior after admission for respiratory distress, found to have active influenza infection, also CHF exacerbation. PMH: morbid obesity, CRF s/p tracheostomy c PMV when awake and triology vent support at when sleeping, HTN, COPD   OT comments  [name] was seen for OT treatment on this date. Upon arrival to room pt reclined in bed, agreeable to tx. Pt requires MAX A pericare standing. CGA + RW functional mobility for ~100 ft + ~55 ft. Pt making good progress toward goals, will continue to follow POC. Discharge recommendation remains appropriate.     Recommendations for follow up therapy are one component of a multi-disciplinary discharge planning process, led by the attending physician.  Recommendations may be updated based on patient status, additional functional criteria and insurance authorization.    Assistance Recommended at Discharge Frequent or constant Supervision/Assistance  Patient can return home with the following  Two people to help with walking and/or transfers;Two people to help with bathing/dressing/bathroom;Assistance with cooking/housework;Help with stairs or ramp for entrance;Assist for transportation   Equipment Recommendations  Hospital bed    Recommendations for Other Services      Precautions / Restrictions Precautions Precautions: Fall Restrictions Weight Bearing Restrictions: No       Mobility Bed Mobility Overal bed mobility: Needs Assistance Bed Mobility: Supine to Sit     Supine to sit: Min assist     General bed mobility comments: cues for technique. assistance for trunk support and  occasionally for LE    Transfers Overall transfer level: Needs assistance Equipment used: Rolling walker (2 wheels) Transfers: Sit to/from Stand Sit to Stand: Min guard           General transfer comment: bed and chair height elevated to around 30 inches. cues for technique and task initiation, anterior weight shifting. 2 bouts of standing performed     Balance Overall balance assessment: Needs assistance Sitting-balance support: Feet supported Sitting balance-[name] Scale: Normal     Standing balance support: Bilateral upper extremity supported Standing balance-[name] Scale: Fair Standing balance comment: heavy reliance on rolling walker for support in standing                           ADL either performed or assessed with clinical judgement   ADL Overall ADL's : Needs assistance/impaired                                       General ADL Comments: MAX A pericare standing. CGA + RW functional mobility for ~100 ft      Cognition Arousal/Alertness: Awake/alert Behavior During Therapy: WFL for tasks assessed/performed Overall Cognitive Status: Within Functional Limits for tasks assessed                                                General Comments noted patient with acute respiratory issues this morning. negative for PE and DVT  per reports.    Pertinent Vitals/ Pain       Pain Assessment Pain Assessment: No/denies pain   Frequency  Min 2X/week        Progress Toward Goals  OT Goals(current goals can now be found in the care plan section)  Progress towards OT goals: Progressing toward goals  Acute Rehab OT Goals Patient Stated Goal: go home OT Goal Formulation: With patient Time For Goal Achievement: 07/17/22 Potential to Achieve Goals: Fair ADL Goals Pt Will Perform Grooming: sitting;with modified independence Pt Will Perform Lower Body Dressing: with min assist;sitting/lateral leans;with adaptive  equipment Pt Will Transfer to Toilet: bedside commode;stand pivot transfer;with min assist;with +2 assist Pt/caregiver will Perform Home Exercise Program: Increased strength;Both right and left upper extremity;With theraband;With written HEP provided;Independently  Plan Discharge plan remains appropriate;Frequency remains appropriate    Co-evaluation      Reason for Co-Treatment: For patient/therapist safety;To address functional/ADL transfers PT goals addressed during session: Mobility/safety with mobility OT goals addressed during session: ADL's and self-care      AM-PAC OT "6 Clicks" Daily Activity     Outcome Measure   Help from another person eating meals?: None Help from another person taking care of personal grooming?: A Little Help from another person toileting, which includes using toliet, bedpan, or urinal?: A Lot Help from another person bathing (including washing, rinsing, drying)?: A Lot Help from another person to put on and taking off regular upper body clothing?: A Little Help from another person to put on and taking off regular lower body clothing?: A Lot 6 Click Score: 16    End of Session    OT Visit Diagnosis: Other abnormalities of gait and mobility (R26.89);Muscle weakness (generalized) (M62.81)   Activity Tolerance Patient tolerated treatment well   Patient Left with call bell/phone within reach;in chair   Nurse Communication Mobility status;Need for lift equipment        Time: [id number] OT Time Calculation (min): 40 min  Charges: OT General Charges $OT Visit: 1 Visit OT Treatments $Self Care/Home Management : 8-22 mins  [name], M.S. OTR/L  07/10/22, 2:59 PM  ascom [phone]

## 2022-07-10 NOTE — TOC Progression Note (Addendum)
Transition of Care [facility]) - Progression Note    Patient Details  Name: Gary [name] MRN: [id number] Date of Birth: 1955-04-21  Transition of [facility]) CM/SW Contact  Ross Ludwig, [place] Phone Number: 07/09/2022 4:05pm  Clinical Narrative:     CSW spoke with Tammy at [employer] she suggested to try Jorje Guild at [facility], sometimes they will accept patients who have [employer] and Health visitor.  CSW to contact them to see if they can review the referral.  Expected Discharge Plan: [place] Barriers to Discharge: ED Unsafe disposition  Expected Discharge Plan and Services   Discharge Planning Services: CM Consult Post Acute Care Choice: [place] Living arrangements for the past 2 months: Single Family Home                 DME Arranged: N/A DME Agency: NA                   Social Determinants of Health (SDOH) Interventions SDOH Screenings   Food Insecurity: No Food Insecurity (04/15/2022)  Housing: Low Risk  (04/15/2022)  Transportation Needs: Unmet Transportation Needs (04/15/2022)  Utilities: Not At Risk (04/15/2022)  Recent Concern: Utilities - At Risk (02/05/2022)  Depression (PHQ2-9): Low Risk  (06/07/2021)  Financial Resource Strain: Medium Risk (05/03/2017)  Physical Activity: Insufficiently Active (05/03/2017)  Social Connections: Moderately Integrated (05/03/2017)  Stress: No Stress Concern Present (05/03/2017)  Tobacco Use: Medium Risk (04/26/2022)    Readmission Risk Interventions    12/31/2021    8:58 AM 12/20/2021    4:21 PM 05/04/2021    1:26 PM  Readmission Risk Prevention Plan  Transportation Screening Complete Complete Complete  Medication Review Press photographer) Complete Complete Complete  PCP or Specialist appointment within 3-5 days of discharge Complete Complete Complete  HRI or Home Care Consult Complete Complete Complete  SW Recovery Care/Counseling Consult Complete Complete  Complete  Palliative Care Screening Complete Complete Not [place] Not Applicable Not Applicable Not Applicable

## 2022-07-10 NOTE — Assessment & Plan Note (Signed)
-   Continue home trilogy at bedtime

## 2022-07-10 NOTE — Assessment & Plan Note (Signed)
On examination, patient is not fulminant Lee hypervolemic, with only 1+ pitting edema bilateral lower extremities that may be dependent given patient is relatively sedentary.  He has received one-time dose of IV Lasix.  Will transition back to p.o. torsemide  - Restart home torsemide 20 mg twice daily - Titrate as indicated

## 2022-07-10 NOTE — Assessment & Plan Note (Signed)
Hemoglobin currently at baseline.  - Continue home iron supplementation

## 2022-07-10 NOTE — Progress Notes (Signed)
Physical Therapy Treatment Patient Details Name: Gary [name] MRN: [id number] DOB: Jan 20, 1956 Today's Date: 07/10/2022   History of Present Illness Gary [name] is a 58yoM who comes to [facility] on 04/26/21 reports his self care needs not being met after prior DC, staffing issues in home and reportedly not able to get his medications. Pt was DC from here 10 days prior after admission for respiratory distress, found to have active influenza infection, also CHF exacerbation. PMH: morbid obesity, CRF s/p tracheostomy c PMV when awake and triology vent support at when sleeping, HTN, COPD   PT Comments    Patient was agreeable to PT session. He reports feeling mild shortness of breath earlier today. The patient required minimal assistance for bed mobility. Two standing bouts performed from elevated bed and chair heights of 30 inches. Two bouts of ambulation performed with rolling walker and chair follow for safety, 71ft and 166ft with short seated rest break between bouts of walking. The patient walked the farthest distance today so far during this hospital stay. Sp02 90% on 4 L02 via trach collar. Mild dyspnea with exertion that subsides with rest break. Educated patient on the importance for progressing to standing from lower heights to simulate home and community environments. Recommend routine out of bed to chair activity with staff assistance to promote upright conditioning and strengthening. PT will continue to follow to maximize independence.    Recommendations for follow up therapy are one component of a multi-disciplinary discharge planning process, led by the attending physician.  Recommendations may be updated based on patient status, additional functional criteria and insurance authorization.  Follow Up Recommendations  Can patient physically be transported by private vehicle: No    Assistance Recommended at Discharge Intermittent Supervision/Assistance  Patient can return home with the  following Assist for transportation;Help with stairs or ramp for entrance;A lot of help with walking and/or transfers;A little help with bathing/dressing/bathroom;Assistance with Education officer, environmental (measurements PT);BSC/3in1;Hospital bed;Wheelchair cushion (measurements PT);Rolling walker (2 wheels)    Recommendations for Other Services       Precautions / Restrictions Precautions Precautions: Fall Restrictions Weight Bearing Restrictions: No     Mobility  Bed Mobility Overal bed mobility: Needs Assistance Bed Mobility: Supine to Sit     Supine to sit: Min assist     General bed mobility comments: cues for technique. assistance for trunk support and occasionally for LE    Transfers Overall transfer level: Needs assistance Equipment used: Rolling walker (2 wheels) Transfers: Sit to/from Stand Sit to Stand: Min guard           General transfer comment: bed and chair height elevated to around 30 inches. cues for technique and task initiation, anterior weight shifting. 2 bouts of standing performed    Ambulation/Gait Ambulation/Gait assistance: Supervision Gait Distance (Feet):  (78ft and 167ft) Assistive device: Rolling walker (2 wheels) Gait Pattern/deviations: Step-through pattern Gait velocity: decreased     General Gait Details: chair follow for safety. no physical assistance required. occasional cues for safety and for rest breaks as needed. patient demonstrates reciprocal gait. activity tolerance limited by fatigue. mild shortenss of breath noted with Sp02 90% after walking on 4 L02 via trach collar. heart rate in the 110's   Stairs             Wheelchair Mobility    Modified Rankin (Stroke Patients Only)       Balance Overall balance assessment: Needs assistance Sitting-balance support: Feet supported Sitting balance-[name] Scale:  Normal     Standing balance support: Bilateral upper extremity  supported Standing balance-[name] Scale: Fair Standing balance comment: heavy reliance on rolling walker for support in standing                            Cognition Arousal/Alertness: Awake/alert Behavior During Therapy: WFL for tasks assessed/performed Overall Cognitive Status: Within Functional Limits for tasks assessed                                          Exercises      General Comments General comments (skin integrity, edema, etc.): noted patient with acute respiratory issues this morning. negative for PE and DVT per reports.      Pertinent Vitals/Pain Pain Assessment Pain Assessment: No/denies pain    Home Living                          Prior Function            PT Goals (current goals can now be found in the care plan section) Acute Rehab PT Goals Patient Stated Goal: to walk and go home PT Goal Formulation: With patient Time For Goal Achievement: 07/19/22 Potential to Achieve Goals: Fair Progress towards PT goals: Progressing toward goals    Frequency    Min 2X/week      PT Plan Current plan remains appropriate    Co-evaluation PT/OT/SLP Co-Evaluation/Treatment: Yes Reason for Co-Treatment: For patient/therapist safety;To address functional/ADL transfers PT goals addressed during session: Mobility/safety with mobility OT goals addressed during session: ADL's and self-care      AM-PAC PT "6 Clicks" Mobility   Outcome Measure  Help needed turning from your back to your side while in a flat bed without using bedrails?: A Lot Help needed moving from lying on your back to sitting on the side of a flat bed without using bedrails?: A Lot Help needed moving to and from a bed to a chair (including a wheelchair)?: A Lot Help needed standing up from a chair using your arms (e.g., wheelchair or bedside chair)?: Total Help needed to walk in hospital room?: Total Help needed climbing 3-5 steps with a railing? :  Total 6 Click Score: 9    End of Session Equipment Utilized During Treatment: Oxygen Activity Tolerance: Patient tolerated treatment well Patient left: in bed;with call bell/phone within reach Nurse Communication: Mobility status PT Visit Diagnosis: Unsteadiness on feet (R26.81);Muscle weakness (generalized) (M62.81);Difficulty in walking, not elsewhere classified (R26.2)     Time: [id number] PT Time Calculation (min) (ACUTE ONLY): 40 min  Charges:  $Therapeutic Activity: 23-37 mins                     [name], PT, MPT    [name] 07/10/2022, 2:56 PM

## 2022-07-10 NOTE — ED Notes (Signed)
Pt lying in bed at this time with eyes closed.  No acute distress noted.  Call light in reach.

## 2022-07-11 DIAGNOSIS — J9621 Acute and chronic respiratory failure with hypoxia: Secondary | ICD-10-CM | POA: Diagnosis not present

## 2022-07-11 LAB — RESPIRATORY PANEL BY PCR

## 2022-07-11 LAB — CBC WITH DIFFERENTIAL/PLATELET
Abs Immature Granulocytes: 0.04 10*3/uL (ref 0.00–0.07)
Basophils Absolute: 0 10*3/uL (ref 0.0–0.1)
Basophils Relative: 0 %
Eosinophils Absolute: 0 10*3/uL (ref 0.0–0.5)
Eosinophils Relative: 0 %
HCT: 39.5 % (ref 39.0–52.0)
Hemoglobin: 10.6 g/dL — ABNORMAL LOW (ref 13.0–17.0)
Immature Granulocytes: 1 %
Lymphocytes Relative: 16 %
Lymphs Abs: 1.1 10*3/uL (ref 0.7–4.0)
MCH: 21.3 pg — ABNORMAL LOW (ref 26.0–34.0)
MCHC: 26.8 g/dL — ABNORMAL LOW (ref 30.0–36.0)
MCV: 79.5 fL — ABNORMAL LOW (ref 80.0–100.0)
Monocytes Absolute: 0.4 10*3/uL (ref 0.1–1.0)
Monocytes Relative: 6 %
Neutro Abs: 5.3 10*3/uL (ref 1.7–7.7)
Neutrophils Relative %: 77 %
Platelets: 245 10*3/uL (ref 150–400)
RBC: 4.97 MIL/uL (ref 4.22–5.81)
RDW: 16.8 % — ABNORMAL HIGH (ref 11.5–15.5)
WBC: 6.8 10*3/uL (ref 4.0–10.5)
nRBC: 0.4 % — ABNORMAL HIGH (ref 0.0–0.2)

## 2022-07-11 LAB — BASIC METABOLIC PANEL
Anion gap: 7 (ref 5–15)
BUN: 31 mg/dL — ABNORMAL HIGH (ref 8–23)
CO2: 33 mmol/L — ABNORMAL HIGH (ref 22–32)
Calcium: 8.5 mg/dL — ABNORMAL LOW (ref 8.9–10.3)
Chloride: 99 mmol/L (ref 98–111)
Creatinine, Ser: 1.33 mg/dL — ABNORMAL HIGH (ref 0.61–1.24)
GFR, Estimated: 59 mL/min — ABNORMAL LOW (ref >60)
Glucose, Bld: 132 mg/dL — ABNORMAL HIGH (ref 70–99)
Potassium: 4 mmol/L (ref 3.5–5.1)
Sodium: 139 mmol/L (ref 135–145)

## 2022-07-11 LAB — MAGNESIUM: Magnesium: 2.3 mg/dL (ref 1.7–2.4)

## 2022-07-11 LAB — HIV ANTIBODY (ROUTINE TESTING W REFLEX): HIV Screen 4th Generation wRfx: NONREACTIVE

## 2022-07-11 MED ORDER — IPRATROPIUM-ALBUTEROL 0.5-2.5 (3) MG/3ML IN SOLN
3.0000 mL | Freq: Four times a day (QID) | RESPIRATORY_TRACT | Status: DC | PRN
  Administered 2022-07-20 – 2022-07-24 (×2): 3 mL via RESPIRATORY_TRACT
  Filled 2022-07-11 (×2): qty 3

## 2022-07-11 MED ORDER — TRAZODONE HCL 50 MG PO TABS
50.0000 mg | ORAL_TABLET | Freq: Every evening | ORAL | Status: DC | PRN
  Administered 2022-07-11 – 2022-07-22 (×3): 50 mg via ORAL
  Filled 2022-07-11 (×3): qty 1

## 2022-07-11 MED ORDER — IPRATROPIUM-ALBUTEROL 0.5-2.5 (3) MG/3ML IN SOLN
3.0000 mL | Freq: Two times a day (BID) | RESPIRATORY_TRACT | Status: DC
  Administered 2022-07-11 – 2022-07-17 (×13): 3 mL via RESPIRATORY_TRACT
  Filled 2022-07-11 (×13): qty 3

## 2022-07-11 MED ORDER — BUDESONIDE 0.5 MG/2ML IN SUSP
0.5000 mg | Freq: Two times a day (BID) | RESPIRATORY_TRACT | Status: DC
  Administered 2022-07-11 – 2022-07-26 (×31): 0.5 mg via RESPIRATORY_TRACT
  Filled 2022-07-11 (×31): qty 2

## 2022-07-11 MED ORDER — METOPROLOL TARTRATE 5 MG/5ML IV SOLN: 5.0000 mg | INTRAVENOUS | Status: DC | PRN

## 2022-07-11 MED ORDER — HYDRALAZINE HCL 20 MG/ML IJ SOLN: 10.0000 mg | INTRAMUSCULAR | Status: DC | PRN

## 2022-07-11 NOTE — Progress Notes (Signed)
Physical Therapy Treatment Patient Details Name: DELYNN MARANA MRN: [id number] DOB: [birth year] Today's Date: 07/11/2022   History of Present Illness Sincere Gaye is a 65yoM who comes to [facility] on 04/26/21 reports his self care needs not being met after prior DC, staffing issues in home and reportedly not able to get his medications. Pt was DC from here 10 days prior after admission for respiratory distress, found to have active influenza infection, also CHF exacerbation. PMH: morbid obesity, CRF s/p tracheostomy c PMV when awake and triology vent support at when sleeping, HTN, COPD    PT Comments    Patient is agreeable to PT.  He is cooperative throughout and motivated to participate. He was able to stand from 29in height today, which is an improvement from previous session. Cues and technique to facilitate standing from lower surface. He walked 3 bouts, 34ft, 57ft, 42ft with short seated rest break between bouts of walking. Heavy reliance on the rolling walker with upper body fatigue reported with ambulation. Dyspnea with exertion with Sp02 94% after walking on 4 L02. Recommend to continue PT to maximize independence and decrease caregiver burden. Patient is making progress towards meeting PT goals.    Recommendations for follow up therapy are one component of a multi-disciplinary discharge planning process, led by the attending physician.  Recommendations may be updated based on patient status, additional functional criteria and insurance authorization.  Follow Up Recommendations  Can patient physically be transported by private vehicle: No    Assistance Recommended at Discharge Intermittent Supervision/Assistance  Patient can return home with the following Assist for transportation;Help with stairs or ramp for entrance;A lot of help with walking and/or transfers;A little help with bathing/dressing/bathroom;Assistance with Education officer, environmental (measurements  PT);BSC/3in1;Hospital bed;Wheelchair cushion (measurements PT);Rolling walker (2 wheels)    Recommendations for Other Services       Precautions / Restrictions Precautions Precautions: Fall Restrictions Weight Bearing Restrictions: No     Mobility  Bed Mobility Overal bed mobility: Needs Assistance Bed Mobility: Supine to Sit, Sit to Supine     Supine to sit: Min assist Sit to supine: Mod assist   General bed mobility comments: assistance for trunk support to sit upright and assistance for LE support to return to bed. increased time and effort required with cues for technique    Transfers Overall transfer level: Needs assistance Equipment used: Rolling walker (2 wheels) Transfers: Sit to/from Stand Sit to Stand: Supervision           General transfer comment: 3 standing bouts performed with an elevated height of 29 inches, which is an improvement from previous session    Ambulation/Gait Ambulation/Gait assistance: Supervision Gait Distance (Feet):  (19ft, 62ft, 71ft) Assistive device: Rolling walker (2 wheels) Gait Pattern/deviations: Step-through pattern Gait velocity: decreased     General Gait Details: chair follow for safety. 2 seated rest breaks with activity tolerance limited by fatigue and upper body fatigue. heavy reliance on rolling walker for support   Stairs             Wheelchair Mobility    Modified Rankin (Stroke Patients Only)       Balance Overall balance assessment: Needs assistance Sitting-balance support: Feet supported Sitting balance-[name] Scale: Normal     Standing balance support: Bilateral upper extremity supported Standing balance-[name] Scale: Fair Standing balance comment: heavy reliance on rolling walker for support in standing  Cognition Arousal/Alertness: Awake/alert Behavior During Therapy: WFL for tasks assessed/performed Overall Cognitive Status: Within Functional Limits  for tasks assessed                                          Exercises      General Comments General comments (skin integrity, edema, etc.): Sp02 94% after ambulating on 4 L02      Pertinent Vitals/Pain Pain Assessment Pain Assessment: No/denies pain    Home Living                          Prior Function            PT Goals (current goals can now be found in the care plan section) Acute Rehab PT Goals Patient Stated Goal: to walk and go home PT Goal Formulation: With patient Time For Goal Achievement: 07/19/22 Potential to Achieve Goals: Fair Progress towards PT goals: Progressing toward goals    Frequency    Min 2X/week      PT Plan Current plan remains appropriate    Co-evaluation              AM-PAC PT "6 Clicks" Mobility   Outcome Measure  Help needed turning from your back to your side while in a flat bed without using bedrails?: A Lot Help needed moving from lying on your back to sitting on the side of a flat bed without using bedrails?: A Lot Help needed moving to and from a bed to a chair (including a wheelchair)?: A Lot Help needed standing up from a chair using your arms (e.g., wheelchair or bedside chair)?: Total Help needed to walk in hospital room?: Total Help needed climbing 3-5 steps with a railing? : Total 6 Click Score: 9    End of Session Equipment Utilized During Treatment: Oxygen (trach collar, 4 L) Activity Tolerance: Patient tolerated treatment well Patient left: in bed;with call bell/phone within reach Nurse Communication: Mobility status PT Visit Diagnosis: Unsteadiness on feet (R26.81);Muscle weakness (generalized) (M62.81);Difficulty in walking, not elsewhere classified (R26.2)     Time: 1335-1430 PT Time Calculation (min) (ACUTE ONLY): 55 min  Charges:  $Gait Training: 8-22 mins $Therapeutic Activity: 38-52 mins                     [name], PT, MPT    [name] 07/11/2022,  3:35 PM

## 2022-07-11 NOTE — TOC Progression Note (Signed)
Transition of Care [facility]) - Progression Note    Patient Details  Name: Gary [name] MRN: [id number] Date of Birth: 1955/04/20  [facility]) CM/SW Contact  Ross Ludwig, [place] Phone Number: 07/11/2022, 4:46 PM  Clinical Narrative:     CSW received a call back from [facility] from [place], he requested that his clinicals be emailed to her at [email or web address] so she can review patient's information.  Waiting for call back from Elfrida at [place].  Expected Discharge Plan: [place] Barriers to Discharge: ED Unsafe disposition  Expected Discharge Plan and Services   Discharge Planning Services: CM Consult Post Acute Care Choice: [place] Living arrangements for the past 2 months: Single Family Home                 DME Arranged: N/A DME Agency: NA                   Social Determinants of Health (SDOH) Interventions SDOH Screenings   Food Insecurity: No Food Insecurity (04/15/2022)  Housing: Low Risk  (04/15/2022)  Transportation Needs: Unmet Transportation Needs (04/15/2022)  Utilities: Not At Risk (04/15/2022)  Recent Concern: Utilities - At Risk (02/05/2022)  Depression (PHQ2-9): Low Risk  (06/07/2021)  Financial Resource Strain: Medium Risk (05/03/2017)  Physical Activity: Insufficiently Active (05/03/2017)  Social Connections: Moderately Integrated (05/03/2017)  Stress: No Stress Concern Present (05/03/2017)  Tobacco Use: Medium Risk (04/26/2022)    Readmission Risk Interventions    12/31/2021    8:58 AM 12/20/2021    4:21 PM 05/04/2021    1:26 PM  Readmission Risk Prevention Plan  Transportation Screening Complete Complete Complete  Medication Review 05/17/2021) Complete Complete Complete  PCP or Specialist appointment within 3-5 days of discharge Complete Complete Complete  HRI or Home Care Consult Complete Complete Complete  SW Recovery Care/Counseling Consult Complete Complete Complete  Palliative Care  Screening Complete Complete Not [place] Not Applicable Not Applicable Not Applicable

## 2022-07-11 NOTE — Progress Notes (Signed)
PROGRESS NOTE    Gary [name]  [id number] DOB: 11-28-1955 DOA: 04/26/2022 PCP: [facility] [place]   Brief Narrative:   67 y.o. male with medical history significant of HFpEF with EF of 55-60%, chronic respiratory failure s/p tracheostomy, COPD, hypertension, OSA, hypertension, who presents to the ED due to abdominal pain.  Patient admits of increasing productive cough and rhinorrhea over the past couple of weeks.  Patient has been in the ED for placement for over the last 2 months but while waiting in the ER became hypoxic.  CTA chest showed evidence of bronchial wall thickening concerning for bronchitis but no evidence of PE.  Patient was given dose of IV Lasix due to signs of volume overload and bronchodilators.  Admitted to medical service.   Assessment & Plan:  Principal Problem:   Acute on chronic hypoxic respiratory failure ([name]) Active Problems:   (HFpEF) heart failure with preserved ejection fraction ([name])   Obstructive sleep apnea   Chronic kidney disease, stage 3a ([name])   Anxiety and depression   Iron deficiency anemia     Assessment and Plan: * Acute on chronic hypoxic respiratory failure ([name]) Acute bronchitis/COPD exacerbation -Patient was noted to be hypoxic in the ED therefore being admitted and has increasing secretions from his tracheostomy.  Procalcitonin and BNP are negative.  COVID/flu negative.  Will check respiratory panel. - CTA chest negative for pulmonary embolism. - Scheduled and as needed bronchodilators.  (HFpEF) heart failure with preserved ejection fraction ([place]) -Does not appear to be in any acute exacerbation.  Did receive 1 dose of IV Lasix.  His BMP is unremarkable.  Now on home oral torsemide  Obstructive sleep apnea - Continue home trilogy at bedtime  Chronic kidney disease, stage 3a ([place]) Renal function currently at baseline.   Anxiety and depression - Continue home SSRI and Atarax  Iron deficiency anemia Hemoglobin  currently at baseline. - Continue home iron supplementation. Add bowel regimen prn    DVT prophylaxis: Eliquis Code Status: Full  Family Communication:   Ongoing evaluation for hypoxia.  Respiratory panel has been ordered.    Subjective: Seen and examined at bedside, no complaints.  Still has some coughing and congestion   Examination:  General exam: Appears calm and comfortable, on chronic trach Respiratory system: Some bowel rhonchi Cardiovascular system: S1 & S2 heard, RRR. No JVD, murmurs, rubs, gallops or clicks. No pedal edema. Gastrointestinal system: Abdomen is nondistended, soft and nontender. No organomegaly or masses felt. Normal bowel sounds heard. Central nervous system: Alert and oriented. No focal neurological deficits. Extremities: Symmetric 5 x 5 power. Skin: No rashes, lesions or ulcers Psychiatry: Judgement and insight appear normal. Mood & affect appropriate.   Tracheostomy in place External catheter in place  Objective: Vitals:   07/10/22 2015 07/10/22 2310 07/11/22 0006 07/11/22 0452  BP: 137/88  138/89 119/80  Pulse: 86  80 73  Resp: 20  20 20   Temp: 98.3 F (36.8 C)  98.3 F (36.8 C) 97.9 F (36.6 C)  TempSrc: Oral     SpO2: 95% 94% 93% 91%  Weight:      Height:        Intake/Output Summary (Last 24 hours) at 07/11/2022 0842 Last data filed at 07/11/2022 0610 Gross per 24 hour  Intake 480 ml  Output 2500 ml  Net -2020 ml   Filed Weights   05/04/22 0833 05/04/22 1318  Weight: (!) 162.3 kg (!) 177.4 kg     Data Reviewed:  CBC: Recent Labs  Lab 07/09/22 0950 07/10/22 0810 07/11/22 0749  WBC 5.9 6.8 6.8  NEUTROABS  --  4.6 5.3  HGB 10.5* 10.2* 10.6*  HCT 39.3 37.8* 39.5  MCV 80.4 78.8* 79.5*  PLT 241 252 [id number]   Basic Metabolic Panel: Recent Labs  Lab 07/10/22 0810  NA 137  K 3.5  CL 100  CO2 32  GLUCOSE 118*  BUN 28*  CREATININE 1.38*  CALCIUM 8.2*   GFR: Estimated Creatinine Clearance: 91.9 mL/min (A) (by C-G  formula based on SCr of 1.38 mg/dL (H)). Liver Function Tests: Recent Labs  Lab 07/10/22 0810  AST 14*  ALT 11  ALKPHOS 104  BILITOT 0.4  PROT 7.9  ALBUMIN 3.0*   No results for input(s): "LIPASE", "AMYLASE" in the last 168 hours. No results for input(s): "AMMONIA" in the last 168 hours. Coagulation Profile: No results for input(s): "INR", "PROTIME" in the last 168 hours. Cardiac Enzymes: No results for input(s): "CKTOTAL", "CKMB", "CKMBINDEX", "TROPONINI" in the last 168 hours. BNP (last 3 results) No results for input(s): "PROBNP" in the last 8760 hours. HbA1C: No results for input(s): "HGBA1C" in the last 72 hours. CBG: No results for input(s): "GLUCAP" in the last 168 hours. Lipid Profile: No results for input(s): "CHOL", "HDL", "LDLCALC", "TRIG", "CHOLHDL", "LDLDIRECT" in the last 72 hours. Thyroid Function Tests: No results for input(s): "TSH", "T4TOTAL", "FREET4", "T3FREE", "THYROIDAB" in the last 72 hours. Anemia Panel: No results for input(s): "VITAMINB12", "FOLATE", "FERRITIN", "TIBC", "IRON", "RETICCTPCT" in the last 72 hours. Sepsis Labs: Recent Labs  Lab 07/10/22 0808 07/10/22 0810  PROCALCITON <0.10  --   LATICACIDVEN  --  1.7    Recent Results (from the past 240 hour(s))  Resp panel by RT-PCR (RSV, Flu A&B, Covid) Anterior Nasal Swab     Status: None   Collection Time: 07/10/22  7:20 AM   Specimen: Anterior Nasal Swab  Result Value Ref Range Status   SARS Coronavirus 2 by RT PCR NEGATIVE NEGATIVE Final    Comment: (NOTE) SARS-CoV-2 target nucleic acids are NOT DETECTED.  The SARS-CoV-2 RNA is generally detectable in upper respiratory specimens during the acute phase of infection. The lowest concentration of SARS-CoV-2 viral copies this assay can detect is 138 copies/mL. A negative result does not preclude SARS-Cov-2 infection and should not be used as the sole basis for treatment or other patient management decisions. A negative result may occur  with  improper specimen collection/handling, submission of specimen other than nasopharyngeal swab, presence of viral mutation(s) within the areas targeted by this assay, and inadequate number of viral copies(<138 copies/mL). A negative result must be combined with clinical observations, patient history, and epidemiological information. The expected result is Negative.  Fact Sheet for Patients:  EntrepreneurPulse.com.au  Fact Sheet for Healthcare Providers:  IncredibleEmployment.be  This test is no t yet approved or cleared by the [place] FDA and  has been authorized for detection and/or diagnosis of SARS-CoV-2 by FDA under an Emergency Use Authorization (EUA). This EUA will remain  in effect (meaning this test can be used) for the duration of the COVID-19 declaration under Section 564(b)(1) of the Act, 21 U.S.C.section 360bbb-3(b)(1), unless the authorization is terminated  or revoked sooner.       Influenza A by PCR NEGATIVE NEGATIVE Final   Influenza B by PCR NEGATIVE NEGATIVE Final    Comment: (NOTE) The Xpert Xpress SARS-CoV-2/FLU/RSV plus assay is intended as an aid in the diagnosis of influenza from Nasopharyngeal swab specimens  and should not be used as a sole basis for treatment. Nasal washings and aspirates are unacceptable for Xpert Xpress SARS-CoV-2/FLU/RSV testing.  Fact Sheet for Patients: EntrepreneurPulse.com.au  Fact Sheet for Healthcare Providers: IncredibleEmployment.be  This test is not yet approved or cleared by the [place] FDA and has been authorized for detection and/or diagnosis of SARS-CoV-2 by FDA under an Emergency Use Authorization (EUA). This EUA will remain in effect (meaning this test can be used) for the duration of the COVID-19 declaration under Section 564(b)(1) of the Act, 21 U.S.C. section 360bbb-3(b)(1), unless the authorization is terminated  or revoked.     Resp Syncytial Virus by PCR NEGATIVE NEGATIVE Final    Comment: (NOTE) Fact Sheet for Patients: EntrepreneurPulse.com.au  Fact Sheet for Healthcare Providers: IncredibleEmployment.be  This test is not yet approved or cleared by the [place] FDA and has been authorized for detection and/or diagnosis of SARS-CoV-2 by FDA under an Emergency Use Authorization (EUA). This EUA will remain in effect (meaning this test can be used) for the duration of the COVID-19 declaration under Section 564(b)(1) of the Act, 21 U.S.C. section 360bbb-3(b)(1), unless the authorization is terminated or revoked.  Performed at [facility], [address] [place], [place] [address]          Radiology Studies: CT Angio Chest PE W and/or Wo Contrast  Result Date: 07/10/2022 CLINICAL DATA:  Hypoxic, PE suspected EXAM: CT ANGIOGRAPHY CHEST WITH CONTRAST TECHNIQUE: Multidetector CT imaging of the chest was performed using the standard protocol during bolus administration of intravenous contrast. Multiplanar CT image reconstructions and MIPs were obtained to evaluate the vascular anatomy. RADIATION DOSE REDUCTION: This exam was performed according to the departmental dose-optimization program which includes automated exposure control, adjustment of the mA and/or kV according to patient size and/or use of iterative reconstruction technique. CONTRAST:  158mL OMNIPAQUE IOHEXOL 350 MG/ML SOLN COMPARISON:  07/20/2021 CTA chest, correlation is also made with 10/06/2021 CT chest FINDINGS: Cardiovascular: Evaluation is limited by bolus timing, as more contrast is in the pulmonary veins and aorta than in the pulmonary arteries. Within this limitation, no evidence of pulmonary embolism to the level of lobar arteries. The main pulmonary artery is enlarged, measuring to 3.7 cm. Cardiomegaly. No pericardial effusion. Mediastinum/Nodes: Tracheostomy tube. No  enlarged mediastinal, hilar, or axillary lymph nodes. Thyroid gland, trachea, and esophagus demonstrate no significant findings. Lungs/Pleura: Linear opacities, likely atelectasis and/or scarring. Bronchial wall thickening. No pleural effusion or pneumothorax. Upper Abdomen: No acute abnormality. Musculoskeletal: No chest wall abnormality. No acute or significant osseous findings. Remote left rib fractures. Review of the MIP images confirms the above findings. IMPRESSION: 1. Evaluation is limited by bolus timing, as more contrast is in the pulmonary veins and aorta than in the pulmonary arteries. Within this limitation, no evidence of pulmonary embolism to the level of lobar arteries. 2. Bronchial wall thickening, as can be seen with nonspecific infectious or inflammatory bronchitis. 3. Cardiomegaly. 4. The main pulmonary artery is enlarged, measuring to 3.7 cm, as can be seen in pulmonary hypertension. Electronically Signed   By: Merilyn Baba M.D.   On: 07/10/2022 12:16   US Venous Img Lower Bilateral  Result Date: 07/10/2022 CLINICAL DATA:  Pain and edema, hypoxia EXAM: BILATERAL LOWER EXTREMITY VENOUS DOPPLER ULTRASOUND TECHNIQUE: Gray-scale sonography with graded compression, as well as color Doppler and duplex ultrasound were performed to evaluate the lower extremity deep venous systems from the level of the common femoral vein and including the common femoral, femoral,  profunda femoral, popliteal and calf veins including the posterior tibial, peroneal and gastrocnemius veins when visible.Spectral Doppler was utilized to evaluate flow at rest and with distal augmentation maneuvers in the common femoral, femoral and popliteal veins. COMPARISON:  None Available. FINDINGS: RIGHT LOWER EXTREMITY Common Femoral Vein: No evidence of thrombus. Normal compressibility, respiratory phasicity and response to augmentation. Saphenofemoral Junction: No evidence of thrombus. Normal compressibility and flow on color  Doppler imaging. Profunda Femoral Vein: No evidence of thrombus. Normal compressibility and flow on color Doppler imaging. Femoral Vein: No evidence of thrombus. Normal compressibility, respiratory phasicity and response to augmentation. Popliteal Vein: No evidence of thrombus. Normal compressibility, respiratory phasicity and response to augmentation. Calf Veins: Limited visualization.  No gross thrombus appreciated. LEFT LOWER EXTREMITY Common Femoral Vein: No evidence of thrombus. Normal compressibility, respiratory phasicity and response to augmentation. Saphenofemoral Junction: No evidence of thrombus. Normal compressibility and flow on color Doppler imaging. Profunda Femoral Vein: No evidence of thrombus. Normal compressibility and flow on color Doppler imaging. Femoral Vein: No evidence of thrombus. Normal compressibility, respiratory phasicity and response to augmentation. Popliteal Vein: No evidence of thrombus. Normal compressibility, respiratory phasicity and response to augmentation. Calf Veins: Limited visualization.  No gross thrombus appreciated. IMPRESSION: No evidence of significant deep venous thrombosis in either lower extremity. Electronically Signed   By: Jerilynn Mages.  Shick M.D.   On: 07/10/2022 10:47   DG Chest Port 1 View  Result Date: 07/10/2022 CLINICAL DATA:  Hypoxia. EXAM: PORTABLE CHEST 1 VIEW COMPARISON:  04/26/2022 FINDINGS: Cardiomegaly and vascular pedicle widening with diffuse interstitial opacity. Remote left rib fractures. No visible effusion or pneumothorax. Tracheostomy tube in place. IMPRESSION: Cardiomegaly and vascular congestion. Electronically Signed   By: Jorje Guild M.D.   On: 07/10/2022 07:28        Scheduled Meds:  apixaban  2.5 mg Oral BID   escitalopram  5 mg Oral Daily   ferrous sulfate  325 mg Oral Q breakfast   guaiFENesin  600 mg Oral BID   melatonin  5 mg Oral QHS   pantoprazole  40 mg Oral BID AC   predniSONE  40 mg Oral Q breakfast   sodium chloride  flush  3 mL Intravenous Q12H   torsemide  20 mg Oral BID   Continuous Infusions:   LOS: 0 days   Time spent= 35 mins    Markala Sitts Arsenio Loader, MD [facility]  If 7PM-7AM, please contact night-coverage  07/11/2022, 8:42 AM

## 2022-07-11 NOTE — TOC Progression Note (Addendum)
Transition of Care [facility]) - Progression Note    Patient Details  Name: Gary [name] MRN: [id number] Date of Birth: [birth year]  [facility] CM/SW Contact  Laurena Slimmer, RN Phone Number: 07/11/2022, 11:04 AM  Clinical Narrative: Damaris Schooner with Drue Dun in admissions at   [facility] by [facility] @ [phone]. They have male LTC beds and have the capacity to manage NIV and trachs. Face sheet, H&P, MD progress note, and PT/ OT progress notes emailed to [email or web address]. Drue Dun also provided her fax number at [phone].  Attempted to contact Jorje Guild at [facility]. No answer. Left a message.        Expected Discharge Plan: [place] Barriers to Discharge: ED Unsafe disposition  Expected Discharge Plan and Services   Discharge Planning Services: CM Consult Post Acute Care Choice: [place] Living arrangements for the past 2 months: Single Family Home                 DME Arranged: N/A DME Agency: NA                   Social Determinants of Health (SDOH) Interventions SDOH Screenings   Food Insecurity: No Food Insecurity (04/15/2022)  Housing: Low Risk  (04/15/2022)  Transportation Needs: Unmet Transportation Needs (04/15/2022)  Utilities: Not At Risk (04/15/2022)  Recent Concern: Utilities - At Risk (02/05/2022)  Depression (PHQ2-9): Low Risk  (06/07/2021)  Financial Resource Strain: Medium Risk (05/03/2017)  Physical Activity: Insufficiently Active (05/03/2017)  Social Connections: Moderately Integrated (05/03/2017)  Stress: No Stress Concern Present (05/03/2017)  Tobacco Use: Medium Risk (04/26/2022)    Readmission Risk Interventions    12/31/2021    8:58 AM 12/20/2021    4:21 PM 05/04/2021    1:26 PM  Readmission Risk Prevention Plan  Transportation Screening Complete Complete Complete  Medication Review Press photographer) Complete Complete Complete  PCP or Specialist  appointment within 3-5 days of discharge Complete Complete Complete  HRI or Home Care Consult Complete Complete Complete  SW Recovery Care/Counseling Consult Complete Complete Complete  Palliative Care Screening Complete Complete Not [place] Not Applicable Not Applicable Not Applicable

## 2022-07-12 DIAGNOSIS — J9621 Acute and chronic respiratory failure with hypoxia: Secondary | ICD-10-CM | POA: Diagnosis not present

## 2022-07-12 LAB — BASIC METABOLIC PANEL
Anion gap: 8 (ref 5–15)
BUN: 37 mg/dL — ABNORMAL HIGH (ref 8–23)
CO2: 33 mmol/L — ABNORMAL HIGH (ref 22–32)
Calcium: 8.3 mg/dL — ABNORMAL LOW (ref 8.9–10.3)
Chloride: 98 mmol/L (ref 98–111)
Creatinine, Ser: 1.39 mg/dL — ABNORMAL HIGH (ref 0.61–1.24)
GFR, Estimated: 56 mL/min — ABNORMAL LOW (ref >60)
Glucose, Bld: 91 mg/dL (ref 70–99)
Potassium: 3.9 mmol/L (ref 3.5–5.1)
Sodium: 139 mmol/L (ref 135–145)

## 2022-07-12 LAB — CBC
HCT: 39.3 % (ref 39.0–52.0)
Hemoglobin: 10.7 g/dL — ABNORMAL LOW (ref 13.0–17.0)
MCH: 21.5 pg — ABNORMAL LOW (ref 26.0–34.0)
MCHC: 27.2 g/dL — ABNORMAL LOW (ref 30.0–36.0)
MCV: 79.1 fL — ABNORMAL LOW (ref 80.0–100.0)
Platelets: 256 10*3/uL (ref 150–400)
RBC: 4.97 MIL/uL (ref 4.22–5.81)
RDW: 17 % — ABNORMAL HIGH (ref 11.5–15.5)
WBC: 6.4 10*3/uL (ref 4.0–10.5)
nRBC: 0.5 % — ABNORMAL HIGH (ref 0.0–0.2)

## 2022-07-12 LAB — MAGNESIUM: Magnesium: 2.2 mg/dL (ref 1.7–2.4)

## 2022-07-12 NOTE — Progress Notes (Signed)
Physical Therapy Treatment Patient Details Name: Gary [name] MRN: [id number] DOB: October 05, 1955 Today's Date: 07/12/2022   History of Present Illness Gary [name] is a 33yoM who comes to [facility] on 04/26/21 reports his self care needs not being met after prior DC, staffing issues in home and reportedly not able to get his medications. Pt was DC from here 10 days prior after admission for respiratory distress, found to have active influenza infection, also CHF exacerbation. PMH: morbid obesity, CRF s/p tracheostomy c PMV when awake and triology vent support at when sleeping, HTN, COPD    PT Comments    Patient seen for a second time today to assist patient from chair to bed and for staff education on the specialty equipment functions and handling techniques to assist with patient in the future with routine mobility. With the chair elevated,  patient able to stand with no physical assistance. He stood on the standing scales with a weight of 408 pounds. He walked a few feet over to the bed. The patient does required assistance for BLE support to get to supine position, but is then able to reposition himself independently using upper extremity strength and bed function with trendelenburg position. Encourage patient to be out of bed in chair routinely with staff assistance for upright conditioning.    Recommendations for follow up therapy are one component of a multi-disciplinary discharge planning process, led by the attending physician.  Recommendations may be updated based on patient status, additional functional criteria and insurance authorization.  Follow Up Recommendations  Can patient physically be transported by private vehicle: No    Assistance Recommended at Discharge Intermittent Supervision/Assistance  Patient can return home with the following Assist for transportation;Help with stairs or ramp for entrance;A lot of help with walking and/or transfers;A little help with  bathing/dressing/bathroom;Assistance with Education officer, environmental (measurements PT);BSC/3in1;Hospital bed;Wheelchair cushion (measurements PT);Rolling walker (2 wheels)    Recommendations for Other Services       Precautions / Restrictions Precautions Precautions: Fall Restrictions Weight Bearing Restrictions: No     Mobility  Bed Mobility Overal bed mobility: Needs Assistance Bed Mobility: Sit to Supine     Supine to sit: Min assist Sit to supine: Mod assist   General bed mobility comments: assistance for BLE support. verbal cues for technique. once supine in bed, patient uses the bed controls and upper extremity strength to adjust body positioning    Transfers Overall transfer level: Needs assistance Equipment used: Rolling walker (2 wheels) Transfers: Sit to/from Stand Sit to Stand: Supervision           General transfer comment: chair height elevated. educated staff on chair and bed functions to assist patient in the future with routine mobility.    Ambulation/Gait Ambulation/Gait assistance: Supervision Gait Distance (Feet): 5 Feet Assistive device: Rolling walker (2 wheels) Gait Pattern/deviations: Step-through pattern Gait velocity: decreased     General Gait Details: patient walked a short distance from recliner to bed without difficulty using rolling walker   Stairs             Wheelchair Mobility    Modified Rankin (Stroke Patients Only)       Balance Overall balance assessment: Needs assistance Sitting-balance support: Feet supported Sitting balance-[name] Scale: Normal     Standing balance support: Bilateral upper extremity supported Standing balance-[name] Scale: Fair Standing balance comment: heavy reliance on rolling walker for support in standing  Cognition Arousal/Alertness: Awake/alert Behavior During Therapy: WFL for tasks assessed/performed Overall  Cognitive Status: Within Functional Limits for tasks assessed                                          Exercises      General Comments General comments (skin integrity, edema, etc.): of note, the patient was able to step on the standing scale with a weight of 408 pounds. close stand by assistance provided      Pertinent Vitals/Pain Pain Assessment Pain Assessment: No/denies pain    Home Living                          Prior Function            PT Goals (current goals can now be found in the care plan section) Acute Rehab PT Goals Patient Stated Goal: to walk and go home PT Goal Formulation: With patient Time For Goal Achievement: 07/19/22 Potential to Achieve Goals: Fair Progress towards PT goals: Progressing toward goals    Frequency    Min 2X/week      PT Plan Current plan remains appropriate    Co-evaluation              AM-PAC PT "6 Clicks" Mobility   Outcome Measure  Help needed turning from your back to your side while in a flat bed without using bedrails?: A Lot Help needed moving from lying on your back to sitting on the side of a flat bed without using bedrails?: A Lot Help needed moving to and from a bed to a chair (including a wheelchair)?: A Lot Help needed standing up from a chair using your arms (e.g., wheelchair or bedside chair)?: Total Help needed to walk in hospital room?: Total Help needed climbing 3-5 steps with a railing? : Total 6 Click Score: 9    End of Session Equipment Utilized During Treatment: Oxygen Activity Tolerance: Patient tolerated treatment well Patient left: in bed;with call bell/phone within reach Nurse Communication: Mobility status PT Visit Diagnosis: Unsteadiness on feet (R26.81);Muscle weakness (generalized) (M62.81);Difficulty in walking, not elsewhere classified (R26.2)     Time: 1440-1453 PT Time Calculation (min) (ACUTE ONLY): 13 min  Charges:  $Gait Training: 8-22  mins $Therapeutic Activity: 8-22 mins                     Gary [name], PT, MPT    Gary [name] 07/12/2022, 3:06 PM

## 2022-07-12 NOTE — Progress Notes (Signed)
Physical Therapy Treatment Patient Details Name: Gary [name] MRN: [id number] DOB: Aug 25, 1955 Today's Date: 07/12/2022   History of Present Illness Gary [name] is a 75yoM who comes to [facility] on 04/26/21 reports his self care needs not being met after prior DC, staffing issues in home and reportedly not able to get his medications. Pt was DC from here 10 days prior after admission for respiratory distress, found to have active influenza infection, also CHF exacerbation. PMH: morbid obesity, CRF s/p tracheostomy c PMV when awake and triology vent support at when sleeping, HTN, COPD    PT Comments    Patient is agreeable to PT. He is cooperative and motivated. The patient required no physical assistance for standing from elevated bed and chair height of around 30 inches. The patient completed walking the lap in hallway one one seated rest break. Sp02 in the low 90's on 4 L via trach collar at end of session. Patient encouraged to sit up in the chair routinely for upright conditioning. One barrier to discharge home includes not being able to stand from a regular height  as he continues to require elevated surfaces to stand. Recommend to continue PT to maximize independence with therapy follow up recommend after hospital discharge.    Recommendations for follow up therapy are one component of a multi-disciplinary discharge planning process, led by the attending physician.  Recommendations may be updated based on patient status, additional functional criteria and insurance authorization.  Follow Up Recommendations  Can patient physically be transported by private vehicle: No    Assistance Recommended at Discharge Intermittent Supervision/Assistance  Patient can return home with the following Assist for transportation;Help with stairs or ramp for entrance;A lot of help with walking and/or transfers;A little help with bathing/dressing/bathroom;Assistance with Paramedic (measurements PT);BSC/3in1;Hospital bed;Wheelchair cushion (measurements PT);Rolling walker (2 wheels)    Recommendations for Other Services       Precautions / Restrictions Precautions Precautions: Fall Restrictions Weight Bearing Restrictions: No     Mobility  Bed Mobility Overal bed mobility: Needs Assistance Bed Mobility: Supine to Sit     Supine to sit: Min assist     General bed mobility comments: assistance for trunk support to sit upright. cues for technique    Transfers Overall transfer level: Needs assistance Equipment used: Rolling walker (2 wheels) Transfers: Sit to/from Stand Sit to Stand: Supervision           General transfer comment: 2 standing bouts at 30in elevated height of bed and chair. reinforcement of anterior weight shifting    Ambulation/Gait Ambulation/Gait assistance: Supervision Gait Distance (Feet):  (148ft and 37ft) Assistive device: Rolling walker (2 wheels) Gait Pattern/deviations: Step-through pattern Gait velocity: decreased     General Gait Details: chair follow for safety. 1 seated rest break required with ambulation. cues for standing rest break as needed   Stairs             Wheelchair Mobility    Modified Rankin (Stroke Patients Only)       Balance Overall balance assessment: Needs assistance Sitting-balance support: Feet supported Sitting balance-[name] Scale: Normal     Standing balance support: Bilateral upper extremity supported Standing balance-[name] Scale: Fair Standing balance comment: heavy reliance on rolling walker for support in standing                            Cognition Arousal/Alertness: Awake/alert Behavior During Therapy: [facility] for tasks  assessed/performed Overall Cognitive Status: Within Functional Limits for tasks assessed                                          Exercises      General Comments General comments (skin  integrity, edema, etc.): Sp02 in the low 90's after walking on 4 L02 via trach collar      Pertinent Vitals/Pain Pain Assessment Pain Assessment: No/denies pain    Home Living                          Prior Function            PT Goals (current goals can now be found in the care plan section) Acute Rehab PT Goals Patient Stated Goal: to walk and go home PT Goal Formulation: With patient Time For Goal Achievement: 07/19/22 Potential to Achieve Goals: Fair Progress towards PT goals: Progressing toward goals    Frequency    Min 2X/week      PT Plan Current plan remains appropriate    Co-evaluation              AM-PAC PT "6 Clicks" Mobility   Outcome Measure  Help needed turning from your back to your side while in a flat bed without using bedrails?: A Lot Help needed moving from lying on your back to sitting on the side of a flat bed without using bedrails?: A Lot Help needed moving to and from a bed to a chair (including a wheelchair)?: A Lot Help needed standing up from a chair using your arms (e.g., wheelchair or bedside chair)?: Total Help needed to walk in hospital room?: Total Help needed climbing 3-5 steps with a railing? : Total 6 Click Score: 9    End of Session Equipment Utilized During Treatment: Oxygen Activity Tolerance: Patient tolerated treatment well Patient left: in bed;with call bell/phone within reach Nurse Communication: Mobility status PT Visit Diagnosis: Unsteadiness on feet (R26.81);Muscle weakness (generalized) (M62.81);Difficulty in walking, not elsewhere classified (R26.2)     Time: [id number] PT Time Calculation (min) (ACUTE ONLY): 38 min  Charges:  $Gait Training: 8-22 mins $Therapeutic Activity: 23-37 mins                     [name], PT, MPT   [name] 07/12/2022, 2:30 PM

## 2022-07-12 NOTE — Progress Notes (Signed)
PROGRESS NOTE    Gary [name]  [id number] DOB: 02-03-1956 DOA: 04/26/2022 PCP: [facility]   Brief Narrative:   67 y.o. male with medical history significant of HFpEF with EF of 55-60%, chronic respiratory failure s/p tracheostomy, COPD, hypertension, OSA, hypertension, who presents to the ED due to abdominal pain.  Patient admits of increasing productive cough and rhinorrhea over the past couple of weeks.  Patient has been in the ED for placement for over the last 2 months but while waiting in the ER became hypoxic.  CTA chest showed evidence of bronchial wall thickening concerning for bronchitis but no evidence of PE.  Patient was given dose of IV Lasix due to signs of volume overload and bronchodilators.  Admitted to medical service.  Upon admission COVID/flu/RSV and respiratory panel were negative.   Assessment & Plan:  Principal Problem:   Acute on chronic hypoxic respiratory failure ([name]) Active Problems:   (HFpEF) heart failure with preserved ejection fraction ([name])   Obstructive sleep apnea   Chronic kidney disease, stage 3a ([name])   Anxiety and depression   Iron deficiency anemia     Assessment and Plan: * Acute on chronic hypoxic respiratory failure ([name]) Acute bronchitis/COPD exacerbation -Patient was noted to be hypoxic in the ED therefore being admitted and has increasing secretions from his tracheostomy.  Procalcitonin and BNP are negative.  COVID/flu and respiratory panel are negative. - CTA chest negative for pulmonary embolism. - Continue bronchodilators  (HFpEF) heart failure with preserved ejection fraction ([name]) -Does not appear to be in any acute exacerbation.  Did receive 1 dose of IV Lasix.  His BMP is unremarkable.  Now on home oral torsemide  Obstructive sleep apnea - Continue home trilogy at bedtime  Chronic kidney disease, stage 3a ([place]) Renal function currently at baseline.   Anxiety and depression - Continue home SSRI and  Atarax  Iron deficiency anemia Hemoglobin currently at baseline. - Continue home iron supplementation.  Bowel regimen as needed    DVT prophylaxis: Eliquis Code Status: Full  Family Communication:   TOC working on placement  Subjective: Seen and examined at bedside.  No complaints Down to his baseline oxygen of 2 L/trach collar Examination: Constitutional: Not in acute distress Respiratory: Clear to auscultation bilaterally Cardiovascular: Normal sinus rhythm, no rubs Abdomen: Nontender nondistended good bowel sounds Musculoskeletal: No edema noted Skin: No rashes seen Neurologic: CN 2-12 grossly intact.  And nonfocal Psychiatric: Normal judgment and insight. Alert and oriented x 3. Normal mood.  Tracheostomy in place External catheter in place  Objective: Vitals:   07/12/22 0015 07/12/22 0515 07/12/22 0717 07/12/22 0850  BP: (!) 129/91 (!) 131/94  123/80  Pulse: 82 74 76 85  Resp: 20 18  18   Temp: 98.2 F (36.8 C) 98.2 F (36.8 C)  98.2 F (36.8 C)  TempSrc:      SpO2: 97% 96% 94% 96%  Weight:      Height:        Intake/Output Summary (Last 24 hours) at 07/12/2022 0901 Last data filed at 07/12/2022 0856 Gross per 24 hour  Intake 720 ml  Output 3500 ml  Net -2780 ml   Filed Weights   05/04/22 0833 05/04/22 1318 07/11/22 1643  Weight: (!) 162.3 kg (!) 177.4 kg (!) 154 kg     Data Reviewed:   CBC: Recent Labs  Lab 07/09/22 0950 07/10/22 0810 07/11/22 0749 07/12/22 0655  WBC 5.9 6.8 6.8 6.4  NEUTROABS  --  4.6 5.3  --  HGB 10.5* 10.2* 10.6* 10.7*  HCT 39.3 37.8* 39.5 39.3  MCV 80.4 78.8* 79.5* 79.1*  PLT 241 252 245 [id number]   Basic Metabolic Panel: Recent Labs  Lab 07/10/22 0810 07/11/22 0749 07/12/22 0655  NA 137 139 139  K 3.5 4.0 3.9  CL 100 99 98  CO2 32 33* 33*  GLUCOSE 118* 132* 91  BUN 28* 31* 37*  CREATININE 1.38* 1.33* 1.39*  CALCIUM 8.2* 8.5* 8.3*  MG  --  2.3 2.2   GFR: Estimated Creatinine Clearance: 84.2 mL/min (A) (by  C-G formula based on SCr of 1.39 mg/dL (H)). Liver Function Tests: Recent Labs  Lab 07/10/22 0810  AST 14*  ALT 11  ALKPHOS 104  BILITOT 0.4  PROT 7.9  ALBUMIN 3.0*   No results for input(s): "LIPASE", "AMYLASE" in the last 168 hours. No results for input(s): "AMMONIA" in the last 168 hours. Coagulation Profile: No results for input(s): "INR", "PROTIME" in the last 168 hours. Cardiac Enzymes: No results for input(s): "CKTOTAL", "CKMB", "CKMBINDEX", "TROPONINI" in the last 168 hours. BNP (last 3 results) No results for input(s): "PROBNP" in the last 8760 hours. HbA1C: No results for input(s): "HGBA1C" in the last 72 hours. CBG: No results for input(s): "GLUCAP" in the last 168 hours. Lipid Profile: No results for input(s): "CHOL", "HDL", "LDLCALC", "TRIG", "CHOLHDL", "LDLDIRECT" in the last 72 hours. Thyroid Function Tests: No results for input(s): "TSH", "T4TOTAL", "FREET4", "T3FREE", "THYROIDAB" in the last 72 hours. Anemia Panel: No results for input(s): "VITAMINB12", "FOLATE", "FERRITIN", "TIBC", "IRON", "RETICCTPCT" in the last 72 hours. Sepsis Labs: Recent Labs  Lab 07/10/22 0808 07/10/22 0810  PROCALCITON <0.10  --   LATICACIDVEN  --  1.7    Recent Results (from the past 240 hour(s))  Resp panel by RT-PCR (RSV, Flu A&B, Covid) Anterior Nasal Swab     Status: None   Collection Time: 07/10/22  7:20 AM   Specimen: Anterior Nasal Swab  Result Value Ref Range Status   SARS Coronavirus 2 by RT PCR NEGATIVE NEGATIVE Final    Comment: (NOTE) SARS-CoV-2 target nucleic acids are NOT DETECTED.  The SARS-CoV-2 RNA is generally detectable in upper respiratory specimens during the acute phase of infection. The lowest concentration of SARS-CoV-2 viral copies this assay can detect is 138 copies/mL. A negative result does not preclude SARS-Cov-2 infection and should not be used as the sole basis for treatment or other patient management decisions. A negative result may occur  with  improper specimen collection/handling, submission of specimen other than nasopharyngeal swab, presence of viral mutation(s) within the areas targeted by this assay, and inadequate number of viral copies(<138 copies/mL). A negative result must be combined with clinical observations, patient history, and epidemiological information. The expected result is Negative.  Fact Sheet for Patients:  EntrepreneurPulse.com.au  Fact Sheet for Healthcare Providers:  IncredibleEmployment.be  This test is no t yet approved or cleared by the [place] FDA and  has been authorized for detection and/or diagnosis of SARS-CoV-2 by FDA under an Emergency Use Authorization (EUA). This EUA will remain  in effect (meaning this test can be used) for the duration of the COVID-19 declaration under Section 564(b)(1) of the Act, 21 U.S.C.section 360bbb-3(b)(1), unless the authorization is terminated  or revoked sooner.       Influenza A by PCR NEGATIVE NEGATIVE Final   Influenza B by PCR NEGATIVE NEGATIVE Final    Comment: (NOTE) The Xpert Xpress SARS-CoV-2/FLU/RSV plus assay is intended as an aid in the  diagnosis of influenza from Nasopharyngeal swab specimens and should not be used as a sole basis for treatment. Nasal washings and aspirates are unacceptable for Xpert Xpress SARS-CoV-2/FLU/RSV testing.  Fact Sheet for Patients: EntrepreneurPulse.com.au  Fact Sheet for Healthcare Providers: IncredibleEmployment.be  This test is not yet approved or cleared by the [place] FDA and has been authorized for detection and/or diagnosis of SARS-CoV-2 by FDA under an Emergency Use Authorization (EUA). This EUA will remain in effect (meaning this test can be used) for the duration of the COVID-19 declaration under Section 564(b)(1) of the Act, 21 U.S.C. section 360bbb-3(b)(1), unless the authorization is terminated  or revoked.     Resp Syncytial Virus by PCR NEGATIVE NEGATIVE Final    Comment: (NOTE) Fact Sheet for Patients: EntrepreneurPulse.com.au  Fact Sheet for Healthcare Providers: IncredibleEmployment.be  This test is not yet approved or cleared by the [place] FDA and has been authorized for detection and/or diagnosis of SARS-CoV-2 by FDA under an Emergency Use Authorization (EUA). This EUA will remain in effect (meaning this test can be used) for the duration of the COVID-19 declaration under Section 564(b)(1) of the Act, 21 U.S.C. section 360bbb-3(b)(1), unless the authorization is terminated or revoked.  Performed at [facility], [address] [place], [place] [address]   Culture, blood (routine x 2)     Status: None (Preliminary result)   Collection Time: 07/10/22  8:10 AM   Specimen: BLOOD  Result Value Ref Range Status   Specimen Description BLOOD RIGHT [facility]  Final   Special Requests   Final    BOTTLES DRAWN AEROBIC AND ANAEROBIC Blood Culture results may not be optimal due to an excessive volume of blood received in culture bottles   Culture   Final    NO GROWTH 2 DAYS Performed at [facility], [address], [place], [place] [address]    Report Status PENDING  Incomplete  Culture, blood (routine x 2)     Status: None (Preliminary result)   Collection Time: 07/10/22  7:55 PM   Specimen: BLOOD  Result Value Ref Range Status   Specimen Description BLOOD BLOOD LEFT HAND  Final   Special Requests   Final    BOTTLES DRAWN AEROBIC ONLY Blood Culture adequate volume   Culture   Final    NO GROWTH 2 DAYS Performed at [facility], [address], [place], [place] [address]    Report Status PENDING  Incomplete  Respiratory (~20 pathogens) panel by PCR     Status: None   Collection Time: 07/11/22 10:40 AM   Specimen: Nasopharyngeal Swab; Respiratory  Result Value Ref Range Status   Adenovirus NOT  DETECTED NOT DETECTED Final   Coronavirus 229E NOT DETECTED NOT DETECTED Final    Comment: (NOTE) The Coronavirus on the Respiratory Panel, DOES NOT test for the novel  Coronavirus (2019 nCoV)    Coronavirus HKU1 NOT DETECTED NOT DETECTED Final   Coronavirus NL63 NOT DETECTED NOT DETECTED Final   Coronavirus OC43 NOT DETECTED NOT DETECTED Final   Metapneumovirus NOT DETECTED NOT DETECTED Final   Rhinovirus / Enterovirus NOT DETECTED NOT DETECTED Final   Influenza A NOT DETECTED NOT DETECTED Final   Influenza B NOT DETECTED NOT DETECTED Final   Parainfluenza Virus 1 NOT DETECTED NOT DETECTED Final   Parainfluenza Virus 2 NOT DETECTED NOT DETECTED Final   Parainfluenza Virus 3 NOT DETECTED NOT DETECTED Final   Parainfluenza Virus 4 NOT DETECTED NOT DETECTED Final   Respiratory  Syncytial Virus NOT DETECTED NOT DETECTED Final   Bordetella pertussis NOT DETECTED NOT DETECTED Final   Bordetella Parapertussis NOT DETECTED NOT DETECTED Final   Chlamydophila pneumoniae NOT DETECTED NOT DETECTED Final   Mycoplasma pneumoniae NOT DETECTED NOT DETECTED Final    Comment: Performed at [facility], [place] [address] [place], [place] [address]         Radiology Studies: CT Angio Chest PE W and/or Wo Contrast  Result Date: 07/10/2022 CLINICAL DATA:  Hypoxic, PE suspected EXAM: CT ANGIOGRAPHY CHEST WITH CONTRAST TECHNIQUE: Multidetector CT imaging of the chest was performed using the standard protocol during bolus administration of intravenous contrast. Multiplanar CT image reconstructions and MIPs were obtained to evaluate the vascular anatomy. RADIATION DOSE REDUCTION: This exam was performed according to the departmental dose-optimization program which includes automated exposure control, adjustment of the mA and/or kV according to patient size and/or use of iterative reconstruction technique. CONTRAST:  151mL OMNIPAQUE IOHEXOL 350 MG/ML SOLN COMPARISON:  07/20/2021 CTA chest, correlation is  also made with 10/06/2021 CT chest FINDINGS: Cardiovascular: Evaluation is limited by bolus timing, as more contrast is in the pulmonary veins and aorta than in the pulmonary arteries. Within this limitation, no evidence of pulmonary embolism to the level of lobar arteries. The main pulmonary artery is enlarged, measuring to 3.7 cm. Cardiomegaly. No pericardial effusion. Mediastinum/Nodes: Tracheostomy tube. No enlarged mediastinal, hilar, or axillary lymph nodes. Thyroid gland, trachea, and esophagus demonstrate no significant findings. Lungs/Pleura: Linear opacities, likely atelectasis and/or scarring. Bronchial wall thickening. No pleural effusion or pneumothorax. Upper Abdomen: No acute abnormality. Musculoskeletal: No chest wall abnormality. No acute or significant osseous findings. Remote left rib fractures. Review of the MIP images confirms the above findings. IMPRESSION: 1. Evaluation is limited by bolus timing, as more contrast is in the pulmonary veins and aorta than in the pulmonary arteries. Within this limitation, no evidence of pulmonary embolism to the level of lobar arteries. 2. Bronchial wall thickening, as can be seen with nonspecific infectious or inflammatory bronchitis. 3. Cardiomegaly. 4. The main pulmonary artery is enlarged, measuring to 3.7 cm, as can be seen in pulmonary hypertension. Electronically Signed   By: Merilyn Baba M.D.   On: 07/10/2022 12:16   US Venous Img Lower Bilateral  Result Date: 07/10/2022 CLINICAL DATA:  Pain and edema, hypoxia EXAM: BILATERAL LOWER EXTREMITY VENOUS DOPPLER ULTRASOUND TECHNIQUE: Gray-scale sonography with graded compression, as well as color Doppler and duplex ultrasound were performed to evaluate the lower extremity deep venous systems from the level of the common femoral vein and including the common femoral, femoral, profunda femoral, popliteal and calf veins including the posterior tibial, peroneal and gastrocnemius veins when visible.Spectral  Doppler was utilized to evaluate flow at rest and with distal augmentation maneuvers in the common femoral, femoral and popliteal veins. COMPARISON:  None Available. FINDINGS: RIGHT LOWER EXTREMITY Common Femoral Vein: No evidence of thrombus. Normal compressibility, respiratory phasicity and response to augmentation. Saphenofemoral Junction: No evidence of thrombus. Normal compressibility and flow on color Doppler imaging. Profunda Femoral Vein: No evidence of thrombus. Normal compressibility and flow on color Doppler imaging. Femoral Vein: No evidence of thrombus. Normal compressibility, respiratory phasicity and response to augmentation. Popliteal Vein: No evidence of thrombus. Normal compressibility, respiratory phasicity and response to augmentation. Calf Veins: Limited visualization.  No gross thrombus appreciated. LEFT LOWER EXTREMITY Common Femoral Vein: No evidence of thrombus. Normal compressibility, respiratory phasicity and response to augmentation. Saphenofemoral Junction: No evidence of thrombus. Normal compressibility and flow  on color Doppler imaging. Profunda Femoral Vein: No evidence of thrombus. Normal compressibility and flow on color Doppler imaging. Femoral Vein: No evidence of thrombus. Normal compressibility, respiratory phasicity and response to augmentation. Popliteal Vein: No evidence of thrombus. Normal compressibility, respiratory phasicity and response to augmentation. Calf Veins: Limited visualization.  No gross thrombus appreciated. IMPRESSION: No evidence of significant deep venous thrombosis in either lower extremity. Electronically Signed   By: Jerilynn Mages.  Shick M.D.   On: 07/10/2022 10:47        Scheduled Meds:  apixaban  2.5 mg Oral BID   budesonide (PULMICORT) nebulizer solution  0.5 mg Nebulization BID   escitalopram  5 mg Oral Daily   ferrous sulfate  325 mg Oral Q breakfast   guaiFENesin  600 mg Oral BID   ipratropium-albuterol  3 mL Nebulization BID   melatonin  5 mg  Oral QHS   pantoprazole  40 mg Oral BID AC   predniSONE  40 mg Oral Q breakfast   sodium chloride flush  3 mL Intravenous Q12H   torsemide  20 mg Oral BID   Continuous Infusions:   LOS: 0 days   Time spent= 35 mins    Eden Toohey Arsenio Loader, MD [facility]  If 7PM-7AM, please contact night-coverage  07/12/2022, 9:01 AM

## 2022-07-13 DIAGNOSIS — J9621 Acute and chronic respiratory failure with hypoxia: Secondary | ICD-10-CM | POA: Diagnosis not present

## 2022-07-13 LAB — BASIC METABOLIC PANEL
Anion gap: 8 (ref 5–15)
BUN: 43 mg/dL — ABNORMAL HIGH (ref 8–23)
CO2: 33 mmol/L — ABNORMAL HIGH (ref 22–32)
Calcium: 8.1 mg/dL — ABNORMAL LOW (ref 8.9–10.3)
Chloride: 98 mmol/L (ref 98–111)
Creatinine, Ser: 1.46 mg/dL — ABNORMAL HIGH (ref 0.61–1.24)
GFR, Estimated: 53 mL/min — ABNORMAL LOW (ref >60)
Glucose, Bld: 123 mg/dL — ABNORMAL HIGH (ref 70–99)
Potassium: 3.9 mmol/L (ref 3.5–5.1)
Sodium: 139 mmol/L (ref 135–145)

## 2022-07-13 LAB — CBC
HCT: 38.7 % — ABNORMAL LOW (ref 39.0–52.0)
Hemoglobin: 10.5 g/dL — ABNORMAL LOW (ref 13.0–17.0)
MCH: 21.4 pg — ABNORMAL LOW (ref 26.0–34.0)
MCHC: 27.1 g/dL — ABNORMAL LOW (ref 30.0–36.0)
MCV: 79 fL — ABNORMAL LOW (ref 80.0–100.0)
Platelets: 245 10*3/uL (ref 150–400)
RBC: 4.9 MIL/uL (ref 4.22–5.81)
RDW: 17.2 % — ABNORMAL HIGH (ref 11.5–15.5)
WBC: 6.4 10*3/uL (ref 4.0–10.5)
nRBC: 0 % (ref 0.0–0.2)

## 2022-07-13 LAB — MAGNESIUM: Magnesium: 2.3 mg/dL (ref 1.7–2.4)

## 2022-07-13 NOTE — Progress Notes (Signed)
PROGRESS NOTE    Gary [name]  [id number] DOB: 1955/05/13 DOA: 04/26/2022 PCP: [facility] [place]   Brief Narrative:   67 y.o. male with medical history significant of HFpEF with EF of 55-60%, chronic respiratory failure s/p tracheostomy, COPD, hypertension, OSA, hypertension, who presents to the ED due to abdominal pain.  Patient admits of increasing productive cough and rhinorrhea over the past couple of weeks.  Patient has been in the ED for placement for over the last 2 months but while waiting in the ER became hypoxic.  CTA chest showed evidence of bronchial wall thickening concerning for bronchitis but no evidence of PE.  Patient was given dose of IV Lasix due to signs of volume overload and bronchodilators.  Admitted to medical service.  Upon admission COVID/flu/RSV and respiratory panel were negative.   Assessment & Plan:  Principal Problem:   Acute on chronic hypoxic respiratory failure ([name]) Active Problems:   (HFpEF) heart failure with preserved ejection fraction ([name])   Obstructive sleep apnea   Chronic kidney disease, stage 3a ([name])   Anxiety and depression   Iron deficiency anemia     Assessment and Plan: * Acute on chronic hypoxic respiratory failure ([place]); stable.  Acute bronchitis/COPD exacerbation; stable.  -Patient was noted to be hypoxic in the ED therefore being admitted and has increasing secretions from his tracheostomy.  Procalcitonin and BNP are negative.  COVID/flu and respiratory panel are negative. - CTA chest negative for pulmonary embolism. - Continue bronchodilators  (HFpEF) heart failure with preserved ejection fraction ([name]) -Does not appear to be in any acute exacerbation.  Did receive 1 dose of IV Lasix. Now on daily home torsemide.   Obstructive sleep apnea - Continue home trilogy at bedtime  Chronic kidney disease, stage 3a ([place]) Renal function currently at baseline.   Anxiety and depression - Continue home SSRI and  Atarax  Iron deficiency anemia Hemoglobin currently at baseline. - Continue home iron supplementation.  Bowel regimen as needed    DVT prophylaxis: Eliquis Code Status: Full  Family Communication:   TOC working on placement  Subjective: No events overnight.  Continues to say he wants to go home Examination: Constitutional: Not in acute distress Respiratory: Clear to auscultation bilaterally Cardiovascular: Normal sinus rhythm, no rubs Abdomen: Nontender nondistended good bowel sounds Musculoskeletal: No edema noted Skin: No rashes seen Neurologic: CN 2-12 grossly intact.  And nonfocal Psychiatric: Normal judgment and insight. Alert and oriented x 3. Normal mood.  Tracheostomy in place External catheter in place  Objective: Vitals:   07/12/22 1558 07/12/22 1955 07/12/22 2350 07/13/22 0346  BP: (!) 127/92 131/88 (!) 142/81 119/85  Pulse: 82 85 85 78  Resp: (!) 22 20 20 20   Temp: 98.2 F (36.8 C) 98.4 F (36.9 C) 98.7 F (37.1 C) 98 F (36.7 C)  TempSrc:  Temporal Oral Oral  SpO2: 94% 98% 96% 98%  Weight:      Height:        Intake/Output Summary (Last 24 hours) at 07/13/2022 0839 Last data filed at 07/13/2022 0346 Gross per 24 hour  Intake --  Output 2650 ml  Net -2650 ml   Filed Weights   05/04/22 1318 07/11/22 1643 07/12/22 1300  Weight: (!) 177.4 kg (!) 154 kg (!) 185.1 kg     Data Reviewed:   CBC: Recent Labs  Lab 07/09/22 0950 07/10/22 0810 07/11/22 0749 07/12/22 0655  WBC 5.9 6.8 6.8 6.4  NEUTROABS  --  4.6 5.3  --  HGB 10.5* 10.2* 10.6* 10.7*  HCT 39.3 37.8* 39.5 39.3  MCV 80.4 78.8* 79.5* 79.1*  PLT 241 252 245 [id number]   Basic Metabolic Panel: Recent Labs  Lab 07/10/22 0810 07/11/22 0749 07/12/22 0655  NA 137 139 139  K 3.5 4.0 3.9  CL 100 99 98  CO2 32 33* 33*  GLUCOSE 118* 132* 91  BUN 28* 31* 37*  CREATININE 1.38* 1.33* 1.39*  CALCIUM 8.2* 8.5* 8.3*  MG  --  2.3 2.2   GFR: Estimated Creatinine Clearance: 93.5 mL/min (A)  (by C-G formula based on SCr of 1.39 mg/dL (H)). Liver Function Tests: Recent Labs  Lab 07/10/22 0810  AST 14*  ALT 11  ALKPHOS 104  BILITOT 0.4  PROT 7.9  ALBUMIN 3.0*   No results for input(s): "LIPASE", "AMYLASE" in the last 168 hours. No results for input(s): "AMMONIA" in the last 168 hours. Coagulation Profile: No results for input(s): "INR", "PROTIME" in the last 168 hours. Cardiac Enzymes: No results for input(s): "CKTOTAL", "CKMB", "CKMBINDEX", "TROPONINI" in the last 168 hours. BNP (last 3 results) No results for input(s): "PROBNP" in the last 8760 hours. HbA1C: No results for input(s): "HGBA1C" in the last 72 hours. CBG: No results for input(s): "GLUCAP" in the last 168 hours. Lipid Profile: No results for input(s): "CHOL", "HDL", "LDLCALC", "TRIG", "CHOLHDL", "LDLDIRECT" in the last 72 hours. Thyroid Function Tests: No results for input(s): "TSH", "T4TOTAL", "FREET4", "T3FREE", "THYROIDAB" in the last 72 hours. Anemia Panel: No results for input(s): "VITAMINB12", "FOLATE", "FERRITIN", "TIBC", "IRON", "RETICCTPCT" in the last 72 hours. Sepsis Labs: Recent Labs  Lab 07/10/22 0808 07/10/22 0810  PROCALCITON <0.10  --   LATICACIDVEN  --  1.7    Recent Results (from the past 240 hour(s))  Resp panel by RT-PCR (RSV, Flu A&B, Covid) Anterior Nasal Swab     Status: None   Collection Time: 07/10/22  7:20 AM   Specimen: Anterior Nasal Swab  Result Value Ref Range Status   SARS Coronavirus 2 by RT PCR NEGATIVE NEGATIVE Final    Comment: (NOTE) SARS-CoV-2 target nucleic acids are NOT DETECTED.  The SARS-CoV-2 RNA is generally detectable in upper respiratory specimens during the acute phase of infection. The lowest concentration of SARS-CoV-2 viral copies this assay can detect is 138 copies/mL. A negative result does not preclude SARS-Cov-2 infection and should not be used as the sole basis for treatment or other patient management decisions. A negative result may  occur with  improper specimen collection/handling, submission of specimen other than nasopharyngeal swab, presence of viral mutation(s) within the areas targeted by this assay, and inadequate number of viral copies(<138 copies/mL). A negative result must be combined with clinical observations, patient history, and epidemiological information. The expected result is Negative.  Fact Sheet for Patients:  EntrepreneurPulse.com.au  Fact Sheet for Healthcare Providers:  IncredibleEmployment.be  This test is no t yet approved or cleared by the [place] FDA and  has been authorized for detection and/or diagnosis of SARS-CoV-2 by FDA under an Emergency Use Authorization (EUA). This EUA will remain  in effect (meaning this test can be used) for the duration of the COVID-19 declaration under Section 564(b)(1) of the Act, 21 U.S.C.section 360bbb-3(b)(1), unless the authorization is terminated  or revoked sooner.       Influenza A by PCR NEGATIVE NEGATIVE Final   Influenza B by PCR NEGATIVE NEGATIVE Final    Comment: (NOTE) The Xpert Xpress SARS-CoV-2/FLU/RSV plus assay is intended as an aid in the  diagnosis of influenza from Nasopharyngeal swab specimens and should not be used as a sole basis for treatment. Nasal washings and aspirates are unacceptable for Xpert Xpress SARS-CoV-2/FLU/RSV testing.  Fact Sheet for Patients: EntrepreneurPulse.com.au  Fact Sheet for Healthcare Providers: IncredibleEmployment.be  This test is not yet approved or cleared by the [place] FDA and has been authorized for detection and/or diagnosis of SARS-CoV-2 by FDA under an Emergency Use Authorization (EUA). This EUA will remain in effect (meaning this test can be used) for the duration of the COVID-19 declaration under Section 564(b)(1) of the Act, 21 U.S.C. section 360bbb-3(b)(1), unless the authorization is terminated  or revoked.     Resp Syncytial Virus by PCR NEGATIVE NEGATIVE Final    Comment: (NOTE) Fact Sheet for Patients: EntrepreneurPulse.com.au  Fact Sheet for Healthcare Providers: IncredibleEmployment.be  This test is not yet approved or cleared by the [place] FDA and has been authorized for detection and/or diagnosis of SARS-CoV-2 by FDA under an Emergency Use Authorization (EUA). This EUA will remain in effect (meaning this test can be used) for the duration of the COVID-19 declaration under Section 564(b)(1) of the Act, 21 U.S.C. section 360bbb-3(b)(1), unless the authorization is terminated or revoked.  Performed at [facility], [address] [place], [place] [address]   Culture, blood (routine x 2)     Status: None (Preliminary result)   Collection Time: 07/10/22  8:10 AM   Specimen: BLOOD  Result Value Ref Range Status   Specimen Description BLOOD RIGHT [facility]  Final   Special Requests   Final    BOTTLES DRAWN AEROBIC AND ANAEROBIC Blood Culture results may not be optimal due to an excessive volume of blood received in culture bottles   Culture   Final    NO GROWTH 3 DAYS Performed at [facility] [address], [place], [place] [address]    Report Status PENDING  Incomplete  Culture, blood (routine x 2)     Status: None (Preliminary result)   Collection Time: 07/10/22  7:55 PM   Specimen: BLOOD  Result Value Ref Range Status   Specimen Description BLOOD BLOOD LEFT HAND  Final   Special Requests   Final    BOTTLES DRAWN AEROBIC ONLY Blood Culture adequate volume   Culture   Final    NO GROWTH 3 DAYS Performed at [facility], [address] [place], [place] [address]    Report Status PENDING  Incomplete  Respiratory (~20 pathogens) panel by PCR     Status: None   Collection Time: 07/11/22 10:40 AM   Specimen: Nasopharyngeal Swab; Respiratory  Result Value Ref Range Status   Adenovirus NOT  DETECTED NOT DETECTED Final   Coronavirus 229E NOT DETECTED NOT DETECTED Final    Comment: (NOTE) The Coronavirus on the Respiratory Panel, DOES NOT test for the novel  Coronavirus (2019 nCoV)    Coronavirus HKU1 NOT DETECTED NOT DETECTED Final   Coronavirus NL63 NOT DETECTED NOT DETECTED Final   Coronavirus OC43 NOT DETECTED NOT DETECTED Final   Metapneumovirus NOT DETECTED NOT DETECTED Final   Rhinovirus / Enterovirus NOT DETECTED NOT DETECTED Final   Influenza A NOT DETECTED NOT DETECTED Final   Influenza B NOT DETECTED NOT DETECTED Final   Parainfluenza Virus 1 NOT DETECTED NOT DETECTED Final   Parainfluenza Virus 2 NOT DETECTED NOT DETECTED Final   Parainfluenza Virus 3 NOT DETECTED NOT DETECTED Final   Parainfluenza Virus 4 NOT DETECTED NOT DETECTED Final   Respiratory  Syncytial Virus NOT DETECTED NOT DETECTED Final   Bordetella pertussis NOT DETECTED NOT DETECTED Final   Bordetella Parapertussis NOT DETECTED NOT DETECTED Final   Chlamydophila pneumoniae NOT DETECTED NOT DETECTED Final   Mycoplasma pneumoniae NOT DETECTED NOT DETECTED Final    Comment: Performed at [facility], [place] [address], [place], [place] [address]         Radiology Studies: No results found.      Scheduled Meds:  apixaban  2.5 mg Oral BID   budesonide (PULMICORT) nebulizer solution  0.5 mg Nebulization BID   escitalopram  5 mg Oral Daily   ferrous sulfate  325 mg Oral Q breakfast   guaiFENesin  600 mg Oral BID   ipratropium-albuterol  3 mL Nebulization BID   melatonin  5 mg Oral QHS   pantoprazole  40 mg Oral BID AC   predniSONE  40 mg Oral Q breakfast   sodium chloride flush  3 mL Intravenous Q12H   torsemide  20 mg Oral BID   Continuous Infusions:   LOS: 0 days   Time spent= 35 mins    Theda Payer Arsenio Loader, MD [facility]  If 7PM-7AM, please contact night-coverage  07/13/2022, 8:39 AM

## 2022-07-13 NOTE — Progress Notes (Signed)
OT Cancellation Note  Patient Details Name: Gary [name] MRN: [id number] DOB: 1955/12/25   Cancelled Treatment:    Reason Eval/Treat Not Completed: Patient declined, no reason specified. Pt pleasant refuses session this date stating feeling short of breath, SpO2 88% -90% on trach collar. Reports plan to complete bed level therex independently. Will re-attempt as able.   [name], M.S. OTR/L  07/13/22, 3:03 PM  ascom [phone]

## 2022-07-14 DIAGNOSIS — J9621 Acute and chronic respiratory failure with hypoxia: Secondary | ICD-10-CM | POA: Diagnosis not present

## 2022-07-14 LAB — CBC
HCT: 39 % (ref 39.0–52.0)
Hemoglobin: 10.5 g/dL — ABNORMAL LOW (ref 13.0–17.0)
MCH: 21.2 pg — ABNORMAL LOW (ref 26.0–34.0)
MCHC: 26.9 g/dL — ABNORMAL LOW (ref 30.0–36.0)
MCV: 78.6 fL — ABNORMAL LOW (ref 80.0–100.0)
Platelets: 250 10*3/uL (ref 150–400)
RBC: 4.96 MIL/uL (ref 4.22–5.81)
RDW: 16.7 % — ABNORMAL HIGH (ref 11.5–15.5)
WBC: 6.4 10*3/uL (ref 4.0–10.5)
nRBC: 0.3 % — ABNORMAL HIGH (ref 0.0–0.2)

## 2022-07-14 LAB — BASIC METABOLIC PANEL
Anion gap: 13 (ref 5–15)
BUN: 46 mg/dL — ABNORMAL HIGH (ref 8–23)
CO2: 33 mmol/L — ABNORMAL HIGH (ref 22–32)
Calcium: 8.4 mg/dL — ABNORMAL LOW (ref 8.9–10.3)
Chloride: 94 mmol/L — ABNORMAL LOW (ref 98–111)
Creatinine, Ser: 1.5 mg/dL — ABNORMAL HIGH (ref 0.61–1.24)
GFR, Estimated: 51 mL/min — ABNORMAL LOW (ref >60)
Glucose, Bld: 136 mg/dL — ABNORMAL HIGH (ref 70–99)
Potassium: 4.1 mmol/L (ref 3.5–5.1)
Sodium: 140 mmol/L (ref 135–145)

## 2022-07-14 LAB — MAGNESIUM: Magnesium: 2.2 mg/dL (ref 1.7–2.4)

## 2022-07-14 NOTE — Progress Notes (Signed)
Pt taken off home vent and placed back on 35% ATC

## 2022-07-14 NOTE — Progress Notes (Signed)
Mobility Specialist - Progress Note     07/14/22 1650  Mobility  Activity Ambulated with assistance in room  Level of Assistance Standby assist, set-up cues, supervision of patient - no hands on  Assistive Device Front wheel walker  Distance Ambulated (ft) 10 ft  Activity Response Tolerated well  Mobility Referral Yes  $Mobility charge 1 Mobility   Pt resting in recliner on RA upon entry. Pt STS and ambulates around the end of bed to returned to EOB. Pt performed bed mobility ModI. Pt left in bed with needs in reach.    Loma Sender Mobility Specialist 07/14/22, 4:59 PM

## 2022-07-14 NOTE — Progress Notes (Signed)
Mobility Specialist - Progress Note   07/14/22 1413  Mobility  Activity Ambulated with assistance in hallway;Stood at bedside;Dangled on edge of bed  Level of Assistance Standby assist, set-up cues, supervision of patient - no hands on  Assistive Device Front wheel walker  Distance Ambulated (ft) 180 ft  Activity Response Tolerated well  Mobility Referral Yes  $Mobility charge 1 Mobility   Pt supine in bed on Trach upon arrival. Pt completes bed mobility with HHA. Pt needs MaxA to don shoes. Pt STS and ambulates 2 bouts in hallway (136ft and 31ft) SBA + 2 for chair follow with no LOB noted. Pt left in recliner with needs in reach.   Gretchen Short  Mobility Specialist  07/14/22 2:36 PM

## 2022-07-14 NOTE — Progress Notes (Signed)
PROGRESS NOTE    JADEIN THALMAN  [id number] DOB: [birth year] DOA: 04/26/2022 PCP: [facility]   Brief Narrative:   67 y.o. male with medical history significant of HFpEF with EF of 55-60%, chronic respiratory failure s/p tracheostomy, COPD, hypertension, OSA, hypertension, who presents to the ED due to abdominal pain.  Patient admits of increasing productive cough and rhinorrhea over the past couple of weeks.  Patient has been in the ED for placement for over the last 2 months but while waiting in the ER became hypoxic.  CTA chest showed evidence of bronchial wall thickening concerning for bronchitis but no evidence of PE.  Patient was given dose of IV Lasix due to signs of volume overload and bronchodilators.  Admitted to medical service.  Upon admission COVID/flu/RSV and respiratory panel were negative.  Slowly he was weaned down.  Currently awaiting placement.   Assessment & Plan:  Principal Problem:   Acute on chronic hypoxic respiratory failure ([name]) Active Problems:   (HFpEF) heart failure with preserved ejection fraction ([name])   Obstructive sleep apnea   Chronic kidney disease, stage 3a ([name])   Anxiety and depression   Iron deficiency anemia     Assessment and Plan: * Acute on chronic hypoxic respiratory failure ([place]); stable.  Acute bronchitis/COPD exacerbation; stable.  -Patient was noted to be hypoxic in the ED therefore being admitted and has increasing secretions from his tracheostomy.  Procalcitonin and BNP are negative.  COVID/flu and respiratory panel are negative. - CTA chest negative for pulmonary embolism. - As needed bronchodilators  (HFpEF) heart failure with preserved ejection fraction ([name]) -Does not appear to be in any acute exacerbation.  Did receive 1 dose of IV Lasix.  On home torsemide  Obstructive sleep apnea - Continue home trilogy at bedtime  Chronic kidney disease, stage 3a ([place]) Renal function currently at baseline.   Anxiety  and depression - Continue home SSRI and Atarax  Iron deficiency anemia Hemoglobin currently at baseline. - Continue home iron supplementation.  Bowel regimen as needed  Periodic lab work ordered PT/OT-SNF  DVT prophylaxis: Eliquis Code Status: Full  Family Communication:   TOC working on placement  Subjective: Continues to tell me he wants to go home.   Examination: Constitutional: Not in acute distress Respiratory: Clear to auscultation bilaterally Cardiovascular: Normal sinus rhythm, no rubs Abdomen: Nontender nondistended good bowel sounds Musculoskeletal: No edema noted Skin: No rashes seen Neurologic: CN 2-12 grossly intact.  And nonfocal Psychiatric: Normal judgment and insight. Alert and oriented x 3. Normal mood.    Tracheostomy in place External catheter in place  Objective: Vitals:   07/13/22 2158 07/14/22 0005 07/14/22 0346 07/14/22 0800  BP: 119/87  (!) 131/94   Pulse: 84  78   Resp: (!) 21  20   Temp: 98.3 F (36.8 C)  98.4 F (36.9 C)   TempSrc: Oral  Oral   SpO2: 100% 95% 96% 93%  Weight:      Height:        Intake/Output Summary (Last 24 hours) at 07/14/2022 0841 Last data filed at 07/14/2022 0600 Gross per 24 hour  Intake 480 ml  Output 2650 ml  Net -2170 ml   Filed Weights   05/04/22 1318 07/11/22 1643 07/12/22 1300  Weight: (!) 177.4 kg (!) 154 kg (!) 185.1 kg     Data Reviewed:   CBC: Recent Labs  Lab 07/10/22 0810 07/11/22 0749 07/12/22 0655 07/13/22 1024 07/14/22 0329  WBC 6.8 6.8 6.4 6.4  6.4  NEUTROABS 4.6 5.3  --   --   --   HGB 10.2* 10.6* 10.7* 10.5* 10.5*  HCT 37.8* 39.5 39.3 38.7* 39.0  MCV 78.8* 79.5* 79.1* 79.0* 78.6*  PLT 252 245 256 245 [id number]   Basic Metabolic Panel: Recent Labs  Lab 07/10/22 0810 07/11/22 0749 07/12/22 0655 07/13/22 1024 07/14/22 0329  NA 137 139 139 139 140  K 3.5 4.0 3.9 3.9 4.1  CL 100 99 98 98 94*  CO2 32 33* 33* 33* 33*  GLUCOSE 118* 132* 91 123* 136*  BUN 28* 31* 37* 43* 46*   CREATININE 1.38* 1.33* 1.39* 1.46* 1.50*  CALCIUM 8.2* 8.5* 8.3* 8.1* 8.4*  MG  --  2.3 2.2 2.3 2.2   GFR: Estimated Creatinine Clearance: 86.6 mL/min (A) (by C-G formula based on SCr of 1.5 mg/dL (H)). Liver Function Tests: Recent Labs  Lab 07/10/22 0810  AST 14*  ALT 11  ALKPHOS 104  BILITOT 0.4  PROT 7.9  ALBUMIN 3.0*   No results for input(s): "LIPASE", "AMYLASE" in the last 168 hours. No results for input(s): "AMMONIA" in the last 168 hours. Coagulation Profile: No results for input(s): "INR", "PROTIME" in the last 168 hours. Cardiac Enzymes: No results for input(s): "CKTOTAL", "CKMB", "CKMBINDEX", "TROPONINI" in the last 168 hours. BNP (last 3 results) No results for input(s): "PROBNP" in the last 8760 hours. HbA1C: No results for input(s): "HGBA1C" in the last 72 hours. CBG: No results for input(s): "GLUCAP" in the last 168 hours. Lipid Profile: No results for input(s): "CHOL", "HDL", "LDLCALC", "TRIG", "CHOLHDL", "LDLDIRECT" in the last 72 hours. Thyroid Function Tests: No results for input(s): "TSH", "T4TOTAL", "FREET4", "T3FREE", "THYROIDAB" in the last 72 hours. Anemia Panel: No results for input(s): "VITAMINB12", "FOLATE", "FERRITIN", "TIBC", "IRON", "RETICCTPCT" in the last 72 hours. Sepsis Labs: Recent Labs  Lab 07/10/22 0808 07/10/22 0810  PROCALCITON <0.10  --   LATICACIDVEN  --  1.7    Recent Results (from the past 240 hour(s))  Resp panel by RT-PCR (RSV, Flu A&B, Covid) Anterior Nasal Swab     Status: None   Collection Time: 07/10/22  7:20 AM   Specimen: Anterior Nasal Swab  Result Value Ref Range Status   SARS Coronavirus 2 by RT PCR NEGATIVE NEGATIVE Final    Comment: (NOTE) SARS-CoV-2 target nucleic acids are NOT DETECTED.  The SARS-CoV-2 RNA is generally detectable in upper respiratory specimens during the acute phase of infection. The lowest concentration of SARS-CoV-2 viral copies this assay can detect is 138 copies/mL. A negative  result does not preclude SARS-Cov-2 infection and should not be used as the sole basis for treatment or other patient management decisions. A negative result may occur with  improper specimen collection/handling, submission of specimen other than nasopharyngeal swab, presence of viral mutation(s) within the areas targeted by this assay, and inadequate number of viral copies(<138 copies/mL). A negative result must be combined with clinical observations, patient history, and epidemiological information. The expected result is Negative.  Fact Sheet for Patients:  EntrepreneurPulse.com.au  Fact Sheet for Healthcare Providers:  IncredibleEmployment.be  This test is no t yet approved or cleared by the [place] FDA and  has been authorized for detection and/or diagnosis of SARS-CoV-2 by FDA under an Emergency Use Authorization (EUA). This EUA will remain  in effect (meaning this test can be used) for the duration of the COVID-19 declaration under Section 564(b)(1) of the Act, 21 U.S.C.section 360bbb-3(b)(1), unless the authorization is terminated  or revoked  sooner.       Influenza A by PCR NEGATIVE NEGATIVE Final   Influenza B by PCR NEGATIVE NEGATIVE Final    Comment: (NOTE) The Xpert Xpress SARS-CoV-2/FLU/RSV plus assay is intended as an aid in the diagnosis of influenza from Nasopharyngeal swab specimens and should not be used as a sole basis for treatment. Nasal washings and aspirates are unacceptable for Xpert Xpress SARS-CoV-2/FLU/RSV testing.  Fact Sheet for Patients: EntrepreneurPulse.com.au  Fact Sheet for Healthcare Providers: IncredibleEmployment.be  This test is not yet approved or cleared by the [place] FDA and has been authorized for detection and/or diagnosis of SARS-CoV-2 by FDA under an Emergency Use Authorization (EUA). This EUA will remain in effect (meaning this test can be used)  for the duration of the COVID-19 declaration under Section 564(b)(1) of the Act, 21 U.S.C. section 360bbb-3(b)(1), unless the authorization is terminated or revoked.     Resp Syncytial Virus by PCR NEGATIVE NEGATIVE Final    Comment: (NOTE) Fact Sheet for Patients: EntrepreneurPulse.com.au  Fact Sheet for Healthcare Providers: IncredibleEmployment.be  This test is not yet approved or cleared by the [place] FDA and has been authorized for detection and/or diagnosis of SARS-CoV-2 by FDA under an Emergency Use Authorization (EUA). This EUA will remain in effect (meaning this test can be used) for the duration of the COVID-19 declaration under Section 564(b)(1) of the Act, 21 U.S.C. section 360bbb-3(b)(1), unless the authorization is terminated or revoked.  Performed at [facility], [address] [place], [place] [address]   Culture, blood (routine x 2)     Status: None (Preliminary result)   Collection Time: 07/10/22  8:10 AM   Specimen: BLOOD  Result Value Ref Range Status   Specimen Description BLOOD RIGHT [facility]  Final   Special Requests   Final    BOTTLES DRAWN AEROBIC AND ANAEROBIC Blood Culture results may not be optimal due to an excessive volume of blood received in culture bottles   Culture   Final    NO GROWTH 4 DAYS Performed at [facility], [address] [place], [place] [address]    Report Status PENDING  Incomplete  Culture, blood (routine x 2)     Status: None (Preliminary result)   Collection Time: 07/10/22  7:55 PM   Specimen: BLOOD  Result Value Ref Range Status   Specimen Description BLOOD BLOOD LEFT HAND  Final   Special Requests   Final    BOTTLES DRAWN AEROBIC ONLY Blood Culture adequate volume   Culture   Final    NO GROWTH 4 DAYS Performed at [facility], [address] [place], [place] [address]    Report Status PENDING  Incomplete  Respiratory (~20 pathogens) panel by PCR      Status: None   Collection Time: 07/11/22 10:40 AM   Specimen: Nasopharyngeal Swab; Respiratory  Result Value Ref Range Status   Adenovirus NOT DETECTED NOT DETECTED Final   Coronavirus 229E NOT DETECTED NOT DETECTED Final    Comment: (NOTE) The Coronavirus on the Respiratory Panel, DOES NOT test for the novel  Coronavirus (2019 nCoV)    Coronavirus HKU1 NOT DETECTED NOT DETECTED Final   Coronavirus NL63 NOT DETECTED NOT DETECTED Final   Coronavirus OC43 NOT DETECTED NOT DETECTED Final   Metapneumovirus NOT DETECTED NOT DETECTED Final   Rhinovirus / Enterovirus NOT DETECTED NOT DETECTED Final   Influenza A NOT DETECTED NOT DETECTED Final   Influenza B NOT DETECTED NOT DETECTED Final  Parainfluenza Virus 1 NOT DETECTED NOT DETECTED Final   Parainfluenza Virus 2 NOT DETECTED NOT DETECTED Final   Parainfluenza Virus 3 NOT DETECTED NOT DETECTED Final   Parainfluenza Virus 4 NOT DETECTED NOT DETECTED Final   Respiratory Syncytial Virus NOT DETECTED NOT DETECTED Final   Bordetella pertussis NOT DETECTED NOT DETECTED Final   Bordetella Parapertussis NOT DETECTED NOT DETECTED Final   Chlamydophila pneumoniae NOT DETECTED NOT DETECTED Final   Mycoplasma pneumoniae NOT DETECTED NOT DETECTED Final    Comment: Performed at [facility], [place] [address], [place], [place] [address]         Radiology Studies: No results found.      Scheduled Meds:  apixaban  2.5 mg Oral BID   budesonide (PULMICORT) nebulizer solution  0.5 mg Nebulization BID   escitalopram  5 mg Oral Daily   ferrous sulfate  325 mg Oral Q breakfast   guaiFENesin  600 mg Oral BID   ipratropium-albuterol  3 mL Nebulization BID   melatonin  5 mg Oral QHS   pantoprazole  40 mg Oral BID AC   predniSONE  40 mg Oral Q breakfast   sodium chloride flush  3 mL Intravenous Q12H   torsemide  20 mg Oral BID   Continuous Infusions:   LOS: 0 days   Time spent= 35 mins    Dimetrius [place] Arsenio Loader, MD [facility]  If 7PM-7AM, please contact night-coverage  07/14/2022, 8:41 AM

## 2022-07-15 DIAGNOSIS — J9621 Acute and chronic respiratory failure with hypoxia: Secondary | ICD-10-CM | POA: Diagnosis not present

## 2022-07-15 LAB — CULTURE, BLOOD (ROUTINE X 2)
Culture: NO GROWTH
Culture: NO GROWTH
Special Requests: ADEQUATE

## 2022-07-15 NOTE — Progress Notes (Signed)
Pt taken off home vent & placed back on 35% ATC

## 2022-07-15 NOTE — Progress Notes (Signed)
PROGRESS NOTE    Gary [name]  [id number] DOB: [birth year] DOA: 04/26/2022 [facility]: [facility]   Brief Narrative:   67 y.o. male with medical history significant of HFpEF with EF of 55-60%, chronic respiratory failure s/p tracheostomy, COPD, hypertension, OSA, hypertension, who presents to the ED due to abdominal pain.  Patient admits of increasing productive cough and rhinorrhea over the past couple of weeks.  Patient has been in the ED for placement for over the last 2 months but while waiting in the ER became hypoxic.  CTA chest showed evidence of bronchial wall thickening concerning for bronchitis but no evidence of PE.  Patient was given dose of IV Lasix due to signs of volume overload and bronchodilators.  Admitted to medical service.  Upon admission COVID/flu/RSV and respiratory panel were negative.  Slowly he was weaned down.  Currently awaiting placement.   Assessment & Plan:  Principal Problem:   Acute on chronic hypoxic respiratory failure ([name]) Active Problems:   (HFpEF) heart failure with preserved ejection fraction ([name])   Obstructive sleep apnea   Chronic kidney disease, stage 3a ([name])   Anxiety and depression   Iron deficiency anemia     Assessment and Plan: * Acute on chronic hypoxic respiratory failure ([place]); stable.  Acute bronchitis/COPD exacerbation; stable.  -Patient was noted to be hypoxic in the ED therefore being admitted and has increasing secretions from his tracheostomy.  Procalcitonin and BNP are negative.  COVID/flu and respiratory panel are negative. - CTA chest negative for pulmonary embolism. - As needed bronchodilators  (HFpEF) heart failure with preserved ejection fraction ([name]) -Does not appear to be in any acute exacerbation.  Did receive 1 dose of IV Lasix.  On home torsemide  Obstructive sleep apnea - Continue home trilogy at bedtime  Chronic kidney disease, stage 3a ([place]) Renal function currently at baseline.   Anxiety  and depression - Continue home SSRI and Atarax  Iron deficiency anemia Hemoglobin currently at baseline. - Continue home iron supplementation.  Bowel regimen as needed  Periodic lab work ordered PT/OT-SNF; ambulatory status slowly improving.   DVT prophylaxis: Eliquis Code Status: Full  Family Communication:   TOC working on placement  Subjective: Seen and examined at bedside, no new complaints  Examination: Constitutional: Not in acute distress Respiratory: Clear to auscultation bilaterally Cardiovascular: Normal sinus rhythm, no rubs Abdomen: Nontender nondistended good bowel sounds Musculoskeletal: No edema noted Skin: No rashes seen Neurologic: CN 2-12 grossly intact.  And nonfocal Psychiatric: Normal judgment and insight. Alert and oriented x 3. Normal mood.  Tracheostomy in place External catheter in place  Objective: Vitals:   07/14/22 2358 07/15/22 0010 07/15/22 0445 07/15/22 0808  BP: (!) 133/90  127/88 111/74  Pulse: 79  82 82  Resp: 19   18  Temp: 98 F (36.7 C)  98.4 F (36.9 C) 98.5 F (36.9 C)  TempSrc:   Oral   SpO2: 100% 95% 95% 96%  Weight:      Height:        Intake/Output Summary (Last 24 hours) at 07/15/2022 0816 Last data filed at 07/15/2022 0400 Gross per 24 hour  Intake 480 ml  Output 2650 ml  Net -2170 ml   Filed Weights   05/04/22 1318 07/11/22 1643 07/12/22 1300  Weight: (!) 177.4 kg (!) 154 kg (!) 185.1 kg     Data Reviewed:   CBC: Recent Labs  Lab 07/10/22 0810 07/11/22 0749 07/12/22 0655 07/13/22 1024 07/14/22 0329  WBC 6.8  6.8 6.4 6.4 6.4  NEUTROABS 4.6 5.3  --   --   --   HGB 10.2* 10.6* 10.7* 10.5* 10.5*  HCT 37.8* 39.5 39.3 38.7* 39.0  MCV 78.8* 79.5* 79.1* 79.0* 78.6*  PLT 252 245 256 245 [id number]   Basic Metabolic Panel: Recent Labs  Lab 07/10/22 0810 07/11/22 0749 07/12/22 0655 07/13/22 1024 07/14/22 0329  NA 137 139 139 139 140  K 3.5 4.0 3.9 3.9 4.1  CL 100 99 98 98 94*  CO2 32 33* 33* 33* 33*   GLUCOSE 118* 132* 91 123* 136*  BUN 28* 31* 37* 43* 46*  CREATININE 1.38* 1.33* 1.39* 1.46* 1.50*  CALCIUM 8.2* 8.5* 8.3* 8.1* 8.4*  MG  --  2.3 2.2 2.3 2.2   GFR: Estimated Creatinine Clearance: 86.6 mL/min (A) (by C-G formula based on SCr of 1.5 mg/dL (H)). Liver Function Tests: Recent Labs  Lab 07/10/22 0810  AST 14*  ALT 11  ALKPHOS 104  BILITOT 0.4  PROT 7.9  ALBUMIN 3.0*   No results for input(s): "LIPASE", "AMYLASE" in the last 168 hours. No results for input(s): "AMMONIA" in the last 168 hours. Coagulation Profile: No results for input(s): "INR", "PROTIME" in the last 168 hours. Cardiac Enzymes: No results for input(s): "CKTOTAL", "CKMB", "CKMBINDEX", "TROPONINI" in the last 168 hours. BNP (last 3 results) No results for input(s): "PROBNP" in the last 8760 hours. HbA1C: No results for input(s): "HGBA1C" in the last 72 hours. CBG: No results for input(s): "GLUCAP" in the last 168 hours. Lipid Profile: No results for input(s): "CHOL", "HDL", "LDLCALC", "TRIG", "CHOLHDL", "LDLDIRECT" in the last 72 hours. Thyroid Function Tests: No results for input(s): "TSH", "T4TOTAL", "FREET4", "T3FREE", "THYROIDAB" in the last 72 hours. Anemia Panel: No results for input(s): "VITAMINB12", "FOLATE", "FERRITIN", "TIBC", "IRON", "RETICCTPCT" in the last 72 hours. Sepsis Labs: Recent Labs  Lab 07/10/22 0808 07/10/22 0810  PROCALCITON <0.10  --   LATICACIDVEN  --  1.7    Recent Results (from the past 240 hour(s))  Resp panel by RT-PCR (RSV, Flu A&B, Covid) Anterior Nasal Swab     Status: None   Collection Time: 07/10/22  7:20 AM   Specimen: Anterior Nasal Swab  Result Value Ref Range Status   SARS Coronavirus 2 by RT PCR NEGATIVE NEGATIVE Final    Comment: (NOTE) SARS-CoV-2 target nucleic acids are NOT DETECTED.  The SARS-CoV-2 RNA is generally detectable in upper respiratory specimens during the acute phase of infection. The lowest concentration of SARS-CoV-2 viral  copies this assay can detect is 138 copies/mL. A negative result does not preclude SARS-Cov-2 infection and should not be used as the sole basis for treatment or other patient management decisions. A negative result may occur with  improper specimen collection/handling, submission of specimen other than nasopharyngeal swab, presence of viral mutation(s) within the areas targeted by this assay, and inadequate number of viral copies(<138 copies/mL). A negative result must be combined with clinical observations, patient history, and epidemiological information. The expected result is Negative.  Fact Sheet for Patients:  EntrepreneurPulse.com.au  Fact Sheet for Healthcare Providers:  IncredibleEmployment.be  This test is no t yet approved or cleared by the [place] FDA and  has been authorized for detection and/or diagnosis of SARS-CoV-2 by FDA under an Emergency Use Authorization (EUA). This EUA will remain  in effect (meaning this test can be used) for the duration of the COVID-19 declaration under Section 564(b)(1) of the Act, 21 U.S.C.section 360bbb-3(b)(1), unless the authorization is terminated  or revoked sooner.       Influenza A by PCR NEGATIVE NEGATIVE Final   Influenza B by PCR NEGATIVE NEGATIVE Final    Comment: (NOTE) The Xpert Xpress SARS-CoV-2/FLU/RSV plus assay is intended as an aid in the diagnosis of influenza from Nasopharyngeal swab specimens and should not be used as a sole basis for treatment. Nasal washings and aspirates are unacceptable for Xpert Xpress SARS-CoV-2/FLU/RSV testing.  Fact Sheet for Patients: EntrepreneurPulse.com.au  Fact Sheet for Healthcare Providers: IncredibleEmployment.be  This test is not yet approved or cleared by the [place] FDA and has been authorized for detection and/or diagnosis of SARS-CoV-2 by FDA under an Emergency Use Authorization (EUA). This  EUA will remain in effect (meaning this test can be used) for the duration of the COVID-19 declaration under Section 564(b)(1) of the Act, 21 U.S.C. section 360bbb-3(b)(1), unless the authorization is terminated or revoked.     Resp Syncytial Virus by PCR NEGATIVE NEGATIVE Final    Comment: (NOTE) Fact Sheet for Patients: EntrepreneurPulse.com.au  Fact Sheet for Healthcare Providers: IncredibleEmployment.be  This test is not yet approved or cleared by the [place] FDA and has been authorized for detection and/or diagnosis of SARS-CoV-2 by FDA under an Emergency Use Authorization (EUA). This EUA will remain in effect (meaning this test can be used) for the duration of the COVID-19 declaration under Section 564(b)(1) of the Act, 21 U.S.C. section 360bbb-3(b)(1), unless the authorization is terminated or revoked.  Performed at [facility], [address] [place], [place] [address]   Culture, blood (routine x 2)     Status: None   Collection Time: 07/10/22  8:10 AM   Specimen: BLOOD  Result Value Ref Range Status   Specimen Description BLOOD RIGHT [facility]  Final   Special Requests   Final    BOTTLES DRAWN AEROBIC AND ANAEROBIC Blood Culture results may not be optimal due to an excessive volume of blood received in culture bottles   Culture   Final    NO GROWTH 5 DAYS Performed at [facility], [address] [place], [place] [address]    Report Status 07/15/2022 FINAL  Final  Culture, blood (routine x 2)     Status: None   Collection Time: 07/10/22  7:55 PM   Specimen: BLOOD  Result Value Ref Range Status   Specimen Description BLOOD BLOOD LEFT HAND  Final   Special Requests   Final    BOTTLES DRAWN AEROBIC ONLY Blood Culture adequate volume   Culture   Final    NO GROWTH 5 DAYS Performed at [facility], [address] [place], [place] [address]    Report Status 07/15/2022 FINAL  Final  Respiratory  (~20 pathogens) panel by PCR     Status: None   Collection Time: 07/11/22 10:40 AM   Specimen: Nasopharyngeal Swab; Respiratory  Result Value Ref Range Status   Adenovirus NOT DETECTED NOT DETECTED Final   Coronavirus 229E NOT DETECTED NOT DETECTED Final    Comment: (NOTE) The Coronavirus on the Respiratory Panel, DOES NOT test for the novel  Coronavirus (2019 nCoV)    Coronavirus HKU1 NOT DETECTED NOT DETECTED Final   Coronavirus NL63 NOT DETECTED NOT DETECTED Final   Coronavirus OC43 NOT DETECTED NOT DETECTED Final   Metapneumovirus NOT DETECTED NOT DETECTED Final   Rhinovirus / Enterovirus NOT DETECTED NOT DETECTED Final   Influenza A NOT DETECTED NOT DETECTED Final   Influenza B NOT DETECTED NOT DETECTED Final  Parainfluenza Virus 1 NOT DETECTED NOT DETECTED Final   Parainfluenza Virus 2 NOT DETECTED NOT DETECTED Final   Parainfluenza Virus 3 NOT DETECTED NOT DETECTED Final   Parainfluenza Virus 4 NOT DETECTED NOT DETECTED Final   Respiratory Syncytial Virus NOT DETECTED NOT DETECTED Final   Bordetella pertussis NOT DETECTED NOT DETECTED Final   Bordetella Parapertussis NOT DETECTED NOT DETECTED Final   Chlamydophila pneumoniae NOT DETECTED NOT DETECTED Final   Mycoplasma pneumoniae NOT DETECTED NOT DETECTED Final    Comment: Performed at [facility], [place] [address], [place], [place] [address]         Radiology Studies: No results found.      Scheduled Meds:  apixaban  2.5 mg Oral BID   budesonide (PULMICORT) nebulizer solution  0.5 mg Nebulization BID   escitalopram  5 mg Oral Daily   ferrous sulfate  325 mg Oral Q breakfast   guaiFENesin  600 mg Oral BID   ipratropium-albuterol  3 mL Nebulization BID   melatonin  5 mg Oral QHS   pantoprazole  40 mg Oral BID AC   sodium chloride flush  3 mL Intravenous Q12H   torsemide  20 mg Oral BID   Continuous Infusions:   LOS: 0 days   Time spent= 35 mins    Anola Mcgough Arsenio Loader, MD [facility]  If  7PM-7AM, please contact night-coverage  07/15/2022, 8:16 AM

## 2022-07-16 DIAGNOSIS — J9621 Acute and chronic respiratory failure with hypoxia: Secondary | ICD-10-CM | POA: Diagnosis not present

## 2022-07-16 LAB — BASIC METABOLIC PANEL
Anion gap: 6 (ref 5–15)
BUN: 44 mg/dL — ABNORMAL HIGH (ref 8–23)
CO2: 35 mmol/L — ABNORMAL HIGH (ref 22–32)
Calcium: 8.1 mg/dL — ABNORMAL LOW (ref 8.9–10.3)
Chloride: 97 mmol/L — ABNORMAL LOW (ref 98–111)
Creatinine, Ser: 1.48 mg/dL — ABNORMAL HIGH (ref 0.61–1.24)
GFR, Estimated: 52 mL/min — ABNORMAL LOW (ref >60)
Glucose, Bld: 121 mg/dL — ABNORMAL HIGH (ref 70–99)
Potassium: 4 mmol/L (ref 3.5–5.1)
Sodium: 138 mmol/L (ref 135–145)

## 2022-07-16 LAB — CBC
HCT: 38.6 % — ABNORMAL LOW (ref 39.0–52.0)
Hemoglobin: 10.4 g/dL — ABNORMAL LOW (ref 13.0–17.0)
MCH: 21.4 pg — ABNORMAL LOW (ref 26.0–34.0)
MCHC: 26.9 g/dL — ABNORMAL LOW (ref 30.0–36.0)
MCV: 79.3 fL — ABNORMAL LOW (ref 80.0–100.0)
Platelets: 232 10*3/uL (ref 150–400)
RBC: 4.87 MIL/uL (ref 4.22–5.81)
RDW: 16.6 % — ABNORMAL HIGH (ref 11.5–15.5)
WBC: 7 10*3/uL (ref 4.0–10.5)
nRBC: 0 % (ref 0.0–0.2)

## 2022-07-16 LAB — MAGNESIUM: Magnesium: 2.3 mg/dL (ref 1.7–2.4)

## 2022-07-16 NOTE — TOC Progression Note (Signed)
Transition of Care [facility]) - Progression Note    Patient Details  Name: Gary [name] MRN: [id number] Date of Birth: 1955/06/05  Transition of Care [facility]) CM/SW Contact  Ross Ludwig, [place] Phone Number: 07/16/2022, 5:00 PM  Clinical Narrative:     CSW emailed Stanton Kidney at [facility] to see if they have made a decision if they can offer a bed for patient or not.  CSW awaiting response back.  CSW also spoke to Little Silver at [employer] to see if patient has any of the DME left.  Per patient he said he returned everything to [employer].  Per Erasmo Downer, she will call CSW back in the morning.  Expected Discharge Plan: [place] Barriers to Discharge: ED Unsafe disposition  Expected Discharge Plan and Services   Discharge Planning Services: CM Consult Post Acute Care Choice: [place] Living arrangements for the past 2 months: Single Family Home                 DME Arranged: N/A DME Agency: NA                   Social Determinants of Health (SDOH) Interventions SDOH Screenings   Food Insecurity: No Food Insecurity (04/15/2022)  Housing: Low Risk  (04/15/2022)  Transportation Needs: Unmet Transportation Needs (04/15/2022)  Utilities: Not At Risk (04/15/2022)  Recent Concern: Utilities - At Risk (02/05/2022)  Depression (PHQ2-9): Low Risk  (06/07/2021)  Financial Resource Strain: Medium Risk (05/03/2017)  Physical Activity: Insufficiently Active (05/03/2017)  Social Connections: Moderately Integrated (05/03/2017)  Stress: No Stress Concern Present (05/03/2017)  Tobacco Use: Medium Risk (04/26/2022)    Readmission Risk Interventions    12/31/2021    8:58 AM 12/20/2021    4:21 PM 05/04/2021    1:26 PM  Readmission Risk Prevention Plan  Transportation Screening Complete Complete Complete  Medication Review Press photographer) Complete Complete Complete  PCP or Specialist appointment within 3-5 days of discharge Complete Complete Complete  HRI or Home Care Consult Complete  Complete Complete  SW Recovery Care/Counseling Consult Complete Complete Complete  Palliative Care Screening Complete Complete Not [place] Not Applicable Not Applicable Not Applicable

## 2022-07-16 NOTE — Progress Notes (Addendum)
Brief Nutrition Follow-Up Note   Wt Readings from Last 15 Encounters:  07/12/22 (!) 185.1 kg  04/14/22 (!) 160.4 kg  04/06/22 (!) 193.2 kg  01/19/22 (!) 171.5 kg  01/03/22 (!) 171.5 kg  12/26/21 (!) 173.1 kg  12/12/21 (!) 183.7 kg  08/21/21 (!) 171.5 kg  08/15/21 (!) 181.8 kg  07/23/21 (!) 185.5 kg  07/05/21 (!) 180.1 kg  06/07/21 (!) 178.3 kg  05/11/21 (!) 181.4 kg  05/08/21 (!) 186.3 kg  04/11/21 (!) 181.8 kg   Pt admitted to hospital on 07/10/22 secondary to hypoxia. Per discussion with RT, pt making good progress and still requiring trach collar and vent at night.   Per MD notes, pt desires to go home. He continues to make good progress with acute rehab team.   Body mass index is 51 kg/m. Patient meets criteria for obesity, class III based on current BMI. Noted wt gain form last hospital weight. Per discussion with PT, pt now able to successfully participate with standing scale, however, suspects previous wt reading may have been inaccurate due to pt's hands on the walker.  RD ordered weekly standing weights for pt to help track progress.   Current diet order is heart healthy, carb modified, patient is consuming approximately 100% of meals at this time. Labs and medications reviewed. Heart healthy, carb modified diet remains the most appropriate diet and treatment plan for pt. Nutritional services department will continue to provide pt with meals within restrictions of prescribed diet, however, RD is unable to monitor foods and liquids provided to pt outside of meal trays.   No nutrition interventions warranted at this time. If nutrition issues arise, please consult RD.   Loistine Chance, RD, LDN, [place] Registered Dietitian II Certified Diabetes Care and Education Specialist Please refer to [facility] for RD and/or RD on-call/weekend/after hours pager

## 2022-07-16 NOTE — Progress Notes (Signed)
PROGRESS NOTE    Gary [name]  [id number] DOB: 1956/01/28 DOA: 04/26/2022 PCP: [facility] [place]   Brief Narrative:   67 y.o. male with medical history significant of HFpEF with EF of 55-60%, chronic respiratory failure s/p tracheostomy, COPD, hypertension, OSA, hypertension, who presents to the ED due to abdominal pain.  Patient admits of increasing productive cough and rhinorrhea over the past couple of weeks.  Patient has been in the ED for placement for over the last 2 months but while waiting in the ER became hypoxic.  CTA chest showed evidence of bronchial wall thickening concerning for bronchitis but no evidence of PE.  Patient was given dose of IV Lasix due to signs of volume overload and bronchodilators.  Admitted to medical service.  Upon admission COVID/flu/RSV and respiratory panel were negative.  Slowly he was weaned down.  Currently awaiting placement. Improving with PT.    Assessment & Plan:  Principal Problem:   Acute on chronic hypoxic respiratory failure Active Problems:   (HFpEF) heart failure with preserved ejection fraction   Obstructive sleep apnea   Chronic kidney disease, stage 3a   Anxiety and depression   Iron deficiency anemia     Assessment and Plan: * Acute on chronic hypoxic respiratory failure ([place]); stable.  Acute bronchitis/COPD exacerbation; stable.  -Patient was noted to be hypoxic in the ED therefore being admitted and has increasing secretions from his tracheostomy.  Procalcitonin and BNP are negative.  COVID/flu and respiratory panel are negative. - CTA chest negative for pulmonary embolism. - As needed bronchodilators  (HFpEF) heart failure with preserved ejection fraction ([name]) -Does not appear to be in any acute exacerbation.  Did receive 1 dose of IV Lasix.  On home torsemide, doing well.   Obstructive sleep apnea - Continue home trilogy at bedtime  Chronic kidney disease, stage 3a ([place]) Renal function currently at  baseline.   Anxiety and depression - Continue home SSRI and Atarax  Iron deficiency anemia Hemoglobin currently at baseline. - Continue home iron supplementation.  Bowel regimen as needed  Periodic lab work ordered PT/OT-SNF; ambulatory status slowly improving.   DVT prophylaxis: Eliquis Code Status: Full  Family Communication:   TOC working on placement. Improving with PT  Subjective: Seen and examined at bedside.  No complaints.  Still wanting to go home  Examination: Constitutional: Not in acute distress Respiratory: Clear to auscultation bilaterally Cardiovascular: Normal sinus rhythm, no rubs Abdomen: Nontender nondistended good bowel sounds Musculoskeletal: No edema noted Skin: No rashes seen Neurologic: CN 2-12 grossly intact.  And nonfocal Psychiatric: Normal judgment and insight. Alert and oriented x 3. Normal mood. Tracheostomy in place External catheter in place  Objective: Vitals:   07/15/22 2342 07/16/22 0429 07/16/22 0742 07/16/22 0818  BP: 120/84 (!) 133/99  106/67  Pulse: 81 77  77  Resp: 19 18  20   Temp: 98.5 F (36.9 C) 98.2 F (36.8 C)  97.7 F (36.5 C)  TempSrc:      SpO2: 96% 93% 98% 94%  Weight:      Height:        Intake/Output Summary (Last 24 hours) at 07/16/2022 0847 Last data filed at 07/16/2022 0430 Gross per 24 hour  Intake 720 ml  Output 3400 ml  Net -2680 ml   Filed Weights   05/04/22 1318 07/11/22 1643 07/12/22 1300  Weight: (!) 177.4 kg (!) 154 kg (!) 185.1 kg     Data Reviewed:   CBC: Recent Labs  Lab 07/10/22  [id number] 07/11/22 0749 07/12/22 0655 07/13/22 1024 07/14/22 0329 07/15/22 2352  WBC 6.8 6.8 6.4 6.4 6.4 7.0  NEUTROABS 4.6 5.3  --   --   --   --   HGB 10.2* 10.6* 10.7* 10.5* 10.5* 10.4*  HCT 37.8* 39.5 39.3 38.7* 39.0 38.6*  MCV 78.8* 79.5* 79.1* 79.0* 78.6* 79.3*  PLT 252 245 256 245 250 [id number]   Basic Metabolic Panel: Recent Labs  Lab 07/11/22 0749 07/12/22 0655 07/13/22 1024 07/14/22 0329  07/15/22 2352  NA 139 139 139 140 138  K 4.0 3.9 3.9 4.1 4.0  CL 99 98 98 94* 97*  CO2 33* 33* 33* 33* 35*  GLUCOSE 132* 91 123* 136* 121*  BUN 31* 37* 43* 46* 44*  CREATININE 1.33* 1.39* 1.46* 1.50* 1.48*  CALCIUM 8.5* 8.3* 8.1* 8.4* 8.1*  MG 2.3 2.2 2.3 2.2 2.3   GFR: Estimated Creatinine Clearance: 87.8 mL/min (A) (by C-G formula based on SCr of 1.48 mg/dL (H)). Liver Function Tests: Recent Labs  Lab 07/10/22 0810  AST 14*  ALT 11  ALKPHOS 104  BILITOT 0.4  PROT 7.9  ALBUMIN 3.0*   No results for input(s): "LIPASE", "AMYLASE" in the last 168 hours. No results for input(s): "AMMONIA" in the last 168 hours. Coagulation Profile: No results for input(s): "INR", "PROTIME" in the last 168 hours. Cardiac Enzymes: No results for input(s): "CKTOTAL", "CKMB", "CKMBINDEX", "TROPONINI" in the last 168 hours. BNP (last 3 results) No results for input(s): "PROBNP" in the last 8760 hours. HbA1C: No results for input(s): "HGBA1C" in the last 72 hours. CBG: No results for input(s): "GLUCAP" in the last 168 hours. Lipid Profile: No results for input(s): "CHOL", "HDL", "LDLCALC", "TRIG", "CHOLHDL", "LDLDIRECT" in the last 72 hours. Thyroid Function Tests: No results for input(s): "TSH", "T4TOTAL", "FREET4", "T3FREE", "THYROIDAB" in the last 72 hours. Anemia Panel: No results for input(s): "VITAMINB12", "FOLATE", "FERRITIN", "TIBC", "IRON", "RETICCTPCT" in the last 72 hours. Sepsis Labs: Recent Labs  Lab 07/10/22 0808 07/10/22 0810  PROCALCITON <0.10  --   LATICACIDVEN  --  1.7    Recent Results (from the past 240 hour(s))  Resp panel by RT-PCR (RSV, Flu A&B, Covid) Anterior Nasal Swab     Status: None   Collection Time: 07/10/22  7:20 AM   Specimen: Anterior Nasal Swab  Result Value Ref Range Status   SARS Coronavirus 2 by RT PCR NEGATIVE NEGATIVE Final    Comment: (NOTE) SARS-CoV-2 target nucleic acids are NOT DETECTED.  The SARS-CoV-2 RNA is generally detectable in  upper respiratory specimens during the acute phase of infection. The lowest concentration of SARS-CoV-2 viral copies this assay can detect is 138 copies/mL. A negative result does not preclude SARS-Cov-2 infection and should not be used as the sole basis for treatment or other patient management decisions. A negative result may occur with  improper specimen collection/handling, submission of specimen other than nasopharyngeal swab, presence of viral mutation(s) within the areas targeted by this assay, and inadequate number of viral copies(<138 copies/mL). A negative result must be combined with clinical observations, patient history, and epidemiological information. The expected result is Negative.  Fact Sheet for Patients:  EntrepreneurPulse.com.au  Fact Sheet for Healthcare Providers:  IncredibleEmployment.be  This test is no t yet approved or cleared by the [place] FDA and  has been authorized for detection and/or diagnosis of SARS-CoV-2 by FDA under an Emergency Use Authorization (EUA). This EUA will remain  in effect (meaning this test can be used) for  the duration of the COVID-19 declaration under Section 564(b)(1) of the Act, 21 U.S.C.section 360bbb-3(b)(1), unless the authorization is terminated  or revoked sooner.       Influenza A by PCR NEGATIVE NEGATIVE Final   Influenza B by PCR NEGATIVE NEGATIVE Final    Comment: (NOTE) The Xpert Xpress SARS-CoV-2/FLU/RSV plus assay is intended as an aid in the diagnosis of influenza from Nasopharyngeal swab specimens and should not be used as a sole basis for treatment. Nasal washings and aspirates are unacceptable for Xpert Xpress SARS-CoV-2/FLU/RSV testing.  Fact Sheet for Patients: EntrepreneurPulse.com.au  Fact Sheet for Healthcare Providers: IncredibleEmployment.be  This test is not yet approved or cleared by the [place] FDA and has been  authorized for detection and/or diagnosis of SARS-CoV-2 by FDA under an Emergency Use Authorization (EUA). This EUA will remain in effect (meaning this test can be used) for the duration of the COVID-19 declaration under Section 564(b)(1) of the Act, 21 U.S.C. section 360bbb-3(b)(1), unless the authorization is terminated or revoked.     Resp Syncytial Virus by PCR NEGATIVE NEGATIVE Final    Comment: (NOTE) Fact Sheet for Patients: EntrepreneurPulse.com.au  Fact Sheet for Healthcare Providers: IncredibleEmployment.be  This test is not yet approved or cleared by the [place] FDA and has been authorized for detection and/or diagnosis of SARS-CoV-2 by FDA under an Emergency Use Authorization (EUA). This EUA will remain in effect (meaning this test can be used) for the duration of the COVID-19 declaration under Section 564(b)(1) of the Act, 21 U.S.C. section 360bbb-3(b)(1), unless the authorization is terminated or revoked.  Performed at [facility], [address] [place], [place] [address]   Culture, blood (routine x 2)     Status: None   Collection Time: 07/10/22  8:10 AM   Specimen: BLOOD  Result Value Ref Range Status   Specimen Description BLOOD RIGHT [facility]  Final   Special Requests   Final    BOTTLES DRAWN AEROBIC AND ANAEROBIC Blood Culture results may not be optimal due to an excessive volume of blood received in culture bottles   Culture   Final    NO GROWTH 5 DAYS Performed at [facility], [address] [place], [place] [address]    Report Status 07/15/2022 FINAL  Final  Culture, blood (routine x 2)     Status: None   Collection Time: 07/10/22  7:55 PM   Specimen: BLOOD  Result Value Ref Range Status   Specimen Description BLOOD BLOOD LEFT HAND  Final   Special Requests   Final    BOTTLES DRAWN AEROBIC ONLY Blood Culture adequate volume   Culture   Final    NO GROWTH 5 DAYS Performed at [facility], [address] [place], [place] [address]    Report Status 07/15/2022 FINAL  Final  Respiratory (~20 pathogens) panel by PCR     Status: None   Collection Time: 07/11/22 10:40 AM   Specimen: Nasopharyngeal Swab; Respiratory  Result Value Ref Range Status   Adenovirus NOT DETECTED NOT DETECTED Final   Coronavirus 229E NOT DETECTED NOT DETECTED Final    Comment: (NOTE) The Coronavirus on the Respiratory Panel, DOES NOT test for the novel  Coronavirus (2019 nCoV)    Coronavirus HKU1 NOT DETECTED NOT DETECTED Final   Coronavirus NL63 NOT DETECTED NOT DETECTED Final   Coronavirus OC43 NOT DETECTED NOT DETECTED Final   Metapneumovirus NOT DETECTED NOT DETECTED Final   Rhinovirus / Enterovirus NOT DETECTED NOT DETECTED  Final   Influenza A NOT DETECTED NOT DETECTED Final   Influenza B NOT DETECTED NOT DETECTED Final   Parainfluenza Virus 1 NOT DETECTED NOT DETECTED Final   Parainfluenza Virus 2 NOT DETECTED NOT DETECTED Final   Parainfluenza Virus 3 NOT DETECTED NOT DETECTED Final   Parainfluenza Virus 4 NOT DETECTED NOT DETECTED Final   Respiratory Syncytial Virus NOT DETECTED NOT DETECTED Final   Bordetella pertussis NOT DETECTED NOT DETECTED Final   Bordetella Parapertussis NOT DETECTED NOT DETECTED Final   Chlamydophila pneumoniae NOT DETECTED NOT DETECTED Final   Mycoplasma pneumoniae NOT DETECTED NOT DETECTED Final    Comment: Performed at [facility], [place] [address], [place], [place] [address]         Radiology Studies: No results found.      Scheduled Meds:  apixaban  2.5 mg Oral BID   budesonide (PULMICORT) nebulizer solution  0.5 mg Nebulization BID   escitalopram  5 mg Oral Daily   ferrous sulfate  325 mg Oral Q breakfast   guaiFENesin  600 mg Oral BID   ipratropium-albuterol  3 mL Nebulization BID   melatonin  5 mg Oral QHS   pantoprazole  40 mg Oral BID AC   sodium chloride flush  3 mL Intravenous Q12H   torsemide  20 mg Oral BID    Continuous Infusions:   LOS: 0 days   Time spent= 35 mins    Keylee Shrestha Arsenio Loader, MD [facility]  If 7PM-7AM, please contact night-coverage  07/16/2022, 8:47 AM

## 2022-07-16 NOTE — Progress Notes (Signed)
Placed pt on home vent for the night.

## 2022-07-16 NOTE — Progress Notes (Signed)
Physical Therapy Treatment Patient Details Name: Gary [name] MRN: [id number] DOB: 1955/09/03 Today's Date: 07/16/2022   History of Present Illness Gary [name] is a 46yoM who comes [facility] on [facility] reports his self care needs not being met after prior DC, staffing issues in home and reportedly not able to get his medications. Pt was DC from here 10 days prior after admission for respiratory distress, found to have active influenza infection, also CHF exacerbation. PMH: morbid obesity, CRF s/p tracheostomy c PMV when awake and triology vent support at when sleeping, HTN, COPD    PT Comments    Patient is motivated to return home at discharge. Barriers to discharge from a mobility perspective include being unable to stand from standard height surfaces as he continues to require elevated surfaces to stand. Today, patient stood with bed height of 28 inches with cues for hand placement with minimal assistance required. From 30 inches, patient requires no physical assistance. He was able to get up from the bed today without physical assistance from therapist using the bed controls to facilitate movement. He walked the lap in hallway with one seated rest break using the rolling walker, chair follow for safety. Would recommend to maximize all possible home health services, community resources, and family support to be successful with discharge home. The patient is speaking with his sister regarding having a ramp built at home (one step to enter). PT will continue to follow to maximize independence and decrease caregiver burden.    Recommendations for follow up therapy are one component of a multi-disciplinary discharge planning process, led by the attending physician.  Recommendations may be updated based on patient status, additional functional criteria and insurance authorization.  Follow Up Recommendations  Can patient physically be transported by private vehicle: No    Assistance Recommended at  Discharge Intermittent Supervision/Assistance  Patient can return home with the following A little help with walking and/or transfers;A little help with bathing/dressing/bathroom;Assistance with cooking/housework;Assist for transportation;Help with stairs or ramp for entrance   Equipment Recommendations  Wheelchair (measurements PT);BSC/3in1;Rolling walker (2 wheels);Hospital bed    Recommendations for Other Services       Precautions / Restrictions Precautions Precautions: Fall Restrictions Weight Bearing Restrictions: No     Mobility  Bed Mobility Overal bed mobility: Needs Assistance Bed Mobility: Supine to Sit     Supine to sit: Supervision, HOB elevated     General bed mobility comments: with head of bed elevated,  patient used the controls to faciliate getting to the edge of bed without physical assistance from therapist (for the first time during this admission). cues for technique. increased time and effort needed    Transfers Overall transfer level: Needs assistance Equipment used: Rolling walker (2 wheels) Transfers: Sit to/from Stand Sit to Stand: Supervision, Min assist, From elevated surface           General transfer comment: emphasis on importance for standing from lower surfaces to simulate home environment and readiness for discharge. patient agreeable to stand from 28 inches. Min A was required for lifting to stand from this height. cues for hand placement with carry over demonstrated. from 30in height, patient does not require physical assistance from therapist    Ambulation/Gait Ambulation/Gait assistance: Supervision Gait Distance (Feet):  (51ft and 52ft) Assistive device: Rolling walker (2 wheels) Gait Pattern/deviations: Step-through pattern Gait velocity: decreased     General Gait Details: 1 seated rest break with walking the lap around the nursing station. cues to take standing breaks  as needed and intermittently relying less on rolling  walker for support as he complains of upper body fatigue. dyspnea with exertion   Stairs             Wheelchair Mobility    Modified Rankin (Stroke Patients Only)       Balance Overall balance assessment: Needs assistance Sitting-balance support: Feet supported Sitting balance-[name] Scale: Normal     Standing balance support: Bilateral upper extremity supported, During functional activity, No upper extremity supported Standing balance-[name] Scale: Fair Standing balance comment: static standing briefly with no UE support and no loss of balance                            Cognition Arousal/Alertness: Awake/alert Behavior During Therapy: WFL for tasks assessed/performed Overall Cognitive Status: Within Functional Limits for tasks assessed                                          Exercises      General Comments        Pertinent Vitals/Pain Pain Assessment Pain Assessment: No/denies pain    Home Living                          Prior Function            PT Goals (current goals can now be found in the care plan section) Acute Rehab PT Goals Patient Stated Goal: to go home PT Goal Formulation: With patient Time For Goal Achievement: 07/19/22 Potential to Achieve Goals: Fair Progress towards PT goals: Progressing toward goals    Frequency    Min 3X/week      PT Plan Discharge plan needs to be updated;Frequency needs to be updated    Co-evaluation              AM-PAC PT "6 Clicks" Mobility   Outcome Measure  Help needed turning from your back to your side while in a flat bed without using bedrails?: A Little Help needed moving from lying on your back to sitting on the side of a flat bed without using bedrails?: A Lot Help needed moving to and from a bed to a chair (including a wheelchair)?: A Lot Help needed standing up from a chair using your arms (e.g., wheelchair or bedside chair)?: A Lot Help needed  to walk in hospital room?: A Lot Help needed climbing 3-5 steps with a railing? : Total 6 Click Score: 12    End of Session Equipment Utilized During Treatment: Oxygen Activity Tolerance: Patient tolerated treatment well Patient left: in chair;with call bell/phone within reach Nurse Communication: Mobility status PT Visit Diagnosis: Unsteadiness on feet (R26.81);Muscle weakness (generalized) (M62.81);Difficulty in walking, not elsewhere classified (R26.2)     Time: [id number] PT Time Calculation (min) (ACUTE ONLY): 45 min  Charges:  $Gait Training: 8-22 mins $Therapeutic Activity: 23-37 mins                    [name], PT, MPT    [name] 07/16/2022, 3:45 PM

## 2022-07-17 DIAGNOSIS — J9621 Acute and chronic respiratory failure with hypoxia: Secondary | ICD-10-CM | POA: Diagnosis not present

## 2022-07-17 NOTE — Progress Notes (Signed)
Occupational Therapy Re-Evaluation Patient Details Name: Gary [name] MRN: [id number] DOB: 04-30-1955 Today's Date: 07/17/2022   History of present illness Gary [name] is a 54yoM who comes to [facility] on 04/26/21 reports his self care needs not being met after prior DC, staffing issues in home and reportedly not able to get his medications. Pt was DC from here 10 days prior after admission for respiratory distress, found to have active influenza infection, also CHF exacerbation. PMH: morbid obesity, CRF s/p tracheostomy c PMV when awake and triology vent support at when sleeping, HTN, COPD   OT comments  Gary [name] was seen for OT treatment on this date. Upon arrival to room pt reclined in bed, agreeable to tx. Pt requires SUPERVISION exit bed. SBA + RW sit<>stand from elevated bed height x2 (28.5" and 30"). Pt rises from 28.5" then states "that took too much work" and deferred repeat trial. MAX A pericare in standing. Pt making good progress toward goals, will continue to follow POC. Discharge recommendation remains appropriate.     Recommendations for follow up therapy are one component of a multi-disciplinary discharge planning process, led by the attending physician.  Recommendations may be updated based on patient status, additional functional criteria and insurance authorization.    Assistance Recommended at Discharge Frequent or constant Supervision/Assistance  Patient can return home with the following  Two people to help with walking and/or transfers;Two people to help with bathing/dressing/bathroom;Assistance with cooking/housework;Help with stairs or ramp for entrance;Assist for transportation   Equipment Recommendations  Hospital bed    Recommendations for Other Services      Precautions / Restrictions Precautions Precautions: Fall Restrictions Weight Bearing Restrictions: No       Mobility Bed Mobility Overal bed mobility: Needs Assistance Bed Mobility: Supine to Sit, Sit  to Supine     Supine to sit: Supervision, HOB elevated Sit to supine: Min assist        Transfers Overall transfer level: Needs assistance Equipment used: Rolling walker (2 wheels) Transfers: Sit to/from Stand Sit to Stand: Supervision           General transfer comment: stands from 28.5" and 30"     Balance Overall balance assessment: Needs assistance Sitting-balance support: Feet supported Sitting balance-[name] Scale: Normal     Standing balance support: During functional activity, No upper extremity supported, Single extremity supported Standing balance-[name] Scale: Fair                             ADL either performed or assessed with clinical judgement   ADL Overall ADL's : Needs assistance/impaired                                       General ADL Comments: MAX A pericare standing.    Extremity/Trunk Assessment Upper Extremity Assessment Upper Extremity Assessment: Overall WFL for tasks assessed   Lower Extremity Assessment Lower Extremity Assessment: Generalized weakness         Cognition Arousal/Alertness: Awake/alert Behavior During Therapy: WFL for tasks assessed/performed Overall Cognitive Status: Within Functional Limits for tasks assessed  Pertinent Vitals/ Pain       Pain Assessment Pain Assessment: No/denies pain   Frequency  Min 2X/week        Progress Toward Goals  OT Goals(current goals can now be found in the care plan section)  Progress towards OT goals: Progressing toward goals;Goals met and updated - see care plan  Acute Rehab OT Goals Patient Stated Goal: to go home OT Goal Formulation: With patient Time For Goal Achievement: 08/14/22 Potential to Achieve Goals: Fair ADL Goals Pt Will Perform Lower Body Dressing: with min assist;sitting/lateral leans;with adaptive equipment Pt Will Transfer to Toilet: with modified  independence;ambulating;bedside commode Pt/caregiver will Perform Home Exercise Program: Increased strength;Both right and left upper extremity;With theraband;With written HEP provided;Independently  Plan Discharge plan remains appropriate;Frequency remains appropriate    Co-evaluation        PT goals addressed during session: Mobility/safety with mobility        AM-PAC OT "6 Clicks" Daily Activity     Outcome Measure   Help from another person eating meals?: None Help from another person taking care of personal grooming?: A Little Help from another person toileting, which includes using toliet, bedpan, or urinal?: A Lot Help from another person bathing (including washing, rinsing, drying)?: A Lot Help from another person to put on and taking off regular upper body clothing?: A Little Help from another person to put on and taking off regular lower body clothing?: A Lot 6 Click Score: 16    End of Session    OT Visit Diagnosis: Other abnormalities of gait and mobility (R26.89);Muscle weakness (generalized) (M62.81)   Activity Tolerance Patient tolerated treatment well   Patient Left in bed;with call bell/phone within reach   Nurse Communication          Time: 1359-1436 OT Time Calculation (min): 37 min  Charges: OT General Charges $OT Visit: 1 Visit OT Evaluation $OT Re-eval: 1 Re-eval OT Treatments $Self Care/Home Management : 23-37 mins  [name], M.S. OTR/L  07/17/22, 2:51 PM  ascom [phone]

## 2022-07-17 NOTE — Progress Notes (Signed)
PROGRESS NOTE    Gary [name]  [id number] DOB: November 17, 1955 DOA: 04/26/2022 PCP: [facility]   Brief Narrative:   67 y.o. male with medical history significant of HFpEF with EF of 55-60%, chronic respiratory failure s/p tracheostomy, COPD, hypertension, OSA, hypertension, who presents to the ED due to abdominal pain.  Patient admits of increasing productive cough and rhinorrhea over the past couple of weeks.  Patient has been in the ED for placement for over the last 2 months but while waiting in the ER became hypoxic.  CTA chest showed evidence of bronchial wall thickening concerning for bronchitis but no evidence of PE.  Patient was given dose of IV Lasix due to signs of volume overload and bronchodilators.  Admitted to medical service.  Upon admission COVID/flu/RSV and respiratory panel were negative.  Slowly he was weaned down.  Currently awaiting placement. Improving with PT.    Assessment & Plan:  Principal Problem:   Acute on chronic hypoxic respiratory failure Active Problems:   (HFpEF) heart failure with preserved ejection fraction   Obstructive sleep apnea   Chronic kidney disease, stage 3a   Anxiety and depression   Iron deficiency anemia     Assessment and Plan: * Acute on chronic hypoxic respiratory failure ([place]); stable.  Acute bronchitis/COPD exacerbation; stable.  -Patient was noted to be hypoxic in the ED therefore being admitted and has increasing secretions from his tracheostomy.  Procalcitonin and BNP are negative.  COVID/flu and respiratory panel are negative. - CTA chest negative for pulmonary embolism. - As needed bronchodilators  (HFpEF) heart failure with preserved ejection fraction ([name]) -Does not appear to be in any acute exacerbation.  Did receive 1 dose of IV Lasix.  On home torsemide, doing well.   Obstructive sleep apnea - Continue home trilogy at bedtime  Chronic kidney disease, stage 3a ([place]) Renal function currently at  baseline.   Anxiety and depression - Continue home SSRI and Atarax  Iron deficiency anemia Hemoglobin currently at baseline. - Continue home iron supplementation.  Bowel regimen as needed  Periodic lab work ordered PT/OT-SNF; ambulatory status slowly improving.   DVT prophylaxis: Eliquis Code Status: Full  Family Communication:   TOC working on placement. Improving with PT  Subjective: Pt reported doing pretty good.  Said he had gotten up and walked.  Examination: Constitutional: NAD, AAOx3 HEENT: conjunctivae and lids normal, EOMI CV: No cyanosis.   RESP: normal respiratory effort, trach collar present Neuro: II - XII grossly intact.   Psych: Normal mood and affect.  Appropriate judgement and reason   Objective: Vitals:   07/17/22 0357 07/17/22 0916 07/17/22 1210 07/17/22 1616  BP: 123/83 109/60 119/82 109/67  Pulse: 76 80 80 86  Resp: 20 18 18 20   Temp: 98.2 F (36.8 C) 97.8 F (36.6 C) 97.7 F (36.5 C) 98.7 F (37.1 C)  TempSrc:  Oral Oral Oral  SpO2: 97% 95% 98% 94%  Weight:      Height:        Intake/Output Summary (Last 24 hours) at 07/17/2022 1927 Last data filed at 07/17/2022 1916 Gross per 24 hour  Intake 480 ml  Output 2300 ml  Net -1820 ml   Filed Weights   05/04/22 1318 07/11/22 1643 07/12/22 1300  Weight: (!) 177.4 kg (!) 154 kg (!) 185.1 kg     Data Reviewed:   CBC: Recent Labs  Lab 07/11/22 0749 07/12/22 0655 07/13/22 1024 07/14/22 0329 07/15/22 2352  WBC 6.8 6.4 6.4 6.4 7.0  NEUTROABS 5.3  --   --   --   --   HGB 10.6* 10.7* 10.5* 10.5* 10.4*  HCT 39.5 39.3 38.7* 39.0 38.6*  MCV 79.5* 79.1* 79.0* 78.6* 79.3*  PLT 245 256 245 250 [id number]   Basic Metabolic Panel: Recent Labs  Lab 07/11/22 0749 07/12/22 0655 07/13/22 1024 07/14/22 0329 07/15/22 2352  NA 139 139 139 140 138  K 4.0 3.9 3.9 4.1 4.0  CL 99 98 98 94* 97*  CO2 33* 33* 33* 33* 35*  GLUCOSE 132* 91 123* 136* 121*  BUN 31* 37* 43* 46* 44*  CREATININE 1.33* 1.39*  1.46* 1.50* 1.48*  CALCIUM 8.5* 8.3* 8.1* 8.4* 8.1*  MG 2.3 2.2 2.3 2.2 2.3   GFR: Estimated Creatinine Clearance: 87.8 mL/min (A) (by C-G formula based on SCr of 1.48 mg/dL (H)). Liver Function Tests: No results for input(s): "AST", "ALT", "ALKPHOS", "BILITOT", "PROT", "ALBUMIN" in the last 168 hours.  No results for input(s): "LIPASE", "AMYLASE" in the last 168 hours. No results for input(s): "AMMONIA" in the last 168 hours. Coagulation Profile: No results for input(s): "INR", "PROTIME" in the last 168 hours. Cardiac Enzymes: No results for input(s): "CKTOTAL", "CKMB", "CKMBINDEX", "TROPONINI" in the last 168 hours. BNP (last 3 results) No results for input(s): "PROBNP" in the last 8760 hours. HbA1C: No results for input(s): "HGBA1C" in the last 72 hours. CBG: No results for input(s): "GLUCAP" in the last 168 hours. Lipid Profile: No results for input(s): "CHOL", "HDL", "LDLCALC", "TRIG", "CHOLHDL", "LDLDIRECT" in the last 72 hours. Thyroid Function Tests: No results for input(s): "TSH", "T4TOTAL", "FREET4", "T3FREE", "THYROIDAB" in the last 72 hours. Anemia Panel: No results for input(s): "VITAMINB12", "FOLATE", "FERRITIN", "TIBC", "IRON", "RETICCTPCT" in the last 72 hours. Sepsis Labs: No results for input(s): "PROCALCITON", "LATICACIDVEN" in the last 168 hours.   Recent Results (from the past 240 hour(s))  Resp panel by RT-PCR (RSV, Flu A&B, Covid) Anterior Nasal Swab     Status: None   Collection Time: 07/10/22  7:20 AM   Specimen: Anterior Nasal Swab  Result Value Ref Range Status   SARS Coronavirus 2 by RT PCR NEGATIVE NEGATIVE Final    Comment: (NOTE) SARS-CoV-2 target nucleic acids are NOT DETECTED.  The SARS-CoV-2 RNA is generally detectable in upper respiratory specimens during the acute phase of infection. The lowest concentration of SARS-CoV-2 viral copies this assay can detect is 138 copies/mL. A negative result does not preclude SARS-Cov-2 infection and  should not be used as the sole basis for treatment or other patient management decisions. A negative result may occur with  improper specimen collection/handling, submission of specimen other than nasopharyngeal swab, presence of viral mutation(s) within the areas targeted by this assay, and inadequate number of viral copies(<138 copies/mL). A negative result must be combined with clinical observations, patient history, and epidemiological information. The expected result is Negative.  Fact Sheet for Patients:  EntrepreneurPulse.com.au  Fact Sheet for Healthcare Providers:  IncredibleEmployment.be  This test is no t yet approved or cleared by the [place] FDA and  has been authorized for detection and/or diagnosis of SARS-CoV-2 by FDA under an Emergency Use Authorization (EUA). This EUA will remain  in effect (meaning this test can be used) for the duration of the COVID-19 declaration under Section 564(b)(1) of the Act, 21 U.S.C.section 360bbb-3(b)(1), unless the authorization is terminated  or revoked sooner.       Influenza A by PCR NEGATIVE NEGATIVE Final   Influenza B by PCR NEGATIVE NEGATIVE  Final    Comment: (NOTE) The Xpert Xpress SARS-CoV-2/FLU/RSV plus assay is intended as an aid in the diagnosis of influenza from Nasopharyngeal swab specimens and should not be used as a sole basis for treatment. Nasal washings and aspirates are unacceptable for Xpert Xpress SARS-CoV-2/FLU/RSV testing.  Fact Sheet for Patients: EntrepreneurPulse.com.au  Fact Sheet for Healthcare Providers: IncredibleEmployment.be  This test is not yet approved or cleared by the [place] FDA and has been authorized for detection and/or diagnosis of SARS-CoV-2 by FDA under an Emergency Use Authorization (EUA). This EUA will remain in effect (meaning this test can be used) for the duration of the COVID-19 declaration  under Section 564(b)(1) of the Act, 21 U.S.C. section 360bbb-3(b)(1), unless the authorization is terminated or revoked.     Resp Syncytial Virus by PCR NEGATIVE NEGATIVE Final    Comment: (NOTE) Fact Sheet for Patients: EntrepreneurPulse.com.au  Fact Sheet for Healthcare Providers: IncredibleEmployment.be  This test is not yet approved or cleared by the [place] FDA and has been authorized for detection and/or diagnosis of SARS-CoV-2 by FDA under an Emergency Use Authorization (EUA). This EUA will remain in effect (meaning this test can be used) for the duration of the COVID-19 declaration under Section 564(b)(1) of the Act, 21 U.S.C. section 360bbb-3(b)(1), unless the authorization is terminated or revoked.  Performed at [facility], [address] [place], [place] [address]   Culture, blood (routine x 2)     Status: None   Collection Time: 07/10/22  8:10 AM   Specimen: BLOOD  Result Value Ref Range Status   Specimen Description BLOOD RIGHT [facility]  Final   Special Requests   Final    BOTTLES DRAWN AEROBIC AND ANAEROBIC Blood Culture results may not be optimal due to an excessive volume of blood received in culture bottles   Culture   Final    NO GROWTH 5 DAYS Performed at [facility], [address] [place], [place] [address]    Report Status 07/15/2022 FINAL  Final  Culture, blood (routine x 2)     Status: None   Collection Time: 07/10/22  7:55 PM   Specimen: BLOOD  Result Value Ref Range Status   Specimen Description BLOOD BLOOD LEFT HAND  Final   Special Requests   Final    BOTTLES DRAWN AEROBIC ONLY Blood Culture adequate volume   Culture   Final    NO GROWTH 5 DAYS Performed at [facility], [address] [place], [place] [address]    Report Status 07/15/2022 FINAL  Final  Respiratory (~20 pathogens) panel by PCR     Status: None   Collection Time: 07/11/22 10:40 AM   Specimen:  Nasopharyngeal Swab; Respiratory  Result Value Ref Range Status   Adenovirus NOT DETECTED NOT DETECTED Final   Coronavirus 229E NOT DETECTED NOT DETECTED Final    Comment: (NOTE) The Coronavirus on the Respiratory Panel, DOES NOT test for the novel  Coronavirus (2019 nCoV)    Coronavirus HKU1 NOT DETECTED NOT DETECTED Final   Coronavirus NL63 NOT DETECTED NOT DETECTED Final   Coronavirus OC43 NOT DETECTED NOT DETECTED Final   Metapneumovirus NOT DETECTED NOT DETECTED Final   Rhinovirus / Enterovirus NOT DETECTED NOT DETECTED Final   Influenza A NOT DETECTED NOT DETECTED Final   Influenza B NOT DETECTED NOT DETECTED Final   Parainfluenza Virus 1 NOT DETECTED NOT DETECTED Final   Parainfluenza Virus 2 NOT DETECTED NOT DETECTED Final   Parainfluenza Virus 3 NOT  DETECTED NOT DETECTED Final   Parainfluenza Virus 4 NOT DETECTED NOT DETECTED Final   Respiratory Syncytial Virus NOT DETECTED NOT DETECTED Final   Bordetella pertussis NOT DETECTED NOT DETECTED Final   Bordetella Parapertussis NOT DETECTED NOT DETECTED Final   Chlamydophila pneumoniae NOT DETECTED NOT DETECTED Final   Mycoplasma pneumoniae NOT DETECTED NOT DETECTED Final    Comment: Performed at [facility], [place] [address] [place], [place] [address]         Radiology Studies: No results found.      Scheduled Meds:  apixaban  2.5 mg Oral BID   budesonide (PULMICORT) nebulizer solution  0.5 mg Nebulization BID   escitalopram  5 mg Oral Daily   ferrous sulfate  325 mg Oral Q breakfast   guaiFENesin  600 mg Oral BID   melatonin  5 mg Oral QHS   pantoprazole  40 mg Oral BID AC   sodium chloride flush  3 mL Intravenous Q12H   torsemide  20 mg Oral BID   Continuous Infusions:   LOS: 0 days   Time spent= 25 mins   Enzo Bi, MD [facility]  If 7PM-7AM, please contact night-coverage  07/17/2022, 7:27 PM

## 2022-07-17 NOTE — TOC Progression Note (Addendum)
Transition of Care [facility]) - Progression Note    Patient Details  Name: Gary [name] MRN: [id number] Date of Birth: October 23, 1955  Transition of Care [facility] CM/SW Contact  Ross Ludwig, [place] Phone Number: 07/17/2022, 10:24 AM  Clinical Narrative:    Per Erasmo Downer at [facility], patient only has vent from [employer].  O2, bed, and wheelchair was picked up on 05/03/2022.  Per PT they will talk to an electric wheelchair company if patient still does not have a SNF bed.  Per PT, patient will need a ramp, and will talk to his sister about trying to get one built.  CSW will check to see if a [facility] agency is willing to accept patient.  Patient also did have CAPS program in place after previous hospitalization, CSW to see if he can still get those services.   Expected Discharge Plan: [place] Barriers to Discharge: ED Unsafe disposition  Expected Discharge Plan and Services   Discharge Planning Services: CM Consult Post Acute Care Choice: [place] Living arrangements for the past 2 months: Single Family Home                 DME Arranged: N/A DME Agency: NA                   Social Determinants of Health (SDOH) Interventions SDOH Screenings   Food Insecurity: No Food Insecurity (04/15/2022)  Housing: Low Risk  (04/15/2022)  Transportation Needs: Unmet Transportation Needs (04/15/2022)  Utilities: Not At Risk (04/15/2022)  Recent Concern: Utilities - At Risk (02/05/2022)  Depression (PHQ2-9): Low Risk  (06/07/2021)  Financial Resource Strain: Medium Risk (05/03/2017)  Physical Activity: Insufficiently Active (05/03/2017)  Social Connections: Moderately Integrated (05/03/2017)  Stress: No Stress Concern Present (05/03/2017)  Tobacco Use: Medium Risk (04/26/2022)    Readmission Risk Interventions    12/31/2021    8:58 AM 12/20/2021    4:21 PM 05/04/2021    1:26 PM  Readmission Risk Prevention Plan  Transportation Screening Complete Complete Complete   Medication Review Press photographer) Complete Complete Complete  PCP or Specialist appointment within 3-5 days of discharge Complete Complete Complete  HRI or Home Care Consult Complete Complete Complete  SW Recovery Care/Counseling Consult Complete Complete Complete  Palliative Care Screening Complete Complete Not [place] Not Applicable Not Applicable Not Applicable

## 2022-07-17 NOTE — Progress Notes (Signed)
Physical Therapy Treatment Patient Details Name: Gary [name] MRN: [id number] DOB: September 23, 1955 Today's Date: 07/17/2022   History of Present Illness Gary [name] is a 51yoM who comes to [facility] on 04/26/21 reports his self care needs not being met after prior DC, staffing issues in home and reportedly not able to get his medications. Pt was DC from here 10 days prior after admission for respiratory distress, found to have active influenza infection, also CHF exacerbation. PMH: morbid obesity, CRF s/p tracheostomy c PMV when awake and triology vent support at when sleeping, HTN, COPD    PT Comments    Pt was supine in bed with HOB elevated ~ 20 degrees. He agrees to session and is agreeable to attempt standing from lower surfaces. Pt was able to exit bed with supervision + increased time. Heavy use of bed/ bed functions throughout session. He stood form 30 in bed height with supervision. Took several steps form EOB to bariatric w/c that had pillows and extra blankets to elevated surface height. Several attempts made to stand form w/c however pt is unable. Author questions pt's effort. He presents with anxiety and fear that are limiting session progression more so than strength/ physical deficits. Author will return tomorrow and continue to focus on improving pt's abilities to stand from lower surfaces. Highly recommend continued skilled PT going forward to maximize independence and safety with all ADLs.     Recommendations for follow up therapy are one component of a multi-disciplinary discharge planning process, led by the attending physician.  Recommendations may be updated based on patient status, additional functional criteria and insurance authorization.  Assistance Recommended at Discharge Intermittent Supervision/Assistance  Patient can return home with the following A little help with walking and/or transfers;A little help with bathing/dressing/bathroom;Assistance with cooking/housework;Assist for  transportation;Help with stairs or ramp for entrance   Equipment Recommendations  Wheelchair (measurements PT);BSC/3in1;Rolling walker (2 wheels);Hospital bed       Precautions / Restrictions Precautions Precautions: Fall Restrictions Weight Bearing Restrictions: No     Mobility  Bed Mobility Overal bed mobility: Needs Assistance Bed Mobility: Supine to Sit  Supine to sit: Supervision, HOB elevated Sit to supine: Mod assist    Transfers Overall transfer level: Needs assistance Equipment used: Rolling walker (2 wheels) Transfers: Sit to/from Stand Sit to Stand: Supervision    General transfer comment: Pt was able to stand form elevated bed  height (30 in) with supervision. Author had pt attempt to stand from lower surfaces but pt unable. Fear of falling and anxiety limiting transfers. W/C had two pillows and blankets  to elevated surface height however due to pt's fear/anxiety, unsafe. Author questions pt effort due to inability to stand even with +2 assistance. elected to hoyer lift transfer pt back into bed form w/c. Lengthy discussion on importance on focusoing on imporving transfer abilities form lower surface height. He states understanding and evenutally admits he has a fear of falling.    Ambulation/Gait Ambulation/Gait assistance: Supervision Gait Distance (Feet): 5 Feet Assistive device: Rolling walker (2 wheels) Gait Pattern/deviations: Step-through pattern Gait velocity: decreased  General Gait Details: Gait distances were limited by author with session focusing on standing from lower surface heights    Balance Overall balance assessment: Needs assistance Sitting-balance support: Feet supported Sitting balance-[name] Scale: Normal     Standing balance support: Bilateral upper extremity supported, During functional activity, No upper extremity supported Standing balance-[name] Scale: Fair Standing balance comment: static standing briefly with no UE support and no  loss of  balance       Cognition Arousal/Alertness: Awake/alert Behavior During Therapy: WFL for tasks assessed/performed Overall Cognitive Status: Within Functional Limits for tasks assessed      General Comments: Pt is A and O x 4. Does have anxiety/ fear with attempts to stand form lower bed heights/ lower w/c surface.           General Comments General comments (skin integrity, edema, etc.): Author discussed at length needing to improve transfers from lower surfaces. He states understanding and is willing to attempt lower transfers next session form adjusting bariatric recliner.      Pertinent Vitals/Pain Pain Assessment Pain Assessment: No/denies pain     PT Goals (current goals can now be found in the care plan section) Acute Rehab PT Goals Patient Stated Goal: to go home Progress towards PT goals: Progressing toward goals    Frequency    Min 3X/week      PT Plan Current plan remains appropriate    Co-evaluation     PT goals addressed during session: Mobility/safety with mobility        AM-PAC PT "6 Clicks" Mobility   Outcome Measure  Help needed turning from your back to your side while in a flat bed without using bedrails?: A Little Help needed moving from lying on your back to sitting on the side of a flat bed without using bedrails?: A Lot Help needed moving to and from a bed to a chair (including a wheelchair)?: A Lot Help needed standing up from a chair using your arms (e.g., wheelchair or bedside chair)?: A Lot Help needed to walk in hospital room?: A Lot Help needed climbing 3-5 steps with a railing? : Total 6 Click Score: 12    End of Session Equipment Utilized During Treatment: Oxygen (trach collar) Activity Tolerance: Patient tolerated treatment well Patient left: in bed;with call bell/phone within reach;with bed alarm set Nurse Communication: Mobility status PT Visit Diagnosis: Unsteadiness on feet (R26.81);Muscle weakness (generalized)  (M62.81);Difficulty in walking, not elsewhere classified (R26.2)     Time: [id number] PT Time Calculation (min) (ACUTE ONLY): 43 min  Charges:  $Gait Training: 8-22 mins $Therapeutic Activity: 23-37 mins                    Julaine Fusi PTA 07/17/22, 12:19 PM

## 2022-07-17 NOTE — Progress Notes (Signed)
Placed patient on home vent for the night.

## 2022-07-17 NOTE — Progress Notes (Signed)
Took patient off home vent and placed back on 28% trach collar.

## 2022-07-18 DIAGNOSIS — J9621 Acute and chronic respiratory failure with hypoxia: Secondary | ICD-10-CM | POA: Diagnosis not present

## 2022-07-18 NOTE — Progress Notes (Signed)
PROGRESS NOTE    Gary [name]  [id number] DOB: 04-08-1956 DOA: 04/26/2022 [facility]CP: [facility]   Brief Narrative:   67 y.o. male with medical history significant of HFpEF with EF of 55-60%, chronic respiratory failure s/p tracheostomy, COPD, hypertension, OSA, hypertension, who presents to the ED due to abdominal pain.  Patient admits of increasing productive cough and rhinorrhea over the past couple of weeks.  Patient has been in the ED for placement for over the last 2 months but while waiting in the ER became hypoxic.  CTA chest showed evidence of bronchial wall thickening concerning for bronchitis but no evidence of PE.  Patient was given dose of IV Lasix due to signs of volume overload and bronchodilators.  Admitted to medical service.  Upon admission COVID/flu/RSV and respiratory panel were negative.  Slowly he was weaned down.  Currently awaiting placement. Improving with PT.    Assessment & Plan:  Principal Problem:   Acute on chronic hypoxic respiratory failure Active Problems:   (HFpEF) heart failure with preserved ejection fraction   Obstructive sleep apnea   Chronic kidney disease, stage 3a   Anxiety and depression   Iron deficiency anemia     Assessment and Plan: * Acute on chronic hypoxic respiratory failure ([place]); stable.  Acute bronchitis/COPD exacerbation; stable.  -Patient was noted to be hypoxic in the ED therefore being admitted and has increasing secretions from his tracheostomy.  Procalcitonin and BNP are negative.  COVID/flu and respiratory panel are negative. - CTA chest negative for pulmonary embolism. - As needed bronchodilators  (HFpEF) heart failure with preserved ejection fraction ([name]) -Does not appear to be in any acute exacerbation.  Did receive 1 dose of IV Lasix.  On home torsemide, doing well.   Obstructive sleep apnea - Continue home trilogy at bedtime  Chronic kidney disease, stage 3a ([place]) Renal function currently at  baseline.   Anxiety and depression - Continue home SSRI and Atarax  Iron deficiency anemia Hemoglobin currently at baseline. - Continue home iron supplementation.  Bowel regimen as needed  Physical Debility -- no SNF bed offers to due BMI and trach status.  TOC following.   Plans underway to d/c home with maximum outpatient support and DME. Current barrier is patient's inability to come to standing from standard seat heights (see PT notes).  Power WC Evaluation --- Patient suffers from acute/chronic respiratory failure with persistent limitations in strength, endurance and overall mobility that limit his ability to perform daily activities (toileting, dressing, grooming, bathing) and his ability to safely negotiate his home environment. A cane, walker, crutch or manual wheelchair will not resolve the patient's issue with performing these daily activities. Patient to require power WC evaluation (with specific features to address seat height, width, seating surface, positioning, etc) to facilitate the appropriate access to his home environment upon discharge.   Periodic lab work ordered PT/OT-SNF; ambulatory status slowly improving.   DVT prophylaxis: Eliquis Code Status: Full  Family Communication:   TOC working on placement. Improving with PT  Subjective: Pt awake sitting up in bed this AM.  Reports feeling well and denies acute complaints.  States he needs to get better with standing so he can go home.   Examination:  General exam: awake, alert, no acute distress, morbidly obese HEENT: trach in place with trach collar, moist mucus membranes, hearing grossly normal  Respiratory system: symmetric chest rise, normal respiratory effort. Cardiovascular system: RRR, intact pedal pulses   Central nervous system: A&O x3. no  gross focal neurologic deficits, normal speech Extremities: moves all, no edema, normal tone Skin: dry, intact, normal temperature Psychiatry: normal mood, flat  affect, judgement and insight appear normal    Objective: Vitals:   07/18/22 0328 07/18/22 0815 07/18/22 0913 07/18/22 1101  BP: (!) 130/98  (!) 133/94 128/87  Pulse: 79  80 81  Resp: 20  (!) 25 (!) 25  Temp: 98.2 F (36.8 C)  98 F (36.7 C) (!) 97.5 F (36.4 C)  TempSrc:      SpO2: 96% 96% 97% 91%  Weight:      Height:        Intake/Output Summary (Last 24 hours) at 07/18/2022 1216 Last data filed at 07/18/2022 1010 Gross per 24 hour  Intake 960 ml  Output 2700 ml  Net -1740 ml   Filed Weights   05/04/22 1318 07/11/22 1643 07/12/22 1300  Weight: (!) 177.4 kg (!) 154 kg (!) 185.1 kg     Data Reviewed:   CBC: Recent Labs  Lab 07/12/22 0655 07/13/22 1024 07/14/22 0329 07/15/22 2352  WBC 6.4 6.4 6.4 7.0  HGB 10.7* 10.5* 10.5* 10.4*  HCT 39.3 38.7* 39.0 38.6*  MCV 79.1* 79.0* 78.6* 79.3*  PLT 256 245 250 [id number]   Basic Metabolic Panel: Recent Labs  Lab 07/12/22 0655 07/13/22 1024 07/14/22 0329 07/15/22 2352  NA 139 139 140 138  K 3.9 3.9 4.1 4.0  CL 98 98 94* 97*  CO2 33* 33* 33* 35*  GLUCOSE 91 123* 136* 121*  BUN 37* 43* 46* 44*  CREATININE 1.39* 1.46* 1.50* 1.48*  CALCIUM 8.3* 8.1* 8.4* 8.1*  MG 2.2 2.3 2.2 2.3   GFR: Estimated Creatinine Clearance: 87.8 mL/min (A) (by C-G formula based on SCr of 1.48 mg/dL (H)). Liver Function Tests: No results for input(s): "AST", "ALT", "ALKPHOS", "BILITOT", "PROT", "ALBUMIN" in the last 168 hours.  No results for input(s): "LIPASE", "AMYLASE" in the last 168 hours. No results for input(s): "AMMONIA" in the last 168 hours. Coagulation Profile: No results for input(s): "INR", "PROTIME" in the last 168 hours. Cardiac Enzymes: No results for input(s): "CKTOTAL", "CKMB", "CKMBINDEX", "TROPONINI" in the last 168 hours. BNP (last 3 results) No results for input(s): "PROBNP" in the last 8760 hours. HbA1C: No results for input(s): "HGBA1C" in the last 72 hours. CBG: No results for input(s): "GLUCAP" in the last 168  hours. Lipid Profile: No results for input(s): "CHOL", "HDL", "LDLCALC", "TRIG", "CHOLHDL", "LDLDIRECT" in the last 72 hours. Thyroid Function Tests: No results for input(s): "TSH", "T4TOTAL", "FREET4", "T3FREE", "THYROIDAB" in the last 72 hours. Anemia Panel: No results for input(s): "VITAMINB12", "FOLATE", "FERRITIN", "TIBC", "IRON", "RETICCTPCT" in the last 72 hours. Sepsis Labs: No results for input(s): "PROCALCITON", "LATICACIDVEN" in the last 168 hours.   Recent Results (from the past 240 hour(s))  Resp panel by RT-PCR (RSV, Flu A&B, Covid) Anterior Nasal Swab     Status: None   Collection Time: 07/10/22  7:20 AM   Specimen: Anterior Nasal Swab  Result Value Ref Range Status   SARS Coronavirus 2 by RT PCR NEGATIVE NEGATIVE Final    Comment: (NOTE) SARS-CoV-2 target nucleic acids are NOT DETECTED.  The SARS-CoV-2 RNA is generally detectable in upper respiratory specimens during the acute phase of infection. The lowest concentration of SARS-CoV-2 viral copies this assay can detect is 138 copies/mL. A negative result does not preclude SARS-Cov-2 infection and should not be used as the sole basis for treatment or other patient management decisions. A negative result  may occur with  improper specimen collection/handling, submission of specimen other than nasopharyngeal swab, presence of viral mutation(s) within the areas targeted by this assay, and inadequate number of viral copies(<138 copies/mL). A negative result must be combined with clinical observations, patient history, and epidemiological information. The expected result is Negative.  Fact Sheet for Patients:  EntrepreneurPulse.com.au  Fact Sheet for Healthcare Providers:  IncredibleEmployment.be  This test is no t yet approved or cleared by the [place] FDA and  has been authorized for detection and/or diagnosis of SARS-CoV-2 by FDA under an Emergency Use Authorization (EUA).  This EUA will remain  in effect (meaning this test can be used) for the duration of the COVID-19 declaration under Section 564(b)(1) of the Act, 21 U.S.C.section 360bbb-3(b)(1), unless the authorization is terminated  or revoked sooner.       Influenza A by PCR NEGATIVE NEGATIVE Final   Influenza B by PCR NEGATIVE NEGATIVE Final    Comment: (NOTE) The Xpert Xpress SARS-CoV-2/FLU/RSV plus assay is intended as an aid in the diagnosis of influenza from Nasopharyngeal swab specimens and should not be used as a sole basis for treatment. Nasal washings and aspirates are unacceptable for Xpert Xpress SARS-CoV-2/FLU/RSV testing.  Fact Sheet for Patients: EntrepreneurPulse.com.au  Fact Sheet for Healthcare Providers: IncredibleEmployment.be  This test is not yet approved or cleared by the [place] FDA and has been authorized for detection and/or diagnosis of SARS-CoV-2 by FDA under an Emergency Use Authorization (EUA). This EUA will remain in effect (meaning this test can be used) for the duration of the COVID-19 declaration under Section 564(b)(1) of the Act, 21 U.S.C. section 360bbb-3(b)(1), unless the authorization is terminated or revoked.     Resp Syncytial Virus by PCR NEGATIVE NEGATIVE Final    Comment: (NOTE) Fact Sheet for Patients: EntrepreneurPulse.com.au  Fact Sheet for Healthcare Providers: IncredibleEmployment.be  This test is not yet approved or cleared by the [place] FDA and has been authorized for detection and/or diagnosis of SARS-CoV-2 by FDA under an Emergency Use Authorization (EUA). This EUA will remain in effect (meaning this test can be used) for the duration of the COVID-19 declaration under Section 564(b)(1) of the Act, 21 U.S.C. section 360bbb-3(b)(1), unless the authorization is terminated or revoked.  Performed at [facility], [address]  [place], [place] [address]   Culture, blood (routine x 2)     Status: None   Collection Time: 07/10/22  8:10 AM   Specimen: BLOOD  Result Value Ref Range Status   Specimen Description BLOOD RIGHT [facility]  Final   Special Requests   Final    BOTTLES DRAWN AEROBIC AND ANAEROBIC Blood Culture results may not be optimal due to an excessive volume of blood received in culture bottles   Culture   Final    NO GROWTH 5 DAYS Performed at [facility], [address], [place], [place] [address]    Report Status 07/15/2022 FINAL  Final  Culture, blood (routine x 2)     Status: None   Collection Time: 07/10/22  7:55 PM   Specimen: BLOOD  Result Value Ref Range Status   Specimen Description BLOOD BLOOD LEFT HAND  Final   Special Requests   Final    BOTTLES DRAWN AEROBIC ONLY Blood Culture adequate volume   Culture   Final    NO GROWTH 5 DAYS Performed at [facility], [address], [place], [place] [address]    Report Status 07/15/2022 FINAL  Final  Respiratory (~20 pathogens) panel by PCR     Status: None   Collection Time: 07/11/22 10:40 AM   Specimen: Nasopharyngeal Swab; Respiratory  Result Value Ref Range Status   Adenovirus NOT DETECTED NOT DETECTED Final   Coronavirus 229E NOT DETECTED NOT DETECTED Final    Comment: (NOTE) The Coronavirus on the Respiratory Panel, DOES NOT test for the novel  Coronavirus (2019 nCoV)    Coronavirus HKU1 NOT DETECTED NOT DETECTED Final   Coronavirus NL63 NOT DETECTED NOT DETECTED Final   Coronavirus OC43 NOT DETECTED NOT DETECTED Final   Metapneumovirus NOT DETECTED NOT DETECTED Final   Rhinovirus / Enterovirus NOT DETECTED NOT DETECTED Final   Influenza A NOT DETECTED NOT DETECTED Final   Influenza B NOT DETECTED NOT DETECTED Final   Parainfluenza Virus 1 NOT DETECTED NOT DETECTED Final   Parainfluenza Virus 2 NOT DETECTED NOT DETECTED Final   Parainfluenza Virus 3 NOT DETECTED NOT DETECTED Final   Parainfluenza Virus 4 NOT DETECTED  NOT DETECTED Final   Respiratory Syncytial Virus NOT DETECTED NOT DETECTED Final   Bordetella pertussis NOT DETECTED NOT DETECTED Final   Bordetella Parapertussis NOT DETECTED NOT DETECTED Final   Chlamydophila pneumoniae NOT DETECTED NOT DETECTED Final   Mycoplasma pneumoniae NOT DETECTED NOT DETECTED Final    Comment: Performed at [facility], [address], [place], [place] [address]         Radiology Studies: No results found.      Scheduled Meds:  apixaban  2.5 mg Oral BID   budesonide (PULMICORT) nebulizer solution  0.5 mg Nebulization BID   escitalopram  5 mg Oral Daily   ferrous sulfate  325 mg Oral Q breakfast   guaiFENesin  600 mg Oral BID   melatonin  5 mg Oral QHS   pantoprazole  40 mg Oral BID AC   sodium chloride flush  3 mL Intravenous Q12H   torsemide  20 mg Oral BID   Continuous Infusions:   LOS: 0 days   Time spent= 25 mins   Ezekiel Slocumb, DO [facility]  If 7PM-7AM, please contact night-coverage  07/18/2022, 12:16 PM

## 2022-07-18 NOTE — Progress Notes (Signed)
Occupational Therapy Treatment Patient Details Name: Gary [name] MRN: [id number] DOB: Nov 15, 1955 Today's Date: 07/18/2022   History of present illness Gary [name] is a 66yoM who comes to [facility] on 04[facility]21 reports his self care needs not being met after prior DC, staffing issues in home and reportedly not able to get his medications. Pt was DC from here 10 days prior after admission for respiratory distress, found to have active influenza infection, also CHF exacerbation. PMH: morbid obesity, CRF s/p tracheostomy c PMV when awake and triology vent support at when sleeping, HTN, COPD   OT comments  Gary [name] was seen for OT treatment on this date. Upon arrival to room pt reclined in bed, agreeable to tx. Pt currently MOD I to exit bed. Requires CGA + RW for functional mobility, tolerated ~14ft x3. Stood from bariatric lift chair x2 trials at 26". Lengthy discussion on requirements to return home, pt perseverative on walking as barrier to d/c however difficulty standing from standard heights is main barrier. Requested pt to ask family to measure height of kitchen barstool, BSC, and bed. Pt making good progress toward goals, will continue to follow POC. Discharge recommendation remains appropriate.     Recommendations for follow up therapy are one component of a multi-disciplinary discharge planning process, led by the attending physician.  Recommendations may be updated based on patient status, additional functional criteria and insurance authorization.    Assistance Recommended at Discharge Frequent or constant Supervision/Assistance  Patient can return home with the following  Two people to help with walking and/or transfers;Two people to help with bathing/dressing/bathroom;Assistance with cooking/housework;Help with stairs or ramp for entrance;Assist for transportation   Equipment Recommendations  Hospital bed    Recommendations for Other Services      Precautions / Restrictions  Precautions Precautions: Fall Restrictions Weight Bearing Restrictions: No       Mobility Bed Mobility Overal bed mobility: Needs Assistance Bed Mobility: Supine to Sit, Sit to Supine     Supine to sit: Modified independent (Device/Increase time) Sit to supine: Min assist   General bed mobility comments: assist for RLE return to bed    Transfers Overall transfer level: Needs assistance Equipment used: Rolling walker (2 wheels) Transfers: Sit to/from Stand Sit to Stand: Supervision           General transfer comment: stands from 26" chair height     Balance Overall balance assessment: Needs assistance Sitting-balance support: Feet supported Sitting balance-[name] Scale: Normal     Standing balance support: No upper extremity supported Standing balance-[name] Scale: Fair                             ADL either performed or assessed with clinical judgement   ADL Overall ADL's : Needs assistance/impaired                                       General ADL Comments: CGA + RW for functional mobility, tolerated ~40ft x3. MAX A don B shoes seated EOB.      Cognition Arousal/Alertness: Awake/alert Behavior During Therapy: WFL for tasks assessed/performed Overall Cognitive Status: Within Functional Limits for tasks assessed  Pertinent Vitals/ Pain       Pain Assessment Pain Assessment: No/denies pain   Frequency  Min 2X/week        Progress Toward Goals  OT Goals(current goals can now be found in the care plan section)  Progress towards OT goals: Progressing toward goals  Acute Rehab OT Goals Patient Stated Goal: to go home OT Goal Formulation: With patient Time For Goal Achievement: 08/14/22 Potential to Achieve Goals: Fair ADL Goals Pt Will Perform Grooming: sitting;with modified independence Pt Will Perform Lower Body Dressing: with min  assist;sitting/lateral leans;with adaptive equipment Pt Will Transfer to Toilet: with modified independence;ambulating;bedside commode Pt/caregiver will Perform Home Exercise Program: Increased strength;Both right and left upper extremity;With theraband;With written HEP provided;Independently  Plan Discharge plan remains appropriate;Frequency remains appropriate    Co-evaluation                 AM-PAC OT "6 Clicks" Daily Activity     Outcome Measure   Help from another person eating meals?: None Help from another person taking care of personal grooming?: A Little Help from another person toileting, which includes using toliet, bedpan, or urinal?: A Lot Help from another person bathing (including washing, rinsing, drying)?: A Lot Help from another person to put on and taking off regular upper body clothing?: A Little Help from another person to put on and taking off regular lower body clothing?: A Lot 6 Click Score: 16    End of Session Equipment Utilized During Treatment: Rolling walker (2 wheels)  OT Visit Diagnosis: Other abnormalities of gait and mobility (R26.89);Muscle weakness (generalized) (M62.81)   Activity Tolerance Patient tolerated treatment well   Patient Left in bed;with call bell/phone within reach   Nurse Communication          Time: [id number] OT Time Calculation (min): 38 min  Charges: OT General Charges $OT Visit: 1 Visit OT Treatments $Self Care/Home Management : 23-37 mins $Therapeutic Activity: 8-22 mins  [name], M.S. OTR/L  07/18/22, 2:50 PM  ascom [phone]

## 2022-07-18 NOTE — Progress Notes (Signed)
Took patient off home vent for the day and placed back on 28% trach collar.

## 2022-07-19 DIAGNOSIS — J9621 Acute and chronic respiratory failure with hypoxia: Secondary | ICD-10-CM | POA: Diagnosis not present

## 2022-07-19 LAB — CBC
HCT: 39 % (ref 39.0–52.0)
Hemoglobin: 10.4 g/dL — ABNORMAL LOW (ref 13.0–17.0)
MCH: 21.1 pg — ABNORMAL LOW (ref 26.0–34.0)
MCHC: 26.7 g/dL — ABNORMAL LOW (ref 30.0–36.0)
MCV: 78.9 fL — ABNORMAL LOW (ref 80.0–100.0)
Platelets: 181 10*3/uL (ref 150–400)
RBC: 4.94 MIL/uL (ref 4.22–5.81)
RDW: 17.1 % — ABNORMAL HIGH (ref 11.5–15.5)
WBC: 7 10*3/uL (ref 4.0–10.5)
nRBC: 0 % (ref 0.0–0.2)

## 2022-07-19 LAB — BASIC METABOLIC PANEL
Anion gap: 8 (ref 5–15)
BUN: 36 mg/dL — ABNORMAL HIGH (ref 8–23)
CO2: 33 mmol/L — ABNORMAL HIGH (ref 22–32)
Calcium: 8.2 mg/dL — ABNORMAL LOW (ref 8.9–10.3)
Chloride: 98 mmol/L (ref 98–111)
Creatinine, Ser: 1.38 mg/dL — ABNORMAL HIGH (ref 0.61–1.24)
GFR, Estimated: 57 mL/min — ABNORMAL LOW (ref >60)
Glucose, Bld: 101 mg/dL — ABNORMAL HIGH (ref 70–99)
Potassium: 4.1 mmol/L (ref 3.5–5.1)
Sodium: 139 mmol/L (ref 135–145)

## 2022-07-19 LAB — MAGNESIUM: Magnesium: 2.6 mg/dL — ABNORMAL HIGH (ref 1.7–2.4)

## 2022-07-19 NOTE — Progress Notes (Signed)
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 recline for ADL care  [] change position for pressure relief/cannot weight shift [] rest periods [] repositioning for transfers or clothing/diaper /catheter changes [] head positioning  [] Lighter weight required [] self- propulsion  [] lifting []                                                 [x] Heavy Duty required [x] user weight greater than 250# [] extreme tone/ over active movement [] broken frame on previous chair []                                     [x]  Back  []  Angle Adjustable []  Custom molded Captain chair                           [x] postural control [] control of tone/spasticity [] accommodation of range of motion [] UE functional control [x] accommodation for seating system []                                          [] provide lateral trunk support [] accommodate deformity [x] provide posterior trunk support [] provide lumbar/sacral support [] support trunk in midline [] Pressure relief over spinal processes  [x]  Seat  Cushion Captain Chair                       [] impaired sensation  [] decubitus ulcers present [] history of pressure ulceration [] prevent pelvic extension [x] low maintenance  [x] stabilize pelvis  [] accommodate obliquity [] accommodate multiple deformity [] neutralize lower extremity position [] increase pressure distribution []                                           []  Pelvic/thigh support  []  Lateral thigh guide []  Distal medial pad  []  Distal lateral pad []  pelvis in neutral [] accommodate pelvis []  position upper legs []  alignment []  accommodate ROM []  decrease adduction [] accommodate tone [] removable for transfers [] decrease abduction  []  Lateral trunk Supports []  Lt     []  Rt [] decrease lateral trunk leaning [] control tone [] contour for increased contact [] safety  [] accommodate asymmetry []                                                 []  Mounting hardware  [] lateral trunk supports  [] back   [] seat [] headrest      []  thigh support [] fixed   [] swing away [] attach seat platform/cushion to w/c frame [] attach back cushion to w/c frame [] mount postural supports [] mount headrest  [] swing medial thigh support away [] swing lateral supports away for transfers  []                                                     Armrests  []   fixed [x] adjustable height [] removable   [] swing away  [x] flip back   [] reclining [x] full length pads [] desk    [] pads tubular  [x] provide support with elbow at 90   [] provide support for w/c tray [x] change of height/angles for variable activities [x] remove for transfers [x] allow to come closer to table top [] remove for access to tables []                                               Hangers/ Leg rests  [] 60 [] 70 [] 90 [] elevating [] heavy duty  [] articulating [] fixed [] lift off [] swing away     [] power [] provide LE support  [] accommodate to hamstring tightness [] elevate legs during recline   [] provide change in position for Legs [] Maintain  placement of feet on footplate [] durability [] enable transfers [] decrease edema [] Accommodate lower leg length []                                         Foot support Footplate    [] Lt  []  Rt  [x]  Center mount [x] flip up                            [x] depth/angle adjustable [] Amputee adapter    []  Lt     []  Rt [x] provide foot support [] accommodate to ankle ROM [x] transfers [] Provide support for residual extremity []  allow foot to go under wheelchair base []  decrease tone  []                                                 []  Ankle strap/heel loops [] support foot on foot support [] decrease extraneous movement [] provide input to heel  [] protect foot  Tires: [x] pneumatic  [x] flat free inserts  [] solid  [x] decrease maintenance  [x] prevent frequent flats [] increase shock absorbency [] decrease pain from road shock [] decrease spasms from road shock []                                              [x]  Headrest  [x] provide posterior head support [] provide posterior neck support [] provide lateral head support [] provide anterior head support [] support during tilt and recline [] improve feeding   [] improve respiration [] placement of switches [] safety  [] accommodate ROM  [] accommodate tone [] improve visual orientation  []  Anterior chest strap []  Vest []  Shoulder retractors  [] decrease forward movement of shoulder [] accommodation of TLSO [] decrease forward movement of trunk [] decrease shoulder elevation [] added abdominal support [] alignment [] assistance with shoulder control  []                                               Pelvic Positioner [x] Belt [] SubASIS bar [] Dual Pull [] stabilize tone [x] decrease falling out of chair/ **will not Decrease potential for sliding due to pelvic tilting [] prevent excessive rotation [] pad for protection over boney prominence [] prominence comfort [] special pull angle to control rotation []   Upper  ExtremitySupport  [] L   []  R [] Arm trough   [] hand support []  tray       [] full tray [] swivel mount [] decrease edema      [] decrease subluxation   [] control tone   [] placement for AAC/Computer/EADL [] decrease gravitational pull on shoulders [] provide midline positioning [] provide support to increase UE function [] provide hand support in natural position [] provide work surface   POWER WHEELCHAIR CONTROLS  [x] Proportional  [] Non-Proportional Type                                      [] Left  [x] Right [x] provides access for controlling wheelchair   [] lacks motor control to operate proportional drive control [] unable to understand proportional controls  Actuator Control Module  [] Single  [] Multiple   [] Allow the client to operate the power seat function(s) through the joystick control   [] Safety Reset Switches [] Used to change modes and stop the wheelchair when driving in latch mode    [] Upgraded Electronics   [] programming for accurate control [] progressive Disease/changing condition [] non-proportional drive control needed [] Needed in order to operate power seat functions through joystick control   [] Display box [] Allows user to see in which mode and drive the wheelchair is set  [] necessary for alternate controls    [] Digital interface electronics [] Allows w/c to operate when using alternative drive controls  [] ASL Head Array [] Allows client to operate wheelchair  through switches placed in tri-panel headrest  [] Sip and puff with tubing kit [] needed to operate sip and puff drive controls  [] Upgraded tracking electronics [] increase safety when driving [] correct tracking when on uneven surfaces  [] Mount for switches or joystick [] Attaches switches to w/c  [] Swing away for access or transfers [] midline for optimal placement [] provides for consistent access  [] Attendant controlled joystick plus mount [] safety [] long distance driving [] operation of seat functions [] compliance with  transportation regulations []                                             Rear wheel placement/Axle adjustability [] None [] semi adjustable [] fully adjustable  [] improved UE access to wheels [] improved stability [] changing angle in space for improvement of postural stability [] 1-arm drive access [] amputee pad placement []                                Wheel rims/ hand rims  [] metal   [] plastic coated [] oblique projections           [] vertical projections [] Provide ability to propel manual wheelchair  []  Increase self-propulsion with hand weakness/decreased grasp  Push handles [] extended   [] angle adjustable              [] standard [] caregiver access [] caregiver assist [] allows "hooking" to enable increased ability to perform ADLs or maintain balance  One armed device   [] Lt   [] Rt [] enable propulsion of manual wheelchair with one arm   []                                            Brake/wheel lock extension []  Lt   []  Rt [] increase indep in applying wheel locks   [] Side guards [] prevent  clothing getting caught in wheel or becoming soiled []  prevent skin tears/abrasions  Battery:  2 NF 22                                          [x] to power wheelchair                                                         Other:                                                                                                                        The above equipment has a life- long use expectancy. Growth and changes in medical and/or functional conditions would be the exceptions. This is to certify that the therapist has no financial relationship with durable medical provider or manufacturer. The therapist will not receive remuneration of any kind for the equipment recommended in this evaluation.   Patient has mobility limitation that significantly impairs safe, timely participation in one or more mobility related ADL's. (bathing, toileting, feeding, dressing, grooming, moving from room to room)  [x]  Yes  []  No  Will mobility device sufficiently improve ability to participate and/or be aided in participation of MRADL's?      [x]  Yes []  No  Can limitation be compensated for with use of a cane or walker?                                    []  Yes [x]  No  Does patient or caregiver demonstrate ability/potential ability & willingness to safely use the mobility device?    [x]  Yes []  No  Does patient's home environment support use of recommended mobility device?            [x]  Yes []  No  Does patient have sufficient upper extremity function necessary to functionally propel a manual wheelchair?     []  Yes [x]  No  Does patient have sufficient strength and trunk stability to safely operate a POV (scooter)?                                  []  Yes [x]  No  Does patient need additional features/benefits provided by a power wheelchair for MRADL's in the home?        [x]  Yes []  No  Does the patient demonstrate the ability to safely use a power wheelchair?                   [x]  Yes []  No     Physician's Name Printed:  Physician's Signature:  Date:     This is to certify that I, the above signed therapist have the following affiliations: []  This DME provider []  Manufacturer of recommended equipment []  Patient's long term care facility []  None of the above  Therapist Name/Signature:     Janna Arch, PT, DPT Physical Therapist - [place] [phone]                                          Date: 07/19/22  Janna Arch, PT 07/19/2022, 3:13 PM   Performed wheelchair evaluation, see below.    Recommendations for follow up therapy are one component of a multi-disciplinary discharge planning process, led by the attending physician.  Recommendations may be updated based on patient status, additional functional criteria and insurance authorization.  Follow Up  Recommendations       Assistance Recommended at Discharge    Patient can return home with the following A little help with walking and/or transfers;A little help with bathing/dressing/bathroom;Assistance with cooking/housework;Assist for transportation;Help with stairs or ramp for entrance   Equipment Recommendations       Recommendations for Other Services       Precautions / Restrictions Precautions Precautions: Fall Restrictions Weight Bearing Restrictions: No RLE Weight Bearing: Weight bearing as tolerated     Mobility  Bed Mobility Overal bed mobility: Needs Assistance Bed Mobility: Supine to Sit, Sit to Supine     Supine to sit: Modified independent (Device/Increase time) Sit to supine: Max assist   General bed mobility comments: assist for RLE return to bed    Transfers Overall transfer level: Needs assistance Equipment used: Rolling walker (2 wheels) Transfers: Sit to/from Stand Sit to Stand: Min guard           General transfer comment: stands from 26" chair height    Ambulation/Gait                   Stairs             Wheelchair Mobility    Modified Rankin (Stroke Patients Only)       Balance Overall balance assessment: Needs assistance Sitting-balance support: Feet supported Sitting balance-[name] Scale: Normal     Standing balance support: No upper extremity supported Standing balance-[name] Scale: Fair                              Cognition Arousal/Alertness: Awake/alert Behavior During Therapy: WFL for tasks assessed/performed Overall Cognitive Status: Within Functional Limits for tasks assessed                                          Exercises Other Exercises Other Exercises: wheelchair evaluation    General Comments        Pertinent Vitals/Pain Pain Assessment Pain Assessment: Faces Faces Pain Scale: Hurts even more Pain Location: L shoulder and knee Pain Descriptors /  Indicators: Sore Pain Intervention(s): Limited activity within patient's tolerance, Monitored during session    Home Living                          Prior Function  PT Goals (current goals can now be found in the care plan section) Acute Rehab PT Goals Patient Stated Goal: to get a power chair Progress towards PT goals: Progressing toward goals    Frequency    Min 3X/week      PT Plan Current plan remains appropriate    Co-evaluation              AM-PAC PT "6 Clicks" Mobility   Outcome Measure  Help needed turning from your back to your side while in a flat bed without using bedrails?: A Little Help needed moving from lying on your back to sitting on the side of a flat bed without using bedrails?: A Lot Help needed moving to and from a bed to a chair (including a wheelchair)?: A Lot Help needed standing up from a chair using your arms (e.g., wheelchair or bedside chair)?: A Lot Help needed to walk in hospital room?: A Lot Help needed climbing 3-5 steps with a railing? : Total 6 Click Score: 12    End of Session Equipment Utilized During Treatment: Oxygen (4 L via trach color with 28% Fi02) Activity Tolerance: Patient limited by pain;Patient limited by fatigue Patient left: in bed;with call bell/phone within reach;with bed alarm set Nurse Communication: Mobility status PT Visit Diagnosis: Unsteadiness on feet (R26.81);Muscle weakness (generalized) (M62.81);Difficulty in walking, not elsewhere classified (R26.2)     Time: [id number] PT Time Calculation (min) (ACUTE ONLY): 54 min  Charges:                        Janna Arch, PT, DPT Physical Therapist - [place]      Janna Arch 07/19/2022, 3:13 PM

## 2022-07-19 NOTE — Progress Notes (Signed)
PROGRESS NOTE    Gary [name]  [id number] DOB: [birth year] DOA: 04/26/2022 PCP: [facility] [place]   Brief Narrative:   67 y.o. male with medical history significant of HFpEF with EF of 55-60%, chronic respiratory failure s/p tracheostomy, COPD, hypertension, OSA, hypertension, who presents to the ED due to abdominal pain.  Patient admits of increasing productive cough and rhinorrhea over the past couple of weeks.  Patient has been in the ED for placement for over the last 2 months but while waiting in the ER became hypoxic.  CTA chest showed evidence of bronchial wall thickening concerning for bronchitis but no evidence of PE.  Patient was given dose of IV Lasix due to signs of volume overload and bronchodilators.  Admitted to medical service.  Upon admission COVID/flu/RSV and respiratory panel were negative.  Slowly he was weaned down.  Currently awaiting placement. Improving with PT.    Assessment & Plan:  Principal Problem:   Acute on chronic hypoxic respiratory failure Active Problems:   (HFpEF) heart failure with preserved ejection fraction   Obstructive sleep apnea   Chronic kidney disease, stage 3a   Anxiety and depression   Iron deficiency anemia     Assessment and Plan: * Acute on chronic hypoxic respiratory failure ([place]); stable.  Acute bronchitis/COPD exacerbation; stable.  -Patient was noted to be hypoxic in the ED therefore being admitted and has increasing secretions from his tracheostomy.  Procalcitonin and BNP are negative.  COVID/flu and respiratory panel are negative. - CTA chest negative for pulmonary embolism. - As needed bronchodilators  (HFpEF) heart failure with preserved ejection fraction ([name]) -Does not appear to be in any acute exacerbation.  Did receive 1 dose of IV Lasix.  On home torsemide, doing well.   Obstructive sleep apnea - Continue home trilogy at bedtime  Chronic kidney disease, stage 3a ([name]) Renal function currently at  baseline.   Anxiety and depression - Continue home SSRI and Atarax  Iron deficiency anemia Hemoglobin currently at baseline. - Continue home iron supplementation.  Bowel regimen as needed  Physical Debility -- no SNF bed offers to due BMI and trach status.  TOC following.   Plans underway to d/c home with maximum outpatient support and DME. Current barrier is patient's inability to come to standing from standard seat heights (see PT notes).  Power WC Evaluation --- Patient suffers from acute/chronic respiratory failure with persistent limitations in strength, endurance and overall mobility that limit his ability to perform daily activities (toileting, dressing, grooming, bathing) and his ability to safely negotiate his home environment. A cane, walker, crutch or manual wheelchair will not resolve the patient's issue with performing these daily activities. Patient to require power WC evaluation (with specific features to address seat height, width, seating surface, positioning, etc) to facilitate the appropriate access to his home environment upon discharge.   Periodic lab work ordered PT/OT-SNF; ambulatory status slowly improving.   DVT prophylaxis: Eliquis Code Status: Full  Family Communication:   TOC working on placement. Improving with PT  Subjective: Pt awake sitting up in bed this AM.  No acute complaints.  Vendor coming today for evaluation for power chair.  Examination:  General exam: awake, alert, no acute distress, morbidly obese HEENT: trach in place with trach collar, moist mucus membranes, hearing grossly normal  Respiratory system: symmetric chest rise, normal respiratory effort. Cardiovascular system: RRR, intact pedal pulses   Central nervous system: A&O x3. no gross focal neurologic deficits, normal speech Extremities: moves all, no  edema, normal tone Skin: dry, intact, normal temperature Psychiatry: normal mood, flat affect, judgement and insight appear  normal    Objective: Vitals:   07/18/22 2302 07/19/22 0400 07/19/22 0833 07/19/22 1144  BP: 108/70 110/66 128/87 113/81  Pulse: 82 80 80 80  Resp: 20 18 20 20   Temp: 97.8 F (36.6 C) 98.2 F (36.8 C) (!) 97.5 F (36.4 C) 97.7 F (36.5 C)  TempSrc:  Oral Oral Oral  SpO2: 98% 98% 93% 95%  Weight:      Height:        Intake/Output Summary (Last 24 hours) at 07/19/2022 1510 Last data filed at 07/19/2022 1416 Gross per 24 hour  Intake 1440 ml  Output 3450 ml  Net -2010 ml   Filed Weights   07/11/22 1643 07/12/22 1300  Weight: (!) 154 kg (!) 185.1 kg     Data Reviewed:   CBC: Recent Labs  Lab 07/13/22 1024 07/14/22 0329 07/15/22 2352 07/19/22 0455  WBC 6.4 6.4 7.0 7.0  HGB 10.5* 10.5* 10.4* 10.4*  HCT 38.7* 39.0 38.6* 39.0  MCV 79.0* 78.6* 79.3* 78.9*  PLT 245 250 232 0000000   Basic Metabolic Panel: Recent Labs  Lab 07/13/22 1024 07/14/22 0329 07/15/22 2352 07/19/22 0455  NA 139 140 138 139  K 3.9 4.1 4.0 4.1  CL 98 94* 97* 98  CO2 33* 33* 35* 33*  GLUCOSE 123* 136* 121* 101*  BUN 43* 46* 44* 36*  CREATININE 1.46* 1.50* 1.48* 1.38*  CALCIUM 8.1* 8.4* 8.1* 8.2*  MG 2.3 2.2 2.3 2.6*   GFR: Estimated Creatinine Clearance: 94.1 mL/min (A) (by C-G formula based on SCr of 1.38 mg/dL (H)). Liver Function Tests: No results for input(s): "AST", "ALT", "ALKPHOS", "BILITOT", "PROT", "ALBUMIN" in the last 168 hours.  No results for input(s): "LIPASE", "AMYLASE" in the last 168 hours. No results for input(s): "AMMONIA" in the last 168 hours. Coagulation Profile: No results for input(s): "INR", "PROTIME" in the last 168 hours. Cardiac Enzymes: No results for input(s): "CKTOTAL", "CKMB", "CKMBINDEX", "TROPONINI" in the last 168 hours. BNP (last 3 results) No results for input(s): "PROBNP" in the last 8760 hours. HbA1C: No results for input(s): "HGBA1C" in the last 72 hours. CBG: No results for input(s): "GLUCAP" in the last 168 hours. Lipid Profile: No results  for input(s): "CHOL", "HDL", "LDLCALC", "TRIG", "CHOLHDL", "LDLDIRECT" in the last 72 hours. Thyroid Function Tests: No results for input(s): "TSH", "T4TOTAL", "FREET4", "T3FREE", "THYROIDAB" in the last 72 hours. Anemia Panel: No results for input(s): "VITAMINB12", "FOLATE", "FERRITIN", "TIBC", "IRON", "RETICCTPCT" in the last 72 hours. Sepsis Labs: No results for input(s): "PROCALCITON", "LATICACIDVEN" in the last 168 hours.   Recent Results (from the past 240 hour(s))  Resp panel by RT-PCR (RSV, Flu A&B, Covid) Anterior Nasal Swab     Status: None   Collection Time: 07/10/22  7:20 AM   Specimen: Anterior Nasal Swab  Result Value Ref Range Status   SARS Coronavirus 2 by RT PCR NEGATIVE NEGATIVE Final    Comment: (NOTE) SARS-CoV-2 target nucleic acids are NOT DETECTED.  The SARS-CoV-2 RNA is generally detectable in upper respiratory specimens during the acute phase of infection. The lowest concentration of SARS-CoV-2 viral copies this assay can detect is 138 copies/mL. A negative result does not preclude SARS-Cov-2 infection and should not be used as the sole basis for treatment or other patient management decisions. A negative result may occur with  improper specimen collection/handling, submission of specimen other than nasopharyngeal swab, presence of  viral mutation(s) within the areas targeted by this assay, and inadequate number of viral copies(<138 copies/mL). A negative result must be combined with clinical observations, patient history, and epidemiological information. The expected result is Negative.  Fact Sheet for Patients:  EntrepreneurPulse.com.au  Fact Sheet for Healthcare Providers:  IncredibleEmployment.be  This test is no t yet approved or cleared by the [place] FDA and  has been authorized for detection and/or diagnosis of SARS-CoV-2 by FDA under an Emergency Use Authorization (EUA). This EUA will remain  in effect  (meaning this test can be used) for the duration of the COVID-19 declaration under Section 564(b)(1) of the Act, 21 U.S.C.section 360bbb-3(b)(1), unless the authorization is terminated  or revoked sooner.       Influenza A by PCR NEGATIVE NEGATIVE Final   Influenza B by PCR NEGATIVE NEGATIVE Final    Comment: (NOTE) The Xpert Xpress SARS-CoV-2/FLU/RSV plus assay is intended as an aid in the diagnosis of influenza from Nasopharyngeal swab specimens and should not be used as a sole basis for treatment. Nasal washings and aspirates are unacceptable for Xpert Xpress SARS-CoV-2/FLU/RSV testing.  Fact Sheet for Patients: EntrepreneurPulse.com.au  Fact Sheet for Healthcare Providers: IncredibleEmployment.be  This test is not yet approved or cleared by the [place] FDA and has been authorized for detection and/or diagnosis of SARS-CoV-2 by FDA under an Emergency Use Authorization (EUA). This EUA will remain in effect (meaning this test can be used) for the duration of the COVID-19 declaration under Section 564(b)(1) of the Act, 21 U.S.C. section 360bbb-3(b)(1), unless the authorization is terminated or revoked.     Resp Syncytial Virus by PCR NEGATIVE NEGATIVE Final    Comment: (NOTE) Fact Sheet for Patients: EntrepreneurPulse.com.au  Fact Sheet for Healthcare Providers: IncredibleEmployment.be  This test is not yet approved or cleared by the [place] FDA and has been authorized for detection and/or diagnosis of SARS-CoV-2 by FDA under an Emergency Use Authorization (EUA). This EUA will remain in effect (meaning this test can be used) for the duration of the COVID-19 declaration under Section 564(b)(1) of the Act, 21 U.S.C. section 360bbb-3(b)(1), unless the authorization is terminated or revoked.  Performed at [facility], [address] [place], [place] [address]   Culture, blood  (routine x 2)     Status: None   Collection Time: 07/10/22  8:10 AM   Specimen: BLOOD  Result Value Ref Range Status   Specimen Description BLOOD RIGHT [facility]  Final   Special Requests   Final    BOTTLES DRAWN AEROBIC AND ANAEROBIC Blood Culture results may not be optimal due to an excessive volume of blood received in culture bottles   Culture   Final    NO GROWTH 5 DAYS Performed at [facility], [address], [place], [place] [address]    Report Status 07/15/2022 FINAL  Final  Culture, blood (routine x 2)     Status: None   Collection Time: 07/10/22  7:55 PM   Specimen: BLOOD  Result Value Ref Range Status   Specimen Description BLOOD BLOOD LEFT HAND  Final   Special Requests   Final    BOTTLES DRAWN AEROBIC ONLY Blood Culture adequate volume   Culture   Final    NO GROWTH 5 DAYS Performed at [facility], [address], [place], [place] [address]    Report Status 07/15/2022 FINAL  Final  Respiratory (~20 pathogens) panel by PCR     Status: None   Collection Time:  07/11/22 10:40 AM   Specimen: Nasopharyngeal Swab; Respiratory  Result Value Ref Range Status   Adenovirus NOT DETECTED NOT DETECTED Final   Coronavirus 229E NOT DETECTED NOT DETECTED Final    Comment: (NOTE) The Coronavirus on the Respiratory Panel, DOES NOT test for the novel  Coronavirus (2019 nCoV)    Coronavirus HKU1 NOT DETECTED NOT DETECTED Final   Coronavirus NL63 NOT DETECTED NOT DETECTED Final   Coronavirus OC43 NOT DETECTED NOT DETECTED Final   Metapneumovirus NOT DETECTED NOT DETECTED Final   Rhinovirus / Enterovirus NOT DETECTED NOT DETECTED Final   Influenza A NOT DETECTED NOT DETECTED Final   Influenza B NOT DETECTED NOT DETECTED Final   Parainfluenza Virus 1 NOT DETECTED NOT DETECTED Final   Parainfluenza Virus 2 NOT DETECTED NOT DETECTED Final   Parainfluenza Virus 3 NOT DETECTED NOT DETECTED Final   Parainfluenza Virus 4 NOT DETECTED NOT DETECTED Final   Respiratory  Syncytial Virus NOT DETECTED NOT DETECTED Final   Bordetella pertussis NOT DETECTED NOT DETECTED Final   Bordetella Parapertussis NOT DETECTED NOT DETECTED Final   Chlamydophila pneumoniae NOT DETECTED NOT DETECTED Final   Mycoplasma pneumoniae NOT DETECTED NOT DETECTED Final    Comment: Performed at [facility], [place]. [address] [place], [place] [address]         Radiology Studies: No results found.      Scheduled Meds:  apixaban  2.5 mg Oral BID   budesonide (PULMICORT) nebulizer solution  0.5 mg Nebulization BID   escitalopram  5 mg Oral Daily   ferrous sulfate  325 mg Oral Q breakfast   guaiFENesin  600 mg Oral BID   melatonin  5 mg Oral QHS   pantoprazole  40 mg Oral BID AC   sodium chloride flush  3 mL Intravenous Q12H   torsemide  20 mg Oral BID   Continuous Infusions:   LOS: 0 days   Time spent= 25 mins   Ezekiel Slocumb, DO [facility]  If 7PM-7AM, please contact night-coverage  07/19/2022, 3:10 PM

## 2022-07-20 DIAGNOSIS — Z833 Family history of diabetes mellitus: Secondary | ICD-10-CM | POA: Diagnosis not present

## 2022-07-20 DIAGNOSIS — I130 Hypertensive heart and chronic kidney disease with heart failure and stage 1 through stage 4 chronic kidney disease, or unspecified chronic kidney disease: Secondary | ICD-10-CM | POA: Diagnosis present

## 2022-07-20 DIAGNOSIS — J9621 Acute and chronic respiratory failure with hypoxia: Principal | ICD-10-CM

## 2022-07-20 DIAGNOSIS — Z823 Family history of stroke: Secondary | ICD-10-CM | POA: Diagnosis not present

## 2022-07-20 DIAGNOSIS — J9622 Acute and chronic respiratory failure with hypercapnia: Secondary | ICD-10-CM | POA: Diagnosis not present

## 2022-07-20 DIAGNOSIS — Z7901 Long term (current) use of anticoagulants: Secondary | ICD-10-CM | POA: Diagnosis not present

## 2022-07-20 DIAGNOSIS — Z87891 Personal history of nicotine dependence: Secondary | ICD-10-CM | POA: Diagnosis not present

## 2022-07-20 DIAGNOSIS — G4733 Obstructive sleep apnea (adult) (pediatric): Secondary | ICD-10-CM | POA: Diagnosis present

## 2022-07-20 DIAGNOSIS — Z8711 Personal history of peptic ulcer disease: Secondary | ICD-10-CM | POA: Diagnosis not present

## 2022-07-20 DIAGNOSIS — F419 Anxiety disorder, unspecified: Secondary | ICD-10-CM | POA: Diagnosis present

## 2022-07-20 DIAGNOSIS — Z930 Tracheostomy status: Secondary | ICD-10-CM | POA: Diagnosis not present

## 2022-07-20 DIAGNOSIS — Z1152 Encounter for screening for COVID-19: Secondary | ICD-10-CM | POA: Diagnosis not present

## 2022-07-20 DIAGNOSIS — D509 Iron deficiency anemia, unspecified: Secondary | ICD-10-CM | POA: Diagnosis present

## 2022-07-20 DIAGNOSIS — F32A Depression, unspecified: Secondary | ICD-10-CM | POA: Diagnosis present

## 2022-07-20 DIAGNOSIS — Z6841 Body Mass Index (BMI) 40.0 and over, adult: Secondary | ICD-10-CM | POA: Diagnosis not present

## 2022-07-20 DIAGNOSIS — E785 Hyperlipidemia, unspecified: Secondary | ICD-10-CM | POA: Diagnosis present

## 2022-07-20 DIAGNOSIS — I5032 Chronic diastolic (congestive) heart failure: Secondary | ICD-10-CM | POA: Diagnosis present

## 2022-07-20 DIAGNOSIS — Z8616 Personal history of COVID-19: Secondary | ICD-10-CM | POA: Diagnosis not present

## 2022-07-20 DIAGNOSIS — I428 Other cardiomyopathies: Secondary | ICD-10-CM | POA: Diagnosis present

## 2022-07-20 DIAGNOSIS — N1831 Chronic kidney disease, stage 3a: Secondary | ICD-10-CM | POA: Diagnosis present

## 2022-07-20 DIAGNOSIS — J440 Chronic obstructive pulmonary disease with acute lower respiratory infection: Secondary | ICD-10-CM | POA: Diagnosis present

## 2022-07-20 DIAGNOSIS — J209 Acute bronchitis, unspecified: Secondary | ICD-10-CM | POA: Diagnosis present

## 2022-07-20 DIAGNOSIS — J441 Chronic obstructive pulmonary disease with (acute) exacerbation: Secondary | ICD-10-CM | POA: Diagnosis not present

## 2022-07-20 NOTE — Progress Notes (Signed)
Patient allowed RT to assist with being placed on home ventilator. Patient is self sufficient and can perform task, minimal assistance is needed. Home Vent was turned on and circuit tubing was passed to patient, patient then removed his ATM and placed himself on the vent. Patient was observed placing his PMV in the purple container and setting it on his tray table. 4L O2 was bled into the vent per PT request.

## 2022-07-20 NOTE — Progress Notes (Signed)
PROGRESS NOTE    Gary [name]  ZSM:[id number] DOB: 11/26/1955 DOA: 04/26/2022 PCP: [facility]   Brief Narrative:   67 y.o. male with medical history significant of HFpEF with EF of 55-60%, chronic respiratory failure s/p tracheostomy, COPD, hypertension, OSA, hypertension, who presents to the ED due to abdominal pain.  Patient admits of increasing productive cough and rhinorrhea over the past couple of weeks.  Patient has been in the ED for placement for over the last 2 months but while waiting in the ER became hypoxic.  CTA chest showed evidence of bronchial wall thickening concerning for bronchitis but no evidence of PE.  Patient was given dose of IV Lasix due to signs of volume overload and bronchodilators.  Admitted to medical service.  Upon admission COVID/flu/RSV and respiratory panel were negative.  Slowly he was weaned down.  Currently awaiting placement. Improving with PT.    Assessment & Plan:  Principal Problem:   Acute on chronic hypoxic respiratory failure Active Problems:   Acute on chronic respiratory failure with hypoxia and hypercapnia   (HFpEF) heart failure with preserved ejection fraction   Obstructive sleep apnea   Chronic kidney disease, stage 3a   Anxiety and depression   Iron deficiency anemia     Assessment and Plan: * Acute on chronic hypoxic respiratory failure ([name]); stable.  Acute bronchitis/COPD exacerbation; stable.  -Patient was noted to be hypoxic in the ED therefore being admitted and has increasing secretions from his tracheostomy.  Procalcitonin and BNP are negative.  COVID/flu and respiratory panel are negative. - CTA chest negative for pulmonary embolism. - As needed bronchodilators  Unsafe discharge Physical Debility -- no SNF bed offers to due BMI and trach status.  TOC following.  Plans underway to d/c home with maximum outpatient support and DME. Current barrier is patient's inability to come to standing from standard  seat heights (see PT notes).  Patient is not safe for discharge home due to several confounding factors that limit his ability to care for himself, and lacks 24/7 adequate support in the home.  His functional mobility is limited by obesity, generalized deconditioning due to recent prolonged hospitalizations, chronic pain involving bilateral legs, left foot and ankle and bilateral shoulder pain.  He requires MAX assist x 2 or hoyer lift to rise to standing from seat heights less than 24"; he is only able to come to standing with less assistance from raised surfaces (see therapy notes for details).  He does not have sufficient support at home for basic ADL's, obtaining and preparing meals, obtaining medications.   Power WC Evaluation 4/4 - see PT note  Patient suffers from acute/chronic respiratory failure with persistent limitations in strength, endurance and overall mobility that limit his ability to perform daily activities (toileting, dressing, grooming, bathing) and his ability to safely negotiate his home environment. A cane, walker, crutch or manual wheelchair will not resolve the patient's issue with performing these daily activities. Patient to require power WC evaluation (with specific features to address seat height, width, seating surface, positioning, etc) to facilitate the appropriate access to his home environment upon discharge.  (HFpEF) heart failure with preserved ejection fraction ([name]) -Does not appear to be in any acute exacerbation.  Did receive 1 dose of IV Lasix.  On home torsemide, doing well.   Obstructive sleep apnea - Continue home trilogy at bedtime  Chronic kidney disease, stage 3a ([name]) Renal function currently at baseline.   Anxiety and depression - Continue home SSRI  and Atarax  Iron deficiency anemia Hemoglobin currently at baseline. - Continue home iron supplementation.  Bowel regimen as needed  Morbid obesity - Body mass index is 51 kg/m. Complicates overall  care and prognosis.  Recommend lifestyle modifications including physical activity and diet for weight loss and overall long-term health.   See above.    Periodic lab work ordered PT/OT-SNF; ambulatory status slowly improving.   DVT prophylaxis: Eliquis Code Status: Full  Family Communication:   TOC working on placement. Improving with PT  Subjective: Pt sleeping comfortably, woke briefly to voice this AM. Reports feeling fine.  No acute complaints and no acute events reported.  Examination:  General exam: sleeping comfortably, woke to voice, no acute distress, morbidly obese HEENT: trach in place with trach collar, moist mucus membranes, hearing grossly normal  Respiratory system: symmetric chest rise, normal respiratory effort. Cardiovascular system: RRR, intact pedal pulses   Central nervous system: grossly nonfocal, exam limited by somnolence Extremities: moves all, no edema, normal tone Skin: dry, intact, normal temperature Psychiatry: unable to assess due to somnolence    Objective: Vitals:   07/19/22 2314 07/20/22 0521 07/20/22 0737 07/20/22 0803  BP:  106/75 116/75   Pulse:  77 77   Resp:  18 16   Temp:  98 F (36.7 C) 98 F (36.7 C)   TempSrc:      SpO2: 97% 96% 95% 96%  Weight:      Height:        Intake/Output Summary (Last 24 hours) at 07/20/2022 1225 Last data filed at 07/20/2022 1000 Gross per 24 hour  Intake 960 ml  Output 1800 ml  Net -840 ml   Filed Weights   07/11/22 1643 07/12/22 1300  Weight: (!) 154 kg (!) 185.1 kg     Data Reviewed:   CBC: Recent Labs  Lab 07/14/22 0329 07/15/22 2352 07/19/22 0455  WBC 6.4 7.0 7.0  HGB 10.5* 10.4* 10.4*  HCT 39.0 38.6* 39.0  MCV 78.6* 79.3* 78.9*  PLT 250 232 181   Basic Metabolic Panel: Recent Labs  Lab 07/14/22 0329 07/15/22 2352 07/19/22 0455  NA 140 138 139  K 4.1 4.0 4.1  CL 94* 97* 98  CO2 33* 35* 33*  GLUCOSE 136* 121* 101*  BUN 46* 44* 36*  CREATININE 1.50* 1.48* 1.38*   CALCIUM 8.4* 8.1* 8.2*  MG 2.2 2.3 2.6*   GFR: Estimated Creatinine Clearance: 94.1 mL/min (A) (by C-G formula based on SCr of 1.38 mg/dL (H)). Liver Function Tests: No results for input(s): "AST", "ALT", "ALKPHOS", "BILITOT", "PROT", "ALBUMIN" in the last 168 hours.  No results for input(s): "LIPASE", "AMYLASE" in the last 168 hours. No results for input(s): "AMMONIA" in the last 168 hours. Coagulation Profile: No results for input(s): "INR", "PROTIME" in the last 168 hours. Cardiac Enzymes: No results for input(s): "CKTOTAL", "CKMB", "CKMBINDEX", "TROPONINI" in the last 168 hours. BNP (last 3 results) No results for input(s): "PROBNP" in the last 8760 hours. HbA1C: No results for input(s): "HGBA1C" in the last 72 hours. CBG: No results for input(s): "GLUCAP" in the last 168 hours. Lipid Profile: No results for input(s): "CHOL", "HDL", "LDLCALC", "TRIG", "CHOLHDL", "LDLDIRECT" in the last 72 hours. Thyroid Function Tests: No results for input(s): "TSH", "T4TOTAL", "FREET4", "T3FREE", "THYROIDAB" in the last 72 hours. Anemia Panel: No results for input(s): "VITAMINB12", "FOLATE", "FERRITIN", "TIBC", "IRON", "RETICCTPCT" in the last 72 hours. Sepsis Labs: No results for input(s): "PROCALCITON", "LATICACIDVEN" in the last 168 hours.   Recent Results (  from the past 240 hour(s))  Culture, blood (routine x 2)     Status: None   Collection Time: 07/10/22  7:55 PM   Specimen: BLOOD  Result Value Ref Range Status   Specimen Description BLOOD BLOOD LEFT HAND  Final   Special Requests   Final    BOTTLES DRAWN AEROBIC ONLY Blood Culture adequate volume   Culture   Final    NO GROWTH 5 DAYS Performed at [facility], [address] [place], [place] [address]    Report Status 07/15/2022 FINAL  Final  Respiratory (~20 pathogens) panel by PCR     Status: None   Collection Time: 07/11/22 10:40 AM   Specimen: Nasopharyngeal Swab; Respiratory  Result Value Ref Range Status    Adenovirus NOT DETECTED NOT DETECTED Final   Coronavirus 229E NOT DETECTED NOT DETECTED Final    Comment: (NOTE) The Coronavirus on the Respiratory Panel, DOES NOT test for the novel  Coronavirus (2019 nCoV)    Coronavirus HKU1 NOT DETECTED NOT DETECTED Final   Coronavirus NL63 NOT DETECTED NOT DETECTED Final   Coronavirus OC43 NOT DETECTED NOT DETECTED Final   Metapneumovirus NOT DETECTED NOT DETECTED Final   Rhinovirus / Enterovirus NOT DETECTED NOT DETECTED Final   Influenza A NOT DETECTED NOT DETECTED Final   Influenza B NOT DETECTED NOT DETECTED Final   Parainfluenza Virus 1 NOT DETECTED NOT DETECTED Final   Parainfluenza Virus 2 NOT DETECTED NOT DETECTED Final   Parainfluenza Virus 3 NOT DETECTED NOT DETECTED Final   Parainfluenza Virus 4 NOT DETECTED NOT DETECTED Final   Respiratory Syncytial Virus NOT DETECTED NOT DETECTED Final   Bordetella pertussis NOT DETECTED NOT DETECTED Final   Bordetella Parapertussis NOT DETECTED NOT DETECTED Final   Chlamydophila pneumoniae NOT DETECTED NOT DETECTED Final   Mycoplasma pneumoniae NOT DETECTED NOT DETECTED Final    Comment: Performed at [facility], [address], [place], [place] [address]         Radiology Studies: No results found.      Scheduled Meds:  apixaban  2.5 mg Oral BID   budesonide (PULMICORT) nebulizer solution  0.5 mg Nebulization BID   escitalopram  5 mg Oral Daily   ferrous sulfate  325 mg Oral Q breakfast   guaiFENesin  600 mg Oral BID   melatonin  5 mg Oral QHS   pantoprazole  40 mg Oral BID AC   sodium chloride flush  3 mL Intravenous Q12H   torsemide  20 mg Oral BID   Continuous Infusions:   LOS: 0 days   Time spent= 25 mins   Pennie Banter, DO [facility]  If 7PM-7AM, please contact night-coverage  07/20/2022, 12:25 PM

## 2022-07-20 NOTE — TOC Progression Note (Addendum)
Transition of Care [facility]) - Progression Note    Patient Details  Name: Gary [name] MRN: [id number] Date of Birth: 03-26-1956  Transition of Care [facility]) CM/SW Contact  [name], [place] Phone Number: 07/20/2022, 3:00pm  Clinical Narrative:     CSW spoke to patient along with Devin OT who was at bedside to discuss disposition.  CSW informed patient that there is a SNF in [facility] called [facility] who can accept patient if he is agreeable for short term rehab.  CSW explained process and how it would be paid for if he does agree to SNF.  Patient stated he will think about going to the SNF and also discuss with his sisters.  CSW informed patient that if he goes, his Medicare A and B should pay for his stay at SNF and he would get 5-7 days of therapy a week.  Patient felt he would benefit greatly from the rehab.  Patient was also informed that if he chooses not to go to SNF.  [facility] has agreed to accept patient for [facility] services.  Prior to discharge patient will need a hospital bed, bedside commode, rolling walker, and oxygen.  Patient's Trilogy is through [facility], and patient is agreeable to have them provide his other DME as well.  TOC to follow up with patient on Monday to see what he has decided.  CSW also provided information on getting a ramp built for his home.  Expected Discharge Plan: Skilled Nursing Facility Barriers to Discharge: ED Unsafe disposition  Expected Discharge Plan and Services   Discharge Planning Services: CM Consult Post Acute Care Choice: Skilled Nursing Facility Living arrangements for the past 2 months: Single Family Home                 DME Arranged: N/A DME Agency: NA                   Social Determinants of Health (SDOH) Interventions SDOH Screenings   Food Insecurity: No Food Insecurity (04/15/2022)  Housing: Low Risk  (04/15/2022)  Transportation Needs: Unmet Transportation Needs (04/15/2022)  Utilities: Not At Risk (04/15/2022)  Recent  Concern: Utilities - At Risk (02/05/2022)  Depression (PHQ2-9): Low Risk  (06/07/2021)  Financial Resource Strain: Medium Risk (05/03/2017)  Physical Activity: Insufficiently Active (05/03/2017)  Social Connections: Moderately Integrated (05/03/2017)  Stress: No Stress Concern Present (05/03/2017)  Tobacco Use: Medium Risk (04/26/2022)    Readmission Risk Interventions    12/31/2021    8:58 AM 12/20/2021    4:21 PM 05/04/2021    1:26 PM  Readmission Risk Prevention Plan  Transportation Screening Complete Complete Complete  Medication Review Oceanographer) Complete Complete Complete  PCP or Specialist appointment within 3-5 days of discharge Complete Complete Complete  HRI or Home Care Consult Complete Complete Complete  SW Recovery Care/Counseling Consult Complete Complete Complete  Palliative Care Screening Complete Complete Not Applicable  Skilled Nursing Facility Not Applicable Not Applicable Not Applicable

## 2022-07-20 NOTE — Progress Notes (Signed)
Occupational Therapy Treatment Patient Details Name: Gary [name] MRN: [id number] DOB: 12-24-1955 Today's Date: 07/20/2022   History of present illness Gary [name] is a 66yoM who comes to [facility] on 04/26/21 reports his self care needs not being met after prior DC, staffing issues in home and reportedly not able to get his medications. Pt was DC from here 10 days prior after admission for respiratory distress, found to have active influenza infection, also CHF exacerbation. PMH: morbid obesity, CRF s/p tracheostomy c PMV when awake and triology vent support at when sleeping, HTN, COPD   OT comments  Mr Roblyer was seen for OT treatment on this date. Upon arrival to room pt upright in bed, agreeable to tx. Pt defers mobility, reports did not sleep last night and increased swelling. MOD I to perform trach care at bed level. Toelrated therex as described below, reviewed HEP. Educated on DME recs and importance of mobility. Pt making good progress toward goals, will continue to follow POC. Discharge recommendation remains appropriate.     Recommendations for follow up therapy are one component of a multi-disciplinary discharge planning process, led by the attending physician.  Recommendations may be updated based on patient status, additional functional criteria and insurance authorization.    Assistance Recommended at Discharge Frequent or constant Supervision/Assistance  Patient can return home with the following  A lot of help with walking and/or transfers;A lot of help with bathing/dressing/bathroom;Help with stairs or ramp for entrance   Equipment Recommendations  Hospital bed;Wheelchair (measurements OT);BSC/3in1    Recommendations for Other Services      Precautions / Restrictions Precautions Precautions: Fall Restrictions Weight Bearing Restrictions: No       Mobility Bed Mobility               General bed mobility comments: pt deferred    Transfers                    General transfer comment: deferred citing swelling and fatigue (difficulty sleeping after room change process over night)     Balance                                           ADL either performed or assessed with clinical judgement   ADL Overall ADL's : Needs assistance/impaired                                       General ADL Comments: MOD I trach care at bed level      Cognition Arousal/Alertness: Awake/alert Behavior During Therapy: WFL for tasks assessed/performed Overall Cognitive Status: Within Functional Limits for tasks assessed                                          Exercises General Exercises - Lower Extremity Gluteal Sets: AROM, Strengthening, Both, 10 reps, Supine Short Arc Quad: AROM, Strengthening, Both, 10 reps, Supine Hip ABduction/ADduction: AROM, Strengthening, Both, 10 reps, Supine Other Exercises Other Exercises: educated on DME recs            Pertinent Vitals/ Pain       Pain Assessment Pain Assessment: No/denies pain   Frequency  Min 2X/week  Progress Toward Goals  OT Goals(current goals can now be found in the care plan section)  Progress towards OT goals: Progressing toward goals  Acute Rehab OT Goals Patient Stated Goal: to walk OT Goal Formulation: With patient Time For Goal Achievement: 08/14/22 Potential to Achieve Goals: Fair ADL Goals Pt Will Perform Grooming: sitting;with modified independence Pt Will Perform Lower Body Dressing: with min assist;sitting/lateral leans;with adaptive equipment Pt Will Transfer to Toilet: with modified independence;ambulating;bedside commode Pt/caregiver will Perform Home Exercise Program: Increased strength;Both right and left upper extremity;With theraband;With written HEP provided;Independently  Plan Frequency remains appropriate;Discharge plan remains appropriate    Co-evaluation                 AM-PAC OT "6  Clicks" Daily Activity     Outcome Measure   Help from another person eating meals?: None Help from another person taking care of personal grooming?: A Little Help from another person toileting, which includes using toliet, bedpan, or urinal?: A Lot Help from another person bathing (including washing, rinsing, drying)?: A Lot Help from another person to put on and taking off regular upper body clothing?: A Little Help from another person to put on and taking off regular lower body clothing?: A Lot 6 Click Score: 16    End of Session    OT Visit Diagnosis: Other abnormalities of gait and mobility (R26.89);Muscle weakness (generalized) (M62.81)   Activity Tolerance Patient limited by fatigue   Patient Left in bed;with call bell/phone within reach   Nurse Communication          Time: 4496-7591 OT Time Calculation (min): 29 min  Charges: OT General Charges $OT Visit: 1 Visit OT Treatments $Self Care/Home Management : 8-22 mins $Therapeutic Exercise: 8-22 mins  Kathie Dike, M.S. OTR/L  07/20/22, 3:27 PM  ascom [phone]

## 2022-07-20 NOTE — TOC Progression Note (Signed)
Transition of Care [facility]) - Progression Note    Patient Details  Name: Gary [name] MRN: [id number] Date of Birth: 1956/01/20  Transition of Care [facility]) CM/SW Contact  [name], [place] Phone Number: 07/19/2022 2:30pm  Clinical Narrative:     CSW was informed that [facility] in [place], [place], can accept patient if he is agreeable.  They can accept Trach and Trilogy.  CSW to discuss with patient if he is interested in this option.   Expected Discharge Plan: Skilled Nursing Facility Barriers to Discharge: ED Unsafe disposition  Expected Discharge Plan and Services   Discharge Planning Services: CM Consult Post Acute Care Choice: Skilled Nursing Facility Living arrangements for the past 2 months: Single Family Home                 DME Arranged: N/A DME Agency: NA                   Social Determinants of Health (SDOH) Interventions SDOH Screenings   Food Insecurity: No Food Insecurity (04/15/2022)  Housing: Low Risk  (04/15/2022)  Transportation Needs: Unmet Transportation Needs (04/15/2022)  Utilities: Not At Risk (04/15/2022)  Recent Concern: Utilities - At Risk (02/05/2022)  Depression (PHQ2-9): Low Risk  (06/07/2021)  Financial Resource Strain: Medium Risk (05/03/2017)  Physical Activity: Insufficiently Active (05/03/2017)  Social Connections: Moderately Integrated (05/03/2017)  Stress: No Stress Concern Present (05/03/2017)  Tobacco Use: Medium Risk (04/26/2022)    Readmission Risk Interventions    12/31/2021    8:58 AM 12/20/2021    4:21 PM 05/04/2021    1:26 PM  Readmission Risk Prevention Plan  Transportation Screening Complete Complete Complete  Medication Review Oceanographer) Complete Complete Complete  PCP or Specialist appointment within 3-5 days of discharge Complete Complete Complete  HRI or Home Care Consult Complete Complete Complete  SW Recovery Care/Counseling Consult Complete Complete Complete  Palliative Care Screening Complete  Complete Not Applicable  Skilled Nursing Facility Not Applicable Not Applicable Not Applicable

## 2022-07-21 DIAGNOSIS — J9621 Acute and chronic respiratory failure with hypoxia: Secondary | ICD-10-CM | POA: Diagnosis not present

## 2022-07-21 MED ORDER — POLYETHYLENE GLYCOL 3350 17 G PO PACK
17.0000 g | PACK | Freq: Every day | ORAL | Status: DC | PRN
  Administered 2022-07-21: 17 g via ORAL
  Filled 2022-07-21: qty 1

## 2022-07-21 MED ORDER — AMMONIUM LACTATE 12 % EX LOTN
TOPICAL_LOTION | CUTANEOUS | Status: DC | PRN
  Filled 2022-07-21: qty 400

## 2022-07-21 NOTE — Progress Notes (Signed)
Pt placed on home vent at this time

## 2022-07-21 NOTE — Progress Notes (Addendum)
PROGRESS NOTE    Gary [name]  ZSM:[id number] DOB: 11/26/1955 DOA: 04/26/2022 PCP: [facility]   Brief Narrative:   67 y.o. male with medical history significant of HFpEF with EF of 55-60%, chronic respiratory failure s/p tracheostomy, COPD, hypertension, OSA, hypertension, who presents to the ED due to abdominal pain.  Patient admits of increasing productive cough and rhinorrhea over the past couple of weeks.  Patient has been in the ED for placement for over the last 2 months but while waiting in the ER became hypoxic.  CTA chest showed evidence of bronchial wall thickening concerning for bronchitis but no evidence of PE.  Patient was given dose of IV Lasix due to signs of volume overload and bronchodilators.  Admitted to medical service.  Upon admission COVID/flu/RSV and respiratory panel were negative.  Slowly he was weaned down.  Currently awaiting placement. Improving with PT.    Assessment & Plan:  Principal Problem:   Acute on chronic hypoxic respiratory failure Active Problems:   Acute on chronic respiratory failure with hypoxia and hypercapnia   (HFpEF) heart failure with preserved ejection fraction   Obstructive sleep apnea   Chronic kidney disease, stage 3a   Anxiety and depression   Iron deficiency anemia     Assessment and Plan: * Acute on chronic hypoxic respiratory failure ([name]); stable.  Acute bronchitis/COPD exacerbation; stable.  -Patient was noted to be hypoxic in the ED therefore being admitted and has increasing secretions from his tracheostomy.  Procalcitonin and BNP are negative.  COVID/flu and respiratory panel are negative. - CTA chest negative for pulmonary embolism. - As needed bronchodilators  Unsafe discharge Physical Debility -- no SNF bed offers to due BMI and trach status.  TOC following.  Plans underway to d/c home with maximum outpatient support and DME. Current barrier is patient's inability to come to standing from standard  seat heights (see PT notes).  Patient is not safe for discharge home due to several confounding factors that limit his ability to care for himself, and lacks 24/7 adequate support in the home.  His functional mobility is limited by obesity, generalized deconditioning due to recent prolonged hospitalizations, chronic pain involving bilateral legs, left foot and ankle and bilateral shoulder pain.  He requires MAX assist x 2 or hoyer lift to rise to standing from seat heights less than 24"; he is only able to come to standing with less assistance from raised surfaces (see therapy notes for details).  He does not have sufficient support at home for basic ADL's, obtaining and preparing meals, obtaining medications.   Power WC Evaluation 4/4 - see PT note  Patient suffers from acute/chronic respiratory failure with persistent limitations in strength, endurance and overall mobility that limit his ability to perform daily activities (toileting, dressing, grooming, bathing) and his ability to safely negotiate his home environment. A cane, walker, crutch or manual wheelchair will not resolve the patient's issue with performing these daily activities. Patient to require power WC evaluation (with specific features to address seat height, width, seating surface, positioning, etc) to facilitate the appropriate access to his home environment upon discharge.  (HFpEF) heart failure with preserved ejection fraction ([name]) -Does not appear to be in any acute exacerbation.  Did receive 1 dose of IV Lasix.  On home torsemide, doing well.   Obstructive sleep apnea - Continue home trilogy at bedtime  Chronic kidney disease, stage 3a ([name]) Renal function currently at baseline.   Anxiety and depression - Continue home SSRI  and Atarax  Iron deficiency anemia Hemoglobin currently at baseline. - Continue home iron supplementation.  Bowel regimen as needed  Morbid obesity - Body mass index is 51 kg/m. Complicates overall  care and prognosis.  Recommend lifestyle modifications including physical activity and diet for weight loss and overall long-term health.   See above.    Periodic lab work ordered PT/OT-SNF; ambulatory status slowly improving.   DVT prophylaxis: Eliquis Code Status: Full  Family Communication:   TOC working on placement. Improving with PT  Subjective: Pt awake resting in be this AM.  Reports the torsemide is helping get rid of swelling. Asks for cream to help the dead flaky skin on his feet. States finally got a bath this AM, first in a few weeks after being in the ER so long. Asks if bathing can be done every other day.  Examination:  General exam: awake, alert, no acute distress, morbidly obese HEENT: trach in place with trach collar, moist mucus membranes, hearing grossly normal  Respiratory system: symmetric chest rise, normal respiratory effort. Cardiovascular system: RRR, intact pedal pulses   Central nervous system: grossly nonfocal, exam limited by somnolence Extremities: moves all, no edema, normal tone Skin: bilateral feet with dead flaky skin, toenails overgrown Psychiatry: normal mood and affect    Objective: Vitals:   07/20/22 1926 07/20/22 1951 07/21/22 0519 07/21/22 0938  BP:  103/71 (!) 131/94 117/83  Pulse: 82 79 77 79  Resp: 18 18 18 16   Temp:  98.4 F (36.9 C) 98 F (36.7 C) 97.9 F (36.6 C)  TempSrc:  Oral    SpO2: 91% 97% 96% 96%  Weight:      Height:        Intake/Output Summary (Last 24 hours) at 07/21/2022 1205 Last data filed at 07/21/2022 0939 Gross per 24 hour  Intake --  Output 1500 ml  Net -1500 ml   Filed Weights   07/11/22 1643 07/12/22 1300  Weight: (!) 154 kg (!) 185.1 kg     Data Reviewed:   CBC: Recent Labs  Lab 07/15/22 2352 07/19/22 0455  WBC 7.0 7.0  HGB 10.4* 10.4*  HCT 38.6* 39.0  MCV 79.3* 78.9*  PLT 232 181   Basic Metabolic Panel: Recent Labs  Lab 07/15/22 2352 07/19/22 0455  NA 138 139  K 4.0 4.1   CL 97* 98  CO2 35* 33*  GLUCOSE 121* 101*  BUN 44* 36*  CREATININE 1.48* 1.38*  CALCIUM 8.1* 8.2*  MG 2.3 2.6*   GFR: Estimated Creatinine Clearance: 94.1 mL/min (A) (by C-G formula based on SCr of 1.38 mg/dL (H)). Liver Function Tests: No results for input(s): "AST", "ALT", "ALKPHOS", "BILITOT", "PROT", "ALBUMIN" in the last 168 hours.  No results for input(s): "LIPASE", "AMYLASE" in the last 168 hours. No results for input(s): "AMMONIA" in the last 168 hours. Coagulation Profile: No results for input(s): "INR", "PROTIME" in the last 168 hours. Cardiac Enzymes: No results for input(s): "CKTOTAL", "CKMB", "CKMBINDEX", "TROPONINI" in the last 168 hours. BNP (last 3 results) No results for input(s): "PROBNP" in the last 8760 hours. HbA1C: No results for input(s): "HGBA1C" in the last 72 hours. CBG: No results for input(s): "GLUCAP" in the last 168 hours. Lipid Profile: No results for input(s): "CHOL", "HDL", "LDLCALC", "TRIG", "CHOLHDL", "LDLDIRECT" in the last 72 hours. Thyroid Function Tests: No results for input(s): "TSH", "T4TOTAL", "FREET4", "T3FREE", "THYROIDAB" in the last 72 hours. Anemia Panel: No results for input(s): "VITAMINB12", "FOLATE", "FERRITIN", "TIBC", "IRON", "RETICCTPCT" in the  last 72 hours. Sepsis Labs: No results for input(s): "PROCALCITON", "LATICACIDVEN" in the last 168 hours.   No results found for this or any previous visit (from the past 240 hour(s)).        Radiology Studies: No results found.      Scheduled Meds:  apixaban  2.5 mg Oral BID   budesonide (PULMICORT) nebulizer solution  0.5 mg Nebulization BID   escitalopram  5 mg Oral Daily   ferrous sulfate  325 mg Oral Q breakfast   guaiFENesin  600 mg Oral BID   melatonin  5 mg Oral QHS   pantoprazole  40 mg Oral BID AC   sodium chloride flush  3 mL Intravenous Q12H   torsemide  20 mg Oral BID   Continuous Infusions:   LOS: 1 day   Time spent= 35 mins   Pennie Banter, DO [facility]  If 7PM-7AM, please contact night-coverage  07/21/2022, 12:05 PM

## 2022-07-22 DIAGNOSIS — J9621 Acute and chronic respiratory failure with hypoxia: Secondary | ICD-10-CM | POA: Diagnosis not present

## 2022-07-22 LAB — BASIC METABOLIC PANEL
Anion gap: 8 (ref 5–15)
BUN: 40 mg/dL — ABNORMAL HIGH (ref 8–23)
CO2: 32 mmol/L (ref 22–32)
Calcium: 8.3 mg/dL — ABNORMAL LOW (ref 8.9–10.3)
Chloride: 99 mmol/L (ref 98–111)
Creatinine, Ser: 1.42 mg/dL — ABNORMAL HIGH (ref 0.61–1.24)
GFR, Estimated: 55 mL/min — ABNORMAL LOW (ref >60)
Glucose, Bld: 103 mg/dL — ABNORMAL HIGH (ref 70–99)
Potassium: 4.3 mmol/L (ref 3.5–5.1)
Sodium: 139 mmol/L (ref 135–145)

## 2022-07-22 LAB — CBC
HCT: 38 % — ABNORMAL LOW (ref 39.0–52.0)
Hemoglobin: 10.3 g/dL — ABNORMAL LOW (ref 13.0–17.0)
MCH: 21.5 pg — ABNORMAL LOW (ref 26.0–34.0)
MCHC: 27.1 g/dL — ABNORMAL LOW (ref 30.0–36.0)
MCV: 79.2 fL — ABNORMAL LOW (ref 80.0–100.0)
Platelets: 230 10*3/uL (ref 150–400)
RBC: 4.8 MIL/uL (ref 4.22–5.81)
RDW: 17 % — ABNORMAL HIGH (ref 11.5–15.5)
WBC: 7.8 10*3/uL (ref 4.0–10.5)
nRBC: 0 % (ref 0.0–0.2)

## 2022-07-22 LAB — MAGNESIUM: Magnesium: 2.5 mg/dL — ABNORMAL HIGH (ref 1.7–2.4)

## 2022-07-22 NOTE — Progress Notes (Signed)
PROGRESS NOTE    Gary [name]  JTT:[id number] DOB: 12-19-1955 DOA: 04/26/2022 PCP: [facility]   Brief Narrative:   67 y.o. male with medical history significant of HFpEF with EF of 55-60%, chronic respiratory failure s/p tracheostomy, COPD, hypertension, OSA, hypertension, who presents to the ED due to abdominal pain.  Patient admits of increasing productive cough and rhinorrhea over the past couple of weeks.  Patient has been in the ED for placement for over the last 2 months but while waiting in the ER became hypoxic.  CTA chest showed evidence of bronchial wall thickening concerning for bronchitis but no evidence of PE.  Patient was given dose of IV Lasix due to signs of volume overload and bronchodilators.  Admitted to medical service.  Upon admission COVID/flu/RSV and respiratory panel were negative.  Slowly he was weaned down.  Currently awaiting placement. Improving with PT.    Assessment & Plan:  Principal Problem:   Acute on chronic hypoxic respiratory failure Active Problems:   Acute on chronic respiratory failure with hypoxia and hypercapnia   (HFpEF) heart failure with preserved ejection fraction   Obstructive sleep apnea   Chronic kidney disease, stage 3a   Anxiety and depression   Iron deficiency anemia     Assessment and Plan: * Acute on chronic hypoxic respiratory failure ([name]); stable.  Acute bronchitis/COPD exacerbation; stable.  -Patient was noted to be hypoxic in the ED therefore being admitted and has increasing secretions from his tracheostomy.  Procalcitonin and BNP are negative.  COVID/flu and respiratory panel are negative. - CTA chest negative for pulmonary embolism. - As needed bronchodilators  Unsafe discharge Physical Debility -- difficulty placement to due BMI and trach status.  TOC following.  SNF in [place] has offered a bed. If pt is agreeable potential SNF d/c early this week. Otherwise, alternative plans underway to d/c home  with maximum outpatient support and DME. Current barrier is patient's inability to come to standing from standard seat heights (see PT notes).  Patient is not safe for discharge home due to several confounding factors that limit his ability to care for himself, and lacks 24/7 adequate support in the home.  His functional mobility is limited by obesity, generalized deconditioning due to recent prolonged hospitalizations, chronic pain involving bilateral legs, left foot and ankle and bilateral shoulder pain.  He requires MAX assist x 2 or hoyer lift to rise to standing from seat heights less than 24"; he is only able to come to standing with less assistance from raised surfaces (see therapy notes for details).  He does not have sufficient support at home for basic ADL's, obtaining and preparing meals, obtaining medications.   Power WC Evaluation 4/4 - see PT note  Patient suffers from acute/chronic respiratory failure with persistent limitations in strength, endurance and overall mobility that limit his ability to perform daily activities (toileting, dressing, grooming, bathing) and his ability to safely negotiate his home environment. A cane, walker, crutch or manual wheelchair will not resolve the patient's issue with performing these daily activities. Patient to require power WC evaluation (with specific features to address seat height, width, seating surface, positioning, etc) to facilitate the appropriate access to his home environment upon discharge.  (HFpEF) heart failure with preserved ejection fraction ([name]) -Does not appear to be in any acute exacerbation.  Did receive 1 dose of IV Lasix.  On home torsemide, doing well.   Obstructive sleep apnea - Continue home trilogy at bedtime  Chronic kidney disease,  stage 3a ([name]) Renal function currently at baseline.   Anxiety and depression - Continue home SSRI and Atarax  Iron deficiency anemia Hemoglobin currently at baseline. - Continue home  iron supplementation.  Bowel regimen as needed  Morbid obesity - Body mass index is 51 kg/m. Complicates overall care and prognosis.  Recommend lifestyle modifications including physical activity and diet for weight loss and overall long-term health.   See above.    Periodic lab work ordered PT/OT-SNF; ambulatory status slowly improving.   DVT prophylaxis: Eliquis Code Status: Full  Family Communication:   TOC working on placement. Improving with PT  Subjective: Pt awake resting in be this AM.  He expresses frustration about waiting for help and purewick leaking earlier this AM.  We discussed his need to maximize his independence, in preparation to go either home or to rehab this week.  He states that he DOES want to go to the SNF that has offered a bed, wants to regain his strength and mobility.  He wants to speak with them tomorrow, but wants to go.  I recommended he use urinal bottles for voiding instead of purewick, in keeping with him working on regaining his independence.     Examination:  General exam: awake, alert, no acute distress, morbidly obese HEENT: trach in place with trach collar, moist mucus membranes, hearing grossly normal  Respiratory system: symmetric chest rise, normal respiratory effort. Cardiovascular system: RRR, intact pedal pulses   Central nervous system: grossly nonfocal, exam limited by somnolence Extremities: moves all, normal tone, improved skin flaking on feet Psychiatry: normal mood and affect    Objective: Vitals:   07/20/22 1926 07/20/22 1951 07/21/22 0519 07/21/22 0938  BP:  103/71 (!) 131/94 117/83  Pulse: 82 79 77 79  Resp: 18 18 18 16   Temp:  98.4 F (36.9 C) 98 F (36.7 C) 97.9 F (36.6 C)  TempSrc:  Oral    SpO2: 91% 97% 96% 96%  Weight:      Height:        Intake/Output Summary (Last 24 hours) at 07/21/2022 1205 Last data filed at 07/21/2022 0939 Gross per 24 hour  Intake --  Output 1500 ml  Net -1500 ml   Filed Weights    07/11/22 1643 07/12/22 1300  Weight: (!) 154 kg (!) 185.1 kg     Data Reviewed:   CBC: Recent Labs  Lab 07/15/22 2352 07/19/22 0455  WBC 7.0 7.0  HGB 10.4* 10.4*  HCT 38.6* 39.0  MCV 79.3* 78.9*  PLT 232 181   Basic Metabolic Panel: Recent Labs  Lab 07/15/22 2352 07/19/22 0455  NA 138 139  K 4.0 4.1  CL 97* 98  CO2 35* 33*  GLUCOSE 121* 101*  BUN 44* 36*  CREATININE 1.48* 1.38*  CALCIUM 8.1* 8.2*  MG 2.3 2.6*   GFR: Estimated Creatinine Clearance: 94.1 mL/min (A) (by C-G formula based on SCr of 1.38 mg/dL (H)). Liver Function Tests: No results for input(s): "AST", "ALT", "ALKPHOS", "BILITOT", "PROT", "ALBUMIN" in the last 168 hours.  No results for input(s): "LIPASE", "AMYLASE" in the last 168 hours. No results for input(s): "AMMONIA" in the last 168 hours. Coagulation Profile: No results for input(s): "INR", "PROTIME" in the last 168 hours. Cardiac Enzymes: No results for input(s): "CKTOTAL", "CKMB", "CKMBINDEX", "TROPONINI" in the last 168 hours. BNP (last 3 results) No results for input(s): "PROBNP" in the last 8760 hours. HbA1C: No results for input(s): "HGBA1C" in the last 72 hours. CBG: No results for  input(s): "GLUCAP" in the last 168 hours. Lipid Profile: No results for input(s): "CHOL", "HDL", "LDLCALC", "TRIG", "CHOLHDL", "LDLDIRECT" in the last 72 hours. Thyroid Function Tests: No results for input(s): "TSH", "T4TOTAL", "FREET4", "T3FREE", "THYROIDAB" in the last 72 hours. Anemia Panel: No results for input(s): "VITAMINB12", "FOLATE", "FERRITIN", "TIBC", "IRON", "RETICCTPCT" in the last 72 hours. Sepsis Labs: No results for input(s): "PROCALCITON", "LATICACIDVEN" in the last 168 hours.   No results found for this or any previous visit (from the past 240 hour(s)).        Radiology Studies: No results found.      Scheduled Meds:  apixaban  2.5 mg Oral BID   budesonide (PULMICORT) nebulizer solution  0.5 mg Nebulization BID    escitalopram  5 mg Oral Daily   ferrous sulfate  325 mg Oral Q breakfast   guaiFENesin  600 mg Oral BID   melatonin  5 mg Oral QHS   pantoprazole  40 mg Oral BID AC   sodium chloride flush  3 mL Intravenous Q12H   torsemide  20 mg Oral BID   Continuous Infusions:   LOS: 1 day   Time spent= 25 mins   Pennie Banter, DO [facility]  If 7PM-7AM, please contact night-coverage  07/21/2022, 12:05 PM

## 2022-07-22 NOTE — Progress Notes (Signed)
Patient calling front desk multiple times in a row yelling at [employer] operational officer. Entered patient's room to see what he needed. Patient immediately began raising his voice. Asked to lower voice to a normal speaking tone and refrain from yelling at staff members. Patient's male puriwick had come unhooked from the wall, replaced. Gown changed and peri area cleansed. Puriwick remains intact. Patient requesting bed change, bed not soiled or wet. Informed patient it could be changed when he got up to the chair. Offered to assist patient to chair. Patient continued to argue with nurse about bed change, etc. Reiterated the need to be respectful and informed patient that if he didn't want help getting to the chair now, he would be offered assistance again later.

## 2022-07-23 DIAGNOSIS — J9621 Acute and chronic respiratory failure with hypoxia: Secondary | ICD-10-CM | POA: Diagnosis not present

## 2022-07-23 NOTE — TOC Progression Note (Signed)
Transition of Care [facility]) - Progression Note    Patient Details  Name: Gary [name] MRN: [id number] Date of Birth: 1955/09/13  Transition of Care [facility]) CM/SW Contact  [name], [place] Phone Number: 07/23/2022, 10:01 AM  Clinical Narrative:     CSW spoke to [facility] from [facility], [phone], she is reviewing patient for admission to [facility] in [place], [place].  CSW was able to email the appendix C for the SNF to bill Medicaid and get insurance authorization if needed.  TOC to continue to follow patient's progress throughout discharge planning.  Expected Discharge Plan: Skilled Nursing Facility Barriers to Discharge: ED Unsafe disposition  Expected Discharge Plan and Services   Discharge Planning Services: CM Consult Post Acute Care Choice: Skilled Nursing Facility Living arrangements for the past 2 months: Single Family Home                 DME Arranged: N/A DME Agency: NA                   Social Determinants of Health (SDOH) Interventions SDOH Screenings   Food Insecurity: No Food Insecurity (07/21/2022)  Housing: Low Risk  (07/21/2022)  Transportation Needs: Unmet Transportation Needs (07/21/2022)  Utilities: Not At Risk (07/21/2022)  Depression (PHQ2-9): Low Risk  (06/07/2021)  Financial Resource Strain: Medium Risk (05/03/2017)  Physical Activity: Insufficiently Active (05/03/2017)  Social Connections: Moderately Integrated (05/03/2017)  Stress: No Stress Concern Present (05/03/2017)  Tobacco Use: Medium Risk (04/26/2022)    Readmission Risk Interventions    12/31/2021    8:58 AM 12/20/2021    4:21 PM 05/04/2021    1:26 PM  Readmission Risk Prevention Plan  Transportation Screening Complete Complete Complete  Medication Review Oceanographer) Complete Complete Complete  PCP or Specialist appointment within 3-5 days of discharge Complete Complete Complete  HRI or Home Care Consult Complete Complete Complete  SW Recovery Care/Counseling  Consult Complete Complete Complete  Palliative Care Screening Complete Complete Not Applicable  Skilled Nursing Facility Not Applicable Not Applicable Not Applicable

## 2022-07-23 NOTE — Progress Notes (Signed)
Pt placed on ventilator at this time. Trach dressing and inner cannula changed.

## 2022-07-23 NOTE — Progress Notes (Signed)
Physical Therapy Treatment Patient Details Name: Gary [name] MRN: [id number] DOB: [birth year] Today's Date: 07/23/2022   History of Present Illness Gary [name] is a 66yoM who comes [facility] on 04/26/21 reports his self care needs not being met after prior DC, staffing issues in home and reportedly not able to get his medications. Pt was DC from here 10 days prior after admission for respiratory distress, found to have active influenza infection, also CHF exacerbation. PMH: morbid obesity, CRF s/p tracheostomy c PMV when awake and triology vent support at when sleeping, HTN, COPD    PT Comments    Patient is agreeable to PT. He continues to require elevated heights to stand. The patient ambulated a short distance using rolling walker with supervision. Activity tolerance limited by fatigue and reported leg stiffness. Encouraged patient to sit up in the chair for upright conditioning. PT will continue to follow to maximize independence and to decrease caregiver burden.    Recommendations for follow up therapy are one component of a multi-disciplinary discharge planning process, led by the attending physician.  Recommendations may be updated based on patient status, additional functional criteria and insurance authorization.  Follow Up Recommendations       Assistance Recommended at Discharge Intermittent Supervision/Assistance  Patient can return home with the following A little help with walking and/or transfers;A little help with bathing/dressing/bathroom;Assistance with cooking/housework;Assist for transportation;Help with stairs or ramp for entrance   Equipment Recommendations  Wheelchair (measurements PT);BSC/3in1;Rolling walker (2 wheels);Hospital bed    Recommendations for Other Services       Precautions / Restrictions Precautions Precautions: Fall Restrictions Weight Bearing Restrictions: No     Mobility  Bed Mobility Overal bed mobility: Needs Assistance Bed Mobility:  Supine to Sit     Supine to sit: Min assist     General bed mobility comments: assistance for RLE support. cues for technique. heavy use of bed rails for support    Transfers Overall transfer level: Needs assistance Equipment used: Rolling walker (2 wheels) Transfers: Sit to/from Stand Sit to Stand: Min assist           General transfer comment: lifting assistance provided. cues for anterior weight shifting    Ambulation/Gait Ambulation/Gait assistance: Supervision Gait Distance (Feet): 25 Feet Assistive device: Rolling walker (2 wheels) Gait Pattern/deviations: Step-through pattern, Wide base of support, Trunk flexed Gait velocity: decreased     General Gait Details: gait distance self limited as patient reports feeling joint stiffness today in his legs. activity tolerance limited by fatigue.   Stairs             Wheelchair Mobility    Modified Rankin (Stroke Patients Only)       Balance Overall balance assessment: Needs assistance Sitting-balance support: Feet supported Sitting balance-[name] Scale: Normal     Standing balance support: Bilateral upper extremity supported Standing balance-[name] Scale: Fair Standing balance comment: no external support from therapist is required                            Cognition Arousal/Alertness: Awake/alert Behavior During Therapy: WFL for tasks assessed/performed Overall Cognitive Status: Within Functional Limits for tasks assessed                                          Exercises      General Comments General comments (skin  integrity, edema, etc.): patient initially wanted to attempt walking to bathroom to see if he could stand to urinate. encouraged patient to sit up in the chair at end of session for upright conditioning      Pertinent Vitals/Pain Pain Assessment Pain Assessment: No/denies pain    Home Living                          Prior Function             PT Goals (current goals can now be found in the care plan section) Acute Rehab PT Goals Patient Stated Goal: to get a power chair PT Goal Formulation: With patient Time For Goal Achievement: 08/06/22 (care plan extended x 2 weeks) Potential to Achieve Goals: Fair Progress towards PT goals: Progressing toward goals    Frequency    Min 3X/week      PT Plan Current plan remains appropriate    Co-evaluation              AM-PAC PT "6 Clicks" Mobility   Outcome Measure  Help needed turning from your back to your side while in a flat bed without using bedrails?: A Little Help needed moving from lying on your back to sitting on the side of a flat bed without using bedrails?: A Lot Help needed moving to and from a bed to a chair (including a wheelchair)?: A Lot Help needed standing up from a chair using your arms (e.g., wheelchair or bedside chair)?: A Lot Help needed to walk in hospital room?: A Lot Help needed climbing 3-5 steps with a railing? : Total 6 Click Score: 12    End of Session Equipment Utilized During Treatment: Oxygen (4 L via trach collar) Activity Tolerance: Patient limited by pain;Patient limited by fatigue Patient left: in chair;with call bell/phone within reach Nurse Communication: Mobility status PT Visit Diagnosis: Unsteadiness on feet (R26.81);Muscle weakness (generalized) (M62.81);Difficulty in walking, not elsewhere classified (R26.2)     Time: 9767-3419 PT Time Calculation (min) (ACUTE ONLY): 38 min  Charges:  $Therapeutic Activity: 38-52 mins                    [name], PT, MPT    [name] 07/23/2022, 3:33 PM

## 2022-07-23 NOTE — Progress Notes (Signed)
PROGRESS NOTE    Gary [name]  ALP:[id number] DOB: Jul 29, 1955 DOA: 04/26/2022 PCP: [facility]   Brief Narrative:   67 y.o. male with medical history significant of HFpEF with EF of 55-60%, chronic respiratory failure s/p tracheostomy, COPD, hypertension, OSA, hypertension, who presents to the ED due to abdominal pain.  Patient admits of increasing productive cough and rhinorrhea over the past couple of weeks.  Patient has been in the ED for placement for over the last 2 months but while waiting in the ER became hypoxic.  CTA chest showed evidence of bronchial wall thickening concerning for bronchitis but no evidence of PE.  Patient was given dose of IV Lasix due to signs of volume overload and bronchodilators.  Admitted to medical service.  Upon admission COVID/flu/RSV and respiratory panel were negative.  Slowly he was weaned down.  Currently awaiting placement. Improving with PT.    Assessment & Plan:  Principal Problem:   Acute on chronic hypoxic respiratory failure Active Problems:   Acute on chronic respiratory failure with hypoxia and hypercapnia   (HFpEF) heart failure with preserved ejection fraction   Obstructive sleep apnea   Chronic kidney disease, stage 3a   Anxiety and depression   Iron deficiency anemia     Assessment and Plan: * Acute on chronic hypoxic respiratory failure ([name]); stable.  Acute bronchitis/COPD exacerbation; stable.  -Patient was noted to be hypoxic in the ED therefore being admitted and has increasing secretions from his tracheostomy.  Procalcitonin and BNP are negative.  COVID/flu and respiratory panel are negative. - CTA chest negative for pulmonary embolism. - As needed bronchodilators  Unsafe discharge Physical Debility -- difficulty placement to due BMI and trach status.  TOC following.  SNF in [place] has offered a bed. If pt is agreeable potential SNF d/c early this week. Otherwise, alternative plans underway to d/c home  with maximum outpatient support and DME. Current barrier is patient's inability to come to standing from standard seat heights (see PT notes).  Patient is not safe for discharge home due to several confounding factors that limit his ability to care for himself, and lacks 24/7 adequate support in the home.  His functional mobility is limited by obesity, generalized deconditioning due to recent prolonged hospitalizations, chronic pain involving bilateral legs, left foot and ankle and bilateral shoulder pain.  He requires MAX assist x 2 or hoyer lift to rise to standing from seat heights less than 24"; he is only able to come to standing with less assistance from raised surfaces (see therapy notes for details).  He does not have sufficient support at home for basic ADL's, obtaining and preparing meals, obtaining medications.   Power WC Evaluation 4/4 - see PT note  Patient suffers from acute/chronic respiratory failure with persistent limitations in strength, endurance and overall mobility that limit his ability to perform daily activities (toileting, dressing, grooming, bathing) and his ability to safely negotiate his home environment. A cane, walker, crutch or manual wheelchair will not resolve the patient's issue with performing these daily activities. Patient to require power WC evaluation (with specific features to address seat height, width, seating surface, positioning, etc) to facilitate the appropriate access to his home environment upon discharge.  (HFpEF) heart failure with preserved ejection fraction ([name]) -Does not appear to be in any acute exacerbation.  Did receive 1 dose of IV Lasix.  On home torsemide, doing well.   Obstructive sleep apnea - Continue home trilogy at bedtime  Chronic kidney disease,  stage 3a ([name]) Renal function currently at baseline.   Anxiety and depression - Continue home SSRI and Atarax  Iron deficiency anemia Hemoglobin currently at baseline. - Continue home  iron supplementation.  Bowel regimen as needed  Morbid obesity - Body mass index is 51 kg/m. Complicates overall care and prognosis.  Recommend lifestyle modifications including physical activity and diet for weight loss and overall long-term health.   See above.    Periodic lab work ordered PT/OT-SNF; ambulatory status slowly improving.   DVT prophylaxis: Eliquis Code Status: Full  Family Communication:   TOC working on placement. Improving with PT  Subjective: Pt sleeping when seen this AM.  No acute events reported.  Bedside RN reports he is in better spirits today.    Examination:  General exam: sleeping, no acute distress, morbidly obese HEENT: trach in place with trach collar, moist mucus membranes, hearing grossly normal  Respiratory system: symmetric chest rise, normal respiratory effort. Cardiovascular system: RRR, intact pedal pulses   Central nervous system: exam limited by somnolence Extremities: normal tone, no significant edema Psychiatry: unable to evaluate due to somnolence    Objective: Vitals:   07/23/22 0557 07/23/22 0723 07/23/22 0738 07/23/22 0755  BP:   102/67   Pulse:   82   Resp:   20   Temp:   (!) 97.5 F (36.4 C)   TempSrc:      SpO2: 95% 93% (!) 80% 96%  Weight:      Height:        Intake/Output Summary (Last 24 hours) at 07/23/2022 1213 Last data filed at 07/23/2022 1041 Gross per 24 hour  Intake 240 ml  Output 1950 ml  Net -1710 ml   Filed Weights   07/11/22 1643 07/12/22 1300  Weight: (!) 154 kg (!) 185.1 kg     Data Reviewed:   CBC: Recent Labs  Lab 07/19/22 0455 07/22/22 0014  WBC 7.0 7.8  HGB 10.4* 10.3*  HCT 39.0 38.0*  MCV 78.9* 79.2*  PLT 181 230   Basic Metabolic Panel: Recent Labs  Lab 07/19/22 0455 07/22/22 0014  NA 139 139  K 4.1 4.3  CL 98 99  CO2 33* 32  GLUCOSE 101* 103*  BUN 36* 40*  CREATININE 1.38* 1.42*  CALCIUM 8.2* 8.3*  MG 2.6* 2.5*   GFR: Estimated Creatinine Clearance: 91.5 mL/min  (A) (by C-G formula based on SCr of 1.42 mg/dL (H)). Liver Function Tests: No results for input(s): "AST", "ALT", "ALKPHOS", "BILITOT", "PROT", "ALBUMIN" in the last 168 hours.  No results for input(s): "LIPASE", "AMYLASE" in the last 168 hours. No results for input(s): "AMMONIA" in the last 168 hours. Coagulation Profile: No results for input(s): "INR", "PROTIME" in the last 168 hours. Cardiac Enzymes: No results for input(s): "CKTOTAL", "CKMB", "CKMBINDEX", "TROPONINI" in the last 168 hours. BNP (last 3 results) No results for input(s): "PROBNP" in the last 8760 hours. HbA1C: No results for input(s): "HGBA1C" in the last 72 hours. CBG: No results for input(s): "GLUCAP" in the last 168 hours. Lipid Profile: No results for input(s): "CHOL", "HDL", "LDLCALC", "TRIG", "CHOLHDL", "LDLDIRECT" in the last 72 hours. Thyroid Function Tests: No results for input(s): "TSH", "T4TOTAL", "FREET4", "T3FREE", "THYROIDAB" in the last 72 hours. Anemia Panel: No results for input(s): "VITAMINB12", "FOLATE", "FERRITIN", "TIBC", "IRON", "RETICCTPCT" in the last 72 hours. Sepsis Labs: No results for input(s): "PROCALCITON", "LATICACIDVEN" in the last 168 hours.   No results found for this or any previous visit (from the past 240 hour(s)).  Radiology Studies: No results found.      Scheduled Meds:  apixaban  2.5 mg Oral BID   budesonide (PULMICORT) nebulizer solution  0.5 mg Nebulization BID   escitalopram  5 mg Oral Daily   ferrous sulfate  325 mg Oral Q breakfast   guaiFENesin  600 mg Oral BID   melatonin  5 mg Oral QHS   pantoprazole  40 mg Oral BID AC   sodium chloride flush  3 mL Intravenous Q12H   torsemide  20 mg Oral BID   Continuous Infusions:   LOS: 3 days   Time spent= 25 mins   Pennie Banter, DO [facility]  If 7PM-7AM, please contact night-coverage  07/23/2022, 12:13 PM

## 2022-07-24 DIAGNOSIS — J9621 Acute and chronic respiratory failure with hypoxia: Secondary | ICD-10-CM | POA: Diagnosis not present

## 2022-07-24 NOTE — TOC Progression Note (Signed)
Transition of Care [facility]) - Progression Note    Patient Details  Name: Gary [name] MRN: [id number] Date of Birth: November 30, 1955  Transition of Care [facility]) CM/SW Contact  [name], [place] Phone Number: 07/24/2022, 11:07 AM  Clinical Narrative:     Sent message to [facility] to see if they have contacted patient and if they are able to accept patient.  Waiting for decision to be made, TOC to continue to follow patient's progress throughout discharge planning.  Expected Discharge Plan: Skilled Nursing Facility Barriers to Discharge: ED Unsafe disposition  Expected Discharge Plan and Services   Discharge Planning Services: CM Consult Post Acute Care Choice: Skilled Nursing Facility Living arrangements for the past 2 months: Single Family Home                 DME Arranged: N/A DME Agency: NA                   Social Determinants of Health (SDOH) Interventions SDOH Screenings   Food Insecurity: No Food Insecurity (07/21/2022)  Housing: Low Risk  (07/21/2022)  Transportation Needs: Unmet Transportation Needs (07/21/2022)  Utilities: Not At Risk (07/21/2022)  Depression (PHQ2-9): Low Risk  (06/07/2021)  Financial Resource Strain: Medium Risk (05/03/2017)  Physical Activity: Insufficiently Active (05/03/2017)  Social Connections: Moderately Integrated (05/03/2017)  Stress: No Stress Concern Present (05/03/2017)  Tobacco Use: Medium Risk (04/26/2022)    Readmission Risk Interventions    12/31/2021    8:58 AM 12/20/2021    4:21 PM 05/04/2021    1:26 PM  Readmission Risk Prevention Plan  Transportation Screening Complete Complete Complete  Medication Review Oceanographer) Complete Complete Complete  PCP or Specialist appointment within 3-5 days of discharge Complete Complete Complete  HRI or Home Care Consult Complete Complete Complete  SW Recovery Care/Counseling Consult Complete Complete Complete  Palliative Care Screening Complete Complete Not Applicable  Skilled Nursing  Facility Not Applicable Not Applicable Not Applicable

## 2022-07-24 NOTE — Progress Notes (Signed)
Occupational Therapy Treatment Patient Details Name: Gary [name] MRN: [id number] DOB: Nov 02, 1955 Today's Date: 07/24/2022   History of present illness Gary [name] is a 66yoM who comes to [facility] on 04/26/21 reports his self care needs not being met after prior DC, staffing issues in home and reportedly not able to get his medications. Pt was DC from here 10 days prior after admission for respiratory distress, found to have active influenza infection, also CHF exacerbation. PMH: morbid obesity, CRF s/p tracheostomy c PMV when awake and triology vent support at when sleeping, HTN, COPD   OT comments  [name] was seen for OT treatment on this date. Upon arrival to room pt reclined in bed, agreeable to tx. Pt requires MOD A for bathing - completes UB with setup assist, LB bathing with MAX A. CGA + RW for funcitonal mobility ~100 ft x2, stands from 28" chair height. Pt making good progress toward goals, will continue to follow POC. Discharge recommendation remains appropriate.     Recommendations for follow up therapy are one component of a multi-disciplinary discharge planning process, led by the attending physician.  Recommendations may be updated based on patient status, additional functional criteria and insurance authorization.    Assistance Recommended at Discharge Frequent or constant Supervision/Assistance  Patient can return home with the following  A lot of help with walking and/or transfers;A lot of help with bathing/dressing/bathroom;Help with stairs or ramp for entrance   Equipment Recommendations  Hospital bed;Wheelchair (measurements OT);BSC/3in1    Recommendations for Other Services      Precautions / Restrictions Precautions Precautions: Fall Restrictions Weight Bearing Restrictions: No       Mobility Bed Mobility Overal bed mobility: Needs Assistance Bed Mobility: Supine to Sit, Sit to Supine     Supine to sit: Min assist Sit to supine: Mod assist   General bed  mobility comments: assistance for RLE support. cues for technique. heavy use of bed rails for support    Transfers Overall transfer level: Needs assistance Equipment used: Rolling walker (2 wheels) Transfers: Sit to/from Stand Sit to Stand: Min guard                 Balance Overall balance assessment: Needs assistance Sitting-balance support: Feet supported Sitting balance-[name] Scale: Normal     Standing balance support: Bilateral upper extremity supported Standing balance-[name] Scale: Fair Standing balance comment: no external support from therapist is required                           ADL either performed or assessed with clinical judgement   ADL Overall ADL's : Needs assistance/impaired                                       General ADL Comments: MOD A for bathing - completes UB with setup assist, LB bathing with MAX A. CGA + RW for funcitonal mobility ~100 ft x2, stands from 28" chair height      Cognition Arousal/Alertness: Awake/alert Behavior During Therapy: WFL for tasks assessed/performed Overall Cognitive Status: Within Functional Limits for tasks assessed  Pertinent Vitals/ Pain       Pain Assessment Pain Assessment: No/denies pain   Frequency  Min 2X/week        Progress Toward Goals  OT Goals(current goals can now be found in the care plan section)  Progress towards OT goals: Progressing toward goals  Acute Rehab OT Goals Patient Stated Goal: to go home OT Goal Formulation: With patient Time For Goal Achievement: 08/14/22 Potential to Achieve Goals: Fair ADL Goals Pt Will Perform Grooming: sitting;with modified independence Pt Will Perform Lower Body Dressing: with min assist;sitting/lateral leans;with adaptive equipment Pt Will Transfer to Toilet: with modified independence;ambulating;bedside commode Pt/caregiver will Perform Home  Exercise Program: Increased strength;Both right and left upper extremity;With theraband;With written HEP provided;Independently  Plan Discharge plan remains appropriate;Frequency remains appropriate    Co-evaluation                 AM-PAC OT "6 Clicks" Daily Activity     Outcome Measure   Help from another person eating meals?: None Help from another person taking care of personal grooming?: A Little Help from another person toileting, which includes using toliet, bedpan, or urinal?: A Lot Help from another person bathing (including washing, rinsing, drying)?: A Lot Help from another person to put on and taking off regular upper body clothing?: A Little Help from another person to put on and taking off regular lower body clothing?: A Lot 6 Click Score: 16    End of Session    OT Visit Diagnosis: Other abnormalities of gait and mobility (R26.89);Muscle weakness (generalized) (M62.81)   Activity Tolerance Patient tolerated treatment well   Patient Left in bed;with call bell/phone within reach;with nursing/sitter in room   Nurse Communication          Time: 0459-9774 OT Time Calculation (min): 58 min  Charges: OT General Charges $OT Visit: 1 Visit OT Treatments $Self Care/Home Management : 23-37 mins $Therapeutic Activity: 23-37 mins  Kathie Dike, M.S. OTR/L  07/24/22, 4:27 PM  ascom [phone]

## 2022-07-24 NOTE — Progress Notes (Signed)
Placed pt on home vent.

## 2022-07-24 NOTE — Care Management Important Message (Signed)
Important Message  Patient Details  Name: Gary [name] MRN: [id number] Date of Birth: Aug 17, 1955   Medicare Important Message Given:  N/A - LOS <3 / Initial given by admissions     [name] 07/24/2022, 2:31 PM

## 2022-07-24 NOTE — Progress Notes (Signed)
PROGRESS NOTE    Gary [name]  CNG:[id number] DOB: 1955/09/05 DOA: 04/26/2022 PCP: [facility]   Brief Narrative:   67 y.o. male with medical history significant of HFpEF with EF of 55-60%, chronic respiratory failure s/p tracheostomy, COPD, hypertension, OSA, hypertension, who presents to the ED due to abdominal pain.  Patient admits of increasing productive cough and rhinorrhea over the past couple of weeks.  Patient has been in the ED for placement for over the last 2 months but while waiting in the ER became hypoxic.  CTA chest showed evidence of bronchial wall thickening concerning for bronchitis but no evidence of PE.  Patient was given dose of IV Lasix due to signs of volume overload and bronchodilators.  Admitted to medical service.  Upon admission COVID/flu/RSV and respiratory panel were negative.  Slowly he was weaned down.  Currently awaiting placement. Improving with PT.    Assessment & Plan:  Principal Problem:   Acute on chronic hypoxic respiratory failure Active Problems:   Acute on chronic respiratory failure with hypoxia and hypercapnia   (HFpEF) heart failure with preserved ejection fraction   Obstructive sleep apnea   Chronic kidney disease, stage 3a   Anxiety and depression   Iron deficiency anemia     Assessment and Plan: * Acute on chronic hypoxic respiratory failure ([name]); stable.  Acute bronchitis/COPD exacerbation; stable.  -Patient was noted to be hypoxic in the ED therefore being admitted and has increasing secretions from his tracheostomy.  Procalcitonin and BNP are negative.  COVID/flu and respiratory panel are negative. - CTA chest negative for pulmonary embolism. - As needed bronchodilators  Unsafe discharge Physical Debility -- difficulty placement to due BMI and trach status.  TOC following.  SNF in [place] has offered a bed. If pt is agreeable potential SNF d/c early this week. Otherwise, alternative plans underway to d/c home  with maximum outpatient support and DME. Current barrier is patient's inability to come to standing from standard seat heights (see PT notes).  Patient is not safe for discharge home due to several confounding factors that limit his ability to care for himself, and lacks 24/7 adequate support in the home.  His functional mobility is limited by obesity, generalized deconditioning due to recent prolonged hospitalizations, chronic pain involving bilateral legs, left foot and ankle and bilateral shoulder pain.  He requires MAX assist x 2 or hoyer lift to rise to standing from seat heights less than 24"; he is only able to come to standing with less assistance from raised surfaces (see therapy notes for details).  He does not have sufficient support at home for basic ADL's, obtaining and preparing meals, obtaining medications.   Power WC Evaluation 4/4 - see PT note  Patient suffers from acute/chronic respiratory failure with persistent limitations in strength, endurance and overall mobility that limit his ability to perform daily activities (toileting, dressing, grooming, bathing) and his ability to safely negotiate his home environment. A cane, walker, crutch or manual wheelchair will not resolve the patient's issue with performing these daily activities. Patient to require power WC evaluation (with specific features to address seat height, width, seating surface, positioning, etc) to facilitate the appropriate access to his home environment upon discharge.  (HFpEF) heart failure with preserved ejection fraction ([name]) -Does not appear to be in any acute exacerbation.  Did receive 1 dose of IV Lasix.  On home torsemide, doing well.   Obstructive sleep apnea - Continue home trilogy at bedtime  Chronic kidney disease,  stage 3a ([name]) Renal function currently at baseline.   Anxiety and depression - Continue home SSRI and Atarax  Iron deficiency anemia Hemoglobin currently at baseline. - Continue home  iron supplementation.  Bowel regimen as needed  Morbid obesity - Body mass index is 51 kg/m. Complicates overall care and prognosis.  Recommend lifestyle modifications including physical activity and diet for weight loss and overall long-term health.   See above.    Periodic lab work ordered PT/OT-SNF; ambulatory status slowly improving.   DVT prophylaxis: Eliquis Code Status: Full   Dispo: Medically Stable TOC working on placement. Bed offer from SNF in [place].  If this SNF option falls through, discharge home with max HH support and DME - plans underway for both options.   Subjective: Pt was sleeping comfortably when seen this AM.  No acute complaints or acute events reported   Examination:  General exam: sleeping, no acute distress, morbidly obese HEENT: trach in place with trach collar, moist mucus membranes, hearing grossly normal  Respiratory system: symmetric chest rise, normal respiratory effort. Cardiovascular system: RRR, intact pedal pulses   Central nervous system: exam limited by somnolence Extremities: normal tone, no significant edema Psychiatry: unable to evaluate due to somnolence    Objective: Vitals:   07/23/22 2023 07/24/22 0442 07/24/22 0742 07/24/22 0754  BP: 106/75 113/70  121/86  Pulse: 80 84  81  Resp: 18 18  20   Temp: 97.8 F (36.6 C) 97.9 F (36.6 C)  97.9 F (36.6 C)  TempSrc: Oral Oral  Oral  SpO2: 96% 91% 91% 92%  Weight:      Height:        Intake/Output Summary (Last 24 hours) at 07/24/2022 1255 Last data filed at 07/24/2022 0436 Gross per 24 hour  Intake 100 ml  Output 1850 ml  Net -1750 ml   Filed Weights   07/11/22 1643 07/12/22 1300  Weight: (!) 154 kg (!) 185.1 kg     Data Reviewed:   CBC: Recent Labs  Lab 07/19/22 0455 07/22/22 0014  WBC 7.0 7.8  HGB 10.4* 10.3*  HCT 39.0 38.0*  MCV 78.9* 79.2*  PLT 181 230   Basic Metabolic Panel: Recent Labs  Lab 07/19/22 0455 07/22/22 0014  NA 139 139  K 4.1  4.3  CL 98 99  CO2 33* 32  GLUCOSE 101* 103*  BUN 36* 40*  CREATININE 1.38* 1.42*  CALCIUM 8.2* 8.3*  MG 2.6* 2.5*   GFR: Estimated Creatinine Clearance: 91.5 mL/min (A) (by C-G formula based on SCr of 1.42 mg/dL (H)). Liver Function Tests: No results for input(s): "AST", "ALT", "ALKPHOS", "BILITOT", "PROT", "ALBUMIN" in the last 168 hours.  No results for input(s): "LIPASE", "AMYLASE" in the last 168 hours. No results for input(s): "AMMONIA" in the last 168 hours. Coagulation Profile: No results for input(s): "INR", "PROTIME" in the last 168 hours. Cardiac Enzymes: No results for input(s): "CKTOTAL", "CKMB", "CKMBINDEX", "TROPONINI" in the last 168 hours. BNP (last 3 results) No results for input(s): "PROBNP" in the last 8760 hours. HbA1C: No results for input(s): "HGBA1C" in the last 72 hours. CBG: No results for input(s): "GLUCAP" in the last 168 hours. Lipid Profile: No results for input(s): "CHOL", "HDL", "LDLCALC", "TRIG", "CHOLHDL", "LDLDIRECT" in the last 72 hours. Thyroid Function Tests: No results for input(s): "TSH", "T4TOTAL", "FREET4", "T3FREE", "THYROIDAB" in the last 72 hours. Anemia Panel: No results for input(s): "VITAMINB12", "FOLATE", "FERRITIN", "TIBC", "IRON", "RETICCTPCT" in the last 72 hours. Sepsis Labs: No results for input(s): "  PROCALCITON", "LATICACIDVEN" in the last 168 hours.   No results found for this or any previous visit (from the past 240 hour(s)).        Radiology Studies: No results found.      Scheduled Meds:  apixaban  2.5 mg Oral BID   budesonide (PULMICORT) nebulizer solution  0.5 mg Nebulization BID   escitalopram  5 mg Oral Daily   ferrous sulfate  325 mg Oral Q breakfast   guaiFENesin  600 mg Oral BID   melatonin  5 mg Oral QHS   pantoprazole  40 mg Oral BID AC   sodium chloride flush  3 mL Intravenous Q12H   torsemide  20 mg Oral BID   Continuous Infusions:   LOS: 4 days   Time spent 25 mins   Pennie Banter, DO [facility]  If 7PM-7AM, please contact night-coverage  07/24/2022, 12:55 PM

## 2022-07-25 MED ORDER — GUAIFENESIN ER 600 MG PO TB12: 600.0000 mg | ORAL_TABLET | Freq: Two times a day (BID) | ORAL | 0 refills | Status: AC

## 2022-07-25 MED ORDER — TRAZODONE HCL 50 MG PO TABS: 50.0000 mg | ORAL_TABLET | Freq: Every evening | ORAL | 0 refills | Status: DC | PRN

## 2022-07-25 MED ORDER — ACETAMINOPHEN 325 MG PO TABS: 650.0000 mg | ORAL_TABLET | Freq: Four times a day (QID) | ORAL | 0 refills | Status: DC | PRN

## 2022-07-25 NOTE — Progress Notes (Signed)
Ventilator settings are PSV 10/5 with 2L

## 2022-07-25 NOTE — Discharge Summary (Signed)
Physician Discharge Summary   Patient: Gary [name] MRN: [id number] DOB: [birth year]  Admit date:     04/26/2022  Discharge date: 07/25/22  Discharge Physician: Loyce Dys   PCP: [facility]     Discharge Diagnoses: Acute on chronic hypoxic respiratory failure ([name]); stable.  Physical Debility (HFpEF) heart failure with preserved ejection fraction ([name]) Obstructive sleep apnea Chronic kidney disease, stage 3a ([name]) Anxiety and depression Iron deficiency anemia Morbid obesity     Hospital Course:  67 y.o. male with medical history significant of HFpEF with EF of 55-60%, chronic respiratory failure s/p tracheostomy, COPD, hypertension, OSA, hypertension, who presents to the ED due to abdominal pain.  Patient admits of increasing productive cough and rhinorrhea over the past couple of weeks.  Patient has been in the ED for placement for over the last 2 months but while waiting in the ER became hypoxic.  CTA chest showed evidence of bronchial wall thickening concerning for bronchitis but no evidence of PE.  Patient was given dose of IV Lasix due to signs of volume overload and bronchodilators.  Admitted to medical service.  Upon admission COVID/flu/RSV and respiratory panel were negative.  Slowly he was weaned down.  Patient is now back to his baseline and therefore being discharged to rehab.    Consultants: ENT Procedures performed: As stated above Disposition: Skilled nursing facility Diet recommendation:  Cardiac diet DISCHARGE MEDICATION: Allergies as of 07/25/2022   No Known Allergies      Medication List     STOP taking these medications    simvastatin 10 MG tablet Commonly known as: ZOCOR       TAKE these medications    acetaminophen 325 MG tablet Commonly known as: TYLENOL Take 2 tablets (650 mg total) by mouth every 6 (six) hours as needed for mild pain (or Fever >/= 101).   apixaban 2.5 MG Tabs tablet Commonly known as: ELIQUIS Take  1 tablet (2.5 mg total) by mouth 2 (two) times daily.   bisacodyl 10 MG suppository Commonly known as: DULCOLAX Place 1 suppository (10 mg total) rectally daily as needed for moderate constipation.   escitalopram 5 MG tablet Commonly known as: LEXAPRO Take 1 tablet (5 mg total) by mouth daily.   ferrous sulfate 325 (65 FE) MG tablet Take 1 tablet (325 mg total) by mouth daily with breakfast. Home med   guaiFENesin 600 MG 12 hr tablet Commonly known as: MUCINEX Take 1 tablet (600 mg total) by mouth 2 (two) times daily for 10 days.   hydrOXYzine 25 MG tablet Commonly known as: ATARAX Take 1 tablet (25 mg total) by mouth 3 (three) times daily as needed for anxiety.   ipratropium-albuterol 0.5-2.5 (3) MG/3ML Soln Commonly known as: DUONEB Take 3 mLs by nebulization every 6 (six) hours as needed. What changed: Another medication with the same name was removed. Continue taking this medication, and follow the directions you see here.   melatonin 5 MG Tabs Take 1 tablet (5 mg total) by mouth at bedtime.   pantoprazole 40 MG tablet Commonly known as: PROTONIX Take 1 tablet (40 mg total) by mouth 2 (two) times daily before a meal.   polyethylene glycol 17 g packet Commonly known as: MIRALAX / GLYCOLAX Take 17 g by mouth daily as needed for moderate constipation.   torsemide 20 MG tablet Commonly known as: DEMADEX Take 1 tablet (20 mg total) by mouth daily.   traZODone 50 MG tablet Commonly known as: DESYREL Take 1  tablet (50 mg total) by mouth at bedtime as needed for sleep.               Durable Medical Equipment  (From admission, onward)           Start     Ordered   07/18/22 1039  For home use only DME Other see comment  Once       Comments: Power WC Evaluation --- Patient suffers from acute/chronic respiratory failure with persistent limitations in strength, endurance and overall mobility that limit his ability to perform daily activities (toileting, dressing,  grooming, bathing) and his ability to safely negotiate his home environment. A cane, walker, crutch or manual wheelchair will not resolve the patient's issue with performing these daily activities. Patient to require power WC evaluation (with specific features to address seat height, width, seating surface, positioning, etc) to facilitate the appropriate access to his home environment upon discharge.  Question:  Length of Need  Answer:  Lifetime   07/18/22 1039            Discharge Exam: Filed Weights   07/11/22 1643 07/12/22 1300  Weight: (!) 154 kg (!) 185.1 kg   General exam: sleeping, no acute distress, morbidly obese HEENT: trach in place with trach collar, moist mucus membranes Respiratory system: symmetric chest rise, normal respiratory effort. Cardiovascular system: RRR, intact pedal pulses   Central nervous system: exam limited by somnolence Extremities: normal tone, no significant edema Psychiatry: unable to evaluate due to somnolence    Condition at discharge: good   Discharge time spent: greater than 30 minutes.  Signed: Loyce Dys, MD [facility] 07/25/2022    Addendum: Patient remained stable overnight with no acute complaints Vitals have remained stable with temperature 97.9 pulse 78 respiratory rate 16 blood pressure 117/80.  Saturating 100% on 3 L

## 2022-07-25 NOTE — Progress Notes (Signed)
Pt placed back on 28% trach collar. Sats 94%

## 2022-07-25 NOTE — Progress Notes (Signed)
Physical Therapy Treatment Patient Details Name: Gary [name] MRN: [id number] DOB: Mar 01, 1956 Today's Date: 07/25/2022   History of Present Illness Gary [name] is a 66yoM who comes to [facility] on 04/26/21 reports his self care needs not being met after prior DC, staffing issues in home and reportedly not able to get his medications. Pt was DC from here 10 days prior after admission for respiratory distress, found to have active influenza infection, also CHF exacerbation. PMH: morbid obesity, CRF s/p tracheostomy c PMV when awake and triology vent support at when sleeping, HTN, COPD    PT Comments    Patient is agreeable to PT. He was cooperative and motivated to participate. He is hopeful to go to short term rehab. Several standing bouts performed from elevated surfaces. Unable to stand unassisted from 26 inches, however patient is able to stand from 28 inches from the chair using arm supports. He walked the lap around the nursing station with rolling walker with one seated rest break, dyspnea with exertion. He is making progress and could benefit from continued PT to maximize independence and decrease caregiver burden.    Recommendations for follow up therapy are one component of a multi-disciplinary discharge planning process, led by the attending physician.  Recommendations may be updated based on patient status, additional functional criteria and insurance authorization.  Follow Up Recommendations  Can patient physically be transported by private vehicle: No    Assistance Recommended at Discharge Intermittent Supervision/Assistance  Patient can return home with the following A little help with walking and/or transfers;A little help with bathing/dressing/bathroom;Assistance with cooking/housework;Assist for transportation;Help with stairs or ramp for entrance   Equipment Recommendations  Wheelchair (measurements PT);BSC/3in1;Rolling walker (2 wheels);Hospital bed    Recommendations for Other  Services       Precautions / Restrictions Precautions Precautions: Fall Restrictions Weight Bearing Restrictions: No     Mobility  Bed Mobility Overal bed mobility: Needs Assistance Bed Mobility: Supine to Sit     Supine to sit: Min assist     General bed mobility comments: assistance for RLE support. heavy use of bed rails. no assistance from therapist required for trunk support. increased time and effort required    Transfers Overall transfer level: Needs assistance Equipment used: Rolling walker (2 wheels) Transfers: Sit to/from Stand Sit to Stand: Min guard           General transfer comment: the patient was unable to stand without assistance from 26 inches. from recliner chair, patient able to stand from 28 inches with Min guard assistance. verbal cues for hand placement and positioning. multiple standing bouts performed    Ambulation/Gait Ambulation/Gait assistance: Supervision, +2 safety/equipment (second person used for chair follow) Gait Distance (Feet):  (133ft and 27ft) Assistive device: Rolling walker (2 wheels) Gait Pattern/deviations: Step-through pattern Gait velocity: decreased     General Gait Details: chair follow for safety. one seated rest break required to walk the lap around the nursing station. emphasis on taking rest breaks as needed as patient has dyspnea with exertion.   Stairs             Wheelchair Mobility    Modified Rankin (Stroke Patients Only)       Balance Overall balance assessment: Needs assistance Sitting-balance support: Feet supported Sitting balance-[name] Scale: Normal     Standing balance support: Bilateral upper extremity supported Standing balance-[name] Scale: Fair Standing balance comment: no external support from therapist is required  Cognition Arousal/Alertness: Awake/alert Behavior During Therapy: WFL for tasks assessed/performed Overall Cognitive Status:  Within Functional Limits for tasks assessed                                          Exercises      General Comments General comments (skin integrity, edema, etc.): patient remained sitting up in chair at end of session.      Pertinent Vitals/Pain Pain Assessment Pain Assessment: No/denies pain    Home Living                          Prior Function            PT Goals (current goals can now be found in the care plan section) Acute Rehab PT Goals Patient Stated Goal: to go to short term rehab PT Goal Formulation: With patient Time For Goal Achievement: 08/06/22 Potential to Achieve Goals: Fair Progress towards PT goals: Progressing toward goals    Frequency    Min 3X/week      PT Plan Current plan remains appropriate    Co-evaluation              AM-PAC PT "6 Clicks" Mobility   Outcome Measure  Help needed turning from your back to your side while in a flat bed without using bedrails?: A Little Help needed moving from lying on your back to sitting on the side of a flat bed without using bedrails?: A Lot Help needed moving to and from a bed to a chair (including a wheelchair)?: A Lot Help needed standing up from a chair using your arms (e.g., wheelchair or bedside chair)?: A Lot Help needed to walk in hospital room?: A Lot Help needed climbing 3-5 steps with a railing? : Total 6 Click Score: 12    End of Session Equipment Utilized During Treatment: Oxygen (4 L02 via trach collar) Activity Tolerance: Patient tolerated treatment well Patient left: in chair;with call bell/phone within reach Nurse Communication: Mobility status PT Visit Diagnosis: Unsteadiness on feet (R26.81);Muscle weakness (generalized) (M62.81);Difficulty in walking, not elsewhere classified (R26.2)     Time: 0092-3300 PT Time Calculation (min) (ACUTE ONLY): 39 min  Charges:  $Therapeutic Activity: 38-52 mins                     [name], PT,  MPT    [name] 07/25/2022, 12:56 PM

## 2022-07-25 NOTE — [place] FL2 (Signed)
[place] MEDICAID FL2 LEVEL OF CARE FORM     IDENTIFICATION  Patient Name: Gary [name] Birthdate: [birth year] Sex: male Admission Date (Current Location): 04/26/2022  [facility] and [place] Number:  Haynes Bast [id number] Facility and Address:  [facility], [address] [place], [place] [address]      Provider Number: [id number]  Attending Physician Name and Address:  Loyce Dys, MD  Relative Name and Phone Number:  Melbourne Abts Sister   [phone]  Barr,Peggy Sister   [phone]  Corbett,Patricia Sister   [phone]  [name],Ebynee Niece   [phone]  Wright,Tracy Significant other [phone]  [phone]    Current Level of Care: Hospital Recommended Level of Care: Skilled Nursing Facility Prior Approval Number:    Date Approved/Denied:   PASRR Number: [id number]  Discharge Plan: SNF    Current Diagnoses: Patient Active Problem List   Diagnosis Date Noted   Acute on chronic hypoxic respiratory failure 07/10/2022   Acute exacerbation of CHF (congestive heart failure) 04/11/2022   Influenza A 04/11/2022   Hypokalemia 03/06/2022   Acute on chronic diastolic (congestive) heart failure 02/03/2022   Chronic obstructive pulmonary disease (COPD) 02/03/2022   GERD without esophagitis 02/03/2022   Anxiety and depression 02/03/2022   Dyslipidemia 02/03/2022   Bloody stool 11/12/2021   Major depressive disorder, recurrent episode, moderate 10/22/2021   Pressure injury of skin 09/27/2021   Acute on chronic respiratory failure with hypercapnia 08/07/2021   COPD (chronic obstructive pulmonary disease)    HLD (hyperlipidemia)    Iron deficiency anemia    Chronic kidney disease, stage 3a 07/22/2021   Acute on chronic respiratory failure with hypoxia and hypercapnia 07/20/2021   Chronic obstructive pulmonary disease    Acute CHF (congestive heart failure) 04/04/2021   Weakness    Acute respiratory failure with hypoxia and hypercapnia  03/11/2021   (HFpEF) heart failure with preserved ejection fraction    Chronic respiratory failure with hypoxia    Hyperlipidemia 02/19/2021   Acute respiratory distress syndrome (ARDS) due to COVID-19 virus    Acute on chronic respiratory failure 02/18/2021   Generalized osteoarthritis of multiple sites 10/31/2020   AKI (acute kidney injury) 03/04/2020   Hyperkalemia 03/04/2020   COPD with acute exacerbation 03/02/2020   Marijuana use 01/17/2020   Thrombocytopenia 01/17/2020   Positive hepatitis C antibody test 01/17/2020   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 mass index (BMI) of 50.0 to 59.9 in adult 01/17/2020   Osteoarthritis of glenohumeral joints (Bilateral) 01/17/2020   Osteoarthritis of AC (acromioclavicular) joints (Bilateral) 01/17/2020   Polysubstance abuse ([name]) 08/25/2019   Toxic metabolic encephalopathy 08/25/2019   Chronic pain syndrome 07/14/2019   DDD (degenerative disc disease), lumbosacral 07/14/2019   DDD (degenerative disc disease), cervical 07/14/2019   CHF (congestive heart failure) 04/12/2017   Acute on chronic diastolic CHF (congestive heart failure) 02/28/2015   Obstructive sleep apnea 02/28/2015   Acute on chronic diastolic congestive heart failure    Acute on chronic respiratory failure with hypoxia    Morbid obesity with BMI of 50.0-59.9, adult 04/01/2014   HTN (hypertension) 04/01/2014    Orientation RESPIRATION BLADDER Height & Weight     Self, Time, Situation, Place  Tracheostomy, O2 (Trach Collar at 28% and oxygen 5L) External catheter, Incontinent Weight:  (patient refused at this time) Height:  6\' 3"  (190.5 cm)  BEHAVIORAL SYMPTOMS/MOOD NEUROLOGICAL BOWEL NUTRITION STATUS      Continent Diet (Heart Healthy/Carb Modified)  AMBULATORY STATUS COMMUNICATION OF NEEDS Skin   Limited  Assist Verbally Normal                       Personal Care Assistance Level of Assistance  Bathing, Feeding, Dressing Bathing Assistance:  Limited assistance Feeding assistance: Independent Dressing Assistance: Limited assistance Total Care Assistance: Limited assistance   Functional Limitations Info  Sight, Hearing, Speech Sight Info: Adequate Hearing Info: Adequate Speech Info: Adequate    SPECIAL CARE FACTORS FREQUENCY  PT (By licensed PT), OT (By licensed OT)     PT Frequency: Minimum 5x a week OT Frequency: Minimum 5x a week            Contractures Contractures Info: Not present    Additional Factors Info  Code Status, Allergies Code Status Info: Full Code Allergies Info: No Known Allergies Psychotropic Info: escitalopram (LEXAPRO) tablet 5 mg         Current Medications (07/25/2022):  This is the current hospital active medication list Current Facility-Administered Medications  Medication Dose Route Frequency Provider Last Rate Last Admin   acetaminophen (TYLENOL) tablet 650 mg  650 mg Oral Q6H PRN Verdene Lennert, MD   650 mg at 07/25/22 4481   Or   acetaminophen (TYLENOL) suppository 650 mg  650 mg Rectal Q6H PRN Verdene Lennert, MD       ammonium lactate (LAC-HYDRIN) 12 % lotion   Topical PRN Esaw Grandchild A, DO   Given at 07/24/22 0926   apixaban (ELIQUIS) tablet 2.5 mg  2.5 mg Oral BID Angelique Blonder, RPH   2.5 mg at 07/25/22 0933   budesonide (PULMICORT) nebulizer solution 0.5 mg  0.5 mg Nebulization BID Amin, Ankit Chirag, MD   0.5 mg at 07/25/22 0730   escitalopram (LEXAPRO) tablet 5 mg  5 mg Oral Daily Chesley Noon, MD   5 mg at 07/25/22 8563   ferrous sulfate tablet 325 mg  325 mg Oral Q breakfast Chesley Noon, MD   325 mg at 07/25/22 0934   guaiFENesin (MUCINEX) 12 hr tablet 600 mg  600 mg Oral BID Verdene Lennert, MD   600 mg at 07/25/22 0934   hydrALAZINE (APRESOLINE) injection 10 mg  10 mg Intravenous Q4H PRN Amin, Ankit Chirag, MD       hydrOXYzine (ATARAX) tablet 25 mg  25 mg Oral TID PRN Chesley Noon, MD   25 mg at 07/18/22 2130   ipratropium-albuterol (DUONEB) 0.5-2.5  (3) MG/3ML nebulizer solution 3 mL  3 mL Nebulization Q6H PRN Amin, Ankit Chirag, MD   3 mL at 07/24/22 0740   melatonin tablet 5 mg  5 mg Oral QHS Chesley Noon, MD   5 mg at 07/24/22 2122   metoprolol tartrate (LOPRESSOR) injection 5 mg  5 mg Intravenous Q4H PRN Amin, Ankit Chirag, MD       ondansetron (ZOFRAN) tablet 4 mg  4 mg Oral Q6H PRN Verdene Lennert, MD       Or   ondansetron (ZOFRAN) injection 4 mg  4 mg Intravenous Q6H PRN Verdene Lennert, MD       pantoprazole (PROTONIX) EC tablet 40 mg  40 mg Oral BID AC Chesley Noon, MD   40 mg at 07/25/22 0934   polyethylene glycol (MIRALAX / GLYCOLAX) packet 17 g  17 g Oral Daily PRN Esaw Grandchild A, DO   17 g at 07/21/22 1846   torsemide (DEMADEX) tablet 20 mg  20 mg Oral BID Verdene Lennert, MD   20 mg at 07/25/22 0934   traZODone (DESYREL) tablet 50  mg  50 mg Oral QHS PRN Dimple Nanas, MD   50 mg at 07/22/22 2040     Discharge Medications: Please see discharge summary for a list of discharge medications.  Relevant Imaging Results:  Relevant Lab Results:   Additional Information SSN [id number]  [name], LCSW

## 2022-07-25 NOTE — TOC Transition Note (Addendum)
Transition of Care [facility]) - CM/SW Discharge Note   Patient Details  Name: Gary [name] MRN: [id number] Date of Birth: 1955/06/03  Transition of Care [facility]) CM/SW Contact:  [name], [place] Phone Number: 07/26/2022 9am   Clinical Narrative:    TOC spoke to [name], [phone], at [facility] and they can accept patient on Thursday April 11th.  They have ordered a bariatric bed for patient, and they are are aware of patient's Trilogy and Trach at 28% on 5L.  They can meet patient's needs.  TOC spoke to patient and updated him that the plan is to discharge tomorrow for short term rehab.  Patient asked if he would have to turn over his disability check because he was concerned that he may not be able to pay rent if he has to turn over check.  TOC informed him that he is going to SNF under his Medicare A and B for rehab for short term.  TOC explained to him that if he has to stay under LTC at a skilled nursing facility then he would have to turn over his check.  TOC reassured him that the plan is short term rehab and then to eventually get back home.  TOC provided [name] with the contact information [name] at [facility] who has agreed to accept patient once he is ready for discharge from the SNF for home health.  TOC informed [name] the name of the company who is working on getting patient set up with an Mining engineer wheelchair is:  [employer], [address] [place], [place] [address]  [name] (Customer Care Coordinator) [phone] [email or web address]   Patient will be discharging to [facility] [address], [place], [place] [address], [phone] on Thursday July 26, 2022 via [place] [name]) at 9am.     Patient and family agreeable to plans will transport via ems RN to call report to room 109B, [phone].        Final next level of care: Skilled Nursing Facility Barriers to  Discharge: Barriers Resolved   Patient Goals and CMS Choice CMS Medicare.gov Compare Post Acute Care list provided to:: Patient Choice offered to / list presented to : Patient  Discharge Placement  SNF for rehab.   Existing PASRR number confirmed : 07/25/22          Patient chooses bed at: Other - please specify in the comment section below: [facility], [address] [place], [place] [address]) Patient to be transferred to facility by: [place] [name]) Name of family member notified: Patient to notify his family. Patient and family notified of of transfer: 07/25/22  Discharge Plan and Services Additional resources added to the After Visit Summary for     Discharge Planning Services: CM Consult Post Acute Care Choice: Skilled Nursing Facility          DME Arranged: N/A DME Agency: NA                  Social Determinants of Health (SDOH) Interventions SDOH Screenings   Food Insecurity: No Food Insecurity (07/21/2022)  Housing: Low Risk  (07/21/2022)  Transportation Needs: Unmet Transportation Needs (07/21/2022)  Utilities: Not At Risk (07/21/2022)  Depression (PHQ2-9): Low Risk  (06/07/2021)  Financial Resource Strain: Medium Risk (05/03/2017)  Physical Activity: Insufficiently Active (05/03/2017)  Social Connections: Moderately Integrated (05/03/2017)  Stress: No Stress Concern Present (05/03/2017)  Tobacco Use: Medium Risk (04/26/2022)     Readmission Risk Interventions    12/31/2021    8:58 AM 12/20/2021    4:21 PM 05/04/2021    1:26 PM  Readmission Risk Prevention Plan  Transportation Screening Complete Complete Complete  Medication Review Oceanographer) Complete Complete Complete  PCP or Specialist appointment within 3-5 days of discharge Complete Complete Complete  HRI or Home Care Consult Complete Complete Complete  SW Recovery Care/Counseling Consult Complete Complete Complete  Palliative Care  Screening Complete Complete Not Applicable  Skilled Nursing Facility Not Applicable Not Applicable Not Applicable

## 2022-07-25 NOTE — Plan of Care (Signed)
Pt alert and oriented x4. Max assist. Primofit in place. Trach in place with 6L O2 on. Trach setup at bedside. Pt self suctions with yankhauer. Pt to d/c in the morning at 0900 to SNF. Belongings packed and at bedside. Bed locked and in lowest position. Call bell and bedside table within reach.    Problem: Education: Goal: Knowledge of disease or condition will improve 07/25/2022 2313 by Chinita Pester, RN Outcome: Progressing 07/25/2022 2313 by Chinita Pester, RN Outcome: Progressing Goal: Knowledge of the prescribed therapeutic regimen will improve 07/25/2022 2313 by Chinita Pester, RN Outcome: Progressing 07/25/2022 2313 by Chinita Pester, RN Outcome: Progressing Goal: Individualized Educational Video(s) 07/25/2022 2313 by Chinita Pester, RN Outcome: Progressing 07/25/2022 2313 by Chinita Pester, RN Outcome: Progressing   Problem: Activity: Goal: Ability to tolerate increased activity will improve 07/25/2022 2313 by Chinita Pester, RN Outcome: Progressing 07/25/2022 2313 by Chinita Pester, RN Outcome: Progressing Goal: Will verbalize the importance of balancing activity with adequate rest periods 07/25/2022 2313 by Chinita Pester, RN Outcome: Progressing 07/25/2022 2313 by Chinita Pester, RN Outcome: Progressing   Problem: Respiratory: Goal: Ability to maintain a clear airway will improve 07/25/2022 2313 by Chinita Pester, RN Outcome: Progressing 07/25/2022 2313 by Chinita Pester, RN Outcome: Progressing Goal: Levels of oxygenation will improve 07/25/2022 2313 by Chinita Pester, RN Outcome: Progressing 07/25/2022 2313 by Chinita Pester, RN Outcome: Progressing Goal: Ability to maintain adequate ventilation will improve 07/25/2022 2313 by Chinita Pester, RN Outcome: Progressing 07/25/2022 2313 by Chinita Pester, RN Outcome: Progressing   Problem: Education: Goal: Knowledge of General Education information will improve Description:  Including pain rating scale, medication(s)/side effects and non-pharmacologic comfort measures 07/25/2022 2313 by Chinita Pester, RN Outcome: Progressing 07/25/2022 2313 by Chinita Pester, RN Outcome: Progressing   Problem: Health Behavior/Discharge Planning: Goal: Ability to manage health-related needs will improve 07/25/2022 2313 by Chinita Pester, RN Outcome: Progressing 07/25/2022 2313 by Chinita Pester, RN Outcome: Progressing   Problem: Clinical Measurements: Goal: Ability to maintain clinical measurements within normal limits will improve 07/25/2022 2313 by Chinita Pester, RN Outcome: Progressing 07/25/2022 2313 by Chinita Pester, RN Outcome: Progressing Goal: Will remain free from infection 07/25/2022 2313 by Chinita Pester, RN Outcome: Progressing 07/25/2022 2313 by Chinita Pester, RN Outcome: Progressing Goal: Diagnostic test results will improve 07/25/2022 2313 by Chinita Pester, RN Outcome: Progressing 07/25/2022 2313 by Chinita Pester, RN Outcome: Progressing Goal: Respiratory complications will improve 07/25/2022 2313 by Chinita Pester, RN Outcome: Progressing 07/25/2022 2313 by Chinita Pester, RN Outcome: Progressing Goal: Cardiovascular complication will be avoided 07/25/2022 2313 by Chinita Pester, RN Outcome: Progressing 07/25/2022 2313 by Chinita Pester, RN Outcome: Progressing   Problem: Activity: Goal: Risk for activity intolerance will decrease 07/25/2022 2313 by Chinita Pester, RN Outcome: Progressing 07/25/2022 2313 by Chinita Pester, RN Outcome: Progressing   Problem: Nutrition: Goal: Adequate nutrition will be maintained 07/25/2022 2313 by Chinita Pester, RN Outcome: Progressing 07/25/2022 2313 by Chinita Pester, RN Outcome: Progressing   Problem: Coping: Goal: Level of anxiety will decrease 07/25/2022 2313 by Chinita Pester, RN Outcome: Progressing 07/25/2022 2313 by Chinita Pester,  RN Outcome: Progressing   Problem: Elimination: Goal: Will not experience complications related to bowel motility 07/25/2022 2313 by Chinita Pester, RN Outcome: Progressing 07/25/2022 2313 by Chinita Pester, RN Outcome: Progressing Goal: Will not experience complications  related to urinary retention 07/25/2022 2313 by Chinita Pester, RN Outcome: Progressing 07/25/2022 2313 by Chinita Pester, RN Outcome: Progressing   Problem: Pain Managment: Goal: General experience of comfort will improve 07/25/2022 2313 by Chinita Pester, RN Outcome: Progressing 07/25/2022 2313 by Chinita Pester, RN Outcome: Progressing   Problem: Safety: Goal: Ability to remain free from injury will improve 07/25/2022 2313 by Chinita Pester, RN Outcome: Progressing 07/25/2022 2313 by Chinita Pester, RN Outcome: Progressing   Problem: Skin Integrity: Goal: Risk for impaired skin integrity will decrease 07/25/2022 2313 by Chinita Pester, RN Outcome: Progressing 07/25/2022 2313 by Chinita Pester, RN Outcome: Progressing

## 2022-07-26 ENCOUNTER — Other Ambulatory Visit: Payer: Self-pay | Admitting: Internal Medicine

## 2022-07-26 NOTE — Progress Notes (Signed)
Progress Note   Patient: Gary [name] [id number]:[id number] DOB: [birth year] DOA: 04/26/2022     6 DOS: the patient was seen and examined on 07/26/2022     Subjective: Patient seen and examined at bedside this morning Denied any complaints today currently awaiting transportation for skilled nursing facility placement Denies nausea vomiting chest pain or cough   Brief Narrative:   67 y.o. male with medical history significant of HFpEF with EF of 55-60%, chronic respiratory failure s/p tracheostomy, COPD, hypertension, OSA, hypertension, who presents to the ED due to abdominal pain.  Patient admits of increasing productive cough and rhinorrhea over the past couple of weeks.  Patient has been in the ED for placement for over the last 2 months but while waiting in the ER became hypoxic.  CTA chest showed evidence of bronchial wall thickening concerning for bronchitis but no evidence of PE.  Patient was given dose of IV Lasix due to signs of volume overload and bronchodilators.  Admitted to medical service.  Upon admission COVID/flu/RSV and respiratory panel were negative.  Slowly he was weaned down.  Currently awaiting placement. Improving with PT.    Assessment and Plan: * Acute on chronic hypoxic respiratory failure ([name]); stable.  Acute bronchitis/COPD exacerbation; stable.  -Patient was noted to be hypoxic in the ED therefore being admitted and has increasing secretions from his tracheostomy.  Procalcitonin and BNP are negative.  COVID/flu and respiratory panel are negative. - CTA chest negative for pulmonary embolism. - As needed bronchodilators   Unsafe discharge Physical Debility -- difficulty placement to due BMI and trach status.  TOC following.  SNF in [place] has offered a bed. If pt is agreeable potential SNF d/c early this week. Otherwise, alternative plans underway to d/c home with maximum outpatient support and DME. Current barrier is patient's inability to come to standing from  standard seat heights (see PT notes).   Patient is not safe for discharge home due to several confounding factors as documented in progress note for 07/24/2022  (HFpEF) heart failure with preserved ejection fraction ([name]) -Does not appear to be in any acute exacerbation.   Did receive 1 dose of IV Lasix.  On home torsemide, doing well.    Obstructive sleep apnea - Continue home trilogy at bedtime   Chronic kidney disease, stage 3a ([name]) Renal function currently at baseline.    Anxiety and depression - Continue home SSRI and Atarax   Iron deficiency anemia Hemoglobin currently at baseline. - Continue home iron supplementation.  Bowel regimen as needed   Morbid obesity - Body mass index is 51 kg/m. Complicates overall care and prognosis.  Recommend lifestyle modifications including physical activity and diet for weight loss and overall long-term health.   See above.      DVT prophylaxis: Eliquis Code Status: Full       Examination:   General exam: sleeping, no acute distress, morbidly obese HEENT: trach in place with trach collar Respiratory system: symmetric chest rise, normal respiratory effort. Cardiovascular system: RRR, intact pedal pulses   Central nervous system: exam limited by somnolence Extremities: normal tone, no significant edema Psychiatry: unable to evaluate due to somnolence      Vitals:   07/25/22 2014 07/25/22 2048 07/26/22 0501 07/26/22 0830  BP: 107/71  111/80 106/66  Pulse: 86  80 87  Resp: 18  18 18   Temp: 98.1 F (36.7 C)  98.8 F (37.1 C) 98 F (36.7 C)  TempSrc: Oral     SpO2: 93% 94%  100% 95%  Weight:      Height:        Data Reviewed: No labs available for review today  Time spent: 35 minutes  Author: Loyce Dys, MD 07/26/2022 12:40 PM  For on call review [email or web address].

## 2022-07-26 NOTE — Progress Notes (Unsigned)
Progress Note   Patient: Gary [name] [id number] DOB: May 07, 1955 DOA: (Not on file)     0 DOS: the patient was seen and examined on 07/26/2022    Subjective: Patient laying in bed with no active complaint awaiting pickup by transport team to skilled nursing facility Denies nausea vomiting chest pain or cough  Brief Narrative:   67 y.o. male with medical history significant of HFpEF with EF of 55-60%, chronic respiratory failure s/p tracheostomy, COPD, hypertension, OSA, hypertension, who presents to the ED due to abdominal pain.  Patient admits of increasing productive cough and rhinorrhea over the past couple of weeks.  Patient has been in the ED for placement for over the last 2 months but while waiting in the ER became hypoxic.  CTA chest showed evidence of bronchial wall thickening concerning for bronchitis but no evidence of PE.  Patient was given dose of IV Lasix due to signs of volume overload and bronchodilators.  Admitted to medical service.  Upon admission COVID/flu/RSV and respiratory panel were negative.  Slowly he was weaned down.  Currently awaiting placement. Improving with PT.      Assessment & Plan:  Principal Problem:   Acute on chronic hypoxic respiratory failure Active Problems:   Acute on chronic respiratory failure with hypoxia and hypercapnia   (HFpEF) heart failure with preserved ejection fraction   Obstructive sleep apnea   Chronic kidney disease, stage 3a   Anxiety and depression   Iron deficiency anemia       Assessment and Plan: * Acute on chronic hypoxic respiratory failure ([name]); stable.  Acute bronchitis/COPD exacerbation; stable.  -Patient was noted to be hypoxic in the ED therefore being admitted and has increasing secretions from his tracheostomy.  Procalcitonin and BNP are negative.  COVID/flu and respiratory panel are negative. - CTA chest negative for pulmonary embolism. - As needed bronchodilators   Unsafe discharge Physical Debility --  difficulty placement to due BMI and trach status.  TOC following.  SNF in [place] has offered a bed. If pt is agreeable potential SNF d/c early this week. Otherwise, alternative plans underway to d/c home with maximum outpatient support and DME. Current barrier is patient's inability to come to standing from standard seat heights (see PT notes).   Patient is not safe for discharge home due to several confounding factors as listed in progress note for 07/24/2022 Power WC Evaluation 4/4 - see PT note   (HFpEF) heart failure with preserved ejection fraction ([name]) -Does not appear to be in any acute exacerbation.   Did receive 1 dose of IV Lasix.   On home torsemide, doing well.    Obstructive sleep apnea - Continue home trilogy at bedtime   Chronic kidney disease, stage 3a ([name]) Renal function currently at baseline.    Anxiety and depression - Continue home SSRI and Atarax   Iron deficiency anemia Hemoglobin currently at baseline. - Continue home iron supplementation.  Bowel regimen as needed   Morbid obesity - Body mass index is 51 kg/m. Complicates overall care and prognosis.  Recommend lifestyle modifications including physical activity and diet for weight loss and overall long-term health.   See above.     DVT prophylaxis: Eliquis Code Status: Full     Examination:   General exam: sleeping, no acute distress, morbidly obese HEENT: trach in place with trach collar Respiratory system: symmetric chest rise, normal respiratory effort. Cardiovascular system: RRR, intact pedal pulses   Central nervous system: exam limited by somnolence Extremities: normal  tone, no significant edema Psychiatry: unable to evaluate due to somnolence     Data Reviewed: No labs available to review Family Communication: No family at bedside  Disposition: Nursing facility  Time spent: 35 minutes  Author: Loyce Dys, MD 07/26/2022 12:24 PM  For on call review [email or web address].

## 2022-07-26 NOTE — Care Management Important Message (Signed)
Important Message  Patient Details  Name: Gary [name] MRN: [id number] Date of Birth: 11-20-1955   Medicare Important Message Given:  N/A - LOS <3 / Initial given by admissions     Olegario Messier A Tasha Diaz 07/26/2022, 8:05 AM

## 2022-07-26 NOTE — Progress Notes (Signed)
Patient OOF via stretcher in stable condition.

## 2022-07-26 NOTE — Progress Notes (Signed)
Patient report given to RN Alcario Drought. All questions are addressed.

## 2022-08-07 ENCOUNTER — Emergency Department: Payer: Medicare Other

## 2022-08-07 ENCOUNTER — Emergency Department
Admission: EM | Admit: 2022-08-07 | Discharge: 2022-08-08 | Disposition: A | Discharge status: Home / Self Care | Payer: Medicare Other | Attending: Emergency Medicine | Admitting: Emergency Medicine

## 2022-08-07 ENCOUNTER — Other Ambulatory Visit: Payer: Self-pay

## 2022-08-07 DIAGNOSIS — I509 Heart failure, unspecified: Secondary | ICD-10-CM | POA: Insufficient documentation

## 2022-08-07 DIAGNOSIS — R0602 Shortness of breath: Secondary | ICD-10-CM | POA: Insufficient documentation

## 2022-08-07 DIAGNOSIS — I110 Hypertensive heart disease with heart failure: Secondary | ICD-10-CM | POA: Insufficient documentation

## 2022-08-07 LAB — BASIC METABOLIC PANEL
Anion gap: 7 (ref 5–15)
BUN: 19 mg/dL (ref 8–23)
CO2: 38 mmol/L — ABNORMAL HIGH (ref 22–32)
Calcium: 7.9 mg/dL — ABNORMAL LOW (ref 8.9–10.3)
Chloride: 97 mmol/L — ABNORMAL LOW (ref 98–111)
Creatinine, Ser: 1.28 mg/dL — ABNORMAL HIGH (ref 0.61–1.24)
GFR, Estimated: >60 mL/min (ref >60)
Glucose, Bld: 83 mg/dL (ref 70–99)
Potassium: 3.7 mmol/L (ref 3.5–5.1)
Sodium: 142 mmol/L (ref 135–145)

## 2022-08-07 LAB — CBC
HCT: 40.9 % (ref 39.0–52.0)
Hemoglobin: 10.5 g/dL — ABNORMAL LOW (ref 13.0–17.0)
MCH: 20.9 pg — ABNORMAL LOW (ref 26.0–34.0)
MCHC: 25.7 g/dL — ABNORMAL LOW (ref 30.0–36.0)
MCV: 81.3 fL (ref 80.0–100.0)
Platelets: 218 10*3/uL (ref 150–400)
RBC: 5.03 MIL/uL (ref 4.22–5.81)
RDW: 17.4 % — ABNORMAL HIGH (ref 11.5–15.5)
WBC: 5.2 10*3/uL (ref 4.0–10.5)
nRBC: 1.1 % — ABNORMAL HIGH (ref 0.0–0.2)

## 2022-08-07 NOTE — ED Notes (Signed)
Pt on stretcher in triage, yelling at nursing staff stating he feels like his trach cannula is clogged. RRT Lurena Joiner called at this time and will be down as able to assess. No distress noted to pt at this time.

## 2022-08-07 NOTE — ED Notes (Signed)
Pt O2 tank changed at this time. Pt on 8L O2 via [place].  

## 2022-08-07 NOTE — ED Provider Notes (Signed)
[facility] Provider Note   Event Date/Time   First MD Initiated Contact with Patient 08/07/22 2157     (approximate) History  Shortness of Breath  HPI ROMIN DIVITA is a 67 y.o. male with a past medical history of trach dependence degenerative disc disease, morbid obesity, hypertension, CHF, and polysubstance abuse who presents from outside hospital in [place] after he complained of shortness of breath during EMS transport home and requested to come to our facility.  Patient states that he was diagnosed with pneumonia at the facility earlier today however paperwork at bedside does not show any diagnosis of pneumonia.  Patient has multiple other complaints including a bedsore for which she requests a bariatric bed as well as stating that he has not eaten since 4:00 this morning and requesting food ROS: Patient currently denies any vision changes, tinnitus, difficulty speaking, facial droop, sore throat, chest pain, shortness of breath, abdominal pain, nausea/vomiting/diarrhea, dysuria, or weakness/numbness/paresthesias in any extremity   Physical Exam  Triage Vital Signs: ED Triage Vitals  Enc Vitals Group     BP 08/07/22 1736 118/71     Pulse Rate 08/07/22 1736 85     Resp 08/07/22 1736 18     Temp 08/07/22 1736 98.1 F (36.7 C)     Temp Source 08/07/22 1736 Oral     SpO2 08/07/22 1736 95 %     Weight --      Height --      Head Circumference --      Peak Flow --      Pain Score 08/07/22 1652 0     Pain Loc --      Pain Edu? --      Excl. in GC? --    Most recent vital signs: Vitals:   08/07/22 1736 08/07/22 2239  BP: 118/71   Pulse: 85   Resp: 18   Temp: 98.1 F (36.7 C)   SpO2: 95% 96%   General: Awake, oriented x4. CV:  Good peripheral perfusion.  Resp:  Normal effort.  Trach in place Abd:  No distention.  Other:  Morbidly obese elderly African-American male laying in bed in no acute distress ED Results / Procedures / Treatments   Labs (all labs ordered are listed, but only abnormal results are displayed) Labs Reviewed  BASIC METABOLIC PANEL - Abnormal; Notable for the following components:      Result Value   Chloride 97 (*)    CO2 38 (*)    Creatinine, Ser 1.28 (*)    Calcium 7.9 (*)    All other components within normal limits  CBC - Abnormal; Notable for the following components:   Hemoglobin 10.5 (*)    MCH 20.9 (*)    MCHC 25.7 (*)    RDW 17.4 (*)    nRBC 1.1 (*)    All other components within normal limits   EKG ED ECG REPORT I, Merwyn Katos, the attending physician, personally viewed and interpreted this ECG. Date: 08/07/2022 EKG Time: 1733 Rate: 84 Rhythm: normal sinus rhythm QRS Axis: normal Intervals: normal ST/T Wave abnormalities: normal Narrative Interpretation: no evidence of acute ischemia RADIOLOGY ED MD interpretation: 2 view chest x-ray interpreted by me shows chronic cardiomegaly with vascular congestion and mild edema. no evidence of acute abnormalities including no pneumonia, pneumothorax, or widened mediastinum -Agree with radiology assessment Official radiology report(s): DG Chest 2 View  Result Date: 08/07/2022 CLINICAL DATA:  Shortness of breath, low O2 sats EXAM:  CHEST - 2 VIEW COMPARISON:  07/10/2022 FINDINGS: Cardiomegaly with vascular congestion. Diffuse interstitial prominence likely reflects interstitial edema. Right mid lung atelectasis. No visible effusions or pneumothorax. Old left rib fractures. No acute bony abnormality. IMPRESSION: Cardiomegaly with vascular congestion and mild interstitial edema. Electronically Signed   By: Charlett Nose M.D.   On: 08/07/2022 17:25   PROCEDURES: Critical Care performed: No .1-3 Lead EKG Interpretation  Performed by: Merwyn Katos, MD Authorized by: Merwyn Katos, MD     Interpretation: normal     ECG rate:  71   ECG rate assessment: normal     Rhythm: sinus rhythm     Ectopy: none     Conduction: normal     MEDICATIONS ORDERED IN ED: Medications - No data to display IMPRESSION / MDM / ASSESSMENT AND PLAN / ED COURSE  I reviewed the triage vital signs and the nursing notes.                             The patient is on the cardiac monitor to evaluate for evidence of arrhythmia and/or significant heart rate changes. Patient's presentation is most consistent with acute presentation with potential threat to life or bodily function. Pt is a 67yo male well known to our facility who presents from an OSH via EMS after complaints of shortness of breath and patient requested to be transported to our facility.  There is no evidence of pneumonia on his chest x-ray.  Patient is satting well on room air and other vital signs stable.  Patient is stable for discharge at this time with home health resources that have reportedly already been set up.  Barrier to discharge this evening has been inability for home health staff to meet him at home to set up his supplies.  Patient will be boarding in our department until it is safe for transport home.  Dispo pending discharge   FINAL CLINICAL IMPRESSION(S) / ED DIAGNOSES   Final diagnoses:  SOB (shortness of breath)   Rx / DC Orders   ED Discharge Orders     None      Note:  This document was prepared using Dragon voice recognition software and may include unintentional dictation errors.   Merwyn Katos, MD 08/07/22 [phone]

## 2022-08-07 NOTE — ED Notes (Signed)
Pt O2 tank changed at this time. Pt on 8L O2 via [place].

## 2022-08-07 NOTE — ED Notes (Signed)
Pt continues to yell at triage staff.States he needs turned and his throat hurts. Advised pt that he is able to turn and reposition himself as able as he was ambulatory upon his last visit and was being dc'd to home from rehab facility today. Pt continues to talk over this nurse, stating " you aint doing nothing for me, get me doctor. You aint helping me. I aint yelling but you need to help me.I been here since 4oclock." This nurse again advised pt to lower his voice, attempting to educate pt that he is in triage and will be moved to a room once available. Pt continues to yell without letting this nurse complete sentence. Advised pt current conversation is non productive at this time and his actions towards staff are not acceptable and nurse will continue conversation when pt is willing to be respectful. Security at front desk made aware of interactions and requested to speak with pt regarding yelling.

## 2022-08-07 NOTE — ED Triage Notes (Signed)
Pt comes with c/o sob. Pt brought in by a convo truck. Pt was brought here due to O2 and not looking right.  Pt was picked up from [facility] to transport to [employer] pt's residence. In route pt stated sob and was asked if he wanted to hospital and pt stated yes.   BP-96/78 62-Hr 89% on trach on 8L

## 2022-08-08 NOTE — ED Notes (Signed)
Patient assisted back into bed from end of bed where he as been sitting since 0545. 3 RN staff members assisted.

## 2022-08-08 NOTE — ED Notes (Signed)
Pt placed on bedpan

## 2022-08-08 NOTE — ED Notes (Signed)
[facility] called for transport home 

## 2022-08-08 NOTE — ED Notes (Signed)
Pt given a breakfast tray.

## 2022-08-08 NOTE — ED Notes (Signed)
RN in patient room to assist in repositioning. Patient requesting a different bed. Patient states "why are yall doing me like this? Why are yall making me lay on this hard bed"? RN assured patient, no one is treating him inappropriate and every patient that is cared for in the ED utilizes the same type of stretchers. RN offered to have charge RN come into to speak with patient about bed. Patient declined after asking "who's charge"? Then stating "that's that white girl? None of them white girls like me".

## 2022-08-08 NOTE — TOC CM/SW Note (Signed)
Patient was supposed to start services with [facility] today. Liaison is aware that he is here and that nurse secretary has called transport to get him home.  Charlynn Court, CSW [phone]

## 2022-08-08 NOTE — ED Notes (Signed)
Patient ambulated to hospital bed using walker. Peri care provided for patient during ambulation. Pt repositioned in bed and given call light. Pt placed breakfast order.

## 2022-08-08 NOTE — ED Notes (Signed)
Patient consistently presses call bell and then proceeds to yell out "NURSE" over and over until someone goes into the room.

## 2022-08-12 ENCOUNTER — Other Ambulatory Visit: Payer: Self-pay

## 2022-08-12 ENCOUNTER — Emergency Department (HOSPITAL_COMMUNITY): Payer: Medicare Other

## 2022-08-12 ENCOUNTER — Inpatient Hospital Stay (HOSPITAL_COMMUNITY)
Admission: EM | Admit: 2022-08-12 | Discharge: 2022-08-21 | DRG: 291 | Disposition: A | Discharge status: Other Acute Inpatient Hospital | Payer: Medicare Other | Attending: Internal Medicine | Admitting: Internal Medicine

## 2022-08-12 DIAGNOSIS — Z7401 Bed confinement status: Secondary | ICD-10-CM

## 2022-08-12 DIAGNOSIS — N1832 Chronic kidney disease, stage 3b: Secondary | ICD-10-CM | POA: Diagnosis present

## 2022-08-12 DIAGNOSIS — E874 Mixed disorder of acid-base balance: Secondary | ICD-10-CM | POA: Diagnosis present

## 2022-08-12 DIAGNOSIS — Z8616 Personal history of COVID-19: Secondary | ICD-10-CM

## 2022-08-12 DIAGNOSIS — Z9911 Dependence on respirator [ventilator] status: Secondary | ICD-10-CM

## 2022-08-12 DIAGNOSIS — R7302 Impaired glucose tolerance (oral): Secondary | ICD-10-CM | POA: Diagnosis present

## 2022-08-12 DIAGNOSIS — E876 Hypokalemia: Secondary | ICD-10-CM | POA: Diagnosis present

## 2022-08-12 DIAGNOSIS — Z7901 Long term (current) use of anticoagulants: Secondary | ICD-10-CM

## 2022-08-12 DIAGNOSIS — Z833 Family history of diabetes mellitus: Secondary | ICD-10-CM

## 2022-08-12 DIAGNOSIS — I509 Heart failure, unspecified: Secondary | ICD-10-CM

## 2022-08-12 DIAGNOSIS — Z515 Encounter for palliative care: Secondary | ICD-10-CM

## 2022-08-12 DIAGNOSIS — G4733 Obstructive sleep apnea (adult) (pediatric): Secondary | ICD-10-CM | POA: Insufficient documentation

## 2022-08-12 DIAGNOSIS — D631 Anemia in chronic kidney disease: Secondary | ICD-10-CM | POA: Diagnosis present

## 2022-08-12 DIAGNOSIS — R0902 Hypoxemia: Principal | ICD-10-CM

## 2022-08-12 DIAGNOSIS — Z930 Tracheostomy status: Secondary | ICD-10-CM

## 2022-08-12 DIAGNOSIS — Z87891 Personal history of nicotine dependence: Secondary | ICD-10-CM

## 2022-08-12 DIAGNOSIS — Z6841 Body Mass Index (BMI) 40.0 and over, adult: Secondary | ICD-10-CM

## 2022-08-12 DIAGNOSIS — Z1152 Encounter for screening for COVID-19: Secondary | ICD-10-CM

## 2022-08-12 DIAGNOSIS — I2781 Cor pulmonale (chronic): Secondary | ICD-10-CM | POA: Diagnosis present

## 2022-08-12 DIAGNOSIS — Z823 Family history of stroke: Secondary | ICD-10-CM

## 2022-08-12 DIAGNOSIS — F419 Anxiety disorder, unspecified: Secondary | ICD-10-CM | POA: Diagnosis present

## 2022-08-12 DIAGNOSIS — J984 Other disorders of lung: Secondary | ICD-10-CM | POA: Diagnosis present

## 2022-08-12 DIAGNOSIS — F32A Depression, unspecified: Secondary | ICD-10-CM | POA: Diagnosis present

## 2022-08-12 DIAGNOSIS — I5033 Acute on chronic diastolic (congestive) heart failure: Secondary | ICD-10-CM | POA: Diagnosis present

## 2022-08-12 DIAGNOSIS — I7143 Infrarenal abdominal aortic aneurysm, without rupture: Secondary | ICD-10-CM | POA: Diagnosis present

## 2022-08-12 DIAGNOSIS — E662 Morbid (severe) obesity with alveolar hypoventilation: Secondary | ICD-10-CM | POA: Diagnosis present

## 2022-08-12 DIAGNOSIS — J9621 Acute and chronic respiratory failure with hypoxia: Secondary | ICD-10-CM | POA: Diagnosis present

## 2022-08-12 DIAGNOSIS — I878 Other specified disorders of veins: Secondary | ICD-10-CM | POA: Diagnosis present

## 2022-08-12 DIAGNOSIS — Z5986 Financial insecurity: Secondary | ICD-10-CM

## 2022-08-12 DIAGNOSIS — R079 Chest pain, unspecified: Secondary | ICD-10-CM

## 2022-08-12 DIAGNOSIS — N1831 Chronic kidney disease, stage 3a: Secondary | ICD-10-CM | POA: Diagnosis present

## 2022-08-12 DIAGNOSIS — I130 Hypertensive heart and chronic kidney disease with heart failure and stage 1 through stage 4 chronic kidney disease, or unspecified chronic kidney disease: Principal | ICD-10-CM | POA: Diagnosis present

## 2022-08-12 DIAGNOSIS — Z8711 Personal history of peptic ulcer disease: Secondary | ICD-10-CM

## 2022-08-12 DIAGNOSIS — G4730 Sleep apnea, unspecified: Secondary | ICD-10-CM | POA: Insufficient documentation

## 2022-08-12 DIAGNOSIS — J449 Chronic obstructive pulmonary disease, unspecified: Secondary | ICD-10-CM | POA: Diagnosis present

## 2022-08-12 DIAGNOSIS — Z79899 Other long term (current) drug therapy: Secondary | ICD-10-CM

## 2022-08-12 DIAGNOSIS — Z5982 Transportation insecurity: Secondary | ICD-10-CM

## 2022-08-12 DIAGNOSIS — R072 Precordial pain: Secondary | ICD-10-CM | POA: Diagnosis present

## 2022-08-12 DIAGNOSIS — I428 Other cardiomyopathies: Secondary | ICD-10-CM | POA: Diagnosis present

## 2022-08-12 LAB — RESP PANEL BY RT-PCR (RSV, FLU A&B, COVID)  RVPGX2
Influenza A by PCR: NEGATIVE
Influenza B by PCR: NEGATIVE
Resp Syncytial Virus by PCR: NEGATIVE
SARS Coronavirus 2 by RT PCR: NEGATIVE

## 2022-08-12 LAB — CBC WITH DIFFERENTIAL/PLATELET
Abs Immature Granulocytes: 0.03 10*3/uL (ref 0.00–0.07)
Basophils Absolute: 0 10*3/uL (ref 0.0–0.1)
Basophils Relative: 1 %
Eosinophils Absolute: 0.2 10*3/uL (ref 0.0–0.5)
Eosinophils Relative: 2 %
HCT: 39.8 % (ref 39.0–52.0)
Hemoglobin: 9.9 g/dL — ABNORMAL LOW (ref 13.0–17.0)
Immature Granulocytes: 0 %
Lymphocytes Relative: 19 %
Lymphs Abs: 1.5 10*3/uL (ref 0.7–4.0)
MCH: 20.5 pg — ABNORMAL LOW (ref 26.0–34.0)
MCHC: 24.9 g/dL — ABNORMAL LOW (ref 30.0–36.0)
MCV: 82.4 fL (ref 80.0–100.0)
Monocytes Absolute: 0.8 10*3/uL (ref 0.1–1.0)
Monocytes Relative: 10 %
Neutro Abs: 5.2 10*3/uL (ref 1.7–7.7)
Neutrophils Relative %: 68 %
Platelets: 253 10*3/uL (ref 150–400)
RBC: 4.83 MIL/uL (ref 4.22–5.81)
RDW: 18 % — ABNORMAL HIGH (ref 11.5–15.5)
WBC: 7.6 10*3/uL (ref 4.0–10.5)
nRBC: 0.3 % — ABNORMAL HIGH (ref 0.0–0.2)

## 2022-08-12 LAB — COMPREHENSIVE METABOLIC PANEL
ALT: 12 U/L (ref 0–44)
AST: 13 U/L — ABNORMAL LOW (ref 15–41)
Albumin: 2.7 g/dL — ABNORMAL LOW (ref 3.5–5.0)
Alkaline Phosphatase: 97 U/L (ref 38–126)
Anion gap: 9 (ref 5–15)
BUN: 15 mg/dL (ref 8–23)
CO2: 38 mmol/L — ABNORMAL HIGH (ref 22–32)
Calcium: 8 mg/dL — ABNORMAL LOW (ref 8.9–10.3)
Chloride: 97 mmol/L — ABNORMAL LOW (ref 98–111)
Creatinine, Ser: 1.39 mg/dL — ABNORMAL HIGH (ref 0.61–1.24)
GFR, Estimated: 56 mL/min — ABNORMAL LOW (ref >60)
Glucose, Bld: 79 mg/dL (ref 70–99)
Potassium: 3.5 mmol/L (ref 3.5–5.1)
Sodium: 144 mmol/L (ref 135–145)
Total Bilirubin: 0.3 mg/dL (ref 0.3–1.2)
Total Protein: 7.2 g/dL (ref 6.5–8.1)

## 2022-08-12 LAB — I-STAT VENOUS BLOOD GAS, ED
Acid-Base Excess: 16 mmol/L — ABNORMAL HIGH (ref 0.0–2.0)
Bicarbonate: 45 mmol/L — ABNORMAL HIGH (ref 20.0–28.0)
Calcium, Ion: 1.03 mmol/L — ABNORMAL LOW (ref 1.15–1.40)
HCT: 38 % — ABNORMAL LOW (ref 39.0–52.0)
Hemoglobin: 12.9 g/dL — ABNORMAL LOW (ref 13.0–17.0)
O2 Saturation: 57 %
Potassium: 3.5 mmol/L (ref 3.5–5.1)
Sodium: 145 mmol/L (ref 135–145)
TCO2: 47 mmol/L — ABNORMAL HIGH (ref 22–32)
pCO2, Ven: 75.8 mmHg (ref 44–60)
pH, Ven: 7.382 (ref 7.25–7.43)
pO2, Ven: 32 mmHg (ref 32–45)

## 2022-08-12 LAB — LACTIC ACID, PLASMA: Lactic Acid, Venous: 1 mmol/L (ref 0.5–1.9)

## 2022-08-12 LAB — BRAIN NATRIURETIC PEPTIDE: B Natriuretic Peptide: 27.7 pg/mL (ref 0.0–100.0)

## 2022-08-12 MED ORDER — ALBUTEROL SULFATE (2.5 MG/3ML) 0.083% IN NEBU: 3.0000 mL | INHALATION_SOLUTION | RESPIRATORY_TRACT | Status: DC | PRN

## 2022-08-12 MED ORDER — FUROSEMIDE 10 MG/ML IJ SOLN
40.0000 mg | Freq: Once | INTRAMUSCULAR | Status: AC
  Administered 2022-08-12: 40 mg via INTRAVENOUS
  Filled 2022-08-12: qty 4

## 2022-08-12 NOTE — ED Triage Notes (Signed)
Pt BIB EMS from home. Per EMS, pt was in a facility for several weeks and was discharged home recently. Upon discharge, pt was dx with pneumonia. Pt has trach in place. Pt is on 8L of O2 with O2 saturation holding at 90%. Pt has productive cough. A/Ox4

## 2022-08-12 NOTE — ED Provider Notes (Signed)
[facility] EMERGENCY DEPARTMENT AT [facility] Provider Note   CSN: [id number] Arrival date & time: 08/12/22  1813     History  Chief Complaint  Patient presents with   Shortness of Breath    Gary [name] is a 67 y.o. male.  67 y.o. male with a past medical history of trach dependence degenerative disc disease, morbid obesity, hypertension, CHF, and polysubstance abuse who was recently in rehab and discharged last week to his home.  On the way from rehab to home he complained of chest pain and shortness of breath and was taken to [name] where he had a CTA of his chest which was negative for PE, did show concern for possible aspiration pneumonia but overall vital signs and labs are reassuring.  He did go home but presented to [facility] emergency room the next day.  Patient is continuing to have chest pain or shortness of breath.  Also there were barriers to care such as no home health staff at his house to set things up after his discharge from rehab.  He boarded with them overnight had no significant findings that required intervention and was eventually discharged home the next day.  Patient was ports that since being home he has had gradually worsening cough, congestion and shortness of breath.  At times he is felt that he had a fever.  He completed a course of Augmentin approximately 2 weeks ago and was given doxycycline on 22 April.  He reports that he is having intermittent productive sputum and just feels winded.  His trach collar is on 20 to 35% at all times.  He is also noticed more swelling in his lower extremities.  He complains of some chest tightness as well that comes and goes.  He has gotten out of bed and used his walker some which does make him more short of breath.  He reports being compliant with his medication.  The history is provided by the patient and medical records.  Shortness of Breath      Home Medications Prior to Admission medications    Medication Sig Start Date End Date Taking? Authorizing Provider  acetaminophen (TYLENOL) 325 MG tablet Take 2 tablets (650 mg total) by mouth every 6 (six) hours as needed for mild pain (or Fever >/= 101). 07/25/22   Loyce Dys, MD  amoxicillin-clavulanate (AUGMENTIN) 875-125 MG tablet Take 1 tablet by mouth 2 (two) times daily.    [provider]  apixaban (ELIQUIS) 2.5 MG TABS tablet Take 1 tablet (2.5 mg total) by mouth 2 (two) times daily. 12/13/21   Amin, Loura Halt, MD  bisacodyl (DULCOLAX) 10 MG suppository Place 1 suppository (10 mg total) rectally daily as needed for moderate constipation. 12/13/21   Amin, Loura Halt, MD  doxycycline (MONODOX) 100 MG capsule Take 100 mg by mouth 2 (two) times daily.    [provider]  escitalopram (LEXAPRO) 5 MG tablet Take 1 tablet (5 mg total) by mouth daily. 12/14/21   Amin, Loura Halt, MD  ferrous sulfate 325 (65 FE) MG tablet Take 1 tablet (325 mg total) by mouth daily with breakfast. Home med 04/06/22   Darlin Priestly, MD  hydrOXYzine (ATARAX) 25 MG tablet Take 1 tablet (25 mg total) by mouth 3 (three) times daily as needed for anxiety. 12/13/21   Amin, Ankit Chirag, MD  ipratropium-albuterol (DUONEB) 0.5-2.5 (3) MG/3ML SOLN Take 3 mLs by nebulization every 6 (six) hours as needed. 04/06/22   Darlin Priestly, MD  melatonin 5 MG TABS Take 1 tablet (5 mg total) by mouth at bedtime. 12/13/21   Amin, Loura Halt, MD  pantoprazole (PROTONIX) 40 MG tablet Take 1 tablet (40 mg total) by mouth 2 (two) times daily before a meal. 12/13/21   Amin, Loura Halt, MD  polyethylene glycol (MIRALAX / GLYCOLAX) 17 g packet Take 17 g by mouth daily as needed for moderate constipation. 12/13/21   Amin, Loura Halt, MD  torsemide (DEMADEX) 20 MG tablet Take 1 tablet (20 mg total) by mouth daily. 04/06/22 08/07/22  Darlin Priestly, MD  traZODone (DESYREL) 50 MG tablet Take 1 tablet (50 mg total) by mouth at bedtime as needed for sleep. 07/25/22   Loyce Dys, MD       Allergies    Patient has no known allergies.    Review of Systems   Review of Systems  Respiratory:  Positive for shortness of breath.     Physical Exam Updated Vital Signs BP 107/80   Pulse 95   Temp 98.3 F (36.8 C) (Oral)   Resp (!) 21   Ht 6\' 3"  (1.905 m)   Wt (!) 188.2 kg   SpO2 92%   BMI 51.87 kg/m  Physical Exam Vitals and nursing note reviewed.  Constitutional:      General: He is not in acute distress.    Appearance: He is well-developed. He is ill-appearing.  HENT:     Head: Normocephalic and atraumatic.  Eyes:     Conjunctiva/sclera: Conjunctivae normal.     Pupils: Pupils are equal, round, and reactive to light.  Neck:     Comments: Trach in place without drainage around the trach site Cardiovascular:     Rate and Rhythm: Normal rate and regular rhythm.     Heart sounds: No murmur heard. Pulmonary:     Effort: Pulmonary effort is normal. Tachypnea present. No respiratory distress.     Breath sounds: Decreased breath sounds present. No wheezing or rales.     Comments: Decreased breath sounds at the bases.  Occasional scant rales Abdominal:     General: There is no distension.     Palpations: Abdomen is soft.     Tenderness: There is no abdominal tenderness. There is no guarding or rebound.  Musculoskeletal:        General: No tenderness. Normal range of motion.     Cervical back: Normal range of motion and neck supple.     Right lower leg: Edema present.     Left lower leg: Edema present.  Skin:    General: Skin is warm and dry.     Findings: No erythema or rash.  Neurological:     Mental Status: He is alert and oriented to person, place, and time.  Psychiatric:        Behavior: Behavior normal.     ED Results / Procedures / Treatments   Labs (all labs ordered are listed, but only abnormal results are displayed) Labs Reviewed  CBC WITH DIFFERENTIAL/PLATELET - Abnormal; Notable for the following components:      Result Value   Hemoglobin  9.9 (*)    MCH 20.5 (*)    MCHC 24.9 (*)    RDW 18.0 (*)    nRBC 0.3 (*)    All other components within normal limits  COMPREHENSIVE METABOLIC PANEL - Abnormal; Notable for the following components:   Chloride 97 (*)    CO2 38 (*)    Creatinine, Ser 1.39 (*)  Calcium 8.0 (*)    Albumin 2.7 (*)    AST 13 (*)    GFR, Estimated 56 (*)    All other components within normal limits  I-STAT VENOUS BLOOD GAS, ED - Abnormal; Notable for the following components:   pCO2, Ven 75.8 (*)    Bicarbonate 45.0 (*)    TCO2 47 (*)    Acid-Base Excess 16.0 (*)    Calcium, Ion 1.03 (*)    HCT 38.0 (*)    Hemoglobin 12.9 (*)    All other components within normal limits  RESP PANEL BY RT-PCR (RSV, FLU A&B, COVID)  RVPGX2  BRAIN NATRIURETIC PEPTIDE  LACTIC ACID, PLASMA    EKG None  Radiology DG Chest 2 View  Result Date: 08/12/2022 CLINICAL DATA:  Shortness of breath EXAM: CHEST - 2 VIEW COMPARISON:  Chest x-ray 08/07/2022 FINDINGS: Tracheostomy is unchanged in position at the level of the clavicular heads. The heart is enlarged in the aorta is tortuous, unchanged. There central pulmonary vascular congestion and interstitial edema bilaterally. There are bands of atelectasis in the mid lungs bilaterally. There is no pleural effusion or pneumothorax. No acute fractures are seen. Is IMPRESSION: 1. Cardiomegaly with central pulmonary vascular congestion and interstitial edema. 2. Bands of atelectasis in the mid lungs bilaterally. Electronically Signed   By: Darliss Cheney M.D.   On: 08/12/2022 19:45    Procedures Procedures    Medications Ordered in ED Medications  albuterol (PROVENTIL) (2.5 MG/3ML) 0.083% nebulizer solution 3 mL (has no administration in time range)  furosemide (LASIX) injection 40 mg (has no administration in time range)    ED Course/ Medical Decision Making/ A&P                             Medical Decision Making Amount and/or Complexity of Data Reviewed Independent  Historian: EMS External Data Reviewed: notes. Labs: ordered. Decision-making details documented in ED Course. Radiology: ordered and independent interpretation performed. Decision-making details documented in ED Course. ECG/medicine tests: ordered and independent interpretation performed. Decision-making details documented in ED Course.  Risk Prescription drug management.   Pt with multiple medical problems and comorbidities and presenting today with a complaint that caries a high risk for morbidity and mortality.  Here today with worsening shortness of breath for the last week.  This is gotten persistently worse since leaving rehab and going home.  Patient reports home health is supposed to be coming out next week but he does work trach collar constantly and has had some productive sputum.  He had a CTA done earlier this week that showed no signs of PE but concern for possible aspiration.  He does take Eliquis regularly and low suspicion at this time for PE.  He has a history of COPD, CHF and pneumonia in the past.  Concern for possible CHF exacerbation versus pneumonia versus viral etiology as he does report worsening swelling in his legs.  Currently sats are 90% on his 20% trach collar.  He does take torsemide at home.  I have independently visualized and interpreted pt's images today.  Chest x-ray today which shows mildly worsening pulmonary edema.  10:29 PM I independently interpreted patient's EKG and labs.  EKG shows a sinus rhythm without acute changes.  COVID test and viral panel are negative, lactic acid is within normal limits, VBG shows a compensated respiratory acidosis, CBC with mild decrease in hemoglobin thought to be delusional with normal white count,  CMP unchanged with creatinine 1.39, patient's BMPs are always normal last EF was 55 to 60%.  Patient does take 20 mg torsemide daily.  Patient did require increased oxygen on Ventimask now at 50.  Feel that he is fluid overloaded today  causing his worsening shortness of breath.  Will diuresis with IV Lasix and admit for further care.  Findings discussed with the patient.  He is comfortable with this plan.  CRITICAL CARE Performed by: Kelsye Loomer Total critical care time: 30 minutes Critical care time was exclusive of separately billable procedures and treating other patients. Critical care was necessary to treat or prevent imminent or life-threatening deterioration. Critical care was time spent personally by me on the following activities: development of treatment plan with patient and/or surrogate as well as nursing, discussions with consultants, evaluation of patient's response to treatment, examination of patient, obtaining history from patient or surrogate, ordering and performing treatments and interventions, ordering and review of laboratory studies, ordering and review of radiographic studies, pulse oximetry and re-evaluation of patient's condition.         Final Clinical Impression(s) / ED Diagnoses Final diagnoses:  Hypoxia  Acute on chronic congestive heart failure, unspecified heart failure type [facility])    Rx / DC Orders ED Discharge Orders     None         Gwyneth Sprout, MD 08/12/22 2229
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
## 2022-08-13 ENCOUNTER — Observation Stay (HOSPITAL_COMMUNITY): Payer: Medicare Other

## 2022-08-13 ENCOUNTER — Other Ambulatory Visit: Payer: Self-pay

## 2022-08-13 DIAGNOSIS — Z1152 Encounter for screening for COVID-19: Secondary | ICD-10-CM | POA: Diagnosis not present

## 2022-08-13 DIAGNOSIS — E876 Hypokalemia: Secondary | ICD-10-CM | POA: Diagnosis present

## 2022-08-13 DIAGNOSIS — D631 Anemia in chronic kidney disease: Secondary | ICD-10-CM | POA: Diagnosis present

## 2022-08-13 DIAGNOSIS — J9620 Acute and chronic respiratory failure, unspecified whether with hypoxia or hypercapnia: Secondary | ICD-10-CM | POA: Diagnosis not present

## 2022-08-13 DIAGNOSIS — Z6841 Body Mass Index (BMI) 40.0 and over, adult: Secondary | ICD-10-CM | POA: Diagnosis not present

## 2022-08-13 DIAGNOSIS — Z87891 Personal history of nicotine dependence: Secondary | ICD-10-CM | POA: Diagnosis not present

## 2022-08-13 DIAGNOSIS — R079 Chest pain, unspecified: Secondary | ICD-10-CM

## 2022-08-13 DIAGNOSIS — I2781 Cor pulmonale (chronic): Secondary | ICD-10-CM | POA: Diagnosis present

## 2022-08-13 DIAGNOSIS — Z79899 Other long term (current) drug therapy: Secondary | ICD-10-CM | POA: Diagnosis not present

## 2022-08-13 DIAGNOSIS — Z930 Tracheostomy status: Secondary | ICD-10-CM | POA: Diagnosis not present

## 2022-08-13 DIAGNOSIS — J9621 Acute and chronic respiratory failure with hypoxia: Secondary | ICD-10-CM | POA: Diagnosis present

## 2022-08-13 DIAGNOSIS — Z515 Encounter for palliative care: Secondary | ICD-10-CM | POA: Diagnosis not present

## 2022-08-13 DIAGNOSIS — N182 Chronic kidney disease, stage 2 (mild): Secondary | ICD-10-CM | POA: Diagnosis not present

## 2022-08-13 DIAGNOSIS — I5033 Acute on chronic diastolic (congestive) heart failure: Secondary | ICD-10-CM | POA: Diagnosis present

## 2022-08-13 DIAGNOSIS — I5032 Chronic diastolic (congestive) heart failure: Secondary | ICD-10-CM

## 2022-08-13 DIAGNOSIS — I7143 Infrarenal abdominal aortic aneurysm, without rupture: Secondary | ICD-10-CM | POA: Diagnosis present

## 2022-08-13 DIAGNOSIS — G4733 Obstructive sleep apnea (adult) (pediatric): Secondary | ICD-10-CM | POA: Diagnosis present

## 2022-08-13 DIAGNOSIS — N1831 Chronic kidney disease, stage 3a: Secondary | ICD-10-CM | POA: Diagnosis not present

## 2022-08-13 DIAGNOSIS — Z833 Family history of diabetes mellitus: Secondary | ICD-10-CM | POA: Diagnosis not present

## 2022-08-13 DIAGNOSIS — I509 Heart failure, unspecified: Secondary | ICD-10-CM | POA: Diagnosis not present

## 2022-08-13 DIAGNOSIS — N1832 Chronic kidney disease, stage 3b: Secondary | ICD-10-CM | POA: Diagnosis present

## 2022-08-13 DIAGNOSIS — I428 Other cardiomyopathies: Secondary | ICD-10-CM | POA: Diagnosis present

## 2022-08-13 DIAGNOSIS — J449 Chronic obstructive pulmonary disease, unspecified: Secondary | ICD-10-CM | POA: Diagnosis present

## 2022-08-13 DIAGNOSIS — Z9911 Dependence on respirator [ventilator] status: Secondary | ICD-10-CM | POA: Diagnosis not present

## 2022-08-13 DIAGNOSIS — Z8616 Personal history of COVID-19: Secondary | ICD-10-CM | POA: Diagnosis not present

## 2022-08-13 DIAGNOSIS — Z7189 Other specified counseling: Secondary | ICD-10-CM | POA: Diagnosis not present

## 2022-08-13 DIAGNOSIS — E662 Morbid (severe) obesity with alveolar hypoventilation: Secondary | ICD-10-CM | POA: Diagnosis present

## 2022-08-13 DIAGNOSIS — R0902 Hypoxemia: Secondary | ICD-10-CM | POA: Diagnosis present

## 2022-08-13 DIAGNOSIS — I130 Hypertensive heart and chronic kidney disease with heart failure and stage 1 through stage 4 chronic kidney disease, or unspecified chronic kidney disease: Secondary | ICD-10-CM | POA: Diagnosis present

## 2022-08-13 DIAGNOSIS — F32A Depression, unspecified: Secondary | ICD-10-CM | POA: Diagnosis present

## 2022-08-13 DIAGNOSIS — E874 Mixed disorder of acid-base balance: Secondary | ICD-10-CM | POA: Diagnosis present

## 2022-08-13 DIAGNOSIS — J984 Other disorders of lung: Secondary | ICD-10-CM | POA: Diagnosis present

## 2022-08-13 LAB — BASIC METABOLIC PANEL
Anion gap: 8 (ref 5–15)
BUN: 14 mg/dL (ref 8–23)
CO2: 39 mmol/L — ABNORMAL HIGH (ref 22–32)
Calcium: 7.8 mg/dL — ABNORMAL LOW (ref 8.9–10.3)
Chloride: 97 mmol/L — ABNORMAL LOW (ref 98–111)
Creatinine, Ser: 1.35 mg/dL — ABNORMAL HIGH (ref 0.61–1.24)
GFR, Estimated: 58 mL/min — ABNORMAL LOW (ref >60)
Glucose, Bld: 105 mg/dL — ABNORMAL HIGH (ref 70–99)
Potassium: 3.1 mmol/L — ABNORMAL LOW (ref 3.5–5.1)
Sodium: 144 mmol/L (ref 135–145)

## 2022-08-13 LAB — TROPONIN I (HIGH SENSITIVITY)
Troponin I (High Sensitivity): 7 ng/L (ref <18)
Troponin I (High Sensitivity): 8 ng/L (ref <18)

## 2022-08-13 LAB — ECHOCARDIOGRAM COMPLETE
AR max vel: 3.82 cm2
AV Area VTI: 4.42 cm2
AV Area mean vel: 3.69 cm2
AV Mean grad: 7 mmHg
AV Peak grad: 13.2 mmHg
Ao pk vel: 1.82 m/s
Area-P 1/2: 4.16 cm2
Height: 75 in
S' Lateral: 3 cm
Weight: 6640 oz

## 2022-08-13 LAB — COOXEMETRY PANEL
Carboxyhemoglobin: 1.7 % — ABNORMAL HIGH (ref 0.5–1.5)
Methemoglobin: <0.7 % (ref 0.0–1.5)
O2 Saturation: 83.8 %
Total hemoglobin: 10.1 g/dL — ABNORMAL LOW (ref 12.0–16.0)

## 2022-08-13 LAB — PROCALCITONIN: Procalcitonin: <0.1 ng/mL

## 2022-08-13 LAB — MRSA NEXT GEN BY PCR, NASAL: MRSA by PCR Next Gen: NOT DETECTED

## 2022-08-13 LAB — MAGNESIUM: Magnesium: 1.8 mg/dL (ref 1.7–2.4)

## 2022-08-13 MED ORDER — FUROSEMIDE 10 MG/ML IJ SOLN
40.0000 mg | Freq: Two times a day (BID) | INTRAMUSCULAR | Status: DC
  Administered 2022-08-13: 40 mg via INTRAVENOUS
  Filled 2022-08-13: qty 4

## 2022-08-13 MED ORDER — FUROSEMIDE 10 MG/ML IJ SOLN
60.0000 mg | Freq: Two times a day (BID) | INTRAMUSCULAR | Status: DC
  Administered 2022-08-13 – 2022-08-14 (×3): 60 mg via INTRAVENOUS
  Filled 2022-08-13 (×4): qty 6

## 2022-08-13 MED ORDER — SODIUM CHLORIDE 0.9% FLUSH
10.0000 mL | Freq: Two times a day (BID) | INTRAVENOUS | Status: DC
  Administered 2022-08-14: 20 mL
  Administered 2022-08-14 – 2022-08-21 (×11): 10 mL

## 2022-08-13 MED ORDER — ACETAMINOPHEN 650 MG RE SUPP: 650.0000 mg | Freq: Four times a day (QID) | RECTAL | Status: DC | PRN

## 2022-08-13 MED ORDER — CHLORHEXIDINE GLUCONATE CLOTH 2 % EX PADS
6.0000 | MEDICATED_PAD | Freq: Every day | CUTANEOUS | Status: DC
  Administered 2022-08-13 – 2022-08-21 (×9): 6 via TOPICAL

## 2022-08-13 MED ORDER — IPRATROPIUM-ALBUTEROL 0.5-2.5 (3) MG/3ML IN SOLN: 3.0000 mL | Freq: Four times a day (QID) | RESPIRATORY_TRACT | Status: DC | PRN

## 2022-08-13 MED ORDER — POTASSIUM CHLORIDE CRYS ER 20 MEQ PO TBCR
60.0000 meq | EXTENDED_RELEASE_TABLET | Freq: Once | ORAL | Status: AC
  Administered 2022-08-13: 60 meq via ORAL
  Filled 2022-08-13: qty 3

## 2022-08-13 MED ORDER — SODIUM CHLORIDE 0.9% FLUSH: 10.0000 mL | INTRAVENOUS | Status: DC | PRN

## 2022-08-13 MED ORDER — ACETAMINOPHEN 325 MG PO TABS: 650.0000 mg | ORAL_TABLET | Freq: Four times a day (QID) | ORAL | Status: DC | PRN

## 2022-08-13 MED ORDER — POTASSIUM CHLORIDE CRYS ER 20 MEQ PO TBCR
40.0000 meq | EXTENDED_RELEASE_TABLET | Freq: Every day | ORAL | Status: DC
  Administered 2022-08-13 – 2022-08-20 (×8): 40 meq via ORAL
  Filled 2022-08-13 (×8): qty 2

## 2022-08-13 MED ORDER — MAGNESIUM SULFATE 2 GM/50ML IV SOLN
2.0000 g | Freq: Once | INTRAVENOUS | Status: AC
  Administered 2022-08-13: 2 g via INTRAVENOUS
  Filled 2022-08-13: qty 50

## 2022-08-13 NOTE — Progress Notes (Signed)
Amend earlier note: apparently uses vent at night. Continue.

## 2022-08-13 NOTE — Progress Notes (Signed)
PROGRESS NOTE   Gary [name]  RUE:[id number] DOB: 1955/08/04 DOA: 08/12/2022 PCP: [facility]  Brief Narrative:   67 year old black male HFpEF EF 55-60% Chronic respiratory failure + tracheotomy (Shiley XLT size 7 uncuffed) since 10/03/2021 --lengthy hospitalization 09/19/2021-12/13/2021 admission@[facility]   Acute respiratory failure, Vap, AKI with hyperkalemia, melena with active GI bleed proximal sigmoid colon + embolization by IR, multiple transfusions etc. etc.-see discharge summary dated 12/13/2021 uses nocturnal vent currently COPD--OSA-now on nocturnal vent Prior GI bleed requiring IR intervention as above HTN + BMI >50+ CKD 3B-baseline dry weight 350 pounds  Recent Rx antibiotics 2 rounds  Admitted 1/11 through 07/26/2022 bronchitis COPD-evaluated [facility] 08/08/2022 CTA chest no PE, R patchy pulmonary opacities?  PNA  -presented to [facility] ED stayed there overnight Developed dyspnea, chest pain, and noticed more lower extremity swelling-at rehab states food was "too salty" and was taking his torsemide  In ED requiring 10 L supplemental oxygen 50% FiO2 via trach collar No leukocytosis hemoglobin baseline VBG 7.3 pCO2 75 CXR?  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 labs Last echo 04/12/2022 grade 1 diastolic dysfunction poor exam given his habitus-could not assess for pulmonary hypertension Discontinue male external urinal Bariatric bedside commode Standing weights It appears he has a  trilogy vent at home which he should continue to use on discharge I will ask if advanced heart failure team wants to see him I do not see where he has had a cath before to determine filling pressures although this can be temporized until another time  Status post trach vent dependence at night Can use vent at night at home settings, PMV in place, patient verbalizing fairly well and no secretions Transferring to 2 heart for management of their  Chest pain No concerns for angina, likely secondary to CHF as above  Hypokalemia Replace orally with K. Dur 40 and recheck with magnesium in a.m.  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  CKD 3 AA Monitor kidney function, check in a.m. especially as is on diuretics  Chronic anemia Monitor amount resume FeSO4 daily  Impaired glucose tolerance He is not a diabetic blood sugars so far below 120  Depression resume Lexapro 5, hydroxyzine cut back from 25 PTA dosing-->10 3 times daily  He has listed Eliquis on his MAR but I have discontinued it as there is no clear indication based on chart review  DVT prophylaxis: Prophylactic heparin Code Status: Full code Family Communication:  Disposition:  Status is: Observation The patient remains OBS appropriate and will d/c before 2 midnights.     Subjective: Awake resting in bed-readily arousable No distress-very poor eye contact No pain no fever states he feels swollen in lower extremities/abdomen  Objective: Vitals:   08/12/22 2230 08/12/22 2300 08/13/22 0300 08/13/22 0355  BP: 115/66 110/67 111/77   Pulse: 88 89 82   Resp: (!) 24 (!) 33 20   Temp:   98.2 F (36.8 C)  TempSrc:   Oral   SpO2: 95% 91% 95% 95%  Weight:      Height:        Intake/Output Summary (Last 24 hours) at 08/13/2022  0710 Last data filed at 08/13/2022 0011 Gross per 24 hour  Intake --  Output 275 ml  Net -275 ml   Filed Weights   08/12/22 1826  Weight: (!) 188.2 kg    Examination:  EOMI with some blebs to the right eyelid Very thick neck trach noted Passy-Muir in place No secretions noted S1-S2 no murmur difficult exam Abdomen is morbidly obese-cannot assess for organomegaly given exam Grossly swollen but not overtly edematous Power is 5/5 he is coherent  Data Reviewed: personally reviewed   CBC    Component Value Date/Time   WBC 7.6 08/12/2022 2030   RBC 4.83 08/12/2022 2030   HGB 12.9 (L) 08/12/2022 2047   HGB 13.5 04/15/2014 1240   HCT 38.0 (L) 08/12/2022 2047   HCT 44.6 04/15/2014 1240   PLT 253 08/12/2022 2030   PLT 163 04/15/2014 1240   MCV 82.4 08/12/2022 2030   MCV 75 (L) 04/15/2014 1240   MCH 20.5 (L) 08/12/2022 2030   MCHC 24.9 (L) 08/12/2022 2030   RDW 18.0 (H) 08/12/2022 2030   RDW 15.8 (H) 04/15/2014 1240   LYMPHSABS 1.5 08/12/2022 2030   MONOABS 0.8 08/12/2022 2030   EOSABS 0.2 08/12/2022 2030   BASOSABS 0.0 08/12/2022 2030      Latest Ref Rng & Units 08/13/2022    2:42 AM 08/12/2022    8:47 PM 08/12/2022    8:30 PM  CMP  Glucose 70 - 99 mg/dL 811   79   BUN 8 - 23 mg/dL 14   15   Creatinine 9.14 - 1.24 mg/dL 7.82   9.56   Sodium 213 - 145 mmol/L 144  145  144   Potassium 3.5 - 5.1 mmol/L 3.1  3.5  3.5   Chloride 98 - 111 mmol/L 97   97   CO2 22 - 32 mmol/L 39   38   Calcium 8.9 - 10.3 mg/dL 7.8   8.0   Total Protein 6.5 - 8.1 g/dL   7.2   Total Bilirubin 0.3 - 1.2 mg/dL   0.3   Alkaline Phos 38 - 126 U/L   97   AST 15 - 41 U/L   13   ALT 0 - 44 U/L   12      Radiology Studies: DG Chest 2 View  Result Date: 08/12/2022 CLINICAL DATA:  Shortness of breath EXAM: CHEST - 2 VIEW COMPARISON:  Chest x-ray 08/07/2022 FINDINGS: Tracheostomy is unchanged in position at the level of the clavicular heads. The heart is enlarged in the aorta is tortuous,  unchanged. There central pulmonary vascular congestion and interstitial edema bilaterally. There are bands of atelectasis in the mid lungs bilaterally. There is no pleural effusion or pneumothorax. No acute fractures are seen. Is IMPRESSION: 1. Cardiomegaly with central pulmonary vascular congestion and interstitial edema. 2. Bands of atelectasis in the mid lungs bilaterally. Electronically Signed   By: Darliss Cheney M.D.   On: 08/12/2022 19:45     Scheduled Meds:  furosemide  40 mg Intravenous BID   Continuous Infusions:   LOS: 0 days   Time spent: 75 minutes  Rhetta Mura, MD [facility] To contact the attending provider between 7A-7P or the covering provider during after hours 7P-7A, please log into the web site [email or web address] and access using  universal [place] password for that web site. If you do not have the password, please call the hospital operator.  08/13/2022, 7:10 AM

## 2022-08-13 NOTE — Progress Notes (Signed)
  Echocardiogram 2D Echocardiogram has been performed.  Gary [name] 08/13/2022, 5:35 PM
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
## 2022-08-13 NOTE — Progress Notes (Signed)
RT attempted at changing pt's trach from #7 XLT proximal cuffless to #7 XLT proximal CUFFED for nocturnal vent. Pt refused having a cuffed trach at this time. CCM MD made aware.
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
## 2022-08-13 NOTE — Progress Notes (Signed)
Peripherally Inserted Central Catheter Placement  The IV Nurse has discussed with the patient and/or persons authorized to consent for the patient, the purpose of this procedure and the potential benefits and risks involved with this procedure.  The benefits include less needle sticks, lab draws from the catheter, and the patient may be discharged home with the catheter. Risks include, but not limited to, infection, bleeding, blood clot (thrombus formation), and puncture of an artery; nerve damage and irregular heartbeat and possibility to perform a PICC exchange if needed/ordered by physician.  Alternatives to this procedure were also discussed.  Bard Power PICC patient education guide, fact sheet on infection prevention and patient information card has been provided to patient /or left at bedside.    PICC Placement Documentation  PICC Double Lumen 08/13/22 Right Brachial 47 cm 0 cm (Active)  Indication for Insertion or Continuance of Line Vasoactive infusions 08/13/22 1513  Exposed Catheter (cm) 0 cm 08/13/22 1513  Site Assessment Clean, Dry, Intact 08/13/22 1513  Lumen #1 Status Flushed;Saline locked;Blood return noted 08/13/22 1513  Lumen #2 Status Flushed;Saline locked;Blood return noted 08/13/22 1513  Dressing Type Transparent;Securing device 08/13/22 1513  Dressing Status Antimicrobial disc in place;Clean, Dry, Intact 08/13/22 1513  Safety Lock Not Applicable 08/13/22 1513  Line Care Connections checked and tightened 08/13/22 1513  Line Adjustment (NICU/IV Team Only) No 08/13/22 1513  Dressing Intervention New dressing;Other (Comment) 08/13/22 1513  Dressing Change Due 08/20/22 08/13/22 1513       Annett Fabian 08/13/2022, 3:14 PM
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
## 2022-08-13 NOTE — Progress Notes (Signed)
RT assisted with transport of this pt from 2C5 to 2H02 on trach collar. RT gave report to 2H RT. Pt tolerated well with SVS and no complications.

## 2022-08-14 DIAGNOSIS — J9620 Acute and chronic respiratory failure, unspecified whether with hypoxia or hypercapnia: Secondary | ICD-10-CM | POA: Diagnosis not present

## 2022-08-14 DIAGNOSIS — N182 Chronic kidney disease, stage 2 (mild): Secondary | ICD-10-CM | POA: Diagnosis not present

## 2022-08-14 DIAGNOSIS — I5032 Chronic diastolic (congestive) heart failure: Secondary | ICD-10-CM | POA: Diagnosis not present

## 2022-08-14 DIAGNOSIS — I5033 Acute on chronic diastolic (congestive) heart failure: Secondary | ICD-10-CM | POA: Diagnosis not present

## 2022-08-14 LAB — CBC
HCT: 41.2 % (ref 39.0–52.0)
Hemoglobin: 10.2 g/dL — ABNORMAL LOW (ref 13.0–17.0)
MCH: 21 pg — ABNORMAL LOW (ref 26.0–34.0)
MCHC: 24.8 g/dL — ABNORMAL LOW (ref 30.0–36.0)
MCV: 84.8 fL (ref 80.0–100.0)
Platelets: 227 10*3/uL (ref 150–400)
RBC: 4.86 MIL/uL (ref 4.22–5.81)
RDW: 18.1 % — ABNORMAL HIGH (ref 11.5–15.5)
WBC: 8.9 10*3/uL (ref 4.0–10.5)
nRBC: 0 % (ref 0.0–0.2)

## 2022-08-14 LAB — RENAL FUNCTION PANEL
Albumin: 2.7 g/dL — ABNORMAL LOW (ref 3.5–5.0)
Anion gap: 5 (ref 5–15)
BUN: 11 mg/dL (ref 8–23)
CO2: 39 mmol/L — ABNORMAL HIGH (ref 22–32)
Calcium: 7.9 mg/dL — ABNORMAL LOW (ref 8.9–10.3)
Chloride: 100 mmol/L (ref 98–111)
Creatinine, Ser: 1.2 mg/dL (ref 0.61–1.24)
GFR, Estimated: >60 mL/min (ref >60)
Glucose, Bld: 103 mg/dL — ABNORMAL HIGH (ref 70–99)
Phosphorus: 4.1 mg/dL (ref 2.5–4.6)
Potassium: 4 mmol/L (ref 3.5–5.1)
Sodium: 144 mmol/L (ref 135–145)

## 2022-08-14 LAB — MAGNESIUM: Magnesium: 2.3 mg/dL (ref 1.7–2.4)

## 2022-08-14 MED ORDER — ENOXAPARIN SODIUM 100 MG/ML IJ SOSY
90.0000 mg | PREFILLED_SYRINGE | INTRAMUSCULAR | Status: DC
  Administered 2022-08-14: 90 mg via SUBCUTANEOUS
  Filled 2022-08-14: qty 0.9

## 2022-08-14 MED ORDER — PANTOPRAZOLE SODIUM 40 MG PO TBEC
40.0000 mg | DELAYED_RELEASE_TABLET | Freq: Two times a day (BID) | ORAL | Status: DC
  Administered 2022-08-15 – 2022-08-21 (×13): 40 mg via ORAL
  Filled 2022-08-14 (×13): qty 1

## 2022-08-14 MED ORDER — ESCITALOPRAM OXALATE 10 MG PO TABS
5.0000 mg | ORAL_TABLET | Freq: Every day | ORAL | Status: DC
  Administered 2022-08-15 – 2022-08-21 (×7): 5 mg via ORAL
  Filled 2022-08-14 (×7): qty 1

## 2022-08-14 MED ORDER — POLYETHYLENE GLYCOL 3350 17 G PO PACK: 17.0000 g | PACK | Freq: Every day | ORAL | Status: DC | PRN

## 2022-08-14 MED ORDER — POTASSIUM CHLORIDE CRYS ER 20 MEQ PO TBCR: 40.0000 meq | EXTENDED_RELEASE_TABLET | Freq: Every day | ORAL | Status: DC

## 2022-08-14 MED ORDER — POTASSIUM CHLORIDE CRYS ER 20 MEQ PO TBCR
20.0000 meq | EXTENDED_RELEASE_TABLET | Freq: Once | ORAL | Status: AC
  Administered 2022-08-14: 20 meq via ORAL

## 2022-08-14 MED ORDER — APIXABAN 2.5 MG PO TABS
2.5000 mg | ORAL_TABLET | Freq: Two times a day (BID) | ORAL | Status: DC
  Administered 2022-08-15 – 2022-08-21 (×13): 2.5 mg via ORAL
  Filled 2022-08-14 (×13): qty 1

## 2022-08-14 MED ORDER — APIXABAN 2.5 MG PO TABS: 2.5000 mg | ORAL_TABLET | Freq: Two times a day (BID) | ORAL | Status: DC

## 2022-08-14 MED ORDER — ACETAZOLAMIDE ER 500 MG PO CP12
500.0000 mg | ORAL_CAPSULE | Freq: Two times a day (BID) | ORAL | Status: DC
  Filled 2022-08-14 (×2): qty 1

## 2022-08-14 MED ORDER — FERROUS SULFATE 325 (65 FE) MG PO TABS
325.0000 mg | ORAL_TABLET | Freq: Every day | ORAL | Status: DC
  Administered 2022-08-15 – 2022-08-21 (×7): 325 mg via ORAL
  Filled 2022-08-14 (×7): qty 1

## 2022-08-14 MED ORDER — POTASSIUM CHLORIDE CRYS ER 20 MEQ PO TBCR
60.0000 meq | EXTENDED_RELEASE_TABLET | Freq: Once | ORAL | Status: DC
  Filled 2022-08-14: qty 3

## 2022-08-14 MED ORDER — IPRATROPIUM-ALBUTEROL 0.5-2.5 (3) MG/3ML IN SOLN: 3.0000 mL | Freq: Four times a day (QID) | RESPIRATORY_TRACT | Status: DC | PRN

## 2022-08-14 MED ORDER — ACETAZOLAMIDE 250 MG PO TABS
500.0000 mg | ORAL_TABLET | Freq: Two times a day (BID) | ORAL | Status: AC
  Administered 2022-08-14 (×2): 500 mg via ORAL
  Filled 2022-08-14 (×2): qty 2

## 2022-08-14 NOTE — Progress Notes (Addendum)
PROGRESS NOTE   Gary [name]  XBM:[id number] DOB: [birth year] DOA: 08/12/2022 PCP: [facility]  Brief Narrative:   67 year old black male HFpEF EF 55-60% Chronic respiratory failure + tracheotomy (Shiley XLT size 7 uncuffed) since 10/03/2021 --lengthy hospitalization 09/19/2021-12/13/2021 admission@[facility]   Acute respiratory failure, Vap, AKI with hyperkalemia, melena with active GI bleed proximal sigmoid colon + embolization by IR, multiple transfusions etc. etc.-see discharge summary dated 12/13/2021 uses nocturnal vent currently COPD--OSA-now on nocturnal vent Prior GI bleed requiring IR intervention as above HTN + BMI >50+ CKD 3B-baseline dry weight 350 pounds  Recent Rx antibiotics 2 rounds  Admitted 1/11 through 07/26/2022 bronchitis COPD-evaluated [facility] 08/08/2022 CTA chest no PE, R patchy pulmonary opacities?  PNA  -presented to [facility] ED stayed there overnight Developed dyspnea, chest pain, and noticed more lower extremity swelling-at rehab states food was "too salty" and was taking his torsemide  In ED requiring 10 L supplemental oxygen 50% FiO2 via trach collar No leukocytosis hemoglobin baseline VBG 7.3 pCO2 75 CXR?  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-no pneumonia--Procalcitonin negative, low concern for infection Torsemide held-currently Lasix to 60 IV twice daily Current echo as above Standing weights-188-->181, -3 liters so far It appears he has a trilogy vent at home--he will  ask family to bring form home HF team signed off--we will ask palliative care to talk with him later this stay  Status post trach vent dependence at night Refused hs VENT--reinforced with Pulmonary /bedside RN PMV in place, patient verbalizing fairly well and no secretions NEEDS TO BE ICU status--DO NOT TRANSFER  Chest pain No concerns for angina, likely secondary to CHF as above  Hypokalemia Continue replacement kdur 40 qd  COPD OSA vent dependent at night-quit smoking several years ago Vent adjustments as per CCM  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 Outpatient characterization follow-up as per PCP/pulmonology who follow him @ [place]  CKD 3 AA Met aLKALOSIS Monitor kidney function, as well as Magnesium Added Diamox 500 BID  Chronic anemia Monitor amount resume FeSO4 daily  Impaired glucose tolerance He is not a diabetic blood sugars so far below 120  Depression  resume Lexapro 5, hydroxyzine 25 tid  On chronic DVT prophylactic at home given habitus Eliquis resumed  DVT prophylaxis: eliquis 2.5 bid Code Status: Full code Family Communication: none Disposition:  Status is: Observation The patient remains OBS appropriate and will d/c before 2 midnights.     Subjective:  Declined hs vent--cites discomfort Doesn't want a cuffed trach-getting his home vent ldelivered tomorrow? Agrees to use tonight No cp fever Seen this am and then Seen sitting OOB this pm  Objective: Vitals:   08/14/22 1300 08/14/22 1400 08/14/22 1500 08/14/22 1522  BP: 116/73 113/61 120/64   Pulse: 83 82 81 83  Resp: 17 (!) 30 (!) 26 (!) 27  Temp:      TempSrc:      SpO2: 94% 96% 97% 92%  Weight:      Height:        Intake/Output Summary (Last 24 hours) at 08/14/2022 1751 Last data filed at 08/14/2022  1630 Gross per 24 hour  Intake 50 ml  Output 2375 ml  Net -2325 ml    Filed Weights   08/12/22 1826 08/14/22 0500  Weight: (!) 188.2 kg (!) 181.7 kg     Examination:  EOMI with some blebs to the right eyelid Very thick neck trach noted Passy-Muir in place Scant secretions S1-S2 no murmur difficult exam Abdomen is morbidly obese-cannot assess organomegaly he is coherent  Data Reviewed: personally reviewed   CBC    Component Value Date/Time   WBC 8.9 08/14/2022 0604   RBC 4.86 08/14/2022 0604   HGB 10.2 (L) 08/14/2022 0604   HGB 13.5 04/15/2014 1240   HCT 41.2 08/14/2022 0604   HCT 44.6 04/15/2014 1240   PLT 227 08/14/2022 0604   PLT 163 04/15/2014 1240   MCV 84.8 08/14/2022 0604   MCV 75 (L) 04/15/2014 1240   MCH 21.0 (L) 08/14/2022 0604   MCHC 24.8 (L) 08/14/2022 0604   RDW 18.1 (H) 08/14/2022 0604   RDW 15.8 (H) 04/15/2014 1240   LYMPHSABS 1.5 08/12/2022 2030   MONOABS 0.8 08/12/2022 2030   EOSABS 0.2 08/12/2022 2030   BASOSABS 0.0 08/12/2022 2030      Latest Ref Rng & Units 08/14/2022    6:04 AM 08/13/2022    2:42 AM 08/12/2022    8:47 PM  CMP  Glucose 70 - 99 mg/dL 161  096    BUN 8 - 23 mg/dL 11  14    Creatinine 0.45 - 1.24 mg/dL 4.09  8.11    Sodium 914 - 145 mmol/L 144  144  145   Potassium 3.5 - 5.1 mmol/L 4.0  3.1  3.5   Chloride 98 - 111 mmol/L 100  97    CO2 22 - 32 mmol/L 39  39    Calcium 8.9 - 10.3 mg/dL 7.9  7.8       Radiology Studies: ECHOCARDIOGRAM COMPLETE  Result Date: 08/13/2022    ECHOCARDIOGRAM REPORT   Patient Name:   Gary [name] Date of Exam: 08/13/2022 Medical Rec #:  [id number]      Height:       75.0 in Accession #:    [id number]     Weight:       415.0 lb Date of Birth:  06/17/1955      BSA:          2.993 m Patient Age:    67 years       BP:           126/80 mmHg Patient Gender: M              HR:           88 bpm. Exam Location:  Inpatient Procedure: 2D Echo, Cardiac Doppler and Color Doppler Indications:    CHF I50.9  History:        Patient has prior history of Echocardiogram examinations, most                 recent 04/12/2022. CHF; Risk Factors:Dyslipidemia, Hypertension                  and Former Smoker.  Sonographer:    Dondra Prader RVT RCS Referring Phys: [phone] AMY D CLEGG  Sonographer Comments: No subcostal window, patient is obese, Technically challenging study due to limited acoustic windows, Technically difficult study due to poor echo windows, suboptimal parasternal window and suboptimal apical window. Image acquisition challenging due to patient body habitus and  Unable to accurately measure atrium due to very poor and limited windows. Declined definity IMPRESSIONS  1. Left ventricular ejection fraction, by estimation, is 65 to 70%. The left ventricle has normal function. The left ventricle has no regional wall motion abnormalities. There is mild left ventricular hypertrophy. Left ventricular diastolic parameters were normal.  2. Right ventricular systolic function is normal. The right ventricular size is normal.  3. The mitral valve is normal in structure. No evidence of mitral valve regurgitation. No evidence of mitral stenosis.  4. The aortic valve has an indeterminant number of cusps. Aortic valve regurgitation is not visualized. No aortic stenosis is present. FINDINGS  Left Ventricle: Left ventricular ejection fraction, by estimation, is 65 to 70%. The left ventricle has normal function. The left ventricle has no regional wall motion abnormalities. The left ventricular internal cavity size was normal in size. There is  mild left ventricular hypertrophy. Left ventricular diastolic parameters were normal. Right Ventricle: The right ventricular size is normal. Right ventricular systolic function is normal. Left Atrium: Left atrial size was normal in size. Right Atrium: Right atrial size was normal in size. Pericardium: There is no evidence of pericardial effusion. Mitral Valve: The mitral valve is normal in structure. No evidence of mitral valve regurgitation. No evidence of mitral valve stenosis. Tricuspid Valve: The tricuspid valve is normal in structure. Tricuspid valve  regurgitation is not demonstrated. No evidence of tricuspid stenosis. Aortic Valve: The aortic valve has an indeterminant number of cusps. Aortic valve regurgitation is not visualized. No aortic stenosis is present. Aortic valve mean gradient measures 7.0 mmHg. Aortic valve peak gradient measures 13.2 mmHg. Aortic valve area, by VTI measures 4.42 cm. Pulmonic Valve: The pulmonic valve was not well visualized. Pulmonic valve regurgitation is not visualized. No evidence of pulmonic stenosis. Aorta: The aortic root is normal in size and structure. Venous: The inferior vena cava was not well visualized. IAS/Shunts: The interatrial septum was not well visualized.  LEFT VENTRICLE PLAX 2D LVIDd:         5.60 cm   Diastology LVIDs:         3.00 cm   LV e' medial:    8.55 cm/s LV PW:         1.20 cm   LV E/e' medial:  9.3 LV IVS:        1.50 cm   LV e' lateral:   17.60 cm/s LVOT diam:     2.60 cm   LV E/e' lateral: 4.5 LV SV:         121 LV SV Index:   40 LVOT Area:     5.31 cm  RIGHT VENTRICLE RV S prime:     21.30 cm/s TAPSE (M-mode): 3.1 cm LEFT ATRIUM         Index LA diam:    3.80 cm 1.27 cm/m  AORTIC VALVE                     PULMONIC VALVE AV Area (Vmax):    3.82 cm      PV Vmax:       0.62 m/s AV Area (Vmean):   3.69 cm      PV Peak grad:  1.5 mmHg AV Area (VTI):     4.42 cm AV Vmax:           182.00 cm/s AV Vmean:          122.000 cm/s AV VTI:  0.274 m AV Peak Grad:      13.2 mmHg AV Mean Grad:      7.0 mmHg LVOT Vmax:         131.00 cm/s LVOT Vmean:        84.700 cm/s LVOT VTI:          0.228 m LVOT/AV VTI ratio: 0.83  AORTA Ao Root diam: 3.40 cm Ao Asc diam:  3.30 cm MITRAL VALVE MV Area (PHT): 4.16 cm    SHUNTS MV Decel Time: 183 msec    Systemic VTI:  0.23 m MV E velocity: 79.50 cm/s  Systemic Diam: 2.60 cm MV A velocity: 88.65 cm/s MV E/A ratio:  0.90 Olga Millers MD Electronically signed by Olga Millers MD Signature Date/Time: 08/13/2022/5:39:41 PM    Final    [place] EKG SITE RITE  Result  Date: 08/13/2022 If Site Rite image not attached, placement could not be confirmed due to current cardiac rhythm.  DG Chest 2 View  Result Date: 08/12/2022 CLINICAL DATA:  Shortness of breath EXAM: CHEST - 2 VIEW COMPARISON:  Chest x-ray 08/07/2022 FINDINGS: Tracheostomy is unchanged in position at the level of the clavicular heads. The heart is enlarged in the aorta is tortuous, unchanged. There central pulmonary vascular congestion and interstitial edema bilaterally. There are bands of atelectasis in the mid lungs bilaterally. There is no pleural effusion or pneumothorax. No acute fractures are seen. Is IMPRESSION: 1. Cardiomegaly with central pulmonary vascular congestion and interstitial edema. 2. Bands of atelectasis in the mid lungs bilaterally. Electronically Signed   By: Darliss Cheney M.D.   On: 08/12/2022 19:45     Scheduled Meds:  acetaZOLAMIDE  500 mg Oral BID   Chlorhexidine Gluconate Cloth  6 each Topical Daily   enoxaparin (LOVENOX) injection  90 mg Subcutaneous Q24H   furosemide  60 mg Intravenous BID   potassium chloride  40 mEq Oral Daily   sodium chloride flush  10-40 mL Intracatheter Q12H   Continuous Infusions:   LOS: 1 day   Time spent: 45 minutes  Rhetta Mura, MD [facility] To contact the attending provider between 7A-7P or the covering provider during after hours 7P-7A, please log into the web site [email or web address] and access using universal [place] password for that web site. If you do not have the password, please call the hospital operator.  08/14/2022, 5:51 PM

## 2022-08-14 NOTE — Progress Notes (Signed)
PT Cancellation Note  Patient Details Name: Gary [name] MRN: [id number] DOB: 01-13-1956   Cancelled Treatment:    Reason Eval/Treat Not Completed: Patient declined, no reason specified ( Pt able to assist with pulling up in bed and pt refusing mobility after pt's demands of adjusting in bed for 20 min had been met, pt refused further mobility stating need to eat his lunch first. Pt does report desire for SNF due to difficulty at home)   [name] Elorah Dewing 08/14/2022, 11:54 AM Merryl Hacker, PT Acute Rehabilitation Services Office: [phone]

## 2022-08-14 NOTE — Progress Notes (Signed)
MD previously called to bedside to speak with patient regarding care and concerns  related to refusing care.  Security called to bedside at approx.1815 by staff as pt extremely belligerent and attempting to get out of bariatric chair without staff assistance.  Pt also refusing care and vent support.  Reinforced importance of providing safe and appropriate care as well as appropriate verbal tone for communicating needs. VSS with no changes in patient care status.

## 2022-08-14 NOTE — Progress Notes (Signed)
Placed patient on the vent with cuff less trach.

## 2022-08-14 NOTE — Progress Notes (Signed)
Patient having exhaled Vt of <200 and Ve <1.0 while on the vent due to cuff less trach. Patient in no respiratory distress while on the vent. Placed patient back on 40% trach collar due to large leak from cuff less trach.

## 2022-08-14 NOTE — Progress Notes (Addendum)
Pt asked RT about going on ventilator for the night, pt still refusing to have cuffed trach. RT explained to pt the difficulty using the ventilator with cuffless trachs, pt disagreed. RT notified [facility] of pt oxygen desaturation on trach collar to 83% while sleeping, pt in no distress at this time. RT awaiting call from MD.

## 2022-08-14 NOTE — Progress Notes (Addendum)
Advanced Heart Failure Rounding Note  PCP-Cardiologist: Julien Nordmann, MD   Subjective:    1.7L in UOP yesterday.   SCr trending down, 1.35>>1.20  CO2 elevated at 39. Pt refused to have cuffed trach overnight. Desaturation on trach collar to 83% while sleeping overnight. Now back on trach.   CVP 13. Currently sleeping.     Objective:   Weight Range: (!) 181.7 kg Body mass index is 50.07 kg/m.   Vital Signs:   Temp:  [97.8 F (36.6 C)-98.4 F (36.9 C)] 97.9 F (36.6 C) (04/29 2300) Pulse Rate:  [80-94] 86 (04/30 0800) Resp:  [11-42] 21 (04/30 0717) BP: (91-126)/(61-81) 122/61 (04/30 0800) SpO2:  [83 %-100 %] 98 % (04/30 0800) FiO2 (%):  [40 %-50 %] 50 % (04/30 0800) Weight:  [181.7 kg] 181.7 kg (04/30 0500) Last BM Date : 08/13/22  Weight change: Filed Weights   08/12/22 1826 08/14/22 0500  Weight: (!) 188.2 kg (!) 181.7 kg    Intake/Output:   Intake/Output Summary (Last 24 hours) at 08/14/2022 0905 Last data filed at 08/14/2022 0800 Gross per 24 hour  Intake 50 ml  Output 1800 ml  Net -1750 ml      Physical Exam    CVP 13  General:  supper morbidly obese, chronically ill AAM, on vent through TC HEENT: Normal Neck: thick neck, unable to visualize JVD, + TC. Carotids 2+ bilat; no bruits. No lymphadenopathy or thyromegaly appreciated. Cor: PMI nondisplaced. Regular rate & rhythm. No rubs, gallops or murmurs. Lungs: decreased BS anteriorly  Abdomen: obese, soft, nontender, nondistended. No hepatosplenomegaly. No bruits or masses. Good bowel sounds. Extremities: No cyanosis, clubbing, rash, 1+ b/l LEE + RUE PICC  Neuro: Alert & orientedx3, cranial nerves grossly intact. moves all 4 extremities w/o difficulty. Affect pleasant   Telemetry   NSR 90s   EKG    N/a   Labs    CBC Recent Labs    08/12/22 2030 08/12/22 2047 08/14/22 0604  WBC 7.6  --  8.9  NEUTROABS 5.2  --   --   HGB 9.9* 12.9* 10.2*  HCT 39.8 38.0* 41.2  MCV 82.4  --  84.8   PLT 253  --  227   Basic Metabolic Panel Recent Labs    16/10/96 0242 08/14/22 0604  NA 144 144  K 3.1* 4.0  CL 97* 100  CO2 39* 39*  GLUCOSE 105* 103*  BUN 14 11  CREATININE 1.35* 1.20  CALCIUM 7.8* 7.9*  MG 1.8 2.3  PHOS  --  4.1   Liver Function Tests Recent Labs    08/12/22 2030 08/14/22 0604  AST 13*  --   ALT 12  --   ALKPHOS 97  --   BILITOT 0.3  --   PROT 7.2  --   ALBUMIN 2.7* 2.7*   No results for input(s): "LIPASE", "AMYLASE" in the last 72 hours. Cardiac Enzymes No results for input(s): "CKTOTAL", "CKMB", "CKMBINDEX", "TROPONINI" in the last 72 hours.  BNP: BNP (last 3 results) Recent Labs    04/11/22 0612 07/10/22 0810 08/12/22 2030  BNP 60.4 17.5 27.7    ProBNP (last 3 results) No results for input(s): "PROBNP" in the last 8760 hours.   D-Dimer No results for input(s): "DDIMER" in the last 72 hours. Hemoglobin A1C No results for input(s): "HGBA1C" in the last 72 hours. Fasting Lipid Panel No results for input(s): "CHOL", "HDL", "LDLCALC", "TRIG", "CHOLHDL", "LDLDIRECT" in the last 72 hours. Thyroid Function Tests No results  for input(s): "TSH", "T4TOTAL", "T3FREE", "THYROIDAB" in the last 72 hours.  Invalid input(s): "FREET3"  Other results:   Imaging    ECHOCARDIOGRAM COMPLETE  Result Date: 08/13/2022    ECHOCARDIOGRAM REPORT   Patient Name:   Gary [name] Date of Exam: 08/13/2022 Medical Rec #:  [id number]      Height:       75.0 in Accession #:    [id number]     Weight:       415.0 lb Date of Birth:  [birth year]      BSA:          2.993 m Patient Age:    67 years       BP:           126/80 mmHg Patient Gender: M              HR:           88 bpm. Exam Location:  Inpatient Procedure: 2D Echo, Cardiac Doppler and Color Doppler Indications:    CHF I50.9  History:        Patient has prior history of Echocardiogram examinations, most                 recent 04/12/2022. CHF; Risk Factors:Dyslipidemia, Hypertension                 and  Former Smoker.  Sonographer:    Dondra Prader RVT RCS Referring Phys: [phone] AMY D CLEGG  Sonographer Comments: No subcostal window, patient is obese, Technically challenging study due to limited acoustic windows, Technically difficult study due to poor echo windows, suboptimal parasternal window and suboptimal apical window. Image acquisition challenging due to patient body habitus and Unable to accurately measure atrium due to very poor and limited windows. Declined definity IMPRESSIONS  1. Left ventricular ejection fraction, by estimation, is 65 to 70%. The left ventricle has normal function. The left ventricle has no regional wall motion abnormalities. There is mild left ventricular hypertrophy. Left ventricular diastolic parameters were normal.  2. Right ventricular systolic function is normal. The right ventricular size is normal.  3. The mitral valve is normal in structure. No evidence of mitral valve regurgitation. No evidence of mitral stenosis.  4. The aortic valve has an indeterminant number of cusps. Aortic valve regurgitation is not visualized. No aortic stenosis is present. FINDINGS  Left Ventricle: Left ventricular ejection fraction, by estimation, is 65 to 70%. The left ventricle has normal function. The left ventricle has no regional wall motion abnormalities. The left ventricular internal cavity size was normal in size. There is  mild left ventricular hypertrophy. Left ventricular diastolic parameters were normal. Right Ventricle: The right ventricular size is normal. Right ventricular systolic function is normal. Left Atrium: Left atrial size was normal in size. Right Atrium: Right atrial size was normal in size. Pericardium: There is no evidence of pericardial effusion. Mitral Valve: The mitral valve is normal in structure. No evidence of mitral valve regurgitation. No evidence of mitral valve stenosis. Tricuspid Valve: The tricuspid valve is normal in structure. Tricuspid valve regurgitation is  not demonstrated. No evidence of tricuspid stenosis. Aortic Valve: The aortic valve has an indeterminant number of cusps. Aortic valve regurgitation is not visualized. No aortic stenosis is present. Aortic valve mean gradient measures 7.0 mmHg. Aortic valve peak gradient measures 13.2 mmHg. Aortic valve area, by VTI measures 4.42 cm. Pulmonic Valve: The pulmonic valve was not well visualized. Pulmonic valve regurgitation is not visualized. No  evidence of pulmonic stenosis. Aorta: The aortic root is normal in size and structure. Venous: The inferior vena cava was not well visualized. IAS/Shunts: The interatrial septum was not well visualized.  LEFT VENTRICLE PLAX 2D LVIDd:         5.60 cm   Diastology LVIDs:         3.00 cm   LV e' medial:    8.55 cm/s LV PW:         1.20 cm   LV E/e' medial:  9.3 LV IVS:        1.50 cm   LV e' lateral:   17.60 cm/s LVOT diam:     2.60 cm   LV E/e' lateral: 4.5 LV SV:         121 LV SV Index:   40 LVOT Area:     5.31 cm  RIGHT VENTRICLE RV S prime:     21.30 cm/s TAPSE (M-mode): 3.1 cm LEFT ATRIUM         Index LA diam:    3.80 cm 1.27 cm/m  AORTIC VALVE                     PULMONIC VALVE AV Area (Vmax):    3.82 cm      PV Vmax:       0.62 m/s AV Area (Vmean):   3.69 cm      PV Peak grad:  1.5 mmHg AV Area (VTI):     4.42 cm AV Vmax:           182.00 cm/s AV Vmean:          122.000 cm/s AV VTI:            0.274 m AV Peak Grad:      13.2 mmHg AV Mean Grad:      7.0 mmHg LVOT Vmax:         131.00 cm/s LVOT Vmean:        84.700 cm/s LVOT VTI:          0.228 m LVOT/AV VTI ratio: 0.83  AORTA Ao Root diam: 3.40 cm Ao Asc diam:  3.30 cm MITRAL VALVE MV Area (PHT): 4.16 cm    SHUNTS MV Decel Time: 183 msec    Systemic VTI:  0.23 m MV E velocity: 79.50 cm/s  Systemic Diam: 2.60 cm MV A velocity: 88.65 cm/s MV E/A ratio:  0.90 Olga Millers MD Electronically signed by Olga Millers MD Signature Date/Time: 08/13/2022/5:39:41 PM    Final    [place] EKG SITE RITE  Result Date:  08/13/2022 If Site Rite image not attached, placement could not be confirmed due to current cardiac rhythm.    Medications:     Scheduled Medications:  acetaZOLAMIDE ER  500 mg Oral Q12H   Chlorhexidine Gluconate Cloth  6 each Topical Daily   furosemide  60 mg Intravenous BID   potassium chloride  40 mEq Oral Daily   sodium chloride flush  10-40 mL Intracatheter Q12H    Infusions:   PRN Medications: acetaminophen **OR** acetaminophen, ipratropium-albuterol, sodium chloride flush    Patient Profile   67 y/o male with super morbid obesity and chronic vent dependent respiratory failure. Now readmitted with recurrent dyspnea. AHF team asked to evaluate.  Assessment/Plan   1. Dyspnea : Multifactorial>>Acute/chronic respiratory failure, OSA, HFpEF.  -Trach - trach collar during the day and night time vent.  - CCM following  - Respiratory panel - negative.  -  Lactic acid 1. WBC is not elevated.  HS Trop 7>8. Procalcitionin < 0.1.  - Echo 12/23 reviewed personally EF 65% RV ok. No evidence of PAH.  - CT scan 3/24 with mildly dilated pulmonary arteries but no RV strain  2. Acute on Chronic HFpEF - Echo 03/2022. Echo EF preserved Grade IDD, RV normal - Echo this admit EF 65% RV ok. No evidence of PAH.  - CVP 13. CO2 39  - Continue IV Lasix 60 mg BID. Add Diamox, 500 bid added by CCM  - Scr stable, continue to follow w/ diuresis    3. Obesity  - Body mass index is 51.87 kg/m.   4. CKD Stage II -Creatinine baseline 1.3-1.5  -Scr 1.20 today    5 GOC -Recommend Palliative Care for GOC    Length of Stay: 1  Robbie Lis, PA-C  08/14/2022, 9:05 AM  Advanced Heart Failure Team Pager [phone] (M-F; 7a - 5p)  Please contact [facility] for night-coverage after hours (5p -7a ) and weekends on [email or web address]  Patient seen and examined with the above-signed Advanced Practice Provider and/or Housestaff. I personally reviewed laboratory data, imaging studies and relevant  notes. I independently examined the patient and formulated the important aspects of the plan. I have edited the note to reflect any of my changes or salient points. I have personally discussed the plan with the patient and/or family.  Diuresing well. Denies CP or SOB.  Scr improving. CVP 13. Co-ox ok  Echo with normal LV/RV function.   General:  bese male sitting up in bed  HEENT: normal Neck: supple. Hard to see JVP + trach Carotids 2+ bilat; no bruits. No lymphadenopathy or thryomegaly appreciated. Cor: PMI nondisplaced. Regular rate & rhythm. No rubs, gallops or murmurs. Lungs: clear Abdomen: markedly obese nontender, nondistended.No bruits or masses. Good bowel sounds. Extremities: no cyanosis, clubbing, rash, tr-1+ edema Neuro: alert & orientedx3, cranial nerves grossly intact. moves all 4 extremities w/o difficulty. Affect pleasant  He has mild volume overload but otherwise normal LV/RV function with no clear evidence of RV strain on echo. Doubt RHC would change management. Agree with diuresis as tolerated.   The AHF team will sign off. Please call with questions.   Arvilla Meres, MD  5:21 PM

## 2022-08-15 DIAGNOSIS — J449 Chronic obstructive pulmonary disease, unspecified: Secondary | ICD-10-CM | POA: Diagnosis not present

## 2022-08-15 DIAGNOSIS — I509 Heart failure, unspecified: Secondary | ICD-10-CM | POA: Diagnosis not present

## 2022-08-15 DIAGNOSIS — G4733 Obstructive sleep apnea (adult) (pediatric): Secondary | ICD-10-CM | POA: Diagnosis not present

## 2022-08-15 DIAGNOSIS — R0902 Hypoxemia: Secondary | ICD-10-CM

## 2022-08-15 DIAGNOSIS — I5033 Acute on chronic diastolic (congestive) heart failure: Secondary | ICD-10-CM | POA: Diagnosis not present

## 2022-08-15 DIAGNOSIS — N1831 Chronic kidney disease, stage 3a: Secondary | ICD-10-CM

## 2022-08-15 LAB — CBC WITH DIFFERENTIAL/PLATELET
Abs Immature Granulocytes: 0.07 10*3/uL (ref 0.00–0.07)
Basophils Absolute: 0 10*3/uL (ref 0.0–0.1)
Basophils Relative: 1 %
Eosinophils Absolute: 0.1 10*3/uL (ref 0.0–0.5)
Eosinophils Relative: 2 %
HCT: 41.2 % (ref 39.0–52.0)
Hemoglobin: 10 g/dL — ABNORMAL LOW (ref 13.0–17.0)
Immature Granulocytes: 1 %
Lymphocytes Relative: 15 %
Lymphs Abs: 1.2 10*3/uL (ref 0.7–4.0)
MCH: 20.9 pg — ABNORMAL LOW (ref 26.0–34.0)
MCHC: 24.3 g/dL — ABNORMAL LOW (ref 30.0–36.0)
MCV: 86.2 fL (ref 80.0–100.0)
Monocytes Absolute: 0.8 10*3/uL (ref 0.1–1.0)
Monocytes Relative: 10 %
Neutro Abs: 5.5 10*3/uL (ref 1.7–7.7)
Neutrophils Relative %: 71 %
Platelets: 175 10*3/uL (ref 150–400)
RBC: 4.78 MIL/uL (ref 4.22–5.81)
RDW: 17.8 % — ABNORMAL HIGH (ref 11.5–15.5)
WBC: 7.7 10*3/uL (ref 4.0–10.5)
nRBC: 0.3 % — ABNORMAL HIGH (ref 0.0–0.2)

## 2022-08-15 LAB — BASIC METABOLIC PANEL
Anion gap: 6 (ref 5–15)
BUN: 17 mg/dL (ref 8–23)
CO2: 39 mmol/L — ABNORMAL HIGH (ref 22–32)
Calcium: 7.9 mg/dL — ABNORMAL LOW (ref 8.9–10.3)
Chloride: 94 mmol/L — ABNORMAL LOW (ref 98–111)
Creatinine, Ser: 1.35 mg/dL — ABNORMAL HIGH (ref 0.61–1.24)
GFR, Estimated: 58 mL/min — ABNORMAL LOW (ref >60)
Glucose, Bld: 90 mg/dL (ref 70–99)
Potassium: 4.1 mmol/L (ref 3.5–5.1)
Sodium: 139 mmol/L (ref 135–145)

## 2022-08-15 MED ORDER — MIDODRINE HCL 5 MG PO TABS
5.0000 mg | ORAL_TABLET | Freq: Three times a day (TID) | ORAL | Status: DC
  Administered 2022-08-15 – 2022-08-18 (×8): 5 mg via ORAL
  Filled 2022-08-15 (×8): qty 1

## 2022-08-15 MED ORDER — FUROSEMIDE 10 MG/ML IJ SOLN
40.0000 mg | Freq: Two times a day (BID) | INTRAMUSCULAR | Status: DC
  Administered 2022-08-15 – 2022-08-18 (×7): 40 mg via INTRAVENOUS
  Filled 2022-08-15 (×6): qty 4

## 2022-08-15 MED ORDER — ALBUMIN HUMAN 25 % IV SOLN: 25.0000 g | Freq: Four times a day (QID) | INTRAVENOUS | Status: DC

## 2022-08-15 MED ORDER — MIDODRINE HCL 5 MG PO TABS
10.0000 mg | ORAL_TABLET | Freq: Three times a day (TID) | ORAL | Status: DC
  Administered 2022-08-15: 10 mg via ORAL
  Filled 2022-08-15: qty 2

## 2022-08-15 NOTE — Progress Notes (Signed)
[facility]                                                                              Gary [name], is a 67 y.o. male, DOB - 1956/01/25, ZOX:[id number] Admit date - 08/12/2022    Outpatient Primary MD for the patient is [facility]  LOS - 2  days  Chief Complaint  Patient presents with   Shortness of Breath       Brief summary   Patient is a 67 year old male with chronic respiratory failure + tracheotomy (Shiley XLT size 7 uncuffed) since 10/03/2021 --lengthy hospitalization, 09/19/2021-12/13/2021 admission at [facility] (Acute respiratory failure, Vap, AKI with hyperkalemia, melena with active GI bleed proximal sigmoid colon + embolization by IR, multiple transfusions etc. etc.-see discharge summary dated 12/13/2021) uses nocturnal vent currently, COPD--OSA-now on nocturnal vent, prior GI bleed requiring IR intervention as above, hypertension, CKD stage IIIb, morbid obesity, multiple prolonged hospitalizations over the year.   Admitted 04/26/2022-07/25/2022 with acute on chronic respiratory failure, was diuresed, hospitalization was complicated by difficult to place, trach and qhs vent.  He is on trach collar during the day 25 to 30% on vent at night.  Patient presented to ED with increasing shortness of breath.  He was on torsemide 20 mg daily, reported increasing dyspnea, bilateral lower extremity edema and weight gain.  Also reported increased oxygen requirement.  Stated that he was seen in the ED in [place] a few days ago and was placed on antibiotic for pneumonia, which he never picked up from pharmacy.  Also had substernal chest pain for several days.  Patient was requiring 10 L supplemental O2, 50% FiO2 via trach to maintain sats in 90s, afebrile.  Hemoglobin 9.9, no leukocytosis.  COVID influenza, RSV PCR negative.  Chest x-ray concerning for pulmonary edema.  PCCM and cardiology was consulted     Assessment & Plan    Principal Problem:   Acute on  chronic heart failure with preserved ejection fraction (HFpEF) ([name]), dyspnea multifactorial -Cardiology was consulted.  2D echo this admit showed EF of 65%, no evidence of PAH. -Patient was started on IV Lasix 60 mg twice daily and Diamox was added 500 mg twice daily (outpatient on torsemide 20 mg daily) -Noted BP soft today, creatinine trended up to 1.35, decreased IV Lasix to 40 mg IV twice daily.  Added midodrine for BP. -CHF team following.  Active Problems:  Acute on chronic respiratory failure with hypoxia, trach/vent dependent, OSA, OHS -Trach collar during the day and on nighttime vent. -Respiratory virus panel negative. -CCM following, appreciate evaluation today.  Patient uses trelege at home at night with his cuff deflated.  However, refusing hospital vent due to alarms going off consistently due to leakage and waking him up.  PCCM has spoken with respiratory and adjusted alarm settings, continue vent at night. -Patient will remain in ICU for his ventilator requirements. -Palliative medicine was consulted for GOC, remains full code    OSA (obstructive sleep apnea), COPD -Vent dependent at night, quit smoking several years ago -Continue vent adjustments per CCM    Chronic kidney disease, stage 3a ([name]) - Cr trended  up today 1.3, decreased IV Lasix to 40 mg twice daily -Received Diamox 500 mg twice daily for 2 doses on 4/30  Depression -Continue Lexapro, hydroxyzine  Chronic DVT -Continue eliquis  Morbid obesity Estimated body mass index is 50.92 kg/m as calculated from the following:   Height as of this encounter: 6\' 3"  (1.905 m).   Weight as of this encounter: 184.8 kg.  Code Status: Full code DVT Prophylaxis:  apixaban (ELIQUIS) tablet 2.5 mg Start: 08/15/22 1000 apixaban (ELIQUIS) tablet 2.5 mg   Level of Care: Level of care: ICU Family Communication: No family at bedside Disposition Plan:      Remains inpatient appropriate:      Procedures:  2D  echo  Consultants:   CCM Cardiology  Antimicrobials:   Medications  apixaban  2.5 mg Oral BID   Chlorhexidine Gluconate Cloth  6 each Topical Daily   escitalopram  5 mg Oral Daily   ferrous sulfate  325 mg Oral Q breakfast   furosemide  40 mg Intravenous BID   midodrine  10 mg Oral TID WC   pantoprazole  40 mg Oral BID AC   potassium chloride  40 mEq Oral Daily   sodium chloride flush  10-40 mL Intracatheter Q12H      Subjective:   Gary [name] was seen and examined today.  Seen earlier this morning, requiring suctioning, BP soft however could be inaccurate due to cuff issues.  No chest pain, nausea or vomiting.  Tolerating regular diet.  No fevers   Objective:   Vitals:   08/15/22 1000 08/15/22 1100 08/15/22 1123 08/15/22 1214  BP: (!) 100/56 106/65    Pulse: 86 98 85   Resp: (!) 38 (!) 31 (!) 34   Temp:      TempSrc:      SpO2: 94% (!) 85% 91% 91%  Weight:      Height:        Intake/Output Summary (Last 24 hours) at 08/15/2022 1307 Last data filed at 08/15/2022 1100 Gross per 24 hour  Intake 1040 ml  Output 3625 ml  Net -2585 ml     Wt Readings from Last 3 Encounters:  08/15/22 (!) 184.8 kg  04/14/22 (!) 160.4 kg  04/06/22 (!) 193.2 kg     Exam General: Alert and oriented x 3, Cardiovascular: S1 S2 auscultated,  RRR Respiratory: Clear to auscultation anteriorly Gastrointestinal: Obese, soft, nontender, nondistended, + bowel sounds Ext: no pedal edema bilaterally Neuro: no new deficits Psych: Normal affect     Data Reviewed:  I have personally reviewed following labs    CBC Lab Results  Component Value Date   WBC 7.7 08/15/2022   RBC 4.78 08/15/2022   HGB 10.0 (L) 08/15/2022   HCT 41.2 08/15/2022   MCV 86.2 08/15/2022   MCH 20.9 (L) 08/15/2022   PLT 175 08/15/2022   MCHC 24.3 (L) 08/15/2022   RDW 17.8 (H) 08/15/2022   LYMPHSABS 1.2 08/15/2022   MONOABS 0.8 08/15/2022   EOSABS 0.1 08/15/2022   BASOSABS 0.0 08/15/2022     Last  metabolic panel Lab Results  Component Value Date   NA 139 08/15/2022   K 4.1 08/15/2022   CL 94 (L) 08/15/2022   CO2 39 (H) 08/15/2022   BUN 17 08/15/2022   CREATININE 1.35 (H) 08/15/2022   GLUCOSE 90 08/15/2022   GFRNONAA 58 (L) 08/15/2022   GFRAA 91 03/15/2020   CALCIUM 7.9 (L) 08/15/2022   PHOS 4.1 08/14/2022   PROT 7.2 08/12/2022  ALBUMIN 2.7 (L) 08/14/2022   BILITOT 0.3 08/12/2022   ALKPHOS 97 08/12/2022   AST 13 (L) 08/12/2022   ALT 12 08/12/2022   ANIONGAP 6 08/15/2022    CBG (last 3)  No results for input(s): "GLUCAP" in the last 72 hours.    Coagulation Profile: No results for input(s): "INR", "PROTIME" in the last 168 hours.   Radiology Studies: I have personally reviewed the imaging studies  ECHOCARDIOGRAM COMPLETE  Result Date: 08/13/2022    ECHOCARDIOGRAM REPORT   Patient Name:   MICHOLAS DRUMWRIGHT Date of Exam: 08/13/2022 Medical Rec #:  [id number]      Height:       75.0 in Accession #:    [id number]     Weight:       415.0 lb Date of Birth:  January 31, 1956      BSA:          2.993 m Patient Age:    67 years       BP:           126/80 mmHg Patient Gender: M              HR:           88 bpm. Exam Location:  Inpatient Procedure: 2D Echo, Cardiac Doppler and Color Doppler Indications:    CHF I50.9  History:        Patient has prior history of Echocardiogram examinations, most                 recent 04/12/2022. CHF; Risk Factors:Dyslipidemia, Hypertension                 and Former Smoker.  Sonographer:    Dondra Prader RVT RCS Referring Phys: [phone] AMY D CLEGG  Sonographer Comments: No subcostal window, patient is obese, Technically challenging study due to limited acoustic windows, Technically difficult study due to poor echo windows, suboptimal parasternal window and suboptimal apical window. Image acquisition challenging due to patient body habitus and Unable to accurately measure atrium due to very poor and limited windows. Declined definity IMPRESSIONS  1. Left ventricular  ejection fraction, by estimation, is 65 to 70%. The left ventricle has normal function. The left ventricle has no regional wall motion abnormalities. There is mild left ventricular hypertrophy. Left ventricular diastolic parameters were normal.  2. Right ventricular systolic function is normal. The right ventricular size is normal.  3. The mitral valve is normal in structure. No evidence of mitral valve regurgitation. No evidence of mitral stenosis.  4. The aortic valve has an indeterminant number of cusps. Aortic valve regurgitation is not visualized. No aortic stenosis is present. FINDINGS  Left Ventricle: Left ventricular ejection fraction, by estimation, is 65 to 70%. The left ventricle has normal function. The left ventricle has no regional wall motion abnormalities. The left ventricular internal cavity size was normal in size. There is  mild left ventricular hypertrophy. Left ventricular diastolic parameters were normal. Right Ventricle: The right ventricular size is normal. Right ventricular systolic function is normal. Left Atrium: Left atrial size was normal in size. Right Atrium: Right atrial size was normal in size. Pericardium: There is no evidence of pericardial effusion. Mitral Valve: The mitral valve is normal in structure. No evidence of mitral valve regurgitation. No evidence of mitral valve stenosis. Tricuspid Valve: The tricuspid valve is normal in structure. Tricuspid valve regurgitation is not demonstrated. No evidence of tricuspid stenosis. Aortic Valve: The aortic valve has an indeterminant number of  cusps. Aortic valve regurgitation is not visualized. No aortic stenosis is present. Aortic valve mean gradient measures 7.0 mmHg. Aortic valve peak gradient measures 13.2 mmHg. Aortic valve area, by VTI measures 4.42 cm. Pulmonic Valve: The pulmonic valve was not well visualized. Pulmonic valve regurgitation is not visualized. No evidence of pulmonic stenosis. Aorta: The aortic root is normal in  size and structure. Venous: The inferior vena cava was not well visualized. IAS/Shunts: The interatrial septum was not well visualized.  LEFT VENTRICLE PLAX 2D LVIDd:         5.60 cm   Diastology LVIDs:         3.00 cm   LV e' medial:    8.55 cm/s LV PW:         1.20 cm   LV E/e' medial:  9.3 LV IVS:        1.50 cm   LV e' lateral:   17.60 cm/s LVOT diam:     2.60 cm   LV E/e' lateral: 4.5 LV SV:         121 LV SV Index:   40 LVOT Area:     5.31 cm  RIGHT VENTRICLE RV S prime:     21.30 cm/s TAPSE (M-mode): 3.1 cm LEFT ATRIUM         Index LA diam:    3.80 cm 1.27 cm/m  AORTIC VALVE                     PULMONIC VALVE AV Area (Vmax):    3.82 cm      PV Vmax:       0.62 m/s AV Area (Vmean):   3.69 cm      PV Peak grad:  1.5 mmHg AV Area (VTI):     4.42 cm AV Vmax:           182.00 cm/s AV Vmean:          122.000 cm/s AV VTI:            0.274 m AV Peak Grad:      13.2 mmHg AV Mean Grad:      7.0 mmHg LVOT Vmax:         131.00 cm/s LVOT Vmean:        84.700 cm/s LVOT VTI:          0.228 m LVOT/AV VTI ratio: 0.83  AORTA Ao Root diam: 3.40 cm Ao Asc diam:  3.30 cm MITRAL VALVE MV Area (PHT): 4.16 cm    SHUNTS MV Decel Time: 183 msec    Systemic VTI:  0.23 m MV E velocity: 79.50 cm/s  Systemic Diam: 2.60 cm MV A velocity: 88.65 cm/s MV E/A ratio:  0.90 Olga Millers MD Electronically signed by Olga Millers MD Signature Date/Time: 08/13/2022/5:39:41 PM    Final        Thad Ranger M.D. [facility] 08/15/2022, 1:07 PM  Available via Epic secure chat 7am-7pm After 7 pm, please refer to night coverage provider listed on amion.

## 2022-08-15 NOTE — Progress Notes (Addendum)
PCCM called due to patient not wearing ventilator at night. Patient is trached and wears trelegy vent at home at night. Patient wears his vent at home with his cuff deflated and doesn't have any issues. When wearing our ventilator alarm consistently goes off due to leakage around trach waking him up and he's been refusing our ventilator the last few days. Patient states his sister Gary [name] is supposed to bring his home vent tomorrow. Until then, spoke with respiratory and adjusted alarm settings to help improve compliance with vent. Will continue vent at night as he wears at home. Patient will need to remain in icu for his ventilator requirements.  JD Anselm Lis [facility] 08/15/2022, 12:19 PM  Please see [email or web address] for pager details.  From 7A-7P if no response, please call [phone]. After hours, please call ELink [phone].

## 2022-08-15 NOTE — Progress Notes (Signed)
Palliative:  Consult received. Chart reviewed.  Patient declines speaking to me today, asks me to try again tomorrow. Tells me he is too tired today.  Gerlean Ren, DNP, AGNP-C Palliative Medicine Team Team Phone # [phone]  Pager # [phone]  NO CHARGE

## 2022-08-15 NOTE — Consult Note (Signed)
   NAME:  Gary [name], MRN:  [id number], DOB:  05/19/1955, LOS: 2 ADMISSION DATE:  08/12/2022, CONSULTATION DATE:  08/13/22 REFERRING MD:  EDP, CHIEF COMPLAINT:  SOB   History of Present Illness:  67 year old man w/ COPD/OSA s/p trach last year presenting with acute on chronic dyspnea, LE edema, orthopnea.  Uses PMV during day, no vent dependence, ~6lpm blowby baseline. PCCM consulted to help with trach management.  He does admit he is due for a trach change.  ROS as below  Pertinent  Medical History  Class 3 obesity COPD OSA/OHS Prior GIB requiring IR intervention  Significant Hospital Events: Including procedures, antibiotic start and stop dates in addition to other pertinent events   4/28 admit 4/29 pccm consult  Interim History / Subjective:  Called to reassess today as patient has been refusing to use overnight mechanical ventilation because he has not wanted tracheostomy to be exchanged for a cuffed trach.  Objective   Blood pressure (!) 102/57, pulse 82, temperature 98.4 F (36.9 C), resp. rate (!) 36, height 6\' 3"  (1.905 m), weight (!) 184.8 kg, SpO2 91 %. CVP:  [5 mmHg-15 mmHg] 11 mmHg  Vent Mode: PCV FiO2 (%):  [40 %-50 %] 50 % Set Rate:  [15 bmp-20 bmp] 15 bmp PEEP:  [5 cmH20] 5 cmH20   Intake/Output Summary (Last 24 hours) at 08/15/2022 1840 Last data filed at 08/15/2022 1700 Gross per 24 hour  Intake 2000 ml  Output 3400 ml  Net -1400 ml   Filed Weights   08/12/22 1826 08/14/22 0500 08/15/22 0600  Weight: (!) 188.2 kg (!) 181.7 kg (!) 184.8 kg    Examination: General: deconditioned obese man talking with PMV.  Super morbidly obese. HENT: trach in place with PMV; some R periorbital swelling Lungs: Diminished due to body habitus, mildly tachypneic Cardiovascular: Distant, tele on monitor Abdomen: soft, hypoactive BS Extremities: diffuse pitting edema mild Neuro: moves ext to command GU: bedside urinal with yellow urine  BNP neg BMP at baseline CKD CXR  wet  Resolved Hospital Problem list   N/A  Assessment & Plan:  Decompensated cor pulmonale  Guess at dry weight in 2022 was 350 lbs  Guess at dry weight 2021 363 lbs Currently 415 lbs so have some room to go S/P tracheostomy, chronic vent dependence CKD 2 Acquired deconditioning from multiple hospitalizations  -Patient is on chronic mechanical ventilation at home.  Frequently home ventilator patient's ventilate through the a cuffless tracheostomy.  And tolerate some degree of air leak. -I have adjusted the ventilator's alarm settings while the patient was connected to the ventilator for a nap.  Normal alarms sounded with XL minute ventilation alarm set to as low as possible. -If alarms continue to be a problem, could switch the ventilator to noninvasive mode which has a higher tolerance for leakage.   Best Practice (right click and "Reselect all SmartList Selections" daily)   Per primary  Lynnell Catalan, MD [facility] ICU Physician [facility] [place] [facility]  Pager: [phone] Or Epic Secure Chat After hours: [phone].  08/15/2022, 6:45 PM

## 2022-08-15 NOTE — TOC Progression Note (Signed)
Transition of Care [facility]) - Progression Note    Patient Details  Name: Gary [name] MRN: [id number] Date of Birth: 12/05/1955  Transition of Care [facility]) CM/SW Contact  Delilah Shan, LCSWA Phone Number: 08/15/2022, 3:06 PM  Clinical Narrative:     CSW spoke with patient at bedside in regards to PT recommendations.Patient agreeable to be faxed out for SNF CSW discussed with patient possible barriers to placement. CSW informed patient will continue to follow and will fax out for SNF closer to patient being medically ready. CSW following to fax out closer to patient being ready and trach is at 28%. CSW will continue to follow and assist with patients dc planning needs.  Expected Discharge Plan: Skilled Nursing Facility Barriers to Discharge: Continued Medical Work up  Expected Discharge Plan and Services   Discharge Planning Services: CM Consult Post Acute Care Choice: Skilled Nursing Facility Living arrangements for the past 2 months: Apartment                                       Social Determinants of Health (SDOH) Interventions SDOH Screenings   Food Insecurity: No Food Insecurity (08/13/2022)  Housing: Low Risk  (08/13/2022)  Transportation Needs: Unmet Transportation Needs (08/13/2022)  Utilities: Not At Risk (08/13/2022)  Depression (PHQ2-9): Low Risk  (06/07/2021)  Financial Resource Strain: Medium Risk (05/03/2017)  Physical Activity: Insufficiently Active (05/03/2017)  Social Connections: Moderately Integrated (05/03/2017)  Stress: No Stress Concern Present (05/03/2017)  Tobacco Use: Medium Risk (04/26/2022)    Readmission Risk Interventions    12/31/2021    8:58 AM 12/20/2021    4:21 PM 05/04/2021    1:26 PM  Readmission Risk Prevention Plan  Transportation Screening Complete Complete Complete  Medication Review Oceanographer) Complete Complete Complete  PCP or Specialist appointment within 3-5 days of discharge Complete Complete Complete  HRI or Home Care  Consult Complete Complete Complete  SW Recovery Care/Counseling Consult Complete Complete Complete  Palliative Care Screening Complete Complete Not Applicable  Skilled Nursing Facility Not Applicable Not Applicable Not Applicable

## 2022-08-15 NOTE — Evaluation (Addendum)
Occupational Therapy Evaluation Patient Details Name: Gary [name] MRN: [id number] DOB: March 11, 1956 Today's Date: 08/15/2022   History of Present Illness Pt is a 67 y.o. male admitted 08/12/22 with SOB; workup for acute on chronic respiratory failure, HFpEF, OSA. Of note, multiple recent admissions, including prolonged admission (04/2022-07/2022) with acute bronchitis/COPD exacerbation, ultimately d/c to SNF; recent ED visit 08/07/22 with concern for PNA, pt never picked up antibiotic. Other PMH includes chronic respiratory failure s/p trach (2023), HFpEF, COPD, HTN, Hep C, DDD, OSA, obesity, CKD3, anemia, anxiety, depression.   Clinical Impression   Pt reports independence at baseline with ADLs and using RW for mobility, was supposed to have [facility] services after SNF stay but reports they never started. Pt currenlty declines OOB/EOB mobility, able to reach arms over head and pull self up in bed with min guard A and cues for hand positioning. Pt needing increased encouragement and very particular wanting therapist/RN to adjust bed multiple times during session. Pt presenting with impairments listed below, will follow acutely. Patient will benefit from continued inpatient follow up therapy, <3 hours/day to maximize safety and independence with ADLs/functional mobility.       Recommendations for follow up therapy are one component of a multi-disciplinary discharge planning process, led by the attending physician.  Recommendations may be updated based on patient status, additional functional criteria and insurance authorization.   Assistance Recommended at Discharge Frequent or constant Supervision/Assistance  Patient can return home with the following A lot of help with bathing/dressing/bathroom;Help with stairs or ramp for entrance;Two people to help with walking and/or transfers;Direct supervision/assist for medications management;Direct supervision/assist for financial management;Assist for  transportation;Assistance with cooking/housework    Functional Status Assessment  Patient has had a recent decline in their functional status and demonstrates the ability to make significant improvements in function in a reasonable and predictable amount of time.  Equipment Recommendations  Other (comment) (defer)    Recommendations for Other Services PT consult     Precautions / Restrictions Precautions Precautions: Fall;Other (comment) Precaution Comments: trach collar (wearing PMSV) Restrictions Weight Bearing Restrictions: No      Mobility Bed Mobility Overal bed mobility: Needs Assistance             General bed mobility comments: pt reaches overhead to pull self up with min guard A    Transfers                   General transfer comment: pt declines      Balance       Sitting balance - Comments: unable to assess       Standing balance comment: unable to assess                           ADL either performed or assessed with clinical judgement   ADL Overall ADL's : Needs assistance/impaired Eating/Feeding: Modified independent;Bed level Eating/Feeding Details (indicate cue type and reason): takes pills at semi-reclined in bed Grooming: Set up;Bed level   Upper Body Bathing: Moderate assistance   Lower Body Bathing: Maximal assistance   Upper Body Dressing : Moderate assistance   Lower Body Dressing: Maximal assistance   Toilet Transfer: Maximal assistance;+2 for physical assistance   Toileting- Clothing Manipulation and Hygiene: Maximal assistance       Functional mobility during ADLs: Maximal assistance;+2 for physical assistance       Vision   Vision Assessment?: No apparent visual deficits     Perception  Perception Perception Tested?: No   Praxis Praxis Praxis tested?: Not tested    Pertinent Vitals/Pain Pain Assessment Pain Assessment: Faces     Hand Dominance     Extremity/Trunk Assessment Upper  Extremity Assessment Upper Extremity Assessment: Generalized weakness   Lower Extremity Assessment Lower Extremity Assessment: Defer to PT evaluation       Communication Communication Communication: Tracheostomy;Passy-Muir valve   Cognition Arousal/Alertness: Awake/alert Behavior During Therapy: WFL for tasks assessed/performed Overall Cognitive Status: Within Functional Limits for tasks assessed Area of Impairment: Following commands, Safety/judgement, Awareness                       Following Commands: Follows one step commands with increased time Safety/Judgement: Decreased awareness of deficits, Decreased awareness of safety Awareness: Intellectual   General Comments: pt repeatedly asking for bed to be adjusted despite therapist and RN telling pt bed cannot be lowered any more     General Comments  educ re: role of acute PT, POC, activity recommendations, importance of OOB mobility. SpO2 84-89% on supplemental O2 via trach collar; PMSV donned during session. increased time discussing discharge plan as pt reports difficulty managing at home since [facility] services never started - reached out to case manager for assist with discharge planning    Exercises     Shoulder Instructions      Home Living Family/patient expects to be discharged to:: Private residence Living Arrangements: Spouse/significant other Available Help at Discharge: Family;Personal care attendant Type of Home: Apartment Home Access: Level entry;Ramped entrance     Home Layout: One level               Home Equipment: Cane - single point;Wheelchair - Nurse, children's (2 wheels);Hospital bed   Additional Comments: limited with information he provides, pt preoccupied with wanting to be repositioned in bed.      Prior Functioning/Environment Prior Level of Function : Needs assist             Mobility Comments: recently d/c from [facility] to SNF on 07/25/22, thend discharge home ~1  wk ago, but [facility] services apparently never started. pt reports he was standing and ambulating short distances with bariatric RW at [facility] and rehab, though increased weakness the past few days having difficulty doing for himself ADLs Comments: reports ind with ADLs at baseline        OT Problem List: Decreased strength;Decreased activity tolerance;Decreased range of motion;Impaired balance (sitting and/or standing);Decreased safety awareness      OT Treatment/Interventions: Self-care/ADL training;Therapeutic exercise;Energy conservation;DME and/or AE instruction;Therapeutic activities;Patient/family education;Balance training    OT Goals(Current goals can be found in the care plan section) Acute Rehab OT Goals Patient Stated Goal: to go home OT Goal Formulation: With patient Time For Goal Achievement: 08/29/22 Potential to Achieve Goals: Fair ADL Goals Pt Will Perform Upper Body Dressing: (P) with min assist;sitting Pt Will Perform Lower Body Dressing: (P) with min assist;sitting/lateral leans;sit to/from stand Pt Will Transfer to Toilet: (P) with min assist;squat pivot transfer;stand pivot transfer;bedside commode Additional ADL Goal #1: (P) pt will perform bed mobility minA in prep for ADLs  OT Frequency: Min 1X/week    Co-evaluation              AM-PAC OT "6 Clicks" Daily Activity     Outcome Measure Help from another person eating meals?: None Help from another person taking care of personal grooming?: A Little Help from another person toileting, which includes using toliet, bedpan, or urinal?: A  Lot Help from another person bathing (including washing, rinsing, drying)?: A Lot Help from another person to put on and taking off regular upper body clothing?: A Lot Help from another person to put on and taking off regular lower body clothing?: A Lot 6 Click Score: 15   End of Session Nurse Communication: Mobility status  Activity Tolerance: Patient tolerated treatment  well Patient left: in bed;with call bell/phone within reach;with nursing/sitter in room  OT Visit Diagnosis: Other abnormalities of gait and mobility (R26.89);Muscle weakness (generalized) (M62.81)                Time: 7829-5621 OT Time Calculation (min): 22 min Charges:  OT General Charges $OT Visit: 1 Visit OT Evaluation $OT Eval Low Complexity: 1 Low  Carver Fila, OTD, OTR/L SecureChat Preferred Acute Rehab [phone]   Carver Fila Koonce 08/15/2022, 2:43 PM

## 2022-08-15 NOTE — Evaluation (Signed)
Physical Therapy Evaluation Patient Details Name: Gary [name] MRN: [id number] DOB: December 10, 1955 Today's Date: 08/15/2022  History of Present Illness  Pt is a 67 y.o. male admitted 08/12/22 with SOB; workup for acute on chronic respiratory failure, HFpEF, OSA. Of note, multiple recent admissions, including prolonged admission (04/2022-07/2022) with acute bronchitis/COPD exacerbation, ultimately d/c to SNF; recent ED visit 08/07/22 with concern for PNA, pt never picked up antibiotic. Other PMH includes chronic respiratory failure s/p trach (2023), HFpEF, COPD, HTN, Hep C, DDD, OSA, obesity, CKD3, anemia, anxiety, depression.   Clinical Impression  Pt presents with an overall decrease in functional mobility secondary to above. PTA, pt recently discharge home from SNF rehab where he was ambulating short distances with RW; pt reports increased difficulty caring for self at home since [facility] services never started. Today, pt requiring modA for bed mobility; pt declines standing or ambulation until sister able to bring his shoes tomorrow due to foot pain, though was able to stand and get to recliner with nursing staff yesterday. Increased time discussing discharge plan, pt recognizes his need for increased assist at home. Pt would benefit from post-acute rehab services (<3 hrs/day) to maximize functional mobility and independence. Will follow acutely to address established goals.    Recommendations for follow up therapy are one component of a multi-disciplinary discharge planning process, led by the attending physician.  Recommendations may be updated based on patient status, additional functional criteria and insurance authorization.  Follow Up Recommendations Can patient physically be transported by private vehicle: No     Assistance Recommended at Discharge Frequent or constant Supervision/Assistance  Patient can return home with the following  A lot of help with walking and/or transfers;A lot of help with  bathing/dressing/bathroom;Assistance with cooking/housework;Assist for transportation;Help with stairs or ramp for entrance    Equipment Recommendations Other (comment) (TBD)  Recommendations for Other Services       Functional Status Assessment Patient has had a recent decline in their functional status and demonstrates the ability to make significant improvements in function in a reasonable and predictable amount of time.     Precautions / Restrictions Precautions Precautions: Fall;Other (comment) Precaution Comments: trach collar (wearing PMSV) Restrictions Weight Bearing Restrictions: No      Mobility  Bed Mobility Overal bed mobility: Needs Assistance Bed Mobility: Rolling Rolling: Mod assist, +2 for safety/equipment         General bed mobility comments: modA+2 for hip rotation and LE management rolling R/L in bed, heavy use of bed rail; bed in trendelenberg, pt able to scoot self up with BUE top rail support    Transfers                   General transfer comment:  (pt declined standing or walking attempts until shoes delivered tomorrow; declines standing or lift transfer to recliner)    Ambulation/Gait                  Stairs            Wheelchair Mobility    Modified Rankin (Stroke Patients Only)       Balance                                             Pertinent Vitals/Pain Pain Assessment Pain Assessment: Faces Faces Pain Scale: Hurts a little bit Pain Location: bilateral feet Pain  Descriptors / Indicators: Discomfort Pain Intervention(s): Monitored during session, Limited activity within patient's tolerance    Home Living Family/patient expects to be discharged to:: Private residence Living Arrangements: Spouse/significant other Available Help at Discharge: Family;Personal care attendant Type of Home: Apartment Home Access: Level entry;Ramped entrance       Home Layout: One level Home Equipment:  Cane - single point;Wheelchair - Nurse, children's (2 wheels);Hospital bed Additional Comments: pt states he has a "good support system." reports he will be getting electric w/c soon. per chart, had hoyer lift - need to confirm    Prior Function Prior Level of Function : Needs assist             Mobility Comments: recently d/c from [facility] to SNF on 07/25/22, thend discharge home ~1 wk ago, but [facility] services apparently never started. pt reports he was standing and ambulating short distances with bariatric RW at [facility] and rehab, though increased weakness the past few days having difficulty doing for himself       Hand Dominance        Extremity/Trunk Assessment   Upper Extremity Assessment Upper Extremity Assessment: Overall WFL for tasks assessed    Lower Extremity Assessment Lower Extremity Assessment: Generalized weakness (reports h/o R knee injury, poor tolerance for knee ROM with bed mobility)       Communication   Communication: Tracheostomy;Passy-Muir valve  Cognition Arousal/Alertness: Awake/alert Behavior During Therapy: WFL for tasks assessed/performed Overall Cognitive Status: Within Functional Limits for tasks assessed                                 General Comments: WFL for simple tasks, not formally assessed. pt very particular about things being done a certain way. cooperative during session        General Comments General comments (skin integrity, edema, etc.): educ re: role of acute PT, POC, activity recommendations, importance of OOB mobility. SpO2 84-89% on supplemental O2 via trach collar; PMSV donned during session. increased time discussing discharge plan as pt reports difficulty managing at home since [facility] services never started - reached out to case manager for assist with discharge planning    Exercises General Exercises - Lower Extremity Ankle Circles/Pumps: AROM, Both, Supine Short Arc Quad: AROM, Both, Supine    Assessment/Plan    PT Assessment Patient needs continued PT services  PT Problem List Decreased strength;Decreased mobility;Decreased activity tolerance;Cardiopulmonary status limiting activity;Decreased balance;Decreased skin integrity;Obesity       PT Treatment Interventions Functional mobility training;Neuromuscular re-education;Balance training;Therapeutic exercise;Therapeutic activities;Patient/family education;Wheelchair mobility training;DME instruction;Gait training    PT Goals (Current goals can be found in the Care Plan section)  Acute Rehab PT Goals Patient Stated Goal: to go to short term rehab PT Goal Formulation: With patient Time For Goal Achievement: 08/29/22 Potential to Achieve Goals: Fair    Frequency Min 1X/week     Co-evaluation               AM-PAC PT "6 Clicks" Mobility  Outcome Measure Help needed turning from your back to your side while in a flat bed without using bedrails?: A Lot Help needed moving from lying on your back to sitting on the side of a flat bed without using bedrails?: A Lot Help needed moving to and from a bed to a chair (including a wheelchair)?: A Lot Help needed standing up from a chair using your arms (e.g., wheelchair or bedside chair)?: A Lot  Help needed to walk in hospital room?: Total Help needed climbing 3-5 steps with a railing? : Total 6 Click Score: 10    End of Session Equipment Utilized During Treatment: Oxygen Activity Tolerance: Patient limited by fatigue Patient left: in bed;with call bell/phone within reach;with bed alarm set Nurse Communication: Mobility status;Need for lift equipment PT Visit Diagnosis: Unsteadiness on feet (R26.81);Muscle weakness (generalized) (M62.81);Difficulty in walking, not elsewhere classified (R26.2)    Time: 1610-9604 PT Time Calculation (min) (ACUTE ONLY): 20 min   Charges:   PT Evaluation $PT Eval Moderate Complexity: 1 Mod         [name], PT, DPT Acute  Rehabilitation Services  Personal: Secure Chat Rehab Office: [phone]  Malachy Chamber 08/15/2022, 12:25 PM

## 2022-08-16 DIAGNOSIS — Z515 Encounter for palliative care: Secondary | ICD-10-CM | POA: Diagnosis not present

## 2022-08-16 DIAGNOSIS — I5033 Acute on chronic diastolic (congestive) heart failure: Secondary | ICD-10-CM | POA: Diagnosis not present

## 2022-08-16 DIAGNOSIS — Z7189 Other specified counseling: Secondary | ICD-10-CM | POA: Diagnosis not present

## 2022-08-16 DIAGNOSIS — R0902 Hypoxemia: Secondary | ICD-10-CM | POA: Diagnosis not present

## 2022-08-16 DIAGNOSIS — I509 Heart failure, unspecified: Secondary | ICD-10-CM | POA: Diagnosis not present

## 2022-08-16 DIAGNOSIS — N1831 Chronic kidney disease, stage 3a: Secondary | ICD-10-CM | POA: Diagnosis not present

## 2022-08-16 DIAGNOSIS — J449 Chronic obstructive pulmonary disease, unspecified: Secondary | ICD-10-CM | POA: Diagnosis not present

## 2022-08-16 LAB — HEPATIC FUNCTION PANEL
ALT: 12 U/L (ref 0–44)
AST: 11 U/L — ABNORMAL LOW (ref 15–41)
Albumin: 2.5 g/dL — ABNORMAL LOW (ref 3.5–5.0)
Alkaline Phosphatase: 88 U/L (ref 38–126)
Bilirubin, Direct: <0.1 mg/dL (ref 0.0–0.2)
Total Bilirubin: 0.5 mg/dL (ref 0.3–1.2)
Total Protein: 7.1 g/dL (ref 6.5–8.1)

## 2022-08-16 LAB — BASIC METABOLIC PANEL
Anion gap: 7 (ref 5–15)
BUN: 16 mg/dL (ref 8–23)
CO2: 36 mmol/L — ABNORMAL HIGH (ref 22–32)
Calcium: 8.1 mg/dL — ABNORMAL LOW (ref 8.9–10.3)
Chloride: 95 mmol/L — ABNORMAL LOW (ref 98–111)
Creatinine, Ser: 1.3 mg/dL — ABNORMAL HIGH (ref 0.61–1.24)
GFR, Estimated: >60 mL/min (ref >60)
Glucose, Bld: 98 mg/dL (ref 70–99)
Potassium: 4.3 mmol/L (ref 3.5–5.1)
Sodium: 138 mmol/L (ref 135–145)

## 2022-08-16 LAB — CBC
HCT: 38.5 % — ABNORMAL LOW (ref 39.0–52.0)
Hemoglobin: 10 g/dL — ABNORMAL LOW (ref 13.0–17.0)
MCH: 21.3 pg — ABNORMAL LOW (ref 26.0–34.0)
MCHC: 26 g/dL — ABNORMAL LOW (ref 30.0–36.0)
MCV: 81.9 fL (ref 80.0–100.0)
Platelets: 191 10*3/uL (ref 150–400)
RBC: 4.7 MIL/uL (ref 4.22–5.81)
RDW: 17.8 % — ABNORMAL HIGH (ref 11.5–15.5)
WBC: 6.9 10*3/uL (ref 4.0–10.5)
nRBC: 0.3 % — ABNORMAL HIGH (ref 0.0–0.2)

## 2022-08-16 MED ORDER — ORAL CARE MOUTH RINSE: 15.0000 mL | OROMUCOSAL | Status: DC | PRN

## 2022-08-16 MED ORDER — ORAL CARE MOUTH RINSE
15.0000 mL | OROMUCOSAL | Status: DC
  Administered 2022-08-16 – 2022-08-21 (×19): 15 mL via OROMUCOSAL

## 2022-08-16 NOTE — TOC Progression Note (Signed)
Transition of Care [facility]) - Progression Note    Patient Details  Name: Gary [name] MRN: [id number] Date of Birth: May 01, 1955  [facility]) CM/SW Contact  Elliot Cousin, RN Phone Number: [phone] 08/16/2022, 12:20 PM  Clinical Narrative:   CM referral to LTAC, contacted [employer] rep, Victorino Dike. She will review referral.     Expected Discharge Plan: Skilled Nursing Facility Barriers to Discharge: Continued Medical Work up  Expected Discharge Plan and Services   Discharge Planning Services: CM Consult Post Acute Care Choice: Skilled Nursing Facility Living arrangements for the past 2 months: Apartment                                       Social Determinants of Health (SDOH) Interventions SDOH Screenings   Food Insecurity: No Food Insecurity (08/13/2022)  Housing: Low Risk  (08/13/2022)  Transportation Needs: Unmet Transportation Needs (08/13/2022)  Utilities: Not At Risk (08/13/2022)  Depression (PHQ2-9): Low Risk  (06/07/2021)  Financial Resource Strain: Medium Risk (05/03/2017)  Physical Activity: Insufficiently Active (05/03/2017)  Social Connections: Moderately Integrated (05/03/2017)  Stress: No Stress Concern Present (05/03/2017)  Tobacco Use: Medium Risk (04/26/2022)    Readmission Risk Interventions    12/31/2021    8:58 AM 12/20/2021    4:21 PM 05/04/2021    1:26 PM  Readmission Risk Prevention Plan  Transportation Screening Complete Complete Complete  Medication Review Oceanographer) Complete Complete Complete  PCP or Specialist appointment within 3-5 days of discharge Complete Complete Complete  HRI or Home Care Consult Complete Complete Complete  SW Recovery Care/Counseling Consult Complete Complete Complete  Palliative Care Screening Complete Complete Not Applicable  Skilled Nursing Facility Not Applicable Not Applicable Not Applicable
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
## 2022-08-16 NOTE — Progress Notes (Signed)
[facility]                                                                              Gary [name], is a 67 y.o. male, DOB - 06-Sep-1955, ZOX:[id number] Admit date - 08/12/2022    Outpatient Primary MD for the patient is [facility]  LOS - 3  days  Chief Complaint  Patient presents with   Shortness of Breath       Brief summary   Patient is a 67 year old male with chronic respiratory failure + tracheotomy (Shiley XLT size 7 uncuffed) since 10/03/2021 --lengthy hospitalization, 09/19/2021-12/13/2021 admission at [facility] (Acute respiratory failure, Vap, AKI with hyperkalemia, melena with active GI bleed proximal sigmoid colon + embolization by IR, multiple transfusions etc. etc.-see discharge summary dated 12/13/2021) uses nocturnal vent currently, COPD--OSA-now on nocturnal vent, prior GI bleed requiring IR intervention as above, hypertension, CKD stage IIIb, morbid obesity, multiple prolonged hospitalizations over the year.   Admitted 04/26/2022-07/25/2022 with acute on chronic respiratory failure, was diuresed, hospitalization was complicated by difficult to place, trach and qhs vent.  He is on trach collar during the day 25 to 30% on vent at night.  Patient presented to ED with increasing shortness of breath.  He was on torsemide 20 mg daily, reported increasing dyspnea, bilateral lower extremity edema and weight gain.  Also reported increased oxygen requirement.  Stated that he was seen in the ED in [place] a few days ago and was placed on antibiotic for pneumonia, which he never picked up from pharmacy.  Also had substernal chest pain for several days.  Patient was requiring 10 L supplemental O2, 50% FiO2 via trach to maintain sats in 90s, afebrile.  Hemoglobin 9.9, no leukocytosis.  COVID influenza, RSV PCR negative.  Chest x-ray concerning for pulmonary edema.  PCCM and cardiology was consulted     Assessment & Plan    Principal Problem:   Acute on  chronic heart failure with preserved ejection fraction (HFpEF) ([name]), dyspnea multifactorial -Cardiology was consulted.  2D echo showed EF of 65%, no evidence of PAH. -Patient was started on IV Lasix 60 mg twice daily and Diamox was added 500 mg twice daily (outpatient on torsemide 20 mg daily) -Currently tolerating Lasix 40 mg IV twice daily, creatinine stable at 1.3, will continue IV Lasix.   -Negative balance of 5.5 L.  Weight 415lbs on admission-> 401.6 today -Added midodrine for BP to allow effective diuresis  -CHF team following.  Active Problems:  Acute on chronic respiratory failure with hypoxia, trach/vent dependent, OSA, OHS -Trach collar during the day and on nighttime vent. -Respiratory virus panel negative. -CCM following, trach and vent management per CCM  -Patient will remain in ICU for his ventilator requirements. -Palliative medicine was consulted for GOC, remains full code    OSA (obstructive sleep apnea), COPD -Vent dependent at night, quit smoking several years ago -Continue vent adjustments per CCM    Chronic kidney disease, stage 3a ([name]) -Creatinine stable at 1.3, continue IV Lasix 40 mg twice daily  -Follow renal function.   -Received Diamox 500 mg twice daily for 2 doses on 4/30  Depression -Continue Lexapro, hydroxyzine  Chronic DVT -Continue eliquis  Goals of care 08/16/22 Patient with chronic respiratory failure with tracheostomy and nocturnal vent, diastolic CHF, OSA, morbid obesity, COPD, CKD3a.  Tried goals of care with the patient today with palliative medicine, patient with flat affect and did not engage.  Given his recurrent hospitalizations and medical status, he would best benefit from from being in LTAC. Palliative medicine was able to reach his sister, remains full code and agreeable for LTAC. -TOC notified   Morbid obesity Estimated body mass index is 50.21 kg/m as calculated from the following:   Height as of this encounter: 6\' 3"  (1.905  m).   Weight as of this encounter: 182.2 kg.  Code Status: Full code DVT Prophylaxis:  apixaban (ELIQUIS) tablet 2.5 mg Start: 08/15/22 1000 apixaban (ELIQUIS) tablet 2.5 mg   Level of Care: Level of care: ICU Family Communication: No family at bedside Disposition Plan:      Remains inpatient appropriate: TOC requested to look into LTAC for disposition   Procedures:  2D echo  Consultants:   CCM Cardiology  Antimicrobials:   Medications  apixaban  2.5 mg Oral BID   Chlorhexidine Gluconate Cloth  6 each Topical Daily   escitalopram  5 mg Oral Daily   ferrous sulfate  325 mg Oral Q breakfast   furosemide  40 mg Intravenous BID   midodrine  5 mg Oral TID WC   mouth rinse  15 mL Mouth Rinse 4 times per day   pantoprazole  40 mg Oral BID AC   potassium chloride  40 mEq Oral Daily   sodium chloride flush  10-40 mL Intracatheter Q12H      Subjective:   Gary [name] was seen and examined today.  Seen this morning, resting comfortably in no distress.  Initially patient answered some yes and no questions about his symptoms (ROS), subsequently had a flat affect completely with no response.  No acute issues overnight.  Tolerating regular diet.   Objective:   Vitals:   08/16/22 0600 08/16/22 0700 08/16/22 0740 08/16/22 1057  BP: 111/64 109/62    Pulse: 86 89 84   Resp: (!) 42 (!) 26 (!) 24   Temp:    98.7 F (37.1 C)  TempSrc:    Oral  SpO2: 95% 96% 95%   Weight:      Height:        Intake/Output Summary (Last 24 hours) at 08/16/2022 1131 Last data filed at 08/16/2022 0600 Gross per 24 hour  Intake 1060 ml  Output 1950 ml  Net -890 ml     Wt Readings from Last 3 Encounters:  08/16/22 (!) 182.2 kg  04/14/22 (!) 160.4 kg  04/06/22 (!) 193.2 kg   Physical Exam General: Alert and awake, initially responded yes and no to the symptom questions, trach+ Cardiovascular: S1 S2 clear, RRR.  Respiratory: CTAB anteriorly Gastrointestinal: Soft, nontender, nondistended,  NBS Ext: no pedal edema bilaterally Neuro:  Psych: flat affect    Data Reviewed:  I have personally reviewed following labs    CBC Lab Results  Component Value Date   WBC 6.9 08/16/2022   RBC 4.70 08/16/2022   HGB 10.0 (L) 08/16/2022   HCT 38.5 (L) 08/16/2022   MCV 81.9 08/16/2022   MCH 21.3 (L) 08/16/2022   PLT 191 08/16/2022   MCHC 26.0 (L) 08/16/2022   RDW 17.8 (H) 08/16/2022   LYMPHSABS 1.2 08/15/2022   MONOABS 0.8 08/15/2022   EOSABS 0.1 08/15/2022  BASOSABS 0.0 08/15/2022     Last metabolic panel Lab Results  Component Value Date   NA 138 08/16/2022   K 4.3 08/16/2022   CL 95 (L) 08/16/2022   CO2 36 (H) 08/16/2022   BUN 16 08/16/2022   CREATININE 1.30 (H) 08/16/2022   GLUCOSE 98 08/16/2022   GFRNONAA >60 08/16/2022   GFRAA 91 03/15/2020   CALCIUM 8.1 (L) 08/16/2022   PHOS 4.1 08/14/2022   PROT 7.1 08/16/2022   ALBUMIN 2.5 (L) 08/16/2022   BILITOT 0.5 08/16/2022   ALKPHOS 88 08/16/2022   AST 11 (L) 08/16/2022   ALT 12 08/16/2022   ANIONGAP 7 08/16/2022    CBG (last 3)  No results for input(s): "GLUCAP" in the last 72 hours.    Coagulation Profile: No results for input(s): "INR", "PROTIME" in the last 168 hours.   Radiology Studies: I have personally reviewed the imaging studies  No results found.     Thad Ranger M.D. [facility] 08/16/2022, 11:31 AM  Available via Epic secure chat 7am-7pm After 7 pm, please refer to night coverage provider listed on amion.

## 2022-08-16 NOTE — Progress Notes (Signed)
Physical Therapy Treatment Patient Details Name: Gary [name] MRN: [id number] DOB: 30-May-1955 Today's Date: 08/16/2022   History of Present Illness Pt is a 67 y.o. male admitted 08/12/22 with SOB; workup for acute on chronic respiratory failure, HFpEF, OSA. Of note, multiple recent admissions, including prolonged admission (04/2022-07/2022) with acute bronchitis/COPD exacerbation, ultimately d/c to SNF; recent ED visit 08/07/22 with concern for PNA, pt never picked up antibiotic. Other PMH includes chronic respiratory failure s/p trach (2023), HFpEF, COPD, HTN, Hep C, DDD, OSA, obesity, CKD3, anemia, anxiety, depression.    PT Comments    Patient needs encouragement to participate with PT today. Frequent rest breaks required with all mobility today with increased time needed to complete tasks. He complains of dizziness and generalized weakness. The patient was able to sit up on the side of bed with assistance with sitting tolerance of less than 5 minutes despite encouragement. He complains of dizziness with upright activity with MAP in the mid 80's. Sp02 remained at 95% or higher with Fi02 at 70%, but does briefly decrease to upper 70's when Fi02 placed on 55% with initial preparation for OOB mobility. Patient fatigued today with minimal activity. Recommend to continue PT to maximize independence and decrease caregiver burden.    Recommendations for follow up therapy are one component of a multi-disciplinary discharge planning process, led by the attending physician.  Recommendations may be updated based on patient status, additional functional criteria and insurance authorization.  Follow Up Recommendations  Can patient physically be transported by private vehicle: No    Assistance Recommended at Discharge Frequent or constant Supervision/Assistance  Patient can return home with the following A lot of help with walking and/or transfers;A lot of help with bathing/dressing/bathroom;Assistance with  cooking/housework;Assist for transportation;Help with stairs or ramp for entrance   Equipment Recommendations   (to be determined)    Recommendations for Other Services       Precautions / Restrictions Precautions Precautions: Fall Precaution Comments: trach collar, PMSV Restrictions Weight Bearing Restrictions: No     Mobility  Bed Mobility Overal bed mobility: Needs Assistance Bed Mobility: Supine to Sit, Sit to Supine Rolling: Min assist   Supine to sit: Max assist, +2 for physical assistance Sit to supine: Max assist, +2 for physical assistance   General bed mobility comments: cues for sequencing with occasional difficulty/increased time for processing. multiple rest breaks required initially with sitting upright. patient complains of dizziness, fatigue, weakness with minimal activity today    Transfers                   General transfer comment: patient declined to attempt due to generalized weakness and dizziness with sitting. limtied sitting tolerance despite encouragement    Ambulation/Gait                   Stairs             Wheelchair Mobility    Modified Rankin (Stroke Patients Only)       Balance Overall balance assessment: Needs assistance Sitting-balance support: Feet supported Sitting balance-[name] Scale: Fair Sitting balance - Comments: patient does prop on the right elbow intermittently for support, likely related to generalized weakness and fatigue with minimal activity. he can maintain midline for brief periods without UE support. total sitting time less than 5 minutes  Cognition Arousal/Alertness:  (awake with periods of lethargy/flat affect) Behavior During Therapy: Flat affect Overall Cognitive Status: Impaired/Different from baseline Area of Impairment: Following commands, Problem solving                       Following Commands: Follows one step commands  with increased time (and repetition occasionally)     Problem Solving: Slow processing, Decreased initiation, Difficulty sequencing, Requires verbal cues, Requires tactile cues General Comments: more difficulty with problem solving today. needs encouragement. increased time required with all mobility tasks. patient rarely making eye contact today, seems withdrawn at times.        Exercises Other Exercises Other Exercises: encouraged patient to perform UE and LE home exercise program. he reports difficulty with basic exercises secondary to weakness    General Comments General comments (skin integrity, edema, etc.): Sp02 in the 90's while on trach collar with Fi02 at 70%. when Fi02 decreased to 55% in preparation for OOB activity, Sp02 does decrease to the 70's briefly. blood pressure 105/68 before mobility, 98/79 with sitting upright (MAP in the 80's). dizziness reported throughout session      Pertinent Vitals/Pain Pain Assessment Pain Assessment: Faces Faces Pain Scale: Hurts a little bit Pain Location: L knee with movement Pain Descriptors / Indicators: Discomfort Pain Intervention(s): Monitored during session, Limited activity within patient's tolerance, Repositioned    Home Living                          Prior Function            PT Goals (current goals can now be found in the care plan section) Acute Rehab PT Goals Patient Stated Goal: to be able to walk again PT Goal Formulation: With patient Time For Goal Achievement: 08/29/22 Potential to Achieve Goals: Fair Progress towards PT goals: Progressing toward goals    Frequency    Min 1X/week      PT Plan Current plan remains appropriate    Co-evaluation              AM-PAC PT "6 Clicks" Mobility   Outcome Measure  Help needed turning from your back to your side while in a flat bed without using bedrails?: A Lot Help needed moving from lying on your back to sitting on the side of a flat bed  without using bedrails?: A Lot Help needed moving to and from a bed to a chair (including a wheelchair)?: A Lot Help needed standing up from a chair using your arms (e.g., wheelchair or bedside chair)?: A Lot Help needed to walk in hospital room?: Total Help needed climbing 3-5 steps with a railing? : Total 6 Click Score: 10    End of Session Equipment Utilized During Treatment: Oxygen Activity Tolerance: Patient limited by fatigue Patient left: in bed;with call bell/phone within reach (set-up with lunch tray) Nurse Communication: Mobility status PT Visit Diagnosis: Unsteadiness on feet (R26.81);Muscle weakness (generalized) (M62.81);Difficulty in walking, not elsewhere classified (R26.2)     Time: 1610-9604 PT Time Calculation (min) (ACUTE ONLY): 38 min  Charges:  $Therapeutic Activity: 38-52 mins                    [name], PT, MPT   [name] 08/16/2022, 12:08 PM

## 2022-08-17 DIAGNOSIS — I509 Heart failure, unspecified: Secondary | ICD-10-CM | POA: Diagnosis not present

## 2022-08-17 DIAGNOSIS — G4733 Obstructive sleep apnea (adult) (pediatric): Secondary | ICD-10-CM

## 2022-08-17 DIAGNOSIS — I5033 Acute on chronic diastolic (congestive) heart failure: Secondary | ICD-10-CM | POA: Diagnosis not present

## 2022-08-17 DIAGNOSIS — R0902 Hypoxemia: Secondary | ICD-10-CM | POA: Diagnosis not present

## 2022-08-17 LAB — CBC
HCT: 38.3 % — ABNORMAL LOW (ref 39.0–52.0)
Hemoglobin: 9.9 g/dL — ABNORMAL LOW (ref 13.0–17.0)
MCH: 21.2 pg — ABNORMAL LOW (ref 26.0–34.0)
MCHC: 25.8 g/dL — ABNORMAL LOW (ref 30.0–36.0)
MCV: 82.2 fL (ref 80.0–100.0)
Platelets: 164 10*3/uL (ref 150–400)
RBC: 4.66 MIL/uL (ref 4.22–5.81)
RDW: 17.5 % — ABNORMAL HIGH (ref 11.5–15.5)
WBC: 6 10*3/uL (ref 4.0–10.5)
nRBC: 0.3 % — ABNORMAL HIGH (ref 0.0–0.2)

## 2022-08-17 LAB — BASIC METABOLIC PANEL
Anion gap: 8 (ref 5–15)
BUN: 18 mg/dL (ref 8–23)
CO2: 34 mmol/L — ABNORMAL HIGH (ref 22–32)
Calcium: 8.3 mg/dL — ABNORMAL LOW (ref 8.9–10.3)
Chloride: 95 mmol/L — ABNORMAL LOW (ref 98–111)
Creatinine, Ser: 1.25 mg/dL — ABNORMAL HIGH (ref 0.61–1.24)
GFR, Estimated: >60 mL/min (ref >60)
Glucose, Bld: 80 mg/dL (ref 70–99)
Potassium: 4.1 mmol/L (ref 3.5–5.1)
Sodium: 137 mmol/L (ref 135–145)

## 2022-08-17 MED ORDER — DOCUSATE SODIUM 100 MG PO CAPS
100.0000 mg | ORAL_CAPSULE | Freq: Every day | ORAL | Status: DC
  Administered 2022-08-17 – 2022-08-19 (×3): 100 mg via ORAL
  Filled 2022-08-17 (×4): qty 1

## 2022-08-17 NOTE — TOC Progression Note (Signed)
Transition of Care [facility]) - Progression Note    Patient Details  Name: AIRAM RAIFSNIDER MRN: [id number] Date of Birth: 24-Dec-1955  Transition of Care [facility]) CM/SW Contact  Elliot Cousin, RN Phone Number: [phone] 08/17/2022, 2:56 PM  Clinical Narrative:   [facility] TOC CM spoke to pt and states he is agreeable to referral to [facility]. Contacted Alden Server, [facility] rep and pt meets criteria. States they do not have any beds available. Will follow up with TOC CM/CSW on weekend if bed becomes available. CM updated attending.     Expected Discharge Plan: Long Term Acute Care (LTAC) Barriers to Discharge: Continued Medical Work up  Expected Discharge Plan and Services   Discharge Planning Services: CM Consult Post Acute Care Choice: Long Term Acute Care (LTAC) Living arrangements for the past 2 months: Apartment   Social Determinants of Health (SDOH) Interventions SDOH Screenings   Food Insecurity: No Food Insecurity (08/13/2022)  Housing: Low Risk  (08/13/2022)  Transportation Needs: Unmet Transportation Needs (08/13/2022)  Utilities: Not At Risk (08/13/2022)  Depression (PHQ2-9): Low Risk  (06/07/2021)  Financial Resource Strain: Medium Risk (05/03/2017)  Physical Activity: Insufficiently Active (05/03/2017)  Social Connections: Moderately Integrated (05/03/2017)  Stress: No Stress Concern Present (05/03/2017)  Tobacco Use: Medium Risk (04/26/2022)    Readmission Risk Interventions    12/31/2021    8:58 AM 12/20/2021    4:21 PM 05/04/2021    1:26 PM  Readmission Risk Prevention Plan  Transportation Screening Complete Complete Complete  Medication Review Oceanographer) Complete Complete Complete  PCP or Specialist appointment within 3-5 days of discharge Complete Complete Complete  HRI or Home Care Consult Complete Complete Complete  SW Recovery Care/Counseling Consult Complete Complete Complete  Palliative Care Screening Complete Complete Not Applicable  Skilled Nursing Facility  Not Applicable Not Applicable Not Applicable

## 2022-08-17 NOTE — Progress Notes (Signed)
[facility]                                                                              Gary [name], is a 67 y.o. male, DOB - Mar 30, 1956, [id number] Admit date - 08/12/2022    Outpatient Primary MD for the patient is [facility]  LOS - 4  days  Chief Complaint  Patient presents with   Shortness of Breath       Brief summary   Patient is a 67 year old male with chronic respiratory failure + tracheotomy (Shiley XLT size 7 uncuffed) since 10/03/2021 --lengthy hospitalization, 09/19/2021-12/13/2021 admission at [facility] (Acute respiratory failure, Vap, AKI with hyperkalemia, melena with active GI bleed proximal sigmoid colon + embolization by IR, multiple transfusions etc. etc.-see discharge summary dated 12/13/2021) uses nocturnal vent currently, COPD--OSA-now on nocturnal vent, prior GI bleed requiring IR intervention as above, hypertension, CKD stage IIIb, morbid obesity, multiple prolonged hospitalizations over the year.   Admitted 04/26/2022-07/25/2022 with acute on chronic respiratory failure, was diuresed, hospitalization was complicated by difficult to place, trach and qhs vent.  He is on trach collar during the day 25 to 30% on vent at night.  Patient presented to ED with increasing shortness of breath.  He was on torsemide 20 mg daily, reported increasing dyspnea, bilateral lower extremity edema and weight gain.  Also reported increased oxygen requirement.  Stated that he was seen in the ED in [place] a few days ago and was placed on antibiotic for pneumonia, which he never picked up from pharmacy.  Also had substernal chest pain for several days.  Patient was requiring 10 L supplemental O2, 50% FiO2 via trach to maintain sats in 90s, afebrile.  Hemoglobin 9.9, no leukocytosis.  COVID influenza, RSV PCR negative.  Chest x-ray concerning for pulmonary edema.  PCCM and cardiology was consulted     Assessment & Plan    Principal Problem:   Acute on  chronic heart failure with preserved ejection fraction (HFpEF) ([name]), dyspnea multifactorial -Cardiology was consulted.  2D echo showed EF of 65%, no evidence of PAH. -Patient was started on IV Lasix 60 mg twice daily and Diamox was added 500 mg twice daily (outpatient on torsemide 20 mg daily) -Currently tolerating Lasix 40 mg IV twice daily, creatinine stable at 1.3, will continue IV Lasix.   -Negative balance of 6.6 L.  Weight 415lbs on admission-> 393lbs  -Will continue midodrine for now to allow maximal diuresis as tolerated with BP -Difficult to assess volume with his body habitus  Active Problems:  Acute on chronic respiratory failure with hypoxia, trach/vent dependent, OSA, OHS -Trach collar during the day and on nighttime vent. -Respiratory virus panel negative. -CCM following, trach and vent management per CCM  -Patient will remain in ICU for his ventilator requirements. -Palliative medicine was consulted for GOC, remains full code    OSA (obstructive sleep apnea), COPD -Vent dependent at night, quit smoking several years ago -Continue vent adjustments per CCM    Chronic kidney disease, stage 3a ([name]) -Creatinine stable at 1.2, continue IV Lasix 40 mg q12h -Received Diamox 500 mg twice daily for 2 doses on  4/30 - currently CO2 stable 34, if increasing, will give another dose of Diamox  Depression -Continue Lexapro, hydroxyzine  Chronic DVT -Continue eliquis  Goals of care 08/17/22 Patient with chronic respiratory failure with tracheostomy and nocturnal vent, diastolic CHF, OSA, morbid obesity, COPD, CKD3a.  - he would best benefit from from being in LTAC. -Discussed in detail with the patient and explained LTAC, he is fully agreeable with the plan. Palliative medicine was able to reach his sister, remains full code and agreeable for LTAC. -TOC notified, currently no beds available at [facility] today, DC when bed available   Morbid obesity Estimated body mass index is 49.24  kg/m as calculated from the following:   Height as of this encounter: 6\' 3"  (1.905 m).   Weight as of this encounter: 178.7 kg.  Code Status: Full code DVT Prophylaxis:  Place TED hose Start: 08/17/22 0746 apixaban (ELIQUIS) tablet 2.5 mg Start: 08/15/22 1000 apixaban (ELIQUIS) tablet 2.5 mg   Level of Care: Level of care: ICU Family Communication: No family at bedside Disposition Plan:      Remains inpatient appropriate: TOC requested to look into LTAC for disposition, no beds available today, hopefully over the weekend   Procedures:  2D echo  Consultants:   CCM Cardiology  Antimicrobials:   Medications  apixaban  2.5 mg Oral BID   Chlorhexidine Gluconate Cloth  6 each Topical Daily   docusate sodium  100 mg Oral Daily   escitalopram  5 mg Oral Daily   ferrous sulfate  325 mg Oral Q breakfast   furosemide  40 mg Intravenous BID   midodrine  5 mg Oral TID WC   mouth rinse  15 mL Mouth Rinse 4 times per day   pantoprazole  40 mg Oral BID AC   potassium chloride  40 mEq Oral Daily   sodium chloride flush  10-40 mL Intracatheter Q12H      Subjective:   Gary [name] was seen and examined today.  Patient much more alert and oriented, conversant.  Does not like the bed and requested a different bed.  He feels he is still fluid overloaded however improving with IV Lasix.  He is fully on plan with LTAC and feels he will [facility] much better at [facility] than at home.  Tolerating regular diet.   Objective:   Vitals:   08/17/22 1100 08/17/22 1126 08/17/22 1200 08/17/22 1300  BP: (!) 123/93  96/77 106/69  Pulse: 96 92 93 83  Resp: (!) 24 16 (!) 29 (!) 26  Temp:      TempSrc:      SpO2: 97% 96% 91% 90%  Weight:      Height:        Intake/Output Summary (Last 24 hours) at 08/17/2022 1459 Last data filed at 08/17/2022 1000 Gross per 24 hour  Intake 1330 ml  Output 2700 ml  Net -1370 ml     Wt Readings from Last 3 Encounters:  08/17/22 (!) 178.7 kg  04/14/22 (!) 160.4 kg   04/06/22 (!) 193.2 kg   Physical Exam General: Alert and oriented x 3, NAD, trach+ Cardiovascular: S1 S2 clear, RRR.  Respiratory: CTAB anteriorly Gastrointestinal: Obese, soft, NT, ND, NBS Ext: + pedal edema bilaterally Neuro: no new deficits Psych: Normal affect     Data Reviewed:  I have personally reviewed following labs    CBC Lab Results  Component Value Date   WBC 6.0 08/17/2022   RBC 4.66 08/17/2022   HGB 9.9 (L)  08/17/2022   HCT 38.3 (L) 08/17/2022   MCV 82.2 08/17/2022   MCH 21.2 (L) 08/17/2022   PLT 164 08/17/2022   MCHC 25.8 (L) 08/17/2022   RDW 17.5 (H) 08/17/2022   LYMPHSABS 1.2 08/15/2022   MONOABS 0.8 08/15/2022   EOSABS 0.1 08/15/2022   BASOSABS 0.0 08/15/2022     Last metabolic panel Lab Results  Component Value Date   NA 137 08/17/2022   K 4.1 08/17/2022   CL 95 (L) 08/17/2022   CO2 34 (H) 08/17/2022   BUN 18 08/17/2022   CREATININE 1.25 (H) 08/17/2022   GLUCOSE 80 08/17/2022   GFRNONAA >60 08/17/2022   GFRAA 91 03/15/2020   CALCIUM 8.3 (L) 08/17/2022   PHOS 4.1 08/14/2022   PROT 7.1 08/16/2022   ALBUMIN 2.5 (L) 08/16/2022   BILITOT 0.5 08/16/2022   ALKPHOS 88 08/16/2022   AST 11 (L) 08/16/2022   ALT 12 08/16/2022   ANIONGAP 8 08/17/2022    CBG (last 3)  No results for input(s): "GLUCAP" in the last 72 hours.    Coagulation Profile: No results for input(s): "INR", "PROTIME" in the last 168 hours.   Radiology Studies: I have personally reviewed the imaging studies  No results found.     Thad Ranger M.D. [facility] 08/17/2022, 2:59 PM  Available via Epic secure chat 7am-7pm After 7 pm, please refer to night coverage provider listed on amion.

## 2022-08-17 NOTE — Progress Notes (Signed)
   08/16/22 2359  Therapy Vitals  Pulse Rate 83  Resp 20  BP 107/70  Patient Position (if appropriate) Lying  MEWS Score/Color  MEWS Score 0  MEWS Score Color Green  Respiratory Assessment  Assessment Type Assess only  Respiratory Pattern Regular;Unlabored  Chest Assessment Chest expansion symmetrical  Cough Productive;Congested  Sputum Amount Moderate  Sputum Color White  Sputum Consistency Thick  Sputum Specimen Source Tracheostomy tube  Bilateral Breath Sounds Rhonchi;Diminished  Oxygen Therapy/Pulse Ox  O2 Device Tracheostomy Collar  O2 Therapy Oxygen humidified  O2 Flow Rate (L/min) 10 L/min  SpO2 94 %  Oxygen On/Off  Continued  Tracheostomy Shiley XLT Proximal 7 mm Uncuffed  Placement Date/Time: 08/12/22 1815   Inserted prior to hospital arrival?: Placed at another facility  Brand: Shiley XLT Proximal  Size (mm): 7 mm  Style: Uncuffed  Status (S)  Secured with trach ties;Passy Muir Speaking valve;Other (Comment) (removed passy muir for the night)  Site Assessment Clean;Dry  Site Care Cleansed;Dried;Dressing applied   Pt did not want to be placed back on vent for QHS rest, pt stated that he does not have hm vent and family will bring tomorrow

## 2022-08-18 DIAGNOSIS — I5033 Acute on chronic diastolic (congestive) heart failure: Secondary | ICD-10-CM | POA: Diagnosis not present

## 2022-08-18 DIAGNOSIS — N1831 Chronic kidney disease, stage 3a: Secondary | ICD-10-CM | POA: Diagnosis not present

## 2022-08-18 DIAGNOSIS — R0902 Hypoxemia: Secondary | ICD-10-CM | POA: Diagnosis not present

## 2022-08-18 DIAGNOSIS — I509 Heart failure, unspecified: Secondary | ICD-10-CM | POA: Diagnosis not present

## 2022-08-18 LAB — BASIC METABOLIC PANEL
Anion gap: 5 (ref 5–15)
BUN: 25 mg/dL — ABNORMAL HIGH (ref 8–23)
CO2: 37 mmol/L — ABNORMAL HIGH (ref 22–32)
Calcium: 8.6 mg/dL — ABNORMAL LOW (ref 8.9–10.3)
Chloride: 96 mmol/L — ABNORMAL LOW (ref 98–111)
Creatinine, Ser: 1.45 mg/dL — ABNORMAL HIGH (ref 0.61–1.24)
GFR, Estimated: 53 mL/min — ABNORMAL LOW (ref >60)
Glucose, Bld: 91 mg/dL (ref 70–99)
Potassium: 4 mmol/L (ref 3.5–5.1)
Sodium: 138 mmol/L (ref 135–145)

## 2022-08-18 MED ORDER — MIDODRINE HCL 5 MG PO TABS
10.0000 mg | ORAL_TABLET | Freq: Three times a day (TID) | ORAL | Status: DC
  Administered 2022-08-18 – 2022-08-21 (×9): 10 mg via ORAL
  Filled 2022-08-18 (×9): qty 2

## 2022-08-18 MED ORDER — FUROSEMIDE 10 MG/ML IJ SOLN: 40.0000 mg | Freq: Every day | INTRAMUSCULAR | Status: DC

## 2022-08-18 MED ORDER — ACETAZOLAMIDE 250 MG PO TABS
250.0000 mg | ORAL_TABLET | Freq: Two times a day (BID) | ORAL | Status: AC
  Administered 2022-08-18 (×2): 250 mg via ORAL
  Filled 2022-08-18 (×2): qty 1

## 2022-08-18 MED ORDER — MIDODRINE HCL 5 MG PO TABS: 10.0000 mg | ORAL_TABLET | Freq: Three times a day (TID) | ORAL | Status: DC

## 2022-08-18 MED ORDER — ACETAZOLAMIDE ER 500 MG PO CP12: 500.0000 mg | ORAL_CAPSULE | Freq: Two times a day (BID) | ORAL | Status: DC

## 2022-08-18 NOTE — Progress Notes (Signed)
[facility]                                                                              Daronte Thunstrom, is a 67 y.o. male, DOB - 03-May-1955, [id number] Admit date - 08/12/2022    Outpatient Primary MD for the patient is [facility]  LOS - 5  days  Chief Complaint  Patient presents with   Shortness of Breath       Brief summary   Patient is a 67 year old male with chronic respiratory failure + tracheotomy (Shiley XLT size 7 uncuffed) since 10/03/2021 --lengthy hospitalization, 09/19/2021-12/13/2021 admission at [facility] (Acute respiratory failure, Vap, AKI with hyperkalemia, melena with active GI bleed proximal sigmoid colon + embolization by IR, multiple transfusions etc. etc.-see discharge summary dated 12/13/2021) uses nocturnal vent currently, COPD--OSA-now on nocturnal vent, prior GI bleed requiring IR intervention as above, hypertension, CKD stage IIIb, morbid obesity, multiple prolonged hospitalizations over the year.   Admitted 04/26/2022-07/25/2022 with acute on chronic respiratory failure, was diuresed, hospitalization was complicated by difficult to place, trach and qhs vent.  He is on trach collar during the day 25 to 30% on vent at night.  Patient presented to ED with increasing shortness of breath.  He was on torsemide 20 mg daily, reported increasing dyspnea, bilateral lower extremity edema and weight gain.  Also reported increased oxygen requirement.  Stated that he was seen in the ED in [place] a few days ago and was placed on antibiotic for pneumonia, which he never picked up from pharmacy.  Also had substernal chest pain for several days.  Patient was requiring 10 L supplemental O2, 50% FiO2 via trach to maintain sats in 90s, afebrile.  Hemoglobin 9.9, no leukocytosis.  COVID influenza, RSV PCR negative.  Chest x-ray concerning for pulmonary edema.  PCCM and cardiology was consulted     Assessment & Plan    Principal Problem:   Acute on  chronic heart failure with preserved ejection fraction (HFpEF) ([name]), dyspnea multifactorial -Cardiology was consulted.  2D echo showed EF of 65%, no evidence of PAH. -Patient was started on IV Lasix 60 mg twice daily and Diamox was added 500 mg twice daily (outpatient on torsemide 20 mg daily) -Continue IV Lasix 40 mg twice daily, negative balance of 7.7 L. -Creatinine trended up to 1.4 today, BP soft, decrease Lasix to 40 mg IV daily, - CO2 trending up, will give Diamox 500 mg twice daily x2 - Weight 415lbs on admission-> 393lbs on 5/3-> 407 today -Will continue midodrine for now to allow maximal diuresis as tolerated with BP -Difficult to assess volume with his body habitus  Active Problems:  Acute on chronic respiratory failure with hypoxia, trach/vent dependent, OSA, OHS -Trach collar during the day and on nighttime vent. -Respiratory virus panel negative. -CCM following, trach and vent management per CCM  -Patient will remain in ICU for his ventilator requirements. -Palliative medicine was consulted for GOC, remains full code    OSA (obstructive sleep apnea), COPD -Vent dependent at night, quit smoking several years ago -Continue vent adjustments per CCM    Chronic kidney disease, stage 3a ([name]) -Received  Diamox 500 mg twice daily for 2 doses on 4/30 -CO2 trending up, will give Diamox x2 -Creatinine trending up, 1.4, decrease Lasix to 40 mg IV daily  Depression -Continue Lexapro, hydroxyzine  Chronic DVT -Continue eliquis  Goals of care Patient with chronic respiratory failure with tracheostomy and nocturnal vent, diastolic CHF, OSA, morbid obesity, COPD, CKD3a.  - he would best benefit from from being in LTAC. -Discussed in detail with the patient and explained LTAC, he is fully agreeable with the plan. Palliative medicine was able to reach his sister, remains full code and agreeable for LTAC. -TOC notified, currently no beds available,  DC when bed available   Morbid  obesity Estimated body mass index is 50.92 kg/m as calculated from the following:   Height as of this encounter: 6\' 3"  (1.905 m).   Weight as of this encounter: 184.8 kg.  Code Status: Full code DVT Prophylaxis:  Place TED hose Start: 08/17/22 0746 apixaban (ELIQUIS) tablet 2.5 mg Start: 08/15/22 1000 apixaban (ELIQUIS) tablet 2.5 mg   Level of Care: Level of care: ICU Family Communication: No family at bedside Disposition Plan:      Remains inpatient appropriate: TOC requested to look into LTAC for disposition.   Procedures:  2D echo  Consultants:   CCM Cardiology  Antimicrobials:   Medications  apixaban  2.5 mg Oral BID   Chlorhexidine Gluconate Cloth  6 each Topical Daily   docusate sodium  100 mg Oral Daily   escitalopram  5 mg Oral Daily   ferrous sulfate  325 mg Oral Q breakfast   furosemide  40 mg Intravenous BID   midodrine  5 mg Oral TID WC   mouth rinse  15 mL Mouth Rinse 4 times per day   pantoprazole  40 mg Oral BID AC   potassium chloride  40 mEq Oral Daily   sodium chloride flush  10-40 mL Intracatheter Q12H      Subjective:   Gary [name] was seen and examined today.  Tired, states did not sleep well last night.  No acute complaints.    Objective:   Vitals:   08/18/22 0800 08/18/22 0900 08/18/22 1000 08/18/22 1104  BP: (!) 79/58 (!) 90/59 (!) 89/75   Pulse: 74 72 80   Resp: (!) 25 (!) 24 (!) 27   Temp:    97.6 F (36.4 C)  TempSrc:    Oral  SpO2: 92% 92% 92%   Weight:      Height:        Intake/Output Summary (Last 24 hours) at 08/18/2022 1229 Last data filed at 08/18/2022 0500 Gross per 24 hour  Intake 300 ml  Output 1400 ml  Net -1100 ml     Wt Readings from Last 3 Encounters:  08/18/22 (!) 184.8 kg  04/14/22 (!) 160.4 kg  04/06/22 (!) 193.2 kg    Physical Exam General: Sleepy, states tired, otherwise oriented, NAD, trach Cardiovascular: S1 S2 clear, RRR.  Respiratory: CTAB anteriorly Gastrointestinal: Soft, nontender,  nondistended, NBS Ext: + pedal edema bilaterally Psych: Normal affect     Data Reviewed:  I have personally reviewed following labs    CBC Lab Results  Component Value Date   WBC 6.0 08/17/2022   RBC 4.66 08/17/2022   HGB 9.9 (L) 08/17/2022   HCT 38.3 (L) 08/17/2022   MCV 82.2 08/17/2022   MCH 21.2 (L) 08/17/2022   PLT 164 08/17/2022   MCHC 25.8 (L) 08/17/2022   RDW 17.5 (H) 08/17/2022   LYMPHSABS  1.2 08/15/2022   MONOABS 0.8 08/15/2022   EOSABS 0.1 08/15/2022   BASOSABS 0.0 08/15/2022     Last metabolic panel Lab Results  Component Value Date   NA 138 08/18/2022   K 4.0 08/18/2022   CL 96 (L) 08/18/2022   CO2 37 (H) 08/18/2022   BUN 25 (H) 08/18/2022   CREATININE 1.45 (H) 08/18/2022   GLUCOSE 91 08/18/2022   GFRNONAA 53 (L) 08/18/2022   GFRAA 91 03/15/2020   CALCIUM 8.6 (L) 08/18/2022   PHOS 4.1 08/14/2022   PROT 7.1 08/16/2022   ALBUMIN 2.5 (L) 08/16/2022   BILITOT 0.5 08/16/2022   ALKPHOS 88 08/16/2022   AST 11 (L) 08/16/2022   ALT 12 08/16/2022   ANIONGAP 5 08/18/2022    CBG (last 3)  No results for input(s): "GLUCAP" in the last 72 hours.    Coagulation Profile: No results for input(s): "INR", "PROTIME" in the last 168 hours.   Radiology Studies: I have personally reviewed the imaging studies  No results found.     Thad Ranger M.D. [facility] 08/18/2022, 12:29 PM  Available via Epic secure chat 7am-7pm After 7 pm, please refer to night coverage provider listed on amion.

## 2022-08-19 DIAGNOSIS — I509 Heart failure, unspecified: Secondary | ICD-10-CM | POA: Diagnosis not present

## 2022-08-19 DIAGNOSIS — I5033 Acute on chronic diastolic (congestive) heart failure: Secondary | ICD-10-CM | POA: Diagnosis not present

## 2022-08-19 DIAGNOSIS — N1831 Chronic kidney disease, stage 3a: Secondary | ICD-10-CM | POA: Diagnosis not present

## 2022-08-19 DIAGNOSIS — R0902 Hypoxemia: Secondary | ICD-10-CM | POA: Diagnosis not present

## 2022-08-19 LAB — BASIC METABOLIC PANEL
Anion gap: 4 — ABNORMAL LOW (ref 5–15)
BUN: 26 mg/dL — ABNORMAL HIGH (ref 8–23)
CO2: 34 mmol/L — ABNORMAL HIGH (ref 22–32)
Calcium: 8.3 mg/dL — ABNORMAL LOW (ref 8.9–10.3)
Chloride: 99 mmol/L (ref 98–111)
Creatinine, Ser: 1.32 mg/dL — ABNORMAL HIGH (ref 0.61–1.24)
GFR, Estimated: 60 mL/min — ABNORMAL LOW (ref >60)
Glucose, Bld: 108 mg/dL — ABNORMAL HIGH (ref 70–99)
Potassium: 4.3 mmol/L (ref 3.5–5.1)
Sodium: 137 mmol/L (ref 135–145)

## 2022-08-19 MED ORDER — FUROSEMIDE 10 MG/ML IJ SOLN
40.0000 mg | Freq: Every day | INTRAMUSCULAR | Status: DC
  Administered 2022-08-19 – 2022-08-20 (×2): 40 mg via INTRAVENOUS
  Filled 2022-08-19 (×2): qty 4

## 2022-08-19 MED ORDER — GUAIFENESIN-DM 100-10 MG/5ML PO SYRP
5.0000 mL | ORAL_SOLUTION | ORAL | Status: DC | PRN
  Administered 2022-08-19 – 2022-08-21 (×4): 5 mL via ORAL
  Filled 2022-08-19 (×4): qty 5

## 2022-08-19 NOTE — Progress Notes (Signed)
[facility]                                                                              Gary [name], is a 67 y.o. male, DOB - 05/09/1955, XBM:[id number] Admit date - 08/12/2022    Outpatient Primary MD for the patient is [facility]  LOS - 6  days  Chief Complaint  Patient presents with   Shortness of Breath       Brief summary   Patient is a 67 year old male with chronic respiratory failure + tracheotomy (Shiley XLT size 7 uncuffed) since 10/03/2021 --lengthy hospitalization, 09/19/2021-12/13/2021 admission at [facility] (Acute respiratory failure, Vap, AKI with hyperkalemia, melena with active GI bleed proximal sigmoid colon + embolization by IR, multiple transfusions etc. etc.-see discharge summary dated 12/13/2021) uses nocturnal vent currently, COPD--OSA-now on nocturnal vent, prior GI bleed requiring IR intervention as above, hypertension, CKD stage IIIb, morbid obesity, multiple prolonged hospitalizations over the year.   Admitted 04/26/2022-07/25/2022 with acute on chronic respiratory failure, was diuresed, hospitalization was complicated by difficult to place, trach and qhs vent.  He is on trach collar during the day 25 to 30% on vent at night.  Patient presented to ED with increasing shortness of breath.  He was on torsemide 20 mg daily, reported increasing dyspnea, bilateral lower extremity edema and weight gain.  Also reported increased oxygen requirement.  Stated that he was seen in the ED in [place] a few days ago and was placed on antibiotic for pneumonia, which he never picked up from pharmacy.  Also had substernal chest pain for several days.  Patient was requiring 10 L supplemental O2, 50% FiO2 via trach to maintain sats in 90s, afebrile.  Hemoglobin 9.9, no leukocytosis.  COVID influenza, RSV PCR negative.  Chest x-ray concerning for pulmonary edema.  PCCM and cardiology was consulted     Assessment & Plan    Principal Problem:   Acute on  chronic heart failure with preserved ejection fraction (HFpEF) ([name]), dyspnea multifactorial -Cardiology was consulted.  2D echo showed EF of 65%, no evidence of PAH. -Patient was started on IV Lasix 60 mg twice daily and Diamox was added 500 mg twice daily (outpatient on torsemide 20 mg daily) -Received Diamox 500 mg twice daily x 2 doses on 5/4  - Weight 415lbs on admission->> 409 -Continue midodrine -Difficult to assess volume with his body habitus - Placed on IV Lasix for diuresis.  Negative balance of 9.2 L, hold Lasix - follow renal function, if creatinine improving, will resume Lasix 40 mg IV daily.   Active Problems:  Acute on chronic respiratory failure with hypoxia, trach/vent dependent, OSA, OHS -Trach collar during the day and on nighttime vent. -Respiratory virus panel negative. -CCM following, trach and vent management per CCM  -Patient will remain in ICU for his ventilator requirements. -Palliative medicine was consulted for GOC, remains full code    OSA (obstructive sleep apnea), COPD -Vent dependent at night, quit smoking several years ago -Continue vent adjustments per CCM    Chronic kidney disease, stage 3a ([name]) -Received Diamox 500 mg twice daily for 2 doses on 4/30 -CO2 trending  up, will give Diamox x2 -Await bmet  Depression -Continue Lexapro, hydroxyzine  Chronic DVT -Continue eliquis  Goals of care Patient with chronic respiratory failure with tracheostomy and nocturnal vent, diastolic CHF, OSA, morbid obesity, COPD, CKD3a.  - he would best benefit from from being in LTAC. -Discussed in detail with the patient and explained LTAC, he is fully agreeable with the plan. Palliative medicine was able to reach his sister, remains full code and agreeable for LTAC. -TOC notified, currently no beds available,  DC when bed available   Morbid obesity Estimated body mass index is 51.14 kg/m as calculated from the following:   Height as of this encounter: 6\' 3"   (1.905 m).   Weight as of this encounter: 185.6 kg.  Code Status: Full code DVT Prophylaxis:  Place TED hose Start: 08/17/22 0746 apixaban (ELIQUIS) tablet 2.5 mg Start: 08/15/22 1000 apixaban (ELIQUIS) tablet 2.5 mg   Level of Care: Level of care: ICU Family Communication: No family at bedside Disposition Plan:      Remains inpatient appropriate: TOC requested to look into LTAC for disposition.  [facility]waiting bed at [facility].   Procedures:  2D echo  Consultants:   CCM Cardiology  Antimicrobials:   Medications  apixaban  2.5 mg Oral BID   Chlorhexidine Gluconate Cloth  6 each Topical Daily   docusate sodium  100 mg Oral Daily   escitalopram  5 mg Oral Daily   ferrous sulfate  325 mg Oral Q breakfast   midodrine  10 mg Oral TID WC   mouth rinse  15 mL Mouth Rinse 4 times per day   pantoprazole  40 mg Oral BID AC   potassium chloride  40 mEq Oral Daily   sodium chloride flush  10-40 mL Intracatheter Q12H      Subjective:   Gary [name] was seen and examined today.  No acute complaints per the patient.  Watching TV.  Asking for a new bed "sizeways".  Objective:   Vitals:   08/19/22 0830 08/19/22 0856 08/19/22 0900 08/19/22 1135  BP:   116/69   Pulse:  81 81   Resp:  16 14   Temp: 98.2 F (36.8 C)   97.9 F (36.6 C)  TempSrc: Oral   Oral  SpO2:  95% 96%   Weight:      Height:        Intake/Output Summary (Last 24 hours) at 08/19/2022 1152 Last data filed at 08/19/2022 0600 Gross per 24 hour  Intake --  Output 1250 ml  Net -1250 ml     Wt Readings from Last 3 Encounters:  08/19/22 (!) 185.6 kg  04/14/22 (!) 160.4 kg  04/06/22 (!) 193.2 kg   Physical Exam General: Alert and oriented x 3, trach+ Cardiovascular: S1 S2 clear, RRR.  Respiratory: CTAB, anteriorly, no wheezing Gastrointestinal: Soft, nontender, nondistended, NBS Ext: + pedal edema bilaterally Psych: Normal affect    Data Reviewed:  I have personally reviewed following labs    CBC Lab  Results  Component Value Date   WBC 6.0 08/17/2022   RBC 4.66 08/17/2022   HGB 9.9 (L) 08/17/2022   HCT 38.3 (L) 08/17/2022   MCV 82.2 08/17/2022   MCH 21.2 (L) 08/17/2022   PLT 164 08/17/2022   MCHC 25.8 (L) 08/17/2022   RDW 17.5 (H) 08/17/2022   LYMPHSABS 1.2 08/15/2022   MONOABS 0.8 08/15/2022   EOSABS 0.1 08/15/2022   BASOSABS 0.0 08/15/2022     Last metabolic panel Lab Results  Component Value Date   NA 138 08/18/2022   K 4.0 08/18/2022   CL 96 (L) 08/18/2022   CO2 37 (H) 08/18/2022   BUN 25 (H) 08/18/2022   CREATININE 1.45 (H) 08/18/2022   GLUCOSE 91 08/18/2022   GFRNONAA 53 (L) 08/18/2022   GFRAA 91 03/15/2020   CALCIUM 8.6 (L) 08/18/2022   PHOS 4.1 08/14/2022   PROT 7.1 08/16/2022   ALBUMIN 2.5 (L) 08/16/2022   BILITOT 0.5 08/16/2022   ALKPHOS 88 08/16/2022   AST 11 (L) 08/16/2022   ALT 12 08/16/2022   ANIONGAP 5 08/18/2022    CBG (last 3)  No results for input(s): "GLUCAP" in the last 72 hours.    Coagulation Profile: No results for input(s): "INR", "PROTIME" in the last 168 hours.   Radiology Studies: I have personally reviewed the imaging studies  No results found.     Thad Ranger M.D. [facility] 08/19/2022, 11:52 AM  Available via Epic secure chat 7am-7pm After 7 pm, please refer to night coverage provider listed on amion.

## 2022-08-20 DIAGNOSIS — G4733 Obstructive sleep apnea (adult) (pediatric): Secondary | ICD-10-CM | POA: Diagnosis not present

## 2022-08-20 DIAGNOSIS — R0902 Hypoxemia: Secondary | ICD-10-CM | POA: Diagnosis not present

## 2022-08-20 DIAGNOSIS — I509 Heart failure, unspecified: Secondary | ICD-10-CM | POA: Diagnosis not present

## 2022-08-20 DIAGNOSIS — J449 Chronic obstructive pulmonary disease, unspecified: Secondary | ICD-10-CM | POA: Diagnosis not present

## 2022-08-20 DIAGNOSIS — Z7189 Other specified counseling: Secondary | ICD-10-CM | POA: Diagnosis not present

## 2022-08-20 DIAGNOSIS — I5033 Acute on chronic diastolic (congestive) heart failure: Secondary | ICD-10-CM | POA: Diagnosis not present

## 2022-08-20 LAB — BASIC METABOLIC PANEL
Anion gap: 4 — ABNORMAL LOW (ref 5–15)
BUN: 24 mg/dL — ABNORMAL HIGH (ref 8–23)
CO2: 36 mmol/L — ABNORMAL HIGH (ref 22–32)
Calcium: 8.5 mg/dL — ABNORMAL LOW (ref 8.9–10.3)
Chloride: 98 mmol/L (ref 98–111)
Creatinine, Ser: 1.41 mg/dL — ABNORMAL HIGH (ref 0.61–1.24)
GFR, Estimated: 55 mL/min — ABNORMAL LOW (ref >60)
Glucose, Bld: 95 mg/dL (ref 70–99)
Potassium: 3.7 mmol/L (ref 3.5–5.1)
Sodium: 138 mmol/L (ref 135–145)

## 2022-08-20 NOTE — Progress Notes (Signed)
PT Cancellation Note  Patient Details Name: Gary [name] MRN: [id number] DOB: [birth year]   Cancelled Treatment:    Reason Eval/Treat Not Completed: Patient declined, no reason specified (pt informed 2.5 hrs prior that P.T would come at 10. Upon arrival pt refusing to mobilize today stating "its too early" and "I'll just wait til i go upstairs". Pt not agreeable to mobility this date despite encouragement)   Mardell Cragg B Devonne Kitchen 08/20/2022, 10:03 AM Merryl Hacker, PT Acute Rehabilitation Services Office: [phone]

## 2022-08-20 NOTE — TOC Progression Note (Addendum)
Transition of Care [facility]) - Progression Note    Patient Details  Name: Gary [name] MRN: [id number] Date of Birth: Jul 10, 1955  Transition of Care [facility]) CM/SW Contact  Elliot Cousin, RN Phone Number: [phone] 08/20/2022, 11:15 AM  Clinical Narrative:   CM contacted [facility] rep, Alden Server for bed availability at [facility] to possible dc today.  1300 Received notification from [facility] rep, Alden Server, they do not have admission bed today. Pt waiting [facility] at [facility]. Attending updated.     Expected Discharge Plan: Long Term Acute Care (LTAC) Barriers to Discharge: Continued Medical Work up  Expected Discharge Plan and Services   Discharge Planning Services: CM Consult Post Acute Care Choice: Long Term Acute Care (LTAC) Living arrangements for the past 2 months: Apartment                                       Social Determinants of Health (SDOH) Interventions SDOH Screenings   Food Insecurity: No Food Insecurity (08/13/2022)  Housing: Low Risk  (08/13/2022)  Transportation Needs: Unmet Transportation Needs (08/13/2022)  Utilities: Not At Risk (08/13/2022)  Depression (PHQ2-9): Low Risk  (06/07/2021)  Financial Resource Strain: Medium Risk (05/03/2017)  Physical Activity: Insufficiently Active (05/03/2017)  Social Connections: Moderately Integrated (05/03/2017)  Stress: No Stress Concern Present (05/03/2017)  Tobacco Use: Medium Risk (04/26/2022)    Readmission Risk Interventions    12/31/2021    8:58 AM 12/20/2021    4:21 PM 05/04/2021    1:26 PM  Readmission Risk Prevention Plan  Transportation Screening Complete Complete Complete  Medication Review Oceanographer) Complete Complete Complete  PCP or Specialist appointment within 3-5 days of discharge Complete Complete Complete  HRI or Home Care Consult Complete Complete Complete  SW Recovery Care/Counseling Consult Complete Complete Complete  Palliative Care Screening Complete Complete Not Applicable  Skilled  Nursing Facility Not Applicable Not Applicable Not Applicable

## 2022-08-20 NOTE — Progress Notes (Signed)
[facility]                                                                              Gary [name], is a 67 y.o. male, DOB - [birth year], JOA:[id number] Admit date - 08/12/2022    Outpatient Primary MD for the patient is [facility]  LOS - 7  days  Chief Complaint  Patient presents with   Shortness of Breath       Brief summary   Patient is a 67 year old male with chronic respiratory failure + tracheotomy (Shiley XLT size 7 uncuffed) since 10/03/2021 --lengthy hospitalization, 09/19/2021-12/13/2021 admission at [facility] (Acute respiratory failure, Vap, AKI with hyperkalemia, melena with active GI bleed proximal sigmoid colon + embolization by IR, multiple transfusions etc. etc.-see discharge summary dated 12/13/2021) uses nocturnal vent currently, COPD--OSA-now on nocturnal vent, prior GI bleed requiring IR intervention as above, hypertension, CKD stage IIIb, morbid obesity, multiple prolonged hospitalizations over the year.   Admitted 04/26/2022-07/25/2022 with acute on chronic respiratory failure, was diuresed, hospitalization was complicated by difficult to place, trach and qhs vent.  He is on trach collar during the day 25 to 30% on vent at night.  Patient presented to ED with increasing shortness of breath.  He was on torsemide 20 mg daily, reported increasing dyspnea, bilateral lower extremity edema and weight gain.  Also reported increased oxygen requirement.  Stated that he was seen in the ED in [place] a few days ago and was placed on antibiotic for pneumonia, which he never picked up from pharmacy.  Also had substernal chest pain for several days.  Patient was requiring 10 L supplemental O2, 50% FiO2 via trach to maintain sats in 90s, afebrile.  Hemoglobin 9.9, no leukocytosis.  COVID influenza, RSV PCR negative.  Chest x-ray concerning for pulmonary edema.  PCCM and cardiology was consulted     Assessment & Plan    Principal Problem:   Acute on  chronic heart failure with preserved ejection fraction (HFpEF) ([name]), dyspnea multifactorial -Cardiology was consulted.  2D echo showed EF of 65%, no evidence of PAH. -Patient was started on IV Lasix 60 mg twice daily and Diamox was added 500 mg twice daily (outpatient on torsemide 20 mg daily) -Received Diamox 500 mg twice daily x 2 doses on 5/4  - Weight 415lbs on admission->> 403lbs  -Continue midodrine -Difficult to assess volume with his body habitus -Currently on IV Lasix for diuresis, negative balance of 11.0 L.   -Monitor renal function closely, if continues trending up, will DC IV Lasix and changed to p.o. torsemide  Active Problems:  Acute on chronic respiratory failure with hypoxia, trach/vent dependent, OSA, OHS -Trach collar during the day and on nighttime vent. -Respiratory virus panel negative. -CCM following, trach and vent management per CCM  -Patient will remain in ICU for his ventilator requirements. -Palliative medicine was consulted for GOC, remains full code    OSA (obstructive sleep apnea), COPD -Vent dependent at night, quit smoking several years ago -Continue vent adjustments per CCM    Chronic kidney disease, stage 3a ([name]) -Received Diamox 500 mg twice daily for 2 doses on 4/30 -follow  BMET  Depression -Continue Lexapro, hydroxyzine  Chronic DVT -Continue eliquis  Goals of care Patient with chronic respiratory failure with tracheostomy and nocturnal vent, diastolic CHF, OSA, morbid obesity, COPD, CKD3a.  - he would best benefit from from being in LTAC. -Discussed in detail with the patient and explained LTAC, he is fully agreeable with the plan. Palliative medicine was able to reach his sister, remains full code and agreeable for LTAC. -TOC assisting with disposition   Morbid obesity Estimated body mass index is 50.48 kg/m as calculated from the following:   Height as of this encounter: 6\' 3"  (1.905 m).   Weight as of this encounter: 183.2  kg.  Code Status: Full code DVT Prophylaxis:  Place TED hose Start: 08/17/22 0746 apixaban (ELIQUIS) tablet 2.5 mg Start: 08/15/22 1000 apixaban (ELIQUIS) tablet 2.5 mg   Level of Care: Level of care: ICU Family Communication: No family at bedside Disposition Plan:      Remains inpatient appropriate: TOC requested to look into LTAC for disposition.  Awaiting bed at [facility], medically stable to discharge to [facility] when bed available.   Procedures:  2D echo  Consultants:   CCM Cardiology  Antimicrobials:   Medications  apixaban  2.5 mg Oral BID   Chlorhexidine Gluconate Cloth  6 each Topical Daily   docusate sodium  100 mg Oral Daily   escitalopram  5 mg Oral Daily   ferrous sulfate  325 mg Oral Q breakfast   furosemide  40 mg Intravenous Daily   midodrine  10 mg Oral TID WC   mouth rinse  15 mL Mouth Rinse 4 times per day   pantoprazole  40 mg Oral BID AC   potassium chloride  40 mEq Oral Daily   sodium chloride flush  10-40 mL Intracatheter Q12H      Subjective:   Gary [name] was seen and examined today.  No acute complaints per the patient. Watching TV .  Objective:   Vitals:   08/20/22 1000 08/20/22 1100 08/20/22 1200 08/20/22 1220  BP: (!) 101/59 (!) 97/57 (!) 101/56   Pulse: 76 71 77 76  Resp: 15 20 (!) 31 (!) 38  Temp:      TempSrc:      SpO2: 95% 98% 94% 96%  Weight:      Height:        Intake/Output Summary (Last 24 hours) at 08/20/2022 1313 Last data filed at 08/20/2022 0800 Gross per 24 hour  Intake 60 ml  Output 1450 ml  Net -1390 ml     Wt Readings from Last 3 Encounters:  08/20/22 (!) 183.2 kg  04/14/22 (!) 160.4 kg  04/06/22 (!) 193.2 kg   Physical Exam General: Alert and oriented x 3, NAD, trach + Cardiovascular: S1 S2 clear, RRR.  Respiratory: CTAB Gastrointestinal: obese Soft, nontender, nondistended, NBS Ext: no pedal edema bilaterally Psych: Normal affect    Data Reviewed:  I have personally reviewed following labs     CBC Lab Results  Component Value Date   WBC 6.0 08/17/2022   RBC 4.66 08/17/2022   HGB 9.9 (L) 08/17/2022   HCT 38.3 (L) 08/17/2022   MCV 82.2 08/17/2022   MCH 21.2 (L) 08/17/2022   PLT 164 08/17/2022   MCHC 25.8 (L) 08/17/2022   RDW 17.5 (H) 08/17/2022   LYMPHSABS 1.2 08/15/2022   MONOABS 0.8 08/15/2022   EOSABS 0.1 08/15/2022   BASOSABS 0.0 08/15/2022     Last metabolic panel Lab Results  Component Value Date  NA 138 08/20/2022   K 3.7 08/20/2022   CL 98 08/20/2022   CO2 36 (H) 08/20/2022   BUN 24 (H) 08/20/2022   CREATININE 1.41 (H) 08/20/2022   GLUCOSE 95 08/20/2022   GFRNONAA 55 (L) 08/20/2022   GFRAA 91 03/15/2020   CALCIUM 8.5 (L) 08/20/2022   PHOS 4.1 08/14/2022   PROT 7.1 08/16/2022   ALBUMIN 2.5 (L) 08/16/2022   BILITOT 0.5 08/16/2022   ALKPHOS 88 08/16/2022   AST 11 (L) 08/16/2022   ALT 12 08/16/2022   ANIONGAP 4 (L) 08/20/2022    CBG (last 3)  No results for input(s): "GLUCAP" in the last 72 hours.    Coagulation Profile: No results for input(s): "INR", "PROTIME" in the last 168 hours.   Radiology Studies: I have personally reviewed the imaging studies  No results found.     Thad Ranger M.D. [facility] 08/20/2022, 1:13 PM  Available via Epic secure chat 7am-7pm After 7 pm, please refer to night coverage provider listed on amion.

## 2022-08-20 NOTE — Progress Notes (Signed)
Patient ID: Gary [name], male   DOB: [birth year], 67 y.o.   MRN: [id number]    Progress Note from the Palliative Medicine Team at [facility]   Patient Name: Gary [name]        Date: 08/20/2022 DOB: Aug 21, 1955  Age: 67 y.o. MRN#: [id number] Attending Physician: Cathren Harsh, MD Primary Care Physician: [facility] Admit Date: 08/12/2022   Medical records reviewed   67 y.o. male  with past medical history of chronic respiratory failure w/ tracheotomy since 10/03/21 on nocturnal vent, COPD, OSA, GIB, HTN, CKD, morbid obesity, and multiple prolonged hospitalizations admitted on 08/12/2022 with shortness of breath.  Patient is being treated for acute on chronic heart failure with preserved ejection fraction.  His dyspnea is multifactorial.  PMT consulted to discuss goals of care.    This NP assessed patient at the bedside as a follow up to  yesterday's GOCs meeting.  Patient is a 67 year old male with chronic respiratory failure + tracheotomy (Shiley XLT size 7 uncuffed) since 10/03/2021 --lengthy hospitalization, 09/19/2021-12/13/2021 admission at [facility] (Acute respiratory failure, Vap, AKI with hyperkalemia, melena with active GI bleed proximal sigmoid colon + embolization by IR, multiple transfusions etc. etc.-see discharge summary dated 12/13/2021) uses nocturnal vent currently, COPD--OSA-now on nocturnal vent, prior GI bleed requiring IR intervention as above, hypertension, CKD stage IIIb, morbid obesity, multiple prolonged hospitalizations over the year.   Admitted 04/26/2022-07/25/2022 with acute on chronic respiratory failure, was diuresed, hospitalization was complicated by difficult to place, trach and qhs vent.  He is on trach collar during the day 25 to 30% on vent at night.   Patient presented to ED with increasing shortness of breath.  He was on torsemide 20 mg daily, reported increasing dyspnea, bilateral lower extremity edema and weight gain.  Also reported increased  oxygen requirement.    Patient was requiring 10 L supplemental O2, 50% FiO2 via trach to maintain sats in 90s, afebrile.  Hemoglobin 9.9, no leukocytosis.  COVID influenza, RSV PCR negative.  Chest x-ray concerning for pulmonary edema.   Multiple rehospitalization and high risk for decompensation.  Patient faces treatment option decisions, advanced directive decisions and anticipatory care needs.  I visited with Gary [name] today, his main concern was his bariatric bed I was able to discuss with portable his desire for a "size wise" which they tell me is on order and when the hospital gets to the bed they will bring it to his room.  Education offered on the importance of self-care and sponsor ability in his medical decisions and treatment plan.  We spoke specifically to mobility and the importance of active range of motion even for a person who is bedbound.[name]  He verbalizes understanding.  Education offered on the importance of discussion and documentation of H POA and advance care planning documents.  Gary [name] will "think about it"   Education offered today regarding  the importance of continued conversation with his family and their  medical providers regarding overall plan of care and treatment options,  ensuring decisions are within the context of the patients values and GOCs.  Questions and concerns addressed     Plan is for LTAC when bed is available.  PMT will continue to support while he is hospitalized  Time: 50 minutes  Detailed review of medical records ( labs, imaging, vital signs), medically appropriate exam ( MS, skin, resp)   discussed with treatment team, counseling and education to patient, family, staff, documenting clinical information, medication management,  coordination of care    Lorinda Creed NP  Palliative Medicine Team Team Phone # [phone] Pager [phone]

## 2022-08-21 DIAGNOSIS — I509 Heart failure, unspecified: Secondary | ICD-10-CM | POA: Diagnosis not present

## 2022-08-21 DIAGNOSIS — J449 Chronic obstructive pulmonary disease, unspecified: Secondary | ICD-10-CM

## 2022-08-21 DIAGNOSIS — N1831 Chronic kidney disease, stage 3a: Secondary | ICD-10-CM

## 2022-08-21 DIAGNOSIS — J9621 Acute and chronic respiratory failure with hypoxia: Secondary | ICD-10-CM

## 2022-08-21 DIAGNOSIS — I5033 Acute on chronic diastolic (congestive) heart failure: Secondary | ICD-10-CM

## 2022-08-21 DIAGNOSIS — G4733 Obstructive sleep apnea (adult) (pediatric): Secondary | ICD-10-CM | POA: Diagnosis not present

## 2022-08-21 DIAGNOSIS — R0902 Hypoxemia: Secondary | ICD-10-CM | POA: Diagnosis not present

## 2022-08-21 LAB — BASIC METABOLIC PANEL
Anion gap: 3 — ABNORMAL LOW (ref 5–15)
BUN: 25 mg/dL — ABNORMAL HIGH (ref 8–23)
CO2: 34 mmol/L — ABNORMAL HIGH (ref 22–32)
Calcium: 8.3 mg/dL — ABNORMAL LOW (ref 8.9–10.3)
Chloride: 100 mmol/L (ref 98–111)
Creatinine, Ser: 1.36 mg/dL — ABNORMAL HIGH (ref 0.61–1.24)
GFR, Estimated: 58 mL/min — ABNORMAL LOW (ref >60)
Glucose, Bld: 111 mg/dL — ABNORMAL HIGH (ref 70–99)
Potassium: 4 mmol/L (ref 3.5–5.1)
Sodium: 137 mmol/L (ref 135–145)

## 2022-08-21 MED ORDER — MIDODRINE HCL 10 MG PO TABS: 10.0000 mg | ORAL_TABLET | Freq: Three times a day (TID) | ORAL | Status: DC

## 2022-08-21 MED ORDER — FUROSEMIDE 10 MG/ML IJ SOLN: 40.0000 mg | Freq: Two times a day (BID) | INTRAMUSCULAR | Status: DC

## 2022-08-21 MED ORDER — DOCUSATE SODIUM 100 MG PO CAPS: 100.0000 mg | ORAL_CAPSULE | Freq: Every day | ORAL | 0 refills | Status: DC

## 2022-08-21 MED ORDER — FUROSEMIDE 10 MG/ML IJ SOLN: 40.0000 mg | Freq: Two times a day (BID) | INTRAMUSCULAR | 0 refills | Status: DC

## 2022-08-21 MED ORDER — POTASSIUM CHLORIDE CRYS ER 20 MEQ PO TBCR
40.0000 meq | EXTENDED_RELEASE_TABLET | Freq: Two times a day (BID) | ORAL | Status: DC
  Administered 2022-08-21: 40 meq via ORAL
  Filled 2022-08-21: qty 2

## 2022-08-21 MED ORDER — POTASSIUM CHLORIDE CRYS ER 20 MEQ PO TBCR: 40.0000 meq | EXTENDED_RELEASE_TABLET | Freq: Two times a day (BID) | ORAL | Status: DC

## 2022-08-21 NOTE — Progress Notes (Signed)
PT Cancellation Note  Patient Details Name: Gary [name] MRN: [id number] DOB: 1955-07-05   Cancelled Treatment:    Reason Eval/Treat Not Completed: Patient declined, no reason specified (Set time for tx at 10am yesterday and confirmed at 0830 today but upon arrival pt refusing to attempt any mobility. "its too early" "I don't get up til after noon")   Gary [name] B [name] 08/21/2022, 10:06 AM Merryl Hacker, PT Acute Rehabilitation Services Office: [phone]

## 2022-08-21 NOTE — Progress Notes (Signed)
   NAME:  Gary [name], MRN:  [id number], DOB:  1955/08/03, LOS: 8 ADMISSION DATE:  08/12/2022, CONSULTATION DATE:  08/13/22 REFERRING MD:  EDP, CHIEF COMPLAINT:  SOB   History of Present Illness:  67 year old man w/ COPD/OSA s/p trach last year presenting with acute on chronic dyspnea, LE edema, orthopnea.  Uses PMV during day, no vent dependence, ~6lpm blowby baseline. PCCM consulted to help with trach management.  He does admit he is due for a trach change.  ROS as below  Pertinent  Medical History  Class 3 obesity COPD OSA/OHS Prior GIB requiring IR intervention  Significant Hospital Events: Including procedures, antibiotic start and stop dates in addition to other pertinent events   4/28 admit 4/29 pccm consult  Interim History / Subjective:  Diuresing well but even. Using qHS vent. Denies pain. Bps are okay  Objective   Blood pressure 111/67, pulse 80, temperature 97.9 F (36.6 C), temperature source Oral, resp. rate (!) 29, height 6\' 3"  (1.905 m), weight (!) 182.4 kg, SpO2 (!) 89 %. CVP:  [8 mmHg-21 mmHg] 16 mmHg  FiO2 (%):  [40 %-50 %] 40 %   Intake/Output Summary (Last 24 hours) at 08/21/2022 0814 Last data filed at 08/21/2022 7829 Gross per 24 hour  Intake 1320 ml  Output 1000 ml  Net 320 ml    Filed Weights   08/19/22 0500 08/20/22 0500 08/21/22 0411  Weight: (!) 185.6 kg (!) 183.2 kg (!) 182.4 kg    Examination: Deconditioned Improving edema Chronic venous stasis changes Moves to command Speaks well with PMV Heart/lung sounds distant due to body habitus  Resolved Hospital Problem list   N/A  Assessment & Plan:  Decompensated cor pulmonale  Guess at dry weight in 2022 was 350 lbs  Guess at dry weight 2021 363 lbs Currently 415 lbs so have some room to go S/P tracheostomy, chronic vent dependence CKD 2 Acquired deconditioning from multiple hospitalizations  - Increase diuresis - qHS vent - Continue midodrine - Will follow to assist with  diuresis - Dispo pending euvolemia  Best Practice (right click and "Reselect all SmartList Selections" daily)   Per primary  [name] MD PCCM

## 2022-08-21 NOTE — TOC Progression Note (Addendum)
Transition of Care [facility]) - Progression Note    Patient Details  Name: Gary [name] MRN: [id number] Date of Birth: [birth year]  Transition of Care [facility]) CM/SW Contact  Elliot Cousin, RN Phone Number: [phone] 08/21/2022, 10:19 AM  Clinical Narrative:   TOC CM contacted [facility] rep, Alden Server States they have denied him a bed at [facility]. States pt is too close to his baseline.   [facility] rep, [name] will review. Discussed with patient [facility], pt is in agreement vs [facility]. [name] states they can accept referral with bed available today. Attending made aware.   Patient will go to room 413, call report to # [phone]. Accepting physician is Dr Eloisa Northern. Updated Unit RN and he will call [facility] after he called report.      Expected Discharge Plan: Long Term Acute Care (LTAC) Barriers to Discharge: Continued Medical Work up  Expected Discharge Plan and Services   Discharge Planning Services: CM Consult Post Acute Care Choice: Long Term Acute Care (LTAC) Living arrangements for the past 2 months: Apartment                                       Social Determinants of Health (SDOH) Interventions SDOH Screenings   Food Insecurity: No Food Insecurity (08/13/2022)  Housing: Low Risk  (08/13/2022)  Transportation Needs: Unmet Transportation Needs (08/13/2022)  Utilities: Not At Risk (08/13/2022)  Depression (PHQ2-9): Low Risk  (06/07/2021)  Financial Resource Strain: Medium Risk (05/03/2017)  Physical Activity: Insufficiently Active (05/03/2017)  Social Connections: Moderately Integrated (05/03/2017)  Stress: No Stress Concern Present (05/03/2017)  Tobacco Use: Medium Risk (04/26/2022)    Readmission Risk Interventions    12/31/2021    8:58 AM 12/20/2021    4:21 PM 05/04/2021    1:26 PM  Readmission Risk Prevention Plan  Transportation Screening Complete Complete Complete  Medication Review Oceanographer) Complete Complete Complete  PCP or  Specialist appointment within 3-5 days of discharge Complete Complete Complete  HRI or Home Care Consult Complete Complete Complete  SW Recovery Care/Counseling Consult Complete Complete Complete  Palliative Care Screening Complete Complete Not Applicable  Skilled Nursing Facility Not Applicable Not Applicable Not Applicable

## 2022-08-21 NOTE — Discharge Summary (Signed)
Physician Discharge Summary   Patient: Gary [name] MRN: [id number] DOB: December 22, 1955  Admit date:     08/12/2022  Discharge date: 08/21/22  Discharge Physician: Thad Ranger, MD    PCP: [facility]   Recommendations at discharge:   Continue IV Lasix 40 mg twice daily, daily weights, I's and O's until close to dry weight Admitted with weight of 415lbs on 08/12/2022, today 402lbs at discharge, dry weight in 2022 was ~350lbs and 363 lbs in 2021 Torsemide on hold while patient is on IV Lasix Trach collar during the day and on nighttime vent.   Discharge Diagnoses:    Acute on chronic heart failure with preserved ejection fraction (HFpEF) ([name])   OSA (obstructive sleep apnea)   Chronic kidney disease, stage 3a ([name])   Chronic obstructive pulmonary disease (COPD) ([name])   Depression   Chronic DVT   Morbid obesity   Hospital Course: Patient is a 67 year old male with chronic respiratory failure + tracheotomy (Shiley XLT size 7 uncuffed) since 10/03/2021 --lengthy hospitalization, 09/19/2021-12/13/2021 admission at [facility] (Acute respiratory failure, Vap, AKI with hyperkalemia, melena with active GI bleed proximal sigmoid colon + embolization by IR, multiple transfusions etc. etc.-see discharge summary dated 12/13/2021) uses nocturnal vent currently, COPD--OSA-now on nocturnal vent, prior GI bleed requiring IR intervention as above, hypertension, CKD stage IIIb, morbid obesity, multiple prolonged hospitalizations over the year.   Admitted 04/26/2022-07/25/2022 with acute on chronic respiratory failure, was diuresed, hospitalization was complicated by difficult to place, trach and qhs vent.  He is on trach collar during the day 25 to 30% on vent at night.   Patient presented to ED with increasing shortness of breath.  He was on torsemide 20 mg daily, reported increasing dyspnea, bilateral lower extremity edema and weight gain.  Also reported increased oxygen requirement.  Stated that he  was seen in the ED in [place] a few days ago and was placed on antibiotic for pneumonia, which he never picked up from pharmacy.  Also had substernal chest pain for several days.   Patient was requiring 10 L supplemental O2, 50% FiO2 via trach to maintain sats in 90s, afebrile.  Hemoglobin 9.9, no leukocytosis.  COVID influenza, RSV PCR negative.  Chest x-ray concerning for pulmonary edema.   PCCM and cardiology was consulted.  Patient was admitted for further workup.     Assessment and Plan:  Acute on chronic heart failure with preserved ejection fraction (HFpEF) ([name]), dyspnea multifactorial -Patient was seen by heart failure team.  2D echo showed EF of 65%, no evidence of PAH. -Patient was started on IV Lasix 60 mg twice daily and Diamox was added 500 mg twice daily (outpatient on torsemide 20 mg daily) -Received Diamox 500 mg twice daily x 2 doses on 5/4  - Weight 415lbs on admission->> 402lbs after discharge today -Continue midodrine -Difficult to assess volume with his body habitus -Patient was followed closely by pulmonology, increase to Lasix to 40 mg twice daily, negative balance of 10.4 L at discharge. -Continue IV Lasix 40 mg twice daily, follow renal function closely.  Can titrate up as tolerated, likely until patient is closer to his dry weight around 350- 360lbs.  -Currently torsemide is on hold  Acute on chronic respiratory failure with hypoxia, trach/vent dependent, OSA, OHS -Trach collar during the day and on nighttime vent. -Respiratory virus panel negative. -Patient was closely followed by pulmonary critical care -Palliative medicine was consulted for GOC, remains full code     OSA (obstructive sleep  apnea), COPD -Vent dependent at night, quit smoking several years ago     Chronic kidney disease, stage 3a ([name]) -Received Diamox 500 mg twice daily for 2 doses on 4/30 -Creatinine stable, 1.3 at discharge, close to baseline -Continue IV Lasix, follow renal  function   Depression -Continue Lexapro, hydroxyzine   Chronic DVT -Continue eliquis   Goals of care Acquired deconditioning from multiple hospitalizations Patient with chronic respiratory failure with tracheostomy and nocturnal vent, diastolic CHF, OSA, morbid obesity, COPD, CKD3a.  - he would best benefit from from being in LTAC. -Discussed in detail with the patient and explained LTAC, he is fully agreeable with the plan. Palliative medicine was able to reach his sister, remains full code and agreeable for LTAC.     Morbid obesity Estimated body mass index is 50.48 kg/m as calculated from the following:   Height as of this encounter: 6\' 3"  (1.905 m).   Weight as of this encounter: 183.2 kg.      Pain control - [employer] Controlled Substance Reporting System database was reviewed. and patient was instructed, not to drive, operate heavy machinery, perform activities at heights, swimming or participation in water activities or provide baby-sitting services while on Pain, Sleep and Anxiety Medications; until their outpatient Physician has advised to do so again. Also recommended to not to take more than prescribed Pain, Sleep and Anxiety Medications.  Consultants: Cardiology/CHF team, pulmonology critical care Procedures performed: 2D echo Disposition: Long term care facility Diet recommendation:  Discharge Diet Orders (From admission, onward)     Start     Ordered   08/21/22 0000  Diet - low sodium heart healthy        08/21/22 1140            DISCHARGE MEDICATION: Allergies as of 08/21/2022   No Known Allergies      Medication List     STOP taking these medications    torsemide 20 MG tablet Commonly known as: DEMADEX       TAKE these medications    acetaminophen 325 MG tablet Commonly known as: TYLENOL Take 2 tablets (650 mg total) by mouth every 6 (six) hours as needed for mild pain (or Fever >/= 101).   apixaban 2.5 MG Tabs tablet Commonly known  as: ELIQUIS Take 1 tablet (2.5 mg total) by mouth 2 (two) times daily.   docusate sodium 100 MG capsule Commonly known as: COLACE Take 1 capsule (100 mg total) by mouth daily. Start taking on: Aug 22, 2022   escitalopram 5 MG tablet Commonly known as: LEXAPRO Take 1 tablet (5 mg total) by mouth daily.   ferrous sulfate 325 (65 FE) MG tablet Take 1 tablet (325 mg total) by mouth daily with breakfast. Home med   furosemide 10 MG/ML injection Commonly known as: LASIX Inject 4 mLs (40 mg total) into the vein 2 (two) times daily.   hydrOXYzine 25 MG tablet Commonly known as: ATARAX Take 1 tablet (25 mg total) by mouth 3 (three) times daily as needed for anxiety.   ipratropium-albuterol 0.5-2.5 (3) MG/3ML Soln Commonly known as: DUONEB Take 3 mLs by nebulization every 6 (six) hours as needed.   melatonin 5 MG Tabs Take 1 tablet (5 mg total) by mouth at bedtime. What changed:  when to take this reasons to take this   midodrine 10 MG tablet Commonly known as: PROAMATINE Take 1 tablet (10 mg total) by mouth 3 (three) times daily with meals.   pantoprazole 40 MG  tablet Commonly known as: PROTONIX Take 1 tablet (40 mg total) by mouth 2 (two) times daily before a meal.   polyethylene glycol 17 g packet Commonly known as: MIRALAX / GLYCOLAX Take 17 g by mouth daily as needed for moderate constipation.   potassium chloride SA 20 MEQ tablet Commonly known as: KLOR-CON M Take 2 tablets (40 mEq total) by mouth 2 (two) times daily.   traZODone 50 MG tablet Commonly known as: DESYREL Take 1 tablet (50 mg total) by mouth at bedtime as needed for sleep.               Discharge Care Instructions  (From admission, onward)           Start     Ordered   08/21/22 0000  If the dressing is still on your incision site when you go home, remove it on the third day after your surgery date. Remove dressing if it begins to fall off, or if it is dirty or damaged before the third day.         08/21/22 1140            Follow-up Information     [facility]. Schedule an appointment as soon as possible for a visit in 2 week(s).   Specialty: General Practice Contact information: [address] [place] [address] [phone]                Discharge Exam: Filed Weights   08/19/22 0500 08/20/22 0500 08/21/22 0411  Weight: (!) 185.6 kg (!) 183.2 kg (!) 182.4 kg   S: No acute complaints per the patient, agreeable with LTACH No acute issues overnight, no fevers or chills, tolerating diet.  BP 100/66 (BP Location: Left Arm)   Pulse 77   Temp 97.9 F (36.6 C) (Oral)   Resp 18   Ht 6\' 3"  (1.905 m)   Wt (!) 182.4 kg   SpO2 95%   BMI 50.26 kg/m    Physical Exam General: Alert and oriented x 3, NAD, trach plus Cardiovascular: S1 S2 clear, RRR.  Respiratory: CTAB anteriorly Gastrointestinal: Obese, soft, nontender, nondistended, NBS Ext: + pedal edema bilaterally with chronic venous stasis changes Psych: Normal affect   Condition at discharge: stable  The results of significant diagnostics from this hospitalization (including imaging, microbiology, ancillary and laboratory) are listed below for reference.   Imaging Studies: ECHOCARDIOGRAM COMPLETE  Result Date: 08/13/2022    ECHOCARDIOGRAM REPORT   Patient Name:   DOUGLAS RACZ Date of Exam: 08/13/2022 Medical Rec #:  [id number]      Height:       75.0 in Accession #:    [id number]     Weight:       415.0 lb Date of Birth:  Dec 17, 1955      BSA:          2.993 m Patient Age:    67 years       BP:           126/80 mmHg Patient Gender: M              HR:           88 bpm. Exam Location:  Inpatient Procedure: 2D Echo, Cardiac Doppler and Color Doppler Indications:    CHF I50.9  History:        Patient has prior history of Echocardiogram examinations, most  recent 04/12/2022. CHF; Risk Factors:Dyslipidemia, Hypertension                 and Former Smoker.  Sonographer:     Dondra Prader RVT RCS Referring Phys: [phone] AMY D CLEGG  Sonographer Comments: No subcostal window, patient is obese, Technically challenging study due to limited acoustic windows, Technically difficult study due to poor echo windows, suboptimal parasternal window and suboptimal apical window. Image acquisition challenging due to patient body habitus and Unable to accurately measure atrium due to very poor and limited windows. Declined definity IMPRESSIONS  1. Left ventricular ejection fraction, by estimation, is 65 to 70%. The left ventricle has normal function. The left ventricle has no regional wall motion abnormalities. There is mild left ventricular hypertrophy. Left ventricular diastolic parameters were normal.  2. Right ventricular systolic function is normal. The right ventricular size is normal.  3. The mitral valve is normal in structure. No evidence of mitral valve regurgitation. No evidence of mitral stenosis.  4. The aortic valve has an indeterminant number of cusps. Aortic valve regurgitation is not visualized. No aortic stenosis is present. FINDINGS  Left Ventricle: Left ventricular ejection fraction, by estimation, is 65 to 70%. The left ventricle has normal function. The left ventricle has no regional wall motion abnormalities. The left ventricular internal cavity size was normal in size. There is  mild left ventricular hypertrophy. Left ventricular diastolic parameters were normal. Right Ventricle: The right ventricular size is normal. Right ventricular systolic function is normal. Left Atrium: Left atrial size was normal in size. Right Atrium: Right atrial size was normal in size. Pericardium: There is no evidence of pericardial effusion. Mitral Valve: The mitral valve is normal in structure. No evidence of mitral valve regurgitation. No evidence of mitral valve stenosis. Tricuspid Valve: The tricuspid valve is normal in structure. Tricuspid valve regurgitation is not demonstrated. No evidence  of tricuspid stenosis. Aortic Valve: The aortic valve has an indeterminant number of cusps. Aortic valve regurgitation is not visualized. No aortic stenosis is present. Aortic valve mean gradient measures 7.0 mmHg. Aortic valve peak gradient measures 13.2 mmHg. Aortic valve area, by VTI measures 4.42 cm. Pulmonic Valve: The pulmonic valve was not well visualized. Pulmonic valve regurgitation is not visualized. No evidence of pulmonic stenosis. Aorta: The aortic root is normal in size and structure. Venous: The inferior vena cava was not well visualized. IAS/Shunts: The interatrial septum was not well visualized.  LEFT VENTRICLE PLAX 2D LVIDd:         5.60 cm   Diastology LVIDs:         3.00 cm   LV e' medial:    8.55 cm/s LV PW:         1.20 cm   LV E/e' medial:  9.3 LV IVS:        1.50 cm   LV e' lateral:   17.60 cm/s LVOT diam:     2.60 cm   LV E/e' lateral: 4.5 LV SV:         121 LV SV Index:   40 LVOT Area:     5.31 cm  RIGHT VENTRICLE RV S prime:     21.30 cm/s TAPSE (M-mode): 3.1 cm LEFT ATRIUM         Index LA diam:    3.80 cm 1.27 cm/m  AORTIC VALVE                     PULMONIC VALVE AV Area (Vmax):  3.82 cm      PV Vmax:       0.62 m/s AV Area (Vmean):   3.69 cm      PV Peak grad:  1.5 mmHg AV Area (VTI):     4.42 cm AV Vmax:           182.00 cm/s AV Vmean:          122.000 cm/s AV VTI:            0.274 m AV Peak Grad:      13.2 mmHg AV Mean Grad:      7.0 mmHg LVOT Vmax:         131.00 cm/s LVOT Vmean:        84.700 cm/s LVOT VTI:          0.228 m LVOT/AV VTI ratio: 0.83  AORTA Ao Root diam: 3.40 cm Ao Asc diam:  3.30 cm MITRAL VALVE MV Area (PHT): 4.16 cm    SHUNTS MV Decel Time: 183 msec    Systemic VTI:  0.23 m MV E velocity: 79.50 cm/s  Systemic Diam: 2.60 cm MV A velocity: 88.65 cm/s MV E/A ratio:  0.90 Olga Millers MD Electronically signed by Olga Millers MD Signature Date/Time: 08/13/2022/5:39:41 PM    Final    [place] EKG SITE RITE  Result Date: 08/13/2022 If Site Rite image not  attached, placement could not be confirmed due to current cardiac rhythm.  DG Chest 2 View  Result Date: 08/12/2022 CLINICAL DATA:  Shortness of breath EXAM: CHEST - 2 VIEW COMPARISON:  Chest x-ray 08/07/2022 FINDINGS: Tracheostomy is unchanged in position at the level of the clavicular heads. The heart is enlarged in the aorta is tortuous, unchanged. There central pulmonary vascular congestion and interstitial edema bilaterally. There are bands of atelectasis in the mid lungs bilaterally. There is no pleural effusion or pneumothorax. No acute fractures are seen. Is IMPRESSION: 1. Cardiomegaly with central pulmonary vascular congestion and interstitial edema. 2. Bands of atelectasis in the mid lungs bilaterally. Electronically Signed   By: Darliss Cheney M.D.   On: 08/12/2022 19:45   DG Chest 2 View  Result Date: 08/07/2022 CLINICAL DATA:  Shortness of breath, low O2 sats EXAM: CHEST - 2 VIEW COMPARISON:  07/10/2022 FINDINGS: Cardiomegaly with vascular congestion. Diffuse interstitial prominence likely reflects interstitial edema. Right mid lung atelectasis. No visible effusions or pneumothorax. Old left rib fractures. No acute bony abnormality. IMPRESSION: Cardiomegaly with vascular congestion and mild interstitial edema. Electronically Signed   By: Charlett Nose M.D.   On: 08/07/2022 17:25    Microbiology: Results for orders placed or performed during the hospital encounter of 08/12/22  Resp panel by RT-PCR (RSV, Flu A&B, Covid) Anterior Nasal Swab     Status: None   Collection Time: 08/12/22  8:30 PM   Specimen: Anterior Nasal Swab  Result Value Ref Range Status   SARS Coronavirus 2 by RT PCR NEGATIVE NEGATIVE Final   Influenza A by PCR NEGATIVE NEGATIVE Final   Influenza B by PCR NEGATIVE NEGATIVE Final    Comment: (NOTE) The Xpert Xpress SARS-CoV-2/FLU/RSV plus assay is intended as an aid in the diagnosis of influenza from Nasopharyngeal swab specimens and should not be used as a sole basis  for treatment. Nasal washings and aspirates are unacceptable for Xpert Xpress SARS-CoV-2/FLU/RSV testing.  Fact Sheet for Patients: BloggerCourse.com  Fact Sheet for Healthcare Providers: SeriousBroker.it  This test is not yet approved or cleared by the [place] and  has been authorized for detection and/or diagnosis of SARS-CoV-2 by FDA under an Emergency Use Authorization (EUA). This EUA will remain in effect (meaning this test can be used) for the duration of the COVID-19 declaration under Section 564(b)(1) of the Act, 21 U.S.C. section 360bbb-3(b)(1), unless the authorization is terminated or revoked.     Resp Syncytial Virus by PCR NEGATIVE NEGATIVE Final    Comment: (NOTE) Fact Sheet for Patients: BloggerCourse.com  Fact Sheet for Healthcare Providers: SeriousBroker.it  This test is not yet approved or cleared by the [place] FDA and has been authorized for detection and/or diagnosis of SARS-CoV-2 by FDA under an Emergency Use Authorization (EUA). This EUA will remain in effect (meaning this test can be used) for the duration of the COVID-19 declaration under Section 564(b)(1) of the Act, 21 U.S.C. section 360bbb-3(b)(1), unless the authorization is terminated or revoked.  Performed at [facility], [address], [place], [place] [address]   MRSA Next Gen by PCR, Nasal     Status: None   Collection Time: 08/13/22  2:41 AM   Specimen: Nasal Mucosa; Nasal Swab  Result Value Ref Range Status   MRSA by PCR Next Gen NOT DETECTED NOT DETECTED Final    Comment: (NOTE) The GeneXpert MRSA Assay (FDA approved for NASAL specimens only), is one component of a comprehensive MRSA colonization surveillance program. It is not intended to diagnose MRSA infection nor to guide or monitor treatment for MRSA infections. Test performance is not FDA approved in  patients less than 14 years old. Performed at [facility], [address], [place], [place] [address]     Labs: CBC: Recent Labs  Lab 08/15/22 0350 08/16/22 0413 08/17/22 0448  WBC 7.7 6.9 6.0  NEUTROABS 5.5  --   --   HGB 10.0* 10.0* 9.9*  HCT 41.2 38.5* 38.3*  MCV 86.2 81.9 82.2  PLT 175 191 164   Basic Metabolic Panel: Recent Labs  Lab 08/17/22 0448 08/18/22 0403 08/19/22 1443 08/20/22 0409 08/21/22 0413  NA 137 138 137 138 137  K 4.1 4.0 4.3 3.7 4.0  CL 95* 96* 99 98 100  CO2 34* 37* 34* 36* 34*  GLUCOSE 80 91 108* 95 111*  BUN 18 25* 26* 24* 25*  CREATININE 1.25* 1.45* 1.32* 1.41* 1.36*  CALCIUM 8.3* 8.6* 8.3* 8.5* 8.3*   Liver Function Tests: Recent Labs  Lab 08/16/22 0413  AST 11*  ALT 12  ALKPHOS 88  BILITOT 0.5  PROT 7.1  ALBUMIN 2.5*   CBG: No results for input(s): "GLUCAP" in the last 168 hours.  Discharge time spent: greater than 30 minutes.  Signed: Thad Ranger, MD [facility] 08/21/2022

## 2022-08-25 DIAGNOSIS — N1831 Chronic kidney disease, stage 3a: Secondary | ICD-10-CM

## 2022-08-25 DIAGNOSIS — J449 Chronic obstructive pulmonary disease, unspecified: Secondary | ICD-10-CM

## 2022-08-25 DIAGNOSIS — I5033 Acute on chronic diastolic (congestive) heart failure: Secondary | ICD-10-CM

## 2022-08-25 DIAGNOSIS — J9621 Acute and chronic respiratory failure with hypoxia: Secondary | ICD-10-CM

## 2022-08-27 DIAGNOSIS — J449 Chronic obstructive pulmonary disease, unspecified: Secondary | ICD-10-CM

## 2022-08-27 DIAGNOSIS — N1831 Chronic kidney disease, stage 3a: Secondary | ICD-10-CM

## 2022-08-27 DIAGNOSIS — I5033 Acute on chronic diastolic (congestive) heart failure: Secondary | ICD-10-CM

## 2022-08-27 DIAGNOSIS — J9621 Acute and chronic respiratory failure with hypoxia: Secondary | ICD-10-CM

## 2022-08-30 DIAGNOSIS — J9621 Acute and chronic respiratory failure with hypoxia: Secondary | ICD-10-CM

## 2022-08-30 DIAGNOSIS — Z930 Tracheostomy status: Secondary | ICD-10-CM

## 2022-08-31 DIAGNOSIS — J9621 Acute and chronic respiratory failure with hypoxia: Secondary | ICD-10-CM

## 2022-08-31 DIAGNOSIS — Z930 Tracheostomy status: Secondary | ICD-10-CM

## 2022-09-01 DIAGNOSIS — J9621 Acute and chronic respiratory failure with hypoxia: Secondary | ICD-10-CM

## 2022-09-01 DIAGNOSIS — Z930 Tracheostomy status: Secondary | ICD-10-CM

## 2022-09-02 DIAGNOSIS — J9621 Acute and chronic respiratory failure with hypoxia: Secondary | ICD-10-CM

## 2022-09-02 DIAGNOSIS — Z930 Tracheostomy status: Secondary | ICD-10-CM

## 2022-09-03 DIAGNOSIS — J9621 Acute and chronic respiratory failure with hypoxia: Secondary | ICD-10-CM

## 2022-09-03 DIAGNOSIS — I5033 Acute on chronic diastolic (congestive) heart failure: Secondary | ICD-10-CM

## 2022-09-03 DIAGNOSIS — N1831 Chronic kidney disease, stage 3a: Secondary | ICD-10-CM

## 2022-09-03 DIAGNOSIS — Z930 Tracheostomy status: Secondary | ICD-10-CM

## 2022-09-03 DIAGNOSIS — J449 Chronic obstructive pulmonary disease, unspecified: Secondary | ICD-10-CM

## 2022-09-04 DIAGNOSIS — J9621 Acute and chronic respiratory failure with hypoxia: Secondary | ICD-10-CM

## 2022-09-04 DIAGNOSIS — Z930 Tracheostomy status: Secondary | ICD-10-CM

## 2022-09-05 DIAGNOSIS — J9621 Acute and chronic respiratory failure with hypoxia: Secondary | ICD-10-CM

## 2022-09-05 DIAGNOSIS — Z930 Tracheostomy status: Secondary | ICD-10-CM

## 2022-09-07 ENCOUNTER — Other Ambulatory Visit: Payer: Self-pay

## 2022-09-07 ENCOUNTER — Inpatient Hospital Stay (HOSPITAL_COMMUNITY): Payer: Medicare Other

## 2022-09-07 ENCOUNTER — Inpatient Hospital Stay (HOSPITAL_COMMUNITY)
Admission: EM | Admit: 2022-09-07 | Discharge: 2022-09-12 | DRG: 377 | Disposition: A | Discharge status: Rehabilitation Facility | Payer: Medicare Other | Source: Other Acute Inpatient Hospital | Attending: Family Medicine | Admitting: Family Medicine

## 2022-09-07 DIAGNOSIS — Z7401 Bed confinement status: Secondary | ICD-10-CM

## 2022-09-07 DIAGNOSIS — Z9911 Dependence on respirator [ventilator] status: Secondary | ICD-10-CM

## 2022-09-07 DIAGNOSIS — I959 Hypotension, unspecified: Secondary | ICD-10-CM | POA: Diagnosis not present

## 2022-09-07 DIAGNOSIS — K5733 Diverticulitis of large intestine without perforation or abscess with bleeding: Principal | ICD-10-CM | POA: Diagnosis present

## 2022-09-07 DIAGNOSIS — N1832 Chronic kidney disease, stage 3b: Secondary | ICD-10-CM | POA: Diagnosis present

## 2022-09-07 DIAGNOSIS — K2100 Gastro-esophageal reflux disease with esophagitis, without bleeding: Secondary | ICD-10-CM | POA: Diagnosis present

## 2022-09-07 DIAGNOSIS — K641 Second degree hemorrhoids: Secondary | ICD-10-CM | POA: Diagnosis present

## 2022-09-07 DIAGNOSIS — Z87891 Personal history of nicotine dependence: Secondary | ICD-10-CM | POA: Diagnosis not present

## 2022-09-07 DIAGNOSIS — K5791 Diverticulosis of intestine, part unspecified, without perforation or abscess with bleeding: Secondary | ICD-10-CM | POA: Diagnosis present

## 2022-09-07 DIAGNOSIS — D631 Anemia in chronic kidney disease: Secondary | ICD-10-CM | POA: Diagnosis present

## 2022-09-07 DIAGNOSIS — Z823 Family history of stroke: Secondary | ICD-10-CM

## 2022-09-07 DIAGNOSIS — Z6841 Body Mass Index (BMI) 40.0 and over, adult: Secondary | ICD-10-CM

## 2022-09-07 DIAGNOSIS — E785 Hyperlipidemia, unspecified: Secondary | ICD-10-CM | POA: Diagnosis present

## 2022-09-07 DIAGNOSIS — D500 Iron deficiency anemia secondary to blood loss (chronic): Secondary | ICD-10-CM | POA: Diagnosis not present

## 2022-09-07 DIAGNOSIS — E662 Morbid (severe) obesity with alveolar hypoventilation: Secondary | ICD-10-CM | POA: Diagnosis present

## 2022-09-07 DIAGNOSIS — K921 Melena: Secondary | ICD-10-CM | POA: Diagnosis present

## 2022-09-07 DIAGNOSIS — J9622 Acute and chronic respiratory failure with hypercapnia: Secondary | ICD-10-CM | POA: Diagnosis present

## 2022-09-07 DIAGNOSIS — Z980 Intestinal bypass and anastomosis status: Secondary | ICD-10-CM | POA: Diagnosis not present

## 2022-09-07 DIAGNOSIS — K449 Diaphragmatic hernia without obstruction or gangrene: Secondary | ICD-10-CM | POA: Diagnosis present

## 2022-09-07 DIAGNOSIS — Z8711 Personal history of peptic ulcer disease: Secondary | ICD-10-CM

## 2022-09-07 DIAGNOSIS — K219 Gastro-esophageal reflux disease without esophagitis: Secondary | ICD-10-CM | POA: Diagnosis present

## 2022-09-07 DIAGNOSIS — J9611 Chronic respiratory failure with hypoxia: Secondary | ICD-10-CM | POA: Diagnosis not present

## 2022-09-07 DIAGNOSIS — I5032 Chronic diastolic (congestive) heart failure: Secondary | ICD-10-CM | POA: Diagnosis present

## 2022-09-07 DIAGNOSIS — F32A Depression, unspecified: Secondary | ICD-10-CM | POA: Diagnosis present

## 2022-09-07 DIAGNOSIS — N183 Chronic kidney disease, stage 3 unspecified: Secondary | ICD-10-CM | POA: Diagnosis not present

## 2022-09-07 DIAGNOSIS — I110 Hypertensive heart disease with heart failure: Secondary | ICD-10-CM | POA: Diagnosis not present

## 2022-09-07 DIAGNOSIS — K529 Noninfective gastroenteritis and colitis, unspecified: Secondary | ICD-10-CM | POA: Diagnosis not present

## 2022-09-07 DIAGNOSIS — I509 Heart failure, unspecified: Secondary | ICD-10-CM | POA: Diagnosis not present

## 2022-09-07 DIAGNOSIS — I130 Hypertensive heart and chronic kidney disease with heart failure and stage 1 through stage 4 chronic kidney disease, or unspecified chronic kidney disease: Secondary | ICD-10-CM | POA: Diagnosis present

## 2022-09-07 DIAGNOSIS — E876 Hypokalemia: Secondary | ICD-10-CM | POA: Diagnosis present

## 2022-09-07 DIAGNOSIS — J9621 Acute and chronic respiratory failure with hypoxia: Secondary | ICD-10-CM | POA: Diagnosis present

## 2022-09-07 DIAGNOSIS — I428 Other cardiomyopathies: Secondary | ICD-10-CM | POA: Diagnosis present

## 2022-09-07 DIAGNOSIS — Z79899 Other long term (current) drug therapy: Secondary | ICD-10-CM

## 2022-09-07 DIAGNOSIS — I7100 Dissection of unspecified site of aorta: Secondary | ICD-10-CM | POA: Diagnosis present

## 2022-09-07 DIAGNOSIS — K922 Gastrointestinal hemorrhage, unspecified: Secondary | ICD-10-CM | POA: Diagnosis not present

## 2022-09-07 DIAGNOSIS — Z833 Family history of diabetes mellitus: Secondary | ICD-10-CM

## 2022-09-07 DIAGNOSIS — I714 Abdominal aortic aneurysm, without rupture, unspecified: Secondary | ICD-10-CM | POA: Diagnosis present

## 2022-09-07 DIAGNOSIS — D62 Acute posthemorrhagic anemia: Secondary | ICD-10-CM

## 2022-09-07 DIAGNOSIS — I5033 Acute on chronic diastolic (congestive) heart failure: Secondary | ICD-10-CM | POA: Diagnosis not present

## 2022-09-07 DIAGNOSIS — Z7901 Long term (current) use of anticoagulants: Secondary | ICD-10-CM

## 2022-09-07 DIAGNOSIS — J449 Chronic obstructive pulmonary disease, unspecified: Secondary | ICD-10-CM | POA: Diagnosis present

## 2022-09-07 DIAGNOSIS — N1831 Chronic kidney disease, stage 3a: Secondary | ICD-10-CM | POA: Diagnosis present

## 2022-09-07 DIAGNOSIS — K5731 Diverticulosis of large intestine without perforation or abscess with bleeding: Secondary | ICD-10-CM | POA: Diagnosis not present

## 2022-09-07 DIAGNOSIS — Z8616 Personal history of COVID-19: Secondary | ICD-10-CM | POA: Diagnosis not present

## 2022-09-07 DIAGNOSIS — Z86010 Personal history of colonic polyps: Secondary | ICD-10-CM

## 2022-09-07 DIAGNOSIS — I9589 Other hypotension: Secondary | ICD-10-CM | POA: Diagnosis present

## 2022-09-07 DIAGNOSIS — K5730 Diverticulosis of large intestine without perforation or abscess without bleeding: Secondary | ICD-10-CM | POA: Diagnosis not present

## 2022-09-07 DIAGNOSIS — Z930 Tracheostomy status: Secondary | ICD-10-CM | POA: Diagnosis not present

## 2022-09-07 DIAGNOSIS — K5010 Crohn's disease of large intestine without complications: Secondary | ICD-10-CM

## 2022-09-07 DIAGNOSIS — R933 Abnormal findings on diagnostic imaging of other parts of digestive tract: Secondary | ICD-10-CM

## 2022-09-07 DIAGNOSIS — J9601 Acute respiratory failure with hypoxia: Secondary | ICD-10-CM | POA: Diagnosis present

## 2022-09-07 DIAGNOSIS — Z9049 Acquired absence of other specified parts of digestive tract: Secondary | ICD-10-CM

## 2022-09-07 DIAGNOSIS — M1830 Unilateral post-traumatic osteoarthritis of first carpometacarpal joint, unspecified hand: Secondary | ICD-10-CM | POA: Diagnosis not present

## 2022-09-07 DIAGNOSIS — Z86718 Personal history of other venous thrombosis and embolism: Secondary | ICD-10-CM

## 2022-09-07 DIAGNOSIS — K2950 Unspecified chronic gastritis without bleeding: Secondary | ICD-10-CM | POA: Diagnosis not present

## 2022-09-07 LAB — CBC
HCT: 34.5 % — ABNORMAL LOW (ref 39.0–52.0)
Hemoglobin: 9.3 g/dL — ABNORMAL LOW (ref 13.0–17.0)
MCH: 21.4 pg — ABNORMAL LOW (ref 26.0–34.0)
MCHC: 27 g/dL — ABNORMAL LOW (ref 30.0–36.0)
MCV: 79.3 fL — ABNORMAL LOW (ref 80.0–100.0)
Platelets: 168 10*3/uL (ref 150–400)
RBC: 4.35 MIL/uL (ref 4.22–5.81)
RDW: 18.1 % — ABNORMAL HIGH (ref 11.5–15.5)
WBC: 8.2 10*3/uL (ref 4.0–10.5)
nRBC: 0.2 % (ref 0.0–0.2)

## 2022-09-07 LAB — CBC WITH DIFFERENTIAL/PLATELET
Abs Immature Granulocytes: 0.01 10*3/uL (ref 0.00–0.07)
Basophils Absolute: 0 10*3/uL (ref 0.0–0.1)
Basophils Relative: 0 %
Eosinophils Absolute: 0.1 10*3/uL (ref 0.0–0.5)
Eosinophils Relative: 2 %
HCT: 34.3 % — ABNORMAL LOW (ref 39.0–52.0)
Hemoglobin: 8.8 g/dL — ABNORMAL LOW (ref 13.0–17.0)
Immature Granulocytes: 0 %
Lymphocytes Relative: 17 %
Lymphs Abs: 1.5 10*3/uL (ref 0.7–4.0)
MCH: 20.7 pg — ABNORMAL LOW (ref 26.0–34.0)
MCHC: 25.7 g/dL — ABNORMAL LOW (ref 30.0–36.0)
MCV: 80.5 fL (ref 80.0–100.0)
Monocytes Absolute: 0.9 10*3/uL (ref 0.1–1.0)
Monocytes Relative: 10 %
Neutro Abs: 6.2 10*3/uL (ref 1.7–7.7)
Neutrophils Relative %: 71 %
Platelets: 182 10*3/uL (ref 150–400)
RBC: 4.26 MIL/uL (ref 4.22–5.81)
RDW: 18 % — ABNORMAL HIGH (ref 11.5–15.5)
WBC: 8.7 10*3/uL (ref 4.0–10.5)
nRBC: 0 % (ref 0.0–0.2)

## 2022-09-07 LAB — POC OCCULT BLOOD, ED: Fecal Occult Bld: POSITIVE — AB

## 2022-09-07 LAB — COMPREHENSIVE METABOLIC PANEL
ALT: 10 U/L (ref 0–44)
AST: 12 U/L — ABNORMAL LOW (ref 15–41)
Albumin: 2.5 g/dL — ABNORMAL LOW (ref 3.5–5.0)
Alkaline Phosphatase: 93 U/L (ref 38–126)
Anion gap: 7 (ref 5–15)
BUN: 26 mg/dL — ABNORMAL HIGH (ref 8–23)
CO2: 30 mmol/L (ref 22–32)
Calcium: 7.9 mg/dL — ABNORMAL LOW (ref 8.9–10.3)
Chloride: 102 mmol/L (ref 98–111)
Creatinine, Ser: 1.24 mg/dL (ref 0.61–1.24)
GFR, Estimated: >60 mL/min (ref >60)
Glucose, Bld: 120 mg/dL — ABNORMAL HIGH (ref 70–99)
Potassium: 4.4 mmol/L (ref 3.5–5.1)
Sodium: 139 mmol/L (ref 135–145)
Total Bilirubin: 0.2 mg/dL — ABNORMAL LOW (ref 0.3–1.2)
Total Protein: 6.8 g/dL (ref 6.5–8.1)

## 2022-09-07 LAB — TYPE AND SCREEN
ABO/RH(D): A NEG
Antibody Screen: NEGATIVE

## 2022-09-07 LAB — PROTIME-INR
INR: 1.1 (ref 0.8–1.2)
Prothrombin Time: 14.3 seconds (ref 11.4–15.2)

## 2022-09-07 LAB — MRSA NEXT GEN BY PCR, NASAL: MRSA by PCR Next Gen: NOT DETECTED

## 2022-09-07 MED ORDER — ONDANSETRON HCL 4 MG PO TABS: 4.0000 mg | ORAL_TABLET | Freq: Four times a day (QID) | ORAL | Status: DC | PRN

## 2022-09-07 MED ORDER — ORAL CARE MOUTH RINSE
15.0000 mL | OROMUCOSAL | Status: DC
  Administered 2022-09-07 – 2022-09-09 (×10): 15 mL via OROMUCOSAL

## 2022-09-07 MED ORDER — KCL IN DEXTROSE-NACL 10-5-0.45 MEQ/L-%-% IV SOLN
INTRAVENOUS | Status: AC
  Filled 2022-09-07 (×2): qty 1000

## 2022-09-07 MED ORDER — ORAL CARE MOUTH RINSE: 15.0000 mL | OROMUCOSAL | Status: DC | PRN

## 2022-09-07 MED ORDER — CHLORHEXIDINE GLUCONATE CLOTH 2 % EX PADS
6.0000 | MEDICATED_PAD | Freq: Every day | CUTANEOUS | Status: DC
  Administered 2022-09-08 – 2022-09-10 (×2): 6 via TOPICAL

## 2022-09-07 MED ORDER — ONDANSETRON HCL 4 MG/2ML IJ SOLN: 4.0000 mg | Freq: Four times a day (QID) | INTRAMUSCULAR | Status: DC | PRN

## 2022-09-07 MED ORDER — PANTOPRAZOLE SODIUM 40 MG IV SOLR
80.0000 mg | Freq: Once | INTRAVENOUS | Status: AC
  Administered 2022-09-07: 80 mg via INTRAVENOUS
  Filled 2022-09-07: qty 20

## 2022-09-07 MED ORDER — PANTOPRAZOLE SODIUM 40 MG IV SOLR
40.0000 mg | Freq: Two times a day (BID) | INTRAVENOUS | Status: DC
  Administered 2022-09-07 – 2022-09-12 (×10): 40 mg via INTRAVENOUS
  Filled 2022-09-07 (×9): qty 10

## 2022-09-07 MED ORDER — MIDODRINE HCL 5 MG PO TABS
10.0000 mg | ORAL_TABLET | Freq: Three times a day (TID) | ORAL | Status: DC
  Administered 2022-09-08 – 2022-09-12 (×11): 10 mg via ORAL
  Filled 2022-09-07 (×11): qty 2

## 2022-09-07 MED ORDER — SENNOSIDES-DOCUSATE SODIUM 8.6-50 MG PO TABS: 1.0000 | ORAL_TABLET | Freq: Every evening | ORAL | Status: DC | PRN

## 2022-09-07 MED ORDER — DOCUSATE SODIUM 100 MG PO CAPS
100.0000 mg | ORAL_CAPSULE | Freq: Every day | ORAL | Status: DC
  Administered 2022-09-07 – 2022-09-10 (×3): 100 mg via ORAL
  Filled 2022-09-07 (×5): qty 1

## 2022-09-07 MED ORDER — IOHEXOL 350 MG/ML SOLN
100.0000 mL | Freq: Once | INTRAVENOUS | Status: AC | PRN
  Administered 2022-09-07: 100 mL via INTRAVENOUS

## 2022-09-07 MED ORDER — ACETAMINOPHEN 325 MG PO TABS
650.0000 mg | ORAL_TABLET | Freq: Four times a day (QID) | ORAL | Status: DC | PRN
  Administered 2022-09-10: 650 mg via ORAL
  Filled 2022-09-07: qty 2

## 2022-09-07 MED ORDER — ESCITALOPRAM OXALATE 10 MG PO TABS
5.0000 mg | ORAL_TABLET | Freq: Every day | ORAL | Status: DC
  Administered 2022-09-08 – 2022-09-12 (×5): 5 mg via ORAL
  Filled 2022-09-07 (×5): qty 1

## 2022-09-07 NOTE — Progress Notes (Addendum)
eLink Physician-Brief Progress Note Patient Name: MAHARI ZELADA DOB: [birth year] MRN: [id number]   Date of Service  09/07/2022  HPI/Events of Note  67 year old man who presented to [facility] ED from [facility] 5/24 for GIB with dark, tarry stools x 2 days. 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 Recent admission to [facility] 4/28 - 5/7 for SOB/acute-on-chronic hypoxic respiratory failure and HFpEF.    Patient has consistently and continually declined transition from cuffless trach to cuffed trach and consistently refused nighttime ventilation during his most recent admission.  The patient continues to refuse such interventions now.  eICU Interventions  Given that we are unable to exchange his cuffless trach for cuffed trach, would defer nighttime ventilation as its efficacy would be limited.  Will continue to monitor his respiratory status, if he has prolonged periods of apnea, may need to administer sedation to facilitate appropriate mechanical ventilation.   2349 -patient feels that and when he switches his tracheostomy to cuffed trach, he experiences significant gagging and dysphagia.  We talked about how the cough can be manipulated during the daytime versus nighttime to allow him to use the ventilator.  He insist that he wants to try the ventilator, but does not wish to change out his tracheostomy.  For now, patient wants to think about it further but does not wish to perform a trach exchange at this time.  Could consider using the vent without trach exchange although this may be ineffective and counterproductive.  Discussed with RN, RT, and patient at bedside  Intervention Category Intermediate Interventions: Respiratory distress - evaluation and management  Kameryn Davern 09/07/2022, 9:05 PM
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
## 2022-09-07 NOTE — ED Provider Notes (Signed)
[place] EMERGENCY DEPARTMENT AT [facility] Provider Note   CSN: [id number] Arrival date & time: 09/07/22  1451     History  Chief Complaint  Patient presents with   GI Bleeding    Gary [name] is a 67 y.o. male.  HPI    67 y.o. male with medical history significant of HFpEF with EF of 55-60%, chronic respiratory failure s/p tracheostomy, COPD, hypertension, OSA, and previous history of bleed that required embolization comes in with chief complaint of bloody stools.  Patient states that he started noticing dark, bloody stools yesterday.  He had about 3 or 4 episodes yesterday and the same amount today.  No associated abdominal pain.  Patient is not on any blood thinners.   Home Medications Prior to Admission medications   Medication Sig Start Date End Date Taking? Authorizing Provider  acetaminophen (TYLENOL) 325 MG tablet Take 2 tablets (650 mg total) by mouth every 6 (six) hours as needed for mild pain (or Fever >/= 101). 07/25/22   Loyce Dys, MD  apixaban (ELIQUIS) 2.5 MG TABS tablet Take 1 tablet (2.5 mg total) by mouth 2 (two) times daily. 12/13/21   Amin, Loura Halt, MD  docusate sodium (COLACE) 100 MG capsule Take 1 capsule (100 mg total) by mouth daily. 08/22/22   Rai, Ripudeep K, MD  escitalopram (LEXAPRO) 5 MG tablet Take 1 tablet (5 mg total) by mouth daily. 12/14/21   Amin, Loura Halt, MD  ferrous sulfate 325 (65 FE) MG tablet Take 1 tablet (325 mg total) by mouth daily with breakfast. Home med 04/06/22   Darlin Priestly, MD  furosemide (LASIX) 10 MG/ML injection Inject 4 mLs (40 mg total) into the vein 2 (two) times daily. 08/21/22   Rai, Delene Ruffini, MD  hydrOXYzine (ATARAX) 25 MG tablet Take 1 tablet (25 mg total) by mouth 3 (three) times daily as needed for anxiety. 12/13/21   Amin, Huong Luthi Chirag, MD  ipratropium-albuterol (DUONEB) 0.5-2.5 (3) MG/3ML SOLN Take 3 mLs by nebulization every 6 (six) hours as needed. 04/06/22   Darlin Priestly, MD  melatonin 5 MG TABS  Take 1 tablet (5 mg total) by mouth at bedtime. Patient taking differently: Take 5 mg by mouth at bedtime as needed (for sleep). 12/13/21   Amin, Loura Halt, MD  midodrine (PROAMATINE) 10 MG tablet Take 1 tablet (10 mg total) by mouth 3 (three) times daily with meals. 08/21/22   Rai, Delene Ruffini, MD  pantoprazole (PROTONIX) 40 MG tablet Take 1 tablet (40 mg total) by mouth 2 (two) times daily before a meal. 12/13/21   Amin, Loura Halt, MD  polyethylene glycol (MIRALAX / GLYCOLAX) 17 g packet Take 17 g by mouth daily as needed for moderate constipation. 12/13/21   Amin, Loura Halt, MD  potassium chloride SA (KLOR-CON M) 20 MEQ tablet Take 2 tablets (40 mEq total) by mouth 2 (two) times daily. 08/21/22   Rai, Delene Ruffini, MD  traZODone (DESYREL) 50 MG tablet Take 1 tablet (50 mg total) by mouth at bedtime as needed for sleep. 07/25/22   Loyce Dys, MD      Allergies    Patient has no known allergies.    Review of Systems   Review of Systems  All other systems reviewed and are negative.   Physical Exam Updated Vital Signs BP 123/78 (BP Location: Right Arm)   Pulse 90   Temp 98 F (36.7 C) (Oral)   Resp 18   SpO2 98%  Physical  Exam Vitals and nursing note reviewed.  Constitutional:      Appearance: He is well-developed.  HENT:     Head: Atraumatic.  Cardiovascular:     Rate and Rhythm: Normal rate.  Pulmonary:     Effort: Pulmonary effort is normal.  Abdominal:     General: There is distension.     Tenderness: There is no abdominal tenderness.  Musculoskeletal:     Cervical back: Neck supple.  Skin:    General: Skin is warm.  Neurological:     Mental Status: He is alert and oriented to person, place, and time.     ED Results / Procedures / Treatments   Labs (all labs ordered are listed, but only abnormal results are displayed) Labs Reviewed  COMPREHENSIVE METABOLIC PANEL - Abnormal; Notable for the following components:      Result Value   Glucose, Bld 120 (*)    BUN  26 (*)    Calcium 7.9 (*)    Albumin 2.5 (*)    AST 12 (*)    Total Bilirubin 0.2 (*)    All other components within normal limits  CBC WITH DIFFERENTIAL/PLATELET - Abnormal; Notable for the following components:   Hemoglobin 8.8 (*)    HCT 34.3 (*)    MCH 20.7 (*)    MCHC 25.7 (*)    RDW 18.0 (*)    All other components within normal limits  PROTIME-INR  CBC  CBC  MAGNESIUM  PROTIME-INR  BASIC METABOLIC PANEL  POC OCCULT BLOOD, ED  TYPE AND SCREEN    EKG None  Radiology No results found.  Procedures .Critical Care  Performed by: Derwood Kaplan, MD Authorized by: Derwood Kaplan, MD   Critical care provider statement:    Critical care time (minutes):  39   Critical care was necessary to treat or prevent imminent or life-threatening deterioration of the following conditions:  Circulatory failure (Acute hematochezia)   Critical care was time spent personally by me on the following activities:  Development of treatment plan with patient or surrogate, discussions with consultants, evaluation of patient's response to treatment, examination of patient, ordering and review of laboratory studies, ordering and review of radiographic studies, ordering and performing treatments and interventions, pulse oximetry, re-evaluation of patient's condition, review of old charts and obtaining history from patient or surrogate     Medications Ordered in ED Medications  acetaminophen (TYLENOL) tablet 650 mg (has no administration in time range)  docusate sodium (COLACE) capsule 100 mg (has no administration in time range)  escitalopram (LEXAPRO) tablet 5 mg (has no administration in time range)  midodrine (PROAMATINE) tablet 10 mg (has no administration in time range)  pantoprazole (PROTONIX) injection 40 mg (has no administration in time range)  dextrose 5 % and 0.45 % NaCl with KCl 10 mEq/L infusion (has no administration in time range)  ondansetron (ZOFRAN) tablet 4 mg (has no  administration in time range)    Or  ondansetron (ZOFRAN) injection 4 mg (has no administration in time range)  senna-docusate (Senokot-S) tablet 1 tablet (has no administration in time range)  pantoprazole (PROTONIX) injection 80 mg (80 mg Intravenous Given 09/07/22 1623)    ED Course/ Medical Decision Making/ A&P Clinical Course as of 09/07/22 1726  Fri Sep 07, 2022  1725 Adding CT angiogram given that patient already has had 4-5 episodes of hematochezia today.  Again, he is required IR embolization in the past. [AN]    Clinical Course User Index [AN] Derwood Kaplan, MD  Medical Decision Making Amount and/or Complexity of Data Reviewed Labs: ordered.  Risk Prescription drug management. Decision regarding hospitalization.   This patient presents to the ED with chief complaint(s) of bloody stools with pertinent past medical history of chronic respiratory failure status post trach placement, history of GI bleed that required embolization and multiple transfusions and CHF.The complaint involves an extensive differential diagnosis and also carries with it a high risk of complications and morbidity.    The differential diagnosis includes : Esophagitis [place]  Variceal bleeding PUD/Gastritis/ulcers Diverticular bleed Colon cancer Rectal bleed Internal hemorrhoids External hemorrhoids  The initial plan is to get basic labs.  Patient hemodynamically stable at this time.  He has known history of diverticulosis, likely PUD.  High risk candidate.  Patient will need admission to the hospital.   Additional history obtained: Records reviewed previous admission documents and discharge summary from 12-13-2021 that goes over the GI bleed.  I have also reviewed patient's sigmoidoscopy and IR embolization procedure from 2023  Independent labs interpretation:  The following labs were independently interpreted: Hemoglobin today is 8.8.  Down from 10.  BUN is at  reassuring level.   Treatment and Reassessment: I went to complete a digital rectal exam.  Patient however informed me that he just had a BM over the bedpan.  On evaluation, the bedpan has small volume of hematochezia.  No melena seen.  Consultation: - Consulted or discussed management/test interpretation with external professional: Spoke with Dr. Adela Lank, [facility].  Patient is on their list.  Consideration for admission or further workup:  Social Determinants of health:  Final Clinical Impression(s) / ED Diagnoses Final diagnoses:  Hematochezia  Lower GI bleed    Rx / DC Orders ED Discharge Orders     None         Derwood Kaplan, MD 09/07/22 1726

## 2022-09-07 NOTE — ED Triage Notes (Signed)
Pt BIB [facility] from [facility] d/t lower GI Bleed that has been going on for 2 days.Pt reported dark tarry stools. Pt was just recently dc'ed from here.  Pt has a trach on. A7O X4/ .Hgb reported by facility was 10.2.

## 2022-09-07 NOTE — ED Notes (Signed)
Pt stated he can not stand  for orthostatic vitals
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
## 2022-09-08 DIAGNOSIS — E662 Morbid (severe) obesity with alveolar hypoventilation: Secondary | ICD-10-CM | POA: Diagnosis not present

## 2022-09-08 DIAGNOSIS — K922 Gastrointestinal hemorrhage, unspecified: Secondary | ICD-10-CM | POA: Diagnosis not present

## 2022-09-08 DIAGNOSIS — D62 Acute posthemorrhagic anemia: Secondary | ICD-10-CM

## 2022-09-08 DIAGNOSIS — J9611 Chronic respiratory failure with hypoxia: Secondary | ICD-10-CM

## 2022-09-08 DIAGNOSIS — N1831 Chronic kidney disease, stage 3a: Secondary | ICD-10-CM

## 2022-09-08 DIAGNOSIS — D500 Iron deficiency anemia secondary to blood loss (chronic): Secondary | ICD-10-CM

## 2022-09-08 DIAGNOSIS — Z6841 Body Mass Index (BMI) 40.0 and over, adult: Secondary | ICD-10-CM | POA: Diagnosis not present

## 2022-09-08 DIAGNOSIS — I5032 Chronic diastolic (congestive) heart failure: Secondary | ICD-10-CM | POA: Diagnosis not present

## 2022-09-08 DIAGNOSIS — N183 Chronic kidney disease, stage 3 unspecified: Secondary | ICD-10-CM | POA: Diagnosis not present

## 2022-09-08 HISTORY — DX: Acute posthemorrhagic anemia: D62

## 2022-09-08 LAB — CBC
HCT: 34.4 % — ABNORMAL LOW (ref 39.0–52.0)
HCT: 35.2 % — ABNORMAL LOW (ref 39.0–52.0)
Hemoglobin: 9.1 g/dL — ABNORMAL LOW (ref 13.0–17.0)
Hemoglobin: 9.3 g/dL — ABNORMAL LOW (ref 13.0–17.0)
MCH: 21 pg — ABNORMAL LOW (ref 26.0–34.0)
MCH: 20.9 pg — ABNORMAL LOW (ref 26.0–34.0)
MCHC: 26.5 g/dL — ABNORMAL LOW (ref 30.0–36.0)
MCHC: 26.4 g/dL — ABNORMAL LOW (ref 30.0–36.0)
MCV: 79.4 fL — ABNORMAL LOW (ref 80.0–100.0)
MCV: 78.9 fL — ABNORMAL LOW (ref 80.0–100.0)
Platelets: 165 10*3/uL (ref 150–400)
Platelets: 164 10*3/uL (ref 150–400)
RBC: 4.33 MIL/uL (ref 4.22–5.81)
RBC: 4.46 MIL/uL (ref 4.22–5.81)
RDW: 18.3 % — ABNORMAL HIGH (ref 11.5–15.5)
RDW: 18.3 % — ABNORMAL HIGH (ref 11.5–15.5)
WBC: 6.3 10*3/uL (ref 4.0–10.5)
WBC: 5.4 10*3/uL (ref 4.0–10.5)
nRBC: 0 % (ref 0.0–0.2)
nRBC: 0.4 % — ABNORMAL HIGH (ref 0.0–0.2)

## 2022-09-08 LAB — BASIC METABOLIC PANEL
Anion gap: 8 (ref 5–15)
BUN: 23 mg/dL (ref 8–23)
CO2: 28 mmol/L (ref 22–32)
Calcium: 8.4 mg/dL — ABNORMAL LOW (ref 8.9–10.3)
Chloride: 101 mmol/L (ref 98–111)
Creatinine, Ser: 1.27 mg/dL — ABNORMAL HIGH (ref 0.61–1.24)
GFR, Estimated: >60 mL/min (ref >60)
Glucose, Bld: 111 mg/dL — ABNORMAL HIGH (ref 70–99)
Potassium: 4.3 mmol/L (ref 3.5–5.1)
Sodium: 137 mmol/L (ref 135–145)

## 2022-09-08 LAB — CBC WITH DIFFERENTIAL/PLATELET
Abs Immature Granulocytes: 0.1 10*3/uL — ABNORMAL HIGH (ref 0.00–0.07)
Basophils Absolute: 0.1 10*3/uL (ref 0.0–0.1)
Basophils Relative: 1 %
Eosinophils Absolute: 0.2 10*3/uL (ref 0.0–0.5)
Eosinophils Relative: 3 %
HCT: 31.5 % — ABNORMAL LOW (ref 39.0–52.0)
Hemoglobin: 8.9 g/dL — ABNORMAL LOW (ref 13.0–17.0)
Immature Granulocytes: 1 %
Lymphocytes Relative: 20 %
Lymphs Abs: 1.4 10*3/uL (ref 0.7–4.0)
MCH: 21.7 pg — ABNORMAL LOW (ref 26.0–34.0)
MCHC: 28.3 g/dL — ABNORMAL LOW (ref 30.0–36.0)
MCV: 76.6 fL — ABNORMAL LOW (ref 80.0–100.0)
Monocytes Absolute: 0.8 10*3/uL (ref 0.1–1.0)
Monocytes Relative: 11 %
Neutro Abs: 4.8 10*3/uL (ref 1.7–7.7)
Neutrophils Relative %: 64 %
Platelets: 168 10*3/uL (ref 150–400)
RBC: 4.11 MIL/uL — ABNORMAL LOW (ref 4.22–5.81)
RDW: 18.1 % — ABNORMAL HIGH (ref 11.5–15.5)
WBC: 7.3 10*3/uL (ref 4.0–10.5)
nRBC: 0.5 % — ABNORMAL HIGH (ref 0.0–0.2)

## 2022-09-08 LAB — MAGNESIUM: Magnesium: 2.1 mg/dL (ref 1.7–2.4)

## 2022-09-08 LAB — PROTIME-INR
INR: 1.1 (ref 0.8–1.2)
Prothrombin Time: 14.3 seconds (ref 11.4–15.2)

## 2022-09-08 MED ORDER — PEG-KCL-NACL-NASULF-NA ASC-C 100 G PO SOLR
0.5000 | Freq: Once | ORAL | Status: AC
  Administered 2022-09-09: 100 g via ORAL
  Filled 2022-09-08: qty 1

## 2022-09-08 MED ORDER — METRONIDAZOLE 500 MG/100ML IV SOLN
500.0000 mg | Freq: Two times a day (BID) | INTRAVENOUS | Status: DC
  Administered 2022-09-08 – 2022-09-12 (×9): 500 mg via INTRAVENOUS
  Filled 2022-09-08 (×9): qty 100

## 2022-09-08 MED ORDER — PEG-KCL-NACL-NASULF-NA ASC-C 100 G PO SOLR
0.5000 | Freq: Once | ORAL | Status: AC
  Administered 2022-09-08: 100 g via ORAL
  Filled 2022-09-08: qty 1

## 2022-09-08 MED ORDER — PEG-KCL-NACL-NASULF-NA ASC-C 100 G PO SOLR: 1.0000 | Freq: Once | ORAL | Status: DC

## 2022-09-08 MED ORDER — SODIUM CHLORIDE 0.9 % IV SOLN
2.0000 g | INTRAVENOUS | Status: DC
  Administered 2022-09-08 – 2022-09-12 (×5): 2 g via INTRAVENOUS
  Filled 2022-09-08 (×5): qty 20
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
## 2022-09-08 NOTE — Progress Notes (Signed)
PROGRESS NOTE    Gary [name]  UJW:[id number] DOB: Sep 02, 1955 DOA: 09/07/2022 PCP: [facility]   Brief Narrative:  Gary [name]  is a 67 y.o. 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  No reports of abdominal pain, fever, chills or hematemesis.  He is largely bedbound but can walk a few steps with a walker.  He was admitted to hospitalist service and GI, IR as well as PCCM were consulted.  Assessment & Plan:   Principal Problem:   GI bleed Active Problems:   Acute on chronic diastolic (congestive) heart failure ([name])   Chronic kidney disease, stage 3a ([name])   Chronic obstructive pulmonary disease (COPD) ([name])   Morbid obesity with BMI of 50.0-59.9, adult ([name])   GERD without esophagitis   HLD (hyperlipidemia)   Hyperlipidemia   Acute respiratory failure with hypoxia and hypercapnia ([name])  Bright red blood per rectum/anemia of chronic disease: Patient's baseline hemoglobin appears to be around 10, currently 9.1.  Not much drop in hemoglobin.  CTA negative for active bleed.  No intervention by IR needed at this point in time.  CT abdomen however shows acute on chronic diverticulitis, some concern of malignancy.  Seen by GI, plans for EGD and sigmoidoscopy tomorrow morning.  For now, will empirically start on antibiotics, Rocephin and Flagyl.  ?  Abdominal aortic  aneurysm vs microdissection: Does not appear to be urgent.  And likely chronic finding.  However to be sure, I tried to reach Dr. Edilia Bo of vascular surgery but he was tied up with the surgery.  Awaiting a phone call.  Morbid obesity with history of OSA as well as chronic hypoxic and hypercapnic respiratory failure, chronically on vent: Apparently, he has not been connected to the vent or required any vent since he has been discharged to [facility] few weeks ago.  PCCM has verified that.  Overnight, he also consistently refused to transition from cuffless to cuffed trach and refused nighttime ventilation.  PCCM is transferring him to medical floor.    CKD stage IIIa: At baseline.  Chronic diastolic CHF: Recent echo showed ejection fraction of 65%.  Appears euvolemic.  Monitor for now.  History of hypotension: Continue midodrine.  History of DVT: Eliquis on hold due to bright red blood per rectum.  History of depression: Continue Lexapro .  DVT close renal  DVT prophylaxis: SCDs Start: 09/07/22 1720   Code Status: Full Code  Family Communication:  None present at bedside.  Plan of care discussed with patient in length and he/she verbalized understanding and agreed with it.  Status is: Inpatient Remains inpatient appropriate because: Scheduled for EGD and sigmoidoscopy tomorrow morning.   Estimated body mass index is 35.41 kg/m as calculated from the following:   Height as of 08/12/22: 6\' 3"  (1.905 m).   Weight as of this encounter: 128.5 kg.    Nutritional Assessment: Body mass index is 35.41 kg/m.[name]  Seen by dietician.  I agree with the assessment and plan as outlined below: Nutrition Status:        . Skin Assessment: I have examined the patient's skin and I agree with the wound assessment as performed by the wound care RN as outlined below:    Consultants:  GI and PCCM  Procedures:  As above  Antimicrobials:  Anti-infectives (From admission, onward)    None          Subjective: Patient seen and examined.  He has no complaints.  No further reports of bright red blood per rectum since the one he had in the emergency department.  Objective: Vitals:   09/08/22 0410 09/08/22 0430 09/08/22 0500 09/08/22 0600  BP: 107/60   (!) 108/58  Pulse: 85  81 84  Resp: (!) 26  (!) 26 (!) 26  Temp:      TempSrc:      SpO2: 90% 91% 96% 96%  Weight:        Intake/Output Summary (Last 24 hours) at 09/08/2022 0720 Last data filed at 09/08/2022 0700 Gross per 24 hour  Intake 614.73 ml  Output 175 ml  Net 439.73 ml   Filed Weights   09/07/22 1948  Weight: 128.5 kg    Examination:  General exam: Appears calm and comfortable, on trach collar, morbidly obese Respiratory system: Clear to auscultation. Respiratory effort normal. Cardiovascular system: S1 & S2 heard, RRR. No JVD, murmurs, rubs, gallops or clicks. No pedal edema. Gastrointestinal system: Abdomen is nondistended, soft and nontender. No organomegaly or masses felt. Normal bowel sounds heard. Central nervous system: Alert and oriented. No focal neurological deficits. Extremities: Symmetric 5 x 5 power. Skin: No rashes, lesions or ulcers Psychiatry: Judgement and insight appear normal. Mood & affect appropriate.    Data Reviewed: I have personally reviewed following labs and imaging studies  CBC: Recent Labs  Lab 09/07/22 1548 09/07/22 1954 09/08/22 0336  WBC 8.7 8.2 6.3  NEUTROABS 6.2  --   --   HGB 8.8* 9.3* 9.1*  HCT 34.3* 34.5* 34.4*  MCV 80.5 79.3* 79.4*  PLT 182 168 165   Basic Metabolic Panel: Recent Labs  Lab 09/07/22 1548 09/08/22 0336  NA 139 137  K 4.4 4.3  CL 102 101  CO2 30 28  GLUCOSE 120* 111*  BUN 26* 23  CREATININE 1.24 1.27*  CALCIUM 7.9* 8.4*  MG  --  2.1   GFR: Estimated Creatinine Clearance: 83.7 mL/min (A) (by C-G formula based on SCr of 1.27 mg/dL (H)). Liver Function Tests: Recent Labs  Lab 09/07/22 1548  AST 12*  ALT 10  ALKPHOS 93  BILITOT  0.2*  PROT 6.8  ALBUMIN 2.5*   No results for input(s): "LIPASE", "AMYLASE" in the last 168 hours. No results for input(s): "AMMONIA" in the last 168 hours. Coagulation Profile: Recent Labs  Lab 09/07/22 1548 09/08/22 0336  INR 1.1 1.1   Cardiac Enzymes: No results for input(s): "CKTOTAL", "CKMB", "CKMBINDEX", "TROPONINI" in the last 168 hours. BNP (last 3 results) No results for input(s): "PROBNP" in the last 8760 hours. HbA1C: No results for input(s): "HGBA1C" in the last 72 hours. CBG: No results for input(s): "GLUCAP" in the last 168 hours. Lipid Profile: No results for input(s): "CHOL", "HDL", "LDLCALC", "TRIG", "CHOLHDL", "LDLDIRECT" in the last 72 hours. Thyroid Function Tests: No results for input(s): "TSH", "T4TOTAL", "FREET4", "T3FREE", "THYROIDAB" in the last 72 hours. Anemia Panel: No results for input(s): "VITAMINB12", "FOLATE", "FERRITIN", "TIBC", "IRON", "RETICCTPCT" in  the last 72 hours. Sepsis Labs: No results for input(s): "PROCALCITON", "LATICACIDVEN" in the last 168 hours.  Recent Results (from the past 240 hour(s))  MRSA Next Gen by PCR, Nasal     Status: None   Collection Time: 09/07/22  9:14 PM   Specimen: Nasal Mucosa; Nasal Swab  Result Value Ref Range Status   MRSA by PCR Next Gen NOT DETECTED NOT DETECTED Final    Comment: (NOTE) The GeneXpert MRSA Assay (FDA approved for NASAL specimens only), is one component of a comprehensive MRSA colonization surveillance program. It is not intended to diagnose MRSA infection nor to guide or monitor treatment for MRSA infections. Test performance is not FDA approved in patients less than 37 years old. Performed at [facility], [address] [place], [place] [address]      Radiology Studies: CT ANGIO GI BLEED  Result Date: 09/07/2022 CLINICAL DATA:  Hematochezia * Tracking Code: BO * EXAM: CTA ABDOMEN AND PELVIS WITHOUT AND WITH CONTRAST TECHNIQUE: Multidetector CT imaging of the abdomen and  pelvis was performed using the standard protocol during bolus administration of intravenous contrast. Multiplanar reconstructed images and MIPs were obtained and reviewed to evaluate the vascular anatomy. RADIATION DOSE REDUCTION: This exam was performed according to the departmental dose-optimization program which includes automated exposure control, adjustment of the mA and/or kV according to patient size and/or use of iterative reconstruction technique. CONTRAST:  OMNIPAQUE IOHEXOL 350 MG/ML SOLN COMPARISON:  11/16/2021 FINDINGS: VASCULAR Unchanged contour and caliber of the abdominal aorta. Focal, aneurysmal dissection or chronic penetrating ulceration of the infrarenal abdominal aorta measuring up to 3.1 x 2.3 cm in caliber (series 10, image 131). Standard branching pattern of the abdominal aorta, with solitary bilateral renal arteries. Mild mixed calcific atherosclerosis. Review of the MIP images confirms the above findings. NON-VASCULAR Lower chest: Scarring or atelectasis of the bilateral lung bases. Coronary artery calcifications. Hepatobiliary: No solid liver abnormality is seen. Gallstones contracted in the gallbladder. No gallbladder wall thickening, or biliary dilatation. Pancreas: Unremarkable. No pancreatic ductal dilatation or surrounding inflammatory changes. Spleen: Normal in size without significant abnormality. Adrenals/Urinary Tract: Unchanged, definitively benign small right adrenal adenomata, for which no further follow-up or characterization is required. Punctuate nonobstructive calculus of the inferior pole of the left kidney (series 8, image 69). No right-sided calculi, ureteral calculi, or hydronephrosis. Bladder is unremarkable. Stomach/Bowel: Stomach is within normal limits. Status post right hemicolectomy and reanastomosis. Descending and sigmoid diverticula. Unchanged, markedly thickened and tethered appearing loop of distal descending colon and proximal sigmoid colon in the left  lower quadrant (series 10, image 162). Unchanged enlarged lymph node adjacent to the left aspect of the proximal sigmoid colon measuring 1.1 x 0.8 cm (series 17, image 23). Lymphatic: No other enlarged abdominal or pelvic lymph nodes. Reproductive: No mass or other significant abnormality. Other: Fat containing umbilical hernia (series 10, image 195) no ascites. Musculoskeletal: No acute or significant osseous findings. IMPRESSION: 1. Descending and sigmoid diverticula. Unchanged, markedly thickened and tethered appearing loop of distal descending colon and proximal sigmoid colon in the left lower quadrant. Unchanged enlarged lymph node adjacent to the left aspect of the proximal sigmoid colon. Findings are most suggestive of acute on chronic diverticulitis, however very focal and thickened appearance remains worrisome for underlying malignancy. Consider endoscopy if not already performed based on findings of prior examination. 2. Status post right hemicolectomy and reanastomosis. 3. Unchanged contour and caliber of the abdominal aorta. Focal, aneurysmal dissection or chronic penetrating ulceration of  the infrarenal abdominal aorta measuring up to 3.1 x 2.3 cm in caliber. Recommend referral to a vascular specialist if not already obtained. This recommendation follows ACR consensus guidelines: White Paper of the ACR Incidental Findings Committee II on Vascular Findings. J Am Coll Radiol 2013; 16:109-604. 4. Coronary artery disease. 5. Cholelithiasis. 6. Nonobstructive left nephrolithiasis. Aortic Atherosclerosis (ICD10-I70.0). Electronically Signed   By: Jearld Lesch M.D.   On: 09/07/2022 19:11    Scheduled Meds:  Chlorhexidine Gluconate Cloth  6 each Topical Daily   docusate sodium  100 mg Oral Daily   escitalopram  5 mg Oral Daily   midodrine  10 mg Oral TID WC   mouth rinse  15 mL Mouth Rinse Q2H   pantoprazole (PROTONIX) IV  40 mg Intravenous Q12H   Continuous Infusions:  dextrose 5 % and 0.45 % NaCl  with KCl 10 mEq/L 50 mL/hr at 09/08/22 0700     LOS: 1 day   Hughie Closs, MD [facility]  09/08/2022, 7:20 AM   *Please note that this is a verbal dictation therefore any spelling or grammatical errors are due to the "Dragon Medical One" system interpretation.  Please page via Amion and do not message via secure chat for urgent patient care matters. Secure chat can be used for non urgent patient care matters.  How to contact the [facility] Attending or Consulting provider 7A - 7P or covering provider during after hours 7P -7A, for this patient?  Check the care team in [facility] and look for a) attending/consulting TR[facility] provider listed and b) the [facility] team listed. Page or secure chat 7A-7P. Log into [email or web address] and use [place]'s universal password to access. If you do not have the password, please contact the hospital operator. Locate the [facility] provider you are looking for under [facility] and page to a number that you can be directly reached. If you still have difficulty reaching the provider, please page the [facility] (Director on Call) for the Hospitalists listed on amion for assistance.
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
## 2022-09-08 NOTE — Progress Notes (Signed)
PT Cancellation Note  Patient Details Name: Gary [name] MRN: [id number] DOB: 06-18-1955   Cancelled Treatment:    Reason Eval/Treat Not Completed: Patient declined, no reason specified (pt given prior notice therapy was coming and at what time. pt refused mobility today stating "I got too many things going on" "diarrhea". Pt stating no need to attempt next date and that he will agree to get up 5/27. Pt reports he has been walking at [facility])   Tiara Maultsby B Deshara Rossi 09/08/2022, 9:54 AM Merryl Hacker, PT Acute Rehabilitation Services Office: [phone]
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
## 2022-09-08 NOTE — Progress Notes (Signed)
OT Cancellation Note  Patient Details Name: Gary [name] MRN: [id number] DOB: 1956-04-15   Cancelled Treatment:    Reason Eval/Treat Not Completed: Patient declined, no reason specified. Pt provided notice for therapy arrival time; but ultimately refusing therapy reporting he has things to take care of and having diarrhea. Pt reports he has been ambulating at [facility] with therapy and will be agreeable to OOB Monday.   [name], OTR/L [facility] Acute Rehabilitation Office: [phone]   [name] 09/08/2022, 1:16 PM

## 2022-09-09 ENCOUNTER — Encounter (HOSPITAL_COMMUNITY)
Admission: EM | Disposition: A | Discharge status: Rehabilitation Facility | Payer: Self-pay | Source: Other Acute Inpatient Hospital | Attending: Family Medicine

## 2022-09-09 ENCOUNTER — Inpatient Hospital Stay (HOSPITAL_COMMUNITY): Payer: Medicare Other | Admitting: Certified Registered Nurse Anesthetist

## 2022-09-09 ENCOUNTER — Encounter (HOSPITAL_COMMUNITY): Payer: Self-pay | Admitting: Internal Medicine

## 2022-09-09 DIAGNOSIS — E662 Morbid (severe) obesity with alveolar hypoventilation: Secondary | ICD-10-CM

## 2022-09-09 DIAGNOSIS — K2100 Gastro-esophageal reflux disease with esophagitis, without bleeding: Secondary | ICD-10-CM

## 2022-09-09 DIAGNOSIS — I110 Hypertensive heart disease with heart failure: Secondary | ICD-10-CM

## 2022-09-09 DIAGNOSIS — M1830 Unilateral post-traumatic osteoarthritis of first carpometacarpal joint, unspecified hand: Secondary | ICD-10-CM

## 2022-09-09 DIAGNOSIS — K922 Gastrointestinal hemorrhage, unspecified: Secondary | ICD-10-CM | POA: Diagnosis not present

## 2022-09-09 DIAGNOSIS — I5032 Chronic diastolic (congestive) heart failure: Secondary | ICD-10-CM | POA: Diagnosis not present

## 2022-09-09 DIAGNOSIS — K449 Diaphragmatic hernia without obstruction or gangrene: Secondary | ICD-10-CM | POA: Diagnosis not present

## 2022-09-09 DIAGNOSIS — J9622 Acute and chronic respiratory failure with hypercapnia: Secondary | ICD-10-CM

## 2022-09-09 DIAGNOSIS — Z87891 Personal history of nicotine dependence: Secondary | ICD-10-CM

## 2022-09-09 DIAGNOSIS — I509 Heart failure, unspecified: Secondary | ICD-10-CM

## 2022-09-09 HISTORY — PX: ESOPHAGOGASTRODUODENOSCOPY (EGD) WITH PROPOFOL: SHX5813

## 2022-09-09 SURGERY — ESOPHAGOGASTRODUODENOSCOPY (EGD) WITH PROPOFOL
Anesthesia: General

## 2022-09-09 MED ORDER — PROPOFOL 10 MG/ML IV BOLUS
INTRAVENOUS | Status: DC | PRN
  Administered 2022-09-09: 20 mg via INTRAVENOUS

## 2022-09-09 MED ORDER — FENTANYL CITRATE (PF) 100 MCG/2ML IJ SOLN
INTRAMUSCULAR | Status: AC
  Filled 2022-09-09: qty 2

## 2022-09-09 MED ORDER — SODIUM CHLORIDE 0.9 % IV BOLUS: 500.0000 mL | Freq: Once | INTRAVENOUS | Status: DC

## 2022-09-09 MED ORDER — PROPOFOL 500 MG/50ML IV EMUL
INTRAVENOUS | Status: DC | PRN
  Administered 2022-09-09: 180 ug/kg/min via INTRAVENOUS

## 2022-09-09 MED ORDER — HYDROXYZINE HCL 25 MG PO TABS: 25.0000 mg | ORAL_TABLET | Freq: Three times a day (TID) | ORAL | Status: DC | PRN

## 2022-09-09 MED ORDER — SODIUM CHLORIDE 0.9 % IV SOLN: INTRAVENOUS | Status: DC

## 2022-09-09 MED ORDER — LACTATED RINGERS IV SOLN: INTRAVENOUS | Status: DC

## 2022-09-09 MED ORDER — LIDOCAINE 2% (20 MG/ML) 5 ML SYRINGE
INTRAMUSCULAR | Status: DC | PRN
  Administered 2022-09-09: 40 mg via INTRAVENOUS

## 2022-09-09 MED ORDER — FUROSEMIDE 10 MG/ML IJ SOLN
40.0000 mg | Freq: Two times a day (BID) | INTRAMUSCULAR | Status: DC
  Administered 2022-09-09 – 2022-09-12 (×7): 40 mg via INTRAVENOUS
  Filled 2022-09-09 (×7): qty 4

## 2022-09-09 MED ORDER — TRAZODONE HCL 50 MG PO TABS
50.0000 mg | ORAL_TABLET | Freq: Every evening | ORAL | Status: DC | PRN
  Filled 2022-09-09: qty 1

## 2022-09-09 SURGICAL SUPPLY — 15 items

## 2022-09-09 NOTE — TOC Initial Note (Signed)
Transition of Care [facility]) - Initial/Assessment Note    Patient Details  Name: Gary [name] MRN: [id number] Date of Birth: 09/11/1955  [facility] CM/SW Contact:    Lawerance Sabal, RN Phone Number: 09/09/2022, 9:34 AM  Clinical Narrative:                 Patient with chronic trach, trach collar dependent in the morning and trilogy vent at night  Patient admitted from [facility].  They made referral to [facility] [phone] on 5/24.  T[facility] at [place] states they have a bed pending for him and are able to accept, tentative DC date 5/28.   Expected Discharge Plan: IP Rehab Facility Barriers to Discharge: Continued Medical Work up   Patient Goals and CMS Choice            Expected Discharge Plan and Services                                              Prior Living Arrangements/Services                       Activities of Daily Living Home Assistive Devices/Equipment: Vent/Trach supplies ADL Screening (condition at time of admission) Is the patient deaf or have difficulty hearing?: No Does the patient have difficulty seeing, even when wearing glasses/contacts?: No Does the patient have difficulty concentrating, remembering, or making decisions?: No Does the patient have difficulty dressing or bathing?: No Does the patient have difficulty walking or climbing stairs?: No  Permission Sought/Granted                  Emotional Assessment              Admission diagnosis:  Hematochezia [K92.1] GI bleed [K92.2] Lower GI bleed [K92.2] Patient Active Problem List   Diagnosis Date Noted   Anemia, posthemorrhagic, acute 09/08/2022   GI bleed 09/07/2022   Chest pain 08/13/2022   Acute on chronic heart failure with preserved ejection fraction (HFpEF) ([name]) 08/12/2022   Acute on chronic hypoxic respiratory failure ([name]) 07/10/2022   Acute exacerbation of CHF (congestive heart failure) ([name]) 04/11/2022   Influenza A 04/11/2022    Hypokalemia 03/06/2022   Acute on chronic diastolic (congestive) heart failure ([name]) 02/03/2022   Chronic obstructive pulmonary disease (COPD) ([name]) 02/03/2022   GERD without esophagitis 02/03/2022   Anxiety and depression 02/03/2022   Dyslipidemia 02/03/2022   Bloody stool 11/12/2021   Major depressive disorder, recurrent episode, moderate ([name]) 10/22/2021   Pressure injury of skin 09/27/2021   Acute on chronic respiratory failure with hypercapnia ([name]) 08/07/2021   COPD (chronic obstructive pulmonary disease) ([name])    HLD (hyperlipidemia)    Iron deficiency anemia    Chronic kidney disease, stage 3a ([name]) 07/22/2021   Acute on chronic respiratory failure with hypoxia and hypercapnia ([name]) 07/20/2021   Chronic obstructive pulmonary disease ([name])    Acute CHF (congestive heart failure) ([name]) 04/04/2021   Weakness    Acute respiratory failure with hypoxia and hypercapnia ([name]) 03/11/2021   (HFpEF) heart failure with preserved ejection fraction ([name])    Chronic respiratory failure with hypoxia ([name])    Hyperlipidemia 02/19/2021   Acute respiratory distress syndrome (ARDS) due to COVID-19 virus ([name])    Acute on chronic respiratory failure ([name]) 02/18/2021   Generalized osteoarthritis of multiple sites 10/31/2020  AKI (acute kidney injury) ([name]) 03/04/2020   Hyperkalemia 03/04/2020   COPD with acute exacerbation ([name]) 03/02/2020   Marijuana use 01/17/2020   Thrombocytopenia ([name]) 01/17/2020   Positive hepatitis C antibody test 01/17/2020   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 mass index (BMI) of 50.0 to 59.9 in adult [facility]) 01/17/2020   Osteoarthritis of glenohumeral joints (Bilateral) 01/17/2020   Osteoarthritis of AC (acromioclavicular) joints (Bilateral) 01/17/2020   Polysubstance abuse ([name]) 08/25/2019   Toxic metabolic encephalopathy 08/25/2019   Chronic pain syndrome 07/14/2019   Anticoagulated 07/14/2019   DDD (degenerative disc disease), lumbosacral  07/14/2019   DDD (degenerative disc disease), cervical 07/14/2019   CHF (congestive heart failure) ([name]) 04/12/2017   Acute on chronic diastolic CHF (congestive heart failure) ([name]) 02/28/2015   OSA (obstructive sleep apnea) 02/28/2015   Acute on chronic diastolic congestive heart failure ([name])    Acute on chronic respiratory failure with hypoxia ([name])    Morbid obesity with BMI of 50.0-59.9, adult ([name]) 04/01/2014   HTN (hypertension) 04/01/2014   PCP:  [facility]:   [facility] [place], [place] - [address] [place] [address] Phone: [phone] Fax: [phone]     Social Determinants of Health (SDOH) Social History: SDOH Screenings   Food Insecurity: No Food Insecurity (09/07/2022)  Housing: Low Risk  (09/07/2022)  Transportation Needs: Unmet Transportation Needs (08/13/2022)  Utilities: Not At Risk (08/13/2022)  Depression (PHQ2-9): Low Risk  (06/07/2021)  Financial Resource Strain: Medium Risk (05/03/2017)  Physical Activity: Insufficiently Active (05/03/2017)  Social Connections: Moderately Integrated (05/03/2017)  Stress: No Stress Concern Present (05/03/2017)  Tobacco Use: Medium Risk (04/26/2022)   SDOH Interventions:     Readmission Risk Interventions    12/31/2021    8:58 AM 12/20/2021    4:21 PM 05/04/2021    1:26 PM  Readmission Risk Prevention Plan  Transportation Screening Complete Complete Complete  Medication Review [employer]) Complete Complete Complete  PCP or Specialist appointment within 3-5 days of discharge Complete Complete Complete  HRI or Home Care Consult Complete Complete Complete  SW Recovery Care/Counseling Consult Complete Complete Complete  Palliative Care Screening Complete Complete Not Applicable  Skilled Nursing Facility Not Applicable Not Applicable Not Applicable

## 2022-09-09 NOTE — Anesthesia Postprocedure Evaluation (Signed)
Anesthesia Post Note  Patient: Gary [name]  Procedure(s) Performed: FLEXIBLE SIGMOIDOSCOPY ESOPHAGOGASTRODUODENOSCOPY (EGD) WITH PROPOFOL     Patient location during evaluation: PACU Anesthesia Type: MAC Level of consciousness: awake Pain management: pain level controlled Vital Signs Assessment: post-procedure vital signs reviewed and stable Respiratory status: spontaneous breathing, nonlabored ventilation and respiratory function stable Cardiovascular status: blood pressure returned to baseline and stable Postop Assessment: no apparent nausea or vomiting Anesthetic complications: no   No notable events documented.  Last Vitals:  Vitals:   09/09/22 1518 09/09/22 1747  BP: 112/70   Pulse: 84 80  Resp: 17 18  Temp: 36.7 C   SpO2: 90% 92%    Last Pain:  Vitals:   09/09/22 1518  TempSrc: Oral  PainSc:                  Catheryn Bacon Tiphani Mells

## 2022-09-09 NOTE — Progress Notes (Signed)
OT Cancellation Note  Patient Details Name: Gary [name] MRN: [id number] DOB: 07-11-1955   Cancelled Treatment:    Reason Eval/Treat Not Completed:  (Pt prepping for GI procedure later today. Reports he has been accepted in inpatient rehab at [facility]) and wants to transfer there when discharged, not back to [facility]. CM and SW made aware.)  Evern Bio 09/09/2022, 9:24 AM Berna Spare, OTR/L Acute Rehabilitation Services Office: [phone]

## 2022-09-09 NOTE — Op Note (Addendum)
[facility] Patient Name: Gary [name] Procedure Date : 09/09/2022 MRN: [id number] Attending MD: Napoleon Form , MD, [id number] Date of Birth: October 03, 1955 CSN: [id number] Age: 67 Admit Type: Inpatient Procedure:                Upper GI endoscopy Indications:              Recent gastrointestinal bleeding, Gastrointestinal                            bleeding of unknown origin Providers:                Napoleon Form, MD, Geralyn Corwin, RN, Beryle Beams, Technician, Dairl Ponder, CRNA Referring MD:              Medicines:                Monitored Anesthesia Care Complications:            No immediate complications. Estimated Blood Loss:     Estimated blood loss was minimal. Procedure:                Pre-Anesthesia Assessment:                           - Prior to the procedure, a History and Physical                            was performed, and patient medications and                            allergies were reviewed. The patient's tolerance of                            previous anesthesia was also reviewed. The risks                            and benefits of the procedure and the sedation                            options and risks were discussed with the patient.                            All questions were answered, and informed consent                            was obtained. Prior Anticoagulants: The patient                            last took Eliquis (apixaban) 2 days prior to the                            procedure. ASA Grade Assessment: III - A patient  with severe systemic disease. After reviewing the                            risks and benefits, the patient was deemed in                            satisfactory condition to undergo the procedure.                           After obtaining informed consent, the endoscope was                            passed under direct vision. Throughout the                             procedure, the patient's blood pressure, pulse, and                            oxygen saturations were monitored continuously. The                            [id number]-H190 ([id number]) Olympus endoscope was introduced                            through the mouth, and advanced to the second part                            of duodenum. The upper GI endoscopy was                            accomplished without difficulty. The patient                            tolerated the procedure well. Scope In: Scope Out: Findings:      LA Grade B (one or more mucosal breaks greater than 5 mm, not extending       between the tops of two mucosal folds) esophagitis with no bleeding was       found 34 to 36 cm from the incisors.      A small hiatal hernia was present.      The entire examined stomach was normal.      The cardia and gastric fundus were normal on retroflexion.      The examined duodenum was normal. Impression:               - LA Grade B reflux esophagitis with no bleeding.                           - Small hiatal hernia.                           - Normal stomach.                           - Normal examined duodenum.                           -  No specimens collected. Recommendation:           - Resume diet, patient refusing to stay on clears                            so he doesnt have to repeat bowel prep                           - Continue present medications.                           - No obvious source to explain GI bleed on EGD.                           - Unable to perform flexible sigmoidoscopy , on                            rectal exam noted brown stool and he was laying in                            a pool of semi solid/liquid stool                           - Plan for flexible sigmoidoscopy on Tuesday, will                            plan to repeat bowel prep tomorrow and tap water                            enema X 2 on Tuesday 5/28 AM Procedure  Code(s):        --- Professional ---                           78295, Esophagogastroduodenoscopy, flexible,                            transoral; diagnostic, including collection of                            specimen(s) by brushing or washing, when performed                            (separate procedure) Diagnosis Code(s):        --- Professional ---                           K21.00, Gastro-esophageal reflux disease with                            esophagitis, without bleeding                           K44.9, Diaphragmatic hernia without obstruction or  gangrene                           K92.2, Gastrointestinal hemorrhage, unspecified CPT copyright 2022 American Medical Association. All rights reserved. The codes documented in this report are preliminary and upon coder review may  be revised to meet current compliance requirements. Napoleon Form, MD 09/09/2022 12:22:44 PM This report has been signed electronically. Number of Addenda: 0

## 2022-09-09 NOTE — Progress Notes (Addendum)
PROGRESS NOTE    Gary [name]  ZOX:[id number] DOB: 02/06/1956 DOA: 09/07/2022 PCP: [facility] Gary [name]   Brief Narrative:  Gary [name]  is a 67 y.o. 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 on 08/21/2022, presented to ER with blood in stool, reportedly 4-5 episodes and had 1 episode in the ED as well.  No reports of abdominal pain, fever, chills or hematemesis.  He is largely bedbound but can walk a few steps with a walker.  He was admitted to hospitalist service and GI, IR as well as PCCM were consulted.  Assessment & Plan:   Principal Problem:   GI bleed Active Problems:   Anticoagulated   Acute on chronic diastolic (congestive) heart failure ([name])   Chronic kidney disease, stage 3a ([name])   Chronic obstructive pulmonary disease (COPD) ([name])   Morbid obesity with BMI of 50.0-59.9, adult ([name])   GERD without esophagitis   HLD (hyperlipidemia)   Hyperlipidemia   Acute respiratory failure with hypoxia and hypercapnia ([name])   Anemia, posthemorrhagic, acute  Bright red blood per rectum/anemia of chronic disease: Patient's baseline hemoglobin appears to be around 10, currently 8.9 from last evening (morning labs are not drawn for some reason).  Not much drop in hemoglobin.  CTA negative for active bleed.  No intervention by IR needed at this point in time.  CT abdomen however shows acute on chronic diverticulitis, some concern of malignancy.  Seen by GI, plans for EGD and sigmoidoscopy  today. Continue Rocephin and Flagyl.  ?  Abdominal aortic aneurysm vs microdissection: Seen by with neurosurgery/Dr. Edilia [name], outpatient follow-up will be arranged by them.  Morbid obesity with history of OSA as well as chronic hypoxic and hypercapnic respiratory failure, chronically on vent: Apparently, he has not been connected to the vent or required any vent since he has been discharged to [facility] few weeks ago.  PCCM has verified that from [facility].  CKD stage IIIa: At baseline.  Chronic diastolic CHF: Recent echo showed ejection fraction of 65%.  Appears euvolemic.  Monitor for now.  Patient adamant and demands resuming Lasix.  Will do that.  History of hypotension: Continue midodrine.  History of DVT: Eliquis on hold due to bright red blood per rectum.  Will resume once cleared by GI.  History of depression: Continue Lexapro .    DVT prophylaxis: SCDs Start: 09/07/22 1720   Code Status: Full Code  Family Communication:  None present at bedside.  Plan of care discussed with patient in length and he/she verbalized understanding and agreed with it.  Status is: Inpatient Remains inpatient appropriate because: Scheduled for EGD and sigmoidoscopy today   Estimated body mass index is 35.41 kg/m as calculated from the following:   Height as of 08/12/22: 6\' 3"  (1.905 m).   Weight as of this encounter: 128.5 kg.    Nutritional Assessment: Body mass index is 35.41 kg/m.[name] Seen by dietician.  I agree with the assessment and plan as outlined below: Nutrition Status:        . Skin  Assessment: I have examined the patient's skin and I agree with the wound assessment as performed by the wound care RN as outlined below:    Consultants:  GI and PCCM  Procedures:  As above  Antimicrobials:  Anti-infectives (From admission, onward)    Start     Dose/Rate Route Frequency Ordered Stop   09/08/22 0815  [MAR Hold]  cefTRIAXone (ROCEPHIN) 2 g in sodium chloride 0.9 % 100 mL IVPB         (MAR Hold since Sun 09/09/2022 at 1115.Hold Reason: Transfer to a Procedural area)   2 g 200 mL/hr over 30 Minutes Intravenous Every 24 hours 09/08/22 0725     09/08/22 0815  [MAR Hold]  metroNIDAZOLE (FLAGYL) IVPB 500 mg        (MAR Hold since Sun 09/09/2022 at 1115.Hold Reason: Transfer to a Procedural area)   500 mg 100 mL/hr over 60 Minutes Intravenous Every 12 hours 09/08/22 0725           Subjective: Seen and examined, no new complaint.  No further episodes of rectal bleeding.  Objective: Vitals:   09/09/22 0746 09/09/22 0827 09/09/22 0905 09/09/22 1058  BP: (!) 112/56  111/76   Pulse: 85 76 81 77  Resp: 18 (!) 25 20 (!) 25  Temp: 98 F (36.7 C)     TempSrc: Oral     SpO2: 90% 92% 92% 92%  Weight:        Intake/Output Summary (Last 24 hours) at 09/09/2022 1121 Last data filed at 09/09/2022 1000 Gross per 24 hour  Intake 1586.58 ml  Output 1851 ml  Net -264.42 ml    Filed Weights   09/07/22 1948  Weight: 128.5 kg    Examination:  General exam: Appears calm and comfortable, morbidly obese Respiratory system: Clear to auscultation. Respiratory effort normal. Cardiovascular system: S1 & S2 heard, RRR. No JVD, murmurs, rubs, gallops or clicks. No pedal edema. Gastrointestinal system: Abdomen is nondistended, soft and nontender. No organomegaly or masses felt. Normal bowel sounds heard. Central nervous system: Alert and oriented. No focal neurological deficits. Extremities: Symmetric 5 x 5 power. Skin: No rashes, lesions or ulcers.  Psychiatry: Judgement and insight appear normal. Mood & affect appropriate.   Data Reviewed: I have personally reviewed following labs and imaging studies  CBC: Recent Labs  Lab 09/07/22 1548 09/07/22 1954 09/08/22 0336 09/08/22 0822 09/08/22 1733  WBC 8.7 8.2 6.3 5.4 7.3  NEUTROABS 6.2  --   --   --  4.8  HGB 8.8* 9.3* 9.1* 9.3* 8.9*  HCT 34.3* 34.5* 34.4* 35.2* 31.5*  MCV 80.5 79.3* 79.4* 78.9* 76.6*  PLT 182 168 165 164  168    Basic Metabolic Panel: Recent Labs  Lab 09/07/22 1548 09/08/22 0336  NA 139 137  K 4.4 4.3  CL 102 101  CO2 30 28  GLUCOSE 120* 111*  BUN 26* 23  CREATININE 1.24 1.27*  CALCIUM 7.9* 8.4*  MG  --  2.1    GFR: Estimated Creatinine Clearance: 83.7 mL/min (A) (by C-G formula based on SCr of 1.27 mg/dL (H)). Liver Function Tests: Recent Labs  Lab 09/07/22 1548  AST 12*  ALT 10  ALKPHOS 93  BILITOT 0.2*  PROT 6.8  ALBUMIN 2.5*    No results for input(s): "LIPASE", "AMYLASE" in the last 168 hours. No results for input(s): "AMMONIA" in the last 168 hours. Coagulation Profile: Recent Labs  Lab 09/07/22 1548 09/08/22 0336  INR 1.1 1.1  Cardiac Enzymes: No results for input(s): "CKTOTAL", "CKMB", "CKMBINDEX", "TROPONINI" in the last 168 hours. BNP (last 3 results) No results for input(s): "PROBNP" in the last 8760 hours. HbA1C: No results for input(s): "HGBA1C" in the last 72 hours. CBG: No results for input(s): "GLUCAP" in the last 168 hours. Lipid Profile: No results for input(s): "CHOL", "HDL", "LDLCALC", "TRIG", "CHOLHDL", "LDLDIRECT" in the last 72 hours. Thyroid Function Tests: No results for input(s): "TSH", "T4TOTAL", "FREET4", "T3FREE", "THYROIDAB" in the last 72 hours. Anemia Panel: No results for input(s): "VITAMINB12", "FOLATE", "FERRITIN", "TIBC", "IRON", "RETICCTPCT" in the last 72 hours. Sepsis Labs: No results for input(s): "PROCALCITON", "LATICACIDVEN" in the last 168 hours.  Recent Results (from the past 240 hour(s))  MRSA Next Gen by PCR, Nasal     Status: None   Collection Time: 09/07/22  9:14 PM   Specimen: Nasal Mucosa; Nasal Swab  Result Value Ref Range Status   MRSA by PCR Next Gen NOT DETECTED NOT DETECTED Final    Comment: (NOTE) The GeneXpert MRSA Assay (FDA approved for NASAL specimens only), is one component of a comprehensive MRSA colonization surveillance program. It is not intended to diagnose MRSA infection nor to  guide or monitor treatment for MRSA infections. Test performance is not FDA approved in patients less than 20 years old. Performed at [facility], [address], [place], [place] [address]      Radiology Studies: CT ANGIO GI BLEED  Result Date: 09/07/2022 CLINICAL DATA:  Hematochezia * Tracking Code: [name] * EXAM: CTA ABDOMEN AND PELVIS WITHOUT AND WITH CONTRAST TECHNIQUE: Multidetector CT imaging of the abdomen and pelvis was performed using the standard protocol during bolus administration of intravenous contrast. Multiplanar reconstructed images and MIPs were obtained and reviewed to evaluate the vascular anatomy. RADIATION DOSE REDUCTION: This exam was performed according to the departmental dose-optimization program which includes automated exposure control, adjustment of the mA and/or kV according to patient size and/or use of iterative reconstruction technique. CONTRAST:  OMNIPAQUE IOHEXOL 350 MG/ML SOLN COMPARISON:  11/16/2021 FINDINGS: VASCULAR Unchanged contour and caliber of the abdominal aorta. Focal, aneurysmal dissection or chronic penetrating ulceration of the infrarenal abdominal aorta measuring up to 3.1 x 2.3 cm in caliber (series 10, image 131). Standard branching pattern of the abdominal aorta, with solitary bilateral renal arteries. Mild mixed calcific atherosclerosis. Review of the MIP images confirms the above findings. NON-VASCULAR Lower chest: Scarring or atelectasis of the bilateral lung bases. Coronary artery calcifications. Hepatobiliary: No solid liver abnormality is seen. Gallstones contracted in the gallbladder. No gallbladder wall thickening, or biliary dilatation. Pancreas: Unremarkable. No pancreatic ductal dilatation or surrounding inflammatory changes. Spleen: Normal in size without significant abnormality. Adrenals/Urinary Tract: Unchanged, definitively benign small right adrenal adenomata, for which no further follow-up or characterization is required.  Punctuate nonobstructive calculus of the inferior pole of the left kidney (series 8, image 69). No right-sided calculi, ureteral calculi, or hydronephrosis. Bladder is unremarkable. Stomach/Bowel: Stomach is within normal limits. Status post right hemicolectomy and reanastomosis. Descending and sigmoid diverticula. Unchanged, markedly thickened and tethered appearing loop of distal descending colon and proximal sigmoid colon in the left lower quadrant (series 10, image 162). Unchanged enlarged lymph node adjacent to the left aspect of the proximal sigmoid colon measuring 1.1 x 0.8 cm (series 17, image 23). Lymphatic: No other enlarged abdominal or pelvic lymph nodes. Reproductive: No mass or other significant abnormality. Other: Fat containing umbilical hernia (series 10, image 195) no ascites. Musculoskeletal: No acute or significant  osseous findings. IMPRESSION: 1. Descending and sigmoid diverticula. Unchanged, markedly thickened and tethered appearing loop of distal descending colon and proximal sigmoid colon in the left lower quadrant. Unchanged enlarged lymph node adjacent to the left aspect of the proximal sigmoid colon. Findings are most suggestive of acute on chronic diverticulitis, however very focal and thickened appearance remains worrisome for underlying malignancy. Consider endoscopy if not already performed based on findings of prior examination. 2. Status post right hemicolectomy and reanastomosis. 3. Unchanged contour and caliber of the abdominal aorta. Focal, aneurysmal dissection or chronic penetrating ulceration of the infrarenal abdominal aorta measuring up to 3.1 x 2.3 cm in caliber. Recommend referral to a vascular specialist if not already obtained. This recommendation follows ACR consensus guidelines: White Paper of the ACR Incidental Findings Committee II on Vascular Findings. J Am Coll Radiol 2013; 16:109-604. 4. Coronary artery disease. 5. Cholelithiasis. 6. Nonobstructive left  nephrolithiasis. Aortic Atherosclerosis (ICD10-I70.0). Electronically Signed   By: Jearld Lesch M.D.   On: 09/07/2022 19:11    Scheduled Meds:  [VWU Hold] Chlorhexidine Gluconate Cloth  6 each Topical Daily   [MAR Hold] docusate sodium  100 mg Oral Daily   [MAR Hold] escitalopram  5 mg Oral Daily   [MAR Hold] furosemide  40 mg Intravenous BID   [MAR Hold] midodrine  10 mg Oral TID WC   [MAR Hold] mouth rinse  15 mL Mouth Rinse Q2H   [MAR Hold] pantoprazole (PROTONIX) IV  40 mg Intravenous Q12H   Continuous Infusions:  sodium chloride     [MAR Hold] cefTRIAXone (ROCEPHIN)  IV 2 g (09/09/22 0907)   [MAR Hold] metronidazole 500 mg (09/09/22 1041)     LOS: 2 days   Hughie Closs, MD [facility]  09/09/2022, 11:21 AM   *Please note that this is a verbal dictation therefore any spelling or grammatical errors are due to the "Dragon Medical One" system interpretation.  Please page via Amion and do not message via secure chat for urgent patient care matters. Secure chat can be used for non urgent patient care matters.  How to contact the [facility] Attending or Consulting provider 7A - 7P or covering provider during after hours 7P -7A, for this patient?  Check the care team in [name] and look for a) attending/consulting [facility] provider listed and b) the [facility] team listed. Page or secure chat 7A-7P. Log into [email or web address] and use [facility] universal password to access. If you do not have the password, please contact the hospital operator. Locate the [facility] provider you are looking for under [facility] and page to a number that you can be directly reached. If you still have difficulty reaching the provider, please page the [facility] (Director on Call) for the Hospitalists listed on amion for assistance.

## 2022-09-09 NOTE — Progress Notes (Signed)
PT has refused multiple interventions during the night. Was allowed to sleep an hour extra and woken up and given second bowel prep., education provided that PT needed to drink bowel prep to have colon clean for procedure.  PT went back to sleep. Refused morning labs.  Refused daily weight. Refused Hygiene (CHG wipes) and bed change until he realized he was lying in stool.  Stated we were "Putting more fluid on him, I can feel it in my feet".   0600 PT asked when his procedure would be, education provided that they would not be able to do the procedure with all the stool still in his colon. PT drank the prep.all at once.  NA and RN did complete bed change at that time. PT telling staff how to turn him. PT told that staff needed to do what was safe for their backs, he is able to do most of the turn himself. PT is able to 75% of turn independently but will tell staff to do for him what he can do for himself. PT turned to left with minimal assistance.   PT calling out from room through the night, refuses to have door or curtain closed. Educated every time he called out that he needed to use the call bell. PT stated, "They didn't answer." RN stated, "Yes, they do. I was standing at the nurse's station and the charge nurse said, 'How may I help you?' And you didn't answer."   Earlier in the shift, when PT told not to call out, PT stated, "You are in the hall." RN was in room across the hall, made PT aware it is not appropriate to call out, that he is not aware of other things requiring nursing attention.   PT is more respectful of male providers as noted with RN Joe and then [employer].   Verbalized to NA Shontel that she was ugly.   Dr. Imogene Burn and Gastroenterologist made aware. RN from GI procedures called for update, made her aware PT was not ready related to PT refusals and PT delay in taking prep.  RN requested that Gastroenterologist come to bedside to reinforce plan of care for prep and procedure.   Dr.  Imogene Burn also notified at [phone] that PT has very low blood pressures on the left and normal blood pressures on the right. Order for bolus of fluids, PT continues to feel we are "filling him up again." Made Dr. Imogene Burn aware, order discontinued. Also made Gastroenterologist aware did not start fluids at 71ml/hr for same reason.

## 2022-09-09 NOTE — Progress Notes (Signed)
RT note. RT called to bedside by RN in regards to patient transport and placing patient on venti mask for transport. Patient placed on 40%-8L venti mask sat 92% with stable VS, no labored breathing noted.    09/09/22 1058  Therapy Vitals  Pulse Rate 77  Resp (!) 25  MEWS Score/Color  MEWS Score 1  MEWS Score Color Green  Respiratory Assessment  Assessment Type Assess only  Respiratory Pattern Regular;Unlabored  Chest Assessment Chest expansion symmetrical  Cough Non-productive;Strong  Sputum Specimen Source Spontaneous cough  Bilateral Breath Sounds Diminished  Oxygen Therapy/Pulse Ox  O2 Device Tracheostomy Collar  O2 Therapy Oxygen humidified  O2 Flow Rate (L/min) 6 L/min  FiO2 (%) 28 %  SpO2 92 %  Tracheostomy Shiley XLT Proximal 7 mm Uncuffed  Placement Date/Time: 08/12/22 1815   Inserted prior to hospital arrival?: Placed at another facility  Brand: Shiley XLT Proximal  Size (mm): 7 mm  Style: Uncuffed  Status Secured with trach ties;Passy Muir Speaking valve  Site Assessment Clean;Dry  Ties Assessment Clean, Dry  Tracheostomy Equipment at bedside Yes and checklist posted at head of bed  Respiratory  Airway LDA Tracheostomy

## 2022-09-09 NOTE — Anesthesia Preprocedure Evaluation (Addendum)
Anesthesia Evaluation  Patient identified by MRN, date of birth, ID band Patient awake    Reviewed: Allergy & Precautions, NPO status , Patient's Chart, lab work & pertinent test results  Airway Mallampati: Trach  TM Distance: >3 FB Neck ROM: Full    Dental no notable dental hx.    Pulmonary sleep apnea and Oxygen sleep apnea , COPD,  COPD inhaler and oxygen dependent, former smoker tracheostomy  trach Pulmonary exam normal        Cardiovascular hypertension, +CHF  Normal cardiovascular exam     Neuro/Psych  PSYCHIATRIC DISORDERS  Depression    negative neurological ROS     GI/Hepatic Neg liver ROS, PUD,GERD  Controlled and Medicated,,  Endo/Other  negative endocrine ROS    Renal/GU Renal disease     Musculoskeletal  (+) Arthritis ,    Abdominal  (+) + obese  Peds  Hematology  (+) Blood dyscrasia (Eliquis), anemia   Anesthesia Other Findings Anemia GI Bleed  Reproductive/Obstetrics                             Anesthesia Physical Anesthesia Plan  ASA: 4  Anesthesia Plan: MAC   Post-op Pain Management:    Induction: Inhalational and Intravenous  PONV Risk Score and Plan: 1 and Treatment may vary due to age or medical condition  Airway Management Planned: Tracheostomy  Additional Equipment:   Intra-op Plan:   Post-operative Plan:   Informed Consent: I have reviewed the patients History and Physical, chart, labs and discussed the procedure including the risks, benefits and alternatives for the proposed anesthesia with the patient or authorized representative who has indicated his/her understanding and acceptance.     Dental advisory given  Plan Discussed with: CRNA  Anesthesia Plan Comments:         Anesthesia Quick Evaluation

## 2022-09-09 NOTE — Transfer of Care (Signed)
Immediate Anesthesia Transfer of Care Note  Patient: Gary [name]  Procedure(s) Performed: FLEXIBLE SIGMOIDOSCOPY ESOPHAGOGASTRODUODENOSCOPY (EGD) WITH PROPOFOL  Patient Location: PACU  Anesthesia Type:MAC  Level of Consciousness: awake, alert , and oriented  Airway & Oxygen Therapy: Patient Spontanous Breathing and Patient connected to face mask oxygen  Post-op Assessment: Report given to RN and Post -op Vital signs reviewed and stable  Post vital signs: Reviewed and stable  Last Vitals:  Vitals Value Taken Time  BP    Temp    Pulse    Resp    SpO2      Last Pain:  Vitals:   09/09/22 1120  TempSrc:   PainSc: 0-No pain         Complications: No notable events documented.

## 2022-09-09 NOTE — Interval H&P Note (Signed)
History and Physical Interval Note:  09/09/2022 11:21 AM  Gary [name]  has presented today for surgery, with the diagnosis of Anemia, GI Bleed.  The various methods of treatment have been discussed with the patient and family. After consideration of risks, benefits and other options for treatment, the patient has consented to  Procedure(s): FLEXIBLE SIGMOIDOSCOPY (N/A) ESOPHAGOGASTRODUODENOSCOPY (EGD) WITH PROPOFOL (N/A) as a surgical intervention.  The patient's history has been reviewed, patient examined, no change in status, stable for surgery.  I have reviewed the patient's chart and labs.  Questions were answered to the patient's satisfaction.     [name]

## 2022-09-10 DIAGNOSIS — K922 Gastrointestinal hemorrhage, unspecified: Secondary | ICD-10-CM | POA: Diagnosis not present

## 2022-09-10 DIAGNOSIS — E876 Hypokalemia: Secondary | ICD-10-CM

## 2022-09-10 DIAGNOSIS — N183 Chronic kidney disease, stage 3 unspecified: Secondary | ICD-10-CM

## 2022-09-10 DIAGNOSIS — K5731 Diverticulosis of large intestine without perforation or abscess with bleeding: Secondary | ICD-10-CM

## 2022-09-10 DIAGNOSIS — I5032 Chronic diastolic (congestive) heart failure: Secondary | ICD-10-CM | POA: Diagnosis not present

## 2022-09-10 DIAGNOSIS — R933 Abnormal findings on diagnostic imaging of other parts of digestive tract: Secondary | ICD-10-CM

## 2022-09-10 LAB — CBC WITH DIFFERENTIAL/PLATELET
Abs Immature Granulocytes: 0.04 10*3/uL (ref 0.00–0.07)
Basophils Absolute: 0 10*3/uL (ref 0.0–0.1)
Basophils Relative: 0 %
Eosinophils Absolute: 0.1 10*3/uL (ref 0.0–0.5)
Eosinophils Relative: 2 %
HCT: 31.6 % — ABNORMAL LOW (ref 39.0–52.0)
Hemoglobin: 8.5 g/dL — ABNORMAL LOW (ref 13.0–17.0)
Immature Granulocytes: 1 %
Lymphocytes Relative: 22 %
Lymphs Abs: 1.4 10*3/uL (ref 0.7–4.0)
MCH: 21.7 pg — ABNORMAL LOW (ref 26.0–34.0)
MCHC: 26.9 g/dL — ABNORMAL LOW (ref 30.0–36.0)
MCV: 80.8 fL (ref 80.0–100.0)
Monocytes Absolute: 0.8 10*3/uL (ref 0.1–1.0)
Monocytes Relative: 13 %
Neutro Abs: 4.1 10*3/uL (ref 1.7–7.7)
Neutrophils Relative %: 62 %
Platelets: 205 10*3/uL (ref 150–400)
RBC: 3.91 MIL/uL — ABNORMAL LOW (ref 4.22–5.81)
RDW: 18.6 % — ABNORMAL HIGH (ref 11.5–15.5)
WBC: 6.6 10*3/uL (ref 4.0–10.5)
nRBC: 1.1 % — ABNORMAL HIGH (ref 0.0–0.2)

## 2022-09-10 LAB — BASIC METABOLIC PANEL
Anion gap: 9 (ref 5–15)
BUN: 16 mg/dL (ref 8–23)
CO2: 30 mmol/L (ref 22–32)
Calcium: 7.7 mg/dL — ABNORMAL LOW (ref 8.9–10.3)
Chloride: 102 mmol/L (ref 98–111)
Creatinine, Ser: 1.47 mg/dL — ABNORMAL HIGH (ref 0.61–1.24)
GFR, Estimated: 53 mL/min — ABNORMAL LOW (ref >60)
Glucose, Bld: 103 mg/dL — ABNORMAL HIGH (ref 70–99)
Potassium: 3.3 mmol/L — ABNORMAL LOW (ref 3.5–5.1)
Sodium: 141 mmol/L (ref 135–145)

## 2022-09-10 MED ORDER — POTASSIUM CHLORIDE CRYS ER 20 MEQ PO TBCR
40.0000 meq | EXTENDED_RELEASE_TABLET | Freq: Once | ORAL | Status: AC
  Administered 2022-09-10: 40 meq via ORAL
  Filled 2022-09-10: qty 2

## 2022-09-10 MED ORDER — PEG-KCL-NACL-NASULF-NA ASC-C 100 G PO SOLR
0.5000 | Freq: Once | ORAL | Status: AC
  Administered 2022-09-10: 100 g via ORAL
  Filled 2022-09-10: qty 1

## 2022-09-10 MED ORDER — PEG-KCL-NACL-NASULF-NA ASC-C 100 G PO SOLR
0.5000 | Freq: Once | ORAL | Status: DC
  Filled 2022-09-10: qty 1

## 2022-09-10 MED ORDER — PEG-KCL-NACL-NASULF-NA ASC-C 100 G PO SOLR: 1.0000 | Freq: Once | ORAL | Status: DC

## 2022-09-10 NOTE — Progress Notes (Signed)
RT note. RT at bedside during morning rounds. Patient found on 30% ATC sat high 70s-80 with good plyth reading, no labored breathing noted. Patient eating breakfast and not in distress at this time. Advised patient to take PM off at this time, patient refused due to eating. Patient turned to 12L-100% sats increase to 91%. RT will re-evaluate and titrate oxygen back down as needed. Advised patient to keep pulse ox on at this time due to incident. RT will continue to monitor.    09/10/22 0735  Therapy Vitals  Pulse Rate 87  Resp (!) 26  MEWS Score/Color  MEWS Score 3  MEWS Score Color Yellow  Respiratory Assessment  Assessment Type Assess only  Respiratory Pattern Regular;Unlabored  Chest Assessment Chest expansion symmetrical  Bilateral Breath Sounds Diminished  Oxygen Therapy/Pulse Ox  O2 Device Tracheostomy Collar  O2 Therapy Oxygen humidified  O2 Flow Rate (L/min) 12 L/min  FiO2 (%) (S)  100 %  SpO2 (!) 77 %  Tracheostomy Shiley XLT Proximal 7 mm Uncuffed  Placement Date/Time: 08/12/22 1815   Inserted prior to hospital arrival?: Placed at another facility  Brand: Shiley XLT Proximal  Size (mm): 7 mm  Style: Uncuffed  Status Secured with trach ties;Passy Muir Speaking valve  Site Assessment Clean;Dry  Ties Assessment Clean, Dry  Tracheostomy Equipment at bedside Yes and checklist posted at head of bed  Respiratory  Airway LDA Tracheostomy

## 2022-09-10 NOTE — Progress Notes (Signed)
OT Cancellation Note  Patient Details Name: Gary [name] MRN: [id number] DOB: 1955-12-12   Cancelled Treatment:    Reason Eval/Treat Not Completed: Patient declined, no reason specified (states  he is having diarrhea and cannot participate. Per RN pt does not begin colonoscopy prep until 4pm, encouraged pt to attempt sitting EOB or get to [facility] however pt declines. Will follow up as schedule permits)  Carver Fila, OTD, OTR/L [facility]Chat [facility] [phone]   Dalphine Handing 09/10/2022, 1:52 PM

## 2022-09-10 NOTE — Plan of Care (Signed)
  Problem: Elimination: Goal: Will not experience complications related to urinary retention Outcome: Completed/Met   Problem: Pain Managment: Goal: General experience of comfort will improve Outcome: Completed/Met   Problem: Safety: Goal: Ability to remain free from injury will improve Outcome: Completed/Met   

## 2022-09-10 NOTE — Progress Notes (Addendum)
PROGRESS NOTE    Gary [name]  ZOX:[id number] DOB: Nov 21, 1955 DOA: 09/07/2022 PCP: [facility]   Brief Narrative:  Gary [name]  is a 67 y.o. 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  No reports of abdominal pain, fever, chills or hematemesis.  He is largely bedbound but can walk a few steps with a walker.  He was admitted to hospitalist service and GI, IR as well as PCCM were consulted.  Assessment & Plan:   Principal Problem:   Lower GI bleed Active Problems:   Anticoagulated   Acute on chronic diastolic (congestive) heart failure ([name])   Chronic kidney disease, stage 3a ([name])   Chronic obstructive pulmonary disease (COPD) ([name])   Morbid obesity with BMI of 50.0-59.9, adult ([name])   GERD without esophagitis   HLD (hyperlipidemia)   Hyperlipidemia   Acute respiratory failure with hypoxia and hypercapnia ([name])   Anemia, posthemorrhagic, acute   Diverticulosis of colon with hemorrhage   Abnormal CT scan, gastrointestinal tract  Bright red blood per rectum/anemia of chronic disease: Patient's baseline hemoglobin appears to be around 10, currently 8.9 from last evening (morning labs are not drawn for some reason).  Not much drop in hemoglobin.  CTA negative for active bleed.  No intervention by IR needed at this point in time.  CT abdomen however shows acute on  chronic diverticulitis, some concern of malignancy.  Continue Rocephin and Flagyl.  GI on board.  Underwent EGD 09/09/2022 which showed reflux esophagitis with no bleeding site.  Plan for sigmoidoscopy tomorrow.  Remains on PPI.  Hypokalemia: Replace.  ?  Abdominal aortic aneurysm vs microdissection: Seen by with neurosurgery/Dr. Edilia Bo, outpatient follow-up will be arranged by them.  Morbid obesity with history of OSA as well as chronic hypoxic and hypercapnic respiratory failure, chronically on vent: Apparently, he has not been connected to the vent or required any vent since he has been discharged to [facility] few weeks ago.  PCCM has verified that from [facility].  CKD stage IIIa: At baseline.  Chronic diastolic CHF: Recent echo showed ejection fraction of 65%.  Appears euvolemic.  Continue Lasix per patient's request.  History of hypotension: Continue midodrine.  History of DVT: Eliquis on hold due to bright red blood per rectum.  Will resume once cleared by GI.  History of depression: Continue Lexapro .    DVT prophylaxis: SCDs Start: 09/07/22 1720   Code Status: Full Code  Family Communication:  None present at bedside.  Plan of care discussed with patient in length and he/she verbalized understanding and agreed with it.  Status is: Inpatient Remains inpatient appropriate because: Scheduled for sigmoidoscopy tomorrow   Estimated body mass index is 48.44 kg/m as calculated from the following:   Height as of this encounter: 6\' 3"  (1.905 m).   Weight as of this encounter: 175.8 kg.    Nutritional Assessment: Body mass index is 48.44 kg/m.[name] Seen  by dietician.  I agree with the assessment and plan as outlined below: Nutrition Status:        . Skin Assessment: I have examined the patient's skin and I agree with the wound assessment as performed by the wound care RN as outlined below:    Consultants:  GI and PCCM  Procedures:  As above  Antimicrobials:  Anti-infectives  (From admission, onward)    Start     Dose/Rate Route Frequency Ordered Stop   09/08/22 0815  cefTRIAXone (ROCEPHIN) 2 g in sodium chloride 0.9 % 100 mL IVPB        2 g 200 mL/hr over 30 Minutes Intravenous Every 24 hours 09/08/22 0725     09/08/22 0815  metroNIDAZOLE (FLAGYL) IVPB 500 mg        500 mg 100 mL/hr over 60 Minutes Intravenous Every 12 hours 09/08/22 0725           Subjective: Patient seen and examined.  No complaints.  Objective: Vitals:   09/10/22 0735 09/10/22 0744 09/10/22 1117 09/10/22 1122  BP:  118/75  120/76  Pulse: 87 85 81 83  Resp: (!) 26 18 (!) 22 20  Temp:  98.2 F (36.8 C)  98.1 F (36.7 C)  TempSrc:  Oral  Oral  SpO2: (!) 77% 98% 95% 99%  Weight:      Height:        Intake/Output Summary (Last 24 hours) at 09/10/2022 1156 Last data filed at 09/10/2022 0800 Gross per 24 hour  Intake 560 ml  Output 400 ml  Net 160 ml    Filed Weights   09/07/22 1948 09/09/22 1120 09/10/22 0522  Weight: 128.5 kg 128.5 kg (!) 175.8 kg    Examination:  General exam: Appears calm and comfortable  Respiratory system: Clear to auscultation. Respiratory effort normal. Cardiovascular system: S1 & S2 heard, RRR. No JVD, murmurs, rubs, gallops or clicks. No pedal edema. Gastrointestinal system: Abdomen is nondistended, soft and nontender. No organomegaly or masses felt. Normal bowel sounds heard. Central nervous system: Alert and oriented. No focal neurological deficits. Extremities: Symmetric 5 x 5 power. Skin: No rashes, lesions or ulcers.   Data Reviewed: I have personally reviewed following labs and imaging studies  CBC: Recent Labs  Lab 09/07/22 1548 09/07/22 1954 09/08/22 0336 09/08/22 0822 09/08/22 1733 09/10/22 0301  WBC 8.7 8.2 6.3 5.4 7.3 6.6  NEUTROABS 6.2  --   --   --  4.8 4.1  HGB 8.8* 9.3* 9.1* 9.3* 8.9* 8.5*  HCT 34.3* 34.5* 34.4* 35.2* 31.5* 31.6*  MCV 80.5 79.3* 79.4* 78.9* 76.6* 80.8  PLT 182 168 165 164 168 205    Basic  Metabolic Panel: Recent Labs  Lab 09/07/22 1548 09/08/22 0336 09/10/22 0301  NA 139 137 141  K 4.4 4.3 3.3*  CL 102 101 102  CO2 30 28 30   GLUCOSE 120* 111* 103*  BUN 26* 23 16  CREATININE 1.24 1.27* 1.47*  CALCIUM 7.9* 8.4* 7.7*  MG  --  2.1  --     GFR: Estimated Creatinine Clearance: 85.7 mL/min (A) (by C-G formula based on SCr of 1.47 mg/dL (H)). Liver Function Tests: Recent Labs  Lab 09/07/22 1548  AST 12*  ALT 10  ALKPHOS 93  BILITOT 0.2*  PROT 6.8  ALBUMIN 2.5*    No results for input(s): "LIPASE", "AMYLASE" in the last 168 hours. No results for input(s): "AMMONIA" in the last 168 hours. Coagulation Profile: Recent Labs  Lab 09/07/22 1548  09/08/22 0336  INR 1.1 1.1    Cardiac Enzymes: No results for input(s): "CKTOTAL", "CKMB", "CKMBINDEX", "TROPONINI" in the last 168 hours. BNP (last 3 results) No results for input(s): "PROBNP" in the last 8760 hours. HbA1C: No results for input(s): "HGBA1C" in the last 72 hours. CBG: No results for input(s): "GLUCAP" in the last 168 hours. Lipid Profile: No results for input(s): "CHOL", "HDL", "LDLCALC", "TRIG", "CHOLHDL", "LDLDIRECT" in the last 72 hours. Thyroid Function Tests: No results for input(s): "TSH", "T4TOTAL", "FREET4", "T3FREE", "THYROIDAB" in the last 72 hours. Anemia Panel: No results for input(s): "VITAMINB12", "FOLATE", "FERRITIN", "TIBC", "IRON", "RETICCTPCT" in the last 72 hours. Sepsis Labs: No results for input(s): "PROCALCITON", "LATICACIDVEN" in the last 168 hours.  Recent Results (from the past 240 hour(s))  MRSA Next Gen by PCR, Nasal     Status: None   Collection Time: 09/07/22  9:14 PM   Specimen: Nasal Mucosa; Nasal Swab  Result Value Ref Range Status   MRSA by PCR Next Gen NOT DETECTED NOT DETECTED Final    Comment: (NOTE) The GeneXpert MRSA Assay (FDA approved for NASAL specimens only), is one component of a comprehensive MRSA colonization surveillance program. It is not  intended to diagnose MRSA infection nor to guide or monitor treatment for MRSA infections. Test performance is not FDA approved in patients less than 67 years old. Performed at [facility], [address], [place], [place] [address]      Radiology Studies: No results found.  Scheduled Meds:  Chlorhexidine Gluconate Cloth  6 each Topical Daily   docusate sodium  100 mg Oral Daily   escitalopram  5 mg Oral Daily   furosemide  40 mg Intravenous BID   midodrine  10 mg Oral TID WC   pantoprazole (PROTONIX) IV  40 mg Intravenous Q12H   peg 3350 powder  0.5 kit Oral Once   And   peg 3350 powder  0.5 kit Oral Once   Continuous Infusions:  cefTRIAXone (ROCEPHIN)  IV 2 g (09/10/22 0949)   metronidazole 500 mg (09/10/22 1109)     LOS: 3 days   Hughie Closs, MD [facility]  09/10/2022, 11:56 AM   *Please note that this is a verbal dictation therefore any spelling or grammatical errors are due to the "Dragon Medical One" system interpretation.  Please page via Amion and do not message via secure chat for urgent patient care matters. Secure chat can be used for non urgent patient care matters.  How to contact the [facility] Attending or Consulting provider 7A - 7P or covering provider during after hours 7P -7A, for this patient?  Check the care team in [facility] and look for a) attending/consulting TRH provider listed and b) the [facility] team listed. Page or secure chat 7A-7P. Log into [email or web address] and use [place]'s universal password to access. If you do not have the password, please contact the hospital operator. Locate the [facility] provider you are looking for under [facility] and page to a number that you can be directly reached. If you still have difficulty reaching the provider, please page the [facility] (Director on Call) for the Hospitalists listed on amion for assistance.

## 2022-09-10 NOTE — Progress Notes (Signed)
   09/10/22 0522  Assess: MEWS Score  Temp 98.3 F (36.8 C)  BP 99/62  Pulse Rate 90  Resp (!) 21  SpO2 92 %  O2 Device Tracheostomy Collar  Assess: MEWS Score  MEWS Temp 0  MEWS Systolic 1  MEWS Pulse 0  MEWS RR 1  MEWS LOC 0  MEWS Score 2  MEWS Score Color Yellow  Assess: if the MEWS score is Yellow or Red  Were vital signs taken at a resting state? Yes  Focused Assessment No change from prior assessment  Does the patient meet 2 or more of the SIRS criteria? No  MEWS guidelines implemented  No, previously yellow, continue vital signs every 4 hours  Assess: SIRS CRITERIA  SIRS Temperature  0  SIRS Pulse 0  SIRS Respirations  1  SIRS WBC 0  SIRS Score Sum  1

## 2022-09-10 NOTE — Progress Notes (Signed)
PT Cancellation Note  Patient Details Name: Gary [name] MRN: [id number] DOB: 07/11/1955   Cancelled Treatment:    Reason Eval/Treat Not Completed: Other (comment). Pt continues to refuse all PT and mobility. Says Gary [name] has diarrhea. Suggested assisting him to [facility]. Gary [name] declined. Says Gary [name] "has a lot going on". If pt continues to refuse will dc from PT.   Angelina Ok [facility] 09/10/2022, 10:04 AM Skip Mayer PT Acute Rehabilitation Services Office [phone]

## 2022-09-10 NOTE — H&P (View-Only) (Signed)
  Progress Note   Subjective  Chief Complaint: GI bleed  EGD 09/09/2022 with LA grade B reflux esophagitis, small hiatal hernia and normal stomach  Hemoglobin drifted down slowly over the past 24 hours 9.3--> 8.9--> 8.5.  Patient with no further overt bleeding per nursing staff.  Aware of plans for flex sig tomorrow.    Objective   Vital signs in last 24 hours: Temp:  [97.9 F (36.6 C)-98.3 F (36.8 C)] 98.2 F (36.8 C) (05/27 0744) Pulse Rate:  [75-90] 85 (05/27 0744) Resp:  [17-30] 18 (05/27 0744) BP: (97-118)/(60-79) 118/75 (05/27 0744) SpO2:  [77 %-98 %] 98 % (05/27 0744) FiO2 (%):  [28 %-100 %] 100 % (05/27 0735) Weight:  [128.5 kg-175.8 kg] 175.8 kg (05/27 0522) Last BM Date : 09/09/22 (pt was bowel prepped yesterday) General:  AA male in NAD Heart:  Regular rate and rhythm; no murmurs Lungs: Respirations even and unlabored, lungs CTA bilaterally +trach Abdomen:  Soft, nontender and nondistended. Normal bowel sounds. Psych:  Cooperative. Normal mood and affect.  Intake/Output from previous day: 05/26 0701 - 05/27 0700 In: 320 [P.O.:220; IV Piggyback:100] Out: 951 [Urine:950; Stool:1] Intake/Output this shift: Total I/O In: 240 [P.O.:240] Out: -   Lab Results: Recent Labs    09/08/22 0822 09/08/22 1733 09/10/22 0301  WBC 5.4 7.3 6.6  HGB 9.3* 8.9* 8.5*  HCT 35.2* 31.5* 31.6*  PLT 164 168 205   BMET Recent Labs    09/07/22 1548 09/08/22 0336 09/10/22 0301  NA 139 137 141  K 4.4 4.3 3.3*  CL 102 101 102  CO2 30 28 30  GLUCOSE 120* 111* 103*  BUN 26* 23 16  CREATININE 1.24 1.27* 1.47*  CALCIUM 7.9* 8.4* 7.7*   LFT Recent Labs    09/07/22 1548  PROT 6.8  ALBUMIN 2.5*  AST 12*  ALT 10  ALKPHOS 93  BILITOT 0.2*   PT/INR Recent Labs    09/07/22 1548 09/08/22 0336  LABPROT 14.3 14.3  INR 1.1 1.1    Assessment / Plan:   Assessment: 1.  GI bleed: Started 09/06/2022 per [facility] where patient was staying, mixed reports of bright red  blood and black tarry stool, hemoglobin fairly stable since admission with minimal signs of GI bleeding, CT angio with thickened appearing loop of the distal descending colon and proximal sigmoid colon concerning for acute on chronic diverticulitis, cannot rule out underlying malignancy, status post right hemicolectomy and reanastomosis, EGD 09/09/2022 with grade B esophagitis and otherwise normal, flex sig unable to be done at that time as patient was not appropriately prepped; consider lower GI source 2.  Morbid obesity with OSA, chronic toxic and hypercapnic respiratory failure with trach collar 3.  CKD stage III 4.  Chronic diastolic CHF and EF of 65% 5.  History of DVT: On Eliquis, last dose documented 09/06/2022 PM 6. Hypokalemia: has been addressed by primary team  Plan: 1.  Plans for flex sigmoidoscopy tomorrow with Dr. Mansouraty.  Did review risks, benefits, limitations and alternatives and the patient agrees to proceed. 2.  Patient will be changed back to a clear liquid diet for now and n.p.o. at midnight. 3.  Continue to monitor hemoglobin with transfusion as needed less than 7 4.  Will go ahead and add back in movi prep to be started this evening at 3:00 in split dose fashion.  Patient does not want to drink this.  We agreed that as long as he gets half of it in, that would   be helpful. 5.  Patient will also have 2 tapwater enemas before time of procedure tomorrow.  Hopefully he will be better cleaned out.  Thank you for your kind consultation.  Dr. Mansouraty will resume patient's care tomorrow.   LOS: 3 days   Gary Gary [name]  09/10/2022, 10:02 AM    

## 2022-09-10 NOTE — Progress Notes (Addendum)
Progress Note   Subjective  Chief Complaint: GI bleed  EGD 09/09/2022 with LA grade B reflux esophagitis, small hiatal hernia and normal stomach  Hemoglobin drifted down slowly over the past 24 hours 9.3--> 8.9--> 8.5.  Patient with no further overt bleeding per nursing staff.  Aware of plans for flex sig tomorrow.    Objective   Vital signs in last 24 hours: Temp:  [97.9 F (36.6 C)-98.3 F (36.8 C)] 98.2 F (36.8 C) (05/27 0744) Pulse Rate:  [75-90] 85 (05/27 0744) Resp:  [17-30] 18 (05/27 0744) BP: (97-118)/(60-79) 118/75 (05/27 0744) SpO2:  [77 %-98 %] 98 % (05/27 0744) FiO2 (%):  [28 %-100 %] 100 % (05/27 0735) Weight:  [128.5 kg-175.8 kg] 175.8 kg (05/27 0522) Last BM Date : 09/09/22 (pt was bowel prepped yesterday) General:  AA male in NAD Heart:  Regular rate and rhythm; no murmurs Lungs: Respirations even and unlabored, lungs CTA bilaterally +trach Abdomen:  Soft, nontender and nondistended. Normal bowel sounds. Psych:  Cooperative. Normal mood and affect.  Intake/Output from previous day: 05/26 0701 - 05/27 0700 In: 320 [P.O.:220; IV Piggyback:100] Out: 951 [Urine:950; Stool:1] Intake/Output this shift: Total I/O In: 240 [P.O.:240] Out: -   Lab Results: Recent Labs    09/08/22 0822 09/08/22 1733 09/10/22 0301  WBC 5.4 7.3 6.6  HGB 9.3* 8.9* 8.5*  HCT 35.2* 31.5* 31.6*  PLT 164 168 205   BMET Recent Labs    09/07/22 1548 09/08/22 0336 09/10/22 0301  NA 139 137 141  K 4.4 4.3 3.3*  CL 102 101 102  CO2 30 28 30   GLUCOSE 120* 111* 103*  BUN 26* 23 16  CREATININE 1.24 1.27* 1.47*  CALCIUM 7.9* 8.4* 7.7*   LFT Recent Labs    09/07/22 1548  PROT 6.8  ALBUMIN 2.5*  AST 12*  ALT 10  ALKPHOS 93  BILITOT 0.2*   PT/INR Recent Labs    09/07/22 1548 09/08/22 0336  LABPROT 14.3 14.3  INR 1.1 1.1    Assessment / Plan:   Assessment: 1.  GI bleed: Started 09/06/2022 per [facility] where patient was staying, mixed reports of bright red  blood and black tarry stool, hemoglobin fairly stable since admission with minimal signs of GI bleeding, CT angio with thickened appearing loop of the distal descending colon and proximal sigmoid colon concerning for acute on chronic diverticulitis, cannot rule out underlying malignancy, status post right hemicolectomy and reanastomosis, EGD 09/09/2022 with grade B esophagitis and otherwise normal, flex sig unable to be done at that time as patient was not appropriately prepped; consider lower GI source 2.  Morbid obesity with OSA, chronic toxic and hypercapnic respiratory failure with trach collar 3.  CKD stage III 4.  Chronic diastolic CHF and EF of 65% 5.  History of DVT: On Eliquis, last dose documented 09/06/2022 PM 6. Hypokalemia: has been addressed by primary team  Plan: 1.  Plans for flex sigmoidoscopy tomorrow with Dr. Meridee Score.  Did review risks, benefits, limitations and alternatives and the patient agrees to proceed. 2.  Patient will be changed back to a clear liquid diet for now and n.p.o. at midnight. 3.  Continue to monitor hemoglobin with transfusion as needed less than 7 4.  Will go ahead and add back in movi prep to be started this evening at 3:00 in split dose fashion.  Patient does not want to drink this.  We agreed that as long as he gets half of it in, that would  be helpful. 5.  Patient will also have 2 tapwater enemas before time of procedure tomorrow.  Hopefully he will be better cleaned out.  Thank you for your kind consultation.  Dr. Meridee Score will resume patient's care tomorrow.   LOS: 3 days   Unk Lightning  09/10/2022, 10:02 AM

## 2022-09-10 NOTE — Progress Notes (Signed)
   09/09/22 2355  Assess: MEWS Score  BP 110/66  Pulse Rate 88  Resp (!) 26  SpO2 93 %  O2 Device Tracheostomy Collar  O2 Flow Rate (L/min) 8 L/min  FiO2 (%) 30 %  Assess: MEWS Score  MEWS Temp 0  MEWS Systolic 0  MEWS Pulse 0  MEWS RR 2  MEWS LOC 0  MEWS Score 2  MEWS Score Color Yellow  Assess: if the MEWS score is Yellow or Red  Were vital signs taken at a resting state? Yes  Focused Assessment No change from prior assessment  Does the patient meet 2 or more of the SIRS criteria? No  MEWS guidelines implemented  No, other (Comment) (pt has a trache, sometimes he will be SOB)  Assess: SIRS CRITERIA  SIRS Temperature  0  SIRS Pulse 0  SIRS Respirations  1  SIRS WBC 0  SIRS Score Sum  1

## 2022-09-10 NOTE — Progress Notes (Signed)
   09/10/22 0429  Assess: MEWS Score  Pulse Rate 86  Resp (!) 30  SpO2 92 %  O2 Device Tracheostomy Collar  O2 Flow Rate (L/min) 8 L/min  FiO2 (%) 30 %  Assess: MEWS Score  MEWS Temp 0  MEWS Systolic 0  MEWS Pulse 0  MEWS RR 2  MEWS LOC 0  MEWS Score 2  MEWS Score Color Yellow  Assess: if the MEWS score is Yellow or Red  Were vital signs taken at a resting state? Yes  Focused Assessment No change from prior assessment  Does the patient meet 2 or more of the SIRS criteria? No  MEWS guidelines implemented  No, previously yellow, continue vital signs every 4 hours  Assess: SIRS CRITERIA  SIRS Temperature  0  SIRS Pulse 0  SIRS Respirations  1  SIRS WBC 0  SIRS Score Sum  1

## 2022-09-11 ENCOUNTER — Encounter (HOSPITAL_COMMUNITY): Payer: Self-pay | Admitting: Internal Medicine

## 2022-09-11 ENCOUNTER — Inpatient Hospital Stay (HOSPITAL_COMMUNITY): Payer: Medicare Other | Admitting: Anesthesiology

## 2022-09-11 ENCOUNTER — Encounter (HOSPITAL_COMMUNITY)
Admission: EM | Disposition: A | Discharge status: Rehabilitation Facility | Payer: Self-pay | Source: Other Acute Inpatient Hospital | Attending: Family Medicine

## 2022-09-11 DIAGNOSIS — D500 Iron deficiency anemia secondary to blood loss (chronic): Secondary | ICD-10-CM

## 2022-09-11 DIAGNOSIS — I110 Hypertensive heart disease with heart failure: Secondary | ICD-10-CM

## 2022-09-11 DIAGNOSIS — K921 Melena: Secondary | ICD-10-CM | POA: Diagnosis not present

## 2022-09-11 DIAGNOSIS — K5730 Diverticulosis of large intestine without perforation or abscess without bleeding: Secondary | ICD-10-CM

## 2022-09-11 DIAGNOSIS — Z980 Intestinal bypass and anastomosis status: Secondary | ICD-10-CM

## 2022-09-11 DIAGNOSIS — K922 Gastrointestinal hemorrhage, unspecified: Secondary | ICD-10-CM | POA: Diagnosis not present

## 2022-09-11 DIAGNOSIS — K2950 Unspecified chronic gastritis without bleeding: Secondary | ICD-10-CM

## 2022-09-11 DIAGNOSIS — K529 Noninfective gastroenteritis and colitis, unspecified: Secondary | ICD-10-CM

## 2022-09-11 DIAGNOSIS — K641 Second degree hemorrhoids: Secondary | ICD-10-CM

## 2022-09-11 HISTORY — PX: BIOPSY: SHX5522

## 2022-09-11 HISTORY — PX: HEMOSTASIS CLIP PLACEMENT: SHX6857

## 2022-09-11 HISTORY — PX: COLONOSCOPY: SHX5424

## 2022-09-11 LAB — CBC WITH DIFFERENTIAL/PLATELET
Abs Immature Granulocytes: 0.02 10*3/uL (ref 0.00–0.07)
Basophils Absolute: 0 10*3/uL (ref 0.0–0.1)
Basophils Relative: 1 %
Eosinophils Absolute: 0.2 10*3/uL (ref 0.0–0.5)
Eosinophils Relative: 2 %
HCT: 30.2 % — ABNORMAL LOW (ref 39.0–52.0)
Hemoglobin: 8 g/dL — ABNORMAL LOW (ref 13.0–17.0)
Immature Granulocytes: 0 %
Lymphocytes Relative: 19 %
Lymphs Abs: 1.3 10*3/uL (ref 0.7–4.0)
MCH: 20.9 pg — ABNORMAL LOW (ref 26.0–34.0)
MCHC: 26.5 g/dL — ABNORMAL LOW (ref 30.0–36.0)
MCV: 78.9 fL — ABNORMAL LOW (ref 80.0–100.0)
Monocytes Absolute: 0.8 10*3/uL (ref 0.1–1.0)
Monocytes Relative: 12 %
Neutro Abs: 4.3 10*3/uL (ref 1.7–7.7)
Neutrophils Relative %: 66 %
Platelets: 196 10*3/uL (ref 150–400)
RBC: 3.83 MIL/uL — ABNORMAL LOW (ref 4.22–5.81)
RDW: 18.6 % — ABNORMAL HIGH (ref 11.5–15.5)
WBC: 6.5 10*3/uL (ref 4.0–10.5)
nRBC: 0.6 % — ABNORMAL HIGH (ref 0.0–0.2)

## 2022-09-11 LAB — C-REACTIVE PROTEIN: CRP: 2.7 mg/dL — ABNORMAL HIGH (ref <1.0)

## 2022-09-11 LAB — SEDIMENTATION RATE: Sed Rate: 34 mm/hr — ABNORMAL HIGH (ref 0–16)

## 2022-09-11 SURGERY — BIOPSY
Anesthesia: Monitor Anesthesia Care

## 2022-09-11 MED ORDER — FLEET ENEMA 7-19 GM/118ML RE ENEM
1.0000 | ENEMA | Freq: Once | RECTAL | Status: AC
  Administered 2022-09-11: 1 via RECTAL

## 2022-09-11 MED ORDER — PHENYLEPHRINE 80 MCG/ML (10ML) SYRINGE FOR IV PUSH (FOR BLOOD PRESSURE SUPPORT)
PREFILLED_SYRINGE | INTRAVENOUS | Status: DC | PRN
  Administered 2022-09-11: 120 ug via INTRAVENOUS

## 2022-09-11 MED ORDER — SODIUM CHLORIDE 0.9 % IV SOLN: INTRAVENOUS | Status: DC

## 2022-09-11 MED ORDER — PROPOFOL 500 MG/50ML IV EMUL
INTRAVENOUS | Status: DC | PRN
  Administered 2022-09-11: 150 ug/kg/min via INTRAVENOUS

## 2022-09-11 MED ORDER — FLEET ENEMA 7-19 GM/118ML RE ENEM
ENEMA | RECTAL | Status: AC
  Filled 2022-09-11: qty 1

## 2022-09-11 MED ORDER — LIDOCAINE 2% (20 MG/ML) 5 ML SYRINGE
INTRAMUSCULAR | Status: DC | PRN
  Administered 2022-09-11: 60 mg via INTRAVENOUS

## 2022-09-11 MED ORDER — AMOXICILLIN-POT CLAVULANATE 875-125 MG PO TABS: 1.0000 | ORAL_TABLET | Freq: Two times a day (BID) | ORAL | Status: DC

## 2022-09-11 MED ORDER — EPHEDRINE SULFATE-NACL 50-0.9 MG/10ML-% IV SOSY
PREFILLED_SYRINGE | INTRAVENOUS | Status: DC | PRN
  Administered 2022-09-11: 10 mg via INTRAVENOUS

## 2022-09-11 MED ORDER — LACTATED RINGERS IV SOLN
INTRAVENOUS | Status: AC | PRN
  Administered 2022-09-11: 1000 mL via INTRAVENOUS

## 2022-09-11 NOTE — Progress Notes (Signed)
RT note. RT called to patient bedside at PACU due to desaturation episodes.  Upon arrival patient on 100%-12L ATC sat 95% with no labored breathing noted. Patient placed on venti mask 55%-12L sat 97% with no labored breathing noted.  Inner cannula changed at this time, guaze applied under trach. New trach collar/water bottle/ tubing set up in patients room for arrival. RT will continue to monitor.    09/11/22 1116  Therapy Vitals  Pulse Rate 89  Resp (!) 23  BP (!) 100/58  MEWS Score/Color  MEWS Score 2  MEWS Score Color Yellow  Respiratory Assessment  Assessment Type Assess only  Respiratory Pattern Regular;Unlabored  Chest Assessment Chest expansion symmetrical  Cough Strong;Productive  Sputum Amount Small  Sputum Color Tan  Sputum Consistency Thick  Sputum Specimen Source Spontaneous cough  Bilateral Breath Sounds Diminished  Oxygen Therapy/Pulse Ox  O2 Device Tracheostomy Collar  O2 Therapy Oxygen  O2 Flow Rate (L/min) 15 L/min  FiO2 (%) (S)  55 %  SpO2 97 %  Tracheostomy Shiley XLT Proximal 7 mm Uncuffed  Placement Date/Time: 08/12/22 1815   Inserted prior to hospital arrival?: Placed at another facility  Brand: Shiley XLT Proximal  Size (mm): 7 mm  Style: Uncuffed  Status Secured with trach ties;Passy Muir Speaking valve  Site Care (S)  Dressing applied  Inner Cannula Care (S)  Changed/new  Ties Assessment Dry  Respiratory  Airway LDA Tracheostomy

## 2022-09-11 NOTE — Progress Notes (Signed)
Tap water enema x2 this am. Pt tolerated. Output yellow with food particles. Remains NPO for GI procedure later this morning.

## 2022-09-11 NOTE — Op Note (Addendum)
[facility] Patient Name: Gary [name] Procedure Date : 09/11/2022 MRN: [id number] Attending MD: Corliss Parish , MD, [id number] Date of Birth: 21-Feb-1956 CSN: [id number] Age: 67 Admit Type: Inpatient Procedure:                Colonoscopy Indications:              Hematochezia, Iron deficiency anemia secondary to                            chronic blood loss Providers:                Corliss Parish, MD, Lorenza Evangelist, RN,                            Kandice Robinsons, Technician Referring MD:             Inpatient medical service Medicines:                Monitored Anesthesia Care, General Anesthesia Complications:            No immediate complications. Estimated Blood Loss:     Estimated blood loss was minimal. Procedure:                Pre-Anesthesia Assessment:                           - Prior to the procedure, a History and Physical                            was performed, and patient medications and                            allergies were reviewed. The patient's tolerance of                            previous anesthesia was also reviewed. The risks                            and benefits of the procedure and the sedation                            options and risks were discussed with the patient.                            All questions were answered, and informed consent                            was obtained. Prior Anticoagulants: The patient has                            taken Eliquis (apixaban), last dose was 2 days                            prior to procedure. ASA Grade Assessment: III - A  patient with severe systemic disease. After                            reviewing the risks and benefits, the patient was                            deemed in satisfactory condition to undergo the                            procedure.                           After obtaining informed consent, the colonoscope                             was passed under direct vision. Throughout the                            procedure, the patient's blood pressure, pulse, and                            oxygen saturations were monitored continuously. The                            CF-HQ190L ([id number] Olympus coloscope was                            introduced through the anus and advanced to the the                            ileocolonic anastomosis. After obtaining informed                            consent, the colonoscope was passed under direct                            vision. Throughout the procedure, the patient's                            blood pressure, pulse, and oxygen saturations were                            monitored continuously.The colonoscopy was                            technically difficult and complex due to the                            patient's respiratory instability. Successful                            completion of the procedure was aided by managing                            the patient's medical instability.  The patient                            tolerated the procedure. The quality of the bowel                            preparation was fair. Scope In: 9:51:42 AM Scope Out: 10:06:05 AM Total Procedure Duration: 0 hours 14 minutes 23 seconds  Findings:      The digital rectal exam findings include hemorrhoids. Pertinent       negatives include no palpable rectal lesions.      There was evidence of a prior functional end-to-end ileo-colonic       anastomosis in the transverse colon. This was characterized by healthy       appearing mucosa.      Extensive amounts of liquid semi-liquid stool was found in the entire       colon, interfering with visualization. Lavage of the area was performed       using copious amounts, resulting in clearance with fair visualization.      Many medium-mouthed and small-mouthed diverticula were found in the       recto-sigmoid colon, sigmoid colon and descending  colon.      A single medium-mouthed diverticulum was found in the sigmoid colon.       Within this diverticulum, there was significant granularity and abnormal       appearing mucosa. It was not clearly dysplastic in appearance but did       suggest inflammation and potentially recent or active diverticulitis (no       evidence of pus). Biopsies were taken with a cold forceps for histology       from within the diverticulum. For location marking, one hemostatic clip       was successfully placed (MR conditional). Clip manufacturer: [employer]. There was no bleeding during, or at the end, of the       procedure.      Inflammation characterized by adherent blood, altered vascularity,       congestion (edema), friability and granularity was found in a continuous       and circumferential pattern from the recto-sigmoid colon to the splenic       flexure. The rectum and the transverse colon were spared. The       inflammation was moderate in severity and severe. Biopsies were taken       with a cold forceps for histology to rule out chronic colitis versus       SCAD (as diverticula were noted in different regions).      Non-bleeding non-thrombosed external and internal hemorrhoids were found       during retroflexion, during perianal exam and during digital exam. The       hemorrhoids were Grade II (internal hemorrhoids that prolapse but reduce       spontaneously). Impression:               - Preparation of the colon was fair even after                            extensive lavage.                           - Hemorrhoids  found on digital rectal exam.                           - Functional end-to-end ileo-colonic anastomosis,                            characterized by healthy appearing mucosa.                           - Diverticulosis in the recto-sigmoid colon, in the                            sigmoid colon and in the descending colon.                           - Diverticulosis  in the sigmoid colon. Biopsied.                            Clip (MR conditional) was placed. Clip                            manufacturer: [employer].                           - What appears to be colitis changes are noted in                            the recto-sigmoid colon to the splenic flexure.                            This was moderate in severity and severe. Biopsied                            to rule out chronicity versus SCAD.                           - Non-bleeding non-thrombosed external and internal                            hemorrhoids. Recommendation:           - The patient will be observed post-procedure,                            until all discharge criteria are met.                           - Return patient to hospital ward for ongoing care.                           - Advance diet as tolerated.                           - Send ESR/CRP.                           -  Complete 10 days of antibiotics for potential                            diverticulitis. If patient is tolerating IV                            antibiotics as he is doing Mosetta Putt can be transition                            to Augmentin or Cipro/Flagyl.                           - Await pathology results.                           - Consider 5-ASA therapy pending pathology if the                            patient has evidence of chronic colitis or SCAD.                           - Consideration of restart of anticoagulation in 72                            to 96 hours pending pathology is completed and                            returns.                           - KUB 2 view to be obtained to see demarcation of                            hemostatic clip.                           - The findings and recommendations were discussed                            with the patient.                           - The findings and recommendations were discussed                            with the referring  physician. Procedure Code(s):        --- Professional ---                           [phone], Colonoscopy, flexible; with biopsy, single                            or multiple Diagnosis Code(s):        --- Professional ---  K64.1, Second degree hemorrhoids                           Z98.0, Intestinal bypass and anastomosis status                           K52.9, Noninfective gastroenteritis and colitis,                            unspecified                           K92.1, Melena (includes Hematochezia)                           D50.0, Iron deficiency anemia secondary to blood                            loss (chronic)                           K57.30, Diverticulosis of large intestine without                            perforation or abscess without bleeding CPT copyright 2022 [employer]. All rights reserved. The codes documented in this report are preliminary and upon coder review may  be revised to meet current compliance requirements. Corliss Parish, MD 09/11/2022 11:24:16 AM Number of Addenda: 0

## 2022-09-11 NOTE — Anesthesia Preprocedure Evaluation (Signed)
Anesthesia Evaluation  Patient identified by MRN, date of birth, ID band Patient awake    Reviewed: Allergy & Precautions, NPO status , Patient's Chart, lab work & pertinent test results  Airway Mallampati: Trach  TM Distance: >3 FB Neck ROM: Full    Dental   Pulmonary sleep apnea and Oxygen sleep apnea , COPD,  COPD inhaler and oxygen dependent, former smoker tracheostomy  trach   + decreased breath sounds      Cardiovascular hypertension, +CHF   Rhythm:Irregular   1. Left ventricular ejection fraction, by estimation, is 65 to 70%. The  left ventricle has normal function. The left ventricle has no regional  wall motion abnormalities. There is mild left ventricular hypertrophy.  Left ventricular diastolic parameters  were normal.   2. Right ventricular systolic function is normal. The right ventricular  size is normal.   3. The mitral valve is normal in structure. No evidence of mitral valve  regurgitation. No evidence of mitral stenosis.   4. The aortic valve has an indeterminant number of cusps. Aortic valve  regurgitation is not visualized. No aortic stenosis is present.     Neuro/Psych  PSYCHIATRIC DISORDERS  Depression    negative neurological ROS     GI/Hepatic Neg liver ROS, PUD,GERD  Controlled and Medicated,,  Endo/Other  negative endocrine ROS    Renal/GU Renal InsufficiencyRenal diseaseLab Results      Component                Value               Date                      CREATININE               1.47 (H)            09/10/2022                Musculoskeletal  (+) Arthritis ,    Abdominal  (+) + obese  Peds  Hematology  (+) Blood dyscrasia (Eliquis), anemia Lab Results      Component                Value               Date                      WBC                      6.5                 09/11/2022                HGB                      8.0 (L)             09/11/2022                HCT                       30.2 (L)            09/11/2022                MCV  78.9 (L)            09/11/2022                PLT                      196                 09/11/2022              Anesthesia Other Findings Anemia GI Bleed  Reproductive/Obstetrics                              Anesthesia Physical Anesthesia Plan  ASA: 4  Anesthesia Plan: MAC   Post-op Pain Management:    Induction: Intravenous  PONV Risk Score and Plan: 1 and Treatment may vary due to age or medical condition  Airway Management Planned: Tracheostomy  Additional Equipment:   Intra-op Plan:   Post-operative Plan:   Informed Consent: I have reviewed the patients History and Physical, chart, labs and discussed the procedure including the risks, benefits and alternatives for the proposed anesthesia with the patient or authorized representative who has indicated his/her understanding and acceptance.     Dental advisory given  Plan Discussed with: CRNA  Anesthesia Plan Comments:          Anesthesia Quick Evaluation

## 2022-09-11 NOTE — Progress Notes (Signed)
Physical Therapy Discharge Patient Details Name: Gary [name] MRN: [id number] DOB: May 16, 1955 Today's Date: 09/11/2022 Time:  -     Patient discharged from PT services secondary to patient has refused 3 (three) consecutive times without medical reason.  Please see latest therapy progress note for current level of functioning and progress toward goals.    Progress and discharge plan discussed with patient and/or caregiver: Patient/Caregiver agrees with plan  GP     Gladys Damme 09/11/2022, 2:03 PM

## 2022-09-11 NOTE — Progress Notes (Signed)
Occupational Therapy Discharge Patient Details Name: BRAYSON SHERFEY MRN: [id number] DOB: [birth year] Today's Date: 09/11/2022 Time:  -     Patient discharged from OT services secondary to patient has refused 3 (three) consecutive times without medical reason.  Please see latest therapy progress note for current level of functioning and progress toward goals.    Progress and discharge plan discussed with patient and/or caregiver: Patient/Caregiver agrees with plan  GO     Galen Manila 09/11/2022, 2:22 PM

## 2022-09-11 NOTE — Progress Notes (Signed)
PROGRESS NOTE    Gary [name]  ZOX:[id number] DOB: March 30, 1956 DOA: 09/07/2022 PCP: [facility]   Brief Narrative:  Gary [name]  is a 67 y.o. 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  No reports of abdominal pain, fever, chills or hematemesis.  He is largely bedbound but can walk a few steps with a walker.  He was admitted to hospitalist service and GI, IR as well as PCCM were consulted.  Assessment & Plan:   Principal Problem:   Lower GI bleed Active Problems:   Anticoagulated   Acute on chronic diastolic (congestive) heart failure ([name])   Chronic kidney disease, stage 3a ([name])   Chronic obstructive pulmonary disease (COPD) ([name])   Morbid obesity with BMI of 50.0-59.9, adult ([name])   GERD without esophagitis   HLD (hyperlipidemia)   Hyperlipidemia   Acute respiratory failure with hypoxia and hypercapnia ([name])   Anemia, posthemorrhagic, acute   Diverticulosis of colon with hemorrhage   Abnormal CT scan, gastrointestinal tract  Bright red blood per rectum/anemia of chronic disease: Patient's baseline hemoglobin appears to be around 10, currently 8.9 from last evening (morning labs are not drawn for some reason).  Not much drop in hemoglobin.  CTA negative for active bleed.  No intervention by IR needed at this point in time.  CT abdomen however shows acute on  chronic diverticulitis, some concern of malignancy.  Continue Rocephin and Flagyl.  GI on board.  Underwent EGD 09/09/2022 which showed reflux esophagitis with no bleeding site.  Underwent colonoscopy by Dr. Meridee Score 09/03/2022 which shows hemorrhoids, possibility of chronic colitis as well as acute diverticulitis.  No active bleeding site.  Biopsy taken, pathology pending.  Will advance diet per GI recommendations and continue antibiotics and complete 10-day course.  Will transition to oral antibiotics tomorrow.  Continue IV for now.  Hypokalemia: Resolved.  ?  Abdominal aortic aneurysm vs microdissection: Seen by with neurosurgery/Dr. Edilia Bo, outpatient follow-up will be arranged by them.  Morbid obesity with history of OSA as well as chronic hypoxic and hypercapnic respiratory failure, chronically on vent: Apparently, he has not been connected to the vent or required any vent since he has been discharged to [facility] few weeks ago.  PCCM has verified that from [facility].  CKD stage IIIa: At baseline.  Chronic diastolic CHF: Recent echo showed ejection fraction of 65%.  Appears euvolemic.  Continue Lasix per patient's request.  History of hypotension: Continue midodrine.  History of DVT: Eliquis on hold due to bright red blood per rectum.  Will resume once cleared by GI.  History of depression: Continue Lexapro .    DVT prophylaxis: SCDs Start: 09/07/22 1720   Code Status: Full Code  Family Communication:  None present at bedside.  Plan of care discussed with patient in length and he/she verbalized understanding and agreed with it.  Status is: Inpatient Remains inpatient appropriate because: Needs 24-hour observation per  GI recommendation.  Plan for discharge tomorrow to rehab.   Estimated body mass index is 49.32 kg/m as calculated from the following:   Height as of this encounter: 6\' 3"  (1.905 m).   Weight as of this encounter: 179 kg.    Nutritional Assessment: Body mass index is  49.32 kg/m.[name] Seen by dietician.  I agree with the assessment and plan as outlined below: Nutrition Status:        . Skin Assessment: I have examined the patient's skin and I agree with the wound assessment as performed by the wound care RN as outlined below:    Consultants:  GI and PCCM  Procedures:  As above  Antimicrobials:  Anti-infectives (From admission, onward)    Start     Dose/Rate Route Frequency Ordered Stop   09/11/22 1130  amoxicillin-clavulanate (AUGMENTIN) 875-125 MG per tablet 1 tablet  Status:  Discontinued        1 tablet Oral Every 12 hours 09/11/22 1118 09/11/22 1118   09/08/22 0815  cefTRIAXone (ROCEPHIN) 2 g in sodium chloride 0.9 % 100 mL IVPB        2 g 200 mL/hr over 30 Minutes Intravenous Every 24 hours 09/08/22 0725     09/08/22 0815  metroNIDAZOLE (FLAGYL) IVPB 500 mg        500 mg 100 mL/hr over 60 Minutes Intravenous Every 12 hours 09/08/22 0725           Subjective: Patient seen and examined before or going for endoscopy today.  He had no complaints.  No reports of rectal bleeding anymore since admission.  Objective: Vitals:   09/11/22 1100 09/11/22 1115 09/11/22 1116 09/11/22 1132  BP: (!) 98/57 (!) 100/58 (!) 100/58 100/66  Pulse: 90 88 89 88  Resp: (!) 30 20 (!) 23 20  Temp:  97.9 F (36.6 C)  98 F (36.7 C)  TempSrc:    Oral  SpO2: 96% 98% 97% 97%  Weight:      Height:        Intake/Output Summary (Last 24 hours) at 09/11/2022 1141 Last data filed at 09/11/2022 1045 Gross per 24 hour  Intake 1660 ml  Output 1501 ml  Net 159 ml    Filed Weights   09/10/22 0522 09/11/22 0423 09/11/22 0758  Weight: (!) 175.8 kg (!) 179 kg (!) 179 kg    Examination:  General exam: Appears calm and comfortable  Respiratory system: Clear to auscultation. Respiratory effort normal. Cardiovascular system: S1 & S2 heard, RRR. No JVD, murmurs, rubs, gallops or clicks. No pedal edema. Gastrointestinal system: Abdomen is nondistended, soft  and nontender. No organomegaly or masses felt. Normal bowel sounds heard. Central nervous system: Alert and oriented. No focal neurological deficits. Extremities: Symmetric 5 x 5 power. Skin: No rashes, lesions or ulcers.    Data Reviewed: I have personally reviewed following labs and imaging studies  CBC: Recent Labs  Lab 09/07/22 1548 09/07/22 1954 09/08/22 0336 09/08/22 0822 09/08/22 1733 09/10/22 0301 09/11/22 0035  WBC 8.7   < > 6.3 5.4 7.3 6.6 6.5  NEUTROABS 6.2  --   --   --  4.8 4.1 4.3  HGB 8.8*   < > 9.1* 9.3* 8.9* 8.5* 8.0*  HCT 34.3*   < > 34.4* 35.2* 31.5* 31.6* 30.2*  MCV 80.5   < > 79.4* 78.9* 76.6* 80.8 78.9*  PLT 182   < > 165 164 168 205 196   < > = values in this interval not  displayed.    Basic Metabolic Panel: Recent Labs  Lab 09/07/22 1548 09/08/22 0336 09/10/22 0301  NA 139 137 141  K 4.4 4.3 3.3*  CL 102 101 102  CO2 30 28 30   GLUCOSE 120* 111* 103*  BUN 26* 23 16  CREATININE 1.24 1.27* 1.47*  CALCIUM 7.9* 8.4* 7.7*  MG  --  2.1  --     GFR: Estimated Creatinine Clearance: 86.7 mL/min (A) (by C-G formula based on SCr of 1.47 mg/dL (H)). Liver Function Tests: Recent Labs  Lab 09/07/22 1548  AST 12*  ALT 10  ALKPHOS 93  BILITOT 0.2*  PROT 6.8  ALBUMIN 2.5*    No results for input(s): "LIPASE", "AMYLASE" in the last 168 hours. No results for input(s): "AMMONIA" in the last 168 hours. Coagulation Profile: Recent Labs  Lab 09/07/22 1548 09/08/22 0336  INR 1.1 1.1    Cardiac Enzymes: No results for input(s): "CKTOTAL", "CKMB", "CKMBINDEX", "TROPONINI" in the last 168 hours. BNP (last 3 results) No results for input(s): "PROBNP" in the last 8760 hours. HbA1C: No results for input(s): "HGBA1C" in the last 72 hours. CBG: No results for input(s): "GLUCAP" in the last 168 hours. Lipid Profile: No results for input(s): "CHOL", "HDL", "LDLCALC", "TRIG", "CHOLHDL", "LDLDIRECT" in the last 72 hours. Thyroid Function Tests: No  results for input(s): "TSH", "T4TOTAL", "FREET4", "T3FREE", "THYROIDAB" in the last 72 hours. Anemia Panel: No results for input(s): "VITAMINB12", "FOLATE", "FERRITIN", "TIBC", "IRON", "RETICCTPCT" in the last 72 hours. Sepsis Labs: No results for input(s): "PROCALCITON", "LATICACIDVEN" in the last 168 hours.  Recent Results (from the past 240 hour(s))  MRSA Next Gen by PCR, Nasal     Status: None   Collection Time: 09/07/22  9:14 PM   Specimen: Nasal Mucosa; Nasal Swab  Result Value Ref Range Status   MRSA by PCR Next Gen NOT DETECTED NOT DETECTED Final    Comment: (NOTE) The GeneXpert MRSA Assay (FDA approved for NASAL specimens only), is one component of a comprehensive MRSA colonization surveillance program. It is not intended to diagnose MRSA infection nor to guide or monitor treatment for MRSA infections. Test performance is not FDA approved in patients less than 32 years old. Performed at [facility], [address], [place], [place] [address]      Radiology Studies: No results found.  Scheduled Meds:  Chlorhexidine Gluconate Cloth  6 each Topical Daily   docusate sodium  100 mg Oral Daily   escitalopram  5 mg Oral Daily   furosemide  40 mg Intravenous BID   midodrine  10 mg Oral TID WC   pantoprazole (PROTONIX) IV  40 mg Intravenous Q12H   peg 3350 powder  0.5 kit Oral Once   Continuous Infusions:  cefTRIAXone (ROCEPHIN)  IV Stopped (09/10/22 1019)   metronidazole Stopped (09/10/22 2130)     LOS: 4 days   Hughie Closs, MD [facility]  09/11/2022, 11:41 AM   *Please note that this is a verbal dictation therefore any spelling or grammatical errors are due to the "Dragon Medical One" system interpretation.  Please page via Amion and do not message via secure chat for urgent patient care matters. Secure chat can be used for non urgent patient care matters.  How to contact the [facility] Attending or Consulting provider 7A - 7P or covering provider during  after hours 7P -7A, for this patient?  Check the care team in [facility] and look for a) attending/consulting TRH provider listed and b) the  TRH team listed. Page or secure chat 7A-7P. Log into [email or web address] and use [place]'s universal password to access. If you do not have the password, please contact the hospital operator. Locate the [facility] provider you are looking for under [facility] and page to a number that you can be directly reached. If you still have difficulty reaching the provider, please page the [facility] (Director on Call) for the Hospitalists listed on amion for assistance.

## 2022-09-11 NOTE — Progress Notes (Signed)
PT Cancellation Note  Patient Details Name: KYM DRAWDY MRN: [id number] DOB: 23-Apr-1955   Cancelled Treatment:    Reason Eval/Treat Not Completed: Patient at procedure or test/unavailable (Pt off the floor at colonoscopy. Will follow up if time allows.)   Gladys Damme 09/11/2022, 9:53 AM

## 2022-09-11 NOTE — Progress Notes (Signed)
OT Cancellation Note  Patient Details Name: Gary [name] MRN: [id number] DOB: [birth year]   Cancelled Treatment:    Reason Eval/Treat Not Completed: Patient at procedure or test/ unavailable (pt off unit for colonoscopy. OT will follow up as appropriate)  Galen Manila 09/11/2022, 9:14 AM

## 2022-09-11 NOTE — Progress Notes (Signed)
PT Cancellation Note  Patient Details Name: NASIR CAM MRN: [id number] DOB: [birth year]   Cancelled Treatment:    Reason Eval/Treat Not Completed: Patient declined, no reason specified (Per RN pt states he will not participate in therapy today. This is pt 3rd time refusing so PT will sign off. Reconsult PT if mobility status changes.)   Gladys Damme 09/11/2022, 2:02 PM

## 2022-09-11 NOTE — TOC Progression Note (Signed)
Transition of Care [facility]) - Progression Note    Patient Details  Name: LENY ONDER MRN: [id number] Date of Birth: 06/10/1955  [facility] CM/SW Contact  [name], RN Phone Number: 09/11/2022, 12:04 PM  Clinical Narrative:    Per MD plan for dc tomorrow, to [facility] inpatient rehab via ambulance transport.  NCM confirmed this with Crystal with [facility] [phone].  NCM also notified [name] with [facility] of this information as well.  Will need to call Crystal at [facility] to let her know what time the ambulance will be transporting patient to them tomorrow.    Expected Discharge Plan: IP Rehab Facility Barriers to Discharge: Continued Medical Work up  Expected Discharge Plan and Services                                               Social Determinants of Health (SDOH) Interventions SDOH Screenings   Food Insecurity: No Food Insecurity (09/07/2022)  Housing: Low Risk  (09/07/2022)  Transportation Needs: Unmet Transportation Needs (08/13/2022)  Utilities: Not At Risk (08/13/2022)  Depression (PHQ2-9): Low Risk  (06/07/2021)  Financial Resource Strain: Medium Risk (05/03/2017)  Physical Activity: Insufficiently Active (05/03/2017)  Social Connections: Moderately Integrated (05/03/2017)  Stress: No Stress Concern Present (05/03/2017)  Tobacco Use: Medium Risk (09/11/2022)    Readmission Risk Interventions    12/31/2021    8:58 AM 12/20/2021    4:21 PM 05/04/2021    1:26 PM  Readmission Risk Prevention Plan  Transportation Screening Complete Complete Complete  Medication Review Oceanographer) Complete Complete Complete  PCP or Specialist appointment within 3-5 days of discharge Complete Complete Complete  HRI or Home Care Consult Complete Complete Complete  SW Recovery Care/Counseling Consult Complete Complete Complete  Palliative Care Screening Complete Complete Not Applicable  Skilled Nursing Facility Not Applicable Not Applicable Not Applicable

## 2022-09-11 NOTE — Interval H&P Note (Signed)
History and Physical Interval Note:  09/11/2022 7:59 AM  Gary [name]  has presented today for surgery, with the diagnosis of Gi Bleed, Anemia, Abnormal CT abdomen.  The various methods of treatment have been discussed with the patient and family. After consideration of risks, benefits and other options for treatment, the patient has consented to  Procedure(s): FLEXIBLE SIGMOIDOSCOPY (N/A)/COLONOSCOPY as a surgical intervention.  The patient's history has been reviewed, patient examined, no change in status, stable for surgery.  I have reviewed the patient's chart and labs.  Questions were answered to the patient's satisfaction.     [employer]

## 2022-09-11 NOTE — Progress Notes (Signed)
RT note. Anesthesia/ RN request of patient to have cuffed trach at bedside just in case.  Patients # 7 Shiley XLT cuffed brought to PACU bedside as back up.  RT will continue to monitor.

## 2022-09-11 NOTE — Progress Notes (Signed)
OT Cancellation Note  Patient Details Name: Gary [name] MRN: [id number] DOB: May 04, 1955   Cancelled Treatment:    Reason Eval/Treat Not Completed: Patient declined, no reason specified. Per RN pt states he will not participate in therapy today. This is pt 3rd time refusing so OT will sign off. Reconsult if pt status changes and pt agreeable  Galen Manila 09/11/2022, 2:21 PM

## 2022-09-11 NOTE — Transfer of Care (Signed)
Immediate Anesthesia Transfer of Care Note  Patient: Gary [name]  Procedure(s) Performed: BIOPSY HEMOSTASIS CLIP PLACEMENT COLONOSCOPY  Patient Location: PACU  Anesthesia Type:MAC  Level of Consciousness: awake, alert , and oriented  Airway & Oxygen Therapy: Patient Spontanous Breathing and Patient connected to T-piece oxygen  Post-op Assessment: Report given to RN and Post -op Vital signs reviewed and stable  Post vital signs: Reviewed and stable  Last Vitals:  Vitals Value Taken Time  BP 107/61 09/11/22 1043  Temp    Pulse 98 09/11/22 1044  Resp 18 09/11/22 1044  SpO2 94 % 09/11/22 1044  Vitals shown include unvalidated device data.  Last Pain:  Vitals:   09/11/22 0758  TempSrc: Tympanic  PainSc: 0-No pain         Complications: No notable events documented.

## 2022-09-12 ENCOUNTER — Inpatient Hospital Stay (HOSPITAL_COMMUNITY): Payer: Medicare Other

## 2022-09-12 DIAGNOSIS — K5010 Crohn's disease of large intestine without complications: Secondary | ICD-10-CM

## 2022-09-12 DIAGNOSIS — K5731 Diverticulosis of large intestine without perforation or abscess with bleeding: Secondary | ICD-10-CM

## 2022-09-12 DIAGNOSIS — K219 Gastro-esophageal reflux disease without esophagitis: Secondary | ICD-10-CM | POA: Diagnosis not present

## 2022-09-12 DIAGNOSIS — E785 Hyperlipidemia, unspecified: Secondary | ICD-10-CM | POA: Diagnosis not present

## 2022-09-12 DIAGNOSIS — N1831 Chronic kidney disease, stage 3a: Secondary | ICD-10-CM | POA: Diagnosis not present

## 2022-09-12 DIAGNOSIS — K922 Gastrointestinal hemorrhage, unspecified: Secondary | ICD-10-CM | POA: Diagnosis not present

## 2022-09-12 DIAGNOSIS — K529 Noninfective gastroenteritis and colitis, unspecified: Secondary | ICD-10-CM

## 2022-09-12 LAB — BASIC METABOLIC PANEL
Anion gap: 7 (ref 5–15)
BUN: 8 mg/dL (ref 8–23)
CO2: 31 mmol/L (ref 22–32)
Calcium: 8 mg/dL — ABNORMAL LOW (ref 8.9–10.3)
Chloride: 100 mmol/L (ref 98–111)
Creatinine, Ser: 1.27 mg/dL — ABNORMAL HIGH (ref 0.61–1.24)
GFR, Estimated: >60 mL/min (ref >60)
Glucose, Bld: 100 mg/dL — ABNORMAL HIGH (ref 70–99)
Potassium: 3.2 mmol/L — ABNORMAL LOW (ref 3.5–5.1)
Sodium: 138 mmol/L (ref 135–145)

## 2022-09-12 LAB — CBC WITH DIFFERENTIAL/PLATELET
Abs Immature Granulocytes: 0.02 10*3/uL (ref 0.00–0.07)
Basophils Absolute: 0 10*3/uL (ref 0.0–0.1)
Basophils Relative: 0 %
Eosinophils Absolute: 0.1 10*3/uL (ref 0.0–0.5)
Eosinophils Relative: 3 %
HCT: 30.8 % — ABNORMAL LOW (ref 39.0–52.0)
Hemoglobin: 8 g/dL — ABNORMAL LOW (ref 13.0–17.0)
Immature Granulocytes: 0 %
Lymphocytes Relative: 19 %
Lymphs Abs: 1 10*3/uL (ref 0.7–4.0)
MCH: 20.9 pg — ABNORMAL LOW (ref 26.0–34.0)
MCHC: 26 g/dL — ABNORMAL LOW (ref 30.0–36.0)
MCV: 80.4 fL (ref 80.0–100.0)
Monocytes Absolute: 0.8 10*3/uL (ref 0.1–1.0)
Monocytes Relative: 14 %
Neutro Abs: 3.6 10*3/uL (ref 1.7–7.7)
Neutrophils Relative %: 64 %
Platelets: 201 10*3/uL (ref 150–400)
RBC: 3.83 MIL/uL — ABNORMAL LOW (ref 4.22–5.81)
RDW: 18.6 % — ABNORMAL HIGH (ref 11.5–15.5)
WBC: 5.6 10*3/uL (ref 4.0–10.5)
nRBC: 1.1 % — ABNORMAL HIGH (ref 0.0–0.2)

## 2022-09-12 LAB — SURGICAL PATHOLOGY

## 2022-09-12 MED ORDER — AMOXICILLIN-POT CLAVULANATE 875-125 MG PO TABS: 1.0000 | ORAL_TABLET | Freq: Two times a day (BID) | ORAL | 0 refills | Status: AC

## 2022-09-12 MED ORDER — APIXABAN 2.5 MG PO TABS: 2.5000 mg | ORAL_TABLET | Freq: Two times a day (BID) | ORAL | 0 refills | Status: DC

## 2022-09-12 MED ORDER — MESALAMINE 1.2 G PO TBEC
2.4000 g | DELAYED_RELEASE_TABLET | Freq: Two times a day (BID) | ORAL | Status: DC
  Administered 2022-09-12: 2.4 g via ORAL
  Filled 2022-09-12 (×2): qty 2

## 2022-09-12 MED ORDER — MESALAMINE 1.2 G PO TBEC: 2.4000 g | DELAYED_RELEASE_TABLET | Freq: Two times a day (BID) | ORAL | 0 refills | Status: DC

## 2022-09-12 MED ORDER — SODIUM CHLORIDE 0.9 % IV SOLN
250.0000 mg | Freq: Once | INTRAVENOUS | Status: AC
  Administered 2022-09-12: 250 mg via INTRAVENOUS
  Filled 2022-09-12: qty 20

## 2022-09-12 MED ORDER — POTASSIUM CHLORIDE CRYS ER 20 MEQ PO TBCR
40.0000 meq | EXTENDED_RELEASE_TABLET | ORAL | Status: AC
  Administered 2022-09-12 (×2): 40 meq via ORAL
  Filled 2022-09-12 (×2): qty 2

## 2022-09-12 MED ORDER — FUROSEMIDE 80 MG PO TABS: 80.0000 mg | ORAL_TABLET | Freq: Two times a day (BID) | ORAL | 0 refills | Status: DC

## 2022-09-12 NOTE — Discharge Summary (Addendum)
Physician Discharge Summary  Gary [name] ZOX:[id number] DOB: 05/29/1955 DOA: 09/07/2022  PCP: [facility]  Admit date: 09/07/2022 Discharge date: 09/12/2022 30 Day Unplanned Readmission Risk [name]    Flowsheet Row ED to Hosp-Admission (Current) from 09/07/2022 in [facility] 3E HF PCU  30 Day Unplanned Readmission Risk [name] (%) 52.52 Filed at 09/12/2022 0801       This [name] is the patient's risk of an unplanned readmission within 30 days of being discharged (0 -100%). The [name] is based on dignosis, age, lab data, medications, orders, and past utilization.   Low:  0-14.9   Medium: 15-21.9   High: 22-29.9   Extreme: 30 and above          Admitted From: [facility] Disposition:  [facility] inpatient rehab   Recommendations for Outpatient Follow-up:  Follow up with PCP in 1-2 weeks Please obtain BMP/CBC in one week Follow-up with GI for pathology results Please follow up with your PCP on the following pending results: Unresulted Labs (From admission, onward)     Start     Ordered   09/11/22 0500  CBC with Differential/Platelet  Daily,   R     Question:  Specimen collection method  Answer:  Lab=Lab collect   09/10/22 1159   09/08/22 1700  CBC with Differential/Platelet  5A & 5P,   R,   Status:  Canceled     Question:  Specimen collection method  Answer:  Lab=Lab collect   09/08/22 1011              Home Health: None Equipment/Devices: None  Discharge Condition: Stable CODE STATUS: Full code Diet recommendation: Cardiac  Subjective: Seen and examined.  No complaints.  Discussed plan of discharge with him and he is in agreement.  Brief/Interim Summary: Gary [name]  is a 67 y.o. 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  No reports of abdominal pain, fever, chills or hematemesis.  He is largely bedbound but can walk a few steps with a walker.  He was admitted to hospitalist service and GI, IR as well as PCCM were consulted.  Details below.   Bright red blood per rectum/anemia of chronic disease: Patient's baseline hemoglobin appears to be around 10, presented with 9.1 currently 8.0. CTA negative for active bleed. CT abdomen however shows acute on chronic diverticulitis, some concern of malignancy.  Started on Ceftin and Flagyl. Underwent EGD 09/09/2022 which showed reflux esophagitis with no bleeding site.  Underwent colonoscopy by Dr. Meridee [name] 09/03/2022 which shows hemorrhoids, possibility of chronic colitis as well as acute diverticulitis.  No active bleeding site.  Biopsy taken, pathology pending.  Tolerating regular diet.  GI recommended total of 10 days of antibiotics.  Will discharge on Augmentin for 7 days. GI also recommended resuming anticoagulation 3 days after colonoscopy so he will resume on 09/14/2022. For possible scad versus chronic colitis will schedule 2.4 lialda twice  daily per GI recommendations.   Hypokalemia: Resolved.   ?  Abdominal aortic aneurysm vs microdissection: Seen by with neurosurgery/Dr. Edilia [name], outpatient follow-up will be arranged by them.   Morbid obesity with history of OSA as well as chronic hypoxic and hypercapnic respiratory failure, chronically on vent: Apparently, he has not been connected to the vent or required any vent since he has been discharged to [facility] few weeks ago.  PCCM has verified that  from [facility].  Patient did not require any event during hospitalization here.   CKD stage IIIa: At baseline.   Chronic diastolic CHF: Recent echo showed ejection fraction of 65%.  Appears euvolemic.  Continue Lasix per patient's request.   History of hypotension: Continue midodrine.   History of DVT: Eliquis on hold due to bright red blood per rectum.  Recommended to resume on 09/14/2022.   History of depression: Continue Lexapro .   Discharge plan was discussed with patient and/or family member and they verbalized understanding and agreed with it.  Discharge Diagnoses:  Principal Problem:   Lower GI bleed Active Problems:   Anticoagulated   Acute on chronic diastolic (congestive) heart failure ([name])   Chronic kidney disease, stage 3a ([name])   Chronic obstructive pulmonary disease (COPD) ([name])   Morbid obesity with BMI of 50.0-59.9, adult ([name])   GERD without esophagitis   HLD (hyperlipidemia)   Hyperlipidemia   Acute respiratory failure with hypoxia and hypercapnia ([name])   Anemia, posthemorrhagic, acute   Diverticulosis of colon with hemorrhage   Abnormal CT scan, gastrointestinal tract    Discharge Instructions   Allergies as of 09/12/2022   No Known Allergies      Medication List     STOP taking these medications    furosemide 10 MG/ML injection Commonly known as: LASIX Replaced by: furosemide 80 MG tablet       TAKE these medications    acetaminophen 325 MG tablet Commonly known as: TYLENOL Take 2 tablets (650 mg total) by mouth every 6 (six) hours as needed for mild pain (or Fever >/= 101).   amoxicillin-clavulanate 875-125 MG tablet Commonly known as: AUGMENTIN Take 1 tablet by mouth 2 (two) times daily for 7 days.   apixaban 2.5 MG Tabs tablet Commonly known as: ELIQUIS Take 1 tablet (2.5 mg total) by mouth 2 (two) times daily. Start taking on: Sep 14, 2022 What changed: These instructions start on Sep 14, 2022. If you are unsure what to do until  then, ask your doctor or other care provider.   docusate sodium 100 MG capsule Commonly known as: COLACE Take 1 capsule (100 mg total) by mouth daily.   escitalopram 5 MG tablet Commonly known as: LEXAPRO Take 1 tablet (5 mg total) by mouth daily.   ferrous sulfate 325 (65 FE) MG tablet Take 1 tablet (325 mg total) by mouth daily with breakfast. Home med   furosemide 80 MG tablet Commonly known as: Lasix Take 1 tablet (80 mg total) by mouth 2 (two) times daily. Replaces: furosemide 10 MG/ML injection   hydrOXYzine 25 MG tablet Commonly known as: ATARAX Take 1 tablet (25 mg total) by mouth 3 (three) times daily as needed for anxiety.   ipratropium-albuterol 0.5-2.5 (3) MG/3ML Soln Commonly known as: DUONEB Take 3 mLs by nebulization every 6 (six) hours as needed.   melatonin 5 MG Tabs Take 1 tablet (5 mg total) by mouth at bedtime. What changed:  when to take this reasons to take this   mesalamine 1.2 g  EC tablet Commonly known as: LIALDA Take 2 tablets (2.4 g total) by mouth 2 (two) times daily.   midodrine 10 MG tablet Commonly known as: PROAMATINE Take 1 tablet (10 mg total) by mouth 3 (three) times daily with meals.   pantoprazole 40 MG tablet Commonly known as: PROTONIX Take 1 tablet (40 mg total) by mouth 2 (two) times daily before a meal.   polyethylene glycol 17 g packet Commonly known as: MIRALAX / GLYCOLAX Take 17 g by mouth daily as needed for moderate constipation.   potassium chloride SA 20 MEQ tablet Commonly known as: KLOR-CON M Take 2 tablets (40 mEq total) by mouth 2 (two) times daily.   traZODone 50 MG tablet Commonly known as: DESYREL Take 1 tablet (50 mg total) by mouth at bedtime as needed for sleep.        Follow-up Information     [facility] Follow up.   Specialty: General Practice Contact information: [address] [place] [address] [phone]         [facility] Follow up  in 1 week(s).   Specialty: General Practice Contact information: [address] [place] [address] [phone]                No Known Allergies  Consultations: GI   Procedures/Studies: CT ANGIO GI BLEED  Result Date: 09/07/2022 CLINICAL DATA:  Hematochezia * Tracking Code: [name] * EXAM: CTA ABDOMEN AND PELVIS WITHOUT AND WITH CONTRAST TECHNIQUE: Multidetector CT imaging of the abdomen and pelvis was performed using the standard protocol during bolus administration of intravenous contrast. Multiplanar reconstructed images and MIPs were obtained and reviewed to evaluate the vascular anatomy. RADIATION DOSE REDUCTION: This exam was performed according to the departmental dose-optimization program which includes automated exposure control, adjustment of the mA and/or kV according to patient size and/or use of iterative reconstruction technique. CONTRAST:  OMNIPAQUE IOHEXOL 350 MG/ML SOLN COMPARISON:  11/16/2021 FINDINGS: VASCULAR Unchanged contour and caliber of the abdominal aorta. Focal, aneurysmal dissection or chronic penetrating ulceration of the infrarenal abdominal aorta measuring up to 3.1 x 2.3 cm in caliber (series 10, image 131). Standard branching pattern of the abdominal aorta, with solitary bilateral renal arteries. Mild mixed calcific atherosclerosis. Review of the MIP images confirms the above findings. NON-VASCULAR Lower chest: Scarring or atelectasis of the bilateral lung bases. Coronary artery calcifications. Hepatobiliary: No solid liver abnormality is seen. Gallstones contracted in the gallbladder. No gallbladder wall thickening, or biliary dilatation. Pancreas: Unremarkable. No pancreatic ductal dilatation or surrounding inflammatory changes. Spleen: Normal in size without significant abnormality. Adrenals/Urinary Tract: Unchanged, definitively benign small right adrenal adenomata, for which no further follow-up or characterization is required. Punctuate  nonobstructive calculus of the inferior pole of the left kidney (series 8, image 69). No right-sided calculi, ureteral calculi, or hydronephrosis. Bladder is unremarkable. Stomach/Bowel: Stomach is within normal limits. Status post right hemicolectomy and reanastomosis. Descending and sigmoid diverticula. Unchanged, markedly thickened and tethered appearing loop of distal descending colon and proximal sigmoid colon in the left lower quadrant (series 10, image 162). Unchanged enlarged lymph node adjacent to the left aspect of the proximal sigmoid colon measuring 1.1 x 0.8 cm (series 17, image 23). Lymphatic: No other enlarged abdominal or pelvic lymph nodes. Reproductive: No mass or other significant abnormality. Other: Fat containing umbilical hernia (series 10, image 195) no ascites. Musculoskeletal: No acute or significant osseous findings. IMPRESSION: 1. Descending and sigmoid diverticula. Unchanged, markedly thickened and tethered appearing  loop of distal descending colon and proximal sigmoid colon in the left lower quadrant. Unchanged enlarged lymph node adjacent to the left aspect of the proximal sigmoid colon. Findings are most suggestive of acute on chronic diverticulitis, however very focal and thickened appearance remains worrisome for underlying malignancy. Consider endoscopy if not already performed based on findings of prior examination. 2. Status post right hemicolectomy and reanastomosis. 3. Unchanged contour and caliber of the abdominal aorta. Focal, aneurysmal dissection or chronic penetrating ulceration of the infrarenal abdominal aorta measuring up to 3.1 x 2.3 cm in caliber. Recommend referral to a vascular specialist if not already obtained. This recommendation follows ACR consensus guidelines: White Paper of the ACR Incidental Findings Committee II on Vascular Findings. J Am Coll Radiol 2013; 16:109-604. 4. Coronary artery disease. 5. Cholelithiasis. 6. Nonobstructive left nephrolithiasis.  Aortic Atherosclerosis (ICD10-I70.0). Electronically Signed   By: Jearld Lesch M.D.   On: 09/07/2022 19:11   ECHOCARDIOGRAM COMPLETE  Result Date: 08/13/2022    ECHOCARDIOGRAM REPORT   Patient Name:   ABDEL DECAPRIO Date of Exam: 08/13/2022 Medical Rec #:  [id number]      Height:       75.0 in Accession #:    [id number]     Weight:       415.0 lb Date of Birth:  1955-06-17      BSA:          2.993 m Patient Age:    67 years       BP:           126/80 mmHg Patient Gender: M              HR:           88 bpm. Exam Location:  Inpatient Procedure: 2D Echo, Cardiac Doppler and Color Doppler Indications:    CHF I50.9  History:        Patient has prior history of Echocardiogram examinations, most                 recent 04/12/2022. CHF; Risk Factors:Dyslipidemia, Hypertension                 and Former Smoker.  Sonographer:    Dondra Prader RVT RCS Referring Phys: [phone] AMY D CLEGG  Sonographer Comments: No subcostal window, patient is obese, Technically challenging study due to limited acoustic windows, Technically difficult study due to poor echo windows, suboptimal parasternal window and suboptimal apical window. Image acquisition challenging due to patient body habitus and Unable to accurately measure atrium due to very poor and limited windows. Declined definity IMPRESSIONS  1. Left ventricular ejection fraction, by estimation, is 65 to 70%. The left ventricle has normal function. The left ventricle has no regional wall motion abnormalities. There is mild left ventricular hypertrophy. Left ventricular diastolic parameters were normal.  2. Right ventricular systolic function is normal. The right ventricular size is normal.  3. The mitral valve is normal in structure. No evidence of mitral valve regurgitation. No evidence of mitral stenosis.  4. The aortic valve has an indeterminant number of cusps. Aortic valve regurgitation is not visualized. No aortic stenosis is present. FINDINGS  Left Ventricle: Left ventricular  ejection fraction, by estimation, is 65 to 70%. The left ventricle has normal function. The left ventricle has no regional wall motion abnormalities. The left ventricular internal cavity size was normal in size. There is  mild left ventricular hypertrophy. Left ventricular diastolic parameters were normal. Right Ventricle: The right ventricular  size is normal. Right ventricular systolic function is normal. Left Atrium: Left atrial size was normal in size. Right Atrium: Right atrial size was normal in size. Pericardium: There is no evidence of pericardial effusion. Mitral Valve: The mitral valve is normal in structure. No evidence of mitral valve regurgitation. No evidence of mitral valve stenosis. Tricuspid Valve: The tricuspid valve is normal in structure. Tricuspid valve regurgitation is not demonstrated. No evidence of tricuspid stenosis. Aortic Valve: The aortic valve has an indeterminant number of cusps. Aortic valve regurgitation is not visualized. No aortic stenosis is present. Aortic valve mean gradient measures 7.0 mmHg. Aortic valve peak gradient measures 13.2 mmHg. Aortic valve area, by VTI measures 4.42 cm. Pulmonic Valve: The pulmonic valve was not well visualized. Pulmonic valve regurgitation is not visualized. No evidence of pulmonic stenosis. Aorta: The aortic root is normal in size and structure. Venous: The inferior vena cava was not well visualized. IAS/Shunts: The interatrial septum was not well visualized.  LEFT VENTRICLE PLAX 2D LVIDd:         5.60 cm   Diastology LVIDs:         3.00 cm   LV e' medial:    8.55 cm/s LV PW:         1.20 cm   LV E/e' medial:  9.3 LV IVS:        1.50 cm   LV e' lateral:   17.60 cm/s LVOT diam:     2.60 cm   LV E/e' lateral: 4.5 LV SV:         121 LV SV Index:   40 LVOT Area:     5.31 cm  RIGHT VENTRICLE RV S prime:     21.30 cm/s TAPSE (M-mode): 3.1 cm LEFT ATRIUM         Index LA diam:    3.80 cm 1.27 cm/m  AORTIC VALVE                     PULMONIC VALVE AV  Area (Vmax):    3.82 cm      PV Vmax:       0.62 m/s AV Area (Vmean):   3.69 cm      PV Peak grad:  1.5 mmHg AV Area (VTI):     4.42 cm AV Vmax:           182.00 cm/s AV Vmean:          122.000 cm/s AV VTI:            0.274 m AV Peak Grad:      13.2 mmHg AV Mean Grad:      7.0 mmHg LVOT Vmax:         131.00 cm/s LVOT Vmean:        84.700 cm/s LVOT VTI:          0.228 m LVOT/AV VTI ratio: 0.83  AORTA Ao Root diam: 3.40 cm Ao Asc diam:  3.30 cm MITRAL VALVE MV Area (PHT): 4.16 cm    SHUNTS MV Decel Time: 183 msec    Systemic VTI:  0.23 m MV E velocity: 79.50 cm/s  Systemic Diam: 2.60 cm MV A velocity: 88.65 cm/s MV E/A ratio:  0.90 Olga Millers MD Electronically signed by Olga Millers MD Signature Date/Time: 08/13/2022/5:39:41 PM    Final      Discharge Exam: Vitals:   09/12/22 1119 09/12/22 1126  BP: 103/66   Pulse: 80 80  Resp: 14  Temp: 98.1 F (36.7 C)   SpO2: 90%    Vitals:   09/12/22 0754 09/12/22 0929 09/12/22 1119 09/12/22 1126  BP:  114/67 103/66   Pulse: 82  80 80  Resp: 18 20 14    Temp:   98.1 F (36.7 C)   TempSrc:   Oral   SpO2:   90%   Weight:      Height:        General: Pt is alert, awake, not in acute distress Cardiovascular: RRR, S1/S2 +, no rubs, no gallops Respiratory: CTA bilaterally, no wheezing, no rhonchi Abdominal: Soft, NT, ND, bowel sounds + Extremities: no edema, no cyanosis    The results of significant diagnostics from this hospitalization (including imaging, microbiology, ancillary and laboratory) are listed below for reference.     Microbiology: Recent Results (from the past 240 hour(s))  MRSA Next Gen by PCR, Nasal     Status: None   Collection Time: 09/07/22  9:14 PM   Specimen: Nasal Mucosa; Nasal Swab  Result Value Ref Range Status   MRSA by PCR Next Gen NOT DETECTED NOT DETECTED Final    Comment: (NOTE) The GeneXpert MRSA Assay (FDA approved for NASAL specimens only), is one component of a comprehensive MRSA colonization  surveillance program. It is not intended to diagnose MRSA infection nor to guide or monitor treatment for MRSA infections. Test performance is not FDA approved in patients less than 105 years old. Performed at [facility], [address], [place], [place] [address]      Labs: BNP (last 3 results) Recent Labs    04/11/22 0612 07/10/22 0810 08/12/22 2030  BNP 60.4 17.5 27.7   Basic Metabolic Panel: Recent Labs  Lab 09/07/22 1548 09/08/22 0336 09/10/22 0301 09/12/22 0727  NA 139 137 141 138  K 4.4 4.3 3.3* 3.2*  CL 102 101 102 100  CO2 30 28 30 31   GLUCOSE 120* 111* 103* 100*  BUN 26* 23 16 8   CREATININE 1.24 1.27* 1.47* 1.27*  CALCIUM 7.9* 8.4* 7.7* 8.0*  MG  --  2.1  --   --    Liver Function Tests: Recent Labs  Lab 09/07/22 1548  AST 12*  ALT 10  ALKPHOS 93  BILITOT 0.2*  PROT 6.8  ALBUMIN 2.5*   No results for input(s): "LIPASE", "AMYLASE" in the last 168 hours. No results for input(s): "AMMONIA" in the last 168 hours. CBC: Recent Labs  Lab 09/07/22 1548 09/07/22 1954 09/08/22 0822 09/08/22 1733 09/10/22 0301 09/11/22 0035 09/12/22 0727  WBC 8.7   < > 5.4 7.3 6.6 6.5 5.6  NEUTROABS 6.2  --   --  4.8 4.1 4.3 3.6  HGB 8.8*   < > 9.3* 8.9* 8.5* 8.0* 8.0*  HCT 34.3*   < > 35.2* 31.5* 31.6* 30.2* 30.8*  MCV 80.5   < > 78.9* 76.6* 80.8 78.9* 80.4  PLT 182   < > 164 168 205 196 201   < > = values in this interval not displayed.   Cardiac Enzymes: No results for input(s): "CKTOTAL", "CKMB", "CKMBINDEX", "TROPONINI" in the last 168 hours. BNP: Invalid input(s): "POCBNP" CBG: No results for input(s): "GLUCAP" in the last 168 hours. D-Dimer No results for input(s): "DDIMER" in the last 72 hours. Hgb A1c No results for input(s): "HGBA1C" in the last 72 hours. Lipid Profile No results for input(s): "CHOL", "HDL", "LDLCALC", "TRIG", "CHOLHDL", "LDLDIRECT" in the last 72 hours. Thyroid function studies No results for input(s): "TSH", "  T4TOTAL",  "T3FREE", "THYROIDAB" in the last 72 hours.  Invalid input(s): "FREET3" Anemia work up No results for input(s): "VITAMINB12", "FOLATE", "FERRITIN", "TIBC", "IRON", "RETICCTPCT" in the last 72 hours. Urinalysis    Component Value Date/Time   COLORURINE YELLOW (A) 04/11/2022 0912   APPEARANCEUR HAZY (A) 04/11/2022 0912   APPEARANCEUR Clear 04/15/2014 1240   LABSPEC 1.019 04/11/2022 0912   LABSPEC 1.013 04/15/2014 1240   PHURINE 5.0 04/11/2022 0912   GLUCOSEU NEGATIVE 04/11/2022 0912   GLUCOSEU Negative 04/15/2014 1240   HGBUR NEGATIVE 04/11/2022 0912   BILIRUBINUR NEGATIVE 04/11/2022 0912   BILIRUBINUR Negative 04/15/2014 1240   KETONESUR NEGATIVE 04/11/2022 0912   PROTEINUR NEGATIVE 04/11/2022 0912   NITRITE NEGATIVE 04/11/2022 0912   LEUKOCYTESUR NEGATIVE 04/11/2022 0912   LEUKOCYTESUR Negative 04/15/2014 1240   Sepsis Labs Recent Labs  Lab 09/08/22 1733 09/10/22 0301 09/11/22 0035 09/12/22 0727  WBC 7.3 6.6 6.5 5.6   Microbiology Recent Results (from the past 240 hour(s))  MRSA Next Gen by PCR, Nasal     Status: None   Collection Time: 09/07/22  9:14 PM   Specimen: Nasal Mucosa; Nasal Swab  Result Value Ref Range Status   MRSA by PCR Next Gen NOT DETECTED NOT DETECTED Final    Comment: (NOTE) The GeneXpert MRSA Assay (FDA approved for NASAL specimens only), is one component of a comprehensive MRSA colonization surveillance program. It is not intended to diagnose MRSA infection nor to guide or monitor treatment for MRSA infections. Test performance is not FDA approved in patients less than 35 years old. Performed at [facility], [address] [place], [place] [address]      Time coordinating discharge: Over 30 minutes  SIGNED:   Hughie Closs, MD  [facility] 09/12/2022, 2:04 PM *Please note that this is a verbal dictation therefore any spelling or grammatical errors are due to the "Dragon Medical One" system interpretation. If 7PM-7AM,  please contact night-coverage [email or web address]

## 2022-09-12 NOTE — Progress Notes (Signed)
Report called to Ron, RN [facility].

## 2022-09-12 NOTE — TOC Transition Note (Addendum)
Transition of Care [facility]) - CM/SW Discharge Note   Patient Details  Name: Gary [name] MRN: [id number] Date of Birth: Jan 30, 1956  [facility] CM/SW Contact:  [name], RN Phone Number: 09/12/2022, 10:45 AM   Clinical Narrative:    Patient is for dc to [facility] today via [facility], Staff RN will need to call report to [phone] if no answer then call [phone] for report.  Address for [facility] is [address] [facility] [place], [place].  PTAR scheduled , eta is around 1 pm.  Respiratory will come up to change his trach collar to oxgen to travel with PTAR.  NCM notified patient of dc today to [facility]. Patient is to receive iron prior to dc as well per Staff RN. Patient states he has been doing tele health with [facility] in [place], also his medications are delivered to him at home.  He states he used to also go to [facility] in [place], but he has not been driving.       Barriers to Discharge: Continued Medical Work up   Patient Goals and CMS Choice      Discharge Placement                         Discharge Plan and Services Additional resources added to the After Visit Summary for                                       Social Determinants of Health (SDOH) Interventions SDOH Screenings   Food Insecurity: No Food Insecurity (09/07/2022)  Housing: Low Risk  (09/07/2022)  Transportation Needs: Unmet Transportation Needs (08/13/2022)  Utilities: Not At Risk (08/13/2022)  Depression (PHQ2-9): Low Risk  (06/07/2021)  Financial Resource Strain: Medium Risk (05/03/2017)  Physical Activity: Insufficiently Active (05/03/2017)  Social Connections: Moderately Integrated (05/03/2017)  Stress: No Stress Concern Present (05/03/2017)  Tobacco Use: Medium Risk (09/11/2022)     Readmission Risk Interventions    12/31/2021    8:58 AM 12/20/2021    4:21 PM 05/04/2021    1:26 PM  Readmission Risk Prevention Plan   Transportation Screening Complete Complete Complete  Medication Review Oceanographer) Complete Complete Complete  PCP or Specialist appointment within 3-5 days of discharge Complete Complete Complete  HRI or Home Care Consult Complete Complete Complete  SW Recovery Care/Counseling Consult Complete Complete Complete  Palliative Care Screening Complete Complete Not Applicable  Skilled Nursing Facility Not Applicable Not Applicable Not Applicable

## 2022-09-12 NOTE — Progress Notes (Signed)
Progress Note  Primary GI:  [facility]-unassigned here    Subjective  Chief Complaint: GI bleed  No family was present at the time of my evaluation. Patient denies nausea, vomiting. Patient states he had a bowel movement this morning, denies hematochezia. While there patient did have some desaturations, oxygen was increased, patient states he felt better.  Also denies shortness of breath or chest pain.  09/11/2022 Colon  - Preparation of the colon was fair even after extensive lavage. - Hemorrhoids found on digital rectal exam. - Functional end- to- end ileo- colonic anastomosis, characterized by healthy appearing mucosa. - Diverticulosis in the recto- sigmoid colon, in the sigmoid colon and in the descending colon. - Diverticulosis in the sigmoid colon. Biopsied. Clip ( MR conditional) was placed. Clip manufacturer: [employer]. - What appears to be colitis changes are noted in the recto- sigmoid colon to the splenic flexure. This was moderate in severity and severe. Biopsied to rule out chronicity versus SCAD. - Non- bleeding non- thrombosed external and internal hemorrhoids.   Objective   Vital signs in last 24 hours: Temp:  [98 F (36.7 C)-98.3 F (36.8 C)] 98.1 F (36.7 C) (05/29 1119) Pulse Rate:  [75-82] 80 (05/29 1126) Resp:  [14-28] 14 (05/29 1119) BP: (91-114)/(62-67) 103/66 (05/29 1119) SpO2:  [90 %-94 %] 90 % (05/29 1119) FiO2 (%):  [28 %-40 %] 40 % (05/29 1126) Weight:  [180.7 kg] 180.7 kg (05/29 0519) Last BM Date : 09/11/22 Last BM recorded by nurses in past 5 days Stool Type: Type 6 (Mushy consistency with ragged edges) (09/12/2022  8:45 AM)  General:   Obese AA male appears to be in NAD Lungs: Patient with tracheal collar, respirations even and unlabored. Lungs clear to auscultation bilaterally.   No wheezes, crackles, or rhonchi.  Heart: No MRG. Regular rate and rhythm. Abdomen:  Soft, nondistended, nontender. No rebound or guarding. Normal bowel sounds.  No appreciable masses or hepatomegaly. Msk:  Symmetrical without gross deformities. Peripheral pulses intact.  Extremities:  Without edema, no deformity or joint abnormality.  Neurologic:  Alert and  oriented x4;  grossly normal neurologically.   Skin:   Dry and intact without significant lesions or rashes. Psychiatric: Demonstrates good judgement and reason without abnormal affect or behaviors.  Intake/Output from previous day: 05/28 0701 - 05/29 0700 In: 1630 [P.O.:730; I.V.:500; IV Piggyback:400] Out: 1901 [Urine:1900; Blood:1] Intake/Output this shift: Total I/O In: 360 [P.O.:360] Out: 700 [Urine:700]  Studies/Results: No results found.  Lab Results: Recent Labs    09/10/22 0301 09/11/22 0035 09/12/22 0727  WBC 6.6 6.5 5.6  HGB 8.5* 8.0* 8.0*  HCT 31.6* 30.2* 30.8*  PLT 205 196 201   BMET Recent Labs    09/10/22 0301 09/12/22 0727  NA 141 138  K 3.3* 3.2*  CL 102 100  CO2 30 31  GLUCOSE 103* 100*  BUN 16 8  CREATININE 1.47* 1.27*  CALCIUM 7.7* 8.0*   LFT No results for input(s): "PROT", "ALBUMIN", "AST", "ALT", "ALKPHOS", "BILITOT", "BILIDIR", "IBILI" in the last 72 hours. PT/INR No results for input(s): "LABPROT", "INR" in the last 72 hours.   Scheduled Meds:  Chlorhexidine Gluconate Cloth  6 each Topical Daily   docusate sodium  100 mg Oral Daily   escitalopram  5 mg Oral Daily   furosemide  40 mg Intravenous BID   midodrine  10 mg Oral TID WC   pantoprazole (PROTONIX) IV  40 mg Intravenous Q12H   peg 3350 powder  0.5 kit Oral Once   potassium chloride  40 mEq Oral Q4H   Continuous Infusions:  cefTRIAXone (ROCEPHIN)  IV 2 g (09/12/22 0929)   ferric gluconate (FERRLECIT) IVPB 250 mg (09/12/22 1146)   metronidazole 500 mg (09/12/22 1031)      Impression/Plan:   Hematochezia with IDA secondary to chronic blood loss Colonoscopy with Dr. Meridee Score yesterday showed healthy anastomosis, diverticulosis sigmoid clip biopsy clip placed, colitis  rectosigmoid colon to the splenic flexure moderate in severity. Uncertain if this is diverticulitis, scad, colitis Pending pathology can consider 5-ASA for colitis or scad -Pathology shown inflamed granulation tissue consistent with ulcer negative for dysplasia, mildly active chronic colitis.  Differential is diverticulitis related colitis and inflammatory bowel disease. -Complete 10 days of antibiotics for potential diverticulitis -For possible scad versus chronic colitis will schedule 2.4 lialda twice daily.  -Appears patient has seen [facility] for hospitalization as well as our team at [facility] during hospitalization, I do not see outpatient follow-up. -Patient needs to confirm which GI group is closer, and schedule outpatient follow-up 4 weeks.  History of DVT Was on Eliquis, last documented dose 5/23 Restart anticoagulation 72 hours per Dr. Meridee Score notes   Principal Problem:   Lower GI bleed Active Problems:   Morbid obesity with BMI of 50.0-59.9, adult ([name])   Anticoagulated   Hyperlipidemia   Acute respiratory failure with hypoxia and hypercapnia ([name])   Chronic kidney disease, stage 3a ([name])   HLD (hyperlipidemia)   Acute on chronic diastolic (congestive) heart failure ([name])   Chronic obstructive pulmonary disease (COPD) ([name])   GERD without esophagitis   Anemia, posthemorrhagic, acute   Diverticulosis of colon with hemorrhage   Abnormal CT scan, gastrointestinal tract    LOS: 5 days   Gary [name]  09/12/2022, 12:50 PM

## 2022-09-12 NOTE — Anesthesia Postprocedure Evaluation (Signed)
Anesthesia Post Note  Patient: Gary [name]  Procedure(s) Performed: BIOPSY HEMOSTASIS CLIP PLACEMENT COLONOSCOPY     Patient location during evaluation: PACU Anesthesia Type: MAC Level of consciousness: awake and alert Pain management: pain level controlled Vital Signs Assessment: post-procedure vital signs reviewed and stable Respiratory status: spontaneous breathing, nonlabored ventilation, respiratory function stable and patient connected to tracheostomy mask oxygen Cardiovascular status: stable and blood pressure returned to baseline Postop Assessment: no apparent nausea or vomiting Anesthetic complications: no   No notable events documented.  Last Vitals:  Vitals:   09/12/22 1119 09/12/22 1126  BP: 103/66   Pulse: 80 80  Resp: 14   Temp: 36.7 C   SpO2: 90%     Last Pain:  Vitals:   09/12/22 1200  TempSrc:   PainSc: 0-No pain                 [name]

## 2022-09-12 NOTE — Progress Notes (Signed)
Pt refused lab draws twice this shift. Stated he didn't want any labs drawn until after 7am.

## 2022-09-12 NOTE — Care Management Important Message (Signed)
Important Message  Patient Details  Name: Gary [name] MRN: [id number] Date of Birth: 01/01/1956   Medicare Important Message Given:  Yes     [name] 09/12/2022, 10:48 AM

## 2022-09-14 ENCOUNTER — Telehealth: Payer: Self-pay | Admitting: Vascular Surgery

## 2022-09-14 NOTE — Telephone Encounter (Signed)
Call patient to schedule 3 month f/u. Patient declined appointment. Does not want to schedule.

## 2022-09-14 NOTE — Telephone Encounter (Signed)
-----   Message from Chuck Hint, MD sent at 09/08/2022  4:15 PM EDT ----- Regarding: charge and f/u Level 3 consult. 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  Thank you. CD

## 2022-09-16 ENCOUNTER — Encounter: Payer: Self-pay | Admitting: Gastroenterology

## 2022-09-16 ENCOUNTER — Encounter (HOSPITAL_COMMUNITY): Payer: Self-pay | Admitting: Gastroenterology

## 2022-09-17 ENCOUNTER — Encounter: Payer: Self-pay | Admitting: Gastroenterology

## 2022-09-26 ENCOUNTER — Emergency Department: Payer: Medicare Other

## 2022-09-26 ENCOUNTER — Inpatient Hospital Stay
Admission: EM | Admit: 2022-09-26 | Discharge: 2022-10-08 | DRG: 208 | Disposition: A | Discharge status: Home Health | Payer: Medicare Other | Attending: Internal Medicine | Admitting: Internal Medicine

## 2022-09-26 ENCOUNTER — Other Ambulatory Visit: Payer: Self-pay

## 2022-09-26 ENCOUNTER — Inpatient Hospital Stay: Payer: Medicare Other

## 2022-09-26 DIAGNOSIS — D631 Anemia in chronic kidney disease: Secondary | ICD-10-CM | POA: Diagnosis present

## 2022-09-26 DIAGNOSIS — F32A Depression, unspecified: Secondary | ICD-10-CM | POA: Diagnosis present

## 2022-09-26 DIAGNOSIS — N1831 Chronic kidney disease, stage 3a: Secondary | ICD-10-CM | POA: Diagnosis present

## 2022-09-26 DIAGNOSIS — Z9911 Dependence on respirator [ventilator] status: Secondary | ICD-10-CM

## 2022-09-26 DIAGNOSIS — J9601 Acute respiratory failure with hypoxia: Secondary | ICD-10-CM | POA: Insufficient documentation

## 2022-09-26 DIAGNOSIS — Z5320 Procedure and treatment not carried out because of patient's decision for unspecified reasons: Secondary | ICD-10-CM | POA: Diagnosis not present

## 2022-09-26 DIAGNOSIS — J9809 Other diseases of bronchus, not elsewhere classified: Secondary | ICD-10-CM | POA: Diagnosis present

## 2022-09-26 DIAGNOSIS — Z1152 Encounter for screening for COVID-19: Secondary | ICD-10-CM

## 2022-09-26 DIAGNOSIS — E662 Morbid (severe) obesity with alveolar hypoventilation: Secondary | ICD-10-CM | POA: Diagnosis present

## 2022-09-26 DIAGNOSIS — Z6841 Body Mass Index (BMI) 40.0 and over, adult: Secondary | ICD-10-CM | POA: Diagnosis not present

## 2022-09-26 DIAGNOSIS — Z8616 Personal history of COVID-19: Secondary | ICD-10-CM | POA: Diagnosis not present

## 2022-09-26 DIAGNOSIS — I130 Hypertensive heart and chronic kidney disease with heart failure and stage 1 through stage 4 chronic kidney disease, or unspecified chronic kidney disease: Secondary | ICD-10-CM | POA: Diagnosis present

## 2022-09-26 DIAGNOSIS — L89322 Pressure ulcer of left buttock, stage 2: Secondary | ICD-10-CM | POA: Diagnosis present

## 2022-09-26 DIAGNOSIS — T17990A Other foreign object in respiratory tract, part unspecified in causing asphyxiation, initial encounter: Secondary | ICD-10-CM | POA: Diagnosis not present

## 2022-09-26 DIAGNOSIS — J9600 Acute respiratory failure, unspecified whether with hypoxia or hypercapnia: Secondary | ICD-10-CM | POA: Diagnosis not present

## 2022-09-26 DIAGNOSIS — J9621 Acute and chronic respiratory failure with hypoxia: Secondary | ICD-10-CM

## 2022-09-26 DIAGNOSIS — I82503 Chronic embolism and thrombosis of unspecified deep veins of lower extremity, bilateral: Secondary | ICD-10-CM | POA: Diagnosis present

## 2022-09-26 DIAGNOSIS — J440 Chronic obstructive pulmonary disease with acute lower respiratory infection: Secondary | ICD-10-CM | POA: Diagnosis present

## 2022-09-26 DIAGNOSIS — Z7901 Long term (current) use of anticoagulants: Secondary | ICD-10-CM

## 2022-09-26 DIAGNOSIS — G4730 Sleep apnea, unspecified: Secondary | ICD-10-CM | POA: Diagnosis present

## 2022-09-26 DIAGNOSIS — Z8711 Personal history of peptic ulcer disease: Secondary | ICD-10-CM

## 2022-09-26 DIAGNOSIS — J9622 Acute and chronic respiratory failure with hypercapnia: Secondary | ICD-10-CM | POA: Diagnosis present

## 2022-09-26 DIAGNOSIS — N179 Acute kidney failure, unspecified: Secondary | ICD-10-CM | POA: Diagnosis present

## 2022-09-26 DIAGNOSIS — D638 Anemia in other chronic diseases classified elsewhere: Secondary | ICD-10-CM | POA: Diagnosis present

## 2022-09-26 DIAGNOSIS — Z930 Tracheostomy status: Secondary | ICD-10-CM | POA: Diagnosis not present

## 2022-09-26 DIAGNOSIS — D509 Iron deficiency anemia, unspecified: Secondary | ICD-10-CM | POA: Diagnosis present

## 2022-09-26 DIAGNOSIS — R682 Dry mouth, unspecified: Secondary | ICD-10-CM | POA: Diagnosis present

## 2022-09-26 DIAGNOSIS — R471 Dysarthria and anarthria: Secondary | ICD-10-CM | POA: Diagnosis present

## 2022-09-26 DIAGNOSIS — R072 Precordial pain: Secondary | ICD-10-CM | POA: Diagnosis present

## 2022-09-26 DIAGNOSIS — L8990 Pressure ulcer of unspecified site, unspecified stage: Secondary | ICD-10-CM | POA: Diagnosis present

## 2022-09-26 DIAGNOSIS — R262 Difficulty in walking, not elsewhere classified: Secondary | ICD-10-CM | POA: Diagnosis present

## 2022-09-26 DIAGNOSIS — J441 Chronic obstructive pulmonary disease with (acute) exacerbation: Secondary | ICD-10-CM | POA: Diagnosis present

## 2022-09-26 DIAGNOSIS — Z87891 Personal history of nicotine dependence: Secondary | ICD-10-CM

## 2022-09-26 DIAGNOSIS — B351 Tinea unguium: Secondary | ICD-10-CM | POA: Diagnosis present

## 2022-09-26 DIAGNOSIS — L853 Xerosis cutis: Secondary | ICD-10-CM | POA: Diagnosis present

## 2022-09-26 DIAGNOSIS — Z79899 Other long term (current) drug therapy: Secondary | ICD-10-CM

## 2022-09-26 DIAGNOSIS — G9341 Metabolic encephalopathy: Secondary | ICD-10-CM | POA: Diagnosis present

## 2022-09-26 DIAGNOSIS — I5033 Acute on chronic diastolic (congestive) heart failure: Secondary | ICD-10-CM | POA: Diagnosis present

## 2022-09-26 DIAGNOSIS — J151 Pneumonia due to Pseudomonas: Secondary | ICD-10-CM | POA: Diagnosis present

## 2022-09-26 DIAGNOSIS — E785 Hyperlipidemia, unspecified: Secondary | ICD-10-CM | POA: Diagnosis present

## 2022-09-26 DIAGNOSIS — I428 Other cardiomyopathies: Secondary | ICD-10-CM | POA: Diagnosis present

## 2022-09-26 DIAGNOSIS — G4733 Obstructive sleep apnea (adult) (pediatric): Secondary | ICD-10-CM | POA: Diagnosis present

## 2022-09-26 DIAGNOSIS — Z2239 Carrier of other specified bacterial diseases: Secondary | ICD-10-CM

## 2022-09-26 LAB — BLOOD GAS, ARTERIAL
Acid-Base Excess: 18.7 mmol/L — ABNORMAL HIGH (ref 0.0–2.0)
Bicarbonate: 51.4 mmol/L — ABNORMAL HIGH (ref 20.0–28.0)
FIO2: 100 %
O2 Saturation: 94.2 %
Patient temperature: 37
pCO2 arterial: 112 mmHg (ref 32–48)
pH, Arterial: 7.27 — ABNORMAL LOW (ref 7.35–7.45)
pO2, Arterial: 70 mmHg — ABNORMAL LOW (ref 83–108)

## 2022-09-26 LAB — CBC WITH DIFFERENTIAL/PLATELET
Abs Immature Granulocytes: 0.06 10*3/uL (ref 0.00–0.07)
Basophils Absolute: 0 10*3/uL (ref 0.0–0.1)
Basophils Relative: 0 %
Eosinophils Absolute: 0 10*3/uL (ref 0.0–0.5)
Eosinophils Relative: 0 %
HCT: 37.7 % — ABNORMAL LOW (ref 39.0–52.0)
Hemoglobin: 8.9 g/dL — ABNORMAL LOW (ref 13.0–17.0)
Immature Granulocytes: 1 %
Lymphocytes Relative: 11 %
Lymphs Abs: 0.9 10*3/uL (ref 0.7–4.0)
MCH: 21.1 pg — ABNORMAL LOW (ref 26.0–34.0)
MCHC: 23.6 g/dL — ABNORMAL LOW (ref 30.0–36.0)
MCV: 89.3 fL (ref 80.0–100.0)
Monocytes Absolute: 0.8 10*3/uL (ref 0.1–1.0)
Monocytes Relative: 10 %
Neutro Abs: 6.5 10*3/uL (ref 1.7–7.7)
Neutrophils Relative %: 78 %
Platelets: 180 10*3/uL (ref 150–400)
RBC: 4.22 MIL/uL (ref 4.22–5.81)
RDW: 18.4 % — ABNORMAL HIGH (ref 11.5–15.5)
WBC: 8.3 10*3/uL (ref 4.0–10.5)
nRBC: 1.6 % — ABNORMAL HIGH (ref 0.0–0.2)

## 2022-09-26 LAB — BRAIN NATRIURETIC PEPTIDE: B Natriuretic Peptide: 168.3 pg/mL — ABNORMAL HIGH (ref 0.0–100.0)

## 2022-09-26 LAB — COMPREHENSIVE METABOLIC PANEL
ALT: 9 U/L (ref 0–44)
AST: 13 U/L — ABNORMAL LOW (ref 15–41)
Albumin: 3.2 g/dL — ABNORMAL LOW (ref 3.5–5.0)
Alkaline Phosphatase: 88 U/L (ref 38–126)
Anion gap: 8 (ref 5–15)
BUN: 20 mg/dL (ref 8–23)
CO2: 44 mmol/L — ABNORMAL HIGH (ref 22–32)
Calcium: 8.3 mg/dL — ABNORMAL LOW (ref 8.9–10.3)
Chloride: 89 mmol/L — ABNORMAL LOW (ref 98–111)
Creatinine, Ser: 1.25 mg/dL — ABNORMAL HIGH (ref 0.61–1.24)
GFR, Estimated: >60 mL/min (ref >60)
Glucose, Bld: 96 mg/dL (ref 70–99)
Potassium: 3.6 mmol/L (ref 3.5–5.1)
Sodium: 141 mmol/L (ref 135–145)
Total Bilirubin: 0.6 mg/dL (ref 0.3–1.2)
Total Protein: 8.3 g/dL — ABNORMAL HIGH (ref 6.5–8.1)

## 2022-09-26 LAB — TROPONIN I (HIGH SENSITIVITY): Troponin I (High Sensitivity): 12 ng/L (ref <18)

## 2022-09-26 MED ORDER — SENNA 8.6 MG PO TABS
1.0000 | ORAL_TABLET | Freq: Two times a day (BID) | ORAL | Status: DC
  Administered 2022-09-26 – 2022-10-07 (×3): 8.6 mg via ORAL
  Filled 2022-09-26 (×12): qty 1

## 2022-09-26 MED ORDER — POLYETHYLENE GLYCOL 3350 17 G PO PACK: 17.0000 g | PACK | Freq: Every day | ORAL | Status: DC | PRN

## 2022-09-26 MED ORDER — SODIUM CHLORIDE 0.9 % IV SOLN
1.0000 g | INTRAVENOUS | Status: DC
  Administered 2022-09-26 – 2022-09-28 (×3): 1 g via INTRAVENOUS
  Filled 2022-09-26 (×3): qty 10

## 2022-09-26 MED ORDER — ACETYLCYSTEINE 20 % IN SOLN
4.0000 mL | Freq: Three times a day (TID) | RESPIRATORY_TRACT | Status: DC
  Administered 2022-09-27: 4 mL via RESPIRATORY_TRACT
  Filled 2022-09-26 (×5): qty 4

## 2022-09-26 MED ORDER — ACETAMINOPHEN 325 MG PO TABS
650.0000 mg | ORAL_TABLET | Freq: Four times a day (QID) | ORAL | Status: DC | PRN
  Administered 2022-10-01 – 2022-10-04 (×4): 650 mg via ORAL
  Filled 2022-09-26 (×5): qty 2

## 2022-09-26 MED ORDER — SODIUM CHLORIDE 0.9 % IV SOLN
500.0000 mg | INTRAVENOUS | Status: AC
  Administered 2022-09-27 – 2022-09-28 (×3): 500 mg via INTRAVENOUS
  Filled 2022-09-26 (×3): qty 5

## 2022-09-26 MED ORDER — INSULIN ASPART 100 UNIT/ML IJ SOLN: 0.0000 [IU] | Freq: Every day | INTRAMUSCULAR | Status: DC

## 2022-09-26 MED ORDER — SODIUM CHLORIDE 0.9% FLUSH
3.0000 mL | Freq: Two times a day (BID) | INTRAVENOUS | Status: DC
  Administered 2022-09-27 – 2022-10-08 (×22): 3 mL via INTRAVENOUS

## 2022-09-26 MED ORDER — PANTOPRAZOLE SODIUM 40 MG IV SOLR
40.0000 mg | INTRAVENOUS | Status: DC
  Administered 2022-09-27 – 2022-10-05 (×9): 40 mg via INTRAVENOUS
  Filled 2022-09-26 (×9): qty 10

## 2022-09-26 MED ORDER — ACETAMINOPHEN 650 MG RE SUPP: 650.0000 mg | Freq: Four times a day (QID) | RECTAL | Status: DC | PRN

## 2022-09-26 MED ORDER — FUROSEMIDE 10 MG/ML IJ SOLN
80.0000 mg | Freq: Once | INTRAMUSCULAR | Status: AC
  Administered 2022-09-26: 80 mg via INTRAVENOUS
  Filled 2022-09-26: qty 8

## 2022-09-26 MED ORDER — INSULIN ASPART 100 UNIT/ML IJ SOLN
0.0000 [IU] | Freq: Three times a day (TID) | INTRAMUSCULAR | Status: DC
  Filled 2022-09-26: qty 1

## 2022-09-26 MED ORDER — IPRATROPIUM-ALBUTEROL 0.5-2.5 (3) MG/3ML IN SOLN
3.0000 mL | RESPIRATORY_TRACT | Status: DC
  Administered 2022-09-26 – 2022-09-29 (×15): 3 mL via RESPIRATORY_TRACT
  Filled 2022-09-26 (×14): qty 3

## 2022-09-26 MED ORDER — METHYLPREDNISOLONE SODIUM SUCC 125 MG IJ SOLR
60.0000 mg | Freq: Two times a day (BID) | INTRAMUSCULAR | Status: AC
  Administered 2022-09-27 – 2022-10-05 (×18): 60 mg via INTRAVENOUS
  Filled 2022-09-26 (×18): qty 2

## 2022-09-26 NOTE — Assessment & Plan Note (Addendum)
Hbg stable. Monitor CBC. 

## 2022-09-26 NOTE — Assessment & Plan Note (Addendum)
Patient has a worsening bicarbonate today of 44, this is new.  Therefore I suspect that the patient has had worsening hypercapnia developed over the last couple of days.  Please note that the patient has previously been requiring chronic ventilator, it seems during his last hospitalization he was taken off the ventilator.  It seems to me that the patient is not tolerating this.  In addition I do suspect that the patient has underlying pneumonia, with mucous plugging causing additional hypoxemia.  We will continue with FiO2 as needed, I will contact PCCM team to get the patient on a ventilator nocturnally.  Will treat the patient with ceftriaxone azithromycin, mucolytic agents, turning position and chest physical therapy.  ABG is pending at this time.  Patient has a history of her diastolic CHF, patient may have been slightly fluid overloaded, has received a single dose of Lasix in the ER, I will monitor intake output and daily weights.  I will defer further Lasix administration to daily physical exam.  Repeat chest x-ray is pending  ______________________  Addendum : ABG shows pCO2 of the lower 100.  I will connect the patient to the ventilator, repeat ABG in an hour, change the tracheostomy to a cuffed tube.  Contact PCCM. Add methylprednisolone for copd exacerbation rx.

## 2022-09-26 NOTE — ED Provider Notes (Signed)
[facility] Provider Note    Event Date/Time   First MD Initiated Contact with Patient 09/26/22 2003     (approximate)   History   Shortness of Breath   HPI  Gary [name] is a 67 y.o. male with a history of morbid obesity, trach collar dependent, with chronic respiratory failure due to OSA, COPD, hypertension, diastolic CHF as well as CKD and hypertension, with frequent prior hospitalizations, who presents with shortness of breath and multiple other complaints.  The patient was being discharged from a rehab facility to home when he said that he was feeling short of breath.  The patient reports that he has had increased shortness of breath over the last week associated with worsening bilateral lower extremity swelling.  He feels like he has too much fluid.  He has been getting Lasix but with no relief.  The patient states he was recently treated for pneumonia.  He states he feels weak and is coughing.  He feels like the pneumonia never completely resolved.  I reviewed past medical records.  The patient was most recently admitted to the hospital service last month for lower GI bleed and discharged on 5/29 to a rehab facility.  EGD and colonoscopy did not show any evidence of active bleeding.  Previously he was admitted earlier in May for acute on chronic CHF.  There is also a note from the [facility] emergency department [place] from today.  The patient was being transported by EMS and had been pulling at his trach and causing his O2 sat to drop.  The patient was found to have a normal O2 saturation in the ED, the trach was suctioned, and he was discharged.   Physical Exam   Triage Vital Signs: ED Triage Vitals  Enc Vitals Group     BP 09/26/22 2000 125/78     Pulse Rate 09/26/22 2000 99     Resp 09/26/22 2000 (!) 25     Temp 09/26/22 2013 98.7 F (37.1 C)     Temp src --      SpO2 09/26/22 2000 (!) 71 %     Weight 09/26/22 2004 (!) 411 lb (186.4 kg)      Height --      Head Circumference --      Peak Flow --      Pain Score --      Pain Loc --      Pain Edu? --      Excl. in GC? --     Most recent vital signs: Vitals:   09/26/22 2143 09/26/22 2255  BP:    Pulse:  93  Resp: 19   Temp:    SpO2:  94%     General: Alert, chronically ill-appearing but in no acute distress. CV:  Good peripheral perfusion.  Resp:  Increased effort.  Faint rales bilateral bases with overall diminished breath sounds. Abd:  No distention.  Other:  Trace bilateral lower extremity edema.  Intact distal pulses.   ED Results / Procedures / Treatments   Labs (all labs ordered are listed, but only abnormal results are displayed) Labs Reviewed  COMPREHENSIVE METABOLIC PANEL - Abnormal; Notable for the following components:      Result Value   Chloride 89 (*)    CO2 44 (*)    Creatinine, Ser 1.25 (*)    Calcium 8.3 (*)    Total Protein 8.3 (*)    Albumin 3.2 (*)    AST 13 (*)  All other components within normal limits  CBC WITH DIFFERENTIAL/PLATELET - Abnormal; Notable for the following components:   Hemoglobin 8.9 (*)    HCT 37.7 (*)    MCH 21.1 (*)    MCHC 23.6 (*)    RDW 18.4 (*)    nRBC 1.6 (*)    All other components within normal limits  BRAIN NATRIURETIC PEPTIDE - Abnormal; Notable for the following components:   B Natriuretic Peptide 168.3 (*)    All other components within normal limits  SARS CORONAVIRUS 2 BY RT PCR  RESPIRATORY PANEL BY PCR  CULTURE, BLOOD (ROUTINE X 2)  CULTURE, BLOOD (ROUTINE X 2)  BLOOD GAS, ARTERIAL  BASIC METABOLIC PANEL  CBC  TROPONIN I (HIGH SENSITIVITY)  TROPONIN I (HIGH SENSITIVITY)     EKG  ED ECG REPORT I, Dionne Bucy, the attending physician, personally viewed and interpreted this ECG.  Date: 09/26/2022 EKG Time: 2043 Rate: 93 Rhythm: normal sinus rhythm with PVCs QRS Axis: normal Intervals: Borderline prolonged QTc ST/T Wave abnormalities: normal Narrative Interpretation: no  evidence of acute ischemia    RADIOLOGY  Chest x-ray: I independently viewed and interpreted the images; there is cardiomegaly with some mild vascular congestion/edema   PROCEDURES:  Critical Care performed: No  Procedures   MEDICATIONS ORDERED IN ED: Medications  sodium chloride flush (NS) 0.9 % injection 3 mL (has no administration in time range)  acetaminophen (TYLENOL) tablet 650 mg (has no administration in time range)    Or  acetaminophen (TYLENOL) suppository 650 mg (has no administration in time range)  senna (SENOKOT) tablet 8.6 mg (8.6 mg Oral Given 09/26/22 2320)  polyethylene glycol (MIRALAX / GLYCOLAX) packet 17 g (has no administration in time range)  cefTRIAXone (ROCEPHIN) 1 g in sodium chloride 0.9 % 100 mL IVPB (1 g Intravenous New Bag/Given 09/26/22 2321)  azithromycin (ZITHROMAX) 500 mg in sodium chloride 0.9 % 250 mL IVPB (has no administration in time range)  acetylcysteine (MUCOMYST) 20 % nebulizer / oral solution 4 mL (has no administration in time range)  ipratropium-albuterol (DUONEB) 0.5-2.5 (3) MG/3ML nebulizer solution 3 mL (3 mLs Nebulization Given 09/26/22 2320)  furosemide (LASIX) injection 80 mg (80 mg Intravenous Given 09/26/22 2140)     IMPRESSION / MDM / ASSESSMENT AND PLAN / ED COURSE  I reviewed the triage vital signs and the nursing notes.  67 year old male with PMH as noted above presents with worsening shortness of breath, generalized weakness, and reports edema as well.  On exam, the patient is somewhat tachypneic and borderline hypoxic on his trach collar.  He does not demonstrate acute respiratory distress and is able to speak in full sentences.  He does not have significant peripheral edema.  Differential diagnosis includes, but is not limited to, CHF exacerbation, pneumonia, COPD exacerbation, acute bronchitis, viral infection.  We will obtain this x-ray, lab workup, and reassess.  Patient's presentation is most consistent with acute  presentation with potential threat to life or bodily function.  The patient is on the cardiac monitor to evaluate for evidence of arrhythmia and/or significant heart rate changes.   ----------------------------------------- 10:38 PM on 09/26/2022 -----------------------------------------  Chest x-ray shows possible mild edema.  BNP is minimally elevated.  Hemoglobin shows chronic anemia.  Electrolytes are unremarkable.  On reassessment, the patient continues to require a relatively high level of oxygen, 6-8 L via the trach collar.  He still appears somewhat tachypneic.  Their workup is overall reassuring but presentation is consistent with mild CHF exacerbation.  The patient states he feels quite unwell.  Based on the clinical findings and workup, I consulted Dr. Maryjean Ka from the hospitalist service; based our discussion he agrees to evaluate the patient for admission.   FINAL CLINICAL IMPRESSION(S) / ED DIAGNOSES   Final diagnoses:  Acute on chronic respiratory failure with hypoxia ([name])  Acute on chronic diastolic congestive heart failure ([name])     Rx / DC Orders   ED Discharge Orders     None        Note:  This document was prepared using Dragon voice recognition software and may include unintentional dictation errors.    Dionne Bucy, MD 09/26/22 2333

## 2022-09-26 NOTE — ED Triage Notes (Addendum)
Patient was discharged from [facility] earlier today for "tracheostomy care" per AVS.  While being transported home patient began complaining of SOB and requested to return to the hospital, transport service diverted to closest hospital.  Per transport crew patient's SPO2 while en route to hospital was 91%, with no new supplemental oxygen demand.  Patient AOx4 and speaking in full sentences on arrival to [facility].  Upon questioning patient endorses substernal chest pain.

## 2022-09-26 NOTE — Assessment & Plan Note (Addendum)
Chronic DVT Resume Eliquis

## 2022-09-26 NOTE — Assessment & Plan Note (Addendum)
Due to hypercapnia. Mentation improving after placed on the vent.  Monitor closely. --Stat ABG if lethargic --Ventilation per PCCM
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
## 2022-09-27 ENCOUNTER — Other Ambulatory Visit: Payer: Self-pay

## 2022-09-27 ENCOUNTER — Inpatient Hospital Stay: Payer: Medicare Other

## 2022-09-27 ENCOUNTER — Encounter: Payer: Self-pay | Admitting: Internal Medicine

## 2022-09-27 DIAGNOSIS — J9622 Acute and chronic respiratory failure with hypercapnia: Secondary | ICD-10-CM | POA: Diagnosis not present

## 2022-09-27 DIAGNOSIS — D638 Anemia in other chronic diseases classified elsewhere: Secondary | ICD-10-CM | POA: Diagnosis present

## 2022-09-27 DIAGNOSIS — J9621 Acute and chronic respiratory failure with hypoxia: Secondary | ICD-10-CM | POA: Diagnosis not present

## 2022-09-27 LAB — GLUCOSE, CAPILLARY
Glucose-Capillary: 101 mg/dL — ABNORMAL HIGH (ref 70–99)
Glucose-Capillary: 104 mg/dL — ABNORMAL HIGH (ref 70–99)
Glucose-Capillary: 84 mg/dL (ref 70–99)
Glucose-Capillary: 91 mg/dL (ref 70–99)

## 2022-09-27 LAB — BLOOD GAS, ARTERIAL
Acid-Base Excess: 20.8 mmol/L — ABNORMAL HIGH (ref 0.0–2.0)
Acid-Base Excess: 23.1 mmol/L — ABNORMAL HIGH (ref 0.0–2.0)
Acid-Base Excess: 22 mmol/L — ABNORMAL HIGH (ref 0.0–2.0)
Bicarbonate: 51.4 mmol/L — ABNORMAL HIGH (ref 20.0–28.0)
Bicarbonate: 47.3 mmol/L — ABNORMAL HIGH (ref 20.0–28.0)
Bicarbonate: 48.5 mmol/L — ABNORMAL HIGH (ref 20.0–28.0)
FIO2: 40 %
FIO2: 40 %
MECHVT: 500 mL
MECHVT: 500 mL
Mechanical Rate: 24
Mechanical Rate: 16
O2 Saturation: 91.8 %
O2 Saturation: 94.3 %
O2 Saturation: 94.3 %
PEEP: 5 cmH2O
PEEP: 5 cmH2O
Patient temperature: 37
Patient temperature: 37
Patient temperature: 37
pCO2 arterial: 112 mmHg (ref 32–48)
pCO2 arterial: 46 mmHg (ref 32–48)
pCO2 arterial: 58 mmHg — ABNORMAL HIGH (ref 32–48)
pH, Arterial: 7.27 — ABNORMAL LOW (ref 7.35–7.45)
pH, Arterial: 7.62 (ref 7.35–7.45)
pH, Arterial: 7.53 — ABNORMAL HIGH (ref 7.35–7.45)
pO2, Arterial: 62 mmHg — ABNORMAL LOW (ref 83–108)
pO2, Arterial: 56 mmHg — ABNORMAL LOW (ref 83–108)
pO2, Arterial: 62 mmHg — ABNORMAL LOW (ref 83–108)

## 2022-09-27 LAB — BASIC METABOLIC PANEL
Anion gap: 7 (ref 5–15)
BUN: 22 mg/dL (ref 8–23)
CO2: 44 mmol/L — ABNORMAL HIGH (ref 22–32)
Calcium: 8.7 mg/dL — ABNORMAL LOW (ref 8.9–10.3)
Chloride: 92 mmol/L — ABNORMAL LOW (ref 98–111)
Creatinine, Ser: 1.22 mg/dL (ref 0.61–1.24)
GFR, Estimated: >60 mL/min (ref >60)
Glucose, Bld: 121 mg/dL — ABNORMAL HIGH (ref 70–99)
Potassium: 4.1 mmol/L (ref 3.5–5.1)
Sodium: 143 mmol/L (ref 135–145)

## 2022-09-27 LAB — RESPIRATORY PANEL BY PCR

## 2022-09-27 LAB — CBC
HCT: 37.1 % — ABNORMAL LOW (ref 39.0–52.0)
Hemoglobin: 9 g/dL — ABNORMAL LOW (ref 13.0–17.0)
MCH: 21.5 pg — ABNORMAL LOW (ref 26.0–34.0)
MCHC: 24.3 g/dL — ABNORMAL LOW (ref 30.0–36.0)
MCV: 88.5 fL (ref 80.0–100.0)
Platelets: 205 10*3/uL (ref 150–400)
RBC: 4.19 MIL/uL — ABNORMAL LOW (ref 4.22–5.81)
RDW: 18.2 % — ABNORMAL HIGH (ref 11.5–15.5)
WBC: 9.8 10*3/uL (ref 4.0–10.5)
nRBC: 1.2 % — ABNORMAL HIGH (ref 0.0–0.2)

## 2022-09-27 LAB — PROCALCITONIN: Procalcitonin: 0.53 ng/mL

## 2022-09-27 LAB — HEMOGLOBIN A1C
Hgb A1c MFr Bld: 4.6 % — ABNORMAL LOW (ref 4.8–5.6)
Mean Plasma Glucose: 85.32 mg/dL

## 2022-09-27 LAB — CBG MONITORING, ED: Glucose-Capillary: 97 mg/dL (ref 70–99)

## 2022-09-27 LAB — MRSA NEXT GEN BY PCR, NASAL: MRSA by PCR Next Gen: NOT DETECTED

## 2022-09-27 LAB — SARS CORONAVIRUS 2 BY RT PCR: SARS Coronavirus 2 by RT PCR: NEGATIVE

## 2022-09-27 LAB — TROPONIN I (HIGH SENSITIVITY): Troponin I (High Sensitivity): 11 ng/L (ref <18)

## 2022-09-27 MED ORDER — ENOXAPARIN SODIUM 100 MG/ML IJ SOSY
0.5000 mg/kg | PREFILLED_SYRINGE | INTRAMUSCULAR | Status: DC
  Administered 2022-09-27 – 2022-09-28 (×2): 92.5 mg via SUBCUTANEOUS
  Filled 2022-09-27 (×2): qty 1

## 2022-09-27 MED ORDER — FENTANYL CITRATE PF 50 MCG/ML IJ SOSY
50.0000 ug | PREFILLED_SYRINGE | INTRAMUSCULAR | Status: DC | PRN
  Administered 2022-09-27 (×2): 50 ug via INTRAVENOUS
  Filled 2022-09-27: qty 1

## 2022-09-27 MED ORDER — APIXABAN 2.5 MG PO TABS: 2.5000 mg | ORAL_TABLET | Freq: Two times a day (BID) | ORAL | Status: DC

## 2022-09-27 MED ORDER — DEXMEDETOMIDINE HCL IN NACL 400 MCG/100ML IV SOLN
0.0000 ug/kg/h | INTRAVENOUS | Status: DC
  Administered 2022-09-27: 0.4 ug/kg/h via INTRAVENOUS
  Filled 2022-09-27: qty 100

## 2022-09-27 MED ORDER — MIDAZOLAM HCL 2 MG/2ML IJ SOLN
2.0000 mg | INTRAMUSCULAR | Status: AC
  Administered 2022-09-27: 2 mg via INTRAVENOUS

## 2022-09-27 MED ORDER — FUROSEMIDE 20 MG PO TABS: 80.0000 mg | ORAL_TABLET | Freq: Two times a day (BID) | ORAL | Status: DC

## 2022-09-27 MED ORDER — POLYETHYLENE GLYCOL 3350 17 G PO PACK
17.0000 g | PACK | Freq: Every day | ORAL | Status: DC
  Filled 2022-09-27 (×3): qty 1

## 2022-09-27 MED ORDER — PROPOFOL 1000 MG/100ML IV EMUL
5.0000 ug/kg/min | INTRAVENOUS | Status: DC
  Administered 2022-09-27: 20 ug/kg/min via INTRAVENOUS
  Administered 2022-09-27: 10 ug/kg/min via INTRAVENOUS
  Administered 2022-09-28: 15 ug/kg/min via INTRAVENOUS
  Administered 2022-09-28 (×3): 20 ug/kg/min via INTRAVENOUS
  Administered 2022-09-29: 15 ug/kg/min via INTRAVENOUS
  Filled 2022-09-27 (×12): qty 100

## 2022-09-27 MED ORDER — MIDAZOLAM HCL 2 MG/2ML IJ SOLN
1.0000 mg | INTRAMUSCULAR | Status: DC | PRN
  Administered 2022-09-27 – 2022-09-28 (×3): 2 mg via INTRAVENOUS
  Filled 2022-09-27 (×5): qty 2

## 2022-09-27 MED ORDER — FENTANYL CITRATE PF 50 MCG/ML IJ SOSY
50.0000 ug | PREFILLED_SYRINGE | INTRAMUSCULAR | Status: DC | PRN
  Administered 2022-09-27: 50 ug via INTRAVENOUS
  Administered 2022-09-28: 100 ug via INTRAVENOUS
  Filled 2022-09-27: qty 1
  Filled 2022-09-27 (×3): qty 2

## 2022-09-27 MED ORDER — INSULIN ASPART 100 UNIT/ML IJ SOLN
0.0000 [IU] | INTRAMUSCULAR | Status: DC
  Administered 2022-09-28 – 2022-09-29 (×4): 2 [IU] via SUBCUTANEOUS
  Filled 2022-09-27 (×4): qty 1

## 2022-09-27 MED ORDER — ORAL CARE MOUTH RINSE: 15.0000 mL | OROMUCOSAL | Status: DC | PRN

## 2022-09-27 MED ORDER — MIDODRINE HCL 5 MG PO TABS
10.0000 mg | ORAL_TABLET | Freq: Three times a day (TID) | ORAL | Status: DC
  Administered 2022-10-01 – 2022-10-08 (×19): 10 mg via ORAL
  Filled 2022-09-27 (×22): qty 2

## 2022-09-27 MED ORDER — MESALAMINE 1.2 G PO TBEC
2.4000 g | DELAYED_RELEASE_TABLET | Freq: Two times a day (BID) | ORAL | Status: DC
  Administered 2022-09-27 – 2022-10-08 (×18): 2.4 g via ORAL
  Filled 2022-09-27 (×24): qty 2

## 2022-09-27 MED ORDER — CHLORHEXIDINE GLUCONATE CLOTH 2 % EX PADS
6.0000 | MEDICATED_PAD | Freq: Every day | CUTANEOUS | Status: DC
  Administered 2022-09-28 – 2022-10-08 (×8): 6 via TOPICAL

## 2022-09-27 MED ORDER — ORAL CARE MOUTH RINSE
15.0000 mL | OROMUCOSAL | Status: DC
  Administered 2022-09-27 – 2022-09-30 (×25): 15 mL via OROMUCOSAL

## 2022-09-27 MED ORDER — DOCUSATE SODIUM 50 MG/5ML PO LIQD
100.0000 mg | Freq: Two times a day (BID) | ORAL | Status: DC
  Administered 2022-10-03 – 2022-10-04 (×2): 100 mg
  Filled 2022-09-27 (×6): qty 10

## 2022-09-27 NOTE — Assessment & Plan Note (Signed)
Hbg stable. No signs of bleeding. Monitor CBC

## 2022-09-27 NOTE — Progress Notes (Signed)
SLP Cancellation Note  Patient Details Name: Gary [name] MRN: [id number] DOB: December 19, 1955   Cancelled treatment:       Reason Eval/Treat Not Completed: Patient not medically ready (chart reviewed; met w/ pt and consulted NSG)  Pt is currently admitting to CCU for further care. He is on the Vent d/t CO2 issues. He has a Cuffed trach(7.0 XLT proximal cuffed). Pt has his current PMV w/ him which was cleaned and inspected.  ST services will f/u w/ pt tomorrow for further PMV education. Pt explained that he will need to be on Trach Collar to wear his PMV. Pt nodded head in agreement. Will post precautions/education then.  NSG agreed.     Jerilynn Som, MS, CCC-SLP Speech Language Pathologist Rehab Services; [facility] [phone] (ascom) Curran Lenderman 09/27/2022, 4:07 PM

## 2022-09-27 NOTE — Progress Notes (Signed)
ABG attempted, pt confused, grabbing at this RT arm. Refusing to keep arm still, unable to obtain ABG. RN and MD aware

## 2022-09-27 NOTE — Progress Notes (Signed)
I evaluated the patient again due to his need for ventilatory therapy.  Even though patient is using speaking valve, it does not look like his tracheostomy tube is cuffed.  I offered to replace the patient's tracheostomy tube over a coud catheter so that we can connect him to a ventilator given his hypercapnic respiratory failure.  Patient unfortunately at this time is not agreeing to same.  I also offered the patient a little bit of sedation with benzodiazepines.  However with the respiratory therapist as well as the registered nurse present, patient flat out refused.  Patient has woken up a little bit more compared to my initial evaluation.  And patient is maintaining his sats over 90%.  Therefore at this time I will seek a second opinion, from PCCM space NP who is actually just entering the room as I left.  I will change the patient's level of care to the ICU.

## 2022-09-27 NOTE — ED Notes (Signed)
This RN received report thi mornign from prior nurse. Prior nurse mentioned patient made a threatening comment to him. An hour later the prior nurse stated the patient apologized for his comment and said "I am sorry I stated, maybe my mind isnt right". This RN was also told in report pt's CO2 is 112. This RN allowed patient to rest.

## 2022-09-27 NOTE — ED Notes (Signed)
This RN read new ABG results.No changes in the CO2 or the pH level. This RN contacted hospitalist about new results on ABG and orders placed. There was an order for progressive but no orders addressing the evaluated CO2. This RN was told the to watch his mental status. This RN

## 2022-09-27 NOTE — Hospital Course (Signed)
Gary [name] is a 67 y.o. male with medical history significant of [name]  is a 67 y.o. 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 Recent admision on 09/07/2022 for BRBPR  s/p EGD and colonscopy. S.p. augmentin for divertifulitis.  Patient was discharged on Sep 12, 2022 to [facility] inpatient rehab.  Pt presented to our ED on evening of 09/26/22, reported having just been discharged from rehab that day.  Reported a cough and thick mucus from his trach.  During EMS transport from rehab to home, he had sudden shortness of breath and severe coughing fit, so he was brought to the ED for evaluation.  Initially O2 sats stabilized on trach collar O2, but due to recurrent desaturations and severe hypercapnea, he required admission and placement in mechanical ventilation.   PCCM team is following for respiratory management.

## 2022-09-27 NOTE — Assessment & Plan Note (Signed)
POA. I agree with wound description as outlined.  Continue diligent wound care, offloading pressure frequently & monitor for s/sx's of infection. Pressure Injury 09/27/22 Buttocks Left;Medial;Upper Stage 2 -  Partial thickness loss of dermis presenting as a shallow open injury with a red, pink wound bed without slough. Left buttocks small stage 2 (Active)  09/27/22 1130  Location: Buttocks  Location Orientation: Left;Medial;Upper  Staging: Stage 2 -  Partial thickness loss of dermis presenting as a shallow open injury with a red, pink wound bed without slough.  Wound Description (Comments): Left buttocks small stage 2  Present on Admission: Yes

## 2022-09-27 NOTE — Progress Notes (Signed)
Trach changed to 7.0 XLT proximal cuffed by RT without incident.

## 2022-09-27 NOTE — Progress Notes (Signed)
Asked by [facility] to consult on patient for ICU admission due to acute on chronic hypercapnic/hypoxic respiratory failure with chronic tracheostomy requiring overnight mechanical ventilatory support. Patient presented with main complaint of productive cough and dyspnea. ABG revealed acute on chronic respiratory acidosis.  Upon bedside assessment, patient is A&O- non-toxic appearing- speaking with passi-muir valve in place on trach collar support, vitals are stable.  We discussed the need to place him on mechanical ventilatory support overnight due to his CO2 being high. However in order to accomplish this RT needs to change out his tracheostomy for a cuffed tracheostomy. The patient refused stating that would hurt and he will not change his tracheostomy. Attending provider had already offered pre-medication for this intervention which the patient refused. We discussed why this is important and the patient refused to allow his tracheostomy tube to be changed.   Discussed with attending provider, this patient has refused mechanical ventilatory support in the past. Would recommend continuing trach collar oxygen support to maintain SpO2 > 90%. Consider f/u AM ABG.  Please re-consult if the patient's mentation changes acutely or the patient changes his mind and mechanical ventilatory support is needed in the future.       Cheryll Cockayne Rust-Chester, AGACNP-BC Acute Care Nurse Practitioner [facility]    [phone] / [phone] Please see Amion for pager details.
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
## 2022-09-27 NOTE — Assessment & Plan Note (Signed)
Body mass index is 51.37 kg/m. Complicates overall care and prognosis.  Recommend lifestyle modifications including physical activity and diet for weight loss and overall long-term health.  

## 2022-09-27 NOTE — Assessment & Plan Note (Signed)
CXR c/w pulmonary edema Given IV Lasix 80 mg in the ED Continue intermittent diuresis as BP and renal function tolerates Strict I/O's & daily weights

## 2022-09-27 NOTE — ED Notes (Signed)
This RN received report n
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
## 2022-09-27 NOTE — Progress Notes (Signed)
ABG attempted again, pt pulling away refusing to let this RT to obtain ABG. RN & MD aware

## 2022-09-27 NOTE — Assessment & Plan Note (Signed)
Obesity hypoventilation syndrome Mechanical ventilation per PCCM

## 2022-09-27 NOTE — Assessment & Plan Note (Addendum)
Pseudomonas colonization Possible CAP Chronic tracheostomy Nocturnal ventilation dependent OSA/OHS ABG's improved on vent. --PCCM following, appreciate assistance --s/p bronchoscopy --BAL cultures grew pseudomonas, felt to be colonization --Started on Cipro for BAL cx growing Pseudomonas and pt with copious thick secretions --Diuresis as needed  --Monitor clinically off antibiotics --Scheduled mucinex added to thin secretions --Saline nebs --Ventilation overnight --HH agency to bring pt's home vent & then can transfer to floor

## 2022-09-27 NOTE — ED Notes (Signed)
This nurse and two other nurses came into the patient's room to reposition him in bed.  After he was repostioned to his satisfaction, the patient pointed out his primary nurse and said that when he arrived a seven o'clock, that his primary nurse didn't do anything for him, and that he was going to "Slap the shit out of him."  This nurse asked the patient why he would threaten his nurse in that manner, and he said that the nurse came in the room when he got on shift, and didn't change his brief.  This nurse explained to the patient that he arrived at almost 8pm, and that he was not here at 7pm.  The patient apologized saying that he didn't mean any harm, but he didn't like the way he was treated being left in a soiled brief.  This nurse told the patient that he was being helped at this time by several nurses, and that he did not need to physically threaten hospital staff.  Additionally, this nurse was present when the patient arrived.  He did not have a soiled brief on arrival.

## 2022-09-27 NOTE — Assessment & Plan Note (Addendum)
AKI - 6/16 Cr 1.40 >> 1.85 --Encourage PO hydration --Monitor BMP

## 2022-09-27 NOTE — Progress Notes (Signed)
Progress Note   Patient: Gary [name] ZOX:[id number] DOB: March 31, 1956 DOA: 09/26/2022     1 DOS: the patient was seen and examined on 09/27/2022   Brief hospital course: Gary [name] is a 67 y.o. male with medical history significant of Gary [name]  is a 67 y.o. 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 Recent admision on 09/07/2022 for BRBPR  s/p EGD and colonscopy. S.p. augmentin for divertifulitis.  Patient was discharged on Sep 12, 2022 to [facility] inpatient rehab.  Pt presented to our ED on evening of 09/26/22, reported having just been discharged from rehab that day.  Reported a cough and thick mucus from his trach.  During EMS transport from rehab to home, he had sudden shortness of breath and severe coughing fit, so he was brought to the ED for evaluation.  Initially O2 sats stabilized on trach collar O2, but due to recurrent desaturations and severe hypercapnea, he required admission and placement in mechanical ventilation.   PCCM team is following for respiratory management.     Assessment and Plan: * Acute on chronic respiratory failure with hypoxia and hypercapnia ([name]) ABG 7.27 / 112 / 70 -- he declined to be placed on vent due to needing trach changed to cuffed. Repeat ABG unchanged 7.27 / 112 /62 but progressive hypoxemia. --PCCM following, appreciate assistance --Pt ultimately allowed cuffed trach to be placed & started on mechanical ventilation later this morning --pCO2 improved after few hours on vent (46)  Anticoagulated Chronic DVT Resume Eliquis  Acute on chronic diastolic  (congestive) heart failure ([name]) CXR c/w pulmonary edema Given IV Lasix 80 mg in the ED Continue intermittent diuresis as BP and renal function tolerates Strict I/O's & daily weights  OSA (obstructive sleep apnea) Obesity hypoventilation syndrome Mechanical ventilation per PCCM  Chronic kidney disease, stage 3a ([name]) Renal function stable. Monitor BMP  Morbid obesity with BMI of 50.0-59.9, adult ([name]) Body mass index is 51.37 kg/m. Complicates overall care and prognosis.  Recommend lifestyle modifications including physical activity and diet for weight loss and overall long-term health.  Iron deficiency anemia Hbg stable. Monitor CBC.  Anemia of chronic disease Hbg stable. No signs of bleeding. Monitor CBC  Acute metabolic encephalopathy Due to hypercapnia. Mentation improving after placed on the vent.  Monitor closely. --Stat ABG if lethargic --Ventilation per PCCM  Pressure injury of skin POA. I agree with wound description as outlined.  Continue diligent wound care, offloading pressure frequently & monitor for s/sx's of infection. Pressure Injury 09/27/22 Buttocks Left;Medial;Upper Stage 2 -  Partial thickness loss of dermis presenting as a shallow open injury with a red, pink wound bed without slough. Left buttocks small stage 2 (Active)  09/27/22 1130  Location: Buttocks  Location Orientation: Left;Medial;Upper  Staging: Stage 2 -  Partial thickness loss of dermis presenting as a shallow open injury with a red, pink wound bed without slough.  Wound Description (Comments): Left buttocks small stage 2  Present on Admission: Yes           Subjective: Pt seen in ICU this AM, on ventilator after he ultimately allowed trach to be changed to a  cuffed model.  Mentation already improving per ICU NP at bedside during encounter.  Pt feels like he's not getting air, gets very anxious due to discomfort with trach cuff inflated.     Physical Exam: Vitals:   09/27/22 1500  09/27/22 1505 09/27/22 1600 09/27/22 1619  BP: 104/81  117/76   Pulse: 87 86 80 79  Resp: (!) 27 (!) 21 (!) 21 20  Temp:      TempSrc:      SpO2: 97% 96% 92% 95%  Weight:      Height:       General exam: awake, alert, no acute distress, morbidly obese HEENT: trach present connected to vent, moist mucus membranes, hearing grossly normal  Respiratory system: diminished bases, no wheezes, on ventilator. Cardiovascular system: normal S1/S2, RRR, trace pedal edema.   Gastrointestinal system: soft, NT, ND, no HSM felt, +bowel sounds. Central nervous system: exam limited by pt on vent, unable to vocalize due to cuffed trach, grossly non-focal exam Extremities: moves all, trace BLE edema, normal tone Skin: dry, intact, normal temperature Psychiatry: anxious mood, congruent affect   Data Reviewed:  Notable labs --- Cl 92, BUN 44, glucose 121, Ca 8.7, Hbg 9.0, procal 0.53   ABG's 7.27 >> 7.62 / 112 >> 46 / 62 >> 56   Family Communication: None  Disposition: Status is: Inpatient Remains inpatient appropriate because: severity of illness with severe hypercapnia and encephalopathy   Planned Discharge Destination: Home    Time spent: 65 minutes including time at bedside and in coordination of care  Author: Pennie Banter, DO 09/27/2022 5:12 PM  For on call review [email or web address].

## 2022-09-27 NOTE — ED Notes (Signed)
This RN was told ICU requested the pt to have a repeat ABG because patient is refusing is vent and trach change and doesn't require ICU care. This RN messaged hospitalist about ABG order. ABG order placed. Respiratory called.

## 2022-09-28 DIAGNOSIS — J9622 Acute and chronic respiratory failure with hypercapnia: Secondary | ICD-10-CM | POA: Diagnosis not present

## 2022-09-28 DIAGNOSIS — J9621 Acute and chronic respiratory failure with hypoxia: Secondary | ICD-10-CM | POA: Diagnosis not present

## 2022-09-28 LAB — BLOOD GAS, ARTERIAL
Acid-Base Excess: 19.2 mmol/L — ABNORMAL HIGH (ref 0.0–2.0)
Bicarbonate: 45.5 mmol/L — ABNORMAL HIGH (ref 20.0–28.0)
FIO2: 35 %
MECHVT: 500 mL
O2 Saturation: 91.4 %
PEEP: 5 cmH2O
Patient temperature: 37
RATE: 12 resp/min
pCO2 arterial: 57 mmHg — ABNORMAL HIGH (ref 32–48)
pH, Arterial: 7.51 — ABNORMAL HIGH (ref 7.35–7.45)
pO2, Arterial: 56 mmHg — ABNORMAL LOW (ref 83–108)

## 2022-09-28 LAB — BASIC METABOLIC PANEL
Anion gap: 10 (ref 5–15)
BUN: 33 mg/dL — ABNORMAL HIGH (ref 8–23)
CO2: 39 mmol/L — ABNORMAL HIGH (ref 22–32)
Calcium: 8.8 mg/dL — ABNORMAL LOW (ref 8.9–10.3)
Chloride: 93 mmol/L — ABNORMAL LOW (ref 98–111)
Creatinine, Ser: 1.42 mg/dL — ABNORMAL HIGH (ref 0.61–1.24)
GFR, Estimated: 55 mL/min — ABNORMAL LOW (ref >60)
Glucose, Bld: 129 mg/dL — ABNORMAL HIGH (ref 70–99)
Potassium: 3.4 mmol/L — ABNORMAL LOW (ref 3.5–5.1)
Sodium: 142 mmol/L (ref 135–145)

## 2022-09-28 LAB — CBC WITH DIFFERENTIAL/PLATELET
Abs Immature Granulocytes: 0.04 10*3/uL (ref 0.00–0.07)
Basophils Absolute: 0 10*3/uL (ref 0.0–0.1)
Basophils Relative: 0 %
Eosinophils Absolute: 0 10*3/uL (ref 0.0–0.5)
Eosinophils Relative: 0 %
HCT: 34.1 % — ABNORMAL LOW (ref 39.0–52.0)
Hemoglobin: 8.6 g/dL — ABNORMAL LOW (ref 13.0–17.0)
Immature Granulocytes: 1 %
Lymphocytes Relative: 7 %
Lymphs Abs: 0.5 10*3/uL — ABNORMAL LOW (ref 0.7–4.0)
MCH: 20.9 pg — ABNORMAL LOW (ref 26.0–34.0)
MCHC: 25.2 g/dL — ABNORMAL LOW (ref 30.0–36.0)
MCV: 82.8 fL (ref 80.0–100.0)
Monocytes Absolute: 0.2 10*3/uL (ref 0.1–1.0)
Monocytes Relative: 3 %
Neutro Abs: 5.9 10*3/uL (ref 1.7–7.7)
Neutrophils Relative %: 89 %
Platelets: 199 10*3/uL (ref 150–400)
RBC: 4.12 MIL/uL — ABNORMAL LOW (ref 4.22–5.81)
RDW: 18.6 % — ABNORMAL HIGH (ref 11.5–15.5)
WBC: 6.6 10*3/uL (ref 4.0–10.5)
nRBC: 0.9 % — ABNORMAL HIGH (ref 0.0–0.2)

## 2022-09-28 LAB — CULTURE, BAL-QUANTITATIVE W GRAM STAIN

## 2022-09-28 LAB — GLUCOSE, CAPILLARY
Glucose-Capillary: 117 mg/dL — ABNORMAL HIGH (ref 70–99)
Glucose-Capillary: 112 mg/dL — ABNORMAL HIGH (ref 70–99)
Glucose-Capillary: 104 mg/dL — ABNORMAL HIGH (ref 70–99)
Glucose-Capillary: 152 mg/dL — ABNORMAL HIGH (ref 70–99)
Glucose-Capillary: 138 mg/dL — ABNORMAL HIGH (ref 70–99)
Glucose-Capillary: 122 mg/dL — ABNORMAL HIGH (ref 70–99)

## 2022-09-28 LAB — CULTURE, BLOOD (ROUTINE X 2)

## 2022-09-28 LAB — PHOSPHORUS: Phosphorus: 1.8 mg/dL — ABNORMAL LOW (ref 2.5–4.6)

## 2022-09-28 LAB — TRIGLYCERIDES: Triglycerides: 145 mg/dL (ref <150)

## 2022-09-28 LAB — MAGNESIUM: Magnesium: 2.6 mg/dL — ABNORMAL HIGH (ref 1.7–2.4)

## 2022-09-28 LAB — PROCALCITONIN: Procalcitonin: 0.27 ng/mL

## 2022-09-28 MED ORDER — DEXMEDETOMIDINE HCL IN NACL 400 MCG/100ML IV SOLN
0.0000 ug/kg/h | INTRAVENOUS | Status: DC
  Administered 2022-09-28: 0.6 ug/kg/h via INTRAVENOUS
  Administered 2022-09-28: 0.4 ug/kg/h via INTRAVENOUS
  Administered 2022-09-29 (×4): 0.6 ug/kg/h via INTRAVENOUS
  Filled 2022-09-28 (×8): qty 100

## 2022-09-28 MED ORDER — SODIUM CHLORIDE 3 % IN NEBU
4.0000 mL | INHALATION_SOLUTION | Freq: Two times a day (BID) | RESPIRATORY_TRACT | Status: DC
  Administered 2022-09-28 – 2022-09-30 (×5): 4 mL via RESPIRATORY_TRACT
  Filled 2022-09-28 (×5): qty 4

## 2022-09-28 MED ORDER — SODIUM CHLORIDE 0.9 % IV SOLN: INTRAVENOUS | Status: DC | PRN

## 2022-09-28 MED ORDER — POTASSIUM PHOSPHATES 15 MMOLE/5ML IV SOLN
15.0000 mmol | Freq: Once | INTRAVENOUS | Status: AC
  Administered 2022-09-28: 15 mmol via INTRAVENOUS
  Filled 2022-09-28: qty 5

## 2022-09-28 NOTE — Progress Notes (Signed)
SLP Cancellation Note  Patient Details Name: Gary [name] MRN: [id number] DOB: 1955-08-27   Cancelled treatment:       Reason Eval/Treat Not Completed:  (chart reviewed; consulted MD re: pt's status)  Pt remains on Vent support; not on trach collar yet (as is his Baseline during the day prior). He cannot wear the PMV until on trach collar. Pt nodded in understanding.  Discussed w/ MD pt's presentation this hospitalization. Pt was being discharged home from another facility on the day he was brought to [facility] d/t CO2 issues. He finally agreed to a trach change in order that he could be on night time Vent support to decreased the buildup of CO2 issue.  MD stated there are no other changes/decline from pt's Baseline status. MD stated when he transitions to trach collar, he can wear his PMV for verbal communication w/ NSG support/monitoring PRN. MD stated no apparent need for ST services at this time.  ST services will sign off; MD to reconsult if new needs arise during admit. MD agreed. Updated NSG and posted signage re: PMV use/care.    Jerilynn Som, MS, CCC-SLP Speech Language Pathologist Rehab Services; [facility] [phone] (ascom) Paislee Szatkowski 09/28/2022, 12:53 PM

## 2022-09-28 NOTE — Progress Notes (Signed)
PHARMACY CONSULT NOTE  Pharmacy Consult for Electrolyte Monitoring and Replacement   Recent Labs: Potassium (mmol/L)  Date Value  09/28/2022 3.4 (L)  04/15/2014 3.9   Magnesium (mg/dL)  Date Value  29/56/2130 2.6 (H)  08/21/2012 1.9   Calcium (mg/dL)  Date Value  86/57/8469 8.8 (L)   Calcium, Total (mg/dL)  Date Value  62/95/2841 8.3 (L)   Albumin (g/dL)  Date Value  32/44/0102 3.2 (L)  04/15/2014 2.9 (L)   Phosphorus (mg/dL)  Date Value  72/53/6644 1.8 (L)   Sodium (mmol/L)  Date Value  09/28/2022 142  03/15/2020 145 (H)  04/15/2014 140     Assessment: 67 y.o. male w/ PMH of morbid obesity, trach collar dependent, with chronic respiratory failure due to OSA, COPD, hypertension, diastolic CHF as well as CKD and hypertension, with frequent prior hospitalizations, who presents with shortness of breath   Goal of Therapy:  Electrolytes WNL  Plan:  ---15 mmol iv potassium phosphate x 1 (contains 22 mEq iv potassium) ---recheck electrolytes in am  Lowella Bandy ,PharmD Clinical Pharmacist 09/28/2022 7:12 AM

## 2022-09-28 NOTE — Procedures (Signed)
PROCEDURE: BRONCHOSCOPY Therapeutic Aspiration of Tracheobronchial Tree and BAL  PROCEDURE DATE: 09/28/2022  TIME:  NAME:  Gary [name]  DOB:30-Apr-1955  MRN: [id number] LOC:  IC09A/IC09A-AA    HOSP DAY: @LENGTHOFSTAYDAYS @ CODE STATUS:      Code Status Orders  (From admission, onward)           Start     Ordered   09/26/22 2258  Full code  Continuous       Question:  By:  Answer:  Consent: discussion documented in EHR   09/26/22 2259           Code Status History     Date Active Date Inactive Code Status Order ID Comments User Context   09/07/2022 1720 09/12/2022 2042 Full Code [id number]  Leroy Sea, MD ED   08/13/2022 0014 08/21/2022 2036 Full Code [id number]  John Giovanni, MD ED   07/10/2022 1322 07/26/2022 1450 Full Code [id number]  Verdene Lennert, MD ED   04/11/2022 1359 04/16/2022 1854 Full Code [id number]  Cox, Amy N, DO ED   02/03/2022 0421 04/06/2022 1841 Full Code [id number]  Mansy, Vernetta Honey, MD ED   12/30/2021 2311 01/06/2022 1700 Full Code [id number]  Rust-Chester, Cecelia Byars, NP ED   12/19/2021 0113 12/26/2021 2351 Full Code [id number]  Rust-Chester, Cecelia Byars, NP ED   09/19/2021 1142 12/13/2021 2027 Full Code [id number]  Lorretta Harp, MD ED   08/07/2021 1057 08/15/2021 2025 Full Code [id number]  Lorretta Harp, MD ED   07/20/2021 1625 07/23/2021 1921 Full Code [id number]  Judithe Modest, NP ED   05/03/2021 2155 05/08/2021 1908 Full Code [id number]  Andris Baumann, MD ED   04/04/2021 1923 04/12/2021 0031 Full Code [id number]  Mansy, Vernetta Honey, MD ED   03/11/2021 0156 03/21/2021 2310 Full Code [id number]  Andris Baumann, MD Inpatient   02/18/2021 2114 02/23/2021 0759 Full Code [id number]  Rust-Chester, Cecelia Byars, NP ED   02/25/2020 1310 03/05/2020 2206 Full Code [id number]  Vida Rigger, MD ED   08/04/2019 1910 08/10/2019 2239 Full Code [id number]  Mansy, Vernetta Honey, MD ED   05/26/2019 1806 06/01/2019 1711 Full Code [id number]  Alford Highland, MD ED   10/14/2018 0330 10/22/2018 1951 Full Code [id number]   Oralia Manis, MD Inpatient   06/25/2018 0026 06/28/2018 1842 Full Code [id number]  Oralia Manis, MD ED   05/29/2018 0001 05/31/2018 1732 Full Code [id number]  Altamese Dilling, MD Inpatient   04/12/2017 1353 04/15/2017 1720 Full Code [id number]  [name], MD Inpatient   01/10/2015 1953 01/14/2015 1703 Full Code [id number]  [name], MD Inpatient   03/27/2014 1847 04/02/2014 1512 Full Code [id number]  Frederik Schmidt, MD ED           Indications/Preliminary Diagnosis:   Consent: (Place X beside choice/s below)  The benefits, risks and possible complications of the procedure were        explained to:  ___ patient  ___ patient's family  ___ other:___________  who verbalized understanding and gave:  ___ verbal  ___ written  ___ verbal and written  ___ telephone  ___ other:________ consent.    x  Unable to obtain consent; procedure performed on emergent basis.     Other:       PRESEDATION ASSESSMENT: History and Physical has been performed. Patient meds and allergies have been reviewed. Presedation airway examination has been performed and documented. Baseline vital signs, sedation score, oxygenation status, and cardiac rhythm were reviewed. Patient was deemed to be in satisfactory condition  to undergo the procedure.    PREMEDICATIONS:   Sedative/Narcotic Amt Dose   Versed  mg   Fentanyl 100 mcg  Diprivan  mg            PROCEDURE DETAILS: Timeout performed and correct patient, name, & ID confirmed. Following prep per Pulmonary policy, appropriate sedation was administered. The Bronchoscope was inserted in to oral cavity with bite block in place. Therapeutic aspiration of Tracheobronchial tree was performed.  Airway exam proceeded with findings, technical procedures, and specimen collection as noted below. At the end of exam the scope was withdrawn without incident. Impression and Plan as noted below.           Airway Prep (Place X beside choice below)   1%  Transtracheal Lidocaine Anesthetization 7 cc   Patient prepped per Bronchoscopy Lab Policy       Insertion Route (Place X beside choice below)   Nasal   Oral  x Endotracheal Tube   Tracheostomy   INTRAPROCEDURE MEDICATIONS:  Sedative/Narcotic Amt Dose   Versed  mg   Fentanyl  mcg  Diprivan gtt mg       Medication Amt Dose  Medication Amt Dose  Lidocaine 1%  cc  Epinephrine 1:10,000 sol  cc  Xylocaine 4%  cc  Cocaine  cc   TECHNICAL PROCEDURES: (Place X beside choice below)   Procedures  Description    None     Electrocautery     Cryotherapy     Balloon Dilatation     Bronchography     Stent Placement   x  Therapeutic Aspiration RLL and LLL    Laser/Argon Plasma    Brachytherapy Catheter Placement    Foreign Body Removal         SPECIMENS (Sites): (Place X beside choice below)  Specimens Description   No Specimens Obtained     Washings   x Lavage RML   Biopsies    Fine Needle Aspirates    Brushings    Sputum    FINDINGS:  ESTIMATED BLOOD LOSS: none COMPLICATIONS/RESOLUTION: none      IMPRESSION:POST-PROCEDURE DX:   Mucus plugging of bilateral lower lobes which was evacuated via multiple therapeutic aspirations. Gram stain and cultures with BAL.   RECOMMENDATION/PLAN:  Wean from ventilator s/p bronchoscopy     Vida Rigger, M.D.  Pulmonary & Critical Care Medicine  [facility]

## 2022-09-28 NOTE — Progress Notes (Signed)
Progress Note   Patient: Gary [name]:[id number] DOB: 01-Oct-1955 DOA: 09/26/2022     2 DOS: the patient was seen and examined on 09/28/2022   Brief hospital course: Gary [name] is a 67 y.o. male with medical history significant of Gary [name]  is a 67 y.o. 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 Recent admision on 09/07/2022 for BRBPR  s/p EGD and colonscopy. S.p. augmentin for divertifulitis.  Patient was discharged on Sep 12, 2022 to [facility] inpatient rehab.  Pt presented to our ED on evening of 09/26/22, reported having just been discharged from rehab that day.  Reported a cough and thick mucus from his trach.  During EMS transport from rehab to home, he had sudden shortness of breath and severe coughing fit, so he was brought to the ED for evaluation.  Initially O2 sats stabilized on trach collar O2, but due to recurrent desaturations and severe hypercapnea, he required admission and placement in mechanical ventilation.   PCCM team is following for respiratory management.     Assessment and Plan: * Acute on chronic respiratory failure with hypoxia and hypercapnia ([name]) Possible CAP ABG 7.27 / 112 / 70 -- he declined to be placed on vent due to needing trach changed to cuffed. Repeat ABG unchanged 7.27 / 112 /62 but progressive hypoxemia. --PCCM following, appreciate assistance --Pt ultimately allowed cuffed trach to be placed & started on mechanical ventilation later this morning --pCO2 improved after few hours on vent (46) --Diuresis as needed  --Continue antibiotics for potential  pneumonia  Anticoagulated Chronic DVT Resume Eliquis  Acute on chronic diastolic (congestive) heart failure ([name]) CXR c/w pulmonary edema Given IV Lasix 80 mg in the ED Continue intermittent diuresis as BP and renal function tolerates Strict I/O's & daily weights  OSA (obstructive sleep apnea) Obesity hypoventilation syndrome Mechanical ventilation per PCCM  Chronic kidney disease, stage 3a ([name]) Renal function stable. Monitor BMP  Morbid obesity with BMI of 50.0-59.9, adult ([name]) Body mass index is 51.37 kg/m. Complicates overall care and prognosis.  Recommend lifestyle modifications including physical activity and diet for weight loss and overall long-term health.  Iron deficiency anemia Hbg stable. Monitor CBC.  Anemia of chronic disease Hbg stable. No signs of bleeding. Monitor CBC  Acute metabolic encephalopathy Due to hypercapnia. Mentation improving after placed on the vent.  Monitor closely. --Stat ABG if lethargic --Ventilation per PCCM  Pressure injury of skin POA. I agree with wound description as outlined.  Continue diligent wound care, offloading pressure frequently & monitor for s/sx's of infection. Pressure Injury 09/27/22 Buttocks Left;Medial;Upper Stage 2 -  Partial thickness loss of dermis presenting as a shallow open injury with a red, pink wound bed without slough. Left buttocks small stage 2 (Active)  09/27/22 1130  Location: Buttocks  Location Orientation: Left;Medial;Upper  Staging: Stage 2 -  Partial thickness loss of dermis presenting as a shallow open injury with a red, pink wound bed without slough.  Wound Description (Comments): Left buttocks small stage 2  Present on Admission: Yes           Subjective: Pt seen in ICU this AM, sedated  on ventilator.  No acute events reported.  RN at bedside reports when he was awake earlier this AM, he was very restless anxious, pulling at things.  He pulled out the internal suction line.  He now  appears comfortably sedated, intermittently moving his hands like grabbing things.   RN reports PCCM considering bronchoscopy to clean mucus from his airways.   Physical Exam: Vitals:   09/28/22 0800 09/28/22 1000 09/28/22 1100 09/28/22 1200  BP: 94/68 94/77 120/77 (!) 129/114  Pulse: 77 80 78 77  Resp:  (!) 30 (!) 23 (!) 26  Temp:      TempSrc:      SpO2: 92% 90% 99% 91%  Weight:      Height:       General exam:  sedated on vent, no acute distress, morbidly obese HEENT: trach present connected to vent, moist mucus membranes, hearing grossly normal  Respiratory system: diffuse rhonchi, on ventilator. Cardiovascular system: normal S1/S2, RRR, trace pedal edema.   Gastrointestinal system: soft, NT, ND, no HSM felt Central nervous system: exam limited with pt sedated on vent Extremities: moves all, trace BLE edema, normal tone Skin: dry, intact, normal temperature Psychiatry: pt sedated   Data Reviewed:  Notable labs --- Cl 93, BUN 33, bicarb 39, glucose 129, Ca 8.8, Hbg 8.6, procal 0.53 >> 0.27   Last ABG: 7.51 / 57 / 56   Family Communication: None  Disposition: Status is: Inpatient Remains inpatient appropriate because: severity of illness with severe hypercapnia and encephalopathy   Planned Discharge Destination: Home    Time spent: 65 minutes including time at bedside and in coordination of care  Author: Pennie Banter, DO 09/28/2022 2:27 PM  For on call review [email or web address].
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
## 2022-09-29 ENCOUNTER — Inpatient Hospital Stay: Payer: Medicare Other

## 2022-09-29 DIAGNOSIS — J9621 Acute and chronic respiratory failure with hypoxia: Secondary | ICD-10-CM | POA: Diagnosis not present

## 2022-09-29 DIAGNOSIS — J9622 Acute and chronic respiratory failure with hypercapnia: Secondary | ICD-10-CM | POA: Diagnosis not present

## 2022-09-29 LAB — CULTURE, BLOOD (ROUTINE X 2)
Culture: NO GROWTH
Special Requests: ADEQUATE

## 2022-09-29 LAB — CBC
HCT: 38.1 % — ABNORMAL LOW (ref 39.0–52.0)
Hemoglobin: 10.2 g/dL — ABNORMAL LOW (ref 13.0–17.0)
MCH: 21.1 pg — ABNORMAL LOW (ref 26.0–34.0)
MCHC: 26.8 g/dL — ABNORMAL LOW (ref 30.0–36.0)
MCV: 78.7 fL — ABNORMAL LOW (ref 80.0–100.0)
Platelets: 224 10*3/uL (ref 150–400)
RBC: 4.84 MIL/uL (ref 4.22–5.81)
RDW: 18.4 % — ABNORMAL HIGH (ref 11.5–15.5)
WBC: 8 10*3/uL (ref 4.0–10.5)
nRBC: 0.2 % (ref 0.0–0.2)

## 2022-09-29 LAB — RENAL FUNCTION PANEL
Albumin: 3.2 g/dL — ABNORMAL LOW (ref 3.5–5.0)
Anion gap: 10 (ref 5–15)
BUN: 38 mg/dL — ABNORMAL HIGH (ref 8–23)
CO2: 34 mmol/L — ABNORMAL HIGH (ref 22–32)
Calcium: 8.9 mg/dL (ref 8.9–10.3)
Chloride: 97 mmol/L — ABNORMAL LOW (ref 98–111)
Creatinine, Ser: 1.4 mg/dL — ABNORMAL HIGH (ref 0.61–1.24)
GFR, Estimated: 56 mL/min — ABNORMAL LOW (ref >60)
Glucose, Bld: 140 mg/dL — ABNORMAL HIGH (ref 70–99)
Phosphorus: 4.3 mg/dL (ref 2.5–4.6)
Potassium: 3.6 mmol/L (ref 3.5–5.1)
Sodium: 141 mmol/L (ref 135–145)

## 2022-09-29 LAB — GLUCOSE, CAPILLARY
Glucose-Capillary: 139 mg/dL — ABNORMAL HIGH (ref 70–99)
Glucose-Capillary: 129 mg/dL — ABNORMAL HIGH (ref 70–99)
Glucose-Capillary: 116 mg/dL — ABNORMAL HIGH (ref 70–99)
Glucose-Capillary: 122 mg/dL — ABNORMAL HIGH (ref 70–99)
Glucose-Capillary: 119 mg/dL — ABNORMAL HIGH (ref 70–99)
Glucose-Capillary: 83 mg/dL (ref 70–99)

## 2022-09-29 LAB — PROCALCITONIN: Procalcitonin: 0.1 ng/mL

## 2022-09-29 LAB — CULTURE, BAL-QUANTITATIVE W GRAM STAIN: Culture: >=100000 — AB

## 2022-09-29 LAB — MAGNESIUM: Magnesium: 2.8 mg/dL — ABNORMAL HIGH (ref 1.7–2.4)

## 2022-09-29 MED ORDER — IPRATROPIUM-ALBUTEROL 0.5-2.5 (3) MG/3ML IN SOLN
3.0000 mL | Freq: Four times a day (QID) | RESPIRATORY_TRACT | Status: DC
  Administered 2022-09-29 – 2022-10-05 (×25): 3 mL via RESPIRATORY_TRACT
  Filled 2022-09-29 (×25): qty 3

## 2022-09-29 MED ORDER — PIPERACILLIN-TAZOBACTAM 3.375 G IVPB
3.3750 g | Freq: Three times a day (TID) | INTRAVENOUS | Status: DC
  Administered 2022-09-29 – 2022-09-30 (×3): 3.375 g via INTRAVENOUS
  Filled 2022-09-29 (×3): qty 50

## 2022-09-29 MED ORDER — SODIUM CHLORIDE 0.9 % IV SOLN
2.0000 g | INTRAVENOUS | Status: DC
  Administered 2022-09-29: 2 g via INTRAVENOUS
  Filled 2022-09-29: qty 20

## 2022-09-29 MED ORDER — HYDRALAZINE HCL 20 MG/ML IJ SOLN: 5.0000 mg | Freq: Four times a day (QID) | INTRAMUSCULAR | Status: DC | PRN

## 2022-09-29 MED ORDER — ENOXAPARIN SODIUM 100 MG/ML IJ SOSY
0.5000 mg/kg | PREFILLED_SYRINGE | INTRAMUSCULAR | Status: DC
  Administered 2022-09-29: 90 mg via SUBCUTANEOUS
  Filled 2022-09-29: qty 1
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
## 2022-09-29 NOTE — Progress Notes (Signed)
CSW contacted [employer] and spoke with [facility] about patient getting a vent to discharge home. CSW was told she would receive a call back if they are able to assist.

## 2022-09-29 NOTE — Progress Notes (Signed)
Per patient's girlfriend, [name], the home vent company picked up the vent from the rehab facility. It is no longer at his home.

## 2022-09-29 NOTE — Progress Notes (Signed)
Have spoken with [name], regarding reaching out to homecare vent company so as to get home vent for patient to use in hospital in anticipation of transitioning out of ICU, and ultimately to home. Dr. Valentina Lucks also updated.

## 2022-09-29 NOTE — Progress Notes (Signed)
Progress Note   Patient: Gary [name] ZOX:[id number] DOB: 11-15-1955 DOA: 09/26/2022     3 DOS: the patient was seen and examined on 09/29/2022   Brief hospital course: [name] is a 67 y.o. male with medical history significant of Gary [name]  is a 67 y.o. 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 Recent admision on 09/07/2022 for BRBPR  s/p EGD and colonscopy. S.p. augmentin for divertifulitis.  Patient was discharged on Sep 12, 2022 to [facility] inpatient rehab.  Pt presented to our ED on evening of 09/26/22, reported having just been discharged from rehab that day.  Reported a cough and thick mucus from his trach.  During EMS transport from rehab to home, he had sudden shortness of breath and severe coughing fit, so he was brought to the ED for evaluation.  Initially O2 sats stabilized on trach collar O2, but due to recurrent desaturations and severe hypercapnea, he required admission and placement in mechanical ventilation.   PCCM team is following for respiratory management.     Assessment and Plan: * Acute on chronic respiratory failure with hypoxia and hypercapnia ([name]) Possible CAP Chronic tracheostomy Nocturnal ventilation dependent OSA/OHS ABG 7.27 / 112 / 70 -- he declined to be placed on vent due to needing trach changed to cuffed. Repeat ABG unchanged 7.27 / 112 /62 but progressive hypoxemia. --PCCM following, appreciate assistance --Pt ultimately allowed cuffed trach to be placed & started on mechanical ventilation later this morning --pCO2 improved after few hours on vent  (46) --Diuresis as needed  --Continue antibiotics for potential pneumonia  Anticoagulated Chronic DVT Resume Eliquis  Acute on chronic diastolic (congestive) heart failure ([name]) CXR c/w pulmonary edema Given IV Lasix 80 mg in the ED Continue intermittent diuresis as BP and renal function tolerates Strict I/O's & daily weights  OSA (obstructive sleep apnea) Obesity hypoventilation syndrome Mechanical ventilation per PCCM  Chronic kidney disease, stage 3a ([name]) Renal function stable. Monitor BMP  Morbid obesity with BMI of 50.0-59.9, adult ([name]) Body mass index is 51.37 kg/m. Complicates overall care and prognosis.  Recommend lifestyle modifications including physical activity and diet for weight loss and overall long-term health.  Iron deficiency anemia Hbg stable. Monitor CBC.  Anemia of chronic disease Hbg stable. No signs of bleeding. Monitor CBC  Acute metabolic encephalopathy Due to hypercapnia. Mentation improving after placed on the vent.  Monitor closely. --Stat ABG if lethargic --Ventilation per PCCM  Pressure injury of skin POA. I agree with wound description as outlined.  Continue diligent wound care, offloading pressure frequently & monitor for s/sx's of infection. Pressure Injury 09/27/22 Buttocks Left;Medial;Upper Stage 2 -  Partial thickness loss of dermis presenting as a shallow open injury with a red, pink wound bed without slough. Left buttocks small stage 2 (Active)  09/27/22 1130  Location: Buttocks  Location Orientation: Left;Medial;Upper  Staging: Stage 2 -  Partial thickness loss of dermis presenting as a shallow open injury with a red, pink wound bed without slough.  Wound Description (Comments): Left buttocks small stage 2  Present on Admission: Yes           Subjective: Pt  seen in ICU this AM, remains on ventilator but awake when seen this AM.  Nods yes that his breathing feels better after bronchoscopy yesterday.  No acute complaints  or events reported.     Physical Exam: Vitals:   09/29/22 0900 09/29/22 0930 09/29/22 1030 09/29/22 1200  BP: (!) 151/91 (!) 158/95 (!) 146/89 (!) 156/97  Pulse: 62 61 61 (!) 58  Resp: (!) 21 (!) 23 (!) 32 (!) 25  Temp:      TempSrc:      SpO2: 95% 94% 91% 94%  Weight:      Height:       General exam:  awake on vent, no acute distress, morbidly obese HEENT: trach present connected to vent, moist mucus membranes, hearing grossly normal  Respiratory system: lungs clear, on ventilator, no wheezes or rhonchi, diminished bases. Cardiovascular system: normal S1/S2, RRR, trace pedal edema.   Gastrointestinal system: soft, NT, ND, no HSM felt Central nervous system: fgrossly non-focal exam, follows commands, non-verbal on vent Extremities: moves all, trace BLE edema, normal tone Skin: dry, intact, normal temperature Psychiatry: normal mood, flat affect   Data Reviewed:  Notable labs --- Cl 97, bicarb 34, glucose 140, BUN 38, Cr 1.40 stable, Mg 2.8, albumin 3.2.   Procal 0.27 >> 0.10 Hbg 10.2 from 8.6.  Last ABG (6/14 AM): 7.51 / 57 / 56  Micro: Blood cultures 6/13 -- aerobic bottle with GPR's - pending BAL cultures 6/14 -- moderate GNR's, rare GPR's BAL AFB smear -- pending BAL acid fast cx -- pending   Family Communication: None  Disposition: Status is: Inpatient Remains inpatient appropriate because: severity of illness, remains on ventilator in ICU   Planned Discharge Destination: Home    Time spent: 35 minutes  Author: Pennie Banter, DO 09/29/2022 1:30 PM  For on call review [email or web address].

## 2022-09-29 NOTE — Progress Notes (Signed)
PHARMACY - PHYSICIAN COMMUNICATION CRITICAL VALUE ALERT - BLOOD CULTURE IDENTIFICATION (BCID)  Results for orders placed or performed during the hospital encounter of 09/26/22  SARS Coronavirus 2 by RT PCR (hospital order, performed in [facility] hospital lab) *cepheid single result test* Anterior Nasal Swab     Status: None   Collection Time: 09/27/22 12:39 AM   Specimen: Anterior Nasal Swab  Result Value Ref Range Status   SARS Coronavirus 2 by RT PCR NEGATIVE NEGATIVE Final    Comment: (NOTE) SARS-CoV-2 target nucleic acids are NOT DETECTED.  The SARS-CoV-2 RNA is generally detectable in upper and lower respiratory specimens during the acute phase of infection. The lowest concentration of SARS-CoV-2 viral copies this assay can detect is 250 copies / mL. A negative result does not preclude SARS-CoV-2 infection and should not be used as the sole basis for treatment or other patient management decisions.  A negative result may occur with improper specimen collection / handling, submission of specimen other than nasopharyngeal swab, presence of viral mutation(s) within the areas targeted by this assay, and inadequate number of viral copies (<250 copies / mL). A negative result must be combined with clinical observations, patient history, and epidemiological information.  Fact Sheet for Patients:   RoadLapTop.co.za  Fact Sheet for Healthcare Providers: http://kim-miller.com/  This test is not yet approved or  cleared by the [place] FDA and has been authorized for detection and/or diagnosis of SARS-CoV-2 by FDA under an Emergency Use Authorization (EUA).  This EUA will remain in effect (meaning this test can be used) for the duration of the COVID-19 declaration under Section 564(b)(1) of the Act, 21 U.S.C. section 360bbb-3(b)(1), unless the authorization is terminated or revoked sooner.  Performed at [facility], [address] [place], [place] [address]   Culture, blood (Routine X 2) w Reflex to ID Panel     Status: None (Preliminary result)   Collection Time: 09/27/22 12:39 AM   Specimen: BLOOD  Result Value Ref Range Status   Specimen Description BLOOD  RIGHT ARM  Final   Special Requests   Final    BOTTLES DRAWN AEROBIC AND ANAEROBIC Blood Culture adequate volume   Culture  Setup Time   Final    GRAM POSITIVE RODS AEROBIC BOTTLE ONLY CRITICAL RESULT CALLED TO, READ BACK BY AND VERIFIED WITH: Shacara Cozine AT 6045 09/29/22.PMF Performed at [facility], [address] [place], [place] [address]    Culture GRAM POSITIVE RODS  Final   Report Status PENDING  Incomplete  Culture, blood (Routine X 2) w Reflex to ID Panel     Status: None (Preliminary result)   Collection Time: 09/27/22 12:39 AM   Specimen: BLOOD  Result Value Ref Range Status   Specimen Description BLOOD  RIGHT HAND  Final   Special Requests   Final    BOTTLES DRAWN AEROBIC AND ANAEROBIC Blood Culture adequate volume   Culture   Final    NO GROWTH 1 DAY Performed at [facility], [address] [place], [place] [address]    Report Status PENDING  Incomplete  MRSA Next Gen by PCR, Nasal     Status: None   Collection Time: 09/27/22  1:45 PM   Specimen: Nasal Mucosa; Nasal Swab  Result Value Ref Range Status   MRSA by PCR Next Gen NOT DETECTED NOT DETECTED Final    Comment: (NOTE) The GeneXpert MRSA Assay (FDA approved for NASAL specimens only), is one component of a comprehensive  MRSA colonization surveillance program. It is not intended to diagnose MRSA infection nor to guide or monitor treatment for MRSA infections. Test performance is not FDA approved in patients less than 73 years old. Performed at [facility], [address] [place], [place] [address]   Respiratory (~20 pathogens) panel by PCR     Status: None   Collection Time: 09/27/22  6:18 PM   Specimen: Nasopharyngeal Swab   Result Value Ref Range Status   Adenovirus NOT DETECTED NOT DETECTED Final   Coronavirus 229E NOT DETECTED NOT DETECTED Final    Comment: (NOTE) The Coronavirus on the Respiratory Panel, DOES NOT test for the novel  Coronavirus (2019 nCoV)    Coronavirus HKU1 NOT DETECTED NOT DETECTED Final   Coronavirus NL63 NOT DETECTED NOT DETECTED Final   Coronavirus OC43 NOT DETECTED NOT DETECTED Final   Metapneumovirus NOT DETECTED NOT DETECTED Final   Rhinovirus / Enterovirus NOT DETECTED NOT DETECTED Final   Influenza A NOT DETECTED NOT DETECTED Final   Influenza B NOT DETECTED NOT DETECTED Final   Parainfluenza Virus 1 NOT DETECTED NOT DETECTED Final   Parainfluenza Virus 2 NOT DETECTED NOT DETECTED Final   Parainfluenza Virus 3 NOT DETECTED NOT DETECTED Final   Parainfluenza Virus 4 NOT DETECTED NOT DETECTED Final   Respiratory Syncytial Virus NOT DETECTED NOT DETECTED Final   Bordetella pertussis NOT DETECTED NOT DETECTED Final   Bordetella Parapertussis NOT DETECTED NOT DETECTED Final   Chlamydophila pneumoniae NOT DETECTED NOT DETECTED Final   Mycoplasma pneumoniae NOT DETECTED NOT DETECTED Final    Comment: Performed at [facility], [address] [place], [place] [address]  Culture, BAL-quantitative w Gram Stain     Status: None (Preliminary result)   Collection Time: 09/28/22  4:36 PM   Specimen: Bronchoalveolar Lavage; Respiratory  Result Value Ref Range Status   Specimen Description   Final    BRONCHIAL ALVEOLAR LAVAGE Performed at [facility], [address], [place], [place] [address]    Special Requests   Final    NONE Performed at [facility], [address] [place], [place] [address]    Gram Stain   Final    MODERATE WBC PRESENT, PREDOMINANTLY PMN MODERATE GRAM NEGATIVE RODS RARE GRAM POSITIVE RODS Performed at [facility], [address] [place], [place] [address]    Culture PENDING  Incomplete   Report Status PENDING   Incomplete    BCID Results: 4 of 4 bottles w/ GNRs. Lab sending bottles to [facility]. Pt currently on Ceftriaxone. WBC WNL and pt remains afebrile.     Name of provider contacted: Cliffton Asters, NP   Changes to prescribed antibiotics required: No changes at this time, pending F/U assessment by incoming provider.  Otelia Sergeant, PharmD, [facility] 09/29/2022 5:21 AM

## 2022-09-29 NOTE — Progress Notes (Signed)
CSW contacted [employer] back about an update on the vent. Edwyna Ready stated she sent out an email to that department but was unsure if they operate on the weekends. Edwyna Ready stated as soon as she hears something she will call back.

## 2022-09-29 NOTE — Progress Notes (Signed)
PHARMACY CONSULT NOTE  Pharmacy Consult for Electrolyte Monitoring and Replacement   Recent Labs: Potassium (mmol/L)  Date Value  09/29/2022 3.6  04/15/2014 3.9   Magnesium (mg/dL)  Date Value  16/01/9603 2.8 (H)  08/21/2012 1.9   Calcium (mg/dL)  Date Value  54/12/8117 8.9   Calcium, Total (mg/dL)  Date Value  14/78/2956 8.3 (L)   Albumin (g/dL)  Date Value  21/30/8657 3.2 (L)  04/15/2014 2.9 (L)   Phosphorus (mg/dL)  Date Value  84/69/6295 4.3   Sodium (mmol/L)  Date Value  09/29/2022 141  03/15/2020 145 (H)  04/15/2014 140   Assessment: 67 y.o. male w/ PMH of morbid obesity, trach collar dependent, with chronic respiratory failure due to OSA, COPD, hypertension, diastolic CHF as well as CKD and hypertension, with frequent prior hospitalizations, who presents with shortness of breath   Goal of Therapy:  Electrolytes within normal limits  Plan:  --No electrolyte replacement indicated at this time --Follow-up electrolytes as ordered by primary team  Tressie Ellis 09/29/2022 7:31 AM

## 2022-09-30 DIAGNOSIS — J9621 Acute and chronic respiratory failure with hypoxia: Secondary | ICD-10-CM | POA: Diagnosis not present

## 2022-09-30 DIAGNOSIS — J9622 Acute and chronic respiratory failure with hypercapnia: Secondary | ICD-10-CM | POA: Diagnosis not present

## 2022-09-30 LAB — CBC
HCT: 38.5 % — ABNORMAL LOW (ref 39.0–52.0)
Hemoglobin: 10.2 g/dL — ABNORMAL LOW (ref 13.0–17.0)
MCH: 20.9 pg — ABNORMAL LOW (ref 26.0–34.0)
MCHC: 26.5 g/dL — ABNORMAL LOW (ref 30.0–36.0)
MCV: 79.1 fL — ABNORMAL LOW (ref 80.0–100.0)
Platelets: 216 10*3/uL (ref 150–400)
RBC: 4.87 MIL/uL (ref 4.22–5.81)
RDW: 18.6 % — ABNORMAL HIGH (ref 11.5–15.5)
WBC: 9.7 10*3/uL (ref 4.0–10.5)
nRBC: 0 % (ref 0.0–0.2)

## 2022-09-30 LAB — RENAL FUNCTION PANEL
Albumin: 3.1 g/dL — ABNORMAL LOW (ref 3.5–5.0)
Anion gap: 12 (ref 5–15)
BUN: 57 mg/dL — ABNORMAL HIGH (ref 8–23)
CO2: 29 mmol/L (ref 22–32)
Calcium: 8.2 mg/dL — ABNORMAL LOW (ref 8.9–10.3)
Chloride: 97 mmol/L — ABNORMAL LOW (ref 98–111)
Creatinine, Ser: 1.85 mg/dL — ABNORMAL HIGH (ref 0.61–1.24)
GFR, Estimated: 40 mL/min — ABNORMAL LOW (ref >60)
Glucose, Bld: 91 mg/dL (ref 70–99)
Phosphorus: 3.2 mg/dL (ref 2.5–4.6)
Potassium: 3.8 mmol/L (ref 3.5–5.1)
Sodium: 138 mmol/L (ref 135–145)

## 2022-09-30 LAB — CULTURE, BLOOD (ROUTINE X 2)

## 2022-09-30 LAB — GLUCOSE, CAPILLARY
Glucose-Capillary: 113 mg/dL — ABNORMAL HIGH (ref 70–99)
Glucose-Capillary: 111 mg/dL — ABNORMAL HIGH (ref 70–99)

## 2022-09-30 LAB — CULTURE, BAL-QUANTITATIVE W GRAM STAIN

## 2022-09-30 MED ORDER — ENOXAPARIN SODIUM 80 MG/0.8ML IJ SOSY
0.5000 mg/kg | PREFILLED_SYRINGE | INTRAMUSCULAR | Status: DC
  Administered 2022-09-30 – 2022-10-01 (×2): 80 mg via SUBCUTANEOUS
  Filled 2022-09-30 (×2): qty 0.8

## 2022-09-30 MED ORDER — ACETYLCYSTEINE 20 % IN SOLN
1200.0000 mg | Freq: Two times a day (BID) | RESPIRATORY_TRACT | Status: DC
  Administered 2022-09-30 (×2): 1200 mg via ORAL
  Filled 2022-09-30 (×2): qty 4

## 2022-09-30 MED ORDER — GUAIFENESIN ER 600 MG PO TB12
600.0000 mg | ORAL_TABLET | Freq: Two times a day (BID) | ORAL | Status: DC
  Administered 2022-09-30 – 2022-10-08 (×17): 600 mg via ORAL
  Filled 2022-09-30 (×17): qty 1

## 2022-09-30 MED ORDER — ORAL CARE MOUTH RINSE
15.0000 mL | OROMUCOSAL | Status: DC
  Administered 2022-10-01 – 2022-10-07 (×20): 15 mL via OROMUCOSAL

## 2022-09-30 NOTE — Progress Notes (Signed)
Patient states he needs a Sizewise Bariatric bed.  Not the one he has gotten

## 2022-09-30 NOTE — Progress Notes (Signed)
Patient breathing a rate of 34 on ATC 40%, explained to patient the need to go back on ventilator.  Patient stated that he did not want to go on because he felt like he couldn't breath on ventilator due to secretions.  I explained we could suction him while on ventilator as we did before while he was on ventilator.  Patient still refused stated he wanted to eat breakfast first then he would go on ventilator later on.

## 2022-09-30 NOTE — Progress Notes (Signed)
Patient requested to be taken off because he states he has too many secretions. RN and Anna Genre, NP has been made aware that patient is back on trach collar
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
## 2022-09-30 NOTE — Evaluation (Signed)
Physical Therapy Evaluation Patient Details Name: Gary [name] MRN: [id number] DOB: [birth year] Today's Date: 09/30/2022  History of Present Illness  Pt is a 67 y/o M admitted on 09/26/22 for tx of acute on chronic respiratory failure with hypoxia & hypercapnia, possible CAP. PMH: morbid obesity, chronic trach/trach collar dependent, trilogy vent at night, lower GI bleed, chronic hypoxic & hypercapnic respiratory failure due to OSA, OHS, COPD, HTN, dyslipidemia, chronic  Clinical Impression  Pt seen for PT evaluation with pt agreeable, received sitting EOB. Pt reports he was recently in rehab & walking down the hall with RW but got sick & hasn't walked for the past 3 weeks. Pt is able to demonstrate 3/5 BLE knee extension sitting EOB. Provided pt with RW as pt initially willing to transfer STS. One set up with RW, with EOB elevated, pt states he cannot do it today; pt appears more anxious & requesting PT bring another person for tomorrow's attempt. Pt requires min<>mod assist for sit>supine & is able to reposition himself in bed with use of hospital bed features. Will continue to follow pt acutely to address strengthening, balance & endurance to increase independence with mobility.       Recommendations for follow up therapy are one component of a multi-disciplinary discharge planning process, led by the attending physician.  Recommendations may be updated based on patient status, additional functional criteria and insurance authorization.  Follow Up Recommendations       Assistance Recommended at Discharge Frequent or constant Supervision/Assistance  Patient can return home with the following  Assistance with cooking/housework;Assist for transportation;A lot of help with walking and/or transfers;A lot of help with bathing/dressing/bathroom;Help with stairs or ramp for entrance    Equipment Recommendations Other (comment) (TBD)  Recommendations for Other Services       Functional Status  Assessment Patient has had a recent decline in their functional status and demonstrates the ability to make significant improvements in function in a reasonable and predictable amount of time.     Precautions / Restrictions Precautions Precautions: Fall Precaution Comments: trach/vent Restrictions Weight Bearing Restrictions: No      Mobility  Bed Mobility Overal bed mobility: Needs Assistance Bed Mobility: Sit to Supine       Sit to supine: Min assist, Mod assist (assistance to elevate BLE onto bed)   General bed mobility comments: is able to scoot to [facility] with bed in trendelenburg position & use of bed rails    Transfers                   General transfer comment: Pt agreeable to attempt STS so obtained bari RW & provided set up. Increased height of EOB but then pt stating he's unable to try; appears more anxious than anything.    Ambulation/Gait                  Stairs            Wheelchair Mobility    Modified Rankin (Stroke Patients Only)       Balance Overall balance assessment: Needs assistance Sitting-balance support: Feet supported, Bilateral upper extremity supported Sitting balance-[name] Scale: Fair Sitting balance - Comments: supervision static sitting                                     Pertinent Vitals/Pain Pain Assessment Pain Assessment: No/denies pain    Home Living  Additional Comments: Pt reports he has an aide that can assist him 7am-11pm 7 days/week. Pt reports he has an apartment with single step to enter.    Prior Function               Mobility Comments: Pt reports he was recently in rehab & working on walking down the hallway with RW, about to trial [facility], however he got sick & has not walked in the past 3 weeks.       Hand Dominance        Extremity/Trunk Assessment   Upper Extremity Assessment Upper Extremity Assessment: Generalized weakness    Lower  Extremity Assessment Lower Extremity Assessment: Generalized weakness (BLE 3/5 knee extension in sitting)       Communication   Communication: Tracheostomy;Passy-Muir valve  Cognition Arousal/Alertness: Awake/alert Behavior During Therapy: WFL for tasks assessed/performed, Anxious Overall Cognitive Status: Within Functional Limits for tasks assessed                                          General Comments      Exercises     Assessment/Plan    PT Assessment Patient needs continued PT services  PT Problem List Decreased strength;Cardiopulmonary status limiting activity;Decreased activity tolerance;Decreased balance;Decreased mobility       PT Treatment Interventions DME instruction;Therapeutic exercise;Gait training;Balance training;Stair training;Neuromuscular re-education;Functional mobility training;Therapeutic activities;Patient/family education    PT Goals (Current goals can be found in the Care Plan section)  Acute Rehab PT Goals Patient Stated Goal: get better PT Goal Formulation: With patient Time For Goal Achievement: 10/14/22 Potential to Achieve Goals: Fair    Frequency Min 3X/week     Co-evaluation               AM-PAC PT "6 Clicks" Mobility  Outcome Measure Help needed turning from your back to your side while in a flat bed without using bedrails?: A Little Help needed moving from lying on your back to sitting on the side of a flat bed without using bedrails?: A Little Help needed moving to and from a bed to a chair (including a wheelchair)?: A Lot Help needed standing up from a chair using your arms (e.g., wheelchair or bedside chair)?: A Lot Help needed to walk in hospital room?: Total Help needed climbing 3-5 steps with a railing? : Total 6 Click Score: 12    End of Session   Activity Tolerance: Patient tolerated treatment well Patient left: in bed;with call bell/phone within reach Nurse Communication: Mobility status PT  Visit Diagnosis: Muscle weakness (generalized) (M62.81);Difficulty in walking, not elsewhere classified (R26.2)    Time: 5284-1324 PT Time Calculation (min) (ACUTE ONLY): 20 min   Charges:   PT Evaluation $PT Eval Moderate Complexity: 1 Mod          Aleda Grana, PT, DPT 09/30/22, 2:22 PM   Sandi Mariscal 09/30/2022, 2:20 PM

## 2022-09-30 NOTE — Progress Notes (Signed)
PHARMACY CONSULT NOTE  Pharmacy Consult for Electrolyte Monitoring and Replacement   Recent Labs: Potassium (mmol/L)  Date Value  09/30/2022 3.8  04/15/2014 3.9   Magnesium (mg/dL)  Date Value  16/01/9603 2.8 (H)  08/21/2012 1.9   Calcium (mg/dL)  Date Value  54/12/8117 8.2 (L)   Calcium, Total (mg/dL)  Date Value  14/78/2956 8.3 (L)   Albumin (g/dL)  Date Value  21/30/8657 3.1 (L)  04/15/2014 2.9 (L)   Phosphorus (mg/dL)  Date Value  84/69/6295 3.2   Sodium (mmol/L)  Date Value  09/30/2022 138  03/15/2020 145 (H)  04/15/2014 140   Assessment: 67 y.o. male w/ PMH of morbid obesity, trach collar dependent, with chronic respiratory failure due to OSA, COPD, hypertension, diastolic CHF as well as CKD and hypertension, with frequent prior hospitalizations, who presents with shortness of breath   Goal of Therapy:  Electrolytes within normal limits  Plan:  --No electrolyte replacement indicated at this time --Follow-up electrolytes as ordered by primary team  Katlin Bortner A 09/30/2022 7:24 AM

## 2022-09-30 NOTE — Progress Notes (Addendum)
Progress Note   Patient: Gary [name]:[id number] DOB: [birth year] DOA: 09/26/2022     4 DOS: the patient was seen and examined on 09/30/2022   Brief hospital course: Gary [name] is a 67 y.o. male with medical history significant of Gary [name]  is a 67 y.o. 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 Recent admision on 09/07/2022 for BRBPR  s/p EGD and colonscopy. S.p. augmentin for divertifulitis.  Patient was discharged on Sep 12, 2022 to [facility] inpatient rehab.  Pt presented to our ED on evening of 09/26/22, reported having just been discharged from rehab that day.  Reported a cough and thick mucus from his trach.  During EMS transport from rehab to home, he had sudden shortness of breath and severe coughing fit, so he was brought to the ED for evaluation.  Initially O2 sats stabilized on trach collar O2, but due to recurrent desaturations and severe hypercapnea, he required admission and placement in mechanical ventilation.   PCCM team is following for respiratory management.     Assessment and Plan: * Acute on chronic respiratory failure with hypoxia and hypercapnia ([name]) Pseudomonas colonization Possible CAP Chronic tracheostomy Nocturnal ventilation dependent OSA/OHS ABG's improved on vent. --PCCM following, appreciate assistance --s/p bronchoscopy --BAL cultures grew pseudomonas, felt to be colonization --Antibiotics stopped per PCCM --Diuresis as needed  --Monitor clinically off antibiotics --Scheduled mucinex added to thin secretions --Saline nebs --Ventilation  overnight --  Anticoagulated Chronic DVT Resume Eliquis  Acute on chronic diastolic (congestive) heart failure ([name]) CXR c/w pulmonary edema Given IV Lasix 80 mg in the ED Continue intermittent diuresis as BP and renal function tolerates Strict I/O's & daily weights  OSA (obstructive sleep apnea) Obesity hypoventilation syndrome Mechanical ventilation per PCCM  Chronic kidney disease, stage 3a ([name]) AKI - 6/16 Cr 1.40 >> 1.85 --Encourage PO hydration --Monitor BMP  Morbid obesity with BMI of 50.0-59.9, adult ([name]) Body mass index is 51.37 kg/m. Complicates overall care and prognosis.  Recommend lifestyle modifications including physical activity and diet for weight loss and overall long-term health.  Iron deficiency anemia Hbg stable. Monitor CBC.  Anemia of chronic disease Hbg stable. No signs of bleeding. Monitor CBC  Acute metabolic encephalopathy Due to hypercapnia. Mentation improving after placed on the vent.  Monitor closely. --Stat ABG if lethargic --Ventilation per PCCM  Pressure injury of skin POA. I agree with wound description as outlined.  Continue diligent wound care, offloading pressure frequently & monitor for s/sx's of infection. Pressure Injury 09/27/22 Buttocks Left;Medial;Upper Stage 2 -  Partial thickness loss of dermis presenting as a shallow open injury with a red, pink wound bed without slough. Left buttocks small stage 2 (Active)  09/27/22 1130  Location: Buttocks  Location Orientation: Left;Medial;Upper  Staging: Stage 2 -  Partial thickness loss of dermis presenting as a shallow open injury with a red, pink wound bed without slough.  Wound Description (Comments): Left buttocks small stage 2  Present on Admission: Yes           Subjective: Pt seen in ICU this AM, off the vent since yesterday. Only used a short time overnight, due to  thick secretions recurring.  He asks what can be done about his thick secretions.  Tolerating diet  well.  No other acute complaints.    Physical Exam: Vitals:   09/30/22 0900 09/30/22 1151 09/30/22 1152 09/30/22 1300  BP: 116/84   106/78  Pulse: 88 88 87 95  Resp:  (!) 26 (!) 25 (!) 41  Temp:  98.7 F (37.1 C)    TempSrc:  Oral    SpO2: 90% (!) 87% 97% 93%  Weight:      Height:       General exam:  awake on vent, no acute distress, morbidly obese HEENT: trach collar present, moist mucus membranes, hearing grossly normal  Respiratory system: coarse breath sounds ?referred upper airway secretions, on trach collar and using PMV, diminished bases. Cardiovascular system: normal S1/S2, RRR, trace pedal edema.   Gastrointestinal system: soft, NT, ND, no HSM felt Central nervous system: grossly non-focal exam, normal speech, A&Ox3 Extremities: moves all, trace BLE edema, normal tone Skin: dry, intact, normal temperature Psychiatry: normal mood, flat affect   Data Reviewed:  Notable labs --- Cl 97, BUN 38 >> 57, Cr 1.40 >> 1.84, albumin 3.1   Hbg 10.2 stable.    Micro: Blood cultures 6/13 -- aerobic bottle with GPR's - pending BAL cultures 6/14 -- moderate GNR's, rare GPR's BAL AFB smear -- pending BAL acid fast cx -- [name] resistant to ceftazidime & Zosyn. Sensitive to Cipro, Gentamicin and Imipenem.  This is felt to be chronic colonization - antibiotics stopped per PCCM.   Family Communication: None  Disposition: Status is: Inpatient Remains inpatient appropriate because: severity of illness, remains on IV therapies as above.    Planned Discharge Destination: Home    Patient is high risk of morbidity and mortality due to severe chronic co-morbid conditions, recurrent hypercapnic respiratory failure, morbid obesity with severe OSA/OHS  Time spent: 42 minutes  Author: Pennie Banter, DO 09/30/2022 2:11 PM  For on call review [email or web address].

## 2022-09-30 NOTE — Plan of Care (Signed)
Discussed with patient plan of care for the evening, pain management and movement in he bed with some teach back displayed.  What is important to the patient at this time is movement since we are waiting for his bariatric bed and chair.    Problem: Education: Goal: Knowledge of General Education information will improve Description: Including pain rating scale, medication(s)/side effects and non-pharmacologic comfort measures Outcome: Progressing   Problem: Health Behavior/Discharge Planning: Goal: Ability to manage health-related needs will improve Outcome: Progressing   Problem: Activity: Goal: Risk for activity intolerance will decrease Outcome: Progressing   Problem: Coping: Goal: Level of anxiety will decrease Outcome: Progressing

## 2022-10-01 DIAGNOSIS — J9621 Acute and chronic respiratory failure with hypoxia: Secondary | ICD-10-CM | POA: Diagnosis not present

## 2022-10-01 DIAGNOSIS — J9622 Acute and chronic respiratory failure with hypercapnia: Secondary | ICD-10-CM | POA: Diagnosis not present

## 2022-10-01 LAB — RENAL FUNCTION PANEL
Albumin: 3.1 g/dL — ABNORMAL LOW (ref 3.5–5.0)
Anion gap: 11 (ref 5–15)
BUN: 64 mg/dL — ABNORMAL HIGH (ref 8–23)
CO2: 29 mmol/L (ref 22–32)
Calcium: 8.1 mg/dL — ABNORMAL LOW (ref 8.9–10.3)
Chloride: 98 mmol/L (ref 98–111)
Creatinine, Ser: 1.8 mg/dL — ABNORMAL HIGH (ref 0.61–1.24)
GFR, Estimated: 41 mL/min — ABNORMAL LOW (ref >60)
Glucose, Bld: 125 mg/dL — ABNORMAL HIGH (ref 70–99)
Phosphorus: 4.1 mg/dL (ref 2.5–4.6)
Potassium: 3.8 mmol/L (ref 3.5–5.1)
Sodium: 138 mmol/L (ref 135–145)

## 2022-10-01 LAB — CBC
HCT: 37.2 % — ABNORMAL LOW (ref 39.0–52.0)
Hemoglobin: 9.9 g/dL — ABNORMAL LOW (ref 13.0–17.0)
MCH: 21 pg — ABNORMAL LOW (ref 26.0–34.0)
MCHC: 26.6 g/dL — ABNORMAL LOW (ref 30.0–36.0)
MCV: 79 fL — ABNORMAL LOW (ref 80.0–100.0)
Platelets: 204 10*3/uL (ref 150–400)
RBC: 4.71 MIL/uL (ref 4.22–5.81)
RDW: 18.5 % — ABNORMAL HIGH (ref 11.5–15.5)
WBC: 6 10*3/uL (ref 4.0–10.5)
nRBC: 0.3 % — ABNORMAL HIGH (ref 0.0–0.2)

## 2022-10-01 LAB — ACID FAST SMEAR (AFB, MYCOBACTERIA): Acid Fast Smear: NEGATIVE

## 2022-10-01 LAB — CULTURE, BLOOD (ROUTINE X 2)

## 2022-10-01 LAB — TRIGLYCERIDES: Triglycerides: 181 mg/dL — ABNORMAL HIGH (ref <150)

## 2022-10-01 MED ORDER — ACETYLCYSTEINE 20 % IN SOLN
1200.0000 mg | Freq: Two times a day (BID) | RESPIRATORY_TRACT | Status: DC
  Administered 2022-10-01: 800 mg via RESPIRATORY_TRACT
  Administered 2022-10-02 – 2022-10-03 (×3): 1200 mg via RESPIRATORY_TRACT
  Administered 2022-10-03: 800 mg via RESPIRATORY_TRACT
  Administered 2022-10-04 – 2022-10-05 (×3): 1200 mg via RESPIRATORY_TRACT
  Filled 2022-10-01: qty 8
  Filled 2022-10-01 (×3): qty 4
  Filled 2022-10-01 (×2): qty 8
  Filled 2022-10-01 (×2): qty 4
  Filled 2022-10-01 (×2): qty 8

## 2022-10-01 MED ORDER — CIPROFLOXACIN HCL 500 MG PO TABS
750.0000 mg | ORAL_TABLET | Freq: Two times a day (BID) | ORAL | Status: DC
  Administered 2022-10-01 – 2022-10-05 (×9): 750 mg
  Filled 2022-10-01 (×12): qty 1.5

## 2022-10-01 NOTE — Plan of Care (Signed)
Discussed with patient plan of care for the evening, pain management and stool medications with some teach back displayed.  What was important was important to the patient was that BP and SpO2 be taken every 4 hours; it was hurting his finger and arm.   Also, reviewed and explained he had received no stool medications for 5 days since he had 2 bowel movements last night and 4 today.  Problem: Education: Goal: Ability to describe self-care measures that may prevent or decrease complications (Diabetes Survival Skills Education) will improve Outcome: Not Progressing   Problem: Health Behavior/Discharge Planning: Goal: Ability to identify and utilize available resources and services will improve Outcome: Not Progressing   Problem: Pain Managment: Goal: General experience of comfort will improve Outcome: Progressing

## 2022-10-01 NOTE — Progress Notes (Signed)
Patient seen to place on vent for bedtime use.  Patient has declined at this time.  He is taking mucomyst po and is coughing up copious of secretions every few minutes he states.  He states he did speak to dr about it and why he wasn't using vent.  States he will attempt upcoming night if secretion management is better

## 2022-10-01 NOTE — Progress Notes (Signed)
Progress Note   Patient: Gary [name] IRJ:[id number] DOB: December 20, 1955 DOA: 09/26/2022     5 DOS: the patient was seen and examined on 10/01/2022   Brief hospital course: Gary [name] is a 67 y.o. male with medical history significant of Gary [name]  is a 67 y.o. 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 Recent admision on 09/07/2022 for BRBPR  s/p EGD and colonscopy. S.p. augmentin for divertifulitis.  Patient was discharged on Sep 12, 2022 to [facility] inpatient rehab.  Pt presented to our ED on evening of 09/26/22, reported having just been discharged from rehab that day.  Reported a cough and thick mucus from his trach.  During EMS transport from rehab to home, he had sudden shortness of breath and severe coughing fit, so he was brought to the ED for evaluation.  Initially O2 sats stabilized on trach collar O2, but due to recurrent desaturations and severe hypercapnea, he required admission and placement in mechanical ventilation.   PCCM team is following for respiratory management.     Assessment and Plan: * Acute on chronic respiratory failure with hypoxia and hypercapnia ([name]) Gary colonization Possible CAP Chronic tracheostomy Nocturnal ventilation dependent OSA/OHS ABG's improved on vent. --PCCM following, appreciate assistance --s/p bronchoscopy --BAL cultures grew Gary, felt to be colonization --Started on Cipro for BAL cx growing Gary and pt with copious thick secretions --Diuresis as needed  --Monitor clinically off antibiotics --Scheduled mucinex added to thin  secretions --Saline nebs --Ventilation overnight --HH agency to bring pt's home vent & then can transfer to floor  Anticoagulated Chronic DVT Resume Eliquis  Acute on chronic diastolic (congestive) heart failure ([name]) CXR c/w pulmonary edema Given IV Lasix 80 mg in the ED Continue intermittent diuresis as BP and renal function tolerates Strict I/O's & daily weights  OSA (obstructive sleep apnea) Obesity hypoventilation syndrome Mechanical ventilation per PCCM  Chronic kidney disease, stage 3a ([name]) AKI - 6/16 Cr 1.40 >> 1.85 --Encourage PO hydration --Monitor BMP  Morbid obesity with BMI of 50.0-59.9, adult ([name]) Body mass index is 51.37 kg/m. Complicates overall care and prognosis.  Recommend lifestyle modifications including physical activity and diet for weight loss and overall long-term health.  Iron deficiency anemia Hbg stable. Monitor CBC.  Anemia of chronic disease Hbg stable. No signs of bleeding. Monitor CBC  Acute metabolic encephalopathy Due to hypercapnia. Mentation improving after placed on the vent.  Monitor closely. --Stat ABG if lethargic --Ventilation per PCCM  Pressure injury of skin POA. I agree with wound description as outlined.  Continue diligent wound care, offloading pressure frequently & monitor for s/sx's of infection. Pressure Injury 09/27/22 Buttocks Left;Medial;Upper Stage 2 -  Partial thickness loss of dermis presenting as a shallow open injury with a red, pink wound bed without slough. Left buttocks small stage 2 (Active)  09/27/22 1130  Location: Buttocks  Location Orientation: Left;Medial;Upper  Staging: Stage 2 -  Partial thickness loss of dermis presenting as a shallow open injury with a red, pink wound bed without slough.  Wound Description (Comments): Left buttocks small stage 2  Present on Admission: Yes  Subjective: Pt seen in ICU this AM.  He asks for the bed he had here last time ("size wise") because the  current bed is killing his back and is not high enough for him to be able to come up to standing from sitting edge of bed.  Reports ongoing thick copious secretions.     Physical Exam: Vitals:   10/01/22 0700 10/01/22 0705 10/01/22 1200 10/01/22 1301  BP: 108/78  96/81   Pulse: 69 71    Resp: (!) 25 13 (!) 21   Temp:      TempSrc:      SpO2: 93% 97%  96%  Weight:      Height:       General exam:  awake on vent, no acute distress, morbidly obese HEENT: trach collar present, moist mucus membranes, hearing grossly normal  Respiratory system: coarse breath sounds ?referred upper airway secretions, on trach collar and using PMV, diminished bases. Cardiovascular system: normal S1/S2, RRR, trace pedal edema.   Gastrointestinal system: soft, NT, ND, no HSM felt Central nervous system: grossly non-focal exam, normal speech, A&Ox3 Extremities: moves all, trace BLE edema, normal tone Skin: dry, intact, normal temperature Psychiatry: normal mood, flat affect   Data Reviewed:  Notable labs --- BUN 64, Cr 1.40 >> 1.85 >> 1.80, albumin 3.1   Hbg 9.9 stable.    Micro: Blood cultures 6/13 -- aerobic bottle with GPR's - pending BAL cultures 6/14 -- moderate GNR's, rare GPR's BAL AFB smear -- pending BAL acid fast cx -- Gary [name] resistant to ceftazidime & Zosyn. Sensitive to Cipro, Gentamicin and Imipenem.  This is felt to be chronic colonization.   Family Communication: None  Disposition: Status is: Inpatient Remains inpatient appropriate because: severity of illness, remains on IV therapies as above.    Planned Discharge Destination: Home    Patient is high risk of morbidity and mortality due to severe chronic co-morbid conditions, recurrent hypercapnic respiratory failure, morbid obesity with severe OSA/OHS  Time spent: 36 minutes  Author: Pennie Banter, DO 10/01/2022 1:24 PM  For on call review [email or web address].

## 2022-10-01 NOTE — Progress Notes (Signed)
   10/01/22 1200  Spiritual Encounters  Type of Visit Initial  Care provided to: Patient  Conversation partners present during encounter Nurse  Referral source Nurse (RN/NT/LPN)  Reason for visit Routine spiritual support  OnCall Visit No  Spiritual Framework  Presenting Themes Values and beliefs;Courage hope and growth;Meaning/purpose/sources of inspiration  Community/Connection Family;Faith community;Friend(s)  Patient Stress Factors Health changes  Family Stress Factors None identified  Interventions  Spiritual Care Interventions Made Established relationship of care and support;Compassionate presence;Reflective listening;Prayer;Encouragement  Intervention Outcomes  Outcomes Connection to spiritual care;Awareness around self/spiritual resourses  Spiritual Care Plan  Spiritual Care Issues Still Outstanding Chaplain will continue to follow   Nurse asked the chaplain to visit with the patient. Chaplain went to meet with the patient and see how he is doing. Patient shared that he is doing a lot better today and shared with the chaplain that God has continued to spare his life. Patient also shared that he has children and six siblings and that he grew up going to church. Patient and chaplain talked about hope and things to look forward to. Chaplain prayed with the patient and told him she will be by this week to pay him another visit.
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
## 2022-10-01 NOTE — Progress Notes (Signed)
Trach care and suctioning performed. Patient tolerated interventions well. Remains on trach collar. Vitals good. Patient has declined vent use again tonight as he states secretions continue to be a problem and feels like he is choking once he is placed on vent. He states he talked with home health company today and his vent will be delivered tomorrow as he does better with his equipment vs hospital. Will continue to monitor

## 2022-10-01 NOTE — Progress Notes (Signed)
Physical Therapy Treatment Patient Details Name: Gary [name] MRN: [id number] DOB: 09/19/1955 Today's Date: 10/01/2022   History of Present Illness Pt is a 67 y/o M admitted on 09/26/22 for tx of acute on chronic respiratory failure with hypoxia & hypercapnia, possible CAP. PMH: morbid obesity, chronic trach/trach collar dependent, trilogy vent at night, lower GI bleed, chronic hypoxic & hypercapnic respiratory failure due to OSA, OHS, COPD, HTN, dyslipidemia, chronic    PT Comments    Patient is agreeable to PT. He was motivated to get out of bed and cooperative throughout session. 2 standing bouts performed. Cues for safety and increased eccentric control with transfers. Patient was able to take several steps from bed to chair with occasional assistance for rolling walker negotiation. Vitals stable throughout session. Standing tolerance limited for progression of further standing activity, however patient wants to try walking next session. Recommend to continue PT to maximize independence and facilitate return to prior level of function.    Recommendations for follow up therapy are one component of a multi-disciplinary discharge planning process, led by the attending physician.  Recommendations may be updated based on patient status, additional functional criteria and insurance authorization.  Follow Up Recommendations       Assistance Recommended at Discharge Frequent or constant Supervision/Assistance  Patient can return home with the following A lot of help with walking and/or transfers;A lot of help with bathing/dressing/bathroom;Assist for transportation;Help with stairs or ramp for entrance;Assistance with cooking/housework   Equipment Recommendations   (to be determined)    Recommendations for Other Services       Precautions / Restrictions Precautions Precautions: Fall Precaution Comments: trach/vent Restrictions Weight Bearing Restrictions: No     Mobility  Bed  Mobility Overal bed mobility: Needs Assistance Bed Mobility: Supine to Sit     Supine to sit: Supervision, HOB elevated     General bed mobility comments: increased time and effort. occasional cues for technique, task initiation    Transfers Overall transfer level: Needs assistance Equipment used: Rolling walker (2 wheels) (bariatric RW) Transfers: Sit to/from Stand, Bed to chair/wheelchair/BSC Sit to Stand: Min assist, From elevated surface, +2 physical assistance   Step pivot transfers: Mod assist       General transfer comment: verbal cues for technique. sit to stand from bed with seated rest break and then stand step performed from bed to chair using rolling walker. cues for increased eccentric control    Ambulation/Gait                   Stairs             Wheelchair Mobility    Modified Rankin (Stroke Patients Only)       Balance Overall balance assessment: Needs assistance Sitting-balance support: Feet supported Sitting balance-[name] Scale: Fair   Postural control: Posterior lean Standing balance support: Bilateral upper extremity supported Standing balance-[name] Scale: Poor Standing balance comment: cues and faciliation for anterior weight shifting initially. improved balance with the second stand                            Cognition Arousal/Alertness: Awake/alert Behavior During Therapy: WFL for tasks assessed/performed, Anxious Overall Cognitive Status: Within Functional Limits for tasks assessed  Exercises      General Comments General comments (skin integrity, edema, etc.): vitals stable throughout session. due to the low height of the baraitric chair, patient will need lift to get back to bed (lift pad in room)      Pertinent Vitals/Pain Pain Assessment Pain Assessment: No/denies pain    Home Living Family/patient expects to be discharged to:: Private  residence Living Arrangements: Spouse/significant other Available Help at Discharge: Family;Personal care attendant Type of Home: Apartment Home Access: Level entry;Ramped entrance       Home Layout: One level Home Equipment: Cane - single point;Wheelchair - Nurse, children's (2 wheels);Hospital bed      Prior Function            PT Goals (current goals can now be found in the care plan section) Acute Rehab PT Goals Patient Stated Goal: get better PT Goal Formulation: With patient Time For Goal Achievement: 10/14/22 Potential to Achieve Goals: Fair Progress towards PT goals: Progressing toward goals    Frequency    Min 3X/week      PT Plan Current plan remains appropriate    Co-evaluation PT/OT/SLP Co-Evaluation/Treatment: Yes Reason for Co-Treatment: Complexity of the patient's impairments (multi-system involvement);For patient/therapist safety;To address functional/ADL transfers PT goals addressed during session: Mobility/safety with mobility OT goals addressed during session: ADL's and self-care      AM-PAC PT "6 Clicks" Mobility   Outcome Measure  Help needed turning from your back to your side while in a flat bed without using bedrails?: A Little Help needed moving from lying on your back to sitting on the side of a flat bed without using bedrails?: A Little Help needed moving to and from a bed to a chair (including a wheelchair)?: A Lot Help needed standing up from a chair using your arms (e.g., wheelchair or bedside chair)?: A Lot Help needed to walk in hospital room?: A Lot Help needed climbing 3-5 steps with a railing? : Total 6 Click Score: 13    End of Session   Activity Tolerance: Patient tolerated treatment well Patient left: in chair;with call bell/phone within reach Nurse Communication: Mobility status;Need for lift equipment PT Visit Diagnosis: Muscle weakness (generalized) (M62.81);Difficulty in walking, not elsewhere classified  (R26.2)     Time: 9147-8295 PT Time Calculation (min) (ACUTE ONLY): 29 min  Charges:  $Therapeutic Activity: 23-37 mins                     Gary [name], PT, MPT    [name] 10/01/2022, 2:32 PM

## 2022-10-01 NOTE — Progress Notes (Signed)
Trach dressing changed. Patient remains on trach collar in no distress. Patient has stated home health company to bring his home unit in today and per his conversation with Dr Karna Christmas, he can be transferred to floor. Will continue to monitor.

## 2022-10-01 NOTE — Progress Notes (Signed)
PHARMACY CONSULT NOTE  Pharmacy Consult for Electrolyte Monitoring and Replacement   Recent Labs: Potassium (mmol/L)  Date Value  10/01/2022 3.8  04/15/2014 3.9   Magnesium (mg/dL)  Date Value  16/01/9603 2.8 (H)  08/21/2012 1.9   Calcium (mg/dL)  Date Value  54/12/8117 8.1 (L)   Calcium, Total (mg/dL)  Date Value  14/78/2956 8.3 (L)   Albumin (g/dL)  Date Value  21/30/8657 3.1 (L)  04/15/2014 2.9 (L)   Phosphorus (mg/dL)  Date Value  84/69/6295 4.1   Sodium (mmol/L)  Date Value  10/01/2022 138  03/15/2020 145 (H)  04/15/2014 140   Assessment: 67 y.o. male w/ PMH of morbid obesity, trach collar dependent, with chronic respiratory failure due to OSA, COPD, hypertension, diastolic CHF as well as CKD and hypertension, with frequent prior hospitalizations, who presents with shortness of breath   Goal of Therapy:  Electrolytes within normal limits  Plan:  --No electrolyte replacement indicated at this time --Follow-up electrolytes as ordered by primary team  [facility] A Ia Leeb 10/01/2022 1:03 PM

## 2022-10-01 NOTE — TOC Initial Note (Addendum)
Transition of Care [facility]) - Initial/Assessment Note    Patient Details  Name: Gary [name] MRN: [id number] Date of Birth: 1955-11-13  Transition of Care [facility]) CM/SW Contact:    [name], LCSW Phone Number: 10/01/2022, 11:18 AM  Clinical Narrative:                  Update: CSW spoke with patient who declined [facility] at this time, wishes to return home with [facility] and reports he also wants NIV delivered to bedside, Mitch with [employer] updated will order NIV. Rob with [name] reports they are actively working on staffing for him now and have completed paperwork to submit to [facility] and they just need estimated dc timeframe.      Patient admitted with SOB, patient pulling trach with O2 sats dropping. Patient has prominent hx of admissions.   CSW confirmed with Mitch with [employer] patient had NIV at home, they recently picked it up since patient went to [facility]. Mitch reports if patient goes to [facility] again, they would need to set up NIV at home after his stay at [facility].   Patient has hx of [facility], per [facility] with [facility] they would [facility] patient [facility] if he needed to return to them. Patient was admitted to [facility] from [facility] on 5/24, from that admission patient went to [facility] at [facility], [place] [phone], [place])   Patient was being transported from [facility] to his home on 6/12 when EMS as re routed to hospital.   [facility] will follow for appropriate discharge disposition.   Expected Discharge Plan:  (TBD) Barriers to Discharge: Continued Medical Work up   Patient Goals and CMS Choice            Expected Discharge Plan and Services                                              Prior Living Arrangements/Services                       Activities of Daily Living Home Assistive Devices/Equipment: Vent/Trach supplies ADL Screening (condition at time of  admission) Patient's cognitive ability adequate to safely complete daily activities?: Yes Is the patient deaf or have difficulty hearing?: No Does the patient have difficulty seeing, even when wearing glasses/contacts?: No Does the patient have difficulty concentrating, remembering, or making decisions?: No Patient able to express need for assistance with ADLs?: Yes Does the patient have difficulty dressing or bathing?: No Independently performs ADLs?: No Communication: Independent Dressing (OT): Needs assistance Is this a change from baseline?: Pre-admission baseline Grooming: Needs assistance Is this a change from baseline?: Pre-admission baseline Feeding: Independent Bathing: Needs assistance Is this a change from baseline?: Pre-admission baseline Toileting: Needs assistance Is this a change from baseline?: Pre-admission baseline In/Out Bed: Needs assistance Is this a change from baseline?: Pre-admission baseline Walks in Home: Needs assistance Is this a change from baseline?: Pre-admission baseline Does the patient have difficulty walking or climbing stairs?: Yes Weakness of Legs: Both Weakness of Arms/Hands: Both  Permission Sought/Granted                  Emotional Assessment              Admission diagnosis:  Acute respiratory failure ([name]) [J96.00]  Acute on chronic diastolic congestive heart failure ([name]) [I50.33] Acute on chronic respiratory failure with hypoxia ([name]) [J96.21] Acute on chronic respiratory failure with hypoxia and hypercapnia ([name]) [Z61.09, J96.22] Patient Active Problem List   Diagnosis Date Noted   Anemia of chronic disease 09/27/2022   Acute respiratory failure ([name]) 09/26/2022   Acute metabolic encephalopathy 09/26/2022   Chronic colitis 09/12/2022   Segmental colitis associated with diverticulosis ([name]) 09/12/2022   Diverticulosis of colon with hemorrhage 09/10/2022   Abnormal CT scan, gastrointestinal tract 09/10/2022   Anemia,  posthemorrhagic, acute 09/08/2022   Lower GI bleed 09/07/2022   Chest pain 08/13/2022   Acute on chronic heart failure with preserved ejection fraction (HFpEF) ([name]) 08/12/2022   Acute exacerbation of CHF (congestive heart failure) ([name]) 04/11/2022   Influenza A 04/11/2022   Hypokalemia 03/06/2022   Acute on chronic diastolic (congestive) heart failure ([name]) 02/03/2022   Chronic obstructive pulmonary disease (COPD) ([name]) 02/03/2022   GERD without esophagitis 02/03/2022   Anxiety and depression 02/03/2022   Dyslipidemia 02/03/2022   Hematochezia    Bloody stool 11/12/2021   Major depressive disorder, recurrent episode, moderate ([name]) 10/22/2021   Pressure injury of skin 09/27/2021   Acute on chronic respiratory failure with hypercapnia ([name]) 08/07/2021   COPD (chronic obstructive pulmonary disease) ([name])    HLD (hyperlipidemia)    Iron deficiency anemia    Chronic kidney disease, stage 3a ([name]) 07/22/2021   Acute on chronic respiratory failure with hypoxia and hypercapnia ([name]) 07/20/2021   Chronic obstructive pulmonary disease ([name])    Acute CHF (congestive heart failure) ([name]) 04/04/2021   Weakness    Acute respiratory failure with hypoxia and hypercapnia ([name]) 03/11/2021   (HFpEF) heart failure with preserved ejection fraction ([name])    Chronic respiratory failure with hypoxia ([name])    Hyperlipidemia 02/19/2021   Acute respiratory distress syndrome (ARDS) due to COVID-19 virus ([name])    Acute on chronic respiratory failure ([name]) 02/18/2021   Generalized osteoarthritis of multiple sites 10/31/2020   AKI (acute kidney injury) ([name]) 03/04/2020   Hyperkalemia 03/04/2020   COPD with acute exacerbation ([name]) 03/02/2020   Marijuana use 01/17/2020   Thrombocytopenia ([name]) 01/17/2020   Positive hepatitis C antibody test 01/17/2020   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 mass index (BMI) of 50.0 to 59.9 in adult [facility]) 01/17/2020   Osteoarthritis of glenohumeral joints  (Bilateral) 01/17/2020   Osteoarthritis of AC (acromioclavicular) joints (Bilateral) 01/17/2020   Polysubstance abuse ([name]) 08/25/2019   Toxic metabolic encephalopathy 08/25/2019   Chronic pain syndrome 07/14/2019   Anticoagulated 07/14/2019   DDD (degenerative disc disease), lumbosacral 07/14/2019   DDD (degenerative disc disease), cervical 07/14/2019   CHF (congestive heart failure) ([name]) 04/12/2017   Acute on chronic diastolic CHF (congestive heart failure) ([name]) 02/28/2015   OSA (obstructive sleep apnea) 02/28/2015   Acute on chronic diastolic congestive heart failure ([name])    Acute on chronic respiratory failure with hypoxia ([name])    Morbid obesity with BMI of 50.0-59.9, adult ([name]) 04/01/2014   HTN (hypertension) 04/01/2014   PCP:  [facility]:   [facility] [place] [place] - [address] [place] [address] Phone: [phone] Fax: [phone]     Social Determinants of Health (SDOH) Social History: SDOH Screenings   Food Insecurity: No Food Insecurity (09/07/2022)  Housing: Patient Declined (09/07/2022)  Transportation Needs: No Transportation Needs (09/12/2022)  Recent Concern: Transportation Needs - Unmet Transportation Needs (08/13/2022)  Utilities: Not  At Risk (08/13/2022)  Depression (PHQ2-9): Low Risk  (06/07/2021)  Financial Resource Strain: Medium Risk (05/03/2017)  Physical Activity: Insufficiently Active (05/03/2017)  Social Connections: Moderately Integrated (05/03/2017)  Stress: No Stress Concern Present (05/03/2017)  Tobacco Use: Medium Risk (09/27/2022)   SDOH Interventions:     Readmission Risk Interventions    12/31/2021    8:58 AM 12/20/2021    4:21 PM 05/04/2021    1:26 PM  Readmission Risk Prevention Plan  Transportation Screening Complete Complete Complete  Medication Review Oceanographer) Complete Complete Complete  PCP or Specialist appointment within 3-5 days of discharge Complete  Complete Complete  HRI or Home Care Consult Complete Complete Complete  SW Recovery Care/Counseling Consult Complete Complete Complete  Palliative Care Screening Complete Complete Not Applicable  Skilled Nursing Facility Not Applicable Not Applicable Not Applicable

## 2022-10-01 NOTE — Evaluation (Signed)
Occupational Therapy Evaluation Patient Details Name: Gary [name] MRN: [id number] DOB: [birth year] Today's Date: 10/01/2022   History of Present Illness Pt is a 67 y/o M admitted on 09/26/22 for tx of acute on chronic respiratory failure with hypoxia & hypercapnia, possible CAP. PMH: morbid obesity, chronic trach/trach collar dependent, trilogy vent at night, lower GI bleed, chronic hypoxic & hypercapnic respiratory failure due to OSA, OHS, COPD, HTN, dyslipidemia, chronic   Clinical Impression   Mr Morenz was seen for OT evaluation this date. Prior to hospital admission, pt was recently at [facility], ambulatory using RW. Pt currently requires SUPERVISION exit bed and good sitting balance. MAX A don B shoes seated EOB. MIN A + RW sit>stand from elevated bed height and side steps bed>chair; +2 safety and lines mgmt. MOD A stand>sit to low chair height. Pt would benefit from skilled OT to address noted impairments and functional limitations (see below for any additional details). Upon hospital discharge, recommend follow up OT.   Recommendations for follow up therapy are one component of a multi-disciplinary discharge planning process, led by the attending physician.  Recommendations may be updated based on patient status, additional functional criteria and insurance authorization.   Assistance Recommended at Discharge Frequent or constant Supervision/Assistance  Patient can return home with the following A lot of help with walking and/or transfers;A lot of help with bathing/dressing/bathroom;Help with stairs or ramp for entrance    Functional Status Assessment  Patient has had a recent decline in their functional status and demonstrates the ability to make significant improvements in function in a reasonable and predictable amount of time.  Equipment Recommendations  Hospital bed    Recommendations for Other Services       Precautions / Restrictions Precautions Precautions: Fall Precaution  Comments: trach/vent Restrictions Weight Bearing Restrictions: No      Mobility Bed Mobility Overal bed mobility: Needs Assistance Bed Mobility: Supine to Sit     Supine to sit: Supervision     General bed mobility comments: use of rails    Transfers Overall transfer level: Needs assistance Equipment used: Rolling walker (2 wheels) Transfers: Sit to/from Stand, Bed to chair/wheelchair/BSC Sit to Stand: Min assist, From elevated surface, +2 physical assistance     Step pivot transfers: Min assist     General transfer comment: assist to stabilize RW. MOD A stand>sit to low chair height      Balance Overall balance assessment: Needs assistance Sitting-balance support: Feet supported Sitting balance-[name] Scale: Good     Standing balance support: Bilateral upper extremity supported Standing balance-[name] Scale: Fair                             ADL either performed or assessed with clinical judgement   ADL Overall ADL's : Needs assistance/impaired                                       General ADL Comments: MAX A don B shoes seated EOB. MIN A + RW for simulated BSC t/f      Pertinent Vitals/Pain Pain Assessment Pain Assessment: No/denies pain     Hand Dominance     Extremity/Trunk Assessment Upper Extremity Assessment Upper Extremity Assessment: Overall WFL for tasks assessed   Lower Extremity Assessment Lower Extremity Assessment: Generalized weakness       Communication Communication Communication: Tracheostomy;Passy-Muir valve  Cognition Arousal/Alertness: Awake/alert Behavior During Therapy: WFL for tasks assessed/performed, Anxious Overall Cognitive Status: Within Functional Limits for tasks assessed                                           Home Living Family/patient expects to be discharged to:: Private residence Living Arrangements: Spouse/significant other Available Help at Discharge:  Family;Personal care attendant Type of Home: Apartment Home Access: Level entry;Ramped entrance     Home Layout: One level     Bathroom Shower/Tub: Chief Strategy Officer: Handicapped height     Home Equipment: Cane - single point;Wheelchair - Nurse, children's (2 wheels);Hospital bed          Prior Functioning/Environment Prior Level of Function : Needs assist             Mobility Comments: Pt reports he was recently in rehab & working on walking down the hallway with RW, about to trial [facility], however he got sick & has not walked in the past 2 weeks. ADLs Comments: assist for toileting        OT Problem List: Decreased strength;Decreased range of motion;Decreased activity tolerance;Impaired balance (sitting and/or standing);Decreased safety awareness      OT Treatment/Interventions: Self-care/ADL training;Therapeutic exercise;Energy conservation;DME and/or AE instruction;Therapeutic activities;Patient/family education;Balance training    OT Goals(Current goals can be found in the care plan section) Acute Rehab OT Goals Patient Stated Goal: to walk OT Goal Formulation: With patient Time For Goal Achievement: 10/15/22 Potential to Achieve Goals: Good ADL Goals Pt Will Perform Grooming: with min guard assist;standing Pt Will Perform Lower Body Dressing: with min assist;sit to/from stand;with adaptive equipment Pt Will Transfer to Toilet: with modified independence;ambulating;regular height toilet  OT Frequency: Min 1X/week    Co-evaluation PT/OT/SLP Co-Evaluation/Treatment: Yes Reason for Co-Treatment: Complexity of the patient's impairments (multi-system involvement);For patient/therapist safety;To address functional/ADL transfers PT goals addressed during session: Mobility/safety with mobility OT goals addressed during session: ADL's and self-care      AM-PAC OT "6 Clicks" Daily Activity     Outcome Measure Help from another person eating  meals?: None Help from another person taking care of personal grooming?: None Help from another person toileting, which includes using toliet, bedpan, or urinal?: A Lot Help from another person bathing (including washing, rinsing, drying)?: A Lot Help from another person to put on and taking off regular upper body clothing?: A Little Help from another person to put on and taking off regular lower body clothing?: A Lot 6 Click Score: 17   End of Session Nurse Communication: Need for lift equipment  Activity Tolerance: Patient tolerated treatment well Patient left: in chair;with call bell/phone within reach  OT Visit Diagnosis: Unsteadiness on feet (R26.81);Muscle weakness (generalized) (M62.81)                Time: 1610-9604 OT Time Calculation (min): 28 min Charges:  OT General Charges $OT Visit: 1 Visit OT Evaluation $OT Eval Moderate Complexity: 1 Mod  Kathie Dike, M.S. OTR/L  10/01/22, 2:40 PM  ascom [phone]

## 2022-10-02 DIAGNOSIS — J9622 Acute and chronic respiratory failure with hypercapnia: Secondary | ICD-10-CM | POA: Diagnosis not present

## 2022-10-02 DIAGNOSIS — J9621 Acute and chronic respiratory failure with hypoxia: Secondary | ICD-10-CM | POA: Diagnosis not present

## 2022-10-02 LAB — RENAL FUNCTION PANEL
Albumin: 2.9 g/dL — ABNORMAL LOW (ref 3.5–5.0)
Anion gap: 7 (ref 5–15)
BUN: 59 mg/dL — ABNORMAL HIGH (ref 8–23)
CO2: 29 mmol/L (ref 22–32)
Calcium: 7.8 mg/dL — ABNORMAL LOW (ref 8.9–10.3)
Chloride: 103 mmol/L (ref 98–111)
Creatinine, Ser: 1.56 mg/dL — ABNORMAL HIGH (ref 0.61–1.24)
GFR, Estimated: 49 mL/min — ABNORMAL LOW (ref >60)
Glucose, Bld: 174 mg/dL — ABNORMAL HIGH (ref 70–99)
Phosphorus: 4 mg/dL (ref 2.5–4.6)
Potassium: 3.8 mmol/L (ref 3.5–5.1)
Sodium: 139 mmol/L (ref 135–145)

## 2022-10-02 LAB — CBC
HCT: 35.9 % — ABNORMAL LOW (ref 39.0–52.0)
Hemoglobin: 9.5 g/dL — ABNORMAL LOW (ref 13.0–17.0)
MCH: 21.3 pg — ABNORMAL LOW (ref 26.0–34.0)
MCHC: 26.5 g/dL — ABNORMAL LOW (ref 30.0–36.0)
MCV: 80.5 fL (ref 80.0–100.0)
Platelets: 201 10*3/uL (ref 150–400)
RBC: 4.46 MIL/uL (ref 4.22–5.81)
RDW: 18.5 % — ABNORMAL HIGH (ref 11.5–15.5)
WBC: 6.8 10*3/uL (ref 4.0–10.5)
nRBC: 0 % (ref 0.0–0.2)

## 2022-10-02 LAB — CULTURE, BLOOD (ROUTINE X 2): Special Requests: ADEQUATE

## 2022-10-02 MED ORDER — APIXABAN 5 MG PO TABS
5.0000 mg | ORAL_TABLET | Freq: Two times a day (BID) | ORAL | Status: DC
  Administered 2022-10-02 – 2022-10-05 (×7): 5 mg via ORAL
  Filled 2022-10-02 (×7): qty 1

## 2022-10-02 NOTE — Plan of Care (Signed)
Discussed with patient plan of care for the evening, pain management and medications with some teach back displayed.  What was important to the patient to discuss possible medications making him have several soft stool bowel movements.  Problem: Education: Goal: Ability to describe self-care measures that may prevent or decrease complications (Diabetes Survival Skills Education) will improve Outcome: Progressing

## 2022-10-02 NOTE — Progress Notes (Signed)
Occupational Therapy Treatment Patient Details Name: Gary [name] MRN: [id number] DOB: 1956/03/21 Today's Date: 10/02/2022   History of present illness Pt is a 67 y/o M admitted on 09/26/22 for tx of acute on chronic respiratory failure with hypoxia & hypercapnia, possible CAP. PMH: morbid obesity, chronic trach/trach collar dependent, trilogy vent at night, lower GI bleed, chronic hypoxic & hypercapnic respiratory failure due to OSA, OHS, COPD, HTN, dyslipidemia, chronic   OT comments  Gary [name] was seen for OT treatment on this date. Upon arrival to room pt reclined, agreeable to tx. Pt requires no physical assist for bed mobility, increased time and bed controls sup<>sit, good sitting balance. MAX A don B shoes seated EOB. CGA + RW sit<>stand from max elevated bed height and tolerated ~20 ft mobility. Pt with good attitude and insight into strengths. Pt making good progress toward goals, will continue to follow POC. Discharge recommendation remains appropriate.     Recommendations for follow up therapy are one component of a multi-disciplinary discharge planning process, led by the attending physician.  Recommendations may be updated based on patient status, additional functional criteria and insurance authorization.    Assistance Recommended at Discharge Frequent or constant Supervision/Assistance  Patient can return home with the following  A lot of help with walking and/or transfers;A lot of help with bathing/dressing/bathroom;Help with stairs or ramp for entrance   Equipment Recommendations  Hospital bed    Recommendations for Other Services      Precautions / Restrictions Precautions Precautions: Fall Precaution Comments: trach/vent Restrictions Weight Bearing Restrictions: No       Mobility Bed Mobility Overal bed mobility: Needs Assistance Bed Mobility: Supine to Sit, Sit to Supine     Supine to sit: Supervision Sit to supine: Supervision         Transfers Overall transfer level: Needs assistance Equipment used: Rolling walker (2 wheels) Transfers: Sit to/from Stand Sit to Stand: Min guard, From elevated surface                 Balance Overall balance assessment: Needs assistance Sitting-balance support: Feet supported Sitting balance-[name] Scale: Good     Standing balance support: Bilateral upper extremity supported Standing balance-[name] Scale: Fair                             ADL either performed or assessed with clinical judgement   ADL Overall ADL's : Needs assistance/impaired                                       General ADL Comments: MAX A don B shoes seated EOB. CGA + RW for simulated toilet t/f      Cognition Arousal/Alertness: Awake/alert Behavior During Therapy: WFL for tasks assessed/performed, Anxious Overall Cognitive Status: Within Functional Limits for tasks assessed                                           Pertinent Vitals/ Pain       Pain Assessment Pain Assessment: No/denies pain   Frequency  Min 1X/week        Progress Toward Goals  OT Goals(current goals can now be found in the care plan section)  Progress towards OT goals: Progressing toward goals  Acute Rehab  OT Goals Patient Stated Goal: to walk OT Goal Formulation: With patient Time For Goal Achievement: 10/15/22 Potential to Achieve Goals: Good ADL Goals Pt Will Perform Grooming: with min guard assist;standing Pt Will Perform Lower Body Dressing: with min assist;sit to/from stand;with adaptive equipment Pt Will Transfer to Toilet: with modified independence;ambulating;regular height toilet  Plan Discharge plan remains appropriate;Frequency remains appropriate    Co-evaluation                 AM-PAC OT "6 Clicks" Daily Activity     Outcome Measure   Help from another person eating meals?: None Help from another person taking care of personal grooming?:  None Help from another person toileting, which includes using toliet, bedpan, or urinal?: A Lot Help from another person bathing (including washing, rinsing, drying)?: A Lot Help from another person to put on and taking off regular upper body clothing?: A Little Help from another person to put on and taking off regular lower body clothing?: A Lot 6 Click Score: 17    End of Session Equipment Utilized During Treatment: Rolling walker (2 wheels)  OT Visit Diagnosis: Unsteadiness on feet (R26.81);Muscle weakness (generalized) (M62.81)   Activity Tolerance Patient tolerated treatment well   Patient Left in bed;with call bell/phone within reach   Nurse Communication          Time: 1610-9604 OT Time Calculation (min): 19 min  Charges: OT General Charges $OT Visit: 1 Visit OT Treatments $Self Care/Home Management : 8-22 mins  Kathie Dike, M.S. OTR/L  10/02/22, 2:35 PM  ascom [phone]

## 2022-10-02 NOTE — Progress Notes (Signed)
Progress Note   Patient: Gary [name]:[id number] DOB: October 26, 1955 DOA: 09/26/2022     6 DOS: the patient was seen and examined on 10/02/2022   Brief hospital course: Gary [name] is a 67 y.o. male with medical history significant of Gary [name]  is a 67 y.o. 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 Recent admision on 09/07/2022 for BRBPR  s/p EGD and colonscopy. S.p. augmentin for divertifulitis.  Patient was discharged on Sep 12, 2022 to [facility] inpatient rehab.  Pt presented to our ED on evening of 09/26/22, reported having just been discharged from rehab that day.  Reported a cough and thick mucus from his trach.  During EMS transport from rehab to home, he had sudden shortness of breath and severe coughing fit, so he was brought to the ED for evaluation.  Initially O2 sats stabilized on trach collar O2, but due to recurrent desaturations and severe hypercapnea, he required admission and placement in mechanical ventilation.   PCCM team is following for respiratory management.     Assessment and Plan: * Acute on chronic respiratory failure with hypoxia and hypercapnia ([name]) Pseudomonas colonization Possible CAP Chronic tracheostomy Nocturnal ventilation dependent OSA/OHS ABG's improved on vent. --PCCM following, appreciate assistance --s/p bronchoscopy --BAL cultures grew pseudomonas, felt to be colonization --Started on Cipro for BAL cx growing Pseudomonas and pt with copious thick secretions --Diuresis as needed  --Monitor clinically off antibiotics --Scheduled mucinex added to thin  secretions --Saline nebs --Ventilation overnight --HH agency to bring pt's home vent & then can transfer to floor  Anticoagulated Chronic DVT Resume Eliquis  Acute on chronic diastolic (congestive) heart failure ([name]) CXR c/w pulmonary edema Given IV Lasix 80 mg in the ED Continue intermittent diuresis as BP and renal function tolerates Strict I/O's & daily weights  OSA (obstructive sleep apnea) Obesity hypoventilation syndrome Mechanical ventilation per PCCM  Chronic kidney disease, stage 3a ([name]) AKI - 6/16 Cr 1.40 >> 1.85 --Encourage PO hydration --Monitor BMP  Morbid obesity with BMI of 50.0-59.9, adult ([name]) Body mass index is 51.37 kg/m. Complicates overall care and prognosis.  Recommend lifestyle modifications including physical activity and diet for weight loss and overall long-term health.  Iron deficiency anemia Hbg stable. Monitor CBC.  Anemia of chronic disease Hbg stable. No signs of bleeding. Monitor CBC  Acute metabolic encephalopathy Due to hypercapnia. Mentation improving after placed on the vent.  Monitor closely. --Stat ABG if lethargic --Ventilation per PCCM  Pressure injury of skin POA. I agree with wound description as outlined.  Continue diligent wound care, offloading pressure frequently & monitor for s/sx's of infection. Pressure Injury 09/27/22 Buttocks Left;Medial;Upper Stage 2 -  Partial thickness loss of dermis presenting as a shallow open injury with a red, pink wound bed without slough. Left buttocks small stage 2 (Active)  09/27/22 1130  Location: Buttocks  Location Orientation: Left;Medial;Upper  Staging: Stage 2 -  Partial thickness loss of dermis presenting as a shallow open injury with a red, pink wound bed without slough.  Wound Description (Comments): Left buttocks small stage 2  Present on Admission: Yes  Subjective: Pt seen in ICU this AM.  He reports still having quite a lot of secretions.  Otherwise doing  okay.  Asking for the bed he had last time, which I'm told is no longer being used, new models have replaced what he had last time.  He did well with therapy yesterday.  Still waiting on DME company to deliver his home NIV/vent.   Physical Exam: Vitals:   10/02/22 0254 10/02/22 0300 10/02/22 0600 10/02/22 0916  BP:    105/64  Pulse:      Resp: 20 19 (!) 22 20  Temp: 97.9 F (36.6 C)   98 F (36.7 C)  TempSrc: Oral   Oral  SpO2:    95%  Weight:      Height:       General exam:  awake on vent, no acute distress, morbidly obese HEENT: trach collar present, moist mucus membranes, hearing grossly normal  Respiratory system: clear with upper airway secretion sounds, on trach collar and using PMV Cardiovascular system: normal S1/S2, RRR, trace pedal edema.   Gastrointestinal system: soft, NT, ND, no HSM felt Central nervous system: grossly non-focal exam, normal speech, A&Ox3 Extremities: moves all, trace BLE edema, normal tone Skin: dry, intact, normal temperature Psychiatry: normal mood, flat affect   Data Reviewed:  Notable labs --- BUN 64, Cr 1.40 >> 1.85 >> 1.80, albumin 3.1   Hbg 9.9 stable.    Micro: Blood cultures 6/13 -- aerobic bottle with GPR's - pending BAL cultures 6/14 -- moderate GNR's, rare GPR's BAL AFB smear -- negative BAL acid fast cx -- [name] resistant to ceftazidime & Zosyn. Sensitive to Cipro, Gentamicin and Imipenem.     Family Communication: None  Disposition: Status is: Inpatient Remains inpatient appropriate because: requires home NIV/vent to be delivered.  Ongoing PT/OT evaluations to determine disposition -- Home with HH vs LTAC which pt is declining    Planned Discharge Destination: Home    Patient is high risk of morbidity and mortality due to severe chronic co-morbid conditions, recurrent hypercapnic respiratory failure, morbid obesity with severe OSA/OHS  Time spent: 35 minutes  Author: Pennie Banter, DO 10/02/2022  2:02 PM  For on call review [email or web address].

## 2022-10-02 NOTE — Progress Notes (Signed)
Patient continues to refuse hospital ventilator

## 2022-10-03 DIAGNOSIS — J9621 Acute and chronic respiratory failure with hypoxia: Secondary | ICD-10-CM | POA: Diagnosis not present

## 2022-10-03 DIAGNOSIS — J9622 Acute and chronic respiratory failure with hypercapnia: Secondary | ICD-10-CM | POA: Diagnosis not present

## 2022-10-03 LAB — RENAL FUNCTION PANEL
Albumin: 3.1 g/dL — ABNORMAL LOW (ref 3.5–5.0)
Anion gap: 5 (ref 5–15)
BUN: 44 mg/dL — ABNORMAL HIGH (ref 8–23)
CO2: 29 mmol/L (ref 22–32)
Calcium: 7.6 mg/dL — ABNORMAL LOW (ref 8.9–10.3)
Chloride: 108 mmol/L (ref 98–111)
Creatinine, Ser: 1.16 mg/dL (ref 0.61–1.24)
GFR, Estimated: >60 mL/min (ref >60)
Glucose, Bld: 137 mg/dL — ABNORMAL HIGH (ref 70–99)
Phosphorus: 3.8 mg/dL (ref 2.5–4.6)
Potassium: 4.2 mmol/L (ref 3.5–5.1)
Sodium: 142 mmol/L (ref 135–145)

## 2022-10-03 LAB — CBC
HCT: 38.4 % — ABNORMAL LOW (ref 39.0–52.0)
Hemoglobin: 9.7 g/dL — ABNORMAL LOW (ref 13.0–17.0)
MCH: 20.6 pg — ABNORMAL LOW (ref 26.0–34.0)
MCHC: 25.3 g/dL — ABNORMAL LOW (ref 30.0–36.0)
MCV: 81.7 fL (ref 80.0–100.0)
Platelets: 213 10*3/uL (ref 150–400)
RBC: 4.7 MIL/uL (ref 4.22–5.81)
RDW: 18.5 % — ABNORMAL HIGH (ref 11.5–15.5)
WBC: 7.9 10*3/uL (ref 4.0–10.5)
nRBC: 0 % (ref 0.0–0.2)

## 2022-10-03 LAB — MISC LABCORP TEST (SEND OUT): Labcorp test code: 8664

## 2022-10-03 LAB — CULTURE, BLOOD (SINGLE): Culture: NO GROWTH

## 2022-10-03 MED ORDER — FUROSEMIDE 40 MG PO TABS
40.0000 mg | ORAL_TABLET | Freq: Two times a day (BID) | ORAL | Status: DC
  Administered 2022-10-03 – 2022-10-08 (×9): 40 mg via ORAL
  Filled 2022-10-03 (×9): qty 1

## 2022-10-03 NOTE — Progress Notes (Signed)
PT Cancellation Note  Patient Details Name: Gary [name] MRN: [id number] DOB: [birth year]   Cancelled Treatment:    PT attempt. Pt states he is motivated to get moving again. Requested 1 pm treatment time. Author will return at 1pm as requested.     [name] 10/03/2022, 8:05 AM

## 2022-10-03 NOTE — Progress Notes (Signed)
Progress Note   Patient: Gary [name] ZOX:[id number] DOB: Jul 25, 1955 DOA: 09/26/2022     7 DOS: the patient was seen and examined on 10/03/2022      Subjective:  Patient seen in the ICU today Denies nausea vomiting He tells me his respiratory function is improving Currently on tracheostomy He has since noninvasive ventilator present at bedside I have discussed with case manager and patient's family will come for training He tells me he wants to be discharged home rather than going to a facility PT OT on board and we will see how patient will do  Brief hospital course: Gary [name] is a 67 y.o. male with medical history significant of Gary [name]  is a 67 y.o. 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 Recent admision on 09/07/2022 for BRBPR  s/p EGD and colonscopy. S.p. augmentin for divertifulitis.  Patient was discharged on Sep 12, 2022 to [facility] inpatient rehab.   Pt presented to our ED on evening of 09/26/22, reported having just been discharged from rehab that day.  Reported a cough and thick mucus from his trach.  During EMS transport from rehab to home, he had sudden shortness of breath and severe coughing fit, so he was brought to the ED for evaluation.  Initially O2 sats stabilized on trach collar O2, but due to recurrent desaturations and severe hypercapnea, he required admission and placement in mechanical ventilation.    PCCM team is following for respiratory management.        Assessment and Plan: * Acute on chronic respiratory failure with hypoxia and hypercapnia ([name]) Pseudomonas  colonization Possible CAP Chronic tracheostomy Nocturnal ventilation dependent OSA/OHS We will move patient from the ICU to progressive unit today We will continue to monitor respiratory function Patient is status post bronchoscopy with bronchial alveolar lavage showing Pseudomonas which is felt to be colonization Patient on ciprofloxacin on account of this We will continue diuresis as needed Continue scheduled Mucinex to thin secretions Nebulization as needed   Anticoagulated Chronic DVT Continue Eliquis   Acute on chronic diastolic (congestive) heart failure ([name]) CXR c/w pulmonary edema Given IV Lasix 80 mg in the ED Continue oral Lasix Strict I/O's & daily weights   OSA (obstructive sleep apnea) Obesity hypoventilation syndrome Continue NIV   AKI on chronic kidney disease, stage 3a ([name]) Renal function normalized today -- Patient requesting for his Lasix Dose have been resumed   Morbid obesity with BMI of 50.0-59.9, adult ([name]) Body mass index is 51.37 kg/m. Complicates overall care and prognosis.  Recommend lifestyle modifications including physical activity and diet for weight loss and overall long-term health.   Iron deficiency anemia Hbg stable. Monitor CBC.   Anemia of chronic disease Hbg stable. No signs of bleeding. Monitor CBC   Acute metabolic encephalopathy Due to hypercapnia. Mentation improving after placed on the vent.  Monitor closely. --Stat ABG if lethargic -- Continue on NIV   Pressure injury of skin POA. I agree with wound description as outlined.  Continue diligent wound care, offloading pressure frequently & monitor for s/sx's of infection. Pressure Injury 09/27/22 Buttocks Left;Medial;Upper Stage 2 -  Partial thickness loss  of dermis presenting as a shallow open injury with a red, pink wound bed without slough. Left buttocks small stage 2 (Active)  09/27/22 1130  Location: Buttocks  Location Orientation: Left;Medial;Upper  Staging:  Stage 2 -  Partial thickness loss of dermis presenting as a shallow open injury with a red, pink wound bed without slough.  Wound Description (Comments): Left buttocks small stage 2  Present on Admission: Yes        Physical Exam: General exam:  awake on vent, no acute distress, morbidly obese HEENT: Trach collar in place, hearing grossly normal  Respiratory system: Transmitted sounds Cardiovascular system: normal S1/S2, RRR trace bilateral lower extremity edema Gastrointestinal system: soft, NT, ND, no HSM felt Central nervous system: grossly non-focal exam, normal speech, A&Ox3 Extremities: moves all, trace BLE edema, normal tone Skin: dry, intact, normal temperature Psychiatry: normal mood, flat affect     Data Reviewed: Labs reviewed sodium 142 potassium 4.2 creatinine 1.1   Family Communication: None   Disposition: Status is: Inpatient Patient continues to work with PT OT According to PT patient should be able to go home with home health services once patient's family able to come for training for NIV and if patient is able to stand pivot from 1 surface to the other   Planned Discharge Destination: Home with home health services Patient has been discharged from this hospital to facility within the last 2 years. Patient has been a frequent and usually signs himself out of the facility. He tells me however I do want to be discharged home rather than to facility  Patient is high risk of morbidity and mortality due to severe chronic co-morbid conditions, recurrent hypercapnic respiratory failure, morbid obesity with severe OSA/OHS   Time spent: 42 minutes  : Vitals:   10/03/22 1000 10/03/22 1100 10/03/22 1200 10/03/22 1300  BP:   (!) 108/55   Pulse: 78 76 75 76  Resp: 19 (!) 25 (!) 23 (!) 25  Temp:   98 F (36.7 C)   TempSrc:   Oral   SpO2: 96% 97% 97% 97%  Weight:      Height:        Author: Loyce Dys, MD 10/03/2022 2:21 PM  For on call review  [email or web address].

## 2022-10-03 NOTE — Progress Notes (Signed)
Pt to be transferred to [id number]. Report given to Lloyd Huger, [place]. All belongings sent with pt.

## 2022-10-03 NOTE — Progress Notes (Signed)
Physical Therapy Treatment Patient Details Name: Gary [name] MRN: [id number] DOB: Feb 16, 1956 Today's Date: 10/03/2022   History of Present Illness Pt is a 67 y/o M admitted on 09/26/22 for tx of acute on chronic respiratory failure with hypoxia & hypercapnia, possible CAP. PMH: morbid obesity, chronic trach/trach collar dependent, trilogy vent at night, lower GI bleed, chronic hypoxic & hypercapnic respiratory failure due to OSA, OHS, COPD, HTN, dyslipidemia, chronic    PT Comments    Pt was long sitting in bed upon arrival. He is A and O and motivated to participate." I know I don't need to focus on walking but to focus on standing from lower surfaces." Author explained that pt will be to be able to stand form BSC/ and EOB at home to be safe. Pt was able to stand form elevated bed height 28 inch but only one time prior to requiring assistance to stand from 29-30 inch bed height. On third attempt in standing, pt easily and safely able to take steps to recliner. Hoyer pad is under pt to assist with returning to bed due to low surface height of recliner. Pt will benefit from continued skilled PT at DC to maximize his independence and safety with all ADLs.    Recommendations for follow up therapy are one component of a multi-disciplinary discharge planning process, led by the attending physician.  Recommendations may be updated based on patient status, additional functional criteria and insurance authorization.     Assistance Recommended at Discharge Frequent or constant Supervision/Assistance  Patient can return home with the following A lot of help with walking and/or transfers;A lot of help with bathing/dressing/bathroom;Assistance with cooking/housework;Assistance with feeding;Direct supervision/assist for medications management;Direct supervision/assist for financial management;Assist for transportation;Help with stairs or ramp for entrance   Equipment Recommendations  Other (comment) (Defer  to next level of care)       Precautions / Restrictions Precautions Precautions: Fall Precaution Comments: trach/vent (vent at night) Restrictions Weight Bearing Restrictions: No RLE Weight Bearing: Weight bearing as tolerated     Mobility  Bed Mobility Overal bed mobility: Modified Independent Bed Mobility: Supine to Sit, Sit to Supine  Supine to sit: Supervision Sit to supine: Supervision     Transfers Overall transfer level: Needs assistance Equipment used: Rolling walker (2 wheels) (Bariatric) Transfers: Sit to/from Stand Sit to Stand: Min guard, From elevated surface  General transfer comment: Pt stood 3 x EOB. Stood from 28inch 1 x with CGA but once fatigued required assistance + stood from slightly more elevated surface 29-30 inch. On 3rd STS pt took steps to [facility] with CGA.    Ambulation/Gait Ambulation/Gait assistance: Min guard Gait Distance (Feet): 5 Feet Assistive device: Rolling walker (2 wheels) (BRW) Gait Pattern/deviations: Step-to pattern Gait velocity: decreased  General Gait Details: Pt ambulated 5 ft from EOB > recliner    Balance Overall balance assessment: Needs assistance Sitting-balance support: Feet supported Sitting balance-[name] Scale: Good  Standing balance support: Bilateral upper extremity supported Standing balance-[name] Scale: Fair Standing balance comment: Reliant on RW/UE support      Cognition Arousal/Alertness: Awake/alert Behavior During Therapy: WFL for tasks assessed/performed, Anxious Overall Cognitive Status: Within Functional Limits for tasks assessed    General Comments: Pt is A and O x 4               Pertinent Vitals/Pain Pain Assessment Pain Assessment: No/denies pain Facial Expression: Relaxed, neutral Body Movements: Absence of movements Muscle Tension: Relaxed     PT Goals (current goals can  now be found in the care plan section) Acute Rehab PT Goals Patient Stated Goal: "Be able to stand from lower so I  can go home." Progress towards PT goals: Progressing toward goals    Frequency    Min 3X/week      PT Plan Current plan remains appropriate    Co-evaluation     PT goals addressed during session: Mobility/safety with mobility;Balance;Proper use of DME;Strengthening/ROM        AM-PAC PT "6 Clicks" Mobility   Outcome Measure  Help needed turning from your back to your side while in a flat bed without using bedrails?: A Little Help needed moving from lying on your back to sitting on the side of a flat bed without using bedrails?: A Little Help needed moving to and from a bed to a chair (including a wheelchair)?: A Lot Help needed standing up from a chair using your arms (e.g., wheelchair or bedside chair)?: A Lot Help needed to walk in hospital room?: A Lot Help needed climbing 3-5 steps with a railing? : Total 6 Click Score: 13    End of Session   Activity Tolerance: Patient tolerated treatment well Patient left: in chair;with call bell/phone within reach Nurse Communication: Mobility status PT Visit Diagnosis: Muscle weakness (generalized) (M62.81);Difficulty in walking, not elsewhere classified (R26.2)     Time: 4098-1191 PT Time Calculation (min) (ACUTE ONLY): 27 min  Charges:  $Therapeutic Exercise: 23-37 mins                     Jetta Lout PTA 10/03/22, 1:43 PM

## 2022-10-03 NOTE — TOC Progression Note (Addendum)
Transition of Care [facility]) - Progression Note    Patient Details  Name: Gary [name] MRN: [id number] Date of Birth: Jun 16, 1955  Transition of Care [facility]) CM/SW Contact  Margarito Liner, LCSW Phone Number: 10/03/2022, 1:29 PM  Clinical Narrative: Per [employer] liaison, vent set up is complete. Left voicemail for [facility] Duty Nursing liaison to see how soon they can start services.    2:08 pm: Received call back from Rob with [facility] PDN. He is waiting on significant other, [name], to fill out and return [id number] form which will allow her to be one of his caregivers. Lag time to start services will be around 5 business days if they get the form back on the day of discharge. They are also working on finding staff for him. They recommended that [name] come in for vent training since she will be his sole caregiver for several days. Sent secure chat to MD and RN to notify.   2:26 pm: Tried calling [name] but it was patient that answered the phone. He stated he does not feel like he is ready for discharge yet due to frequent suctioning and difficulty breathing. CSW asked him to have [name] come to the hospital for vent training but he said he got the same device this morning that he had in the past.  3:50 pm: Asked [employer] to check and see if patient still has hoyer lift. They said he does have standard wheelchair and hospital bed but they do not see that he has a bedside commode. Left voicemail with care coordinator through [employer] to check status of electric wheelchair: [name] [phone].   Expected Discharge Plan:  (TBD) Barriers to Discharge: Continued Medical Work up  Expected Discharge Plan and Services                                               Social Determinants of Health (SDOH) Interventions SDOH Screenings   Food Insecurity: No Food Insecurity (09/07/2022)  Housing: Patient Declined (09/07/2022)  Transportation Needs: No Transportation Needs (09/12/2022)  Recent  Concern: Transportation Needs - Unmet Transportation Needs (08/13/2022)  Utilities: Not At Risk (08/13/2022)  Depression (PHQ2-9): Low Risk  (06/07/2021)  Financial Resource Strain: Medium Risk (05/03/2017)  Physical Activity: Insufficiently Active (05/03/2017)  Social Connections: Moderately Integrated (05/03/2017)  Stress: No Stress Concern Present (05/03/2017)  Tobacco Use: Medium Risk (09/27/2022)    Readmission Risk Interventions    12/31/2021    8:58 AM 12/20/2021    4:21 PM 05/04/2021    1:26 PM  Readmission Risk Prevention Plan  Transportation Screening Complete Complete Complete  Medication Review Oceanographer) Complete Complete Complete  PCP or Specialist appointment within 3-5 days of discharge Complete Complete Complete  HRI or Home Care Consult Complete Complete Complete  SW Recovery Care/Counseling Consult Complete Complete Complete  Palliative Care Screening Complete Complete Not Applicable  Skilled Nursing Facility Not Applicable Not Applicable Not Applicable

## 2022-10-04 DIAGNOSIS — J9622 Acute and chronic respiratory failure with hypercapnia: Secondary | ICD-10-CM | POA: Diagnosis not present

## 2022-10-04 DIAGNOSIS — J9621 Acute and chronic respiratory failure with hypoxia: Secondary | ICD-10-CM | POA: Diagnosis not present

## 2022-10-04 LAB — CBC WITH DIFFERENTIAL/PLATELET
Abs Immature Granulocytes: 0.18 10*3/uL — ABNORMAL HIGH (ref 0.00–0.07)
Basophils Absolute: 0 10*3/uL (ref 0.0–0.1)
Basophils Relative: 0 %
Eosinophils Absolute: 0 10*3/uL (ref 0.0–0.5)
Eosinophils Relative: 0 %
HCT: 37.3 % — ABNORMAL LOW (ref 39.0–52.0)
Hemoglobin: 9.4 g/dL — ABNORMAL LOW (ref 13.0–17.0)
Immature Granulocytes: 2 %
Lymphocytes Relative: 4 %
Lymphs Abs: 0.4 10*3/uL — ABNORMAL LOW (ref 0.7–4.0)
MCH: 20.8 pg — ABNORMAL LOW (ref 26.0–34.0)
MCHC: 25.2 g/dL — ABNORMAL LOW (ref 30.0–36.0)
MCV: 82.5 fL (ref 80.0–100.0)
Monocytes Absolute: 0.6 10*3/uL (ref 0.1–1.0)
Monocytes Relative: 6 %
Neutro Abs: 9.2 10*3/uL — ABNORMAL HIGH (ref 1.7–7.7)
Neutrophils Relative %: 88 %
Platelets: 213 10*3/uL (ref 150–400)
RBC: 4.52 MIL/uL (ref 4.22–5.81)
RDW: 18.8 % — ABNORMAL HIGH (ref 11.5–15.5)
WBC: 10.4 10*3/uL (ref 4.0–10.5)
nRBC: 0 % (ref 0.0–0.2)

## 2022-10-04 LAB — BASIC METABOLIC PANEL
Anion gap: 3 — ABNORMAL LOW (ref 5–15)
BUN: 41 mg/dL — ABNORMAL HIGH (ref 8–23)
CO2: 31 mmol/L (ref 22–32)
Calcium: 7.5 mg/dL — ABNORMAL LOW (ref 8.9–10.3)
Chloride: 107 mmol/L (ref 98–111)
Creatinine, Ser: 1.16 mg/dL (ref 0.61–1.24)
GFR, Estimated: >60 mL/min (ref >60)
Glucose, Bld: 174 mg/dL — ABNORMAL HIGH (ref 70–99)
Potassium: 4.6 mmol/L (ref 3.5–5.1)
Sodium: 141 mmol/L (ref 135–145)

## 2022-10-04 LAB — TRIGLYCERIDES: Triglycerides: 86 mg/dL (ref <150)

## 2022-10-04 NOTE — Plan of Care (Signed)
Patient participating in goals of care to meet goals for discharge.  [name], RN    Problem: Education: Goal: Ability to describe self-care measures that may prevent or decrease complications (Diabetes Survival Skills Education) will improve Outcome: Progressing Goal: Individualized Educational Video(s) Outcome: Progressing   Problem: Coping: Goal: Ability to adjust to condition or change in health will improve Outcome: Progressing   Problem: Fluid Volume: Goal: Ability to maintain a balanced intake and output will improve Outcome: Progressing   Problem: Health Behavior/Discharge Planning: Goal: Ability to identify and utilize available resources and services will improve Outcome: Progressing Goal: Ability to manage health-related needs will improve Outcome: Progressing   Problem: Metabolic: Goal: Ability to maintain appropriate glucose levels will improve Outcome: Progressing   Problem: Nutritional: Goal: Maintenance of adequate nutrition will improve Outcome: Progressing Goal: Progress toward achieving an optimal weight will improve Outcome: Progressing   Problem: Skin Integrity: Goal: Risk for impaired skin integrity will decrease Outcome: Progressing   Problem: Tissue Perfusion: Goal: Adequacy of tissue perfusion will improve Outcome: Progressing   Problem: Education: Goal: Knowledge of General Education information will improve Description: Including pain rating scale, medication(s)/side effects and non-pharmacologic comfort measures Outcome: Progressing   Problem: Health Behavior/Discharge Planning: Goal: Ability to manage health-related needs will improve Outcome: Progressing   Problem: Clinical Measurements: Goal: Ability to maintain clinical measurements within normal limits will improve Outcome: Progressing Goal: Will remain free from infection Outcome: Progressing Goal: Diagnostic test results will improve Outcome: Progressing Goal:  Respiratory complications will improve Outcome: Progressing Goal: Cardiovascular complication will be avoided Outcome: Progressing   Problem: Activity: Goal: Risk for activity intolerance will decrease Outcome: Progressing   Problem: Nutrition: Goal: Adequate nutrition will be maintained Outcome: Progressing   Problem: Coping: Goal: Level of anxiety will decrease Outcome: Progressing   Problem: Elimination: Goal: Will not experience complications related to bowel motility Outcome: Progressing Goal: Will not experience complications related to urinary retention Outcome: Progressing   Problem: Pain Managment: Goal: General experience of comfort will improve Outcome: Progressing   Problem: Safety: Goal: Ability to remain free from injury will improve Outcome: Progressing   Problem: Skin Integrity: Goal: Risk for impaired skin integrity will decrease Outcome: Progressing   Problem: Safety: Goal: Non-violent Restraint(s) Outcome: Progressing

## 2022-10-04 NOTE — Progress Notes (Signed)
Patient got to the edge of bed by himself-patient is sitting on side of the bed eating dinner- reviewed safety interventions and precautions with patient- patient was not agreeable to fall and safety interventions- Charge RN aware

## 2022-10-04 NOTE — Progress Notes (Signed)
Patient became verbally abusive and demanding while primary RN was assisting patient with setting up dinner tray.  Community education officer
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
## 2022-10-04 NOTE — Progress Notes (Signed)
   10/04/22 1000  Spiritual Encounters  Type of Visit Initial  Care provided to: Patient  Reason for visit Routine spiritual support  OnCall Visit No    Chaplain came by to see patient for a follow up. Patient was taking a nap and thanked chaplain for coming by to see him. Chaplain said she will check in with him later when he is not napping.

## 2022-10-04 NOTE — Progress Notes (Signed)
Pt declined home vent. States he will have nurse call when he is ready for it

## 2022-10-04 NOTE — Progress Notes (Signed)
Progress Note   Patient: Gary [name]:[id number] DOB: December 13, 1955 DOA: 09/26/2022     8 DOS: the patient was seen and examined on 10/04/2022    Subjective:  Patient seen and examined Denies any overnight events Transition of care manager working on acquiring home devices before discharge   Brief hospital course: Gary [name] is a 67 y.o. male with medical history significant of Gary [name]  is a 67 y.o. 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 Recent admision on 09/07/2022 for BRBPR  s/p EGD and colonscopy. 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 augmentin for divertifulitis.  Patient was discharged on Sep 12, 2022 to [facility] inpatient rehab.   Pt presented to our ED on evening of 09/26/22, reported having just been discharged from rehab that day.  Reported a cough and thick mucus from his trach.  During EMS transport from rehab to home, he had sudden shortness of breath and severe coughing fit, so he was brought to the ED for evaluation.  Initially O2 sats stabilized on trach collar O2, but due to recurrent desaturations and severe hypercapnea, he required admission and placement in mechanical ventilation.    PCCM team is following for respiratory management.        Assessment and Plan: * Acute on chronic respiratory failure with hypoxia and hypercapnia ([name]) Pseudomonas colonization Possible CAP Chronic tracheostomy Nocturnal ventilation dependent OSA/OHS We will move patient from the ICU to progressive unit today We will continue to monitor respiratory function Patient is status post bronchoscopy with bronchial alveolar  lavage showing Pseudomonas which is felt to be colonization Continue ciprofloxacin We will continue diuresis as needed Continue scheduled Mucinex to thin secretions Nebulization as needed   Anticoagulated Chronic DVT Continue Eliquis   Acute on chronic diastolic (congestive) heart failure ([name]) CXR c/w pulmonary edema Given IV Lasix 80 mg in the ED Continue oral Lasix Strict input and output monitoring Daily weights   OSA (obstructive sleep apnea) Obesity hypoventilation syndrome Continue NIV   AKI on chronic kidney disease, stage 3a ([name]) Renal function normalized today -- Continue Lasix   Morbid obesity with BMI of 50.0-59.9, adult ([name]) Body mass index is 51.37 kg/m. Complicates overall care and prognosis.  Recommend lifestyle modifications including physical activity and diet for weight loss and overall long-term health.    Ambulatory dysfunction Patient needs a power wheelchair at home. Patient suffers from acute/chronic respiratory failure with persistent limitations in strength, endurance and overall mobility that limits his ability to perform daily activities ( toileting, dressing, grooming, bathing) and his ability to safely negotiate his home environment.  A cane, walker, crutch or manual wheelchair will not resolve the patient's issue with performing these daily activities.  Patient therefore require power wheelchair evaluation with specific features to address seat height, width, sitting surface, positioning etc. to facilitate the appropriate access to his home environment upon discharge.  Iron deficiency anemia Hbg stable. Monitor CBC.   Anemia of chronic disease Hbg stable. No signs of bleeding. Monitor CBC   Acute metabolic encephalopathy Due to hypercapnia. Mentation improving after placed on the vent.  Monitor closely. --Stat ABG if lethargic -- Continue on NIV   Pressure  injury of skin POA. I agree with wound description as outlined.  Continue  diligent wound care, offloading pressure frequently & monitor for s/sx's of infection. Pressure Injury 09/27/22 Buttocks Left;Medial;Upper Stage 2 -  Partial thickness loss of dermis presenting as a shallow open injury with a red, pink wound bed without slough. Left buttocks small stage 2 (Active)  09/27/22 1130  Location: Buttocks  Location Orientation: Left;Medial;Upper  Staging: Stage 2 -  Partial thickness loss of dermis presenting as a shallow open injury with a red, pink wound bed without slough.  Wound Description (Comments): Left buttocks small stage 2  Present on Admission: Yes        Physical Exam: General exam: Morbidly obese in no acute distress currently on trach collar HEENT: Trach collar in place, hearing grossly normal  Respiratory system: Transmitted sounds Cardiovascular system: normal S1/S2, RRR trace bilateral lower extremity edema Gastrointestinal system: soft, NT, ND, no HSM felt Central nervous system: grossly non-focal exam, normal speech, A&Ox3 Extremities: moves all, trace BLE edema, normal tone Skin: dry, intact, normal temperature Psychiatry: normal mood, flat affect     Data Reviewed: Labs reviewed sodium 142 potassium 4.2 creatinine 1.1   Family Communication: None   Disposition: Status is: Inpatient Patient continues to work with PT OT Physical therapist continues to work with patient This managers working on getting home devices ready before discharge Patient prefers to be discharged home as opposed to rehab as he has failed several rehab trials   Planned Discharge Destination: Home with home health services  Patient is high risk of morbidity and mortality due to severe chronic co-morbid conditions, recurrent hypercapnic respiratory failure, morbid obesity with severe OSA/OHS   Time spent: 40 minutes     Vitals:   10/04/22 0754 10/04/22 0758 10/04/22 1428 10/04/22 1708  BP:  131/73 (!) 140/81 123/69  Pulse:  77 80 76  Resp:  (!) 22 18 20    Temp:  97.8 F (36.6 C) 98.3 F (36.8 C) 98.2 F (36.8 C)  TempSrc:  Oral Oral Axillary  SpO2: 95% 97% 98% 98%  Weight:      Height:        Author: Loyce Dys, MD 10/04/2022 5:31 PM  For on call review [email or web address].

## 2022-10-04 NOTE — Progress Notes (Signed)
Patient called 911 stating no one was helping him- 3 Rns including Charge viewed patient- another RN and Primary RN assisted patient with minimal assist from edge of bed back into supine position-patient was able to pull himself up and reposition in the bed

## 2022-10-04 NOTE — Progress Notes (Signed)
Physical Therapy Treatment Patient Details Name: Gary [name] MRN: [id number] DOB: [birth year] Today's Date: 10/04/2022   History of Present Illness Pt is a 67 y/o M admitted on 09/26/22 for tx of acute on chronic respiratory failure with hypoxia & hypercapnia, possible CAP. PMH: morbid obesity, chronic trach/trach collar dependent, trilogy vent at night, lower GI bleed, chronic hypoxic & hypercapnic respiratory failure due to OSA, OHS, COPD, HTN, dyslipidemia, chronic    PT Comments    Patient is agreeable to PT. He was able to stand x 2 bouts from 27 inch bed height with Min guard assistance and reinforcement of technique to facilitate independence. Patient is motivated to attempt standing from lower height surfaces and is agreeable to attempt standing from recliner chair and bed side commode. Recommend to continue PT to maximize independence and to decrease caregiver burden. The patient wants to return home at discharge.    Recommendations for follow up therapy are one component of a multi-disciplinary discharge planning process, led by the attending physician.  Recommendations may be updated based on patient status, additional functional criteria and insurance authorization.  Follow Up Recommendations       Assistance Recommended at Discharge Frequent or constant Supervision/Assistance  Patient can return home with the following A lot of help with walking and/or transfers;A lot of help with bathing/dressing/bathroom;Assistance with cooking/housework;Assistance with feeding;Direct supervision/assist for medications management;Direct supervision/assist for financial management;Assist for transportation;Help with stairs or ramp for entrance   Equipment Recommendations  Wheelchair (measurements PT);Wheelchair cushion (measurements PT);Hospital bed;BSC/3in1;Rolling walker (2 wheels) (bariatric equipment if going home)    Recommendations for Other Services       Precautions / Restrictions  Precautions Precautions: Fall Precaution Comments: trach Restrictions Weight Bearing Restrictions: No     Mobility  Bed Mobility Overal bed mobility: Modified Independent             General bed mobility comments: increased time, no physical assistance    Transfers Overall transfer level: Needs assistance Equipment used: Rolling walker (2 wheels) Transfers: Sit to/from Stand Sit to Stand: Min guard           General transfer comment: patient able to stand x 2 bouts from 27 inches. increased time required to transition hands from bed to rolling walker once standing with Min guard provided for safety. cues for technique and patient using momentum to facilitate lift off with standing.    Ambulation/Gait               General Gait Details: not tested today   Stairs             Wheelchair Mobility    Modified Rankin (Stroke Patients Only)       Balance Overall balance assessment: Needs assistance Sitting-balance support: Feet supported Sitting balance-[name] Scale: Good     Standing balance support: Bilateral upper extremity supported Standing balance-[name] Scale: Fair Standing balance comment: with RW for support in standing                            Cognition Arousal/Alertness: Awake/alert Behavior During Therapy: WFL for tasks assessed/performed, Anxious Overall Cognitive Status: Within Functional Limits for tasks assessed                                          Exercises General Exercises - Lower Extremity Ankle Circles/Pumps: AROM,  Strengthening, Both, 10 reps, Supine Straight Leg Raises: AROM, AAROM, Strengthening, Both, 10 reps, Supine (AAROM for lifting past around 2 inches from bed)    General Comments General comments (skin integrity, edema, etc.): at end of session patient reports he feels oxygen is not coming out properly from oxygen tubing. blue tube was repositioned and patient reports improvement.  respiratory and nurse alerted      Pertinent Vitals/Pain Pain Assessment Pain Assessment: No/denies pain    Home Living                          Prior Function            PT Goals (current goals can now be found in the care plan section) Acute Rehab PT Goals Patient Stated Goal: to get a lift chair, to go home PT Goal Formulation: With patient Time For Goal Achievement: 10/14/22 Potential to Achieve Goals: Fair Progress towards PT goals: Progressing toward goals    Frequency    Min 3X/week      PT Plan Current plan remains appropriate    Co-evaluation              AM-PAC PT "6 Clicks" Mobility   Outcome Measure  Help needed turning from your back to your side while in a flat bed without using bedrails?: A Little Help needed moving from lying on your back to sitting on the side of a flat bed without using bedrails?: A Little Help needed moving to and from a bed to a chair (including a wheelchair)?: A Lot Help needed standing up from a chair using your arms (e.g., wheelchair or bedside chair)?: A Lot Help needed to walk in hospital room?: A Lot Help needed climbing 3-5 steps with a railing? : Total 6 Click Score: 13    End of Session Equipment Utilized During Treatment: Oxygen (trach mask oxygen) Activity Tolerance: Patient tolerated treatment well Patient left: in bed;with call bell/phone within reach Nurse Communication: Mobility status PT Visit Diagnosis: Muscle weakness (generalized) (M62.81);Difficulty in walking, not elsewhere classified (R26.2)     Time: 1315-1400 PT Time Calculation (min) (ACUTE ONLY): 45 min  Charges:  $Therapeutic Exercise: 8-22 mins $Therapeutic Activity: 23-37 mins                     Gary [name], PT, MPT    Gary [name] 10/04/2022, 3:40 PM

## 2022-10-05 DIAGNOSIS — J9622 Acute and chronic respiratory failure with hypercapnia: Secondary | ICD-10-CM | POA: Diagnosis not present

## 2022-10-05 DIAGNOSIS — J9621 Acute and chronic respiratory failure with hypoxia: Secondary | ICD-10-CM | POA: Diagnosis not present

## 2022-10-05 LAB — BASIC METABOLIC PANEL
Anion gap: 5 (ref 5–15)
BUN: 41 mg/dL — ABNORMAL HIGH (ref 8–23)
CO2: 30 mmol/L (ref 22–32)
Calcium: 7.4 mg/dL — ABNORMAL LOW (ref 8.9–10.3)
Chloride: 104 mmol/L (ref 98–111)
Creatinine, Ser: 1.12 mg/dL (ref 0.61–1.24)
GFR, Estimated: >60 mL/min (ref >60)
Glucose, Bld: 195 mg/dL — ABNORMAL HIGH (ref 70–99)
Potassium: 5.1 mmol/L (ref 3.5–5.1)
Sodium: 139 mmol/L (ref 135–145)

## 2022-10-05 LAB — CBC WITH DIFFERENTIAL/PLATELET
Abs Immature Granulocytes: 0.14 10*3/uL — ABNORMAL HIGH (ref 0.00–0.07)
Basophils Absolute: 0 10*3/uL (ref 0.0–0.1)
Basophils Relative: 0 %
Eosinophils Absolute: 0 10*3/uL (ref 0.0–0.5)
Eosinophils Relative: 0 %
HCT: 36.2 % — ABNORMAL LOW (ref 39.0–52.0)
Hemoglobin: 9.4 g/dL — ABNORMAL LOW (ref 13.0–17.0)
Immature Granulocytes: 2 %
Lymphocytes Relative: 4 %
Lymphs Abs: 0.4 10*3/uL — ABNORMAL LOW (ref 0.7–4.0)
MCH: 21.4 pg — ABNORMAL LOW (ref 26.0–34.0)
MCHC: 26 g/dL — ABNORMAL LOW (ref 30.0–36.0)
MCV: 82.5 fL (ref 80.0–100.0)
Monocytes Absolute: 0.5 10*3/uL (ref 0.1–1.0)
Monocytes Relative: 6 %
Neutro Abs: 8.2 10*3/uL — ABNORMAL HIGH (ref 1.7–7.7)
Neutrophils Relative %: 88 %
Platelets: 228 10*3/uL (ref 150–400)
RBC: 4.39 MIL/uL (ref 4.22–5.81)
RDW: 18.4 % — ABNORMAL HIGH (ref 11.5–15.5)
WBC: 9.2 10*3/uL (ref 4.0–10.5)
nRBC: 0 % (ref 0.0–0.2)

## 2022-10-05 MED ORDER — PREDNISONE 10 MG PO TABS: 10.0000 mg | ORAL_TABLET | Freq: Every day | ORAL | Status: DC

## 2022-10-05 MED ORDER — DOCUSATE SODIUM 100 MG PO CAPS
100.0000 mg | ORAL_CAPSULE | Freq: Two times a day (BID) | ORAL | Status: DC
  Administered 2022-10-07: 100 mg via ORAL
  Filled 2022-10-05 (×4): qty 1

## 2022-10-05 MED ORDER — PANTOPRAZOLE SODIUM 40 MG PO TBEC
40.0000 mg | DELAYED_RELEASE_TABLET | Freq: Every day | ORAL | Status: DC
  Administered 2022-10-05 – 2022-10-07 (×3): 40 mg via ORAL
  Filled 2022-10-05 (×3): qty 1

## 2022-10-05 MED ORDER — POLYETHYLENE GLYCOL 3350 17 G PO PACK
17.0000 g | PACK | Freq: Every day | ORAL | Status: DC
  Filled 2022-10-05 (×2): qty 1

## 2022-10-05 MED ORDER — PREDNISONE 20 MG PO TABS: 30.0000 mg | ORAL_TABLET | Freq: Every day | ORAL | Status: DC

## 2022-10-05 MED ORDER — APIXABAN 2.5 MG PO TABS
2.5000 mg | ORAL_TABLET | Freq: Two times a day (BID) | ORAL | Status: DC
  Administered 2022-10-05 – 2022-10-08 (×6): 2.5 mg via ORAL
  Filled 2022-10-05 (×6): qty 1

## 2022-10-05 MED ORDER — PREDNISONE 20 MG PO TABS
40.0000 mg | ORAL_TABLET | Freq: Every day | ORAL | Status: AC
  Administered 2022-10-06 – 2022-10-08 (×3): 40 mg via ORAL
  Filled 2022-10-05 (×3): qty 2

## 2022-10-05 MED ORDER — PREDNISONE 20 MG PO TABS: 20.0000 mg | ORAL_TABLET | Freq: Every day | ORAL | Status: DC

## 2022-10-05 MED ORDER — CIPROFLOXACIN HCL 500 MG PO TABS
750.0000 mg | ORAL_TABLET | Freq: Two times a day (BID) | ORAL | Status: AC
  Administered 2022-10-05: 750 mg via ORAL
  Filled 2022-10-05: qty 1.5

## 2022-10-05 NOTE — Progress Notes (Signed)
PHARMACIST - PHYSICIAN COMMUNICATION  CONCERNING: IV to Oral Route Change Policy  RECOMMENDATION: This patient is receiving pantoprazole by the intravenous route.  Based on criteria approved by the Pharmacy and Therapeutics Committee, the intravenous medication(s) is/are being converted to the equivalent oral dose form(s).  DESCRIPTION: These criteria include: The patient is eating (either orally or via tube) and/or has been taking other orally administered medications for a least 24 hours The patient has no evidence of active gastrointestinal bleeding or impaired GI absorption (gastrectomy, short bowel, patient on TNA or NPO).  If you have questions about this conversion, please contact the Pharmacy Department   Tressie Ellis, [facility] 10/05/2022 11:56 AM

## 2022-10-05 NOTE — Progress Notes (Signed)
Physical Therapy Treatment Patient Details Name: Gary [name] MRN: [id number] DOB: 1955/08/29 Today's Date: 10/05/2022   History of Present Illness Pt is a 67 y/o M admitted on 09/26/22 for tx of acute on chronic respiratory failure with hypoxia & hypercapnia, possible CAP. PMH: morbid obesity, chronic trach/trach collar dependent, trilogy vent at night, lower GI bleed, chronic hypoxic & hypercapnic respiratory failure due to OSA, OHS, COPD, HTN, dyslipidemia, chronic    PT Comments    Patient was agreeable to PT. Rolling performed in bed prior to getting up due to wet sheets from leaking purwick. Patient seems more anxious at times with mobility, especially with having the head of bed flat. Encouraged patient to sit upright for comfort. No physical assistance is required with bed mobility. Patient able to stand from 28 inches with Min guard assistance using rolling walker and perform stand step transfer to recliner chair. Patient reports breathing feels better while seated in the recliner.  The patient feels he will be able to return home on Monday. Patient could benefit from bariatric equipment that has lifting assistance if going home, and he is requesting a lift chair. He will require frequent or constant caregiver assistance if going home. Recommend to continue PT to maximize independence and decrease caregiver burden.     Recommendations for follow up therapy are one component of a multi-disciplinary discharge planning process, led by the attending physician.  Recommendations may be updated based on patient status, additional functional criteria and insurance authorization.  Follow Up Recommendations       Assistance Recommended at Discharge Frequent or constant Supervision/Assistance  Patient can return home with the following A lot of help with walking and/or transfers;A lot of help with bathing/dressing/bathroom;Assistance with cooking/housework;Assistance with feeding;Direct  supervision/assist for medications management;Direct supervision/assist for financial management;Assist for transportation;Help with stairs or ramp for entrance   Equipment Recommendations  Wheelchair (measurements PT);Wheelchair cushion (measurements PT);Hospital bed;BSC/3in1;Rolling walker (2 wheels) (bariatric equipment)    Recommendations for Other Services       Precautions / Restrictions Precautions Precautions: Fall Precaution Comments: trach Restrictions Weight Bearing Restrictions: No     Mobility  Bed Mobility Overal bed mobility: Modified Independent Bed Mobility: Rolling Rolling: Modified independent (Device/Increase time)         General bed mobility comments: patient performed rolling in bed to left and right before getting up due to leaking purwick. he is anxious while having the head of bed flat and requested to sit up in the chair for improved breathing. no physical assistance is required with bed mobility    Transfers Overall transfer level: Needs assistance Equipment used: Rolling walker (2 wheels) Transfers: Bed to chair/wheelchair/BSC Sit to Stand: Min guard           General transfer comment: patient able to stand from bed at approximately 28 inches and perform step pivot to recliner chair with Min gaurd assistance. cues for hand placement and increased eccentric control    Ambulation/Gait                   Stairs             Wheelchair Mobility    Modified Rankin (Stroke Patients Only)       Balance Overall balance assessment: Needs assistance Sitting-balance support: Feet supported Sitting balance-[name] Scale: Good     Standing balance support: Bilateral upper extremity supported Standing balance-[name] Scale: Fair Standing balance comment: with RW for support in standing  Cognition Arousal/Alertness: Awake/alert Behavior During Therapy: Anxious Overall Cognitive Status: Within  Functional Limits for tasks assessed                                 General Comments: patient seems more anxious at times today        Exercises      General Comments        Pertinent Vitals/Pain Pain Assessment Pain Assessment: No/denies pain    Home Living                          Prior Function            PT Goals (current goals can now be found in the care plan section) Acute Rehab PT Goals Patient Stated Goal: to get a lift chair, to go home PT Goal Formulation: With patient Time For Goal Achievement: 10/14/22 Potential to Achieve Goals: Fair Progress towards PT goals: Progressing toward goals    Frequency    Min 3X/week      PT Plan Current plan remains appropriate    Co-evaluation              AM-PAC PT "6 Clicks" Mobility   Outcome Measure  Help needed turning from your back to your side while in a flat bed without using bedrails?: A Little Help needed moving from lying on your back to sitting on the side of a flat bed without using bedrails?: A Little Help needed moving to and from a bed to a chair (including a wheelchair)?: A Lot Help needed standing up from a chair using your arms (e.g., wheelchair or bedside chair)?: A Lot Help needed to walk in hospital room?: A Lot Help needed climbing 3-5 steps with a railing? : Total 6 Click Score: 13    End of Session Equipment Utilized During Treatment: Oxygen Activity Tolerance: Patient tolerated treatment well Patient left: in chair;with call bell/phone within reach;with nursing/sitter in room Nurse Communication: Mobility status;Need for lift equipment (discussed plan with nurse who plans to assist patient back to be using the lift before the end of the shift) PT Visit Diagnosis: Muscle weakness (generalized) (M62.81);Difficulty in walking, not elsewhere classified (R26.2)     Time: 1323-1410 PT Time Calculation (min) (ACUTE ONLY): 47 min  Charges:  $Therapeutic  Activity: 38-52 mins                     [name], PT, MPT    [name] 10/05/2022, 2:47 PM

## 2022-10-05 NOTE — Plan of Care (Signed)
  Problem: Education: Goal: Ability to describe self-care measures that may prevent or decrease complications (Diabetes Survival Skills Education) will improve Outcome: Progressing Goal: Individualized Educational Video(s) Outcome: Progressing   Problem: Coping: Goal: Ability to adjust to condition or change in health will improve Outcome: Progressing   Problem: Fluid Volume: Goal: Ability to maintain a balanced intake and output will improve Outcome: Progressing   Problem: Health Behavior/Discharge Planning: Goal: Ability to identify and utilize available resources and services will improve Outcome: Progressing Goal: Ability to manage health-related needs will improve Outcome: Progressing   Problem: Metabolic: Goal: Ability to maintain appropriate glucose levels will improve Outcome: Progressing   Problem: Nutritional: Goal: Maintenance of adequate nutrition will improve Outcome: Progressing Goal: Progress toward achieving an optimal weight will improve Outcome: Progressing   Problem: Skin Integrity: Goal: Risk for impaired skin integrity will decrease Outcome: Progressing   Problem: Tissue Perfusion: Goal: Adequacy of tissue perfusion will improve Outcome: Progressing   Problem: Education: Goal: Knowledge of General Education information will improve Description: Including pain rating scale, medication(s)/side effects and non-pharmacologic comfort measures Outcome: Progressing   Problem: Health Behavior/Discharge Planning: Goal: Ability to manage health-related needs will improve Outcome: Progressing   Problem: Clinical Measurements: Goal: Ability to maintain clinical measurements within normal limits will improve Outcome: Progressing Goal: Will remain free from infection Outcome: Progressing Goal: Diagnostic test results will improve Outcome: Progressing Goal: Respiratory complications will improve Outcome: Progressing Goal: Cardiovascular complication will  be avoided Outcome: Progressing   Problem: Activity: Goal: Risk for activity intolerance will decrease Outcome: Progressing   Problem: Nutrition: Goal: Adequate nutrition will be maintained Outcome: Progressing   Problem: Coping: Goal: Level of anxiety will decrease Outcome: Progressing   Problem: Elimination: Goal: Will not experience complications related to bowel motility Outcome: Progressing Goal: Will not experience complications related to urinary retention Outcome: Progressing   Problem: Pain Managment: Goal: General experience of comfort will improve Outcome: Progressing   Problem: Safety: Goal: Ability to remain free from injury will improve Outcome: Progressing   Problem: Skin Integrity: Goal: Risk for impaired skin integrity will decrease Outcome: Progressing   Problem: Safety: Goal: Non-violent Restraint(s) Outcome: Progressing   

## 2022-10-05 NOTE — Progress Notes (Signed)
Progress Note   Patient: Gary [name] ZOX:[id number] DOB: 09-22-1955 DOA: 09/26/2022     9 DOS: the patient was seen and examined on 10/05/2022   Subjective:  Patient denied any active complaint Patient wanted his trach tube change I discussed this with respiratory therapist   Brief hospital course: Gary [name] is a 67 y.o. male with medical history significant of Gary [name]  is a 67 y.o. 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 Recent admision on 09/07/2022 for BRBPR  s/p EGD and colonscopy. S.p. augmentin for divertifulitis.  Patient was discharged on Sep 12, 2022 to [facility] inpatient rehab.   Pt presented to our ED on evening of 09/26/22, reported having just been discharged from rehab that day.  Reported a cough and thick mucus from his trach.  During EMS transport from rehab to home, he had sudden shortness of breath and severe coughing fit, so he was brought to the ED for evaluation.  Initially O2 sats stabilized on trach collar O2, but due to recurrent desaturations and severe hypercapnea, he required admission and placement in mechanical ventilation.    PCCM team is following for respiratory management.        Assessment and Plan: * Acute on chronic respiratory failure with hypoxia and hypercapnia ([name]) Pseudomonas colonization Possible CAP Chronic tracheostomy Nocturnal ventilation dependent OSA/OHS We will move patient from the ICU to progressive unit today We will continue to monitor respiratory function Patient is status post bronchoscopy with bronchial alveolar lavage showing  Pseudomonas which is felt to be colonization Continue ciprofloxacin Tracheostomy tube have been changed by respiratory therapist after speaking with pulmonologist We will continue to work on home devices before discharge We will continue diuresis as needed Continue scheduled Mucinex to thin secretions Nebulization as needed   Anticoagulated Chronic DVT Continue Eliquis   Acute on chronic diastolic (congestive) heart failure ([name]) CXR c/w pulmonary edema Continue oral Lasix Monitor daily weight Input and output monitoring   OSA (obstructive sleep apnea) Obesity hypoventilation syndrome Continue NIV   AKI on chronic kidney disease, stage 3a ([name]) Renal function normalized today -- Continue Lasix   Morbid obesity with BMI of 50.0-59.9, adult ([name]) Body mass index is 51.37 kg/m. Complicates overall care and prognosis.  Recommend lifestyle modifications including physical activity and diet for weight loss and overall long-term health.     Ambulatory dysfunction Patient needs a power wheelchair at home. Patient suffers from acute/chronic respiratory failure with persistent limitations in strength, endurance and overall mobility that limits his ability to perform daily activities ( toileting, dressing, grooming, bathing) and his ability to safely negotiate his home environment.  A cane, walker, crutch or manual wheelchair will not resolve the patient's issue with performing these daily activities.  Patient therefore require power wheelchair evaluation with specific features to address seat height, width, sitting surface, positioning etc. to facilitate the appropriate access to his home environment upon discharge.   Iron deficiency anemia Hbg stable. Monitor CBC.   Anemia of chronic disease Hbg stable. No signs of bleeding. Monitor CBC   Acute metabolic encephalopathy Due to hypercapnia. Mentation improving after placed on the vent.  Monitor closely. --Stat ABG if lethargic --  Continue on NIV   Pressure injury of skin Agree with wound care recommendation  Continue wound care management  offloading pressure frequently & monitor for s/sx's of infection. Pressure Injury 09/27/22 Buttocks Left;Medial;Upper Stage 2 -  Partial thickness loss of dermis presenting as a shallow open injury with a red, pink wound bed without slough. Left buttocks small stage 2 (Active)  09/27/22 1130  Location: Buttocks  Location Orientation: Left;Medial;Upper  Staging: Stage 2 -  Partial thickness loss of dermis presenting as a shallow open injury with a red, pink wound bed without slough.  Wound Description (Comments): Left buttocks small stage 2  Present on Admission: Yes        Physical Exam: General exam: Morbidly obese in no acute distress currently on trach collar HEENT: Trach collar in place, hearing grossly normal  Respiratory system: Transmitted sounds Cardiovascular system: normal S1/S2, RRR trace bilateral lower extremity edema Gastrointestinal system: soft, NT, ND, no HSM felt Central nervous system: grossly non-focal exam, normal speech, A&Ox3 Extremities: moves all, trace BLE edema, normal tone Skin: dry, intact, normal temperature Psychiatry: normal mood, flat affect     Data Reviewed: Laboratory results reviewed by me today   Family Communication: None   Disposition: Status is: Inpatient Patient continues to work with PT OT Physical therapist continues to work with patient This managers working on getting home devices ready before discharge Patient prefers to be discharged home as opposed to rehab as he has failed several rehab trials    Planned Discharge Destination: Home with home health services   Patient is high risk of morbidity and mortality due to severe chronic co-morbid conditions, recurrent hypercapnic respiratory failure, morbid obesity with severe OSA/OHS   Time spent: I spent a total of 35 minutes discussing goals of care with patient,  respiratory therapist as well as case management in addition to reviewing patient's chart      Vitals:   10/05/22 0834 10/05/22 1209 10/05/22 1321 10/05/22 1616  BP: 137/75 127/80  124/81  Pulse: 82 82  81  Resp: 16 16  19   Temp: 97.8 F (36.6 C) 98 F (36.7 C)  98.8 F (37.1 C)  TempSrc:    Oral  SpO2: 99% 100% 96% 100%  Weight:      Height:         Author: Loyce Dys, MD 10/05/2022 6:15 PM  For on call review [email or web address].

## 2022-10-05 NOTE — Plan of Care (Signed)
  Problem: Education: Goal: Ability to describe self-care measures that may prevent or decrease complications (Diabetes Survival Skills Education) will improve Outcome: Progressing Goal: Individualized Educational Video(s) Outcome: Progressing   Problem: Coping: Goal: Ability to adjust to condition or change in health will improve Outcome: Progressing   Problem: Fluid Volume: Goal: Ability to maintain a balanced intake and output will improve Outcome: Progressing   Problem: Health Behavior/Discharge Planning: Goal: Ability to identify and utilize available resources and services will improve Outcome: Progressing Goal: Ability to manage health-related needs will improve Outcome: Progressing   Problem: Metabolic: Goal: Ability to maintain appropriate glucose levels will improve Outcome: Progressing   Problem: Nutritional: Goal: Maintenance of adequate nutrition will improve Outcome: Progressing Goal: Progress toward achieving an optimal weight will improve Outcome: Progressing   Problem: Skin Integrity: Goal: Risk for impaired skin integrity will decrease Outcome: Progressing   Problem: Tissue Perfusion: Goal: Adequacy of tissue perfusion will improve Outcome: Progressing   Problem: Education: Goal: Knowledge of General Education information will improve Description: Including pain rating scale, medication(s)/side effects and non-pharmacologic comfort measures Outcome: Progressing   Problem: Clinical Measurements: Goal: Ability to maintain clinical measurements within normal limits will improve Outcome: Progressing Goal: Will remain free from infection Outcome: Progressing Goal: Diagnostic test results will improve Outcome: Progressing Goal: Respiratory complications will improve Outcome: Progressing Goal: Cardiovascular complication will be avoided Outcome: Progressing   Problem: Health Behavior/Discharge Planning: Goal: Ability to manage health-related needs  will improve Outcome: Progressing   Problem: Activity: Goal: Risk for activity intolerance will decrease Outcome: Progressing   Problem: Nutrition: Goal: Adequate nutrition will be maintained Outcome: Progressing   Problem: Coping: Goal: Level of anxiety will decrease Outcome: Progressing   Problem: Elimination: Goal: Will not experience complications related to bowel motility Outcome: Progressing Goal: Will not experience complications related to urinary retention Outcome: Progressing   Problem: Pain Managment: Goal: General experience of comfort will improve Outcome: Progressing   Problem: Safety: Goal: Ability to remain free from injury will improve Outcome: Progressing   Problem: Skin Integrity: Goal: Risk for impaired skin integrity will decrease Outcome: Progressing   Problem: Safety: Goal: Non-violent Restraint(s) Outcome: Progressing

## 2022-10-05 NOTE — Care Management Important Message (Signed)
Important Message  Patient Details  Name: Gary [name] MRN: [id number] Date of Birth: 04/03/1956   Medicare Important Message Given:  Yes  Patient asleep upon time of visit.  Copy of Medicare IM left in room on windowsill for reference.    [name] 10/05/2022, 10:47 AM

## 2022-10-05 NOTE — Plan of Care (Signed)
Trach tube changed to 7 Shiely XLT proximal cuffless. Tolerated well

## 2022-10-06 DIAGNOSIS — J9622 Acute and chronic respiratory failure with hypercapnia: Secondary | ICD-10-CM | POA: Diagnosis not present

## 2022-10-06 DIAGNOSIS — J9621 Acute and chronic respiratory failure with hypoxia: Secondary | ICD-10-CM | POA: Diagnosis not present

## 2022-10-06 LAB — BASIC METABOLIC PANEL
Anion gap: 4 — ABNORMAL LOW (ref 5–15)
BUN: 42 mg/dL — ABNORMAL HIGH (ref 8–23)
CO2: 32 mmol/L (ref 22–32)
Calcium: 7.5 mg/dL — ABNORMAL LOW (ref 8.9–10.3)
Chloride: 103 mmol/L (ref 98–111)
Creatinine, Ser: 1.18 mg/dL (ref 0.61–1.24)
GFR, Estimated: >60 mL/min (ref >60)
Glucose, Bld: 144 mg/dL — ABNORMAL HIGH (ref 70–99)
Potassium: 4.9 mmol/L (ref 3.5–5.1)
Sodium: 139 mmol/L (ref 135–145)

## 2022-10-06 LAB — CBC WITH DIFFERENTIAL/PLATELET
Abs Immature Granulocytes: 0.17 10*3/uL — ABNORMAL HIGH (ref 0.00–0.07)
Basophils Absolute: 0 10*3/uL (ref 0.0–0.1)
Basophils Relative: 0 %
Eosinophils Absolute: 0 10*3/uL (ref 0.0–0.5)
Eosinophils Relative: 0 %
HCT: 36 % — ABNORMAL LOW (ref 39.0–52.0)
Hemoglobin: 9.4 g/dL — ABNORMAL LOW (ref 13.0–17.0)
Immature Granulocytes: 2 %
Lymphocytes Relative: 9 %
Lymphs Abs: 0.9 10*3/uL (ref 0.7–4.0)
MCH: 21.3 pg — ABNORMAL LOW (ref 26.0–34.0)
MCHC: 26.1 g/dL — ABNORMAL LOW (ref 30.0–36.0)
MCV: 81.6 fL (ref 80.0–100.0)
Monocytes Absolute: 1.6 10*3/uL — ABNORMAL HIGH (ref 0.1–1.0)
Monocytes Relative: 15 %
Neutro Abs: 8 10*3/uL — ABNORMAL HIGH (ref 1.7–7.7)
Neutrophils Relative %: 74 %
Platelets: 220 10*3/uL (ref 150–400)
RBC: 4.41 MIL/uL (ref 4.22–5.81)
RDW: 18.4 % — ABNORMAL HIGH (ref 11.5–15.5)
WBC: 10.7 10*3/uL — ABNORMAL HIGH (ref 4.0–10.5)
nRBC: 0.3 % — ABNORMAL HIGH (ref 0.0–0.2)

## 2022-10-06 LAB — CULTURE, BLOOD (SINGLE): Special Requests: ADEQUATE

## 2022-10-06 MED ORDER — IPRATROPIUM-ALBUTEROL 0.5-2.5 (3) MG/3ML IN SOLN
3.0000 mL | Freq: Two times a day (BID) | RESPIRATORY_TRACT | Status: DC
  Administered 2022-10-06 – 2022-10-08 (×5): 3 mL via RESPIRATORY_TRACT
  Filled 2022-10-06 (×5): qty 3

## 2022-10-06 NOTE — Progress Notes (Signed)
Patient called nurses station saying his chest was burning. Upon entering the room patient informed me he was laying flat and as soon as he raised the head of the bed up his chest burns stopped.  I informed patient to call for help if anything changes

## 2022-10-06 NOTE — Progress Notes (Signed)
Progress Note   Patient: Gary [name] DOB: 1956-01-04 DOA: 09/26/2022     10 DOS: the patient was seen and examined on 10/06/2022    Subjective:  Patient seen and examined at bedside this morning Admits to no acute overnight events   Brief [name] course: Gary [name] is a 67 y.o. male with medical history significant of Gary [name]  is a 67 y.o. 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 Recent admision on 09/07/2022 for BRBPR  s/p EGD and colonscopy. S.p. augmentin for divertifulitis.  Patient was discharged on Sep 12, 2022 to [name].   Pt presented to our ED on evening of 09/26/22, reported having just been discharged from rehab that day.  Reported a cough and thick mucus from his trach.  During EMS transport from rehab to home, he had sudden shortness of breath and severe coughing fit, so he was brought to the ED for evaluation.  Initially O2 sats stabilized on trach collar O2, but due to recurrent desaturations and severe hypercapnea, he required admission and placement in mechanical ventilation.        Assessment and Plan: * Acute on chronic respiratory failure with hypoxia and hypercapnia ([name]) Pseudomonas colonization Possible CAP Chronic tracheostomy Nocturnal ventilation dependent OSA/OHS Continue management on telemetry follow Continue to monitor respiratory function Patient is status post bronchoscopy with bronchial alveolar lavage showing Pseudomonas which is felt to be colonization Continue ciprofloxacin Tracheostomy tube have been changed by  respiratory therapist after speaking with pulmonologist We will continue to work on home devices before discharge We will continue diuresis as needed Continue scheduled Mucinex to thin secretions Nebulization as needed   Anticoagulated Chronic DVT Continue Eliquis   Acute on chronic diastolic (congestive) heart failure ([name]) CXR c/w pulmonary edema Continue oral Lasix Monitor daily weight Input and output monitoring   OSA (obstructive sleep apnea) Obesity hypoventilation syndrome Continue noninvasive ventilation   AKI on chronic kidney disease, stage 3a ([name]) Renal function normalized today -- Continue Lasix   Morbid obesity with BMI of 50.0-59.9, adult ([name]) Body mass index is 51.37 kg/m. Complicates overall care and prognosis.  Recommend lifestyle modifications including physical activity and diet for weight loss and overall long-term health.     Ambulatory dysfunction Patient needs a power wheelchair at home. Patient suffers from acute/chronic respiratory failure with persistent limitations in strength, endurance and overall mobility that limits his ability to perform daily activities ( toileting, dressing, grooming, bathing) and his ability to safely negotiate his home environment.  A cane, walker, crutch or manual wheelchair will not resolve the patient's issue with performing these daily activities.  Patient therefore require power wheelchair evaluation with specific features to address seat height, width, sitting surface, positioning etc. to facilitate the appropriate access to his home environment upon discharge. Awaiting arrangement of home health and likely discharge by Monday  Iron deficiency anemia Hbg stable. Monitor CBC.   Anemia of chronic disease Hbg stable. No signs of bleeding. Monitor CBC closely   Acute metabolic encephalopathy Due to hypercapnia. Mentation improving after placed on the vent.  Monitor closely. --Stat ABG if lethargic -- Continue on  NIV   Pressure injury of skin Agree with wound care recommendation  Continue wound care management  offloading pressure frequently & monitor for s/sx's of infection. Pressure Injury 09/27/22 Buttocks Left;Medial;Upper Stage 2 -  Partial thickness loss of dermis presenting as a shallow open injury with a red, pink wound bed without slough. Left buttocks small stage 2 (Active)  09/27/22 1130  Location: Buttocks  Location Orientation: Left;Medial;Upper  Staging: Stage 2 -  Partial thickness loss of dermis presenting as a shallow open injury with a red, pink wound bed without slough.  Wound Description (Comments): Left buttocks small stage 2  Present on Admission: Yes        Physical Exam: General exam morbidly obese male in no acute distress on trach collar HEENT: Trach collar in place, hearing grossly normal  Respiratory system: Transmitted sounds Cardiovascular system: normal S1/S2, RRR trace bilateral lower extremity edema Gastrointestinal system: soft, NT, ND, no HSM felt Central nervous system: grossly non-focal exam, normal speech, A&Ox3 Extremities: moves all, trace BLE edema, normal tone Skin: dry, intact, normal temperature Psychiatry: normal mood, flat affect     Data Reviewed: Laboratory results reviewed by me today   Family Communication: None   Disposition: Status is: Inpatient Patient continues to work with PT OT Physical therapist continues to work with patient This managers working on getting home devices ready before discharge Patient prefers to be discharged home as opposed to rehab as he has failed several rehab trials    Planned Discharge Destination: Home with home health services   Patient is high risk of morbidity and mortality due to severe chronic co-morbid conditions, recurrent hypercapnic respiratory failure, morbid obesity with severe OSA/OHS      Vitals:   10/05/22 2323 10/06/22 0539 10/06/22 0607 10/06/22 0900  BP:  113/73  118/79  Pulse:  85 80  87  Resp:  20    Temp:  98 F (36.7 C)    TempSrc:      SpO2: 94% 98% 96% 96%  Weight:      Height:       Author: Loyce Dys, MD 10/06/2022 3:51 PM  For on call review [email or web address].

## 2022-10-07 DIAGNOSIS — J9622 Acute and chronic respiratory failure with hypercapnia: Secondary | ICD-10-CM | POA: Diagnosis not present

## 2022-10-07 DIAGNOSIS — J9621 Acute and chronic respiratory failure with hypoxia: Secondary | ICD-10-CM | POA: Diagnosis not present

## 2022-10-07 LAB — BASIC METABOLIC PANEL
Anion gap: 3 — ABNORMAL LOW (ref 5–15)
BUN: 44 mg/dL — ABNORMAL HIGH (ref 8–23)
CO2: 35 mmol/L — ABNORMAL HIGH (ref 22–32)
Calcium: 7.6 mg/dL — ABNORMAL LOW (ref 8.9–10.3)
Chloride: 103 mmol/L (ref 98–111)
Creatinine, Ser: 1.16 mg/dL (ref 0.61–1.24)
GFR, Estimated: >60 mL/min (ref >60)
Glucose, Bld: 98 mg/dL (ref 70–99)
Potassium: 5 mmol/L (ref 3.5–5.1)
Sodium: 141 mmol/L (ref 135–145)

## 2022-10-07 LAB — ORGANISM ID, BACTERIA

## 2022-10-07 LAB — BACTERIAL ORGANISM REFLEX

## 2022-10-07 LAB — CULTURE, BLOOD (SINGLE)

## 2022-10-07 NOTE — Progress Notes (Addendum)
Progress Note   Patient: Gary [name] ZOX:[id number] DOB: Jun 03, 1955 DOA: 09/26/2022     11 DOS: the patient was seen and examined on 10/07/2022  Subjective:  Patient seen and examined at bedside this morning Admits to no acute overnight events He requested for a bed bath and wipes I have placed an order for this So working with PT OT   Brief hospital course: Gary [name] is a 67 y.o. male with medical history significant of Gary [name]  is a 67 y.o. 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 Recent admision on 09/07/2022 for BRBPR  s/p EGD and colonscopy. S.p. augmentin for divertifulitis.  Patient was discharged on Sep 12, 2022 to [facility] inpatient rehab.   Pt presented to our ED on evening of 09/26/22, reported having just been discharged from rehab that day.  Reported a cough and thick mucus from his trach.  During EMS transport from rehab to home, he had sudden shortness of breath and severe coughing fit, so he was brought to the ED for evaluation.  Initially O2 sats stabilized on trach collar O2, but due to recurrent desaturations and severe hypercapnea, he required admission and placement in mechanical ventilation.        Assessment and Plan: * Acute on chronic respiratory failure with hypoxia and hypercapnia ([name]) Pseudomonas colonization Possible CAP Chronic tracheostomy Nocturnal ventilation dependent OSA/OHS Continue management on telemetry follow Continue to monitor respiratory function Patient is status post bronchoscopy with bronchial alveolar lavage showing Pseudomonas which is felt  to be colonization Has completed a course of ciprofloxacin Tracheostomy tube have been changed by respiratory therapist after speaking with pulmonologist Transition of care on board.  Appreciate input We will continue diuresis as needed Continue Mucinex Continue to taper off prednisone Nebulization as needed   Anticoagulated Chronic DVT Continue Eliquis   Acute on chronic diastolic (congestive) heart failure ([name]) CXR c/w pulmonary edema Continue oral Lasix Monitor daily weight Input and output monitoring   OSA (obstructive sleep apnea) Obesity hypoventilation syndrome Continue noninvasive ventilation   AKI on chronic kidney disease, stage 3a ([name]) Renal function normalized  Continue oral Lasix   Morbid obesity with BMI of 50.0-59.9, adult ([name]) Body mass index is 51.37 kg/m. Complicates overall care and prognosis.  Recommend lifestyle modifications including physical activity and diet for weight loss and overall long-term health.     Ambulatory dysfunction Patient needs a power wheelchair at home. Patient suffers from acute/chronic respiratory failure with persistent limitations in strength, endurance and overall mobility that limits his ability to perform daily activities ( toileting, dressing, grooming, bathing) and his ability to safely negotiate his home environment.  A cane, walker, crutch or manual wheelchair will not resolve the patient's issue with performing these daily activities.  Patient therefore require power wheelchair evaluation with specific features to address seat height, width, sitting surface, positioning etc. to facilitate the appropriate access to his home environment upon discharge. Awaiting arrangement of home health and likely discharge by Monday   Iron deficiency anemia Hbg stable. Monitor CBC.   Anemia of chronic disease Hbg stable. No signs of bleeding. Monitor CBC closely   Acute  metabolic encephalopathy Due to hypercapnia. Mentation  improving after placed on the vent.  Monitor closely. --Stat ABG if lethargic -- Continue on NIV   Pressure injury of skin Agree with wound care recommendation  Continue wound care management  offloading pressure frequently & monitor for s/sx's of infection. Pressure Injury 09/27/22 Buttocks Left;Medial;Upper Stage 2 -  Partial thickness loss of dermis presenting as a shallow open injury with a red, pink wound bed without slough. Left buttocks small stage 2 (Active)  09/27/22 1130  Location: Buttocks  Location Orientation: Left;Medial;Upper  Staging: Stage 2 -  Partial thickness loss of dermis presenting as a shallow open injury with a red, pink wound bed without slough.  Wound Description (Comments): Left buttocks small stage 2  Present on Admission: Yes        Physical Exam: General exam morbidly obese male in no acute distress on trach collar HEENT: Trach collar in place, hearing grossly normal  Respiratory system: Transmitted sounds Cardiovascular system: normal S1/S2, RRR trace bilateral lower extremity edema Gastrointestinal system: soft, NT, ND, no HSM felt Central nervous system: grossly non-focal exam, normal speech, A&Ox3 Extremities: moves all, trace BLE edema, normal tone Skin: dry, intact, normal temperature Psychiatry: normal mood, flat affect     Data Reviewed: Laboratory results reviewed by me today   Family Communication: None   Disposition: Status is: Inpatient Patient continues to work with PT OT Physical therapist continues to work with patient Case managers working on getting home devices ready before discharge Patient prefers to be discharged home as opposed to rehab as he has failed several rehab trials    Planned Discharge Destination: Home with home health services   Patient is high risk of morbidity and mortality due to severe chronic co-morbid conditions, recurrent hypercapnic respiratory failure, morbid obesity with severe OSA/OHS        Data reviewed: I have reviewed the patient's CBC as well as BMP results today showing sodium 141  Vitals:   10/07/22 0611 10/07/22 0752 10/07/22 1212 10/07/22 1554  BP:  118/61 117/73 109/68  Pulse:  80 81 85  Resp:  18 18 18   Temp:  98.1 F (36.7 C) 98.3 F (36.8 C) 98.2 F (36.8 C)  TempSrc:      SpO2: 96% 92% 95% 92%  Weight:      Height:         Author: Loyce Dys, MD 10/07/2022 4:59 PM  For on call review [email or web address].
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
## 2022-10-07 NOTE — Plan of Care (Signed)
  Problem: Education: Goal: Ability to describe self-care measures that may prevent or decrease complications (Diabetes Survival Skills Education) will improve Outcome: Progressing Goal: Individualized Educational Video(s) Outcome: Progressing   Problem: Coping: Goal: Ability to adjust to condition or change in health will improve Outcome: Progressing   Problem: Fluid Volume: Goal: Ability to maintain a balanced intake and output will improve Outcome: Progressing   Problem: Health Behavior/Discharge Planning: Goal: Ability to identify and utilize available resources and services will improve Outcome: Progressing Goal: Ability to manage health-related needs will improve Outcome: Progressing   Problem: Metabolic: Goal: Ability to maintain appropriate glucose levels will improve Outcome: Progressing   Problem: Nutritional: Goal: Maintenance of adequate nutrition will improve Outcome: Progressing Goal: Progress toward achieving an optimal weight will improve Outcome: Progressing   Problem: Skin Integrity: Goal: Risk for impaired skin integrity will decrease Outcome: Progressing   Problem: Tissue Perfusion: Goal: Adequacy of tissue perfusion will improve Outcome: Progressing   Problem: Education: Goal: Knowledge of General Education information will improve Description: Including pain rating scale, medication(s)/side effects and non-pharmacologic comfort measures Outcome: Progressing   Problem: Health Behavior/Discharge Planning: Goal: Ability to manage health-related needs will improve Outcome: Progressing   Problem: Clinical Measurements: Goal: Ability to maintain clinical measurements within normal limits will improve Outcome: Progressing Goal: Will remain free from infection Outcome: Progressing Goal: Diagnostic test results will improve Outcome: Progressing Goal: Respiratory complications will improve Outcome: Progressing Goal: Cardiovascular complication will  be avoided Outcome: Progressing   Problem: Activity: Goal: Risk for activity intolerance will decrease Outcome: Progressing   Problem: Nutrition: Goal: Adequate nutrition will be maintained Outcome: Progressing   Problem: Coping: Goal: Level of anxiety will decrease Outcome: Progressing   Problem: Elimination: Goal: Will not experience complications related to bowel motility Outcome: Progressing Goal: Will not experience complications related to urinary retention Outcome: Progressing   Problem: Pain Managment: Goal: General experience of comfort will improve Outcome: Progressing   Problem: Safety: Goal: Ability to remain free from injury will improve Outcome: Progressing   Problem: Skin Integrity: Goal: Risk for impaired skin integrity will decrease Outcome: Progressing   Problem: Safety: Goal: Non-violent Restraint(s) Outcome: Progressing   

## 2022-10-07 NOTE — Progress Notes (Signed)
Pt ambulated to door way screaming  for his nurse pt ambulated back to bed after this nurse was able to calm him down. Reva Bores 10/07/22 7:16 PM
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
## 2022-10-08 DIAGNOSIS — J9622 Acute and chronic respiratory failure with hypercapnia: Secondary | ICD-10-CM | POA: Diagnosis not present

## 2022-10-08 DIAGNOSIS — J9621 Acute and chronic respiratory failure with hypoxia: Secondary | ICD-10-CM | POA: Diagnosis not present

## 2022-10-08 MED ORDER — PREDNISONE 20 MG PO TABS: 20.0000 mg | ORAL_TABLET | Freq: Every day | ORAL | 0 refills | Status: DC

## 2022-10-08 MED ORDER — PANTOPRAZOLE SODIUM 40 MG PO TBEC: 40.0000 mg | DELAYED_RELEASE_TABLET | Freq: Every day | ORAL | 0 refills | Status: DC

## 2022-10-08 MED ORDER — MIDODRINE HCL 10 MG PO TABS: 10.0000 mg | ORAL_TABLET | Freq: Two times a day (BID) | ORAL | 0 refills | Status: DC

## 2022-10-08 MED ORDER — PREDNISONE 10 MG PO TABS: 10.0000 mg | ORAL_TABLET | Freq: Every day | ORAL | 0 refills | Status: DC

## 2022-10-08 MED ORDER — PREDNISONE 10 MG PO TABS: 30.0000 mg | ORAL_TABLET | Freq: Every day | ORAL | 0 refills | Status: DC

## 2022-10-08 NOTE — TOC Transition Note (Addendum)
Transition of Care [facility]) - CM/SW Discharge Note   Patient Details  Name: Gary [name] MRN: [id number] Date of Birth: 31-Jan-1956  [facility] CM/SW Contact:  Margarito Liner, LCSW Phone Number: 10/08/2022, 1:16 PM   Clinical Narrative:   Patient has orders to discharge home today. EMS transport has been called and he is first on the list. No further concerns. CSW signing off.  2:17 pm: EMS will not transport patient after he yelled and cussed at them, per RN. Patient is upset they cannot take his many bags with them in the ambulance. Patient has called his cousin Gary [name] who will come pick up his belongings and take him home.  Final next level of care: Home w Home Health Services Barriers to Discharge: Barriers Resolved   Patient Goals and CMS Choice      Discharge Placement                  Patient to be transferred to facility by: EMS   Patient and family notified of of transfer: 10/08/22  Discharge Plan and Services Additional resources added to the After Visit Summary for                            [facility] Arranged: PT, OT [facility] Agency: [facility] Date [facility] Agency Contacted: 10/08/22   Representative spoke with at [facility] Agency: Lorenza Chick  Social Determinants of Health (SDOH) Interventions SDOH Screenings   Food Insecurity: No Food Insecurity (09/07/2022)  Housing: Patient Declined (09/07/2022)  Transportation Needs: No Transportation Needs (09/12/2022)  Recent Concern: Transportation Needs - Unmet Transportation Needs (08/13/2022)  Utilities: Not At Risk (08/13/2022)  Depression (PHQ2-9): Low Risk  (06/07/2021)  Financial Resource Strain: Medium Risk (05/03/2017)  Physical Activity: Insufficiently Active (05/03/2017)  Social Connections: Moderately Integrated (05/03/2017)  Stress: No Stress Concern Present (05/03/2017)  Tobacco Use: Medium Risk (09/27/2022)     Readmission Risk Interventions    12/31/2021    8:58 AM 12/20/2021    4:21 PM  05/04/2021    1:26 PM  Readmission Risk Prevention Plan  Transportation Screening Complete Complete Complete  Medication Review Oceanographer) Complete Complete Complete  PCP or Specialist appointment within 3-5 days of discharge Complete Complete Complete  HRI or Home Care Consult Complete Complete Complete  SW Recovery Care/Counseling Consult Complete Complete Complete  Palliative Care Screening Complete Complete Not Applicable  Skilled Nursing Facility Not Applicable Not Applicable Not Applicable

## 2022-10-08 NOTE — Care Management Important Message (Signed)
Important Message  Patient Details  Name: Gary [name] MRN: [id number] Date of Birth: 1955-05-21   Medicare Important Message Given:  Yes     [name] 10/08/2022, 12:00 PM

## 2022-10-08 NOTE — Progress Notes (Signed)
Physical Therapy Treatment Patient Details Name: Gary [name] MRN: [id number] DOB: [birth year] Today's Date: 10/08/2022   History of Present Illness Pt is a 67 y/o M admitted on 09/26/22 for tx of acute on chronic respiratory failure with hypoxia & hypercapnia, possible CAP. PMH: morbid obesity, chronic trach/trach collar dependent, trilogy vent at night, lower GI bleed, chronic hypoxic & hypercapnic respiratory failure due to OSA, OHS, COPD, HTN, dyslipidemia, chronic    PT Comments    Patient is agreeable to PT. He was seated on the side of the bed and requesting for therapist to assist him to the transport chair in preparation for discharge. After first stand, the bed moved while patient was standing and patient felt he needed to get back into bed for repositioning and to prevent slipping off the edge of bed. After short rest break in bed and cues to focus on breathing techniques, patient was able to stand with supervision with bed elevated and perform step transfer to transport chair. Cues for safety and technique. Activity tolerance continues to be limited by fatigue.  The patient reports he is going home today. He  will need frequent supervision/assistance if going home. Recommend to continue PT after this hospital stay.    Recommendations for follow up therapy are one component of a multi-disciplinary discharge planning process, led by the attending physician.  Recommendations may be updated based on patient status, additional functional criteria and insurance authorization.  Follow Up Recommendations       Assistance Recommended at Discharge Frequent or constant Supervision/Assistance  Patient can return home with the following A lot of help with walking and/or transfers;A lot of help with bathing/dressing/bathroom;Assistance with cooking/housework;Assistance with feeding;Direct supervision/assist for medications management;Direct supervision/assist for financial management;Assist for  transportation;Help with stairs or ramp for entrance   Equipment Recommendations  Wheelchair (measurements PT);Wheelchair cushion (measurements PT);Hospital bed;BSC/3in1;Rolling walker (2 wheels)    Recommendations for Other Services       Precautions / Restrictions Precautions Precautions: Fall Restrictions Weight Bearing Restrictions: No     Mobility  Bed Mobility Overal bed mobility: Needs Assistance Bed Mobility: Supine to Sit, Sit to Supine     Supine to sit: Supervision Sit to supine: Mod assist   General bed mobility comments: patient seated on edge of bed on arrival to room. Assistance required for BLE support to return to bed as patient felt he was slipping off the bed. encouraged patient to rest for several minutes before sitting back up. supervision for supine to sitting. cues for technique    Transfers Overall transfer level: Needs assistance Equipment used: Rolling walker (2 wheels) Transfers: Bed to chair/wheelchair/BSC, Sit to/from Stand Sit to Stand: Supervision, From elevated surface   Step pivot transfers: Supervision, +2 safety/equipment       General transfer comment: with the first stand, bed was not locked in place and began to move. with the second standing bout, patient stood from the bed and took several steps to the trasnport chair in preparation for discharge today. cues provided for technique and safe hand placement    Ambulation/Gait               General Gait Details: not tested today   Stairs             Wheelchair Mobility    Modified Rankin (Stroke Patients Only)       Balance Overall balance assessment: Needs assistance Sitting-balance support: Feet supported Sitting balance-[name] Scale: Good     Standing balance support:  Bilateral upper extremity supported Standing balance-[name] Scale: Fair Standing balance comment: with RW for support in standing                            Cognition  Arousal/Alertness: Awake/alert Behavior During Therapy: Anxious Overall Cognitive Status: Within Functional Limits for tasks assessed                                 General Comments: patient is cooperative during this session. he is anxious at times with cues provided for breathing techniques as patient has dyspnea with exertion at times        Exercises      General Comments General comments (skin integrity, edema, etc.): patient seated in transport chair with multiple staff members present at end of session      Pertinent Vitals/Pain Pain Assessment Pain Assessment: No/denies pain    Home Living                          Prior Function            PT Goals (current goals can now be found in the care plan section) Acute Rehab PT Goals Patient Stated Goal: to go home now PT Goal Formulation: With patient Time For Goal Achievement: 10/14/22 Potential to Achieve Goals: Fair Progress towards PT goals: Progressing toward goals    Frequency    Min 3X/week      PT Plan Current plan remains appropriate    Co-evaluation              AM-PAC PT "6 Clicks" Mobility   Outcome Measure  Help needed turning from your back to your side while in a flat bed without using bedrails?: A Little Help needed moving from lying on your back to sitting on the side of a flat bed without using bedrails?: A Little Help needed moving to and from a bed to a chair (including a wheelchair)?: A Lot Help needed standing up from a chair using your arms (e.g., wheelchair or bedside chair)?: A Lot Help needed to walk in hospital room?: A Lot Help needed climbing 3-5 steps with a railing? : Total 6 Click Score: 13    End of Session Equipment Utilized During Treatment: Oxygen (trach mask oxygen) Activity Tolerance: Patient tolerated treatment well Patient left: in chair;with nursing/sitter in room Nurse Communication: Mobility status PT Visit Diagnosis: Muscle  weakness (generalized) (M62.81);Difficulty in walking, not elsewhere classified (R26.2)     Time: 1425-1440 PT Time Calculation (min) (ACUTE ONLY): 15 min  Charges:  $Therapeutic Activity: 8-22 mins                     [name], PT, MPT    [name] 10/08/2022, 3:25 PM

## 2022-10-08 NOTE — Discharge Summary (Signed)
Physician Discharge Summary   Patient: Gary [name] MRN: [id number] DOB: 1955-05-30  Admit date:     09/26/2022  Discharge date: 10/08/22  Discharge Physician: Loyce Dys   PCP: Eloisa Northern, MD    Discharge Diagnoses:  Acute on chronic respiratory failure with hypoxia and hypercapnia ([name]) Pseudomonas colonization Possible CAP Chronic tracheostomy Nocturnal ventilation dependent OSA/OHS Anticoagulated Chronic DVT Acute on chronic diastolic (congestive) heart failure ([name]) OSA (obstructive sleep apnea) Obesity hypoventilation syndrome AKI on chronic kidney disease, stage 3a ([name]) Renal function normalized  Morbid obesity with BMI of 50.0-59.9, adult ([name]) Ambulatory dysfunction Iron deficiency anemia Anemia of chronic disease Acute metabolic encephalopathy Pressure injury of skin  Hospital Course:  Gary [name] is a 67 y.o. male with medical history significant of Gary [name]  is a 67 y.o. male, with history of morbid obesity BMI of greater than 40, chronic trach collar dependent on trilogy vent at night, history of lower GI bleed in the proximal sigmoid colon requiring embolization in 2023, chronic hypoxic and hypercapnic respiratory failure due to OSA, OHS, COPD, hypertension, dyslipidemia, chronic diastolic CHF EF 65%, cKd stage IIIa baseline creatinine close to 1.3, depression, chronic DVT on Eliquis. Recent admision on 09/07/2022 for BRBPR  s/p EGD and colonscopy. S.p. augmentin for divertifulitis.  Patient was discharged on Sep 12, 2022 to [facility] inpatient rehab. Pt presented to our ED on evening of 09/26/22, reported having just been discharged from rehab that day.  Reported a cough and thick mucus from his trach.  During EMS transport from rehab to home, he had sudden shortness of breath and severe coughing fit, so he was brought to the ED for evaluation.  Initially O2 sats stabilized on trach collar O2, but due to recurrent desaturations and severe hypercapnea, he  required admission and placement in mechanical ventilation.  Was seen by intensivist and pulmonologist and patient was weaned off mechanical ventilation appropriately and transition to his regular noninvasive ventilator.  Patient's secretions improved once found to be growing Pseudomonas in his sputum and completed antibiotic course.  Patient was recommended to go back to rehab however he opted to go home with home health services in place.  Patient will need to follow-up with his primary care physician   Consultants: Pulmonologist/intensivist Procedures performed: Mechanical ventilation Disposition: Home health Diet recommendation:  Discharge Diet Orders (From admission, onward)     Start     Ordered   10/08/22 0000  Diet - low sodium heart healthy        10/08/22 1049           Cardiac diet DISCHARGE MEDICATION: Allergies as of 10/08/2022   No Known Allergies      Medication List     TAKE these medications    acetaminophen 325 MG tablet Commonly known as: TYLENOL Take 2 tablets (650 mg total) by mouth every 6 (six) hours as needed for mild pain (or Fever >/= 101).   apixaban 2.5 MG Tabs tablet Commonly known as: ELIQUIS Take 1 tablet (2.5 mg total) by mouth 2 (two) times daily.   docusate sodium 100 MG capsule Commonly known as: COLACE Take 1 capsule (100 mg total) by mouth daily.   ferrous sulfate 325 (65 FE) MG tablet Take 1 tablet (325 mg total) by mouth daily with breakfast. Home med   furosemide 80 MG tablet Commonly known as: Lasix Take 1 tablet (80 mg total) by mouth 2 (two) times daily.   ipratropium-albuterol 0.5-2.5 (3) MG/3ML Soln Commonly known  as: DUONEB Take 3 mLs by nebulization every 6 (six) hours as needed.   mesalamine 1.2 g EC tablet Commonly known as: LIALDA Take 2 tablets (2.4 g total) by mouth 2 (two) times daily.   midodrine 10 MG tablet Commonly known as: PROAMATINE Take 1 tablet (10 mg total) by mouth 2 (two) times daily with a  meal.   pantoprazole 40 MG tablet Commonly known as: PROTONIX Take 1 tablet (40 mg total) by mouth at bedtime.   polyethylene glycol 17 g packet Commonly known as: MIRALAX / GLYCOLAX Take 17 g by mouth daily as needed for moderate constipation.   predniSONE 10 MG tablet Commonly known as: DELTASONE Take 3 tablets (30 mg total) by mouth daily with breakfast. Start taking on: October 09, 2022   predniSONE 20 MG tablet Commonly known as: DELTASONE Take 1 tablet (20 mg total) by mouth daily with breakfast. Start taking on: October 12, 2022   predniSONE 10 MG tablet Commonly known as: DELTASONE Take 1 tablet (10 mg total) by mouth daily with breakfast. Start taking on: October 15, 2022               Durable Medical Equipment  (From admission, onward)           Start     Ordered   10/07/22 1701  For home use only DME Other see comment  Once       Comments: Bed pads and wipes  Question:  Length of Need  Answer:  6 Months   10/07/22 1700              Discharge Care Instructions  (From admission, onward)           Start     Ordered   10/08/22 0000  No dressing needed        10/08/22 1049            Discharge Exam: Filed Weights   10/01/22 0446 10/02/22 0247 10/03/22 0244  Weight: (!) 199.5 kg (!) 221.5 kg (!) 218.4 kg   General exam morbidly obese male in no acute distress on trach collar HEENT: Trach collar in place, hearing grossly normal  Respiratory system: Transmitted sounds Cardiovascular system: normal S1/S2, RRR trace bilateral lower extremity edema Gastrointestinal system: soft, NT, ND, no HSM felt Central nervous system: grossly non-focal exam, normal speech, A&Ox3 Extremities: moves all, trace BLE edema, normal tone Skin: dry, intact, normal temperature Psychiatry: normal mood, flat affect  Condition at discharge: good    Discharge time spent: 40 minutes  Signed: Loyce Dys, MD [facility] 10/08/2022

## 2022-10-08 NOTE — Progress Notes (Addendum)
Patient was discharged for home health, while trying to get him ready, he refused to change to paper gown. Patient refused to discharge without his belongings with him during EMS transport. Patient  yelling at nursing staff  and throwing objects. Security contacted [facility]2 and Larena Sox  the [facility] was contacted. EMS did not take patient due to patient yelling at EMS staff. Patient calls family member to pick him up. Security and nursing staff assisted with taking his belongings with him to the medical mall upon waiting for his family member to pick him up.

## 2022-10-08 NOTE — Progress Notes (Signed)
Took patient off home vent and placed back on 28% trach collar for the day.

## 2022-10-08 NOTE — TOC Progression Note (Addendum)
Transition of Care [facility]) - Progression Note    Patient Details  Name: Gary [name] MRN: [id number] Date of Birth: 08-03-1955  [facility] CM/SW Contact  Margarito Liner, LCSW Phone Number: 10/08/2022, 11:57 AM  Clinical Narrative: [facility]ond[facility] Jumbo Private Duty Nursing liaison of plan for discharge today. Will fax discharge summary once available. CSW called [facility] who stated they set him up with [facility] for therapy prior to discharge from their facility. Liaison confirmed they can still accept for PT and OT but the private duty nurse will need to provide the wound care. CSW notified [name] of time lapse before private duty nursing can start. They cannot see trach patient without the adult RN/trach nurse present so they will coordinate visits with them. CSW notified [employer] liaison that patient is discharging today and left a voicemail for their respiratory therapist to notify. Will set up EMS transport home once home health orders and discharge summary are in. Patient confirmed address on facesheet is correct and someone will be there to let him in. Patient wants update on incident from yesterday or he says he's calling [facility]. No unit leadership here at this time so notified charge RN.    12:58 pm: Faxed discharge summary to [facility].  Expected Discharge Plan:  (TBD) Barriers to Discharge: Continued Medical Work up  Expected Discharge Plan and Services         Expected Discharge Date: 10/08/22                                     Social Determinants of Health (SDOH) Interventions SDOH Screenings   Food Insecurity: No Food Insecurity (09/07/2022)  Housing: Patient Declined (09/07/2022)  Transportation Needs: No Transportation Needs (09/12/2022)  Recent Concern: Transportation Needs - Unmet Transportation Needs (08/13/2022)  Utilities: Not At Risk (08/13/2022)  Depression (PHQ2-9): Low Risk  (06/07/2021)   Financial Resource Strain: Medium Risk (05/03/2017)  Physical Activity: Insufficiently Active (05/03/2017)  Social Connections: Moderately Integrated (05/03/2017)  Stress: No Stress Concern Present (05/03/2017)  Tobacco Use: Medium Risk (09/27/2022)    Readmission Risk Interventions    12/31/2021    8:58 AM 12/20/2021    4:21 PM 05/04/2021    1:26 PM  Readmission Risk Prevention Plan  Transportation Screening Complete Complete Complete  Medication Review Oceanographer) Complete Complete Complete  PCP or Specialist appointment within 3-5 days of discharge Complete Complete Complete  HRI or Home Care Consult Complete Complete Complete  SW Recovery Care/Counseling Consult Complete Complete Complete  Palliative Care Screening Complete Complete Not Applicable  Skilled Nursing Facility Not Applicable Not Applicable Not Applicable

## 2022-10-09 ENCOUNTER — Other Ambulatory Visit: Payer: Self-pay

## 2022-10-09 ENCOUNTER — Emergency Department
Admission: EM | Admit: 2022-10-09 | Discharge: 2022-10-12 | Disposition: A | Discharge status: Home / Self Care | Payer: Medicare Other | Attending: Emergency Medicine | Admitting: Emergency Medicine

## 2022-10-09 ENCOUNTER — Encounter: Payer: Self-pay | Admitting: Emergency Medicine

## 2022-10-09 DIAGNOSIS — N1831 Chronic kidney disease, stage 3a: Secondary | ICD-10-CM | POA: Diagnosis not present

## 2022-10-09 DIAGNOSIS — I5033 Acute on chronic diastolic (congestive) heart failure: Secondary | ICD-10-CM | POA: Diagnosis not present

## 2022-10-09 DIAGNOSIS — J9611 Chronic respiratory failure with hypoxia: Secondary | ICD-10-CM | POA: Diagnosis not present

## 2022-10-09 DIAGNOSIS — Z7901 Long term (current) use of anticoagulants: Secondary | ICD-10-CM | POA: Insufficient documentation

## 2022-10-09 DIAGNOSIS — Z8616 Personal history of COVID-19: Secondary | ICD-10-CM | POA: Diagnosis not present

## 2022-10-09 DIAGNOSIS — J9612 Chronic respiratory failure with hypercapnia: Secondary | ICD-10-CM | POA: Diagnosis not present

## 2022-10-09 DIAGNOSIS — R059 Cough, unspecified: Secondary | ICD-10-CM | POA: Diagnosis present

## 2022-10-09 DIAGNOSIS — Z86718 Personal history of other venous thrombosis and embolism: Secondary | ICD-10-CM | POA: Diagnosis not present

## 2022-10-09 DIAGNOSIS — I130 Hypertensive heart and chronic kidney disease with heart failure and stage 1 through stage 4 chronic kidney disease, or unspecified chronic kidney disease: Secondary | ICD-10-CM | POA: Insufficient documentation

## 2022-10-09 DIAGNOSIS — J449 Chronic obstructive pulmonary disease, unspecified: Secondary | ICD-10-CM | POA: Diagnosis not present

## 2022-10-09 LAB — CBC WITH DIFFERENTIAL/PLATELET
Abs Immature Granulocytes: 0.06 10*3/uL (ref 0.00–0.07)
Basophils Absolute: 0 10*3/uL (ref 0.0–0.1)
Basophils Relative: 0 %
Eosinophils Absolute: 0.1 10*3/uL (ref 0.0–0.5)
Eosinophils Relative: 0 %
HCT: 37.8 % — ABNORMAL LOW (ref 39.0–52.0)
Hemoglobin: 9.8 g/dL — ABNORMAL LOW (ref 13.0–17.0)
Immature Granulocytes: 0 %
Lymphocytes Relative: 10 %
Lymphs Abs: 1.4 10*3/uL (ref 0.7–4.0)
MCH: 20.9 pg — ABNORMAL LOW (ref 26.0–34.0)
MCHC: 25.9 g/dL — ABNORMAL LOW (ref 30.0–36.0)
MCV: 80.8 fL (ref 80.0–100.0)
Monocytes Absolute: 1.5 10*3/uL — ABNORMAL HIGH (ref 0.1–1.0)
Monocytes Relative: 11 %
Neutro Abs: 10.6 10*3/uL — ABNORMAL HIGH (ref 1.7–7.7)
Neutrophils Relative %: 79 %
Platelets: 229 10*3/uL (ref 150–400)
RBC: 4.68 MIL/uL (ref 4.22–5.81)
RDW: 18.6 % — ABNORMAL HIGH (ref 11.5–15.5)
WBC: 13.6 10*3/uL — ABNORMAL HIGH (ref 4.0–10.5)
nRBC: 0 % (ref 0.0–0.2)

## 2022-10-09 LAB — BASIC METABOLIC PANEL
Anion gap: 6 (ref 5–15)
BUN: 34 mg/dL — ABNORMAL HIGH (ref 8–23)
CO2: 31 mmol/L (ref 22–32)
Calcium: 7.7 mg/dL — ABNORMAL LOW (ref 8.9–10.3)
Chloride: 105 mmol/L (ref 98–111)
Creatinine, Ser: 0.95 mg/dL (ref 0.61–1.24)
GFR, Estimated: >60 mL/min (ref >60)
Glucose, Bld: 89 mg/dL (ref 70–99)
Potassium: 4.4 mmol/L (ref 3.5–5.1)
Sodium: 142 mmol/L (ref 135–145)

## 2022-10-09 MED ORDER — PREDNISONE 10 MG PO TABS: 10.0000 mg | ORAL_TABLET | Freq: Every day | ORAL | Status: DC

## 2022-10-09 MED ORDER — MIDODRINE HCL 5 MG PO TABS
10.0000 mg | ORAL_TABLET | Freq: Two times a day (BID) | ORAL | Status: DC
  Administered 2022-10-09 – 2022-10-12 (×6): 10 mg via ORAL
  Filled 2022-10-09 (×6): qty 2

## 2022-10-09 MED ORDER — FUROSEMIDE 40 MG PO TABS
80.0000 mg | ORAL_TABLET | Freq: Two times a day (BID) | ORAL | Status: DC
  Administered 2022-10-09 – 2022-10-12 (×6): 80 mg via ORAL
  Filled 2022-10-09 (×6): qty 2

## 2022-10-09 MED ORDER — PREDNISONE 20 MG PO TABS
20.0000 mg | ORAL_TABLET | Freq: Every day | ORAL | Status: DC
  Administered 2022-10-12: 20 mg via ORAL
  Filled 2022-10-09: qty 1

## 2022-10-09 MED ORDER — APIXABAN 2.5 MG PO TABS
2.5000 mg | ORAL_TABLET | Freq: Two times a day (BID) | ORAL | Status: DC
  Administered 2022-10-09 – 2022-10-12 (×6): 2.5 mg via ORAL
  Filled 2022-10-09 (×6): qty 1

## 2022-10-09 MED ORDER — MELATONIN 5 MG PO TABS
2.5000 mg | ORAL_TABLET | Freq: Every day | ORAL | Status: DC
  Administered 2022-10-09 – 2022-10-11 (×3): 2.5 mg via ORAL
  Filled 2022-10-09 (×3): qty 1

## 2022-10-09 MED ORDER — PREDNISONE 20 MG PO TABS
30.0000 mg | ORAL_TABLET | Freq: Every day | ORAL | Status: AC
  Administered 2022-10-09 – 2022-10-11 (×3): 30 mg via ORAL
  Filled 2022-10-09 (×3): qty 1

## 2022-10-09 NOTE — ED Notes (Signed)
Pt sleeping, in no distress.
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
## 2022-10-09 NOTE — ED Triage Notes (Signed)
Patient to ED via [facility] from home for placement. Patient was dc'd from the 2nd floor yesterday- unable to care for self. Was suppose to have home health care but won't be coming out for approx 2 weeks. Unable to walk or get up at this time. Girlfriend has the paperwork to set up home health but has not contacted them yet.

## 2022-10-09 NOTE — ED Notes (Signed)
Pt called out from room, writer went into room with NT Sage, pt requesting more suction catheter tubing. Writer is unclear about pt care plan. RN notified of this interaction.

## 2022-10-09 NOTE — ED Notes (Signed)
Rt called but no answer at this time. Will try again later

## 2022-10-10 DIAGNOSIS — J9611 Chronic respiratory failure with hypoxia: Secondary | ICD-10-CM | POA: Diagnosis not present

## 2022-10-10 NOTE — ED Notes (Signed)
Patient given night medication and all needs are met at this time. Patient has had two urine outputs since my last note.

## 2022-10-10 NOTE — ED Notes (Signed)
Patient needs met at this time. Patient was given a sandwich, and a drink.

## 2022-10-10 NOTE — ED Notes (Signed)
Patient was given peanut butter and crackers at this time. 

## 2022-10-10 NOTE — ED Notes (Signed)
Patient yelling out to any persons walking by the room with multiple request and being demanding with the same. Patient does not ask the staff, he tells the staff. Patient was assisted up to sit at the bedside. Patient knocked his urinal over and urine spilled on the floor. Patient said "that's why I told yall to empty it". Patient did not request for any staff to empty the urinal since the last time this nurse emptied it. Patient pointing at things on bedside table saying "throw that away". Patient was instructed to refrain from bossing this nurse around. Pt stated "come on you're a man aint ya". Patient informed this nurse is a professional and will not tolerate that behavior and to utilize his call light and refrain from yelling. Tammy Sours, ED Director and Marylene Land, Charge RN informed of the same. Bariatric bedside recliner placed in room.

## 2022-10-10 NOTE — ED Notes (Signed)
Patient has been given multiple snacks and drinks while in the emergency department.

## 2022-10-10 NOTE — ED Notes (Signed)
Patient provided drink, crackers and peanut butter. CB in reach and instructed to use it for assistance.

## 2022-10-11 DIAGNOSIS — J9611 Chronic respiratory failure with hypoxia: Secondary | ICD-10-CM | POA: Diagnosis not present

## 2022-10-11 NOTE — Evaluation (Signed)
Occupational Therapy Evaluation Patient Details Name: Gary [name] MRN: [id number] DOB: 1955-05-21 Today's Date: 10/11/2022   History of Present Illness Gary [name] is a 67 y.o. 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 Pt was just discharged from the hospital yesterday afternoon.  He was admitted due to increased cough and increased O2 requirement   Clinical Impression   Gary [name] was seen for OT/PT co-evaluation this date. Pt familiar to caseload and has been unable to complete ADLs at home (LB dressing, bathing, pericare) or in the hospital for the last year. Pt currently requires MAX A doff B socks and don B shoes seated EOB. SBA + RW sit<>stand x2 from elevated bed height and toilet t/f. Pt attempted urinating in standing, fair static balance. Anticipate pt is near his baseline for ADLs, will require assistance upon d/c. Pt would benefit from skilled OT to address noted impairments and functional limitations (see below for any additional details). Upon hospital discharge, recommend OT follow up.    Recommendations for follow up therapy are one component of a multi-disciplinary discharge planning process, led by the attending physician.  Recommendations may be updated based on patient status, additional functional criteria and insurance authorization.   Assistance Recommended at Discharge Frequent or constant Supervision/Assistance  Patient can return home with the following A lot of help with walking and/or transfers;A lot of help with bathing/dressing/bathroom;Help with stairs or ramp for entrance    Functional Status Assessment  Patient has had a recent decline in their functional status and demonstrates the ability to make  significant improvements in function in a reasonable and predictable amount of time.  Equipment Recommendations  Hospital bed    Recommendations for Other Services       Precautions / Restrictions Precautions Precautions: Fall Precaution Comments: trach Restrictions Weight Bearing Restrictions: No      Mobility Bed Mobility Overal bed mobility: Needs Assistance Bed Mobility: Supine to Sit     Supine to sit: Min guard          Transfers Overall transfer level: Needs assistance Equipment used: Rolling walker (2 wheels) Transfers: Bed to chair/wheelchair/BSC, Sit to/from Stand Sit to Stand: Min guard, From elevated surface     Step pivot transfers: Min guard, From elevated surface            Balance Overall balance assessment: Needs assistance Sitting-balance support: Feet supported Sitting balance-[name] Scale: Normal     Standing balance support: Bilateral upper extremity supported Standing balance-[name] Scale: Fair                             ADL either performed or assessed with clinical judgement   ADL Overall ADL's : Needs assistance/impaired                                       General ADL Comments: MAX A doff B socks and don B shoes seated EOB. SBA + RW toilet t/f and attempted urinating in standing, pt states unable to urinate.                  Pertinent Vitals/Pain Pain Assessment Pain Assessment: No/denies pain  Hand Dominance Right   Extremity/Trunk Assessment Upper Extremity Assessment Upper Extremity Assessment: Overall WFL for tasks assessed   Lower Extremity Assessment Lower Extremity Assessment: Generalized weakness       Communication Communication Communication: Passy-Muir valve;No difficulties   Cognition Arousal/Alertness: Awake/alert Behavior During Therapy: WFL for tasks assessed/performed Overall Cognitive Status: Within Functional Limits for tasks assessed                                        General Comments  O2 doffed during toilet t/f, SPO2 mid 80s, resolved in sitting with 5L [place]    Exercises     Shoulder Instructions      Home Living Family/patient expects to be discharged to:: Private residence   Available Help at Discharge: Family;Available PRN/intermittently Type of Home: Apartment Home Access: Level entry;Ramped entrance     Home Layout: One level     Bathroom Shower/Tub: Chief Strategy Officer: Handicapped height     Home Equipment: Cane - single point;Wheelchair - Nurse, children's (2 wheels);Hospital bed          Prior Functioning/Environment Prior Level of Function : Needs assist             Mobility Comments: Difficulty standing from standard height surfaces however once up can walk ~50 ft MOD I. ADLs Comments: assist for LB dressing, toileting, and IADLs        OT Problem List: Decreased strength;Decreased range of motion;Decreased activity tolerance;Impaired balance (sitting and/or standing);Decreased safety awareness      OT Treatment/Interventions: Self-care/ADL training;Therapeutic exercise;Energy conservation;DME and/or AE instruction;Therapeutic activities;Patient/family education;Balance training    OT Goals(Current goals can be found in the care plan section) Acute Rehab OT Goals Patient Stated Goal: to walk OT Goal Formulation: With patient Time For Goal Achievement: 10/15/22 Potential to Achieve Goals: Good ADL Goals Pt Will Perform Grooming: with modified independence;standing Pt Will Transfer to Toilet: with modified independence;ambulating;regular height toilet Pt Will Perform Toileting - Clothing Manipulation and hygiene: with modified independence;sit to/from stand  OT Frequency: Min 1X/week    Co-evaluation PT/OT/SLP Co-Evaluation/Treatment: Yes Reason for Co-Treatment: Complexity of the patient's impairments (multi-system involvement);For patient/therapist  safety;To address functional/ADL transfers PT goals addressed during session: Mobility/safety with mobility;Balance;Proper use of DME;Strengthening/ROM OT goals addressed during session: ADL's and self-care      AM-PAC OT "6 Clicks" Daily Activity     Outcome Measure Help from another person eating meals?: None Help from another person taking care of personal grooming?: None Help from another person toileting, which includes using toliet, bedpan, or urinal?: A Lot Help from another person bathing (including washing, rinsing, drying)?: A Lot Help from another person to put on and taking off regular upper body clothing?: A Little Help from another person to put on and taking off regular lower body clothing?: A Lot 6 Click Score: 17   End of Session Equipment Utilized During Treatment: Rolling walker (2 wheels) Nurse Communication: Mobility status;Need for lift equipment  Activity Tolerance: Patient tolerated treatment well Patient left: in chair;with call bell/phone within reach  OT Visit Diagnosis: Unsteadiness on feet (R26.81);Muscle weakness (generalized) (M62.81)                Time: 2536-6440 OT Time Calculation (min): 32 min Charges:  OT General Charges $OT Visit: 1 Visit OT Evaluation $OT Eval Low Complexity: 1 Low  Kathie Dike, M.S. OTR/L  10/11/22, 2:24  PM  ascom [phone]

## 2022-10-11 NOTE — TOC Initial Note (Signed)
Transition of Care [facility]) - Initial/Assessment Note    Patient Details  Name: Gary [name] MRN: [id number] Date of Birth: 12/27/1955  Transition of Care [facility]) CM/SW Contact:    [name], LCSW Phone Number: 10/11/2022, 10:56 AM  Clinical Narrative:                  Patient recently discharged home 6/24 with [name], all dme including NIV.   CSW notes that patient was brought to ED reporting that home care was not able to come out for 2 weeks. CSW followed up with Gary [name] PDN Rob, he reports patient's girlfriend never completed the paperwork to get it started. CSW notes that in ED notation it is noted that girlfriend has the paperwork to set up home health but had not contacted them yet. This is the reason for no home services at this time.   TOC will follow if dc needs arise.        Patient Goals and CMS Choice            Expected Discharge Plan and Services                                              Prior Living Arrangements/Services                       Activities of Daily Living      Permission Sought/Granted                  Emotional Assessment              Admission diagnosis:  placement ems Patient Active Problem List   Diagnosis Date Noted   Anemia of chronic disease 09/27/2022   Acute respiratory failure ([name]) 09/26/2022   Acute metabolic encephalopathy 09/26/2022   Chronic colitis 09/12/2022   Segmental colitis associated with diverticulosis ([name]) 09/12/2022   Diverticulosis of colon with hemorrhage 09/10/2022   Abnormal CT scan, gastrointestinal tract 09/10/2022   Anemia, posthemorrhagic, acute 09/08/2022   Lower GI bleed 09/07/2022   Chest pain 08/13/2022   Acute on chronic heart failure with preserved ejection fraction (HFpEF) ([name]) 08/12/2022   Acute exacerbation of CHF (congestive heart failure) ([name]) 04/11/2022   Influenza A 04/11/2022   Hypokalemia 03/06/2022   Acute on chronic diastolic  (congestive) heart failure ([name]) 02/03/2022   Chronic obstructive pulmonary disease (COPD) ([name]) 02/03/2022   GERD without esophagitis 02/03/2022   Anxiety and depression 02/03/2022   Dyslipidemia 02/03/2022   Hematochezia    Bloody stool 11/12/2021   Major depressive disorder, recurrent episode, moderate ([name]) 10/22/2021   Pressure injury of skin 09/27/2021   Acute on chronic respiratory failure with hypercapnia ([name]) 08/07/2021   COPD (chronic obstructive pulmonary disease) ([name])    HLD (hyperlipidemia)    Iron deficiency anemia    Chronic kidney disease, stage 3a ([name]) 07/22/2021   Acute on chronic respiratory failure with hypoxia and hypercapnia ([name]) 07/20/2021   Chronic obstructive pulmonary disease ([name])    Acute CHF (congestive heart failure) ([name]) 04/04/2021   Weakness    Acute respiratory failure with hypoxia and hypercapnia ([name]) 03/11/2021   (HFpEF) heart failure with preserved ejection fraction ([name])    Chronic respiratory failure with hypoxia ([name])    Hyperlipidemia 02/19/2021   Acute respiratory distress syndrome (ARDS) due to COVID-19 virus (  [name])    Acute on chronic respiratory failure ([name]) 02/18/2021   Generalized osteoarthritis of multiple sites 10/31/2020   AKI (acute kidney injury) ([name]) 03/04/2020   Hyperkalemia 03/04/2020   COPD with acute exacerbation ([name]) 03/02/2020   Marijuana use 01/17/2020   Thrombocytopenia ([name]) 01/17/2020   Positive hepatitis C antibody test 01/17/2020   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 mass index (BMI) of 50.0 to 59.9 in adult [facility]) 01/17/2020   Osteoarthritis of glenohumeral joints (Bilateral) 01/17/2020   Osteoarthritis of AC (acromioclavicular) joints (Bilateral) 01/17/2020   Polysubstance abuse ([name]) 08/25/2019   Toxic metabolic encephalopathy 08/25/2019   Chronic pain syndrome 07/14/2019   Anticoagulated 07/14/2019   DDD (degenerative disc disease), lumbosacral 07/14/2019   DDD (degenerative disc  disease), cervical 07/14/2019   CHF (congestive heart failure) ([name]) 04/12/2017   Acute on chronic diastolic CHF (congestive heart failure) ([name]) 02/28/2015   OSA (obstructive sleep apnea) 02/28/2015   Acute on chronic diastolic congestive heart failure ([name])    Acute on chronic respiratory failure with hypoxia ([name])    Morbid obesity with BMI of 50.0-59.9, adult ([name]) 04/01/2014   HTN (hypertension) 04/01/2014   PCP:  Eloisa Northern, MD Pharmacy:   [facility] - [place], [place] - [address] [place] [address] Phone: [phone] Fax: [phone]     Social Determinants of Health (SDOH) Social History: SDOH Screenings   Food Insecurity: No Food Insecurity (09/07/2022)  Housing: Patient Declined (09/07/2022)  Transportation Needs: No Transportation Needs (09/12/2022)  Recent Concern: Transportation Needs - Unmet Transportation Needs (08/13/2022)  Utilities: Not At Risk (08/13/2022)  Depression (PHQ2-9): Low Risk  (06/07/2021)  Financial Resource Strain: Medium Risk (05/03/2017)  Physical Activity: Insufficiently Active (05/03/2017)  Social Connections: Moderately Integrated (05/03/2017)  Stress: No Stress Concern Present (05/03/2017)  Tobacco Use: Medium Risk (10/09/2022)   SDOH Interventions:     Readmission Risk Interventions    12/31/2021    8:58 AM 12/20/2021    4:21 PM 05/04/2021    1:26 PM  Readmission Risk Prevention Plan  Transportation Screening Complete Complete Complete  Medication Review Oceanographer) Complete Complete Complete  PCP or Specialist appointment within 3-5 days of discharge Complete Complete Complete  HRI or Home Care Consult Complete Complete Complete  SW Recovery Care/Counseling Consult Complete Complete Complete  Palliative Care Screening Complete Complete Not Applicable  Skilled Nursing Facility Not Applicable Not Applicable Not Applicable

## 2022-10-11 NOTE — Evaluation (Signed)
Physical Therapy Evaluation Patient Details Name: Gary [name] MRN: [id number] DOB: Nov 09, 1955 Today's Date: 10/11/2022  History of Present Illness  Gary [name] is a 67 y.o. 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 Pt was just discharged from the hospital 6/25 PM.  He was admitted due to increased cough and increased O2 requirement  Clinical Impression  Pt was pleasant and motivated to work with PT but clearly frustrated about his inability to manage at hime and complains about how much weaker he is now than just a few weeks ago.  He was able to move relatively well with bed mobility using rails, did need a high seat elevation (31") to get to standing (could not rise despite good effort multiple times at lower height.)  Pt able to do some in room ambulation but fatigued quickly with the effort.  No LOBs, in recliner with lift sling under him, nursing aware.  Pt will benefit from continued PT to address functional limitations and facilitate safe and appropriate d/c.     Recommendations for follow up therapy are one component of a multi-disciplinary discharge planning process, led by the attending physician.  Recommendations may be updated based on patient status, additional functional criteria and insurance authorization.  Follow Up Recommendations       Assistance Recommended at Discharge Frequent or constant Supervision/Assistance  Patient can return home with the following  A lot of help with walking and/or transfers;A lot of help with bathing/dressing/bathroom;Assistance with cooking/housework;Assistance with feeding;Direct supervision/assist for medications management;Direct supervision/assist for financial management;Assist for  transportation;Help with stairs or ramp for entrance    Equipment Recommendations Wheelchair (measurements PT);Wheelchair cushion (measurements PT);Hospital bed;BSC/3in1;Rolling walker (2 wheels)  Recommendations for Other Services       Functional Status Assessment Patient has had a recent decline in their functional status and demonstrates the ability to make significant improvements in function in a reasonable and predictable amount of time.     Precautions / Restrictions Precautions Precautions: Fall Precaution Comments: trach Restrictions Weight Bearing Restrictions: No      Mobility  Bed Mobility Overal bed mobility: Needs Assistance Bed Mobility: Supine to Sit     Supine to sit: Min guard     General bed mobility comments: definite need for rails, but able to attain sitting w/o assist    Transfers Overall transfer level: Needs assistance Equipment used: Rolling walker (2 wheels) Transfers: Bed to chair/wheelchair/BSC, Sit to/from Stand Sit to Stand: Min guard, From elevated surface   Step pivot transfers: Min guard, From elevated surface (31" seat height both standing bouts, unable/unwilling to rise from lower with repeated "Put the bed up a little bit more.")            Ambulation/Gait Ambulation/Gait assistance: Min guard Gait Distance (Feet): 15 Feet Assistive device: Rolling walker (2 wheels)         General Gait Details: Pt was able to ambulate to the in-room commode with slow but safe gait.  Highly reliant on the walk but did not need any assist once upright.  Pt was becoming quite fatigue by the time we took some retro steps back to the recliner  Stairs            Wheelchair Mobility    Modified Rankin (Stroke Patients Only)  Balance Overall balance assessment: Needs assistance Sitting-balance support: Feet supported Sitting balance-[name] Scale: Normal     Standing balance support: Bilateral upper extremity  supported Standing balance-[name] Scale: Fair Standing balance comment: with RW for support in standing                             Pertinent Vitals/Pain Pain Assessment Pain Assessment: No/denies pain    Home Living Family/patient expects to be discharged to:: Private residence   Available Help at Discharge: Family;Available PRN/intermittently Type of Home: Apartment Home Access: Level entry;Ramped entrance       Home Layout: One level Home Equipment: Cane - single point;Wheelchair - Nurse, children's (2 wheels);Hospital bed      Prior Function Prior Level of Function : Needs assist             Mobility Comments: Difficulty standing from standard height surfaces however once up can walk ~50 ft MOD I. ADLs Comments: assist for LB dressing, toileting, and IADLs     Hand Dominance   Dominant Hand: Right    Extremity/Trunk Assessment   Upper Extremity Assessment Upper Extremity Assessment: Overall WFL for tasks assessed    Lower Extremity Assessment Lower Extremity Assessment: Generalized weakness       Communication   Communication: Passy-Muir valve;No difficulties  Cognition Arousal/Alertness: Awake/alert Behavior During Therapy: WFL for tasks assessed/performed Overall Cognitive Status: Within Functional Limits for tasks assessed                                          General Comments General comments (skin integrity, edema, etc.): O2 doffed during toilet t/f, SPO2 mid 80s, resolved back to mid 90s quickly in sitting with 5L [place]    Exercises     Assessment/Plan    PT Assessment Patient needs continued PT services  PT Problem List Decreased strength;Cardiopulmonary status limiting activity;Decreased activity tolerance;Decreased balance;Decreased mobility       PT Treatment Interventions DME instruction;Therapeutic exercise;Gait training;Balance training;Stair training;Neuromuscular re-education;Functional  mobility training;Therapeutic activities;Patient/family education    PT Goals (Current goals can be found in the Care Plan section)  Acute Rehab PT Goals Patient Stated Goal: get walking well again PT Goal Formulation: With patient Time For Goal Achievement: 10/23/22 Potential to Achieve Goals: Fair    Frequency Min 3X/week     Co-evaluation PT/OT/SLP Co-Evaluation/Treatment: Yes Reason for Co-Treatment: Complexity of the patient's impairments (multi-system involvement);For patient/therapist safety;To address functional/ADL transfers PT goals addressed during session: Mobility/safety with mobility;Balance;Proper use of DME;Strengthening/ROM OT goals addressed during session: ADL's and self-care       AM-PAC PT "6 Clicks" Mobility  Outcome Measure Help needed turning from your back to your side while in a flat bed without using bedrails?: A Little Help needed moving from lying on your back to sitting on the side of a flat bed without using bedrails?: A Little Help needed moving to and from a bed to a chair (including a wheelchair)?: A Lot Help needed standing up from a chair using your arms (e.g., wheelchair or bedside chair)?: A Lot Help needed to walk in hospital room?: A Lot Help needed climbing 3-5 steps with a railing? : Total 6 Click Score: 13    End of Session Equipment Utilized During Treatment: Oxygen Activity Tolerance: Patient tolerated treatment well Patient left: in chair;with call bell/phone within reach Nurse  Communication: Need for lift equipment;Mobility status PT Visit Diagnosis: Muscle weakness (generalized) (M62.81);Difficulty in walking, not elsewhere classified (R26.2)    Time: 2956-2130 PT Time Calculation (min) (ACUTE ONLY): 31 min   Charges:   PT Evaluation $PT Eval Low Complexity: 1 Low PT Treatments $Therapeutic Activity: 8-22 mins        [name], DPT 10/11/2022, 3:50 PM

## 2022-10-11 NOTE — ED Notes (Signed)
Pt calling out, pt requesting for this tech to empty out urinal. Pt also requesting to get his feet covered. All requests completed. No other needs voiced at this time.

## 2022-10-12 DIAGNOSIS — J9611 Chronic respiratory failure with hypoxia: Secondary | ICD-10-CM | POA: Diagnosis not present

## 2022-10-12 NOTE — TOC Transition Note (Addendum)
Transition of Care [facility]) - CM/SW Discharge Note   Patient Details  Name: Gary [name] MRN: [id number] Date of Birth: 1956/03/08  [facility]) CM/SW Contact:  [name], LCSW Phone Number: 10/12/2022, 9:43 AM   Clinical Narrative:     Patient to dc home today via [facility], patient has all dme including NIV through [employer] at home from last admission and is set up with [name] and [facility] Nursing that can start once patient gf completes paperwork, confirms she has it.   No further dc needs.   Final next level of care: Home w Home Health Services Barriers to Discharge: No Barriers Identified   Patient Goals and CMS Choice CMS Medicare.gov Compare Post Acute Care list provided to:: Patient Choice offered to / list presented to : Patient  Discharge Placement                         Discharge Plan and Services Additional resources added to the After Visit Summary for                                       Social Determinants of Health (SDOH) Interventions SDOH Screenings   Food Insecurity: No Food Insecurity (09/07/2022)  Housing: Patient Declined (09/07/2022)  Transportation Needs: No Transportation Needs (09/12/2022)  Recent Concern: Transportation Needs - Unmet Transportation Needs (08/13/2022)  Utilities: Not At Risk (08/13/2022)  Depression (PHQ2-9): Low Risk  (06/07/2021)  Financial Resource Strain: Medium Risk (05/03/2017)  Physical Activity: Insufficiently Active (05/03/2017)  Social Connections: Moderately Integrated (05/03/2017)  Stress: No Stress Concern Present (05/03/2017)  Tobacco Use: Medium Risk (10/09/2022)     Readmission Risk Interventions    12/31/2021    8:58 AM 12/20/2021    4:21 PM 05/04/2021    1:26 PM  Readmission Risk Prevention Plan  Transportation Screening Complete Complete Complete  Medication Review Oceanographer) Complete Complete Complete  PCP or Specialist appointment within 3-5 days of discharge  Complete Complete Complete  HRI or Home Care Consult Complete Complete Complete  SW Recovery Care/Counseling Consult Complete Complete Complete  Palliative Care Screening Complete Complete Not Applicable  Skilled Nursing Facility Not Applicable Not Applicable Not Applicable

## 2022-10-12 NOTE — ED Notes (Signed)
Pt assisted to recliner per patient request.

## 2022-10-12 NOTE — ED Provider Notes (Signed)
-----------------------------------------   8:38 AM on 10/12/2022 ----------------------------------------- Patient currently requesting to be discharged home, states he feels comfortable receiving home health nursing services.  I also spoke with his girlfriend over the phone who is comfortable with this plan.  Social worker Morrie Sheldon confirms that patient has home health nursing available, only awaiting completion of paperwork by patient's girlfriend.  Will arrange for discharge home at this time.   Chesley Noon, MD 10/12/22 [phone]

## 2022-10-12 NOTE — ED Provider Notes (Signed)
-----------------------------------------   6:15 AM on 10/12/2022 -----------------------------------------   Blood pressure 122/72, pulse 100, temperature 98.4 F (36.9 C), temperature source Oral, resp. rate 20, SpO2 96 %.  The patient is calm and cooperative at this time.  There have been no acute events since the last update.  Awaiting disposition plan from Social Work team.   Irean Hong, MD 10/12/22 [phone]

## 2022-10-12 NOTE — ED Notes (Signed)
[facility]  called  to transport  pt  home 

## 2022-10-15 ENCOUNTER — Other Ambulatory Visit: Payer: Self-pay

## 2022-10-15 ENCOUNTER — Emergency Department
Admission: EM | Admit: 2022-10-15 | Discharge: 2022-10-15 | Disposition: A | Discharge status: Home / Self Care | Payer: Medicare Other | Attending: Emergency Medicine | Admitting: Emergency Medicine

## 2022-10-15 DIAGNOSIS — J9610 Chronic respiratory failure, unspecified whether with hypoxia or hypercapnia: Secondary | ICD-10-CM | POA: Diagnosis not present

## 2022-10-15 DIAGNOSIS — R0602 Shortness of breath: Secondary | ICD-10-CM | POA: Diagnosis present

## 2022-10-15 MED ORDER — PANTOPRAZOLE SODIUM 40 MG PO TBEC: 40.0000 mg | DELAYED_RELEASE_TABLET | Freq: Every day | ORAL | Status: DC

## 2022-10-15 MED ORDER — IPRATROPIUM-ALBUTEROL 0.5-2.5 (3) MG/3ML IN SOLN: 3.0000 mL | Freq: Four times a day (QID) | RESPIRATORY_TRACT | Status: DC | PRN

## 2022-10-15 MED ORDER — MIDODRINE HCL 5 MG PO TABS
10.0000 mg | ORAL_TABLET | Freq: Two times a day (BID) | ORAL | Status: DC
  Administered 2022-10-15 (×2): 10 mg via ORAL
  Filled 2022-10-15 (×2): qty 2

## 2022-10-15 MED ORDER — PREDNISONE 20 MG PO TABS
60.0000 mg | ORAL_TABLET | Freq: Every day | ORAL | Status: DC
  Administered 2022-10-15: 60 mg via ORAL
  Filled 2022-10-15: qty 3

## 2022-10-15 MED ORDER — POLYETHYLENE GLYCOL 3350 17 G PO PACK: 17.0000 g | PACK | Freq: Every day | ORAL | Status: DC | PRN

## 2022-10-15 MED ORDER — DOCUSATE SODIUM 100 MG PO CAPS
100.0000 mg | ORAL_CAPSULE | Freq: Every day | ORAL | Status: DC
  Administered 2022-10-15: 100 mg via ORAL
  Filled 2022-10-15: qty 1

## 2022-10-15 MED ORDER — APIXABAN 2.5 MG PO TABS
2.5000 mg | ORAL_TABLET | Freq: Two times a day (BID) | ORAL | Status: DC
  Administered 2022-10-15: 2.5 mg via ORAL
  Filled 2022-10-15 (×2): qty 1

## 2022-10-15 MED ORDER — FUROSEMIDE 40 MG PO TABS
80.0000 mg | ORAL_TABLET | Freq: Two times a day (BID) | ORAL | Status: DC
  Administered 2022-10-15 (×2): 80 mg via ORAL
  Filled 2022-10-15 (×2): qty 2

## 2022-10-15 MED ORDER — ACETAMINOPHEN 325 MG PO TABS: 650.0000 mg | ORAL_TABLET | Freq: Four times a day (QID) | ORAL | Status: DC | PRN

## 2022-10-15 MED ORDER — FERROUS SULFATE 325 (65 FE) MG PO TABS
325.0000 mg | ORAL_TABLET | Freq: Every day | ORAL | Status: DC
  Administered 2022-10-15: 325 mg via ORAL
  Filled 2022-10-15: qty 1

## 2022-10-15 NOTE — ED Triage Notes (Signed)
Patient BIB [facility] from home with shortness of breath due to power supply for oxygen going out at home due to weather. Denies other complaints at this time.
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
## 2022-10-15 NOTE — ED Notes (Signed)
Called [facility] for transport to residence
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
## 2022-10-15 NOTE — Progress Notes (Signed)
Patient from home. Placed on 28% trach collar. Trach supplies changed. Suctioned for mod white secretions. Tolerated well.

## 2022-10-16 ENCOUNTER — Inpatient Hospital Stay (HOSPITAL_COMMUNITY)
Admission: EM | Admit: 2022-10-16 | Discharge: 2022-11-12 | DRG: 291 | Disposition: A | Discharge status: Other Acute Inpatient Hospital | Payer: 59 | Attending: Internal Medicine | Admitting: Internal Medicine

## 2022-10-16 ENCOUNTER — Encounter (HOSPITAL_COMMUNITY): Payer: Self-pay | Admitting: Emergency Medicine

## 2022-10-16 ENCOUNTER — Emergency Department (HOSPITAL_COMMUNITY): Payer: 59

## 2022-10-16 ENCOUNTER — Emergency Department
Admission: EM | Admit: 2022-10-16 | Discharge: 2022-10-16 | Disposition: A | Discharge status: Home / Self Care | Payer: 59 | Attending: Emergency Medicine | Admitting: Emergency Medicine

## 2022-10-16 ENCOUNTER — Other Ambulatory Visit: Payer: Self-pay

## 2022-10-16 DIAGNOSIS — R627 Adult failure to thrive: Secondary | ICD-10-CM | POA: Diagnosis present

## 2022-10-16 DIAGNOSIS — Z8711 Personal history of peptic ulcer disease: Secondary | ICD-10-CM

## 2022-10-16 DIAGNOSIS — R0602 Shortness of breath: Principal | ICD-10-CM

## 2022-10-16 DIAGNOSIS — Z833 Family history of diabetes mellitus: Secondary | ICD-10-CM

## 2022-10-16 DIAGNOSIS — J9811 Atelectasis: Secondary | ICD-10-CM | POA: Diagnosis not present

## 2022-10-16 DIAGNOSIS — Z609 Problem related to social environment, unspecified: Secondary | ICD-10-CM | POA: Diagnosis present

## 2022-10-16 DIAGNOSIS — J9622 Acute and chronic respiratory failure with hypercapnia: Secondary | ICD-10-CM | POA: Diagnosis not present

## 2022-10-16 DIAGNOSIS — I10 Essential (primary) hypertension: Secondary | ICD-10-CM | POA: Diagnosis present

## 2022-10-16 DIAGNOSIS — Z515 Encounter for palliative care: Secondary | ICD-10-CM

## 2022-10-16 DIAGNOSIS — Z7952 Long term (current) use of systemic steroids: Secondary | ICD-10-CM

## 2022-10-16 DIAGNOSIS — Z6841 Body Mass Index (BMI) 40.0 and over, adult: Secondary | ICD-10-CM

## 2022-10-16 DIAGNOSIS — Z823 Family history of stroke: Secondary | ICD-10-CM

## 2022-10-16 DIAGNOSIS — D509 Iron deficiency anemia, unspecified: Secondary | ICD-10-CM | POA: Diagnosis present

## 2022-10-16 DIAGNOSIS — I130 Hypertensive heart and chronic kidney disease with heart failure and stage 1 through stage 4 chronic kidney disease, or unspecified chronic kidney disease: Principal | ICD-10-CM | POA: Diagnosis present

## 2022-10-16 DIAGNOSIS — G4733 Obstructive sleep apnea (adult) (pediatric): Secondary | ICD-10-CM | POA: Diagnosis present

## 2022-10-16 DIAGNOSIS — N1831 Chronic kidney disease, stage 3a: Secondary | ICD-10-CM | POA: Diagnosis present

## 2022-10-16 DIAGNOSIS — E869 Volume depletion, unspecified: Secondary | ICD-10-CM | POA: Diagnosis present

## 2022-10-16 DIAGNOSIS — Z7901 Long term (current) use of anticoagulants: Secondary | ICD-10-CM

## 2022-10-16 DIAGNOSIS — Z9911 Dependence on respirator [ventilator] status: Secondary | ICD-10-CM

## 2022-10-16 DIAGNOSIS — Z5986 Financial insecurity: Secondary | ICD-10-CM

## 2022-10-16 DIAGNOSIS — Z5320 Procedure and treatment not carried out because of patient's decision for unspecified reasons: Secondary | ICD-10-CM | POA: Diagnosis not present

## 2022-10-16 DIAGNOSIS — I428 Other cardiomyopathies: Secondary | ICD-10-CM | POA: Diagnosis present

## 2022-10-16 DIAGNOSIS — I714 Abdominal aortic aneurysm, without rupture, unspecified: Secondary | ICD-10-CM | POA: Diagnosis present

## 2022-10-16 DIAGNOSIS — N179 Acute kidney failure, unspecified: Secondary | ICD-10-CM | POA: Insufficient documentation

## 2022-10-16 DIAGNOSIS — G8929 Other chronic pain: Secondary | ICD-10-CM | POA: Diagnosis present

## 2022-10-16 DIAGNOSIS — E8809 Other disorders of plasma-protein metabolism, not elsewhere classified: Secondary | ICD-10-CM | POA: Diagnosis not present

## 2022-10-16 DIAGNOSIS — K529 Noninfective gastroenteritis and colitis, unspecified: Secondary | ICD-10-CM | POA: Diagnosis present

## 2022-10-16 DIAGNOSIS — Z930 Tracheostomy status: Secondary | ICD-10-CM

## 2022-10-16 DIAGNOSIS — E871 Hypo-osmolality and hyponatremia: Secondary | ICD-10-CM | POA: Diagnosis not present

## 2022-10-16 DIAGNOSIS — K219 Gastro-esophageal reflux disease without esophagitis: Secondary | ICD-10-CM | POA: Diagnosis present

## 2022-10-16 DIAGNOSIS — K5792 Diverticulitis of intestine, part unspecified, without perforation or abscess without bleeding: Secondary | ICD-10-CM | POA: Diagnosis present

## 2022-10-16 DIAGNOSIS — I2729 Other secondary pulmonary hypertension: Secondary | ICD-10-CM | POA: Diagnosis present

## 2022-10-16 DIAGNOSIS — E876 Hypokalemia: Secondary | ICD-10-CM | POA: Diagnosis not present

## 2022-10-16 DIAGNOSIS — Z86718 Personal history of other venous thrombosis and embolism: Secondary | ICD-10-CM

## 2022-10-16 DIAGNOSIS — E662 Morbid (severe) obesity with alveolar hypoventilation: Secondary | ICD-10-CM | POA: Diagnosis present

## 2022-10-16 DIAGNOSIS — I5033 Acute on chronic diastolic (congestive) heart failure: Secondary | ICD-10-CM | POA: Diagnosis present

## 2022-10-16 DIAGNOSIS — J9621 Acute and chronic respiratory failure with hypoxia: Secondary | ICD-10-CM | POA: Diagnosis not present

## 2022-10-16 DIAGNOSIS — J449 Chronic obstructive pulmonary disease, unspecified: Secondary | ICD-10-CM | POA: Diagnosis present

## 2022-10-16 DIAGNOSIS — Z5900 Homelessness unspecified: Secondary | ICD-10-CM | POA: Diagnosis not present

## 2022-10-16 DIAGNOSIS — F331 Major depressive disorder, recurrent, moderate: Secondary | ICD-10-CM | POA: Diagnosis present

## 2022-10-16 DIAGNOSIS — J9620 Acute and chronic respiratory failure, unspecified whether with hypoxia or hypercapnia: Secondary | ICD-10-CM

## 2022-10-16 DIAGNOSIS — M549 Dorsalgia, unspecified: Secondary | ICD-10-CM | POA: Diagnosis present

## 2022-10-16 DIAGNOSIS — Z8701 Personal history of pneumonia (recurrent): Secondary | ICD-10-CM

## 2022-10-16 DIAGNOSIS — I2781 Cor pulmonale (chronic): Secondary | ICD-10-CM | POA: Diagnosis present

## 2022-10-16 DIAGNOSIS — E872 Acidosis, unspecified: Secondary | ICD-10-CM | POA: Diagnosis not present

## 2022-10-16 DIAGNOSIS — I959 Hypotension, unspecified: Secondary | ICD-10-CM | POA: Diagnosis present

## 2022-10-16 DIAGNOSIS — Z5982 Transportation insecurity: Secondary | ICD-10-CM

## 2022-10-16 DIAGNOSIS — Z8616 Personal history of COVID-19: Secondary | ICD-10-CM

## 2022-10-16 DIAGNOSIS — Z87891 Personal history of nicotine dependence: Secondary | ICD-10-CM

## 2022-10-16 DIAGNOSIS — F419 Anxiety disorder, unspecified: Secondary | ICD-10-CM | POA: Diagnosis present

## 2022-10-16 DIAGNOSIS — E785 Hyperlipidemia, unspecified: Secondary | ICD-10-CM | POA: Diagnosis present

## 2022-10-16 DIAGNOSIS — F1910 Other psychoactive substance abuse, uncomplicated: Secondary | ICD-10-CM | POA: Diagnosis present

## 2022-10-16 DIAGNOSIS — Z79899 Other long term (current) drug therapy: Secondary | ICD-10-CM

## 2022-10-16 HISTORY — DX: Chronic kidney disease, stage 3a: N18.31

## 2022-10-16 HISTORY — DX: COVID-19: U07.1

## 2022-10-16 HISTORY — DX: Iron deficiency anemia, unspecified: D50.9

## 2022-10-16 HISTORY — DX: Abdominal aortic aneurysm, without rupture, unspecified: I71.40

## 2022-10-16 HISTORY — DX: Acute respiratory distress syndrome: J80

## 2022-10-16 MED ORDER — FUROSEMIDE 10 MG/ML IJ SOLN
80.0000 mg | Freq: Once | INTRAMUSCULAR | Status: AC
  Administered 2022-10-16: 80 mg via INTRAVENOUS
  Filled 2022-10-16: qty 8

## 2022-10-16 NOTE — ED Triage Notes (Signed)
Pt arrived via EMS. According to EMS pt girlfriend does not want to take care of pt anymore and is in need of being placed in a nursing home.

## 2022-10-16 NOTE — ED Provider Notes (Signed)
Care assumed of patient from outgoing provider.  See their note for initial history, exam and plan.  Clinical Course as of 10/16/22 1939  Tue Oct 16, 2022  1734 Patient screaming out in the hallway, yelling expletives at the nursing staff and myself.  Yelling that he needed a chair to sit in so he was more comfortable.  Explained multiple times that we had a very sick patient and that we would get him a chair when we were able to, he continued to scream and yell and curse at the staff.  Security was called to bedside.  Patient was yelling with no airway difficulties.  Had an oxygen tank and was holding his oxygen off of his tracheostomy.  Difficulty with verbally de-escalating, continued to curse and yell.  [SM]  1938 Patient continues to yell, shout, called me a multiple expletives throughout his stay.  Is now requesting to be discharged home because he feels like he cannot get help here.  I discussed with him that he needed to fill out his home health paperwork.  Discussed that he can continue to reach out for his home health services.  Information was provided on his AVS.  Patient has full capacity to be discharged back to his home. [SM]    Clinical Course User Index [SM] Corena Herter, MD     Corena Herter, MD 10/16/22 1735

## 2022-10-16 NOTE — ED Notes (Signed)
Patient continuously demonstrating aggressive behavior as well as vulgar language towards staff. Patient continues to be demanding and demeaning towards staff. Staff aware to ensure safe distances as well as provide clinical care in team format for safety.

## 2022-10-16 NOTE — ED Notes (Signed)
Patient yelled out requesting his black bag located behind his bed. There were two black bags and this RN handed the patient both. The patient threw one on the floor and yelled loudly "not this one".

## 2022-10-16 NOTE — ED Triage Notes (Signed)
Pt BIB EMS from home pt was being transported home from the hospital but then stated to EMS that he did not want to go home and be alone. Pt requesting placement due to not having home health, EMS reports that pts girlfriend is primary caregiver and she is currently out of town and refuses to return home. Pt states that sister Gary [name] is HCPOA. Upon arrival to ED pt started complaining of CP and ShOB states that it has been going on x 3days.

## 2022-10-16 NOTE — ED Provider Notes (Incomplete)
[place] EMERGENCY DEPARTMENT AT [facility] Provider Note   CSN: [id number] Arrival date & time: 10/16/22  2151     History {Add pertinent medical, surgical, social history, OB history to HPI:1} Chief Complaint  Patient presents with  . Failure To Thrive    Gary [name] is a 67 y.o. male.  Patient presents to the emergency department complaining of worsening shortness of breath and lack of home resources for home health.  Patient states that he has had worsening shortness of breath over the past week.  He states he had no one to help him get his Lasix over the past 3 days and has not been getting his twice daily dosage of 40 mg.  He uses 6 L/min of oxygen at baseline and is normally able to ambulate short distances using a walker.  He states that over the past year he has been all but 3 weeks in hospitals and rehabs.  He states that he was doing much better at rehab until he suffered a pneumonia and lost a great amount of strength and energy.  He is supposed to have a home health but states they are unable to start care for 3 weeks.  Patient also endorses increased fluid retention in lower extremities and ankles and believes he may have fluid on the lungs.  Patient has a tracheostomy in place.  Past medical history significant for obesity, hypoxia, respiratory failure, COPD, GI bleeds, heart failure with preserved ejection fraction  HPI     Home Medications Prior to Admission medications   Medication Sig Start Date End Date Taking? Authorizing Provider  acetaminophen (TYLENOL) 325 MG tablet Take 2 tablets (650 mg total) by mouth every 6 (six) hours as needed for mild pain (or Fever >/= 101). 07/25/22   Loyce Dys, MD  apixaban (ELIQUIS) 2.5 MG TABS tablet Take 1 tablet (2.5 mg total) by mouth 2 (two) times daily. 09/14/22   Hughie Closs, MD  docusate sodium (COLACE) 100 MG capsule Take 1 capsule (100 mg total) by mouth daily. 08/22/22   Rai, Delene Ruffini, MD  ferrous sulfate  325 (65 FE) MG tablet Take 1 tablet (325 mg total) by mouth daily with breakfast. Home med 04/06/22   Darlin Priestly, MD  furosemide (LASIX) 80 MG tablet Take 1 tablet (80 mg total) by mouth 2 (two) times daily. 09/12/22 09/12/23  Hughie Closs, MD  ipratropium-albuterol (DUONEB) 0.5-2.5 (3) MG/3ML SOLN Take 3 mLs by nebulization every 6 (six) hours as needed. 04/06/22   Darlin Priestly, MD  mesalamine (LIALDA) 1.2 g EC tablet Take 2 tablets (2.4 g total) by mouth 2 (two) times daily. 09/12/22 10/12/22  Hughie Closs, MD  midodrine (PROAMATINE) 10 MG tablet Take 1 tablet (10 mg total) by mouth 2 (two) times daily with a meal. 10/08/22   Loyce Dys, MD  pantoprazole (PROTONIX) 40 MG tablet Take 1 tablet (40 mg total) by mouth at bedtime. 10/08/22   Loyce Dys, MD  polyethylene glycol (MIRALAX / GLYCOLAX) 17 g packet Take 17 g by mouth daily as needed for moderate constipation. 12/13/21   Amin, Loura Halt, MD  predniSONE (DELTASONE) 10 MG tablet Take 3 tablets (30 mg total) by mouth daily with breakfast. Patient not taking: Reported on 10/16/2022 10/09/22   Loyce Dys, MD  predniSONE (DELTASONE) 10 MG tablet Take 1 tablet (10 mg total) by mouth daily with breakfast. 10/15/22   Loyce Dys, MD  predniSONE (DELTASONE) 20 MG tablet Take  1 tablet (20 mg total) by mouth daily with breakfast. 10/12/22   Loyce Dys, MD      Allergies    Patient has no known allergies.    Review of Systems   Review of Systems  Physical Exam Updated Vital Signs BP (!) 129/91   Pulse 95   Temp 98.1 F (36.7 C) (Oral)   Resp 20   Ht 6\' 4"  (1.93 m)   Wt (!) 204.1 kg   SpO2 92%   BMI 54.78 kg/m  Physical Exam Vitals and nursing note reviewed.  Constitutional:      General: He is not in acute distress.    Appearance: He is obese.  HENT:     Head: Normocephalic.     Mouth/Throat:     Comments: Tracheostomy in place Eyes:     Conjunctiva/sclera: Conjunctivae normal.  Cardiovascular:     Rate and Rhythm: Normal  rate and regular rhythm.  Pulmonary:     Effort: Pulmonary effort is normal.     Breath sounds: Normal breath sounds. No wheezing, rhonchi or rales.  Abdominal:     Palpations: Abdomen is soft.     Tenderness: There is no abdominal tenderness.  Musculoskeletal:     Right lower leg: Edema present.     Left lower leg: Edema present.  Skin:    General: Skin is warm and dry.     Capillary Refill: Capillary refill takes less than 2 seconds.  Neurological:     Mental Status: He is alert and oriented to person, place, and time.  Psychiatric:        Behavior: Behavior normal.     ED Results / Procedures / Treatments   Labs (all labs ordered are listed, but only abnormal results are displayed) Labs Reviewed  CBC WITH DIFFERENTIAL/PLATELET  BASIC METABOLIC PANEL  BRAIN NATRIURETIC PEPTIDE  TROPONIN I (HIGH SENSITIVITY)    EKG None  Radiology No results found.  Procedures Procedures  {Document cardiac monitor, telemetry assessment procedure when appropriate:1}  Medications Ordered in ED Medications  furosemide (LASIX) injection 80 mg (has no administration in time range)    ED Course/ Medical Decision Making/ A&P   {   Click here for ABCD2, HEART and other calculatorsREFRESH Note before signing :1}                          Medical Decision Making Amount and/or Complexity of Data Reviewed Labs: ordered. Radiology: ordered.  Risk Prescription drug management.   This patient presents to the ED for concern of shortness of breath, this involves an extensive number of treatment options, and is a complaint that carries with it a high risk of complications and morbidity.  The differential diagnosis includes pneumonia, CHF exacerbation, pneumonia, others   Co morbidities that complicate the patient evaluation  History tracheostomy with 6 L/min baseline oxygen requirement   Additional history obtained:  Additional history obtained from EMS External records from  outside source obtained and reviewed including outside emergency department notes from the past 2 days   Lab Tests:  I Ordered, and personally interpreted labs.  The pertinent results include: Cardiomegaly   Imaging Studies ordered:  I ordered imaging studies including chest x-ray I independently visualized and interpreted imaging which showed *** I agree with the radiologist interpretation   Cardiac Monitoring: / EKG:  The patient was maintained on a cardiac monitor.  I personally viewed and interpreted the cardiac monitored which showed an  underlying rhythm of: Sinus rhythm   Consultations Obtained:  I requested consultation with the ***,  and discussed lab and imaging findings as well as pertinent plan - they recommend: ***   Problem List / ED Course / Critical interventions / Medication management  *** I ordered medication including ***  for ***  Reevaluation of the patient after these medicines showed that the patient {resolved/improved/worsened:23923::"improved"} I have reviewed the patients home medicines and have made adjustments as needed   Social Determinants of Health:  ***   Test / Admission - Considered:  ***   {Document critical care time when appropriate:1} {Document review of labs and clinical decision tools ie heart score, Chads2Vasc2 etc:1}  {Document your independent review of radiology images, and any outside records:1} {Document your discussion with family members, caretakers, and with consultants:1} {Document social determinants of health affecting pt's care:1} {Document your decision making why or why not admission, treatments were needed:1} Final Clinical Impression(s) / ED Diagnoses Final diagnoses:  None    Rx / DC Orders ED Discharge Orders     None

## 2022-10-16 NOTE — ED Notes (Signed)
Patient in hallway 12 now requesting a room, Dr. Modesto Charon made patient aware that he had a room but refused it and that all rooms are now occupied.

## 2022-10-16 NOTE — ED Notes (Signed)
Respiratory at bedside.

## 2022-10-16 NOTE — ED Notes (Signed)
Patient screaming out "I need a recliner." This RN responded with "When I find one, I will bring it to you." Patient responded with "I need a recliner now." Dr. Arnoldo Morale now at bedside trying to de-escalate patient and requesting him to be calm and more quiet. Patient continuing to yell and now demanding a bed pan and telling this RN to "shut up" when I tried to ask him not to scoot to the end of the bed as his was pulling off his oxygen. Patient states "I don't care". Patient is now sitting on the edge of the bed.

## 2022-10-16 NOTE — ED Provider Notes (Signed)
[facility] Provider Note    Event Date/Time   First MD Initiated Contact with Patient 10/16/22 1404     (approximate)   History   Homeless   HPI  Gary [name] is a 67 y.o. male   Past medical history of extensive chronic medical comorbidities including chronic respiratory failure with tracheostomy on supplemental oxygen who presents to the emergency department requesting social work evaluation for placement to skilled nursing facility.  He was recently discharged and was supposed to have home health services but he states that nobody has come out to his house.  He reports no other acute medical complaints.      Physical Exam   Triage Vital Signs: ED Triage Vitals  Enc Vitals Group     BP 10/16/22 1232 130/68     Pulse Rate 10/16/22 1232 88     Resp 10/16/22 1232 (!) 22     Temp 10/16/22 1232 98.4 F (36.9 C)     Temp Source 10/16/22 1232 Oral     SpO2 10/16/22 1232 90 %     Weight 10/16/22 1230 (!) 450 lb (204.1 kg)     Height 10/16/22 1230 6\' 3"  (1.905 m)     Head Circumference --      Peak Flow --      Pain Score 10/16/22 1230 0     Pain Loc --      Pain Edu? --      Excl. in GC? --     Most recent vital signs: Vitals:   10/16/22 1232  BP: 130/68  Pulse: 88  Resp: (!) 22  Temp: 98.4 F (36.9 C)  SpO2: 90%    General: Awake, no distress.  CV:  Good peripheral perfusion.  Resp:  Normal effort. Abd:  No distention.  Other:  Trach in place no respiratory distress, chronically ill-appearing, shouting at staff, comfortable and nontoxic appearance.   ED Results / Procedures / Treatments   Labs (all labs ordered are listed, but only abnormal results are displayed) Labs Reviewed - No data to display    PROCEDURES:  Critical Care performed: No  Procedures   MEDICATIONS ORDERED IN ED: Medications - No data to display  IMPRESSION / MDM / ASSESSMENT AND PLAN / ED COURSE  I reviewed the triage vital signs and the  nursing notes.                                Patient's presentation is most consistent with severe exacerbation of chronic illness.  Differential diagnosis includes, but is not limited to, social problem, placement problem, considered acute medical complications like respiratory failure, infection, but patient without any medical complaints I think this is less likely.    MDM: Patient here with chief complaint that his home health agency has not come out to his house and he has high needs, requesting social work reevaluation for skilled nursing facility placement.  He reports no other acute medical complaints so we will defer medical workup at this time, continue on his home oxygenation and TOC consultation.       FINAL CLINICAL IMPRESSION(S) / ED DIAGNOSES   Final diagnoses:  Social problem     Rx / DC Orders   ED Discharge Orders     None        Note:  This document was prepared using Dragon voice recognition software and may include unintentional dictation  errors.    Pilar Jarvis, MD 10/16/22 [phone]

## 2022-10-16 NOTE — TOC Progression Note (Addendum)
Transition of Care [facility]) - Progression Note    Patient Details  Name: Gary [name] MRN: [id number] Date of Birth: 06-Jan-1956  Transition [facility]) CM/SW Contact  [name], [place] Phone Number: 10/16/2022, 4:03 PM  Clinical Narrative:     CSW was informed that patient has arrived in the ED today.  Patient states his girlfriend said she can not take care of him anymore.  Patient was accepted by [name] for [facility] PT.  Per [name] and [name] at [facility], the [place] adult nursing branch of the company will need to have paperwork completed before they can see him.  Once patient discharges, [name] will usually see patient's within a day or two for [facility] PT.    Per previous TOC note, patient's girlfriend completed paperwork for patient to get [facility] adult nursing, but never submitted it.    [name] from [name] attempted to contact [name] the nursing liaison [phone] for [facility], and left a message awaiting for a call back.    If patient discharges [name] can follow up with him at home to complete paperwork.  Will need HH PT orders and face to face to resume care for patient.  CSW added [facility] phone number to AVS so patient can follow up from home.  CSW also requested [facility] PT orders and face to face from physician.       Expected Discharge Plan and Services    Back home to his apartment.                                           Social Determinants of Health (SDOH) Interventions SDOH Screenings   Food Insecurity: No Food Insecurity (09/07/2022)  Housing: Patient Declined (09/07/2022)  Transportation Needs: No Transportation Needs (09/12/2022)  Recent Concern: Transportation Needs - Unmet Transportation Needs (08/13/2022)  Utilities: Not At Risk (08/13/2022)  Depression (PHQ2-9): Low Risk  (06/07/2021)  Financial Resource Strain: Medium Risk (05/03/2017)  Physical Activity: Insufficiently Active (05/03/2017)  Social Connections: Moderately  Integrated (05/03/2017)  Stress: No Stress Concern Present (05/03/2017)  Tobacco Use: Medium Risk (10/15/2022)    Readmission Risk Interventions    12/31/2021    8:58 AM 12/20/2021    4:21 PM 05/04/2021    1:26 PM  Readmission Risk Prevention Plan  Transportation Screening Complete Complete Complete  Medication Review Oceanographer) Complete Complete Complete  PCP or Specialist appointment within 3-5 days of discharge Complete Complete Complete  HRI or Home Care Consult Complete Complete Complete  SW Recovery Care/Counseling Consult Complete Complete Complete  Palliative Care Screening Complete Complete Not Applicable  Skilled Nursing Facility Not Applicable Not Applicable Not Applicable

## 2022-10-16 NOTE — ED Notes (Signed)
Patient screaming in the hallway reporting "I don't have no air." Patient has self removed oxygen. Dr. Arnoldo Morale at bedside along with this RN. Patient cursing at staff and yelling "you're not helping me. You hate me". Security paged to bedside. Patient stating "I'll get out of here if ya'll get me situated." When asked by Oletha Cruel RN "How can we get you situated?" Patient replied with "I need to be suctioned and I need to get back in bed." Patient was provided a walker for him to ambulate back into bed. Patient refused. Nursing staff and Dr. Arnoldo Morale left patient to speak with 3 security officers. Patient is now cursing at security stating "you don't even know what the fuck you're talking about." Will call respiratory to suction patient.

## 2022-10-16 NOTE — ED Notes (Signed)
Pt refusing to go to room 11 states "there are ghosts in there". Pt explained that the room was held for him due to it having a lift to help with safe movement. Pt continues to refuse to go into room, placed in 12H.

## 2022-10-16 NOTE — Progress Notes (Addendum)
Notified by RN, per patient request needing to to be suctioned.  Patient has taken himself off oxygen and monitor. Suctioned patient for small to moderate amount clear/white secretions.  Patient let me know when he felt he no longer needed me to continue to suction. Patient non labored breathing and not noted to be in any distress during or after suctioning.

## 2022-10-16 NOTE — Progress Notes (Signed)
   10/16/22 2000  Tracheostomy Shiley XLT Proximal 7 mm Uncuffed  Placement Date/Time: 10/05/22 1457   Serial / Lot #: [id number]  Expiration Date: 11/04/22  Placed By: Self  Brand: Shiley XLT Proximal  Size (mm): 7 mm  Style: Uncuffed  Status Secured with trach ties;Passy Muir Speaking valve  Site Assessment Crusty;Dry  Site Care Cleansed;Dressing applied  Inner Cannula Care Changed/new  Ties Assessment Clean, Dry  Cuff Pressure (cm H2O)  (cuffless)   Patient suctioned with small amount of thick tan/yellow secretion returned. Inner cannula and dressing changed. Additional trach supplies provided. Patient on venturi/trach collar set up  at 2L 24% on tank.

## 2022-10-16 NOTE — ED Notes (Signed)
Patient used walker to get back into bed. HOB adjusted by [employer], RN. Security no longer at bedside.

## 2022-10-16 NOTE — ED Notes (Signed)
Pt refused blood work at this time.

## 2022-10-16 NOTE — ED Provider Notes (Signed)
[place] EMERGENCY DEPARTMENT AT [facility] Provider Note   CSN: [id number] Arrival date & time: 10/16/22  2151     History {Add pertinent medical, surgical, social history, OB history to HPI:1} Chief Complaint  Patient presents with   Failure To Thrive    Gary [name] is a 67 y.o. male.  Patient presents to the emergency department complaining of worsening shortness of breath and lack of home resources for home health.  Patient states that he has had worsening shortness of breath over the past week.  He states he had no one to help him get his Lasix over the past 3 days and has not been getting his twice daily dosage of 40 mg.  He uses 6 L/min of oxygen at baseline and is normally able to ambulate short distances using a walker.  He states that over the past year he has been all but 3 weeks in hospitals and rehabs.  He states that he was doing much better at rehab until he suffered a pneumonia and lost a great amount of strength and energy.  He is supposed to have a home health but states they are unable to start care for 3 weeks.  Patient also endorses increased fluid retention in lower extremities and ankles and believes he may have fluid on the lungs.  Patient has a tracheostomy in place.  Past medical history significant for obesity, hypoxia, respiratory failure, COPD, GI bleeds, heart failure with preserved ejection fraction  HPI     Home Medications Prior to Admission medications   Medication Sig Start Date End Date Taking? Authorizing Provider  acetaminophen (TYLENOL) 325 MG tablet Take 2 tablets (650 mg total) by mouth every 6 (six) hours as needed for mild pain (or Fever >/= 101). 07/25/22   Loyce Dys, MD  apixaban (ELIQUIS) 2.5 MG TABS tablet Take 1 tablet (2.5 mg total) by mouth 2 (two) times daily. 09/14/22   Hughie Closs, MD  docusate sodium (COLACE) 100 MG capsule Take 1 capsule (100 mg total) by mouth daily. 08/22/22   Rai, Delene Ruffini, MD  ferrous sulfate  325 (65 FE) MG tablet Take 1 tablet (325 mg total) by mouth daily with breakfast. Home med 04/06/22   Darlin Priestly, MD  furosemide (LASIX) 80 MG tablet Take 1 tablet (80 mg total) by mouth 2 (two) times daily. 09/12/22 09/12/23  Hughie Closs, MD  ipratropium-albuterol (DUONEB) 0.5-2.5 (3) MG/3ML SOLN Take 3 mLs by nebulization every 6 (six) hours as needed. 04/06/22   Darlin Priestly, MD  mesalamine (LIALDA) 1.2 g EC tablet Take 2 tablets (2.4 g total) by mouth 2 (two) times daily. 09/12/22 10/12/22  Hughie Closs, MD  midodrine (PROAMATINE) 10 MG tablet Take 1 tablet (10 mg total) by mouth 2 (two) times daily with a meal. 10/08/22   Loyce Dys, MD  pantoprazole (PROTONIX) 40 MG tablet Take 1 tablet (40 mg total) by mouth at bedtime. 10/08/22   Loyce Dys, MD  polyethylene glycol (MIRALAX / GLYCOLAX) 17 g packet Take 17 g by mouth daily as needed for moderate constipation. 12/13/21   Amin, Loura Halt, MD  predniSONE (DELTASONE) 10 MG tablet Take 3 tablets (30 mg total) by mouth daily with breakfast. Patient not taking: Reported on 10/16/2022 10/09/22   Loyce Dys, MD  predniSONE (DELTASONE) 10 MG tablet Take 1 tablet (10 mg total) by mouth daily with breakfast. 10/15/22   Loyce Dys, MD  predniSONE (DELTASONE) 20 MG tablet Take  1 tablet (20 mg total) by mouth daily with breakfast. 10/12/22   Loyce Dys, MD      Allergies    Patient has no known allergies.    Review of Systems   Review of Systems  Physical Exam Updated Vital Signs BP (!) 129/91   Pulse 95   Temp 98.1 F (36.7 C) (Oral)   Resp 20   Ht 6\' 4"  (1.93 m)   Wt (!) 204.1 kg   SpO2 92%   BMI 54.78 kg/m  Physical Exam  ED Results / Procedures / Treatments   Labs (all labs ordered are listed, but only abnormal results are displayed) Labs Reviewed  CBC WITH DIFFERENTIAL/PLATELET  BASIC METABOLIC PANEL  BRAIN NATRIURETIC PEPTIDE  TROPONIN I (HIGH SENSITIVITY)    EKG None  Radiology No results  found.  Procedures Procedures  {Document cardiac monitor, telemetry assessment procedure when appropriate:1}  Medications Ordered in ED Medications  furosemide (LASIX) injection 80 mg (has no administration in time range)    ED Course/ Medical Decision Making/ A&P   {   Click here for ABCD2, HEART and other calculatorsREFRESH Note before signing :1}                          Medical Decision Making Amount and/or Complexity of Data Reviewed Labs: ordered. Radiology: ordered.  Risk Prescription drug management.   ***  {Document critical care time when appropriate:1} {Document review of labs and clinical decision tools ie heart score, Chads2Vasc2 etc:1}  {Document your independent review of radiology images, and any outside records:1} {Document your discussion with family members, caretakers, and with consultants:1} {Document social determinants of health affecting pt's care:1} {Document your decision making why or why not admission, treatments were needed:1} Final Clinical Impression(s) / ED Diagnoses Final diagnoses:  None    Rx / DC Orders ED Discharge Orders     None

## 2022-10-16 NOTE — Progress Notes (Signed)
Rt called to suction patient. Pt refused room placement and wanted to be placed in hall, due to this, pt set up on bedside O2 monitor and suctioned with portable suction. Received moderate amount tan thick secretions. Pt exhibited a strong cough. Pts saturations remained in the low to mid 90's during and after suctioning. Pt left on 24%/ 2L venturi trach set up.

## 2022-10-17 ENCOUNTER — Encounter (HOSPITAL_COMMUNITY): Payer: Self-pay | Admitting: Internal Medicine

## 2022-10-17 DIAGNOSIS — J449 Chronic obstructive pulmonary disease, unspecified: Secondary | ICD-10-CM | POA: Diagnosis present

## 2022-10-17 DIAGNOSIS — F419 Anxiety disorder, unspecified: Secondary | ICD-10-CM

## 2022-10-17 DIAGNOSIS — G4733 Obstructive sleep apnea (adult) (pediatric): Secondary | ICD-10-CM | POA: Diagnosis not present

## 2022-10-17 DIAGNOSIS — Z6841 Body Mass Index (BMI) 40.0 and over, adult: Secondary | ICD-10-CM

## 2022-10-17 DIAGNOSIS — I10 Essential (primary) hypertension: Secondary | ICD-10-CM | POA: Diagnosis not present

## 2022-10-17 DIAGNOSIS — E869 Volume depletion, unspecified: Secondary | ICD-10-CM | POA: Diagnosis present

## 2022-10-17 DIAGNOSIS — I5032 Chronic diastolic (congestive) heart failure: Secondary | ICD-10-CM | POA: Diagnosis not present

## 2022-10-17 DIAGNOSIS — E662 Morbid (severe) obesity with alveolar hypoventilation: Secondary | ICD-10-CM | POA: Diagnosis present

## 2022-10-17 DIAGNOSIS — R0609 Other forms of dyspnea: Secondary | ICD-10-CM | POA: Diagnosis not present

## 2022-10-17 DIAGNOSIS — K219 Gastro-esophageal reflux disease without esophagitis: Secondary | ICD-10-CM | POA: Diagnosis not present

## 2022-10-17 DIAGNOSIS — I5033 Acute on chronic diastolic (congestive) heart failure: Secondary | ICD-10-CM | POA: Diagnosis present

## 2022-10-17 DIAGNOSIS — D509 Iron deficiency anemia, unspecified: Secondary | ICD-10-CM

## 2022-10-17 DIAGNOSIS — I714 Abdominal aortic aneurysm, without rupture, unspecified: Secondary | ICD-10-CM | POA: Diagnosis present

## 2022-10-17 DIAGNOSIS — E871 Hypo-osmolality and hyponatremia: Secondary | ICD-10-CM | POA: Diagnosis not present

## 2022-10-17 DIAGNOSIS — Z9911 Dependence on respirator [ventilator] status: Secondary | ICD-10-CM | POA: Diagnosis not present

## 2022-10-17 DIAGNOSIS — Z8616 Personal history of COVID-19: Secondary | ICD-10-CM | POA: Diagnosis not present

## 2022-10-17 DIAGNOSIS — E872 Acidosis, unspecified: Secondary | ICD-10-CM | POA: Diagnosis not present

## 2022-10-17 DIAGNOSIS — N179 Acute kidney failure, unspecified: Secondary | ICD-10-CM | POA: Diagnosis present

## 2022-10-17 DIAGNOSIS — F331 Major depressive disorder, recurrent, moderate: Secondary | ICD-10-CM | POA: Diagnosis present

## 2022-10-17 DIAGNOSIS — J441 Chronic obstructive pulmonary disease with (acute) exacerbation: Secondary | ICD-10-CM | POA: Diagnosis not present

## 2022-10-17 DIAGNOSIS — I130 Hypertensive heart and chronic kidney disease with heart failure and stage 1 through stage 4 chronic kidney disease, or unspecified chronic kidney disease: Secondary | ICD-10-CM | POA: Diagnosis present

## 2022-10-17 DIAGNOSIS — I2781 Cor pulmonale (chronic): Secondary | ICD-10-CM | POA: Diagnosis present

## 2022-10-17 DIAGNOSIS — Z86718 Personal history of other venous thrombosis and embolism: Secondary | ICD-10-CM | POA: Diagnosis not present

## 2022-10-17 DIAGNOSIS — Z930 Tracheostomy status: Secondary | ICD-10-CM | POA: Diagnosis not present

## 2022-10-17 DIAGNOSIS — E785 Hyperlipidemia, unspecified: Secondary | ICD-10-CM

## 2022-10-17 DIAGNOSIS — I2729 Other secondary pulmonary hypertension: Secondary | ICD-10-CM | POA: Diagnosis present

## 2022-10-17 DIAGNOSIS — J9620 Acute and chronic respiratory failure, unspecified whether with hypoxia or hypercapnia: Secondary | ICD-10-CM | POA: Diagnosis not present

## 2022-10-17 DIAGNOSIS — J9622 Acute and chronic respiratory failure with hypercapnia: Secondary | ICD-10-CM | POA: Diagnosis not present

## 2022-10-17 DIAGNOSIS — F1910 Other psychoactive substance abuse, uncomplicated: Secondary | ICD-10-CM | POA: Diagnosis present

## 2022-10-17 DIAGNOSIS — N1831 Chronic kidney disease, stage 3a: Secondary | ICD-10-CM | POA: Diagnosis present

## 2022-10-17 DIAGNOSIS — F32A Depression, unspecified: Secondary | ICD-10-CM

## 2022-10-17 DIAGNOSIS — I959 Hypotension, unspecified: Secondary | ICD-10-CM | POA: Diagnosis present

## 2022-10-17 DIAGNOSIS — J9621 Acute and chronic respiratory failure with hypoxia: Secondary | ICD-10-CM | POA: Insufficient documentation

## 2022-10-17 DIAGNOSIS — K5792 Diverticulitis of intestine, part unspecified, without perforation or abscess without bleeding: Secondary | ICD-10-CM | POA: Diagnosis present

## 2022-10-17 DIAGNOSIS — Z7189 Other specified counseling: Secondary | ICD-10-CM | POA: Diagnosis not present

## 2022-10-17 DIAGNOSIS — Z515 Encounter for palliative care: Secondary | ICD-10-CM | POA: Diagnosis not present

## 2022-10-17 LAB — CBC WITH DIFFERENTIAL/PLATELET
Abs Immature Granulocytes: 0.02 10*3/uL (ref 0.00–0.07)
Basophils Absolute: 0 10*3/uL (ref 0.0–0.1)
Basophils Relative: 0 %
Eosinophils Absolute: 0.1 10*3/uL (ref 0.0–0.5)
Eosinophils Relative: 1 %
HCT: 35.5 % — ABNORMAL LOW (ref 39.0–52.0)
Hemoglobin: 9.3 g/dL — ABNORMAL LOW (ref 13.0–17.0)
Immature Granulocytes: 0 %
Lymphocytes Relative: 15 %
Lymphs Abs: 1.2 10*3/uL (ref 0.7–4.0)
MCH: 21.1 pg — ABNORMAL LOW (ref 26.0–34.0)
MCHC: 26.2 g/dL — ABNORMAL LOW (ref 30.0–36.0)
MCV: 80.5 fL (ref 80.0–100.0)
Monocytes Absolute: 0.9 10*3/uL (ref 0.1–1.0)
Monocytes Relative: 11 %
Neutro Abs: 5.8 10*3/uL (ref 1.7–7.7)
Neutrophils Relative %: 73 %
Platelets: 145 10*3/uL — ABNORMAL LOW (ref 150–400)
RBC: 4.41 MIL/uL (ref 4.22–5.81)
RDW: 19 % — ABNORMAL HIGH (ref 11.5–15.5)
WBC: 8.1 10*3/uL (ref 4.0–10.5)
nRBC: 0.6 % — ABNORMAL HIGH (ref 0.0–0.2)

## 2022-10-17 LAB — CBC
HCT: 37.3 % — ABNORMAL LOW (ref 39.0–52.0)
Hemoglobin: 9.5 g/dL — ABNORMAL LOW (ref 13.0–17.0)
MCH: 21.1 pg — ABNORMAL LOW (ref 26.0–34.0)
MCHC: 25.5 g/dL — ABNORMAL LOW (ref 30.0–36.0)
MCV: 82.7 fL (ref 80.0–100.0)
Platelets: 145 10*3/uL — ABNORMAL LOW (ref 150–400)
RBC: 4.51 MIL/uL (ref 4.22–5.81)
RDW: 19.1 % — ABNORMAL HIGH (ref 11.5–15.5)
WBC: 7.6 10*3/uL (ref 4.0–10.5)
nRBC: 1.1 % — ABNORMAL HIGH (ref 0.0–0.2)

## 2022-10-17 LAB — BRAIN NATRIURETIC PEPTIDE: B Natriuretic Peptide: 26.8 pg/mL (ref 0.0–100.0)

## 2022-10-17 LAB — BASIC METABOLIC PANEL
Anion gap: 12 (ref 5–15)
Anion gap: 8 (ref 5–15)
BUN: 31 mg/dL — ABNORMAL HIGH (ref 8–23)
BUN: 25 mg/dL — ABNORMAL HIGH (ref 8–23)
CO2: 31 mmol/L (ref 22–32)
CO2: 33 mmol/L — ABNORMAL HIGH (ref 22–32)
Calcium: 7.7 mg/dL — ABNORMAL LOW (ref 8.9–10.3)
Calcium: 7.5 mg/dL — ABNORMAL LOW (ref 8.9–10.3)
Chloride: 98 mmol/L (ref 98–111)
Chloride: 99 mmol/L (ref 98–111)
Creatinine, Ser: 1.11 mg/dL (ref 0.61–1.24)
Creatinine, Ser: 1.18 mg/dL (ref 0.61–1.24)
GFR, Estimated: >60 mL/min (ref >60)
GFR, Estimated: >60 mL/min (ref >60)
Glucose, Bld: 76 mg/dL (ref 70–99)
Glucose, Bld: 123 mg/dL — ABNORMAL HIGH (ref 70–99)
Potassium: 4.8 mmol/L (ref 3.5–5.1)
Potassium: 4.1 mmol/L (ref 3.5–5.1)
Sodium: 141 mmol/L (ref 135–145)
Sodium: 140 mmol/L (ref 135–145)

## 2022-10-17 LAB — TROPONIN I (HIGH SENSITIVITY)
Troponin I (High Sensitivity): 11 ng/L (ref <18)
Troponin I (High Sensitivity): 10 ng/L (ref <18)

## 2022-10-17 MED ORDER — FUROSEMIDE 20 MG PO TABS
80.0000 mg | ORAL_TABLET | Freq: Two times a day (BID) | ORAL | Status: DC
  Administered 2022-10-17: 80 mg via ORAL
  Filled 2022-10-17: qty 4

## 2022-10-17 MED ORDER — ACETAMINOPHEN 325 MG PO TABS
650.0000 mg | ORAL_TABLET | Freq: Four times a day (QID) | ORAL | Status: DC | PRN
  Administered 2022-10-17 – 2022-11-05 (×28): 650 mg via ORAL
  Filled 2022-10-17 (×28): qty 2

## 2022-10-17 MED ORDER — MIDODRINE HCL 5 MG PO TABS
10.0000 mg | ORAL_TABLET | Freq: Two times a day (BID) | ORAL | Status: DC
  Administered 2022-10-17 – 2022-11-12 (×54): 10 mg via ORAL
  Filled 2022-10-17 (×54): qty 2

## 2022-10-17 MED ORDER — PREDNISONE 10 MG PO TABS
10.0000 mg | ORAL_TABLET | Freq: Every day | ORAL | Status: DC
  Administered 2022-10-17 – 2022-11-12 (×27): 10 mg via ORAL
  Filled 2022-10-17 (×27): qty 1

## 2022-10-17 MED ORDER — APIXABAN 2.5 MG PO TABS
2.5000 mg | ORAL_TABLET | Freq: Two times a day (BID) | ORAL | Status: DC
  Administered 2022-10-17 – 2022-11-12 (×52): 2.5 mg via ORAL
  Filled 2022-10-17 (×52): qty 1

## 2022-10-17 MED ORDER — FUROSEMIDE 10 MG/ML IJ SOLN
80.0000 mg | Freq: Two times a day (BID) | INTRAMUSCULAR | Status: DC
  Filled 2022-10-17: qty 8

## 2022-10-17 MED ORDER — FUROSEMIDE 10 MG/ML IJ SOLN
80.0000 mg | Freq: Two times a day (BID) | INTRAMUSCULAR | Status: DC
  Administered 2022-10-17 – 2022-10-19 (×5): 80 mg via INTRAVENOUS
  Filled 2022-10-17 (×4): qty 8

## 2022-10-17 MED ORDER — SODIUM CHLORIDE 0.9% FLUSH
3.0000 mL | Freq: Two times a day (BID) | INTRAVENOUS | Status: DC
  Administered 2022-10-17 (×2): 3 mL via INTRAVENOUS
  Administered 2022-10-18: 10 mL via INTRAVENOUS
  Administered 2022-10-18 – 2022-11-09 (×17): 3 mL via INTRAVENOUS

## 2022-10-17 MED ORDER — IPRATROPIUM-ALBUTEROL 0.5-2.5 (3) MG/3ML IN SOLN
3.0000 mL | Freq: Four times a day (QID) | RESPIRATORY_TRACT | Status: DC | PRN
  Administered 2022-10-26: 3 mL via RESPIRATORY_TRACT
  Filled 2022-10-17 (×2): qty 3

## 2022-10-17 MED ORDER — FERROUS SULFATE 325 (65 FE) MG PO TABS
325.0000 mg | ORAL_TABLET | Freq: Every day | ORAL | Status: DC
  Administered 2022-10-17 – 2022-11-12 (×27): 325 mg via ORAL
  Filled 2022-10-17 (×27): qty 1

## 2022-10-17 MED ORDER — PANTOPRAZOLE SODIUM 40 MG PO TBEC
40.0000 mg | DELAYED_RELEASE_TABLET | Freq: Every day | ORAL | Status: DC
  Administered 2022-10-17 – 2022-11-11 (×26): 40 mg via ORAL
  Filled 2022-10-17 (×26): qty 1

## 2022-10-17 MED ORDER — DOCUSATE SODIUM 100 MG PO CAPS
100.0000 mg | ORAL_CAPSULE | Freq: Every day | ORAL | Status: DC
  Administered 2022-10-18 – 2022-11-12 (×17): 100 mg via ORAL
  Filled 2022-10-17 (×26): qty 1

## 2022-10-17 MED ORDER — MESALAMINE 1.2 G PO TBEC
2.4000 g | DELAYED_RELEASE_TABLET | Freq: Two times a day (BID) | ORAL | Status: DC
  Administered 2022-10-17 – 2022-11-12 (×52): 2.4 g via ORAL
  Filled 2022-10-17 (×56): qty 2

## 2022-10-17 MED ORDER — POLYETHYLENE GLYCOL 3350 17 G PO PACK
17.0000 g | PACK | Freq: Every day | ORAL | Status: DC | PRN
  Administered 2022-10-18 – 2022-11-07 (×2): 17 g via ORAL
  Filled 2022-10-17 (×2): qty 1

## 2022-10-17 NOTE — Progress Notes (Addendum)
CSW contacted [name] with admissions at [facility] to see if they would be able to accept patient back. [name] stated yes but patient would not qualify through the emergency room department. CSW is unsure at this time if patient meets admission criteria.
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
## 2022-10-17 NOTE — Evaluation (Signed)
Physical Therapy Evaluation Patient Details Name: Gary [name] MRN: [id number] DOB: 06/20/1955 Today's Date: 10/17/2022  History of Present Illness  67 y.o. male presents to [facility] hospital on 10/16/2022 with reports of SOB and chest pain. Pt was in ED earlier this morning and elected to discharge home, however in route to home decided to return home for placement due to inadequate caregiver support. Of note, pt has had 4 admissions in the last 6 months, and 9 admissions in the last year. PMH includes obesity, hypoxia, respiratory failure with tracheostomy, COPD, GIB, HFpEF.  Clinical Impression  Pt presents to PT with deficits in activity tolerance, strength, power, endurance, gait, balance. Pt is unable to stand from lower surfaces, requiring significant elevation of the bed, >3 feet. Pt fatigues very quickly, requiring multiple seated rest breaks during session and only able to ambulate 3' due to impaired cardiopulmonary function. Pt is unable to care for himself at this time and has no reliable caregiver support. PT recommend short term inpatient PT services in an effort to improve endurance and to allow for independence in transfers from varying surface heights.        Assistance Recommended at Discharge Frequent or constant Supervision/Assistance  If plan is discharge home, recommend the following:  Can travel by private vehicle  A lot of help with walking and/or transfers;A lot of help with bathing/dressing/bathroom;Assistance with cooking/housework;Assist for transportation;Help with stairs or ramp for entrance   No    Equipment Recommendations None recommended by PT  Recommendations for Other Services       Functional Status Assessment Patient has had a recent decline in their functional status and demonstrates the ability to make significant improvements in function in a reasonable and predictable amount of time.     Precautions / Restrictions Precautions Precautions:  Fall Precaution Comments: trach Restrictions Weight Bearing Restrictions: No      Mobility  Bed Mobility Overal bed mobility: Needs Assistance Bed Mobility: Supine to Sit, Sit to Supine     Supine to sit: Min assist, HOB elevated Sit to supine: Min assist, HOB elevated        Transfers Overall transfer level: Needs assistance Equipment used: Rolling walker (2 wheels) Transfers: Sit to/from Stand Sit to Stand: Min assist, From elevated surface (significant elevation of bed, at least 36")                Ambulation/Gait Ambulation/Gait assistance: Min guard Gait Distance (Feet): 3 Feet (3' forward and backward, limited by fatigue) Assistive device: Rolling walker (2 wheels) Gait Pattern/deviations: Step-to pattern, Wide base of support Gait velocity: decreased Gait velocity interpretation: <1.31 ft/sec, indicative of household ambulator   General Gait Details: pt with slowed step-to gait, reduced step length bilaterally  Stairs            Wheelchair Mobility     Tilt Bed    Modified Rankin (Stroke Patients Only)       Balance Overall balance assessment: Needs assistance Sitting-balance support: No upper extremity supported, Feet supported Sitting balance-[name] Scale: Fair     Standing balance support: No upper extremity supported, During functional activity Standing balance-[name] Scale: Fair Standing balance comment: ~10 seconds static standing without UE support                             Pertinent Vitals/Pain Pain Assessment Pain Assessment: No/denies pain    Home Living Family/patient expects to be discharged to:: Private residence Living  Arrangements: Spouse/significant other Available Help at Discharge: Friend(s);Available PRN/intermittently Type of Home: Apartment Home Access: Level entry;Ramped entrance       Home Layout: One level Home Equipment: Wheelchair - Nurse, children's (2 wheels);Hospital  bed Additional Comments: 6/16 admission: pt reported having an aide 7a-11p 7d/wk. Pt now reporting he does not have any assist and his girlfriend is goes for "days" at a time.    Prior Function Prior Level of Function : Needs assist             Mobility Comments: pt reports he was able to ambulate for household distances with a RW for household distances until getting PNA about 1 month ago. since it has been very difficult to transfer and he fatigues very quickly ADLs Comments: Reports he needs assist for all aspects of ADLs and IADLs     Hand Dominance   Dominant Hand: Right    Extremity/Trunk Assessment   Upper Extremity Assessment Upper Extremity Assessment: Defer to OT evaluation    Lower Extremity Assessment Lower Extremity Assessment: Generalized weakness    Cervical / Trunk Assessment Cervical / Trunk Assessment: Other exceptions Cervical / Trunk Exceptions: morbid obesity  Communication   Communication: Passy-Muir valve;Tracheostomy;No difficulties  Cognition Arousal/Alertness: Awake/alert Behavior During Therapy: WFL for tasks assessed/performed Overall Cognitive Status: Within Functional Limits for tasks assessed                                          General Comments General comments (skin integrity, edema, etc.): pt on 8L 30% FiO2 trach collar throughout session, fatigues quickly with notable increased work of breathing with limited activity. Unreliable pleth reading during session    Exercises     Assessment/Plan    PT Assessment Patient needs continued PT services  PT Problem List Decreased strength;Cardiopulmonary status limiting activity;Decreased activity tolerance;Decreased balance;Decreased mobility       PT Treatment Interventions DME instruction;Gait training;Functional mobility training;Therapeutic activities;Therapeutic exercise;Balance training;Neuromuscular re-education;Patient/family education;Wheelchair mobility  training    PT Goals (Current goals can be found in the Care Plan section)  Acute Rehab PT Goals Patient Stated Goal: to improve ability to stand from lower surfaces PT Goal Formulation: With patient Time For Goal Achievement: 10/31/22 Potential to Achieve Goals: Fair    Frequency Min 3X/week     Co-evaluation               AM-PAC PT "6 Clicks" Mobility  Outcome Measure Help needed turning from your back to your side while in a flat bed without using bedrails?: A Little Help needed moving from lying on your back to sitting on the side of a flat bed without using bedrails?: A Little Help needed moving to and from a bed to a chair (including a wheelchair)?: A Lot Help needed standing up from a chair using your arms (e.g., wheelchair or bedside chair)?: A Lot Help needed to walk in hospital room?: Total Help needed climbing 3-5 steps with a railing? : Total 6 Click Score: 12    End of Session Equipment Utilized During Treatment: Oxygen Activity Tolerance: Patient tolerated treatment well Patient left: in bed;with call bell/phone within reach;with bed alarm set Nurse Communication: Mobility status PT Visit Diagnosis: Muscle weakness (generalized) (M62.81);Difficulty in walking, not elsewhere classified (R26.2)    Time: 2956-2130 PT Time Calculation (min) (ACUTE ONLY): 32 min   Charges:   PT Evaluation $PT Eval Low  Complexity: 1 Low   PT General Charges $$ ACUTE PT VISIT: 1 Visit         Arlyss Gandy, PT, DPT Acute Rehabilitation Office [phone]   Arlyss Gandy 10/17/2022, 10:46 AM

## 2022-10-17 NOTE — Progress Notes (Signed)
Patient is not ready to go on bipap at this time.

## 2022-10-17 NOTE — ED Notes (Signed)
Patient refuses to wear bp cuff.
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
## 2022-10-17 NOTE — ED Provider Notes (Signed)
Emergency Medicine Observation Re-evaluation Note  Gary [name] is a 67 y.o. male, seen on rounds today.  Pt initially presented to the ED for complaints of Failure To Thrive Currently, the patient is resting comfortably without any complaints.  Physical Exam  BP 113/68   Pulse 93   Temp 98.1 F (36.7 C) (Oral)   Resp 12   Ht 6\' 4"  (1.93 m)   Wt (!) 204.1 kg   SpO2 92%   BMI 54.78 kg/m  Physical Exam General: Sitting in the stretcher talking in nursing Lungs: Trach collar present.  Normal work of breathing Psych: Calm and cooperative  ED Course / MDM  EKG:   I have reviewed the labs performed to date as well as medications administered while in observation.  Recent changes in the last 24 hours include patient seen and placed in TOC boarder status.  Home medications.  To have been ordered.  Plan  Current plan is for [facility] consult and placement.  PT evaluation.    Rondel Baton, MD 10/17/22 [phone]

## 2022-10-17 NOTE — ED Provider Notes (Signed)
Reevaluated the patient after social work reports that he is not going to be a SNF candidate due to his oxygen requirement and weight.  Does have a slightly increased oxygen requirement at 30% FiO2 up from his baseline of 28% FiO2.  Says that over the past week his lower extremity swelling has gotten worse.  Has required IV diuresis that was discussed with Dr. Alinda Money for admission for IV diuresis.  This may also help him with placement at an LTAC since he cannot go to a SNF.  His old facility [facility] should be able to accept him back if he is admitted to the hospitalist.  With his increased oxygen requirement lower extremity edema Dr. Alinda Money has agreed to admit the patient.   Rondel Baton, MD 10/17/22 1314

## 2022-10-17 NOTE — Progress Notes (Signed)
CSW spoke with patient who stated he hasn't received home health from [place] at this time. CSW explained to patient that case management contacted [name] they are still needing the paperwork for private duty nursing signed by his girlfriend [name]. Patient stated his girlfriend is a crack head and is no good. Patient asked if his sister would be able to sign the form. CSW stated they would have to reach out to [place] and find out. Patient called his sister Melbourne Abts [phone] three times in the room with no answer. Patient stated his sister works third shift at [employer]. Patient is open to rehab at this time. Patient is also open to going back to [facility] if offered.
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
## 2022-10-18 DIAGNOSIS — K219 Gastro-esophageal reflux disease without esophagitis: Secondary | ICD-10-CM

## 2022-10-18 DIAGNOSIS — G4733 Obstructive sleep apnea (adult) (pediatric): Secondary | ICD-10-CM | POA: Diagnosis not present

## 2022-10-18 DIAGNOSIS — J449 Chronic obstructive pulmonary disease, unspecified: Secondary | ICD-10-CM

## 2022-10-18 DIAGNOSIS — I10 Essential (primary) hypertension: Secondary | ICD-10-CM

## 2022-10-18 DIAGNOSIS — I5033 Acute on chronic diastolic (congestive) heart failure: Secondary | ICD-10-CM

## 2022-10-18 DIAGNOSIS — F1910 Other psychoactive substance abuse, uncomplicated: Secondary | ICD-10-CM

## 2022-10-18 DIAGNOSIS — E785 Hyperlipidemia, unspecified: Secondary | ICD-10-CM

## 2022-10-18 DIAGNOSIS — Z6841 Body Mass Index (BMI) 40.0 and over, adult: Secondary | ICD-10-CM

## 2022-10-18 DIAGNOSIS — D509 Iron deficiency anemia, unspecified: Secondary | ICD-10-CM

## 2022-10-18 DIAGNOSIS — J441 Chronic obstructive pulmonary disease with (acute) exacerbation: Secondary | ICD-10-CM | POA: Diagnosis not present

## 2022-10-18 DIAGNOSIS — J9621 Acute and chronic respiratory failure with hypoxia: Secondary | ICD-10-CM

## 2022-10-18 LAB — COMPREHENSIVE METABOLIC PANEL WITH GFR
ALT: 21 U/L (ref 0–44)
AST: 13 U/L — ABNORMAL LOW (ref 15–41)
Albumin: 2.8 g/dL — ABNORMAL LOW (ref 3.5–5.0)
Alkaline Phosphatase: 96 U/L (ref 38–126)
Anion gap: 8 (ref 5–15)
BUN: 29 mg/dL — ABNORMAL HIGH (ref 8–23)
CO2: 31 mmol/L (ref 22–32)
Calcium: 7.2 mg/dL — ABNORMAL LOW (ref 8.9–10.3)
Chloride: 100 mmol/L (ref 98–111)
Creatinine, Ser: 1.2 mg/dL (ref 0.61–1.24)
GFR, Estimated: >60 mL/min
Glucose, Bld: 121 mg/dL — ABNORMAL HIGH (ref 70–99)
Potassium: 4.1 mmol/L (ref 3.5–5.1)
Sodium: 139 mmol/L (ref 135–145)
Total Bilirubin: 0.6 mg/dL (ref 0.3–1.2)
Total Protein: 6.1 g/dL — ABNORMAL LOW (ref 6.5–8.1)

## 2022-10-18 LAB — MAGNESIUM: Magnesium: 2 mg/dL (ref 1.7–2.4)

## 2022-10-18 LAB — CBC
HCT: 37.4 % — ABNORMAL LOW (ref 39.0–52.0)
Hemoglobin: 9.6 g/dL — ABNORMAL LOW (ref 13.0–17.0)
MCH: 21.3 pg — ABNORMAL LOW (ref 26.0–34.0)
MCHC: 25.7 g/dL — ABNORMAL LOW (ref 30.0–36.0)
MCV: 82.9 fL (ref 80.0–100.0)
Platelets: 131 10*3/uL — ABNORMAL LOW (ref 150–400)
RBC: 4.51 MIL/uL (ref 4.22–5.81)
RDW: 18.9 % — ABNORMAL HIGH (ref 11.5–15.5)
WBC: 6.8 10*3/uL (ref 4.0–10.5)
nRBC: 1.2 % — ABNORMAL HIGH (ref 0.0–0.2)

## 2022-10-18 NOTE — Progress Notes (Signed)
   10/18/22 0023  BiPAP/CPAP/SIPAP  $ Non-Invasive Home Ventilator  Subsequent  BiPAP/CPAP/SIPAP Pt Type Adult  BiPAP/CPAP/SIPAP Resmed (Patient home bipap)  FiO2 (%) 36 %  Patient Home Equipment Yes  BiPAP/CPAP /SiPAP Vitals  SpO2 97 %   Patient is on home vent.

## 2022-10-18 NOTE — Progress Notes (Signed)
Rt went to place pt on his home bipap, pt stated he wanted to wait a little while longer before being place on machine.

## 2022-10-18 NOTE — Progress Notes (Signed)
PROGRESS NOTE    Gary [name]  ZOX:[id number] DOB: July 12, 1955 DOA: 10/16/2022 PCP: Eloisa Northern, MD   Brief Narrative:  Gary [name] is a 67 y.o. male with medical history significant of diverticulosis, OSA, OHS, obesity, hypertension, hyperlipidemia, GERD, substance use, depression, anxiety, anemia, COPD, rare chronic respiratory failure with hypoxia, CKD 3A, diastolic CHF presenting with shortness of breath -found to be profoundly hypoxic in the ED from baseline.  Hospitalist called for admission.   Assessment & Plan:   Active Problems:   Major depressive disorder, recurrent episode, moderate ([name])   Acute on chronic diastolic CHF (congestive heart failure) ([name])   COPD (chronic obstructive pulmonary disease) ([name])   HTN (hypertension)   OSA (obstructive sleep apnea)   Chronic obstructive pulmonary disease (COPD) ([name])   Morbid obesity with BMI of 50.0-59.9, adult ([name])   GERD without esophagitis   Anxiety and depression   Iron deficiency anemia   Polysubstance abuse ([name])   Hyperlipidemia   Acute on chronic respiratory failure with hypoxia ([name])   Acute on chronic respiratory failure with hypoxia COPD, questionably in exacerbation Acute on chronic chronic diastolic CHF Obstructive sleep apnea, obesity hypoventilation syndrome, chronic -Patient chronically hypoxic via trach collar at 6 L, requiring upwards of 10 L here in the ED despite diuretics and respiratory treatments, nebulizers. -Continue multifaceted treatment approach with diuretics, nebulizers, supplemental oxygen -Continue steroids, nebs, strict I's and O's -Recent echo shows EF was 65-70%, normal diastolic function, normal RV function. -Continue home CPAP    Hypertension/hypotension -Borderline hypotensive, continue diuretics, midodrine  Hyperlipidemia -Would recommend statin, unclear if patient has intolerance, no allergies listed   Rule out acute versus chronic colitis/Diverticulitis -Chronic colitis  versus segmental colitis associated with diverticulosis -Continue mesalamine   DVT History of GI bleed Iron deficiency anemia -Continues on Eliquis 2.5 mg twice daily -held earlier this year in March due to an acute GI bleed -Chronic iron deficiency anemia, at baseline - Continue PPI   GERD - Continue home PPI   Obesity Body mass index is 49.54 kg/m.  DVT prophylaxis: Eliquis Code Status:   Code Status: Full Code Family Communication: None present  Status is: Inpatient  Dispo: The patient is from: Home              Anticipated d/c is to: LTAC              Anticipated d/c date is: 48 to 72 hours              Patient currently not medically stable for discharge  Consultants:  None  Procedures:  None  Antimicrobials:  None  Subjective: No acute issues or events overnight denies nausea vomiting diarrhea constipation any fevers chills or chest pain  Objective: Vitals:   10/17/22 2306 10/18/22 0023 10/18/22 0446 10/18/22 0741  BP:   129/70 113/79  Pulse:   90 86  Resp:   (!) 27 (!) 22  Temp: 98 F (36.7 C)  98 F (36.7 C) 98.7 F (37.1 C)  TempSrc: Oral  Oral Oral  SpO2:  97% 92% 94%  Weight:   (!) 184.6 kg   Height:        Intake/Output Summary (Last 24 hours) at 10/18/2022 0756 Last data filed at 10/18/2022 0456 Gross per 24 hour  Intake --  Output 1450 ml  Net -1450 ml   Filed Weights   10/16/22 2204 10/18/22 0446  Weight: (!) 204.1 kg (!) 184.6 kg    Examination:  General:  Somnolent but easily arousable, no acute distress. HEENT:  Normocephalic atraumatic.  Sclerae nonicteric, noninjected.  Extraocular movements intact bilaterally. Neck: Tracheostomy clean dry intact. Lungs: Diminished bilaterally without overt wheeze or rhonchi. Heart:  Regular rate and rhythm.  Without murmurs, rubs, or gallops. Abdomen:  Soft, nontender, nondistended.  Without guarding or rebound. Extremities: Without cyanosis, clubbing, or obvious deformity.  1+ pitting edema  bilateral lower extremity Skin:  Warm and dry, no erythema.  Data Reviewed: I have personally reviewed following labs and imaging studies  CBC: Recent Labs  Lab 10/16/22 2330 10/17/22 1326 10/18/22 0029  WBC 8.1 7.6 6.8  NEUTROABS 5.8  --   --   HGB 9.3* 9.5* 9.6*  HCT 35.5* 37.3* 37.4*  MCV 80.5 82.7 82.9  PLT 145* 145* 131*   Basic Metabolic Panel: Recent Labs  Lab 10/16/22 2330 10/17/22 1326 10/18/22 0029  NA 141 140 139  K 4.8 4.1 4.1  CL 98 99 100  CO2 31 33* 31  GLUCOSE 76 123* 121*  BUN 31* 25* 29*  CREATININE 1.11 1.18 1.20  CALCIUM 7.7* 7.5* 7.2*  MG  --   --  2.0   GFR: Estimated Creatinine Clearance: 109.3 mL/min (by C-G formula based on SCr of 1.2 mg/dL). Liver Function Tests: Recent Labs  Lab 10/18/22 0029  AST 13*  ALT 21  ALKPHOS 96  BILITOT 0.6  PROT 6.1*  ALBUMIN 2.8*    No results found for this or any previous visit (from the past 240 hour(s)).    Radiology Studies: DG Chest Port 1 View  Result Date: 10/16/2022 CLINICAL DATA:  Chest pain. EXAM: PORTABLE CHEST 1 VIEW COMPARISON:  09/29/2022. FINDINGS: Heart is enlarged and the mediastinal contour is within normal limits. Lung volumes are low. No consolidation, effusion, or pneumothorax. No acute osseous abnormality. An endotracheal tube terminates 5.3 cm above the carina. IMPRESSION: 1. No active disease. 2. Cardiomegaly. Electronically Signed   By: Thornell Sartorius M.D.   On: 10/16/2022 22:53    Scheduled Meds:  apixaban  2.5 mg Oral BID   docusate sodium  100 mg Oral Daily   ferrous sulfate  325 mg Oral Q breakfast   furosemide  80 mg Intravenous BID   mesalamine  2.4 g Oral BID   midodrine  10 mg Oral BID WC   pantoprazole  40 mg Oral QHS   predniSONE  10 mg Oral Q breakfast   sodium chloride flush  3 mL Intravenous Q12H   Continuous Infusions:   LOS: 1 day   Time spent:  Azucena Fallen, DO [facility]  If 7PM-7AM, please contact  night-coverage [email or web address]  10/18/2022, 7:56 AM

## 2022-10-19 DIAGNOSIS — I5033 Acute on chronic diastolic (congestive) heart failure: Secondary | ICD-10-CM | POA: Diagnosis not present

## 2022-10-19 DIAGNOSIS — J441 Chronic obstructive pulmonary disease with (acute) exacerbation: Secondary | ICD-10-CM | POA: Diagnosis not present

## 2022-10-19 DIAGNOSIS — I10 Essential (primary) hypertension: Secondary | ICD-10-CM | POA: Diagnosis not present

## 2022-10-19 DIAGNOSIS — G4733 Obstructive sleep apnea (adult) (pediatric): Secondary | ICD-10-CM | POA: Diagnosis not present

## 2022-10-19 LAB — BASIC METABOLIC PANEL
Anion gap: 8 (ref 5–15)
BUN: 24 mg/dL — ABNORMAL HIGH (ref 8–23)
CO2: 33 mmol/L — ABNORMAL HIGH (ref 22–32)
Calcium: 7.6 mg/dL — ABNORMAL LOW (ref 8.9–10.3)
Chloride: 101 mmol/L (ref 98–111)
Creatinine, Ser: 1.21 mg/dL (ref 0.61–1.24)
GFR, Estimated: >60 mL/min (ref >60)
Glucose, Bld: 71 mg/dL (ref 70–99)
Potassium: 4.8 mmol/L (ref 3.5–5.1)
Sodium: 142 mmol/L (ref 135–145)

## 2022-10-19 LAB — CBC
HCT: 39.3 % (ref 39.0–52.0)
Hemoglobin: 9.7 g/dL — ABNORMAL LOW (ref 13.0–17.0)
MCH: 20.7 pg — ABNORMAL LOW (ref 26.0–34.0)
MCHC: 24.7 g/dL — ABNORMAL LOW (ref 30.0–36.0)
MCV: 83.8 fL (ref 80.0–100.0)
Platelets: 119 10*3/uL — ABNORMAL LOW (ref 150–400)
RBC: 4.69 MIL/uL (ref 4.22–5.81)
RDW: 19.1 % — ABNORMAL HIGH (ref 11.5–15.5)
WBC: 5.8 10*3/uL (ref 4.0–10.5)
nRBC: 0.9 % — ABNORMAL HIGH (ref 0.0–0.2)

## 2022-10-19 MED ORDER — MAGNESIUM CITRATE PO SOLN
1.0000 | Freq: Once | ORAL | Status: AC
  Administered 2022-10-19: 1 via ORAL
  Filled 2022-10-19: qty 296

## 2022-10-19 NOTE — Progress Notes (Signed)
   10/19/22 0051  BiPAP/CPAP/SIPAP  BiPAP/CPAP/SIPAP Pt Type Adult  BiPAP/CPAP/SIPAP Resmed  FiO2 (%) 35 %  Patient Home Equipment Yes  Auto Titrate No

## 2022-10-19 NOTE — Progress Notes (Signed)
PROGRESS NOTE    Gary [name]  ZOX:[id number] DOB: 08/02/1955 DOA: 10/16/2022 PCP: Eloisa Northern, MD   Brief Narrative:  Gary [name] is a 67 y.o. male with medical history significant of diverticulosis, OSA, OHS, obesity, hypertension, hyperlipidemia, GERD, substance use, depression, anxiety, anemia, COPD, rare chronic respiratory failure with hypoxia, CKD 3A, diastolic CHF presenting with shortness of breath -found to be profoundly hypoxic in the ED from baseline.  Hospitalist called for admission.   Assessment & Plan:   Active Problems:   Major depressive disorder, recurrent episode, moderate ([name])   Acute on chronic diastolic CHF (congestive heart failure) ([name])   COPD (chronic obstructive pulmonary disease) ([name])   HTN (hypertension)   OSA (obstructive sleep apnea)   Chronic obstructive pulmonary disease (COPD) ([name])   Morbid obesity with BMI of 50.0-59.9, adult ([name])   GERD without esophagitis   Anxiety and depression   Iron deficiency anemia   Polysubstance abuse ([name])   Hyperlipidemia   Acute on chronic respiratory failure with hypoxia ([name])   Acute on chronic respiratory failure with hypoxia COPD, questionably in exacerbation Acute on chronic chronic diastolic CHF Obstructive sleep apnea, obesity hypoventilation syndrome, chronic -Patient chronically hypoxic via trach collar at 6 L per report, requiring upwards of 10 L here in the ED despite diuretics and respiratory treatments, nebulizers. -Continue multifaceted treatment approach with diuretics, nebulizers, supplemental oxygen -Continue steroids, nebs, strict I's and O's -Recent echo shows EF was 65-70%, normal diastolic function, normal RV function. -Continue home CPAP    Hypertension/hypotension -Borderline hypotensive, continue diuretics, midodrine  Hyperlipidemia -Would recommend statin, unclear if patient has intolerance, no allergies listed   Rule out acute versus chronic colitis/Diverticulitis -Chronic  colitis versus segmental colitis associated with diverticulosis -Continue mesalamine   DVT History of GI bleed Iron deficiency anemia -Continues on Eliquis 2.5 mg twice daily -held earlier this year in March due to an acute GI bleed which has since resolved and not recurred -Chronic iron deficiency anemia, at baseline - Continue PPI   GERD - Continue home PPI   Obesity Body mass index is 49.16 kg/m.  DVT prophylaxis: Eliquis Code Status:   Code Status: Full Code Family Communication: None present  Status is: Inpatient  Dispo: The patient is from: Home              Anticipated d/c is to: LTAC              Anticipated d/c date is: 48 to 72 hours              Patient currently not medically stable for discharge  Consultants:  None  Procedures:  None  Antimicrobials:  None  Subjective: No acute issues or events overnight, patient continues to be hypoxic compared to baseline, somewhat noncompliant today with physical therapy/ambulation but ultimately did participate with ambulatory oxygen screen showing patient still requires upwards of 10 L nasal cannula with exertion.  Objective: Vitals:   10/19/22 0500 10/19/22 0532 10/19/22 0539 10/19/22 0723  BP:   129/63 (!) 106/59  Pulse:   82 84  Resp: 18 19 18 19   Temp:   98.4 F (36.9 C) 98.3 F (36.8 C)  TempSrc:   Oral Oral  SpO2:   96% 96%  Weight:   (!) 183.2 kg   Height:        Intake/Output Summary (Last 24 hours) at 10/19/2022 0727 Last data filed at 10/18/2022 2244 Gross per 24 hour  Intake 960 ml  Output 3151  ml  Net -2191 ml    Filed Weights   10/16/22 2204 10/18/22 0446 10/19/22 0539  Weight: (!) 204.1 kg (!) 184.6 kg (!) 183.2 kg    Examination:  General: Somnolent but easily arousable, no acute distress. HEENT:  Normocephalic atraumatic.  Sclerae nonicteric, noninjected.  Extraocular movements intact bilaterally. Neck: Tracheostomy clean dry intact. Lungs: Diminished bilaterally without overt  wheeze or rhonchi. Heart:  Regular rate and rhythm.  Without murmurs, rubs, or gallops. Abdomen:  Soft, nontender, nondistended.  Without guarding or rebound. Extremities: Without cyanosis, clubbing, or obvious deformity.  1+ pitting edema bilateral lower extremity Skin:  Warm and dry, no erythema.  Data Reviewed: I have personally reviewed following labs and imaging studies  CBC: Recent Labs  Lab 10/16/22 2330 10/17/22 1326 10/18/22 0029 10/19/22 0041  WBC 8.1 7.6 6.8 5.8  NEUTROABS 5.8  --   --   --   HGB 9.3* 9.5* 9.6* 9.7*  HCT 35.5* 37.3* 37.4* 39.3  MCV 80.5 82.7 82.9 83.8  PLT 145* 145* 131* 119*    Basic Metabolic Panel: Recent Labs  Lab 10/16/22 2330 10/17/22 1326 10/18/22 0029 10/19/22 0041  NA 141 140 139 142  K 4.8 4.1 4.1 4.8  CL 98 99 100 101  CO2 31 33* 31 33*  GLUCOSE 76 123* 121* 71  BUN 31* 25* 29* 24*  CREATININE 1.11 1.18 1.20 1.21  CALCIUM 7.7* 7.5* 7.2* 7.6*  MG  --   --  2.0  --     GFR: Estimated Creatinine Clearance: 108 mL/min (by C-G formula based on SCr of 1.21 mg/dL). Liver Function Tests: Recent Labs  Lab 10/18/22 0029  AST 13*  ALT 21  ALKPHOS 96  BILITOT 0.6  PROT 6.1*  ALBUMIN 2.8*     No results found for this or any previous visit (from the past 240 hour(s)).    Radiology Studies: No results found.  Scheduled Meds:  apixaban  2.5 mg Oral BID   docusate sodium  100 mg Oral Daily   ferrous sulfate  325 mg Oral Q breakfast   furosemide  80 mg Intravenous BID   mesalamine  2.4 g Oral BID   midodrine  10 mg Oral BID WC   pantoprazole  40 mg Oral QHS   predniSONE  10 mg Oral Q breakfast   sodium chloride flush  3 mL Intravenous Q12H   Continuous Infusions:   LOS: 2 days   Time spent:  Azucena Fallen, DO [facility]  If 7PM-7AM, please contact night-coverage [email or web address]  10/19/2022, 7:27 AM

## 2022-10-19 NOTE — Progress Notes (Signed)
Attempted to place Pt on 6L 28% but Pt Spo2 dropped to lower 80s. Pt placed on 35% 10L and is tolerating at this time.  10/19/22 1612  Oxygen Therapy/Pulse Ox  O2 Device Tracheostomy Collar  O2 Therapy Oxygen humidified  O2 Flow Rate (L/min) 6 L/min  FiO2 (%) (S)  28 %

## 2022-10-19 NOTE — Progress Notes (Signed)
Heart Failure Navigator Progress Note  Assessed for Heart & Vascular TOC clinic readiness.  Patient does not meet criteria due to EF 65-70%, has Janina Mayo, will be discharged to [facility].   Navigator will sign off at this time.   Rhae Hammock, BSN, Scientist, clinical (histocompatibility and immunogenetics) Only

## 2022-10-19 NOTE — Progress Notes (Signed)
PT Cancellation Note  Patient Details Name: Gary [name] MRN: [id number] DOB: May 12, 1955   Cancelled Treatment:    Reason Eval/Treat Not Completed: (P) Other (comment) (Pt on bed pan and lunch tray has arrived, will defer PT at this time and follow up per POC.)   Marlis Oldaker J Aundria Rud 10/19/2022, 12:23 PM  Bonney Leitz , PTA Acute Rehabilitation Services Office [phone]

## 2022-10-19 NOTE — Progress Notes (Signed)
Pt pulled trach out, RN was able to put trach back in place with no trouble. End tidal Co2 had good color change, vital sign stable. Pt is back on bipap and resting comfortably at this time.

## 2022-10-19 NOTE — TOC Initial Note (Signed)
Transition of Care [facility]) - Initial/Assessment Note    Patient Details  Name: Gary [name] MRN: [id number] Date of Birth: Apr 23, 1955  Transition of Care [facility]) CM/SW Contact:    Leander Rams, LCSW Phone Number: 10/19/2022, 11:50 AM  Clinical Narrative:                 CSW spoke with pt to follow up to see if he is still agreeable to dc to [facility]. Pt states that he is open to go. CSW spoke with [name] with [facility] [phone]). [name] states they can accept pt and will be able to admit him tomorrow. CSW messaged MD to see when pt will likely dc.   11:30 [name] called CSW to inform they likely can't take pt due to recent change in insurance. [facility] believes pt will likely get denied for LTAC.   CSW informed pt of updates. Pt states if he can't dc to [facility], he wants to go home with [facility]. Pt states he has all of the DME he needs and lives at home with his girlfriend.   Berkley Harvey will be started [facility] as pt gets closer to being medically stable for dc. If Berkley Harvey is denied, pt will dc home.   TOC will continue to follow this admission.   Expected Discharge Plan: Skilled Nursing Facility Barriers to Discharge: Continued Medical Work up   Patient Goals and CMS Choice Patient states their goals for this hospitalization and ongoing recovery are:: [facility] to [facility]          Expected Discharge Plan and Services       Living arrangements for the past 2 months: Single Family Home                                      Prior Living Arrangements/Services Living arrangements for the past 2 months: Single Family Home Lives with:: Significant Other Patient language and need for interpreter reviewed:: Yes Do you feel safe going back to the place where you live?: Yes      Need for Family Participation in Patient Care: Yes (Comment) Care giver support system in place?: Yes (comment) Current home services: DME Criminal Activity/Legal Involvement Pertinent to Current Situation/Hospitalization:  No - Comment as needed  Activities of Daily Living Home Assistive Devices/Equipment: Vent/Trach supplies ADL Screening (condition at time of admission) Patient's cognitive ability adequate to safely complete daily activities?: Yes Is the patient deaf or have difficulty hearing?: No Does the patient have difficulty seeing, even when wearing glasses/contacts?: No Does the patient have difficulty concentrating, remembering, or making decisions?: No Patient able to express need for assistance with ADLs?: Yes Does the patient have difficulty dressing or bathing?: No Independently performs ADLs?: No Communication: Independent Does the patient have difficulty walking or climbing stairs?: Yes Weakness of Legs: Both Weakness of Arms/Hands: None  Permission Sought/Granted                  Emotional Assessment Appearance:: Developmentally appropriate Attitude/Demeanor/Rapport: Engaged Affect (typically observed): Calm Orientation: : Oriented to Self, Oriented to Place, Oriented to  Time, Oriented to Situation Alcohol / Substance Use: Not Applicable Psych Involvement: No (comment)  Admission diagnosis:  Acute on chronic respiratory failure with hypoxia ([name]) [J96.21] Chronic shortness of breath [R06.02] Patient Active Problem List   Diagnosis Date Noted   Acute on chronic respiratory failure with hypoxia ([name]) 10/17/2022   Chronic colitis 09/12/2022  Segmental colitis associated with diverticulosis ([name]) 09/12/2022   Diverticulosis of colon with hemorrhage 09/10/2022   Lower GI bleed 09/07/2022   Influenza A 04/11/2022   Hypokalemia 03/06/2022   Chronic obstructive pulmonary disease (COPD) ([name]) 02/03/2022   GERD without esophagitis 02/03/2022   Anxiety and depression 02/03/2022   Hematochezia    Major depressive disorder, recurrent episode, moderate ([name]) 10/22/2021   Pressure injury of skin 09/27/2021   COPD (chronic obstructive pulmonary disease) ([name])    Iron deficiency  anemia    Chronic kidney disease, stage 3a ([name]) 07/22/2021   Weakness    (HFpEF) heart failure with preserved ejection fraction ([name])    Chronic respiratory failure with hypoxia ([name])    Hyperlipidemia 02/19/2021   Generalized osteoarthritis of multiple sites 10/31/2020   Hyperkalemia 03/04/2020   Marijuana use 01/17/2020   Thrombocytopenia ([name]) 01/17/2020   Positive hepatitis C antibody test 01/17/2020   Osteoarthritis of glenohumeral joints (Bilateral) 01/17/2020   Osteoarthritis of AC (acromioclavicular) joints (Bilateral) 01/17/2020   Polysubstance abuse ([name]) 08/25/2019   Toxic metabolic encephalopathy 08/25/2019   Chronic pain syndrome 07/14/2019   Anticoagulated 07/14/2019   DDD (degenerative disc disease), lumbosacral 07/14/2019   DDD (degenerative disc disease), cervical 07/14/2019   Acute on chronic diastolic CHF (congestive heart failure) ([name]) 02/28/2015   OSA (obstructive sleep apnea) 02/28/2015   Morbid obesity with BMI of 50.0-59.9, adult ([name]) 04/01/2014   HTN (hypertension) 04/01/2014   PCP:  Eloisa Northern, MD Pharmacy:   [facility] - [place], [place] - [address] [place] [address] Phone: [phone] Fax: [phone]     Social Determinants of Health (SDOH) Social History: SDOH Screenings   Food Insecurity: No Food Insecurity (10/18/2022)  Housing: Patient Declined (10/18/2022)  Transportation Needs: No Transportation Needs (10/18/2022)  Recent Concern: Transportation Needs - Unmet Transportation Needs (08/13/2022)  Utilities: Not At Risk (10/18/2022)  Depression (PHQ2-9): Low Risk  (06/07/2021)  Financial Resource Strain: Medium Risk (05/03/2017)  Physical Activity: Insufficiently Active (05/03/2017)  Social Connections: Moderately Integrated (05/03/2017)  Stress: No Stress Concern Present (05/03/2017)  Tobacco Use: Medium Risk (10/17/2022)   SDOH Interventions:     Readmission Risk Interventions    12/31/2021    8:58 AM  12/20/2021    4:21 PM 05/04/2021    1:26 PM  Readmission Risk Prevention Plan  Transportation Screening Complete Complete Complete  Medication Review Oceanographer) Complete Complete Complete  PCP or Specialist appointment within 3-5 days of discharge Complete Complete Complete  HRI or Home Care Consult Complete Complete Complete  SW Recovery Care/Counseling Consult Complete Complete Complete  Palliative Care Screening Complete Complete Not Applicable  Skilled Nursing Facility Not Applicable Not Applicable Not Applicable   Oletta Lamas, MSW, LCSWA, LCASA Transitions of Care  Clinical Social Worker I

## 2022-10-19 NOTE — Progress Notes (Signed)
SATURATION QUALIFICATIONS: (This note is used to comply with regulatory documentation for home oxygen)  Patient Saturations on Room Air at Rest = unable%  Patient Saturations on Room Air while Ambulating = unable%  Patient Saturations on 3L Liters of oxygen while Ambulating = 72%  Patient Saturations on 6L Liters of oxygen while Ambulating = 77%  Patient Saturations on 10L Liters of oxygen while Ambulating = 92%   Please briefly explain why patient needs home oxygen:d/c with activity not able to return home on 6L or less due to hypoxia.    Gary [name] Acute Rehabilitation Services Office [phone]

## 2022-10-19 NOTE — Progress Notes (Signed)
Physical Therapy Treatment Patient Details Name: Gary [name] MRN: [id number] DOB: Aug 27, 1955 Today's Date: 10/19/2022   History of Present Illness 67 y.o. male presents to [facility] hospital on 10/16/2022 with reports of SOB and chest pain. Pt was in ED earlier this morning and elected to discharge home, however in route to home decided to return home for placement due to inadequate caregiver support. Of note, pt has had 4 admissions in the last 6 months, and 9 admissions in the last year. PMH includes obesity, hypoxia, respiratory failure with tracheostomy, COPD, GIB, HFpEF.    PT Comments  Pt unable to perform PT x2 today for needs to be on bed pan.  ON 3rd attempt he is able to mobilize with PTA.  Pt continues to require bed in significant elevated position to mobilize.  He required O2 increased from 3L( home level) to 10 L to maintain SPO2. See previous walking O2 note.  Will continue to recommend rehab in a post acute setting to improve strength and function before returning home.        Assistance Recommended at Discharge Frequent or constant Supervision/Assistance  If plan is discharge home, recommend the following:  Can travel by private vehicle    A lot of help with walking and/or transfers;A lot of help with bathing/dressing/bathroom;Assistance with cooking/housework;Assist for transportation;Help with stairs or ramp for entrance   No  Equipment Recommendations  None recommended by PT    Recommendations for Other Services       Precautions / Restrictions Precautions Precautions: Fall Precaution Comments: trach Restrictions Weight Bearing Restrictions: No RLE Weight Bearing: Weight bearing as tolerated     Mobility  Bed Mobility Overal bed mobility: Needs Assistance Bed Mobility: Supine to Sit, Sit to Supine Rolling: Modified independent (Device/Increase time)   Supine to sit: Min assist, HOB elevated Sit to supine: Mod assist   General bed mobility comments: Pt required  increased assistance to move back to bed and lift B LEs againste gravity into his bed.    Transfers Overall transfer level: Needs assistance Equipment used: Rolling walker (2 wheels) Transfers: Sit to/from Stand Sit to Stand: Min assist, From elevated surface (significant elevation in which feet are barely touching the floor.)           General transfer comment: Cues for hand placement and able to rise into standing and maintain standing.    Ambulation/Gait Ambulation/Gait assistance: Min guard Gait Distance (Feet): 3 Feet (side steps along side of bed this session.) Assistive device: Rolling walker (2 wheels) Gait Pattern/deviations: Step-to pattern, Wide base of support Gait velocity: decreased     General Gait Details: Pt unable to move away from bed due to weakness and poor endurance.   Stairs             Wheelchair Mobility     Tilt Bed    Modified Rankin (Stroke Patients Only)       Balance Overall balance assessment: Needs assistance Sitting-balance support: No upper extremity supported, Feet supported Sitting balance-[name] Scale: Fair       Standing balance-[name] Scale: Fair Standing balance comment: ~10 seconds static standing without UE support                            Cognition Arousal/Alertness: Awake/alert Behavior During Therapy: WFL for tasks assessed/performed, Agitated, Anxious Overall Cognitive Status: Within Functional Limits for tasks assessed  General Comments: Pt reports if you turn my oxygen down it will drop.  I know."  Pt anxious about d/c home and innability to care for himself        Exercises      General Comments        Pertinent Vitals/Pain Pain Assessment Pain Assessment: No/denies pain    Home Living                          Prior Function            PT Goals (current goals can now be found in the care plan section) Acute Rehab PT  Goals Patient Stated Goal: get stronger and go home Potential to Achieve Goals: Fair Progress towards PT goals: Progressing toward goals    Frequency    Min 3X/week      PT Plan Current plan remains appropriate    Co-evaluation              AM-PAC PT "6 Clicks" Mobility   Outcome Measure  Help needed turning from your back to your side while in a flat bed without using bedrails?: A Little Help needed moving from lying on your back to sitting on the side of a flat bed without using bedrails?: A Little Help needed moving to and from a bed to a chair (including a wheelchair)?: A Lot Help needed standing up from a chair using your arms (e.g., wheelchair or bedside chair)?: A Lot Help needed to walk in hospital room?: A Lot Help needed climbing 3-5 steps with a railing? : Total 6 Click Score: 13    End of Session Equipment Utilized During Treatment: Oxygen Activity Tolerance: Patient limited by lethargy;Patient limited by fatigue Patient left: in bed;with call bell/phone within reach (placed in reverse trendelenberg to improve positioning in bed.) Nurse Communication: Mobility status (o2 sats see walking O2 notes.) PT Visit Diagnosis: Muscle weakness (generalized) (M62.81);Difficulty in walking, not elsewhere classified (R26.2)     Time: 1610-9604 PT Time Calculation (min) (ACUTE ONLY): 20 min  Charges:    $Therapeutic Activity: 8-22 mins PT General Charges $$ ACUTE PT VISIT: 1 Visit                     Bonney Leitz , PTA Acute Rehabilitation Services Office [phone]    Florestine Avers 10/19/2022, 4:09 PM

## 2022-10-19 NOTE — Plan of Care (Signed)
Discharging to [facility].

## 2022-10-19 NOTE — Progress Notes (Signed)
PT Cancellation Note  Patient Details Name: Gary [name] MRN: [id number] DOB: 18-Feb-1956   Cancelled Treatment:    Reason Eval/Treat Not Completed: (P) Patient declined, no reason specified (Pt initially agreeable at first to participate in PT session.  As PTA finished setting up the room for tx he refused to get up edge of bed.  He reports he needed the bed pan.  Unable to mobilize at this time.)   Verna Desrocher Artis Delay 10/19/2022, 3:13 PM  Bonney Leitz , PTA Acute Rehabilitation Services Office [phone]

## 2022-10-20 DIAGNOSIS — I10 Essential (primary) hypertension: Secondary | ICD-10-CM | POA: Diagnosis not present

## 2022-10-20 DIAGNOSIS — I5033 Acute on chronic diastolic (congestive) heart failure: Secondary | ICD-10-CM | POA: Diagnosis not present

## 2022-10-20 DIAGNOSIS — J441 Chronic obstructive pulmonary disease with (acute) exacerbation: Secondary | ICD-10-CM | POA: Diagnosis not present

## 2022-10-20 DIAGNOSIS — G4733 Obstructive sleep apnea (adult) (pediatric): Secondary | ICD-10-CM | POA: Diagnosis not present

## 2022-10-20 DIAGNOSIS — J9621 Acute and chronic respiratory failure with hypoxia: Secondary | ICD-10-CM | POA: Diagnosis not present

## 2022-10-20 LAB — CBC
HCT: 38 % — ABNORMAL LOW (ref 39.0–52.0)
Hemoglobin: 9.4 g/dL — ABNORMAL LOW (ref 13.0–17.0)
MCH: 20.5 pg — ABNORMAL LOW (ref 26.0–34.0)
MCHC: 24.7 g/dL — ABNORMAL LOW (ref 30.0–36.0)
MCV: 83 fL (ref 80.0–100.0)
Platelets: 113 10*3/uL — ABNORMAL LOW (ref 150–400)
RBC: 4.58 MIL/uL (ref 4.22–5.81)
RDW: 18.6 % — ABNORMAL HIGH (ref 11.5–15.5)
WBC: 5.9 10*3/uL (ref 4.0–10.5)
nRBC: 0.5 % — ABNORMAL HIGH (ref 0.0–0.2)

## 2022-10-20 LAB — BASIC METABOLIC PANEL
Anion gap: 7 (ref 5–15)
BUN: 26 mg/dL — ABNORMAL HIGH (ref 8–23)
CO2: 37 mmol/L — ABNORMAL HIGH (ref 22–32)
Calcium: 7.8 mg/dL — ABNORMAL LOW (ref 8.9–10.3)
Chloride: 96 mmol/L — ABNORMAL LOW (ref 98–111)
Creatinine, Ser: 1.38 mg/dL — ABNORMAL HIGH (ref 0.61–1.24)
GFR, Estimated: 57 mL/min — ABNORMAL LOW (ref >60)
Glucose, Bld: 105 mg/dL — ABNORMAL HIGH (ref 70–99)
Potassium: 4 mmol/L (ref 3.5–5.1)
Sodium: 140 mmol/L (ref 135–145)

## 2022-10-20 MED ORDER — ARFORMOTEROL TARTRATE 15 MCG/2ML IN NEBU
15.0000 ug | INHALATION_SOLUTION | Freq: Two times a day (BID) | RESPIRATORY_TRACT | Status: DC
  Administered 2022-10-20 – 2022-11-12 (×46): 15 ug via RESPIRATORY_TRACT
  Filled 2022-10-20 (×47): qty 2

## 2022-10-20 MED ORDER — FUROSEMIDE 40 MG PO TABS
80.0000 mg | ORAL_TABLET | Freq: Two times a day (BID) | ORAL | Status: DC
  Administered 2022-10-20 – 2022-10-23 (×7): 80 mg via ORAL
  Filled 2022-10-20 (×7): qty 2

## 2022-10-20 MED ORDER — REVEFENACIN 175 MCG/3ML IN SOLN
175.0000 ug | Freq: Every day | RESPIRATORY_TRACT | Status: DC
  Administered 2022-10-20 – 2022-11-12 (×21): 175 ug via RESPIRATORY_TRACT
  Filled 2022-10-20 (×27): qty 3

## 2022-10-20 MED ORDER — BUDESONIDE 0.5 MG/2ML IN SUSP
0.5000 mg | Freq: Two times a day (BID) | RESPIRATORY_TRACT | Status: DC
  Administered 2022-10-20 – 2022-11-12 (×46): 0.5 mg via RESPIRATORY_TRACT
  Filled 2022-10-20 (×47): qty 2

## 2022-10-20 MED ORDER — BUDESONIDE 0.25 MG/2ML IN SUSP: 0.2500 mg | Freq: Two times a day (BID) | RESPIRATORY_TRACT | Status: DC

## 2022-10-20 NOTE — Progress Notes (Signed)
   10/20/22 0044  Tracheostomy Shiley XLT Proximal 7 mm Uncuffed  Placement Date/Time: 10/05/22 1457   Serial / Lot #: [id number]  Expiration Date: 11/04/22  Placed By: Self  Brand: Shiley XLT Proximal  Size (mm): 7 mm  Style: Uncuffed  Status Secured with trach ties  Site Assessment Clean;Dry  Ties Assessment Clean, Dry  Cuff Pressure (cm H2O)  (cuffless)  Tracheostomy Equipment at bedside Yes and checklist posted at head of bed  Adult Ventilator Measurements  SpO2 98 %   Placed patient on home astral, VSS. Will cont to monitor

## 2022-10-20 NOTE — Progress Notes (Signed)
Physical Therapy Treatment Patient Details Name: Gary [name] MRN: [id number] DOB: 01-Jan-1956 Today's Date: 10/20/2022   History of Present Illness 67 y.o. male presents to [facility] hospital on 10/16/2022 with reports of SOB and chest pain. Pt was in ED earlier this morning and elected to discharge home, however in route to home decided to return home for placement due to inadequate caregiver support. Of note, pt has had 4 admissions in the last 6 months, and 9 admissions in the last year. PMH includes obesity, hypoxia, respiratory failure with tracheostomy, COPD, GIB, HFpEF.    PT Comments  Pt received in supine and agreeable to session. Pt able to sit EOB with min guard with use of bed features and maintain seated balance with supervision. Pt requesting to return to supine to use the bedpan and required increased assist to elevate BLE to EOB. Pt able to assist with pericare in sidelying. Pt sitting back to EOB and requesting the bed be significantly elevated to stand to RW despite encouragement to attempt from lower surface for strengthening. Pt able to tolerate increased gait distance with SpO2 remaining >90% on 8L 40%. Pt requiring increased cues for safety throughout session due to decreased command following. Pt limited by decreased activity tolerance and DOE. Pt continues to benefit from PT services to progress toward functional mobility goals.     Assistance Recommended at Discharge Frequent or constant Supervision/Assistance  If plan is discharge home, recommend the following:  Can travel by private vehicle    A lot of help with walking and/or transfers;A lot of help with bathing/dressing/bathroom;Assistance with cooking/housework;Assist for transportation;Help with stairs or ramp for entrance   No  Equipment Recommendations  None recommended by PT    Recommendations for Other Services       Precautions / Restrictions Precautions Precautions: Fall Precaution Comments:  trach Restrictions Weight Bearing Restrictions: No RLE Weight Bearing: Weight bearing as tolerated     Mobility  Bed Mobility Overal bed mobility: Needs Assistance Bed Mobility: Supine to Sit, Sit to Supine     Supine to sit: Min guard, HOB elevated Sit to supine: Mod assist   General bed mobility comments: Use of bed features and mod A for BLE elevation to EOB.    Transfers Overall transfer level: Needs assistance Equipment used: Rolling walker (2 wheels) Transfers: Sit to/from Stand Sit to Stand: Min guard, From elevated surface, +2 safety/equipment           General transfer comment: Pt requesting the bed be significantly elevated and not allowing therapist to hold the back of the gait belt because "it will throw me off balance". Pt unwilling to attempt to stand from slightly lower surface.    Ambulation/Gait Ambulation/Gait assistance: Min guard, +2 safety/equipment Gait Distance (Feet): 25 Feet Assistive device: Rolling walker (2 wheels) Gait Pattern/deviations: Step-through pattern, Wide base of support, Trunk flexed Gait velocity: decreased     General Gait Details: Pt demonstrating a slow step-through pattern with no LOB. Requires a chair follow due to poor endurance and assist with lines.       Balance Overall balance assessment: Needs assistance Sitting-balance support: No upper extremity supported, Feet supported Sitting balance-[name] Scale: Fair Sitting balance - Comments: sitting EOB   Standing balance support: During functional activity, Bilateral upper extremity supported, Reliant on assistive device for balance Standing balance-[name] Scale: Poor Standing balance comment: with RW support  Cognition Arousal/Alertness: Awake/alert Behavior During Therapy: WFL for tasks assessed/performed, Agitated Overall Cognitive Status: Within Functional Limits for tasks assessed                                           Exercises      General Comments General comments (skin integrity, edema, etc.): Pt on 8L 40% throughout session with O2 at 90% post gait trial.      Pertinent Vitals/Pain Pain Assessment Pain Assessment: Faces Faces Pain Scale: Hurts even more Pain Location: back Pain Descriptors / Indicators: Aching, Guarding, Grimacing Pain Intervention(s): Monitored during session, Repositioned     PT Goals (current goals can now be found in the care plan section) Acute Rehab PT Goals Patient Stated Goal: get stronger and go home PT Goal Formulation: With patient Time For Goal Achievement: 10/31/22 Potential to Achieve Goals: Fair Progress towards PT goals: Progressing toward goals    Frequency    Min 3X/week      PT Plan Current plan remains appropriate       AM-PAC PT "6 Clicks" Mobility   Outcome Measure  Help needed turning from your back to your side while in a flat bed without using bedrails?: A Little Help needed moving from lying on your back to sitting on the side of a flat bed without using bedrails?: A Little Help needed moving to and from a bed to a chair (including a wheelchair)?: A Lot Help needed standing up from a chair using your arms (e.g., wheelchair or bedside chair)?: A Lot Help needed to walk in hospital room?: A Lot Help needed climbing 3-5 steps with a railing? : Total 6 Click Score: 13    End of Session Equipment Utilized During Treatment: Oxygen;Gait belt Activity Tolerance: Patient limited by fatigue Patient left: in chair;with call bell/phone within reach;with chair alarm set Nurse Communication: Mobility status;Need for lift equipment PT Visit Diagnosis: Muscle weakness (generalized) (M62.81);Difficulty in walking, not elsewhere classified (R26.2)     Time: 6295-2841 PT Time Calculation (min) (ACUTE ONLY): 54 min  Charges:    $Gait Training: 23-37 mins $Therapeutic Activity: 23-37 mins PT General Charges $$ ACUTE PT  VISIT: 1 Visit                     Johny Shock, PTA Acute Rehabilitation Services Secure Chat Preferred  [phone]    Johny Shock 10/20/2022, 3:05 PM

## 2022-10-20 NOTE — Progress Notes (Signed)
   10/20/22 0550  Oxygen Therapy/Pulse Ox  O2 Device Tracheostomy Collar  O2 Therapy Oxygen humidified  O2 Flow Rate (L/min) 10 L/min  FiO2 (%) 35 %  SpO2 94 %   Pt ready to come off Astral. Placed back on ATC 10L @ 35%.   Gary [name]

## 2022-10-20 NOTE — Significant Event (Signed)
Pt is Yelling Demanding A Nebulizer, Vitals stable, Wants New Treatment that the Pulmonologist ordered. Refusing Duo Nebs. MD, R/T currently at the Bedside.

## 2022-10-20 NOTE — Progress Notes (Signed)
PROGRESS NOTE    Gary [name]  ZOX:[id number] DOB: [birth year] DOA: 10/16/2022 PCP: Eloisa Northern, MD   Brief Narrative:  Gary [name] is a 67 y.o. male with medical history significant of diverticulosis, OSA, OHS, obesity, hypertension, hyperlipidemia, GERD, substance use, depression, anxiety, anemia, COPD, rare chronic respiratory failure with hypoxia, CKD 3A, diastolic CHF presenting with shortness of breath -found to be profoundly hypoxic in the ED from baseline.  Hospitalist called for admission.   Assessment & Plan:   Active Problems:   Major depressive disorder, recurrent episode, moderate ([name])   Acute on chronic diastolic CHF (congestive heart failure) ([name])   COPD (chronic obstructive pulmonary disease) ([name])   HTN (hypertension)   OSA (obstructive sleep apnea)   Chronic obstructive pulmonary disease (COPD) ([name])   Morbid obesity with BMI of 50.0-59.9, adult ([name])   GERD without esophagitis   Anxiety and depression   Iron deficiency anemia   Polysubstance abuse ([name])   Hyperlipidemia   Acute on chronic respiratory failure with hypoxia ([name])   Acute on chronic respiratory failure with hypoxia COPD, questionably in exacerbation Acute on chronic chronic diastolic CHF Obstructive sleep apnea, obesity hypoventilation syndrome, chronic -Patient chronically hypoxic via trach collar at 3L at home per patient although he indicates "I adjusted as I need to"  -Continue to wean oxygen as appropriate, remains hypoxic from baseline currently on 10L -Patient appears to be off any long-acting respiratory agents, unclear if patient has appropriate pulmonary outpatient follow-up given limited documentation -Pulmonology consulted for assistance with medications as well as to ensure safe and appropriate outpatient follow-up as per available documentation patient has not seen outpatient pulmonology in over a year. -Continue steroids, nebs, strict I's and O's -Recent echo shows EF was 65-70%,  normal diastolic function, normal RV function. -Continue home CPAP    Hypertension/hypotension -Borderline hypotensive, continue diuretics, midodrine  Hyperlipidemia -Would recommend statin, unclear if patient has intolerance, no allergies listed   Rule out acute versus chronic colitis/Diverticulitis -Chronic colitis versus segmental colitis associated with diverticulosis -Continue mesalamine   DVT History of GI bleed Iron deficiency anemia -Continues on Eliquis 2.5 mg twice daily -held earlier this year in March due to an acute GI bleed which has since resolved and not recurred -Chronic iron deficiency anemia, at baseline - Continue PPI   GERD - Continue home PPI   Obesity Body mass index is 49.16 kg/m.  DVT prophylaxis: Eliquis Code Status:   Code Status: Full Code Family Communication: None present  Status is: Inpatient  Dispo: The patient is from: Home              Anticipated d/c is to: LTAC              Anticipated d/c date is: 48 hours              Patient currently not medically stable for discharge  Consultants:  None  Procedures:  None  Antimicrobials:  None  Subjective: No acute issues or events overnight, patient denies nausea vomiting diarrhea constipation headache fevers chills or chest pain.  Shortness of breath appears to be ongoing but improving with treatment.  Able to ambulate short distances yesterday without major complication.  Objective: Vitals:   10/20/22 1003 10/20/22 1145 10/20/22 1516 10/20/22 1528  BP: 126/76  112/74   Pulse: 85     Resp: (!) 22  20   Temp: 98.2 F (36.8 C)  98.9 F (37.2 C)   TempSrc: Oral  Oral  SpO2: 93% 90%  90%  Weight:      Height:        Intake/Output Summary (Last 24 hours) at 10/20/2022 1557 Last data filed at 10/20/2022 1500 Gross per 24 hour  Intake 1440 ml  Output 2575 ml  Net -1135 ml    Filed Weights   10/18/22 0446 10/19/22 0539 10/20/22 0546  Weight: (!) 184.6 kg (!) 183.2 kg (!) 183.2  kg    Examination:  General: Somnolent but easily arousable, no acute distress. HEENT:  Normocephalic atraumatic.  Sclerae nonicteric, noninjected.  Extraocular movements intact bilaterally. Neck: Tracheostomy site clean dry intact. Lungs: Diminished bilaterally without overt wheeze or rhonchi. Heart:  Regular rate and rhythm.  Without murmurs, rubs, or gallops. Abdomen:  Soft, nontender, nondistended.  Without guarding or rebound. Extremities: Without cyanosis, clubbing, or obvious deformity.  1+ pitting edema bilateral lower extremity Skin:  Warm and dry, no erythema.  Data Reviewed: I have personally reviewed following labs and imaging studies  CBC: Recent Labs  Lab 10/16/22 2330 10/17/22 1326 10/18/22 0029 10/19/22 0041 10/20/22 0024  WBC 8.1 7.6 6.8 5.8 5.9  NEUTROABS 5.8  --   --   --   --   HGB 9.3* 9.5* 9.6* 9.7* 9.4*  HCT 35.5* 37.3* 37.4* 39.3 38.0*  MCV 80.5 82.7 82.9 83.8 83.0  PLT 145* 145* 131* 119* 113*    Basic Metabolic Panel: Recent Labs  Lab 10/16/22 2330 10/17/22 1326 10/18/22 0029 10/19/22 0041 10/20/22 0024  NA 141 140 139 142 140  K 4.8 4.1 4.1 4.8 4.0  CL 98 99 100 101 96*  CO2 31 33* 31 33* 37*  GLUCOSE 76 123* 121* 71 105*  BUN 31* 25* 29* 24* 26*  CREATININE 1.11 1.18 1.20 1.21 1.38*  CALCIUM 7.7* 7.5* 7.2* 7.6* 7.8*  MG  --   --  2.0  --   --     GFR: Estimated Creatinine Clearance: 94.7 mL/min (A) (by C-G formula based on SCr of 1.38 mg/dL (H)). Liver Function Tests: Recent Labs  Lab 10/18/22 0029  AST 13*  ALT 21  ALKPHOS 96  BILITOT 0.6  PROT 6.1*  ALBUMIN 2.8*     No results found for this or any previous visit (from the past 240 hour(s)).    Radiology Studies: No results found.  Scheduled Meds:  apixaban  2.5 mg Oral BID   docusate sodium  100 mg Oral Daily   ferrous sulfate  325 mg Oral Q breakfast   furosemide  80 mg Oral BID   mesalamine  2.4 g Oral BID   midodrine  10 mg Oral BID WC   pantoprazole  40  mg Oral QHS   predniSONE  10 mg Oral Q breakfast   sodium chloride flush  3 mL Intravenous Q12H   Continuous Infusions:   LOS: 3 days   Time spent:  Azucena Fallen, DO [facility]  If 7PM-7AM, please contact night-coverage [email or web address]  10/20/2022, 3:57 PM

## 2022-10-20 NOTE — Progress Notes (Signed)
   10/20/22 2302  BiPAP/CPAP/SIPAP  BiPAP/CPAP/SIPAP Pt Type Adult  Reason BIPAP/CPAP not in use Non-compliant   Patient said he does not want to wear tonight. He refused and was told to call the nurse if he changes his mind. Will continue to check and monitor him.
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
## 2022-10-21 DIAGNOSIS — I5033 Acute on chronic diastolic (congestive) heart failure: Secondary | ICD-10-CM | POA: Diagnosis not present

## 2022-10-21 DIAGNOSIS — J9621 Acute and chronic respiratory failure with hypoxia: Secondary | ICD-10-CM | POA: Diagnosis not present

## 2022-10-21 DIAGNOSIS — I10 Essential (primary) hypertension: Secondary | ICD-10-CM | POA: Diagnosis not present

## 2022-10-21 DIAGNOSIS — G4733 Obstructive sleep apnea (adult) (pediatric): Secondary | ICD-10-CM | POA: Diagnosis not present

## 2022-10-21 DIAGNOSIS — J441 Chronic obstructive pulmonary disease with (acute) exacerbation: Secondary | ICD-10-CM | POA: Diagnosis not present

## 2022-10-21 LAB — CBC
HCT: 38.6 % — ABNORMAL LOW (ref 39.0–52.0)
Hemoglobin: 9.8 g/dL — ABNORMAL LOW (ref 13.0–17.0)
MCH: 20.6 pg — ABNORMAL LOW (ref 26.0–34.0)
MCHC: 25.4 g/dL — ABNORMAL LOW (ref 30.0–36.0)
MCV: 81.3 fL (ref 80.0–100.0)
Platelets: 97 10*3/uL — ABNORMAL LOW (ref 150–400)
RBC: 4.75 MIL/uL (ref 4.22–5.81)
RDW: 18.6 % — ABNORMAL HIGH (ref 11.5–15.5)
WBC: 5.4 10*3/uL (ref 4.0–10.5)
nRBC: 0.7 % — ABNORMAL HIGH (ref 0.0–0.2)

## 2022-10-21 LAB — BASIC METABOLIC PANEL
Anion gap: 6 (ref 5–15)
BUN: 29 mg/dL — ABNORMAL HIGH (ref 8–23)
CO2: 36 mmol/L — ABNORMAL HIGH (ref 22–32)
Calcium: 8.1 mg/dL — ABNORMAL LOW (ref 8.9–10.3)
Chloride: 97 mmol/L — ABNORMAL LOW (ref 98–111)
Creatinine, Ser: 1.18 mg/dL (ref 0.61–1.24)
GFR, Estimated: >60 mL/min (ref >60)
Glucose, Bld: 139 mg/dL — ABNORMAL HIGH (ref 70–99)
Potassium: 4.2 mmol/L (ref 3.5–5.1)
Sodium: 139 mmol/L (ref 135–145)

## 2022-10-21 NOTE — Plan of Care (Signed)
  Problem: Education: Goal: Ability to demonstrate management of disease process will improve 10/21/2022 1455 by Arthor Captain, RN Outcome: Progressing

## 2022-10-21 NOTE — Consult Note (Signed)
NAME:  Gary [name], Gary [name]:  [id number], DOB:  Sep 05, 1955, LOS: 4 ADMISSION DATE:  10/16/2022, CONSULTATION DATE:  10/20/2022 REFERRING MD: Dr. 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  Found to be profoundly hypoxic on ED arrival prompting hospitalist consultation and admission for further workup.  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 rate (!) 34, height 6\' 4"  (1.93 m), weight (!) 183.2 kg, SpO2 96 %.    FiO2 (%):  [35 %] 35 %   Intake/Output Summary (Last 24 hours) at 10/21/2022 0904 Last data filed at 10/21/2022 0546 Gross per 24 hour  Intake 720 ml  Output 1950 ml  Net -1230 ml    Filed Weights   10/18/22 0446 10/19/22 0539 10/20/22 0546  Weight: (!) 184.6 kg (!) 183.2 kg (!) 183.2 kg    Examination:  General: morbidly obese male, no acute distress HEENT: 7 cuffed trach  midline, MM pink/moist, PERRL Neuro: Alert and oriented x 3, nonfocal CV: s1s2 regular rate and rhythm, no murmur, rubs, or gallops,  PULM: Diminished bilaterally, PMV valve in place GI: soft, bowel sounds active in all 4 quadrants, non-tender, non-distended Extremities: warm/dry, 1+ pitting lower extremity edema  Skin: no rashes or lesions  Resolved Hospital Problem list     Assessment & Plan:  Acute on chronic hypoxic respiratory failure -Currently on 3 L via ATC at baseline, per chart review it appears tracheostomy was placed 10/03/2021 due to failed SBT in setting of acute hypoxic respiratory failure.  It appears patient was hospitalized from 09/19/21 to 12/03/21 at this hospitalization -Patient has multiple admissions and ED visits for respiratory complaints/trach issues, it does not appear that he follows up with outpatient pulmonology currently. S/p chronic tracheostomy History of OSA/OHS HFpEF -Volume overloaded on exam  P:  Continue supplemental oxygen for stat goal > 90 Continue NIV therapy at at bedtime Head of bed elevated 30 degrees. Follow intermittent chest x-ray and ABG Routine trach care  Ensure adequate pulmonary hygiene  Follow cultures  Monitor off antibiotics, no clinical evidence of infection currently Continue to diurese as able Continue budesonide, brovana and yupelri Removal of PMV valve at night and during rest Appears patient was discharged from most recently 10/08/22 on steroid taper and remains on low-dose prednisone currently Will need outpatient pulmonary follow-up  Best Practice (right click and "Reselect all SmartList Selections" daily)  Per primary   Critical care time: NA   Melody Comas, MD [facility] Office: [phone]  See Amion for personal pager PCCM on call pager [phone] until 7pm. Please call Elink 7p-7a. [phone]

## 2022-10-21 NOTE — Progress Notes (Signed)
PROGRESS NOTE    Gary [name]  ZOX:[id number] DOB: 1956-03-24 DOA: 10/16/2022 PCP: Eloisa Northern, MD   Brief Narrative:  Gary [name] is a 67 y.o. male with medical history significant of diverticulosis, OSA, OHS, obesity, hypertension, hyperlipidemia, GERD, substance use, depression, anxiety, anemia, COPD, rare chronic respiratory failure with hypoxia, CKD 3A, diastolic CHF presenting with shortness of breath -found to be profoundly hypoxic in the ED from baseline.  Hospitalist called for admission.   Assessment & Plan:   Active Problems:   Major depressive disorder, recurrent episode, moderate ([name])   Acute on chronic diastolic CHF (congestive heart failure) ([name])   COPD (chronic obstructive pulmonary disease) ([name])   HTN (hypertension)   OSA (obstructive sleep apnea)   Chronic obstructive pulmonary disease (COPD) ([name])   Morbid obesity with BMI of 50.0-59.9, adult ([name])   GERD without esophagitis   Anxiety and depression   Iron deficiency anemia   Polysubstance abuse ([name])   Hyperlipidemia   Acute on chronic respiratory failure with hypoxia ([name])  Acute on chronic respiratory failure with hypoxia COPD, questionably in exacerbation Acute on chronic chronic diastolic CHF Obstructive sleep apnea, obesity hypoventilation syndrome, chronic - Patient chronically hypoxic via trach collar at 3L at home per patient although he indicates "I adjusted as I need to"  - Continue to wean oxygen as appropriate, remains hypoxic from baseline - continues to require 10L - Patient appears to be off any long-acting respiratory agents, unclear if patient has appropriate pulmonary outpatient follow-up given limited documentation - Pulmonology consulted for assistance with medications as well as to ensure safe and appropriate outpatient follow-up  -Continue yupelri, pulmicort, brovana nebs per pulm - Continue steroids, nebs, strict I's and O's - Recent echo shows EF was 65-70%, normal diastolic  function, normal RV function. - Continue home CPAP HS   Hypertension/hypotension -Borderline hypotensive, continue diuretics, midodrine  Hyperlipidemia -Would recommend statin, unclear if patient has intolerance, no allergies listed   Rule out acute versus chronic colitis/Diverticulitis -Chronic colitis versus segmental colitis associated with diverticulosis -Continue mesalamine   DVT History of GI bleed Iron deficiency anemia -Continues on Eliquis 2.5 mg twice daily -held earlier this year in March due to an acute GI bleed which has since resolved and not recurred -Chronic iron deficiency anemia, at baseline - Continue PPI   GERD - Continue home PPI   Obesity Body mass index is 49.16 kg/m.  DVT prophylaxis: Eliquis Code Status:   Code Status: Full Code Family Communication: None present  Status is: Inpatient  Dispo: The patient is from: Home              Anticipated d/c is to: LTAC              Anticipated d/c date is: 48 hours              Patient currently not medically stable for discharge  Consultants:  None  Procedures:  None  Antimicrobials:  None  Subjective: No acute issues or events overnight, patient denies nausea vomiting diarrhea constipation headache fevers chills or chest pain.  Shortness of breath appears to be ongoing but improving with treatment.  Able to ambulate short distances yesterday without major complication.  Objective: Vitals:   10/21/22 0011 10/21/22 0013 10/21/22 0326 10/21/22 0539  BP: 117/65   (!) 133/96  Pulse: 87 87  84  Resp: 20 16  (!) 34  Temp: 97.7 F (36.5 C)   97.7 F (36.5 C)  TempSrc: Oral  Oral  SpO2: (!) 88% 90% 92% 96%  Weight:      Height:        Intake/Output Summary (Last 24 hours) at 10/21/2022 0724 Last data filed at 10/21/2022 0546 Gross per 24 hour  Intake 960 ml  Output 1950 ml  Net -990 ml    Filed Weights   10/18/22 0446 10/19/22 0539 10/20/22 0546  Weight: (!) 184.6 kg (!) 183.2 kg (!)  183.2 kg    Examination:  General: Somnolent but easily arousable, no acute distress. HEENT:  Normocephalic atraumatic.  Sclerae nonicteric, noninjected.  Extraocular movements intact bilaterally. Neck: Tracheostomy site clean dry intact. Lungs: Diminished bilaterally without overt wheeze or rhonchi. Heart:  Regular rate and rhythm.  Without murmurs, rubs, or gallops. Abdomen:  Soft, nontender, nondistended.  Without guarding or rebound. Extremities: Without cyanosis, clubbing, or obvious deformity.  1+ pitting edema bilateral lower extremity Skin:  Warm and dry, no erythema.  Data Reviewed: I have personally reviewed following labs and imaging studies  CBC: Recent Labs  Lab 10/16/22 2330 10/17/22 1326 10/18/22 0029 10/19/22 0041 10/20/22 0024 10/21/22 0038  WBC 8.1 7.6 6.8 5.8 5.9 5.4  NEUTROABS 5.8  --   --   --   --   --   HGB 9.3* 9.5* 9.6* 9.7* 9.4* 9.8*  HCT 35.5* 37.3* 37.4* 39.3 38.0* 38.6*  MCV 80.5 82.7 82.9 83.8 83.0 81.3  PLT 145* 145* 131* 119* 113* 97*    Basic Metabolic Panel: Recent Labs  Lab 10/17/22 1326 10/18/22 0029 10/19/22 0041 10/20/22 0024 10/21/22 0038  NA 140 139 142 140 139  K 4.1 4.1 4.8 4.0 4.2  CL 99 100 101 96* 97*  CO2 33* 31 33* 37* 36*  GLUCOSE 123* 121* 71 105* 139*  BUN 25* 29* 24* 26* 29*  CREATININE 1.18 1.20 1.21 1.38* 1.18  CALCIUM 7.5* 7.2* 7.6* 7.8* 8.1*  MG  --  2.0  --   --   --     GFR: Estimated Creatinine Clearance: 110.7 mL/min (by C-G formula based on SCr of 1.18 mg/dL). Liver Function Tests: Recent Labs  Lab 10/18/22 0029  AST 13*  ALT 21  ALKPHOS 96  BILITOT 0.6  PROT 6.1*  ALBUMIN 2.8*     No results found for this or any previous visit (from the past 240 hour(s)).    Radiology Studies: No results found.  Scheduled Meds:  apixaban  2.5 mg Oral BID   arformoterol  15 mcg Nebulization BID   budesonide (PULMICORT) nebulizer solution  0.5 mg Nebulization BID   docusate sodium  100 mg Oral  Daily   ferrous sulfate  325 mg Oral Q breakfast   furosemide  80 mg Oral BID   mesalamine  2.4 g Oral BID   midodrine  10 mg Oral BID WC   pantoprazole  40 mg Oral QHS   predniSONE  10 mg Oral Q breakfast   revefenacin  175 mcg Nebulization Daily   sodium chloride flush  3 mL Intravenous Q12H   Continuous Infusions:   LOS: 4 days   Time spent:  Azucena Fallen, DO [facility]  If 7PM-7AM, please contact night-coverage [email or web address]  10/21/2022, 7:24 AM

## 2022-10-22 DIAGNOSIS — J9621 Acute and chronic respiratory failure with hypoxia: Secondary | ICD-10-CM | POA: Diagnosis not present

## 2022-10-22 LAB — CBC
HCT: 38 % — ABNORMAL LOW (ref 39.0–52.0)
Hemoglobin: 9.7 g/dL — ABNORMAL LOW (ref 13.0–17.0)
MCH: 21.6 pg — ABNORMAL LOW (ref 26.0–34.0)
MCHC: 25.5 g/dL — ABNORMAL LOW (ref 30.0–36.0)
MCV: 84.4 fL (ref 80.0–100.0)
Platelets: 105 10*3/uL — ABNORMAL LOW (ref 150–400)
RBC: 4.5 MIL/uL (ref 4.22–5.81)
RDW: 18.6 % — ABNORMAL HIGH (ref 11.5–15.5)
WBC: 4.6 10*3/uL (ref 4.0–10.5)
nRBC: 0.7 % — ABNORMAL HIGH (ref 0.0–0.2)

## 2022-10-22 LAB — BASIC METABOLIC PANEL
Anion gap: 7 (ref 5–15)
BUN: 26 mg/dL — ABNORMAL HIGH (ref 8–23)
CO2: 37 mmol/L — ABNORMAL HIGH (ref 22–32)
Calcium: 8.3 mg/dL — ABNORMAL LOW (ref 8.9–10.3)
Chloride: 97 mmol/L — ABNORMAL LOW (ref 98–111)
Creatinine, Ser: 1.4 mg/dL — ABNORMAL HIGH (ref 0.61–1.24)
GFR, Estimated: 56 mL/min — ABNORMAL LOW (ref >60)
Glucose, Bld: 125 mg/dL — ABNORMAL HIGH (ref 70–99)
Potassium: 4.5 mmol/L (ref 3.5–5.1)
Sodium: 141 mmol/L (ref 135–145)

## 2022-10-22 MED ORDER — MELATONIN 3 MG PO TABS
3.0000 mg | ORAL_TABLET | Freq: Every day | ORAL | Status: DC
  Administered 2022-10-22 – 2022-11-11 (×21): 3 mg via ORAL
  Filled 2022-10-22 (×21): qty 1

## 2022-10-22 NOTE — Care Management Important Message (Signed)
Important Message  Patient Details  Name: Gary [name] MRN: [id number] Date of Birth: Sep 04, 1955   Medicare Important Message Given:  Yes     [name] 10/22/2022, 8:23 AM

## 2022-10-22 NOTE — Progress Notes (Addendum)
NAME:  Gary [name], MRN:  [id number], DOB:  04-13-1956, LOS: 5 ADMISSION DATE:  10/16/2022, CONSULTATION DATE:  10/20/2022 REFERRING MD: Dr. 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  Found to be profoundly hypoxic on ED arrival prompting hospitalist consultation and admission for further workup.  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 rate (Abnormal) 25, height 6\' 4"  (1.93 m), weight (Abnormal) 188.5 kg, SpO2 93 %.    FiO2 (%):  [28 %-35 %] 28 %   Intake/Output Summary (Last 24 hours) at 10/22/2022 1402 Last data filed at 10/22/2022 0936 Gross per 24 hour  Intake 1000 ml  Output 2000 ml  Net -1000 ml   Filed Weights   10/19/22 0539 10/20/22 0546 10/22/22 0417  Weight: (Abnormal) 183.2 kg (Abnormal) 183.2 kg (Abnormal) 188.5 kg    Examination: General this is a 67 year old chronically ill male. He is sleepy but once awake engaged in  conversation  HENT # 7 prox XLT trach is mid line. Good phonation quality w/ PMV in place.  Pulm dec'd through out Card rrr Ext dependent edema Neuro intact   Resolved Hospital Problem list     Assessment & Plan:     Acute on chronic hypoxic resp failure 2/2 volume overload and acute on chronic HFpEF secondary to decompensated OHS/OSA and cor pulmonale.  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  Nocturnal ventilator dependence  Now down 9.1 liters. I worry he is not getting full therapeutic effect from positive pressure ventilation w cuffless trach and that is why he has had such frequent re-occurrence of symptoms. Some times we can get away w/ cuffless but often need to make vent adjustments to compensate for the airleak.    Plan Cont wean FIo2 Cont nocturnal vent AND  encouraged him to use it at rest (he uses autoset NIPPV)  Need to get am ABG after vent removed. Cont # 7 xlt prox shiley will keep this for now. In past he has NOT tolerated cuffed #7 trach. Sounds like he was air trapping w/ PMV in place. He's not open to going back to #7 cuffed trach so should we need to offer ventilation via CUFFED trach might be best to get him a # 6 prox XLT (w/ hope that smaller lumen trach will allow him to better tolerate PMV when cuff down but still be large enough to facilitate ventilation). Cont lasix  Cont BDs No need to pulse steroids  40 minutes spent reviewing records interviewing and examining patient  in addition to developing plan  Simonne Martinet ACNP-BC [facility] Pulmonary/Critical Care Pager # [phone] OR # [phone] if no answer     Best Practice (right click and "Reselect all SmartList Selections" daily)  Per primary

## 2022-10-22 NOTE — Progress Notes (Signed)
PROGRESS NOTE    Gary [name]  ZOX:[id number] DOB: March 08, 1956 DOA: 10/16/2022 PCP: Eloisa Northern, MD   Brief Narrative:  Gary [name] is a 67 y.o. male with medical history significant of diverticulosis, OSA, OHS, obesity, hypertension, hyperlipidemia, GERD, substance use, depression, anxiety, anemia, COPD, rare chronic respiratory failure with hypoxia, CKD 3A, diastolic CHF presenting with shortness of breath -found to be profoundly hypoxic in the ED from baseline.  Hospitalist called for admission.   Assessment & Plan:   Active Problems:   Major depressive disorder, recurrent episode, moderate ([name])   Acute on chronic diastolic CHF (congestive heart failure) ([name])   COPD (chronic obstructive pulmonary disease) ([name])   HTN (hypertension)   OSA (obstructive sleep apnea)   Chronic obstructive pulmonary disease (COPD) ([name])   Morbid obesity with BMI of 50.0-59.9, adult ([name])   GERD without esophagitis   Anxiety and depression   Iron deficiency anemia   Polysubstance abuse ([name])   Hyperlipidemia   Acute on chronic respiratory failure with hypoxia ([name])  Acute on chronic respiratory failure with hypoxia COPD, questionably in exacerbation Acute on chronic chronic diastolic CHF Obstructive sleep apnea, obesity hypoventilation syndrome, chronic - Patient chronically hypoxic via trach collar at 3L at home per patient although he indicates "I adjusted as I need to" with no clear specificity - Continue to wean oxygen as appropriate, remains hypoxic from baseline - Oxygen downtrending: SpO2: 93 % O2 Flow Rate (L/min): 6 L/min FiO2 (%): 28 % -Pulmonology evaluating patient, appreciate insight recommendations, initiate Yupelri, Pulmicort, Brovana.  Respiratory symptoms improving -Steroids, nebs ongoing -Echo shows EF 65 to 70% normal diastolic function, normal right ventricle function -Continue CPAP as needed and at bedtime   Hypertension/hypotension -Borderline hypotensive, continue  diuretics, midodrine  Hyperlipidemia -Would recommend statin, unclear if patient has intolerance, no allergies listed   Chronic colitis/Diverticulitis without acute flare -Chronic colitis versus segmental colitis associated with diverticulosis -Continue mesalamine   DVT History of GI bleed Iron deficiency anemia -Continues on Eliquis 2.5 mg twice daily -held earlier this year in March due to an acute GI bleed which has since resolved and not recurred -Chronic iron deficiency anemia, at baseline - Continue PPI   GERD - Continue home PPI   Obesity Body mass index is 50.58 kg/m.  DVT prophylaxis: Eliquis Code Status:   Code Status: Full Code Family Communication: None present  Status is: Inpatient  Dispo: The patient is from: Home              Anticipated d/c is to: LTAC              Anticipated d/c date is: 48 hours              Patient currently not medically stable for discharge  Consultants:  None  Procedures:  None  Antimicrobials:  None  Subjective: No acute issues or events overnight, patient denies nausea vomiting diarrhea constipation headache fevers chills or chest pain.  Shortness of breath improving drastically except with exertion.  Patient currently refusing discharge, despite plan over the past week has been to discharge to [facility].  Objective: Vitals:   10/21/22 2330 10/21/22 2354 10/22/22 0000 10/22/22 0417  BP:  114/62 114/62 106/60  Pulse: 85 85 85 84  Resp: 16 18  17   Temp:  98 F (36.7 C)  98.2 F (36.8 C)  TempSrc:  Oral  Axillary  SpO2: 94% 100% 100% 91%  Weight:    (!) 188.5 kg  Height:  Intake/Output Summary (Last 24 hours) at 10/22/2022 0658 Last data filed at 10/22/2022 0150 Gross per 24 hour  Intake 1080 ml  Output 2100 ml  Net -1020 ml    Filed Weights   10/19/22 0539 10/20/22 0546 10/22/22 0417  Weight: (!) 183.2 kg (!) 183.2 kg (!) 188.5 kg    Examination:  General: Somnolent but easily arousable, no acute  distress. HEENT:  Normocephalic atraumatic.  Sclerae nonicteric, noninjected.  Extraocular movements intact bilaterally. Neck: Tracheostomy site clean dry intact. Lungs: Diminished bilaterally without overt wheeze or rhonchi. Heart:  Regular rate and rhythm.  Without murmurs, rubs, or gallops. Abdomen:  Soft, nontender, nondistended.  Without guarding or rebound. Extremities: Without cyanosis, clubbing, or obvious deformity.  1+ pitting edema bilateral lower extremity Skin:  Warm and dry, no erythema.  Data Reviewed: I have personally reviewed following labs and imaging studies  CBC: Recent Labs  Lab 10/16/22 2330 10/17/22 1326 10/18/22 0029 10/19/22 0041 10/20/22 0024 10/21/22 0038 10/22/22 0050  WBC 8.1   < > 6.8 5.8 5.9 5.4 4.6  NEUTROABS 5.8  --   --   --   --   --   --   HGB 9.3*   < > 9.6* 9.7* 9.4* 9.8* 9.7*  HCT 35.5*   < > 37.4* 39.3 38.0* 38.6* 38.0*  MCV 80.5   < > 82.9 83.8 83.0 81.3 84.4  PLT 145*   < > 131* 119* 113* 97* 105*   < > = values in this interval not displayed.    Basic Metabolic Panel: Recent Labs  Lab 10/18/22 0029 10/19/22 0041 10/20/22 0024 10/21/22 0038 10/22/22 0050  NA 139 142 140 139 141  K 4.1 4.8 4.0 4.2 4.5  CL 100 101 96* 97* 97*  CO2 31 33* 37* 36* 37*  GLUCOSE 121* 71 105* 139* 125*  BUN 29* 24* 26* 29* 26*  CREATININE 1.20 1.21 1.38* 1.18 1.40*  CALCIUM 7.2* 7.6* 7.8* 8.1* 8.3*  MG 2.0  --   --   --   --     GFR: Estimated Creatinine Clearance: 94.9 mL/min (A) (by C-G formula based on SCr of 1.4 mg/dL (H)). Liver Function Tests: Recent Labs  Lab 10/18/22 0029  AST 13*  ALT 21  ALKPHOS 96  BILITOT 0.6  PROT 6.1*  ALBUMIN 2.8*     No results found for this or any previous visit (from the past 240 hour(s)).    Radiology Studies: No results found.  Scheduled Meds:  apixaban  2.5 mg Oral BID   arformoterol  15 mcg Nebulization BID   budesonide (PULMICORT) nebulizer solution  0.5 mg Nebulization BID   docusate  sodium  100 mg Oral Daily   ferrous sulfate  325 mg Oral Q breakfast   furosemide  80 mg Oral BID   mesalamine  2.4 g Oral BID   midodrine  10 mg Oral BID WC   pantoprazole  40 mg Oral QHS   predniSONE  10 mg Oral Q breakfast   revefenacin  175 mcg Nebulization Daily   sodium chloride flush  3 mL Intravenous Q12H   Continuous Infusions:   LOS: 5 days   Time spent:  Azucena Fallen, DO [facility]  If 7PM-7AM, please contact night-coverage [email or web address]  10/22/2022, 6:58 AM

## 2022-10-22 NOTE — Progress Notes (Signed)
   10/22/22 2304  BiPAP/CPAP/SIPAP  BiPAP/CPAP/SIPAP Pt Type Adult  BiPAP/CPAP/SIPAP Resmed (Pt's home astral resmed)  IPAP  (home settings)  Patient Home Equipment Yes  Safety Check Completed by RT for Home Unit Yes, no issues noted   Pt resting comfortably on his home unit.  RT wil lcontinue to monitor and draw ABG as per order when pt comes off in the morning

## 2022-10-22 NOTE — TOC Progression Note (Signed)
Transition of Care [facility]) - Initial/Assessment Note    Patient Details  Name: Gary [name] MRN: [id number] Date of Birth: Jun 09, 1955  [facility] CM/SW Contact:    Ralene Bathe, LCSW Phone Number: 10/22/2022, 3:41 PM  Clinical Narrative:                 LCSW spoke with [name] at [facility] to receive more information about the peer to peer.  Information given to MD.  TOC following.    Expected Discharge Plan: Skilled Nursing Facility Barriers to Discharge: Continued Medical Work up   Patient Goals and CMS Choice Patient states their goals for this hospitalization and ongoing recovery are:: [facility] to [facility]          Expected Discharge Plan and Services       Living arrangements for the past 2 months: Single Family Home                           HH Arranged: PT, RN (Resumption of care) HH Agency: [facility] Date [facility] Agency Contacted: 10/19/22 Time HH Agency Contacted: 1320 Representative spoke with at [facility] Agency: [name]  Prior Living Arrangements/Services Living arrangements for the past 2 months: Single Family Home Lives with:: Significant Other Patient language and need for interpreter reviewed:: Yes Do you feel safe going back to the place where you live?: Yes      Need for Family Participation in Patient Care: Yes (Comment) Care giver support system in place?: Yes (comment) Current home services: DME Criminal Activity/Legal Involvement Pertinent to Current Situation/Hospitalization: No - Comment as needed  Activities of Daily Living Home Assistive Devices/Equipment: Vent/Trach supplies ADL Screening (condition at time of admission) Patient's cognitive ability adequate to safely complete daily activities?: Yes Is the patient deaf or have difficulty hearing?: No Does the patient have difficulty seeing, even when wearing glasses/contacts?: No Does the patient have difficulty concentrating, remembering, or making decisions?: No Patient  able to express need for assistance with ADLs?: Yes Does the patient have difficulty dressing or bathing?: No Independently performs ADLs?: No Communication: Independent Does the patient have difficulty walking or climbing stairs?: Yes Weakness of Legs: Both Weakness of Arms/Hands: None  Permission Sought/Granted Permission sought to share information with : Case Manager Permission granted to share information with : Yes, Verbal Permission Granted              Emotional Assessment Appearance:: Developmentally appropriate Attitude/Demeanor/Rapport: Engaged Affect (typically observed): Calm Orientation: : Oriented to Self, Oriented to Place, Oriented to  Time, Oriented to Situation Alcohol / Substance Use: Not Applicable Psych Involvement: No (comment)  Admission diagnosis:  Acute on chronic respiratory failure with hypoxia ([name]) [J96.21] Chronic shortness of breath [R06.02] Patient Active Problem List   Diagnosis Date Noted   Acute on chronic respiratory failure with hypoxia ([name]) 10/17/2022   Chronic colitis 09/12/2022   Segmental colitis associated with diverticulosis ([name]) 09/12/2022   Diverticulosis of colon with hemorrhage 09/10/2022   Lower GI bleed 09/07/2022   Influenza A 04/11/2022   Hypokalemia 03/06/2022   Chronic obstructive pulmonary disease (COPD) ([name]) 02/03/2022   GERD without esophagitis 02/03/2022   Anxiety and depression 02/03/2022   Hematochezia    Major depressive disorder, recurrent episode, moderate ([name]) 10/22/2021   Pressure injury of skin 09/27/2021   COPD (chronic obstructive pulmonary disease) ([name])    Iron deficiency anemia    Chronic kidney disease, stage 3a ([name]) 07/22/2021  Weakness    (HFpEF) heart failure with preserved ejection fraction ([name])    Chronic respiratory failure with hypoxia ([name])    Hyperlipidemia 02/19/2021   Generalized osteoarthritis of multiple sites 10/31/2020   Hyperkalemia 03/04/2020   Marijuana use 01/17/2020    Thrombocytopenia ([name]) 01/17/2020   Positive hepatitis C antibody test 01/17/2020   Osteoarthritis of glenohumeral joints (Bilateral) 01/17/2020   Osteoarthritis of AC (acromioclavicular) joints (Bilateral) 01/17/2020   Polysubstance abuse ([name]) 08/25/2019   Toxic metabolic encephalopathy 08/25/2019   Chronic pain syndrome 07/14/2019   Anticoagulated 07/14/2019   DDD (degenerative disc disease), lumbosacral 07/14/2019   DDD (degenerative disc disease), cervical 07/14/2019   Acute on chronic diastolic CHF (congestive heart failure) ([name]) 02/28/2015   OSA (obstructive sleep apnea) 02/28/2015   Morbid obesity with BMI of 50.0-59.9, adult ([name]) 04/01/2014   HTN (hypertension) 04/01/2014   PCP:  Eloisa Northern, MD Pharmacy:   [facility] - [place], [place] - [address] [place] [address] Phone: [phone] Fax: [phone]     Social Determinants of Health (SDOH) Social History: SDOH Screenings   Food Insecurity: No Food Insecurity (10/18/2022)  Housing: Patient Declined (10/18/2022)  Transportation Needs: No Transportation Needs (10/18/2022)  Recent Concern: Transportation Needs - Unmet Transportation Needs (08/13/2022)  Utilities: Not At Risk (10/18/2022)  Depression (PHQ2-9): Low Risk  (06/07/2021)  Financial Resource Strain: Medium Risk (05/03/2017)  Physical Activity: Insufficiently Active (05/03/2017)  Social Connections: Moderately Integrated (05/03/2017)  Stress: No Stress Concern Present (05/03/2017)  Tobacco Use: Medium Risk (10/17/2022)   SDOH Interventions:     Readmission Risk Interventions    12/31/2021    8:58 AM 12/20/2021    4:21 PM 05/04/2021    1:26 PM  Readmission Risk Prevention Plan  Transportation Screening Complete Complete Complete  Medication Review Oceanographer) Complete Complete Complete  PCP or Specialist appointment within 3-5 days of discharge Complete Complete Complete  HRI or Home Care Consult Complete Complete Complete   SW Recovery Care/Counseling Consult Complete Complete Complete  Palliative Care Screening Complete Complete Not Applicable  Skilled Nursing Facility Not Applicable Not Applicable Not Applicable

## 2022-10-22 NOTE — TOC Progression Note (Signed)
Transition of Care [facility]) - Progression Note    Patient Details  Name: Gary [name] MRN: [id number] Date of Birth: [birth year]  Transition of Care [facility]) CM/SW Contact  Ronny Bacon, RN Phone Number: 10/22/2022, 2:51 PM  Clinical Narrative:  Patient is due for Peer to Peer review by 9 am tomorrow, [phone], option # 5. Dr Natale Milch, [name] with [facility], and CSW made aware.    Expected Discharge Plan: Skilled Nursing Facility Barriers to Discharge: Continued Medical Work up  Expected Discharge Plan and Services       Living arrangements for the past 2 months: Single Family Home                           HH Arranged: PT, RN (Resumption of care) HH Agency: [facility] Date [name] Agency Contacted: 10/19/22 Time HH Agency Contacted: 1320 Representative spoke with at [facility] Agency: [name]   Social Determinants of Health (SDOH) Interventions SDOH Screenings   Food Insecurity: No Food Insecurity (10/18/2022)  Housing: Patient Declined (10/18/2022)  Transportation Needs: No Transportation Needs (10/18/2022)  Recent Concern: Transportation Needs - Unmet Transportation Needs (08/13/2022)  Utilities: Not At Risk (10/18/2022)  Depression (PHQ2-9): Low Risk  (06/07/2021)  Financial Resource Strain: Medium Risk (05/03/2017)  Physical Activity: Insufficiently Active (05/03/2017)  Social Connections: Moderately Integrated (05/03/2017)  Stress: No Stress Concern Present (05/03/2017)  Tobacco Use: Medium Risk (10/17/2022)    Readmission Risk Interventions    12/31/2021    8:58 AM 12/20/2021    4:21 PM 05/04/2021    1:26 PM  Readmission Risk Prevention Plan  Transportation Screening Complete Complete Complete  Medication Review Oceanographer) Complete Complete Complete  PCP or Specialist appointment within 3-5 days of discharge Complete Complete Complete  HRI or Home Care Consult Complete Complete Complete  SW Recovery Care/Counseling Consult Complete Complete Complete   Palliative Care Screening Complete Complete Not Applicable  Skilled Nursing Facility Not Applicable Not Applicable Not Applicable

## 2022-10-23 DIAGNOSIS — F1910 Other psychoactive substance abuse, uncomplicated: Secondary | ICD-10-CM | POA: Diagnosis not present

## 2022-10-23 DIAGNOSIS — F331 Major depressive disorder, recurrent, moderate: Secondary | ICD-10-CM

## 2022-10-23 DIAGNOSIS — E785 Hyperlipidemia, unspecified: Secondary | ICD-10-CM | POA: Diagnosis not present

## 2022-10-23 DIAGNOSIS — I5033 Acute on chronic diastolic (congestive) heart failure: Secondary | ICD-10-CM | POA: Diagnosis not present

## 2022-10-23 DIAGNOSIS — J9621 Acute and chronic respiratory failure with hypoxia: Secondary | ICD-10-CM | POA: Diagnosis not present

## 2022-10-23 LAB — BLOOD GAS, ARTERIAL
Acid-Base Excess: 11 mmol/L — ABNORMAL HIGH (ref 0.0–2.0)
Bicarbonate: 41.4 mmol/L — ABNORMAL HIGH (ref 20.0–28.0)
O2 Saturation: 86.5 %
Patient temperature: 37
pCO2 arterial: 86 mmHg (ref 32–48)
pH, Arterial: 7.29 — ABNORMAL LOW (ref 7.35–7.45)
pO2, Arterial: 54 mmHg — ABNORMAL LOW (ref 83–108)

## 2022-10-23 LAB — BASIC METABOLIC PANEL
Anion gap: 7 (ref 5–15)
BUN: 29 mg/dL — ABNORMAL HIGH (ref 8–23)
CO2: 36 mmol/L — ABNORMAL HIGH (ref 22–32)
Calcium: 8.2 mg/dL — ABNORMAL LOW (ref 8.9–10.3)
Chloride: 95 mmol/L — ABNORMAL LOW (ref 98–111)
Creatinine, Ser: 1.53 mg/dL — ABNORMAL HIGH (ref 0.61–1.24)
GFR, Estimated: 50 mL/min — ABNORMAL LOW (ref >60)
Glucose, Bld: 120 mg/dL — ABNORMAL HIGH (ref 70–99)
Potassium: 4.4 mmol/L (ref 3.5–5.1)
Sodium: 138 mmol/L (ref 135–145)

## 2022-10-23 LAB — CBC
HCT: 38.1 % — ABNORMAL LOW (ref 39.0–52.0)
Hemoglobin: 9.5 g/dL — ABNORMAL LOW (ref 13.0–17.0)
MCH: 20.8 pg — ABNORMAL LOW (ref 26.0–34.0)
MCHC: 24.9 g/dL — ABNORMAL LOW (ref 30.0–36.0)
MCV: 83.6 fL (ref 80.0–100.0)
Platelets: 95 10*3/uL — ABNORMAL LOW (ref 150–400)
RBC: 4.56 MIL/uL (ref 4.22–5.81)
RDW: 18.6 % — ABNORMAL HIGH (ref 11.5–15.5)
WBC: 4.7 10*3/uL (ref 4.0–10.5)
nRBC: 0.6 % — ABNORMAL HIGH (ref 0.0–0.2)

## 2022-10-23 MED ORDER — FUROSEMIDE 40 MG PO TABS
40.0000 mg | ORAL_TABLET | Freq: Two times a day (BID) | ORAL | Status: DC
  Administered 2022-10-23 – 2022-10-26 (×6): 40 mg via ORAL
  Filled 2022-10-23 (×6): qty 1

## 2022-10-23 NOTE — Plan of Care (Signed)
  Problem: Education: Goal: Ability to demonstrate management of disease process will improve Outcome: Progressing Goal: Ability to verbalize understanding of medication therapies will improve Outcome: Progressing Goal: Individualized Educational Video(s) Outcome: Progressing   Problem: Activity: Goal: Capacity to carry out activities will improve Outcome: Progressing   Problem: Cardiac: Goal: Ability to achieve and maintain adequate cardiopulmonary perfusion will improve Outcome: Progressing   Problem: Education: Goal: Knowledge of General Education information will improve Description: Including pain rating scale, medication(s)/side effects and non-pharmacologic comfort measures Outcome: Progressing   Problem: Health Behavior/Discharge Planning: Goal: Ability to manage health-related needs will improve Outcome: Progressing   Problem: Clinical Measurements: Goal: Ability to maintain clinical measurements within normal limits will improve Outcome: Progressing Goal: Will remain free from infection Outcome: Progressing   

## 2022-10-23 NOTE — Progress Notes (Signed)
   10/23/22 2304  BiPAP/CPAP/SIPAP  $ Non-Invasive Home Ventilator  Subsequent  BiPAP/CPAP/SIPAP Pt Type Adult  BiPAP/CPAP/SIPAP Resmed  Flow Rate 10 lpm  Patient Home Equipment Yes  Auto Titrate No  BiPAP/CPAP /SiPAP Vitals  SpO2 94 %   Pt resting comfortably on his hime Bipap.  RT will continue to monitor

## 2022-10-23 NOTE — Progress Notes (Signed)
   NAME:  Gary [name], MRN:  [id number], DOB:  04/16/1956, LOS: 6 ADMISSION DATE:  10/16/2022, CONSULTATION DATE:  10/20/2022 REFERRING MD: Dr. 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  Found to be profoundly hypoxic on ED arrival prompting hospitalist consultation and admission for further workup.  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 rate (!) 28, height 6\' 4"  (1.93 m), weight (!) 187.7 kg, SpO2 90 %.    FiO2 (%):  [28 %-35 %] 28 %   Intake/Output Summary (Last 24 hours) at 10/23/2022 1134 Last data filed at 10/23/2022 0439 Gross per 24 hour  Intake --  Output 2400 ml  Net -2400 ml   Filed Weights   10/20/22 0546 10/22/22 0417 10/23/22 0504  Weight: (!) 183.2 kg (!) 188.5 kg (!) 187.7 kg    Examination:  Morbidly obese male no acute distress Currently on trach collar with Passy-Muir valve in place O2 sats are 94% on 28% trach  collar at night where he dropped down to into the 60s during the night, #6 uncuffed XLT trach Heart sounds are regular Abdomen positive bowel sounds obese Lower extremity with 2+ edema  Resolved Hospital Problem list     Assessment & Plan:  Acute on chronic hypoxic respiratory failure -Currently on 3 L via ATC at baseline, per chart review it appears tracheostomy was placed 10/03/2021 due to failed SBT in setting of acute hypoxic respiratory failure.  It appears patient was hospitalized from 09/19/21 to 12/03/21 at this hospitalization -Patient has multiple admissions and ED visits for respiratory complaints/trach issues, it does not appear that he follows up with outpatient pulmonology currently. S/p chronic tracheostomy History of OSA/OHS HFpEF  P: Continue oxygen keep sats greater 90% Continue noninvasive mechanical ventilatory support at nighttime and at naps Elevated head of bed as tolerated Serial chest x-ray Diurese as able Prednisone taper Outpatient pulmonary follow-up He states he cannot tolerate a cuffed trach.  Best Practice (right click and "Reselect all SmartList Selections" daily)  Per primary   Critical care time: NA   Brett Canales Harriet Sutphen ACNP Acute Care Nurse Practitioner [facility] Pulmonary/Critical Care Please consult Amion 10/23/2022, 11:34 AM

## 2022-10-23 NOTE — Progress Notes (Signed)
eLink Physician-Brief Progress Note Patient Name: Gary [name] DOB: 1956/03/28 MRN: [id number]   Date of Service  10/23/2022  HPI/Events of Note  67 year old male suffering from acute on chronic hypoxic and hypercapnic respiratory failure in the setting of obesity hypoventilation, obstructive sleep apnea, and chronic tracheostomy dependence who has been encouraged to use his nocturnal ventilatory support.  Notified of ABG results acute hypercapnia with acidemia to 7.29.  This is while the patient was off the ventilator.  eICU Interventions  No changes in respiratory status overall.  No clear indication to intervene at this time.  Continue to encourage ventilatory support while sleeping/napping     Intervention Category Minor Interventions: Clinical assessment - ordering diagnostic tests  [name] 10/23/2022, 5:58 AM

## 2022-10-23 NOTE — Progress Notes (Signed)
PROGRESS NOTE    Gary [name]  OZH:[id number] DOB: [birth year] DOA: 10/16/2022 PCP: Eloisa Northern, MD   Brief Narrative:  Gary [name] is a 67 y.o. male with medical history significant of diverticulosis, OSA, OHS, obesity, hypertension, hyperlipidemia, GERD, substance use, depression, anxiety, anemia, COPD, rare chronic respiratory failure with hypoxia, CKD 3A, diastolic CHF presenting with shortness of breath -found to be profoundly hypoxic in the ED from baseline.  Hospitalist called for admission.  Patient's respiratory status continues to improve somewhat over the past 48 hours, appreciate pulmonology insight recommendations and adjustment in home nebulized medications.  At this time patient is refusing to be discharged to [facility] facility/LT[facility] which was her previous plan.  Given his improvement we will likely discharge home back on his home 3 L oxygen supplementation in the next 24 to 48 hours.   Assessment & Plan:   Active Problems:   Major depressive disorder, recurrent episode, moderate ([name])   Acute on chronic diastolic CHF (congestive heart failure) ([name])   COPD (chronic obstructive pulmonary disease) ([name])   HTN (hypertension)   OSA (obstructive sleep apnea)   Chronic obstructive pulmonary disease (COPD) ([name])   Morbid obesity with BMI of 50.0-59.9, adult ([name])   GERD without esophagitis   Anxiety and depression   Iron deficiency anemia   Polysubstance abuse ([name])   Hyperlipidemia   Acute on chronic respiratory failure with hypoxia ([name])  Acute on chronic respiratory failure with hypoxia COPD, questionably in exacerbation Acute on chronic chronic diastolic CHF Obstructive sleep apnea, obesity hypoventilation syndrome, chronic - Patient chronically hypoxic via trach collar at 3L at home per patient although he indicates "I adjusted as I need to" with no clear specificity - Continue to wean oxygen as appropriate, remains hypoxic from baseline - Oxygen downtrending  appropriately: SpO2: 90 % O2 Flow Rate (L/min): 6 L/min FiO2 (%): 28 % -Pulmonology evaluating patient, appreciate insight recommendations, continue Yupelri, Pulmicort, Brovana.  Respiratory symptoms improving -Steroids, nebs ongoing -Echo shows EF 65 to 70% normal diastolic function, normal right ventricle function - decrease furosemide to 40mg  BID(home dose 80mg  BID). -Continue CPAP as needed and at bedtime   Hypertension/hypotension -Borderline hypotensive, continue diuretics, midodrine  Hyperlipidemia -Would recommend statin, unclear if patient has intolerance, no allergies listed   Chronic colitis/Diverticulitis without acute flare -Chronic colitis versus segmental colitis associated with diverticulosis -Continue mesalamine   DVT History of GI bleed Iron deficiency anemia -Continues on Eliquis 2.5 mg twice daily -held earlier this year in March due to an acute GI bleed which has since resolved and not recurred -Chronic iron deficiency anemia, at baseline - Continue PPI   GERD - Continue home PPI   Obesity Body mass index is 50.37 kg/m.  DVT prophylaxis: Eliquis Code Status:   Code Status: Full Code Family Communication: None present  Status is: Inpatient  Dispo: The patient is from: Home              Anticipated d/c is to: LTAC              Anticipated d/c date is: 48 hours              Patient currently not medically stable for discharge  Consultants:  None  Procedures:  None  Antimicrobials:  None  Subjective: No acute issues or events overnight, patient denies nausea vomiting diarrhea constipation headache fevers chills or chest pain.  Shortness of breath improving drastically except with exertion.  Patient currently refusing discharge, despite plan over  the past week has been to discharge to [facility] once bed is made available.  Objective: Vitals:   10/23/22 0504 10/23/22 0540 10/23/22 0738 10/23/22 0837  BP:   130/68   Pulse:   80 81  Resp:   (!)  28   Temp:   98.2 F (36.8 C)   TempSrc:   Oral   SpO2:  93% 90%   Weight: (!) 187.7 kg     Height:        Intake/Output Summary (Last 24 hours) at 10/23/2022 1334 Last data filed at 10/23/2022 1234 Gross per 24 hour  Intake --  Output 3300 ml  Net -3300 ml   Filed Weights   10/20/22 0546 10/22/22 0417 10/23/22 0504  Weight: (!) 183.2 kg (!) 188.5 kg (!) 187.7 kg    Examination:  General: Somnolent but easily arousable, no acute distress. HEENT:  Normocephalic atraumatic.  Sclerae nonicteric, noninjected.  Extraocular movements intact bilaterally. Neck: Tracheostomy site clean dry intact. Lungs: Diminished bilaterally without overt wheeze or rhonchi. Heart:  Regular rate and rhythm.  Without murmurs, rubs, or gallops. Abdomen:  Soft, nontender, nondistended.  Without guarding or rebound. Extremities: Without cyanosis, clubbing, or obvious deformity.  1+ pitting edema bilateral lower extremity Skin:  Warm and dry, no erythema.  Data Reviewed: I have personally reviewed following labs and imaging studies  CBC: Recent Labs  Lab 10/16/22 2330 10/17/22 1326 10/19/22 0041 10/20/22 0024 10/21/22 0038 10/22/22 0050 10/23/22 0043  WBC 8.1   < > 5.8 5.9 5.4 4.6 4.7  NEUTROABS 5.8  --   --   --   --   --   --   HGB 9.3*   < > 9.7* 9.4* 9.8* 9.7* 9.5*  HCT 35.5*   < > 39.3 38.0* 38.6* 38.0* 38.1*  MCV 80.5   < > 83.8 83.0 81.3 84.4 83.6  PLT 145*   < > 119* 113* 97* 105* 95*   < > = values in this interval not displayed.   Basic Metabolic Panel: Recent Labs  Lab 10/18/22 0029 10/19/22 0041 10/20/22 0024 10/21/22 0038 10/22/22 0050 10/23/22 0043  NA 139 142 140 139 141 138  K 4.1 4.8 4.0 4.2 4.5 4.4  CL 100 101 96* 97* 97* 95*  CO2 31 33* 37* 36* 37* 36*  GLUCOSE 121* 71 105* 139* 125* 120*  BUN 29* 24* 26* 29* 26* 29*  CREATININE 1.20 1.21 1.38* 1.18 1.40* 1.53*  CALCIUM 7.2* 7.6* 7.8* 8.1* 8.3* 8.2*  MG 2.0  --   --   --   --   --    GFR: Estimated Creatinine  Clearance: 86.6 mL/min (A) (by C-G formula based on SCr of 1.53 mg/dL (H)). Liver Function Tests: Recent Labs  Lab 10/18/22 0029  AST 13*  ALT 21  ALKPHOS 96  BILITOT 0.6  PROT 6.1*  ALBUMIN 2.8*    No results found for this or any previous visit (from the past 240 hour(s)).    Radiology Studies: No results found.  Scheduled Meds:  apixaban  2.5 mg Oral BID   arformoterol  15 mcg Nebulization BID   budesonide (PULMICORT) nebulizer solution  0.5 mg Nebulization BID   docusate sodium  100 mg Oral Daily   ferrous sulfate  325 mg Oral Q breakfast   furosemide  80 mg Oral BID   melatonin  3 mg Oral QHS   mesalamine  2.4 g Oral BID   midodrine  10 mg Oral BID WC  pantoprazole  40 mg Oral QHS   predniSONE  10 mg Oral Q breakfast   revefenacin  175 mcg Nebulization Daily   sodium chloride flush  3 mL Intravenous Q12H   Continuous Infusions:   LOS: 6 days   Time spent:  Azucena Fallen, DO [facility]  If 7PM-7AM, please contact night-coverage [email or web address]  10/23/2022, 1:34 PM

## 2022-10-23 NOTE — Progress Notes (Signed)
Physical Therapy Treatment Patient Details Name: Gary [name] MRN: [id number] DOB: 1955-04-21 Today's Date: 10/23/2022   History of Present Illness 67 y.o. male presents to [facility] hospital on 10/16/2022 with reports of SOB and chest pain. Pt was in ED earlier this morning and elected to discharge home, however in route to home decided to return home for placement due to inadequate caregiver support. Of note, pt has had 4 admissions in the last 6 months, and 9 admissions in the last year. PMH includes obesity, hypoxia, respiratory failure with tracheostomy, COPD, GIB, HFpEF.    PT Comments  PT focus of session on seated and standing balance, short-distance gait. Pt requiring close guard for safety during mobility, pt declining PT physically assisting in any way in fear PT would "throw me off balance". Pt tolerated short-distance gait in room with use of RW and increased time, DOE 2/4 but tolerated well. PT encouraged second bout of gait given pt's goal is to "increase my stamina", pt declines today given fatigue. PT to continue to follow.      Assistance Recommended at Discharge Frequent or constant Supervision/Assistance  If plan is discharge home, recommend the following:  Can travel by private vehicle    A lot of help with walking and/or transfers;A lot of help with bathing/dressing/bathroom;Assistance with cooking/housework;Assist for transportation;Help with stairs or ramp for entrance      Equipment Recommendations  None recommended by PT    Recommendations for Other Services       Precautions / Restrictions Precautions Precautions: Fall Precaution Comments: trach collar (6LO2/28%; 35% FiO2 during gait) Restrictions Weight Bearing Restrictions: No RLE Weight Bearing: Weight bearing as tolerated     Mobility  Bed Mobility Overal bed mobility: Needs Assistance Bed Mobility: Supine to Sit     Supine to sit: Min guard, HOB elevated     General bed mobility comments: use of bed  features to come to EOB, increased time and use of bedrails    Transfers Overall transfer level: Needs assistance Equipment used: Rolling walker (2 wheels) Transfers: Sit to/from Stand Sit to Stand: Min guard, From elevated surface           General transfer comment: very eleavted bed height with feet nearly dangling, pt declines PT supporting pt due to fear it will make him lose his balance.    Ambulation/Gait Ambulation/Gait assistance: Min guard Gait Distance (Feet): 12 Feet Assistive device: Rolling walker (2 wheels) Gait Pattern/deviations: Step-through pattern, Wide base of support, Trunk flexed Gait velocity: decr     General Gait Details: close guard for safety, cues for upright posture and placement in RW   Stairs             Wheelchair Mobility     Tilt Bed    Modified Rankin (Stroke Patients Only)       Balance Overall balance assessment: Needs assistance Sitting-balance support: No upper extremity supported, Feet supported Sitting balance-[name] Scale: Fair Sitting balance - Comments: sitting EOB   Standing balance support: During functional activity, Bilateral upper extremity supported, Reliant on assistive device for balance Standing balance-[name] Scale: Poor Standing balance comment: with RW support                            Cognition Arousal/Alertness: Awake/alert Behavior During Therapy: WFL for tasks assessed/performed, Agitated Overall Cognitive Status: Within Functional Limits for tasks assessed  General Comments: periods of irritability, mostly when pt makes unsafe request that PT cannot allow (standing from bed height so tall his feet are dangling). Otherwise, pt following commands and is appropriate in conversation        Exercises      General Comments General comments (skin integrity, edema, etc.): 6L/28% at rest, 6L/35% during gait, SpO2 90% post-gait       Pertinent Vitals/Pain Pain Assessment Pain Assessment: Faces Faces Pain Scale: Hurts little more Pain Location: buttocks, from sitting Pain Descriptors / Indicators: Aching, Guarding, Grimacing Pain Intervention(s): Limited activity within patient's tolerance, Monitored during session, Repositioned    Home Living                          Prior Function            PT Goals (current goals can now be found in the care plan section) Acute Rehab PT Goals Patient Stated Goal: get stronger and go home PT Goal Formulation: With patient Time For Goal Achievement: 10/31/22 Potential to Achieve Goals: Fair Progress towards PT goals: Progressing toward goals    Frequency    Min 1X/week      PT Plan Current plan remains appropriate    Co-evaluation              AM-PAC PT "6 Clicks" Mobility   Outcome Measure  Help needed turning from your back to your side while in a flat bed without using bedrails?: A Little Help needed moving from lying on your back to sitting on the side of a flat bed without using bedrails?: A Little Help needed moving to and from a bed to a chair (including a wheelchair)?: A Lot Help needed standing up from a chair using your arms (e.g., wheelchair or bedside chair)?: A Lot Help needed to walk in hospital room?: A Lot Help needed climbing 3-5 steps with a railing? : Total 6 Click Score: 13    End of Session Equipment Utilized During Treatment: Oxygen Activity Tolerance: Patient limited by fatigue Patient left: in chair;with call bell/phone within reach;with chair alarm set Nurse Communication: Mobility status;Need for lift equipment PT Visit Diagnosis: Muscle weakness (generalized) (M62.81);Difficulty in walking, not elsewhere classified (R26.2)     Time: 1610-9604 PT Time Calculation (min) (ACUTE ONLY): 25 min  Charges:    $Therapeutic Activity: 8-22 mins $Neuromuscular Re-education: 8-22 mins PT General Charges $$ ACUTE PT  VISIT: 1 Visit                     Marye Round, PT DPT Acute Rehabilitation Services Secure Chat Preferred  Office [phone]    Anquanette Bahner E [name] 10/23/2022, 3:14 PM

## 2022-10-24 DIAGNOSIS — I5033 Acute on chronic diastolic (congestive) heart failure: Secondary | ICD-10-CM | POA: Diagnosis not present

## 2022-10-24 DIAGNOSIS — J441 Chronic obstructive pulmonary disease with (acute) exacerbation: Secondary | ICD-10-CM | POA: Diagnosis not present

## 2022-10-24 DIAGNOSIS — K219 Gastro-esophageal reflux disease without esophagitis: Secondary | ICD-10-CM | POA: Diagnosis not present

## 2022-10-24 DIAGNOSIS — J9621 Acute and chronic respiratory failure with hypoxia: Secondary | ICD-10-CM | POA: Diagnosis not present

## 2022-10-24 LAB — BASIC METABOLIC PANEL
Anion gap: 8 (ref 5–15)
BUN: 28 mg/dL — ABNORMAL HIGH (ref 8–23)
CO2: 35 mmol/L — ABNORMAL HIGH (ref 22–32)
Calcium: 8.4 mg/dL — ABNORMAL LOW (ref 8.9–10.3)
Chloride: 97 mmol/L — ABNORMAL LOW (ref 98–111)
Creatinine, Ser: 1.13 mg/dL (ref 0.61–1.24)
GFR, Estimated: >60 mL/min (ref >60)
Glucose, Bld: 141 mg/dL — ABNORMAL HIGH (ref 70–99)
Potassium: 3.7 mmol/L (ref 3.5–5.1)
Sodium: 140 mmol/L (ref 135–145)

## 2022-10-24 LAB — CBC
HCT: 37.6 % — ABNORMAL LOW (ref 39.0–52.0)
Hemoglobin: 9.5 g/dL — ABNORMAL LOW (ref 13.0–17.0)
MCH: 20.9 pg — ABNORMAL LOW (ref 26.0–34.0)
MCHC: 25.3 g/dL — ABNORMAL LOW (ref 30.0–36.0)
MCV: 82.8 fL (ref 80.0–100.0)
Platelets: 95 10*3/uL — ABNORMAL LOW (ref 150–400)
RBC: 4.54 MIL/uL (ref 4.22–5.81)
RDW: 19.3 % — ABNORMAL HIGH (ref 11.5–15.5)
WBC: 3.8 10*3/uL — ABNORMAL LOW (ref 4.0–10.5)
nRBC: 0 % (ref 0.0–0.2)

## 2022-10-24 MED ORDER — TRAMADOL HCL 50 MG PO TABS
50.0000 mg | ORAL_TABLET | Freq: Three times a day (TID) | ORAL | Status: DC | PRN
  Administered 2022-10-24 – 2022-11-10 (×21): 50 mg via ORAL
  Filled 2022-10-24 (×22): qty 1

## 2022-10-24 NOTE — Assessment & Plan Note (Signed)
Hgb has been stable.   

## 2022-10-24 NOTE — Assessment & Plan Note (Signed)
Continue blood pressure support with midodrine.

## 2022-10-24 NOTE — Assessment & Plan Note (Signed)
-   Continue with PPI 

## 2022-10-24 NOTE — Hospital Course (Addendum)
Gary [name] was admitted to the hospital with the working diagnosis of acute on chronic diastolic heart failure.    67 y.o. male with medical history significant of diverticulosis, OSA, OHS, obesity, hypertension, hyperlipidemia, GERD, substance use, depression, anxiety, anemia, COPD, rare chronic respiratory failure with hypoxia, CKD 3A, diastolic CHF presenting with shortness of breath. Recent hospitalization 06/12 to 06/24 for respiratory failure due to pneumonia.   Reported worsening dyspnea and lower extremity edema for the last 7 days prior to admission. Reported he was not able to get his medications, because he had no help at home. On his initial physical examination his blood pressure was 114/67, HR 93, RR 31 and 02 saturation 93%, trach in place,  lungs with decreased breath sounds bilaterally with bilateral rales, heart with S1 and S2 present and rhythmic, abdomen with no distention and positive lower extremity edema.   Chest radiograph with hypoinflation and poor penetration, positive cardiomegaly and bilateral hilar vascular congestion.    Patient's respiratory status continues to improve somewhat over the past 48 hours, appreciate pulmonology insight recommendations and adjustment in home nebulized medications.  At this time patient is refusing to be discharged to [facility] facility/[facility] which was her previous plan.    Plan to discharge home when sable 02 requirements, down to 6 L/min per trach collar.   07/13 pending disposition.   Interim History He was diuresed and renal function started worsening so Diuretics held and Nephrology consulted. ECHOcardiogram is being repeated and showed an EF of 55 to 60% but did show grade 1 diastolic dysfunction.  Further workup was done by nephrology and they are continue to hold his diuresis and his right upper quadrant ultrasound was done showed no hydronephrosis and urinalysis with unremarkable without concern.  Patient's urine sodium was very low  concerning for cardiorenal cardiology with also consulted and they feel that he is not very volume overloaded meaning holding diuretics and SGLT2 inhibitor at this time.  Assessment and Plan:  Acute on chronic diastolic CHF (congestive heart failure) ([name]) -Echocardiogram with preserved LV systolic function 60 to 70%, mild LVH, RV systolic function preserved, no significant valvular disease. Poor windows.  -Continued diuresis with oral diuretics using torsemide 60mg  BID but now stopped given worsening in Renal Fxn -Use metolazone intermittently. -Discontinued SGLT2 inhibitor given worsened Renal Fxn.  Intake/Output Summary (Last 24 hours) at 11/09/2022 1950 Last data filed at 11/09/2022 1741 Gross per 24 hour  Intake --  Output 3550 ml  Net -3550 ml   -Following strict intake and output; continue to follow daily weights and low-sodium diet. -Repeat echocardiogram done and showed "Left ventricular ejection fraction, by estimation, is 55 to 60%. The  left ventricle has normal function. The left ventricle has no regional wall motion abnormalities. There is moderate concentric left ventricular hypertrophy. Left ventricular  diastolic parameters are consistent with Grade I diastolic dysfunction  (impaired relaxation). Right ventricular systolic function is normal. The right ventricular size is normal. The mitral valve is normal in structure. No evidence of mitral valve regurgitation. No evidence of mitral stenosis.  The aortic valve is normal in structure. Aortic valve regurgitation is  not visualized. No aortic stenosis is present. Aortic dilatation noted. There is borderline dilatation of the ascending aorta, measuring 38 mm. The inferior vena cava is normal in size with greater than 50% respiratory variability, suggesting right atrial pressure of 3 mmHg. Technically limited study due to poor sound wave transmission. " -Cardiology consulted and they reviewed his echo and they feel  that he is not very  volume overloaded and recommends continue to hold diuretics but if the patient's renal function continues to decline and blood pressure low-salt diet and they are recommending placing a PICC line to measuring CVP   Acute on Chronic Hypoxemic Respiratory Failure: -At baseline, patient is on 6 L supplemental oxygen. -Patient is status trach placement that can be kappa. -Patient is morbidly obese. SpO2: 98 % O2 Flow Rate (L/min): 12 L/min FiO2 (%): 40 % -Patient is requiring higher than usual amount of supplemental oxygen (12 L/min). -Consider disposition to an LTAC facility to help when patient off this level supplemental oxygen if accepted -Repeat CXR this AM done and showed "Mild linear lower lung opacities, likely due to atelectasis. Cardiomegaly." -Cardiology feels that he will have some form of dyspnea due to his morbid obesity COPD and sedentary state and currently do not feel like a right heart cath is warranted at this time given that he does not appear very significantly volume overloaded   COPD (chronic obstructive pulmonary disease) ([name]) -Continue bronchodilator therapy and inhaled corticosteroids.  -Patient on chronic prednisone 10 mg daily.  -Continue trach care.  -continue PT and OT    HTN (hypertension) but now on the softer side. -Continued blood pressure support with Midodrine 10 mg p.o. twice daily. -Continue to follow vital signs. -Blood pressure stable. -Last BP reading was on the softer side at 110/74 but did drop to 87/57 earlier  Hypokalemia -Patient's K+ Level Trend: Recent Labs  Lab 11/03/22 1400 11/04/22 1359 11/05/22 0850 11/07/22 0928 11/07/22 1524 11/08/22 0801 11/09/22 0920  K 4.3 4.2 3.5 2.5* 3.3* 2.9* 3.3*  -Replete with po Kcl 40 mEQ BID  -Continue to Monitor and Replete as Necessary -Repeat CMP in the AM   GERD without esophagitis -Continue with PPI -Lifestyle changes discussed with patient.   History of DVT (deep vein  thrombosis) -Continue anticoagulation with apixaban.  -No signs of overt bleeding. -Reports no lower extremity pain currently.   Iron deficiency anemia/Microcytic Anemia -Hgb has been stable.  -Hgb/Hct Trend: Recent Labs  Lab 10/22/22 0050 10/23/22 0043 10/24/22 0925 10/31/22 0501 11/07/22 0928 11/08/22 0801 11/09/22 0920  HGB 9.7* 9.5* 9.5* 10.5* 11.8* 11.7* 10.9*  HCT 38.0* 38.1* 37.6* 40.2 43.6 43.1 41.1  MCV 84.4 83.6 82.8 80.4 77.4* 79.5* 80.7  -Check Anemia Panel in the AM, Continue to Monitor for S/Sx of Bleeding -No signs of overt bleeding appreciated. -Repeat CBC in the AM   Hyperlipidemia -Continue statin therapy.  -Heart healthy diet discussed with patient.   Chronic Colitis -Continue treatment with mesalamine.  -No signs of acute exacerbation.  -Patient reports no nausea, no vomiting and no abdominal pain at the moment. -Tolerating diet without problems.   AKI (acute kidney injury) ([name]), worsening  -Continue to closely follow renal function trend -Creatinine fluctuating with a creatinine in the 1.3-1.4 at the moment; patient renal function has been as high as 1.7--1.8 in the past. -BUN/Cr Trend: Recent Labs  Lab 11/03/22 1400 11/04/22 1359 11/05/22 0850 11/07/22 0928 11/07/22 1524 11/08/22 0801 11/09/22 0920  BUN 35* 41* 52* 72* 76* 83* 67*  CREATININE 1.44* 1.62* 1.87* 2.17* 2.02* 2.07* 1.67*  -Avoid Nephrotoxic Medications, Contrast Dyes, Hypotension and Dehydration to Ensure Adequate Renal Perfusion and will need to Renally Adjust Meds -Hold Torsemide for now -Continue to Follow Strict I's and O's and Monitor Urine Output -Continue to Monitor and Trend Renal Function carefully and repeat CMP in the AM  -Nephrology Consulted fro  further evaluation and recommendations patient underwent full workup and getting a urinalysis, urine sodium, renal ultrasound and recommending obtaining cardiology consultation given his issues with fluid retention;  nephrology recommending holding SGLT2 for now but consider restarting soon and considering further diuretics tomorrow after potassium is improved -Cardiology has recommended stopping the SGLT2 inhibitor again given the high risk for fungal infections and morbid obesity and do not feel that he is overtly volume overloaded and may be on the dry side.  They do not feel that he has cardiorenal syndrome at this time and recommending continue to hold diuretics for now Renal function and fluid status normalized. 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   Weight as of this encounter: 189.9 kg.  -Weight Loss and Dietary Counseling given and Low-calorie diet and portion control discussed with patient.

## 2022-10-24 NOTE — Assessment & Plan Note (Addendum)
Echocardiogram with preserved LV systolic function 60 to 70%, mild LVH, RV systolic function preserved, no significant valvular disease. Poor windows.   Urine output is 950 cc Systolic blood pressure 100 mmHg.   Will increase furosemide to 80 mg po bid.  Continue with spironolactone and SGLT 2 inh to augment diuresis.  Continue blood pressure support with midodrine.   Acute on chronic hypoxemic respiratory failure. Continue diuresis, supplemental 02 per trach collar, currently on 10 L/min needs to be down to 6 L/min in order to go home.

## 2022-10-24 NOTE — Assessment & Plan Note (Signed)
Continue with mesalamine.  No signs of acute exacerbation.

## 2022-10-24 NOTE — Progress Notes (Addendum)
  Progress Note   Patient: Gary [name]:[id number] DOB: [birth year] DOA: 10/16/2022     7 DOS: the patient was seen and examined on 10/24/2022   Brief hospital course: [name] is a 67 y.o. male with medical history significant of diverticulosis, OSA, OHS, obesity, hypertension, hyperlipidemia, GERD, substance use, depression, anxiety, anemia, COPD, rare chronic respiratory failure with hypoxia, CKD 3A, diastolic CHF presenting with shortness of breath -found to be profoundly hypoxic in the ED from baseline.  Hospitalist called for admission.   Patient's respiratory status continues to improve somewhat over the past 48 hours, appreciate pulmonology insight recommendations and adjustment in home nebulized medications.  At this time patient is refusing to be discharged to [facility] facility/[facility] which was her previous plan.  Given his improvement we will likely discharge home back on his home 3 L oxygen supplementation in the next 24 to 48 ho  Assessment and Plan: Acute on chronic diastolic CHF (congestive heart failure) ([name]) Echocardiogram with preserved LV systolic function 60 to 70%, mild LVH, RV systolic function preserved, no significant valvular disease. Poor windows.   Urine output is 2,800 cc Systolic blood pressure 127 to 128 mmHg.   Plan to continue diuresis with furosemide 40 mg po bid. Will add spironolactone and SGLT 2 inh to augment diuresis.  If tolerated will add ARB.   Acute on chronic hypoxemic respiratory failure. Continue diuresis, supplemental 02 per trach collar.    COPD (chronic obstructive pulmonary disease) ([name]) Continue bronchodilator therapy and inhaled corticosteroids.  Patient on prednisone 10 mg daily.  Continue trach care.  PT and OT. He is willing to try inpatient rehab.   HTN (hypertension) Continue blood pressure support with midodrine, he has been hypotensive.   GERD without esophagitis Continue with PPI  Morbid obesity with BMI of  50.0-59.9, adult ([name]) Calculated BMI is 50,3   Iron deficiency anemia Hgb has been stable.   Hyperlipidemia Continue statin therapy.   Chronic colitis Continue with mesalamine.  No signs of acute exacerbation.         Subjective: Patient is not feeling well, he is having back pain, continue very weak and deconditioned   Physical Exam: Vitals:   10/23/22 2030 10/23/22 2304 10/24/22 0456 10/24/22 0500  BP:      Pulse:      Resp:      Temp:      TempSrc:      SpO2: 95% 94% 92%   Weight:    (!) 187.7 kg  Height:       Neurology awake and alert ENT with mild pallor, trach in place with valve in place.  Cardiovascular with S1 and S2 present and rhythmic with no gallops, rubs or murmurs Respiratory with no rales or wheezing  Abdomen with no distention  Lower extremities with + edema  Data Reviewed:    Family Communication: no family at the bedside   Disposition: Status is: Inpatient Remains inpatient appropriate because: respiratory failure   Planned Discharge Destination: Home    Author: Coralie Keens, MD 10/24/2022 5:18 PM  For on call review [email or web address].

## 2022-10-24 NOTE — Assessment & Plan Note (Addendum)
Calculated BMI is 50,3   Acute on chronic back pain,improved with oral analgesics.

## 2022-10-24 NOTE — Progress Notes (Signed)
   NAME:  Gary [name], MRN:  [id number], DOB:  Jan 26, 1956, LOS: 7 ADMISSION DATE:  10/16/2022, CONSULTATION DATE:  10/20/2022 REFERRING MD: Dr. 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  Found to be profoundly hypoxic on ED arrival prompting hospitalist consultation and admission for further workup.  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 rate 19, height 6\' 4"  (1.93 m), weight (!) 187.7 kg, SpO2 92 %.    FiO2 (%):  [28 %-40 %] 40 %   Intake/Output Summary (Last 24 hours) at 10/24/2022 0957 Last data filed at 10/24/2022 4098 Gross per 24 hour  Intake 3 ml  Output 2800 ml  Net -2797 ml   Filed Weights   10/22/22 0417 10/23/22 0504 10/24/22 0500  Weight: (!) 188.5 kg (!) 187.7 kg (!) 187.7 kg    Examination:  Morbid obese male who is on trach collar with Passy-Muir valve in no acute distress reports wearing his ventilator at night. #6 XLT trach with Passy-Muir valve unremarkable Decreased  breath sounds at bases Heart sounds are distant Abdomen is obese soft nontender Positive lower extremity edema  Resolved Hospital Problem list     Assessment & Plan:  Acute on chronic hypoxic respiratory failure -Currently on 3 L via ATC at baseline, per chart review it appears tracheostomy was placed 10/03/2021 due to failed SBT in setting of acute hypoxic respiratory failure.  It appears patient was hospitalized from 09/19/21 to 12/03/21 at this hospitalization -Patient has multiple admissions and ED visits for respiratory complaints/trach issues, it does not appear that he follows up with outpatient pulmonology currently. S/p chronic tracheostomy History of OSA/OHS HFpEF  P: New oxygen to keep sats greater 90% Continue noninvasive nocturnally and with naps He does not want cuffed trach Diuresis as tolerated Prednisone taper Patient follow-up Pulmonary critical care will sign off at this time.  Best Practice (right click and "Reselect all SmartList Selections" daily)  Per primary   Critical care time: NA   Brett Canales Shaya Altamura ACNP Acute Care Nurse Practitioner [facility] Pulmonary/Critical Care Please consult Amion 10/24/2022, 9:57 AM

## 2022-10-24 NOTE — Assessment & Plan Note (Addendum)
Continue bronchodilator therapy and inhaled corticosteroids.  Patient on prednisone 10 mg daily.  Continue trach care.  PT and OT.

## 2022-10-24 NOTE — Assessment & Plan Note (Signed)
Continue statin therapy.

## 2022-10-25 DIAGNOSIS — K219 Gastro-esophageal reflux disease without esophagitis: Secondary | ICD-10-CM | POA: Diagnosis not present

## 2022-10-25 DIAGNOSIS — J441 Chronic obstructive pulmonary disease with (acute) exacerbation: Secondary | ICD-10-CM | POA: Diagnosis not present

## 2022-10-25 DIAGNOSIS — I5033 Acute on chronic diastolic (congestive) heart failure: Secondary | ICD-10-CM | POA: Diagnosis not present

## 2022-10-25 LAB — BASIC METABOLIC PANEL
Anion gap: 5 (ref 5–15)
BUN: 29 mg/dL — ABNORMAL HIGH (ref 8–23)
CO2: 36 mmol/L — ABNORMAL HIGH (ref 22–32)
Calcium: 8.3 mg/dL — ABNORMAL LOW (ref 8.9–10.3)
Chloride: 98 mmol/L (ref 98–111)
Creatinine, Ser: 1.24 mg/dL (ref 0.61–1.24)
GFR, Estimated: >60 mL/min (ref >60)
Glucose, Bld: 103 mg/dL — ABNORMAL HIGH (ref 70–99)
Potassium: 4.4 mmol/L (ref 3.5–5.1)
Sodium: 139 mmol/L (ref 135–145)

## 2022-10-25 LAB — MAGNESIUM: Magnesium: 2.4 mg/dL (ref 1.7–2.4)

## 2022-10-25 NOTE — Progress Notes (Signed)
Patient taken off BIPAP and placed on 40% trach collar, currently tolerating well. RT will continue to monitor.

## 2022-10-25 NOTE — Progress Notes (Addendum)
Progress Note   Patient: Gary [name] ZOX:[id number] DOB: [birth year] DOA: 10/16/2022     8 DOS: the patient was seen and examined on 10/25/2022   Brief hospital course: Gary [name] was admitted to the hospital with the working diagnosis of acute on chronic diastolic heart failure.    67 y.o. male with medical history significant of diverticulosis, OSA, OHS, obesity, hypertension, hyperlipidemia, GERD, substance use, depression, anxiety, anemia, COPD, rare chronic respiratory failure with hypoxia, CKD 3A, diastolic CHF presenting with shortness of breath. Recent hospitalization 06/12 to 06/24 for respiratory failure due to pneumonia.   Reported worsening dyspnea and lower extremity edema for the last 7 days prior to admission. Reported he was not able to get his medications, because he had no help at home. On his initial physical examination his blood pressure was 114/67, HR 93, RR 31 and 02 saturation 93%, trach in place,  lungs with decreased breath sounds bilaterally with bilateral rales, heart with S1 and S2 present and rhythmic, abdomen with no distention and positive lower extremity edema.   Chest radiograph with hypoinflation and poor penetration, positive cardiomegaly and bilateral hilar vascular congestion.    Patient's respiratory status continues to improve somewhat over the past 48 hours, appreciate pulmonology insight recommendations and adjustment in home nebulized medications.  At this time patient is refusing to be discharged to [facility] facility/[facility] which was her previous plan.  Given his improvement we will likely discharge home back on his home 3 L oxygen supplementation in the next 24 to 48 ho  Assessment and Plan: Acute on chronic diastolic CHF (congestive heart failure) ([name]) Echocardiogram with preserved LV systolic function 60 to 70%, mild LVH, RV systolic function preserved, no significant valvular disease. Poor windows.   Urine output is 1,500 cc Systolic blood pressure  100 mmHg.   Plan to continue diuresis with furosemide 40 mg po bid. Continue with spironolactone and SGLT 2 inh to augment diuresis.  Continue blood pressure support with midodrine.   Acute on chronic hypoxemic respiratory failure. Continue diuresis, supplemental 02 per trach collar.   COPD (chronic obstructive pulmonary disease) ([name]) Continue bronchodilator therapy and inhaled corticosteroids.  Patient on prednisone 10 mg daily.  Continue trach care.  PT and OT. He is willing to try inpatient rehab.   HTN (hypertension) Continue blood pressure support with midodrine.  GERD without esophagitis Continue with PPI  Morbid obesity with BMI of 50.0-59.9, adult ([name]) Calculated BMI is 50,3   Acute on chronic back pain,improved with oral analgesics.   Iron deficiency anemia Hgb has been stable.   Hyperlipidemia Continue statin therapy.   Chronic colitis Continue with mesalamine.  No signs of acute exacerbation.         Subjective: Patient is feeling better, no chest pain or dyspnea, edema continue to improve.   Physical Exam: Vitals:   10/24/22 2356 10/25/22 0556 10/25/22 0855 10/25/22 0919  BP:    110/63  Pulse: 83 79  (!) 135  Resp: 15 15  18   Temp:    98.2 F (36.8 C)  TempSrc:    Oral  SpO2: 92% 90% 92% 93%  Weight:      Height:       Neurology awake and alert ENT with mild pallor Cardiovascular with S1 and S2 present and rhythmic, positive systolic murmur at the apex, with no gallops. Respiratory with no rales or wheezing, no rhonchi Abdomen with no distention  Lower extremity edema + pitting  Data Reviewed:    Family  Communication: no family at the bedside   Disposition: Status is: Inpatient Remains inpatient appropriate because: pending placement.   Planned Discharge Destination: possible LTAC for further rehab.     Author: Coralie Keens, MD 10/25/2022 12:57 PM  For on call review [email or web address].

## 2022-10-25 NOTE — Plan of Care (Signed)
?  Problem: Education: ?Goal: Ability to verbalize understanding of medication therapies will improve ?Outcome: Progressing ?  ?Problem: Education: ?Goal: Knowledge of General Education information will improve ?Description: Including pain rating scale, medication(s)/side effects and non-pharmacologic comfort measures ?Outcome: Progressing ?  ?

## 2022-10-25 NOTE — Progress Notes (Signed)
Occupational Therapy Treatment Patient Details Name: Gary [name] MRN: [id number] DOB: 1955/12/21 Today's Date: 10/25/2022   History of present illness 67 y.o. male presents to [facility] hospital on 10/16/2022 with reports of SOB and chest pain. Pt was in ED earlier this morning and elected to discharge home, however in route to home decided to return home for placement due to inadequate caregiver support. Of note, pt has had 4 admissions in the last 6 months, and 9 admissions in the last year. PMH includes obesity, hypoxia, respiratory failure with tracheostomy, COPD, GIB, HFpEF.   OT comments  Pt agreeable to sitting EOB and participated in grooming. Able to get to EOB with min guard assist, needs assist to get LEs back into bed. Pt declined OOB to chair or to work on sit<>stand. Patient will benefit from continued inpatient follow up therapy, <3 hours/day. Pt asking for inpatient rehab, but does not have a caregiver at home.    Recommendations for follow up therapy are one component of a multi-disciplinary discharge planning process, led by the attending physician.  Recommendations may be updated based on patient status, additional functional criteria and insurance authorization.    Assistance Recommended at Discharge Frequent or constant Supervision/Assistance  Patient can return home with the following  Two people to help with walking and/or transfers;A lot of help with bathing/dressing/bathroom;Assistance with cooking/housework;Assist for transportation;Help with stairs or ramp for entrance   Equipment Recommendations  Other (comment) (defer to next venue)    Recommendations for Other Services      Precautions / Restrictions Precautions Precautions: Fall Precaution Comments: trach collar (6LO2/28%; 35% FiO2 during gait) Restrictions Weight Bearing Restrictions: No       Mobility Bed Mobility Overal bed mobility: Needs Assistance Bed Mobility: Supine to Sit, Sit to Supine     Supine  to sit: Min guard, HOB elevated Sit to supine: Mod assist   General bed mobility comments: HOB up, assist for LEs back into bed    Transfers                   General transfer comment: refused OOB to chair or to work on sit<>stand     Balance Overall balance assessment: Needs assistance   Sitting balance-[name] Scale: Fair                                     ADL either performed or assessed with clinical judgement   ADL Overall ADL's : Needs assistance/impaired     Grooming: Wash/dry hands;Wash/dry face;Oral care;Brushing hair;Sitting;Set up                                      Extremity/Trunk Assessment              Vision       Perception     Praxis      Cognition Arousal/Alertness: Awake/alert Behavior During Therapy: Flat affect Overall Cognitive Status: Within Functional Limits for tasks assessed                                 General Comments: irritability, did not seems to understand need for caregiver at home in order to go to inpatient rehab, thinks home health is going to provide 24 hour care  Exercises      Shoulder Instructions       General Comments      Pertinent Vitals/ Pain       Pain Assessment Pain Assessment: No/denies pain  Home Living                                          Prior Functioning/Environment              Frequency  Min 1X/week        Progress Toward Goals  OT Goals(current goals can now be found in the care plan section)  Progress towards OT goals: Progressing toward goals  Acute Rehab OT Goals OT Goal Formulation: With patient Time For Goal Achievement: 10/31/22 Potential to Achieve Goals: Good  Plan Discharge plan remains appropriate    Co-evaluation                 AM-PAC OT "6 Clicks" Daily Activity     Outcome Measure   Help from another person eating meals?: None Help from another person taking care  of personal grooming?: A Little Help from another person toileting, which includes using toliet, bedpan, or urinal?: A Lot Help from another person bathing (including washing, rinsing, drying)?: A Lot Help from another person to put on and taking off regular upper body clothing?: A Little Help from another person to put on and taking off regular lower body clothing?: A Lot 6 Click Score: 16    End of Session Equipment Utilized During Treatment: Oxygen  OT Visit Diagnosis: Unsteadiness on feet (R26.81);Other abnormalities of gait and mobility (R26.89);Muscle weakness (generalized) (M62.81);Adult, failure to thrive (R62.7)   Activity Tolerance Patient tolerated treatment well   Patient Left in bed;with call bell/phone within reach;with bed alarm set   Nurse Communication          Time: 0272-5366 OT Time Calculation (min): 17 min  Charges: OT General Charges $OT Visit: 1 Visit OT Treatments $Self Care/Home Management : 8-22 mins  Berna Spare, OTR/L Acute Rehabilitation Services Office: [phone]   Evern Bio 10/25/2022, 9:53 AM

## 2022-10-26 ENCOUNTER — Other Ambulatory Visit (HOSPITAL_COMMUNITY): Payer: Self-pay

## 2022-10-26 DIAGNOSIS — K219 Gastro-esophageal reflux disease without esophagitis: Secondary | ICD-10-CM | POA: Diagnosis not present

## 2022-10-26 DIAGNOSIS — J441 Chronic obstructive pulmonary disease with (acute) exacerbation: Secondary | ICD-10-CM | POA: Diagnosis not present

## 2022-10-26 DIAGNOSIS — I5033 Acute on chronic diastolic (congestive) heart failure: Secondary | ICD-10-CM | POA: Diagnosis not present

## 2022-10-26 LAB — RENAL FUNCTION PANEL
Albumin: 2.7 g/dL — ABNORMAL LOW (ref 3.5–5.0)
Anion gap: 5 (ref 5–15)
BUN: 26 mg/dL — ABNORMAL HIGH (ref 8–23)
CO2: 36 mmol/L — ABNORMAL HIGH (ref 22–32)
Calcium: 8.5 mg/dL — ABNORMAL LOW (ref 8.9–10.3)
Chloride: 99 mmol/L (ref 98–111)
Creatinine, Ser: 1.1 mg/dL (ref 0.61–1.24)
GFR, Estimated: >60 mL/min (ref >60)
Glucose, Bld: 125 mg/dL — ABNORMAL HIGH (ref 70–99)
Phosphorus: 4 mg/dL (ref 2.5–4.6)
Potassium: 4.2 mmol/L (ref 3.5–5.1)
Sodium: 140 mmol/L (ref 135–145)

## 2022-10-26 MED ORDER — FUROSEMIDE 40 MG PO TABS
80.0000 mg | ORAL_TABLET | Freq: Two times a day (BID) | ORAL | Status: DC
  Administered 2022-10-26 – 2022-10-28 (×4): 80 mg via ORAL
  Filled 2022-10-26 (×4): qty 2

## 2022-10-26 MED ORDER — DAPAGLIFLOZIN PROPANEDIOL 10 MG PO TABS
10.0000 mg | ORAL_TABLET | Freq: Every day | ORAL | Status: DC
  Administered 2022-10-26 – 2022-11-07 (×13): 10 mg via ORAL
  Filled 2022-10-26 (×13): qty 1

## 2022-10-26 NOTE — TOC Progression Note (Addendum)
Transition of Care [facility]) - Progression Note    Patient Details  Name: Gary [name] MRN: [id number] Date of Birth: 07-25-1955  [facility] CM/SW Contact  [name], RN Phone Number: 10/26/2022, 11:19 AM  Clinical Narrative:    Per MD patient will be going home with HHPT, HHOT.  NCM has spoken with patient offered choice for [facility] services he states he would like [facility]. He states he had [facility] in the past but would like [facility] this time.  NCM made referral to As[place] with [facility]. This was cancelled. NCM found out that patient is set up with [name] and that Molly Maduro with [facility] is waiting on [name] the patient's sign other to sign the form for the Nurse to come out for trach care.  This NCM has tried to call her with no answer , the patient has tried to call her with no answer.  Per MD may try to dc on Monday,  he also has medications with [facility] in [place], they are closed on the weekend so he will not be able to get his meds, will call them on Monday so they can deliver meds. But he can not be discharged until a [facility] is set up for the trach .  Per Kandee Keen the HHPT and HHOT can not go out until the Nurse is set up, so we are trying to get [name] to sign the form, which has been emailed to her from [facility].    Expected Discharge Plan: Skilled Nursing Facility Barriers to Discharge: Continued Medical Work up  Expected Discharge Plan and Services       Living arrangements for the past 2 months: Single Family Home                           HH Arranged: PT, RN (Resumption of care) HH Agency: [facility] Date [facility] Agency Contacted: 10/19/22 Time HH Agency Contacted: 1320 Representative spoke with at [facility] Agency: [name]   Social Determinants of Health (SDOH) Interventions SDOH Screenings   Food Insecurity: No Food Insecurity (10/18/2022)  Housing: Patient Declined (10/18/2022)  Transportation Needs: No  Transportation Needs (10/18/2022)  Recent Concern: Transportation Needs - Unmet Transportation Needs (08/13/2022)  Utilities: Not At Risk (10/18/2022)  Depression (PHQ2-9): Low Risk  (06/07/2021)  Financial Resource Strain: Medium Risk (05/03/2017)  Physical Activity: Insufficiently Active (05/03/2017)  Social Connections: Unknown (08/06/2022)   Received from [facility]  Stress: No Stress Concern Present (05/03/2017)  Tobacco Use: Medium Risk (10/16/2022)    Readmission Risk Interventions    12/31/2021    8:58 AM 12/20/2021    4:21 PM 05/04/2021    1:26 PM  Readmission Risk Prevention Plan  Transportation Screening Complete Complete Complete  Medication Review Oceanographer) Complete Complete Complete  PCP or Specialist appointment within 3-5 days of discharge Complete Complete Complete  HRI or Home Care Consult Complete Complete Complete  SW Recovery Care/Counseling Consult Complete Complete Complete  Palliative Care Screening Complete Complete Not Applicable  Skilled Nursing Facility Not Applicable Not Applicable Not Applicable

## 2022-10-26 NOTE — TOC Benefit Eligibility Note (Signed)
Pharmacy Patient Advocate Encounter  Insurance verification completed.    The patient is insured through [facility] Mediicare Part D  Ran test claim for Farxiga 10 mg and the current 30 day co-pay is $0.00.  Ran test claim for Jardiance 10 mg and the current 30 day co-pay is $0.00.   This test claim was processed through [facility]- copay amounts may vary at other pharmacies due to pharmacy/plan contracts, or as the patient moves through the different stages of their insurance plan.    Roland Earl, CPHT Pharmacy Patient Advocate Specialist [facility] Patient Advocate Team Direct Number: [phone]  Fax: [phone]

## 2022-10-26 NOTE — Progress Notes (Signed)
Patient refused 2400 and 0400 vital signs. Patient stated that "she wouldn't help me when I called her". I explained to patient that she/we are sometimes busy and can not always get here as soon as he calls. I also told him that the MD needs them in the AM. I tried to get the vital signs myself and he refused.

## 2022-10-26 NOTE — Plan of Care (Signed)
  Problem: Activity: Goal: Capacity to carry out activities will improve Outcome: Not Progressing   

## 2022-10-26 NOTE — Progress Notes (Signed)
PT Cancellation Note  Patient Details Name: Gary [name] MRN: [id number] DOB: [birth year]   Cancelled Treatment:    Reason Eval/Treat Not Completed: Patient declined, no reason specified - pt states he stood from EOB, marched in place x2 minutes, and got to the chair earlier, declines any further mobility today. PT to check back at a later date.    Marye Round, PT DPT Acute Rehabilitation Services Secure Chat Preferred  Office [phone]    Truddie Coco 10/26/2022, 2:48 PM

## 2022-10-26 NOTE — Progress Notes (Signed)
Occupational Therapy Treatment Patient Details Name: Gary [name] MRN: [id number] DOB: 1956-04-04 Today's Date: 10/26/2022   History of present illness 67 y.o. male presents to [facility] hospital on 10/16/2022 with reports of SOB and chest pain. Pt was in ED earlier this morning and elected to discharge home, however in route to home decided to return home for placement due to inadequate caregiver support. Of note, pt has had 4 admissions in the last 6 months, and 9 admissions in the last year. PMH includes obesity, hypoxia, respiratory failure with tracheostomy, COPD, GIB, HFpEF.   OT comments  Pt. Seen for skilled OT treatment.  Seated in recliner upon arrival and reports not wanting to move right now secondary to feeling sob today.  Agreeable to grooming tasks in chair.  Able to wash face/head and perform oral care.  Focused on energy conservation and breathing strategies especially during oral care as that can throw off his breathing.  Pt. Was receptive to cues and able to complete all tasks with good pacing and rest breaks between each.  Verbalized that he liked the slower pace and feeling like it helped him be able to complete the 2nd task after he had rested.  Agree with current d/c recommendations.     Recommendations for follow up therapy are one component of a multi-disciplinary discharge planning process, led by the attending physician.  Recommendations may be updated based on patient status, additional functional criteria and insurance authorization.    Assistance Recommended at Discharge Frequent or constant Supervision/Assistance  Patient can return home with the following  Two people to help with walking and/or transfers;A lot of help with bathing/dressing/bathroom;Assistance with cooking/housework;Assist for transportation;Help with stairs or ramp for entrance   Equipment Recommendations  Other (comment)    Recommendations for Other Services      Precautions / Restrictions  Precautions Precautions: Fall Precaution Comments: trach collar (6LO2/28%; 35% FiO2 during gait) Restrictions RLE Weight Bearing: Partial weight bearing       Mobility Bed Mobility                    Transfers                         Balance                                           ADL either performed or assessed with clinical judgement   ADL Overall ADL's : Needs assistance/impaired     Grooming: Wash/dry hands;Wash/dry face;Oral care;Sitting;Cueing for compensatory techniques Grooming Details (indicate cue type and reason): cues for breathing strategies and pacing of tasks for productive energy conservation during adls                               General ADL Comments: pt. reports feeling sob today and opts to stay in chair for session. 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 Likes cooking shows. Also reports he likes spicy food.    Pertinent Vitals/ Pain       Pain Assessment Pain Assessment: No/denies pain  Home Living                                          Prior Functioning/Environment              Frequency  Min 1X/week        Progress Toward Goals  OT Goals(current goals can now be found in the care plan section)  Progress towards OT goals: Progressing toward goals     Plan Discharge plan remains appropriate    Co-evaluation                 AM-PAC OT "6  Clicks" Daily Activity     Outcome Measure   Help from another person eating meals?: None Help from another person taking care of personal grooming?: A Little Help from another person toileting, which includes using toliet, bedpan, or urinal?: A Lot Help from another person bathing (including washing, rinsing, drying)?: A Lot Help from another person to put on and taking off regular upper body clothing?: A Little Help from another person to put on and taking off regular lower body clothing?: A Lot 6 Click Score: 16    End of Session Equipment Utilized During Treatment: Oxygen  OT Visit Diagnosis: Unsteadiness on feet (R26.81);Other abnormalities of gait and mobility (R26.89);Muscle weakness (generalized) (M62.81);Adult, failure to thrive (R62.7)   Activity Tolerance Patient tolerated treatment well   Patient Left in chair;with call bell/phone within reach   Nurse Communication          Time: 1610-9604 OT Time Calculation (min): 13 min  Charges: OT General Charges $OT Visit: 1 Visit OT Treatments $Self Care/Home Management : 8-22 mins  Boneta Lucks, COTA/L Acute Rehabilitation [phone]   Alessandra Bevels Lorraine-COTA/L 10/26/2022, 12:44 PM

## 2022-10-26 NOTE — Progress Notes (Signed)
IVT consult placed for PIV. Patient is not receiving IV medications at this time, nor does he have PRN medications ordered. Secure chat sent to primary RN instructing her to place new consult should patient start IV medications or a new need arise.  "Just in case" PIV's are avoided to preserve vasculature.  Razan Siler [facility] Mast, RN

## 2022-10-26 NOTE — Progress Notes (Signed)
Progress Note   Patient: Gary [name] NGE:[id number] DOB: Jun 23, 1955 DOA: 10/16/2022     9 DOS: the patient was seen and examined on 10/26/2022   Brief hospital course: [name] was admitted to the hospital with the working diagnosis of acute on chronic diastolic heart failure.    67 y.o. male with medical history significant of diverticulosis, OSA, OHS, obesity, hypertension, hyperlipidemia, GERD, substance use, depression, anxiety, anemia, COPD, rare chronic respiratory failure with hypoxia, CKD 3A, diastolic CHF presenting with shortness of breath. Recent hospitalization 06/12 to 06/24 for respiratory failure due to pneumonia.   Reported worsening dyspnea and lower extremity edema for the last 7 days prior to admission. Reported he was not able to get his medications, because he had no help at home. On his initial physical examination his blood pressure was 114/67, HR 93, RR 31 and 02 saturation 93%, trach in place,  lungs with decreased breath sounds bilaterally with bilateral rales, heart with S1 and S2 present and rhythmic, abdomen with no distention and positive lower extremity edema.   Chest radiograph with hypoinflation and poor penetration, positive cardiomegaly and bilateral hilar vascular congestion.    Patient's respiratory status continues to improve somewhat over the past 48 hours, appreciate pulmonology insight recommendations and adjustment in home nebulized medications.  At this time patient is refusing to be discharged to [facility] facility/[facility] which was her previous plan.    Plan to discharge home when sable 02 requirements, down to 6 L/min per trach collar.   Assessment and Plan: Acute on chronic diastolic CHF (congestive heart failure) ([name]) Echocardiogram with preserved LV systolic function 60 to 70%, mild LVH, RV systolic function preserved, no significant valvular disease. Poor windows.   Urine output is 950 cc Systolic blood pressure 100 mmHg.   Will increase  furosemide to 80 mg po bid.  Continue with spironolactone and SGLT 2 inh to augment diuresis.  Continue blood pressure support with midodrine.   Acute on chronic hypoxemic respiratory failure. Continue diuresis, supplemental 02 per trach collar, currently on 10 L/min needs to be down to 6 L/min in order to go home.   COPD (chronic obstructive pulmonary disease) ([name]) Continue bronchodilator therapy and inhaled corticosteroids.  Patient on prednisone 10 mg daily.  Continue trach care.  PT and OT.  HTN (hypertension) Continue blood pressure support with midodrine.  GERD without esophagitis Continue with PPI  Morbid obesity with BMI of 50.0-59.9, adult ([name]) Calculated BMI is 50,3   Acute on chronic back pain,improved with oral analgesics.   Iron deficiency anemia Hgb has been stable.   Hyperlipidemia Continue statin therapy.   Chronic colitis Continue with mesalamine.  No signs of acute exacerbation.         Subjective: Patient with improvement in dyspnea and edema. He continue to decline [facility].   Physical Exam: Vitals:   10/26/22 0100 10/26/22 0737 10/26/22 0857 10/26/22 1104  BP:   104/60   Pulse:  88 84 82  Resp:  18 18 18   Temp:   98.5 F (36.9 C)   TempSrc:   Oral   SpO2: 100% 100% 98%   Weight:      Height:       Neurology awake and alert ENT with trach in place Cardiovascular with S1 and S2 present and rhythmic with no gallops, or rubs Respiratory with decreased breath sounds at dependent zones with no wheezing, or rales Abdomen with no distention  Lower extremity edema + pitting  Data Reviewed:  Family Communication: no family at the bedside   Disposition: Status is: Inpatient Remains inpatient appropriate because: high 02 requirements   Planned Discharge Destination: Home     Author: Coralie Keens, MD 10/26/2022 11:57 AM  For on call review [email or web address].

## 2022-10-27 DIAGNOSIS — Z86718 Personal history of other venous thrombosis and embolism: Secondary | ICD-10-CM

## 2022-10-27 DIAGNOSIS — N179 Acute kidney failure, unspecified: Secondary | ICD-10-CM | POA: Insufficient documentation

## 2022-10-27 DIAGNOSIS — N189 Chronic kidney disease, unspecified: Secondary | ICD-10-CM | POA: Insufficient documentation

## 2022-10-27 DIAGNOSIS — K219 Gastro-esophageal reflux disease without esophagitis: Secondary | ICD-10-CM | POA: Diagnosis not present

## 2022-10-27 DIAGNOSIS — J441 Chronic obstructive pulmonary disease with (acute) exacerbation: Secondary | ICD-10-CM | POA: Diagnosis not present

## 2022-10-27 DIAGNOSIS — I5033 Acute on chronic diastolic (congestive) heart failure: Secondary | ICD-10-CM | POA: Diagnosis not present

## 2022-10-27 LAB — BASIC METABOLIC PANEL
Anion gap: 7 (ref 5–15)
BUN: 24 mg/dL — ABNORMAL HIGH (ref 8–23)
CO2: 33 mmol/L — ABNORMAL HIGH (ref 22–32)
Calcium: 8.4 mg/dL — ABNORMAL LOW (ref 8.9–10.3)
Chloride: 99 mmol/L (ref 98–111)
Creatinine, Ser: 1.3 mg/dL — ABNORMAL HIGH (ref 0.61–1.24)
GFR, Estimated: >60 mL/min (ref >60)
Glucose, Bld: 100 mg/dL — ABNORMAL HIGH (ref 70–99)
Potassium: 4.6 mmol/L (ref 3.5–5.1)
Sodium: 139 mmol/L (ref 135–145)

## 2022-10-27 NOTE — Progress Notes (Addendum)
Progress Note   Patient: HENLEY WAHEED WJX:[id number] DOB: 09/03/1955 DOA: 10/16/2022     10 DOS: the patient was seen and examined on 10/27/2022   Brief hospital course: Mr. Bucco was admitted to the hospital with the working diagnosis of acute on chronic diastolic heart failure.    67 y.o. male with medical history significant of diverticulosis, OSA, OHS, obesity, hypertension, hyperlipidemia, GERD, substance use, depression, anxiety, anemia, COPD, rare chronic respiratory failure with hypoxia, CKD 3A, diastolic CHF presenting with shortness of breath. Recent hospitalization 06/12 to 06/24 for respiratory failure due to pneumonia.   Reported worsening dyspnea and lower extremity edema for the last 7 days prior to admission. Reported he was not able to get his medications, because he had no help at home. On his initial physical examination his blood pressure was 114/67, HR 93, RR 31 and 02 saturation 93%, trach in place,  lungs with decreased breath sounds bilaterally with bilateral rales, heart with S1 and S2 present and rhythmic, abdomen with no distention and positive lower extremity edema.   Chest radiograph with hypoinflation and poor penetration, positive cardiomegaly and bilateral hilar vascular congestion.    Patient's respiratory status continues to improve somewhat over the past 48 hours, appreciate pulmonology insight recommendations and adjustment in home nebulized medications.  At this time patient is refusing to be discharged to [facility] facility/[facility] which was her previous plan.    Plan to discharge home when sable 02 requirements, down to 6 L/min per trach collar.   07/13 pending disposition.   Assessment and Plan: Acute on chronic diastolic CHF (congestive heart failure) ([name]) Echocardiogram with preserved LV systolic function 60 to 70%, mild LVH, RV systolic function preserved, no significant valvular disease. Poor windows.   Urine output is 1,400 cc, since admission fluid  balance negative -17,192 ml.  Systolic blood pressure 105 to 130  mmHg.   Continue with furosemide to 80 mg po bid.  Spironolactone and SGLT 2 inh to augment diuresis.  Continue blood pressure support with midodrine.   Acute on chronic hypoxemic respiratory failure. Continue diuresis, supplemental 02 per trach collar, currently on 10 L/min with 02 saturation 100%. Wean as tolerated to keep 02 saturation 92% or greater.    COPD (chronic obstructive pulmonary disease) ([name]) Continue bronchodilator therapy and inhaled corticosteroids.  Patient on prednisone 10 mg daily.  Continue trach care.  PT and OT.  HTN (hypertension) Continue blood pressure support with midodrine.  GERD without esophagitis Continue with PPI  Morbid obesity with BMI of 50.0-59.9, adult ([name]) Calculated BMI is 50,3   Acute on chronic back pain,improved with oral analgesics.   History of DVT (deep vein thrombosis) Continue anticoagulation with apixaban.   Iron deficiency anemia Hgb has been stable.   Hyperlipidemia Continue statin therapy.   Chronic colitis Continue with mesalamine.  No signs of acute exacerbation.   AKI (acute kidney injury) ([name]) Renal function with serum cr at 1,30 with K at 4,6 and serum bicarbonate at 33. Na 139 and Mg 2,4   Plan to continue diuresis with oral furosemide and follow up renal function and electrolytes.         Subjective: Patient is feeling better, but continue very weak and deconditioned, edema is improving.   Physical Exam: Vitals:   10/27/22 0812 10/27/22 0813 10/27/22 0821 10/27/22 1100  BP:   128/65   Pulse:   78 82  Resp:   18 20  Temp:   97.7 F (36.5 C)   TempSrc:  Oral   SpO2: 98% 98% 100% 100%  Weight:      Height:       Neurology awake and alert ENT with trach in place Cardiovascular with S1 and S2 present and rhythmic with no gallops, rubs or murmurs Respiratory with no rales or wheezing, no rhonchi Abdomen with no distention   Lower extremity edema +  Data Reviewed:    Family Communication: no family at the bedside   Disposition: Status is: Inpatient Remains inpatient appropriate because: pending disposition, he has declined [facility]   Planned Discharge Destination: to be determined.      Author: Coralie Keens, MD 10/27/2022 3:54 PM  For on call review [email or web address].

## 2022-10-27 NOTE — Plan of Care (Signed)
  Problem: Education: Goal: Ability to demonstrate management of disease process will improve Outcome: Progressing   Problem: Education: Goal: Ability to verbalize understanding of medication therapies will improve Outcome: Progressing   Problem: Education: Goal: Individualized Educational Video(s) Outcome: Progressing   Problem: Activity: Goal: Capacity to carry out activities will improve Outcome: Progressing   Problem: Cardiac: Goal: Ability to achieve and maintain adequate cardiopulmonary perfusion will improve Outcome: Progressing   Problem: Education: Goal: Knowledge of General Education information will improve Description: Including pain rating scale, medication(s)/side effects and non-pharmacologic comfort measures Outcome: Progressing

## 2022-10-27 NOTE — Progress Notes (Signed)
Mobility Specialist Progress Note    10/27/22 1513  Mobility  Activity  (STS x2 Attempt)  Level of Assistance +2 (takes two people)  Administrator, Civil Service  Activity Response Tolerated fair  $Mobility charge 1 Mobility  Mobility Specialist Start Time (ACUTE ONLY) 1436  Mobility Specialist Stop Time (ACUTE ONLY) 1512  Mobility Specialist Time Calculation (min) (ACUTE ONLY) 36 min   Pt received and initially refusing because it was too late in the afternoon. Pt then agreeable because he liked the "concept" of the [employer]. Attempted x2 STS. On first attempt, pt did not engage and arms rose above his head. Explained to pt that he needed to hold onto the [facility] tight, lean forward, and engage his leg muscles. Pt agreed. On second attempt, pt was engaged for a few inches then released his grip. Pt stated he was not strong enough to hold onto the handles. Left in chair with call bell in reach.   [place] Mobility Specialist  Please Neurosurgeon or Rehab Office at [phone]

## 2022-10-27 NOTE — Assessment & Plan Note (Signed)
Continue anticoagulation with apixaban.  ?

## 2022-10-27 NOTE — Assessment & Plan Note (Signed)
Renal function with serum cr at 1,30 with K at 4,6 and serum bicarbonate at 33. Na 139 and Mg 2,4   Plan to continue diuresis with oral furosemide and follow up renal function and electrolytes.

## 2022-10-27 NOTE — Plan of Care (Signed)
  Problem: Education: Goal: Ability to demonstrate management of disease process will improve Outcome: Progressing Goal: Ability to verbalize understanding of medication therapies will improve Outcome: Progressing Goal: Individualized Educational Video(s) Outcome: Progressing   Problem: Activity: Goal: Capacity to carry out activities will improve Outcome: Progressing   Problem: Cardiac: Goal: Ability to achieve and maintain adequate cardiopulmonary perfusion will improve Outcome: Progressing   Problem: Education: Goal: Knowledge of General Education information will improve Description: Including pain rating scale, medication(s)/side effects and non-pharmacologic comfort measures Outcome: Progressing   Problem: Health Behavior/Discharge Planning: Goal: Ability to manage health-related needs will improve Outcome: Progressing   Problem: Clinical Measurements: Goal: Ability to maintain clinical measurements within normal limits will improve Outcome: Progressing Goal: Will remain free from infection Outcome: Progressing   

## 2022-10-28 DIAGNOSIS — N179 Acute kidney failure, unspecified: Secondary | ICD-10-CM | POA: Diagnosis not present

## 2022-10-28 DIAGNOSIS — I10 Essential (primary) hypertension: Secondary | ICD-10-CM | POA: Diagnosis not present

## 2022-10-28 DIAGNOSIS — Z86718 Personal history of other venous thrombosis and embolism: Secondary | ICD-10-CM

## 2022-10-28 DIAGNOSIS — E669 Obesity, unspecified: Secondary | ICD-10-CM

## 2022-10-28 DIAGNOSIS — K529 Noninfective gastroenteritis and colitis, unspecified: Secondary | ICD-10-CM

## 2022-10-28 DIAGNOSIS — K219 Gastro-esophageal reflux disease without esophagitis: Secondary | ICD-10-CM | POA: Diagnosis not present

## 2022-10-28 DIAGNOSIS — E785 Hyperlipidemia, unspecified: Secondary | ICD-10-CM | POA: Diagnosis not present

## 2022-10-28 LAB — BASIC METABOLIC PANEL
Anion gap: 6 (ref 5–15)
BUN: 30 mg/dL — ABNORMAL HIGH (ref 8–23)
CO2: 35 mmol/L — ABNORMAL HIGH (ref 22–32)
Calcium: 8.2 mg/dL — ABNORMAL LOW (ref 8.9–10.3)
Chloride: 98 mmol/L (ref 98–111)
Creatinine, Ser: 1.43 mg/dL — ABNORMAL HIGH (ref 0.61–1.24)
GFR, Estimated: 54 mL/min — ABNORMAL LOW (ref >60)
Glucose, Bld: 123 mg/dL — ABNORMAL HIGH (ref 70–99)
Potassium: 4.6 mmol/L (ref 3.5–5.1)
Sodium: 139 mmol/L (ref 135–145)

## 2022-10-28 LAB — MAGNESIUM: Magnesium: 2.4 mg/dL (ref 1.7–2.4)

## 2022-10-28 MED ORDER — TORSEMIDE 20 MG PO TABS
60.0000 mg | ORAL_TABLET | Freq: Two times a day (BID) | ORAL | Status: DC
  Administered 2022-10-28 – 2022-11-07 (×20): 60 mg via ORAL
  Filled 2022-10-28 (×20): qty 3

## 2022-10-28 NOTE — Progress Notes (Signed)
   10/28/22 2200  BiPAP/CPAP/SIPAP  Reason BIPAP/CPAP not in use Other(comment) (Pt home unit on standby in room.)

## 2022-10-28 NOTE — Plan of Care (Signed)
  Problem: Clinical Measurements: Goal: Will remain free from infection Outcome: Completed/Met   

## 2022-10-28 NOTE — Progress Notes (Signed)
Progress Note   Patient: ELIJAH HERRON WGN:[id number] DOB: 1956/04/14 DOA: 10/16/2022     11 DOS: the patient was seen and examined on 10/28/2022   Brief hospital course: Mr. Carnahan was admitted to the hospital with the working diagnosis of acute on chronic diastolic heart failure.    67 y.o. male with medical history significant of diverticulosis, OSA, OHS, obesity, hypertension, hyperlipidemia, GERD, substance use, depression, anxiety, anemia, COPD, rare chronic respiratory failure with hypoxia, CKD 3A, diastolic CHF presenting with shortness of breath. Recent hospitalization 06/12 to 06/24 for respiratory failure due to pneumonia.   Reported worsening dyspnea and lower extremity edema for the last 7 days prior to admission. Reported he was not able to get his medications, because he had no help at home. On his initial physical examination his blood pressure was 114/67, HR 93, RR 31 and 02 saturation 93%, trach in place,  lungs with decreased breath sounds bilaterally with bilateral rales, heart with S1 and S2 present and rhythmic, abdomen with no distention and positive lower extremity edema.   Chest radiograph with hypoinflation and poor penetration, positive cardiomegaly and bilateral hilar vascular congestion.    Patient's respiratory status continues to improve somewhat over the past 48 hours, appreciate pulmonology insight recommendations and adjustment in home nebulized medications.  At this time patient is refusing to be discharged to [facility] facility/[facility] which was her previous plan.    Plan to discharge home when sable 02 requirements, down to 6 L/min per trach collar.   07/13 pending disposition.   Assessment and Plan: Acute on chronic diastolic CHF (congestive heart failure) ([name]) Echocardiogram with preserved LV systolic function 60 to 70%, mild LVH, RV systolic function preserved, no significant valvular disease. Poor windows.   -Continue diuresis with oral diuretics using  torsemide 60mg  BID -will also continue SGLT 2 inh to augment diuresis   -Following strict intake and output; continue to follow daily weights and low-sodium diet.  Acute on chronic hypoxemic respiratory failure. Continue diuresis, supplemental 02 per trach collar, currently on 10 L/min with 02 saturation 100%. Wean as tolerated to keep 02 saturation 92% or greater.    COPD (chronic obstructive pulmonary disease) ([name]) -Continue bronchodilator therapy and inhaled corticosteroids.  -Patient on chronic prednisone 10 mg daily.  -Continue trach care.  -continue PT and OT.  HTN (hypertension) -Continue blood pressure support with midodrine.  GERD without esophagitis -Continue with PPI  Morbid obesity with BMI of 50.0-59.9, adult ([name]) -Body mass index is 51.07 kg/m.  -Low-calorie diet and portion control discussed with patient.  History of DVT (deep vein thrombosis) -Continue anticoagulation with apixaban.   Iron deficiency anemia -Hgb has been stable.  -No signs of overt bleeding appreciated.  Hyperlipidemia -Continue statin therapy.  -Heart healthy diet discussed with patient.  Chronic colitis -Continue treatment with mesalamine.  -No signs of acute exacerbation.  -Patient reports no nausea, no vomiting and no abdominal pain at the moment.  AKI (acute kidney injury) ([name]) -Continue to closely follow renal function trend -Creatinine fluctuating with a creatinine in the 1.3-1.4 at the moment; patient renal function has been as high as 1.7 in the past. -Continue to maintain adequate oral hydration -Follow strict I's and O's and avoid other nephrotoxic agents.   Subjective: Weak, deconditioned, requiring 10 L oxygen supplementation through tracheostomy.  Still with signs of fluid overload on physical exam.  Physical Exam: Vitals:   10/28/22 0806 10/28/22 1113 10/28/22 1134 10/28/22 1531  BP: (!) 113/52 (!) 113/52 123/78  Pulse: 83 82 81 84  Resp: 20 20 20 20   Temp:  98.7 F (37.1 C)  98 F (36.7 C)   TempSrc: Oral  Oral   SpO2: 93% 94% 95% 92%  Weight:      Height:       General exam: Alert, awake, oriented x 3; reporting IV diuretics was working better removing fluid; no chest pain, no nausea, no vomiting.  Feeling weak and tired. Respiratory system: Decreased breath sounds at the bases; no using accessory muscles, no wheezing appreciated on exam. Cardiovascular system:RRR.  No rubs, no gallops, unable to assess JVD with body habitus. Gastrointestinal system: Abdomen is obese, nondistended, soft and nontender. No organomegaly or masses felt. Normal bowel sounds heard. Central nervous system: Alert and oriented. No focal neurological deficits. Extremities: No cyanosis or clubbing; 2-3+ edema appreciated bilaterally (chronic lymphedema present). Skin: No rashes, lesions or ulcers Psychiatry: Judgement and insight appear normal. Mood & affect appropriate.   Data Reviewed: Magnesium: 2.4 Base metabolic panel: Sodium 139, potassium 4.6, chloride 98, bicarb 35, BUN 30, creatinine 1.43 and GFR 54  Family Communication: no family at the bedside   Disposition: Status is: Inpatient Remains inpatient appropriate because: pending disposition, he has declined [facility] (reported bad experience in the past); he was amenable for other LTAC's options if any.   Planned Discharge Destination: to be determined.    Author: Vassie Loll, MD 10/28/2022 4:01 PM  For on call review [email or web address].

## 2022-10-29 DIAGNOSIS — N179 Acute kidney failure, unspecified: Secondary | ICD-10-CM | POA: Diagnosis not present

## 2022-10-29 DIAGNOSIS — E785 Hyperlipidemia, unspecified: Secondary | ICD-10-CM | POA: Diagnosis not present

## 2022-10-29 DIAGNOSIS — I10 Essential (primary) hypertension: Secondary | ICD-10-CM | POA: Diagnosis not present

## 2022-10-29 DIAGNOSIS — K219 Gastro-esophageal reflux disease without esophagitis: Secondary | ICD-10-CM | POA: Diagnosis not present

## 2022-10-29 LAB — BASIC METABOLIC PANEL
Anion gap: 7 (ref 5–15)
BUN: 30 mg/dL — ABNORMAL HIGH (ref 8–23)
CO2: 38 mmol/L — ABNORMAL HIGH (ref 22–32)
Calcium: 8 mg/dL — ABNORMAL LOW (ref 8.9–10.3)
Chloride: 93 mmol/L — ABNORMAL LOW (ref 98–111)
Creatinine, Ser: 1.86 mg/dL — ABNORMAL HIGH (ref 0.61–1.24)
GFR, Estimated: 40 mL/min — ABNORMAL LOW (ref >60)
Glucose, Bld: 143 mg/dL — ABNORMAL HIGH (ref 70–99)
Potassium: 4 mmol/L (ref 3.5–5.1)
Sodium: 138 mmol/L (ref 135–145)

## 2022-10-29 NOTE — Progress Notes (Signed)
Progress Note   Patient: Gary [name]:[id number] DOB: 09/21/1955 DOA: 10/16/2022     12 DOS: the patient was seen and examined on 10/29/2022   Brief hospital course: Gary [name] was admitted to the hospital with the working diagnosis of acute on chronic diastolic heart failure.    67 y.o. male with medical history significant of diverticulosis, OSA, OHS, obesity, hypertension, hyperlipidemia, GERD, substance use, depression, anxiety, anemia, COPD, rare chronic respiratory failure with hypoxia, CKD 3A, diastolic CHF presenting with shortness of breath. Recent hospitalization 06/12 to 06/24 for respiratory failure due to pneumonia.   Reported worsening dyspnea and lower extremity edema for the last 7 days prior to admission. Reported he was not able to get his medications, because he had no help at home. On his initial physical examination his blood pressure was 114/67, HR 93, RR 31 and 02 saturation 93%, trach in place,  lungs with decreased breath sounds bilaterally with bilateral rales, heart with S1 and S2 present and rhythmic, abdomen with no distention and positive lower extremity edema.   Chest radiograph with hypoinflation and poor penetration, positive cardiomegaly and bilateral hilar vascular congestion.    Patient's respiratory status continues to improve somewhat over the past 48 hours, appreciate pulmonology insight recommendations and adjustment in home nebulized medications.  At this time patient is refusing to be discharged to [facility] facility/[facility] which was her previous plan.    Plan to discharge home when sable 02 requirements, down to 6 L/min per trach collar.   07/13 pending disposition.   Assessment and Plan: Acute on chronic diastolic CHF (congestive heart failure) ([name]) Echocardiogram with preserved LV systolic function 60 to 70%, mild LVH, RV systolic function preserved, no significant valvular disease. Poor windows.   -Continue diuresis with oral diuretics using  torsemide 60mg  BID -will also continue SGLT 2 inh to augment diuresis   -Following strict intake and output; continue to follow daily weights and low-sodium diet.  Acute on chronic hypoxemic respiratory failure. Continue diuresis, supplemental 02 per trach collar, currently on 9 L/min with 02 saturation 100%. Wean as tolerated to keep 02 saturation 90-92% or greater.    COPD (chronic obstructive pulmonary disease) ([name]) -Continue bronchodilator therapy and inhaled corticosteroids.  -Patient on chronic prednisone 10 mg daily.  -Continue trach care.  -continue PT and OT.  HTN (hypertension) -Continue blood pressure support with midodrine.  GERD without esophagitis -Continue with PPI  Morbid obesity with BMI of 50.0-59.9, adult ([name]) -Body mass index is 50.56 kg/m.  -Low-calorie diet and portion control discussed with patient.  History of DVT (deep vein thrombosis) -Continue anticoagulation with apixaban.   Iron deficiency anemia -Hgb has been stable.  -No signs of overt bleeding appreciated.  Hyperlipidemia -Continue statin therapy.  -Heart healthy diet discussed with patient.  Chronic colitis -Continue treatment with mesalamine.  -No signs of acute exacerbation.  -Patient reports no nausea, no vomiting and no abdominal pain at the moment.  AKI (acute kidney injury) ([name]) -Continue to closely follow renal function trend -Creatinine fluctuating with a creatinine in the 1.3-1.4 at the moment; patient renal function has been as high as 1.7--1.8 in the past. -Continue to maintain adequate oral hydration -Follow strict I's and O's and avoid other nephrotoxic agents. -cr 1.86 currently   Subjective: Chronically ill and deconditioned; 9 L high flow nasal, throat tracheostomy appreciated in place.  No chest pain, no nausea, no vomiting.  Reported increasing his urine output.  Physical Exam: Vitals:   10/29/22 0815 10/29/22 1100  10/29/22 1138 10/29/22 1537  BP: 137/70  114/75  (!) 151/75  Pulse: 87 90 85 92  Resp: 18 19 18 20   Temp: 98 F (36.7 C) 98 F (36.7 C)  98.5 F (36.9 C)  TempSrc: Oral Oral  Oral  SpO2: 91%  94% 93%  Weight:      Height:       General exam: Alert, awake, oriented x 3; reports increasing his urine output; no chest pain, no nausea, no vomiting.  Breathing easier.  Using 9 L high flow nasal cannula through tracheostomy. Respiratory system: Decreased breath sounds at the bases; no using accessory muscles.  Tracheostomy in place.  No wheezing.  Positive rhonchi. Cardiovascular system:RRR. No rubs or gallops; unable to assess JVD with body habitus. Gastrointestinal system: Abdomen is obese, nondistended, soft and nontender. No organomegaly or masses felt. Normal bowel sounds heard. Central nervous system: Alert and oriented. No focal neurological deficits. Extremities: No C/C/E, +pedal pulses Skin: No petechiae.  Tracheostomy in place.  2-3+ edema appreciated bilaterally. Psychiatry: Judgement and insight appear normal. Mood & affect appropriate.   Data Reviewed: Magnesium: 2.4 Base metabolic panel: Sodium 139, potassium 4.6, chloride 98, bicarb 35, BUN 30, creatinine 1.43 and GFR 54  Family Communication: no family at the bedside   Disposition: Status is: Inpatient Remains inpatient appropriate because: pending disposition, he has declined [facility] (reported bad experience in the past); he was amenable for other LTAC's options if any.   Planned Discharge Destination: to be determined.    Author: Vassie Loll, MD 10/29/2022 5:03 PM  For on call review [email or web address].

## 2022-10-29 NOTE — Progress Notes (Signed)
Physical Therapy Treatment Patient Details Name: Gary [name] MRN: [id number] DOB: April 20, 1955 Today's Date: 10/29/2022   History of Present Illness 67 y.o. male presents to [facility] hospital on 10/16/2022 with reports of SOB and chest pain. Pt was in ED earlier this morning and elected to discharge home, however in route to home decided to return home for placement due to inadequate caregiver support. Of note, pt has had 4 admissions in the last 6 months, and 9 admissions in the last year. PMH includes obesity, hypoxia, respiratory failure with tracheostomy, COPD, GIB, HFpEF.    PT Comments  Pt initially hesitant to participate in PT, states he was left in a chair until 5:30 this am and he was upset about this. Pt agreeable to gait practice with mod encouragement, tolerating short hallways distance gait with RW and close guard for safety. Pt requires significantly elevated bed height to rise to standing at this time and does nto want to attempt stand from lower surface.      Assistance Recommended at Discharge Frequent or constant Supervision/Assistance  If plan is discharge home, recommend the following:  Can travel by private vehicle    A lot of help with walking and/or transfers;A lot of help with bathing/dressing/bathroom;Assistance with cooking/housework;Assist for transportation;Help with stairs or ramp for entrance      Equipment Recommendations  Other (comment) (pt requesting lift chair)    Recommendations for Other Services       Precautions / Restrictions Precautions Precautions: Fall Precaution Comments: trach collar (10L/40%)     Mobility  Bed Mobility Overal bed mobility: Needs Assistance Bed Mobility: Supine to Sit, Sit to Supine     Supine to sit: Supervision, HOB elevated Sit to supine: Mod assist, HOB elevated   General bed mobility comments: HOB up, assist for LEs back into bed    Transfers Overall transfer level: Needs assistance Equipment used: Rolling  walker (2 wheels) Transfers: Sit to/from Stand Sit to Stand: Min guard, From elevated surface           General transfer comment: for safety, pt declines PT assisting for fear it would make pt lose his balance    Ambulation/Gait Ambulation/Gait assistance: Min guard Gait Distance (Feet): 65 Feet Assistive device: Rolling walker (2 wheels) Gait Pattern/deviations: Step-through pattern, Wide base of support, Trunk flexed, Decreased stride length Gait velocity: decr     General Gait Details: cues for upright posture and proximity of RW   Stairs             Wheelchair Mobility     Tilt Bed    Modified Rankin (Stroke Patients Only)       Balance Overall balance assessment: Needs assistance Sitting-balance support: No upper extremity supported, Feet supported Sitting balance-[name] Scale: Fair Sitting balance - Comments: sitting EOB   Standing balance support: During functional activity, Bilateral upper extremity supported, Reliant on assistive device for balance Standing balance-[name] Scale: Poor Standing balance comment: with RW support                            Cognition Arousal/Alertness: Awake/alert Behavior During Therapy: WFL for tasks assessed/performed Overall Cognitive Status: Within Functional Limits for tasks assessed                                 General Comments: lacks insight into deficits, self-limiting at times, particular  Exercises General Exercises - Lower Extremity Long Arc Quad: AROM, Both, 5 reps, Seated    General Comments General comments (skin integrity, edema, etc.): SpO2 95% post-gait, 10L/40%      Pertinent Vitals/Pain Pain Assessment Pain Assessment: Faces Faces Pain Scale: No hurt Pain Intervention(s): Monitored during session    Home Living                          Prior Function            PT Goals (current goals can now be found in the care plan section) Acute  Rehab PT Goals Patient Stated Goal: get stronger and go home PT Goal Formulation: With patient Time For Goal Achievement: 10/31/22 Potential to Achieve Goals: Fair Progress towards PT goals: Progressing toward goals    Frequency    Min 1X/week      PT Plan Current plan remains appropriate    Co-evaluation              AM-PAC PT "6 Clicks" Mobility   Outcome Measure  Help needed turning from your back to your side while in a flat bed without using bedrails?: A Little Help needed moving from lying on your back to sitting on the side of a flat bed without using bedrails?: A Little Help needed moving to and from a bed to a chair (including a wheelchair)?: A Little Help needed standing up from a chair using your arms (e.g., wheelchair or bedside chair)?: A Lot Help needed to walk in hospital room?: A Little Help needed climbing 3-5 steps with a railing? : Total 6 Click Score: 15    End of Session Equipment Utilized During Treatment: Oxygen Activity Tolerance: Patient limited by fatigue Patient left: with call bell/phone within reach;in bed;with bed alarm set Nurse Communication: Mobility status PT Visit Diagnosis: Muscle weakness (generalized) (M62.81);Difficulty in walking, not elsewhere classified (R26.2)     Time: 1455-1520 PT Time Calculation (min) (ACUTE ONLY): 25 min  Charges:    $Gait Training: 8-22 mins $Therapeutic Activity: 8-22 mins PT General Charges $$ ACUTE PT VISIT: 1 Visit                     Marye Round, PT DPT Acute Rehabilitation Services Secure Chat Preferred  Office [phone]    [name] E [name] 10/29/2022, 4:16 PM

## 2022-10-30 DIAGNOSIS — I10 Essential (primary) hypertension: Secondary | ICD-10-CM | POA: Diagnosis not present

## 2022-10-30 DIAGNOSIS — E785 Hyperlipidemia, unspecified: Secondary | ICD-10-CM | POA: Diagnosis not present

## 2022-10-30 DIAGNOSIS — N179 Acute kidney failure, unspecified: Secondary | ICD-10-CM | POA: Diagnosis not present

## 2022-10-30 DIAGNOSIS — K219 Gastro-esophageal reflux disease without esophagitis: Secondary | ICD-10-CM | POA: Diagnosis not present

## 2022-10-30 NOTE — Progress Notes (Signed)
   10/29/22 2245  BiPAP/CPAP/SIPAP  BiPAP/CPAP/SIPAP Pt Type Adult (Home unit)  BiPAP/CPAP/SIPAP  (Astral home unit)  Mask Type  (trach)  Set Rate 2 breaths/min  IPAP 10 cmH20  EPAP 5 cmH2O  Flow Rate 6 lpm (bled in)  BiPAP/CPAP /SiPAP Vitals  SpO2 94 %

## 2022-10-30 NOTE — Plan of Care (Signed)
?  Problem: Activity: ?Goal: Capacity to carry out activities will improve ?Outcome: Progressing ?  ?

## 2022-10-30 NOTE — Progress Notes (Signed)
Mobility Specialist: Progress Note    10/30/22 1154  Mobility  Activity Ambulated with assistance in hallway  Level of Assistance Contact guard assist, steadying assist  Assistive Device Front wheel walker (with chair follow)  Distance Ambulated (ft) 200 ft  Activity Response Tolerated well  Mobility Referral Yes  $Mobility charge 1 Mobility  Mobility Specialist Start Time (ACUTE ONLY) 1100  Mobility Specialist Stop Time (ACUTE ONLY) 1130  Mobility Specialist Time Calculation (min) (ACUTE ONLY) 30 min    Post-Mobility: SpO2 95% 10LO2 trach   Pt requested mobility. Had no c/o throughout session; however, had to sit in the chair d/t SOB and fatigue. Pt was rolled back to room and left in chair with all needs met.  Maurene Capes Mobility Specialist Please contact via SecureChat or Rehab office at [phone]

## 2022-10-30 NOTE — TOC Progression Note (Addendum)
Transition of Care [facility]) - Progression Note    Patient Details  Name: Gary [name] MRN: [id number] Date of Birth: 18-Dec-1955  Transition of Care [facility]) CM/SW Contact  [name], RN Phone Number: 10/30/2022, 3:41 PM  Clinical Narrative:    NCM is checking to see what other [facility] agency can take an chronic trach patient.  Awaiting call backs. Also patient's concentrator at home goes up to 10 liters per [place] with [employer].  He also has an invasive vent at home.    Expected Discharge Plan: Skilled Nursing Facility Barriers to Discharge: Continued Medical Work up  Expected Discharge Plan and Services       Living arrangements for the past 2 months: Single Family Home                           HH Arranged: PT, RN (Resumption of care) HH Agency: [facility] Date [facility] Agency Contacted: 10/19/22 Time HH Agency Contacted: 1320 Representative spoke with at [facility] Agency: [name]   Social Determinants of Health (SDOH) Interventions SDOH Screenings   Food Insecurity: No Food Insecurity (10/18/2022)  Housing: Patient Declined (10/18/2022)  Transportation Needs: No Transportation Needs (10/18/2022)  Recent Concern: Transportation Needs - Unmet Transportation Needs (08/13/2022)  Utilities: Not At Risk (10/18/2022)  Depression (PHQ2-9): Low Risk  (06/07/2021)  Financial Resource Strain: Medium Risk (05/03/2017)  Physical Activity: Insufficiently Active (05/03/2017)  Social Connections: Unknown (08/06/2022)   Received from [facility]  Stress: No Stress Concern Present (05/03/2017)  Tobacco Use: Medium Risk (10/16/2022)    Readmission Risk Interventions    12/31/2021    8:58 AM 12/20/2021    4:21 PM 05/04/2021    1:26 PM  Readmission Risk Prevention Plan  Transportation Screening Complete Complete Complete  Medication Review Oceanographer) Complete Complete Complete  PCP or Specialist appointment within 3-5 days of discharge Complete Complete Complete  HRI or Home Care  Consult Complete Complete Complete  SW Recovery Care/Counseling Consult Complete Complete Complete  Palliative Care Screening Complete Complete Not Applicable  Skilled Nursing Facility Not Applicable Not Applicable Not Applicable

## 2022-10-30 NOTE — Plan of Care (Signed)

## 2022-10-30 NOTE — Progress Notes (Signed)
Progress Note   Patient: Gary [name] QMV:[id number] DOB: 30-Mar-1956 DOA: 10/16/2022     13 DOS: the patient was seen and examined on 10/30/2022   Brief hospital course: Mr. Porcaro was admitted to the hospital with the working diagnosis of acute on chronic diastolic heart failure.    67 y.o. male with medical history significant of diverticulosis, OSA, OHS, obesity, hypertension, hyperlipidemia, GERD, substance use, depression, anxiety, anemia, COPD, rare chronic respiratory failure with hypoxia, CKD 3A, diastolic CHF presenting with shortness of breath. Recent hospitalization 06/12 to 06/24 for respiratory failure due to pneumonia.   Reported worsening dyspnea and lower extremity edema for the last 7 days prior to admission. Reported he was not able to get his medications, because he had no help at home. On his initial physical examination his blood pressure was 114/67, HR 93, RR 31 and 02 saturation 93%, trach in place,  lungs with decreased breath sounds bilaterally with bilateral rales, heart with S1 and S2 present and rhythmic, abdomen with no distention and positive lower extremity edema.   Chest radiograph with hypoinflation and poor penetration, positive cardiomegaly and bilateral hilar vascular congestion.    Patient's respiratory status continues to improve somewhat over the past 48 hours, appreciate pulmonology insight recommendations and adjustment in home nebulized medications.  At this time patient is refusing to be discharged to [facility] facility/LTAC which was her previous plan.    Plan to discharge home when sable 02 requirements, down to 6 L/min per trach collar.   07/16 pending disposition.  Decreasing oxygen supplementation and working along with [facility] for discharge purposes.  Heparin medically stable at the moment.  In no acute distress.  Assessment and Plan: Acute on chronic diastolic CHF (congestive heart failure) ([name]) Echocardiogram with preserved LV systolic function 60  to 70%, mild LVH, RV systolic function preserved, no significant valvular disease. Poor windows.   -Continue diuresis with oral diuretics using torsemide 60mg  BID -will also continue SGLT 2 inh to augment diuresis   -Following strict intake and output; continue to follow daily weights and low-sodium diet.  Acute on chronic hypoxemic respiratory failure. Continue diuresis, supplemental 02 per trach collar, currently on 8-10 L/min with 02 saturation mid 90%.  Wean as tolerated to keep 02 saturation > 89% or greater; looking for supplementation around 6 L for discharge purposes.  COPD (chronic obstructive pulmonary disease) ([name]) -Continue bronchodilator therapy and inhaled corticosteroids.  -Patient on chronic prednisone 10 mg daily.  -Continue trach care.  -continue PT and OT.  HTN (hypertension) -Continue blood pressure support with midodrine. -Continue to follow vital signs.  GERD without esophagitis -Continue with PPI -Lifestyle changes discussed with patient.  Morbid obesity with BMI of 50.0-59.9, adult ([name]) -Body mass index is 50.56 kg/m.  -Low-calorie diet and portion control discussed with patient.  History of DVT (deep vein thrombosis) -Continue anticoagulation with apixaban.  -No signs of overt bleeding. -Reports no lower extremity pain currently.  Iron deficiency anemia -Hgb has been stable.  -No signs of overt bleeding appreciated.  Hyperlipidemia -Continue statin therapy.  -Heart healthy diet discussed with patient.  Chronic colitis -Continue treatment with mesalamine.  -No signs of acute exacerbation.  -Patient reports no nausea, no vomiting and no abdominal pain at the moment. -Tolerating diet without problems.  AKI (acute kidney injury) ([name]) -Continue to closely follow renal function trend -Creatinine fluctuating with a creatinine in the 1.3-1.4 at the moment; patient renal function has been as high as 1.7--1.8 in the past. -Continue to  maintain  adequate oral hydration -Continue to avoid other nephrotoxic agents. -Latest cr 1.86  -Repeat basic metabolic panel in a.m. -Continue to follow a strict I's and O's and urine output.  Subjective: Chronically ill in appearance; weak and deconditioned.  Morbidly obese.  Requiring high flow nasal cannula through tracheostomy (supplementation fluctuating between 8 and 10 L).  Patient denies chest pain, nausea, vomiting, abdominal pain plaints.  Physical Exam: Vitals:   10/29/22 1537 10/29/22 1944 10/29/22 2222 10/29/22 2245  BP: (!) 151/75 (!) 138/56    Pulse: 92 85    Resp: 20 20    Temp: 98.5 F (36.9 C) 98.5 F (36.9 C)    TempSrc: Oral Oral    SpO2: 93% 94% 94% 94%  Weight:      Height:       General exam: Alert, awake, oriented x 3; no chest pain, no nausea, no vomiting.  Reports breathing is stable. Respiratory system: Decreased breath sounds at the bases; no using accessory muscle.  Tracheostomy in place.  Not increase secretions appreciated. Cardiovascular system: Rate controlled, no rubs, no gallops, unable to assess JVD with body habitus. Gastrointestinal system: Abdomen is obese, nondistended, soft and nontender. No organomegaly or masses felt. Normal bowel sounds heard. Central nervous system: No focal neurological deficits. Extremities: No cyanosis or clubbing; 2-3+ edema appreciated bilaterally. Skin: No petechiae. Psychiatry: Judgement and insight appear normal. Mood & affect appropriate.   Latest Data Reviewed: Magnesium: 2.4 Base metabolic panel: Sodium 139, potassium 4.6, chloride 98, bicarb 35, BUN 30, creatinine 1.43 and GFR 54  Family Communication: no family at the bedside   Disposition: Status is: Inpatient Remains inpatient appropriate because: pending disposition, he has declined [facility] (reported bad experience in the past); he was amenable for other LTAC's options if any.   Planned Discharge Destination: to be determined.    Author: Vassie Loll, MD 10/30/2022 8:55 AM  For on call review [email or web address].

## 2022-10-31 DIAGNOSIS — J9622 Acute and chronic respiratory failure with hypercapnia: Secondary | ICD-10-CM

## 2022-10-31 LAB — CBC
HCT: 40.2 % (ref 39.0–52.0)
Hemoglobin: 10.5 g/dL — ABNORMAL LOW (ref 13.0–17.0)
MCH: 21 pg — ABNORMAL LOW (ref 26.0–34.0)
MCHC: 26.1 g/dL — ABNORMAL LOW (ref 30.0–36.0)
MCV: 80.4 fL (ref 80.0–100.0)
Platelets: 226 10*3/uL (ref 150–400)
RBC: 5 MIL/uL (ref 4.22–5.81)
RDW: 18.8 % — ABNORMAL HIGH (ref 11.5–15.5)
WBC: 7.4 10*3/uL (ref 4.0–10.5)
nRBC: 1.1 % — ABNORMAL HIGH (ref 0.0–0.2)

## 2022-10-31 LAB — BASIC METABOLIC PANEL
Anion gap: 9 (ref 5–15)
BUN: 35 mg/dL — ABNORMAL HIGH (ref 8–23)
CO2: 37 mmol/L — ABNORMAL HIGH (ref 22–32)
Calcium: 8.3 mg/dL — ABNORMAL LOW (ref 8.9–10.3)
Chloride: 93 mmol/L — ABNORMAL LOW (ref 98–111)
Creatinine, Ser: 1.37 mg/dL — ABNORMAL HIGH (ref 0.61–1.24)
GFR, Estimated: 57 mL/min — ABNORMAL LOW (ref >60)
Glucose, Bld: 109 mg/dL — ABNORMAL HIGH (ref 70–99)
Potassium: 3.3 mmol/L — ABNORMAL LOW (ref 3.5–5.1)
Sodium: 139 mmol/L (ref 135–145)

## 2022-10-31 NOTE — Progress Notes (Signed)
Progress Note   Patient: Gary [name] ZOX:[id number] DOB: November 24, 1955 DOA: 10/16/2022     14 DOS: the patient was seen and examined on 10/31/2022   Brief hospital course: Patient is a 67 year old male, morbidly obese, past medical history significant for diverticulosis, OSA, OHS, hypertension, hyperlipidemia, GERD, substance use, depression, anxiety, anemia, COPD, rare chronic respiratory failure with hypoxia, CKD 3A, diastolic CHF presenting with shortness of breath. Recent hospitalization 06/12 to 06/24 for respiratory failure due to pneumonia.   Reported worsening dyspnea and lower extremity edema for the last 7 days prior to admission. Reported he was not able to get his medications, because he had no help at home. On his initial physical examination his blood pressure was 114/67, HR 93, RR 31 and 02 saturation 93%, trach in place,  lungs with decreased breath sounds bilaterally with bilateral rales, heart with S1 and S2 present and rhythmic, abdomen with no distention and positive lower extremity edema.   Chest radiograph with hypoinflation and poor penetration, positive cardiomegaly and bilateral hilar vascular congestion.    Patient's respiratory status continues to improve somewhat over the past 48 hours, appreciate pulmonology insight recommendations and adjustment in home nebulized medications.  At this time patient is refusing to be discharged to [facility] facility/[facility] which was her previous plan.    Plan to discharge home when sable 02 requirements, down to 6 L/min per trach collar.   07/16 pending disposition.  Decreasing oxygen supplementation and working along with [facility] for discharge purposes.  Heparin medically stable at the moment.  In no acute distress.  717: Patient seen.  No new complaints.  Patient is awaiting disposition.  Patient is not keen on being discharged to [facility] facility.  Assessment and Plan: Acute on chronic diastolic CHF (congestive heart failure)  ([name]) Echocardiogram with preserved LV systolic function 60 to 70%, mild LVH, RV systolic function preserved, no significant valvular disease. Poor windows.  -Continue diuresis with oral diuretics using torsemide 60mg  BID -will also continue SGLT 2 inh to augment diuresis   -Following strict intake and output; continue to follow daily weights and low-sodium diet. 10/31/2022: Pursue disposition.  Acute on chronic hypoxemic respiratory failure. Continue diuresis, supplemental 02 per trach collar, currently on 8-10 L/min with 02 saturation mid 90%. 10/31/2022: Patient has trach that is capped.  Continue to wean supplemental oxygen as tolerated.  COPD (chronic obstructive pulmonary disease) ([name]) -Continue bronchodilator therapy and inhaled corticosteroids.  -Patient on chronic prednisone 10 mg daily.  -Continue trach care.  -continue PT and OT.  HTN (hypertension) -Continue blood pressure support with midodrine. -Continue to follow vital signs.  GERD without esophagitis -Continue with PPI -Lifestyle changes discussed with patient.  Morbid obesity with BMI of 50.0-59.9, adult ([name]) -Body mass index is 48.93 kg/m.  -Low-calorie diet and portion control discussed with patient.  History of DVT (deep vein thrombosis) -Continue anticoagulation with apixaban.  -No signs of overt bleeding. -Reports no lower extremity pain currently.  Iron deficiency anemia -Hgb has been stable.  -No signs of overt bleeding appreciated.  Hyperlipidemia -Continue statin therapy.  -Heart healthy diet discussed with patient.  Chronic colitis -Continue treatment with mesalamine.  -No signs of acute exacerbation.  -Patient reports no nausea, no vomiting and no abdominal pain at the moment. -Tolerating diet without problems.  AKI (acute kidney injury) ([name]) -Continue to closely follow renal function trend -Creatinine fluctuating with a creatinine in the 1.3-1.4 at the moment; patient renal function has  been as high as 1.7--1.8 in  the past. -Continue to maintain adequate oral hydration -Continue to avoid other nephrotoxic agents. -Latest cr 1.86  -Repeat basic metabolic panel in a.m. -Continue to follow a strict I's and O's and urine output.  Subjective: -No new changes. -No new symptoms endorsed.[name]  Physical Exam: Vitals:   10/31/22 0608 10/31/22 0741 10/31/22 0743 10/31/22 0808  BP: (!) 142/98   139/89  Pulse:  91  93  Resp: 19 18  18   Temp: 98 F (36.7 C)   98.4 F (36.9 C)  TempSrc: Oral   Oral  SpO2:  93% 93% 93%  Weight: (!) 182.3 kg     Height:       General exam: Patient is morbidly obese.  Patient is s/p tracheostomy.  The trach is capped.  [name] Respiratory system: Decreased breath sounds  Cardiovascular system: S1-S2.   Gastrointestinal system: Abdomen is obese Central nervous system: Awake and alert. Extremities: Bilateral lower extremity edema.   Family Communication: no family at the bedside   Disposition: Status is: Inpatient Remains inpatient appropriate because: pending disposition, he has declined [facility] (reported bad experience in the past); he was amenable for other LTAC's options if any.   Planned Discharge Destination: to be determined.    Author: Barnetta Chapel, MD 10/31/2022 10:31 AM  For on call review [email or web address].

## 2022-10-31 NOTE — TOC Progression Note (Signed)
Transition of Care [facility]) - Progression Note    Patient Details  Name: Gary [name] MRN: [id number] Date of Birth: January 02, 1956  Transition of Care [facility]) CM/SW Contact  [name], RN Phone Number: 10/31/2022, 1:01 PM  Clinical Narrative:    NCM made referral to Amy with [facility], she is able to take referral for HHPT, HHOT, Patient does his own trach care, which he has had the trach since June 2023. Patient states he does not need HHRN, he just needs the therapy.  Also he gets his meds from  [facility] in [place], they are closed on the weekend so he will need to get his meds before the weekend.   Expected Discharge Plan: Home w Home Health Services Barriers to Discharge: Continued Medical Work up  Expected Discharge Plan and Services In-house Referral: Clinical Social Work Discharge Planning Services: CM Consult Post Acute Care Choice: Home Health Living arrangements for the past 2 months: Single Family Home                 DME Arranged: N/A DME Agency: NA       HH Arranged: PT, OT HH Agency: [facility] Date HH Agency Contacted: 10/31/22 Time HH Agency Contacted: 1300 Representative spoke with at [facility] Agency: Amy   Social Determinants of Health (SDOH) Interventions SDOH Screenings   Food Insecurity: No Food Insecurity (10/18/2022)  Housing: Patient Declined (10/18/2022)  Transportation Needs: No Transportation Needs (10/18/2022)  Recent Concern: Transportation Needs - Unmet Transportation Needs (08/13/2022)  Utilities: Not At Risk (10/18/2022)  Depression (PHQ2-9): Low Risk  (06/07/2021)  Financial Resource Strain: Medium Risk (05/03/2017)  Physical Activity: Insufficiently Active (05/03/2017)  Social Connections: Unknown (08/06/2022)   Received from [facility]  Stress: No Stress Concern Present (05/03/2017)  Tobacco Use: Medium Risk (10/16/2022)    Readmission Risk Interventions    12/31/2021    8:58 AM 12/20/2021    4:21 PM 05/04/2021     1:26 PM  Readmission Risk Prevention Plan  Transportation Screening Complete Complete Complete  Medication Review Oceanographer) Complete Complete Complete  PCP or Specialist appointment within 3-5 days of discharge Complete Complete Complete  HRI or Home Care Consult Complete Complete Complete  SW Recovery Care/Counseling Consult Complete Complete Complete  Palliative Care Screening Complete Complete Not Applicable  Skilled Nursing Facility Not Applicable Not Applicable Not Applicable

## 2022-10-31 NOTE — Progress Notes (Signed)
   10/31/22 0102  BiPAP/CPAP/SIPAP  Reason BIPAP/CPAP not in use Non-compliant   Refused, said he is not going to wear tonight, he wants to sleep in the chair and his back hurts.

## 2022-10-31 NOTE — Progress Notes (Signed)
Physical Therapy Treatment Patient Details Name: Gary [name] MRN: [id number] DOB: [birth year] Today's Date: 10/31/2022   History of Present Illness 67 y.o. male presents to [facility] hospital on 10/16/2022 with reports of SOB and chest pain. Pt was in ED earlier this morning and elected to discharge home, however in route to home decided to return home for placement due to inadequate caregiver support. Of note, pt has had 4 admissions in the last 6 months, and 9 admissions in the last year. PMH includes obesity, hypoxia, respiratory failure with tracheostomy, COPD, GIB, HFpEF.    PT Comments  Patient pushing himself for ambulation distance today and eager to mobilize requesting nursing assistance prior to PT arrival.  Has very particular way he prefers to do things (I.e to use bed pan instead of BSC since he feels he cannot stand from [facility], making sure he has on gloves so his sweaty hands don't slip off walker, etc.)  Seems the plan is now for home with [name] therapy services.  Not sure other resources for assistance, but would benefit also from [name] and some help to get a lift chair.  PT will continue to follow in the acute setting.  Goals updated this session.     Assistance Recommended at Discharge Frequent or constant Supervision/Assistance  If plan is discharge home, recommend the following:  Can travel by private vehicle    A lot of help with walking and/or transfers;A lot of help with bathing/dressing/bathroom;Assistance with cooking/housework;Assist for transportation;Help with stairs or ramp for entrance   No  Equipment Recommendations  Other (comment) (requesting lift chair)    Recommendations for Other Services       Precautions / Restrictions Precautions Precautions: Fall Precaution Comments: trach collar (10L/40%) Restrictions Weight Bearing Restrictions: No     Mobility  Bed Mobility Overal bed mobility: Needs Assistance   Rolling: Modified independent  (Device/Increase time)   Supine to sit: Min guard     General bed mobility comments: using rails to pull up, assist for balance    Transfers Overall transfer level: Needs assistance Equipment used: Rolling walker (2 wheels) Transfers: Sit to/from Stand Sit to Stand: Min guard, From elevated surface           General transfer comment: significantly higher surface to stand per pt request    Ambulation/Gait Ambulation/Gait assistance: Min guard, +2 safety/equipment Gait Distance (Feet): 150 Feet Assistive device: Rolling walker (2 wheels) Gait Pattern/deviations: Step-through pattern, Wide base of support, Decreased stride length       General Gait Details: chair close for safety, maintained on 40% FiO2 on 10L via portable tank on trach collar.  one standing rest at nursing station prior to turning and walking halfway back toward room   Stairs             Wheelchair Mobility     Tilt Bed    Modified Rankin (Stroke Patients Only)       Balance Overall balance assessment: Needs assistance Sitting-balance support: Feet supported Sitting balance-[name] Scale: Good     Standing balance support: Bilateral upper extremity supported Standing balance-[name] Scale: Poor Standing balance comment: UE support for balance                            Cognition Arousal/Alertness: Awake/alert Behavior During Therapy: WFL for tasks assessed/performed Overall Cognitive Status: Within Functional Limits for tasks assessed  General Comments: lacks insight into deficits, self-limiting at times, particular        Exercises      General Comments General comments (skin integrity, edema, etc.): SpO2 98% wtih ambulation 40% trach collar; toileted on bedpan with large BM assist for hygiene, then pt wiped himself as well      Pertinent Vitals/Pain Pain Assessment Faces Pain Scale: No hurt    Home Living                           Prior Function            PT Goals (current goals can now be found in the care plan section) Acute Rehab PT Goals Patient Stated Goal: get stronger and go home PT Goal Formulation: With patient Time For Goal Achievement: 11/14/22 Potential to Achieve Goals: Good Progress towards PT goals: Goals met and updated - see care plan    Frequency    Min 1X/week      PT Plan Current plan remains appropriate    Co-evaluation              AM-PAC PT "6 Clicks" Mobility   Outcome Measure  Help needed turning from your back to your side while in a flat bed without using bedrails?: A Little Help needed moving from lying on your back to sitting on the side of a flat bed without using bedrails?: A Little Help needed moving to and from a bed to a chair (including a wheelchair)?: A Little Help needed standing up from a chair using your arms (e.g., wheelchair or bedside chair)?: A Little Help needed to walk in hospital room?: A Little Help needed climbing 3-5 steps with a railing? : Total 6 Click Score: 16    End of Session Equipment Utilized During Treatment: Oxygen Activity Tolerance: Patient tolerated treatment well Patient left: in chair;with chair alarm set;with call bell/phone within reach   PT Visit Diagnosis: Muscle weakness (generalized) (M62.81);Difficulty in walking, not elsewhere classified (R26.2)     Time: 1103-1140 PT Time Calculation (min) (ACUTE ONLY): 37 min  Charges:    $Gait Training: 8-22 mins $Therapeutic Activity: 8-22 mins PT General Charges $$ ACUTE PT VISIT: 1 Visit                     Gary [name], PT Acute Rehabilitation Services [phone] 10/31/2022    Gary [name] 10/31/2022, 1:41 PM

## 2022-10-31 NOTE — Plan of Care (Signed)
?  Problem: Activity: ?Goal: Capacity to carry out activities will improve ?Outcome: Progressing ?  ?

## 2022-11-01 DIAGNOSIS — J9620 Acute and chronic respiratory failure, unspecified whether with hypoxia or hypercapnia: Secondary | ICD-10-CM

## 2022-11-01 LAB — RENAL FUNCTION PANEL
Albumin: 2.9 g/dL — ABNORMAL LOW (ref 3.5–5.0)
Anion gap: 7 (ref 5–15)
BUN: 35 mg/dL — ABNORMAL HIGH (ref 8–23)
CO2: 36 mmol/L — ABNORMAL HIGH (ref 22–32)
Calcium: 8.1 mg/dL — ABNORMAL LOW (ref 8.9–10.3)
Chloride: 92 mmol/L — ABNORMAL LOW (ref 98–111)
Creatinine, Ser: 1.27 mg/dL — ABNORMAL HIGH (ref 0.61–1.24)
GFR, Estimated: >60 mL/min (ref >60)
Glucose, Bld: 176 mg/dL — ABNORMAL HIGH (ref 70–99)
Phosphorus: 3.9 mg/dL (ref 2.5–4.6)
Potassium: 3 mmol/L — ABNORMAL LOW (ref 3.5–5.1)
Sodium: 135 mmol/L (ref 135–145)

## 2022-11-01 LAB — MAGNESIUM: Magnesium: 2.3 mg/dL (ref 1.7–2.4)

## 2022-11-01 MED ORDER — POTASSIUM CHLORIDE CRYS ER 20 MEQ PO TBCR
40.0000 meq | EXTENDED_RELEASE_TABLET | ORAL | Status: AC
  Administered 2022-11-01 (×2): 40 meq via ORAL
  Filled 2022-11-01 (×2): qty 2

## 2022-11-01 NOTE — Progress Notes (Signed)
   10/31/22 2300  BiPAP/CPAP/SIPAP  $ Non-Invasive Home Ventilator  Subsequent  BiPAP/CPAP/SIPAP Pt Type Adult  BiPAP/CPAP/SIPAP Resmed  Set Rate  (Home Settings)  Respiratory Rate 22 breaths/min  FiO2 (%) 35 %  Patient Home Equipment Yes  CPAP/SIPAP surface wiped down Yes  Safety Check Completed by RT for Home Unit Yes, no issues noted  BiPAP/CPAP /SiPAP Vitals  SpO2 97 %

## 2022-11-01 NOTE — Progress Notes (Signed)
Progress Note   Patient: Gary [name] XBJ:[id number] DOB: 11-25-1955 DOA: 10/16/2022     15 DOS: the patient was seen and examined on 11/01/2022   Brief hospital course: Patient is a 67 year old male, morbidly obese, past medical history significant for diverticulosis, OSA, OHS, hypertension, hyperlipidemia, GERD, substance use, depression, anxiety, anemia, COPD, rare chronic respiratory failure with hypoxia, CKD 3A, diastolic CHF presenting with shortness of breath. Recent hospitalization 06/12 to 06/24 for respiratory failure due to pneumonia.   Reported worsening dyspnea and lower extremity edema for the last 7 days prior to admission. Reported he was not able to get his medications, because he had no help at home. On his initial physical examination his blood pressure was 114/67, HR 93, RR 31 and 02 saturation 93%, trach in place,  lungs with decreased breath sounds bilaterally with bilateral rales, heart with S1 and S2 present and rhythmic, abdomen with no distention and positive lower extremity edema.   Chest radiograph with hypoinflation and poor penetration, positive cardiomegaly and bilateral hilar vascular congestion.    Patient's respiratory status continues to improve somewhat over the past 48 hours, appreciate pulmonology insight recommendations and adjustment in home nebulized medications.  At this time patient is refusing to be discharged to [facility] facility/LTAC which was her previous plan.    Plan to discharge home when sable 02 requirements, down to 6 L/min per trach collar.   07/16 pending disposition.  Decreasing oxygen supplementation and working along with [facility] for discharge purposes.  Heparin medically stable at the moment.  In no acute distress.  10/31/2022: Patient seen.  No new complaints.  Patient is awaiting disposition.  Patient is not keen on being discharged to [facility] facility.  11/01/2022: Patient seen.  Patient remained stable.  Patient is on 7 L of  supplemental oxygen.  Patient tells me that at baseline, he is on 6 L of supplemental oxygen.  Palliative care team has been consulted for goals of care can be discussed.  Likely discharge back home with home health.  For family meeting on Monday, 11/05/2022.  Input from the case management team is highly appreciated..    Assessment and Plan: Acute on chronic diastolic CHF (congestive heart failure) ([name]) Echocardiogram with preserved LV systolic function 60 to 70%, mild LVH, RV systolic function preserved, no significant valvular disease. Poor windows.  -Continue diuresis with oral diuretics using torsemide 60mg  BID -will also continue SGLT 2 inh to augment diuresis   -Following strict intake and output; continue to follow daily weights and low-sodium diet. 10/31/2022: Pursue disposition. 11/01/2022: Stable.  Continue current management.  Acute on chronic hypoxemic respiratory failure. Continue diuresis, supplemental 02 per trach collar, currently on 8-10 L/min with 02 saturation mid 90%. 10/31/2022: Patient has trach that is capped.  Continue to wean supplemental oxygen as tolerated. 11/02/2022: Stable.  Patient is currently on 7 L of supplemental oxygen.  At baseline, patient is on 6 L of supplemental oxygen.  COPD (chronic obstructive pulmonary disease) ([name]) -Continue bronchodilator therapy and inhaled corticosteroids.  -Patient on chronic prednisone 10 mg daily.  -Continue trach care.  -continue PT and OT.  HTN (hypertension) -Continue blood pressure support with midodrine. -Continue to follow vital signs.  GERD without esophagitis -Continue with PPI -Lifestyle changes discussed with patient.  Morbid obesity with BMI of 50.0-59.9, adult ([name]) -Body mass index is 49.3 kg/m.  -Low-calorie diet and portion control discussed with patient.  History of DVT (deep vein thrombosis) -Continue anticoagulation with apixaban.  -No signs  of overt bleeding. -Reports no lower extremity pain  currently.  Iron deficiency anemia -Hgb has been stable.  -No signs of overt bleeding appreciated.  Hyperlipidemia -Continue statin therapy.  -Heart healthy diet discussed with patient.  Chronic colitis -Continue treatment with mesalamine.  -No signs of acute exacerbation.  -Patient reports no nausea, no vomiting and no abdominal pain at the moment. -Tolerating diet without problems.  AKI (acute kidney injury) ([name]) -Continue to closely follow renal function trend -Creatinine fluctuating with a creatinine in the 1.3-1.4 at the moment; patient renal function has been as high as 1.7--1.8 in the past. -Continue to maintain adequate oral hydration -Continue to avoid other nephrotoxic agents. -Latest cr 1.86  -Repeat basic metabolic panel in a.m. -Continue to follow a strict I's and O's and urine output.  Subjective: -No new changes. -No new symptoms endorsed.[name]  Physical Exam: Vitals:   11/01/22 1100 11/01/22 1142 11/01/22 1510 11/01/22 1534  BP: 139/86  (!) 157/82   Pulse: 84 83 60 (!) 109  Resp: 20 17 18 20   Temp: 98.2 F (36.8 C)  98.4 F (36.9 C)   TempSrc: Oral  Oral   SpO2: 94% 94% 95% 90%  Weight:      Height:       General exam: Patient is morbidly obese.  Patient is s/p tracheostomy.  The trach is capped.  [name] Respiratory system: Decreased breath sounds  Cardiovascular system: S1-S2.   Gastrointestinal system: Abdomen is obese Central nervous system: Awake and alert. Extremities: Bilateral lower extremity edema.   Family Communication:    Disposition: Status is: Inpatient Remains inpatient appropriate because: pending disposition, he has declined [facility] (reported bad experience in the past); he was amenable for other LTAC's options if any.   Planned Discharge Destination: to be determined.    Author: Barnetta Chapel, MD 11/01/2022 6:49 PM  For on call review [email or web address].

## 2022-11-01 NOTE — Plan of Care (Signed)
?  Problem: Activity: ?Goal: Capacity to carry out activities will improve ?Outcome: Progressing ?  ?

## 2022-11-01 NOTE — Progress Notes (Signed)
Occupational Therapy Treatment Patient Details Name: Gary [name] MRN: [id number] DOB: Jun 04, 1955 Today's Date: 11/01/2022   History of present illness 67 y.o. male presents to [facility] hospital on 10/16/2022 with reports of SOB and chest pain. Pt was in ED earlier this morning and elected to discharge home, however in route to home decided to return home for placement due to inadequate caregiver support. Of note, pt has had 4 admissions in the last 6 months, and 9 admissions in the last year. PMH includes obesity, hypoxia, respiratory failure with tracheostomy, COPD, GIB, HFpEF.   OT comments  Pt pleasant and willing to work with OT on ADL training. Min to set up for UB bathing and dressing, total assist for LB. Pt has a Sports administrator at home, educated pt to use for LB dressing. Typically wears slip on shoes during the summer. Could benefit from long handled bath sponge. Plans to rely on girlfriend for pericare, educated in availability of toilet tongs. Pt requesting bed heightened excessively, but then able to stand with RW and min guard assist.    Recommendations for follow up therapy are one component of a multi-disciplinary discharge planning process, led by the attending physician.  Recommendations may be updated based on patient status, additional functional criteria and insurance authorization.    Assistance Recommended at Discharge Frequent or constant Supervision/Assistance  Patient can return home with the following  Two people to help with walking and/or transfers;A lot of help with bathing/dressing/bathroom;Assistance with cooking/housework;Direct supervision/assist for financial management;Assist for transportation;Help with stairs or ramp for entrance   Equipment Recommendations  None recommended by OT    Recommendations for Other Services      Precautions / Restrictions Precautions Precautions: Fall Precaution Comments: trach collar (6L/40%) Restrictions Weight Bearing Restrictions: No        Mobility Bed Mobility Overal bed mobility: Needs Assistance Bed Mobility: Supine to Sit, Sit to Supine     Supine to sit: Min guard Sit to supine: Mod assist, HOB elevated   General bed mobility comments: assist for LEs back into bed, heavy reliance on rail and momentum    Transfers Overall transfer level: Needs assistance Equipment used: Rolling walker (2 wheels) Transfers: Sit to/from Stand Sit to Stand: Min guard, From elevated surface           General transfer comment: requests bed elevated to nearly standing     Balance Overall balance assessment: Needs assistance Sitting-balance support: Feet supported Sitting balance-[name] Scale: Good       Standing balance-[name] Scale: Poor Standing balance comment: B UE support for balance                           ADL either performed or assessed with clinical judgement   ADL Overall ADL's : Needs assistance/impaired     Grooming: Set up;Sitting   Upper Body Bathing: Minimal assistance;Sitting Upper Body Bathing Details (indicate cue type and reason): assist for back Lower Body Bathing: Total assistance;Sit to/from stand   Upper Body Dressing : Set up;Sitting   Lower Body Dressing: Total assistance;Sit to/from stand       Toileting- Architect and Hygiene: Total assistance;Sit to/from stand              Extremity/Trunk Assessment              Vision       Perception     Praxis      Cognition Arousal/Alertness: Awake/alert Behavior During  Therapy: WFL for tasks assessed/performed Overall Cognitive Status: Within Functional Limits for tasks assessed                                 General Comments: pt pleasant and willing to work with OT        Exercises      Shoulder Instructions       General Comments      Pertinent Vitals/ Pain       Pain Assessment Pain Assessment: No/denies pain  Home Living                                           Prior Functioning/Environment              Frequency  Min 1X/week        Progress Toward Goals  OT Goals(current goals can now be found in the care plan section)  Progress towards OT goals: Progressing toward goals  Acute Rehab OT Goals OT Goal Formulation: With patient Time For Goal Achievement: 11/14/22 Potential to Achieve Goals: Good ADL Goals Pt Will Perform Grooming: with set-up;sitting (at sink) Pt Will Perform Lower Body Dressing: with adaptive equipment;sit to/from stand;with mod assist Pt Will Transfer to Toilet: with min guard assist;ambulating;bedside commode Pt Will Perform Toileting - Clothing Manipulation and hygiene: with min guard assist;sit to/from stand  Plan Discharge plan remains appropriate    Co-evaluation                 AM-PAC OT "6 Clicks" Daily Activity     Outcome Measure   Help from another person eating meals?: None Help from another person taking care of personal grooming?: A Little Help from another person toileting, which includes using toliet, bedpan, or urinal?: Total Help from another person bathing (including washing, rinsing, drying)?: A Lot Help from another person to put on and taking off regular upper body clothing?: A Little Help from another person to put on and taking off regular lower body clothing?: Total 6 Click Score: 14    End of Session Equipment Utilized During Treatment: Oxygen;Rolling walker (2 wheels)  OT Visit Diagnosis: Unsteadiness on feet (R26.81);Other abnormalities of gait and mobility (R26.89);Muscle weakness (generalized) (M62.81);Adult, failure to thrive (R62.7)   Activity Tolerance Patient tolerated treatment well   Patient Left in bed;with call bell/phone within reach   Nurse Communication          Time: 1030-1104 OT Time Calculation (min): 34 min  Charges: OT General Charges $OT Visit: 1 Visit OT Treatments $Self Care/Home Management : 23-37 mins  Berna Spare,  OTR/L Acute Rehabilitation Services Office: [phone]   Evern Bio 11/01/2022, 11:40 AM

## 2022-11-01 NOTE — TOC Progression Note (Addendum)
Transition of Care [facility]) - Progression Note    Patient Details  Name: Gary [name] MRN: [id number] Date of Birth: 04/14/1956  [facility] of [facility] CM/SW Contact  [name], RN Phone Number: 11/01/2022, 3:02 PM  Clinical Narrative:    NCM and Clinical Nurse Specialist went in to speak with patient, to ask about his care at home.  He states his significant other , [name] , will be there with him 24/7.  He said he would really like to have a Nurse to come out.  He called [name] on the phone , NCM spoke with her to see if she would be able to sign the forms for [facility], she said she really did not want to at first with the family dynamics they had going on,  but then decided she would.   NCM called Molly Maduro with [name] to let him know this information, he said unfortunately they do not have the staff for this patient any longer they had to move on to another patient. Molly Maduro said NCM could try [facility].  Patient was ok with NCM calling [facility],  NCM spoke with Samatha  [phone] at [facility] and gave her the information, she states she will have Spencer to call this NCM. Karleen Hampshire said he will contact [name] , the patient's significant other her phone number is  [phone].  Per [name] she states she is suppose to meet with Karleen Hampshire on Monday at 12 noon.  This NCM confirmed this with Karleen Hampshire , he said after that he will get everything expedited as quick as possible for the Skilled Nursing for patient.     Expected Discharge Plan: Home w Home Health Services Barriers to Discharge: Continued Medical Work up  Expected Discharge Plan and Services In-house Referral: Clinical Social Work Discharge Planning Services: CM Consult Post Acute Care Choice: Home Health Living arrangements for the past 2 months: Single Family Home                 DME Arranged: N/A DME Agency: NA       HH Arranged: PT, OT HH Agency: [facility] Date HH Agency Contacted: 10/31/22 Time HH Agency Contacted:  1300 Representative spoke with at [facility] Agency: Amy   Social Determinants of Health (SDOH) Interventions SDOH Screenings   Food Insecurity: No Food Insecurity (10/18/2022)  Housing: Patient Declined (10/18/2022)  Transportation Needs: No Transportation Needs (10/18/2022)  Recent Concern: Transportation Needs - Unmet Transportation Needs (08/13/2022)  Utilities: Not At Risk (10/18/2022)  Depression (PHQ2-9): Low Risk  (06/07/2021)  Financial Resource Strain: Medium Risk (05/03/2017)  Physical Activity: Insufficiently Active (05/03/2017)  Social Connections: Unknown (08/06/2022)   Received from [facility]  Stress: No Stress Concern Present (05/03/2017)  Tobacco Use: Medium Risk (10/16/2022)    Readmission Risk Interventions    12/31/2021    8:58 AM 12/20/2021    4:21 PM 05/04/2021    1:26 PM  Readmission Risk Prevention Plan  Transportation Screening Complete Complete Complete  Medication Review Oceanographer) Complete Complete Complete  PCP or Specialist appointment within 3-5 days of discharge Complete Complete Complete  HRI or Home Care Consult Complete Complete Complete  SW Recovery Care/Counseling Consult Complete Complete Complete  Palliative Care Screening Complete Complete Not Applicable  Skilled Nursing Facility Not Applicable Not Applicable Not Applicable

## 2022-11-02 DIAGNOSIS — J9620 Acute and chronic respiratory failure, unspecified whether with hypoxia or hypercapnia: Secondary | ICD-10-CM

## 2022-11-02 DIAGNOSIS — Z7189 Other specified counseling: Secondary | ICD-10-CM | POA: Diagnosis not present

## 2022-11-02 DIAGNOSIS — Z515 Encounter for palliative care: Secondary | ICD-10-CM

## 2022-11-02 DIAGNOSIS — J9621 Acute and chronic respiratory failure with hypoxia: Secondary | ICD-10-CM

## 2022-11-02 MED ORDER — POTASSIUM CHLORIDE 20 MEQ PO PACK
40.0000 meq | PACK | ORAL | Status: AC
  Administered 2022-11-02 (×2): 40 meq via ORAL

## 2022-11-02 MED ORDER — POTASSIUM CHLORIDE 20 MEQ PO PACK
PACK | ORAL | Status: AC
  Filled 2022-11-02: qty 2

## 2022-11-02 NOTE — Progress Notes (Signed)
Patient was transfer to 2W 34. Patient on 8L O2 and call light in reach. Receiving RN with patient.

## 2022-11-02 NOTE — Progress Notes (Signed)
Responded to consult for IV. No IV medications ordered; RN states IV needed due to pt on telemetry. Discussed this with pt. Pt declined IV due to "going home on Monday" and "the dr said I didn't need it". Notified RN.

## 2022-11-02 NOTE — Consult Note (Signed)
Palliative Medicine Inpatient Consult Note  Consulting Provider:  Barnetta Chapel, MD   Reason for consult:   Palliative Care Consult Services Palliative Medicine Consult  Reason for Consult? HF - family meeting   11/02/2022  HPI:  Per intake H&P --> Patient is a 67 year old male, morbidly obese, past medical history significant for diverticulosis, OSA, OHS, hypertension, hyperlipidemia, GERD, substance use, depression, anxiety, anemia, COPD, rare chronic respiratory failure with hypoxia, CKD 3A, diastolic CHF presenting with shortness of breath.   Recent hospitalization 06/12 to 06/24 for respiratory failure due to pneumonia. During this admission emphasis has been on diuresis.   Palliative care consultation to further discuss goals of care.  Of note patient has had (+)5 admissions and (+) 4 ER visits within six months.   Seen multiple times by the PMT and during those times family attested that patients goals were continued to be geared towards full aggressive care.   Clinical Assessment/Goals of Care:  *Please note that this is a verbal dictation therefore any spelling or grammatical errors are due to the "Dragon Medical One" system interpretation.  I have reviewed medical records including EPIC notes, labs and imaging, received report from bedside RN, assessed the patient who is lying in bed completing his breakfast.    I met with Corky Sox to further discuss diagnosis prognosis, GOC, EOL wishes, disposition and options.   I introduced Palliative Medicine as specialized medical care for people living with serious illness. It focuses on providing relief from the symptoms and stress of a serious illness. The goal is to improve quality of life for both the patient and the family.  Medical History Review and Understanding:  I reviewed Dailyn's medical history of diverticulosis, OSA, OHS, HTN, HLD, GERD, depression, anxiety, CKD3, chronic respiratory failure requiring  tracheostomy placement on 6/20.   Social History:  Subhan shares that he lives in [place], [place]. He is divorced. He has three children. He worked as a Naval architect. He enjoys spending time with his long term girlfriend. He is a man of faith.  Functional and Nutritional State:  Preceding hospitalization, Ronnie shares that he was able to get around with the help of his girlfriend. He needs aid support with bathing and dressing. He is able to use a FWW for mobility. He has a robust appetite.   Advance Directives:  A detailed discussion was had today regarding advanced directives.  Discussed the importance of making advanced directives and a living will which to date he thinks he may have made. He would want his sisters Dellia Cloud and Melbourne Abts to be his   Code Status:  Concepts specific to code status, artifical feeding and hydration, continued IV antibiotics and rehospitalization was had.  The difference between a aggressive medical intervention path  and a palliative comfort care path for this patient at this time was had.   Patient is clear about his desire to live and he would want all measures pursued to achieve this inclusive of CPR and intubation.   Discussion:  We reviewed Lenus's present illness(s) and recurrent re-hospitalizations. We discussed his trajectory as related to chronic diseases inclusive of CHF and COPD. I shared my concerns in the setting of his precipitous declines over the months.   Honed in on patients congestive heart failure and chronicity of the diease. Review progression overtime despite optimal management.   He at this time has the goals of being able to get home with his girlfriend with additional support. He wants to  live and vocalizes good quality within his life.   Discussed the idea of outpatient palliative support which patient is agreeable to upon discharge.  Discussed the importance of continued conversation with family and their  medical  providers regarding overall plan of care and treatment options, ensuring decisions are within the context of the patients values and GOCs. _______________________________ Addendum:  I called patients sister, Peggy. I updated Peggy to the above conversation. She shares that one of her larger concerns is the patients mobility moving forward. She would like him to be able to get the care needed at home. She is apprehension talking about patients girlfriend, [name] in that there is some concern regarding if she is provided the necessary care in the patients home.   Decision Maker: Dellia Cloud [phone]  SUMMARY OF RECOMMENDATIONS   Full Code/ Full Scope of Care  Discussions held related to patient chronic disease burden and trajectory  Continue to allow time for outcomes  Patients goals are for improvement  Obtain a copy of patient's advanced directives.  Outpatient palliative support on discharge   Ongoing PMT support  Code Status/Advance Care Planning: FULL CODE  Palliative Prophylaxis:  Aspiration, Bowel Regimen, Delirium Protocol, Frequent Pain Assessment, Oral Care, Palliative Wound Care, and Turn Reposition  Additional Recommendations (Limitations, Scope, Preferences): Continue current care  Psycho-social/Spiritual:  Desire for further Chaplaincy support: Not presently Additional Recommendations: Education provided on chronic disease processes   Prognosis: Increased 76-month mortality   Discharge Planning: Discharge home with home health is most likely depending upon the services home health can provide.  Outpatient palliative support on discharge  Vitals:   11/02/22 0636 11/02/22 0640  BP:  (!) 142/79  Pulse:  79  Resp:  18  Temp:  98 F (36.7 C)  SpO2: 95%     Intake/Output Summary (Last 24 hours) at 11/02/2022 0700 Last data filed at 11/02/2022 0981 Gross per 24 hour  Intake 725 ml  Output 3890 ml  Net -3165 ml   Last Weight  Most recent update:  11/02/2022  6:41 AM    Weight  186.9 kg (412 lb 0.6 oz)              Gen: Older African-American male, obese, chronically ill in appearance HEENT: Tracheostomy, moist mucous membranes CV: Regular rate and rhythm PULM: On tracheostomy collar breathing is even and unlabored ABD: soft/nontender EXT: Generalized edema Neuro: Alert and oriented x3  PPS: 50%   This conversation/these recommendations were discussed with patient primary care team, Dr. Dartha Lodge  Billing based on MDM: High ______________________________________________________ Lamarr Lulas [facility] Palliative Medicine Team Team Cell Phone: [phone] Please utilize secure chat with additional questions, if there is no response within 30 minutes please call the above phone number  Palliative Medicine Team providers are available by phone from 7am to 7pm daily and can be reached through the team cell phone.  Should this patient require assistance outside of these hours, please call the patient's attending physician.

## 2022-11-02 NOTE — Progress Notes (Signed)
Physical Therapy Treatment Patient Details Name: Gary [name] MRN: [id number] DOB: Aug 27, 1955 Today's Date: 11/02/2022   History of Present Illness 67 y.o. male presents to [facility] hospital on 10/16/2022 with reports of SOB and chest pain. Pt was in ED earlier this morning and elected to discharge home, however in route to home decided to return home for placement due to inadequate caregiver support. Of note, pt has had 4 admissions in the last 6 months, and 9 admissions in the last year. PMH includes obesity, hypoxia, respiratory failure with tracheostomy, COPD, GIB, HFpEF.    PT Comments  Pt motivated to progress activity tolerance and strength. Pt ambulated in hallway with use of RW and close guard for safety, chair follow helpful as pt fatigues at times without warning and needs to sit. Pt with SPO2 98% on 10L/40% immediately post-gait. Pt states today he feels confident about d/c home if he has [facility] services. Will continue to follow.     Assistance Recommended at Discharge Frequent or constant Supervision/Assistance  If plan is discharge home, recommend the following:  Can travel by private vehicle    A lot of help with walking and/or transfers;A lot of help with bathing/dressing/bathroom;Assistance with cooking/housework;Assist for transportation;Help with stairs or ramp for entrance      Equipment Recommendations  Other (comment) (requesting lift chair)    Recommendations for Other Services       Precautions / Restrictions Precautions Precautions: Fall Precaution Comments: trach collar (8L/40% at rest, 10L/40% gait given ambulation kit settings)     Mobility  Bed Mobility Overal bed mobility: Needs Assistance Bed Mobility: Supine to Sit     Supine to sit: Min assist     General bed mobility comments: assist for progression of LLE to EOB, pt with use of HOB elevation and bedrails to perform    Transfers Overall transfer level: Needs assistance Equipment used: Rolling  walker (2 wheels) Transfers: Sit to/from Stand Sit to Stand: Min guard, From elevated surface           General transfer comment: requests bed elevated to nearly standing    Ambulation/Gait Ambulation/Gait assistance: Min guard Gait Distance (Feet): 90 Feet Assistive device: Rolling walker (2 wheels) Gait Pattern/deviations: Step-through pattern, Wide base of support, Decreased stride length Gait velocity: decr     General Gait Details: close chair follow as pt fatigues and needs to sit during hallway gait, cues for upright posture. 40%/10L via trach collar   Stairs             Wheelchair Mobility     Tilt Bed    Modified Rankin (Stroke Patients Only)       Balance Overall balance assessment: Needs assistance Sitting-balance support: Feet supported Sitting balance-[name] Scale: Good       Standing balance-[name] Scale: Poor Standing balance comment: B UE support for balance                            Cognition Arousal/Alertness: Awake/alert Behavior During Therapy: WFL for tasks assessed/performed Overall Cognitive Status: Within Functional Limits for tasks assessed                                          Exercises      General Comments General comments (skin integrity, edema, etc.): 98% on 40%/10L immediately post-gait  Pertinent Vitals/Pain Pain Assessment Pain Assessment: No/denies pain    Home Living                          Prior Function            PT Goals (current goals can now be found in the care plan section) Acute Rehab PT Goals Patient Stated Goal: get stronger and go home PT Goal Formulation: With patient Time For Goal Achievement: 11/14/22 Potential to Achieve Goals: Good Progress towards PT goals: Progressing toward goals    Frequency    Min 1X/week      PT Plan Current plan remains appropriate    Co-evaluation              AM-PAC PT "6 Clicks" Mobility    Outcome Measure  Help needed turning from your back to your side while in a flat bed without using bedrails?: A Little Help needed moving from lying on your back to sitting on the side of a flat bed without using bedrails?: A Little Help needed moving to and from a bed to a chair (including a wheelchair)?: A Little Help needed standing up from a chair using your arms (e.g., wheelchair or bedside chair)?: A Little Help needed to walk in hospital room?: A Little Help needed climbing 3-5 steps with a railing? : A Lot 6 Click Score: 17    End of Session Equipment Utilized During Treatment: Oxygen Activity Tolerance: Patient tolerated treatment well Patient left: in chair;with call bell/phone within reach;Other (comment) (pt lift back to bed, knows to press call button and wait for assist) Nurse Communication: Mobility status PT Visit Diagnosis: Muscle weakness (generalized) (M62.81);Difficulty in walking, not elsewhere classified (R26.2)     Time: 1440-1505 PT Time Calculation (min) (ACUTE ONLY): 25 min  Charges:    $Gait Training: 8-22 mins $Therapeutic Activity: 8-22 mins PT General Charges $$ ACUTE PT VISIT: 1 Visit                     Marye Round, PT DPT Acute Rehabilitation Services Secure Chat Preferred  Office [phone]    Bernardina Cacho Sheliah Plane 11/02/2022, 4:57 PM

## 2022-11-02 NOTE — Progress Notes (Signed)
Progress Note   Patient: Gary [name] ZOX:[id number] DOB: 19-Apr-1955 DOA: 10/16/2022     16 DOS: the patient was seen and examined on 11/02/2022   Brief hospital course: Patient is a 67 year old male, morbidly obese, past medical history significant for diverticulosis, OSA, OHS, hypertension, hyperlipidemia, GERD, substance use, depression, anxiety, anemia, COPD, rare chronic respiratory failure with hypoxia, CKD 3A, diastolic CHF presenting with shortness of breath. Recent hospitalization 06/12 to 06/24 for respiratory failure due to pneumonia.   Reported worsening dyspnea and lower extremity edema for the last 7 days prior to admission. Reported he was not able to get his medications, because he had no help at home. On his initial physical examination his blood pressure was 114/67, HR 93, RR 31 and 02 saturation 93%, trach in place,  lungs with decreased breath sounds bilaterally with bilateral rales, heart with S1 and S2 present and rhythmic, abdomen with no distention and positive lower extremity edema.   Chest radiograph with hypoinflation and poor penetration, positive cardiomegaly and bilateral hilar vascular congestion.    Patient's respiratory status continues to improve somewhat over the past 48 hours, appreciate pulmonology insight recommendations and adjustment in home nebulized medications.  At this time patient is refusing to be discharged to [facility] facility/LT[facility] which was her previous plan.    Plan to discharge home when sable 02 requirements, down to 6 L/min per trach collar.   07/16 pending disposition.  Decreasing oxygen supplementation and working along with [facility] for discharge purposes.  Heparin medically stable at the moment.  In no acute distress.  11/01/2022: Patient seen.  Patient remained stable.  Patient is on 7 L of supplemental oxygen.  Patient tells me that at baseline, he is on 6 L of supplemental oxygen.  Palliative care team has been consulted for goals of care can be  discussed.  Likely discharge back home with home health.  For family meeting on Monday, 11/05/2022.  Input from the case management team is highly appreciated.  11/02/2022: Patient seen.  Palliative care input is appreciated.  Patient is currently on a therapeutic dose of supplemental oxygen.  Patient is in chair.  Patient tells me that he walked the hallway earlier today.  No new complaints.  Assessment and Plan: Acute on chronic diastolic CHF (congestive heart failure) ([name]) Echocardiogram with preserved LV systolic function 60 to 70%, mild LVH, RV systolic function preserved, no significant valvular disease. Poor windows.  -Continue diuresis with oral diuretics using torsemide 60mg  BID -will also continue SGLT 2 inh to augment diuresis   -Following strict intake and output; continue to follow daily weights and low-sodium diet. 11/02/2022: Stable.  Continue current management.  Acute on chronic hypoxemic respiratory failure. Continue diuresis, supplemental 02 per trach collar, currently on 8-10 L/min with 02 saturation mid 90%. 11/02/2022: Stable.  Patient is currently on 8.5 L of supplemental oxygen.  At baseline, patient is on 6 L of supplemental oxygen.  COPD (chronic obstructive pulmonary disease) ([name]) -Continue bronchodilator therapy and inhaled corticosteroids.  -Patient on chronic prednisone 10 mg daily.  -Continue trach care.  -continue PT and OT.  HTN (hypertension) -Continue blood pressure support with midodrine. -Continue to follow vital signs. 11/02/2022: Blood pressure stable.  GERD without esophagitis -Continue with PPI -Lifestyle changes discussed with patient.  Morbid obesity with BMI of 50.0-59.9, adult ([name]) -Body mass index is 50.16 kg/m.  -Low-calorie diet and portion control discussed with patient.  History of DVT (deep vein thrombosis) -Continue anticoagulation with apixaban.  -No signs  of overt bleeding. -Reports no lower extremity pain currently.  Iron  deficiency anemia -Hgb has been stable.  -No signs of overt bleeding appreciated.  Hyperlipidemia -Continue statin therapy.  -Heart healthy diet discussed with patient.  Chronic colitis -Continue treatment with mesalamine.  -No signs of acute exacerbation.  -Patient reports no nausea, no vomiting and no abdominal pain at the moment. -Tolerating diet without problems.  AKI (acute kidney injury) ([name]) -Continue to closely follow renal function trend -Creatinine fluctuating with a creatinine in the 1.3-1.4 at the moment; patient renal function has been as high as 1.7--1.8 in the past. -Continue to maintain adequate oral hydration -Continue to avoid other nephrotoxic agents. -Latest cr 1.86  -Repeat basic metabolic panel in a.m. -Continue to follow a strict I's and O's and urine output.  Subjective: -No new changes. -No new symptoms endorsed.[name]  Physical Exam: Vitals:   11/02/22 0640 11/02/22 0811 11/02/22 1155 11/02/22 1505  BP: (!) 142/79     Pulse: 79 93 83 91  Resp: 18 18 20  (!) 22  Temp: 98 F (36.7 C)     TempSrc: Oral     SpO2:  93% 92% 91%  Weight: (!) 186.9 kg     Height:       General exam: Patient is morbidly obese.  Patient is s/p tracheostomy.  The trach is capped.  [name] Respiratory system: Decreased breath sounds  Cardiovascular system: S1-S2.   Gastrointestinal system: Abdomen is obese Central nervous system: Awake and alert. Extremities: Bilateral lower extremity edema is improving.   Family Communication:    Disposition: Status is: Inpatient Remains inpatient appropriate because: pending disposition, he has declined [facility] (reported bad experience in the past); he was amenable for other LTAC's options if any.   Planned Discharge Destination: to be determined.    Author: Barnetta Chapel, MD 11/02/2022 4:36 PM  For on call review [email or web address].

## 2022-11-02 NOTE — Progress Notes (Signed)
   11/02/22 2300  BiPAP/CPAP/SIPAP  BiPAP/CPAP/SIPAP Pt Type Adult  BiPAP/CPAP/SIPAP  (home CPAP)  Mask Type  (hooked to trach)  Respiratory Rate 18 breaths/min  EPAP  (home settings)  Flow Rate 10 lpm  Patient Home Equipment Yes  Safety Check Completed by RT for Home Unit Yes, no issues noted  BiPAP/CPAP /SiPAP Vitals  Pulse Rate 85  Resp 18  SpO2 96 %  MEWS Score/Color  MEWS Score 0  MEWS Score Color Green   Home CPAP attached to trach, on home settings w/ 10L bled in. Pt tolerating well.

## 2022-11-02 NOTE — Progress Notes (Signed)
   11/02/22 0032  BiPAP/CPAP/SIPAP  $ Non-Invasive Home Ventilator  Subsequent  BiPAP/CPAP/SIPAP Pt Type Adult  BiPAP/CPAP/SIPAP Resmed  Respiratory Rate 22 breaths/min  FiO2 (%) 30 %  Patient Home Equipment Yes  Safety Check Completed by RT for Home Unit Yes, no issues noted  BiPAP/CPAP /SiPAP Vitals  Temp 98 F (36.7 C)  Pulse Rate 86  Resp 18  BP 110/68  SpO2 91 %  MEWS Score/Color  MEWS Score 0  MEWS Score Color Green

## 2022-11-02 NOTE — Progress Notes (Signed)
OT Cancellation Note  Patient Details Name: Gary [name] MRN: [id number] DOB: [birth year]   Cancelled Treatment:    Reason Eval/Treat Not Completed: Fatigue/lethargy limiting ability to participate. Pt requests therapy return later. OT will follow up as time allows  Galen Manila 11/02/2022, 1:08 PM

## 2022-11-03 ENCOUNTER — Inpatient Hospital Stay (HOSPITAL_COMMUNITY): Payer: 59

## 2022-11-03 DIAGNOSIS — Z515 Encounter for palliative care: Secondary | ICD-10-CM | POA: Diagnosis not present

## 2022-11-03 DIAGNOSIS — J9621 Acute and chronic respiratory failure with hypoxia: Secondary | ICD-10-CM | POA: Diagnosis not present

## 2022-11-03 DIAGNOSIS — J9622 Acute and chronic respiratory failure with hypercapnia: Secondary | ICD-10-CM | POA: Diagnosis not present

## 2022-11-03 LAB — RENAL FUNCTION PANEL
Albumin: 2.8 g/dL — ABNORMAL LOW (ref 3.5–5.0)
Albumin: 3 g/dL — ABNORMAL LOW (ref 3.5–5.0)
Anion gap: 12 (ref 5–15)
Anion gap: 11 (ref 5–15)
BUN: 39 mg/dL — ABNORMAL HIGH (ref 8–23)
BUN: 35 mg/dL — ABNORMAL HIGH (ref 8–23)
CO2: 32 mmol/L (ref 22–32)
CO2: 33 mmol/L — ABNORMAL HIGH (ref 22–32)
Calcium: 8 mg/dL — ABNORMAL LOW (ref 8.9–10.3)
Calcium: 8.3 mg/dL — ABNORMAL LOW (ref 8.9–10.3)
Chloride: 94 mmol/L — ABNORMAL LOW (ref 98–111)
Chloride: 93 mmol/L — ABNORMAL LOW (ref 98–111)
Creatinine, Ser: 1.45 mg/dL — ABNORMAL HIGH (ref 0.61–1.24)
Creatinine, Ser: 1.44 mg/dL — ABNORMAL HIGH (ref 0.61–1.24)
GFR, Estimated: 53 mL/min — ABNORMAL LOW (ref >60)
GFR, Estimated: 54 mL/min — ABNORMAL LOW (ref >60)
Glucose, Bld: 138 mg/dL — ABNORMAL HIGH (ref 70–99)
Glucose, Bld: 150 mg/dL — ABNORMAL HIGH (ref 70–99)
Phosphorus: 4.5 mg/dL (ref 2.5–4.6)
Phosphorus: 4.2 mg/dL (ref 2.5–4.6)
Potassium: 4.1 mmol/L (ref 3.5–5.1)
Potassium: 4.3 mmol/L (ref 3.5–5.1)
Sodium: 138 mmol/L (ref 135–145)
Sodium: 137 mmol/L (ref 135–145)

## 2022-11-03 LAB — BLOOD GAS, ARTERIAL
Acid-Base Excess: 13.8 mmol/L — ABNORMAL HIGH (ref 0.0–2.0)
Bicarbonate: 40.8 mmol/L — ABNORMAL HIGH (ref 20.0–28.0)
Drawn by: 164
O2 Saturation: 92.3 %
Patient temperature: 37
pCO2 arterial: 60 mmHg — ABNORMAL HIGH (ref 32–48)
pH, Arterial: 7.44 (ref 7.35–7.45)
pO2, Arterial: 62 mmHg — ABNORMAL LOW (ref 83–108)

## 2022-11-03 LAB — MAGNESIUM
Magnesium: 2.3 mg/dL (ref 1.7–2.4)
Magnesium: 2.3 mg/dL (ref 1.7–2.4)

## 2022-11-03 LAB — BRAIN NATRIURETIC PEPTIDE: B Natriuretic Peptide: 29.6 pg/mL (ref 0.0–100.0)

## 2022-11-03 MED ORDER — POTASSIUM CHLORIDE 20 MEQ PO PACK
40.0000 meq | PACK | Freq: Every day | ORAL | Status: AC
  Administered 2022-11-04: 40 meq via ORAL
  Filled 2022-11-03 (×2): qty 2

## 2022-11-03 MED ORDER — METOLAZONE 2.5 MG PO TABS
2.5000 mg | ORAL_TABLET | Freq: Two times a day (BID) | ORAL | Status: DC
  Administered 2022-11-03 – 2022-11-04 (×2): 2.5 mg via ORAL
  Filled 2022-11-03 (×3): qty 1

## 2022-11-03 NOTE — Plan of Care (Signed)

## 2022-11-03 NOTE — Progress Notes (Signed)
Patient removed from BIPAP and placed on 40% trach collar, currently tolerating well. RT will continue to monitor.

## 2022-11-03 NOTE — Progress Notes (Signed)
Progress Note   Patient: Gary [name]:[id number] DOB: 28-Jun-1955 DOA: 10/16/2022     17 DOS: the patient was seen and examined on 11/03/2022   Brief hospital course: Patient is a 67 year old male, morbidly obese, past medical history significant for diverticulosis, OSA, OHS, hypertension, hyperlipidemia, GERD, substance use, depression, anxiety, anemia, COPD, rare chronic respiratory failure with hypoxia, CKD 3A, diastolic CHF presenting with shortness of breath. Recent hospitalization 06/12 to 06/24 for respiratory failure due to pneumonia.   Reported worsening dyspnea and lower extremity edema for the last 7 days prior to admission. Reported he was not able to get his medications, because he had no help at home. On his initial physical examination his blood pressure was 114/67, HR 93, RR 31 and 02 saturation 93%, trach in place,  lungs with decreased breath sounds bilaterally with bilateral rales, heart with S1 and S2 present and rhythmic, abdomen with no distention and positive lower extremity edema.   Chest radiograph with hypoinflation and poor penetration, positive cardiomegaly and bilateral hilar vascular congestion.    Patient's respiratory status continues to improve somewhat over the past 48 hours, appreciate pulmonology insight recommendations and adjustment in home nebulized medications.  At this time patient is refusing to be discharged to [facility] facility/[facility] which was her previous plan.    Plan to discharge home when sable 02 requirements, down to 6 L/min per trach collar.   07/16 pending disposition.  Decreasing oxygen supplementation and working along with [facility] for discharge purposes.  Heparin medically stable at the moment.  In no acute distress.  11/01/2022: Patient seen.  Patient remained stable.  Patient is on 7 L of supplemental oxygen.  Patient tells me that at baseline, he is on 6 L of supplemental oxygen.  Palliative care team has been consulted for goals of care can be  discussed.  Likely discharge back home with home health.  For family meeting on Monday, 11/05/2022.  Input from the case management team is highly appreciated.  11/02/2022: Patient seen.  Palliative care input is appreciated.  Patient is currently on a therapeutic dose of supplemental oxygen.  Patient is in chair.  Patient tells me that he walked the hallway earlier today.  No new complaints.  11/03/2022: Patient is currently on 10 L of supplemental oxygen.  Will obtain a chest x-ray, check cardiac BNP, adjust patient diuretics.  Titrate oxygen supplementation to keep O2 sat greater than 91% i.e. avoid excessive oxygen supplementation.  If potassium is within normal range, will introduce metolazone 2.5 Mg p.o. twice daily.  Monitor electrolytes closely.  Assessment and Plan: Acute on chronic diastolic CHF (congestive heart failure) ([name]) Echocardiogram with preserved LV systolic function 60 to 70%, mild LVH, RV systolic function preserved, no significant valvular disease. Poor windows.  -Continue diuresis with oral diuretics using torsemide 60mg  BID -will also continue SGLT 2 inh to augment diuresis   -Following strict intake and output; continue to follow daily weights and low-sodium diet. 11/02/2022: Stable.  Continue current management. 11/03/2022: Check cardiac BNP.  Add metolazone.  Monitor renal function and electrolytes closely.  Acute on chronic hypoxemic respiratory failure. Continue diuresis, supplemental 02 per trach collar, currently on 8-10 L/min with 02 saturation mid 90%. 11/02/2022: Stable.  Patient is currently on 8.5 L of supplemental oxygen.  At baseline, patient is on 6 L of supplemental oxygen. 11/03/2022: See above documentation.  Patient is currently on 10 L of supplemental oxygen.  Suspect there is excessive supplementation of needed oxygen.  Goal will  be to keep O2 sat greater than 91%.  ABG.  Chest x-ray.  Cardiac BNP.  Introduce metolazone.  Flutter valve device.  COPD  (chronic obstructive pulmonary disease) ([name]) -Continue bronchodilator therapy and inhaled corticosteroids.  -Patient on chronic prednisone 10 mg daily.  -Continue trach care.  -continue PT and OT. 01/04/2023: Good air entry.  HTN (hypertension) -Continue blood pressure support with midodrine. -Continue to follow vital signs. 11/02/2022: Blood pressure stable.  GERD without esophagitis -Continue with PPI -Lifestyle changes discussed with patient.  Morbid obesity with BMI of 50.0-59.9, adult ([name]) -Body mass index is 50.61 kg/m.  -Low-calorie diet and portion control discussed with patient.  History of DVT (deep vein thrombosis) -Continue anticoagulation with apixaban.  -No signs of overt bleeding. -Reports no lower extremity pain currently.  Iron deficiency anemia -Hgb has been stable.  -No signs of overt bleeding appreciated.  Hyperlipidemia -Continue statin therapy.  -Heart healthy diet discussed with patient.  Chronic colitis -Continue treatment with mesalamine.  -No signs of acute exacerbation.  -Patient reports no nausea, no vomiting and no abdominal pain at the moment. -Tolerating diet without problems.  AKI (acute kidney injury) ([name]) -Continue to closely follow renal function trend -Creatinine fluctuating with a creatinine in the 1.3-1.4 at the moment; patient renal function has been as high as 1.7--1.8 in the past. -Continue to maintain adequate oral hydration -Continue to avoid other nephrotoxic agents. -Latest cr 1.86  -Repeat basic metabolic panel in a.m. -Continue to follow a strict I's and O's and urine output.  Subjective: -No new changes. -No new symptoms endorsed. -Requiring high amount of supplemental oxygen.  Physical Exam: Vitals:   11/03/22 1146 11/03/22 1231 11/03/22 1535 11/03/22 1703  BP:  (!) 136/103  (!) 142/80  Pulse: 81 84 82 89  Resp: 18 (!) 22 20 20   Temp:  98 F (36.7 C)  98 F (36.7 C)  TempSrc:  Oral    SpO2: 96% 96% 96%  94%  Weight:      Height:       General exam: Patient is morbidly obese.  Patient is s/p tracheostomy.  The trach is capped.  [name] Respiratory system: Decreased breath sounds  Cardiovascular system: S1-S2.   Gastrointestinal system: Abdomen is obese Central nervous system: Awake and alert. Extremities: Bilateral lower extremity edema is improving.   Family Communication:    Disposition: Status is: Inpatient Remains inpatient appropriate because: pending disposition, he has declined [facility] (reported bad experience in the past); he was amenable for other LTAC's options if any.   Planned Discharge Destination: to be determined.    Author: Barnetta Chapel, MD 11/03/2022 5:05 PM  For on call review [email or web address].

## 2022-11-03 NOTE — Progress Notes (Signed)
Palliative Medicine Inpatient Follow Up Note   HPI: Patient is a 67 year old male, morbidly obese, past medical history significant for diverticulosis, OSA, OHS, hypertension, hyperlipidemia, GERD, substance use, depression, anxiety, anemia, COPD, rare chronic respiratory failure with hypoxia, CKD 3A, diastolic CHF presenting with shortness of breath.    Recent hospitalization 06/12 to 06/24 for respiratory failure due to pneumonia. During this admission emphasis has been on diuresis.    Palliative care consultation to further discuss goals of care.   Of note patient has had (+)5 admissions and (+) 4 ER visits within six months.    Seen multiple times by the PMT and during those times family attested that patients goals were continued to be geared towards full aggressive care.    Today's Discussion 11/03/2022  *Please note that this is a verbal dictation therefore any spelling or grammatical errors are due to the "[employer]" system interpretation.  Chart reviewed inclusive of vital signs, progress notes, laboratory results, and diagnostic images.   I met with Gary [name] this morning. He shares frustration with having moved to [facility]. He shares that they do not have a bariatric chair or walker for him to use. He states that he is upset the one he had prior was not brought down with him.  I was able to help patients RN, Allie with repositioning the patient to a level of comfort.   Created space and opportunity for patient to explore thoughts feelings and fears regarding current medical situation. He shares that he feels well overall though he would like to meet "the guy" on Monday with his new home health agency to "sign the papers". I asked him if he feels this help will be sufficient. Eashan shares that he does feel this will be - he hopes it will be. Lipa is clear about the hopes for improvement moving forward. He re-affirms his desires for all modalities of care to extend life and living.    Questions and concerns addressed/Palliative Support Provided.   Objective Assessment: Vital Signs Vitals:   11/03/22 1231 11/03/22 1535  BP: (!) 136/103   Pulse: 84 82  Resp: (!) 22 20  Temp: 98 F (36.7 C)   SpO2: 96% 96%    Intake/Output Summary (Last 24 hours) at 11/03/2022 1610 Last data filed at 11/03/2022 1410 Gross per 24 hour  Intake 1020 ml  Output 2600 ml  Net -1580 ml   Last Weight  Most recent update: 11/03/2022  6:21 AM    Weight  188.6 kg (415 lb 12.6 oz)              Gen: Older African-American male, obese, chronically ill in appearance HEENT: Tracheostomy, moist mucous membranes CV: Regular rate and rhythm PULM: On tracheostomy collar breathing is even and unlabored ABD: soft/nontender EXT: Generalized edema Neuro: Alert and oriented x3  SUMMARY OF RECOMMENDATIONS   Full Code/ Full Scope of Care   Discussions held related to patient chronic disease burden and trajectory   Continue to allow time for outcomes   Patients goals are for improvement   Obtain a copy of patient's advanced directives from patients sister   Outpatient palliative support on discharge   PMT will peripherally follow along   Billing based on MDM: High ______________________________________________________________________________________ Lamarr Lulas [place] Palliative Medicine Team Team Cell Phone: [phone] Please utilize secure chat with additional questions, if there is no response within 30 minutes please call the above phone number  Palliative Medicine Team providers are available by  phone from 7am to 7pm daily and can be reached through the team cell phone.  Should this patient require assistance outside of these hours, please call the patient's attending physician.

## 2022-11-04 DIAGNOSIS — J9620 Acute and chronic respiratory failure, unspecified whether with hypoxia or hypercapnia: Secondary | ICD-10-CM | POA: Diagnosis not present

## 2022-11-04 LAB — RENAL FUNCTION PANEL
Albumin: 3.3 g/dL — ABNORMAL LOW (ref 3.5–5.0)
Anion gap: 18 — ABNORMAL HIGH (ref 5–15)
BUN: 41 mg/dL — ABNORMAL HIGH (ref 8–23)
CO2: 30 mmol/L (ref 22–32)
Calcium: 8.4 mg/dL — ABNORMAL LOW (ref 8.9–10.3)
Chloride: 87 mmol/L — ABNORMAL LOW (ref 98–111)
Creatinine, Ser: 1.62 mg/dL — ABNORMAL HIGH (ref 0.61–1.24)
GFR, Estimated: 47 mL/min — ABNORMAL LOW (ref >60)
Glucose, Bld: 172 mg/dL — ABNORMAL HIGH (ref 70–99)
Phosphorus: 4.7 mg/dL — ABNORMAL HIGH (ref 2.5–4.6)
Potassium: 4.2 mmol/L (ref 3.5–5.1)
Sodium: 135 mmol/L (ref 135–145)

## 2022-11-04 LAB — MAGNESIUM: Magnesium: 2.3 mg/dL (ref 1.7–2.4)

## 2022-11-04 MED ORDER — METOLAZONE 2.5 MG PO TABS
2.5000 mg | ORAL_TABLET | Freq: Two times a day (BID) | ORAL | Status: DC
  Administered 2022-11-04: 2.5 mg via ORAL
  Filled 2022-11-04 (×2): qty 1

## 2022-11-04 NOTE — Progress Notes (Signed)
Progress Note   Patient: Gary [name] UXN:[id number] DOB: 1955/11/25 DOA: 10/16/2022     18 DOS: the patient was seen and examined on 11/04/2022   Brief hospital course: Patient is a 67 year old male, morbidly obese, past medical history significant for diverticulosis, OSA, OHS, hypertension, hyperlipidemia, GERD, substance use, depression, anxiety, anemia, COPD, rare chronic respiratory failure with hypoxia, CKD 3A, diastolic CHF presenting with shortness of breath. Recent hospitalization 06/12 to 06/24 for respiratory failure due to pneumonia.   Reported worsening dyspnea and lower extremity edema for the last 7 days prior to admission. Reported he was not able to get his medications, because he had no help at home. On his initial physical examination his blood pressure was 114/67, HR 93, RR 31 and 02 saturation 93%, trach in place,  lungs with decreased breath sounds bilaterally with bilateral rales, heart with S1 and S2 present and rhythmic, abdomen with no distention and positive lower extremity edema.   Chest radiograph with hypoinflation and poor penetration, positive cardiomegaly and bilateral hilar vascular congestion.    Patient's respiratory status continues to improve somewhat over the past 48 hours, appreciate pulmonology insight recommendations and adjustment in home nebulized medications.  At this time patient is refusing to be discharged to [facility] facility/[facility]AC which was her previous plan.    Plan to discharge home when sable 02 requirements, down to 6 L/min per trach collar.   07/16 pending disposition.  Decreasing oxygen supplementation and working along with [facility] for discharge purposes.  Heparin medically stable at the moment.  In no acute distress.  11/01/2022: Patient seen.  Patient remained stable.  Patient is on 7 L of supplemental oxygen.  Patient tells me that at baseline, he is on 6 L of supplemental oxygen.  Palliative care team has been consulted for goals of care can be  discussed.  Likely discharge back home with home health.  For family meeting on Monday, 11/05/2022.  Input from the case management team is highly appreciated.  11/02/2022: Patient seen.  Palliative care input is appreciated.  Patient is currently on a therapeutic dose of supplemental oxygen.  Patient is in chair.  Patient tells me that he walked the hallway earlier today.  No new complaints.  11/03/2022: Patient is currently on 10 L of supplemental oxygen.  Will obtain a chest x-ray, check cardiac BNP, adjust patient diuretics.  Titrate oxygen supplementation to keep O2 sat greater than 91% i.e. avoid excessive oxygen supplementation.  If potassium is within normal range, will introduce metolazone 2.5 Mg p.o. twice daily.  Monitor electrolytes closely.  11/04/2022: Patient seen.  Patient remained stable.  Titrate supplemental oxygen to keep O2 sat greater than or equal to 91%.  Leg edema has improved significantly.  Optimize volume treatment.  Check renal panel today.  Assessment and Plan: Acute on chronic diastolic CHF (congestive heart failure) ([name]) Echocardiogram with preserved LV systolic function 60 to 70%, mild LVH, RV systolic function preserved, no significant valvular disease. Poor windows.  -Continue diuresis with oral diuretics using torsemide 60mg  BID -will also continue SGLT 2 inh to augment diuresis   -Following strict intake and output; continue to follow daily weights and low-sodium diet. 11/02/2022: Stable.  Continue current management. 11/03/2022: Check cardiac BNP.  Add metolazone.  Monitor renal function and electrolytes closely. 11/04/2022: Stable.  No symptoms.  Acute on chronic hypoxemic respiratory failure. Continue diuresis, supplemental 02 per trach collar, currently on 8-10 L/min with 02 saturation mid 90%. 11/02/2022: Stable.  Patient is currently on  8.5 L of supplemental oxygen.  At baseline, patient is on 6 L of supplemental oxygen. 11/03/2022: See above documentation.   Patient is currently on 10 L of supplemental oxygen.  Suspect there is excessive supplementation of needed oxygen.  Goal will be to keep O2 sat greater than 91%.  ABG.  Chest x-ray.  Cardiac BNP.  Introduce metolazone.  Flutter valve device. 11/04/2022: Stable.  Titrate supplemental oxygen to keep O2 sat at 91% or greater.  COPD (chronic obstructive pulmonary disease) ([name]) -Continue bronchodilator therapy and inhaled corticosteroids.  -Patient on chronic prednisone 10 mg daily.  -Continue trach care.  -continue PT and OT. 01/05/2023: Good air entry.  HTN (hypertension) -Continue blood pressure support with midodrine. -Continue to follow vital signs. 11/02/2022: Blood pressure stable.  GERD without esophagitis -Continue with PPI -Lifestyle changes discussed with patient.  Morbid obesity with BMI of 50.0-59.9, adult ([name]) -Body mass index is 50.1 kg/m.  -Low-calorie diet and portion control discussed with patient.  History of DVT (deep vein thrombosis) -Continue anticoagulation with apixaban.  -No signs of overt bleeding. -Reports no lower extremity pain currently.  Iron deficiency anemia -Hgb has been stable.  -No signs of overt bleeding appreciated.  Hyperlipidemia -Continue statin therapy.  -Heart healthy diet discussed with patient.  Chronic colitis -Continue treatment with mesalamine.  -No signs of acute exacerbation.  -Patient reports no nausea, no vomiting and no abdominal pain at the moment. -Tolerating diet without problems.  AKI (acute kidney injury) ([name]) -Continue to closely follow renal function trend -Creatinine fluctuating with a creatinine in the 1.3-1.4 at the moment; patient renal function has been as high as 1.7--1.8 in the past. -Continue to maintain adequate oral hydration -Continue to avoid other nephrotoxic agents. -Latest cr 1.86  -Repeat basic metabolic panel in a.m. -Continue to follow a strict I's and O's and urine output.  Subjective: -No  new changes. -No new symptoms endorsed. -Requiring high amount of supplemental oxygen.  Physical Exam: Vitals:   11/04/22 0723 11/04/22 0807 11/04/22 0907 11/04/22 1240  BP: 109/71     Pulse: 86  99 87  Resp: 19  20 19   Temp: (!) 97.5 F (36.4 C)     TempSrc: Oral     SpO2: 90% 98% 98% 98%  Weight:      Height:       General exam: Patient is morbidly obese.  Patient is s/p tracheostomy.  The trach is capped.  [name] Respiratory system: Decreased breath sounds  Cardiovascular system: S1-S2.   Gastrointestinal system: Abdomen is obese Central nervous system: Awake and alert. Extremities: Bilateral lower extremity edema has improved significantly.   Family Communication:    Disposition: Status is: Inpatient Remains inpatient appropriate because: pending disposition, he has declined [facility] (reported bad experience in the past); he was amenable for other LTAC's options if any.   Planned Discharge Destination: to be determined.    Author: Barnetta Chapel, MD 11/04/2022 3:30 PM  For on call review [email or web address].

## 2022-11-04 NOTE — Plan of Care (Signed)
°  Problem: Activity: Goal: Capacity to carry out activities will improve Outcome: Progressing   Problem: Education: Goal: Knowledge of General Education information will improve Description: Including pain rating scale, medication(s)/side effects and non-pharmacologic comfort measures Outcome: Progressing   Problem: Clinical Measurements: Goal: Ability to maintain clinical measurements within normal limits will improve Outcome: Progressing

## 2022-11-04 NOTE — Plan of Care (Signed)

## 2022-11-05 DIAGNOSIS — J9620 Acute and chronic respiratory failure, unspecified whether with hypoxia or hypercapnia: Secondary | ICD-10-CM | POA: Diagnosis not present

## 2022-11-05 LAB — RENAL FUNCTION PANEL
Albumin: 3.5 g/dL (ref 3.5–5.0)
Anion gap: 15 (ref 5–15)
BUN: 52 mg/dL — ABNORMAL HIGH (ref 8–23)
CO2: 33 mmol/L — ABNORMAL HIGH (ref 22–32)
Calcium: 8.3 mg/dL — ABNORMAL LOW (ref 8.9–10.3)
Chloride: 86 mmol/L — ABNORMAL LOW (ref 98–111)
Creatinine, Ser: 1.87 mg/dL — ABNORMAL HIGH (ref 0.61–1.24)
GFR, Estimated: 39 mL/min — ABNORMAL LOW (ref >60)
Glucose, Bld: 165 mg/dL — ABNORMAL HIGH (ref 70–99)
Phosphorus: 5.8 mg/dL — ABNORMAL HIGH (ref 2.5–4.6)
Potassium: 3.5 mmol/L (ref 3.5–5.1)
Sodium: 134 mmol/L — ABNORMAL LOW (ref 135–145)

## 2022-11-05 LAB — MAGNESIUM: Magnesium: 2.3 mg/dL (ref 1.7–2.4)

## 2022-11-05 MED ORDER — ORAL CARE MOUTH RINSE: 15.0000 mL | OROMUCOSAL | Status: DC | PRN

## 2022-11-05 MED ORDER — ORAL CARE MOUTH RINSE
15.0000 mL | OROMUCOSAL | Status: DC
  Administered 2022-11-05 – 2022-11-12 (×24): 15 mL via OROMUCOSAL

## 2022-11-05 NOTE — TOC Progression Note (Signed)
Transition of Care [facility]) - Progression Note    Patient Details  Name: Gary [name] MRN: [id number] Date of Birth: 1955/11/10  Transition of Care [facility]) CM/SW Contact  [name], RN Phone Number: 11/05/2022, 8:26 AM  Clinical Narrative:    NCM left vm for patient's sister , Stanton Kidney for return call.   Expected Discharge Plan: Home w Home Health Services Barriers to Discharge: Continued Medical Work up  Expected Discharge Plan and Services In-house Referral: Clinical Social Work Discharge Planning Services: CM Consult Post Acute Care Choice: Home Health Living arrangements for the past 2 months: Single Family Home                 DME Arranged: N/A DME Agency: NA       HH Arranged: PT, OT HH Agency: [facility] Date HH Agency Contacted: 10/31/22 Time HH Agency Contacted: 1300 Representative spoke with at [facility] Agency: Amy   Social Determinants of Health (SDOH) Interventions SDOH Screenings   Food Insecurity: No Food Insecurity (10/18/2022)  Housing: Patient Declined (10/18/2022)  Transportation Needs: No Transportation Needs (10/18/2022)  Recent Concern: Transportation Needs - Unmet Transportation Needs (08/13/2022)  Utilities: Not At Risk (10/18/2022)  Depression (PHQ2-9): Low Risk  (06/07/2021)  Financial Resource Strain: Medium Risk (05/03/2017)  Physical Activity: Insufficiently Active (05/03/2017)  Social Connections: Unknown (08/06/2022)   Received from [facility]  Stress: No Stress Concern Present (05/03/2017)  Tobacco Use: Medium Risk (10/16/2022)    Readmission Risk Interventions    12/31/2021    8:58 AM 12/20/2021    4:21 PM 05/04/2021    1:26 PM  Readmission Risk Prevention Plan  Transportation Screening Complete Complete Complete  Medication Review Oceanographer) Complete Complete Complete  PCP or Specialist appointment within 3-5 days of discharge Complete Complete Complete  HRI or Home Care Consult Complete Complete Complete   SW Recovery Care/Counseling Consult Complete Complete Complete  Palliative Care Screening Complete Complete Not Applicable  Skilled Nursing Facility Not Applicable Not Applicable Not Applicable

## 2022-11-05 NOTE — TOC Progression Note (Addendum)
Transition of Care [facility]) - Progression Note    Patient Details  Name: Gary [name] MRN: [id number] Date of Birth: 12-Mar-1956  Transition of Care [facility]) CM/SW Contact  Janae Bridgeman, RN Phone Number: 11/05/2022, 3:04 PM  Clinical Narrative:    CM spoke with the patient at the bedside and [facility] met with the patient's girlfriend at the home today for home assessment at 12 noon today.  I spoke with Iona Beard, liaison at [facility] and he plans to start insurance authorization for private duty today - that may take atleast 2 weeks to get insurance authorization.  Clinicals were faxed to [facility] healthcare at [phone] to start authorization.   The patient want to go to LTAC in the meantime and has requested [facility] first - I let a message wit Katrinka Blazing, CM with [facility] to review.  Attending MD Dr. Dartha Lodge is aware and will update his progress notes for LTAC appropriateness.  Patient was agreeable to go to [facility] as second choice.  [name] did not answer the phone when I attempted to call her bu the patient called and the girlfriend told the patient that he could not come home and she was unable to provide care unless [employer] was in place for private duty nursing.  Patient and attending MD are aware.  11/05/22 1550 - CM spoke with attending physician and patient is not willing to go to [facility] now.  If patient is unable to receive bed offer from [facility] then patient will need to wait on insurance authorization through [facility] to be able to return home with private duty services.   Expected Discharge Plan: Long Term Acute Care (LTAC) Barriers to Discharge: Continued Medical Work up  Expected Discharge Plan and Services In-house Referral: Clinical Social Work Discharge Planning Services: CM Consult Post Acute Care Choice: Long Term Acute Care (LTAC) (LTAC versus home with private  duty RN) Living arrangements for the past 2 months: Single Family Home                 DME Arranged: N/A DME Agency: NA Date DME Agency Contacted: 11/05/22 Time DME Agency Contacted: [phone] Representative spoke with at DME Agency: Kai Levins - requested that [employer] go to home to move hospital bed to living room instead of bedroom HH Arranged: PT, OT HH Agency: [facility] Date [facility] Agency Contacted: 11/05/22 Time HH Agency Contacted: [phone] Representative spoke with at [facility] Agency: [facility] declined, [facility] declined   Social Determinants of Health (SDOH) Interventions SDOH Screenings   Food Insecurity: No Food Insecurity (10/18/2022)  Housing: Patient Declined (10/18/2022)  Transportation Needs: No Transportation Needs (10/18/2022)  Recent Concern: Transportation Needs - Unmet Transportation Needs (08/13/2022)  Utilities: Not At Risk (10/18/2022)  Depression (PHQ2-9): Low Risk  (06/07/2021)  Financial Resource Strain: Medium Risk (05/03/2017)  Physical Activity: Insufficiently Active (05/03/2017)  Social Connections: Unknown (08/06/2022)   [facility] from [facility]  Stress: No Stress Concern Present (05/03/2017)  Tobacco Use: Medium Risk (10/16/2022)    Readmission Risk Interventions    11/05/2022    2:59 PM 11/05/2022   12:16 PM 12/31/2021    8:58 AM  Readmission Risk Prevention Plan  Transportation Screening Complete Complete Complete  Medication Review Oceanographer) Complete Complete Complete  PCP or Specialist appointment within 3-5 days of discharge Complete Complete Complete  HRI or Home Care Consult Complete Complete Complete  SW Recovery Care/Counseling Consult Complete Complete  Complete  Palliative Care Screening Complete Complete Complete  Skilled Nursing Facility Not Applicable Not Applicable Not Applicable

## 2022-11-05 NOTE — Progress Notes (Deleted)
Pr has refused bedside SPO2 monitoring, cardiac telemetry, and trach care. Pt has been educated on the importance of all interventions and without these independent interventions used collectively the Pt is at risk for bodily harm and or death. Nursing plan of care ongoing.

## 2022-11-05 NOTE — Progress Notes (Signed)
Patient requesting nurse presence urgently. This nurse reported to bedside and patient asked, "Why did you take so damn long?" This nurse informed patient I was assisting another patient, NA was already in the room. Patient states, "You need to call the social worker I need to sign out." Patient was noted to have no physical distress. This nurse paged MD, social worker and case manager per patient request. Patient demands this nurse give him phone number to [facility] and that this nurse assist in arranging transport. This nurse informed patient that social worker will arrange transport once DC plan has been made with MD once he is medically stable. Patient states, "I can do everything you are doing here for myself at my house." This nurse informed him that the physician orders those things with social worker and case manager and they follow up with patient. He has already seen case manager this morning. Marcelino Duster, RN, case manager came to beside to inform him of home health services she has arranged. Marcelino Duster also reinforced that the MD will have to discharge him and he is the one he can discuss if he is medically stable for DC. MD made aware.

## 2022-11-05 NOTE — Progress Notes (Signed)
This nurse was in with patient at 0835 for AM assessment and medications. Patient tolerated O2 at 6L well throughout the night. Patient refused BiPap during that time. Patient called out stating he was "suffocating." He demanded another nurse increase his O2 to 10L. This nurse checked patient SpO2 and he was at 97%. He did not appear cyanotic, in respiratory distress and airway patent. Patient stated he felt that he had something stuck in his throat. This nurse offered trach care and suctioning to alleviate this feeling. Patient refused and demanded to speak to respiratory therapist for her to return to the bedside. This nurse proceeded to explain that his oxygen needed to be weaned down. He refused for this nurse to titrate below 9L. He states "you don't know what you are talking about it." This nurse called RT for bedside assessment. Patient was left with call light and SpO2 at 96%.

## 2022-11-05 NOTE — Progress Notes (Signed)
Occupational Therapy Treatment Patient Details Name: Gary [name] MRN: [id number] DOB: October 05, 1955 Today's Date: 11/05/2022   History of present illness 67 y.o. male presents to [facility] hospital on 10/16/2022 with reports of SOB and chest pain. Pt was in ED earlier this morning and elected to discharge home, however in route to home decided to return home for placement due to inadequate caregiver support. Of note, pt has had 4 admissions in the last 6 months, and 9 admissions in the last year. PMH includes obesity, hypoxia, respiratory failure with tracheostomy, COPD, GIB, HFpEF.   OT comments  Pt. Seen for skilled OT treatment session.  Pt. Requiring max cues and encouragement for safety and sequencing during bed mobility and transfer.  Difficulty with receiving therapeutic instruction vs. His plans for transfer. Extensive education and attempts to incorporate/simulate home env. In prep for home.  Pt. Able to complete bed mobility, sit/stand, and short distance ambulation for transfers with heavy reliance on adj. Bed to highest height level and using recliner with wheels and lock/un lock.  Pt. Reports utilizing these features is okay as his bed at home does the same and reports having a higher chair at home.  Pt. Eager for d/c home with [facility] when able.     Recommendations for follow up therapy are one component of a multi-disciplinary discharge planning process, led by the attending physician.  Recommendations may be updated based on patient status, additional functional criteria and insurance authorization.    Assistance Recommended at Discharge Frequent or constant Supervision/Assistance  Patient can return home with the following  Two people to help with walking and/or transfers;A lot of help with bathing/dressing/bathroom;Assistance with cooking/housework;Direct supervision/assist for financial management;Assist for transportation;Help with stairs or ramp for entrance   Equipment Recommendations  None  recommended by OT    Recommendations for Other Services      Precautions / Restrictions Precautions Precautions: Fall Precaution Comments: trach collar (8L/40% at rest, 10L/40% gait given ambulation kit settings) Restrictions RLE Weight Bearing: Weight bearing as tolerated       Mobility Bed Mobility Overal bed mobility: Needs Assistance Bed Mobility: Supine to Sit     Supine to sit: Min guard, HOB elevated     General bed mobility comments: hob elevated, exit to the L. pt. wanted shoes on prior to oob but then requesting assistance moving bles because his shoes were getting stuck on the sheet. encouragement to try and move his bles on his own. with encouragement pt. was able to manage bles oob and sit un supported eob    Transfers Overall transfer level: Needs assistance Equipment used: Rolling walker (2 wheels) Transfers: Sit to/from Stand, Bed to chair/wheelchair/BSC Sit to Stand: Min guard, From elevated surface     Step pivot transfers: Min guard, From elevated surface     General transfer comment: requests bed elevated to nearly standing     Balance                                           ADL either performed or assessed with clinical judgement   ADL Overall ADL's : Needs assistance/impaired                         Toilet Transfer: Min guard;Cueing for sequencing;Ambulation Toilet Transfer Details (indicate cue type and reason): simulated/observed with eob and approx. 5 steps  to recliner across the room.  bed significantly elevated to pt. specifications         Functional mobility during ADLs: Min guard General ADL Comments: pt. with specific ways he wants bed and chair positioned for transfer oob. not receptive to therapist asst. guidance and sequencing cues.  reviewed needs to simulate home env. for safe return home but pt. would not attempt my transfer, chair, or any other positioning recommendations.  wanted chair across the  room then unlock it and roll it back to where he wants it.  reviewed chairs at home would not have this availability and that is why i wanted him to attempt the transfer to chair as i was recommending. pt. would not attempt so we set the chair and items to his request.  2 cnas present and were assisting with bed/linen change and asssitance with his pure wik    Extremity/Trunk Assessment              Vision       Perception     Praxis      Cognition Arousal/Alertness: Awake/alert Behavior During Therapy: Agitated Overall Cognitive Status: Within Functional Limits for tasks assessed                                 General Comments: pt. with flucuations of agitation today with myself and the 2 CNAs present, apologetic at the end of session but still going in/out of agitation        Exercises      Shoulder Instructions       General Comments      Pertinent Vitals/ Pain       Pain Assessment Pain Assessment: No/denies pain  Home Living                                          Prior Functioning/Environment              Frequency  Min 1X/week        Progress Toward Goals  OT Goals(current goals can now be found in the care plan section)  Progress towards OT goals: Progressing toward goals     Plan Discharge plan remains appropriate    Co-evaluation                 AM-PAC OT "6 Clicks" Daily Activity     Outcome Measure   Help from another person eating meals?: None Help from another person taking care of personal grooming?: A Little Help from another person toileting, which includes using toliet, bedpan, or urinal?: Total Help from another person bathing (including washing, rinsing, drying)?: A Lot Help from another person to put on and taking off regular upper body clothing?: A Little Help from another person to put on and taking off regular lower body clothing?: Total 6 Click Score: 14    End of Session  Equipment Utilized During Treatment: Oxygen;Rolling walker (2 wheels)  OT Visit Diagnosis: Unsteadiness on feet (R26.81);Other abnormalities of gait and mobility (R26.89);Muscle weakness (generalized) (M62.81);Adult, failure to thrive (R62.7)   Activity Tolerance Patient tolerated treatment well   Patient Left in chair;with call bell/phone within reach   Nurse Communication Other (comment) (updated rn on tx session with cnas present the entire session also.)        Time: [phone]  OT Time Calculation (min): 26 min  Charges: OT General Charges $OT Visit: 1 Visit OT Treatments $Self Care/Home Management : 23-37 mins  Boneta Lucks, COTA/L Acute Rehabilitation [phone]   Alessandra Bevels Lorraine-COTA/L 11/05/2022, 8:33 AM

## 2022-11-05 NOTE — Progress Notes (Signed)
Patient noted to have very flat affect. This nurse had patient speak to social work, MD, case Production designer, theatre/television/film, Museum/gallery curator for unit and chaplain. Patient states he feels his needs were addressed and that he spoke with Dr. Dartha Lodge and is agreeable to [facility] to [facility]. Patient has no other concerns at this time.

## 2022-11-05 NOTE — Progress Notes (Signed)
This chaplain responded to PMT NP-Michelle's consult for spiritual care and communicating the Pt. choice of HCPOA.  The Pt. confirms with the chaplain he has documentation of HCPOA at home. The Pt. adds his sister-[name] is trying to locate the document.  The Pt. is appreciative of the chaplain's visit and reflective listening. The Pt. shares "having someone listen helps him calm down." The Pt. describes himself as "unable to stay in this room any longer."  CM-Michelle updates the Pt. on his choices for rehab. With this news, the chaplain is able to help the Pt. find patience and peace through his spiritual beliefs, while he waits for an answer. The Pt. adjusted himself in the chair and the chaplain added pillows for comfort. The Pt. is hopeful the care team will have time to listen to him tonight.  This chaplain is available for F/U spiritual care as needed.  Chaplain Stephanie Acre [phone]

## 2022-11-05 NOTE — Progress Notes (Signed)
Physical Therapy Treatment Patient Details Name: Gary [name] MRN: [id number] DOB: 1955-05-14 Today's Date: 11/05/2022   History of Present Illness 67 y.o. male presents to [facility] hospital on 10/16/2022 with reports of SOB and chest pain. Pt was in ED earlier this morning and elected to discharge home, however in route to home decided to return home for placement due to inadequate caregiver support. Of note, pt has had 4 admissions in the last 6 months, and 9 admissions in the last year. PMH includes obesity, hypoxia, respiratory failure with tracheostomy, COPD, GIB, HFpEF.    PT Comments  Pt received in recliner. Unwilling to attempt sit to stand, despite being offered/education on multiple methods. Insisted on maximove to EOB, then sit to stand from highly elevated bed to proceed with hallway ambulation. He required min guard assist STS with RW from elevated surface, and min guard assist +2 chair follow amb 38' with bari RW. Mobilized on 10L 31% FiO2 via TC. SpO2 94%. Max HR 119. Pt very anxious upon return to bed stating he wasn't getting air. Assured pt his SpO2 was good and relaxation/decreased RR encouraged. Pt supine in bed at end of session returned to 9L at rest.       Assistance Recommended at Discharge Frequent or constant Supervision/Assistance  If plan is discharge home, recommend the following:  Can travel by private vehicle    A lot of help with walking and/or transfers;A lot of help with bathing/dressing/bathroom;Assistance with cooking/housework;Assist for transportation;Help with stairs or ramp for entrance   No  Equipment Recommendations  Other (comment) (requesting lift chair)    Recommendations for Other Services       Precautions / Restrictions Precautions Precautions: Fall;Other (comment) Precaution Comments: trach collar Restrictions Weight Bearing Restrictions: No RLE Weight Bearing: Weight bearing as tolerated     Mobility  Bed Mobility Overal bed mobility:  Needs Assistance Bed Mobility: Supine to Sit, Sit to Supine     Supine to sit: Min assist Sit to supine: Min assist   General bed mobility comments: +rail, assist to elevate trunk, assist with BLE back to bed, increased time    Transfers Overall transfer level: Needs assistance Equipment used: Rolling walker (2 wheels) Transfers: Bed to chair/wheelchair/BSC, Sit to/from Stand Sit to Stand: Min guard, From elevated surface           General transfer comment: maximove recliner to bed due to pt unable to stand from low surface. Min guard sit to stand from highly elevated bed Transfer via Lift Equipment: Maximove  Ambulation/Gait Ambulation/Gait assistance: +2 safety/equipment, Min guard Gait Distance (Feet): 65 Feet Assistive device: Rolling walker (2 wheels) Gait Pattern/deviations: Step-through pattern, Wide base of support, Decreased stride length Gait velocity: decreased Gait velocity interpretation: <1.31 ft/sec, indicative of household ambulator   General Gait Details: Chair follow for safety.  Amb on 10L 31%FiO2 via TC. SpO2 94%. Max HR 119.   Stairs             Wheelchair Mobility     Tilt Bed    Modified Rankin (Stroke Patients Only)       Balance Overall balance assessment: Needs assistance Sitting-balance support: Feet supported, No upper extremity supported Sitting balance-[name] Scale: Good     Standing balance support: Bilateral upper extremity supported, Reliant on assistive device for balance, During functional activity Standing balance-[name] Scale: Poor  Cognition Arousal/Alertness: Awake/alert Behavior During Therapy: Agitated, Anxious                                   General Comments: Easily agitated. Demanding. Wants to only do things his way. Not receptive to education or instruction.        Exercises      General Comments General comments (skin integrity, edema, etc.):  SpO2 95% at rest on 9L 30% FiO2      Pertinent Vitals/Pain Pain Assessment Pain Assessment: No/denies pain    Home Living                          Prior Function            PT Goals (current goals can now be found in the care plan section) Acute Rehab PT Goals Patient Stated Goal: get stronger and go home Progress towards PT goals: Progressing toward goals    Frequency    Min 1X/week      PT Plan Current plan remains appropriate    Co-evaluation              AM-PAC PT "6 Clicks" Mobility   Outcome Measure  Help needed turning from your back to your side while in a flat bed without using bedrails?: A Little Help needed moving from lying on your back to sitting on the side of a flat bed without using bedrails?: A Little Help needed moving to and from a bed to a chair (including a wheelchair)?: A Little Help needed standing up from a chair using your arms (e.g., wheelchair or bedside chair)?: A Little Help needed to walk in hospital room?: A Little Help needed climbing 3-5 steps with a railing? : Total 6 Click Score: 16    End of Session Equipment Utilized During Treatment: Oxygen Activity Tolerance: Treatment limited secondary to agitation Patient left: in bed;with call bell/phone within reach Nurse Communication: Mobility status PT Visit Diagnosis: Muscle weakness (generalized) (M62.81);Difficulty in walking, not elsewhere classified (R26.2)     Time: 4132-4401 PT Time Calculation (min) (ACUTE ONLY): 44 min  Charges:    $Gait Training: 8-22 mins $Therapeutic Activity: 23-37 mins PT General Charges $$ ACUTE PT VISIT: 1 Visit                     Ferd Glassing., PT  Office # [phone]    Ilda Foil 11/05/2022, 11:32 AM

## 2022-11-05 NOTE — TOC Progression Note (Signed)
Transition of Care [facility]) - Progression Note    Patient Details  Name: Gary [name] MRN: [id number] Date of Birth: [birth year]  Transition of [facility]) CM/SW Contact  Gary Bridgeman, RN Phone Number: 11/05/2022, 12:16 PM  Clinical Narrative:    CM met with the patient at the bedside to further discuss TOC needs to return home once he is medically stable to discharge.  The patient declined returning to [facility] for care.  The patient lives at his apartment with his girlfriend, Gary [name] and plans to return home.  The patient is sitting up in the hospital bed and states that he was unable to wean to 6L/min via trach collar and oxygen was increased per nursing/MD.    [facility] is meeting with the patient's girlfriend, Gary [name] at the home today for a home assessment and possible provision of private duty at the home.  The patient states that his girlfriend provides 24 hour care at the home.   Patient has Hospital bed, suction equipment, home oxygen, humidity at home, trach supplies at the home already through [employer].  The patient requested that his home bed be moved to the living room so that he can mobilize better in the apartment.  I called [employer] and spoke with Gary [name], CM with [employer] and asked that hospital bed be moved in the home and asked that humidification be hooked up to his oxygen as patient requested.  Patient is pending medical stability to be able to return home with home health (set up with [place])  and private duty nursing care - pending at this time.   Expected Discharge Plan: Home w Home Health Services Barriers to Discharge: Continued Medical Work up  Expected Discharge Plan and Services In-house Referral: Clinical Social Work Discharge Planning Services: CM Consult Post Acute Care Choice: Home Health Living arrangements for the past 2 months: Single Family Home                 DME Arranged: N/A DME Agency: NA       HH Arranged: PT, OT HH Agency:  [facility] Date HH Agency Contacted: 10/31/22 Time HH Agency Contacted: 1300 Representative spoke with at [facility] Agency: Gary [name]   Social Determinants of Health (SDOH) Interventions SDOH Screenings   Food Insecurity: No Food Insecurity (10/18/2022)  Housing: Patient Declined (10/18/2022)  Transportation Needs: No Transportation Needs (10/18/2022)  Recent Concern: Transportation Needs - Unmet Transportation Needs (08/13/2022)  Utilities: Not At Risk (10/18/2022)  Depression (PHQ2-9): Low Risk  (06/07/2021)  Financial Resource Strain: Medium Risk (05/03/2017)  Physical Activity: Insufficiently Active (05/03/2017)  Social Connections: Unknown (08/06/2022)   Received from [facility]  Stress: No Stress Concern Present (05/03/2017)  Tobacco Use: Medium Risk (10/16/2022)    Readmission Risk Interventions    11/05/2022   12:16 PM 12/31/2021    8:58 AM 12/20/2021    4:21 PM  Readmission Risk Prevention Plan  Transportation Screening Complete Complete Complete  Medication Review Oceanographer) Complete Complete Complete  PCP or Specialist appointment within 3-5 days of discharge Complete Complete Complete  HRI or Home Care Consult Complete Complete Complete  SW Recovery Care/Counseling Consult Complete Complete Complete  Palliative Care Screening Complete Complete Complete  Skilled Nursing Facility Not Applicable Not Applicable Not Applicable

## 2022-11-05 NOTE — Progress Notes (Signed)
Progress Note   Patient: Gary [name] WJX:[id number] DOB: [birth year] DOA: 10/16/2022     19 DOS: the patient was seen and examined on 11/05/2022   Brief hospital course: Patient is a 67 year old male, morbidly obese, with past medical history significant for diverticulosis, OSA, OHS, hypertension, hyperlipidemia, GERD, substance use, depression, anxiety, anemia, COPD, chronic respiratory failure with hypoxia, diastolic congestive heart failure and CKD 3A.  Patient was admitted with acute on chronic respiratory failure, and patient has continued to require higher than usual supplemental oxygen.  Oxygen requirements ranges from 8 L to 10 L.  Patient is s/p tracheostomy placement that can be capped.  Patient's respiratory status remains unstable, to the extent that the patient may benefit from further care at the long-term acute care facility.  Patient's oxygen requirements will need to be weaned down to his baseline level.   11/04/2022: Patient seen.  Patient remained stable.  Titrate supplemental oxygen to keep O2 sat greater than or equal to 91%.  Leg edema has improved.  Optimize volume treatment.  Check renal panel today.  11/05/2022: Patient seen.  Patient continues to need significant amount of supplemental oxygen (lower high-end than baseline).  Patient is currently on 10 L of supplemental oxygen.  Assessment and Plan: Acute on chronic diastolic CHF (congestive heart failure) ([name]) -Echocardiogram with preserved LV systolic function 60 to 70%, mild LVH, RV systolic function preserved, no significant valvular disease. Poor windows.  -Continue diuresis with oral diuretics using torsemide 60mg  BID -Use metolazone intermittently. -Continue SGLT2 inhibitor.     -Following strict intake and output; continue to follow daily weights and low-sodium diet.  Acute on chronic hypoxemic respiratory failure: -At baseline, patient is on 6 L supplemental oxygen. -Patient is status trach placement that can be  kappa. -Patient is morbidly obese. -Patient is requiring higher than usual amount of supplemental oxygen (10 L/min). -Consider disposition to an LTAC facility to help when patient off this level supplemental oxygen  COPD (chronic obstructive pulmonary disease) ([name]) -Continue bronchodilator therapy and inhaled corticosteroids.  -Patient on chronic prednisone 10 mg daily.  -Continue trach care.  -continue PT and OT.  HTN (hypertension) -Continue blood pressure support with midodrine. -Continue to follow vital signs. -Blood pressure stable.  GERD without esophagitis -Continue with PPI -Lifestyle changes discussed with patient.  Morbid obesity with BMI of 50.0-59.9, adult ([name]) -Body mass index is 49.91 kg/m.  -Low-calorie diet and portion control discussed with patient.  History of DVT (deep vein thrombosis) -Continue anticoagulation with apixaban.  -No signs of overt bleeding. -Reports no lower extremity pain currently.  Iron deficiency anemia -Hgb has been stable.  -No signs of overt bleeding appreciated.  Hyperlipidemia -Continue statin therapy.  -Heart healthy diet discussed with patient.  Chronic colitis -Continue treatment with mesalamine.  -No signs of acute exacerbation.  -Patient reports no nausea, no vomiting and no abdominal pain at the moment. -Tolerating diet without problems.  AKI (acute kidney injury) ([name]) -Continue to closely follow renal function trend -Creatinine fluctuating with a creatinine in the 1.3-1.4 at the moment; patient renal function has been as high as 1.7--1.8 in the past. -Continue to maintain adequate oral hydration -Continue to avoid other nephrotoxic agents. -Latest cr 1.86  -Repeat basic metabolic panel in a.m. -Continue to follow a strict I's and O's and urine output.  Subjective: -No new changes. -No new symptoms endorsed. -Requiring high amount of supplemental oxygen.  Physical Exam: Vitals:   11/05/22 2130 11/05/22  1127 11/05/22 1541 11/05/22 1611  BP:    104/68  Pulse:  73  94  Resp:  (!) 23  16  Temp:    97.9 F (36.6 C)  TempSrc:    Oral  SpO2: 97% 93% 93% 93%  Weight:      Height:       General exam: Patient is morbidly obese.  Patient is s/p tracheostomy.  The trach is capped.  [name] Respiratory system: Decreased breath sounds  Cardiovascular system: S1-S2.   Gastrointestinal system: Abdomen is obese Central nervous system: Awake and alert. Extremities: Bilateral lower extremity edema has improved significantly.   Family Communication:    Disposition: Status is: Inpatient Remains inpatient appropriate because: pending disposition, he has declined [facility] (reported bad experience in the past); he was amenable for other LTAC's options if any.   Planned Discharge Destination: to be determined.    Author: Barnetta Chapel, MD 11/05/2022 7:29 PM  For on call review [email or web address].

## 2022-11-06 DIAGNOSIS — N179 Acute kidney failure, unspecified: Secondary | ICD-10-CM | POA: Diagnosis not present

## 2022-11-06 MED ORDER — HYDROXYZINE HCL 25 MG PO TABS
25.0000 mg | ORAL_TABLET | Freq: Once | ORAL | Status: AC
  Administered 2022-11-07: 25 mg via ORAL
  Filled 2022-11-06: qty 1

## 2022-11-06 NOTE — Progress Notes (Signed)
RT increased O2 flow from 6L to 8L trach collar per pt. Pt stated he felt short of breath on 6L. Pt's VSS w/SpO2 of 94%

## 2022-11-06 NOTE — Plan of Care (Signed)

## 2022-11-06 NOTE — TOC Progression Note (Addendum)
Transition of Care [facility]) - Progression Note    Patient Details  Name: WESTYN DRIGGERS MRN: [id number] Date of Birth: [birth year]  Transition of Care [facility]) CM/SW Contact  Janae Bridgeman, RN Phone Number: 11/06/2022, 8:51 AM  Clinical Narrative:    CM called and spoke with Claudean Severance, CM with [facility] and she is willing to evaluate the patient's clinicals to see if he would meet criteria for an available bed at the facility.  The patient was agreeable yesterday to admit to the LTAC if insurance approved while he waits for insurance approval for private duty nursing at the home through [facility] that may take up to 2 weeks to receive insurance authorization through [facility].  I called and spoke with Iona Beard, CM at [facility] and provided my email address to have Personal Care document co-signed by the physician today to start insurance authorization for personal care services.  The patient has all DME at the home to return but his girlfriend stated yesterday on the phone that she was unable to provide care for him unless personal care services were in place that may take up to 2 weeks to obtain.  Attending physician was aware yesterday during out numerous conversations with the patient at the bedside for [facility] discussions.  11/06/22 1014 - CM spoke with Dr. Sherryll Burger this morning to discuss pending TOC needs for the patient.  Personal Care services documents were signed by Dr. Sherryll Burger and emailed to Iona Beard.   Expected Discharge Plan: Long Term Acute Care (LTAC) Barriers to Discharge: Continued Medical Work up  Expected Discharge Plan and Services In-house Referral: Clinical Social Work Discharge Planning Services: CM Consult Post Acute Care Choice: Long Term Acute Care (LTAC) (LTAC versus home with private duty RN) Living arrangements for the past 2 months: Single Family Home                 DME Arranged: N/A DME Agency: NA Date DME Agency  Contacted: 11/05/22 Time DME Agency Contacted: [phone] Representative spoke with at DME Agency: Kai Levins - requested that [employer] go to home to move hospital bed to living room instead of bedroom HH Arranged: PT, OT HH Agency: [facility] Date [facility] Agency Contacted: 11/05/22 Time HH Agency Contacted: [phone] Representative spoke with at [facility] Agency: [facility] declined, [facility] declined   Social Determinants of Health (SDOH) Interventions SDOH Screenings   Food Insecurity: No Food Insecurity (10/18/2022)  Housing: Patient Declined (10/18/2022)  Transportation Needs: No Transportation Needs (10/18/2022)  Recent Concern: Transportation Needs - Unmet Transportation Needs (08/13/2022)  Utilities: Not At Risk (10/18/2022)  Depression (PHQ2-9): Low Risk  (06/07/2021)  Financial Resource Strain: Medium Risk (05/03/2017)  Physical Activity: Insufficiently Active (05/03/2017)  Social Connections: Unknown (08/06/2022)   [facility] from [facility]  Stress: No Stress Concern Present (05/03/2017)  Tobacco Use: Medium Risk (10/16/2022)    Readmission Risk Interventions    11/05/2022    2:59 PM 11/05/2022   12:16 PM 12/31/2021    8:58 AM  Readmission Risk Prevention Plan  Transportation Screening Complete Complete Complete  Medication Review Oceanographer) Complete Complete Complete  PCP or Specialist appointment within 3-5 days of discharge Complete Complete Complete  HRI or Home Care Consult Complete Complete Complete  SW Recovery Care/Counseling Consult Complete Complete Complete  Palliative Care Screening Complete Complete Complete  Skilled Nursing Facility Not Applicable Not Applicable Not Applicable

## 2022-11-06 NOTE — Progress Notes (Addendum)
PROGRESS NOTE    Gary [name]  WGN:[id number] DOB: 01/12/1956 DOA: 10/16/2022 PCP: Eloisa Northern, MD   Brief Narrative:    Patient is a 67 year old male, morbidly obese, with past medical history significant for diverticulosis, OSA, OHS, hypertension, hyperlipidemia, GERD, substance use, depression, anxiety, anemia, COPD, chronic respiratory failure with hypoxia, diastolic congestive heart failure and CKD 3A.  Patient was admitted with acute on chronic respiratory failure, and patient has continued to require higher than usual supplemental oxygen.  Oxygen requirements ranges from 8 L to 10 L.  Patient is s/p tracheostomy placement that can be capped.  Patient's respiratory status remains unstable, to the extent that the patient may benefit from further care at the long-term acute care facility.  Patient's oxygen requirements will need to be weaned down to his baseline level.  Patient currently awaiting LTAC placement versus home with home health services.  Continues to have high oxygen requirements.  Patient is an ideal candidate for LTAC.  Assessment & Plan:   Active Problems:   Acute on chronic diastolic CHF (congestive heart failure) ([name])   COPD (chronic obstructive pulmonary disease) ([name])   HTN (hypertension)   Morbid obesity with BMI of 50.0-59.9, adult ([name])   GERD without esophagitis   Iron deficiency anemia   History of DVT (deep vein thrombosis)   Hyperlipidemia   Polysubstance abuse ([name])   Chronic colitis   AKI (acute kidney injury) ([name])   Acute on chronic respiratory failure ([name])  Assessment and Plan:   Acute on chronic diastolic CHF (congestive heart failure) ([name]) -Echocardiogram with preserved LV systolic function 60 to 70%, mild LVH, RV systolic function preserved, no significant valvular disease. Poor windows.  -Continue diuresis with oral diuretics using torsemide 60mg  BID -Use metolazone intermittently. -Continue SGLT2 inhibitor.     -Following strict intake  and output; continue to follow daily weights and low-sodium diet.   Acute on chronic hypoxemic respiratory failure: -At baseline, patient is on 6 L supplemental oxygen. -Patient is status trach placement that can be kappa. -Patient is morbidly obese. -Patient is requiring higher than usual amount of supplemental oxygen (10 L/min). -Consider disposition to an LTAC facility to help when patient off this level supplemental oxygen   COPD (chronic obstructive pulmonary disease) ([name]) -Continue bronchodilator therapy and inhaled corticosteroids.  -Patient on chronic prednisone 10 mg daily.  -Continue trach care.  -continue PT and OT.   HTN (hypertension) -Continue blood pressure support with midodrine. -Continue to follow vital signs. -Blood pressure stable.   GERD without esophagitis -Continue with PPI -Lifestyle changes discussed with patient.   Morbid obesity with BMI of 50.0-59.9, adult ([name]) -Body mass index is 49.91 kg/m.  -Low-calorie diet and portion control discussed with patient.   History of DVT (deep vein thrombosis) -Continue anticoagulation with apixaban.  -No signs of overt bleeding. -Reports no lower extremity pain currently.   Iron deficiency anemia -Hgb has been stable.  -No signs of overt bleeding appreciated.   Hyperlipidemia -Continue statin therapy.  -Heart healthy diet discussed with patient.   Chronic colitis -Continue treatment with mesalamine.  -No signs of acute exacerbation.  -Patient reports no nausea, no vomiting and no abdominal pain at the moment. -Tolerating diet without problems.   AKI (acute kidney injury) ([name]) -Continue to closely follow renal function trend -Creatinine fluctuating with a creatinine in the 1.3-1.4 at the moment; patient renal function has been as high as 1.7--1.8 in the past. -Continue to maintain adequate oral hydration -Continue to avoid other nephrotoxic  agents. -Latest cr 1.86  -Repeat basic metabolic panel in  a.m. -Continue to follow a strict I's and O's and urine output.    DVT prophylaxis:Eliquis Code Status: Full Family Communication: None at bedside Disposition Plan: Awaiting further placement options per TOC Status is: Inpatient Remains inpatient appropriate because: Continues to require IV medications.   Consultants:  Palliative care  Procedures:  None  Antimicrobials:  None  Subjective: Patient seen and evaluated today with no new acute complaints or concerns. No acute concerns or events noted overnight.  He is complaining of some back pain and would like to set up into a chair.  Objective: Vitals:   11/06/22 0212 11/06/22 0616 11/06/22 0734 11/06/22 0846  BP:   115/72   Pulse:   (!) 46 94  Resp:   18 20  Temp:   98.3 F (36.8 C)   TempSrc:      SpO2:  95% 93% 94%  Weight: (!) 189.4 kg     Height:        Intake/Output Summary (Last 24 hours) at 11/06/2022 1153 Last data filed at 11/06/2022 0519 Gross per 24 hour  Intake 500 ml  Output 1250 ml  Net -750 ml   Filed Weights   11/04/22 0558 11/05/22 0431 11/06/22 0212  Weight: (!) 186.7 kg (!) 186 kg (!) 189.4 kg    Examination:  General exam: Appears calm and comfortable  Respiratory system: Clear to auscultation. Respiratory effort normal.  Trach with trach collar and oxygen supplementation. Cardiovascular system: S1 & S2 heard, RRR.  Gastrointestinal system: Abdomen is soft Central nervous system: Alert and awake Extremities: No edema Skin: No significant lesions noted Psychiatry: Flat affect.    Data Reviewed: I have personally reviewed following labs and imaging studies  CBC: Recent Labs  Lab 10/31/22 0501  WBC 7.4  HGB 10.5*  HCT 40.2  MCV 80.4  PLT 226   Basic Metabolic Panel: Recent Labs  Lab 11/01/22 0827 11/03/22 0530 11/03/22 1400 11/04/22 1359 11/05/22 0850  NA 135 138 137 135 134*  K 3.0* 4.1 4.3 4.2 3.5  CL 92* 94* 93* 87* 86*  CO2 36* 32 33* 30 33*  GLUCOSE 176* 138*  150* 172* 165*  BUN 35* 39* 35* 41* 52*  CREATININE 1.27* 1.45* 1.44* 1.62* 1.87*  CALCIUM 8.1* 8.0* 8.3* 8.4* 8.3*  MG 2.3 2.3 2.3 2.3 2.3  PHOS 3.9 4.5 4.2 4.7* 5.8*   GFR: Estimated Creatinine Clearance: 71.2 mL/min (A) (by C-G formula based on SCr of 1.87 mg/dL (H)). Liver Function Tests: Recent Labs  Lab 11/01/22 0827 11/03/22 0530 11/03/22 1400 11/04/22 1359 11/05/22 0850  ALBUMIN 2.9* 2.8* 3.0* 3.3* 3.5   No results for input(s): "LIPASE", "AMYLASE" in the last 168 hours. No results for input(s): "AMMONIA" in the last 168 hours. Coagulation Profile: No results for input(s): "INR", "PROTIME" in the last 168 hours. Cardiac Enzymes: No results for input(s): "CKTOTAL", "CKMB", "CKMBINDEX", "TROPONINI" in the last 168 hours. BNP (last 3 results) No results for input(s): "PROBNP" in the last 8760 hours. HbA1C: No results for input(s): "HGBA1C" in the last 72 hours. CBG: No results for input(s): "GLUCAP" in the last 168 hours. Lipid Profile: No results for input(s): "CHOL", "HDL", "LDLCALC", "TRIG", "CHOLHDL", "LDLDIRECT" in the last 72 hours. Thyroid Function Tests: No results for input(s): "TSH", "T4TOTAL", "FREET4", "T3FREE", "THYROIDAB" in the last 72 hours. Anemia Panel: No results for input(s): "VITAMINB12", "FOLATE", "FERRITIN", "TIBC", "IRON", "RETICCTPCT" in the last 72 hours. Sepsis Labs: No  results for input(s): "PROCALCITON", "LATICACIDVEN" in the last 168 hours.  No results found for this or any previous visit (from the past 240 hour(s)).       Radiology Studies: No results found.      Scheduled Meds:  apixaban  2.5 mg Oral BID   arformoterol  15 mcg Nebulization BID   budesonide (PULMICORT) nebulizer solution  0.5 mg Nebulization BID   dapagliflozin propanediol  10 mg Oral Daily   docusate sodium  100 mg Oral Daily   ferrous sulfate  325 mg Oral Q breakfast   melatonin  3 mg Oral QHS   mesalamine  2.4 g Oral BID   midodrine  10 mg Oral BID  WC   mouth rinse  15 mL Mouth Rinse 4 times per day   pantoprazole  40 mg Oral QHS   predniSONE  10 mg Oral Q breakfast   revefenacin  175 mcg Nebulization Daily   sodium chloride flush  3 mL Intravenous Q12H   torsemide  60 mg Oral BID     LOS: 20 days    Time spent: 35 minutes    Jorgina Binning Hoover Brunette, DO [facility]  If 7PM-7AM, please contact night-coverage [email or web address] 11/06/2022, 11:53 AM

## 2022-11-07 ENCOUNTER — Inpatient Hospital Stay (HOSPITAL_COMMUNITY): Payer: 59

## 2022-11-07 DIAGNOSIS — I10 Essential (primary) hypertension: Secondary | ICD-10-CM | POA: Diagnosis not present

## 2022-11-07 DIAGNOSIS — J441 Chronic obstructive pulmonary disease with (acute) exacerbation: Secondary | ICD-10-CM | POA: Diagnosis not present

## 2022-11-07 DIAGNOSIS — I5033 Acute on chronic diastolic (congestive) heart failure: Secondary | ICD-10-CM | POA: Diagnosis not present

## 2022-11-07 LAB — URINALYSIS, ROUTINE W REFLEX MICROSCOPIC
Bilirubin Urine: NEGATIVE
Glucose, UA: NEGATIVE mg/dL
Hgb urine dipstick: NEGATIVE
Ketones, ur: NEGATIVE mg/dL
Leukocytes,Ua: NEGATIVE
Nitrite: NEGATIVE
Protein, ur: NEGATIVE mg/dL
Specific Gravity, Urine: 1.013 (ref 1.005–1.030)
pH: 5 (ref 5.0–8.0)

## 2022-11-07 LAB — RENAL FUNCTION PANEL
Albumin: 3.5 g/dL (ref 3.5–5.0)
Anion gap: 13 (ref 5–15)
BUN: 76 mg/dL — ABNORMAL HIGH (ref 8–23)
CO2: 35 mmol/L — ABNORMAL HIGH (ref 22–32)
Calcium: 7.8 mg/dL — ABNORMAL LOW (ref 8.9–10.3)
Chloride: 86 mmol/L — ABNORMAL LOW (ref 98–111)
Creatinine, Ser: 2.02 mg/dL — ABNORMAL HIGH (ref 0.61–1.24)
GFR, Estimated: 36 mL/min — ABNORMAL LOW (ref >60)
Glucose, Bld: 164 mg/dL — ABNORMAL HIGH (ref 70–99)
Phosphorus: 5.2 mg/dL — ABNORMAL HIGH (ref 2.5–4.6)
Potassium: 3.3 mmol/L — ABNORMAL LOW (ref 3.5–5.1)
Sodium: 134 mmol/L — ABNORMAL LOW (ref 135–145)

## 2022-11-07 LAB — CBC WITH DIFFERENTIAL/PLATELET
Abs Immature Granulocytes: 0.07 10*3/uL (ref 0.00–0.07)
Basophils Absolute: 0.1 10*3/uL (ref 0.0–0.1)
Basophils Relative: 1 %
Eosinophils Absolute: 0 10*3/uL (ref 0.0–0.5)
Eosinophils Relative: 0 %
HCT: 43.6 % (ref 39.0–52.0)
Hemoglobin: 11.8 g/dL — ABNORMAL LOW (ref 13.0–17.0)
Immature Granulocytes: 1 %
Lymphocytes Relative: 15 %
Lymphs Abs: 1.6 10*3/uL (ref 0.7–4.0)
MCH: 21 pg — ABNORMAL LOW (ref 26.0–34.0)
MCHC: 27.1 g/dL — ABNORMAL LOW (ref 30.0–36.0)
MCV: 77.4 fL — ABNORMAL LOW (ref 80.0–100.0)
Monocytes Absolute: 1.1 10*3/uL — ABNORMAL HIGH (ref 0.1–1.0)
Monocytes Relative: 10 %
Neutro Abs: 7.7 10*3/uL (ref 1.7–7.7)
Neutrophils Relative %: 73 %
Platelets: 277 10*3/uL (ref 150–400)
RBC: 5.63 MIL/uL (ref 4.22–5.81)
RDW: 19.2 % — ABNORMAL HIGH (ref 11.5–15.5)
WBC: 10.6 10*3/uL — ABNORMAL HIGH (ref 4.0–10.5)
nRBC: 0.2 % (ref 0.0–0.2)

## 2022-11-07 LAB — COMPREHENSIVE METABOLIC PANEL
ALT: 17 U/L (ref 0–44)
AST: 14 U/L — ABNORMAL LOW (ref 15–41)
Albumin: 3.5 g/dL (ref 3.5–5.0)
Alkaline Phosphatase: 119 U/L (ref 38–126)
Anion gap: 14 (ref 5–15)
BUN: 72 mg/dL — ABNORMAL HIGH (ref 8–23)
CO2: 34 mmol/L — ABNORMAL HIGH (ref 22–32)
Calcium: 8 mg/dL — ABNORMAL LOW (ref 8.9–10.3)
Chloride: 85 mmol/L — ABNORMAL LOW (ref 98–111)
Creatinine, Ser: 2.17 mg/dL — ABNORMAL HIGH (ref 0.61–1.24)
GFR, Estimated: 33 mL/min — ABNORMAL LOW (ref >60)
Glucose, Bld: 170 mg/dL — ABNORMAL HIGH (ref 70–99)
Potassium: 2.5 mmol/L — CL (ref 3.5–5.1)
Sodium: 133 mmol/L — ABNORMAL LOW (ref 135–145)
Total Bilirubin: 0.5 mg/dL (ref 0.3–1.2)
Total Protein: 8.1 g/dL (ref 6.5–8.1)

## 2022-11-07 LAB — SODIUM, URINE, RANDOM: Sodium, Ur: 17 mmol/L

## 2022-11-07 LAB — MAGNESIUM: Magnesium: 2.5 mg/dL — ABNORMAL HIGH (ref 1.7–2.4)

## 2022-11-07 LAB — PHOSPHORUS: Phosphorus: 6.1 mg/dL — ABNORMAL HIGH (ref 2.5–4.6)

## 2022-11-07 MED ORDER — GUAIFENESIN ER 600 MG PO TB12
1200.0000 mg | ORAL_TABLET | Freq: Two times a day (BID) | ORAL | Status: DC
  Administered 2022-11-07 – 2022-11-12 (×11): 1200 mg via ORAL
  Filled 2022-11-07 (×11): qty 2

## 2022-11-07 MED ORDER — POTASSIUM CHLORIDE CRYS ER 20 MEQ PO TBCR
40.0000 meq | EXTENDED_RELEASE_TABLET | Freq: Two times a day (BID) | ORAL | Status: DC
  Administered 2022-11-07: 40 meq via ORAL
  Filled 2022-11-07: qty 2

## 2022-11-07 MED ORDER — POTASSIUM CHLORIDE CRYS ER 20 MEQ PO TBCR
40.0000 meq | EXTENDED_RELEASE_TABLET | ORAL | Status: DC
  Administered 2022-11-07: 40 meq via ORAL

## 2022-11-07 MED ORDER — POTASSIUM CHLORIDE 10 MEQ/100ML IV SOLN: 10.0000 meq | INTRAVENOUS | Status: DC

## 2022-11-07 MED ORDER — POTASSIUM CHLORIDE CRYS ER 20 MEQ PO TBCR
40.0000 meq | EXTENDED_RELEASE_TABLET | ORAL | Status: DC
  Administered 2022-11-07: 40 meq via ORAL
  Filled 2022-11-07 (×2): qty 2

## 2022-11-07 NOTE — Progress Notes (Signed)
Per lab Patient has Potassium level of 2.5. Provider made aware. Awaiting response. Patient asymptomatic otherwise at this time.

## 2022-11-07 NOTE — Progress Notes (Signed)
PROGRESS NOTE    Gary [name]  ZOX:[id number] DOB: January 18, 1956 DOA: 10/16/2022 PCP: No primary care provider on file.   Brief Narrative:  Gary [name] was admitted to the hospital with the working diagnosis of acute on chronic diastolic heart failure.    67 y.o. male with medical history significant of diverticulosis, OSA, OHS, obesity, hypertension, hyperlipidemia, GERD, substance use, depression, anxiety, anemia, COPD, rare chronic respiratory failure with hypoxia, CKD 3A, diastolic CHF presenting with shortness of breath. Recent hospitalization 06/12 to 06/24 for respiratory failure due to pneumonia.   Reported worsening dyspnea and lower extremity edema for the last 7 days prior to admission. Reported he was not able to get his medications, because he had no help at home. On his initial physical examination his blood pressure was 114/67, HR 93, RR 31 and 02 saturation 93%, trach in place,  lungs with decreased breath sounds bilaterally with bilateral rales, heart with S1 and S2 present and rhythmic, abdomen with no distention and positive lower extremity edema.   Chest radiograph with hypoinflation and poor penetration, positive cardiomegaly and bilateral hilar vascular congestion.    Patient's respiratory status continues to improve somewhat over the past 48 hours, appreciate pulmonology insight recommendations and adjustment in home nebulized medications.  At this time patient is refusing to be discharged to [facility] facility/LTAC which was her previous plan.    Plan to discharge home when sable 02 requirements, down to 6 L/min per trach collar.   07/13 pending disposition.   Interim History He was diuresed and renal function started worsening so Diuretics held and Nephrology consulted. ECHOcardiogram is being repeated.   Assessment and Plan:  Acute on chronic diastolic CHF (congestive heart failure) ([name]) -Echocardiogram with preserved LV systolic function 60 to 70%, mild LVH, RV  systolic function preserved, no significant valvular disease. Poor windows.  -Continued diuresis with oral diuretics using torsemide 60mg  BID but now stopped given worsening in Renal Fxn -Use metolazone intermittently. -Discontinued SGLT2 inhibitor given worsened Renal Fxn.  Intake/Output Summary (Last 24 hours) at 11/07/2022 1952 Last data filed at 11/07/2022 1700 Gross per 24 hour  Intake --  Output 2150 ml  Net -2150 ml   -Following strict intake and output; continue to follow daily weights and low-sodium diet. -Will repeat echocardiogram and consult cardiology for further evaluation recommendations in the a.m.   Acute on Chronic Hypoxemic Respiratory Failure: -At baseline, patient is on 6 L supplemental oxygen. -Patient is status trach placement that can be kappa. -Patient is morbidly obese. SpO2: 100 % O2 Flow Rate (L/min): 10 L/min FiO2 (%): 35 % -Patient is requiring higher than usual amount of supplemental oxygen (10 L/min). -Consider disposition to an LTAC facility to help when patient off this level supplemental oxygen if accepted -Repeat CXR in the AM   COPD (chronic obstructive pulmonary disease) ([name]) -Continue bronchodilator therapy and inhaled corticosteroids.  -Patient on chronic prednisone 10 mg daily.  -Continue trach care.  -continue PT and OT    HTN (hypertension) -Continue blood pressure support with Midodrine. -Continue to follow vital signs. -Blood pressure stable. -Last BP reading was 111/79  Hypokalemia -Patient's K+ Level Trend: Recent Labs  Lab 11/01/22 0827 11/03/22 0530 11/03/22 1400 11/04/22 1359 11/05/22 0850 11/07/22 0928 11/07/22 1524  K 3.0* 4.1 4.3 4.2 3.5 2.5* 3.3*  -Replete with po Kcl 40 mEQ BID -Continue to Monitor and Replete as Necessary -Repeat CMP in the AM   GERD without esophagitis -Continue with PPI -Lifestyle changes discussed with patient.  History of DVT (deep vein thrombosis) -Continue anticoagulation with  apixaban.  -No signs of overt bleeding. -Reports no lower extremity pain currently.   Iron deficiency anemia/Microcytic Anemia -Hgb has been stable.  -Hgb/Hct Trend: Recent Labs  Lab 10/20/22 0024 10/21/22 0038 10/22/22 0050 10/23/22 0043 10/24/22 0925 10/31/22 0501 11/07/22 0928  HGB 9.4* 9.8* 9.7* 9.5* 9.5* 10.5* 11.8*  HCT 38.0* 38.6* 38.0* 38.1* 37.6* 40.2 43.6  MCV 83.0 81.3 84.4 83.6 82.8 80.4 77.4*  -Check Anemia Panel in the AM, Continue to Monitor for S/Sx of Bleeding -No signs of overt bleeding appreciated. -Repeat CBC in the AM   Hyperlipidemia -Continue statin therapy.  -Heart healthy diet discussed with patient.   Chronic Colitis -Continue treatment with mesalamine.  -No signs of acute exacerbation.  -Patient reports no nausea, no vomiting and no abdominal pain at the moment. -Tolerating diet without problems.   AKI (acute kidney injury) ([name]), worsening  -Continue to closely follow renal function trend -Creatinine fluctuating with a creatinine in the 1.3-1.4 at the moment; patient renal function has been as high as 1.7--1.8 in the past. -BUN/Cr Trend: Recent Labs  Lab 10/29/22 0047 10/31/22 0501 11/01/22 0827 11/03/22 0530 11/03/22 1400 11/04/22 1359 11/05/22 0850  BUN 30* 35* 35* 39* 35* 41* 52*  CREATININE 1.86* 1.37* 1.27* 1.45* 1.44* 1.62* 1.87*  -Avoid Nephrotoxic Medications, Contrast Dyes, Hypotension and Dehydration to Ensure Adequate Renal Perfusion and will need to Renally Adjust Meds -Hold Torsemide for now -Continue to Follow Strict I's and O's and Monitor Urine Output -Continue to Monitor and Trend Renal Function carefully and repeat CMP in the AM  -Nephrology Consulted fro further evaluation and recommendations patient underwent full workup and getting a urinalysis, urine sodium, renal ultrasound and recommending obtaining cardiology consultation given his issues with fluid retention; nephrology recommending holding SGLT2 for now but  consider restarting soon and considering further diuretics tomorrow after potassium is improved  Hypoalbuminemia -Patient's Albumin Trend: Recent Labs  Lab 11/01/22 0827 11/03/22 0530 11/03/22 1400 11/04/22 1359 11/05/22 0850 11/07/22 0928 11/07/22 1524  ALBUMIN 2.9* 2.8* 3.0* 3.3* 3.5 3.5 3.5  -Continue to Monitor and Trend and repeat CMP in the AM  Super Morbid Obesity with BMI of 50.0-59.9, adult ([name]) -Complicates overall prognosis and care -Estimated body mass index is 50.69 kg/m as calculated from the following:   Height as of this encounter: 6\' 4"  (1.93 m).   Weight as of this encounter: 188.9 kg.  -Weight Loss and Dietary Counseling given and Low-calorie diet and portion control discussed with patient.  DVT prophylaxis: apixaban (ELIQUIS) tablet 2.5 mg Start: 10/17/22 2200 apixaban (ELIQUIS) tablet 2.5 mg    Code Status: Full Code Family Communication: No family present at bedside  Disposition Plan:  Level of care: Med-Surg Status is: Inpatient Remains inpatient appropriate because: Needs further clinical improvement   Consultants:  Nephrology Palliative Care  Procedures:  ECHOCARDIOGRAM  Antimicrobials:  Anti-infectives (From admission, onward)    None       Subjective: Seen and examined at bedside and states that he is feeling more short of breath.  Thinks his legs are less swollen.  Denies any nausea or vomiting.  Denies any other concerns or complaints at this time.  Objective: Vitals:   11/07/22 0845 11/07/22 1420 11/07/22 1700 11/07/22 1957  BP:   111/79 121/82  Pulse: 93 88 92 (!) 57  Resp: 20 18    Temp:   98.9 F (37.2 C) 98.1 F (36.7 C)  TempSrc:  Oral Oral  SpO2: 100% 100%  90%  Weight:      Height:        Intake/Output Summary (Last 24 hours) at 11/07/2022 2004 Last data filed at 11/07/2022 1700 Gross per 24 hour  Intake --  Output 2150 ml  Net -2150 ml   Filed Weights   11/05/22 0431 11/06/22 0212 11/07/22 0423  Weight:  (!) 186 kg (!) 189.4 kg (!) 188.9 kg   Examination: Physical Exam:  Constitutional: WN/WD super morbidly obese African-American male who appears slightly uncomfortable Respiratory: Diminished to auscultation bilaterally with some coarse breath sounds and has some slight rhonchi and mild crackles. Normal respiratory effort and patient is not tachypenic. No accessory muscle use.  On 10 L supplemental oxygen via trach collar Cardiovascular: RRR, no murmurs / rubs / gallops. S1 and S2 auscultated.  Mild lower extremity edema Abdomen: Soft, non-tender, distended secondary to body habitus.. Bowel sounds positive.  GU: Deferred. Musculoskeletal: No clubbing / cyanosis of digits/nails. No joint deformity upper and lower extremities.  Skin: No rashes, lesions, ulcers limited skin evaluation. No induration; Warm and dry.  Neurologic: CN 2-12 grossly intact with no focal deficits. Romberg sign and cerebellar reflexes not assessed.  Psychiatric: Normal judgment and insight. Alert and oriented x 3. Normal mood and appropriate affect.   Data Reviewed: I have personally reviewed following labs and imaging studies  CBC: Recent Labs  Lab 11/07/22 0928  WBC 10.6*  NEUTROABS 7.7  HGB 11.8*  HCT 43.6  MCV 77.4*  PLT 277   Basic Metabolic Panel: Recent Labs  Lab 11/03/22 0530 11/03/22 1400 11/04/22 1359 11/05/22 0850 11/07/22 0928 11/07/22 1524  NA 138 137 135 134* 133* 134*  K 4.1 4.3 4.2 3.5 2.5* 3.3*  CL 94* 93* 87* 86* 85* 86*  CO2 32 33* 30 33* 34* 35*  GLUCOSE 138* 150* 172* 165* 170* 164*  BUN 39* 35* 41* 52* 72* 76*  CREATININE 1.45* 1.44* 1.62* 1.87* 2.17* 2.02*  CALCIUM 8.0* 8.3* 8.4* 8.3* 8.0* 7.8*  MG 2.3 2.3 2.3 2.3 2.5*  --   PHOS 4.5 4.2 4.7* 5.8* 6.1* 5.2*   GFR: Estimated Creatinine Clearance: 65.8 mL/min (A) (by C-G formula based on SCr of 2.02 mg/dL (H)). Liver Function Tests: Recent Labs  Lab 11/03/22 1400 11/04/22 1359 11/05/22 0850 11/07/22 0928 11/07/22 1524   AST  --   --   --  14*  --   ALT  --   --   --  17  --   ALKPHOS  --   --   --  119  --   BILITOT  --   --   --  0.5  --   PROT  --   --   --  8.1  --   ALBUMIN 3.0* 3.3* 3.5 3.5 3.5   No results for input(s): "LIPASE", "AMYLASE" in the last 168 hours. No results for input(s): "AMMONIA" in the last 168 hours. Coagulation Profile: No results for input(s): "INR", "PROTIME" in the last 168 hours. Cardiac Enzymes: No results for input(s): "CKTOTAL", "CKMB", "CKMBINDEX", "TROPONINI" in the last 168 hours. BNP (last 3 results) No results for input(s): "PROBNP" in the last 8760 hours. HbA1C: No results for input(s): "HGBA1C" in the last 72 hours. CBG: No results for input(s): "GLUCAP" in the last 168 hours. Lipid Profile: No results for input(s): "CHOL", "HDL", "LDLCALC", "TRIG", "CHOLHDL", "LDLDIRECT" in the last 72 hours. Thyroid Function Tests: No results for input(s): "TSH", "T4TOTAL", "FREET4", "T3FREE", "  THYROIDAB" in the last 72 hours. Anemia Panel: No results for input(s): "VITAMINB12", "FOLATE", "FERRITIN", "TIBC", "IRON", "RETICCTPCT" in the last 72 hours. Sepsis Labs: No results for input(s): "PROCALCITON", "LATICACIDVEN" in the last 168 hours.  No results found for this or any previous visit (from the past 240 hour(s)).   Radiology Studies: DG CHEST PORT 1 VIEW  Result Date: 11/07/2022 CLINICAL DATA:  Shortness of breath EXAM: PORTABLE CHEST 1 VIEW COMPARISON:  11/03/2022 FINDINGS: Tracheostomy in place. Chronic cardiomegaly and aortic tortuosity. Pulmonary venous hypertension, possibly with early interstitial edema. No consolidation or collapse. No definite pleural effusion. IMPRESSION: Cardiomegaly and aortic tortuosity. Pulmonary venous hypertension, possibly with early interstitial edema. Electronically Signed   By: Paulina Fusi M.D.   On: 11/07/2022 14:09    Scheduled Meds:  apixaban  2.5 mg Oral BID   arformoterol  15 mcg Nebulization BID   budesonide (PULMICORT)  nebulizer solution  0.5 mg Nebulization BID   docusate sodium  100 mg Oral Daily   ferrous sulfate  325 mg Oral Q breakfast   guaiFENesin  1,200 mg Oral BID   melatonin  3 mg Oral QHS   mesalamine  2.4 g Oral BID   midodrine  10 mg Oral BID WC   mouth rinse  15 mL Mouth Rinse 4 times per day   pantoprazole  40 mg Oral QHS   potassium chloride  40 mEq Oral Q4H   predniSONE  10 mg Oral Q breakfast   revefenacin  175 mcg Nebulization Daily   sodium chloride flush  3 mL Intravenous Q12H   Continuous Infusions:   LOS: 21 days   Marguerita Merles, DO [facility] Available via Epic secure chat 7am-7pm After these hours, please refer to coverage provider listed on [email or web address] 11/07/2022, 8:04 PM

## 2022-11-07 NOTE — Progress Notes (Signed)
   11/07/22 2100  BiPAP/CPAP/SIPAP  BiPAP/CPAP/SIPAP Pt Type Adult  Reason BIPAP/CPAP not in use Other(comment) (Pt home unit on stand-by)  Patient Home Equipment Yes  BiPAP/CPAP /SiPAP Vitals  Bilateral Breath Sounds Clear  MEWS Score/Color  MEWS Score 0  MEWS Score Color [place]

## 2022-11-07 NOTE — TOC Progression Note (Signed)
Transition of Care [facility]) - Progression Note    Patient Details  Name: Gary [name] MRN: [id number] Date of Birth: [birth year]  [facility] CM/SW Contact  Janae Bridgeman, RN Phone Number: 11/07/2022, 11:36 AM  Clinical Narrative:    CM called and spoke with Denzil Magnuson, [name] with [facility] Team and [facility] Medicare has requested a Peer to Peer with the physician today by 3:30 pm.  Attending MD, Dr. Marland Mcalpine was requested to call [phone] option [phone] - Patient ID number [id number].  Claudean Severance, CM with [facility] is aware and will continue to follow the patient for request for LTAC placement due to patient's oxygen needs, frequent readmissions to the hospital and need for physician guidance before returning home with [facility] personal care services - that may take up to 2 weeks to obtain insurance authorization.     Expected Discharge Plan: Long Term Acute Care (LTAC) Barriers to Discharge: Continued Medical Work up  Expected Discharge Plan and Services In-house Referral: Clinical Social Work Discharge Planning Services: CM Consult Post Acute Care Choice: Long Term Acute Care (LTAC) (LTAC versus home with private duty RN) Living arrangements for the past 2 months: Single Family Home                 DME Arranged: N/A DME Agency: NA Date DME Agency Contacted: 11/05/22 Time DME Agency Contacted: [phone] Representative spoke with at DME Agency: Kai Levins - requested that [employer] go to home to move hospital bed to living room instead of bedroom HH Arranged: PT, OT HH Agency: [facility] Date [facility] Agency Contacted: 11/05/22 Time HH Agency Contacted: [phone] Representative spoke with at [facility] Agency: [facility] declined, [facility] declined   Social Determinants of Health (SDOH) Interventions SDOH Screenings   Food Insecurity: No Food Insecurity (10/18/2022)  Housing: Patient Declined (10/18/2022)  Transportation Needs: No Transportation Needs (10/18/2022)  Recent Concern:  Transportation Needs - Unmet Transportation Needs (08/13/2022)  Utilities: Not At Risk (10/18/2022)  Depression (PHQ2-9): Low Risk  (06/07/2021)  Financial Resource Strain: Medium Risk (05/03/2017)  Physical Activity: Insufficiently Active (05/03/2017)  Social Connections: Unknown (08/06/2022)   [facility]  Stress: No Stress Concern Present (05/03/2017)  Tobacco Use: Medium Risk (10/16/2022)    Readmission Risk Interventions    11/05/2022    2:59 PM 11/05/2022   12:16 PM 12/31/2021    8:58 AM  Readmission Risk Prevention Plan  Transportation Screening Complete Complete Complete  Medication Review Oceanographer) Complete Complete Complete  PCP or Specialist appointment within 3-5 days of discharge Complete Complete Complete  HRI or Home Care Consult Complete Complete Complete  SW Recovery Care/Counseling Consult Complete Complete Complete  Palliative Care Screening Complete Complete Complete  Skilled Nursing Facility Not Applicable Not Applicable Not Applicable

## 2022-11-07 NOTE — Progress Notes (Signed)
This chaplain is present for F/U spiritual care.   Through the space of reflective listening, the Pt. shares his appreciation of the visits as he waits for insurance approval for [employer]. The Pt. verbally shares his visualization of Home Health and optimism he will meet his goal.    The Pt. is aware  of the changes in his anxiety level during the daily routine. The Pt. shares he is practicing his breathing when he feels his anxiety rising.  The chaplain understands the Pt. is open to F/U spiritual care as he processes the next steps.  Chaplain Stephanie Acre [phone]
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
## 2022-11-07 NOTE — Plan of Care (Signed)
  Problem: Education: Goal: Ability to demonstrate management of disease process will improve Outcome: Progressing Goal: Ability to verbalize understanding of medication therapies will improve Outcome: Progressing   Problem: Activity: Goal: Capacity to carry out activities will improve Outcome: Progressing   

## 2022-11-08 ENCOUNTER — Inpatient Hospital Stay (HOSPITAL_COMMUNITY): Payer: 59

## 2022-11-08 DIAGNOSIS — Z86718 Personal history of other venous thrombosis and embolism: Secondary | ICD-10-CM | POA: Diagnosis not present

## 2022-11-08 DIAGNOSIS — J441 Chronic obstructive pulmonary disease with (acute) exacerbation: Secondary | ICD-10-CM | POA: Diagnosis not present

## 2022-11-08 DIAGNOSIS — I5033 Acute on chronic diastolic (congestive) heart failure: Secondary | ICD-10-CM | POA: Diagnosis not present

## 2022-11-08 DIAGNOSIS — R0609 Other forms of dyspnea: Secondary | ICD-10-CM | POA: Diagnosis not present

## 2022-11-08 LAB — CBC WITH DIFFERENTIAL/PLATELET
Abs Immature Granulocytes: 0.06 10*3/uL (ref 0.00–0.07)
Basophils Absolute: 0.1 10*3/uL (ref 0.0–0.1)
Basophils Relative: 1 %
Eosinophils Absolute: 0 10*3/uL (ref 0.0–0.5)
Eosinophils Relative: 0 %
HCT: 43.1 % (ref 39.0–52.0)
Hemoglobin: 11.7 g/dL — ABNORMAL LOW (ref 13.0–17.0)
Immature Granulocytes: 1 %
Lymphs Abs: 1.6 10*3/uL (ref 0.7–4.0)
MCH: 21.6 pg — ABNORMAL LOW (ref 26.0–34.0)
MCHC: 27.1 g/dL — ABNORMAL LOW (ref 30.0–36.0)
MCV: 79.5 fL — ABNORMAL LOW (ref 80.0–100.0)
Monocytes Absolute: 1 10*3/uL (ref 0.1–1.0)
Monocytes Relative: 10 %
Neutro Abs: 8.1 10*3/uL — ABNORMAL HIGH (ref 1.7–7.7)
Neutrophils Relative %: 73 %
Platelets: 246 10*3/uL (ref 150–400)
RBC: 5.42 MIL/uL (ref 4.22–5.81)
RDW: 19.4 % — ABNORMAL HIGH (ref 11.5–15.5)
WBC: 10.9 10*3/uL — ABNORMAL HIGH (ref 4.0–10.5)
nRBC: 0.3 % — ABNORMAL HIGH (ref 0.0–0.2)

## 2022-11-08 LAB — ECHOCARDIOGRAM COMPLETE
Area-P 1/2: 2.76 cm2
Calc EF: 57.8 %
Height: 76 in
S' Lateral: 3.7 cm
Single Plane A2C EF: 58.7 %
Single Plane A4C EF: 56.6 %
Weight: 6687.87 oz

## 2022-11-08 LAB — COMPREHENSIVE METABOLIC PANEL
ALT: 17 U/L (ref 0–44)
AST: 14 U/L — ABNORMAL LOW (ref 15–41)
Albumin: 3.4 g/dL — ABNORMAL LOW (ref 3.5–5.0)
Alkaline Phosphatase: 108 U/L (ref 38–126)
Anion gap: 16 — ABNORMAL HIGH (ref 5–15)
BUN: 83 mg/dL — ABNORMAL HIGH (ref 8–23)
CO2: 32 mmol/L (ref 22–32)
Calcium: 8.2 mg/dL — ABNORMAL LOW (ref 8.9–10.3)
Chloride: 85 mmol/L — ABNORMAL LOW (ref 98–111)
Creatinine, Ser: 2.07 mg/dL — ABNORMAL HIGH (ref 0.61–1.24)
GFR, Estimated: 35 mL/min — ABNORMAL LOW (ref >60)
Glucose, Bld: 176 mg/dL — ABNORMAL HIGH (ref 70–99)
Potassium: 2.9 mmol/L — ABNORMAL LOW (ref 3.5–5.1)
Total Bilirubin: 0.7 mg/dL (ref 0.3–1.2)
Total Protein: 7.7 g/dL (ref 6.5–8.1)

## 2022-11-08 LAB — MAGNESIUM: Magnesium: 2.6 mg/dL — ABNORMAL HIGH (ref 1.7–2.4)

## 2022-11-08 LAB — PHOSPHORUS: Phosphorus: 4.9 mg/dL — ABNORMAL HIGH (ref 2.5–4.6)

## 2022-11-08 MED ORDER — POTASSIUM CHLORIDE CRYS ER 20 MEQ PO TBCR
40.0000 meq | EXTENDED_RELEASE_TABLET | Freq: Two times a day (BID) | ORAL | Status: DC
  Administered 2022-11-08 – 2022-11-09 (×3): 40 meq via ORAL
  Filled 2022-11-08 (×3): qty 2

## 2022-11-08 MED ORDER — POTASSIUM CHLORIDE CRYS ER 20 MEQ PO TBCR
40.0000 meq | EXTENDED_RELEASE_TABLET | Freq: Once | ORAL | Status: AC
  Administered 2022-11-08: 40 meq via ORAL
  Filled 2022-11-08: qty 2

## 2022-11-08 MED ORDER — GERHARDT'S BUTT CREAM
TOPICAL_CREAM | Freq: Two times a day (BID) | CUTANEOUS | Status: DC
  Filled 2022-11-08: qty 1

## 2022-11-08 NOTE — Plan of Care (Signed)
  Problem: Education: Goal: Ability to demonstrate management of disease process will improve Outcome: Progressing Goal: Ability to verbalize understanding of medication therapies will improve Outcome: Progressing   Problem: Activity: Goal: Capacity to carry out activities will improve Outcome: Progressing   Problem: Cardiac: Goal: Ability to achieve and maintain adequate cardiopulmonary perfusion will improve Outcome: Progressing   Problem: Education: Goal: Knowledge of General Education information will improve Description: Including pain rating scale, medication(s)/side effects and non-pharmacologic comfort measures Outcome: Progressing   Problem: Health Behavior/Discharge Planning: Goal: Ability to manage health-related needs will improve Outcome: Progressing   Problem: Clinical Measurements: Goal: Ability to maintain clinical measurements within normal limits will improve Outcome: Progressing

## 2022-11-08 NOTE — Consult Note (Addendum)
WOC Nurse Consult Note: Reason for Consult:Asked to assess for sacral/buttocks wounds.  No open areas noted.  Bedside RN at bedside with me and agrees there is no visible open areas to left buttock as noted in consult.  Patient states he feels tender on left posterior thigh near buttocks.  Sacral area intact but slightly whiter in appearance.  Patient has used bedpan for large bowel movement.  He is able to reach back and wash buttocks folds independently after staff cleaned him initially.   Wound type: moisture associated skin damage Pressure Injury POA: NA Measurement:2 cm erythema to left posterior upper thigh near buttocks Sacrum:  1 cm x 0.2 cm maceration  Wound ZOX:WRUEAV Drainage (amount, consistency, odor) none noted Periwound: dry skin Dressing procedure/placement/frequency: cleanse perineal skin twice daily and apply gerhardts barrier paste to buttocks and posterior thighs near buttocks Will not follow at this time.  Please re-consult if needed.  Mike Gip MSN, RN, FNP-BC CWON Wound, Ostomy, Continence Nurse Outpatient [facility] [phone] Pager [phone]

## 2022-11-08 NOTE — Progress Notes (Signed)
  Echocardiogram 2D Echocardiogram has been performed.  Janalyn Harder 11/08/2022, 11:45 AM

## 2022-11-08 NOTE — Progress Notes (Signed)
Occupational Therapy Treatment Patient Details Name: Gary [name] MRN: [id number] DOB: October 04, 1955 Today's Date: 11/08/2022   History of present illness 68 y.o. male presents to [facility] hospital on 10/16/2022 with reports of SOB and chest pain. Pt was in ED earlier this morning and elected to discharge home, however in route to home decided to return home for placement due to inadequate caregiver support. Of note, pt has had 4 admissions in the last 6 months, and 9 admissions in the last year. PMH includes obesity, hypoxia, respiratory failure with tracheostomy, COPD, GIB, HFpEF.   OT comments  Pt agreeable to OT session. Pt with decreased cardiopulmonary endurance and with self limiting behavior despite max attempts at education regarding mobility progression/strengthening of BLE/core. Pt most interested in self-directed tasks. Pt with fair skill to determine when rest break is needed this session, predominantly between transitions from supine to sit and after functional ambulation. Requiring total A to don shoes and unwilling to attempt; educated regarding potential trial of sock aid and shoe horn and pt eager to trial next session, but deferred today due to fatigue. Will continue to follow. Patient will benefit from continued inpatient follow up therapy, <3 hours/day    Recommendations for follow up therapy are one component of a multi-disciplinary discharge planning process, led by the attending physician.  Recommendations may be updated based on patient status, additional functional criteria and insurance authorization.    Assistance Recommended at Discharge Frequent or constant Supervision/Assistance  Patient can return home with the following  Two people to help with walking and/or transfers;A lot of help with bathing/dressing/bathroom;Assistance with cooking/housework;Direct supervision/assist for financial management;Assist for transportation;Help with stairs or ramp for entrance   Equipment  Recommendations  None recommended by OT    Recommendations for Other Services      Precautions / Restrictions Precautions Precautions: Fall;Other (comment) Precaution Comments: trach collar Restrictions Weight Bearing Restrictions: No RLE Weight Bearing: Weight bearing as tolerated       Mobility Bed Mobility Overal bed mobility: Needs Assistance Bed Mobility: Supine to Sit, Sit to Supine     Supine to sit: Min guard Sit to supine: Min assist   General bed mobility comments: Use of momentum to come to EOB +rail, assist with BLE back to bed, increased time    Transfers Overall transfer level: Needs assistance Equipment used: Rolling walker (2 wheels) Transfers: Sit to/from Stand Sit to Stand: Min guard, From elevated surface           General transfer comment: pt requesting bed in very elevated position and refusing to attempt from lower height despite max education, and also refusing chair because no lift pad in room     Balance Overall balance assessment: Needs assistance Sitting-balance support: Feet supported, No upper extremity supported Sitting balance-[name] Scale: Good Sitting balance - Comments: sitting EOB   Standing balance support: Bilateral upper extremity supported, Reliant on assistive device for balance, During functional activity Standing balance-[name] Scale: Poor Standing balance comment: B UE support for balance                           ADL either performed or assessed with clinical judgement   ADL Overall ADL's : Needs assistance/impaired     Grooming: Set up;Sitting Grooming Details (indicate cue type and reason): cues for breaks between tasks as pt with notably increased WOB             Lower Body Dressing: Total assistance;Sitting/lateral  leans Lower Body Dressing Details (indicate cue type and reason): to don shoes.     Toileting- Clothing Manipulation and Hygiene: Set up;Bed level Toileting - Clothing Manipulation  Details (indicate cue type and reason): for anterior pericare     Functional mobility during ADLs: Min guard;Rolling walker (2 wheels) General ADL Comments: Pt with very specific ways he wants bed positioned to get OOB and not receptive to collaboration/education. Attempted to mention bed at home being different, but pt continues to be adamant bed here needs to be set up a certain way. Additionally, with BUE exercises, pt requesting therapy band be set-up a certain way and requiring max education regarding safety.    Extremity/Trunk Assessment Upper Extremity Assessment Upper Extremity Assessment: Overall WFL for tasks assessed   Lower Extremity Assessment Lower Extremity Assessment: Defer to PT evaluation        Vision       Perception     Praxis      Cognition Arousal/Alertness: Awake/alert Behavior During Therapy: Agitated, Anxious Overall Cognitive Status: Within Functional Limits for tasks assessed                                 General Comments: Easily agitated. Demanding. Wants to only do things his way. Not receptive to education or instruction overall unless he believes it will significantly improve QOL.        Exercises Exercises: Other exercises Other Exercises Other Exercises: BUE lat pull downs with therapy band on bed as well as forward punches.    Shoulder Instructions       General Comments VSS on 12L 40% FiO2    Pertinent Vitals/ Pain       Pain Assessment Pain Assessment: No/denies pain Pain Intervention(s): Monitored during session  Home Living                                          Prior Functioning/Environment              Frequency  Min 1X/week        Progress Toward Goals  OT Goals(current goals can now be found in the care plan section)  Progress towards OT goals: Progressing toward goals  Acute Rehab OT Goals Patient Stated Goal: get stronger OT Goal Formulation: With patient Time For  Goal Achievement: 11/14/22 Potential to Achieve Goals: Good ADL Goals Pt Will Perform Grooming: with set-up;sitting (at sink) Pt Will Perform Lower Body Dressing: with adaptive equipment;sit to/from stand;with mod assist Pt Will Transfer to Toilet: with min guard assist;ambulating;bedside commode Pt Will Perform Toileting - Clothing Manipulation and hygiene: with min guard assist;sit to/from stand  Plan Discharge plan needs to be updated;Frequency remains appropriate    Co-evaluation                 AM-PAC OT "6 Clicks" Daily Activity     Outcome Measure   Help from another person eating meals?: None Help from another person taking care of personal grooming?: A Little Help from another person toileting, which includes using toliet, bedpan, or urinal?: Total Help from another person bathing (including washing, rinsing, drying)?: A Lot Help from another person to put on and taking off regular upper body clothing?: A Little Help from another person to put on and taking off regular lower body clothing?: Total 6  Click Score: 14    End of Session Equipment Utilized During Treatment: Oxygen;Rolling walker (2 wheels)  OT Visit Diagnosis: Unsteadiness on feet (R26.81);Other abnormalities of gait and mobility (R26.89);Muscle weakness (generalized) (M62.81);Adult, failure to thrive (R62.7)   Activity Tolerance Patient tolerated treatment well   Patient Left in bed;with call bell/phone within reach;with bed alarm set   Nurse Communication Mobility status        Time: 2536-6440 OT Time Calculation (min): 22 min  Charges: OT General Charges $OT Visit: 1 Visit OT Treatments $Self Care/Home Management : 8-22 mins  [name], OTR/L [facility] Acute Rehabilitation Office: [phone]   [name] 11/08/2022, 5:33 PM

## 2022-11-08 NOTE — Progress Notes (Signed)
PROGRESS NOTE    IVAAN LIDDY  ZOX:[id number] DOB: December 11, 1955 DOA: 10/16/2022 PCP: No primary care provider on file.   Brief Narrative:  Mr. Gatliff was admitted to the hospital with the working diagnosis of acute on chronic diastolic heart failure.    67 y.o. male with medical history significant of diverticulosis, OSA, OHS, obesity, hypertension, hyperlipidemia, GERD, substance use, depression, anxiety, anemia, COPD, rare chronic respiratory failure with hypoxia, CKD 3A, diastolic CHF presenting with shortness of breath. Recent hospitalization 06/12 to 06/24 for respiratory failure due to pneumonia.   Reported worsening dyspnea and lower extremity edema for the last 7 days prior to admission. Reported he was not able to get his medications, because he had no help at home. On his initial physical examination his blood pressure was 114/67, HR 93, RR 31 and 02 saturation 93%, trach in place,  lungs with decreased breath sounds bilaterally with bilateral rales, heart with S1 and S2 present and rhythmic, abdomen with no distention and positive lower extremity edema.   Chest radiograph with hypoinflation and poor penetration, positive cardiomegaly and bilateral hilar vascular congestion.    Patient's respiratory status continues to improve somewhat over the past 48 hours, appreciate pulmonology insight recommendations and adjustment in home nebulized medications.  At this time patient is refusing to be discharged to [facility] facility/LT[facility] which was her previous plan.    Plan to discharge home when sable 02 requirements, down to 6 L/min per trach collar.   07/13 pending disposition.   Interim History He was diuresed and renal function started worsening so Diuretics held and Nephrology consulted. ECHOcardiogram is being repeated and showed an EF of 55 to 60% but did show grade 1 diastolic dysfunction.  Further workup was done by nephrology and they are continue to hold his diuresis and his right  upper quadrant ultrasound was done showed no hydronephrosis and urinalysis with unremarkable without concern.  Patient's urine sodium was very low concerning for cardiorenal and we will consult cardiology in the a.m. for further evaluation.   Assessment and Plan:  Acute on chronic diastolic CHF (congestive heart failure) ([name]) -Echocardiogram with preserved LV systolic function 60 to 70%, mild LVH, RV systolic function preserved, no significant valvular disease. Poor windows.  -Continued diuresis with oral diuretics using torsemide 60mg  BID but now stopped given worsening in Renal Fxn -Use metolazone intermittently. -Discontinued SGLT2 inhibitor given worsened Renal Fxn.  Intake/Output Summary (Last 24 hours) at 11/08/2022 2026 Last data filed at 11/08/2022 2015 Gross per 24 hour  Intake --  Output 1060 ml  Net -1060 ml   -Following strict intake and output; continue to follow daily weights and low-sodium diet. -Repeat echocardiogram done and showed "Left ventricular ejection fraction, by estimation, is 55 to 60%. The  left ventricle has normal function. The left ventricle has no regional wall motion abnormalities. There is moderate concentric left ventricular hypertrophy. Left ventricular  diastolic parameters are consistent with Grade I diastolic dysfunction  (impaired relaxation). Right ventricular systolic function is normal. The right ventricular size is normal. The mitral valve is normal in structure. No evidence of mitral valve regurgitation. No evidence of mitral stenosis.  The aortic valve is normal in structure. Aortic valve regurgitation is  not visualized. No aortic stenosis is present. Aortic dilatation noted. There is borderline dilatation of the ascending aorta, measuring 38 mm. The inferior vena cava is normal in size with greater than 50% respiratory variability, suggesting right atrial pressure of 3 mmHg. Technically limited study due to  poor sound wave transmission. "   Acute  on Chronic Hypoxemic Respiratory Failure: -At baseline, patient is on 6 L supplemental oxygen. -Patient is status trach placement that can be kappa. -Patient is morbidly obese. SpO2: 98 % O2 Flow Rate (L/min): 12 L/min FiO2 (%): (S) 40 % (Increased to 40% / 12L due to desaturation while sleeping.) -Patient is requiring higher than usual amount of supplemental oxygen (12 L/min). -Consider disposition to an LTAC facility to help when patient off this level supplemental oxygen if accepted -Repeat CXR in the AM   COPD (chronic obstructive pulmonary disease) ([name]) -Continue bronchodilator therapy and inhaled corticosteroids.  -Patient on chronic prednisone 10 mg daily.  -Continue trach care.  -continue PT and OT    HTN (hypertension) -Continue blood pressure support with Midodrine. -Continue to follow vital signs. -Blood pressure stable. -Last BP reading was 102/73  Hypokalemia -Patient's K+ Level Trend: Recent Labs  Lab 11/03/22 0530 11/03/22 1400 11/04/22 1359 11/05/22 0850 11/07/22 0928 11/07/22 1524 11/08/22 0801  K 4.1 4.3 4.2 3.5 2.5* 3.3* 2.9*  -Replete with po Kcl 40 mEQ BID and an Additional 40 mEQ -Continue to Monitor and Replete as Necessary -Repeat CMP in the AM   GERD without esophagitis -Continue with PPI -Lifestyle changes discussed with patient.   History of DVT (deep vein thrombosis) -Continue anticoagulation with apixaban.  -No signs of overt bleeding. -Reports no lower extremity pain currently.   Iron deficiency anemia/Microcytic Anemia -Hgb has been stable.  -Hgb/Hct Trend: Recent Labs  Lab 10/21/22 0038 10/22/22 0050 10/23/22 0043 10/24/22 0925 10/31/22 0501 11/07/22 0928 11/08/22 0801  HGB 9.8* 9.7* 9.5* 9.5* 10.5* 11.8* 11.7*  HCT 38.6* 38.0* 38.1* 37.6* 40.2 43.6 43.1  MCV 81.3 84.4 83.6 82.8 80.4 77.4* 79.5*  -Check Anemia Panel in the AM, Continue to Monitor for S/Sx of Bleeding -No signs of overt bleeding appreciated. -Repeat CBC  in the AM   Hyperlipidemia -Continue statin therapy.  -Heart healthy diet discussed with patient.   Chronic Colitis -Continue treatment with mesalamine.  -No signs of acute exacerbation.  -Patient reports no nausea, no vomiting and no abdominal pain at the moment. -Tolerating diet without problems.   AKI (acute kidney injury) ([name]), worsening  -Continue to closely follow renal function trend -Creatinine fluctuating with a creatinine in the 1.3-1.4 at the moment; patient renal function has been as high as 1.7--1.8 in the past. -BUN/Cr Trend: Recent Labs  Lab 11/03/22 0530 11/03/22 1400 11/04/22 1359 11/05/22 0850 11/07/22 0928 11/07/22 1524 11/08/22 0801  BUN 39* 35* 41* 52* 72* 76* 83*  CREATININE 1.45* 1.44* 1.62* 1.87* 2.17* 2.02* 2.07*  -Avoid Nephrotoxic Medications, Contrast Dyes, Hypotension and Dehydration to Ensure Adequate Renal Perfusion and will need to Renally Adjust Meds -Hold Torsemide for now -Continue to Follow Strict I's and O's and Monitor Urine Output -Continue to Monitor and Trend Renal Function carefully and repeat CMP in the AM  -Nephrology Consulted fro further evaluation and recommendations patient underwent full workup and getting a urinalysis, urine sodium, renal ultrasound and recommending obtaining cardiology consultation given his issues with fluid retention; nephrology recommending holding SGLT2 for now but consider restarting soon and considering further diuretics tomorrow after potassium is improved  Hypoalbuminemia -Patient's Albumin Trend: Recent Labs  Lab 11/03/22 0530 11/03/22 1400 11/04/22 1359 11/05/22 0850 11/07/22 0928 11/07/22 1524 11/08/22 0801  ALBUMIN 2.8* 3.0* 3.3* 3.5 3.5 3.5 3.4*  -Continue to Monitor and Trend and repeat CMP in the AM  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   Weight as of this encounter: 189.6 kg.  -Weight Loss and Dietary Counseling given and Low-calorie diet and portion control discussed with patient.   DVT prophylaxis: apixaban (ELIQUIS) tablet 2.5 mg Start: 10/17/22 2200 apixaban (ELIQUIS) tablet 2.5 mg    Code Status: Full Code Family Communication: No family present at bedside  Disposition Plan:  Level of care: Med-Surg Status is: Inpatient Remains inpatient appropriate because: Needs further clinical Improvement   Consultants:  Nephrology Palliative Care Medicine   Procedures:  ECHOCARDIOGRAM IMPRESSIONS     1. Left ventricular ejection fraction, by estimation, is 55 to 60%. The  left ventricle has normal function. The left ventricle has no regional  wall motion abnormalities. There is moderate concentric left ventricular  hypertrophy. Left ventricular  diastolic parameters are consistent with Grade I diastolic dysfunction  (impaired relaxation).   2. Right ventricular systolic function is normal. The right ventricular  size is normal.   3. The mitral valve is normal in structure. No evidence of mitral valve  regurgitation. No evidence of mitral stenosis.   4. The aortic valve is normal in structure. Aortic valve regurgitation is  not visualized. No aortic stenosis is present.   5. Aortic dilatation noted. There is borderline dilatation of the  ascending aorta, measuring 38 mm.   6. The inferior vena cava is normal in size with greater than 50%  respiratory variability, suggesting right atrial pressure of 3 mmHg.   7. Technically limited study due to poor sound wave transmission.   FINDINGS   Left Ventricle: Left ventricular ejection fraction, by estimation, is 55  to 60%. The left ventricle has normal function. The left ventricle has no  regional wall motion abnormalities. The left ventricular internal cavity  size was normal in size. There is   moderate concentric left ventricular  hypertrophy. Left ventricular  diastolic parameters are consistent with Grade I diastolic dysfunction  (impaired relaxation).   Right Ventricle: The right ventricular size is normal. No increase in  right ventricular wall thickness. Right ventricular systolic function is  normal.   Left Atrium: Left atrial size was normal in size.   Right Atrium: Right atrial size was normal in size.   Pericardium: There is no evidence of pericardial effusion.   Mitral Valve: The mitral valve is normal in structure. No evidence of  mitral valve regurgitation. No evidence of mitral valve stenosis.   Tricuspid Valve: The tricuspid valve is normal in structure. Tricuspid  valve regurgitation is trivial. No evidence of tricuspid stenosis.   Aortic Valve: The aortic valve is normal in structure. Aortic valve  regurgitation is not visualized. No aortic stenosis is present.   Pulmonic Valve: The pulmonic valve was not well visualized. Pulmonic valve  regurgitation is not visualized. No evidence of pulmonic stenosis.   Aorta: Aortic dilatation noted. There is borderline dilatation of the  ascending aorta, measuring 38 mm.   Venous: The inferior vena cava is normal in size with greater than 50%  respiratory variability, suggesting right atrial pressure of 3 mmHg.   IAS/Shunts: No atrial level shunt detected by color flow Doppler.     LEFT VENTRICLE  PLAX 2D  LVIDd:         5.00 cm     Diastology  LVIDs:  3.70 cm     LV e' medial:    3.26 cm/s  LV PW:         1.30 cm     LV E/e' medial:  10.8  LV IVS:        1.50 cm     LV e' lateral:   8.16 cm/s  LVOT diam:     2.50 cm     LV E/e' lateral: 4.3  LV SV:         71  LV SV Index:   23  LVOT Area:     4.91 cm    LV Volumes (MOD)  LV vol d, MOD A2C: 98.7 ml  LV vol d, MOD A4C: 96.4 ml  LV vol s, MOD A2C: 40.8 ml  LV vol s, MOD A4C: 41.8 ml  LV SV MOD A2C:     57.9 ml  LV SV MOD A4C:     96.4 ml  LV SV MOD BP:      58.6 ml   RIGHT  VENTRICLE             IVC  RV S prime:     13.50 cm/s  IVC diam: 1.70 cm  TAPSE (M-mode): 2.1 cm   LEFT ATRIUM             Index        RIGHT ATRIUM           Index  LA diam:        2.70 cm 0.89 cm/m   RA Area:     16.10 cm  LA Vol (A2C):   32.4 ml 10.69 ml/m  RA Volume:   40.20 ml  13.27 ml/m  LA Vol (A4C):   56.2 ml 18.55 ml/m  LA Biplane Vol: 45.7 ml 15.08 ml/m   AORTIC VALVE  LVOT Vmax:   88.30 cm/s  LVOT Vmean:  55.900 cm/s  LVOT VTI:    0.145 m    AORTA  Ao Root diam: 4.10 cm  Ao Asc diam:  3.80 cm   MITRAL VALVE  MV Area (PHT): 2.76 cm    SHUNTS  MV Decel Time: 275 msec    Systemic VTI:  0.14 m  MV E velocity: 35.10 cm/s  Systemic Diam: 2.50 cm  MV A velocity: 66.00 cm/s  MV E/A ratio:  0.53   Antimicrobials:  Anti-infectives (From admission, onward)    None       Subjective: Seen and examined at bedside thinks he is breathing is doing a little bit better however his oxygen requirement was worse.  No nausea or vomiting.  Asking for his diet to be changed back to what it was prior to when he was restricted for his calories.  No other concerns or complaints at this time.  Objective: Vitals:   11/08/22 1120 11/08/22 1502 11/08/22 1735 11/08/22 1918  BP:   112/75 102/73  Pulse: 86 86 85 88  Resp: 16 18 18 17   Temp:    98.2 F (36.8 C)  TempSrc:      SpO2: 96% 93% 96% 98%  Weight:      Height:        Intake/Output Summary (Last 24 hours) at 11/08/2022 2030 Last data filed at 11/08/2022 2015 Gross per 24 hour  Intake --  Output 1060 ml  Net -1060 ml   Filed Weights   11/06/22 0212 11/07/22 0423 11/08/22 0500  Weight: (!) 189.4 kg (!) 188.9 kg (!) 189.6  kg   Examination: Physical Exam:  Constitutional: WN/WD super morbidly obese chronically ill-appearing African-American male who appears little bit more comfortable Respiratory: Diminished to auscultation bilaterally with some coarse breath sounds and has some slight crackles, no wheezing, rales,  rhonchi or crackles. Normal respiratory effort and patient is not tachypenic. No accessory muscle use.  Wearing 12 L via trach collar Cardiovascular: RRR, no murmurs / rubs / gallops. S1 and S2 auscultated.  Mild 1+ extremity edema Abdomen: Soft, non-tender, distended secondary to body habitus. Bowel sounds positive.  GU: Deferred. Musculoskeletal: No clubbing / cyanosis of digits/nails. No joint deformity upper and lower extremities.  Skin: No rashes, lesions, ulcers or skin evaluation. No induration; Warm and dry.  Neurologic: CN 2-12 grossly intact with no focal deficits. Romberg sign and cerebellar reflexes not assessed.  Psychiatric: Normal judgment and insight. Alert and oriented x 3. Normal mood and appropriate affect.   Data Reviewed: I have personally reviewed following labs and imaging studies  CBC: Recent Labs  Lab 11/07/22 0928 11/08/22 0801  WBC 10.6* 10.9*  NEUTROABS 7.7 8.1*  HGB 11.8* 11.7*  HCT 43.6 43.1  MCV 77.4* 79.5*  PLT 277 246   Basic Metabolic Panel: Recent Labs  Lab 11/03/22 1400 11/04/22 1359 11/05/22 0850 11/07/22 0928 11/07/22 1524 11/08/22 0801  NA 137 135 134* 133* 134* 133*  K 4.3 4.2 3.5 2.5* 3.3* 2.9*  CL 93* 87* 86* 85* 86* 85*  CO2 33* 30 33* 34* 35* 32  GLUCOSE 150* 172* 165* 170* 164* 176*  BUN 35* 41* 52* 72* 76* 83*  CREATININE 1.44* 1.62* 1.87* 2.17* 2.02* 2.07*  CALCIUM 8.3* 8.4* 8.3* 8.0* 7.8* 8.2*  MG 2.3 2.3 2.3 2.5*  --  2.6*  PHOS 4.2 4.7* 5.8* 6.1* 5.2* 4.9*   GFR: Estimated Creatinine Clearance: 64.4 mL/min (A) (by C-G formula based on SCr of 2.07 mg/dL (H)). Liver Function Tests: Recent Labs  Lab 11/04/22 1359 11/05/22 0850 11/07/22 0928 11/07/22 1524 11/08/22 0801  AST  --   --  14*  --  14*  ALT  --   --  17  --  17  ALKPHOS  --   --  119  --  108  BILITOT  --   --  0.5  --  0.7  PROT  --   --  8.1  --  7.7  ALBUMIN 3.3* 3.5 3.5 3.5 3.4*   No results for input(s): "LIPASE", "AMYLASE" in the last 168  hours. No results for input(s): "AMMONIA" in the last 168 hours. Coagulation Profile: No results for input(s): "INR", "PROTIME" in the last 168 hours. Cardiac Enzymes: No results for input(s): "CKTOTAL", "CKMB", "CKMBINDEX", "TROPONINI" in the last 168 hours. BNP (last 3 results) No results for input(s): "PROBNP" in the last 8760 hours. HbA1C: No results for input(s): "HGBA1C" in the last 72 hours. CBG: No results for input(s): "GLUCAP" in the last 168 hours. Lipid Profile: No results for input(s): "CHOL", "HDL", "LDLCALC", "TRIG", "CHOLHDL", "LDLDIRECT" in the last 72 hours. Thyroid Function Tests: No results for input(s): "TSH", "T4TOTAL", "FREET4", "T3FREE", "THYROIDAB" in the last 72 hours. Anemia Panel: No results for input(s): "VITAMINB12", "FOLATE", "FERRITIN", "TIBC", "IRON", "RETICCTPCT" in the last 72 hours. Sepsis Labs: No results for input(s): "PROCALCITON", "LATICACIDVEN" in the last 168 hours.  No results found for this or any previous visit (from the past 240 hour(s)).   Radiology Studies: ECHOCARDIOGRAM COMPLETE  Result Date: 11/08/2022    ECHOCARDIOGRAM REPORT   Patient Name:  Carmell Austria Date of Exam: 11/08/2022 Medical Rec #:  [id number]      Height:       76.0 in Accession #:    [id number]     Weight:       418.0 lb Date of Birth:  [birth year]      BSA:          3.030 m Patient Age:    67 years       BP:           94/73 mmHg Patient Gender: M              HR:           59 bpm. Exam Location:  Inpatient Procedure: 2D Echo, Cardiac Doppler and Color Doppler Indications:    R06.9 DOE  History:        Patient has prior history of Echocardiogram examinations, most                 recent 08/13/2022. COPD, Signs/Symptoms:Dyspnea and Shortness of                 Breath; Risk Factors:Hypertension. Trach collar.  Sonographer:    Sheralyn Boatman RDCS Referring Phys: [id number] [facility] LATIF [facility]  Sonographer Comments: Technically difficult study due to poor echo windows and patient is  obese. Image acquisition challenging due to patient body habitus and Image acquisition challenging due to uncooperative patient. Patient was rude and talking on the  phone during exam. Patient would not allow sufficient probe pressure in apical region. Patient stated he did not have IV access. Patient stated he had a visitor coming and would not allow echo if visitor came in. IMPRESSIONS  1. Left ventricular ejection fraction, by estimation, is 55 to 60%. The left ventricle has normal function. The left ventricle has no regional wall motion abnormalities. There is moderate concentric left ventricular hypertrophy. Left ventricular diastolic parameters are consistent with Grade I diastolic dysfunction (impaired relaxation).  2. Right ventricular systolic function is normal. The right ventricular size is normal.  3. The mitral valve is normal in structure. No evidence of mitral valve regurgitation. No evidence of mitral stenosis.  4. The aortic valve is normal in structure. Aortic valve regurgitation is not visualized. No aortic stenosis is present.  5. Aortic dilatation noted. There is borderline dilatation of the ascending aorta, measuring 38 mm.  6. The inferior vena cava is normal in size with greater than 50% respiratory variability, suggesting right atrial pressure of 3 mmHg.  7. Technically limited study due to poor sound wave transmission. FINDINGS  Left Ventricle: Left ventricular ejection fraction, by estimation, is 55 to 60%. The left ventricle has normal function. The left ventricle has no regional wall motion abnormalities. The left ventricular internal cavity size was normal in size. There is  moderate concentric left ventricular hypertrophy. Left ventricular diastolic parameters are consistent with Grade I diastolic dysfunction (impaired relaxation). Right Ventricle: The right ventricular size is normal. No increase in right ventricular wall thickness. Right ventricular systolic function is normal. Left  Atrium: Left atrial size was normal in size. Right Atrium: Right atrial size was normal in size. Pericardium: There is no evidence of pericardial effusion. Mitral Valve: The mitral valve is normal in structure. No evidence of mitral valve regurgitation. No evidence of mitral valve stenosis. Tricuspid Valve: The tricuspid valve is normal in structure. Tricuspid valve regurgitation is trivial. No evidence of tricuspid stenosis. Aortic Valve: The aortic valve is normal in  structure. Aortic valve regurgitation is not visualized. No aortic stenosis is present. Pulmonic Valve: The pulmonic valve was not well visualized. Pulmonic valve regurgitation is not visualized. No evidence of pulmonic stenosis. Aorta: Aortic dilatation noted. There is borderline dilatation of the ascending aorta, measuring 38 mm. Venous: The inferior vena cava is normal in size with greater than 50% respiratory variability, suggesting right atrial pressure of 3 mmHg. IAS/Shunts: No atrial level shunt detected by color flow Doppler.  LEFT VENTRICLE PLAX 2D LVIDd:         5.00 cm     Diastology LVIDs:         3.70 cm     LV e' medial:    3.26 cm/s LV PW:         1.30 cm     LV E/e' medial:  10.8 LV IVS:        1.50 cm     LV e' lateral:   8.16 cm/s LVOT diam:     2.50 cm     LV E/e' lateral: 4.3 LV SV:         71 LV SV Index:   23 LVOT Area:     4.91 cm  LV Volumes (MOD) LV vol d, MOD A2C: 98.7 ml LV vol d, MOD A4C: 96.4 ml LV vol s, MOD A2C: 40.8 ml LV vol s, MOD A4C: 41.8 ml LV SV MOD A2C:     57.9 ml LV SV MOD A4C:     96.4 ml LV SV MOD BP:      58.6 ml RIGHT VENTRICLE             IVC RV S prime:     13.50 cm/s  IVC diam: 1.70 cm TAPSE (M-mode): 2.1 cm LEFT ATRIUM             Index        RIGHT ATRIUM           Index LA diam:        2.70 cm 0.89 cm/m   RA Area:     16.10 cm LA Vol (A2C):   32.4 ml 10.69 ml/m  RA Volume:   40.20 ml  13.27 ml/m LA Vol (A4C):   56.2 ml 18.55 ml/m LA Biplane Vol: 45.7 ml 15.08 ml/m  AORTIC VALVE LVOT Vmax:    88.30 cm/s LVOT Vmean:  55.900 cm/s LVOT VTI:    0.145 m  AORTA Ao Root diam: 4.10 cm Ao Asc diam:  3.80 cm MITRAL VALVE MV Area (PHT): 2.76 cm    SHUNTS MV Decel Time: 275 msec    Systemic VTI:  0.14 m MV E velocity: 35.10 cm/s  Systemic Diam: 2.50 cm MV A velocity: 66.00 cm/s MV E/A ratio:  0.53 Arvilla Meres MD Electronically signed by Arvilla Meres MD Signature Date/Time: 11/08/2022/3:05:16 PM    Final    DG CHEST PORT 1 VIEW  Result Date: 11/08/2022 CLINICAL DATA:  Shortness of breath EXAM: PORTABLE CHEST 1 VIEW COMPARISON:  CXR 11/07/22 FINDINGS: Tracheostomy in place. No pleural effusion. No pneumothorax. Cardiomegaly. Low lung volumes. Mild pulmonary edema. Linear opacities in the bilateral mid lung fields likely represent atelectasis. No radiographically apparent displaced rib fractures. Visualized upper abdomen unremarkable. IMPRESSION: Cardiomegaly with mild pulmonary edema.  No pleural effusion. Electronically Signed   By: Lorenza Cambridge M.D.   On: 11/08/2022 07:56   US RENAL  Result Date: 11/08/2022 CLINICAL DATA:  [id number].  Acute kidney injury. EXAM: RENAL / URINARY  TRACT ULTRASOUND COMPLETE COMPARISON:  CTA abdomen and pelvis GI bleed exam 09/07/2022. FINDINGS: Technical note: Limited image detail due to patient body habitus and large size attenuating the sound beam. Right Kidney: Renal measurements: 10.2 x 5.1 x 4.4 cm = volume: 120.08 mL. Echogenicity within normal limits as well as can be seen. No obvious mass, stones or hydronephrosis visualized. Left Kidney: Renal measurements: 10.5 x 5.7 x 4.1 cm = volume: 128.99 mL. Echogenicity within normal limits as well as can be seen. No mass, stones or hydronephrosis visualized. There is a 2.8 cm simple cyst in the upper pole, which was seen on the CT. Bladder: Appears normal for degree of bladder distention. Other: None. IMPRESSION: Limited ultrasound. No increased cortical echogenicity is suspected. No stones or hydronephrosis. 2.8 cm left  renal cyst. Electronically Signed   By: Almira Bar M.D.   On: 11/08/2022 02:47   DG CHEST PORT 1 VIEW  Result Date: 11/07/2022 CLINICAL DATA:  Shortness of breath EXAM: PORTABLE CHEST 1 VIEW COMPARISON:  11/03/2022 FINDINGS: Tracheostomy in place. Chronic cardiomegaly and aortic tortuosity. Pulmonary venous hypertension, possibly with early interstitial edema. No consolidation or collapse. No definite pleural effusion. IMPRESSION: Cardiomegaly and aortic tortuosity. Pulmonary venous hypertension, possibly with early interstitial edema. Electronically Signed   By: Paulina Fusi M.D.   On: 11/07/2022 14:09    Scheduled Meds:  apixaban  2.5 mg Oral BID   arformoterol  15 mcg Nebulization BID   budesonide (PULMICORT) nebulizer solution  0.5 mg Nebulization BID   docusate sodium  100 mg Oral Daily   ferrous sulfate  325 mg Oral Q breakfast   Gerhardt's butt cream   Topical BID   guaiFENesin  1,200 mg Oral BID   melatonin  3 mg Oral QHS   mesalamine  2.4 g Oral BID   midodrine  10 mg Oral BID WC   mouth rinse  15 mL Mouth Rinse 4 times per day   pantoprazole  40 mg Oral QHS   potassium chloride  40 mEq Oral BID   predniSONE  10 mg Oral Q breakfast   revefenacin  175 mcg Nebulization Daily   sodium chloride flush  3 mL Intravenous Q12H   Continuous Infusions:   LOS: 22 days   Marguerita Merles, DO [facility] Available via Epic secure chat 7am-7pm After these hours, please refer to coverage provider listed on [email or web address] 11/08/2022, 8:30 PM

## 2022-11-08 NOTE — Progress Notes (Signed)
Nephrology Follow-Up Consult note   Assessment/Recommendations: Gary [name] is a/an 67 y.o. male with a past medical history significant for  OSA, obesity, OHS, HTN, HLD, substance use disorder, COPD, chronic hypoxic respiratory failure, CKD 3A, diastolic heart failure who presents with volume overload now w/ hypokalemia and AKI   Non-Oliguric AKI on CKD 3a: BL Crt ~1.3 but fluctuates. Now Crt up to 2.1 also with BUN elevation.  He does not look overtly dry.  Based on his weights I do not think he has been obviously over diuresed.  The ins and outs are likely not accurate given there are no documented ins on multiple days.  Is possible he has cardiorenal syndrome but also has fairly low blood pressure so possible ATN.   -Pausing his diuresis until hypokalemia improves then likely resume -RUS without hydro -UA without concern -Urine na fairly low concerning for cardiorenal -Hold diuresis for now unless hypoxia worsens significantly -Management of acute hypoxic respiratory failure as below -Consider cardiology involvement given his issues with fluid retention -Holding SGLT2 for now but consider restarting soon -Consider further diuretics tomorrow after potassium has been improved -Continue to monitor daily Cr, Dose meds for GFR -Monitor Daily I/Os, Daily weight  -Maintain MAP>65 for optimal renal perfusion.  -Avoid nephrotoxic medications including NSAIDs -Use synthetic opioids (Fentanyl/Dilaudid) if needed -Currently no indication for HD   Acute on chronic hypoxic respiratory failure: Obesity associated hypoxia and COPD at home now w/ worsening hypoxia. CXR not impressive today for pulmonary edema but given weight has not improved significantly in the last couple weeks he likely does have some degree of overload -titrate o2 prn -plans for diuresis as above   Severe hypokalemia: Likely related to diuresis. Patient refusing IV. Continue with oral repletion   Diastolic heart failure  exacerbation: Significant volume overload on admission.  Do not think he has been diuresed as well as we thought. Plan for diuretics as above. Restart SGLT2i soon. F/u echo   COPD: bronchodilators per primary   Hyponatremia: Mild and worsening over the last few days.  Likely reflects some degree of volume   Recommendations conveyed to primary service.    Darnell Level [facility] 11/08/2022 9:44 AM  ___________________________________________________________  CC: Volume overload  Interval History/Subjective: Good urine output despite holding diuretics.  However, weight up about 1 kg based on bed weight.  Hard to interpret.  Potassium remains low this morning.  Patient has no complaints.   Medications:  Current Facility-Administered Medications  Medication Dose Route Frequency Provider Last Rate Last Admin   acetaminophen (TYLENOL) tablet 650 mg  650 mg Oral Q6H PRN Synetta Fail, MD   650 mg at 11/05/22 2032   apixaban (ELIQUIS) tablet 2.5 mg  2.5 mg Oral BID Synetta Fail, MD   2.5 mg at 11/07/22 2125   arformoterol (BROVANA) nebulizer solution 15 mcg  15 mcg Nebulization BID Janeann Forehand D, NP   15 mcg at 11/08/22 0725   budesonide (PULMICORT) nebulizer solution 0.5 mg  0.5 mg Nebulization BID Melody Comas B, MD   0.5 mg at 11/08/22 0731   docusate sodium (COLACE) capsule 100 mg  100 mg Oral Daily Synetta Fail, MD   100 mg at 11/07/22 1610   ferrous sulfate tablet 325 mg  325 mg Oral Q breakfast Synetta Fail, MD   325 mg at 11/07/22 0902   guaiFENesin (MUCINEX) 12 hr tablet 1,200 mg  1,200 mg Oral BID Marguerita Merles [place], DO  1,200 mg at 11/07/22 2124   melatonin tablet 3 mg  3 mg Oral QHS Martina Sinner, MD   3 mg at 11/07/22 2125   mesalamine (LIALDA) EC tablet 2.4 g  2.4 g Oral BID Synetta Fail, MD   2.4 g at 11/07/22 2125   midodrine (PROAMATINE) tablet 10 mg  10 mg Oral BID WC Synetta Fail, MD   10 mg at  11/07/22 1717   Oral care mouth rinse  15 mL Mouth Rinse 4 times per day Berton Mount I, MD   15 mL at 11/07/22 2125   Oral care mouth rinse  15 mL Mouth Rinse PRN Berton Mount I, MD       pantoprazole (PROTONIX) EC tablet 40 mg  40 mg Oral QHS Synetta Fail, MD   40 mg at 11/07/22 2125   polyethylene glycol (MIRALAX / GLYCOLAX) packet 17 g  17 g Oral Daily PRN Synetta Fail, MD   17 g at 11/07/22 2140   potassium chloride SA (KLOR-CON M) CR tablet 40 mEq  40 mEq Oral BID Darnell Level, MD       predniSONE (DELTASONE) tablet 10 mg  10 mg Oral Q breakfast Synetta Fail, MD   10 mg at 11/07/22 3664   revefenacin (YUPELRI) nebulizer solution 175 mcg  175 mcg Nebulization Daily Janeann Forehand D, NP   175 mcg at 11/08/22 0725   sodium chloride flush (NS) 0.9 % injection 3 mL  3 mL Intravenous Q12H Synetta Fail, MD   3 mL at 10/25/22 2108   traMADol (ULTRAM) tablet 50 mg  50 mg Oral Q8H PRN Arrien, York Ram, MD   50 mg at 11/05/22 4034      Review of Systems: 10 systems reviewed and negative except per interval history/subjective  Physical Exam: Vitals:   11/08/22 0731 11/08/22 0934  BP:  135/76  Pulse:  86  Resp:  17  Temp:  97.9 F (36.6 C)  SpO2: 93% 96%   No intake/output data recorded.  Intake/Output Summary (Last 24 hours) at 11/08/2022 0944 Last data filed at 11/08/2022 0440 Gross per 24 hour  Intake --  Output 2110 ml  Net -2110 ml   General: Chronically ill-appearing, lying in bed, no distress HEENT: No nasal discharge, tracheostomy present CV: Normal rate, distant heart sounds Lungs: Clear to auscultation anteriorly, on trach collar, no increased work of breathing Abd: soft, non-tender, moderate distention Skin: chronic venous stasis changes on the bilateral lower extremity Psych: alert, engaged, appropriate mood and affect Musculoskeletal: no obvious deformities Neuro: normal speech, no gross focal deficits    Test  Results I personally reviewed new and old clinical labs and radiology tests Lab Results  Component Value Date   NA 133 (L) 11/08/2022   K 2.9 (L) 11/08/2022   CL 85 (L) 11/08/2022   CO2 32 11/08/2022   BUN 83 (H) 11/08/2022   CREATININE 2.07 (H) 11/08/2022   CALCIUM 8.2 (L) 11/08/2022   ALBUMIN 3.4 (L) 11/08/2022   PHOS 4.9 (H) 11/08/2022    CBC Recent Labs  Lab 11/07/22 0928 11/08/22 0801  WBC 10.6* 10.9*  NEUTROABS 7.7 8.1*  HGB 11.8* 11.7*  HCT 43.6 43.1  MCV 77.4* 79.5*  PLT 277 246

## 2022-11-08 NOTE — Progress Notes (Signed)
Patient refused labs

## 2022-11-08 NOTE — Progress Notes (Signed)
Physical Therapy Treatment Patient Details Name: Gary [name] MRN: [id number] DOB: 1955-07-11 Today's Date: 11/08/2022   History of Present Illness 67 y.o. male presents to [facility] hospital on 10/16/2022 with reports of SOB and chest pain. Pt was in ED earlier this morning and elected to discharge home, however in route to home decided to return home for placement due to inadequate caregiver support. Of note, pt has had 4 admissions in the last 6 months, and 9 admissions in the last year. PMH includes obesity, hypoxia, respiratory failure with tracheostomy, COPD, GIB, HFpEF.    PT Comments  Pt greeted resting in bed and agreeable to session. Pt continues to be limited by decreased cardiopulmonary endurance, self-limiting behavior and resistance to education and instruction. Pt performing bed mobility with min guard for safety to come to sitting EOB and min A to return BLEs to bed at end of session. Pt with good use of bed features to self-mobilize. Pt able to come to stand from very elevated EOB, per pt request, and perform short gait into hall and back to EOB. Pt declining further gait due to pt stated fatigue. Provided education on importance of time OOB and frequent mobilization to maintain strength and improve cardiopulmonary endurance. Pt handed off to OT at end of session. Pt continues to benefit from skilled PT services to progress toward functional mobility goals.      Assistance Recommended at Discharge Frequent or constant Supervision/Assistance  If plan is discharge home, recommend the following:  Can travel by private vehicle    A lot of help with walking and/or transfers;A lot of help with bathing/dressing/bathroom;Assistance with cooking/housework;Assist for transportation;Help with stairs or ramp for entrance   No  Equipment Recommendations  Other (comment) (requesting lift chair)    Recommendations for Other Services       Precautions / Restrictions Precautions Precautions:  Fall;Other (comment) Precaution Comments: trach collar Restrictions Weight Bearing Restrictions: No RLE Weight Bearing: Weight bearing as tolerated     Mobility  Bed Mobility Overal bed mobility: Needs Assistance Bed Mobility: Supine to Sit, Sit to Supine     Supine to sit: Min guard Sit to supine: Min assist   General bed mobility comments: +rail, assist with BLE back to bed, increased time    Transfers Overall transfer level: Needs assistance Equipment used: Rolling walker (2 wheels) Transfers: Sit to/from Stand Sit to Stand: Min guard, From elevated surface           General transfer comment: pt requesting bed in very elevated position and refusing to attempt from lower height    Ambulation/Gait Ambulation/Gait assistance: +2 safety/equipment, Min guard Gait Distance (Feet): 40 Feet Assistive device: Rolling walker (2 wheels) Gait Pattern/deviations: Step-through pattern, Wide base of support, Decreased stride length Gait velocity: decreased     General Gait Details: Chair follow for safety.   Stairs             Wheelchair Mobility     Tilt Bed    Modified Rankin (Stroke Patients Only)       Balance Overall balance assessment: Needs assistance Sitting-balance support: Feet supported, No upper extremity supported Sitting balance-[name] Scale: Good Sitting balance - Comments: sitting EOB   Standing balance support: Bilateral upper extremity supported, Reliant on assistive device for balance, During functional activity Standing balance-[name] Scale: Poor Standing balance comment: B UE support for balance  Cognition Arousal/Alertness: Awake/alert Behavior During Therapy: Agitated, Anxious Overall Cognitive Status: Within Functional Limits for tasks assessed                                 General Comments: Easily agitated. Demanding. Wants to only do things his way. Not receptive to education  or instruction.        Exercises      General Comments General comments (skin integrity, edema, etc.): VSS on 12L 40% FiO2      Pertinent Vitals/Pain Pain Assessment Pain Assessment: No/denies pain Pain Intervention(s): Monitored during session    Home Living                          Prior Function            PT Goals (current goals can now be found in the care plan section) Acute Rehab PT Goals Patient Stated Goal: get stronger and go home PT Goal Formulation: With patient Time For Goal Achievement: 11/14/22 Progress towards PT goals: Not progressing toward goals - comment (self limiting)    Frequency    Min 1X/week      PT Plan Current plan remains appropriate    Co-evaluation              AM-PAC PT "6 Clicks" Mobility   Outcome Measure  Help needed turning from your back to your side while in a flat bed without using bedrails?: A Little Help needed moving from lying on your back to sitting on the side of a flat bed without using bedrails?: A Little Help needed moving to and from a bed to a chair (including a wheelchair)?: A Little Help needed standing up from a chair using your arms (e.g., wheelchair or bedside chair)?: A Little Help needed to walk in hospital room?: A Little Help needed climbing 3-5 steps with a railing? : Total 6 Click Score: 16    End of Session Equipment Utilized During Treatment: Oxygen Activity Tolerance: Treatment limited secondary to agitation Patient left: in bed;with call bell/phone within reach;Other (comment) (with OT present) Nurse Communication: Mobility status PT Visit Diagnosis: Muscle weakness (generalized) (M62.81);Difficulty in walking, not elsewhere classified (R26.2)     Time: 9629-5284 PT Time Calculation (min) (ACUTE ONLY): 18 min  Charges:    $Gait Training: 8-22 mins PT General Charges $$ ACUTE PT VISIT: 1 Visit                     Sabiha Sura R. PTA Acute Rehabilitation Services Office:  [phone]   Catalina Antigua 11/08/2022, 4:52 PM

## 2022-11-09 ENCOUNTER — Inpatient Hospital Stay (HOSPITAL_COMMUNITY): Payer: 59

## 2022-11-09 ENCOUNTER — Encounter (HOSPITAL_COMMUNITY): Payer: Self-pay | Admitting: Internal Medicine

## 2022-11-09 DIAGNOSIS — I10 Essential (primary) hypertension: Secondary | ICD-10-CM | POA: Diagnosis not present

## 2022-11-09 DIAGNOSIS — N179 Acute kidney failure, unspecified: Secondary | ICD-10-CM | POA: Diagnosis not present

## 2022-11-09 DIAGNOSIS — J441 Chronic obstructive pulmonary disease with (acute) exacerbation: Secondary | ICD-10-CM | POA: Diagnosis not present

## 2022-11-09 DIAGNOSIS — K219 Gastro-esophageal reflux disease without esophagitis: Secondary | ICD-10-CM | POA: Diagnosis not present

## 2022-11-09 DIAGNOSIS — I5033 Acute on chronic diastolic (congestive) heart failure: Secondary | ICD-10-CM | POA: Diagnosis not present

## 2022-11-09 DIAGNOSIS — Z515 Encounter for palliative care: Secondary | ICD-10-CM | POA: Diagnosis not present

## 2022-11-09 LAB — CBC WITH DIFFERENTIAL/PLATELET: Platelets: 222 10*3/uL (ref 150–400)

## 2022-11-09 MED ORDER — POTASSIUM CHLORIDE CRYS ER 20 MEQ PO TBCR: 40.0000 meq | EXTENDED_RELEASE_TABLET | Freq: Two times a day (BID) | ORAL | Status: DC

## 2022-11-09 MED ORDER — POTASSIUM CHLORIDE CRYS ER 20 MEQ PO TBCR
60.0000 meq | EXTENDED_RELEASE_TABLET | Freq: Two times a day (BID) | ORAL | Status: DC
  Administered 2022-11-09 – 2022-11-12 (×7): 60 meq via ORAL
  Filled 2022-11-09 (×7): qty 3

## 2022-11-09 MED ORDER — TORSEMIDE 20 MG PO TABS
40.0000 mg | ORAL_TABLET | Freq: Two times a day (BID) | ORAL | Status: DC
  Administered 2022-11-09 – 2022-11-12 (×8): 40 mg via ORAL
  Filled 2022-11-09 (×8): qty 2

## 2022-11-09 MED ORDER — DAPAGLIFLOZIN PROPANEDIOL 10 MG PO TABS
10.0000 mg | ORAL_TABLET | Freq: Every day | ORAL | Status: DC
  Administered 2022-11-09: 10 mg via ORAL
  Filled 2022-11-09 (×2): qty 1

## 2022-11-09 NOTE — Consult Note (Addendum)
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 to undergo if needed. I will discuss complex situation with Dr. Mayford Knife.    Risk Assessment/Risk Scores:        New York Heart Association (NYHA) Functional Class NYHA Class IV   For questions or updates, please contact [facility] Please consult [email or web address] for contact info under    Signed, Laurann Montana, PA-C  11/09/2022 1:06 PM   Patient seen and independently examined with Ronie Spies, PA. We discussed all aspects of the encounter. I agree with the assessment and plan as stated above.   This is a 67yo male with multiple chronic med problems including chronic HFpEF, COPD, morbid obesity, OSA/OHS, chronic hypoxic respiratory failure with hypoxia with trache/vent dependence, CKD 3a (baseline Cr variable, previous values averaging around 1-1.4), multiple prior GIB, gastric ulcers, abnormality of aorta seen 08/2022 (focal microdissection versus ulceration with 3.1cm AAA with recommendation for repeat CT 11/2022), HTN, prior DVT (on apixaban PTA), iron deficiency anemia,  colitis, substance use disorder (per chart, details unclear), tobacco use.. Initially admitted 10/16/22 for SOB and worsening LE edema after 2 recent hospitalizations in April and June for acute CHF.     After last hospitalization in June he did not get his home Lasix and did not take it for several days after going to rehab including some of his other meds as well.  He developed worsening SOB and LE edema and presented to the ER 7/2 and was volume overloaded.  He was diuresed but complicated by AKI on CKD stage 3a with hyponatremia.  Nephrology has been following and  SCr peaked at 2.17 a few days ago.  Had been on IV Lasix>>transitioned to TOrsemide 60mg  BID for 10 days and then held on 7/25 after SCr peaked at 2.17 and now on Torsemide 40mg  BID.  He has also had intermittent Metolazone.  His BP has been soft requiring Midodrine.  His BNP has been normal this admission but morbidly obese. 2D echo this admit with EF 55-60%, G1DD and normal RV.     On last D/C in June he weight 481lbs and on admit 450lbs.  This am had BP at 87/110mmHg but improved to 112/71mmHg later in the day.  He has been refusing blood draws and potassium.  He has not had any CP and LE edema is improved from admission and weight was down 31lbs at admission compared to 481 at last discharged.  He can lay flat without any SOB, PND, orthopnea.  He has some mild residual DOE that he thinks is related to anxiety.    GEN: Well nourished, well developed in no acute distress morbidly obese HEENT: trach in place NECK: No JVD; No carotid bruits LYMPHATICS: No lymphadenopathy CARDIAC:RRR, no murmurs, rubs, gallops RESPIRATORY:  Clear to auscultation without rales, wheezing or rhonchi  ABDOMEN: Soft, non-tender, non-distended MUSCULOSKELETAL:  No edema; No deformity  SKIN: Warm and dry NEUROLOGIC:  Alert and oriented x 3 PSYCHIATRIC:  Normal affect    EKG personally reviewed and demonstrates NSR with no ST changes   Very difficult situation.   He has acute on chronic hypoxic respiratory failure with chronic trach with severe morbid obesity with OHS/OSA.  Admitted with acute on chronic HFpEF after missing several days of diuretics after going to rehab and then going home.  He has chronic LE edema and chronic SOB likely multifactorial from chronic CHF, morbid obesity, sedentary state.     Based on review of weights in EPIC from discharge in June and this admission he was 31lbs down this admit.  BNP is unreliable in morbid obesity. SCr was 0.94 on discharge 10/16/22 and has increased to a peak of 2.1 this admit with diuresis.  His SCr is still up at 1.67 with BUN 67, and BP has been soft after further diuresis.  He has absolutely NO edema in his legs and lungs clear.  Cxray today clear.     Patient says that his LE edema has significantly improved and actually completely reoslved.  Given his morbid obesity, he is going to have chronic LE edema at baseline and would not go chasing this with high doses of diuretics and then cause worsening renal function.  I also think he will always have some SOB due to morbid obesity, COPD  and sedentary state.    2D echo this admit with normal LVF, G1DD,no PHTN and normal LVEDP and RA pressure based on echo finding.  Given all these parameters, I do not think he is volume overloaded and likely is on the dry side. I do not think he has cardiorenal syndrome at this time. Would hold diuretics for now and follow renal function and allow fluid status to normalize.  He also his on Prednisone that also can be complicating things.  If renal function continues to decline and BP drops further then could consider PICC line to measure CVP but the echo really is not c/w significant volume overload.  I do not think RHC is warrented at this time.   I also have stopped his SGLT2i as he is at high risk for fungal infections with  morbid obesity and BP is soft.  SIgned:  Armanda Magic, MD [facility] 11/09/2022

## 2022-11-09 NOTE — Progress Notes (Signed)
Patient refusing continuous pulse oximetry monitoring.

## 2022-11-09 NOTE — Progress Notes (Signed)
Patient refused Labs.

## 2022-11-09 NOTE — Progress Notes (Signed)
Nephrology Follow-Up Consult note   Assessment/Recommendations: Gary [name] is a/an 66 y.o. male with a past medical history significant for  OSA, obesity, OHS, HTN, HLD, substance use disorder, COPD, chronic hypoxic respiratory failure, CKD 3A, diastolic heart failure who presents with volume overload now w/ hypokalemia and AKI   Non-Oliguric AKI on CKD 3a: BL Crt ~1.3 but fluctuates. Now Crt up to 2.1 also with BUN elevation.  He does not look overtly dry.  Based on his weights I do not think he has been obviously over diuresed.  The ins and outs are likely not accurate given there are no documented ins on multiple days.  Is possible he has cardiorenal syndrome but also has fairly low blood pressure so possible ATN. -awaiting morning labs to guide therapy -Pausing his diuresis until hypokalemia improves then likely resume -Urine na fairly low concerning for cardiorenal -Management of acute hypoxic respiratory failure as below -Consider cardiology involvement given his issues with fluid retention -Holding SGLT2 for now but consider restarting soon -Consider further diuretics tomorrow after potassium has been improved -Continue to monitor daily Cr, Dose meds for GFR -Monitor Daily I/Os, Daily weight  -Maintain MAP>65 for optimal renal perfusion.  -Avoid nephrotoxic medications including NSAIDs -Use synthetic opioids (Fentanyl/Dilaudid) if needed -Currently no indication for HD   Acute on chronic hypoxic respiratory failure: Obesity associated hypoxia and COPD at home now w/ worsening hypoxia. CXR not impressive today for pulmonary edema but given weight has not improved significantly in the last couple weeks he likely does have some degree of overload -titrate o2 prn -plans for diuresis as above   Severe hypokalemia: Likely related to diuresis. Patient refusing IV. Continue with oral repletion. F/u labs from today   Diastolic heart failure exacerbation: Significant volume overload on  admission.  Do not think he has been diuresed as well as we thought. Plan for diuretics as above. Restart SGLT2i soon. F/u echo   COPD: bronchodilators per primary   Hyponatremia: Mild and worsening over the last few days.  Likely reflects some degree of volume   Recommendations conveyed to primary service.    Darnell Level [facility] 11/09/2022 9:08 AM  ___________________________________________________________  CC: Volume overload  Interval History/Subjective: Patient feels as though his edema has worsened. No other complaints. Refused labs this morning.   Medications:  Current Facility-Administered Medications  Medication Dose Route Frequency Provider Last Rate Last Admin   acetaminophen (TYLENOL) tablet 650 mg  650 mg Oral Q6H PRN Synetta Fail, MD   650 mg at 11/05/22 2032   apixaban (ELIQUIS) tablet 2.5 mg  2.5 mg Oral BID Synetta Fail, MD   2.5 mg at 11/08/22 2106   arformoterol (BROVANA) nebulizer solution 15 mcg  15 mcg Nebulization BID Janeann Forehand D, NP   15 mcg at 11/09/22 0738   budesonide (PULMICORT) nebulizer solution 0.5 mg  0.5 mg Nebulization BID Melody Comas B, MD   0.5 mg at 11/09/22 0735   docusate sodium (COLACE) capsule 100 mg  100 mg Oral Daily Synetta Fail, MD   100 mg at 11/08/22 1023   ferrous sulfate tablet 325 mg  325 mg Oral Q breakfast Synetta Fail, MD   325 mg at 11/09/22 1914   Gerhardt's butt cream   Topical BID Darnell Level, MD   Given at 11/08/22 2114   guaiFENesin (MUCINEX) 12 hr tablet 1,200 mg  1,200 mg Oral BID Sheikh, Omair Latif, DO   1,200 mg at  11/08/22 2106   melatonin tablet 3 mg  3 mg Oral QHS Melody Comas B, MD   3 mg at 11/08/22 2106   mesalamine (LIALDA) EC tablet 2.4 g  2.4 g Oral BID Synetta Fail, MD   2.4 g at 11/09/22 0857   midodrine (PROAMATINE) tablet 10 mg  10 mg Oral BID WC Synetta Fail, MD   10 mg at 11/09/22 1610   Oral care mouth rinse  15 mL  Mouth Rinse 4 times per day Berton Mount I, MD   15 mL at 11/09/22 0856   Oral care mouth rinse  15 mL Mouth Rinse PRN Berton Mount I, MD       pantoprazole (PROTONIX) EC tablet 40 mg  40 mg Oral QHS Synetta Fail, MD   40 mg at 11/08/22 2106   polyethylene glycol (MIRALAX / GLYCOLAX) packet 17 g  17 g Oral Daily PRN Synetta Fail, MD   17 g at 11/07/22 2140   potassium chloride SA (KLOR-CON M) CR tablet 40 mEq  40 mEq Oral BID Darnell Level, MD   40 mEq at 11/09/22 0856   predniSONE (DELTASONE) tablet 10 mg  10 mg Oral Q breakfast Synetta Fail, MD   10 mg at 11/09/22 0856   revefenacin (YUPELRI) nebulizer solution 175 mcg  175 mcg Nebulization Daily Janeann Forehand D, NP   175 mcg at 11/09/22 0738   sodium chloride flush (NS) 0.9 % injection 3 mL  3 mL Intravenous Q12H Synetta Fail, MD   3 mL at 11/09/22 0859   traMADol (ULTRAM) tablet 50 mg  50 mg Oral Q8H PRN Arrien, York Ram, MD   50 mg at 11/05/22 9604      Review of Systems: 10 systems reviewed and negative except per interval history/subjective  Physical Exam: Vitals:   11/09/22 0735 11/09/22 0738  BP:    Pulse: 82   Resp: 19   Temp:    SpO2: 95% 95%   No intake/output data recorded.  Intake/Output Summary (Last 24 hours) at 11/09/2022 0908 Last data filed at 11/09/2022 5409 Gross per 24 hour  Intake --  Output 1550 ml  Net -1550 ml   General: Chronically ill-appearing, lying in bed, no distress HEENT: No nasal discharge, tracheostomy present CV: Normal rate, distant heart sounds Lungs: bilateral chest rise, no iwob Abd: soft, non-tender, moderate distention Skin: chronic venous stasis changes on the bilateral lower extremity Psych: alert, engaged, appropriate mood and affect Musculoskeletal: no obvious deformities Neuro: normal speech, no gross focal deficits    Test Results I personally reviewed new and old clinical labs and radiology tests Lab Results  Component  Value Date   NA 133 (L) 11/08/2022   K 2.9 (L) 11/08/2022   CL 85 (L) 11/08/2022   CO2 32 11/08/2022   BUN 83 (H) 11/08/2022   CREATININE 2.07 (H) 11/08/2022   CALCIUM 8.2 (L) 11/08/2022   ALBUMIN 3.4 (L) 11/08/2022   PHOS 4.9 (H) 11/08/2022    CBC Recent Labs  Lab 11/07/22 0928 11/08/22 0801  WBC 10.6* 10.9*  NEUTROABS 7.7 8.1*  HGB 11.8* 11.7*  HCT 43.6 43.1  MCV 77.4* 79.5*  PLT 277 246

## 2022-11-09 NOTE — Progress Notes (Addendum)
Palliative Medicine Inpatient Follow Up Note   HPI: Patient is a 67 year old male, morbidly obese, past medical history significant for diverticulosis, OSA, OHS, hypertension, hyperlipidemia, GERD, substance use, depression, anxiety, anemia, COPD, rare chronic respiratory failure with hypoxia, CKD 3A, diastolic CHF presenting with shortness of breath.    Recent hospitalization 06/12 to 06/24 for respiratory failure due to pneumonia. During this admission emphasis has been on diuresis.    Palliative care consultation to further discuss goals of care.   Of note patient has had (+)5 admissions and (+) 4 ER visits within six months.    Seen multiple times by the PMT and during those times family attested that patients goals were continued to be geared towards full aggressive care.    Today's Discussion 11/09/2022  *Please note that this is a verbal dictation therefore any spelling or grammatical errors are due to the "[employer]agon [employer] One" system interpretation.  Chart reviewed inclusive of vital signs, progress notes, laboratory results, and diagnostic images.   I met with Gary [name] this afternoon. He shares with me that things have been tough lately as he questions his girlfriends intentions. He shares that she is not helpful and living off of him at this point. Gary [name] also goes on the share that she has a fairly significant drug problem. We reviewed that she is not willing to help him at home per his self report. He shares that he is looking for alternate housing as a way to get her out.   We discussed Gary [name]'s health conditions and the bigger picture associated with these. We reviewed his CKD, CPOD, and CHF. We reviewed the chronic, progressive, incurable nature of these diseases. Gary [name] feels many of his "lung problems" are anxiety provoked. I shared that he has a tremendous amount of underlying disease burden that are likely contributors as well. He states that at this point his goals are to get home  and continue on living as he has been. He has support in regards to his sisters who live locally.   Gary [name] is thankful for the active involvement of PMT and is hopeful we can see him regularly for support. I re-emphasized my role as providing  specialized medical care for people living with serious illness. It focuses on providing relief from the symptoms and stress of a serious illness. The goal is to improve quality of life for both the patient and the family.   Questions and concerns addressed/Palliative Support Provided.   Objective Assessment: Vital Signs Vitals:   11/09/22 0738 11/09/22 1155  BP:    Pulse:  72  Resp:  19  Temp:    SpO2: 95% 92%    Intake/Output Summary (Last 24 hours) at 11/09/2022 1305 Last data filed at 11/09/2022 1324 Gross per 24 hour  Intake --  Output 1550 ml  Net -1550 ml   Last Weight  Most recent update: 11/09/2022  3:53 AM    Weight  189.9 kg (418 lb 10.5 oz)              Gen: Older African-American male, obese, chronically ill in appearance HEENT: Tracheostomy, moist mucous membranes CV: Regular rate and rhythm PULM: On tracheostomy collar breathing is even and unlabored ABD: soft/nontender EXT: Generalized edema Neuro: Alert and oriented x3  SUMMARY OF RECOMMENDATIONS   Full Code/ Full Scope of Care  Discussed patients chronic disease burden   Outpatient palliative support on discharge   Appreciate TOC helping patient identify future housing options and placement  PMT will  peripherally follow along and offer support as able   Billing based on MDM: High ______________________________________________________________________________________ Gary [name] [facility] Palliative Medicine Team Team Cell Phone: [phone] Please utilize secure chat with additional questions, if there is no response within 30 minutes please call the above phone number  Palliative Medicine Team providers are available by phone from 7am to 7pm daily  and can be reached through the team cell phone.  Should this patient require assistance outside of these hours, please call the patient's attending physician.

## 2022-11-09 NOTE — Plan of Care (Signed)
?  Problem: Activity: ?Goal: Capacity to carry out activities will improve ?Outcome: Progressing ?  ?Problem: Cardiac: ?Goal: Ability to achieve and maintain adequate cardiopulmonary perfusion will improve ?Outcome: Progressing ?  ?Problem: Health Behavior/Discharge Planning: ?Goal: Ability to manage health-related needs will improve ?Outcome: Progressing ?  ?

## 2022-11-09 NOTE — Progress Notes (Signed)
PROGRESS NOTE    Gary [name]  UEA:[id number] DOB: 1955/12/20 DOA: 10/16/2022 PCP: No primary care provider on file.   Brief Narrative:  Gary [name] was admitted to the hospital with the working diagnosis of acute on chronic diastolic heart failure.    67 y.o. male with medical history significant of diverticulosis, OSA, OHS, obesity, hypertension, hyperlipidemia, GERD, substance use, depression, anxiety, anemia, COPD, rare chronic respiratory failure with hypoxia, CKD 3A, diastolic CHF presenting with shortness of breath. Recent hospitalization 06/12 to 06/24 for respiratory failure due to pneumonia.   Reported worsening dyspnea and lower extremity edema for the last 7 days prior to admission. Reported he was not able to get his medications, because he had no help at home. On his initial physical examination his blood pressure was 114/67, HR 93, RR 31 and 02 saturation 93%, trach in place,  lungs with decreased breath sounds bilaterally with bilateral rales, heart with S1 and S2 present and rhythmic, abdomen with no distention and positive lower extremity edema.   Chest radiograph with hypoinflation and poor penetration, positive cardiomegaly and bilateral hilar vascular congestion.    Patient's respiratory status continues to improve somewhat over the past 48 hours, appreciate pulmonology insight recommendations and adjustment in home nebulized medications.  At this time patient is refusing to be discharged to [facility] facility/LTAC which was her previous plan.    Plan to discharge home when sable 02 requirements, down to 6 L/min per trach collar.   07/13 pending disposition.   Interim History He was diuresed and renal function started worsening so Diuretics held and Nephrology consulted. ECHOcardiogram is being repeated and showed an EF of 55 to 60% but did show grade 1 diastolic dysfunction.  Further workup was done by nephrology and they are continue to hold his diuresis and his right  upper quadrant ultrasound was done showed no hydronephrosis and urinalysis with unremarkable without concern.  Patient's urine sodium was very low concerning for cardiorenal cardiology with also consulted and they feel that he is not very volume overloaded meaning holding diuretics and SGLT2 inhibitor at this time.  Assessment and Plan:  Acute on chronic diastolic CHF (congestive heart failure) ([name]) -Echocardiogram with preserved LV systolic function 60 to 70%, mild LVH, RV systolic function preserved, no significant valvular disease. Poor windows.  -Continued diuresis with oral diuretics using torsemide 60mg  BID but now stopped given worsening in Renal Fxn -Use metolazone intermittently. -Discontinued SGLT2 inhibitor given worsened Renal Fxn.  Intake/Output Summary (Last 24 hours) at 11/09/2022 1950 Last data filed at 11/09/2022 1741 Gross per 24 hour  Intake --  Output 3550 ml  Net -3550 ml   -Following strict intake and output; continue to follow daily weights and low-sodium diet. -Repeat echocardiogram done and showed "Left ventricular ejection fraction, by estimation, is 55 to 60%. The  left ventricle has normal function. The left ventricle has no regional wall motion abnormalities. There is moderate concentric left ventricular hypertrophy. Left ventricular  diastolic parameters are consistent with Grade I diastolic dysfunction  (impaired relaxation). Right ventricular systolic function is normal. The right ventricular size is normal. The mitral valve is normal in structure. No evidence of mitral valve regurgitation. No evidence of mitral stenosis.  The aortic valve is normal in structure. Aortic valve regurgitation is  not visualized. No aortic stenosis is present. Aortic dilatation noted. There is borderline dilatation of the ascending aorta, measuring 38 mm. The inferior vena cava is normal in size with greater than 50% respiratory variability, suggesting  right atrial pressure of 3 mmHg.  Technically limited study due to poor sound wave transmission. " -Cardiology consulted and they reviewed his echo and they feel that he is not very volume overloaded and recommends continue to hold diuretics but if the patient's renal function continues to decline and blood pressure low-salt diet and they are recommending placing a PICC line to measuring CVP   Acute on Chronic Hypoxemic Respiratory Failure: -At baseline, patient is on 6 L supplemental oxygen. -Patient is status trach placement that can be kappa. -Patient is morbidly obese. SpO2: 98 % O2 Flow Rate (L/min): 12 L/min FiO2 (%): 40 % -Patient is requiring higher than usual amount of supplemental oxygen (12 L/min). -Consider disposition to an LTAC facility to help when patient off this level supplemental oxygen if accepted -Repeat CXR this AM done and showed "Mild linear lower lung opacities, likely due to atelectasis. Cardiomegaly." -Cardiology feels that he will have some form of dyspnea due to his morbid obesity COPD and sedentary state and currently do not feel like a right heart cath is warranted at this time given that he does not appear very significantly volume overloaded   COPD (chronic obstructive pulmonary disease) ([name]) -Continue bronchodilator therapy and inhaled corticosteroids.  -Patient on chronic prednisone 10 mg daily.  -Continue trach care.  -continue PT and OT    HTN (hypertension) but now on the softer side. -Continued blood pressure support with Midodrine 10 mg p.o. twice daily. -Continue to follow vital signs. -Blood pressure stable. -Last BP reading was on the softer side at 110/74 but did drop to 87/57 earlier  Hypokalemia -Patient's K+ Level Trend: Recent Labs  Lab 11/03/22 1400 11/04/22 1359 11/05/22 0850 11/07/22 0928 11/07/22 1524 11/08/22 0801 11/09/22 0920  K 4.3 4.2 3.5 2.5* 3.3* 2.9* 3.3*  -Replete with po Kcl 40 mEQ BID  -Continue to Monitor and Replete as Necessary -Repeat CMP in  the AM   GERD without esophagitis -Continue with PPI -Lifestyle changes discussed with patient.   History of DVT (deep vein thrombosis) -Continue anticoagulation with apixaban.  -No signs of overt bleeding. -Reports no lower extremity pain currently.   Iron deficiency anemia/Microcytic Anemia -Hgb has been stable.  -Hgb/Hct Trend: Recent Labs  Lab 10/22/22 0050 10/23/22 0043 10/24/22 0925 10/31/22 0501 11/07/22 0928 11/08/22 0801 11/09/22 0920  HGB 9.7* 9.5* 9.5* 10.5* 11.8* 11.7* 10.9*  HCT 38.0* 38.1* 37.6* 40.2 43.6 43.1 41.1  MCV 84.4 83.6 82.8 80.4 77.4* 79.5* 80.7  -Check Anemia Panel in the AM, Continue to Monitor for S/Sx of Bleeding -No signs of overt bleeding appreciated. -Repeat CBC in the AM   Hyperlipidemia -Continue statin therapy.  -Heart healthy diet discussed with patient.   Chronic Colitis -Continue treatment with mesalamine.  -No signs of acute exacerbation.  -Patient reports no nausea, no vomiting and no abdominal pain at the moment. -Tolerating diet without problems.   AKI (acute kidney injury) ([name]), worsening  -Continue to closely follow renal function trend -Creatinine fluctuating with a creatinine in the 1.3-1.4 at the moment; patient renal function has been as high as 1.7--1.8 in the past. -BUN/Cr Trend: Recent Labs  Lab 11/03/22 1400 11/04/22 1359 11/05/22 0850 11/07/22 0928 11/07/22 1524 11/08/22 0801 11/09/22 0920  BUN 35* 41* 52* 72* 76* 83* 67*  CREATININE 1.44* 1.62* 1.87* 2.17* 2.02* 2.07* 1.67*  -Avoid Nephrotoxic Medications, Contrast Dyes, Hypotension and Dehydration to Ensure Adequate Renal Perfusion and will need to Renally Adjust Meds -Hold Torsemide for now -Continue to Follow  Strict I's and O's and Monitor Urine Output -Continue to Monitor and Trend Renal Function carefully and repeat CMP in the AM  -Nephrology Consulted fro further evaluation and recommendations patient underwent full workup and getting a urinalysis,  urine sodium, renal ultrasound and recommending obtaining cardiology consultation given his issues with fluid retention; nephrology recommending holding SGLT2 for now but consider restarting soon and considering further diuretics tomorrow after potassium is improved -Cardiology has recommended stopping the SGLT2 inhibitor again given the high risk for fungal infections and morbid obesity and do not feel that he is overtly volume overloaded and may be on the dry side.  They do not feel that he has cardiorenal syndrome at this time and recommending continue to hold diuretics for now Renal function and fluid status normalized. 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   Weight as of this encounter: 189.9 kg.  -Weight Loss and Dietary Counseling given and Low-calorie diet and portion control discussed with patient.   DVT prophylaxis: apixaban (ELIQUIS) tablet 2.5 mg Start: 10/17/22 2200 apixaban (ELIQUIS) tablet 2.5 mg    Code Status: Full Code Family Communication: No family present at bedside  Disposition Plan:  Level of care: Med-Surg Status is: Inpatient Remains inpatient appropriate because: Needs further clinical improvement    Consultants:  Nephrology Cardiology Palliative Care Medicine  Procedures:  ECHOCARDIOGRAM IMPRESSIONS     1. Left ventricular ejection fraction, by estimation, is 55 to 60%. The  left ventricle has normal function. The left ventricle has no regional  wall motion abnormalities. There is moderate concentric left ventricular  hypertrophy. Left ventricular  diastolic parameters are consistent with Grade I diastolic dysfunction  (impaired relaxation).   2. Right ventricular systolic function is normal. The right ventricular  size is normal.   3. The mitral valve is normal in structure. No evidence of mitral valve  regurgitation. No evidence of mitral stenosis.   4. The aortic valve is normal in structure. Aortic valve regurgitation is  not visualized. No aortic stenosis is present.   5. Aortic dilatation noted. There is borderline dilatation of the  ascending aorta, measuring 38 mm.   6. The inferior vena cava is normal in size with greater than 50%  respiratory variability, suggesting right atrial pressure of 3 mmHg.   7. Technically limited study due to poor sound wave transmission.   FINDINGS   Left Ventricle: Left ventricular ejection fraction, by estimation, is 55  to 60%. The left ventricle has normal function. The left ventricle has no  regional wall motion abnormalities. The left ventricular internal cavity  size was normal in size. There is   moderate concentric left ventricular hypertrophy. Left ventricular  diastolic parameters are consistent with Grade I diastolic dysfunction  (impaired  relaxation).   Right Ventricle: The right ventricular size is normal. No increase in  right ventricular wall thickness. Right ventricular systolic function is  normal.   Left Atrium: Left atrial size was normal in size.   Right Atrium: Right atrial size was normal in size.   Pericardium: There is no evidence of pericardial effusion.   Mitral Valve: The mitral valve is normal  in structure. No evidence of  mitral valve regurgitation. No evidence of mitral valve stenosis.   Tricuspid Valve: The tricuspid valve is normal in structure. Tricuspid  valve regurgitation is trivial. No evidence of tricuspid stenosis.   Aortic Valve: The aortic valve is normal in structure. Aortic valve  regurgitation is not visualized. No aortic stenosis is present.   Pulmonic Valve: The pulmonic valve was not well visualized. Pulmonic valve  regurgitation is not visualized. No evidence of pulmonic stenosis.   Aorta: Aortic dilatation noted. There is borderline dilatation of the  ascending aorta, measuring 38 mm.   Venous: The inferior vena cava is normal in size with greater than 50%  respiratory variability, suggesting right atrial pressure of 3 mmHg.   IAS/Shunts: No atrial level shunt detected by color flow Doppler.     LEFT VENTRICLE  PLAX 2D  LVIDd:         5.00 cm     Diastology  LVIDs:         3.70 cm     LV e' medial:    3.26 cm/s  LV PW:         1.30 cm     LV E/e' medial:  10.8  LV IVS:        1.50 cm     LV e' lateral:   8.16 cm/s  LVOT diam:     2.50 cm     LV E/e' lateral: 4.3  LV SV:         71  LV SV Index:   23  LVOT Area:     4.91 cm    LV Volumes (MOD)  LV vol d, MOD A2C: 98.7 ml  LV vol d, MOD A4C: 96.4 ml  LV vol s, MOD A2C: 40.8 ml  LV vol s, MOD A4C: 41.8 ml  LV SV MOD A2C:     57.9 ml  LV SV MOD A4C:     96.4 ml  LV SV MOD BP:      58.6 ml   RIGHT VENTRICLE             IVC  RV S prime:     13.50 cm/s  IVC diam: 1.70 cm  TAPSE (M-mode): 2.1 cm   LEFT ATRIUM             Index        RIGHT ATRIUM           Index  LA diam:        2.70 cm 0.89 cm/m   RA Area:     16.10 cm  LA Vol (A2C):   32.4 ml 10.69 ml/m  RA Volume:   40.20 ml  13.27 ml/m  LA Vol (A4C):   56.2 ml 18.55 ml/m  LA Biplane Vol: 45.7 ml 15.08 ml/m   AORTIC VALVE  LVOT Vmax:   88.30 cm/s  LVOT Vmean:  55.900 cm/s  LVOT VTI:    0.145 m    AORTA  Ao Root diam: 4.10 cm   Ao Asc diam:  3.80 cm   MITRAL  VALVE  MV Area (PHT): 2.76 cm    SHUNTS  MV Decel Time: 275 msec    Systemic VTI:  0.14 m  MV E velocity: 35.10 cm/s  Systemic Diam: 2.50 cm  MV A velocity: 66.00 cm/s  MV E/A ratio:  0.53   Antimicrobials:  Anti-infectives (From admission, onward)    None       Subjective: Seen And examined at bedside he was happy that his diet has been changed.  States his breathing is doing okay today.  No nausea or vomiting.  Wanting to work well with therapy.  Thinks some of his dyspnea is "in his head"  Objective: Vitals:   11/09/22 1155 11/09/22 1333 11/09/22 1608 11/09/22 1649  BP:  112/75  110/74  Pulse: 72 85 84 85  Resp: 19 18 19 18   Temp:  98.3 F (36.8 C)    TempSrc:      SpO2: 92% 97% 96% 98%  Weight:      Height:        Intake/Output Summary (Last 24 hours) at 11/09/2022 1957 Last data filed at 11/09/2022 1741 Gross per 24 hour  Intake --  Output 3550 ml  Net -3550 ml   Filed Weights   11/07/22 0423 11/08/22 0500 11/09/22 0329  Weight: (!) 188.9 kg (!) 189.6 kg (!) 189.9 kg   Examination: Physical Exam:  Constitutional: WN/WD super morbid obese chronically ill-appearing African-American male who appears a little bit more comfortable Respiratory: Diminished to auscultation bilaterally with some coarse breath sounds and has some slight crackles., no wheezing, rales, rhonchi or crackles. Normal respiratory effort and patient is not tachypenic. No accessory muscle use.  Wearing 11 L via trach collar now Cardiovascular: RRR, no murmurs / rubs / gallops. S1 and S2 auscultated.  Mild lower extremity edema Abdomen: Soft, non-tender, non-distended. Bowel sounds positive.  GU: Deferred. Musculoskeletal: No clubbing / cyanosis of digits/nails. No joint deformity upper and lower extremities.  Skin: No rashes, lesions, ulcers on limited skin evaluation. No induration; Warm and dry.  Neurologic: CN 2-12 grossly intact with no focal deficits.  Romberg sign and cerebellar reflexes not assessed.  Psychiatric: Normal judgment and insight. Alert and oriented x 3. Normal mood and appropriate affect.   Data Reviewed: I have personally reviewed following labs and imaging studies  CBC: Recent Labs  Lab 11/07/22 0928 11/08/22 0801 11/09/22 0920  WBC 10.6* 10.9* 11.0*  NEUTROABS 7.7 8.1* 8.3*  HGB 11.8* 11.7* 10.9*  HCT 43.6 43.1 41.1  MCV 77.4* 79.5* 80.7  PLT 277 246 222   Basic Metabolic Panel: Recent Labs  Lab 11/04/22 1359 11/05/22 0850 11/07/22 0928 11/07/22 1524 11/08/22 0801 11/09/22 0920  NA 135 134* 133* 134* 133* 135  K 4.2 3.5 2.5* 3.3* 2.9* 3.3*  CL 87* 86* 85* 86* 85* 92*  CO2 30 33* 34* 35* 32 32  GLUCOSE 172* 165* 170* 164* 176* 158*  BUN 41* 52* 72* 76* 83* 67*  CREATININE 1.62* 1.87* 2.17* 2.02* 2.07* 1.67*  CALCIUM 8.4* 8.3* 8.0* 7.8* 8.2* 8.1*  MG 2.3 2.3 2.5*  --  2.6* 2.7*  PHOS 4.7* 5.8* 6.1* 5.2* 4.9* 3.3   GFR: Estimated Creatinine Clearance: 79.8 mL/min (A) (by C-G formula based on SCr of 1.67 mg/dL (H)). Liver Function Tests: Recent Labs  Lab 11/05/22 0850 11/07/22 0928 11/07/22 1524 11/08/22 0801 11/09/22 0920  AST  --  14*  --  14* 16  ALT  --  17  --  17 16  ALKPHOS  --  119  --  108 101  BILITOT  --  0.5  --  0.7 0.6  PROT  --  8.1  --  7.7 7.4  ALBUMIN 3.5 3.5 3.5 3.4* 3.0*   No results for input(s): "LIPASE", "AMYLASE" in the last 168 hours. No results for input(s): "AMMONIA" in the last 168 hours. Coagulation Profile: No results for input(s): "INR", "PROTIME" in the last 168 hours. Cardiac Enzymes: No results for input(s): "CKTOTAL", "CKMB", "CKMBINDEX", "TROPONINI" in the last 168 hours. BNP (last 3 results) No results for input(s): "PROBNP" in the last 8760 hours. HbA1C: No results for input(s): "HGBA1C" in the last 72 hours. CBG: No results for input(s): "GLUCAP" in the last 168 hours. Lipid Profile: No results for input(s): "CHOL", "HDL", "LDLCALC", "TRIG",  "CHOLHDL", "LDLDIRECT" in the last 72 hours. Thyroid Function Tests: No results for input(s): "TSH", "T4TOTAL", "FREET4", "T3FREE", "THYROIDAB" in the last 72 hours. Anemia Panel: No results for input(s): "VITAMINB12", "FOLATE", "FERRITIN", "TIBC", "IRON", "RETICCTPCT" in the last 72 hours. Sepsis Labs: No results for input(s): "PROCALCITON", "LATICACIDVEN" in the last 168 hours.  No results found for this or any previous visit (from the past 240 hour(s)).   Radiology Studies: DG CHEST PORT 1 VIEW  Result Date: 11/09/2022 CLINICAL DATA:  Shortness of breath EXAM: PORTABLE CHEST 1 VIEW COMPARISON:  Chest x-ray dated July 25th 2024 FINDINGS: Unchanged enlarged cardiac and mediastinal contours mild linear lower lung opacities, likely due to atelectasis. No evidence of pleural effusion or pneumothorax. IMPRESSION: 1. Mild linear lower lung opacities, likely due to atelectasis. 2. Cardiomegaly. Electronically Signed   By: Allegra Lai M.D.   On: 11/09/2022 10:05   ECHOCARDIOGRAM COMPLETE  Result Date: 11/08/2022    ECHOCARDIOGRAM REPORT   Patient Name:   Gary [name] Date of Exam: 11/08/2022 Medical Rec #:  [id number]      Height:       76.0 in Accession #:    [id number]     Weight:       418.0 lb Date of Birth:  [birth year]      BSA:          3.030 m Patient Age:    67 years       BP:           94/73 mmHg Patient Gender: M              HR:           59 bpm. Exam Location:  Inpatient Procedure: 2D Echo, Cardiac Doppler and Color Doppler Indications:    R06.9 DOE  History:        Patient has prior history of Echocardiogram examinations, most                 recent 08/13/2022. COPD, Signs/Symptoms:Dyspnea and Shortness of                 Breath; Risk Factors:Hypertension. Trach collar.  Sonographer:    Sheralyn Boatman RDCS Referring Phys: [id number] [facility] LAT[facility]  Sonographer Comments: Technically difficult study due to poor echo windows and patient is obese. Image acquisition challenging due to patient body  habitus and Image acquisition challenging due to uncooperative patient. Patient was rude and talking on the  phone during exam. Patient would not allow sufficient probe pressure in apical region. Patient stated he did not have IV access. Patient stated he had a visitor coming and would not allow echo if visitor came in. IMPRESSIONS  1. Left ventricular ejection fraction, by estimation, is 55 to 60%. The left ventricle has normal function. The left ventricle has no regional wall motion abnormalities. There is moderate concentric left ventricular hypertrophy. Left ventricular diastolic parameters are consistent with Grade I diastolic dysfunction (impaired relaxation).  2. Right ventricular systolic function is normal. The right ventricular size is normal.  3. The mitral valve is normal in structure. No evidence of mitral valve regurgitation. No evidence of mitral stenosis.  4. The aortic valve is normal in structure. Aortic valve regurgitation is not visualized. No aortic stenosis is present.  5. Aortic dilatation noted. There is borderline dilatation of the ascending aorta, measuring 38 mm.  6. The inferior vena cava is normal in size with greater than 50% respiratory variability, suggesting right atrial pressure of 3 mmHg.  7. Technically limited study due to poor sound wave transmission. FINDINGS  Left Ventricle: Left ventricular ejection fraction, by estimation, is 55 to 60%. The left ventricle has normal function. The left ventricle has no regional wall motion abnormalities. The left ventricular internal cavity size was normal in size. There is  moderate concentric left ventricular hypertrophy. Left ventricular diastolic parameters are consistent with Grade I diastolic dysfunction (impaired relaxation). Right Ventricle: The right ventricular size is normal. No increase in right ventricular wall thickness. Right ventricular systolic function is normal. Left Atrium: Left atrial size was normal in size. Right  Atrium: Right atrial size was normal in size. Pericardium: There is no evidence of pericardial effusion. Mitral Valve: The mitral valve is normal in structure. No evidence of mitral valve regurgitation. No evidence of mitral valve stenosis. Tricuspid Valve: The tricuspid valve is normal in structure. Tricuspid valve regurgitation is trivial. No evidence of tricuspid stenosis. Aortic Valve: The aortic valve is normal in structure. Aortic valve regurgitation is not visualized. No aortic stenosis is present. Pulmonic Valve: The pulmonic valve was not well visualized. Pulmonic valve regurgitation is not visualized. No evidence of pulmonic stenosis. Aorta: Aortic dilatation noted. There is borderline dilatation of the ascending aorta, measuring 38 mm. Venous: The inferior vena cava is normal in size with greater than 50% respiratory variability, suggesting right atrial pressure of 3 mmHg. IAS/Shunts: No atrial level shunt detected by color flow Doppler.  LEFT VENTRICLE PLAX 2D LVIDd:         5.00 cm     Diastology LVIDs:         3.70 cm     LV e' medial:    3.26 cm/s LV PW:         1.30 cm     LV E/e' medial:  10.8 LV IVS:        1.50 cm     LV e' lateral:   8.16 cm/s LVOT diam:     2.50 cm     LV E/e' lateral: 4.3 LV SV:         71 LV SV Index:   23 LVOT Area:     4.91 cm  LV Volumes (MOD) LV vol d, MOD A2C: 98.7 ml LV vol d, MOD A4C: 96.4 ml LV vol s, MOD A2C: 40.8 ml LV vol s, MOD A4C: 41.8 ml LV SV MOD A2C:     57.9 ml LV SV MOD A4C:     96.4 ml LV SV MOD BP:      58.6 ml RIGHT VENTRICLE             IVC RV S prime:     13.50 cm/s  IVC diam: 1.70 cm TAPSE (M-mode): 2.1 cm LEFT ATRIUM             Index        RIGHT ATRIUM           Index LA diam:        2.70 cm 0.89 cm/m   RA Area:     16.10 cm LA Vol (A2C):   32.4 ml 10.69 ml/m  RA Volume:   40.20 ml  13.27 ml/m LA Vol (A4C):   56.2 ml 18.55 ml/m LA Biplane Vol: 45.7 ml 15.08 ml/m  AORTIC VALVE LVOT Vmax:   88.30 cm/s LVOT Vmean:  55.900 cm/s LVOT VTI:    0.145  m  AORTA Ao Root diam: 4.10 cm Ao Asc diam:  3.80 cm MITRAL VALVE MV Area (PHT): 2.76 cm    SHUNTS MV Decel Time: 275 msec    Systemic VTI:  0.14 m MV E velocity: 35.10 cm/s  Systemic Diam: 2.50 cm MV A velocity: 66.00 cm/s MV E/A ratio:  0.53 Arvilla Meres MD Electronically signed by Arvilla Meres MD Signature Date/Time: 11/08/2022/3:05:16 PM    Final    DG CHEST PORT 1 VIEW  Result Date: 11/08/2022 CLINICAL DATA:  Shortness of breath EXAM: PORTABLE CHEST 1 VIEW COMPARISON:  CXR 11/07/22 FINDINGS: Tracheostomy in place. No pleural effusion. No pneumothorax. Cardiomegaly. Low lung volumes. Mild pulmonary edema. Linear opacities in the bilateral mid lung fields likely represent atelectasis. No radiographically apparent displaced rib fractures. Visualized upper abdomen unremarkable. IMPRESSION: Cardiomegaly with mild pulmonary edema.  No pleural effusion. Electronically Signed   By: Lorenza Cambridge M.D.   On: 11/08/2022 07:56   US RENAL  Result Date: 11/08/2022 CLINICAL DATA:  [id number].  Acute kidney injury. EXAM: RENAL / URINARY TRACT ULTRASOUND COMPLETE COMPARISON:  CTA abdomen and pelvis GI bleed exam 09/07/2022. FINDINGS: Technical note: Limited image detail due to patient body habitus and large size attenuating the sound beam. Right Kidney: Renal measurements: 10.2 x 5.1 x 4.4 cm = volume: 120.08 mL. Echogenicity within normal limits as well as can be seen. No obvious mass, stones or hydronephrosis visualized. Left Kidney: Renal measurements: 10.5 x 5.7 x 4.1 cm = volume: 128.99 mL. Echogenicity within normal limits as well as can be seen. No mass, stones or hydronephrosis visualized. There is a 2.8 cm simple cyst in the upper pole, which was seen on the CT. Bladder: Appears normal for degree of bladder distention. Other: None. IMPRESSION: Limited ultrasound. No increased cortical echogenicity is suspected. No stones or hydronephrosis. 2.8 cm left renal cyst. Electronically Signed   By: Almira Bar  M.D.   On: 11/08/2022 02:47    Scheduled Meds:  apixaban  2.5 mg Oral BID   arformoterol  15 mcg Nebulization BID   budesonide (PULMICORT) nebulizer solution  0.5 mg Nebulization BID   docusate sodium  100 mg Oral Daily   ferrous sulfate  325 mg Oral Q breakfast   Gerhardt's butt cream   Topical BID   guaiFENesin  1,200 mg Oral BID   melatonin  3 mg Oral QHS   mesalamine  2.4 g Oral BID   midodrine  10 mg Oral BID WC   mouth rinse  15 mL Mouth Rinse 4 times per day   pantoprazole  40 mg Oral QHS   potassium chloride  60 mEq Oral BID   predniSONE  10 mg Oral Q breakfast   revefenacin  175 mcg Nebulization Daily   sodium chloride  flush  3 mL Intravenous Q12H   torsemide  40 mg Oral BID   Continuous Infusions:   LOS: 23 days   Marguerita Merles, DO [facility] Available via Epic secure chat 7am-7pm After these hours, please refer to coverage provider listed on [email or web address] 11/09/2022, 7:57 PM

## 2022-11-09 NOTE — TOC Progression Note (Addendum)
Transition of Care [facility]) - Progression Note    Patient Details  Name: Gary [name] MRN: [id number] Date of Birth: 05-23-1955  Transition of Care [facility] CM/SW Contact  Janae Bridgeman, RN Phone Number: 11/09/2022, 11:07 AM  Clinical Narrative:    CM spoke with Dr. Marland Mcalpine this morning and patient was denied for LTAC and the MD with [facility] states that patient was not stable at this time for [facility] and requested that [employer] [facility] reapply for LTAC through the Appeals process since patient has increased oxygen needs and fluid overload with Nephrology following.  I called and spoke with Claudean Severance, CM with [employer] and they will restart insurance authorization was appropriate for LTAC.  I called and spoke with [employer] and CM is sending additional paperwork for attending MD to sign to provide Private duty RN for 12 hours a day / 7 days a week [employer] authorization will be re-applied for once documents are resubmitted to [employer].  11/09/2022 - 1323 - CM met with the patient at the bedside and updated regarding the above note.  The patient states that his friend, [name], is an Charity fundraiser that is a Network engineer and she is the patient's backup caregiver who will assist as needed for trach replacements per patient.  Patient requested a new list of Low income housing and apartments - this was provided to the patient.  Patient currently lives in [place] and would like to move to another home/apartment in the future.  11/09/22 1542-  LTAC appeal signed by the attending MD and emailed back to [employer] CM.  1345 - MD signed the updated [facility] document to request 12 hours per day / 7 days a week private duty RN - and I sent faxed it to [employer] to start insurance authorization.     Expected Discharge Plan: Long Term Acute Care (LTAC) Barriers to Discharge: Continued Medical Work up  Expected Discharge Plan and Services In-house Referral: Clinical  Social Work Discharge Planning Services: CM Consult Post Acute Care Choice: Long Term Acute Care (LTAC) (LTAC versus home with private duty RN) Living arrangements for the past 2 months: Single Family Home                 DME Arranged: N/A DME Agency: NA Date DME Agency Contacted: 11/05/22 Time DME Agency Contacted: [phone] Representative spoke with at DME Agency: Kai Levins - requested that [employer] go to home to move hospital bed to living room instead of bedroom HH Arranged: PT, OT HH Agency: [facility] Date [facility] Agency Contacted: 11/05/22 Time HH Agency Contacted: 1504 Representative spoke with at [facility] Agency: [facility] declined, [facility] declined   Social Determinants of Health (SDOH) Interventions SDOH Screenings   Food Insecurity: No Food Insecurity (10/18/2022)  Housing: Patient Declined (10/18/2022)  Transportation Needs: No Transportation Needs (10/18/2022)  Recent Concern: Transportation Needs - Unmet Transportation Needs (08/13/2022)  Utilities: Not At Risk (10/18/2022)  Depression (PHQ2-9): Low Risk  (06/07/2021)  Financial Resource Strain: Medium Risk (05/03/2017)  Physical Activity: Insufficiently Active (05/03/2017)  Social Connections: Unknown (08/06/2022)   [facility], [facility]  Stress: No Stress Concern Present (05/03/2017)  Tobacco Use: Medium Risk (10/16/2022)    Readmission Risk Interventions    11/05/2022    2:59 PM 11/05/2022   12:16 PM 12/31/2021    8:58 AM  Readmission Risk Prevention Plan  Transportation Screening Complete Complete Complete  Medication Review (RN Care Manager) Complete Complete Complete  PCP or Specialist  appointment within 3-5 days of discharge Complete Complete Complete  HRI or Home Care Consult Complete Complete Complete  SW Recovery Care/Counseling Consult Complete Complete Complete  Palliative Care Screening Complete Complete Complete  Skilled Nursing Facility Not Applicable Not Applicable Not Applicable

## 2022-11-10 ENCOUNTER — Inpatient Hospital Stay (HOSPITAL_COMMUNITY): Payer: 59

## 2022-11-10 DIAGNOSIS — I5033 Acute on chronic diastolic (congestive) heart failure: Secondary | ICD-10-CM | POA: Diagnosis not present

## 2022-11-10 DIAGNOSIS — J441 Chronic obstructive pulmonary disease with (acute) exacerbation: Secondary | ICD-10-CM | POA: Diagnosis not present

## 2022-11-10 DIAGNOSIS — Z86718 Personal history of other venous thrombosis and embolism: Secondary | ICD-10-CM | POA: Diagnosis not present

## 2022-11-10 DIAGNOSIS — Z515 Encounter for palliative care: Secondary | ICD-10-CM | POA: Diagnosis not present

## 2022-11-10 DIAGNOSIS — Z7189 Other specified counseling: Secondary | ICD-10-CM | POA: Diagnosis not present

## 2022-11-10 NOTE — Progress Notes (Signed)
Nephrology Follow-Up Consult note   Assessment/Recommendations: Gary [name] is a/an 67 y.o. male with a past medical history significant for  OSA, obesity, OHS, HTN, HLD, substance use disorder, COPD, chronic hypoxic respiratory failure, CKD 3A, diastolic heart failure who presents with volume overload now w/ hypokalemia and AKI   Non-Oliguric AKI on CKD 3a: BL Crt ~1.3 but fluctuates.  Not felt to be cardiorenal syndrome by cardiology.  Likely correct.  Most likely had some degree of volume depletion and possible ATN because of intermittent low blood pressures.  Creatinine now greatly improved. -Given improvement we will sign off at this time -Defer diuretic management to cardiology -Continue to monitor daily Cr, Dose meds for GFR -Monitor Daily I/Os, Daily weight  -Maintain MAP>65 for optimal renal perfusion.  -Avoid nephrotoxic medications including NSAIDs -Use synthetic opioids (Fentanyl/Dilaudid) if needed -Currently no indication for HD   Acute on chronic hypoxic respiratory failure: Obesity associated hypoxia and COPD at home -titrate o2 prn -Management per primary team   Severe hypokalemia: Likely related to diuresis. Patient refusing IV.  Continue with repletion as needed   Diastolic heart failure exacerbation: Significant volume overload on admission.  Management of diuretics per cardiology   COPD: bronchodilators per primary   Hyponatremia: resolved  Will sign off at this time.  Please not hesitate to contact us if further help is needed.   Recommendations conveyed to primary service.    Darnell Level [facility] 11/10/2022 8:10 AM  ___________________________________________________________  CC: Volume overload  Interval History/Subjective: Patient is well today with no complaints.  Creatinine continues to improve.   Medications:  Current Facility-Administered Medications  Medication Dose Route Frequency Provider Last Rate Last Admin    acetaminophen (TYLENOL) tablet 650 mg  650 mg Oral Q6H PRN Synetta Fail, MD   650 mg at 11/05/22 2032   apixaban (ELIQUIS) tablet 2.5 mg  2.5 mg Oral BID Synetta Fail, MD   2.5 mg at 11/09/22 2127   arformoterol (BROVANA) nebulizer solution 15 mcg  15 mcg Nebulization BID Janeann Forehand D, NP   15 mcg at 11/10/22 0736   budesonide (PULMICORT) nebulizer solution 0.5 mg  0.5 mg Nebulization BID Melody Comas B, MD   0.5 mg at 11/10/22 0736   docusate sodium (COLACE) capsule 100 mg  100 mg Oral Daily Synetta Fail, MD   100 mg at 11/09/22 1220   ferrous sulfate tablet 325 mg  325 mg Oral Q breakfast Synetta Fail, MD   325 mg at 11/09/22 0630   Gerhardt's butt cream   Topical BID Darnell Level, MD   Given at 11/08/22 2114   guaiFENesin (MUCINEX) 12 hr tablet 1,200 mg  1,200 mg Oral BID Marguerita Merles [place], DO   1,200 mg at 11/09/22 2126   melatonin tablet 3 mg  3 mg Oral QHS Martina Sinner, MD   3 mg at 11/09/22 2126   mesalamine (LIALDA) EC tablet 2.4 g  2.4 g Oral BID Synetta Fail, MD   2.4 g at 11/09/22 2126   midodrine (PROAMATINE) tablet 10 mg  10 mg Oral BID WC Synetta Fail, MD   10 mg at 11/09/22 1833   Oral care mouth rinse  15 mL Mouth Rinse 4 times per day Berton Mount I, MD   15 mL at 11/09/22 2141   Oral care mouth rinse  15 mL Mouth Rinse PRN Barnetta Chapel, MD       pantoprazole (PROTONIX)  EC tablet 40 mg  40 mg Oral QHS Synetta Fail, MD   40 mg at 11/09/22 2127   polyethylene glycol (MIRALAX / GLYCOLAX) packet 17 g  17 g Oral Daily PRN Synetta Fail, MD   17 g at 11/07/22 2140   potassium chloride SA (KLOR-CON M) CR tablet 60 mEq  60 mEq Oral BID Darnell Level, MD   60 mEq at 11/09/22 1834   predniSONE (DELTASONE) tablet 10 mg  10 mg Oral Q breakfast Synetta Fail, MD   10 mg at 11/09/22 0856   revefenacin (YUPELRI) nebulizer solution 175 mcg  175 mcg Nebulization Daily Janeann Forehand D, NP   175 mcg at  11/10/22 0736   sodium chloride flush (NS) 0.9 % injection 3 mL  3 mL Intravenous Q12H Synetta Fail, MD   3 mL at 11/09/22 0859   torsemide (DEMADEX) tablet 40 mg  40 mg Oral BID Darnell Level, MD   40 mg at 11/09/22 1834   traMADol (ULTRAM) tablet 50 mg  50 mg Oral Q8H PRN Arrien, York Ram, MD   50 mg at 11/09/22 2139      Review of Systems: 10 systems reviewed and negative except per interval history/subjective  Physical Exam: Vitals:   11/10/22 0725 11/10/22 0758  BP:  125/78  Pulse: 85 64  Resp: 18 18  Temp:  97.9 F (36.6 C)  SpO2: 95% 95%   No intake/output data recorded.  Intake/Output Summary (Last 24 hours) at 11/10/2022 0810 Last data filed at 11/09/2022 1741 Gross per 24 hour  Intake --  Output 2000 ml  Net -2000 ml   General: Chronically ill-appearing, lying in bed, no distress HEENT: No nasal discharge, tracheostomy present CV: Normal rate, distant heart sounds Lungs: bilateral chest rise, no iwob Abd: soft, non-tender Skin: chronic venous stasis changes on the bilateral lower extremity Psych: alert, engaged, appropriate mood and affect Musculoskeletal: no obvious deformities Neuro: normal speech, no gross focal deficits    Test Results I personally reviewed new and old clinical labs and radiology tests Lab Results  Component Value Date   NA 140 11/10/2022   K 3.7 11/10/2022   CL 96 (L) 11/10/2022   CO2 31 11/10/2022   BUN 60 (H) 11/10/2022   CREATININE 1.57 (H) 11/10/2022   CALCIUM 8.5 (L) 11/10/2022   ALBUMIN 3.2 (L) 11/10/2022   PHOS 4.0 11/10/2022    CBC Recent Labs  Lab 11/08/22 0801 11/09/22 0920 11/10/22 0420  WBC 10.9* 11.0* 11.2*  NEUTROABS 8.1* 8.3* 7.8*  HGB 11.7* 10.9* 11.0*  HCT 43.1 41.1 41.4  MCV 79.5* 80.7 82.5  PLT 246 222 227

## 2022-11-10 NOTE — Plan of Care (Signed)
Consult note very comprehensive. Don't hesitate to reach out if you have more questions; otherwise cardiology will sign off.

## 2022-11-10 NOTE — Progress Notes (Addendum)
   Palliative Medicine Inpatient Follow Up Note   HPI: Patient is a 67 year old male, morbidly obese, past medical history significant for diverticulosis, OSA, OHS, hypertension, hyperlipidemia, GERD, substance use, depression, anxiety, anemia, COPD, rare chronic respiratory failure with hypoxia, CKD 3A, diastolic CHF presenting with shortness of breath.    Recent hospitalization 06/12 to 06/24 for respiratory failure due to pneumonia. During this admission emphasis has been on diuresis.    Palliative care consultation to further discuss goals of care.   Of note patient has had (+)5 admissions and (+) 4 ER visits within six months.    Seen multiple times by the PMT and during those times family attested that patients goals were continued to be geared towards full aggressive care.   Today's Discussion 11/10/2022  *Please note that this is a verbal dictation therefore any spelling or grammatical errors are due to the "Dragon Medical One" system interpretation.  Chart reviewed inclusive of vital signs, progress notes, laboratory results, and diagnostic images.   I met with Gary [name] this  morning. He was resting comfortably in NAD. Denies pain this morning.   Per secure chat with RN, Vikki Ports there are no concerns today.   No Family present at bedside this morning.   Goals at this time are to identify a reasonable discharge plan. Patient himself if geared towards full aggressive care.   Questions and concerns addressed/Palliative Support Provided.   Objective Assessment: Vital Signs Vitals:   11/10/22 0725 11/10/22 0758  BP:  125/78  Pulse: 85 64  Resp: 18 18  Temp:  97.9 F (36.6 C)  SpO2: 95% 95%    Intake/Output Summary (Last 24 hours) at 11/10/2022 1048 Last data filed at 11/09/2022 1741 Gross per 24 hour  Intake --  Output 2000 ml  Net -2000 ml   Last Weight  Most recent update: 11/10/2022  4:22 AM    Weight  191.7 kg (422 lb 10 oz)              Gen: Older  African-American male, obese, chronically ill in appearance HEENT: Tracheostomy, moist mucous membranes CV: Regular rate and rhythm PULM: On tracheostomy collar breathing is even and unlabored ABD: soft/nontender EXT: Generalized edema Neuro: Alert and oriented x3  SUMMARY OF RECOMMENDATIONS   Full Code/ Full Scope of Care  Outpatient palliative support on discharge   Appreciate TOC helping patient identify future housing options and placement  PMT will sign off given that goals are clear. Please reach out if additional questions or concerns arise.   Billing based on MDM: Moderate  ______________________________________________________________________________________ Gary [name] [place] [facility] Team Team Cell Phone: [phone] Please utilize secure chat with additional questions, if there is no response within 30 minutes please call the above phone number  Palliative Medicine Team providers are available by phone from 7am to 7pm daily and can be reached through the team cell phone.  Should this patient require assistance outside of these hours, please call the patient's attending physician.

## 2022-11-10 NOTE — Progress Notes (Signed)
PROGRESS NOTE    Gary [name]  ZOX:[id number] DOB: [birth year] DOA: 10/16/2022 PCP: No primary care provider on file.   Brief Narrative:  Gary [name] was admitted to the hospital with the working diagnosis of acute on chronic diastolic heart failure.    67 y.o. male with medical history significant of diverticulosis, OSA, OHS, obesity, hypertension, hyperlipidemia, GERD, substance use, depression, anxiety, anemia, COPD, rare chronic respiratory failure with hypoxia, CKD 3A, diastolic CHF presenting with shortness of breath. Recent hospitalization 06/12 to 06/24 for respiratory failure due to pneumonia.   Reported worsening dyspnea and lower extremity edema for the last 7 days prior to admission. Reported he was not able to get his medications, because he had no help at home. On his initial physical examination his blood pressure was 114/67, HR 93, RR 31 and 02 saturation 93%, trach in place,  lungs with decreased breath sounds bilaterally with bilateral rales, heart with S1 and S2 present and rhythmic, abdomen with no distention and positive lower extremity edema.   Chest radiograph with hypoinflation and poor penetration, positive cardiomegaly and bilateral hilar vascular congestion.    Patient's respiratory status continues to improve somewhat over the past 48 hours, appreciate pulmonology insight recommendations and adjustment in home nebulized medications.  At this time patient is refusing to be discharged to [facility] facility/LTAC which was her previous plan.    Plan to discharge home when sable 02 requirements, down to 6 L/min per trach collar.   07/13 pending disposition.   Interim History He was diuresed and renal function started worsening so Diuretics held and Nephrology consulted. ECHOcardiogram is being repeated and showed an EF of 55 to 60% but did show grade 1 diastolic dysfunction.  Further workup was done by nephrology and they are continue to hold his diuresis and his right  upper quadrant ultrasound was done showed no hydronephrosis and urinalysis with unremarkable without concern.  Patient's urine sodium was very low concerning for cardiorenal cardiology with also consulted and they feel that he is not very volume overloaded meaning holding diuretics and SGLT2 inhibitor at this time.  11/10/22: Nephrology signed off the case and deferring diuretic management to Cardiology given that Renal Fxn is improving.   Assessment and Plan:  Acute on Chronic diastolic CHF (congestive heart failure) ([name]) -Echocardiogram with preserved LV systolic function 60 to 70%, mild LVH, RV systolic function preserved, no significant valvular disease. Poor windows.  -Continued diuresis with oral diuretics using torsemide 60mg  BID but now stopped given worsening in Renal Fxn -Use metolazone intermittently. -Discontinued SGLT2 inhibitor given worsened Renal Fxn.  Intake/Output Summary (Last 24 hours) at 11/10/2022 1032 Last data filed at 11/09/2022 1741 Gross per 24 hour  Intake --  Output 2000 ml  Net -2000 ml   -Following strict intake and output; continue to follow daily weights and low-sodium diet. -Repeat echocardiogram done and showed "Left ventricular ejection fraction, by estimation, is 55 to 60%. The  left ventricle has normal function. The left ventricle has no regional wall motion abnormalities. There is moderate concentric left ventricular hypertrophy. Left ventricular  diastolic parameters are consistent with Grade I diastolic dysfunction  (impaired relaxation). Right ventricular systolic function is normal. The right ventricular size is normal. The mitral valve is normal in structure. No evidence of mitral valve regurgitation. No evidence of mitral stenosis.  The aortic valve is normal in structure. Aortic valve regurgitation is  not visualized. No aortic stenosis is present. Aortic dilatation noted. There is borderline dilatation of the  ascending aorta, measuring 38 mm. The  inferior vena cava is normal in size with greater than 50% respiratory variability, suggesting right atrial pressure of 3 mmHg. Technically limited study due to poor sound wave transmission. " -Cardiology consulted and they reviewed his echo and they feel that he is not very volume overloaded and recommends continue to hold diuretics but if the patient's renal function continues to decline and blood pressure low-salt diet and they are recommending placing a PICC line to measuring CVP -Nephrology signed off of the case and deferring diuresis to Cardiology   Acute on Chronic Hypoxemic Respiratory Failure: -At baseline, patient is on 6 L supplemental oxygen. -Patient is status trach placement that can be kappa. -Patient is morbidly obese. SpO2: 95 % O2 Flow Rate (L/min): 12 L/min FiO2 (%): 40 % -Patient is requiring higher than usual amount of supplemental oxygen (12 L/min). -Consider disposition to an LTAC facility to help when patient off this level supplemental oxygen if accepted -Repeat CXR this AM done and pending  -Cardiology feels that he will have some form of dyspnea due to his morbid obesity COPD and sedentary state and currently do not feel like a right heart cath is warranted at this time given that he does not appear very significantly volume overloaded   COPD (chronic obstructive pulmonary disease) ([name]) -Continue bronchodilator therapy and inhaled corticosteroids.  -Patient on chronic prednisone 10 mg daily.  -Continue trach care.  -Cntinue PT and OT as able    HTN (hypertension) but now on the softer side. -Continued blood pressure support with Midodrine 10 mg p.o. twice daily. -Continue to follow vital signs. -Blood pressure stable. -Last BP reading was 125/78  Hypokalemia -Patient's K+ Level Trend: Recent Labs  Lab 11/04/22 1359 11/05/22 0850 11/07/22 0928 11/07/22 1524 11/08/22 0801 11/09/22 0920 11/10/22 0420  K 4.2 3.5 2.5* 3.3* 2.9* 3.3* 3.7  -Replete with  po Kcl 40 mEQ BID  -Continue to Monitor and Replete as Necessary -Repeat CMP in the AM   GERD without esophagitis -Continue with PPI with Pantoprazole 40 mg po Daily  -Lifestyle changes discussed with patient.   History of DVT (deep vein thrombosis) -Continue anticoagulation with apixaban.  -No signs of overt bleeding. -Reports no lower extremity pain currently.   Iron deficiency anemia/Microcytic Anemia -Hgb has been stable.  -Hgb/Hct Trend: Recent Labs  Lab 10/23/22 0043 10/24/22 0925 10/31/22 0501 11/07/22 0928 11/08/22 0801 11/09/22 0920 11/10/22 0420  HGB 9.5* 9.5* 10.5* 11.8* 11.7* 10.9* 11.0*  HCT 38.1* 37.6* 40.2 43.6 43.1 41.1 41.4  MCV 83.6 82.8 80.4 77.4* 79.5* 80.7 82.5  -Check Anemia Panel in the AM, Continue to Monitor for S/Sx of Bleeding -No signs of overt bleeding appreciated. -Repeat CBC in the AM   Hyperlipidemia -Does not appear to be on statin therapy per [facility] -Heart healthy diet discussed with patient.   Chronic Colitis -Continue treatment with mesalamine.  -WBC Trend: Recent Labs  Lab 10/23/22 0043 10/24/22 0925 10/31/22 0501 11/07/22 0928 11/08/22 0801 11/09/22 0920 11/10/22 0420  WBC 4.7 3.8* 7.4 10.6* 10.9* 11.0* 11.2*  -No signs of acute exacerbation.  -Patient reports no nausea, no vomiting and no abdominal pain at the moment. -Tolerating diet without problems.   AKI (acute kidney injury) ([name]), worsening  -Continue to closely follow renal function trend -Creatinine fluctuating with a creatinine in the 1.3-1.4 at the moment; patient renal function has been as high as 1.7--1.8 in the past. -BUN/Cr Trend: Recent Labs  Lab 11/04/22 1359 11/05/22 0850  11/07/22 0928 11/07/22 1524 11/08/22 0801 11/09/22 0920 11/10/22 0420  BUN 41* 52* 72* 76* 83* 67* 60*  CREATININE 1.62* 1.87* 2.17* 2.02* 2.07* 1.67* 1.57*  -Avoid Nephrotoxic Medications, Contrast Dyes, Hypotension and Dehydration to Ensure Adequate Renal Perfusion and will  need to Renally Adjust Meds -Hold Torsemide for now and defer to Cardiology to restart -Continue to Follow Strict I's and O's and Monitor Urine Output -Continue to Monitor and Trend Renal Function carefully and repeat CMP in the AM  -Nephrology Consulted for further evaluation and recommendations patient underwent full workup and getting a urinalysis, urine sodium, renal ultrasound and recommending obtaining cardiology consultation given his issues with fluid retention;  -Cardiology has recommended stopping the SGLT2 inhibitor again given the high risk for fungal infections and morbid obesity and do not feel that he is overtly volume overloaded and may be on the dry side.  They do not feel that he has cardiorenal syndrome at this time and recommending continue to hold diuretics for now given that renal function and fluid status normalized. -Cardiology recommended if she continues have worsening renal function and continued decline with his blood pressures dropping and they are considering a PICC line to measure CVP given that the echo is not really consistent with significant volume overload and they do not feel that he needs a right heart cath warranted at this time -Nephrology now signed off the case and recommending diuresis per Cardiology  Leukocytosis  -Mild and Likely Reactive -WBC Trend: Recent Labs  Lab 10/23/22 0043 10/24/22 0925 10/31/22 0501 11/07/22 0928 11/08/22 0801 11/09/22 0920 11/10/22 0420  WBC 4.7 3.8* 7.4 10.6* 10.9* 11.0* 11.2*  -Continue to Monitor and Trend and repeat CBC in the AM   Hypoalbuminemia -Patient's Albumin Trend: Recent Labs  Lab 11/04/22 1359 11/05/22 0850 11/07/22 0928 11/07/22 1524 11/08/22 0801 11/09/22 0920 11/10/22 0420  ALBUMIN 3.3* 3.5 3.5 3.5 3.4* 3.0* 3.2*  -Continue to Monitor and Trend and repeat CMP in the AM  Super Morbid Obesity with BMI of 50.0-59.9, adult ([name]) -Complicates overall prognosis and care -Estimated body mass  index is 51.44 kg/m as calculated from the following:   Height as of this encounter: 6\' 4"  (1.93 m).   Weight as of this encounter: 191.7 kg.  -Weight Loss and Dietary Counseling given and Low-calorie diet and portion control discussed with patient.   DVT prophylaxis: apixaban (ELIQUIS) tablet 2.5 mg Start: 10/17/22 2200 apixaban (ELIQUIS) tablet 2.5 mg    Code Status: Full Code Family Communication: No family present at bedisde  Disposition Plan:  Level of care: Med-Surg Status is: Inpatient Remains inpatient appropriate because: Needs further clinical improvement and could benefit from [facility] given his increased O2 requirements  Consultants:  Nephrology Cardiology Palliative Care Medicine  Procedures:  ECHOCARDIOGRAM IMPRESSIONS     1. Left ventricular ejection fraction, by estimation, is 55 to 60%. The  left ventricle has normal function. The left ventricle has no regional  wall motion abnormalities. There is moderate concentric left ventricular  hypertrophy. Left ventricular  diastolic parameters are consistent with Grade I diastolic dysfunction  (impaired relaxation).   2. Right ventricular systolic function is normal. The right ventricular  size is normal.   3. The mitral valve is normal in structure. No evidence of mitral valve  regurgitation. No evidence of mitral stenosis.   4. The aortic valve is normal in structure. Aortic valve regurgitation is  not visualized. No aortic stenosis is present.   5. Aortic dilatation noted. There is  borderline dilatation of the  ascending aorta, measuring 38 mm.   6. The inferior vena cava is normal in size with greater than 50%  respiratory variability, suggesting right atrial pressure of 3 mmHg.   7. Technically limited study due to poor sound wave transmission.   FINDINGS   Left Ventricle: Left ventricular ejection fraction, by estimation, is 55  to 60%. The left ventricle has normal function. The left ventricle has no   regional wall motion abnormalities. The left ventricular internal cavity  size was normal in size. There is   moderate concentric left ventricular hypertrophy. Left ventricular  diastolic parameters are consistent with Grade I diastolic dysfunction  (impaired relaxation).   Right Ventricle: The right ventricular size is normal. No increase in  right ventricular wall thickness. Right ventricular systolic function is  normal.   Left Atrium: Left atrial size was normal in size.   Right Atrium: Right atrial size was normal in size.   Pericardium: There is no evidence of pericardial effusion.   Mitral Valve: The mitral valve is normal in structure. No evidence of  mitral valve regurgitation. No evidence of mitral valve stenosis.   Tricuspid Valve: The tricuspid valve is normal in structure. Tricuspid  valve regurgitation is trivial. No evidence of tricuspid stenosis.   Aortic Valve: The aortic valve is normal in structure. Aortic valve  regurgitation is not visualized. No aortic stenosis is present.   Pulmonic Valve: The pulmonic valve was not well visualized. Pulmonic valve  regurgitation is not visualized. No evidence of pulmonic stenosis.   Aorta: Aortic dilatation noted. There is borderline dilatation of the  ascending aorta, measuring 38 mm.   Venous: The inferior vena cava is normal in size with greater than 50%  respiratory variability, suggesting right atrial pressure of 3 mmHg.   IAS/Shunts: No atrial level shunt detected by color flow Doppler.     LEFT VENTRICLE  PLAX 2D  LVIDd:         5.00 cm     Diastology  LVIDs:         3.70 cm     LV e' medial:    3.26 cm/s  LV PW:         1.30 cm     LV E/e' medial:  10.8  LV IVS:        1.50 cm     LV e' lateral:   8.16 cm/s  LVOT diam:     2.50 cm     LV E/e' lateral: 4.3  LV SV:         71  LV SV Index:   23  LVOT Area:     4.91 cm    LV Volumes (MOD)  LV vol d, MOD A2C: 98.7 ml  LV vol d, MOD A4C: 96.4 ml  LV vol  s, MOD A2C: 40.8 ml  LV vol s, MOD A4C: 41.8 ml  LV SV MOD A2C:     57.9 ml  LV SV MOD A4C:     96.4 ml  LV SV MOD BP:      58.6 ml   RIGHT VENTRICLE             IVC  RV S prime:     13.50 cm/s  IVC diam: 1.70 cm  TAPSE (M-mode): 2.1 cm   LEFT ATRIUM             Index        RIGHT ATRIUM  Index  LA diam:        2.70 cm 0.89 cm/m   RA Area:     16.10 cm  LA Vol (A2C):   32.4 ml 10.69 ml/m  RA Volume:   40.20 ml  13.27 ml/m  LA Vol (A4C):   56.2 ml 18.55 ml/m  LA Biplane Vol: 45.7 ml 15.08 ml/m   AORTIC VALVE  LVOT Vmax:   88.30 cm/s  LVOT Vmean:  55.900 cm/s  LVOT VTI:    0.145 m    AORTA  Ao Root diam: 4.10 cm  Ao Asc diam:  3.80 cm   MITRAL VALVE  MV Area (PHT): 2.76 cm    SHUNTS  MV Decel Time: 275 msec    Systemic VTI:  0.14 m  MV E velocity: 35.10 cm/s  Systemic Diam: 2.50 cm  MV A velocity: 66.00 cm/s  MV E/A ratio:  0.53     Antimicrobials:  Anti-infectives (From admission, onward)    None       Subjective: Seen and examined at bedside and resting. Does not feel SOB today but is on 10-11 Liters of O2 via Trach. No CP or lightheadedness or dizziness. Hoping Physical Thearpy comes by today. No other concerns or complaints at this time.  Objective: Vitals:   11/10/22 0434 11/10/22 0553 11/10/22 0725 11/10/22 0758  BP: (!) 160/121 (!) 160/121  125/78  Pulse: 99  85 64  Resp: 18 18 18 18   Temp: 98.3 F (36.8 C)   97.9 F (36.6 C)  TempSrc:    Oral  SpO2: 99% 99% 95% 95%  Weight:      Height:        Intake/Output Summary (Last 24 hours) at 11/10/2022 1040 Last data filed at 11/09/2022 1741 Gross per 24 hour  Intake --  Output 2000 ml  Net -2000 ml   Filed Weights   11/08/22 0500 11/09/22 0329 11/10/22 0420  Weight: (!) 189.6 kg (!) 189.9 kg (!) 191.7 kg   Examination: Physical Exam:  Constitutional: WN/WD super morbidly obese chronically ill appearing AAM in NAD  Respiratory: Diminsihed to auscultation bilaterally with coarse  breath sounds, no wheezing, rales, rhonchi or crackles. Normal respiratory effort and patient is not tachypenic. No accessory muscle use. Wearing 11 Liters via Trach Collar.  Cardiovascular: RRR, no murmurs / rubs / gallops. S1 and S2 auscultated. Mild LE edema Abdomen: Soft, non-tender, Distended 2/2 body habitus. Bowel sounds positive.  GU: Deferred. Musculoskeletal: No clubbing / cyanosis of digits/nails. No joint deformity upper and lower extremities. .  Skin: No rashes, lesions, ulcers on a limited skin evaluation. No induration; Warm and dry.  Neurologic: CN 2-12 grossly intact with no focal deficits. Romberg sign and cerebellar reflexes not assessed.  Psychiatric: Normal judgment and insight. Alert and oriented x 3. Normal mood and appropriate affect.   Data Reviewed: I have personally reviewed following labs and imaging studies  CBC: Recent Labs  Lab 11/07/22 0928 11/08/22 0801 11/09/22 0920 11/10/22 0420  WBC 10.6* 10.9* 11.0* 11.2*  NEUTROABS 7.7 8.1* 8.3* 7.8*  HGB 11.8* 11.7* 10.9* 11.0*  HCT 43.6 43.1 41.1 41.4  MCV 77.4* 79.5* 80.7 82.5  PLT 277 246 222 227   Basic Metabolic Panel: Recent Labs  Lab 11/05/22 0850 11/07/22 0928 11/07/22 1524 11/08/22 0801 11/09/22 0920 11/10/22 0420  NA 134* 133* 134* 133* 135 140  K 3.5 2.5* 3.3* 2.9* 3.3* 3.7  CL 86* 85* 86* 85* 92* 96*  CO2 33* 34* 35*  32 32 31  GLUCOSE 165* 170* 164* 176* 158* 105*  BUN 52* 72* 76* 83* 67* 60*  CREATININE 1.87* 2.17* 2.02* 2.07* 1.67* 1.57*  CALCIUM 8.3* 8.0* 7.8* 8.2* 8.1* 8.5*  MG 2.3 2.5*  --  2.6* 2.7* 2.4  PHOS 5.8* 6.1* 5.2* 4.9* 3.3 4.0   GFR: Estimated Creatinine Clearance: 85.5 mL/min (A) (by C-G formula based on SCr of 1.57 mg/dL (H)). Liver Function Tests: Recent Labs  Lab 11/07/22 0928 11/07/22 1524 11/08/22 0801 11/09/22 0920 11/10/22 0420  AST 14*  --  14* 16 13*  ALT 17  --  17 16 15   ALKPHOS 119  --  108 101 104  BILITOT 0.5  --  0.7 0.6 0.4  PROT 8.1  --  7.7  7.4 7.2  ALBUMIN 3.5 3.5 3.4* 3.0* 3.2*   No results for input(s): "LIPASE", "AMYLASE" in the last 168 hours. No results for input(s): "AMMONIA" in the last 168 hours. Coagulation Profile: No results for input(s): "INR", "PROTIME" in the last 168 hours. Cardiac Enzymes: No results for input(s): "CKTOTAL", "CKMB", "CKMBINDEX", "TROPONINI" in the last 168 hours. BNP (last 3 results) No results for input(s): "PROBNP" in the last 8760 hours. HbA1C: No results for input(s): "HGBA1C" in the last 72 hours. CBG: No results for input(s): "GLUCAP" in the last 168 hours. Lipid Profile: No results for input(s): "CHOL", "HDL", "LDLCALC", "TRIG", "CHOLHDL", "LDLDIRECT" in the last 72 hours. Thyroid Function Tests: Recent Labs    11/10/22 0420  TSH 0.827   Anemia Panel: No results for input(s): "VITAMINB12", "FOLATE", "FERRITIN", "TIBC", "IRON", "RETICCTPCT" in the last 72 hours. Sepsis Labs: No results for input(s): "PROCALCITON", "LATICACIDVEN" in the last 168 hours.  No results found for this or any previous visit (from the past 240 hour(s)).   Radiology Studies: DG CHEST PORT 1 VIEW  Result Date: 11/09/2022 CLINICAL DATA:  Shortness of breath EXAM: PORTABLE CHEST 1 VIEW COMPARISON:  Chest x-ray dated July 25th 2024 FINDINGS: Unchanged enlarged cardiac and mediastinal contours mild linear lower lung opacities, likely due to atelectasis. No evidence of pleural effusion or pneumothorax. IMPRESSION: 1. Mild linear lower lung opacities, likely due to atelectasis. 2. Cardiomegaly. Electronically Signed   By: Allegra Lai M.D.   On: 11/09/2022 10:05   ECHOCARDIOGRAM COMPLETE  Result Date: 11/08/2022    ECHOCARDIOGRAM REPORT   Patient Name:   XYLON WELDIN Date of Exam: 11/08/2022 Medical Rec #:  [id number]      Height:       76.0 in Accession #:    [id number]     Weight:       418.0 lb Date of Birth:  1955/08/16      BSA:          3.030 m Patient Age:    67 years       BP:           94/73 mmHg  Patient Gender: M              HR:           59 bpm. Exam Location:  Inpatient Procedure: 2D Echo, Cardiac Doppler and Color Doppler Indications:    R06.9 DOE  History:        Patient has prior history of Echocardiogram examinations, most                 recent 08/13/2022. COPD, Signs/Symptoms:Dyspnea and Shortness of  Breath; Risk Factors:Hypertension. Trach collar.  Sonographer:    Sheralyn Boatman RDCS Referring Phys: [id number] [facility] LATIF [facility]  Sonographer Comments: Technically difficult study due to poor echo windows and patient is obese. Image acquisition challenging due to patient body habitus and Image acquisition challenging due to uncooperative patient. Patient was rude and talking on the  phone during exam. Patient would not allow sufficient probe pressure in apical region. Patient stated he did not have IV access. Patient stated he had a visitor coming and would not allow echo if visitor came in. IMPRESSIONS  1. Left ventricular ejection fraction, by estimation, is 55 to 60%. The left ventricle has normal function. The left ventricle has no regional wall motion abnormalities. There is moderate concentric left ventricular hypertrophy. Left ventricular diastolic parameters are consistent with Grade I diastolic dysfunction (impaired relaxation).  2. Right ventricular systolic function is normal. The right ventricular size is normal.  3. The mitral valve is normal in structure. No evidence of mitral valve regurgitation. No evidence of mitral stenosis.  4. The aortic valve is normal in structure. Aortic valve regurgitation is not visualized. No aortic stenosis is present.  5. Aortic dilatation noted. There is borderline dilatation of the ascending aorta, measuring 38 mm.  6. The inferior vena cava is normal in size with greater than 50% respiratory variability, suggesting right atrial pressure of 3 mmHg.  7. Technically limited study due to poor sound wave transmission. FINDINGS  Left Ventricle: Left  ventricular ejection fraction, by estimation, is 55 to 60%. The left ventricle has normal function. The left ventricle has no regional wall motion abnormalities. The left ventricular internal cavity size was normal in size. There is  moderate concentric left ventricular hypertrophy. Left ventricular diastolic parameters are consistent with Grade I diastolic dysfunction (impaired relaxation). Right Ventricle: The right ventricular size is normal. No increase in right ventricular wall thickness. Right ventricular systolic function is normal. Left Atrium: Left atrial size was normal in size. Right Atrium: Right atrial size was normal in size. Pericardium: There is no evidence of pericardial effusion. Mitral Valve: The mitral valve is normal in structure. No evidence of mitral valve regurgitation. No evidence of mitral valve stenosis. Tricuspid Valve: The tricuspid valve is normal in structure. Tricuspid valve regurgitation is trivial. No evidence of tricuspid stenosis. Aortic Valve: The aortic valve is normal in structure. Aortic valve regurgitation is not visualized. No aortic stenosis is present. Pulmonic Valve: The pulmonic valve was not well visualized. Pulmonic valve regurgitation is not visualized. No evidence of pulmonic stenosis. Aorta: Aortic dilatation noted. There is borderline dilatation of the ascending aorta, measuring 38 mm. Venous: The inferior vena cava is normal in size with greater than 50% respiratory variability, suggesting right atrial pressure of 3 mmHg. IAS/Shunts: No atrial level shunt detected by color flow Doppler.  LEFT VENTRICLE PLAX 2D LVIDd:         5.00 cm     Diastology LVIDs:         3.70 cm     LV e' medial:    3.26 cm/s LV PW:         1.30 cm     LV E/e' medial:  10.8 LV IVS:        1.50 cm     LV e' lateral:   8.16 cm/s LVOT diam:     2.50 cm     LV E/e' lateral: 4.3 LV SV:         71 LV SV Index:  23 LVOT Area:     4.91 cm  LV Volumes (MOD) LV vol d, MOD A2C: 98.7 ml LV vol d,  MOD A4C: 96.4 ml LV vol s, MOD A2C: 40.8 ml LV vol s, MOD A4C: 41.8 ml LV SV MOD A2C:     57.9 ml LV SV MOD A4C:     96.4 ml LV SV MOD BP:      58.6 ml RIGHT VENTRICLE             IVC RV S prime:     13.50 cm/s  IVC diam: 1.70 cm TAPSE (M-mode): 2.1 cm LEFT ATRIUM             Index        RIGHT ATRIUM           Index LA diam:        2.70 cm 0.89 cm/m   RA Area:     16.10 cm LA Vol (A2C):   32.4 ml 10.69 ml/m  RA Volume:   40.20 ml  13.27 ml/m LA Vol (A4C):   56.2 ml 18.55 ml/m LA Biplane Vol: 45.7 ml 15.08 ml/m  AORTIC VALVE LVOT Vmax:   88.30 cm/s LVOT Vmean:  55.900 cm/s LVOT VTI:    0.145 m  AORTA Ao Root diam: 4.10 cm Ao Asc diam:  3.80 cm MITRAL VALVE MV Area (PHT): 2.76 cm    SHUNTS MV Decel Time: 275 msec    Systemic VTI:  0.14 m MV E velocity: 35.10 cm/s  Systemic Diam: 2.50 cm MV A velocity: 66.00 cm/s MV E/A ratio:  0.53 Arvilla Meres MD Electronically signed by Arvilla Meres MD Signature Date/Time: 11/08/2022/3:05:16 PM    Final     Scheduled Meds:  apixaban  2.5 mg Oral BID   arformoterol  15 mcg Nebulization BID   budesonide (PULMICORT) nebulizer solution  0.5 mg Nebulization BID   docusate sodium  100 mg Oral Daily   ferrous sulfate  325 mg Oral Q breakfast   Gerhardt's butt cream   Topical BID   guaiFENesin  1,200 mg Oral BID   melatonin  3 mg Oral QHS   mesalamine  2.4 g Oral BID   midodrine  10 mg Oral BID WC   mouth rinse  15 mL Mouth Rinse 4 times per day   pantoprazole  40 mg Oral QHS   potassium chloride  60 mEq Oral BID   predniSONE  10 mg Oral Q breakfast   revefenacin  175 mcg Nebulization Daily   sodium chloride flush  3 mL Intravenous Q12H   torsemide  40 mg Oral BID   Continuous Infusions:   LOS: 24 days   Marguerita Merles, DO [facility] Available via Epic secure chat 7am-7pm After these hours, please refer to coverage provider listed on [email or web address] 11/10/2022, 10:40 AM

## 2022-11-10 NOTE — Plan of Care (Signed)
  Problem: Education: Goal: Ability to demonstrate management of disease process will improve Outcome: Progressing Goal: Ability to verbalize understanding of medication therapies will improve Outcome: Progressing   Problem: Activity: Goal: Capacity to carry out activities will improve Outcome: Progressing   Problem: Cardiac: Goal: Ability to achieve and maintain adequate cardiopulmonary perfusion will improve Outcome: Progressing   Problem: Education: Goal: Knowledge of General Education information will improve Description: Including pain rating scale, medication(s)/side effects and non-pharmacologic comfort measures Outcome: Progressing   Problem: Health Behavior/Discharge Planning: Goal: Ability to manage health-related needs will improve Outcome: Progressing   Problem: Clinical Measurements: Goal: Ability to maintain clinical measurements within normal limits will improve Outcome: Progressing

## 2022-11-11 ENCOUNTER — Inpatient Hospital Stay (HOSPITAL_COMMUNITY): Payer: 59

## 2022-11-11 DIAGNOSIS — Z86718 Personal history of other venous thrombosis and embolism: Secondary | ICD-10-CM | POA: Diagnosis not present

## 2022-11-11 DIAGNOSIS — I5033 Acute on chronic diastolic (congestive) heart failure: Secondary | ICD-10-CM | POA: Diagnosis not present

## 2022-11-11 DIAGNOSIS — J441 Chronic obstructive pulmonary disease with (acute) exacerbation: Secondary | ICD-10-CM | POA: Diagnosis not present

## 2022-11-11 LAB — COMPREHENSIVE METABOLIC PANEL WITH GFR
ALT: 16 U/L (ref 0–44)
AST: 12 U/L — ABNORMAL LOW (ref 15–41)
Albumin: 3 g/dL — ABNORMAL LOW (ref 3.5–5.0)
Alkaline Phosphatase: 106 U/L (ref 38–126)
Anion gap: 12 (ref 5–15)
BUN: 55 mg/dL — ABNORMAL HIGH (ref 8–23)
CO2: 29 mmol/L (ref 22–32)
Calcium: 8.4 mg/dL — ABNORMAL LOW (ref 8.9–10.3)
Chloride: 94 mmol/L — ABNORMAL LOW (ref 98–111)
Creatinine, Ser: 1.43 mg/dL — ABNORMAL HIGH (ref 0.61–1.24)
GFR, Estimated: 54 mL/min — ABNORMAL LOW (ref >60)
Glucose, Bld: 135 mg/dL — ABNORMAL HIGH (ref 70–99)
Potassium: 4 mmol/L (ref 3.5–5.1)
Sodium: 135 mmol/L (ref 135–145)
Total Bilirubin: 0.4 mg/dL (ref 0.3–1.2)
Total Protein: 7.2 g/dL (ref 6.5–8.1)

## 2022-11-11 LAB — VITAMIN B12: Vitamin B-12: 571 pg/mL (ref 180–914)

## 2022-11-11 LAB — CBC WITH DIFFERENTIAL/PLATELET
Abs Immature Granulocytes: 0.09 10*3/uL — ABNORMAL HIGH (ref 0.00–0.07)
Basophils Absolute: 0.1 10*3/uL (ref 0.0–0.1)
Basophils Relative: 1 %
Eosinophils Absolute: 0 10*3/uL (ref 0.0–0.5)
Eosinophils Relative: 0 %
HCT: 41.4 % (ref 39.0–52.0)
Hemoglobin: 10.9 g/dL — ABNORMAL LOW (ref 13.0–17.0)
Immature Granulocytes: 1 %
Lymphocytes Relative: 15 %
Lymphs Abs: 1.9 10*3/uL (ref 0.7–4.0)
MCH: 21.7 pg — ABNORMAL LOW (ref 26.0–34.0)
MCHC: 26.3 g/dL — ABNORMAL LOW (ref 30.0–36.0)
MCV: 82.5 fL (ref 80.0–100.0)
Monocytes Absolute: 1.2 10*3/uL — ABNORMAL HIGH (ref 0.1–1.0)
Monocytes Relative: 9 %
Neutro Abs: 9.5 10*3/uL — ABNORMAL HIGH (ref 1.7–7.7)
Neutrophils Relative %: 74 %
Platelets: 212 10*3/uL (ref 150–400)
RBC: 5.02 MIL/uL (ref 4.22–5.81)
RDW: 19.1 % — ABNORMAL HIGH (ref 11.5–15.5)
WBC: 12.7 10*3/uL — ABNORMAL HIGH (ref 4.0–10.5)
nRBC: 0 % (ref 0.0–0.2)

## 2022-11-11 LAB — RETICULOCYTES
Immature Retic Fract: 36.2 % — ABNORMAL HIGH (ref 2.3–15.9)
RBC.: 4.97 MIL/uL (ref 4.22–5.81)
Retic Count, Absolute: 88 10*3/uL (ref 19.0–186.0)
Retic Ct Pct: 1.8 % (ref 0.4–3.1)

## 2022-11-11 LAB — IRON AND TIBC
Iron: 41 ug/dL — ABNORMAL LOW (ref 45–182)
Saturation Ratios: 10 % — ABNORMAL LOW (ref 17.9–39.5)
TIBC: 398 ug/dL (ref 250–450)
UIBC: 357 ug/dL

## 2022-11-11 LAB — FOLATE: Folate: 7.2 ng/mL (ref >5.9)

## 2022-11-11 LAB — MAGNESIUM: Magnesium: 1.9 mg/dL (ref 1.7–2.4)

## 2022-11-11 LAB — FERRITIN: Ferritin: 30 ng/mL (ref 24–336)

## 2022-11-11 LAB — PHOSPHORUS: Phosphorus: 3.6 mg/dL (ref 2.5–4.6)

## 2022-11-11 NOTE — Progress Notes (Signed)
PROGRESS NOTE    Gary [name]  GNF:[id number] DOB: 15-Jan-1956 DOA: 10/16/2022 PCP: No primary care provider on file.   Brief Narrative:  Gary [name] was admitted to the hospital with the working diagnosis of acute on chronic diastolic heart failure.    67 y.o. male with medical history significant of diverticulosis, OSA, OHS, obesity, hypertension, hyperlipidemia, GERD, substance use, depression, anxiety, anemia, COPD, rare chronic respiratory failure with hypoxia, CKD 3A, diastolic CHF presenting with shortness of breath. Recent hospitalization 06/12 to 06/24 for respiratory failure due to pneumonia.   Reported worsening dyspnea and lower extremity edema for the last 7 days prior to admission. Reported he was not able to get his medications, because he had no help at home. On his initial physical examination his blood pressure was 114/67, HR 93, RR 31 and 02 saturation 93%, trach in place,  lungs with decreased breath sounds bilaterally with bilateral rales, heart with S1 and S2 present and rhythmic, abdomen with no distention and positive lower extremity edema.   Chest radiograph with hypoinflation and poor penetration, positive cardiomegaly and bilateral hilar vascular congestion.    Patient's respiratory status continues to improve somewhat over the past 48 hours, appreciate pulmonology insight recommendations and adjustment in home nebulized medications.  At this time patient is refusing to be discharged to [facility] facility/LTAC which was her previous plan.    Plan to discharge home when sable 02 requirements, down to 6 L/min per trach collar.   07/13 pending disposition.   Interim History He was diuresed and renal function started worsening so Diuretics held and Nephrology consulted. ECHOcardiogram is being repeated and showed an EF of 55 to 60% but did show grade 1 diastolic dysfunction.  Further workup was done by nephrology and they are continue to hold his diuresis and his right  upper quadrant ultrasound was done showed no hydronephrosis and urinalysis with unremarkable without concern.  Patient's urine sodium was very low concerning for cardiorenal cardiology with also consulted and they feel that he is not very volume overloaded meaning holding diuretics and SGLT2 inhibitor at this time.  11/10/22: Nephrology signed off the case and deferring diuretic management to Cardiology given that Renal Fxn is improving.   11/11/22: Cardiology also signed off the Case and Renal fxn continues to slowly improve  Assessment and Plan:  Acute on Chronic diastolic CHF (congestive heart failure) ([name]) -Echocardiogram with preserved LV systolic function 60 to 70%, mild LVH, RV systolic function preserved, no significant valvular disease. Poor windows.  -Continued diuresis with oral diuretics using torsemide 60mg  BID but now stopped given worsening in Renal Fxn -Use metolazone intermittently. -Discontinued SGLT2 inhibitor given worsened Renal Fxn.  Intake/Output Summary (Last 24 hours) at 11/11/2022 1829 Last data filed at 11/11/2022 1715 Gross per 24 hour  Intake 860 ml  Output 800 ml  Net 60 ml   -Following strict intake and output; continue to follow daily weights and low-sodium diet. -Repeat echocardiogram done and showed "Left ventricular ejection fraction, by estimation, is 55 to 60%. The  left ventricle has normal function. The left ventricle has no regional wall motion abnormalities. There is moderate concentric left ventricular hypertrophy. Left ventricular  diastolic parameters are consistent with Grade I diastolic dysfunction  (impaired relaxation). Right ventricular systolic function is normal. The right ventricular size is normal. The mitral valve is normal in structure. No evidence of mitral valve regurgitation. No evidence of mitral stenosis.  The aortic valve is normal in structure. Aortic valve regurgitation is  not visualized. No aortic stenosis is present. Aortic  dilatation noted. There is borderline dilatation of the ascending aorta, measuring 38 mm. The inferior vena cava is normal in size with greater than 50% respiratory variability, suggesting right atrial pressure of 3 mmHg. Technically limited study due to poor sound wave transmission. " -Cardiology consulted and they reviewed his echo and they feel that he is not very volume overloaded and recommended holding diuretics but if the patient's renal function continues to decline and blood pressure low-salt diet and they are recommending placing a PICC line to measuring CVP -Nephrology signed off of the case and deferring diuresis to Cardiology and Cardiology also has signed off and diuretics were resumed on 11/09/22 at 40 mg of Torsemide BID   Acute on Chronic Hypoxemic Respiratory Failure: -At baseline, patient is on 3-8 L supplemental oxygen. -Patient is status trach placement that can be capped.  -Patient is morbidly obese. SpO2: 98 % O2 Flow Rate (L/min): 12 L/min FiO2 (%): 40 % -Patient is requiring higher than usual amount of supplemental oxygen (12 L/min) still but is stable. -Consider disposition to an LTAC facility to help when patient off this level supplemental oxygen if accepted -Repeat CXR this AM done and showed "The cardiomediastinal silhouette is unchanged and enlarged in contour.Tracheostomy. No pleural effusion. No pneumothorax. Patchy linear opacities, mildly increased in comparison to prior. Background of central venous congestion, similar in comparison to prior." -Cardiology feels that he will have some form of dyspnea due to his morbid obesity COPD and sedentary state and currently do not feel like a right heart cath is warranted at this time given that he does not appear very significantly volume overloaded -May discuss with pulmonary in the morning since his diuretics have been resumed and his O2 Requirements are still very elevated   COPD (chronic obstructive pulmonary disease)  ([name]) -Continue bronchodilator therapy and inhaled corticosteroids.  -Patient on chronic prednisone 10 mg daily.  -Continue trach care.  -Will reach out to pulmonary in the morning given that the patient continues to have increased oxygen requirement -Cntinue PT and OT as able    HTN (hypertension) but now on the softer side. -Continued blood pressure support with Midodrine 10 mg p.o. twice daily. -Continue to follow vital signs. -Blood pressure stable. -Last BP reading was 111/68  Hypokalemia -Patient's K+ Level Trend: Recent Labs  Lab 11/05/22 0850 11/07/22 0928 11/07/22 1524 11/08/22 0801 11/09/22 0920 11/10/22 0420 11/11/22 0350  K 3.5 2.5* 3.3* 2.9* 3.3* 3.7 4.0  -Replete with po Kcl 40 mEQ BID  -Continue to Monitor and Replete as Necessary -Repeat CMP in the AM   GERD without esophagitis -Continue with PPI with Pantoprazole 40 mg po Daily  -Lifestyle changes discussed with patient.   History of DVT (deep vein thrombosis) -Continue anticoagulation with apixaban.  -No signs of overt bleeding. -Reports no lower extremity pain currently.   Iron deficiency anemia/Microcytic Anemia -Hgb has been stable.  -Hgb/Hct Trend: Recent Labs  Lab 10/24/22 0925 10/31/22 0501 11/07/22 0928 11/08/22 0801 11/09/22 0920 11/10/22 0420 11/11/22 0350  HGB 9.5* 10.5* 11.8* 11.7* 10.9* 11.0* 10.9*  HCT 37.6* 40.2 43.6 43.1 41.1 41.4 41.4  MCV 82.8 80.4 77.4* 79.5* 80.7 82.5 82.5  -Checked Anemia Panel and showed an iron level of 41, UIBC of 357, TIBC of 398, saturation ratios of 10%, ferritin level of 30, folate level 7.2, vitamin B12 571 -Continue to Monitor for S/Sx of Bleeding; No signs of overt bleeding appreciated. -Repeat CBC in  the AM   Hyperlipidemia -Does not appear to be on statin therapy per [facility] -Heart healthy diet discussed with patient.   Chronic Colitis -Continue treatment with Mesalamine.  -WBC Trend: Recent Labs  Lab 10/24/22 0925 10/31/22 0501  11/07/22 0928 11/08/22 0801 11/09/22 0920 11/10/22 0420 11/11/22 0350  WBC 3.8* 7.4 10.6* 10.9* 11.0* 11.2* 12.7*  -No signs of acute exacerbation.  -Patient reports no nausea, no vomiting and no abdominal pain at the moment. -Tolerating diet without problems.   AKI (acute kidney injury) ([name]), worsening  -Continue to closely follow renal function trend -Creatinine fluctuating with a creatinine in the 1.3-1.4 at the moment; patient renal function has been as high as 1.7--1.8 in the past. -BUN/Cr Trend: Recent Labs  Lab 11/05/22 0850 11/07/22 0928 11/07/22 1524 11/08/22 0801 11/09/22 0920 11/10/22 0420 11/11/22 0350  BUN 52* 72* 76* 83* 67* 60* 55*  CREATININE 1.87* 2.17* 2.02* 2.07* 1.67* 1.57* 1.43*  -Avoid Nephrotoxic Medications, Contrast Dyes, Hypotension and Dehydration to Ensure Adequate Renal Perfusion and will need to Renally Adjust Meds -Continue to Follow Strict I's and O's and Monitor Urine Output -Continue to Monitor and Trend Renal Function carefully and repeat CMP in the AM  -Nephrology Consulted for further evaluation and recommendations patient underwent full workup and getting a urinalysis, urine sodium, renal ultrasound and recommending obtaining cardiology consultation given his issues with fluid retention;  -Cardiology has recommended stopping the SGLT2 inhibitor again given the high risk for fungal infections and morbid obesity and do not feel that he is overtly volume overloaded and may be on the dry side.  They do not feel that he has cardiorenal syndrome at this time and recommending continue to hold diuretics for now given that renal function and fluid status normalized. -Cardiology recommended if she continues have worsening renal function and continued decline with his blood pressures dropping and they are considering a PICC line to measure CVP given that the echo is not really consistent with significant volume overload and they do not feel that he needs a  right heart cath warranted at this time -Nephrology now signed off the case and recommending diuresis per Cardiology and Cardiology also has signed off but his diuretics have been restarted at 40 mg po BID of Torsemide  Leukocytosis  -Mild and Likely Reactive and now worsening for unclear reasons -WBC Trend: Recent Labs  Lab 10/24/22 0925 10/31/22 0501 11/07/22 0928 11/08/22 0801 11/09/22 0920 11/10/22 0420 11/11/22 0350  WBC 3.8* 7.4 10.6* 10.9* 11.0* 11.2* 12.7*  -Continue to Monitor and Trend and repeat CBC in the AM  -If worsens further will Pan-Culture the patient   Hypoalbuminemia -Patient's Albumin Trend: Recent Labs  Lab 11/05/22 0850 11/07/22 0928 11/07/22 1524 11/08/22 0801 11/09/22 0920 11/10/22 0420 11/11/22 0350  ALBUMIN 3.5 3.5 3.5 3.4* 3.0* 3.2* 3.0*  -Continue to Monitor and Trend and repeat CMP in the AM  Super Morbid Obesity 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   Weight as of this encounter: 193.8 kg.  -Weight Loss and Dietary Counseling given and Low-calorie diet and portion control discussed with patient.   DVT prophylaxis: apixaban (ELIQUIS) tablet 2.5 mg Start: 10/17/22 2200 apixaban (ELIQUIS) tablet 2.5 mg    Code Status: Full Code Family Communication: No family Present at bedside  Disposition Plan:  Level of care: Med-Surg Status is:  Inpatient Remains inpatient appropriate because: Needs further clinical improvement and now the specialist have signed off but will need a safe discharge disposition and awaiting equipment to be delivered for home versus going to LTAC and appealing LTAC denial   Consultants:  Palliative Care Cardiology  Nephrology  Procedures:  ECHOCARDIOGRAM IMPRESSIONS     1. Left ventricular ejection fraction, by estimation, is 55 to 60%. The  left ventricle  has normal function. The left ventricle has no regional  wall motion abnormalities. There is moderate concentric left ventricular  hypertrophy. Left ventricular  diastolic parameters are consistent with Grade I diastolic dysfunction  (impaired relaxation).   2. Right ventricular systolic function is normal. The right ventricular  size is normal.   3. The mitral valve is normal in structure. No evidence of mitral valve  regurgitation. No evidence of mitral stenosis.   4. The aortic valve is normal in structure. Aortic valve regurgitation is  not visualized. No aortic stenosis is present.   5. Aortic dilatation noted. There is borderline dilatation of the  ascending aorta, measuring 38 mm.   6. The inferior vena cava is normal in size with greater than 50%  respiratory variability, suggesting right atrial pressure of 3 mmHg.   7. Technically limited study due to poor sound wave transmission.   FINDINGS   Left Ventricle: Left ventricular ejection fraction, by estimation, is 55  to 60%. The left ventricle has normal function. The left ventricle has no  regional wall motion abnormalities. The left ventricular internal cavity  size was normal in size. There is   moderate concentric left ventricular hypertrophy. Left ventricular  diastolic parameters are consistent with Grade I diastolic dysfunction  (impaired relaxation).   Right Ventricle: The right ventricular size is normal. No increase in  right ventricular wall thickness. Right ventricular systolic function is  normal.   Left Atrium: Left atrial size was normal in size.   Right Atrium: Right atrial size was normal in size.   Pericardium: There is no evidence of pericardial effusion.   Mitral Valve: The mitral valve is normal in structure. No evidence of  mitral valve regurgitation. No evidence of mitral valve stenosis.   Tricuspid Valve: The tricuspid valve is normal in structure. Tricuspid  valve regurgitation is trivial. No  evidence of tricuspid stenosis.   Aortic Valve: The aortic valve is normal in structure. Aortic valve  regurgitation is not visualized. No aortic stenosis is present.   Pulmonic Valve: The pulmonic valve was not well visualized. Pulmonic valve  regurgitation is not visualized. No evidence of pulmonic stenosis.   Aorta: Aortic dilatation noted. There is borderline dilatation of the  ascending aorta, measuring 38 mm.   Venous: The inferior vena cava is normal in size with greater than 50%  respiratory variability, suggesting right atrial pressure of 3 mmHg.   IAS/Shunts: No atrial level shunt detected by color flow Doppler.     LEFT VENTRICLE  PLAX 2D  LVIDd:         5.00 cm     Diastology  LVIDs:         3.70 cm     LV e' medial:    3.26 cm/s  LV PW:         1.30 cm     LV E/e' medial:  10.8  LV IVS:        1.50 cm     LV e' lateral:   8.16 cm/s  LVOT diam:  2.50 cm     LV E/e' lateral: 4.3  LV SV:         71  LV SV Index:   23  LVOT Area:     4.91 cm    LV Volumes (MOD)  LV vol d, MOD A2C: 98.7 ml  LV vol d, MOD A4C: 96.4 ml  LV vol s, MOD A2C: 40.8 ml  LV vol s, MOD A4C: 41.8 ml  LV SV MOD A2C:     57.9 ml  LV SV MOD A4C:     96.4 ml  LV SV MOD BP:      58.6 ml   RIGHT VENTRICLE             IVC  RV S prime:     13.50 cm/s  IVC diam: 1.70 cm  TAPSE (M-mode): 2.1 cm   LEFT ATRIUM             Index        RIGHT ATRIUM           Index  LA diam:        2.70 cm 0.89 cm/m   RA Area:     16.10 cm  LA Vol (A2C):   32.4 ml 10.69 ml/m  RA Volume:   40.20 ml  13.27 ml/m  LA Vol (A4C):   56.2 ml 18.55 ml/m  LA Biplane Vol: 45.7 ml 15.08 ml/m   AORTIC VALVE  LVOT Vmax:   88.30 cm/s  LVOT Vmean:  55.900 cm/s  LVOT VTI:    0.145 m    AORTA  Ao Root diam: 4.10 cm  Ao Asc diam:  3.80 cm   MITRAL VALVE  MV Area (PHT): 2.76 cm    SHUNTS  MV Decel Time: 275 msec    Systemic VTI:  0.14 m  MV E velocity: 35.10 cm/s  Systemic Diam: 2.50 cm  MV A velocity: 66.00 cm/s   MV E/A ratio:  0.53     Antimicrobials:  Anti-infectives (From admission, onward)    None       Subjective: Seen and examined at bedside and thinks his respiratory status is stable.  Complaining of taking "too many medications".  No nausea or vomiting.  Denies any complaints in his abdomen.  Wanting to get up again and try and ambulate.  Objective: Vitals:   11/11/22 0755 11/11/22 1146 11/11/22 1500 11/11/22 1539  BP:    111/68  Pulse: 82 85 90 80  Resp: 17 19 17 16   Temp:    98.4 F (36.9 C)  TempSrc:    Oral  SpO2: 95% 94% 95% 98%  Weight:      Height:        Intake/Output Summary (Last 24 hours) at 11/11/2022 1837 Last data filed at 11/11/2022 1715 Gross per 24 hour  Intake 860 ml  Output 800 ml  Net 60 ml   Filed Weights   11/09/22 0329 11/10/22 0420 11/11/22 0404  Weight: (!) 189.9 kg (!) 191.7 kg (!) 193.8 kg   Examination: Physical Exam:  Constitutional: WN/WD super morbidly obese chronically ill-appearing African-American male in no acute distress Respiratory: Diminished to auscultation bilaterally with coarse breath sounds and some slight crackles, no wheezing, rales, rhonchi. Normal respiratory effort and patient is not tachypenic. No accessory muscle use.  Unlabored breathing and is wearing 12 L via trach collar Cardiovascular: RRR, no murmurs / rubs / gallops. S1 and S2 auscultated.  Has mild 1+ lower extremity edema  Abdomen: Soft, non-tender, distended secondary to body habitus. Bowel sounds positive.  GU: Deferred. Musculoskeletal: No clubbing / cyanosis of digits/nails. No joint deformity upper and lower extremities.   Skin: No rashes, lesions, ulcers on limited skin evaluation. No induration; Warm and dry.  Neurologic: CN 2-12 grossly intact with no focal deficits.  Romberg sign and cerebellar reflexes not assessed.  Psychiatric: Normal judgment and insight. Alert and oriented x 3. Normal mood and appropriate affect.   Data Reviewed: I have  personally reviewed following labs and imaging studies  CBC: Recent Labs  Lab 11/07/22 0928 11/08/22 0801 11/09/22 0920 11/10/22 0420 11/11/22 0350  WBC 10.6* 10.9* 11.0* 11.2* 12.7*  NEUTROABS 7.7 8.1* 8.3* 7.8* 9.5*  HGB 11.8* 11.7* 10.9* 11.0* 10.9*  HCT 43.6 43.1 41.1 41.4 41.4  MCV 77.4* 79.5* 80.7 82.5 82.5  PLT 277 246 222 227 212   Basic Metabolic Panel: Recent Labs  Lab 11/07/22 0928 11/07/22 1524 11/08/22 0801 11/09/22 0920 11/10/22 0420 11/11/22 0350  NA 133* 134* 133* 135 140 135  K 2.5* 3.3* 2.9* 3.3* 3.7 4.0  CL 85* 86* 85* 92* 96* 94*  CO2 34* 35* 32 32 31 29  GLUCOSE 170* 164* 176* 158* 105* 135*  BUN 72* 76* 83* 67* 60* 55*  CREATININE 2.17* 2.02* 2.07* 1.67* 1.57* 1.43*  CALCIUM 8.0* 7.8* 8.2* 8.1* 8.5* 8.4*  MG 2.5*  --  2.6* 2.7* 2.4 1.9  PHOS 6.1* 5.2* 4.9* 3.3 4.0 3.6   GFR: Estimated Creatinine Clearance: 94.4 mL/min (A) (by C-G formula based on SCr of 1.43 mg/dL (H)). Liver Function Tests: Recent Labs  Lab 11/07/22 0928 11/07/22 1524 11/08/22 0801 11/09/22 0920 11/10/22 0420 11/11/22 0350  AST 14*  --  14* 16 13* 12*  ALT 17  --  17 16 15 16   ALKPHOS 119  --  108 101 104 106  BILITOT 0.5  --  0.7 0.6 0.4 0.4  PROT 8.1  --  7.7 7.4 7.2 7.2  ALBUMIN 3.5 3.5 3.4* 3.0* 3.2* 3.0*   No results for input(s): "LIPASE", "AMYLASE" in the last 168 hours. No results for input(s): "AMMONIA" in the last 168 hours. Coagulation Profile: No results for input(s): "INR", "PROTIME" in the last 168 hours. Cardiac Enzymes: No results for input(s): "CKTOTAL", "CKMB", "CKMBINDEX", "TROPONINI" in the last 168 hours. BNP (last 3 results) No results for input(s): "PROBNP" in the last 8760 hours. HbA1C: No results for input(s): "HGBA1C" in the last 72 hours. CBG: No results for input(s): "GLUCAP" in the last 168 hours. Lipid Profile: No results for input(s): "CHOL", "HDL", "LDLCALC", "TRIG", "CHOLHDL", "LDLDIRECT" in the last 72 hours. Thyroid Function  Tests: Recent Labs    11/10/22 0420  TSH 0.827   Anemia Panel: Recent Labs    11/11/22 0350  VITAMINB12 571  FOLATE 7.2  FERRITIN 30  TIBC 398  IRON 41*  RETICCTPCT 1.8   Sepsis Labs: No results for input(s): "PROCALCITON", "LATICACIDVEN" in the last 168 hours.  No results found for this or any previous visit (from the past 240 hour(s)).   Radiology Studies: DG CHEST PORT 1 VIEW  Result Date: 11/11/2022 CLINICAL DATA:  [id number] SOB (shortness of breath) [id number] EXAM: PORTABLE CHEST 1 VIEW COMPARISON:  November 10, 2022 FINDINGS: The cardiomediastinal silhouette is unchanged and enlarged in contour.Tracheostomy. No pleural effusion. No pneumothorax. Patchy linear opacities, mildly increased in comparison to prior. Background of central venous congestion, similar in comparison to prior. IMPRESSION: Mildly increased patchy linear opacities, likely atelectasis.  Electronically Signed   By: Meda Klinefelter M.D.   On: 11/11/2022 15:01   DG CHEST PORT 1 VIEW  Result Date: 11/10/2022 CLINICAL DATA:  Shortness of breath. EXAM: PORTABLE CHEST 1 VIEW COMPARISON:  November 09, 2022. FINDINGS: Stable cardiomegaly. Tracheostomy tube is in grossly good position. Mildly increased bilateral perihilar and basilar interstitial densities are noted concerning for atelectasis or possibly edema. Old left rib fractures are noted. IMPRESSION: Mildly increased bilateral lung opacities are noted concerning for atelectasis or possibly edema. Electronically Signed   By: Lupita Raider M.D.   On: 11/10/2022 12:25    Scheduled Meds:  apixaban  2.5 mg Oral BID   arformoterol  15 mcg Nebulization BID   budesonide (PULMICORT) nebulizer solution  0.5 mg Nebulization BID   docusate sodium  100 mg Oral Daily   ferrous sulfate  325 mg Oral Q breakfast   Gerhardt's butt cream   Topical BID   guaiFENesin  1,200 mg Oral BID   melatonin  3 mg Oral QHS   mesalamine  2.4 g Oral BID   midodrine  10 mg Oral BID WC   mouth rinse   15 mL Mouth Rinse 4 times per day   pantoprazole  40 mg Oral QHS   potassium chloride  60 mEq Oral BID   predniSONE  10 mg Oral Q breakfast   revefenacin  175 mcg Nebulization Daily   sodium chloride flush  3 mL Intravenous Q12H   torsemide  40 mg Oral BID   Continuous Infusions:   LOS: 25 days   Marguerita Merles, DO [facility] Available via Epic secure chat 7am-7pm After these hours, please refer to coverage provider listed on [email or web address] 11/11/2022, 6:37 PM

## 2022-11-11 NOTE — Progress Notes (Signed)
Placed patient on home astral vent with 10L bleed in. Sat 99% and HR 78. Patient states he wants to come off the vent at 0600, I will be there to take him off at that time.   Nelda Marseille

## 2022-11-11 NOTE — Plan of Care (Signed)
  Problem: Activity: Goal: Capacity to carry out activities will improve Outcome: Progressing   Problem: Cardiac: Goal: Ability to achieve and maintain adequate cardiopulmonary perfusion will improve Outcome: Progressing   Problem: Clinical Measurements: Goal: Ability to maintain clinical measurements within normal limits will improve Outcome: Progressing

## 2022-11-12 ENCOUNTER — Institutional Professional Consult (permissible substitution)
Admission: RE | Admit: 2022-11-12 | Discharge: 2022-11-20 | Disposition: A | Discharge status: Home / Self Care | Payer: 59 | Attending: Internal Medicine | Admitting: Internal Medicine

## 2022-11-12 ENCOUNTER — Inpatient Hospital Stay (HOSPITAL_COMMUNITY): Payer: 59

## 2022-11-12 DIAGNOSIS — I10 Essential (primary) hypertension: Secondary | ICD-10-CM | POA: Diagnosis not present

## 2022-11-12 DIAGNOSIS — I5033 Acute on chronic diastolic (congestive) heart failure: Secondary | ICD-10-CM | POA: Diagnosis not present

## 2022-11-12 DIAGNOSIS — J441 Chronic obstructive pulmonary disease with (acute) exacerbation: Secondary | ICD-10-CM | POA: Diagnosis not present

## 2022-11-12 MED ORDER — MELATONIN 3 MG PO TABS: 3.0000 mg | ORAL_TABLET | Freq: Every day | ORAL | Status: DC

## 2022-11-12 MED ORDER — REVEFENACIN 175 MCG/3ML IN SOLN: 175.0000 ug | Freq: Every day | RESPIRATORY_TRACT | Status: DC

## 2022-11-12 MED ORDER — BUDESONIDE 0.5 MG/2ML IN SUSP: 0.5000 mg | Freq: Two times a day (BID) | RESPIRATORY_TRACT | Status: DC

## 2022-11-12 MED ORDER — GERHARDT'S BUTT CREAM: 1.0000 | TOPICAL_CREAM | Freq: Two times a day (BID) | CUTANEOUS | Status: DC

## 2022-11-12 MED ORDER — POTASSIUM CHLORIDE CRYS ER 20 MEQ PO TBCR: 60.0000 meq | EXTENDED_RELEASE_TABLET | Freq: Two times a day (BID) | ORAL | Status: DC

## 2022-11-12 MED ORDER — GUAIFENESIN ER 600 MG PO TB12: 1200.0000 mg | ORAL_TABLET | Freq: Two times a day (BID) | ORAL | Status: DC

## 2022-11-12 MED ORDER — TRAMADOL HCL 50 MG PO TABS: 50.0000 mg | ORAL_TABLET | Freq: Three times a day (TID) | ORAL | Status: DC | PRN

## 2022-11-12 MED ORDER — TORSEMIDE 40 MG PO TABS: 40.0000 mg | ORAL_TABLET | Freq: Two times a day (BID) | ORAL | Status: DC

## 2022-11-12 MED ORDER — ORAL CARE MOUTH RINSE: 15.0000 mL | Freq: Four times a day (QID) | OROMUCOSAL | Status: DC

## 2022-11-12 MED ORDER — ARFORMOTEROL TARTRATE 15 MCG/2ML IN NEBU: 15.0000 ug | INHALATION_SOLUTION | Freq: Two times a day (BID) | RESPIRATORY_TRACT | Status: DC

## 2022-11-12 NOTE — Progress Notes (Addendum)
RT NOTE: RT called to room by RN due to patient wanting to come off home bipap. When RT arrived to room, PT already off bipap and on 40% ATC with sats of 98%. PT wearing PMV and eating breakfast. PT is not on continuous pulse ox and would not let this RT place probe on finger- he has portable pulse ox on bedside table that he puts on to check sats. PT stated "I'm not wearing that thing-yall have too many wires on me". RT explained to patient the importance of being on continuous pulse ox but could not get patient to wear.

## 2022-11-12 NOTE — TOC Progression Note (Addendum)
Transition of Care [facility]) - Progression Note    Patient Details  Name: Gary [name] MRN: [id number] Date of Birth: 27-Jun-1955  Transition of Care [facility]) CM/SW Contact  Janae Bridgeman, RN Phone Number: 11/12/2022, 11:28 AM  Clinical Narrative:    CM spoke with Maggie Schwalbe , CM with [facility] and patient received insurance authorization approval for LTAc placement and the facility has an available bed today.  Attending MD was notified of available bed today - pending discharge if medically ready.  Bedside nursing - please call report to [facility] at [phone].  [facility] was updated regarding patient's discharge to [facility] today.  I asked that [facility] care [phone] reach out to CM at [facility] for follow up regarding pending insurance authorization for private duty RN in the home - [employer] was in the process to request 120 hours of Private duty nursing in the home through [place].   Expected Discharge Plan: Long Term Acute Care (LTAC) Barriers to Discharge: Continued Medical Work up  Expected Discharge Plan and Services In-house Referral: Clinical Social Work Discharge Planning Services: CM Consult Post Acute Care Choice: Long Term Acute Care (LTAC) (LTAC versus home with private duty RN) Living arrangements for the past 2 months: Single Family Home                 DME Arranged: N/A DME Agency: NA Date DME Agency Contacted: 11/05/22 Time DME Agency Contacted: [phone] Representative spoke with at DME Agency: Kai Levins - requested that [employer] go to home to move hospital bed to living room instead of bedroom HH Arranged: PT, OT HH Agency: [facility] Date [facility] Agency Contacted: 11/05/22 Time HH Agency Contacted: [phone] Representative spoke with at [facility] Agency: [facility] declined, [facility] declined   Social Determinants of Health (SDOH) Interventions SDOH Screenings   Food Insecurity:  No Food Insecurity (10/18/2022)  Housing: Patient Declined (10/18/2022)  Transportation Needs: No Transportation Needs (10/18/2022)  Recent Concern: Transportation Needs - Unmet Transportation Needs (08/13/2022)  Utilities: Not At Risk (10/18/2022)  Depression (PHQ2-9): Low Risk  (06/07/2021)  Financial Resource Strain: Medium Risk (05/03/2017)  Physical Activity: Insufficiently Active (05/03/2017)  Social Connections: Unknown (08/06/2022)   Received from [facility]  Stress: No Stress Concern Present (05/03/2017)  Tobacco Use: Medium Risk (10/16/2022)    Readmission Risk Interventions    11/05/2022    2:59 PM 11/05/2022   12:16 PM 12/31/2021    8:58 AM  Readmission Risk Prevention Plan  Transportation Screening Complete Complete Complete  Medication Review [employer]) Complete Complete Complete  PCP or Specialist appointment within 3-5 days of discharge Complete Complete Complete  HRI or Home Care Consult Complete Complete Complete  SW Recovery Care/Counseling Consult Complete Complete Complete  Palliative Care Screening Complete Complete Complete  Skilled Nursing Facility Not Applicable Not Applicable Not Applicable

## 2022-11-12 NOTE — Progress Notes (Signed)
Called report to April at [facility].

## 2022-11-12 NOTE — Plan of Care (Signed)
  Problem: Education: Goal: Ability to demonstrate management of disease process will improve Outcome: Progressing Goal: Ability to verbalize understanding of medication therapies will improve Outcome: Progressing   Problem: Activity: Goal: Capacity to carry out activities will improve Outcome: Progressing   Problem: Cardiac: Goal: Ability to achieve and maintain adequate cardiopulmonary perfusion will improve Outcome: Progressing   Problem: Education: Goal: Knowledge of General Education information will improve Description: Including pain rating scale, medication(s)/side effects and non-pharmacologic comfort measures Outcome: Progressing   Problem: Health Behavior/Discharge Planning: Goal: Ability to manage health-related needs will improve Outcome: Progressing   Problem: Clinical Measurements: Goal: Ability to maintain clinical measurements within normal limits will improve Outcome: Progressing

## 2022-11-12 NOTE — Discharge Instructions (Signed)
Information on my medicine - ELIQUIS (apixaban)  This medication education was reviewed with me or my healthcare representative as part of my discharge preparation.    Why was Eliquis prescribed for you? Eliquis was prescribed for you to reduce the risk of blood clots forming.    What do You need to know about Eliquis? Take your Eliquis TWICE DAILY - one tablet in the morning and one tablet in the evening with or without food.  It would be best to take the dose about the same time each day.  If you have difficulty swallowing the tablet whole please discuss with your pharmacist how to take the medication safely.  Take Eliquis exactly as prescribed by your doctor and DO NOT stop taking Eliquis without talking to the doctor who prescribed the medication.  Stopping without other medication to take the place of Eliquis may increase your risk of developing a clot.  After discharge, you should have regular check-up appointments with your healthcare provider that is prescribing your Eliquis.  What do you do if you miss a dose? If a dose of ELIQUIS is not taken at the scheduled time, take it as soon as possible on the same day and twice-daily administration should be resumed.  The dose should not be doubled to make up for a missed dose.  Do not take more than one tablet of ELIQUIS at the same time.  Important Safety Information A possible side effect of Eliquis is bleeding. You should call your healthcare provider right away if you experience any of the following: Bleeding from an injury or your nose that does not stop. Unusual colored urine (red or dark brown) or unusual colored stools (red or black). Unusual bruising for unknown reasons. A serious fall or if you hit your head (even if there is no bleeding).  Some medicines may interact with Eliquis and might increase your risk of bleeding or clotting while on Eliquis. To help avoid this, consult your healthcare provider or pharmacist  prior to using any new prescription or non-prescription medications, including herbals, vitamins, non-steroidal anti-inflammatory drugs (NSAIDs) and supplements.  This website has more information on Eliquis (apixaban): http://[email or web address].com/eliquis/home

## 2022-11-12 NOTE — Discharge Summary (Signed)
Physician Discharge Summary   Patient: Gary [name] MRN: [id number] DOB: 1956/01/18  Admit date:     10/16/2022  Discharge date: 11/12/22  Discharge Physician: Marguerita Merles. DO   PCP: No primary care provider on file.   Recommendations at discharge:   Follow-up with PCP within 1 to 2 weeks and repeat CBC, CMP, mag, Phos within 1 week Follow-up with cardiology in outpatient setting Follow-up with nephrology in outpatient setting Continue weaning of supplemental oxygen via trach collar to baseline of 6 L  Discharge Diagnoses: Active Problems:   Acute on chronic diastolic CHF (congestive heart failure) ([name])   COPD (chronic obstructive pulmonary disease) ([name])   HTN (hypertension)   Morbid obesity with BMI of 50.0-59.9, adult ([name])   GERD without esophagitis   Iron deficiency anemia   History of DVT (deep vein thrombosis)   Hyperlipidemia   Polysubstance abuse ([name])   Chronic colitis   AKI (acute kidney injury) ([name])   Acute on chronic respiratory failure ([name])  Resolved Problems:   * No resolved hospital problems. Gary [name] Hospital Course: Gary [name] was admitted to the hospital with the working diagnosis of acute on chronic diastolic heart failure.    67 y.o. male with medical history significant of diverticulosis, OSA, OHS, obesity, hypertension, hyperlipidemia, GERD, substance use, depression, anxiety, anemia, COPD, rare chronic respiratory failure with hypoxia, CKD 3A, diastolic CHF presenting with shortness of breath. Recent hospitalization 06/12 to 06/24 for respiratory failure due to pneumonia.   Reported worsening dyspnea and lower extremity edema for the last 7 days prior to admission. Reported he was not able to get his medications, because he had no help at home. On his initial physical examination his blood pressure was 114/67, HR 93, RR 31 and 02 saturation 93%, trach in place,  lungs with decreased breath sounds bilaterally with bilateral rales, heart with S1 and S2  present and rhythmic, abdomen with no distention and positive lower extremity edema.   Chest radiograph with hypoinflation and poor penetration, positive cardiomegaly and bilateral hilar vascular congestion.    Patient's respiratory status continues to improve somewhat over the past 48 hours, appreciate pulmonology insight recommendations and adjustment in home nebulized medications.  At this time patient is refusing to be discharged to [facility] facility/[facility] which was her previous plan.    Plan to discharge home when sable 02 requirements, down to 6 L/min per trach collar.   07/13 pending disposition.   Interim History He was diuresed and renal function started worsening so Diuretics held and Nephrology consulted. ECHOcardiogram is being repeated and showed an EF of 55 to 60% but did show grade 1 diastolic dysfunction.  Further workup was done by nephrology and they are continue to hold his diuresis and his right upper quadrant ultrasound was done showed no hydronephrosis and urinalysis with unremarkable without concern.  Patient's urine sodium was very low concerning for cardiorenal cardiology with also consulted and they feel that he is not very volume overloaded meaning holding diuretics and SGLT2 inhibitor at this time.  11/10/22: Nephrology signed off the case and deferring diuretic management to Cardiology given that Renal Fxn is improving.   11/11/22: Cardiology also signed off the Case and Renal fxn continues to slowly improve  11/12/2022 he is stable to be transferred to [facility] as he has received insurance authorization approval.  Will be discharging today and will need continued weaning of his oxygen requirements with improvement in his respiratory status and mobilization.  Assessment and Plan:  Acute on Chronic diastolic CHF (congestive heart failure) ([name]) -Echocardiogram with preserved LV systolic function 60 to 70%, mild LVH, RV systolic function preserved, no  significant valvular disease. Poor windows.  -Continued diuresis with oral diuretics using torsemide 60mg  BID but now stopped given worsening in Renal Fxn -Use metolazone intermittently. -Discontinued SGLT2 inhibitor given worsened Renal Fxn.  Intake/Output Summary (Last 24 hours) at 11/12/2022 1323 Last data filed at 11/12/2022 1133 Gross per 24 hour  Intake 120 ml  Output 2300 ml  Net -2180 ml   -Following strict intake and output; continue to follow daily weights and low-sodium diet. -Repeat echocardiogram done and showed "Left ventricular ejection fraction, by estimation, is 55 to 60%. The  left ventricle has normal function. The left ventricle has no regional wall motion abnormalities. There is moderate concentric left ventricular hypertrophy. Left ventricular  diastolic parameters are consistent with Grade I diastolic dysfunction  (impaired relaxation). Right ventricular systolic function is normal. The right ventricular size is normal. The mitral valve is normal in structure. No evidence of mitral valve regurgitation. No evidence of mitral stenosis.  The aortic valve is normal in structure. Aortic valve regurgitation is  not visualized. No aortic stenosis is present. Aortic dilatation noted. There is borderline dilatation of the ascending aorta, measuring 38 mm. The inferior vena cava is normal in size with greater than 50% respiratory variability, suggesting right atrial pressure of 3 mmHg. Technically limited study due to poor sound wave transmission. " -Cardiology consulted and they reviewed his echo and they feel that he is not very volume overloaded and recommended holding diuretics but if the patient's renal function continues to decline and blood pressure low-salt diet and they are recommending placing a PICC line to measuring CVP -Nephrology signed off of the case and deferring diuresis to Cardiology and Cardiology also has signed off and diuretics were resumed on 11/09/22 at 40 mg of  Torsemide BID -Will need follow-up after discharge with nephrology and cardiology   Acute on Chronic Hypoxemic Respiratory Failure: -At baseline, patient is on 3-8 L supplemental oxygen. -Patient is status trach placement that can be capped.  -Patient is morbidly obese. SpO2: 95 % O2 Flow Rate (L/min): 10 L/min FiO2 (%): 40 % -Patient is requiring higher than usual amount of supplemental oxygen (12 L/min) still but is stable. -Consider disposition to an LTAC facility to help when patient off this level supplemental oxygen if accepted -Repeat CXR this AM done and showed "The cardiomediastinal silhouette is unchanged and enlarged in contour.Tracheostomy. No pleural effusion. No pneumothorax. Patchy linear opacities, mildly increased in comparison to prior. Background of central venous congestion, similar in comparison to prior." -Cardiology feels that he will have some form of dyspnea due to his morbid obesity COPD and sedentary state and currently do not feel like a right heart cath is warranted at this time given that he does not appear very significantly volume overloaded -Have discussion with Pulmonary since his diuretics have been resumed and his O2 Requirements are still very elevated   COPD (chronic obstructive pulmonary disease) ([name]) -Continue bronchodilator therapy and inhaled corticosteroids.  -Patient on chronic prednisone 10 mg daily.  -Continue trach care.  -Will reach out to pulmonary in the morning given that the patient continues to have increased oxygen requirement -Cntinue PT and OT as able  -Follow up with Pulmonary   HTN (hypertension) but now on the softer side. -Continued blood pressure support with Midodrine 10 mg p.o. twice daily. -Continue to follow vital signs. -Blood  pressure stable. -Last BP reading was 115/73  Hypokalemia -Patient's K+ Level Trend: Recent Labs  Lab 11/05/22 0850 11/07/22 0928 11/07/22 1524 11/08/22 0801 11/09/22 0920 11/10/22 0420  11/11/22 0350  K 3.5 2.5* 3.3* 2.9* 3.3* 3.7 4.0  -Replete with po Kcl 40 mEQ BID  -Continue to Monitor and Replete as Necessary -Repeat CMP in the AM   GERD without esophagitis -Continue with PPI with Pantoprazole 40 mg po Daily  -Lifestyle changes discussed with patient.   History of DVT (deep vein thrombosis) -Continue anticoagulation with apixaban.  -No signs of overt bleeding. -Reports no lower extremity pain currently.   Iron deficiency anemia/Microcytic Anemia -Hgb has been stable.  -Hgb/Hct Trend: Recent Labs  Lab 10/24/22 0925 10/31/22 0501 11/07/22 0928 11/08/22 0801 11/09/22 0920 11/10/22 0420 11/11/22 0350  HGB 9.5* 10.5* 11.8* 11.7* 10.9* 11.0* 10.9*  HCT 37.6* 40.2 43.6 43.1 41.1 41.4 41.4  MCV 82.8 80.4 77.4* 79.5* 80.7 82.5 82.5  -Checked Anemia Panel and showed an iron level of 41, UIBC of 357, TIBC of 398, saturation ratios of 10%, ferritin level of 30, folate level 7.2, vitamin B12 571 -Continue to Monitor for S/Sx of Bleeding; No signs of overt bleeding appreciated. -Repeat CBC in the AM   Hyperlipidemia -Does not appear to be on statin therapy per [facility] -Heart healthy diet discussed with patient.   Chronic Colitis -Continue treatment with Mesalamine.  -WBC Trend: Recent Labs  Lab 10/24/22 0925 10/31/22 0501 11/07/22 0928 11/08/22 0801 11/09/22 0920 11/10/22 0420 11/11/22 0350  WBC 3.8* 7.4 10.6* 10.9* 11.0* 11.2* 12.7*  -No signs of Acute Exacerbation.  -Patient reports no nausea, no vomiting and no abdominal pain at the moment. -Tolerating diet without problems.   AKI (acute kidney injury) ([name]), worsening  -Continue to closely follow renal function trend -Creatinine fluctuating with a creatinine in the 1.3-1.4 at the moment; patient renal function has been as high as 1.7--1.8 in the past. -BUN/Cr Trend: Recent Labs  Lab 11/05/22 0850 11/07/22 0928 11/07/22 1524 11/08/22 0801 11/09/22 0920 11/10/22 0420 11/11/22 0350  BUN 52*  72* 76* 83* 67* 60* 55*  CREATININE 1.87* 2.17* 2.02* 2.07* 1.67* 1.57* 1.43*  -Avoid Nephrotoxic Medications, Contrast Dyes, Hypotension and Dehydration to Ensure Adequate Renal Perfusion and will need to Renally Adjust Meds -Continue to Follow Strict I's and O's and Monitor Urine Output -Continue to Monitor and Trend Renal Function carefully and repeat CMP in the AM  -Nephrology Consulted for further evaluation and recommendations patient underwent full workup and getting a urinalysis, urine sodium, renal ultrasound and recommending obtaining cardiology consultation given his issues with fluid retention;  -Cardiology has recommended stopping the SGLT2 inhibitor again given the high risk for fungal infections and morbid obesity and do not feel that he is overtly volume overloaded and may be on the dry side.  They do not feel that he has cardiorenal syndrome at this time and recommending continue to hold diuretics for now given that renal function and fluid status normalized. -Cardiology recommended if she continues have worsening renal function and continued decline with his blood pressures dropping and they are considering a PICC line to measure CVP given that the echo is not really consistent with significant volume overload and they do not feel that he needs a right heart cath warranted at this time -Nephrology now signed off the case and recommending diuresis per Cardiology and Cardiology also has signed off but his diuretics have been restarted at 40 mg po BID of Torsemide  Leukocytosis  -Mild and Likely Reactive and now worsening for unclear reasons -WBC Trend: Recent Labs  Lab 10/24/22 0925 10/31/22 0501 11/07/22 0928 11/08/22 0801 11/09/22 0920 11/10/22 0420 11/11/22 0350  WBC 3.8* 7.4 10.6* 10.9* 11.0* 11.2* 12.7*  -Continue to Monitor and Trend and repeat CBC in the AM  -If worsens further will Pan-Culture the patient   Hypoalbuminemia -Patient's Albumin Trend: Recent Labs   Lab 11/05/22 0850 11/07/22 0928 11/07/22 1524 11/08/22 0801 11/09/22 0920 11/10/22 0420 11/11/22 0350  ALBUMIN 3.5 3.5 3.5 3.4* 3.0* 3.2* 3.0*  -Continue to Monitor and Trend and repeat CMP in the AM  Super Morbid Obesity with BMI of 50.0-59.9, adult ([name]) -Complicates overall prognosis and care -Estimated body mass index is 52.92 kg/m as calculated from the following:   Height as of this encounter: 6\' 4"  (1.93 m).   Weight as of this encounter: 197.2 kg.  -Weight Loss and Dietary Counseling given and Low-calorie diet and portion control discussed with patient.  Active Pressure Injury/Wound(s)     Pressure Ulcer  Duration          Pressure Injury 09/27/22 Buttocks Left;Medial;Upper Stage 2 -  Partial thickness loss of dermis presenting as a shallow open injury with a red, pink wound bed without slough. Left buttocks small stage 2 46 days           Consultants: Palliative Care Medicine, Cardiology, Nephrology Procedures performed: As delineated as above  Disposition:  [facility] Diet recommendation:  Cardiac diet DISCHARGE MEDICATION: Allergies as of 11/12/2022   No Known Allergies      Medication List     STOP taking these medications    furosemide 80 MG tablet Commonly known as: Lasix       TAKE these medications    acetaminophen 325 MG tablet Commonly known as: TYLENOL Take 2 tablets (650 mg total) by mouth every 6 (six) hours as needed for mild pain (or Fever >/= 101).   apixaban 2.5 MG Tabs tablet Commonly known as: ELIQUIS Take 1 tablet (2.5 mg total) by mouth 2 (two) times daily.   arformoterol 15 MCG/2ML Nebu Commonly known as: BROVANA Take 2 mLs (15 mcg total) by nebulization 2 (two) times daily.   budesonide 0.5 MG/2ML nebulizer solution Commonly known as: PULMICORT Take 2 mLs (0.5 mg total) by nebulization 2 (two) times daily.   docusate sodium 100 MG capsule Commonly known as: COLACE Take 1 capsule (100 mg total) by mouth daily.    ferrous sulfate 325 (65 FE) MG tablet Take 1 tablet (325 mg total) by mouth daily with breakfast. Home med   Gerhardt's butt cream Crea Apply 1 Application topically 2 (two) times daily.   guaiFENesin 600 MG 12 hr tablet Commonly known as: MUCINEX Take 2 tablets (1,200 mg total) by mouth 2 (two) times daily.   ipratropium-albuterol 0.5-2.5 (3) MG/3ML Soln Commonly known as: DUONEB Take 3 mLs by nebulization every 6 (six) hours as needed.   melatonin 3 MG Tabs tablet Take 1 tablet (3 mg total) by mouth at bedtime.   mesalamine 1.2 g EC tablet Commonly known as: LIALDA Take 2 tablets (2.4 g total) by mouth 2 (two) times daily.   midodrine 10 MG tablet Commonly known as: PROAMATINE Take 1 tablet (10 mg total) by mouth 2 (two) times daily with a meal.   mouth rinse Liqd solution 15 mLs by Mouth Rinse route 4 (four) times daily.   pantoprazole 40 MG tablet Commonly known as: PROTONIX Take 1 tablet (  40 mg total) by mouth at bedtime.   polyethylene glycol 17 g packet Commonly known as: MIRALAX / GLYCOLAX Take 17 g by mouth daily as needed for moderate constipation.   potassium chloride SA 20 MEQ tablet Commonly known as: KLOR-CON M Take 3 tablets (60 mEq total) by mouth 2 (two) times daily.   predniSONE 10 MG tablet Commonly known as: DELTASONE Take 1 tablet (10 mg total) by mouth daily with breakfast.   revefenacin 175 MCG/3ML nebulizer solution Commonly known as: YUPELRI Take 3 mLs (175 mcg total) by nebulization daily. Start taking on: November 13, 2022   Torsemide 40 MG Tabs Take 40 mg by mouth 2 (two) times daily.   traMADol 50 MG tablet Commonly known as: ULTRAM Take 1 tablet (50 mg total) by mouth every 8 (eight) hours as needed for severe pain.               Discharge Care Instructions  (From admission, onward)           Start     Ordered   11/12/22 0000  Discharge wound care:       Comments: Cleanse perineal skin twice daily and apply gerhardts  barrier paste to buttocks and posterior thighs near buttocks   11/12/22 1333            Follow-up Information     [facility] Follow up.   Why: [facility] to follow up regarding private duty at the home. Contact information: [address] [place] [address] [phone]         [facility]. Go to.   Contact information: [address] [place] [address] [phone]               Discharge Exam: Ceasar Mons Weights   11/10/22 0420 11/11/22 0404 11/12/22 0324  Weight: (!) 191.7 kg (!) 193.8 kg (!) 197.2 kg   Vitals:   11/12/22 0804 11/12/22 1200  BP: 115/73   Pulse: 83 84  Resp: 20 19  Temp: 98 F (36.7 C)   SpO2: 100% 95%   Examination: Physical Exam:  Constitutional: WN/WD super morbidly obese chronically ill-appearing African-American male in no acute distress Respiratory: Diminished to auscultation bilaterally with some coarse breath sounds and has some crackles but no appreciable wheezing, rales or rhonchi. Normal respiratory effort and patient is not tachypenic. No accessory muscle use.  He is wearing 11 L via trach collar Cardiovascular: RRR, no murmurs / rubs / gallops. S1 and S2 auscultated.  Has mild 1+ extremity edema Abdomen: Soft, non-tender, distended secondary to body habitus. Bowel sounds positive.  GU: Deferred. Musculoskeletal: No clubbing / cyanosis of digits/nails. No joint deformity upper and lower extremities  Skin: No rashes, lesions, ulcers on limited skin evaluation. No induration; Warm and dry.  Neurologic: CN 2-12 grossly intact with no focal deficits. Romberg sign and cerebellar reflexes not assessed.  Psychiatric: Normal judgment and insight. Alert and oriented x 3. Normal mood and appropriate affect.   Condition at discharge: stable  The results of significant diagnostics from this hospitalization (including imaging, microbiology, ancillary and  laboratory) are listed below for reference.   Imaging Studies: DG CHEST PORT 1 VIEW  Result Date: 11/12/2022 CLINICAL DATA:  Shortness of breath. EXAM: PORTABLE CHEST 1 VIEW COMPARISON:  11/11/22 FINDINGS: Tracheostomy tube tip is stable above the carina. Unchanged cardiomediastinal contours. Low lung volumes and pulmonary vascular congestion. Bilateral mid lung linear opacities are unchanged compatible  with platelike atelectasis. No new findings. IMPRESSION: Low lung volumes and pulmonary vascular congestion. Unchanged, bilateral midlung platelike atelectasis. Electronically Signed   By: Signa Kell M.D.   On: 11/12/2022 07:12   DG CHEST PORT 1 VIEW  Result Date: 11/11/2022 CLINICAL DATA:  [id number] SOB (shortness of breath) [id number] EXAM: PORTABLE CHEST 1 VIEW COMPARISON:  November 10, 2022 FINDINGS: The cardiomediastinal silhouette is unchanged and enlarged in contour.Tracheostomy. No pleural effusion. No pneumothorax. Patchy linear opacities, mildly increased in comparison to prior. Background of central venous congestion, similar in comparison to prior. IMPRESSION: Mildly increased patchy linear opacities, likely atelectasis. Electronically Signed   By: Meda Klinefelter M.D.   On: 11/11/2022 15:01   DG CHEST PORT 1 VIEW  Result Date: 11/10/2022 CLINICAL DATA:  Shortness of breath. EXAM: PORTABLE CHEST 1 VIEW COMPARISON:  November 09, 2022. FINDINGS: Stable cardiomegaly. Tracheostomy tube is in grossly good position. Mildly increased bilateral perihilar and basilar interstitial densities are noted concerning for atelectasis or possibly edema. Old left rib fractures are noted. IMPRESSION: Mildly increased bilateral lung opacities are noted concerning for atelectasis or possibly edema. Electronically Signed   By: Lupita Raider M.D.   On: 11/10/2022 12:25   DG CHEST PORT 1 VIEW  Result Date: 11/09/2022 CLINICAL DATA:  Shortness of breath EXAM: PORTABLE CHEST 1 VIEW COMPARISON:  Chest x-ray dated July 25th  2024 FINDINGS: Unchanged enlarged cardiac and mediastinal contours mild linear lower lung opacities, likely due to atelectasis. No evidence of pleural effusion or pneumothorax. IMPRESSION: 1. Mild linear lower lung opacities, likely due to atelectasis. 2. Cardiomegaly. Electronically Signed   By: Allegra Lai M.D.   On: 11/09/2022 10:05   ECHOCARDIOGRAM COMPLETE  Result Date: 11/08/2022    ECHOCARDIOGRAM REPORT   Patient Name:   GUNTHER BUSSIE Date of Exam: 11/08/2022 Medical Rec #:  [id number]      Height:       76.0 in Accession #:    [id number]     Weight:       418.0 lb Date of Birth:  1955-07-17      BSA:          3.030 m Patient Age:    67 years       BP:           94/73 mmHg Patient Gender: M              HR:           59 bpm. Exam Location:  Inpatient Procedure: 2D Echo, Cardiac Doppler and Color Doppler Indications:    R06.9 DOE  History:        Patient has prior history of Echocardiogram examinations, most                 recent 08/13/2022. COPD, Signs/Symptoms:Dyspnea and Shortness of                 Breath; Risk Factors:Hypertension. Trach collar.  Sonographer:    Sheralyn Boatman RDCS Referring Phys: [id number] [facility] LATIF [facility]  Sonographer Comments: Technically difficult study due to poor echo windows and patient is obese. Image acquisition challenging due to patient body habitus and Image acquisition challenging due to uncooperative patient. Patient was rude and talking on the  phone during exam. Patient would not allow sufficient probe pressure in apical region. Patient stated he did not have IV access. Patient stated he had a visitor coming and would not allow echo if visitor came in. IMPRESSIONS  1. Left ventricular ejection fraction, by estimation, is 55 to 60%. The left ventricle has normal function. The left ventricle has no regional wall motion abnormalities. There is moderate concentric left ventricular hypertrophy. Left ventricular diastolic parameters are consistent with Grade I diastolic  dysfunction (impaired relaxation).  2. Right ventricular systolic function is normal. The right ventricular size is normal.  3. The mitral valve is normal in structure. No evidence of mitral valve regurgitation. No evidence of mitral stenosis.  4. The aortic valve is normal in structure. Aortic valve regurgitation is not visualized. No aortic stenosis is present.  5. Aortic dilatation noted. There is borderline dilatation of the ascending aorta, measuring 38 mm.  6. The inferior vena cava is normal in size with greater than 50% respiratory variability, suggesting right atrial pressure of 3 mmHg.  7. Technically limited study due to poor sound wave transmission. FINDINGS  Left Ventricle: Left ventricular ejection fraction, by estimation, is 55 to 60%. The left ventricle has normal function. The left ventricle has no regional wall motion abnormalities. The left ventricular internal cavity size was normal in size. There is  moderate concentric left ventricular hypertrophy. Left ventricular diastolic parameters are consistent with Grade I diastolic dysfunction (impaired relaxation). Right Ventricle: The right ventricular size is normal. No increase in right ventricular wall thickness. Right ventricular systolic function is normal. Left Atrium: Left atrial size was normal in size. Right Atrium: Right atrial size was normal in size. Pericardium: There is no evidence of pericardial effusion. Mitral Valve: The mitral valve is normal in structure. No evidence of mitral valve regurgitation. No evidence of mitral valve stenosis. Tricuspid Valve: The tricuspid valve is normal in structure. Tricuspid valve regurgitation is trivial. No evidence of tricuspid stenosis. Aortic Valve: The aortic valve is normal in structure. Aortic valve regurgitation is not visualized. No aortic stenosis is present. Pulmonic Valve: The pulmonic valve was not well visualized. Pulmonic valve regurgitation is not visualized. No evidence of pulmonic  stenosis. Aorta: Aortic dilatation noted. There is borderline dilatation of the ascending aorta, measuring 38 mm. Venous: The inferior vena cava is normal in size with greater than 50% respiratory variability, suggesting right atrial pressure of 3 mmHg. IAS/Shunts: No atrial level shunt detected by color flow Doppler.  LEFT VENTRICLE PLAX 2D LVIDd:         5.00 cm     Diastology LVIDs:         3.70 cm     LV e' medial:    3.26 cm/s LV PW:         1.30 cm     LV E/e' medial:  10.8 LV IVS:        1.50 cm     LV e' lateral:   8.16 cm/s LVOT diam:     2.50 cm     LV E/e' lateral: 4.3 LV SV:         71 LV SV Index:   23 LVOT Area:     4.91 cm  LV Volumes (MOD) LV vol d, MOD A2C: 98.7 ml LV vol d, MOD A4C: 96.4 ml LV vol s, MOD A2C: 40.8 ml LV vol s, MOD A4C: 41.8 ml LV SV MOD A2C:     57.9 ml LV SV MOD A4C:     96.4 ml LV SV MOD BP:      58.6 ml RIGHT VENTRICLE             IVC RV S prime:     13.50  cm/s  IVC diam: 1.70 cm TAPSE (M-mode): 2.1 cm LEFT ATRIUM             Index        RIGHT ATRIUM           Index LA diam:        2.70 cm 0.89 cm/m   RA Area:     16.10 cm LA Vol (A2C):   32.4 ml 10.69 ml/m  RA Volume:   40.20 ml  13.27 ml/m LA Vol (A4C):   56.2 ml 18.55 ml/m LA Biplane Vol: 45.7 ml 15.08 ml/m  AORTIC VALVE LVOT Vmax:   88.30 cm/s LVOT Vmean:  55.900 cm/s LVOT VTI:    0.145 m  AORTA Ao Root diam: 4.10 cm Ao Asc diam:  3.80 cm MITRAL VALVE MV Area (PHT): 2.76 cm    SHUNTS MV Decel Time: 275 msec    Systemic VTI:  0.14 m MV E velocity: 35.10 cm/s  Systemic Diam: 2.50 cm MV A velocity: 66.00 cm/s MV E/A ratio:  0.53 Arvilla Meres MD Electronically signed by Arvilla Meres MD Signature Date/Time: 11/08/2022/3:05:16 PM    Final    DG CHEST PORT 1 VIEW  Result Date: 11/08/2022 CLINICAL DATA:  Shortness of breath EXAM: PORTABLE CHEST 1 VIEW COMPARISON:  CXR 11/07/22 FINDINGS: Tracheostomy in place. No pleural effusion. No pneumothorax. Cardiomegaly. Low lung volumes. Mild pulmonary edema. Linear  opacities in the bilateral mid lung fields likely represent atelectasis. No radiographically apparent displaced rib fractures. Visualized upper abdomen unremarkable. IMPRESSION: Cardiomegaly with mild pulmonary edema.  No pleural effusion. Electronically Signed   By: Lorenza Cambridge M.D.   On: 11/08/2022 07:56   US RENAL  Result Date: 11/08/2022 CLINICAL DATA:  [id number].  Acute kidney injury. EXAM: RENAL / URINARY TRACT ULTRASOUND COMPLETE COMPARISON:  CTA abdomen and pelvis GI bleed exam 09/07/2022. FINDINGS: Technical note: Limited image detail due to patient body habitus and large size attenuating the sound beam. Right Kidney: Renal measurements: 10.2 x 5.1 x 4.4 cm = volume: 120.08 mL. Echogenicity within normal limits as well as can be seen. No obvious mass, stones or hydronephrosis visualized. Left Kidney: Renal measurements: 10.5 x 5.7 x 4.1 cm = volume: 128.99 mL. Echogenicity within normal limits as well as can be seen. No mass, stones or hydronephrosis visualized. There is a 2.8 cm simple cyst in the upper pole, which was seen on the CT. Bladder: Appears normal for degree of bladder distention. Other: None. IMPRESSION: Limited ultrasound. No increased cortical echogenicity is suspected. No stones or hydronephrosis. 2.8 cm left renal cyst. Electronically Signed   By: Almira Bar M.D.   On: 11/08/2022 02:47   DG CHEST PORT 1 VIEW  Result Date: 11/07/2022 CLINICAL DATA:  Shortness of breath EXAM: PORTABLE CHEST 1 VIEW COMPARISON:  11/03/2022 FINDINGS: Tracheostomy in place. Chronic cardiomegaly and aortic tortuosity. Pulmonary venous hypertension, possibly with early interstitial edema. No consolidation or collapse. No definite pleural effusion. IMPRESSION: Cardiomegaly and aortic tortuosity. Pulmonary venous hypertension, possibly with early interstitial edema. Electronically Signed   By: Paulina Fusi M.D.   On: 11/07/2022 14:09   DG CHEST PORT 1 VIEW  Result Date: 11/03/2022 CLINICAL DATA:   Shortness of breath EXAM: PORTABLE CHEST 1 VIEW COMPARISON:  10/16/2022 FINDINGS: Cardiac shadow is enlarged but stable. Tracheostomy tube is again seen. Lungs are well aerated with minimal thickening of the minor fissure on the right. No focal confluent infiltrate is seen. No bony abnormality is noted. IMPRESSION: No  acute abnormality seen. Electronically Signed   By: Alcide Clever M.D.   On: 11/03/2022 17:05   DG Chest Port 1 View  Result Date: 10/16/2022 CLINICAL DATA:  Chest pain. EXAM: PORTABLE CHEST 1 VIEW COMPARISON:  09/29/2022. FINDINGS: Heart is enlarged and the mediastinal contour is within normal limits. Lung volumes are low. No consolidation, effusion, or pneumothorax. No acute osseous abnormality. An endotracheal tube terminates 5.3 cm above the carina. IMPRESSION: 1. No active disease. 2. Cardiomegaly. Electronically Signed   By: Thornell Sartorius M.D.   On: 10/16/2022 22:53    Microbiology: Results for orders placed or performed during the hospital encounter of 09/26/22  SARS Coronavirus 2 by RT PCR (hospital order, performed in [facility] hospital lab) *cepheid single result test* Anterior Nasal Swab     Status: None   Collection Time: 09/27/22 12:39 AM   Specimen: Anterior Nasal Swab  Result Value Ref Range Status   SARS Coronavirus 2 by RT PCR NEGATIVE NEGATIVE Final    Comment: (NOTE) SARS-CoV-2 target nucleic acids are NOT DETECTED.  The SARS-CoV-2 RNA is generally detectable in upper and lower respiratory specimens during the acute phase of infection. The lowest concentration of SARS-CoV-2 viral copies this assay can detect is 250 copies / mL. A negative result does not preclude SARS-CoV-2 infection and should not be used as the sole basis for treatment or other patient management decisions.  A negative result may occur with improper specimen collection / handling, submission of specimen other than nasopharyngeal swab, presence of viral mutation(s) within the areas targeted  by this assay, and inadequate number of viral copies (<250 copies / mL). A negative result must be combined with clinical observations, patient history, and epidemiological information.  Fact Sheet for Patients:   RoadLapTop.co.za  Fact Sheet for Healthcare Providers: http://kim-miller.com/  This test is not yet approved or  cleared by the [place] FDA and has been authorized for detection and/or diagnosis of SARS-CoV-2 by FDA under an Emergency Use Authorization (EUA).  This EUA will remain in effect (meaning this test can be used) for the duration of the COVID-19 declaration under Section 564(b)(1) of the Act, 21 U.S.C. section 360bbb-3(b)(1), unless the authorization is terminated or revoked sooner.  Performed at [facility], [address] [place], [place] [address]   Culture, blood (Routine X 2) w Reflex to ID Panel     Status: None   Collection Time: 09/27/22 12:39 AM   Specimen: BLOOD  Result Value Ref Range Status   Specimen Description   Final    BLOOD  RIGHT ARM Performed at [facility], [address] [place], [place] [address]    Special Requests   Final    BOTTLES DRAWN AEROBIC AND ANAEROBIC Blood Culture adequate volume Performed at [facility], [address], [place], [place] [address]    Culture  Setup Time   Final    GRAM POSITIVE RODS AEROBIC BOTTLE ONLY CRITICAL RESULT CALLED TO, READ BACK BY AND VERIFIED WITH: NATHAN BELUE AT 9147 09/29/22.PMF Performed at [facility], [address], [place], [place] [address]    Culture   Final    Romie Minus POSITIVE RODS Sammuel Hines FOR ID SEE SEPARATE REPORT Performed at [facility], [address] [place], [place] [address]    Report Status 10/12/2022 FINAL  Final  Culture, blood (Routine X 2) w Reflex to ID Panel     Status: None   Collection Time: 09/27/22 12:39 AM  Specimen: BLOOD  Result Value Ref  Range Status   Specimen Description BLOOD  RIGHT HAND  Final   Special Requests   Final    BOTTLES DRAWN AEROBIC AND ANAEROBIC Blood Culture adequate volume   Culture   Final    NO GROWTH 5 DAYS Performed at [facility], [address] [place], [place] [address]    Report Status 10/02/2022 FINAL  Final  Organism ID, Bacteria     Status: Abnormal (Preliminary result)   Collection Time: 09/27/22 12:39 AM  Result Value Ref Range Status   Organism ID, Bacteria Final report (A)  Final    Comment: (NOTE) Performed At: [facility] [address] [place], [place] [id number] Jolene Schimke MD XB:[id number]    Source of Sample PENDING  Incomplete  Bacterial organism reflex     Status: Abnormal   Collection Time: 09/27/22 12:39 AM  Result Value Ref Range Status   Bacterial result 1 Final report (A)  Final    Comment: (NOTE) CORYNEBACTERIUM AFERMENTAS SSP LIPOPHIUM Performed At: [facility]  [address] [place], [place] [id number] Jolene Schimke MD [id number]   MRSA Next Gen by PCR, Nasal     Status: None   Collection Time: 09/27/22  1:45 PM   Specimen: Nasal Mucosa; Nasal Swab  Result Value Ref Range Status   MRSA by PCR Next Gen NOT DETECTED NOT DETECTED Final    Comment: (NOTE) The GeneXpert MRSA Assay (FDA approved for NASAL specimens only), is one component of a comprehensive MRSA colonization surveillance program. It is not intended to diagnose MRSA infection nor to guide or monitor treatment for MRSA infections. Test performance is not FDA approved in patients less than 4 years old. Performed at [facility], [address] [place], [place] [address]   Respiratory (~20 pathogens) panel by PCR     Status: None   Collection Time: 09/27/22  6:18 PM   Specimen: Nasopharyngeal Swab  Result Value Ref Range Status   Adenovirus NOT DETECTED NOT DETECTED Final   Coronavirus 229E NOT DETECTED NOT DETECTED Final    Comment: (NOTE) The  Coronavirus on the Respiratory Panel, DOES NOT test for the novel  Coronavirus (2019 nCoV)    Coronavirus HKU1 NOT DETECTED NOT DETECTED Final   Coronavirus NL63 NOT DETECTED NOT DETECTED Final   Coronavirus OC43 NOT DETECTED NOT DETECTED Final   Metapneumovirus NOT DETECTED NOT DETECTED Final   Rhinovirus / Enterovirus NOT DETECTED NOT DETECTED Final   Influenza A NOT DETECTED NOT DETECTED Final   Influenza B NOT DETECTED NOT DETECTED Final   Parainfluenza Virus 1 NOT DETECTED NOT DETECTED Final   Parainfluenza Virus 2 NOT DETECTED NOT DETECTED Final   Parainfluenza Virus 3 NOT DETECTED NOT DETECTED Final   Parainfluenza Virus 4 NOT DETECTED NOT DETECTED Final   Respiratory Syncytial Virus NOT DETECTED NOT DETECTED Final   Bordetella pertussis NOT DETECTED NOT DETECTED Final   Bordetella Parapertussis NOT DETECTED NOT DETECTED Final   Chlamydophila pneumoniae NOT DETECTED NOT DETECTED Final   Mycoplasma pneumoniae NOT DETECTED NOT DETECTED Final    Comment: Performed at [facility], [address], [place], [place] [address]  Culture, BAL-quantitative w Gram Stain     Status: Abnormal   Collection Time: 09/28/22  4:36 PM   Specimen: Bronchoalveolar Lavage; Respiratory  Result Value Ref Range Status   Specimen Description   Final    BRONCHIAL ALVEOLAR LAVAGE Performed at [facility], [address]  [place], [place] [address]    Special Requests   Final    NONE Performed at [facility], [address] [place], [place] [address]    Gram Stain   Final    MODERATE WBC PRESENT, PREDOMINANTLY PMN MODERATE GRAM NEGATIVE RODS RARE GRAM POSITIVE RODS Performed at [facility], [address] [place], [place] [address]    Culture >=100,000 COLONIES/mL [name] (A)  Final   Report Status 09/30/2022 FINAL  Final   Organism ID, Bacteria [name] (A)  Final      Susceptibility   [name] - MIC*    CEFTAZIDIME  >=64 RESISTANT Resistant     CIPROFLOXACIN <=0.25 SENSITIVE Sensitive     GENTAMICIN <=1 SENSITIVE Sensitive     IMIPENEM 1 SENSITIVE Sensitive     PIP/TAZO >=128 RESISTANT Resistant     * >=100,000 COLONIES/mL [name]  Acid Fast Smear (AFB)     Status: None   Collection Time: 09/28/22  4:36 PM   Specimen: Bronchoalveolar Lavage; Respiratory  Result Value Ref Range Status   AFB Specimen Processing Concentration  Final   Acid Fast Smear Negative  Final    Comment: (NOTE) Performed At: [facility] [address] [place], [place] [id number] Jolene Schimke MD OZ:[id number]    Source (AFB) BRONCHIAL ALVEOLAR LAVAGE  Final    Comment: Performed at [facility], [address] [place], [place] [address]  Culture, blood (single) w Reflex to ID Panel     Status: None   Collection Time: 10/02/22  2:37 PM   Specimen: BLOOD  Result Value Ref Range Status   Specimen Description BLOOD BLOOD RIGHT ARM RAC  Final   Special Requests   Final    BOTTLES DRAWN AEROBIC AND ANAEROBIC Blood Culture adequate volume   Culture   Final    NO GROWTH 5 DAYS Performed at [facility], [address] [place], [place] [address]    Report Status 10/07/2022 FINAL  Final   Labs: CBC: Recent Labs  Lab 11/07/22 0928 11/08/22 0801 11/09/22 0920 11/10/22 0420 11/11/22 0350  WBC 10.6* 10.9* 11.0* 11.2* 12.7*  NEUTROABS 7.7 8.1* 8.3* 7.8* 9.5*  HGB 11.8* 11.7* 10.9* 11.0* 10.9*  HCT 43.6 43.1 41.1 41.4 41.4  MCV 77.4* 79.5* 80.7 82.5 82.5  PLT 277 246 222 227 212   Basic Metabolic Panel: Recent Labs  Lab 11/07/22 0928 11/07/22 1524 11/08/22 0801 11/09/22 0920 11/10/22 0420 11/11/22 0350  NA 133* 134* 133* 135 140 135  K 2.5* 3.3* 2.9* 3.3* 3.7 4.0  CL 85* 86* 85* 92* 96* 94*  CO2 34* 35* 32 32 31 29  GLUCOSE 170* 164* 176* 158* 105* 135*  BUN 72* 76* 83* 67* 60* 55*  CREATININE 2.17* 2.02* 2.07* 1.67* 1.57* 1.43*  CALCIUM 8.0* 7.8* 8.2* 8.1* 8.5*  8.4*  MG 2.5*  --  2.6* 2.7* 2.4 1.9  PHOS 6.1* 5.2* 4.9* 3.3 4.0 3.6   Liver Function Tests: Recent Labs  Lab 11/07/22 0928 11/07/22 1524 11/08/22 0801 11/09/22 0920 11/10/22 0420 11/11/22 0350  AST 14*  --  14* 16 13* 12*  ALT 17  --  17 16 15 16   ALKPHOS 119  --  108 101 104 106  BILITOT 0.5  --  0.7 0.6 0.4 0.4  PROT 8.1  --  7.7 7.4 7.2 7.2  ALBUMIN 3.5 3.5 3.4* 3.0* 3.2* 3.0*   CBG: No results for input(s): "GLUCAP" in the last 168 hours.  Discharge time spent: greater than 30 minutes.  Signed: Marguerita Merles, DO [facility] 11/12/2022

## 2022-11-13 DIAGNOSIS — Z930 Tracheostomy status: Secondary | ICD-10-CM

## 2022-11-13 DIAGNOSIS — J449 Chronic obstructive pulmonary disease, unspecified: Secondary | ICD-10-CM

## 2022-11-13 DIAGNOSIS — I5032 Chronic diastolic (congestive) heart failure: Secondary | ICD-10-CM

## 2022-11-13 DIAGNOSIS — J9621 Acute and chronic respiratory failure with hypoxia: Secondary | ICD-10-CM

## 2022-11-14 DIAGNOSIS — Z930 Tracheostomy status: Secondary | ICD-10-CM

## 2022-11-14 DIAGNOSIS — I5032 Chronic diastolic (congestive) heart failure: Secondary | ICD-10-CM

## 2022-11-14 DIAGNOSIS — J449 Chronic obstructive pulmonary disease, unspecified: Secondary | ICD-10-CM

## 2022-11-14 DIAGNOSIS — J9621 Acute and chronic respiratory failure with hypoxia: Secondary | ICD-10-CM

## 2022-11-15 DIAGNOSIS — J9621 Acute and chronic respiratory failure with hypoxia: Secondary | ICD-10-CM | POA: Diagnosis not present

## 2022-11-15 DIAGNOSIS — J449 Chronic obstructive pulmonary disease, unspecified: Secondary | ICD-10-CM | POA: Diagnosis not present

## 2022-11-15 DIAGNOSIS — I5032 Chronic diastolic (congestive) heart failure: Secondary | ICD-10-CM | POA: Diagnosis not present

## 2022-11-15 DIAGNOSIS — J9610 Chronic respiratory failure, unspecified whether with hypoxia or hypercapnia: Secondary | ICD-10-CM | POA: Diagnosis not present

## 2022-11-15 DIAGNOSIS — Z930 Tracheostomy status: Secondary | ICD-10-CM | POA: Diagnosis not present

## 2022-11-16 DIAGNOSIS — J9610 Chronic respiratory failure, unspecified whether with hypoxia or hypercapnia: Secondary | ICD-10-CM | POA: Diagnosis not present

## 2022-11-16 DIAGNOSIS — Z930 Tracheostomy status: Secondary | ICD-10-CM | POA: Diagnosis not present

## 2022-11-16 DIAGNOSIS — I5032 Chronic diastolic (congestive) heart failure: Secondary | ICD-10-CM | POA: Diagnosis not present

## 2022-11-16 DIAGNOSIS — J9621 Acute and chronic respiratory failure with hypoxia: Secondary | ICD-10-CM | POA: Diagnosis not present

## 2022-11-16 DIAGNOSIS — J449 Chronic obstructive pulmonary disease, unspecified: Secondary | ICD-10-CM | POA: Diagnosis not present

## 2022-11-17 DIAGNOSIS — J449 Chronic obstructive pulmonary disease, unspecified: Secondary | ICD-10-CM | POA: Diagnosis not present

## 2022-11-17 DIAGNOSIS — J9610 Chronic respiratory failure, unspecified whether with hypoxia or hypercapnia: Secondary | ICD-10-CM | POA: Diagnosis not present

## 2022-11-17 DIAGNOSIS — Z930 Tracheostomy status: Secondary | ICD-10-CM | POA: Diagnosis not present

## 2022-11-17 DIAGNOSIS — I5032 Chronic diastolic (congestive) heart failure: Secondary | ICD-10-CM | POA: Diagnosis not present

## 2022-11-17 DIAGNOSIS — J9621 Acute and chronic respiratory failure with hypoxia: Secondary | ICD-10-CM | POA: Diagnosis not present

## 2022-11-17 LAB — BASIC METABOLIC PANEL
Anion gap: 10 (ref 5–15)
BUN: 34 mg/dL — ABNORMAL HIGH (ref 8–23)
CO2: 29 mmol/L (ref 22–32)
Calcium: 8.5 mg/dL — ABNORMAL LOW (ref 8.9–10.3)
Chloride: 100 mmol/L (ref 98–111)
Creatinine, Ser: 1.38 mg/dL — ABNORMAL HIGH (ref 0.61–1.24)
GFR, Estimated: 57 mL/min — ABNORMAL LOW (ref >60)
Glucose, Bld: 104 mg/dL — ABNORMAL HIGH (ref 70–99)
Potassium: 4.4 mmol/L (ref 3.5–5.1)
Sodium: 139 mmol/L (ref 135–145)

## 2022-11-17 LAB — CBC
HCT: 40.8 % (ref 39.0–52.0)
Hemoglobin: 10.7 g/dL — ABNORMAL LOW (ref 13.0–17.0)
MCH: 21.3 pg — ABNORMAL LOW (ref 26.0–34.0)
MCHC: 26.2 g/dL — ABNORMAL LOW (ref 30.0–36.0)
MCV: 81.1 fL (ref 80.0–100.0)
Platelets: 176 10*3/uL (ref 150–400)
RBC: 5.03 MIL/uL (ref 4.22–5.81)
RDW: 18.6 % — ABNORMAL HIGH (ref 11.5–15.5)
WBC: 10.1 10*3/uL (ref 4.0–10.5)
nRBC: 0 % (ref 0.0–0.2)

## 2022-11-17 LAB — MAGNESIUM: Magnesium: 2.2 mg/dL (ref 1.7–2.4)

## 2022-11-18 DIAGNOSIS — I5032 Chronic diastolic (congestive) heart failure: Secondary | ICD-10-CM | POA: Diagnosis not present

## 2022-11-18 DIAGNOSIS — Z930 Tracheostomy status: Secondary | ICD-10-CM | POA: Diagnosis not present

## 2022-11-18 DIAGNOSIS — J449 Chronic obstructive pulmonary disease, unspecified: Secondary | ICD-10-CM | POA: Diagnosis not present

## 2022-11-18 DIAGNOSIS — J9610 Chronic respiratory failure, unspecified whether with hypoxia or hypercapnia: Secondary | ICD-10-CM | POA: Diagnosis not present

## 2022-11-18 DIAGNOSIS — J9621 Acute and chronic respiratory failure with hypoxia: Secondary | ICD-10-CM | POA: Diagnosis not present

## 2022-11-19 DIAGNOSIS — J449 Chronic obstructive pulmonary disease, unspecified: Secondary | ICD-10-CM | POA: Diagnosis not present

## 2022-11-19 DIAGNOSIS — Z930 Tracheostomy status: Secondary | ICD-10-CM | POA: Diagnosis not present

## 2022-11-19 DIAGNOSIS — I5032 Chronic diastolic (congestive) heart failure: Secondary | ICD-10-CM | POA: Diagnosis not present

## 2022-11-19 DIAGNOSIS — J9621 Acute and chronic respiratory failure with hypoxia: Secondary | ICD-10-CM | POA: Diagnosis not present

## 2022-11-19 DIAGNOSIS — J9610 Chronic respiratory failure, unspecified whether with hypoxia or hypercapnia: Secondary | ICD-10-CM | POA: Diagnosis not present

## 2022-11-20 DIAGNOSIS — Z930 Tracheostomy status: Secondary | ICD-10-CM | POA: Diagnosis not present

## 2022-11-20 DIAGNOSIS — R6889 Other general symptoms and signs: Secondary | ICD-10-CM | POA: Diagnosis not present

## 2022-11-20 DIAGNOSIS — I5032 Chronic diastolic (congestive) heart failure: Secondary | ICD-10-CM | POA: Diagnosis not present

## 2022-11-20 DIAGNOSIS — J9690 Respiratory failure, unspecified, unspecified whether with hypoxia or hypercapnia: Secondary | ICD-10-CM | POA: Diagnosis not present

## 2022-11-20 DIAGNOSIS — J9610 Chronic respiratory failure, unspecified whether with hypoxia or hypercapnia: Secondary | ICD-10-CM | POA: Diagnosis not present

## 2022-11-20 DIAGNOSIS — Z743 Need for continuous supervision: Secondary | ICD-10-CM | POA: Diagnosis not present

## 2022-11-20 DIAGNOSIS — J449 Chronic obstructive pulmonary disease, unspecified: Secondary | ICD-10-CM | POA: Diagnosis not present

## 2022-11-20 DIAGNOSIS — R457 State of emotional shock and stress, unspecified: Secondary | ICD-10-CM | POA: Diagnosis not present

## 2022-11-20 DIAGNOSIS — J9621 Acute and chronic respiratory failure with hypoxia: Secondary | ICD-10-CM | POA: Diagnosis not present

## 2022-11-21 ENCOUNTER — Other Ambulatory Visit: Payer: Self-pay

## 2022-11-21 ENCOUNTER — Emergency Department
Admission: EM | Admit: 2022-11-21 | Discharge: 2022-11-21 | Disposition: A | Discharge status: Home / Self Care | Payer: 59 | Attending: Emergency Medicine | Admitting: Emergency Medicine

## 2022-11-21 DIAGNOSIS — R0602 Shortness of breath: Secondary | ICD-10-CM | POA: Diagnosis not present

## 2022-11-21 DIAGNOSIS — R051 Acute cough: Secondary | ICD-10-CM | POA: Diagnosis not present

## 2022-11-21 DIAGNOSIS — J9610 Chronic respiratory failure, unspecified whether with hypoxia or hypercapnia: Secondary | ICD-10-CM | POA: Diagnosis not present

## 2022-11-21 DIAGNOSIS — Z743 Need for continuous supervision: Secondary | ICD-10-CM | POA: Diagnosis not present

## 2022-11-21 DIAGNOSIS — R918 Other nonspecific abnormal finding of lung field: Secondary | ICD-10-CM | POA: Diagnosis not present

## 2022-11-21 DIAGNOSIS — N189 Chronic kidney disease, unspecified: Secondary | ICD-10-CM | POA: Diagnosis not present

## 2022-11-21 DIAGNOSIS — M7989 Other specified soft tissue disorders: Secondary | ICD-10-CM | POA: Diagnosis not present

## 2022-11-21 DIAGNOSIS — I110 Hypertensive heart disease with heart failure: Secondary | ICD-10-CM | POA: Diagnosis not present

## 2022-11-21 DIAGNOSIS — F419 Anxiety disorder, unspecified: Secondary | ICD-10-CM | POA: Diagnosis present

## 2022-11-21 DIAGNOSIS — J449 Chronic obstructive pulmonary disease, unspecified: Secondary | ICD-10-CM | POA: Insufficient documentation

## 2022-11-21 DIAGNOSIS — I5032 Chronic diastolic (congestive) heart failure: Secondary | ICD-10-CM | POA: Diagnosis not present

## 2022-11-21 DIAGNOSIS — I130 Hypertensive heart and chronic kidney disease with heart failure and stage 1 through stage 4 chronic kidney disease, or unspecified chronic kidney disease: Secondary | ICD-10-CM | POA: Insufficient documentation

## 2022-11-21 DIAGNOSIS — R079 Chest pain, unspecified: Secondary | ICD-10-CM | POA: Diagnosis not present

## 2022-11-21 DIAGNOSIS — J9811 Atelectasis: Secondary | ICD-10-CM | POA: Diagnosis not present

## 2022-11-21 DIAGNOSIS — E785 Hyperlipidemia, unspecified: Secondary | ICD-10-CM | POA: Diagnosis not present

## 2022-11-21 DIAGNOSIS — I5030 Unspecified diastolic (congestive) heart failure: Secondary | ICD-10-CM | POA: Insufficient documentation

## 2022-11-21 DIAGNOSIS — J811 Chronic pulmonary edema: Secondary | ICD-10-CM | POA: Diagnosis not present

## 2022-11-21 DIAGNOSIS — E119 Type 2 diabetes mellitus without complications: Secondary | ICD-10-CM | POA: Diagnosis not present

## 2022-11-21 DIAGNOSIS — R531 Weakness: Secondary | ICD-10-CM | POA: Diagnosis not present

## 2022-11-21 DIAGNOSIS — R0989 Other specified symptoms and signs involving the circulatory and respiratory systems: Secondary | ICD-10-CM | POA: Diagnosis not present

## 2022-11-21 DIAGNOSIS — J9621 Acute and chronic respiratory failure with hypoxia: Secondary | ICD-10-CM | POA: Diagnosis not present

## 2022-11-21 NOTE — ED Triage Notes (Signed)
Pt BIB EMS because pt is unsure of how to use his humidifier at home and is requesting RT to see him. Pt has indwelling trach and is on 3-8L O2.

## 2022-11-21 NOTE — ED Notes (Signed)
PT left with EMS at this time after talking with EMS supervisor and crew. Pt agreeable to take a few steps to get inside the house.

## 2022-11-21 NOTE — ED Notes (Signed)
[facility] here at this time to transport pt back home. Pt states he isn't going with them because they won't take him back to his bedroom, EMS states there is no logistical way to get him into his bedroom. Pt then sat back on bed after standing to transfer to stretcher. Ems contacted again and supervisor to come out with crew.

## 2022-11-21 NOTE — ED Notes (Incomplete)
Writer called to pts room where [employer] supervisor and medics due to pt refusing to be transported unless taken inside his home and to his bedroom by stretcher. Medics and Merchandiser, retail

## 2022-11-21 NOTE — ED Provider Notes (Signed)
[facility] Provider Note    Event Date/Time   First MD Initiated Contact with Patient 11/21/22 [phone]     (approximate)   History   No chief complaint on file.   HPI  Gary [name] is a 67 y.o. male   Past medical history of OSA, OHS, obesity, hypertension, hyperlipidemia, GERD, substance use, depression and anxiety, anemia, COPD, chronic respiratory failure with hypoxia and trach, CKD, diastolic heart failure who presents to the emergency department today with a chief complaint that he does not know how to use his home humidifier.  EMS reports that he got an argument with his girlfriend and his girlfriend threatened to leave and he had a panic attack.  Gary [name] today denies any increase in shortness of breath, chest pain, sputum production, cough, fever or chills.  He is concerned because he wants to be able to use his humidifier at home and does not have home health nursing to his house until Thursday.  He is requesting respiratory come to help him with suctioning.   External Medical Documents Reviewed: Discharge summary dated 11/12/2022 for acute on chronic diastolic heart failure, underwent diuresis with decreasing oxygen requirements      Physical Exam   Triage Vital Signs: ED Triage Vitals  Encounter Vitals Group     BP      Systolic BP Percentile      Diastolic BP Percentile      Pulse      Resp      Temp      Temp src      SpO2      Weight      Height      Head Circumference      Peak Flow      Pain Score      Pain Loc      Pain Education      Exclude from Growth Chart     Most recent vital signs: There were no vitals filed for this visit.  General: Awake, no distress.  CV:  Good peripheral perfusion.  Resp:  Normal effort.  Abd:  No distention.  Other:  Awake comfortable speaking in full sentences, with clear lungs without focality or rales.  Trach collar in place.   ED Results / Procedures / Treatments   Labs (all  labs ordered are listed, but only abnormal results are displayed) Labs Reviewed - No data to display   PROCEDURES:  Critical Care performed: No  Procedures   MEDICATIONS ORDERED IN ED: Medications - No data to display  IMPRESSION / MDM / ASSESSMENT AND PLAN / ED COURSE  I reviewed the triage vital signs and the nursing notes.                                Patient's presentation is most consistent with severe exacerbation of chronic illness.  Differential diagnosis includes, but is not limited to, chronic respiratory failure, social problem, equipment problem, considered but less likely acute respiratory failure due to pneumonia, fluid overload, ACS, PE   The patient is on the cardiac monitor to evaluate for evidence of arrhythmia and/or significant heart rate changes.  MDM:   Patient with no acute medical complaints today except for problems with home equipment humidifier.  He is requesting respiratory come to help him with suctioning, I will have them advised on how to set up his humidifier at home which  she states that he has at his home and is "brand-new" he does not know how to set it up.  Although he does have chronic medical issues and recent hospitalization, today he denies any acute medical complaints, looks well, checked to make sure he is on his home O2 requirements without hypoxia, and will have respiratory therapy address his home equipment questions.       FINAL CLINICAL IMPRESSION(S) / ED DIAGNOSES   Final diagnoses:  Chronic respiratory failure, unspecified whether with hypoxia or hypercapnia ([name])     Rx / DC Orders   ED Discharge Orders     None        Note:  This document was prepared using Dragon voice recognition software and may include unintentional dictation errors.    Pilar Jarvis, MD 11/21/22 [phone]

## 2022-11-21 NOTE — Progress Notes (Signed)
Pt was suctioned at his request. Airway is patent and pt has strong cough. Scant, thin, clear secretions noted. Pt also suctioned himself multiple times and no secretions noted. Suction equipment left ready at pt bedside at his request, along with extra sx kits. Pt w/o distress, normal respirations, and good sp02 noted on monitor at bedside. Pt asked therapist about home humidifier equipment and was advised he needs to contact home care equipment company for best assistance. Pt asked if he knows home care company name and contact info and pt stated "Yes. I didn't think about that, to call them." Nurse notified patient was assisted and found without any further current issues/ needs for respiratory therapy at this time.

## 2022-11-21 NOTE — ED Notes (Signed)
Writer called to room due to EMS supervisor and medics in room with pt who refuses to transport if they will not use stretcher to take him to his bed. Pt known to use walker and used walker to front door and assisted onto stretcher to come to ED tonight. EMS supervisor Clydie Braun, Clinical research associate and pt able to come to agreement of transport. Pt verbalizes and agrees to go back to home with medics rolling pt on stretcher to door and assisting pt to stand and use walker. Pts significant other on phone, Kennith Center, who is also agreeable and ensures that pt has been able to use walker to and from distance asked. Tracey instructed to have outside light on for EMS arrival and walker within close proximity of door for EMS assist.

## 2022-11-22 DIAGNOSIS — R0602 Shortness of breath: Secondary | ICD-10-CM | POA: Diagnosis not present

## 2022-11-25 DIAGNOSIS — J9811 Atelectasis: Secondary | ICD-10-CM | POA: Diagnosis not present

## 2022-11-25 DIAGNOSIS — R0602 Shortness of breath: Secondary | ICD-10-CM | POA: Diagnosis not present

## 2022-11-25 DIAGNOSIS — R918 Other nonspecific abnormal finding of lung field: Secondary | ICD-10-CM | POA: Diagnosis not present

## 2022-11-25 DIAGNOSIS — J811 Chronic pulmonary edema: Secondary | ICD-10-CM | POA: Diagnosis not present

## 2022-11-26 DIAGNOSIS — J811 Chronic pulmonary edema: Secondary | ICD-10-CM | POA: Diagnosis not present

## 2022-11-26 DIAGNOSIS — R918 Other nonspecific abnormal finding of lung field: Secondary | ICD-10-CM | POA: Diagnosis not present

## 2022-11-27 DIAGNOSIS — M6250 Muscle wasting and atrophy, not elsewhere classified, unspecified site: Secondary | ICD-10-CM | POA: Diagnosis not present

## 2022-11-27 DIAGNOSIS — R0602 Shortness of breath: Secondary | ICD-10-CM | POA: Diagnosis not present

## 2022-11-27 DIAGNOSIS — R531 Weakness: Secondary | ICD-10-CM | POA: Diagnosis not present

## 2022-11-28 ENCOUNTER — Emergency Department: Payer: Medicare HMO

## 2022-11-28 ENCOUNTER — Inpatient Hospital Stay
Admission: EM | Admit: 2022-11-28 | Discharge: 2023-04-23 | DRG: 291 | Disposition: A | Discharge status: Home Health | Payer: Medicare HMO | Attending: Internal Medicine | Admitting: Internal Medicine

## 2022-11-28 ENCOUNTER — Other Ambulatory Visit: Payer: Self-pay

## 2022-11-28 DIAGNOSIS — I5033 Acute on chronic diastolic (congestive) heart failure: Secondary | ICD-10-CM | POA: Diagnosis present

## 2022-11-28 DIAGNOSIS — Z5986 Financial insecurity: Secondary | ICD-10-CM

## 2022-11-28 DIAGNOSIS — D500 Iron deficiency anemia secondary to blood loss (chronic): Secondary | ICD-10-CM | POA: Diagnosis present

## 2022-11-28 DIAGNOSIS — J449 Chronic obstructive pulmonary disease, unspecified: Secondary | ICD-10-CM | POA: Diagnosis present

## 2022-11-28 DIAGNOSIS — Z1152 Encounter for screening for COVID-19: Secondary | ICD-10-CM | POA: Diagnosis not present

## 2022-11-28 DIAGNOSIS — Z7901 Long term (current) use of anticoagulants: Secondary | ICD-10-CM | POA: Diagnosis not present

## 2022-11-28 DIAGNOSIS — J9612 Chronic respiratory failure with hypercapnia: Secondary | ICD-10-CM | POA: Diagnosis not present

## 2022-11-28 DIAGNOSIS — R0789 Other chest pain: Secondary | ICD-10-CM | POA: Diagnosis not present

## 2022-11-28 DIAGNOSIS — L853 Xerosis cutis: Secondary | ICD-10-CM | POA: Diagnosis not present

## 2022-11-28 DIAGNOSIS — K5900 Constipation, unspecified: Secondary | ICD-10-CM | POA: Diagnosis not present

## 2022-11-28 DIAGNOSIS — D23111 Other benign neoplasm of skin of right upper eyelid, including canthus: Secondary | ICD-10-CM | POA: Diagnosis present

## 2022-11-28 DIAGNOSIS — Z8711 Personal history of peptic ulcer disease: Secondary | ICD-10-CM

## 2022-11-28 DIAGNOSIS — R0989 Other specified symptoms and signs involving the circulatory and respiratory systems: Secondary | ICD-10-CM | POA: Diagnosis not present

## 2022-11-28 DIAGNOSIS — I5032 Chronic diastolic (congestive) heart failure: Secondary | ICD-10-CM | POA: Diagnosis not present

## 2022-11-28 DIAGNOSIS — J9503 Malfunction of tracheostomy stoma: Secondary | ICD-10-CM | POA: Diagnosis not present

## 2022-11-28 DIAGNOSIS — Z6841 Body Mass Index (BMI) 40.0 and over, adult: Secondary | ICD-10-CM | POA: Diagnosis not present

## 2022-11-28 DIAGNOSIS — Z8616 Personal history of COVID-19: Secondary | ICD-10-CM | POA: Diagnosis not present

## 2022-11-28 DIAGNOSIS — J0410 Acute tracheitis without obstruction: Secondary | ICD-10-CM | POA: Diagnosis present

## 2022-11-28 DIAGNOSIS — K219 Gastro-esophageal reflux disease without esophagitis: Secondary | ICD-10-CM | POA: Diagnosis present

## 2022-11-28 DIAGNOSIS — K921 Melena: Secondary | ICD-10-CM | POA: Diagnosis present

## 2022-11-28 DIAGNOSIS — G4733 Obstructive sleep apnea (adult) (pediatric): Secondary | ICD-10-CM | POA: Diagnosis present

## 2022-11-28 DIAGNOSIS — J9622 Acute and chronic respiratory failure with hypercapnia: Secondary | ICD-10-CM | POA: Diagnosis present

## 2022-11-28 DIAGNOSIS — M47812 Spondylosis without myelopathy or radiculopathy, cervical region: Secondary | ICD-10-CM | POA: Diagnosis not present

## 2022-11-28 DIAGNOSIS — K529 Noninfective gastroenteritis and colitis, unspecified: Secondary | ICD-10-CM | POA: Diagnosis not present

## 2022-11-28 DIAGNOSIS — J9859 Other diseases of mediastinum, not elsewhere classified: Secondary | ICD-10-CM | POA: Diagnosis not present

## 2022-11-28 DIAGNOSIS — N189 Chronic kidney disease, unspecified: Secondary | ICD-10-CM | POA: Diagnosis not present

## 2022-11-28 DIAGNOSIS — N179 Acute kidney failure, unspecified: Secondary | ICD-10-CM | POA: Diagnosis present

## 2022-11-28 DIAGNOSIS — N1831 Chronic kidney disease, stage 3a: Secondary | ICD-10-CM | POA: Diagnosis present

## 2022-11-28 DIAGNOSIS — K449 Diaphragmatic hernia without obstruction or gangrene: Secondary | ICD-10-CM | POA: Diagnosis not present

## 2022-11-28 DIAGNOSIS — R918 Other nonspecific abnormal finding of lung field: Secondary | ICD-10-CM | POA: Diagnosis not present

## 2022-11-28 DIAGNOSIS — L89322 Pressure ulcer of left buttock, stage 2: Secondary | ICD-10-CM | POA: Diagnosis present

## 2022-11-28 DIAGNOSIS — Y848 Other medical procedures as the cause of abnormal reaction of the patient, or of later complication, without mention of misadventure at the time of the procedure: Secondary | ICD-10-CM | POA: Diagnosis not present

## 2022-11-28 DIAGNOSIS — J9611 Chronic respiratory failure with hypoxia: Secondary | ICD-10-CM

## 2022-11-28 DIAGNOSIS — F32A Depression, unspecified: Secondary | ICD-10-CM | POA: Diagnosis present

## 2022-11-28 DIAGNOSIS — R042 Hemoptysis: Secondary | ICD-10-CM | POA: Diagnosis present

## 2022-11-28 DIAGNOSIS — E8770 Fluid overload, unspecified: Secondary | ICD-10-CM | POA: Diagnosis present

## 2022-11-28 DIAGNOSIS — J811 Chronic pulmonary edema: Secondary | ICD-10-CM | POA: Diagnosis not present

## 2022-11-28 DIAGNOSIS — I509 Heart failure, unspecified: Secondary | ICD-10-CM | POA: Diagnosis not present

## 2022-11-28 DIAGNOSIS — Z5320 Procedure and treatment not carried out because of patient's decision for unspecified reasons: Secondary | ICD-10-CM | POA: Diagnosis present

## 2022-11-28 DIAGNOSIS — J9811 Atelectasis: Secondary | ICD-10-CM | POA: Diagnosis not present

## 2022-11-28 DIAGNOSIS — I6782 Cerebral ischemia: Secondary | ICD-10-CM | POA: Diagnosis not present

## 2022-11-28 DIAGNOSIS — J9602 Acute respiratory failure with hypercapnia: Secondary | ICD-10-CM | POA: Diagnosis not present

## 2022-11-28 DIAGNOSIS — J439 Emphysema, unspecified: Secondary | ICD-10-CM | POA: Diagnosis not present

## 2022-11-28 DIAGNOSIS — Z9911 Dependence on respirator [ventilator] status: Secondary | ICD-10-CM | POA: Diagnosis not present

## 2022-11-28 DIAGNOSIS — M5032 Other cervical disc degeneration, mid-cervical region, unspecified level: Secondary | ICD-10-CM | POA: Diagnosis not present

## 2022-11-28 DIAGNOSIS — Z743 Need for continuous supervision: Secondary | ICD-10-CM | POA: Diagnosis not present

## 2022-11-28 DIAGNOSIS — D509 Iron deficiency anemia, unspecified: Secondary | ICD-10-CM | POA: Diagnosis present

## 2022-11-28 DIAGNOSIS — R531 Weakness: Secondary | ICD-10-CM

## 2022-11-28 DIAGNOSIS — F419 Anxiety disorder, unspecified: Secondary | ICD-10-CM | POA: Diagnosis present

## 2022-11-28 DIAGNOSIS — M351 Other overlap syndromes: Secondary | ICD-10-CM | POA: Diagnosis present

## 2022-11-28 DIAGNOSIS — B965 Pseudomonas (aeruginosa) (mallei) (pseudomallei) as the cause of diseases classified elsewhere: Secondary | ICD-10-CM | POA: Diagnosis present

## 2022-11-28 DIAGNOSIS — Z87891 Personal history of nicotine dependence: Secondary | ICD-10-CM

## 2022-11-28 DIAGNOSIS — Z9981 Dependence on supplemental oxygen: Secondary | ICD-10-CM

## 2022-11-28 DIAGNOSIS — K2270 Barrett's esophagus without dysplasia: Secondary | ICD-10-CM | POA: Diagnosis present

## 2022-11-28 DIAGNOSIS — I130 Hypertensive heart and chronic kidney disease with heart failure and stage 1 through stage 4 chronic kidney disease, or unspecified chronic kidney disease: Secondary | ICD-10-CM | POA: Diagnosis present

## 2022-11-28 DIAGNOSIS — R079 Chest pain, unspecified: Secondary | ICD-10-CM | POA: Diagnosis present

## 2022-11-28 DIAGNOSIS — R933 Abnormal findings on diagnostic imaging of other parts of digestive tract: Secondary | ICD-10-CM | POA: Diagnosis not present

## 2022-11-28 DIAGNOSIS — Z79899 Other long term (current) drug therapy: Secondary | ICD-10-CM

## 2022-11-28 DIAGNOSIS — K2289 Other specified disease of esophagus: Secondary | ICD-10-CM | POA: Diagnosis not present

## 2022-11-28 DIAGNOSIS — I517 Cardiomegaly: Secondary | ICD-10-CM | POA: Diagnosis not present

## 2022-11-28 DIAGNOSIS — I672 Cerebral atherosclerosis: Secondary | ICD-10-CM | POA: Diagnosis not present

## 2022-11-28 DIAGNOSIS — I9589 Other hypotension: Secondary | ICD-10-CM | POA: Diagnosis not present

## 2022-11-28 DIAGNOSIS — J441 Chronic obstructive pulmonary disease with (acute) exacerbation: Secondary | ICD-10-CM | POA: Diagnosis not present

## 2022-11-28 DIAGNOSIS — Z91199 Patient's noncompliance with other medical treatment and regimen due to unspecified reason: Secondary | ICD-10-CM

## 2022-11-28 DIAGNOSIS — D62 Acute posthemorrhagic anemia: Secondary | ICD-10-CM | POA: Diagnosis present

## 2022-11-28 DIAGNOSIS — G4730 Sleep apnea, unspecified: Secondary | ICD-10-CM | POA: Diagnosis present

## 2022-11-28 DIAGNOSIS — I959 Hypotension, unspecified: Secondary | ICD-10-CM | POA: Diagnosis present

## 2022-11-28 DIAGNOSIS — Z7952 Long term (current) use of systemic steroids: Secondary | ICD-10-CM

## 2022-11-28 DIAGNOSIS — H02823 Cysts of right eye, unspecified eyelid: Secondary | ICD-10-CM | POA: Diagnosis not present

## 2022-11-28 DIAGNOSIS — I700 Atherosclerosis of aorta: Secondary | ICD-10-CM | POA: Diagnosis not present

## 2022-11-28 DIAGNOSIS — R0602 Shortness of breath: Secondary | ICD-10-CM | POA: Diagnosis not present

## 2022-11-28 DIAGNOSIS — J9601 Acute respiratory failure with hypoxia: Secondary | ICD-10-CM | POA: Diagnosis not present

## 2022-11-28 DIAGNOSIS — J9621 Acute and chronic respiratory failure with hypoxia: Principal | ICD-10-CM | POA: Diagnosis present

## 2022-11-28 DIAGNOSIS — Z430 Encounter for attention to tracheostomy: Secondary | ICD-10-CM | POA: Diagnosis not present

## 2022-11-28 DIAGNOSIS — Z823 Family history of stroke: Secondary | ICD-10-CM

## 2022-11-28 DIAGNOSIS — M4802 Spinal stenosis, cervical region: Secondary | ICD-10-CM | POA: Diagnosis not present

## 2022-11-28 DIAGNOSIS — Z833 Family history of diabetes mellitus: Secondary | ICD-10-CM

## 2022-11-28 DIAGNOSIS — I110 Hypertensive heart disease with heart failure: Secondary | ICD-10-CM | POA: Diagnosis not present

## 2022-11-28 DIAGNOSIS — Z7951 Long term (current) use of inhaled steroids: Secondary | ICD-10-CM

## 2022-11-28 DIAGNOSIS — J384 Edema of larynx: Secondary | ICD-10-CM | POA: Diagnosis not present

## 2022-11-28 LAB — HEPATIC FUNCTION PANEL
ALT: 14 U/L (ref 0–44)
AST: 12 U/L — ABNORMAL LOW (ref 15–41)
Albumin: 3.1 g/dL — ABNORMAL LOW (ref 3.5–5.0)
Alkaline Phosphatase: 106 U/L (ref 38–126)
Bilirubin, Direct: <0.1 mg/dL (ref 0.0–0.2)
Total Bilirubin: 0.3 mg/dL (ref 0.3–1.2)
Total Protein: 7 g/dL (ref 6.5–8.1)

## 2022-11-28 LAB — BASIC METABOLIC PANEL
Anion gap: 7 (ref 5–15)
BUN: 28 mg/dL — ABNORMAL HIGH (ref 8–23)
CO2: 34 mmol/L — ABNORMAL HIGH (ref 22–32)
Calcium: 8.4 mg/dL — ABNORMAL LOW (ref 8.9–10.3)
Chloride: 103 mmol/L (ref 98–111)
Creatinine, Ser: 1.41 mg/dL — ABNORMAL HIGH (ref 0.61–1.24)
GFR, Estimated: 55 mL/min — ABNORMAL LOW (ref >60)
Glucose, Bld: 119 mg/dL — ABNORMAL HIGH (ref 70–99)
Potassium: 3.4 mmol/L — ABNORMAL LOW (ref 3.5–5.1)
Sodium: 144 mmol/L (ref 135–145)

## 2022-11-28 LAB — CBC
HCT: 40.8 % (ref 39.0–52.0)
Hemoglobin: 10.5 g/dL — ABNORMAL LOW (ref 13.0–17.0)
MCH: 21.7 pg — ABNORMAL LOW (ref 26.0–34.0)
MCHC: 25.7 g/dL — ABNORMAL LOW (ref 30.0–36.0)
MCV: 84.3 fL (ref 80.0–100.0)
Platelets: 182 10*3/uL (ref 150–400)
RBC: 4.84 MIL/uL (ref 4.22–5.81)
RDW: 18.6 % — ABNORMAL HIGH (ref 11.5–15.5)
WBC: 8.1 10*3/uL (ref 4.0–10.5)
nRBC: 0.7 % — ABNORMAL HIGH (ref 0.0–0.2)

## 2022-11-28 LAB — BRAIN NATRIURETIC PEPTIDE: B Natriuretic Peptide: 27.1 pg/mL (ref 0.0–100.0)

## 2022-11-28 LAB — TROPONIN I (HIGH SENSITIVITY)
Troponin I (High Sensitivity): 8 ng/L (ref <18)
Troponin I (High Sensitivity): 10 ng/L (ref <18)

## 2022-11-28 MED ORDER — SODIUM CHLORIDE 0.9% FLUSH
3.0000 mL | Freq: Two times a day (BID) | INTRAVENOUS | Status: DC
  Administered 2022-11-28 – 2022-12-30 (×31): 3 mL via INTRAVENOUS

## 2022-11-28 MED ORDER — MIDODRINE HCL 5 MG PO TABS
10.0000 mg | ORAL_TABLET | Freq: Two times a day (BID) | ORAL | Status: DC
  Administered 2022-11-29 – 2022-12-01 (×5): 10 mg via ORAL
  Filled 2022-11-28 (×5): qty 2

## 2022-11-28 MED ORDER — DOCUSATE SODIUM 100 MG PO CAPS
100.0000 mg | ORAL_CAPSULE | Freq: Every day | ORAL | Status: DC
  Administered 2022-11-29 – 2023-03-03 (×68): 100 mg via ORAL
  Filled 2022-11-28 (×95): qty 1

## 2022-11-28 MED ORDER — REVEFENACIN 175 MCG/3ML IN SOLN
175.0000 ug | Freq: Every day | RESPIRATORY_TRACT | Status: DC
  Administered 2022-11-29 – 2022-12-31 (×33): 175 ug via RESPIRATORY_TRACT
  Filled 2022-11-28 (×35): qty 3

## 2022-11-28 MED ORDER — ONDANSETRON HCL 4 MG/2ML IJ SOLN: 4.0000 mg | Freq: Four times a day (QID) | INTRAMUSCULAR | Status: DC | PRN

## 2022-11-28 MED ORDER — IPRATROPIUM-ALBUTEROL 0.5-2.5 (3) MG/3ML IN SOLN
3.0000 mL | Freq: Four times a day (QID) | RESPIRATORY_TRACT | Status: DC | PRN
  Administered 2022-11-29: 3 mL via RESPIRATORY_TRACT
  Filled 2022-11-28: qty 3

## 2022-11-28 MED ORDER — SODIUM CHLORIDE 0.9% FLUSH
3.0000 mL | INTRAVENOUS | Status: DC | PRN
  Administered 2022-12-13 – 2022-12-20 (×2): 3 mL via INTRAVENOUS

## 2022-11-28 MED ORDER — ARFORMOTEROL TARTRATE 15 MCG/2ML IN NEBU
15.0000 ug | INHALATION_SOLUTION | Freq: Two times a day (BID) | RESPIRATORY_TRACT | Status: DC
  Administered 2022-11-29 – 2022-12-31 (×63): 15 ug via RESPIRATORY_TRACT
  Filled 2022-11-28 (×68): qty 2

## 2022-11-28 MED ORDER — FUROSEMIDE 10 MG/ML IJ SOLN
80.0000 mg | Freq: Two times a day (BID) | INTRAMUSCULAR | Status: DC
  Administered 2022-11-29 – 2022-12-04 (×12): 80 mg via INTRAVENOUS
  Filled 2022-11-28 (×13): qty 8

## 2022-11-28 MED ORDER — PREDNISONE 10 MG PO TABS
10.0000 mg | ORAL_TABLET | Freq: Every day | ORAL | Status: DC
  Administered 2022-11-29 – 2022-12-03 (×5): 10 mg via ORAL
  Filled 2022-11-28 (×5): qty 1

## 2022-11-28 MED ORDER — SODIUM CHLORIDE 0.9 % IV SOLN: 250.0000 mL | INTRAVENOUS | Status: DC | PRN

## 2022-11-28 MED ORDER — GUAIFENESIN ER 600 MG PO TB12
1200.0000 mg | ORAL_TABLET | Freq: Two times a day (BID) | ORAL | Status: DC
  Administered 2022-11-28 – 2023-01-18 (×103): 1200 mg via ORAL
  Filled 2022-11-28 (×105): qty 2

## 2022-11-28 MED ORDER — FUROSEMIDE 10 MG/ML IJ SOLN
40.0000 mg | Freq: Once | INTRAMUSCULAR | Status: AC
  Administered 2022-11-28: 40 mg via INTRAVENOUS
  Filled 2022-11-28: qty 4

## 2022-11-28 MED ORDER — ACETAMINOPHEN 325 MG PO TABS
650.0000 mg | ORAL_TABLET | ORAL | Status: DC | PRN
  Administered 2022-12-01 – 2023-01-11 (×17): 650 mg via ORAL
  Filled 2022-11-28 (×17): qty 2

## 2022-11-28 MED ORDER — FERROUS SULFATE 325 (65 FE) MG PO TABS
325.0000 mg | ORAL_TABLET | Freq: Every day | ORAL | Status: DC
  Administered 2022-11-29 – 2023-03-07 (×99): 325 mg via ORAL
  Filled 2022-11-28 (×100): qty 1

## 2022-11-28 MED ORDER — POTASSIUM CHLORIDE CRYS ER 20 MEQ PO TBCR
40.0000 meq | EXTENDED_RELEASE_TABLET | Freq: Once | ORAL | Status: AC
  Administered 2022-11-28: 40 meq via ORAL
  Filled 2022-11-28: qty 2

## 2022-11-28 MED ORDER — BUDESONIDE 0.5 MG/2ML IN SUSP
0.5000 mg | Freq: Two times a day (BID) | RESPIRATORY_TRACT | Status: DC
  Administered 2022-11-29 – 2022-12-03 (×9): 0.5 mg via RESPIRATORY_TRACT
  Filled 2022-11-28 (×9): qty 2

## 2022-11-28 MED ORDER — MELATONIN 5 MG PO TABS
2.5000 mg | ORAL_TABLET | Freq: Every evening | ORAL | Status: DC | PRN
  Administered 2022-11-29 – 2022-12-13 (×12): 2.5 mg via ORAL
  Filled 2022-11-28 (×12): qty 1

## 2022-11-28 MED ORDER — PANTOPRAZOLE SODIUM 40 MG PO TBEC
40.0000 mg | DELAYED_RELEASE_TABLET | Freq: Every day | ORAL | Status: DC
  Administered 2022-11-28 – 2022-12-30 (×33): 40 mg via ORAL
  Filled 2022-11-28 (×33): qty 1

## 2022-11-28 MED ORDER — APIXABAN 2.5 MG PO TABS
2.5000 mg | ORAL_TABLET | Freq: Two times a day (BID) | ORAL | Status: DC
  Administered 2022-11-28 – 2023-01-02 (×70): 2.5 mg via ORAL
  Filled 2022-11-28 (×71): qty 1

## 2022-11-28 MED ORDER — MESALAMINE 400 MG PO CPDR
800.0000 mg | DELAYED_RELEASE_CAPSULE | Freq: Two times a day (BID) | ORAL | Status: DC
  Administered 2022-11-29 – 2023-02-19 (×162): 800 mg via ORAL
  Filled 2022-11-28 (×174): qty 2

## 2022-11-28 NOTE — Assessment & Plan Note (Addendum)
ACS ruled out.
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
## 2022-11-28 NOTE — Assessment & Plan Note (Addendum)
Off midodrine

## 2022-11-28 NOTE — Assessment & Plan Note (Addendum)
BMI 52.65 with last height and weight in computer.

## 2022-11-28 NOTE — ED Triage Notes (Signed)
Pt via [facility] from home. Pt c/o CP for the past 4 days, pt also states he feels like something may be stuck in his trach, Pt is on 8L via trach collar. States he also has been having increased swelling in his feet and numbness in the L hand since yesterday. See previous note from first nurse. Pt is A&Ox4 and NAD

## 2022-11-28 NOTE — ED Provider Notes (Signed)
[facility] Provider Note    Event Date/Time   First MD Initiated Contact with Patient 11/28/22 1509     (approximate)   History   Chest Pain and Leg Swelling   HPI  Gary [name] is a 67 y.o. male  with extensive PMHx here with SOB, hand numbness/swelling, leg swelling. Pt reports that over the past 24 hr he has had worsening L hand swelling, mild numbness, and bilateral leg swelling. Sx feel similar to his CHF exacerbations. Denies any weakness. Reports his hand just feels "asleep" but is intermittent. Has had some mild left neck pain, has been coughing more. No vision changes. Feels like he has had significant increase in bl leg edema.        Physical Exam   Triage Vital Signs: ED Triage Vitals  Encounter Vitals Group     BP 11/28/22 1432 (!) 131/93     Systolic BP Percentile --      Diastolic BP Percentile --      Pulse Rate 11/28/22 1432 (!) 103     Resp 11/28/22 1432 (!) 24     Temp 11/28/22 1432 98.8 F (37.1 C)     Temp Source 11/28/22 1432 Oral     SpO2 11/28/22 1432 91 %     Weight 11/28/22 1433 (!) 434 lb 3.2 oz (197 kg)     Height 11/28/22 1433 6\' 4"  (1.93 m)     Head Circumference --      Peak Flow --      Pain Score 11/28/22 1437 0     Pain Loc --      Pain Education --      Exclude from Growth Chart --     Most recent vital signs: Vitals:   11/28/22 1726 11/28/22 1727  BP: 121/76   Pulse: 89   Resp: (!) 32 (!) 21  Temp:    SpO2: (!) 88%      General: Awake, no distress.  CV:  Good peripheral perfusion. RRR. Resp:  Normal work of breathing. Bilateral rales, tachypnea. Transmitted upper airway sounds from trach. Abd:  No distention. No tenderness.  Other:  2+ pitting edema bl LE. Mild asymmetric edema L hand. Strength 5/5, bilateral sensation to light touch intact UE and LE. CNII-XII intact.    ED Results / Procedures / Treatments   Labs (all labs ordered are listed, but only abnormal results are displayed) Labs  Reviewed  BASIC METABOLIC PANEL - Abnormal; Notable for the following components:      Result Value   Potassium 3.4 (*)    CO2 34 (*)    Glucose, Bld 119 (*)    BUN 28 (*)    Creatinine, Ser 1.41 (*)    Calcium 8.4 (*)    GFR, Estimated 55 (*)    All other components within normal limits  CBC - Abnormal; Notable for the following components:   Hemoglobin 10.5 (*)    MCH 21.7 (*)    MCHC 25.7 (*)    RDW 18.6 (*)    nRBC 0.7 (*)    All other components within normal limits  HEPATIC FUNCTION PANEL - Abnormal; Notable for the following components:   Albumin 3.1 (*)    AST 12 (*)    All other components within normal limits  BLOOD GAS, VENOUS - Abnormal; Notable for the following components:   pCO2, Ven 73 (*)    Bicarbonate 41.2 (*)    Acid-Base Excess 12.4 (*)  All other components within normal limits  CULTURE, RESPIRATORY W GRAM STAIN  BRAIN NATRIURETIC PEPTIDE  TROPONIN I (HIGH SENSITIVITY)  TROPONIN I (HIGH SENSITIVITY)     EKG Sinus tachycardia, VR 103. PR 206, QRS 90, QTc 495. No acute ST elevations or depressions.   RADIOLOGY CXR: Vascular congestion, increased edema particularly on the right compared to a previous   I also independently reviewed and agree with radiologist interpretations.   PROCEDURES:  Critical Care performed: No  .1-3 Lead EKG Interpretation  Performed by: Shaune Pollack, MD Authorized by: Shaune Pollack, MD     Interpretation: normal     ECG rate:  90-110 Comments:     Indication: SOB     MEDICATIONS ORDERED IN ED: Medications  furosemide (LASIX) injection 40 mg (40 mg Intravenous Given 11/28/22 1717)     IMPRESSION / MDM / ASSESSMENT AND PLAN / ED COURSE  I reviewed the triage vital signs and the nursing notes.                              Differential diagnosis includes, but is not limited to, CHF, PNA, tracheitis, ACS, anemia, deconditioning, anxiety  Patient's presentation is most consistent with acute  presentation with potential threat to life or bodily function.  The patient is on the cardiac monitor to evaluate for evidence of arrhythmia and/or significant heart rate changes  67 yo F with PMHx HTN, OSA, HFpEF, COPD, chronic trach dependence on 6L [place], here with SOB, swelling, L hand tingling.  Regarding his shortness of breath, the patient appears significantly more hypervolemic than the previous times I have seen him with pitting edema bilateral lower extremities and chest x-ray concerning for edema.  His BNP is normal but I suspect this is erroneously low in the setting of his diastolic failure and obesity.  He is satting as low as 84 on his 8 L which is above his baseline 6 L.  Will give Lasix and plan to admit for this.  Otherwise, regarding his left hand symptoms, I suspect this is due to cervical radiculopathy and mild swelling related to his hypervolemia.  CT head and C-spine show significant degenerative changes.  Been coughing more from his edema.  No other concern for CVA.  Lab work is otherwise overall reassuring.  Creatinine is at baseline.  pCO2 73, likely acute on chronic retention in setting of not using his ventilator at night and obesity hypoventilation.   FINAL CLINICAL IMPRESSION(S) / ED DIAGNOSES   Final diagnoses:  Acute on chronic respiratory failure with hypoxia ([name])  Acute on chronic diastolic heart failure ([name])     Rx / DC Orders   ED Discharge Orders     None        Note:  This document was prepared using Dragon voice recognition software and may include unintentional dictation errors.   Shaune Pollack, MD 11/28/22 1750

## 2022-11-28 NOTE — Assessment & Plan Note (Addendum)
Severe COPD on Combivent, as needed albuterol.

## 2022-11-28 NOTE — Consult Note (Signed)
PHARMACY CONSULT NOTE - FOLLOW UP  Pharmacy Consult for Electrolyte Monitoring and Replacement   Recent Labs: Potassium (mmol/L)  Date Value  11/28/2022 3.4 (L)  04/15/2014 3.9   Magnesium (mg/dL)  Date Value  17/61/6073 2.2  08/21/2012 1.9   Calcium (mg/dL)  Date Value  71/09/2692 8.4 (L)   Calcium, Total (mg/dL)  Date Value  85/46/2703 8.3 (L)   Albumin (g/dL)  Date Value  50/12/3816 3.1 (L)  04/15/2014 2.9 (L)   Phosphorus (mg/dL)  Date Value  29/93/7169 3.6   Sodium (mmol/L)  Date Value  11/28/2022 144  03/15/2020 145 (H)  04/15/2014 140     Assessment: 67 y.o. 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 Ran out of home torsemide and increase extremity swelling and SOB. CXR: Enlarged heart with widening of the mediastinum, unchanged from previous. Vascular congestion. Patient will get lasix 80 mg total on 8/14 and starting 8/15 on lasix IV 80 mg BID. PTA torsemide 40 mg BID.    Goal of Therapy:  K+ > 4 (while on IV lasix), Mg 2  Plan:  Will give Kcl 40 mEq x 1 F/u with AM labs.   Ronnald Ramp ,PharmD Clinical Pharmacist 11/28/2022 7:09 PM

## 2022-11-28 NOTE — Assessment & Plan Note (Addendum)
With creatinine creeping up to 1.74 will hold this evening's torsemide dose and reassess tomorrow.  Patient on torsemide 40 mg twice daily and spironolactone.  No beta-blocker with respiratory issues.

## 2022-11-28 NOTE — Progress Notes (Signed)
Patient seen in er. On 28% trach collar in no distress. Patient with #7 xlt trach. Patient states he did not bring his home unit with him as he did not know was staying and he is not interested in using hospital unit as he is not going to change out his trach. Transferred to second floor. Report given to therapist covering.

## 2022-11-28 NOTE — ED Triage Notes (Signed)
First RN: Pt from home states that he has been having chest pain x 4 days. EKG unremarkable with EMS. Pt "afraid" to be at home alone. Pt given 324 ASA en route. On trach collar 8L.

## 2022-11-28 NOTE — Assessment & Plan Note (Addendum)
Continue home ventilator at night

## 2022-11-28 NOTE — ED Notes (Signed)
Critical results CO2= 73, Po2= 32, pH= 7.36, Bicarb 41.2 from Respiratory, they said he needs ventilator.

## 2022-11-28 NOTE — Assessment & Plan Note (Deleted)
Watch creatinine with diuresis.

## 2022-11-28 NOTE — Assessment & Plan Note (Deleted)
Continue trach collar 10 L.  Continue ventilator at night.

## 2022-11-28 NOTE — ED Notes (Signed)
Order received for bariatric bed. Portable equipment called by this RN, no bariatric bed in facility at this time, order will be placed for bed to be brought to hospital.

## 2022-11-28 NOTE — Assessment & Plan Note (Addendum)
-  Continue home mesalamine

## 2022-11-29 DIAGNOSIS — I5033 Acute on chronic diastolic (congestive) heart failure: Secondary | ICD-10-CM | POA: Diagnosis not present

## 2022-11-29 LAB — CBC WITH DIFFERENTIAL/PLATELET
Abs Immature Granulocytes: 0.05 10*3/uL (ref 0.00–0.07)
Basophils Absolute: 0 10*3/uL (ref 0.0–0.1)
Basophils Relative: 0 %
Eosinophils Absolute: 0.2 10*3/uL (ref 0.0–0.5)
Eosinophils Relative: 2 %
HCT: 38.3 % — ABNORMAL LOW (ref 39.0–52.0)
Hemoglobin: 10.5 g/dL — ABNORMAL LOW (ref 13.0–17.0)
Immature Granulocytes: 1 %
Lymphocytes Relative: 17 %
Lymphs Abs: 1.3 10*3/uL (ref 0.7–4.0)
MCH: 22 pg — ABNORMAL LOW (ref 26.0–34.0)
MCHC: 27.4 g/dL — ABNORMAL LOW (ref 30.0–36.0)
MCV: 80.3 fL (ref 80.0–100.0)
Monocytes Absolute: 0.7 10*3/uL (ref 0.1–1.0)
Monocytes Relative: 9 %
Neutro Abs: 5.7 10*3/uL (ref 1.7–7.7)
Neutrophils Relative %: 71 %
Platelets: 182 10*3/uL (ref 150–400)
RBC: 4.77 MIL/uL (ref 4.22–5.81)
RDW: 18.6 % — ABNORMAL HIGH (ref 11.5–15.5)
WBC: 8 10*3/uL (ref 4.0–10.5)
nRBC: 0.9 % — ABNORMAL HIGH (ref 0.0–0.2)

## 2022-11-29 LAB — BASIC METABOLIC PANEL
Anion gap: 7 (ref 5–15)
BUN: 29 mg/dL — ABNORMAL HIGH (ref 8–23)
CO2: 33 mmol/L — ABNORMAL HIGH (ref 22–32)
Calcium: 8.5 mg/dL — ABNORMAL LOW (ref 8.9–10.3)
Chloride: 103 mmol/L (ref 98–111)
Creatinine, Ser: 1.31 mg/dL — ABNORMAL HIGH (ref 0.61–1.24)
GFR, Estimated: >60 mL/min (ref >60)
Glucose, Bld: 103 mg/dL — ABNORMAL HIGH (ref 70–99)
Potassium: 3.4 mmol/L — ABNORMAL LOW (ref 3.5–5.1)
Sodium: 143 mmol/L (ref 135–145)

## 2022-11-29 LAB — BLOOD GAS, VENOUS
Acid-Base Excess: 12.4 mmol/L — ABNORMAL HIGH (ref 0.0–2.0)
Bicarbonate: 41.2 mmol/L — ABNORMAL HIGH (ref 20.0–28.0)
O2 Saturation: 51.8 %
Patient temperature: 37
pCO2, Ven: 73 mmHg (ref 44–60)
pH, Ven: 7.36 (ref 7.25–7.43)
pO2, Ven: 32 mmHg (ref 32–45)

## 2022-11-29 LAB — MAGNESIUM: Magnesium: 2 mg/dL (ref 1.7–2.4)

## 2022-11-29 MED ORDER — ORAL CARE MOUTH RINSE
15.0000 mL | OROMUCOSAL | Status: DC
  Administered 2022-11-29 – 2023-01-19 (×159): 15 mL via OROMUCOSAL

## 2022-11-29 MED ORDER — POTASSIUM CHLORIDE CRYS ER 20 MEQ PO TBCR
40.0000 meq | EXTENDED_RELEASE_TABLET | Freq: Once | ORAL | Status: AC
  Administered 2022-11-29: 40 meq via ORAL
  Filled 2022-11-29: qty 2

## 2022-11-29 MED ORDER — ORAL CARE MOUTH RINSE: 15.0000 mL | OROMUCOSAL | Status: DC | PRN

## 2022-11-29 NOTE — Progress Notes (Addendum)
Pt was asking about his shoes., wanting to wear it ED was called and per RN took care of pt :"Pt did not bring shoes."Pt chart belongings charted did not include shoes. Pt made aware. Pt got upset and forcefully banged left arm on armchair. Charge nurse was Programmer, applications. AC made aware. Will continue to monitor.

## 2022-11-29 NOTE — Progress Notes (Signed)
PROGRESS NOTE    Gary [name]  XLK:[id number] DOB: 1955/10/08 DOA: 11/28/2022 PCP: [facility]  [id number]  LOS: 0 days   Brief hospital course:   Assessment & Plan: Gary [name] is a 67 y.o. 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   Gary [name] states that he ran out of his home torsemide several days ago due to a mixup with his pharmacy.  Then over the last couple days, he has noticed increasing lower extremity swelling, shortness of breath and chest pain.  He states that he usually develops chest pain when he has "fluid on his lungs."     * Acute on chronic diastolic CHF (congestive heart failure) ([name]) Patient is presenting with increased work of breathing, shortness of breath and evidence of hypervolemia on examination.  CXR showed Underinflation with bilateral areas of scarring or atelectatic Change, and vascular congestion. Recent echocardiogram in July 2024 with preserved EF at 55-60% and grade 1 diastolic dysfunction. --cont IV lasix 80 BID  Chronic respiratory failure with hypoxia and hypercapnia ([name]) Patient has a history of chronic hypoxic and hypercapnic respiratory failure, currently with tracheostomy in place.  He is ventilator dependent at bedtime only and on 6-8 L of supplemental oxygen via trach collar during the daytime.  It seems that patient frequently adjusts his own supplemental oxygen amount, making it difficult to know exactly what he is using at home.  VBG notable for significant hypercapnia, however given normal pH, likely chronic - Continue supplemental oxygen via trach collar to maintain oxygen saturation above 88%.  Due to hypercapnia, avoid over oxygenation --pt's family to bring in home vent for night-time use.  Chest pain, ACS  ruled out Patient presented with complaints of chest pain, likely due to heart failure exacerbation.  No EKG changes and troponin is negative.  Severe COPD (chronic obstructive pulmonary disease) ([name]) Severe COPD on triple bronchodilator.  Current shortness of breath most likely secondary to heart failure, rather than COPD though. --cont Brovana and Pulmicort nebs - Frequent suctioning - DuoNebs as needed  Chronic kidney disease, stage 3a ([name]) Renal function is currently at baseline.  --monitor Cr while diuresing  Hypotension - Continue home midodrine  OSA (obstructive sleep apnea) --pt's family to bring in home vent for night-time use.  Chronic colitis - Continue home mesalamine  Morbid obesity with BMI of 50.0-59.9, adult [facility]) Patient has a history of morbid obesity with a BMI of 52, complicating his current medical care.   DVT prophylaxis: [id number] Code Status: Full code  Family Communication:  Level of care: Progressive Dispo:   The patient is from: home Anticipated d/c is to: home Anticipated d/c date is: 2-3 days   Subjective and Interval History:  Pt complained of the feeling that something is stuck in his trach, however, RT suctioned and didn't find anything.  Not much more urine output with IV lasix.    Objective: Vitals:   11/29/22 0759 11/29/22 0804 11/29/22 1244 11/29/22 1605  BP: (!) 122/91  (!) 128/98 (!) 133/96  Pulse: 91  84 85  Resp: 18  18 18   Temp: 98.2 F (36.8 C)  98 F (36.7 C) 98.1 F (36.7 C)  TempSrc:      SpO2: 91% 91% 95% 94%  Weight:  Height:        Intake/Output Summary (Last 24 hours) at 11/29/2022 1742 Last data filed at 11/29/2022 1000 Gross per 24 hour  Intake --  Output 1650 ml  Net -1650 ml   Filed Weights   11/28/22 1433 11/28/22 2118 11/29/22 0712  Weight: (!) 197 kg (!) 189.4 kg (!) 193.2 kg    Examination:   Constitutional: NAD, AAOx3 HEENT: conjunctivae and lids normal, EOMI CV: No cyanosis.   RESP:  normal respiratory effort, on trach Extremities: pitting edema edema in BLE SKIN: warm, dry   Data Reviewed: I have personally reviewed labs and imaging studies  Time spent: 35 minutes  Darlin Priestly, MD [facility] If 7PM-7AM, please contact night-coverage 11/29/2022, 5:42 PM

## 2022-11-29 NOTE — TOC Initial Note (Signed)
Transition of Care [facility]) - Initial/Assessment Note    Patient Details  Name: Gary [name] MRN: [id number] Date of Birth: Aug 28, 1955  Transition of Care [facility]) CM/SW Contact:    [name], LCSW Phone Number: 11/29/2022, 10:03 AM  Clinical Narrative:                  CSW  notes patient is known to [facility] due to multiple readmissions.   Patient is coming from home, last admission to [facility] patient had not completed the paperwork needed for [place] home services to come out. He had reported having the paperwork just not completing it.   Appears most recent admission to [facility] patient discharged to [facility] and was set up with [facility] health services to come out after [phone]) and they were to arrange 120 hours of private duty nursing.   CSW has reached out to [facility] to confirm home health is set up, pending response at this time.   Patient does have NIV and O2 at home via adapt, has all trach supplies needed at home and has been self managing trach since June 2023.   Patient receives medications from [facility]  Patient has home support through girlfriend [name].   TOC will follow for additional discharge planning needs.   Expected Discharge Plan: Home w Home Health Services Barriers to Discharge: Continued Medical Work up   Patient Goals and CMS Choice Patient states their goals for this hospitalization and ongoing recovery are:: to go home          Expected Discharge Plan and Services                                              Prior Living Arrangements/Services                       Activities of Daily Living      Permission Sought/Granted                  Emotional Assessment              Admission diagnosis:  Acute on chronic diastolic heart failure ([name]) [I50.33] Acute on chronic diastolic CHF (congestive heart failure) ([name]) [I50.33] Acute on chronic respiratory failure with hypoxia ([name])  [J96.21] Patient Active Problem List   Diagnosis Date Noted   Acute on chronic respiratory failure ([name]) 11/02/2022   History of DVT (deep vein thrombosis) 10/27/2022   AKI (acute kidney injury) ([name]) 10/27/2022   Acute on chronic respiratory failure with hypoxia ([name]) 10/17/2022   Chronic colitis 09/12/2022   Segmental colitis associated with diverticulosis ([name]) 09/12/2022   Diverticulosis of colon with hemorrhage 09/10/2022   Lower GI bleed 09/07/2022   Chest pain 08/13/2022   Influenza A 04/11/2022   Hypokalemia 03/06/2022   Chronic obstructive pulmonary disease (COPD) ([name]) 02/03/2022   GERD without esophagitis 02/03/2022   Anxiety and depression 02/03/2022   Hematochezia    Major depressive disorder, recurrent episode, moderate ([name]) 10/22/2021   Pressure injury of skin 09/27/2021   COPD (chronic obstructive pulmonary disease) ([name])    Iron deficiency anemia    Chronic kidney disease, stage 3a ([name]) 07/22/2021   Weakness    (HFpEF) heart failure with preserved ejection fraction ([name])    Chronic respiratory failure with hypoxia and hypercapnia ([name])  Hyperlipidemia 02/19/2021   Generalized osteoarthritis of multiple sites 10/31/2020   Hyperkalemia 03/04/2020   Marijuana use 01/17/2020   Thrombocytopenia ([name]) 01/17/2020   Positive hepatitis C antibody test 01/17/2020   Osteoarthritis of glenohumeral joints (Bilateral) 01/17/2020   Osteoarthritis of AC (acromioclavicular) joints (Bilateral) 01/17/2020   Polysubstance abuse ([name]) 08/25/2019   Toxic metabolic encephalopathy 08/25/2019   Chronic pain syndrome 07/14/2019   Anticoagulated 07/14/2019   DDD (degenerative disc disease), lumbosacral 07/14/2019   DDD (degenerative disc disease), cervical 07/14/2019   Acute on chronic diastolic CHF (congestive heart failure) ([name]) 02/28/2015   OSA (obstructive sleep apnea) 02/28/2015   Morbid obesity with BMI of 50.0-59.9, adult ([name]) 04/01/2014   Hypotension 04/01/2014   PCP:   [facility] Pharmacy:   [facility] [place], [place] - [address] [place] [address] Phone: [phone] Fax: [phone]     Social Determinants of Health (SDOH) Social History: SDOH Screenings   Food Insecurity: No Food Insecurity (11/21/2022)   Received from [facility]  Housing: Patient Declined (10/18/2022)  Transportation Needs: No Transportation Needs (11/21/2022)   Received from [facility]  Utilities: Patient Declined (11/20/2022)   Received from [facility]  Depression (PHQ2-9): Low Risk  (06/07/2021)  Financial Resource Strain: High Risk (11/21/2022)   Received from [facility]  Physical Activity: Insufficiently Active (05/03/2017)  Social Connections: Patient Declined (11/20/2022)   Received from [facility]  Stress: Stress Concern Present (11/20/2022)   Received from [facility]  Tobacco Use: Unknown (11/13/2022)   Received from [facility]  Recent Concern: Tobacco Use - Medium Risk (10/16/2022)   SDOH Interventions:     Readmission Risk Interventions    11/05/2022    2:59 PM 11/05/2022   12:16 PM 12/31/2021    8:58 AM  Readmission Risk Prevention Plan  Transportation Screening Complete Complete Complete  Medication Review (RN Care Manager) Complete Complete Complete  PCP or Specialist appointment within 3-5 days of discharge Complete Complete Complete  HRI or Home Care Consult Complete Complete Complete  SW Recovery Care/Counseling Consult Complete Complete Complete  Palliative Care Screening Complete Complete Complete  Skilled Nursing Facility Not Applicable Not Applicable Not Applicable

## 2022-11-29 NOTE — Plan of Care (Signed)
  Problem: Education: ?Goal: Knowledge of General Education information will improve ?Description: Including pain rating scale, medication(s)/side effects and non-pharmacologic comfort measures ?Outcome: Progressing ?  ?Problem: Clinical Measurements: ?Goal: Will remain free from infection ?Outcome: Progressing ?  ?Problem: Clinical Measurements: ?Goal: Respiratory complications will improve ?Outcome: Progressing ?  ?Problem: Clinical Measurements: ?Goal: Cardiovascular complication will be avoided ?Outcome: Progressing ?  ?Problem: Activity: ?Goal: Risk for activity intolerance will decrease ?Outcome: Progressing ?  ?Problem: Pain Managment: ?Goal: General experience of comfort will improve ?Outcome: Progressing ?  ?Problem: Safety: ?Goal: Ability to remain free from injury will improve ?Outcome: Progressing ?  ?

## 2022-11-29 NOTE — Progress Notes (Signed)
   11/29/22 1400  Spiritual Encounters  Type of Visit Initial  Care provided to: Patient  Referral source Chaplain assessment  Reason for visit Routine spiritual support  OnCall Visit No  Spiritual Framework  Presenting Themes Meaning/purpose/sources of inspiration;Goals in life/care;Courage hope and growth  Community/Connection Family;Significant other  Patient Stress Factors Health changes  Family Stress Factors None identified  Interventions  Spiritual Care Interventions Made Compassionate presence;Reflective listening;Meaning making;Self-care teaching;Encouragement;Prayer  Intervention Outcomes  Outcomes Awareness around self/spiritual resourses;Reduced isolation;Awareness of health;Awareness of support  Spiritual Care Plan  Spiritual Care Issues Still Outstanding No further spiritual care needs at this time (see row info)   Chaplain walked by patient's room and recognized him from a previous visit. The patient was happy to see the chaplain and asked if she would pray with him. Chaplain prayed for the patient and offered him encouraging words about his health. Patient shared with chaplain that he wants to live a long life and does not want to continue coming to the hospital. Chaplain encouraged the patient to start taking care of his health by eating healthy foods, exercise and low stress. Patient stated that it is his plan. Chaplain will continue to follow up with the patient.

## 2022-11-29 NOTE — Consult Note (Signed)
PHARMACY CONSULT NOTE - ELECTROLYTES  Pharmacy Consult for Electrolyte Monitoring and Replacement   Recent Labs: Height: 6\' 4"  (193 cm) Weight: (!) 189.4 kg (417 lb 8.8 oz) IBW/kg (Calculated) : 86.8 Estimated Creatinine Clearance: 101.6 mL/min (A) (by C-G formula based on SCr of 1.31 mg/dL (H)). Potassium (mmol/L)  Date Value  11/29/2022 3.4 (L)  04/15/2014 3.9   Magnesium (mg/dL)  Date Value  11/91/4782 2.0  08/21/2012 1.9   Calcium (mg/dL)  Date Value  95/62/1308 8.5 (L)   Calcium, Total (mg/dL)  Date Value  65/78/4696 8.3 (L)   Albumin (g/dL)  Date Value  29/52/8413 3.1 (L)  04/15/2014 2.9 (L)   Phosphorus (mg/dL)  Date Value  24/40/1027 3.6   Sodium (mmol/L)  Date Value  11/29/2022 143  03/15/2020 145 (H)  04/15/2014 140   Corrected Ca: 9.2 mg/dL  Assessment  Gary [name] is a 67 y.o. male presenting with chest pain. 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 Pharmacy has been consulted to monitor and replace electrolytes.  Diet: Heart Healthy diet MIVF: IV lock  Pertinent medications: Furosemide 80mg  IV BID  Goal of Therapy: Electrolytes WNL, Optimize electrolytes  Plan:  Replace K with oral Kcl x 1 dose today Check BMP, Mg, Phos with AM labs  Thank you for allowing pharmacy to be a part of this patient's care.   Rodriguez-Guzman PharmD, BCPS 11/29/2022 9:38 AM

## 2022-11-30 DIAGNOSIS — J0410 Acute tracheitis without obstruction: Secondary | ICD-10-CM | POA: Diagnosis present

## 2022-11-30 DIAGNOSIS — R933 Abnormal findings on diagnostic imaging of other parts of digestive tract: Secondary | ICD-10-CM | POA: Diagnosis not present

## 2022-11-30 DIAGNOSIS — E8770 Fluid overload, unspecified: Secondary | ICD-10-CM | POA: Diagnosis present

## 2022-11-30 DIAGNOSIS — L853 Xerosis cutis: Secondary | ICD-10-CM | POA: Diagnosis not present

## 2022-11-30 DIAGNOSIS — I9589 Other hypotension: Secondary | ICD-10-CM | POA: Diagnosis not present

## 2022-11-30 DIAGNOSIS — R918 Other nonspecific abnormal finding of lung field: Secondary | ICD-10-CM | POA: Diagnosis not present

## 2022-11-30 DIAGNOSIS — H02823 Cysts of right eye, unspecified eyelid: Secondary | ICD-10-CM | POA: Diagnosis not present

## 2022-11-30 DIAGNOSIS — K449 Diaphragmatic hernia without obstruction or gangrene: Secondary | ICD-10-CM | POA: Diagnosis not present

## 2022-11-30 DIAGNOSIS — I110 Hypertensive heart disease with heart failure: Secondary | ICD-10-CM | POA: Diagnosis not present

## 2022-11-30 DIAGNOSIS — Z7901 Long term (current) use of anticoagulants: Secondary | ICD-10-CM | POA: Diagnosis not present

## 2022-11-30 DIAGNOSIS — J811 Chronic pulmonary edema: Secondary | ICD-10-CM | POA: Diagnosis not present

## 2022-11-30 DIAGNOSIS — I5033 Acute on chronic diastolic (congestive) heart failure: Secondary | ICD-10-CM | POA: Diagnosis present

## 2022-11-30 DIAGNOSIS — D62 Acute posthemorrhagic anemia: Secondary | ICD-10-CM | POA: Diagnosis not present

## 2022-11-30 DIAGNOSIS — F32A Depression, unspecified: Secondary | ICD-10-CM | POA: Diagnosis present

## 2022-11-30 DIAGNOSIS — Y848 Other medical procedures as the cause of abnormal reaction of the patient, or of later complication, without mention of misadventure at the time of the procedure: Secondary | ICD-10-CM | POA: Diagnosis not present

## 2022-11-30 DIAGNOSIS — I5032 Chronic diastolic (congestive) heart failure: Secondary | ICD-10-CM | POA: Diagnosis not present

## 2022-11-30 DIAGNOSIS — B965 Pseudomonas (aeruginosa) (mallei) (pseudomallei) as the cause of diseases classified elsewhere: Secondary | ICD-10-CM | POA: Diagnosis present

## 2022-11-30 DIAGNOSIS — J9503 Malfunction of tracheostomy stoma: Secondary | ICD-10-CM | POA: Diagnosis not present

## 2022-11-30 DIAGNOSIS — Z9911 Dependence on respirator [ventilator] status: Secondary | ICD-10-CM | POA: Diagnosis not present

## 2022-11-30 DIAGNOSIS — Z6841 Body Mass Index (BMI) 40.0 and over, adult: Secondary | ICD-10-CM | POA: Diagnosis not present

## 2022-11-30 DIAGNOSIS — L89322 Pressure ulcer of left buttock, stage 2: Secondary | ICD-10-CM | POA: Diagnosis present

## 2022-11-30 DIAGNOSIS — I959 Hypotension, unspecified: Secondary | ICD-10-CM | POA: Diagnosis present

## 2022-11-30 DIAGNOSIS — I672 Cerebral atherosclerosis: Secondary | ICD-10-CM | POA: Diagnosis not present

## 2022-11-30 DIAGNOSIS — K2289 Other specified disease of esophagus: Secondary | ICD-10-CM | POA: Diagnosis not present

## 2022-11-30 DIAGNOSIS — J9612 Chronic respiratory failure with hypercapnia: Secondary | ICD-10-CM | POA: Diagnosis not present

## 2022-11-30 DIAGNOSIS — F419 Anxiety disorder, unspecified: Secondary | ICD-10-CM | POA: Diagnosis not present

## 2022-11-30 DIAGNOSIS — R0989 Other specified symptoms and signs involving the circulatory and respiratory systems: Secondary | ICD-10-CM | POA: Diagnosis not present

## 2022-11-30 DIAGNOSIS — K921 Melena: Secondary | ICD-10-CM | POA: Diagnosis not present

## 2022-11-30 DIAGNOSIS — J439 Emphysema, unspecified: Secondary | ICD-10-CM | POA: Diagnosis not present

## 2022-11-30 DIAGNOSIS — I517 Cardiomegaly: Secondary | ICD-10-CM | POA: Diagnosis not present

## 2022-11-30 DIAGNOSIS — D509 Iron deficiency anemia, unspecified: Secondary | ICD-10-CM | POA: Diagnosis present

## 2022-11-30 DIAGNOSIS — K2270 Barrett's esophagus without dysplasia: Secondary | ICD-10-CM | POA: Diagnosis not present

## 2022-11-30 DIAGNOSIS — I130 Hypertensive heart and chronic kidney disease with heart failure and stage 1 through stage 4 chronic kidney disease, or unspecified chronic kidney disease: Secondary | ICD-10-CM | POA: Diagnosis present

## 2022-11-30 DIAGNOSIS — J9621 Acute and chronic respiratory failure with hypoxia: Secondary | ICD-10-CM | POA: Diagnosis present

## 2022-11-30 DIAGNOSIS — Z87891 Personal history of nicotine dependence: Secondary | ICD-10-CM | POA: Diagnosis not present

## 2022-11-30 DIAGNOSIS — J384 Edema of larynx: Secondary | ICD-10-CM | POA: Diagnosis not present

## 2022-11-30 DIAGNOSIS — N1831 Chronic kidney disease, stage 3a: Secondary | ICD-10-CM | POA: Diagnosis present

## 2022-11-30 DIAGNOSIS — R0789 Other chest pain: Secondary | ICD-10-CM | POA: Diagnosis not present

## 2022-11-30 DIAGNOSIS — J9622 Acute and chronic respiratory failure with hypercapnia: Secondary | ICD-10-CM | POA: Diagnosis present

## 2022-11-30 DIAGNOSIS — Z8616 Personal history of COVID-19: Secondary | ICD-10-CM | POA: Diagnosis not present

## 2022-11-30 DIAGNOSIS — R531 Weakness: Secondary | ICD-10-CM | POA: Diagnosis not present

## 2022-11-30 DIAGNOSIS — M351 Other overlap syndromes: Secondary | ICD-10-CM | POA: Diagnosis present

## 2022-11-30 DIAGNOSIS — J441 Chronic obstructive pulmonary disease with (acute) exacerbation: Secondary | ICD-10-CM | POA: Diagnosis not present

## 2022-11-30 DIAGNOSIS — R0602 Shortness of breath: Secondary | ICD-10-CM | POA: Diagnosis not present

## 2022-11-30 DIAGNOSIS — Z430 Encounter for attention to tracheostomy: Secondary | ICD-10-CM | POA: Diagnosis not present

## 2022-11-30 DIAGNOSIS — Z1152 Encounter for screening for COVID-19: Secondary | ICD-10-CM | POA: Diagnosis not present

## 2022-11-30 DIAGNOSIS — I509 Heart failure, unspecified: Secondary | ICD-10-CM | POA: Diagnosis not present

## 2022-11-30 DIAGNOSIS — R042 Hemoptysis: Secondary | ICD-10-CM | POA: Diagnosis present

## 2022-11-30 DIAGNOSIS — J9601 Acute respiratory failure with hypoxia: Secondary | ICD-10-CM | POA: Diagnosis not present

## 2022-11-30 DIAGNOSIS — J9811 Atelectasis: Secondary | ICD-10-CM | POA: Diagnosis not present

## 2022-11-30 DIAGNOSIS — Z743 Need for continuous supervision: Secondary | ICD-10-CM | POA: Diagnosis not present

## 2022-11-30 DIAGNOSIS — N189 Chronic kidney disease, unspecified: Secondary | ICD-10-CM | POA: Diagnosis not present

## 2022-11-30 DIAGNOSIS — I700 Atherosclerosis of aorta: Secondary | ICD-10-CM | POA: Diagnosis not present

## 2022-11-30 DIAGNOSIS — J9602 Acute respiratory failure with hypercapnia: Secondary | ICD-10-CM | POA: Diagnosis not present

## 2022-11-30 DIAGNOSIS — J9611 Chronic respiratory failure with hypoxia: Secondary | ICD-10-CM | POA: Diagnosis not present

## 2022-11-30 DIAGNOSIS — K529 Noninfective gastroenteritis and colitis, unspecified: Secondary | ICD-10-CM | POA: Diagnosis not present

## 2022-11-30 DIAGNOSIS — J449 Chronic obstructive pulmonary disease, unspecified: Secondary | ICD-10-CM | POA: Diagnosis present

## 2022-11-30 DIAGNOSIS — N179 Acute kidney failure, unspecified: Secondary | ICD-10-CM | POA: Diagnosis present

## 2022-11-30 DIAGNOSIS — R079 Chest pain, unspecified: Secondary | ICD-10-CM | POA: Diagnosis not present

## 2022-11-30 DIAGNOSIS — G4733 Obstructive sleep apnea (adult) (pediatric): Secondary | ICD-10-CM | POA: Diagnosis not present

## 2022-11-30 LAB — CBC
HCT: 39.1 % (ref 39.0–52.0)
Hemoglobin: 10.3 g/dL — ABNORMAL LOW (ref 13.0–17.0)
MCH: 21.5 pg — ABNORMAL LOW (ref 26.0–34.0)
MCHC: 26.3 g/dL — ABNORMAL LOW (ref 30.0–36.0)
MCV: 81.6 fL (ref 80.0–100.0)
Platelets: 179 10*3/uL (ref 150–400)
RBC: 4.79 MIL/uL (ref 4.22–5.81)
RDW: 18.6 % — ABNORMAL HIGH (ref 11.5–15.5)
WBC: 7.7 10*3/uL (ref 4.0–10.5)
nRBC: 1 % — ABNORMAL HIGH (ref 0.0–0.2)

## 2022-11-30 LAB — BASIC METABOLIC PANEL
Anion gap: 7 (ref 5–15)
BUN: 25 mg/dL — ABNORMAL HIGH (ref 8–23)
CO2: 35 mmol/L — ABNORMAL HIGH (ref 22–32)
Calcium: 8.5 mg/dL — ABNORMAL LOW (ref 8.9–10.3)
Chloride: 101 mmol/L (ref 98–111)
Creatinine, Ser: 1.15 mg/dL (ref 0.61–1.24)
GFR, Estimated: >60 mL/min (ref >60)
Glucose, Bld: 108 mg/dL — ABNORMAL HIGH (ref 70–99)
Potassium: 3.3 mmol/L — ABNORMAL LOW (ref 3.5–5.1)
Sodium: 143 mmol/L (ref 135–145)

## 2022-11-30 LAB — MAGNESIUM: Magnesium: 2.2 mg/dL (ref 1.7–2.4)

## 2022-11-30 MED ORDER — HYDROXYZINE HCL 25 MG PO TABS
25.0000 mg | ORAL_TABLET | Freq: Three times a day (TID) | ORAL | Status: DC | PRN
  Administered 2022-11-30 – 2022-12-24 (×42): 25 mg via ORAL
  Filled 2022-11-30 (×45): qty 1

## 2022-11-30 MED ORDER — POTASSIUM CHLORIDE CRYS ER 20 MEQ PO TBCR
40.0000 meq | EXTENDED_RELEASE_TABLET | Freq: Once | ORAL | Status: AC
  Administered 2022-11-30: 40 meq via ORAL
  Filled 2022-11-30: qty 2

## 2022-11-30 NOTE — Consult Note (Signed)
PHARMACY CONSULT NOTE - ELECTROLYTES  Pharmacy Consult for Electrolyte Monitoring and Replacement   Recent Labs: Height: 6\' 4"  (193 cm) Weight: (!) 193.2 kg (425 lb 14.9 oz) IBW/kg (Calculated) : 86.8 Estimated Creatinine Clearance: 117.2 mL/min (by C-G formula based on SCr of 1.15 mg/dL). Potassium (mmol/L)  Date Value  11/30/2022 3.3 (L)  04/15/2014 3.9   Magnesium (mg/dL)  Date Value  16/01/9603 2.2  08/21/2012 1.9   Calcium (mg/dL)  Date Value  54/12/8117 8.5 (L)   Calcium, Total (mg/dL)  Date Value  14/78/2956 8.3 (L)   Albumin (g/dL)  Date Value  21/30/8657 3.1 (L)  04/15/2014 2.9 (L)   Phosphorus (mg/dL)  Date Value  84/69/6295 3.6   Sodium (mmol/L)  Date Value  11/30/2022 143  03/15/2020 145 (H)  04/15/2014 140   Corrected Ca: 9.2 mg/dL  Assessment  Gary [name] is a 67 y.o. male presenting with chest pain. 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 Pharmacy has been consulted to monitor and replace electrolytes.  Diet: Heart Healthy diet MIVF: IV lock  Pertinent medications: Furosemide 80mg  IV BID  Goal of Therapy: Electrolytes WNL, Optimize electrolytes  Plan:  Replace K with oral Kcl x 1 dose today per MD NO additional replacement needed. Check BMP, Mg, Phos with AM labs  Thank you for allowing pharmacy to be a part of this patient's care.  Brek Reece Rodriguez-Guzman PharmD, BCPS 11/30/2022 10:03 AM

## 2022-11-30 NOTE — TOC Progression Note (Signed)
Transition of Care [facility]) - Progression Note    Patient Details  Name: Gary [name] MRN: [id number] Date of Birth: [birth year]  [facility]) CM/SW Contact  Margarito Liner, LCSW Phone Number: 11/30/2022, 10:17 AM  Clinical Narrative:  CSW called and spoke to [facility] with [employer]. He asked CSW to call Greig Castilla at their office that manages this area [phone]). Greig Castilla is off until Monday but CSW spoke to [place] who stated she would email him to see if he would call CSW back sooner.   Expected Discharge Plan: Home w Home Health Services Barriers to Discharge: Continued Medical Work up  Expected Discharge Plan and Services                                               Social Determinants of Health (SDOH) Interventions SDOH Screenings   Food Insecurity: No Food Insecurity (11/21/2022)   Received from [facility]  Housing: Patient Declined (10/18/2022)  Transportation Needs: No Transportation Needs (11/21/2022)   Received from [facility]  Utilities: Patient Declined (11/20/2022)   Received from [facility]  Depression (PHQ2-9): Low Risk  (06/07/2021)  Financial Resource Strain: High Risk (11/21/2022)   Received from [facility]  Physical Activity: Insufficiently Active (05/03/2017)  Social Connections: Patient Declined (11/20/2022)   Received from [facility]  Stress: Stress Concern Present (11/20/2022)   Received from [facility]  Tobacco Use: Unknown (11/13/2022)   Received from [facility]  Recent Concern: Tobacco Use - Medium Risk (10/16/2022)    Readmission Risk Interventions    11/05/2022    2:59 PM 11/05/2022   12:16 PM 12/31/2021    8:58 AM  Readmission Risk Prevention Plan  Transportation Screening Complete Complete Complete  Medication Review Oceanographer) Complete Complete Complete  PCP or Specialist appointment within 3-5 days of discharge Complete Complete Complete  HRI or Home Care Consult Complete  Complete Complete  SW Recovery Care/Counseling Consult Complete Complete Complete  Palliative Care Screening Complete Complete Complete  Skilled Nursing Facility Not Applicable Not Applicable Not Applicable

## 2022-11-30 NOTE — Evaluation (Signed)
Occupational Therapy Evaluation Patient Details Name: Gary [name] MRN: [id number] DOB: 1955/10/06 Today's Date: 11/30/2022   History of Present Illness 67 y/o male presented to [facility] ED on 11/28/22 for chest pain x 4 days. Admitted for acute on chronic CHF. Frequent admissions this year with most recent dsicharge on 11/21/22. PMH includes obesity, hypoxia, respiratory failure with tracheostomy, COPD, GIB, HFpEF.   Clinical Impression   Gary [name] was seen for OT evaluation this date. Prior to hospital admission, pt was recently d/c home on 11/21/22 per chart, with 120 hours PCS for ADL assistance. Pt lives in apartment with girlfriend. Pt currently requires MIN A sup<>sit, IND to pull up higher in bed. IND seated grooming tasks. MAX A for LB access seated EOB. CGA + RW sit<>stand, fair standing balance. Reports BLE swelling limiting walking this date, will trial next session. Pt appears near baseline. Pt would benefit from skilled OT to address noted impairments and functional limitations (see below for any additional details). Upon hospital discharge, recommend OT follow up.    If plan is discharge home, recommend the following: A little help with walking and/or transfers;A lot of help with bathing/dressing/bathroom    Functional Status Assessment  Patient has had a recent decline in their functional status and demonstrates the ability to make significant improvements in function in a reasonable and predictable amount of time.  Equipment Recommendations  None recommended by OT    Recommendations for Other Services       Precautions / Restrictions Precautions Precautions: Fall Precaution Comments: trach collar Restrictions Weight Bearing Restrictions: No      Mobility Bed Mobility Overal bed mobility: Needs Assistance Bed Mobility: Supine to Sit, Sit to Supine     Supine to sit: Min assist Sit to supine: Min assist   General bed mobility comments: assist for BLE management back  into bed. Use of momentum to complete tasks    Transfers Overall transfer level: Needs assistance Equipment used: Rolling walker (2 wheels) Transfers: Sit to/from Stand Sit to Stand: Contact guard assist           General transfer comment: max elevated bed height      Balance Overall balance assessment: Needs assistance Sitting-balance support: Feet supported, No upper extremity supported Sitting balance-[name] Scale: Good     Standing balance support: Bilateral upper extremity supported, Reliant on assistive device for balance, During functional activity Standing balance-[name] Scale: Fair Standing balance comment: tolerates ~2 min                           ADL either performed or assessed with clinical judgement   ADL Overall ADL's : Needs assistance/impaired                                       General ADL Comments: IND seated grooming tasks. MAX A for LB access seated EOB.        Extremity/Trunk Assessment Upper Extremity Assessment Upper Extremity Assessment: Overall WFL for tasks assessed   Lower Extremity Assessment Lower Extremity Assessment: Generalized weakness   Cervical / Trunk Assessment Cervical / Trunk Assessment: Other exceptions Cervical / Trunk Exceptions: morbid obesity   Communication Communication Communication: Passy-Muir valve;No apparent difficulties;Tracheostomy   Cognition Arousal: Alert Behavior During Therapy: WFL for tasks assessed/performed Overall Cognitive Status: Within Functional Limits for tasks assessed  General Comments  trach collar 8L FiO2 28%            Home Living Family/patient expects to be discharged to:: Private residence Living Arrangements: Spouse/significant other Available Help at Discharge: Available PRN/intermittently;Family;Personal care attendant Type of Home: Apartment Home Access: Level entry;Ramped entrance      Home Layout: One level     Bathroom Shower/Tub: Chief Strategy Officer: Handicapped height     Home Equipment: Wheelchair - Nurse, children's (2 wheels);Hospital bed   Additional Comments: per chart 120 hours of PCS      Prior Functioning/Environment Prior Level of Function : Needs assist             Mobility Comments: uses RW for household distances but fatigues quickly ADLs Comments: Reports he needs assist for all aspects of ADLs and IADLs        OT Problem List: Decreased strength;Decreased range of motion;Decreased activity tolerance;Impaired balance (sitting and/or standing);Decreased safety awareness;Decreased knowledge of use of DME or AE;Decreased knowledge of precautions;Cardiopulmonary status limiting activity;Increased edema      OT Treatment/Interventions: Self-care/ADL training;Therapeutic exercise;DME and/or AE instruction;Therapeutic activities;Patient/family education;Balance training    OT Goals(Current goals can be found in the care plan section) Acute Rehab OT Goals Patient Stated Goal: get stronger OT Goal Formulation: With patient Time For Goal Achievement: 12/14/22 Potential to Achieve Goals: Good ADL Goals Pt Will Perform Grooming: with modified independence;standing Pt Will Transfer to Toilet: with modified independence;ambulating;regular height toilet Pt Will Perform Toileting - Clothing Manipulation and hygiene: with mod assist;sit to/from stand  OT Frequency: Min 1X/week    Co-evaluation   Reason for Co-Treatment: For patient/therapist safety;To address functional/ADL transfers PT goals addressed during session: Mobility/safety with mobility        AM-PAC OT "6 Clicks" Daily Activity     Outcome Measure Help from another person eating meals?: None Help from another person taking care of personal grooming?: A Little Help from another person toileting, which includes using toliet, bedpan, or urinal?: A Lot Help  from another person bathing (including washing, rinsing, drying)?: A Lot Help from another person to put on and taking off regular upper body clothing?: A Little Help from another person to put on and taking off regular lower body clothing?: A Lot 6 Click Score: 16   End of Session Equipment Utilized During Treatment: Oxygen;Rolling walker (2 wheels)  Activity Tolerance: Patient tolerated treatment well Patient left: in bed;with call bell/phone within reach  OT Visit Diagnosis: Unsteadiness on feet (R26.81);Other abnormalities of gait and mobility (R26.89);Muscle weakness (generalized) (M62.81);Adult, failure to thrive (R62.7)                Time: 5284-1324 OT Time Calculation (min): 27 min Charges:  OT General Charges $OT Visit: 1 Visit OT Evaluation $OT Eval Moderate Complexity: 1 Mod  Kathie Dike, M.S. OTR/L  11/30/22, 4:00 PM  ascom [phone]

## 2022-11-30 NOTE — Progress Notes (Signed)
Patient on home invasive unit via trach in no distress. Settings per those set via home health company.  O2 in line with vent at 2 liters. Patient in no distress. BBS clear and diminished. HR 85 SpO2 98 RR 18. Tolerating home equipment well. Will continue to monitor.

## 2022-11-30 NOTE — Progress Notes (Signed)
Patient taken off home vent at 0615 and placed back on 28% ATC. Tolerated well.

## 2022-11-30 NOTE — Progress Notes (Signed)
PROGRESS NOTE    Gary [name]  GNF:[id number] DOB: [birth year] DOA: 11/28/2022 [facility]CP: [facility]  234A/[id number]-AA  LOS: 0 days   Brief hospital course:   Assessment & Plan: Gary [name] is a 67 y.o. 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   [name] states that he ran out of his home torsemide several days ago due to a mixup with his pharmacy.  Then over the last couple days, he has noticed increasing lower extremity swelling, shortness of breath and chest pain.  He states that he usually develops chest pain when he has "fluid on his lungs."     * Acute on chronic diastolic CHF (congestive heart failure) ([name]) Patient is presenting with increased work of breathing, shortness of breath and evidence of hypervolemia on examination.  CXR showed Underinflation with bilateral areas of scarring or atelectatic Change, and vascular congestion. Recent echocardiogram in July 2024 with preserved EF at 55-60% and grade 1 diastolic dysfunction. --cont IV lasix 80 BID  Chronic respiratory failure with hypoxia and hypercapnia ([name]) Patient has a history of chronic hypoxic and hypercapnic respiratory failure, currently with tracheostomy in place.  He is ventilator dependent at bedtime only and on 6-8 L of supplemental oxygen via trach collar during the daytime.  It seems that patient frequently adjusts his own supplemental oxygen amount, making it difficult to know exactly what he is using at home.  VBG notable for significant hypercapnia, however given normal pH, likely chronic - Continue supplemental oxygen via trach collar to maintain oxygen saturation above 88%.  Due to hypercapnia, avoid over oxygenation --cont home vent for night-time use  Chest pain, ACS ruled out Patient  presented with complaints of chest pain, likely due to heart failure exacerbation.  No EKG changes and troponin is negative.  Severe COPD (chronic obstructive pulmonary disease) ([name]) Severe COPD on triple bronchodilator.  Current shortness of breath most likely secondary to heart failure, rather than COPD though. --cont Brovana and Pulmicort nebs - Frequent suctioning - DuoNebs as needed  Chronic kidney disease, stage 3a ([name]) Renal function is currently at baseline.  --monitor Cr while diuresing  Hypotension - Continue home midodrine  OSA (obstructive sleep apnea) --cont home vent for night-time use  Chronic colitis - Continue home mesalamine  Morbid obesity with BMI of 50.0-59.9, adult [facility]) Patient has a history of morbid obesity with a BMI of 52, complicating his current medical care.   DVT prophylaxis: QI:[id number] Code Status: Full code  Family Communication:  Level of care: Progressive Dispo:   The patient is from: home Anticipated d/c is to: home Anticipated d/c date is: 2-3 days   Subjective and Interval History:  More urine output charted for the past day, however, pt continued to claim he only urinated once.  RN reported pt feeling anxious this morning.   Objective: Vitals:   11/30/22 0722 11/30/22 0833 11/30/22 1228 11/30/22 1604  BP:  (!) 131/93 (!) 137/101 120/72  Pulse:  84 82 86  Resp:  18 18 20   Temp:  98 F (36.7 C) 98.2 F (36.8 C) 98.2 F (36.8 C)  TempSrc:  Oral    SpO2: 94% 96% 93% 94%  Weight:      Height:        Intake/Output Summary (Last 24 hours)  at 11/30/2022 1737 Last data filed at 11/30/2022 1228 Gross per 24 hour  Intake 120 ml  Output 2950 ml  Net -2830 ml   Filed Weights   11/28/22 1433 11/28/22 2118 11/29/22 0712  Weight: (!) 197 kg (!) 189.4 kg (!) 193.2 kg    Examination:   Constitutional: NAD, AAOx3 HEENT: conjunctivae and lids normal, EOMI CV: No cyanosis.   RESP: trach present Extremities: pitting edema in  BLE SKIN: warm, dry   Data Reviewed: I have personally reviewed labs and imaging studies  Time spent: 35 minutes  Darlin Priestly, MD [facility] If 7PM-7AM, please contact night-coverage 11/30/2022, 5:37 PM

## 2022-11-30 NOTE — Evaluation (Signed)
Physical Therapy Evaluation Patient Details Name: Gary [name] MRN: [id number] DOB: [birth year] Today's Date: 11/30/2022  History of Present Illness  67 y/o male presented to [facility] ED on 11/28/22 for chest pain x 4 days. Admitted for acute on chronic CHF. Frequent admissions this year with most recent admission at [facility] 6/25-7/29/24. PMH includes obesity, hypoxia, respiratory failure with tracheostomy, COPD, GIB, HFpEF.  Clinical Impression  Patient seen in conjunction with OT to maximize patient's tolerance this date. Patient presents with weakness, impaired balance, and decreased activity tolerance. VSS on 8L 28% FiO2 during activity. Requiring overall minA for bed mobility and sit to stand transfer. Patient will have girlfriend at home to assist as needed. Agreed to ambulate next session. Patient will benefit from skilled PT services during acute stay to address listed deficits. Patient will benefit from ongoing therapy at discharge to maximize functional independence and safety.         If plan is discharge home, recommend the following: A little help with walking and/or transfers;A lot of help with bathing/dressing/bathroom;Assist for transportation   Can travel by private vehicle        Equipment Recommendations None recommended by PT  Recommendations for Other Services       Functional Status Assessment Patient has had a recent decline in their functional status and demonstrates the ability to make significant improvements in function in a reasonable and predictable amount of time.     Precautions / Restrictions Precautions Precautions: Fall Precaution Comments: trach collar      Mobility  Bed Mobility Overal bed mobility: Needs Assistance Bed Mobility: Supine to Sit, Sit to Supine     Supine to sit: Min assist Sit to supine: Min assist   General bed mobility comments: assist for LE management back into bed. Use of momentum to complete tasks    Transfers Overall  transfer level: Needs assistance Equipment used: Rolling Kimiye Strathman (2 wheels) Transfers: Sit to/from Stand Sit to Stand: Min assist           General transfer comment: requesting to stand from extremely elevated bed surface    Ambulation/Gait                  Stairs            Wheelchair Mobility     Tilt Bed    Modified Rankin (Stroke Patients Only)       Balance Overall balance assessment: Needs assistance Sitting-balance support: Feet supported, No upper extremity supported Sitting balance-[name] Scale: Good     Standing balance support: Bilateral upper extremity supported, Reliant on assistive device for balance, During functional activity Standing balance-[name] Scale: Poor                               Pertinent Vitals/Pain Pain Assessment Pain Assessment: No/denies pain    Home Living Family/patient expects to be discharged to:: Private residence Living Arrangements: Spouse/significant other Available Help at Discharge: Friend(s);Available PRN/intermittently Type of Home: Apartment Home Access: Level entry;Ramped entrance       Home Layout: One level Home Equipment: Wheelchair - Nurse, children's (2 wheels);Hospital bed      Prior Function Prior Level of Function : Needs assist             Mobility Comments: uses RW for household distances but fatigues quickly ADLs Comments: Reports he needs assist for all aspects of ADLs and IADLs     Extremity/Trunk Assessment  Upper Extremity Assessment Upper Extremity Assessment: Defer to OT evaluation    Lower Extremity Assessment Lower Extremity Assessment: Generalized weakness    Cervical / Trunk Assessment Cervical / Trunk Assessment: Other exceptions Cervical / Trunk Exceptions: morbid obesity  Communication   Communication Communication: Passy-Muir valve;No apparent difficulties;Tracheostomy  Cognition Arousal: Alert Behavior During Therapy: WFL for  tasks assessed/performed Overall Cognitive Status: Within Functional Limits for tasks assessed                                          General Comments General comments (skin integrity, edema, etc.): VSS on 8L 28% FiO2    Exercises     Assessment/Plan    PT Assessment Patient needs continued PT services  PT Problem List Decreased strength;Cardiopulmonary status limiting activity;Decreased activity tolerance;Decreased balance;Decreased mobility       PT Treatment Interventions DME instruction;Gait training;Functional mobility training;Therapeutic activities;Therapeutic exercise;Balance training;Neuromuscular re-education;Patient/family education;Wheelchair mobility training    PT Goals (Current goals can be found in the Care Plan section)  Acute Rehab PT Goals Patient Stated Goal: to get stronger PT Goal Formulation: With patient Time For Goal Achievement: 12/14/22 Potential to Achieve Goals: Good    Frequency Min 1X/week     Co-evaluation PT/OT/SLP Co-Evaluation/Treatment: Yes Reason for Co-Treatment: For patient/therapist safety;To address functional/ADL transfers PT goals addressed during session: Mobility/safety with mobility         AM-PAC PT "6 Clicks" Mobility  Outcome Measure Help needed turning from your back to your side while in a flat bed without using bedrails?: A Little Help needed moving from lying on your back to sitting on the side of a flat bed without using bedrails?: A Little Help needed moving to and from a bed to a chair (including a wheelchair)?: A Little Help needed standing up from a chair using your arms (e.g., wheelchair or bedside chair)?: A Little Help needed to walk in hospital room?: A Little Help needed climbing 3-5 steps with a railing? : Total 6 Click Score: 16    End of Session Equipment Utilized During Treatment: Oxygen Activity Tolerance: Patient tolerated treatment well Patient left: in bed;with call bell/phone  within reach Nurse Communication: Mobility status PT Visit Diagnosis: Muscle weakness (generalized) (M62.81);Difficulty in walking, not elsewhere classified (R26.2)    Time: 4540-9811 PT Time Calculation (min) (ACUTE ONLY): 27 min   Charges:   PT Evaluation $PT Eval Moderate Complexity: 1 Mod   PT General Charges $$ ACUTE PT VISIT: 1 Visit         Maylon Peppers, PT, DPT Physical Therapist - [facility]  [facility]   Chandel Zaun A Kaylan Yates 11/30/2022, 2:52 PM

## 2022-12-01 ENCOUNTER — Encounter: Payer: Self-pay | Admitting: Hospitalist

## 2022-12-01 DIAGNOSIS — I5033 Acute on chronic diastolic (congestive) heart failure: Secondary | ICD-10-CM | POA: Diagnosis not present

## 2022-12-01 LAB — CULTURE, RESPIRATORY W GRAM STAIN: Gram Stain: NONE SEEN

## 2022-12-01 LAB — CBC
HCT: 43.4 % (ref 39.0–52.0)
Hemoglobin: 11.1 g/dL — ABNORMAL LOW (ref 13.0–17.0)
MCH: 21.5 pg — ABNORMAL LOW (ref 26.0–34.0)
MCHC: 25.6 g/dL — ABNORMAL LOW (ref 30.0–36.0)
MCV: 84.1 fL (ref 80.0–100.0)
Platelets: 188 10*3/uL (ref 150–400)
RBC: 5.16 MIL/uL (ref 4.22–5.81)
RDW: 18.6 % — ABNORMAL HIGH (ref 11.5–15.5)
WBC: 7.7 10*3/uL (ref 4.0–10.5)
nRBC: 1.4 % — ABNORMAL HIGH (ref 0.0–0.2)

## 2022-12-01 LAB — BASIC METABOLIC PANEL
Anion gap: 8 (ref 5–15)
BUN: 28 mg/dL — ABNORMAL HIGH (ref 8–23)
CO2: 35 mmol/L — ABNORMAL HIGH (ref 22–32)
Calcium: 8.4 mg/dL — ABNORMAL LOW (ref 8.9–10.3)
Chloride: 99 mmol/L (ref 98–111)
Creatinine, Ser: 1.35 mg/dL — ABNORMAL HIGH (ref 0.61–1.24)
GFR, Estimated: 58 mL/min — ABNORMAL LOW (ref >60)
Glucose, Bld: 149 mg/dL — ABNORMAL HIGH (ref 70–99)
Potassium: 3.5 mmol/L (ref 3.5–5.1)
Sodium: 142 mmol/L (ref 135–145)

## 2022-12-01 LAB — MAGNESIUM: Magnesium: 2.2 mg/dL (ref 1.7–2.4)

## 2022-12-01 LAB — PROCALCITONIN: Procalcitonin: 0.12 ng/mL

## 2022-12-01 MED ORDER — MIDODRINE HCL 5 MG PO TABS
5.0000 mg | ORAL_TABLET | Freq: Two times a day (BID) | ORAL | Status: DC
  Administered 2022-12-01 – 2022-12-04 (×6): 5 mg via ORAL
  Filled 2022-12-01 (×6): qty 1

## 2022-12-01 MED ORDER — MIDODRINE HCL 5 MG PO TABS: 5.0000 mg | ORAL_TABLET | Freq: Two times a day (BID) | ORAL | Status: DC

## 2022-12-01 MED ORDER — POTASSIUM CHLORIDE CRYS ER 20 MEQ PO TBCR
40.0000 meq | EXTENDED_RELEASE_TABLET | Freq: Once | ORAL | Status: AC
  Administered 2022-12-01: 40 meq via ORAL
  Filled 2022-12-01: qty 2

## 2022-12-01 MED ORDER — MIDODRINE HCL 5 MG PO TABS: 10.0000 mg | ORAL_TABLET | Freq: Two times a day (BID) | ORAL | Status: DC

## 2022-12-01 NOTE — Progress Notes (Signed)
PROGRESS NOTE    Gary [name]  ZOX:[id number] DOB: Jun 16, 1955 DOA: 11/28/2022 [facility]CP: [facility]  234A/234A-AA  LOS: 1 day   Brief hospital course:   Assessment & Plan: Gary [name] is a 67 y.o. 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   Gary [name] states that he ran out of his home torsemide several days ago due to a mixup with his pharmacy.  Then over the last couple days, he has noticed increasing lower extremity swelling, shortness of breath and chest pain.  He states that he usually develops chest pain when he has "fluid on his lungs."     * Acute on chronic diastolic CHF (congestive heart failure) ([name]) Patient is presenting with increased work of breathing, shortness of breath and evidence of hypervolemia on examination.  CXR showed Underinflation with bilateral areas of scarring or atelectatic Change, and vascular congestion. Recent echocardiogram in July 2024 with preserved EF at 55-60% and grade 1 diastolic dysfunction. --cont IV lasix 80 BID  Chronic respiratory failure with hypoxia and hypercapnia ([name]) Patient has a history of chronic hypoxic and hypercapnic respiratory failure, currently with tracheostomy in place.  He is ventilator dependent at bedtime only and on 6-8 L of supplemental oxygen via trach collar during the daytime.  It seems that patient frequently adjusts his own supplemental oxygen amount, making it difficult to know exactly what he is using at home.  VBG notable for significant hypercapnia, however given normal pH, likely chronic - Continue supplemental oxygen via trach collar to maintain oxygen saturation above 88%.  Due to hypercapnia, avoid over oxygenation --cont home vent for night-time use  Chest pain, ACS ruled out Patient  presented with complaints of chest pain, likely due to heart failure exacerbation.  No EKG changes and troponin is negative.  Severe COPD (chronic obstructive pulmonary disease) ([name]) Severe COPD on triple bronchodilator.  Current shortness of breath most likely secondary to heart failure, rather than COPD though. --cont Brovana and Pulmicort nebs - Frequent suctioning - DuoNebs as needed  Chronic kidney disease, stage 3a ([name]) Renal function is currently at baseline.  --monitor Cr while diuresing  Hx of Hypotension --BP has been wnl --taper down home midodrine to 5 mg BID  OSA (obstructive sleep apnea) --cont home vent for night-time use  Chronic colitis - Continue home mesalamine  Morbid obesity with BMI of 50.0-59.9, adult [facility]) Patient has a history of morbid obesity with a BMI of 52, complicating his current medical care.   DVT prophylaxis: WJ:[id number] Code Status: Full code  Family Communication:  Level of care: Progressive Dispo:   The patient is from: home Anticipated d/c is to: home Anticipated d/c date is: 1-2 days   Subjective and Interval History:  Pt said maybe he felt better.  2.8 L urine output for the past day.   Objective: Vitals:   12/01/22 0600 12/01/22 0733 12/01/22 0800 12/01/22 1200  BP:   123/78 137/77  Pulse:   86 80  Resp:   19   Temp:   98.2 F (36.8 C) 98.4 F (36.9 C)  TempSrc:   Oral Oral  SpO2: 96% 95% 93% 100%  Weight:      Height:        Intake/Output Summary (Last 24 hours) at 12/01/2022 1555 Last data  filed at 12/01/2022 1423 Gross per 24 hour  Intake 840 ml  Output 2700 ml  Net -1860 ml   Filed Weights   11/28/22 2118 11/29/22 0712 12/01/22 0500  Weight: (!) 189.4 kg (!) 193.2 kg (!) 192.2 kg    Examination:   Constitutional: NAD, AAOx3 HEENT: conjunctivae and lids normal, EOMI CV: No cyanosis.   RESP: trach present Extremities: edema in BLE SKIN: warm, dry  Psych: flat mood and affect.     Data Reviewed: I  have personally reviewed labs and imaging studies  Time spent: 35 minutes  Darlin Priestly, MD [facility] If 7PM-7AM, please contact night-coverage 12/01/2022, 3:55 PM

## 2022-12-01 NOTE — Consult Note (Signed)
PHARMACY CONSULT NOTE - ELECTROLYTES  Pharmacy Consult for Electrolyte Monitoring and Replacement   Recent Labs: Height: 6\' 4"  (193 cm) Weight: (!) 192.2 kg (423 lb 11.6 oz) IBW/kg (Calculated) : 86.8 Estimated Creatinine Clearance: 99.5 mL/min (A) (by C-G formula based on SCr of 1.35 mg/dL (H)). Potassium (mmol/L)  Date Value  12/01/2022 3.5  04/15/2014 3.9   Magnesium (mg/dL)  Date Value  65/78/4696 2.2  08/21/2012 1.9   Calcium (mg/dL)  Date Value  29/52/8413 8.4 (L)   Calcium, Total (mg/dL)  Date Value  24/40/1027 8.3 (L)   Albumin (g/dL)  Date Value  25/36/6440 3.1 (L)  04/15/2014 2.9 (L)   Phosphorus (mg/dL)  Date Value  34/74/2595 3.6   Sodium (mmol/L)  Date Value  12/01/2022 142  03/15/2020 145 (H)  04/15/2014 140   Corrected Ca: 9.2 mg/dL  Assessment  Gary [name] is a 67 y.o. male presenting with chest pain. 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 Pharmacy has been consulted to monitor and replace electrolytes.  Diet: Heart Healthy diet MIVF: IV lock  Pertinent medications: Furosemide 80mg  IV BID  Goal of Therapy: Electrolytes WNL, Optimize electrolytes  Plan:  Kcl 40 MEq x 1 per MD.  F/u with AM labs.   Thank you for allowing pharmacy to be a part of this patient's care.  Paschal Dopp, PharmD, BCPS 12/01/2022 9:42 AM

## 2022-12-01 NOTE — TOC Progression Note (Signed)
Transition of Care [facility]) - Progression Note    Patient Details  Name: Gary [name] MRN: [id number] Date of Birth: 10/17/1955  Transition of Care [facility]) CM/SW Contact  [name], RN Phone Number: 12/01/2022, 10:31 AM  Clinical Narrative:  8/17: Pending confirmation call back from [facility] per prior CSW CM note of 11/30/22 regarding services at discharge. RN CM today has not heard anything back.  Gabriel Cirri MSN RN CM  Transitions of Care Department [facility] [phone] Weekends Only       Expected Discharge Plan: Home w Home Health Services Barriers to Discharge: Continued Medical Work up  Expected Discharge Plan and Services                                               Social Determinants of Health (SDOH) Interventions SDOH Screenings   Food Insecurity: No Food Insecurity (12/01/2022)  Housing: Patient Declined (12/01/2022)  Transportation Needs: No Transportation Needs (12/01/2022)  Utilities: Not At Risk (12/01/2022)  Depression (PHQ2-9): Low Risk  (06/07/2021)  Financial Resource Strain: High Risk (11/21/2022)   Received from [facility]  Physical Activity: Insufficiently Active (05/03/2017)  Social Connections: Patient Declined (11/20/2022)   Received from [facility]  Stress: Stress Concern Present (11/20/2022)   Received from [facility]  Tobacco Use: Medium Risk (12/01/2022)    Readmission Risk Interventions    11/05/2022    2:59 PM 11/05/2022   12:16 PM 12/31/2021    8:58 AM  Readmission Risk Prevention Plan  Transportation Screening Complete Complete Complete  Medication Review Oceanographer) Complete Complete Complete  PCP or Specialist appointment within 3-5 days of discharge Complete Complete Complete  HRI or Home Care Consult Complete Complete Complete  SW Recovery Care/Counseling Consult Complete Complete Complete  Palliative Care Screening Complete Complete Complete  Skilled Nursing Facility Not Applicable Not Applicable  Not Applicable

## 2022-12-02 DIAGNOSIS — I5033 Acute on chronic diastolic (congestive) heart failure: Secondary | ICD-10-CM | POA: Diagnosis not present

## 2022-12-02 LAB — CBC
HCT: 41.5 % (ref 39.0–52.0)
Hemoglobin: 10.6 g/dL — ABNORMAL LOW (ref 13.0–17.0)
MCH: 21.6 pg — ABNORMAL LOW (ref 26.0–34.0)
MCHC: 25.5 g/dL — ABNORMAL LOW (ref 30.0–36.0)
MCV: 84.5 fL (ref 80.0–100.0)
Platelets: 192 10*3/uL (ref 150–400)
RBC: 4.91 MIL/uL (ref 4.22–5.81)
RDW: 18.4 % — ABNORMAL HIGH (ref 11.5–15.5)
WBC: 8.8 10*3/uL (ref 4.0–10.5)
nRBC: 1.4 % — ABNORMAL HIGH (ref 0.0–0.2)

## 2022-12-02 LAB — BASIC METABOLIC PANEL
Anion gap: 7 (ref 5–15)
BUN: 29 mg/dL — ABNORMAL HIGH (ref 8–23)
CO2: 34 mmol/L — ABNORMAL HIGH (ref 22–32)
Calcium: 8.5 mg/dL — ABNORMAL LOW (ref 8.9–10.3)
Chloride: 100 mmol/L (ref 98–111)
Creatinine, Ser: 1.27 mg/dL — ABNORMAL HIGH (ref 0.61–1.24)
GFR, Estimated: >60 mL/min (ref >60)
Glucose, Bld: 98 mg/dL (ref 70–99)
Potassium: 4 mmol/L (ref 3.5–5.1)
Sodium: 141 mmol/L (ref 135–145)

## 2022-12-02 LAB — MAGNESIUM: Magnesium: 2.4 mg/dL (ref 1.7–2.4)

## 2022-12-02 MED ORDER — POTASSIUM CHLORIDE CRYS ER 20 MEQ PO TBCR
20.0000 meq | EXTENDED_RELEASE_TABLET | Freq: Once | ORAL | Status: AC
  Administered 2022-12-02: 20 meq via ORAL
  Filled 2022-12-02: qty 1

## 2022-12-02 NOTE — Consult Note (Signed)
PHARMACY CONSULT NOTE - ELECTROLYTES  Pharmacy Consult for Electrolyte Monitoring and Replacement   Recent Labs: Height: 6\' 4"  (193 cm) Weight: (!) 192.6 kg (424 lb 9.7 oz) IBW/kg (Calculated) : 86.8 Estimated Creatinine Clearance: 105.9 mL/min (A) (by C-G formula based on SCr of 1.27 mg/dL (H)). Potassium (mmol/L)  Date Value  12/02/2022 4.0  04/15/2014 3.9   Magnesium (mg/dL)  Date Value  24/40/1027 2.4  08/21/2012 1.9   Calcium (mg/dL)  Date Value  25/36/6440 8.5 (L)   Calcium, Total (mg/dL)  Date Value  34/74/2595 8.3 (L)   Albumin (g/dL)  Date Value  63/87/5643 3.1 (L)  04/15/2014 2.9 (L)   Phosphorus (mg/dL)  Date Value  32/95/1884 3.6   Sodium (mmol/L)  Date Value  12/02/2022 141  03/15/2020 145 (H)  04/15/2014 140   Corrected Ca: 9.2 mg/dL  Assessment  Gary [name] is a 67 y.o. male presenting with chest pain. 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 Pharmacy has been consulted to monitor and replace electrolytes.  Diet: Heart Healthy diet MIVF: IV lock  Pertinent medications: Furosemide 80mg  IV BID  Goal of Therapy: Electrolytes WNL, Optimize electrolytes  Plan:  Kcl 20 MEq x 1  F/u with AM labs.   Thank you for allowing pharmacy to be a part of this patient's care.  Paschal Dopp, PharmD, BCPS 12/02/2022 8:07 AM

## 2022-12-02 NOTE — Progress Notes (Signed)
Placed pt on his home vent without incident

## 2022-12-02 NOTE — Progress Notes (Signed)
PROGRESS NOTE    Gary [name]  RUE:[id number] DOB: 1956/01/09 DOA: 11/28/2022 PCP: [facility]  [id number]/[id number]  LOS: 2 days   Brief hospital course:   Assessment & Plan: Gary [name] is a 67 y.o. 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   Gary [name] states that he ran out of his home torsemide several days ago due to a mixup with his pharmacy.  Then over the last couple days, he has noticed increasing lower extremity swelling, shortness of breath and chest pain.  He states that he usually develops chest pain when he has "fluid on his lungs."     * Acute on chronic diastolic CHF (congestive heart failure) ([name]) Patient is presenting with increased work of breathing, shortness of breath and evidence of hypervolemia on examination.  CXR showed underinflation with bilateral areas of scarring or atelectatic change, and vascular congestion. Recent echocardiogram in July 2024 with preserved EF at 55-60% and grade 1 diastolic dysfunction. --continues to have good urine output with IV lasix Plan: --cont IV lasix 80 BID   Chronic respiratory failure with hypoxia and hypercapnia ([name]) Patient has a history of chronic hypoxic and hypercapnic respiratory failure, currently with tracheostomy in place.  He is ventilator dependent at bedtime only and on 6-8 L of supplemental oxygen via trach collar during the daytime.  It seems that patient frequently adjusts his own supplemental oxygen amount, making it difficult to know exactly what he is using at home.  VBG notable for significant hypercapnia, however given normal pH, likely chronic - Continue supplemental oxygen via trach collar to maintain oxygen saturation above 88%.  Due to hypercapnia, avoid over oxygenation --cont home vent  for night-time use  Chest pain, ACS ruled out Patient presented with complaints of chest pain, likely due to heart failure exacerbation.  No EKG changes and troponin is negative.  Severe COPD (chronic obstructive pulmonary disease) ([name]) Severe COPD on triple bronchodilator.  Current shortness of breath most likely secondary to heart failure, rather than COPD though. --cont Brovana and Pulmicort nebs - Frequent suctioning - DuoNebs as needed  Chronic kidney disease, stage 3a ([name]) --Cr stable with diuresis --monitor  Hx of Hypotension --BP has been wnl.  On home midodrine 10 mg BID --cont midodrine as 5 mg BID  OSA (obstructive sleep apnea) --cont home vent for night-time use  Chronic colitis - Continue home mesalamine  Morbid obesity with BMI of 50.0-59.9, adult ([name]) Patient has a history of morbid obesity with a BMI of 52, complicating his current medical care.   DVT prophylaxis: JY:NWGNFAO Code Status: Full code  Family Communication:  Level of care: Progressive Dispo:   The patient is from: home Anticipated d/c is to: home Anticipated d/c date is: 1-2 days   Subjective and Interval History:  Continued to have good response to IV lasix.     Objective: Vitals:   12/02/22 0737 12/02/22 0808 12/02/22 1132 12/02/22 1526  BP:  117/78 127/88 116/70  Pulse:  80 83 85  Resp:  (!) 22 (!) 22 (!) 24  Temp:  98.4 F (36.9 C) 98.1 F (36.7 C) 97.9 F (36.6 C)  TempSrc:      SpO2: 95% 93% 94% 92%  Weight:      Height:  Intake/Output Summary (Last 24 hours) at 12/02/2022 1601 Last data filed at 12/02/2022 1529 Gross per 24 hour  Intake 720 ml  Output 3700 ml  Net -2980 ml   Filed Weights   11/29/22 0712 12/01/22 0500 12/02/22 0434  Weight: (!) 193.2 kg (!) 192.2 kg (!) 192.6 kg    Examination:   Constitutional: NAD CV: No cyanosis.   RESP: trach connected to HFNC Extremities: pitting edema in BLE SKIN: warm, dry   Data Reviewed: I have  personally reviewed labs and imaging studies  Time spent: 35 minutes  Darlin Priestly, MD [facility] If 7PM-7AM, please contact night-coverage 12/02/2022, 4:01 PM

## 2022-12-03 DIAGNOSIS — I5033 Acute on chronic diastolic (congestive) heart failure: Secondary | ICD-10-CM | POA: Diagnosis not present

## 2022-12-03 DIAGNOSIS — J441 Chronic obstructive pulmonary disease with (acute) exacerbation: Secondary | ICD-10-CM | POA: Diagnosis not present

## 2022-12-03 DIAGNOSIS — J9602 Acute respiratory failure with hypercapnia: Secondary | ICD-10-CM | POA: Diagnosis not present

## 2022-12-03 DIAGNOSIS — E8770 Fluid overload, unspecified: Secondary | ICD-10-CM | POA: Diagnosis not present

## 2022-12-03 DIAGNOSIS — J9601 Acute respiratory failure with hypoxia: Secondary | ICD-10-CM | POA: Diagnosis not present

## 2022-12-03 LAB — CBC
HCT: 40 % (ref 39.0–52.0)
Hemoglobin: 10.5 g/dL — ABNORMAL LOW (ref 13.0–17.0)
MCH: 21.7 pg — ABNORMAL LOW (ref 26.0–34.0)
MCHC: 26.3 g/dL — ABNORMAL LOW (ref 30.0–36.0)
MCV: 82.8 fL (ref 80.0–100.0)
Platelets: 213 10*3/uL (ref 150–400)
RBC: 4.83 MIL/uL (ref 4.22–5.81)
RDW: 18.5 % — ABNORMAL HIGH (ref 11.5–15.5)
WBC: 9.9 10*3/uL (ref 4.0–10.5)
nRBC: 1.1 % — ABNORMAL HIGH (ref 0.0–0.2)

## 2022-12-03 LAB — BASIC METABOLIC PANEL
Anion gap: 7 (ref 5–15)
BUN: 25 mg/dL — ABNORMAL HIGH (ref 8–23)
CO2: 36 mmol/L — ABNORMAL HIGH (ref 22–32)
Calcium: 8.5 mg/dL — ABNORMAL LOW (ref 8.9–10.3)
Chloride: 97 mmol/L — ABNORMAL LOW (ref 98–111)
Creatinine, Ser: 1.16 mg/dL (ref 0.61–1.24)
GFR, Estimated: >60 mL/min (ref >60)
Glucose, Bld: 140 mg/dL — ABNORMAL HIGH (ref 70–99)
Potassium: 3.9 mmol/L (ref 3.5–5.1)
Sodium: 140 mmol/L (ref 135–145)

## 2022-12-03 LAB — MAGNESIUM: Magnesium: 2.5 mg/dL — ABNORMAL HIGH (ref 1.7–2.4)

## 2022-12-03 MED ORDER — RISAQUAD PO CAPS
1.0000 | ORAL_CAPSULE | Freq: Every day | ORAL | Status: DC
  Administered 2022-12-03 – 2022-12-05 (×3): 1 via ORAL
  Filled 2022-12-03 (×3): qty 1

## 2022-12-03 MED ORDER — RISAQUAD PO CAPS
2.0000 | ORAL_CAPSULE | Freq: Two times a day (BID) | ORAL | Status: DC
  Administered 2022-12-03 – 2023-02-19 (×156): 2 via ORAL
  Filled 2022-12-03 (×156): qty 2

## 2022-12-03 MED ORDER — LEVOFLOXACIN 500 MG PO TABS
750.0000 mg | ORAL_TABLET | Freq: Every day | ORAL | Status: AC
  Administered 2022-12-03 – 2022-12-07 (×5): 750 mg via ORAL
  Filled 2022-12-03 (×5): qty 2

## 2022-12-03 MED ORDER — ALBUTEROL SULFATE (2.5 MG/3ML) 0.083% IN NEBU
2.5000 mg | INHALATION_SOLUTION | Freq: Every day | RESPIRATORY_TRACT | Status: DC
  Administered 2022-12-03 – 2022-12-04 (×2): 2.5 mg via RESPIRATORY_TRACT
  Filled 2022-12-03 (×2): qty 3

## 2022-12-03 MED ORDER — PREDNISONE 10 MG PO TABS
5.0000 mg | ORAL_TABLET | Freq: Every day | ORAL | Status: DC
  Administered 2022-12-04 – 2022-12-31 (×27): 5 mg via ORAL
  Filled 2022-12-03 (×27): qty 1

## 2022-12-03 MED ORDER — BACID PO TABS
2.0000 | ORAL_TABLET | Freq: Two times a day (BID) | ORAL | Status: DC
  Filled 2022-12-03: qty 2

## 2022-12-03 NOTE — Plan of Care (Signed)

## 2022-12-03 NOTE — TOC Progression Note (Signed)
Attempt to reach [place] with [facility]. He was not in the office. Left a message with Thea Silversmith regarding patient HH rec.

## 2022-12-03 NOTE — Progress Notes (Signed)
Occupational Therapy Treatment Patient Details Name: Gary [name] MRN: [id number] DOB: 1955-11-21 Today's Date: 12/03/2022   History of present illness 67 y/o male presented to [facility] ED on 11/28/22 for chest pain x 4 days. Admitted for acute on chronic CHF. Frequent admissions this year with most recent dsicharge on 11/21/22. PMH includes obesity, hypoxia, respiratory failure with tracheostomy, COPD, GIB, HFpEF.   OT comments  Gary [name] was seen for OT/PT co-treatment on this date. Upon arrival to room pt reclined in bed, agreeable to tx. Pt requires MIN A exit bed and standing from max elevated bed height. MAX A don B shoes seated EOB and MAX A + RW pericare in standing. CGA + RW bed>chair pivot t/f. Pt reports anxiousness about falling however follows all commands, agreeable that OOB to chair will be beneficial, and thanks therapist for assisting in hygiene. Pt making good progress toward goals, will continue to follow POC. Discharge recommendation remains appropriate.        If plan is discharge home, recommend the following:  A little help with walking and/or transfers;A lot of help with bathing/dressing/bathroom   Equipment Recommendations  None recommended by OT    Recommendations for Other Services      Precautions / Restrictions Precautions Precautions: Fall Restrictions Weight Bearing Restrictions: No       Mobility Bed Mobility Overal bed mobility: Needs Assistance Bed Mobility: Supine to Sit     Supine to sit: Min assist          Transfers Overall transfer level: Needs assistance Equipment used: Rolling walker (2 wheels) Transfers: Sit to/from Stand, Bed to chair/wheelchair/BSC Sit to Stand: Min assist     Step pivot transfers: Contact guard assist     General transfer comment: max elevated bed height     Balance Overall balance assessment: Needs assistance Sitting-balance support: Feet supported, No upper extremity supported Sitting balance-[name]  Scale: Good     Standing balance support: Single extremity supported Standing balance-[name] Scale: Fair                             ADL either performed or assessed with clinical judgement   ADL Overall ADL's : Needs assistance/impaired                                       General ADL Comments: MAX A don B shoes seated EOB and MAX A + RW pericare in standing.       Cognition Arousal: Alert Behavior During Therapy: WFL for tasks assessed/performed Overall Cognitive Status: Within Functional Limits for tasks assessed                                 General Comments: anxious about falling however follows all commands, agreeable that OOB to chair will be beneficial, and thanks therapist for assisting in hygiene                   Pertinent Vitals/ Pain       Pain Assessment Pain Assessment: No/denies pain   Frequency  Min 1X/week        Progress Toward Goals  OT Goals(current goals can now be found in the care plan section)  Progress towards OT goals: Progressing toward goals  Acute Rehab OT Goals Patient Stated  Goal: to go home OT Goal Formulation: With patient Time For Goal Achievement: 12/14/22 Potential to Achieve Goals: Good ADL Goals Pt Will Perform Grooming: with modified independence;standing Pt Will Transfer to Toilet: with modified independence;ambulating;regular height toilet Pt Will Perform Toileting - Clothing Manipulation and hygiene: with mod assist;sit to/from stand  Plan Discharge plan remains appropriate;Frequency remains appropriate    Co-evaluation    PT/OT/SLP Co-Evaluation/Treatment: Yes Reason for Co-Treatment: For patient/therapist safety;To address functional/ADL transfers PT goals addressed during session: Mobility/safety with mobility OT goals addressed during session: ADL's and self-care      AM-PAC OT "6 Clicks" Daily Activity     Outcome Measure   Help from another person eating  meals?: None Help from another person taking care of personal grooming?: A Little Help from another person toileting, which includes using toliet, bedpan, or urinal?: A Lot Help from another person bathing (including washing, rinsing, drying)?: A Lot Help from another person to put on and taking off regular upper body clothing?: A Little Help from another person to put on and taking off regular lower body clothing?: A Lot 6 Click Score: 16    End of Session Equipment Utilized During Treatment: Oxygen;Rolling walker (2 wheels)  OT Visit Diagnosis: Unsteadiness on feet (R26.81);Other abnormalities of gait and mobility (R26.89);Muscle weakness (generalized) (M62.81);Adult, failure to thrive (R62.7)   Activity Tolerance Patient tolerated treatment well   Patient Left with call bell/phone within reach;in chair   Nurse Communication Mobility status        Time: 6213-0865 OT Time Calculation (min): 29 min  Charges: OT General Charges $OT Visit: 1 Visit OT Treatments $Self Care/Home Management : 8-22 mins  Kathie Dike, M.S. OTR/L  12/03/22, 2:22 PM  ascom [phone]

## 2022-12-03 NOTE — Progress Notes (Signed)
PROGRESS NOTE    Gary [name]  XBJ:[id number] DOB: April 20, 1955 DOA: 11/28/2022 PCP: Shayne Alken, MD  234A/[id number]  LOS: 3 days   Brief hospital course:   Assessment & Plan: Gary [name] is a 67 y.o. 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   Gary [name] states that he ran out of his home torsemide several days ago due to a mixup with his pharmacy.  Then over the last couple days, he has noticed increasing lower extremity swelling, shortness of breath and chest pain.  He states that he usually develops chest pain when he has "fluid on his lungs."     * Acute on chronic diastolic CHF (congestive heart failure) ([name]) Patient is presenting with increased work of breathing, shortness of breath and evidence of hypervolemia on examination.  CXR showed underinflation with bilateral areas of scarring or atelectatic change, and vascular congestion. Recent echocardiogram in July 2024 with preserved EF at 55-60% and grade 1 diastolic dysfunction. --continues to have good urine output with IV lasix Plan: --cont IV lasix 80 BID  Chronic respiratory failure with hypoxia and hypercapnia ([name]) Patient has a history of chronic hypoxic and hypercapnic respiratory failure, currently with tracheostomy in place.  He is ventilator dependent at bedtime only and on 6-8 L of supplemental oxygen via trach collar during the daytime.  It seems that patient frequently adjusts his own supplemental oxygen amount, making it difficult to know exactly what he is using at home.  VBG notable for significant hypercapnia, however given normal pH, likely chronic - Continue supplemental oxygen via trach collar to maintain oxygen saturation above 88%.  Due to hypercapnia, avoid over oxygenation --cont home vent for  night-time use  Pseudomonas tracheitis Patient currently symptomatic reporting pain and tenderness with light manipulation of tracheostomy.  He is chronically colonized but reports unusual symptoms. --pulm consulted today --start Levaquin --start probiotic  Severe COPD (chronic obstructive pulmonary disease) ([name]) Severe COPD on triple bronchodilator.  Current shortness of breath most likely secondary to heart failure, rather than COPD though. --cont Brovana --d/c pulmicort neb by pulm --reduce prednisone 10 to 5 mg daily --IS and PT/OT daily  Chronic kidney disease, stage 3a ([name]) --Cr stable with diuresis --monitor  Hx of Hypotension --BP has been wnl.  On home midodrine 10 mg BID --cont midodrine as 5 mg BID  OSA (obstructive sleep apnea) --cont home vent for night-time use  Chronic colitis - Continue home mesalamine  Morbid obesity with BMI of 50.0-59.9, adult ([name]) Patient has a history of morbid obesity with a BMI of 52, complicating his current medical care.  Chest pain, ACS ruled out Patient presented with complaints of chest pain, likely due to heart failure exacerbation.  No EKG changes and troponin is negative.   DVT prophylaxis: HY:QMVHQIO Code Status: Full code  Family Communication:  Level of care: Progressive Dispo:   The patient is from: home Anticipated d/c is to: home Anticipated d/c date is: 1-2 days   Subjective and Interval History:  Pt complained of throat hurting, and believed he has an infection due to brown mucus at the tip of his trach tube.   Objective: Vitals:   12/03/22 0457 12/03/22 0800 12/03/22 1231 12/03/22 1248  BP:  119/79 (!) 128/90   Pulse:  78 97  Resp:  18 18   Temp:  98.4 F (36.9 C) 97.9 F (36.6 C)   TempSrc:  Oral    SpO2:  97% (!) 79% 90%  Weight: (!) 192.1 kg     Height:        Intake/Output Summary (Last 24 hours) at 12/03/2022 1745 Last data filed at 12/03/2022 1130 Gross per 24 hour  Intake 300 ml   Output 1500 ml  Net -1200 ml   Filed Weights   12/01/22 0500 12/02/22 0434 12/03/22 0457  Weight: (!) 192.2 kg (!) 192.6 kg (!) 192.1 kg    Examination:   Constitutional: NAD, AAOx3 HEENT: conjunctivae and lids normal, EOMI CV: No cyanosis.   RESP: trach Extremities: improved pitting edema in BLE SKIN: warm, dry   Data Reviewed: I have personally reviewed labs and imaging studies  Time spent: 35 minutes  Gary Priestly, MD [facility] If 7PM-7AM, please contact night-coverage 12/03/2022, 5:45 PM

## 2022-12-03 NOTE — Progress Notes (Signed)
Pt placed on his home vent without incident

## 2022-12-03 NOTE — Care Management Important Message (Signed)
Important Message  Patient Details  Name: Gary [name] MRN: [id number] Date of Birth: 11-24-1955   Medicare Important Message Given:  Yes     [name] 12/03/2022, 11:12 AM

## 2022-12-03 NOTE — Progress Notes (Signed)
Pt refused to go on his home ventilator machine. Pt currently on trach color 8l 30%. No resp distress noted at this time. Will continue to monitor.

## 2022-12-03 NOTE — Progress Notes (Signed)
Physical Therapy Treatment Patient Details Name: Gary [name] MRN: [id number] DOB: [birth year] Today's Date: 12/03/2022   History of Present Illness 67 y/o male presented to [facility] ED on 11/28/22 for chest pain x 4 days. Admitted for acute on chronic CHF. Frequent admissions this year with most recent dsicharge on 11/21/22. PMH includes obesity, hypoxia, respiratory failure with tracheostomy, COPD, GIB, HFpEF.    PT Comments  PT/OT co-treat 2/2 to pt's limited activity tolerance and need for +2 assist for pt/staff safety. Pt agrees to session with encouragement. He is well known by author/OT from previous admissions. Seems to be at baseline cognition. He was able to exit R side of bed, stand to BRW and then take ~ 4 steps over to recliner. Tolerated standing ~ 20-30 sec total. Pt overall tolerated session well but continues to like to dictate session progression. Acute PT will continue to follow per current POC.    If plan is discharge home, recommend the following: A little help with walking and/or transfers;A lot of help with bathing/dressing/bathroom;Assist for transportation     Equipment Recommendations  None recommended by PT       Precautions / Restrictions Precautions Precautions: Fall Precaution Comments: trach collar/ Trach Restrictions Weight Bearing Restrictions: No     Mobility  Bed Mobility Overal bed mobility: Needs Assistance Bed Mobility: Supine to Sit  Supine to sit: Min assist, HOB elevated, Used rails, +2 for safety/equipment  General bed mobility comments: Min assist + heavy use of bed functions. HOB elevated + use of bed rails    Transfers Overall transfer level: Needs assistance Equipment used: Rolling walker (2 wheels) Transfers: Sit to/from Stand Sit to Stand: From elevated surface, +2 safety/equipment, Contact guard assist, Min assist Step pivot transfers: Contact guard assist General transfer comment: Pt demands bed height be extremely elevated to stand  however stood easily. Did take steps form EOB > recliner with CGA-min assist with +2 for safety only.    Ambulation/Gait Ambulation/Gait assistance: Contact guard assist, Min assist Gait Distance (Feet): 4 Feet Assistive device: Rolling walker (2 wheels) (Bariatric RW) Gait Pattern/deviations: Step-to pattern, Wide base of support Gait velocity: decreased  General Gait Details: Pt remains only willing to perform gait to recliner. He was easily able to perform with +2 for safety however only CGA-min of one to actually perform. pt endorses wanting to stay in recliner x ~ 3-4 hours.    Balance Overall balance assessment: Needs assistance Sitting-balance support: Feet supported, No upper extremity supported Sitting balance-[name] Scale: Good     Standing balance support: Reliant on assistive device for balance, During functional activity, Bilateral upper extremity supported Standing balance-[name] Scale: Fair Standing balance comment: reliant on RW/AD for all standing activity.     Cognition Arousal: Alert Behavior During Therapy: WFL for tasks assessed/performed Overall Cognitive Status: Within Functional Limits for tasks assessed   General Comments: anxious about falling however follows all commands, agreeable that OOB to chair will be beneficial, and thanks therapist for assisting in hygiene               Pertinent Vitals/Pain Pain Assessment Pain Assessment: No/denies pain     PT Goals (current goals can now be found in the care plan section) Acute Rehab PT Goals Patient Stated Goal: to get stronger then go home Progress towards PT goals: Progressing toward goals    Frequency    Min 1X/week      PT Plan Current plan remains appropriate    Co-evaluation  Reason for Co-Treatment: For patient/therapist safety;To address functional/ADL transfers PT goals addressed during session: Mobility/safety with mobility OT goals addressed during session: ADL's and  self-care      AM-PAC PT "6 Clicks" Mobility   Outcome Measure  Help needed turning from your back to your side while in a flat bed without using bedrails?: A Little Help needed moving from lying on your back to sitting on the side of a flat bed without using bedrails?: A Little Help needed moving to and from a bed to a chair (including a wheelchair)?: A Little Help needed standing up from a chair using your arms (e.g., wheelchair or bedside chair)?: A Little Help needed to walk in hospital room?: A Little Help needed climbing 3-5 steps with a railing? : Total 6 Click Score: 16    End of Session Equipment Utilized During Treatment: Oxygen (4L trach collar) Activity Tolerance: Patient tolerated treatment well;Other (comment) (self limiting) Patient left: in chair;with call bell/phone within reach;with chair alarm set Nurse Communication: Mobility status PT Visit Diagnosis: Muscle weakness (generalized) (M62.81);Difficulty in walking, not elsewhere classified (R26.2)     Time: 6213-0865 PT Time Calculation (min) (ACUTE ONLY): 29 min  Charges:    $Therapeutic Activity: 8-22 mins PT General Charges $$ ACUTE PT VISIT: 1 Visit                    Jetta Lout PTA 12/03/22, 2:39 PM
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
## 2022-12-03 NOTE — Consult Note (Signed)
PHARMACY CONSULT NOTE - ELECTROLYTES  Pharmacy Consult for Electrolyte Monitoring and Replacement   Recent Labs: Height: 6\' 4"  (193 cm) Weight: (!) 192.1 kg (423 lb 8.1 oz) IBW/kg (Calculated) : 86.8 Estimated Creatinine Clearance: 105.7 mL/min (A) (by C-G formula based on SCr of 1.27 mg/dL (H)). Potassium (mmol/L)  Date Value  12/02/2022 4.0  04/15/2014 3.9   Magnesium (mg/dL)  Date Value  16/01/9603 2.4  08/21/2012 1.9   Calcium (mg/dL)  Date Value  54/12/8117 8.5 (L)   Calcium, Total (mg/dL)  Date Value  14/78/2956 8.3 (L)   Albumin (g/dL)  Date Value  21/30/8657 3.1 (L)  04/15/2014 2.9 (L)   Phosphorus (mg/dL)  Date Value  84/69/6295 3.6   Sodium (mmol/L)  Date Value  12/02/2022 141  03/15/2020 145 (H)  04/15/2014 140   Corrected Ca: 9.2 mg/dL  Assessment  Gary [name] is a 68 y.o. male presenting with chest pain. 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 Pharmacy has been consulted to monitor and replace electrolytes.  Diet: Heart Healthy diet MIVF: IV lock  Pertinent medications: Furosemide 80mg  IV BID  Goal of Therapy: Electrolytes WNL, Optimize electrolytes  Plan:  No electrolyte replacement indicated at this time. Will follow AM labs and replace as needed.  Tionne Dayhoff Rodriguez-Guzman PharmD, BCPS 12/03/2022 7:39 AM

## 2022-12-04 DIAGNOSIS — I5033 Acute on chronic diastolic (congestive) heart failure: Secondary | ICD-10-CM | POA: Diagnosis not present

## 2022-12-04 DIAGNOSIS — E8770 Fluid overload, unspecified: Secondary | ICD-10-CM | POA: Diagnosis not present

## 2022-12-04 DIAGNOSIS — J441 Chronic obstructive pulmonary disease with (acute) exacerbation: Secondary | ICD-10-CM | POA: Diagnosis not present

## 2022-12-04 DIAGNOSIS — J9601 Acute respiratory failure with hypoxia: Secondary | ICD-10-CM | POA: Diagnosis not present

## 2022-12-04 DIAGNOSIS — J9602 Acute respiratory failure with hypercapnia: Secondary | ICD-10-CM | POA: Diagnosis not present

## 2022-12-04 LAB — BASIC METABOLIC PANEL
Anion gap: 5 (ref 5–15)
BUN: 23 mg/dL (ref 8–23)
CO2: 37 mmol/L — ABNORMAL HIGH (ref 22–32)
Calcium: 8.4 mg/dL — ABNORMAL LOW (ref 8.9–10.3)
Chloride: 97 mmol/L — ABNORMAL LOW (ref 98–111)
Creatinine, Ser: 1.15 mg/dL (ref 0.61–1.24)
GFR, Estimated: >60 mL/min (ref >60)
Glucose, Bld: 110 mg/dL — ABNORMAL HIGH (ref 70–99)
Potassium: 3.9 mmol/L (ref 3.5–5.1)
Sodium: 139 mmol/L (ref 135–145)

## 2022-12-04 LAB — CBC
HCT: 40.6 % (ref 39.0–52.0)
Hemoglobin: 10.7 g/dL — ABNORMAL LOW (ref 13.0–17.0)
MCH: 21.7 pg — ABNORMAL LOW (ref 26.0–34.0)
MCHC: 26.4 g/dL — ABNORMAL LOW (ref 30.0–36.0)
MCV: 82.4 fL (ref 80.0–100.0)
Platelets: 194 10*3/uL (ref 150–400)
RBC: 4.93 MIL/uL (ref 4.22–5.81)
RDW: 18.5 % — ABNORMAL HIGH (ref 11.5–15.5)
WBC: 8.7 10*3/uL (ref 4.0–10.5)
nRBC: 1.3 % — ABNORMAL HIGH (ref 0.0–0.2)

## 2022-12-04 LAB — MAGNESIUM: Magnesium: 2.3 mg/dL (ref 1.7–2.4)

## 2022-12-04 LAB — SARS CORONAVIRUS 2 BY RT PCR: SARS Coronavirus 2 by RT PCR: NEGATIVE

## 2022-12-04 MED ORDER — ALBUTEROL SULFATE (2.5 MG/3ML) 0.083% IN NEBU
2.5000 mg | INHALATION_SOLUTION | RESPIRATORY_TRACT | Status: DC | PRN
  Administered 2022-12-04 – 2023-01-27 (×15): 2.5 mg via RESPIRATORY_TRACT
  Filled 2022-12-04 (×15): qty 3

## 2022-12-04 MED ORDER — MIDODRINE HCL 5 MG PO TABS
2.5000 mg | ORAL_TABLET | Freq: Two times a day (BID) | ORAL | Status: DC
  Administered 2022-12-05 – 2022-12-07 (×5): 2.5 mg via ORAL
  Filled 2022-12-04 (×5): qty 1

## 2022-12-04 NOTE — Consult Note (Signed)
PHARMACY CONSULT NOTE - ELECTROLYTES  Pharmacy Consult for Electrolyte Monitoring and Replacement   Recent Labs: Height: 6\' 4"  (193 cm) Weight: (!) 192.1 kg (423 lb 8.1 oz) IBW/kg (Calculated) : 86.8 Estimated Creatinine Clearance: 116.8 mL/min (by C-G formula based on SCr of 1.15 mg/dL). Potassium (mmol/L)  Date Value  12/04/2022 3.9  04/15/2014 3.9   Magnesium (mg/dL)  Date Value  40/98/1191 2.3  08/21/2012 1.9   Calcium (mg/dL)  Date Value  47/82/9562 8.4 (L)   Calcium, Total (mg/dL)  Date Value  13/11/6576 8.3 (L)   Albumin (g/dL)  Date Value  46/96/2952 3.1 (L)  04/15/2014 2.9 (L)   Phosphorus (mg/dL)  Date Value  84/13/2440 3.6   Sodium (mmol/L)  Date Value  12/04/2022 139  03/15/2020 145 (H)  04/15/2014 140   Corrected Ca: 9.2 mg/dL  Assessment  Gary [name] is a 67 y.o. male presenting with chest pain. 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 Pharmacy has been consulted to monitor and replace electrolytes.  Diet: Heart Healthy diet MIVF: IV lock  Pertinent medications: Furosemide 80mg  IV BID  Goal of Therapy: Electrolytes WNL, Optimize electrolytes  Plan:  No electrolyte replacement indicated at this time. Will follow AM labs and replace as needed.  [name] PharmD, BCPS 12/04/2022 9:24 AM

## 2022-12-04 NOTE — Plan of Care (Signed)
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
## 2022-12-04 NOTE — Progress Notes (Addendum)
PROGRESS NOTE    Gary [name]  ZOX:[id number] DOB: 1955/06/21 DOA: 11/28/2022 PCP: Gary Alken, MD  [id number]  LOS: 4 days   Brief hospital course:   Assessment & Plan: Gary [name] is a 67 y.o. male with medical history significant of HFpEF with EF of 55-60% and G1DD, chronic hypoxic and hypercapnic respiratory failure s/p tracheostomy 8 L trach collar, COPD, hypertension, OSA, CKD stage IIIa, monthly hospitalizations, who presented to [facility] ED due to chest pain.   Gary [name] stated that he ran out of his home torsemide several days ago due to a mixup with his pharmacy.  Then over Gary last couple days, he has noticed increasing lower extremity swelling, shortness of breath and chest pain.  He states that he usually develops chest pain when he has "fluid on his lungs."     * Acute on chronic diastolic CHF (congestive heart failure) ([name]) [name] is presenting with increased work of breathing, shortness of breath and evidence of hypervolemia on examination.  CXR showed underinflation with bilateral areas of scarring or atelectatic change, and vascular congestion. Recent echocardiogram in July 2024 with preserved EF at 55-60% and grade 1 diastolic dysfunction. --continues to have good urine output with IV lasix with improvement in BLE edema and Cr. Plan: --cont IV lasix 80 BID until urine output slows down or Cr bumps  Chronic respiratory failure with hypoxia and hypercapnia ([name]) [name] has a history of chronic hypoxic and hypercapnic respiratory failure, currently with tracheostomy in place.  He is ventilator dependent at bedtime only and on 6-8 L of supplemental oxygen via trach collar during Gary daytime.  It seems that [name] frequently adjusts his own supplemental oxygen amount, making it difficult to know exactly what he is using at home.  VBG notable for significant hypercapnia, however given normal pH, likely chronic Plan: - Continue supplemental oxygen via trach  collar to maintain oxygen saturation above 88%.  Due to hypercapnia, avoid over oxygenation --cont home vent for night-time use  Pseudomonas tracheitis [name] currently symptomatic reporting pain and tenderness with light manipulation of tracheostomy.  He is chronically colonized but reports unusual symptoms. --pulm consulted and rec Levaquin and probiotics Plan: --cont Levaquin --cont probiotic  Severe COPD (chronic obstructive pulmonary disease) ([name]) Severe COPD on triple bronchodilator.  Current shortness of breath most likely secondary to heart failure, rather than COPD though. --home Pulmicort neb d/c'ed by pulm.  Chronic home prednisone 10 mg daily was reduced to 5 mg daily by pulm. Plan: --cont Brovana --cont prednisone 5 mg daily  --IS and PT/OT daily  Chronic kidney disease, stage 3a ([name]) --Cr improved with diuresis --monitor  Hx of Hypotension --BP has been wnl.  On home midodrine 10 mg BID. --cont to taper down midodrine as 2.5 mg BID.  OSA (obstructive sleep apnea) --cont home vent for night-time use  Chronic colitis - Continue home mesalamine  Morbid obesity with BMI of 50.0-59.9, adult [facility]) [name] has a history of morbid obesity with a BMI of 52, complicating his current medical care.  Chest pain, ACS ruled out [name] presented with complaints of chest pain, likely due to heart failure exacerbation.  No EKG changes and troponin is negative.   DVT prophylaxis: [id number] Code Status: Full code  Family Communication:  Level of care: Progressive Dispo:   Gary [name] is from: home Anticipated d/c is to: home Anticipated d/c date is: 1-2 days.  Still on IV lasix, and said he didn't feel improved and didn't want to "  go home sick."   Subjective and Interval History:  Continued to have great urine output in response to IV lasix, 4300 for Gary past time.  Pt reported continued swelling in his legs.  Also reported feeling like he has COVID and requested  testing, which came back neg.   Objective: Vitals:   12/04/22 0255 12/04/22 0720 12/04/22 1302 12/04/22 1709  BP:  112/71 127/82 136/89  Pulse:  87 80 82  Resp:      Temp:  97.9 F (36.6 C) 98 F (36.7 C) 100 F (37.8 C)  TempSrc:  Oral Oral Oral  SpO2: 94% (!) 89% 95% 93%  Weight:      Height:        Intake/Output Summary (Last 24 hours) at 12/04/2022 1807 Last data filed at 12/04/2022 1300 Gross per 24 hour  Intake 720 ml  Output 3600 ml  Net -2880 ml   Filed Weights   12/01/22 0500 12/02/22 0434 12/03/22 0457  Weight: (!) 192.2 kg (!) 192.6 kg (!) 192.1 kg    Examination:   Constitutional: NAD, AAOx3 HEENT: conjunctivae and lids normal, EOMI CV: No cyanosis.   RESP: trach present Extremities: edema in BLE improved SKIN: warm, dry   Data Reviewed: I have personally reviewed labs and imaging studies  Time spent: 35 minutes  Darlin Priestly, MD [facility] If 7PM-7AM, please contact night-coverage 12/04/2022, 6:07 PM

## 2022-12-04 NOTE — TOC Progression Note (Addendum)
Transition of Care [facility]) - Progression Note    Patient Details  Name: Gary [name] MRN: [id number] Date of Birth: 09/27/1955  Transition of Care [facility]) CM/SW Contact  Truddie Hidden, RN Phone Number: 12/04/2022, 1:11 PM  Clinical Narrative:    Sherron Monday with Greig Castilla from [facility]. Patient was never seen before admitting to [facility]. He is not active on services and will not be admitted at this time due to lack of staff. Per Greig Castilla there may be a chance of availability in the next two weeks. Spoke with Adelina Mings from [place]. Patient accepted.     Expected Discharge Plan: Home w Home Health Services Barriers to Discharge: Continued Medical Work up  Expected Discharge Plan and Services                                               Social Determinants of Health (SDOH) Interventions SDOH Screenings   Food Insecurity: No Food Insecurity (12/01/2022)  Housing: Patient Declined (12/01/2022)  Transportation Needs: No Transportation Needs (12/01/2022)  Utilities: Not At Risk (12/01/2022)  Depression (PHQ2-9): Low Risk  (06/07/2021)  Financial Resource Strain: High Risk (11/21/2022)   Received from [facility]  Physical Activity: Insufficiently Active (05/03/2017)  Social Connections: Patient Declined (11/20/2022)   Received from [facility]  Stress: Stress Concern Present (11/20/2022)   Received from [facility]  Tobacco Use: Medium Risk (12/01/2022)    Readmission Risk Interventions    11/05/2022    2:59 PM 11/05/2022   12:16 PM 12/31/2021    8:58 AM  Readmission Risk Prevention Plan  Transportation Screening Complete Complete Complete  Medication Review Oceanographer) Complete Complete Complete  PCP or Specialist appointment within 3-5 days of discharge Complete Complete Complete  HRI or Home Care Consult Complete Complete Complete  SW Recovery Care/Counseling Consult Complete Complete Complete  Palliative Care Screening Complete Complete Complete  Skilled Nursing  Facility Not Applicable Not Applicable Not Applicable

## 2022-12-05 DIAGNOSIS — J9611 Chronic respiratory failure with hypoxia: Secondary | ICD-10-CM | POA: Diagnosis not present

## 2022-12-05 DIAGNOSIS — E8770 Fluid overload, unspecified: Secondary | ICD-10-CM

## 2022-12-05 DIAGNOSIS — J9601 Acute respiratory failure with hypoxia: Secondary | ICD-10-CM | POA: Diagnosis not present

## 2022-12-05 DIAGNOSIS — J441 Chronic obstructive pulmonary disease with (acute) exacerbation: Secondary | ICD-10-CM | POA: Diagnosis not present

## 2022-12-05 DIAGNOSIS — R079 Chest pain, unspecified: Secondary | ICD-10-CM | POA: Diagnosis not present

## 2022-12-05 DIAGNOSIS — Z6841 Body Mass Index (BMI) 40.0 and over, adult: Secondary | ICD-10-CM | POA: Diagnosis not present

## 2022-12-05 DIAGNOSIS — J449 Chronic obstructive pulmonary disease, unspecified: Secondary | ICD-10-CM | POA: Diagnosis not present

## 2022-12-05 DIAGNOSIS — I5033 Acute on chronic diastolic (congestive) heart failure: Secondary | ICD-10-CM | POA: Diagnosis not present

## 2022-12-05 DIAGNOSIS — J9612 Chronic respiratory failure with hypercapnia: Secondary | ICD-10-CM | POA: Diagnosis not present

## 2022-12-05 DIAGNOSIS — J9602 Acute respiratory failure with hypercapnia: Secondary | ICD-10-CM | POA: Diagnosis not present

## 2022-12-05 LAB — BASIC METABOLIC PANEL
Anion gap: 8 (ref 5–15)
BUN: 29 mg/dL — ABNORMAL HIGH (ref 8–23)
CO2: 36 mmol/L — ABNORMAL HIGH (ref 22–32)
Calcium: 8.7 mg/dL — ABNORMAL LOW (ref 8.9–10.3)
Chloride: 94 mmol/L — ABNORMAL LOW (ref 98–111)
Creatinine, Ser: 1.32 mg/dL — ABNORMAL HIGH (ref 0.61–1.24)
GFR, Estimated: 60 mL/min — ABNORMAL LOW (ref >60)
Glucose, Bld: 123 mg/dL — ABNORMAL HIGH (ref 70–99)
Potassium: 4.1 mmol/L (ref 3.5–5.1)
Sodium: 138 mmol/L (ref 135–145)

## 2022-12-05 LAB — CBC
HCT: 37.5 % — ABNORMAL LOW (ref 39.0–52.0)
Hemoglobin: 9.9 g/dL — ABNORMAL LOW (ref 13.0–17.0)
MCH: 21.6 pg — ABNORMAL LOW (ref 26.0–34.0)
MCHC: 26.4 g/dL — ABNORMAL LOW (ref 30.0–36.0)
MCV: 81.9 fL (ref 80.0–100.0)
Platelets: 208 10*3/uL (ref 150–400)
RBC: 4.58 MIL/uL (ref 4.22–5.81)
RDW: 18.4 % — ABNORMAL HIGH (ref 11.5–15.5)
WBC: 8.7 10*3/uL (ref 4.0–10.5)
nRBC: 0.7 % — ABNORMAL HIGH (ref 0.0–0.2)

## 2022-12-05 LAB — MAGNESIUM: Magnesium: 2.7 mg/dL — ABNORMAL HIGH (ref 1.7–2.4)

## 2022-12-05 MED ORDER — TORSEMIDE 20 MG PO TABS
40.0000 mg | ORAL_TABLET | Freq: Two times a day (BID) | ORAL | Status: DC
  Administered 2022-12-06: 40 mg via ORAL
  Filled 2022-12-05: qty 2

## 2022-12-05 NOTE — Consult Note (Signed)
PHARMACY CONSULT NOTE - ELECTROLYTES  Pharmacy Consult for Electrolyte Monitoring and Replacement   Recent Labs: Height: 6\' 4"  (193 cm) Weight: (!) 192.1 kg (423 lb 8.1 oz) IBW/kg (Calculated) : 86.8 Estimated Creatinine Clearance: 101.7 mL/min (A) (by C-G formula based on SCr of 1.32 mg/dL (H)). Potassium (mmol/L)  Date Value  12/05/2022 4.1  04/15/2014 3.9   Magnesium (mg/dL)  Date Value  16/01/9603 2.7 (H)  08/21/2012 1.9   Calcium (mg/dL)  Date Value  54/12/8117 8.7 (L)   Calcium, Total (mg/dL)  Date Value  14/78/2956 8.3 (L)   Albumin (g/dL)  Date Value  21/30/8657 3.1 (L)  04/15/2014 2.9 (L)   Phosphorus (mg/dL)  Date Value  84/69/6295 3.6   Sodium (mmol/L)  Date Value  12/05/2022 138  03/15/2020 145 (H)  04/15/2014 140    Assessment  Gary [name] is a 67 y.o. male presenting with chest pain. 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 Pharmacy has been consulted to monitor and replace electrolytes.  Diet: Heart Healthy diet MIVF: IV lock  Pertinent medications: Furosemide 80mg  IV BID  Goal of Therapy: Electrolytes WNL, Optimize electrolytes  Plan:  No replacement needed F/u with AM labs.   Paschal Dopp, PharmD, BCPS 12/05/2022 8:39 AM

## 2022-12-05 NOTE — Progress Notes (Signed)
Physical Therapy Treatment Patient Details Name: Gary [name] MRN: [id number] DOB: 12-21-1955 Today's Date: 12/05/2022   History of Present Illness 67 y/o male presented to [facility] ED on 11/28/22 for chest pain x 4 days. Admitted for acute on chronic CHF. Frequent admissions this year with most recent dsicharge on 11/21/22. PMH includes obesity, hypoxia, respiratory failure with tracheostomy, COPD, GIB, HFpEF.    PT Comments  PT/OT co treat 2/2 to pt requiring +2 assistance and poor activity tolerance. Pt was able to exit bed without physical assistance (does use bed functions) stood from extremely elevated bed height and took steps to recliner. Tolerated standing for ~ 1 minute while performing marching in place. Overall pt is somewhat self limiting. Unwilling to attempt further ambulation. Pt tends to dictate session progression. Will continue to follow and progress independence while decreasing caregiver burden.    If plan is discharge home, recommend the following: A little help with walking and/or transfers;A lot of help with bathing/dressing/bathroom;Assist for transportation     Equipment Recommendations  Other (comment) (pt has recieve equipment in previous admissions)       Precautions / Restrictions Precautions Precautions: Fall Precaution Comments: trach collar/ Trach Restrictions Weight Bearing Restrictions: No     Mobility  Bed Mobility Overal bed mobility: Needs Assistance Bed Mobility: Supine to Sit Rolling: Supervision, Used rails     Transfers Overall transfer level: Needs assistance Equipment used: Rolling walker (2 wheels) Transfers: Sit to/from Stand Sit to Stand: Min assist (of one) Step pivot transfers: Contact guard assist  General transfer comment: Pt continues to refuse attempt of standing form lower surface height. Likes to dictate session progression. Stood from extremely elevated bed height with +1 assist. ambulated to recliner and tolerated standing with  BUE support on RW.    Ambulation/Gait Ambulation/Gait assistance: Contact guard assist Gait Distance (Feet): 5 Feet Assistive device: Rolling walker (2 wheels) Gait Pattern/deviations: Step-to pattern, Wide base of support  General Gait Details: pt ambulated from EOB to recliner but was unwilling to ambulate further due to floor being slippy. He has already talked to maintance/EVS about trying to make floor less slippery. Used dysom during STS to prevent sliding but once standing pt had no difficulty taking steps to recliner. stood in front of recliner and performing marching in place alternating 10 x (5 each)    Balance Overall balance assessment: Needs assistance Sitting-balance support: Feet supported, No upper extremity supported Sitting balance-[name] Scale: Good     Standing balance support: Reliant on assistive device for balance, During functional activity Standing balance-[name] Scale: Fair Standing balance comment: Reliant on RW/AD for all standing activity.         Cognition Arousal: Alert Behavior During Therapy: WFL for tasks assessed/performed Overall Cognitive Status: Within Functional Limits for tasks assessed    General Comments: fearful of falling citing "slick floors" however pt is pleasant and expresses gratitude for treatment session               Pertinent Vitals/Pain Pain Assessment Pain Assessment: No/denies pain     PT Goals (current goals can now be found in the care plan section) Acute Rehab PT Goals Patient Stated Goal: to get stronger then go home Progress towards PT goals: Not progressing toward goals - comment (self limiting)    Frequency    Min 1X/week      PT Plan Current plan remains appropriate    Co-evaluation   Reason for Co-Treatment: For patient/therapist safety;To address functional/ADL transfers PT goals  addressed during session: Mobility/safety with mobility OT goals addressed during session: ADL's and self-care       AM-PAC PT "6 Clicks" Mobility   Outcome Measure  Help needed turning from your back to your side while in a flat bed without using bedrails?: A Little Help needed moving from lying on your back to sitting on the side of a flat bed without using bedrails?: A Little Help needed moving to and from a bed to a chair (including a wheelchair)?: A Little Help needed standing up from a chair using your arms (e.g., wheelchair or bedside chair)?: A Little Help needed to walk in hospital room?: A Little Help needed climbing 3-5 steps with a railing? : Total 6 Click Score: 16    End of Session Equipment Utilized During Treatment: Oxygen (4 L mobile trach collar) Activity Tolerance: Patient tolerated treatment well Patient left: in chair;with call bell/phone within reach;with chair alarm set Nurse Communication: Mobility status PT Visit Diagnosis: Muscle weakness (generalized) (M62.81);Difficulty in walking, not elsewhere classified (R26.2)     Time: 6295-2841 PT Time Calculation (min) (ACUTE ONLY): 32 min  Charges:    $Therapeutic Activity: 8-22 mins PT General Charges $$ ACUTE PT VISIT: 1 Visit                     Jetta Lout PTA 12/05/22, 2:33 PM
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
## 2022-12-05 NOTE — Progress Notes (Signed)
PT Cancellation Note  Patient Details Name: Gary [name] MRN: [id number] DOB: March 03, 1956   Cancelled Treatment:     PT attempted 2 x this date. First attempt, pt eating breakfast requesting author return at 11 am.   11am attempt:  pt asleep but easily awakes, " I don't feel good. I think the medicine is making me sick." Pt requested author to return at 1 pm. Will return as requested and continue to follow per current POC.    [name] 12/05/2022, 11:35 AM

## 2022-12-05 NOTE — Hospital Course (Addendum)
67 y.o. 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  Patient initially ran out of torsemide and was found to be in CHF exacerbation.  Eventually became euvolemic and transition to p.o. medications.  Patient has been on trach collar status post course of antibiotics for Pseudomonas tracheitis.  Overall stable on 2 L nasal cannula.  Currently awaiting safe disposition. Difficult to place due to tracheostomy  12/23.  Eliquis held secondary to black stools for the last couple days.  Empirically placed on Protonix. 12/25.  Hemoglobin drifted down to 8.6.  No further bowel movement since yesterday.  Case discussed with gastroenterology since bleeding is stopped we will hold off on procedures.  If rebleeds can consider a bleeding scan. 12/26.  Hemoglobin down to 8.1.  Patient agreeable for an IV for IV iron.  Still no bowel movement since the 24th.

## 2022-12-05 NOTE — Plan of Care (Signed)

## 2022-12-05 NOTE — Progress Notes (Signed)
PROGRESS NOTE    Gary [name]  QIH:[id number] DOB: 02-17-1956 DOA: 11/28/2022 PCP: Shayne Alken, MD  [id number]  LOS: 5 days   Brief hospital course: Gary [name] is a 67 y.o. 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   Gary [name] stated that he ran out of his home torsemide several days ago due to a mixup with his pharmacy.  Then over the last couple days, he has noticed increasing lower extremity swelling, shortness of breath and chest pain.  He states that he usually develops chest pain when he has "fluid on his lungs."    8/21: Clinically improving, IV diuretic discontinued due to worsening creatinine and he will be switched to home torsemide from tomorrow.  Medically stable for discharge but keeps saying that he is still feeling weak and does not want to go home sick.  Refusing to work with PT. original PT recommendations were for home health.  Assessment & Plan:   * Acute on chronic diastolic CHF (congestive heart failure) ([name]) Patient is presenting with increased work of breathing, shortness of breath and evidence of hypervolemia on examination.  CXR showed underinflation with bilateral areas of scarring or atelectatic change, and vascular congestion. Recent echocardiogram in July 2024 with preserved EF at 55-60% and grade 1 diastolic dysfunction. Creatinine increased to 1.36 today. -Stop IV diuresis -Restart home torsemide from tomorrow. -Patient is medically stable for discharge but showing a lot of resistance.  Chronic respiratory failure with hypoxia and hypercapnia ([name]) Patient has a history of chronic hypoxic and hypercapnic respiratory failure, currently with tracheostomy in place.  He is ventilator dependent at bedtime only and on  6-8 L of supplemental oxygen via trach collar during the daytime.  It seems that patient frequently adjusts his own supplemental oxygen amount, making it difficult to know exactly what he is using at home.  VBG notable for significant hypercapnia, however given normal pH, likely chronic Plan: - Continue supplemental oxygen via trach collar to maintain oxygen saturation above 88%.  Due to hypercapnia, avoid over oxygenation --cont home vent for night-time use  Pseudomonas tracheitis Patient currently symptomatic reporting pain and tenderness with light manipulation of tracheostomy.  He is chronically colonized but reports unusual symptoms. --pulm consulted and rec Levaquin and probiotics Plan: --cont Levaquin --cont probiotic  Severe COPD (chronic obstructive pulmonary disease) ([name]) Severe COPD on triple bronchodilator.  Current shortness of breath most likely secondary to heart failure, rather than COPD though. --home Pulmicort neb d/c'ed by pulm.  Chronic home prednisone 10 mg daily was reduced to 5 mg daily by pulm. Plan: --cont Brovana --cont prednisone 5 mg daily  --IS and PT/OT daily  Chronic kidney disease, stage 3a ([name]) --Slight increase in creatinine with IV diuresis today --monitor  Hx of Hypotension --BP has been wnl.  On home midodrine 10 mg BID. --cont to taper down midodrine as 2.5 mg BID.  OSA (obstructive sleep apnea) --cont home vent for night-time use  Chronic colitis - Continue home mesalamine  Morbid obesity with BMI of 50.0-59.9, adult [facility]) Patient has a history of morbid obesity with a BMI of 52, complicating his current medical care.  Chest pain, ACS ruled out Patient presented with complaints of chest pain, likely due to heart failure exacerbation.  No EKG changes and  troponin is negative.   DVT prophylaxis: AO:[id number] Code Status: Full code  Family Communication:  Level of care: Progressive Dispo:   The patient is from: home Anticipated d/c is  to: home with home health Anticipated d/c date is: 1-2 days.  Now medically stable but very resistant for discharge, likely be discharged tomorrow.   Subjective and Interval History:  Patient was seen and examined today.  Does not think that he is at baseline and very resistant to concept of discharging home.  Objective: Vitals:   12/05/22 0800 12/05/22 1213 12/05/22 1220 12/05/22 1237  BP: 124/87  121/82   Pulse: 84  79 78  Resp: 18   20  Temp: 98.2 F (36.8 C) 97.7 F (36.5 C)    TempSrc:      SpO2: 93%  (!) 84% 93%  Weight:      Height:        Intake/Output Summary (Last 24 hours) at 12/05/2022 1421 Last data filed at 12/05/2022 1100 Gross per 24 hour  Intake 660 ml  Output 2050 ml  Net -1390 ml   Filed Weights   12/01/22 0500 12/02/22 0434 12/03/22 0457  Weight: (!) 192.2 kg (!) 192.6 kg (!) 192.1 kg    Examination:   General.  Morbidly obese gentleman, in no acute distress.  Trach collar in place Pulmonary.  Lungs clear bilaterally, normal respiratory effort. CV.  Regular rate and rhythm, no JVD, rub or murmur. Abdomen.  Soft, nontender, nondistended, BS positive. CNS.  Alert and oriented .  No focal neurologic deficit. Extremities.  Trace LE edema, no cyanosis, pulses intact and symmetrical. Psychiatry.  Judgment and insight appears normal.   Data Reviewed: I have personally reviewed labs and imaging studies  Time spent: 45 minutes  This record has been created using Conservation officer, historic buildings. Errors have been sought and corrected,but may not always be located. Such creation errors do not reflect on the standard of care.   Arnetha Courser, MD [facility] If 7PM-7AM, please contact night-coverage 12/05/2022, 2:21 PM

## 2022-12-05 NOTE — TOC Progression Note (Signed)
Transition of Care [facility]) - Progression Note    Patient Details  Name: MYKHAEL SWASEY MRN: [id number] Date of Birth: [birth year]  Transition of Care [facility]) CM/SW Contact  Truddie Hidden, RN Phone Number: 12/05/2022, 12:03 PM  Clinical Narrative:    TOC continuing ongoing assessments for needs that may arise and changes in discharge plan.   Expected Discharge Plan: Home w Home Health Services Barriers to Discharge: Continued Medical Work up  Expected Discharge Plan and Services                                               Social Determinants of Health (SDOH) Interventions SDOH Screenings   Food Insecurity: No Food Insecurity (12/01/2022)  Housing: Patient Declined (12/01/2022)  Transportation Needs: No Transportation Needs (12/01/2022)  Utilities: Not At Risk (12/01/2022)  Depression (PHQ2-9): Low Risk  (06/07/2021)  Financial Resource Strain: High Risk (11/21/2022)   Received from [facility]  Physical Activity: Insufficiently Active (05/03/2017)  Social Connections: Patient Declined (11/20/2022)   Received from [facility]  Stress: Stress Concern Present (11/20/2022)   Received from [facility]  Tobacco Use: Medium Risk (12/01/2022)    Readmission Risk Interventions    11/05/2022    2:59 PM 11/05/2022   12:16 PM 12/31/2021    8:58 AM  Readmission Risk Prevention Plan  Transportation Screening Complete Complete Complete  Medication Review Oceanographer) Complete Complete Complete  PCP or Specialist appointment within 3-5 days of discharge Complete Complete Complete  HRI or Home Care Consult Complete Complete Complete  SW Recovery Care/Counseling Consult Complete Complete Complete  Palliative Care Screening Complete Complete Complete  Skilled Nursing Facility Not Applicable Not Applicable Not Applicable

## 2022-12-06 DIAGNOSIS — E8770 Fluid overload, unspecified: Secondary | ICD-10-CM | POA: Diagnosis not present

## 2022-12-06 DIAGNOSIS — I5033 Acute on chronic diastolic (congestive) heart failure: Secondary | ICD-10-CM | POA: Diagnosis not present

## 2022-12-06 DIAGNOSIS — J9612 Chronic respiratory failure with hypercapnia: Secondary | ICD-10-CM | POA: Diagnosis not present

## 2022-12-06 DIAGNOSIS — Z430 Encounter for attention to tracheostomy: Secondary | ICD-10-CM | POA: Diagnosis not present

## 2022-12-06 DIAGNOSIS — J449 Chronic obstructive pulmonary disease, unspecified: Secondary | ICD-10-CM | POA: Diagnosis not present

## 2022-12-06 DIAGNOSIS — R079 Chest pain, unspecified: Secondary | ICD-10-CM | POA: Diagnosis not present

## 2022-12-06 DIAGNOSIS — Z6841 Body Mass Index (BMI) 40.0 and over, adult: Secondary | ICD-10-CM | POA: Diagnosis not present

## 2022-12-06 DIAGNOSIS — J9602 Acute respiratory failure with hypercapnia: Secondary | ICD-10-CM | POA: Diagnosis not present

## 2022-12-06 DIAGNOSIS — J9611 Chronic respiratory failure with hypoxia: Secondary | ICD-10-CM | POA: Diagnosis not present

## 2022-12-06 DIAGNOSIS — J441 Chronic obstructive pulmonary disease with (acute) exacerbation: Secondary | ICD-10-CM | POA: Diagnosis not present

## 2022-12-06 DIAGNOSIS — J9601 Acute respiratory failure with hypoxia: Secondary | ICD-10-CM | POA: Diagnosis not present

## 2022-12-06 LAB — MAGNESIUM: Magnesium: 2.6 mg/dL — ABNORMAL HIGH (ref 1.7–2.4)

## 2022-12-06 LAB — CBC
HCT: 40.9 % (ref 39.0–52.0)
Hemoglobin: 10.5 g/dL — ABNORMAL LOW (ref 13.0–17.0)
MCH: 21.6 pg — ABNORMAL LOW (ref 26.0–34.0)
MCHC: 25.7 g/dL — ABNORMAL LOW (ref 30.0–36.0)
MCV: 84.2 fL (ref 80.0–100.0)
Platelets: 194 10*3/uL (ref 150–400)
RBC: 4.86 MIL/uL (ref 4.22–5.81)
RDW: 18 % — ABNORMAL HIGH (ref 11.5–15.5)
WBC: 7.7 10*3/uL (ref 4.0–10.5)
nRBC: 0.8 % — ABNORMAL HIGH (ref 0.0–0.2)

## 2022-12-06 LAB — BASIC METABOLIC PANEL
Anion gap: 8 (ref 5–15)
BUN: 26 mg/dL — ABNORMAL HIGH (ref 8–23)
CO2: 36 mmol/L — ABNORMAL HIGH (ref 22–32)
Calcium: 8.5 mg/dL — ABNORMAL LOW (ref 8.9–10.3)
Chloride: 97 mmol/L — ABNORMAL LOW (ref 98–111)
Creatinine, Ser: 1.31 mg/dL — ABNORMAL HIGH (ref 0.61–1.24)
GFR, Estimated: >60 mL/min (ref >60)
Glucose, Bld: 139 mg/dL — ABNORMAL HIGH (ref 70–99)
Potassium: 4 mmol/L (ref 3.5–5.1)
Sodium: 141 mmol/L (ref 135–145)

## 2022-12-06 NOTE — Plan of Care (Signed)

## 2022-12-06 NOTE — TOC Progression Note (Signed)
Transition of Care [facility]) - Progression Note    Patient Details  Name: Gary [name] MRN: [id number] Date of Birth: December 29, 1955  Transition of Care [facility]) CM/SW Contact  Truddie Hidden, RN Phone Number: 12/06/2022, 11:56 AM  Clinical Narrative:    Spoke with patient at his bedside regarding discharge plan. He was advised he was active with [facility] and a representative would call him to scheduled a SOC at discharge.     Expected Discharge Plan: Home w Home Health Services Barriers to Discharge: Continued Medical Work up  Expected Discharge Plan and Services                                               Social Determinants of Health (SDOH) Interventions SDOH Screenings   Food Insecurity: No Food Insecurity (12/01/2022)  Housing: Patient Declined (12/01/2022)  Transportation Needs: No Transportation Needs (12/01/2022)  Utilities: Not At Risk (12/01/2022)  Depression (PHQ2-9): Low Risk  (06/07/2021)  Financial Resource Strain: High Risk (11/21/2022)   [facility] from [facility]  Physical Activity: Insufficiently Active (05/03/2017)  Social Connections: Patient Declined (11/20/2022)   Received from [facility]  Stress: Stress Concern Present (11/20/2022)   Received from [facility]  Tobacco Use: Medium Risk (12/01/2022)    Readmission Risk Interventions    11/05/2022    2:59 PM 11/05/2022   12:16 PM 12/31/2021    8:58 AM  Readmission Risk Prevention Plan  Transportation Screening Complete Complete Complete  Medication Review Oceanographer) Complete Complete Complete  PCP or Specialist appointment within 3-5 days of discharge Complete Complete Complete  HRI or Home Care Consult Complete Complete Complete  SW Recovery Care/Counseling Consult Complete Complete Complete  Palliative Care Screening Complete Complete Complete  Skilled Nursing Facility Not Applicable Not Applicable Not Applicable
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
## 2022-12-06 NOTE — Progress Notes (Signed)
Gary [name] called for help because his trach collar broke.  His assigned NT and RN were in the room, as was I.  Another NT was in hallway who had spoken with Charge nurse who had alerted RT.  He heard the NT in the hallway say "he is ok" and felt like his issue was dismissed, because she was in not in the room and could not known he was "OK".  The NT also stated that RT was on the way and did not hear that.  He verbalized to myself being uncomfortable with that NT and he did not want her to come into his room.  He also talked about another incident that had occurred during hospital visit with the NT.  I met with Renae Fickle, [facility] Judeth [employer]), and [place] Shade Flood) about an incident that occurred this morning.  We talked about how we all have together and both he and the NT need to be comfortable with each other and there may be a time that that individual will have to come take care of him. We also talked about how I had addressed the incident that occurred last visit. He verbalized feelings of frustration regarding his chronic condition, constant hospitalizations, feelings of powerlessness, and was tearful.  He was able to connect his feelings of vulnerability with making assumptions about the intentions of others.  We encouraged visits by the [facility]. [name] acknowledged "what I did was not right" and verbalized a willingness to have a conversation with the NT.  The NT came in the room and talked his care, his needs, and both apologized and agreed to work together for [employer] good.

## 2022-12-06 NOTE — Progress Notes (Signed)
PT Cancellation Note  Patient Details Name: Gary [name] MRN: [id number] DOB: Jan 20, 1956   Cancelled Treatment:     PT attempt. Pt refused. Requested to have PT held today and to return tomorrow. " I don't feel good today."   [name] 12/06/2022, 2:41 PM

## 2022-12-06 NOTE — Consult Note (Signed)
PHARMACY CONSULT NOTE - ELECTROLYTES  Pharmacy Consult for Electrolyte Monitoring and Replacement   Recent Labs: Height: 6\' 4"  (193 cm) Weight: (!) 192.1 kg (423 lb 8.1 oz) IBW/kg (Calculated) : 86.8 Estimated Creatinine Clearance: 102.5 mL/min (A) (by C-G formula based on SCr of 1.31 mg/dL (H)). Potassium (mmol/L)  Date Value  12/06/2022 4.0  04/15/2014 3.9   Magnesium (mg/dL)  Date Value  16/01/9603 2.6 (H)  08/21/2012 1.9   Calcium (mg/dL)  Date Value  54/12/8117 8.5 (L)   Calcium, Total (mg/dL)  Date Value  14/78/2956 8.3 (L)   Albumin (g/dL)  Date Value  21/30/8657 3.1 (L)  04/15/2014 2.9 (L)   Phosphorus (mg/dL)  Date Value  84/69/6295 3.6   Sodium (mmol/L)  Date Value  12/06/2022 141  03/15/2020 145 (H)  04/15/2014 140    Assessment  Gary [name] is a 67 y.o. male presenting with chest pain. 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 Pharmacy has been consulted to monitor and replace electrolytes.  Diet: Heart Healthy diet MIVF: IV lock  Pertinent medications: Furosemide 80mg  IV BID > torsemide.   Goal of Therapy: Electrolytes WNL, Optimize electrolytes  Plan:  No replacement needed. Electrolytes have been stable. Pharmacy will sign off. Please re-consult if needed. Continue to monitor Scr, predict it will stabilize now off IV lasix.    Paschal Dopp, PharmD, BCPS 12/06/2022 8:33 AM
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
## 2022-12-06 NOTE — Care Management Important Message (Signed)
Important Message  Patient Details  Name: Gary [name] MRN: [id number] Date of Birth: 1955-07-31   Medicare Important Message Given:  Yes     [name] 12/06/2022, 2:08 PM

## 2022-12-06 NOTE — Progress Notes (Signed)
PROGRESS NOTE    ALLENMICHAEL [name]  ZOX:[id number] DOB: [birth year] DOA: 11/28/2022 PCP: Shayne Alken, MD  [id number]  LOS: 6 days   Brief hospital course: Gary [name] is a 67 y.o. 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   [name] stated that he ran out of his home torsemide several days ago due to a mixup with his pharmacy.  Then over the last couple days, he has noticed increasing lower extremity swelling, shortness of breath and chest pain.  He states that he usually develops chest pain when he has "fluid on his lungs."    8/21: Clinically improving, IV diuretic discontinued due to worsening creatinine and he will be switched to home torsemide from tomorrow.  Medically stable for discharge but keeps saying that he is still feeling weak and does not want to go home sick.  Refusing to work with PT. original PT recommendations were for home health.  8/22: Medically stable but still not ready to get out of hospital stating that he will be right back if we discharged him.  Want to see an ENT stating that his trach is hurting-ENT was consulted  Assessment & Plan:   * Acute on chronic diastolic CHF (congestive heart failure) ([name]) Patient is presenting with increased work of breathing, shortness of breath and evidence of hypervolemia on examination.  CXR showed underinflation with bilateral areas of scarring or atelectatic change, and vascular congestion. Recent echocardiogram in July 2024 with preserved EF at 55-60% and grade 1 diastolic dysfunction. Creatinine seems stable today -Home torsemide is resumed today -Patient is medically stable for discharge but showing a lot of resistance.  Chronic respiratory failure with hypoxia and hypercapnia  ([name]) Patient has a history of chronic hypoxic and hypercapnic respiratory failure, currently with tracheostomy in place.  He is ventilator dependent at bedtime only and on 6-8 L of supplemental oxygen via trach collar during the daytime.  It seems that patient frequently adjusts his own supplemental oxygen amount, making it difficult to know exactly what he is using at home.  VBG notable for significant hypercapnia, however given normal pH, likely chronic Plan: - Continue supplemental oxygen via trach collar to maintain oxygen saturation above 88%.  Due to hypercapnia, avoid over oxygenation --cont home vent for night-time use  Pseudomonas tracheitis Patient currently symptomatic reporting pain and tenderness with light manipulation of tracheostomy.  He is chronically colonized but reports unusual symptoms. --pulm consulted and rec Levaquin and probiotics Plan: --cont Levaquin --cont probiotic -ENT consult as he was complaining of pain around trach  Severe COPD (chronic obstructive pulmonary disease) ([name]) Severe COPD on triple bronchodilator.  Current shortness of breath most likely secondary to heart failure, rather than COPD though. --home Pulmicort neb d/c'ed by pulm.  Chronic home prednisone 10 mg daily was reduced to 5 mg daily by pulm. Plan: --cont Brovana --cont prednisone 5 mg daily  --IS and PT/OT daily  Chronic kidney disease, stage 3a ([name]) --Slight increase in creatinine with IV diuresis today --monitor  Hx of Hypotension --BP has been wnl.  On home midodrine 10 mg BID. --cont to taper down midodrine as 2.5 mg BID.  OSA (obstructive sleep apnea) --cont home vent for night-time use  Chronic colitis - Continue home mesalamine  Morbid obesity with BMI of  50.0-59.9, adult [facility]) Patient has a history of morbid obesity with a BMI of 52, complicating his current medical care.  Chest pain, ACS ruled out Patient presented with complaints of chest pain, likely due to heart  failure exacerbation.  No EKG changes and troponin is negative.   DVT prophylaxis: [id number] Code Status: Full code  Family Communication:  Level of care: Progressive Dispo:   The patient is from: home Anticipated d/c is to: home with home health Anticipated d/c date is: 1-2 days.  Now medically stable but very resistant for discharge, likely be discharged tomorrow.   Subjective and Interval History:  Patient with no new concern but keeps saying that I am not feeling well and will not go home at this time.  Wants to see an ENT stating that his trach is bothering him.  Objective: Vitals:   12/06/22 0725 12/06/22 0821 12/06/22 1346 12/06/22 1600  BP:  123/84 134/85 126/80  Pulse: 80 81 86 83  Resp: 18 16  16   Temp:  98 F (36.7 C) 98.1 F (36.7 C) 97.9 F (36.6 C)  TempSrc:  Oral Oral   SpO2: 90% 97% 95% 98%  Weight:      Height:        Intake/Output Summary (Last 24 hours) at 12/06/2022 1614 Last data filed at 12/06/2022 1556 Gross per 24 hour  Intake 780 ml  Output 2100 ml  Net -1320 ml   Filed Weights   12/01/22 0500 12/02/22 0434 12/03/22 0457  Weight: (!) 192.2 kg (!) 192.6 kg (!) 192.1 kg    Examination:   General.  Morbidly obese gentleman, in no acute distress.  Trach collar in place. Pulmonary.  Lungs clear bilaterally, normal respiratory effort. CV.  Regular rate and rhythm, no JVD, rub or murmur. Abdomen.  Soft, nontender, nondistended, BS positive. CNS.  Alert and oriented .  No focal neurologic deficit. Extremities.  1+ LE edema, no cyanosis, pulses intact and symmetrical. Psychiatry.  Judgment and insight appears normal.   Data Reviewed: I have personally reviewed labs and imaging studies  Time spent: 44 minutes  This record has been created using Conservation officer, historic buildings. Errors have been sought and corrected,but may not always be located. Such creation errors do not reflect on the standard of care.   Arnetha Courser, MD [facility] If 7PM-7AM, please contact night-coverage 12/06/2022, 4:14 PM

## 2022-12-07 DIAGNOSIS — J449 Chronic obstructive pulmonary disease, unspecified: Secondary | ICD-10-CM | POA: Diagnosis not present

## 2022-12-07 DIAGNOSIS — J9601 Acute respiratory failure with hypoxia: Secondary | ICD-10-CM | POA: Diagnosis not present

## 2022-12-07 DIAGNOSIS — I5033 Acute on chronic diastolic (congestive) heart failure: Secondary | ICD-10-CM | POA: Diagnosis not present

## 2022-12-07 DIAGNOSIS — R079 Chest pain, unspecified: Secondary | ICD-10-CM | POA: Diagnosis not present

## 2022-12-07 DIAGNOSIS — J9611 Chronic respiratory failure with hypoxia: Secondary | ICD-10-CM | POA: Diagnosis not present

## 2022-12-07 DIAGNOSIS — J441 Chronic obstructive pulmonary disease with (acute) exacerbation: Secondary | ICD-10-CM | POA: Diagnosis not present

## 2022-12-07 DIAGNOSIS — E8770 Fluid overload, unspecified: Secondary | ICD-10-CM | POA: Diagnosis not present

## 2022-12-07 DIAGNOSIS — Z6841 Body Mass Index (BMI) 40.0 and over, adult: Secondary | ICD-10-CM | POA: Diagnosis not present

## 2022-12-07 DIAGNOSIS — J9602 Acute respiratory failure with hypercapnia: Secondary | ICD-10-CM | POA: Diagnosis not present

## 2022-12-07 DIAGNOSIS — J9612 Chronic respiratory failure with hypercapnia: Secondary | ICD-10-CM | POA: Diagnosis not present

## 2022-12-07 LAB — CBC
HCT: 39.3 % (ref 39.0–52.0)
Hemoglobin: 10.3 g/dL — ABNORMAL LOW (ref 13.0–17.0)
MCH: 21.5 pg — ABNORMAL LOW (ref 26.0–34.0)
MCHC: 26.2 g/dL — ABNORMAL LOW (ref 30.0–36.0)
MCV: 82 fL (ref 80.0–100.0)
Platelets: 216 10*3/uL (ref 150–400)
RBC: 4.79 MIL/uL (ref 4.22–5.81)
RDW: 18.1 % — ABNORMAL HIGH (ref 11.5–15.5)
WBC: 8.5 10*3/uL (ref 4.0–10.5)
nRBC: 0.5 % — ABNORMAL HIGH (ref 0.0–0.2)

## 2022-12-07 LAB — BASIC METABOLIC PANEL
Anion gap: 11 (ref 5–15)
BUN: 30 mg/dL — ABNORMAL HIGH (ref 8–23)
CO2: 36 mmol/L — ABNORMAL HIGH (ref 22–32)
Calcium: 8.5 mg/dL — ABNORMAL LOW (ref 8.9–10.3)
Chloride: 93 mmol/L — ABNORMAL LOW (ref 98–111)
Creatinine, Ser: 1.22 mg/dL (ref 0.61–1.24)
GFR, Estimated: >60 mL/min (ref >60)
Glucose, Bld: 106 mg/dL — ABNORMAL HIGH (ref 70–99)
Potassium: 4.4 mmol/L (ref 3.5–5.1)
Sodium: 140 mmol/L (ref 135–145)

## 2022-12-07 LAB — MAGNESIUM: Magnesium: 2.8 mg/dL — ABNORMAL HIGH (ref 1.7–2.4)

## 2022-12-07 MED ORDER — MIDODRINE HCL 5 MG PO TABS
2.5000 mg | ORAL_TABLET | Freq: Two times a day (BID) | ORAL | Status: DC | PRN
  Administered 2022-12-14 – 2022-12-17 (×2): 2.5 mg via ORAL
  Filled 2022-12-07 (×2): qty 1
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
## 2022-12-07 NOTE — Progress Notes (Signed)
PROGRESS NOTE    Gary [name]  PXT:[id number] DOB: Aug 24, 1955 DOA: 11/28/2022 PCP: Shayne Alken, MD  [id number]  LOS: 7 days   Brief hospital course: Gary [name] is a 67 y.o. 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   Gary [name] stated that he ran out of his home torsemide several days ago due to a mixup with his pharmacy.  Then over the last couple days, he has noticed increasing lower extremity swelling, shortness of breath and chest pain.  He states that he usually develops chest pain when he has "fluid on his lungs."    8/21: Clinically improving, IV diuretic discontinued due to worsening creatinine and he will be switched to home torsemide from tomorrow.  Medically stable for discharge but keeps saying that he is still feeling weak and does not want to go home sick.  Refusing to work with PT. original PT recommendations were for home health.  8/22: Medically stable but still not ready to get out of hospital stating that he will be right back if we discharged him.  Want to see an ENT stating that his trach is hurting-ENT was consulted  8/23: Medically stable but still refusing to go home.  Will finish the course of Levaquin.  ENT evaluated him and recommending just complete the course of Levaquin and no other intervention needed.  Assessment & Plan:   * Acute on chronic diastolic CHF (congestive heart failure) ([name]) Patient is presenting with increased work of breathing, shortness of breath and evidence of hypervolemia on examination.  CXR showed underinflation with bilateral areas of scarring or atelectatic change, and vascular congestion. Recent echocardiogram in July 2024 with preserved EF at 55-60% and grade 1 diastolic  dysfunction. Creatinine seems stable today -Home torsemide is resumed today -Patient is medically stable for discharge but showing a lot of resistance.  Chronic respiratory failure with hypoxia and hypercapnia ([name]) Patient has a history of chronic hypoxic and hypercapnic respiratory failure, currently with tracheostomy in place.  He is ventilator dependent at bedtime only and on 6-8 L of supplemental oxygen via trach collar during the daytime.  It seems that patient frequently adjusts his own supplemental oxygen amount, making it difficult to know exactly what he is using at home.  VBG notable for significant hypercapnia, however given normal pH, likely chronic Plan: - Continue supplemental oxygen via trach collar to maintain oxygen saturation above 88%.  Due to hypercapnia, avoid over oxygenation --cont home vent for night-time use  Pseudomonas tracheitis Patient currently symptomatic reporting pain and tenderness with light manipulation of tracheostomy.  He is chronically colonized but reports unusual symptoms. --pulm consulted and rec Levaquin and probiotics Plan: --cont Levaquin-to complete the course today --cont probiotic -ENT consult -no further recommendations except completing the course of Levaquin  Severe COPD (chronic obstructive pulmonary disease) ([name]) Severe COPD on triple bronchodilator.  Current shortness of breath most likely secondary to heart failure, rather than COPD though. --home Pulmicort neb d/c'ed by pulm.  Chronic home prednisone 10 mg daily was reduced to 5 mg daily by pulm. Plan: --cont Brovana --cont prednisone 5 mg daily  --IS and PT/OT daily  Chronic kidney disease, stage 3a ([name]) --Slight increase in creatinine with IV diuresis today --monitor  Hx of Hypotension --BP has been wnl.  On home midodrine 10 mg BID. --cont to taper down midodrine as 2.5 mg BID.  OSA (obstructive sleep apnea) --cont home vent for night-time use  Chronic colitis -  Continue home mesalamine  Morbid obesity with BMI of 50.0-59.9, adult [facility]) Patient has a history of morbid obesity with a BMI of 52, complicating his current medical care.  Chest pain, ACS ruled out Patient presented with complaints of chest pain, likely due to heart failure exacerbation.  No EKG changes and troponin is negative.   DVT prophylaxis: UX:[id number] Code Status: Full code  Family Communication:  Level of care: Progressive Dispo:   The patient is from: home Anticipated d/c is to: home with home health Anticipated d/c date is: 1-2 days.  Now medically stable but very resistant for discharge, likely be discharged tomorrow.   Subjective and Interval History:  Patient was seen and examined today.  Keeps saying that he is not ready to go home.  Objective: Vitals:   12/06/22 2315 12/06/22 2345 12/07/22 0521 12/07/22 1302  BP:  131/88 132/86 129/83  Pulse:  81 86 91  Resp:  20 20   Temp:  98.5 F (36.9 C) 98.4 F (36.9 C) 98.5 F (36.9 C)  TempSrc:      SpO2: 95% 98% 95% (!) 88%  Weight:      Height:        Intake/Output Summary (Last 24 hours) at 12/07/2022 1533 Last data filed at 12/07/2022 1300 Gross per 24 hour  Intake 240 ml  Output 2150 ml  Net -1910 ml   Filed Weights   12/01/22 0500 12/02/22 0434 12/03/22 0457  Weight: (!) 192.2 kg (!) 192.6 kg (!) 192.1 kg    Examination:   General.  Morbidly obese gentleman, in no acute distress. Pulmonary.  Lungs clear bilaterally, normal respiratory effort. CV.  Regular rate and rhythm, no JVD, rub or murmur. Abdomen.  Soft, nontender, nondistended, BS positive. CNS.  Alert and oriented .  No focal neurologic deficit. Extremities.  Trace LE edema, no cyanosis, pulses intact and symmetrical. Psychiatry.  Judgment and insight appears normal.   Data Reviewed: I have personally reviewed labs and imaging studies  Time spent: 42 minutes  This record has been created using Conservation officer, historic buildings. Errors  have been sought and corrected,but may not always be located. Such creation errors do not reflect on the standard of care.   Arnetha Courser, MD [facility] If 7PM-7AM, please contact night-coverage 12/07/2022, 3:33 PM

## 2022-12-07 NOTE — Progress Notes (Signed)
Occupational Therapy Treatment Patient Details Name: Gary [name] MRN: [id number] DOB: February 26, 1956 Today's Date: 12/07/2022   History of present illness 67 y/o male presented to [facility] ED on 11/28/22 for chest pain x 4 days. Admitted for acute on chronic CHF. Frequent admissions this year with most recent dsicharge on 11/21/22. PMH includes obesity, hypoxia, respiratory failure with tracheostomy, COPD, GIB, HFpEF.   OT comments  Gary [name] was seen for OT treatment on this date. Upon arrival to room pt in bed with RN completing care, agreeable to tx. Pt requires SUPERVISION for bed change rolling bed level. Deferred OOB this date stating it was too late in the day. Green theraband provided for supine therex: triceps extensions, overhead press, chest press. Pt making progress toward goals, will continue to follow POC. Discharge recommendation remains appropriate.        If plan is discharge home, recommend the following:  A little help with walking and/or transfers;A lot of help with bathing/dressing/bathroom   Equipment Recommendations  None recommended by OT    Recommendations for Other Services      Precautions / Restrictions Precautions Precautions: Fall Restrictions Weight Bearing Restrictions: No       Mobility Bed Mobility Overal bed mobility: Needs Assistance Bed Mobility: Rolling Rolling: Supervision         General bed mobility comments: defers further mobility    Transfers                   General transfer comment: states it is too late in the day to sit up         ADL either performed or assessed with clinical judgement   ADL Overall ADL's : Needs assistance/impaired                                       General ADL Comments: MIN A perirecare and bed change rolling bed level.       Cognition Arousal: Alert Behavior During Therapy: WFL for tasks assessed/performed Overall Cognitive Status: Within Functional Limits for tasks  assessed                                          Exercises Other Exercises Other Exercises: green theraband provided for supine therex: triceps extensions, overhead press, chest press            Pertinent Vitals/ Pain       Pain Assessment Pain Assessment: No/denies pain   Frequency  Min 1X/week        Progress Toward Goals  OT Goals(current goals can now be found in the care plan section)  Progress towards OT goals: Progressing toward goals  Acute Rehab OT Goals Patient Stated Goal: get stronger OT Goal Formulation: With patient Time For Goal Achievement: 12/14/22 Potential to Achieve Goals: Good ADL Goals Pt Will Perform Grooming: with modified independence;standing Pt Will Transfer to Toilet: with modified independence;ambulating;regular height toilet Pt Will Perform Toileting - Clothing Manipulation and hygiene: with mod assist;sit to/from stand  Plan Discharge plan remains appropriate;Frequency remains appropriate    Co-evaluation                 AM-PAC OT "6 Clicks" Daily Activity     Outcome Measure   Help from another person eating meals?: None Help from  another person taking care of personal grooming?: A Little Help from another person toileting, which includes using toliet, bedpan, or urinal?: A Lot Help from another person bathing (including washing, rinsing, drying)?: A Lot Help from another person to put on and taking off regular upper body clothing?: A Little Help from another person to put on and taking off regular lower body clothing?: A Lot 6 Click Score: 16    End of Session    OT Visit Diagnosis: Unsteadiness on feet (R26.81);Other abnormalities of gait and mobility (R26.89);Muscle weakness (generalized) (M62.81);Adult, failure to thrive (R62.7)   Activity Tolerance Patient tolerated treatment well   Patient Left with call bell/phone within reach;in bed   Nurse Communication Mobility status        Time:  5409-8119 OT Time Calculation (min): 15 min  Charges: OT General Charges $OT Visit: 1 Visit OT Treatments $Therapeutic Exercise: 8-22 mins  Kathie Dike, M.S. OTR/L  12/07/22, 2:50 PM  ascom [phone]

## 2022-12-07 NOTE — Plan of Care (Signed)

## 2022-12-07 NOTE — Plan of Care (Signed)
  Problem: Education: Goal: Knowledge of General Education information will improve Description: Including pain rating scale, medication(s)/side effects and non-pharmacologic comfort measures Outcome: Adequate for Discharge   Problem: Health Behavior/Discharge Planning: Goal: Ability to manage health-related needs will improve Outcome: Adequate for Discharge   Problem: Clinical Measurements: Goal: Ability to maintain clinical measurements within normal limits will improve Outcome: Adequate for Discharge Goal: Will remain free from infection Outcome: Adequate for Discharge Goal: Diagnostic test results will improve Outcome: Adequate for Discharge Goal: Respiratory complications will improve Outcome: Adequate for Discharge Goal: Cardiovascular complication will be avoided Outcome: Adequate for Discharge   

## 2022-12-07 NOTE — Progress Notes (Signed)
PT Cancellation Note  Patient Details Name: Gary [name] MRN: [id number] DOB: 12-24-1955   Cancelled Treatment:     PT attempt. Pt requested PM session. Acute PT will continue to follow per current POC.    [name] 12/07/2022, 10:44 AM

## 2022-12-08 ENCOUNTER — Inpatient Hospital Stay: Payer: Medicare HMO

## 2022-12-08 DIAGNOSIS — R079 Chest pain, unspecified: Secondary | ICD-10-CM | POA: Diagnosis not present

## 2022-12-08 DIAGNOSIS — J9811 Atelectasis: Secondary | ICD-10-CM | POA: Diagnosis not present

## 2022-12-08 DIAGNOSIS — J9601 Acute respiratory failure with hypoxia: Secondary | ICD-10-CM | POA: Diagnosis not present

## 2022-12-08 DIAGNOSIS — R0989 Other specified symptoms and signs involving the circulatory and respiratory systems: Secondary | ICD-10-CM | POA: Diagnosis not present

## 2022-12-08 DIAGNOSIS — J811 Chronic pulmonary edema: Secondary | ICD-10-CM | POA: Diagnosis not present

## 2022-12-08 DIAGNOSIS — I5033 Acute on chronic diastolic (congestive) heart failure: Secondary | ICD-10-CM | POA: Diagnosis not present

## 2022-12-08 DIAGNOSIS — Z6841 Body Mass Index (BMI) 40.0 and over, adult: Secondary | ICD-10-CM | POA: Diagnosis not present

## 2022-12-08 DIAGNOSIS — J9611 Chronic respiratory failure with hypoxia: Secondary | ICD-10-CM | POA: Diagnosis not present

## 2022-12-08 DIAGNOSIS — E8770 Fluid overload, unspecified: Secondary | ICD-10-CM | POA: Diagnosis not present

## 2022-12-08 DIAGNOSIS — I509 Heart failure, unspecified: Secondary | ICD-10-CM | POA: Diagnosis not present

## 2022-12-08 DIAGNOSIS — J441 Chronic obstructive pulmonary disease with (acute) exacerbation: Secondary | ICD-10-CM | POA: Diagnosis not present

## 2022-12-08 DIAGNOSIS — J449 Chronic obstructive pulmonary disease, unspecified: Secondary | ICD-10-CM | POA: Diagnosis not present

## 2022-12-08 DIAGNOSIS — J9602 Acute respiratory failure with hypercapnia: Secondary | ICD-10-CM | POA: Diagnosis not present

## 2022-12-08 DIAGNOSIS — J9612 Chronic respiratory failure with hypercapnia: Secondary | ICD-10-CM | POA: Diagnosis not present

## 2022-12-08 LAB — BASIC METABOLIC PANEL
Anion gap: 7 (ref 5–15)
BUN: 25 mg/dL — ABNORMAL HIGH (ref 8–23)
CO2: 39 mmol/L — ABNORMAL HIGH (ref 22–32)
Calcium: 8.3 mg/dL — ABNORMAL LOW (ref 8.9–10.3)
Chloride: 95 mmol/L — ABNORMAL LOW (ref 98–111)
Creatinine, Ser: 1.15 mg/dL (ref 0.61–1.24)
GFR, Estimated: >60 mL/min (ref >60)
Glucose, Bld: 106 mg/dL — ABNORMAL HIGH (ref 70–99)
Potassium: 4 mmol/L (ref 3.5–5.1)
Sodium: 141 mmol/L (ref 135–145)

## 2022-12-08 LAB — MAGNESIUM: Magnesium: 2.4 mg/dL (ref 1.7–2.4)

## 2022-12-08 LAB — CBC
HCT: 38.9 % — ABNORMAL LOW (ref 39.0–52.0)
Hemoglobin: 10.1 g/dL — ABNORMAL LOW (ref 13.0–17.0)
MCH: 21.4 pg — ABNORMAL LOW (ref 26.0–34.0)
MCHC: 26 g/dL — ABNORMAL LOW (ref 30.0–36.0)
MCV: 82.4 fL (ref 80.0–100.0)
Platelets: 191 10*3/uL (ref 150–400)
RBC: 4.72 MIL/uL (ref 4.22–5.81)
RDW: 18 % — ABNORMAL HIGH (ref 11.5–15.5)
WBC: 7.2 10*3/uL (ref 4.0–10.5)
nRBC: 0.6 % — ABNORMAL HIGH (ref 0.0–0.2)

## 2022-12-08 MED ORDER — TORSEMIDE 20 MG PO TABS
40.0000 mg | ORAL_TABLET | Freq: Two times a day (BID) | ORAL | Status: DC
  Administered 2022-12-08 – 2022-12-09 (×3): 40 mg via ORAL
  Filled 2022-12-08 (×3): qty 2

## 2022-12-08 NOTE — Progress Notes (Signed)
Mobility Specialist - Progress Note   12/08/22 1321  Mobility  Activity Refused mobility   Pt defers mobility to next date. Will attempt again tomorrow.   [name]  Mobility Specialist  12/08/22 1:21 PM

## 2022-12-08 NOTE — Progress Notes (Signed)
Off home home vent. Back to trach collar. Patient tolerated interventions well.

## 2022-12-08 NOTE — Progress Notes (Signed)
PROGRESS NOTE    Gary [name]  MVH:[id number] DOB: [birth year] DOA: 11/28/2022 PCP: Shayne Alken, MD  [id number]  LOS: 8 days   Brief hospital course: Gary [name] is a 67 y.o. 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   Gary [name] stated that he ran out of his home torsemide several days ago due to a mixup with his pharmacy.  Then over the last couple days, he has noticed increasing lower extremity swelling, shortness of breath and chest pain.  He states that he usually develops chest pain when he has "fluid on his lungs."    8/21: Clinically improving, IV diuretic discontinued due to worsening creatinine and he will be switched to home torsemide from tomorrow.  Medically stable for discharge but keeps saying that he is still feeling weak and does not want to go home sick.  Refusing to work with PT. original PT recommendations were for home health.  8/22: Medically stable but still not ready to get out of hospital stating that he will be right back if we discharged him.  Want to see an ENT stating that his trach is hurting-ENT was consulted  8/23: Medically stable but still refusing to go home.  Will finish the course of Levaquin.  ENT evaluated him and recommending just complete the course of Levaquin and no other intervention needed.  8/24: Remained medically stable.  Continue to say that he cannot take care of himself.  Ask TOC to explore options for long-term care.  Assessment & Plan:   * Acute on chronic diastolic CHF (congestive heart failure) ([name]) Patient is presenting with increased work of breathing, shortness of breath and evidence of hypervolemia on examination.  CXR showed underinflation with bilateral areas of scarring or atelectatic  change, and vascular congestion. Recent echocardiogram in July 2024 with preserved EF at 55-60% and grade 1 diastolic dysfunction. Creatinine seems stable today -Home torsemide is resumed today -Patient is medically stable for discharge but showing a lot of resistance.  Chronic respiratory failure with hypoxia and hypercapnia ([name]) Patient has a history of chronic hypoxic and hypercapnic respiratory failure, currently with tracheostomy in place.  He is ventilator dependent at bedtime only and on 6-8 L of supplemental oxygen via trach collar during the daytime.  It seems that patient frequently adjusts his own supplemental oxygen amount, making it difficult to know exactly what he is using at home.  VBG notable for significant hypercapnia, however given normal pH, likely chronic Plan: - Continue supplemental oxygen via trach collar to maintain oxygen saturation above 88%.  Due to hypercapnia, avoid over oxygenation --cont home vent for night-time use  Pseudomonas tracheitis Patient currently symptomatic reporting pain and tenderness with light manipulation of tracheostomy.  He is chronically colonized but reports unusual symptoms. --pulm consulted and rec Levaquin and probiotics Plan: --Completed the course of Levaquin --cont probiotic -ENT consult -no further recommendations except completing the course of Levaquin  Severe COPD (chronic obstructive pulmonary disease) ([name]) Severe COPD on triple bronchodilator.  Current shortness of breath most likely secondary to heart failure, rather than COPD though. --home Pulmicort neb d/c'ed by pulm.  Chronic home prednisone 10 mg daily was reduced to 5 mg daily by pulm. Plan: --cont Brovana --cont prednisone 5 mg daily  --IS and PT/OT daily  Chronic kidney disease, stage 3a ([name]) --Slight increase in creatinine with IV diuresis today --monitor  Hx of Hypotension --BP has been wnl.  On home midodrine 10 mg BID. --cont to taper down midodrine as  2.5 mg BID.  OSA (obstructive sleep apnea) --cont home vent for night-time use  Chronic colitis - Continue home mesalamine  Morbid obesity with BMI of 50.0-59.9, adult [facility]) Patient has a history of morbid obesity with a BMI of 52, complicating his current medical care.  Chest pain, ACS ruled out Patient presented with complaints of chest pain, likely due to heart failure exacerbation.  No EKG changes and troponin is negative.   DVT prophylaxis: [id number] Code Status: Full code  Family Communication:  Level of care: Progressive Dispo:   The patient is from: home Anticipated d/c is to: To be determined, might need LTC Anticipated d/c date is: 1-2 days.  Now medically stable but very resistant for discharge, likely be discharged tomorrow.   Subjective and Interval History:  Patient with no new concern but keeps saying that he cannot go home and take care of himself.  Agrees to see if he qualifies for LTC  Objective: Vitals:   12/08/22 0353 12/08/22 0725 12/08/22 0735 12/08/22 1215  BP: 126/87 (!) 129/92  130/74  Pulse: 84 83  87  Resp: 20 18  18   Temp: 98.5 F (36.9 C) 98.3 F (36.8 C)  98.3 F (36.8 C)  TempSrc:      SpO2: 92% 94% 93% (!) 89%  Weight:      Height:        Intake/Output Summary (Last 24 hours) at 12/08/2022 1300 Last data filed at 12/08/2022 0700 Gross per 24 hour  Intake --  Output 2250 ml  Net -2250 ml   Filed Weights   12/01/22 0500 12/02/22 0434 12/03/22 0457  Weight: (!) 192.2 kg (!) 192.6 kg (!) 192.1 kg    Examination:   General.  Morbidly obese gentleman, in no acute distress.  Trach collar in place Pulmonary.  Lungs clear bilaterally, normal respiratory effort. CV.  Regular rate and rhythm, no JVD, rub or murmur. Abdomen.  Soft, nontender, nondistended, BS positive. CNS.  Alert and oriented .  No focal neurologic deficit. Extremities.  No edema, no cyanosis, pulses intact and symmetrical. Psychiatry.  Judgment and insight appears  normal.   Data Reviewed: I have personally reviewed labs and imaging studies  Time spent: 40 minutes  This record has been created using Conservation officer, historic buildings. Errors have been sought and corrected,but may not always be located. Such creation errors do not reflect on the standard of care.   Arnetha Courser, MD [facility] If 7PM-7AM, please contact night-coverage 12/08/2022, 1:00 PM
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
## 2022-12-09 DIAGNOSIS — E8770 Fluid overload, unspecified: Secondary | ICD-10-CM | POA: Diagnosis not present

## 2022-12-09 DIAGNOSIS — J9611 Chronic respiratory failure with hypoxia: Secondary | ICD-10-CM | POA: Diagnosis not present

## 2022-12-09 DIAGNOSIS — R079 Chest pain, unspecified: Secondary | ICD-10-CM | POA: Diagnosis not present

## 2022-12-09 DIAGNOSIS — Z6841 Body Mass Index (BMI) 40.0 and over, adult: Secondary | ICD-10-CM | POA: Diagnosis not present

## 2022-12-09 DIAGNOSIS — J449 Chronic obstructive pulmonary disease, unspecified: Secondary | ICD-10-CM | POA: Diagnosis not present

## 2022-12-09 DIAGNOSIS — J9612 Chronic respiratory failure with hypercapnia: Secondary | ICD-10-CM | POA: Diagnosis not present

## 2022-12-09 DIAGNOSIS — I5033 Acute on chronic diastolic (congestive) heart failure: Secondary | ICD-10-CM | POA: Diagnosis not present

## 2022-12-09 LAB — CBC
HCT: 39 % (ref 39.0–52.0)
Hemoglobin: 10.1 g/dL — ABNORMAL LOW (ref 13.0–17.0)
MCH: 21.3 pg — ABNORMAL LOW (ref 26.0–34.0)
MCHC: 25.9 g/dL — ABNORMAL LOW (ref 30.0–36.0)
MCV: 82.3 fL (ref 80.0–100.0)
Platelets: 216 10*3/uL (ref 150–400)
RBC: 4.74 MIL/uL (ref 4.22–5.81)
RDW: 17.8 % — ABNORMAL HIGH (ref 11.5–15.5)
WBC: 7.5 10*3/uL (ref 4.0–10.5)
nRBC: 0.4 % — ABNORMAL HIGH (ref 0.0–0.2)

## 2022-12-09 LAB — BASIC METABOLIC PANEL
Anion gap: 6 (ref 5–15)
BUN: 28 mg/dL — ABNORMAL HIGH (ref 8–23)
CO2: 38 mmol/L — ABNORMAL HIGH (ref 22–32)
Calcium: 8 mg/dL — ABNORMAL LOW (ref 8.9–10.3)
Chloride: 93 mmol/L — ABNORMAL LOW (ref 98–111)
Creatinine, Ser: 1.3 mg/dL — ABNORMAL HIGH (ref 0.61–1.24)
GFR, Estimated: >60 mL/min (ref >60)
Glucose, Bld: 119 mg/dL — ABNORMAL HIGH (ref 70–99)
Potassium: 4 mmol/L (ref 3.5–5.1)
Sodium: 137 mmol/L (ref 135–145)

## 2022-12-09 LAB — MAGNESIUM: Magnesium: 2.1 mg/dL (ref 1.7–2.4)

## 2022-12-09 MED ORDER — TORSEMIDE 20 MG PO TABS
40.0000 mg | ORAL_TABLET | Freq: Every day | ORAL | Status: DC
  Administered 2022-12-10 – 2022-12-18 (×9): 40 mg via ORAL
  Filled 2022-12-09 (×9): qty 2

## 2022-12-09 NOTE — TOC Progression Note (Signed)
Transition of Care [facility]) - Progression Note    Patient Details  Name: Gary [name] MRN: [id number] Date of Birth: 05/19/1955  Transition of Care [facility] CM/SW Contact  [name], RN Phone Number: 12/09/2022, 5:31 PM  Clinical Narrative:  8/25: Patient now requesting [facility] placement per provider. Bed search sent to [facility] via Hub at 530 pm.   Gabriel Cirri MSN RN CM  Transitions of Care Department [facility] [phone] Weekends Only      Expected Discharge Plan: Home w Home Health Services Barriers to Discharge: Other (must enter comment) (Attempting to find SNF or LTC options as patient stated he cannot care for himself.)  Expected Discharge Plan and Services                                               Social Determinants of Health (SDOH) Interventions SDOH Screenings   Food Insecurity: No Food Insecurity (12/01/2022)  Housing: Patient Declined (12/01/2022)  Transportation Needs: No Transportation Needs (12/01/2022)  Utilities: Not At Risk (12/01/2022)  Depression (PHQ2-9): Low Risk  (06/07/2021)  Financial Resource Strain: High Risk (11/21/2022)   Received from [facility]  Physical Activity: Insufficiently Active (05/03/2017)  Social Connections: Patient Declined (11/20/2022)   Received from [facility]  Stress: Stress Concern Present (11/20/2022)   Received from [facility]  Tobacco Use: Medium Risk (12/01/2022)    Readmission Risk Interventions    11/05/2022    2:59 PM 11/05/2022   12:16 PM 12/31/2021    8:58 AM  Readmission Risk Prevention Plan  Transportation Screening Complete Complete Complete  Medication Review Oceanographer) Complete Complete Complete  PCP or Specialist appointment within 3-5 days of discharge Complete Complete Complete  HRI or Home Care Consult Complete Complete Complete  SW Recovery Care/Counseling Consult Complete Complete Complete  Palliative Care Screening Complete Complete Complete  Skilled Nursing  Facility Not Applicable Not Applicable Not Applicable

## 2022-12-09 NOTE — Progress Notes (Signed)
Placed patient on home vent for the night.

## 2022-12-09 NOTE — [place] FL2 (Signed)
[place] MEDICAID FL2 LEVEL OF CARE FORM     IDENTIFICATION  Patient Name: Gary [name] Birthdate: 1956/03/17 Sex: male Admission Date (Current Location): 11/28/2022  [place] and [place] Number:  [address] [id number] K Facility and Address:  [facility], [address] [place], [place] [address]      Provider Number: [id number]  Attending Physician Name and Address:  Arnetha Courser, MD  Relative Name and Phone Number:  Dellia Cloud (Sister)  [phone] [facility])    Current Level of Care: Hospital Recommended Level of Care: Nursing Facility, Other (Comment) [facility]) Prior Approval Number:    Date Approved/Denied:   PASRR Number: [id number]  Discharge Plan: Other (Comment) (LTC or LTACH)    Current Diagnoses: Patient Active Problem List   Diagnosis Date Noted   Fluid overload 11/30/2022   Acute on chronic respiratory failure ([name]) 11/02/2022   History of DVT (deep vein thrombosis) 10/27/2022   AKI (acute kidney injury) ([name]) 10/27/2022   Acute on chronic respiratory failure with hypoxia ([name]) 10/17/2022   Chronic colitis 09/12/2022   Segmental colitis associated with diverticulosis ([name]) 09/12/2022   Diverticulosis of colon with hemorrhage 09/10/2022   Lower GI bleed 09/07/2022   Chest pain 08/13/2022   Influenza A 04/11/2022   Hypokalemia 03/06/2022   Chronic obstructive pulmonary disease (COPD) ([name]) 02/03/2022   GERD without esophagitis 02/03/2022   Anxiety and depression 02/03/2022   Hematochezia    Major depressive disorder, recurrent episode, moderate ([name]) 10/22/2021   Pressure injury of skin 09/27/2021   COPD (chronic obstructive pulmonary disease) ([name])    Iron deficiency anemia    Chronic kidney disease, stage 3a ([name]) 07/22/2021   Weakness    (HFpEF) heart failure with preserved ejection fraction ([name])    Chronic respiratory failure with hypoxia and hypercapnia ([name])    Hyperlipidemia 02/19/2021   Generalized osteoarthritis  of multiple sites 10/31/2020   Hyperkalemia 03/04/2020   Marijuana use 01/17/2020   Thrombocytopenia ([name]) 01/17/2020   Positive hepatitis C antibody test 01/17/2020   Osteoarthritis of glenohumeral joints (Bilateral) 01/17/2020   Osteoarthritis of AC (acromioclavicular) joints (Bilateral) 01/17/2020   Polysubstance abuse ([name]) 08/25/2019   Toxic metabolic encephalopathy 08/25/2019   Chronic pain syndrome 07/14/2019   Anticoagulated 07/14/2019   DDD (degenerative disc disease), lumbosacral 07/14/2019   DDD (degenerative disc disease), cervical 07/14/2019   Acute on chronic diastolic CHF (congestive heart failure) ([name]) 02/28/2015   OSA (obstructive sleep apnea) 02/28/2015   Morbid obesity with BMI of 50.0-59.9, adult ([name]) 04/01/2014   Hypotension 04/01/2014    Orientation RESPIRATION BLADDER Height & Weight     Self, Time, Situation, Place  Tracheostomy Continent Weight: (!) 195 kg Height:  6\' 4"  (193 cm)  BEHAVIORAL SYMPTOMS/MOOD NEUROLOGICAL BOWEL NUTRITION STATUS   (Can be instructive about his care.)   Continent Diet, Supplemental (Fluid restrictions, speaking valve for trach.)  AMBULATORY STATUS COMMUNICATION OF NEEDS Skin   Extensive Assist Verbally                         Personal Care Assistance Level of Assistance  Bathing, Feeding, Dressing Bathing Assistance: Maximum assistance Feeding assistance: Limited assistance Dressing Assistance: Maximum assistance     Functional Limitations Info             SPECIAL CARE FACTORS FREQUENCY                       Contractures Contractures Info: Not present  Additional Factors Info  Code Status, Allergies Code Status Info: Full Code/Difficult Airway/Trach Allergies Info: No [place] Allergies.           Current Medications (12/09/2022):  This is the current hospital active medication list Current Facility-Administered Medications  Medication Dose Route Frequency Provider Last Rate Last Admin   0.9  %  sodium chloride infusion  250 mL Intravenous PRN Verdene Lennert, MD       acetaminophen (TYLENOL) tablet 650 mg  650 mg Oral Q4H PRN Verdene Lennert, MD   650 mg at 12/08/22 2125   acidophilus (RISAQUAD) capsule 2 capsule  2 capsule Oral BID Barrie Folk, [facility]   2 capsule at 12/09/22 0911   albuterol (PROVENTIL) (2.5 MG/3ML) 0.083% nebulizer solution 2.5 mg  2.5 mg Nebulization Q4H PRN Vida Rigger, MD   2.5 mg at 12/09/22 1053   apixaban (ELIQUIS) tablet 2.5 mg  2.5 mg Oral BID Verdene Lennert, MD   2.5 mg at 12/09/22 0911   arformoterol (BROVANA) nebulizer solution 15 mcg  15 mcg Nebulization BID Verdene Lennert, MD   15 mcg at 12/09/22 9604   docusate sodium (COLACE) capsule 100 mg  100 mg Oral Daily Verdene Lennert, MD   100 mg at 12/09/22 5409   ferrous sulfate tablet 325 mg  325 mg Oral Q breakfast Verdene Lennert, MD   325 mg at 12/09/22 0911   guaiFENesin (MUCINEX) 12 hr tablet 1,200 mg  1,200 mg Oral BID Verdene Lennert, MD   1,200 mg at 12/09/22 8119   hydrOXYzine (ATARAX) tablet 25 mg  25 mg Oral TID PRN Darlin Priestly, MD   25 mg at 12/09/22 1038   melatonin tablet 2.5 mg  2.5 mg Oral QHS PRN Verdene Lennert, MD   2.5 mg at 12/08/22 2125   Mesalamine (ASACOL) DR capsule 800 mg  800 mg Oral BID Verdene Lennert, MD   800 mg at 12/09/22 0911   midodrine (PROAMATINE) tablet 2.5 mg  2.5 mg Oral BID PRN Arnetha Courser, MD       ondansetron (ZOFRAN) injection 4 mg  4 mg Intravenous Q6H PRN Verdene Lennert, MD       Oral care mouth rinse  15 mL Mouth Rinse 4 times per day Darlin Priestly, MD   15 mL at 12/09/22 0912   Oral care mouth rinse  15 mL Mouth Rinse PRN Darlin Priestly, MD       pantoprazole (PROTONIX) EC tablet 40 mg  40 mg Oral QHS Verdene Lennert, MD   40 mg at 12/08/22 2125   predniSONE (DELTASONE) tablet 5 mg  5 mg Oral Q breakfast Vida Rigger, MD   5 mg at 12/09/22 0911   revefenacin (YUPELRI) nebulizer solution 175 mcg  175 mcg Nebulization Daily Verdene Lennert, MD   175 mcg at  12/09/22 0709   sodium chloride flush (NS) 0.9 % injection 3 mL  3 mL Intravenous Q12H Verdene Lennert, MD   3 mL at 12/09/22 0912   sodium chloride flush (NS) 0.9 % injection 3 mL  3 mL Intravenous PRN Verdene Lennert, MD       [START ON 12/10/2022] torsemide (DEMADEX) tablet 40 mg  40 mg Oral Daily Arnetha Courser, MD         Discharge Medications: Please see discharge summary for a list of discharge medications.  Relevant Imaging Results:  Relevant Lab Results:   Additional Information [id number]  [name], RN

## 2022-12-09 NOTE — Progress Notes (Signed)
Patient pressed his call button. When asked How could we help him, patient threatened to throw self in the floor. I went to the bedside and patient stated the we do not need to ask what he needs when he calls out. He stated we are to automatically come to the bedside. Patient threatening to tell person who he says owns the floor that he is unable to talk when on the ventilator. This is the reason why we are to come to the bedside. Patient in bed with speaking valve on conversating with staff not connected to ventilator at this time.

## 2022-12-09 NOTE — Progress Notes (Signed)
PT refused to go to go on the vent for tonight. PT states that if he is on the vent, he wont be able to talk and he does not want that. RT assessed pt airway and performed trach care. Pt currently on 30% trach collar. No resp distress noted at this time. Will continue to monitor.

## 2022-12-09 NOTE — Plan of Care (Signed)
Patient remains verbally boisterous with staff. No acute changes.   Problem: Education: Goal: Knowledge of General Education information will improve Description: Including pain rating scale, medication(s)/side effects and non-pharmacologic comfort measures Outcome: Not Progressing   Problem: Health Behavior/Discharge Planning: Goal: Ability to manage health-related needs will improve Outcome: Not Progressing   Problem: Clinical Measurements: Goal: Ability to maintain clinical measurements within normal limits will improve Outcome: Not Progressing Goal: Will remain free from infection Outcome: Not Progressing Goal: Diagnostic test results will improve Outcome: Not Progressing Goal: Respiratory complications will improve Outcome: Not Progressing Goal: Cardiovascular complication will be avoided Outcome: Not Progressing   Problem: Activity: Goal: Risk for activity intolerance will decrease Outcome: Not Progressing   Problem: Nutrition: Goal: Adequate nutrition will be maintained Outcome: Not Progressing   Problem: Coping: Goal: Level of anxiety will decrease Outcome: Not Progressing   Problem: Elimination: Goal: Will not experience complications related to bowel motility Outcome: Not Progressing Goal: Will not experience complications related to urinary retention Outcome: Not Progressing   Problem: Pain Managment: Goal: General experience of comfort will improve Outcome: Not Progressing   Problem: Safety: Goal: Ability to remain free from injury will improve Outcome: Not Progressing   Problem: Skin Integrity: Goal: Risk for impaired skin integrity will decrease Outcome: Not Progressing

## 2022-12-09 NOTE — Progress Notes (Signed)
Mobility Specialist - Progress Note   12/09/22 1402  Mobility  Activity Stood at bedside;Dangled on edge of bed;Turned to right side;Turned to left side  Level of Assistance Standby assist, set-up cues, supervision of patient - no hands on  Assistive Device Front wheel walker  Distance Ambulated (ft) 2 ft  Activity Response Tolerated well  Mobility Referral Yes  $Mobility charge 1 Mobility  Mobility Specialist Start Time (ACUTE ONLY) 1306  Mobility Specialist Stop Time (ACUTE ONLY) 1354  Mobility Specialist Time Calculation (min) (ACUTE ONLY) 48 min    Pt semi-supine in bed on Trach Collar. Pt completes bed mobility with HHA and extra time and able to maintain balance at EOB for 3~5 minutes. Pt STS X1 SBA with no LOB noted. Pt returns to bed and able to turn left/right and reposition indep. Pt left in bed with needs in reach.   [name]  Mobility Specialist  12/09/22 2:11 PM

## 2022-12-09 NOTE — Progress Notes (Addendum)
PROGRESS NOTE    Gary [name]  ZOX:[id number] DOB: 05-22-1955 DOA: 11/28/2022 PCP: Shayne Alken, MD  [id number]  LOS: 9 days   Brief hospital course: Gary [name] is a 67 y.o. 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   Gary [name] stated that he ran out of his home torsemide several days ago due to a mixup with his pharmacy.  Then over the last couple days, he has noticed increasing lower extremity swelling, shortness of breath and chest pain.  He states that he usually develops chest pain when he has "fluid on his lungs."    8/21: Clinically improving, IV diuretic discontinued due to worsening creatinine and he will be switched to home torsemide from tomorrow.  Medically stable for discharge but keeps saying that he is still feeling weak and does not want to go home sick.  Refusing to work with PT. original PT recommendations were for home health.  8/22: Medically stable but still not ready to get out of hospital stating that he will be right back if we discharged him.  Want to see an ENT stating that his trach is hurting-ENT was consulted  8/23: Medically stable but still refusing to go home.  Will finish the course of Levaquin.  ENT evaluated him and recommending just complete the course of Levaquin and no other intervention needed.  8/24: Remained medically stable.  Continue to say that he cannot take care of himself.  Ask TOC to explore options for long-term care.  8/25: Remained stable, asking to go to [facility] as he been there before, TOC to explore.  Slight worsening of creatinine after restarting home torsemide yesterday, making it once daily instead of twice daily  Assessment & Plan:   * Acute on chronic diastolic CHF (congestive heart  failure) ([name]) Patient is presenting with increased work of breathing, shortness of breath and evidence of hypervolemia on examination.  CXR showed underinflation with bilateral areas of scarring or atelectatic change, and vascular congestion. Recent echocardiogram in July 2024 with preserved EF at 55-60% and grade 1 diastolic dysfunction. Slight worsening of creatinine after restarting home torsemide yesterday. -Decreasing the dose of torsemide to once daily -Patient is medically stable for discharge but showing a lot of resistance.  Chronic respiratory failure with hypoxia and hypercapnia ([name]) Patient has a history of chronic hypoxic and hypercapnic respiratory failure, currently with tracheostomy in place.  He is ventilator dependent at bedtime only and on 6-8 L of supplemental oxygen via trach collar during the daytime.  It seems that patient frequently adjusts his own supplemental oxygen amount, making it difficult to know exactly what he is using at home.  VBG notable for significant hypercapnia, however given normal pH, likely chronic Plan: - Continue supplemental oxygen via trach collar to maintain oxygen saturation above 88%.  Due to hypercapnia, avoid over oxygenation --cont home vent for night-time use  Pseudomonas tracheitis Patient currently symptomatic reporting pain and tenderness with light manipulation of tracheostomy.  He is chronically colonized but reports unusual symptoms. --pulm consulted and rec Levaquin and probiotics Plan: --Completed the course of Levaquin --cont probiotic -ENT consult -no further recommendations except completing the course of Levaquin  Severe COPD (chronic obstructive pulmonary disease) ([name]) Severe COPD on triple bronchodilator.  Current shortness of breath most likely  secondary to heart failure, rather than COPD though. --home Pulmicort neb d/c'ed by pulm.  Chronic home prednisone 10 mg daily was reduced to 5 mg daily by pulm. Plan: --cont  Brovana --cont prednisone 5 mg daily  --IS and PT/OT daily  Chronic kidney disease, stage 3a ([name]) --Slight increase in creatinine with IV diuresis today --monitor  Hx of Hypotension --BP has been wnl.  On home midodrine 10 mg BID. --cont to taper down midodrine as 2.5 mg BID.  OSA (obstructive sleep apnea) --cont home vent for night-time use  Chronic colitis - Continue home mesalamine  Morbid obesity with BMI of 50.0-59.9, adult [facility]) Patient has a history of morbid obesity with a BMI of 52, complicating his current medical care.  Chest pain, ACS ruled out Patient presented with complaints of chest pain, likely due to heart failure exacerbation.  No EKG changes and troponin is negative.   DVT prophylaxis: UX:[id number] Code Status: Full code  Family Communication:  Level of care: Progressive Dispo:   The patient is from: home Anticipated d/c is to: To be determined, might need LTC Anticipated d/c date is: 1-2 days.  Now medically stable but very resistant for discharge, likely be discharged tomorrow.   Subjective and Interval History:  Patient with no new concern.  Asking to go to [facility] as being there before.  Still do not think that he can take care of himself  Objective: Vitals:   12/09/22 0355 12/09/22 0500 12/09/22 0746 12/09/22 1100  BP: 126/88  127/89 (!) 143/92  Pulse: 89  82 84  Resp: 20  18 20   Temp: 98.6 F (37 C)  97.9 F (36.6 C) 98 F (36.7 C)  TempSrc:   Oral Oral  SpO2: 95%  94% 92%  Weight:  (!) 195 kg    Height:        Intake/Output Summary (Last 24 hours) at 12/09/2022 1345 Last data filed at 12/09/2022 1043 Gross per 24 hour  Intake --  Output 2700 ml  Net -2700 ml   Filed Weights   12/02/22 0434 12/03/22 0457 12/09/22 0500  Weight: (!) 192.6 kg (!) 192.1 kg (!) 195 kg    Examination:   General.  Morbidly obese gentleman, in no acute distress.  Trach collar in place Pulmonary.  Little harsh breath sounds bilaterally, normal  respiratory effort. CV.  Regular rate and rhythm, no JVD, rub or murmur. Abdomen.  Soft, nontender, nondistended, BS positive. CNS.  Alert and oriented .  No focal neurologic deficit. Extremities.  No edema, no cyanosis, pulses intact and symmetrical. Psychiatry.  Judgment and insight appears normal.   Data Reviewed: I have personally reviewed labs and imaging studies  Time spent: 39 minutes  This record has been created using Conservation officer, historic buildings. Errors have been sought and corrected,but may not always be located. Such creation errors do not reflect on the standard of care.   Arnetha Courser, MD [facility] If 7PM-7AM, please contact night-coverage 12/09/2022, 1:45 PM

## 2022-12-10 DIAGNOSIS — J441 Chronic obstructive pulmonary disease with (acute) exacerbation: Secondary | ICD-10-CM | POA: Diagnosis not present

## 2022-12-10 DIAGNOSIS — J9611 Chronic respiratory failure with hypoxia: Secondary | ICD-10-CM | POA: Diagnosis not present

## 2022-12-10 DIAGNOSIS — J9612 Chronic respiratory failure with hypercapnia: Secondary | ICD-10-CM | POA: Diagnosis not present

## 2022-12-10 DIAGNOSIS — J9602 Acute respiratory failure with hypercapnia: Secondary | ICD-10-CM | POA: Diagnosis not present

## 2022-12-10 DIAGNOSIS — Z6841 Body Mass Index (BMI) 40.0 and over, adult: Secondary | ICD-10-CM | POA: Diagnosis not present

## 2022-12-10 DIAGNOSIS — R079 Chest pain, unspecified: Secondary | ICD-10-CM | POA: Diagnosis not present

## 2022-12-10 DIAGNOSIS — J449 Chronic obstructive pulmonary disease, unspecified: Secondary | ICD-10-CM | POA: Diagnosis not present

## 2022-12-10 DIAGNOSIS — J9601 Acute respiratory failure with hypoxia: Secondary | ICD-10-CM | POA: Diagnosis not present

## 2022-12-10 DIAGNOSIS — I5033 Acute on chronic diastolic (congestive) heart failure: Secondary | ICD-10-CM | POA: Diagnosis not present

## 2022-12-10 DIAGNOSIS — E8770 Fluid overload, unspecified: Secondary | ICD-10-CM | POA: Diagnosis not present

## 2022-12-10 LAB — BASIC METABOLIC PANEL
Anion gap: 8 (ref 5–15)
BUN: 27 mg/dL — ABNORMAL HIGH (ref 8–23)
CO2: 36 mmol/L — ABNORMAL HIGH (ref 22–32)
Calcium: 8.2 mg/dL — ABNORMAL LOW (ref 8.9–10.3)
Chloride: 95 mmol/L — ABNORMAL LOW (ref 98–111)
Creatinine, Ser: 1.2 mg/dL (ref 0.61–1.24)
GFR, Estimated: >60 mL/min (ref >60)
Glucose, Bld: 140 mg/dL — ABNORMAL HIGH (ref 70–99)
Potassium: 3.8 mmol/L (ref 3.5–5.1)
Sodium: 139 mmol/L (ref 135–145)

## 2022-12-10 MED ORDER — SPIRONOLACTONE 25 MG PO TABS
50.0000 mg | ORAL_TABLET | Freq: Every day | ORAL | Status: DC
  Administered 2022-12-10 – 2022-12-26 (×17): 50 mg via ORAL
  Filled 2022-12-10 (×17): qty 2

## 2022-12-10 NOTE — TOC Progression Note (Addendum)
Transition of Care [facility]) - Progression Note    Patient Details  Name: Gary [name] MRN: [id number] Date of Birth: 1955/12/17  Transition of [facility]) CM/SW Contact  Margarito Liner, LCSW Phone Number: 12/10/2022, 10:53 AM  Clinical Narrative:  [facility] LTACH liaison is checking patient's revenue codes to see if he qualifies for their program. It is unlikely that insurance will cover SNF since therapy is recommending home health.   1:49 pm: Patient does have the correct revenue codes. [facility] liaison is checking his Medicare days.   3:50 pm: [facility] is checking to see if patient has any qualifying criteria for LTACH.  4:33 pm: Patient does not qualify for LTACH. He is agreeable to seeing if we can find a LTC SNF. If not, he understands that he will have to return home with home health. Patient confirmed his significant other is there 24/7.  Expected Discharge Plan: Home w Home Health Services Barriers to Discharge: Other (must enter comment) (Attempting to find SNF or LTC options as patient stated he cannot care for himself.)  Expected Discharge Plan and Services                                               Social Determinants of Health (SDOH) Interventions SDOH Screenings   Food Insecurity: No Food Insecurity (12/01/2022)  Housing: Patient Declined (12/01/2022)  Transportation Needs: No Transportation Needs (12/01/2022)  Utilities: Not At Risk (12/01/2022)  Depression (PHQ2-9): Low Risk  (06/07/2021)  Financial Resource Strain: High Risk (11/21/2022)   Received from [facility]  Physical Activity: Insufficiently Active (05/03/2017)  Social Connections: Patient Declined (11/20/2022)   Received from [facility]  Stress: Stress Concern Present (11/20/2022)   Received from [facility]  Tobacco Use: Medium Risk (12/01/2022)    Readmission Risk Interventions    11/05/2022    2:59 PM 11/05/2022   12:16 PM 12/31/2021    8:58 AM  Readmission Risk Prevention Plan   Transportation Screening Complete Complete Complete  Medication Review Oceanographer) Complete Complete Complete  PCP or Specialist appointment within 3-5 days of discharge Complete Complete Complete  HRI or Home Care Consult Complete Complete Complete  SW Recovery Care/Counseling Consult Complete Complete Complete  Palliative Care Screening Complete Complete Complete  Skilled Nursing Facility Not Applicable Not Applicable Not Applicable

## 2022-12-10 NOTE — Progress Notes (Addendum)
PROGRESS NOTE    Gary [name]  WUJ:[id number] DOB: 1955/11/23 DOA: 11/28/2022 PCP: Shayne Alken, MD  [id number]  LOS: 10 days   Brief hospital course: Gary [name] is a 67 y.o. 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   [name] stated that he ran out of his home torsemide several days ago due to a mixup with his pharmacy.  Then over the last couple days, he has noticed increasing lower extremity swelling, shortness of breath and chest pain.  He states that he usually develops chest pain when he has "fluid on his lungs."    8/21: Clinically improving, IV diuretic discontinued due to worsening creatinine and he will be switched to home torsemide from tomorrow.  Medically stable for discharge but keeps saying that he is still feeling weak and does not want to go home sick.  Refusing to work with PT. original PT recommendations were for home health.  8/22: Medically stable but still not ready to get out of hospital stating that he will be right back if we discharged him.  Want to see an ENT stating that his trach is hurting-ENT was consulted  8/23: Medically stable but still refusing to go home.  Will finish the course of Levaquin.  ENT evaluated him and recommending just complete the course of Levaquin and no other intervention needed.  8/24: Remained medically stable.  Continue to say that he cannot take care of himself.  Ask TOC to explore options for long-term care.  8/25: Remained stable, asking to go to [facility] as he been there before, TOC to explore.  Slight worsening of creatinine after restarting home torsemide yesterday, making it once daily instead of twice daily.  8/26: Remained stable.  Renal functions improving with just once daily  dose of torsemide, upset that apparently phlebotomy tried to draw blood from his left hand while he was sleeping, he developed mild edema of hand due to failed attempt.  TOC is still working on placement, likely will need long-term care.  Assessment & Plan:   * Acute on chronic diastolic CHF (congestive heart failure) ([name]) Patient is presenting with increased work of breathing, shortness of breath and evidence of hypervolemia on examination.  CXR showed underinflation with bilateral areas of scarring or atelectatic change, and vascular congestion. Recent echocardiogram in July 2024 with preserved EF at 55-60% and grade 1 diastolic dysfunction. Creatinine improved. -Decreasing the dose of torsemide to once daily-continue with once daily -Patient is medically stable for discharge but showing a lot of resistance.  Chronic respiratory failure with hypoxia and hypercapnia ([name]) Patient has a history of chronic hypoxic and hypercapnic respiratory failure, currently with tracheostomy in place.  He is ventilator dependent at bedtime only and on 6-8 L of supplemental oxygen via trach collar during the daytime.  It seems that patient frequently adjusts his own supplemental oxygen amount, making it difficult to know exactly what he is using at home.  VBG notable for significant hypercapnia, however given normal pH, likely chronic Plan: - Continue supplemental oxygen via trach collar to maintain oxygen saturation above 88%.  Due to hypercapnia, avoid over oxygenation --cont home vent for night-time use  Pseudomonas tracheitis Patient currently symptomatic reporting pain and tenderness with light manipulation of tracheostomy.  He is chronically colonized but  reports unusual symptoms. --pulm consulted and rec Levaquin and probiotics Plan: --Completed the course of Levaquin --cont probiotic -ENT consult -no further recommendations except completing the course of Levaquin  Severe COPD (chronic obstructive  pulmonary disease) ([name]) Severe COPD on triple bronchodilator.  Current shortness of breath most likely secondary to heart failure, rather than COPD though. --home Pulmicort neb d/c'ed by pulm.  Chronic home prednisone 10 mg daily was reduced to 5 mg daily by pulm. Plan: --cont Brovana --cont prednisone 5 mg daily  --IS and PT/OT daily  Chronic kidney disease, stage 3a ([name]) --Creatinine improved after decreasing the dose of torsemide --monitor  Hx of Hypotension --BP has been wnl.  On home midodrine 10 mg BID. --cont to taper down midodrine as 2.5 mg BID.  OSA (obstructive sleep apnea) --cont home vent for night-time use  Chronic colitis - Continue home mesalamine  Morbid obesity with BMI of 50.0-59.9, adult [facility]) Patient has a history of morbid obesity with a BMI of 52, complicating his current medical care.  Chest pain, ACS ruled out Patient presented with complaints of chest pain, likely due to heart failure exacerbation.  No EKG changes and troponin is negative.   DVT prophylaxis: ZO:[id number] Code Status: Full code  Family Communication:  Level of care: Progressive Dispo:   The patient is from: home Anticipated d/c is to: To be determined, might need LTC Anticipated d/c date is: 1-2 days.  Now medically stable but very resistant for discharge, likely be discharged tomorrow.   Subjective and Interval History:  Patient was very upset that phlebotomist tried to draw blood while he was sleeping, stating that they should get my consent before putting any needle in.  Still does not think that he is ready to go home.  Objective: Vitals:   12/10/22 0535 12/10/22 0750 12/10/22 0821 12/10/22 1234  BP: 124/78  120/83 127/78  Pulse: 84  80 83  Resp: 18  (!) 24 (!) 23  Temp: 98 F (36.7 C)  97.7 F (36.5 C) 97.9 F (36.6 C)  TempSrc: Oral     SpO2: 92% 94% 95% 91%  Weight:      Height:        Intake/Output Summary (Last 24 hours) at 12/10/2022 1248 Last data filed at  12/10/2022 1100 Gross per 24 hour  Intake --  Output 2550 ml  Net -2550 ml   Filed Weights   12/02/22 0434 12/03/22 0457 12/09/22 0500  Weight: (!) 192.6 kg (!) 192.1 kg (!) 195 kg    Examination:   General.  Morbidly obese gentleman, in no acute distress. Pulmonary.  Lungs clear bilaterally, normal respiratory effort. CV.  Regular rate and rhythm, no JVD, rub or murmur. Abdomen.  Soft, nontender, nondistended, BS positive. CNS.  Alert and oriented .  No focal neurologic deficit. Extremities.  1+ LE edema, no cyanosis, pulses intact and symmetrical. Psychiatry.  Judgment and insight appears normal.   Data Reviewed: I have personally reviewed labs and imaging studies  Time spent: 40 minutes  This record has been created using Conservation officer, historic buildings. Errors have been sought and corrected,but may not always be located. Such creation errors do not reflect on the standard of care.   Arnetha Courser, MD [facility] If 7PM-7AM, please contact night-coverage 12/10/2022, 12:48 PM

## 2022-12-10 NOTE — Progress Notes (Signed)
Placed patient on home vent for the night.
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
## 2022-12-10 NOTE — [place] FL2 (Signed)
[place] MEDICAID FL2 LEVEL OF CARE FORM     IDENTIFICATION  Patient Name: Gary [name] Birthdate: 1955-06-16 Sex: male Admission Date (Current Location): 11/28/2022  [place] and [place] Number:  [address] [id number] K Facility and Address:  [facility], [address] [place], [place] [address]      Provider Number: [id number]  Attending Physician Name and Address:  Arnetha Courser, MD  Relative Name and Phone Number:  Dellia Cloud (Sister)  [phone] [facility])    Current Level of Care: Hospital Recommended Level of Care: Skilled Nursing Facility Prior Approval Number:    Date Approved/Denied:   PASRR Number: [id number]  Discharge Plan: SNF    Current Diagnoses: Patient Active Problem List   Diagnosis Date Noted   Fluid overload 11/30/2022   Acute on chronic respiratory failure ([name]) 11/02/2022   History of DVT (deep vein thrombosis) 10/27/2022   AKI (acute kidney injury) ([name]) 10/27/2022   Acute on chronic respiratory failure with hypoxia ([name]) 10/17/2022   Chronic colitis 09/12/2022   Segmental colitis associated with diverticulosis ([name]) 09/12/2022   Diverticulosis of colon with hemorrhage 09/10/2022   Lower GI bleed 09/07/2022   Chest pain 08/13/2022   Influenza A 04/11/2022   Hypokalemia 03/06/2022   Chronic obstructive pulmonary disease (COPD) ([name]) 02/03/2022   GERD without esophagitis 02/03/2022   Anxiety and depression 02/03/2022   Hematochezia    Major depressive disorder, recurrent episode, moderate ([name]) 10/22/2021   Pressure injury of skin 09/27/2021   COPD (chronic obstructive pulmonary disease) ([name])    Iron deficiency anemia    Chronic kidney disease, stage 3a ([name]) 07/22/2021   Weakness    (HFpEF) heart failure with preserved ejection fraction ([name])    Chronic respiratory failure with hypoxia and hypercapnia ([name])    Hyperlipidemia 02/19/2021   Generalized osteoarthritis of multiple sites 10/31/2020   Hyperkalemia  03/04/2020   Marijuana use 01/17/2020   Thrombocytopenia ([name]) 01/17/2020   Positive hepatitis C antibody test 01/17/2020   Osteoarthritis of glenohumeral joints (Bilateral) 01/17/2020   Osteoarthritis of AC (acromioclavicular) joints (Bilateral) 01/17/2020   Polysubstance abuse ([name]) 08/25/2019   Toxic metabolic encephalopathy 08/25/2019   Chronic pain syndrome 07/14/2019   Anticoagulated 07/14/2019   DDD (degenerative disc disease), lumbosacral 07/14/2019   DDD (degenerative disc disease), cervical 07/14/2019   Acute on chronic diastolic CHF (congestive heart failure) ([name]) 02/28/2015   OSA (obstructive sleep apnea) 02/28/2015   Morbid obesity with BMI of 50.0-59.9, adult ([name]) 04/01/2014   Hypotension 04/01/2014    Orientation RESPIRATION BLADDER Height & Weight     Self, Time, Situation, Place  Tracheostomy, Other (Comment) (10 L right now. On 6-8 L at home. Has home NIV so would either have to bring this machine or order bipap.) Incontinent, External catheter Weight: (!) 429 lb 14.4 oz (195 kg) Height:  6\' 4"  (193 cm)  BEHAVIORAL SYMPTOMS/MOOD NEUROLOGICAL BOWEL NUTRITION STATUS   (None)  (None) Continent Diet (Heart healthy. Fluid restriction: 1500 mL.)  AMBULATORY STATUS COMMUNICATION OF NEEDS Skin   Limited Assist Verbally Skin abrasions, Bruising, PU Stage and Appropriate Care   PU Stage 2 Dressing: No Dressing (Left buttocks)                   Personal Care Assistance Level of Assistance  Bathing, Feeding, Dressing Bathing Assistance: Limited assistance Feeding assistance: Limited assistance Dressing Assistance: Limited assistance     Functional Limitations Info  Sight, Hearing, Speech Sight Info: Adequate Hearing Info: Adequate Speech Info: Adequate  SPECIAL CARE FACTORS FREQUENCY  PT (By licensed PT), OT (By licensed OT)     PT Frequency: 3 x week OT Frequency: 3 x week            Contractures Contractures Info: Not present    Additional  Factors Info  Code Status, Allergies Code Status Info: Full code Allergies Info: NKDA           Current Medications (12/10/2022):  This is the current hospital active medication list Current Facility-Administered Medications  Medication Dose Route Frequency Provider Last Rate Last Admin   0.9 %  sodium chloride infusion  250 mL Intravenous PRN Verdene Lennert, MD       acetaminophen (TYLENOL) tablet 650 mg  650 mg Oral Q4H PRN Verdene Lennert, MD   650 mg at 12/10/22 0919   acidophilus (RISAQUAD) capsule 2 capsule  2 capsule Oral BID Barrie Folk, [facility]   2 capsule at 12/10/22 0859   albuterol (PROVENTIL) (2.5 MG/3ML) 0.083% nebulizer solution 2.5 mg  2.5 mg Nebulization Q4H PRN Vida Rigger, MD   2.5 mg at 12/09/22 1053   apixaban (ELIQUIS) tablet 2.5 mg  2.5 mg Oral BID Verdene Lennert, MD   2.5 mg at 12/10/22 0900   arformoterol (BROVANA) nebulizer solution 15 mcg  15 mcg Nebulization BID Verdene Lennert, MD   15 mcg at 12/10/22 0746   docusate sodium (COLACE) capsule 100 mg  100 mg Oral Daily Verdene Lennert, MD   100 mg at 12/10/22 1610   ferrous sulfate tablet 325 mg  325 mg Oral Q breakfast Verdene Lennert, MD   325 mg at 12/10/22 0900   guaiFENesin (MUCINEX) 12 hr tablet 1,200 mg  1,200 mg Oral BID Verdene Lennert, MD   1,200 mg at 12/10/22 0859   hydrOXYzine (ATARAX) tablet 25 mg  25 mg Oral TID PRN Darlin Priestly, MD   25 mg at 12/10/22 0859   melatonin tablet 2.5 mg  2.5 mg Oral QHS PRN Verdene Lennert, MD   2.5 mg at 12/08/22 2125   Mesalamine (ASACOL) DR capsule 800 mg  800 mg Oral BID Verdene Lennert, MD   800 mg at 12/10/22 0859   midodrine (PROAMATINE) tablet 2.5 mg  2.5 mg Oral BID PRN Arnetha Courser, MD       ondansetron (ZOFRAN) injection 4 mg  4 mg Intravenous Q6H PRN Verdene Lennert, MD       Oral care mouth rinse  15 mL Mouth Rinse 4 times per day Darlin Priestly, MD   15 mL at 12/10/22 0901   Oral care mouth rinse  15 mL Mouth Rinse PRN Darlin Priestly, MD       pantoprazole  (PROTONIX) EC tablet 40 mg  40 mg Oral QHS Verdene Lennert, MD   40 mg at 12/09/22 2236   predniSONE (DELTASONE) tablet 5 mg  5 mg Oral Q breakfast Vida Rigger, MD   5 mg at 12/10/22 0859   revefenacin (YUPELRI) nebulizer solution 175 mcg  175 mcg Nebulization Daily Verdene Lennert, MD   175 mcg at 12/10/22 0745   sodium chloride flush (NS) 0.9 % injection 3 mL  3 mL Intravenous Q12H Verdene Lennert, MD   3 mL at 12/09/22 0912   sodium chloride flush (NS) 0.9 % injection 3 mL  3 mL Intravenous PRN Verdene Lennert, MD       spironolactone (ALDACTONE) tablet 50 mg  50 mg Oral Daily Vida Rigger, MD   50 mg at 12/10/22 [phone]  torsemide (DEMADEX) tablet 40 mg  40 mg Oral Daily Arnetha Courser, MD   40 mg at 12/10/22 1610     Discharge Medications: Please see discharge summary for a list of discharge medications.  Relevant Imaging Results:  Relevant Lab Results:   Additional Information SS#: [id number]. Looking for LTC placement. He has Medicaid. Trach placed in June 2023.  Margarito Liner, LCSW

## 2022-12-10 NOTE — Progress Notes (Signed)
Physical Therapy Treatment Patient Details Name: Gary [name] MRN: [id number] DOB: March 01, 1956 Today's Date: 12/10/2022   History of Present Illness 67 y/o male presented to [facility] ED on 11/28/22 for chest pain x 4 days. Admitted for acute on chronic CHF. Frequent admissions this year with most recent dsicharge on 11/21/22. PMH includes obesity, hypoxia, respiratory failure with tracheostomy, COPD, GIB, HFpEF.    PT Comments  Upon entry pt's eyes closed; Pt was easily roused. Pt willing to get out of bed initially, but quickly changed his mind and declined any mobility aside from bed exercises despite encouragement and education on benefits of mobility. Pt able to perform various bed exercises per below, with a focus on slow and controlled movements throughout each exercise. Pt will benefit from continued PT services upon discharge to safely address deficits listed in patient problem list for decreased caregiver assistance and eventual return to PLOF.    If plan is discharge home, recommend the following: A little help with walking and/or transfers;A lot of help with bathing/dressing/bathroom;Assist for transportation   Can travel by private vehicle     No  Equipment Recommendations  Other (comment) (pt has recieve equipment in previous admissions)    Recommendations for Other Services       Precautions / Restrictions Precautions Precautions: Fall Precaution Comments: trach collar/ Trach Restrictions Weight Bearing Restrictions: No     Mobility  Bed Mobility               General bed mobility comments: Declined bed mobility today, agreed to below supine therex only    Transfers                        Ambulation/Gait                   Stairs             Wheelchair Mobility     Tilt Bed    Modified Rankin (Stroke Patients Only)       Balance                                            Cognition Arousal:  Alert Behavior During Therapy: WFL for tasks assessed/performed Overall Cognitive Status: Within Functional Limits for tasks assessed                                          Exercises Total Joint Exercises Ankle Circles/Pumps: AROM, Strengthening, 10 reps, Both Quad Sets: Both, 10 reps, AROM, Strengthening Gluteal Sets: AROM, Strengthening, Both, 10 reps Heel Slides: AROM, Both, Strengthening, 10 reps Hip ABduction/ADduction: AROM, Strengthening, Both, 10 reps Straight Leg Raises: 5 reps, Both, AROM, Strengthening Other Exercises Other Exercises: Pt opted to participate in various bed exercises today, focusing on slow and controlled movements.    General Comments        Pertinent Vitals/Pain Pain Assessment Faces Pain Scale: No hurt    Home Living                          Prior Function            PT Goals (current goals can now be found in the care plan section) Progress  towards PT goals: Not progressing toward goals - comment (self limiting)    Frequency    Min 1X/week      PT Plan      Co-evaluation              AM-PAC PT "6 Clicks" Mobility   Outcome Measure  Help needed turning from your back to your side while in a flat bed without using bedrails?: A Little Help needed moving from lying on your back to sitting on the side of a flat bed without using bedrails?: A Little Help needed moving to and from a bed to a chair (including a wheelchair)?: A Little Help needed standing up from a chair using your arms (e.g., wheelchair or bedside chair)?: A Little Help needed to walk in hospital room?: A Little Help needed climbing 3-5 steps with a railing? : Total 6 Click Score: 16    End of Session Equipment Utilized During Treatment: Oxygen (4 L mobile trach collar) Activity Tolerance: Patient tolerated treatment well Patient left: with bed alarm set;in bed;with call bell/phone within reach Nurse Communication: Mobility  status PT Visit Diagnosis: Muscle weakness (generalized) (M62.81);Difficulty in walking, not elsewhere classified (R26.2)     Time: 1350-1405 PT Time Calculation (min) (ACUTE ONLY): 15 min  Charges:                            Cecile Sheerer, SPT 12/10/22, 5:14 PM

## 2022-12-11 DIAGNOSIS — J9601 Acute respiratory failure with hypoxia: Secondary | ICD-10-CM | POA: Diagnosis not present

## 2022-12-11 DIAGNOSIS — J9611 Chronic respiratory failure with hypoxia: Secondary | ICD-10-CM | POA: Diagnosis not present

## 2022-12-11 DIAGNOSIS — E8770 Fluid overload, unspecified: Secondary | ICD-10-CM | POA: Diagnosis not present

## 2022-12-11 DIAGNOSIS — R079 Chest pain, unspecified: Secondary | ICD-10-CM | POA: Diagnosis not present

## 2022-12-11 DIAGNOSIS — Z6841 Body Mass Index (BMI) 40.0 and over, adult: Secondary | ICD-10-CM | POA: Diagnosis not present

## 2022-12-11 DIAGNOSIS — J449 Chronic obstructive pulmonary disease, unspecified: Secondary | ICD-10-CM | POA: Diagnosis not present

## 2022-12-11 DIAGNOSIS — J9602 Acute respiratory failure with hypercapnia: Secondary | ICD-10-CM | POA: Diagnosis not present

## 2022-12-11 DIAGNOSIS — J441 Chronic obstructive pulmonary disease with (acute) exacerbation: Secondary | ICD-10-CM | POA: Diagnosis not present

## 2022-12-11 DIAGNOSIS — I5033 Acute on chronic diastolic (congestive) heart failure: Secondary | ICD-10-CM | POA: Diagnosis not present

## 2022-12-11 DIAGNOSIS — J9612 Chronic respiratory failure with hypercapnia: Secondary | ICD-10-CM | POA: Diagnosis not present
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
## 2022-12-11 NOTE — Plan of Care (Signed)
  Problem: Nutrition: Goal: Adequate nutrition will be maintained Outcome: Progressing   Problem: Coping: Goal: Level of anxiety will decrease Outcome: Progressing   Problem: Elimination: Goal: Will not experience complications related to bowel motility Outcome: Progressing   Problem: Elimination: Goal: Will not experience complications related to urinary retention Outcome: Progressing   Problem: Pain Managment: Goal: General experience of comfort will improve Outcome: Progressing   Problem: Safety: Goal: Ability to remain free from injury will improve Outcome: Progressing   Problem: Skin Integrity: Goal: Risk for impaired skin integrity will decrease Outcome: Progressing   

## 2022-12-11 NOTE — TOC Progression Note (Addendum)
Transition of Care [facility] [place]) - Progression Note    Patient Details  Name: Gary [name] MRN: [id number] Date of Birth: 1955-12-11  Transition of Care [facility]) CM/SW Contact  Margarito Liner, LCSW Phone Number: 12/11/2022, 9:51 AM  Clinical Narrative:  No bed offers this morning. CSW sent updated therapy note to facilities that have not responded yet. CSW spoke to an [facility] SNF liaison. She has a trach group that she sends trach referrals to. She stated that [facility] and [facility] in [place] accept trachs. She will review referral and follow up with decision.   1:44 pm: [facility] liaison sent patient's referral to trach SNF's throughout [place]. There is a facility in [place] that is considering him. [facility] accepts trachs. CSW asked admissions coordinator to review referral.  4:28 pm: [facility] declined. Updated patient regarding the facility in [place].  Expected Discharge Plan: Home w Home Health Services Barriers to Discharge: Other (must enter comment) (Attempting to find SNF or LTC options as patient stated he cannot care for himself.)  Expected Discharge Plan and Services                                               Social Determinants of Health (SDOH) Interventions SDOH Screenings   Food Insecurity: No Food Insecurity (12/01/2022)  Housing: Patient Declined (12/01/2022)  Transportation Needs: No Transportation Needs (12/01/2022)  Utilities: Not At Risk (12/01/2022)  Depression (PHQ2-9): Low Risk  (06/07/2021)  Financial Resource Strain: High Risk (11/21/2022)   Received from [facility]  Physical Activity: Insufficiently Active (05/03/2017)  Social Connections: Patient Declined (11/20/2022)   Received from [facility]  Stress: Stress Concern Present (11/20/2022)   Received from [facility]  Tobacco Use: Medium Risk (12/01/2022)    Readmission Risk Interventions    11/05/2022    2:59 PM 11/05/2022   12:16 PM 12/31/2021    8:58 AM   Readmission Risk Prevention Plan  Transportation Screening Complete Complete Complete  Medication Review Oceanographer) Complete Complete Complete  PCP or Specialist appointment within 3-5 days of discharge Complete Complete Complete  HRI or Home Care Consult Complete Complete Complete  SW Recovery Care/Counseling Consult Complete Complete Complete  Palliative Care Screening Complete Complete Complete  Skilled Nursing Facility Not Applicable Not Applicable Not Applicable

## 2022-12-11 NOTE — Plan of Care (Signed)

## 2022-12-11 NOTE — Progress Notes (Signed)
Placed patient on home vent for the night.

## 2022-12-11 NOTE — Progress Notes (Signed)
PROGRESS NOTE    Gary [name]  UUV:[id number] DOB: 1956/01/19 DOA: 11/28/2022 PCP: Shayne Alken, MD  [id number]  LOS: 11 days   Brief hospital course: Gary [name] is a 67 y.o. 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   Gary [name] stated that he ran out of his home torsemide several days ago due to a mixup with his pharmacy.  Then over the last couple days, he has noticed increasing lower extremity swelling, shortness of breath and chest pain.  He states that he usually develops chest pain when he has "fluid on his lungs."    8/21: Clinically improving, IV diuretic discontinued due to worsening creatinine and he will be switched to home torsemide from tomorrow.  Medically stable for discharge but keeps saying that he is still feeling weak and does not want to go home sick.  Refusing to work with PT. original PT recommendations were for home health.  8/22: Medically stable but still not ready to get out of hospital stating that he will be right back if we discharged him.  Want to see an ENT stating that his trach is hurting-ENT was consulted  8/23: Medically stable but still refusing to go home.  Will finish the course of Levaquin.  ENT evaluated him and recommending just complete the course of Levaquin and no other intervention needed.  8/24: Remained medically stable.  Continue to say that he cannot take care of himself.  Ask TOC to explore options for long-term care.  8/25: Remained stable, asking to go to [facility] as he been there before, TOC to explore.  Slight worsening of creatinine after restarting home torsemide yesterday, making it once daily instead of twice daily.  8/26: Remained stable.  Renal functions improving with just once daily  dose of torsemide, upset that apparently phlebotomy tried to draw blood from his left hand while he was sleeping, he developed mild edema of hand due to failed attempt.  TOC is still working on placement, likely will need long-term care.  8/27: Medical stable, awaiting disposition as keeps saying that he cannot take care of himself.  Assessment & Plan:   * Acute on chronic diastolic CHF (congestive heart failure) ([name]) Patient is presenting with increased work of breathing, shortness of breath and evidence of hypervolemia on examination.  CXR showed underinflation with bilateral areas of scarring or atelectatic change, and vascular congestion. Recent echocardiogram in July 2024 with preserved EF at 55-60% and grade 1 diastolic dysfunction. Creatinine improved. -Decreasing the dose of torsemide to once daily-continue with once daily -Patient is medically stable for discharge but showing a lot of resistance.  Chronic respiratory failure with hypoxia and hypercapnia ([name]) Patient has a history of chronic hypoxic and hypercapnic respiratory failure, currently with tracheostomy in place.  He is ventilator dependent at bedtime only and on 6-8 L of supplemental oxygen via trach collar during the daytime.  It seems that patient frequently adjusts his own supplemental oxygen amount, making it difficult to know exactly what he is using at home.  VBG notable for significant hypercapnia, however given normal pH, likely chronic Plan: - Continue supplemental oxygen via trach collar to maintain oxygen saturation above 88%.  Due to hypercapnia, avoid over oxygenation --cont home vent for night-time use  Pseudomonas tracheitis Patient currently  symptomatic reporting pain and tenderness with light manipulation of tracheostomy.  He is chronically colonized but reports unusual symptoms. --pulm consulted and rec Levaquin and probiotics Plan: --Completed the course of Levaquin --cont probiotic -ENT consult -no  further recommendations except completing the course of Levaquin  Severe COPD (chronic obstructive pulmonary disease) ([name]) Severe COPD on triple bronchodilator.  Current shortness of breath most likely secondary to heart failure, rather than COPD though. --home Pulmicort neb d/c'ed by pulm.  Chronic home prednisone 10 mg daily was reduced to 5 mg daily by pulm. Plan: --cont Brovana --cont prednisone 5 mg daily  --IS and PT/OT daily  Chronic kidney disease, stage 3a ([name]) --Creatinine improved after decreasing the dose of torsemide --monitor  Hx of Hypotension --BP has been wnl.  On home midodrine 10 mg BID. --cont to taper down midodrine as 2.5 mg BID.  OSA (obstructive sleep apnea) --cont home vent for night-time use  Chronic colitis - Continue home mesalamine  Morbid obesity with BMI of 50.0-59.9, adult [facility]) Patient has a history of morbid obesity with a BMI of 52, complicating his current medical care.  Chest pain, ACS ruled out Patient presented with complaints of chest pain, likely due to heart failure exacerbation.  No EKG changes and troponin is negative.   DVT prophylaxis: WG:[id number] Code Status: Full code  Family Communication:  Level of care: Progressive Dispo:   The patient is from: home Anticipated d/c is to: To be determined, might need LTC Anticipated d/c date is: 1-2 days.  Now medically stable but very resistant for discharge, likely be discharged tomorrow.   Subjective and Interval History:  Patient was resting comfortably when seen today.  No new concern but keeps saying that he is not well enough to go back home.  Objective: Vitals:   12/11/22 0019 12/11/22 0412 12/11/22 0716 12/11/22 0901  BP: (!) 145/78 133/87  (!) 123/91  Pulse: 87 85    Resp: 20 18  18   Temp: 98.4 F (36.9 C) 98.9 F (37.2 C)  98.2 F (36.8 C)  TempSrc: Oral Oral  Oral  SpO2: 90% 91% 92% 95%  Weight:      Height:        Intake/Output Summary (Last 24 hours) at  12/11/2022 1253 Last data filed at 12/11/2022 0630 Gross per 24 hour  Intake 1080 ml  Output 1400 ml  Net -320 ml   Filed Weights   12/02/22 0434 12/03/22 0457 12/09/22 0500  Weight: (!) 192.6 kg (!) 192.1 kg (!) 195 kg    Examination:   General.  Morbidly obese gentleman, in no acute distress. Pulmonary.  Lungs clear bilaterally, normal respiratory effort. CV.  Regular rate and rhythm, no JVD, rub or murmur. Abdomen.  Soft, nontender, nondistended, BS positive. CNS.  Alert and oriented .  No focal neurologic deficit. Extremities.  1+ LE edema, no cyanosis, pulses intact and symmetrical. Psychiatry.  Judgment and insight appears normal.   Data Reviewed: I have personally reviewed labs and imaging studies  Time spent: 38 minutes  This record has been created using Conservation officer, historic buildings. Errors have been sought and corrected,but may not always be located. Such creation errors do not reflect on the standard of care.   Arnetha Courser, MD [facility] If 7PM-7AM, please contact night-coverage 12/11/2022, 12:53 PM

## 2022-12-11 NOTE — Progress Notes (Signed)
PT Cancellation Note  Patient Details Name: Gary [name] MRN: [id number] DOB: 1955/08/23   Cancelled Treatment:    Reason Eval/Treat Not Completed: Other (comment): Chart Reviewed, Treatment Attempted. Pt refused at this time, "It is too late in the afternoon". Requested PT return tomorrow. Will re-attempt at later date/time.    Creed Copper Fairly, PT, DPT 12/11/22 3:06 PM

## 2022-12-12 DIAGNOSIS — J9601 Acute respiratory failure with hypoxia: Secondary | ICD-10-CM | POA: Diagnosis not present

## 2022-12-12 DIAGNOSIS — J9602 Acute respiratory failure with hypercapnia: Secondary | ICD-10-CM | POA: Diagnosis not present

## 2022-12-12 DIAGNOSIS — I5033 Acute on chronic diastolic (congestive) heart failure: Secondary | ICD-10-CM | POA: Diagnosis not present

## 2022-12-12 DIAGNOSIS — J9611 Chronic respiratory failure with hypoxia: Secondary | ICD-10-CM | POA: Diagnosis not present

## 2022-12-12 DIAGNOSIS — J449 Chronic obstructive pulmonary disease, unspecified: Secondary | ICD-10-CM | POA: Diagnosis not present

## 2022-12-12 DIAGNOSIS — E8770 Fluid overload, unspecified: Secondary | ICD-10-CM | POA: Diagnosis not present

## 2022-12-12 DIAGNOSIS — Z6841 Body Mass Index (BMI) 40.0 and over, adult: Secondary | ICD-10-CM | POA: Diagnosis not present

## 2022-12-12 DIAGNOSIS — J9612 Chronic respiratory failure with hypercapnia: Secondary | ICD-10-CM | POA: Diagnosis not present

## 2022-12-12 DIAGNOSIS — J441 Chronic obstructive pulmonary disease with (acute) exacerbation: Secondary | ICD-10-CM | POA: Diagnosis not present

## 2022-12-12 DIAGNOSIS — R079 Chest pain, unspecified: Secondary | ICD-10-CM | POA: Diagnosis not present

## 2022-12-12 NOTE — TOC Progression Note (Addendum)
Transition of Care [facility]) - Progression Note    Patient Details  Name: Gary [name] MRN: [id number] Date of Birth: [birth year]  Transition of Care [facility]) CM/SW Contact  Margarito Liner, LCSW Phone Number: 12/12/2022, 11:24 AM  Clinical Narrative:  Patient went to [facility] in [place] in April. CSW called admissions coordinator but she said they do not have any LTC beds.   2:15 pm: [facility] declined. CSW is looking online for other trach SNF's. Left voicemails at [facility], [facility], and [facility] and [place]. [facility] does not accept long-term trachs.  4:16 pm: [facility] in [place], [place] has LTC trach beds. CSW faxed referral to admissions coordinator for review.  Expected Discharge Plan: Skilled Nursing Facility Barriers to Discharge: Other (must enter comment) (Attempting to find SNF or LTC options as patient stated he cannot care for himself.)  Expected Discharge Plan and Services                                               Social Determinants of Health (SDOH) Interventions SDOH Screenings   Food Insecurity: No Food Insecurity (12/01/2022)  Housing: Patient Declined (12/01/2022)  Transportation Needs: No Transportation Needs (12/01/2022)  Utilities: Not At Risk (12/01/2022)  Depression (PHQ2-9): Low Risk  (06/07/2021)  Financial Resource Strain: High Risk (11/21/2022)   Received from [facility]  Physical Activity: Insufficiently Active (05/03/2017)  Social Connections: Patient Declined (11/20/2022)   Received from [facility]  Stress: Stress Concern Present (11/20/2022)   Received from [facility]  Tobacco Use: Medium Risk (12/01/2022)    Readmission Risk Interventions    11/05/2022    2:59 PM 11/05/2022   12:16 PM 12/31/2021    8:58 AM  Readmission Risk Prevention Plan  Transportation Screening Complete Complete Complete  Medication Review Oceanographer) Complete Complete  Complete  PCP or Specialist appointment within 3-5 days of discharge Complete Complete Complete  HRI or Home Care Consult Complete Complete Complete  SW Recovery Care/Counseling Consult Complete Complete Complete  Palliative Care Screening Complete Complete Complete  Skilled Nursing Facility Not Applicable Not Applicable Not Applicable

## 2022-12-12 NOTE — Progress Notes (Signed)
PT Cancellation Note  Patient Details Name: Gary [name] MRN: [id number] DOB: 1955-07-24   Cancelled Treatment:    Reason Eval/Treat Not Completed: Patient declined to participate with PT services this date.  Pt stated that he could not participate secondary to being in the chair.  Educated pt that PT could come back later in the day and work with pt once he was in the bed.  Pt stated he wanted PT to assist him back to bed with the hoyer lift.  Educated pt that nursing could assist him back to bed with hoyer and then PT could focus on utilizing treatment time for skilled interventions that would assist with pt reaching functional goals.  Pt then got upset and stated that he did not want to work with PT at all this date.  Will attempt to see pt at a future date/time as medically appropriate.    Ovidio Hanger PT, DPT 12/12/22, 2:39 PM

## 2022-12-12 NOTE — Plan of Care (Signed)
  Problem: Clinical Measurements: Goal: Ability to maintain clinical measurements within normal limits will improve Outcome: Progressing Goal: Will remain free from infection Outcome: Progressing Goal: Diagnostic test results will improve Outcome: Progressing Goal: Respiratory complications will improve Outcome: Progressing Goal: Cardiovascular complication will be avoided Outcome: Progressing   Problem: Activity: Goal: Risk for activity intolerance will decrease Outcome: Progressing   Problem: Nutrition: Goal: Adequate nutrition will be maintained Outcome: Progressing   Problem: Coping: Goal: Level of anxiety will decrease Outcome: Progressing   Problem: Elimination: Goal: Will not experience complications related to bowel motility Outcome: Progressing Goal: Will not experience complications related to urinary retention Outcome: Progressing   Problem: Safety: Goal: Ability to remain free from injury will improve Outcome: Progressing   Problem: Skin Integrity: Goal: Risk for impaired skin integrity will decrease Outcome: Progressing   

## 2022-12-12 NOTE — Progress Notes (Signed)
Took patient off home vent and placed back on 30% trach collar for the day.

## 2022-12-12 NOTE — Progress Notes (Signed)
PROGRESS NOTE    Gary [name]  SWN:[id number] DOB: [birth year] DOA: 11/28/2022 PCP: Shayne Alken, MD  [id number]  LOS: 12 days   Brief hospital course: Gary [name] is a 67 y.o. 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   Gary [name] stated that Gary [name] ran out of his home torsemide several days ago due to a mixup with his pharmacy.  Then over the last couple days, Gary [name] has noticed increasing lower extremity swelling, shortness of breath and chest pain.  Gary [name] states that Gary [name] usually develops chest pain when Gary [name] has "fluid on his lungs."    8/21: Clinically improving, IV diuretic discontinued due to worsening creatinine and Gary [name] will be switched to home torsemide from tomorrow.  Medically stable for discharge but keeps saying that Gary [name] is still feeling weak and does not want to go home sick.  Refusing to work with PT. original PT recommendations were for home health.  8/22: Medically stable but still not ready to get out of hospital stating that Gary [name] will be right back if we discharged him.  Want to see an ENT stating that his trach is hurting-ENT was consulted  8/23: Medically stable but still refusing to go home.  Will finish the course of Levaquin.  ENT evaluated him and recommending just complete the course of Levaquin and no other intervention needed.  8/24: Remained medically stable.  Continue to say that Gary [name] cannot take care of himself.  Ask TOC to explore options for long-term care.  8/25: Remained stable, asking to go to [facility] as Gary [name] been there before, TOC to explore.  Slight worsening of creatinine after restarting home torsemide yesterday, making it once daily instead of twice daily.  8/26: Remained stable.  Renal functions improving with just once daily  dose of torsemide, upset that apparently phlebotomy tried to draw blood from his left hand while Gary [name] was sleeping, Gary [name] developed mild edema of hand due to failed attempt.  TOC is still working on placement, likely will need long-term care.  8/27: Medical stable, awaiting disposition as keeps saying that Gary [name] cannot take care of himself.  8/28: Remains stable-awaiting disposition, seems like Gary [name] will be difficult to place.  Assessment & Plan:   * Acute on chronic diastolic CHF (congestive heart failure) ([name]) Patient is presenting with increased work of breathing, shortness of breath and evidence of hypervolemia on examination.  CXR showed underinflation with bilateral areas of scarring or atelectatic change, and vascular congestion. Recent echocardiogram in July 2024 with preserved EF at 55-60% and grade 1 diastolic dysfunction. Creatinine improved. -Decreasing the dose of torsemide to once daily-continue with once daily -Patient is medically stable for discharge but showing a lot of resistance.  Chronic respiratory failure with hypoxia and hypercapnia ([name]) Patient has a history of chronic hypoxic and hypercapnic respiratory failure, currently with tracheostomy in place.  Gary [name] is ventilator dependent at bedtime only and on 6-8 L of supplemental oxygen via trach collar during the daytime.  It seems that patient frequently adjusts his own supplemental oxygen amount, making it difficult to know exactly what Gary [name] is using at home.  VBG notable for significant hypercapnia, however given normal pH, likely chronic Plan: - Continue supplemental oxygen via trach collar to maintain oxygen saturation above 88%.  Due to hypercapnia, avoid  over oxygenation --cont home vent for night-time use  Pseudomonas tracheitis Patient currently symptomatic reporting pain and tenderness with light manipulation of tracheostomy.  Gary [name] is chronically colonized but reports unusual symptoms. --pulm consulted and rec Levaquin and  probiotics Plan: --Completed the course of Levaquin --cont probiotic -ENT consult -no further recommendations except completing the course of Levaquin  Severe COPD (chronic obstructive pulmonary disease) ([name]) Severe COPD on triple bronchodilator.  Current shortness of breath most likely secondary to heart failure, rather than COPD though. --home Pulmicort neb d/c'ed by pulm.  Chronic home prednisone 10 mg daily was reduced to 5 mg daily by pulm. Plan: --cont Brovana --cont prednisone 5 mg daily  --IS and PT/OT daily  Chronic kidney disease, stage 3a ([name]) --Creatinine improved after decreasing the dose of torsemide --monitor  Hx of Hypotension --BP has been wnl.  On home midodrine 10 mg BID. --cont to taper down midodrine as 2.5 mg BID.  OSA (obstructive sleep apnea) --cont home vent for night-time use  Chronic colitis - Continue home mesalamine  Morbid obesity with BMI of 50.0-59.9, adult [facility]) Patient has a history of morbid obesity with a BMI of 52, complicating his current medical care.  Chest pain, ACS ruled out Patient presented with complaints of chest pain, likely due to heart failure exacerbation.  No EKG changes and troponin is negative.   DVT prophylaxis: [id number] Code Status: Full code  Family Communication:  Level of care: Med-Surg Dispo:   The patient is from: home Anticipated d/c is to: To be determined, might need LTC Anticipated d/c date is: 1-2 days.  Now medically stable but very resistant for discharge, likely be discharged tomorrow.   Subjective and Interval History:  Patient was seen and examined today.  No new concern.  Objective: Vitals:   12/12/22 0043 12/12/22 0504 12/12/22 0824 12/12/22 1236  BP: 107/71 111/66 (!) 96/58 124/70  Pulse: 83 80 81 84  Resp: 17 16 18 18   Temp: 98.4 F (36.9 C) 98.7 F (37.1 C) 97.9 F (36.6 C) 97.9 F (36.6 C)  TempSrc:  Oral Oral Oral  SpO2: 95% 94% 91% (!) 87%  Weight:      Height:         Intake/Output Summary (Last 24 hours) at 12/12/2022 1426 Last data filed at 12/12/2022 1412 Gross per 24 hour  Intake 3 ml  Output 1525 ml  Net -1522 ml   Filed Weights   12/02/22 0434 12/03/22 0457 12/09/22 0500  Weight: (!) 192.6 kg (!) 192.1 kg (!) 195 kg    Examination:   General.  Morbidly obese gentleman, in no acute distress.  Trach collar in place Pulmonary.  Lungs clear bilaterally, normal respiratory effort. CV.  Regular rate and rhythm, no JVD, rub or murmur. Abdomen.  Soft, nontender, nondistended, BS positive. CNS.  Alert and oriented .  No focal neurologic deficit. Extremities.  1+ LE edema, no cyanosis, pulses intact and symmetrical. Psychiatry.  Judgment and insight appears normal.   Data Reviewed: I have personally reviewed labs and imaging studies  Time spent: 37 minutes  This record has been created using Conservation officer, historic buildings. Errors have been sought and corrected,but may not always be located. Such creation errors do not reflect on the standard of care.   Arnetha Courser, MD [facility] If 7PM-7AM, please contact night-coverage 12/12/2022, 2:26 PM

## 2022-12-12 NOTE — Progress Notes (Signed)
Home vent declined by pt.  States he may reconsider later & notify nurse if he does

## 2022-12-12 NOTE — Progress Notes (Signed)
Mobility Specialist - Progress Note   12/12/22 1529  Mobility  Activity Transferred from chair to bed  Level of Assistance Dependent, patient does less than 25%  Assistive Device  (Hoyer lift)  RLE Weight Bearing NWB  Activity Response Tolerated well  $Mobility charge 1 Mobility  Mobility Specialist Start Time (ACUTE ONLY) 1500  Mobility Specialist Stop Time (ACUTE ONLY) 1525  Mobility Specialist Time Calculation (min) (ACUTE ONLY) 25 min   Pt sitting in the recliner upon entry, requesting a transfer to bed. Pt transferred to bed via [facility] lift +2 with NT for safety. Pt left in fowler position with needs within reach.  Zetta Bills Mobility Specialist 12/12/22 3:32 PM

## 2022-12-12 NOTE — Plan of Care (Signed)
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
## 2022-12-13 DIAGNOSIS — J9601 Acute respiratory failure with hypoxia: Secondary | ICD-10-CM | POA: Diagnosis not present

## 2022-12-13 DIAGNOSIS — J441 Chronic obstructive pulmonary disease with (acute) exacerbation: Secondary | ICD-10-CM | POA: Diagnosis not present

## 2022-12-13 DIAGNOSIS — R079 Chest pain, unspecified: Secondary | ICD-10-CM | POA: Diagnosis not present

## 2022-12-13 DIAGNOSIS — J9611 Chronic respiratory failure with hypoxia: Secondary | ICD-10-CM | POA: Diagnosis not present

## 2022-12-13 DIAGNOSIS — Z6841 Body Mass Index (BMI) 40.0 and over, adult: Secondary | ICD-10-CM | POA: Diagnosis not present

## 2022-12-13 DIAGNOSIS — J9602 Acute respiratory failure with hypercapnia: Secondary | ICD-10-CM | POA: Diagnosis not present

## 2022-12-13 DIAGNOSIS — J449 Chronic obstructive pulmonary disease, unspecified: Secondary | ICD-10-CM | POA: Diagnosis not present

## 2022-12-13 DIAGNOSIS — E8770 Fluid overload, unspecified: Secondary | ICD-10-CM | POA: Diagnosis not present

## 2022-12-13 DIAGNOSIS — J9612 Chronic respiratory failure with hypercapnia: Secondary | ICD-10-CM | POA: Diagnosis not present

## 2022-12-13 DIAGNOSIS — I5033 Acute on chronic diastolic (congestive) heart failure: Secondary | ICD-10-CM | POA: Diagnosis not present

## 2022-12-13 LAB — BASIC METABOLIC PANEL
Anion gap: 9 (ref 5–15)
BUN: 32 mg/dL — ABNORMAL HIGH (ref 8–23)
CO2: 37 mmol/L — ABNORMAL HIGH (ref 22–32)
Calcium: 8.5 mg/dL — ABNORMAL LOW (ref 8.9–10.3)
Chloride: 95 mmol/L — ABNORMAL LOW (ref 98–111)
Creatinine, Ser: 1.21 mg/dL (ref 0.61–1.24)
GFR, Estimated: >60 mL/min (ref >60)
Glucose, Bld: 104 mg/dL — ABNORMAL HIGH (ref 70–99)
Potassium: 4.3 mmol/L (ref 3.5–5.1)
Sodium: 141 mmol/L (ref 135–145)

## 2022-12-13 NOTE — Progress Notes (Signed)
Pt arrived to the unit from unit 2A alert and oriented x 4. Pt is on trach collar 30%.
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
## 2022-12-13 NOTE — TOC Progression Note (Addendum)
Transition of Care [facility]) - Progression Note    Patient Details  Name: Gary [name] MRN: [id number] Date of Birth: [birth year]  Transition [facility]) CM/SW Contact  Margarito Liner, LCSW Phone Number: 12/13/2022, 10:40 AM  Clinical Narrative:  Left voicemail for [facility] admissions coordinator to confirm she received the referral that was faxed over yesterday afternoon.   11:26 am: [place] is reviewing referral now and will follow up with decision.  Expected Discharge Plan: Skilled Nursing Facility Barriers to Discharge: Other (must enter comment) (Attempting to find SNF or LTC options as patient stated he cannot care for himself.)  Expected Discharge Plan and Services                                               Social Determinants of Health (SDOH) Interventions SDOH Screenings   Food Insecurity: No Food Insecurity (12/01/2022)  Housing: Patient Declined (12/01/2022)  Transportation Needs: No Transportation Needs (12/01/2022)  Utilities: Not At Risk (12/01/2022)  Depression (PHQ2-9): Low Risk  (06/07/2021)  Financial Resource Strain: High Risk (11/21/2022)   Received from [facility]  Physical Activity: Insufficiently Active (05/03/2017)  Social Connections: Patient Declined (11/20/2022)   Received from [facility]  Stress: Stress Concern Present (11/20/2022)   Received from [facility]  Tobacco Use: Medium Risk (12/01/2022)    Readmission Risk Interventions    11/05/2022    2:59 PM 11/05/2022   12:16 PM 12/31/2021    8:58 AM  Readmission Risk Prevention Plan  Transportation Screening Complete Complete Complete  Medication Review Oceanographer) Complete Complete Complete  PCP or Specialist appointment within 3-5 days of discharge Complete Complete Complete  HRI or Home Care Consult Complete Complete Complete  SW Recovery Care/Counseling Consult Complete Complete Complete  Palliative Care Screening Complete Complete Complete  Skilled  Nursing Facility Not Applicable Not Applicable Not Applicable

## 2022-12-13 NOTE — Progress Notes (Signed)
Physical Therapy Treatment Patient Details Name: Gary [name] MRN: [id number] DOB: [birth year] Today's Date: 12/13/2022   History of Present Illness 67 y/o male presented to [facility] ED on 11/28/22 for chest pain x 4 days. Admitted for acute on chronic CHF. Frequent admissions this year with most recent dsicharge on 11/21/22. PMH includes obesity, hypoxia, respiratory failure with tracheostomy, COPD, GIB, HFpEF.    PT Comments  PT/OT co-treat 2/2 to pt's limited activity tolerance. He requires encouragement but once agreeable does cooperate throughout. He was able to exit L side of bed, stand to BRW and ambulate to RN station with chair follow. Pt continues to demand he stand form elevated surface height. Lengthy discussion on importance of lowering surface height to improve functional strength and functional transfers. Pt was in recliner with hoyer sling in place at conclusion of session.     If plan is discharge home, recommend the following: A little help with walking and/or transfers;A lot of help with bathing/dressing/bathroom;Assist for transportation     Equipment Recommendations  Other (comment) (Defer to next level of care)       Precautions / Restrictions Precautions Precautions: Fall Precaution Comments: trach collar/ Trach Restrictions Weight Bearing Restrictions: No     Mobility  Bed Mobility Overal bed mobility: Needs Assistance Bed Mobility: Supine to Sit Rolling: Supervision Supine to sit: Min assist, HOB elevated, +2 for safety/equipment, Used rails   Transfers Overall transfer level: Needs assistance Equipment used: Rolling walker (2 wheels) (bariatric) Transfers: Sit to/from Stand Sit to Stand: Contact guard assist, Min assist  General transfer comment: pt stood from elevated bed height  with +2 asisst for safety however CGA-min of one. Lengthy discussion about importance of needing to stand form lower surface heights." I know, I promise i'll try ."     Ambulation/Gait Ambulation/Gait assistance: Contact guard assist, +2 safety/equipment Gait Distance (Feet): 35 Feet Assistive device: Rolling walker (2 wheels) Gait Pattern/deviations: Step-through pattern, Wide base of support Gait velocity: decreased    General Gait Details: Pt was able to ambulate ~ 35 ft from EOB to RN station with chair follow    Balance Overall balance assessment: Needs assistance Sitting-balance support: Feet supported, No upper extremity supported Sitting balance-[name] Scale: Good     Standing balance support: Reliant on assistive device for balance, During functional activity Standing balance-[name] Scale: Fair Standing balance comment: Reliant on RW/AD for all standing activity.      Cognition Arousal: Alert Behavior During Therapy: WFL for tasks assessed/performed Overall Cognitive Status: Within Functional Limits for tasks assessed       General Comments General comments (skin integrity, edema, etc.): vss throughout with trach collar      Pertinent Vitals/Pain Pain Assessment Pain Assessment: No/denies pain     PT Goals (current goals can now be found in the care plan section) Acute Rehab PT Goals Patient Stated Goal: to get stronger then go home Progress towards PT goals: Progressing toward goals    Frequency    Min 1X/week      PT Plan Current plan remains appropriate    Co-evaluation   Reason for Co-Treatment: For patient/therapist safety;To address functional/ADL transfers PT goals addressed during session: Mobility/safety with mobility OT goals addressed during session: ADL's and self-care      AM-PAC PT "6 Clicks" Mobility   Outcome Measure  Help needed turning from your back to your side while in a flat bed without using bedrails?: A Little Help needed moving from lying on your back to  sitting on the side of a flat bed without using bedrails?: A Little Help needed moving to and from a bed to a chair (including a  wheelchair)?: A Little Help needed standing up from a chair using your arms (e.g., wheelchair or bedside chair)?: A Little Help needed to walk in hospital room?: A Little Help needed climbing 3-5 steps with a railing? : Total 6 Click Score: 16    End of Session Equipment Utilized During Treatment: Other (comment) (4 L trach collar) Activity Tolerance: Patient tolerated treatment well Patient left: in chair;with call bell/phone within reach;with chair alarm set Nurse Communication: Mobility status PT Visit Diagnosis: Muscle weakness (generalized) (M62.81);Difficulty in walking, not elsewhere classified (R26.2)     Time: 9604-5409 PT Time Calculation (min) (ACUTE ONLY): 24 min  Charges:    $Gait Training: 8-22 mins PT General Charges $$ ACUTE PT VISIT: 1 Visit                     Jetta Lout PTA 12/13/22, 4:15 PM

## 2022-12-13 NOTE — Progress Notes (Signed)
PROGRESS NOTE    Gary [name]  UXL:[id number] DOB: 01/06/1956 DOA: 11/28/2022 PCP: Shayne Alken, MD  140A/140A-AA  LOS: 13 days   Brief hospital course: Gary [name] is a 67 y.o. 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   Gary [name] stated that he ran out of his home torsemide several days ago due to a mixup with his pharmacy.  Then over the last couple days, he has noticed increasing lower extremity swelling, shortness of breath and chest pain.  He states that he usually develops chest pain when he has "fluid on his lungs."    8/21: Clinically improving, IV diuretic discontinued due to worsening creatinine and he will be switched to home torsemide from tomorrow.  Medically stable for discharge but keeps saying that he is still feeling weak and does not want to go home sick.  Refusing to work with PT. original PT recommendations were for home health.  8/22: Medically stable but still not ready to get out of hospital stating that he will be right back if we discharged him.  Want to see an ENT stating that his trach is hurting-ENT was consulted  8/23: Medically stable but still refusing to go home.  Will finish the course of Levaquin.  ENT evaluated him and recommending just complete the course of Levaquin and no other intervention needed.  8/24: Remained medically stable.  Continue to say that he cannot take care of himself.  Ask TOC to explore options for long-term care.  8/25: Remained stable, asking to go to [facility] as he been there before, TOC to explore.  Slight worsening of creatinine after restarting home torsemide yesterday, making it once daily instead of twice daily.  8/26: Remained stable.  Renal functions improving with just once daily  dose of torsemide, upset that apparently phlebotomy tried to draw blood from his left hand while he was sleeping, he developed mild edema of hand due to failed attempt.  TOC is still working on placement, likely will need long-term care.  8/27: Medical stable, awaiting disposition as keeps saying that he cannot take care of himself.  8/28: Remains stable-awaiting disposition, seems like he will be difficult to place.  8/29: Overnight trach got dislodged which was replaced.  Some bloody secretions from stroma which was suctioned.  Patient remained stable.  TOC still working on placement  Assessment & Plan:   * Acute on chronic diastolic CHF (congestive heart failure) ([name]) Patient is presenting with increased work of breathing, shortness of breath and evidence of hypervolemia on examination.  CXR showed underinflation with bilateral areas of scarring or atelectatic change, and vascular congestion. Recent echocardiogram in July 2024 with preserved EF at 55-60% and grade 1 diastolic dysfunction. Creatinine improved. -Decreasing the dose of torsemide to once daily-continue with once daily -Patient is medically stable for discharge but showing a lot of resistance.  Chronic respiratory failure with hypoxia and hypercapnia ([name]) Patient has a history of chronic hypoxic and hypercapnic respiratory failure, currently with tracheostomy in place.  He is ventilator dependent at bedtime only and on 6-8 L of supplemental oxygen via trach collar during the daytime.  It seems that patient frequently adjusts his own supplemental oxygen amount, making it difficult to know exactly what he is using at home.  VBG notable  for significant hypercapnia, however given normal pH, likely chronic Plan: - Continue supplemental oxygen via trach collar to maintain oxygen saturation above 88%.  Due to hypercapnia, avoid over oxygenation --cont home vent for night-time use  Pseudomonas tracheitis Patient currently symptomatic  reporting pain and tenderness with light manipulation of tracheostomy.  He is chronically colonized but reports unusual symptoms. --pulm consulted and rec Levaquin and probiotics Plan: --Completed the course of Levaquin --cont probiotic -ENT consult -no further recommendations except completing the course of Levaquin  Severe COPD (chronic obstructive pulmonary disease) ([name]) Severe COPD on triple bronchodilator.  Current shortness of breath most likely secondary to heart failure, rather than COPD though. --home Pulmicort neb d/c'ed by pulm.  Chronic home prednisone 10 mg daily was reduced to 5 mg daily by pulm. Plan: --cont Brovana --cont prednisone 5 mg daily  --IS and PT/OT daily  Chronic kidney disease, stage 3a ([name]) --Creatinine improved after decreasing the dose of torsemide --monitor  Hx of Hypotension --BP has been wnl.  On home midodrine 10 mg BID. --cont to taper down midodrine as 2.5 mg BID.  OSA (obstructive sleep apnea) --cont home vent for night-time use  Chronic colitis - Continue home mesalamine  Morbid obesity with BMI of 50.0-59.9, adult [facility]) Patient has a history of morbid obesity with a BMI of 52, complicating his current medical care.  Chest pain, ACS ruled out Patient presented with complaints of chest pain, likely due to heart failure exacerbation.  No EKG changes and troponin is negative.   DVT prophylaxis: ZO:XWRUEAV Code Status: Full code  Family Communication:  Level of care: Med-Surg Dispo:   The patient is from: home Anticipated d/c is to: To be determined, might need LTC Anticipated d/c date is: 1-2 days.  Now medically stable but very resistant for discharge, likely be discharged tomorrow.   Subjective and Interval History:  Patient was seen and examined today.  No new concern.  He did had accidental dislodging of trach with forceful cough earlier in the morning which was replaced.  Objective: Vitals:   12/13/22 0817 12/13/22 1101  12/13/22 1531 12/13/22 1537  BP: 122/71 106/68 110/67 (!) 108/59  Pulse: 82 83 75 84  Resp: 18 18 18 16   Temp: 97.8 F (36.6 C) 98.6 F (37 C) 98.3 F (36.8 C) 98.4 F (36.9 C)  TempSrc: Oral Oral Oral   SpO2: 95% 94% 95%   Weight:      Height:        Intake/Output Summary (Last 24 hours) at 12/13/2022 1554 Last data filed at 12/13/2022 1101 Gross per 24 hour  Intake 240 ml  Output 1800 ml  Net -1560 ml   Filed Weights   12/02/22 0434 12/03/22 0457 12/09/22 0500  Weight: (!) 192.6 kg (!) 192.1 kg (!) 195 kg    Examination:   General.  Morbidly obese gentleman, in no acute distress. Pulmonary.  Lungs clear bilaterally, normal respiratory effort. CV.  Regular rate and rhythm, no JVD, rub or murmur. Abdomen.  Soft, nontender, nondistended, BS positive. CNS.  Alert and oriented .  No focal neurologic deficit. Extremities.  1+ LE edema, no cyanosis, pulses intact and symmetrical. Psychiatry.  Judgment and insight appears normal.   Data Reviewed: I have personally reviewed labs and imaging studies  Time spent: 39 minutes  This record has been created using Conservation officer, historic buildings. Errors have been sought and corrected,but may not always be located. Such creation errors do not reflect on the standard of care.  Arnetha Courser, MD [facility] If 7PM-7AM, please contact night-coverage 12/13/2022, 3:54 PM

## 2022-12-13 NOTE — Progress Notes (Signed)
Occupational Therapy Re-evaluation  Patient Details Name: Gary [name] MRN: [id number] DOB: 1955/11/09 Today's Date: 12/13/2022   History of present illness 67 y/o male presented to [facility] ED on 11/28/22 for chest pain x 4 days. Admitted for acute on chronic CHF. Frequent admissions this year with most recent dsicharge on 11/21/22. PMH includes obesity, hypoxia, respiratory failure with tracheostomy, COPD, GIB, HFpEF.   OT comments  Chart reviewed to date, pt greeted in room as a co tx with PT. Re-evaluation completed with pt participating in goal setting including wanting to improve bathing independence. Pt performed bed mobility with MIN A +1, STS with CGA-MIN A +1-2, amb in hallway approx 50' with RW with CGA +1 with close chair follow. MIN A required for UB dressing. Pt is making progress towards goals, demonstrates motivation to continue to perform ADL performance. OT will follow acutely.       If plan is discharge home, recommend the following:  A little help with walking and/or transfers;A lot of help with bathing/dressing/bathroom   Equipment Recommendations  None recommended by OT    Recommendations for Other Services      Precautions / Restrictions Precautions Precautions: Fall       Mobility Bed Mobility Overal bed mobility: Needs Assistance Bed Mobility: Supine to Sit     Supine to sit: Min assist, HOB elevated, +2 for safety/equipment, Used rails          Transfers Overall transfer level: Needs assistance Equipment used: None (BRW) Transfers: Sit to/from Stand Sit to Stand: Min assist (+1-2)                 Balance Overall balance assessment: Needs assistance Sitting-balance support: Feet supported, No upper extremity supported Sitting balance-[name] Scale: Good     Standing balance support: Reliant on assistive device for balance, During functional activity Standing balance-[name] Scale: Fair                             ADL either  performed or assessed with clinical judgement   ADL Overall ADL's : Needs assistance/impaired     Grooming: Set up;Sitting           Upper Body Dressing : Minimal assistance;Sitting Upper Body Dressing Details (indicate cue type and reason): gown     Toilet Transfer: Contact guard assist Toilet Transfer Details (indicate cue type and reason): simulated to bedside chair with bari RW         Functional mobility during ADLs: Contact guard assist (BRW with close chair follow approx 50')      Extremity/Trunk Assessment Upper Extremity Assessment Upper Extremity Assessment: Overall WFL for tasks assessed   Lower Extremity Assessment Lower Extremity Assessment: Generalized weakness        Vision Baseline Vision/History: 0 No visual deficits Vision Assessment?: No apparent visual deficits   Perception     Praxis      Cognition Arousal: Alert Behavior During Therapy: WFL for tasks assessed/performed Overall Cognitive Status: Within Functional Limits for tasks assessed                                 General Comments: pt reports he needs to improve functional status, agreeble for STS        Exercises Other Exercises Other Exercises: edu re: role of continued rehab, goal setting for future tx sessions, improve ADL performance  Shoulder Instructions       General Comments vss throughout with trach collar    Pertinent Vitals/ Pain       Pain Assessment Pain Assessment: No/denies pain  Home Living                                          Prior Functioning/Environment              Frequency  Min 1X/week        Progress Toward Goals  OT Goals(current goals can now be found in the care plan section)  Progress towards OT goals: Progressing toward goals  Acute Rehab OT Goals Patient Stated Goal: increase therapy participation OT Goal Formulation: With patient Time For Goal Achievement: 12/27/22 Potential to  Achieve Goals: Good  Plan Discharge plan remains appropriate;Frequency remains appropriate    Co-evaluation    PT/OT/SLP Co-Evaluation/Treatment: Yes Reason for Co-Treatment: For patient/therapist safety;To address functional/ADL transfers   OT goals addressed during session: ADL's and self-care      AM-PAC OT "6 Clicks" Daily Activity     Outcome Measure   Help from another person eating meals?: None Help from another person taking care of personal grooming?: A Little Help from another person toileting, which includes using toliet, bedpan, or urinal?: A Lot Help from another person bathing (including washing, rinsing, drying)?: A Lot Help from another person to put on and taking off regular upper body clothing?: A Little Help from another person to put on and taking off regular lower body clothing?: A Lot 6 Click Score: 16    End of Session Equipment Utilized During Treatment: Oxygen;Rolling walker (2 wheels)  OT Visit Diagnosis: Unsteadiness on feet (R26.81);Other abnormalities of gait and mobility (R26.89);Muscle weakness (generalized) (M62.81);Adult, failure to thrive (R62.7)   Activity Tolerance Patient tolerated treatment well   Patient Left in chair;with call bell/phone within reach   Nurse Communication          Time: 0102-7253 OT Time Calculation (min): 24 min  Charges: OT General Charges $OT Visit: 1 Visit OT Treatments $Therapeutic Activity: 8-22 mins  Oleta Mouse, OTD OTR/L  12/13/22, 3:50 PM

## 2022-12-14 DIAGNOSIS — J9602 Acute respiratory failure with hypercapnia: Secondary | ICD-10-CM | POA: Diagnosis not present

## 2022-12-14 DIAGNOSIS — J9601 Acute respiratory failure with hypoxia: Secondary | ICD-10-CM | POA: Diagnosis not present

## 2022-12-14 DIAGNOSIS — E8770 Fluid overload, unspecified: Secondary | ICD-10-CM | POA: Diagnosis not present

## 2022-12-14 DIAGNOSIS — J441 Chronic obstructive pulmonary disease with (acute) exacerbation: Secondary | ICD-10-CM | POA: Diagnosis not present

## 2022-12-14 DIAGNOSIS — I5033 Acute on chronic diastolic (congestive) heart failure: Secondary | ICD-10-CM | POA: Diagnosis not present

## 2022-12-14 NOTE — Progress Notes (Signed)
PROGRESS NOTE    BECKER FERRING  CZY:[id number] DOB: 03/16/1956 DOA: 11/28/2022 PCP: Shayne Alken, MD  140A/140A-AA  LOS: 14 days   Brief hospital course: Gary [name] is a 67 y.o. 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   Gary [name] stated that he ran out of his home torsemide several days ago due to a mixup with his pharmacy.  Then over the last couple days, he has noticed increasing lower extremity swelling, shortness of breath and chest pain.  He states that he usually develops chest pain when he has "fluid on his lungs."    8/21: Clinically improving, IV diuretic discontinued due to worsening creatinine and he will be switched to home torsemide from tomorrow.  Medically stable for discharge but keeps saying that he is still feeling weak and does not want to go home sick.  Refusing to work with PT. original PT recommendations were for home health.  8/22: Medically stable but still not ready to get out of hospital stating that he will be right back if we discharged him.  Want to see an ENT stating that his trach is hurting-ENT was consulted  8/23: Medically stable but still refusing to go home.  Will finish the course of Levaquin.  ENT evaluated him and recommending just complete the course of Levaquin and no other intervention needed.  8/24: Remained medically stable.  Continue to say that he cannot take care of himself.  Ask TOC to explore options for long-term care.  8/25: Remained stable, asking to go to [facility] as he been there before, TOC to explore.  Slight worsening of creatinine after restarting home torsemide yesterday, making it once daily instead of twice daily.  8/26: Remained stable.  Renal functions improving with just once daily  dose of torsemide, upset that apparently phlebotomy tried to draw blood from his left hand while he was sleeping, he developed mild edema of hand due to failed attempt.  TOC is still working on placement, likely will need long-term care.  8/27: Medical stable, awaiting disposition as keeps saying that he cannot take care of himself.  8/28: Remains stable-awaiting disposition, seems like he will be difficult to place.  8/29: Overnight trach got dislodged which was replaced.  Some bloody secretions from stroma which was suctioned.  Patient remained stable.  TOC still working on placement.  12/14/22: The patient was seen and examined at his bedside.  He states he feels anxious in his room.    Assessment & Plan:   * Acute on chronic diastolic CHF (congestive heart failure) ([name]) Patient is presenting with increased work of breathing, shortness of breath and evidence of hypervolemia on examination.  CXR showed underinflation with bilateral areas of scarring or atelectatic change, and vascular congestion. Recent echocardiogram in July 2024 with preserved EF at 55-60% and grade 1 diastolic dysfunction. Creatinine improved. -Decreasing the dose of torsemide to once daily-continue with once daily -Patient is medically stable for discharge but showing a lot of resistance.  Chronic respiratory failure with hypoxia and hypercapnia ([name]) Patient has a history of chronic hypoxic and hypercapnic respiratory failure, currently with tracheostomy in place.  He is ventilator dependent at bedtime only and on 6-8 L of supplemental oxygen via trach collar during the daytime.  It seems that patient  frequently adjusts his own supplemental oxygen amount, making it difficult to know exactly what he is using at home.  VBG notable for significant hypercapnia, however given normal pH, likely chronic Plan: - Continue supplemental oxygen via trach collar to maintain oxygen saturation above 88%.  Due to hypercapnia, avoid over  oxygenation --cont home vent for night-time use  Pseudomonas tracheitis Patient currently symptomatic reporting pain and tenderness with light manipulation of tracheostomy.  He is chronically colonized but reports unusual symptoms. --pulm consulted and rec Levaquin and probiotics Plan: --Completed the course of Levaquin --cont probiotic -ENT consult -no further recommendations except completing the course of Levaquin  Severe COPD (chronic obstructive pulmonary disease) ([name]) Severe COPD on triple bronchodilator.  Current shortness of breath most likely secondary to heart failure, rather than COPD though. --home Pulmicort neb d/c'ed by pulm.  Chronic home prednisone 10 mg daily was reduced to 5 mg daily by pulm. Plan: --cont Brovana --cont prednisone 5 mg daily  --IS and PT/OT daily  Chronic kidney disease, stage 3a ([name]) --Creatinine improved after decreasing the dose of torsemide --monitor  Hx of Hypotension --BP has been wnl.  On home midodrine 10 mg BID. --cont to taper down midodrine as 2.5 mg BID.  OSA (obstructive sleep apnea) --cont home vent for night-time use  Chronic colitis - Continue home mesalamine  Morbid obesity with BMI of 50.0-59.9, adult [facility]) Patient has a history of morbid obesity with a BMI of 52, complicating his current medical care.  Chest pain, ACS ruled out Patient presented with complaints of chest pain, likely due to heart failure exacerbation.  No EKG changes and troponin is negative.  Anxiety Continue hydroxyzine as needed.   DVT prophylaxis: ZO:XWRUEAV Code Status: Full code  Family Communication:  Level of care: Med-Surg Dispo:   The patient is from: home Anticipated d/c is to: To be determined, might need LTC Anticipated d/c date is: 1-2 days.  Now medically stable but very resistant for discharge, likely be discharged tomorrow.   Subjective and Interval History:  Patient was seen and examined today.  No new concern.  He did had  accidental dislodging of trach with forceful cough earlier in the morning which was replaced.  Objective: Vitals:   12/14/22 0047 12/14/22 0048 12/14/22 0746 12/14/22 0841  BP:    (!) 91/56  Pulse:    89  Resp:    16  Temp:    98.5 F (36.9 C)  TempSrc:    Oral  SpO2: 93% 93% 95% 91%  Weight:      Height:        Intake/Output Summary (Last 24 hours) at 12/14/2022 1816 Last data filed at 12/14/2022 1723 Gross per 24 hour  Intake --  Output 3200 ml  Net -3200 ml   Filed Weights   12/02/22 0434 12/03/22 0457 12/09/22 0500  Weight: (!) 192.6 kg (!) 192.1 kg (!) 195 kg    Examination:   General.  Morbidly obese gentleman, in no acute distress. Pulmonary.  Lungs clear bilaterally, normal respiratory effort. CV.  Regular rate and rhythm, no JVD, rub or murmur. Abdomen.  Soft, nontender, nondistended, BS positive. CNS.  Alert and oriented .  No focal neurologic deficit. Extremities.  1+ LE edema, no cyanosis, pulses intact and symmetrical. Psychiatry.  Judgment and insight appears normal.   Data Reviewed: I have personally reviewed labs and imaging studies  Time spent: 39 minutes  This record has been created using Conservation officer, historic buildings. Errors have been sought  and corrected,but may not always be located. Such creation errors do not reflect on the standard of care.   Darlin Drop, MD [facility] If 7PM-7AM, please contact night-coverage 12/14/2022, 6:16 PM

## 2022-12-14 NOTE — Care Management Important Message (Signed)
Important Message  Patient Details  Name: Gary [name] MRN: [id number] Date of Birth: [birth year]   Medicare Important Message Given:  Yes     [name] 12/14/2022, 11:41 AM

## 2022-12-14 NOTE — Plan of Care (Signed)

## 2022-12-14 NOTE — Progress Notes (Signed)
Physical Therapy Treatment Patient Details Name: Gary [name] MRN: [id number] DOB: Jul 02, 1955 Today's Date: 12/14/2022   History of Present Illness 67 y/o male presented to [facility] ED on 11/28/22 for chest pain x 4 days. Admitted for acute on chronic CHF. Frequent admissions this year with most recent dsicharge on 11/21/22. PMH includes obesity, hypoxia, respiratory failure with tracheostomy, COPD, GIB, HFpEF.    PT Comments  Pt was long sitting in bed with RN and RT at bedside. Pt is A and O x 4. Agrees to session and remains cooperative throughout." I want to get up and walk a few times today." Pt refused to use mobile trach collar during ambulation. Encouraged use in future due to pt desaturation to 86%. SOB noted but recovers quickly. " I will ue it form now on." Pt did tolerate gait from EOB to RN desk and return without seated rest. Acute PT will continue to progress pt per current POC. Main focus is to improve transfers from lower surface heights. Yard stick left in room to measure transfer heights.    If plan is discharge home, recommend the following: A little help with walking and/or transfers;A lot of help with bathing/dressing/bathroom;Assist for transportation   Can travel by private vehicle     No  Equipment Recommendations  Other (comment) (Defer to next level of care)       Precautions / Restrictions Precautions Precautions: Fall Precaution Comments: trach collar/ Trach Restrictions Weight Bearing Restrictions: No     Mobility  Bed Mobility Overal bed mobility: Needs Assistance Bed Mobility: Supine to Sit Rolling: Supervision Supine to sit: Min assist, HOB elevated, +2 for safety/equipment, Used rails Sit to supine: Mod assist     Transfers Overall transfer level: Needs assistance Equipment used: Rolling walker (2 wheels) Transfers: Sit to/from Stand Sit to Stand: Contact guard assist      Ambulation/Gait Ambulation/Gait assistance: Supervision (chair  follow) Gait Distance (Feet): 60 Feet Assistive device: Rolling walker (2 wheels) (BRW) Gait Pattern/deviations: Step-through pattern, Wide base of support Gait velocity: decreased  General Gait Details: Pt ambulated without mobile trach collar. Recommend use during future ambulation due to pt desaturating to 86%. Pt agreeable that next gait trials he will use trach collar to avoid desatuation. HR peaked at 117 during gait. Pt does have SOB noted but recovers quickly    Balance Overall balance assessment: Needs assistance Sitting-balance support: Feet supported, No upper extremity supported Sitting balance-[name] Scale: Good     Standing balance support: Bilateral upper extremity supported, During functional activity, Reliant on assistive device for balance Standing balance-[name] Scale: Fair Standing balance comment: Reliant on RW/AD for all standing activity.       Cognition Arousal: Alert Behavior During Therapy: WFL for tasks assessed/performed Overall Cognitive Status: Within Functional Limits for tasks assessed      General Comments: pt reports he needs to improve functional status, agreeble for STR           General Comments General comments (skin integrity, edema, etc.): pt was educated on importance of being able to stand form lower surface heights. Left yard stick in room to measure standing heights with encouragement to try form lower surfaces each attempt. pt continues to like to dictate session progression and is limited by his fear/anxiety.      Pertinent Vitals/Pain Pain Assessment Pain Assessment: No/denies pain Faces Pain Scale: No hurt Pain Descriptors / Indicators: Aching, Guarding, Grimacing Pain Intervention(s): Limited activity within patient's tolerance, Monitored during session, Premedicated before session,  Repositioned     PT Goals (current goals can now be found in the care plan section) Acute Rehab PT Goals Patient Stated Goal: to get  stronger Progress towards PT goals: Progressing toward goals    Frequency    Min 1X/week      PT Plan Current plan remains appropriate    Co-evaluation     PT goals addressed during session: Mobility/safety with mobility;Balance;Proper use of DME;Strengthening/ROM        AM-PAC PT "6 Clicks" Mobility   Outcome Measure  Help needed turning from your back to your side while in a flat bed without using bedrails?: A Little Help needed moving from lying on your back to sitting on the side of a flat bed without using bedrails?: A Little Help needed moving to and from a bed to a chair (including a wheelchair)?: A Little Help needed standing up from a chair using your arms (e.g., wheelchair or bedside chair)?: A Little Help needed to walk in hospital room?: A Little Help needed climbing 3-5 steps with a railing? : A Lot 6 Click Score: 17    End of Session   Activity Tolerance: Patient tolerated treatment well Patient left: in bed;with call bell/phone within reach Nurse Communication: Mobility status PT Visit Diagnosis: Muscle weakness (generalized) (M62.81);Difficulty in walking, not elsewhere classified (R26.2)     Time: 4098-1191 PT Time Calculation (min) (ACUTE ONLY): 28 min  Charges:    $Gait Training: 8-22 mins $Therapeutic Activity: 8-22 mins PT General Charges $$ ACUTE PT VISIT: 1 Visit                    Jetta Lout PTA 12/14/22, 9:55 AM

## 2022-12-14 NOTE — Progress Notes (Signed)
Occupational Therapy Treatment Patient Details Name: Gary [name] MRN: [id number] DOB: [birth year] Today's Date: 12/14/2022   History of present illness 67 y/o male presented to [facility] ED on 11/28/22 for chest pain x 4 days. Admitted for acute on chronic CHF. Frequent admissions this year with most recent dsicharge on 11/21/22. PMH includes obesity, hypoxia, respiratory failure with tracheostomy, COPD, GIB, HFpEF.   OT comments  Chart reviewed, pt greeted in bed, agreeable to OT tx session. Pt continues to lead and self direct tx sessions, mildly receptive to therapist education re: standing from lower heights to facilitate eventual shower transfers. Pt is making progress in functional mobility, will continue to benefit from acute OT to address deficits.       If plan is discharge home, recommend the following:  A little help with walking and/or transfers;A lot of help with bathing/dressing/bathroom   Equipment Recommendations  None recommended by OT    Recommendations for Other Services      Precautions / Restrictions Precautions Precautions: Fall Precaution Comments: trach collar/ Trach       Mobility Bed Mobility Overal bed mobility: Needs Assistance Bed Mobility: Supine to Sit Rolling:  (with use of bed features, pt self directs)   Supine to sit: Supervision, Used rails (with use of bed features, pt self directs)          Transfers Overall transfer level: Needs assistance Equipment used: Rolling walker (2 wheels) Transfers: Sit to/from Stand Sit to Stand: Contact guard assist                 Balance Overall balance assessment: Needs assistance Sitting-balance support: Feet supported, No upper extremity supported Sitting balance-[name] Scale: Good     Standing balance support: Bilateral upper extremity supported, During functional activity, Reliant on assistive device for balance Standing balance-[name] Scale: Fair                             ADL  either performed or assessed with clinical judgement   ADL Overall ADL's : Needs assistance/impaired     Grooming: Set up;Sitting               Lower Body Dressing: Total assistance;Sitting/lateral leans Lower Body Dressing Details (indicate cue type and reason): don shoes Toilet Transfer: Contact guard assist Toilet Transfer Details (indicate cue type and reason): simulated to bedside chair with bari RW         Functional mobility during ADLs: Contact guard assist (BRW with close chair follow approx 75')      Extremity/Trunk Assessment              Vision       Perception     Praxis      Cognition Arousal: Alert Behavior During Therapy: WFL for tasks assessed/performed Overall Cognitive Status: Within Functional Limits for tasks assessed                                 General Comments: pt agreeable to improve functional performance        Exercises Other Exercises Other Exercises: edu re: goal setting for future tx sessions    Shoulder Instructions       General Comments vss throughout    Pertinent Vitals/ Pain       Pain Assessment Pain Assessment: No/denies pain  Home Living  Prior Functioning/Environment              Frequency  Min 1X/week        Progress Toward Goals  OT Goals(current goals can now be found in the care plan section)  Progress towards OT goals: Progressing toward goals     Plan      Co-evaluation                 AM-PAC OT "6 Clicks" Daily Activity     Outcome Measure     Help from another person taking care of personal grooming?: A Little Help from another person toileting, which includes using toliet, bedpan, or urinal?: A Lot Help from another person bathing (including washing, rinsing, drying)?: A Lot Help from another person to put on and taking off regular upper body clothing?: A Little Help from another person to  put on and taking off regular lower body clothing?: A Lot 6 Click Score: 12    End of Session Equipment Utilized During Treatment: Oxygen;Rolling walker (2 wheels)  OT Visit Diagnosis: Unsteadiness on feet (R26.81);Other abnormalities of gait and mobility (R26.89);Muscle weakness (generalized) (M62.81);Adult, failure to thrive (R62.7)   Activity Tolerance Patient tolerated treatment well   Patient Left in chair;with call bell/phone within reach   Nurse Communication Mobility status        Time: 7829-5621 OT Time Calculation (min): 25 min  Charges: OT General Charges $OT Visit: 1 Visit OT Treatments $Self Care/Home Management : 8-22 mins $Therapeutic Activity: 8-22 mins  Oleta Mouse, OTD OTR/L  12/14/22, 3:48 PM
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
## 2022-12-14 NOTE — Progress Notes (Signed)
   12/14/22 1000  Spiritual Encounters  Type of Visit Initial  Care provided to: Patient  Referral source Chaplain assessment  Reason for visit Routine spiritual support  OnCall Visit No  Spiritual Framework  Presenting Themes Meaning/purpose/sources of inspiration;Goals in life/care;Impactful experiences and emotions  Patient Stress Factors Major life changes  Goals  Self/Personal Goals To walk out of [facility]  Interventions  Spiritual Care Interventions Made Established relationship of care and support;Compassionate presence;Reflective listening;Normalization of emotions;Explored ethical dilemma;Prayer  Intervention Outcomes  Outcomes Connection to spiritual care  Spiritual Care Plan  Spiritual Care Issues Still Outstanding No further spiritual care needs at this time (see row info)   Patient express the desire to be able to walk and is motivate to do so. Patient does believe in God and said he was praying the night before to be able to walk. Patient said he woke up after praying and this morning was able to walk to the [employer]. Patient was also dealing with the lost of his 1st cousin son that pass away last night.

## 2022-12-15 DIAGNOSIS — J449 Chronic obstructive pulmonary disease, unspecified: Secondary | ICD-10-CM | POA: Diagnosis not present

## 2022-12-15 DIAGNOSIS — J9612 Chronic respiratory failure with hypercapnia: Secondary | ICD-10-CM | POA: Diagnosis not present

## 2022-12-15 DIAGNOSIS — I5033 Acute on chronic diastolic (congestive) heart failure: Secondary | ICD-10-CM | POA: Diagnosis not present

## 2022-12-15 DIAGNOSIS — J9611 Chronic respiratory failure with hypoxia: Secondary | ICD-10-CM | POA: Diagnosis not present

## 2022-12-15 NOTE — Progress Notes (Signed)
Progress Note   Patient: Gary [name] UJW:[id number] DOB: Aug 18, 1955 DOA: 11/28/2022     15 DOS: the patient was seen and examined on 12/15/2022   Brief hospital course: Gary [name] is a 67 y.o. 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   Gary [name] stated that he ran out of his home torsemide several days ago due to a mixup with his pharmacy.  Then over the last couple days, he has noticed increasing lower extremity swelling, shortness of breath and chest pain.  He states that he usually develops chest pain when he has "fluid on his lungs."    Patient was initially diuresed with IV diuresis, responded well.  Significant improvement in peripheral edema.  IV Lasix was switched with home torsemide once creatinine started increasing.  Patient remained stable from respiratory standpoint point and remained on 6 to 8 L of supplemental oxygen via trach collar which is his baseline.  Patient developed Pseudomonas tracheitis, which was treated with Levaquin as he developed some pain around his tracheostomy.  Patient condition has stabilized, pending long-term care placement.  However, patient now indicating that he wants to go home next week.   Principal Problem:   Acute on chronic diastolic CHF (congestive heart failure) ([name]) Active Problems:   Chronic respiratory failure with hypoxia and hypercapnia ([name])   Chest pain   COPD (chronic obstructive pulmonary disease) ([name])   Chronic kidney disease, stage 3a ([name])   Hypotension   OSA (obstructive sleep apnea)   Morbid obesity with BMI of 50.0-59.9, adult ([name])   Chronic colitis   Fluid overload   Assessment and Plan: * Acute on chronic diastolic CHF (congestive heart failure) ([name]) Patient is presenting with  increased work of breathing, shortness of breath and evidence of hypervolemia on examination.  CXR showed underinflation with bilateral areas of scarring or atelectatic change, and vascular congestion. Recent echocardiogram in July 2024 with preserved EF at 55-60% and grade 1 diastolic dysfunction. Patient condition improved after initial treatment with IV furosemide, currently on oral torsemide..   Chronic respiratory failure with hypoxia and hypercapnia ([name]) Is on 6 to 8 L of oxygen over trach collar, stable.  Pseudomonas tracheitis Patient currently symptomatic reporting pain and tenderness with light manipulation of tracheostomy.  He is chronically colonized but reports unusual symptoms. Patient has completed Levaquin. -ENT consult -no further recommendations except completing the course of Levaquin   Severe COPD (chronic obstructive pulmonary disease) ([name]) Severe COPD on triple bronchodilator.  Current shortness of breath most likely secondary to heart failure, rather than COPD though. --home Pulmicort neb d/c'ed by pulm.  Chronic home prednisone 10 mg daily was reduced to 5 mg daily by pulm.    Chronic kidney disease, stage 3a ([name]) --Creatinine improved after decreasing the dose of torsemide --monitor   Hypotension Blood pressure much better, on midodrine 2.5 mg 3 times a day as needed.   OSA (obstructive sleep apnea) --cont home vent for night-time use   Chronic colitis - Continue home mesalamine   Morbid obesity with BMI of 50.0-59.9, adult [facility]) Patient has a history of morbid obesity with a BMI of 52, complicating his current medical care.   Chest pain, ACS ruled out Noncardiac in nature.   Anxiety Continue hydroxyzine as needed.  Subjective:  Patient doing well today, stating that he does not want to go to long-term care, wants to go home next week.  Physical Exam: Vitals:   12/14/22 2042 12/14/22 2133 12/14/22 2334 12/15/22 0811  BP:  (!) 88/52   118/73  Pulse:  87  80  Resp:  20  19  Temp:  98 F (36.7 C)  98 F (36.7 C)  TempSrc:      SpO2: 95% 92% 94% 92%  Weight:      Height:       General exam: Appears calm and comfortable, morbidly obese. Respiratory system: Decreased breathing sounds. Respiratory effort normal. Cardiovascular system: S1 & S2 heard, RRR. No JVD, murmurs, rubs, gallops or clicks. No pedal edema. Gastrointestinal system: Abdomen is nondistended, soft and nontender. No organomegaly or masses felt. Normal bowel sounds heard. Central nervous system: Alert and oriented. No focal neurological deficits. Extremities: Symmetric 5 x 5 power. Skin: No rashes, lesions or ulcers Psychiatry:  Mood & affect appropriate.    Data Reviewed:  Lab results reviewed.  Family Communication: None  Disposition: Status is: Inpatient Remains inpatient appropriate because: Unsafe discharge option.     Time spent: 35 minutes  Author: Marrion Coy, MD 12/15/2022 12:46 PM  For on call review [email or web address].

## 2022-12-15 NOTE — Plan of Care (Signed)

## 2022-12-15 NOTE — Progress Notes (Signed)
Mobility Specialist - Progress Note     12/15/22 1119  Mobility  Activity Ambulated with assistance in hallway  Level of Assistance +2 (takes two people)  Press photographer wheel walker  Distance Ambulated (ft) 20 ft  Range of Motion/Exercises Active  RLE Weight Bearing WBAT  Activity Response Tolerated well  Mobility Referral Yes  $Mobility charge 1 Mobility  Mobility Specialist Start Time (ACUTE ONLY) 1054  Mobility Specialist Stop Time (ACUTE ONLY) 1120  Mobility Specialist Time Calculation (min) (ACUTE ONLY) 26 min   Pt resting in bed on trach collar saturating on 8L upon entry. Pt agreeable to participate in mobility with encouragement from RN and Mobility staff. Pt STS to BRW and ambulates to hallway in front of NS with close chair follow +2 assist for safety. Pt took x1 seated rest break to recover breathing (desat to 85%) before being wheeled back to room in recliner. Pt left in recliner with needs in reach. Pt O2 returned to 92% at time of departure.    Johnathan Hausen Mobility Specialist 12/15/22, 1:55 PM

## 2022-12-16 DIAGNOSIS — I5033 Acute on chronic diastolic (congestive) heart failure: Secondary | ICD-10-CM | POA: Diagnosis not present

## 2022-12-16 DIAGNOSIS — J9612 Chronic respiratory failure with hypercapnia: Secondary | ICD-10-CM | POA: Diagnosis not present

## 2022-12-16 DIAGNOSIS — J449 Chronic obstructive pulmonary disease, unspecified: Secondary | ICD-10-CM | POA: Diagnosis not present

## 2022-12-16 DIAGNOSIS — J9611 Chronic respiratory failure with hypoxia: Secondary | ICD-10-CM | POA: Diagnosis not present

## 2022-12-16 DIAGNOSIS — Z6841 Body Mass Index (BMI) 40.0 and over, adult: Secondary | ICD-10-CM | POA: Diagnosis not present

## 2022-12-16 DIAGNOSIS — R079 Chest pain, unspecified: Secondary | ICD-10-CM | POA: Diagnosis not present

## 2022-12-16 DIAGNOSIS — E8770 Fluid overload, unspecified: Secondary | ICD-10-CM | POA: Diagnosis not present

## 2022-12-16 LAB — CBC
HCT: 39.6 % (ref 39.0–52.0)
Hemoglobin: 10.3 g/dL — ABNORMAL LOW (ref 13.0–17.0)
MCH: 21.1 pg — ABNORMAL LOW (ref 26.0–34.0)
MCHC: 26 g/dL — ABNORMAL LOW (ref 30.0–36.0)
MCV: 81.1 fL (ref 80.0–100.0)
Platelets: 221 10*3/uL (ref 150–400)
RBC: 4.88 MIL/uL (ref 4.22–5.81)
RDW: 18.2 % — ABNORMAL HIGH (ref 11.5–15.5)
WBC: 8.2 10*3/uL (ref 4.0–10.5)
nRBC: 0.2 % (ref 0.0–0.2)

## 2022-12-16 NOTE — Progress Notes (Signed)
Progress Note   Patient: Gary [name] ZDG:[id number] DOB: 1956-03-18 DOA: 11/28/2022     16 DOS: the patient was seen and examined on 12/16/2022   Brief hospital course: Gary [name] is a 66 y.o. 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   Gary [name] stated that he ran out of his home torsemide several days ago due to a mixup with his pharmacy.  Then over the last couple days, he has noticed increasing lower extremity swelling, shortness of breath and chest pain.  He states that he usually develops chest pain when he has "fluid on his lungs."    Patient was initially diuresed with IV diuresis, responded well.  Significant improvement in peripheral edema.  IV Lasix was switched with home torsemide once creatinine started increasing.  Patient remained stable from respiratory standpoint point and remained on 6 to 8 L of supplemental oxygen via trach collar which is his baseline.  Patient developed Pseudomonas tracheitis, which was treated with Levaquin as he developed some pain around his tracheostomy.  Patient condition has stabilized, pending long-term care placement.    Principal Problem:   Acute on chronic diastolic CHF (congestive heart failure) ([name]) Active Problems:   Chronic respiratory failure with hypoxia and hypercapnia ([name])   Chest pain   COPD (chronic obstructive pulmonary disease) ([name])   Chronic kidney disease, stage 3a ([name])   Hypotension   OSA (obstructive sleep apnea)   Morbid obesity with BMI of 50.0-59.9, adult ([name])   Chronic colitis   Fluid overload   Assessment and Plan: * Acute on chronic diastolic CHF (congestive heart failure) ([name]) Patient is presenting with increased work of breathing, shortness of breath and evidence of  hypervolemia on examination.  CXR showed underinflation with bilateral areas of scarring or atelectatic change, and vascular congestion. Recent echocardiogram in July 2024 with preserved EF at 55-60% and grade 1 diastolic dysfunction. Patient condition improved after initial treatment with IV furosemide, currently on oral torsemide..   Chronic respiratory failure with hypoxia and hypercapnia ([name]) Is on 6 to 8 L of oxygen over trach collar, stable.  Pseudomonas tracheitis Patient currently symptomatic reporting pain and tenderness with light manipulation of tracheostomy.  He is chronically colonized but reports unusual symptoms. Patient has completed Levaquin. -ENT consult -no further recommendations except completing the course of Levaquin   Severe COPD (chronic obstructive pulmonary disease) ([name]) Severe COPD on triple bronchodilator.  Current shortness of breath most likely secondary to heart failure, rather than COPD though. --home Pulmicort neb d/c'ed by pulm.  Chronic home prednisone 10 mg daily was reduced to 5 mg daily by pulm.    Chronic kidney disease, stage 3a ([name]) --Creatinine improved after decreasing the dose of torsemide --monitor   Hypotension Blood pressure much better, on midodrine 2.5 mg 3 times a day as needed.   OSA (obstructive sleep apnea) --cont home vent for night-time use   Chronic colitis - Continue home mesalamine   Morbid obesity with BMI of 50.0-59.9, adult [facility]) Patient has a history of morbid obesity with a BMI of 52, complicating his current medical care.   Chest pain, ACS ruled out Noncardiac in nature.   Anxiety Continue hydroxyzine as needed. Stress   Subjective:  Patient was seen and examined today.  No new concern.  Stating  that if he cannot find a place he will go home but he has to be able to walk around without any problem before deciding about going home.  Physical Exam: Vitals:   12/15/22 1548 12/15/22 2038 12/16/22 0002  12/16/22 0741  BP: 112/73  118/80 104/67  Pulse: 83  83 83  Resp: 18  20 16   Temp: 97.7 F (36.5 C)  98.1 F (36.7 C) 98.2 F (36.8 C)  TempSrc:      SpO2: 93% 94% 95% 92%  Weight:      Height:       General.  Morbidly obese gentleman, in no acute distress. Pulmonary.  Lungs clear bilaterally, normal respiratory effort. CV.  Regular rate and rhythm, no JVD, rub or murmur. Abdomen.  Soft, nontender, nondistended, BS positive. CNS.  Alert and oriented .  No focal neurologic deficit. Extremities.  Trace LE edema, no cyanosis, pulses intact and symmetrical. Psychiatry.  Judgment and insight appears normal.    Data Reviewed:  Lab results reviewed.  Family Communication: None  Disposition: Status is: Inpatient Remains inpatient appropriate because: Unsafe discharge option.     Time spent: 40 minutes  This record has been created using Conservation officer, historic buildings. Errors have been sought and corrected,but may not always be located. Such creation errors do not reflect on the standard of care.   Author: Arnetha Courser, MD 12/16/2022 4:02 PM  For on call review [email or web address].

## 2022-12-16 NOTE — Progress Notes (Signed)
Pt does not want to get OOB at this time. Pt expressed he will at a later time.

## 2022-12-16 NOTE — Plan of Care (Signed)
  Problem: Nutrition: Goal: Adequate nutrition will be maintained Outcome: Progressing   Problem: Activity: Goal: Risk for activity intolerance will decrease Outcome: Progressing   Problem: Pain Managment: Goal: General experience of comfort will improve Outcome: Progressing   Problem: Elimination: Goal: Will not experience complications related to bowel motility Outcome: Progressing   Problem: Safety: Goal: Ability to remain free from injury will improve Outcome: Progressing   Problem: Skin Integrity: Goal: Risk for impaired skin integrity will decrease Outcome: Progressing

## 2022-12-16 NOTE — Plan of Care (Signed)

## 2022-12-16 NOTE — Progress Notes (Signed)
Patient seen for home vent assist. Patient was not ready to administer when I arrived as he wanted to eat ice cream. Vent was setup at bedside and pulled into red outlets. O2 in line at 3 liters. Patient stated he could place himself on equipment as such is what he does at home. All equipment within reach. Will have RN to call RT if assistance is needed.

## 2022-12-17 DIAGNOSIS — J9602 Acute respiratory failure with hypercapnia: Secondary | ICD-10-CM | POA: Diagnosis not present

## 2022-12-17 DIAGNOSIS — J449 Chronic obstructive pulmonary disease, unspecified: Secondary | ICD-10-CM | POA: Diagnosis not present

## 2022-12-17 DIAGNOSIS — J441 Chronic obstructive pulmonary disease with (acute) exacerbation: Secondary | ICD-10-CM | POA: Diagnosis not present

## 2022-12-17 DIAGNOSIS — J9611 Chronic respiratory failure with hypoxia: Secondary | ICD-10-CM | POA: Diagnosis not present

## 2022-12-17 DIAGNOSIS — J9601 Acute respiratory failure with hypoxia: Secondary | ICD-10-CM | POA: Diagnosis not present

## 2022-12-17 DIAGNOSIS — I5033 Acute on chronic diastolic (congestive) heart failure: Secondary | ICD-10-CM | POA: Diagnosis not present

## 2022-12-17 DIAGNOSIS — Z6841 Body Mass Index (BMI) 40.0 and over, adult: Secondary | ICD-10-CM | POA: Diagnosis not present

## 2022-12-17 DIAGNOSIS — J9612 Chronic respiratory failure with hypercapnia: Secondary | ICD-10-CM | POA: Diagnosis not present

## 2022-12-17 DIAGNOSIS — R079 Chest pain, unspecified: Secondary | ICD-10-CM | POA: Diagnosis not present

## 2022-12-17 DIAGNOSIS — E8770 Fluid overload, unspecified: Secondary | ICD-10-CM | POA: Diagnosis not present

## 2022-12-17 MED ORDER — MIDODRINE HCL 5 MG PO TABS
2.5000 mg | ORAL_TABLET | Freq: Three times a day (TID) | ORAL | Status: DC
  Administered 2022-12-17 – 2022-12-26 (×23): 2.5 mg via ORAL
  Filled 2022-12-17 (×26): qty 1

## 2022-12-17 NOTE — Progress Notes (Signed)
Physical Therapy Treatment Patient Details Name: Gary [name] MRN: [id number] DOB: Sep 26, 1955 Today's Date: 12/17/2022   History of Present Illness 67 y/o male presented to [facility] ED on 11/28/22 for chest pain x 4 days. Admitted for acute on chronic CHF. Frequent admissions this year with most recent dsicharge on 11/21/22. PMH includes obesity, hypoxia, respiratory failure with tracheostomy, COPD, GIB, HFpEF.    PT Comments  Pt was long sitting in bed upon arrival. On 5 L trach collar. Agrees to session and is eager for OOB/gait. Pt required min assist to exit bed. Once seated EOB, only required CGA for safety to stand from elevated bed(29 inch) and ambulate ~ 100 ft. Chair follow for safety. Lengthy discussion about need to improve standing abilities from lower surfaces and to focus on improving gait tolerance with the mobility tech team. He states understanding. Acute PT will continue to follow and progress per current POC. Pt was sitting in recliner at conclusion of session. Will require hoyer lift transfer back to bed due to pt's inability to stand from standard height surface.    If plan is discharge home, recommend the following: A little help with walking and/or transfers;A lot of help with bathing/dressing/bathroom;Assist for transportation     Equipment Recommendations  Other (comment) (Defer to next level of care)       Precautions / Restrictions Precautions Precautions: Fall Precaution Comments: trach collar/ Trach (5 L in rm - 6L when ambulating) Restrictions Weight Bearing Restrictions: No RLE Weight Bearing: Weight bearing as tolerated     Mobility  Bed Mobility Overal bed mobility: Needs Assistance Bed Mobility: Supine to Sit Rolling: Supervision, Used rails Supine to sit: Min assist, Used rails  General bed mobility comments: pt did require min assist today to achieve EOB sitting. Once in sitting, ws able to stand and ambulate with CGA only    Transfers Overall transfer  level: Needs assistance Equipment used: Rolling walker (2 wheels) (Bariatric) Transfers: Sit to/from Stand Sit to Stand: Contact guard assist, From elevated surface (29in surface height (yard stick in room))     Ambulation/Gait Ambulation/Gait assistance: Contact guard assist, +2 safety/equipment (chair follow for safety) Gait Distance (Feet): 100 Feet Assistive device: Rolling walker (2 wheels) Gait Pattern/deviations: Step-through pattern, Wide base of support Gait velocity: decreased     General Gait Details: Pt was able to ambulate ~ 100 ft with 6L o2 trach collar. 2 standing rest but overall tolerated well." Ill go more everytime." Cotninued discussion about need to focus PT efforts on standing/transfers an that he can do ambulation with mobility team. " I try to stand from lower tomorrow."    Balance Overall balance assessment: Needs assistance Sitting-balance support: Feet supported, No upper extremity supported Sitting balance-[name] Scale: Good     Standing balance support: Bilateral upper extremity supported, During functional activity, Reliant on assistive device for balance Standing balance-[name] Scale: Fair Standing balance comment: Reliant on RW/AD for all standing activity.      Cognition Arousal: Alert Behavior During Therapy: WFL for tasks assessed/performed Overall Cognitive Status: Within Functional Limits for tasks assessed      General Comments: Pt is motivated to get OOB and ambulate               Pertinent Vitals/Pain Pain Assessment Pain Assessment: 0-10 Pain Score: 3  Pain Location: L foot Pain Descriptors / Indicators: Aching, Guarding, Grimacing Pain Intervention(s): Limited activity within patient's tolerance, Monitored during session, Premedicated before session, Repositioned     PT  Goals (current goals can now be found in the care plan section) Acute Rehab PT Goals Patient Stated Goal: to get stronger Progress towards PT goals:  Progressing toward goals    Frequency    Min 1X/week      PT Plan Current plan remains appropriate    Co-evaluation     PT goals addressed during session: Mobility/safety with mobility;Balance;Proper use of DME;Strengthening/ROM        AM-PAC PT "6 Clicks" Mobility   Outcome Measure  Help needed turning from your back to your side while in a flat bed without using bedrails?: A Little Help needed moving from lying on your back to sitting on the side of a flat bed without using bedrails?: A Little Help needed moving to and from a bed to a chair (including a wheelchair)?: A Little Help needed standing up from a chair using your arms (e.g., wheelchair or bedside chair)?: A Little Help needed to walk in hospital room?: A Little Help needed climbing 3-5 steps with a railing? : A Lot 6 Click Score: 17    End of Session Equipment Utilized During Treatment: Oxygen (6L o2 trach collar during gait) Activity Tolerance: Patient tolerated treatment well Patient left: in chair;with call bell/phone within reach;with family/visitor present Nurse Communication: Mobility status PT Visit Diagnosis: Muscle weakness (generalized) (M62.81);Difficulty in walking, not elsewhere classified (R26.2)     Time: 6644-0347 PT Time Calculation (min) (ACUTE ONLY): 16 min  Charges:    $Gait Training: 8-22 mins PT General Charges $$ ACUTE PT VISIT: 1 Visit                     Jetta Lout PTA 12/17/22, 12:32 PM

## 2022-12-17 NOTE — Plan of Care (Signed)
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
## 2022-12-17 NOTE — Plan of Care (Signed)
?  Problem: Clinical Measurements: ?Goal: Ability to maintain clinical measurements within normal limits will improve ?Outcome: Progressing ?Goal: Will remain free from infection ?Outcome: Progressing ?Goal: Diagnostic test results will improve ?Outcome: Progressing ?Goal: Respiratory complications will improve ?Outcome: Progressing ?Goal: Cardiovascular complication will be avoided ?Outcome: Progressing ?  ?Problem: Activity: ?Goal: Risk for activity intolerance will decrease ?Outcome: Progressing ?  ?Problem: Coping: ?Goal: Level of anxiety will decrease ?Outcome: Progressing ?  ?Problem: Elimination: ?Goal: Will not experience complications related to bowel motility ?Outcome: Progressing ?Goal: Will not experience complications related to urinary retention ?Outcome: Progressing ?  ?Problem: Pain Managment: ?Goal: General experience of comfort will improve ?Outcome: Progressing ?  ?Problem: Safety: ?Goal: Ability to remain free from injury will improve ?Outcome: Progressing ?  ?Problem: Skin Integrity: ?Goal: Risk for impaired skin integrity will decrease ?Outcome: Progressing ?  ?

## 2022-12-17 NOTE — Progress Notes (Signed)
Progress Note   Patient: Gary [name]:[id number] DOB: 1956/01/29 DOA: 11/28/2022     17 DOS: the patient was seen and examined on 12/17/2022   Brief hospital course: Gary [name] is a 67 y.o. 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   Gary [name] stated that he ran out of his home torsemide several days ago due to a mixup with his pharmacy.  Then over the last couple days, he has noticed increasing lower extremity swelling, shortness of breath and chest pain.  He states that he usually develops chest pain when he has "fluid on his lungs."    Patient was initially diuresed with IV diuresis, responded well.  Significant improvement in peripheral edema.  IV Lasix was switched with home torsemide once creatinine started increasing.  Patient remained stable from respiratory standpoint point and remained on 6 to 8 L of supplemental oxygen via trach collar which is his baseline.  Patient developed Pseudomonas tracheitis, which was treated with Levaquin as he developed some pain around his tracheostomy.  Patient condition has stabilized, pending long-term care placement.    Principal Problem:   Acute on chronic diastolic CHF (congestive heart failure) ([name]) Active Problems:   Chronic respiratory failure with hypoxia and hypercapnia ([name])   Chest pain   COPD (chronic obstructive pulmonary disease) ([name])   Chronic kidney disease, stage 3a ([name])   Hypotension   OSA (obstructive sleep apnea)   Morbid obesity with BMI of 50.0-59.9, adult ([name])   Chronic colitis   Fluid overload   Assessment and Plan: * Acute on chronic diastolic CHF (congestive heart failure) ([name]) Patient is presenting with increased work of breathing, shortness of breath and evidence of  hypervolemia on examination.  CXR showed underinflation with bilateral areas of scarring or atelectatic change, and vascular congestion. Recent echocardiogram in July 2024 with preserved EF at 55-60% and grade 1 diastolic dysfunction. Patient condition improved after initial treatment with IV furosemide, currently on oral torsemide..   Chronic respiratory failure with hypoxia and hypercapnia ([name]) Is on 6 to 8 L of oxygen over trach collar, stable.  Pseudomonas tracheitis Patient currently symptomatic reporting pain and tenderness with light manipulation of tracheostomy.  He is chronically colonized but reports unusual symptoms. Patient has completed Levaquin. -ENT consult -no further recommendations    Severe COPD (chronic obstructive pulmonary disease) ([name]) Severe COPD on triple bronchodilator.  Current shortness of breath most likely secondary to heart failure, rather than COPD though. --home Pulmicort neb d/c'ed by pulm.  Chronic home prednisone 10 mg daily was reduced to 5 mg daily by pulm.    Chronic kidney disease, stage 3a ([name]) --Creatinine improved after decreasing the dose of torsemide --monitor   Hypotension Blood pressure again soft, his home midodrine was initially discontinued when blood pressure remained little elevated. -Restart low-dose midodrine at 2.5 mg 3 times daily  OSA (obstructive sleep apnea) --cont home vent for night-time use   Chronic colitis - Continue home mesalamine   Morbid obesity with BMI of 50.0-59.9, adult [name] has a history of morbid obesity with a BMI of 52, complicating his current medical care.   Chest pain, ACS ruled out Noncardiac in nature.   Anxiety Continue hydroxyzine as needed. Stress   Subjective:  Patient was seen and examined today.  No new concern.  Physical Exam: Vitals:   12/16/22 1656 12/16/22 2026 12/17/22 0015 12/17/22 0830  BP: 103/70  (!) 95/54 (!) 95/48  Pulse: 85  84 83  Resp: 20  20 18   Temp:  98.1 F (36.7 C)  98.1 F (36.7 C) 98.4 F (36.9 C)  TempSrc:    Oral  SpO2: 90% 94% 96% 91%  Weight:      Height:       General.  Morbidly obese gentleman, in no acute distress.  Trach collar in place Pulmonary.  Lungs clear bilaterally, normal respiratory effort. CV.  Regular rate and rhythm, no JVD, rub or murmur. Abdomen.  Soft, nontender, nondistended, BS positive. CNS.  Alert and oriented .  No focal neurologic deficit. Extremities.  1+ LE edema, no cyanosis, pulses intact and symmetrical. Psychiatry.  Judgment and insight appears normal. .    Data Reviewed:  Lab results reviewed.  Family Communication: None  Disposition: Status is: Inpatient Remains inpatient appropriate because: Unsafe discharge option.  DVT prophylaxis.  Eliquis Time spent: 39 minutes  This record has been created using Conservation officer, historic buildings. Errors have been sought and corrected,but may not always be located. Such creation errors do not reflect on the standard of care.   Author: Arnetha Courser, MD 12/17/2022 2:45 PM  For on call review [email or web address].

## 2022-12-18 DIAGNOSIS — J441 Chronic obstructive pulmonary disease with (acute) exacerbation: Secondary | ICD-10-CM | POA: Diagnosis not present

## 2022-12-18 DIAGNOSIS — Z6841 Body Mass Index (BMI) 40.0 and over, adult: Secondary | ICD-10-CM | POA: Diagnosis not present

## 2022-12-18 DIAGNOSIS — J449 Chronic obstructive pulmonary disease, unspecified: Secondary | ICD-10-CM | POA: Diagnosis not present

## 2022-12-18 DIAGNOSIS — R079 Chest pain, unspecified: Secondary | ICD-10-CM | POA: Diagnosis not present

## 2022-12-18 DIAGNOSIS — J9612 Chronic respiratory failure with hypercapnia: Secondary | ICD-10-CM | POA: Diagnosis not present

## 2022-12-18 DIAGNOSIS — J9602 Acute respiratory failure with hypercapnia: Secondary | ICD-10-CM | POA: Diagnosis not present

## 2022-12-18 DIAGNOSIS — J9611 Chronic respiratory failure with hypoxia: Secondary | ICD-10-CM | POA: Diagnosis not present

## 2022-12-18 DIAGNOSIS — E8770 Fluid overload, unspecified: Secondary | ICD-10-CM | POA: Diagnosis not present

## 2022-12-18 DIAGNOSIS — I5033 Acute on chronic diastolic (congestive) heart failure: Secondary | ICD-10-CM | POA: Diagnosis not present

## 2022-12-18 DIAGNOSIS — J9601 Acute respiratory failure with hypoxia: Secondary | ICD-10-CM | POA: Diagnosis not present

## 2022-12-18 MED ORDER — TORSEMIDE 20 MG PO TABS
60.0000 mg | ORAL_TABLET | Freq: Every day | ORAL | Status: DC
  Administered 2022-12-19: 60 mg via ORAL
  Filled 2022-12-18: qty 3

## 2022-12-18 NOTE — TOC Progression Note (Addendum)
Transition of Care [facility]) - Progression Note    Patient Details  Name: Gary [name] MRN: [id number] Date of Birth: Jul 22, 1955  Transition of Care [facility]) CM/SW Contact  Garret Reddish, RN Phone Number: 12/18/2022, 3:23 PM  Clinical Narrative:   Informed by [facility] team member [name] that she received message from [facility] that their wait limit for patients are 350 Lbs.  They are unable to accept patient due to his weight.    TOC will continue to assist with discharge planning.      Expected Discharge Plan: Skilled Nursing Facility Barriers to Discharge: Other (must enter comment) (Attempting to find SNF or LTC options as patient stated he cannot care for himself.)  Expected Discharge Plan and Services                                               Social Determinants of Health (SDOH) Interventions SDOH Screenings   Food Insecurity: No Food Insecurity (12/01/2022)  Housing: Patient Declined (12/01/2022)  Transportation Needs: No Transportation Needs (12/01/2022)  Utilities: Not At Risk (12/01/2022)  Depression (PHQ2-9): Low Risk  (06/07/2021)  Financial Resource Strain: High Risk (11/21/2022)   Received from [facility]  Physical Activity: Insufficiently Active (05/03/2017)  Social Connections: Patient Declined (11/20/2022)   Received from [facility]  Stress: Stress Concern Present (11/20/2022)   Received from [facility]  Tobacco Use: Medium Risk (12/01/2022)    Readmission Risk Interventions    11/05/2022    2:59 PM 11/05/2022   12:16 PM 12/31/2021    8:58 AM  Readmission Risk Prevention Plan  Transportation Screening Complete Complete Complete  Medication Review Ocean[employer]) Complete Complete Complete  PCP or Specialist appointment within 3-5 days of discharge Complete Complete Complete  HRI or Home Care Consult Complete Complete Complete  SW Recovery Care/Counseling Consult Complete Complete Complete  Palliative Care Screening Complete Complete  Complete  Skilled Nursing Facility Not Applicable Not Applicable Not Applicable

## 2022-12-18 NOTE — Progress Notes (Signed)
Physical Therapy Treatment Patient Details Name: Gary [name] MRN: [id number] DOB: [birth year] Today's Date: 12/18/2022   History of Present Illness 67 y/o male presented to [facility] ED on 11/28/22 for chest pain x 4 days. Admitted for acute on chronic CHF. Frequent admissions this year with most recent dsicharge on 11/21/22. PMH includes obesity, hypoxia, respiratory failure with tracheostomy, COPD, GIB, HFpEF.    PT Comments  Pt asked for pm session.  Arrived after lunch for co-tx with OT.  Pt with some increased anxiety today.  He has asked for room change to allow him to not feel so isolated and has opted for private room he can see into the hallway better. Concerned over items being left and lost.  He is assisted to EOB with min a x 1 with pt directing care for transition and throughout session.  Steady on EOB.  Transitioned to portable tank.  He is encouraged to stand from 61' bed height as he did yesterday but he refused and directed bed to be at 26" where he stands with ease and supervision.  Does not want and hands on assist with gait "You throw me off balance" so supervision with chair follow is given.  He fatigued after 11' and asks to sit in chair.  Returned to room so pt could supervise nursing staff moving his room per his request.   If plan is discharge home, recommend the following: A little help with walking and/or transfers;A lot of help with bathing/dressing/bathroom;Assist for transportation   Can travel by private vehicle        Equipment Recommendations  Other (comment) (Defer to next level of care)    Recommendations for Other Services       Precautions / Restrictions Precautions Precautions: Fall Precaution Comments: trach collar/ Trach (5 L in rm - 6L when ambulating) Restrictions Weight Bearing Restrictions: No RLE Weight Bearing: Weight bearing as tolerated     Mobility  Bed Mobility Overal bed mobility: Needs Assistance Bed Mobility: Supine to Sit     Supine  to sit: Min assist, Used rails          Transfers Overall transfer level: Needs assistance Equipment used: Rolling walker (2 wheels) Transfers: Sit to/from Stand Sit to Stand: Contact guard assist, From elevated surface           General transfer comment: refused bed height at 29' like yesterday.  bed set at 2' per his demand    Ambulation/Gait Ambulation/Gait assistance: Supervision Gait Distance (Feet): 60 Feet Assistive device: Rolling walker (2 wheels) Gait Pattern/deviations: Step-through pattern, Wide base of support Gait velocity: decreased     General Gait Details: 6 LPM trach collar   Stairs             Wheelchair Mobility     Tilt Bed    Modified Rankin (Stroke Patients Only)       Balance Overall balance assessment: Needs assistance Sitting-balance support: Feet supported, No upper extremity supported Sitting balance-[name] Scale: Good     Standing balance support: Bilateral upper extremity supported, During functional activity, Reliant on assistive device for balance Standing balance-[name] Scale: Fair Standing balance comment: Reliant on RW/AD for all standing activity.                            Cognition Arousal: Alert Behavior During Therapy: WFL for tasks assessed/performed, Anxious Overall Cognitive Status: Within Functional Limits for tasks assessed  General Comments: anxious over room change which he requested.        Exercises      General Comments        Pertinent Vitals/Pain Pain Assessment Pain Assessment: No/denies pain    Home Living                          Prior Function            PT Goals (current goals can now be found in the care plan section) Progress towards PT goals: Progressing toward goals    Frequency    Min 1X/week      PT Plan Current plan remains appropriate    Co-evaluation PT/OT/SLP Co-Evaluation/Treatment:  Yes Reason for Co-Treatment: For patient/therapist safety;To address functional/ADL transfers PT goals addressed during session: Mobility/safety with mobility;Balance;Proper use of DME;Strengthening/ROM OT goals addressed during session: ADL's and self-care      AM-PAC PT "6 Clicks" Mobility   Outcome Measure  Help needed turning from your back to your side while in a flat bed without using bedrails?: A Little Help needed moving from lying on your back to sitting on the side of a flat bed without using bedrails?: A Little Help needed moving to and from a bed to a chair (including a wheelchair)?: A Little Help needed standing up from a chair using your arms (e.g., wheelchair or bedside chair)?: A Little Help needed to walk in hospital room?: A Little Help needed climbing 3-5 steps with a railing? : A Lot 6 Click Score: 17    End of Session Equipment Utilized During Treatment: Oxygen (6L o2 trach collar during gait) Activity Tolerance: Patient tolerated treatment well Patient left: in chair;with call bell/phone within reach;with family/visitor present Nurse Communication: Mobility status PT Visit Diagnosis: Muscle weakness (generalized) (M62.81);Difficulty in walking, not elsewhere classified (R26.2)     Time: 1610-9604 PT Time Calculation (min) (ACUTE ONLY): 37 min  Charges:    $Gait Training: 8-22 mins PT General Charges $$ ACUTE PT VISIT: 1 Visit                   Danielle Dess, PTA 12/18/22, 2:43 PM

## 2022-12-18 NOTE — Progress Notes (Signed)
   12/18/22 1400  Spiritual Encounters  Type of Visit Follow up  Care provided to: Patient  Referral source Patient request  Reason for visit Routine spiritual support  OnCall Visit No   Chaplain received spiritual consult for the patient for prayer. When Chaplain arrived patient was not there. Nurse informed the chaplain that the patient was being discharged.
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
## 2022-12-18 NOTE — Progress Notes (Signed)
Progress Note   Patient: DAWOUD [name] HQI:[id number] DOB: 11-11-1955 DOA: 11/28/2022     18 DOS: the patient was seen and examined on 12/18/2022   Brief hospital course: Gary [name] is a 67 y.o. 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   Mr. Lebovits stated that he ran out of his home torsemide several days ago due to a mixup with his pharmacy.  Then over the last couple days, he has noticed increasing lower extremity swelling, shortness of breath and chest pain.  He states that he usually develops chest pain when he has "fluid on his lungs."    Patient was initially diuresed with IV diuresis, responded well.  Significant improvement in peripheral edema.  IV Lasix was switched with home torsemide once creatinine started increasing.  Patient remained stable from respiratory standpoint point and remained on 6 to 8 L of supplemental oxygen via trach collar which is his baseline.  Patient developed Pseudomonas tracheitis, which was treated with Levaquin as he developed some pain around his tracheostomy.  Patient condition has stabilized, pending long-term care placement.    Principal Problem:   Acute on chronic diastolic CHF (congestive heart failure) ([name]) Active Problems:   Chronic respiratory failure with hypoxia and hypercapnia ([name])   Chest pain   COPD (chronic obstructive pulmonary disease) ([name])   Chronic kidney disease, stage 3a ([name])   Hypotension   OSA (obstructive sleep apnea)   Morbid obesity with BMI of 50.0-59.9, adult ([name])   Chronic colitis   Fluid overload   Assessment and Plan: * Acute on chronic diastolic CHF (congestive heart failure) ([name]) Patient is presenting with increased work of breathing, shortness of breath and evidence of  hypervolemia on examination.  CXR showed underinflation with bilateral areas of scarring or atelectatic change, and vascular congestion. Recent echocardiogram in July 2024 with preserved EF at 55-60% and grade 1 diastolic dysfunction. Patient condition improved after initial treatment with IV furosemide, currently on oral torsemide -Increasing the dose of home torsemide from 40 to 60 mg daily..   Chronic respiratory failure with hypoxia and hypercapnia ([name]) Is on 6 to 8 L of oxygen over trach collar, stable.  Pseudomonas tracheitis Patient currently symptomatic reporting pain and tenderness with light manipulation of tracheostomy.  He is chronically colonized but reports unusual symptoms. Patient has completed Levaquin. -ENT consult -no further recommendations    Severe COPD (chronic obstructive pulmonary disease) ([name]) Severe COPD on triple bronchodilator.  Current shortness of breath most likely secondary to heart failure, rather than COPD though. --home Pulmicort neb d/c'ed by pulm.  Chronic home prednisone 10 mg daily was reduced to 5 mg daily by pulm.    Chronic kidney disease, stage 3a ([name]) --Creatinine improved after decreasing the dose of torsemide --monitor   Hypotension Blood pressure again soft, his home midodrine was initially discontinued when blood pressure remained little elevated. -Restart low-dose midodrine at 2.5 mg 3 times daily  OSA (obstructive sleep apnea) --cont home vent for night-time use   Chronic colitis - Continue home mesalamine   Morbid obesity with BMI of 50.0-59.9, adult [facility]) Patient has a history of morbid obesity with a BMI of 52, complicating his current medical care.   Chest pain, ACS ruled out Noncardiac in nature.   Anxiety Continue hydroxyzine as needed.  Stress   Subjective:  Patient remained stable.  Slight worsening of lower extremity edema, no new concern.  Discussed about increasing the dose of torsemide from 40 to 60 mg  daily.  Physical Exam: Vitals:   12/17/22 1846 12/17/22 2023 12/18/22 0055 12/18/22 1100  BP: 113/67  133/83 99/74  Pulse: 82  80 80  Resp: 20  19 16   Temp: 97.8 F (36.6 C)  98.6 F (37 C) 98.1 F (36.7 C)  TempSrc:      SpO2: 92% 90% 99% 94%  Weight:      Height:       General.  Morbidly obese gentleman, in no acute distress.  Trach collar in place Pulmonary.  Lungs clear bilaterally, normal respiratory effort. CV.  Regular rate and rhythm, no JVD, rub or murmur. Abdomen.  Soft, nontender, nondistended, BS positive. CNS.  Alert and oriented .  No focal neurologic deficit. Extremities.  1+ LE edema, no cyanosis, pulses intact and symmetrical. Psychiatry.  Judgment and insight appears normal.     Data Reviewed:  Lab results reviewed.  Family Communication: None  Disposition: Status is: Inpatient Remains inpatient appropriate because: Unsafe discharge option.  DVT prophylaxis.  Eliquis Time spent: 40 minutes  This record has been created using Conservation officer, historic buildings. Errors have been sought and corrected,but may not always be located. Such creation errors do not reflect on the standard of care.   Author: Arnetha Courser, MD 12/18/2022 1:01 PM  For on call review [email or web address].

## 2022-12-18 NOTE — Plan of Care (Signed)

## 2022-12-18 NOTE — TOC Progression Note (Signed)
Transition of Care [facility]) - Progression Note    Patient Details  Name: Gary [name] MRN: [id number] Date of Birth: Aug 12, 1955  Transition of Care [facility]) CM/SW Contact  Marlowe Sax, RN Phone Number: 12/18/2022, 2:56 PM  Clinical Narrative:    [facility] nursing in [place], [phone]  left Nickie in Admissions a secure VM asking about the status of the referral that was faxed to them, awaiting a response   Expected Discharge Plan: Skilled Nursing Facility Barriers to Discharge: Other (must enter comment) (Attempting to find SNF or LTC options as patient stated he cannot care for himself.)  Expected Discharge Plan and Services                                               Social Determinants of Health (SDOH) Interventions SDOH Screenings   Food Insecurity: No Food Insecurity (12/01/2022)  Housing: Patient Declined (12/01/2022)  Transportation Needs: No Transportation Needs (12/01/2022)  Utilities: Not At Risk (12/01/2022)  Depression (PHQ2-9): Low Risk  (06/07/2021)  Financial Resource Strain: High Risk (11/21/2022)   Received from [facility]  Physical Activity: Insufficiently Active (05/03/2017)  Social Connections: Patient Declined (11/20/2022)   Received from [facility]  Stress: Stress Concern Present (11/20/2022)   Received from [facility]  Tobacco Use: Medium Risk (12/01/2022)    Readmission Risk Interventions    11/05/2022    2:59 PM 11/05/2022   12:16 PM 12/31/2021    8:58 AM  Readmission Risk Prevention Plan  Transportation Screening Complete Complete Complete  Medication Review Oceanographer) Complete Complete Complete  PCP or Specialist appointment within 3-5 days of discharge Complete Complete Complete  HRI or Home Care Consult Complete Complete Complete  SW Recovery Care/Counseling Consult Complete Complete Complete  Palliative Care Screening Complete Complete Complete  Skilled Nursing Facility Not Applicable Not Applicable  Not Applicable

## 2022-12-18 NOTE — Progress Notes (Signed)
Occupational Therapy Treatment Patient Details Name: Gary [name] MRN: [id number] DOB: Aug 31, 1955 Today's Date: 12/18/2022   History of present illness 67 y/o male presented to [facility] ED on 11/28/22 for chest pain x 4 days. Admitted for acute on chronic CHF. Frequent admissions this year with most recent dsicharge on 11/21/22. PMH includes obesity, hypoxia, respiratory failure with tracheostomy, COPD, GIB, HFpEF.   OT comments  Chart reviewed to date, pt greeted in bed, agreeable to OT/PT co tx that had been set up earlier in the day. Pt reports he is changing rooms, pt appears anxious about belongings being moved to his new room. Pt initially agrees to amb to new room with chair follow, however able to amb about 5' before requesting seated rest break stating "you guys rushed me", but then indicates worry over his belongings being moved to his new room "throwing him off". Education provided re: goals of treatments, continued progress however pt appears worried about belongings and room change. OT will continue to follow acutely.      If plan is discharge home, recommend the following:  A little help with walking and/or transfers;A lot of help with bathing/dressing/bathroom   Equipment Recommendations  None recommended by OT    Recommendations for Other Services      Precautions / Restrictions Precautions Precautions: Fall Precaution Comments: trach collar/ Trach (5 L in rm - 6L when ambulating) Restrictions Weight Bearing Restrictions: No RLE Weight Bearing: Weight bearing as tolerated       Mobility Bed Mobility Overal bed mobility: Needs Assistance Bed Mobility: Supine to Sit     Supine to sit: Min assist, Used rails          Transfers Overall transfer level: Needs assistance Equipment used: Rolling walker (2 wheels) Transfers: Sit to/from Stand Sit to Stand: Contact guard assist, From elevated surface           General transfer comment: bed set to 64' per pt  request, declined to attempt from 29'     Balance Overall balance assessment: Needs assistance Sitting-balance support: Feet supported, No upper extremity supported Sitting balance-[name] Scale: Good     Standing balance support: Bilateral upper extremity supported, During functional activity, Reliant on assistive device for balance Standing balance-[name] Scale: Fair                             ADL either performed or assessed with clinical judgement   ADL Overall ADL's : Needs assistance/impaired                                       General ADL Comments: MAX A for LB dressing    Extremity/Trunk Assessment              Vision       Perception     Praxis      Cognition Arousal: Alert Behavior During Therapy: WFL for tasks assessed/performed, Anxious Overall Cognitive Status: Within Functional Limits for tasks assessed                                 General Comments: anxious over room change which pt requested        Exercises      Shoulder Instructions       General Comments spo2 >89%  on 6 L via trach collar, portable o2 tank    Pertinent Vitals/ Pain       Pain Assessment Pain Assessment: No/denies pain  Home Living                                          Prior Functioning/Environment              Frequency  Min 1X/week        Progress Toward Goals  OT Goals(current goals can now be found in the care plan section)  Progress towards OT goals: Progressing toward goals     Plan Discharge plan remains appropriate;Frequency remains appropriate    Co-evaluation    PT/OT/SLP Co-Evaluation/Treatment: Yes Reason for Co-Treatment: For patient/therapist safety;To address functional/ADL transfers PT goals addressed during session: Mobility/safety with mobility;Balance;Proper use of DME;Strengthening/ROM OT goals addressed during session: ADL's and self-care      AM-PAC OT "6  Clicks" Daily Activity     Outcome Measure   Help from another person eating meals?: None Help from another person taking care of personal grooming?: A Little Help from another person toileting, which includes using toliet, bedpan, or urinal?: A Lot Help from another person bathing (including washing, rinsing, drying)?: A Lot Help from another person to put on and taking off regular upper body clothing?: A Little Help from another person to put on and taking off regular lower body clothing?: A Lot 6 Click Score: 16    End of Session Equipment Utilized During Treatment: Oxygen;Rolling walker (2 wheels)  OT Visit Diagnosis: Unsteadiness on feet (R26.81);Other abnormalities of gait and mobility (R26.89);Muscle weakness (generalized) (M62.81);Adult, failure to thrive (R62.7)   Activity Tolerance Patient tolerated treatment well   Patient Left in chair;with call bell/phone within reach   Nurse Communication Mobility status        Time: 2836-6294 OT Time Calculation (min): 37 min  Charges: OT General Charges $OT Visit: 1 Visit OT Treatments $Therapeutic Activity: 8-22 mins  Oleta Mouse, OTD OTR/L  12/18/22, 4:09 PM

## 2022-12-18 NOTE — Progress Notes (Signed)
   12/18/22 1700  Spiritual Encounters  Type of Visit Initial  Care provided to: Patient  Referral source Patient request  Reason for visit Routine spiritual support  OnCall Visit Yes  Interventions  Spiritual Care Interventions Made Established relationship of care and support;Compassionate presence;Reflective listening;Prayer;Encouragement  Spiritual Care Plan  Spiritual Care Issues Still Outstanding No further spiritual care needs at this time (see row info)   Chaplain spiritual support services remain available as the need arises.

## 2022-12-19 DIAGNOSIS — R079 Chest pain, unspecified: Secondary | ICD-10-CM | POA: Diagnosis not present

## 2022-12-19 DIAGNOSIS — Z6841 Body Mass Index (BMI) 40.0 and over, adult: Secondary | ICD-10-CM | POA: Diagnosis not present

## 2022-12-19 DIAGNOSIS — J9612 Chronic respiratory failure with hypercapnia: Secondary | ICD-10-CM | POA: Diagnosis not present

## 2022-12-19 DIAGNOSIS — I5033 Acute on chronic diastolic (congestive) heart failure: Secondary | ICD-10-CM | POA: Diagnosis not present

## 2022-12-19 DIAGNOSIS — J9611 Chronic respiratory failure with hypoxia: Secondary | ICD-10-CM | POA: Diagnosis not present

## 2022-12-19 DIAGNOSIS — J449 Chronic obstructive pulmonary disease, unspecified: Secondary | ICD-10-CM | POA: Diagnosis not present

## 2022-12-19 DIAGNOSIS — E8770 Fluid overload, unspecified: Secondary | ICD-10-CM | POA: Diagnosis not present

## 2022-12-19 LAB — BASIC METABOLIC PANEL
Anion gap: 8 (ref 5–15)
BUN: 41 mg/dL — ABNORMAL HIGH (ref 8–23)
CO2: 32 mmol/L (ref 22–32)
Calcium: 8 mg/dL — ABNORMAL LOW (ref 8.9–10.3)
Chloride: 99 mmol/L (ref 98–111)
Creatinine, Ser: 1.33 mg/dL — ABNORMAL HIGH (ref 0.61–1.24)
GFR, Estimated: 59 mL/min — ABNORMAL LOW (ref >60)
Glucose, Bld: 115 mg/dL — ABNORMAL HIGH (ref 70–99)
Potassium: 4.8 mmol/L (ref 3.5–5.1)
Sodium: 139 mmol/L (ref 135–145)

## 2022-12-19 NOTE — Progress Notes (Signed)
Progress Note   Patient: Gary [name]:[id number] DOB: January 11, 1956 DOA: 11/28/2022     19 DOS: the patient was seen and examined on 12/19/2022   Brief hospital course: Gary [name] is a 67 y.o. 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   Gary [name] stated that he ran out of his home torsemide several days ago due to a mixup with his pharmacy.  Then over the last couple days, he has noticed increasing lower extremity swelling, shortness of breath and chest pain.  He states that he usually develops chest pain when he has "fluid on his lungs."    Patient was initially diuresed with IV diuresis, responded well.  Significant improvement in peripheral edema.  IV Lasix was switched with home torsemide once creatinine started increasing.  Patient remained stable from respiratory standpoint point and remained on 6 to 8 L of supplemental oxygen via trach collar which is his baseline.  Patient developed Pseudomonas tracheitis, which was treated with Levaquin as he developed some pain around his tracheostomy.  Patient condition has stabilized, pending long-term care placement.    Principal Problem:   Acute on chronic diastolic CHF (congestive heart failure) ([name]) Active Problems:   Chronic respiratory failure with hypoxia and hypercapnia ([name])   Chest pain   COPD (chronic obstructive pulmonary disease) ([name])   Chronic kidney disease, stage 3a ([name])   Hypotension   OSA (obstructive sleep apnea)   Morbid obesity with BMI of 50.0-59.9, adult ([name])   Chronic colitis   Fluid overload   Assessment and Plan: * Acute on chronic diastolic CHF (congestive heart failure) ([name]) Patient is presenting with increased work of breathing, shortness of breath and evidence of  hypervolemia on examination.  CXR showed underinflation with bilateral areas of scarring or atelectatic change, and vascular congestion. Recent echocardiogram in July 2024 with preserved EF at 55-60% and grade 1 diastolic dysfunction. Patient condition improved after initial treatment with IV furosemide, currently on oral torsemide -Increasing the dose of home torsemide from 40 to 60 mg daily..   Chronic respiratory failure with hypoxia and hypercapnia ([name]) Is on 6 to 8 L of oxygen over trach collar, stable.  Pseudomonas tracheitis Patient currently symptomatic reporting pain and tenderness with light manipulation of tracheostomy.  He is chronically colonized but reports unusual symptoms. Patient has completed Levaquin. -ENT consult -no further recommendations    Severe COPD (chronic obstructive pulmonary disease) ([name]) Severe COPD on triple bronchodilator.  Current shortness of breath most likely secondary to heart failure, rather than COPD though. --home Pulmicort neb d/c'ed by pulm.  Chronic home prednisone 10 mg daily was reduced to 5 mg daily by pulm.    Chronic kidney disease, stage 3a ([name]) --Creatinine improved after decreasing the dose of torsemide --monitor   Hypotension Blood pressure again soft, his home midodrine was initially discontinued when blood pressure remained little elevated. -Restart low-dose midodrine at 2.5 mg 3 times daily  OSA (obstructive sleep apnea) --cont home vent for night-time use   Chronic colitis - Continue home mesalamine   Morbid obesity with BMI of 50.0-59.9, adult [facility]) Patient has a history of morbid obesity with a BMI of 52, complicating his current medical care.   Chest pain, ACS ruled out Noncardiac in nature.   Anxiety Continue hydroxyzine as needed.  Stress   Subjective:  Patient with no new concern.  Physical Exam: Vitals:   12/19/22 0718 12/19/22 0831 12/19/22 1202 12/19/22 1544  BP:  131/83 (!) 135/91 (!) 124/90  Pulse:   78 84 87  Resp:  18 18 18   Temp:  (!) 97.5 F (36.4 C)  98.1 F (36.7 C)  TempSrc:    Oral  SpO2: 100% 91% 90% 94%  Weight:      Height:       General.  Morbidly obese gentleman, in no acute distress. Pulmonary.  Lungs clear bilaterally, normal respiratory effort. CV.  Regular rate and rhythm, no JVD, rub or murmur. Abdomen.  Soft, nontender, nondistended, BS positive. CNS.  Alert and oriented .  No focal neurologic deficit. Extremities.  1+ LE edema, no cyanosis, pulses intact and symmetrical. Psychiatry.  Judgment and insight appears normal.      Data Reviewed:  Lab results reviewed.  Family Communication: None  Disposition: Status is: Inpatient Remains inpatient appropriate because: Unsafe discharge option.  DVT prophylaxis.  Eliquis Time spent: 39 minutes  This record has been created using Conservation officer, historic buildings. Errors have been sought and corrected,but may not always be located. Such creation errors do not reflect on the standard of care.   Author: Arnetha Courser, MD 12/19/2022 4:22 PM  For on call review [email or web address].

## 2022-12-20 ENCOUNTER — Inpatient Hospital Stay: Payer: Medicare HMO

## 2022-12-20 DIAGNOSIS — J9611 Chronic respiratory failure with hypoxia: Secondary | ICD-10-CM | POA: Diagnosis not present

## 2022-12-20 DIAGNOSIS — I517 Cardiomegaly: Secondary | ICD-10-CM | POA: Diagnosis not present

## 2022-12-20 DIAGNOSIS — Z6841 Body Mass Index (BMI) 40.0 and over, adult: Secondary | ICD-10-CM | POA: Diagnosis not present

## 2022-12-20 DIAGNOSIS — J9612 Chronic respiratory failure with hypercapnia: Secondary | ICD-10-CM | POA: Diagnosis not present

## 2022-12-20 DIAGNOSIS — R0602 Shortness of breath: Secondary | ICD-10-CM | POA: Diagnosis not present

## 2022-12-20 DIAGNOSIS — I5033 Acute on chronic diastolic (congestive) heart failure: Secondary | ICD-10-CM | POA: Diagnosis not present

## 2022-12-20 DIAGNOSIS — R079 Chest pain, unspecified: Secondary | ICD-10-CM | POA: Diagnosis not present

## 2022-12-20 DIAGNOSIS — J449 Chronic obstructive pulmonary disease, unspecified: Secondary | ICD-10-CM | POA: Diagnosis not present

## 2022-12-20 DIAGNOSIS — R918 Other nonspecific abnormal finding of lung field: Secondary | ICD-10-CM | POA: Diagnosis not present

## 2022-12-20 DIAGNOSIS — E8770 Fluid overload, unspecified: Secondary | ICD-10-CM | POA: Diagnosis not present

## 2022-12-20 LAB — BASIC METABOLIC PANEL
Anion gap: 9 (ref 5–15)
BUN: 44 mg/dL — ABNORMAL HIGH (ref 8–23)
CO2: 32 mmol/L (ref 22–32)
Calcium: 8.1 mg/dL — ABNORMAL LOW (ref 8.9–10.3)
Chloride: 97 mmol/L — ABNORMAL LOW (ref 98–111)
Creatinine, Ser: 1.39 mg/dL — ABNORMAL HIGH (ref 0.61–1.24)
GFR, Estimated: 56 mL/min — ABNORMAL LOW (ref >60)
Glucose, Bld: 107 mg/dL — ABNORMAL HIGH (ref 70–99)
Potassium: 4.7 mmol/L (ref 3.5–5.1)
Sodium: 138 mmol/L (ref 135–145)

## 2022-12-20 MED ORDER — TORSEMIDE 20 MG PO TABS
40.0000 mg | ORAL_TABLET | Freq: Every day | ORAL | Status: DC
  Administered 2022-12-20 – 2022-12-26 (×7): 40 mg via ORAL
  Filled 2022-12-20 (×8): qty 2

## 2022-12-20 MED ORDER — TORSEMIDE 20 MG PO TABS
40.0000 mg | ORAL_TABLET | Freq: Once | ORAL | Status: AC
  Administered 2022-12-20: 40 mg via ORAL
  Filled 2022-12-20: qty 2

## 2022-12-20 MED ORDER — FUROSEMIDE 10 MG/ML IJ SOLN
40.0000 mg | Freq: Once | INTRAMUSCULAR | Status: DC
  Filled 2022-12-20: qty 4

## 2022-12-20 NOTE — Progress Notes (Signed)
Progress Note   Patient: Gary [name] XBM:[id number] DOB: 01-19-1956 DOA: 11/28/2022     20 DOS: the patient was seen and examined on 12/20/2022   Brief hospital course: ADREON WYKES is a 67 y.o. 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   Mr. Pisano stated that he ran out of his home torsemide several days ago due to a mixup with his pharmacy.  Then over the last couple days, he has noticed increasing lower extremity swelling, shortness of breath and chest pain.  He states that he usually develops chest pain when he has "fluid on his lungs."    Patient was initially diuresed with IV diuresis, responded well.  Significant improvement in peripheral edema.  IV Lasix was switched with home torsemide once creatinine started increasing.  Patient remained stable from respiratory standpoint point and remained on 6 to 8 L of supplemental oxygen via trach collar which is his baseline.  Patient developed Pseudomonas tracheitis, which was treated with Levaquin as he developed some pain around his tracheostomy.  Patient condition has stabilized, pending long-term care placement.    Principal Problem:   Acute on chronic diastolic CHF (congestive heart failure) ([name]) Active Problems:   Chronic respiratory failure with hypoxia and hypercapnia ([name])   Chest pain   COPD (chronic obstructive pulmonary disease) ([name])   Chronic kidney disease, stage 3a ([name])   Hypotension   OSA (obstructive sleep apnea)   Morbid obesity with BMI of 50.0-59.9, adult ([name])   Chronic colitis   Fluid overload   Assessment and Plan: * Acute on chronic diastolic CHF (congestive heart failure) ([name]) Patient is presenting with increased work of breathing, shortness of breath and evidence of  hypervolemia on examination.  CXR showed underinflation with bilateral areas of scarring or atelectatic change, and vascular congestion. Recent echocardiogram in July 2024 with preserved EF at 55-60% and grade 1 diastolic dysfunction. Patient condition improved after initial treatment with IV furosemide, currently on oral torsemide -Creatinine again started trending up so backing off on torsemide to 40 mg daily dose   Chronic respiratory failure with hypoxia and hypercapnia ([name]) Is on 6 to 8 L of oxygen over trach collar, stable.  Pseudomonas tracheitis Patient currently symptomatic reporting pain and tenderness with light manipulation of tracheostomy.  He is chronically colonized but reports unusual symptoms. Patient has completed Levaquin. -ENT consult -no further recommendations    Severe COPD (chronic obstructive pulmonary disease) ([name]) Severe COPD on triple bronchodilator.  Current shortness of breath most likely secondary to heart failure, rather than COPD though. --home Pulmicort neb d/c'ed by pulm.  Chronic home prednisone 10 mg daily was reduced to 5 mg daily by pulm.    Chronic kidney disease, stage 3a ([name]) --Creatinine with slight worsening to 1.39 today --monitor   Hypotension Blood pressure again soft, his home midodrine was initially discontinued when blood pressure remained little elevated. -Restart low-dose midodrine at 2.5 mg 3 times daily  OSA (obstructive sleep apnea) --cont home vent for night-time use   Chronic colitis - Continue home mesalamine   Morbid obesity with BMI of 50.0-59.9, adult [facility]) Patient has a history of morbid obesity with a BMI of 52, complicating his current medical care.   Chest pain, ACS ruled out Noncardiac in nature.   Anxiety Continue hydroxyzine  as needed. Stress   Subjective:  Patient was resting comfortably when seen today.  No new concern.  Physical Exam: Vitals:   12/19/22 2307 12/20/22 0837 12/20/22 1206 12/20/22  1602  BP: 124/84 (!) 92/55 119/75 116/83  Pulse: 85 88 85 89  Resp: (!) 22 18  20   Temp: 98 F (36.7 C) 98 F (36.7 C)    TempSrc:      SpO2: 96% 92%  95%  Weight:      Height:       General.  Morbidly obese gentleman, in no acute distress.  Trach collar in place Pulmonary.  Lungs clear bilaterally, normal respiratory effort. CV.  Regular rate and rhythm, no JVD, rub or murmur. Abdomen.  Soft, nontender, nondistended, BS positive. CNS.  Alert and oriented .  No focal neurologic deficit. Extremities.  1+ LE edema, no cyanosis, pulses intact and symmetrical. Psychiatry.  Judgment and insight appears normal.      Data Reviewed:  Lab results reviewed.  Family Communication: None  Disposition: Status is: Inpatient Remains inpatient appropriate because: Unsafe discharge option.  DVT prophylaxis.  Eliquis Time spent: 38 minutes  This record has been created using Conservation officer, historic buildings. Errors have been sought and corrected,but may not always be located. Such creation errors do not reflect on the standard of care.   Author: Arnetha Courser, MD 12/20/2022 4:22 PM  For on call review [email or web address].

## 2022-12-20 NOTE — Progress Notes (Signed)
OT Cancellation Note  Patient Details Name: Gary [name] MRN: [id number] DOB: 1955-10-24   Cancelled Treatment:    Reason Eval/Treat Not Completed: Other (comment)Attempted to see pt 2x this afternoon, first attempt approx 1405 pt requests therapist to return at a later time, second attempt, pt with nursing receiving care. OT will follow up as able.   Oleta Mouse, OTD OTR/L  12/20/22, 3:38 PM

## 2022-12-20 NOTE — Plan of Care (Signed)

## 2022-12-20 NOTE — Progress Notes (Addendum)
Physical Therapy Treatment Patient Details Name: Gary [name] MRN: [id number] DOB: 08/13/1955 Today's Date: 12/20/2022   History of Present Illness 67 y/o male presented to [facility] ED on 11/28/22 for chest pain x 4 days. Admitted for acute on chronic CHF. Frequent admissions this year with most recent dsicharge on 11/21/22. PMH includes obesity, hypoxia, respiratory failure with tracheostomy, COPD, GIB, HFpEF.    PT Comments  Pt was long sitting in bed upon arrival. He was is awake and agreeable. Discussed at length needing to focus on transfers not ambulation this session. Pt was able to stand form overall lower surface heights but still remains elevated. Stood 2 x from 28 " and 1 x from 29 ". "I want to walk later." PT will continue to follow and progress per current POC. Pt would benefit from 24/7 assistance at DC.    If plan is discharge home, recommend the following: A little help with walking and/or transfers;A lot of help with bathing/dressing/bathroom;Assist for transportation     Equipment Recommendations  Other (comment) (pt endorses having all equipment needs but will need to confirm prior to DC)       Precautions / Restrictions Precautions Precautions: Fall Precaution Comments: trach collar/ Trach (5L in rm 6L when ambulating away from rm) Restrictions Weight Bearing Restrictions: No RLE Weight Bearing: Weight bearing as tolerated     Mobility  Bed Mobility Overal bed mobility: Needs Assistance Bed Mobility: Supine to Sit Rolling: Used rails, Supervision Supine to sit: Min assist, HOB elevated, Used rails Sit to supine: Mod assist (mod assist to progress BLEs into bed form EOB sitting)     Transfers Overall transfer level: Needs assistance Equipment used: Rolling walker (2 wheels) (Bariatric) Transfers: Sit to/from Stand Sit to Stand: Contact guard assist, From elevated surface  General transfer comment: pt was agreeable to attempting to stand form lower heights with max  encouragement. Pt stood 3 x total. 2 x from 28 " surface and 1 x from 29" surface. prolonged recovery between due to pt fatigue.    Ambulation/Gait  General Gait Details: deferred gait training for later in the day with mobility team or OT.    Balance Overall balance assessment: Needs assistance Sitting-balance support: Feet supported, No upper extremity supported Sitting balance-[name] Scale: Good     Standing balance support: Bilateral upper extremity supported, During functional activity, Reliant on assistive device for balance Standing balance-[name] Scale: Fair     Cognition Arousal: Alert Behavior During Therapy: WFL for tasks assessed/performed, Anxious (still present with fear with mobility/transfers/ gait. Likes to dictate session progression ') Overall Cognitive Status: Within Functional Limits for tasks assessed   General Comments: Pt is A and O and agreeable to session. continues to like to dictate session progression however was willing to focus on transfers versus gait           General Comments General comments (skin integrity, edema, etc.): pt was encouraged to perform previously issued HEP exercises to promote return in strength and function.      Pertinent Vitals/Pain Pain Assessment Pain Assessment: No/denies pain Pain Score: 0-No pain     PT Goals (current goals can now be found in the care plan section) Acute Rehab PT Goals Patient Stated Goal: to get stronger Progress towards PT goals: Progressing toward goals    Frequency    Min 1X/week      PT Plan Current plan remains appropriate    Co-evaluation     PT goals addressed during session: Mobility/safety  with mobility;Balance;Proper use of DME;Strengthening/ROM        AM-PAC PT "6 Clicks" Mobility   Outcome Measure  Help needed turning from your back to your side while in a flat bed without using bedrails?: A Little Help needed moving from lying on your back to sitting on the side of a  flat bed without using bedrails?: A Little Help needed moving to and from a bed to a chair (including a wheelchair)?: A Little Help needed standing up from a chair using your arms (e.g., wheelchair or bedside chair)?: A Little Help needed to walk in hospital room?: A Little Help needed climbing 3-5 steps with a railing? : A Lot 6 Click Score: 17    End of Session Equipment Utilized During Treatment: Oxygen (5 L trach collar) Activity Tolerance: Patient tolerated treatment well;Patient limited by fatigue Patient left: in bed;with call bell/phone within reach;with bed alarm set Nurse Communication: Mobility status PT Visit Diagnosis: Muscle weakness (generalized) (M62.81);Difficulty in walking, not elsewhere classified (R26.2)     Time: 1610-9604 PT Time Calculation (min) (ACUTE ONLY): 32 min  Charges:    $Therapeutic Activity: 23-37 mins PT General Charges $$ ACUTE PT VISIT: 1 Visit                     Jetta Lout PTA 12/20/22, 1:07 PM

## 2022-12-21 DIAGNOSIS — J9611 Chronic respiratory failure with hypoxia: Secondary | ICD-10-CM | POA: Diagnosis not present

## 2022-12-21 DIAGNOSIS — J9602 Acute respiratory failure with hypercapnia: Secondary | ICD-10-CM | POA: Diagnosis not present

## 2022-12-21 DIAGNOSIS — R079 Chest pain, unspecified: Secondary | ICD-10-CM | POA: Diagnosis not present

## 2022-12-21 DIAGNOSIS — E8770 Fluid overload, unspecified: Secondary | ICD-10-CM | POA: Diagnosis not present

## 2022-12-21 DIAGNOSIS — J441 Chronic obstructive pulmonary disease with (acute) exacerbation: Secondary | ICD-10-CM | POA: Diagnosis not present

## 2022-12-21 DIAGNOSIS — Z6841 Body Mass Index (BMI) 40.0 and over, adult: Secondary | ICD-10-CM | POA: Diagnosis not present

## 2022-12-21 DIAGNOSIS — J9601 Acute respiratory failure with hypoxia: Secondary | ICD-10-CM | POA: Diagnosis not present

## 2022-12-21 DIAGNOSIS — I5033 Acute on chronic diastolic (congestive) heart failure: Secondary | ICD-10-CM | POA: Diagnosis not present

## 2022-12-21 DIAGNOSIS — J449 Chronic obstructive pulmonary disease, unspecified: Secondary | ICD-10-CM | POA: Diagnosis not present

## 2022-12-21 DIAGNOSIS — J9612 Chronic respiratory failure with hypercapnia: Secondary | ICD-10-CM | POA: Diagnosis not present

## 2022-12-21 LAB — BASIC METABOLIC PANEL
Anion gap: 9 (ref 5–15)
BUN: 43 mg/dL — ABNORMAL HIGH (ref 8–23)
CO2: 32 mmol/L (ref 22–32)
Calcium: 7.9 mg/dL — ABNORMAL LOW (ref 8.9–10.3)
Chloride: 97 mmol/L — ABNORMAL LOW (ref 98–111)
Creatinine, Ser: 1.42 mg/dL — ABNORMAL HIGH (ref 0.61–1.24)
GFR, Estimated: 55 mL/min — ABNORMAL LOW (ref >60)
Glucose, Bld: 190 mg/dL — ABNORMAL HIGH (ref 70–99)
Potassium: 3.8 mmol/L (ref 3.5–5.1)
Sodium: 138 mmol/L (ref 135–145)

## 2022-12-21 LAB — CBC
HCT: 38.5 % — ABNORMAL LOW (ref 39.0–52.0)
Hemoglobin: 10.1 g/dL — ABNORMAL LOW (ref 13.0–17.0)
MCH: 21 pg — ABNORMAL LOW (ref 26.0–34.0)
MCHC: 26.2 g/dL — ABNORMAL LOW (ref 30.0–36.0)
MCV: 80.2 fL (ref 80.0–100.0)
Platelets: 265 10*3/uL (ref 150–400)
RBC: 4.8 MIL/uL (ref 4.22–5.81)
RDW: 18.4 % — ABNORMAL HIGH (ref 11.5–15.5)
WBC: 7.4 10*3/uL (ref 4.0–10.5)
nRBC: 0.5 % — ABNORMAL HIGH (ref 0.0–0.2)

## 2022-12-21 NOTE — Progress Notes (Signed)
PT Cancellation Note  Patient Details Name: Gary [name] MRN: [id number] DOB: 07-22-1955   Cancelled Treatment:     PT attempt. Pt currently on home/trilogy vent support and states," I am having a bad breathing day. I'm not doing it today but can you check on me tomorrow?" Acute PT will continue to follow per current POC.Pt will most likely not be seen by PT over weekend.    [name] 12/21/2022, 1:02 PM
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
## 2022-12-21 NOTE — Progress Notes (Signed)
Progress Note   Patient: Gary [name] ZOX:[id number] DOB: 29-Oct-1955 DOA: 11/28/2022     21 DOS: the patient was seen and examined on 12/21/2022   Brief hospital course: Gary [name] is a 67 y.o. 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   Gary [name] stated that he ran out of his home torsemide several days ago due to a mixup with his pharmacy.  Then over the last couple days, he has noticed increasing lower extremity swelling, shortness of breath and chest pain.  He states that he usually develops chest pain when he has "fluid on his lungs."    Patient was initially diuresed with IV diuresis, responded well.  Significant improvement in peripheral edema.  IV Lasix was switched with home torsemide once creatinine started increasing.  Patient remained stable from respiratory standpoint point and remained on 6 to 8 L of supplemental oxygen via trach collar which is his baseline.  Patient developed Pseudomonas tracheitis, which was treated with Levaquin as he developed some pain around his tracheostomy.  Patient condition has stabilized, pending long-term care placement.    Principal Problem:   Acute on chronic diastolic CHF (congestive heart failure) ([name]) Active Problems:   Chronic respiratory failure with hypoxia and hypercapnia ([name])   Chest pain   COPD (chronic obstructive pulmonary disease) ([name])   Chronic kidney disease, stage 3a ([name])   Hypotension   OSA (obstructive sleep apnea)   Morbid obesity with BMI of 50.0-59.9, adult ([name])   Chronic colitis   Fluid overload   Assessment and Plan: * Acute on chronic diastolic CHF (congestive heart failure) ([name]) Patient is presenting with increased work of breathing, shortness of breath and evidence of  hypervolemia on examination.  CXR showed underinflation with bilateral areas of scarring or atelectatic change, and vascular congestion. Recent echocardiogram in July 2024 with preserved EF at 55-60% and grade 1 diastolic dysfunction. Patient condition improved after initial treatment with IV furosemide, currently on oral torsemide -Creatinine again started trending up so backing off on torsemide to 40 mg daily dose -Patient did receive an extra dose yesterday for his complaint of shortness of breath. -Will continue with 40 mg at this time and monitoring renal function   Chronic respiratory failure with hypoxia and hypercapnia ([name]) Is on 6 to 8 L of oxygen over trach collar, stable.  Pseudomonas tracheitis Patient currently symptomatic reporting pain and tenderness with light manipulation of tracheostomy.  He is chronically colonized but reports unusual symptoms. Patient has completed Levaquin. -ENT consult -no further recommendations    Severe COPD (chronic obstructive pulmonary disease) ([name]) Severe COPD on triple bronchodilator.  Current shortness of breath most likely secondary to heart failure, rather than COPD though. --home Pulmicort neb d/c'ed by pulm.  Chronic home prednisone 10 mg daily was reduced to 5 mg daily by pulm.    Chronic kidney disease, stage 3a ([name]) --Creatinine with slight worsening to 1.39 today --monitor   Hypotension Blood pressure again soft, his home midodrine was initially discontinued when blood pressure remained little elevated. -Restart low-dose midodrine at 2.5 mg 3 times daily  OSA (obstructive sleep apnea) --cont home vent for night-time use   Chronic colitis - Continue home mesalamine   Morbid obesity with BMI of 50.0-59.9, adult [facility]) Patient has a history of morbid  obesity with a BMI of 52, complicating his current medical care.   Chest pain, ACS ruled out Noncardiac in nature.   Anxiety Continue hydroxyzine as  needed. Stress   Subjective:  Patient was seen and examined today.  No new concern at this time.  Physical Exam: Vitals:   12/21/22 0357 12/21/22 0758 12/21/22 0835 12/21/22 1223  BP:   91/67 109/71  Pulse:   87 77  Resp:   18   Temp:      TempSrc:      SpO2: 96% 94% 91%   Weight:      Height:       General.  Morbidly obese gentleman, in no acute distress.  Trach in place Pulmonary.  Lungs clear bilaterally, normal respiratory effort. CV.  Regular rate and rhythm, no JVD, rub or murmur. Abdomen.  Soft, nontender, nondistended, BS positive. CNS.  Alert and oriented .  No focal neurologic deficit. Extremities.  Trace LE edema, no cyanosis, pulses intact and symmetrical. Psychiatry.  Judgment and insight appears normal.    Data Reviewed:  Lab results reviewed.  Family Communication: None  Disposition: Status is: Inpatient Remains inpatient appropriate because: Unsafe discharge option.  DVT prophylaxis.  Eliquis Time spent: 39 minutes  This record has been created using Conservation officer, historic buildings. Errors have been sought and corrected,but may not always be located. Such creation errors do not reflect on the standard of care.   Author: Arnetha Courser, MD 12/21/2022 2:06 PM  For on call review [email or web address].

## 2022-12-21 NOTE — TOC Progression Note (Signed)
Transition of Care [facility]) - Progression Note    Patient Details  Name: Gary [name] MRN: [id number] Date of Birth: Jul 12, 1955  Transition of Care [facility]) CM/SW Contact  Marlowe Sax, RN Phone Number: 12/21/2022, 2:27 PM  Clinical Narrative:    No potential LTC facilities I contacted Gary [name] a placement agency to seek assistance in placing patient She stated that she does not have any ability to place a Trach patient with this weight limit She will check and see what she can do to help and let me know    Expected Discharge Plan: Skilled Nursing Facility Barriers to Discharge: Other (must enter comment) (Attempting to find SNF or LTC options as patient stated he cannot care for himself.)  Expected Discharge Plan and Services                                               Social Determinants of Health (SDOH) Interventions SDOH Screenings   Food Insecurity: No Food Insecurity (12/01/2022)  Housing: Patient Declined (12/01/2022)  Transportation Needs: No Transportation Needs (12/01/2022)  Utilities: Not At Risk (12/01/2022)  Depression (PHQ2-9): Low Risk  (06/07/2021)  Financial Resource Strain: High Risk (11/21/2022)   Received from [facility]  Physical Activity: Insufficiently Active (05/03/2017)  Social Connections: Patient Declined (11/20/2022)   Received from [facility]  Stress: Stress Concern Present (11/20/2022)   Received from [facility]  Tobacco Use: Medium Risk (12/01/2022)    Readmission Risk Interventions    11/05/2022    2:59 PM 11/05/2022   12:16 PM 12/31/2021    8:58 AM  Readmission Risk Prevention Plan  Transportation Screening Complete Complete Complete  Medication Review Oceanographer) Complete Complete Complete  PCP or Specialist appointment within 3-5 days of discharge Complete Complete Complete  HRI or Home Care Consult Complete Complete Complete  SW Recovery Care/Counseling Consult Complete Complete Complete  Palliative Care  Screening Complete Complete Complete  Skilled Nursing Facility Not Applicable Not Applicable Not Applicable

## 2022-12-22 DIAGNOSIS — E8770 Fluid overload, unspecified: Secondary | ICD-10-CM | POA: Diagnosis not present

## 2022-12-22 DIAGNOSIS — R079 Chest pain, unspecified: Secondary | ICD-10-CM | POA: Diagnosis not present

## 2022-12-22 DIAGNOSIS — J9612 Chronic respiratory failure with hypercapnia: Secondary | ICD-10-CM | POA: Diagnosis not present

## 2022-12-22 DIAGNOSIS — J9611 Chronic respiratory failure with hypoxia: Secondary | ICD-10-CM | POA: Diagnosis not present

## 2022-12-22 DIAGNOSIS — J449 Chronic obstructive pulmonary disease, unspecified: Secondary | ICD-10-CM | POA: Diagnosis not present

## 2022-12-22 DIAGNOSIS — Z6841 Body Mass Index (BMI) 40.0 and over, adult: Secondary | ICD-10-CM | POA: Diagnosis not present

## 2022-12-22 DIAGNOSIS — I5033 Acute on chronic diastolic (congestive) heart failure: Secondary | ICD-10-CM | POA: Diagnosis not present

## 2022-12-22 LAB — BASIC METABOLIC PANEL
Anion gap: 9 (ref 5–15)
BUN: 42 mg/dL — ABNORMAL HIGH (ref 8–23)
CO2: 32 mmol/L (ref 22–32)
Calcium: 8.1 mg/dL — ABNORMAL LOW (ref 8.9–10.3)
Chloride: 100 mmol/L (ref 98–111)
Creatinine, Ser: 1.39 mg/dL — ABNORMAL HIGH (ref 0.61–1.24)
GFR, Estimated: 56 mL/min — ABNORMAL LOW (ref >60)
Glucose, Bld: 92 mg/dL (ref 70–99)
Potassium: 4.3 mmol/L (ref 3.5–5.1)
Sodium: 141 mmol/L (ref 135–145)

## 2022-12-22 NOTE — Plan of Care (Signed)

## 2022-12-22 NOTE — Progress Notes (Signed)
Mobility Specialist - Progress Note   12/22/22 1504  Mobility  Activity Stood at bedside;Ambulated with assistance in room;Dangled on edge of bed  Level of Assistance Contact guard assist, steadying assist (+2 for safety)  Assistive Device Front wheel walker  Distance Ambulated (ft) 3 ft  Activity Response Tolerated well  Mobility Referral Yes  $Mobility charge 1 Mobility  Mobility Specialist Start Time (ACUTE ONLY) 0144  Mobility Specialist Stop Time (ACUTE ONLY) 0213  Mobility Specialist Time Calculation (min) (ACUTE ONLY) 29 min   Pt semi-supine in bed upon arrival. Pt completes bed mobility Supervision with extra time. Pt able to maintain balance EOB for 3~5 minutes indep. Pt STS and ambulates 2 steps forward and 2 steps backwards CGA with no LOB noted. Pt returns to supine with needs in reach.  [name]  Mobility Specialist  12/22/22 3:06 PM

## 2022-12-22 NOTE — Progress Notes (Signed)
Progress Note   Patient: Gary [name] UEA:[id number] DOB: 1955-11-20 DOA: 11/28/2022     22 DOS: the patient was seen and examined on 12/22/2022   Brief hospital course: Gary [name] is a 67 y.o. 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   [name] stated that he ran out of his home torsemide several days ago due to a mixup with his pharmacy.  Then over the last couple days, he has noticed increasing lower extremity swelling, shortness of breath and chest pain.  He states that he usually develops chest pain when he has "fluid on his lungs."    Patient was initially diuresed with IV diuresis, responded well.  Significant improvement in peripheral edema.  IV Lasix was switched with home torsemide once creatinine started increasing.  Patient remained stable from respiratory standpoint point and remained on 6 to 8 L of supplemental oxygen via trach collar which is his baseline.  Patient developed Pseudomonas tracheitis, which was treated with Levaquin as he developed some pain around his tracheostomy.  Patient condition has stabilized, pending long-term care placement.    Principal Problem:   Acute on chronic diastolic CHF (congestive heart failure) ([name]) Active Problems:   Chronic respiratory failure with hypoxia and hypercapnia ([name])   Chest pain   COPD (chronic obstructive pulmonary disease) ([name])   Chronic kidney disease, stage 3a ([name])   Hypotension   OSA (obstructive sleep apnea)   Morbid obesity with BMI of 50.0-59.9, adult ([name])   Chronic colitis   Fluid overload   Assessment and Plan: * Acute on chronic diastolic CHF (congestive heart failure) ([name]) Patient is presenting with increased work of breathing, shortness of breath and evidence of  hypervolemia on examination.  CXR showed underinflation with bilateral areas of scarring or atelectatic change, and vascular congestion. Recent echocardiogram in July 2024 with preserved EF at 55-60% and grade 1 diastolic dysfunction. Patient condition improved after initial treatment with IV furosemide, currently on oral torsemide -Creatinine again started trending up so backing off on torsemide to 40 mg daily dose -Patient did receive an extra dose yesterday for his complaint of shortness of breath. -Will continue with 40 mg at this time and monitoring renal function   Chronic respiratory failure with hypoxia and hypercapnia ([name]) Is on 6 to 8 L of oxygen over trach collar, stable.  Pseudomonas tracheitis Patient currently symptomatic reporting pain and tenderness with light manipulation of tracheostomy.  He is chronically colonized but reports unusual symptoms. Patient has completed Levaquin. -ENT consult -no further recommendations    Severe COPD (chronic obstructive pulmonary disease) ([name]) Severe COPD on triple bronchodilator.  Current shortness of breath most likely secondary to heart failure, rather than COPD though. --home Pulmicort neb d/c'ed by pulm.  Chronic home prednisone 10 mg daily was reduced to 5 mg daily by pulm.    Chronic kidney disease, stage 3a ([name]) --Creatinine with slight worsening to 1.39 today --monitor   Hypotension Blood pressure again soft, his home midodrine was initially discontinued when blood pressure remained little elevated. -Restart low-dose midodrine at 2.5 mg 3 times daily  OSA (obstructive sleep apnea) --cont home vent for night-time use   Chronic colitis - Continue home mesalamine   Morbid obesity with BMI of 50.0-59.9, adult [facility]) Patient has a history of morbid  obesity with a BMI of 52, complicating his current medical care.   Chest pain, ACS ruled out Noncardiac in nature.   Anxiety Continue hydroxyzine as  needed. Stress   Subjective:  Patient with no new concerns today.  Awaiting long-term placement.  Physical Exam: Vitals:   12/21/22 1710 12/21/22 2352 12/22/22 0014 12/22/22 0933  BP: 101/86 113/70  100/64  Pulse: 79 84  78  Resp: 14 20  18   Temp: 98.5 F (36.9 C) 98.5 F (36.9 C)  98.4 F (36.9 C)  TempSrc:    Oral  SpO2: 94% 94% 94% 90%  Weight:      Height:       General.  Morbidly obese gentleman, in no acute distress.  Trach collar in place Pulmonary.  Lungs clear bilaterally, normal respiratory effort. CV.  Regular rate and rhythm, no JVD, rub or murmur. Abdomen.  Soft, nontender, nondistended, BS positive. CNS.  Alert and oriented .  No focal neurologic deficit. Extremities.  Trace LE edema, no cyanosis, pulses intact and symmetrical. Psychiatry.  Judgment and insight appears normal.     Data Reviewed: Prior data reviewed  Family Communication: None  Disposition: Status is: Inpatient Remains inpatient appropriate because: Unsafe discharge option.  DVT prophylaxis.  Eliquis Time spent: 40 minutes  This record has been created using Conservation officer, historic buildings. Errors have been sought and corrected,but may not always be located. Such creation errors do not reflect on the standard of care.   Author: Arnetha Courser, MD 12/22/2022 2:57 PM  For on call review [email or web address].

## 2022-12-22 NOTE — Plan of Care (Signed)
  Problem: Education: Goal: Knowledge of General Education information will improve Description: Including pain rating scale, medication(s)/side effects and non-pharmacologic comfort measures Outcome: Progressing   Problem: Health Behavior/Discharge Planning: Goal: Ability to manage health-related needs will improve Outcome: Progressing   Problem: Clinical Measurements: Goal: Ability to maintain clinical measurements within normal limits will improve Outcome: Progressing Goal: Will remain free from infection Outcome: Progressing   Problem: Activity: Goal: Risk for activity intolerance will decrease Outcome: Progressing   Problem: Nutrition: Goal: Adequate nutrition will be maintained Outcome: Progressing   Problem: Pain Managment: Goal: General experience of comfort will improve Outcome: Progressing   

## 2022-12-23 DIAGNOSIS — J449 Chronic obstructive pulmonary disease, unspecified: Secondary | ICD-10-CM | POA: Diagnosis not present

## 2022-12-23 DIAGNOSIS — R079 Chest pain, unspecified: Secondary | ICD-10-CM | POA: Diagnosis not present

## 2022-12-23 DIAGNOSIS — I5033 Acute on chronic diastolic (congestive) heart failure: Secondary | ICD-10-CM | POA: Diagnosis not present

## 2022-12-23 DIAGNOSIS — J9611 Chronic respiratory failure with hypoxia: Secondary | ICD-10-CM | POA: Diagnosis not present

## 2022-12-23 DIAGNOSIS — Z6841 Body Mass Index (BMI) 40.0 and over, adult: Secondary | ICD-10-CM | POA: Diagnosis not present

## 2022-12-23 DIAGNOSIS — E8770 Fluid overload, unspecified: Secondary | ICD-10-CM | POA: Diagnosis not present

## 2022-12-23 DIAGNOSIS — J9612 Chronic respiratory failure with hypercapnia: Secondary | ICD-10-CM | POA: Diagnosis not present

## 2022-12-23 NOTE — Progress Notes (Signed)
Mobility Specialist - Progress Note   12/23/22 1125  Mobility  Activity Refused mobility   Pt defers mobility this date, will attempt again at another date and time.   [name]  Mobility Specialist  12/23/22 11:26 AM

## 2022-12-23 NOTE — Plan of Care (Signed)

## 2022-12-23 NOTE — Progress Notes (Signed)
Progress Note   Patient: Gary [name]:[id number] DOB: [birth year] DOA: 11/28/2022     23 DOS: the patient was seen and examined on 12/23/2022   Brief hospital course: Gary [name] is a 68 y.o. 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   Gary [name] stated that he ran out of his home torsemide several days ago due to a mixup with his pharmacy.  Then over the last couple days, he has noticed increasing lower extremity swelling, shortness of breath and chest pain.  He states that he usually develops chest pain when he has "fluid on his lungs."    Patient was initially diuresed with IV diuresis, responded well.  Significant improvement in peripheral edema.  IV Lasix was switched with home torsemide once creatinine started increasing.  Patient remained stable from respiratory standpoint point and remained on 6 to 8 L of supplemental oxygen via trach collar which is his baseline.  Patient developed Pseudomonas tracheitis, which was treated with Levaquin as he developed some pain around his tracheostomy.  Patient condition has stabilized, pending long-term care placement.    Principal Problem:   Acute on chronic diastolic CHF (congestive heart failure) ([name]) Active Problems:   Chronic respiratory failure with hypoxia and hypercapnia ([name])   Chest pain   COPD (chronic obstructive pulmonary disease) ([name])   Chronic kidney disease, stage 3a ([name])   Hypotension   OSA (obstructive sleep apnea)   Morbid obesity with BMI of 50.0-59.9, adult ([name])   Chronic colitis   Fluid overload   Assessment and Plan: * Acute on chronic diastolic CHF (congestive heart failure) ([name]) Patient is presenting with increased work of breathing, shortness of breath and evidence of  hypervolemia on examination.  CXR showed underinflation with bilateral areas of scarring or atelectatic change, and vascular congestion. Recent echocardiogram in July 2024 with preserved EF at 55-60% and grade 1 diastolic dysfunction. Patient condition improved after initial treatment with IV furosemide, currently on oral torsemide -Creatinine again started trending up so backing off on torsemide to 40 mg daily dose -Patient did receive an extra dose yesterday for his complaint of shortness of breath. -Will continue with 40 mg at this time and monitoring renal function   Chronic respiratory failure with hypoxia and hypercapnia ([name]) Is on 6 to 8 L of oxygen over trach collar, stable.  Pseudomonas tracheitis Patient currently symptomatic reporting pain and tenderness with light manipulation of tracheostomy.  He is chronically colonized but reports unusual symptoms. Patient has completed Levaquin. -ENT consult -no further recommendations    Severe COPD (chronic obstructive pulmonary disease) ([name]) Severe COPD on triple bronchodilator.  Current shortness of breath most likely secondary to heart failure, rather than COPD though. --home Pulmicort neb d/c'ed by pulm.  Chronic home prednisone 10 mg daily was reduced to 5 mg daily by pulm.    Chronic kidney disease, stage 3a ([name]) --Creatinine with slight worsening to 1.39 today --monitor   Hypotension Blood pressure again soft, his home midodrine was initially discontinued when blood pressure remained little elevated. -Restart low-dose midodrine at 2.5 mg 3 times daily  OSA (obstructive sleep apnea) --cont home vent for night-time use   Chronic colitis - Continue home mesalamine   Morbid obesity with BMI of 50.0-59.9, adult [facility]) Patient has a history of morbid  obesity with a BMI of 52, complicating his current medical care.   Chest pain, ACS ruled out Noncardiac in nature.   Anxiety Continue hydroxyzine as  needed. Stress   Subjective:  Patient with no new complaints.  He was thinking of going home with home health, stating that he wants to walk little bit before deciding.  Physical Exam: Vitals:   12/22/22 1614 12/23/22 0207 12/23/22 0909 12/23/22 1356  BP: 113/69 110/74 119/72 104/66  Pulse: 80 78 80 80  Resp: 18 18 16    Temp: 97.8 F (36.6 C) 98 F (36.7 C) 98.5 F (36.9 C)   TempSrc:      SpO2: 93% 100% 93% 91%  Weight:      Height:       General.  Morbidly obese gentleman, in no acute distress.  Trach collar in place Pulmonary.  Lungs clear bilaterally, normal respiratory effort. CV.  Regular rate and rhythm, no JVD, rub or murmur. Abdomen.  Soft, nontender, nondistended, BS positive. CNS.  Alert and oriented .  No focal neurologic deficit. Extremities.  Trace LE edema, no cyanosis, pulses intact and symmetrical. Psychiatry.  Judgment and insight appears normal.     Data Reviewed: Prior data reviewed  Family Communication: None  Disposition: Status is: Inpatient Remains inpatient appropriate because: Unsafe discharge option.  DVT prophylaxis.  Eliquis Time spent: 41 minutes  This record has been created using Conservation officer, historic buildings. Errors have been sought and corrected,but may not always be located. Such creation errors do not reflect on the standard of care.   Author: Arnetha Courser, MD 12/23/2022 2:04 PM  For on call review [email or web address].

## 2022-12-23 NOTE — Plan of Care (Signed)
  Problem: Education: Goal: Knowledge of General Education information will improve Description: Including pain rating scale, medication(s)/side effects and non-pharmacologic comfort measures Outcome: Progressing   Problem: Nutrition: Goal: Adequate nutrition will be maintained Outcome: Progressing   Problem: Elimination: Goal: Will not experience complications related to urinary retention Outcome: Progressing   Problem: Pain Managment: Goal: General experience of comfort will improve Outcome: Progressing   

## 2022-12-24 DIAGNOSIS — R079 Chest pain, unspecified: Secondary | ICD-10-CM | POA: Diagnosis not present

## 2022-12-24 DIAGNOSIS — J449 Chronic obstructive pulmonary disease, unspecified: Secondary | ICD-10-CM | POA: Diagnosis not present

## 2022-12-24 DIAGNOSIS — J9601 Acute respiratory failure with hypoxia: Secondary | ICD-10-CM | POA: Diagnosis not present

## 2022-12-24 DIAGNOSIS — J441 Chronic obstructive pulmonary disease with (acute) exacerbation: Secondary | ICD-10-CM | POA: Diagnosis not present

## 2022-12-24 DIAGNOSIS — J9612 Chronic respiratory failure with hypercapnia: Secondary | ICD-10-CM | POA: Diagnosis not present

## 2022-12-24 DIAGNOSIS — E8770 Fluid overload, unspecified: Secondary | ICD-10-CM | POA: Diagnosis not present

## 2022-12-24 DIAGNOSIS — I5033 Acute on chronic diastolic (congestive) heart failure: Secondary | ICD-10-CM | POA: Diagnosis not present

## 2022-12-24 DIAGNOSIS — Z6841 Body Mass Index (BMI) 40.0 and over, adult: Secondary | ICD-10-CM | POA: Diagnosis not present

## 2022-12-24 DIAGNOSIS — J9611 Chronic respiratory failure with hypoxia: Secondary | ICD-10-CM | POA: Diagnosis not present

## 2022-12-24 DIAGNOSIS — J9602 Acute respiratory failure with hypercapnia: Secondary | ICD-10-CM | POA: Diagnosis not present
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
## 2022-12-24 NOTE — Plan of Care (Signed)

## 2022-12-24 NOTE — Progress Notes (Signed)
Progress Note   Patient: Gary [name] ION:[id number] DOB: 17-Dec-1955 DOA: 11/28/2022     24 DOS: the patient was seen and examined on 12/24/2022   Brief hospital course: ARMANY TORREZ is a 67 y.o. 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   Mr. Polen stated that he ran out of his home torsemide several days ago due to a mixup with his pharmacy.  Then over the last couple days, he has noticed increasing lower extremity swelling, shortness of breath and chest pain.  He states that he usually develops chest pain when he has "fluid on his lungs."    Patient was initially diuresed with IV diuresis, responded well.  Significant improvement in peripheral edema.  IV Lasix was switched with home torsemide once creatinine started increasing.  Patient remained stable from respiratory standpoint point and remained on 6 to 8 L of supplemental oxygen via trach collar which is his baseline.  Patient developed Pseudomonas tracheitis, which was treated with Levaquin as he developed some pain around his tracheostomy.  Patient condition has stabilized, pending long-term care placement.    Principal Problem:   Acute on chronic diastolic CHF (congestive heart failure) ([name]) Active Problems:   Chronic respiratory failure with hypoxia and hypercapnia ([name])   Chest pain   COPD (chronic obstructive pulmonary disease) ([name])   Chronic kidney disease, stage 3a ([name])   Hypotension   OSA (obstructive sleep apnea)   Morbid obesity with BMI of 50.0-59.9, adult ([name])   Chronic colitis   Fluid overload   Assessment and Plan: * Acute on chronic diastolic CHF (congestive heart failure) ([name]) Patient is presenting with increased work of breathing, shortness of breath and evidence of  hypervolemia on examination.  CXR showed underinflation with bilateral areas of scarring or atelectatic change, and vascular congestion. Recent echocardiogram in July 2024 with preserved EF at 55-60% and grade 1 diastolic dysfunction. Patient condition improved after initial treatment with IV furosemide, currently on oral torsemide -Creatinine again started trending up so backing off on torsemide to 40 mg daily dose -Patient did receive an extra dose yesterday for his complaint of shortness of breath. -Will continue with 40 mg at this time and monitoring renal function   Chronic respiratory failure with hypoxia and hypercapnia ([name]) Is on 6 to 8 L of oxygen over trach collar, stable.  Pseudomonas tracheitis Patient currently symptomatic reporting pain and tenderness with light manipulation of tracheostomy.  He is chronically colonized but reports unusual symptoms. Patient has completed Levaquin. -ENT consult -no further recommendations    Severe COPD (chronic obstructive pulmonary disease) ([name]) Severe COPD on triple bronchodilator.  Current shortness of breath most likely secondary to heart failure, rather than COPD though. --home Pulmicort neb d/c'ed by pulm.  Chronic home prednisone 10 mg daily was reduced to 5 mg daily by pulm.    Chronic kidney disease, stage 3a ([name]) --Creatinine with slight worsening to 1.39 today --monitor   Hypotension Blood pressure again soft, his home midodrine was initially discontinued when blood pressure remained little elevated. -Restart low-dose midodrine at 2.5 mg 3 times daily  OSA (obstructive sleep apnea) --cont home vent for night-time use   Chronic colitis - Continue home mesalamine   Morbid obesity with BMI of 50.0-59.9, adult [facility]) Patient has a history of morbid  obesity with a BMI of 52, complicating his current medical care.   Chest pain, ACS ruled out Noncardiac in nature.   Anxiety Continue hydroxyzine as  needed. Stress   Subjective:  Patient was seen and examined today.  Keeps saying that he will go home with home health if they are there before he reached home and stay with him all the time.  Physical Exam: Vitals:   12/23/22 2007 12/24/22 0200 12/24/22 0600 12/24/22 1044  BP:    (!) 92/57  Pulse:    83  Resp:      Temp:    97.8 F (36.6 C)  TempSrc:    Oral  SpO2: 95% 96% 95% 94%  Weight:   (!) 220.6 kg   Height:       General.  Morbidly obese gentleman, in no acute distress.  Trach collar in place Pulmonary.  Lungs clear bilaterally, normal respiratory effort. CV.  Regular rate and rhythm, no JVD, rub or murmur. Abdomen.  Soft, nontender, nondistended, BS positive. CNS.  Alert and oriented .  No focal neurologic deficit. Extremities.  Trace LE edema, no cyanosis, pulses intact and symmetrical. Psychiatry.  Judgment and insight appears normal.   Data Reviewed: Prior data reviewed  Family Communication: None  Disposition: Status is: Inpatient Remains inpatient appropriate because: Unsafe discharge option.  DVT prophylaxis.  Eliquis Time spent: 40 minutes  This record has been created using Conservation officer, historic buildings. Errors have been sought and corrected,but may not always be located. Such creation errors do not reflect on the standard of care.   Author: Arnetha Courser, MD 12/24/2022 3:03 PM  For on call review [email or web address].

## 2022-12-24 NOTE — Progress Notes (Signed)
PT Cancellation Note  Patient Details Name: RAEQUON ZETTLER MRN: [id number] DOB: Dec 20, 1955   Cancelled Treatment:    Reason Eval/Treat Not Completed: Patient declined, no reason specified Patient states " I didn't think y'all were coming. I just want to lay on my side." NT in room to re-position patient. Will continue to follow.     Quinta Eimer 12/24/2022, 1:26 PM

## 2022-12-25 DIAGNOSIS — R079 Chest pain, unspecified: Secondary | ICD-10-CM | POA: Diagnosis not present

## 2022-12-25 DIAGNOSIS — Z6841 Body Mass Index (BMI) 40.0 and over, adult: Secondary | ICD-10-CM | POA: Diagnosis not present

## 2022-12-25 DIAGNOSIS — I5033 Acute on chronic diastolic (congestive) heart failure: Secondary | ICD-10-CM | POA: Diagnosis not present

## 2022-12-25 DIAGNOSIS — J449 Chronic obstructive pulmonary disease, unspecified: Secondary | ICD-10-CM | POA: Diagnosis not present

## 2022-12-25 DIAGNOSIS — J9612 Chronic respiratory failure with hypercapnia: Secondary | ICD-10-CM | POA: Diagnosis not present

## 2022-12-25 DIAGNOSIS — E8770 Fluid overload, unspecified: Secondary | ICD-10-CM | POA: Diagnosis not present

## 2022-12-25 DIAGNOSIS — J9611 Chronic respiratory failure with hypoxia: Secondary | ICD-10-CM | POA: Diagnosis not present

## 2022-12-25 NOTE — TOC Progression Note (Signed)
Transition of Care [facility]) - Progression Note    Patient Details  Name: Gary [name] MRN: [id number] Date of Birth: January 25, 1956  [facility] CM/SW Contact  Harriet Masson, RN Phone Number: 12/25/2022, 1:09 PM  Clinical Narrative:     Sherron Monday to Molly Maduro with [facility] Private duty Nursing care. Molly Maduro states patient doesn't meet private duty nursing criteria due to not having a teachable caregiver.  Notified Deliliah, NCM.  Expected Discharge Plan: Skilled Nursing Facility Barriers to Discharge: Other (must enter comment) (Attempting to find SNF or LTC options as patient stated he cannot care for himself.)  Expected Discharge Plan and Services                                               Social Determinants of Health (SDOH) Interventions SDOH Screenings   Food Insecurity: No Food Insecurity (12/01/2022)  Housing: Patient Declined (12/01/2022)  Transportation Needs: No Transportation Needs (12/01/2022)  Utilities: Not At Risk (12/01/2022)  Depression (PHQ2-9): Low Risk  (06/07/2021)  Financial Resource Strain: High Risk (11/21/2022)   Received from [facility]  Physical Activity: Insufficiently Active (05/03/2017)  Social Connections: Patient Declined (11/20/2022)   Received from [facility]  Stress: Stress Concern Present (11/20/2022)   Received from [facility]  Tobacco Use: Medium Risk (12/01/2022)    Readmission Risk Interventions    11/05/2022    2:59 PM 11/05/2022   12:16 PM 12/31/2021    8:58 AM  Readmission Risk Prevention Plan  Transportation Screening Complete Complete Complete  Medication Review Oceanographer) Complete Complete Complete  PCP or Specialist appointment within 3-5 days of discharge Complete Complete Complete  HRI or Home Care Consult Complete Complete Complete  SW Recovery Care/Counseling Consult Complete Complete Complete  Palliative Care Screening Complete Complete Complete  Skilled Nursing Facility Not Applicable Not  Applicable Not Applicable

## 2022-12-25 NOTE — Progress Notes (Signed)
Occupational Therapy Treatment Patient Details Name: Gary [name] MRN: [id number] DOB: November 07, 1955 Today's Date: 12/25/2022   History of present illness 67 y/o male presented to [facility] ED on 11/28/22 for chest pain x 4 days. Admitted for acute on chronic CHF. Frequent admissions this year with most recent dsicharge on 11/21/22. PMH includes obesity, hypoxia, respiratory failure with tracheostomy, COPD, GIB, HFpEF.   OT comments  Gary [name] was seen for OT treatment on this date. Upon arrival to room pt reclined in bed, agreeable to tx. Pt requires MIN A exit bed, assist for LLE mgmt. CGA + RW standing from elevated bed height ~30". Pt reports L knee pain limiting walking this date however tolerated steps from bed>chair ~5 ft. Plan to increase distance next session. Pt making progress toward goals, will continue to follow POC. Discharge recommendation remains appropriate.       If plan is discharge home, recommend the following:  A little help with walking and/or transfers;A lot of help with bathing/dressing/bathroom   Equipment Recommendations  None recommended by OT    Recommendations for Other Services      Precautions / Restrictions Precautions Precautions: Fall Restrictions Weight Bearing Restrictions: No       Mobility Bed Mobility Overal bed mobility: Needs Assistance Bed Mobility: Supine to Sit     Supine to sit: Min assist     General bed mobility comments: assist for LLE mgmt    Transfers Overall transfer level: Needs assistance Equipment used: Rolling walker (2 wheels) Transfers: Sit to/from Stand Sit to Stand: From elevated surface, Contact guard assist           General transfer comment: stood from ~30" surface     Balance Overall balance assessment: Needs assistance Sitting-balance support: Feet supported, No upper extremity supported Sitting balance-[name] Scale: Good     Standing balance support: Bilateral upper extremity supported, Reliant on  assistive device for balance Standing balance-[name] Scale: Fair                             ADL either performed or assessed with clinical judgement   ADL Overall ADL's : Needs assistance/impaired                                       General ADL Comments: MAX A for LB dressing, SUPERVISION + RW simulated BSC t/f      Cognition Arousal: Alert Behavior During Therapy: WFL for tasks assessed/performed, Anxious Overall Cognitive Status: Within Functional Limits for tasks assessed                                 General Comments: pleasant and agreeable, expresses he is happy he got up to the chair and wishes his knee dindt hurt so he could walk farther                   Pertinent Vitals/ Pain       Pain Assessment Pain Assessment: Faces Faces Pain Scale: Hurts little more Pain Location: L knee Pain Descriptors / Indicators: Aching, Guarding, Grimacing Pain Intervention(s): Limited activity within patient's tolerance, Repositioned   Frequency  Min 1X/week        Progress Toward Goals  OT Goals(current goals can now be found in the care plan section)  Progress  towards OT goals: Progressing toward goals  Acute Rehab OT Goals Patient Stated Goal: to get stronger OT Goal Formulation: With patient Time For Goal Achievement: 12/27/22 Potential to Achieve Goals: Good ADL Goals Pt Will Perform Grooming: with modified independence;standing Pt Will Transfer to Toilet: with modified independence;ambulating;regular height toilet Pt Will Perform Toileting - Clothing Manipulation and hygiene: with mod assist;sit to/from stand  Plan Discharge plan remains appropriate;Frequency remains appropriate    Co-evaluation                 AM-PAC OT "6 Clicks" Daily Activity     Outcome Measure   Help from another person eating meals?: None Help from another person taking care of personal grooming?: A Little Help from another  person toileting, which includes using toliet, bedpan, or urinal?: A Lot Help from another person bathing (including washing, rinsing, drying)?: A Lot Help from another person to put on and taking off regular upper body clothing?: A Little Help from another person to put on and taking off regular lower body clothing?: A Lot 6 Click Score: 16    End of Session Equipment Utilized During Treatment: Oxygen;Rolling walker (2 wheels)  OT Visit Diagnosis: Unsteadiness on feet (R26.81);Other abnormalities of gait and mobility (R26.89);Muscle weakness (generalized) (M62.81);Adult, failure to thrive (R62.7)   Activity Tolerance Patient tolerated treatment well   Patient Left in chair;with call bell/phone within reach   Nurse Communication          Time: 1206-1229 OT Time Calculation (min): 23 min  Charges: OT General Charges $OT Visit: 1 Visit OT Treatments $Self Care/Home Management : 8-22 mins $Therapeutic Activity: 8-22 mins  Kathie Dike, M.S. OTR/L  12/25/22, 1:42 PM  ascom [phone]

## 2022-12-25 NOTE — Progress Notes (Signed)
Progress Note   Patient: Gary [name] DGU:[id number] DOB: 07-25-1955 DOA: 11/28/2022     25 DOS: the patient was seen and examined on 12/25/2022   Brief hospital course: Gary [name] is a 67 y.o. 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   [name] stated that he ran out of his home torsemide several days ago due to a mixup with his pharmacy.  Then over the last couple days, he has noticed increasing lower extremity swelling, shortness of breath and chest pain.  He states that he usually develops chest pain when he has "fluid on his lungs."    Patient was initially diuresed with IV diuresis, responded well.  Significant improvement in peripheral edema.  IV Lasix was switched with home torsemide once creatinine started increasing.  Patient remained stable from respiratory standpoint point and remained on 6 to 8 L of supplemental oxygen via trach collar which is his baseline.  Patient developed Pseudomonas tracheitis, which was treated with Levaquin as he developed some pain around his tracheostomy.  Patient condition has stabilized, pending long-term care placement.    Principal Problem:   Acute on chronic diastolic CHF (congestive heart failure) ([name]) Active Problems:   Chronic respiratory failure with hypoxia and hypercapnia ([name])   Chest pain   COPD (chronic obstructive pulmonary disease) ([name])   Chronic kidney disease, stage 3a ([name])   Hypotension   OSA (obstructive sleep apnea)   Morbid obesity with BMI of 50.0-59.9, adult ([name])   Chronic colitis   Fluid overload   Assessment and Plan: * Acute on chronic diastolic CHF (congestive heart failure) ([name]) Patient is presenting with increased work of breathing, shortness of breath and evidence of  hypervolemia on examination.  CXR showed underinflation with bilateral areas of scarring or atelectatic change, and vascular congestion. Recent echocardiogram in July 2024 with preserved EF at 55-60% and grade 1 diastolic dysfunction. Patient condition improved after initial treatment with IV furosemide, currently on oral torsemide -Creatinine again started trending up so backing off on torsemide to 40 mg daily dose -Patient did receive an extra dose yesterday for his complaint of shortness of breath. -Will continue with 40 mg at this time and monitoring renal function   Chronic respiratory failure with hypoxia and hypercapnia ([name]) Is on 6 to 8 L of oxygen over trach collar, stable.  Pseudomonas tracheitis Patient currently symptomatic reporting pain and tenderness with light manipulation of tracheostomy.  He is chronically colonized but reports unusual symptoms. Patient has completed Levaquin. -ENT consult -no further recommendations    Severe COPD (chronic obstructive pulmonary disease) ([name]) Severe COPD on triple bronchodilator.  Current shortness of breath most likely secondary to heart failure, rather than COPD though. --home Pulmicort neb d/c'ed by pulm.  Chronic home prednisone 10 mg daily was reduced to 5 mg daily by pulm.    Chronic kidney disease, stage 3a ([name]) --Creatinine with slight worsening to 1.39 today --monitor   Hypotension Blood pressure again soft, his home midodrine was initially discontinued when blood pressure remained little elevated. -Restart low-dose midodrine at 2.5 mg 3 times daily  OSA (obstructive sleep apnea) --cont home vent for night-time use   Chronic colitis - Continue home mesalamine   Morbid obesity with BMI of 50.0-59.9, adult [facility]) Patient has a history of morbid  obesity with a BMI of 52, complicating his current medical care.   Chest pain, ACS ruled out Noncardiac in nature.   Anxiety Continue hydroxyzine as  needed. Stress   Subjective:  Patient with no new concern.  Keeps saying that he wants 24/7 care at home, tried explaining that it will be only available couple of times a week unless he can hire someone privately  Physical Exam: Vitals:   12/24/22 2117 12/24/22 2343 12/25/22 0729 12/25/22 0902  BP:  116/69  111/66  Pulse:  82  80  Resp:  20  18  Temp:  98.2 F (36.8 C)  98 F (36.7 C)  TempSrc:      SpO2: 92% 98% 98% 91%  Weight:      Height:       General.  Morbidly obese gentleman, in no acute distress. Pulmonary.  Lungs clear bilaterally, normal respiratory effort. CV.  Regular rate and rhythm, no JVD, rub or murmur. Abdomen.  Soft, nontender, nondistended, BS positive. CNS.  Alert and oriented .  No focal neurologic deficit. Extremities.  Trace LE edema, no cyanosis, pulses intact and symmetrical. Psychiatry.  Judgment and insight appears normal.   Data Reviewed: Prior data reviewed  Family Communication: None  Disposition: Status is: Inpatient Remains inpatient appropriate because: Unsafe discharge option.  DVT prophylaxis.  Eliquis Time spent: 39 minutes  This record has been created using Conservation officer, historic buildings. Errors have been sought and corrected,but may not always be located. Such creation errors do not reflect on the standard of care.   Author: Arnetha Courser, MD 12/25/2022 1:29 PM  For on call review [email or web address].

## 2022-12-25 NOTE — TOC Progression Note (Signed)
Transition of Care [facility]) - Progression Note    Patient Details  Name: Gary [name] MRN: [id number] Date of Birth: [birth year]  Transition of Care [facility]) CM/SW Contact  Marlowe Sax, RN Phone Number: 12/25/2022, 12:07 PM  Clinical Narrative:     Met with the patient to discuss his plan for DC, he stated that he plans to go to Short term rehab and then go home with Home health, I explained that Home health will not be at his house daily, l explained that home health is not at his house all day every day,  explained that they typically only come a couple of times per week for about an hour, he stated that he wants to go to long term rehab, I explained that it is not long term rehab it is short term rehab or long term care meaning a nursing facility, he stated that he wants to go to Short term rehab and then home, he then stated that his sister is coming down from [place] to buy a house in [place] and he will move in with her, I explained that she would have to be agreeable to that, he stated that she would be, I explained that is not an immediate plan as she has not moved down here yet And that it takes time to move and to purchase a home, He stated understanding  I explained that if he plans to go to short term rehab he will need to work with therapy every time they come to work with him, he said that sometimes he does not want to that it is not the right time, I explained that if he wants Insurance to cover going to short term rehab he can not refuse working with therapy or his insurance will not cover him going to rehab, he has to be able to show progress and willingness to work with therapy  He again mentioned going to long term rehab, I explained again that he will not go to long term rehab that it is not long term rehab it is short term rehab and the typical stay is 1-2 weeks and that he would need a safe DC plan after that, he stated afterwards he would go home with his girlfriend as his  caregiver and home health I reiterated that home health is not a full time plan, that they only come 2-3 days per week at most and are only there 45 min to an hour each visit,  He stated that he would work with therapy.   Expected Discharge Plan: Skilled Nursing Facility Barriers to Discharge: Other (must enter comment) (Attempting to find SNF or LTC options as patient stated he cannot care for himself.)  Expected Discharge Plan and Services                                               Social Determinants of Health (SDOH) Interventions SDOH Screenings   Food Insecurity: No Food Insecurity (12/01/2022)  Housing: Patient Declined (12/01/2022)  Transportation Needs: No Transportation Needs (12/01/2022)  Utilities: Not At Risk (12/01/2022)  Depression (PHQ2-9): Low Risk  (06/07/2021)  Financial Resource Strain: High Risk (11/21/2022)   Received from [facility]  Physical Activity: Insufficiently Active (05/03/2017)  Social Connections: Patient Declined (11/20/2022)   Received from [facility]  Stress: Stress Concern Present (11/20/2022)   Received  from [facility]  Tobacco Use: Medium Risk (12/01/2022)    Readmission Risk Interventions    11/05/2022    2:59 PM 11/05/2022   12:16 PM 12/31/2021    8:58 AM  Readmission Risk Prevention Plan  Transportation Screening Complete Complete Complete  Medication Review Oceanographer) Complete Complete Complete  PCP or Specialist appointment within 3-5 days of discharge Complete Complete Complete  HRI or Home Care Consult Complete Complete Complete  SW Recovery Care/Counseling Consult Complete Complete Complete  Palliative Care Screening Complete Complete Complete  Skilled Nursing Facility Not Applicable Not Applicable Not Applicable

## 2022-12-25 NOTE — Progress Notes (Signed)
PT Cancellation Note  Patient Details Name: Gary [name] MRN: [id number] DOB: 10/01/1955   Cancelled Treatment:    Reason Eval/Treat Not Completed: Patient declined to participate with PT services this date stating "I've already done all that". Will attempt to see pt at a future date/time as medically appropriate.    Ovidio Hanger PT, DPT 12/25/22, 3:49 PM

## 2022-12-26 DIAGNOSIS — J9611 Chronic respiratory failure with hypoxia: Secondary | ICD-10-CM | POA: Diagnosis not present

## 2022-12-26 DIAGNOSIS — J449 Chronic obstructive pulmonary disease, unspecified: Secondary | ICD-10-CM | POA: Diagnosis not present

## 2022-12-26 DIAGNOSIS — Z6841 Body Mass Index (BMI) 40.0 and over, adult: Secondary | ICD-10-CM | POA: Diagnosis not present

## 2022-12-26 DIAGNOSIS — R079 Chest pain, unspecified: Secondary | ICD-10-CM | POA: Diagnosis not present

## 2022-12-26 DIAGNOSIS — I5033 Acute on chronic diastolic (congestive) heart failure: Secondary | ICD-10-CM | POA: Diagnosis not present

## 2022-12-26 DIAGNOSIS — E8770 Fluid overload, unspecified: Secondary | ICD-10-CM | POA: Diagnosis not present

## 2022-12-26 DIAGNOSIS — J9612 Chronic respiratory failure with hypercapnia: Secondary | ICD-10-CM | POA: Diagnosis not present

## 2022-12-26 MED ORDER — TORSEMIDE 20 MG PO TABS
20.0000 mg | ORAL_TABLET | Freq: Every day | ORAL | Status: DC
  Administered 2022-12-27 – 2023-01-09 (×14): 20 mg via ORAL
  Filled 2022-12-26 (×14): qty 1

## 2022-12-26 MED ORDER — SPIRONOLACTONE 25 MG PO TABS
25.0000 mg | ORAL_TABLET | Freq: Every day | ORAL | Status: DC
  Administered 2022-12-27 – 2023-01-24 (×29): 25 mg via ORAL
  Filled 2022-12-26 (×30): qty 1
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
## 2022-12-26 NOTE — Progress Notes (Signed)
Progress Note   Patient: Gary [name] ZOX:[id number] DOB: 07/18/1955 DOA: 11/28/2022     26 DOS: the patient was seen and examined on 12/26/2022   Brief hospital course: Gary [name] is a 67 y.o. 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   Gary [name] stated that he ran out of his home torsemide several days ago due to a mixup with his pharmacy.  Then over the last couple days, he has noticed increasing lower extremity swelling, shortness of breath and chest pain.  He states that he usually develops chest pain when he has "fluid on his lungs."    Patient was initially diuresed with IV diuresis, responded well.  Significant improvement in peripheral edema.  IV Lasix was switched with home torsemide once creatinine started increasing.  Patient remained stable from respiratory standpoint point and remained on 6 to 8 L of supplemental oxygen via trach collar which is his baseline.  Patient developed Pseudomonas tracheitis, which was treated with Levaquin as he developed some pain around his tracheostomy.  Patient condition has stabilized, pending long-term care placement.    9/11: Started working with PT, hopefully can go home with home health soon.  Still no bed offers.  Principal Problem:   Acute on chronic diastolic CHF (congestive heart failure) ([name]) Active Problems:   Chronic respiratory failure with hypoxia and hypercapnia ([name])   Chest pain   COPD (chronic obstructive pulmonary disease) ([name])   Chronic kidney disease, stage 3a ([name])   Hypotension   OSA (obstructive sleep apnea)   Morbid obesity with BMI of 50.0-59.9, adult ([name])   Chronic colitis   Fluid overload   Assessment and Plan: * Acute on chronic diastolic CHF (congestive heart failure)  ([name]) Patient is presenting with increased work of breathing, shortness of breath and evidence of hypervolemia on examination.  CXR showed underinflation with bilateral areas of scarring or atelectatic change, and vascular congestion. Recent echocardiogram in July 2024 with preserved EF at 55-60% and grade 1 diastolic dysfunction. Patient condition improved after initial treatment with IV furosemide, currently on oral torsemide -Continue p.o. torsemide at 40 mg daily   Chronic respiratory failure with hypoxia and hypercapnia ([name]) Is on 6 to 8 L of oxygen over trach collar, stable.  Pseudomonas tracheitis Patient currently symptomatic reporting pain and tenderness with light manipulation of tracheostomy.  He is chronically colonized but reports unusual symptoms. Patient has completed Levaquin. -ENT consult -no further recommendations    Severe COPD (chronic obstructive pulmonary disease) ([name]) Severe COPD on triple bronchodilator.  Current shortness of breath most likely secondary to heart failure, rather than COPD though. --home Pulmicort neb d/c'ed by pulm.  Chronic home prednisone 10 mg daily was reduced to 5 mg daily by pulm.    Chronic kidney disease, stage 3a ([name]) --Seems stable --monitor   Hypotension Blood pressure again soft, his home midodrine was initially discontinued when blood pressure remained little elevated. -Restart low-dose midodrine at 2.5 mg 3 times daily  OSA (obstructive sleep apnea) --cont home vent for night-time use   Chronic colitis - Continue home mesalamine   Morbid obesity with BMI of 50.0-59.9, adult [facility]) Patient has a history of morbid obesity with a BMI of 52, complicating his current medical care.   Chest pain, ACS ruled out Noncardiac in  nature.   Anxiety Continue hydroxyzine as needed. Stress   Subjective:  Patient with no new concern.  Patient agreed to work with PT.  Physical Exam: Vitals:   12/26/22 0031 12/26/22 0814 12/26/22  0815 12/26/22 1512  BP: 110/76 120/85  105/66  Pulse: 79 80 81 65  Resp: 20 18 16 17   Temp: 98 F (36.7 C) 98.2 F (36.8 C)  98.6 F (37 C)  TempSrc:      SpO2: 92% 95% 94% 97%  Weight:      Height:       General.  Morbidly obese gentleman, in no acute distress.  Trach collar in place Pulmonary.  Lungs clear bilaterally, normal respiratory effort. CV.  Regular rate and rhythm, no JVD, rub or murmur. Abdomen.  Soft, nontender, nondistended, BS positive. CNS.  Alert and oriented .  No focal neurologic deficit. Extremities.  No edema, no cyanosis, pulses intact and symmetrical. Psychiatry.  Judgment and insight appears normal.   Data Reviewed: Prior data reviewed  Family Communication: None  Disposition: Status is: Inpatient Remains inpatient appropriate because: Unsafe discharge option.  DVT prophylaxis.  Eliquis Time spent: 38 minutes  This record has been created using Conservation officer, historic buildings. Errors have been sought and corrected,but may not always be located. Such creation errors do not reflect on the standard of care.   Author: Arnetha Courser, MD 12/26/2022 3:16 PM  For on call review [email or web address].

## 2022-12-26 NOTE — Progress Notes (Signed)
Occupational Therapy Treatment Patient Details Name: Gary [name] MRN: [id number] DOB: 06-May-1955 Today's Date: 12/26/2022   History of present illness 67 y/o male presented to [facility] ED on 11/28/22 for chest pain x 4 days. Admitted for acute on chronic CHF. Frequent admissions this year with most recent dsicharge on 11/21/22. PMH includes obesity, hypoxia, respiratory failure with tracheostomy, COPD, GIB, HFpEF.   OT comments  Gary [name] was seen for OT treatment on this date. Upon arrival to room pt reclined in bed, agreeable to tx. Pt requires no physical assist to exit bed, use of rails and bed controls. Requires MAX A for LB dressing, SUPERVISION + RW for ADL t/f ~60 ft. Defers attempting to stand from low chair height - fearful of falling. Pt making good progress toward goals, will continue to follow POC. Discharge recommendation remains appropriate.        If plan is discharge home, recommend the following:  A little help with walking and/or transfers;A lot of help with bathing/dressing/bathroom   Equipment Recommendations  None recommended by OT    Recommendations for Other Services      Precautions / Restrictions Precautions Precautions: Fall Precaution Comments: trach collar/ Trach Restrictions Weight Bearing Restrictions: No       Mobility Bed Mobility Overal bed mobility: Needs Assistance Bed Mobility: Supine to Sit     Supine to sit: Supervision, Used rails          Transfers Overall transfer level: Needs assistance Equipment used: Rolling walker (2 wheels) Transfers: Sit to/from Stand Sit to Stand: From elevated surface, Contact guard assist           General transfer comment: significantly elevated surface     Balance Overall balance assessment: Needs assistance Sitting-balance support: Feet supported, No upper extremity supported Sitting balance-[name] Scale: Good     Standing balance support: Bilateral upper extremity supported, Reliant on  assistive device for balance Standing balance-[name] Scale: Fair Standing balance comment: Reliant on RW/AD for all standing activity.                           ADL either performed or assessed with clinical judgement   ADL Overall ADL's : Needs assistance/impaired                                       General ADL Comments: MAX A for LB dressing, SUPERVISION + RW simulated toilet t/f      Cognition Arousal: Alert Behavior During Therapy: WFL for tasks assessed/performed, Anxious Overall Cognitive Status: Within Functional Limits for tasks assessed                                                     Pertinent Vitals/ Pain       Pain Assessment Pain Assessment: No/denies pain   Frequency  Min 1X/week        Progress Toward Goals  OT Goals(current goals can now be found in the care plan section)  Progress towards OT goals: Progressing toward goals  Acute Rehab OT Goals Patient Stated Goal: to get stronger OT Goal Formulation: With patient Time For Goal Achievement: 12/27/22 Potential to Achieve Goals: Good ADL Goals Pt Will Perform Grooming: with modified  independence;standing Pt Will Transfer to Toilet: with modified independence;ambulating;regular height toilet Pt Will Perform Toileting - Clothing Manipulation and hygiene: with mod assist;sit to/from stand  Plan Discharge plan remains appropriate;Frequency remains appropriate    Co-evaluation                 AM-PAC OT "6 Clicks" Daily Activity     Outcome Measure   Help from another person eating meals?: None Help from another person taking care of personal grooming?: A Little Help from another person toileting, which includes using toliet, bedpan, or urinal?: A Lot Help from another person bathing (including washing, rinsing, drying)?: A Lot Help from another person to put on and taking off regular upper body clothing?: None Help from another person to  put on and taking off regular lower body clothing?: A Lot 6 Click Score: 17    End of Session Equipment Utilized During Treatment: Oxygen;Rolling walker (2 wheels)  OT Visit Diagnosis: Unsteadiness on feet (R26.81);Other abnormalities of gait and mobility (R26.89);Muscle weakness (generalized) (M62.81);Adult, failure to thrive (R62.7)   Activity Tolerance Patient tolerated treatment well   Patient Left in bed;with call bell/phone within reach   Nurse Communication Mobility status        Time: 8295-6213 OT Time Calculation (min): 23 min  Charges: OT General Charges $OT Visit: 1 Visit OT Treatments $Self Care/Home Management : 8-22 mins  Kathie Dike, M.S. OTR/L  12/26/22, 3:49 PM  ascom [phone]

## 2022-12-26 NOTE — Progress Notes (Signed)
Physical Therapy Treatment Patient Details Name: Gary [name] MRN: [id number] DOB: 12/29/1955 Today's Date: 12/26/2022   History of Present Illness 67 y/o male presented to [facility] ED on 11/28/22 for chest pain x 4 days. Admitted for acute on chronic CHF. Frequent admissions this year with most recent dsicharge on 11/21/22. PMH includes obesity, hypoxia, respiratory failure with tracheostomy, COPD, GIB, HFpEF.    PT Comments  Pt was willing to ambulate and generally did well today.  He was able to get up to sitting EOB using rails w/o assist, and though he continues to insist get up from elevated height he did so today w/o physical assist.  PT showed good balance and safety with modest bout of ambulation, requesting to sit relatively quickly with c/o UE fatigue.  Pt refused to remain sitting in recliner, Hoyer lift back to bed.      If plan is discharge home, recommend the following: A little help with walking and/or transfers;A lot of help with bathing/dressing/bathroom;Assist for transportation   Can travel by private vehicle     No  Equipment Recommendations  Other (comment) (t endorses having all equipment needs but will need to confirm prior to DC)    Recommendations for Other Services       Precautions / Restrictions Precautions Precautions: Fall Precaution Comments: trach collar/ Trach Restrictions Weight Bearing Restrictions: No     Mobility  Bed Mobility Overal bed mobility: Needs Assistance Bed Mobility: Supine to Sit     Supine to sit: Supervision, Used rails     General bed mobility comments: Hoyer lift back to bed, able to pull self up in bed with Trendelenberg incline    Transfers Overall transfer level: Needs assistance Equipment used: Rolling walker (2 wheels) Transfers: Sit to/from Stand Sit to Stand: From elevated surface, Contact guard assist           General transfer comment: significantly elevated surface    Ambulation/Gait Ambulation/Gait  assistance: Supervision Gait Distance (Feet): 57 Feet Assistive device: Rolling walker (2 wheels) Gait Pattern/deviations: Step-through pattern, Wide base of support       General Gait Details: Pt was able to ambulate with relative confidence, maintained walker momentum and consistent cadence for much of the effort.  Vitals stable but reports significant UE fatigue and requested to sit just shy of 60 ft.   Stairs             Wheelchair Mobility     Tilt Bed    Modified Rankin (Stroke Patients Only)       Balance Overall balance assessment: Needs assistance Sitting-balance support: Feet supported, No upper extremity supported Sitting balance-[name] Scale: Good     Standing balance support: Bilateral upper extremity supported, Reliant on assistive device for balance Standing balance-[name] Scale: Fair Standing balance comment: Reliant on RW/AD for all standing activity.                            Cognition Arousal: Alert Behavior During Therapy: WFL for tasks assessed/performed, Anxious Overall Cognitive Status: Within Functional Limits for tasks assessed                                          Exercises      General Comments General comments (skin integrity, edema, etc.): Pt showed good willingness to particiapte with mobility, ambulation.  Still  struggling with getting up from "low" surfaces      Pertinent Vitals/Pain Pain Assessment Pain Assessment: No/denies pain    Home Living                          Prior Function            PT Goals (current goals can now be found in the care plan section) Progress towards PT goals: Progressing toward goals    Frequency    Min 1X/week      PT Plan Current plan remains appropriate    Co-evaluation              AM-PAC PT "6 Clicks" Mobility   Outcome Measure  Help needed turning from your back to your side while in a flat bed without using bedrails?:  None Help needed moving from lying on your back to sitting on the side of a flat bed without using bedrails?: None Help needed moving to and from a bed to a chair (including a wheelchair)?: A Little Help needed standing up from a chair using your arms (e.g., wheelchair or bedside chair)?: Total Help needed to walk in hospital room?: A Little Help needed climbing 3-5 steps with a railing? : A Lot 6 Click Score: 17    End of Session Equipment Utilized During Treatment: Oxygen (trach) Activity Tolerance: Patient tolerated treatment well;Patient limited by fatigue Patient left: in bed;with call bell/phone within reach;with bed alarm set Nurse Communication: Mobility status PT Visit Diagnosis: Muscle weakness (generalized) (M62.81);Difficulty in walking, not elsewhere classified (R26.2)     Time: 9562-1308 PT Time Calculation (min) (ACUTE ONLY): 22 min  Charges:    $Therapeutic Activity: 8-22 mins PT General Charges $$ ACUTE PT VISIT: 1 Visit                     [name], DPT 12/26/2022, 5:44 PM

## 2022-12-26 NOTE — Plan of Care (Signed)
  Problem: Education: Goal: Knowledge of General Education information will improve Description: Including pain rating scale, medication(s)/side effects and non-pharmacologic comfort measures Outcome: Progressing   Problem: Health Behavior/Discharge Planning: Goal: Ability to manage health-related needs will improve Outcome: Progressing   Problem: Clinical Measurements: Goal: Will remain free from infection Outcome: Progressing   Problem: Nutrition: Goal: Adequate nutrition will be maintained Outcome: Progressing   Problem: Elimination: Goal: Will not experience complications related to bowel motility Outcome: Progressing Goal: Will not experience complications related to urinary retention Outcome: Progressing   Problem: Pain Managment: Goal: General experience of comfort will improve Outcome: Progressing   Problem: Safety: Goal: Ability to remain free from injury will improve Outcome: Progressing

## 2022-12-26 NOTE — Plan of Care (Signed)

## 2022-12-27 DIAGNOSIS — I5033 Acute on chronic diastolic (congestive) heart failure: Secondary | ICD-10-CM | POA: Diagnosis not present

## 2022-12-27 DIAGNOSIS — J9602 Acute respiratory failure with hypercapnia: Secondary | ICD-10-CM | POA: Diagnosis not present

## 2022-12-27 DIAGNOSIS — Z6841 Body Mass Index (BMI) 40.0 and over, adult: Secondary | ICD-10-CM | POA: Diagnosis not present

## 2022-12-27 DIAGNOSIS — E8770 Fluid overload, unspecified: Secondary | ICD-10-CM | POA: Diagnosis not present

## 2022-12-27 DIAGNOSIS — J449 Chronic obstructive pulmonary disease, unspecified: Secondary | ICD-10-CM | POA: Diagnosis not present

## 2022-12-27 DIAGNOSIS — J9611 Chronic respiratory failure with hypoxia: Secondary | ICD-10-CM | POA: Diagnosis not present

## 2022-12-27 DIAGNOSIS — J9612 Chronic respiratory failure with hypercapnia: Secondary | ICD-10-CM | POA: Diagnosis not present

## 2022-12-27 DIAGNOSIS — R079 Chest pain, unspecified: Secondary | ICD-10-CM | POA: Diagnosis not present

## 2022-12-27 DIAGNOSIS — J441 Chronic obstructive pulmonary disease with (acute) exacerbation: Secondary | ICD-10-CM | POA: Diagnosis not present

## 2022-12-27 DIAGNOSIS — J9601 Acute respiratory failure with hypoxia: Secondary | ICD-10-CM | POA: Diagnosis not present

## 2022-12-27 MED ORDER — HYDROXYZINE HCL 25 MG PO TABS
25.0000 mg | ORAL_TABLET | Freq: Three times a day (TID) | ORAL | Status: DC | PRN
  Administered 2022-12-27 – 2023-01-03 (×11): 25 mg via ORAL
  Filled 2022-12-27 (×12): qty 1

## 2022-12-27 NOTE — Progress Notes (Signed)
Pt A&O x 4. Sitting up right in bed. Denies pain. Calls out 1-2 x's per hour with request. States staff do not meet his requested needs. Verbally abusive and demanding behaviors toward staff members.
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
## 2022-12-27 NOTE — Progress Notes (Signed)
Mobility Specialist - Progress Note   12/27/22 1548  Mobility  Activity Transferred from chair to bed  Level of Assistance Maximum assist, patient does 25-49%  Assistive Device  (Hoyer lift)  Activity Response Tolerated well  $Mobility charge 1 Mobility  Mobility Specialist Start Time (ACUTE ONLY) 1506  Mobility Specialist Stop Time (ACUTE ONLY) 1536  Mobility Specialist Time Calculation (min) (ACUTE ONLY) 30 min   Pt sitting in the recliner upon entry, requesting to transfer to bed. Pt transferred to bed via [facility] lift +2 for safety. Once in fowler position Pt desat to the mid 80s (85-86%), recovering to the low 90s (91%) after ~2 mins of pursed lip breathing. Pt left in fowler position with needs within reach.  Zetta Bills Mobility Specialist 12/27/22 4:01 PM

## 2022-12-27 NOTE — Plan of Care (Signed)
  Problem: Education: Goal: Knowledge of General Education information will improve Description: Including pain rating scale, medication(s)/side effects and non-pharmacologic comfort measures Outcome: Progressing   Problem: Clinical Measurements: Goal: Ability to maintain clinical measurements within normal limits will improve Outcome: Progressing Goal: Will remain free from infection Outcome: Progressing Goal: Diagnostic test results will improve Outcome: Progressing Goal: Respiratory complications will improve Outcome: Progressing Goal: Cardiovascular complication will be avoided Outcome: Progressing   Problem: Activity: Goal: Risk for activity intolerance will decrease Outcome: Progressing   Problem: Nutrition: Goal: Adequate nutrition will be maintained Outcome: Progressing   Problem: Elimination: Goal: Will not experience complications related to bowel motility Outcome: Progressing Goal: Will not experience complications related to urinary retention Outcome: Progressing   Problem: Pain Managment: Goal: General experience of comfort will improve Outcome: Progressing   Problem: Safety: Goal: Ability to remain free from injury will improve Outcome: Progressing   Problem: Skin Integrity: Goal: Risk for impaired skin integrity will decrease Outcome: Progressing   

## 2022-12-27 NOTE — Progress Notes (Signed)
Occupational Therapy Re-Evaluation Patient Details Name: Gary [name] MRN: [id number] DOB: 20-May-1955 Today's Date: 12/27/2022   History of present illness 67 y/o male presented to [facility] ED on 11/28/22 for chest pain x 4 days. Admitted for acute on chronic CHF. Frequent admissions this year with most recent dsicharge on 11/21/22. PMH includes obesity, hypoxia, respiratory failure with tracheostomy, COPD, GIB, HFpEF.   OT comments  Mr Omana was seen for OT/PT co-treatment on this date. Upon arrival to room pt reclined in bed, agreeable to tx. Pt requires no assist for bed mobility using rails and bed controls. SUPERVISION + RW sit<>stand and functional mobility ~20 ft, limited by respiratory status, SpO2 88% on 6L trach collar after activity. MAX A pericare standing. Pt making progress toward goals, will continue to follow POC. Discharge recommendation remains appropriate.       If plan is discharge home, recommend the following:  A little help with walking and/or transfers;A lot of help with bathing/dressing/bathroom   Equipment Recommendations  None recommended by OT       Precautions / Restrictions Precautions Precautions: Fall Precaution Comments: trach collar/ Trach Restrictions Weight Bearing Restrictions: No RLE Weight Bearing: Weight bearing as tolerated       Mobility Bed Mobility Overal bed mobility: Needs Assistance Bed Mobility: Supine to Sit Rolling: Used rails, Supervision              Transfers Overall transfer level: Needs assistance Equipment used: Rolling walker (2 wheels) Transfers: Sit to/from Stand Sit to Stand: From elevated surface, Supervision           General transfer comment: standing from 28" bed height     Balance Overall balance assessment: Needs assistance Sitting-balance support: Feet supported, No upper extremity supported Sitting balance-[name] Scale: Good     Standing balance support: Bilateral upper extremity supported,  Reliant on assistive device for balance Standing balance-[name] Scale: Fair                             ADL either performed or assessed with clinical judgement   ADL Overall ADL's : Needs assistance/impaired                                       General ADL Comments: MAX A pericare standing. SUPERVISION + RW for functional mobility    Extremity/Trunk Assessment Upper Extremity Assessment Upper Extremity Assessment: Overall WFL for tasks assessed   Lower Extremity Assessment Lower Extremity Assessment: Generalized weakness         Cognition Arousal: Alert Behavior During Therapy: WFL for tasks assessed/performed, Anxious Overall Cognitive Status: Within Functional Limits for tasks assessed                                                     Pertinent Vitals/ Pain       Pain Assessment Pain Assessment: No/denies pain   Frequency  Min 1X/week        Progress Toward Goals  OT Goals(current goals can now be found in the care plan section)  Progress towards OT goals: Progressing toward goals  Acute Rehab OT Goals Patient Stated Goal: get stronger OT Goal Formulation: With patient Time For Goal Achievement: 01/10/23  Potential to Achieve Goals: Good ADL Goals Pt Will Perform Grooming: with modified independence;standing Pt Will Transfer to Toilet: with modified independence;ambulating;regular height toilet Pt Will Perform Toileting - Clothing Manipulation and hygiene: with mod assist;sit to/from stand  Plan Discharge plan remains appropriate;Frequency remains appropriate    Co-evaluation    PT/OT/SLP Co-Evaluation/Treatment: Yes Reason for Co-Treatment: For patient/therapist safety;To address functional/ADL transfers PT goals addressed during session: Mobility/safety with mobility;Balance;Proper use of DME;Strengthening/ROM OT goals addressed during session: ADL's and self-care      AM-PAC OT "6 Clicks" Daily  Activity     Outcome Measure   Help from another person eating meals?: None Help from another person taking care of personal grooming?: A Little Help from another person toileting, which includes using toliet, bedpan, or urinal?: A Lot Help from another person bathing (including washing, rinsing, drying)?: A Lot Help from another person to put on and taking off regular upper body clothing?: None Help from another person to put on and taking off regular lower body clothing?: A Lot 6 Click Score: 17    End of Session    OT Visit Diagnosis: Unsteadiness on feet (R26.81);Other abnormalities of gait and mobility (R26.89);Muscle weakness (generalized) (M62.81);Adult, failure to thrive (R62.7)   Activity Tolerance Patient tolerated treatment well   Patient Left with call bell/phone within reach;in chair   Nurse Communication          Time: 1300-1330 OT Time Calculation (min): 30 min  Charges: OT General Charges $OT Visit: 1 Visit OT Treatments $Self Care/Home Management : 8-22 mins  Kathie Dike, M.S. OTR/L  12/27/22, 2:22 PM  ascom [phone]

## 2022-12-27 NOTE — Progress Notes (Signed)
Physical Therapy Treatment Patient Details Name: Gary [name] MRN: [id number] DOB: December 09, 1955 Today's Date: 12/27/2022   History of Present Illness 67 y/o male presented to [facility] ED on 11/28/22 for chest pain x 4 days. Admitted for acute on chronic CHF. Frequent admissions this year with most recent dsicharge on 11/21/22. PMH includes obesity, hypoxia, respiratory failure with tracheostomy, COPD, GIB, HFpEF.    PT Comments  Pt continues to show good motivation with mobility this date, though he does endorse feeling as though he is not breathing well today.  Vitals were stable on trach O2, able to tolerate only ~1/2 the distance of ambulation as he did yesterday.  Pt continues to need extra time and encouragement for basic tasks, but does show a good understanding of his needs, limitations and equipment.  Good overall effort, continue with POC.      If plan is discharge home, recommend the following: A little help with walking and/or transfers;A lot of help with bathing/dressing/bathroom;Assist for transportation   Can travel by private vehicle     No  Equipment Recommendations  Other (comment) (pt endorses having all equipment needs but will need to confirm prior to DC)    Recommendations for Other Services       Precautions / Restrictions Precautions Precautions: Fall Precaution Comments: trach collar/ Trach Restrictions Weight Bearing Restrictions: No RLE Weight Bearing: Weight bearing as tolerated     Mobility  Bed Mobility Overal bed mobility: Needs Assistance Bed Mobility: Supine to Sit Rolling: Used rails, Supervision              Transfers Overall transfer level: Needs assistance Equipment used: Rolling walker (2 wheels) Transfers: Sit to/from Stand Sit to Stand: From elevated surface, Supervision ((~28"))           General transfer comment: per his usual heavy forward weight shift, heavy UE use    Ambulation/Gait Ambulation/Gait assistance:  Supervision Gait Distance (Feet): 25 Feet Assistive device: Rolling walker (2 wheels)         General Gait Details: Pt was able to ambulate w/o phyiscal assist but was quicker to fatigue today requesting to sit after modest effort.  Vitals stable but reports significant difficulty breath.   Stairs             Wheelchair Mobility     Tilt Bed    Modified Rankin (Stroke Patients Only)       Balance Overall balance assessment: Needs assistance Sitting-balance support: Feet supported, No upper extremity supported Sitting balance-[name] Scale: Good     Standing balance support: Bilateral upper extremity supported, Reliant on assistive device for balance Standing balance-[name] Scale: Fair Standing balance comment: Reliant on RW/AD for all standing activity.                            Cognition Arousal: Alert Behavior During Therapy: WFL for tasks assessed/performed, Anxious Overall Cognitive Status: Within Functional Limits for tasks assessed                                          Exercises      General Comments General comments (skin integrity, edema, etc.): Pt ostensibly motivated to push a farther walk this date, but too fatigued and c/o breathing issues to increase distance      Pertinent Vitals/Pain Pain Assessment Pain Assessment: No/denies pain  Home Living                          Prior Function            PT Goals (current goals can now be found in the care plan section) Progress towards PT goals: Progressing toward goals    Frequency    Min 1X/week      PT Plan Current plan remains appropriate    Co-evaluation PT/OT/SLP Co-Evaluation/Treatment: Yes Reason for Co-Treatment: For patient/therapist safety;To address functional/ADL transfers PT goals addressed during session: Mobility/safety with mobility;Balance;Proper use of DME;Strengthening/ROM OT goals addressed during session: ADL's and  self-care      AM-PAC PT "6 Clicks" Mobility   Outcome Measure  Help needed turning from your back to your side while in a flat bed without using bedrails?: None Help needed moving from lying on your back to sitting on the side of a flat bed without using bedrails?: None Help needed moving to and from a bed to a chair (including a wheelchair)?: A Little Help needed standing up from a chair using your arms (e.g., wheelchair or bedside chair)?: Total Help needed to walk in hospital room?: A Little Help needed climbing 3-5 steps with a railing? : A Lot 6 Click Score: 17    End of Session Equipment Utilized During Treatment: Oxygen (trach) Activity Tolerance: Patient tolerated treatment well;Patient limited by fatigue Patient left: in bed;with call bell/phone within reach;with bed alarm set Nurse Communication: Mobility status PT Visit Diagnosis: Muscle weakness (generalized) (M62.81);Difficulty in walking, not elsewhere classified (R26.2)     Time: 1300-1330 PT Time Calculation (min) (ACUTE ONLY): 30 min  Charges:    $Gait Training: 8-22 mins PT General Charges $$ ACUTE PT VISIT: 1 Visit                     [name], DPT 12/27/2022, 4:52 PM

## 2022-12-27 NOTE — TOC Progression Note (Signed)
Transition of Care [facility]) - Progression Note    Patient Details  Name: AMISH DEFENBAUGH MRN: [id number] Date of Birth: 09-11-1955  [facility]) CM/SW Contact  Marlowe Sax, RN Phone Number: 12/27/2022, 10:39 AM  Clinical Narrative:    [facility] [place] at [phone] and spoke with Cristy she requested a faxed referral to [phone], I faxed a new refrral Resent to [employer] in [place] as they are Pending as well   Expected Discharge Plan: Skilled Nursing Facility Barriers to Discharge: Other (must enter comment) (Attempting to find SNF or LTC options as patient stated he cannot care for himself.)  Expected Discharge Plan and Services                                               Social Determinants of Health (SDOH) Interventions SDOH Screenings   Food Insecurity: No Food Insecurity (12/01/2022)  Housing: Patient Declined (12/01/2022)  Transportation Needs: No Transportation Needs (12/01/2022)  Utilities: Not At Risk (12/01/2022)  Depression (PHQ2-9): Low Risk  (06/07/2021)  Financial Resource Strain: High Risk (11/21/2022)   Received from [facility]  Physical Activity: Insufficiently Active (05/03/2017)  Social Connections: Patient Declined (11/20/2022)   Received from [facility]  Stress: Stress Concern Present (11/20/2022)   Received from [facility]  Tobacco Use: Medium Risk (12/01/2022)    Readmission Risk Interventions    11/05/2022    2:59 PM 11/05/2022   12:16 PM 12/31/2021    8:58 AM  Readmission Risk Prevention Plan  Transportation Screening Complete Complete Complete  Medication Review Oceanographer) Complete Complete Complete  PCP or Specialist appointment within 3-5 days of discharge Complete Complete Complete  HRI or Home Care Consult Complete Complete Complete  SW Recovery Care/Counseling Consult Complete Complete Complete  Palliative Care Screening Complete Complete Complete  Skilled Nursing Facility Not  Applicable Not Applicable Not Applicable

## 2022-12-27 NOTE — Plan of Care (Signed)

## 2022-12-27 NOTE — Progress Notes (Signed)
Progress Note   Patient: Gary [name] [id number] DOB: December 13, 1955 DOA: 11/28/2022     27 DOS: the patient was seen and examined on 12/27/2022   Brief hospital course: DHAIRYA [name] is a 67 y.o. 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   Gary [name] stated that he ran out of his home torsemide several days ago due to a mixup with his pharmacy.  Then over the last couple days, he has noticed increasing lower extremity swelling, shortness of breath and chest pain.  He states that he usually develops chest pain when he has "fluid on his lungs."    Patient was initially diuresed with IV diuresis, responded well.  Significant improvement in peripheral edema.  IV Lasix was switched with home torsemide once creatinine started increasing.  Patient remained stable from respiratory standpoint point and remained on 6 to 8 L of supplemental oxygen via trach collar which is his baseline.  Patient developed Pseudomonas tracheitis, which was treated with Levaquin as he developed some pain around his tracheostomy.  Patient condition has stabilized, pending long-term care placement.    9/11: Started working with PT, hopefully can go home with home health soon.  Still no bed offers.  9/12: Remained hemodynamically stable.  Still does not think that he can go home without a lot of help, it seems unrealistic as no home health can provide that much of help.  TOC still working on placement.  Principal Problem:   Acute on chronic diastolic CHF (congestive heart failure) ([name]) Active Problems:   Chronic respiratory failure with hypoxia and hypercapnia ([name])   Chest pain   COPD (chronic obstructive pulmonary disease) ([name])   Chronic kidney disease, stage 3a ([name])   Hypotension    OSA (obstructive sleep apnea)   Morbid obesity with BMI of 50.0-59.9, adult ([name])   Chronic colitis   Fluid overload   Assessment and Plan: * Acute on chronic diastolic CHF (congestive heart failure) ([name]) Patient is presenting with increased work of breathing, shortness of breath and evidence of hypervolemia on examination.  CXR showed underinflation with bilateral areas of scarring or atelectatic change, and vascular congestion. Recent echocardiogram in July 2024 with preserved EF at 55-60% and grade 1 diastolic dysfunction. Patient condition improved after initial treatment with IV furosemide, currently on oral torsemide -Continue p.o. torsemide at 40 mg daily   Chronic respiratory failure with hypoxia and hypercapnia ([name]) Is on 6 to 8 L of oxygen over trach collar, stable.  Pseudomonas tracheitis Patient currently symptomatic reporting pain and tenderness with light manipulation of tracheostomy.  He is chronically colonized but reports unusual symptoms. Patient has completed Levaquin. -ENT consult -no further recommendations    Severe COPD (chronic obstructive pulmonary disease) ([name]) Severe COPD on triple bronchodilator.  Current shortness of breath most likely secondary to heart failure, rather than COPD though. --home Pulmicort neb d/c'ed by pulm.  Chronic home prednisone 10 mg daily was reduced to 5 mg daily by pulm.    Chronic kidney disease, stage 3a ([name]) --Seems stable --monitor   Hypotension Blood pressure again soft, his home midodrine was initially discontinued when blood pressure remained little elevated. -Restart low-dose midodrine at 2.5 mg 3 times daily  OSA (obstructive sleep apnea) --cont home vent for night-time use   Chronic colitis -  Continue home mesalamine   Morbid obesity with BMI of 50.0-59.9, adult [facility]) Patient has a history of morbid obesity with a BMI of 52, complicating his current medical care.   Chest pain, ACS ruled out Noncardiac in  nature.   Anxiety Continue hydroxyzine as needed. Stress   Subjective:  Patient was quite agitated and cursing nursing staff as they did not listen to his needs immediately.  Tried explaining that he has to be courteous.  Physical Exam: Vitals:   12/27/22 0133 12/27/22 0749 12/27/22 0827 12/27/22 1228  BP: 109/64  107/70   Pulse: 80 81 81 80  Resp: 20 20 17 20   Temp: 98.4 F (36.9 C)  98.1 F (36.7 C)   TempSrc:      SpO2: 96% 93% 95% 92%  Weight:      Height:       General.  Morbidly obese gentleman, in no acute distress.  Trach collar in place Pulmonary.  Lungs clear bilaterally, normal respiratory effort. CV.  Regular rate and rhythm, no JVD, rub or murmur. Abdomen.  Soft, nontender, nondistended, BS positive. CNS.  Alert and oriented .  No focal neurologic deficit. Extremities.  No edema, no cyanosis, pulses intact and symmetrical. Psychiatry.  Judgment and insight appears normal.   Data Reviewed: Prior data reviewed  Family Communication: None  Disposition: Status is: Inpatient Remains inpatient appropriate because: Unsafe discharge option.  DVT prophylaxis.  Eliquis Time spent: 39 minutes  This record has been created using Conservation officer, historic buildings. Errors have been sought and corrected,but may not always be located. Such creation errors do not reflect on the standard of care.   Author: Arnetha Courser, MD 12/27/2022 2:29 PM  For on call review [email or web address].

## 2022-12-28 DIAGNOSIS — E8770 Fluid overload, unspecified: Secondary | ICD-10-CM | POA: Diagnosis not present

## 2022-12-28 DIAGNOSIS — J9602 Acute respiratory failure with hypercapnia: Secondary | ICD-10-CM | POA: Diagnosis not present

## 2022-12-28 DIAGNOSIS — J441 Chronic obstructive pulmonary disease with (acute) exacerbation: Secondary | ICD-10-CM | POA: Diagnosis not present

## 2022-12-28 DIAGNOSIS — I5033 Acute on chronic diastolic (congestive) heart failure: Secondary | ICD-10-CM | POA: Diagnosis not present

## 2022-12-28 DIAGNOSIS — J9601 Acute respiratory failure with hypoxia: Secondary | ICD-10-CM | POA: Diagnosis not present

## 2022-12-28 LAB — BASIC METABOLIC PANEL
Anion gap: 8 (ref 5–15)
BUN: 39 mg/dL — ABNORMAL HIGH (ref 8–23)
CO2: 32 mmol/L (ref 22–32)
Calcium: 8.4 mg/dL — ABNORMAL LOW (ref 8.9–10.3)
Chloride: 98 mmol/L (ref 98–111)
Creatinine, Ser: 1.36 mg/dL — ABNORMAL HIGH (ref 0.61–1.24)
GFR, Estimated: 58 mL/min — ABNORMAL LOW (ref >60)
Glucose, Bld: 97 mg/dL (ref 70–99)
Potassium: 4.8 mmol/L (ref 3.5–5.1)
Sodium: 138 mmol/L (ref 135–145)

## 2022-12-28 LAB — CBC
HCT: 39.7 % (ref 39.0–52.0)
Hemoglobin: 10.6 g/dL — ABNORMAL LOW (ref 13.0–17.0)
MCH: 20.7 pg — ABNORMAL LOW (ref 26.0–34.0)
MCHC: 26.7 g/dL — ABNORMAL LOW (ref 30.0–36.0)
MCV: 77.5 fL — ABNORMAL LOW (ref 80.0–100.0)
Platelets: 220 10*3/uL (ref 150–400)
RBC: 5.12 MIL/uL (ref 4.22–5.81)
RDW: 18.5 % — ABNORMAL HIGH (ref 11.5–15.5)
WBC: 7.2 10*3/uL (ref 4.0–10.5)
nRBC: 0.3 % — ABNORMAL HIGH (ref 0.0–0.2)

## 2022-12-28 NOTE — Progress Notes (Signed)
PROGRESS NOTE    Gary [name]  UJW:[id number] DOB: 1955-09-02 DOA: 11/28/2022 PCP: Shayne Alken, MD  [id number]  LOS: 28 days   Brief hospital course:   Assessment & Plan: Gary [name] is a 67 y.o. 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   Gary [name] that he ran out of his home torsemide several days ago due to a mixup with his pharmacy.  Then over the last couple days, he has noticed increasing lower extremity swelling, shortness of breath and chest pain.  He states that he usually develops chest pain when he has "fluid on his lungs."     Patient was initially diuresed with IV diuresis, responded well.  Significant improvement in peripheral edema.  IV Lasix was switched with home torsemide once creatinine started increasing.   Patient remained stable from respiratory standpoint point and remained on 6 to 8 L of supplemental oxygen via trach collar which is his baseline.   Patient developed Pseudomonas tracheitis, which was treated with Levaquin as he developed some pain around his tracheostomy.   Patient condition has stabilized, pending long-term care placement.     9/11: Started working with PT, hopefully can go home with home health soon.  Still no bed offers.   9/12: Remained hemodynamically stable.  Still does not think that he can go home without a lot of help, it seems unrealistic as no home health can provide that much of help.  TOC still working on placement.  * Acute on chronic diastolic CHF (congestive heart failure) ([name]) Patient is presenting with increased work of breathing, shortness of breath and evidence of hypervolemia on examination.  CXR showed underinflation with bilateral areas of scarring or atelectatic change,  and vascular congestion. Recent echocardiogram in July 2024 with preserved EF at 55-60% and grade 1 diastolic dysfunction. Patient condition improved after initial treatment with IV furosemide, currently on oral torsemide -Continue p.o. torsemide at 40 mg daily   Chronic respiratory failure with hypoxia and hypercapnia ([name]) Is on 6 to 8 L of oxygen over trach collar, stable.  Pseudomonas tracheitis Patient currently symptomatic reporting pain and tenderness with light manipulation of tracheostomy.  He is chronically colonized but reports unusual symptoms. Patient has completed Levaquin. -ENT consult -no further recommendations    Severe COPD (chronic obstructive pulmonary disease) ([name]) Severe COPD on triple bronchodilator.  Current shortness of breath most likely secondary to heart failure, rather than COPD though. --home Pulmicort neb d/c'ed by pulm.  Chronic home prednisone 10 mg daily was reduced to 5 mg daily by pulm.     Chronic kidney disease, stage 3a ([name]) --Seems stable --monitor   Hypotension Blood pressure again soft, his home midodrine was initially discontinued when blood pressure remained little elevated. -Restart low-dose midodrine at 2.5 mg 3 times daily   OSA (obstructive sleep apnea) --cont home vent for night-time use   Chronic colitis - Continue home mesalamine   Morbid obesity with BMI of 50.0-59.9, adult [facility]) Patient has a history of morbid obesity with a BMI of 52, complicating his current medical care.   Chest pain, ACS ruled out Noncardiac in nature.   Anxiety Continue hydroxyzine as needed.    DVT prophylaxis: FA:OZHYQMV Code Status: Full code  Family Communication:  Level of care: Med-Surg Dispo:  The patient is from: home Anticipated d/c is to: to be determined Anticipated d/c date is: whenever there is safe disposition   Subjective and Interval History:  Pt was seen walking around nurses' station with PT  today.   Objective: Vitals:   12/27/22 2342 12/28/22 0733 12/28/22 0804 12/28/22 1129  BP: 112/75  109/67   Pulse: 84 89 87 91  Resp:  20  20  Temp: 98.5 F (36.9 C)  98.6 F (37 C)   TempSrc:      SpO2: 94% 93% 92% 90%  Weight:      Height:        Intake/Output Summary (Last 24 hours) at 12/28/2022 1841 Last data filed at 12/28/2022 1750 Gross per 24 hour  Intake 240 ml  Output 2750 ml  Net -2510 ml   Filed Weights   12/03/22 0457 12/09/22 0500 12/24/22 0600  Weight: (!) 192.1 kg (!) 195 kg (!) 220.6 kg    Examination:   Constitutional: NAD, AAOx3 HEENT: conjunctivae and lids normal, EOMI CV: No cyanosis.   RESP: trach present Neuro: II - XII grossly intact.   Psych: Normal mood and affect.     Data Reviewed: I have personally reviewed labs and imaging studies  Time spent: 25 minutes  Darlin Priestly, MD [facility] If 7PM-7AM, please contact night-coverage 12/28/2022, 6:41 PM

## 2022-12-28 NOTE — Plan of Care (Signed)

## 2022-12-28 NOTE — TOC Progression Note (Signed)
Transition of Care [facility]) - Progression Note    Patient Details  Name: Gary [name] MRN: [id number] Date of Birth: December 15, 1955  Transition of Care [facility]) CM/SW Contact  Marlowe Sax, RN Phone Number: 12/28/2022, 2:26 PM  Clinical Narrative:    No bed offers at this time [facility] will continue working with the patient on DC plan and needs, he is up in the hall working with PT currently   Expected Discharge Plan: Skilled Nursing Facility Barriers to Discharge: Other (must enter comment) (Attempting to find SNF or LTC options as patient stated he cannot care for himself.)  Expected Discharge Plan and Services                                               Social Determinants of Health (SDOH) Interventions SDOH Screenings   Food Insecurity: No Food Insecurity (12/01/2022)  Housing: Patient Declined (12/01/2022)  Transportation Needs: No Transportation Needs (12/01/2022)  Utilities: Not At Risk (12/01/2022)  Depression (PHQ2-9): Low Risk  (06/07/2021)  Financial Resource Strain: High Risk (11/21/2022)   Received from [facility]  Physical Activity: Insufficiently Active (05/03/2017)  Social Connections: Patient Declined (11/20/2022)   Received from [facility]  Stress: Stress Concern Present (11/20/2022)   Received from [facility]  Tobacco Use: Medium Risk (12/01/2022)    Readmission Risk Interventions    11/05/2022    2:59 PM 11/05/2022   12:16 PM 12/31/2021    8:58 AM  Readmission Risk Prevention Plan  Transportation Screening Complete Complete Complete  Medication Review Oceanographer) Complete Complete Complete  PCP or Specialist appointment within 3-5 days of discharge Complete Complete Complete  HRI or Home Care Consult Complete Complete Complete  SW Recovery Care/Counseling Consult Complete Complete Complete  Palliative Care Screening Complete Complete Complete  Skilled Nursing Facility Not Applicable Not Applicable Not Applicable

## 2022-12-28 NOTE — Progress Notes (Signed)
Pt is verbally abusive to staff on shift. First event happened this morning. Pt called my number while this nurse was helping another pt. Pt stated that he had call the desk and he had been waiting for help for three minutes. This nurse attempted to educate the pt. about the need of other patients and that we will get to him as soon as we can when he calls for help. Pt started yelling and talking over this Clinical research associate. Pt. gets easily irritated, argumentative, and verbally abusive towards CNAs and nurses. Pt. demands for staff to be present in his room as soon as he calls the desk; if not present "quick", pt will start yelling and cursing at staff.  Later on shift, pt had a BM, this nurse went into his room to help him and clean him. When I was finish cleaning him, patient stated that he was still full of stool and asked for a washcloth. Pt took the washcloth and re cleaned his buttocks, he than, demanded me to clean him again; as he though that he was still dirty. I educated the Pt. and encouraged/promoted independence as Pt can clearly clean himself. Pt did not agree and replied "I don't like you, I need to speak to the charge nurse" Pt got on his phone and started calling the charge nurse. This nurse left the room. Management made aware of Pt. behaviors.

## 2022-12-28 NOTE — Progress Notes (Signed)
Mobility Specialist - Progress Note   12/28/22 1642  Mobility  Activity Transferred from chair to bed  Level of Assistance +2 (takes two people)  Assistive Device  Secondary school teacher)  RLE Weight Bearing NWB  Activity Response Tolerated well  $Mobility charge 1 Mobility  Mobility Specialist Start Time (ACUTE ONLY) 1619  Mobility Specialist Stop Time (ACUTE ONLY) 1636  Mobility Specialist Time Calculation (min) (ACUTE ONLY) 17 min   Pt sitting in the recliner upon entry, requesting to transfer to bed. Pt transferred to the bed via [facility] lift +2 for safety. Pt left in fowler position with needs within reach.  Zetta Bills Mobility Specialist 12/28/22 4:48 PM

## 2022-12-28 NOTE — Progress Notes (Signed)
Occupational Therapy Treatment Patient Details Name: Gary [name] MRN: [id number] DOB: [birth year] Today's Date: 12/28/2022   History of present illness 67 y/o male presented to [facility] ED on 11/28/22 for chest pain x 4 days. Admitted for acute on chronic CHF. Frequent admissions this year with most recent dsicharge on 11/21/22. PMH includes obesity, hypoxia, respiratory failure with tracheostomy, COPD, GIB, HFpEF.   OT comments  Mr Samek was seen for OT/PT co-treatment on this date. Upon arrival to room pt reclined in bed, agreeable to tx. Pt requires MIN A exit bed, MAX A don B shoes in sitting. SUPERVISION + RW for functional mobility, ~70 ft. SpO2 85% on 6L trach collar following mobility, resolved to 90% with seated rest break. Pt making good progress toward goals, will continue to follow POC. Discharge recommendation remains appropriate.        If plan is discharge home, recommend the following:  A little help with walking and/or transfers;A lot of help with bathing/dressing/bathroom   Equipment Recommendations  None recommended by OT    Recommendations for Other Services      Precautions / Restrictions Precautions Precautions: Fall Precaution Comments: trach collar/ Trach Restrictions Weight Bearing Restrictions: No       Mobility Bed Mobility Overal bed mobility: Needs Assistance Bed Mobility: Supine to Sit Rolling: Min assist              Transfers Overall transfer level: Needs assistance Equipment used: Rolling walker (2 wheels) Transfers: Sit to/from Stand Sit to Stand: From elevated surface, Supervision                 Balance Overall balance assessment: Needs assistance Sitting-balance support: Feet supported, No upper extremity supported Sitting balance-[name] Scale: Good     Standing balance support: Bilateral upper extremity supported, Reliant on assistive device for balance Standing balance-[name] Scale: Fair                              ADL either performed or assessed with clinical judgement   ADL Overall ADL's : Needs assistance/impaired                                       General ADL Comments: MAX A don B shoes in sitting. SUPERVISION + RW for functional mobility.       Cognition Arousal: Alert Behavior During Therapy: WFL for tasks assessed/performed, Anxious Overall Cognitive Status: Within Functional Limits for tasks assessed                                                General Comments SpO2 85% on 6L trach collar following mobility, resolved to 9% with seated rest break    Pertinent Vitals/ Pain       Pain Assessment Pain Assessment: No/denies pain   Frequency  Min 1X/week        Progress Toward Goals  OT Goals(current goals can now be found in the care plan section)  Progress towards OT goals: Progressing toward goals  Acute Rehab OT Goals Patient Stated Goal: to walk a lap OT Goal Formulation: With patient Time For Goal Achievement: 01/10/23 Potential to Achieve Goals: Good ADL Goals Pt Will Perform Grooming: with modified independence;standing Pt  Will Transfer to Toilet: with modified independence;ambulating;regular height toilet Pt Will Perform Toileting - Clothing Manipulation and hygiene: with mod assist;sit to/from stand  Plan Discharge plan remains appropriate;Frequency remains appropriate    Co-evaluation    PT/OT/SLP Co-Evaluation/Treatment: Yes Reason for Co-Treatment: For patient/therapist safety;To address functional/ADL transfers PT goals addressed during session: Mobility/safety with mobility;Balance;Proper use of DME;Strengthening/ROM OT goals addressed during session: ADL's and self-care      AM-PAC OT "6 Clicks" Daily Activity     Outcome Measure   Help from another person eating meals?: None Help from another person taking care of personal grooming?: A Little Help from another person toileting, which includes using  toliet, bedpan, or urinal?: A Lot Help from another person bathing (including washing, rinsing, drying)?: A Lot Help from another person to put on and taking off regular upper body clothing?: None Help from another person to put on and taking off regular lower body clothing?: A Lot 6 Click Score: 17    End of Session Equipment Utilized During Treatment: Oxygen;Rolling walker (2 wheels)  OT Visit Diagnosis: Unsteadiness on feet (R26.81);Other abnormalities of gait and mobility (R26.89);Muscle weakness (generalized) (M62.81);Adult, failure to thrive (R62.7)   Activity Tolerance Patient tolerated treatment well   Patient Left with call bell/phone within reach;in chair   Nurse Communication Mobility status        Time: 3086-5784 OT Time Calculation (min): 19 min  Charges: OT General Charges $OT Visit: 1 Visit OT Treatments $Self Care/Home Management : 8-22 mins  Kathie Dike, M.S. OTR/L  12/28/22, 2:41 PM  ascom [phone]

## 2022-12-28 NOTE — Plan of Care (Signed)
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
## 2022-12-28 NOTE — Progress Notes (Signed)
Physical Therapy Treatment Patient Details Name: Gary [name] MRN: [id number] DOB: [birth year] Today's Date: 12/28/2022   History of Present Illness 67 y/o male presented to [facility] ED on 11/28/22 for chest pain x 4 days. Admitted for acute on chronic CHF. Frequent admissions this year with most recent dsicharge on 11/21/22. PMH includes obesity, hypoxia, respiratory failure with tracheostomy, COPD, GIB, HFpEF.    PT Comments  PT/OT co-treat 2/2 to pt's limited activity tolerance ned for +2 assist. Pt agrees to session but continues to like to dictate session progression. Pt did get OOB on L side but required min assist due to core weakness. Once in sitting, pt able to sit EOB without assistance. Stood from elevated bed height to BRW an ambulate ~ 70 ft without LOB. Distance limited by fatigue. Pt desaturates to 85% on 6L mobile trach collar. Encouraged increased activity throughout the day. Will continue to follow and progress per current POC.    If plan is discharge home, recommend the following: A little help with walking and/or transfers;A lot of help with bathing/dressing/bathroom;Assist for transportation     Equipment Recommendations  Other (comment) (pt endorses having equipmwnt needs being met)       Precautions / Restrictions Precautions Precautions: Fall Precaution Comments: trach collar/ Trach Restrictions Weight Bearing Restrictions: No     Mobility  Bed Mobility Overal bed mobility: Needs Assistance Bed Mobility: Supine to Sit Rolling: Supervision Supine to sit: Min assist   Transfers Overall transfer level: Needs assistance Equipment used: Rolling walker (2 wheels) Transfers: Sit to/from Stand Sit to Stand: Supervision  General transfer comment: Pt was able to stand from elevated EOB surface with supervision.    Ambulation/Gait Ambulation/Gait assistance: Supervision Gait Distance (Feet): 70 Feet Assistive device: Rolling walker (2 wheels) (Bariatric) Gait  Pattern/deviations: Step-through pattern, Wide base of support Gait velocity: decreased  General Gait Details: Pt was able to ambulate ~ 11ft with RW + chair follow. one standing rest. Did desaturate even on 6L moble trach collar but quickly recovers with seated rest.   Balance Overall balance assessment: Needs assistance Sitting-balance support: Feet supported, No upper extremity supported Sitting balance-[name] Scale: Good     Standing balance support: Bilateral upper extremity supported, Reliant on assistive device for balance Standing balance-[name] Scale: Fair       Cognition Arousal: Alert Behavior During Therapy: WFL for tasks assessed/performed, Anxious Overall Cognitive Status: Within Functional Limits for tasks assessed      General Comments: Pt was A and O but continues to liek to dictate session progression. Particular about care/needs. Lots of TLC           General Comments General comments (skin integrity, edema, etc.): pt desaturates to 85% on 6 L mobile trach collar      Pertinent Vitals/Pain Pain Assessment Pain Assessment: 0-10 Pain Score: 3  Pain Location: elbow/arm pain once fatigued     PT Goals (current goals can now be found in the care plan section) Acute Rehab PT Goals Patient Stated Goal: to get stronger Progress towards PT goals: Progressing toward goals    Frequency    Min 1X/week      PT Plan Current plan remains appropriate    Co-evaluation PT/OT/SLP Co-Evaluation/Treatment: Yes Reason for Co-Treatment: Complexity of the patient's impairments (multi-system involvement);Necessary to address cognition/behavior during functional activity;For patient/therapist safety;To address functional/ADL transfers PT goals addressed during session: Mobility/safety with mobility;Proper use of DME;Balance;Strengthening/ROM OT goals addressed during session: ADL's and self-care      AM-PAC  PT "6 Clicks" Mobility   Outcome Measure  Help needed  turning from your back to your side while in a flat bed without using bedrails?: None Help needed moving from lying on your back to sitting on the side of a flat bed without using bedrails?: A Little Help needed moving to and from a bed to a chair (including a wheelchair)?: A Little Help needed standing up from a chair using your arms (e.g., wheelchair or bedside chair)?: A Little Help needed to walk in hospital room?: A Little Help needed climbing 3-5 steps with a railing? : Total 6 Click Score: 17    End of Session Equipment Utilized During Treatment: Oxygen (trach collar) Activity Tolerance: Patient tolerated treatment well;Patient limited by fatigue Patient left: with call bell/phone within reach;in chair;with chair alarm set Nurse Communication: Mobility status PT Visit Diagnosis: Muscle weakness (generalized) (M62.81);Difficulty in walking, not elsewhere classified (R26.2)     Time: 2130-8657 PT Time Calculation (min) (ACUTE ONLY): 26 min  Charges:    $Gait Training: 8-22 mins PT General Charges $$ ACUTE PT VISIT: 1 Visit                     Jetta Lout PTA 12/28/22, 4:04 PM

## 2022-12-29 DIAGNOSIS — E8770 Fluid overload, unspecified: Secondary | ICD-10-CM | POA: Diagnosis not present

## 2022-12-29 DIAGNOSIS — J9602 Acute respiratory failure with hypercapnia: Secondary | ICD-10-CM | POA: Diagnosis not present

## 2022-12-29 DIAGNOSIS — J9601 Acute respiratory failure with hypoxia: Secondary | ICD-10-CM | POA: Diagnosis not present

## 2022-12-29 DIAGNOSIS — J441 Chronic obstructive pulmonary disease with (acute) exacerbation: Secondary | ICD-10-CM | POA: Diagnosis not present

## 2022-12-29 DIAGNOSIS — I5033 Acute on chronic diastolic (congestive) heart failure: Secondary | ICD-10-CM | POA: Diagnosis not present

## 2022-12-29 NOTE — Progress Notes (Signed)
PROGRESS NOTE    Gary [name]  WGN:[id number] DOB: Sep 30, 1955 DOA: 11/28/2022 PCP: Shayne Alken, MD  [id number]/[id number]-AA  LOS: 29 days   Brief hospital course:   Assessment & Plan: Gary [name] is a 67 y.o. 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   Gary [name] stated that he ran out of his home torsemide several days ago due to a mixup with his pharmacy.  Then over the last couple days, he has noticed increasing lower extremity swelling, shortness of breath and chest pain.  He states that he usually develops chest pain when he has "fluid on his lungs."     Patient was initially diuresed with IV diuresis, responded well.  Significant improvement in peripheral edema.  IV Lasix was switched with home torsemide once creatinine started increasing.   Patient remained stable from respiratory standpoint point and remained on 6 to 8 L of supplemental oxygen via trach collar which is his baseline.   Patient developed Pseudomonas tracheitis, which was treated with Levaquin as he developed some pain around his tracheostomy.   Patient condition has stabilized, pending long-term care placement.     9/11: Started working with PT, hopefully can go home with home health soon.  Still no bed offers.   9/12: Remained hemodynamically stable.  Still does not think that he can go home without a lot of help, it seems unrealistic as no home health can provide that much of help.  TOC still working on placement.  * Acute on chronic diastolic CHF (congestive heart failure) ([name]) Patient is presenting with increased work of breathing, shortness of breath and evidence of hypervolemia on examination.  CXR showed underinflation with bilateral areas of scarring or atelectatic change,  and vascular congestion. Recent echocardiogram in July 2024 with preserved EF at 55-60% and grade 1 diastolic dysfunction. Patient condition improved after initial treatment with IV furosemide, currently on oral torsemide -Continue p.o. torsemide at 40 mg daily   Chronic respiratory failure with hypoxia and hypercapnia ([name]) Is on 6 to 8 L of oxygen over trach collar, stable.  Pseudomonas tracheitis Patient currently symptomatic reporting pain and tenderness with light manipulation of tracheostomy.  He is chronically colonized but reports unusual symptoms. Patient has completed Levaquin. -ENT consult -no further recommendations    Severe COPD (chronic obstructive pulmonary disease) ([name]) Severe COPD on triple bronchodilator.  Current shortness of breath most likely secondary to heart failure, rather than COPD though. --home Pulmicort neb d/c'ed by pulm.  Chronic home prednisone 10 mg daily was reduced to 5 mg daily by pulm.     Chronic kidney disease, stage 3a ([name]) --Seems stable --monitor   Hypotension Blood pressure again soft, his home midodrine was initially discontinued when blood pressure remained little elevated. -Restart low-dose midodrine at 2.5 mg 3 times daily   OSA (obstructive sleep apnea) --cont home vent for night-time use   Chronic colitis - Continue home mesalamine   Morbid obesity with BMI of 50.0-59.9, adult [facility]) Patient has a history of morbid obesity with a BMI of 52, complicating his current medical care.   Chest pain, ACS ruled out Noncardiac in nature.   Anxiety Continue hydroxyzine as needed.    DVT prophylaxis: HQ:IONGEXB Code Status: Full code  Family Communication:  Level of care: Med-Surg Dispo:  The patient is from: home Anticipated d/c is to: to be determined Anticipated d/c date is: whenever there is safe disposition   Subjective and Interval History:  Pt reported little blood in his trach, and said he didn't think he was on abx  long enough for his infection.   Objective: Vitals:   12/28/22 2310 12/29/22 0100 12/29/22 0821 12/29/22 1838  BP:  112/75 103/66 115/74  Pulse:  85 83 82  Resp:  18 17   Temp:  98.4 F (36.9 C) 98.9 F (37.2 C) 98 F (36.7 C)  TempSrc:  Oral Oral   SpO2: 93% 95% 91% 97%  Weight:      Height:        Intake/Output Summary (Last 24 hours) at 12/29/2022 1858 Last data filed at 12/29/2022 1030 Gross per 24 hour  Intake 240 ml  Output 2400 ml  Net -2160 ml   Filed Weights   12/03/22 0457 12/09/22 0500 12/24/22 0600  Weight: (!) 192.1 kg (!) 195 kg (!) 220.6 kg    Examination:   Constitutional: NAD, AAOx3 HEENT: conjunctivae and lids normal, EOMI CV: No cyanosis.   RESP: trach present Neuro: II - XII grossly intact.   Psych: flat mood and affect.     Data Reviewed: I have personally reviewed labs and imaging studies  Time spent: 25 minutes  Darlin Priestly, MD [facility] If 7PM-7AM, please contact night-coverage 12/29/2022, 6:58 PM

## 2022-12-29 NOTE — Progress Notes (Signed)
Mobility Specialist - Progress Note   12/29/22 1338  Mobility  Activity Stood at bedside;Dangled on edge of bed;Transferred from bed to chair;Ambulated with assistance in room  Level of Assistance Contact guard assist, steadying assist  Assistive Device Front wheel walker  Distance Ambulated (ft) 2 ft  Activity Response Tolerated well  Mobility Referral Yes  $Mobility charge 1 Mobility  Mobility Specialist Start Time (ACUTE ONLY) 1259  Mobility Specialist Stop Time (ACUTE ONLY) 1331  Mobility Specialist Time Calculation (min) (ACUTE ONLY) 32 min   Pt supine in bed on Trach upon arrival. Pt completes bed mobility HHA with extra time. Pt STS and ambulates from bedside to recliner CGA +2 for safety. Pt left in recliner with needs in reach.  [name]  Mobility Specialist  12/29/22 1:40 PM
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
## 2022-12-29 NOTE — Progress Notes (Signed)
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 Pt left in bed with needs in reach.   [name]  Mobility Specialist  12/29/22 3:22 PM

## 2022-12-30 ENCOUNTER — Inpatient Hospital Stay: Payer: Medicare HMO

## 2022-12-30 DIAGNOSIS — J9811 Atelectasis: Secondary | ICD-10-CM | POA: Diagnosis not present

## 2022-12-30 DIAGNOSIS — I517 Cardiomegaly: Secondary | ICD-10-CM | POA: Diagnosis not present

## 2022-12-30 DIAGNOSIS — E8770 Fluid overload, unspecified: Secondary | ICD-10-CM | POA: Diagnosis not present

## 2022-12-30 DIAGNOSIS — R918 Other nonspecific abnormal finding of lung field: Secondary | ICD-10-CM | POA: Diagnosis not present

## 2022-12-30 DIAGNOSIS — J9602 Acute respiratory failure with hypercapnia: Secondary | ICD-10-CM | POA: Diagnosis not present

## 2022-12-30 DIAGNOSIS — I5033 Acute on chronic diastolic (congestive) heart failure: Secondary | ICD-10-CM | POA: Diagnosis not present

## 2022-12-30 DIAGNOSIS — J441 Chronic obstructive pulmonary disease with (acute) exacerbation: Secondary | ICD-10-CM | POA: Diagnosis not present

## 2022-12-30 DIAGNOSIS — J9601 Acute respiratory failure with hypoxia: Secondary | ICD-10-CM | POA: Diagnosis not present

## 2022-12-30 MED ORDER — BACITRACIN ZINC 500 UNIT/GM EX OINT
TOPICAL_OINTMENT | Freq: Every day | CUTANEOUS | Status: DC
  Administered 2022-12-31 – 2023-04-23 (×83): 1 via TOPICAL
  Filled 2022-12-30 (×112): qty 0.9
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
## 2022-12-30 NOTE — Progress Notes (Signed)
PROGRESS NOTE    Gary [name]  ZOX:[id number] DOB: [birth year] DOA: 11/28/2022 PCP: Shayne Alken, MD  158A/158A-AA  LOS: 30 days   Brief hospital course:   Assessment & Plan: Gary [name] is a 67 y.o. 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   Gary [name] stated that he ran out of his home torsemide several days ago due to a mixup with his pharmacy.  Then over the last couple days, he has noticed increasing lower extremity swelling, shortness of breath and chest pain.  He states that he usually develops chest pain when he has "fluid on his lungs."     Patient was initially diuresed with IV diuresis, responded well.  Significant improvement in peripheral edema.  IV Lasix was switched with home torsemide once creatinine started increasing.   Patient remained stable from respiratory standpoint point and remained on 6 to 8 L of supplemental oxygen via trach collar which is his baseline.   Patient developed Pseudomonas tracheitis, which was treated with Levaquin as he developed some pain around his tracheostomy.   Patient condition has stabilized, pending long-term care placement.     9/11: Started working with PT, hopefully can go home with home health soon.  Still no bed offers.   9/12: Remained hemodynamically stable.  Still does not think that he can go home without a lot of help, it seems unrealistic as no home health can provide that much of help.  TOC still working on placement.  * Acute on chronic diastolic CHF (congestive heart failure) ([name]) Patient is presenting with increased work of breathing, shortness of breath and evidence of hypervolemia on examination.  CXR showed underinflation with bilateral areas of scarring or atelectatic change,  and vascular congestion. Recent echocardiogram in July 2024 with preserved EF at 55-60% and grade 1 diastolic dysfunction. Patient condition improved after initial treatment with IV furosemide, currently on oral torsemide -Continue p.o. torsemide at 40 mg daily   Chronic respiratory failure with hypoxia and hypercapnia ([name]) Is on 6 to 8 L of oxygen over trach collar, stable.  Pseudomonas tracheitis Patient reported pain and tenderness with light manipulation of tracheostomy.  He is chronically colonized but reported unusual symptoms. Patient has completed Levaquin. -ENT consult -no further recommendations  --pulm following   Severe COPD (chronic obstructive pulmonary disease) ([name]) Severe COPD on triple bronchodilator.  Current shortness of breath most likely secondary to heart failure, rather than COPD though. --home Pulmicort neb d/c'ed by pulm.  Chronic home prednisone 10 mg daily was reduced to 5 mg daily by pulm.     Chronic kidney disease, stage 3a ([name]) --Seems stable --intermittent monitor while on diuretics    Hypotension Blood pressure again soft, his home midodrine was initially discontinued when blood pressure remained little elevated. -cont low-dose midodrine at 2.5 mg 3 times daily   OSA (obstructive sleep apnea) --cont home vent for night-time use   Chronic colitis - Continue home mesalamine   Morbid obesity with BMI of 50.0-59.9, adult [facility]) Patient has a history of morbid obesity with a BMI of 52, complicating his current medical care.   Chest pain, ACS ruled out Noncardiac in nature.   Anxiety Continue hydroxyzine as needed.    DVT prophylaxis: [id number] Code Status: Full code  Family Communication:  Level of care: Med-Surg Dispo:   The patient is from: home Anticipated d/c is to: to be determined Anticipated d/c date is: whenever there is safe disposition   Subjective and Interval History:  Pt reported some bloody secretion from his trach, and  darker phlegm.   Objective: Vitals:   12/29/22 1838 12/29/22 2050 12/30/22 0811 12/30/22 1622  BP: 115/74  109/70 105/62  Pulse: 82  85 91  Resp:   16 20  Temp: 98 F (36.7 C)  98.7 F (37.1 C) 98.6 F (37 C)  TempSrc:      SpO2: 97% 94% 93% 90%  Weight:      Height:        Intake/Output Summary (Last 24 hours) at 12/30/2022 1703 Last data filed at 12/30/2022 1641 Gross per 24 hour  Intake --  Output 1150 ml  Net -1150 ml   Filed Weights   12/03/22 0457 12/09/22 0500 12/24/22 0600  Weight: (!) 192.1 kg (!) 195 kg (!) 220.6 kg    Examination:   Constitutional: NAD, AAOx3 HEENT: conjunctivae and lids normal, EOMI CV: No cyanosis.   RESP: trach present Neuro: II - XII grossly intact.   Psych: flat mood and affect.     Data Reviewed: I have personally reviewed labs and imaging studies  Time spent: 35 minutes  Darlin Priestly, MD [facility] If 7PM-7AM, please contact night-coverage 12/30/2022, 5:03 PM

## 2022-12-30 NOTE — Progress Notes (Signed)
Mobility Specialist - Progress Note   12/30/22 1405  Mobility  Activity Ambulated with assistance in room;Stood at bedside;Dangled on edge of bed  Level of Assistance Standby assist, set-up cues, supervision of patient - no hands on  Assistive Device Front wheel walker  Distance Ambulated (ft) 3 ft  Activity Response Tolerated well  Mobility Referral Yes  $Mobility charge 1 Mobility  Mobility Specialist Start Time (ACUTE ONLY) 1159  Mobility Specialist Stop Time (ACUTE ONLY) 1231  Mobility Specialist Time Calculation (min) (ACUTE ONLY) 32 min   Pt semi-supine in bed on Trach upon arrival. Pt completes bed mobility with HHA and extra time. Pt STS from heightened bed surface and ambulates to recliner SBA (+2 for safety). Pt left in recliner with needs in reach.  [name]  Mobility Specialist  12/30/22 2:07 PM

## 2022-12-30 NOTE — Progress Notes (Signed)
Mobility Specialist - Progress Note   12/30/22 1413  Mobility  Activity Transferred from chair to bed  Level of Assistance +2 (takes two people)  Assistive Device Other (Comment) Secondary school teacher)  Distance Ambulated (ft) 0 ft  Activity Response Tolerated well  Mobility Referral Yes  $Mobility charge 1 Mobility  Mobility Specialist Start Time (ACUTE ONLY) 1311  Mobility Specialist Stop Time (ACUTE ONLY) 1335  Mobility Specialist Time Calculation (min) (ACUTE ONLY) 24 min   [employer]  Mobility Specialist  12/30/22 2:14 PM

## 2022-12-30 NOTE — Plan of Care (Signed)

## 2022-12-30 NOTE — Progress Notes (Signed)
Pt refused metaneb for CPT. Instructed and demonstrated flutter for alternative.

## 2022-12-31 DIAGNOSIS — J9601 Acute respiratory failure with hypoxia: Secondary | ICD-10-CM | POA: Diagnosis not present

## 2022-12-31 DIAGNOSIS — J9602 Acute respiratory failure with hypercapnia: Secondary | ICD-10-CM | POA: Diagnosis not present

## 2022-12-31 DIAGNOSIS — I5033 Acute on chronic diastolic (congestive) heart failure: Secondary | ICD-10-CM | POA: Diagnosis not present

## 2022-12-31 DIAGNOSIS — E8770 Fluid overload, unspecified: Secondary | ICD-10-CM | POA: Diagnosis not present

## 2022-12-31 DIAGNOSIS — J441 Chronic obstructive pulmonary disease with (acute) exacerbation: Secondary | ICD-10-CM | POA: Diagnosis not present

## 2022-12-31 MED ORDER — IPRATROPIUM-ALBUTEROL 20-100 MCG/ACT IN AERS
1.0000 | INHALATION_SPRAY | Freq: Four times a day (QID) | RESPIRATORY_TRACT | Status: DC
  Administered 2022-12-31 – 2023-04-23 (×348): 1 via RESPIRATORY_TRACT
  Filled 2022-12-31 (×3): qty 4

## 2022-12-31 NOTE — Progress Notes (Signed)
PROGRESS NOTE    Gary [name]  ZOX:[id number] DOB: 1955/05/11 DOA: 11/28/2022 PCP: Shayne Alken, MD  [id number]/[id number]-AA  LOS: 31 days   Brief hospital course:   Assessment & Plan: Gary [name] is a 67 y.o. 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   Gary [name] stated that he ran out of his home torsemide several days ago due to a mixup with his pharmacy.  Then over the last couple days, he has noticed increasing lower extremity swelling, shortness of breath and chest pain.  He states that he usually develops chest pain when he has "fluid on his lungs."     Patient was initially diuresed with IV diuresis, responded well.  Significant improvement in peripheral edema.  IV Lasix was switched with home torsemide once creatinine started increasing.   Patient remained stable from respiratory standpoint point and remained on 6 to 8 L of supplemental oxygen via trach collar which is his baseline.   Patient developed Pseudomonas tracheitis, which was treated with Levaquin as he developed some pain around his tracheostomy.   Patient condition has stabilized, pending long-term care placement.     9/11: Started working with PT, hopefully can go home with home health soon.  Still no bed offers.   9/12: Remained hemodynamically stable.  Still does not think that he can go home without a lot of help, it seems unrealistic as no home health can provide that much of help.  TOC still working on placement.  * Acute on chronic diastolic CHF (congestive heart failure) ([name]) Patient is presenting with increased work of breathing, shortness of breath and evidence of hypervolemia on examination.  CXR showed underinflation with bilateral areas of scarring or atelectatic change,  and vascular congestion. Recent echocardiogram in July 2024 with preserved EF at 55-60% and grade 1 diastolic dysfunction. Patient condition improved after initial treatment with IV furosemide, currently on oral torsemide -Continue p.o. torsemide at 40 mg daily   Chronic respiratory failure with hypoxia and hypercapnia ([name]) Is on 6 to 8 L of oxygen over trach collar, stable.  Pseudomonas tracheitis Patient reported pain and tenderness with light manipulation of tracheostomy.  He is chronically colonized but reported unusual symptoms. Patient has completed Levaquin. -ENT consult -no further recommendations  --pulm following   Severe COPD (chronic obstructive pulmonary disease) ([name]) Severe COPD on triple bronchodilator.  Current shortness of breath most likely secondary to heart failure, rather than COPD though. --home Pulmicort neb d/c'ed by pulm.  Chronic home prednisone 10 mg daily was reduced to 5 mg daily by pulm.     Chronic kidney disease, stage 3a ([name]) --Seems stable --intermittent monitor while on diuretics    Hypotension Blood pressure again soft, his home midodrine was initially discontinued when blood pressure remained little elevated. -cont low-dose midodrine at 2.5 mg 3 times daily   OSA (obstructive sleep apnea) --cont home vent for night-time use   Chronic colitis - Continue home mesalamine   Morbid obesity with BMI of 50.0-59.9, adult [facility]) Patient has a history of morbid obesity with a BMI of 52, complicating his current medical care.   Chest pain, ACS ruled out Noncardiac in nature.   Anxiety Continue hydroxyzine as needed.    DVT prophylaxis: WJ:[id number] Code Status: Full code  Family Communication:  Level of care: Med-Surg Dispo:   The patient is from: home Anticipated d/c is to: to be determined Anticipated d/c date is: whenever there is safe disposition   Subjective and Interval History:  Pt was seen walking with PT.  Pt reported more blood  from his tracheostomy site.   Objective: Vitals:   12/31/22 0622 12/31/22 0732 12/31/22 1005 12/31/22 1532  BP:   127/75 111/65  Pulse:   84 85  Resp:   20 20  Temp:   98.2 F (36.8 C) (!) 97.4 F (36.3 C)  TempSrc:      SpO2: 95% 95% 93% 91%  Weight:      Height:        Intake/Output Summary (Last 24 hours) at 12/31/2022 1919 Last data filed at 12/31/2022 1541 Gross per 24 hour  Intake 960 ml  Output 1500 ml  Net -540 ml   Filed Weights   12/03/22 0457 12/09/22 0500 12/24/22 0600  Weight: (!) 192.1 kg (!) 195 kg (!) 220.6 kg    Examination:   Constitutional: NAD, AAOx3 HEENT: conjunctivae and lids normal, EOMI CV: No cyanosis.   RESP: trach present Neuro: II - XII grossly intact.      Data Reviewed: I have personally reviewed labs and imaging studies  Time spent: 25 minutes  Darlin Priestly, MD [facility] If 7PM-7AM, please contact night-coverage 12/31/2022, 7:19 PM

## 2022-12-31 NOTE — Progress Notes (Signed)
Nutrition Brief Note  RD consulted for diet education related to weight loss.   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   Pt admitted with CHF exacerbation.   Reviewed I/O's: -1.2 L x 24 hours and -22 L since 12/17/22  UOP: 2 L x 24 hours  Case discussed with MD and PT; requesting assistance with weight loss. Pt making minimal progress with mobility and PT is encouraging weight loss to help pt meet mobility goals. Pt may be able to qualify for SNF.   Pt sitting up in recliner chair at time of visit., on trach collar. Pt very familiar to this RD due to multiple previous admissions. Pt reports feeling well today. He is pleased with the progress he has made with therapy. Pt reports that he does "not eat that much" and his favorite meal is a [place] sandwich and tomato soup. He expresses frustration about being on a renal diet as he does not have access to many food choices on diet. Pt requested to be on a regular diet. RD discussed with pt his medical history (mainly CHF) and importance of sodium restriction to help manage CHF and fluid status. RD also discussed importance of diet compliance to help meet his goals and continued progress. RD liberalized diet to 2 gram sodium. Focused education on CHF, as this is why pt was initially admitted.   Discouraged intake of processed foods and use of  salt shaker. Encouraged fresh fruits and vegetables as well as whole grain sources of carbohydrates to maximize fiber intake.   RD discussed why it is important for patient to adhere to diet recommendations, and emphasized the role of fluids, foods to avoid, and importance of weighing self daily. Teach back method used.  Expect fair compliance.  Nutrition-Focused physical exam completed. Findings are no fat depletion, no muscle depletion, and mild edema.    Per MD notes, pt is medically stable for discharge and awaiting most appropriate discharge disposition.   Lab Results  Component Value Date   HGBA1C 4.6 (L) 09/27/2022   PTA DM medications are none.   Labs reviewed: CBGS: 111 (inpatient orders for glycemic control are none).    Obesity is a complex, chronic medical condition that is optimally managed by a multidisciplinary care team. Weight loss is not an ideal goal for an acute inpatient hospitalization. However, if further work-up for obesity is warranted, consider outpatient referral to [facility].    Body mass index is 59.2 kg/m. Patient meets criteria for obesity, class III based on current BMI. Obesity is a complex, chronic medical condition that is optimally managed by a multidisciplinary care team. Weight loss is not an ideal goal for an acute inpatient hospitalization. However, if further work-up for obesity is warranted, consider outpatient referral to [facility] Nutrition and  Diabetes [facility].    Current diet order is renal (liberalized to 2 gram sodium), patient is consuming approximately 100% of meals at this time. Labs and medications reviewed.   No nutrition interventions warranted at this time. If nutrition issues arise, please consult RD.   Levada Schilling, RD, LDN, CDCES Registered Dietitian II Certified Diabetes Care and Education Specialist Please refer to [facility] for RD and/or RD on-call/weekend/after hours pager

## 2022-12-31 NOTE — Progress Notes (Signed)
Occupational Therapy Treatment Patient Details Name: Gary [name] MRN: [id number] DOB: 11-26-1955 Today's Date: 12/31/2022   History of present illness 67 y/o male presented to [facility] ED on 11/28/22 for chest pain x 4 days. Admitted for acute on chronic CHF. Frequent admissions this year with most recent dsicharge on 11/21/22. PMH includes obesity, hypoxia, respiratory failure with tracheostomy, COPD, GIB, HFpEF.   OT comments  Mr Plona was seen for OT treatment on this date. Upon arrival to room pt reclined in bed, agreeable to tx. Pt requires MAX A don B shoes in sitting. SUPERVISION + RW for functional mobility ~100 ft; SpO2 desat 86% on 6L trach collar following mobility. Plan for next session to trial standing from elevated chair height. Pt making progress toward goals, will continue to follow POC. Discharge recommendation remains appropriate.       If plan is discharge home, recommend the following:  A little help with walking and/or transfers;A lot of help with bathing/dressing/bathroom   Equipment Recommendations  None recommended by OT    Recommendations for Other Services      Precautions / Restrictions Precautions Precautions: Fall Precaution Comments: trach collar/ Trach Restrictions Weight Bearing Restrictions: No       Mobility Bed Mobility Overal bed mobility: Needs Assistance Bed Mobility: Supine to Sit     Supine to sit: Min assist          Transfers Overall transfer level: Needs assistance Equipment used: Rolling walker (2 wheels) Transfers: Sit to/from Stand Sit to Stand: Supervision, From elevated surface                 Balance Overall balance assessment: Needs assistance Sitting-balance support: Feet supported, No upper extremity supported Sitting balance-[name] Scale: Good     Standing balance support: Bilateral upper extremity supported, Reliant on assistive device for balance Standing balance-[name] Scale: Fair                              ADL either performed or assessed with clinical judgement   ADL Overall ADL's : Needs assistance/impaired                                       General ADL Comments: MAX A don B shoes in sitting. SUPERVISION + RW for functional mobility      Cognition Arousal: Alert Behavior During Therapy: WFL for tasks assessed/performed, Anxious Overall Cognitive Status: Within Functional Limits for tasks assessed                                                     Pertinent Vitals/ Pain       Pain Assessment Pain Assessment: 0-10 Pain Score: 2  Pain Location: throat / trach site Pain Descriptors / Indicators: Aching Pain Intervention(s): Limited activity within patient's tolerance   Frequency  Min 1X/week        Progress Toward Goals  OT Goals(current goals can now be found in the care plan section)  Progress towards OT goals: Progressing toward goals  Acute Rehab OT Goals Patient Stated Goal: get stronger OT Goal Formulation: With patient Time For Goal Achievement: 01/10/23 Potential to Achieve Goals: Good ADL Goals Pt Will Perform Grooming:  with modified independence;standing Pt Will Transfer to Toilet: with modified independence;ambulating;regular height toilet Pt Will Perform Toileting - Clothing Manipulation and hygiene: with mod assist;sit to/from stand  Plan Discharge plan remains appropriate;Frequency remains appropriate    Co-evaluation                 AM-PAC OT "6 Clicks" Daily Activity     Outcome Measure   Help from another person eating meals?: None Help from another person taking care of personal grooming?: A Little Help from another person toileting, which includes using toliet, bedpan, or urinal?: A Lot Help from another person bathing (including washing, rinsing, drying)?: A Lot Help from another person to put on and taking off regular upper body clothing?: None Help from another person to put  on and taking off regular lower body clothing?: A Lot 6 Click Score: 17    End of Session    OT Visit Diagnosis: Unsteadiness on feet (R26.81);Other abnormalities of gait and mobility (R26.89);Muscle weakness (generalized) (M62.81);Adult, failure to thrive (R62.7)   Activity Tolerance Patient tolerated treatment well   Patient Left in chair;with call bell/phone within reach   Nurse Communication          Time: 5621-3086 OT Time Calculation (min): 40 min  Charges: OT General Charges $OT Visit: 1 Visit OT Treatments $Self Care/Home Management : 8-22 mins $Therapeutic Activity: 23-37 mins  Kathie Dike, M.S. OTR/L  12/31/22, 3:28 PM  ascom [phone]
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
## 2023-01-01 DIAGNOSIS — I5033 Acute on chronic diastolic (congestive) heart failure: Secondary | ICD-10-CM | POA: Diagnosis not present

## 2023-01-01 DIAGNOSIS — J441 Chronic obstructive pulmonary disease with (acute) exacerbation: Secondary | ICD-10-CM | POA: Diagnosis not present

## 2023-01-01 DIAGNOSIS — E8770 Fluid overload, unspecified: Secondary | ICD-10-CM | POA: Diagnosis not present

## 2023-01-01 DIAGNOSIS — J9601 Acute respiratory failure with hypoxia: Secondary | ICD-10-CM | POA: Diagnosis not present

## 2023-01-01 DIAGNOSIS — J9602 Acute respiratory failure with hypercapnia: Secondary | ICD-10-CM | POA: Diagnosis not present

## 2023-01-01 MED ORDER — SULFAMETHOXAZOLE-TRIMETHOPRIM 400-80 MG PO TABS
1.0000 | ORAL_TABLET | ORAL | Status: DC
  Administered 2023-01-02 – 2023-01-11 (×5): 1 via ORAL
  Filled 2023-01-01 (×5): qty 1

## 2023-01-01 NOTE — Progress Notes (Signed)
PT Cancellation Note  Patient Details Name: Gary [name] MRN: [id number] DOB: 07-Sep-1955   Cancelled Treatment:    Reason Eval/Treat Not Completed: Patient declined, no reason specified (Patient declined due to fatigue and not feeling well. He is requesting for PT to come back tomorrow.)  [name], PT, MPT  [name] 01/01/2023, 2:13 PM

## 2023-01-01 NOTE — Plan of Care (Signed)

## 2023-01-01 NOTE — TOC Progression Note (Signed)
Transition of Care [facility]) - Progression Note    Patient Details  Name: ZAYAAN SANGHERA MRN: [id number] Date of Birth: [birth year]  Transition of [facility]) CM/SW Contact  Marlowe Sax, RN Phone Number: 01/01/2023, 11:51 AM  Clinical Narrative:    Jackelyn Knife with [facility] to inquire if they can accept the patient  Left a Secure VM asking for a call   Expected Discharge Plan: Skilled Nursing Facility Barriers to Discharge: Other (must enter comment) (Attempting to find SNF or LTC options as patient stated he cannot care for himself.)  Expected Discharge Plan and Services                                               Social Determinants of Health (SDOH) Interventions SDOH Screenings   Food Insecurity: No Food Insecurity (12/01/2022)  Housing: Patient Declined (12/01/2022)  Transportation Needs: No Transportation Needs (12/01/2022)  Utilities: Not At Risk (12/01/2022)  Depression (PHQ2-9): Low Risk  (06/07/2021)  Financial Resource Strain: High Risk (11/21/2022)   Received from [facility]  Physical Activity: Insufficiently Active (05/03/2017)  Social Connections: Patient Declined (11/20/2022)   Received from [facility]  Stress: Stress Concern Present (11/20/2022)   Received from [facility]  Tobacco Use: Medium Risk (12/01/2022)    Readmission Risk Interventions    11/05/2022    2:59 PM 11/05/2022   12:16 PM 12/31/2021    8:58 AM  Readmission Risk Prevention Plan  Transportation Screening Complete Complete Complete  Medication Review Oceanographer) Complete Complete Complete  PCP or Specialist appointment within 3-5 days of discharge Complete Complete Complete  HRI or Home Care Consult Complete Complete Complete  SW Recovery Care/Counseling Consult Complete Complete Complete  Palliative Care Screening Complete Complete Complete  Skilled Nursing Facility Not Applicable Not Applicable Not Applicable

## 2023-01-01 NOTE — Progress Notes (Signed)
PROGRESS NOTE    Gary [name]  KGM:[id number] DOB: 1955-06-16 DOA: 11/28/2022 PCP: Shayne Alken, MD  [id number]/[id number]-AA  LOS: 32 days   Brief hospital course:   Assessment & Plan: Gary [name] is a 67 y.o. 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   Gary [name] stated that he ran out of his home torsemide several days ago due to a mixup with his pharmacy.  Then over the last couple days, he has noticed increasing lower extremity swelling, shortness of breath and chest pain.  He states that he usually develops chest pain when he has "fluid on his lungs."     Patient was initially diuresed with IV diuresis, responded well.  Significant improvement in peripheral edema.  IV Lasix was switched with home torsemide once creatinine started increasing.   Patient remained stable from respiratory standpoint point and remained on 6 to 8 L of supplemental oxygen via trach collar which is his baseline.   Patient developed Pseudomonas tracheitis, which was treated with Levaquin as he developed some pain around his tracheostomy.   Patient condition has stabilized, pending long-term care placement.     9/11: Started working with PT, hopefully can go home with home health soon.  Still no bed offers.   9/12: Remained hemodynamically stable.  Still does not think that he can go home without a lot of help, it seems unrealistic as no home health can provide that much of help.  TOC still working on placement.  * Acute on chronic diastolic CHF (congestive heart failure) ([name]) Patient is presenting with increased work of breathing, shortness of breath and evidence of hypervolemia on examination.  CXR showed underinflation with bilateral areas of scarring or atelectatic change,  and vascular congestion. Recent echocardiogram in July 2024 with preserved EF at 55-60% and grade 1 diastolic dysfunction. Patient condition improved after initial treatment with IV furosemide, currently on oral torsemide -Continue p.o. torsemide at 40 mg daily and spironolactone   Chronic respiratory failure with hypoxia and hypercapnia ([name]) Is on 6 to 8 L of oxygen over trach collar, stable.  Pseudomonas tracheitis Patient reported pain and tenderness with light manipulation of tracheostomy.  He is chronically colonized but reported unusual symptoms. Patient has completed Levaquin. -ENT consult -no further recommendations  --pulm following --due to blood-tinged tracheostomy secretion, pt was placed on prophylactic bactrim three times per week for next few months while he is recovering, per pulm.   Severe COPD (chronic obstructive pulmonary disease) ([name]) Severe COPD on triple bronchodilator.  Current shortness of breath most likely secondary to heart failure, rather than COPD though. --home Pulmicort neb d/c'ed by pulm.  Chronic home prednisone 10 mg daily was reduced to 5 mg daily by pulm.     Chronic kidney disease, stage 3a ([name]) --Seems stable --intermittent monitor while on diuretics    Hypotension --no need for midodrine currently   OSA (obstructive sleep apnea) --cont home vent for night-time use   Chronic colitis - Continue home mesalamine   Morbid obesity with BMI of 50.0-59.9, adult [facility]) Patient has a history of morbid obesity with a BMI of 52, complicating his current medical care. --dietician requested to discuss weight loss again with caloric restriction.   Chest pain, ACS ruled out Noncardiac in nature.   Anxiety Continue  hydroxyzine as needed.    DVT prophylaxis: GN:FAOZHYQ Code Status: Full code  Family Communication:  Level of care: Med-Surg Dispo:   The patient is from: home Anticipated d/c is to: to be determined Anticipated d/c date is:  whenever there is safe disposition   Subjective and Interval History:  Pt continued to complain of blood with his tracheostomy.     Objective: Vitals:   12/31/22 1532 12/31/22 2343 01/01/23 0850 01/01/23 1508  BP: 111/65 122/79 116/67 118/82  Pulse: 85 84 84 83  Resp: 20 (!) 22 14 20   Temp: (!) 97.4 F (36.3 C) 98.4 F (36.9 C) 98.5 F (36.9 C) 97.9 F (36.6 C)  TempSrc:      SpO2: 91% 95% 91% 95%  Weight:      Height:        Intake/Output Summary (Last 24 hours) at 01/01/2023 2008 Last data filed at 01/01/2023 1850 Gross per 24 hour  Intake 840 ml  Output 2500 ml  Net -1660 ml   Filed Weights   12/03/22 0457 12/09/22 0500 12/24/22 0600  Weight: (!) 192.1 kg (!) 195 kg (!) 220.6 kg    Examination:   Constitutional: NAD, AAOx3 HEENT: conjunctivae and lids normal, EOMI CV: No cyanosis.   RESP: on trach Neuro: II - XII grossly intact.   Psych: flat mood and affect.     Data Reviewed: I have personally reviewed labs and imaging studies  Time spent: 35 minutes  Darlin Priestly, MD [facility] If 7PM-7AM, please contact night-coverage 01/01/2023, 8:08 PM
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
## 2023-01-02 DIAGNOSIS — E8770 Fluid overload, unspecified: Secondary | ICD-10-CM | POA: Diagnosis not present

## 2023-01-02 DIAGNOSIS — J441 Chronic obstructive pulmonary disease with (acute) exacerbation: Secondary | ICD-10-CM | POA: Diagnosis not present

## 2023-01-02 DIAGNOSIS — J9602 Acute respiratory failure with hypercapnia: Secondary | ICD-10-CM | POA: Diagnosis not present

## 2023-01-02 DIAGNOSIS — J9601 Acute respiratory failure with hypoxia: Secondary | ICD-10-CM | POA: Diagnosis not present

## 2023-01-02 DIAGNOSIS — I5033 Acute on chronic diastolic (congestive) heart failure: Secondary | ICD-10-CM | POA: Diagnosis not present

## 2023-01-02 LAB — BASIC METABOLIC PANEL WITH GFR
Anion gap: 7 (ref 5–15)
BUN: 30 mg/dL — ABNORMAL HIGH (ref 8–23)
CO2: 30 mmol/L (ref 22–32)
Calcium: 8.1 mg/dL — ABNORMAL LOW (ref 8.9–10.3)
Chloride: 100 mmol/L (ref 98–111)
Creatinine, Ser: 1.17 mg/dL (ref 0.61–1.24)
GFR, Estimated: >60 mL/min (ref >60)
Glucose, Bld: 116 mg/dL — ABNORMAL HIGH (ref 70–99)
Potassium: 4.2 mmol/L (ref 3.5–5.1)
Sodium: 137 mmol/L (ref 135–145)

## 2023-01-02 LAB — CBC WITH DIFFERENTIAL/PLATELET
Abs Immature Granulocytes: 0.02 10*3/uL (ref 0.00–0.07)
Basophils Absolute: 0 10*3/uL (ref 0.0–0.1)
Basophils Relative: 1 %
Eosinophils Absolute: 0.1 10*3/uL (ref 0.0–0.5)
Eosinophils Relative: 2 %
HCT: 38 % — ABNORMAL LOW (ref 39.0–52.0)
Hemoglobin: 10 g/dL — ABNORMAL LOW (ref 13.0–17.0)
Immature Granulocytes: 0 %
Lymphocytes Relative: 21 %
Lymphs Abs: 1.2 10*3/uL (ref 0.7–4.0)
MCH: 20.5 pg — ABNORMAL LOW (ref 26.0–34.0)
MCHC: 26.3 g/dL — ABNORMAL LOW (ref 30.0–36.0)
MCV: 77.9 fL — ABNORMAL LOW (ref 80.0–100.0)
Monocytes Absolute: 0.6 10*3/uL (ref 0.1–1.0)
Monocytes Relative: 11 %
Neutro Abs: 3.7 10*3/uL (ref 1.7–7.7)
Neutrophils Relative %: 65 %
Platelets: 193 10*3/uL (ref 150–400)
RBC: 4.88 MIL/uL (ref 4.22–5.81)
RDW: 18.5 % — ABNORMAL HIGH (ref 11.5–15.5)
WBC: 5.6 10*3/uL (ref 4.0–10.5)
nRBC: 0.5 % — ABNORMAL HIGH (ref 0.0–0.2)

## 2023-01-02 MED ORDER — ENOXAPARIN SODIUM 120 MG/0.8ML IJ SOSY
110.0000 mg | PREFILLED_SYRINGE | INTRAMUSCULAR | Status: DC
  Administered 2023-01-02 – 2023-01-11 (×7): 110 mg via SUBCUTANEOUS
  Filled 2023-01-02 (×10): qty 0.74

## 2023-01-02 NOTE — Plan of Care (Signed)

## 2023-01-02 NOTE — Plan of Care (Signed)
Problem: Clinical Measurements: Goal: Ability to maintain clinical measurements within normal limits will improve Outcome: Progressing Goal: Will remain free from infection Outcome: Progressing Goal: Diagnostic test results will improve Outcome: Progressing Goal: Respiratory complications will improve Outcome: Progressing Goal: Cardiovascular complication will be avoided Outcome: Progressing   Problem: Elimination: Goal: Will not experience complications related to bowel motility Outcome: Progressing Goal: Will not experience complications related to urinary retention Outcome: Progressing   Problem: Safety: Goal: Ability to remain free from injury will improve Outcome: Progressing   Problem: Skin Integrity: Goal: Risk for impaired skin integrity will decrease Outcome: Progressing

## 2023-01-02 NOTE — Plan of Care (Signed)

## 2023-01-02 NOTE — Consult Note (Signed)
PHARMACIST - PHYSICIAN COMMUNICATION  CONCERNING:  Enoxaparin (Lovenox) for DVT Prophylaxis    RECOMMENDATION: Patient was prescribed enoxaprin 40mg  q24 hours for VTE prophylaxis.   Filed Weights   12/03/22 0457 12/09/22 0500 12/24/22 0600  Weight: (!) 192.1 kg (423 lb 8.1 oz) (!) 195 kg (429 lb 14.4 oz) (!) 220.6 kg (486 lb 5.4 oz)    Body mass index is 59.2 kg/m.  Estimated Creatinine Clearance: 124.9 mL/min (by C-G formula based on SCr of 1.17 mg/dL).   Based on [facility] policy patient is candidate for enoxaparin 0.5mg /kg TBW SQ every 24 hours based on BMI being >30.  DESCRIPTION: Pharmacy has adjusted enoxaparin dose per [facility] policy.  Patient is now receiving enoxaparin 110 mg every 24 hours   Celene Squibb, PharmD Clinical Pharmacist 01/02/2023 1:39 PM

## 2023-01-02 NOTE — Progress Notes (Signed)
Physical Therapy Treatment Patient Details Name: Gary [name] MRN: [id number] DOB: Mar 10, 1956 Today's Date: 01/02/2023   History of Present Illness 67 y/o male presented to [facility] ED on 11/28/22 for chest pain x 4 days. Admitted for acute on chronic CHF. Frequent admissions this year with most recent dsicharge on 11/21/22. PMH includes obesity, hypoxia, respiratory failure with tracheostomy, COPD, GIB, HFpEF.    PT Comments  PT/OT co-treat. Pt remains on trach collar throughout session. He likes to dictate session progression and continues to be self limiting overall." I'm just weak today." Pt was educated by Thereasa Parkin and MD prior to session that he needs to increase activity if he wants to improve and return home. Pt is unwilling to attempt standing from lower surface heights due to fear/anxiety. Will continue to encourage pt to work on this in future sessions. He did however ambulate ~ 40 ft with RW and stood 1 x from recliner with mats (3) to elevate surface height. He will benefit form continued skilled PT to maximize independence and safety while decreasing caregiver burden.   If plan is discharge home, recommend the following: A little help with walking and/or transfers;A lot of help with bathing/dressing/bathroom;Assist for transportation     Equipment Recommendations  Other (comment) (pt states he had all equipment needs met at home.)       Precautions / Restrictions Precautions Precautions: Fall Precaution Comments: trach collar/ Trach Restrictions Weight Bearing Restrictions: No RLE Weight Bearing: Weight bearing as tolerated     Mobility  Bed Mobility Overal bed mobility: Needs Assistance Bed Mobility: Supine to Sit Rolling: Min assist Supine to sit: Min assist Sit to supine: Mod assist     Transfers Overall transfer level: Needs assistance Equipment used: Rolling walker (2 wheels) Transfers: Sit to/from Stand Sit to Stand: Contact guard assist  General transfer comment:  pt stood 1 x from elevated bed height and 1 x from recliner with 3 cushions in seat to elevated reclner height. had pt stand to a lower RW height to allow better leverage from arms.    Ambulation/Gait Ambulation/Gait assistance: Supervision Gait Distance (Feet): 40 Feet Assistive device: Rolling walker (2 wheels) (BRW) Gait Pattern/deviations: Step-through pattern, Wide base of support Gait velocity: decreased  General Gait Details: pt ambulated ~ 40 ft with BRW + chair follow and trach collar    Balance Overall balance assessment: Needs assistance Sitting-balance support: Feet supported, No upper extremity supported Sitting balance-[name] Scale: Good     Standing balance support: Bilateral upper extremity supported, Reliant on assistive device for balance Standing balance-[name] Scale: Fair Standing balance comment: Reliant on RW/AD for all standing activity.    Cognition Arousal: Alert Behavior During Therapy: WFL for tasks assessed/performed, Anxious Overall Cognitive Status: Within Functional Limits for tasks assessed         General Comments General comments (skin integrity, edema, etc.): lengthy discussion about need to increase exercises and activity daily      Pertinent Vitals/Pain Pain Assessment Pain Assessment: No/denies pain Pain Score: 0-No pain Pain Location: throat / trach site Pain Descriptors / Indicators: Aching Pain Intervention(s): Limited activity within patient's tolerance, Monitored during session, Premedicated before session, Repositioned     PT Goals (current goals can now be found in the care plan section) Acute Rehab PT Goals Patient Stated Goal: to get stronger Progress towards PT goals: Progressing toward goals    Frequency    Min 1X/week      PT Plan Current plan remains appropriate  Co-evaluation   Reason for Co-Treatment: Complexity of the patient's impairments (multi-system involvement);Necessary to address cognition/behavior  during functional activity;For patient/therapist safety;To address functional/ADL transfers PT goals addressed during session: Mobility/safety with mobility;Balance;Proper use of DME;Strengthening/ROM OT goals addressed during session: ADL's and self-care      AM-PAC PT "6 Clicks" Mobility   Outcome Measure  Help needed turning from your back to your side while in a flat bed without using bedrails?: None Help needed moving from lying on your back to sitting on the side of a flat bed without using bedrails?: A Little Help needed moving to and from a bed to a chair (including a wheelchair)?: A Little Help needed standing up from a chair using your arms (e.g., wheelchair or bedside chair)?: A Little Help needed to walk in hospital room?: A Little Help needed climbing 3-5 steps with a railing? : Total 6 Click Score: 17    End of Session Equipment Utilized During Treatment: Oxygen (trach collar throughout session) Activity Tolerance: Patient tolerated treatment well;Patient limited by fatigue;Other (comment) (remains self limiting) Patient left: in bed;with call bell/phone within reach;with bed alarm set Nurse Communication: Mobility status PT Visit Diagnosis: Muscle weakness (generalized) (M62.81);Difficulty in walking, not elsewhere classified (R26.2)     Time: 6269-4854 PT Time Calculation (min) (ACUTE ONLY): 38 min  Charges:    $Gait Training: 8-22 mins $Therapeutic Activity: 8-22 mins PT General Charges $$ ACUTE PT VISIT: 1 Visit                    Jetta Lout PTA 01/02/23, 5:22 PM

## 2023-01-02 NOTE — TOC Progression Note (Signed)
Transition of Care [facility]) - Progression Note    Patient Details  Name: MAHENDRA PRUITTE MRN: [id number] Date of Birth: February 15, 1956  Transition of Care [facility]) CM/SW Contact  Marlowe Sax, RN Phone Number: 01/02/2023, 1:36 PM  Clinical Narrative:    NO bed offers at this time [facility] again asking if they can accept they have declined   Expected Discharge Plan: Skilled Nursing Facility Barriers to Discharge: Other (must enter comment) (Attempting to find SNF or LTC options as patient stated he cannot care for himself.)  Expected Discharge Plan and Services                                               Social Determinants of Health (SDOH) Interventions SDOH Screenings   Food Insecurity: No Food Insecurity (12/01/2022)  Housing: Patient Declined (12/01/2022)  Transportation Needs: No Transportation Needs (12/01/2022)  Utilities: Not At Risk (12/01/2022)  Depression (PHQ2-9): Low Risk  (06/07/2021)  Financial Resource Strain: High Risk (11/21/2022)   Received from [facility]  Physical Activity: Insufficiently Active (05/03/2017)  Social Connections: Patient Declined (11/20/2022)   Received from [facility]  Stress: Stress Concern Present (11/20/2022)   Received from [facility]  Tobacco Use: Medium Risk (12/01/2022)    Readmission Risk Interventions    11/05/2022    2:59 PM 11/05/2022   12:16 PM 12/31/2021    8:58 AM  Readmission Risk Prevention Plan  Transportation Screening Complete Complete Complete  Medication Review Oceanographer) Complete Complete Complete  PCP or Specialist appointment within 3-5 days of discharge Complete Complete Complete  HRI or Home Care Consult Complete Complete Complete  SW Recovery Care/Counseling Consult Complete Complete Complete  Palliative Care Screening Complete Complete Complete  Skilled Nursing Facility Not Applicable Not Applicable Not Applicable

## 2023-01-02 NOTE — Progress Notes (Signed)
Occupational Therapy Treatment Patient Details Name: Gary [name] MRN: [id number] DOB: 1955-04-17 Today's Date: 01/02/2023   History of present illness 67 y/o male presented to [facility] ED on 11/28/22 for chest pain x 4 days. Admitted for acute on chronic CHF. Frequent admissions this year with most recent dsicharge on 11/21/22. PMH includes obesity, hypoxia, respiratory failure with tracheostomy, COPD, GIB, HFpEF.   OT comments  Gary [name] was seen for OT/PT co-treatment on this date. Upon arrival to room pt reclined in bed, agreeable to tx. Pt requires MIN A exit bed. CGA + RW sit<>stand from max elevated bed height and from elevated chair height. Agreeable to trial standing from lower height next session. Pt making progress toward goals, will continue to follow POC. Discharge recommendation remains appropriate.       If plan is discharge home, recommend the following:  A little help with walking and/or transfers;A lot of help with bathing/dressing/bathroom   Equipment Recommendations  None recommended by OT    Recommendations for Other Services      Precautions / Restrictions Precautions Precautions: Fall Precaution Comments: trach collar/ Trach Restrictions Weight Bearing Restrictions: No RLE Weight Bearing: Weight bearing as tolerated       Mobility Bed Mobility Overal bed mobility: Needs Assistance Bed Mobility: Supine to Sit, Sit to Supine     Supine to sit: Min assist Sit to supine: Mod assist   General bed mobility comments: assist for BLE    Transfers Overall transfer level: Needs assistance Equipment used: Rolling walker (2 wheels) Transfers: Sit to/from Stand Sit to Stand: Contact guard assist           General transfer comment: x2 trials once from elevated bed once from elevated chair     Balance Overall balance assessment: Needs assistance Sitting-balance support: Feet supported, No upper extremity supported Sitting balance-[name] Scale: Good      Standing balance support: Bilateral upper extremity supported, Reliant on assistive device for balance Standing balance-[name] Scale: Fair                             ADL either performed or assessed with clinical judgement   ADL Overall ADL's : Needs assistance/impaired                                       General ADL Comments: MAX A don B shoes in sitting. SUPERVISION + RW for functional mobility      Cognition Arousal: Alert Behavior During Therapy: WFL for tasks assessed/performed, Anxious Overall Cognitive Status: Within Functional Limits for tasks assessed                                                     Pertinent Vitals/ Pain       Pain Assessment Pain Assessment: No/denies pain   Frequency  Min 1X/week        Progress Toward Goals  OT Goals(current goals can now be found in the care plan section)  Progress towards OT goals: Progressing toward goals  Acute Rehab OT Goals Patient Stated Goal: to walk OT Goal Formulation: With patient Time For Goal Achievement: 01/10/23 Potential to Achieve Goals: Good ADL Goals Pt Will Perform Grooming: with  modified independence;standing Pt Will Transfer to Toilet: with modified independence;ambulating;regular height toilet Pt Will Perform Toileting - Clothing Manipulation and hygiene: with mod assist;sit to/from stand  Plan Discharge plan remains appropriate;Frequency remains appropriate    Co-evaluation    PT/OT/SLP Co-Evaluation/Treatment: Yes Reason for Co-Treatment: Complexity of the patient's impairments (multi-system involvement);Necessary to address cognition/behavior during functional activity;For patient/therapist safety;To address functional/ADL transfers PT goals addressed during session: Mobility/safety with mobility;Balance;Proper use of DME;Strengthening/ROM OT goals addressed during session: ADL's and self-care      AM-PAC OT "6 Clicks" Daily  Activity     Outcome Measure   Help from another person eating meals?: None Help from another person taking care of personal grooming?: A Little Help from another person toileting, which includes using toliet, bedpan, or urinal?: A Lot Help from another person bathing (including washing, rinsing, drying)?: A Lot Help from another person to put on and taking off regular upper body clothing?: None Help from another person to put on and taking off regular lower body clothing?: A Lot 6 Click Score: 17    End of Session Equipment Utilized During Treatment: Oxygen;Rolling walker (2 wheels)  OT Visit Diagnosis: Unsteadiness on feet (R26.81);Other abnormalities of gait and mobility (R26.89);Muscle weakness (generalized) (M62.81);Adult, failure to thrive (R62.7)   Activity Tolerance Patient tolerated treatment well   Patient Left in bed;with call bell/phone within reach   Nurse Communication          Time: 4098-1191 OT Time Calculation (min): 36 min  Charges: OT General Charges $OT Visit: 1 Visit OT Treatments $Self Care/Home Management : 8-22 mins  Kathie Dike, M.S. OTR/L  01/02/23, 2:43 PM  ascom [phone]

## 2023-01-02 NOTE — Progress Notes (Signed)
PROGRESS NOTE    ELPIDIO HEINDL  XLK:[id number] DOB: 04-04-1956 DOA: 11/28/2022 PCP: Shayne Alken, MD  [id number]/[id number]-[id number]  LOS: 33 days   Brief hospital course:   Assessment & Plan: Gary [name] is a 67 y.o. 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   Mr. Gater stated that he ran out of his home torsemide several days ago due to a mixup with his pharmacy.  Then over the last couple days, he has noticed increasing lower extremity swelling, shortness of breath and chest pain.  He states that he usually develops chest pain when he has "fluid on his lungs."     Patient was initially diuresed with IV diuresis, responded well.  Significant improvement in peripheral edema.  IV Lasix was switched with home torsemide once creatinine started increasing.   Patient remained stable from respiratory standpoint point and remained on 6 to 8 L of supplemental oxygen via trach collar which is his baseline.   Patient developed Pseudomonas tracheitis, which was treated with Levaquin as he developed some pain around his tracheostomy.   Patient condition has stabilized, pending long-term care placement.     9/11: Started working with PT, hopefully can go home with home health soon.  Still no bed offers.   9/12: Remained hemodynamically stable.  Still does not think that he can go home without a lot of help, it seems unrealistic as no home health can provide that much of help.  TOC still working on placement.  * Acute on chronic diastolic CHF (congestive heart failure) ([name]) Patient is presenting with increased work of breathing, shortness of breath and evidence of hypervolemia on examination.  CXR showed underinflation with bilateral areas of scarring or atelectatic change,  and vascular congestion. Recent echocardiogram in July 2024 with preserved EF at 55-60% and grade 1 diastolic dysfunction. Patient condition improved after initial treatment with IV furosemide, currently on oral torsemide -Continue p.o. torsemide at 40 mg daily and spironolactone --fluid restriction to 1500 ml per day   Chronic respiratory failure with hypoxia and hypercapnia ([name]) Is on 6 to 8 L of oxygen over trach collar, stable.  Pseudomonas tracheitis Patient reported pain and tenderness with light manipulation of tracheostomy.  He is chronically colonized but reported unusual symptoms. Patient has completed Levaquin. -ENT consult -no further recommendations  --pulm following --due to blood-tinged tracheostomy secretion, pt was placed on prophylactic bactrim three times per week for next few months while he is recovering, per pulm.   Severe COPD (chronic obstructive pulmonary disease) ([name]) Severe COPD on triple bronchodilator.  Current shortness of breath most likely secondary to heart failure, rather than COPD though. --home Pulmicort neb d/c'ed by pulm.  Chronic home prednisone 10 mg daily was reduced to 5 mg daily by pulm.     Chronic kidney disease, stage 3a ([name]) --Seems stable --intermittent monitor while on diuretics    Hypotension --no need for midodrine currently   OSA (obstructive sleep apnea) --cont home vent for night-time use   Chronic colitis - Continue home mesalamine   Morbid obesity with BMI of 50.0-59.9, adult [facility]) Patient has a history of morbid obesity with a BMI of 52, complicating his current medical care. --dietician requested to discuss weight loss again with caloric restriction. --change from 2g sodium diet to heart  healthy diet   Chest pain, ACS ruled out Noncardiac in nature.   Anxiety Continue hydroxyzine as needed.    DVT prophylaxis: [id number] Code Status: Full code  Family Communication:  Level of care: Med-Surg Dispo:   The  patient is from: home Anticipated d/c is to: to be determined Anticipated d/c date is: whenever there is safe disposition   Subjective and Interval History:  Pt complained about his low-sodium diet.  Changed it to heart healthy with fluid restriction.   Objective: Vitals:   01/01/23 1508 01/01/23 2043 01/02/23 0008 01/02/23 0807  BP: 118/82  126/80 110/78  Pulse: 83  (!) 59 78  Resp: 20  18 18   Temp: 97.9 F (36.6 C)  98.2 F (36.8 C) 98.4 F (36.9 C)  TempSrc:      SpO2: 95% 94% (!) 75% 100%  Weight:      Height:        Intake/Output Summary (Last 24 hours) at 01/02/2023 1924 Last data filed at 01/02/2023 1726 Gross per 24 hour  Intake 480 ml  Output 1450 ml  Net -970 ml   Filed Weights   12/03/22 0457 12/09/22 0500 12/24/22 0600  Weight: (!) 192.1 kg (!) 195 kg (!) 220.6 kg    Examination:   Constitutional: NAD, AAOx3 HEENT: conjunctivae and lids normal, EOMI CV: No cyanosis.   RESP: trach present Neuro: II - XII grossly intact.   Psych: flat mood and affect.      Data Reviewed: I have personally reviewed labs and imaging studies  Time spent: 35 minutes  Darlin Priestly, MD [facility] If 7PM-7AM, please contact night-coverage 01/02/2023, 7:24 PM
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
## 2023-01-02 NOTE — Progress Notes (Signed)
Placed patient on home ventilator for the night.

## 2023-01-02 NOTE — Progress Notes (Signed)
Patient continues to have ongoing bleeding from trach. Changing inner cannula numerously throughout shift. RN states patient to start antibiotics today. Patient requested trach to be changed at this point. I advised against such due to bleeding and also asked patient to discuss concerns with physician today. Continues home vent use at night and trach collar use during the day.

## 2023-01-03 DIAGNOSIS — J441 Chronic obstructive pulmonary disease with (acute) exacerbation: Secondary | ICD-10-CM | POA: Diagnosis not present

## 2023-01-03 DIAGNOSIS — J9602 Acute respiratory failure with hypercapnia: Secondary | ICD-10-CM | POA: Diagnosis not present

## 2023-01-03 DIAGNOSIS — E8770 Fluid overload, unspecified: Secondary | ICD-10-CM | POA: Diagnosis not present

## 2023-01-03 DIAGNOSIS — J9601 Acute respiratory failure with hypoxia: Secondary | ICD-10-CM | POA: Diagnosis not present

## 2023-01-03 DIAGNOSIS — I5033 Acute on chronic diastolic (congestive) heart failure: Secondary | ICD-10-CM | POA: Diagnosis not present

## 2023-01-03 MED ORDER — HYDROXYZINE HCL 25 MG PO TABS
25.0000 mg | ORAL_TABLET | Freq: Four times a day (QID) | ORAL | Status: DC | PRN
  Administered 2023-01-03 – 2023-01-11 (×15): 25 mg via ORAL
  Filled 2023-01-03 (×16): qty 1
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
## 2023-01-03 NOTE — TOC Progression Note (Addendum)
Transition of Care [facility]) - Progression Note    Patient Details  Name: Gary [name] MRN: [id number] Date of Birth: 05/13/1955  Transition [facility]) CM/SW Contact  Marlowe Sax, RN Phone Number: 01/03/2023, 2:37 PM  Clinical Narrative:    Sent out to [facility], [place], [place] as well as many other facilities looking for a long term care bed for the patient Called the [facility] in [place] [phone] and left a VM asking for a call back  Expected Discharge Plan: Skilled Nursing Facility Barriers to Discharge: Other (must enter comment) (Attempting to find SNF or LTC options as patient stated he cannot care for himself.)  Expected Discharge Plan and Services                                               Social Determinants of Health (SDOH) Interventions SDOH Screenings   Food Insecurity: No Food Insecurity (12/01/2022)  Housing: Patient Declined (12/01/2022)  Transportation Needs: No Transportation Needs (12/01/2022)  Utilities: Not At Risk (12/01/2022)  Depression (PHQ2-9): Low Risk  (06/07/2021)  Financial Resource Strain: High Risk (11/21/2022)   Received from [facility]  Physical Activity: Insufficiently Active (05/03/2017)  Social Connections: Patient Declined (11/20/2022)   Received from [facility]  Stress: Stress Concern Present (11/20/2022)   Received from [facility]  Tobacco Use: Medium Risk (12/01/2022)    Readmission Risk Interventions    11/05/2022    2:59 PM 11/05/2022   12:16 PM 12/31/2021    8:58 AM  Readmission Risk Prevention Plan  Transportation Screening Complete Complete Complete  Medication Review Oceanographer) Complete Complete Complete  PCP or Specialist appointment within 3-5 days of discharge Complete Complete Complete  HRI or Home Care Consult Complete Complete Complete  SW Recovery Care/Counseling Consult Complete Complete Complete  Palliative Care Screening Complete Complete Complete  Skilled Nursing  Facility Not Applicable Not Applicable Not Applicable

## 2023-01-03 NOTE — Progress Notes (Signed)
Mobility Specialist - Progress Note   01/03/23 1605  Mobility  Activity Transferred from chair to bed  Level of Assistance +2 (takes two people)  Assistive Device  Water quality scientist lift)  Activity Response Tolerated well  $Mobility charge 1 Mobility  Mobility Specialist Start Time (ACUTE ONLY) 1533  Mobility Specialist Stop Time (ACUTE ONLY) 1600  Mobility Specialist Time Calculation (min) (ACUTE ONLY) 27 min   Pt sitting in the recliner, requesting transfer to bed. Pt transferred to bed via hoyer lift +2 for safety. Pt left in fowler position with needs within reach.   Zetta Bills Mobility Specialist 01/03/23 4:07 PM

## 2023-01-03 NOTE — Progress Notes (Signed)
Patient refused vital signs at this time

## 2023-01-03 NOTE — Progress Notes (Addendum)
Physical Therapy Treatment Patient Details Name: PRECILIANO DELACY MRN: [id number] DOB: 09-Oct-1955 Today's Date: 01/03/2023   History of Present Illness 67 y/o male presented to [facility] ED on 11/28/22 for chest pain x 4 days. Admitted for acute on chronic CHF. Frequent admissions this year with most recent dsicharge on 11/21/22. PMH includes obesity, hypoxia, respiratory failure with tracheostomy, COPD, GIB, HFpEF.    PT Comments  Pt willing to walk with therapy today, but unwilling to attempt desired task requested by therapist.He like to dictate session progression and requires increased time for all task. He was able to amb ~50 feet with BRW (pt demands mobile trach collar O2 be increase from 6L to 8L " I need the extra o2 while I walk.") CGA, and chair follow before needing to sit down due to his shoulder bothering him. Encouraged pt to trial standing from recliner after session but denied. PT provided continuing education for pt on need  to increase activity and ambulatory frequency, pt agrees but but was unable to progress today compared to prior sessions.Goals remain appropriate, plan of care extended to reflect patients current progress. Pt will benefit from continued PT services upon discharge to safely address deficits listed in patient problem list for decreased caregiver assistance and eventual return to PLOF.    If plan is discharge home, recommend the following: A little help with walking and/or transfers;A lot of help with bathing/dressing/bathroom;Assist for transportation   Can travel by private vehicle     No  Equipment Recommendations  Other (comment) (pt states he had all equipment needs met at home.)    Recommendations for Other Services       Precautions / Restrictions Precautions Precautions: Fall Precaution Comments: trach collar/ Trach Restrictions Weight Bearing Restrictions: No     Mobility  Bed Mobility Overal bed mobility: Needs Assistance Bed Mobility: Supine to  Sit     Supine to sit: Min assist     General bed mobility comments: assist for BLE    Transfers Overall transfer level: Needs assistance Equipment used: Rolling walker (2 wheels) Transfers: Sit to/from Stand Sit to Stand: Contact guard assist, From elevated surface           General transfer comment: pt stood from elevated bed surface, CGA; pt cued for forward lean to assit with STS    Ambulation/Gait Ambulation/Gait assistance: Supervision Gait Distance (Feet): 50 Feet Assistive device: Rolling walker (2 wheels) (BRW) Gait Pattern/deviations: Step-through pattern, Wide base of support Gait velocity: decreased     General Gait Details: pt amb ~50 ft with BRW + chair follow, pt cued to try to walk further, pt needing to sit due to his shoulder bothering him.   Stairs             Wheelchair Mobility     Tilt Bed    Modified Rankin (Stroke Patients Only)       Balance Overall balance assessment: Needs assistance Sitting-balance support: Feet supported, No upper extremity supported Sitting balance-[name] Scale: Good Sitting balance - Comments: sitting EOB   Standing balance support: Bilateral upper extremity supported, Reliant on assistive device for balance Standing balance-[name] Scale: Fair Standing balance comment: Reliant on RW/AD for all standing activity.                            Cognition Arousal: Alert Behavior During Therapy: WFL for tasks assessed/performed, Anxious Overall Cognitive Status: Within Functional Limits for tasks assessed  Exercises      General Comments        Pertinent Vitals/Pain Pain Assessment Pain Assessment: Faces Faces Pain Scale: Hurts a little bit Pain Location: throat / trach site Pain Descriptors / Indicators: Aching Pain Intervention(s): Monitored during session, Limited activity within patient's tolerance, Repositioned    Home  Living                          Prior Function            PT Goals (current goals can now be found in the care plan section) Progress towards PT goals: Goals updated    Frequency    Min 1X/week      PT Plan Current plan remains appropriate    Co-evaluation              AM-PAC PT "6 Clicks" Mobility   Outcome Measure  Help needed turning from your back to your side while in a flat bed without using bedrails?: None Help needed moving from lying on your back to sitting on the side of a flat bed without using bedrails?: A Little Help needed moving to and from a bed to a chair (including a wheelchair)?: A Little Help needed standing up from a chair using your arms (e.g., wheelchair or bedside chair)?: A Little Help needed to walk in hospital room?: A Little Help needed climbing 3-5 steps with a railing? : Total 6 Click Score: 17    End of Session Equipment Utilized During Treatment: Oxygen (trach collar throughout session) Activity Tolerance: Patient tolerated treatment well;Patient limited by fatigue;Other (comment) (self limiting) Patient left: in chair;with call bell/phone within reach Nurse Communication: Mobility status PT Visit Diagnosis: Muscle weakness (generalized) (M62.81);Difficulty in walking, not elsewhere classified (R26.2)     Time: 5573-2202 PT Time Calculation (min) (ACUTE ONLY): 19 min  Charges:                            Cecile Sheerer, SPT 01/03/23, 2:43 PM

## 2023-01-03 NOTE — Progress Notes (Addendum)
PROGRESS NOTE    Gary [name]  ZOX:[id number] DOB: [birth year] DOA: 11/28/2022 PCP: Shayne Alken, MD  158A/158A-AA  LOS: 34 days   Brief hospital course:   Assessment & Plan: Gary [name] is a 67 y.o. 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   Gary [name] stated that he ran out of his home torsemide several days ago due to a mixup with his pharmacy.  Then over the last couple days, he has noticed increasing lower extremity swelling, shortness of breath and chest pain.  He states that he usually develops chest pain when he has "fluid on his lungs."     Patient was initially diuresed with IV diuresis, responded well.  Significant improvement in peripheral edema.  IV Lasix was switched with home torsemide once creatinine started increasing.   Patient remained stable from respiratory standpoint point and remained on 6 to 8 L of supplemental oxygen via trach collar which is his baseline.   Patient developed Pseudomonas tracheitis, which was treated with Levaquin as he developed some pain around his tracheostomy.   Patient condition has stabilized, pending long-term care placement.     9/11: Started working with PT, hopefully can go home with home health soon.  Still no bed offers.   9/12: Remained hemodynamically stable.  Still does not think that he can go home without a lot of help, it seems unrealistic as no home health can provide that much of help.  TOC still working on placement.  * Acute on chronic diastolic CHF (congestive heart failure) ([name]) Patient is presenting with increased work of breathing, shortness of breath and evidence of hypervolemia on examination.  CXR showed underinflation with bilateral areas of scarring or atelectatic change,  and vascular congestion. Recent echocardiogram in July 2024 with preserved EF at 55-60% and grade 1 diastolic dysfunction. Patient condition improved after initial treatment with IV furosemide, currently on oral torsemide -Continue p.o. torsemide at 40 mg daily and spironolactone --fluid restriction to 1500 ml per day   Chronic respiratory failure with hypoxia and hypercapnia ([name]) Is on 6 to 8 L of oxygen over trach collar, stable.  Pseudomonas tracheitis Patient reported pain and tenderness with light manipulation of tracheostomy.  He is chronically colonized but reported unusual symptoms. Patient has completed Levaquin. -ENT consult -no further recommendations  --pulm following --due to blood-tinged tracheostomy secretion, pt was placed on prophylactic bactrim three times per week for next few months while he is recovering, per pulm. --bleeding from tracheostomy improved after home Eliquis 2.5 mg Bid was d/c'ed on 01/02/23.  Pharm was not able to find a clear indication for pt being on Eliquis.   Severe COPD (chronic obstructive pulmonary disease) ([name]) Severe COPD on triple bronchodilator.  Current shortness of breath most likely secondary to heart failure, rather than COPD though. --home Pulmicort neb d/c'ed by pulm.  Chronic home prednisone 10 mg daily was reduced to 5 mg daily by pulm, and then d/c'ed on 01/01/23.   Chronic kidney disease, stage 3a ([name]) --Seems stable --intermittent monitor Cr while on diuretics    Hypotension --no need for midodrine currently   OSA (obstructive sleep apnea) --cont home vent for night-time use   Chronic colitis - Continue home mesalamine   Morbid obesity with BMI of 50.0-59.9, adult [facility]) Patient  has a history of morbid obesity with a BMI of 52, complicating his current medical care. --dietician requested to discuss weight loss again with caloric restriction. --changed from 2g sodium diet to heart healthy diet which pt was more willing to  accept   Chest pain, ACS ruled out Noncardiac in nature.   Anxiety Continue hydroxyzine as needed.    DVT prophylaxis: [id number] Code Status: Full code  Family Communication:  Level of care: Med-Surg Dispo:   The patient is from: home Anticipated d/c is to: to be determined Anticipated d/c date is: whenever there is safe disposition. Pt set a goal to return home on 01/15/23.   Subjective and Interval History:  Pt reported bleeding from tracheostomy reduced after Eliquis d/c'ed.  Pt said he was satisfied with the change from low-sodium to heart healthy diet.  Pt said he walked today and he aimed to go home 1st of the month.   Objective: Vitals:   01/02/23 0807 01/02/23 2000 01/03/23 0803 01/03/23 1600  BP: 110/78  121/70 116/79  Pulse: 78  80 92  Resp: 18  20 19   Temp: 98.4 F (36.9 C)  98.2 F (36.8 C) 98.3 F (36.8 C)  TempSrc:   Oral   SpO2: 100% 92% 96% 95%  Weight:      Height:        Intake/Output Summary (Last 24 hours) at 01/03/2023 2006 Last data filed at 01/03/2023 1321 Gross per 24 hour  Intake 480 ml  Output 1600 ml  Net -1120 ml   Filed Weights   12/03/22 0457 12/09/22 0500 12/24/22 0600  Weight: (!) 192.1 kg (!) 195 kg (!) 220.6 kg    Examination:   Constitutional: NAD, AAOx3 HEENT: conjunctivae and lids normal, EOMI CV: No cyanosis.   RESP: trach present Extremities: some swelling in feet SKIN: warm, dry   Data Reviewed: I have personally reviewed labs and imaging studies  Time spent: 35 minutes  Darlin Priestly, MD [facility] If 7PM-7AM, please contact night-coverage 01/03/2023, 8:06 PM

## 2023-01-04 DIAGNOSIS — I959 Hypotension, unspecified: Secondary | ICD-10-CM | POA: Diagnosis not present

## 2023-01-04 DIAGNOSIS — J9612 Chronic respiratory failure with hypercapnia: Secondary | ICD-10-CM | POA: Diagnosis not present

## 2023-01-04 DIAGNOSIS — E8770 Fluid overload, unspecified: Secondary | ICD-10-CM | POA: Diagnosis not present

## 2023-01-04 DIAGNOSIS — J9601 Acute respiratory failure with hypoxia: Secondary | ICD-10-CM | POA: Diagnosis not present

## 2023-01-04 DIAGNOSIS — J9611 Chronic respiratory failure with hypoxia: Secondary | ICD-10-CM | POA: Diagnosis not present

## 2023-01-04 DIAGNOSIS — N1831 Chronic kidney disease, stage 3a: Secondary | ICD-10-CM | POA: Diagnosis not present

## 2023-01-04 DIAGNOSIS — J441 Chronic obstructive pulmonary disease with (acute) exacerbation: Secondary | ICD-10-CM | POA: Diagnosis not present

## 2023-01-04 DIAGNOSIS — R079 Chest pain, unspecified: Secondary | ICD-10-CM | POA: Diagnosis not present

## 2023-01-04 DIAGNOSIS — J9602 Acute respiratory failure with hypercapnia: Secondary | ICD-10-CM | POA: Diagnosis not present

## 2023-01-04 DIAGNOSIS — I5033 Acute on chronic diastolic (congestive) heart failure: Secondary | ICD-10-CM | POA: Diagnosis not present

## 2023-01-04 DIAGNOSIS — Z6841 Body Mass Index (BMI) 40.0 and over, adult: Secondary | ICD-10-CM | POA: Diagnosis not present

## 2023-01-04 NOTE — Progress Notes (Signed)
Physical Therapy Treatment Patient Details Name: Gary [name] MRN: [id number] DOB: 1955/10/01 Today's Date: 01/04/2023   History of Present Illness 67 y/o male presented to [facility] ED on 11/28/22 for chest pain x 4 days. Admitted for acute on chronic CHF. Frequent admissions this year with most recent dsicharge on 11/21/22. PMH includes obesity, hypoxia, respiratory failure with tracheostomy, COPD, GIB, HFpEF.    PT Comments  Pt was long sitting in bed on 10 L o2 trach collar. He agrees to session but continues to like to dictate session progression. Pt unwilling to attempt standing from lower surface heights but did get out of bed, stand to BRW, and tolerate ambulation ~ 100 ft. Sao2 dropped to 85% on 10 L trach collar but quickly recovered with seated rest to >90% HR peak elevation at 116 bpm during ambulation. Pt is fatigued overall with some SOB noted. Author continues to encourage pt to work with whoever comes to assist him staff wise. He was educated on need to improve strength and activity tolerance if he homes to return home from acute hospital.     If plan is discharge home, recommend the following: A little help with walking and/or transfers;A lot of help with bathing/dressing/bathroom;Assist for transportation     Equipment Recommendations  Other (comment) (Pt endorses having all equipment needs met)       Precautions / Restrictions Precautions Precautions: Fall Precaution Comments: trach collar/ Trach Restrictions Weight Bearing Restrictions: No     Mobility  Bed Mobility Overal bed mobility: Needs Assistance Bed Mobility: Supine to Sit Rolling: Supervision, Used rails Supine to sit: Supervision Sit to supine: Mod assist General bed mobility comments: pt is able to exit L sid eof bed without physical assistance however heavy use of bed rail + use of bed functions to achieve. Unwilling to stand from acceptable bed height. Mod assist to progress BLEs into bed from EOB short  sitting    Transfers Overall transfer level: Needs assistance Equipment used: Rolling walker (2 wheels) Transfers: Sit to/from Stand Sit to Stand: Supervision  General transfer comment: Pt was able to stand without assistance however remains from extremely high elevated bed height. pt 's anxiety and weakness make him unwilling to attempt from a lower surface height    Ambulation/Gait Ambulation/Gait assistance: Supervision Gait Distance (Feet): 100 Feet Assistive device: Rolling walker (2 wheels) (BRW + 10 L trach collar) Gait Pattern/deviations: Step-through pattern Gait velocity: decreased  General Gait Details: Pt did ambulated 2 x as far as previous date but needed two standing rest. sao2 dropped to 85% but quickly recovers to > 90% with seated rest. Pt was on 10 KL trach collar throughout session    Balance Overall balance assessment: Needs assistance Sitting-balance support: Feet supported, No upper extremity supported Sitting balance-[name] Scale: Good     Standing balance support: Bilateral upper extremity supported, Reliant on assistive device for balance Standing balance-[name] Scale: Fair Standing balance comment: Reliant on RW/AD for all standing activity.     Cognition Arousal: Alert Behavior During Therapy: WFL for tasks assessed/performed, Anxious Overall Cognitive Status: Within Functional Limits for tasks assessed    General Comments: Pt remains A and O but unwilling to perform desired task. " I just want to walk."           General Comments General comments (skin integrity, edema, etc.): pt is encouraged to increased activity and participate with whoever (staff) comes to work with him. Pt states he is hopeful to be able to be  home by the first of the month. Encouraged him to talk to family to see about additional supervision assistance for when he is cleared for DC.      Pertinent Vitals/Pain Pain Assessment Pain Assessment: 0-10 Pain Score: 2  Pain  Location:  (R arm/shoulder pain with wt bearing) Pain Descriptors / Indicators: Aching Pain Intervention(s): Limited activity within patient's tolerance, Monitored during session     PT Goals (current goals can now be found in the care plan section) Acute Rehab PT Goals Patient Stated Goal: get stronger so I can go home by the 1st of the month Progress towards PT goals: Progressing toward goals    Frequency    Min 1X/week      PT Plan Current plan remains appropriate    Co-evaluation     PT goals addressed during session: Mobility/safety with mobility;Proper use of DME;Strengthening/ROM        AM-PAC PT "6 Clicks" Mobility   Outcome Measure  Help needed turning from your back to your side while in a flat bed without using bedrails?: None Help needed moving from lying on your back to sitting on the side of a flat bed without using bedrails?: A Little Help needed moving to and from a bed to a chair (including a wheelchair)?: A Little Help needed standing up from a chair using your arms (e.g., wheelchair or bedside chair)?: A Lot Help needed to walk in hospital room?: A Little Help needed climbing 3-5 steps with a railing? : Total 6 Click Score: 16    End of Session Equipment Utilized During Treatment: Oxygen Activity Tolerance: Patient tolerated treatment well;Patient limited by fatigue;Other (comment) (self limiting) Patient left: in bed;with call bell/phone within reach;with nursing/sitter in room Nurse Communication: Mobility status PT Visit Diagnosis: Muscle weakness (generalized) (M62.81);Difficulty in walking, not elsewhere classified (R26.2)     Time: 6962-9528 PT Time Calculation (min) (ACUTE ONLY): 25 min  Charges:    $Gait Training: 8-22 mins PT General Charges $$ ACUTE PT VISIT: 1 Visit                    Jetta Lout PTA 01/04/23, 12:34 PM

## 2023-01-04 NOTE — TOC Progression Note (Signed)
Transition of Care [facility]) - Progression Note    Patient Details  Name: BAINE BRESTER MRN: [id number] Date of Birth: 05-28-1955  Transition of [facility]) CM/SW Contact  Harriet Masson, RN Phone Number: 01/04/2023, 10:00 AM  Clinical Narrative:     Sherron Monday to Marylene Land at [facility] who states they patient is over their weight limit. Marylene Land referred this RNCM to call [facility] and [facility].  Spoke to Russell with [facility] nursing who states their weight limit is 350 lbs. Left VM for karen at [facility].  TOC following.   Expected Discharge Plan: Skilled Nursing Facility Barriers to Discharge: Other (must enter comment) (Attempting to find SNF or LTC options as patient stated he cannot care for himself.)  Expected Discharge Plan and Services                                               Social Determinants of Health (SDOH) Interventions SDOH Screenings   Food Insecurity: No Food Insecurity (12/01/2022)  Housing: Patient Declined (12/01/2022)  Transportation Needs: No Transportation Needs (12/01/2022)  Utilities: Not At Risk (12/01/2022)  Depression (PHQ2-9): Low Risk  (06/07/2021)  Financial Resource Strain: High Risk (11/21/2022)   Received from [facility]  Physical Activity: Insufficiently Active (05/03/2017)  Social Connections: Patient Declined (11/20/2022)   Received from [facility]  Stress: Stress Concern Present (11/20/2022)   Received from [facility]  Tobacco Use: Medium Risk (12/01/2022)    Readmission Risk Interventions    11/05/2022    2:59 PM 11/05/2022   12:16 PM 12/31/2021    8:58 AM  Readmission Risk Prevention Plan  Transportation Screening Complete Complete Complete  Medication Review [employer]) Complete Complete Complete  PCP or Specialist appointment within 3-5 days of discharge Complete Complete Complete  HRI or Home Care Consult Complete Complete Complete  SW Recovery Care/Counseling Consult Complete Complete Complete   Palliative Care Screening Complete Complete Complete  Skilled Nursing Facility Not Applicable Not Applicable Not Applicable
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
## 2023-01-04 NOTE — TOC Progression Note (Signed)
Transition of Care [facility]) - CM/SW Discharge Note   Patient Details  Name: Gary [name] MRN: [id number] Date of Birth: [birth year]  Transition of Care [facility] CM/SW Contact:  Marlowe Sax, RN Phone Number: 01/04/2023, 11:44 AM   Clinical Narrative:     [name] with [facility] is going to call the patient to ask him about his Medicaid, they do have a Bariatric bed available and possibly will be able to offer a bed    Barriers to Discharge: Other (must enter comment) (Attempting to find SNF or LTC options as patient stated he cannot care for himself.)   Patient Goals and CMS Choice      Discharge Placement                         Discharge Plan and Services Additional resources added to the After Visit Summary for                                       Social Determinants of Health (SDOH) Interventions SDOH Screenings   Food Insecurity: No Food Insecurity (12/01/2022)  Housing: Patient Declined (12/01/2022)  Transportation Needs: No Transportation Needs (12/01/2022)  Utilities: Not At Risk (12/01/2022)  Depression (PHQ2-9): Low Risk  (06/07/2021)  Financial Resource Strain: High Risk (11/21/2022)   Received from [facility]  Physical Activity: Insufficiently Active (05/03/2017)  Social Connections: Patient Declined (11/20/2022)   Received from [facility]  Stress: Stress Concern Present (11/20/2022)   Received from [facility]  Tobacco Use: Medium Risk (12/01/2022)     Readmission Risk Interventions    11/05/2022    2:59 PM 11/05/2022   12:16 PM 12/31/2021    8:58 AM  Readmission Risk Prevention Plan  Transportation Screening Complete Complete Complete  Medication Review Oceanographer) Complete Complete Complete  PCP or Specialist appointment within 3-5 days of discharge Complete Complete Complete  HRI or Home Care Consult Complete Complete Complete  SW Recovery Care/Counseling Consult Complete Complete Complete  Palliative Care Screening  Complete Complete Complete  Skilled Nursing Facility Not Applicable Not Applicable Not Applicable

## 2023-01-04 NOTE — Progress Notes (Signed)
OT Cancellation Note  Patient Details Name: Gary [name] MRN: [id number] DOB: October 14, 1955   Cancelled Treatment:    Reason Eval/Treat Not Completed: Other (comment) (Pt refused due to being tired post PT session) Pt agreed to participate in OT session the next available date.  Glenard Haring, OTS

## 2023-01-04 NOTE — Plan of Care (Signed)
Problem: Clinical Measurements: Goal: Will remain free from infection Outcome: Progressing   Problem: Nutrition: Goal: Adequate nutrition will be maintained Outcome: Progressing   Problem: Elimination: Goal: Will not experience complications related to bowel motility Outcome: Progressing Goal: Will not experience complications related to urinary retention Outcome: Progressing   Problem: Pain Managment: Goal: General experience of comfort will improve Outcome: Progressing   Problem: Safety: Goal: Ability to remain free from injury will improve Outcome: Progressing

## 2023-01-04 NOTE — Progress Notes (Signed)
PROGRESS NOTE    Gary [name]  VWU:[id number] DOB: Nov 21, 1955 DOA: 11/28/2022 PCP: Shayne Alken, MD  [id number]-AA  LOS: 35 days   Brief hospital course: Gary [name] is a 67 y.o. 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   Gary [name] stated that he ran out of his home torsemide several days ago due to a mixup with his pharmacy.  Then over the last couple days, he has noticed increasing lower extremity swelling, shortness of breath and chest pain.  He states that he usually develops chest pain when he has "fluid on his lungs."     Patient was initially diuresed with IV diuresis, responded well.  Significant improvement in peripheral edema.  IV Lasix was switched with home torsemide once creatinine started increasing.   Patient remained stable from respiratory standpoint point and remained on 6 to 8 L of supplemental oxygen via trach collar which is his baseline.   Patient developed Pseudomonas tracheitis, which was treated with Levaquin as he developed some pain around his tracheostomy.   Patient condition has stabilized, pending long-term care placement.     9/11: Started working with PT, hopefully can go home with home health soon.  Still no bed offers.   9/12: Remained hemodynamically stable.  Still does not think that he can go home without a lot of help, it seems unrealistic as no home health can provide that much of help.  TOC still working on placement.  Assessment & Plan:  * Acute on chronic diastolic CHF (congestive heart failure) ([name]) Patient is presenting with increased work of breathing, shortness of breath and evidence of hypervolemia on examination.  CXR showed underinflation with bilateral areas of scarring or atelectatic change,  and vascular congestion. Recent echocardiogram in July 2024 with preserved EF at 55-60% and grade 1 diastolic dysfunction. Patient condition improved after initial treatment with IV furosemide, currently on oral torsemide -Continue p.o. torsemide at 40 mg daily and spironolactone --fluid restriction to 1500 ml per day   Chronic respiratory failure with hypoxia and hypercapnia ([name]) Is on 6 to 8 L of oxygen over trach collar, stable.  Pseudomonas tracheitis Patient reported pain and tenderness with light manipulation of tracheostomy.  He is chronically colonized but reported unusual symptoms. Patient has completed Levaquin. -ENT consult -no further recommendations  --pulm following --due to blood-tinged tracheostomy secretion, pt was placed on prophylactic bactrim three times per week for next few months while he is recovering, per pulm. --bleeding from tracheostomy improved after home Eliquis 2.5 mg Bid was d/c'ed on 01/02/23.  Pharm was not able to find a clear indication for pt being on Eliquis.   Severe COPD (chronic obstructive pulmonary disease) ([name]) Severe COPD on triple bronchodilator.  Current shortness of breath most likely secondary to heart failure, rather than COPD though. --home Pulmicort neb d/c'ed by pulm.  Chronic home prednisone 10 mg daily was reduced to 5 mg daily by pulm, and then d/c'ed on 01/01/23.   Chronic kidney disease, stage 3a ([name]) --Seems stable --intermittent monitor Cr while on diuretics    Hypotension --no need for midodrine currently   OSA (obstructive sleep apnea) --cont home vent for night-time use   Chronic colitis - Continue home mesalamine   Morbid obesity with BMI of 50.0-59.9, adult [facility]) Patient has  a history of morbid obesity with a BMI of 52, complicating his current medical care. --dietician requested to discuss weight loss again with caloric restriction. --changed from 2g sodium diet to heart healthy diet which pt was more willing to  accept   Chest pain, ACS ruled out Noncardiac in nature.   Anxiety Continue hydroxyzine as needed.    DVT prophylaxis: [id number] Code Status: Full code  Family Communication:  Level of care: Med-Surg Dispo:   The patient is from: home Anticipated d/c is to: Likely need long-term care Anticipated d/c date is: whenever there is safe disposition. Pt set a goal to return home on 01/15/23.   Subjective and Interval History:  Patient was seen and examined today.  No new concern.  Per patient he still cannot take care of himself but wants to go home once all the home health his established   Objective: Vitals:   01/03/23 1600 01/03/23 2046 01/04/23 0022 01/04/23 0902  BP: 116/79  (!) 139/93 119/73  Pulse: 92 89 83 86  Resp: 19  20 20   Temp: 98.3 F (36.8 C)  97.7 F (36.5 C) 97.8 F (36.6 C)  TempSrc:    Oral  SpO2: 95% 95% 95% 98%  Weight:      Height:        Intake/Output Summary (Last 24 hours) at 01/04/2023 1650 Last data filed at 01/04/2023 1500 Gross per 24 hour  Intake 360 ml  Output 2450 ml  Net -2090 ml   Filed Weights   12/03/22 0457 12/09/22 0500 12/24/22 0600  Weight: (!) 192.1 kg (!) 195 kg (!) 220.6 kg    Examination:   General.  Morbidly obese gentleman, in no acute distress. Pulmonary.  Lungs clear bilaterally, normal respiratory effort. CV.  Regular rate and rhythm, no JVD, rub or murmur. Abdomen.  Soft, nontender, nondistended, BS positive. CNS.  Alert and oriented .  No focal neurologic deficit. Extremities.  Trace LE edema, no cyanosis, pulses intact and symmetrical. Psychiatry.  Judgment and insight appears normal.    Data Reviewed: I have personally reviewed labs and imaging studies  Time spent: 40 minutes  This record has been created using Conservation officer, historic buildings. Errors have been sought and corrected,but may not always be located. Such creation errors do not reflect on the standard of care.   Arnetha Courser, MD [facility] If 7PM-7AM, please contact night-coverage 01/04/2023, 4:50 PM

## 2023-01-05 DIAGNOSIS — J9612 Chronic respiratory failure with hypercapnia: Secondary | ICD-10-CM | POA: Diagnosis not present

## 2023-01-05 DIAGNOSIS — I5033 Acute on chronic diastolic (congestive) heart failure: Secondary | ICD-10-CM | POA: Diagnosis not present

## 2023-01-05 DIAGNOSIS — I959 Hypotension, unspecified: Secondary | ICD-10-CM | POA: Diagnosis not present

## 2023-01-05 DIAGNOSIS — J9611 Chronic respiratory failure with hypoxia: Secondary | ICD-10-CM | POA: Diagnosis not present

## 2023-01-05 DIAGNOSIS — Z6841 Body Mass Index (BMI) 40.0 and over, adult: Secondary | ICD-10-CM | POA: Diagnosis not present

## 2023-01-05 DIAGNOSIS — R079 Chest pain, unspecified: Secondary | ICD-10-CM | POA: Diagnosis not present

## 2023-01-05 DIAGNOSIS — E8770 Fluid overload, unspecified: Secondary | ICD-10-CM | POA: Diagnosis not present

## 2023-01-05 DIAGNOSIS — N1831 Chronic kidney disease, stage 3a: Secondary | ICD-10-CM | POA: Diagnosis not present

## 2023-01-05 NOTE — Progress Notes (Signed)
Mobility Specialist - Progress Note   01/05/23 1323  Mobility  Activity Refused mobility   Pt defers mobility this PM, will attempt again at another time and date.   [name]  Mobility Specialist  01/05/23 1:23 PM

## 2023-01-05 NOTE — Progress Notes (Signed)
PROGRESS NOTE    Gary ERHARD  [name]:[id number] DOB: 01-13-1956 DOA: 11/28/2022 PCP: Shayne Alken, MD  158A/[id number]-AA  LOS: 36 days   Brief hospital course: Gary [name] is a 67 y.o. 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   Gary [name] stated that he ran out of his home torsemide several days ago due to a mixup with his pharmacy.  Then over the last couple days, he has noticed increasing lower extremity swelling, shortness of breath and chest pain.  He states that he usually develops chest pain when he has "fluid on his lungs."     Patient was initially diuresed with IV diuresis, responded well.  Significant improvement in peripheral edema.  IV Lasix was switched with home torsemide once creatinine started increasing.   Patient remained stable from respiratory standpoint point and remained on 6 to 8 L of supplemental oxygen via trach collar which is his baseline.   Patient developed Pseudomonas tracheitis, which was treated with Levaquin as he developed some pain around his tracheostomy.   Patient condition has stabilized, pending long-term care placement.     9/11: Started working with PT, hopefully can go home with home health soon.  Still no bed offers.   9/12: Remained hemodynamically stable.  Still does not think that he can go home without a lot of help, it seems unrealistic as no home health can provide that much of help.  TOC still working on placement.  Assessment & Plan:  * Acute on chronic diastolic CHF (congestive heart failure) ([name]) Patient is presenting with increased work of breathing, shortness of breath and evidence of hypervolemia on examination.  CXR showed underinflation with bilateral areas of scarring or atelectatic change,  and vascular congestion. Recent echocardiogram in July 2024 with preserved EF at 55-60% and grade 1 diastolic dysfunction. Patient condition improved after initial treatment with IV furosemide, currently on oral torsemide -Continue p.o. torsemide at 40 mg daily and spironolactone --fluid restriction to 1500 ml per day   Chronic respiratory failure with hypoxia and hypercapnia ([name]) Is on 6 to 8 L of oxygen over trach collar, stable.  Pseudomonas tracheitis Patient reported pain and tenderness with light manipulation of tracheostomy.  He is chronically colonized but reported unusual symptoms. Patient has completed Levaquin. -ENT consult -no further recommendations  --pulm following --due to blood-tinged tracheostomy secretion, pt was placed on prophylactic bactrim three times per week for next few months while he is recovering, per pulm. --bleeding from tracheostomy improved after home Eliquis 2.5 mg Bid was d/c'ed on 01/02/23.  Pharm was not able to find a clear indication for pt being on Eliquis.   Severe COPD (chronic obstructive pulmonary disease) ([name]) Severe COPD on triple bronchodilator.  Current shortness of breath most likely secondary to heart failure, rather than COPD though. --home Pulmicort neb d/c'ed by pulm.  Chronic home prednisone 10 mg daily was reduced to 5 mg daily by pulm, and then d/c'ed on 01/01/23.   Chronic kidney disease, stage 3a ([name]) --Seems stable --intermittent monitor Cr while on diuretics    Hypotension --no need for midodrine currently   OSA (obstructive sleep apnea) --cont home vent for night-time use   Chronic colitis - Continue home mesalamine   Morbid obesity with BMI of 50.0-59.9, adult [facility]) Patient has  a history of morbid obesity with a BMI of 52, complicating his current medical care. --dietician requested to discuss weight loss again with caloric restriction. --changed from 2g sodium diet to heart healthy diet which pt was more willing to  accept   Chest pain, ACS ruled out Noncardiac in nature.   Anxiety Continue hydroxyzine as needed.    DVT prophylaxis: [id number] Code Status: Full code  Family Communication:  Level of care: Med-Surg Dispo:   The patient is from: home Anticipated d/c is to: Likely need long-term care Anticipated d/c date is: whenever there is safe disposition. Pt set a goal to return home on 01/15/23.   Subjective and Interval History:  Patient was seen and examined today.  No new concern   Objective: Vitals:   01/04/23 0902 01/04/23 1759 01/05/23 0120 01/05/23 0805  BP: 119/73 109/66 116/69 118/68  Pulse: 86 81 82 87  Resp: 20 19 (!) 22 20  Temp: 97.8 F (36.6 C) 98.4 F (36.9 C) 98.9 F (37.2 C) (!) 97.4 F (36.3 C)  TempSrc: Oral   Oral  SpO2: 98% 95% 90% 93%  Weight:      Height:        Intake/Output Summary (Last 24 hours) at 01/05/2023 1602 Last data filed at 01/05/2023 1300 Gross per 24 hour  Intake 1320 ml  Output 2500 ml  Net -1180 ml   Filed Weights   12/03/22 0457 12/09/22 0500 12/24/22 0600  Weight: (!) 192.1 kg (!) 195 kg (!) 220.6 kg    Examination:   General.  Morbidly obese gentleman, in no acute distress.  Trach in place. Pulmonary.  Lungs clear bilaterally, normal respiratory effort. CV.  Regular rate and rhythm, no JVD, rub or murmur. Abdomen.  Soft, nontender, nondistended, BS positive. CNS.  Alert and oriented .  No focal neurologic deficit. Extremities.  Trace LE edema, no cyanosis, pulses intact and symmetrical. Psychiatry.  Judgment and insight appears normal.    Data Reviewed: I have personally reviewed labs and imaging studies  Time spent: 39 minutes  This record has been created using Conservation officer, historic buildings. Errors have been sought and corrected,but may not always be located. Such creation errors do not reflect on the standard of care.   Arnetha Courser, MD [facility] If 7PM-7AM, please contact night-coverage 01/05/2023,  4:02 PM

## 2023-01-06 DIAGNOSIS — R079 Chest pain, unspecified: Secondary | ICD-10-CM | POA: Diagnosis not present

## 2023-01-06 DIAGNOSIS — J9612 Chronic respiratory failure with hypercapnia: Secondary | ICD-10-CM | POA: Diagnosis not present

## 2023-01-06 DIAGNOSIS — I5033 Acute on chronic diastolic (congestive) heart failure: Secondary | ICD-10-CM | POA: Diagnosis not present

## 2023-01-06 DIAGNOSIS — J9611 Chronic respiratory failure with hypoxia: Secondary | ICD-10-CM | POA: Diagnosis not present

## 2023-01-06 DIAGNOSIS — N1831 Chronic kidney disease, stage 3a: Secondary | ICD-10-CM | POA: Diagnosis not present

## 2023-01-06 DIAGNOSIS — E8770 Fluid overload, unspecified: Secondary | ICD-10-CM | POA: Diagnosis not present

## 2023-01-06 DIAGNOSIS — Z6841 Body Mass Index (BMI) 40.0 and over, adult: Secondary | ICD-10-CM | POA: Diagnosis not present

## 2023-01-06 DIAGNOSIS — I959 Hypotension, unspecified: Secondary | ICD-10-CM | POA: Diagnosis not present

## 2023-01-06 LAB — CBC
HCT: 38.9 % — ABNORMAL LOW (ref 39.0–52.0)
Hemoglobin: 9.9 g/dL — ABNORMAL LOW (ref 13.0–17.0)
MCH: 20.1 pg — ABNORMAL LOW (ref 26.0–34.0)
MCHC: 25.4 g/dL — ABNORMAL LOW (ref 30.0–36.0)
MCV: 78.9 fL — ABNORMAL LOW (ref 80.0–100.0)
Platelets: 205 10*3/uL (ref 150–400)
RBC: 4.93 MIL/uL (ref 4.22–5.81)
RDW: 18.4 % — ABNORMAL HIGH (ref 11.5–15.5)
WBC: 6.3 10*3/uL (ref 4.0–10.5)
nRBC: 0.3 % — ABNORMAL HIGH (ref 0.0–0.2)

## 2023-01-06 LAB — BASIC METABOLIC PANEL
Anion gap: 8 (ref 5–15)
BUN: 30 mg/dL — ABNORMAL HIGH (ref 8–23)
CO2: 30 mmol/L (ref 22–32)
Calcium: 8.4 mg/dL — ABNORMAL LOW (ref 8.9–10.3)
Chloride: 101 mmol/L (ref 98–111)
Creatinine, Ser: 1.27 mg/dL — ABNORMAL HIGH (ref 0.61–1.24)
GFR, Estimated: >60 mL/min (ref >60)
Glucose, Bld: 109 mg/dL — ABNORMAL HIGH (ref 70–99)
Potassium: 4.7 mmol/L (ref 3.5–5.1)
Sodium: 139 mmol/L (ref 135–145)

## 2023-01-06 NOTE — Progress Notes (Signed)
PROGRESS NOTE    Gary [name]  VOZ:[id number] DOB: 04/26/1955 DOA: 11/28/2022 PCP: Shayne Alken, MD  158A/158A-AA  LOS: 37 days   Brief hospital course: Gary [name] is a 67 y.o. 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   Gary [name] stated that he ran out of his home torsemide several days ago due to a mixup with his pharmacy.  Then over the last couple days, he has noticed increasing lower extremity swelling, shortness of breath and chest pain.  He states that he usually develops chest pain when he has "fluid on his lungs."     Patient was initially diuresed with IV diuresis, responded well.  Significant improvement in peripheral edema.  IV Lasix was switched with home torsemide once creatinine started increasing.   Patient remained stable from respiratory standpoint point and remained on 6 to 8 L of supplemental oxygen via trach collar which is his baseline.   Patient developed Pseudomonas tracheitis, which was treated with Levaquin as he developed some pain around his tracheostomy.   Patient condition has stabilized, pending long-term care placement.     9/11: Started working with PT, hopefully can go home with home health soon.  Still no bed offers.   9/12: Remained hemodynamically stable.  Still does not think that he can go home without a lot of help, it seems unrealistic as no home health can provide that much of help.  TOC still working on placement.  9/22: Vitals and labs stable.  Hemoglobin was checked at his request as he was concerned that he might have had some bleeding in stool.  Assessment & Plan:  * Acute on chronic diastolic CHF (congestive heart failure) ([name]) Patient is presenting with increased work of breathing, shortness  of breath and evidence of hypervolemia on examination.  CXR showed underinflation with bilateral areas of scarring or atelectatic change, and vascular congestion. Recent echocardiogram in July 2024 with preserved EF at 55-60% and grade 1 diastolic dysfunction. Patient condition improved after initial treatment with IV furosemide, currently on oral torsemide -Continue p.o. torsemide at 40 mg daily and spironolactone --fluid restriction to 1500 ml per day   Chronic respiratory failure with hypoxia and hypercapnia ([name]) Is on 6 to 8 L of oxygen over trach collar, stable.  Pseudomonas tracheitis Patient reported pain and tenderness with light manipulation of tracheostomy.  He is chronically colonized but reported unusual symptoms. Patient has completed Levaquin. -ENT consult -no further recommendations  --pulm following --due to blood-tinged tracheostomy secretion, pt was placed on prophylactic bactrim three times per week for next few months while he is recovering, per pulm. --bleeding from tracheostomy improved after home Eliquis 2.5 mg Bid was d/c'ed on 01/02/23.  Pharm was not able to find a clear indication for pt being on Eliquis.   Severe COPD (chronic obstructive pulmonary disease) ([name]) Severe COPD on triple bronchodilator.  Current shortness of breath most likely secondary to heart failure, rather than COPD though. --home Pulmicort neb d/c'ed by pulm.  Chronic home prednisone 10 mg daily was reduced to 5 mg daily by pulm, and then d/c'ed on 01/01/23.   Chronic kidney disease, stage 3a ([name]) --Seems stable --intermittent monitor Cr while on diuretics    Hypotension --no need for midodrine currently   OSA (obstructive sleep apnea) --  cont home vent for night-time use   Chronic colitis - Continue home mesalamine   Morbid obesity with BMI of 50.0-59.9, adult [facility]) Patient has a history of morbid obesity with a BMI of 52, complicating his current medical care. --dietician requested  to discuss weight loss again with caloric restriction. --changed from 2g sodium diet to heart healthy diet which pt was more willing to accept   Chest pain, ACS ruled out Noncardiac in nature.   Anxiety Continue hydroxyzine as needed.    DVT prophylaxis: [id number] Code Status: Full code  Family Communication:  Level of care: Med-Surg Dispo:   The patient is from: home Anticipated d/c is to: Likely need long-term care Anticipated d/c date is: whenever there is safe disposition. Pt set a goal to return home on 01/15/23.   Subjective and Interval History:  Patient was seen and examined today.  No new concern.   Objective: Vitals:   01/05/23 0805 01/05/23 1704 01/06/23 0018 01/06/23 0924  BP: 118/68 120/88 113/69 122/77  Pulse: 87 81 81 87  Resp: 20 20 20 19   Temp: (!) 97.4 F (36.3 C) 98 F (36.7 C) 97.8 F (36.6 C) 98.3 F (36.8 C)  TempSrc: Oral Oral    SpO2: 93% 99% 93% 92%  Weight:      Height:        Intake/Output Summary (Last 24 hours) at 01/06/2023 1517 Last data filed at 01/06/2023 1230 Gross per 24 hour  Intake 480 ml  Output 2600 ml  Net -2120 ml   Filed Weights   12/03/22 0457 12/09/22 0500 12/24/22 0600  Weight: (!) 192.1 kg (!) 195 kg (!) 220.6 kg    Examination:   General.  Morbidly obese gentleman with trach collar, in no acute distress. Pulmonary.  Lungs clear bilaterally, normal respiratory effort. CV.  Regular rate and rhythm, no JVD, rub or murmur. Abdomen.  Soft, nontender, nondistended, BS positive. CNS.  Alert and oriented .  No focal neurologic deficit. Extremities.  Trace edema, no cyanosis, pulses intact and symmetrical. Psychiatry.  Judgment and insight appears normal.    Data Reviewed: I have personally reviewed labs and imaging studies  Time spent: 40 minutes  This record has been created using Conservation officer, historic buildings. Errors have been sought and corrected,but may not always be located. Such creation errors do not  reflect on the standard of care.   Arnetha Courser, MD [facility] If 7PM-7AM, please contact night-coverage 01/06/2023, 3:17 PM

## 2023-01-06 NOTE — Progress Notes (Signed)
Mobility Specialist - Progress Note   01/06/23 1357  Mobility  Activity Ambulated with assistance in hallway;Stood at bedside;Dangled on edge of bed  Level of Assistance Standby assist, set-up cues, supervision of patient - no hands on  Assistive Device Front wheel walker  Distance Ambulated (ft) 60 ft  Activity Response Tolerated well  Mobility Referral Yes  $Mobility charge 1 Mobility  Mobility Specialist Start Time (ACUTE ONLY) 0122  Mobility Specialist Stop Time (ACUTE ONLY) 0158  Mobility Specialist Time Calculation (min) (ACUTE ONLY) 36 min   Pt semi-supine in bed on Trach. Pt completes bed mobility indep with extra time. Pt STS and ambulates in hallway SBA + 2 for a chair follow. Pt left in recliner with needs in reach.

## 2023-01-06 NOTE — Plan of Care (Signed)

## 2023-01-07 DIAGNOSIS — N1831 Chronic kidney disease, stage 3a: Secondary | ICD-10-CM | POA: Diagnosis not present

## 2023-01-07 DIAGNOSIS — J9612 Chronic respiratory failure with hypercapnia: Secondary | ICD-10-CM | POA: Diagnosis not present

## 2023-01-07 DIAGNOSIS — E8770 Fluid overload, unspecified: Secondary | ICD-10-CM | POA: Diagnosis not present

## 2023-01-07 DIAGNOSIS — J9611 Chronic respiratory failure with hypoxia: Secondary | ICD-10-CM | POA: Diagnosis not present

## 2023-01-07 DIAGNOSIS — J9601 Acute respiratory failure with hypoxia: Secondary | ICD-10-CM | POA: Diagnosis not present

## 2023-01-07 DIAGNOSIS — J9602 Acute respiratory failure with hypercapnia: Secondary | ICD-10-CM | POA: Diagnosis not present

## 2023-01-07 DIAGNOSIS — Z6841 Body Mass Index (BMI) 40.0 and over, adult: Secondary | ICD-10-CM | POA: Diagnosis not present

## 2023-01-07 DIAGNOSIS — J441 Chronic obstructive pulmonary disease with (acute) exacerbation: Secondary | ICD-10-CM | POA: Diagnosis not present

## 2023-01-07 DIAGNOSIS — I959 Hypotension, unspecified: Secondary | ICD-10-CM | POA: Diagnosis not present

## 2023-01-07 DIAGNOSIS — R079 Chest pain, unspecified: Secondary | ICD-10-CM | POA: Diagnosis not present

## 2023-01-07 DIAGNOSIS — I5033 Acute on chronic diastolic (congestive) heart failure: Secondary | ICD-10-CM | POA: Diagnosis not present

## 2023-01-07 MED ORDER — GERHARDT'S BUTT CREAM
TOPICAL_CREAM | Freq: Every day | CUTANEOUS | Status: DC
  Administered 2023-01-08 – 2023-02-06 (×4): 1 via TOPICAL
  Filled 2023-01-07 (×5): qty 1

## 2023-01-07 NOTE — TOC Progression Note (Signed)
Transition of Care [facility]) - Progression Note    Patient Details  Name: Gary [name] MRN: [id number] Date of Birth: 05-28-1955  Transition of Care [facility]) CM/SW Contact  Marlowe Sax, RN Phone Number: 01/07/2023, 3:12 PM  Clinical Narrative:     Sherron Monday with [facility] and asked if they would be able to accept, the patient, she stated that they do not take a trilogy at all but will check to see if they can do vent to bipap and let me know  Expected Discharge Plan: Skilled Nursing Facility Barriers to Discharge: Other (must enter comment) (Attempting to find SNF or LTC options as patient stated he cannot care for himself.)  Expected Discharge Plan and Services                                               Social Determinants of Health (SDOH) Interventions SDOH Screenings   Food Insecurity: No Food Insecurity (12/01/2022)  Housing: Patient Declined (12/01/2022)  Transportation Needs: No Transportation Needs (12/01/2022)  Utilities: Not At Risk (12/01/2022)  Depression (PHQ2-9): Low Risk  (06/07/2021)  Financial Resource Strain: High Risk (11/21/2022)   Received from [facility]  Physical Activity: Insufficiently Active (05/03/2017)  Social Connections: Patient Declined (11/20/2022)   Received from [facility]  Stress: Stress Concern Present (11/20/2022)   Received from [facility]  Tobacco Use: Medium Risk (12/01/2022)    Readmission Risk Interventions    11/05/2022    2:59 PM 11/05/2022   12:16 PM 12/31/2021    8:58 AM  Readmission Risk Prevention Plan  Transportation Screening Complete Complete Complete  Medication Review Oceanographer) Complete Complete Complete  PCP or Specialist appointment within 3-5 days of discharge Complete Complete Complete  HRI or Home Care Consult Complete Complete Complete  SW Recovery Care/Counseling Consult Complete Complete Complete  Palliative Care Screening Complete Complete Complete  Skilled Nursing Facility Not Applicable  Not Applicable Not Applicable

## 2023-01-07 NOTE — Progress Notes (Signed)
Occupational Therapy Treatment Patient Details Name: Gary [name] MRN: [id number] DOB: December 07, 1955 Today's Date: 01/07/2023   History of present illness 67 y/o male presented to [facility] ED on 11/28/22 for chest pain x 4 days. Admitted for acute on chronic CHF. Frequent admissions this year with most recent dsicharge on 11/21/22. PMH includes obesity, hypoxia, respiratory failure with tracheostomy, COPD, GIB, HFpEF.   OT comments  Gary [name] was seen for OT/PT co-treatment on this date. Upon arrival to room pt reclined in bed, c/o shortness of breath but agreeable to tx. Pt requires no assist for bed mobility. SBA + RW standing from elevated bed height and ambulating ~15 ft. SpO2 mid 80s following mobility attempts. Requesting to self-propel recliner ~100 ft in hallway for exercise. Pt initiated discussion on practicing standing at sink for safety at home next session. Pt making progress toward goals, will continue to follow POC. Discharge recommendation remains appropriate.       If plan is discharge home, recommend the following:  A little help with walking and/or transfers;A lot of help with bathing/dressing/bathroom   Equipment Recommendations  None recommended by OT    Recommendations for Other Services      Precautions / Restrictions Precautions Precautions: Fall Precaution Comments: trach collar/ Trach Restrictions Weight Bearing Restrictions: No       Mobility Bed Mobility Overal bed mobility: Needs Assistance Bed Mobility: Rolling, Supine to Sit Rolling: Modified independent (Device/Increase time)   Supine to sit: Supervision     General bed mobility comments: rail use nad bed controls    Transfers Overall transfer level: Needs assistance Equipment used: Rolling walker (2 wheels) Transfers: Sit to/from Stand Sit to Stand: Supervision, From elevated surface           General transfer comment: stands from max elevated bed height     Balance Overall balance  assessment: Needs assistance Sitting-balance support: Feet supported, No upper extremity supported Sitting balance-[name] Scale: Good     Standing balance support: Bilateral upper extremity supported, Reliant on assistive device for balance Standing balance-[name] Scale: Fair                             ADL either performed or assessed with clinical judgement   ADL Overall ADL's : Needs assistance/impaired                                       General ADL Comments: MAX A don B shoes in sitting      Cognition Arousal: Alert Behavior During Therapy: WFL for tasks assessed/performed, Anxious Overall Cognitive Status: Within Functional Limits for tasks assessed                                 General Comments: states desire to simulate home transfers                   Pertinent Vitals/ Pain       Pain Assessment Pain Assessment: No/denies pain   Frequency  Min 1X/week        Progress Toward Goals  OT Goals(current goals can now be found in the care plan section)  Progress towards OT goals: Progressing toward goals  Acute Rehab OT Goals Patient Stated Goal: get stronger OT Goal Formulation: With patient Time For Goal  Achievement: 01/10/23 Potential to Achieve Goals: Good ADL Goals Pt Will Perform Grooming: with modified independence;standing Pt Will Transfer to Toilet: with modified independence;ambulating;regular height toilet Pt Will Perform Toileting - Clothing Manipulation and hygiene: with mod assist;sit to/from stand  Plan Discharge plan remains appropriate;Frequency remains appropriate    Co-evaluation    PT/OT/SLP Co-Evaluation/Treatment: Yes Reason for Co-Treatment: To address functional/ADL transfers PT goals addressed during session: Mobility/safety with mobility OT goals addressed during session: ADL's and self-care      AM-PAC OT "6 Clicks" Daily Activity     Outcome Measure   Help from another  person eating meals?: None Help from another person taking care of personal grooming?: A Little Help from another person toileting, which includes using toliet, bedpan, or urinal?: A Lot Help from another person bathing (including washing, rinsing, drying)?: A Lot Help from another person to put on and taking off regular upper body clothing?: None Help from another person to put on and taking off regular lower body clothing?: A Lot 6 Click Score: 17    End of Session Equipment Utilized During Treatment: Oxygen;Rolling walker (2 wheels)  OT Visit Diagnosis: Unsteadiness on feet (R26.81);Other abnormalities of gait and mobility (R26.89);Muscle weakness (generalized) (M62.81);Adult, failure to thrive (R62.7)   Activity Tolerance Patient tolerated treatment well   Patient Left in chair;with call bell/phone within reach   Nurse Communication          Time: 0102-7253 OT Time Calculation (min): 38 min  Charges: OT General Charges $OT Visit: 1 Visit OT Treatments $Self Care/Home Management : 8-22 mins $Therapeutic Activity: 8-22 mins  Kathie Dike, M.S. OTR/L  01/07/23, 2:28 PM  ascom [phone]
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
## 2023-01-07 NOTE — Progress Notes (Signed)
Physical Therapy Treatment Patient Details Name: Gary [name] MRN: [id number] DOB: 1955/06/09 Today's Date: 01/07/2023   History of Present Illness 67 y/o male presented to [facility] ED on 11/28/22 for chest pain x 4 days. Admitted for acute on chronic CHF. Frequent admissions this year with most recent dsicharge on 11/21/22. PMH includes obesity, hypoxia, respiratory failure with tracheostomy, COPD, GIB, HFpEF.    PT Comments  Co-tx with OT for therapist/pt safety and to manage equipment/lines. Pt tolerated session well, however very limited gait distance due to pt c/o B LE weakness and fear of "giving out". Pt educated and understands the importance of progressing mobility in order return home once independent function returns. Will continue to progress as able. Awaiting STR placement.    If plan is discharge home, recommend the following: A little help with walking and/or transfers;A lot of help with bathing/dressing/bathroom;Assist for transportation   Can travel by private vehicle     No  Equipment Recommendations  Other (comment) (Pt endorses having all the wquipment he needs)    Recommendations for Other Services       Precautions / Restrictions Precautions Precautions: Fall Precaution Comments: trach collar/ Trach Restrictions Weight Bearing Restrictions: No     Mobility  Bed Mobility Overal bed mobility: Needs Assistance Bed Mobility: Supine to Sit Rolling: Supervision, Used rails   Supine to sit: Supervision     General bed mobility comments: Heavy use of rails to pull self up to sitting EOB    Transfers Overall transfer level: Needs assistance Equipment used: Rolling walker (2 wheels) Transfers: Sit to/from Stand Sit to Stand: Supervision, From elevated surface           General transfer comment: Pt able to stand from very elevated surface on first attempt to RW    Ambulation/Gait Ambulation/Gait assistance: Supervision Gait Distance (Feet):   (10) Assistive device: Rolling walker (2 wheels) Gait Pattern/deviations: Step-through pattern Gait velocity: decreased     General Gait Details: Pt only tolerated ~15 just outside of door due to c/o B LE weakness   Stairs             Wheelchair Mobility     Tilt Bed    Modified Rankin (Stroke Patients Only)       Balance Overall balance assessment: Needs assistance Sitting-balance support: Feet supported, No upper extremity supported Sitting balance-[name] Scale: Good Sitting balance - Comments: sitting EOB   Standing balance support: Bilateral upper extremity supported, Reliant on assistive device for balance Standing balance-[name] Scale: Fair Standing balance comment: Reliant on RW/AD for all standing activity.                            Cognition Arousal: Alert Behavior During Therapy: WFL for tasks assessed/performed, Anxious Overall Cognitive Status: Within Functional Limits for tasks assessed                                 General Comments:  (Pt cooperative during session, requires increased rest time in between tasks)        Exercises Other Exercises Other Exercises: B LE strengthening through propelling recliner backwards x 28ft    General Comments General comments (skin integrity, edema, etc.): Pt is encouraged to work with any staff member and sit up in chair as much as possible.      Pertinent Vitals/Pain Pain Assessment Pain Assessment: No/denies pain  Home Living                          Prior Function            PT Goals (current goals can now be found in the care plan section) Acute Rehab PT Goals Patient Stated Goal: get stronger so I can go home by the 1st of the month    Frequency    Min 1X/week      PT Plan Current plan remains appropriate    Co-evaluation PT/OT/SLP Co-Evaluation/Treatment: Yes Reason for Co-Treatment: Complexity of the patient's impairments (multi-system  involvement);Necessary to address cognition/behavior during functional activity;For patient/therapist safety;To address functional/ADL transfers PT goals addressed during session: Mobility/safety with mobility;Proper use of DME;Strengthening/ROM OT goals addressed during session: ADL's and self-care      AM-PAC PT "6 Clicks" Mobility   Outcome Measure  Help needed turning from your back to your side while in a flat bed without using bedrails?: None Help needed moving from lying on your back to sitting on the side of a flat bed without using bedrails?: A Little Help needed moving to and from a bed to a chair (including a wheelchair)?: A Little Help needed standing up from a chair using your arms (e.g., wheelchair or bedside chair)?: A Lot Help needed to walk in hospital room?: A Little Help needed climbing 3-5 steps with a railing? : Total 6 Click Score: 16    End of Session Equipment Utilized During Treatment: Oxygen Activity Tolerance: Patient tolerated treatment well;Patient limited by fatigue;Other (comment) Patient left: with call bell/phone within reach;in chair;with chair alarm set Nurse Communication: Mobility status PT Visit Diagnosis: Muscle weakness (generalized) (M62.81);Difficulty in walking, not elsewhere classified (R26.2)     Time: 7829-5621 PT Time Calculation (min) (ACUTE ONLY): 29 min  Charges:    $Therapeutic Activity: 8-22 mins PT General Charges $$ ACUTE PT VISIT: 1 Visit                    Zadie Cleverly, PTA  Jannet Askew 01/07/2023, 2:21 PM

## 2023-01-07 NOTE — Progress Notes (Signed)
PROGRESS NOTE    [name]  FAO:[id number] DOB: Aug 02, 1955 DOA: 11/28/2022 PCP: Shayne Alken, MD  [id number]-[id number]  LOS: 38 days   Brief hospital course: Gary [name] is a 67 y.o. 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   Gary [name] stated that he ran out of his home torsemide several days ago due to a mixup with his pharmacy.  Then over the last couple days, he has noticed increasing lower extremity swelling, shortness of breath and chest pain.  He states that he usually develops chest pain when he has "fluid on his lungs."     Patient was initially diuresed with IV diuresis, responded well.  Significant improvement in peripheral edema.  IV Lasix was switched with home torsemide once creatinine started increasing.   Patient remained stable from respiratory standpoint point and remained on 6 to 8 L of supplemental oxygen via trach collar which is his baseline.   Patient developed Pseudomonas tracheitis, which was treated with Levaquin as he developed some pain around his tracheostomy.   Patient condition has stabilized, pending long-term care placement.     9/11: Started working with PT, hopefully can go home with home health soon.  Still no bed offers.   9/12: Remained hemodynamically stable.  Still does not think that he can go home without a lot of help, it seems unrealistic as no home health can provide that much of help.  TOC still working on placement.  9/22: Vitals and labs stable.  Hemoglobin was checked at his request as he was concerned that he might have had some bleeding in stool.  9/23: Remains stable, still awaiting disposition.  Assessment & Plan:  * Acute on chronic diastolic CHF (congestive heart failure) ([name]) Patient is  presenting with increased work of breathing, shortness of breath and evidence of hypervolemia on examination.  CXR showed underinflation with bilateral areas of scarring or atelectatic change, and vascular congestion. Recent echocardiogram in July 2024 with preserved EF at 55-60% and grade 1 diastolic dysfunction. Patient condition improved after initial treatment with IV furosemide, currently on oral torsemide -Continue p.o. torsemide at 40 mg daily and spironolactone --fluid restriction to 1500 ml per day   Chronic respiratory failure with hypoxia and hypercapnia ([name]) Is on 6 to 8 L of oxygen over trach collar, stable.  Pseudomonas tracheitis Patient reported pain and tenderness with light manipulation of tracheostomy.  He is chronically colonized but reported unusual symptoms. Patient has completed Levaquin. -ENT consult -no further recommendations  --pulm following --due to blood-tinged tracheostomy secretion, pt was placed on prophylactic bactrim three times per week for next few months while he is recovering, per pulm. --bleeding from tracheostomy improved after home Eliquis 2.5 mg Bid was d/c'ed on 01/02/23.  Pharm was not able to find a clear indication for pt being on Eliquis.   Severe COPD (chronic obstructive pulmonary disease) ([name]) Severe COPD on triple bronchodilator.  Current shortness of breath most likely secondary to heart failure, rather than COPD though. --home Pulmicort neb d/c'ed by pulm.  Chronic home prednisone 10 mg daily was reduced to 5 mg daily by pulm, and then d/c'ed on 01/01/23.   Chronic kidney disease, stage 3a ([name]) --Seems stable --intermittent monitor Cr while on diuretics    Hypotension --no need for midodrine  currently   OSA (obstructive sleep apnea) --cont home vent for night-time use   Chronic colitis - Continue home mesalamine   Morbid obesity with BMI of 50.0-59.9, adult [facility]) Patient has a history of morbid obesity with a BMI of 52,  complicating his current medical care. --dietician requested to discuss weight loss again with caloric restriction. --changed from 2g sodium diet to heart healthy diet which pt was more willing to accept   Chest pain, ACS ruled out Noncardiac in nature.   Anxiety Continue hydroxyzine as needed.    DVT prophylaxis: JJ:[id number] Code Status: Full code  Family Communication:  Level of care: Med-Surg Dispo:   The patient is from: home Anticipated d/c is to: Likely need long-term care Anticipated d/c date is: whenever there is safe disposition. Pt set a goal to return home on 01/15/23.   Subjective and Interval History:  Patient was resting comfortably when seen today.  No new concern  Objective: Vitals:   01/06/23 0924 01/06/23 1543 01/06/23 2300 01/07/23 0803  BP: 122/77 112/73 106/74 114/73  Pulse: 87 85 86 82  Resp: 19 17 20 18   Temp: 98.3 F (36.8 C) 98.8 F (37.1 C) 97.8 F (36.6 C) 98 F (36.7 C)  TempSrc:  Oral    SpO2: 92% 91% 93% 98%  Weight:      Height:        Intake/Output Summary (Last 24 hours) at 01/07/2023 1551 Last data filed at 01/07/2023 0440 Gross per 24 hour  Intake --  Output 400 ml  Net -400 ml   Filed Weights   12/03/22 0457 12/09/22 0500 12/24/22 0600  Weight: (!) 192.1 kg (!) 195 kg (!) 220.6 kg    Examination:   General.  Morbidly obese gentleman with trach, in no acute distress. Pulmonary.  Lungs clear bilaterally, normal respiratory effort. CV.  Regular rate and rhythm, no JVD, rub or murmur. Abdomen.  Soft, nontender, nondistended, BS positive. CNS.  Alert and oriented .  No focal neurologic deficit. Extremities.  Trace LE edema, no cyanosis, pulses intact and symmetrical. Psychiatry.  Judgment and insight appears normal.    Data Reviewed: I have personally reviewed labs and imaging studies  Time spent: 39 minutes  This record has been created using Conservation officer, historic buildings. Errors have been sought and corrected,but  may not always be located. Such creation errors do not reflect on the standard of care.   Arnetha Courser, MD [facility] If 7PM-7AM, please contact night-coverage 01/07/2023, 3:51 PM

## 2023-01-07 NOTE — Plan of Care (Signed)

## 2023-01-08 DIAGNOSIS — J441 Chronic obstructive pulmonary disease with (acute) exacerbation: Secondary | ICD-10-CM | POA: Diagnosis not present

## 2023-01-08 DIAGNOSIS — I959 Hypotension, unspecified: Secondary | ICD-10-CM | POA: Diagnosis not present

## 2023-01-08 DIAGNOSIS — E8770 Fluid overload, unspecified: Secondary | ICD-10-CM | POA: Diagnosis not present

## 2023-01-08 DIAGNOSIS — J9612 Chronic respiratory failure with hypercapnia: Secondary | ICD-10-CM | POA: Diagnosis not present

## 2023-01-08 DIAGNOSIS — R079 Chest pain, unspecified: Secondary | ICD-10-CM | POA: Diagnosis not present

## 2023-01-08 DIAGNOSIS — J9611 Chronic respiratory failure with hypoxia: Secondary | ICD-10-CM | POA: Diagnosis not present

## 2023-01-08 DIAGNOSIS — J9602 Acute respiratory failure with hypercapnia: Secondary | ICD-10-CM | POA: Diagnosis not present

## 2023-01-08 DIAGNOSIS — Z6841 Body Mass Index (BMI) 40.0 and over, adult: Secondary | ICD-10-CM | POA: Diagnosis not present

## 2023-01-08 DIAGNOSIS — I5033 Acute on chronic diastolic (congestive) heart failure: Secondary | ICD-10-CM | POA: Diagnosis not present

## 2023-01-08 DIAGNOSIS — N1831 Chronic kidney disease, stage 3a: Secondary | ICD-10-CM | POA: Diagnosis not present

## 2023-01-08 DIAGNOSIS — J9601 Acute respiratory failure with hypoxia: Secondary | ICD-10-CM | POA: Diagnosis not present

## 2023-01-08 NOTE — Progress Notes (Signed)
Occupational Therapy Treatment Patient Details Name: Gary [name] MRN: [id number] DOB: May 14, 1955 Today's Date: 01/08/2023   History of present illness 67 y/o male presented to [facility] ED on 11/28/22 for chest pain x 4 days. Admitted for acute on chronic CHF. Frequent admissions this year with most recent dsicharge on 11/21/22. PMH includes obesity, hypoxia, respiratory failure with tracheostomy, COPD, GIB, HFpEF.   OT comments  Gary [name] was seen for OT treatment on this date. Upon arrival to room pt reclined in bed, agreeable to tx. Pt requires MIN A exit bed, MAX A don B shoes in sitting. CGA + RW for ADL t/f, tolerates ~60 ft. Pt making good progress toward goals, will continue to follow POC. Discharge recommendation remains appropriate.        If plan is discharge home, recommend the following:  A little help with walking and/or transfers;A lot of help with bathing/dressing/bathroom   Equipment Recommendations  None recommended by OT    Recommendations for Other Services      Precautions / Restrictions Precautions Precautions: Fall Precaution Comments: trach collar/ Trach Restrictions Weight Bearing Restrictions: No       Mobility Bed Mobility Overal bed mobility: Needs Assistance Bed Mobility: Supine to Sit     Supine to sit: Min assist          Transfers Overall transfer level: Needs assistance Equipment used: Rolling walker (2 wheels) Transfers: Sit to/from Stand Sit to Stand: Supervision, From elevated surface                 Balance Overall balance assessment: Needs assistance Sitting-balance support: Feet supported, No upper extremity supported Sitting balance-[name] Scale: Good     Standing balance support: Bilateral upper extremity supported, Reliant on assistive device for balance Standing balance-[name] Scale: Fair                             ADL either performed or assessed with clinical judgement   ADL Overall ADL's : Needs  assistance/impaired                                       General ADL Comments: MAX A don B shoes in sitting. CGA + RW simulated toilet t/f      Cognition Arousal: Alert Behavior During Therapy: WFL for tasks assessed/performed, Anxious Overall Cognitive Status: Within Functional Limits for tasks assessed                                                     Pertinent Vitals/ Pain       Pain Assessment Pain Assessment: No/denies pain   Frequency  Min 1X/week        Progress Toward Goals  OT Goals(current goals can now be found in the care plan section)  Progress towards OT goals: Progressing toward goals  Acute Rehab OT Goals Patient Stated Goal: get stronger OT Goal Formulation: With patient Time For Goal Achievement: 01/10/23 Potential to Achieve Goals: Good ADL Goals Pt Will Perform Grooming: with modified independence;standing Pt Will Transfer to Toilet: with modified independence;ambulating;regular height toilet Pt Will Perform Toileting - Clothing Manipulation and hygiene: with mod assist;sit to/from stand  Plan Discharge plan remains appropriate;Frequency remains  appropriate    Co-evaluation    PT/OT/SLP Co-Evaluation/Treatment: Yes Reason for Co-Treatment: To address functional/ADL transfers PT goals addressed during session: Mobility/safety with mobility OT goals addressed during session: ADL's and self-care      AM-PAC OT "6 Clicks" Daily Activity     Outcome Measure   Help from another person eating meals?: None Help from another person taking care of personal grooming?: A Little Help from another person toileting, which includes using toliet, bedpan, or urinal?: A Lot Help from another person bathing (including washing, rinsing, drying)?: A Lot Help from another person to put on and taking off regular upper body clothing?: None Help from another person to put on and taking off regular lower body clothing?: A  Lot 6 Click Score: 17    End of Session    OT Visit Diagnosis: Unsteadiness on feet (R26.81);Other abnormalities of gait and mobility (R26.89);Muscle weakness (generalized) (M62.81);Adult, failure to thrive (R62.7)   Activity Tolerance Patient tolerated treatment well   Patient Left in chair;with call bell/phone within reach   Nurse Communication Mobility status        Time: 8119-1478 OT Time Calculation (min): 35 min  Charges: OT General Charges $OT Visit: 1 Visit OT Treatments $Self Care/Home Management : 8-22 mins  Kathie Dike, M.S. OTR/L  01/08/23, 3:31 PM  ascom [phone]
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
## 2023-01-08 NOTE — Plan of Care (Signed)
  Problem: Education: Goal: Knowledge of General Education information will improve Description Including pain rating scale, medication(s)/side effects and non-pharmacologic comfort measures Outcome: Progressing   

## 2023-01-08 NOTE — Progress Notes (Signed)
Physical Therapy Treatment Patient Details Name: Gary [name] MRN: [id number] DOB: 10/18/1955 Today's Date: 01/08/2023   History of Present Illness 67 y/o male presented to [facility] ED on 11/28/22 for chest pain x 4 days. Admitted for acute on chronic CHF. Frequent admissions this year with most recent dsicharge on 11/21/22. PMH includes obesity, hypoxia, respiratory failure with tracheostomy, COPD, GIB, HFpEF.    PT Comments  Treatment initiated in room prior to OT co-tx beginning. Pt 96% with PMV at rest and humidifying O2 at 10L. Pt required heavy reliance on side rail to transfer supine to sit with HOB raised, Sit to stand from very high bed to RW with Supervision. Increased gait distance to 31ft this date with RW, Supervision, following closely with chair. SpO2 down briefly to 89% upon sitting. Backward propelling with B LE's x 39ft, pt left to finish session with OT. Overall good tolerance for mobility. Pt requires multiple prolonged rest breaks due to fatigue and prep for next task.    If plan is discharge home, recommend the following: A little help with walking and/or transfers;A lot of help with bathing/dressing/bathroom;Assist for transportation   Can travel by private vehicle     No  Equipment Recommendations  Other (comment)    Recommendations for Other Services       Precautions / Restrictions Precautions Precautions: Fall Precaution Comments: trach collar/ Trach Restrictions Weight Bearing Restrictions: No     Mobility  Bed Mobility Overal bed mobility: Needs Assistance Bed Mobility: Rolling, Supine to Sit Rolling: Modified independent (Device/Increase time)   Supine to sit: Supervision Sit to supine: Mod assist   General bed mobility comments: rail use nad bed controls    Transfers Overall transfer level: Needs assistance Equipment used: Rolling walker (2 wheels) Transfers: Sit to/from Stand Sit to Stand: Supervision, From elevated surface            General transfer comment: stands from max elevated bed height    Ambulation/Gait Ambulation/Gait assistance: Supervision Gait Distance (Feet): 50 Feet Assistive device: Rolling walker (2 wheels) Gait Pattern/deviations: Step-through pattern Gait velocity: decreased     General Gait Details: Increased gait distance, 89% upon exertion   Stairs             Wheelchair Mobility     Tilt Bed    Modified Rankin (Stroke Patients Only)       Balance Overall balance assessment: Needs assistance Sitting-balance support: Feet supported, No upper extremity supported Sitting balance-[name] Scale: Good Sitting balance - Comments: sitting EOB   Standing balance support: Bilateral upper extremity supported, Reliant on assistive device for balance Standing balance-[name] Scale: Fair Standing balance comment: Reliant on RW/AD for all standing activity.                            Cognition Arousal: Alert Behavior During Therapy: WFL for tasks assessed/performed, Anxious Overall Cognitive Status: Within Functional Limits for tasks assessed                                          Exercises Other Exercises Other Exercises: B LE strengthening through propelling recliner backwards x 46ft    General Comments General comments (skin integrity, edema, etc.): Discussed POC and possible Dispo to SNF once medically cleared      Pertinent Vitals/Pain Pain Assessment Pain Assessment: No/denies pain  Home Living                          Prior Function            PT Goals (current goals can now be found in the care plan section) Acute Rehab PT Goals Patient Stated Goal: get stronger so I can go home by the 1st of the month Progress towards PT goals: Progressing toward goals    Frequency    Min 1X/week      PT Plan Current plan remains appropriate    Co-evaluation PT/OT/SLP Co-Evaluation/Treatment: Yes Reason for Co-Treatment:  To address functional/ADL transfers PT goals addressed during session: Mobility/safety with mobility OT goals addressed during session: ADL's and self-care      AM-PAC PT "6 Clicks" Mobility   Outcome Measure  Help needed turning from your back to your side while in a flat bed without using bedrails?: None Help needed moving from lying on your back to sitting on the side of a flat bed without using bedrails?: A Little Help needed moving to and from a bed to a chair (including a wheelchair)?: A Little Help needed standing up from a chair using your arms (e.g., wheelchair or bedside chair)?: A Lot Help needed to walk in hospital room?: A Little Help needed climbing 3-5 steps with a railing? : Total 6 Click Score: 16    End of Session Equipment Utilized During Treatment: Oxygen Activity Tolerance: Patient tolerated treatment well;Patient limited by fatigue;Other (comment) Patient left: with call bell/phone within reach;in chair;with chair alarm set Nurse Communication: Mobility status PT Visit Diagnosis: Muscle weakness (generalized) (M62.81);Difficulty in walking, not elsewhere classified (R26.2)     Time: 1317-1400 PT Time Calculation (min) (ACUTE ONLY): 43 min  Charges:    $Gait Training: 8-22 mins $Therapeutic Activity: 8-22 mins                      Zadie Cleverly, PTA  Jannet Askew 01/08/2023, 3:44 PM

## 2023-01-08 NOTE — Progress Notes (Signed)
Mobility Specialist - Progress Note   01/08/23 1558  Mobility  Activity Transferred from chair to bed  Level of Assistance +2 (takes two people)  Assistive Device  Water quality scientist lift)  Activity Response Tolerated well  $Mobility charge 1 Mobility  Mobility Specialist Start Time (ACUTE ONLY) 1502  Mobility Specialist Stop Time (ACUTE ONLY) 1525  Mobility Specialist Time Calculation (min) (ACUTE ONLY) 23 min   Pt transferred chair to bed via Hoyer lift +2 for safety. Pt left supine with needs within reach.  Zetta Bills Mobility Specialist 01/08/23 4:01 PM

## 2023-01-08 NOTE — Progress Notes (Signed)
PROGRESS NOTE    JAIMON LOFGREEN  WUJ:[id number] DOB: December 16, 1955 DOA: 11/28/2022 PCP: Shayne Alken, MD  [id number]-[id number]  LOS: 39 days   Brief hospital course: SYRIS VOSBURG is a 67 y.o. 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   Mr. Jupiter stated that he ran out of his home torsemide several days ago due to a mixup with his pharmacy.  Then over the last couple days, he has noticed increasing lower extremity swelling, shortness of breath and chest pain.  He states that he usually develops chest pain when he has "fluid on his lungs."     Patient was initially diuresed with IV diuresis, responded well.  Significant improvement in peripheral edema.  IV Lasix was switched with home torsemide once creatinine started increasing.   Patient remained stable from respiratory standpoint point and remained on 6 to 8 L of supplemental oxygen via trach collar which is his baseline.   Patient developed Pseudomonas tracheitis, which was treated with Levaquin as he developed some pain around his tracheostomy.   Patient condition has stabilized, pending long-term care placement.     9/11: Started working with PT, hopefully can go home with home health soon.  Still no bed offers.   9/12: Remained hemodynamically stable.  Still does not think that he can go home without a lot of help, it seems unrealistic as no home health can provide that much of help.  TOC still working on placement.  9/22: Vitals and labs stable.  Hemoglobin was checked at his request as he was concerned that he might have had some bleeding in stool.  9/23: Remains stable, still awaiting disposition.  Assessment & Plan:  * Acute on chronic diastolic CHF (congestive heart failure) ([name]) Patient is  presenting with increased work of breathing, shortness of breath and evidence of hypervolemia on examination.  CXR showed underinflation with bilateral areas of scarring or atelectatic change, and vascular congestion. Recent echocardiogram in July 2024 with preserved EF at 55-60% and grade 1 diastolic dysfunction. Patient condition improved after initial treatment with IV furosemide, currently on oral torsemide -Continue p.o. torsemide at 40 mg daily and spironolactone --fluid restriction to 1500 ml per day   Chronic respiratory failure with hypoxia and hypercapnia ([name]) Is on 6 to 8 L of oxygen over trach collar, stable.  Pseudomonas tracheitis Patient reported pain and tenderness with light manipulation of tracheostomy.  He is chronically colonized but reported unusual symptoms. Patient has completed Levaquin. -ENT consult -no further recommendations  --pulm following --due to blood-tinged tracheostomy secretion, pt was placed on prophylactic bactrim three times per week for next few months while he is recovering, per pulm. --bleeding from tracheostomy improved after home Eliquis 2.5 mg Bid was d/c'ed on 01/02/23.  Pharm was not able to find a clear indication for pt being on Eliquis.   Severe COPD (chronic obstructive pulmonary disease) ([name]) Severe COPD on triple bronchodilator.  Current shortness of breath most likely secondary to heart failure, rather than COPD though. --home Pulmicort neb d/c'ed by pulm.  Chronic home prednisone 10 mg daily was reduced to 5 mg daily by pulm, and then d/c'ed on 01/01/23.   Chronic kidney disease, stage 3a ([name]) --Seems stable --intermittent monitor Cr while on diuretics    Hypotension --no need for midodrine  currently   OSA (obstructive sleep apnea) --cont home vent for night-time use   Chronic colitis - Continue home mesalamine   Morbid obesity with BMI of 50.0-59.9, adult [facility]) Patient has a history of morbid obesity with a BMI of 52,  complicating his current medical care. --dietician requested to discuss weight loss again with caloric restriction. --changed from 2g sodium diet to heart healthy diet which pt was more willing to accept   Chest pain, ACS ruled out Noncardiac in nature.   Anxiety Continue hydroxyzine as needed.    DVT prophylaxis: YQ:IHKVQQV Code Status: Full code  Family Communication:  Level of care: Med-Surg Dispo:   The patient is from: home Anticipated d/c is to: Likely need long-term care Anticipated d/c date is: whenever there is safe disposition. Pt set a goal to return home on 01/15/23.   Subjective and Interval History:  Patient with no new concern.  Objective: Vitals:   01/07/23 1644 01/07/23 2145 01/07/23 2307 01/08/23 0818  BP: 126/85  106/69 116/66  Pulse: 85  90 86  Resp: 18  18 16   Temp: 98.2 F (36.8 C)  98.2 F (36.8 C) (!) 97.4 F (36.3 C)  TempSrc:      SpO2: 95% 92% 95% 92%  Weight:      Height:        Intake/Output Summary (Last 24 hours) at 01/08/2023 1504 Last data filed at 01/08/2023 1000 Gross per 24 hour  Intake 240 ml  Output 300 ml  Net -60 ml   Filed Weights   12/03/22 0457 12/09/22 0500 12/24/22 0600  Weight: (!) 192.1 kg (!) 195 kg (!) 220.6 kg    Examination:   General.morbidly obese gentleman with trach collar in place, in no acute distress. Pulmonary.  Lungs clear bilaterally, normal respiratory effort. CV.  Regular rate and rhythm, no JVD, rub or murmur. Abdomen.  Soft, nontender, nondistended, BS positive. CNS.  Alert and oriented .  No focal neurologic deficit. Extremities.  1+ LE edema, no cyanosis, pulses intact and symmetrical. Psychiatry.  Judgment and insight appears normal.     Data Reviewed: I have personally reviewed labs and imaging studies  Time spent: 38 minutes  This record has been created using Conservation officer, historic buildings. Errors have been sought and corrected,but may not always be located. Such creation errors  do not reflect on the standard of care.   Arnetha Courser, MD [facility] If 7PM-7AM, please contact night-coverage 01/08/2023, 3:04 PM

## 2023-01-08 NOTE — TOC Progression Note (Signed)
Transition of Care [facility]) - Progression Note    Patient Details  Name: Gary [name] MRN: [id number] Date of Birth: May 21, 1955  Transition of Care [facility]) CM/SW Contact  Marlowe Sax, RN Phone Number: 01/08/2023, 12:22 PM  Clinical Narrative:     Requested updated weight for help in placement  Expected Discharge Plan: Skilled Nursing Facility Barriers to Discharge: Other (must enter comment) (Attempting to find SNF or LTC options as patient stated he cannot care for himself.)  Expected Discharge Plan and Services                                               Social Determinants of Health (SDOH) Interventions SDOH Screenings   Food Insecurity: No Food Insecurity (12/01/2022)  Housing: Patient Declined (12/01/2022)  Transportation Needs: No Transportation Needs (12/01/2022)  Utilities: Not At Risk (12/01/2022)  Depression (PHQ2-9): Low Risk  (06/07/2021)  Financial Resource Strain: High Risk (11/21/2022)   Received from [facility]  Physical Activity: Insufficiently Active (05/03/2017)  Social Connections: Patient Declined (11/20/2022)   Received from [facility]  Stress: Stress Concern Present (11/20/2022)   Received from [facility]  Tobacco Use: Medium Risk (12/01/2022)    Readmission Risk Interventions    11/05/2022    2:59 PM 11/05/2022   12:16 PM 12/31/2021    8:58 AM  Readmission Risk Prevention Plan  Transportation Screening Complete Complete Complete  Medication Review Oceanographer) Complete Complete Complete  PCP or Specialist appointment within 3-5 days of discharge Complete Complete Complete  HRI or Home Care Consult Complete Complete Complete  SW Recovery Care/Counseling Consult Complete Complete Complete  Palliative Care Screening Complete Complete Complete  Skilled Nursing Facility Not Applicable Not Applicable Not Applicable

## 2023-01-09 ENCOUNTER — Inpatient Hospital Stay: Payer: Medicare HMO

## 2023-01-09 DIAGNOSIS — N1831 Chronic kidney disease, stage 3a: Secondary | ICD-10-CM | POA: Diagnosis not present

## 2023-01-09 DIAGNOSIS — J441 Chronic obstructive pulmonary disease with (acute) exacerbation: Secondary | ICD-10-CM | POA: Diagnosis not present

## 2023-01-09 DIAGNOSIS — E8770 Fluid overload, unspecified: Secondary | ICD-10-CM | POA: Diagnosis not present

## 2023-01-09 DIAGNOSIS — K529 Noninfective gastroenteritis and colitis, unspecified: Secondary | ICD-10-CM | POA: Diagnosis not present

## 2023-01-09 DIAGNOSIS — J9811 Atelectasis: Secondary | ICD-10-CM | POA: Diagnosis not present

## 2023-01-09 DIAGNOSIS — J9611 Chronic respiratory failure with hypoxia: Secondary | ICD-10-CM | POA: Diagnosis not present

## 2023-01-09 DIAGNOSIS — G4733 Obstructive sleep apnea (adult) (pediatric): Secondary | ICD-10-CM | POA: Diagnosis not present

## 2023-01-09 DIAGNOSIS — I5033 Acute on chronic diastolic (congestive) heart failure: Secondary | ICD-10-CM | POA: Diagnosis not present

## 2023-01-09 DIAGNOSIS — J439 Emphysema, unspecified: Secondary | ICD-10-CM | POA: Diagnosis not present

## 2023-01-09 DIAGNOSIS — I517 Cardiomegaly: Secondary | ICD-10-CM | POA: Diagnosis not present

## 2023-01-09 DIAGNOSIS — J9602 Acute respiratory failure with hypercapnia: Secondary | ICD-10-CM | POA: Diagnosis not present

## 2023-01-09 DIAGNOSIS — J9601 Acute respiratory failure with hypoxia: Secondary | ICD-10-CM | POA: Diagnosis not present

## 2023-01-09 DIAGNOSIS — R918 Other nonspecific abnormal finding of lung field: Secondary | ICD-10-CM | POA: Diagnosis not present

## 2023-01-09 DIAGNOSIS — I9589 Other hypotension: Secondary | ICD-10-CM

## 2023-01-09 DIAGNOSIS — J9612 Chronic respiratory failure with hypercapnia: Secondary | ICD-10-CM | POA: Diagnosis not present

## 2023-01-09 LAB — BASIC METABOLIC PANEL
Anion gap: 7 (ref 5–15)
BUN: 31 mg/dL — ABNORMAL HIGH (ref 8–23)
CO2: 34 mmol/L — ABNORMAL HIGH (ref 22–32)
Calcium: 8.3 mg/dL — ABNORMAL LOW (ref 8.9–10.3)
Chloride: 97 mmol/L — ABNORMAL LOW (ref 98–111)
Creatinine, Ser: 1.39 mg/dL — ABNORMAL HIGH (ref 0.61–1.24)
GFR, Estimated: 56 mL/min — ABNORMAL LOW (ref >60)
Glucose, Bld: 98 mg/dL (ref 70–99)
Potassium: 4.1 mmol/L (ref 3.5–5.1)
Sodium: 138 mmol/L (ref 135–145)

## 2023-01-09 MED ORDER — TORSEMIDE 20 MG PO TABS
40.0000 mg | ORAL_TABLET | Freq: Two times a day (BID) | ORAL | Status: DC
  Administered 2023-01-09 – 2023-01-15 (×12): 40 mg via ORAL
  Filled 2023-01-09 (×12): qty 2

## 2023-01-09 NOTE — Plan of Care (Signed)
Problem: Education: Goal: Knowledge of General Education information will improve Description: Including pain rating scale, medication(s)/side effects and non-pharmacologic comfort measures Outcome: Progressing   Problem: Health Behavior/Discharge Planning: Goal: Ability to manage health-related needs will improve Outcome: Progressing   Problem: Clinical Measurements: Goal: Will remain free from infection Outcome: Progressing Goal: Respiratory complications will improve Outcome: Progressing   Problem: Nutrition: Goal: Adequate nutrition will be maintained Outcome: Progressing   Problem: Pain Managment: Goal: General experience of comfort will improve Outcome: Progressing

## 2023-01-09 NOTE — Progress Notes (Signed)
Patient assisted with removal of home vent and placed on 30% ATC 8L. NARD/SOB noted. PMV off at this time.

## 2023-01-09 NOTE — Progress Notes (Signed)
Mobility Specialist - Progress Note   01/09/23 1511  Mobility  Activity Transferred from chair to bed  Level of Assistance +2 (takes two people)  Assistive Device [employer] Secondary school teacher)  Activity Response Tolerated well  $Mobility charge 1 Mobility  Mobility Specialist Start Time (ACUTE ONLY) 1423  Mobility Specialist Stop Time (ACUTE ONLY) 1440  Mobility Specialist Time Calculation (min) (ACUTE ONLY) 17 min   Pt transferred from recliner to bed via [employer] lift +2 for safety. Pt left in fowler position with needs within reach.  Zetta Bills Mobility Specialist 01/09/23 3:13 PM

## 2023-01-09 NOTE — Progress Notes (Signed)
Physical Therapy Treatment Patient Details Name: Gary [name] MRN: [id number] DOB: 05/18/1955 Today's Date: 01/09/2023   History of Present Illness 67 y/o male presented to [facility] ED on 11/28/22 for chest pain x 4 days. Admitted for acute on chronic CHF. Frequent admissions this year with most recent dsicharge on 11/21/22. PMH includes obesity, hypoxia, respiratory failure with tracheostomy, COPD, GIB, HFpEF.    PT Comments  Pt was long sitting in bed upon arrival. He is A and O x 4 and agreeable to session. RN Donata Duff) in room throughout session. Pt continues to like to dictate session progression and is unwilling to attempt all desired task by author. RN staff requested pt get onto standing scale for more accurate wt. Pt remains unable/unwilling to stand from lower surface (EOB) height. His abilities to safely stand from lower surface heights is slowing his abilities/ progress. Fear an anxiety noted with transfers. Pt remained on 10 L trach collar throughout session. He was able to ambulate into hal;lway and get onto scale. Pt's wt 424.2lbs. Acute PT will continue to follow and progress per current POC.    If plan is discharge home, recommend the following: A little help with walking and/or transfers;A lot of help with bathing/dressing/bathroom;Assist for transportation   Can travel by private vehicle     No  Equipment Recommendations  Other (comment)       Precautions / Restrictions Precautions Precautions: Fall Precaution Comments: trach collar/ Trach Restrictions Weight Bearing Restrictions: No RLE Weight Bearing: Weight bearing as tolerated     Mobility  Bed Mobility Overal bed mobility: Needs Assistance Bed Mobility: Supine to Sit  Supine to sit: HOB elevated, Used rails, Supervision  General bed mobility comments: pt was able to achieve EOB sitting from long sitting with heavy use of bed rails and HOB elevated. pt continues to only be willing to perform task his own way.     Transfers Overall transfer level: Needs assistance Equipment used: Rolling walker (2 wheels) Transfers: Sit to/from Stand Sit to Stand: Supervision, From elevated surface  General transfer comment: pt was able to stand from severely elevated bd height. pt attempted to stand from lower however unable. pt presents with anxiety about transfers however once standing, is able to ambulate with BRW without assistance    Ambulation/Gait Ambulation/Gait assistance: Supervision Gait Distance (Feet): 20 Feet Assistive device: Rolling walker (2 wheels) (Bariatric) Gait Pattern/deviations: Step-through pattern Gait velocity: decreased  General Gait Details: distance limited by pt willingness. pt ambulate into hallway then onto scale to be weighed. (424lb)     Balance Overall balance assessment: Needs assistance Sitting-balance support: Feet supported, No upper extremity supported Sitting balance-[name] Scale: Good     Standing balance support: Bilateral upper extremity supported, Reliant on assistive device for balance Standing balance-[name] Scale: Fair Standing balance comment: Reliant on RW/AD for all standing activity.     Cognition Arousal: Alert Behavior During Therapy: WFL for tasks assessed/performed, Anxious Overall Cognitive Status: Within Functional Limits for tasks assessed    General Comments: continues to like to dictate session progression and unwilling to attempt task requested by author           General Comments General comments (skin integrity, edema, etc.): Author continues to encourage pt to focus on improving strength and transfer abilities versus longer gait trials.      Pertinent Vitals/Pain Pain Assessment Pain Assessment: 0-10 Pain Score: 6  Pain Location: R shoulder Pain Descriptors / Indicators: Aching Pain Intervention(s): Limited activity within patient's tolerance,  Monitored during session, Premedicated before session, Repositioned     PT Goals  (current goals can now be found in the care plan section) Acute Rehab PT Goals Patient Stated Goal: get stronger so I can go home Progress towards PT goals: Progressing toward goals    Frequency    Min 1X/week      PT Plan Current plan remains appropriate    Co-evaluation     PT goals addressed during session: Mobility/safety with mobility;Balance;Proper use of DME;Strengthening/ROM        AM-PAC PT "6 Clicks" Mobility   Outcome Measure  Help needed turning from your back to your side while in a flat bed without using bedrails?: None Help needed moving from lying on your back to sitting on the side of a flat bed without using bedrails?: A Little Help needed moving to and from a bed to a chair (including a wheelchair)?: A Little Help needed standing up from a chair using your arms (e.g., wheelchair or bedside chair)?: A Lot Help needed to walk in hospital room?: A Little Help needed climbing 3-5 steps with a railing? : Total 6 Click Score: 16    End of Session Equipment Utilized During Treatment: Oxygen (10 L trach collar throughout session) Activity Tolerance: Other (comment);Patient limited by fatigue (self limiting) Patient left: in chair;with call bell/phone within reach;with nursing/sitter in room Nurse Communication: Mobility status PT Visit Diagnosis: Muscle weakness (generalized) (M62.81);Difficulty in walking, not elsewhere classified (R26.2)     Time: 6213-0865 PT Time Calculation (min) (ACUTE ONLY): 24 min  Charges:    $Gait Training: 8-22 mins $Therapeutic Activity: 8-22 mins PT General Charges $$ ACUTE PT VISIT: 1 Visit                     Jetta Lout PTA 01/09/23, 2:15 PM

## 2023-01-09 NOTE — Progress Notes (Signed)
Lab tech reported patient refused lab draw.

## 2023-01-09 NOTE — Progress Notes (Signed)
Progress Note   Patient: Gary [name] GMW:[id number] DOB: 12-Feb-1956 DOA: 11/28/2022     40 DOS: the patient was seen and examined on 01/09/2023   Brief hospital course: Gary [name] is a 67 y.o. 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   Gary [name] stated that he ran out of his home torsemide several days ago due to a mixup with his pharmacy.  Then over the last couple days, he has noticed increasing lower extremity swelling, shortness of breath and chest pain.  He states that he usually develops chest pain when he has "fluid on his lungs."    Patient was initially diuresed with IV diuresis, responded well.  Significant improvement in peripheral edema.  IV Lasix was switched with home torsemide once creatinine started increasing.  Patient remained stable from respiratory standpoint point and remained on 6 to 8 L of supplemental oxygen via trach collar which is his baseline.  Patient developed Pseudomonas tracheitis, which was treated with Levaquin as he developed some pain around his tracheostomy.  Patient has end-stage COPD and need to continue follow-up with pulmonary as outpatient  9/25.  Patient needs a lot of assistance to get out of the bed.  Assessment and Plan: * Acute on chronic diastolic CHF (congestive heart failure) ([name]) Patient states he is not urinating enough and his swelling has increased and wants to go back on torsemide the same dose he was on at home twice a day 40 mg.  Chronic respiratory failure with hypoxia and hypercapnia ([name]) Patient on 10 L with ambulation today.  6 to 8 L at rest.  COPD (chronic obstructive pulmonary disease) ([name]) Severe COPD on Combivent, as needed albuterol  Chronic kidney disease, stage 3a ([name]) Watch  creatinine with diuresis.  Hypotension Off midodrine  OSA (obstructive sleep apnea) Continue home ventilator at night  Chest pain ACS ruled out.  Chronic colitis - Continue home mesalamine  Morbid obesity with BMI of 50.0-59.9, adult ([name]) BMI 51.64 with current height and weight in computer.        Subjective: Patient states he hardly urinates on the decreased dose of torsemide.  He wants to go back on torsemide twice a day increased dose like he did at home.  States he had some shortness of breath this morning.  States his swelling is increased.  Physical Exam: Vitals:   01/09/23 0737 01/09/23 0758 01/09/23 1121 01/09/23 1400  BP:  119/80    Pulse: 81 80 79   Resp: 18 17 18    Temp:      TempSrc:      SpO2: 96% 92% 94%   Weight:    (!) 192.4 kg  Height:       Physical Exam HENT:     Head: Normocephalic.     Mouth/Throat:     Pharynx: No oropharyngeal exudate.  Eyes:     General: Lids are normal.     Conjunctiva/sclera: Conjunctivae normal.  Cardiovascular:     Rate and Rhythm: Normal rate and regular rhythm.     Heart sounds: Normal heart sounds, S1 normal and S2 normal.  Pulmonary:     Breath sounds: Examination of the right-lower field reveals decreased breath sounds. Examination of the left-lower field reveals decreased breath sounds. Decreased breath sounds present. No wheezing,  rhonchi or rales.  Abdominal:     Palpations: Abdomen is soft.     Tenderness: There is no abdominal tenderness.  Musculoskeletal:     Right lower leg: Swelling present.     Left lower leg: Swelling present.  Skin:    General: Skin is warm.     Findings: No rash.  Neurological:     Mental Status: He is alert and oriented to person, place, and time.     Data Reviewed: Creatinine 1.39 with a GFR 56  Family Communication: Unable to reach significant other  Disposition: Status is: Inpatient Remains inpatient appropriate because: Patient will need to be able to get out of  the bed by himself or with limited help if he goes home.  Planned Discharge Destination: Home with Home Health    Time spent: 28 minutes  Author: Alford Highland, MD 01/09/2023 3:55 PM  For on call review [email or web address].
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
## 2023-01-09 NOTE — TOC Progression Note (Signed)
Transition of Care [facility]) - Progression Note    Patient Details  Name: Gary [name] MRN: [id number] Date of Birth: 1956-01-12  Transition of Care [facility]) CM/SW Contact  Marlowe Sax, RN Phone Number: 01/09/2023, 3:22 PM  Clinical Narrative:    The patient was weighed today 425 lbs  His diuretic was restarted He stated that he may be stable enough to go home by 10/1 He does have a special order Bariatric BSC in the PT office to go home with him when he is able to go home   Expected Discharge Plan: Skilled Nursing Facility Barriers to Discharge: Other (must enter comment) (Attempting to find SNF or LTC options as patient stated he cannot care for himself.)  Expected Discharge Plan and Services                                               Social Determinants of Health (SDOH) Interventions SDOH Screenings   Food Insecurity: No Food Insecurity (12/01/2022)  Housing: Patient Declined (12/01/2022)  Transportation Needs: No Transportation Needs (12/01/2022)  Utilities: Not At Risk (12/01/2022)  Depression (PHQ2-9): Low Risk  (06/07/2021)  Financial Resource Strain: High Risk (11/21/2022)   Received from [facility] Care  Physical Activity: Insufficiently Active (05/03/2017)  Social Connections: Patient Declined (11/20/2022)   Received from [facility]  Stress: Stress Concern Present (11/20/2022)   Received from [facility]  Tobacco Use: Medium Risk (12/01/2022)    Readmission Risk Interventions    11/05/2022    2:59 PM 11/05/2022   12:16 PM 12/31/2021    8:58 AM  Readmission Risk Prevention Plan  Transportation Screening Complete Complete Complete  Medication Review Oceanographer) Complete Complete Complete  PCP or Specialist appointment within 3-5 days of discharge Complete Complete Complete  HRI or Home Care Consult Complete Complete Complete  SW Recovery Care/Counseling Consult Complete Complete Complete  Palliative Care Screening Complete Complete Complete   Skilled Nursing Facility Not Applicable Not Applicable Not Applicable

## 2023-01-09 NOTE — Progress Notes (Signed)
Arrived to room to assist PT with his Home Vent, but patient instructed RT to set up at bedside for independent use. He stated that he would put himself on like he has at home. RN was at bedside and is aware of patient's decision. Currently stable on ATC. Vent has a 3L O2 bleed already on. Will continue to monitor.

## 2023-01-10 DIAGNOSIS — J9621 Acute and chronic respiratory failure with hypoxia: Secondary | ICD-10-CM

## 2023-01-10 DIAGNOSIS — I9589 Other hypotension: Secondary | ICD-10-CM | POA: Diagnosis not present

## 2023-01-10 DIAGNOSIS — J9611 Chronic respiratory failure with hypoxia: Secondary | ICD-10-CM | POA: Diagnosis not present

## 2023-01-10 DIAGNOSIS — I5033 Acute on chronic diastolic (congestive) heart failure: Secondary | ICD-10-CM | POA: Diagnosis not present

## 2023-01-10 DIAGNOSIS — E8770 Fluid overload, unspecified: Secondary | ICD-10-CM | POA: Diagnosis not present

## 2023-01-10 DIAGNOSIS — N1831 Chronic kidney disease, stage 3a: Secondary | ICD-10-CM | POA: Diagnosis not present

## 2023-01-10 DIAGNOSIS — J9602 Acute respiratory failure with hypercapnia: Secondary | ICD-10-CM | POA: Diagnosis not present

## 2023-01-10 DIAGNOSIS — J439 Emphysema, unspecified: Secondary | ICD-10-CM | POA: Diagnosis not present

## 2023-01-10 DIAGNOSIS — J441 Chronic obstructive pulmonary disease with (acute) exacerbation: Secondary | ICD-10-CM | POA: Diagnosis not present

## 2023-01-10 DIAGNOSIS — K529 Noninfective gastroenteritis and colitis, unspecified: Secondary | ICD-10-CM | POA: Diagnosis not present

## 2023-01-10 DIAGNOSIS — Z6841 Body Mass Index (BMI) 40.0 and over, adult: Secondary | ICD-10-CM | POA: Diagnosis not present

## 2023-01-10 DIAGNOSIS — J9601 Acute respiratory failure with hypoxia: Secondary | ICD-10-CM | POA: Diagnosis not present

## 2023-01-10 LAB — BASIC METABOLIC PANEL
Anion gap: 10 (ref 5–15)
BUN: 30 mg/dL — ABNORMAL HIGH (ref 8–23)
CO2: 32 mmol/L (ref 22–32)
Calcium: 8.4 mg/dL — ABNORMAL LOW (ref 8.9–10.3)
Chloride: 96 mmol/L — ABNORMAL LOW (ref 98–111)
Creatinine, Ser: 1.38 mg/dL — ABNORMAL HIGH (ref 0.61–1.24)
GFR, Estimated: 57 mL/min — ABNORMAL LOW (ref >60)
Glucose, Bld: 101 mg/dL — ABNORMAL HIGH (ref 70–99)
Potassium: 4.1 mmol/L (ref 3.5–5.1)
Sodium: 138 mmol/L (ref 135–145)

## 2023-01-10 NOTE — Plan of Care (Signed)

## 2023-01-10 NOTE — TOC Progression Note (Signed)
Transition of Care [facility]) - Progression Note    Patient Details  Name: Gary [name] MRN: [id number] Date of Birth: 09-Dec-1955  Transition of Care [facility]) CM/SW Contact  Marlowe Sax, RN Phone Number: 01/10/2023, 9:38 AM  Clinical Narrative:     Met with the patient in the room at the bedside, he is encouraged to continue to work with PT and he stated he feels that he is getting stronger and is hopeful to go home on 10/1 with [facility] pt to follow, he reports that he has a bariatric hospital bed at home  Expected Discharge Plan: Skilled Nursing Facility Barriers to Discharge: Other (must enter comment) (Attempting to find SNF or LTC options as patient stated he cannot care for himself.)  Expected Discharge Plan and Services                                               Social Determinants of Health (SDOH) Interventions SDOH Screenings   Food Insecurity: No Food Insecurity (12/01/2022)  Housing: Patient Declined (12/01/2022)  Transportation Needs: No Transportation Needs (12/01/2022)  Utilities: Not At Risk (12/01/2022)  Depression (PHQ2-9): Low Risk  (06/07/2021)  Financial Resource Strain: High Risk (11/21/2022)   Received from [facility] Care  Physical Activity: Insufficiently Active (05/03/2017)  Social Connections: Patient Declined (11/20/2022)   Received from [facility]  Stress: Stress Concern Present (11/20/2022)   Received from [facility]  Tobacco Use: Medium Risk (12/01/2022)    Readmission Risk Interventions    11/05/2022    2:59 PM 11/05/2022   12:16 PM 12/31/2021    8:58 AM  Readmission Risk Prevention Plan  Transportation Screening Complete Complete Complete  Medication Review Oceanographer) Complete Complete Complete  PCP or Specialist appointment within 3-5 days of discharge Complete Complete Complete  HRI or Home Care Consult Complete Complete Complete  SW Recovery Care/Counseling Consult Complete Complete Complete  Palliative Care Screening  Complete Complete Complete  Skilled Nursing Facility Not Applicable Not Applicable Not Applicable

## 2023-01-10 NOTE — Progress Notes (Signed)
Respiratory attempted to wean patient's O2 down to 8L. Patient's SpO2 desaturated to 87 % on 8L. Nurse increased patient to 10L and patient's O2 increased to 91%. Will continue to monitor patient. MD notified.

## 2023-01-10 NOTE — Progress Notes (Signed)
Mobility Specialist - Progress Note   01/10/23 1540  Mobility  Activity Stood at bedside;Dangled on edge of bed;Ambulated with assistance in room  Level of Assistance Standby assist, set-up cues, supervision of patient - no hands on  Assistive Device Front wheel walker  Distance Ambulated (ft) 2 ft  Range of Motion/Exercises Active;Right leg;Left leg  Activity Response Tolerated well  $Mobility charge 1 Mobility  Mobility Specialist Start Time (ACUTE ONLY) 1505  Mobility Specialist Stop Time (ACUTE ONLY) 1533  Mobility Specialist Time Calculation (min) (ACUTE ONLY) 28 min   Pt completed bed mob indep, dangled EOB ~10 mins while allowing proper O2 flow and to "catch my breath". Pt STS to RW x3 with supervision-- actively completing standing BLE marches during the first STS and taking one forward/backward step during the second STS. Pt returned to bed, left in fowler position with needs within reach.  Zetta Bills Mobility Specialist 01/10/23 3:48 PM
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
## 2023-01-10 NOTE — Progress Notes (Signed)
Progress Note   Patient: Gary [name] VHQ:[id number] DOB: June 11, 1955 DOA: 11/28/2022     41 DOS: the patient was seen and examined on 01/10/2023   Brief hospital course: Gary [name] is a 67 y.o. 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   Gary [name] stated that he ran out of his home torsemide several days ago due to a mixup with his pharmacy.  Then over the last couple days, he has noticed increasing lower extremity swelling, shortness of breath and chest pain.  He states that he usually develops chest pain when he has "fluid on his lungs."    Patient was initially diuresed with IV diuresis, responded well.  Significant improvement in peripheral edema.  IV Lasix was switched with home torsemide once creatinine started increasing.  Patient remained stable from respiratory standpoint point and remained on 6 to 8 L of supplemental oxygen via trach collar which is his baseline.  Patient developed Pseudomonas tracheitis, which was treated with Levaquin as he developed some pain around his tracheostomy.  Patient has end-stage COPD and need to continue follow-up with pulmonary as outpatient  9/25.  Patient needs a lot of assistance to get out of the bed.  Assessment and Plan: * Acute on chronic diastolic CHF (congestive heart failure) ([name]) Continue torsemide 40 mg twice a day for now, continue spironolactone  Chronic respiratory failure with hypoxia and hypercapnia ([name]) Continue trach collar 6 to 8 L at rest.  Continue ventilator at night.  COPD (chronic obstructive pulmonary disease) ([name]) Severe COPD on Combivent, as needed albuterol  Chronic kidney disease, stage 3a ([name]) Watch creatinine with diuresis.  Hypotension Off midodrine  OSA (obstructive  sleep apnea) Continue home ventilator at night  Chest pain ACS ruled out.  Chronic colitis - Continue home mesalamine  Morbid obesity with BMI of 50.0-59.9, adult ([name]) BMI 51.73 with current height and weight in computer.        Subjective: Patient states he still swollen and wants to be on torsemide 40 mg twice daily.  Will continue today and continue to monitor.  At times he gets short of breath.  Patient is interested in going home.  Needs to get stronger with getting out of the bed.  Physical Exam: Vitals:   01/09/23 2327 01/10/23 0500 01/10/23 0829 01/10/23 1556  BP: 130/86  123/70 121/77  Pulse: 91  76 84  Resp: (!) 22  19 18   Temp: 98.2 F (36.8 C)  97.9 F (36.6 C) 98.5 F (36.9 C)  TempSrc:   Oral   SpO2: 90%  91% 90%  Weight:  (!) 192.8 kg    Height:       Physical Exam HENT:     Head: Normocephalic.     Mouth/Throat:     Pharynx: No oropharyngeal exudate.  Eyes:     General: Lids are normal.     Conjunctiva/sclera: Conjunctivae normal.  Cardiovascular:     Rate and Rhythm: Normal rate and regular rhythm.     Heart sounds: Normal heart sounds, S1 normal and S2 normal.  Pulmonary:     Breath sounds: Examination of the right-lower field reveals decreased breath sounds. Examination of the left-lower field reveals decreased breath sounds. Decreased breath sounds present. No wheezing, rhonchi or rales.  Abdominal:  Palpations: Abdomen is soft.     Tenderness: There is no abdominal tenderness.  Musculoskeletal:     Right lower leg: Swelling present.     Left lower leg: Swelling present.  Skin:    General: Skin is warm.     Findings: No rash.  Neurological:     Mental Status: He is alert and oriented to person, place, and time.     Data Reviewed: Creatinine 1.38's  Family Communication: unable to contact significant other  Disposition: Status is: Inpatient Remains inpatient appropriate because: Needs to get stronger with getting out of bed in  order to go home.  Planned Discharge Destination: Home with Home Health versus rehab    Time spent: 27 minutes  Author: Alford Highland, MD 01/10/2023 4:21 PM  For on call review [email or web address].

## 2023-01-10 NOTE — Progress Notes (Signed)
PT Cancellation Note  Patient Details Name: JAMAAL STAGER MRN: [id number] DOB: 11/13/1955   Cancelled Treatment:     PT attempt. Pt unwilling to participate at this time. " I need to go to th BR on bedpan before I can work with you. I'll just wait and have OT later." Author discussed that it's not guaranteed that OT will come today but was encouraged to participate with whoever comes to assist him with OOB activity, weather that be with OT or mobility techs. " I'll get up at 1."  Acute PT will continue to follow per current POC advancing as able per pt willingness.    [name] 01/10/2023, 9:55 AM

## 2023-01-10 NOTE — Progress Notes (Signed)
Attempted trach care Pt sleeping. Will attempt again later today

## 2023-01-10 NOTE — Plan of Care (Signed)
Problem: Education: Goal: Knowledge of General Education information will improve Description: Including pain rating scale, medication(s)/side effects and non-pharmacologic comfort measures Outcome: Progressing   Problem: Nutrition: Goal: Adequate nutrition will be maintained Outcome: Progressing   Problem: Pain Managment: Goal: General experience of comfort will improve Outcome: Progressing   Problem: Safety: Goal: Ability to remain free from injury will improve Outcome: Progressing

## 2023-01-11 DIAGNOSIS — I959 Hypotension, unspecified: Secondary | ICD-10-CM | POA: Diagnosis not present

## 2023-01-11 DIAGNOSIS — J9602 Acute respiratory failure with hypercapnia: Secondary | ICD-10-CM | POA: Diagnosis not present

## 2023-01-11 DIAGNOSIS — K529 Noninfective gastroenteritis and colitis, unspecified: Secondary | ICD-10-CM | POA: Diagnosis not present

## 2023-01-11 DIAGNOSIS — I5033 Acute on chronic diastolic (congestive) heart failure: Secondary | ICD-10-CM | POA: Diagnosis not present

## 2023-01-11 DIAGNOSIS — J9601 Acute respiratory failure with hypoxia: Secondary | ICD-10-CM | POA: Diagnosis not present

## 2023-01-11 DIAGNOSIS — E8770 Fluid overload, unspecified: Secondary | ICD-10-CM | POA: Diagnosis not present

## 2023-01-11 DIAGNOSIS — J439 Emphysema, unspecified: Secondary | ICD-10-CM | POA: Diagnosis not present

## 2023-01-11 DIAGNOSIS — J9611 Chronic respiratory failure with hypoxia: Secondary | ICD-10-CM | POA: Diagnosis not present

## 2023-01-11 DIAGNOSIS — J441 Chronic obstructive pulmonary disease with (acute) exacerbation: Secondary | ICD-10-CM | POA: Diagnosis not present

## 2023-01-11 DIAGNOSIS — N1831 Chronic kidney disease, stage 3a: Secondary | ICD-10-CM | POA: Diagnosis not present

## 2023-01-11 DIAGNOSIS — J9612 Chronic respiratory failure with hypercapnia: Secondary | ICD-10-CM | POA: Diagnosis not present

## 2023-01-11 DIAGNOSIS — G4733 Obstructive sleep apnea (adult) (pediatric): Secondary | ICD-10-CM | POA: Diagnosis not present

## 2023-01-11 MED ORDER — TROLAMINE SALICYLATE 10 % EX CREA
TOPICAL_CREAM | CUTANEOUS | Status: DC | PRN
  Filled 2023-01-11: qty 85

## 2023-01-11 MED ORDER — HYDROXYZINE HCL 50 MG PO TABS
25.0000 mg | ORAL_TABLET | Freq: Three times a day (TID) | ORAL | Status: DC | PRN
  Administered 2023-01-11 – 2023-03-27 (×62): 25 mg via ORAL
  Filled 2023-01-11 (×71): qty 1

## 2023-01-11 MED ORDER — ARFORMOTEROL TARTRATE 15 MCG/2ML IN NEBU
15.0000 ug | INHALATION_SOLUTION | Freq: Two times a day (BID) | RESPIRATORY_TRACT | Status: DC
  Administered 2023-01-11 – 2023-02-19 (×70): 15 ug via RESPIRATORY_TRACT
  Filled 2023-01-11 (×80): qty 2

## 2023-01-11 MED ORDER — REVEFENACIN 175 MCG/3ML IN SOLN
175.0000 ug | Freq: Every day | RESPIRATORY_TRACT | Status: DC
  Administered 2023-01-12 – 2023-02-10 (×30): 175 ug via RESPIRATORY_TRACT
  Filled 2023-01-11 (×31): qty 3

## 2023-01-11 MED ORDER — ACETAMINOPHEN 325 MG PO TABS
650.0000 mg | ORAL_TABLET | ORAL | Status: DC | PRN
  Administered 2023-01-11 – 2023-01-28 (×8): 650 mg via ORAL
  Filled 2023-01-11 (×9): qty 2

## 2023-01-11 NOTE — Assessment & Plan Note (Addendum)
Continue working with physical therapy and mobility specialist to work on getting out of the bed and ambulating.  Continue working on standing from commode and chair.  Continue also doing exercises in the bed.  Spoke with transitional care team and continue to enforce building up strength for discharge on 04/22/23.

## 2023-01-11 NOTE — TOC Progression Note (Signed)
Transition of Care [facility]) - Progression Note    Patient Details  Name: Gary [name] MRN: [id number] Date of Birth: July 31, 1955  Transition of Care [facility] CM/SW Contact  Marlowe Sax, RN Phone Number: 01/11/2023, 11:25 AM  Clinical Narrative:     A company came to the hospital a while back  and measured the patient for and approved the patient for a bariatric lift chair, once he is home he will call them and arrange delivery,   Expected Discharge Plan: Skilled Nursing Facility Barriers to Discharge: Other (must enter comment) (Attempting to find SNF or LTC options as patient stated he cannot care for himself.)  Expected Discharge Plan and Services                                               Social Determinants of Health (SDOH) Interventions SDOH Screenings   Food Insecurity: No Food Insecurity (12/01/2022)  Housing: Patient Declined (12/01/2022)  Transportation Needs: No Transportation Needs (12/01/2022)  Utilities: Not At Risk (12/01/2022)  Depression (PHQ2-9): Low Risk  (06/07/2021)  Financial Resource Strain: High Risk (11/21/2022)   [facility] from [facility]  Physical Activity: Insufficiently Active (05/03/2017)  Social Connections: Patient Declined (11/20/2022)   Received from [facility]  Stress: Stress Concern Present (11/20/2022)   Received from [facility]  Tobacco Use: Medium Risk (12/01/2022)    Readmission Risk Interventions    11/05/2022    2:59 PM 11/05/2022   12:16 PM 12/31/2021    8:58 AM  Readmission Risk Prevention Plan  Transportation Screening Complete Complete Complete  Medication Review Ocean[employer]) Complete Complete Complete  PCP or Specialist appointment within 3-5 days of discharge Complete Complete Complete  HRI or Home Care Consult Complete Complete Complete  SW Recovery Care/Counseling Consult Complete Complete Complete  Palliative Care Screening Complete Complete Complete  Skilled Nursing Facility Not Applicable Not  Applicable Not Applicable

## 2023-01-11 NOTE — Progress Notes (Signed)
Physical Therapy Treatment Patient Details Name: Gary [name] MRN: [id number] DOB: [birth year] Today's Date: 01/11/2023   History of Present Illness 67 y/o male presented to [facility] ED on 11/28/22 for chest pain x 4 days. Admitted for acute on chronic CHF. Frequent admissions this year with most recent dsicharge on 11/21/22. PMH includes obesity, hypoxia, respiratory failure with tracheostomy, COPD, GIB, HFpEF.    PT Comments  Pt was long sitting in bed upon arrival. Discussed Dc planning and if he had talked to his help/assistance that will be helping him at DC." I talked to them today." Pt continues to be unwilling to attempt desired task. He dictates session progression and is unwilling to attempt standing from lower surfaces." If I can just walk better I'll be good to go home.I just need HH to come out to the house." He stood from extremely elevated bed height and ambulate 100 ft in hallway with chair follow. No LOB however distance limited by fatigue ("My arms are giving out.") pt was on 10 L mobile trach collar during gait training. Acute PT will continue to follow per current POC advancing as able per pt willingness. Pushed back to room in bariatric recliner. Mobility tech will assist pt back to bed later this afternoon via lift.    If plan is discharge home, recommend the following: A little help with walking and/or transfers;A lot of help with bathing/dressing/bathroom;Assist for transportation     Equipment Recommendations  None recommended by PT (PT department has a bariatric BSC for pt to Dc home with.)       Precautions / Restrictions Precautions Precautions: Fall Precaution Comments: trach collar/ Trach Restrictions Weight Bearing Restrictions: Yes RLE Weight Bearing: Weight bearing as tolerated     Mobility  Bed Mobility Overal bed mobility: Modified Independent Bed Mobility: Supine to Sit  Supine to sit: HOB elevated, Used rails, Supervision  General bed mobility comments: no  physical asistance however hevy use of bed fuctions and bed rail to achieve EOB sitting    Transfers Overall transfer level: Needs assistance Equipment used: Rolling walker (2 wheels) Transfers: Sit to/from Stand Sit to Stand: Supervision, From elevated surface     Ambulation/Gait Ambulation/Gait assistance: Supervision Gait Distance (Feet): 100 Feet Assistive device: Rolling walker (2 wheels) (Bariatric) Gait Pattern/deviations: WFL(Within Functional Limits) Gait velocity: decreased  General Gait Details: pt tolerated increase gait distance.   Balance Overall balance assessment: Needs assistance Sitting-balance support: Feet supported, No upper extremity supported Sitting balance-[name] Scale: Good     Standing balance support: Bilateral upper extremity supported, Reliant on assistive device for balance Standing balance-[name] Scale: Fair Standing balance comment: Reliant on RW/AD for all standing activity.      Cognition Arousal: Alert Behavior During Therapy: WFL for tasks assessed/performed, Anxious Overall Cognitive Status: Within Functional Limits for tasks assessed          General Comments General comments (skin integrity, edema, etc.): discussed at length upcoming DC." Ill be ready once they have some HH lined up.I just need to eb able to walk." Authro discussed more ADL task/concerns more pressing than walking. pt continues to be hyper focused on improving gait abilities versus any/all other PT deficits/concerns.      Pertinent Vitals/Pain Pain Assessment Pain Assessment: 0-10 Pain Score: 2  Pain Location: R shoulder Pain Intervention(s): Limited activity within patient's tolerance, Monitored during session, Premedicated before session, Repositioned, Ice applied     PT Goals (current goals can now be found in the care plan section) Acute  Rehab PT Goals Patient Stated Goal: walk better so I can go home Progress towards PT goals: Progressing toward goals     Frequency    Min 1X/week      PT Plan Current plan remains appropriate    Co-evaluation     PT goals addressed during session: Mobility/safety with mobility;Balance;Proper use of DME;Strengthening/ROM        AM-PAC PT "6 Clicks" Mobility   Outcome Measure  Help needed turning from your back to your side while in a flat bed without using bedrails?: None Help needed moving from lying on your back to sitting on the side of a flat bed without using bedrails?: A Little Help needed moving to and from a bed to a chair (including a wheelchair)?: A Little Help needed standing up from a chair using your arms (e.g., wheelchair or bedside chair)?: A Lot Help needed to walk in hospital room?: A Little Help needed climbing 3-5 steps with a railing? : Total 6 Click Score: 16    End of Session Equipment Utilized During Treatment: Oxygen (10 L trach collar during gait) Activity Tolerance: Patient tolerated treatment well;Other (comment) (remains self limiting) Patient left: in chair;with call bell/phone within reach;with nursing/sitter in room Nurse Communication: Mobility status PT Visit Diagnosis: Muscle weakness (generalized) (M62.81);Difficulty in walking, not elsewhere classified (R26.2)     Time: 0102-7253 PT Time Calculation (min) (ACUTE ONLY): 26 min  Charges:    $Gait Training: 8-22 mins $Therapeutic Activity: 8-22 mins PT General Charges $$ ACUTE PT VISIT: 1 Visit                     Jetta Lout PTA 01/11/23, 2:12 PM

## 2023-01-11 NOTE — Progress Notes (Signed)
Progress Note   Patient: Gary [name] ZOX:[id number] DOB: Feb 06, 1956 DOA: 11/28/2022     42 DOS: the patient was seen and examined on 01/11/2023   Brief hospital course: Gary [name] is a 67 y.o. 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   [name] stated that he ran out of his home torsemide several days ago due to a mixup with his pharmacy.  Then over the last couple days, he has noticed increasing lower extremity swelling, shortness of breath and chest pain.  He states that he usually develops chest pain when he has "fluid on his lungs."    Patient was initially diuresed with IV diuresis, responded well.  Significant improvement in peripheral edema.  IV Lasix was switched with home torsemide once creatinine started increasing.  Patient remained stable from respiratory standpoint point and remained on 6 to 8 L of supplemental oxygen via trach collar which is his baseline.  Patient developed Pseudomonas tracheitis, which was treated with Levaquin as he developed some pain around his tracheostomy.  Patient has end-stage COPD and need to continue follow-up with pulmonary as outpatient  9/25.  Patient needs a lot of assistance to get out of the bed.  Assessment and Plan: * Acute on chronic diastolic CHF (congestive heart failure) ([name]) Continue torsemide 40 mg twice a day for now, continue spironolactone  Chronic respiratory failure with hypoxia and hypercapnia ([name]) Continue trach collar 10 L.  Continue ventilator at night.  COPD (chronic obstructive pulmonary disease) ([name]) Severe COPD on Combivent, as needed albuterol.  On yuperli nebulizer.  Chronic kidney disease, stage 3a ([name]) Watch creatinine with diuresis.  Hypotension Off midodrine  OSA  (obstructive sleep apnea) Continue home ventilator at night  Chest pain ACS ruled out.  Chronic colitis - Continue home mesalamine  Generalized weakness Continue working with physical therapy and mobility specialist to work on getting out of the bed.  Patient able to ambulate once out of the bed.  Morbid obesity with BMI of 50.0-59.9, adult ([name]) BMI 51.73 with current height and weight in computer.       Subjective: Patient feels okay.  Occasionally has trouble breathing.  Advised to continue working on his strength so we can get home.  Physical Exam: Vitals:   01/10/23 1556 01/10/23 2058 01/11/23 0903 01/11/23 1547  BP: 121/77  117/73 (!) 127/92  Pulse: 84  86 87  Resp: 18  14 14   Temp: 98.5 F (36.9 C)  97.7 F (36.5 C) 98.5 F (36.9 C)  TempSrc: Oral     SpO2: 90% 95% 100% 94%  Weight:      Height:       Physical Exam HENT:     Head: Normocephalic.     Mouth/Throat:     Pharynx: No oropharyngeal exudate.  Eyes:     General: Lids are normal.     Conjunctiva/sclera: Conjunctivae normal.  Cardiovascular:     Rate and Rhythm: Normal rate and regular rhythm.     Heart sounds: Normal heart sounds, S1 normal and S2 normal.  Pulmonary:     Breath sounds: Examination of the right-lower field reveals decreased breath sounds. Examination of the left-lower field reveals decreased breath sounds. Decreased breath sounds present. No wheezing, rhonchi or rales.  Abdominal:  Palpations: Abdomen is soft.     Tenderness: There is no abdominal tenderness.  Musculoskeletal:     Right lower leg: Swelling present.     Left lower leg: Swelling present.  Skin:    General: Skin is warm.     Findings: No rash.  Neurological:     Mental Status: He is alert and oriented to person, place, and time.     Data Reviewed: Creatinine 1.38   Disposition: Status is: Inpatient Remains inpatient appropriate because: He needs to build up his strength where he can get out of the bed  on his own.  Planned Discharge Destination: Home with Home Health    Time spent: 28 minutes  Author: Alford Highland, MD 01/11/2023 5:10 PM  For on call review [email or web address].

## 2023-01-11 NOTE — Plan of Care (Signed)
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
## 2023-01-11 NOTE — Progress Notes (Signed)
Mobility Specialist - Progress Note   01/11/23 1524  Mobility  Activity Transferred from chair to bed  Level of Assistance +2 (takes two people)  Assistive Device  Secondary school teacher)  Activity Response Tolerated well  $Mobility charge 1 Mobility   Pt sitting in recliner upon entry, utilizing RA. Pt transferred to bed via [employer] +2 for safety. Pt left in fowler position with needs within reach.  Zetta Bills Mobility Specialist 01/11/23 3:25 PM

## 2023-01-12 DIAGNOSIS — I5033 Acute on chronic diastolic (congestive) heart failure: Secondary | ICD-10-CM | POA: Diagnosis not present

## 2023-01-12 DIAGNOSIS — K529 Noninfective gastroenteritis and colitis, unspecified: Secondary | ICD-10-CM | POA: Diagnosis not present

## 2023-01-12 DIAGNOSIS — J9611 Chronic respiratory failure with hypoxia: Secondary | ICD-10-CM | POA: Diagnosis not present

## 2023-01-12 DIAGNOSIS — J9602 Acute respiratory failure with hypercapnia: Secondary | ICD-10-CM | POA: Diagnosis not present

## 2023-01-12 DIAGNOSIS — R531 Weakness: Secondary | ICD-10-CM

## 2023-01-12 DIAGNOSIS — I9589 Other hypotension: Secondary | ICD-10-CM | POA: Diagnosis not present

## 2023-01-12 DIAGNOSIS — N1831 Chronic kidney disease, stage 3a: Secondary | ICD-10-CM | POA: Diagnosis not present

## 2023-01-12 DIAGNOSIS — J9601 Acute respiratory failure with hypoxia: Secondary | ICD-10-CM | POA: Diagnosis not present

## 2023-01-12 DIAGNOSIS — E8770 Fluid overload, unspecified: Secondary | ICD-10-CM | POA: Diagnosis not present

## 2023-01-12 DIAGNOSIS — G4733 Obstructive sleep apnea (adult) (pediatric): Secondary | ICD-10-CM | POA: Diagnosis not present

## 2023-01-12 DIAGNOSIS — J441 Chronic obstructive pulmonary disease with (acute) exacerbation: Secondary | ICD-10-CM | POA: Diagnosis not present

## 2023-01-12 DIAGNOSIS — J439 Emphysema, unspecified: Secondary | ICD-10-CM | POA: Diagnosis not present

## 2023-01-12 MED ORDER — ENOXAPARIN SODIUM 100 MG/ML IJ SOSY
100.0000 mg | PREFILLED_SYRINGE | INTRAMUSCULAR | Status: DC
  Administered 2023-01-12 – 2023-02-08 (×6): 100 mg via SUBCUTANEOUS
  Filled 2023-01-12 (×29): qty 1

## 2023-01-12 NOTE — Progress Notes (Signed)
Patient is not able to walk the distance required to go the bathroom, or he/she is unable to safely negotiate stairs required to access the bathroom.  A BSC will alleviate this problem.  Kemper Durie, RN, MSN, CCM [facility]  Registered Nurse Case Production designer, theatre/television/film

## 2023-01-12 NOTE — Plan of Care (Signed)
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
## 2023-01-12 NOTE — Progress Notes (Signed)
Progress Note   Patient: Gary [name] WUJ:[id number] DOB: [birth year] DOA: 11/28/2022     43 DOS: the patient was seen and examined on 01/12/2023   Brief hospital course: JAKORIAN GATLING is a 67 y.o. 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   Mr. Harney stated that he ran out of his home torsemide several days ago due to a mixup with his pharmacy.  Then over the last couple days, he has noticed increasing lower extremity swelling, shortness of breath and chest pain.  He states that he usually develops chest pain when he has "fluid on his lungs."    Patient was initially diuresed with IV diuresis, responded well.  Significant improvement in peripheral edema.  IV Lasix was switched with home torsemide once creatinine started increasing.  Patient remained stable from respiratory standpoint point and remained on 6 to 8 L of supplemental oxygen via trach collar which is his baseline.  Patient developed Pseudomonas tracheitis, which was treated with Levaquin as he developed some pain around his tracheostomy.  Patient has end-stage COPD and need to continue follow-up with pulmonary as outpatient  9/25.  Patient needs a lot of assistance to get out of the bed.  Assessment and Plan: * Acute on chronic diastolic CHF (congestive heart failure) ([name]) Continue torsemide 40 mg twice a day for now, continue spironolactone  Chronic respiratory failure with hypoxia and hypercapnia ([name]) Continue trach collar 10 L.  Continue ventilator at night.  COPD (chronic obstructive pulmonary disease) ([name]) Severe COPD on Combivent, as needed albuterol.  On yuperli nebulizer.  Chronic kidney disease, stage 3a ([name]) Watch creatinine with diuresis.  Hypotension Off midodrine  OSA  (obstructive sleep apnea) Continue home ventilator at night  Chest pain ACS ruled out.  Chronic colitis - Continue home mesalamine  Generalized weakness Continue working with physical therapy and mobility specialist to work on getting out of the bed.  Patient able to ambulate once out of the bed.  Morbid obesity with BMI of 50.0-59.9, adult ([name]) BMI 51.73 with current height and weight in computer.        Subjective: Patient felt okay for me asking him about questions about discharge.  Patient has told the care manager and physical therapist that he may be ready to get out of the hospital on 10/1.  When I mention this to him he started getting very upset and started yelling at me.  He does not want to be rushed out of the hospital.  He needs help at home.  He needs this medication.  Admitted 44 days ago with chest pain and leg swelling.  Physical Exam: Vitals:   01/11/23 2334 01/12/23 0200 01/12/23 0751 01/12/23 0801  BP: 107/76  114/79   Pulse: 86  85 82  Resp: 18  14 20   Temp: 97.8 F (36.6 C)  98.2 F (36.8 C)   TempSrc: Oral     SpO2: 94% 95% 94% 95%  Weight:      Height:       Physical Exam HENT:     Head: Normocephalic.     Mouth/Throat:     Pharynx: No oropharyngeal exudate.  Eyes:     General: Lids are normal.     Conjunctiva/sclera: Conjunctivae normal.  Cardiovascular:     Rate and  Rhythm: Normal rate and regular rhythm.     Heart sounds: Normal heart sounds, S1 normal and S2 normal.  Pulmonary:     Breath sounds: Examination of the right-lower field reveals decreased breath sounds. Examination of the left-lower field reveals decreased breath sounds. Decreased breath sounds present. No wheezing, rhonchi or rales.  Abdominal:     Palpations: Abdomen is soft.     Tenderness: There is no abdominal tenderness.  Musculoskeletal:     Right lower leg: Swelling present.     Left lower leg: Swelling present.  Skin:    General: Skin is warm.     Findings: No  rash.  Neurological:     Mental Status: He is alert and oriented to person, place, and time.     Data Reviewed: I ordered a BMP this morning but not drawn.  Order a stat BMP now.  Disposition: Status is: Inpatient Remains inpatient appropriate because: Patient started yelling at me when I started talking about potential discharge next week.  Patient needs to continue to work with physical therapy to get out of the bed.  TOC setting up home health, I offered to prescribe medications for him prior to disposition.  Planned Discharge Destination: Home with Home Health    Time spent: 27 minutes Case discussed with physical therapist.  Author: Alford Highland, MD 01/12/2023 2:27 PM  For on call review [email or web address].

## 2023-01-12 NOTE — TOC Progression Note (Signed)
Transition of Care [name]) - Progression Note    Patient Details  Name: Gary [name] MRN: [id number] Date of Birth: 08/27/1955  Transition of Care [facility] CM/SW Contact  Kemper Durie, RN Phone Number: 01/12/2023, 2:28 PM  Clinical Narrative:     Plan for patient to be discharged home on Tuesday.  Bariatric BSC ordered to be delivered tomorrow as patient will need EMS transport home and family will have to take DME home over the weekend.  [facility], aware and will process the request.  HHPT/OT ordered, referral accepted by [facility] with [name].  Will continue to plan for EMS transport home on Tuesday.  Expected Discharge Plan: Home w Home Health Services Barriers to Discharge: Continued Medical Work up  Expected Discharge Plan and Services     Post Acute Care Choice: Home Health                   DME Arranged: Bedside commode DME Agency: [facility] Date DME Agency Contacted: 01/12/23 Time DME Agency Contacted: 1427 Representative spoke with at DME Agency: [facility] HH Arranged: PT, OT HH Agency: [facility] Date [facility] Agency Contacted: 01/12/23 Time HH Agency Contacted: 1428 Representative spoke with at [facility] Agency: Kandee Keen   Social Determinants of Health (SDOH) Interventions SDOH Screenings   Food Insecurity: No Food Insecurity (12/01/2022)  Housing: Patient Declined (12/01/2022)  Transportation Needs: No Transportation Needs (12/01/2022)  Utilities: Not At Risk (12/01/2022)  Depression (PHQ2-9): Low Risk  (06/07/2021)  Financial Resource Strain: High Risk (11/21/2022)   Received from [facility]  Physical Activity: Insufficiently Active (05/03/2017)  Social Connections: Patient Declined (11/20/2022)   Received from [facility]  Stress: Stress Concern Present (11/20/2022)   Received from [facility]  Tobacco Use: Medium Risk (12/01/2022)    Readmission Risk Interventions    11/05/2022    2:59 PM 11/05/2022   12:16 PM 12/31/2021    8:58 AM  Readmission Risk Prevention  Plan  Transportation Screening Complete Complete Complete  Medication Review Oceanographer) Complete Complete Complete  PCP or Specialist appointment within 3-5 days of discharge Complete Complete Complete  HRI or Home Care Consult Complete Complete Complete  SW Recovery Care/Counseling Consult Complete Complete Complete  Palliative Care Screening Complete Complete Complete  Skilled Nursing Facility Not Applicable Not Applicable Not Applicable

## 2023-01-12 NOTE — Progress Notes (Signed)
Mobility Specialist - Progress Note   01/12/23 1542  Mobility  Activity Transferred from chair to bed  Level of Assistance +2 (takes two people)  Assistive Device Other (Comment) Secondary school teacher)  Distance Ambulated (ft) 0 ft  Activity Response Tolerated well  Mobility Referral Yes  $Mobility charge 1 Mobility  Mobility Specialist Start Time (ACUTE ONLY) 1501  Mobility Specialist Stop Time (ACUTE ONLY) 1539  Mobility Specialist Time Calculation (min) (ACUTE ONLY) 38 min   Transfer via hoyer lift recliner to bed. Pt left in bed with needs in reach.   [name]  Mobility Specialist  01/12/23 3:45 PM

## 2023-01-12 NOTE — Progress Notes (Signed)
Physical Therapy Treatment Patient Details Name: Gary [name] MRN: [id number] DOB: 1955/07/25 Today's Date: 01/12/2023   History of Present Illness 67 y/o male presented to [facility] ED on 11/28/22 for chest pain x 4 days. Admitted for acute on chronic CHF. Frequent admissions this year with most recent dsicharge on 11/21/22. PMH includes obesity, hypoxia, respiratory failure with tracheostomy, COPD, GIB, HFpEF.    PT Comments  Pt was long sitting in bed upon arrival. He was finishing lunch and agreeable to OOB activity. Pt however continues to dictate session progression. Discussed DC barriers. " Ill be ready to go home once they have my home health help lined up. I'm calling [employer] on Monday to go check my home vent and make sure that's working."  Encouraged pt to have caregiver/ girlfriend come in for training sessions or even just talk to care team for improved outcomes.  Pt remains limited overall. He is able to exit L side of bed without physical assistance with heavy use of bed rails + HOB elevated. Total assistance to apply his shoes in sitting. Pt has fear + unable to stand form standard surface heights. He required bed height to be extremely elevated to stand. Unwilling to attempt from lower surfaces but can stand form elevated surface without physical assistance. He tolerates ambulation ~ 100 ft prior to needing seated rest. Once seated in recliner, pt is unable/unwilling to stand from lower recliner surface. Acute PT will continue to follow per current POC progressing as able per pt willingness. Dc recs remain appropriate with pt having available/intermittent assistance at home.      If plan is discharge home, recommend the following: A lot of help with bathing/dressing/bathroom;Assist for transportation;A lot of help with walking and/or transfers;A little help with walking and/or transfers (little help walking, alot of help standing form standard heights)     Equipment Recommendations  Other  (comment);BSC/3in1 (Bariatric BSC delivered to room for pt's family to take to his apartment for home use)       Precautions / Restrictions Precautions Precautions: Fall Precaution Comments: trach collar/ Trach (Pt demands 10 L o2) Restrictions Weight Bearing Restrictions: Yes RLE Weight Bearing: Weight bearing as tolerated     Mobility  Bed Mobility Overal bed mobility: Modified Independent Bed Mobility: Supine to Sit  Supine to sit: HOB elevated, Used rails, Supervision  General bed mobility comments: Pt requires heavy use of bed functions and bed rails to exit bed. (pt has bariatric elevating hospital bed at home)    Transfers Overall transfer level: Needs assistance Equipment used: Rolling walker (2 wheels) (Bariatric) Transfers: Sit to/from Stand Sit to Stand: Supervision, From elevated surface    General transfer comment: Pt is able to stand form elevated bed height without physical assistance however bed height elevated to pt's comfort level. pt is fearfull/unwilling to stand/ or  even attempt standing from lower (standard) surface height    Ambulation/Gait Ambulation/Gait assistance: Supervision Gait Distance (Feet): 100 Feet Assistive device: Rolling walker (2 wheels) Gait Pattern/deviations: WFL(Within Functional Limits) Gait velocity: decreased     General Gait Details: Pt was able to ambulate 100 ft in hallway with chair follow +2 standing rest. Author continues to re-enforce the importance of improving transfers versus gait distances. Pt continues to be focused on only wanting to improve gait abilities.   Balance Overall balance assessment: Needs assistance Sitting-balance support: Feet supported, No upper extremity supported Sitting balance-[name] Scale: Good     Standing balance support: Bilateral upper extremity supported, Reliant  on assistive device for balance Standing balance-[name] Scale: Fair Standing balance comment: Reliant on RW/AD for all standing  activity.     Cognition Arousal: Alert Behavior During Therapy: WFL for tasks assessed/performed Overall Cognitive Status: Within Functional Limits for tasks assessed    General Comments: Pt continues to have poor insight of situation and safety concerns with his limited abilities to stand from reasonable heights. " I'll be ready to go home as soonas they line up my HH help." Author discussed returning to LTC facility and pt states refusal to even consider going.               Pertinent Vitals/Pain Pain Assessment Pain Assessment: 0-10 Pain Score: 3  Pain Location: R arm Pain Descriptors / Indicators: Sore Pain Intervention(s): Monitored during session, Limited activity within patient's tolerance     PT Goals (current goals can now be found in the care plan section) Acute Rehab PT Goals Patient Stated Goal: "Walk better so I can go home." Progress towards PT goals: Not progressing toward goals - comment (Pt's unwillingness to attempt desired task makes progress limited)    Frequency    Min 1X/week      PT Plan Current plan remains appropriate       AM-PAC PT "6 Clicks" Mobility   Outcome Measure  Help needed turning from your back to your side while in a flat bed without using bedrails?: None Help needed moving from lying on your back to sitting on the side of a flat bed without using bedrails?: A Little Help needed moving to and from a bed to a chair (including a wheelchair)?: A Little Help needed standing up from a chair using your arms (e.g., wheelchair or bedside chair)?: A Lot Help needed to walk in hospital room?: A Little Help needed climbing 3-5 steps with a railing? : Total 6 Click Score: 16    End of Session Equipment Utilized During Treatment: Oxygen Activity Tolerance: Patient tolerated treatment well Patient left: in chair;with call bell/phone within reach Nurse Communication: Mobility status PT Visit Diagnosis: Muscle weakness (generalized)  (M62.81);Difficulty in walking, not elsewhere classified (R26.2)     Time: 5409-8119 PT Time Calculation (min) (ACUTE ONLY): 20 min  Charges:    $Gait Training: 8-22 mins PT General Charges $$ ACUTE PT VISIT: 1 Visit                     Jetta Lout PTA 01/12/23, 3:32 PM

## 2023-01-12 NOTE — Plan of Care (Signed)
Patient has been free of falls or injury. Patient VSS and denies SOB or respiratory complications. Patient continues to work with PT and is ambulating a short distance with the assistance of 2 people.  Patient denies pain or symptoms of infection. Nursing will continue to monitor towards progression of goals. Plan is for the patient to discharge on Tuesday with home health services.

## 2023-01-13 DIAGNOSIS — I5033 Acute on chronic diastolic (congestive) heart failure: Secondary | ICD-10-CM | POA: Diagnosis not present

## 2023-01-13 DIAGNOSIS — N189 Chronic kidney disease, unspecified: Secondary | ICD-10-CM

## 2023-01-13 DIAGNOSIS — J439 Emphysema, unspecified: Secondary | ICD-10-CM | POA: Diagnosis not present

## 2023-01-13 DIAGNOSIS — N179 Acute kidney failure, unspecified: Secondary | ICD-10-CM

## 2023-01-13 DIAGNOSIS — R531 Weakness: Secondary | ICD-10-CM | POA: Diagnosis not present

## 2023-01-13 DIAGNOSIS — J9602 Acute respiratory failure with hypercapnia: Secondary | ICD-10-CM | POA: Diagnosis not present

## 2023-01-13 DIAGNOSIS — J9601 Acute respiratory failure with hypoxia: Secondary | ICD-10-CM | POA: Diagnosis not present

## 2023-01-13 DIAGNOSIS — J9612 Chronic respiratory failure with hypercapnia: Secondary | ICD-10-CM | POA: Diagnosis not present

## 2023-01-13 DIAGNOSIS — E8770 Fluid overload, unspecified: Secondary | ICD-10-CM | POA: Diagnosis not present

## 2023-01-13 DIAGNOSIS — I9589 Other hypotension: Secondary | ICD-10-CM | POA: Diagnosis not present

## 2023-01-13 DIAGNOSIS — J441 Chronic obstructive pulmonary disease with (acute) exacerbation: Secondary | ICD-10-CM | POA: Diagnosis not present

## 2023-01-13 DIAGNOSIS — K529 Noninfective gastroenteritis and colitis, unspecified: Secondary | ICD-10-CM | POA: Diagnosis not present

## 2023-01-13 DIAGNOSIS — J9611 Chronic respiratory failure with hypoxia: Secondary | ICD-10-CM | POA: Diagnosis not present

## 2023-01-13 LAB — BASIC METABOLIC PANEL
Anion gap: 8 (ref 5–15)
BUN: 33 mg/dL — ABNORMAL HIGH (ref 8–23)
CO2: 33 mmol/L — ABNORMAL HIGH (ref 22–32)
Calcium: 8.3 mg/dL — ABNORMAL LOW (ref 8.9–10.3)
Chloride: 97 mmol/L — ABNORMAL LOW (ref 98–111)
Creatinine, Ser: 1.48 mg/dL — ABNORMAL HIGH (ref 0.61–1.24)
GFR, Estimated: 52 mL/min — ABNORMAL LOW (ref >60)
Glucose, Bld: 158 mg/dL — ABNORMAL HIGH (ref 70–99)
Potassium: 3.9 mmol/L (ref 3.5–5.1)
Sodium: 138 mmol/L (ref 135–145)

## 2023-01-13 NOTE — Plan of Care (Signed)

## 2023-01-13 NOTE — TOC Progression Note (Addendum)
Transition of Care [facility]) - Progression Note    Patient Details  Name: Gary [name] MRN: [id number] Date of Birth: [birth year]  Transition of Care [facility]) CM/SW Contact  Kemper Durie, RN Phone Number: 01/13/2023, 11:47 AM  Clinical Narrative:     Notified by Kandee Keen with [name] that they will not be able to accept patient for [facility] services.  Other agencies that are not able to accept: [facility], [facility], [facility], and [facility].  Messages left for [facility], will have to follow up tomorrow once the intake coordinator can review the chart.  Patient has been adamant about not leaving the hospital until all services are secure, state he does not want to end up back in the hospital. He is aware that we are still searching for [facility] agency to accept.  Patient state he is having trouble with his hospital bed, will not go up/down, and having issues with his oxygen humidity and suction equipment.  [facility] called, spoke with Selena Batten, service ticket has been placed.  Kim also confirmed that bariatric BSC will be delivered to the hospital today.     Update @ 1410:  [facility] will check to see if they can accept referral. TOC will continue to follow.  Expected Discharge Plan: Home w Home Health Services Barriers to Discharge: Continued Medical Work up  Expected Discharge Plan and Services     Post Acute Care Choice: Home Health                   DME Arranged: Bedside commode DME Agency: [facility] Date DME Agency Contacted: 01/12/23 Time DME Agency Contacted: 1427 Representative spoke with at DME Agency: [facility] Arranged: PT, OT HH Agency: [facility] Date [facility] Agency Contacted: 01/12/23 Time HH Agency Contacted: 1428 Representative spoke with at [facility] Agency: Kandee Keen   Social Determinants of Health (SDOH) Interventions SDOH Screenings   Food Insecurity: No Food Insecurity (12/01/2022)  Housing: Patient Declined (12/01/2022)  Transportation Needs: No Transportation Needs (12/01/2022)   Utilities: Not At Risk (12/01/2022)  Depression (PHQ2-9): Low Risk  (06/07/2021)  Financial Resource Strain: High Risk (11/21/2022)   Received from [facility]  Physical Activity: Insufficiently Active (05/03/2017)  Social Connections: Patient Declined (11/20/2022)   Received from [facility]  Stress: Stress Concern Present (11/20/2022)   Received from [facility]  Tobacco Use: Medium Risk (12/01/2022)    Readmission Risk Interventions    11/05/2022    2:59 PM 11/05/2022   12:16 PM 12/31/2021    8:58 AM  Readmission Risk Prevention Plan  Transportation Screening Complete Complete Complete  Medication Review Oceanographer) Complete Complete Complete  PCP or Specialist appointment within 3-5 days of discharge Complete Complete Complete  HRI or Home Care Consult Complete Complete Complete  SW Recovery Care/Counseling Consult Complete Complete Complete  Palliative Care Screening Complete Complete Complete  Skilled Nursing Facility Not Applicable Not Applicable Not Applicable

## 2023-01-13 NOTE — Progress Notes (Signed)
Mobility Specialist - Progress Note   01/13/23 1549  Mobility  Activity Transferred from chair to bed  Level of Assistance +2 (takes two people)  Assistive Device Other (Comment) Secondary school teacher)  Distance Ambulated (ft) 0 ft  Activity Response Tolerated well  Mobility Referral Yes  $Mobility charge 1 Mobility  Mobility Specialist Start Time (ACUTE ONLY) 1504  Mobility Specialist Stop Time (ACUTE ONLY) 1531  Mobility Specialist Time Calculation (min) (ACUTE ONLY) 27 min   Transfer from recliner to bed via hoyer lift +2.   [name]  Mobility Specialist  01/13/23 3:50 PM
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
## 2023-01-13 NOTE — Progress Notes (Signed)
Mobility Specialist - Progress Note   01/13/23 1300  Mobility  Activity Ambulated with assistance in hallway;Stood at bedside;Dangled on edge of bed  Level of Assistance Standby assist, set-up cues, supervision of patient - no hands on  Assistive Device Front wheel walker  Distance Ambulated (ft) 100 ft  Activity Response Tolerated well  Mobility Referral Yes  $Mobility charge 1 Mobility  Mobility Specialist Start Time (ACUTE ONLY) 1303  Mobility Specialist Stop Time (ACUTE ONLY) 1330  Mobility Specialist Time Calculation (min) (ACUTE ONLY) 27 min   Pt supine in bed on Trach 10 Liters. Pt completes bed mobility indep with extra time, assisted with donning shoes. Pt STS and ambulates in hallway SBA +2 for chair follow. Pt takes 1 standing rest break near the end of ambulation. Pt left in recliner with needs in reach.   [name]  Mobility Specialist  01/13/23 1:32 PM

## 2023-01-13 NOTE — Progress Notes (Signed)
Progress Note   Patient: Gary [name] ZOX:[id number] DOB: 1955-10-21 DOA: 11/28/2022     44 DOS: the patient was seen and examined on 01/13/2023   Brief hospital course: WILLY CHARRIER is a 67 y.o. 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   Mr. Madge stated that he ran out of his home torsemide several days ago due to a mixup with his pharmacy.  Then over the last couple days, he has noticed increasing lower extremity swelling, shortness of breath and chest pain.  He states that he usually develops chest pain when he has "fluid on his lungs."    Patient was initially diuresed with IV diuresis, responded well.  Significant improvement in peripheral edema.  IV Lasix was switched with home torsemide once creatinine started increasing.  Patient remained stable from respiratory standpoint point and remained on 6 to 8 L of supplemental oxygen via trach collar which is his baseline.  Patient developed Pseudomonas tracheitis, which was treated with Levaquin as he developed some pain around his tracheostomy.  Patient has end-stage COPD and need to continue follow-up with pulmonary as outpatient  9/25.  Patient needs a lot of assistance to get out of the bed.  Assessment and Plan: * Acute on chronic diastolic CHF (congestive heart failure) ([name]) I advised cutting back on the torsemide but patient wants to keep the torsemide going as is..  Continue spironolactone  Chronic respiratory failure with hypoxia and hypercapnia ([name]) Continue trach collar 10 L.  Continue ventilator at night.  COPD (chronic obstructive pulmonary disease) ([name]) Severe COPD on Combivent, as needed albuterol.  On yuperli nebulizer.  Acute kidney injury superimposed on CKD ([name]) Acute kidney  injury on CKD stage IIIa.  Creatinine up to 1.48 with diuresis.  (On 9/18 did have a creatinine of 1.17)  Hypotension Off midodrine  OSA (obstructive sleep apnea) Continue home ventilator at night  Chest pain ACS ruled out.  Chronic colitis - Continue home mesalamine  Generalized weakness Continue working with physical therapy and mobility specialist to work on getting out of the bed.  Patient able to ambulate once out of the bed.  Morbid obesity with BMI of 50.0-59.9, adult ([name]) BMI 51.73 with current height and weight in computer.        Subjective: Patient wants to make sure everything is set up prior to going home including home health a functioning bed, functioning suction and humidifier for his oxygen.  Patient's creatinine up to 1.48 and patient wants to stay on twice a day torsemide to remove fluid and does not want to cut back on that dose.  Physical Exam: Vitals:   01/12/23 2250 01/12/23 2313 01/13/23 0746 01/13/23 0749  BP: 131/79   101/69  Pulse: 87  80 79  Resp: 20  18 19   Temp: 99.4 F (37.4 C)   (!) 97.5 F (36.4 C)  TempSrc:      SpO2: 92% 94% 92% 91%  Weight:      Height:       Physical Exam HENT:     Head: Normocephalic.     Mouth/Throat:     Pharynx: No oropharyngeal exudate.  Eyes:     General: Lids are normal.     Conjunctiva/sclera: Conjunctivae normal.  Cardiovascular:     Rate and  Rhythm: Normal rate and regular rhythm.     Heart sounds: Normal heart sounds, S1 normal and S2 normal.  Pulmonary:     Breath sounds: Examination of the right-lower field reveals decreased breath sounds. Examination of the left-lower field reveals decreased breath sounds. Decreased breath sounds present. No wheezing, rhonchi or rales.  Abdominal:     Palpations: Abdomen is soft.     Tenderness: There is no abdominal tenderness.  Musculoskeletal:     Right lower leg: Swelling present.     Left lower leg: Swelling present.  Skin:    General: Skin is warm.      Findings: No rash.  Neurological:     Mental Status: He is alert and oriented to person, place, and time.     Data Reviewed: Creatinine 1.48  Disposition: Status is: Inpatient Remains inpatient appropriate because: Need to set up home health and make sure everything is functioning at home before disposition.  Patient needs to keep on working with physical therapy to work on getting out of the bed.  Planned Discharge Destination: Home with Home Health    Time spent: 27 minutes Case discussed with nursing staff and TOC.  Author: Alford Highland, MD 01/13/2023 1:48 PM  For on call review [email or web address].

## 2023-01-13 NOTE — Assessment & Plan Note (Addendum)
Acute kidney injury on CKD stage IIIa.  Creatinine 1.74 with diuresis.  (On 9/18 did have a creatinine of 1.17).  Creatinine will likely stay higher being on torsemide 40 mg twice a day.  Holding torsemide this evening with creatinine rise.  Check BMP tomorrow.

## 2023-01-14 DIAGNOSIS — J9612 Chronic respiratory failure with hypercapnia: Secondary | ICD-10-CM | POA: Diagnosis not present

## 2023-01-14 DIAGNOSIS — J441 Chronic obstructive pulmonary disease with (acute) exacerbation: Secondary | ICD-10-CM | POA: Diagnosis not present

## 2023-01-14 DIAGNOSIS — J9611 Chronic respiratory failure with hypoxia: Secondary | ICD-10-CM | POA: Diagnosis not present

## 2023-01-14 DIAGNOSIS — R531 Weakness: Secondary | ICD-10-CM | POA: Diagnosis not present

## 2023-01-14 DIAGNOSIS — E8770 Fluid overload, unspecified: Secondary | ICD-10-CM | POA: Diagnosis not present

## 2023-01-14 DIAGNOSIS — I5033 Acute on chronic diastolic (congestive) heart failure: Secondary | ICD-10-CM | POA: Diagnosis not present

## 2023-01-14 DIAGNOSIS — J439 Emphysema, unspecified: Secondary | ICD-10-CM | POA: Diagnosis not present

## 2023-01-14 DIAGNOSIS — K529 Noninfective gastroenteritis and colitis, unspecified: Secondary | ICD-10-CM | POA: Diagnosis not present

## 2023-01-14 DIAGNOSIS — I9589 Other hypotension: Secondary | ICD-10-CM | POA: Diagnosis not present

## 2023-01-14 DIAGNOSIS — Z6841 Body Mass Index (BMI) 40.0 and over, adult: Secondary | ICD-10-CM | POA: Diagnosis not present

## 2023-01-14 DIAGNOSIS — J9602 Acute respiratory failure with hypercapnia: Secondary | ICD-10-CM | POA: Diagnosis not present

## 2023-01-14 DIAGNOSIS — J9601 Acute respiratory failure with hypoxia: Secondary | ICD-10-CM | POA: Diagnosis not present

## 2023-01-14 LAB — BASIC METABOLIC PANEL
Anion gap: 8 (ref 5–15)
BUN: 33 mg/dL — ABNORMAL HIGH (ref 8–23)
CO2: 33 mmol/L — ABNORMAL HIGH (ref 22–32)
Calcium: 8.2 mg/dL — ABNORMAL LOW (ref 8.9–10.3)
Chloride: 98 mmol/L (ref 98–111)
Creatinine, Ser: 1.44 mg/dL — ABNORMAL HIGH (ref 0.61–1.24)
GFR, Estimated: 54 mL/min — ABNORMAL LOW (ref >60)
Glucose, Bld: 114 mg/dL — ABNORMAL HIGH (ref 70–99)
Potassium: 4 mmol/L (ref 3.5–5.1)
Sodium: 139 mmol/L (ref 135–145)

## 2023-01-14 NOTE — Progress Notes (Signed)
Occupational Therapy Treatment Patient Details Name: Gary [name] MRN: [id number] DOB: 01-Aug-1955 Today's Date: 01/14/2023   History of present illness 67 y/o male presented to [facility] ED on 11/28/22 for chest pain x 4 days. Admitted for acute on chronic CHF. Frequent admissions this year with most recent dsicharge on 11/21/22. PMH includes obesity, hypoxia, respiratory failure with tracheostomy, COPD, GIB, HFpEF.   OT comments  Gary [name] was seen for OT re-evaluation on this date. Upon arrival to room pt reclined in bed, agreeable to tx. Pt requires SBA + RW for functional mobility ~100 ft, +2 for lines mgmt. Pt directing session, agreeable to trial standing from chair next session. Goals remain appropriate, will continue to follow POC. Discharge recommendation remains appropriate.        If plan is discharge home, recommend the following:  A little help with walking and/or transfers;A lot of help with bathing/dressing/bathroom   Equipment Recommendations  None recommended by OT    Recommendations for Other Services      Precautions / Restrictions Precautions Precautions: Fall Precaution Comments: trach collar/ Trach Restrictions Weight Bearing Restrictions: No       Mobility Bed Mobility Overal bed mobility: Modified Independent             General bed mobility comments: HOB and rails use    Transfers Overall transfer level: Needs assistance Equipment used: Rolling walker (2 wheels) Transfers: Sit to/from Stand Sit to Stand: Supervision, From elevated surface           General transfer comment: standing from ~29"     Balance Overall balance assessment: Needs assistance Sitting-balance support: Feet supported, No upper extremity supported Sitting balance-[name] Scale: Good     Standing balance support: Bilateral upper extremity supported, Reliant on assistive device for balance Standing balance-[name] Scale: Fair                             ADL  either performed or assessed with clinical judgement   ADL Overall ADL's : Needs assistance/impaired                                       General ADL Comments: MAX A don B shoes seated EOB. SBA + RW for functional mobility ~100 ft., +2 for lines mgmt.     Extremity/Trunk Assessment Upper Extremity Assessment Upper Extremity Assessment: Overall WFL for tasks assessed   Lower Extremity Assessment Lower Extremity Assessment: Generalized weakness         Cognition Arousal: Alert Behavior During Therapy: WFL for tasks assessed/performed Overall Cognitive Status: Within Functional Limits for tasks assessed                                 General Comments: Pt directing session, agreeable to trial standing from chair next session                   Pertinent Vitals/ Pain       Pain Assessment Pain Assessment: No/denies pain   Frequency  Min 1X/week        Progress Toward Goals  OT Goals(current goals can now be found in the care plan section)  Progress towards OT goals: Progressing toward goals  Acute Rehab OT Goals Patient Stated Goal: go home OT Goal Formulation: With patient  Time For Goal Achievement: 01/28/23 Potential to Achieve Goals: Good ADL Goals Pt Will Perform Grooming: with modified independence;standing Pt Will Transfer to Toilet: with modified independence;ambulating;regular height toilet Pt Will Perform Toileting - Clothing Manipulation and hygiene: with mod assist;sit to/from stand  Plan Discharge plan remains appropriate;Frequency remains appropriate       AM-PAC OT "6 Clicks" Daily Activity     Outcome Measure   Help from another person eating meals?: None Help from another person taking care of personal grooming?: A Little Help from another person toileting, which includes using toliet, bedpan, or urinal?: A Lot Help from another person bathing (including washing, rinsing, drying)?: A Lot Help from another  person to put on and taking off regular upper body clothing?: None Help from another person to put on and taking off regular lower body clothing?: A Lot 6 Click Score: 17    End of Session Equipment Utilized During Treatment: Oxygen;Rolling walker (2 wheels)  OT Visit Diagnosis: Unsteadiness on feet (R26.81);Other abnormalities of gait and mobility (R26.89);Muscle weakness (generalized) (M62.81);Adult, failure to thrive (R62.7)   Activity Tolerance Patient tolerated treatment well   Patient Left in chair;with call bell/phone within reach   Nurse Communication Mobility status        Time: 1610-9604 OT Time Calculation (min): 20 min  Charges: OT General Charges $OT Visit: 1 Visit OT Evaluation $OT Re-eval: 1 Re-eval OT Treatments $Self Care/Home Management : 8-22 mins  Kathie Dike, M.S. OTR/L  01/14/23, 1:51 PM  ascom [phone]'

## 2023-01-14 NOTE — Progress Notes (Signed)
Mobility Specialist - Progress Note    01/14/23 1532  Mobility  Activity Transferred from chair to bed  Level of Assistance +2 (takes two people)  Assistive Device  Water quality scientist lift)  Activity Response Tolerated well  $Mobility charge 1 Mobility  Mobility Specialist Start Time (ACUTE ONLY) 1446  Mobility Specialist Stop Time (ACUTE ONLY) 1508  Mobility Specialist Time Calculation (min) (ACUTE ONLY) 22 min   Pt transferred chair to bed via Hoyer lift +2 for safety. Pt left in fowler position with needs within reach.  Zetta Bills Mobility Specialist 01/14/23 3:33 PM

## 2023-01-14 NOTE — Plan of Care (Signed)
  Problem: Education: Goal: Knowledge of General Education information will improve Description: Including pain rating scale, medication(s)/side effects and non-pharmacologic comfort measures Outcome: Progressing   Problem: Clinical Measurements: Goal: Ability to maintain clinical measurements within normal limits will improve Outcome: Progressing Goal: Respiratory complications will improve Outcome: Progressing   

## 2023-01-14 NOTE — Plan of Care (Signed)

## 2023-01-14 NOTE — Plan of Care (Addendum)
Resident bed unable to weigh patient dt needing to be zeroed. Resident refuses to transfer to zero bed. Unable to obtain wt.  Problem: Education: Goal: Knowledge of General Education information will improve Description: Including pain rating scale, medication(s)/side effects and non-pharmacologic comfort measures Outcome: Progressing   Problem: Clinical Measurements: Goal: Will remain free from infection Outcome: Progressing   Problem: Coping: Goal: Level of anxiety will decrease Outcome: Progressing   Problem: Safety: Goal: Ability to remain free from injury will improve Outcome: Progressing

## 2023-01-14 NOTE — TOC Progression Note (Signed)
Transition of Care [facility]) - Progression Note    Patient Details  Name: Gary [name] MRN: [id number] Date of Birth: 06/04/1955  [facility] CM/SW Contact  Marlowe Sax, RN Phone Number: 01/14/2023, 10:42 AM  Clinical Narrative:     Met with the patient at the bedside this morning, He tells me that he is making progress here at the hospital working with therapy, I explained that is great and he can continue working with our therapy team however he can not stay here to do so, he stated he would like to go to STR, I explained that we have been searching for a possible STR bed and the issue is most can not accept his weight and the Trach to triology or bipap, He stated understanding and stated that if he was going to go home he has to first talk to the [facility] agency that we find to ensure that are going to meet his needs, He stated that he is supposed to call the maintenance man to fix his bed this week, I explained he needed to call him today so that he can get it in place to get it fixed, he stated that he knows what he is doing, he stated that he felt we are rushing him out I explained that he has been here 47 days and that can not be considered as rushed out.  He stated that we have to make sure he can walk well enough to go home and have everything set up prior to him discharging and he will not agree with [facility] until he speaks to them himself I reached out to [facility] to see if he qualifies to go to [facility]   Expected Discharge Plan: Home w Home Health Services Barriers to Discharge: Continued Medical Work up  Expected Discharge Plan and Services     Post Acute Care Choice: Home Health                   DME Arranged: Bedside commode DME Agency: [employer] Date DME Agency Contacted: 01/12/23 Time DME Agency Contacted: 1427 Representative spoke with at DME Agency: [facility] HH Arranged: PT, OT HH Agency: [facility] Date [facility] Agency Contacted: 01/12/23 Time HH Agency  Contacted: 1428 Representative spoke with at [facility] Agency: Kandee Keen   Social Determinants of Health (SDOH) Interventions SDOH Screenings   Food Insecurity: No Food Insecurity (12/01/2022)  Housing: Patient Declined (12/01/2022)  Transportation Needs: No Transportation Needs (12/01/2022)  Utilities: Not At Risk (12/01/2022)  Depression (PHQ2-9): Low Risk  (06/07/2021)  Financial Resource Strain: High Risk (11/21/2022)   Received from [facility]  Physical Activity: Insufficiently Active (05/03/2017)  Social Connections: Patient Declined (11/20/2022)   Received from [facility]  Stress: Stress Concern Present (11/20/2022)   Received from [facility]  Tobacco Use: Medium Risk (12/01/2022)    Readmission Risk Interventions    11/05/2022    2:59 PM 11/05/2022   12:16 PM 12/31/2021    8:58 AM  Readmission Risk Prevention Plan  Transportation Screening Complete Complete Complete  Medication Review Oceanographer) Complete Complete Complete  PCP or Specialist appointment within 3-5 days of discharge Complete Complete Complete  HRI or Home Care Consult Complete Complete Complete  SW Recovery Care/Counseling Consult Complete Complete Complete  Palliative Care Screening Complete Complete Complete  Skilled Nursing Facility Not Applicable Not Applicable Not Applicable

## 2023-01-14 NOTE — Progress Notes (Signed)
Progress Note   Patient: Gary [name]:[id number] DOB: [birth year] DOA: 11/28/2022     45 DOS: the patient was seen and examined on 01/14/2023   Brief hospital course: Gary [name] is a 67 y.o. 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   Gary [name] stated that he ran out of his home torsemide several days ago due to a mixup with his pharmacy.  Then over the last couple days, he has noticed increasing lower extremity swelling, shortness of breath and chest pain.  He states that he usually develops chest pain when he has "fluid on his lungs."    Patient was initially diuresed with IV diuresis, responded well.  Significant improvement in peripheral edema.  IV Lasix was switched with home torsemide once creatinine started increasing.  Patient remained stable from respiratory standpoint point and remained on 6 to 8 L of supplemental oxygen via trach collar which is his baseline.  Patient developed Pseudomonas tracheitis, which was treated with Levaquin as he developed some pain around his tracheostomy.  Patient has end-stage COPD and need to continue follow-up with pulmonary as outpatient  9/25-9/30.  Patient needs a lot of assistance to get out of the bed.  Assessment and Plan: * Acute on chronic diastolic CHF (congestive heart failure) ([name]) Patient would like to continue twice a day torsemide 40 mg.  Continue spironolactone.  Chronic respiratory failure with hypoxia and hypercapnia ([name]) Continue trach collar 10 L.  Continue ventilator at night.  COPD (chronic obstructive pulmonary disease) ([name]) Severe COPD on Combivent, as needed albuterol.  On yuperli nebulizer.  Acute kidney injury superimposed on CKD ([name]) Acute kidney injury on CKD stage IIIa.   Creatinine 1.44 with diuresis.  (On 9/18 did have a creatinine of 1.17)  Hypotension Off midodrine  OSA (obstructive sleep apnea) Continue home ventilator at night  Chest pain ACS ruled out.  Chronic colitis Continue home mesalamine  Generalized weakness Continue working with physical therapy and mobility specialist to work on getting out of the bed.  Patient able to ambulate once out of the bed.  Morbid obesity with BMI of 50.0-59.9, adult ([name]) BMI 51.73 with current height and weight in computer.        Subjective: Patient states that his bed at home is not going up and down properly.  He will go out to rehab if we are able to obtain a rehab bed.  Wants to make sure before he goes home that he has everything that he needs in his bed is working properly.  Wants to stay on his water pill high dose to get rid of fluid.  Admitted 47 days ago with leg swelling and chest pain  Physical Exam: Vitals:   01/14/23 0630 01/14/23 0745 01/14/23 0806 01/14/23 1608  BP:   115/77   Pulse:  75 70 73  Resp:  18 18 18   Temp:   98.2 F (36.8 C)   TempSrc:      SpO2: 92% 95% 91% 93%  Weight:      Height:       Physical Exam HENT:     Head: Normocephalic.     Mouth/Throat:     Pharynx: No oropharyngeal exudate.  Eyes:     General: Lids are normal.     Conjunctiva/sclera:  Conjunctivae normal.  Cardiovascular:     Rate and Rhythm: Normal rate and regular rhythm.     Heart sounds: Normal heart sounds, S1 normal and S2 normal.  Pulmonary:     Breath sounds: Examination of the right-lower field reveals decreased breath sounds. Examination of the left-lower field reveals decreased breath sounds. Decreased breath sounds present. No wheezing, rhonchi or rales.  Abdominal:     Palpations: Abdomen is soft.     Tenderness: There is no abdominal tenderness.  Musculoskeletal:     Right lower leg: Swelling present.     Left lower leg: Swelling present.  Skin:    General: Skin is warm.      Findings: No rash.  Neurological:     Mental Status: He is alert and oriented to person, place, and time.     Data Reviewed: Creatinine 1.44  Disposition: Status is: Inpatient Remains inpatient appropriate because: We do not have a disposition plan at this time.  Patient states his bed is not going up and down properly.  He needs home health set up.  Planned Discharge Destination: Rehab if they are able to set up a rehab versus home with home health    Time spent: 27 minutes Spoke with care manager  Author: Alford Highland, MD 01/14/2023 4:11 PM  For on call review [email or web address].

## 2023-01-15 DIAGNOSIS — R531 Weakness: Secondary | ICD-10-CM | POA: Diagnosis not present

## 2023-01-15 DIAGNOSIS — J439 Emphysema, unspecified: Secondary | ICD-10-CM | POA: Diagnosis not present

## 2023-01-15 DIAGNOSIS — J9602 Acute respiratory failure with hypercapnia: Secondary | ICD-10-CM | POA: Diagnosis not present

## 2023-01-15 DIAGNOSIS — G4733 Obstructive sleep apnea (adult) (pediatric): Secondary | ICD-10-CM | POA: Diagnosis not present

## 2023-01-15 DIAGNOSIS — J9622 Acute and chronic respiratory failure with hypercapnia: Secondary | ICD-10-CM

## 2023-01-15 DIAGNOSIS — J441 Chronic obstructive pulmonary disease with (acute) exacerbation: Secondary | ICD-10-CM | POA: Diagnosis not present

## 2023-01-15 DIAGNOSIS — N179 Acute kidney failure, unspecified: Secondary | ICD-10-CM | POA: Diagnosis not present

## 2023-01-15 DIAGNOSIS — E8770 Fluid overload, unspecified: Secondary | ICD-10-CM | POA: Diagnosis not present

## 2023-01-15 DIAGNOSIS — K529 Noninfective gastroenteritis and colitis, unspecified: Secondary | ICD-10-CM | POA: Diagnosis not present

## 2023-01-15 DIAGNOSIS — J9601 Acute respiratory failure with hypoxia: Secondary | ICD-10-CM | POA: Diagnosis not present

## 2023-01-15 DIAGNOSIS — J9621 Acute and chronic respiratory failure with hypoxia: Secondary | ICD-10-CM | POA: Diagnosis not present

## 2023-01-15 DIAGNOSIS — I5033 Acute on chronic diastolic (congestive) heart failure: Secondary | ICD-10-CM | POA: Diagnosis not present

## 2023-01-15 DIAGNOSIS — I9589 Other hypotension: Secondary | ICD-10-CM | POA: Diagnosis not present

## 2023-01-15 LAB — CBC
HCT: 39.2 % (ref 39.0–52.0)
Hemoglobin: 10.3 g/dL — ABNORMAL LOW (ref 13.0–17.0)
MCH: 20.8 pg — ABNORMAL LOW (ref 26.0–34.0)
MCHC: 26.3 g/dL — ABNORMAL LOW (ref 30.0–36.0)
MCV: 79.2 fL — ABNORMAL LOW (ref 80.0–100.0)
Platelets: 221 10*3/uL (ref 150–400)
RBC: 4.95 MIL/uL (ref 4.22–5.81)
RDW: 20.3 % — ABNORMAL HIGH (ref 11.5–15.5)
WBC: 7.7 10*3/uL (ref 4.0–10.5)
nRBC: 0.9 % — ABNORMAL HIGH (ref 0.0–0.2)

## 2023-01-15 MED ORDER — FUROSEMIDE 10 MG/ML IJ SOLN
40.0000 mg | Freq: Two times a day (BID) | INTRAMUSCULAR | Status: DC
  Administered 2023-01-15 – 2023-01-23 (×16): 40 mg via INTRAVENOUS
  Filled 2023-01-15 (×19): qty 4

## 2023-01-15 NOTE — TOC Progression Note (Signed)
Transition of Care [facility]) - Progression Note    Patient Details  Name: Gary [name] MRN: [id number] Date of Birth: Feb 01, 1956  Transition of Care [facility]) CM/SW Contact  Marlowe Sax, RN Phone Number: 01/15/2023, 1:49 PM  Clinical Narrative:    [facility] not [facility] to manage the patient on 10 liters of oxygen as well as can not accommodate his weight for short term rehab.  Reaching out to see if can qualify for LTAC   Expected Discharge Plan: Home w Home Health Services Barriers to Discharge: Continued Medical Work up  Expected Discharge Plan and Services     Post Acute Care Choice: Home Health                   DME Arranged: Bedside commode DME Agency: [employer] Date DME Agency Contacted: 01/12/23 Time DME Agency Contacted: 1427 Representative spoke with at DME Agency: [facility] HH Arranged: PT, OT HH Agency: [facility] Date [facility] Agency Contacted: 01/12/23 Time HH Agency Contacted: 1428 Representative spoke with at [facility] Agency: Kandee Keen   Social Determinants of Health (SDOH) Interventions SDOH Screenings   Food Insecurity: No Food Insecurity (12/01/2022)  Housing: Patient Declined (12/01/2022)  Transportation Needs: No Transportation Needs (12/01/2022)  Utilities: Not At Risk (12/01/2022)  Depression (PHQ2-9): Low Risk  (06/07/2021)  Financial Resource Strain: High Risk (11/21/2022)   Received from [facility]  Physical Activity: Insufficiently Active (05/03/2017)  Social Connections: Patient Declined (11/20/2022)   Received from [facility]  Stress: Stress Concern Present (11/20/2022)   Received from [facility]  Tobacco Use: Medium Risk (12/01/2022)    Readmission Risk Interventions    11/05/2022    2:59 PM 11/05/2022   12:16 PM 12/31/2021    8:58 AM  Readmission Risk Prevention Plan  Transportation Screening Complete Complete Complete  Medication Review Oceanographer) Complete Complete Complete  PCP or  Specialist appointment within 3-5 days of discharge Complete Complete Complete  HRI or Home Care Consult Complete Complete Complete  SW Recovery Care/Counseling Consult Complete Complete Complete  Palliative Care Screening Complete Complete Complete  Skilled Nursing Facility Not Applicable Not Applicable Not Applicable

## 2023-01-15 NOTE — Plan of Care (Signed)
  Problem: Education: Goal: Knowledge of General Education information will improve Description: Including pain rating scale, medication(s)/side effects and non-pharmacologic comfort measures Outcome: Progressing   Problem: Clinical Measurements: Goal: Will remain free from infection Outcome: Progressing   Problem: Coping: Goal: Level of anxiety will decrease Outcome: Progressing   

## 2023-01-15 NOTE — Progress Notes (Signed)
Progress Note   Patient: Gary [name] ZOX:[id number] DOB: 1956/02/20 DOA: 11/28/2022     46 DOS: the patient was seen and examined on 01/15/2023   Brief hospital course: Gary [name] is a 67 y.o. 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   Gary [name] stated that he ran out of his home torsemide several days ago due to a mixup with his pharmacy.  Then over the last couple days, he has noticed increasing lower extremity swelling, shortness of breath and chest pain.  He states that he usually develops chest pain when he has "fluid on his lungs."    Patient was initially diuresed with IV diuresis, responded well.  Significant improvement in peripheral edema.  IV Lasix was switched with home torsemide once creatinine started increasing.  Patient remained stable from respiratory standpoint point and remained on 6 to 8 L of supplemental oxygen via trach collar which is his baseline.  Patient developed Pseudomonas tracheitis, which was treated with Levaquin as he developed some pain around his tracheostomy.  Patient has end-stage COPD and need to continue follow-up with pulmonary as outpatient  9/25-10/1.  Patient needs a lot of assistance to get out of the bed.  Assessment and Plan: * Acute on chronic diastolic CHF (congestive heart failure) ([name]) Will switch torsemide 40 mg twice a day to Lasix 40 mg IV twice daily to see if we can mobilize more fluid.  Continue spironolactone.  Acute on chronic respiratory failure with hypoxia and hypercapnia ([name]) Continue trach collar 10 L.  Continue ventilator at night.  Patient will need prolonged weaning secondary to severe COPD with hypercapnic hypoxic respiratory failure and sleep apnea.  Hopefully he can wean back down to 6  to 8 L on the trach collar.  COPD (chronic obstructive pulmonary disease) ([name]) Severe COPD on Combivent, as needed albuterol.  On yuperli nebulizer.  Acute kidney injury superimposed on CKD ([name]) Acute kidney injury on CKD stage IIIa.  Creatinine 1.44 with diuresis.  (On 9/18 did have a creatinine of 1.17).  Monitor with IV diuresis.  Hypotension Off midodrine  OSA (obstructive sleep apnea) Continue home ventilator at night  Chest pain ACS ruled out.  Chronic colitis Continue home mesalamine  Generalized weakness Continue working with physical therapy and mobility specialist to work on getting out of the bed.  Patient able to ambulate once out of the bed.  Morbid obesity with BMI of 50.0-59.9, adult ([name]) BMI 51.73 with current height and weight in computer.        Subjective: Patient feels weak and short of breath with doing things.  Having trouble getting out of the bed.  Patient requested Dr. Excell Seltzer podiatry to cut his toenails in the hospital by Dr. Excell Seltzer wanted to see him as outpatient to do this.  Physical Exam: Vitals:   01/15/23 0042 01/15/23 0732 01/15/23 0812 01/15/23 1520  BP:   112/66 122/77  Pulse:   83 85  Resp:   18 20  Temp:   97.8 F (36.6 C) 97.9 F (36.6 C)  TempSrc:      SpO2: 95% 93% 92% (!) 84%  Weight:      Height:       Physical Exam HENT:     Head: Normocephalic.     Mouth/Throat:  Pharynx: No oropharyngeal exudate.  Eyes:     General: Lids are normal.     Conjunctiva/sclera: Conjunctivae normal.  Cardiovascular:     Rate and Rhythm: Normal rate and regular rhythm.     Heart sounds: Normal heart sounds, S1 normal and S2 normal.  Pulmonary:     Breath sounds: Examination of the right-lower field reveals decreased breath sounds. Examination of the left-lower field reveals decreased breath sounds. Decreased breath sounds present. No wheezing, rhonchi or rales.  Abdominal:     Palpations: Abdomen is soft.     Tenderness: There is  no abdominal tenderness.  Musculoskeletal:     Right lower leg: Swelling present.     Left lower leg: Swelling present.  Skin:    General: Skin is warm.     Findings: No rash.     Comments: Fungus toenails.  Neurological:     Mental Status: He is alert and oriented to person, place, and time.     Data Reviewed: Hemoglobin 10.3  Disposition: Status is: Inpatient Remains inpatient appropriate because: Looking into LTAC facility  Planned Discharge Destination: Potentially LTAC    Time spent: 28 minutes Case discussed with pulmonary and TOC  Author: Alford Highland, MD 01/15/2023 3:43 PM  For on call review [email or web address].

## 2023-01-15 NOTE — Plan of Care (Signed)
  Problem: Education: Goal: Knowledge of General Education information will improve Description: Including pain rating scale, medication(s)/side effects and non-pharmacologic comfort measures Outcome: Progressing   Problem: Activity: Goal: Risk for activity intolerance will decrease Outcome: Progressing   Problem: Nutrition: Goal: Adequate nutrition will be maintained Outcome: Progressing   Problem: Pain Managment: Goal: General experience of comfort will improve Outcome: Progressing   Problem: Safety: Goal: Ability to remain free from injury will improve Outcome: Progressing   
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
## 2023-01-15 NOTE — Assessment & Plan Note (Addendum)
This morning was on 2 L.  Continue ventilator at night.  Patient will need prolonged weaning secondary to severe COPD with hypercapnic hypoxic respiratory failure and sleep apnea.  Earlier in the hospital stay was on much more oxygen.
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
## 2023-01-16 DIAGNOSIS — E8770 Fluid overload, unspecified: Secondary | ICD-10-CM | POA: Diagnosis not present

## 2023-01-16 DIAGNOSIS — J9601 Acute respiratory failure with hypoxia: Secondary | ICD-10-CM | POA: Diagnosis not present

## 2023-01-16 DIAGNOSIS — J9602 Acute respiratory failure with hypercapnia: Secondary | ICD-10-CM | POA: Diagnosis not present

## 2023-01-16 DIAGNOSIS — J441 Chronic obstructive pulmonary disease with (acute) exacerbation: Secondary | ICD-10-CM | POA: Diagnosis not present

## 2023-01-16 DIAGNOSIS — I5033 Acute on chronic diastolic (congestive) heart failure: Secondary | ICD-10-CM | POA: Diagnosis not present

## 2023-01-16 LAB — BASIC METABOLIC PANEL
Anion gap: 5 (ref 5–15)
BUN: 40 mg/dL — ABNORMAL HIGH (ref 8–23)
CO2: 32 mmol/L (ref 22–32)
Calcium: 8.1 mg/dL — ABNORMAL LOW (ref 8.9–10.3)
Chloride: 97 mmol/L — ABNORMAL LOW (ref 98–111)
Creatinine, Ser: 1.54 mg/dL — ABNORMAL HIGH (ref 0.61–1.24)
GFR, Estimated: 50 mL/min — ABNORMAL LOW (ref >60)
Glucose, Bld: 147 mg/dL — ABNORMAL HIGH (ref 70–99)
Potassium: 3.9 mmol/L (ref 3.5–5.1)
Sodium: 134 mmol/L — ABNORMAL LOW (ref 135–145)

## 2023-01-16 NOTE — Progress Notes (Signed)
Occupational Therapy Treatment Patient Details Name: Gary [name] MRN: [id number] DOB: 09/25/1955 Today's Date: 01/16/2023   History of present illness 67 y/o male presented to [facility] ED on 11/28/22 for chest pain x 4 days. Admitted for acute on chronic CHF. Frequent admissions this year with most recent dsicharge on 11/21/22. PMH includes obesity, hypoxia, respiratory failure with tracheostomy, COPD, GIB, HFpEF.   OT comments  Gary [name] was seen for OT treatment on this date. Upon arrival to room pt reclined in bed, agreeable to tx. Pt requires no assist for bed mobility. MAX A don B shoes seated EOB. Episode of incontinence, MAX A pericare standing. Standing from 28" bed height. Pt making good progress toward goals, will continue to follow POC. Discharge recommendation updated to [facility] with consideration for transition to LTC if patient unable to improve/family unable to support post-LTACH stay.      If plan is discharge home, recommend the following:  A little help with walking and/or transfers;A lot of help with bathing/dressing/bathroom   Equipment Recommendations  None recommended by OT    Recommendations for Other Services      Precautions / Restrictions Precautions Precautions: Fall Precaution Comments: trach collar/ Trach Restrictions Weight Bearing Restrictions: No       Mobility Bed Mobility Overal bed mobility: Modified Independent                  Transfers Overall transfer level: Needs assistance Equipment used: Rolling walker (2 wheels) Transfers: Sit to/from Stand Sit to Stand: Supervision, From elevated surface           General transfer comment: standing from 28" height x1 and 30" height x2     Balance Overall balance assessment: Needs assistance Sitting-balance support: Feet supported, No upper extremity supported Sitting balance-[name] Scale: Good     Standing balance support: Bilateral upper extremity supported, Reliant on assistive device  for balance Standing balance-[name] Scale: Fair                             ADL either performed or assessed with clinical judgement   ADL Overall ADL's : Needs assistance/impaired                                       General ADL Comments: MAX A don B shoes seated EOB. MAX A pericare standing      Cognition Arousal: Alert Behavior During Therapy: WFL for tasks assessed/performed Overall Cognitive Status: Within Functional Limits for tasks assessed                                                     Pertinent Vitals/ Pain       Pain Assessment Pain Assessment: No/denies pain   Frequency  Min 1X/week        Progress Toward Goals  OT Goals(current goals can now be found in the care plan section)  Progress towards OT goals: Progressing toward goals  Acute Rehab OT Goals Patient Stated Goal: to go home OT Goal Formulation: With patient Time For Goal Achievement: 01/28/23 Potential to Achieve Goals: Good ADL Goals Pt Will Perform Grooming: with modified independence;standing Pt Will Transfer to Toilet: with modified independence;ambulating;regular height toilet  Pt Will Perform Toileting - Clothing Manipulation and hygiene: with mod assist;sit to/from stand  Plan Discharge plan remains appropriate;Frequency remains appropriate    Co-evaluation                 AM-PAC OT "6 Clicks" Daily Activity     Outcome Measure   Help from another person eating meals?: None Help from another person taking care of personal grooming?: A Little Help from another person toileting, which includes using toliet, bedpan, or urinal?: A Lot Help from another person bathing (including washing, rinsing, drying)?: A Lot Help from another person to put on and taking off regular upper body clothing?: None Help from another person to put on and taking off regular lower body clothing?: A Lot 6 Click Score: 17    End of Session    OT  Visit Diagnosis: Unsteadiness on feet (R26.81);Other abnormalities of gait and mobility (R26.89);Muscle weakness (generalized) (M62.81);Adult, failure to thrive (R62.7)   Activity Tolerance Patient tolerated treatment well   Patient Left in bed;with call bell/phone within reach;with nursing/sitter in room   Nurse Communication          Time: 3329-5188 OT Time Calculation (min): 27 min  Charges: OT General Charges $OT Visit: 1 Visit OT Treatments $Self Care/Home Management : 23-37 mins  Kathie Dike, M.S. OTR/L  01/16/23, 3:56 PM  ascom [phone]

## 2023-01-16 NOTE — TOC Progression Note (Signed)
Transition of Care [facility]) - Progression Note    Patient Details  Name: Gary [name] MRN: [id number] Date of Birth: 05-02-1955  Transition of Care [facility] CM/SW Contact  Marlowe Sax, RN Phone Number: 01/16/2023, 12:33 PM  Clinical Narrative:    The patient is awaiting Ins approval to go to [facility], Ins process was started yesterday Peer to peer may be needed   Expected Discharge Plan: Home w Home Health Services Barriers to Discharge: Continued Medical Work up  Expected Discharge Plan and Services     Post Acute Care Choice: Home Health                   DME Arranged: Bedside commode DME Agency: [employer] Date DME Agency Contacted: 01/12/23 Time DME Agency Contacted: 1427 Representative spoke with at DME Agency: [facility] Arranged: PT, OT HH Agency: [facility] Date [facility] Agency Contacted: 01/12/23 Time HH Agency Contacted: 1428 Representative spoke with at [facility] Agency: Kandee Keen   Social Determinants of Health (SDOH) Interventions SDOH Screenings   Food Insecurity: No Food Insecurity (12/01/2022)  Housing: Patient Declined (12/01/2022)  Transportation Needs: No Transportation Needs (12/01/2022)  Utilities: Not At Risk (12/01/2022)  Depression (PHQ2-9): Low Risk  (06/07/2021)  Financial Resource Strain: High Risk (11/21/2022)   Received from [facility] Care  Physical Activity: Insufficiently Active (05/03/2017)  Social Connections: Patient Declined (11/20/2022)   Received from [facility]  Stress: Stress Concern Present (11/20/2022)   Received from [facility]  Tobacco Use: Medium Risk (12/01/2022)    Readmission Risk Interventions    11/05/2022    2:59 PM 11/05/2022   12:16 PM 12/31/2021    8:58 AM  Readmission Risk Prevention Plan  Transportation Screening Complete Complete Complete  Medication Review Oceanographer) Complete Complete Complete  PCP or Specialist appointment within 3-5 days of discharge Complete Complete Complete  HRI or Home Care  Consult Complete Complete Complete  SW Recovery Care/Counseling Consult Complete Complete Complete  Palliative Care Screening Complete Complete Complete  Skilled Nursing Facility Not Applicable Not Applicable Not Applicable

## 2023-01-16 NOTE — Progress Notes (Signed)
This RN took over patient care midshift.  Patient refusing to turn to allow measurement of buttocks wound.  Info passed to oncoming shift to attempt measurement.

## 2023-01-16 NOTE — Progress Notes (Signed)
PT Cancellation Note  Patient Details Name: Gary [name] MRN: [id number] DOB: Sep 08, 1955   Cancelled Treatment:    Reason Eval/Treat Not Completed: Patient declined, no reason specified Pt reports he worked with OT earlier this afternoon and is "Done for the day."  Will maintain on caseload and continue to see as appropriate.  [name], DPT 01/16/2023, 5:26 PM

## 2023-01-16 NOTE — Progress Notes (Addendum)
PROGRESS NOTE    Gary [name]   Gary [name] DOB: 01-17-1956  DOA: 11/28/2022 Date of Service: 01/16/23 which is hospital day 47  PCP: Shayne Alken, MD    Gary [name] is a 67 y.o. 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   Gary [name] stated that he ran out of his home torsemide several days ago due to a mixup with his pharmacy.  Then over the last couple days, he has noticed increasing lower extremity swelling, shortness of breath and chest pain.  He states that he usually develops chest pain when he has "fluid on his lungs."    Patient was initially diuresed with IV diuresis, responded well.  Significant improvement in peripheral edema.  IV Lasix was switched with home torsemide once creatinine started increasing.  Patient remained stable from respiratory standpoint point and remained on 6 to 8 L of supplemental oxygen via trach collar which is his baseline.  Patient developed Pseudomonas tracheitis, which was treated with Levaquin as he developed some pain around his tracheostomy.  Patient has end-stage COPD and need to continue follow-up with pulmonary as outpatient  9/25-10/2.  Patient needs a lot of assistance to get out of the bed.       ASSESSMENT & PLAN:   Acute on chronic diastolic CHF (congestive heart failure) ([name]) Will switch torsemide 40 mg twice a day to Lasix 40 mg IV twice daily to see if we can mobilize more fluid.  Continue spironolactone.   Acute on chronic respiratory failure with hypoxia and hypercapnia ([name]) Continue trach collar 10 L.  Continue ventilator at night.  Patient will need prolonged weaning secondary to severe COPD with hypercapnic hypoxic respiratory failure and sleep apnea.  Hopefully he can wean  back down to 6 to 8 L on the trach collar but have not been able to wean down to 8L remains at 10L   COPD (chronic obstructive pulmonary disease) ([name]) Severe COPD on Combivent, as needed albuterol.  On yuperli nebulizer. Previous plan was to go home w/ home health but current needs are LTAC for 10L O2 and ongoing physical therapy. He lives home alone. Anticipate additional therapy will allow the patient the opportunity to go back home after LTAC.    Acute kidney injury superimposed on CKD ([name]) Acute kidney injury on CKD stage IIIa.  Creatinine 1.44 with diuresis.  (On 9/18 did have a creatinine of 1.17).  Monitor with IV diuresis.   Hypotension Off midodrine   OSA (obstructive sleep apnea) Continue home ventilator at night   Chest pain ACS ruled out.   Chronic colitis Continue home mesalamine   Generalized weakness Continue working with physical therapy and mobility specialist to work on getting out of the bed.  Patient able to ambulate once out of the bed. Previous plan was to go home w/ home health but current needs are LTAC for 10L O2 and ongoing physical therapy. He lives home alone. Anticipate additional therapy will allow the patient the opportunity to go back home after LTAC.    Morbid obesity with BMI of 50.0-59.9, adult ([name]) BMI 51.73 with current height and weight in computer.        Morbid obesity based on BMI: Body mass index is 51.73 kg/m.   DVT prophylaxis: lovenox  IV  fluids: no continuous IV fluids  Nutrition: cardiac diet w/ fluid restriction Central lines / invasive devices: none  Code Status: FULL CODE ACP documentation reviewed: 01/16/23 and none on file in [facility]  TOC needs: placement Barriers to dispo / significant pending items: placement              Subjective / Brief ROS:  Patient reports no concerns today Denies CP/SOB.  Pain controlled.  Denies new weakness.  Tolerating diet.  Reports no concerns w/ urination/defecation.    Family Communication: none at this time     Objective Findings:  Vitals:   01/15/23 1520 01/15/23 2104 01/16/23 0023 01/16/23 0905  BP: 122/77  120/79 103/64  Pulse: 85  86 83  Resp: 20  20 20   Temp: 97.9 F (36.6 C)  98.2 F (36.8 C) 97.7 F (36.5 C)  TempSrc:      SpO2: 92% 91% 91% 92%  Weight:      Height:        Intake/Output Summary (Last 24 hours) at 01/16/2023 1630 Last data filed at 01/16/2023 1400 Gross per 24 hour  Intake 960 ml  Output 1600 ml  Net -640 ml   Filed Weights   01/10/23 0500  Weight: (!) 192.8 kg    Examination:  Physical Exam Constitutional:      General: He is not in acute distress.    Appearance: He is obese.  Cardiovascular:     Rate and Rhythm: Normal rate and regular rhythm.     Heart sounds: Normal heart sounds.  Pulmonary:     Effort: Pulmonary effort is normal. No accessory muscle usage.  Neurological:     General: No focal deficit present.     Mental Status: He is alert.  Psychiatric:        Mood and Affect: Mood normal.        Behavior: Behavior normal.          Scheduled Medications:   acidophilus  2 capsule Oral BID   arformoterol  15 mcg Nebulization BID   bacitracin   Topical Daily   docusate sodium  100 mg Oral Daily   enoxaparin (LOVENOX) injection  100 mg Subcutaneous Q24H   ferrous sulfate  325 mg Oral Q breakfast   furosemide  40 mg Intravenous BID   Gerhardt's butt cream   Topical Daily   guaiFENesin  1,200 mg Oral BID   Ipratropium-Albuterol  1 puff Inhalation Q6H   Mesalamine  800 mg Oral BID   mouth rinse  15 mL Mouth Rinse 4 times per day   revefenacin  175 mcg Nebulization Daily   spironolactone  25 mg Oral Daily    Continuous Infusions:   PRN Medications:  acetaminophen, albuterol, hydrOXYzine, ondansetron (ZOFRAN) IV, mouth rinse, trolamine salicylate  Antimicrobials from admission:  Anti-infectives (From admission, onward)    Start     Dose/Rate Route Frequency Ordered Stop    01/02/23 0900  sulfamethoxazole-trimethoprim (BACTRIM) 400-80 MG per tablet 1 tablet  Status:  Discontinued        1 tablet Oral Once per day on Monday Wednesday Friday 01/01/23 1207 01/12/23 1002   12/03/22 1615  levofloxacin (LEVAQUIN) tablet 750 mg        750 mg Oral Daily 12/03/22 1520 12/07/22 0831           Data Reviewed:  I have personally reviewed the following...  CBC: Recent Labs  Lab 01/15/23 0842  WBC 7.7  HGB 10.3*  HCT 39.2  MCV 79.2*  PLT 221   Basic Metabolic Panel: Recent Labs  Lab 01/10/23 0801 01/13/23 0823 01/14/23 0754 01/16/23 0818  NA 138 138 139 134*  K 4.1 3.9 4.0 3.9  CL 96* 97* 98 97*  CO2 32 33* 33* 32  GLUCOSE 101* 158* 114* 147*  BUN 30* 33* 33* 40*  CREATININE 1.38* 1.48* 1.44* 1.54*  CALCIUM 8.4* 8.3* 8.2* 8.1*   GFR: Estimated Creatinine Clearance: 87.4 mL/min (A) (by C-G formula based on SCr of 1.54 mg/dL (H)). Liver Function Tests: No results for input(s): "AST", "ALT", "ALKPHOS", "BILITOT", "PROT", "ALBUMIN" in the last 168 hours. No results for input(s): "LIPASE", "AMYLASE" in the last 168 hours. No results for input(s): "AMMONIA" in the last 168 hours. Coagulation Profile: No results for input(s): "INR", "PROTIME" in the last 168 hours. Cardiac Enzymes: No results for input(s): "CKTOTAL", "CKMB", "CKMBINDEX", "TROPONINI" in the last 168 hours. BNP (last 3 results) No results for input(s): "PROBNP" in the last 8760 hours. HbA1C: No results for input(s): "HGBA1C" in the last 72 hours. CBG: No results for input(s): "GLUCAP" in the last 168 hours. Lipid Profile: No results for input(s): "CHOL", "HDL", "LDLCALC", "TRIG", "CHOLHDL", "LDLDIRECT" in the last 72 hours. Thyroid Function Tests: No results for input(s): "TSH", "T4TOTAL", "FREET4", "T3FREE", "THYROIDAB" in the last 72 hours. Anemia Panel: No results for input(s): "VITAMINB12", "FOLATE", "FERRITIN", "TIBC", "IRON", "RETICCTPCT" in the last 72 hours. Most Recent  Urinalysis On File:     Component Value Date/Time   COLORURINE YELLOW 11/07/2022 2151   APPEARANCEUR CLEAR 11/07/2022 2151   APPEARANCEUR Clear 04/15/2014 1240   LABSPEC 1.013 11/07/2022 2151   LABSPEC 1.013 04/15/2014 1240   PHURINE 5.0 11/07/2022 2151   GLUCOSEU NEGATIVE 11/07/2022 2151   GLUCOSEU Negative 04/15/2014 1240   HGBUR NEGATIVE 11/07/2022 2151   BILIRUBINUR NEGATIVE 11/07/2022 2151   BILIRUBINUR Negative 04/15/2014 1240   KETONESUR NEGATIVE 11/07/2022 2151   PROTEINUR NEGATIVE 11/07/2022 2151   NITRITE NEGATIVE 11/07/2022 2151   LEUKOCYTESUR NEGATIVE 11/07/2022 2151   LEUKOCYTESUR Negative 04/15/2014 1240   Sepsis Labs: @LABRCNTIP (procalcitonin:4,lacticidven:4) Microbiology: No results found for this or any previous visit (from the past 240 hour(s)).    Radiology Studies last 3 days: No results found.     Time spent: 25 min    Sunnie Nielsen, DO [facility] 01/16/2023, 4:30 PM    Dictation software may have been used to generate the above note. Typos may occur and escape review in typed/dictated notes. Please contact Dr Lyn Hollingshead directly for clarity if needed.  Staff may message me via secure chat in Epic  but this may not receive an immediate response,  please page me for urgent matters!  If 7PM-7AM, please contact night coverage [email or web address]
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
## 2023-01-17 DIAGNOSIS — J9602 Acute respiratory failure with hypercapnia: Secondary | ICD-10-CM | POA: Diagnosis not present

## 2023-01-17 DIAGNOSIS — J441 Chronic obstructive pulmonary disease with (acute) exacerbation: Secondary | ICD-10-CM | POA: Diagnosis not present

## 2023-01-17 DIAGNOSIS — J9601 Acute respiratory failure with hypoxia: Secondary | ICD-10-CM | POA: Diagnosis not present

## 2023-01-17 DIAGNOSIS — I5033 Acute on chronic diastolic (congestive) heart failure: Secondary | ICD-10-CM | POA: Diagnosis not present

## 2023-01-17 DIAGNOSIS — E8770 Fluid overload, unspecified: Secondary | ICD-10-CM | POA: Diagnosis not present

## 2023-01-17 NOTE — Plan of Care (Signed)
  Problem: Health Behavior/Discharge Planning: Goal: Ability to manage health-related needs will improve Outcome: Progressing   

## 2023-01-17 NOTE — Progress Notes (Signed)
Mobility Specialist - Progress Note   01/17/23 1507  Mobility  Activity Transferred from chair to bed  Level of Assistance +2 (takes two people)  Assistive Device  Water quality scientist lift)  Activity Response Tolerated well  $Mobility charge 1 Mobility  Mobility Specialist Start Time (ACUTE ONLY) 1444  Mobility Specialist Stop Time (ACUTE ONLY) 1500  Mobility Specialist Time Calculation (min) (ACUTE ONLY) 16 min   Pt sitting in the recliner upon entry, utilizing RA. Pt transferred to bed via hoyer lift +2 for safety, left in fowler position with needs within reach.  Zetta Bills Mobility Specialist 01/17/23 3:12 PM

## 2023-01-17 NOTE — Plan of Care (Signed)

## 2023-01-17 NOTE — Plan of Care (Signed)

## 2023-01-17 NOTE — Progress Notes (Cosign Needed)
Physical Therapy Treatment Patient Details Name: Gary [name] MRN: [id number] DOB: 11/05/1955 Today's Date: 01/17/2023   History of Present Illness 67 y/o male presented to [facility] ED on 11/28/22 for chest pain x 4 days. Admitted for acute on chronic CHF. Frequent admissions this year with most recent dsicharge on 11/21/22. PMH includes obesity, hypoxia, respiratory failure with tracheostomy, COPD, GIB, HFpEF.    PT Comments  Pt was long sitting in bed upon arrival. He is more lethargic today than previous sessions observed by Thereasa Parkin." I've been having a tough day breathing but I'm ready to do it." Pt was on 10 L trach collar throughout session. Pt likes to dictate session progression and remains unwilling to attempt task author request of him. Pt presents with some anxiety about mobility, transfers, and gait. He is able to stand with supervision, however bed height elevated to 33 " due to pt unwillingness to attempt lower.  Pt did ambulate into hallway ~ 28ft prior to needing a seated rest due to fatigue. Pt did desaturate to 86% on 10 L but recovers without need to increase O2 supply. Sao2 at conclusion of session was 91%. Pt was seated in recliner post session with all needs in reach. DC recs update to reflect post acute needs. Pt does not have the available assistance at DC to support his care needs. Acute PT will continue to follow and progress per current POC.    If plan is discharge home, recommend the following: A lot of help with bathing/dressing/bathroom;Assist for transportation;A lot of help with walking and/or transfers;A little help with walking and/or transfers (A little help ambulating but alot (+2) of assistance to stand from standard heights)     Equipment Recommendations  Other (comment) (defer to next level of care)       Precautions / Restrictions Precautions Precautions: Fall Precaution Comments: trach collar/ Trach (10 L o2 trach collar) Restrictions Weight Bearing  Restrictions: No RLE Weight Bearing: Weight bearing as tolerated     Mobility  Bed Mobility Overal bed mobility: Modified Independent Bed Mobility: Supine to Sit  Supine to sit: HOB elevated, Used rails, Supervision  General bed mobility comments: Pt did not require phyical assistance to achieve EOB sitting.    Transfers Overall transfer level: Needs assistance Equipment used: Rolling walker (2 wheels) (Bariatric) Transfers: Sit to/from Stand Sit to Stand: Supervision, From elevated surface  General transfer comment: Thereasa Parkin continues to encourage pt to at least attempt standing from a lower surface height. Pt unwilling and constantly repeats " raise the bed up, raise the bed up," Pt stood with supervision from 33 " surface height    Ambulation/Gait Ambulation/Gait assistance: Supervision, Contact guard assist Gait Distance (Feet): 60 Feet Assistive device: Rolling walker (2 wheels) Gait Pattern/deviations: WFL(Within Functional Limits) Gait velocity: decreased  General Gait Details: pt ambulated on 10 L trach coillar with slow cautious gait. Pt did desaturate to 86%. with 4 minutes seated rest, pt recovers to >91%     Balance Overall balance assessment: Needs assistance Sitting-balance support: Feet supported, No upper extremity supported Sitting balance-[name] Scale: Good     Standing balance support: Bilateral upper extremity supported, Reliant on assistive device for balance Standing balance-[name] Scale: Fair       Cognition Arousal: Lethargic (pt a little more drowsy today than previously observed in earlier sessions this admission) Behavior During Therapy: [facility] for tasks assessed/performed Overall Cognitive Status: Within Functional Limits for tasks assessed      General Comments: Pt continues to  be A and O x 4. He continues to dictate session progression and unwilling to attempt standing from chair or standard heights. Pt stood from 33inch EOB                Pertinent Vitals/Pain Pain Assessment Pain Assessment: No/denies pain Pain Score: 0-No pain     PT Goals (current goals can now be found in the care plan section) Acute Rehab PT Goals Patient Stated Goal: Get better so I can go home Progress towards PT goals: Progressing toward goals    Frequency    Min 1X/week      PT Plan Discharge plan needs to be updated    Co-evaluation     PT goals addressed during session: Mobility/safety with mobility;Balance;Proper use of DME;Strengthening/ROM        AM-PAC PT "6 Clicks" Mobility   Outcome Measure  Help needed turning from your back to your side while in a flat bed without using bedrails?: None Help needed moving from lying on your back to sitting on the side of a flat bed without using bedrails?: A Little Help needed moving to and from a bed to a chair (including a wheelchair)?: A Lot Help needed standing up from a chair using your arms (e.g., wheelchair or bedside chair)?: A Lot Help needed to walk in hospital room?: A Little Help needed climbing 3-5 steps with a railing? : Total 6 Click Score: 15    End of Session Equipment Utilized During Treatment: Oxygen (10 L trach collar \) Activity Tolerance: Patient tolerated treatment well;Patient limited by fatigue Patient left: in chair;with call bell/phone within reach Nurse Communication: Mobility status PT Visit Diagnosis: Muscle weakness (generalized) (M62.81);Difficulty in walking, not elsewhere classified (R26.2)     Time: 4098-1191 PT Time Calculation (min) (ACUTE ONLY): 26 min  Charges:    $Gait Training: 8-22 mins $Therapeutic Activity: 8-22 mins PT General Charges $$ ACUTE PT VISIT: 1 Visit                    Jetta Lout PTA 01/17/23, 1:48 PM

## 2023-01-17 NOTE — TOC Progression Note (Signed)
Transition of Care [facility]) - Progression Note    Patient Details  Name: Gary [name] MRN: [id number] Date of Birth: 01-28-1956  Transition of Care [facility]) CM/SW Contact  Marlowe Sax, RN Phone Number: 01/17/2023, 10:07 AM  Clinical Narrative:    [facility] will accept the patient I Insurance will approve, updated clinica has been sent to Insurance awaiting approval   Expected Discharge Plan: Home w Home Health Services Barriers to Discharge: Continued Medical Work up  Expected Discharge Plan and Services     Post Acute Care Choice: Home Health                   DME Arranged: Bedside commode DME Agency: [employer] Date DME Agency Contacted: 01/12/23 Time DME Agency Contacted: 1427 Representative spoke with at DME Agency: [facility] Arranged: PT, OT HH Agency: [facility] Date [facility] Agency Contacted: 01/12/23 Time HH Agency Contacted: 1428 Representative spoke with at [facility] Agency: Kandee Keen   Social Determinants of Health (SDOH) Interventions SDOH Screenings   Food Insecurity: No Food Insecurity (12/01/2022)  Housing: Patient Declined (12/01/2022)  Transportation Needs: No Transportation Needs (12/01/2022)  Utilities: Not At Risk (12/01/2022)  Depression (PHQ2-9): Low Risk  (06/07/2021)  Financial Resource Strain: High Risk (11/21/2022)   Received from [facility]  Physical Activity: Insufficiently Active (05/03/2017)  Social Connections: Patient Declined (11/20/2022)   Received from [facility]  Stress: Stress Concern Present (11/20/2022)   Received from [facility]  Tobacco Use: Medium Risk (12/01/2022)    Readmission Risk Interventions    11/05/2022    2:59 PM 11/05/2022   12:16 PM 12/31/2021    8:58 AM  Readmission Risk Prevention Plan  Transportation Screening Complete Complete Complete  Medication Review [employer]) Complete Complete Complete  PCP or Specialist appointment within 3-5 days of discharge Complete Complete Complete  HRI or Home Care  Consult Complete Complete Complete  SW Recovery Care/Counseling Consult Complete Complete Complete  Palliative Care Screening Complete Complete Complete  Skilled Nursing Facility Not Applicable Not Applicable Not Applicable

## 2023-01-17 NOTE — Progress Notes (Signed)
PROGRESS NOTE    Gary [name]   ZOX:[id number] DOB: January 23, 1956  DOA: 11/28/2022 Date of Service: 01/17/23 which is hospital day 48  PCP: Shayne Alken, MD    Gary [name] is a 67 y.o. 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   Gary [name] stated that he ran out of his home torsemide several days ago due to a mixup with his pharmacy.  Then over the last couple days, he has noticed increasing lower extremity swelling, shortness of breath and chest pain.  He states that he usually develops chest pain when he has "fluid on his lungs."    Patient was initially diuresed with IV diuresis, responded well.  Significant improvement in peripheral edema.  IV Lasix was switched with home torsemide once creatinine started increasing.  Patient remained stable from respiratory standpoint point and remained on 6 to 8 L of supplemental oxygen via trach collar which is his baseline.  Patient developed Pseudomonas tracheitis, which was treated with Levaquin as he developed some pain around his tracheostomy.  Patient has end-stage COPD and need to continue follow-up with pulmonary as outpatient  9/25-10/2.  Patient needs a lot of assistance to get out of the bed.       ASSESSMENT & PLAN:   Acute on chronic diastolic CHF (congestive heart failure) ([name]) Will switch torsemide 40 mg twice a day to Lasix 40 mg IV twice daily to see if we can mobilize more fluid.  Continue spironolactone.   Acute on chronic respiratory failure with hypoxia and hypercapnia ([name]) COPD (chronic obstructive pulmonary disease)  Hypercapnic and Hypoxemic respiratory failure and OSA overlap syndorme  Continue with nebulizer therapy:  Have d/c pulmicort and steroids Yuperli once daily  with albuterol  IS and PT/OT is to be done daily per pulmonary   s/p Metaneb TID with Duoneb Previous plan was to go home w/ home health but current needs are LTAC for 10L O2 and ongoing physical therapy. He lives home alone. Anticipate additional therapy will allow the patient the opportunity to go back home after LTAC.   Continue trach collar 8-10 L.  Continue ventilator at night.  Patient will need prolonged weaning secondary to severe COPD with hypercapnic hypoxic respiratory failure and sleep apnea.  Hopefully he can wean back down to 6 to 8 L on the trach collar but have not been able to wean down to 8L but often needing 10L.   Pseudomonas tracheitis resolved  Completed levaquin and bactrim On chronic ppx w/ Bactrim three times per week  Tach site - bacitracin w/ dressing changes, change trach collar weekly Off steroids now   Acute kidney injury superimposed on CKD ([name]) Acute kidney injury on CKD stage IIIa.  Creatinine 1.44 with diuresis.  (On 9/18 did have a creatinine of 1.17).  Monitor with IV diuresis.   Hypotension Off midodrine   OSA (obstructive sleep apnea) Continue home ventilator at night   Chest pain ACS ruled out.   Chronic colitis Continue home mesalamine   Generalized weakness Continue working with physical therapy and mobility specialist to work on getting out of the bed.  Patient able to ambulate once out of the bed. Previous plan was to go home w/ home health but current needs are LTAC for 10L O2 and ongoing  physical therapy. He lives home alone. Anticipate additional therapy will allow the patient the opportunity to go back home after LTAC.    Morbid obesity with BMI of 50.0-59.9, adult ([name]) BMI 51.73 with current height and weight in computer.        Morbid obesity based on BMI: Body mass index is 51.73 kg/m.   DVT prophylaxis: lovenox  IV fluids: no continuous IV fluids  Nutrition: cardiac diet w/ fluid restriction Central lines / invasive  devices: none  Code Status: FULL CODE ACP documentation reviewed: 01/16/23 and none on file in [facility]  TOC needs: placement Barriers to dispo / significant pending items: placement              Subjective / Brief ROS:  Patient reports no concerns today Denies CP/SOB.  Pain controlled.  Denies new weakness.  Tolerating diet.  Reports no concerns w/ urination/defecation.   Family Communication: none at this time     Objective Findings:  Vitals:   01/16/23 2027 01/16/23 2112 01/17/23 0751 01/17/23 0817  BP: 116/77   105/75  Pulse: 87   83  Resp: 20   16  Temp: 98.3 F (36.8 C)   98.2 F (36.8 C)  TempSrc:      SpO2: 91% 90% 90% (!) 89%  Weight:      Height:        Intake/Output Summary (Last 24 hours) at 01/17/2023 1347 Last data filed at 01/17/2023 1019 Gross per 24 hour  Intake 600 ml  Output 1600 ml  Net -1000 ml   Filed Weights   01/10/23 0500  Weight: (!) 192.8 kg    Examination:  Physical Exam Constitutional:      General: He is not in acute distress.    Appearance: He is obese.  Cardiovascular:     Rate and Rhythm: Normal rate and regular rhythm.     Heart sounds: Normal heart sounds.  Pulmonary:     Effort: Pulmonary effort is normal. No accessory muscle usage.  Neurological:     General: No focal deficit present.     Mental Status: He is alert.  Psychiatric:        Mood and Affect: Mood normal.        Behavior: Behavior normal.          Scheduled Medications:   acidophilus  2 capsule Oral BID   arformoterol  15 mcg Nebulization BID   bacitracin   Topical Daily   docusate sodium  100 mg Oral Daily   enoxaparin (LOVENOX) injection  100 mg Subcutaneous Q24H   ferrous sulfate  325 mg Oral Q breakfast   furosemide  40 mg Intravenous BID   Gerhardt's butt cream   Topical Daily   guaiFENesin  1,200 mg Oral BID   Ipratropium-Albuterol  1 puff Inhalation Q6H   Mesalamine  800 mg Oral BID   mouth rinse  15 mL Mouth Rinse 4 times  per day   revefenacin  175 mcg Nebulization Daily   spironolactone  25 mg Oral Daily    Continuous Infusions:   PRN Medications:  acetaminophen, albuterol, hydrOXYzine, ondansetron (ZOFRAN) IV, mouth rinse, trolamine salicylate  Antimicrobials from admission:  Anti-infectives (From admission, onward)    Start     Dose/Rate Route Frequency Ordered Stop   01/02/23 0900  sulfamethoxazole-trimethoprim (BACTRIM) 400-80 MG per tablet 1 tablet  Status:  Discontinued        1 tablet Oral Once per day on Monday Wednesday Friday 01/01/23  1207 01/12/23 1002   12/03/22 1615  levofloxacin (LEVAQUIN) tablet 750 mg        750 mg Oral Daily 12/03/22 1520 12/07/22 0831           Data Reviewed:  I have personally reviewed the following...  CBC: Recent Labs  Lab 01/15/23 0842  WBC 7.7  HGB 10.3*  HCT 39.2  MCV 79.2*  PLT 221   Basic Metabolic Panel: Recent Labs  Lab 01/13/23 0823 01/14/23 0754 01/16/23 0818  NA 138 139 134*  K 3.9 4.0 3.9  CL 97* 98 97*  CO2 33* 33* 32  GLUCOSE 158* 114* 147*  BUN 33* 33* 40*  CREATININE 1.48* 1.44* 1.54*  CALCIUM 8.3* 8.2* 8.1*   GFR: Estimated Creatinine Clearance: 87.4 mL/min (A) (by C-G formula based on SCr of 1.54 mg/dL (H)). Liver Function Tests: No results for input(s): "AST", "ALT", "ALKPHOS", "BILITOT", "PROT", "ALBUMIN" in the last 168 hours. No results for input(s): "LIPASE", "AMYLASE" in the last 168 hours. No results for input(s): "AMMONIA" in the last 168 hours. Coagulation Profile: No results for input(s): "INR", "PROTIME" in the last 168 hours. Cardiac Enzymes: No results for input(s): "CKTOTAL", "CKMB", "CKMBINDEX", "TROPONINI" in the last 168 hours. BNP (last 3 results) No results for input(s): "PROBNP" in the last 8760 hours. HbA1C: No results for input(s): "HGBA1C" in the last 72 hours. CBG: No results for input(s): "GLUCAP" in the last 168 hours. Lipid Profile: No results for input(s): "CHOL", "HDL",  "LDLCALC", "TRIG", "CHOLHDL", "LDLDIRECT" in the last 72 hours. Thyroid Function Tests: No results for input(s): "TSH", "T4TOTAL", "FREET4", "T3FREE", "THYROIDAB" in the last 72 hours. Anemia Panel: No results for input(s): "VITAMINB12", "FOLATE", "FERRITIN", "TIBC", "IRON", "RETICCTPCT" in the last 72 hours. Most Recent Urinalysis On File:     Component Value Date/Time   COLORURINE YELLOW 11/07/2022 2151   APPEARANCEUR CLEAR 11/07/2022 2151   APPEARANCEUR Clear 04/15/2014 1240   LABSPEC 1.013 11/07/2022 2151   LABSPEC 1.013 04/15/2014 1240   PHURINE 5.0 11/07/2022 2151   GLUCOSEU NEGATIVE 11/07/2022 2151   GLUCOSEU Negative 04/15/2014 1240   HGBUR NEGATIVE 11/07/2022 2151   BILIRUBINUR NEGATIVE 11/07/2022 2151   BILIRUBINUR Negative 04/15/2014 1240   KETONESUR NEGATIVE 11/07/2022 2151   PROTEINUR NEGATIVE 11/07/2022 2151   NITRITE NEGATIVE 11/07/2022 2151   LEUKOCYTESUR NEGATIVE 11/07/2022 2151   LEUKOCYTESUR Negative 04/15/2014 1240   Sepsis Labs: @LABRCNTIP (procalcitonin:4,lacticidven:4) Microbiology: No results found for this or any previous visit (from the past 240 hour(s)).    Radiology Studies last 3 days: No results found.     Time spent: 25 min    Sunnie Nielsen, DO [facility] 01/17/2023, 1:47 PM    Dictation software may have been used to generate the above note. Typos may occur and escape review in typed/dictated notes. Please contact Dr Lyn Hollingshead directly for clarity if needed.  Staff may message me via secure chat in Epic  but this may not receive an immediate response,  please page me for urgent matters!  If 7PM-7AM, please contact night coverage [email or web address]
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
## 2023-01-18 DIAGNOSIS — J9601 Acute respiratory failure with hypoxia: Secondary | ICD-10-CM | POA: Diagnosis not present

## 2023-01-18 DIAGNOSIS — J441 Chronic obstructive pulmonary disease with (acute) exacerbation: Secondary | ICD-10-CM | POA: Diagnosis not present

## 2023-01-18 DIAGNOSIS — I5033 Acute on chronic diastolic (congestive) heart failure: Secondary | ICD-10-CM | POA: Diagnosis not present

## 2023-01-18 DIAGNOSIS — E8770 Fluid overload, unspecified: Secondary | ICD-10-CM | POA: Diagnosis not present

## 2023-01-18 DIAGNOSIS — J9602 Acute respiratory failure with hypercapnia: Secondary | ICD-10-CM | POA: Diagnosis not present

## 2023-01-18 NOTE — Progress Notes (Signed)
PROGRESS NOTE    Gary [name]   ZOX:[id number] DOB: 10/30/1955  DOA: 11/28/2022 Date of Service: 01/18/23 which is hospital day 49  PCP: Gary Alken, MD    Gary [name] is a 67 y.o. 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   Gary [name] stated that Gary [name] ran out of his home torsemide several days ago due to a mixup with his pharmacy.  Then over the last couple days, Gary [name] has noticed increasing lower extremity swelling, shortness of breath and chest pain.  Gary [name] states that Gary [name] usually develops chest pain when Gary [name] has "fluid on his lungs."    Patient was initially diuresed with IV diuresis, responded well.  Significant improvement in peripheral edema.  IV Lasix was switched with home torsemide once creatinine started increasing.  Patient remained stable from respiratory standpoint point and remained on 6 to 8 L of supplemental oxygen via trach collar which is his baseline.  Patient developed Pseudomonas tracheitis, which was treated with Levaquin as Gary [name] developed some pain around his tracheostomy.  Patient has end-stage COPD and need to continue follow-up with pulmonary as outpatient  9/25-10/4.  Patient needs a lot of assistance to get out of the bed.       ASSESSMENT & PLAN:   Acute on chronic diastolic CHF (congestive heart failure) ([name]) Will switch torsemide 40 mg twice a day to Lasix 40 mg IV twice daily to see if we can mobilize more fluid.  Continue spironolactone.   Acute on chronic respiratory failure with hypoxia and hypercapnia ([name]) COPD (chronic obstructive pulmonary disease)  Hypercapnic and Hypoxemic respiratory failure and OSA overlap syndorme  Continue with nebulizer therapy:  Have d/c pulmicort and steroids Yuperli once daily  with albuterol  IS and PT/OT is to be done daily per pulmonary   s/p Metaneb TID with Duoneb Previous plan was to go home w/ home health but current needs are LTAC for 10L O2 and ongoing physical therapy. Gary [name] lives home alone. Anticipate additional therapy will allow the patient the opportunity to go back home after LTAC.   Continue trach collar 8-10 L.  Continue ventilator at night.  Patient will need prolonged weaning secondary to severe COPD with hypercapnic hypoxic respiratory failure and sleep apnea.  Hopefully Gary [name] can wean back down to 6 to 8 L on the trach collar but have not been able to wean down to 8L but often needing 10L.   Pseudomonas tracheitis resolved  Completed levaquin and bactrim On chronic ppx w/ Bactrim three times per week  Tach site - bacitracin w/ dressing changes, change trach collar weekly Off steroids now   Acute kidney injury superimposed on CKD ([name]) Acute kidney injury on CKD stage IIIa.  Creatinine 1.44 with diuresis.  (On 9/18 did have a creatinine of 1.17).  Monitor with IV diuresis.   Hypotension Off midodrine   OSA (obstructive sleep apnea) Continue home ventilator at night   Chest pain ACS ruled out.   Chronic colitis Continue home mesalamine   Generalized weakness Continue working with physical therapy and mobility specialist to work on getting out of the bed.  Patient able to ambulate once out of the bed. Previous plan was to go home w/ home health but current needs are LTAC for 10L O2 and ongoing  physical therapy. Gary [name] lives home alone. Anticipate additional therapy will allow the patient the opportunity to go back home after LTAC.    Morbid obesity with BMI of 50.0-59.9, adult ([name]) BMI 51.73 with current height and weight in computer.        Morbid obesity based on BMI: Body mass index is 51.73 kg/m.   DVT prophylaxis: lovenox  IV fluids: no continuous IV fluids  Nutrition: cardiac diet w/ fluid restriction Central lines / invasive  devices: none  Code Status: FULL CODE ACP documentation reviewed: 01/16/23 and none on file in [facility]  TOC needs: placement Barriers to dispo / significant pending items: placement              Subjective / Brief ROS:  Patient reports no concerns today   Family Communication: none at this time     Objective Findings:  Vitals:   01/17/23 2138 01/17/23 2324 01/17/23 2336 01/18/23 0819  BP:  116/72  123/81  Pulse:  85 88 80  Resp:    16  Temp:  98.3 F (36.8 C)    TempSrc:      SpO2: (!) 88% (!) 81% 100% 93%  Weight:      Height:        Intake/Output Summary (Last 24 hours) at 01/18/2023 1637 Last data filed at 01/18/2023 1200 Gross per 24 hour  Intake --  Output 1850 ml  Net -1850 ml   Filed Weights   01/10/23 0500  Weight: (!) 192.8 kg    Examination:  Physical Exam Constitutional:      General: Gary [name] is not in acute distress.    Appearance: Gary [name] is obese.  Pulmonary:     Effort: Pulmonary effort is normal. No accessory muscle usage.  Neurological:     Mental Status: Gary [name] is alert.  Psychiatric:        Mood and Affect: Mood normal.        Behavior: Behavior normal.          Scheduled Medications:   acidophilus  2 capsule Oral BID   arformoterol  15 mcg Nebulization BID   bacitracin   Topical Daily   docusate sodium  100 mg Oral Daily   enoxaparin (LOVENOX) injection  100 mg Subcutaneous Q24H   ferrous sulfate  325 mg Oral Q breakfast   furosemide  40 mg Intravenous BID   Gerhardt's butt cream   Topical Daily   guaiFENesin  1,200 mg Oral BID   Ipratropium-Albuterol  1 puff Inhalation Q6H   Mesalamine  800 mg Oral BID   mouth rinse  15 mL Mouth Rinse 4 times per day   revefenacin  175 mcg Nebulization Daily   spironolactone  25 mg Oral Daily    Continuous Infusions:   PRN Medications:  acetaminophen, albuterol, hydrOXYzine, ondansetron (ZOFRAN) IV, mouth rinse, trolamine salicylate  Antimicrobials from admission:  Anti-infectives  (From admission, onward)    Start     Dose/Rate Route Frequency Ordered Stop   01/02/23 0900  sulfamethoxazole-trimethoprim (BACTRIM) 400-80 MG per tablet 1 tablet  Status:  Discontinued        1 tablet Oral Once per day on Monday Wednesday Friday 01/01/23 1207 01/12/23 1002   12/03/22 1615  levofloxacin (LEVAQUIN) tablet 750 mg        750 mg Oral Daily 12/03/22 1520 12/07/22 0831           Data Reviewed:  I have personally reviewed the following...  CBC: Recent Labs  Lab  01/15/23 0842  WBC 7.7  HGB 10.3*  HCT 39.2  MCV 79.2*  PLT 221   Basic Metabolic Panel: Recent Labs  Lab 01/13/23 0823 01/14/23 0754 01/16/23 0818  NA 138 139 134*  K 3.9 4.0 3.9  CL 97* 98 97*  CO2 33* 33* 32  GLUCOSE 158* 114* 147*  BUN 33* 33* 40*  CREATININE 1.48* 1.44* 1.54*  CALCIUM 8.3* 8.2* 8.1*   GFR: Estimated Creatinine Clearance: 87.4 mL/min (A) (by C-G formula based on SCr of 1.54 mg/dL (H)). Liver Function Tests: No results for input(s): "AST", "ALT", "ALKPHOS", "BILITOT", "PROT", "ALBUMIN" in the last 168 hours. No results for input(s): "LIPASE", "AMYLASE" in the last 168 hours. No results for input(s): "AMMONIA" in the last 168 hours. Coagulation Profile: No results for input(s): "INR", "PROTIME" in the last 168 hours. Cardiac Enzymes: No results for input(s): "CKTOTAL", "CKMB", "CKMBINDEX", "TROPONINI" in the last 168 hours. BNP (last 3 results) No results for input(s): "PROBNP" in the last 8760 hours. HbA1C: No results for input(s): "HGBA1C" in the last 72 hours. CBG: No results for input(s): "GLUCAP" in the last 168 hours. Lipid Profile: No results for input(s): "CHOL", "HDL", "LDLCALC", "TRIG", "CHOLHDL", "LDLDIRECT" in the last 72 hours. Thyroid Function Tests: No results for input(s): "TSH", "T4TOTAL", "FREET4", "T3FREE", "THYROIDAB" in the last 72 hours. Anemia Panel: No results for input(s): "VITAMINB12", "FOLATE", "FERRITIN", "TIBC", "IRON", "RETICCTPCT" in  the last 72 hours. Most Recent Urinalysis On File:     Component Value Date/Time   COLORURINE YELLOW 11/07/2022 2151   APPEARANCEUR CLEAR 11/07/2022 2151   APPEARANCEUR Clear 04/15/2014 1240   LABSPEC 1.013 11/07/2022 2151   LABSPEC 1.013 04/15/2014 1240   PHURINE 5.0 11/07/2022 2151   GLUCOSEU NEGATIVE 11/07/2022 2151   GLUCOSEU Negative 04/15/2014 1240   HGBUR NEGATIVE 11/07/2022 2151   BILIRUBINUR NEGATIVE 11/07/2022 2151   BILIRUBINUR Negative 04/15/2014 1240   KETONESUR NEGATIVE 11/07/2022 2151   PROTEINUR NEGATIVE 11/07/2022 2151   NITRITE NEGATIVE 11/07/2022 2151   LEUKOCYTESUR NEGATIVE 11/07/2022 2151   LEUKOCYTESUR Negative 04/15/2014 1240   Sepsis Labs: @LABRCNTIP (procalcitonin:4,lacticidven:4) Microbiology: No results found for this or any previous visit (from the past 240 hour(s)).    Radiology Studies last 3 days: No results found.     Time spent: 25 min    Sunnie Nielsen, DO [facility] 01/18/2023, 4:37 PM    Dictation software may have been used to generate the above note. Typos may occur and escape review in typed/dictated notes. Please contact Dr Lyn Hollingshead directly for clarity if needed.  Staff may message me via secure chat in Epic  but this may not receive an immediate response,  please page me for urgent matters!  If 7PM-7AM, please contact night coverage [email or web address]
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
## 2023-01-18 NOTE — Progress Notes (Signed)
Mobility Specialist - Progress Note   01/18/23 1608  Mobility  Activity Transferred from chair to bed  Level of Assistance +2 (takes two people)  Assistive Device  Water quality scientist lift)  Activity Response Tolerated well  $Mobility charge 1 Mobility  Mobility Specialist Start Time (ACUTE ONLY) 1520  Mobility Specialist Stop Time (ACUTE ONLY) 1538  Mobility Specialist Time Calculation (min) (ACUTE ONLY) 18 min   Pt transferred from recliner to bed via [employer] lift +2. Pt left in fowler position with needs within reach.  Zetta Bills Mobility Specialist 01/18/23 4:10 PM

## 2023-01-18 NOTE — Plan of Care (Signed)

## 2023-01-18 NOTE — Progress Notes (Signed)
Occupational Therapy Treatment Patient Details Name: Gary [name] MRN: [id number] DOB: 1955-07-20 Today's Date: 01/18/2023   History of present illness 67 y/o male presented to [facility] ED on 11/28/22 for chest pain x 4 days. Admitted for acute on chronic CHF. Frequent admissions this year with most recent dsicharge on 11/21/22. PMH includes obesity, hypoxia, respiratory failure with tracheostomy, COPD, GIB, HFpEF.   OT comments  Gary [name] was seen for OT treatment on this date. Upon arrival to room pt in bed, agreeable to tx. Pt requires MAX A don B shoes seated EOB. CGA + RW for ADL t/f ~100 ft, SpO2 95% on 8L trach collar. Pt making good progress toward goals, will continue to follow POC. Discharge recommendation remains appropriate.        If plan is discharge home, recommend the following:  A little help with walking and/or transfers;A lot of help with bathing/dressing/bathroom   Equipment Recommendations  None recommended by OT    Recommendations for Other Services      Precautions / Restrictions Precautions Precautions: Fall Precaution Comments: trach collar/ Trach Restrictions Weight Bearing Restrictions: No       Mobility Bed Mobility Overal bed mobility: Modified Independent                  Transfers Overall transfer level: Needs assistance Equipment used: Rolling walker (2 wheels) Transfers: Sit to/from Stand Sit to Stand: Supervision, From elevated surface                 Balance Overall balance assessment: Needs assistance Sitting-balance support: Feet supported, No upper extremity supported Sitting balance-[name] Scale: Good     Standing balance support: Bilateral upper extremity supported Standing balance-[name] Scale: Fair                             ADL either performed or assessed with clinical judgement   ADL Overall ADL's : Needs assistance/impaired                                       General ADL  Comments: MAX A don B shoes seated EOB      Cognition Arousal: Alert Behavior During Therapy: WFL for tasks assessed/performed Overall Cognitive Status: Within Functional Limits for tasks assessed                                                General Comments SpO2 94% on 8L trach collar    Pertinent Vitals/ Pain       Pain Assessment Pain Assessment: No/denies pain   Frequency  Min 1X/week        Progress Toward Goals  OT Goals(current goals can now be found in the care plan section)  Progress towards OT goals: Progressing toward goals  Acute Rehab OT Goals Patient Stated Goal: to go home OT Goal Formulation: With patient Time For Goal Achievement: 01/28/23 Potential to Achieve Goals: Good ADL Goals Pt Will Perform Grooming: with modified independence;standing Pt Will Transfer to Toilet: with modified independence;ambulating;regular height toilet Pt Will Perform Toileting - Clothing Manipulation and hygiene: with mod assist;sit to/from stand  Plan Discharge plan remains appropriate;Frequency remains appropriate    Co-evaluation  AM-PAC OT "6 Clicks" Daily Activity     Outcome Measure   Help from another person eating meals?: None Help from another person taking care of personal grooming?: A Little Help from another person toileting, which includes using toliet, bedpan, or urinal?: A Lot Help from another person bathing (including washing, rinsing, drying)?: A Lot Help from another person to put on and taking off regular upper body clothing?: None Help from another person to put on and taking off regular lower body clothing?: A Lot 6 Click Score: 17    End of Session    OT Visit Diagnosis: Unsteadiness on feet (R26.81);Other abnormalities of gait and mobility (R26.89);Muscle weakness (generalized) (M62.81);Adult, failure to thrive (R62.7)   Activity Tolerance Patient tolerated treatment well   Patient Left with  call bell/phone within reach;in chair;with nursing/sitter in room   Nurse Communication          Time: 8657-8469 OT Time Calculation (min): 23 min  Charges: OT General Charges $OT Visit: 1 Visit OT Treatments $Self Care/Home Management : 8-22 mins $Therapeutic Activity: 8-22 mins  Kathie Dike, M.S. OTR/L  01/18/23, 2:26 PM  ascom [phone]

## 2023-01-18 NOTE — TOC Progression Note (Signed)
Transition of Care [facility]) - Progression Note    Patient Details  Name: Gary [name] MRN: [id number] Date of Birth: Mar 02, 1956  [facility] CM/SW Contact  Marlowe Sax, RN Phone Number: 01/18/2023, 2:08 PM  Clinical Narrative:    Spoke with the patient and let him know Insurance has denied to [facility] stay, he has requested to Appeal. [name] with [facility] is emailing the forms to do the appeal   Expected Discharge Plan: Home w Home Health Services Barriers to Discharge: Continued Medical Work up  Expected Discharge Plan and Services     Post Acute Care Choice: Home Health                   DME Arranged: Bedside commode DME Agency: [employer] Date DME Agency Contacted: 01/12/23 Time DME Agency Contacted: 1427 Representative spoke with at DME Agency: [facility] Arranged: PT, OT HH Agency: [facility] Date [facility] Agency Contacted: 01/12/23 Time HH Agency Contacted: 1428 Representative spoke with at [facility] Agency: Kandee Keen   Social Determinants of Health (SDOH) Interventions SDOH Screenings   Food Insecurity: No Food Insecurity (12/01/2022)  Housing: Patient Declined (12/01/2022)  Transportation Needs: No Transportation Needs (12/01/2022)  Utilities: Not At Risk (12/01/2022)  Depression (PHQ2-9): Low Risk  (06/07/2021)  Financial Resource Strain: High Risk (11/21/2022)   Received from [facility]  Physical Activity: Insufficiently Active (05/03/2017)  Social Connections: Patient Declined (11/20/2022)   Received from [facility]  Stress: Stress Concern Present (11/20/2022)   Received from [facility]  Tobacco Use: Medium Risk (12/01/2022)    Readmission Risk Interventions    11/05/2022    2:59 PM 11/05/2022   12:16 PM 12/31/2021    8:58 AM  Readmission Risk Prevention Plan  Transportation Screening Complete Complete Complete  Medication Review Oceanographer) Complete Complete Complete  PCP or Specialist appointment within 3-5 days of discharge  Complete Complete Complete  HRI or Home Care Consult Complete Complete Complete  SW Recovery Care/Counseling Consult Complete Complete Complete  Palliative Care Screening Complete Complete Complete  Skilled Nursing Facility Not Applicable Not Applicable Not Applicable

## 2023-01-18 NOTE — Plan of Care (Signed)
°  Problem: Clinical Measurements: °Goal: Ability to maintain clinical measurements within normal limits will improve °Outcome: Progressing °  °Problem: Clinical Measurements: °Goal: Will remain free from infection °Outcome: Progressing °  °Problem: Clinical Measurements: °Goal: Respiratory complications will improve °Outcome: Progressing °  °Problem: Activity: °Goal: Risk for activity intolerance will decrease °Outcome: Progressing °  °Problem: Safety: °Goal: Ability to remain free from injury will improve °Outcome: Progressing °  °

## 2023-01-19 DIAGNOSIS — I5033 Acute on chronic diastolic (congestive) heart failure: Secondary | ICD-10-CM | POA: Diagnosis not present

## 2023-01-19 DIAGNOSIS — E8770 Fluid overload, unspecified: Secondary | ICD-10-CM | POA: Diagnosis not present

## 2023-01-19 DIAGNOSIS — J9602 Acute respiratory failure with hypercapnia: Secondary | ICD-10-CM | POA: Diagnosis not present

## 2023-01-19 DIAGNOSIS — J9601 Acute respiratory failure with hypoxia: Secondary | ICD-10-CM | POA: Diagnosis not present

## 2023-01-19 DIAGNOSIS — J441 Chronic obstructive pulmonary disease with (acute) exacerbation: Secondary | ICD-10-CM | POA: Diagnosis not present

## 2023-01-19 MED ORDER — GUAIFENESIN ER 600 MG PO TB12
1200.0000 mg | ORAL_TABLET | Freq: Two times a day (BID) | ORAL | Status: DC
  Administered 2023-01-19 – 2023-04-23 (×189): 1200 mg via ORAL
  Filled 2023-01-19 (×190): qty 2

## 2023-01-19 MED ORDER — SODIUM CHLORIDE 3 % IN NEBU
4.0000 mL | INHALATION_SOLUTION | Freq: Two times a day (BID) | RESPIRATORY_TRACT | Status: AC
  Administered 2023-01-19 – 2023-01-21 (×5): 4 mL via RESPIRATORY_TRACT
  Filled 2023-01-19 (×6): qty 4

## 2023-01-19 NOTE — Plan of Care (Signed)
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
## 2023-01-19 NOTE — Progress Notes (Signed)
PROGRESS NOTE    Gary [name]   ZOX:[id number] DOB: [birth year]  DOA: 11/28/2022 Date of Service: 01/19/23 which is hospital day 50  PCP: Shayne Alken, MD    Gary [name] is a 67 y.o. 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   Mr. Spaur stated that he ran out of his home torsemide several days ago due to a mixup with his pharmacy.  Then over the last couple days, he has noticed increasing lower extremity swelling, shortness of breath and chest pain.  He states that he usually develops chest pain when he has "fluid on his lungs."    Patient was initially diuresed with IV diuresis, responded well.  Significant improvement in peripheral edema.  IV Lasix was switched with home torsemide once creatinine started increasing.  Patient remained stable from respiratory standpoint point and remained on 6 to 8 L of supplemental oxygen via trach collar which is his baseline.  Patient developed Pseudomonas tracheitis, which was treated with Levaquin as he developed some pain around his tracheostomy.  Patient has end-stage COPD and need to continue follow-up with pulmonary as outpatient  9/25-10/5.  Placement pending. Patient needs a lot of assistance to get out of the bed. Appealing LTACH denial        ASSESSMENT & PLAN:   Acute on chronic diastolic CHF (congestive heart failure) ([name]) Will switch torsemide 40 mg twice a day to Lasix 40 mg IV twice daily to see if we can mobilize more fluid.  Continue spironolactone.   Acute on chronic respiratory failure with hypoxia and hypercapnia ([name]) COPD (chronic obstructive pulmonary disease)  Hypercapnic and Hypoxemic respiratory failure and OSA overlap syndorme  Continue with nebulizer therapy:  Have d/c  pulmicort and steroids Yuperli once daily with albuterol  IS and PT/OT is to be done daily per pulmonary   s/p Metaneb TID with Duoneb Previous plan was to go home w/ home health but current needs are LTAC for 10L O2 and ongoing physical therapy. He lives home alone. Anticipate additional therapy will allow the patient the opportunity to go back home after LTAC.   Continue trach collar 8-10 L.  Continue ventilator at night.  Patient will need prolonged weaning secondary to severe COPD with hypercapnic hypoxic respiratory failure and sleep apnea.  Hopefully he can wean back down to 6 to 8 L on the trach collar but have not been able to wean down to 8L but often needing 10L.  hypertonic saline and mucinex with chest PT to help clear secretions.  Potential to improve w/ appropriate rehab   Pseudomonas tracheitis resolved  Completed levaquin and bactrim On chronic ppx w/ Bactrim three times per week  Tach site - bacitracin w/ dressing changes, change trach collar weekly Off steroids now   Acute kidney injury superimposed on CKD ([name]) Acute kidney injury on CKD stage IIIa.  Creatinine 1.44 with diuresis.  (On 9/18 did have a creatinine of 1.17).  Monitor with IV diuresis.   Hypotension Off midodrine   OSA (obstructive sleep apnea) Continue home ventilator at night   Chest pain ACS ruled out.   Chronic colitis Continue home mesalamine   Generalized weakness Continue working with physical therapy and mobility specialist to work on getting out of the bed.  Patient able to  ambulate once out of the bed. Previous plan was to go home w/ home health but current needs are LTAC for 10L O2 and ongoing physical therapy. He lives home alone. Anticipate additional therapy will allow the patient the opportunity to go back home after LTAC.    Morbid obesity with BMI of 50.0-59.9, adult ([name]) BMI 51.73 with current height and weight in computer.        Morbid obesity based on BMI: Body mass index  is 51.73 kg/m.   DVT prophylaxis: lovenox  IV fluids: no continuous IV fluids  Nutrition: cardiac diet w/ fluid restriction Central lines / invasive devices: none  Code Status: FULL CODE ACP documentation reviewed: 01/16/23 and none on file in [facility]  TOC needs: placement Barriers to dispo / significant pending items: placement              Subjective / Brief ROS:  Patient reports no concerns today   Family Communication: none at this time     Objective Findings:  Vitals:   01/18/23 1704 01/18/23 2054 01/18/23 2234 01/19/23 0743  BP: 109/65  116/74 (!) 96/53  Pulse: 84  84 77  Resp: 19  20 16   Temp: 97.9 F (36.6 C)  98 F (36.7 C) 98.4 F (36.9 C)  TempSrc: Oral     SpO2: 94% 93% 92% 94%  Weight:      Height:        Intake/Output Summary (Last 24 hours) at 01/19/2023 1301 Last data filed at 01/19/2023 0900 Gross per 24 hour  Intake 240 ml  Output 1600 ml  Net -1360 ml   Filed Weights   01/10/23 0500  Weight: (!) 192.8 kg    Examination:  Physical Exam Constitutional:      General: He is not in acute distress.    Appearance: He is obese.  Pulmonary:     Effort: Pulmonary effort is normal. No accessory muscle usage.  Neurological:     Mental Status: He is alert.  Psychiatric:        Mood and Affect: Mood normal.        Behavior: Behavior normal.          Scheduled Medications:   acidophilus  2 capsule Oral BID   arformoterol  15 mcg Nebulization BID   bacitracin   Topical Daily   docusate sodium  100 mg Oral Daily   enoxaparin (LOVENOX) injection  100 mg Subcutaneous Q24H   ferrous sulfate  325 mg Oral Q breakfast   furosemide  40 mg Intravenous BID   Gerhardt's butt cream   Topical Daily   guaiFENesin  1,200 mg Oral BID   Ipratropium-Albuterol  1 puff Inhalation Q6H   Mesalamine  800 mg Oral BID   mouth rinse  15 mL Mouth Rinse 4 times per day   revefenacin  175 mcg Nebulization Daily   sodium chloride HYPERTONIC  4 mL  Nebulization BID   spironolactone  25 mg Oral Daily    Continuous Infusions:   PRN Medications:  acetaminophen, albuterol, hydrOXYzine, ondansetron (ZOFRAN) IV, mouth rinse, trolamine salicylate  Antimicrobials from admission:  Anti-infectives (From admission, onward)    Start     Dose/Rate Route Frequency Ordered Stop   01/02/23 0900  sulfamethoxazole-trimethoprim (BACTRIM) 400-80 MG per tablet 1 tablet  Status:  Discontinued        1 tablet Oral Once per day on Monday Wednesday Friday 01/01/23 1207 01/12/23 1002   12/03/22 1615  levofloxacin (LEVAQUIN) tablet 750  mg        750 mg Oral Daily 12/03/22 1520 12/07/22 0831           Data Reviewed:  I have personally reviewed the following...  CBC: Recent Labs  Lab 01/15/23 0842  WBC 7.7  HGB 10.3*  HCT 39.2  MCV 79.2*  PLT 221   Basic Metabolic Panel: Recent Labs  Lab 01/13/23 0823 01/14/23 0754 01/16/23 0818  NA 138 139 134*  K 3.9 4.0 3.9  CL 97* 98 97*  CO2 33* 33* 32  GLUCOSE 158* 114* 147*  BUN 33* 33* 40*  CREATININE 1.48* 1.44* 1.54*  CALCIUM 8.3* 8.2* 8.1*   GFR: Estimated Creatinine Clearance: 87.4 mL/min (A) (by C-G formula based on SCr of 1.54 mg/dL (H)). Liver Function Tests: No results for input(s): "AST", "ALT", "ALKPHOS", "BILITOT", "PROT", "ALBUMIN" in the last 168 hours. No results for input(s): "LIPASE", "AMYLASE" in the last 168 hours. No results for input(s): "AMMONIA" in the last 168 hours. Coagulation Profile: No results for input(s): "INR", "PROTIME" in the last 168 hours. Cardiac Enzymes: No results for input(s): "CKTOTAL", "CKMB", "CKMBINDEX", "TROPONINI" in the last 168 hours. BNP (last 3 results) No results for input(s): "PROBNP" in the last 8760 hours. HbA1C: No results for input(s): "HGBA1C" in the last 72 hours. CBG: No results for input(s): "GLUCAP" in the last 168 hours. Lipid Profile: No results for input(s): "CHOL", "HDL", "LDLCALC", "TRIG", "CHOLHDL", "LDLDIRECT"  in the last 72 hours. Thyroid Function Tests: No results for input(s): "TSH", "T4TOTAL", "FREET4", "T3FREE", "THYROIDAB" in the last 72 hours. Anemia Panel: No results for input(s): "VITAMINB12", "FOLATE", "FERRITIN", "TIBC", "IRON", "RETICCTPCT" in the last 72 hours. Most Recent Urinalysis On File:     Component Value Date/Time   COLORURINE YELLOW 11/07/2022 2151   APPEARANCEUR CLEAR 11/07/2022 2151   APPEARANCEUR Clear 04/15/2014 1240   LABSPEC 1.013 11/07/2022 2151   LABSPEC 1.013 04/15/2014 1240   PHURINE 5.0 11/07/2022 2151   GLUCOSEU NEGATIVE 11/07/2022 2151   GLUCOSEU Negative 04/15/2014 1240   HGBUR NEGATIVE 11/07/2022 2151   BILIRUBINUR NEGATIVE 11/07/2022 2151   BILIRUBINUR Negative 04/15/2014 1240   KETONESUR NEGATIVE 11/07/2022 2151   PROTEINUR NEGATIVE 11/07/2022 2151   NITRITE NEGATIVE 11/07/2022 2151   LEUKOCYTESUR NEGATIVE 11/07/2022 2151   LEUKOCYTESUR Negative 04/15/2014 1240   Sepsis Labs: @LABRCNTIP (procalcitonin:4,lacticidven:4) Microbiology: No results found for this or any previous visit (from the past 240 hour(s)).    Radiology Studies last 3 days: No results found.     Time spent: 25 min    Sunnie Nielsen, DO [facility] 01/19/2023, 1:01 PM    Dictation software may have been used to generate the above note. Typos may occur and escape review in typed/dictated notes. Please contact Dr Lyn Hollingshead directly for clarity if needed.  Staff may message me via secure chat in Epic  but this may not receive an immediate response,  please page me for urgent matters!  If 7PM-7AM, please contact night coverage [email or web address]

## 2023-01-20 DIAGNOSIS — I5033 Acute on chronic diastolic (congestive) heart failure: Secondary | ICD-10-CM | POA: Diagnosis not present

## 2023-01-20 NOTE — Progress Notes (Signed)
Mobility Specialist - Progress Note   01/20/23 1331  Mobility  Activity Ambulated with assistance in hallway;Stood at bedside;Dangled on edge of bed  Level of Assistance Standby assist, set-up cues, supervision of patient - no hands on  Assistive Device Front wheel walker  Distance Ambulated (ft) 40 ft  Activity Response Tolerated well  Mobility Referral Yes  $Mobility charge 1 Mobility  Mobility Specialist Start Time (ACUTE ONLY) 1301  Mobility Specialist Stop Time (ACUTE ONLY) 1331  Mobility Specialist Time Calculation (min) (ACUTE ONLY) 30 min   Pt semi-supine utilizing Trach upon arrival. Pt completes bed mobility indep with extra time. Pt needs MAX A don B shoes EOB. Pt STS and ambulates in hallway SBA + 2 for chair follow. Pt left in recliner with needs in reach.   [name]  Mobility Specialist  01/20/23 1:35 PM

## 2023-01-20 NOTE — Progress Notes (Signed)
Patient with some sob. Per RN patient desats but comes back up to low 90's. Patient with loose cough. Continues to cough secretions from trach without difficulty. Albuterol tx given via trach collar. Placed patient on home vent for some positive pressure. Updated RN. Will continue to monitor

## 2023-01-20 NOTE — Plan of Care (Signed)
  Problem: Education: Goal: Knowledge of General Education information will improve Description: Including pain rating scale, medication(s)/side effects and non-pharmacologic comfort measures 01/20/2023 0816 by Emilio Aspen, RN Outcome: Progressing 01/19/2023 1927 by Emilio Aspen, RN Outcome: Progressing   Problem: Health Behavior/Discharge Planning: Goal: Ability to manage health-related needs will improve 01/20/2023 0816 by Emilio Aspen, RN Outcome: Progressing 01/19/2023 1927 by Emilio Aspen, RN Outcome: Progressing   Problem: Clinical Measurements: Goal: Ability to maintain clinical measurements within normal limits will improve 01/20/2023 0816 by Emilio Aspen, RN Outcome: Progressing 01/19/2023 1927 by Emilio Aspen, RN Outcome: Progressing Goal: Will remain free from infection 01/20/2023 0816 by Emilio Aspen, RN Outcome: Progressing 01/19/2023 1927 by Emilio Aspen, RN Outcome: Progressing Goal: Diagnostic test results will improve 01/20/2023 0816 by Emilio Aspen, RN Outcome: Progressing 01/19/2023 1927 by Emilio Aspen, RN Outcome: Progressing Goal: Respiratory complications will improve 01/20/2023 0816 by Emilio Aspen, RN Outcome: Progressing 01/19/2023 1927 by Emilio Aspen, RN Outcome: Progressing Goal: Cardiovascular complication will be avoided 01/20/2023 0816 by Emilio Aspen, RN Outcome: Progressing 01/19/2023 1927 by Emilio Aspen, RN Outcome: Progressing   Problem: Activity: Goal: Risk for activity intolerance will decrease 01/20/2023 0816 by Emilio Aspen, RN Outcome: Progressing 01/19/2023 1927 by Emilio Aspen, RN Outcome: Progressing   Problem: Nutrition: Goal: Adequate nutrition will be maintained 01/20/2023 0816 by Emilio Aspen, RN Outcome: Progressing 01/19/2023 1927 by Emilio Aspen, RN Outcome: Progressing   Problem: Coping: Goal: Level of anxiety will decrease 01/20/2023 0816 by Emilio Aspen, RN Outcome: Progressing 01/19/2023 1927 by Emilio Aspen, RN Outcome: Progressing   Problem: Elimination: Goal: Will not experience complications related to bowel motility 01/20/2023 0816 by Emilio Aspen, RN Outcome: Progressing 01/19/2023 1927 by Emilio Aspen, RN Outcome: Progressing Goal: Will not experience complications related to urinary retention 01/20/2023 0816 by Emilio Aspen, RN Outcome: Progressing 01/19/2023 1927 by Emilio Aspen, RN Outcome: Progressing   Problem: Pain Managment: Goal: General experience of comfort will improve 01/20/2023 0816 by Emilio Aspen, RN Outcome: Progressing 01/19/2023 1927 by Emilio Aspen, RN Outcome: Progressing   Problem: Safety: Goal: Ability to remain free from injury will improve 01/20/2023 0816 by Emilio Aspen, RN Outcome: Progressing 01/19/2023 1927 by Emilio Aspen, RN Outcome: Progressing   Problem: Skin Integrity: Goal: Risk for impaired skin integrity will decrease 01/20/2023 0816 by Emilio Aspen, RN Outcome: Progressing 01/19/2023 1927 by Emilio Aspen, RN Outcome: Progressing

## 2023-01-20 NOTE — Progress Notes (Signed)
Took patient off home vent and placed him back on 35% trach collar without difficulty. Patient performed his trach care. Suctioned airway for white sputum.

## 2023-01-20 NOTE — Progress Notes (Signed)
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   [name]  Mobility Specialist  01/20/23 3:43 PM

## 2023-01-20 NOTE — Progress Notes (Signed)
PROGRESS NOTE    Gary [name]   ZDG:[id number] DOB: Aug 09, 1955  DOA: 11/28/2022 Date of Service: 01/20/23 which is hospital day 51  PCP: Shayne Alken, MD    Gary [name] is a 67 y.o. 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   [name] stated that he ran out of his home torsemide several days ago due to a mixup with his pharmacy.  Then over the last couple days, he has noticed increasing lower extremity swelling, shortness of breath and chest pain.  He states that he usually develops chest pain when he has "fluid on his lungs."    Patient was initially diuresed with IV diuresis, responded well.  Significant improvement in peripheral edema.  IV Lasix was switched with home torsemide once creatinine started increasing.  Patient remained stable from respiratory standpoint point and remained on 6 to 8 L of supplemental oxygen via trach collar which is his baseline.  Patient developed Pseudomonas tracheitis, which was treated with Levaquin as he developed some pain around his tracheostomy.  Patient has end-stage COPD and need to continue follow-up with pulmonary as outpatient  9/25-10/5.  Placement pending. Patient needs a lot of assistance to get out of the bed. Appealing LTACH denial        ASSESSMENT & PLAN:   Acute on chronic diastolic CHF (congestive heart failure) ([name]) Will switch torsemide 40 mg twice a day to Lasix 40 mg IV twice daily to see if we can mobilize more fluid.  Continue spironolactone.   Acute on chronic respiratory failure with hypoxia and hypercapnia ([name]) COPD (chronic obstructive pulmonary disease)  Hypercapnic and Hypoxemic respiratory failure and OSA overlap syndorme  Continue with nebulizer therapy:  Have d/c  pulmicort and steroids Yuperli once daily with albuterol  IS and PT/OT is to be done daily per pulmonary   s/p Metaneb TID with Duoneb Previous plan was to go home w/ home health but current needs are LTAC for 10L O2 and ongoing physical therapy. He lives home alone. Anticipate additional therapy will allow the patient the opportunity to go back home after LTAC.   Continue trach collar 8-10 L.  Continue ventilator at night.  Patient will need prolonged weaning secondary to severe COPD with hypercapnic hypoxic respiratory failure and sleep apnea.  Hopefully he can wean back down to 6 to 8 L on the trach collar but have not been able to wean down to 8L but often needing 10L.  hypertonic saline and mucinex with chest PT to help clear secretions.  Potential to improve w/ appropriate rehab   Mass R upper eyelid - stye vs sebaceous cyst vs other fluid collection See photo below Warm compresses Does not appear infected Will ask ophtho to eval may need removal / drainage  May consider topical numbing and lancing but I am reluctant to approach eye myeslf without guidance from ophthalmology first   Pseudomonas tracheitis resolved  Completed levaquin and bactrim On chronic ppx w/ Bactrim three times per week  Tach site - bacitracin w/ dressing changes, change trach collar weekly Off steroids now   Acute kidney injury superimposed on CKD ([name]) Acute kidney injury on CKD stage IIIa.  Creatinine 1.44 with diuresis.  (On 9/18 did have a creatinine of 1.17).  Monitor with IV diuresis.  Hypotension Off midodrine   OSA (obstructive sleep apnea) Continue home ventilator at night   Chest pain ACS ruled out.   Chronic colitis Continue home mesalamine   Generalized weakness Continue working with physical therapy and mobility specialist to work on getting out of the bed.  Patient able to ambulate once out of the bed. Previous plan was to go home w/ home health but current needs are LTAC for 10L O2  and ongoing physical therapy. He lives home alone. Anticipate additional therapy will allow the patient the opportunity to go back home after LTAC.    Morbid obesity with BMI of 50.0-59.9, adult ([name]) BMI 51.73 with current height and weight in computer.        Morbid obesity based on BMI: Body mass index is 51.73 kg/m.   DVT prophylaxis: lovenox  IV fluids: no continuous IV fluids  Nutrition: cardiac diet w/ fluid restriction Central lines / invasive devices: none  Code Status: FULL CODE ACP documentation reviewed: 01/16/23 and none on file in [facility]  TOC needs: placement Barriers to dispo / significant pending items: placement              Subjective / Brief ROS:  Patient reports bump on his eye has been bothering him now seems to be bigger. Wants it drained.   Family Communication: none at this time     Objective Findings:  Vitals:   01/19/23 1703 01/19/23 1900 01/19/23 2228 01/20/23 0747  BP: 109/62  112/70 (!) 96/55  Pulse: 81  82   Resp: 16  20 18   Temp: 98 F (36.7 C)  98.2 F (36.8 C) 98.2 F (36.8 C)  TempSrc: Oral   Oral  SpO2: 92% 93% 93% 91%  Weight:      Height:        Intake/Output Summary (Last 24 hours) at 01/20/2023 1433 Last data filed at 01/20/2023 1039 Gross per 24 hour  Intake 240 ml  Output 2052 ml  Net -1812 ml   Filed Weights   01/10/23 0500  Weight: (!) 192.8 kg    Examination:  Physical Exam Constitutional:      General: He is not in acute distress.    Appearance: He is obese.  Eyes:     General: Vision grossly intact.     Extraocular Movements: Extraocular movements intact.     Comments: R upper eyelid mass - see photo, no appreciable erythema, no drainage, appears more c/w sebaceous cyst   Pulmonary:     Effort: Pulmonary effort is normal. No accessory muscle usage.  Neurological:     Mental Status: He is alert.  Psychiatric:        Mood and Affect: Mood normal.        Behavior: Behavior normal.           Scheduled Medications:   acidophilus  2 capsule Oral BID   arformoterol  15 mcg Nebulization BID   bacitracin   Topical Daily   docusate sodium  100 mg Oral Daily   enoxaparin (LOVENOX) injection  100 mg Subcutaneous Q24H   ferrous sulfate  325 mg Oral Q breakfast   furosemide  40 mg Intravenous BID   Gerhardt's butt cream   Topical Daily   guaiFENesin  1,200 mg Oral BID   Ipratropium-Albuterol  1 puff Inhalation Q6H   Mesalamine  800 mg Oral BID   revefenacin  175 mcg Nebulization Daily   sodium chloride HYPERTONIC  4 mL Nebulization BID   spironolactone  25 mg Oral Daily    Continuous Infusions:   PRN Medications:  acetaminophen, albuterol, hydrOXYzine, ondansetron (ZOFRAN) IV, mouth rinse, trolamine salicylate  Antimicrobials from admission:  Anti-infectives (From admission, onward)    Start     Dose/Rate Route Frequency Ordered Stop   01/02/23 0900  sulfamethoxazole-trimethoprim (BACTRIM) 400-80 MG per tablet 1 tablet  Status:  Discontinued        1 tablet Oral Once per day on Monday Wednesday Friday 01/01/23 1207 01/12/23 1002   12/03/22 1615  levofloxacin (LEVAQUIN) tablet 750 mg        750 mg Oral Daily 12/03/22 1520 12/07/22 0831           Data Reviewed:  I have personally reviewed the following...  CBC: Recent Labs  Lab 01/15/23 0842  WBC 7.7  HGB 10.3*  HCT 39.2  MCV 79.2*  PLT 221   Basic Metabolic Panel: Recent Labs  Lab 01/14/23 0754 01/16/23 0818  NA 139 134*  K 4.0 3.9  CL 98 97*  CO2 33* 32  GLUCOSE 114* 147*  BUN 33* 40*  CREATININE 1.44* 1.54*  CALCIUM 8.2* 8.1*   GFR: Estimated Creatinine Clearance: 87.4 mL/min (A) (by C-G formula based on SCr of 1.54 mg/dL (H)). Liver Function Tests: No results for input(s): "AST", "ALT", "ALKPHOS", "BILITOT", "PROT", "ALBUMIN" in the last 168 hours. No results for input(s): "LIPASE", "AMYLASE" in the last 168 hours. No results for input(s): "AMMONIA" in the last 168  hours. Coagulation Profile: No results for input(s): "INR", "PROTIME" in the last 168 hours. Cardiac Enzymes: No results for input(s): "CKTOTAL", "CKMB", "CKMBINDEX", "TROPONINI" in the last 168 hours. BNP (last 3 results) No results for input(s): "PROBNP" in the last 8760 hours. HbA1C: No results for input(s): "HGBA1C" in the last 72 hours. CBG: No results for input(s): "GLUCAP" in the last 168 hours. Lipid Profile: No results for input(s): "CHOL", "HDL", "LDLCALC", "TRIG", "CHOLHDL", "LDLDIRECT" in the last 72 hours. Thyroid Function Tests: No results for input(s): "TSH", "T4TOTAL", "FREET4", "T3FREE", "THYROIDAB" in the last 72 hours. Anemia Panel: No results for input(s): "VITAMINB12", "FOLATE", "FERRITIN", "TIBC", "IRON", "RETICCTPCT" in the last 72 hours. Most Recent Urinalysis On File:     Component Value Date/Time   COLORURINE YELLOW 11/07/2022 2151   APPEARANCEUR CLEAR 11/07/2022 2151   APPEARANCEUR Clear 04/15/2014 1240   LABSPEC 1.013 11/07/2022 2151   LABSPEC 1.013 04/15/2014 1240   PHURINE 5.0 11/07/2022 2151   GLUCOSEU NEGATIVE 11/07/2022 2151   GLUCOSEU Negative 04/15/2014 1240   HGBUR NEGATIVE 11/07/2022 2151   BILIRUBINUR NEGATIVE 11/07/2022 2151   BILIRUBINUR Negative 04/15/2014 1240   KETONESUR NEGATIVE 11/07/2022 2151   PROTEINUR NEGATIVE 11/07/2022 2151   NITRITE NEGATIVE 11/07/2022 2151   LEUKOCYTESUR NEGATIVE 11/07/2022 2151   LEUKOCYTESUR Negative 04/15/2014 1240   Sepsis Labs: @LABRCNTIP (procalcitonin:4,lacticidven:4) Microbiology: No results found for this or any previous visit (from the past 240 hour(s)).    Radiology Studies last 3 days: No results found.        Sunnie Nielsen, DO [facility] 01/20/2023, 2:33 PM    Dictation software may have been used to generate the above note. Typos may occur and escape review in typed/dictated notes. Please contact Dr Lyn Hollingshead directly for clarity if needed.  Staff may message me  via secure chat in Epic  but this may not receive an immediate response,  please page me for urgent matters!  If 7PM-7AM, please contact night coverage [email or web address]

## 2023-01-21 DIAGNOSIS — J9601 Acute respiratory failure with hypoxia: Secondary | ICD-10-CM | POA: Diagnosis not present

## 2023-01-21 DIAGNOSIS — J9602 Acute respiratory failure with hypercapnia: Secondary | ICD-10-CM | POA: Diagnosis not present

## 2023-01-21 DIAGNOSIS — J441 Chronic obstructive pulmonary disease with (acute) exacerbation: Secondary | ICD-10-CM | POA: Diagnosis not present

## 2023-01-21 DIAGNOSIS — E8770 Fluid overload, unspecified: Secondary | ICD-10-CM | POA: Diagnosis not present

## 2023-01-21 DIAGNOSIS — I5033 Acute on chronic diastolic (congestive) heart failure: Secondary | ICD-10-CM | POA: Diagnosis not present
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
## 2023-01-21 NOTE — Plan of Care (Signed)

## 2023-01-21 NOTE — Progress Notes (Signed)
Patient seen to assist in taking off home vent.  Patient elected to stay on a little longer to rest. No distress noted. Awakes to voice. Updated RN.

## 2023-01-21 NOTE — Care Management Important Message (Signed)
Important Message  Patient Details  Name: Gary [name] MRN: [id number] Date of Birth: 07-Nov-1955   Important Message Given:  Yes - Medicare IM     Olegario Messier A Deron Poole 01/21/2023, 1:49 PM

## 2023-01-21 NOTE — Progress Notes (Signed)
Occupational Therapy Treatment Patient Details Name: Gary [name] MRN: [id number] DOB: 12/19/1955 Today's Date: 01/21/2023   History of present illness 67 y/o male presented to [facility] ED on 11/28/22 for chest pain x 4 days. Admitted for acute on chronic CHF. Frequent admissions this year with most recent dsicharge on 11/21/22. PMH includes obesity, hypoxia, respiratory failure with tracheostomy, COPD, GIB, HFpEF.   OT comments  Gary [name] was seen for OT treatment on this date. Upon arrival to room pt in bed, reports just completing  Ophthalmology session; defers mobility but agreeable to bed level therex. MOD I for bed mobility. Reviewed HEP. Completed therex with green theraband as described below. Pt making progress toward goals, will continue to follow POC. Discharge recommendation remains appropriate.       If plan is discharge home, recommend the following:  A little help with walking and/or transfers;A lot of help with bathing/dressing/bathroom   Equipment Recommendations  None recommended by OT    Recommendations for Other Services      Precautions / Restrictions Precautions Precautions: Fall Precaution Comments: trach collar/ Trach Restrictions Weight Bearing Restrictions: No       Mobility Bed Mobility Overal bed mobility: Modified Independent                                         ADL either performed or assessed with clinical judgement   ADL Overall ADL's : Needs assistance/impaired                                       General ADL Comments: SETUP bed level      Cognition Arousal: Alert Behavior During Therapy: WFL for tasks assessed/performed Overall Cognitive Status: Within Functional Limits for tasks assessed                                          Exercises Exercises: General Upper Extremity General Exercises - Upper Extremity Shoulder Flexion: AROM, Strengthening, Both, 20 reps, Supine,  Theraband Theraband Level (Shoulder Flexion): Level 3 (Green) Shoulder Extension: AROM, Strengthening, Both, 20 reps, Supine, Theraband Theraband Level (Shoulder Extension): Level 3 (Green) Shoulder Horizontal ABduction: AROM, Strengthening, Both, 20 reps, Supine, Theraband Theraband Level (Shoulder Horizontal Abduction): Level 3 (Green) Shoulder Horizontal ADduction: AROM, Strengthening, Both, 20 reps, Supine, Theraband Theraband Level (Shoulder Horizontal Adduction): Level 3 (Green) Elbow Flexion: AROM, Strengthening, Both, 20 reps, Supine, Theraband Theraband Level (Elbow Flexion): Level 3 (Green) Elbow Extension: AROM, Strengthening, Both, 20 reps, Supine, Theraband Theraband Level (Elbow Extension): Level 3 (Green)            Pertinent Vitals/ Pain       Pain Assessment Pain Assessment: No/denies pain   Frequency  Min 1X/week        Progress Toward Goals  OT Goals(current goals can now be found in the care plan section)  Progress towards OT goals: Progressing toward goals  Acute Rehab OT Goals Patient Stated Goal: get stronger OT Goal Formulation: With patient Time For Goal Achievement: 01/28/23 Potential to Achieve Goals: Good ADL Goals Pt Will Perform Grooming: with modified independence;standing Pt Will Transfer to Toilet: with modified independence;ambulating;regular height toilet Pt Will Perform Toileting - Clothing Manipulation and hygiene: with  mod assist;sit to/from stand  Plan Discharge plan remains appropriate;Frequency remains appropriate    Co-evaluation                 AM-PAC OT "6 Clicks" Daily Activity     Outcome Measure   Help from another person eating meals?: None Help from another person taking care of personal grooming?: A Little Help from another person toileting, which includes using toliet, bedpan, or urinal?: A Lot Help from another person bathing (including washing, rinsing, drying)?: A Lot Help from another person to put on  and taking off regular upper body clothing?: None Help from another person to put on and taking off regular lower body clothing?: A Lot 6 Click Score: 17    End of Session    OT Visit Diagnosis: Unsteadiness on feet (R26.81);Other abnormalities of gait and mobility (R26.89);Muscle weakness (generalized) (M62.81);Adult, failure to thrive (R62.7)   Activity Tolerance Patient tolerated treatment well   Patient Left in bed;with call bell/phone within reach   Nurse Communication          Time: 1331-1340 OT Time Calculation (min): 9 min  Charges: OT General Charges $OT Visit: 1 Visit OT Treatments $Therapeutic Exercise: 8-22 mins  Kathie Dike, M.S. OTR/L  01/21/23, 3:00 PM  ascom [phone]

## 2023-01-21 NOTE — Progress Notes (Signed)
PROGRESS NOTE    Gary [name]   ZOX:[id number] DOB: 01-11-1956  DOA: 11/28/2022 Date of Service: 01/21/23 which is hospital day 52  PCP: Shayne Alken, MD    Gary [name] is a 67 y.o. 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   Gary [name] stated that he ran out of his home torsemide several days ago due to a mixup with his pharmacy.  Then over the last couple days, he has noticed increasing lower extremity swelling, shortness of breath and chest pain.  He states that he usually develops chest pain when he has "fluid on his lungs."    Patient was initially diuresed with IV diuresis, responded well.  Significant improvement in peripheral edema.  IV Lasix was switched with home torsemide once creatinine started increasing.  Patient remained stable from respiratory standpoint point and remained on 6 to 8 L of supplemental oxygen via trach collar which is his baseline.  Patient developed Pseudomonas tracheitis, which was treated with Levaquin as he developed some pain around his tracheostomy.  Patient has end-stage COPD and need to continue follow-up with pulmonary as outpatient  9/25-10/7.  Placement pending. Patient needs a lot of assistance to get out of the bed. 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  Continue spironolactone.   Acute on chronic respiratory failure with hypoxia and hypercapnia ([name]) COPD (chronic obstructive pulmonary disease)  Hypercapnic and Hypoxemic respiratory failure and  OSA overlap syndorme  Continue with nebulizer therapy:  Have d/c pulmicort and steroids Yuperli once daily with albuterol  IS and PT/OT is to be done daily per pulmonary   s/p Metaneb TID with Duoneb Previous plan was to go home w/ home health but current needs are LTAC for 10L O2 and ongoing physical therapy. He lives home alone. Anticipate additional therapy will allow the patient the opportunity to go back home after LTAC.   Continue trach collar 8-10 L.  Continue ventilator at night.  Patient will need prolonged weaning secondary to severe COPD with hypercapnic hypoxic respiratory failure and sleep apnea.  Hopefully he can wean back down to 6 to 8 L on the trach collar but have not been able to wean down to 8L but often needing 10L.  hypertonic saline and mucinex with chest PT to help clear secretions.  Potential to improve w/ appropriate rehab   Mass R upper eyelid - stye vs sebaceous cyst vs other fluid collection See photo below Ophthalmology eval/tx   Pseudomonas tracheitis resolved  Completed levaquin and bactrim On chronic ppx w/ Bactrim three times per week  Tach site - bacitracin w/ dressing changes, change trach collar weekly Off steroids now   Acute kidney injury superimposed on CKD ([name]) Acute kidney injury on CKD stage IIIa.  Creatinine 1.44 with diuresis.  (On 9/18 did have a creatinine of 1.17).  Monitor with IV diuresis.   Hypotension Off midodrine   OSA (obstructive sleep apnea) Continue home ventilator at night   Chest pain ACS ruled out.  Chronic colitis Continue home mesalamine   Generalized weakness Continue working with physical therapy and mobility specialist to work on getting out of the bed.  Patient able to ambulate once out of the bed. Previous plan was to go home w/ home health but current needs are LTAC for 10L O2 and ongoing physical therapy. He lives home alone. Anticipate additional therapy will allow the patient the opportunity to go back  home after LTAC.    Morbid obesity with BMI of 50.0-59.9, adult ([name]) BMI 51.73 with current height and weight in computer.        Morbid obesity based on BMI: Body mass index is 51.73 kg/m.   DVT prophylaxis: lovenox  IV fluids: no continuous IV fluids  Nutrition: cardiac diet w/ fluid restriction Central lines / invasive devices: none  Code Status: FULL CODE ACP documentation reviewed: 01/16/23 and none on file in [facility]  TOC needs: placement Barriers to dispo / significant pending items: placement              Subjective / Brief ROS:  Patient reports bump on his eye has been bothering him now seems to be bigger. Wants it drained or removed would like to d/w ophtho    Family Communication: none at this time     Objective Findings:  Vitals:   01/20/23 1959 01/21/23 0609 01/21/23 0740 01/21/23 0809  BP:    108/80  Pulse:   80 81  Resp:   18 14  Temp:    97.9 F (36.6 C)  TempSrc:      SpO2: 92% 93% 95% 96%  Weight:      Height:        Intake/Output Summary (Last 24 hours) at 01/21/2023 1259 Last data filed at 01/20/2023 1848 Gross per 24 hour  Intake 240 ml  Output 750 ml  Net -510 ml   Filed Weights   01/10/23 0500  Weight: (!) 192.8 kg    Examination:  Physical Exam Constitutional:      General: He is not in acute distress.    Appearance: He is obese.  Eyes:     General: Vision grossly intact.     Extraocular Movements: Extraocular movements intact.     Comments: R upper eyelid mass - see photo, no appreciable erythema, no drainage, appears more c/w sebaceous cyst   Pulmonary:     Effort: Pulmonary effort is normal. No accessory muscle usage.  Neurological:     Mental Status: He is alert.  Psychiatric:        Mood and Affect: Mood normal.        Behavior: Behavior normal.          Scheduled Medications:   acidophilus  2 capsule Oral BID   arformoterol  15 mcg Nebulization BID   bacitracin   Topical Daily   docusate sodium   100 mg Oral Daily   enoxaparin (LOVENOX) injection  100 mg Subcutaneous Q24H   ferrous sulfate  325 mg Oral Q breakfast   furosemide  40 mg Intravenous BID   Gerhardt's butt cream   Topical Daily   guaiFENesin  1,200 mg Oral BID   Ipratropium-Albuterol  1 puff Inhalation Q6H   Mesalamine  800 mg Oral BID   revefenacin  175 mcg Nebulization Daily   sodium chloride HYPERTONIC  4 mL Nebulization BID   spironolactone  25 mg Oral Daily    Continuous Infusions:   PRN Medications:  acetaminophen, albuterol, hydrOXYzine, ondansetron (ZOFRAN) IV, mouth  rinse, trolamine salicylate  Antimicrobials from admission:  Anti-infectives (From admission, onward)    Start     Dose/Rate Route Frequency Ordered Stop   01/02/23 0900  sulfamethoxazole-trimethoprim (BACTRIM) 400-80 MG per tablet 1 tablet  Status:  Discontinued        1 tablet Oral Once per day on Monday Wednesday Friday 01/01/23 1207 01/12/23 1002   12/03/22 1615  levofloxacin (LEVAQUIN) tablet 750 mg        750 mg Oral Daily 12/03/22 1520 12/07/22 0831           Data Reviewed:  I have personally reviewed the following...  CBC: Recent Labs  Lab 01/15/23 0842  WBC 7.7  HGB 10.3*  HCT 39.2  MCV 79.2*  PLT 221   Basic Metabolic Panel: Recent Labs  Lab 01/16/23 0818  NA 134*  K 3.9  CL 97*  CO2 32  GLUCOSE 147*  BUN 40*  CREATININE 1.54*  CALCIUM 8.1*   GFR: Estimated Creatinine Clearance: 87.4 mL/min (A) (by C-G formula based on SCr of 1.54 mg/dL (H)). Liver Function Tests: No results for input(s): "AST", "ALT", "ALKPHOS", "BILITOT", "PROT", "ALBUMIN" in the last 168 hours. No results for input(s): "LIPASE", "AMYLASE" in the last 168 hours. No results for input(s): "AMMONIA" in the last 168 hours. Coagulation Profile: No results for input(s): "INR", "PROTIME" in the last 168 hours. Cardiac Enzymes: No results for input(s): "CKTOTAL", "CKMB", "CKMBINDEX", "TROPONINI" in the last 168 hours. BNP (last 3  results) No results for input(s): "PROBNP" in the last 8760 hours. HbA1C: No results for input(s): "HGBA1C" in the last 72 hours. CBG: No results for input(s): "GLUCAP" in the last 168 hours. Lipid Profile: No results for input(s): "CHOL", "HDL", "LDLCALC", "TRIG", "CHOLHDL", "LDLDIRECT" in the last 72 hours. Thyroid Function Tests: No results for input(s): "TSH", "T4TOTAL", "FREET4", "T3FREE", "THYROIDAB" in the last 72 hours. Anemia Panel: No results for input(s): "VITAMINB12", "FOLATE", "FERRITIN", "TIBC", "IRON", "RETICCTPCT" in the last 72 hours. Most Recent Urinalysis On File:     Component Value Date/Time   COLORURINE YELLOW 11/07/2022 2151   APPEARANCEUR CLEAR 11/07/2022 2151   APPEARANCEUR Clear 04/15/2014 1240   LABSPEC 1.013 11/07/2022 2151   LABSPEC 1.013 04/15/2014 1240   PHURINE 5.0 11/07/2022 2151   GLUCOSEU NEGATIVE 11/07/2022 2151   GLUCOSEU Negative 04/15/2014 1240   HGBUR NEGATIVE 11/07/2022 2151   BILIRUBINUR NEGATIVE 11/07/2022 2151   BILIRUBINUR Negative 04/15/2014 1240   KETONESUR NEGATIVE 11/07/2022 2151   PROTEINUR NEGATIVE 11/07/2022 2151   NITRITE NEGATIVE 11/07/2022 2151   LEUKOCYTESUR NEGATIVE 11/07/2022 2151   LEUKOCYTESUR Negative 04/15/2014 1240   Sepsis Labs: @LABRCNTIP (procalcitonin:4,lacticidven:4) Microbiology: No results found for this or any previous visit (from the past 240 hour(s)).    Radiology Studies last 3 days: No results found.        Sunnie Nielsen, DO [facility] 01/21/2023, 12:59 PM    Dictation software may have been used to generate the above note. Typos may occur and escape review in typed/dictated notes. Please contact Dr Lyn Hollingshead directly for clarity if needed.  Staff may message me via secure chat in Epic  but this may not receive an immediate response,  please page me for urgent matters!  If 7PM-7AM, please contact night coverage [email or web address]

## 2023-01-22 DIAGNOSIS — I5033 Acute on chronic diastolic (congestive) heart failure: Secondary | ICD-10-CM | POA: Diagnosis not present

## 2023-01-22 DIAGNOSIS — J9601 Acute respiratory failure with hypoxia: Secondary | ICD-10-CM | POA: Diagnosis not present

## 2023-01-22 DIAGNOSIS — J441 Chronic obstructive pulmonary disease with (acute) exacerbation: Secondary | ICD-10-CM | POA: Diagnosis not present

## 2023-01-22 DIAGNOSIS — E8770 Fluid overload, unspecified: Secondary | ICD-10-CM | POA: Diagnosis not present

## 2023-01-22 DIAGNOSIS — J9602 Acute respiratory failure with hypercapnia: Secondary | ICD-10-CM | POA: Diagnosis not present

## 2023-01-22 LAB — CBC
HCT: 39.4 % (ref 39.0–52.0)
Hemoglobin: 10 g/dL — ABNORMAL LOW (ref 13.0–17.0)
MCH: 20.8 pg — ABNORMAL LOW (ref 26.0–34.0)
MCHC: 25.4 g/dL — ABNORMAL LOW (ref 30.0–36.0)
MCV: 81.9 fL (ref 80.0–100.0)
Platelets: 199 10*3/uL (ref 150–400)
RBC: 4.81 MIL/uL (ref 4.22–5.81)
RDW: 20 % — ABNORMAL HIGH (ref 11.5–15.5)
WBC: 6.9 10*3/uL (ref 4.0–10.5)
nRBC: 1.3 % — ABNORMAL HIGH (ref 0.0–0.2)

## 2023-01-22 MED ORDER — OXYCODONE HCL 5 MG PO TABS: 5.0000 mg | ORAL_TABLET | Freq: Once | ORAL | Status: DC
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
## 2023-01-22 NOTE — TOC Progression Note (Signed)
Transition of Care [facility]) - Progression Note    Patient Details  Name: Gary [name] MRN: [id number] Date of Birth: 19-Jun-1955  Transition of Care [facility]) CM/SW Contact  Marlowe Sax, RN Phone Number: 01/22/2023, 11:46 AM  Clinical Narrative:    Appeal was lost, the patient will not be approved to go to LTAC, no SNF will accept with 10 liters of O2   Expected Discharge Plan: Home w Home Health Services Barriers to Discharge: Continued Medical Work up  Expected Discharge Plan and Services     Post Acute Care Choice: Home Health                   DME Arranged: Bedside commode DME Agency: [employer] Date DME Agency Contacted: 01/12/23 Time DME Agency Contacted: 1427 Representative spoke with at DME Agency: [facility] Arranged: PT, OT HH Agency: [facility] Date [facility] Agency Contacted: 01/12/23 Time HH Agency Contacted: 1428 Representative spoke with at [facility] Agency: Kandee Keen   Social Determinants of Health (SDOH) Interventions SDOH Screenings   Food Insecurity: No Food Insecurity (12/01/2022)  Housing: Patient Declined (12/01/2022)  Transportation Needs: No Transportation Needs (12/01/2022)  Utilities: Not At Risk (12/01/2022)  Depression (PHQ2-9): Low Risk  (06/07/2021)  Financial Resource Strain: High Risk (11/21/2022)   Received from [facility]  Physical Activity: Insufficiently Active (05/03/2017)  Social Connections: Patient Declined (11/20/2022)   Received from [facility]  Stress: Stress Concern Present (11/20/2022)   Received from [facility]  Tobacco Use: Medium Risk (12/01/2022)    Readmission Risk Interventions    11/05/2022    2:59 PM 11/05/2022   12:16 PM 12/31/2021    8:58 AM  Readmission Risk Prevention Plan  Transportation Screening Complete Complete Complete  Medication Review Oceanographer) Complete Complete Complete  PCP or Specialist appointment within 3-5 days of discharge Complete Complete Complete  HRI or Home Care Consult Complete  Complete Complete  SW Recovery Care/Counseling Consult Complete Complete Complete  Palliative Care Screening Complete Complete Complete  Skilled Nursing Facility Not Applicable Not Applicable Not Applicable

## 2023-01-22 NOTE — Progress Notes (Signed)
Mobility Specialist - Progress Note   01/22/23 1418  Mobility  Activity Dangled on edge of bed;Stood at bedside;Ambulated with assistance in room  Level of Assistance Standby assist, set-up cues, supervision of patient - no hands on  Assistive Device Front wheel walker  Distance Ambulated (ft) 12 ft  RLE Weight Bearing WBAT  Activity Response Tolerated well  $Mobility charge 1 Mobility  Mobility Specialist Start Time (ACUTE ONLY) 1350  Mobility Specialist Stop Time (ACUTE ONLY) 1412  Mobility Specialist Time Calculation (min) (ACUTE ONLY) 22 min   Pt in fowler position upon entry, agreeable to amb and transfer to the recliner this date. Pt completed bed mob ModI-- heavy use of bed functions to bring BLE EOB. Pt dangled EOB for ~2 mins to "catch my breath", Pt explained how he feels SOB during most movements. Pt STS to RW and amb 12 ft in the hallway with supervision, expressing Bilateral knee weakness opting to sit in the recliner and be wheeled back into the room. Pt left seated in the recliner with needs within reach.  Zetta Bills Mobility Specialist 01/22/23 2:24 PM

## 2023-01-22 NOTE — Plan of Care (Signed)

## 2023-01-22 NOTE — Plan of Care (Signed)
  Problem: Clinical Measurements: Goal: Will remain free from infection Outcome: Progressing   Problem: Clinical Measurements: Goal: Ability to maintain clinical measurements within normal limits will improve Outcome: Progressing   Problem: Safety: Goal: Ability to remain free from injury will improve Outcome: Progressing

## 2023-01-22 NOTE — Progress Notes (Signed)
PROGRESS NOTE    Gary [name]   QMV:[id number] DOB: 1955/09/11  DOA: 11/28/2022 Date of Service: 01/22/23 which is hospital day 53  PCP: Shayne Alken, MD    Gary [name] is a 67 y.o. 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   Gary [name] stated that he ran out of his home torsemide several days ago due to a mixup with his pharmacy.  Then over the last couple days, he has noticed increasing lower extremity swelling, shortness of breath and chest pain.  He states that he usually develops chest pain when he has "fluid on his lungs."    Patient was initially diuresed with IV diuresis, responded well.  Significant improvement in peripheral edema.  IV Lasix was switched with home torsemide once creatinine started increasing.  Patient remained stable from respiratory standpoint point and remained on 6 to 8 L of supplemental oxygen via trach collar which is his baseline.  Patient developed Pseudomonas tracheitis, which was treated with Levaquin as he developed some pain around his tracheostomy.  Patient has end-stage COPD and need to continue follow-up with pulmonary as outpatient  9/25-10/7.  Placement pending. Patient needs a lot of assistance to get out of the bed. Appealing LTACH denial   10/07: ophthalmology saw him today and Dr. Brooke Dare drained hydrocystoma on R eyelid - note ispending but Dr Brooke Dare communicated directly w/ hospitalist       ASSESSMENT & PLAN:   Acute on chronic diastolic CHF (congestive heart failure) ([name]) Will switch torsemide 40 mg twice a day to Lasix 40 mg IV twice daily to see if we can mobilize more fluid.  Continue spironolactone.   Acute on chronic respiratory failure with hypoxia and hypercapnia ([name]) COPD (chronic  obstructive pulmonary disease)  Hypercapnic and Hypoxemic respiratory failure and OSA overlap syndorme  Continue with nebulizer therapy:  Have d/c pulmicort and steroids Yuperli once daily with albuterol  IS and PT/OT is to be done daily per pulmonary   s/p Metaneb TID with Duoneb Previous plan was to go home w/ home health but current needs are LTAC for 10L O2 and ongoing physical therapy. He lives home alone. Anticipate additional therapy will allow the patient the opportunity to go back home after LTAC.   Continue trach collar 8-10 L.  Continue ventilator at night.  Patient will need prolonged weaning secondary to severe COPD with hypercapnic hypoxic respiratory failure and sleep apnea.  Hopefully he can wean back down to 6 to 8 L on the trach collar but have not been able to wean down to 8L but often needing 10L.  hypertonic saline and mucinex with chest PT to help clear secretions.  Potential to improve w/ appropriate rehab   Mass R upper eyelid - stye vs sebaceous cyst vs other fluid collection See photo below Ophthalmology drained this 10/07  Pseudomonas tracheitis resolved  Completed levaquin and bactrim On chronic ppx w/ Bactrim three times per week  Tach site - bacitracin w/ dressing changes, change trach collar weekly Off steroids now   Acute kidney injury superimposed on CKD ([name]) Acute kidney injury on CKD stage IIIa.  Creatinine 1.44 with diuresis.  (On 9/18 did have a creatinine of 1.17).  Monitor with IV diuresis.   Hypotension Off midodrine   OSA (obstructive  sleep apnea) Continue home ventilator at night   Chest pain ACS ruled out.   Chronic colitis Continue home mesalamine   Generalized weakness Continue working with physical therapy and mobility specialist to work on getting out of the bed.  Patient able to ambulate once out of the bed. Previous plan was to go home w/ home health but current needs are LTAC for 10L O2 and ongoing physical therapy. He lives  home alone. Anticipate additional therapy will allow the patient the opportunity to go back home after LTAC.    Morbid obesity with BMI of 50.0-59.9, adult ([name]) BMI 51.73 with current height and weight in computer.        Morbid obesity based on BMI: Body mass index is 51.73 kg/m.   DVT prophylaxis: lovenox  IV fluids: no continuous IV fluids  Nutrition: cardiac diet w/ fluid restriction Central lines / invasive devices: none  Code Status: FULL CODE ACP documentation reviewed: 01/16/23 and none on file in [facility]  TOC needs: placement Barriers to dispo / significant pending items: placement              Subjective / Brief ROS:  Patient reports bump on his eye has been bothering him now seems to be bigger. Wants it drained or removed would like to d/w ophtho    Family Communication: none at this time     Objective Findings:  Vitals:   01/22/23 0754 01/22/23 0812 01/22/23 0814 01/22/23 1100  BP: (!) 99/58     Pulse: 80  77   Resp: 14  16 16   Temp: 98.4 F (36.9 C)     TempSrc:      SpO2: 94% 92% 92%   Weight:      Height:        Intake/Output Summary (Last 24 hours) at 01/22/2023 1258 Last data filed at 01/22/2023 0900 Gross per 24 hour  Intake 720 ml  Output 1500 ml  Net -780 ml   Filed Weights   01/10/23 0500  Weight: (!) 192.8 kg    Examination:  Physical Exam Constitutional:      General: He is not in acute distress.    Appearance: He is obese.  Eyes:     General: Vision grossly intact.     Extraocular Movements: Extraocular movements intact.     Comments: R upper eyelid mass - has been drained, loose skin present but no mass/swelling, no erythema   Pulmonary:     Effort: Pulmonary effort is normal. No accessory muscle usage.  Neurological:     Mental Status: He is alert.  Psychiatric:        Mood and Affect: Mood normal.        Behavior: Behavior normal.          Scheduled Medications:   acidophilus  2 capsule Oral BID    arformoterol  15 mcg Nebulization BID   bacitracin   Topical Daily   docusate sodium  100 mg Oral Daily   enoxaparin (LOVENOX) injection  100 mg Subcutaneous Q24H   ferrous sulfate  325 mg Oral Q breakfast   furosemide  40 mg Intravenous BID   Gerhardt's butt cream   Topical Daily   guaiFENesin  1,200 mg Oral BID   Ipratropium-Albuterol  1 puff Inhalation Q6H   Mesalamine  800 mg Oral BID   oxyCODONE  5 mg Oral Once   revefenacin  175 mcg Nebulization Daily   spironolactone  25 mg Oral Daily  Continuous Infusions:   PRN Medications:  acetaminophen, albuterol, hydrOXYzine, ondansetron (ZOFRAN) IV, mouth rinse, trolamine salicylate  Antimicrobials from admission:  Anti-infectives (From admission, onward)    Start     Dose/Rate Route Frequency Ordered Stop   01/02/23 0900  sulfamethoxazole-trimethoprim (BACTRIM) 400-80 MG per tablet 1 tablet  Status:  Discontinued        1 tablet Oral Once per day on Monday Wednesday Friday 01/01/23 1207 01/12/23 1002   12/03/22 1615  levofloxacin (LEVAQUIN) tablet 750 mg        750 mg Oral Daily 12/03/22 1520 12/07/22 0831           Data Reviewed:  I have personally reviewed the following...  CBC: Recent Labs  Lab 01/22/23 0846  WBC 6.9  HGB 10.0*  HCT 39.4  MCV 81.9  PLT 199   Basic Metabolic Panel: Recent Labs  Lab 01/16/23 0818  NA 134*  K 3.9  CL 97*  CO2 32  GLUCOSE 147*  BUN 40*  CREATININE 1.54*  CALCIUM 8.1*   GFR: Estimated Creatinine Clearance: 87.4 mL/min (A) (by C-G formula based on SCr of 1.54 mg/dL (H)). Liver Function Tests: No results for input(s): "AST", "ALT", "ALKPHOS", "BILITOT", "PROT", "ALBUMIN" in the last 168 hours. No results for input(s): "LIPASE", "AMYLASE" in the last 168 hours. No results for input(s): "AMMONIA" in the last 168 hours. Coagulation Profile: No results for input(s): "INR", "PROTIME" in the last 168 hours. Cardiac Enzymes: No results for input(s): "CKTOTAL", "CKMB",  "CKMBINDEX", "TROPONINI" in the last 168 hours. BNP (last 3 results) No results for input(s): "PROBNP" in the last 8760 hours. HbA1C: No results for input(s): "HGBA1C" in the last 72 hours. CBG: No results for input(s): "GLUCAP" in the last 168 hours. Lipid Profile: No results for input(s): "CHOL", "HDL", "LDLCALC", "TRIG", "CHOLHDL", "LDLDIRECT" in the last 72 hours. Thyroid Function Tests: No results for input(s): "TSH", "T4TOTAL", "FREET4", "T3FREE", "THYROIDAB" in the last 72 hours. Anemia Panel: No results for input(s): "VITAMINB12", "FOLATE", "FERRITIN", "TIBC", "IRON", "RETICCTPCT" in the last 72 hours. Most Recent Urinalysis On File:     Component Value Date/Time   COLORURINE YELLOW 11/07/2022 2151   APPEARANCEUR CLEAR 11/07/2022 2151   APPEARANCEUR Clear 04/15/2014 1240   LABSPEC 1.013 11/07/2022 2151   LABSPEC 1.013 04/15/2014 1240   PHURINE 5.0 11/07/2022 2151   GLUCOSEU NEGATIVE 11/07/2022 2151   GLUCOSEU Negative 04/15/2014 1240   HGBUR NEGATIVE 11/07/2022 2151   BILIRUBINUR NEGATIVE 11/07/2022 2151   BILIRUBINUR Negative 04/15/2014 1240   KETONESUR NEGATIVE 11/07/2022 2151   PROTEINUR NEGATIVE 11/07/2022 2151   NITRITE NEGATIVE 11/07/2022 2151   LEUKOCYTESUR NEGATIVE 11/07/2022 2151   LEUKOCYTESUR Negative 04/15/2014 1240   Sepsis Labs: @LABRCNTIP (procalcitonin:4,lacticidven:4) Microbiology: No results found for this or any previous visit (from the past 240 hour(s)).    Radiology Studies last 3 days: No results found.        Sunnie Nielsen, DO [facility] 01/22/2023, 12:58 PM    Dictation software may have been used to generate the above note. Typos may occur and escape review in typed/dictated notes. Please contact Dr Lyn Hollingshead directly for clarity if needed.  Staff may message me via secure chat in Epic  but this may not receive an immediate response,  please page me for urgent matters!  If 7PM-7AM, please contact night  coverage [email or web address]
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
## 2023-01-22 NOTE — Procedures (Signed)
R/B/A were discussed, including reformation or recurrence of eyelid cysts.  Eye lid was prepped with alcohol wipes.  A sterile 19 ga needle was used to pierce each of the temporal right upper eyelid cysts.  Sterile gauze pads were used to express a clear liquid.  There was no blood loss.  There were no complications.  No additional restrictions, bandages or dressings are required.

## 2023-01-22 NOTE — Progress Notes (Signed)
PT Cancellation Note  Patient Details Name: Gary [name] MRN: [id number] DOB: 1955-06-10   Cancelled Treatment:    Reason Eval/Treat Not Completed: Other (comment) (Pt sleeping upon 1st attempt, on 2nd attempt pt was finishing a session with mobility specialists and getting positioned for comfort in chair. Will attempt treatment again as time permits.)  2:13 PM, 01/22/23 Rosamaria Lints, PT, DPT Physical Therapist - [facility]  [phone] (ASCOM)    Afra Tricarico C 01/22/2023, 2:13 PM

## 2023-01-23 ENCOUNTER — Inpatient Hospital Stay: Payer: Medicare HMO

## 2023-01-23 DIAGNOSIS — H02823 Cysts of right eye, unspecified eyelid: Secondary | ICD-10-CM

## 2023-01-23 DIAGNOSIS — R0602 Shortness of breath: Secondary | ICD-10-CM | POA: Diagnosis not present

## 2023-01-23 DIAGNOSIS — R531 Weakness: Secondary | ICD-10-CM | POA: Diagnosis not present

## 2023-01-23 DIAGNOSIS — N179 Acute kidney failure, unspecified: Secondary | ICD-10-CM | POA: Diagnosis not present

## 2023-01-23 DIAGNOSIS — G4733 Obstructive sleep apnea (adult) (pediatric): Secondary | ICD-10-CM | POA: Diagnosis not present

## 2023-01-23 DIAGNOSIS — R918 Other nonspecific abnormal finding of lung field: Secondary | ICD-10-CM | POA: Diagnosis not present

## 2023-01-23 DIAGNOSIS — I9589 Other hypotension: Secondary | ICD-10-CM | POA: Diagnosis not present

## 2023-01-23 DIAGNOSIS — I5033 Acute on chronic diastolic (congestive) heart failure: Secondary | ICD-10-CM | POA: Diagnosis not present

## 2023-01-23 DIAGNOSIS — J9621 Acute and chronic respiratory failure with hypoxia: Secondary | ICD-10-CM | POA: Diagnosis not present

## 2023-01-23 DIAGNOSIS — J439 Emphysema, unspecified: Secondary | ICD-10-CM | POA: Diagnosis not present

## 2023-01-23 DIAGNOSIS — R0989 Other specified symptoms and signs involving the circulatory and respiratory systems: Secondary | ICD-10-CM | POA: Diagnosis not present

## 2023-01-23 DIAGNOSIS — I517 Cardiomegaly: Secondary | ICD-10-CM | POA: Diagnosis not present

## 2023-01-23 LAB — BASIC METABOLIC PANEL
Anion gap: 10 (ref 5–15)
BUN: 35 mg/dL — ABNORMAL HIGH (ref 8–23)
CO2: 31 mmol/L (ref 22–32)
Calcium: 8.1 mg/dL — ABNORMAL LOW (ref 8.9–10.3)
Chloride: 96 mmol/L — ABNORMAL LOW (ref 98–111)
Creatinine, Ser: 1.43 mg/dL — ABNORMAL HIGH (ref 0.61–1.24)
GFR, Estimated: 54 mL/min — ABNORMAL LOW (ref >60)
Glucose, Bld: 113 mg/dL — ABNORMAL HIGH (ref 70–99)
Potassium: 4.4 mmol/L (ref 3.5–5.1)
Sodium: 137 mmol/L (ref 135–145)

## 2023-01-23 MED ORDER — TORSEMIDE 20 MG PO TABS: 40.0000 mg | ORAL_TABLET | Freq: Two times a day (BID) | ORAL | Status: DC

## 2023-01-23 MED ORDER — TORSEMIDE 20 MG PO TABS
100.0000 mg | ORAL_TABLET | Freq: Once | ORAL | Status: AC
  Administered 2023-01-23: 100 mg via ORAL
  Filled 2023-01-23: qty 5

## 2023-01-23 MED ORDER — TORSEMIDE 20 MG PO TABS
40.0000 mg | ORAL_TABLET | Freq: Two times a day (BID) | ORAL | Status: DC
  Administered 2023-01-24 – 2023-02-12 (×39): 40 mg via ORAL
  Filled 2023-01-23 (×39): qty 2

## 2023-01-23 NOTE — Progress Notes (Signed)
Progress Note   Gary [name]: Gary [name] CZY:[id number] DOB: 11-Jul-1955 DOA: 11/28/2022     54 DOS: the Gary [name] was seen and examined on 01/23/2023   Brief hospital course: Gary [name] is a 67 y.o. 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   Gary [name] stated that Gary [name] ran out of his home torsemide several days ago due to a mixup with his pharmacy.  Then over the last couple days, Gary [name] has noticed increasing lower extremity swelling, shortness of breath and chest pain.  Gary [name] states that Gary [name] usually develops chest pain when Gary [name] has "fluid on his lungs."    Gary [name] was initially diuresed with IV diuresis, responded well.  Significant improvement in peripheral edema.  IV Lasix was switched with home torsemide once creatinine started increasing.  Gary [name] remained stable from respiratory standpoint point and remained on 6 to 8 L of supplemental oxygen via trach collar which is his baseline.  Gary [name] developed Pseudomonas tracheitis, which was treated with Levaquin as Gary [name] developed some pain around his tracheostomy.  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  Placement pending. Gary [name] needs a lot of assistance to get out of the bed. Appealing LTACH denial   10/07: ophthalmology saw Gary [name] today and Dr. 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  Gary [name] more short of breath today.  States the Lasix has not helped Gary [name].  Will give 100 mg of torsemide now and switch back to 40 mg torsemide twice daily.  Obtain chest x-ray       Assessment and Plan: * Acute on chronic diastolic CHF (congestive heart failure) ([name]) Discontinue IV Lasix.  Give torsemide 100 mg x 1 now and 40 mg twice daily starting tomorrow.  Obtain chest  x-ray.  Continue spironolactone.  Acute on chronic respiratory failure with hypoxia and hypercapnia ([name]) Continue trach collar 10 L.  Continue ventilator at night.  Gary [name] will need prolonged weaning secondary to severe COPD with hypercapnic hypoxic respiratory failure and sleep apnea.  Hopefully Gary [name] can wean back down to 6 to 8 L on the trach collar.  Gary [name] did not desaturate today with physical therapy down in the 80s.  COPD (chronic obstructive pulmonary disease) ([name]) Severe COPD on Combivent, as needed albuterol.  On yuperli nebulizer.  Acute kidney injury superimposed on CKD ([name]) Acute kidney injury on CKD stage IIIa.  Creatinine 1.43 with diuresis.  (On 9/18 did have a creatinine of 1.17).   Hypotension Off midodrine  OSA (obstructive sleep apnea) Continue home ventilator at night  Chest pain ACS ruled out.  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  Chronic colitis Continue home mesalamine  Generalized weakness Continue working with physical therapy and mobility specialist to work on getting out of the bed and ambulating.  Morbid obesity with BMI of 50.0-59.9, adult ([name]) BMI 51.83 with current height and weight in computer.        Subjective: Gary [name] seen this morning and again this afternoon.  Had worsening shortness of breath and did worse with physical therapy today.  States Gary [name] is full of fluid and feels like the fluid is in his lungs.  Gary [name] states the Lasix has not helped Gary [name] and wanted to go back to torsemide.  Physical Exam: Vitals:  01/23/23 0808 01/23/23 1304 01/23/23 1414 01/23/23 1504  BP: 116/71  114/65 107/68  Pulse: 82  80 83  Resp: 16   16  Temp: 98.2 F (36.8 C)   98.4 F (36.9 C)  TempSrc:      SpO2: 90%  92% 91%  Weight:  (!) 193.1 kg    Height:       Physical Exam HENT:     Head: Normocephalic.     Mouth/Throat:     Pharynx: No oropharyngeal exudate.  Eyes:     General: Lids are normal.     Conjunctiva/sclera: Conjunctivae  normal.  Cardiovascular:     Rate and Rhythm: Normal rate and regular rhythm.     Heart sounds: Normal heart sounds, S1 normal and S2 normal.  Pulmonary:     Breath sounds: Examination of the right-lower field reveals decreased breath sounds and rales. Examination of the left-lower field reveals decreased breath sounds and rales. Decreased breath sounds and rales present. No wheezing or rhonchi.  Abdominal:     Palpations: Abdomen is soft.     Tenderness: There is no abdominal tenderness.  Musculoskeletal:     Right lower leg: Swelling present.     Left lower leg: Swelling present.  Skin:    General: Skin is warm.     Findings: No rash.     Comments: Fungus toenails.  Neurological:     Mental Status: Gary [name] is alert and oriented to person, place, and time.     Data Reviewed: Creatinine 1.43, potassium 4.4   Disposition: Status is: Inpatient Remains inpatient appropriate because: Will give torsemide 100 mg this afternoon and switch back to 40 mg orally twice daily.  Planned Discharge Destination: Home    Time spent: 28 minutes  Author: Alford Highland, MD 01/23/2023 3:33 PM  For on call review [email or web address].

## 2023-01-23 NOTE — Assessment & Plan Note (Signed)
Lanced by ophthalmology.
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
## 2023-01-23 NOTE — Progress Notes (Signed)
Physical Therapy Treatment Patient Details Name: Gary [name] MRN: [id number] DOB: 1955/09/13 Today's Date: 01/23/2023   History of Present Illness 67 y/o male presented to [facility] ED on 11/28/22 for chest pain x 4 days. Admitted for acute on chronic CHF. Frequent admissions this year with most recent dsicharge on 11/21/22. PMH includes obesity, hypoxia, respiratory failure with tracheostomy, COPD, GIB, HFpEF.    PT Comments  Pt was long sitting in bed upon arrival. He remains more lethargic versus last few sessions observed by author. Pt is A and O x 4 but requires more time to perform task due to SOB/ weakness. Pt was on 10 L o2 throughout session. He did desaturate to 84% but recovers with prolonged seated rest. " I'm having a hard time due to all this fluid I'm retaining." Pt was abl to stand and take a few steps onto scale prior to a few steps back to EOB. Pt was overall more fatigued from less activity today. Acute PT will continue to follow and progress as able per current POC.   If plan is discharge home, recommend the following: A lot of help with bathing/dressing/bathroom;Assist for transportation;A lot of help with walking and/or transfers;A little help with walking and/or transfers   Can travel by private vehicle     No  Equipment Recommendations   (toilet seat riser for [facility]?)       Precautions / Restrictions Precautions Precautions: Fall Precaution Comments: trach collar/ Trach Restrictions Weight Bearing Restrictions: No RLE Weight Bearing: Weight bearing as tolerated     Mobility  Bed Mobility Overal bed mobility: Needs Assistance Bed Mobility: Supine to Sit, Sit to Supine (HOB elevated)  Supine to sit: Min assist, Mod assist, Used rails, HOB elevated Sit to supine: Mod assist   Transfers Overall transfer level: Needs assistance Equipment used: Rolling walker (2 wheels) Transfers: Sit to/from Stand Sit to Stand: Contact guard assist, From elevated surface  General  transfer comment: Pt has to stand from even higher than normal surface height today due to pt stating he is weak and needs increased assistance versus previous sessions.    Ambulation/Gait Ambulation/Gait assistance: Contact guard assist Gait Distance (Feet): 5 Feet Assistive device: Rolling walker (2 wheels) Gait Pattern/deviations: WFL(Within Functional Limits) Gait velocity: decreased  General Gait Details: pt was able to ambulate on 10 L trach co0llar to get ointo scales (425.8lb) then back to bed. Increased SOB noted today versus last time observed by author.    Balance Overall balance assessment: Needs assistance Sitting-balance support: Feet supported, No upper extremity supported Sitting balance-[name] Scale: Good Sitting balance - Comments: sitting EOB   Standing balance support: Bilateral upper extremity supported Standing balance-[name] Scale: Fair Standing balance comment: Reliant on RW/AD for all standing activity.       Cognition Arousal: Lethargic, Suspect due to medications Behavior During Therapy: Flat affect, WFL for tasks assessed/performed Overall Cognitive Status: Within Functional Limits for tasks assessed      General Comments: pt is overall more lethargic today versus last few sessions observed. pt is A and O x 4 but c/o increased fluid affecting his breathing and abilities. pt wa son 10L with occasional destauration to 84% that recovers with prolonged recovery time               Pertinent Vitals/Pain Pain Assessment Pain Assessment: 0-10 Pain Score: 6  Pain Location: BUE/BLE due to fluid Pain Descriptors / Indicators: Sore Pain Intervention(s): Limited activity within patient's tolerance, Monitored during session, Premedicated before  session, Repositioned     PT Goals (current goals can now be found in the care plan section) Acute Rehab PT Goals Patient Stated Goal: Get better so I can go home Progress towards PT goals: Not progressing toward  goals - comment    Frequency    Min 1X/week      PT Plan Discharge plan needs to be updated    Co-evaluation     PT goals addressed during session: Mobility/safety with mobility;Strengthening/ROM;Proper use of DME;Balance        AM-PAC PT "6 Clicks" Mobility   Outcome Measure  Help needed turning from your back to your side while in a flat bed without using bedrails?: None Help needed moving from lying on your back to sitting on the side of a flat bed without using bedrails?: A Little Help needed moving to and from a bed to a chair (including a wheelchair)?: A Little Help needed standing up from a chair using your arms (e.g., wheelchair or bedside chair)?: A Little Help needed to walk in hospital room?: A Lot Help needed climbing 3-5 steps with a railing? : Total 6 Click Score: 16    End of Session Equipment Utilized During Treatment: Oxygen (10 L trach collar) Activity Tolerance: Patient tolerated treatment well;Patient limited by fatigue Patient left: in bed;with call bell/phone within reach;with bed alarm set;with nursing/sitter in room Nurse Communication: Mobility status PT Visit Diagnosis: Muscle weakness (generalized) (M62.81);Difficulty in walking, not elsewhere classified (R26.2)     Time: 1610-9604 PT Time Calculation (min) (ACUTE ONLY): 30 min  Charges:    $Therapeutic Activity: 23-37 mins PT General Charges $$ ACUTE PT VISIT: 1 Visit                     Jetta Lout PTA 01/23/23, 2:10 PM

## 2023-01-24 DIAGNOSIS — N179 Acute kidney failure, unspecified: Secondary | ICD-10-CM | POA: Diagnosis not present

## 2023-01-24 DIAGNOSIS — J9621 Acute and chronic respiratory failure with hypoxia: Secondary | ICD-10-CM | POA: Diagnosis not present

## 2023-01-24 DIAGNOSIS — G4733 Obstructive sleep apnea (adult) (pediatric): Secondary | ICD-10-CM | POA: Diagnosis not present

## 2023-01-24 DIAGNOSIS — I5033 Acute on chronic diastolic (congestive) heart failure: Secondary | ICD-10-CM | POA: Diagnosis not present

## 2023-01-24 DIAGNOSIS — F419 Anxiety disorder, unspecified: Secondary | ICD-10-CM | POA: Diagnosis not present

## 2023-01-24 DIAGNOSIS — R079 Chest pain, unspecified: Secondary | ICD-10-CM | POA: Diagnosis not present

## 2023-01-24 DIAGNOSIS — J439 Emphysema, unspecified: Secondary | ICD-10-CM | POA: Diagnosis not present

## 2023-01-24 DIAGNOSIS — I959 Hypotension, unspecified: Secondary | ICD-10-CM | POA: Diagnosis not present

## 2023-01-24 DIAGNOSIS — H02823 Cysts of right eye, unspecified eyelid: Secondary | ICD-10-CM | POA: Diagnosis not present

## 2023-01-24 LAB — BASIC METABOLIC PANEL
Anion gap: 9 (ref 5–15)
BUN: 38 mg/dL — ABNORMAL HIGH (ref 8–23)
CO2: 33 mmol/L — ABNORMAL HIGH (ref 22–32)
Calcium: 8.1 mg/dL — ABNORMAL LOW (ref 8.9–10.3)
Chloride: 94 mmol/L — ABNORMAL LOW (ref 98–111)
Creatinine, Ser: 1.52 mg/dL — ABNORMAL HIGH (ref 0.61–1.24)
GFR, Estimated: 51 mL/min — ABNORMAL LOW (ref >60)
Glucose, Bld: 108 mg/dL — ABNORMAL HIGH (ref 70–99)
Potassium: 4.2 mmol/L (ref 3.5–5.1)
Sodium: 136 mmol/L (ref 135–145)

## 2023-01-24 LAB — CBC
HCT: 39.2 % (ref 39.0–52.0)
Hemoglobin: 10.2 g/dL — ABNORMAL LOW (ref 13.0–17.0)
MCH: 20.9 pg — ABNORMAL LOW (ref 26.0–34.0)
MCHC: 26 g/dL — ABNORMAL LOW (ref 30.0–36.0)
MCV: 80.2 fL (ref 80.0–100.0)
Platelets: 205 10*3/uL (ref 150–400)
RBC: 4.89 MIL/uL (ref 4.22–5.81)
RDW: 19.8 % — ABNORMAL HIGH (ref 11.5–15.5)
WBC: 7.5 10*3/uL (ref 4.0–10.5)
nRBC: 1.3 % — ABNORMAL HIGH (ref 0.0–0.2)

## 2023-01-24 LAB — PROCALCITONIN: Procalcitonin: 0.26 ng/mL

## 2023-01-24 MED ORDER — SPIRONOLACTONE 25 MG PO TABS
50.0000 mg | ORAL_TABLET | Freq: Every day | ORAL | Status: DC
  Administered 2023-01-25 – 2023-02-12 (×19): 50 mg via ORAL
  Filled 2023-01-24 (×19): qty 2

## 2023-01-24 NOTE — Plan of Care (Signed)

## 2023-01-24 NOTE — Progress Notes (Signed)
Progress Note   Patient: Gary [name] RCV:[id number] DOB: [birth year] DOA: 11/28/2022     55 DOS: the patient was seen and examined on 01/24/2023   Brief hospital course: Gary [name] is a 67 y.o. 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   Gary [name] stated that he ran out of his home torsemide several days ago due to a mixup with his pharmacy.  Then over the last couple days, he has noticed increasing lower extremity swelling, shortness of breath and chest pain.  He states that he usually develops chest pain when he has "fluid on his lungs."    Patient was initially diuresed with IV diuresis, responded well.  Significant improvement in peripheral edema.  IV Lasix was switched with home torsemide once creatinine started increasing.  Patient remained stable from respiratory standpoint point and remained on 6 to 8 L of supplemental oxygen via trach collar which is his baseline.  Patient developed Pseudomonas tracheitis, which was treated with Levaquin as he developed some pain around his tracheostomy.  Patient has end-stage COPD and need to continue follow-up with pulmonary as outpatient  9/25-10/7.  Placement pending. Patient needs a lot of assistance to get out of the bed. Appealing LTACH denial   10/07: ophthalmology saw Gary [name] today and Dr. 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  Patient more short of breath today.  States the Lasix has not helped Gary [name].  Will give 100 mg of torsemide now and switch back to 40 mg torsemide twice daily.  Chest x-ray consistent with cardiomegaly and pulmonary vascular congestion with bibasilar opacities which may represent atelectasis, aspiration and/or pneumonia. 10/10.  Since patient's white blood cell count was normal and  procalcitonin was negative, will hold off on antibiotics since lungs improved with torsemide dosing yesterday.       Assessment and Plan: * Acute on chronic diastolic CHF (congestive heart failure) ([name]) Improved from yesterday.  Continue torsemide 40 mg twice daily.  Continue spironolactone.  Acute on chronic respiratory failure with hypoxia and hypercapnia ([name]) Continue trach collar 10 L.  Continue ventilator at night.  Patient will need prolonged weaning secondary to severe COPD with hypercapnic hypoxic respiratory failure and sleep apnea.  Hopefully he can wean back down to 6 to 8 L on the trach collar.   COPD (chronic obstructive pulmonary disease) ([name]) Severe COPD on Combivent, as needed albuterol.  On yuperli nebulizer.  Acute kidney injury superimposed on CKD ([name]) Acute kidney injury on CKD stage IIIa.  Creatinine 1.52 with diuresis.  (On 9/18 did have a creatinine of 1.17).   Hypotension Off midodrine  Anxiety On Atarax as needed  OSA (obstructive sleep apnea) Continue home ventilator at night  Chest pain ACS ruled out.  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  Chronic colitis Continue home mesalamine  Generalized weakness Continue working with physical therapy and mobility specialist to work on getting out of the bed and ambulating.  Morbid obesity with BMI of 50.0-59.9, adult ([name]) BMI 51.83 with current height and weight in computer.        Subjective: Patient feels better with regards to his breathing today.  States he does have some cough which is whitish and bubbly.  Physical Exam: Vitals:   01/23/23 1930 01/23/23 2120 01/24/23 0023 01/24/23 0915  BP:   110/71 (!) 94/55  Pulse:   84 81  Resp:    19  Temp:   98.2 F (36.8 C) 98.2 F (36.8 C)  TempSrc:    Oral  SpO2: 90% 94% 97% 91%  Weight:      Height:       Physical Exam HENT:     Head: Normocephalic.     Mouth/Throat:     Pharynx: No oropharyngeal exudate.  Eyes:      General: Lids are normal.     Conjunctiva/sclera: Conjunctivae normal.  Cardiovascular:     Rate and Rhythm: Normal rate and regular rhythm.     Heart sounds: Normal heart sounds, S1 normal and S2 normal.  Pulmonary:     Breath sounds: Examination of the right-lower field reveals decreased breath sounds and rhonchi. Examination of the left-lower field reveals decreased breath sounds. Decreased breath sounds and rhonchi present. No wheezing or rales.  Abdominal:     Palpations: Abdomen is soft.     Tenderness: There is no abdominal tenderness.  Musculoskeletal:     Right lower leg: Swelling present.     Left lower leg: Swelling present.  Skin:    General: Skin is warm.     Findings: No rash.     Comments: Fungus toenails.  Neurological:     Mental Status: He is alert and oriented to person, place, and time.     Data Reviewed: Chest x-ray consistent with cardiomegaly and pulmonary vascular congestion, bibasilar consolidation could be atelectasis versus pneumonia. White blood cell count is normal.  Procalcitonin negative at 0.26, hemoglobin 10.2, platelet count 205, creatinine 1.52    Disposition: Status is: Inpatient Remains inpatient appropriate because: We do not have a safe disposition plan at this time.  Not a candidate for LTAC.  No rehab facility will take Gary [name].  Planned Discharge Destination: Home with Home Health    Time spent: 28 minutes Case discussed with Dr. Karna Christmas  Author: Alford Highland, MD 01/24/2023 2:22 PM  For on call review [email or web address].

## 2023-01-24 NOTE — Progress Notes (Signed)
Occupational Therapy Treatment Patient Details Name: Gary [name] MRN: [id number] DOB: [birth year] Today's Date: 01/24/2023   History of present illness 67 y/o male presented to [facility] ED on 11/28/22 for chest pain x 4 days. Admitted for acute on chronic CHF. Frequent admissions this year with most recent dsicharge on 11/21/22. PMH includes obesity, hypoxia, respiratory failure with tracheostomy, COPD, GIB, HFpEF.   OT comments  Gary [name] was seen for OT treatment on this date. Upon arrival to room pt reclined in bed, agreeable to tx. Pt requires MIN A exit bed, reports BLE swelling. Requires bed height significantly more elevated than his standard. CGA + RW for ~60 ft mobility. Left in chair. Pt making progress toward goals, will continue to follow POC. Discharge recommendation remains appropriate.      If plan is discharge home, recommend the following:  A little help with walking and/or transfers;A lot of help with bathing/dressing/bathroom   Equipment Recommendations  None recommended by OT    Recommendations for Other Services      Precautions / Restrictions Precautions Precautions: Fall Precaution Comments: trach collar/ Trach Restrictions Weight Bearing Restrictions: No       Mobility Bed Mobility Overal bed mobility: Needs Assistance Bed Mobility: Supine to Sit Rolling: Min assist         General bed mobility comments: assist for BLE citing swelling    Transfers Overall transfer level: Needs assistance Equipment used: Rolling walker (2 wheels) Transfers: Sit to/from Stand Sit to Stand: Contact guard assist, From elevated surface           General transfer comment: stands from max elevated height >35"     Balance Overall balance assessment: Needs assistance Sitting-balance support: Feet supported, No upper extremity supported Sitting balance-[name] Scale: Good     Standing balance support: Bilateral upper extremity supported Standing balance-[name] Scale:  Fair                             ADL either performed or assessed with clinical judgement   ADL Overall ADL's : Needs assistance/impaired                                       General ADL Comments: MAX A don B shoes in sitting      Cognition Arousal: Alert Behavior During Therapy: WFL for tasks assessed/performed Overall Cognitive Status: Within Functional Limits for tasks assessed                                                General Comments SpO2 91% on 8L trach collar    Pertinent Vitals/ Pain       Pain Assessment Pain Assessment: No/denies pain   Frequency  Min 1X/week        Progress Toward Goals  OT Goals(current goals can now be found in the care plan section)  Progress towards OT goals: Progressing toward goals  Acute Rehab OT Goals Patient Stated Goal: to walk OT Goal Formulation: With patient Time For Goal Achievement: 01/28/23 Potential to Achieve Goals: Good ADL Goals Pt Will Perform Grooming: with modified independence;standing Pt Will Transfer to Toilet: with modified independence;ambulating;regular height toilet Pt Will Perform Toileting - Clothing Manipulation and hygiene: with mod  assist;sit to/from stand Additional ADL Goal #1: Pt will stand from the hospital bed or recliner adn LR DME with min A as a precursor to ADLs  Plan Discharge plan remains appropriate;Frequency remains appropriate    Co-evaluation                 AM-PAC OT "6 Clicks" Daily Activity     Outcome Measure   Help from another person eating meals?: None Help from another person taking care of personal grooming?: A Little Help from another person toileting, which includes using toliet, bedpan, or urinal?: A Lot Help from another person bathing (including washing, rinsing, drying)?: A Lot Help from another person to put on and taking off regular upper body clothing?: None Help from another person to put on and  taking off regular lower body clothing?: A Lot 6 Click Score: 17    End of Session Equipment Utilized During Treatment: Oxygen;Rolling walker (2 wheels)  OT Visit Diagnosis: Unsteadiness on feet (R26.81);Other abnormalities of gait and mobility (R26.89);Muscle weakness (generalized) (M62.81);Adult, failure to thrive (R62.7)   Activity Tolerance Patient tolerated treatment well   Patient Left in chair;with call bell/phone within reach   Nurse Communication Mobility status        Time: 1610-9604 OT Time Calculation (min): 27 min  Charges: OT General Charges $OT Visit: 1 Visit OT Treatments $Self Care/Home Management : 8-22 mins $Therapeutic Activity: 8-22 mins  Kathie Dike, M.S. OTR/L  01/24/23, 1:53 PM  ascom [phone]

## 2023-01-24 NOTE — Assessment & Plan Note (Addendum)
On Atarax as needed.  Continue Lexapro

## 2023-01-24 NOTE — Plan of Care (Signed)
  Problem: Education: Goal: Knowledge of General Education information will improve Description: Including pain rating scale, medication(s)/side effects and non-pharmacologic comfort measures Outcome: Progressing   Problem: Health Behavior/Discharge Planning: Goal: Ability to manage health-related needs will improve Outcome: Progressing   Problem: Activity: Goal: Risk for activity intolerance will decrease Outcome: Progressing   
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
## 2023-01-25 DIAGNOSIS — L853 Xerosis cutis: Secondary | ICD-10-CM

## 2023-01-25 DIAGNOSIS — N179 Acute kidney failure, unspecified: Secondary | ICD-10-CM | POA: Diagnosis not present

## 2023-01-25 DIAGNOSIS — I5033 Acute on chronic diastolic (congestive) heart failure: Secondary | ICD-10-CM | POA: Diagnosis not present

## 2023-01-25 DIAGNOSIS — J9621 Acute and chronic respiratory failure with hypoxia: Secondary | ICD-10-CM | POA: Diagnosis not present

## 2023-01-25 DIAGNOSIS — J439 Emphysema, unspecified: Secondary | ICD-10-CM | POA: Diagnosis not present

## 2023-01-25 MED ORDER — ESCITALOPRAM OXALATE 10 MG PO TABS
10.0000 mg | ORAL_TABLET | Freq: Every day | ORAL | Status: DC
  Administered 2023-01-25 – 2023-04-23 (×87): 10 mg via ORAL
  Filled 2023-01-25 (×91): qty 1

## 2023-01-25 MED ORDER — AMMONIUM LACTATE 12 % EX LOTN
1.0000 | TOPICAL_LOTION | Freq: Two times a day (BID) | CUTANEOUS | Status: DC | PRN
  Administered 2023-01-25 – 2023-01-26 (×2): 1 via TOPICAL
  Filled 2023-01-25: qty 400

## 2023-01-25 NOTE — Assessment & Plan Note (Addendum)
Lac-Hydrin and antifungal cream to feet.

## 2023-01-25 NOTE — Plan of Care (Signed)
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
## 2023-01-25 NOTE — Progress Notes (Signed)
Progress Note   Patient: Gary [name] UXL:[id number] DOB: 11/28/1955 DOA: 11/28/2022     56 DOS: the patient was seen and examined on 01/25/2023   Brief hospital course: [name] is a 67 y.o. 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   Mr. Hariri stated that he ran out of his home torsemide several days ago due to a mixup with his pharmacy.  Then over the last couple days, he has noticed increasing lower extremity swelling, shortness of breath and chest pain.  He states that he usually develops chest pain when he has "fluid on his lungs."    Patient was initially diuresed with IV diuresis, responded well.  Significant improvement in peripheral edema.  IV Lasix was switched with home torsemide once creatinine started increasing.  Patient remained stable from respiratory standpoint point and remained on 6 to 8 L of supplemental oxygen via trach collar which is his baseline.  Patient developed Pseudomonas tracheitis, which was treated with Levaquin as he developed some pain around his tracheostomy.  Patient has end-stage COPD and need to continue follow-up with pulmonary as outpatient  9/25-10/7.  Placement pending. Patient needs a lot of assistance to get out of the bed. Appealing LTACH denial   10/07: ophthalmology saw him today and Dr. 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  Patient more short of breath today.  States the Lasix has not helped him.  Will give 100 mg of torsemide now and switch back to 40 mg torsemide twice daily.  Chest x-ray consistent with cardiomegaly and pulmonary vascular congestion with bibasilar opacities which may represent atelectasis, aspiration and/or pneumonia. 10/10.  Since patient's white blood cell count was normal and  procalcitonin was negative, will hold off on antibiotics since lungs improved with torsemide dosing yesterday. 9/11.  Patient feels that his swelling is a little bit better breathing is a little bit better.  Will start Lexapro for anxiety.  Lac-Hydrin lotion for dryness on his feet.       Assessment and Plan: * Acute on chronic diastolic CHF (congestive heart failure) ([name]) Continue torsemide 40 mg twice daily.  Continue spironolactone.  Acute on chronic respiratory failure with hypoxia and hypercapnia ([name]) Continue trach collar 7 L.  Continue ventilator at night.  Patient will need prolonged weaning secondary to severe COPD with hypercapnic hypoxic respiratory failure and sleep apnea.  COPD (chronic obstructive pulmonary disease) ([name]) Severe COPD on Combivent, as needed albuterol.  On yuperli nebulizer.  Acute kidney injury superimposed on CKD ([name]) Acute kidney injury on CKD stage IIIa.  Creatinine 1.52 with diuresis.  (On 9/18 did have a creatinine of 1.17).   Hypotension Off midodrine  Anxiety On Atarax as needed.  Will add low-dose Lexapro  OSA (obstructive sleep apnea) Continue home ventilator at night  Chest pain ACS ruled out.  Dry skin Add Lac-Hydrin lotion to feet.  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  Chronic colitis Continue home mesalamine  Generalized weakness Continue working with physical therapy and mobility specialist to work on getting out of the bed and ambulating.  Morbid obesity with BMI of 50.0-59.9, adult ([name]) BMI 51.83 with current height and weight in computer.        Subjective: Patient feels a little bit better with regards to his swelling  and his breathing.  Interested in starting medication for anxiety.  Physical Exam: Vitals:   01/24/23 2039 01/24/23 2311 01/25/23 0731 01/25/23 0733  BP:  107/68 110/70 101/69  Pulse:  84 81 83  Resp:  16 17   Temp:  98 F (36.7 C) 98.2 F (36.8 C)   TempSrc:   Oral   SpO2: 92%  92% 93% 92%  Weight:      Height:       Physical Exam HENT:     Head: Normocephalic.     Mouth/Throat:     Pharynx: No oropharyngeal exudate.  Eyes:     General: Lids are normal.     Conjunctiva/sclera: Conjunctivae normal.  Cardiovascular:     Rate and Rhythm: Normal rate and regular rhythm.     Heart sounds: Normal heart sounds, S1 normal and S2 normal.  Pulmonary:     Breath sounds: Examination of the right-lower field reveals decreased breath sounds. Examination of the left-lower field reveals decreased breath sounds. Decreased breath sounds present. No wheezing, rhonchi or rales.  Abdominal:     Palpations: Abdomen is soft.     Tenderness: There is no abdominal tenderness.  Musculoskeletal:     Right lower leg: Swelling present.     Left lower leg: Swelling present.  Skin:    General: Skin is warm.     Comments: Fungus toenails.  Scaling bilateral feet.  Neurological:     Mental Status: He is alert and oriented to person, place, and time.     Data Reviewed: Last creatinine 1.52   Disposition: Status is: Inpatient Remains inpatient appropriate because: Unsafe disposition currently.  Not accepted to LTAC and no facility will take him.  Needs to be stronger in order to go home and would need home health set up.  Planned Discharge Destination: Likely will end up going home at some point    Time spent: 27 minutes  Author: Alford Highland, MD 01/25/2023 1:19 PM  For on call review [email or web address].

## 2023-01-25 NOTE — Progress Notes (Signed)
PT Cancellation Note  Patient Details Name: GEE HABIG MRN: [id number] DOB: 11/06/1955   Cancelled Treatment:     PT attempt. " I feel so sick right now." Pt unwilling to participate at this time requesting PT to return later.    [name] 01/25/2023, 1:39 PM

## 2023-01-25 NOTE — TOC Progression Note (Signed)
Transition of Care [facility]) - Progression Note    Patient Details  Name: Gary [name] MRN: [id number] Date of Birth: [birth year]  Transition [facility] CM/SW Contact  Marlowe Sax, RN Phone Number: 01/25/2023, 1:18 PM  Clinical Narrative:     TOC continues to follow the patient  He is digressing and not doing as well, he becomes SOB easier and not able to ambulate to the extent as previous, he remains on 10 liters O2, Hopefully he can wean back down to 6 to 8 L on the trach collar.     Expected Discharge Plan: Home w Home Health Services Barriers to Discharge: Continued Medical Work up  Expected Discharge Plan and Services     Post Acute Care Choice: Home Health                   DME Arranged: Bedside commode DME Agency: [employer] Date DME Agency Contacted: 01/12/23 Time DME Agency Contacted: 1427 Representative spoke with at DME Agency: [facility] HH Arranged: PT, OT HH Agency: [facility] Date [facility] Agency Contacted: 01/12/23 Time HH Agency Contacted: 1428 Representative spoke with at [facility] Agency: Kandee Keen   Social Determinants of Health (SDOH) Interventions SDOH Screenings   Food Insecurity: No Food Insecurity (12/01/2022)  Housing: Patient Declined (12/01/2022)  Transportation Needs: No Transportation Needs (12/01/2022)  Utilities: Not At Risk (12/01/2022)  Depression (PHQ2-9): Low Risk  (06/07/2021)  Financial Resource Strain: High Risk (11/21/2022)   Received from [facility]  Physical Activity: Insufficiently Active (05/03/2017)  Social Connections: Patient Declined (11/20/2022)   Received from [facility]  Stress: Stress Concern Present (11/20/2022)   Received from [facility]  Tobacco Use: Medium Risk (12/01/2022)    Readmission Risk Interventions    11/05/2022    2:59 PM 11/05/2022   12:16 PM 12/31/2021    8:58 AM  Readmission Risk Prevention Plan  Transportation Screening Complete Complete Complete  Medication Review Ocean[employer]) Complete  Complete Complete  PCP or Specialist appointment within 3-5 days of discharge Complete Complete Complete  HRI or Home Care Consult Complete Complete Complete  SW Recovery Care/Counseling Consult Complete Complete Complete  Palliative Care Screening Complete Complete Complete  Skilled Nursing Facility Not Applicable Not Applicable Not Applicable

## 2023-01-26 DIAGNOSIS — J9621 Acute and chronic respiratory failure with hypoxia: Secondary | ICD-10-CM | POA: Diagnosis not present

## 2023-01-26 DIAGNOSIS — I5033 Acute on chronic diastolic (congestive) heart failure: Secondary | ICD-10-CM | POA: Diagnosis not present

## 2023-01-26 DIAGNOSIS — N179 Acute kidney failure, unspecified: Secondary | ICD-10-CM | POA: Diagnosis not present

## 2023-01-26 DIAGNOSIS — J439 Emphysema, unspecified: Secondary | ICD-10-CM | POA: Diagnosis not present

## 2023-01-26 LAB — CBC
HCT: 39.1 % (ref 39.0–52.0)
Hemoglobin: 10.3 g/dL — ABNORMAL LOW (ref 13.0–17.0)
MCH: 20.7 pg — ABNORMAL LOW (ref 26.0–34.0)
MCHC: 26.3 g/dL — ABNORMAL LOW (ref 30.0–36.0)
MCV: 78.7 fL — ABNORMAL LOW (ref 80.0–100.0)
Platelets: 209 10*3/uL (ref 150–400)
RBC: 4.97 MIL/uL (ref 4.22–5.81)
RDW: 19.8 % — ABNORMAL HIGH (ref 11.5–15.5)
WBC: 8.2 10*3/uL (ref 4.0–10.5)
nRBC: 0.4 % — ABNORMAL HIGH (ref 0.0–0.2)

## 2023-01-26 LAB — BASIC METABOLIC PANEL
Anion gap: 9 (ref 5–15)
BUN: 38 mg/dL — ABNORMAL HIGH (ref 8–23)
CO2: 35 mmol/L — ABNORMAL HIGH (ref 22–32)
Calcium: 8.1 mg/dL — ABNORMAL LOW (ref 8.9–10.3)
Chloride: 94 mmol/L — ABNORMAL LOW (ref 98–111)
Creatinine, Ser: 1.52 mg/dL — ABNORMAL HIGH (ref 0.61–1.24)
GFR, Estimated: 51 mL/min — ABNORMAL LOW (ref >60)
Glucose, Bld: 123 mg/dL — ABNORMAL HIGH (ref 70–99)
Potassium: 3.8 mmol/L (ref 3.5–5.1)
Sodium: 138 mmol/L (ref 135–145)

## 2023-01-26 NOTE — Progress Notes (Signed)
Progress Note   Patient: Gary [name]:[id number] DOB: 06-10-1955 DOA: 11/28/2022     57 DOS: the patient was seen and examined on 01/26/2023   Brief hospital course: Gary [name] is a 67 y.o. 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   Gary [name] stated that he ran out of his home torsemide several days ago due to a mixup with his pharmacy.  Then over the last couple days, he has noticed increasing lower extremity swelling, shortness of breath and chest pain.  He states that he usually develops chest pain when he has "fluid on his lungs."    Patient was initially diuresed with IV diuresis, responded well.  Significant improvement in peripheral edema.  IV Lasix was switched with home torsemide once creatinine started increasing.  Patient remained stable from respiratory standpoint point and remained on 6 to 8 L of supplemental oxygen via trach collar which is his baseline.  Patient developed Pseudomonas tracheitis, which was treated with Levaquin as he developed some pain around his tracheostomy.  Patient has end-stage COPD and need to continue follow-up with pulmonary as outpatient  9/25-10/7.  Placement pending. Patient needs a lot of assistance to get out of the bed. Appealing LTACH denial   10/07: ophthalmology saw him today and Dr. 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  Patient more short of breath today.  States the Lasix has not helped him.  Will give 100 mg of torsemide now and switch back to 40 mg torsemide twice daily.  Chest x-ray consistent with cardiomegaly and pulmonary vascular congestion with bibasilar opacities which may represent atelectasis, aspiration and/or pneumonia. 10/10.  Since patient's white blood cell count was normal and  procalcitonin was negative, will hold off on antibiotics since lungs improved with torsemide dosing yesterday. 10/11.  Patient feels that his swelling is a little bit better breathing is a little bit better.  Will start Lexapro for anxiety.  Lac-Hydrin lotion for dryness on his feet.       Assessment and Plan: * Acute on chronic diastolic CHF (congestive heart failure) ([name]) Continue torsemide 40 mg twice daily.  Continue spironolactone.  Acute on chronic respiratory failure with hypoxia and hypercapnia ([name]) Continue trach collar 7 L.  Continue ventilator at night.  Patient will need prolonged weaning secondary to severe COPD with hypercapnic hypoxic respiratory failure and sleep apnea.  COPD (chronic obstructive pulmonary disease) ([name]) Severe COPD on Combivent, as needed albuterol.  On yuperli nebulizer.  Acute kidney injury superimposed on CKD ([name]) Acute kidney injury on CKD stage IIIa.  Creatinine 1.52 with diuresis.  (On 9/18 did have a creatinine of 1.17).  Will have to have a balance of where his creatinine is being on torsemide 40 mg twice a day.  Hypotension Off midodrine  Anxiety On Atarax as needed.  Continue Lexapro  OSA (obstructive sleep apnea) Continue home ventilator at night  Chest pain ACS ruled out.  Dry skin Add Lac-Hydrin lotion to feet.  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  Chronic colitis Continue home mesalamine  Generalized weakness Continue working with physical therapy and mobility specialist to work on getting out of the bed and ambulating.  Morbid obesity with BMI of 50.0-59.9, adult ([name]) BMI 51.83 with current height and weight in computer.  Subjective: Patient feeling a little bit better with regards to his breathing and his lower extremity swelling.  And initially 58 days ago with shortness of breath.  Physical Exam: Vitals:   01/25/23 2025 01/25/23 2245 01/26/23 0802 01/26/23 0919  BP:  111/68  111/63  Pulse:   85 80 83  Resp:  16  20  Temp:   98.6 F (37 C) 98.2 F (36.8 C)  TempSrc:   Oral Oral  SpO2: 92% 90% 100% 92%  Weight:      Height:       Physical Exam HENT:     Head: Normocephalic.     Mouth/Throat:     Pharynx: No oropharyngeal exudate.  Eyes:     General: Lids are normal.     Conjunctiva/sclera: Conjunctivae normal.  Cardiovascular:     Rate and Rhythm: Normal rate and regular rhythm.     Heart sounds: Normal heart sounds, S1 normal and S2 normal.  Pulmonary:     Breath sounds: Examination of the right-lower field reveals decreased breath sounds. Examination of the left-lower field reveals decreased breath sounds. Decreased breath sounds present. No wheezing, rhonchi or rales.  Abdominal:     Palpations: Abdomen is soft.     Tenderness: There is no abdominal tenderness.  Musculoskeletal:     Right lower leg: Swelling present.     Left lower leg: Swelling present.  Skin:    General: Skin is warm.     Comments: Fungus toenails.  Scaling bilateral feet.  Neurological:     Mental Status: He is alert and oriented to person, place, and time.     Data Reviewed: Creatinine 1.52  Disposition: Status is: Inpatient Remains inpatient appropriate because: We do not have a disposition plan.  Still trying to work with PT to get strong enough to go home with home health.  Planned Discharge Destination: Home with Home Health    Time spent: 26 minutes  Author: Alford Highland, MD 01/26/2023 1:10 PM  For on call review [email or web address].

## 2023-01-26 NOTE — Progress Notes (Signed)
Mobility Specialist - Progress Note   01/26/23 1345  Mobility  Activity Stood at bedside;Dangled on edge of bed;Ambulated with assistance in room  Level of Assistance Standby assist, set-up cues, supervision of patient - no hands on  Assistive Device Front wheel walker  Distance Ambulated (ft) 2 ft  Activity Response Tolerated well  Mobility Referral Yes  $Mobility charge 1 Mobility  Mobility Specialist Start Time (ACUTE ONLY) 1311  Mobility Specialist Stop Time (ACUTE ONLY) 1333  Mobility Specialist Time Calculation (min) (ACUTE ONLY) 22 min   Pt semi-supine in bed on Trach collar upon arrival. Pt completes bed mobility indep utilizing bed features.  Pt needs MAX A don B shoes EOB. Pt STS X2 and takes a few side steps SBA + 2 for safety. Pt returns to bed with needs in reach.   [name]  Mobility Specialist  01/26/23 1:48 PM
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
## 2023-01-26 NOTE — Plan of Care (Signed)
Problem: Education: Goal: Knowledge of General Education information will improve Description: Including pain rating scale, medication(s)/side effects and non-pharmacologic comfort measures Outcome: Progressing   Problem: Health Behavior/Discharge Planning: Goal: Ability to manage health-related needs will improve Outcome: Progressing   Problem: Clinical Measurements: Goal: Ability to maintain clinical measurements within normal limits will improve Outcome: Progressing Goal: Will remain free from infection Outcome: Progressing Goal: Diagnostic test results will improve Outcome: Progressing Goal: Cardiovascular complication will be avoided Outcome: Progressing   Problem: Activity: Goal: Risk for activity intolerance will decrease Outcome: Progressing   Problem: Nutrition: Goal: Adequate nutrition will be maintained Outcome: Progressing   Problem: Coping: Goal: Level of anxiety will decrease Outcome: Progressing

## 2023-01-26 NOTE — Plan of Care (Signed)

## 2023-01-27 DIAGNOSIS — J9621 Acute and chronic respiratory failure with hypoxia: Secondary | ICD-10-CM | POA: Diagnosis not present

## 2023-01-27 DIAGNOSIS — J439 Emphysema, unspecified: Secondary | ICD-10-CM | POA: Diagnosis not present

## 2023-01-27 DIAGNOSIS — I5033 Acute on chronic diastolic (congestive) heart failure: Secondary | ICD-10-CM | POA: Diagnosis not present

## 2023-01-27 DIAGNOSIS — I9589 Other hypotension: Secondary | ICD-10-CM | POA: Diagnosis not present

## 2023-01-27 NOTE — Plan of Care (Signed)

## 2023-01-27 NOTE — Progress Notes (Signed)
Progress Note   Patient: Gary [name] WUX:[id number] DOB: 03-20-1956 DOA: 11/28/2022     58 DOS: the patient was seen and examined on 01/27/2023   Brief hospital course: Gary [name] is a 67 y.o. 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   [name] stated that he ran out of his home torsemide several days ago due to a mixup with his pharmacy.  Then over the last couple days, he has noticed increasing lower extremity swelling, shortness of breath and chest pain.  He states that he usually develops chest pain when he has "fluid on his lungs."    Patient was initially diuresed with IV diuresis, responded well.  Significant improvement in peripheral edema.  IV Lasix was switched with home torsemide once creatinine started increasing.  Patient remained stable from respiratory standpoint point and remained on 6 to 8 L of supplemental oxygen via trach collar which is his baseline.  Patient developed Pseudomonas tracheitis, which was treated with Levaquin as he developed some pain around his tracheostomy.  Patient has end-stage COPD and need to continue follow-up with pulmonary as outpatient  9/25-10/7.  Placement pending. Patient needs a lot of assistance to get out of the bed. Appealing LTACH denial   10/07: ophthalmology saw him today and Dr. 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  Patient more short of breath today.  States the Lasix has not helped him.  Will give 100 mg of torsemide now and switch back to 40 mg torsemide twice daily.  Chest x-ray consistent with cardiomegaly and pulmonary vascular congestion with bibasilar opacities which may represent atelectasis, aspiration and/or pneumonia. 10/10.  Since patient's white blood cell count was normal and  procalcitonin was negative, will hold off on antibiotics since lungs improved with torsemide dosing yesterday. 10/11.  Patient feels that his swelling is a little bit better breathing is a little bit better.  Will start Lexapro for anxiety.  Lac-Hydrin lotion for dryness on his feet.       Assessment and Plan: * Acute on chronic diastolic CHF (congestive heart failure) ([name]) Continue torsemide 40 mg twice daily.  Continue spironolactone.  Acute on chronic respiratory failure with hypoxia and hypercapnia ([name]) Continue trach collar 7 L.  Continue ventilator at night.  Patient will need prolonged weaning secondary to severe COPD with hypercapnic hypoxic respiratory failure and sleep apnea.  COPD (chronic obstructive pulmonary disease) ([name]) Severe COPD on Combivent, as needed albuterol.  On yuperli nebulizer.  Acute kidney injury superimposed on CKD ([name]) Acute kidney injury on CKD stage IIIa.  Creatinine 1.52 with diuresis.  (On 9/18 did have a creatinine of 1.17).  Creatinine will likely stay higher being on torsemide 40 mg twice a day.  Hypotension Off midodrine  Anxiety On Atarax as needed.  Continue Lexapro  OSA (obstructive sleep apnea) Continue home ventilator at night  Chest pain ACS ruled out.  Dry skin Add Lac-Hydrin lotion to feet.  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  Chronic colitis Continue home mesalamine  Generalized weakness Continue working with physical therapy and mobility specialist to work on getting out of the bed and ambulating.  Patient doing arm band exercises in the bed this morning when I saw him.  Morbid obesity with BMI of 50.0-59.9, adult ([name]) BMI 51.83  with current height and weight in computer.        Subjective: Patient was lying flat because he was doing arm exercises with the band that was tied to the top of his bed.  States his breathing is better.  Patient on 7 L trach collar.  Admitted 59 days ago with shortness of  breath.  Physical Exam: Vitals:   01/26/23 1604 01/26/23 1743 01/26/23 2308 01/27/23 0745  BP: 104/68 136/68 121/64 96/67  Pulse: 87  84 80  Resp: 18  18 14   Temp: 98.8 F (37.1 C)  98.5 F (36.9 C) 98.3 F (36.8 C)  TempSrc: Oral  Oral   SpO2: 90%  93% 94%  Weight:      Height:       Physical Exam HENT:     Head: Normocephalic.     Mouth/Throat:     Pharynx: No oropharyngeal exudate.  Eyes:     General: Lids are normal.     Conjunctiva/sclera: Conjunctivae normal.  Cardiovascular:     Rate and Rhythm: Normal rate and regular rhythm.     Heart sounds: Normal heart sounds, S1 normal and S2 normal.  Pulmonary:     Breath sounds: Examination of the right-lower field reveals decreased breath sounds. Examination of the left-lower field reveals decreased breath sounds. Decreased breath sounds present. No wheezing, rhonchi or rales.  Abdominal:     Palpations: Abdomen is soft.     Tenderness: There is no abdominal tenderness.  Musculoskeletal:     Right lower leg: Swelling present.     Left lower leg: Swelling present.  Skin:    General: Skin is warm.     Comments: Fungus toenails.  Scaling bilateral feet.  Neurological:     Mental Status: He is alert and oriented to person, place, and time.     Data Reviewed: Last creatinine 1.52  Disposition: Status is: Inpatient Remains inpatient appropriate because: We do not have a safe disposition at this point  Planned Discharge Destination: Home with home health    Time spent: 27 minutes  Author: Alford Highland, MD 01/27/2023 12:56 PM  For on call review [email or web address].
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
## 2023-01-28 DIAGNOSIS — N179 Acute kidney failure, unspecified: Secondary | ICD-10-CM | POA: Diagnosis not present

## 2023-01-28 DIAGNOSIS — J9621 Acute and chronic respiratory failure with hypoxia: Secondary | ICD-10-CM | POA: Diagnosis not present

## 2023-01-28 DIAGNOSIS — I5033 Acute on chronic diastolic (congestive) heart failure: Secondary | ICD-10-CM | POA: Diagnosis not present

## 2023-01-28 DIAGNOSIS — J439 Emphysema, unspecified: Secondary | ICD-10-CM | POA: Diagnosis not present

## 2023-01-28 NOTE — Progress Notes (Signed)
Physical Therapy Treatment Patient Details Name: Gary [name] MRN: [id number] DOB: Jan 24, 1956 Today's Date: 01/28/2023   History of Present Illness 67 y/o male presented to [facility] ED on 11/28/22 for chest pain x 4 days. Admitted for acute on chronic CHF. Frequent admissions this year with most recent dsicharge on 11/21/22. PMH includes obesity, hypoxia, respiratory failure with tracheostomy, COPD, GIB, HFpEF.    PT Comments  Pt seen for PT/OT co-treatment during session.  Pt reclined in bed and agreeable to therapy.  Pt continues to require bed controls and heavy UE assistance to come upright on EOB.  Pt required 2 trials to come upright in sitting position as he felt he was sliding the first attempt.  Pt able to transfer and ambulate ~60' with chair follow.  Pt performing better mobility today compared to treatment sessions in recent history.  Pt left in chair with all needs within reach.      If plan is discharge home, recommend the following: A lot of help with bathing/dressing/bathroom;Assist for transportation;A lot of help with walking and/or transfers;A little help with walking and/or transfers   Can travel by private vehicle        Equipment Recommendations   (toilet seat riser for [facility]?)    Recommendations for Other Services       Precautions / Restrictions Precautions Precautions: Fall Precaution Comments: trach collar/ Trach Restrictions Weight Bearing Restrictions: No     Mobility  Bed Mobility Overal bed mobility: Needs Assistance Bed Mobility: Supine to Sit, Sit to Supine     Supine to sit: Supervision Sit to supine: Mod assist   General bed mobility comments: assist for BLE    Transfers Overall transfer level: Needs assistance Equipment used: Rolling walker (2 wheels) Transfers: Sit to/from Stand Sit to Stand: Contact guard assist, From elevated surface                Ambulation/Gait Ambulation/Gait assistance: Contact guard assist Gait Distance  (Feet): 60 Feet Assistive device: Rolling walker (2 wheels) Gait Pattern/deviations: WFL(Within Functional Limits) Gait velocity: decreased     General Gait Details: Pt able to ambulate ~60 feet with chair follow.   Stairs             Wheelchair Mobility     Tilt Bed    Modified Rankin (Stroke Patients Only)       Balance Overall balance assessment: Needs assistance Sitting-balance support: Feet supported, No upper extremity supported Sitting balance-[name] Scale: Good     Standing balance support: Bilateral upper extremity supported Standing balance-[name] Scale: Fair Standing balance comment: Reliant on RW/AD for all standing activity.                            Cognition Arousal: Alert Behavior During Therapy: WFL for tasks assessed/performed Overall Cognitive Status: Within Functional Limits for tasks assessed                                          Exercises      General Comments        Pertinent Vitals/Pain Pain Assessment Pain Assessment: No/denies pain    Home Living                          Prior Function  PT Goals (current goals can now be found in the care plan section) Acute Rehab PT Goals Patient Stated Goal: Get better so I can go home PT Goal Formulation: With patient Time For Goal Achievement: 02/11/23 Potential to Achieve Goals: Good Progress towards PT goals: Progressing toward goals    Frequency    Min 1X/week      PT Plan Discharge plan needs to be updated    Co-evaluation   Reason for Co-Treatment: To address functional/ADL transfers PT goals addressed during session: Mobility/safety with mobility;Strengthening/ROM;Proper use of DME;Balance OT goals addressed during session: ADL's and self-care      AM-PAC PT "6 Clicks" Mobility   Outcome Measure  Help needed turning from your back to your side while in a flat bed without using bedrails?: None Help needed  moving from lying on your back to sitting on the side of a flat bed without using bedrails?: A Little Help needed moving to and from a bed to a chair (including a wheelchair)?: A Little Help needed standing up from a chair using your arms (e.g., wheelchair or bedside chair)?: A Little Help needed to walk in hospital room?: A Lot Help needed climbing 3-5 steps with a railing? : Total 6 Click Score: 16    End of Session Equipment Utilized During Treatment: Oxygen Activity Tolerance: Patient tolerated treatment well;Patient limited by fatigue Patient left: with call bell/phone within reach;with nursing/sitter in room;in chair Nurse Communication: Mobility status PT Visit Diagnosis: Muscle weakness (generalized) (M62.81);Difficulty in walking, not elsewhere classified (R26.2)     Time: 5409-8119 PT Time Calculation (min) (ACUTE ONLY): 29 min  Charges:    $Therapeutic Activity: 8-22 mins PT General Charges $$ ACUTE PT VISIT: 1 Visit                     Nolon Bussing, PT, DPT Physical Therapist - [facility]  01/28/23, 3:04 PM

## 2023-01-28 NOTE — Plan of Care (Signed)
Problem: Education: Goal: Knowledge of General Education information will improve Description: Including pain rating scale, medication(s)/side effects and non-pharmacologic comfort measures Outcome: Progressing   Problem: Nutrition: Goal: Adequate nutrition will be maintained Outcome: Progressing   Problem: Coping: Goal: Level of anxiety will decrease Outcome: Progressing

## 2023-01-28 NOTE — Progress Notes (Signed)
Occupational Therapy Treatment Patient Details Name: Gary [name] MRN: [id number] DOB: [birth year] Today's Date: 01/28/2023   History of present illness 67 y/o male presented to [facility] ED on 11/28/22 for chest pain x 4 days. Admitted for acute on chronic CHF. Frequent admissions this year with most recent dsicharge on 11/21/22. PMH includes obesity, hypoxia, respiratory failure with tracheostomy, COPD, GIB, HFpEF.   OT comments  Mr Dipace was seen for OT/PT co-treatment on this date. Upon arrival to room pt reclined in bed, agreeable to tx. Pt requires bed controls and rails to exit bed x2 trials, MOD A return to bed. CGA + RW for ADL t/f, tolerates ~60 ft. Pt making good progress toward goals, will continue to follow POC. Discharge recommendation remains appropriate.        If plan is discharge home, recommend the following:  A little help with walking and/or transfers;A lot of help with bathing/dressing/bathroom   Equipment Recommendations  BSC/3in1    Recommendations for Other Services      Precautions / Restrictions Precautions Precautions: Fall Precaution Comments: trach collar/ Trach Restrictions Weight Bearing Restrictions: No       Mobility Bed Mobility Overal bed mobility: Needs Assistance Bed Mobility: Supine to Sit, Sit to Supine     Supine to sit: Supervision Sit to supine: Mod assist   General bed mobility comments: assist for BLE    Transfers Overall transfer level: Needs assistance Equipment used: Rolling walker (2 wheels) Transfers: Sit to/from Stand Sit to Stand: Contact guard assist, From elevated surface                 Balance Overall balance assessment: Needs assistance Sitting-balance support: Feet supported, No upper extremity supported Sitting balance-[name] Scale: Good     Standing balance support: Bilateral upper extremity supported Standing balance-[name] Scale: Fair Standing balance comment: Reliant on RW/AD for all standing  activity.                           ADL either performed or assessed with clinical judgement   ADL Overall ADL's : Needs assistance/impaired                                       General ADL Comments: MAX A don B shoes in sitting      Cognition Arousal: Alert Behavior During Therapy: WFL for tasks assessed/performed Overall Cognitive Status: Within Functional Limits for tasks assessed                                                     Pertinent Vitals/ Pain       Pain Assessment Pain Assessment: No/denies pain   Frequency  Min 1X/week        Progress Toward Goals  OT Goals(current goals can now be found in the care plan section)  Progress towards OT goals: Progressing toward goals  Acute Rehab OT Goals Patient Stated Goal: get stronger OT Goal Formulation: With patient Time For Goal Achievement: 02/11/23 Potential to Achieve Goals: Good ADL Goals Pt Will Perform Grooming: with modified independence;standing Pt Will Transfer to Toilet: with modified independence;ambulating;regular height toilet Pt Will Perform Toileting - Clothing Manipulation and hygiene: with mod assist;sit to/from stand  Plan Discharge plan remains appropriate;Frequency remains appropriate    Co-evaluation    PT/OT/SLP Co-Evaluation/Treatment: Yes Reason for Co-Treatment: To address functional/ADL transfers PT goals addressed during session: Mobility/safety with mobility;Strengthening/ROM;Proper use of DME;Balance OT goals addressed during session: ADL's and self-care      AM-PAC OT "6 Clicks" Daily Activity     Outcome Measure   Help from another person eating meals?: None Help from another person taking care of personal grooming?: A Little Help from another person toileting, which includes using toliet, bedpan, or urinal?: A Lot Help from another person bathing (including washing, rinsing, drying)?: A Lot Help from another person to  put on and taking off regular upper body clothing?: None Help from another person to put on and taking off regular lower body clothing?: A Lot 6 Click Score: 17    End of Session    OT Visit Diagnosis: Unsteadiness on feet (R26.81);Other abnormalities of gait and mobility (R26.89);Muscle weakness (generalized) (M62.81);Adult, failure to thrive (R62.7)   Activity Tolerance Patient tolerated treatment well   Patient Left in chair;with call bell/phone within reach   Nurse Communication          Time: 8119-1478 OT Time Calculation (min): 29 min  Charges: OT General Charges $OT Visit: 1 Visit OT Treatments $Self Care/Home Management : 8-22 mins  Kathie Dike, M.S. OTR/L  01/28/23, 1:36 PM  ascom [phone] 01/28/2023, 1:36 PM

## 2023-01-28 NOTE — Progress Notes (Signed)
Progress Note   Patient: Gary [name] FAO:[id number] DOB: Aug 19, 1955 DOA: 11/28/2022     59 DOS: the patient was seen and examined on 01/28/2023   Brief hospital course: ARVID DEGROOT is a 67 y.o. 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   Mr. Enderby stated that he ran out of his home torsemide several days ago due to a mixup with his pharmacy.  Then over the last couple days, he has noticed increasing lower extremity swelling, shortness of breath and chest pain.  He states that he usually develops chest pain when he has "fluid on his lungs."    Patient was initially diuresed with IV diuresis, responded well.  Significant improvement in peripheral edema.  IV Lasix was switched with home torsemide once creatinine started increasing.  Patient remained stable from respiratory standpoint point and remained on 6 to 8 L of supplemental oxygen via trach collar which is his baseline.  Patient developed Pseudomonas tracheitis, which was treated with Levaquin as he developed some pain around his tracheostomy.  Patient has end-stage COPD and need to continue follow-up with pulmonary as outpatient  9/25-10/7.  Placement pending. Patient needs a lot of assistance to get out of the bed. Appealing LTACH denial   10/07: ophthalmology saw him today and Dr. 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  Patient more short of breath today.  States the Lasix has not helped him.  Will give 100 mg of torsemide now and switch back to 40 mg torsemide twice daily.  Chest x-ray consistent with cardiomegaly and pulmonary vascular congestion with bibasilar opacities which may represent atelectasis, aspiration and/or pneumonia. 10/10.  Since patient's white blood cell count was normal and  procalcitonin was negative, will hold off on antibiotics since lungs improved with torsemide dosing yesterday. 10/11.  Patient feels that his swelling is a little bit better breathing is a little bit better.  Will start Lexapro for anxiety.  Lac-Hydrin lotion for dryness on his feet.       Assessment and Plan: * Acute on chronic diastolic CHF (congestive heart failure) ([name]) Continue torsemide 40 mg twice daily.  Continue spironolactone.  Check a BMP tomorrow  Acute on chronic respiratory failure with hypoxia and hypercapnia ([name]) Continue trach collar 6-7 L.  This morning was on 5-3/4 L.  Continue ventilator at night.  Patient will need prolonged weaning secondary to severe COPD with hypercapnic hypoxic respiratory failure and sleep apnea.  COPD (chronic obstructive pulmonary disease) ([name]) Severe COPD on Combivent, as needed albuterol.  On yuperli nebulizer.  Acute kidney injury superimposed on CKD ([name]) Acute kidney injury on CKD stage IIIa.  Creatinine 1.52 with diuresis.  (On 9/18 did have a creatinine of 1.17).  Creatinine will likely stay higher being on torsemide 40 mg twice a day.  Check BMP tomorrow.  Anxiety On Atarax as needed.  Continue Lexapro  OSA (obstructive sleep apnea) Continue home ventilator at night  Chest pain ACS ruled out.  Hypotension Off midodrine  Dry skin Add Lac-Hydrin lotion to feet.  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  Chronic colitis Continue home mesalamine  Generalized weakness Continue working with physical therapy and mobility specialist to work on getting out of the bed and ambulating.  Continue also doing exercises in the bed.  Morbid  obesity with BMI of 50.0-59.9, adult ([name]) BMI 51.83 with last height and weight in computer.        Subjective: Patient feeling better with regards to his breathing.  Trying to work hard with physical therapy.  Doing some exercises in the bed on his own.  Admitted 60 days ago With  shortness of breath Physical Exam: Vitals:   01/27/23 1620 01/27/23 2015 01/27/23 2349 01/28/23 0758  BP: 109/67  106/66 108/75  Pulse: 80  82 75  Resp: 16  16 16   Temp: 97.6 F (36.4 C)  98 F (36.7 C) 98.5 F (36.9 C)  TempSrc:   Oral   SpO2: 91% 93% 94% 92%  Weight:      Height:       Physical Exam HENT:     Head: Normocephalic.     Mouth/Throat:     Pharynx: No oropharyngeal exudate.  Eyes:     General: Lids are normal.     Conjunctiva/sclera: Conjunctivae normal.  Cardiovascular:     Rate and Rhythm: Normal rate and regular rhythm.     Heart sounds: Normal heart sounds, S1 normal and S2 normal.  Pulmonary:     Breath sounds: Examination of the right-lower field reveals decreased breath sounds. Examination of the left-lower field reveals decreased breath sounds. Decreased breath sounds present. No wheezing, rhonchi or rales.  Abdominal:     Palpations: Abdomen is soft.     Tenderness: There is no abdominal tenderness.  Musculoskeletal:     Right lower leg: Swelling present.     Left lower leg: Swelling present.  Skin:    General: Skin is warm.     Comments: Fungus toenails.  Scaling bilateral feet.  Neurological:     Mental Status: He is alert and oriented to person, place, and time.     Data Reviewed: Will check BMP tomorrow  Disposition: Status is: Inpatient Remains inpatient appropriate because: Not a candidate for LTAC, no rehab will take it.  Needs to get stronger in order to go home  Planned Discharge Destination: Home with Home Health    Time spent: 27 minutes  Author: Alford Highland, MD 01/28/2023 1:46 PM  For on call review [email or web address].
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
## 2023-01-29 DIAGNOSIS — J9621 Acute and chronic respiratory failure with hypoxia: Secondary | ICD-10-CM | POA: Diagnosis not present

## 2023-01-29 DIAGNOSIS — J439 Emphysema, unspecified: Secondary | ICD-10-CM | POA: Diagnosis not present

## 2023-01-29 DIAGNOSIS — I5033 Acute on chronic diastolic (congestive) heart failure: Secondary | ICD-10-CM | POA: Diagnosis not present

## 2023-01-29 DIAGNOSIS — N179 Acute kidney failure, unspecified: Secondary | ICD-10-CM | POA: Diagnosis not present

## 2023-01-29 LAB — BASIC METABOLIC PANEL
Anion gap: 9 (ref 5–15)
BUN: 38 mg/dL — ABNORMAL HIGH (ref 8–23)
CO2: 34 mmol/L — ABNORMAL HIGH (ref 22–32)
Calcium: 8.4 mg/dL — ABNORMAL LOW (ref 8.9–10.3)
Chloride: 96 mmol/L — ABNORMAL LOW (ref 98–111)
Creatinine, Ser: 1.48 mg/dL — ABNORMAL HIGH (ref 0.61–1.24)
GFR, Estimated: 52 mL/min — ABNORMAL LOW (ref >60)
Glucose, Bld: 87 mg/dL (ref 70–99)
Potassium: 4.2 mmol/L (ref 3.5–5.1)
Sodium: 139 mmol/L (ref 135–145)

## 2023-01-29 NOTE — Progress Notes (Signed)
Occupational Therapy Treatment Patient Details Name: Gary [name] MRN: [id number] DOB: 1955/04/22 Today's Date: 01/29/2023   History of present illness 67 y/o male presented to [facility] ED on 11/28/22 for chest pain x 4 days. Admitted for acute on chronic CHF. Frequent admissions this year with most recent dsicharge on 11/21/22. PMH includes obesity, hypoxia, respiratory failure with tracheostomy, COPD, GIB, HFpEF.   OT comments  Gary [name] was seen for OT treatment on this date. Upon arrival to room pt in bed, agreeable to tx. Pt requires no assist to exit bed x2 trials, MOD A return BLE to bed. SBA + RW standing from elevated bed height and walking ~20 ft to step onto standing scale. Completed seated therex pushing chair around unit with BLE. Pt making progress toward goals, will continue to follow POC. Discharge recommendation remains appropriate.        If plan is discharge home, recommend the following:  A little help with walking and/or transfers;A lot of help with bathing/dressing/bathroom   Equipment Recommendations  BSC/3in1    Recommendations for Other Services      Precautions / Restrictions Precautions Precautions: Fall Precaution Comments: trach collar/ Trach Restrictions Weight Bearing Restrictions: No RLE Weight Bearing: Weight bearing as tolerated       Mobility Bed Mobility Overal bed mobility: Needs Assistance Bed Mobility: Supine to Sit, Sit to Supine     Supine to sit: Supervision Sit to supine: Mod assist   General bed mobility comments: assist for BLE    Transfers Overall transfer level: Needs assistance Equipment used: Rolling walker (2 wheels) Transfers: Sit to/from Stand Sit to Stand: From elevated surface, Supervision                 Balance Overall balance assessment: Needs assistance Sitting-balance support: Feet supported, No upper extremity supported Sitting balance-[name] Scale: Good     Standing balance support: Bilateral upper  extremity supported Standing balance-[name] Scale: Fair                             ADL either performed or assessed with clinical judgement   ADL Overall ADL's : Needs assistance/impaired                                       General ADL Comments: MAX A don B shoes in sitting      Cognition Arousal: Alert Behavior During Therapy: WFL for tasks assessed/performed Overall Cognitive Status: Within Functional Limits for tasks assessed                                                     Pertinent Vitals/ Pain       Pain Assessment Pain Assessment: No/denies pain   Frequency  Min 1X/week        Progress Toward Goals  OT Goals(current goals can now be found in the care plan section)  Progress towards OT goals: Progressing toward goals  Acute Rehab OT Goals Patient Stated Goal: get stronger OT Goal Formulation: With patient Time For Goal Achievement: 02/11/23 Potential to Achieve Goals: Good ADL Goals Pt Will Perform Grooming: with modified independence;standing Pt Will Transfer to Toilet: with modified independence;ambulating;regular height toilet Pt Will Perform  Toileting - Clothing Manipulation and hygiene: with mod assist;sit to/from stand  Plan Discharge plan remains appropriate;Frequency remains appropriate    Co-evaluation    PT/OT/SLP Co-Evaluation/Treatment: Yes Reason for Co-Treatment: To address functional/ADL transfers PT goals addressed during session: Mobility/safety with mobility;Strengthening/ROM;Proper use of DME;Balance OT goals addressed during session: ADL's and self-care      AM-PAC OT "6 Clicks" Daily Activity     Outcome Measure   Help from another person eating meals?: None Help from another person taking care of personal grooming?: A Little Help from another person toileting, which includes using toliet, bedpan, or urinal?: A Lot Help from another person bathing (including washing,  rinsing, drying)?: A Lot Help from another person to put on and taking off regular upper body clothing?: None Help from another person to put on and taking off regular lower body clothing?: A Lot 6 Click Score: 17    End of Session    OT Visit Diagnosis: Unsteadiness on feet (R26.81);Other abnormalities of gait and mobility (R26.89);Muscle weakness (generalized) (M62.81);Adult, failure to thrive (R62.7)   Activity Tolerance Patient tolerated treatment well   Patient Left in chair;with call bell/phone within reach   Nurse Communication          Time: 7829-5621 OT Time Calculation (min): 39 min  Charges: OT General Charges $OT Visit: 1 Visit OT Treatments $Self Care/Home Management : 8-22 mins  Kathie Dike, M.S. OTR/L  01/29/23, 2:58 PM  ascom [phone]

## 2023-01-29 NOTE — Progress Notes (Signed)
Physical Therapy Treatment Patient Details Name: Gary [name] MRN: [id number] DOB: [birth year] Today's Date: 01/29/2023   History of Present Illness 67 y/o male presented to [facility] ED on 11/28/22 for chest pain x 4 days. Admitted for acute on chronic CHF. Frequent admissions this year with most recent dsicharge on 11/21/22. PMH includes obesity, hypoxia, respiratory failure with tracheostomy, COPD, GIB, HFpEF.    PT Comments  Pt received in supine position and agreeable to therapy.  Pt noted to be in supine position to allow for fluid to flow away from LE's.  Pt performed exiting of the bed x2 and required modA from therapists to get Le's back into the bed.  Pt with SBA from therapist and RW to stand from significantly heightened bed with pt able to ambulate ~20 feet to step onto standing scale.  Pt then returned to the recliner and performed seated quad pushes to push the recliner in the hallway.  Pt left in room with all needs met and call bell within reach.   If plan is discharge home, recommend the following: A lot of help with bathing/dressing/bathroom;Assist for transportation;A lot of help with walking and/or transfers;A little help with walking and/or transfers   Can travel by private vehicle        Equipment Recommendations   (toilet seat riser for [facility]?)    Recommendations for Other Services       Precautions / Restrictions Precautions Precautions: Fall Precaution Comments: trach collar/ Trach Restrictions Weight Bearing Restrictions: No     Mobility  Bed Mobility Overal bed mobility: Needs Assistance Bed Mobility: Supine to Sit, Sit to Supine     Supine to sit: Supervision Sit to supine: Mod assist   General bed mobility comments: assist for BLE    Transfers Overall transfer level: Needs assistance Equipment used: Rolling walker (2 wheels) Transfers: Sit to/from Stand Sit to Stand: From elevated surface, Supervision                 Ambulation/Gait Ambulation/Gait assistance: Contact guard assist Gait Distance (Feet): 20 Feet Assistive device: Rolling walker (2 wheels) Gait Pattern/deviations: WFL(Within Functional Limits) Gait velocity: decreased     General Gait Details: Pt able to ambulate ~20 feet with chair follow.   Stairs             Wheelchair Mobility     Tilt Bed    Modified Rankin (Stroke Patients Only)       Balance Overall balance assessment: Needs assistance Sitting-balance support: Feet supported, No upper extremity supported Sitting balance-[name] Scale: Good     Standing balance support: Bilateral upper extremity supported Standing balance-[name] Scale: Fair Standing balance comment: Reliant on RW/AD for all standing activity.                            Cognition Arousal: Alert Behavior During Therapy: WFL for tasks assessed/performed Overall Cognitive Status: Within Functional Limits for tasks assessed                                          Exercises      General Comments        Pertinent Vitals/Pain Pain Assessment Pain Assessment: No/denies pain    Home Living  Prior Function            PT Goals (current goals can now be found in the care plan section) Acute Rehab PT Goals Patient Stated Goal: Get better so I can go home PT Goal Formulation: With patient Time For Goal Achievement: 02/11/23 Potential to Achieve Goals: Good Progress towards PT goals: Progressing toward goals    Frequency    Min 1X/week      PT Plan Discharge plan needs to be updated    Co-evaluation   Reason for Co-Treatment: To address functional/ADL transfers PT goals addressed during session: Mobility/safety with mobility;Strengthening/ROM;Proper use of DME;Balance OT goals addressed during session: ADL's and self-care      AM-PAC PT "6 Clicks" Mobility   Outcome Measure  Help needed turning from  your back to your side while in a flat bed without using bedrails?: None Help needed moving from lying on your back to sitting on the side of a flat bed without using bedrails?: A Little Help needed moving to and from a bed to a chair (including a wheelchair)?: A Little Help needed standing up from a chair using your arms (e.g., wheelchair or bedside chair)?: A Little Help needed to walk in hospital room?: A Little Help needed climbing 3-5 steps with a railing? : Total 6 Click Score: 17    End of Session Equipment Utilized During Treatment: Oxygen Activity Tolerance: Patient tolerated treatment well;Patient limited by fatigue Patient left: with call bell/phone within reach;with nursing/sitter in room;in chair Nurse Communication: Mobility status PT Visit Diagnosis: Muscle weakness (generalized) (M62.81);Difficulty in walking, not elsewhere classified (R26.2)     Time: 4098-1191 PT Time Calculation (min) (ACUTE ONLY): 37 min  Charges:    $Therapeutic Activity: 23-37 mins PT General Charges $$ ACUTE PT VISIT: 1 Visit                     Nolon Bussing, PT, DPT Physical Therapist - [facility]  01/29/23, 3:53 PM

## 2023-01-29 NOTE — Care Management Important Message (Signed)
Important Message  Patient Details  Name: Gary [name] MRN: [id number] Date of Birth: 01-24-1956   Important Message Given:  Yes - Medicare IM     Gary [name], [name] 01/29/2023, 11:35 AM

## 2023-01-29 NOTE — Progress Notes (Signed)
Progress Note   Patient: Gary [name]:[id number] DOB: 22-Jan-1956 DOA: 11/28/2022     60 DOS: the patient was seen and examined on 01/29/2023   Brief hospital course: Gary [name] is a 67 y.o. 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   [name] stated that he ran out of his home torsemide several days ago due to a mixup with his pharmacy.  Then over the last couple days, he has noticed increasing lower extremity swelling, shortness of breath and chest pain.  He states that he usually develops chest pain when he has "fluid on his lungs."    Patient was initially diuresed with IV diuresis, responded well.  Significant improvement in peripheral edema.  IV Lasix was switched with home torsemide once creatinine started increasing.  Patient remained stable from respiratory standpoint point and remained on 6 to 8 L of supplemental oxygen via trach collar which is his baseline.  Patient developed Pseudomonas tracheitis, which was treated with Levaquin as he developed some pain around his tracheostomy.  Patient has end-stage COPD and need to continue follow-up with pulmonary as outpatient  9/25-10/7.  Placement pending. Patient needs a lot of assistance to get out of the bed. Appealing LTACH denial   10/07: ophthalmology saw him today and Dr. 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  Patient more short of breath today.  States the Lasix has not helped him.  Will give 100 mg of torsemide now and switch back to 40 mg torsemide twice daily.  Chest x-ray consistent with cardiomegaly and pulmonary vascular congestion with bibasilar opacities which may represent atelectasis, aspiration and/or pneumonia. 10/10.  Since patient's white blood cell count was normal and  procalcitonin was negative, will hold off on antibiotics since lungs improved with torsemide dosing yesterday. 10/11.  Patient feels that his swelling is a little bit better breathing is a little bit better.  Will start Lexapro for anxiety.  Lac-Hydrin lotion for dryness on his feet. 10/15.  Creatinine stable at 1.48 on torsemide 40 mg twice daily.  Patient down to 6 L trach collar during the day.  Tolerating Lexapro.       Assessment and Plan: * Acute on chronic diastolic CHF (congestive heart failure) ([name]) Continue torsemide 40 mg twice daily.  Continue spironolactone.  Creatinine stable at 1.48.  Acute on chronic respiratory failure with hypoxia and hypercapnia ([name]) Continue trach collar 6-7 L.  This morning was on 6 L.  Continue ventilator at night.  Patient will need prolonged weaning secondary to severe COPD with hypercapnic hypoxic respiratory failure and sleep apnea.  COPD (chronic obstructive pulmonary disease) ([name]) Severe COPD on Combivent, as needed albuterol.  On yuperli nebulizer.  Acute kidney injury superimposed on CKD ([name]) Acute kidney injury on CKD stage IIIa.  Creatinine 1.48 with diuresis.  (On 9/18 did have a creatinine of 1.17).  Creatinine will likely stay higher being on torsemide 40 mg twice a day.  Anxiety On Atarax as needed.  Continue Lexapro  OSA (obstructive sleep apnea) Continue home ventilator at night  Chest pain ACS ruled out.  Hypotension Off midodrine  Dry skin Lac-Hydrin lotion to feet.  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  Chronic colitis Continue home mesalamine  Generalized weakness Continue working with physical therapy and mobility  specialist to work on getting out of the bed and ambulating.  Continue also doing exercises in the bed.  Morbid obesity with BMI of 50.0-59.9, adult ([name]) BMI 51.83 with last height and weight in computer.  Asked nursing staff yesterday to get a weight today        Subjective:  Patient feeling okay.  Breathing is better than it was the other day.  Offers no complaints.  Admitted 61 days ago with shortness of breath  Physical Exam: Vitals:   01/28/23 1639 01/28/23 2057 01/29/23 0005 01/29/23 0752  BP: 133/86  111/74 95/69  Pulse: 76  78 77  Resp: 18  20 14   Temp: 98.3 F (36.8 C)  98.6 F (37 C) 97.9 F (36.6 C)  TempSrc:   Oral   SpO2: 94% (!) 89% 94% 93%  Weight:      Height:       Physical Exam HENT:     Head: Normocephalic.  Eyes:     General: Lids are normal.     Conjunctiva/sclera: Conjunctivae normal.  Cardiovascular:     Rate and Rhythm: Normal rate and regular rhythm.     Heart sounds: Normal heart sounds, S1 normal and S2 normal.  Pulmonary:     Breath sounds: Examination of the right-lower field reveals decreased breath sounds. Examination of the left-lower field reveals decreased breath sounds. Decreased breath sounds present. No wheezing, rhonchi or rales.  Abdominal:     Palpations: Abdomen is soft.     Tenderness: There is no abdominal tenderness.  Musculoskeletal:     Right lower leg: Swelling present.     Left lower leg: Swelling present.  Skin:    General: Skin is warm.     Comments: Fungus toenails.  Scaling bilateral feet.  Neurological:     Mental Status: He is alert and oriented to person, place, and time.     Data Reviewed: Creatinine 1.48, potassium 4.2  Disposition: Status is: Inpatient Remains inpatient appropriate because: Will have to be strong enough in order to go home.  Planned Discharge Destination: Eventually home with home health    Time spent: 27 minutes  Author: Alford Highland, MD 01/29/2023 1:08 PM  For on call review [email or web address].

## 2023-01-29 NOTE — TOC Progression Note (Signed)
Transition of Care [facility]) - Progression Note    Patient Details  Name: Gary [name] MRN: [id number] Date of Birth: 10/10/1955  Transition of Care [facility]) CM/SW Contact  Harriet Masson, RN Phone Number: 01/29/2023, 11:39 AM  Clinical Narrative:    Sherron Monday to ,Danford Bad , admission coordinator for [employer] community health and [facility].Danford Bad states the facility does not accept patient's with tracheostomies.    Expected Discharge Plan: Home w Home Health Services Barriers to Discharge: Continued Medical Work up  Expected Discharge Plan and Services     Post Acute Care Choice: Home Health                   DME Arranged: Bedside commode DME Agency: [employer] Date DME Agency Contacted: 01/12/23 Time DME Agency Contacted: 1427 Representative spoke with at DME Agency: [facility] Arranged: PT, OT HH Agency: [facility] Date [facility] Agency Contacted: 01/12/23 Time HH Agency Contacted: 1428 Representative spoke with at [facility] Agency: Kandee Keen   Social Determinants of Health (SDOH) Interventions SDOH Screenings   Food Insecurity: No Food Insecurity (12/01/2022)  Housing: Patient Declined (12/01/2022)  Transportation Needs: No Transportation Needs (12/01/2022)  Utilities: Not At Risk (12/01/2022)  Depression (PHQ2-9): Low Risk  (06/07/2021)  Financial Resource Strain: High Risk (11/21/2022)   Received from [facility]  Physical Activity: Insufficiently Active (05/03/2017)  Social Connections: Patient Declined (11/20/2022)   Received from [facility]  Stress: Stress Concern Present (11/20/2022)   Received from [facility]  Tobacco Use: Medium Risk (12/01/2022)    Readmission Risk Interventions    11/05/2022    2:59 PM 11/05/2022   12:16 PM 12/31/2021    8:58 AM  Readmission Risk Prevention Plan  Transportation Screening Complete Complete Complete  Medication Review Oceanographer) Complete Complete Complete  PCP or Specialist appointment within 3-5 days of discharge  Complete Complete Complete  HRI or Home Care Consult Complete Complete Complete  SW Recovery Care/Counseling Consult Complete Complete Complete  Palliative Care Screening Complete Complete Complete  Skilled Nursing Facility Not Applicable Not Applicable Not Applicable

## 2023-01-29 NOTE — TOC Progression Note (Signed)
Transition of Care [facility]) - Progression Note    Patient Details  Name: Gary [name] MRN: [id number] Date of Birth: 08-Jun-1955  Transition of Care [facility]) CM/SW Contact  Marlowe Sax, RN Phone Number: 01/29/2023, 11:10 AM  Clinical Narrative:     The patient is now on 6 liters on trach collar, down from 10, he has had some medication changes to aide in Anxiety  TOC continues to follow and will attempt to assist in DC planning Physical therapy will continue to work with him trying to get him stronger  Expected Discharge Plan: Home w Home Health Services Barriers to Discharge: Continued Medical Work up  Expected Discharge Plan and Services     Post Acute Care Choice: Home Health                   DME Arranged: Bedside commode DME Agency: [employer] Date DME Agency Contacted: 01/12/23 Time DME Agency Contacted: 1427 Representative spoke with at DME Agency: [facility] Arranged: PT, OT HH Agency: [facility] Date [facility] Agency Contacted: 01/12/23 Time HH Agency Contacted: 1428 Representative spoke with at [facility] Agency: Kandee Keen   Social Determinants of Health (SDOH) Interventions SDOH Screenings   Food Insecurity: No Food Insecurity (12/01/2022)  Housing: Patient Declined (12/01/2022)  Transportation Needs: No Transportation Needs (12/01/2022)  Utilities: Not At Risk (12/01/2022)  Depression (PHQ2-9): Low Risk  (06/07/2021)  Financial Resource Strain: High Risk (11/21/2022)   Received from [facility]  Physical Activity: Insufficiently Active (05/03/2017)  Social Connections: Patient Declined (11/20/2022)   Received from [facility]  Stress: Stress Concern Present (11/20/2022)   Received from [facility]  Tobacco Use: Medium Risk (12/01/2022)    Readmission Risk Interventions    11/05/2022    2:59 PM 11/05/2022   12:16 PM 12/31/2021    8:58 AM  Readmission Risk Prevention Plan  Transportation Screening Complete Complete Complete  Medication Review Furniture conservator/restorer) Complete Complete Complete  PCP or Specialist appointment within 3-5 days of discharge Complete Complete Complete  HRI or Home Care Consult Complete Complete Complete  SW Recovery Care/Counseling Consult Complete Complete Complete  Palliative Care Screening Complete Complete Complete  Skilled Nursing Facility Not Applicable Not Applicable Not Applicable

## 2023-01-30 DIAGNOSIS — I5033 Acute on chronic diastolic (congestive) heart failure: Secondary | ICD-10-CM | POA: Diagnosis not present

## 2023-01-30 NOTE — Progress Notes (Signed)
[facility]  - [place] [facility]   PATIENT NAME: Gary [name]    MR#:  [id number]  DATE OF BIRTH:  [birth year]  SUBJECTIVE:  patient seen earlier. Sitting upright in the bed. Ate his breakfast. Per PT was sleepy during his evaluation. Trying to wean down oxygen 26 L trick collar.   VITALS:  Blood pressure 105/77, pulse 75, temperature 98.2 F (36.8 C), resp. rate 14, height 6\' 4"  (1.93 m), weight (!) 193.1 kg, SpO2 90%.  PHYSICAL EXAMINATION:   GENERAL:  67 y.o.-year-old patient with no acute distress. Morbid obesity LUNGS: distant breath sounds bilaterally, no wheezing CARDIOVASCULAR: S1, S2 normal. No murmur   ABDOMEN: Soft, nontender, nondistended. Bowel sounds present.  EXTREMITIES: chronic trace edema b/l.    NEUROLOGIC: nonfocal  patient is alert and awake   LABORATORY PANEL:  CBC Recent Labs  Lab 01/26/23 0650  WBC 8.2  HGB 10.3*  HCT 39.1  PLT 209    Chemistries  Recent Labs  Lab 01/29/23 0618  NA 139  K 4.2  CL 96*  CO2 34*  GLUCOSE 87  BUN 38*  CREATININE 1.48*  CALCIUM 8.4*   Assessment and Plan  Acute on chronic diastolic CHF (congestive heart failure) ([name]) Continue torsemide 40 mg twice daily.  Continue spironolactone.  Creatinine stable at 1.48.   Acute on chronic respiratory failure with hypoxia and hypercapnia ([name]) Continue trach collar 6-7 L.  This morning was on 6 L.  Continue ventilator at night.  Patient will need prolonged weaning secondary to severe COPD with hypercapnic hypoxic respiratory failure and sleep apnea.   COPD (chronic obstructive pulmonary disease) ([name]) Severe COPD on Combivent, as needed albuterol.  On yuperli nebulizer.   Acute kidney injury superimposed on CKD ([name]) Acute kidney injury on CKD stage IIIa.  Creatinine 1.48 with diuresis.  (On 9/18 did have a creatinine of 1.17).  Creatinine will likely stay higher being on torsemide 40 mg twice a day.   Anxiety On Atarax as needed.  Continue  Lexapro   OSA (obstructive sleep apnea) Continue home ventilator at night   Chest pain ACS ruled out.   Hypotension Off midodrine   Dry skin Lac-Hydrin lotion to feet.   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   Chronic colitis Continue home mesalamine   Generalized weakness Continue working with physical therapy and mobility specialist to work on getting out of the bed and ambulating.  Continue also doing exercises in the bed.   Morbid obesity with BMI of 50.0-59.9, adult ([name]) BMI 51.83 with last height and weight in computer.  Asked nursing staff yesterday to get a weight today  Family communication : none Consults : pulmonary CODE STATUS: full DVT Prophylaxis : Lovenox Level of care: Med-Surg Status is: Inpatient Remains inpatient appropriate because: challenging placement!!    TOTAL TIME TAKING CARE OF THIS PATIENT: 25 minutes.  >50% time spent on counselling and coordination of care  Note: This dictation was prepared with Dragon dictation along with smaller phrase technology. Any transcriptional errors that result from this process are unintentional.  [name] M.D    [facility]   CC: Primary care physician; Shayne Alken, MD

## 2023-01-30 NOTE — Plan of Care (Signed)
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
## 2023-01-30 NOTE — Progress Notes (Addendum)
PT Cancellation Note  Patient Details Name: Gary [name] MRN: [id number] DOB: 03/21/1956   Cancelled Treatment:     PT attempt 3 x throughout the day, however pt presents with increased lethargy and unable to stay awake to safely participate. Messaged MD about increased lethargy. Will return tomorrow and continue to follow per current POC. Pt fell asleep while talking to Thereasa Parkin which is unlike him.    [name] 01/30/2023, 2:57 PM

## 2023-01-30 NOTE — Plan of Care (Signed)
Problem: Education: Goal: Knowledge of General Education information will improve Description: Including pain rating scale, medication(s)/side effects and non-pharmacologic comfort measures Outcome: Progressing   Problem: Activity: Goal: Risk for activity intolerance will decrease Outcome: Progressing   Problem: Nutrition: Goal: Adequate nutrition will be maintained Outcome: Progressing   Problem: Pain Managment: Goal: General experience of comfort will improve Outcome: Progressing   Problem: Skin Integrity: Goal: Risk for impaired skin integrity will decrease Outcome: Progressing

## 2023-01-31 DIAGNOSIS — I5033 Acute on chronic diastolic (congestive) heart failure: Secondary | ICD-10-CM | POA: Diagnosis not present

## 2023-01-31 NOTE — Plan of Care (Signed)
  Problem: Education: Goal: Knowledge of General Education information will improve Description: Including pain rating scale, medication(s)/side effects and non-pharmacologic comfort measures Outcome: Progressing   Problem: Clinical Measurements: Goal: Ability to maintain clinical measurements within normal limits will improve Outcome: Progressing   Problem: Clinical Measurements: Goal: Respiratory complications will improve Outcome: Progressing   Problem: Activity: Goal: Risk for activity intolerance will decrease Outcome: Progressing   Problem: Nutrition: Goal: Adequate nutrition will be maintained Outcome: Progressing   Problem: Coping: Goal: Level of anxiety will decrease Outcome: Progressing

## 2023-01-31 NOTE — Progress Notes (Signed)
Brief Nutrition Follow-Up Note  Wt Readings from Last 3 Encounters:  01/23/23 (!) 193.1 kg  11/12/22 (!) 197.2 kg  10/16/22 (!) 204.1 kg    Reviewed I/O's: -1.4 L x 24 hours and -19.6 L since 01/17/23  UOP: 1.4 L x 24 hours  Pt continues to eat well. Noted meal completions 100%.   Reviewed wt; pt has experienced a 5.4% wt loss over the past 3 months, which is favorable given pt's obesity.   Per TOC notes, still awaiting placement. Pt lost LTACH appeal. Continues to work on oxygenation to qualify for SNF placement.   Labs reviewed: CBGS: 111 (inpatient orders for glycemic control are none).    Body mass index is 51.83 kg/m. Patient meets criteria for obesity, class III based on current BMI. Obesity is a complex, chronic medical condition that is optimally managed by a multidisciplinary care team. Weight loss is not an ideal goal for an acute inpatient hospitalization. However, if further work-up for obesity is warranted, consider outpatient referral to [facility].    Current diet order is Heart healthy with 1.5 L fluid restriction (will liberalize to 2 gram for wider variety of meal selections), patient is consuming approximately 100% of meals at this time. Labs and medications reviewed.   No nutrition interventions warranted at this time. If nutrition issues arise, please consult RD.   Levada Schilling, RD, LDN, CDCES Registered Dietitian III Certified Diabetes Care and Education Specialist Please refer to [facility] for RD and/or RD on-call/weekend/after hours pager

## 2023-01-31 NOTE — Progress Notes (Addendum)
[facility]  - [place] [facility]   PATIENT NAME: Gary [name]    MR#:  [id number]  DATE OF BIRTH:  13-Feb-1956  SUBJECTIVE:  patient seen earlier. Trying to wean down oxygen 6 L trach collar.   VITALS:  Blood pressure 105/73, pulse 71, temperature 97.8 F (36.6 C), resp. rate 14, height 6\' 4"  (1.93 m), weight (!) 193.1 kg, SpO2 90%.  PHYSICAL EXAMINATION:   GENERAL:  67 y.o.-year-old patient with no acute distress. Morbid obesity LUNGS: distant breath sounds bilaterally, no wheezing CARDIOVASCULAR: S1, S2 normal. No murmur   ABDOMEN: Soft, nontender, nondistended. Bowel sounds present.  EXTREMITIES: chronic trace edema b/l.    NEUROLOGIC: nonfocal  patient is alert and awake   LABORATORY PANEL:  CBC Recent Labs  Lab 01/26/23 0650  WBC 8.2  HGB 10.3*  HCT 39.1  PLT 209    Chemistries  Recent Labs  Lab 01/29/23 0618  NA 139  K 4.2  CL 96*  CO2 34*  GLUCOSE 87  BUN 38*  CREATININE 1.48*  CALCIUM 8.4*   Assessment and Plan  Acute on chronic diastolic CHF (congestive heart failure) ([name]) Continue torsemide 40 mg twice daily.  Continue spironolactone.  Creatinine stable at 1.48.   Acute on chronic respiratory failure with hypoxia and hypercapnia ([name]) Continue trach collar 6-7 L.  This morning was on 6 L.  Continue ventilator at night.  Patient will need prolonged weaning secondary to severe COPD with hypercapnic hypoxic respiratory failure and sleep apnea.   COPD (chronic obstructive pulmonary disease) ([name]) Severe COPD on Combivent, as needed albuterol.  On yuperli nebulizer.   Acute kidney injury superimposed on CKD ([name]) Acute kidney injury on CKD stage IIIa.  Creatinine 1.48 with diuresis.  (On 9/18 did have a creatinine of 1.17).  Creatinine will likely stay higher being on torsemide 40 mg twice a day.   Anxiety On Atarax as needed.  Continue Lexapro   OSA (obstructive sleep apnea) Continue home ventilator at night   Chest  pain ACS ruled out.   Hypotension Off midodrine   Dry skin Lac-Hydrin lotion to feet.   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   Chronic colitis Continue home mesalamine   Generalized weakness Continue working with physical therapy and mobility specialist to work on getting out of the bed and ambulating.  Continue also doing exercises in the bed.   Morbid obesity with BMI of 50.0-59.9, adult ([name]) BMI 51.83   Family communication : none Consults : pulmonary CODE STATUS: full DVT Prophylaxis : Lovenox Level of care: Med-Surg Status is: Inpatient Remains inpatient appropriate because: challenging placement!!    TOTAL TIME TAKING CARE OF THIS PATIENT: 25 minutes.  >50% time spent on counselling and coordination of care  Note: This dictation was prepared with Dragon dictation along with smaller phrase technology. Any transcriptional errors that result from this process are unintentional.  [name] M.D    [facility]   CC: Primary care physician; Shayne Alken, MD

## 2023-01-31 NOTE — Progress Notes (Signed)
Physical Therapy Treatment Patient Details Name: Gary [name] MRN: [id number] DOB: 02/22/1956 Today's Date: 01/31/2023   History of Present Illness 67 y/o male presented to [facility] ED on 11/28/22 for chest pain x 4 days. Admitted for acute on chronic CHF. Frequent admissions this year with most recent dsicharge on 11/21/22. PMH includes obesity, hypoxia, respiratory failure with tracheostomy, COPD, GIB, HFpEF.    PT Comments  Pt continues to like to dictate session progression and requires increased time to perform all desired task. Pt was on 6 L trach collar throughout. He did tolerate standing and ambulation ~ 100 ft into hallway but was overall limited by fatigue.  One occasion of desaturation to 86% that recovers with prolonged seated rest. Pt only tolerated sitting in recliner for ~ 1 hour prior to needing to be hoyer lifted back to bed from recliner. Acute PT will continue to follow per current POC.    If plan is discharge home, recommend the following: A lot of help with bathing/dressing/bathroom;Assist for transportation;A lot of help with walking and/or transfers;A little help with walking and/or transfers   Can travel by private vehicle     No  Equipment Recommendations   (lift chair)       Precautions / Restrictions Precautions Precautions: Fall Precaution Comments: trach collar/ Trach Restrictions Weight Bearing Restrictions: Yes RLE Weight Bearing: Weight bearing as tolerated     Mobility  Bed Mobility Overal bed mobility: Needs Assistance Bed Mobility: Supine to Sit, Sit to Supine Rolling: Supervision (uses momentum to achieve) Supine to sit: Supervision Sit to supine: Mod assist   Transfers Overall transfer level: Needs assistance Equipment used: Rolling walker (2 wheels) Transfers: Sit to/from Stand Sit to Stand: From elevated surface, Supervision (very elevated ebd height)  General transfer comment: pt still remains unwilling to stand from non elevated bed  height    Ambulation/Gait Ambulation/Gait assistance: Contact guard assist Gait Distance (Feet): 100 Feet Assistive device: Rolling walker (2 wheels) Gait Pattern/deviations: WFL(Within Functional Limits) Gait velocity: decreased  General Gait Details: chair follow. pt took 1 standing rest ~ 93ft in   Balance Overall balance assessment: Needs assistance Sitting-balance support: Feet supported, No upper extremity supported Sitting balance-[name] Scale: Good     Standing balance support: Bilateral upper extremity supported Standing balance-[name] Scale: Fair       Cognition Arousal: Alert Behavior During Therapy: WFL for tasks assessed/performed Overall Cognitive Status: Within Functional Limits for tasks assessed      General Comments: Pt is A and O x 4           General Comments General comments (skin integrity, edema, etc.): pt on 6 L trach collar. did desaturate to 86% one time but recovers with seated rest      Pertinent Vitals/Pain Pain Assessment Pain Assessment: No/denies pain     PT Goals (current goals can now be found in the care plan section) Acute Rehab PT Goals Patient Stated Goal: Get better so I can go home Progress towards PT goals: Progressing toward goals    Frequency    Min 1X/week      PT Plan Current plan remains appropriate    Co-evaluation     PT goals addressed during session: Mobility/safety with mobility;Balance;Proper use of DME;Strengthening/ROM        AM-PAC PT "6 Clicks" Mobility   Outcome Measure  Help needed turning from your back to your side while in a flat bed without using bedrails?: None Help needed moving from lying on your  back to sitting on the side of a flat bed without using bedrails?: None Help needed moving to and from a bed to a chair (including a wheelchair)?: A Lot Help needed standing up from a chair using your arms (e.g., wheelchair or bedside chair)?: A Little Help needed to walk in hospital room?: A  Little Help needed climbing 3-5 steps with a railing? : Total 6 Click Score: 17    End of Session Equipment Utilized During Treatment: Oxygen (6L trach colar) Activity Tolerance: Patient tolerated treatment well;Patient limited by fatigue Patient left: in bed;with call bell/phone within reach;with bed alarm set;with nursing/sitter in room Nurse Communication: Mobility status PT Visit Diagnosis: Muscle weakness (generalized) (M62.81);Difficulty in walking, not elsewhere classified (R26.2)     Time: 5621-3086 PT Time Calculation (min) (ACUTE ONLY): 30 min  Charges:    $Gait Training: 8-22 mins $Therapeutic Activity: 8-22 mins PT General Charges $$ ACUTE PT VISIT: 1 Visit                     Jetta Lout PTA 01/31/23, 5:23 PM

## 2023-02-01 DIAGNOSIS — I5033 Acute on chronic diastolic (congestive) heart failure: Secondary | ICD-10-CM | POA: Diagnosis not present

## 2023-02-01 MED ORDER — AMPHETAMINE-DEXTROAMPHETAMINE 10 MG PO TABS
20.0000 mg | ORAL_TABLET | Freq: Every day | ORAL | Status: DC
  Administered 2023-02-01 – 2023-02-02 (×2): 20 mg via ORAL
  Filled 2023-02-01 (×2): qty 2

## 2023-02-01 MED ORDER — AMPHETAMINE-DEXTROAMPHETAMINE 10 MG PO TABS: 20.0000 mg | ORAL_TABLET | Freq: Every day | ORAL | Status: DC

## 2023-02-01 NOTE — Plan of Care (Signed)
  Problem: Education: Goal: Knowledge of General Education information will improve Description: Including pain rating scale, medication(s)/side effects and non-pharmacologic comfort measures Outcome: Progressing   Problem: Health Behavior/Discharge Planning: Goal: Ability to manage health-related needs will improve Outcome: Progressing   Problem: Clinical Measurements: Goal: Ability to maintain clinical measurements within normal limits will improve Outcome: Progressing Goal: Will remain free from infection Outcome: Progressing Goal: Diagnostic test results will improve Outcome: Progressing Goal: Respiratory complications will improve Outcome: Progressing Goal: Cardiovascular complication will be avoided Outcome: Progressing   Problem: Activity: Goal: Risk for activity intolerance will decrease Outcome: Progressing   Problem: Nutrition: Goal: Adequate nutrition will be maintained Outcome: Progressing   Problem: Coping: Goal: Level of anxiety will decrease Outcome: Progressing   Problem: Elimination: Goal: Will not experience complications related to bowel motility Outcome: Progressing Goal: Will not experience complications related to urinary retention Outcome: Progressing   Problem: Pain Managment: Goal: General experience of comfort will improve Outcome: Progressing   Problem: Safety: Goal: Ability to remain free from injury will improve Outcome: Progressing   Problem: Skin Integrity: Goal: Risk for impaired skin integrity will decrease Outcome: Progressing   Problem: Education: Goal: Understanding of post-operative needs will improve Outcome: Progressing Goal: Individualized Educational Video(s) Outcome: Progressing   Problem: Clinical Measurements: Goal: Postoperative complications will be avoided or minimized Outcome: Progressing   Problem: Respiratory: Goal: Will regain and/or maintain adequate ventilation Outcome: Progressing

## 2023-02-01 NOTE — Care Management Important Message (Signed)
Important Message  Patient Details  Name: ESSIEN BAHL MRN: [id number] Date of Birth: [birth year]   Important Message Given:  Yes - Medicare IM     Demyan Fugate, [name] 02/01/2023, 1:59 PM

## 2023-02-01 NOTE — Plan of Care (Signed)
CHL Tonsillectomy/Adenoidectomy, Postoperative PEDS care plan entered in error.

## 2023-02-01 NOTE — TOC Progression Note (Signed)
Transition of Care [facility]) - Progression Note    Patient Details  Name: Gary [name] MRN: [id number] Date of Birth: 1955-11-21  Transition of Care [facility]) CM/SW Contact  Marlowe Sax, RN Phone Number: 02/01/2023, 2:47 PM  Clinical Narrative:     The patient is not as ambulatory as has been, he reports feeling more tired, Ins will not cover going to LTC, can not find a STR to accept him with his complex needs and trach  Expected Discharge Plan: Home w Home Health Services Barriers to Discharge: Continued Medical Work up  Expected Discharge Plan and Services     Post Acute Care Choice: Home Health                   DME Arranged: Bedside commode DME Agency: [employer] Date DME Agency Contacted: 01/12/23 Time DME Agency Contacted: [phone] Representative spoke with at DME Agency: [facility] Arranged: PT, OT HH Agency: [facility] Date [facility] Agency Contacted: 01/12/23 Time HH Agency Contacted: [phone] Representative spoke with at [facility] Agency: Kandee Keen   Social Determinants of Health (SDOH) Interventions SDOH Screenings   Food Insecurity: No Food Insecurity (12/01/2022)  Housing: Patient Declined (12/01/2022)  Transportation Needs: No Transportation Needs (12/01/2022)  Utilities: Not At Risk (12/01/2022)  Depression (PHQ2-9): Low Risk  (06/07/2021)  Financial Resource Strain: High Risk (11/21/2022)   Received from [facility]  Physical Activity: Insufficiently Active (05/03/2017)  Social Connections: Patient Declined (11/20/2022)   Received from [facility]  Stress: Stress Concern Present (11/20/2022)   Received from [facility]  Tobacco Use: Medium Risk (12/01/2022)    Readmission Risk Interventions    11/05/2022    2:59 PM 11/05/2022   12:16 PM 12/31/2021    8:58 AM  Readmission Risk Prevention Plan  Transportation Screening Complete Complete Complete  Medication Review [employer]) Complete Complete Complete  PCP or Specialist appointment within 3-5 days of  discharge Complete Complete Complete  HRI or Home Care Consult Complete Complete Complete  SW Recovery Care/Counseling Consult Complete Complete Complete  Palliative Care Screening Complete Complete Complete  Skilled Nursing Facility Not Applicable Not Applicable Not Applicable

## 2023-02-01 NOTE — Plan of Care (Signed)
  Problem: Education: Goal: Knowledge of General Education information will improve Description: Including pain rating scale, medication(s)/side effects and non-pharmacologic comfort measures Outcome: Progressing   Problem: Health Behavior/Discharge Planning: Goal: Ability to manage health-related needs will improve Outcome: Progressing   Problem: Nutrition: Goal: Adequate nutrition will be maintained Outcome: Progressing   Problem: Coping: Goal: Level of anxiety will decrease Outcome: Progressing   Problem: Pain Managment: Goal: General experience of comfort will improve Outcome: Progressing   
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
## 2023-02-01 NOTE — Progress Notes (Signed)
Progress Note    Gary [name]  XBJ:[id number] DOB: 1955-09-12  DOA: 11/28/2022 PCP: Shayne Alken, MD      Brief Narrative:    Medical records reviewed and are as summarized below:   Gary [name] is a 67 y.o. 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   Gary [name] stated that he ran out of his home torsemide several days ago due to a mixup with his pharmacy.  Then over the last couple days, he has noticed increasing lower extremity swelling, shortness of breath and chest pain.  He states that he usually develops chest pain when he has "fluid on his lungs."    Patient was initially diuresed with IV diuresis, responded well.  Significant improvement in peripheral edema.  IV Lasix was switched with home torsemide once creatinine started increasing.  Patient remained stable from respiratory standpoint point and remained on 6 to 8 L of supplemental oxygen via trach collar which is his baseline.  Patient developed Pseudomonas tracheitis, which was treated with Levaquin as he developed some pain around his tracheostomy.  Patient has end-stage COPD and need to continue follow-up with pulmonary as outpatient  9/25-10/7.  Placement pending. Patient needs a lot of assistance to get out of the bed. Appealing LTACH denial   10/07: ophthalmology saw him today and Gary [name] drained hydrocystoma on R eyelid  10/9.  Patient more short of breath today.  States the Lasix has not helped him.  Will give 100 mg of torsemide now and switch back to 40 mg torsemide twice daily.  Chest x-ray consistent with cardiomegaly and pulmonary vascular congestion with bibasilar opacities which may represent atelectasis, aspiration and/or pneumonia. 10/10.  Since patient's  white blood cell count was normal and procalcitonin was negative, will hold off on antibiotics since lungs improved with torsemide dosing yesterday. 10/11.  Patient feels that his swelling is a little bit better breathing is a little bit better.  Will start Lexapro for anxiety.  Lac-Hydrin lotion for dryness on his feet. 10/15.  Creatinine stable at 1.48 on torsemide 40 mg twice daily.  Patient down to 6 L trach collar during the day.  Tolerating Lexapro.           Assessment/Plan:   Principal Problem:   Acute on chronic diastolic CHF (congestive heart failure) ([name]) Active Problems:   Acute on chronic respiratory failure with hypoxia and hypercapnia ([name])   COPD (chronic obstructive pulmonary disease) ([name])   Acute kidney injury superimposed on CKD ([name])   OSA (obstructive sleep apnea)   Anxiety   Hypotension   Chest pain   Morbid obesity with BMI of 50.0-59.9, adult ([name])   Generalized weakness   Chronic colitis   Fluid overload   Eyelid cyst, right   Dry skin    Body mass index is 51.83 kg/m. (Morbid obesity):     Acute on chronic diastolic CHF (congestive heart failure) ([name]) Continue torsemide and spironolactone.  Creatinine is stable   Acute on chronic respiratory failure with hypoxia and hypercapnia ([name]) He is tolerating 6 L/min oxygen via trach collar and this is his baseline.     COPD (chronic obstructive pulmonary disease) ([name]) Severe COPD on Combivent, as needed albuterol.  On yuperli nebulizer.   Acute kidney injury  superimposed on CKD ([name]) Acute kidney injury on CKD stage IIIa.  Creatinine is stable.     Anxiety On Atarax as needed.  Continue Lexapro   OSA (obstructive sleep apnea) Continue home ventilator at night   Chest pain Resolved.  ACS ruled out.   Hypotension Resolved.  Off midodrine   Dry skin Lac-Hydrin lotion to feet.   Eyelid cyst, right Lanced by ophthalmology on 01/22/2023.   Chronic colitis Continue home mesalamine    Generalized weakness Continue PT as able    Diet Order             Diet 2 gram sodium Fluid consistency: Thin; Fluid restriction: 1500 mL Fluid  Diet effective now                            Consultants: Ophthalmologist Otolaryngologist Pulmonologist  Procedures: Expression of fluid from hydrocystoma of right eyelid on 01/22/2023    Medications:    acidophilus  2 capsule Oral BID   [START ON 02/02/2023] amphetamine-dextroamphetamine  20 mg Oral Q breakfast   arformoterol  15 mcg Nebulization BID   bacitracin   Topical Daily   docusate sodium  100 mg Oral Daily   enoxaparin (LOVENOX) injection  100 mg Subcutaneous Q24H   escitalopram  10 mg Oral Daily   ferrous sulfate  325 mg Oral Q breakfast   Gerhardt's butt cream   Topical Daily   guaiFENesin  1,200 mg Oral BID   Ipratropium-Albuterol  1 puff Inhalation Q6H   Mesalamine  800 mg Oral BID   revefenacin  175 mcg Nebulization Daily   spironolactone  50 mg Oral Daily   torsemide  40 mg Oral BID   Continuous Infusions:   Anti-infectives (From admission, onward)    Start     Dose/Rate Route Frequency Ordered Stop   01/02/23 0900  sulfamethoxazole-trimethoprim (BACTRIM) 400-80 MG per tablet 1 tablet  Status:  Discontinued        1 tablet Oral Once per day on Monday Wednesday Friday 01/01/23 1207 01/12/23 1002   12/03/22 1615  levofloxacin (LEVAQUIN) tablet 750 mg        750 mg Oral Daily 12/03/22 1520 12/07/22 0831              Family Communication/Anticipated D/C date and plan/Code Status   DVT prophylaxis:      Code Status: Full Code  Family Communication: None Disposition Plan: Plan to discharge home versus SNF   Status is: Inpatient Remains inpatient appropriate because: Awaiting placement       Subjective:   Interval events noted.  He has no complaints.  No shortness of breath or chest pain.  Objective:    Vitals:   01/31/23 2004 02/01/23 0004 02/01/23 0754  02/01/23 0805  BP:  107/73  111/72  Pulse:  77  79  Resp:  18  14  Temp:  98.1 F (36.7 C)  98.1 F (36.7 C)  TempSrc:      SpO2: 91% 92% 92% 91%  Weight:      Height:       No data found.   Intake/Output Summary (Last 24 hours) at 02/01/2023 1058 Last data filed at 02/01/2023 0648 Gross per 24 hour  Intake --  Output 2050 ml  Net -2050 ml   Filed Weights   01/23/23 1304  Weight: (!) 193.1 kg    Exam:  GEN: NAD SKIN: Warm and dry EYES: No pallor or  icterus ENT: MMM, + trach CV: RRR PULM: CTA B ABD: soft, obese, NT, +BS CNS: AAO x 3, non focal EXT: Bilateral leg edema, no tenderness      Data Reviewed:   I have personally reviewed following labs and imaging studies:  Labs: Labs show the following:   Basic Metabolic Panel: Recent Labs  Lab 01/26/23 0650 01/29/23 0618  NA 138 139  K 3.8 4.2  CL 94* 96*  CO2 35* 34*  GLUCOSE 123* 87  BUN 38* 38*  CREATININE 1.52* 1.48*  CALCIUM 8.1* 8.4*   GFR Estimated Creatinine Clearance: 91 mL/min (A) (by C-G formula based on SCr of 1.48 mg/dL (H)). Liver Function Tests: No results for input(s): "AST", "ALT", "ALKPHOS", "BILITOT", "PROT", "ALBUMIN" in the last 168 hours. No results for input(s): "LIPASE", "AMYLASE" in the last 168 hours. No results for input(s): "AMMONIA" in the last 168 hours. Coagulation profile No results for input(s): "INR", "PROTIME" in the last 168 hours.  CBC: Recent Labs  Lab 01/26/23 0650  WBC 8.2  HGB 10.3*  HCT 39.1  MCV 78.7*  PLT 209   Cardiac Enzymes: No results for input(s): "CKTOTAL", "CKMB", "CKMBINDEX", "TROPONINI" in the last 168 hours. BNP (last 3 results) No results for input(s): "PROBNP" in the last 8760 hours. CBG: No results for input(s): "GLUCAP" in the last 168 hours. D-Dimer: No results for input(s): "DDIMER" in the last 72 hours. Hgb A1c: No results for input(s): "HGBA1C" in the last 72 hours. Lipid Profile: No results for input(s): "CHOL",  "HDL", "LDLCALC", "TRIG", "CHOLHDL", "LDLDIRECT" in the last 72 hours. Thyroid function studies: No results for input(s): "TSH", "T4TOTAL", "T3FREE", "THYROIDAB" in the last 72 hours.  Invalid input(s): "FREET3" Anemia work up: No results for input(s): "VITAMINB12", "FOLATE", "FERRITIN", "TIBC", "IRON", "RETICCTPCT" in the last 72 hours. Sepsis Labs: Recent Labs  Lab 01/26/23 0650  WBC 8.2    Microbiology No results found for this or any previous visit (from the past 240 hour(s)).  Procedures and diagnostic studies:  No results found.             LOS: 63 days   Elicia Lui  [employer] loss adjuster on [email or web address]. If 7PM-7AM, please contact night-coverage at [email or web address]     02/01/2023, 10:58 AM

## 2023-02-02 DIAGNOSIS — I5033 Acute on chronic diastolic (congestive) heart failure: Secondary | ICD-10-CM | POA: Diagnosis not present

## 2023-02-02 MED ORDER — AMPHETAMINE-DEXTROAMPHETAMINE 10 MG PO TABS
10.0000 mg | ORAL_TABLET | Freq: Once | ORAL | Status: AC
  Administered 2023-02-02: 10 mg via ORAL
  Filled 2023-02-02: qty 1

## 2023-02-02 MED ORDER — AMPHETAMINE-DEXTROAMPHETAMINE 10 MG PO TABS
30.0000 mg | ORAL_TABLET | Freq: Every day | ORAL | Status: DC
  Administered 2023-02-03 – 2023-03-10 (×36): 30 mg via ORAL
  Filled 2023-02-02 (×38): qty 3

## 2023-02-02 NOTE — Plan of Care (Signed)
Patient did well today.  No change from prior.  Respiratory status stable

## 2023-02-02 NOTE — Plan of Care (Signed)

## 2023-02-02 NOTE — Progress Notes (Signed)
Progress Note    Gary [name]  QMV:[id number] DOB: 09/25/1955  DOA: 11/28/2022 PCP: Shayne Alken, MD      Brief Narrative:    Medical records reviewed and are as summarized below:   Gary [name] is a 67 y.o. 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   Gary [name] stated that he ran out of his home torsemide several days ago due to a mixup with his pharmacy.  Then over the last couple days, he has noticed increasing lower extremity swelling, shortness of breath and chest pain.  He states that he usually develops chest pain when he has "fluid on his lungs."    Patient was initially diuresed with IV diuresis, responded well.  Significant improvement in peripheral edema.  IV Lasix was switched with home torsemide once creatinine started increasing.  Patient remained stable from respiratory standpoint point and remained on 6 to 8 L of supplemental oxygen via trach collar which is his baseline.  Patient developed Pseudomonas tracheitis, which was treated with Levaquin as he developed some pain around his tracheostomy.  Patient has end-stage COPD and need to continue follow-up with pulmonary as outpatient  9/25-10/7.  Placement pending. Patient needs a lot of assistance to get out of the bed. Appealing LTACH denial   10/07: ophthalmology saw him today and Dr. 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  Patient more short of breath today.  States the Lasix has not helped him.  Will give 100 mg of torsemide now and switch back to 40 mg torsemide twice daily.  Chest x-ray consistent with cardiomegaly and pulmonary vascular congestion with bibasilar opacities which may represent atelectasis, aspiration and/or pneumonia. 10/10.  Since patient's  white blood cell count was normal and procalcitonin was negative, will hold off on antibiotics since lungs improved with torsemide dosing yesterday. 10/11.  Patient feels that his swelling is a little bit better breathing is a little bit better.  Will start Lexapro for anxiety.  Lac-Hydrin lotion for dryness on his feet. 10/15.  Creatinine stable at 1.48 on torsemide 40 mg twice daily.  Patient down to 6 L trach collar during the day.  Tolerating Lexapro.           Assessment/Plan:   Principal Problem:   Acute on chronic diastolic CHF (congestive heart failure) ([name]) Active Problems:   Acute on chronic respiratory failure with hypoxia and hypercapnia ([name])   COPD (chronic obstructive pulmonary disease) ([name])   Acute kidney injury superimposed on CKD ([name])   OSA (obstructive sleep apnea)   Anxiety   Hypotension   Chest pain   Morbid obesity with BMI of 50.0-59.9, adult ([name])   Generalized weakness   Chronic colitis   Fluid overload   Eyelid cyst, right   Dry skin    Body mass index is 51.83 kg/m. (Morbid obesity):     Acute on chronic diastolic CHF (congestive heart failure) ([name]) Continue spironolactone and torsemide.  Repeat BMP tomorrow.   Acute on chronic respiratory failure with hypoxia and hypercapnia ([name]) He is tolerating 6 L/min oxygen via trach collar and this is his baseline.     COPD (chronic obstructive pulmonary disease) ([name]) Severe COPD on Combivent, as needed albuterol.  On yuperli nebulizer.   Acute kidney injury  superimposed on CKD ([name]) Acute kidney injury on CKD stage IIIa.  Creatinine is stable.     Anxiety On Atarax as needed.  Continue Lexapro   OSA (obstructive sleep apnea) Continue home ventilator at night   Chest pain Resolved.  ACS ruled out.   Hypotension Resolved.  Off midodrine   Dry skin Lac-Hydrin lotion to feet.   Eyelid cyst, right Lanced by ophthalmology on 01/22/2023.   Chronic colitis Continue home mesalamine    Generalized weakness Continue PT as able    Diet Order             Diet 2 gram sodium Fluid consistency: Thin; Fluid restriction: 1500 mL Fluid  Diet effective now                            Consultants: Ophthalmologist Otolaryngologist Pulmonologist  Procedures: Expression of fluid from hydrocystoma of right eyelid on 01/22/2023    Medications:    acidophilus  2 capsule Oral BID   amphetamine-dextroamphetamine  20 mg Oral Q breakfast   arformoterol  15 mcg Nebulization BID   bacitracin   Topical Daily   docusate sodium  100 mg Oral Daily   enoxaparin (LOVENOX) injection  100 mg Subcutaneous Q24H   escitalopram  10 mg Oral Daily   ferrous sulfate  325 mg Oral Q breakfast   Gerhardt's butt cream   Topical Daily   guaiFENesin  1,200 mg Oral BID   Ipratropium-Albuterol  1 puff Inhalation Q6H   Mesalamine  800 mg Oral BID   revefenacin  175 mcg Nebulization Daily   spironolactone  50 mg Oral Daily   torsemide  40 mg Oral BID   Continuous Infusions:   Anti-infectives (From admission, onward)    Start     Dose/Rate Route Frequency Ordered Stop   01/02/23 0900  sulfamethoxazole-trimethoprim (BACTRIM) 400-80 MG per tablet 1 tablet  Status:  Discontinued        1 tablet Oral Once per day on Monday Wednesday Friday 01/01/23 1207 01/12/23 1002   12/03/22 1615  levofloxacin (LEVAQUIN) tablet 750 mg        750 mg Oral Daily 12/03/22 1520 12/07/22 0831              Family Communication/Anticipated D/C date and plan/Code Status   DVT prophylaxis:      Code Status: Full Code  Family Communication: None Disposition Plan: Plan to discharge home versus SNF   Status is: Inpatient Remains inpatient appropriate because: Awaiting placement       Subjective:   Interval events noted.  He feels well and has no complaints.  No shortness of breath or chest pain.  Objective:    Vitals:   02/01/23 1622 02/01/23 2119 02/02/23 0750 02/02/23 0816   BP: 115/70   107/76  Pulse: 81   80  Resp: 14   17  Temp: 98.3 F (36.8 C)   98.3 F (36.8 C)  TempSrc:      SpO2: 91% 92% 90% 93%  Weight:      Height:       No data found.   Intake/Output Summary (Last 24 hours) at 02/02/2023 1220 Last data filed at 02/02/2023 0900 Gross per 24 hour  Intake 480 ml  Output 1800 ml  Net -1320 ml   Filed Weights   01/23/23 1304  Weight: (!) 193.1 kg    Exam:  GEN: NAD SKIN: Warm and dry EYES: EOMI ENT:  MMM, +trach CV: RRR PULM: CTA B ABD: soft, ND, NT, +BS CNS: AAO x 3, non focal EXT: Mild b/l leg edema, no erythema or tenderness      Data Reviewed:   I have personally reviewed following labs and imaging studies:  Labs: Labs show the following:   Basic Metabolic Panel: Recent Labs  Lab 01/29/23 0618  NA 139  K 4.2  CL 96*  CO2 34*  GLUCOSE 87  BUN 38*  CREATININE 1.48*  CALCIUM 8.4*   GFR Estimated Creatinine Clearance: 91 mL/min (A) (by C-G formula based on SCr of 1.48 mg/dL (H)). Liver Function Tests: No results for input(s): "AST", "ALT", "ALKPHOS", "BILITOT", "PROT", "ALBUMIN" in the last 168 hours. No results for input(s): "LIPASE", "AMYLASE" in the last 168 hours. No results for input(s): "AMMONIA" in the last 168 hours. Coagulation profile No results for input(s): "INR", "PROTIME" in the last 168 hours.  CBC: No results for input(s): "WBC", "NEUTROABS", "HGB", "HCT", "MCV", "PLT" in the last 168 hours.  Cardiac Enzymes: No results for input(s): "CKTOTAL", "CKMB", "CKMBINDEX", "TROPONINI" in the last 168 hours. BNP (last 3 results) No results for input(s): "PROBNP" in the last 8760 hours. CBG: No results for input(s): "GLUCAP" in the last 168 hours. D-Dimer: No results for input(s): "DDIMER" in the last 72 hours. Hgb A1c: No results for input(s): "HGBA1C" in the last 72 hours. Lipid Profile: No results for input(s): "CHOL", "HDL", "LDLCALC", "TRIG", "CHOLHDL", "LDLDIRECT" in the last 72  hours. Thyroid function studies: No results for input(s): "TSH", "T4TOTAL", "T3FREE", "THYROIDAB" in the last 72 hours.  Invalid input(s): "FREET3" Anemia work up: No results for input(s): "VITAMINB12", "FOLATE", "FERRITIN", "TIBC", "IRON", "RETICCTPCT" in the last 72 hours. Sepsis Labs: No results for input(s): "PROCALCITON", "WBC", "LATICACIDVEN" in the last 168 hours.   Microbiology No results found for this or any previous visit (from the past 240 hour(s)).  Procedures and diagnostic studies:  No results found.             LOS: 64 days   Tarrin Menn  [employer] loss adjuster on [email or web address]. If 7PM-7AM, please contact night-coverage at [email or web address]     02/02/2023, 12:20 PM

## 2023-02-02 NOTE — Progress Notes (Signed)
Patient requested to go on home vent early. Placed patient on home vent per home settings.

## 2023-02-02 NOTE — Progress Notes (Signed)
Pt taken off home vent & placed back to 28% ATC
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
## 2023-02-03 DIAGNOSIS — I5033 Acute on chronic diastolic (congestive) heart failure: Secondary | ICD-10-CM | POA: Diagnosis not present

## 2023-02-03 LAB — BASIC METABOLIC PANEL
Anion gap: 10 (ref 5–15)
BUN: 34 mg/dL — ABNORMAL HIGH (ref 8–23)
CO2: 31 mmol/L (ref 22–32)
Calcium: 8 mg/dL — ABNORMAL LOW (ref 8.9–10.3)
Chloride: 93 mmol/L — ABNORMAL LOW (ref 98–111)
Creatinine, Ser: 1.52 mg/dL — ABNORMAL HIGH (ref 0.61–1.24)
GFR, Estimated: 51 mL/min — ABNORMAL LOW (ref >60)
Glucose, Bld: 131 mg/dL — ABNORMAL HIGH (ref 70–99)
Potassium: 4.1 mmol/L (ref 3.5–5.1)
Sodium: 134 mmol/L — ABNORMAL LOW (ref 135–145)

## 2023-02-03 NOTE — Plan of Care (Signed)

## 2023-02-03 NOTE — Progress Notes (Signed)
Patient slept all night on home vent. Took patient off this morning and placed back on 28% trach collar.

## 2023-02-03 NOTE — Progress Notes (Signed)
Progress Note    Gary [name]  WGN:[id number] DOB: 08/05/1955  DOA: 11/28/2022 PCP: Shayne Alken, MD      Brief Narrative:    Medical records reviewed and are as summarized below:   Gary [name] is a 67 y.o. 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   Gary [name] stated that he ran out of his home torsemide several days ago due to a mixup with his pharmacy.  Then over the last couple days, he has noticed increasing lower extremity swelling, shortness of breath and chest pain.  He states that he usually develops chest pain when he has "fluid on his lungs."    Patient was initially diuresed with IV diuresis, responded well.  Significant improvement in peripheral edema.  IV Lasix was switched with home torsemide once creatinine started increasing.  Patient remained stable from respiratory standpoint point and remained on 6 to 8 L of supplemental oxygen via trach collar which is his baseline.  Patient developed Pseudomonas tracheitis, which was treated with Levaquin as he developed some pain around his tracheostomy.  Patient has end-stage COPD and need to continue follow-up with pulmonary as outpatient  9/25-10/7.  Placement pending. Patient needs a lot of assistance to get out of the bed. Appealing LTACH denial   10/07: ophthalmology saw him today and Dr. 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  Patient more short of breath today.  States the Lasix has not helped him.  Will give 100 mg of torsemide now and switch back to 40 mg torsemide twice daily.  Chest x-ray consistent with cardiomegaly and pulmonary vascular congestion with bibasilar opacities which may represent atelectasis, aspiration and/or pneumonia. 10/10.  Since patient's  white blood cell count was normal and procalcitonin was negative, will hold off on antibiotics since lungs improved with torsemide dosing yesterday. 10/11.  Patient feels that his swelling is a little bit better breathing is a little bit better.  Will start Lexapro for anxiety.  Lac-Hydrin lotion for dryness on his feet. 10/15.  Creatinine stable at 1.48 on torsemide 40 mg twice daily.  Patient down to 6 L trach collar during the day.  Tolerating Lexapro.           Assessment/Plan:   Principal Problem:   Acute on chronic diastolic CHF (congestive heart failure) ([name]) Active Problems:   Acute on chronic respiratory failure with hypoxia and hypercapnia ([name])   COPD (chronic obstructive pulmonary disease) ([name])   Acute kidney injury superimposed on CKD ([name])   OSA (obstructive sleep apnea)   Anxiety   Hypotension   Chest pain   Morbid obesity with BMI of 50.0-59.9, adult ([name])   Generalized weakness   Chronic colitis   Fluid overload   Eyelid cyst, right   Dry skin    Body mass index is 51.83 kg/m. (Morbid obesity):     Acute on chronic diastolic CHF (congestive heart failure) ([name]) Continue spironolactone and torsemide.  Creatinine is stable   Acute on chronic respiratory failure with hypoxia and hypercapnia ([name]) He is tolerating 6 L/min oxygen via trach collar and this is his baseline.  He uses home ventilator at night  Pseudomonas tracheitis, hemoptysis Resolved.  Completed antibiotics including Levaquin and Bactrim    COPD (chronic obstructive pulmonary  disease) ([name]) Severe COPD on Combivent, as needed albuterol.  On yuperli nebulizer.   Acute kidney injury superimposed on CKD ([name]) Acute kidney injury on CKD stage IIIa.  Creatinine is stable.     Anxiety On Atarax as needed.  Continue Lexapro   OSA (obstructive sleep apnea) Continue home ventilator at night   Chest pain Resolved.  ACS ruled out.   Hypotension Resolved.  Off midodrine   Dry  skin Lac-Hydrin lotion to feet.   Eyelid cyst, right Lanced by ophthalmology on 01/22/2023.   Chronic colitis Continue home mesalamine   Generalized weakness Continue PT as able    Diet Order             Diet 2 gram sodium Fluid consistency: Thin; Fluid restriction: 1500 mL Fluid  Diet effective now                            Consultants: Ophthalmologist Otolaryngologist Pulmonologist  Procedures: Expression of fluid from hydrocystoma of right eyelid on 01/22/2023    Medications:    acidophilus  2 capsule Oral BID   amphetamine-dextroamphetamine  30 mg Oral Q breakfast   arformoterol  15 mcg Nebulization BID   bacitracin   Topical Daily   docusate sodium  100 mg Oral Daily   enoxaparin (LOVENOX) injection  100 mg Subcutaneous Q24H   escitalopram  10 mg Oral Daily   ferrous sulfate  325 mg Oral Q breakfast   Gerhardt's butt cream   Topical Daily   guaiFENesin  1,200 mg Oral BID   Ipratropium-Albuterol  1 puff Inhalation Q6H   Mesalamine  800 mg Oral BID   revefenacin  175 mcg Nebulization Daily   spironolactone  50 mg Oral Daily   torsemide  40 mg Oral BID   Continuous Infusions:   Anti-infectives (From admission, onward)    Start     Dose/Rate Route Frequency Ordered Stop   01/02/23 0900  sulfamethoxazole-trimethoprim (BACTRIM) 400-80 MG per tablet 1 tablet  Status:  Discontinued        1 tablet Oral Once per day on Monday Wednesday Friday 01/01/23 1207 01/12/23 1002   12/03/22 1615  levofloxacin (LEVAQUIN) tablet 750 mg        750 mg Oral Daily 12/03/22 1520 12/07/22 0831              Family Communication/Anticipated D/C date and plan/Code Status   DVT prophylaxis:      Code Status: Full Code  Family Communication: None Disposition Plan: Plan to discharge home versus SNF   Status is: Inpatient Remains inpatient appropriate because: Awaiting placement       Subjective:   Interval events noted.  No complaints.  He  feels good.  No shortness of breath or chest pain  Objective:    Vitals:   02/03/23 0104 02/03/23 0735 02/03/23 0813 02/03/23 1415  BP: 119/84  97/76 129/80  Pulse: 78  79 81  Resp: 20  18 16   Temp: 98.2 F (36.8 C)  98.2 F (36.8 C) 98.4 F (36.9 C)  TempSrc: Oral  Oral Oral  SpO2: 96% 92% 93% 96%  Weight:      Height:       No data found.   Intake/Output Summary (Last 24 hours) at 02/03/2023 1432 Last data filed at 02/03/2023 1100 Gross per 24 hour  Intake 720 ml  Output 1900 ml  Net -1180 ml   Filed Weights   01/23/23  1304  Weight: (!) 193.1 kg    Exam:  GEN: NAD SKIN: Warm and dry EYES: No pallor or icterus ENT: MMM, + trach CV: RRR PULM: CTA B ABD: soft, obese, NT, +BS CNS: AAO x 3, non focal EXT: Bilateral leg edema is improving, no tenderness    Data Reviewed:   I have personally reviewed following labs and imaging studies:  Labs: Labs show the following:   Basic Metabolic Panel: Recent Labs  Lab 01/29/23 0618 02/03/23 0900  NA 139 134*  K 4.2 4.1  CL 96* 93*  CO2 34* 31  GLUCOSE 87 131*  BUN 38* 34*  CREATININE 1.48* 1.52*  CALCIUM 8.4* 8.0*   GFR Estimated Creatinine Clearance: 88.6 mL/min (A) (by C-G formula based on SCr of 1.52 mg/dL (H)). Liver Function Tests: No results for input(s): "AST", "ALT", "ALKPHOS", "BILITOT", "PROT", "ALBUMIN" in the last 168 hours. No results for input(s): "LIPASE", "AMYLASE" in the last 168 hours. No results for input(s): "AMMONIA" in the last 168 hours. Coagulation profile No results for input(s): "INR", "PROTIME" in the last 168 hours.  CBC: No results for input(s): "WBC", "NEUTROABS", "HGB", "HCT", "MCV", "PLT" in the last 168 hours.  Cardiac Enzymes: No results for input(s): "CKTOTAL", "CKMB", "CKMBINDEX", "TROPONINI" in the last 168 hours. BNP (last 3 results) No results for input(s): "PROBNP" in the last 8760 hours. CBG: No results for input(s): "GLUCAP" in the last 168  hours. D-Dimer: No results for input(s): "DDIMER" in the last 72 hours. Hgb A1c: No results for input(s): "HGBA1C" in the last 72 hours. Lipid Profile: No results for input(s): "CHOL", "HDL", "LDLCALC", "TRIG", "CHOLHDL", "LDLDIRECT" in the last 72 hours. Thyroid function studies: No results for input(s): "TSH", "T4TOTAL", "T3FREE", "THYROIDAB" in the last 72 hours.  Invalid input(s): "FREET3" Anemia work up: No results for input(s): "VITAMINB12", "FOLATE", "FERRITIN", "TIBC", "IRON", "RETICCTPCT" in the last 72 hours. Sepsis Labs: No results for input(s): "PROCALCITON", "WBC", "LATICACIDVEN" in the last 168 hours.   Microbiology No results found for this or any previous visit (from the past 240 hour(s)).  Procedures and diagnostic studies:  No results found.             LOS: 65 days   Allix Blomquist  [employer] loss adjuster on [email or web address]. If 7PM-7AM, please contact night-coverage at [email or web address]     02/03/2023, 2:32 PM

## 2023-02-04 DIAGNOSIS — I5033 Acute on chronic diastolic (congestive) heart failure: Secondary | ICD-10-CM | POA: Diagnosis not present

## 2023-02-04 NOTE — Plan of Care (Signed)
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
## 2023-02-04 NOTE — Progress Notes (Signed)
[facility]  - [place] at [address] [facility]   PATIENT NAME: Gary [name]    MR#:  [id number]  DATE OF BIRTH:  June 10, 1955  SUBJECTIVE:  patient seen earlier. weaned down oxygen 6 L trach collar.   VITALS:  Blood pressure 119/83, pulse 84, temperature 98.2 F (36.8 C), resp. rate 18, height 6\' 4"  (1.93 m), weight (!) 193.1 kg, SpO2 91%.  PHYSICAL EXAMINATION:   GENERAL:  67 y.o.-year-old patient with no acute distress. Morbid obesity LUNGS: distant breath sounds bilaterally, no wheezing CARDIOVASCULAR: S1, S2 normal. No murmur   ABDOMEN: Soft, nontender, nondistended. Bowel sounds present.  EXTREMITIES: chronic trace edema b/l.    NEUROLOGIC: nonfocal  patient is alert and awake   LABORATORY PANEL:     Chemistries  Recent Labs  Lab 02/03/23 0900  NA 134*  K 4.1  CL 93*  CO2 31  GLUCOSE 131*  BUN 34*  CREATININE 1.52*  CALCIUM 8.0*   Assessment and Plan  Acute on chronic diastolic CHF (congestive heart failure) ([name]) Continue torsemide 40 mg twice daily.  Continue spironolactone.  Creatinine stable at 1.48.   Acute on chronic respiratory failure with hypoxia and hypercapnia ([name]) Continue trach collar 6-7 L.  This morning was on 6 L.  Continue ventilator at night.  Patient will need prolonged weaning secondary to severe COPD with hypercapnic hypoxic respiratory failure and sleep apnea.   COPD (chronic obstructive pulmonary disease) ([name]) Severe COPD on Combivent, as needed albuterol.  On yuperli nebulizer.   Acute kidney injury superimposed on CKD ([name]) Acute kidney injury on CKD stage IIIa.  Creatinine 1.48 with diuresis.  (On 9/18 did have a creatinine of 1.17).  Creatinine will likely stay higher being on torsemide 40 mg twice a day.   Anxiety On Atarax as needed.  Continue Lexapro   OSA (obstructive sleep apnea) Continue home ventilator at night   Chest pain ACS ruled out.   Hypotension Off midodrine   Dry skin Lac-Hydrin lotion to  feet.   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   Chronic colitis Continue home mesalamine   Generalized weakness Continue working with physical therapy and mobility specialist to work on getting out of the bed and ambulating.  Continue also doing exercises in the bed.   Morbid obesity with BMI of 50.0-59.9, adult ([name]) BMI 51.83   Family communication : none Consults : pulmonary CODE STATUS: full DVT Prophylaxis : Lovenox Level of care: Med-Surg Status is: Inpatient Remains inpatient appropriate because: challenging placement!!    TOTAL TIME TAKING CARE OF THIS PATIENT: 25 minutes.  >50% time spent on counselling and coordination of care  Note: This dictation was prepared with Dragon dictation along with smaller phrase technology. Any transcriptional errors that result from this process are unintentional.  [name] M.D    [facility]   CC: Primary care physician; Shayne Alken, MD

## 2023-02-05 DIAGNOSIS — I5033 Acute on chronic diastolic (congestive) heart failure: Secondary | ICD-10-CM | POA: Diagnosis not present

## 2023-02-05 LAB — CBC
HCT: 42.6 % (ref 39.0–52.0)
Hemoglobin: 11.2 g/dL — ABNORMAL LOW (ref 13.0–17.0)
MCH: 21 pg — ABNORMAL LOW (ref 26.0–34.0)
MCHC: 26.3 g/dL — ABNORMAL LOW (ref 30.0–36.0)
MCV: 79.8 fL — ABNORMAL LOW (ref 80.0–100.0)
Platelets: 244 10*3/uL (ref 150–400)
RBC: 5.34 MIL/uL (ref 4.22–5.81)
RDW: 19.6 % — ABNORMAL HIGH (ref 11.5–15.5)
WBC: 7.1 10*3/uL (ref 4.0–10.5)
nRBC: 0 % (ref 0.0–0.2)

## 2023-02-05 MED ORDER — BISACODYL 5 MG PO TBEC
10.0000 mg | DELAYED_RELEASE_TABLET | Freq: Every day | ORAL | Status: DC
  Administered 2023-02-05 – 2023-04-09 (×46): 10 mg via ORAL
  Filled 2023-02-05 (×62): qty 2

## 2023-02-05 MED ORDER — POLYETHYLENE GLYCOL 3350 17 G PO PACK
17.0000 g | PACK | Freq: Two times a day (BID) | ORAL | Status: DC
  Administered 2023-02-05 – 2023-04-07 (×42): 17 g via ORAL
  Filled 2023-02-05 (×87): qty 1

## 2023-02-05 NOTE — Progress Notes (Signed)
[facility]  - [place] at [facility]   PATIENT NAME: Gary [name]    MR#:  [id number]  DATE OF BIRTH:  1955-12-31  SUBJECTIVE:  patient seen earlier. weaned down oxygen 6 L trach collar. Refused PT today Trying to have BM  VITALS:  Blood pressure 118/78, pulse 80, temperature 98.3 F (36.8 C), temperature source Oral, resp. rate 14, height 6\' 4"  (1.93 m), weight (!) 193.1 kg, SpO2 92%.  PHYSICAL EXAMINATION:   GENERAL:  67 y.o.-year-old patient with no acute distress. Morbid obesity LUNGS: distant breath sounds bilaterally, no wheezing CARDIOVASCULAR: S1, S2 normal. No murmur   ABDOMEN: Soft, nontender, nondistended. Bowel sounds present.  EXTREMITIES: chronic trace edema b/l.    NEUROLOGIC: nonfocal  patient is alert and awake  LABORATORY PANEL:     Chemistries  Recent Labs  Lab 02/03/23 0900  NA 134*  K 4.1  CL 93*  CO2 31  GLUCOSE 131*  BUN 34*  CREATININE 1.52*  CALCIUM 8.0*   Assessment and Plan  Acute on chronic diastolic CHF (congestive heart failure) ([name]) Continue torsemide 40 mg twice daily.  Continue spironolactone.  Creatinine stable at 1.48.   Acute on chronic respiratory failure with hypoxia and hypercapnia ([name]) Continue trach collar 6-7 L.  This morning was on 6 L.  Continue ventilator at night.  Patient will need prolonged weaning secondary to severe COPD with hypercapnic hypoxic respiratory failure and sleep apnea.   COPD (chronic obstructive pulmonary disease) ([name]) Severe COPD on Combivent, as needed albuterol.  On yuperli nebulizer.   Acute kidney injury superimposed on CKD ([name]) Acute kidney injury on CKD stage IIIa.  Creatinine 1.48 with diuresis.  (On 9/18 did have a creatinine of 1.17).  Creatinine will likely stay higher being on torsemide 40 mg twice a day.   Anxiety On Atarax as needed.  Continue Lexapro   OSA (obstructive sleep apnea) Continue home ventilator at night   Chest pain ACS ruled out.    Hypotension Off midodrine   Dry skin Lac-Hydrin lotion to feet.   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   Chronic colitis Continue home mesalamine   Generalized weakness Continue working with physical therapy and mobility specialist to work on getting out of the bed and ambulating.  Continue also doing exercises in the bed.   Morbid obesity with BMI of 50.0-59.9, adult ([name]) BMI 51.83   Family communication : none Consults : pulmonary CODE STATUS: full DVT Prophylaxis : Lovenox Level of care: Med-Surg Status is: Inpatient Remains inpatient appropriate because: challenging placement!!    TOTAL TIME TAKING CARE OF THIS PATIENT: 25 minutes.  >50% time spent on counselling and coordination of care  Note: This dictation was prepared with Dragon dictation along with smaller phrase technology. Any transcriptional errors that result from this process are unintentional.  [name] M.D    [facility]   CC: Primary care physician; Shayne Alken, MD

## 2023-02-05 NOTE — Plan of Care (Signed)

## 2023-02-05 NOTE — Progress Notes (Signed)
OT Cancellation Note  Patient Details Name: Gary [name] MRN: [id number] DOB: Aug 12, 1955   Cancelled Treatment:    Reason Eval/Treat Not Completed: Patient declined, no reason specified. Pt reports taking laxatives and need for BM prior to mobility. Declines BSC. Will return as able.   Kathie Dike, M.S. OTR/L  02/05/23, 1:24 PM  ascom [phone]

## 2023-02-05 NOTE — Progress Notes (Signed)
PT Cancellation Note  Patient Details Name: DOLPH SOTTO MRN: [id number] DOB: Aug 19, 1955   Cancelled Treatment:    Reason Eval/Treat Not Completed: Other (comment).   Patient declined. Pt reports taking laxatives and need for BM prior to mobility. Declines BSC. Will return as able.     Nolon Bussing, PT, DPT Physical Therapist - [facility]  02/05/23, 1:57 PM

## 2023-02-06 DIAGNOSIS — I5033 Acute on chronic diastolic (congestive) heart failure: Secondary | ICD-10-CM | POA: Diagnosis not present

## 2023-02-06 NOTE — Progress Notes (Signed)
Occupational Therapy Treatment Patient Details Name: Gary [name] MRN: [id number] DOB: [birth year] Today's Date: 02/06/2023   History of present illness 67 y/o male presented to [facility] ED on 11/28/22 for chest pain x 4 days. Admitted for acute on chronic CHF. Frequent admissions this year with most recent dsicharge on 11/21/22. PMH includes obesity, hypoxia, respiratory failure with tracheostomy, COPD, GIB, HFpEF.   OT comments  Mr Fetsko was seen for OT treatment on this date. Upon arrival to room pt reclined in bed, agreeable to tx. Pt requires no physical assist for mobility however requests bed height max elevated >30" stating "I dont want to waste my energy on standing." Tolerated ~55 ft mobility with SUPERVISION + RW. Agreeable to sessions next date. Pt making progress toward goals, will continue to follow POC. Discharge recommendation remains appropriate.       If plan is discharge home, recommend the following:  A little help with walking and/or transfers;A lot of help with bathing/dressing/bathroom   Equipment Recommendations  BSC/3in1    Recommendations for Other Services      Precautions / Restrictions Precautions Precautions: Fall Precaution Comments: trach collar/ Trach (6 L) Restrictions Weight Bearing Restrictions: No RLE Weight Bearing: Weight bearing as tolerated       Mobility Bed Mobility Overal bed mobility: Needs Assistance Bed Mobility: Supine to Sit     Supine to sit: Supervision, Used rails          Transfers Overall transfer level: Needs assistance Equipment used: Rolling walker (2 wheels) Transfers: Sit to/from Stand Sit to Stand: From elevated surface, Supervision           General transfer comment: pt still remains unwilling to stand from non elevated (extremely elevated..." I dont want to be too tired before I walk.")     Balance Overall balance assessment: Needs assistance Sitting-balance support: Feet supported, No upper extremity  supported Sitting balance-[name] Scale: Good     Standing balance support: Bilateral upper extremity supported Standing balance-[name] Scale: Fair                             ADL either performed or assessed with clinical judgement   ADL Overall ADL's : Needs assistance/impaired                                       General ADL Comments: MAX A don B shoes in sitting      Cognition Arousal: Alert Behavior During Therapy: WFL for tasks assessed/performed Overall Cognitive Status: Within Functional Limits for tasks assessed                                                General Comments pt agreeable to 2 sessions tmrw      Pertinent Vitals/ Pain       Pain Assessment Pain Assessment: No/denies pain   Frequency  Min 1X/week        Progress Toward Goals  OT Goals(current goals can now be found in the care plan section)  Progress towards OT goals: Progressing toward goals  Acute Rehab OT Goals Patient Stated Goal: to go home OT Goal Formulation: With patient Time For Goal Achievement: 02/11/23 Potential to Achieve Goals: Good ADL Goals  Pt Will Perform Grooming: with modified independence;standing Pt Will Transfer to Toilet: with modified independence;ambulating;regular height toilet Pt Will Perform Toileting - Clothing Manipulation and hygiene: with mod assist;sit to/from stand  Plan Discharge plan remains appropriate;Frequency remains appropriate    Co-evaluation        PT goals addressed during session: Mobility/safety with mobility;Balance;Proper use of DME;Strengthening/ROM        AM-PAC OT "6 Clicks" Daily Activity     Outcome Measure   Help from another person eating meals?: None Help from another person taking care of personal grooming?: A Little Help from another person toileting, which includes using toliet, bedpan, or urinal?: A Lot Help from another person bathing (including washing, rinsing,  drying)?: A Lot Help from another person to put on and taking off regular upper body clothing?: None Help from another person to put on and taking off regular lower body clothing?: A Lot 6 Click Score: 17    End of Session    OT Visit Diagnosis: Unsteadiness on feet (R26.81);Other abnormalities of gait and mobility (R26.89);Muscle weakness (generalized) (M62.81);Adult, failure to thrive (R62.7)   Activity Tolerance Patient tolerated treatment well   Patient Left in chair;with call bell/phone within reach   Nurse Communication          Time: 1610-9604 OT Time Calculation (min): 21 min  Charges: OT General Charges $OT Visit: 1 Visit OT Treatments $Therapeutic Activity: 8-22 mins  Kathie Dike, M.S. OTR/L  02/06/23, 1:31 PM  ascom [phone]
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
## 2023-02-06 NOTE — Progress Notes (Signed)
Physical Therapy Treatment Patient Details Name: Gary [name] MRN: [id number] DOB: 03-Sep-1955 Today's Date: 02/06/2023   History of Present Illness 67 y/o male presented to [facility] ED on 11/28/22 for chest pain x 4 days. Admitted for acute on chronic CHF. Frequent admissions this year with most recent dsicharge on 11/21/22. PMH includes obesity, hypoxia, respiratory failure with tracheostomy, COPD, GIB, HFpEF.    PT Comments  Pt was A and agreeable to PT session however continues to like to dictate session progression. He was willing to attempt 2 separate sessions this date. Pt unwilling to stand from lower bed heights but did ambulate short distance into hallway. Educated on need to change up activity levels if he plans to get stronger/ return home. He states understanding and willingness to work with either OT/ mobility techs later this date. Acute PT will continue to encourage OOB activity to promote strengthening while decreasing caregiver burden.   If plan is discharge home, recommend the following: A lot of help with bathing/dressing/bathroom;Assist for transportation;A lot of help with walking and/or transfers;A little help with walking and/or transfers     Equipment Recommendations  Other (comment) (Defer to next level of care)       Precautions / Restrictions Precautions Precautions: Fall Precaution Comments: trach collar/ Trach (5 L) Restrictions Weight Bearing Restrictions: No RLE Weight Bearing: Weight bearing as tolerated     Mobility  Bed Mobility Overal bed mobility: Needs Assistance Bed Mobility: Supine to Sit, Sit to Supine  Supine to sit: Used rails, HOB elevated, Min assist Sit to supine: Mod assist, Used rails, HOB elevated        Transfers Overall transfer level: Needs assistance Equipment used: Rolling walker (2 wheels) Transfers: Sit to/from Stand Sit to Stand: From elevated surface, Supervision  General transfer comment: pt still remains unwilling to stand  from non elevated (extremely elevated..." I dont want to be too tired before I walk.")  bed height    Ambulation/Gait Ambulation/Gait assistance: Contact guard assist Gait Distance (Feet): 50 Feet Assistive device: Rolling walker (2 wheels) Gait Pattern/deviations: Step-through pattern Gait velocity: decreased  General Gait Details: Pt is slightly self limiting and continues to like to dictate session progression. Pt unwilling to go further distances due to fatigue." I want to walk more this afternoon."   Balance Overall balance assessment: Needs assistance Sitting-balance support: Feet supported, No upper extremity supported Sitting balance-[name] Scale: Good     Standing balance support: Bilateral upper extremity supported Standing balance-[name] Scale: Fair Standing balance comment: Reliant on RW/AD for all standing activity.       Cognition Arousal: Alert Behavior During Therapy: WFL for tasks assessed/performed Overall Cognitive Status: Within Functional Limits for tasks assessed    General Comments: Pt is A and O x 4           General Comments General comments (skin integrity, edema, etc.): lengthy discussion about importance of increasing activity throughout the week. discussed not taking days off and need to increased OOB time. Pt was agreeable to perform 2 seperate sessions today. Will see if pt agrees to work with OT/mobility techs later this date      Pertinent Vitals/Pain Pain Assessment Pain Assessment: No/denies pain Pain Score: 0-No pain     PT Goals (current goals can now be found in the care plan section) Acute Rehab PT Goals Patient Stated Goal: Get better so I can get out of here." Progress towards PT goals: Not progressing toward goals - comment (self limiting/ likes to  dictate session. unwilling to attempt encouraged activities/exercises)    Frequency    Min 1X/week      PT Plan Current plan remains appropriate    Co-evaluation     PT  goals addressed during session: Mobility/safety with mobility;Balance;Proper use of DME;Strengthening/ROM        AM-PAC PT "6 Clicks" Mobility   Outcome Measure  Help needed turning from your back to your side while in a flat bed without using bedrails?: None Help needed moving from lying on your back to sitting on the side of a flat bed without using bedrails?: None Help needed moving to and from a bed to a chair (including a wheelchair)?: A Lot Help needed standing up from a chair using your arms (e.g., wheelchair or bedside chair)?: A Lot Help needed to walk in hospital room?: A Little Help needed climbing 3-5 steps with a railing? : Total 6 Click Score: 16    End of Session Equipment Utilized During Treatment: Oxygen (5 L trach collar when in room, 6 L trach collar during ambulation) Activity Tolerance: Patient tolerated treatment well Patient left: in bed;with call bell/phone within reach;with bed alarm set;with nursing/sitter in room Nurse Communication: Mobility status PT Visit Diagnosis: Muscle weakness (generalized) (M62.81);Difficulty in walking, not elsewhere classified (R26.2)     Time: 5409-8119 PT Time Calculation (min) (ACUTE ONLY): 15 min  Charges:    $Gait Training: 8-22 mins PT General Charges $$ ACUTE PT VISIT: 1 Visit                     Jetta Lout PTA 02/06/23, 12:23 PM

## 2023-02-06 NOTE — Progress Notes (Signed)
[facility]  - [place] [facility]   PATIENT NAME: Gary [name]    MR#:  [id number]  DATE OF BIRTH:  [birth year]  SUBJECTIVE:  patient seen earlier. weaned down oxygen 6 L trach collar. Worked with PT today Trying to have BM  VITALS:  Blood pressure 115/83, pulse 85, temperature 98.2 F (36.8 C), resp. rate 16, height 6\' 4"  (1.93 m), weight (!) 193.1 kg, SpO2 95%.  PHYSICAL EXAMINATION:   GENERAL:  67 y.o.-year-old patient with no acute distress. Morbid obesity LUNGS: distant breath sounds bilaterally, no wheezing CARDIOVASCULAR: S1, S2 normal. No murmur   ABDOMEN: Soft, nontender, nondistended.EXTREMITIES: chronic trace edema b/l.    NEUROLOGIC: nonfocal  patient is alert and awake  LABORATORY PANEL:     Chemistries  Recent Labs  Lab 02/03/23 0900  NA 134*  K 4.1  CL 93*  CO2 31  GLUCOSE 131*  BUN 34*  CREATININE 1.52*  CALCIUM 8.0*   Assessment and Plan  Acute on chronic diastolic CHF (congestive heart failure) ([name]) Continue torsemide 40 mg twice daily.  Continue spironolactone.  Creatinine stable at 1.48.   Acute on chronic respiratory failure with hypoxia and hypercapnia ([name]) Continue trach collar 6-7 L.  This morning was on 6 L.  Continue ventilator at night.  Patient will need prolonged weaning secondary to severe COPD with hypercapnic hypoxic respiratory failure and sleep apnea.   COPD (chronic obstructive pulmonary disease) ([name]) Severe COPD on Combivent, as needed albuterol.  On yuperli nebulizer.   Acute kidney injury superimposed on CKD ([name]) Acute kidney injury on CKD stage IIIa.  Creatinine 1.48 with diuresis.  (On 9/18 did have a creatinine of 1.17).  Creatinine will likely stay higher being on torsemide 40 mg twice a day.   Anxiety On Atarax as needed.  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   Chronic colitis Continue home  mesalamine   Generalized weakness Continue working with physical therapy and mobility specialist to work on getting out of the bed and ambulating.  Continue also doing exercises in the bed.   Morbid obesity with BMI of 50.0-59.9, adult ([name]) BMI 51.83   Family communication : none Consults : pulmonary CODE STATUS: full DVT Prophylaxis : Lovenox Level of care: Med-Surg Status is: Inpatient Remains inpatient appropriate because: challenging discharge!    TOTAL TIME TAKING CARE OF THIS PATIENT: 25 minutes.  >50% time spent on counselling and coordination of care  Note: This dictation was prepared with Dragon dictation along with smaller phrase technology. Any transcriptional errors that result from this process are unintentional.  [name] M.D    [facility]   CC: Primary care physician; Shayne Alken, MD

## 2023-02-07 DIAGNOSIS — I5033 Acute on chronic diastolic (congestive) heart failure: Secondary | ICD-10-CM | POA: Diagnosis not present

## 2023-02-07 NOTE — Progress Notes (Signed)
[facility]  - [place] at [facility]   PATIENT NAME: Gary [name]    MR#:  [id number]  DATE OF BIRTH:  1955-09-14  SUBJECTIVE:  patient seen earlier. weaned down oxygen 6 L trach collar. Pt has been refusing PT lately after several attempts.  Gets upset when I discuss about d/c plans  VITALS:  Blood pressure 119/85, pulse 79, temperature 98.3 F (36.8 C), resp. rate 14, height 6\' 4"  (1.93 m), weight (!) 193.1 kg, SpO2 93%.  PHYSICAL EXAMINATION:   GENERAL:  67 y.o.-year-old patient with no acute distress. Morbid obesity, Trach Collar+ LUNGS: distant breath sounds bilaterally, no wheezing CARDIOVASCULAR: S1, S2 normal. No murmur   ABDOMEN: Soft, nontender, nondistended.EXTREMITIES: chronic trace edema b/l.    NEUROLOGIC: nonfocal  patient is alert and awake  LABORATORY PANEL:     Chemistries  Recent Labs  Lab 02/03/23 0900  NA 134*  K 4.1  CL 93*  CO2 31  GLUCOSE 131*  BUN 34*  CREATININE 1.52*  CALCIUM 8.0*   Assessment and Plan  Acute on chronic diastolic CHF (congestive heart failure) ([name]) Continue torsemide , spironolactone.   Creatinine stable at 1.5.   Acute on chronic respiratory failure with hypoxia and hypercapnia ([name]) Continue trach collar 6-7 L.  Continue ventilator at night.  Patient will need prolonged weaning secondary to severe COPD with hypercapnic hypoxic respiratory failure and sleep apnea.   COPD (chronic obstructive pulmonary disease) ([name]) Severe COPD on Combivent, as needed albuterol.  On yuperli nebulizer.   Acute kidney injury superimposed on CKD ([name]) Acute kidney injury on CKD stage IIIa.  Creatinine 1.48 with diuresis.  (On 9/18 did have a creatinine of 1.17).  Creatinine will likely stay higher being on torsemide 40 mg twice a day.   Anxiety On Atarax as needed.  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   Chronic  colitis Continue home mesalamine   Generalized weakness Continue working with physical therapy and mobility specialist to work on getting out of the bed and ambulating.  Continue also doing exercises in the bed.   Morbid obesity with BMI of 50.0-59.9, adult ([name]) BMI 51.83   Family communication : none Consults : pulmonary CODE STATUS: full DVT Prophylaxis : Lovenox Level of care: Med-Surg Status is: Inpatient Remains inpatient appropriate because: challenging discharge!  Pt lately has been refusing to do PT consistently. Gets upset with Providers when discharge planning is discussed    TOTAL TIME TAKING CARE OF THIS PATIENT: 25 minutes.  >50% time spent on counselling and coordination of care  Note: This dictation was prepared with Dragon dictation along with smaller phrase technology. Any transcriptional errors that result from this process are unintentional.  [name] M.D    [facility]   CC: Primary care physician; Shayne Alken, MD

## 2023-02-07 NOTE — Progress Notes (Signed)
OT Cancellation Note  Patient Details Name: Gary [name] MRN: [id number] DOB: 1955/08/16   Cancelled Treatment:    Reason Eval/Treat Not Completed: Patient declined, no reason specified. Initial attempt pt on bed pan, 2nd attempt pt states not feeling up to it, agreeable to try next date.   Kathie Dike, M.S. OTR/L  02/07/23, 2:01 PM  ascom [phone]

## 2023-02-07 NOTE — Progress Notes (Signed)
PT Cancellation Note  Patient Details Name: Gary [name] MRN: [id number] DOB: 1955-06-29   Cancelled Treatment:     PT attempt x 3 this morning. Pt unwilling due too," My stomach is hurting." Acute PT will continue to follow and progress per current POC    [name] 02/07/2023, 10:22 AM
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
## 2023-02-07 NOTE — Plan of Care (Signed)

## 2023-02-08 ENCOUNTER — Inpatient Hospital Stay: Payer: Medicare HMO

## 2023-02-08 DIAGNOSIS — I5033 Acute on chronic diastolic (congestive) heart failure: Secondary | ICD-10-CM | POA: Diagnosis not present

## 2023-02-08 NOTE — Plan of Care (Signed)

## 2023-02-08 NOTE — Progress Notes (Signed)
Progress Note    CARION [name]  FAO:[id number] DOB: 11-Jan-1956  DOA: 11/28/2022 PCP: Shayne Alken, MD      Brief Narrative:    Medical records reviewed and are as summarized below:   Gary [name] is a 67 y.o. 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   [name] stated that he ran out of his home torsemide several days ago due to a mixup with his pharmacy.  Then over the last couple days, he has noticed increasing lower extremity swelling, shortness of breath and chest pain.  He states that he usually develops chest pain when he has "fluid on his lungs."    Patient was initially diuresed with IV diuresis, responded well.  Significant improvement in peripheral edema.  IV Lasix was switched with home torsemide once creatinine started increasing.  Patient remained stable from respiratory standpoint point and remained on 6 to 8 L of supplemental oxygen via trach collar which is his baseline.  Patient developed Pseudomonas tracheitis, which was treated with Levaquin as he developed some pain around his tracheostomy.  Patient has end-stage COPD and need to continue follow-up with pulmonary as outpatient  9/25-10/7.  Placement pending. Patient needs a lot of assistance to get out of the bed. Appealing LTACH denial   10/07: ophthalmology saw him today and Dr. 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  Patient more short of breath today.  States the Lasix has not helped him.  Will give 100 mg of torsemide now and switch back to 40 mg torsemide twice daily.  Chest x-ray consistent with cardiomegaly and pulmonary vascular congestion with bibasilar opacities which may represent atelectasis, aspiration and/or pneumonia. 10/10.  Since patient's  white blood cell count was normal and procalcitonin was negative, will hold off on antibiotics since lungs improved with torsemide dosing yesterday. 10/11.  Patient feels that his swelling is a little bit better breathing is a little bit better.  Will start Lexapro for anxiety.  Lac-Hydrin lotion for dryness on his feet. 10/15.  Creatinine stable at 1.48 on torsemide 40 mg twice daily.  Patient down to 6 L trach collar during the day.  Tolerating Lexapro.           Assessment/Plan:   Principal Problem:   Acute on chronic diastolic CHF (congestive heart failure) ([name]) Active Problems:   Acute on chronic respiratory failure with hypoxia and hypercapnia ([name])   COPD (chronic obstructive pulmonary disease) ([name])   Acute kidney injury superimposed on CKD ([name])   OSA (obstructive sleep apnea)   Anxiety   Hypotension   Chest pain   Morbid obesity with BMI of 50.0-59.9, adult ([name])   Generalized weakness   Chronic colitis   Fluid overload   Eyelid cyst, right   Dry skin    Body mass index is 51.83 kg/m. (Morbid obesity):    Acute on chronic diastolic CHF (congestive heart failure) ([name]) Peripheral edema has improved.  Continue torsemide and spironolactone.   Acute on chronic respiratory failure with hypoxia and hypercapnia ([name]), tracheostomy dependent Suspected tracheostomy malfunction: Notified Dr. Karna Christmas, pulmonologist, about suspected tracheostomy malfunction.  CT soft tissue of the neck has been ordered for further evaluation.  He is tolerating 6 L/min oxygen via trach collar and this is his  baseline.  He uses home ventilator at night  Pseudomonas tracheitis, hemoptysis Resolved.  Completed antibiotics including Levaquin and Bactrim    COPD (chronic obstructive pulmonary disease) ([name]) Severe COPD on Combivent, as needed albuterol.  On yuperli nebulizer.   Acute kidney injury superimposed on CKD ([name]) Acute kidney injury on CKD stage IIIa.  Creatinine is stable.      Anxiety On Atarax as needed.  Continue Lexapro   OSA (obstructive sleep apnea) Continue home ventilator at night   Chest pain Resolved.  ACS ruled out.   Hypotension Resolved.  Off midodrine   Dry skin Lac-Hydrin lotion to feet.   Eyelid cyst, right Lanced by ophthalmology on 01/22/2023.   Chronic colitis Continue home mesalamine   Generalized weakness Continue PT as able    Diet Order             Diet 2 gram sodium Fluid consistency: Thin; Fluid restriction: 1500 mL Fluid  Diet effective now                            Consultants: Ophthalmologist Otolaryngologist Pulmonologist  Procedures: Expression of fluid from hydrocystoma of right eyelid on 01/22/2023    Medications:    acidophilus  2 capsule Oral BID   amphetamine-dextroamphetamine  30 mg Oral Q breakfast   arformoterol  15 mcg Nebulization BID   bacitracin   Topical Daily   bisacodyl  10 mg Oral Daily   docusate sodium  100 mg Oral Daily   enoxaparin (LOVENOX) injection  100 mg Subcutaneous Q24H   escitalopram  10 mg Oral Daily   ferrous sulfate  325 mg Oral Q breakfast   Gerhardt's butt cream   Topical Daily   guaiFENesin  1,200 mg Oral BID   Ipratropium-Albuterol  1 puff Inhalation Q6H   Mesalamine  800 mg Oral BID   polyethylene glycol  17 g Oral BID   revefenacin  175 mcg Nebulization Daily   spironolactone  50 mg Oral Daily   torsemide  40 mg Oral BID   Continuous Infusions:   Anti-infectives (From admission, onward)    Start     Dose/Rate Route Frequency Ordered Stop   01/02/23 0900  sulfamethoxazole-trimethoprim (BACTRIM) 400-80 MG per tablet 1 tablet  Status:  Discontinued        1 tablet Oral Once per day on Monday Wednesday Friday 01/01/23 1207 01/12/23 1002   12/03/22 1615  levofloxacin (LEVAQUIN) tablet 750 mg        750 mg Oral Daily 12/03/22 1520 12/07/22 0831              Family Communication/Anticipated D/C date and plan/Code Status   DVT  prophylaxis:      Code Status: Full Code  Family Communication: None Disposition Plan: Plan to discharge home versus SNF   Status is: Inpatient Remains inpatient appropriate because: Awaiting placement       Subjective:   Interval events noted.  He said he has trouble inserting inner cannula in his tracheostomy.  He felt some resistance and discomfort in the trach.  No cough, shortness of breath, chest pain.   Objective:    Vitals:   02/08/23 0817 02/08/23 0834 02/08/23 1116 02/08/23 1620  BP:  125/75  124/83  Pulse:  96 91 86  Resp: 18 16 16 16   Temp:  97.9 F (36.6 C)  98.2 F (36.8 C)  TempSrc:  Oral  Oral  SpO2: 91% 98% 93%  95%  Weight:      Height:       No data found.   Intake/Output Summary (Last 24 hours) at 02/08/2023 1711 Last data filed at 02/08/2023 1622 Gross per 24 hour  Intake --  Output 2150 ml  Net -2150 ml   Filed Weights   01/23/23 1304  Weight: (!) 193.1 kg    Exam:  GEN: NAD SKIN: Warm and dry EYES: No pallor or icterus ENT: MMM, + trach CV: RRR PULM: CTA B ABD: soft, obese, NT, +BS CNS: AAO x 3, non focal EXT: No edema or tenderness   Data Reviewed:   I have personally reviewed following labs and imaging studies:  Labs: Labs show the following:   Basic Metabolic Panel: Recent Labs  Lab 02/03/23 0900  NA 134*  K 4.1  CL 93*  CO2 31  GLUCOSE 131*  BUN 34*  CREATININE 1.52*  CALCIUM 8.0*   GFR Estimated Creatinine Clearance: 88.6 mL/min (A) (by C-G formula based on SCr of 1.52 mg/dL (H)). Liver Function Tests: No results for input(s): "AST", "ALT", "ALKPHOS", "BILITOT", "PROT", "ALBUMIN" in the last 168 hours. No results for input(s): "LIPASE", "AMYLASE" in the last 168 hours. No results for input(s): "AMMONIA" in the last 168 hours. Coagulation profile No results for input(s): "INR", "PROTIME" in the last 168 hours.  CBC: Recent Labs  Lab 02/05/23 0509  WBC 7.1  HGB 11.2*  HCT 42.6  MCV 79.8*   PLT 244    Cardiac Enzymes: No results for input(s): "CKTOTAL", "CKMB", "CKMBINDEX", "TROPONINI" in the last 168 hours. BNP (last 3 results) No results for input(s): "PROBNP" in the last 8760 hours. CBG: No results for input(s): "GLUCAP" in the last 168 hours. D-Dimer: No results for input(s): "DDIMER" in the last 72 hours. Hgb A1c: No results for input(s): "HGBA1C" in the last 72 hours. Lipid Profile: No results for input(s): "CHOL", "HDL", "LDLCALC", "TRIG", "CHOLHDL", "LDLDIRECT" in the last 72 hours. Thyroid function studies: No results for input(s): "TSH", "T4TOTAL", "T3FREE", "THYROIDAB" in the last 72 hours.  Invalid input(s): "FREET3" Anemia work up: No results for input(s): "VITAMINB12", "FOLATE", "FERRITIN", "TIBC", "IRON", "RETICCTPCT" in the last 72 hours. Sepsis Labs: Recent Labs  Lab 02/05/23 0509  WBC 7.1     Microbiology No results found for this or any previous visit (from the past 240 hour(s)).  Procedures and diagnostic studies:  No results found.             LOS: 70 days   Aldyn Toon  [employer] loss adjuster on [email or web address]. If 7PM-7AM, please contact night-coverage at [email or web address]     02/08/2023, 5:11 PM

## 2023-02-08 NOTE — Progress Notes (Signed)
PT Cancellation Note  Patient Details Name: Gary [name] MRN: [id number] DOB: [birth year]   Cancelled Treatment:     PT attempt. Pt requested to hold off on PT until after CT of neck performed. PT will return later this date and continue to follow per current POC.    [name] 02/08/2023, 1:09 PM

## 2023-02-08 NOTE — Progress Notes (Signed)
OT Cancellation Note  Patient Details Name: Gary [name] MRN: [id number] DOB: 1955/09/07   Cancelled Treatment:    Reason Eval/Treat Not Completed: Other (comment). Pt defers session until after CT of neck performed. Will return later as able and continue to follow per current POC.   Kathie Dike, M.S. OTR/L  02/08/23, 2:11 PM  ascom [phone]  [name] 02/08/2023, 2:10 PM
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
## 2023-02-08 NOTE — Progress Notes (Signed)
Patient complaining about resistance noted while trying to place inner cannula. I communicated with him earlier that his tracheostomy is not completely in place. Patient would not let me advance tracheostomy. When placing patient back on trach collar, I decided to change his inner cannula and had difficulty placing the cannula. I explained to patient that this will become a bigger problem as long as he allow tracheostomy to be out of place

## 2023-02-09 DIAGNOSIS — I5033 Acute on chronic diastolic (congestive) heart failure: Secondary | ICD-10-CM | POA: Diagnosis not present

## 2023-02-09 NOTE — Progress Notes (Addendum)
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 Sent chat to MD to reorder for Monday but also to consider direct visualization via endoscopy if appropriate, or barium swallow, as Mod Ba swallow may not pick up on fistula depending on how far down it is. Viewing of the esophagus will also likely be limited because of Pt's size. Also discussed with Nsg. Will F/U tomorrow to determine next plan of care.   Eather Colas 02/09/2023, 11:46 AM

## 2023-02-09 NOTE — Progress Notes (Signed)
Progress Note    Gary [name]  NUU:[id number] DOB: [birth year]  DOA: 11/28/2022 PCP: Shayne Alken, MD      Brief Narrative:    Medical records reviewed and are as summarized below:   Gary [name] is a 67 y.o. 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   Gary [name] stated that he ran out of his home torsemide several days ago due to a mixup with his pharmacy.  Then over the last couple days, he has noticed increasing lower extremity swelling, shortness of breath and chest pain.  He states that he usually develops chest pain when he has "fluid on his lungs."    Patient was initially diuresed with IV diuresis, responded well.  Significant improvement in peripheral edema.  IV Lasix was switched with home torsemide once creatinine started increasing.  Patient remained stable from respiratory standpoint point and remained on 6 to 8 L of supplemental oxygen via trach collar which is his baseline.  Patient developed Pseudomonas tracheitis, which was treated with Levaquin as he developed some pain around his tracheostomy.  Patient has end-stage COPD and need to continue follow-up with pulmonary as outpatient  9/25-10/7.  Placement pending. Patient needs a lot of assistance to get out of the bed. Appealing LTACH denial   10/07: ophthalmology saw him today and Dr. 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  Patient more short of breath today.  States the Lasix has not helped him.  Will give 100 mg of torsemide now and switch back to 40 mg torsemide twice daily.  Chest x-ray consistent with cardiomegaly and pulmonary vascular congestion with bibasilar opacities which may represent atelectasis, aspiration and/or pneumonia. 10/10.  Since patient's  white blood cell count was normal and procalcitonin was negative, will hold off on antibiotics since lungs improved with torsemide dosing yesterday. 10/11.  Patient feels that his swelling is a little bit better breathing is a little bit better.  Will start Lexapro for anxiety.  Lac-Hydrin lotion for dryness on his feet. 10/15.  Creatinine stable at 1.48 on torsemide 40 mg twice daily.  Patient down to 6 L trach collar during the day.  Tolerating Lexapro.           Assessment/Plan:   Principal Problem:   Acute on chronic diastolic CHF (congestive heart failure) ([name]) Active Problems:   Acute on chronic respiratory failure with hypoxia and hypercapnia ([name])   COPD (chronic obstructive pulmonary disease) ([name])   Acute kidney injury superimposed on CKD ([name])   OSA (obstructive sleep apnea)   Anxiety   Hypotension   Chest pain   Morbid obesity with BMI of 50.0-59.9, adult ([name])   Generalized weakness   Chronic colitis   Fluid overload   Eyelid cyst, right   Dry skin    Body mass index is 51.83 kg/m. (Morbid obesity):    Acute on chronic diastolic CHF (congestive heart failure) ([name]) Peripheral edema has improved.  Continue torsemide and spironolactone.   Acute on chronic respiratory failure with hypoxia and hypercapnia ([name]), tracheostomy dependent Suspected tracheostomy malfunction: CT soft tissue of neck done on 02/08/2023 reviewed.  Tracheostomy tube is in place and there is no definite collection around the tube.  There are some thickening of the wall between the esophagus  and trachea with possible defect.  Barium swallow has been ordered for further evaluation.  Follow-up with pulmonologist.  He is tolerating 6 L/min oxygen via trach collar and this is his baseline.  He uses home ventilator at night  Pseudomonas tracheitis, hemoptysis Resolved.  Completed antibiotics including Levaquin and Bactrim    COPD (chronic obstructive pulmonary disease) ([name]) Severe COPD  on Combivent, as needed albuterol.  On yuperli nebulizer.   Acute kidney injury superimposed on CKD ([name]) Acute kidney injury on CKD stage IIIa.  Creatinine is stable.     Anxiety On Atarax as needed.  Continue Lexapro   OSA (obstructive sleep apnea) Continue home ventilator at night   Chest pain Resolved.  ACS ruled out.   Hypotension Resolved.  Off midodrine   Dry skin Lac-Hydrin lotion to feet.   Eyelid cyst, right Lanced by ophthalmology on 01/22/2023.   Chronic colitis Continue home mesalamine   Generalized weakness Continue PT as able    Diet Order             Diet 2 gram sodium Fluid consistency: Thin; Fluid restriction: 1500 mL Fluid  Diet effective now                            Consultants: Ophthalmologist Otolaryngologist Pulmonologist  Procedures: Expression of fluid from hydrocystoma of right eyelid on 01/22/2023    Medications:    acidophilus  2 capsule Oral BID   amphetamine-dextroamphetamine  30 mg Oral Q breakfast   arformoterol  15 mcg Nebulization BID   bacitracin   Topical Daily   bisacodyl  10 mg Oral Daily   docusate sodium  100 mg Oral Daily   enoxaparin (LOVENOX) injection  100 mg Subcutaneous Q24H   escitalopram  10 mg Oral Daily   ferrous sulfate  325 mg Oral Q breakfast   Gerhardt's butt cream   Topical Daily   guaiFENesin  1,200 mg Oral BID   Ipratropium-Albuterol  1 puff Inhalation Q6H   Mesalamine  800 mg Oral BID   polyethylene glycol  17 g Oral BID   revefenacin  175 mcg Nebulization Daily   spironolactone  50 mg Oral Daily   torsemide  40 mg Oral BID   Continuous Infusions:   Anti-infectives (From admission, onward)    Start     Dose/Rate Route Frequency Ordered Stop   01/02/23 0900  sulfamethoxazole-trimethoprim (BACTRIM) 400-80 MG per tablet 1 tablet  Status:  Discontinued        1 tablet Oral Once per day on Monday Wednesday Friday 01/01/23 1207 01/12/23 1002   12/03/22 1615  levofloxacin  (LEVAQUIN) tablet 750 mg        750 mg Oral Daily 12/03/22 1520 12/07/22 0831              Family Communication/Anticipated D/C date and plan/Code Status   DVT prophylaxis:      Code Status: Full Code  Family Communication: None Disposition Plan: Plan to discharge home versus SNF   Status is: Inpatient Remains inpatient appropriate because: Awaiting placement       Subjective:   Interval events noted.  He has no complaints.  He said he was able to insert inner canula in tracheostomy without any difficulty today.  No shortness of breath or chest pain.   Objective:    Vitals:   02/08/23 1116 02/08/23 1620 02/08/23 2054 02/09/23 0029  BP:  124/83  115/85  Pulse: 91 86  80  Resp: 16 16  20   Temp:  98.2 F (36.8 C)  98.4 F (36.9 C)  TempSrc:  Oral  Oral  SpO2: 93% 95% 95% 98%  Weight:      Height:       No data found.   Intake/Output Summary (Last 24 hours) at 02/09/2023 1449 Last data filed at 02/09/2023 1041 Gross per 24 hour  Intake 480 ml  Output 2250 ml  Net -1770 ml   Filed Weights   01/23/23 1304  Weight: (!) 193.1 kg    Exam:  GEN: NAD SKIN: Warm and dry EYES: No pallor or icterus ENT: MMM, +trach CV: RRR PULM: CTA B ABD: soft, obese, NT, +BS CNS: AAO x 3, non focal EXT: No edema or tenderness      Data Reviewed:   I have personally reviewed following labs and imaging studies:  Labs: Labs show the following:   Basic Metabolic Panel: Recent Labs  Lab 02/03/23 0900  NA 134*  K 4.1  CL 93*  CO2 31  GLUCOSE 131*  BUN 34*  CREATININE 1.52*  CALCIUM 8.0*   GFR Estimated Creatinine Clearance: 88.6 mL/min (A) (by C-G formula based on SCr of 1.52 mg/dL (H)). Liver Function Tests: No results for input(s): "AST", "ALT", "ALKPHOS", "BILITOT", "PROT", "ALBUMIN" in the last 168 hours. No results for input(s): "LIPASE", "AMYLASE" in the last 168 hours. No results for input(s): "AMMONIA" in the last 168  hours. Coagulation profile No results for input(s): "INR", "PROTIME" in the last 168 hours.  CBC: Recent Labs  Lab 02/05/23 0509  WBC 7.1  HGB 11.2*  HCT 42.6  MCV 79.8*  PLT 244    Cardiac Enzymes: No results for input(s): "CKTOTAL", "CKMB", "CKMBINDEX", "TROPONINI" in the last 168 hours. BNP (last 3 results) No results for input(s): "PROBNP" in the last 8760 hours. CBG: No results for input(s): "GLUCAP" in the last 168 hours. D-Dimer: No results for input(s): "DDIMER" in the last 72 hours. Hgb A1c: No results for input(s): "HGBA1C" in the last 72 hours. Lipid Profile: No results for input(s): "CHOL", "HDL", "LDLCALC", "TRIG", "CHOLHDL", "LDLDIRECT" in the last 72 hours. Thyroid function studies: No results for input(s): "TSH", "T4TOTAL", "T3FREE", "THYROIDAB" in the last 72 hours.  Invalid input(s): "FREET3" Anemia work up: No results for input(s): "VITAMINB12", "FOLATE", "FERRITIN", "TIBC", "IRON", "RETICCTPCT" in the last 72 hours. Sepsis Labs: Recent Labs  Lab 02/05/23 0509  WBC 7.1     Microbiology No results found for this or any previous visit (from the past 240 hour(s)).  Procedures and diagnostic studies:  CT SOFT TISSUE NECK WO CONTRAST  Result Date: 02/08/2023 CLINICAL DATA:  Laryngeal edema, tracheostomy malfunction, mucopurulent expectorant per tracheostomy EXAM: CT NECK WITHOUT CONTRAST TECHNIQUE: Multidetector CT imaging of the neck was performed following the standard protocol without intravenous contrast. RADIATION DOSE REDUCTION: This exam was performed according to the departmental dose-optimization program which includes automated exposure control, adjustment of the mA and/or kV according to patient size and/or use of iterative reconstruction technique. COMPARISON:  No prior CT neck available, correlation is made with 11/28/2022 CT cervical spine FINDINGS: Evaluation is somewhat limited by the absence of intravenous contrast. Pharynx and larynx:  Tracheostomy tube in place. No definite collection around the 2, which appears patent. Otherwise normal noncontrast appearance of the pharynx and larynx. Thinning of the wall between esophagus and trachea, with possible defect (series 2, image 86, best appreciated on lung or bone windows), which appears new from the 11/28/2022  cervical spine CT, although this may be artifactual. Salivary glands: Fatty atrophy of the right submandibular gland. Otherwise normal a salivary glands. No inflammation, mass, or stone. Thyroid: Normal. Lymph nodes: None enlarged or abnormal density. Vascular: Calcifications in the intracranial vertebral and carotid arteries, at the carotid bifurcations, imaged aorta. Otherwise normal noncontrast appearance of the vasculature. Limited intracranial: Negative. Visualized orbits: Negative. Mastoids and visualized paranasal sinuses: Mucosal thickening in the left maxillary sinus. Otherwise clear imaged paranasal sinuses. Fluid in the left mastoid air cells. Skeleton: No acute osseous abnormality. Upper chest: Emphysema. Dependent atelectasis in right greater than left lung. No pleural effusion. Other: None. IMPRESSION: 1. Tracheostomy tube in place. No definite collection around the tube, which appears patent, although evaluation is somewhat limited by the absence of intravenous contrast. 2. Thinning of the wall between esophagus and trachea, with possible defect, which appears new from the 11/28/2022 cervical spine CT but may be artifactual. Correlate with physical exam and direct visualization. 3. Otherwise normal noncontrast appearance of the pharynx and larynx. Aortic Atherosclerosis (ICD10-I70.0) and Emphysema (ICD10-J43.9). Electronically Signed   By: Wiliam Ke M.D.   On: 02/08/2023 18:43               LOS: 71 days   Glenden Rossell  [facility]   Pager on [email or web address]. If 7PM-7AM, please contact night-coverage at [email or web address]     02/09/2023, 2:49 PM

## 2023-02-09 NOTE — Progress Notes (Signed)
Mobility Specialist - Progress Note   02/09/23 1524  Mobility  Activity Transferred from chair to bed  Level of Assistance +2 (takes two people)  Assistive Device Other (Comment) Secondary school teacher)  Distance Ambulated (ft) 0 ft  Activity Response Tolerated well  Mobility Referral Yes  $Mobility charge 1 Mobility  Mobility Specialist Start Time (ACUTE ONLY) 1501  Mobility Specialist Stop Time (ACUTE ONLY) 1524  Mobility Specialist Time Calculation (min) (ACUTE ONLY) 23 min   Transfer from chair to bed +2 via hoyer lift.   [name]  Mobility Specialist  02/09/23 3:26 PM
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
## 2023-02-09 NOTE — Progress Notes (Signed)
Physical Therapy Treatment Patient Details Name: Gary [name] MRN: [id number] DOB: 18-May-1955 Today's Date: 02/09/2023   History of Present Illness 67 y/o male presented to [facility] ED on 11/28/22 for chest pain x 4 days. Admitted for acute on chronic CHF. Frequent admissions this year with most recent dsicharge on 11/21/22. PMH includes obesity, hypoxia, respiratory failure with tracheostomy, COPD, GIB, HFpEF.    PT Comments  Pt was long sitting in bed watching TV upon arrival. He is agreeable to session but requires increased time for all task. Pt does not like to feel rushed. He was able to exit L side of bed, stand to bariatric RW from elevated bed height prior to ambulating ~ 75 ft with chair follow. Chartered loss adjuster working towards Merchant navy officer for future session progressions. Pt did well this date with less overall SOB. Acute PT will continue to follow per current POC.    If plan is discharge home, recommend the following: A lot of help with bathing/dressing/bathroom;Assist for transportation;A lot of help with walking and/or transfers;A little help with walking and/or transfers     Equipment Recommendations  Other (comment) (ongoing assessment)       Precautions / Restrictions Precautions Precautions: Fall Precaution Comments: trach collar/ Trach (6L during gait/ambulation) Restrictions Weight Bearing Restrictions: No     Mobility  Bed Mobility Overal bed mobility: Needs Assistance Bed Mobility: Supine to Sit Rolling: Supervision (uses momentum to achieve EOB sitting. Occasional use of bed rails for repositioning) Supine to sit: Supervision  Transfers Overall transfer level: Needs assistance Equipment used: Rolling walker (2 wheels) (bariatric) Transfers: Sit to/from Stand Sit to Stand: From elevated surface, Supervision  General transfer comment: pt continues to only be willing to stand from very elevated bed height    Ambulation/Gait Ambulation/Gait assistance:  Contact guard assist Gait Distance (Feet): 75 Feet Assistive device: Rolling walker (2 wheels) Gait Pattern/deviations: Step-through pattern Gait velocity: decreased  General Gait Details: pt was able to ambulate ~ 1/2 lap around RN station with chair follow for safety   Balance Overall balance assessment: Needs assistance Sitting-balance support: Feet supported, No upper extremity supported Sitting balance-[name] Scale: Good     Standing balance support: Bilateral upper extremity supported Standing balance-[name] Scale: Fair Standing balance comment: Reliant on RW/AD for all standing activity.       Cognition Arousal: Alert Behavior During Therapy: WFL for tasks assessed/performed Overall Cognitive Status: Within Functional Limits for tasks assessed      General Comments: Pt is A and O x 4               Pertinent Vitals/Pain Pain Assessment Pain Assessment: No/denies pain Pain Score: 0-No pain     PT Goals (current goals can now be found in the care plan section) Acute Rehab PT Goals Patient Stated Goal: Get better so I can get out of here." Progress towards PT goals: Progressing toward goals    Frequency    Min 1X/week      PT Plan Current plan remains appropriate    Co-evaluation     PT goals addressed during session: Mobility/safety with mobility;Balance;Proper use of DME;Strengthening/ROM        AM-PAC PT "6 Clicks" Mobility   Outcome Measure  Help needed turning from your back to your side while in a flat bed without using bedrails?: None Help needed moving from lying on your back to sitting on the side of a flat bed without using bedrails?: None Help needed moving to and from a  bed to a chair (including a wheelchair)?: A Lot Help needed standing up from a chair using your arms (e.g., wheelchair or bedside chair)?: A Lot Help needed to walk in hospital room?: A Lot Help needed climbing 3-5 steps with a railing? : Total 6 Click Score: 15     End of Session Equipment Utilized During Treatment: Oxygen (5L on wall 6 L during ambulation) Activity Tolerance: Patient limited by fatigue Patient left: in chair;with call bell/phone within reach Nurse Communication: Mobility status PT Visit Diagnosis: Muscle weakness (generalized) (M62.81);Difficulty in walking, not elsewhere classified (R26.2)     Time: 6045-4098 PT Time Calculation (min) (ACUTE ONLY): 24 min  Charges:    $Gait Training: 8-22 mins $Therapeutic Activity: 8-22 mins PT General Charges $$ ACUTE PT VISIT: 1 Visit                     Jetta Lout PTA 02/09/23, 2:10 PM

## 2023-02-09 NOTE — Plan of Care (Signed)

## 2023-02-09 NOTE — Plan of Care (Signed)
  Problem: Education: Goal: Knowledge of General Education information will improve Description: Including pain rating scale, medication(s)/side effects and non-pharmacologic comfort measures 02/09/2023 1025 by Emilio Aspen, RN Outcome: Progressing 02/09/2023 1025 by Emilio Aspen, RN Outcome: Progressing   Problem: Health Behavior/Discharge Planning: Goal: Ability to manage health-related needs will improve 02/09/2023 1025 by Emilio Aspen, RN Outcome: Progressing 02/09/2023 1025 by Emilio Aspen, RN Outcome: Progressing   Problem: Clinical Measurements: Goal: Ability to maintain clinical measurements within normal limits will improve 02/09/2023 1025 by Emilio Aspen, RN Outcome: Progressing 02/09/2023 1025 by Emilio Aspen, RN Outcome: Progressing Goal: Will remain free from infection 02/09/2023 1025 by Emilio Aspen, RN Outcome: Progressing 02/09/2023 1025 by Emilio Aspen, RN Outcome: Progressing Goal: Diagnostic test results will improve 02/09/2023 1025 by Emilio Aspen, RN Outcome: Progressing 02/09/2023 1025 by Emilio Aspen, RN Outcome: Progressing Goal: Respiratory complications will improve 02/09/2023 1025 by Emilio Aspen, RN Outcome: Progressing 02/09/2023 1025 by Emilio Aspen, RN Outcome: Progressing Goal: Cardiovascular complication will be avoided 02/09/2023 1025 by Emilio Aspen, RN Outcome: Progressing 02/09/2023 1025 by Emilio Aspen, RN Outcome: Progressing   Problem: Activity: Goal: Risk for activity intolerance will decrease 02/09/2023 1025 by Emilio Aspen, RN Outcome: Progressing 02/09/2023 1025 by Emilio Aspen, RN Outcome: Progressing   Problem: Nutrition: Goal: Adequate nutrition will be maintained 02/09/2023 1025 by Emilio Aspen, RN Outcome: Progressing 02/09/2023 1025 by Emilio Aspen, RN Outcome: Progressing   Problem: Coping: Goal: Level of anxiety will decrease 02/09/2023  1025 by Emilio Aspen, RN Outcome: Progressing 02/09/2023 1025 by Emilio Aspen, RN Outcome: Progressing   Problem: Elimination: Goal: Will not experience complications related to bowel motility 02/09/2023 1025 by Emilio Aspen, RN Outcome: Progressing 02/09/2023 1025 by Emilio Aspen, RN Outcome: Progressing Goal: Will not experience complications related to urinary retention 02/09/2023 1025 by Emilio Aspen, RN Outcome: Progressing 02/09/2023 1025 by Emilio Aspen, RN Outcome: Progressing   Problem: Pain Managment: Goal: General experience of comfort will improve 02/09/2023 1025 by Emilio Aspen, RN Outcome: Progressing 02/09/2023 1025 by Emilio Aspen, RN Outcome: Progressing   Problem: Safety: Goal: Ability to remain free from injury will improve 02/09/2023 1025 by Emilio Aspen, RN Outcome: Progressing 02/09/2023 1025 by Emilio Aspen, RN Outcome: Progressing   Problem: Skin Integrity: Goal: Risk for impaired skin integrity will decrease Outcome: Progressing

## 2023-02-09 NOTE — Progress Notes (Signed)
Patient continues to refuse lovenox injections.

## 2023-02-10 DIAGNOSIS — I5033 Acute on chronic diastolic (congestive) heart failure: Secondary | ICD-10-CM | POA: Diagnosis not present

## 2023-02-10 MED ORDER — APIXABAN 2.5 MG PO TABS
2.5000 mg | ORAL_TABLET | Freq: Two times a day (BID) | ORAL | Status: DC
  Administered 2023-02-10 – 2023-02-12 (×3): 2.5 mg via ORAL
  Filled 2023-02-10 (×4): qty 1

## 2023-02-10 NOTE — Progress Notes (Signed)
Progress Note    Gary [name]  NGE:[id number] DOB: 1956/02/13  DOA: 11/28/2022 PCP: Shayne Alken, MD      Brief Narrative:    Medical records reviewed and are as summarized below:   Gary [name] is a 67 y.o. 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   Gary [name] stated that he ran out of his home torsemide several days ago due to a mixup with his pharmacy.  Then over the last couple days, he has noticed increasing lower extremity swelling, shortness of breath and chest pain.  He states that he usually develops chest pain when he has "fluid on his lungs."    Patient was initially diuresed with IV diuresis, responded well.  Significant improvement in peripheral edema.  IV Lasix was switched with home torsemide once creatinine started increasing.  Patient remained stable from respiratory standpoint point and remained on 6 to 8 L of supplemental oxygen via trach collar which is his baseline.  Patient developed Pseudomonas tracheitis, which was treated with Levaquin as he developed some pain around his tracheostomy.  Patient has end-stage COPD and need to continue follow-up with pulmonary as outpatient  9/25-10/7.  Placement pending. Patient needs a lot of assistance to get out of the bed. Appealing LTACH denial   10/07: ophthalmology saw him today and Dr. 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  Patient more short of breath today.  States the Lasix has not helped him.  Will give 100 mg of torsemide now and switch back to 40 mg torsemide twice daily.  Chest x-ray consistent with cardiomegaly and pulmonary vascular congestion with bibasilar opacities which may represent atelectasis, aspiration and/or pneumonia. 10/10.  Since patient's  white blood cell count was normal and procalcitonin was negative, will hold off on antibiotics since lungs improved with torsemide dosing yesterday. 10/11.  Patient feels that his swelling is a little bit better breathing is a little bit better.  Will start Lexapro for anxiety.  Lac-Hydrin lotion for dryness on his feet. 10/15.  Creatinine stable at 1.48 on torsemide 40 mg twice daily.  Patient down to 6 L trach collar during the day.  Tolerating Lexapro.           Assessment/Plan:   Principal Problem:   Acute on chronic diastolic CHF (congestive heart failure) ([name]) Active Problems:   Acute on chronic respiratory failure with hypoxia and hypercapnia ([name])   COPD (chronic obstructive pulmonary disease) ([name])   Acute kidney injury superimposed on CKD ([name])   OSA (obstructive sleep apnea)   Anxiety   Hypotension   Chest pain   Morbid obesity with BMI of 50.0-59.9, adult ([name])   Generalized weakness   Chronic colitis   Fluid overload   Eyelid cyst, right   Dry skin    Body mass index is 51.83 kg/m. (Morbid obesity):    Acute on chronic diastolic CHF (congestive heart failure) ([name]) Peripheral edema has improved.  Continue torsemide and spironolactone.   Acute on chronic respiratory failure with hypoxia and hypercapnia ([name]), tracheostomy dependent Suspected tracheostomy malfunction: CT soft tissue of neck done on 02/08/2023 reviewed.  Tracheostomy tube is in place and there is no definite collection around the tube.  There are some thickening of the wall between the esophagus  and trachea with possible defect.  Barium swallow has been ordered for further evaluation.  Follow-up with pulmonologist.  He is tolerating 6 L/min oxygen via trach collar and this is his baseline.  He uses home ventilator at night  Pseudomonas tracheitis, hemoptysis Resolved.  Completed antibiotics including Levaquin and Bactrim    COPD (chronic obstructive pulmonary disease) ([name]) Severe COPD  on Combivent, as needed albuterol.  On yuperli nebulizer.   Acute kidney injury superimposed on CKD ([name]) Acute kidney injury on CKD stage IIIa.  Creatinine is stable.     Anxiety On Atarax as needed.  Continue Lexapro   OSA (obstructive sleep apnea) Continue home ventilator at night   Chest pain Resolved.  ACS ruled out.   Hypotension Resolved.  Off midodrine   Dry skin Lac-Hydrin lotion to feet.   Eyelid cyst, right Lanced by ophthalmology on 01/22/2023.   Chronic colitis Continue home mesalamine   Generalized weakness Continue PT as able  Of note, patient has been refusing Lovenox injections.  He said he had significant pain from Lovenox injections about a week ago so has been reluctant to take Lovenox.  Discussed importance of pharmacologic DVT prophylaxis because he is at high risk for DVT or pulmonary embolism.  We also discussed the risk of bleeding from anticoagulants.  He was informed that we could use oral anticoagulants for DVT prophylaxis instead of injectables.  We discussed Eliquis and Xarelto.  He is okay using Eliquis for DVT prophylaxis.  Start low-dose Eliquis 2.5 mg twice daily for DVT prophylaxis.  Discontinue Lovenox.  Diet Order             Diet 2 gram sodium Fluid consistency: Thin; Fluid restriction: 1500 mL Fluid  Diet effective now                            Consultants: Ophthalmologist Otolaryngologist Pulmonologist  Procedures: Expression of fluid from hydrocystoma of right eyelid on 01/22/2023    Medications:    acidophilus  2 capsule Oral BID   amphetamine-dextroamphetamine  30 mg Oral Q breakfast   apixaban  2.5 mg Oral BID   arformoterol  15 mcg Nebulization BID   bacitracin   Topical Daily   bisacodyl  10 mg Oral Daily   docusate sodium  100 mg Oral Daily   escitalopram  10 mg Oral Daily   ferrous sulfate  325 mg Oral Q breakfast   Gerhardt's butt cream   Topical Daily   guaiFENesin  1,200 mg Oral BID    Ipratropium-Albuterol  1 puff Inhalation Q6H   Mesalamine  800 mg Oral BID   polyethylene glycol  17 g Oral BID   spironolactone  50 mg Oral Daily   torsemide  40 mg Oral BID   Continuous Infusions:   Anti-infectives (From admission, onward)    Start     Dose/Rate Route Frequency Ordered Stop   01/02/23 0900  sulfamethoxazole-trimethoprim (BACTRIM) 400-80 MG per tablet 1 tablet  Status:  Discontinued        1 tablet Oral Once per day on Monday Wednesday Friday 01/01/23 1207 01/12/23 1002   12/03/22 1615  levofloxacin (LEVAQUIN) tablet 750 mg        750 mg Oral Daily 12/03/22 1520 12/07/22 0831              Family Communication/Anticipated D/C date and plan/Code Status   DVT prophylaxis: apixaban (ELIQUIS) tablet 2.5 mg Start: 02/10/23 2200 apixaban (  ELIQUIS) tablet 2.5 mg     Code Status: Full Code  Family Communication: None Disposition Plan: Plan to discharge home versus SNF   Status is: Inpatient Remains inpatient appropriate because: Awaiting placement       Subjective:   Interval events noted.  No new complaints.  He feels okay.  No hemoptysis, shortness of breath or chest pain.  No issues with tracheostomy.  Objective:    Vitals:   02/08/23 2054 02/09/23 0029 02/09/23 1629 02/10/23 0727  BP:  115/85 117/82   Pulse:  80 85 79  Resp:  20 16 18   Temp:  98.4 F (36.9 C) 99.2 F (37.3 C)   TempSrc:  Oral    SpO2: 95% 98% 93% 95%  Weight:      Height:       No data found.   Intake/Output Summary (Last 24 hours) at 02/10/2023 1532 Last data filed at 02/10/2023 1106 Gross per 24 hour  Intake 480 ml  Output 2400 ml  Net -1920 ml   Filed Weights   01/23/23 1304  Weight: (!) 193.1 kg    Exam:  GEN: NAD SKIN: Warm and dry EYES: No pallor or icterus ENT: MMM, +trach CV: RRR PULM: CTA B ABD: soft, obese, NT, +BS CNS: AAO x 3, non focal EXT: No edema or tenderness       Data Reviewed:   I have personally reviewed following  labs and imaging studies:  Labs: Labs show the following:   Basic Metabolic Panel: No results for input(s): "NA", "K", "CL", "CO2", "GLUCOSE", "BUN", "CREATININE", "CALCIUM", "MG", "PHOS" in the last 168 hours.  GFR Estimated Creatinine Clearance: 88.6 mL/min (A) (by C-G formula based on SCr of 1.52 mg/dL (H)). Liver Function Tests: No results for input(s): "AST", "ALT", "ALKPHOS", "BILITOT", "PROT", "ALBUMIN" in the last 168 hours. No results for input(s): "LIPASE", "AMYLASE" in the last 168 hours. No results for input(s): "AMMONIA" in the last 168 hours. Coagulation profile No results for input(s): "INR", "PROTIME" in the last 168 hours.  CBC: Recent Labs  Lab 02/05/23 0509  WBC 7.1  HGB 11.2*  HCT 42.6  MCV 79.8*  PLT 244    Cardiac Enzymes: No results for input(s): "CKTOTAL", "CKMB", "CKMBINDEX", "TROPONINI" in the last 168 hours. BNP (last 3 results) No results for input(s): "PROBNP" in the last 8760 hours. CBG: No results for input(s): "GLUCAP" in the last 168 hours. D-Dimer: No results for input(s): "DDIMER" in the last 72 hours. Hgb A1c: No results for input(s): "HGBA1C" in the last 72 hours. Lipid Profile: No results for input(s): "CHOL", "HDL", "LDLCALC", "TRIG", "CHOLHDL", "LDLDIRECT" in the last 72 hours. Thyroid function studies: No results for input(s): "TSH", "T4TOTAL", "T3FREE", "THYROIDAB" in the last 72 hours.  Invalid input(s): "FREET3" Anemia work up: No results for input(s): "VITAMINB12", "FOLATE", "FERRITIN", "TIBC", "IRON", "RETICCTPCT" in the last 72 hours. Sepsis Labs: Recent Labs  Lab 02/05/23 0509  WBC 7.1     Microbiology No results found for this or any previous visit (from the past 240 hour(s)).  Procedures and diagnostic studies:  No results found.             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 If 7PM-7AM, please contact night-coverage at  [email or web address]     02/10/2023, 3:32 PM

## 2023-02-10 NOTE — Progress Notes (Signed)
Mobility Specialist - Progress Note   02/10/23 1316  Mobility  Activity Ambulated with assistance in hallway;Stood at bedside;Dangled on edge of bed  Level of Assistance Standby assist, set-up cues, supervision of patient - no hands on  Assistive Device Front wheel walker  Distance Ambulated (ft) 80 ft  Activity Response Tolerated well  Mobility Referral Yes  $Mobility charge 1 Mobility  Mobility Specialist Start Time (ACUTE ONLY) 1157  Mobility Specialist Stop Time (ACUTE ONLY) 1234  Mobility Specialist Time Calculation (min) (ACUTE ONLY) 37 min   Pt supine in bed asleep on arrival. Pt arouses to light touch and agreeable to mobility. Pt completes bed mobility indep utilizing bed features and extra time. Pt needs MaxA don shoes EOB. Pt STS and ambulates in hallway SBA +2 for chair follow. Pt left in recliner with needs in reach at end of session.   [name]  Mobility Specialist  02/10/23 1:19 PM

## 2023-02-10 NOTE — Progress Notes (Signed)
Mobility Specialist - Progress Note   02/10/23 1532  Mobility  Activity Transferred from chair to bed  Level of Assistance +2 (takes two people)  Assistive Device Other (Comment) Secondary school teacher)  Activity Response Tolerated well  Mobility Referral Yes  $Mobility charge 1 Mobility  Mobility Specialist Start Time (ACUTE ONLY) 1548  Mobility Specialist Stop Time (ACUTE ONLY) 1631  Mobility Specialist Time Calculation (min) (ACUTE ONLY) 43 min   Pt transfer recliner to bed via hoyer lift +2.  [name]  Mobility Specialist  02/10/23 3:33 PM

## 2023-02-10 NOTE — Plan of Care (Signed)

## 2023-02-10 NOTE — Progress Notes (Signed)
SLP Cancellation Note  Patient Details Name: Gary [name] MRN: [id number] DOB: [birth year]   Cancelled treatment:       Reason Eval/Treat Not Completed: SLP screened, no needs identified, will sign off (Noted direct order for MBSS. 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 MD made aware that Barium Esophagram vs endoscopy would be best for direct visualization of fistula. MD to change order. SLP to sign off.)  Clyde Canterbury, M.S., CCC-SLP Speech-Language Pathologist [facility] [phone] Arnette Felts)  Woodroe Chen 02/10/2023, 9:11 AM
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
## 2023-02-11 ENCOUNTER — Other Ambulatory Visit: Payer: Medicare HMO

## 2023-02-11 DIAGNOSIS — I5033 Acute on chronic diastolic (congestive) heart failure: Secondary | ICD-10-CM | POA: Diagnosis not present
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
## 2023-02-11 NOTE — Progress Notes (Signed)
PT taken off of non invasive home vent and placed on 28% ATC for the day. Tol well

## 2023-02-11 NOTE — TOC Progression Note (Signed)
Transition of Care [facility]) - Progression Note    Patient Details  Name: Gary [name] MRN: [id number] Date of Birth: 06-16-1955  Transition of Care [facility]) CM/SW Contact  Marlowe Sax, RN Phone Number: 02/11/2023, 9:24 AM  Clinical Narrative:    The patient was removed from home vent by respiratory and placed on 28% ATC for the day and has tolerated well, he walked 80 Ft with mobility specialist and still requires 2 people to get out of bed, no placements options available   Expected Discharge Plan: Home w Home Health Services Barriers to Discharge: Continued Medical Work up  Expected Discharge Plan and Services     Post Acute Care Choice: Home Health                   DME Arranged: Bedside commode DME Agency: [employer] Date DME Agency Contacted: 01/12/23 Time DME Agency Contacted: 1427 Representative spoke with at DME Agency: [facility] Arranged: PT, OT HH Agency: [facility] Date [facility] Agency Contacted: 01/12/23 Time HH Agency Contacted: 1428 Representative spoke with at [facility] Agency: Kandee Keen   Social Determinants of Health (SDOH) Interventions SDOH Screenings   Food Insecurity: No Food Insecurity (12/01/2022)  Housing: Patient Declined (12/01/2022)  Transportation Needs: No Transportation Needs (12/01/2022)  Utilities: Not At Risk (12/01/2022)  Depression (PHQ2-9): Low Risk  (06/07/2021)  Financial Resource Strain: High Risk (11/21/2022)   Received [facility]  Physical Activity: Insufficiently Active (05/03/2017)  Social Connections: Patient Declined (11/20/2022)   Received from [facility]  Stress: Stress Concern Present (11/20/2022)   Received from [facility]  Tobacco Use: Medium Risk (12/01/2022)    Readmission Risk Interventions    11/05/2022    2:59 PM 11/05/2022   12:16 PM 12/31/2021    8:58 AM  Readmission Risk Prevention Plan  Transportation Screening Complete Complete Complete  Medication Review [employer]) Complete Complete Complete   PCP or Specialist appointment within 3-5 days of discharge Complete Complete Complete  HRI or Home Care Consult Complete Complete Complete  SW Recovery Care/Counseling Consult Complete Complete Complete  Palliative Care Screening Complete Complete Complete  Skilled Nursing Facility Not Applicable Not Applicable Not Applicable

## 2023-02-11 NOTE — Progress Notes (Signed)
Progress Note    Gary [name]  BMW:[id number] DOB: [birth year]  DOA: 11/28/2022 PCP: Shayne Alken, MD      Brief Narrative:    Medical records reviewed and are as summarized below:   Gary [name] is a 67 y.o. 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   Gary [name] stated that he ran out of his home torsemide several days ago due to a mixup with his pharmacy.  Then over the last couple days, he has noticed increasing lower extremity swelling, shortness of breath and chest pain.  He states that he usually develops chest pain when he has "fluid on his lungs."    Patient was initially diuresed with IV diuresis, responded well.  Significant improvement in peripheral edema.  IV Lasix was switched with home torsemide once creatinine started increasing.  Patient remained stable from respiratory standpoint point and remained on 6 to 8 L of supplemental oxygen via trach collar which is his baseline.  Patient developed Pseudomonas tracheitis, which was treated with Levaquin as he developed some pain around his tracheostomy.  Patient has end-stage COPD and need to continue follow-up with pulmonary as outpatient  9/25-10/7.  Placement pending. Patient needs a lot of assistance to get out of the bed. Appealing LTACH denial   10/07: ophthalmology saw him today and Gary [name] drained hydrocystoma on R eyelid  10/9.  Patient more short of breath today.  States the Lasix has not helped him.  Will give 100 mg of torsemide now and switch back to 40 mg torsemide twice daily.  Chest x-ray consistent with cardiomegaly and pulmonary vascular congestion with bibasilar opacities which may represent atelectasis, aspiration and/or pneumonia. 10/10.  Since patient's  white blood cell count was normal and procalcitonin was negative, will hold off on antibiotics since lungs improved with torsemide dosing yesterday. 10/11.  Patient feels that his swelling is a little bit better breathing is a little bit better.  Will start Lexapro for anxiety.  Lac-Hydrin lotion for dryness on his feet. 10/15.  Creatinine stable at 1.48 on torsemide 40 mg twice daily.  Patient down to 6 L trach collar during the day.  Tolerating Lexapro.           Assessment/Plan:   Principal Problem:   Acute on chronic diastolic CHF (congestive heart failure) ([name]) Active Problems:   Acute on chronic respiratory failure with hypoxia and hypercapnia ([name])   COPD (chronic obstructive pulmonary disease) ([name])   Acute kidney injury superimposed on CKD ([name])   OSA (obstructive sleep apnea)   Anxiety   Hypotension   Chest pain   Morbid obesity with BMI of 50.0-59.9, adult ([name])   Generalized weakness   Chronic colitis   Fluid overload   Eyelid cyst, right   Dry skin    Body mass index is 51.83 kg/m. (Morbid obesity):    Acute on chronic diastolic CHF (congestive heart failure) ([name]) Peripheral edema has improved.  Continue torsemide and spironolactone.   Acute on chronic respiratory failure with hypoxia and hypercapnia ([name]), tracheostomy dependent Suspected tracheostomy malfunction: CT soft tissue of neck done on 02/08/2023 reviewed.  Tracheostomy tube is in place and there is no definite collection around the tube.  There is thinning of the wall between the esophagus and  trachea with possible defect. Case discussed with Gary [name], radiologist and Gary [name], pulmonologist, via secure chat. Per Gary [name], patient will require prone positioning for barium swallow study because suspected defect is along the anterior wall of the esophagus.  Unfortunately putting the patient in the prone position is not feasible at this time and he is also too big to fit on the table.   Gastroenterologist has been consulted by Dr.  Karna [name], to see whether upper esophagus can be evaluated endoscopically.  He is tolerating 6 L/min oxygen via trach collar and this is his baseline.  He uses home ventilator at night  Pseudomonas tracheitis, hemoptysis Resolved.  Completed antibiotics including Levaquin and Bactrim    COPD (chronic obstructive pulmonary disease) ([name]) Severe COPD on Combivent, as needed albuterol.  On yuperli nebulizer.   Acute kidney injury superimposed on CKD ([name]) Acute kidney injury on CKD stage IIIa.  Creatinine is stable.     Anxiety On Atarax as needed.  Continue Lexapro   OSA (obstructive sleep apnea) Continue home ventilator at night   Chest pain Resolved.  ACS ruled out.   Hypotension Resolved.  Off midodrine   Dry skin Lac-Hydrin lotion to feet.   Eyelid cyst, right Lanced by ophthalmology on 01/22/2023.   Chronic colitis Continue home mesalamine   Generalized weakness Continue PT as able  He had been refusing Lovenox injection for DVT prophylaxis.  He had agreed to use low-dose Eliquis for DVT prophylaxis    Diet Order             Diet Heart Fluid consistency: Thin  Diet effective now                            Consultants: Ophthalmologist Otolaryngologist Pulmonologist  Procedures: Expression of fluid from hydrocystoma of right eyelid on 01/22/2023    Medications:    acidophilus  2 capsule Oral BID   amphetamine-dextroamphetamine  30 mg Oral Q breakfast   apixaban  2.5 mg Oral BID   arformoterol  15 mcg Nebulization BID   bacitracin   Topical Daily   bisacodyl  10 mg Oral Daily   docusate sodium  100 mg Oral Daily   escitalopram  10 mg Oral Daily   ferrous sulfate  325 mg Oral Q breakfast   Gerhardt's butt cream   Topical Daily   guaiFENesin  1,200 mg Oral BID   Ipratropium-Albuterol  1 puff Inhalation Q6H   Mesalamine  800 mg Oral BID   polyethylene glycol  17 g Oral BID    spironolactone  50 mg Oral Daily   torsemide  40 mg Oral BID   Continuous Infusions:   Anti-infectives (From admission, onward)    Start     Dose/Rate Route Frequency Ordered Stop   01/02/23 0900  sulfamethoxazole-trimethoprim (BACTRIM) 400-80 MG per tablet 1 tablet  Status:  Discontinued        1 tablet Oral Once per day on Monday Wednesday Friday 01/01/23 1207 01/12/23 1002   12/03/22 1615  levofloxacin (LEVAQUIN) tablet 750 mg        750 mg Oral Daily 12/03/22 1520 12/07/22 0831              Family Communication/Anticipated D/C date and plan/Code Status   DVT prophylaxis: apixaban (ELIQUIS) tablet 2.5 mg Start: 02/10/23 2200 apixaban (ELIQUIS) tablet 2.5 mg     Code Status: Full Code  Family Communication: None Disposition Plan: Plan to discharge  home versus SNF   Status is: Inpatient Remains inpatient appropriate because: Awaiting placement       Subjective:   No acute issues overnight.  No cough, hemoptysis, shortness of breath or chest pain.  Objective:    Vitals:   02/10/23 0727 02/10/23 1949 02/10/23 2247 02/11/23 0807  BP:   108/83 109/80  Pulse: 79  81 80  Resp: 18  20 14   Temp:   97.8 F (36.6 C) 97.8 F (36.6 C)  TempSrc:      SpO2: 95% 95% 98% 97%  Weight:      Height:       No data found.   Intake/Output Summary (Last 24 hours) at 02/11/2023 1458 Last data filed at 02/11/2023 1450 Gross per 24 hour  Intake 240 ml  Output 2300 ml  Net -2060 ml   Filed Weights   01/23/23 1304  Weight: (!) 193.1 kg    Exam:  GEN: NAD SKIN: Warm and dry EYES: EOMI ENT: MMM, +trach  CV: RRR PULM: CTA B ABD: soft, ND, NT, +BS CNS: AAO x 3, non focal EXT: No edema or tenderness     Data Reviewed:   I have personally reviewed following labs and imaging studies:  Labs: Labs show the following:   Basic Metabolic Panel: No results for input(s): "NA", "K", "CL", "CO2", "GLUCOSE", "BUN", "CREATININE", "CALCIUM", "MG", "PHOS" in the  last 168 hours.  GFR Estimated Creatinine Clearance: 88.6 mL/min (A) (by C-G formula based on SCr of 1.52 mg/dL (H)). Liver Function Tests: No results for input(s): "AST", "ALT", "ALKPHOS", "BILITOT", "PROT", "ALBUMIN" in the last 168 hours. No results for input(s): "LIPASE", "AMYLASE" in the last 168 hours. No results for input(s): "AMMONIA" in the last 168 hours. Coagulation profile No results for input(s): "INR", "PROTIME" in the last 168 hours.  CBC: Recent Labs  Lab 02/05/23 0509  WBC 7.1  HGB 11.2*  HCT 42.6  MCV 79.8*  PLT 244    Cardiac Enzymes: No results for input(s): "CKTOTAL", "CKMB", "CKMBINDEX", "TROPONINI" in the last 168 hours. BNP (last 3 results) No results for input(s): "PROBNP" in the last 8760 hours. CBG: No results for input(s): "GLUCAP" in the last 168 hours. D-Dimer: No results for input(s): "DDIMER" in the last 72 hours. Hgb A1c: No results for input(s): "HGBA1C" in the last 72 hours. Lipid Profile: No results for input(s): "CHOL", "HDL", "LDLCALC", "TRIG", "CHOLHDL", "LDLDIRECT" in the last 72 hours. Thyroid function studies: No results for input(s): "TSH", "T4TOTAL", "T3FREE", "THYROIDAB" in the last 72 hours.  Invalid input(s): "FREET3" Anemia work up: No results for input(s): "VITAMINB12", "FOLATE", "FERRITIN", "TIBC", "IRON", "RETICCTPCT" in the last 72 hours. Sepsis Labs: Recent Labs  Lab 02/05/23 0509  WBC 7.1     Microbiology No results found for this or any previous visit (from the past 240 hour(s)).  Procedures and diagnostic studies:  No results found.             LOS: 73 days   Arine Foley  [employer] loss adjuster on [email or web address]. If 7PM-7AM, please contact night-coverage at [email or web address]     02/11/2023, 2:58 PM
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
## 2023-02-12 DIAGNOSIS — I5033 Acute on chronic diastolic (congestive) heart failure: Secondary | ICD-10-CM | POA: Diagnosis not present

## 2023-02-12 LAB — CBC
HCT: 41.7 % (ref 39.0–52.0)
Hemoglobin: 11.3 g/dL — ABNORMAL LOW (ref 13.0–17.0)
MCH: 20.8 pg — ABNORMAL LOW (ref 26.0–34.0)
MCHC: 27.1 g/dL — ABNORMAL LOW (ref 30.0–36.0)
MCV: 76.8 fL — ABNORMAL LOW (ref 80.0–100.0)
Platelets: 221 10*3/uL (ref 150–400)
RBC: 5.43 MIL/uL (ref 4.22–5.81)
RDW: 18.9 % — ABNORMAL HIGH (ref 11.5–15.5)
WBC: 7.7 10*3/uL (ref 4.0–10.5)
nRBC: 0 % (ref 0.0–0.2)

## 2023-02-12 LAB — BASIC METABOLIC PANEL
Anion gap: 8 (ref 5–15)
BUN: 46 mg/dL — ABNORMAL HIGH (ref 8–23)
CO2: 29 mmol/L (ref 22–32)
Calcium: 8.3 mg/dL — ABNORMAL LOW (ref 8.9–10.3)
Chloride: 99 mmol/L (ref 98–111)
Creatinine, Ser: 1.73 mg/dL — ABNORMAL HIGH (ref 0.61–1.24)
GFR, Estimated: 43 mL/min — ABNORMAL LOW (ref >60)
Glucose, Bld: 117 mg/dL — ABNORMAL HIGH (ref 70–99)
Potassium: 3.9 mmol/L (ref 3.5–5.1)
Sodium: 136 mmol/L (ref 135–145)

## 2023-02-12 MED ORDER — APIXABAN 2.5 MG PO TABS
2.5000 mg | ORAL_TABLET | Freq: Two times a day (BID) | ORAL | Status: DC
  Administered 2023-02-13 – 2023-04-08 (×108): 2.5 mg via ORAL
  Filled 2023-02-12 (×110): qty 1

## 2023-02-12 MED ORDER — TORSEMIDE 20 MG PO TABS: 20.0000 mg | ORAL_TABLET | Freq: Two times a day (BID) | ORAL | Status: DC

## 2023-02-12 MED ORDER — SPIRONOLACTONE 25 MG PO TABS
25.0000 mg | ORAL_TABLET | Freq: Every day | ORAL | Status: DC
  Administered 2023-02-13 – 2023-04-23 (×70): 25 mg via ORAL
  Filled 2023-02-12 (×70): qty 1

## 2023-02-12 MED ORDER — TORSEMIDE 20 MG PO TABS
20.0000 mg | ORAL_TABLET | Freq: Two times a day (BID) | ORAL | Status: DC
  Administered 2023-02-13 – 2023-02-15 (×5): 20 mg via ORAL
  Filled 2023-02-12 (×4): qty 1

## 2023-02-12 NOTE — Progress Notes (Signed)
Physical Therapy Treatment Patient Details Name: Gary [name] MRN: [id number] DOB: [birth year] Today's Date: 02/12/2023   History of Present Illness 67 y/o male presented to [facility] ED on 11/28/22 for chest pain x 4 days. Admitted for acute on chronic CHF. Frequent admissions this year with most recent dsicharge on 11/21/22. PMH includes obesity, hypoxia, respiratory failure with tracheostomy, COPD, GIB, HFpEF.    PT Comments  Pt continues to progress with mobility and gait training. Tolerated 1/2 way around nurse's station today with support of RW and Minimal encouragement. Pt stated he would double his distance tolerance by Friday. Continue per POC.    If plan is discharge home, recommend the following: A lot of help with bathing/dressing/bathroom;Assist for transportation;A lot of help with walking and/or transfers;A little help with walking and/or transfers   Can travel by private vehicle     No  Equipment Recommendations  Other (comment) (Ongoing assessment)    Recommendations for Other Services       Precautions / Restrictions Precautions Precautions: Fall Precaution Comments: trach collar/ Trach Restrictions Weight Bearing Restrictions: No     Mobility  Bed Mobility Overal bed mobility: Needs Assistance Bed Mobility: Supine to Sit Rolling: Supervision   Supine to sit: Supervision Sit to supine: Mod assist, Used rails, HOB elevated   General bed mobility comments: assist for BLE    Transfers Overall transfer level: Needs assistance Equipment used: Rolling walker (2 wheels) Transfers: Sit to/from Stand Sit to Stand: From elevated surface, Supervision           General transfer comment: multiple attempts and momentum to achieve standing as bariatric bed rail impacting proper RLE positioning.    Ambulation/Gait Ambulation/Gait assistance: Contact guard assist Gait Distance (Feet): 90 Feet Assistive device: Rolling walker (2 wheels) Gait Pattern/deviations:  Step-through pattern Gait velocity: decreased     General Gait Details: pt was able to ambulate ~ 1/2 lap around RN station with chair follow for safety   Stairs             Wheelchair Mobility     Tilt Bed    Modified Rankin (Stroke Patients Only)       Balance Overall balance assessment: Needs assistance Sitting-balance support: Feet supported, No upper extremity supported Sitting balance-[name] Scale: Good Sitting balance - Comments: sitting EOB   Standing balance support: Bilateral upper extremity supported Standing balance-[name] Scale: Fair                              Cognition Arousal: Alert Behavior During Therapy: WFL for tasks assessed/performed Overall Cognitive Status: Within Functional Limits for tasks assessed                                 General Comments: Pt is A and O x 4        Exercises      General Comments        Pertinent Vitals/Pain Pain Assessment Pain Assessment: No/denies pain    Home Living                          Prior Function            PT Goals (current goals can now be found in the care plan section) Acute Rehab PT Goals Patient Stated Goal: Get better so I can get out of  here."    Frequency    Min 1X/week      PT Plan Current plan remains appropriate    Co-evaluation              AM-PAC PT "6 Clicks" Mobility   Outcome Measure  Help needed turning from your back to your side while in a flat bed without using bedrails?: None Help needed moving from lying on your back to sitting on the side of a flat bed without using bedrails?: None Help needed moving to and from a bed to a chair (including a wheelchair)?: A Lot Help needed standing up from a chair using your arms (e.g., wheelchair or bedside chair)?: A Lot Help needed to walk in hospital room?: A Lot Help needed climbing 3-5 steps with a railing? : Total 6 Click Score: 15    End of Session Equipment  Utilized During Treatment: Oxygen Activity Tolerance: Patient limited by fatigue Patient left: in chair;with call bell/phone within reach Nurse Communication: Mobility status PT Visit Diagnosis: Muscle weakness (generalized) (M62.81);Difficulty in walking, not elsewhere classified (R26.2)     Time: 7628-3151 PT Time Calculation (min) (ACUTE ONLY): 8 min  Charges:    $Gait Training: 8-22 mins PT General Charges $$ ACUTE PT VISIT: 1 Visit                    Zadie Cleverly, PTA  Jannet Askew 02/12/2023, 2:07 PM

## 2023-02-12 NOTE — Progress Notes (Signed)
Mobility Specialist - Progress Note   02/12/23 1500  Mobility  Activity Transferred from chair to bed  Level of Assistance Other (Comment)  Assistive Device Sling  $Mobility charge 1 Mobility     Pt transferred bed-chair via hoyer. Needs in reach.    Filiberto Pinks Mobility Specialist 02/12/23, 3:24 PM

## 2023-02-12 NOTE — Progress Notes (Addendum)
Progress Note    Gary [name]  PIR:[id number] DOB: 02/14/1956  DOA: 11/28/2022 PCP: Shayne Alken, MD      Brief Narrative:    Medical records reviewed and are as summarized below:   Gary [name] is a 67 y.o. 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   Gary [name] stated that Gary [name] ran out of his home torsemide several days ago due to a mixup with his pharmacy.  Then over the last couple days, Gary [name] has noticed increasing lower extremity swelling, shortness of breath and chest pain.  Gary [name] states that Gary [name] usually develops chest pain when Gary [name] has "fluid on his lungs."    Gary [name] was initially diuresed with IV diuresis, responded well.  Significant improvement in peripheral edema.  IV Lasix was switched with home torsemide once creatinine started increasing.  Gary [name] remained stable from respiratory standpoint point and remained on 6 to 8 L of supplemental oxygen via trach collar which is his baseline.  Gary [name] developed Pseudomonas tracheitis, which was treated with Levaquin as Gary [name] developed some pain around his tracheostomy.  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  Placement pending. Gary [name] needs a lot of assistance to get out of the bed. Appealing LTACH denial   10/07: ophthalmology saw him today and Dr. 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  Gary [name] more short of breath today.  States the Lasix has not helped him.  Will give 100 mg of torsemide now and switch back to 40 mg torsemide twice daily.  Chest x-ray consistent with cardiomegaly and pulmonary vascular congestion with bibasilar opacities which may represent atelectasis, aspiration and/or pneumonia. 10/10.  Since Gary [name]'s  white blood cell count was normal and procalcitonin was negative, will hold off on antibiotics since lungs improved with torsemide dosing yesterday. 10/11.  Gary [name] feels that his swelling is a little bit better breathing is a little bit better.  Will start Lexapro for anxiety.  Lac-Hydrin lotion for dryness on his feet. 10/15.  Creatinine stable at 1.48 on torsemide 40 mg twice daily.  Gary [name] down to 6 L trach collar during the day.  Tolerating Lexapro.           Assessment/Plan:   Principal Problem:   Acute on chronic diastolic CHF (congestive heart failure) ([name]) Active Problems:   Acute on chronic respiratory failure with hypoxia and hypercapnia ([name])   COPD (chronic obstructive pulmonary disease) ([name])   Acute kidney injury superimposed on CKD ([name])   OSA (obstructive sleep apnea)   Anxiety   Hypotension   Chest pain   Morbid obesity with BMI of 50.0-59.9, adult ([name])   Generalized weakness   Chronic colitis   Fluid overload   Eyelid cyst, right   Dry skin    Body mass index is 51.83 kg/m. (Morbid obesity):    Acute on chronic diastolic CHF (congestive heart failure) ([name]) Peripheral edema has improved. Creatinine trending upward, up to 1.73.  Repeat BMP tomorrow Hold torsemide and spironolactone for now.  Torsemide and spironolactone may be resumed if creatinine stabilizes.    Acute on chronic respiratory failure with hypoxia and hypercapnia ([name]), tracheostomy dependent Suspected tracheostomy malfunction: CT soft tissue of neck done on 02/08/2023 reviewed.  Tracheostomy tube is  in place and there is no definite collection around the tube.  There is thinning of the wall between the esophagus and trachea with possible defect. Case discussed with Dr. Roda Shutters, radiologist and Dr. Karna Christmas, pulmonologist, via secure chat. Per Dr. Roda Shutters, Gary [name] will require prone positioning for barium swallow study because suspected defect is along the anterior wall of the esophagus.   Unfortunately putting the Gary [name] in the prone position is not feasible at this time and Gary [name] is also too big to fit on the table.  Plan for EGD tomorrow for further evaluation suspected upper esophageal defect  Gary [name] is tolerating 6 L/min oxygen via trach collar and this is his baseline.  Gary [name] uses home ventilator at night  Pseudomonas tracheitis, hemoptysis Resolved.  Completed antibiotics including Levaquin and Bactrim    COPD (chronic obstructive pulmonary disease) ([name]) Severe COPD on Combivent, as needed albuterol.  On yuperli nebulizer.   Acute kidney injury superimposed on CKD ([name]) Acute kidney injury on CKD stage IIIa.  Creatinine is stable.     Anxiety Continue Lexapro and Atarax as needed.   OSA (obstructive sleep apnea) Continue home ventilator at night   Chest pain Resolved.  ACS ruled out.   Hypotension Resolved.  Off midodrine   Dry skin Lac-Hydrin lotion to feet.   Eyelid cyst, right Lanced by ophthalmology on 01/22/2023.   Chronic colitis Continue mesalamine   Generalized weakness Continue PT as able  Use low-dose Eliquis for DVT prophylaxis Hold Lovenox tonight and tomorrow morning in anticipation of EGD tomorrow. Gary [name] had been refusing Lovenox injections.     Diet Order             Diet NPO time specified  Diet effective midnight           Diet Heart Room service appropriate? Yes; Fluid consistency: Thin  Diet effective now                            Consultants: Ophthalmologist Otolaryngologist Pulmonologist  Procedures: Expression of fluid from hydrocystoma of right eyelid on 01/22/2023    Medications:    acidophilus  2 capsule Oral BID   amphetamine-dextroamphetamine  30 mg Oral Q breakfast   [START ON 02/13/2023] apixaban  2.5 mg Oral BID   arformoterol  15 mcg Nebulization BID   bacitracin   Topical Daily   bisacodyl  10 mg Oral Daily   docusate sodium  100 mg Oral Daily   escitalopram  10 mg Oral Daily   ferrous  sulfate  325 mg Oral Q breakfast   Gerhardt's butt cream   Topical Daily   guaiFENesin  1,200 mg Oral BID   Ipratropium-Albuterol  1 puff Inhalation Q6H   Mesalamine  800 mg Oral BID   polyethylene glycol  17 g Oral BID   [START ON 02/13/2023] spironolactone  25 mg Oral Daily   [START ON 02/13/2023] torsemide  20 mg Oral BID   Continuous Infusions:   Anti-infectives (From admission, onward)    Start     Dose/Rate Route Frequency Ordered Stop   01/02/23 0900  sulfamethoxazole-trimethoprim (BACTRIM) 400-80 MG per tablet 1 tablet  Status:  Discontinued        1 tablet Oral Once per day on Monday Wednesday Friday 01/01/23 1207 01/12/23 1002   12/03/22 1615  levofloxacin (LEVAQUIN) tablet 750 mg        750 mg Oral Daily 12/03/22 1520 12/07/22 0831  Family Communication/Anticipated D/C date and plan/Code Status   DVT prophylaxis: apixaban (ELIQUIS) tablet 2.5 mg Start: 02/13/23 2200 apixaban (ELIQUIS) tablet 2.5 mg     Code Status: Full Code  Family Communication: None Disposition Plan: Plan to discharge home versus SNF   Status is: Inpatient Remains inpatient appropriate because: Awaiting placement       Subjective:   Interval events noted.  No shortness of breath or chest pain.  No issues with trach today.  Objective:    Vitals:   02/11/23 0807 02/11/23 1513 02/12/23 0006 02/12/23 0753  BP: 109/80 119/81 119/84 108/70  Pulse: 80 82 78 83  Resp: 14 14 15 14   Temp: 97.8 F (36.6 C) (!) 97.3 F (36.3 C) (!) 97.5 F (36.4 C) 97.9 F (36.6 C)  TempSrc:      SpO2: 97% 94% 96% 97%  Weight:      Height:       No data found.   Intake/Output Summary (Last 24 hours) at 02/12/2023 1443 Last data filed at 02/12/2023 1424 Gross per 24 hour  Intake 240 ml  Output 3200 ml  Net -2960 ml   Filed Weights   01/23/23 1304  Weight: (!) 193.1 kg    Exam:  GEN: NAD SKIN: No rash EYES: EOMI ENT: MMM, +trach CV: RRR PULM: CTA B ABD: soft,  obese, NT, +BS CNS: AAO x 3, non focal EXT: No edema or tenderness      Data Reviewed:   I have personally reviewed following labs and imaging studies:  Labs: Labs show the following:   Basic Metabolic Panel: Recent Labs  Lab 02/12/23 0757  NA 136  K 3.9  CL 99  CO2 29  GLUCOSE 117*  BUN 46*  CREATININE 1.73*  CALCIUM 8.3*    GFR Estimated Creatinine Clearance: 77.9 mL/min (A) (by C-G formula based on SCr of 1.73 mg/dL (H)). Liver Function Tests: No results for input(s): "AST", "ALT", "ALKPHOS", "BILITOT", "PROT", "ALBUMIN" in the last 168 hours. No results for input(s): "LIPASE", "AMYLASE" in the last 168 hours. No results for input(s): "AMMONIA" in the last 168 hours. Coagulation profile No results for input(s): "INR", "PROTIME" in the last 168 hours.  CBC: Recent Labs  Lab 02/12/23 0757  WBC 7.7  HGB 11.3*  HCT 41.7  MCV 76.8*  PLT 221    Cardiac Enzymes: No results for input(s): "CKTOTAL", "CKMB", "CKMBINDEX", "TROPONINI" in the last 168 hours. BNP (last 3 results) No results for input(s): "PROBNP" in the last 8760 hours. CBG: No results for input(s): "GLUCAP" in the last 168 hours. D-Dimer: No results for input(s): "DDIMER" in the last 72 hours. Hgb A1c: No results for input(s): "HGBA1C" in the last 72 hours. Lipid Profile: No results for input(s): "CHOL", "HDL", "LDLCALC", "TRIG", "CHOLHDL", "LDLDIRECT" in the last 72 hours. Thyroid function studies: No results for input(s): "TSH", "T4TOTAL", "T3FREE", "THYROIDAB" in the last 72 hours.  Invalid input(s): "FREET3" Anemia work up: No results for input(s): "VITAMINB12", "FOLATE", "FERRITIN", "TIBC", "IRON", "RETICCTPCT" in the last 72 hours. Sepsis Labs: Recent Labs  Lab 02/12/23 0757  WBC 7.7     Microbiology No results found for this or any previous visit (from the past 240 hour(s)).  Procedures and diagnostic studies:  No results found.             LOS: 74 days    Saranda Legrande  [employer] loss adjuster on [email or web address]. If 7PM-7AM, please contact night-coverage at [email or web address]     02/12/2023,  2:43 PM
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
## 2023-02-12 NOTE — Care Plan (Signed)
Patient had eaten today will plan for EGD today. NPO at midnight.  Merlyn Lot MD, MPH [facility]

## 2023-02-12 NOTE — Progress Notes (Signed)
Occupational Therapy Treatment Patient Details Name: Gary [name] MRN: [id number] DOB: April 06, 1956 Today's Date: 02/12/2023   History of present illness 67 y/o male presented to [facility] ED on 11/28/22 for chest pain x 4 days. Admitted for acute on chronic CHF. Frequent admissions this year with most recent dsicharge on 11/21/22. PMH includes obesity, hypoxia, respiratory failure with tracheostomy, COPD, GIB, HFpEF.   OT comments  Gary [name] was seen for OT treatment on this date. Upon arrival to room pt reclined in bed, agreeable to tx. Pt requires SUPERVISION exit bed. MAX A don B shoes in sitting, MAX A pericare standing. Tolerated ~100 ft ADL t/f with SBA + RW, distance limited by shortness of breath. Pt making good progress toward goals, will continue to follow POC. Discharge recommendation remains appropriate.      If plan is discharge home, recommend the following:  A little help with walking and/or transfers;A lot of help with bathing/dressing/bathroom   Equipment Recommendations  BSC/3in1    Recommendations for Other Services      Precautions / Restrictions Precautions Precautions: Fall Precaution Comments: trach collar/ Trach Restrictions Weight Bearing Restrictions: No       Mobility Bed Mobility Overal bed mobility: Needs Assistance Bed Mobility: Supine to Sit     Supine to sit: Supervision          Transfers Overall transfer level: Needs assistance Equipment used: Rolling walker (2 wheels) Transfers: Sit to/from Stand Sit to Stand: From elevated surface, Supervision           General transfer comment: multiple attempts and momentum to achieve standing as bariatric bed rail impacting proper RLE positioning.     Balance Overall balance assessment: Needs assistance Sitting-balance support: Feet supported, No upper extremity supported Sitting balance-[name] Scale: Good     Standing balance support: Bilateral upper extremity supported Standing  balance-[name] Scale: Fair                             ADL either performed or assessed with clinical judgement   ADL Overall ADL's : Needs assistance/impaired                                       General ADL Comments: MAX A don B shoes in sitting, MAX A pericare standing      Cognition Arousal: Alert Behavior During Therapy: WFL for tasks assessed/performed Overall Cognitive Status: Within Functional Limits for tasks assessed                                                     Pertinent Vitals/ Pain       Pain Assessment Pain Assessment: No/denies pain   Frequency  Min 1X/week        Progress Toward Goals  OT Goals(current goals can now be found in the care plan section)  Progress towards OT goals: Progressing toward goals  Acute Rehab OT Goals Patient Stated Goal: to go home OT Goal Formulation: With patient Time For Goal Achievement: 02/28/23 Potential to Achieve Goals: Good ADL Goals Pt Will Perform Grooming: with modified independence;standing Pt Will Transfer to Toilet: with modified independence;ambulating;regular height toilet Pt Will Perform Toileting - Clothing Manipulation and hygiene: with  mod assist;sit to/from stand Additional ADL Goal #1: Pt will stand from the hospital bed or recliner adn LR DME with min A as a precursor to ADLs  Plan Discharge plan remains appropriate;Frequency remains appropriate    Co-evaluation                 AM-PAC OT "6 Clicks" Daily Activity     Outcome Measure   Help from another person eating meals?: None Help from another person taking care of personal grooming?: A Little Help from another person toileting, which includes using toliet, bedpan, or urinal?: A Lot Help from another person bathing (including washing, rinsing, drying)?: A Lot Help from another person to put on and taking off regular upper body clothing?: None Help from another person to put on and  taking off regular lower body clothing?: A Lot 6 Click Score: 17    End of Session    OT Visit Diagnosis: Unsteadiness on feet (R26.81);Other abnormalities of gait and mobility (R26.89);Muscle weakness (generalized) (M62.81);Adult, failure to thrive (R62.7)   Activity Tolerance Patient tolerated treatment well   Patient Left in chair;with call bell/phone within reach   Nurse Communication Mobility status        Time: 1610-9604 OT Time Calculation (min): 24 min  Charges: OT General Charges $OT Visit: 1 Visit OT Treatments $Self Care/Home Management : 8-22 mins  Kathie Dike, M.S. OTR/L  02/12/23, 1:29 PM  ascom [phone]

## 2023-02-12 NOTE — Progress Notes (Signed)
Trach changed without complications, placed #7 XLT proximal, equal breath sounds, positive change on ETCO2 detector.

## 2023-02-13 ENCOUNTER — Inpatient Hospital Stay: Payer: Medicare HMO | Admitting: Anesthesiology

## 2023-02-13 ENCOUNTER — Encounter: Payer: Self-pay | Admitting: Hospitalist

## 2023-02-13 ENCOUNTER — Encounter
Admission: EM | Disposition: A | Discharge status: Home Health | Payer: Self-pay | Source: Home / Self Care | Attending: Internal Medicine

## 2023-02-13 DIAGNOSIS — I5033 Acute on chronic diastolic (congestive) heart failure: Secondary | ICD-10-CM | POA: Diagnosis not present

## 2023-02-13 DIAGNOSIS — R933 Abnormal findings on diagnostic imaging of other parts of digestive tract: Secondary | ICD-10-CM | POA: Diagnosis not present

## 2023-02-13 DIAGNOSIS — K449 Diaphragmatic hernia without obstruction or gangrene: Secondary | ICD-10-CM | POA: Diagnosis not present

## 2023-02-13 DIAGNOSIS — K2289 Other specified disease of esophagus: Secondary | ICD-10-CM | POA: Diagnosis not present

## 2023-02-13 HISTORY — PX: ESOPHAGOGASTRODUODENOSCOPY: SHX5428

## 2023-02-13 LAB — BASIC METABOLIC PANEL
Anion gap: 9 (ref 5–15)
BUN: 44 mg/dL — ABNORMAL HIGH (ref 8–23)
CO2: 29 mmol/L (ref 22–32)
Calcium: 8.3 mg/dL — ABNORMAL LOW (ref 8.9–10.3)
Chloride: 99 mmol/L (ref 98–111)
Creatinine, Ser: 1.5 mg/dL — ABNORMAL HIGH (ref 0.61–1.24)
GFR, Estimated: 51 mL/min — ABNORMAL LOW (ref >60)
Glucose, Bld: 118 mg/dL — ABNORMAL HIGH (ref 70–99)
Potassium: 3.8 mmol/L (ref 3.5–5.1)
Sodium: 137 mmol/L (ref 135–145)

## 2023-02-13 SURGERY — EGD (ESOPHAGOGASTRODUODENOSCOPY)
Anesthesia: General

## 2023-02-13 MED ORDER — LIDOCAINE HCL (CARDIAC) PF 100 MG/5ML IV SOSY
PREFILLED_SYRINGE | INTRAVENOUS | Status: DC | PRN
  Administered 2023-02-13: 50 mg via INTRAVENOUS

## 2023-02-13 MED ORDER — PROPOFOL 10 MG/ML IV BOLUS
INTRAVENOUS | Status: DC | PRN
  Administered 2023-02-13: 30 mg via INTRAVENOUS
  Administered 2023-02-13: 10 mg via INTRAVENOUS

## 2023-02-13 MED ORDER — LIDOCAINE HCL (PF) 1 % IJ SOLN
INTRAMUSCULAR | Status: AC
Start: 2023-02-13 — End: ?
  Filled 2023-02-13: qty 2

## 2023-02-13 MED ORDER — DEXMEDETOMIDINE HCL IN NACL 80 MCG/20ML IV SOLN
INTRAVENOUS | Status: DC | PRN
  Administered 2023-02-13: 16 ug via INTRAVENOUS

## 2023-02-13 MED ORDER — PROPOFOL 500 MG/50ML IV EMUL
INTRAVENOUS | Status: DC | PRN
  Administered 2023-02-13: 125 ug/kg/min via INTRAVENOUS

## 2023-02-13 MED ORDER — SODIUM CHLORIDE 0.9 % IV SOLN: INTRAVENOUS | Status: DC | PRN

## 2023-02-13 NOTE — Progress Notes (Signed)
Placed patient on home ventilator per home settings for the night.

## 2023-02-13 NOTE — Plan of Care (Signed)
  Problem: Education: Goal: Knowledge of General Education information will improve Description: Including pain rating scale, medication(s)/side effects and non-pharmacologic comfort measures Outcome: Progressing   Problem: Health Behavior/Discharge Planning: Goal: Ability to manage health-related needs will improve Outcome: Progressing   Problem: Clinical Measurements: Goal: Ability to maintain clinical measurements within normal limits will improve Outcome: Progressing Goal: Cardiovascular complication will be avoided Outcome: Progressing   Problem: Activity: Goal: Risk for activity intolerance will decrease Outcome: Progressing   Problem: Nutrition: Goal: Adequate nutrition will be maintained Outcome: Progressing   Problem: Elimination: Goal: Will not experience complications related to bowel motility Outcome: Progressing Goal: Will not experience complications related to urinary retention Outcome: Progressing   Problem: Pain Managment: Goal: General experience of comfort will improve Outcome: Progressing

## 2023-02-13 NOTE — Transfer of Care (Signed)
Immediate Anesthesia Transfer of Care Note  Patient: Gary [name]  Procedure(s) Performed: ESOPHAGOGASTRODUODENOSCOPY (EGD)  Patient Location: PACU  Anesthesia Type:General  Level of Consciousness: awake, alert , and oriented  Airway & Oxygen Therapy: Patient Spontanous Breathing and Patient connected to tracheostomy mask oxygen  Post-op Assessment: Report given to RN and Post -op Vital signs reviewed and stable  Post vital signs: Reviewed and stable  Last Vitals:  Vitals Value Taken Time  BP 104/68 02/13/23 1356  Temp    Pulse 80 02/13/23 1356  Resp 18 02/13/23 1356  SpO2 95 % 02/13/23 1356  Vitals shown include unfiled device data.  Last Pain:  Vitals:   02/13/23 1302  TempSrc: Temporal  PainSc:       Patients Stated Pain Goal: 0 (02/04/23 2140)  Complications: No notable events documented.
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
## 2023-02-13 NOTE — Progress Notes (Signed)
Patient slept all night on home ventilator. Took patient off home vent and placed back on 28% trach collar for the day.
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
## 2023-02-13 NOTE — Anesthesia Postprocedure Evaluation (Signed)
Anesthesia Post Note  Patient: Gary [name]  Procedure(s) Performed: ESOPHAGOGASTRODUODENOSCOPY (EGD)  Patient location during evaluation: Endoscopy Anesthesia Type: General Level of consciousness: awake and alert Pain management: pain level controlled Vital Signs Assessment: post-procedure vital signs reviewed and stable Respiratory status: spontaneous breathing, nonlabored ventilation and respiratory function stable Cardiovascular status: blood pressure returned to baseline and stable Postop Assessment: no apparent nausea or vomiting Anesthetic complications: no   No notable events documented.   Last Vitals:  Vitals:   02/13/23 1406 02/13/23 1416  BP: 110/74   Pulse:    Resp:  16  Temp:    SpO2:      Last Pain:  Vitals:   02/13/23 1416  TempSrc:   PainSc: 0-No pain                 Foye Deer

## 2023-02-13 NOTE — Progress Notes (Signed)
[facility]  - [place] at [facility]   PATIENT NAME: Gary [name]    MR#:  [id number]  DATE OF BIRTH:  03-27-1956  SUBJECTIVE:  patient seen earlier. weaned down oxygen 8 L trach collar. Pt had EGD today to eval fistula VITALS:  Blood pressure 128/81, pulse 84, temperature 97.7 F (36.5 C), resp. rate 16, height 6\' 4"  (1.93 m), weight (!) 181 kg, SpO2 93%.  PHYSICAL EXAMINATION:   GENERAL:  67 y.o.-year-old patient with no acute distress. Morbid obesity, Trach Collar+ LUNGS: distant breath sounds bilaterally, no wheezing CARDIOVASCULAR: S1, S2 normal. No murmur   ABDOMEN: Soft, nontender, nondistended.EXTREMITIES: chronic trace edema b/l.    NEUROLOGIC: nonfocal  patient is alert and awake  LABORATORY PANEL:     Chemistries  Recent Labs  Lab 02/13/23 0827  NA 137  K 3.8  CL 99  CO2 29  GLUCOSE 118*  BUN 44*  CREATININE 1.50*  CALCIUM 8.3*     Acute on chronic diastolic CHF (congestive heart failure) ([name]) Continue torsemide , spironolactone.   Creatinine stable at 1.5.   Acute on chronic respiratory failure with hypoxia and hypercapnia ([name]) Trach Dependent --Continue trach collar 6-7 L.   -- CT soft tissue of neck done on 02/08/2023 reviewed.  Tracheostomy tube is in place and there is no definite collection around the tube.  There is thinning of the wall between the esophagus and trachea with possible defect. --Case discussed with Dr. Roda Shutters, radiologist and Dr. Karna Christmas, pulmonologist, via secure chat.  --Per Dr. Roda Shutters, patient will require prone positioning for barium swallow study because suspected defect is along the anterior wall of the esophagus.  Unfortunately putting the patient in the prone position is not feasible at this time and he is also too big to fit on the table.  --s/p EGD --no esophageal deformity noted  COPD (chronic obstructive pulmonary disease) ([name]) Severe COPD on Combivent, as needed albuterol.  On yuperli nebulizer.   Acute  kidney injury superimposed on CKD ([name]) Creat stbale at 1.5 --cont torsemide and spironolactone   Anxiety On Atarax as needed.  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   Chronic colitis Continue home mesalamine   Generalized weakness Continue working with physical therapy and mobility specialist to work on getting out of the bed and ambulating.  Continue also doing exercises in the bed.   Morbid obesity with BMI of 50.0-59.9, adult ([name]) BMI 51.83   Family communication : none Consults : pulmonary CODE STATUS: full DVT Prophylaxis : Lovenox Level of care: Med-Surg Status is: Inpatient Remains inpatient appropriate because: challenging discharge!  Pt has been walking 1/2 way around the nurses station with PT     TOTAL TIME TAKING CARE OF THIS PATIENT: 25 minutes.  >50% time spent on counselling and coordination of care  Note: This dictation was prepared with Dragon dictation along with smaller phrase technology. Any transcriptional errors that result from this process are unintentional.  [name] M.D    [facility]   CC: Primary care physician; Shayne Alken, MD

## 2023-02-13 NOTE — Progress Notes (Signed)
Placed patient on home vent for the night.

## 2023-02-13 NOTE — Progress Notes (Signed)
PT Cancellation Note  Patient Details Name: ALBUS TUNNICLIFF MRN: [id number] DOB: 13-Jan-1956   Cancelled Treatment:     Pt off floor at procedure. Will continue per POC next available date/time.   Jannet Askew 02/13/2023, 4:59 PM

## 2023-02-14 ENCOUNTER — Encounter: Payer: Self-pay | Admitting: Gastroenterology

## 2023-02-14 DIAGNOSIS — I5033 Acute on chronic diastolic (congestive) heart failure: Secondary | ICD-10-CM | POA: Diagnosis not present

## 2023-02-14 NOTE — Progress Notes (Signed)
Patient slept all night on home vent. Took patient off and placed on 28% trach collar for the day.

## 2023-02-14 NOTE — Progress Notes (Signed)
Placed patient on home ventilator for the night.

## 2023-02-14 NOTE — Progress Notes (Signed)
Physical Therapy Treatment Patient Details Name: Gary [name] MRN: [id number] DOB: [birth year] Today's Date: 02/14/2023   History of Present Illness 67 y/o male presented to [facility] ED on 11/28/22 for chest pain x 4 days. Admitted for acute on chronic CHF. Frequent admissions this year with most recent dsicharge on 11/21/22. PMH includes obesity, hypoxia, respiratory failure with tracheostomy, COPD, GIB, HFpEF.    PT Comments   Pt was supine in bed upon arrival. Willing to participate however requested to have BM first. Pt was unwilling to attempt use of bariatric BSC but did independently wipe himself after BM in bedpan. He was then agreeable to OOB activity. Remains unwilling to stand from standard height surfaces but is able to stand with supervision. Pt uses momentum to achieve sitting EOB or standing but once in standing is able to ambulate ~ 120 ft with w/c follow. Pt on 6 L trach collar throughout all OOB activity. Gary [name] used bariatric w/c to self propel using BLEs, as able, prior to going outside for a few minutes. Overall pt will continue to benefit from PT to maximize independence while decreasing caregiver burden.   If plan is discharge home, recommend the following: A lot of help with bathing/dressing/bathroom;Assist for transportation;A lot of help with walking and/or transfers;A little help with walking and/or transfers     Equipment Recommendations  Other (comment) (Need to confirm pt's home equipment is present and working prior to Dc)       Precautions / Restrictions Precautions Precautions: Fall Precaution Comments: trach collar/ Trach Restrictions Weight Bearing Restrictions: No     Mobility  Bed Mobility Overal bed mobility: Needs Assistance Bed Mobility: Supine to Sit  Supine to sit: Supervision General bed mobility comments: pt uses momentum to achieve EOB short sit. prolonged sitting EOB prior to transfers. pt demands patience.    Transfers Overall transfer  level: Needs assistance Equipment used: Rolling walker (2 wheels) Transfers: Sit to/from Stand Sit to Stand: From elevated surface, Supervision  General transfer comment: pt continues to only be willing to stand from elevated bed heights. uses momentum to achieve standing to Bariatric RW. on 6L trach collar throughout OOB activity.    Ambulation/Gait Ambulation/Gait assistance: Contact guard assist Gait Distance (Feet): 120 Feet Assistive device: Rolling walker (2 wheels) Gait Pattern/deviations: Step-through pattern Gait velocity: decreased  General Gait Details: Pt was able to ambulate a little over 3/4 a lap with chair follow and 2 short standing rest. distance limiuted by arm fatigue. pt requested to go outside while seated in w/c. pt unable to flex LEs to place on bariatric w/c legrest. He is able to self propel w/c using feet with author encouraging pt get OOB to w/c or recliner more frequently      Balance Overall balance assessment: Needs assistance Sitting-balance support: Feet supported, No upper extremity supported Sitting balance-[name] Scale: Good     Standing balance support: Bilateral upper extremity supported Standing balance-[name] Scale: Fair Standing balance comment: Reliant on RW/AD for all standing activity.       Cognition Arousal: Alert Behavior During Therapy: WFL for tasks assessed/performed Overall Cognitive Status: Within Functional Limits for tasks assessed      General Comments: Pt is A and O x 4           General Comments General comments (skin integrity, edema, etc.): used w/c to have pt self propel with LEs for strengthening. pt was very appreciative to go outside for a few minutes.  Pertinent Vitals/Pain Pain Assessment Pain Assessment: No/denies pain     PT Goals (current goals can now be found in the care plan section) Acute Rehab PT Goals Patient Stated Goal: Get better so I can get out of here." Progress towards PT goals: Not  progressing toward goals - comment    Frequency    Min 1X/week      PT Plan Current plan remains appropriate    Co-evaluation     PT goals addressed during session: Mobility/safety with mobility;Proper use of DME;Strengthening/ROM;Balance        AM-PAC PT "6 Clicks" Mobility   Outcome Measure  Help needed turning from your back to your side while in a flat bed without using bedrails?: None Help needed moving from lying on your back to sitting on the side of a flat bed without using bedrails?: None Help needed moving to and from a bed to a chair (including a wheelchair)?: A Lot Help needed standing up from a chair using your arms (e.g., wheelchair or bedside chair)?: A Lot Help needed to walk in hospital room?: A Lot Help needed climbing 3-5 steps with a railing? : Total 6 Click Score: 15    End of Session Equipment Utilized During Treatment: Oxygen (6 L trach collar) Activity Tolerance: Patient tolerated treatment well;Patient limited by fatigue Patient left: in chair;with call bell/phone within reach Nurse Communication: Mobility status PT Visit Diagnosis: Muscle weakness (generalized) (M62.81);Difficulty in walking, not elsewhere classified (R26.2)     Time: 9562-1308 PT Time Calculation (min) (ACUTE ONLY): 40 min  Charges:    $Gait Training: 8-22 mins PT General Charges $$ ACUTE PT VISIT: 1 Visit                     Jetta Lout PTA 02/14/23, 5:17 PM
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
## 2023-02-14 NOTE — Progress Notes (Signed)
[facility]  - Home at [facility]   PATIENT NAME: Gary [name]    MR#:  [id number]  DATE OF BIRTH:  December 20, 1955  SUBJECTIVE:  patient seen earlier. weaned down oxygen via trach collar. Ambulated with PT in the hallways VITALS:  Blood pressure 117/78, pulse 82, temperature 98.5 F (36.9 C), temperature source Oral, resp. rate 19, height 6\' 4"  (1.93 m), weight (!) 181 kg, SpO2 97%.  PHYSICAL EXAMINATION:   GENERAL:  67 y.o.-year-old patient with no acute distress. Morbid obesity, Trach Collar+ LUNGS: distant breath sounds bilaterally, no wheezing CARDIOVASCULAR: S1, S2 normal. No murmur   EXTREMITIES: chronic trace edema b/l.    NEUROLOGIC: nonfocal  patient is alert and awake  LABORATORY PANEL:     Chemistries  Recent Labs  Lab 02/13/23 0827  NA 137  K 3.8  CL 99  CO2 29  GLUCOSE 118*  BUN 44*  CREATININE 1.50*  CALCIUM 8.3*     Acute on chronic diastolic CHF (congestive heart failure) ([name]) Continue torsemide , spironolactone.   Creatinine stable at 1.5.   Acute on chronic respiratory failure with hypoxia and hypercapnia ([name]) Trach Dependent --Continue trach collar 6-7 L.   -- CT soft tissue of neck done on 02/08/2023 reviewed.  Tracheostomy tube is in place and there is no definite collection around the tube.  There is thinning of the wall between the esophagus and trachea with possible defect. --Case discussed with Dr. Roda Shutters, radiologist and Dr. Karna Christmas, pulmonologist, via secure chat.  --Per Dr. Roda Shutters, patient will require prone positioning for barium swallow study because suspected defect is along the anterior wall of the esophagus.  Unfortunately putting the patient in the prone position is not feasible at this time and he is also too big to fit on the table.  --s/p EGD --no esophageal deformity noted  COPD (chronic obstructive pulmonary disease) ([name]) Severe COPD on Combivent, as needed albuterol.  On yuperli nebulizer.   Acute kidney  injury superimposed on CKD ([name]) Creat stbale at 1.5 --cont torsemide and spironolactone   Anxiety On Atarax as needed.  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   Chronic colitis Continue home mesalamine   Generalized weakness Continue working with physical therapy and mobility specialist to work on getting out of the bed and ambulating.  Continue also doing exercises in the bed.   Morbid obesity with BMI of 50.0-59.9, adult ([name]) BMI 51.83   Family communication : none Consults : pulmonary CODE STATUS: full DVT Prophylaxis : Lovenox Level of care: Med-Surg Status is: Inpatient Remains inpatient appropriate because: challenging discharge!  Pt has been walking 1/2 way around the nurses station with PT     TOTAL TIME TAKING CARE OF THIS PATIENT: 25 minutes.  >50% time spent on counselling and coordination of care  Note: This dictation was prepared with Dragon dictation along with smaller phrase technology. Any transcriptional errors that result from this process are unintentional.  [name] M.D    [facility]   CC: Primary care physician; Shayne Alken, MD

## 2023-02-14 NOTE — Plan of Care (Signed)
  Problem: Education: Goal: Knowledge of General Education information will improve Description: Including pain rating scale, medication(s)/side effects and non-pharmacologic comfort measures Outcome: Progressing   Problem: Health Behavior/Discharge Planning: Goal: Ability to manage health-related needs will improve Outcome: Progressing   Problem: Clinical Measurements: Goal: Ability to maintain clinical measurements within normal limits will improve Outcome: Progressing Goal: Cardiovascular complication will be avoided Outcome: Progressing   Problem: Activity: Goal: Risk for activity intolerance will decrease Outcome: Progressing   Problem: Nutrition: Goal: Adequate nutrition will be maintained Outcome: Progressing   Problem: Coping: Goal: Level of anxiety will decrease Outcome: Progressing   Problem: Elimination: Goal: Will not experience complications related to bowel motility Outcome: Progressing Goal: Will not experience complications related to urinary retention Outcome: Progressing

## 2023-02-15 DIAGNOSIS — I5033 Acute on chronic diastolic (congestive) heart failure: Secondary | ICD-10-CM | POA: Diagnosis not present

## 2023-02-15 MED ORDER — TORSEMIDE 20 MG PO TABS
20.0000 mg | ORAL_TABLET | Freq: Every day | ORAL | Status: DC
  Administered 2023-02-16: 20 mg via ORAL
  Filled 2023-02-15: qty 1

## 2023-02-15 NOTE — Progress Notes (Signed)
Mobility Specialist - Progress Note   02/15/23 1500  Mobility  Activity Ambulated with assistance in hallway  Level of Assistance Standby assist, set-up cues, supervision of patient - no hands on  Assistive Device Front wheel walker  Distance Ambulated (ft) 120 ft  Activity Response Tolerated well  $Mobility charge 1 Mobility     Pt ambulated ~120' in hallway with supervision. STS from significantly elevated bed height. Voiced c/o soreness in BUE during ambulation, limiting further distance. Seated rest break and taken back to room. Hoyer transfer chair-bed.    Gary [name] Mobility Specialist 02/15/23, 3:20 PM

## 2023-02-15 NOTE — Plan of Care (Signed)

## 2023-02-15 NOTE — Plan of Care (Signed)
  Problem: Education: Goal: Knowledge of General Education information will improve Description: Including pain rating scale, medication(s)/side effects and non-pharmacologic comfort measures Outcome: Progressing   Problem: Health Behavior/Discharge Planning: Goal: Ability to manage health-related needs will improve Outcome: Progressing   Problem: Clinical Measurements: Goal: Will remain free from infection Outcome: Progressing Goal: Respiratory complications will improve Outcome: Progressing Goal: Cardiovascular complication will be avoided Outcome: Progressing   Problem: Activity: Goal: Risk for activity intolerance will decrease Outcome: Progressing   Problem: Nutrition: Goal: Adequate nutrition will be maintained Outcome: Progressing   Problem: Elimination: Goal: Will not experience complications related to bowel motility Outcome: Progressing Goal: Will not experience complications related to urinary retention Outcome: Progressing

## 2023-02-15 NOTE — TOC Progression Note (Signed)
Transition of Care [facility]) - Progression Note    Patient Details  Name: Gary [name] MRN: [id number] Date of Birth: Sep 02, 1955  Transition of Care [facility]) CM/SW Contact  Marlowe Sax, RN Phone Number: 02/15/2023, 1:20 PM  Clinical Narrative:    Coralyn Pear out to [place] at [facility] to see if they have had him in the past and if they will take him for Home health She stated that they could possibly accept him but he would have to be willing, I went into the room to see the patient and discuss DC planning He stated that he is not medically ready to leave, he stated that he is not able to get out of bed with out Max assist He feels that he needs to be able to get out of bed and go to the bathroom and into the kitchen without assistance before he is ready to DC, he stated that a physician told him that he is not ready ready, when asked which physician told him that he stated that "Dr A" did  He is on 5 liters of O2 He stated that at home he will not have a nurse and will not have a CNA to help him  I explained I may be able to get him a home health agency, and he argued that they are not there daily I notified the physician    Expected Discharge Plan: Home w Home Health Services Barriers to Discharge: Continued Medical Work up  Expected Discharge Plan and Services     Post Acute Care Choice: Home Health                   DME Arranged: Bedside commode DME Agency: [employer] Date DME Agency Contacted: 01/12/23 Time DME Agency Contacted: 1427 Representative spoke with at DME Agency: [facility] Arranged: PT, OT HH Agency: [facility] Date [facility] Agency Contacted: 01/12/23 Time HH Agency Contacted: 1428 Representative spoke with at [facility] Agency: Kandee Keen   Social Determinants of Health (SDOH) Interventions SDOH Screenings   Food Insecurity: No Food Insecurity (12/01/2022)  Housing: Patient Declined (12/01/2022)  Transportation Needs: No Transportation Needs (12/01/2022)   Utilities: Not At Risk (12/01/2022)  Depression (PHQ2-9): Low Risk  (06/07/2021)  Financial Resource Strain: High Risk (11/21/2022)   Received from [facility]  Physical Activity: Insufficiently Active (05/03/2017)  Social Connections: Patient Declined (11/20/2022)   Received from [facility]  Stress: Stress Concern Present (11/20/2022)   Received from [facility]  Tobacco Use: Medium Risk (02/13/2023)    Readmission Risk Interventions    11/05/2022    2:59 PM 11/05/2022   12:16 PM 12/31/2021    8:58 AM  Readmission Risk Prevention Plan  Transportation Screening Complete Complete Complete  Medication Review Oceanographer) Complete Complete Complete  PCP or Specialist appointment within 3-5 days of discharge Complete Complete Complete  HRI or Home Care Consult Complete Complete Complete  SW Recovery Care/Counseling Consult Complete Complete Complete  Palliative Care Screening Complete Complete Complete  Skilled Nursing Facility Not Applicable Not Applicable Not Applicable

## 2023-02-15 NOTE — Progress Notes (Signed)
Placed patient on home trilogy, no complications noted.

## 2023-02-15 NOTE — Progress Notes (Signed)
Patient slept on home ventilator all night. Took patient off vent and placed on 28% trach collar for the day.
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
## 2023-02-15 NOTE — Progress Notes (Signed)
[facility]  - [place] at [facility]   PATIENT NAME: Gary [name]    MR#:  [id number]  DATE OF BIRTH:  02-Aug-1955  SUBJECTIVE:  patient seen earlier. weaned down oxygen via trach collar--down to 5L Ambulates with PT in the hallways VITALS:  Blood pressure 109/73, pulse 75, temperature 98.1 F (36.7 C), temperature source Oral, resp. rate 17, height 6\' 4"  (1.93 m), weight (!) 181 kg, SpO2 97%.  PHYSICAL EXAMINATION:   GENERAL:  67 y.o.-year-old patient with no acute distress. Morbid obesity, Trach Collar+ LUNGS: distant breath sounds bilaterally, no wheezing CARDIOVASCULAR: S1, S2 normal. No murmur   EXTREMITIES: chronic trace edema b/l.    NEUROLOGIC: nonfocal  patient is alert and awake  LABORATORY PANEL:     Chemistries  Recent Labs  Lab 02/13/23 0827  NA 137  K 3.8  CL 99  CO2 29  GLUCOSE 118*  BUN 44*  CREATININE 1.50*  CALCIUM 8.3*     Acute on chronic diastolic CHF (congestive heart failure) ([name]) Continue torsemide , spironolactone.   Creatinine stable at 1.5.   Acute on chronic respiratory failure with hypoxia and hypercapnia ([name]) Trach Dependent --Continue trach collar 6-7 L.   -- CT soft tissue of neck done on 02/08/2023 reviewed.  Tracheostomy tube is in place and there is no definite collection around the tube.  There is thinning of the wall between the esophagus and trachea with possible defect. --Case discussed with Dr. Roda Shutters, radiologist and Dr. Karna Christmas, pulmonologist, via secure chat.  --Per Dr. Roda Shutters, patient will require prone positioning for barium swallow study because suspected defect is along the anterior wall of the esophagus.  Unfortunately putting the patient in the prone position is not feasible at this time and he is also too big to fit on the table.  --s/p EGD --no esophageal deformity noted  COPD (chronic obstructive pulmonary disease) ([name]) Severe COPD on Combivent, as needed albuterol.  On yuperli nebulizer.   Acute  kidney injury superimposed on CKD ([name]) Creat stbale at 1.5 --cont torsemide and spironolactone   Anxiety On Atarax as needed.  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   Chronic colitis Continue home mesalamine   Generalized weakness Continue working with physical therapy and mobility specialist to work on getting out of the bed and ambulating.  Continue also doing exercises in the bed.   Morbid obesity with BMI of 50.0-59.9, adult ([name]) BMI 51.83   Family communication : none Consults : pulmonary CODE STATUS: full DVT Prophylaxis : Lovenox Level of care: Med-Surg Status is: Inpatient Remains inpatient appropriate because: TOC for d/c planning  Pt has been walking 1/2 way around the nurses station with PT     TOTAL TIME TAKING CARE OF THIS PATIENT: 25 minutes.  >50% time spent on counselling and coordination of care  Note: This dictation was prepared with Dragon dictation along with smaller phrase technology. Any transcriptional errors that result from this process are unintentional.  [name] M.D    [facility]   CC: Primary care physician; Shayne Alken, MD

## 2023-02-16 DIAGNOSIS — I5033 Acute on chronic diastolic (congestive) heart failure: Secondary | ICD-10-CM | POA: Diagnosis not present

## 2023-02-16 MED ORDER — TORSEMIDE 20 MG PO TABS
40.0000 mg | ORAL_TABLET | Freq: Every day | ORAL | Status: DC
  Administered 2023-02-17 – 2023-02-22 (×6): 40 mg via ORAL
  Filled 2023-02-16 (×6): qty 2

## 2023-02-16 NOTE — Progress Notes (Signed)
Attempted to get patient's weight and the bed scale read 2.9 kgs.

## 2023-02-16 NOTE — Plan of Care (Signed)

## 2023-02-16 NOTE — Progress Notes (Signed)
[facility]  - [place] at [facility]   PATIENT NAME: Gary [name]    MR#:  [id number]  DATE OF BIRTH:  1955-11-01  SUBJECTIVE:  patient seen earlier. weaned down oxygen via trach collar--down to 5L Ambulates with PT in the hallways VITALS:  Blood pressure (!) 121/90, pulse 77, temperature 97.9 F (36.6 C), resp. rate 14, height 6\' 4"  (1.93 m), weight (!) 181 kg, SpO2 91%.  PHYSICAL EXAMINATION:   GENERAL:  67 y.o.-year-old patient with no acute distress. Morbid obesity,  Trach Collar+ LUNGS: distant breath sounds bilaterally, no wheezing CARDIOVASCULAR: S1, S2 normal. No murmur   EXTREMITIES: chronic trace edema b/l.    NEUROLOGIC: nonfocal  patient is alert and awake  LABORATORY PANEL:     Chemistries  Recent Labs  Lab 02/13/23 0827  NA 137  K 3.8  CL 99  CO2 29  GLUCOSE 118*  BUN 44*  CREATININE 1.50*  CALCIUM 8.3*     Acute on chronic diastolic CHF (congestive heart failure) ([name]) Continue torsemide , spironolactone.   Creatinine stable at 1.5.   Acute on chronic respiratory failure with hypoxia and hypercapnia ([name]) Trach Dependent --Continue trach collar 6-7 L.   -- CT soft tissue of neck done on 02/08/2023 reviewed.  Tracheostomy tube is in place and there is no definite collection around the tube.  There is thinning of the wall between the esophagus and trachea with possible defect. --Case discussed with Dr. Roda Shutters, radiologist and Dr. Karna Christmas, pulmonologist, via secure chat.  --Per Dr. Roda Shutters, patient will require prone positioning for barium swallow study because suspected defect is along the anterior wall of the esophagus.  Unfortunately putting the patient in the prone position is not feasible at this time and he is also too big to fit on the table.  --s/p EGD --no esophageal deformity noted  COPD (chronic obstructive pulmonary disease) ([name]) Severe COPD on Combivent, as needed albuterol.  On yuperli nebulizer.   Acute kidney injury  superimposed on CKD ([name]) Creat stbale at 1.5 --cont torsemide and spironolactone   Anxiety On Atarax as needed.  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   Chronic colitis Continue home mesalamine   Generalized weakness Continue working with physical therapy and mobility specialist to work on getting out of the bed and ambulating.  Continue also doing exercises in the bed.   Morbid obesity with BMI of 50.0-59.9, adult ([name]) BMI 51.83   Family communication : none Consults : pulmonary CODE STATUS: full DVT Prophylaxis : Lovenox Level of care: Med-Surg Status is: Inpatient Remains inpatient appropriate because: TOC for d/c planning  Pt has been walking 1/2 way around the nurses station with PT     TOTAL TIME TAKING CARE OF THIS PATIENT: 25 minutes.  >50% time spent on counselling and coordination of care  Note: This dictation was prepared with Dragon dictation along with smaller phrase technology. Any transcriptional errors that result from this process are unintentional.  [name] M.D    [facility]   CC: Primary care physician; Shayne Alken, MD
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
## 2023-02-16 NOTE — Progress Notes (Signed)
Tried multiple times to complete a physical assessment on patient. He refused and told me to leave his room. 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

## 2023-02-16 NOTE — Progress Notes (Signed)
Placed patient back on trach collar 5L 28%, no complications noted.

## 2023-02-16 NOTE — Progress Notes (Signed)
Placed patient on home trilogy, no complications noted.

## 2023-02-16 NOTE — Plan of Care (Signed)
  Problem: Education: Goal: Knowledge of General Education information will improve Description Including pain rating scale, medication(s)/side effects and non-pharmacologic comfort measures Outcome: Progressing   Problem: Health Behavior/Discharge Planning: Goal: Ability to manage health-related needs will improve Outcome: Progressing   

## 2023-02-17 ENCOUNTER — Inpatient Hospital Stay: Payer: Medicare HMO

## 2023-02-17 DIAGNOSIS — I5033 Acute on chronic diastolic (congestive) heart failure: Secondary | ICD-10-CM | POA: Diagnosis not present

## 2023-02-17 NOTE — Progress Notes (Signed)
Placed patient on home trilogy
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
## 2023-02-17 NOTE — Plan of Care (Signed)

## 2023-02-17 NOTE — Progress Notes (Signed)
[facility]  - [place] [facility]   PATIENT NAME: Gary [name]    MR#:  [id number]  DATE OF BIRTH:  Jun 14, 1955  SUBJECTIVE:  patient seen earlier. weaned down oxygen via trach collar--down to 5L Ambulates with PT in the hallways Dr A--placed orders for ambulating TID VITALS:  Blood pressure 111/75, pulse 78, temperature 97.9 F (36.6 C), temperature source Oral, resp. rate 14, height 6\' 4"  (1.93 m), weight (!) 181 kg, SpO2 94%.  PHYSICAL EXAMINATION:   GENERAL:  67 y.o.-year-old patient with no acute distress. Morbid obesity,  Trach Collar+ LUNGS: distant breath sounds bilaterally, no wheezing CARDIOVASCULAR: S1, S2 normal. No murmur   EXTREMITIES: chronic trace edema b/l.    NEUROLOGIC: nonfocal  patient is alert and awake  LABORATORY PANEL:     Chemistries  Recent Labs  Lab 02/13/23 0827  NA 137  K 3.8  CL 99  CO2 29  GLUCOSE 118*  BUN 44*  CREATININE 1.50*  CALCIUM 8.3*     Acute on chronic diastolic CHF (congestive heart failure) ([name]) Continue torsemide , spironolactone.   Creatinine stable at 1.5.   Acute on chronic respiratory failure with hypoxia and hypercapnia ([name]) Trach Dependent --Continue trach collar 6-7 L.   -- CT soft tissue of neck done on 02/08/2023 reviewed.  Tracheostomy tube is in place and there is no definite collection around the tube.  There is thinning of the wall between the esophagus and trachea with possible defect. --Case discussed with Dr. Roda Shutters, radiologist and Dr. Karna Christmas, pulmonologist, via secure chat.  --Per Dr. Roda Shutters, patient will require prone positioning for barium swallow study because suspected defect is along the anterior wall of the esophagus.  Unfortunately putting the patient in the prone position is not feasible at this time and he is also too big to fit on the table.  --s/p EGD --no esophageal deformity noted  COPD (chronic obstructive pulmonary disease) ([name]) Severe COPD on Combivent, as needed  albuterol.  On yuperli nebulizer.   Acute kidney injury superimposed on CKD ([name]) Creat stbale at 1.5 --cont torsemide and spironolactone   Anxiety On Atarax as needed.  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   Chronic colitis Continue home mesalamine   Generalized weakness Continue working with physical therapy and mobility specialist to work on getting out of the bed and ambulating.  Continue also doing exercises in the bed.   Morbid obesity with BMI of 50.0-59.9, adult ([name]) BMI 51.83   Family communication : none Consults : pulmonary CODE STATUS: full DVT Prophylaxis : Lovenox Level of care: Med-Surg Status is: Inpatient Remains inpatient appropriate because: TOC for d/c planning  Pt has been walking 1/2 way around the nurses station with PT     TOTAL TIME TAKING CARE OF THIS PATIENT: 25 minutes.  >50% time spent on counselling and coordination of care  Note: This dictation was prepared with Dragon dictation along with smaller phrase technology. Any transcriptional errors that result from this process are unintentional.  [name] M.D    [facility]   CC: Primary care physician; Shayne Alken, MD

## 2023-02-18 DIAGNOSIS — I5033 Acute on chronic diastolic (congestive) heart failure: Secondary | ICD-10-CM | POA: Diagnosis not present

## 2023-02-18 LAB — CBC WITH DIFFERENTIAL/PLATELET
Abs Immature Granulocytes: 0.03 10*3/uL (ref 0.00–0.07)
Basophils Absolute: 0 10*3/uL (ref 0.0–0.1)
Basophils Relative: 1 %
Eosinophils Absolute: 0.2 10*3/uL (ref 0.0–0.5)
Eosinophils Relative: 3 %
HCT: 40.7 % (ref 39.0–52.0)
Hemoglobin: 11 g/dL — ABNORMAL LOW (ref 13.0–17.0)
Immature Granulocytes: 0 %
Lymphocytes Relative: 18 %
Lymphs Abs: 1.4 10*3/uL (ref 0.7–4.0)
MCH: 21.3 pg — ABNORMAL LOW (ref 26.0–34.0)
MCHC: 27 g/dL — ABNORMAL LOW (ref 30.0–36.0)
MCV: 78.7 fL — ABNORMAL LOW (ref 80.0–100.0)
Monocytes Absolute: 0.8 10*3/uL (ref 0.1–1.0)
Monocytes Relative: 10 %
Neutro Abs: 5.4 10*3/uL (ref 1.7–7.7)
Neutrophils Relative %: 68 %
Platelets: 190 10*3/uL (ref 150–400)
RBC: 5.17 MIL/uL (ref 4.22–5.81)
RDW: 18.5 % — ABNORMAL HIGH (ref 11.5–15.5)
WBC: 7.9 10*3/uL (ref 4.0–10.5)
nRBC: 0 % (ref 0.0–0.2)

## 2023-02-18 NOTE — Progress Notes (Signed)
Mobility Specialist - Progress Note   02/18/23 1600  Mobility  Activity Transferred from chair to bed  Level of Assistance +2 (takes two people)  Assistive Device Sling  $Mobility charge 1 Mobility     Pt transferred chair-bed via sling.    Filiberto Pinks Mobility Specialist 02/18/23, 4:30 PM

## 2023-02-18 NOTE — Progress Notes (Signed)
Physical Therapy Treatment Patient Details Name: Gary [name] MRN: [id number] DOB: [birth year] Today's Date: 02/18/2023   History of Present Illness 67 y/o male presented to [facility] ED on 11/28/22 for chest pain x 4 days. Admitted for acute on chronic CHF. Frequent admissions this year with most recent dsicharge on 11/21/22. PMH includes obesity, hypoxia, respiratory failure with tracheostomy, COPD, GIB, HFpEF.    PT Comments  Pt was willing to participate during the session and put forth good effort. Supervision for bed mobility, and  he continues to need heightened bed height to perform STS's with supervision and BRW. Once up he was able to walk ~100 feet with BRW and CGA with a chair follow. Pt reports not being able to walk as far as prior session due to his legs feeling swollen and weaker than usual today, pt kept on 6L via trach collar throughout mobility today. Pt goals reviewed and updated. Pt will benefit from continued PT services upon discharge to safely address deficits listed in patient problem list for decreased caregiver assistance and eventual return to PLOF.      If plan is discharge home, recommend the following: A lot of help with bathing/dressing/bathroom;Assist for transportation;A lot of help with walking and/or transfers;A little help with walking and/or transfers   Can travel by private vehicle        Equipment Recommendations  Other (comment) (Need to confirm pt's home equipment is present and working prior to Dc)    Recommendations for Other Services       Precautions / Restrictions Precautions Precautions: Fall Precaution Comments: trach collar/ Trach Restrictions Weight Bearing Restrictions: No     Mobility  Bed Mobility Overal bed mobility: Needs Assistance Bed Mobility: Supine to Sit     Supine to sit: Supervision     General bed mobility comments: Able to pull self to EOB, prolonged sitting EOB for amb prep, needing elevated bed hiehgt for perofrm STS  at supervision    Transfers Overall transfer level: Needs assistance Equipment used: Rolling walker (2 wheels) Transfers: Sit to/from Stand Sit to Stand: From elevated surface, Supervision           General transfer comment: Pt continues to requiest heightened bed surface to stand form. On 6L trach collar throughout amb activity    Ambulation/Gait Ambulation/Gait assistance: Contact guard assist Gait Distance (Feet): 100 Feet Assistive device: Rolling walker (2 wheels) Gait Pattern/deviations: Step-through pattern Gait velocity: decreased     General Gait Details: Able to amb ~half a lap with chair follow,  distance limited due to pt's legs getting tired, pt reports feeling weakers since he hasnt moved in a few days.   Stairs             Wheelchair Mobility     Tilt Bed    Modified Rankin (Stroke Patients Only)       Balance Overall balance assessment: Needs assistance Sitting-balance support: Feet supported, No upper extremity supported Sitting balance-[name] Scale: Good     Standing balance support: Bilateral upper extremity supported Standing balance-[name] Scale: Fair Standing balance comment: Reliant on RW/AD for all standing activity.                            Cognition Arousal: Alert Behavior During Therapy: WFL for tasks assessed/performed Overall Cognitive Status: Within Functional Limits for tasks assessed  Exercises   Continued education on benefits of frequent mobility in order to maintain functional strength    General Comments        Pertinent Vitals/Pain Pain Assessment Pain Assessment: No/denies pain    Home Living                          Prior Function            PT Goals (current goals can now be found in the care plan section) Progress towards PT goals: Not progressing toward goals - comment    Frequency    Min 1X/week      PT  Plan      Co-evaluation              AM-PAC PT "6 Clicks" Mobility   Outcome Measure  Help needed turning from your back to your side while in a flat bed without using bedrails?: None Help needed moving from lying on your back to sitting on the side of a flat bed without using bedrails?: None Help needed moving to and from a bed to a chair (including a wheelchair)?: A Lot Help needed standing up from a chair using your arms (e.g., wheelchair or bedside chair)?: A Lot Help needed to walk in hospital room?: A Lot Help needed climbing 3-5 steps with a railing? : Total 6 Click Score: 15    End of Session Equipment Utilized During Treatment: Oxygen (6L) Activity Tolerance: Patient tolerated treatment well;Patient limited by fatigue Patient left: in chair;with call bell/phone within reach Nurse Communication: Mobility status PT Visit Diagnosis: Muscle weakness (generalized) (M62.81);Difficulty in walking, not elsewhere classified (R26.2)     Time: 5573-2202 PT Time Calculation (min) (ACUTE ONLY): 27 min  Charges:                            Cecile Sheerer, SPT 02/18/23, 4:12 PM
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
## 2023-02-18 NOTE — Plan of Care (Signed)

## 2023-02-18 NOTE — Progress Notes (Signed)
[facility]  - [place] at [facility]   PATIENT NAME: Gary [name]    MR#:  [id number]  DATE OF BIRTH:  [birth year]  SUBJECTIVE:  patient seen earlier. weaned down oxygen via trach collar--down to 5L Ambulates with PT in the hallways Dr A--placed orders for ambulating TID VITALS:  Blood pressure 110/79, pulse 78, temperature 97.9 F (36.6 C), resp. rate 18, height 6\' 4"  (1.93 m), weight (!) 181 kg, SpO2 91%.  PHYSICAL EXAMINATION:   GENERAL:  67 y.o.-year-old patient with no acute distress. Morbid obesity,  Trach Collar+ LUNGS: distant breath sounds bilaterally, no wheezing CARDIOVASCULAR: S1, S2 normal. No murmur   EXTREMITIES: chronic trace edema b/l.    NEUROLOGIC: nonfocal  patient is alert and awake  LABORATORY PANEL:     Chemistries  Recent Labs  Lab 02/13/23 0827  NA 137  K 3.8  CL 99  CO2 29  GLUCOSE 118*  BUN 44*  CREATININE 1.50*  CALCIUM 8.3*     Acute on chronic diastolic CHF (congestive heart failure) ([name]) Continue torsemide , spironolactone.   Creatinine stable at 1.5.   Acute on chronic respiratory failure with hypoxia and hypercapnia ([name]) Trach Dependent --Continue trach collar 6-7 L.   -- CT soft tissue of neck done on 02/08/2023 reviewed.  Tracheostomy tube is in place and there is no definite collection around the tube.  There is thinning of the wall between the esophagus and trachea with possible defect. --Case discussed with Dr. Roda Shutters, radiologist and Dr. Karna Christmas, pulmonologist, via secure chat.  --Per Dr. Roda Shutters, patient will require prone positioning for barium swallow study because suspected defect is along the anterior wall of the esophagus.  Unfortunately putting the patient in the prone position is not feasible at this time and he is also too big to fit on the table.  --s/p EGD --no esophageal deformity noted  COPD (chronic obstructive pulmonary disease) ([name]) Severe COPD on Combivent, as needed albuterol.  On yuperli  nebulizer.   Acute kidney injury superimposed on CKD ([name]) Creat stbale at 1.5 --cont torsemide and spironolactone   Anxiety On Atarax as needed.  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   Chronic colitis Continue home mesalamine   Generalized weakness Continue working with physical therapy and mobility specialist to work on getting out of the bed and ambulating.  Continue also doing exercises in the bed.   Morbid obesity with BMI of 50.0-59.9, adult ([name]) BMI 51.83   Family communication : none Consults : pulmonary CODE STATUS: full DVT Prophylaxis : Lovenox Level of care: Med-Surg Status is: Inpatient Remains inpatient appropriate because: TOC for d/c planning  Pt has been walking 1/2 way around the nurses station with PT     TOTAL TIME TAKING CARE OF THIS PATIENT: 25 minutes.  >50% time spent on counselling and coordination of care  Note: This dictation was prepared with Dragon dictation along with smaller phrase technology. Any transcriptional errors that result from this process are unintentional.  [name] M.D    [facility]   CC: Primary care physician; Shayne Alken, MD

## 2023-02-19 DIAGNOSIS — I5033 Acute on chronic diastolic (congestive) heart failure: Secondary | ICD-10-CM | POA: Diagnosis not present

## 2023-02-19 LAB — BASIC METABOLIC PANEL
Anion gap: 7 (ref 5–15)
BUN: 29 mg/dL — ABNORMAL HIGH (ref 8–23)
CO2: 28 mmol/L (ref 22–32)
Calcium: 8.6 mg/dL — ABNORMAL LOW (ref 8.9–10.3)
Chloride: 100 mmol/L (ref 98–111)
Creatinine, Ser: 1.4 mg/dL — ABNORMAL HIGH (ref 0.61–1.24)
GFR, Estimated: 56 mL/min — ABNORMAL LOW (ref >60)
Glucose, Bld: 121 mg/dL — ABNORMAL HIGH (ref 70–99)
Potassium: 3.7 mmol/L (ref 3.5–5.1)
Sodium: 135 mmol/L (ref 135–145)

## 2023-02-19 LAB — CBC
HCT: 39.8 % (ref 39.0–52.0)
Hemoglobin: 11 g/dL — ABNORMAL LOW (ref 13.0–17.0)
MCH: 21 pg — ABNORMAL LOW (ref 26.0–34.0)
MCHC: 27.6 g/dL — ABNORMAL LOW (ref 30.0–36.0)
MCV: 75.8 fL — ABNORMAL LOW (ref 80.0–100.0)
Platelets: 184 10*3/uL (ref 150–400)
RBC: 5.25 MIL/uL (ref 4.22–5.81)
RDW: 18.5 % — ABNORMAL HIGH (ref 11.5–15.5)
WBC: 8.2 10*3/uL (ref 4.0–10.5)
nRBC: 0 % (ref 0.0–0.2)

## 2023-02-19 NOTE — Progress Notes (Signed)
[facility]  - [place] at [facility]   PATIENT NAME: Gary [name]    MR#:  [id number]  DATE OF BIRTH:  16-Jul-1955  SUBJECTIVE:  patient seen earlier. weaned down oxygen via trach collar--down to 5L Ambulates with PT in the hallways Dr A--placed orders for ambulating TID VITALS:  Blood pressure 137/86, pulse 78, temperature 98 F (36.7 C), resp. rate 20, height 6\' 4"  (1.93 m), weight (!) 181 kg, SpO2 98%.  PHYSICAL EXAMINATION:   GENERAL:  67 y.o.-year-old patient with no acute distress. Morbid obesity,  Trach Collar+ LUNGS: distant breath sounds bilaterally, no wheezing CARDIOVASCULAR: S1, S2 normal. No murmur   EXTREMITIES: chronic trace edema b/l.    NEUROLOGIC: nonfocal  patient is alert and awake  LABORATORY PANEL:     Chemistries  Recent Labs  Lab 02/19/23 0906  NA 135  K 3.7  CL 100  CO2 28  GLUCOSE 121*  BUN 29*  CREATININE 1.40*  CALCIUM 8.6*     Acute on chronic diastolic CHF (congestive heart failure) ([name]) Continue torsemide , spironolactone.   Creatinine stable at 1.5.   Acute on chronic respiratory failure with hypoxia and hypercapnia ([name]) Trach Dependent --Continue trach collar 6-7 L.   -- CT soft tissue of neck done on 02/08/2023 reviewed.  Tracheostomy tube is in place and there is no definite collection around the tube.  There is thinning of the wall between the esophagus and trachea with possible defect. --Case discussed with Dr. Roda Shutters, radiologist and Dr. Karna Christmas, pulmonologist, via secure chat.  --Per Dr. Roda Shutters, patient will require prone positioning for barium swallow study because suspected defect is along the anterior wall of the esophagus.  Unfortunately putting the patient in the prone position is not feasible at this time and he is also too big to fit on the table.  --s/p EGD --no esophageal deformity noted  COPD (chronic obstructive pulmonary disease) ([name]) Severe COPD on Combivent, as needed albuterol.  On yuperli  nebulizer.   Acute kidney injury superimposed on CKD ([name]) Creat stbale at 1.5 --cont torsemide and spironolactone   Anxiety On Atarax as needed.  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   Chronic colitis Continue home mesalamine   Generalized weakness Continue working with physical therapy and mobility specialist to work on getting out of the bed and ambulating.  Continue also doing exercises in the bed.   Morbid obesity with BMI of 50.0-59.9, adult ([name]) BMI 51.83   Family communication : none Consults : pulmonary CODE STATUS: full DVT Prophylaxis : Lovenox Level of care: Med-Surg Status is: Inpatient Remains inpatient appropriate because: TOC for d/c planning  Pt has been walking 1/2 way around the nurses station with PT     TOTAL TIME TAKING CARE OF THIS PATIENT: 25 minutes.  >50% time spent on counselling and coordination of care  Note: This dictation was prepared with Dragon dictation along with smaller phrase technology. Any transcriptional errors that result from this process are unintentional.  [name] M.D    [facility]   CC: Primary care physician; Shayne Alken, MD

## 2023-02-19 NOTE — Plan of Care (Signed)
  Problem: Education: Goal: Knowledge of General Education information will improve Description Including pain rating scale, medication(s)/side effects and non-pharmacologic comfort measures Outcome: Progressing   

## 2023-02-19 NOTE — Progress Notes (Signed)
Mobility Specialist - Progress Note   02/19/23 1613  Mobility  Activity Transferred from chair to bed  Level of Assistance +2 (takes two people)  Assistive Device  Secondary school teacher)  Activity Response Tolerated well  $Mobility charge 1 Mobility  Mobility Specialist Start Time (ACUTE ONLY) 1438  Mobility Specialist Stop Time (ACUTE ONLY) 1450  Mobility Specialist Time Calculation (min) (ACUTE ONLY) 12 min   Pt transferred from chair to bed via [employer] lift +2 for safety. Pt left in fowler position with needs within reach.  Zetta Bills Mobility Specialist 02/19/23 4:14 PM
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
## 2023-02-19 NOTE — Progress Notes (Signed)
Occupational Therapy Treatment Patient Details Name: Gary [name] MRN: [id number] DOB: 1955/12/10 Today's Date: 02/19/2023   History of present illness 67 y/o male presented to [facility] ED on 11/28/22 for chest pain x 4 days. Admitted for acute on chronic CHF. Frequent admissions this year with most recent dsicharge on 11/21/22. PMH includes obesity, hypoxia, respiratory failure with tracheostomy, COPD, GIB, HFpEF.   OT comments  Gary [name] was seen for OT treatment on this date. Upon arrival to room pt reclined in bed, agreeable to tx. Pt requires no assist to exit bed. SUPERVISION + RW sit<>stand from elevated bed height, tolerates ~120 ft funcitonal mobility. Reports plan to participate in x2 mobility sessions daily. Pt making good progress toward goals, will continue to follow POC. Discharge recommendation remains appropriate.        If plan is discharge home, recommend the following:  A little help with walking and/or transfers;A lot of help with bathing/dressing/bathroom   Equipment Recommendations  BSC/3in1    Recommendations for Other Services      Precautions / Restrictions Precautions Precautions: Fall Precaution Comments: trach collar/ Trach Restrictions Weight Bearing Restrictions: No       Mobility Bed Mobility Overal bed mobility: Needs Assistance Bed Mobility: Supine to Sit Rolling: Supervision              Transfers Overall transfer level: Needs assistance Equipment used: Rolling walker (2 wheels) Transfers: Sit to/from Stand Sit to Stand: From elevated surface, Supervision                 Balance Overall balance assessment: Needs assistance Sitting-balance support: Feet supported, No upper extremity supported Sitting balance-[name] Scale: Good     Standing balance support: Bilateral upper extremity supported Standing balance-[name] Scale: Fair                             ADL either performed or assessed with clinical judgement    ADL Overall ADL's : Needs assistance/impaired                                       General ADL Comments: MAX A don B shoes in sitting      Cognition Arousal: Alert Behavior During Therapy: WFL for tasks assessed/performed Overall Cognitive Status: Within Functional Limits for tasks assessed                                                     Pertinent Vitals/ Pain       Pain Assessment Pain Assessment: No/denies pain   Frequency  Min 1X/week        Progress Toward Goals  OT Goals(current goals can now be found in the care plan section)  Progress towards OT goals: Progressing toward goals  Acute Rehab OT Goals Patient Stated Goal: get stronger OT Goal Formulation: With patient Time For Goal Achievement: 02/28/23 Potential to Achieve Goals: Good ADL Goals Pt Will Perform Grooming: with modified independence;standing Pt Will Transfer to Toilet: with modified independence;ambulating;regular height toilet Pt Will Perform Toileting - Clothing Manipulation and hygiene: with mod assist;sit to/from stand Additional ADL Goal #1: Pt will stand from the hospital bed or recliner adn LR DME with min A  as a precursor to ADLs  Plan Discharge plan remains appropriate;Frequency remains appropriate    Co-evaluation                 AM-PAC OT "6 Clicks" Daily Activity     Outcome Measure   Help from another person eating meals?: None Help from another person taking care of personal grooming?: A Little Help from another person toileting, which includes using toliet, bedpan, or urinal?: A Lot Help from another person bathing (including washing, rinsing, drying)?: A Lot Help from another person to put on and taking off regular upper body clothing?: None Help from another person to put on and taking off regular lower body clothing?: A Lot 6 Click Score: 17    End of Session Equipment Utilized During Treatment: Oxygen;Rolling walker (2  wheels)  OT Visit Diagnosis: Unsteadiness on feet (R26.81);Other abnormalities of gait and mobility (R26.89);Muscle weakness (generalized) (M62.81);Adult, failure to thrive (R62.7)   Activity Tolerance Patient tolerated treatment well   Patient Left in chair;with call bell/phone within reach   Nurse Communication          Time: 7829-5621 OT Time Calculation (min): 23 min  Charges: OT General Charges $OT Visit: 1 Visit OT Treatments $Self Care/Home Management : 8-22 mins $Therapeutic Activity: 8-22 mins  Kathie Dike, M.S. OTR/L  02/19/23, 2:04 PM  ascom [phone]

## 2023-02-20 DIAGNOSIS — I5033 Acute on chronic diastolic (congestive) heart failure: Secondary | ICD-10-CM | POA: Diagnosis not present

## 2023-02-20 NOTE — Progress Notes (Signed)
Physical Therapy Treatment Patient Details Name: Gary [name] MRN: [id number] DOB: [birth year] Today's Date: 02/20/2023   History of Present Illness 67 y/o male presented to [facility] ED on 11/28/22 for chest pain x 4 days. Admitted for acute on chronic CHF. Frequent admissions this year with most recent dsicharge on 11/21/22. PMH includes obesity, hypoxia, respiratory failure with tracheostomy, COPD, GIB, HFpEF.    PT Comments  Pt was willing and motivated to participate during the session and put forth good effort throughout. Pt continues to need supervision with bed mobility and transfers, CGA during amb with BRW. Once up he was able to walk ~130 feet  on 3L O2, with VC's to breathe through nose, pt sitting down at end of bout 2/2 fatigue. He is currently on 2L via [place] at rest with HR WFL, and SpO2 in mid 90's, amb was performed on 3L via [place] due to pt request but SpO2 remained in mid 90's. Pt reports no imbalance, but noting he is slightly SOB towards end of bout despite VC's for proper breathing technique. Pt will benefit from continued PT services upon discharge to safely address deficits listed in patient problem list for decreased caregiver assistance and eventual return to PLOF.      If plan is discharge home, recommend the following: A lot of help with bathing/dressing/bathroom;Assist for transportation;A lot of help with walking and/or transfers;A little help with walking and/or transfers   Can travel by private vehicle     No  Equipment Recommendations  Other (comment) (Need to confirm pt's home equipment is present and working prior to Dc)    Recommendations for Other Services       Precautions / Restrictions Precautions Precautions: Fall Precaution Comments: trach collar/ Trach Restrictions Weight Bearing Restrictions: No RLE Weight Bearing: Weight bearing as tolerated     Mobility  Bed Mobility Overal bed mobility: Needs Assistance Bed Mobility: Supine to Sit     Supine to  sit: Supervision          Transfers Overall transfer level: Needs assistance Equipment used: Rolling walker (2 wheels) Transfers: Sit to/from Stand Sit to Stand: From elevated surface, Supervision           General transfer comment: Pt continues to request heightened bed surface to stand form, despite encouragement to attempt from lower bed heights. On 2L via [place] for bed mobility performed, SpO2 at rest 95%, HR WFL    Ambulation/Gait Ambulation/Gait assistance: Contact guard assist Gait Distance (Feet): 130 Feet Assistive device: Rolling walker (2 wheels) Gait Pattern/deviations: Step-through pattern Gait velocity: decreased     General Gait Details: Amb with chair follow, Pt having concerns of his shoulders bothering him from earlier in the day but did not specify any pain. Pt perofrmed bout on 3L via [place] due to patient request, with HR remaining WFL, and SpO2 remaining mid 90's throughout mobility   Stairs             Wheelchair Mobility     Tilt Bed    Modified Rankin (Stroke Patients Only)       Balance Overall balance assessment: Needs assistance Sitting-balance support: Feet supported, No upper extremity supported Sitting balance-[name] Scale: Good     Standing balance support: Bilateral upper extremity supported Standing balance-[name] Scale: Fair Standing balance comment: Reliant on RW/AD for all standing activity.  Cognition Arousal: Alert Behavior During Therapy: WFL for tasks assessed/performed Overall Cognitive Status: Within Functional Limits for tasks assessed                                          Exercises Other Exercises Other Exercises: encouragement to frequently mobilize in order to improve max ambulatory distance    General Comments General comments (skin integrity, edema, etc.): Pt now on 2L via  at rest satting into mid 90's. Pt increased to 3L due to patient request  during ambulatory bout with SpO2 remaining mid 90's      Pertinent Vitals/Pain Pain Assessment Pain Assessment: No/denies pain Pain Intervention(s): Monitored during session, Limited activity within patient's tolerance    Home Living                          Prior Function            PT Goals (current goals can now be found in the care plan section) Progress towards PT goals: Progressing toward goals    Frequency    Min 1X/week      PT Plan      Co-evaluation              AM-PAC PT "6 Clicks" Mobility   Outcome Measure  Help needed turning from your back to your side while in a flat bed without using bedrails?: None Help needed moving from lying on your back to sitting on the side of a flat bed without using bedrails?: None Help needed moving to and from a bed to a chair (including a wheelchair)?: A Lot Help needed standing up from a chair using your arms (e.g., wheelchair or bedside chair)?: A Lot Help needed to walk in hospital room?: A Lot Help needed climbing 3-5 steps with a railing? : Total 6 Click Score: 15    End of Session Equipment Utilized During Treatment: Oxygen (2-3L) Activity Tolerance: Patient tolerated treatment well;Patient limited by fatigue Patient left: in chair;with call bell/phone within reach Nurse Communication: Mobility status PT Visit Diagnosis: Muscle weakness (generalized) (M62.81);Difficulty in walking, not elsewhere classified (R26.2)     Time: 3086-5784 PT Time Calculation (min) (ACUTE ONLY): 29 min  Charges:                            Cecile Sheerer, SPT 02/20/23, 3:19 PM

## 2023-02-20 NOTE — Plan of Care (Signed)

## 2023-02-20 NOTE — Progress Notes (Signed)
Mobility Specialist - Progress Note   02/20/23 1003  Mobility  Activity Stood at bedside  Level of Assistance Standby assist, set-up cues, supervision of patient - no hands on  Assistive Device Front wheel walker  Range of Motion/Exercises Active;Right leg;Left leg  Activity Response Tolerated well  $Mobility charge 1 Mobility  Mobility Specialist Start Time (ACUTE ONLY) [id number]  Mobility Specialist Stop Time (ACUTE ONLY) [id number]  Mobility Specialist Time Calculation (min) (ACUTE ONLY) 20 min   Pt simi fowler upon entry, utilizing RA. Pt completed bed mob ModI, heavy reliance on bed function to assist with mob. Pt STS to RW X2 with supervision, returning EOB after each STS-- bed elevation ~27", actively pushing through to stand with RUE on the bed and LUE on the bed railing. When standing Pt completed 30 second standing mini marches X2, tolerated well. Pt returned to bed, left in fowler position with needs within reach.   Zetta Bills Mobility Specialist 02/20/23 10:13 AM

## 2023-02-20 NOTE — Progress Notes (Signed)
Per Dr Karna Christmas RT placed pt on a 2L [place] and took trach collar off. Pt still has on passy muir valve. Pt tolerating well at this time.

## 2023-02-20 NOTE — Progress Notes (Signed)
Mobility Specialist - Progress Note   02/20/23 1623  Mobility  Activity Transferred from chair to bed  Level of Assistance +2 (takes two people)  Assistive Device  Water quality scientist lift)  Activity Response Tolerated well  $Mobility charge 1 Mobility  Mobility Specialist Start Time (ACUTE ONLY) 1455  Mobility Specialist Stop Time (ACUTE ONLY) 1503  Mobility Specialist Time Calculation (min) (ACUTE ONLY) 8 min   Pt sitting in the recliner, utilizing [place] 2L. Pt transferred chair to bed via [facility] lift +2 for safety. Pt left in fowler position with needs within reach. RN present at bedside.  Zetta Bills Mobility Specialist 02/20/23 4:25 PM

## 2023-02-20 NOTE — Progress Notes (Signed)
OT Cancellation Note  Patient Details Name: Gary [name] MRN: [id number] DOB: 19-Apr-1955   Cancelled Treatment:    Reason Eval/Treat Not Completed: Other (comment) Pt just finished working with PT.  Reports he's about to eat.  Will attempt later as time allows.  Danelle Earthly, MS, OTR/L  Otis Dials 02/20/2023, 2:04 PM
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
## 2023-02-20 NOTE — Progress Notes (Signed)
[facility]  - [place] at [facility]   PATIENT NAME: Gary [name]    MR#:  [id number]  DATE OF BIRTH:  1955-09-01  SUBJECTIVE:  Reports breathing stable, working with mobility, feels as though strength is returning  VITALS:  Blood pressure 112/71, pulse 83, temperature 98.3 F (36.8 C), resp. rate 18, height 6\' 4"  (1.93 m), weight (!) 181 kg, SpO2 93%.  PHYSICAL EXAMINATION:   GENERAL:  67 y.o.-year-old patient with no acute distress. Morbid obesity,  Trach   LUNGS: distant breath sounds bilaterally, no wheezing. Normal WOB CARDIOVASCULAR: S1, S2 normal. No murmur. Distant heart sounds  EXTREMITIES: trace edema b/l.    NEUROLOGIC: nonfocal  patient is alert and awake and moving all 4  LABORATORY PANEL:     Chemistries  Recent Labs  Lab 02/19/23 0906  NA 135  K 3.7  CL 100  CO2 28  GLUCOSE 121*  BUN 29*  CREATININE 1.40*  CALCIUM 8.6*     Acute on chronic diastolic CHF (congestive heart failure) ([name]) Continue torsemide , spironolactone.   Creatinine stable at 1.5.   Acute on chronic respiratory failure with hypoxia and hypercapnia ([name]) Trach Dependent Pseudomonas tracheitis On trach collar. S/p abx course for pseudomonas tracheitis Also evaluated for non-massive hemoptysis with CT soft tissue of neck done on 02/08/2023 .  Tracheostomy tube is in place and there is no definite collection around the tube.  There is thinning of the wall between the esophagus and trachea with possible defect. Pulm following, today trach collar capped, patient placed on 2 liters  COPD (chronic obstructive pulmonary disease) ([name]) Severe COPD on Combivent, as needed albuterol.  On yuperli nebulizer.   Acute kidney injury superimposed on CKD ([name]) Creat stable at ~1.5 - monitor periodically   Anxiety On Atarax as needed.  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    Chronic colitis Stable Continue home mesalamine   Generalized weakness Continue working with physical therapy and mobility specialist to work on getting out of the bed and ambulating.  Continue also doing exercises in the bed.   Morbid obesity with BMI of 50.0-59.9, adult ([name]) Complicates above care  Family communication : none Consults : pulmonary CODE STATUS: full DVT Prophylaxis : Lovenox Level of care: Med-Surg Status is: Inpatient Remains inpatient appropriate because: TOC for d/c planning      TOTAL TIME TAKING CARE OF THIS PATIENT: 25 minutes.   Silvano Bilis M.D    [facility]

## 2023-02-21 DIAGNOSIS — I5033 Acute on chronic diastolic (congestive) heart failure: Secondary | ICD-10-CM | POA: Diagnosis not present

## 2023-02-21 NOTE — Plan of Care (Signed)

## 2023-02-21 NOTE — Progress Notes (Signed)
This pt has refused his v/s being taken during the shift, and has refused to have his tech do pt care. This morning, he was heard shouting as his tech left his room. Multiple staff when to investigate. Upon entering his room, he demanded that those who not his nurse were to leave his room. When speaking with him to determine the reason for his outburst, he went on to shout that the staff was lazy and not doing as he requested. It was reported that he was verbally abusive to his tech and using expletives at her. When asked, he mentioned that he can speak to anyone in whichever way he chooses and that he will continue to do so as he sees fit. I attempted to diffuse the situation and assist him where necessary. The CN was informed of this interaction.

## 2023-02-21 NOTE — Progress Notes (Signed)
I went to the room just to check on pt. Primary nurse followed while I was talking to the pt and started to prepare medications. Telesitter in the room. Pt started getting upset with the Nurse about not wearing gloves while opening his medications. Nurse didn't touch the pills and opened them and put it in the medicine cup. Pt refused to take the medicine in the cup. Pt started to be upset and complained about nurse not wearing gloves. I told him that he didn't touch the medicine and it was on camera that he didn't touch them, pt still insisted he is not taking them. I told the pt not to take them and we will get him new ones. Pt said yes and throw his pills on the trash but still upset and started being verbally abusive to the Primary nurse by telling night shifts and staffs don't do anything, will just give medications and then don't come to check on him, will only see them again during shift change. I called security and asked them to talk to him. Step out of the room and let security talk to him. Went to pull his medicine that he didn't take to the Pyxis and talk to him that he is not supposed to be treating staffs with disrespect as we are caring for him and that he don't know that they've been checking on him because he is asleep. I told him the telesitter can monitor staffs who are going in and out of the room even if he is asleep. Pt apologized to me but not to the Primary nurse.

## 2023-02-21 NOTE — Progress Notes (Signed)
Mobility Specialist - Progress Note   02/21/23 1102  Mobility  Activity Ambulated with assistance in hallway  Level of Assistance Standby assist, set-up cues, supervision of patient - no hands on  Assistive Device Front wheel walker  Distance Ambulated (ft) 30 ft  Activity Response Tolerated well  $Mobility charge 1 Mobility  Mobility Specialist Start Time (ACUTE ONLY) 1005  Mobility Specialist Stop Time (ACUTE ONLY) 1031  Mobility Specialist Time Calculation (min) (ACUTE ONLY) 26 min   Pt supine upon entry, utilizing 2L [place]. Pt initially agreeable to practice STS from [facility], however self limited throughout the session. Pt completed bed mob ModI, STS to RW and amb ~15 ft SBA outside the room before quickly returning to the room. Pt returned to the room, dangled EOB while educated extensively on the importance of mobility. Pt left in fowler position with needs within reach, RN present at bedside.  Zetta Bills Mobility Specialist 02/21/23 11:17 AM

## 2023-02-21 NOTE — Progress Notes (Signed)
OT Cancellation Note  Patient Details Name: Gary [name] MRN: [id number] DOB: [birth year]   Cancelled Treatment:    Reason Eval/Treat Not Completed: Other (comment) Pt reports he's already been up this morning with mobility specialists and requests an attempt after lunch.  Will attempt later as time allows.    Danelle Earthly, MS, OTR/L   [facility] Dials 02/21/2023, 10:51 AM

## 2023-02-21 NOTE — Progress Notes (Signed)
Mobility Specialist - Progress Note   02/21/23 1622  Mobility  Activity Transferred from chair to bed  Level of Assistance +2 (takes two people)  Assistive Device  Water quality scientist lift)  Activity Response Tolerated well  $Mobility charge 1 Mobility  Mobility Specialist Start Time (ACUTE ONLY) 1438  Mobility Specialist Stop Time (ACUTE ONLY) 1452  Mobility Specialist Time Calculation (min) (ACUTE ONLY) 14 min   Pt sitting in the recliner upon entry, utilizing 2L [place]. Pt transferred chair to bed via [facility] lift +2 for safety. Pt left in fowler position with needs within reach.  Zetta Bills Mobility Specialist 02/21/23 4:24 PM

## 2023-02-21 NOTE — Progress Notes (Signed)
Occupational Therapy Treatment Patient Details Name: Gary [name] MRN: [id number] DOB: [birth year] Today's Date: 02/21/2023   History of present illness 67 y/o male presented to [facility] ED on 11/28/22 for chest pain x 4 days. Admitted for acute on chronic CHF. Frequent admissions this year with most recent dsicharge on 11/21/22. PMH includes obesity, hypoxia, respiratory failure with tracheostomy, COPD, GIB, HFpEF.   OT comments  [name] was seen for OT treatment on this date. Upon arrival to room pt reclined in bed, agreeable to tx. Pt requires no physical assist to exit  bed using bed controls. SUPERVISION + RW to stand from elevated bed height, tolerates ~80 ft mobility on 4L [place] - desat 89%, reports shortness of breath limiting distance this session. Pt making progress toward goals, will continue to follow POC. Discharge recommendation remains appropriate.       If plan is discharge home, recommend the following:  A little help with walking and/or transfers;A lot of help with bathing/dressing/bathroom   Equipment Recommendations  BSC/3in1    Recommendations for Other Services      Precautions / Restrictions Precautions Precautions: Fall Restrictions Weight Bearing Restrictions: No       Mobility Bed Mobility Overal bed mobility: Needs Assistance Bed Mobility: Supine to Sit     Supine to sit: Supervision          Transfers Overall transfer level: Needs assistance Equipment used: Rolling walker (2 wheels) Transfers: Sit to/from Stand Sit to Stand: Supervision                 Balance Overall balance assessment: Needs assistance Sitting-balance support: Feet supported, No upper extremity supported Sitting balance-[name] Scale: Good     Standing balance support: Bilateral upper extremity supported Standing balance-[name] Scale: Fair                             ADL either performed or assessed with clinical judgement   ADL Overall ADL's : Needs  assistance/impaired                                       General ADL Comments: MAX A don B shoes in sitting      Cognition Arousal: Alert Behavior During Therapy: WFL for tasks assessed/performed Overall Cognitive Status: Within Functional Limits for tasks assessed                                                     Pertinent Vitals/ Pain       Pain Assessment Pain Assessment: No/denies pain   Frequency  Min 1X/week        Progress Toward Goals  OT Goals(current goals can now be found in the care plan section)  Progress towards OT goals: Progressing toward goals  Acute Rehab OT Goals Patient Stated Goal: to go home OT Goal Formulation: With patient Time For Goal Achievement: 02/28/23 Potential to Achieve Goals: Good ADL Goals Pt Will Perform Grooming: with modified independence;standing Pt Will Transfer to Toilet: with modified independence;ambulating;regular height toilet Pt Will Perform Toileting - Clothing Manipulation and hygiene: with mod assist;sit to/from stand Additional ADL Goal #1: Pt will stand from the hospital bed or recliner adn LR DME  with min A as a precursor to ADLs  Plan Discharge plan remains appropriate;Frequency remains appropriate    Co-evaluation                 AM-PAC OT "6 Clicks" Daily Activity     Outcome Measure   Help from another person eating meals?: None Help from another person taking care of personal grooming?: A Little Help from another person toileting, which includes using toliet, bedpan, or urinal?: A Lot Help from another person bathing (including washing, rinsing, drying)?: A Lot Help from another person to put on and taking off regular upper body clothing?: None Help from another person to put on and taking off regular lower body clothing?: A Lot 6 Click Score: 17    End of Session Equipment Utilized During Treatment: Oxygen;Rolling walker (2 wheels)  OT Visit Diagnosis:  Unsteadiness on feet (R26.81);Other abnormalities of gait and mobility (R26.89);Muscle weakness (generalized) (M62.81);Adult, failure to thrive (R62.7)   Activity Tolerance Patient tolerated treatment well   Patient Left in chair;with call bell/phone within reach   Nurse Communication Mobility status        Time: 1315-1330 OT Time Calculation (min): 15 min  Charges: OT General Charges $OT Visit: 1 Visit OT Treatments $Therapeutic Activity: 8-22 mins  Kathie Dike, M.S. OTR/L  02/21/23, 3:32 PM  ascom [phone]

## 2023-02-21 NOTE — Progress Notes (Signed)
Patient refused to take night meds. Pt complains that nurse did not have on gloves when the medicine was given. Nurse explained to patient proper procedure and protocol. Patient refused to take medication and also refused to give the medicine back. After speaking with security and charge nurse patient agreed to have the medicine re-administered and then threw the pills he had in his hand on the floor. Incident witnessed by  tele sitter, security, and charge.

## 2023-02-21 NOTE — Progress Notes (Signed)
Late entry: At appoximately 0605 this am, after removing patient from home trilogy, I capped tracheostomy. Patient was able to speak with no complaints. States that this feels better than passy muir

## 2023-02-21 NOTE — Progress Notes (Signed)
[facility]  - [place] at [facility]   PATIENT NAME: Gary [name]    MR#:  [id number]  DATE OF BIRTH:  [birth year]  SUBJECTIVE:  Reports breathing stable, working with mobility, tolerating diet, no pain  VITALS:  Blood pressure 107/76, pulse 79, temperature 98.3 F (36.8 C), resp. rate 18, height 6\' 4"  (1.93 m), weight (!) 181 kg, SpO2 94%.  PHYSICAL EXAMINATION:   GENERAL:  67 y.o.-year-old patient with no acute distress. Morbid obesity,  Trach   LUNGS: distant breath sounds bilaterally, no wheezing. Normal WOB CARDIOVASCULAR: S1, S2 normal. No murmur. Distant heart sounds  EXTREMITIES: trace edema b/l.    NEUROLOGIC: nonfocal  patient is alert and awake and moving all 4  LABORATORY PANEL:     Chemistries  Recent Labs  Lab 02/19/23 0906  NA 135  K 3.7  CL 100  CO2 28  GLUCOSE 121*  BUN 29*  CREATININE 1.40*  CALCIUM 8.6*     Acute on chronic diastolic CHF (congestive heart failure) ([name]) Continue torsemide , spironolactone.   Creatinine stable at 1.5.   Acute on chronic respiratory failure with hypoxia and hypercapnia ([name]) Trach Dependent Pseudomonas tracheitis On trach collar. S/p abx course for pseudomonas tracheitis Also evaluated for non-massive hemoptysis with CT soft tissue of neck done on 02/08/2023 .  Tracheostomy tube is in place and there is no definite collection around the tube.  There is thinning of the wall between the esophagus and trachea with possible defect. Pulm following, today trach collar capped, patient stable on 2 liters  COPD (chronic obstructive pulmonary disease) ([name]) Severe COPD on Combivent, as needed albuterol.  On yuperli nebulizer.   Acute kidney injury superimposed on CKD ([name]) Creat stable at ~1.5 - monitor periodically   Anxiety On Atarax as needed.  Continue Lexapro   OSA (obstructive sleep apnea) On niv at bedtime, will trial cpap tonight as if he tolerates that can facilitate going to snf, pulm  thinks cpap is probably sufficient   Eyelid cyst, right Lanced by ophthalmology.   Chronic colitis Stable Continue home mesalamine   Generalized weakness Continue working with physical therapy and mobility specialist to work on getting out of the bed and ambulating.  Continue also doing exercises in the bed.   Morbid obesity with BMI of 50.0-59.9, adult ([name]) Complicates above care  Family communication : none Consults : pulmonary CODE STATUS: full DVT Prophylaxis : Lovenox Level of care: Med-Surg Status is: Inpatient Remains inpatient appropriate because: TOC for d/c planning      TOTAL TIME TAKING CARE OF THIS PATIENT: 35 minutes.   Silvano Bilis M.D    [facility]
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
## 2023-02-22 DIAGNOSIS — I5033 Acute on chronic diastolic (congestive) heart failure: Secondary | ICD-10-CM | POA: Diagnosis not present

## 2023-02-22 MED ORDER — TORSEMIDE 20 MG PO TABS
40.0000 mg | ORAL_TABLET | Freq: Two times a day (BID) | ORAL | Status: DC
  Administered 2023-02-22 – 2023-04-11 (×96): 40 mg via ORAL
  Filled 2023-02-22 (×96): qty 2

## 2023-02-22 NOTE — TOC Progression Note (Addendum)
Transition of Care [facility]) - Progression Note    Patient Details  Name: Gary [name] MRN: [id number] Date of Birth: 07/08/1955  Transition of Care [facility]) CM/SW Contact  Marlowe Sax, RN Phone Number: 02/22/2023, 11:28 AM  Clinical Narrative:    Met with the patient in the room, Spoke with him about when he will be ready to go home, I stated that he is doing so much better, he stated that he is trying and will be ready when his legs are stronger. He stated that he is still not safe at home updated FL2 and sent out to all Hub looking for a STR bed   Expected Discharge Plan: Home w Home Health Services Barriers to Discharge: Continued Medical Work up  Expected Discharge Plan and Services     Post Acute Care Choice: Home Health                   DME Arranged: Bedside commode DME Agency: [employer] Date DME Agency Contacted: 01/12/23 Time DME Agency Contacted: 1427 Representative spoke with at DME Agency: [facility] Arranged: PT, OT HH Agency: [facility] Date [facility] Agency Contacted: 01/12/23 Time HH Agency Contacted: 1428 Representative spoke with at [facility] Agency: Kandee Keen   Social Determinants of Health (SDOH) Interventions SDOH Screenings   Food Insecurity: No Food Insecurity (12/01/2022)  Housing: Patient Declined (12/01/2022)  Transportation Needs: No Transportation Needs (12/01/2022)  Utilities: Not At Risk (12/01/2022)  Depression (PHQ2-9): Low Risk  (06/07/2021)  Financial Resource Strain: High Risk (11/21/2022)   Received from [facility]  Physical Activity: Insufficiently Active (05/03/2017)  Social Connections: Patient Declined (11/20/2022)   Received from [facility]  Stress: Stress Concern Present (11/20/2022)   Received from [facility]  Tobacco Use: Medium Risk (02/13/2023)    Readmission Risk Interventions    11/05/2022    2:59 PM 11/05/2022   12:16 PM 12/31/2021    8:58 AM  Readmission Risk Prevention Plan  Transportation Screening Complete  Complete Complete  Medication Review [employer]) Complete Complete Complete  PCP or Specialist appointment within 3-5 days of discharge Complete Complete Complete  HRI or Home Care Consult Complete Complete Complete  SW Recovery Care/Counseling Consult Complete Complete Complete  Palliative Care Screening Complete Complete Complete  Skilled Nursing Facility Not Applicable Not Applicable Not Applicable

## 2023-02-22 NOTE — Progress Notes (Signed)
[facility]  - [place] [facility]   PATIENT NAME: Gary [name]    MR#:  [id number]  DATE OF BIRTH:  1955-11-24  SUBJECTIVE:  Reports breathing stable, working with mobility, tolerating diet, no pain, thinks lower extremity are starting to swell after reducing frequency of torsemide last week  VITALS:  Blood pressure 107/74, pulse 80, temperature 97.8 F (36.6 C), temperature source Oral, resp. rate 18, height 6\' 4"  (1.93 m), weight (!) 181 kg, SpO2 90%.  PHYSICAL EXAMINATION:   GENERAL:  67 y.o.-year-old patient with no acute distress. Morbid obesity,  Trach   LUNGS: distant breath sounds bilaterally, no wheezing. Normal WOB CARDIOVASCULAR: S1, S2 normal. No murmur. Distant heart sounds  EXTREMITIES: trace edema b/l.    NEUROLOGIC: nonfocal  patient is alert and awake and moving all 4  LABORATORY PANEL:     Chemistries  Recent Labs  Lab 02/19/23 0906  NA 135  K 3.7  CL 100  CO2 28  GLUCOSE 121*  BUN 29*  CREATININE 1.40*  CALCIUM 8.6*     Acute on chronic diastolic CHF (congestive heart failure) ([name]) Continue torsemide , spironolactone.  Will increase torsemide back to bid which was home dosing, patient requests to go back on this says can sense lower extremity swelling - will plan on checking bmp 11/10   Acute on chronic respiratory failure with hypoxia and hypercapnia ([name]) Trach Dependent Pseudomonas tracheitis On trach collar. S/p abx course for pseudomonas tracheitis Also evaluated for non-massive hemoptysis with CT soft tissue of neck done on 02/08/2023 .  Tracheostomy tube is in place and there is no definite collection around the tube.  There is thinning of the wall between the esophagus and trachea with possible defect. Pulm following, today trach collar capped, patient stable on 2 liters  COPD (chronic obstructive pulmonary disease) ([name]) Severe COPD on Combivent, as needed albuterol.  On yuperli nebulizer.   Acute kidney injury  superimposed on CKD ([name]) Creat stable at ~1.5 - monitor periodically   Anxiety On Atarax as needed.  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   Chronic colitis Stable Continue home mesalamine   Generalized weakness Continue working with physical therapy and mobility specialist to work on getting out of the bed and ambulating.  Continue also doing exercises in the bed.   Morbid obesity with BMI of 50.0-59.9, adult ([name]) Complicates above care  Family communication : none Consults : pulmonary CODE STATUS: full DVT Prophylaxis : Lovenox Level of care: Med-Surg Status is: Inpatient Remains inpatient appropriate because: TOC for d/c planning      TOTAL TIME TAKING CARE OF THIS PATIENT: 25 minutes.   Silvano Bilis M.D    [facility]
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
## 2023-02-22 NOTE — [place] FL2 (Signed)
[place] MEDICAID FL2 LEVEL OF CARE FORM     IDENTIFICATION  Patient Name: Gary [name] Birthdate: [birth year] Sex: male Admission Date (Current Location): 11/28/2022  [facility] and [place] Number:  [address] [id number] K Facility and Address:  [facility], [address], [place], [place] [address]      Provider Number: [id number]  Attending Physician Name and Address:  Kathrynn Running, MD  Relative Name and Phone Number:  Dellia Cloud Sister [phone]    Current Level of Care: Hospital Recommended Level of Care: Skilled Nursing Facility Prior Approval Number:    Date Approved/Denied:   PASRR Number: [id number]  Discharge Plan: SNF    Current Diagnoses: Patient Active Problem List   Diagnosis Date Noted   Dry skin 01/25/2023   Eyelid cyst, right 01/23/2023   Fluid overload 11/30/2022   Acute on chronic respiratory failure ([name]) 11/02/2022   History of DVT (deep vein thrombosis) 10/27/2022   Acute kidney injury superimposed on CKD ([name]) 10/27/2022   Acute on chronic respiratory failure with hypoxia ([name]) 10/17/2022   Chronic colitis 09/12/2022   Segmental colitis associated with diverticulosis ([name]) 09/12/2022   Diverticulosis of colon with hemorrhage 09/10/2022   Lower GI bleed 09/07/2022   Chest pain 08/13/2022   Influenza A 04/11/2022   Hypokalemia 03/06/2022   Chronic obstructive pulmonary disease (COPD) ([name]) 02/03/2022   GERD without esophagitis 02/03/2022   Anxiety 02/03/2022   Hematochezia    Major depressive disorder, recurrent episode, moderate ([name]) 10/22/2021   Pressure injury of skin 09/27/2021   COPD (chronic obstructive pulmonary disease) ([name])    Iron deficiency anemia    Generalized weakness    (HFpEF) heart failure with preserved ejection fraction ([name])    Hyperlipidemia 02/19/2021   Generalized osteoarthritis of multiple sites 10/31/2020   Hyperkalemia 03/04/2020   Marijuana use 01/17/2020   Thrombocytopenia  ([name]) 01/17/2020   Positive hepatitis C antibody test 01/17/2020   Osteoarthritis of glenohumeral joints (Bilateral) 01/17/2020   Osteoarthritis of AC (acromioclavicular) joints (Bilateral) 01/17/2020   Polysubstance abuse ([name]) 08/25/2019   Toxic metabolic encephalopathy 08/25/2019   Chronic pain syndrome 07/14/2019   Anticoagulated 07/14/2019   DDD (degenerative disc disease), lumbosacral 07/14/2019   DDD (degenerative disc disease), cervical 07/14/2019   Acute on chronic respiratory failure with hypoxia and hypercapnia ([name]) 05/28/2018   Acute on chronic diastolic CHF (congestive heart failure) ([name]) 02/28/2015   OSA (obstructive sleep apnea) 02/28/2015   Morbid obesity with BMI of 50.0-59.9, adult ([name]) 04/01/2014   Hypotension 04/01/2014    Orientation RESPIRATION BLADDER Height & Weight     Self, Time, Situation, Place  O2, Other (Comment) (nasal canula and CPAP at night) Continent Weight:  (patients bed does not work.) Height:  6\' 4"  (193 cm)  BEHAVIORAL SYMPTOMS/MOOD NEUROLOGICAL BOWEL NUTRITION STATUS   (None)  (None) Continent Diet (see dc summary)  AMBULATORY STATUS COMMUNICATION OF NEEDS Skin   Limited Assist Verbally Normal   PU Stage 2 Dressing: No Dressing (Left buttocks)                   Personal Care Assistance Level of Assistance  Bathing, Feeding, Dressing Bathing Assistance: Limited assistance Feeding assistance: Independent Dressing Assistance: Limited assistance     Functional Limitations Info  Sight, Hearing, Speech Sight Info: Adequate Hearing Info: Adequate Speech Info: Adequate    SPECIAL CARE FACTORS FREQUENCY  PT (By licensed PT), OT (By licensed OT)     PT Frequency: 5 times per week OT Frequency:  5 times per week            Contractures Contractures Info: Not present    Additional Factors Info  Code Status, Allergies Code Status Info: full code Allergies Info: NKDA           Current Medications (02/22/2023):  This is  the current hospital active medication list Current Facility-Administered Medications  Medication Dose Route Frequency Provider Last Rate Last Admin   albuterol (PROVENTIL) (2.5 MG/3ML) 0.083% nebulizer solution 2.5 mg  2.5 mg Nebulization Q4H PRN Vida Rigger, MD   2.5 mg at 01/27/23 2013   ammonium lactate (LAC-HYDRIN) 12 % lotion 1 Application  1 Application Topical BID PRN Alford Highland, MD   1 Application at 01/26/23 0036   amphetamine-dextroamphetamine (ADDERALL) tablet 30 mg  30 mg Oral Q breakfast Vida Rigger, MD   30 mg at 02/22/23 6213   apixaban (ELIQUIS) tablet 2.5 mg  2.5 mg Oral BID Lurene Shadow, MD   2.5 mg at 02/22/23 0865   bacitracin ointment   Topical Daily Vida Rigger, MD   1 Application at 02/22/23 0929   bisacodyl (DULCOLAX) EC tablet 10 mg  10 mg Oral Daily [name], MD   10 mg at 02/22/23 7846   docusate sodium (COLACE) capsule 100 mg  100 mg Oral Daily Verdene Lennert, MD   100 mg at 02/22/23 0928   escitalopram (LEXAPRO) tablet 10 mg  10 mg Oral Daily Alford Highland, MD   10 mg at 02/21/23 1034   ferrous sulfate tablet 325 mg  325 mg Oral Q breakfast Verdene Lennert, MD   325 mg at 02/22/23 9629   Gerhardt's butt cream   Topical Daily Vida Rigger, MD   Given at 02/22/23 0929   guaiFENesin (MUCINEX) 12 hr tablet 1,200 mg  1,200 mg Oral BID Vida Rigger, MD   1,200 mg at 02/22/23 5284   hydrOXYzine (ATARAX) tablet 25 mg  25 mg Oral TID PRN Alford Highland, MD   25 mg at 02/21/23 2220   Ipratropium-Albuterol (COMBIVENT) respimat 1 puff  1 puff Inhalation Q6H Vida Rigger, MD   1 puff at 02/22/23 0929   ondansetron (ZOFRAN) injection 4 mg  4 mg Intravenous Q6H PRN Verdene Lennert, MD       Oral care mouth rinse  15 mL Mouth Rinse PRN Darlin Priestly, MD       polyethylene glycol (MIRALAX / GLYCOLAX) packet 17 g  17 g Oral BID [name], MD   17 g at 02/21/23 1034   spironolactone (ALDACTONE) tablet 25 mg  25 mg Oral Daily Vida Rigger, MD   25  mg at 02/22/23 0928   torsemide (DEMADEX) tablet 40 mg  40 mg Oral BID Wouk, Wilfred Curtis, MD       trolamine salicylate (ASPERCREME) 10 % cream   Topical PRN Manuela Schwartz, NP   Given at 01/19/23 [phone]     Discharge Medications: Please see discharge summary for a list of discharge medications.  Relevant Imaging Results:  Relevant Lab Results:   Additional Information SS# [id number] will need CPAP at night and Nasal Cannula at 4 liters during the day  Kamarrion Stfort Seward Carol, RN

## 2023-02-22 NOTE — Progress Notes (Signed)
   02/22/23 2300  BiPAP/CPAP/SIPAP  $ Non-Invasive Ventilator  Non-Invasive Vent Subsequent  BiPAP/CPAP/SIPAP Pt Type Adult  BiPAP/CPAP/SIPAP Resmed  Mask Type Nasal mask  Mask Size Large  Respiratory Rate 17 breaths/min  EPAP 10 cmH2O  Flow Rate 3 lpm  Patient Home Equipment No  Auto Titrate No   Patient refused to wear full face mask, switched patient to nasal mask. Patient currently tolerating nasal mask at this time.

## 2023-02-22 NOTE — Plan of Care (Signed)
  Problem: Education: Goal: Knowledge of General Education information will improve Description: Including pain rating scale, medication(s)/side effects and non-pharmacologic comfort measures Outcome: Progressing   Problem: Activity: Goal: Risk for activity intolerance will decrease Outcome: Progressing   Problem: Nutrition: Goal: Adequate nutrition will be maintained Outcome: Progressing   Problem: Coping: Goal: Level of anxiety will decrease Outcome: Progressing   Problem: Pain Managment: Goal: General experience of comfort will improve Outcome: Progressing   

## 2023-02-22 NOTE — Plan of Care (Signed)

## 2023-02-23 DIAGNOSIS — I5033 Acute on chronic diastolic (congestive) heart failure: Secondary | ICD-10-CM | POA: Diagnosis not present

## 2023-02-23 NOTE — Plan of Care (Signed)
  Problem: Education: Goal: Knowledge of General Education information will improve Description: Including pain rating scale, medication(s)/side effects and non-pharmacologic comfort measures Outcome: Progressing   Problem: Activity: Goal: Risk for activity intolerance will decrease Outcome: Progressing   Problem: Nutrition: Goal: Adequate nutrition will be maintained Outcome: Progressing   Problem: Coping: Goal: Level of anxiety will decrease Outcome: Progressing   Problem: Safety: Goal: Ability to remain free from injury will improve Outcome: Progressing   

## 2023-02-23 NOTE — Plan of Care (Signed)

## 2023-02-23 NOTE — Progress Notes (Signed)
[facility]  - [place] [facility]   PATIENT NAME: Gary [name]    MR#:  [id number]  DATE OF BIRTH:  03-16-1956  SUBJECTIVE:  Reports breathing stable, working with mobility, tolerating diet,    VITALS:  Blood pressure 103/69, pulse 81, temperature (!) 97.5 F (36.4 C), resp. rate 18, height 6\' 4"  (1.93 m), weight (!) 181 kg, SpO2 92%.  PHYSICAL EXAMINATION:   GENERAL:  67 y.o.-year-old patient with no acute distress. Morbid obesity,  Trach   LUNGS: distant breath sounds bilaterally, no wheezing. Normal WOB CARDIOVASCULAR: S1, S2 normal. No murmur. Distant heart sounds  EXTREMITIES: trace edema b/l.    NEUROLOGIC: nonfocal  patient is alert and awake and moving all 4  LABORATORY PANEL:     Chemistries  Recent Labs  Lab 02/19/23 0906  NA 135  K 3.7  CL 100  CO2 28  GLUCOSE 121*  BUN 29*  CREATININE 1.40*  CALCIUM 8.6*     Acute on chronic diastolic CHF (congestive heart failure) ([name]) Continue torsemide , spironolactone.  Will increase torsemide back to bid which was home dosing, patient requests to go back on this says can sense lower extremity swelling - will plan on checking bmp 11/10   Acute on chronic respiratory failure with hypoxia and hypercapnia ([name]) Trach Dependent Pseudomonas tracheitis On trach collar. S/p abx course for pseudomonas tracheitis Also evaluated for non-massive hemoptysis with CT soft tissue of neck done on 02/08/2023 .  Tracheostomy tube is in place and there is no definite collection around the tube.  There is thinning of the wall between the esophagus and trachea with possible defect. Pulm following, currently on 2 liters  COPD (chronic obstructive pulmonary disease) ([name]) Severe COPD on Combivent, as needed albuterol.  On yuperli nebulizer.   Acute kidney injury superimposed on CKD ([name]) Creat stable at ~1.5 - monitor periodically   Anxiety On Atarax as needed.  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   Chronic colitis Stable Continue home mesalamine   Generalized weakness Continue working with physical therapy and mobility specialist to work on getting out of the bed and ambulating.  Continue also doing exercises in the bed.   Morbid obesity with BMI of 50.0-59.9, adult ([name]) Complicates above care  Family communication : none Consults : pulmonary CODE STATUS: full DVT Prophylaxis : Lovenox Level of care: Med-Surg Status is: Inpatient Remains inpatient appropriate because: TOC for d/c planning      TOTAL TIME TAKING CARE OF THIS PATIENT: 20 minutes.   Silvano Bilis M.D    [facility]

## 2023-02-24 DIAGNOSIS — I5033 Acute on chronic diastolic (congestive) heart failure: Secondary | ICD-10-CM | POA: Diagnosis not present

## 2023-02-24 MED ORDER — LACTULOSE 10 GM/15ML PO SOLN: 10.0000 g | Freq: Once | ORAL | Status: DC

## 2023-02-24 MED ORDER — LACTULOSE 10 GM/15ML PO SOLN
30.0000 g | Freq: Every day | ORAL | Status: DC | PRN
  Administered 2023-02-26 – 2023-03-30 (×5): 30 g via ORAL
  Filled 2023-02-24 (×6): qty 60

## 2023-02-24 NOTE — Progress Notes (Signed)
[facility]  - [place] [facility]   PATIENT NAME: Gary [name]    MR#:  [id number]  DATE OF BIRTH:  1956/03/29  SUBJECTIVE:  Reports breathing stable, working with mobility, tolerating diet,  bm this morning  VITALS:  Blood pressure 92/62, pulse 82, temperature 98.5 F (36.9 C), resp. rate 16, height 6\' 4"  (1.93 m), weight (!) 181 kg, SpO2 95%.  PHYSICAL EXAMINATION:   GENERAL:  67 y.o.-year-old patient with no acute distress. Morbid obesity,  Trach   LUNGS: distant breath sounds bilaterally, no wheezing. Normal WOB CARDIOVASCULAR: S1, S2 normal. No murmur. Distant heart sounds  EXTREMITIES: trace edema b/l.    NEUROLOGIC: nonfocal  patient is alert and awake and moving all 4  LABORATORY PANEL:     Chemistries  Recent Labs  Lab 02/19/23 0906  NA 135  K 3.7  CL 100  CO2 28  GLUCOSE 121*  BUN 29*  CREATININE 1.40*  CALCIUM 8.6*     Acute on chronic diastolic CHF (congestive heart failure) ([name]) Continue torsemide , spironolactone.  Will increase torsemide back to bid which was home dosing, patient requests to go back on this says can sense lower extremity swelling - will plan on checking bmp tomorrow   Acute on chronic respiratory failure with hypoxia and hypercapnia ([name]) Trach Dependent Pseudomonas tracheitis On trach collar. S/p abx course for pseudomonas tracheitis Also evaluated for non-massive hemoptysis with CT soft tissue of neck done on 02/08/2023 .  Tracheostomy tube is in place and there is no definite collection around the tube.  There is thinning of the wall between the esophagus and trachea with possible defect. Pulm following, currently on 2 liters  COPD (chronic obstructive pulmonary disease) ([name]) Severe COPD on Combivent, as needed albuterol.  On yuperli nebulizer.   Acute kidney injury superimposed on CKD ([name]) Creat stable at ~1.5 - monitor periodically   Anxiety On Atarax as needed.  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   Chronic colitis Stable Continue home mesalamine   Generalized weakness Continue working with physical therapy and mobility specialist to work on getting out of the bed and ambulating.  Continue also doing exercises in the bed.   Morbid obesity with BMI of 50.0-59.9, adult ([name]) Complicates above care  Constipation Reports bm this morning but ongoing generalized constipation, requests adding lactulose to current regiment - add lactulose  Family communication : none Consults : pulmonary CODE STATUS: full DVT Prophylaxis : Lovenox Level of care: Med-Surg Status is: Inpatient Remains inpatient appropriate because: TOC for d/c planning      TOTAL TIME TAKING CARE OF THIS PATIENT: 20 minutes.   Silvano Bilis M.D    [facility]

## 2023-02-24 NOTE — Plan of Care (Signed)
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
## 2023-02-25 DIAGNOSIS — J9601 Acute respiratory failure with hypoxia: Secondary | ICD-10-CM | POA: Diagnosis not present

## 2023-02-25 DIAGNOSIS — J441 Chronic obstructive pulmonary disease with (acute) exacerbation: Secondary | ICD-10-CM | POA: Diagnosis not present

## 2023-02-25 DIAGNOSIS — J9602 Acute respiratory failure with hypercapnia: Secondary | ICD-10-CM | POA: Diagnosis not present

## 2023-02-25 DIAGNOSIS — E8770 Fluid overload, unspecified: Secondary | ICD-10-CM | POA: Diagnosis not present

## 2023-02-25 DIAGNOSIS — I5033 Acute on chronic diastolic (congestive) heart failure: Secondary | ICD-10-CM | POA: Diagnosis not present

## 2023-02-25 LAB — BASIC METABOLIC PANEL
Anion gap: 10 (ref 5–15)
BUN: 36 mg/dL — ABNORMAL HIGH (ref 8–23)
CO2: 30 mmol/L (ref 22–32)
Calcium: 8.5 mg/dL — ABNORMAL LOW (ref 8.9–10.3)
Chloride: 98 mmol/L (ref 98–111)
Creatinine, Ser: 1.52 mg/dL — ABNORMAL HIGH (ref 0.61–1.24)
GFR, Estimated: 51 mL/min — ABNORMAL LOW (ref >60)
Glucose, Bld: 161 mg/dL — ABNORMAL HIGH (ref 70–99)
Potassium: 3.5 mmol/L (ref 3.5–5.1)
Sodium: 138 mmol/L (ref 135–145)

## 2023-02-25 LAB — CBC
HCT: 40.4 % (ref 39.0–52.0)
Hemoglobin: 11.2 g/dL — ABNORMAL LOW (ref 13.0–17.0)
MCH: 21.4 pg — ABNORMAL LOW (ref 26.0–34.0)
MCHC: 27.7 g/dL — ABNORMAL LOW (ref 30.0–36.0)
MCV: 77.2 fL — ABNORMAL LOW (ref 80.0–100.0)
Platelets: 183 10*3/uL (ref 150–400)
RBC: 5.23 MIL/uL (ref 4.22–5.81)
RDW: 18.2 % — ABNORMAL HIGH (ref 11.5–15.5)
WBC: 7.7 10*3/uL (ref 4.0–10.5)
nRBC: 0 % (ref 0.0–0.2)
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
## 2023-02-25 NOTE — TOC Progression Note (Addendum)
Transition of Care [facility]) - Progression Note    Patient Details  Name: Gary [name] MRN: [id number] Date of Birth: 1955-11-19  Transition of Care [facility]) CM/SW Contact  Marlowe Sax, RN Phone Number: 02/25/2023, 3:11 PM  Clinical Narrative:     Ins approved to go to [facility] and rehab ref number 11/12-14 [id number]  Expected Discharge Plan: Home w Home Health Services Barriers to Discharge: Continued Medical Work up  Expected Discharge Plan and Services     Post Acute Care Choice: Home Health                   DME Arranged: Bedside commode DME Agency: [employer] Date DME Agency Contacted: 01/12/23 Time DME Agency Contacted: 1427 Representative spoke with at DME Agency: [facility] Arranged: PT, OT HH Agency: [facility] Date [facility] Agency Contacted: 01/12/23 Time HH Agency Contacted: 1428 Representative spoke with at [facility] Agency: Kandee Keen   Social Determinants of Health (SDOH) Interventions SDOH Screenings   Food Insecurity: No Food Insecurity (12/01/2022)  Housing: Patient Declined (12/01/2022)  Transportation Needs: No Transportation Needs (12/01/2022)  Utilities: Not At Risk (12/01/2022)  Depression (PHQ2-9): Low Risk  (06/07/2021)  Financial Resource Strain: High Risk (11/21/2022)   Received from [facility]  Physical Activity: Insufficiently Active (05/03/2017)  Social Connections: Patient Declined (11/20/2022)   Received from [facility]  Stress: Stress Concern Present (11/20/2022)   Received from [facility]  Tobacco Use: Medium Risk (02/13/2023)    Readmission Risk Interventions    11/05/2022    2:59 PM 11/05/2022   12:16 PM 12/31/2021    8:58 AM  Readmission Risk Prevention Plan  Transportation Screening Complete Complete Complete  Medication Review Ocean[employer]) Complete Complete Complete  PCP or Specialist appointment within 3-5 days of discharge Complete Complete Complete  HRI or Home Care Consult Complete Complete Complete  SW Recovery  Care/Counseling Consult Complete Complete Complete  Palliative Care Screening Complete Complete Complete  Skilled Nursing Facility Not Applicable Not Applicable Not Applicable

## 2023-02-25 NOTE — Progress Notes (Signed)
Physical Therapy Treatment Patient Details Name: Gary [name] MRN: [id number] DOB: [birth year] Today's Date: 02/25/2023   History of Present Illness 67 y/o male presented to [facility] ED on 11/28/22 for chest pain x 4 days. Admitted for acute on chronic CHF. Frequent admissions this year with most recent dsicharge on 11/21/22. PMH includes obesity, hypoxia, respiratory failure with tracheostomy, COPD, GIB, HFpEF.    PT Comments  Patient remains motivated and agreeable to participation with session; continues to demonstrate strong tendency/preference for self-directing treatment session and individual preferences throughout session.  Able to complete sit/stand, basic transfers and gait (100') with RW, cga/close sup; very heavily reliant on RW for balance, endurance and overall support.  Remains globally unable to stand (or control descent) to/from surfaces less than 28" tall, requiring surface elevation of use of lift to complete transitions.  POC and goals remain appropriate for patient; emphasis on progression towards transfers that will facilitate/simulate return home.  Goals extended to reflect.    If plan is discharge home, recommend the following: A lot of help with bathing/dressing/bathroom;Assist for transportation;A lot of help with walking and/or transfers;A little help with walking and/or transfers   Can travel by private vehicle     No  Equipment Recommendations       Recommendations for Other Services       Precautions / Restrictions Precautions Precautions: Fall Precaution Comments: trach collar/ Trach Restrictions Weight Bearing Restrictions: No RLE Weight Bearing: Weight bearing as tolerated     Mobility  Bed Mobility Overal bed mobility: Needs Assistance Bed Mobility: Supine to Sit     Supine to sit: Supervision     General bed mobility comments: heavy use of momentum and bed features (elevated HOB, bedrails), but able to complete without physical assist from  therapist    Transfers Overall transfer level: Needs assistance Equipment used: Rolling walker (2 wheels) Transfers: Sit to/from Stand Sit to Stand: Supervision           General transfer comment: prefers/requires UEs on RW to complete movement transition; elevated bed height required    Ambulation/Gait Ambulation/Gait assistance: Contact guard assist Gait Distance (Feet): 100 Feet Assistive device: Rolling walker (2 wheels)         General Gait Details: mild forward trunk flexion, heavy WBing bilat UEs; broad BOS with excessive bilat LE ER throughout gait cycle.  Limited dynamic balance reactions, requiring heavy WBing/stabilization in bilat UEs for support.  Multiple standing rest breaks required due to fatigue/SOB; sats >98% on supplemental O2 via [place] with gait.   Stairs             Wheelchair Mobility     Tilt Bed    Modified Rankin (Stroke Patients Only)       Balance Overall balance assessment: Needs assistance Sitting-balance support: No upper extremity supported, Feet supported Sitting balance-[name] Scale: Normal     Standing balance support: Bilateral upper extremity supported Standing balance-[name] Scale: Fair Standing balance comment: Reliant on RW/AD for all standing activity.                            Cognition Arousal: Alert Behavior During Therapy: WFL for tasks assessed/performed Overall Cognitive Status: Within Functional Limits for tasks assessed                                 General Comments: Generally self-directive of session and care,  but pleasant and cooperative throughout if allowed to 'lead' the session        Exercises Other Exercises Other Exercises: Reviewed/encouraged weight shifting and chair push-ups in chair to progress towards sit/stand from lower seat heights; patient voiced understanding.    General Comments        Pertinent Vitals/Pain Pain Assessment Pain Assessment: No/denies  pain    Home Living                          Prior Function            PT Goals (current goals can now be found in the care plan section) Acute Rehab PT Goals Patient Stated Goal: Get better so I can get out of here." PT Goal Formulation: With patient Time For Goal Achievement: 03/11/23 Potential to Achieve Goals: Good Progress towards PT goals: Progressing toward goals    Frequency    Min 1X/week      PT Plan Current plan remains appropriate    Co-evaluation PT/OT/SLP Co-Evaluation/Treatment: Yes Reason for Co-Treatment: To address functional/ADL transfers PT goals addressed during session: Mobility/safety with mobility;Proper use of DME;Strengthening/ROM;Balance OT goals addressed during session: ADL's and self-care      AM-PAC PT "6 Clicks" Mobility   Outcome Measure  Help needed turning from your back to your side while in a flat bed without using bedrails?: None Help needed moving from lying on your back to sitting on the side of a flat bed without using bedrails?: None Help needed moving to and from a bed to a chair (including a wheelchair)?: A Lot Help needed standing up from a chair using your arms (e.g., wheelchair or bedside chair)?: A Little Help needed to walk in hospital room?: A Little Help needed climbing 3-5 steps with a railing? : Total 6 Click Score: 17    End of Session Equipment Utilized During Treatment: Oxygen Activity Tolerance: Patient tolerated treatment well Patient left: in chair;with call bell/phone within reach Nurse Communication: Mobility status PT Visit Diagnosis: Muscle weakness (generalized) (M62.81);Difficulty in walking, not elsewhere classified (R26.2)     Time: 1610-9604 PT Time Calculation (min) (ACUTE ONLY): 26 min  Charges:    $Gait Training: 8-22 mins PT General Charges $$ ACUTE PT VISIT: 1 Visit                    [name] H. Manson Passey, PT, DPT, NCS 02/25/23, 4:33 PM [phone]

## 2023-02-25 NOTE — TOC Progression Note (Addendum)
Transition of Care [facility]) - CM/SW Discharge Note   Patient Details  Name: Gary [name] MRN: [id number] Date of Birth: 1955/08/08  Transition of Care [facility]) CM/SW Contact:  Marlowe Sax, RN Phone Number: 02/25/2023, 2:33 PM   Clinical Narrative:    Met with the patient and reviewed the bed offers for him, he chose [name], Ins pending     Barriers to Discharge: Continued Medical Work up   Patient Goals and CMS Choice      Discharge Placement                         Discharge Plan and Services Additional resources added to the After Visit Summary for       Post Acute Care Choice: Home Health          DME Arranged: Bedside commode DME Agency: [employer] Date DME Agency Contacted: 01/12/23 Time DME Agency Contacted: 1427 Representative spoke with at DME Agency: [facility] Arranged: PT, OT HH Agency: [facility] Date [facility] Agency Contacted: 01/12/23 Time HH Agency Contacted: 1428 Representative spoke with at [facility] Agency: Kandee Keen  Social Determinants of Health (SDOH) Interventions SDOH Screenings   Food Insecurity: No Food Insecurity (12/01/2022)  Housing: Patient Declined (12/01/2022)  Transportation Needs: No Transportation Needs (12/01/2022)  Utilities: Not At Risk (12/01/2022)  Depression (PHQ2-9): Low Risk  (06/07/2021)  Financial Resource Strain: High Risk (11/21/2022)   Received from [facility]  Physical Activity: Insufficiently Active (05/03/2017)  Social Connections: Patient Declined (11/20/2022)   Received from [facility]  Stress: Stress Concern Present (11/20/2022)   Received from [facility]  Tobacco Use: Medium Risk (02/13/2023)     Readmission Risk Interventions    11/05/2022    2:59 PM 11/05/2022   12:16 PM 12/31/2021    8:58 AM  Readmission Risk Prevention Plan  Transportation Screening Complete Complete Complete  Medication Review Oceanographer) Complete Complete Complete  PCP or Specialist appointment within 3-5 days of  discharge Complete Complete Complete  HRI or Home Care Consult Complete Complete Complete  SW Recovery Care/Counseling Consult Complete Complete Complete  Palliative Care Screening Complete Complete Complete  Skilled Nursing Facility Not Applicable Not Applicable Not Applicable

## 2023-02-25 NOTE — Progress Notes (Signed)
[facility]  - [place] at [facility]   PATIENT NAME: Gary [name]    MR#:  [id number]  DATE OF BIRTH:  May 02, 1955  SUBJECTIVE:  Reports breathing stable, working with mobility, tolerating diet,  bm yesterday, up in chair currently  VITALS:  Blood pressure 102/68, pulse 79, temperature 98.1 F (36.7 C), temperature source Oral, resp. rate 17, height 6\' 4"  (1.93 m), weight (!) 181 kg, SpO2 97%.  PHYSICAL EXAMINATION:   GENERAL:  67 y.o.-year-old patient with no acute distress. Morbid obesity,  Trach   LUNGS: distant breath sounds bilaterally, no wheezing. Normal WOB CARDIOVASCULAR: S1, S2 normal. No murmur. Distant heart sounds  EXTREMITIES: trace edema b/l.    NEUROLOGIC: nonfocal  patient is alert and awake and moving all 4  LABORATORY PANEL:     Chemistries  Recent Labs  Lab 02/25/23 0835  NA 138  K 3.5  CL 98  CO2 30  GLUCOSE 161*  BUN 36*  CREATININE 1.52*  CALCIUM 8.5*     Acute on chronic diastolic CHF (congestive heart failure) ([name]) Continue torsemide , spironolactone.  Will increase torsemide back to bid which was home dosing, patient requests to go back on this says can sense lower extremity swelling. Bmp today stable.   Acute on chronic respiratory failure with hypoxia and hypercapnia ([name]) Trach Dependent Pseudomonas tracheitis On trach collar. S/p abx course for pseudomonas tracheitis Also evaluated for non-massive hemoptysis with CT soft tissue of neck done on 02/08/2023 .  Tracheostomy tube is in place and there is no definite collection around the tube.  There is thinning of the wall between the esophagus and trachea with possible defect. Pulm following, currently stable on 2 liters  COPD (chronic obstructive pulmonary disease) ([name]) Severe COPD on Combivent, as needed albuterol.  On yuperli nebulizer.   Acute kidney injury superimposed on CKD ([name]) Creat stable at ~1.5 - monitor periodically   Anxiety On Atarax as needed.   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   Chronic colitis Stable Continue home mesalamine   Generalized weakness Continue working with physical therapy and mobility specialist to work on getting out of the bed and ambulating.  Continue also doing exercises in the bed.   Morbid obesity with BMI of 50.0-59.9, adult ([name]) Complicates above care  Constipation Reports bm this morning but ongoing generalized constipation, requests adding lactulose to current regiment - added lactulose  Family communication : none Consults : pulmonary CODE STATUS: full DVT Prophylaxis : Lovenox Level of care: Med-Surg Status is: Inpatient Remains inpatient appropriate because: pending insurance authorization for skilled nursing      TOTAL TIME TAKING CARE OF THIS PATIENT: 20 minutes.   Silvano Bilis M.D    [facility]

## 2023-02-25 NOTE — Progress Notes (Signed)
Mobility Specialist - Progress Note   02/25/23 1624  Mobility  Activity Transferred from chair to bed  Level of Assistance +2 (takes two people)  Assistive Device  Water quality scientist)  Activity Response Tolerated well  $Mobility charge 1 Mobility  Mobility Specialist Start Time (ACUTE ONLY) 1505  Mobility Specialist Stop Time (ACUTE ONLY) 1526  Mobility Specialist Time Calculation (min) (ACUTE ONLY) 21 min   Pt sitting in the recliner upon entry, utilizing 2L [place]. Pt transferred chair to bed via [facility] lift +2 for safety. Pt left in fowler position with needs within reach.  Zetta Bills Mobility Specialist 02/25/23 4:25 PM

## 2023-02-25 NOTE — Progress Notes (Signed)
Occupational Therapy Treatment Patient Details Name: Gary [name] MRN: [id number] DOB: [birth year] Today's Date: 02/25/2023   History of present illness 67 y/o male presented to [facility] ED on 11/28/22 for chest pain x 4 days. Admitted for acute on chronic CHF. Frequent admissions this year with most recent dsicharge on 11/21/22. PMH includes obesity, hypoxia, respiratory failure with tracheostomy, COPD, GIB, HFpEF.   OT comments  Gary [name] was seen for OT/PT treatment on this date. Upon arrival to room pt reclined in bed, agreeable to tx. Pt requires no assist for bed mobility using bed controls and rail. SBA + RW for ADL t/f, tolerates ~100 ft mobility. Reports increased shortness and work of breath however SpO2 98% on 6L [place]. Pt making progress toward goals, will continue to follow POC. Discharge recommendation remains appropriate.        If plan is discharge home, recommend the following:  A little help with walking and/or transfers;A lot of help with bathing/dressing/bathroom   Equipment Recommendations  BSC/3in1    Recommendations for Other Services      Precautions / Restrictions Precautions Precautions: Fall Restrictions Weight Bearing Restrictions: No       Mobility Bed Mobility Overal bed mobility: Needs Assistance Bed Mobility: Supine to Sit     Supine to sit: Supervision          Transfers Overall transfer level: Needs assistance Equipment used: Rolling walker (2 wheels) Transfers: Sit to/from Stand Sit to Stand: Supervision           General transfer comment: Pt continues to request heightened bed surface to stand     Balance Overall balance assessment: Needs assistance Sitting-balance support: Feet supported, No upper extremity supported Sitting balance-[name] Scale: Normal     Standing balance support: Bilateral upper extremity supported Standing balance-[name] Scale: Fair                             ADL either performed or assessed  with clinical judgement   ADL Overall ADL's : Needs assistance/impaired                                       General ADL Comments: MAX A don B shoes in sitting      Cognition Arousal: Alert Behavior During Therapy: WFL for tasks assessed/performed Overall Cognitive Status: Within Functional Limits for tasks assessed                                                     Pertinent Vitals/ Pain       Pain Assessment Pain Assessment: No/denies pain   Frequency  Min 1X/week        Progress Toward Goals  OT Goals(current goals can now be found in the care plan section)  Progress towards OT goals: Progressing toward goals  Acute Rehab OT Goals Patient Stated Goal: to walk OT Goal Formulation: With patient Time For Goal Achievement: 02/28/23 Potential to Achieve Goals: Good ADL Goals Pt Will Perform Grooming: with modified independence;standing Pt Will Transfer to Toilet: with modified independence;ambulating;regular height toilet Pt Will Perform Toileting - Clothing Manipulation and hygiene: with mod assist;sit to/from stand Additional ADL Goal #1: Pt will stand from the hospital  bed or recliner adn LR DME with min A as a precursor to ADLs  Plan Discharge plan remains appropriate;Frequency remains appropriate    Co-evaluation    PT/OT/SLP Co-Evaluation/Treatment: Yes Reason for Co-Treatment: To address functional/ADL transfers PT goals addressed during session: Mobility/safety with mobility;Proper use of DME;Strengthening/ROM;Balance OT goals addressed during session: ADL's and self-care      AM-PAC OT "6 Clicks" Daily Activity     Outcome Measure   Help from another person eating meals?: None Help from another person taking care of personal grooming?: A Little Help from another person toileting, which includes using toliet, bedpan, or urinal?: A Lot Help from another person bathing (including washing, rinsing, drying)?: A  Lot Help from another person to put on and taking off regular upper body clothing?: None Help from another person to put on and taking off regular lower body clothing?: A Lot 6 Click Score: 17    End of Session Equipment Utilized During Treatment: Oxygen;Rolling walker (2 wheels)  OT Visit Diagnosis: Unsteadiness on feet (R26.81);Other abnormalities of gait and mobility (R26.89);Muscle weakness (generalized) (M62.81);Adult, failure to thrive (R62.7)   Activity Tolerance Patient tolerated treatment well   Patient Left in chair;with call bell/phone within reach   Nurse Communication Mobility status        Time: 6045-4098 OT Time Calculation (min): 26 min  Charges: OT General Charges $OT Visit: 1 Visit OT Treatments $Self Care/Home Management : 8-22 mins  Kathie Dike, M.S. OTR/L  02/25/23, 2:15 PM  ascom [phone]

## 2023-02-25 NOTE — TOC Progression Note (Addendum)
Transition of Care [facility]) - Progression Note    Patient Details  Name: Gary [name] MRN: [id number] Date of Birth: 24-Mar-1956  Transition of Care [facility]) CM/SW Contact  Marlowe Sax, RN Phone Number: 02/25/2023, 1:58 PM  Clinical Narrative:     Met with the patient in the  room, he is sitting in the chair with a [place] for Oxygen, he stated he is slowly getting better, we reviewed the bed offers to go to [facility], he is agreeable to go to [facility] in [place], I notified [name] and Ins was approved   Expected Discharge Plan: Home w Home Health Services Barriers to Discharge: Continued Medical Work up  Expected Discharge Plan and Services     Post Acute Care Choice: Home Health                   DME Arranged: Bedside commode DME Agency: [employer] Date DME Agency Contacted: 01/12/23 Time DME Agency Contacted: 1427 Representative spoke with at DME Agency: [facility] HH Arranged: PT, OT HH Agency: [facility] Date [facility] Agency Contacted: 01/12/23 Time HH Agency Contacted: 1428 Representative spoke with at [facility] Agency: [facility]   Social Determinants of Health (SDOH) Interventions SDOH Screenings   Food Insecurity: No Food Insecurity (12/01/2022)  Housing: Patient Declined (12/01/2022)  Transportation Needs: No Transportation Needs (12/01/2022)  Utilities: Not At Risk (12/01/2022)  Depression (PHQ2-9): Low Risk  (06/07/2021)  Financial Resource Strain: High Risk (11/21/2022)   Received [facility]  Physical Activity: Insufficiently Active (05/03/2017)  Social Connections: Patient Declined (11/20/2022)   Received from [facility]  Stress: Stress Concern Present (11/20/2022)   Received from [facility]  Tobacco Use: Medium Risk (02/13/2023)    Readmission Risk Interventions    11/05/2022    2:59 PM 11/05/2022   12:16 PM 12/31/2021    8:58 AM  Readmission Risk Prevention Plan  Transportation Screening Complete Complete Complete  Medication Review Oceanographer)  Complete Complete Complete  PCP or Specialist appointment within 3-5 days of discharge Complete Complete Complete  HRI or Home Care Consult Complete Complete Complete  SW Recovery Care/Counseling Consult Complete Complete Complete  Palliative Care Screening Complete Complete Complete  Skilled Nursing Facility Not Applicable Not Applicable Not Applicable

## 2023-02-26 DIAGNOSIS — E8770 Fluid overload, unspecified: Secondary | ICD-10-CM | POA: Diagnosis not present

## 2023-02-26 DIAGNOSIS — J9602 Acute respiratory failure with hypercapnia: Secondary | ICD-10-CM | POA: Diagnosis not present

## 2023-02-26 DIAGNOSIS — J0410 Acute tracheitis without obstruction: Secondary | ICD-10-CM | POA: Insufficient documentation

## 2023-02-26 DIAGNOSIS — J9601 Acute respiratory failure with hypoxia: Secondary | ICD-10-CM | POA: Diagnosis not present

## 2023-02-26 DIAGNOSIS — I5033 Acute on chronic diastolic (congestive) heart failure: Secondary | ICD-10-CM | POA: Diagnosis not present

## 2023-02-26 DIAGNOSIS — J441 Chronic obstructive pulmonary disease with (acute) exacerbation: Secondary | ICD-10-CM | POA: Diagnosis not present

## 2023-02-26 LAB — BASIC METABOLIC PANEL
Anion gap: 9 (ref 5–15)
BUN: 38 mg/dL — ABNORMAL HIGH (ref 8–23)
CO2: 29 mmol/L (ref 22–32)
Calcium: 8.6 mg/dL — ABNORMAL LOW (ref 8.9–10.3)
Chloride: 99 mmol/L (ref 98–111)
Creatinine, Ser: 1.53 mg/dL — ABNORMAL HIGH (ref 0.61–1.24)
GFR, Estimated: 50 mL/min — ABNORMAL LOW (ref >60)
Glucose, Bld: 100 mg/dL — ABNORMAL HIGH (ref 70–99)
Potassium: 3.8 mmol/L (ref 3.5–5.1)
Sodium: 137 mmol/L (ref 135–145)

## 2023-02-26 MED ORDER — LACTULOSE 10 GM/15ML PO SOLN: 30.0000 g | Freq: Every day | ORAL | Status: DC | PRN

## 2023-02-26 MED ORDER — DOCUSATE SODIUM 100 MG PO CAPS: 100.0000 mg | ORAL_CAPSULE | Freq: Every day | ORAL | Status: DC

## 2023-02-26 MED ORDER — SPIRONOLACTONE 25 MG PO TABS: 25.0000 mg | ORAL_TABLET | Freq: Every day | ORAL | Status: DC

## 2023-02-26 MED ORDER — ESCITALOPRAM OXALATE 10 MG PO TABS: 10.0000 mg | ORAL_TABLET | Freq: Every day | ORAL | Status: DC

## 2023-02-26 NOTE — TOC Progression Note (Signed)
Transition of Care [facility]) - Progression Note    Patient Details  Name: Gary [name] MRN: [id number] Date of Birth: [birth year]  Transition of Care [facility]) CM/SW Contact  Marlowe Sax, RN Phone Number: 02/26/2023, 2:30 PM  Clinical Narrative:    [facility] called back and stated as long as he has a trach collar they would not accept him   Expected Discharge Plan: Home w Home Health Services Barriers to Discharge: Continued Medical Work up  Expected Discharge Plan and Services     Post Acute Care Choice: Home Health   Expected Discharge Date: 02/26/23               DME Arranged: Bedside commode DME Agency: [employer] Date DME Agency Contacted: 01/12/23 Time DME Agency Contacted: 1427 Representative spoke with at DME Agency: [facility] HH Arranged: PT, OT HH Agency: [facility] Date [facility] Agency Contacted: 01/12/23 Time HH Agency Contacted: 1428 Representative spoke with at [facility] Agency: [facility] Keen   Social Determinants of Health (SDOH) Interventions SDOH Screenings   Food Insecurity: No Food Insecurity (12/01/2022)  Housing: Patient Declined (12/01/2022)  Transportation Needs: No Transportation Needs (12/01/2022)  Utilities: Not At Risk (12/01/2022)  Depression (PHQ2-9): Low Risk  (06/07/2021)  Financial Resource Strain: High Risk (11/21/2022)   Received from [facility] Care  Physical Activity: Insufficiently Active (05/03/2017)  Social Connections: Patient Declined (11/20/2022)   Received from [facility]  Stress: Stress Concern Present (11/20/2022)   Received from [facility]  Tobacco Use: Medium Risk (02/13/2023)    Readmission Risk Interventions    11/05/2022    2:59 PM 11/05/2022   12:16 PM 12/31/2021    8:58 AM  Readmission Risk Prevention Plan  Transportation Screening Complete Complete Complete  Medication Review Oceanographer) Complete Complete Complete  PCP or Specialist appointment within 3-5 days of discharge Complete Complete Complete  HRI or Home  Care Consult Complete Complete Complete  SW Recovery Care/Counseling Consult Complete Complete Complete  Palliative Care Screening Complete Complete Complete  Skilled Nursing Facility Not Applicable Not Applicable Not Applicable

## 2023-02-26 NOTE — Plan of Care (Signed)

## 2023-02-26 NOTE — TOC Progression Note (Signed)
Transition of Care [facility]) - Progression Note    Patient Details  Name: MARKIESE MAILE MRN: [id number] Date of Birth: 27-Jan-1956  Transition of Care [facility]) CM/SW Contact  Marlowe Sax, RN Phone Number: 02/26/2023, 11:19 AM  Clinical Narrative:     Patient discharging to [facility] and rehab room 1201 P, EMS called and arranged [place]   Expected Discharge Plan: Home w Home Health Services Barriers to Discharge: Continued Medical Work up  Expected Discharge Plan and Services     Post Acute Care Choice: Home Health   Expected Discharge Date: 02/26/23               DME Arranged: Bedside commode DME Agency: [employer] Date DME Agency Contacted: 01/12/23 Time DME Agency Contacted: 1427 Representative spoke with at DME Agency: [facility] HH Arranged: PT, OT HH Agency: [facility] Date [facility] Agency Contacted: 01/12/23 Time HH Agency Contacted: 1428 Representative spoke with at [facility] Agency: Kandee Keen   Social Determinants of Health (SDOH) Interventions SDOH Screenings   Food Insecurity: No Food Insecurity (12/01/2022)  Housing: Patient Declined (12/01/2022)  Transportation Needs: No Transportation Needs (12/01/2022)  Utilities: Not At Risk (12/01/2022)  Depression (PHQ2-9): Low Risk  (06/07/2021)  Financial Resource Strain: High Risk (11/21/2022)   Received from [facility] Care  Physical Activity: Insufficiently Active (05/03/2017)  Social Connections: Patient Declined (11/20/2022)   Received from [facility]  Stress: Stress Concern Present (11/20/2022)   Received from [facility]  Tobacco Use: Medium Risk (02/13/2023)    Readmission Risk Interventions    11/05/2022    2:59 PM 11/05/2022   12:16 PM 12/31/2021    8:58 AM  Readmission Risk Prevention Plan  Transportation Screening Complete Complete Complete  Medication Review Oceanographer) Complete Complete Complete  PCP or Specialist appointment within 3-5 days of discharge Complete Complete Complete  HRI or Home  Care Consult Complete Complete Complete  SW Recovery Care/Counseling Consult Complete Complete Complete  Palliative Care Screening Complete Complete Complete  Skilled Nursing Facility Not Applicable Not Applicable Not Applicable

## 2023-02-26 NOTE — Plan of Care (Signed)

## 2023-02-26 NOTE — Progress Notes (Signed)
OT Cancellation Note  Patient Details Name: KAMAI NOWICKI MRN: [id number] DOB: 1955/09/14   Cancelled Treatment:    Reason Eval/Treat Not Completed: Other (comment). Chart reviewed. On arrival pt reports need for BM, offered BSC t/f however pt deferred. Will re-attempt as able.  Kathie Dike, M.S. OTR/L  02/26/23, 2:28 PM  ascom [phone]

## 2023-02-26 NOTE — Care Management Important Message (Signed)
Important Message  Patient Details  Name: Gary [name] MRN: [id number] Date of Birth: March 27, 1956   Important Message Given:  Yes - Medicare IM     Olegario Messier A Marcelene Weidemann 02/26/2023, 11:36 AM

## 2023-02-26 NOTE — Progress Notes (Signed)
[facility]  - [place] at [facility]   PATIENT NAME: Gary [name]    MR#:  [id number]  DATE OF BIRTH:  01-23-1956  SUBJECTIVE:  Reports breathing stable, working with mobility, tolerating diet,  bm today  VITALS:  Blood pressure 98/71, pulse 91, temperature 98.6 F (37 C), resp. rate 17, height 6\' 4"  (1.93 m), weight (!) 181 kg, SpO2 95%.  PHYSICAL EXAMINATION:   GENERAL:  67 y.o.-year-old patient with no acute distress. Morbid obesity,  Trach   LUNGS: distant breath sounds bilaterally, no wheezing. Normal WOB CARDIOVASCULAR: S1, S2 normal. No murmur. Distant heart sounds  EXTREMITIES: trace edema b/l.    NEUROLOGIC: nonfocal  patient is alert and awake and moving all 4  LABORATORY PANEL:     Chemistries  Recent Labs  Lab 02/26/23 0850  NA 137  K 3.8  CL 99  CO2 29  GLUCOSE 100*  BUN 38*  CREATININE 1.53*  CALCIUM 8.6*     Acute on chronic diastolic CHF (congestive heart failure) ([name]) Continue torsemide , spironolactone.  have increased torsemide back to bid which was home dosing, patient requests to go back on this says can sense lower extremity swelling. Bmp stable   Acute on chronic respiratory failure with hypoxia and hypercapnia ([name]) Trach Dependent Pseudomonas tracheitis On trach collar. S/p abx course for pseudomonas tracheitis Also evaluated for non-massive hemoptysis with CT soft tissue of neck done on 02/08/2023 .  Tracheostomy tube is in place and there is no definite collection around the tube.  There is thinning of the wall between the esophagus and trachea with possible defect. Pulm following, currently stable on 2 liters  COPD (chronic obstructive pulmonary disease) ([name]) Severe COPD on Combivent, as needed albuterol.  On yuperli nebulizer.   Acute kidney injury superimposed on CKD ([name]) Creat stable at ~1.5 - monitor periodically   Anxiety On Atarax as needed.  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   Chronic colitis Stable Continue home mesalamine   Generalized weakness Continue working with physical therapy and mobility specialist to work on getting out of the bed and ambulating.  Continue also doing exercises in the bed.   Morbid obesity with BMI of 50.0-59.9, adult ([name]) Complicates above care  Constipation Bm this morning - continue current regimen  Family communication : none Consults : pulmonary CODE STATUS: full DVT Prophylaxis : Lovenox Level of care: Med-Surg Status is: Inpatient Remains inpatient appropriate because: pending snf placement. Was set to d/c to skilled nursing today but then the called [place] and said can't take a patient with a tracheostomy. Patient did receive other bed offers and TOC is checking to see if they will take the patient.      TOTAL TIME TAKING CARE OF THIS PATIENT: 20 minutes.   Silvano Bilis M.D    [facility]

## 2023-02-26 NOTE — TOC Progression Note (Addendum)
Transition of Care [facility]) - Progression Note    Patient Details  Name: Gary [name] MRN: [id number] Date of Birth: 10-Jul-1955  Transition of Care [facility]) CM/SW Contact  Marlowe Sax, RN Phone Number: 02/26/2023, 11:27 AM  Clinical Narrative:    I just got a call from the  [facility] and rehab facility, they said because the trach is still there even tho he is using a CPAP they will not take him cancelled EMS I called [facility] and got Kritsy Clemet VM, Left a secure VM asking if they can still accept the patient [facility] in [place] Left a secure VM asking if they can accept the patient with the trach collar in place not in use [facility] and asked for the admissions department, left a secure VM asking if they can accept the patient with the Trach collar in place and not in use, waiting to hear back, I resent out the bedsearch for the pending bed offers  and made the note again so that they will see the Stephannie Peters is in place   Expected Discharge Plan: Home w Home Health Services Barriers to Discharge: Continued Medical Work up  Expected Discharge Plan and Services     Post Acute Care Choice: Home Health   Expected Discharge Date: 02/26/23               DME Arranged: Bedside commode DME Agency: [employer] Date DME Agency Contacted: 01/12/23 Time DME Agency Contacted: 1427 Representative spoke with at DME Agency: [facility] Arranged: PT, OT HH Agency: [facility] Date [facility] Agency Contacted: 01/12/23 Time HH Agency Contacted: 1428 Representative spoke with at [facility] Agency: Kandee Keen   Social Determinants of Health (SDOH) Interventions SDOH Screenings   Food Insecurity: No Food Insecurity (12/01/2022)  Housing: Patient Declined (12/01/2022)  Transportation Needs: No Transportation Needs (12/01/2022)  Utilities: Not At Risk (12/01/2022)  Depression (PHQ2-9): Low Risk  (06/07/2021)  Financial Resource Strain: High Risk  (11/21/2022)   Received from [name]  Physical Activity: Insufficiently Active (05/03/2017)  Social Connections: Patient Declined (11/20/2022)   Received from [facility]  Stress: Stress Concern Present (11/20/2022)   Received from [facility]  Tobacco Use: Medium Risk (02/13/2023)    Readmission Risk Interventions    11/05/2022    2:59 PM 11/05/2022   12:16 PM 12/31/2021    8:58 AM  Readmission Risk Prevention Plan  Transportation Screening Complete Complete Complete  Medication Review Ocean[employer]) Complete Complete Complete  PCP or Specialist appointment within 3-5 days of discharge Complete Complete Complete  HRI or Home Care Consult Complete Complete Complete  SW Recovery Care/Counseling Consult Complete Complete Complete  Palliative Care Screening Complete Complete Complete  Skilled Nursing Facility Not Applicable Not Applicable Not Applicable

## 2023-02-26 NOTE — Discharge Summary (Signed)
Gary [name] GEX:[id number] DOB: 1955-04-22 DOA: 11/28/2022  PCP: Shayne Alken, MD  Admit date: 11/28/2022 Discharge date: 02/26/2023  Time spent: 35 minutes  Recommendations for Outpatient Follow-up:  ENT f/u for ongoing trach maintenance Check BMP in one week    Discharge Diagnoses:  Principal Problem:   Acute on chronic diastolic CHF (congestive heart failure) ([name]) Active Problems:   Acute on chronic respiratory failure with hypoxia and hypercapnia ([name])   COPD (chronic obstructive pulmonary disease) ([name])   Acute kidney injury superimposed on CKD ([name])   OSA (obstructive sleep apnea)   Anxiety   Hypotension   Chest pain   Morbid obesity with BMI of 50.0-59.9, adult ([name])   Generalized weakness   Chronic colitis   Fluid overload   Eyelid cyst, right   Dry skin   Tracheitis   Discharge Condition: stable  Diet recommendation: heart healthy  Filed Weights    History of present illness:  From admission h and p Gary [name] is a 67 y.o. 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   Gary [name] states that he ran out of his home torsemide several days ago due to a mixup with his pharmacy.  Then over the last couple days, he has noticed increasing lower extremity swelling, shortness of breath and chest pain.  He states that he usually develops chest pain when he has "fluid on his lungs."  He notes that his girlfriend feels that his face has even become more edematous.  During this time, he noticed some edema in his bilateral hands that was much worse on the left hand with associated numbness and tingling but this has improved.  He denies any nausea, vomiting, abdominal pain.   Gary [name] states that at 1 point, his supplemental oxygen level was as low as 6, but  more recently he wears between 6-8L depending on how he feels.  Hospital Course:   Extended hospital course secondary to placement issues, resolved when we were able to wean him off NIV (which SNF will not take) and instead use cpap. He was declined by long-term care. He has had frequent admissions and remains at high risk for readmission.  Acute on chronic respiratory failure with hypoxia and hypercapnia ([name]) Trach Dependent Pseudomonas tracheitis Treated for pseudomonas tracheitis, now resolved On trach collar.   Also evaluated for non-massive hemoptysis with CT soft tissue of neck done on 02/08/2023 .  Tracheostomy tube is in place and there is no definite collection around the tube.  There is thinning of the wall between the esophagus and trachea with possible defect. Pulm following, currently stable on 2 liters Outpatient ENT f/u  Acute on chronic diastolic CHF (congestive heart failure) ([name]) Compensated. Continue torsemide , spironolactone.     COPD (chronic obstructive pulmonary disease) ([name]) Severe COPD on Combivent, as needed albuterol.  On yuperli.   Acute kidney injury superimposed on CKD ([name]) Creat stable at ~1.5 - monitor periodically   Anxiety ontinue Lexapro   OSA (obstructive sleep apnea) On niv at bedtime at baseline, snf won't take this but till take cpap, pulm thinks this is sufficient, now on cpap and tolerating that   Eyelid cyst, right Lanced by ophthalmology, stable   Chronic colitis Stable Continue home mesalamine   Generalized weakness D/c to snf  Morbid obesity with BMI of 50.0-59.9, adult ([name]) Complicates above care   Constipation Stable, had BM in the last 24  Procedures: none   Consultations: pulmonology  Discharge Exam: Vitals:   02/25/23 1555 02/26/23 0740  BP: 119/81 108/71  Pulse: 86 81  Resp: 16 17  Temp: 98.5 F (36.9 C)   SpO2: 96% 94%    GENERAL:  67 y.o.-year-old patient with no acute distress. Morbid obesity,   Trach   LUNGS: distant breath sounds bilaterally, no wheezing. Normal WOB CARDIOVASCULAR: S1, S2 normal. No murmur. Distant heart sounds  EXTREMITIES: trace edema b/l.    NEUROLOGIC: nonfocal  patient is alert and awake and moving all 4  Discharge Instructions   Discharge Instructions     Diet - low sodium heart healthy   Complete by: As directed    Discharge wound care:   Complete by: As directed    Standard pressure ulcer wound care   Increase activity slowly   Complete by: As directed       Allergies as of 02/26/2023   No Known Allergies      Medication List     STOP taking these medications    arformoterol 15 MCG/2ML Nebu Commonly known as: BROVANA   midodrine 10 MG tablet Commonly known as: PROAMATINE   potassium chloride SA 20 MEQ tablet Commonly known as: KLOR-CON M   predniSONE 10 MG tablet Commonly known as: DELTASONE   traMADol 50 MG tablet Commonly known as: ULTRAM       TAKE these medications    acetaminophen 325 MG tablet Commonly known as: TYLENOL Take 2 tablets (650 mg total) by mouth every 6 (six) hours as needed for mild pain (or Fever >/= 101).   apixaban 2.5 MG Tabs tablet Commonly known as: ELIQUIS Take 1 tablet (2.5 mg total) by mouth 2 (two) times daily.   budesonide 0.5 MG/2ML nebulizer solution Commonly known as: PULMICORT Take 2 mLs (0.5 mg total) by nebulization 2 (two) times daily.   docusate sodium 100 MG capsule Commonly known as: COLACE Take 1 capsule (100 mg total) by mouth daily.   escitalopram 10 MG tablet Commonly known as: LEXAPRO Take 1 tablet (10 mg total) by mouth daily. Start taking on: February 27, 2023   ferrous sulfate 325 (65 FE) MG tablet Take 1 tablet (325 mg total) by mouth daily with breakfast. Home med   Gerhardt's butt cream Crea Apply 1 Application topically 2 (two) times daily.   guaiFENesin 600 MG 12 hr tablet Commonly known as: MUCINEX Take 2 tablets (1,200 mg total) by mouth 2 (two)  times daily.   ipratropium-albuterol 0.5-2.5 (3) MG/3ML Soln Commonly known as: DUONEB Take 3 mLs by nebulization every 6 (six) hours as needed.   lactulose 10 GM/15ML solution Commonly known as: CHRONULAC Take 45 mLs (30 g total) by mouth daily as needed for mild constipation.   melatonin 3 MG Tabs tablet Take 1 tablet (3 mg total) by mouth at bedtime.   mesalamine 1.2 g EC tablet Commonly known as: LIALDA Take 2 tablets (2.4 g total) by mouth 2 (two) times daily.   mouth rinse Liqd solution 15 mLs by Mouth Rinse route 4 (four) times daily.   pantoprazole 40 MG tablet Commonly known as: PROTONIX Take 1 tablet (40 mg total) by mouth at bedtime.   polyethylene glycol 17 g packet Commonly known as: MIRALAX / GLYCOLAX Take 17 g by mouth daily as needed for moderate constipation.   revefenacin 175 MCG/3ML  nebulizer solution Commonly known as: YUPELRI Take 3 mLs (175 mcg total) by nebulization daily.   senna-docusate 8.6-50 MG tablet Commonly known as: Senokot-S Take 2 tablets by mouth 2 (two) times daily as needed for mild constipation.   spironolactone 25 MG tablet Commonly known as: ALDACTONE Take 1 tablet (25 mg total) by mouth daily. Start taking on: February 27, 2023   Torsemide 40 MG Tabs Take 40 mg by mouth 2 (two) times daily.               Durable Medical Equipment  (From admission, onward)           Start     Ordered   02/24/23 1041  For home use only DME continuous positive airway pressure (CPAP)  Once       Question Answer Comment  Length of Need Lifetime   Patient has OSA or probable OSA Yes   Is the patient currently using CPAP in the home Yes   If yes (to question two) Determine DME provider and inform them of any new orders/settings   If no (to question two) date of sleep study nasal pillow size medium   Settings Autotitration   Signs and symptoms of probable OSA  (select all that apply) Snoring   CPAP supplies needed Mask, headgear,  cushions, filters, heated tubing and water chamber      02/24/23 1041   01/12/23 0925  For home use only DME Bedside commode  Once       Comments: Bariatric  Question:  Patient needs a bedside commode to treat with the following condition  Answer:  Obesity, morbid, BMI 50 or higher ([name])   01/12/23 0924              Discharge Care Instructions  (From admission, onward)           Start     Ordered   02/26/23 0000  Discharge wound care:       Comments: Standard pressure ulcer wound care   02/26/23 1019           No Known Allergies  Contact information for follow-up providers     Vida Rigger, MD Follow up.   Specialty: Pulmonary Disease Contact information: [address] [place] [address] [phone]         Bud Face, MD Follow up.   Specialty: Otolaryngology Contact information: [address] [place] [address] [phone]         Shayne Alken, MD Follow up.   Contact information: [address] [place] [address] [phone]              Contact information for after-discharge care     Destination     [facility] Preferred SNF .   Service: Skilled Nursing Contact information: [address] [place] [address] [phone]                      The results of significant diagnostics from this hospitalization (including imaging, microbiology, ancillary and laboratory) are listed below for reference.    Significant Diagnostic Studies: DG Chest Port 1 View  Result Date: 02/17/2023 CLINICAL DATA:  Atelectasis EXAM: PORTABLE CHEST 1 VIEW COMPARISON:  01/23/2023 FINDINGS: Tracheostomy tube remains in place. Stable cardiomegaly. Pulmonary vascular congestion. Mildly prominent perihilar and bibasilar interstitial markings. No pleural effusion or pneumothorax. IMPRESSION: Cardiomegaly with pulmonary vascular  congestion and mild interstitial edema.  Electronically Signed   By: Duanne Guess D.O.   On: 02/17/2023 14:48   CT SOFT TISSUE NECK WO CONTRAST  Result Date: 02/08/2023 CLINICAL DATA:  Laryngeal edema, tracheostomy malfunction, mucopurulent expectorant per tracheostomy EXAM: CT NECK WITHOUT CONTRAST TECHNIQUE: Multidetector CT imaging of the neck was performed following the standard protocol without intravenous contrast. RADIATION DOSE REDUCTION: This exam was performed according to the departmental dose-optimization program which includes automated exposure control, adjustment of the mA and/or kV according to patient size and/or use of iterative reconstruction technique. COMPARISON:  No prior CT neck available, correlation is made with 11/28/2022 CT cervical spine FINDINGS: Evaluation is somewhat limited by the absence of intravenous contrast. Pharynx and larynx: Tracheostomy tube in place. No definite collection around the 2, which appears patent. Otherwise normal noncontrast appearance of the pharynx and larynx. Thinning of the wall between esophagus and trachea, with possible defect (series 2, image 86, best appreciated on lung or bone windows), which appears new from the 11/28/2022 cervical spine CT, although this may be artifactual. Salivary glands: Fatty atrophy of the right submandibular gland. Otherwise normal a salivary glands. No inflammation, mass, or stone. Thyroid: Normal. Lymph nodes: None enlarged or abnormal density. Vascular: Calcifications in the intracranial vertebral and carotid arteries, at the carotid bifurcations, imaged aorta. Otherwise normal noncontrast appearance of the vasculature. Limited intracranial: Negative. Visualized orbits: Negative. Mastoids and visualized paranasal sinuses: Mucosal thickening in the left maxillary sinus. Otherwise clear imaged paranasal sinuses. Fluid in the left mastoid air cells. Skeleton: No acute osseous abnormality. Upper chest: Emphysema.  Dependent atelectasis in right greater than left lung. No pleural effusion. Other: None. IMPRESSION: 1. Tracheostomy tube in place. No definite collection around the tube, which appears patent, although evaluation is somewhat limited by the absence of intravenous contrast. 2. Thinning of the wall between esophagus and trachea, with possible defect, which appears new from the 11/28/2022 cervical spine CT but may be artifactual. Correlate with physical exam and direct visualization. 3. Otherwise normal noncontrast appearance of the pharynx and larynx. Aortic Atherosclerosis (ICD10-I70.0) and Emphysema (ICD10-J43.9). Electronically Signed   By: Wiliam Ke M.D.   On: 02/08/2023 18:43    Microbiology: No results found for this or any previous visit (from the past 240 hour(s)).   Labs: Basic Metabolic Panel: Recent Labs  Lab 02/25/23 0835 02/26/23 0850  NA 138 137  K 3.5 3.8  CL 98 99  CO2 30 29  GLUCOSE 161* 100*  BUN 36* 38*  CREATININE 1.52* 1.53*  CALCIUM 8.5* 8.6*   Liver Function Tests: No results for input(s): "AST", "ALT", "ALKPHOS", "BILITOT", "PROT", "ALBUMIN" in the last 168 hours. No results for input(s): "LIPASE", "AMYLASE" in the last 168 hours. No results for input(s): "AMMONIA" in the last 168 hours. CBC: Recent Labs  Lab 02/25/23 0835  WBC 7.7  HGB 11.2*  HCT 40.4  MCV 77.2*  PLT 183   Cardiac Enzymes: No results for input(s): "CKTOTAL", "CKMB", "CKMBINDEX", "TROPONINI" in the last 168 hours. BNP: BNP (last 3 results) Recent Labs    10/16/22 2330 11/03/22 1400 11/28/22 1448  BNP 26.8 29.6 27.1    ProBNP (last 3 results) No results for input(s): "PROBNP" in the last 8760 hours.  CBG: No results for input(s): "GLUCAP" in the last 168 hours.     Signed:  Silvano Bilis MD.  [facility] 02/26/2023, 10:26 AM
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
## 2023-02-27 DIAGNOSIS — I5033 Acute on chronic diastolic (congestive) heart failure: Secondary | ICD-10-CM | POA: Diagnosis not present

## 2023-02-27 LAB — BASIC METABOLIC PANEL
Anion gap: 9 (ref 5–15)
BUN: 39 mg/dL — ABNORMAL HIGH (ref 8–23)
CO2: 30 mmol/L (ref 22–32)
Calcium: 8.4 mg/dL — ABNORMAL LOW (ref 8.9–10.3)
Chloride: 97 mmol/L — ABNORMAL LOW (ref 98–111)
Creatinine, Ser: 1.67 mg/dL — ABNORMAL HIGH (ref 0.61–1.24)
GFR, Estimated: 45 mL/min — ABNORMAL LOW (ref >60)
Glucose, Bld: 181 mg/dL — ABNORMAL HIGH (ref 70–99)
Potassium: 3.4 mmol/L — ABNORMAL LOW (ref 3.5–5.1)
Sodium: 136 mmol/L (ref 135–145)

## 2023-02-27 NOTE — Plan of Care (Signed)
  Problem: Education: Goal: Knowledge of General Education information will improve Description: Including pain rating scale, medication(s)/side effects and non-pharmacologic comfort measures Outcome: Progressing   Problem: Activity: Goal: Risk for activity intolerance will decrease Outcome: Progressing   Problem: Nutrition: Goal: Adequate nutrition will be maintained Outcome: Progressing   Problem: Elimination: Goal: Will not experience complications related to bowel motility Outcome: Progressing   Problem: Pain Managment: Goal: General experience of comfort will improve Outcome: Progressing   Problem: Safety: Goal: Ability to remain free from injury will improve Outcome: Progressing   

## 2023-02-27 NOTE — Progress Notes (Signed)
Physical Therapy Treatment Patient Details Name: Gary [name] MRN: [id number] DOB: May 15, 1955 Today's Date: 02/27/2023   History of Present Illness 67 y/o male presented to [facility] ED on 11/28/22 for chest pain x 4 days. Admitted for acute on chronic CHF. Frequent admissions this year with most recent dsicharge on 11/21/22. PMH includes obesity, hypoxia, respiratory failure with tracheostomy, COPD, GIB, HFpEF.    PT Comments  Pt showed good effort with PT session, still wanting to direct most aspects of care per his preferences and unwilling to really challenge himself (eg. Trsf from lower bed, amb on 2L, try to push past initial UE fatigue, etc) but he genuinely has made slow and steady gains and does show motivation with exercise and awareness with needs and challenges.  Pt will benefit from further PT, continue with POC.    If plan is discharge home, recommend the following: A lot of help with bathing/dressing/bathroom;Assist for transportation;A lot of help with walking and/or transfers;A little help with walking and/or transfers   Can travel by private vehicle     No  Equipment Recommendations  Other (comment) (TBD)    Recommendations for Other Services       Precautions / Restrictions Precautions Precautions: Fall Restrictions Weight Bearing Restrictions: No RLE Weight Bearing: Weight bearing as tolerated     Mobility  Bed Mobility Overal bed mobility: Modified Independent Bed Mobility: Supine to Sit Rolling: Supervision   Supine to sit: Supervision     General bed mobility comments: Pt showed good control with getting LEs toward EOB and using UEs appropriately on rails    Transfers Overall transfer level: Needs assistance Equipment used: Rolling walker (2 wheels) Transfers: Sit to/from Stand Sit to Stand: From elevated surface, Contact guard assist           General transfer comment: Pt continues to self select significantly elevated bed height to rise to  standing despite encouragement to try and challenge himself from lower surface.  Pt did show improved awareness with UE use and pushing off from bed/rail to shift forward and up.    Ambulation/Gait Ambulation/Gait assistance: Contact guard assist Gait Distance (Feet): 140 Feet Assistive device: Rolling walker (2 wheels)         General Gait Details: Pt was able to maintain a surprisingly consistent cadence for ~100 ft, showed ability to safely maintain constant walker momentum and symmetical step length/cadence.  He started to c/o arm (and to a lesser degree thigh) fatigue at ~100 ft, no shortness of breath and good effort to try and breath through nose with some consistency.  SpO2 staying in the mid 90s on 4L (pt refused to try prolonged walk on 2L)   Stairs             Wheelchair Mobility     Tilt Bed    Modified Rankin (Stroke Patients Only)       Balance Overall balance assessment: Needs assistance Sitting-balance support: No upper extremity supported, Feet supported Sitting balance-[name] Scale: Normal     Standing balance support: Bilateral upper extremity supported Standing balance-[name] Scale: Fair Standing balance comment: Reliant on RW/AD for all standing activity.                            Cognition Arousal: Alert Behavior During Therapy: WFL for tasks assessed/performed Overall Cognitive Status: Within Functional Limits for tasks assessed  Exercises Other Exercises Other Exercises: Pt reports that he has been good about doing his HEP even stating that he is doing green T-band tricep extensions 100-200x/day in groups of 50 reps    General Comments General comments (skin integrity, edema, etc.): Per his usual, pt wanting to control most aspects of care and would not challenge himself to walk on 2 (vs self selected 4) L O2 and refused to stand from lower more challenging bed height...  all this being said he did show great effort with ambulation ~140 ft with consistent cadence and walker momentum most of the time      Pertinent Vitals/Pain Pain Assessment Pain Assessment: No/denies pain    Home Living                          Prior Function            PT Goals (current goals can now be found in the care plan section) Progress towards PT goals: Progressing toward goals    Frequency    Min 1X/week      PT Plan Current plan remains appropriate    Co-evaluation              AM-PAC PT "6 Clicks" Mobility   Outcome Measure  Help needed turning from your back to your side while in a flat bed without using bedrails?: None Help needed moving from lying on your back to sitting on the side of a flat bed without using bedrails?: None Help needed moving to and from a bed to a chair (including a wheelchair)?: A Lot Help needed standing up from a chair using your arms (e.g., wheelchair or bedside chair)?: A Little Help needed to walk in hospital room?: A Little Help needed climbing 3-5 steps with a railing? : Total 6 Click Score: 17    End of Session Equipment Utilized During Treatment: Oxygen Activity Tolerance: Patient tolerated treatment well Patient left: in chair;with call bell/phone within reach Nurse Communication: Mobility status PT Visit Diagnosis: Muscle weakness (generalized) (M62.81);Difficulty in walking, not elsewhere classified (R26.2)     Time: 1610-9604 PT Time Calculation (min) (ACUTE ONLY): 25 min  Charges:    $Gait Training: 8-22 mins $Therapeutic Activity: 8-22 mins PT General Charges $$ ACUTE PT VISIT: 1 Visit                     [name], DPT 02/27/2023, 3:23 PM

## 2023-02-27 NOTE — Progress Notes (Signed)
Brief Nutrition Follow-Up Note  Wt Readings from Last 15 Encounters:  11/12/22 (!) 197.2 kg  10/16/22 (!) 204.1 kg  10/03/22 (!) 218.4 kg  09/12/22 (!) 180.7 kg  08/21/22 (!) 182.4 kg  04/14/22 (!) 160.4 kg  04/06/22 (!) 193.2 kg  01/19/22 (!) 171.5 kg  01/03/22 (!) 171.5 kg  12/26/21 (!) 173.1 kg  12/12/21 (!) 183.7 kg  08/21/21 (!) 171.5 kg  08/15/21 (!) 181.8 kg  07/23/21 (!) 185.5 kg  07/05/21 (!) 180.1 kg   Reviewed I/O's: -2.2 L x 24 hours and -22.6 L since 02/13/23  UO: 2.5 L x 24 hours  Medications reviewed and include dulcolax, colace, lexapro, miralax, and aldactone.   Per TOC notes, pt continues to awaiting placement. Working on possible acceptance to [facility].   Body mass index is 48.57 kg/m. Patient meets criteria for obesity, class III based on current BMI. Obesity is a complex, chronic medical condition that is optimally managed by a multidisciplinary care team. Weight loss is not an ideal goal for an acute inpatient hospitalization. However, if further work-up for obesity is warranted, consider outpatient referral to [facility].  Pt has experienced a 7.2% wt loss over the past two months, which is favorable given his obesity. Suspect diuresis is also contributing to weight loss.   Current diet order is Heart Healthy (liberalized to 2 gram sodium), patient is consuming approximately 100% of meals at this time. Labs and medications reviewed.   No nutrition interventions warranted at this time. If nutrition issues arise, please consult RD.   Levada Schilling, RD, LDN, CDCES Registered Dietitian III Certified Diabetes Care and Education Specialist Please refer to [facility] for RD and/or RD on-call/weekend/after hours pager

## 2023-02-27 NOTE — Progress Notes (Signed)
Mobility Specialist - Progress Note  (On RA) Pre-mobility: HR, BP, SpO2 95% During mobility: HR 101, BP, SpO2 93%   02/27/23 1028  Mobility  Activity Ambulated with assistance in hallway  Level of Assistance Standby assist, set-up cues, supervision of patient - no hands on  Assistive Device Front wheel walker  Distance Ambulated (ft) 40 ft  Activity Response Tolerated well  $Mobility charge 1 Mobility  Mobility Specialist Start Time (ACUTE ONLY) [id number]  Mobility Specialist Stop Time (ACUTE ONLY) 0944  Mobility Specialist Time Calculation (min) (ACUTE ONLY) 20 min   Pt in fowler position upon entry, utilizing RA-- O2 95%, unsure of time spent disconnected from wall. Pt continued session on RA. Pt completed bed mob ModI, very reliant upon bed features. Pt STS to RW (bed elevation ~28') and amb 40 ft to the NS with supervision, O2 93% HR 101. Pt returned to bed, denied feeling SOB however felt fatigued after activity. Pt returned supine and placed on 2L [place], left in fowler position with needs within reach. RN present at bedside.  Zetta Bills Mobility Specialist 02/27/23 10:52 AM

## 2023-02-27 NOTE — Plan of Care (Signed)

## 2023-02-27 NOTE — TOC Progression Note (Signed)
Transition of Care [facility]) - Progression Note    Patient Details  Name: Gary [name] MRN: [id number] Date of Birth: [birth year]  Transition of Care [facility]) CM/SW Contact  Marlowe Sax, RN Phone Number: 02/27/2023, 11:18 AM  Clinical Narrative:    [facility] to see if they can accept the patient for STR, left a VM for Lorna Dibble the admissions coordinator, awaiting a call back   Expected Discharge Plan: Home w Home Health Services Barriers to Discharge: Continued Medical Work up  Expected Discharge Plan and Services     Post Acute Care Choice: Home Health   Expected Discharge Date: 02/26/23               DME Arranged: Bedside commode DME Agency: [employer] Date DME Agency Contacted: 01/12/23 Time DME Agency Contacted: 1427 Representative spoke with at DME Agency: [facility] HH Arranged: PT, OT HH Agency: [facility] Date [facility] Agency Contacted: 01/12/23 Time HH Agency Contacted: 1428 Representative spoke with at [facility] Agency: Kandee Keen   Social Determinants of Health (SDOH) Interventions SDOH Screenings   Food Insecurity: No Food Insecurity (12/01/2022)  Housing: Patient Declined (12/01/2022)  Transportation Needs: No Transportation Needs (12/01/2022)  Utilities: Not At Risk (12/01/2022)  Depression (PHQ2-9): Low Risk  (06/07/2021)  Financial Resource Strain: High Risk (11/21/2022)   Received from [facility]  Physical Activity: Insufficiently Active (05/03/2017)  Social Connections: Patient Declined (11/20/2022)   Received from [facility]  Stress: Stress Concern Present (11/20/2022)   Received from [facility]  Tobacco Use: Medium Risk (02/13/2023)    Readmission Risk Interventions    11/05/2022    2:59 PM 11/05/2022   12:16 PM 12/31/2021    8:58 AM  Readmission Risk Prevention Plan  Transportation Screening Complete Complete Complete  Medication Review Oceanographer) Complete Complete Complete  PCP or Specialist appointment within 3-5 days of  discharge Complete Complete Complete  HRI or Home Care Consult Complete Complete Complete  SW Recovery Care/Counseling Consult Complete Complete Complete  Palliative Care Screening Complete Complete Complete  Skilled Nursing Facility Not Applicable Not Applicable Not Applicable

## 2023-02-27 NOTE — Progress Notes (Signed)
Mobility Specialist - Progress Note   02/27/23 1518  Mobility  Activity Transferred from chair to bed  Level of Assistance +2 (takes two people)  Assistive Device  Water quality scientist)  Activity Response Tolerated well  $Mobility charge 1 Mobility  Mobility Specialist Start Time (ACUTE ONLY) 1436  Mobility Specialist Stop Time (ACUTE ONLY) 1455  Mobility Specialist Time Calculation (min) (ACUTE ONLY) 19 min   Pt transferred chair to bed via [place] lift +2 for safety. Pt left in fowler position with needs within reach.  Zetta Bills Mobility Specialist 02/27/23 3:20 PM

## 2023-02-27 NOTE — Progress Notes (Signed)
PROGRESS NOTE    Gary [name]  WGN:[id number] DOB: 04-23-1955 DOA: 11/28/2022 PCP: Shayne Alken, MD    Brief Narrative:   From admission h and p Gary [name] is a 67 y.o. 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   Gary [name] states that he ran out of his home torsemide several days ago due to a mixup with his pharmacy.  Then over the last couple days, he has noticed increasing lower extremity swelling, shortness of breath and chest pain.  He states that he usually develops chest pain when he has "fluid on his lungs."  He notes that his girlfriend feels that his face has even become more edematous.  During this time, he noticed some edema in his bilateral hands that was much worse on the left hand with associated numbness and tingling but this has improved.  He denies any nausea, vomiting, abdominal pain.   Gary [name] states that at 1 point, his supplemental oxygen level was as low as 6, but more recently he wears between 6-8L depending on how he feels.   Hospital Course:    Extended hospital course secondary to placement issues, resolved when we were able to wean him off NIV (which SNF will not take) and instead use cpap. He was declined by long-term care. He has had frequent admissions and remains at high risk for readmission.  11/13: Skilled nursing facility elected not to accept patient citing presence of tracheostomy has contraindication to admission.  TOC was informed of this only after patient had excepted and we had insurance authorization.  Assessment & Plan:   Principal Problem:   Acute on chronic diastolic CHF (congestive heart failure) ([name]) Active Problems:   Acute on chronic respiratory failure with hypoxia and hypercapnia ([name])   COPD (chronic obstructive  pulmonary disease) ([name])   Acute kidney injury superimposed on CKD ([name])   OSA (obstructive sleep apnea)   Anxiety   Hypotension   Chest pain   Morbid obesity with BMI of 50.0-59.9, adult ([name])   Generalized weakness   Chronic colitis   Fluid overload   Eyelid cyst, right   Dry skin   Tracheitis Acute on chronic diastolic CHF (congestive heart failure) ([name]) Continue torsemide , spironolactone.  Torsemide back to home dose.  Monitor kidney function periodically  Acute on chronic respiratory failure with hypoxia and hypercapnia ([name]) Trach Dependent Pseudomonas tracheitis On trach collar. S/p abx course for pseudomonas tracheitis Also evaluated for non-massive hemoptysis with CT soft tissue of neck done on 02/08/2023 .  Tracheostomy tube is in place and there is no definite collection around the tube.  There is thinning of the wall between the esophagus and trachea with possible defect. Stable on 2 L nasal cannula.  Pulmonology has signed off   COPD (chronic obstructive pulmonary disease) ([name]) Severe COPD on Combivent, as needed albuterol.  On yuperli nebulizer.   Acute kidney injury superimposed on CKD ([name]) Creat stable at ~1.5 - monitor periodically   Anxiety On Atarax as needed.  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   Chronic colitis Stable Continue home mesalamine   Generalized weakness Continue working with physical therapy and mobility specialist to work on getting out of the bed and ambulating.  Continue also doing exercises in the bed.   Morbid obesity with BMI of 50.0-59.9, adult ([name]) Complicates above care   Constipation Bm this morning - continue current regimen   DVT prophylaxis: Lovenox Code Status: Full Family Communication: None Disposition Plan: Status is: Inpatient Remains inpatient appropriate because: Unsafe discharge plan.  Patient medically appropriate    Level of  care: Med-Surg  Consultants:  None  Procedures:  None  Antimicrobials: None   Subjective: Seen and examined.  Resting in bed.  No visible distress.  No complaints of pain.  Objective: Vitals:   02/26/23 2340 02/27/23 0746 02/27/23 0827 02/27/23 1149  BP: 103/73  102/74   Pulse: 88 78 78   Resp: 20 18 16 16   Temp: 98.2 F (36.8 C)  97.7 F (36.5 C)   TempSrc:      SpO2: 95% 95% 97%   Height:        Intake/Output Summary (Last 24 hours) at 02/27/2023 1301 Last data filed at 02/27/2023 0630 Gross per 24 hour  Intake --  Output 2150 ml  Net -2150 ml   Filed Weights    Examination:  General exam: NAD.  Appears chronically ill Respiratory system: Scattered crackles bilaterally.  Normal work of breathing.  2 L.  Trach mask Cardiovascular system: S1-S2, RRR, no murmurs, trace pedal edema Gastrointestinal system: Obese, soft, NT/ND, normal bowel sounds Central nervous system: Alert and oriented. No focal neurological deficits. Extremities: Decreased power lower extremities.  Gait not assessed Skin: No rashes, lesions or ulcers Psychiatry: Judgement and insight appear normal. Mood & affect appropriate.     Data Reviewed: I have personally reviewed following labs and imaging studies  CBC: Recent Labs  Lab 02/25/23 0835  WBC 7.7  HGB 11.2*  HCT 40.4  MCV 77.2*  PLT 183   Basic Metabolic Panel: Recent Labs  Lab 02/25/23 0835 02/26/23 0850 02/27/23 1018  NA 138 137 136  K 3.5 3.8 3.4*  CL 98 99 97*  CO2 30 29 30   GLUCOSE 161* 100* 181*  BUN 36* 38* 39*  CREATININE 1.52* 1.53* 1.67*  CALCIUM 8.5* 8.6* 8.4*   GFR: Estimated Creatinine Clearance: 77.7 mL/min (A) (by C-G formula based on SCr of 1.67 mg/dL (H)). Liver Function Tests: No results for input(s): "AST", "ALT", "ALKPHOS", "BILITOT", "PROT", "ALBUMIN" in the last 168 hours. No results for input(s): "LIPASE", "AMYLASE" in the last 168 hours. No results for input(s): "AMMONIA" in the last 168  hours. Coagulation Profile: No results for input(s): "INR", "PROTIME" in the last 168 hours. Cardiac Enzymes: No results for input(s): "CKTOTAL", "CKMB", "CKMBINDEX", "TROPONINI" in the last 168 hours. BNP (last 3 results) No results for input(s): "PROBNP" in the last 8760 hours. HbA1C: No results for input(s): "HGBA1C" in the last 72 hours. CBG: No results for input(s): "GLUCAP" in the last 168 hours. Lipid Profile: No results for input(s): "CHOL", "HDL", "LDLCALC", "TRIG", "CHOLHDL", "LDLDIRECT" in the last 72 hours. Thyroid Function Tests: No results for input(s): "TSH", "T4TOTAL", "FREET4", "T3FREE", "THYROIDAB" in the last 72 hours. Anemia Panel: No results for input(s): "VITAMINB12", "FOLATE", "FERRITIN", "TIBC", "IRON", "RETICCTPCT" in the last 72 hours. Sepsis Labs: No results for input(s): "PROCALCITON", "LATICACIDVEN" in the last 168 hours.  No results found for this or any previous visit (from the past 240 hour(s)).       Radiology Studies: No results found.      Scheduled Meds:  amphetamine-dextroamphetamine  30 mg Oral Q breakfast   apixaban  2.5 mg Oral  BID   bacitracin   Topical Daily   bisacodyl  10 mg Oral Daily   docusate sodium  100 mg Oral Daily   escitalopram  10 mg Oral Daily   ferrous sulfate  325 mg Oral Q breakfast   Gerhardt's butt cream   Topical Daily   guaiFENesin  1,200 mg Oral BID   Ipratropium-Albuterol  1 puff Inhalation Q6H   polyethylene glycol  17 g Oral BID   spironolactone  25 mg Oral Daily   torsemide  40 mg Oral BID   Continuous Infusions:   LOS: 89 days    Tresa Moore, MD [facility]   If 7PM-7AM, please contact night-coverage  02/27/2023, 1:01 PM

## 2023-02-27 NOTE — Plan of Care (Signed)

## 2023-02-28 DIAGNOSIS — I5033 Acute on chronic diastolic (congestive) heart failure: Secondary | ICD-10-CM | POA: Diagnosis not present

## 2023-02-28 NOTE — Progress Notes (Signed)
Occupational Therapy Treatment Patient Details Name: Gary [name] MRN: [id number] DOB: May 07, 1955 Today's Date: 02/28/2023   History of present illness 67 y/o male presented to [facility] ED on 11/28/22 for chest pain x 4 days. Admitted for acute on chronic CHF. Frequent admissions this year with most recent dsicharge on 11/21/22. PMH includes obesity, hypoxia, respiratory failure with tracheostomy, COPD, GIB, HFpEF.   OT comments  Gary [name] was seen for OT treatment on this date. Upon arrival to room pt in bed, agreeable to tx. Pt required SBA + RW to exit bed from max elevated height and tolerated ~140 ft functional mobility. Educated on ECS and HEP. Pt making good progress toward goals, will continue to follow POC. Discharge recommendation remains appropriate.        If plan is discharge home, recommend the following:  A little help with walking and/or transfers;A lot of help with bathing/dressing/bathroom   Equipment Recommendations  BSC/3in1    Recommendations for Other Services      Precautions / Restrictions Precautions Precautions: Fall Restrictions Weight Bearing Restrictions: No RLE Weight Bearing: Weight bearing as tolerated       Mobility Bed Mobility Overal bed mobility: Modified Independent                  Transfers Overall transfer level: Needs assistance Equipment used: Rolling walker (2 wheels) Transfers: Sit to/from Stand Sit to Stand: From elevated surface, Supervision                 Balance Overall balance assessment: Needs assistance Sitting-balance support: No upper extremity supported, Feet supported Sitting balance-[name] Scale: Normal     Standing balance support: Bilateral upper extremity supported Standing balance-[name] Scale: Fair                             ADL either performed or assessed with clinical judgement   ADL Overall ADL's : Needs assistance/impaired                                        General ADL Comments: MAX A don B shoes in sitting      Cognition Arousal: Alert Behavior During Therapy: WFL for tasks assessed/performed Overall Cognitive Status: Within Functional Limits for tasks assessed                                                     Pertinent Vitals/ Pain       Pain Assessment Pain Assessment: No/denies pain   Frequency  Min 1X/week        Progress Toward Goals  OT Goals(current goals can now be found in the care plan section)  Progress towards OT goals: Progressing toward goals  Acute Rehab OT Goals Patient Stated Goal: to go home OT Goal Formulation: With patient Time For Goal Achievement: 03/30/23 Potential to Achieve Goals: Good ADL Goals Pt Will Perform Grooming: with modified independence;standing Pt Will Transfer to Toilet: with modified independence;ambulating;regular height toilet Pt Will Perform Toileting - Clothing Manipulation and hygiene: with mod assist;sit to/from stand  Plan Discharge plan remains appropriate;Frequency remains appropriate    Co-evaluation  AM-PAC OT "6 Clicks" Daily Activity     Outcome Measure   Help from another person eating meals?: None Help from another person taking care of personal grooming?: A Little Help from another person toileting, which includes using toliet, bedpan, or urinal?: A Lot Help from another person bathing (including washing, rinsing, drying)?: A Lot Help from another person to put on and taking off regular upper body clothing?: None Help from another person to put on and taking off regular lower body clothing?: A Lot 6 Click Score: 17    End of Session Equipment Utilized During Treatment: Oxygen;Rolling walker (2 wheels)  OT Visit Diagnosis: Unsteadiness on feet (R26.81);Other abnormalities of gait and mobility (R26.89);Muscle weakness (generalized) (M62.81);Adult, failure to thrive (R62.7)   Activity Tolerance Patient tolerated  treatment well   Patient Left in chair;with call bell/phone within reach   Nurse Communication          Time: 9528-4132 OT Time Calculation (min): 18 min  Charges: OT General Charges $OT Visit: 1 Visit OT Treatments $Therapeutic Activity: 8-22 mins  Kathie Dike, M.S. OTR/L  02/28/23, 1:39 PM  ascom [phone]

## 2023-02-28 NOTE — Progress Notes (Signed)
Mobility Specialist - Progress Note  During mobility: HR 103, SpO2 91% Post-mobility: HR 93, SPO2 94%   02/28/23 1108  Mobility  Activity Ambulated with assistance in hallway  Level of Assistance Standby assist, set-up cues, supervision of patient - no hands on  Assistive Device Front wheel walker  Distance Ambulated (ft) 80 ft  Activity Response Tolerated well  $Mobility charge 1 Mobility  Mobility Specialist Start Time (ACUTE ONLY) 1003  Mobility Specialist Stop Time (ACUTE ONLY) 1023  Mobility Specialist Time Calculation (min) (ACUTE ONLY) 20 min   Pt supine upon entry, utilizing 2L [place]. Pt completed bed mob ModI with max assistance from bed functions. Pt dangled EOB while MS placed Pt on 4L per Pt's request. Pt refused to attempt STS to lower bed elevation, requested bed elevation (~31'). Pt used gravitational force, downward slide to come to standing. Pt amb 80 ft in hallway with supervision-- ~20 ft into amb Pt weaned to 2L, O2 91%. Pt returned to the room, left in fowler position with needs within reach.   Zetta Bills Mobility Specialist 02/28/23 11:37 AM

## 2023-02-28 NOTE — Plan of Care (Signed)

## 2023-02-28 NOTE — Progress Notes (Signed)
Mobility Specialist - Progress Note   02/28/23 1523  Mobility  Activity Transferred from chair to bed  Level of Assistance +2 (takes two people)  Assistive Device  Water quality scientist lift)  Activity Response Tolerated well  $Mobility charge 1 Mobility  Mobility Specialist Start Time (ACUTE ONLY) 1448  Mobility Specialist Stop Time (ACUTE ONLY) 1503  Mobility Specialist Time Calculation (min) (ACUTE ONLY) 15 min   Pt sitting in the recliner upon entry, utilizing 2L [place]. Pt denied attempt to STS from recliner this date, expressing "I don't think I can do it" "My legs are weak and my back hurt". Pt transferred to bed via [facility] lift +2 for safety. Pt left in fowler position with needs within reach.  Zetta Bills Mobility Specialist 02/28/23 3:26 PM

## 2023-02-28 NOTE — Progress Notes (Signed)
PROGRESS NOTE    Gary [name]  ZOX:[id number] DOB: December 04, 1955 DOA: 11/28/2022 PCP: Gary Alken, MD    Brief Narrative:   From admission h and p Gary [name] is a 67 y.o. 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   Gary [name] states that he ran out of his home torsemide several days ago due to a mixup with his pharmacy.  Then over the last couple days, he has noticed increasing lower extremity swelling, shortness of breath and chest pain.  He states that he usually develops chest pain when he has "fluid on his lungs."  He notes that his girlfriend feels that his face has even become more edematous.  During this time, he noticed some edema in his bilateral hands that was much worse on the left hand with associated numbness and tingling but this has improved.  He denies any nausea, vomiting, abdominal pain.   Gary [name] states that at 1 point, his supplemental oxygen level was as low as 6, but more recently he wears between 6-8L depending on how he feels.   Hospital Course:    Extended hospital course secondary to placement issues, resolved when we were able to wean him off NIV (which SNF will not take) and instead use cpap. He was declined by long-term care. He has had frequent admissions and remains at high risk for readmission.  11/13: Skilled nursing facility elected not to accept Gary [name] citing presence of tracheostomy as contraindication to admission.  TOC was informed of this only after Gary [name] had been accepted and we had insurance authorization.  Assessment & Plan:   Principal Problem:   Acute on chronic diastolic CHF (congestive heart failure) ([name]) Active Problems:   Acute on chronic respiratory failure with hypoxia and hypercapnia ([name])   COPD (chronic  obstructive pulmonary disease) ([name])   Acute kidney injury superimposed on CKD ([name])   OSA (obstructive sleep apnea)   Anxiety   Hypotension   Chest pain   Morbid obesity with BMI of 50.0-59.9, adult ([name])   Generalized weakness   Chronic colitis   Fluid overload   Eyelid cyst, right   Dry skin   Tracheitis  Acute on chronic diastolic CHF (congestive heart failure) ([name]) Continue torsemide , spironolactone.  Torsemide back to home dose.  Stop checking daily labs.  Gary [name] appears euvolemic  Acute on chronic respiratory failure with hypoxia and hypercapnia ([name]) Trach Dependent Pseudomonas tracheitis On trach collar. S/p abx course for pseudomonas tracheitis Also evaluated for non-massive hemoptysis with CT soft tissue of neck done on 02/08/2023 .  Tracheostomy tube is in place and there is no definite collection around the tube.  There is thinning of the wall between the esophagus and trachea with possible defect. Stable on 2 L nasal cannula.  Pulmonology has signed off   COPD (chronic obstructive pulmonary disease) ([name]) Severe COPD on Combivent, as needed albuterol.  On yuperli nebulizer.   Acute kidney injury superimposed on CKD ([name]) Creat stable at ~1.5 - monitor periodically   Anxiety On Atarax as needed.  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   Chronic colitis Stable Continue home mesalamine   Generalized weakness Continue working with physical therapy and mobility specialist to work on getting  out of the bed and ambulating.  Continue also doing exercises in the bed.   Morbid obesity with BMI of 50.0-59.9, adult ([name]) Complicates above care   Constipation Bm this morning - continue current regimen   DVT prophylaxis: Lovenox Code Status: Full Family Communication: None Disposition Plan: Status is: Inpatient Remains inpatient appropriate because: Unsafe discharge plan.  Gary [name]  medically appropriate for discharge   Level of care: Med-Surg  Consultants:  None  Procedures:  None  Antimicrobials: None   Subjective: Seen and examined.  Appears motivated.  Has been working with mobility specialist and physical therapy.  Objective: Vitals:   02/27/23 1149 02/27/23 1527 02/27/23 2320 02/28/23 0747  BP:  109/72 104/74 104/70  Pulse:  88 86 79  Resp: 16 17 18 18   Temp:  97.8 F (36.6 C) 98.8 F (37.1 C) 97.8 F (36.6 C)  TempSrc:    Oral  SpO2:  94% 98% 94%  Height:        Intake/Output Summary (Last 24 hours) at 02/28/2023 1052 Last data filed at 02/28/2023 0900 Gross per 24 hour  Intake 1200 ml  Output 1550 ml  Net -350 ml   Filed Weights    Examination:  General exam: No acute distress Respiratory system: Bibasilar crackles.  Normal work of breathing.  2 L.  Trach mask Cardiovascular system: S1-S2, RRR, no murmurs, trace pedal edema Gastrointestinal system: Obese, soft, NT/ND, normal bowel sounds Central nervous system: Alert and oriented. No focal neurological deficits. Extremities: Decreased power lower extremities.  Gait not assessed Skin: No rashes, lesions or ulcers Psychiatry: Judgement and insight appear normal. Mood & affect appropriate.     Data Reviewed: I have personally reviewed following labs and imaging studies  CBC: Recent Labs  Lab 02/25/23 0835  WBC 7.7  HGB 11.2*  HCT 40.4  MCV 77.2*  PLT 183   Basic Metabolic Panel: Recent Labs  Lab 02/25/23 0835 02/26/23 0850 02/27/23 1018  NA 138 137 136  K 3.5 3.8 3.4*  CL 98 99 97*  CO2 30 29 30   GLUCOSE 161* 100* 181*  BUN 36* 38* 39*  CREATININE 1.52* 1.53* 1.67*  CALCIUM 8.5* 8.6* 8.4*   GFR: Estimated Creatinine Clearance: 77.7 mL/min (A) (by C-G formula based on SCr of 1.67 mg/dL (H)). Liver Function Tests: No results for input(s): "AST", "ALT", "ALKPHOS", "BILITOT", "PROT", "ALBUMIN" in the last 168 hours. No results for input(s): "LIPASE",  "AMYLASE" in the last 168 hours. No results for input(s): "AMMONIA" in the last 168 hours. Coagulation Profile: No results for input(s): "INR", "PROTIME" in the last 168 hours. Cardiac Enzymes: No results for input(s): "CKTOTAL", "CKMB", "CKMBINDEX", "TROPONINI" in the last 168 hours. BNP (last 3 results) No results for input(s): "PROBNP" in the last 8760 hours. HbA1C: No results for input(s): "HGBA1C" in the last 72 hours. CBG: No results for input(s): "GLUCAP" in the last 168 hours. Lipid Profile: No results for input(s): "CHOL", "HDL", "LDLCALC", "TRIG", "CHOLHDL", "LDLDIRECT" in the last 72 hours. Thyroid Function Tests: No results for input(s): "TSH", "T4TOTAL", "FREET4", "T3FREE", "THYROIDAB" in the last 72 hours. Anemia Panel: No results for input(s): "VITAMINB12", "FOLATE", "FERRITIN", "TIBC", "IRON", "RETICCTPCT" in the last 72 hours. Sepsis Labs: No results for input(s): "PROCALCITON", "LATICACIDVEN" in the last 168 hours.  No results found for this or any previous visit (from the past 240 hour(s)).       Radiology Studies: No results found.      Scheduled Meds:  amphetamine-dextroamphetamine  30 mg Oral  Q breakfast   apixaban  2.5 mg Oral BID   bacitracin   Topical Daily   bisacodyl  10 mg Oral Daily   docusate sodium  100 mg Oral Daily   escitalopram  10 mg Oral Daily   ferrous sulfate  325 mg Oral Q breakfast   Gerhardt's butt cream   Topical Daily   guaiFENesin  1,200 mg Oral BID   Ipratropium-Albuterol  1 puff Inhalation Q6H   polyethylene glycol  17 g Oral BID   spironolactone  25 mg Oral Daily   torsemide  40 mg Oral BID   Continuous Infusions:   LOS: 90 days    Tresa Moore, MD [facility]   If 7PM-7AM, please contact night-coverage  02/28/2023, 10:52 AM

## 2023-03-01 DIAGNOSIS — I5033 Acute on chronic diastolic (congestive) heart failure: Secondary | ICD-10-CM | POA: Diagnosis not present

## 2023-03-01 NOTE — Plan of Care (Signed)

## 2023-03-01 NOTE — Progress Notes (Addendum)
PT Cancellation Note  Patient Details Name: Gary [name] MRN: [id number] DOB: [birth year]   Cancelled Treatment:     PT attempt. Pt requested author return tomorrow. "I'm about to have a BM and don't want to get up right now. " Pt requested author return at noon next date.   [name] 03/01/2023, 3:20 PM

## 2023-03-01 NOTE — Progress Notes (Signed)
PROGRESS NOTE    Gary [name]  ZOX:[id number] DOB: Aug 04, 1955 DOA: 11/28/2022 PCP: Shayne Alken, MD    Brief Narrative:   From admission h and p Gary [name] is a 67 y.o. 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   Gary [name] states that he ran out of his home torsemide several days ago due to a mixup with his pharmacy.  Then over the last couple days, he has noticed increasing lower extremity swelling, shortness of breath and chest pain.  He states that he usually develops chest pain when he has "fluid on his lungs."  He notes that his girlfriend feels that his face has even become more edematous.  During this time, he noticed some edema in his bilateral hands that was much worse on the left hand with associated numbness and tingling but this has improved.  He denies any nausea, vomiting, abdominal pain.   Mr. [name] states that at 1 point, his supplemental oxygen level was as low as 6, but more recently he wears between 6-8L depending on how he feels.   Hospital Course:    Extended hospital course secondary to placement issues, resolved when we were able to wean him off NIV (which SNF will not take) and instead use cpap. He was declined by long-term care. He has had frequent admissions and remains at high risk for readmission.  11/13: Skilled nursing facility elected not to accept patient citing presence of tracheostomy as contraindication to admission.  TOC was informed of this only after patient had been accepted and we had insurance authorization.  Assessment & Plan:   Principal Problem:   Acute on chronic diastolic CHF (congestive heart failure) ([name]) Active Problems:   Acute on chronic respiratory failure with hypoxia and hypercapnia ([name])   COPD (chronic  obstructive pulmonary disease) ([name])   Acute kidney injury superimposed on CKD ([name])   OSA (obstructive sleep apnea)   Anxiety   Hypotension   Chest pain   Morbid obesity with BMI of 50.0-59.9, adult ([name])   Generalized weakness   Chronic colitis   Fluid overload   Eyelid cyst, right   Dry skin   Tracheitis  Acute on chronic diastolic CHF (congestive heart failure) ([name]) Continue torsemide , spironolactone.  Torsemide back to home dose.  Stop checking daily labs.  Patient appears euvolemic  Acute on chronic respiratory failure with hypoxia and hypercapnia ([name]) Trach Dependent Pseudomonas tracheitis On trach collar. S/p abx course for pseudomonas tracheitis Also evaluated for non-massive hemoptysis with CT soft tissue of neck done on 02/08/2023 .  Tracheostomy tube is in place and there is no definite collection around the tube.  There is thinning of the wall between the esophagus and trachea with possible defect. Stable on 2 L nasal cannula.  Pulmonology has signed off   COPD (chronic obstructive pulmonary disease) ([name]) Severe COPD on Combivent, as needed albuterol.  On yuperli nebulizer.   Acute kidney injury superimposed on CKD ([name]) Creat stable at ~1.5 - monitor periodically   Anxiety On Atarax as needed.  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   Chronic colitis Stable Continue home mesalamine   Generalized weakness Patient has been motivated and working with physical therapy and mobility specialist.  Continue ambulating and exercises much as tolerated.   Morbid obesity with BMI of 50.0-59.9, adult ([name]) Complicates above care   Constipation Bm this morning - continue current regimen   DVT prophylaxis: Lovenox Code Status: Full Family Communication: None Disposition Plan: Status is: Inpatient Remains inpatient appropriate because: Unsafe discharge plan.  Patient medically appropriate  for discharge   Level of care: Med-Surg  Consultants:  None  Procedures:  None  Antimicrobials: None   Subjective: Seen and examined.  Sitting up on edge of bed.  Appears motivated and working with mobility specialist.  Objective: Vitals:   02/28/23 1515 02/28/23 1538 02/28/23 2300 03/01/23 0755  BP:  107/72  111/89  Pulse:  91  81  Resp:    18  Temp:    97.9 F (36.6 C)  TempSrc:   Other (Comment)   SpO2:  93%  97%  Weight: (!) 185.4 kg     Height:        Intake/Output Summary (Last 24 hours) at 03/01/2023 1240 Last data filed at 03/01/2023 0700 Gross per 24 hour  Intake --  Output 900 ml  Net -900 ml   Filed Weights   02/28/23 1515  Weight: (!) 185.4 kg    Examination:  General exam: NAD Respiratory system: Bibasilar crackles.  Normal work of breathing.  2 L.  Trach mask Cardiovascular system: S1-S2, RRR, no murmurs, trace pedal edema Gastrointestinal system: Obese, soft, NT/ND, normal bowel sounds Central nervous system: Alert and oriented. No focal neurological deficits. Extremities: Decreased power lower extremities.  Gait not assessed Skin: No rashes, lesions or ulcers Psychiatry: Judgement and insight appear normal. Mood & affect appropriate.     Data Reviewed: I have personally reviewed following labs and imaging studies  CBC: Recent Labs  Lab 02/25/23 0835  WBC 7.7  HGB 11.2*  HCT 40.4  MCV 77.2*  PLT 183   Basic Metabolic Panel: Recent Labs  Lab 02/25/23 0835 02/26/23 0850 02/27/23 1018  NA 138 137 136  K 3.5 3.8 3.4*  CL 98 99 97*  CO2 30 29 30   GLUCOSE 161* 100* 181*  BUN 36* 38* 39*  CREATININE 1.52* 1.53* 1.67*  CALCIUM 8.5* 8.6* 8.4*   GFR: Estimated Creatinine Clearance: 78.7 mL/min (A) (by C-G formula based on SCr of 1.67 mg/dL (H)). Liver Function Tests: No results for input(s): "AST", "ALT", "ALKPHOS", "BILITOT", "PROT", "ALBUMIN" in the last 168 hours. No results for input(s): "LIPASE", "AMYLASE" in the last  168 hours. No results for input(s): "AMMONIA" in the last 168 hours. Coagulation Profile: No results for input(s): "INR", "PROTIME" in the last 168 hours. Cardiac Enzymes: No results for input(s): "CKTOTAL", "CKMB", "CKMBINDEX", "TROPONINI" in the last 168 hours. BNP (last 3 results) No results for input(s): "PROBNP" in the last 8760 hours. HbA1C: No results for input(s): "HGBA1C" in the last 72 hours. CBG: No results for input(s): "GLUCAP" in the last 168 hours. Lipid Profile: No results for input(s): "CHOL", "HDL", "LDLCALC", "TRIG", "CHOLHDL", "LDLDIRECT" in the last 72 hours. Thyroid Function Tests: No results for input(s): "TSH", "T4TOTAL", "FREET4", "T3FREE", "THYROIDAB" in the last 72 hours. Anemia Panel: No results for input(s): "VITAMINB12", "FOLATE", "FERRITIN", "TIBC", "IRON", "RETICCTPCT" in the last 72 hours. Sepsis Labs: No results for input(s): "PROCALCITON", "LATICACIDVEN" in the last 168 hours.  No results found for this or any previous visit (from the past 240 hour(s)).       Radiology Studies: No results found.      Scheduled Meds:  amphetamine-dextroamphetamine  30 mg Oral Q breakfast   apixaban  2.5 mg Oral BID   bacitracin   Topical Daily   bisacodyl  10 mg Oral Daily   docusate sodium  100 mg Oral Daily   escitalopram  10 mg Oral Daily   ferrous sulfate  325 mg Oral Q breakfast   Gerhardt's butt cream   Topical Daily   guaiFENesin  1,200 mg Oral BID   Ipratropium-Albuterol  1 puff Inhalation Q6H   polyethylene glycol  17 g Oral BID   spironolactone  25 mg Oral Daily   torsemide  40 mg Oral BID   Continuous Infusions:   LOS: 91 days    Tresa Moore, MD [facility]   If 7PM-7AM, please contact night-coverage  03/01/2023, 12:40 PM

## 2023-03-01 NOTE — Plan of Care (Signed)

## 2023-03-01 NOTE — Progress Notes (Signed)
Mobility Specialist - Progress Note   03/01/23 1021  Mobility  Activity Stood at bedside;Transferred to/from [facility]  Level of Assistance Moderate assist, patient does 50-74%  Assistive Device Front wheel walker  Distance Ambulated (ft) 8 ft  Activity Response Tolerated well  $Mobility charge 1 Mobility  Mobility Specialist Start Time (ACUTE ONLY) [id number]  Mobility Specialist Stop Time (ACUTE ONLY) 0957  Mobility Specialist Time Calculation (min) (ACUTE ONLY) 24 min   Pt supine upon entry, utilizing 2L [place]. Pt motivated and agreeable to practice STS from lower elevation. Pt denied using the recliner instead opting for the [facility], max encouragement through session due to Pt's "lack of confidence". Pt completed bed mob ModI, STS to RW and transferred to the [facility] supervision. Once seated on the BSC Pt able to scoot towards the edge of seat indep. Pt STS to RW from [facility] Mod-MinA +2, extra time required to bring BLE closer to person upon standing-- use of BUE to push up from [facility]. Pt expressed feeling SOB upon return to bed, also expressed that the transfer was "easier than I thought it would be", denies fatigue. Pt left in fowler position with needs within reach.  Zetta Bills Mobility Specialist 03/01/23 10:38 AM

## 2023-03-02 DIAGNOSIS — I5033 Acute on chronic diastolic (congestive) heart failure: Secondary | ICD-10-CM | POA: Diagnosis not present

## 2023-03-02 NOTE — Progress Notes (Signed)
PROGRESS NOTE    Gary [name]  ZOX:[id number] DOB: 04/19/1955 DOA: 11/28/2022 PCP: Shayne Alken, MD    Brief Narrative:   From admission h and p Gary [name] is a 67 y.o. 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   Gary [name] states that he ran out of his home torsemide several days ago due to a mixup with his pharmacy.  Then over the last couple days, he has noticed increasing lower extremity swelling, shortness of breath and chest pain.  He states that he usually develops chest pain when he has "fluid on his lungs."  He notes that his girlfriend feels that his face has even become more edematous.  During this time, he noticed some edema in his bilateral hands that was much worse on the left hand with associated numbness and tingling but this has improved.  He denies any nausea, vomiting, abdominal pain.   Gary [name] states that at 1 point, his supplemental oxygen level was as low as 6, but more recently he wears between 6-8L depending on how he feels.   Hospital Course:    Extended hospital course secondary to placement issues, resolved when we were able to wean him off NIV (which SNF will not take) and instead use cpap. He was declined by long-term care. He has had frequent admissions and remains at high risk for readmission.  11/13: Skilled nursing facility elected not to accept patient citing presence of tracheostomy as contraindication to admission.  TOC was informed of this only after patient had been accepted and we had insurance authorization.  Assessment & Plan:   Principal Problem:   Acute on chronic diastolic CHF (congestive heart failure) ([name]) Active Problems:   Acute on chronic respiratory failure with hypoxia and hypercapnia ([name])   COPD (chronic  obstructive pulmonary disease) ([name])   Acute kidney injury superimposed on CKD ([name])   OSA (obstructive sleep apnea)   Anxiety   Hypotension   Chest pain   Morbid obesity with BMI of 50.0-59.9, adult ([name])   Generalized weakness   Chronic colitis   Fluid overload   Eyelid cyst, right   Dry skin   Tracheitis  Acute on chronic diastolic CHF (congestive heart failure) ([name]) Continue torsemide , spironolactone.  Torsemide back to home dose.  Stop checking daily labs.  Patient appears euvolemic.  Monitor volume status closely  Acute on chronic respiratory failure with hypoxia and hypercapnia ([name]) Trach Dependent Pseudomonas tracheitis On trach collar. S/p abx course for pseudomonas tracheitis Also evaluated for non-massive hemoptysis with CT soft tissue of neck done on 02/08/2023 .  Tracheostomy tube is in place and there is no definite collection around the tube.  There is thinning of the wall between the esophagus and trachea with possible defect. Stable on 2 L nasal cannula.  Pulmonology has signed off   COPD (chronic obstructive pulmonary disease) ([name]) Severe COPD on Combivent, as needed albuterol.  On yuperli nebulizer.   Acute kidney injury superimposed on CKD ([name]) Creat stable at ~1.5 - monitor periodically   Anxiety On Atarax as needed.  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   Chronic colitis Stable Continue home mesalamine   Generalized weakness Patient has been motivated and working with  physical therapy and mobility specialist.  Continue ambulating and exercises much as tolerated. Patient is appropriate for discharge to inpatient rehab versus skilled nursing facility   Morbid obesity with BMI of 50.0-59.9, adult ([name]) Complicates above care   Constipation Bm this morning - continue current regimen   DVT prophylaxis: Lovenox Code Status: Full Family Communication: None Disposition  Plan: Status is: Inpatient Remains inpatient appropriate because: Unsafe discharge plan.  Patient medically appropriate for discharge   Level of care: Med-Surg  Consultants:  None  Procedures:  None  Antimicrobials: None   Subjective: Seen and examined.  Lying in bed.  No visible distress.  No complaints of pain.  Objective: Vitals:   03/01/23 1704 03/02/23 0817 03/02/23 0828 03/02/23 1124  BP: 96/64 108/76    Pulse: 82 80  80  Resp: 15 19 18 18   Temp: 98.1 F (36.7 C) 97.7 F (36.5 C)    TempSrc:      SpO2: 95% 93%  94%  Weight:      Height:        Intake/Output Summary (Last 24 hours) at 03/02/2023 1221 Last data filed at 03/02/2023 0845 Gross per 24 hour  Intake 120 ml  Output 2100 ml  Net -1980 ml   Filed Weights   02/28/23 1515  Weight: (!) 185.4 kg    Examination:  General exam: No acute distress Respiratory system: Bibasilar crackles.  Normal work of breathing.  2 L.  Trach mask Cardiovascular system: S1-S2, RRR, no murmurs, trace pedal edema Gastrointestinal system: Obese, soft, NT/ND, normal bowel sounds Central nervous system: Alert and oriented. No focal neurological deficits. Extremities: Decreased power lower extremities.  Gait not assessed Skin: No rashes, lesions or ulcers Psychiatry: Judgement and insight appear normal. Mood & affect appropriate.     Data Reviewed: I have personally reviewed following labs and imaging studies  CBC: Recent Labs  Lab 02/25/23 0835  WBC 7.7  HGB 11.2*  HCT 40.4  MCV 77.2*  PLT 183   Basic Metabolic Panel: Recent Labs  Lab 02/25/23 0835 02/26/23 0850 02/27/23 1018  NA 138 137 136  K 3.5 3.8 3.4*  CL 98 99 97*  CO2 30 29 30   GLUCOSE 161* 100* 181*  BUN 36* 38* 39*  CREATININE 1.52* 1.53* 1.67*  CALCIUM 8.5* 8.6* 8.4*   GFR: Estimated Creatinine Clearance: 78.7 mL/min (A) (by C-G formula based on SCr of 1.67 mg/dL (H)). Liver Function Tests: No results for input(s): "AST", "ALT",  "ALKPHOS", "BILITOT", "PROT", "ALBUMIN" in the last 168 hours. No results for input(s): "LIPASE", "AMYLASE" in the last 168 hours. No results for input(s): "AMMONIA" in the last 168 hours. Coagulation Profile: No results for input(s): "INR", "PROTIME" in the last 168 hours. Cardiac Enzymes: No results for input(s): "CKTOTAL", "CKMB", "CKMBINDEX", "TROPONINI" in the last 168 hours. BNP (last 3 results) No results for input(s): "PROBNP" in the last 8760 hours. HbA1C: No results for input(s): "HGBA1C" in the last 72 hours. CBG: No results for input(s): "GLUCAP" in the last 168 hours. Lipid Profile: No results for input(s): "CHOL", "HDL", "LDLCALC", "TRIG", "CHOLHDL", "LDLDIRECT" in the last 72 hours. Thyroid Function Tests: No results for input(s): "TSH", "T4TOTAL", "FREET4", "T3FREE", "THYROIDAB" in the last 72 hours. Anemia Panel: No results for input(s): "VITAMINB12", "FOLATE", "FERRITIN", "TIBC", "IRON", "RETICCTPCT" in the last 72 hours. Sepsis Labs: No results for input(s): "PROCALCITON", "LATICACIDVEN" in the last 168 hours.  No results found for this or any previous visit (from the past 240 hour(s)).  Radiology Studies: No results found.      Scheduled Meds:  amphetamine-dextroamphetamine  30 mg Oral Q breakfast   apixaban  2.5 mg Oral BID   bacitracin   Topical Daily   bisacodyl  10 mg Oral Daily   docusate sodium  100 mg Oral Daily   escitalopram  10 mg Oral Daily   ferrous sulfate  325 mg Oral Q breakfast   Gerhardt's butt cream   Topical Daily   guaiFENesin  1,200 mg Oral BID   Ipratropium-Albuterol  1 puff Inhalation Q6H   polyethylene glycol  17 g Oral BID   spironolactone  25 mg Oral Daily   torsemide  40 mg Oral BID   Continuous Infusions:   LOS: 92 days    Tresa Moore, MD [facility]   If 7PM-7AM, please contact night-coverage  03/02/2023, 12:21 PM

## 2023-03-02 NOTE — Plan of Care (Signed)

## 2023-03-02 NOTE — Plan of Care (Signed)
  Problem: Clinical Measurements: Goal: Respiratory complications will improve Outcome: Progressing   Problem: Nutrition: Goal: Adequate nutrition will be maintained Outcome: Progressing   Problem: Coping: Goal: Level of anxiety will decrease Outcome: Progressing   Problem: Elimination: Goal: Will not experience complications related to urinary retention Outcome: Progressing   Problem: Pain Managment: Goal: General experience of comfort will improve Outcome: Progressing   Problem: Skin Integrity: Goal: Risk for impaired skin integrity will decrease Outcome: Progressing

## 2023-03-03 DIAGNOSIS — J9601 Acute respiratory failure with hypoxia: Secondary | ICD-10-CM | POA: Diagnosis not present

## 2023-03-03 DIAGNOSIS — J441 Chronic obstructive pulmonary disease with (acute) exacerbation: Secondary | ICD-10-CM | POA: Diagnosis not present

## 2023-03-03 DIAGNOSIS — I5033 Acute on chronic diastolic (congestive) heart failure: Secondary | ICD-10-CM | POA: Diagnosis not present

## 2023-03-03 DIAGNOSIS — J9602 Acute respiratory failure with hypercapnia: Secondary | ICD-10-CM | POA: Diagnosis not present

## 2023-03-03 DIAGNOSIS — E8770 Fluid overload, unspecified: Secondary | ICD-10-CM | POA: Diagnosis not present

## 2023-03-03 NOTE — Progress Notes (Signed)
PROGRESS NOTE    Gary [name]  ZOX:[id number] DOB: 07-May-1955 DOA: 11/28/2022 PCP: Shayne Alken, MD    Brief Narrative:   From admission h and p Gary [name] is a 67 y.o. 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   Gary [name] states that he ran out of his home torsemide several days ago due to a mixup with his pharmacy.  Then over the last couple days, he has noticed increasing lower extremity swelling, shortness of breath and chest pain.  He states that he usually develops chest pain when he has "fluid on his lungs."  He notes that his girlfriend feels that his face has even become more edematous.  During this time, he noticed some edema in his bilateral hands that was much worse on the left hand with associated numbness and tingling but this has improved.  He denies any nausea, vomiting, abdominal pain.   Gary [name] states that at 1 point, his supplemental oxygen level was as low as 6, but more recently he wears between 6-8L depending on how he feels.   Hospital Course:    Extended hospital course secondary to placement issues, resolved when we were able to wean him off NIV (which SNF will not take) and instead use cpap. He was declined by long-term care. He has had frequent admissions and remains at high risk for readmission.  11/13: Skilled nursing facility elected not to accept patient citing presence of tracheostomy as contraindication to admission.  TOC was informed of this only after patient had been accepted and we had insurance authorization.  Assessment & Plan:   Principal Problem:   Acute on chronic diastolic CHF (congestive heart failure) ([name]) Active Problems:   Acute on chronic respiratory failure with hypoxia and hypercapnia ([name])   COPD (chronic  obstructive pulmonary disease) ([name])   Acute kidney injury superimposed on CKD ([name])   OSA (obstructive sleep apnea)   Anxiety   Hypotension   Chest pain   Morbid obesity with BMI of 50.0-59.9, adult ([name])   Generalized weakness   Chronic colitis   Fluid overload   Eyelid cyst, right   Dry skin   Tracheitis  Acute on chronic diastolic CHF (congestive heart failure) ([name]) Continue torsemide , spironolactone.  Torsemide back to home dose.  Stop checking daily labs.  Patient appears euvolemic.  Monitor volume status closely  Acute on chronic respiratory failure with hypoxia and hypercapnia ([name]) Trach Dependent Pseudomonas tracheitis On trach collar. S/p abx course for pseudomonas tracheitis Also evaluated for non-massive hemoptysis with CT soft tissue of neck done on 02/08/2023 .  Tracheostomy tube is in place and there is no definite collection around the tube.  There is thinning of the wall between the esophagus and trachea with possible defect. Stable on 2 L nasal cannula.  Pulmonology has signed off   COPD (chronic obstructive pulmonary disease) ([name]) Severe COPD on Combivent, as needed albuterol.  On yuperli nebulizer.   Acute kidney injury superimposed on CKD ([name]) Creat stable at ~1.5 - monitor periodically   Anxiety On Atarax as needed.  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   Chronic colitis Stable Continue home mesalamine   Generalized weakness Patient has been motivated and working with  physical therapy and mobility specialist.  Continue ambulating and exercises much as tolerated. Patient is appropriate for discharge to inpatient rehab versus skilled nursing facility   Morbid obesity with BMI of 50.0-59.9, adult ([name]) Complicates above care   Constipation Reported on 11/17 Advised nurse to administer as needed lactulose Monitor for BM   DVT prophylaxis: Lovenox Code Status:  Full Family Communication: None Disposition Plan: Status is: Inpatient Remains inpatient appropriate because: Unsafe discharge plan.  Patient medically appropriate for discharge   Level of care: Med-Surg  Consultants:  None  Procedures:  None  Antimicrobials: None   Subjective: Seen and examined.  Reports constipation.  No other complaints.  Objective: Vitals:   03/02/23 2314 03/03/23 0703 03/03/23 0748 03/03/23 0855  BP:    106/75  Pulse: 83  79 75  Resp: 20  20 18   Temp:    97.7 F (36.5 C)  TempSrc:      SpO2: 97%  92% 91%  Weight:  (!) 185.4 kg    Height:        Intake/Output Summary (Last 24 hours) at 03/03/2023 0957 Last data filed at 03/03/2023 0855 Gross per 24 hour  Intake --  Output 2800 ml  Net -2800 ml   Filed Weights   02/28/23 1515 03/03/23 0703  Weight: (!) 185.4 kg (!) 185.4 kg    Examination:  General exam: NAD Respiratory system: Diminished at bases.  Normal work of breathing.  2 L trach mask Cardiovascular system: S1-S2, RRR, no murmurs, trace pedal edema Gastrointestinal system: Obese, soft, NT/ND, normal bowel sounds Central nervous system: Alert and oriented. No focal neurological deficits. Extremities: Decreased power lower extremities.  Gait not assessed Skin: No rashes, lesions or ulcers Psychiatry: Judgement and insight appear normal. Mood & affect appropriate.     Data Reviewed: I have personally reviewed following labs and imaging studies  CBC: Recent Labs  Lab 02/25/23 0835  WBC 7.7  HGB 11.2*  HCT 40.4  MCV 77.2*  PLT 183   Basic Metabolic Panel: Recent Labs  Lab 02/25/23 0835 02/26/23 0850 02/27/23 1018  NA 138 137 136  K 3.5 3.8 3.4*  CL 98 99 97*  CO2 30 29 30   GLUCOSE 161* 100* 181*  BUN 36* 38* 39*  CREATININE 1.52* 1.53* 1.67*  CALCIUM 8.5* 8.6* 8.4*   GFR: Estimated Creatinine Clearance: 78.7 mL/min (A) (by C-G formula based on SCr of 1.67 mg/dL (H)). Liver Function Tests: No results for  input(s): "AST", "ALT", "ALKPHOS", "BILITOT", "PROT", "ALBUMIN" in the last 168 hours. No results for input(s): "LIPASE", "AMYLASE" in the last 168 hours. No results for input(s): "AMMONIA" in the last 168 hours. Coagulation Profile: No results for input(s): "INR", "PROTIME" in the last 168 hours. Cardiac Enzymes: No results for input(s): "CKTOTAL", "CKMB", "CKMBINDEX", "TROPONINI" in the last 168 hours. BNP (last 3 results) No results for input(s): "PROBNP" in the last 8760 hours. HbA1C: No results for input(s): "HGBA1C" in the last 72 hours. CBG: No results for input(s): "GLUCAP" in the last 168 hours. Lipid Profile: No results for input(s): "CHOL", "HDL", "LDLCALC", "TRIG", "CHOLHDL", "LDLDIRECT" in the last 72 hours. Thyroid Function Tests: No results for input(s): "TSH", "T4TOTAL", "FREET4", "T3FREE", "THYROIDAB" in the last 72 hours. Anemia Panel: No results for input(s): "VITAMINB12", "FOLATE", "FERRITIN", "TIBC", "IRON", "RETICCTPCT" in the last 72 hours. Sepsis Labs: No results for input(s): "PROCALCITON", "LATICACIDVEN" in the last 168 hours.  No results found for this or any previous visit (from the past 240 hour(s)).  Radiology Studies: No results found.      Scheduled Meds:  amphetamine-dextroamphetamine  30 mg Oral Q breakfast   apixaban  2.5 mg Oral BID   bacitracin   Topical Daily   bisacodyl  10 mg Oral Daily   docusate sodium  100 mg Oral Daily   escitalopram  10 mg Oral Daily   ferrous sulfate  325 mg Oral Q breakfast   Gerhardt's butt cream   Topical Daily   guaiFENesin  1,200 mg Oral BID   Ipratropium-Albuterol  1 puff Inhalation Q6H   polyethylene glycol  17 g Oral BID   spironolactone  25 mg Oral Daily   torsemide  40 mg Oral BID   Continuous Infusions:   LOS: 93 days    Tresa Moore, MD [facility]   If 7PM-7AM, please contact night-coverage  03/03/2023, 9:57 AM

## 2023-03-03 NOTE — Plan of Care (Signed)
  Problem: Education: Goal: Knowledge of General Education information will improve Description Including pain rating scale, medication(s)/side effects and non-pharmacologic comfort measures Outcome: Progressing   
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
## 2023-03-04 DIAGNOSIS — E8770 Fluid overload, unspecified: Secondary | ICD-10-CM | POA: Diagnosis not present

## 2023-03-04 DIAGNOSIS — J9602 Acute respiratory failure with hypercapnia: Secondary | ICD-10-CM | POA: Diagnosis not present

## 2023-03-04 DIAGNOSIS — J441 Chronic obstructive pulmonary disease with (acute) exacerbation: Secondary | ICD-10-CM | POA: Diagnosis not present

## 2023-03-04 DIAGNOSIS — J9601 Acute respiratory failure with hypoxia: Secondary | ICD-10-CM | POA: Diagnosis not present

## 2023-03-04 DIAGNOSIS — I5033 Acute on chronic diastolic (congestive) heart failure: Secondary | ICD-10-CM | POA: Diagnosis not present

## 2023-03-04 NOTE — Progress Notes (Signed)
PROGRESS NOTE    Gary [name]  VOZ:[id number] DOB: 06-25-1955 DOA: 11/28/2022 PCP: Shayne Alken, MD    Brief Narrative:   From admission h and p Gary [name] is a 67 y.o. 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   Gary [name] states that he ran out of his home torsemide several days ago due to a mixup with his pharmacy.  Then over the last couple days, he has noticed increasing lower extremity swelling, shortness of breath and chest pain.  He states that he usually develops chest pain when he has "fluid on his lungs."  He notes that his girlfriend feels that his face has even become more edematous.  During this time, he noticed some edema in his bilateral hands that was much worse on the left hand with associated numbness and tingling but this has improved.  He denies any nausea, vomiting, abdominal pain.   Gary [name] states that at 1 point, his supplemental oxygen level was as low as 6, but more recently he wears between 6-8L depending on how he feels.   Hospital Course:    Extended hospital course secondary to placement issues, resolved when we were able to wean him off NIV (which SNF will not take) and instead use cpap. He was declined by long-term care. He has had frequent admissions and remains at high risk for readmission.  11/13: Skilled nursing facility elected not to accept patient citing presence of tracheostomy as contraindication to admission.  TOC was informed of this only after patient had been accepted and we had insurance authorization.  Assessment & Plan:   Principal Problem:   Acute on chronic diastolic CHF (congestive heart failure) ([name]) Active Problems:   Acute on chronic respiratory failure with hypoxia and hypercapnia ([name])   COPD (chronic  obstructive pulmonary disease) ([name])   Acute kidney injury superimposed on CKD ([name])   OSA (obstructive sleep apnea)   Anxiety   Hypotension   Chest pain   Morbid obesity with BMI of 50.0-59.9, adult ([name])   Generalized weakness   Chronic colitis   Fluid overload   Eyelid cyst, right   Dry skin   Tracheitis  Acute on chronic diastolic CHF (congestive heart failure) ([name]) Continue torsemide , spironolactone.  Torsemide back to home dose.  Stop checking daily labs.  Patient appears euvolemic.  Close monitoring of volume status  Acute on chronic respiratory failure with hypoxia and hypercapnia ([name]) Trach Dependent Pseudomonas tracheitis On trach collar. S/p abx course for pseudomonas tracheitis Also evaluated for non-massive hemoptysis with CT soft tissue of neck done on 02/08/2023 .  Tracheostomy tube is in place and there is no definite collection around the tube.  There is thinning of the wall between the esophagus and trachea with possible defect. Stable on 2 L nasal cannula.    COPD (chronic obstructive pulmonary disease) ([name]) Severe COPD on Combivent, as needed albuterol.  On yuperli nebulizer.   Acute kidney injury superimposed on CKD ([name]) Creat stable at ~1.5 - monitor periodically   Anxiety On Atarax as needed.  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   Chronic colitis Stable Continue home mesalamine   Generalized weakness Patient has been motivated and working with physical therapy and  mobility specialist.  Continue ambulating and exercises much as tolerated. Patient is appropriate for discharge to inpatient rehab versus skilled nursing facility   Morbid obesity with BMI of 50.0-59.9, adult ([name]) Complicates above care   Constipation Reported on 11/17 Advised nurse to administer as needed lactulose Monitor for BM   DVT prophylaxis: Lovenox Code Status: Full Family Communication:  None Disposition Plan: Status is: Inpatient Remains inpatient appropriate because: Unsafe discharge plan.  Patient medically appropriate for discharge   Level of care: Med-Surg  Consultants:  None  Procedures:  None  Antimicrobials: None   Subjective: Seen and examined.  No acute overnight events  Objective: Vitals:   03/03/23 0855 03/03/23 1732 03/04/23 0746 03/04/23 0809  BP: 106/75 104/75  107/76  Pulse: 75 85 76 79  Resp: 18 18 18 18   Temp: 97.7 F (36.5 C) 98 F (36.7 C)  98.1 F (36.7 C)  TempSrc:    Oral  SpO2: 91% 96% 96% 95%  Weight:      Height:        Intake/Output Summary (Last 24 hours) at 03/04/2023 1112 Last data filed at 03/04/2023 2952 Gross per 24 hour  Intake 360 ml  Output 400 ml  Net -40 ml   Filed Weights   02/28/23 1515 03/03/23 0703  Weight: (!) 185.4 kg (!) 185.4 kg    Examination:  General exam: No acute distress Respiratory system: Diminished at bases.  Normal work of breathing.  2 L trach mask Cardiovascular system: S1-S2, RRR, no murmurs, trace pedal edema Gastrointestinal system: Obese, soft, NT/ND, normal bowel sounds Central nervous system: Alert and oriented. No focal neurological deficits. Extremities: Decreased power lower extremities.  Gait not assessed Skin: No rashes, lesions or ulcers Psychiatry: Judgement and insight appear normal. Mood & affect appropriate.     Data Reviewed: I have personally reviewed following labs and imaging studies  CBC: No results for input(s): "WBC", "NEUTROABS", "HGB", "HCT", "MCV", "PLT" in the last 168 hours.  Basic Metabolic Panel: Recent Labs  Lab 02/26/23 0850 02/27/23 1018  NA 137 136  K 3.8 3.4*  CL 99 97*  CO2 29 30  GLUCOSE 100* 181*  BUN 38* 39*  CREATININE 1.53* 1.67*  CALCIUM 8.6* 8.4*   GFR: Estimated Creatinine Clearance: 78.7 mL/min (A) (by C-G formula based on SCr of 1.67 mg/dL (H)). Liver Function Tests: No results for input(s): "AST", "ALT", "ALKPHOS",  "BILITOT", "PROT", "ALBUMIN" in the last 168 hours. No results for input(s): "LIPASE", "AMYLASE" in the last 168 hours. No results for input(s): "AMMONIA" in the last 168 hours. Coagulation Profile: No results for input(s): "INR", "PROTIME" in the last 168 hours. Cardiac Enzymes: No results for input(s): "CKTOTAL", "CKMB", "CKMBINDEX", "TROPONINI" in the last 168 hours. BNP (last 3 results) No results for input(s): "PROBNP" in the last 8760 hours. HbA1C: No results for input(s): "HGBA1C" in the last 72 hours. CBG: No results for input(s): "GLUCAP" in the last 168 hours. Lipid Profile: No results for input(s): "CHOL", "HDL", "LDLCALC", "TRIG", "CHOLHDL", "LDLDIRECT" in the last 72 hours. Thyroid Function Tests: No results for input(s): "TSH", "T4TOTAL", "FREET4", "T3FREE", "THYROIDAB" in the last 72 hours. Anemia Panel: No results for input(s): "VITAMINB12", "FOLATE", "FERRITIN", "TIBC", "IRON", "RETICCTPCT" in the last 72 hours. Sepsis Labs: No results for input(s): "PROCALCITON", "LATICACIDVEN" in the last 168 hours.  No results found for this or any previous visit (from the past 240 hour(s)).       Radiology Studies: No results found.  Scheduled Meds:  amphetamine-dextroamphetamine  30 mg Oral Q breakfast   apixaban  2.5 mg Oral BID   bacitracin   Topical Daily   bisacodyl  10 mg Oral Daily   docusate sodium  100 mg Oral Daily   escitalopram  10 mg Oral Daily   ferrous sulfate  325 mg Oral Q breakfast   Gerhardt's butt cream   Topical Daily   guaiFENesin  1,200 mg Oral BID   Ipratropium-Albuterol  1 puff Inhalation Q6H   polyethylene glycol  17 g Oral BID   spironolactone  25 mg Oral Daily   torsemide  40 mg Oral BID   Continuous Infusions:   LOS: 94 days    Tresa Moore, MD [facility]   If 7PM-7AM, please contact night-coverage  03/04/2023, 11:12 AM
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
## 2023-03-04 NOTE — Progress Notes (Signed)
Patient rang call light and ask for someone to come in the room. Entered room and patient asking who the tech was. Told the patient the tech was busy, patient stated he needs to be pulled up. Patient stated no one has been in here all night. Asked patient if I could get him pulled up, started to tilt patient bed so that he could be pulled up. Patient stated then why isn't my thing emptied, referring to his suction canister. Told patient I would empty it when I was done pulling him up. Patient pulled himself up and bed returned upright. Emptied canister. Patient stated he wanted some ice water, I told patient I would bring to him as soon as I am done. Patient stated he will just call the charge nurse then. Exited patient room and patient called charge nurse, charge nurse was in the bathroom so phone answered and patient informed that charge nurse was busy at the moment. Patient started screaming hey loudly and charge nurses name that could be heard from the nurses station. Security rounding at this time and sent to patients room.
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
## 2023-03-04 NOTE — Plan of Care (Signed)

## 2023-03-04 NOTE — Progress Notes (Signed)
Author was in another pt room and pt started yelling author's name. Went to the room and pt is complaining about his external catheter full and that he only wants ice water. Security heard pt yelling from the hallway and came in the room.
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
## 2023-03-05 DIAGNOSIS — J9602 Acute respiratory failure with hypercapnia: Secondary | ICD-10-CM | POA: Diagnosis not present

## 2023-03-05 DIAGNOSIS — J441 Chronic obstructive pulmonary disease with (acute) exacerbation: Secondary | ICD-10-CM | POA: Diagnosis not present

## 2023-03-05 DIAGNOSIS — E8770 Fluid overload, unspecified: Secondary | ICD-10-CM | POA: Diagnosis not present

## 2023-03-05 DIAGNOSIS — J9601 Acute respiratory failure with hypoxia: Secondary | ICD-10-CM | POA: Diagnosis not present

## 2023-03-05 DIAGNOSIS — I5033 Acute on chronic diastolic (congestive) heart failure: Secondary | ICD-10-CM | POA: Diagnosis not present

## 2023-03-05 NOTE — TOC Progression Note (Signed)
Transition of Care [facility]) - Progression Note    Patient Details  Name: Gary [name] MRN: [id number] Date of Birth: 03-21-1956  Transition of Care [facility]) CM/SW Contact  [name], [place] Phone Number: 03/05/2023, 3:34 PM  Clinical Narrative:     CSW reviewed trach accepting snf list in [place]:  [facility] in [place] [phone] Lvm with Lowella Bandy with admissions, notes hx indicate weight limit for patients is 350lb   [facility] in [place] [phone] Spoke with admissions who reports weight limit is 350 lb  [facility] - per chart review they have reported weight limit is 350 lb  Patient noted to have hx of [employer] [facility] in [place] CSW called them at [phone] lvm with admissions.   Patient's most current weight documented from 11/17 indicates 408 lb which is a barrier to placement in snfs that accept trach patients.    Expected Discharge Plan: Home w Home Health Services Barriers to Discharge: Continued Medical Work up  Expected Discharge Plan and Services     Post Acute Care Choice: Home Health   Expected Discharge Date: 02/26/23               DME Arranged: Bedside commode DME Agency: [employer] Date DME Agency Contacted: 01/12/23 Time DME Agency Contacted: 1427 Representative spoke with at DME Agency: [facility] Arranged: PT, OT HH Agency: [facility] Date [facility] Agency Contacted: 01/12/23 Time HH Agency Contacted: 1428 Representative spoke with at [facility] Agency: Kandee Keen   Social Determinants of Health (SDOH) Interventions SDOH Screenings   Food Insecurity: No Food Insecurity (12/01/2022)  Housing: Patient Declined (12/01/2022)  Transportation Needs: No Transportation Needs (12/01/2022)  Utilities: Not At Risk (12/01/2022)  Depression (PHQ2-9): Low Risk  (06/07/2021)  Financial Resource Strain: High Risk (11/21/2022)   Received from [facility]  Physical Activity: Insufficiently Active (05/03/2017)  Social Connections:  Patient Declined (11/20/2022)   Received from [facility]  Stress: Stress Concern Present (11/20/2022)   Received from [facility]  Tobacco Use: Medium Risk (02/13/2023)    Readmission Risk Interventions    11/05/2022    2:59 PM 11/05/2022   12:16 PM 12/31/2021    8:58 AM  Readmission Risk Prevention Plan  Transportation Screening Complete Complete Complete  Medication Review [employer]) Complete Complete Complete  PCP or Specialist appointment within 3-5 days of discharge Complete Complete Complete  HRI or Home Care Consult Complete Complete Complete  SW Recovery Care/Counseling Consult Complete Complete Complete  Palliative Care Screening Complete Complete Complete  Skilled Nursing Facility Not Applicable Not Applicable Not Applicable

## 2023-03-05 NOTE — TOC Progression Note (Cosign Needed)
Transition of Care [facility]) - Progression Note    Patient Details  Name: Gary [name] MRN: [id number] Date of Birth: 06/11/1955  [facility]) CM/SW Contact  Marlowe Sax, RN Phone Number: 03/05/2023, 12:10 PM  Clinical Narrative:     Attempted to call [facility] to see if they can accept the patient for rehab, they have declined the patient for STR  Expected Discharge Plan: Home w Home Health Services Barriers to Discharge: Continued Medical Work up  Expected Discharge Plan and Services     Post Acute Care Choice: Home Health   Expected Discharge Date: 02/26/23               DME Arranged: Bedside commode DME Agency: [employer] Date DME Agency Contacted: 01/12/23 Time DME Agency Contacted: 1427 Representative spoke with at DME Agency: [facility] HH Arranged: PT, OT HH Agency: [facility] Date [facility] Agency Contacted: 01/12/23 Time HH Agency Contacted: 1428 Representative spoke with at [facility] Agency: Kandee Keen   Social Determinants of Health (SDOH) Interventions SDOH Screenings   Food Insecurity: No Food Insecurity (12/01/2022)  Housing: Patient Declined (12/01/2022)  Transportation Needs: No Transportation Needs (12/01/2022)  Utilities: Not At Risk (12/01/2022)  Depression (PHQ2-9): Low Risk  (06/07/2021)  Financial Resource Strain: High Risk (11/21/2022)   Received from [facility]  Physical Activity: Insufficiently Active (05/03/2017)  Social Connections: Patient Declined (11/20/2022)   Received from [facility]  Stress: Stress Concern Present (11/20/2022)   Received from [facility]  Tobacco Use: Medium Risk (02/13/2023)    Readmission Risk Interventions    11/05/2022    2:59 PM 11/05/2022   12:16 PM 12/31/2021    8:58 AM  Readmission Risk Prevention Plan  Transportation Screening Complete Complete Complete  Medication Review Oceanographer) Complete Complete Complete  PCP or Specialist appointment within 3-5 days of discharge Complete Complete  Complete  HRI or Home Care Consult Complete Complete Complete  SW Recovery Care/Counseling Consult Complete Complete Complete  Palliative Care Screening Complete Complete Complete  Skilled Nursing Facility Not Applicable Not Applicable Not Applicable

## 2023-03-05 NOTE — Plan of Care (Signed)
  Problem: Health Behavior/Discharge Planning: Goal: Ability to manage health-related needs will improve Outcome: Progressing   Problem: Clinical Measurements: Goal: Will remain free from infection Outcome: Progressing Goal: Respiratory complications will improve Outcome: Progressing   Problem: Coping: Goal: Level of anxiety will decrease Outcome: Progressing   Problem: Elimination: Goal: Will not experience complications related to bowel motility Outcome: Progressing   Problem: Safety: Goal: Ability to remain free from injury will improve Outcome: Progressing   Problem: Skin Integrity: Goal: Risk for impaired skin integrity will decrease Outcome: Progressing

## 2023-03-05 NOTE — Progress Notes (Signed)
Mobility Specialist - Progress Note   03/05/23 1529  Mobility  Activity Transferred from chair to bed  Level of Assistance +2 (takes two people)  Assistive Device  Water quality scientist lift)  Activity Response Tolerated well  $Mobility charge 1 Mobility  Mobility Specialist Start Time (ACUTE ONLY) 1433  Mobility Specialist Stop Time (ACUTE ONLY) 1443  Mobility Specialist Time Calculation (min) (ACUTE ONLY) 10 min   Pt transferred chair to bed via [employer] lift +2 for safety. Pt left supine with needs within reach.  Zetta Bills Mobility Specialist 03/05/23 3:31 PM
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
## 2023-03-05 NOTE — Progress Notes (Signed)
Mobility Specialist - Progress Note  During mobility: HR 104, SpO2 94%   03/05/23 1023  Mobility  Activity Ambulated with assistance in hallway  Level of Assistance Standby assist, set-up cues, supervision of patient - no hands on  Assistive Device Front wheel walker  Distance Ambulated (ft) 100 ft  Activity Response Tolerated well  $Mobility charge 1 Mobility  Mobility Specialist Start Time (ACUTE ONLY) 0919  Mobility Specialist Stop Time (ACUTE ONLY) 1004  Mobility Specialist Time Calculation (min) (ACUTE ONLY) 45 min   Pt supine upon entry, utilizing 2L [place]. Pt completed bed mob ModI, STS to RW from elevated bed height-- expressed dizziness upon transfer to the EOB, extra time required while dizziness subsided. Pt placed on 4L [place] prior to amb, O2 >90%. Pt STS to RW and amb 100 ft in the hallway with supervision. During amb Pt stopped ~60 ft for a standing rest break due to feeling SOB and BUE weakness. Pt amb an additional 40 ft before sitting in the recliner and being wheeled outside of the room. Pt STS from recliner unsuccessful-- self limit, cueing for hand placement and body mechanics (nose over toes). Pt wheeled into room, transferred to bed via hoyer lift +2 for safety, left supine with needs within reach.  Zetta Bills Mobility Specialist 03/05/23 11:16 AM

## 2023-03-05 NOTE — Progress Notes (Signed)
PROGRESS NOTE    Gary [name]  ZOX:[id number] DOB: [birth year] DOA: 11/28/2022 PCP: Shayne Alken, MD    Brief Narrative:   From admission h and p Gary [name] is a 67 y.o. 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   Gary [name] states that he ran out of his home torsemide several days ago due to a mixup with his pharmacy.  Then over the last couple days, he has noticed increasing lower extremity swelling, shortness of breath and chest pain.  He states that he usually develops chest pain when he has "fluid on his lungs."  He notes that his girlfriend feels that his face has even become more edematous.  During this time, he noticed some edema in his bilateral hands that was much worse on the left hand with associated numbness and tingling but this has improved.  He denies any nausea, vomiting, abdominal pain.   Gary [name] states that at 1 point, his supplemental oxygen level was as low as 6, but more recently he wears between 6-8L depending on how he feels.   Hospital Course:    Extended hospital course secondary to placement issues, resolved when we were able to wean him off NIV (which SNF will not take) and instead use cpap. He was declined by long-term care. He has had frequent admissions and remains at high risk for readmission.  11/13: Skilled nursing facility elected not to accept patient citing presence of tracheostomy as contraindication to admission.  TOC was informed of this only after patient had been accepted and we had insurance authorization.  Assessment & Plan:   Principal Problem:   Acute on chronic diastolic CHF (congestive heart failure) ([name]) Active Problems:   Acute on chronic respiratory failure with hypoxia and hypercapnia ([name])   COPD (chronic  obstructive pulmonary disease) ([name])   Acute kidney injury superimposed on CKD ([name])   OSA (obstructive sleep apnea)   Anxiety   Hypotension   Chest pain   Morbid obesity with BMI of 50.0-59.9, adult ([name])   Generalized weakness   Chronic colitis   Fluid overload   Eyelid cyst, right   Dry skin   Tracheitis  Acute on chronic diastolic CHF (congestive heart failure) ([name]) Continue torsemide , spironolactone.  Torsemide back to home dose.  Patient appears euvolemic.  Difficult to ascertain given body habitus.  Acute on chronic respiratory failure with hypoxia and hypercapnia ([name]) Trach Dependent Pseudomonas tracheitis On trach collar. S/p abx course for pseudomonas tracheitis Also evaluated for non-massive hemoptysis with CT soft tissue of neck done on 02/08/2023 .  Tracheostomy tube is in place and there is no definite collection around the tube.  There is thinning of the wall between the esophagus and trachea with possible defect. Stable on 2 L nasal cannula.  Medically stable for discharge.  We do not have a discharge plan  COPD (chronic obstructive pulmonary disease) ([name]) Severe COPD on Combivent, as needed albuterol.  On yuperli nebulizer.   Acute kidney injury superimposed on CKD ([name]) Creat stable at ~1.5 - monitor periodically   Anxiety On Atarax as needed.  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   Chronic colitis Stable Continue home mesalamine   Generalized weakness Patient has been  motivated and working with physical therapy and mobility specialist.  Continue ambulating and exercises much as tolerated. Patient is appropriate for discharge to inpatient rehab versus skilled nursing facility   Morbid obesity with BMI of 50.0-59.9, adult ([name]) Complicates above care   Constipation Reported on 11/17 Advised nurse to administer as needed lactulose Successful.  Patient having BM   DVT  prophylaxis: Lovenox Code Status: Full Family Communication: None Disposition Plan: Status is: Inpatient Remains inpatient appropriate because: Unsafe discharge plan.  Patient medically appropriate for discharge   Level of care: Med-Surg  Consultants:  None  Procedures:  None  Antimicrobials: None   Subjective: Seen and examined.  Lactulose was effective yesterday.  Successful BM.  No new complaints.  Objective: Vitals:   03/04/23 1717 03/04/23 2334 03/05/23 0733 03/05/23 0838  BP: 109/74 109/77 101/73   Pulse: 84 84 79   Resp: 18 20 16    Temp: 98 F (36.7 C) 98 F (36.7 C) 98.1 F (36.7 C)   TempSrc:   Oral   SpO2: 94% 96% 94% 93%  Weight:      Height:        Intake/Output Summary (Last 24 hours) at 03/05/2023 1207 Last data filed at 03/05/2023 0500 Gross per 24 hour  Intake 500 ml  Output 2200 ml  Net -1700 ml   Filed Weights   02/28/23 1515 03/03/23 0703  Weight: (!) 185.4 kg (!) 185.4 kg    Examination:  General exam: NAD Respiratory system: Diminished at bases.  Normal work of breathing.  2 L trach mask Cardiovascular system: S1-S2, RRR, no murmurs, trace pedal edema Gastrointestinal system: Obese, soft, NT/ND, normal bowel sounds Central nervous system: Alert and oriented. No focal neurological deficits. Extremities: Decreased power lower extremities.  Gait not assessed Skin: No rashes, lesions or ulcers Psychiatry: Judgement and insight appear normal. Mood & affect appropriate.     Data Reviewed: I have personally reviewed following labs and imaging studies  CBC: No results for input(s): "WBC", "NEUTROABS", "HGB", "HCT", "MCV", "PLT" in the last 168 hours.  Basic Metabolic Panel: Recent Labs  Lab 02/27/23 1018  NA 136  K 3.4*  CL 97*  CO2 30  GLUCOSE 181*  BUN 39*  CREATININE 1.67*  CALCIUM 8.4*   GFR: Estimated Creatinine Clearance: 78.7 mL/min (A) (by C-G formula based on SCr of 1.67 mg/dL (H)). Liver Function Tests: No  results for input(s): "AST", "ALT", "ALKPHOS", "BILITOT", "PROT", "ALBUMIN" in the last 168 hours. No results for input(s): "LIPASE", "AMYLASE" in the last 168 hours. No results for input(s): "AMMONIA" in the last 168 hours. Coagulation Profile: No results for input(s): "INR", "PROTIME" in the last 168 hours. Cardiac Enzymes: No results for input(s): "CKTOTAL", "CKMB", "CKMBINDEX", "TROPONINI" in the last 168 hours. BNP (last 3 results) No results for input(s): "PROBNP" in the last 8760 hours. HbA1C: No results for input(s): "HGBA1C" in the last 72 hours. CBG: No results for input(s): "GLUCAP" in the last 168 hours. Lipid Profile: No results for input(s): "CHOL", "HDL", "LDLCALC", "TRIG", "CHOLHDL", "LDLDIRECT" in the last 72 hours. Thyroid Function Tests: No results for input(s): "TSH", "T4TOTAL", "FREET4", "T3FREE", "THYROIDAB" in the last 72 hours. Anemia Panel: No results for input(s): "VITAMINB12", "FOLATE", "FERRITIN", "TIBC", "IRON", "RETICCTPCT" in the last 72 hours. Sepsis Labs: No results for input(s): "PROCALCITON", "LATICACIDVEN" in the last 168 hours.  No results found for this or any previous visit (from the past 240 hour(s)).       Radiology Studies: No results found.  Scheduled Meds:  amphetamine-dextroamphetamine  30 mg Oral Q breakfast   apixaban  2.5 mg Oral BID   bacitracin   Topical Daily   bisacodyl  10 mg Oral Daily   docusate sodium  100 mg Oral Daily   escitalopram  10 mg Oral Daily   ferrous sulfate  325 mg Oral Q breakfast   Gerhardt's butt cream   Topical Daily   guaiFENesin  1,200 mg Oral BID   Ipratropium-Albuterol  1 puff Inhalation Q6H   polyethylene glycol  17 g Oral BID   spironolactone  25 mg Oral Daily   torsemide  40 mg Oral BID   Continuous Infusions:   LOS: 95 days    Tresa Moore, MD [facility]   If 7PM-7AM, please contact night-coverage  03/05/2023, 12:07 PM

## 2023-03-05 NOTE — Care Management Important Message (Signed)
Important Message  Patient Details  Name: Gary [name] MRN: [id number] Date of Birth: 11-05-1955   Important Message Given:  Yes - Medicare IM     Ramari Bray, [name] 03/05/2023, 10:40 AM

## 2023-03-05 NOTE — Plan of Care (Signed)

## 2023-03-05 NOTE — Progress Notes (Signed)
Occupational Therapy Treatment Patient Details Name: Gary [name] MRN: [id number] DOB: 09/03/1955 Today's Date: 03/05/2023   History of present illness 67 y/o male presented to [facility] ED on 11/28/22 for chest pain x 4 days. Admitted for acute on chronic CHF. Frequent admissions this year with most recent dsicharge on 11/21/22. PMH includes obesity, hypoxia, respiratory failure with tracheostomy, COPD, GIB, HFpEF.   OT comments  Mr Bolan was seen for OT treatment on this date. Upon arrival to room pt in bed, agreeable to tx. Pt requires SUPERVISION standing from max elevated bed height. Tolerates ~60 ft mobility prior to sitting citing L knee pain. Reviewed seated and supine therex. Pt making good progress toward goals, will continue to follow POC. Discharge recommendation remains appropriate.       If plan is discharge home, recommend the following:  A little help with walking and/or transfers;A lot of help with bathing/dressing/bathroom   Equipment Recommendations  BSC/3in1    Recommendations for Other Services      Precautions / Restrictions Precautions Precautions: Fall Restrictions Weight Bearing Restrictions: No       Mobility Bed Mobility Overal bed mobility: Modified Independent                  Transfers Overall transfer level: Needs assistance Equipment used: Rolling walker (2 wheels) Transfers: Sit to/from Stand Sit to Stand: Supervision, From elevated surface                 Balance Overall balance assessment: Needs assistance Sitting-balance support: No upper extremity supported, Feet supported Sitting balance-[name] Scale: Normal     Standing balance support: Bilateral upper extremity supported Standing balance-[name] Scale: Fair                             ADL either performed or assessed with clinical judgement   ADL Overall ADL's : Needs assistance/impaired                                       General ADL  Comments: MAX A don B shoes in sitting      Cognition Arousal: Alert Behavior During Therapy: WFL for tasks assessed/performed Overall Cognitive Status: Within Functional Limits for tasks assessed                                                     Pertinent Vitals/ Pain       Pain Assessment Pain Assessment: Faces Faces Pain Scale: Hurts a little bit Pain Location: L knee Pain Descriptors / Indicators: Sore Pain Intervention(s): Limited activity within patient's tolerance, Repositioned   Frequency  Min 1X/week        Progress Toward Goals  OT Goals(current goals can now be found in the care plan section)  Progress towards OT goals: Progressing toward goals  Acute Rehab OT Goals Patient Stated Goal: to get stronger OT Goal Formulation: With patient Time For Goal Achievement: 03/30/23 Potential to Achieve Goals: Good ADL Goals Pt Will Perform Grooming: with modified independence;standing Pt Will Transfer to Toilet: with modified independence;ambulating;regular height toilet Pt Will Perform Toileting - Clothing Manipulation and hygiene: with mod assist;sit to/from stand Additional ADL Goal #1: Pt will stand from the  hospital bed or recliner adn LR DME with min A as a precursor to ADLs  Plan Discharge plan remains appropriate;Frequency remains appropriate    Co-evaluation                 AM-PAC OT "6 Clicks" Daily Activity     Outcome Measure   Help from another person eating meals?: None Help from another person taking care of personal grooming?: A Little Help from another person toileting, which includes using toliet, bedpan, or urinal?: A Lot Help from another person bathing (including washing, rinsing, drying)?: A Lot Help from another person to put on and taking off regular upper body clothing?: None Help from another person to put on and taking off regular lower body clothing?: A Lot 6 Click Score: 17    End of Session  Equipment Utilized During Treatment: Oxygen;Rolling walker (2 wheels)  OT Visit Diagnosis: Unsteadiness on feet (R26.81);Other abnormalities of gait and mobility (R26.89);Muscle weakness (generalized) (M62.81);Adult, failure to thrive (R62.7)   Activity Tolerance Patient tolerated treatment well   Patient Left in chair;with call bell/phone within reach   Nurse Communication          Time: 6295-2841 OT Time Calculation (min): 21 min  Charges: OT General Charges $OT Visit: 1 Visit OT Treatments $Therapeutic Activity: 8-22 mins  Kathie Dike, M.S. OTR/L  03/05/23, 1:53 PM  ascom [phone]

## 2023-03-06 DIAGNOSIS — J9601 Acute respiratory failure with hypoxia: Secondary | ICD-10-CM | POA: Diagnosis not present

## 2023-03-06 DIAGNOSIS — E8770 Fluid overload, unspecified: Secondary | ICD-10-CM | POA: Diagnosis not present

## 2023-03-06 DIAGNOSIS — I5033 Acute on chronic diastolic (congestive) heart failure: Secondary | ICD-10-CM | POA: Diagnosis not present

## 2023-03-06 DIAGNOSIS — J9602 Acute respiratory failure with hypercapnia: Secondary | ICD-10-CM | POA: Diagnosis not present

## 2023-03-06 DIAGNOSIS — J441 Chronic obstructive pulmonary disease with (acute) exacerbation: Secondary | ICD-10-CM | POA: Diagnosis not present

## 2023-03-06 LAB — BASIC METABOLIC PANEL
Anion gap: 6 (ref 5–15)
BUN: 35 mg/dL — ABNORMAL HIGH (ref 8–23)
CO2: 31 mmol/L (ref 22–32)
Calcium: 8.5 mg/dL — ABNORMAL LOW (ref 8.9–10.3)
Chloride: 101 mmol/L (ref 98–111)
Creatinine, Ser: 1.66 mg/dL — ABNORMAL HIGH (ref 0.61–1.24)
GFR, Estimated: 45 mL/min — ABNORMAL LOW (ref >60)
Glucose, Bld: 101 mg/dL — ABNORMAL HIGH (ref 70–99)
Potassium: 3.8 mmol/L (ref 3.5–5.1)
Sodium: 138 mmol/L (ref 135–145)

## 2023-03-06 LAB — CBC WITH DIFFERENTIAL/PLATELET
Abs Immature Granulocytes: 0.04 10*3/uL (ref 0.00–0.07)
Basophils Absolute: 0 10*3/uL (ref 0.0–0.1)
Basophils Relative: 1 %
Eosinophils Absolute: 0.2 10*3/uL (ref 0.0–0.5)
Eosinophils Relative: 2 %
HCT: 39.9 % (ref 39.0–52.0)
Hemoglobin: 11.2 g/dL — ABNORMAL LOW (ref 13.0–17.0)
Immature Granulocytes: 1 %
Lymphocytes Relative: 19 %
Lymphs Abs: 1.6 10*3/uL (ref 0.7–4.0)
MCH: 21.3 pg — ABNORMAL LOW (ref 26.0–34.0)
MCHC: 28.1 g/dL — ABNORMAL LOW (ref 30.0–36.0)
MCV: 75.7 fL — ABNORMAL LOW (ref 80.0–100.0)
Monocytes Absolute: 0.8 10*3/uL (ref 0.1–1.0)
Monocytes Relative: 9 %
Neutro Abs: 5.7 10*3/uL (ref 1.7–7.7)
Neutrophils Relative %: 68 %
Platelets: 224 10*3/uL (ref 150–400)
RBC: 5.27 MIL/uL (ref 4.22–5.81)
RDW: 18.6 % — ABNORMAL HIGH (ref 11.5–15.5)
WBC: 8.3 10*3/uL (ref 4.0–10.5)
nRBC: 0.2 % (ref 0.0–0.2)

## 2023-03-06 LAB — TSH: TSH: 1.379 u[IU]/mL (ref 0.350–4.500)

## 2023-03-06 MED ORDER — HYDRALAZINE HCL 20 MG/ML IJ SOLN: 10.0000 mg | INTRAMUSCULAR | Status: DC | PRN

## 2023-03-06 MED ORDER — ACETAMINOPHEN 325 MG PO TABS
650.0000 mg | ORAL_TABLET | Freq: Four times a day (QID) | ORAL | Status: DC | PRN
  Administered 2023-03-07 – 2023-04-09 (×7): 650 mg via ORAL
  Filled 2023-03-06 (×8): qty 2

## 2023-03-06 MED ORDER — TRAZODONE HCL 50 MG PO TABS
50.0000 mg | ORAL_TABLET | Freq: Every evening | ORAL | Status: DC | PRN
  Administered 2023-03-15 – 2023-04-22 (×10): 50 mg via ORAL
  Filled 2023-03-06 (×11): qty 1

## 2023-03-06 MED ORDER — IPRATROPIUM-ALBUTEROL 0.5-2.5 (3) MG/3ML IN SOLN: 3.0000 mL | RESPIRATORY_TRACT | Status: DC | PRN

## 2023-03-06 MED ORDER — SENNOSIDES-DOCUSATE SODIUM 8.6-50 MG PO TABS: 1.0000 | ORAL_TABLET | Freq: Every evening | ORAL | Status: DC | PRN

## 2023-03-06 MED ORDER — OXYCODONE HCL 5 MG PO TABS
5.0000 mg | ORAL_TABLET | Freq: Two times a day (BID) | ORAL | Status: DC | PRN
  Administered 2023-03-06 – 2023-04-23 (×45): 5 mg via ORAL
  Filled 2023-03-06 (×48): qty 1

## 2023-03-06 NOTE — TOC Progression Note (Addendum)
Transition of Care [facility]) - Progression Note    Patient Details  Name: Gary [name] MRN: [id number] Date of Birth: 01/26/1956  Transition of Care [facility]) CM/SW Contact  Marlowe Sax, RN Phone Number: 03/06/2023, 3:56 PM  Clinical Narrative:     Sherron Monday with Neysa Bonito at [facility] I explained to her that the patient has made great progress, he does have the trach in place but not in use, I explained that we have gotten his weight down to 408, he is making great progress with PT and we are looking for STR to see if they can accept the patient for STR, she will look it over and call me back I resent out the referral with a message including his progress and that he is not using the trach any longer and hope to get a STR bed soon  Expected Discharge Plan: Home w Home Health Services Barriers to Discharge: Continued Medical Work up  Expected Discharge Plan and Services     Post Acute Care Choice: Home Health   Expected Discharge Date: 02/26/23               DME Arranged: Bedside commode DME Agency: [employer] Date DME Agency Contacted: 01/12/23 Time DME Agency Contacted: 1427 Representative spoke with at DME Agency: [facility] Arranged: PT, OT HH Agency: [facility] Date [facility] Agency Contacted: 01/12/23 Time HH Agency Contacted: 1428 Representative spoke with at [place] Agency: Kandee Keen   Social Determinants of Health (SDOH) Interventions SDOH Screenings   Food Insecurity: No Food Insecurity (12/01/2022)  Housing: Patient Declined (12/01/2022)  Transportation Needs: No Transportation Needs (12/01/2022)  Utilities: Not At Risk (12/01/2022)  Depression (PHQ2-9): Low Risk  (06/07/2021)  Financial Resource Strain: High Risk (11/21/2022)   Received from [facility]  Physical Activity: Insufficiently Active (05/03/2017)  Social Connections: Patient Declined (11/20/2022)   Received from [facility]  Stress: Stress Concern Present (11/20/2022)   Received from [facility]  Tobacco  Use: Medium Risk (02/13/2023)    Readmission Risk Interventions    11/05/2022    2:59 PM 11/05/2022   12:16 PM 12/31/2021    8:58 AM  Readmission Risk Prevention Plan  Transportation Screening Complete Complete Complete  Medication Review Oceanographer) Complete Complete Complete  PCP or Specialist appointment within 3-5 days of discharge Complete Complete Complete  HRI or Home Care Consult Complete Complete Complete  SW Recovery Care/Counseling Consult Complete Complete Complete  Palliative Care Screening Complete Complete Complete  Skilled Nursing Facility Not Applicable Not Applicable Not Applicable

## 2023-03-06 NOTE — Progress Notes (Signed)
PROGRESS NOTE    Gary [name]  OZH:[id number] DOB: 1955/10/20 DOA: 11/28/2022 PCP: Shayne Alken, MD     Brief Narrative:  Gary [name] is a 67 y.o. 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  Patient initially ran out of torsemide and was found to be in CHF exacerbation.  Eventually became euvolemic and transition to p.o. medications.  Patient has been on trach collar status post course of antibiotics for Pseudomonas tracheitis.  Overall stable on 2 L nasal cannula.  Currently awaiting safe disposition Difficult to place due to tracheostomy   Assessment & Plan:   Principal Problem:   Acute on chronic diastolic CHF (congestive heart failure) ([name]) Active Problems:   Acute on chronic respiratory failure with hypoxia and hypercapnia ([name])   COPD (chronic obstructive pulmonary disease) ([name])   Acute kidney injury superimposed on CKD ([name])   OSA (obstructive sleep apnea)   Anxiety   Hypotension   Chest pain   Morbid obesity with BMI of 50.0-59.9, adult ([name])   Generalized weakness   Chronic colitis   Fluid overload   Eyelid cyst, right   Dry skin   Tracheitis   Acute on chronic diastolic CHF (congestive heart failure) ([name]) Initially admitted for volume overloaded.  Now appears to be euvolemic.  Currently patient is on Aldactone, torsemide.   Acute on chronic respiratory failure with hypoxia and hypercapnia ([name]) Trach Dependent Pseudomonas tracheitis On trach collar. S/p abx course for pseudomonas tracheitis Also evaluated for non-massive hemoptysis with CT soft tissue of neck done on 02/08/2023 .  Tracheostomy tube is in place and there is no definite collection around the tube.  There is thinning of the wall between the esophagus and trachea with possible  defect. Currently stable on 2 L nasal cannula   COPD (chronic obstructive pulmonary disease) ([name]) Severe COPD on Combivent, as needed albuterol.  On yuperli nebulizer.   Acute kidney injury superimposed on CKD ([name]) Creatinine stable around 1.5   Anxiety On Atarax as needed.  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   Chronic colitis Stable Continue home mesalamine   Generalized weakness Patient has been motivated and working with physical therapy and mobility specialist.  Continue ambulating and exercises much as tolerated. Patient is appropriate for discharge to inpatient rehab versus skilled nursing facility   Morbid obesity with BMI of 50.0-59.9, adult ([name]) Complicates above care   Constipation Reported on 11/17 Advised nurse to administer as needed lactulose Successful.  Patient having BM     DVT prophylaxis: Lovenox Code Status: Full Family Communication: None Disposition Plan: Status is: Inpatient Remains inpatient appropriate because: Unsafe discharge plan.  Patient medically appropriate for discharge    Subjective: Seen at bedside, no new complaints.    Examination:   General exam: NAD Respiratory system: Diminished at bases.  Normal work of breathing.  2 L trach mask Cardiovascular system: S1-S2, RRR, no murmurs, trace pedal edema Gastrointestinal system: Obese, soft, NT/ND, normal bowel sounds Central nervous system: Alert and oriented. No focal neurological deficits. Extremities: Decreased power lower extremities.  Gait not assessed Skin: No rashes, lesions or ulcers Psychiatry: Judgement and insight appear normal. Mood & affect appropriate.  Pressure Injury 09/27/22 Buttocks Left;Medial;Upper Stage 2 -  Partial thickness loss of dermis presenting as a shallow open injury with a red, pink wound bed without slough. Left buttocks small stage 2 (Active)   09/27/22 1130  Location: Buttocks  Location Orientation: Left;Medial;Upper  Staging: Stage 2 -  Partial thickness loss of dermis presenting as a shallow open injury with a red, pink wound bed without slough.  Wound Description (Comments): Left buttocks small stage 2  Present on Admission: Yes     Diet Orders (From admission, onward)     Start     Ordered   02/27/23 1247  Diet 2 gram sodium Fluid consistency: Thin  Diet effective now       Question:  Fluid consistency:  Answer:  Thin   02/27/23 1246   02/26/23 0000  Diet - low sodium heart healthy        02/26/23 1019            Objective: Vitals:   03/05/23 1528 03/05/23 2326 03/06/23 0500 03/06/23 0832  BP: 109/77 100/71  105/73  Pulse: 85 83  81  Resp: 16 17  14   Temp: 98.3 F (36.8 C) 98.1 F (36.7 C)  98.3 F (36.8 C)  TempSrc: Oral     SpO2: 97% 97%  92%  Weight:   (!) 189.3 kg   Height:        Intake/Output Summary (Last 24 hours) at 03/06/2023 1259 Last data filed at 03/06/2023 0300 Gross per 24 hour  Intake --  Output 2125 ml  Net -2125 ml   Filed Weights   02/28/23 1515 03/03/23 0703 03/06/23 0500  Weight: (!) 185.4 kg (!) 185.4 kg (!) 189.3 kg    Scheduled Meds:  amphetamine-dextroamphetamine  30 mg Oral Q breakfast   apixaban  2.5 mg Oral BID   bacitracin   Topical Daily   bisacodyl  10 mg Oral Daily   docusate sodium  100 mg Oral Daily   escitalopram  10 mg Oral Daily   ferrous sulfate  325 mg Oral Q breakfast   Gerhardt's butt cream   Topical Daily   guaiFENesin  1,200 mg Oral BID   Ipratropium-Albuterol  1 puff Inhalation Q6H   polyethylene glycol  17 g Oral BID   spironolactone  25 mg Oral Daily   torsemide  40 mg Oral BID   Continuous Infusions:  Nutritional status     Body mass index is 50.8 kg/m.  Data Reviewed:   CBC: Recent Labs  Lab 03/06/23 0840  WBC 8.3  NEUTROABS 5.7  HGB 11.2*  HCT 39.9  MCV 75.7*  PLT 224   Basic Metabolic Panel: Recent Labs  Lab  03/06/23 0840  NA 138  K 3.8  CL 101  CO2 31  GLUCOSE 101*  BUN 35*  CREATININE 1.66*  CALCIUM 8.5*   GFR: Estimated Creatinine Clearance: 80.2 mL/min (A) (by C-G formula based on SCr of 1.66 mg/dL (H)). Liver Function Tests: No results for input(s): "AST", "ALT", "ALKPHOS", "BILITOT", "PROT", "ALBUMIN" in the last 168 hours. No results for input(s): "LIPASE", "AMYLASE" in the last 168 hours. No results for input(s): "AMMONIA" in the last 168 hours. Coagulation Profile: No results for input(s): "INR", "PROTIME" in the last 168 hours. Cardiac Enzymes: No results for input(s): "CKTOTAL", "CKMB", "CKMBINDEX", "TROPONINI" in the last 168 hours. BNP (last 3 results) No results for input(s): "PROBNP" in the last 8760 hours. HbA1C: No results for input(s): "HGBA1C" in the last 72  hours. CBG: No results for input(s): "GLUCAP" in the last 168 hours. Lipid Profile: No results for input(s): "CHOL", "HDL", "LDLCALC", "TRIG", "CHOLHDL", "LDLDIRECT" in the last 72 hours. Thyroid Function Tests: Recent Labs    03/06/23 0834  TSH 1.379   Anemia Panel: No results for input(s): "VITAMINB12", "FOLATE", "FERRITIN", "TIBC", "IRON", "RETICCTPCT" in the last 72 hours. Sepsis Labs: No results for input(s): "PROCALCITON", "LATICACIDVEN" in the last 168 hours.  No results found for this or any previous visit (from the past 240 hour(s)).       Radiology Studies: No results found.         LOS: 96 days   Time spent= 35 mins    Miguel Rota, MD [facility]  If 7PM-7AM, please contact night-coverage  03/06/2023, 12:59 PM

## 2023-03-06 NOTE — Progress Notes (Signed)
PT Cancellation Note  Patient Details Name: Gary [name] MRN: [id number] DOB: 08/13/1955   Cancelled Treatment:    Reason Eval/Treat Not Completed: Patient declined to participate with PT services this date despite encouragement secondary to LLE pain that he has received pain meds for per pt report.  Will attempt to see pt at a future date/time as medically appropriate.    Ovidio Hanger PT, DPT 03/06/23, 2:17 PM
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
## 2023-03-06 NOTE — Plan of Care (Signed)

## 2023-03-07 ENCOUNTER — Inpatient Hospital Stay: Payer: Medicare HMO

## 2023-03-07 DIAGNOSIS — R079 Chest pain, unspecified: Secondary | ICD-10-CM | POA: Diagnosis not present

## 2023-03-07 DIAGNOSIS — J441 Chronic obstructive pulmonary disease with (acute) exacerbation: Secondary | ICD-10-CM | POA: Diagnosis not present

## 2023-03-07 DIAGNOSIS — E8770 Fluid overload, unspecified: Secondary | ICD-10-CM | POA: Diagnosis not present

## 2023-03-07 DIAGNOSIS — R0989 Other specified symptoms and signs involving the circulatory and respiratory systems: Secondary | ICD-10-CM | POA: Diagnosis not present

## 2023-03-07 DIAGNOSIS — I517 Cardiomegaly: Secondary | ICD-10-CM | POA: Diagnosis not present

## 2023-03-07 DIAGNOSIS — R0602 Shortness of breath: Secondary | ICD-10-CM | POA: Diagnosis not present

## 2023-03-07 DIAGNOSIS — J9601 Acute respiratory failure with hypoxia: Secondary | ICD-10-CM | POA: Diagnosis not present

## 2023-03-07 DIAGNOSIS — I5033 Acute on chronic diastolic (congestive) heart failure: Secondary | ICD-10-CM | POA: Diagnosis not present

## 2023-03-07 DIAGNOSIS — J9602 Acute respiratory failure with hypercapnia: Secondary | ICD-10-CM | POA: Diagnosis not present

## 2023-03-07 LAB — TROPONIN I (HIGH SENSITIVITY): Troponin I (High Sensitivity): 6 ng/L (ref <18)

## 2023-03-07 LAB — GLUCOSE, CAPILLARY: Glucose-Capillary: 129 mg/dL — ABNORMAL HIGH (ref 70–99)

## 2023-03-07 MED ORDER — DOCUSATE SODIUM 100 MG PO CAPS
100.0000 mg | ORAL_CAPSULE | Freq: Two times a day (BID) | ORAL | Status: DC
  Administered 2023-03-07 – 2023-04-07 (×41): 100 mg via ORAL
  Filled 2023-03-07 (×54): qty 1

## 2023-03-07 MED ORDER — FERROUS SULFATE 325 (65 FE) MG PO TABS
325.0000 mg | ORAL_TABLET | Freq: Every day | ORAL | Status: DC
  Administered 2023-03-08 – 2023-04-08 (×31): 325 mg via ORAL
  Filled 2023-03-07 (×31): qty 1

## 2023-03-07 MED ORDER — NITROGLYCERIN 0.4 MG SL SUBL
0.4000 mg | SUBLINGUAL_TABLET | SUBLINGUAL | Status: DC | PRN
  Administered 2023-03-07: 0.4 mg via SUBLINGUAL
  Filled 2023-03-07: qty 1
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
## 2023-03-07 NOTE — Progress Notes (Signed)
This leader called Tele to get a transcript of events. Per the Tele monitor Vanduser ate breakfast around 0800 and after eating breakfast he raised the head of his bed using the bed control and resumed lying with no movement nor distress noted until staff appeared in the room during the Rapid.

## 2023-03-07 NOTE — Plan of Care (Signed)

## 2023-03-07 NOTE — Progress Notes (Signed)
Responded to rapid response. Pt awake, alert. States was doing bed exercises and became short of breath. Maintaining airway. RN present, RR RN present. Dr. Nelson Chimes arrived.12 lead EKG done.

## 2023-03-07 NOTE — Progress Notes (Signed)
Pt c/o SOB, CP with left side arm tingling. "I feel like I can't catch my breath". Auscultated heart sounds. S 1, S 2. No abnormal beats heard. Notified Press photographer, Wallace Cullens, Charity fundraiser. Pt hesitated when asked if he would like for her to contact rapid response team. "I really don't want all that, I just can't catch my breath". "They are going to want to do an x-ray.".Decision made to call Rapid response team for further interventions. Pt assessed and evaluated. VS now stable. (See chart) Denies CP. Nitroglycerin given per provider orders. Awaiting X-ray & lab results. Pt stable with no c/o CP. Denies SOB.

## 2023-03-07 NOTE — Progress Notes (Signed)
   03/07/23 1000  Spiritual Encounters  Type of Visit Follow up  Care provided to: Patient  Referral source Code page  Reason for visit Code (Rapid Response)  OnCall Visit Yes   Chaplain responded to rapid response.  Patient being cared for by interdisciplinary team. No family at bedside.  Will follow as appropriate.

## 2023-03-07 NOTE — Progress Notes (Addendum)
OT Cancellation Note  Patient Details Name: Gary [name] MRN: [id number] DOB: 09/04/1955   Cancelled Treatment:    Reason Eval/Treat Not Completed: Patient declined, no reason specified. Pt declines session, reports needing more time to rest and recover from his morning chest pain. In acute distress at this time. Agreeable to session next date.   Kathie Dike, M.S. OTR/L  03/07/23, 1:25 PM  ascom [phone]

## 2023-03-07 NOTE — Progress Notes (Signed)
Mobility Specialist - Progress Note   03/07/23 1010  Mobility  Activity Contraindicated/medical hold (rapid called)   Pt supine upon entry, utilizing 2L [place]. Pt expressed discomfort of the the chest after completing 50 supine chest press with resistance bands prior to MS arrival. Pt expressed "feeling like I'm going to have a heart attack". RN notified.   Zetta Bills Mobility Specialist 03/07/23 10:40 AM

## 2023-03-07 NOTE — TOC Progression Note (Signed)
Transition of Care [facility]) - Progression Note    Patient Details  Name: Gary [name] MRN: [id number] Date of Birth: 1955/11/12  Transition of Care [facility]) CM/SW Contact  Marlowe Sax, RN Phone Number: 03/07/2023, 10:56 AM  Clinical Narrative:     [facility] here to assess the patient to see if they can accept him for STR  Expected Discharge Plan: Home w Home Health Services Barriers to Discharge: Continued Medical Work up  Expected Discharge Plan and Services     Post Acute Care Choice: Home Health   Expected Discharge Date: 02/26/23               DME Arranged: Bedside commode DME Agency: [employer] Date DME Agency Contacted: 01/12/23 Time DME Agency Contacted: 1427 Representative spoke with at DME Agency: [facility] Arranged: PT, OT HH Agency: [facility] Date [facility] Agency Contacted: 01/12/23 Time HH Agency Contacted: 1428 Representative spoke with at [facility] Agency: [facility] Keen   Social Determinants of Health (SDOH) Interventions SDOH Screenings   Food Insecurity: No Food Insecurity (12/01/2022)  Housing: Patient Declined (12/01/2022)  Transportation Needs: No Transportation Needs (12/01/2022)  Utilities: Not At Risk (12/01/2022)  Depression (PHQ2-9): Low Risk  (06/07/2021)  Financial Resource Strain: High Risk (11/21/2022)   Received from [facility]  Physical Activity: Insufficiently Active (05/03/2017)  Social Connections: Patient Declined (11/20/2022)   Received from [facility]  Stress: Stress Concern Present (11/20/2022)   Received from [facility]  Tobacco Use: Medium Risk (02/13/2023)    Readmission Risk Interventions    11/05/2022    2:59 PM 11/05/2022   12:16 PM 12/31/2021    8:58 AM  Readmission Risk Prevention Plan  Transportation Screening Complete Complete Complete  Medication Review Oceanographer) Complete Complete Complete  PCP or Specialist appointment within 3-5 days of discharge Complete Complete Complete  HRI or Home Care Consult  Complete Complete Complete  SW Recovery Care/Counseling Consult Complete Complete Complete  Palliative Care Screening Complete Complete Complete  Skilled Nursing Facility Not Applicable Not Applicable Not Applicable

## 2023-03-07 NOTE — TOC Progression Note (Signed)
Transition of Care [facility]) - Progression Note    Patient Details  Name: Gary [name] MRN: [id number] Date of Birth: [birth year]  Transition of Care [facility]) CM/SW Contact  Marlowe Sax, RN Phone Number: 03/07/2023, 11:30 AM  Clinical Narrative:    Met with the patient and let him know that we do have a bed offer with [facility], he stated that is where he came from and that they did not provide the needed care he requires, he said that they gave him processed food with a lot of sodium causing him to swell up even tho he was supposed to be on a low sodium diet, he stated that they did not care for his trach and did not have the care needed for a trach, he stated that he ended up back in the hospital three times from the care that they provided and he had major set backs from being there, he agrees that he needs STR but not from there   Expected Discharge Plan: Home w Home Health Services Barriers to Discharge: Continued Medical Work up  Expected Discharge Plan and Services     Post Acute Care Choice: Home Health   Expected Discharge Date: 02/26/23               DME Arranged: Bedside commode DME Agency: [employer] Date DME Agency Contacted: 01/12/23 Time DME Agency Contacted: 1427 Representative spoke with at DME Agency: [facility] Arranged: PT, OT HH Agency: [facility] Date [facility] Agency Contacted: 01/12/23 Time HH Agency Contacted: 1428 Representative spoke with at [facility] Agency: [facility] Keen   Social Determinants of Health (SDOH) Interventions SDOH Screenings   Food Insecurity: No Food Insecurity (12/01/2022)  Housing: Patient Declined (12/01/2022)  Transportation Needs: No Transportation Needs (12/01/2022)  Utilities: Not At Risk (12/01/2022)  Depression (PHQ2-9): Low Risk  (06/07/2021)  Financial Resource Strain: High Risk (11/21/2022)   Received from [facility]  Physical Activity: Insufficiently Active (05/03/2017)  Social Connections: Patient Declined (11/20/2022)    Received from [facility]  Stress: Stress Concern Present (11/20/2022)   Received from [facility]  Tobacco Use: Medium Risk (02/13/2023)    Readmission Risk Interventions    11/05/2022    2:59 PM 11/05/2022   12:16 PM 12/31/2021    8:58 AM  Readmission Risk Prevention Plan  Transportation Screening Complete Complete Complete  Medication Review Oceanographer) Complete Complete Complete  PCP or Specialist appointment within 3-5 days of discharge Complete Complete Complete  HRI or Home Care Consult Complete Complete Complete  SW Recovery Care/Counseling Consult Complete Complete Complete  Palliative Care Screening Complete Complete Complete  Skilled Nursing Facility Not Applicable Not Applicable Not Applicable

## 2023-03-07 NOTE — Progress Notes (Signed)
Rapid Response Event Note   Reason for Call : Chest pain   Initial Focused Assessment: Patient alert and oriented. Patient verbalized that he started having chest pain when doing bed exercises.  Upon my arrival patient denies chest pain.  Patient skin was dry, BP 101/74, RR 16, sp02 97% on 3L. Dr. Nelson Chimes had already arrived at bedside to assess patient.      Interventions: Dr. Nelson Chimes ordered 12 lead EKG, Chest x-ray, and troponin.    Plan of Care: obtain tests    Event Summary: patient to remain in room 158  MD Notified: 10:02 Call Time:10:02 Arrival Time:10:04 End Time:10:20  Kelby Fam, RN

## 2023-03-07 NOTE — Progress Notes (Signed)
PROGRESS NOTE    Gary [name]  [id number]:[id number] DOB: 04-28-1955 DOA: 11/28/2022 PCP: Shayne Alken, MD     Brief Narrative:  Gary [name] is a 67 y.o. 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  Patient initially ran out of torsemide and was found to be in CHF exacerbation.  Eventually became euvolemic and transition to p.o. medications.  Patient has been on trach collar status post course of antibiotics for Pseudomonas tracheitis.  Overall stable on 2 L nasal cannula.  Currently awaiting safe disposition Difficult to place due to tracheostomy   Assessment & Plan:   Principal Problem:   Acute on chronic diastolic CHF (congestive heart failure) ([name]) Active Problems:   Acute on chronic respiratory failure with hypoxia and hypercapnia ([name])   COPD (chronic obstructive pulmonary disease) ([name])   Acute kidney injury superimposed on CKD ([name])   OSA (obstructive sleep apnea)   Anxiety   Hypotension   Chest pain   Morbid obesity with BMI of 50.0-59.9, adult ([name])   Generalized weakness   Chronic colitis   Fluid overload   Eyelid cyst, right   Dry skin   Tracheitis   Acute on chronic diastolic CHF (congestive heart failure) ([name]) Initially admitted for volume overloaded.  Now appears to be euvolemic.  Currently patient is on Aldactone, torsemide.  Atypical chest pain; resolved.  -EKG and trops are neg.    Acute on chronic respiratory failure with hypoxia and hypercapnia ([name]) Trach Dependent Pseudomonas tracheitis On trach collar. S/p abx course for pseudomonas tracheitis Also evaluated for non-massive hemoptysis with CT soft tissue of neck done on 02/08/2023 .  Tracheostomy tube is in place and there is no definite collection around the tube.  There is thinning of the  wall between the esophagus and trachea with possible defect. Currently stable on 2 L nasal cannula   COPD (chronic obstructive pulmonary disease) ([name]) Severe COPD on Combivent, as needed albuterol.  On yuperli nebulizer.   Acute kidney injury superimposed on CKD ([name]) Creatinine stable around 1.5   Anxiety On Atarax as needed.  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   Chronic colitis Stable Continue home mesalamine   Generalized weakness Patient has been motivated and working with physical therapy and mobility specialist.  Continue ambulating and exercises much as tolerated. Patient is appropriate for discharge to inpatient rehab versus skilled nursing facility   Morbid obesity with BMI of 50.0-59.9, adult ([name]) Complicates above care   Constipation Reported on 11/17 Advised nurse to administer as needed lactulose Successful.  Patient having BM     DVT prophylaxis: Lovenox Code Status: Full Family Communication: None Disposition Plan: Status is: Inpatient Remains inpatient appropriate because: Unsafe discharge plan.  Patient medically appropriate for discharge    Subjective:  Atypical chest pain this morning, SL Nitroglycerine given.  EKG and trops are neg.    Examination:   General exam: NAD; obese.  Respiratory system: Diminished at bases.  Normal work of breathing.  2 L trach mask Cardiovascular system: S1-S2, RRR, no murmurs, trace pedal edema Gastrointestinal system: Obese, soft, NT/ND, normal bowel sounds Central nervous system: Alert and oriented. No focal neurological deficits. Extremities: Decreased power lower extremities.  Gait not assessed Skin: No rashes, lesions  or ulcers Psychiatry: Judgement and insight appear normal. Mood & affect appropriate.              Pressure Injury 09/27/22 Buttocks Left;Medial;Upper Stage 2 -  Partial thickness loss of dermis presenting  as a shallow open injury with a red, pink wound bed without slough. Left buttocks small stage 2 (Active)  09/27/22 1130  Location: Buttocks  Location Orientation: Left;Medial;Upper  Staging: Stage 2 -  Partial thickness loss of dermis presenting as a shallow open injury with a red, pink wound bed without slough.  Wound Description (Comments): Left buttocks small stage 2  Present on Admission: Yes     Diet Orders (From admission, onward)     Start     Ordered   02/27/23 1247  Diet 2 gram sodium Fluid consistency: Thin  Diet effective now       Question:  Fluid consistency:  Answer:  Thin   02/27/23 1246   02/26/23 0000  Diet - low sodium heart healthy        02/26/23 1019            Objective: Vitals:   03/07/23 0025 03/07/23 0500 03/07/23 0814 03/07/23 1003  BP: 107/73  114/73 101/74  Pulse: 81  80 84  Resp: 16  16   Temp: 98 F (36.7 C)  97.8 F (36.6 C)   TempSrc:   Oral   SpO2: 97%  92% 97%  Weight:  (!) 190 kg    Height:        Intake/Output Summary (Last 24 hours) at 03/07/2023 1315 Last data filed at 03/07/2023 1115 Gross per 24 hour  Intake --  Output 2100 ml  Net -2100 ml   Filed Weights   03/03/23 0703 03/06/23 0500 03/07/23 0500  Weight: (!) 185.4 kg (!) 189.3 kg (!) 190 kg    Scheduled Meds:  amphetamine-dextroamphetamine  30 mg Oral Q breakfast   apixaban  2.5 mg Oral BID   bacitracin   Topical Daily   bisacodyl  10 mg Oral Daily   docusate sodium  100 mg Oral BID   escitalopram  10 mg Oral Daily   [START ON 03/08/2023] ferrous sulfate  325 mg Oral Daily   Gerhardt's butt cream   Topical Daily   guaiFENesin  1,200 mg Oral BID   Ipratropium-Albuterol  1 puff Inhalation Q6H   polyethylene glycol  17 g Oral BID   spironolactone  25 mg Oral Daily   torsemide  40 mg Oral BID   Continuous Infusions:  Nutritional status     Body mass index is 50.99 kg/m.  Data Reviewed:   CBC: Recent Labs  Lab 03/06/23 0840  WBC 8.3  NEUTROABS 5.7   HGB 11.2*  HCT 39.9  MCV 75.7*  PLT 224   Basic Metabolic Panel: Recent Labs  Lab 03/06/23 0840  NA 138  K 3.8  CL 101  CO2 31  GLUCOSE 101*  BUN 35*  CREATININE 1.66*  CALCIUM 8.5*   GFR: Estimated Creatinine Clearance: 80.4 mL/min (A) (by C-G formula based on SCr of 1.66 mg/dL (H)). Liver Function Tests: No results for input(s): "AST", "ALT", "ALKPHOS", "BILITOT", "PROT", "ALBUMIN" in the last 168 hours. No results for input(s): "LIPASE", "AMYLASE" in the last 168 hours. No results for input(s): "AMMONIA" in the last 168 hours. Coagulation Profile: No results for input(s): "INR", "PROTIME" in the last 168 hours. Cardiac Enzymes: No results for input(s): "CKTOTAL", "CKMB", "CKMBINDEX", "TROPONINI" in the last 168 hours.  BNP (last 3 results) No results for input(s): "PROBNP" in the last 8760 hours. HbA1C: No results for input(s): "HGBA1C" in the last 72 hours. CBG: Recent Labs  Lab 03/07/23 1001  GLUCAP 129*   Lipid Profile: No results for input(s): "CHOL", "HDL", "LDLCALC", "TRIG", "CHOLHDL", "LDLDIRECT" in the last 72 hours. Thyroid Function Tests: Recent Labs    03/06/23 0834  TSH 1.379   Anemia Panel: No results for input(s): "VITAMINB12", "FOLATE", "FERRITIN", "TIBC", "IRON", "RETICCTPCT" in the last 72 hours. Sepsis Labs: No results for input(s): "PROCALCITON", "LATICACIDVEN" in the last 168 hours.  No results found for this or any previous visit (from the past 240 hour(s)).       Radiology Studies: DG Chest Port 1 View  Result Date: 03/07/2023 CLINICAL DATA:  67 year old male with chest pain. EXAM: PORTABLE CHEST 1 VIEW COMPARISON:  Portable chest 02/17/2023 and earlier. FINDINGS: Portable AP semi upright view at 1031 hours. Chronic tracheostomy is stable. Ongoing low lung volumes. Stable cardiomegaly and mediastinal contours. Stable ventilation. No pneumothorax, consolidation, pleural effusion or pulmonary edema. Stable visualized osseous  structures. IMPRESSION: Stable chest. Low lung volumes, no acute cardiopulmonary abnormality. Electronically Signed   By: Odessa Fleming M.D.   On: 03/07/2023 11:29           LOS: 97 days   Time spent= 35 mins    Miguel Rota, MD [facility]  If 7PM-7AM, please contact night-coverage  03/07/2023, 1:15 PM

## 2023-03-07 NOTE — Progress Notes (Signed)
Per Tele please verify pt's bed alarm is on and active. Pt refused bed alarm.

## 2023-03-08 DIAGNOSIS — E8770 Fluid overload, unspecified: Secondary | ICD-10-CM | POA: Diagnosis not present

## 2023-03-08 DIAGNOSIS — J441 Chronic obstructive pulmonary disease with (acute) exacerbation: Secondary | ICD-10-CM | POA: Diagnosis not present

## 2023-03-08 DIAGNOSIS — I5033 Acute on chronic diastolic (congestive) heart failure: Secondary | ICD-10-CM | POA: Diagnosis not present

## 2023-03-08 DIAGNOSIS — J9601 Acute respiratory failure with hypoxia: Secondary | ICD-10-CM | POA: Diagnosis not present

## 2023-03-08 DIAGNOSIS — J9602 Acute respiratory failure with hypercapnia: Secondary | ICD-10-CM | POA: Diagnosis not present

## 2023-03-08 NOTE — Progress Notes (Signed)
PT Cancellation Note  Patient Details Name: Gary [name] MRN: [id number] DOB: Jun 22, 1955   Cancelled Treatment:    Reason Eval/Treat Not Completed: Patient declined to participate with PT services this date secondary to fatigue.  Will attempt to see pt at a future date/time as medically appropriate.    Ovidio Hanger PT, DPT 03/08/23, 1:48 PM

## 2023-03-08 NOTE — Progress Notes (Signed)
PROGRESS NOTE    Gary [name]  [id number]:[id number] DOB: 12-Mar-1956 DOA: 11/28/2022 PCP: Shayne Alken, MD     Brief Narrative:  Gary [name] is a 67 y.o. 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  Patient initially ran out of torsemide and was found to be in CHF exacerbation.  Eventually became euvolemic and transition to p.o. medications.  Patient has been on trach collar status post course of antibiotics for Pseudomonas tracheitis.  Overall stable on 2 L nasal cannula.  Currently awaiting safe disposition Difficult to place due to tracheostomy   Assessment & Plan:   Principal Problem:   Acute on chronic diastolic CHF (congestive heart failure) ([name]) Active Problems:   Acute on chronic respiratory failure with hypoxia and hypercapnia ([name])   COPD (chronic obstructive pulmonary disease) ([name])   Acute kidney injury superimposed on CKD ([name])   OSA (obstructive sleep apnea)   Anxiety   Hypotension   Chest pain   Morbid obesity with BMI of 50.0-59.9, adult ([name])   Generalized weakness   Chronic colitis   Fluid overload   Eyelid cyst, right   Dry skin   Tracheitis   Acute on chronic diastolic CHF (congestive heart failure) ([name]) Initially admitted for volume overloaded.  Now appears to be euvolemic.  Currently patient is on Aldactone, torsemide.  Atypical chest pain; resolved.  -EKG and trops are neg.    Acute on chronic respiratory failure with hypoxia and hypercapnia ([name]) Trach Dependent Pseudomonas tracheitis On trach collar. S/p abx course for pseudomonas tracheitis Also evaluated for non-massive hemoptysis with CT soft tissue of neck done on 02/08/2023 .  Tracheostomy tube is in place and there is no definite collection around the tube.  There is thinning of the  wall between the esophagus and trachea with possible defect. Currently stable on 2 L nasal cannula   COPD (chronic obstructive pulmonary disease) ([name]) Severe COPD on Combivent, as needed albuterol.  On yuperli nebulizer.   Acute kidney injury superimposed on CKD ([name]) Creatinine stable around 1.5   Anxiety On Atarax as needed.  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   Chronic colitis Stable Continue home mesalamine   Generalized weakness Patient has been motivated and working with physical therapy and mobility specialist.  Continue ambulating and exercises much as tolerated. Patient is appropriate for discharge to inpatient rehab versus skilled nursing facility   Morbid obesity with BMI of 50.0-59.9, adult ([name]) Complicates above care   Constipation Reported on 11/17 Advised nurse to administer as needed lactulose Successful.  Patient having BM     DVT prophylaxis: Lovenox Code Status: Full Family Communication: None Disposition Plan: Status is: Inpatient Remains inpatient appropriate because: Unsafe discharge plan.  Patient medically appropriate for discharge    Subjective:  No complaints.    Examination:   General exam: NAD; obese.  Respiratory system: Diminished at bases.  Normal work of breathing.  2 L trach mask Cardiovascular system: S1-S2, RRR, no murmurs, trace pedal edema Gastrointestinal system: Obese, soft, NT/ND, normal bowel sounds Central nervous system: Alert and oriented. No focal neurological deficits. Extremities: Decreased power lower extremities.  Gait not assessed Skin: No rashes, lesions or ulcers Psychiatry: Judgement and insight appear normal. Mood & affect appropriate.  Pressure Injury 09/27/22 Buttocks Left;Medial;Upper Stage 2 -  Partial thickness loss of dermis presenting as a shallow open injury with a red, pink wound bed without slough.  Left buttocks small stage 2 (Active)  09/27/22 1130  Location: Buttocks  Location Orientation: Left;Medial;Upper  Staging: Stage 2 -  Partial thickness loss of dermis presenting as a shallow open injury with a red, pink wound bed without slough.  Wound Description (Comments): Left buttocks small stage 2  Present on Admission: Yes     Diet Orders (From admission, onward)     Start     Ordered   02/27/23 1247  Diet 2 gram sodium Fluid consistency: Thin  Diet effective now       Question:  Fluid consistency:  Answer:  Thin   02/27/23 1246   02/26/23 0000  Diet - low sodium heart healthy        02/26/23 1019            Objective: Vitals:   03/07/23 1549 03/07/23 2333 03/08/23 0500 03/08/23 0752  BP:  112/73  100/70  Pulse:  80  71  Resp: 20 16  14   Temp:  98.2 F (36.8 C)  98 F (36.7 C)  TempSrc:    Oral  SpO2:  93%  92%  Weight:   (!) 190.3 kg   Height:        Intake/Output Summary (Last 24 hours) at 03/08/2023 1334 Last data filed at 03/08/2023 2952 Gross per 24 hour  Intake --  Output 1550 ml  Net -1550 ml   Filed Weights   03/06/23 0500 03/07/23 0500 03/08/23 0500  Weight: (!) 189.3 kg (!) 190 kg (!) 190.3 kg    Scheduled Meds:  amphetamine-dextroamphetamine  30 mg Oral Q breakfast   apixaban  2.5 mg Oral BID   bacitracin   Topical Daily   bisacodyl  10 mg Oral Daily   docusate sodium  100 mg Oral BID   escitalopram  10 mg Oral Daily   ferrous sulfate  325 mg Oral Daily   Gerhardt's butt cream   Topical Daily   guaiFENesin  1,200 mg Oral BID   Ipratropium-Albuterol  1 puff Inhalation Q6H   polyethylene glycol  17 g Oral BID   spironolactone  25 mg Oral Daily   torsemide  40 mg Oral BID   Continuous Infusions:  Nutritional status     Body mass index is 51.07 kg/m.  Data Reviewed:   CBC: Recent Labs  Lab 03/06/23 0840  WBC 8.3  NEUTROABS 5.7  HGB 11.2*  HCT 39.9  MCV 75.7*  PLT 224   Basic Metabolic Panel: Recent Labs  Lab  03/06/23 0840  NA 138  K 3.8  CL 101  CO2 31  GLUCOSE 101*  BUN 35*  CREATININE 1.66*  CALCIUM 8.5*   GFR: Estimated Creatinine Clearance: 80.4 mL/min (A) (by C-G formula based on SCr of 1.66 mg/dL (H)). Liver Function Tests: No results for input(s): "AST", "ALT", "ALKPHOS", "BILITOT", "PROT", "ALBUMIN" in the last 168 hours. No results for input(s): "LIPASE", "AMYLASE" in the last 168 hours. No results for input(s): "AMMONIA" in the last 168 hours. Coagulation Profile: No results for input(s): "INR", "PROTIME" in the last 168 hours. Cardiac Enzymes: No results for input(s): "CKTOTAL", "CKMB", "CKMBINDEX", "TROPONINI" in the last 168 hours. BNP (last 3 results) No results for input(s): "PROBNP" in the last 8760 hours. HbA1C: No results for input(s): "HGBA1C" in the last 72 hours. CBG: Recent Labs  Lab 03/07/23 1001  GLUCAP 129*   Lipid Profile: No results for input(s): "CHOL", "HDL", "LDLCALC", "TRIG", "CHOLHDL", "LDLDIRECT" in the last 72 hours. Thyroid Function Tests: Recent Labs    03/06/23 0834  TSH 1.379   Anemia Panel: No results for input(s): "VITAMINB12", "FOLATE", "FERRITIN", "TIBC", "IRON", "RETICCTPCT" in the last 72 hours. Sepsis Labs: No results for input(s): "PROCALCITON", "LATICACIDVEN" in the last 168 hours.  No results found for this or any previous visit (from the past 240 hour(s)).       Radiology Studies: DG Chest Port 1 View  Result Date: 03/07/2023 CLINICAL DATA:  67 year old male with chest pain. EXAM: PORTABLE CHEST 1 VIEW COMPARISON:  Portable chest 02/17/2023 and earlier. FINDINGS: Portable AP semi upright view at 1031 hours. Chronic tracheostomy is stable. Ongoing low lung volumes. Stable cardiomegaly and mediastinal contours. Stable ventilation. No pneumothorax, consolidation, pleural effusion or pulmonary edema. Stable visualized osseous structures. IMPRESSION: Stable chest. Low lung volumes, no acute cardiopulmonary abnormality.  Electronically Signed   By: Odessa Fleming M.D.   On: 03/07/2023 11:29           LOS: 98 days   Time spent= 35 mins    Miguel Rota, MD [facility]  If 7PM-7AM, please contact night-coverage  03/08/2023, 1:34 PM

## 2023-03-08 NOTE — Plan of Care (Signed)

## 2023-03-08 NOTE — Progress Notes (Signed)
Occupational Therapy Treatment Patient Details Name: Gary [name] MRN: [id number] DOB: 1955-07-17 Today's Date: 03/08/2023   History of present illness 67 y/o male presented to [facility] ED on 11/28/22 for chest pain x 4 days. Admitted for acute on chronic CHF. Frequent admissions this year with most recent dsicharge on 11/21/22. PMH includes obesity, hypoxia, respiratory failure with tracheostomy, COPD, GIB, HFpEF.   OT comments  Gary [name] was seen for OT treatment on this date. Upon arrival to room pt reclined in bed, agreeable to tx. Pt requires SUPERVISION exit bed, fair sitting balance. Agreeable to trial standing from lower bed height, unable to stand from 29" without assist. CGA + RW sit<>Stand from 30" bed height x2 trials. MOD A return to bed. Pt making progress toward goals, will continue to follow POC. Discharge recommendation remains appropriate.       If plan is discharge home, recommend the following:  A little help with walking and/or transfers;A lot of help with bathing/dressing/bathroom   Equipment Recommendations  BSC/3in1    Recommendations for Other Services      Precautions / Restrictions Precautions Precautions: Fall Restrictions Weight Bearing Restrictions: No       Mobility Bed Mobility Overal bed mobility: Needs Assistance Bed Mobility: Supine to Sit, Sit to Supine     Supine to sit: Supervision Sit to supine: Mod assist        Transfers Overall transfer level: Needs assistance Equipment used: Rolling walker (2 wheels) Transfers: Sit to/from Stand Sit to Stand: Supervision, From elevated surface                 Balance Overall balance assessment: Needs assistance Sitting-balance support: No upper extremity supported, Feet supported Sitting balance-[name] Scale: Normal     Standing balance support: Bilateral upper extremity supported Standing balance-[name] Scale: Fair                             ADL either performed or  assessed with clinical judgement   ADL Overall ADL's : Needs assistance/impaired                                       General ADL Comments: MAX A don B shoes in sitting      Cognition Arousal: Alert Behavior During Therapy: WFL for tasks assessed/performed Overall Cognitive Status: Within Functional Limits for tasks assessed                                                     Pertinent Vitals/ Pain       Pain Assessment Pain Assessment: No/denies pain   Frequency  Min 1X/week        Progress Toward Goals  OT Goals(current goals can now be found in the care plan section)  Progress towards OT goals: Progressing toward goals  Acute Rehab OT Goals Patient Stated Goal: to get stronger OT Goal Formulation: With patient Time For Goal Achievement: 03/30/23 Potential to Achieve Goals: Good ADL Goals Pt Will Perform Grooming: with modified independence;standing Pt Will Transfer to Toilet: with modified independence;ambulating;regular height toilet Pt Will Perform Toileting - Clothing Manipulation and hygiene: with mod assist;sit to/from stand Additional ADL Goal #1: Pt will stand from  the hospital bed or recliner adn LR DME with min A as a precursor to ADLs  Plan Discharge plan remains appropriate;Frequency remains appropriate    Co-evaluation                 AM-PAC OT "6 Clicks" Daily Activity     Outcome Measure   Help from another person eating meals?: None Help from another person taking care of personal grooming?: A Little Help from another person toileting, which includes using toliet, bedpan, or urinal?: A Lot Help from another person bathing (including washing, rinsing, drying)?: A Lot Help from another person to put on and taking off regular upper body clothing?: None Help from another person to put on and taking off regular lower body clothing?: A Lot 6 Click Score: 17    End of Session Equipment Utilized During  Treatment: Rolling walker (2 wheels);Oxygen  OT Visit Diagnosis: Unsteadiness on feet (R26.81);Other abnormalities of gait and mobility (R26.89);Muscle weakness (generalized) (M62.81);Adult, failure to thrive (R62.7)   Activity Tolerance Patient tolerated treatment well   Patient Left in bed;with call bell/phone within reach   Nurse Communication          Time: 1610-9604 OT Time Calculation (min): 23 min  Charges: OT General Charges $OT Visit: 1 Visit OT Treatments $Self Care/Home Management : 8-22 mins $Therapeutic Activity: 8-22 mins  Kathie Dike, M.S. OTR/L  03/08/23, 2:20 PM  ascom [phone]

## 2023-03-08 NOTE — Progress Notes (Signed)
Mobility Specialist - Progress Note  During mobility: HR 84, BP, SpO2 92%   03/08/23 1018  Mobility  Activity Ambulated with assistance in hallway  Level of Assistance Standby assist, set-up cues, supervision of patient - no hands on  Assistive Device Front wheel walker  Distance Ambulated (ft) 30 ft  Activity Response Tolerated well  $Mobility charge 1 Mobility  Mobility Specialist Start Time (ACUTE ONLY) 0915  Mobility Specialist Stop Time (ACUTE ONLY) 1003  Mobility Specialist Time Calculation (min) (ACUTE ONLY) 48 min   Pt supine upon entry, utilizing RA. Pt continues to direct most aspects of care per his preference-- in relation to bed height, walker position upon standing and oxygen titration during amb. Pt completed bed mob, dangled EOB while being placed on 4L [place] prior to amb. Pt STS and amb 30 ft in the hallway with supervision, sat in the wheelchair due to feeling SOB and fatigue-- O2 >90%. Pt educated on breathing techniques, energy conversation and relaxation techniques. Pt STS from wheelchair unsuccessful, self limiting and constantly expressing "I can't do it, this chair is too low" even when wheeled closer to railing for use of BUE to pull up. Pt transferred chair to bed via [facility] lift +2 for safety. Pt left supine with needs within reach.   Zetta Bills Mobility Specialist 03/08/23 10:34 AM
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
## 2023-03-09 DIAGNOSIS — I5033 Acute on chronic diastolic (congestive) heart failure: Secondary | ICD-10-CM | POA: Diagnosis not present

## 2023-03-09 NOTE — Plan of Care (Signed)
  Problem: Education: Goal: Knowledge of General Education information will improve Description Including pain rating scale, medication(s)/side effects and non-pharmacologic comfort measures Outcome: Progressing   Problem: Health Behavior/Discharge Planning: Goal: Ability to manage health-related needs will improve Outcome: Progressing   

## 2023-03-09 NOTE — Plan of Care (Signed)
Problem: Clinical Measurements: Goal: Respiratory complications will improve Outcome: Not Progressing   Problem: Activity: Goal: Risk for activity intolerance will decrease Outcome: Not Progressing   Problem: Coping: Goal: Level of anxiety will decrease Outcome: Not Progressing

## 2023-03-09 NOTE — Progress Notes (Signed)
PROGRESS NOTE    Gary [name]  ZOX:[id number] DOB: 08-22-1955 DOA: 11/28/2022 PCP: Shayne Alken, MD     Brief Narrative:  Gary [name] is a 67 y.o. 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  Patient initially ran out of torsemide and was found to be in CHF exacerbation.  Eventually became euvolemic and transition to p.o. medications.  Patient has been on trach collar status post course of antibiotics for Pseudomonas tracheitis.  Overall stable on 2 L nasal cannula.  Currently awaiting safe disposition Difficult to place due to tracheostomy   Assessment & Plan:   Principal Problem:   Acute on chronic diastolic CHF (congestive heart failure) ([name]) Active Problems:   Acute on chronic respiratory failure with hypoxia and hypercapnia ([name])   COPD (chronic obstructive pulmonary disease) ([name])   Acute kidney injury superimposed on CKD ([name])   OSA (obstructive sleep apnea)   Anxiety   Hypotension   Chest pain   Morbid obesity with BMI of 50.0-59.9, adult ([name])   Generalized weakness   Chronic colitis   Fluid overload   Eyelid cyst, right   Dry skin   Tracheitis   Acute on chronic diastolic CHF (congestive heart failure) ([name]) Initially admitted for volume overloaded.  Now appears to be euvolemic.  Currently patient is on Aldactone, torsemide.  Atypical chest pain; resolved.  -EKG and trops are neg.    Acute on chronic respiratory failure with hypoxia and hypercapnia ([name]) Trach Dependent Pseudomonas tracheitis On trach collar. S/p abx course for pseudomonas tracheitis Also evaluated for non-massive hemoptysis with CT soft tissue of neck done on 02/08/2023 .  Tracheostomy tube is in place and there is no definite collection around the tube.  There is thinning of the  wall between the esophagus and trachea with possible defect. Currently stable on 2 L nasal cannula   COPD (chronic obstructive pulmonary disease) ([name]) Severe COPD on Combivent, as needed albuterol.  On yuperli nebulizer.   Acute kidney injury superimposed on CKD ([name]) Creatinine stable around 1.5   Anxiety On Atarax as needed.  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   Chronic colitis Stable Continue home mesalamine   Generalized weakness Patient has been motivated and working with physical therapy and mobility specialist.  Continue ambulating and exercises much as tolerated. Patient is appropriate for discharge to inpatient rehab versus skilled nursing facility   Morbid obesity with BMI of 50.0-59.9, adult ([name]) Complicates above care   Constipation Reported on 11/17 Advised nurse to administer as needed lactulose Successful.  Patient having BM     DVT prophylaxis: Lovenox Code Status: Full Family Communication: None Disposition Plan: Status is: Inpatient Remains inpatient appropriate because: Unsafe discharge plan.  Patient medically appropriate for discharge    Subjective: No complaints requesting regular diet    Examination:   General exam: NAD; obese.  Respiratory system: Diminished at bases.  Normal work of breathing.  2 L trach mask Cardiovascular system: S1-S2, RRR, no murmurs, trace pedal edema Gastrointestinal system: Obese, soft, NT/ND, normal bowel sounds Central nervous system: Alert and oriented. No focal neurological deficits. Extremities: Decreased power lower extremities.  Gait not assessed Skin: No rashes, lesions or ulcers Psychiatry: Judgement and insight appear normal. Mood &  affect appropriate.              Pressure Injury 09/27/22 Buttocks Left;Medial;Upper Stage 2 -  Partial thickness loss of dermis presenting as a shallow open injury with a red, pink wound  bed without slough. Left buttocks small stage 2 (Active)  09/27/22 1130  Location: Buttocks  Location Orientation: Left;Medial;Upper  Staging: Stage 2 -  Partial thickness loss of dermis presenting as a shallow open injury with a red, pink wound bed without slough.  Wound Description (Comments): Left buttocks small stage 2  Present on Admission: Yes     Diet Orders (From admission, onward)     Start     Ordered   03/09/23 1033  Diet regular Room service appropriate? Yes; Fluid consistency: Thin  Diet effective now       Question Answer Comment  Room service appropriate? Yes   Fluid consistency: Thin      03/09/23 1032   02/26/23 0000  Diet - low sodium heart healthy        02/26/23 1019            Objective: Vitals:   03/08/23 0500 03/08/23 0752 03/08/23 1604 03/09/23 0849  BP:  100/70 115/70 (!) 91/51  Pulse:  71 84 63  Resp:  14 14 16   Temp:  98 F (36.7 C) 98.3 F (36.8 C) 98.1 F (36.7 C)  TempSrc:  Oral  Oral  SpO2:  92% 95% 100%  Weight: (!) 190.3 kg     Height:        Intake/Output Summary (Last 24 hours) at 03/09/2023 1139 Last data filed at 03/09/2023 1031 Gross per 24 hour  Intake 840 ml  Output 600 ml  Net 240 ml   Filed Weights   03/06/23 0500 03/07/23 0500 03/08/23 0500  Weight: (!) 189.3 kg (!) 190 kg (!) 190.3 kg    Scheduled Meds:  amphetamine-dextroamphetamine  30 mg Oral Q breakfast   apixaban  2.5 mg Oral BID   bacitracin   Topical Daily   bisacodyl  10 mg Oral Daily   docusate sodium  100 mg Oral BID   escitalopram  10 mg Oral Daily   ferrous sulfate  325 mg Oral Daily   Gerhardt's butt cream   Topical Daily   guaiFENesin  1,200 mg Oral BID   Ipratropium-Albuterol  1 puff Inhalation Q6H   polyethylene glycol  17 g Oral BID   spironolactone  25 mg Oral Daily   torsemide  40 mg Oral BID   Continuous Infusions:  Nutritional status     Body mass index is 51.07 kg/m.  Data Reviewed:   CBC: Recent Labs  Lab  03/06/23 0840  WBC 8.3  NEUTROABS 5.7  HGB 11.2*  HCT 39.9  MCV 75.7*  PLT 224   Basic Metabolic Panel: Recent Labs  Lab 03/06/23 0840  NA 138  K 3.8  CL 101  CO2 31  GLUCOSE 101*  BUN 35*  CREATININE 1.66*  CALCIUM 8.5*   GFR: Estimated Creatinine Clearance: 80.4 mL/min (A) (by C-G formula based on SCr of 1.66 mg/dL (H)). Liver Function Tests: No results for input(s): "AST", "ALT", "ALKPHOS", "BILITOT", "PROT", "ALBUMIN" in the last 168 hours. No results for input(s): "LIPASE", "AMYLASE" in the last 168 hours. No results for input(s): "AMMONIA" in the last 168 hours. Coagulation Profile: No results for input(s): "INR", "PROTIME" in the last 168 hours. Cardiac Enzymes: No results for input(s): "CKTOTAL", "CKMB", "CKMBINDEX", "TROPONINI" in the last  168 hours. BNP (last 3 results) No results for input(s): "PROBNP" in the last 8760 hours. HbA1C: No results for input(s): "HGBA1C" in the last 72 hours. CBG: Recent Labs  Lab 03/07/23 1001  GLUCAP 129*   Lipid Profile: No results for input(s): "CHOL", "HDL", "LDLCALC", "TRIG", "CHOLHDL", "LDLDIRECT" in the last 72 hours. Thyroid Function Tests: No results for input(s): "TSH", "T4TOTAL", "FREET4", "T3FREE", "THYROIDAB" in the last 72 hours. Anemia Panel: No results for input(s): "VITAMINB12", "FOLATE", "FERRITIN", "TIBC", "IRON", "RETICCTPCT" in the last 72 hours. Sepsis Labs: No results for input(s): "PROCALCITON", "LATICACIDVEN" in the last 168 hours.  No results found for this or any previous visit (from the past 240 hour(s)).       Radiology Studies: No results found.         LOS: 99 days   Time spent= 35 mins    Miguel Rota, MD [facility]  If 7PM-7AM, please contact night-coverage  03/09/2023, 11:39 AM

## 2023-03-10 DIAGNOSIS — I5033 Acute on chronic diastolic (congestive) heart failure: Secondary | ICD-10-CM | POA: Diagnosis not present

## 2023-03-10 NOTE — Plan of Care (Signed)

## 2023-03-10 NOTE — Progress Notes (Signed)
PROGRESS NOTE    [name]  FAO:[id number] DOB: April 28, 1955 DOA: 11/28/2022 PCP: Shayne Alken, MD     Brief Narrative:  Gary [name] is a 67 y.o. 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  Patient initially ran out of torsemide and was found to be in CHF exacerbation.  Eventually became euvolemic and transition to p.o. medications.  Patient has been on trach collar status post course of antibiotics for Pseudomonas tracheitis.  Overall stable on 2 L nasal cannula.  Currently awaiting safe disposition Difficult to place due to tracheostomy   Assessment & Plan:   Principal Problem:   Acute on chronic diastolic CHF (congestive heart failure) ([name]) Active Problems:   Acute on chronic respiratory failure with hypoxia and hypercapnia ([name])   COPD (chronic obstructive pulmonary disease) ([name])   Acute kidney injury superimposed on CKD ([name])   OSA (obstructive sleep apnea)   Anxiety   Hypotension   Chest pain   Morbid obesity with BMI of 50.0-59.9, adult ([name])   Generalized weakness   Chronic colitis   Fluid overload   Eyelid cyst, right   Dry skin   Tracheitis   Acute on chronic diastolic CHF (congestive heart failure) ([name]) Initially admitted for volume overloaded.  Now appears to be euvolemic.  Currently patient is on Aldactone, torsemide.  Atypical chest pain; resolved.  -EKG and trops are neg.    Acute on chronic respiratory failure with hypoxia and hypercapnia ([name]) Trach Dependent Pseudomonas tracheitis On trach collar. S/p abx course for pseudomonas tracheitis Also evaluated for non-massive hemoptysis with CT soft tissue of neck done on 02/08/2023 .  Tracheostomy tube is in place and there is no definite collection around the tube.  There is thinning of the  wall between the esophagus and trachea with possible defect. Currently stable on 2 L nasal cannula   COPD (chronic obstructive pulmonary disease) ([name]) Severe COPD on Combivent, as needed albuterol.  On yuperli nebulizer.   Acute kidney injury superimposed on CKD ([name]) Creatinine stable around 1.5   Anxiety On Atarax as needed.  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   Chronic colitis Stable Continue home mesalamine   Generalized weakness Patient has been motivated and working with physical therapy and mobility specialist.  Continue ambulating and exercises much as tolerated. Patient is appropriate for discharge to inpatient rehab versus skilled nursing facility   Morbid obesity with BMI of 50.0-59.9, adult ([name]) Complicates above care   Constipation Reported on 11/17 Advised nurse to administer as needed lactulose Successful.  Patient having BM     DVT prophylaxis: Lovenox Code Status: Full Family Communication: None Disposition Plan: Status is: Inpatient Remains inpatient appropriate because: Unsafe discharge plan.  Patient medically appropriate for discharge    Subjective: Doing okay no complaints  Examination:   General exam: NAD; obese.  Respiratory system: Diminished at bases.  Normal work of breathing.  2 L trach mask Cardiovascular system: S1-S2, RRR, no murmurs, trace pedal edema Gastrointestinal system: Obese, soft, NT/ND, normal bowel sounds Central nervous system: Alert and oriented. No focal neurological deficits. Extremities: Decreased power lower extremities.  Gait not assessed Skin: No rashes, lesions or ulcers Psychiatry: Judgement and insight appear normal. Mood & affect appropriate.  Pressure Injury 09/27/22 Buttocks Left;Medial;Upper Stage 2 -  Partial thickness loss of dermis presenting as a shallow open injury with a red, pink wound bed without  slough. Left buttocks small stage 2 (Active)  09/27/22 1130  Location: Buttocks  Location Orientation: Left;Medial;Upper  Staging: Stage 2 -  Partial thickness loss of dermis presenting as a shallow open injury with a red, pink wound bed without slough.  Wound Description (Comments): Left buttocks small stage 2  Present on Admission: Yes     Diet Orders (From admission, onward)     Start     Ordered   03/09/23 1033  Diet regular Room service appropriate? Yes; Fluid consistency: Thin  Diet effective now       Question Answer Comment  Room service appropriate? Yes   Fluid consistency: Thin      03/09/23 1032   02/26/23 0000  Diet - low sodium heart healthy        02/26/23 1019            Objective: Vitals:   03/09/23 0849 03/09/23 1623 03/09/23 2003 03/09/23 2326  BP: (!) 91/51 114/73 102/69   Pulse: 63 87 87 84  Resp: 16 19    Temp: 98.1 F (36.7 C) 98.1 F (36.7 C) 98.5 F (36.9 C)   TempSrc: Oral Oral Oral   SpO2: 100% 95% 96% 95%  Weight:      Height:        Intake/Output Summary (Last 24 hours) at 03/10/2023 1212 Last data filed at 03/10/2023 0900 Gross per 24 hour  Intake 240 ml  Output 1100 ml  Net -860 ml   Filed Weights   03/06/23 0500 03/07/23 0500 03/08/23 0500  Weight: (!) 189.3 kg (!) 190 kg (!) 190.3 kg    Scheduled Meds:  amphetamine-dextroamphetamine  30 mg Oral Q breakfast   apixaban  2.5 mg Oral BID   bacitracin   Topical Daily   bisacodyl  10 mg Oral Daily   docusate sodium  100 mg Oral BID   escitalopram  10 mg Oral Daily   ferrous sulfate  325 mg Oral Daily   Gerhardt's butt cream   Topical Daily   guaiFENesin  1,200 mg Oral BID   Ipratropium-Albuterol  1 puff Inhalation Q6H   polyethylene glycol  17 g Oral BID   spironolactone  25 mg Oral Daily   torsemide  40 mg Oral BID   Continuous Infusions:  Nutritional status     Body mass index is 51.07 kg/m.  Data Reviewed:   CBC: Recent Labs  Lab 03/06/23 0840  WBC 8.3   NEUTROABS 5.7  HGB 11.2*  HCT 39.9  MCV 75.7*  PLT 224   Basic Metabolic Panel: Recent Labs  Lab 03/06/23 0840  NA 138  K 3.8  CL 101  CO2 31  GLUCOSE 101*  BUN 35*  CREATININE 1.66*  CALCIUM 8.5*   GFR: Estimated Creatinine Clearance: 80.4 mL/min (A) (by C-G formula based on SCr of 1.66 mg/dL (H)). Liver Function Tests: No results for input(s): "AST", "ALT", "ALKPHOS", "BILITOT", "PROT", "ALBUMIN" in the last 168 hours. No results for input(s): "LIPASE", "AMYLASE" in the last 168 hours. No results for input(s): "AMMONIA" in the last 168 hours. Coagulation Profile: No results for input(s): "INR", "PROTIME" in the last 168 hours. Cardiac Enzymes: No results for input(s): "CKTOTAL", "CKMB", "CKMBINDEX", "TROPONINI" in the last 168 hours. BNP (last 3 results) No results for input(s): "PROBNP" in the last 8760 hours. HbA1C:  No results for input(s): "HGBA1C" in the last 72 hours. CBG: Recent Labs  Lab 03/07/23 1001  GLUCAP 129*   Lipid Profile: No results for input(s): "CHOL", "HDL", "LDLCALC", "TRIG", "CHOLHDL", "LDLDIRECT" in the last 72 hours. Thyroid Function Tests: No results for input(s): "TSH", "T4TOTAL", "FREET4", "T3FREE", "THYROIDAB" in the last 72 hours. Anemia Panel: No results for input(s): "VITAMINB12", "FOLATE", "FERRITIN", "TIBC", "IRON", "RETICCTPCT" in the last 72 hours. Sepsis Labs: No results for input(s): "PROCALCITON", "LATICACIDVEN" in the last 168 hours.  No results found for this or any previous visit (from the past 240 hour(s)).       Radiology Studies: No results found.         LOS: 100 days   Time spent= 35 mins    Miguel Rota, MD [facility]  If 7PM-7AM, please contact night-coverage  03/10/2023, 12:12 PM

## 2023-03-11 DIAGNOSIS — J441 Chronic obstructive pulmonary disease with (acute) exacerbation: Secondary | ICD-10-CM | POA: Diagnosis not present

## 2023-03-11 DIAGNOSIS — I5033 Acute on chronic diastolic (congestive) heart failure: Secondary | ICD-10-CM | POA: Diagnosis not present

## 2023-03-11 DIAGNOSIS — J9601 Acute respiratory failure with hypoxia: Secondary | ICD-10-CM | POA: Diagnosis not present

## 2023-03-11 DIAGNOSIS — J9602 Acute respiratory failure with hypercapnia: Secondary | ICD-10-CM | POA: Diagnosis not present

## 2023-03-11 DIAGNOSIS — E8770 Fluid overload, unspecified: Secondary | ICD-10-CM | POA: Diagnosis not present

## 2023-03-11 LAB — BASIC METABOLIC PANEL
Anion gap: 9 (ref 5–15)
BUN: 38 mg/dL — ABNORMAL HIGH (ref 8–23)
CO2: 30 mmol/L (ref 22–32)
Calcium: 8.2 mg/dL — ABNORMAL LOW (ref 8.9–10.3)
Chloride: 98 mmol/L (ref 98–111)
Creatinine, Ser: 1.72 mg/dL — ABNORMAL HIGH (ref 0.61–1.24)
GFR, Estimated: 44 mL/min — ABNORMAL LOW (ref >60)
Glucose, Bld: 109 mg/dL — ABNORMAL HIGH (ref 70–99)
Potassium: 4 mmol/L (ref 3.5–5.1)
Sodium: 137 mmol/L (ref 135–145)

## 2023-03-11 LAB — CBC
HCT: 39.1 % (ref 39.0–52.0)
Hemoglobin: 10.7 g/dL — ABNORMAL LOW (ref 13.0–17.0)
MCH: 20.9 pg — ABNORMAL LOW (ref 26.0–34.0)
MCHC: 27.4 g/dL — ABNORMAL LOW (ref 30.0–36.0)
MCV: 76.2 fL — ABNORMAL LOW (ref 80.0–100.0)
Platelets: 231 10*3/uL (ref 150–400)
RBC: 5.13 MIL/uL (ref 4.22–5.81)
RDW: 18.2 % — ABNORMAL HIGH (ref 11.5–15.5)
WBC: 6.7 10*3/uL (ref 4.0–10.5)
nRBC: 0 % (ref 0.0–0.2)

## 2023-03-11 LAB — MAGNESIUM: Magnesium: 2.5 mg/dL — ABNORMAL HIGH (ref 1.7–2.4)

## 2023-03-11 NOTE — Plan of Care (Signed)
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
## 2023-03-11 NOTE — Plan of Care (Signed)

## 2023-03-11 NOTE — Progress Notes (Signed)
PT Cancellation Note  Patient Details Name: KENTRE OVERMILLER MRN: [id number] DOB: 1956-01-26   Cancelled Treatment:    Reason Eval/Treat Not Completed: Patient declined to participate with PT services this date secondary to "It's too late" in the afternoon to get out of bed.  Offered bed level exercises with pt continuing to refuse.  Will attempt to see pt at a future date/time as medically appropriate.    Ovidio Hanger PT, DPT 03/11/23, 1:57 PM

## 2023-03-11 NOTE — Progress Notes (Signed)
PROGRESS NOTE    Gary [name]  YNW:[id number] DOB: 04/07/1956 DOA: 11/28/2022 PCP: Shayne Alken, MD     Brief Narrative:  Gary [name] is a 67 y.o. 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  Patient initially ran out of torsemide and was found to be in CHF exacerbation.  Eventually became euvolemic and transition to p.o. medications.  Patient has been on trach collar status post course of antibiotics for Pseudomonas tracheitis.  Overall stable on 2 L nasal cannula.  Currently awaiting safe disposition Difficult to place due to tracheostomy   Assessment & Plan:   Principal Problem:   Acute on chronic diastolic CHF (congestive heart failure) ([name]) Active Problems:   Acute on chronic respiratory failure with hypoxia and hypercapnia ([name])   COPD (chronic obstructive pulmonary disease) ([name])   Acute kidney injury superimposed on CKD ([name])   OSA (obstructive sleep apnea)   Anxiety   Hypotension   Chest pain   Morbid obesity with BMI of 50.0-59.9, adult ([name])   Generalized weakness   Chronic colitis   Fluid overload   Eyelid cyst, right   Dry skin   Tracheitis   Acute on chronic diastolic CHF (congestive heart failure) ([name]) Initially admitted for volume overloaded.  Now appears to be euvolemic.  Currently patient is on Aldactone, torsemide.  Atypical chest pain; resolved.  -EKG and trops are neg.    Acute on chronic respiratory failure with hypoxia and hypercapnia ([name]) Trach Dependent Pseudomonas tracheitis On trach collar. S/p abx course for pseudomonas tracheitis Also evaluated for non-massive hemoptysis with CT soft tissue of neck done on 02/08/2023 .  Tracheostomy tube is in place and there is no definite collection around the tube.  There is thinning of the  wall between the esophagus and trachea with possible defect. Currently stable on 2 L nasal cannula   COPD (chronic obstructive pulmonary disease) ([name]) Severe COPD on Combivent, as needed albuterol.  On yuperli nebulizer.   Acute kidney injury superimposed on CKD ([name]) Creatinine stable around 1.5   Anxiety On Atarax as needed.  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   Chronic colitis Stable Continue home mesalamine   Generalized weakness Patient has been motivated and working with physical therapy and mobility specialist.  Continue ambulating and exercises much as tolerated. Patient is appropriate for discharge to inpatient rehab versus skilled nursing facility   Morbid obesity with BMI of 50.0-59.9, adult ([name]) Complicates above care   Constipation Reported on 11/17 Advised nurse to administer as needed lactulose Successful.  Patient having BM     DVT prophylaxis: Lovenox Code Status: Full Family Communication: None Disposition Plan: Status is: Inpatient Remains inpatient appropriate because: Unsafe discharge plan.  Patient medically appropriate for discharge    Subjective: No complaints.   Examination:   General exam: NAD; obese.  Respiratory system: Diminished at bases.  Normal work of breathing.  2 L trach mask Cardiovascular system: S1-S2, RRR, no murmurs, trace pedal edema Gastrointestinal system: Obese, soft, NT/ND, normal bowel sounds Central nervous system: Alert and oriented. No focal neurological deficits. Extremities: Decreased power lower extremities.  Gait not assessed Skin: No rashes, lesions or ulcers Psychiatry: Judgement and insight appear normal. Mood & affect appropriate.  Pressure Injury 09/27/22 Buttocks Left;Medial;Upper Stage 2 -  Partial thickness loss of dermis presenting as a shallow open injury with a red, pink wound bed without slough. Left buttocks  small stage 2 (Active)  09/27/22 1130  Location: Buttocks  Location Orientation: Left;Medial;Upper  Staging: Stage 2 -  Partial thickness loss of dermis presenting as a shallow open injury with a red, pink wound bed without slough.  Wound Description (Comments): Left buttocks small stage 2  Present on Admission: Yes     Diet Orders (From admission, onward)     Start     Ordered   03/09/23 1033  Diet regular Room service appropriate? Yes; Fluid consistency: Thin  Diet effective now       Question Answer Comment  Room service appropriate? Yes   Fluid consistency: Thin      03/09/23 1032   02/26/23 0000  Diet - low sodium heart healthy        02/26/23 1019            Objective: Vitals:   03/11/23 0500 03/11/23 0609 03/11/23 0740 03/11/23 1315  BP:   99/69 117/75  Pulse:   79 88  Resp:   14 16  Temp:   98.6 F (37 C) 98.5 F (36.9 C)  TempSrc:   Oral Oral  SpO2:  93% 97% 96%  Weight: (!) 190.9 kg     Height:        Intake/Output Summary (Last 24 hours) at 03/11/2023 1332 Last data filed at 03/11/2023 1100 Gross per 24 hour  Intake 480 ml  Output 3350 ml  Net -2870 ml   Filed Weights   03/07/23 0500 03/08/23 0500 03/11/23 0500  Weight: (!) 190 kg (!) 190.3 kg (!) 190.9 kg    Scheduled Meds:  amphetamine-dextroamphetamine  30 mg Oral Q breakfast   apixaban  2.5 mg Oral BID   bacitracin   Topical Daily   bisacodyl  10 mg Oral Daily   docusate sodium  100 mg Oral BID   escitalopram  10 mg Oral Daily   ferrous sulfate  325 mg Oral Daily   Gerhardt's butt cream   Topical Daily   guaiFENesin  1,200 mg Oral BID   Ipratropium-Albuterol  1 puff Inhalation Q6H   polyethylene glycol  17 g Oral BID   spironolactone  25 mg Oral Daily   torsemide  40 mg Oral BID   Continuous Infusions:  Nutritional status     Body mass index is 51.23 kg/m.  Data Reviewed:   CBC: Recent Labs  Lab 03/06/23 0840 03/11/23 0003  WBC 8.3 6.7  NEUTROABS 5.7  --   HGB 11.2*  10.7*  HCT 39.9 39.1  MCV 75.7* 76.2*  PLT 224 231   Basic Metabolic Panel: Recent Labs  Lab 03/06/23 0840 03/11/23 0003  NA 138 137  K 3.8 4.0  CL 101 98  CO2 31 30  GLUCOSE 101* 109*  BUN 35* 38*  CREATININE 1.66* 1.72*  CALCIUM 8.5* 8.2*  MG  --  2.5*   GFR: Estimated Creatinine Clearance: 77.8 mL/min (A) (by C-G formula based on SCr of 1.72 mg/dL (H)). Liver Function Tests: No results for input(s): "AST", "ALT", "ALKPHOS", "BILITOT", "PROT", "ALBUMIN" in the last 168 hours. No results for input(s): "LIPASE", "AMYLASE" in the last 168 hours. No results for input(s): "AMMONIA" in the last 168 hours. Coagulation Profile: No results for input(s): "INR", "PROTIME" in the last 168 hours. Cardiac Enzymes: No results for input(s): "  CKTOTAL", "CKMB", "CKMBINDEX", "TROPONINI" in the last 168 hours. BNP (last 3 results) No results for input(s): "PROBNP" in the last 8760 hours. HbA1C: No results for input(s): "HGBA1C" in the last 72 hours. CBG: Recent Labs  Lab 03/07/23 1001  GLUCAP 129*   Lipid Profile: No results for input(s): "CHOL", "HDL", "LDLCALC", "TRIG", "CHOLHDL", "LDLDIRECT" in the last 72 hours. Thyroid Function Tests: No results for input(s): "TSH", "T4TOTAL", "FREET4", "T3FREE", "THYROIDAB" in the last 72 hours. Anemia Panel: No results for input(s): "VITAMINB12", "FOLATE", "FERRITIN", "TIBC", "IRON", "RETICCTPCT" in the last 72 hours. Sepsis Labs: No results for input(s): "PROCALCITON", "LATICACIDVEN" in the last 168 hours.  No results found for this or any previous visit (from the past 240 hour(s)).       Radiology Studies: No results found.         LOS: 101 days   Time spent= 35 mins    Miguel Rota, MD [facility]  If 7PM-7AM, please contact night-coverage  03/11/2023, 1:32 PM

## 2023-03-12 DIAGNOSIS — I5033 Acute on chronic diastolic (congestive) heart failure: Secondary | ICD-10-CM | POA: Diagnosis not present

## 2023-03-12 MED ORDER — AMPHETAMINE-DEXTROAMPHETAMINE 10 MG PO TABS
30.0000 mg | ORAL_TABLET | Freq: Every day | ORAL | Status: DC
  Administered 2023-03-13: 30 mg via ORAL
  Filled 2023-03-12: qty 3

## 2023-03-12 NOTE — Progress Notes (Signed)
Occupational Therapy Treatment Patient Details Name: Gary [name] MRN: [id number] DOB: Jun 01, 1955 Today's Date: 03/12/2023   History of present illness 67 y/o male presented to [facility] ED on 11/28/22 for chest pain x 4 days. Admitted for acute on chronic CHF. Frequent admissions this year with most recent dsicharge on 11/21/22. PMH includes obesity, hypoxia, respiratory failure with tracheostomy, COPD, GIB, HFpEF.   OT comments  Gary [name] was seen for OT treatment on this date. Upon arrival to room pt in bed, agreeable to tx. Pt requires SUPERVISION for bed mobility and standing from max elevated bed height. Pt tolerates limited distance this date citing L knee pain; SUPERVISION + RW for ADL t/f ~65 ft. Reviewed seated HEP. Pt making progress toward goals, will continue to follow POC. Discharge recommendation remains appropriate.        If plan is discharge home, recommend the following:  A little help with walking and/or transfers;A lot of help with bathing/dressing/bathroom   Equipment Recommendations  BSC/3in1    Recommendations for Other Services      Precautions / Restrictions Precautions Precautions: Fall Restrictions Weight Bearing Restrictions: No       Mobility Bed Mobility Overal bed mobility: Needs Assistance Bed Mobility: Supine to Sit     Supine to sit: Supervision          Transfers Overall transfer level: Needs assistance Equipment used: Rolling walker (2 wheels) Transfers: Sit to/from Stand Sit to Stand: Supervision, From elevated surface                 Balance Overall balance assessment: Needs assistance Sitting-balance support: No upper extremity supported, Feet supported Sitting balance-[name] Scale: Normal     Standing balance support: Bilateral upper extremity supported Standing balance-[name] Scale: Fair                             ADL either performed or assessed with clinical judgement   ADL Overall ADL's : Needs  assistance/impaired                                       General ADL Comments: MAX A don B shoes in sitting      Cognition Arousal: Alert Behavior During Therapy: WFL for tasks assessed/performed Overall Cognitive Status: Within Functional Limits for tasks assessed                                                     Pertinent Vitals/ Pain       Pain Assessment Pain Assessment: Faces Faces Pain Scale: Hurts little more Pain Location: L knee Pain Descriptors / Indicators: Sore Pain Intervention(s): Limited activity within patient's tolerance, Repositioned   Frequency  Min 1X/week        Progress Toward Goals  OT Goals(current goals can now be found in the care plan section)  Progress towards OT goals: Progressing toward goals  Acute Rehab OT Goals OT Goal Formulation: With patient Time For Goal Achievement: 03/30/23 Potential to Achieve Goals: Good ADL Goals Pt Will Perform Grooming: with modified independence;standing Pt Will Transfer to Toilet: with modified independence;ambulating;regular height toilet Pt Will Perform Toileting - Clothing Manipulation and hygiene: with mod assist;sit to/from stand Additional ADL  Goal #1: Pt will stand from the hospital bed or recliner adn LR DME with min A as a precursor to ADLs  Plan Discharge plan remains appropriate;Frequency remains appropriate    Co-evaluation                 AM-PAC OT "6 Clicks" Daily Activity     Outcome Measure   Help from another person eating meals?: None Help from another person taking care of personal grooming?: A Little Help from another person toileting, which includes using toliet, bedpan, or urinal?: A Lot Help from another person bathing (including washing, rinsing, drying)?: A Lot Help from another person to put on and taking off regular upper body clothing?: None Help from another person to put on and taking off regular lower body clothing?: A  Lot 6 Click Score: 17    End of Session Equipment Utilized During Treatment: Rolling walker (2 wheels);Oxygen  OT Visit Diagnosis: Unsteadiness on feet (R26.81);Other abnormalities of gait and mobility (R26.89);Muscle weakness (generalized) (M62.81);Adult, failure to thrive (R62.7)   Activity Tolerance Patient tolerated treatment well   Patient Left with call bell/phone within reach;in chair;with nursing/sitter in room   Nurse Communication          Time: 3244-0102 OT Time Calculation (min): 28 min  Charges: OT General Charges $OT Visit: 1 Visit OT Treatments $Self Care/Home Management : 8-22 mins $Therapeutic Activity: 8-22 mins  Kathie Dike, M.S. OTR/L  03/12/23, 1:40 PM  ascom [phone]

## 2023-03-12 NOTE — Progress Notes (Signed)
Mobility Specialist - Progress Note   03/12/23 1513  Mobility  Activity Transferred from chair to bed  Level of Assistance +2 (takes two people)  Assistive Device  Water quality scientist lift)  Activity Response Tolerated well  $Mobility charge 1 Mobility  Mobility Specialist Start Time (ACUTE ONLY) 1452  Mobility Specialist Stop Time (ACUTE ONLY) 1507  Mobility Specialist Time Calculation (min) (ACUTE ONLY) 15 min   Pt sitting in the recliner upon entry, utilizing 2L. Pt transferred to bed via hoyer lift +2 for safety. Pt returned supine, placed on bed pan and left with needs within reach.   Zetta Bills Mobility Specialist 03/12/23 3:16 PM

## 2023-03-12 NOTE — Progress Notes (Signed)
PROGRESS NOTE    Gary [name]  MVH:[id number] DOB: [birth year] DOA: 11/28/2022 PCP: Shayne Alken, MD     Brief Narrative:  Gary [name] is a 67 y.o. 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  Patient initially ran out of torsemide and was found to be in CHF exacerbation.  Eventually became euvolemic and transition to p.o. medications.  Patient has been on trach collar status post course of antibiotics for Pseudomonas tracheitis.  Overall stable on 2 L nasal cannula.  Currently awaiting safe disposition. Difficult to place due to tracheostomy   Assessment & Plan:   Principal Problem:   Acute on chronic diastolic CHF (congestive heart failure) ([name]) Active Problems:   Acute on chronic respiratory failure with hypoxia and hypercapnia ([name])   COPD (chronic obstructive pulmonary disease) ([name])   Acute kidney injury superimposed on CKD ([name])   OSA (obstructive sleep apnea)   Anxiety   Hypotension   Chest pain   Morbid obesity with BMI of 50.0-59.9, adult ([name])   Generalized weakness   Chronic colitis   Fluid overload   Eyelid cyst, right   Dry skin   Tracheitis   Acute on chronic diastolic CHF (congestive heart failure) ([name]) Initially admitted for volume overloaded.  Now appears to be euvolemic.  Currently patient is on Aldactone, torsemide.  Atypical chest pain; resolved.  -EKG and trops are neg.    Acute on chronic respiratory failure with hypoxia and hypercapnia ([name]) Trach Dependent Pseudomonas tracheitis On trach collar. S/p abx course for pseudomonas tracheitis Also evaluated for non-massive hemoptysis with CT soft tissue of neck done on 02/08/2023 .  Tracheostomy tube is in place and there is no definite collection around the tube.  There is thinning of the  wall between the esophagus and trachea with possible defect. Currently stable on 2 L nasal cannula   COPD (chronic obstructive pulmonary disease) ([name]) Severe COPD on Combivent, as needed albuterol.  On yuperli nebulizer.   Acute kidney injury superimposed on CKD ([name]) Creatinine stable around 1.5   Anxiety On Atarax as needed.  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   Chronic colitis Stable Continue home mesalamine   Generalized weakness Patient has been motivated and working with physical therapy and mobility specialist.  Continue ambulating and exercises much as tolerated. Patient is appropriate for discharge to inpatient rehab versus skilled nursing facility   Morbid obesity with BMI of 50.0-59.9, adult ([name]) Complicates above care   Constipation Reported on 11/17 Advised nurse to administer as needed lactulose Successful.  Patient having BM     DVT prophylaxis: Lovenox Code Status: Full Family Communication: None Disposition Plan: Status is: Inpatient Remains inpatient appropriate because: Unsafe discharge plan.  Patient medically appropriate for discharge.  Currently needs to get stronger with therapy for final disposition.    Subjective: Doing okay no complaints.  Working with therapy  Examination:   General exam: NAD; obese.  Respiratory system: Diminished at bases.  Normal work of breathing.  2 L trach mask Cardiovascular system: S1-S2, RRR, no murmurs, trace pedal edema Gastrointestinal system: Obese, soft, NT/ND, normal bowel sounds Central nervous system: Alert and oriented. No focal neurological deficits. Extremities: Decreased power lower extremities.  Gait not assessed Skin: No  rashes, lesions or ulcers Psychiatry: Judgement and insight appear normal. Mood & affect appropriate.              Pressure Injury 09/27/22 Buttocks Left;Medial;Upper Stage 2 -  Partial  thickness loss of dermis presenting as a shallow open injury with a red, pink wound bed without slough. Left buttocks small stage 2 (Active)  09/27/22 1130  Location: Buttocks  Location Orientation: Left;Medial;Upper  Staging: Stage 2 -  Partial thickness loss of dermis presenting as a shallow open injury with a red, pink wound bed without slough.  Wound Description (Comments): Left buttocks small stage 2  Present on Admission: Yes     Diet Orders (From admission, onward)     Start     Ordered   03/09/23 1033  Diet regular Room service appropriate? Yes; Fluid consistency: Thin  Diet effective now       Question Answer Comment  Room service appropriate? Yes   Fluid consistency: Thin      03/09/23 1032   02/26/23 0000  Diet - low sodium heart healthy        02/26/23 1019            Objective: Vitals:   03/11/23 1546 03/11/23 2330 03/12/23 0500 03/12/23 1009  BP: 112/69 104/60  123/70  Pulse: 87 86  87  Resp: 20 20  17   Temp: 97.8 F (36.6 C) 98.6 F (37 C)    TempSrc:      SpO2: (!) 89% 94%  92%  Weight:   (!) 192.4 kg   Height:        Intake/Output Summary (Last 24 hours) at 03/12/2023 1205 Last data filed at 03/12/2023 8469 Gross per 24 hour  Intake 360 ml  Output 1500 ml  Net -1140 ml   Filed Weights   03/08/23 0500 03/11/23 0500 03/12/23 0500  Weight: (!) 190.3 kg (!) 190.9 kg (!) 192.4 kg    Scheduled Meds:  [START ON 03/13/2023] amphetamine-dextroamphetamine  30 mg Oral QHS   apixaban  2.5 mg Oral BID   bacitracin   Topical Daily   bisacodyl  10 mg Oral Daily   docusate sodium  100 mg Oral BID   escitalopram  10 mg Oral Daily   ferrous sulfate  325 mg Oral Daily   Gerhardt's butt cream   Topical Daily   guaiFENesin  1,200 mg Oral BID   Ipratropium-Albuterol  1 puff Inhalation Q6H   polyethylene glycol  17 g Oral BID   spironolactone  25 mg Oral Daily   torsemide  40 mg Oral BID   Continuous Infusions:  Nutritional status     Body mass  index is 51.63 kg/m.  Data Reviewed:   CBC: Recent Labs  Lab 03/06/23 0840 03/11/23 0003  WBC 8.3 6.7  NEUTROABS 5.7  --   HGB 11.2* 10.7*  HCT 39.9 39.1  MCV 75.7* 76.2*  PLT 224 231   Basic Metabolic Panel: Recent Labs  Lab 03/06/23 0840 03/11/23 0003  NA 138 137  K 3.8 4.0  CL 101 98  CO2 31 30  GLUCOSE 101* 109*  BUN 35* 38*  CREATININE 1.66* 1.72*  CALCIUM 8.5* 8.2*  MG  --  2.5*   GFR: Estimated Creatinine Clearance: 78.1 mL/min (A) (by C-G formula based on SCr of 1.72 mg/dL (H)). Liver Function Tests: No results for input(s): "AST", "ALT", "ALKPHOS", "BILITOT", "PROT", "ALBUMIN" in the last 168 hours. No results for input(s): "LIPASE", "AMYLASE" in the last 168  hours. No results for input(s): "AMMONIA" in the last 168 hours. Coagulation Profile: No results for input(s): "INR", "PROTIME" in the last 168 hours. Cardiac Enzymes: No results for input(s): "CKTOTAL", "CKMB", "CKMBINDEX", "TROPONINI" in the last 168 hours. BNP (last 3 results) No results for input(s): "PROBNP" in the last 8760 hours. HbA1C: No results for input(s): "HGBA1C" in the last 72 hours. CBG: Recent Labs  Lab 03/07/23 1001  GLUCAP 129*   Lipid Profile: No results for input(s): "CHOL", "HDL", "LDLCALC", "TRIG", "CHOLHDL", "LDLDIRECT" in the last 72 hours. Thyroid Function Tests: No results for input(s): "TSH", "T4TOTAL", "FREET4", "T3FREE", "THYROIDAB" in the last 72 hours. Anemia Panel: No results for input(s): "VITAMINB12", "FOLATE", "FERRITIN", "TIBC", "IRON", "RETICCTPCT" in the last 72 hours. Sepsis Labs: No results for input(s): "PROCALCITON", "LATICACIDVEN" in the last 168 hours.  No results found for this or any previous visit (from the past 240 hour(s)).       Radiology Studies: No results found.         LOS: 102 days   Time spent= 35 mins    Miguel Rota, MD [facility]  If 7PM-7AM, please contact night-coverage  03/12/2023, 12:05 PM

## 2023-03-12 NOTE — Plan of Care (Signed)
Problem: Education: Goal: Knowledge of General Education information will improve Description: Including pain rating scale, medication(s)/side effects and non-pharmacologic comfort measures Outcome: Progressing   Problem: Nutrition: Goal: Adequate nutrition will be maintained Outcome: Progressing   Problem: Safety: Goal: Ability to remain free from injury will improve Outcome: Progressing

## 2023-03-13 DIAGNOSIS — I5033 Acute on chronic diastolic (congestive) heart failure: Secondary | ICD-10-CM | POA: Diagnosis not present

## 2023-03-13 NOTE — Progress Notes (Signed)
PT Cancellation Note  Patient Details Name: Gary [name] MRN: [id number] DOB: Mar 28, 1956   Cancelled Treatment:    Reason Eval/Treat Not Completed: Other (comment):  PT declined to participate with PT services on two occasions this afternoon secondary to upset stomach, nursing notified/aware per patient.  Will attempt to see pt at a future date/time as medically appropriate.    Ovidio Hanger PT, DPT 03/13/23, 2:21 PM

## 2023-03-13 NOTE — Progress Notes (Signed)
PROGRESS NOTE    [name]  [name]:[id number] DOB: [birth year] DOA: 11/28/2022 PCP: Shayne Alken, MD    Brief Narrative:   Gary [name] is a 67 y.o. 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  Patient initially ran out of torsemide and was found to be in CHF exacerbation.  Eventually became euvolemic and transition to p.o. medications.  Patient has been on trach collar status post course of antibiotics for Pseudomonas tracheitis.  Overall stable on 2 L nasal cannula.   Currently awaiting safe disposition. Difficult to place due to tracheostomy  Assessment & Plan:   Principal Problem:   Acute on chronic diastolic CHF (congestive heart failure) ([name]) Active Problems:   Acute on chronic respiratory failure with hypoxia and hypercapnia ([name])   COPD (chronic obstructive pulmonary disease) ([name])   Acute kidney injury superimposed on CKD ([name])   OSA (obstructive sleep apnea)   Anxiety   Hypotension   Chest pain   Morbid obesity with BMI of 50.0-59.9, adult ([name])   Generalized weakness   Chronic colitis   Fluid overload   Eyelid cyst, right   Dry skin   Tracheitis  Acute on chronic diastolic CHF (congestive heart failure) ([name]) Initially admitted for volume overloaded.  Now appears to be euvolemic.   Currently patient is on Aldactone, torsemide. Monitor volume status   Atypical chest pain; resolved.  -EKG and trops are neg.    Acute on chronic respiratory failure with hypoxia and hypercapnia ([name]) Trach Dependent Pseudomonas tracheitis On trach collar. S/p abx course for pseudomonas tracheitis Also evaluated for non-massive hemoptysis with CT soft tissue of neck done on 02/08/2023 .  Tracheostomy tube is in place and there is no definite collection around the tube.   There is thinning of the wall between the esophagus and trachea with possible defect. Currently stable on 2 L nasal cannula   COPD (chronic obstructive pulmonary disease) ([name]) Severe COPD on Combivent, as needed albuterol.  On yuperli nebulizer.   Acute kidney injury superimposed on CKD ([name]) Creatinine stable around 1.5   Anxiety On Atarax as needed.  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   Chronic colitis Stable Continue home mesalamine   Generalized weakness Patient has been motivated and working with physical therapy and mobility specialist.  Continue ambulating and exercises much as tolerated. Patient is appropriate for discharge to inpatient rehab versus skilled nursing facility Unfortunately we do not have a disposition plan at this time    Morbid obesity with BMI of 50.0-59.9, adult ([name]) Complicates above care   Constipation Reported on 11/17 Advised nurse to administer as needed lactulose Successful.  Patient having BM    DVT prophylaxis: Lovenox Code Status: Full Family Communication: None Disposition Plan: Status is: Inpatient Remains inpatient appropriate because: Unsafe discharge plan.  Patient medically appropriate for discharge.  Would benefit from short rehab placement  Level of care: Med-Surg  Consultants:  None  Procedures:  None  Antimicrobials: None   Subjective: Seen and examined.  Sleepy this morning.  Dors is abdominal pain.  Objective: Vitals:   03/12/23 1740 03/12/23 1948 03/13/23 0500 03/13/23 0910  BP: 121/84   109/66  Pulse: 84   85  Resp: 17   16  Temp:    97.8 F (36.6 C)  TempSrc:    Oral  SpO2: 91% 91%  93%  Weight:   (!) 192.8 kg   Height:        Intake/Output Summary (Last 24 hours) at 03/13/2023 1455 Last data filed at 03/13/2023 1025 Gross per 24 hour  Intake 240 ml  Output 2050 ml  Net -1810 ml   Filed Weights   03/11/23 0500  03/12/23 0500 03/13/23 0500  Weight: (!) 190.9 kg (!) 192.4 kg (!) 192.8 kg    Examination:  General exam: Appears calm and comfortable  Respiratory system: Diminished to bases.  Normal work of breathing.  2 L trach mask Cardiovascular system: S1-S2 RRR, no murmurs, no pedal edema Gastrointestinal system: Obese, soft, NT/ND, normal bowel sounds Central nervous system: Alert and oriented. No focal neurological deficits. Extremities: Symmetric 5 x 5 power. Skin: No rashes, lesions or ulcers Psychiatry: Judgement and insight appear normal. Mood & affect appropriate.     Data Reviewed: I have personally reviewed following labs and imaging studies  CBC: Recent Labs  Lab 03/11/23 0003  WBC 6.7  HGB 10.7*  HCT 39.1  MCV 76.2*  PLT 231   Basic Metabolic Panel: Recent Labs  Lab 03/11/23 0003  NA 137  K 4.0  CL 98  CO2 30  GLUCOSE 109*  BUN 38*  CREATININE 1.72*  CALCIUM 8.2*  MG 2.5*   GFR: Estimated Creatinine Clearance: 78.2 mL/min (A) (by C-G formula based on SCr of 1.72 mg/dL (H)). Liver Function Tests: No results for input(s): "AST", "ALT", "ALKPHOS", "BILITOT", "PROT", "ALBUMIN" in the last 168 hours. No results for input(s): "LIPASE", "AMYLASE" in the last 168 hours. No results for input(s): "AMMONIA" in the last 168 hours. Coagulation Profile: No results for input(s): "INR", "PROTIME" in the last 168 hours. Cardiac Enzymes: No results for input(s): "CKTOTAL", "CKMB", "CKMBINDEX", "TROPONINI" in the last 168 hours. BNP (last 3 results) No results for input(s): "PROBNP" in the last 8760 hours. HbA1C: No results for input(s): "HGBA1C" in the last 72 hours. CBG: Recent Labs  Lab 03/07/23 1001  GLUCAP 129*   Lipid Profile: No results for input(s): "CHOL", "HDL", "LDLCALC", "TRIG", "CHOLHDL", "LDLDIRECT" in the last 72 hours. Thyroid Function Tests: No results for input(s): "TSH", "T4TOTAL", "FREET4", "T3FREE", "THYROIDAB" in the last 72 hours. Anemia  Panel: No results for input(s): "VITAMINB12", "FOLATE", "FERRITIN", "TIBC", "IRON", "RETICCTPCT" in the last 72 hours. Sepsis Labs: No results for input(s): "PROCALCITON", "LATICACIDVEN" in the last 168 hours.  No results found for this or any previous visit (from the past 240 hour(s)).       Radiology Studies: No results found.      Scheduled Meds:  amphetamine-dextroamphetamine  30 mg Oral QHS   apixaban  2.5 mg Oral BID   bacitracin   Topical Daily   bisacodyl  10 mg Oral Daily   docusate sodium  100 mg Oral BID   escitalopram  10 mg Oral Daily   ferrous sulfate  325 mg Oral Daily   Gerhardt's butt cream   Topical Daily   guaiFENesin  1,200 mg Oral BID   Ipratropium-Albuterol  1 puff Inhalation Q6H   polyethylene glycol  17 g Oral BID   spironolactone  25 mg Oral Daily   torsemide  40 mg Oral BID   Continuous Infusions:   LOS: 103 days     Tresa Moore, MD [facility]   If 7PM-7AM, please contact night-coverage  03/13/2023, 2:55 PM

## 2023-03-13 NOTE — Plan of Care (Signed)
  Problem: Education: Goal: Knowledge of General Education information will improve Description: Including pain rating scale, medication(s)/side effects and non-pharmacologic comfort measures Outcome: Progressing   Problem: Health Behavior/Discharge Planning: Goal: Ability to manage health-related needs will improve Outcome: Progressing   Problem: Clinical Measurements: Goal: Will remain free from infection Outcome: Progressing Goal: Diagnostic test results will improve Outcome: Progressing   

## 2023-03-14 DIAGNOSIS — I5033 Acute on chronic diastolic (congestive) heart failure: Secondary | ICD-10-CM | POA: Diagnosis not present

## 2023-03-14 MED ORDER — AMPHETAMINE-DEXTROAMPHETAMINE 10 MG PO TABS
30.0000 mg | ORAL_TABLET | Freq: Every day | ORAL | Status: DC
  Filled 2023-03-14: qty 3

## 2023-03-14 NOTE — Plan of Care (Signed)
Problem: Clinical Measurements: Goal: Respiratory complications will improve Outcome: Progressing   Problem: Activity: Goal: Risk for activity intolerance will decrease Outcome: Progressing   Problem: Nutrition: Goal: Adequate nutrition will be maintained Outcome: Progressing   Problem: Elimination: Goal: Will not experience complications related to bowel motility Outcome: Progressing Goal: Will not experience complications related to urinary retention Outcome: Progressing   Problem: Pain Managment: Goal: General experience of comfort will improve Outcome: Progressing

## 2023-03-14 NOTE — Progress Notes (Signed)
Mobility Specialist - Progress Note  (4L) During mobility: HR 101, BP, SpO2 91%   03/14/23 1032  Mobility  Activity Ambulated with assistance in hallway  Level of Assistance Standby assist, set-up cues, supervision of patient - no hands on  Assistive Device Front wheel walker  Distance Ambulated (ft) 60 ft  Activity Response Tolerated well  $Mobility charge 1 Mobility  Mobility Specialist Start Time (ACUTE ONLY) [id number]  Mobility Specialist Stop Time (ACUTE ONLY) 0956  Mobility Specialist Time Calculation (min) (ACUTE ONLY) 22 min   Pt supine upon entry, utilizing 2L. Pt agreeable to OOB amb this date. Pt completed bed mob ModI, STS to RW and amb 60 ft in the hallway SBA with chair follow for safety. During amb Pt required cuing for pursed lip breathing, SOB noted throughout activity-- heavy weight bearing through BUE. Pt returned to the room, left in fowler position with needs within reach.  Zetta Bills Mobility Specialist 03/14/23 10:37 AM

## 2023-03-14 NOTE — Progress Notes (Signed)
PROGRESS NOTE    Gary [name]  ZOX:[id number] DOB: 1955-09-27 DOA: 11/28/2022 PCP: Shayne Alken, MD    Brief Narrative:   Gary [name] is a 67 y.o. 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  Patient initially ran out of torsemide and was found to be in CHF exacerbation.  Eventually became euvolemic and transition to p.o. medications.  Patient has been on trach collar status post course of antibiotics for Pseudomonas tracheitis.  Overall stable on 2 L nasal cannula.   Currently awaiting safe disposition. Difficult to place due to tracheostomy  Assessment & Plan:   Principal Problem:   Acute on chronic diastolic CHF (congestive heart failure) ([name]) Active Problems:   Acute on chronic respiratory failure with hypoxia and hypercapnia ([name])   COPD (chronic obstructive pulmonary disease) ([name])   Acute kidney injury superimposed on CKD ([name])   OSA (obstructive sleep apnea)   Anxiety   Hypotension   Chest pain   Morbid obesity with BMI of 50.0-59.9, adult ([name])   Generalized weakness   Chronic colitis   Fluid overload   Eyelid cyst, right   Dry skin   Tracheitis  Acute on chronic diastolic CHF (congestive heart failure) ([name]) Initially admitted for volume overloaded.  Now appears to be euvolemic.   Currently patient is on Aldactone, torsemide. Monitor volume status   Atypical chest pain; resolved.  -EKG and trops are neg.    Acute on chronic respiratory failure with hypoxia and hypercapnia ([name]) Trach Dependent Pseudomonas tracheitis On trach collar. S/p abx course for pseudomonas tracheitis Also evaluated for non-massive hemoptysis with CT soft tissue of neck done on 02/08/2023 .  Tracheostomy tube is in place and there is no definite collection around the tube.   There is thinning of the wall between the esophagus and trachea with possible defect. Currently stable on 2 L nasal cannula   COPD (chronic obstructive pulmonary disease) ([name]) Severe COPD on Combivent, as needed albuterol.  On yuperli nebulizer.   Acute kidney injury superimposed on CKD ([name]) Creatinine stable around 1.5   Anxiety On Atarax as needed.  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   Chronic colitis Stable Continue home mesalamine   Generalized weakness Patient has been motivated and working with physical therapy and mobility specialist.  Continue ambulating and exercises much as tolerated. Patient is appropriate for discharge to inpatient rehab versus skilled nursing facility Unfortunately we do not have a disposition plan at this time    Morbid obesity with BMI of 50.0-59.9, adult ([name]) Complicates above care   Constipation Lactulose as needed    DVT prophylaxis: Lovenox Code Status: Full Family Communication: None Disposition Plan: Status is: Inpatient Remains inpatient appropriate because: Unsafe discharge plan.  Patient medically appropriate for discharge.  Would benefit from short rehab placement  Level of care: Med-Surg  Consultants:  None  Procedures:  None  Antimicrobials: None   Subjective: Seen and examined.  Resting in bed.  Abdominal pain improved.  Tolerating p.o. intake.  Objective: Vitals:   03/13/23 2127 03/13/23 2330 03/14/23 0500 03/14/23 0730  BP: 121/68   105/75  Pulse: 85 85  80  Resp: 18   15  Temp: 97.9 F (36.6 C)   98.5 F (  36.9 C)  TempSrc: Oral     SpO2: 96% 93%  96%  Weight:   (!) 191.5 kg   Height:        Intake/Output Summary (Last 24 hours) at 03/14/2023 1143 Last data filed at 03/14/2023 0732 Gross per 24 hour  Intake 240 ml  Output 1850 ml  Net -1610 ml   Filed Weights   03/12/23 0500 03/13/23 0500 03/14/23 0500  Weight: (!)  192.4 kg (!) 192.8 kg (!) 191.5 kg    Examination:  General exam: No acute distress Respiratory system: Diminished to bases.  Normal work of breathing.  2 L trach mask Cardiovascular system: S1-S2 RRR, no murmurs, no pedal edema Gastrointestinal system: Obese, soft, NT/ND, normal bowel sounds Central nervous system: Alert and oriented. No focal neurological deficits. Extremities: Symmetric 5 x 5 power. Skin: No rashes, lesions or ulcers Psychiatry: Judgement and insight appear normal. Mood & affect appropriate.     Data Reviewed: I have personally reviewed following labs and imaging studies  CBC: Recent Labs  Lab 03/11/23 0003  WBC 6.7  HGB 10.7*  HCT 39.1  MCV 76.2*  PLT 231   Basic Metabolic Panel: Recent Labs  Lab 03/11/23 0003  NA 137  K 4.0  CL 98  CO2 30  GLUCOSE 109*  BUN 38*  CREATININE 1.72*  CALCIUM 8.2*  MG 2.5*   GFR: Estimated Creatinine Clearance: 77.9 mL/min (A) (by C-G formula based on SCr of 1.72 mg/dL (H)). Liver Function Tests: No results for input(s): "AST", "ALT", "ALKPHOS", "BILITOT", "PROT", "ALBUMIN" in the last 168 hours. No results for input(s): "LIPASE", "AMYLASE" in the last 168 hours. No results for input(s): "AMMONIA" in the last 168 hours. Coagulation Profile: No results for input(s): "INR", "PROTIME" in the last 168 hours. Cardiac Enzymes: No results for input(s): "CKTOTAL", "CKMB", "CKMBINDEX", "TROPONINI" in the last 168 hours. BNP (last 3 results) No results for input(s): "PROBNP" in the last 8760 hours. HbA1C: No results for input(s): "HGBA1C" in the last 72 hours. CBG: No results for input(s): "GLUCAP" in the last 168 hours.  Lipid Profile: No results for input(s): "CHOL", "HDL", "LDLCALC", "TRIG", "CHOLHDL", "LDLDIRECT" in the last 72 hours. Thyroid Function Tests: No results for input(s): "TSH", "T4TOTAL", "FREET4", "T3FREE", "THYROIDAB" in the last 72 hours. Anemia Panel: No results for input(s): "VITAMINB12",  "FOLATE", "FERRITIN", "TIBC", "IRON", "RETICCTPCT" in the last 72 hours. Sepsis Labs: No results for input(s): "PROCALCITON", "LATICACIDVEN" in the last 168 hours.  No results found for this or any previous visit (from the past 240 hour(s)).       Radiology Studies: No results found.      Scheduled Meds:  [START ON 03/15/2023] amphetamine-dextroamphetamine  30 mg Oral Q breakfast   apixaban  2.5 mg Oral BID   bacitracin   Topical Daily   bisacodyl  10 mg Oral Daily   docusate sodium  100 mg Oral BID   escitalopram  10 mg Oral Daily   ferrous sulfate  325 mg Oral Daily   Gerhardt's butt cream   Topical Daily   guaiFENesin  1,200 mg Oral BID   Ipratropium-Albuterol  1 puff Inhalation Q6H   polyethylene glycol  17 g Oral BID   spironolactone  25 mg Oral Daily   torsemide  40 mg Oral BID   Continuous Infusions:   LOS: 104 days     Tresa Moore, MD [facility]   If 7PM-7AM, please contact night-coverage  03/14/2023, 11:43 AM

## 2023-03-15 DIAGNOSIS — I5033 Acute on chronic diastolic (congestive) heart failure: Secondary | ICD-10-CM | POA: Diagnosis not present

## 2023-03-15 MED ORDER — ORAL CARE MOUTH RINSE
15.0000 mL | OROMUCOSAL | Status: DC
  Administered 2023-03-16 – 2023-03-22 (×15): 15 mL via OROMUCOSAL

## 2023-03-15 MED ORDER — ORAL CARE MOUTH RINSE: 15.0000 mL | OROMUCOSAL | Status: DC | PRN

## 2023-03-15 NOTE — Progress Notes (Signed)
Physical Therapy Treatment Patient Details Name: Gary [name] MRN: [id number] DOB: January 22, 1956 Today's Date: 03/15/2023   History of Present Illness 66 y/o male presented to [facility] ED on 11/28/22 for chest pain x 4 days. Admitted for acute on chronic CHF. Frequent admissions this year with most recent dsicharge on 11/21/22. PMH includes obesity, hypoxia, respiratory failure with tracheostomy, COPD, GIB, HFpEF.    PT Comments  Pt was pleasant and motivated to participate during the session and put forth good effort throughout. Pt was able to perform bed mobility and transfers with Superv for safety, continuing to self-dictate methods to perform mobility. Pt was able to amb in hall with BRW and CGA, taking multiple standing rest breaks but generally safe without cuing, and steady throughout. Pt's vitals were monitored during session and remained WNL, pt reporting no adverse symptoms, though demonstrating SOB with gait that resolved quickly. Pt was on O2 throughout session, up to 6L with mobility. Pt will benefit from continued PT services upon discharge to safely address deficits listed in patient problem list for decreased caregiver assistance and eventual return to PLOF.       If plan is discharge home, recommend the following: A lot of help with bathing/dressing/bathroom;Assist for transportation;A lot of help with walking and/or transfers;A little help with walking and/or transfers   Can travel by private vehicle     No  Equipment Recommendations  Other (comment) (TBD at next venue of care)    Recommendations for Other Services       Precautions / Restrictions Precautions Precautions: Fall Restrictions Weight Bearing Restrictions: No     Mobility  Bed Mobility Overal bed mobility: Needs Assistance Bed Mobility: Supine to Sit     Supine to sit: Supervision     General bed mobility comments: Good control though completing slowly, self-directing pace and sequencing     Transfers Overall transfer level: Needs assistance Equipment used:  (BRW) Transfers: Sit to/from Stand Sit to Stand: Supervision, From elevated surface           General transfer comment: Pt self selected significantly elevated bed height for transfer despite encouragement to trial lower height - stating he does not want to waste energy on standing and not have enough energy to walk; reported bilat forearm weakness upon performing transfer to chair following gait    Ambulation/Gait Ambulation/Gait assistance: Contact guard assist Gait Distance (Feet): 110 Feet Assistive device:  (BRW) Gait Pattern/deviations: Step-through pattern Gait velocity: decreased     General Gait Details: able to maintain consistent cadence with symmetrical step lengths - capable gait pattern, steady throughout, taking 4 standing rest breaks this session before requesting to sit, initially on 4L O2 before raising to 6L O2 given pt heavy breathing and request   Stairs             Wheelchair Mobility     Tilt Bed    Modified Rankin (Stroke Patients Only)       Balance Overall balance assessment: Needs assistance Sitting-balance support: No upper extremity supported, Feet supported Sitting balance-[name] Scale: Normal     Standing balance support: Bilateral upper extremity supported, During functional activity Standing balance-[name] Scale: Fair Standing balance comment: Reliant on BRW for all standing activity                            Cognition Arousal: Alert Behavior During Therapy: WFL for tasks assessed/performed Overall Cognitive Status: Within Functional Limits for tasks assessed  Exercises      General Comments        Pertinent Vitals/Pain Pain Assessment Pain Assessment: No/denies pain    Home Living                          Prior Function            PT Goals (current goals can  now be found in the care plan section) Acute Rehab PT Goals Patient Stated Goal: Get better so I can get out of here." PT Goal Formulation: With patient Time For Goal Achievement: 03/11/23 Potential to Achieve Goals: Good Progress towards PT goals: Progressing toward goals    Frequency    Min 1X/week      PT Plan      Co-evaluation PT/OT/SLP Co-Evaluation/Treatment: Yes Reason for Co-Treatment: To address functional/ADL transfers PT goals addressed during session: Mobility/safety with mobility;Proper use of DME;Strengthening/ROM;Balance OT goals addressed during session: ADL's and self-care      AM-PAC PT "6 Clicks" Mobility   Outcome Measure  Help needed turning from your back to your side while in a flat bed without using bedrails?: None Help needed moving from lying on your back to sitting on the side of a flat bed without using bedrails?: None Help needed moving to and from a bed to a chair (including a wheelchair)?: A Lot Help needed standing up from a chair using your arms (e.g., wheelchair or bedside chair)?: A Little Help needed to walk in hospital room?: A Little Help needed climbing 3-5 steps with a railing? : Total 6 Click Score: 17    End of Session Equipment Utilized During Treatment: Oxygen (2.5L in room, up to 6L with gait) Activity Tolerance: Patient tolerated treatment well Patient left: in chair;with call bell/phone within reach Nurse Communication: Mobility status PT Visit Diagnosis: Muscle weakness (generalized) (M62.81);Difficulty in walking, not elsewhere classified (R26.2)     Time: 1310-1330 PT Time Calculation (min) (ACUTE ONLY): 20 min  Charges:                            Rosiland Oz SPT 03/15/23, 2:54 PM

## 2023-03-15 NOTE — Progress Notes (Signed)
PROGRESS NOTE    Gary [name]  UVO:[id number] DOB: [birth year] DOA: 11/28/2022 PCP: Shayne Alken, MD    Brief Narrative:   Gary [name] is a 67 y.o. 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  Patient initially ran out of torsemide and was found to be in CHF exacerbation.  Eventually became euvolemic and transition to p.o. medications.  Patient has been on trach collar status post course of antibiotics for Pseudomonas tracheitis.  Overall stable on 2 L nasal cannula.   Currently awaiting safe disposition. Difficult to place due to tracheostomy  Assessment & Plan:   Principal Problem:   Acute on chronic diastolic CHF (congestive heart failure) ([name]) Active Problems:   Acute on chronic respiratory failure with hypoxia and hypercapnia ([name])   COPD (chronic obstructive pulmonary disease) ([name])   Acute kidney injury superimposed on CKD ([name])   OSA (obstructive sleep apnea)   Anxiety   Hypotension   Chest pain   Morbid obesity with BMI of 50.0-59.9, adult ([name])   Generalized weakness   Chronic colitis   Fluid overload   Eyelid cyst, right   Dry skin   Tracheitis  Acute on chronic diastolic CHF (congestive heart failure) ([name]) Initially admitted for volume overloaded.  Now appears to be euvolemic.   Currently patient is on Aldactone, torsemide. Monitor volume status closely   Atypical chest pain; resolved.  -EKG and trops are neg.    Acute on chronic respiratory failure with hypoxia and hypercapnia ([name]) Trach Dependent Pseudomonas tracheitis On trach collar. S/p abx course for pseudomonas tracheitis Also evaluated for non-massive hemoptysis with CT soft tissue of neck done on 02/08/2023 .  Tracheostomy tube is in place and there is no definite collection around  the tube.  There is thinning of the wall between the esophagus and trachea with possible defect. Currently stable on 2 L nasal cannula   COPD (chronic obstructive pulmonary disease) ([name]) Severe COPD on Combivent, as needed albuterol.  On yuperli nebulizer.   Acute kidney injury superimposed on CKD ([name]) Creatinine stable around 1.5   Anxiety On Atarax as needed.  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   Chronic colitis Stable Continue home mesalamine   Generalized weakness Patient has been motivated and working with physical therapy and mobility specialist.  Continue ambulating and exercises much as tolerated. Patient is appropriate for discharge to inpatient rehab versus skilled nursing facility Unfortunately we do not have a disposition plan at this time    Morbid obesity with BMI of 50.0-59.9, adult ([name]) Complicates above care   Constipation Lactulose as needed    DVT prophylaxis: Lovenox Code Status: Full Family Communication: None Disposition Plan: Status is: Inpatient Remains inpatient appropriate because: Unsafe discharge plan.  Patient medically appropriate for discharge.  Would benefit from short rehab placement  Level of care: Med-Surg  Consultants:  None  Procedures:  None  Antimicrobials: None   Subjective: Seen and examined.  Resting in bed.  No visible distress.  No complaints of pain.  Motivated to ambulate.  Objective: Vitals:   03/14/23 0730 03/14/23 1616 03/14/23 2221 03/15/23 0903  BP: 105/75 110/69 111/72 106/71  Pulse: 80 86 81 79  Resp: 15 16 20 17   Temp: 98.5 F (  36.9 C) 98.2 F (36.8 C) 98.4 F (36.9 C) 97.7 F (36.5 C)  TempSrc:  Oral Oral   SpO2: 96% 95% 97% 97%  Weight:      Height:        Intake/Output Summary (Last 24 hours) at 03/15/2023 1205 Last data filed at 03/15/2023 3244 Gross per 24 hour  Intake --  Output 2450 ml  Net -2450 ml    Filed Weights   03/12/23 0500 03/13/23 0500 03/14/23 0500  Weight: (!) 192.4 kg (!) 192.8 kg (!) 191.5 kg    Examination:  General exam: NAD Respiratory system: Diminished to bases.  Normal work of breathing.  2 L trach mask Cardiovascular system: S1-S2 RRR, no murmurs, no pedal edema Gastrointestinal system: Obese, soft, NT/ND, normal bowel sounds Central nervous system: Alert and oriented. No focal neurological deficits. Extremities: Symmetric 5 x 5 power. Skin: No rashes, lesions or ulcers Psychiatry: Judgement and insight appear normal. Mood & affect appropriate.     Data Reviewed: I have personally reviewed following labs and imaging studies  CBC: Recent Labs  Lab 03/11/23 0003  WBC 6.7  HGB 10.7*  HCT 39.1  MCV 76.2*  PLT 231   Basic Metabolic Panel: Recent Labs  Lab 03/11/23 0003  NA 137  K 4.0  CL 98  CO2 30  GLUCOSE 109*  BUN 38*  CREATININE 1.72*  CALCIUM 8.2*  MG 2.5*   GFR: Estimated Creatinine Clearance: 77.9 mL/min (A) (by C-G formula based on SCr of 1.72 mg/dL (H)). Liver Function Tests: No results for input(s): "AST", "ALT", "ALKPHOS", "BILITOT", "PROT", "ALBUMIN" in the last 168 hours. No results for input(s): "LIPASE", "AMYLASE" in the last 168 hours. No results for input(s): "AMMONIA" in the last 168 hours. Coagulation Profile: No results for input(s): "INR", "PROTIME" in the last 168 hours. Cardiac Enzymes: No results for input(s): "CKTOTAL", "CKMB", "CKMBINDEX", "TROPONINI" in the last 168 hours. BNP (last 3 results) No results for input(s): "PROBNP" in the last 8760 hours. HbA1C: No results for input(s): "HGBA1C" in the last 72 hours. CBG: No results for input(s): "GLUCAP" in the last 168 hours.  Lipid Profile: No results for input(s): "CHOL", "HDL", "LDLCALC", "TRIG", "CHOLHDL", "LDLDIRECT" in the last 72 hours. Thyroid Function Tests: No results for input(s): "TSH", "T4TOTAL", "FREET4", "T3FREE", "THYROIDAB" in the last 72  hours. Anemia Panel: No results for input(s): "VITAMINB12", "FOLATE", "FERRITIN", "TIBC", "IRON", "RETICCTPCT" in the last 72 hours. Sepsis Labs: No results for input(s): "PROCALCITON", "LATICACIDVEN" in the last 168 hours.  No results found for this or any previous visit (from the past 240 hour(s)).       Radiology Studies: No results found.      Scheduled Meds:  amphetamine-dextroamphetamine  30 mg Oral Q breakfast   apixaban  2.5 mg Oral BID   bacitracin   Topical Daily   bisacodyl  10 mg Oral Daily   docusate sodium  100 mg Oral BID   escitalopram  10 mg Oral Daily   ferrous sulfate  325 mg Oral Daily   Gerhardt's butt cream   Topical Daily   guaiFENesin  1,200 mg Oral BID   Ipratropium-Albuterol  1 puff Inhalation Q6H   mouth rinse  15 mL Mouth Rinse 4 times per day   polyethylene glycol  17 g Oral BID   spironolactone  25 mg Oral Daily   torsemide  40 mg Oral BID   Continuous Infusions:   LOS: 105 days     Tresa Moore, MD  [facility]   If 7PM-7AM, please contact night-coverage  03/15/2023, 12:05 PM

## 2023-03-15 NOTE — Progress Notes (Signed)
Occupational Therapy Treatment Patient Details Name: Gary [name] MRN: [id number] DOB: 27-Jan-1956 Today's Date: 03/15/2023   History of present illness 67 y/o male presented [facility] ED on 11/28/22 for chest pain x 4 days. Admitted for acute on chronic CHF. Frequent admissions this year with most recent dsicharge on 11/21/22. PMH includes obesity, hypoxia, respiratory failure with tracheostomy, COPD, GIB, HFpEF.   OT comments  Mr Tripplett was seen for OT treatment on this date. Upon arrival to room pt in bed, agreeable to tx. Pt requires SUPERVISION for bed mobility and standing from max elevated bed height. Tolerated ~100 ft mobility for ADL t/f. Reviewed ECS. Pt making progress toward goals, will continue to follow POC. Discharge recommendation remains appropriate.       If plan is discharge home, recommend the following:  A little help with walking and/or transfers;A lot of help with bathing/dressing/bathroom   Equipment Recommendations  BSC/3in1    Recommendations for Other Services      Precautions / Restrictions Precautions Precautions: Fall Restrictions Weight Bearing Restrictions: No       Mobility Bed Mobility Overal bed mobility: Needs Assistance Bed Mobility: Supine to Sit     Supine to sit: Supervision          Transfers Overall transfer level: Needs assistance Equipment used: Rolling walker (2 wheels) Transfers: Sit to/from Stand Sit to Stand: Supervision, From elevated surface           General transfer comment: Pt continues to self select significantly elevated bed height to rise to standing despite encouragement to trial lower height stating he does not want to waste energy on standing and not have enough energy to walk     Balance Overall balance assessment: Needs assistance Sitting-balance support: No upper extremity supported, Feet supported Sitting balance-[name] Scale: Normal     Standing balance support: Bilateral upper extremity  supported Standing balance-[name] Scale: Fair                             ADL either performed or assessed with clinical judgement   ADL Overall ADL's : Needs assistance/impaired                                       General ADL Comments: MAX A don B shoes in sitting      Cognition Arousal: Alert Behavior During Therapy: WFL for tasks assessed/performed Overall Cognitive Status: Within Functional Limits for tasks assessed                                                     Pertinent Vitals/ Pain       Pain Assessment Pain Assessment: No/denies pain   Frequency  Min 1X/week        Progress Toward Goals  OT Goals(current goals can now be found in the care plan section)  Progress towards OT goals: Progressing toward goals  Acute Rehab OT Goals Patient Stated Goal: to go home OT Goal Formulation: With patient Time For Goal Achievement: 03/30/23 Potential to Achieve Goals: Good ADL Goals Pt Will Perform Grooming: with modified independence;standing Pt Will Transfer to Toilet: with modified independence;ambulating;regular height toilet Pt Will Perform Toileting - Clothing Manipulation and  hygiene: with mod assist;sit to/from stand Additional ADL Goal #1: Pt will stand from the hospital bed or recliner adn LR DME with min A as a precursor to ADLs  Plan Discharge plan remains appropriate;Frequency remains appropriate    Co-evaluation    PT/OT/SLP Co-Evaluation/Treatment: Yes Reason for Co-Treatment: To address functional/ADL transfers PT goals addressed during session: Mobility/safety with mobility;Proper use of DME;Strengthening/ROM;Balance OT goals addressed during session: ADL's and self-care      AM-PAC OT "6 Clicks" Daily Activity     Outcome Measure   Help from another person eating meals?: None Help from another person taking care of personal grooming?: A Little Help from another person toileting, which  includes using toliet, bedpan, or urinal?: A Lot Help from another person bathing (including washing, rinsing, drying)?: A Lot Help from another person to put on and taking off regular upper body clothing?: None Help from another person to put on and taking off regular lower body clothing?: A Lot 6 Click Score: 17    End of Session Equipment Utilized During Treatment: Rolling walker (2 wheels);Oxygen  OT Visit Diagnosis: Unsteadiness on feet (R26.81);Other abnormalities of gait and mobility (R26.89);Muscle weakness (generalized) (M62.81);Adult, failure to thrive (R62.7)   Activity Tolerance Patient tolerated treatment well   Patient Left in chair;with call bell/phone within reach   Nurse Communication Mobility status        Time: 4034-7425 OT Time Calculation (min): 29 min  Charges: OT General Charges $OT Visit: 1 Visit OT Treatments $Therapeutic Activity: 8-22 mins   Kathie Dike, M.S. OTR/L  03/15/23, 2:02 PM  ascom [phone]

## 2023-03-15 NOTE — Progress Notes (Signed)
Mobility Specialist - Progress Note    03/15/23 1541  Mobility  Activity Transferred from chair to bed  Assistive Device [facility] Lift  Range of Motion/Exercises Active  RLE Weight Bearing WBAT  Activity Response Tolerated well  Mobility Referral Yes  $Mobility charge 1 Mobility    Gary Hausen Mobility Specialist 03/15/23, 3:53 PM

## 2023-03-15 NOTE — Plan of Care (Signed)

## 2023-03-15 NOTE — Plan of Care (Signed)

## 2023-03-15 NOTE — Progress Notes (Signed)
   03/15/23 1000  Spiritual Encounters  Type of Visit Follow up  Care provided to: Patient  Referral source Chaplain assessment  Reason for visit Routine spiritual support  OnCall Visit No  Spiritual Framework  Presenting Themes Meaning/purpose/sources of inspiration  Patient Stress Factors Not reviewed  Interventions  Spiritual Care Interventions Made Established relationship of care and support;Compassionate presence  Intervention Outcomes  Outcomes Awareness of support  Spiritual Care Plan  Spiritual Care Issues Still Outstanding No further spiritual care needs at this time (see row info)   Spoke briefly with patient during routine rounding. Patient was not very talkative during my visit. They never made eye contact and keep looking at their phone while I was in the room. Patient has been here for along time and I see sign of depression the times I have visit with the patient. Looking for away to connect with patient.

## 2023-03-16 DIAGNOSIS — I5033 Acute on chronic diastolic (congestive) heart failure: Secondary | ICD-10-CM | POA: Diagnosis not present

## 2023-03-16 MED ORDER — AMPHETAMINE-DEXTROAMPHETAMINE 10 MG PO TABS
30.0000 mg | ORAL_TABLET | Freq: Every day | ORAL | Status: DC
  Administered 2023-03-16 – 2023-04-23 (×39): 30 mg via ORAL
  Filled 2023-03-16 (×40): qty 3

## 2023-03-16 NOTE — Progress Notes (Signed)
PROGRESS NOTE    Gary [name]  ZOX:[id number] DOB: [birth year] DOA: 11/28/2022 PCP: Shayne Alken, MD    Brief Narrative:   Gary [name] is a 67 y.o. 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  Patient initially ran out of torsemide and was found to be in CHF exacerbation.  Eventually became euvolemic and transition to p.o. medications.  Patient has been on trach collar status post course of antibiotics for Pseudomonas tracheitis.  Overall stable on 2 L nasal cannula.   Currently awaiting safe disposition. Difficult to place due to tracheostomy  Assessment & Plan:   Principal Problem:   Acute on chronic diastolic CHF (congestive heart failure) ([name]) Active Problems:   Acute on chronic respiratory failure with hypoxia and hypercapnia ([name])   COPD (chronic obstructive pulmonary disease) ([name])   Acute kidney injury superimposed on CKD ([name])   OSA (obstructive sleep apnea)   Anxiety   Hypotension   Chest pain   Morbid obesity with BMI of 50.0-59.9, adult ([name])   Generalized weakness   Chronic colitis   Fluid overload   Eyelid cyst, right   Dry skin   Tracheitis  Acute on chronic diastolic CHF (congestive heart failure) ([name]) Initially admitted for volume overloaded.  Now appears to be euvolemic.   Currently patient is on Aldactone, torsemide. Monitor volume status closely   Atypical chest pain; resolved.  -EKG and trops are neg.    Acute on chronic respiratory failure with hypoxia and hypercapnia ([name]) Trach Dependent Pseudomonas tracheitis On trach collar. S/p abx course for pseudomonas tracheitis Also evaluated for non-massive hemoptysis with CT soft tissue of neck done on 02/08/2023 .  Tracheostomy tube is in place and there is no definite collection around  the tube.  There is thinning of the wall between the esophagus and trachea with possible defect. Currently stable on 2 L nasal cannula   COPD (chronic obstructive pulmonary disease) ([name]) Severe COPD on Combivent, as needed albuterol.  On yuperli nebulizer.   Acute kidney injury superimposed on CKD ([name]) Creatinine stable around 1.5   Anxiety On Atarax as needed.  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   Chronic colitis Stable Continue home mesalamine   Generalized weakness Patient has been motivated and working with physical therapy and mobility specialist.  Continue ambulating and exercises much as tolerated. Patient is appropriate for discharge to inpatient rehab versus skilled nursing facility Unfortunately we do not have a disposition plan at this time    Morbid obesity with BMI of 50.0-59.9, adult ([name]) Kates overall care and prognosis   Constipation Lactulose as needed    DVT prophylaxis: Lovenox Code Status: Full Family Communication: None Disposition Plan: Status is: Inpatient Remains inpatient appropriate because: Unsafe discharge plan.  Patient medically appropriate for discharge.  Would benefit from short rehab placement  Level of care: Med-Surg  Consultants:  None  Procedures:  None  Antimicrobials: None   Subjective: Seen and examined.  Resting in bed.  No distress.  No complaints.  Objective: Vitals:   03/15/23 1716 03/15/23 2323 03/15/23 2333 03/16/23 0853  BP: 114/72  113/77 108/62  Pulse: 88 83 86 77  Resp: 18  18 16   Temp: 97.7 F (36.5 C)  98.2 F (  36.8 C) 98.1 F (36.7 C)  TempSrc: Oral  Oral Oral  SpO2: 95% 97% 96% 96%  Weight:      Height:        Intake/Output Summary (Last 24 hours) at 03/16/2023 1057 Last data filed at 03/16/2023 0451 Gross per 24 hour  Intake 240 ml  Output 2050 ml  Net -1810 ml   Filed Weights   03/12/23 0500 03/13/23 0500  03/14/23 0500  Weight: (!) 192.4 kg (!) 192.8 kg (!) 191.5 kg    Examination:  General exam: No acute distress.  Appears fatigued Respiratory system: Diminished to bases.  Normal work of breathing.  2 L trach mask Cardiovascular system: S1-S2 RRR, no murmurs, no pedal edema Gastrointestinal system: Obese, soft, NT/ND, normal bowel sounds Central nervous system: Alert and oriented. No focal neurological deficits. Extremities: Symmetric 5 x 5 power. Skin: No rashes, lesions or ulcers Psychiatry: Judgement and insight appear normal. Mood & affect appropriate.     Data Reviewed: I have personally reviewed following labs and imaging studies  CBC: Recent Labs  Lab 03/11/23 0003  WBC 6.7  HGB 10.7*  HCT 39.1  MCV 76.2*  PLT 231   Basic Metabolic Panel: Recent Labs  Lab 03/11/23 0003  NA 137  K 4.0  CL 98  CO2 30  GLUCOSE 109*  BUN 38*  CREATININE 1.72*  CALCIUM 8.2*  MG 2.5*   GFR: Estimated Creatinine Clearance: 77.9 mL/min (A) (by C-G formula based on SCr of 1.72 mg/dL (H)). Liver Function Tests: No results for input(s): "AST", "ALT", "ALKPHOS", "BILITOT", "PROT", "ALBUMIN" in the last 168 hours. No results for input(s): "LIPASE", "AMYLASE" in the last 168 hours. No results for input(s): "AMMONIA" in the last 168 hours. Coagulation Profile: No results for input(s): "INR", "PROTIME" in the last 168 hours. Cardiac Enzymes: No results for input(s): "CKTOTAL", "CKMB", "CKMBINDEX", "TROPONINI" in the last 168 hours. BNP (last 3 results) No results for input(s): "PROBNP" in the last 8760 hours. HbA1C: No results for input(s): "HGBA1C" in the last 72 hours. CBG: No results for input(s): "GLUCAP" in the last 168 hours.  Lipid Profile: No results for input(s): "CHOL", "HDL", "LDLCALC", "TRIG", "CHOLHDL", "LDLDIRECT" in the last 72 hours. Thyroid Function Tests: No results for input(s): "TSH", "T4TOTAL", "FREET4", "T3FREE", "THYROIDAB" in the last 72 hours. Anemia  Panel: No results for input(s): "VITAMINB12", "FOLATE", "FERRITIN", "TIBC", "IRON", "RETICCTPCT" in the last 72 hours. Sepsis Labs: No results for input(s): "PROCALCITON", "LATICACIDVEN" in the last 168 hours.  No results found for this or any previous visit (from the past 240 hour(s)).       Radiology Studies: No results found.      Scheduled Meds:  amphetamine-dextroamphetamine  30 mg Oral Q breakfast   apixaban  2.5 mg Oral BID   bacitracin   Topical Daily   bisacodyl  10 mg Oral Daily   docusate sodium  100 mg Oral BID   escitalopram  10 mg Oral Daily   ferrous sulfate  325 mg Oral Daily   Gerhardt's butt cream   Topical Daily   guaiFENesin  1,200 mg Oral BID   Ipratropium-Albuterol  1 puff Inhalation Q6H   mouth rinse  15 mL Mouth Rinse 4 times per day   polyethylene glycol  17 g Oral BID   spironolactone  25 mg Oral Daily   torsemide  40 mg Oral BID   Continuous Infusions:   LOS: 106 days     Tresa Moore, MD [facility]  If 7PM-7AM, please contact night-coverage  03/16/2023, 10:57 AM

## 2023-03-17 DIAGNOSIS — I5033 Acute on chronic diastolic (congestive) heart failure: Secondary | ICD-10-CM | POA: Diagnosis not present

## 2023-03-17 NOTE — Progress Notes (Signed)
PROGRESS NOTE    Gary [name]  ZOX:[id number] DOB: 1955/10/22 DOA: 11/28/2022 PCP: Shayne Alken, MD    Brief Narrative:   Gary [name] is a 67 y.o. 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  Patient initially ran out of torsemide and was found to be in CHF exacerbation.  Eventually became euvolemic and transition to p.o. medications.  Patient has been on trach collar status post course of antibiotics for Pseudomonas tracheitis.  Overall stable on 2 L nasal cannula.   Currently awaiting safe disposition. Difficult to place due to tracheostomy  Assessment & Plan:   Principal Problem:   Acute on chronic diastolic CHF (congestive heart failure) ([name]) Active Problems:   Acute on chronic respiratory failure with hypoxia and hypercapnia ([name])   COPD (chronic obstructive pulmonary disease) ([name])   Acute kidney injury superimposed on CKD ([name])   OSA (obstructive sleep apnea)   Anxiety   Hypotension   Chest pain   Morbid obesity with BMI of 50.0-59.9, adult ([name])   Generalized weakness   Chronic colitis   Fluid overload   Eyelid cyst, right   Dry skin   Tracheitis  Acute on chronic diastolic CHF (congestive heart failure) ([name]) Initially admitted for volume overloaded.  Now appears to be euvolemic.   Currently patient is on Aldactone, torsemide. Monitor volume status closely Has been maintaining euvolemia   Atypical chest pain; resolved.  -EKG and trops are neg.    Acute on chronic respiratory failure with hypoxia and hypercapnia ([name]) Trach Dependent Pseudomonas tracheitis On trach collar. S/p abx course for pseudomonas tracheitis Also evaluated for non-massive hemoptysis with CT soft tissue of neck done on 02/08/2023 .  Tracheostomy tube is in place and there is  no definite collection around the tube.  There is thinning of the wall between the esophagus and trachea with possible defect. Currently stable on 2 L nasal cannula   COPD (chronic obstructive pulmonary disease) ([name]) Severe COPD on Combivent, as needed albuterol.  On yuperli nebulizer.   Acute kidney injury superimposed on CKD ([name]) Creatinine stable around 1.5   Anxiety On Atarax as needed.  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   Chronic colitis Stable Continue home mesalamine   Generalized weakness Patient has been motivated and working with physical therapy and mobility specialist.  Continue ambulating and exercises much as tolerated. Patient is appropriate for discharge to inpatient rehab versus skilled nursing facility Unfortunately we do not have a disposition plan at this time    Morbid obesity with BMI of 50.0-59.9, adult ([name]) Kates overall care and prognosis   Constipation Lactulose as needed    DVT prophylaxis: Lovenox Code Status: Full Family Communication: None Disposition Plan: Status is: Inpatient Remains inpatient appropriate because: Unsafe discharge plan.  Patient medically appropriate for discharge.  Would benefit from short rehab placement  Level of care: Med-Surg  Consultants:  None  Procedures:  None  Antimicrobials: None   Subjective: Seen and examined.  Resting in bed.  No visible distress.  Motivated to ambulate.  Objective: Vitals:   03/15/23 2323 03/15/23 2333 03/16/23 0853 03/17/23 0823  BP:  113/77 108/62 111/75  Pulse: 83 86 77 80  Resp:  18 16 18   Temp:  98.2 F (36.8 C) 98.1 F (36.7 C) 98.5 F (36.9 C)  TempSrc:  Oral Oral Oral  SpO2: 97% 96% 96% 94%  Weight:      Height:        Intake/Output Summary (Last 24 hours) at 03/17/2023 1107 Last data filed at 03/17/2023 0910 Gross per 24 hour  Intake 740 ml  Output 2600 ml  Net -1860 ml    Filed Weights   03/12/23 0500 03/13/23 0500 03/14/23 0500  Weight: (!) 192.4 kg (!) 192.8 kg (!) 191.5 kg    Examination:  General exam: No acute distress Respiratory system: Diminished to bases.  Normal work of breathing.  2 L trach mask Cardiovascular system: S1-S2 RRR, no murmurs, no pedal edema Gastrointestinal system: Obese, soft, NT/ND, normal bowel sounds Central nervous system: Alert and oriented. No focal neurological deficits. Extremities: Symmetric 5 x 5 power. Skin: No rashes, lesions or ulcers Psychiatry: Judgement and insight appear normal. Mood & affect appropriate.     Data Reviewed: I have personally reviewed following labs and imaging studies  CBC: Recent Labs  Lab 03/11/23 0003  WBC 6.7  HGB 10.7*  HCT 39.1  MCV 76.2*  PLT 231   Basic Metabolic Panel: Recent Labs  Lab 03/11/23 0003  NA 137  K 4.0  CL 98  CO2 30  GLUCOSE 109*  BUN 38*  CREATININE 1.72*  CALCIUM 8.2*  MG 2.5*   GFR: Estimated Creatinine Clearance: 77.9 mL/min (A) (by C-G formula based on SCr of 1.72 mg/dL (H)). Liver Function Tests: No results for input(s): "AST", "ALT", "ALKPHOS", "BILITOT", "PROT", "ALBUMIN" in the last 168 hours. No results for input(s): "LIPASE", "AMYLASE" in the last 168 hours. No results for input(s): "AMMONIA" in the last 168 hours. Coagulation Profile: No results for input(s): "INR", "PROTIME" in the last 168 hours. Cardiac Enzymes: No results for input(s): "CKTOTAL", "CKMB", "CKMBINDEX", "TROPONINI" in the last 168 hours. BNP (last 3 results) No results for input(s): "PROBNP" in the last 8760 hours. HbA1C: No results for input(s): "HGBA1C" in the last 72 hours. CBG: No results for input(s): "GLUCAP" in the last 168 hours.  Lipid Profile: No results for input(s): "CHOL", "HDL", "LDLCALC", "TRIG", "CHOLHDL", "LDLDIRECT" in the last 72 hours. Thyroid Function Tests: No results for input(s): "TSH", "T4TOTAL", "FREET4", "T3FREE", "THYROIDAB" in  the last 72 hours. Anemia Panel: No results for input(s): "VITAMINB12", "FOLATE", "FERRITIN", "TIBC", "IRON", "RETICCTPCT" in the last 72 hours. Sepsis Labs: No results for input(s): "PROCALCITON", "LATICACIDVEN" in the last 168 hours.  No results found for this or any previous visit (from the past 240 hour(s)).       Radiology Studies: No results found.      Scheduled Meds:  amphetamine-dextroamphetamine  30 mg Oral Q breakfast   apixaban  2.5 mg Oral BID   bacitracin   Topical Daily   bisacodyl  10 mg Oral Daily   docusate sodium  100 mg Oral BID   escitalopram  10 mg Oral Daily   ferrous sulfate  325 mg Oral Daily   Gerhardt's butt cream   Topical Daily   guaiFENesin  1,200 mg Oral BID   Ipratropium-Albuterol  1 puff Inhalation Q6H   mouth rinse  15 mL Mouth Rinse 4 times per day   polyethylene glycol  17 g Oral BID   spironolactone  25 mg Oral Daily   torsemide  40 mg Oral BID   Continuous Infusions:   LOS: 107 days     Tresa Moore,  MD [facility]   If 7PM-7AM, please contact night-coverage  03/17/2023, 11:07 AM

## 2023-03-17 NOTE — Plan of Care (Signed)
  Problem: Health Behavior/Discharge Planning: Goal: Ability to manage health-related needs will improve Outcome: Not Progressing   

## 2023-03-17 NOTE — Plan of Care (Signed)
  Problem: Pain Managment: Goal: General experience of comfort will improve Outcome: Progressing   Problem: Coping: Goal: Level of anxiety will decrease Outcome: Progressing   Problem: Health Behavior/Discharge Planning: Goal: Ability to manage health-related needs will improve Outcome: Progressing

## 2023-03-18 DIAGNOSIS — J9601 Acute respiratory failure with hypoxia: Secondary | ICD-10-CM | POA: Diagnosis not present

## 2023-03-18 DIAGNOSIS — J441 Chronic obstructive pulmonary disease with (acute) exacerbation: Secondary | ICD-10-CM | POA: Diagnosis not present

## 2023-03-18 DIAGNOSIS — E8770 Fluid overload, unspecified: Secondary | ICD-10-CM | POA: Diagnosis not present

## 2023-03-18 DIAGNOSIS — I5033 Acute on chronic diastolic (congestive) heart failure: Secondary | ICD-10-CM | POA: Diagnosis not present

## 2023-03-18 DIAGNOSIS — J9602 Acute respiratory failure with hypercapnia: Secondary | ICD-10-CM | POA: Diagnosis not present

## 2023-03-18 LAB — MAGNESIUM: Magnesium: 2.2 mg/dL (ref 1.7–2.4)

## 2023-03-18 LAB — CBC
HCT: 38.1 % — ABNORMAL LOW (ref 39.0–52.0)
Hemoglobin: 10.3 g/dL — ABNORMAL LOW (ref 13.0–17.0)
MCH: 21.2 pg — ABNORMAL LOW (ref 26.0–34.0)
MCHC: 27 g/dL — ABNORMAL LOW (ref 30.0–36.0)
MCV: 78.6 fL — ABNORMAL LOW (ref 80.0–100.0)
Platelets: 216 10*3/uL (ref 150–400)
RBC: 4.85 MIL/uL (ref 4.22–5.81)
RDW: 18.5 % — ABNORMAL HIGH (ref 11.5–15.5)
WBC: 7.5 10*3/uL (ref 4.0–10.5)
nRBC: 0.3 % — ABNORMAL HIGH (ref 0.0–0.2)

## 2023-03-18 LAB — BASIC METABOLIC PANEL
Anion gap: 9 (ref 5–15)
BUN: 33 mg/dL — ABNORMAL HIGH (ref 8–23)
CO2: 32 mmol/L (ref 22–32)
Calcium: 8.3 mg/dL — ABNORMAL LOW (ref 8.9–10.3)
Chloride: 97 mmol/L — ABNORMAL LOW (ref 98–111)
Creatinine, Ser: 1.61 mg/dL — ABNORMAL HIGH (ref 0.61–1.24)
GFR, Estimated: 47 mL/min — ABNORMAL LOW (ref >60)
Glucose, Bld: 104 mg/dL — ABNORMAL HIGH (ref 70–99)
Potassium: 3.9 mmol/L (ref 3.5–5.1)
Sodium: 138 mmol/L (ref 135–145)

## 2023-03-18 NOTE — Progress Notes (Signed)
Patient became upset because his purwick came off. I told patient that he has to wait, because staff were sitting with other patients and will be in shortly.He began cussing at staff, and security was called.

## 2023-03-18 NOTE — Progress Notes (Signed)
PROGRESS NOTE    Gary [name]  UJW:[id number] DOB: 12-Jul-1955 DOA: 11/28/2022 PCP: Shayne Alken, MD    Brief Narrative:   Gary [name] is a 67 y.o. 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  Patient initially ran out of torsemide and was found to be in CHF exacerbation.  Eventually became euvolemic and transition to p.o. medications.  Patient has been on trach collar status post course of antibiotics for Pseudomonas tracheitis.  Overall stable on 2 L nasal cannula.   Currently awaiting safe disposition. Difficult to place due to tracheostomy  Assessment & Plan:   Principal Problem:   Acute on chronic diastolic CHF (congestive heart failure) ([name]) Active Problems:   Acute on chronic respiratory failure with hypoxia and hypercapnia ([name])   COPD (chronic obstructive pulmonary disease) ([name])   Acute kidney injury superimposed on CKD ([name])   OSA (obstructive sleep apnea)   Anxiety   Hypotension   Chest pain   Morbid obesity with BMI of 50.0-59.9, adult ([name])   Generalized weakness   Chronic colitis   Fluid overload   Eyelid cyst, right   Dry skin   Tracheitis  Acute on chronic diastolic CHF (congestive heart failure) ([name]) Initially admitted for volume overloaded.  Now appears to be euvolemic.   Currently patient is on Aldactone, torsemide. Monitor volume status closely Has been maintaining euvolemia   Atypical chest pain; resolved.  -EKG and trops are neg.    Acute on chronic respiratory failure with hypoxia and hypercapnia ([name]) Trach Dependent Pseudomonas tracheitis On trach collar. S/p abx course for pseudomonas tracheitis Also evaluated for non-massive hemoptysis with CT soft tissue of neck done on 02/08/2023 .  Tracheostomy tube is in place and there is  no definite collection around the tube.  There is thinning of the wall between the esophagus and trachea with possible defect. Currently stable on 2 L nasal cannula   COPD (chronic obstructive pulmonary disease) ([name]) Severe COPD on Combivent, as needed albuterol.  On yuperli nebulizer.   Acute kidney injury superimposed on CKD ([name]) Creatinine stable around 1.5   Anxiety On Atarax as needed.  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   Chronic colitis Stable Continue home mesalamine   Generalized weakness Patient has been motivated and working with physical therapy and mobility specialist.  Continue ambulating and exercises much as tolerated. Patient is appropriate for discharge to inpatient rehab versus skilled nursing facility Unfortunately we do not have a disposition plan at this time    Morbid obesity with BMI of 50.0-59.9, adult ([name]) Complicating factor to overall care and prognosis.  Registered dietitian reengaged on 12/2.  Spent 30 minutes discussing dietary intake with patient.  At this time we will switch to a low-sodium 2 g diet.   Constipation Lactulose as needed    DVT prophylaxis: Lovenox Code Status: Full Family Communication: None Disposition Plan: Status is: Inpatient Remains inpatient appropriate because: Unsafe discharge plan.  Patient medically appropriate for discharge.  Would benefit from short rehab placement  Level of care: Med-Surg  Consultants:  None  Procedures:  None  Antimicrobials: None   Subjective: Seen and examined.  Resting in bed.  No visible distress.  No complaints of pain.  Objective: Vitals:  03/17/23 1647 03/17/23 2201 03/18/23 0736 03/18/23 0759  BP: (!) 89/61 102/68  92/68  Pulse: 89 84  81  Resp: 18 16  18   Temp: 98.4 F (36.9 C) 98.1 F (36.7 C)  97.7 F (36.5 C)  TempSrc: Oral     SpO2: 94% 91% 94% 97%  Weight:      Height:         Intake/Output Summary (Last 24 hours) at 03/18/2023 1223 Last data filed at 03/18/2023 0600 Gross per 24 hour  Intake --  Output 4200 ml  Net -4200 ml   Filed Weights   03/12/23 0500 03/13/23 0500 03/14/23 0500  Weight: (!) 192.4 kg (!) 192.8 kg (!) 191.5 kg    Examination:  General exam: NAD Respiratory system: Bibasilar crackles.  Normal work of breathing.  2 L trach mask Cardiovascular system: S1-S2 RRR, no murmurs, no pedal edema Gastrointestinal system: Obese, soft, NT/ND, normal bowel sounds Central nervous system: Alert and oriented. No focal neurological deficits. Extremities: Symmetric 5 x 5 power. Skin: No rashes, lesions or ulcers Psychiatry: Judgement and insight appear normal. Mood & affect appropriate.     Data Reviewed: I have personally reviewed following labs and imaging studies  CBC: Recent Labs  Lab 03/18/23 0803  WBC 7.5  HGB 10.3*  HCT 38.1*  MCV 78.6*  PLT 216   Basic Metabolic Panel: Recent Labs  Lab 03/18/23 0803  NA 138  K 3.9  CL 97*  CO2 32  GLUCOSE 104*  BUN 33*  CREATININE 1.61*  CALCIUM 8.3*  MG 2.2   GFR: Estimated Creatinine Clearance: 83.3 mL/min (A) (by C-G formula based on SCr of 1.61 mg/dL (H)). Liver Function Tests: No results for input(s): "AST", "ALT", "ALKPHOS", "BILITOT", "PROT", "ALBUMIN" in the last 168 hours. No results for input(s): "LIPASE", "AMYLASE" in the last 168 hours. No results for input(s): "AMMONIA" in the last 168 hours. Coagulation Profile: No results for input(s): "INR", "PROTIME" in the last 168 hours. Cardiac Enzymes: No results for input(s): "CKTOTAL", "CKMB", "CKMBINDEX", "TROPONINI" in the last 168 hours. BNP (last 3 results) No results for input(s): "PROBNP" in the last 8760 hours. HbA1C: No results for input(s): "HGBA1C" in the last 72 hours. CBG: No results for input(s): "GLUCAP" in the last 168 hours.  Lipid Profile: No results for input(s): "CHOL", "HDL", "LDLCALC", "TRIG",  "CHOLHDL", "LDLDIRECT" in the last 72 hours. Thyroid Function Tests: No results for input(s): "TSH", "T4TOTAL", "FREET4", "T3FREE", "THYROIDAB" in the last 72 hours. Anemia Panel: No results for input(s): "VITAMINB12", "FOLATE", "FERRITIN", "TIBC", "IRON", "RETICCTPCT" in the last 72 hours. Sepsis Labs: No results for input(s): "PROCALCITON", "LATICACIDVEN" in the last 168 hours.  No results found for this or any previous visit (from the past 240 hour(s)).       Radiology Studies: No results found.      Scheduled Meds:  amphetamine-dextroamphetamine  30 mg Oral Q breakfast   apixaban  2.5 mg Oral BID   bacitracin   Topical Daily   bisacodyl  10 mg Oral Daily   docusate sodium  100 mg Oral BID   escitalopram  10 mg Oral Daily   ferrous sulfate  325 mg Oral Daily   Gerhardt's butt cream   Topical Daily   guaiFENesin  1,200 mg Oral BID   Ipratropium-Albuterol  1 puff Inhalation Q6H   mouth rinse  15 mL Mouth Rinse 4 times per day   polyethylene glycol  17 g Oral BID   spironolactone  25 mg  Oral Daily   torsemide  40 mg Oral BID   Continuous Infusions:   LOS: 108 days     Tresa Moore, MD [facility]   If 7PM-7AM, please contact night-coverage  03/18/2023, 12:23 PM
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
## 2023-03-18 NOTE — Progress Notes (Signed)
Mobility Specialist - Progress Note   03/18/23 1625  Mobility  Activity Transferred from chair to bed  Level of Assistance +2 (takes two people)  Assistive Device  Water quality scientist)  Activity Response Tolerated well  $Mobility charge 1 Mobility  Mobility Specialist Start Time (ACUTE ONLY) 1517  Mobility Specialist Stop Time (ACUTE ONLY) 1531  Mobility Specialist Time Calculation (min) (ACUTE ONLY) 14 min   Pt transferred chair to bed via [employer] lift +2 for safety. Pt left in fowler position with needs within reach.  Zetta Bills Mobility Specialist 03/18/23 4:28 PM

## 2023-03-18 NOTE — TOC Progression Note (Addendum)
Transition of Care [facility]) - Progression Note    Patient Details  Name: Gary [name] MRN: [id number] Date of Birth: [birth year]  [facility] CM/SW Contact  Marlowe Sax, RN Phone Number: 03/18/2023, 4:23 PM  Clinical Narrative:     The patient has a hospital bed at home, The hospital has a special order 3 in 1 for him to take home at DC, he has been approved to get a high lift chair and the company will come out to measure for it once he is home, he can get [facility] including PT, OT, RN, he can get meals delivered weekly thru a company called [employer] that is a IT consultant company at no cost The number to call for meal delivery is [phone]  He is on 2 liters of oxygen via [place] and at times is taking it off and is on room air  Expected Discharge Plan: Home w Home Health Services Barriers to Discharge: Continued Medical Work up  Expected Discharge Plan and Services     Post Acute Care Choice: Home Health   Expected Discharge Date: 02/26/23               DME Arranged: Bedside commode DME Agency: [employer] Date DME Agency Contacted: 01/12/23 Time DME Agency Contacted: 1427 Representative spoke with at DME Agency: [facility] Arranged: PT, OT HH Agency: [facility] Date [facility] Agency Contacted: 01/12/23 Time HH Agency Contacted: 1428 Representative spoke with at [facility] Agency: Kandee Keen   Social Determinants of Health (SDOH) Interventions SDOH Screenings   Food Insecurity: No Food Insecurity (12/01/2022)  Housing: Patient Declined (12/01/2022)  Transportation Needs: No Transportation Needs (12/01/2022)  Utilities: Not At Risk (12/01/2022)  Depression (PHQ2-9): Low Risk  (06/07/2021)  Financial Resource Strain: High Risk (11/21/2022)   Received from  [facility]  Physical Activity: Insufficiently Active (05/03/2017)  Social Connections: Patient Declined (11/20/2022)   Received from [facility]  Stress: Stress Concern Present (11/20/2022)   Received from [facility]   Tobacco Use: Medium Risk (02/13/2023)    Readmission Risk Interventions    11/05/2022    2:59 PM 11/05/2022   12:16 PM 12/31/2021    8:58 AM  Readmission Risk Prevention Plan  Transportation Screening Complete Complete Complete  Medication Review Oceanographer) Complete Complete Complete  PCP or Specialist appointment within 3-5 days of discharge Complete Complete Complete  HRI or Home Care Consult Complete Complete Complete  SW Recovery Care/Counseling Consult Complete Complete Complete  Palliative Care Screening Complete Complete Complete  Skilled Nursing Facility Not Applicable Not Applicable Not Applicable

## 2023-03-18 NOTE — Progress Notes (Addendum)
Brief Nutrition Follow-Up Note  RD re-consulted for assessment of nutritional needs and diet education.   Wt Readings from Last 15 Encounters:  03/14/23 (!) 191.5 kg  11/12/22 (!) 197.2 kg  10/16/22 (!) 204.1 kg  10/03/22 (!) 218.4 kg  09/12/22 (!) 180.7 kg  08/21/22 (!) 182.4 kg  04/14/22 (!) 160.4 kg  04/06/22 (!) 193.2 kg  01/19/22 (!) 171.5 kg  01/03/22 (!) 171.5 kg  12/26/21 (!) 173.1 kg  12/12/21 (!) 183.7 kg  08/21/21 (!) 171.5 kg  08/15/21 (!) 181.8 kg  07/23/21 (!) 185.5 kg   Reviewed I/O's: -4.5 L x 24 hours and -25.7 L since 03/04/23  UOP: 5 L x 24 hours  Consult received. TOC and MD requesting low calorie diet to assist with weight loss.   Noted pt was ordered a regular diet on 03/09/23 (previously on a 2 gram sodium diet). MD and TOC voiced concern over pt's weight, as this has restricted pt's placement. Pt exceeds weight limit of 350# of most local facilities.   RD changed diet to 2 gram sodium to help align with medical history. Would not recommend low calorie diet for the sole purpose of weight loss due to risk of losing lean body mass which would impact rehab potential.  Spoke with pt at bedside. Pt reports frustration over the being taken off of a regular diet. Per pt, he does not think his food choices were much different when he was allowed to select freely vs when he was on a restricted diet. Pt reports he was placed on a regular diet last week and was able to order foods such as bacon. He feels frustrated that he is unable to order the foods that we wants, but does not think that his food choices are impacting his health. Pt reports he usually eats eggs, fruit, and sandwiches.   RD reviewed pt's medical history with pt, reason for hospital admission, and rationale about how 2 gram sodium diet is the most optimal diet for him to support his medical conditions and continued progress. Discussed favorite foods that he could order on the diet and that RD intent was  to put pt on least restrictive diet in order to support his medical needs and progress. Pt shared that he is pleased with the progress he has made and shared that he has been able to walk around the nurses station. RD discussed how the medical team and recommendations made are here to support him to meet his goals, however, that he is ultimately responsible to following the care plan recommended to him. Pt accepted explanation and thanked RD for visit.   Care plan discussed with RN, MD, and TOC.    Pt has daily weights ordered and is on sodium restricted diet. RD does not have control over food and beverages that are provided outside of meal trays/ nutritional services department. There is nothing further to offer for a nutritional standpoint.   Body mass index is 51.39 kg/m. Patient meets criteria for obesity, class III based on current BMI. Obesity is a complex, chronic medical condition that is optimally managed by a multidisciplinary care team. Weight loss is not an ideal goal for an acute inpatient hospitalization. However, if further work-up for obesity is warranted, consider outpatient referral to [facility].    Current diet order is regular (transitioned to 2 gram sodium diet), patient is consuming approximately 100% of meals at this time. Labs and medications reviewed.   No  nutrition interventions warranted at this time. If nutrition issues arise, please consult RD.   Levada Schilling, RD, LDN, CDCES Registered Dietitian III Certified Diabetes Care and Education Specialist Please refer to [facility] for RD and/or RD on-call/weekend/after hours pager

## 2023-03-18 NOTE — TOC Progression Note (Signed)
Transition of Care [facility]) - Progression Note    Patient Details  Name: Gary [name] MRN: [id number] Date of Birth: [birth year]  Transition of Care [facility]) CM/SW Contact  Marlowe Sax, RN Phone Number: 03/18/2023, 9:16 AM  Clinical Narrative:    TOC continues to follow the patient and assist with DC planning, no bed offers at any of the facilities in the surrounding area, weight limit is 350 lbs which the patient exceeds   Expected Discharge Plan: Home w Home Health Services Barriers to Discharge: Continued Medical Work up  Expected Discharge Plan and Services     Post Acute Care Choice: Home Health   Expected Discharge Date: 02/26/23               DME Arranged: Bedside commode DME Agency: [employer] Date DME Agency Contacted: 01/12/23 Time DME Agency Contacted: 1427 Representative spoke with at DME Agency: [facility] HH Arranged: PT, OT HH Agency: [facility] Date [facility] Agency Contacted: 01/12/23 Time HH Agency Contacted: 1428 Representative spoke with at [facility] Agency: Kandee Keen   Social Determinants of Health (SDOH) Interventions SDOH Screenings   Food Insecurity: No Food Insecurity (12/01/2022)  Housing: Patient Declined (12/01/2022)  Transportation Needs: No Transportation Needs (12/01/2022)  Utilities: Not At Risk (12/01/2022)  Depression (PHQ2-9): Low Risk  (06/07/2021)  Financial Resource Strain: High Risk (11/21/2022)   Received from [facility]  Physical Activity: Insufficiently Active (05/03/2017)  Social Connections: Patient Declined (11/20/2022)   Received from [facility]  Stress: Stress Concern Present (11/20/2022)   Received from [facility]  Tobacco Use: Medium Risk (02/13/2023)    Readmission Risk Interventions    11/05/2022    2:59 PM 11/05/2022   12:16 PM 12/31/2021    8:58 AM  Readmission Risk Prevention Plan  Transportation Screening Complete Complete Complete  Medication Review Oceanographer) Complete Complete Complete  PCP or Specialist  appointment within 3-5 days of discharge Complete Complete Complete  HRI or Home Care Consult Complete Complete Complete  SW Recovery Care/Counseling Consult Complete Complete Complete  Palliative Care Screening Complete Complete Complete  Skilled Nursing Facility Not Applicable Not Applicable Not Applicable

## 2023-03-18 NOTE — Progress Notes (Signed)
Physical Therapy Treatment Patient Details Name: Gary [name] MRN: [id number] DOB: [birth year] Today's Date: 03/18/2023   History of Present Illness 68 y/o male presented to [facility] ED on 11/28/22 for chest pain x 4 days. Admitted for acute on chronic CHF. Frequent admissions this year with most recent dsicharge on 11/21/22. PMH includes obesity, hypoxia, respiratory failure with tracheostomy, COPD, GIB, HFpEF.    PT Comments  Pt was pleasant and motivated to participate during the session and put forth good effort throughout. Pt was able to perform bed mobility with Superv for safety, pt self-directing sequencing. Pt performed transfers with Superv, continuing to insist on use of significantly-elevated surface height. Pt amb in hall with BRW and CGA, safe gait pattern but exhibiting SOB on 4-6L O2 throughout per pt request, pt reporting feeling of "legs giving out" at end of gait. Pt vitals monitored and WNL throughout session, pt left on 2L O2 in room as found, reporting no other adverse symptoms throughout. Pt will benefit from continued PT services upon discharge to safely address deficits listed in patient problem list for decreased caregiver assistance and eventual return to PLOF.      If plan is discharge home, recommend the following: A lot of help with bathing/dressing/bathroom;Assist for transportation;A little help with walking and/or transfers   Can travel by private vehicle     No  Equipment Recommendations  Other (comment) (TBD at next venue of care)    Recommendations for Other Services       Precautions / Restrictions Precautions Precautions: Fall Restrictions Weight Bearing Restrictions: No RLE Weight Bearing: Weight bearing as tolerated     Mobility  Bed Mobility Overal bed mobility: Needs Assistance Bed Mobility: Supine to Sit     Supine to sit: Supervision     General bed mobility comments: completed slowly, self-directed pace and sequencing     Transfers Overall transfer level: Needs assistance Equipment used:  (BRW) Transfers: Sit to/from Stand Sit to Stand: Supervision, From elevated surface           General transfer comment: Pt self selected significantly elevated bed height for transfer, completing slowly but steady throughout; quickly descends from standing despite cuing for controlled descent at end of gait    Ambulation/Gait Ambulation/Gait assistance: Contact guard assist Gait Distance (Feet): 80 Feet Assistive device:  (BRW) Gait Pattern/deviations: Step-through pattern, Decreased stride length Gait velocity: decreased     General Gait Details: able to maintain consistent cadence with symmetrical step lengths, steady throughout, took 2 standing rest breaks in hall before requesting to sit, initially on 4L O2 before raising to 6L O2 given pt heavy breathing and request   Stairs             Wheelchair Mobility     Tilt Bed    Modified Rankin (Stroke Patients Only)       Balance Overall balance assessment: Needs assistance Sitting-balance support: No upper extremity supported, Feet supported Sitting balance-[name] Scale: Normal     Standing balance support: Bilateral upper extremity supported, During functional activity Standing balance-[name] Scale: Fair Standing balance comment: Reliant on BRW for all standing activity, no LOBs throughout                            Cognition Arousal: Alert Behavior During Therapy: WFL for tasks assessed/performed Overall Cognitive Status: Within Functional Limits for tasks assessed  Exercises      General Comments        Pertinent Vitals/Pain Pain Assessment Pain Assessment: No/denies pain Pain Intervention(s): Monitored during session    Home Living                          Prior Function            PT Goals (current goals can now be found in the care plan  section) Acute Rehab PT Goals Patient Stated Goal: Get better so I can get out of here. PT Goal Formulation: With patient Time For Goal Achievement: 03/11/23 Potential to Achieve Goals: Good Progress towards PT goals: Progressing toward goals    Frequency    Min 1X/week      PT Plan      Co-evaluation              AM-PAC PT "6 Clicks" Mobility   Outcome Measure  Help needed turning from your back to your side while in a flat bed without using bedrails?: None Help needed moving from lying on your back to sitting on the side of a flat bed without using bedrails?: A Little Help needed moving to and from a bed to a chair (including a wheelchair)?: A Lot Help needed standing up from a chair using your arms (e.g., wheelchair or bedside chair)?: A Little Help needed to walk in hospital room?: A Little Help needed climbing 3-5 steps with a railing? : Total 6 Click Score: 16    End of Session Equipment Utilized During Treatment: Oxygen (2L at beginning/end of session, up to 6L with amb) Activity Tolerance: Patient tolerated treatment well;Patient limited by fatigue Patient left: in chair;with call bell/phone within reach Nurse Communication: Mobility status PT Visit Diagnosis: Muscle weakness (generalized) (M62.81);Difficulty in walking, not elsewhere classified (R26.2)     Time: 0865-7846 PT Time Calculation (min) (ACUTE ONLY): 24 min  Charges:                           Rosiland Oz SPT 03/18/23, 4:26 PM

## 2023-03-18 NOTE — Plan of Care (Signed)
  Problem: Education: Goal: Knowledge of General Education information will improve Description: Including pain rating scale, medication(s)/side effects and non-pharmacologic comfort measures Outcome: Progressing   Problem: Health Behavior/Discharge Planning: Goal: Ability to manage health-related needs will improve Outcome: Progressing   Problem: Clinical Measurements: Goal: Ability to maintain clinical measurements within normal limits will improve Outcome: Progressing Goal: Will remain free from infection Outcome: Progressing Goal: Respiratory complications will improve Outcome: Progressing   Problem: Nutrition: Goal: Adequate nutrition will be maintained Outcome: Progressing   Problem: Skin Integrity: Goal: Risk for impaired skin integrity will decrease Outcome: Progressing

## 2023-03-18 NOTE — Plan of Care (Signed)
  Problem: Education: Goal: Knowledge of General Education information will improve Description: Including pain rating scale, medication(s)/side effects and non-pharmacologic comfort measures Outcome: Progressing   Problem: Nutrition: Goal: Adequate nutrition will be maintained Outcome: Progressing   Problem: Activity: Goal: Risk for activity intolerance will decrease Outcome: Progressing   Problem: Safety: Goal: Ability to remain free from injury will improve Outcome: Progressing

## 2023-03-19 DIAGNOSIS — I5033 Acute on chronic diastolic (congestive) heart failure: Secondary | ICD-10-CM | POA: Diagnosis not present

## 2023-03-19 NOTE — Progress Notes (Signed)
PROGRESS NOTE    SCOTTE GILDEA  UJW:[id number] DOB: 27-Aug-1955 DOA: 11/28/2022 PCP: Shayne Alken, MD    Brief Narrative:   Gary [name] is a 67 y.o. 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  Patient initially ran out of torsemide and was found to be in CHF exacerbation.  Eventually became euvolemic and transition to p.o. medications.  Patient has been on trach collar status post course of antibiotics for Pseudomonas tracheitis.  Overall stable on 2 L nasal cannula.   Currently awaiting safe disposition. Difficult to place due to tracheostomy  Assessment & Plan:   Principal Problem:   Acute on chronic diastolic CHF (congestive heart failure) ([name]) Active Problems:   Acute on chronic respiratory failure with hypoxia and hypercapnia ([name])   COPD (chronic obstructive pulmonary disease) ([name])   Acute kidney injury superimposed on CKD ([name])   OSA (obstructive sleep apnea)   Anxiety   Hypotension   Chest pain   Morbid obesity with BMI of 50.0-59.9, adult ([name])   Generalized weakness   Chronic colitis   Fluid overload   Eyelid cyst, right   Dry skin   Tracheitis  Acute on chronic diastolic CHF (congestive heart failure) ([name]) Initially admitted for volume overloaded.  Now appears to be euvolemic.   Currently patient is on Aldactone, torsemide. Monitor volume status closely Has been maintaining euvolemia   Atypical chest pain; resolved.  -EKG and trops are neg.    Acute on chronic respiratory failure with hypoxia and hypercapnia ([name]) Trach Dependent Pseudomonas tracheitis On trach collar. S/p abx course for pseudomonas tracheitis Also evaluated for non-massive hemoptysis with CT soft tissue of neck done on 02/08/2023 .  Tracheostomy tube is in place and there is  no definite collection around the tube.  There is thinning of the wall between the esophagus and trachea with possible defect. Currently stable on 2 L nasal cannula   COPD (chronic obstructive pulmonary disease) ([name]) Severe COPD on Combivent, as needed albuterol.  On yuperli nebulizer.   Acute kidney injury superimposed on CKD ([name]) Creatinine stable   Anxiety On Atarax as needed.  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   Chronic colitis Stable Continue home mesalamine   Generalized weakness Patient has been motivated and working with physical therapy and mobility specialist.  Continue ambulating and exercises much as tolerated. Patient is appropriate for discharge to inpatient rehab versus skilled nursing facility Unfortunately we do not have a disposition plan at this time    Morbid obesity with BMI of 50.0-59.9, adult ([name]) Complicating factor to overall care and prognosis.  Registered dietitian reengaged on 12/2.  Spent 30 minutes discussing dietary intake with patient.  Patient is agreeable and is tolerating a low-sodium 2 g diet   Constipation Lactulose as needed    DVT prophylaxis: Lovenox Code Status: Full Family Communication: None Disposition Plan: Status is: Inpatient Remains inpatient appropriate because: Unsafe discharge plan.  Patient medically appropriate for discharge.  Would benefit from short rehab placement.  We are unable to find an accepting facility  Level of care: Med-Surg  Consultants:  None  Procedures:  None  Antimicrobials: None   Subjective: Seen and examined.  Lying in bed.  No visible distress.  No  complaints of pain.  States he is motivated to move.  Objective: Vitals:   03/18/23 0736 03/18/23 0759 03/19/23 0500 03/19/23 0732  BP:  92/68  99/76  Pulse:  81  81  Resp:  18  18  Temp:  97.7 F (36.5 C)  98.3 F (36.8 C)  TempSrc:    Oral  SpO2: 94% 97%   99%  Weight:   (!) 193.8 kg   Height:        Intake/Output Summary (Last 24 hours) at 03/19/2023 1209 Last data filed at 03/19/2023 0931 Gross per 24 hour  Intake 540 ml  Output 1950 ml  Net -1410 ml   Filed Weights   03/13/23 0500 03/14/23 0500 03/19/23 0500  Weight: (!) 192.8 kg (!) 191.5 kg (!) 193.8 kg    Examination:  General exam: No acute distress Respiratory system: Bibasilar crackles.  Normal work of breathing.  2 L trach mask Cardiovascular system: S1-S2, RRR, no murmurs, no pedal edema Gastrointestinal system: Obese, soft, NT/ND, normal bowel sounds Central nervous system: Alert and oriented. No focal neurological deficits. Extremities: Power bilateral lower extremities Skin: No rashes, lesions or ulcers Psychiatry: Judgement and insight appear normal. Mood & affect appropriate.     Data Reviewed: I have personally reviewed following labs and imaging studies  CBC: Recent Labs  Lab 03/18/23 0803  WBC 7.5  HGB 10.3*  HCT 38.1*  MCV 78.6*  PLT 216   Basic Metabolic Panel: Recent Labs  Lab 03/18/23 0803  NA 138  K 3.9  CL 97*  CO2 32  GLUCOSE 104*  BUN 33*  CREATININE 1.61*  CALCIUM 8.3*  MG 2.2   GFR: Estimated Creatinine Clearance: 83.9 mL/min (A) (by C-G formula based on SCr of 1.61 mg/dL (H)). Liver Function Tests: No results for input(s): "AST", "ALT", "ALKPHOS", "BILITOT", "PROT", "ALBUMIN" in the last 168 hours. No results for input(s): "LIPASE", "AMYLASE" in the last 168 hours. No results for input(s): "AMMONIA" in the last 168 hours. Coagulation Profile: No results for input(s): "INR", "PROTIME" in the last 168 hours. Cardiac Enzymes: No results for input(s): "CKTOTAL", "CKMB", "CKMBINDEX", "TROPONINI" in the last 168 hours. BNP (last 3 results) No results for input(s): "PROBNP" in the last 8760 hours. HbA1C: No results for input(s): "HGBA1C" in the last 72 hours. CBG: No results for input(s): "GLUCAP" in the last 168  hours.  Lipid Profile: No results for input(s): "CHOL", "HDL", "LDLCALC", "TRIG", "CHOLHDL", "LDLDIRECT" in the last 72 hours. Thyroid Function Tests: No results for input(s): "TSH", "T4TOTAL", "FREET4", "T3FREE", "THYROIDAB" in the last 72 hours. Anemia Panel: No results for input(s): "VITAMINB12", "FOLATE", "FERRITIN", "TIBC", "IRON", "RETICCTPCT" in the last 72 hours. Sepsis Labs: No results for input(s): "PROCALCITON", "LATICACIDVEN" in the last 168 hours.  No results found for this or any previous visit (from the past 240 hour(s)).       Radiology Studies: No results found.      Scheduled Meds:  amphetamine-dextroamphetamine  30 mg Oral Q breakfast   apixaban  2.5 mg Oral BID   bacitracin   Topical Daily   bisacodyl  10 mg Oral Daily   docusate sodium  100 mg Oral BID   escitalopram  10 mg Oral Daily   ferrous sulfate  325 mg Oral Daily   Gerhardt's butt cream   Topical Daily   guaiFENesin  1,200 mg Oral BID   Ipratropium-Albuterol  1 puff Inhalation Q6H   mouth rinse  15 mL Mouth Rinse 4 times per day  polyethylene glycol  17 g Oral BID   spironolactone  25 mg Oral Daily   torsemide  40 mg Oral BID   Continuous Infusions:   LOS: 109 days     Tresa Moore, MD [facility]   If 7PM-7AM, please contact night-coverage  03/19/2023, 12:09 PM

## 2023-03-19 NOTE — Plan of Care (Signed)
  Problem: Education: Goal: Knowledge of General Education information will improve Description: Including pain rating scale, medication(s)/side effects and non-pharmacologic comfort measures Outcome: Progressing   Problem: Clinical Measurements: Goal: Respiratory complications will improve Outcome: Progressing   Problem: Clinical Measurements: Goal: Cardiovascular complication will be avoided Outcome: Progressing   Problem: Nutrition: Goal: Adequate nutrition will be maintained Outcome: Progressing   Problem: Elimination: Goal: Will not experience complications related to bowel motility Outcome: Progressing   Problem: Elimination: Goal: Will not experience complications related to urinary retention Outcome: Progressing   Problem: Pain Managment: Goal: General experience of comfort will improve Outcome: Progressing   Problem: Safety: Goal: Ability to remain free from injury will improve Outcome: Progressing

## 2023-03-20 DIAGNOSIS — I5033 Acute on chronic diastolic (congestive) heart failure: Secondary | ICD-10-CM | POA: Diagnosis not present

## 2023-03-20 DIAGNOSIS — J9602 Acute respiratory failure with hypercapnia: Secondary | ICD-10-CM | POA: Diagnosis not present

## 2023-03-20 DIAGNOSIS — J9601 Acute respiratory failure with hypoxia: Secondary | ICD-10-CM | POA: Diagnosis not present

## 2023-03-20 DIAGNOSIS — J441 Chronic obstructive pulmonary disease with (acute) exacerbation: Secondary | ICD-10-CM | POA: Diagnosis not present

## 2023-03-20 DIAGNOSIS — E8770 Fluid overload, unspecified: Secondary | ICD-10-CM | POA: Diagnosis not present

## 2023-03-20 NOTE — Progress Notes (Signed)
Mobility Specialist - Progress Note   03/20/23 1513  Mobility  Activity Transferred from chair to bed  Level of Assistance +2 (takes two people)  Assistive Device  Water quality scientist lift)  Activity Response Tolerated well  $Mobility charge 1 Mobility  Mobility Specialist Start Time (ACUTE ONLY) 1419  Mobility Specialist Stop Time (ACUTE ONLY) 1434  Mobility Specialist Time Calculation (min) (ACUTE ONLY) 15 min   Pt transferred chair to bed via hoyer lift +2 for safety. Pt left in fowler position with needs within reach.  Zetta Bills Mobility Specialist 03/20/23 3:14 PM

## 2023-03-20 NOTE — Progress Notes (Signed)
Patient has refused cpap for the night due to nasal pain.  States he will continue therapy tomorrow night.

## 2023-03-20 NOTE — Progress Notes (Signed)
Physical Therapy Treatment Patient Details Name: Gary [name] MRN: [id number] DOB: 1955-08-28 Today's Date: 03/20/2023   History of Present Illness 67 y/o male presented to [facility] ED on 11/28/22 for chest pain x 4 days. Admitted for acute on chronic CHF. Frequent admissions this year with most recent dsicharge on 11/21/22. PMH includes obesity, hypoxia, respiratory failure with tracheostomy, COPD, GIB, HFpEF.    PT Comments  Pt was pleasant and motivated to participate during the session and put forth fair effort throughout. Pt completed bed mobility with Superv, continuing to self-dictate pace and sequencing. Pt completed transfer with elevated bed height with Superv despite encouragement/education on importance of regaining ability to transfer from standard chair/bed heights. Pt amb with BRW and CGA on 4L O2, limited by report of L knee "giving out" although not observed. Pt's vitals were monitored during session and remained WNL - 2L O2 at rest, 4L with amb. Pt will benefit from continued PT services upon discharge to safely address deficits listed in patient problem list for decreased caregiver assistance and eventual return to PLOF.    If plan is discharge home, recommend the following: A lot of help with bathing/dressing/bathroom;Assist for transportation;A little help with walking and/or transfers   Can travel by private vehicle     No  Equipment Recommendations  Other (comment) (TBD next venue of care)    Recommendations for Other Services       Precautions / Restrictions Precautions Precautions: Fall Restrictions Weight Bearing Restrictions: No     Mobility  Bed Mobility Overal bed mobility: Needs Assistance Bed Mobility: Supine to Sit     Supine to sit: Supervision, HOB elevated     General bed mobility comments: completed slowly, self-directed pace and sequencing    Transfers Overall transfer level: Needs assistance Equipment used:  (BRW) Transfers: Sit to/from  Stand Sit to Stand: Supervision, From elevated surface           General transfer comment: Pt self selected significantly elevated bed height for transfer despite encouragement to work on STS from lower height, completing slowly but steady throughout; quickly descends from standing despite cuing for controlled descent at end of gait    Ambulation/Gait Ambulation/Gait assistance: Contact guard assist Gait Distance (Feet): 50 Feet Assistive device:  (BRW) Gait Pattern/deviations: Step-through pattern, Decreased stride length Gait velocity: decreased     General Gait Details: able to maintain consistent cadence though taking 2 brief standing rest breaks, steady throughout with no LOB/LE buckling despite reported "giving out" of L knee resulting in pt ending gait prematurely - refused to attempt STS from bariatric recliner despite offer to attempt with +2 assist   Stairs             Wheelchair Mobility     Tilt Bed    Modified Rankin (Stroke Patients Only)       Balance Overall balance assessment: Needs assistance Sitting-balance support: No upper extremity supported, Feet supported Sitting balance-[name] Scale: Normal     Standing balance support: Bilateral upper extremity supported, During functional activity Standing balance-[name] Scale: Fair Standing balance comment: Reliant on BRW for all standing activity, no LOBs throughout                            [employer]  Exercises Total Joint Exercises Long Arc Quad: AROM, Both, 5 reps, Seated Other Exercises Other Exercises: educated pt on importance of BLE exercise to increase strength to achieve standard height transfers, encouraged LAQs and marching when seated in recliner    General Comments        Pertinent Vitals/Pain      Home Living                          Prior Function            PT Goals (current goals  can now be found in the care plan section) Acute Rehab PT Goals Patient Stated Goal: Get better so I can get out of here. PT Goal Formulation: With patient Time For Goal Achievement: 03/11/23 Potential to Achieve Goals: Good Progress towards PT goals: Progressing toward goals    Frequency    Min 1X/week      PT Plan      Co-evaluation              AM-PAC PT "6 Clicks" Mobility   Outcome Measure  Help needed turning from your back to your side while in a flat bed without using bedrails?: None Help needed moving from lying on your back to sitting on the side of a flat bed without using bedrails?: A Little Help needed moving to and from a bed to a chair (including a wheelchair)?: A Lot Help needed standing up from a chair using your arms (e.g., wheelchair or bedside chair)?: A Little Help needed to walk in hospital room?: A Little Help needed climbing 3-5 steps with a railing? : Total 6 Click Score: 16    End of Session Equipment Utilized During Treatment: Gait belt;Oxygen (2L at rest, 4L with amb) Activity Tolerance: Patient tolerated treatment well;Patient limited by fatigue Patient left: in chair;with call bell/phone within reach Nurse Communication: Mobility status PT Visit Diagnosis: Muscle weakness (generalized) (M62.81);Difficulty in walking, not elsewhere classified (R26.2)     Time: 1610-9604 PT Time Calculation (min) (ACUTE ONLY): 22 min  Charges:                           Rosiland Oz SPT 03/20/23, 4:19 PM
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
## 2023-03-20 NOTE — Progress Notes (Signed)
PROGRESS NOTE    Gary [name]  NWG:[id number] DOB: 06-20-1955 DOA: 11/28/2022 PCP: Shayne Alken, MD  158A/158A-AA  LOS: 110 days   Brief hospital course:   Assessment & Plan: Gary [name] is a 67 y.o. 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  Patient initially ran out of torsemide and was found to be in CHF exacerbation.  Eventually became euvolemic and transition to p.o. medications.  Patient has been on trach collar status post course of antibiotics for Pseudomonas tracheitis.  Overall stable on 2 L nasal cannula.   Currently awaiting safe disposition. Difficult to place due to tracheostomy and weight  Acute on chronic diastolic CHF (congestive heart failure) ([name]) Initially admitted for volume overloaded.  Now appears to be euvolemic.   Currently patient is on Aldactone, torsemide. Monitor volume status closely   Atypical chest pain; resolved.  -EKG and trops are neg.    Acute on chronic respiratory failure with hypoxia and hypercapnia ([name]) Trach Dependent Pseudomonas tracheitis On trach collar. S/p abx course for pseudomonas tracheitis Also evaluated for non-massive hemoptysis with CT soft tissue of neck done on 02/08/2023 .  Tracheostomy tube is in place and there is no definite collection around the tube.  There is thinning of the wall between the esophagus and trachea with possible defect. Currently stable on 2 L nasal cannula   Severe COPD (chronic obstructive pulmonary disease) ([name]) on Combivent, as needed albuterol.  On yuperli nebulizer.   Acute kidney injury superimposed on CKD ([name]) Creatinine stable   Anxiety On Atarax as needed.  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    Chronic colitis Stable Continue home mesalamine   Generalized weakness Patient has been more motivated and working with physical therapy and mobility specialist, however, resistant to certain important exercises such as getting up from seated position to standing. --PT/OT     Morbid obesity with BMI of 50.0-59.9, adult ([name]) Complicating factor to overall care and prognosis.  Registered dietitian reengaged on 12/2.  Spent 30 minutes discussing dietary intake with patient.  Patient is agreeable and is tolerating a low-sodium 2 g diet   Constipation Lactulose as needed    DVT prophylaxis: VH:QIONGEX Code Status: Full code  Family Communication:  Level of care: Med-Surg Dispo:   The patient is from: home Anticipated d/c is to: to be determined Anticipated d/c date is: to be determined   Subjective and Interval History:  Pt said he felt weaker because he wasn't walked in 3 days.    Multiple PT reported pt refuses to do exercises/training that he doesn't want to, such as getting from seated position to standing.  Pt kept saying he doesn't want to go home too soon because he will just come right back, this is after being in the hospital for 112 days.   Objective: Vitals:   03/19/23 0732 03/20/23 0500 03/20/23 0849 03/20/23 1619  BP: 99/76  115/70 111/71  Pulse: 81  82 84  Resp: 18  19 19   Temp: 98.3 F (36.8 C)   98.6 F (37 C)  TempSrc: Oral     SpO2: 99%  100% 94%  Weight:  (!) 195 kg    Height:  Intake/Output Summary (Last 24 hours) at 03/20/2023 2033 Last data filed at 03/20/2023 1955 Gross per 24 hour  Intake 1071 ml  Output 1800 ml  Net -729 ml   Filed Weights   03/14/23 0500 03/19/23 0500 03/20/23 0500  Weight: (!) 191.5 kg (!) 193.8 kg (!) 195 kg    Examination:   Constitutional: NAD, AAOx3 HEENT: conjunctivae and lids normal, EOMI CV: No cyanosis.   RESP: normal respiratory effort, on trach Neuro: II - XII grossly intact.     Data Reviewed:  I have personally reviewed labs and imaging studies  Time spent: 35 minutes  Darlin Priestly, MD [facility] If 7PM-7AM, please contact night-coverage 03/20/2023, 8:33 PM

## 2023-03-21 DIAGNOSIS — I5033 Acute on chronic diastolic (congestive) heart failure: Secondary | ICD-10-CM | POA: Diagnosis not present

## 2023-03-21 DIAGNOSIS — E8770 Fluid overload, unspecified: Secondary | ICD-10-CM | POA: Diagnosis not present

## 2023-03-21 DIAGNOSIS — J9601 Acute respiratory failure with hypoxia: Secondary | ICD-10-CM | POA: Diagnosis not present

## 2023-03-21 DIAGNOSIS — J441 Chronic obstructive pulmonary disease with (acute) exacerbation: Secondary | ICD-10-CM | POA: Diagnosis not present

## 2023-03-21 DIAGNOSIS — J9602 Acute respiratory failure with hypercapnia: Secondary | ICD-10-CM | POA: Diagnosis not present

## 2023-03-21 NOTE — TOC Progression Note (Signed)
Transition of Care [facility]) - Progression Note    Patient Details  Name: Gary [name] MRN: [id number] Date of Birth: 1955-05-31  [facility]) CM/SW Contact  Marlowe Sax, RN Phone Number: 03/21/2023, 10:38 AM  Clinical Narrative:     Called medical equipment of [place] to check the cost of a rental for a bariatric recliner lift chair, they do not have one that goes above 375 lbs, called [employer] to check on it and they will check and let me know  Expected Discharge Plan: Home w Home Health Services Barriers to Discharge: Continued Medical Work up  Expected Discharge Plan and Services     Post Acute Care Choice: Home Health   Expected Discharge Date: 02/26/23               DME Arranged: Bedside commode DME Agency: [employer] Date DME Agency Contacted: 01/12/23 Time DME Agency Contacted: 1427 Representative spoke with at DME Agency: [facility] HH Arranged: PT, OT HH Agency: [facility] Date [facility] Agency Contacted: 01/12/23 Time HH Agency Contacted: 1428 Representative spoke with at [facility] Agency: Kandee Keen   Social Determinants of Health (SDOH) Interventions SDOH Screenings   Food Insecurity: No Food Insecurity (12/01/2022)  Housing: Patient Declined (12/01/2022)  Transportation Needs: No Transportation Needs (12/01/2022)  Utilities: Not At Risk (12/01/2022)  Depression (PHQ2-9): Low Risk  (06/07/2021)  Financial Resource Strain: High Risk (11/21/2022)   Received from [facility] Care  Physical Activity: Insufficiently Active (05/03/2017)  Social Connections: Patient Declined (11/20/2022)   Received from [facility]  Stress: Stress Concern Present (11/20/2022)   Received from [facility]  Tobacco Use: Medium Risk (02/13/2023)    Readmission Risk Interventions    03/19/2023    9:58 AM 11/05/2022    2:59 PM 11/05/2022   12:16 PM  Readmission Risk Prevention Plan  Transportation Screening Complete Complete Complete  Medication Review [employer]) Complete Complete  Complete  PCP or Specialist appointment within 3-5 days of discharge Complete Complete Complete  HRI or Home Care Consult Complete Complete Complete  SW Recovery Care/Counseling Consult Complete Complete Complete  Palliative Care Screening Complete Complete Complete  Skilled Nursing Facility Not Applicable Not Applicable Not Applicable

## 2023-03-21 NOTE — Progress Notes (Signed)
Mobility Specialist - Progress Note   03/21/23 1053  Mobility  Activity Stood at bedside (therex: standing marches)  Level of Assistance Standby assist, set-up cues, supervision of patient - no hands on  Assistive Device Front wheel walker  Range of Motion/Exercises Active;Right leg;Left leg  Activity Response Tolerated well  Mobility visit 1 Mobility  Mobility Specialist Start Time (ACUTE ONLY) [id number]  Mobility Specialist Stop Time (ACUTE ONLY) 1012  Mobility Specialist Time Calculation (min) (ACUTE ONLY) 34 min   Pt supine upon entry, utilizing RA. Pt agreeable to bedside therex this date. Pt completed bed mob ModI with heavy use of bed features. STS to RW and completed standing marches with supervision for 1 min before returning EOB d/t fatigue. Pt STS to RW with intentions to complete another set of standing marches, however d/t feeling of BM Pt returned supine and placed on bedpan. Pt left side lying with alarm set and needs within reach.  Zetta Bills Mobility Specialist 03/21/23 11:28 AM

## 2023-03-21 NOTE — Plan of Care (Signed)
  Problem: Education: Goal: Knowledge of General Education information will improve Description: Including pain rating scale, medication(s)/side effects and non-pharmacologic comfort measures Outcome: Progressing   Problem: Health Behavior/Discharge Planning: Goal: Ability to manage health-related needs will improve Outcome: Progressing   Problem: Clinical Measurements: Goal: Ability to maintain clinical measurements within normal limits will improve Outcome: Progressing Goal: Will remain free from infection Outcome: Progressing Goal: Diagnostic test results will improve Outcome: Progressing   Problem: Activity: Goal: Risk for activity intolerance will decrease Outcome: Progressing   Problem: Coping: Goal: Level of anxiety will decrease Outcome: Progressing   Problem: Pain Managment: Goal: General experience of comfort will improve Outcome: Progressing
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
## 2023-03-21 NOTE — Progress Notes (Signed)
PROGRESS NOTE    [name]  UJW:[id number] DOB: 1955/10/22 DOA: 11/28/2022 PCP: Shayne Alken, MD  122A/122A-BB  LOS: 111 days   Brief hospital course:   Assessment & Plan: Gary [name] is a 67 y.o. 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  Patient initially ran out of torsemide and was found to be in CHF exacerbation.  Eventually became euvolemic and transition to p.o. medications.  Patient has been on trach collar status post course of antibiotics for Pseudomonas tracheitis.  Overall stable on 2 L nasal cannula.   Currently awaiting safe disposition. Difficult to place due to tracheostomy and weight  Acute on chronic diastolic CHF (congestive heart failure) ([name]) Initially admitted for volume overloaded.  Now appears to be euvolemic.   Currently patient is on Aldactone, torsemide. Monitor volume status closely   Atypical chest pain; resolved.  -EKG and trops are neg.    Acute on chronic respiratory failure with hypoxia and hypercapnia ([name]) Trach Dependent Pseudomonas tracheitis On trach collar. S/p abx course for pseudomonas tracheitis Also evaluated for non-massive hemoptysis with CT soft tissue of neck done on 02/08/2023 .  Tracheostomy tube is in place and there is no definite collection around the tube.  There is thinning of the wall between the esophagus and trachea with possible defect. Currently stable on 2 L nasal cannula   Severe COPD (chronic obstructive pulmonary disease) ([name]) on Combivent, as needed albuterol.  On yuperli nebulizer.   Acute kidney injury superimposed on CKD ([name]) Creatinine stable   Anxiety On Atarax as needed.  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    Chronic colitis Stable Continue home mesalamine   Generalized weakness Patient has been more motivated and working with physical therapy and mobility specialist, however, resistant to certain important exercises such as getting up from seated position to standing. --PT/OT     Morbid obesity with BMI of 50.0-59.9, adult ([name]) Complicating factor to overall care and prognosis.  Registered dietitian reengaged on 12/2.  Spent 30 minutes discussing dietary intake with patient.  Patient is agreeable and is tolerating a low-sodium 2 g diet   Constipation Lactulose as needed    DVT prophylaxis: FA:[id number] Code Status: Full code  Family Communication:  Level of care: Med-Surg Dispo:   The patient is from: home Anticipated d/c is to: to be determined Anticipated d/c date is: to be determined   Subjective and Interval History:  Pt said he would work on getting up from seated position if we provide him with recliner with higher seating surface  Moved from 1A to 1C today.   Objective: Vitals:   03/20/23 2217 03/21/23 0500 03/21/23 0835 03/21/23 1747  BP: 102/68  99/64 113/73  Pulse: 86  82 83  Resp: 16  18 20   Temp: 98.2 F (36.8 C)  97.9 F (36.6 C) 97.7 F (36.5 C)  TempSrc: Oral     SpO2: 95%  95% 95%  Weight:  (!) 195.4 kg    Height:        Intake/Output Summary (Last 24 hours) at 03/21/2023 2032 Last data filed at 03/21/2023 7846 Gross per 24 hour  Intake --  Output 1250 ml  Net -1250 ml   [employer]  03/19/23 0500 03/20/23 0500 03/21/23 0500  Weight: (!) 193.8 kg (!) 195 kg (!) 195.4 kg    Examination:   Constitutional: NAD, AAOx3 HEENT: conjunctivae and lids normal, EOMI CV: No cyanosis.   RESP: on trach Neuro: II - XII grossly intact.     Data Reviewed: I have personally reviewed labs and imaging studies  Time spent: 25 minutes  Darlin Priestly, MD [facility] If 7PM-7AM, please contact night-coverage 03/21/2023, 8:32 PM

## 2023-03-21 NOTE — Progress Notes (Signed)
OT Cancellation Note  Patient Details Name: [name] MRN: [id number] DOB: [birth year]   Cancelled Treatment:    Reason Eval/Treat Not Completed: Patient declined, no reason specified. Attempted x3, pt changing rooms and requesting to wait until in new room. After arrival to new room pt reports fatigue. Requests to re-attempt next date.   Kathie Dike, M.S. OTR/L  03/21/23, 4:08 PM  ascom [phone]

## 2023-03-22 DIAGNOSIS — J441 Chronic obstructive pulmonary disease with (acute) exacerbation: Secondary | ICD-10-CM | POA: Diagnosis not present

## 2023-03-22 DIAGNOSIS — I5033 Acute on chronic diastolic (congestive) heart failure: Secondary | ICD-10-CM | POA: Diagnosis not present

## 2023-03-22 DIAGNOSIS — E8770 Fluid overload, unspecified: Secondary | ICD-10-CM | POA: Diagnosis not present

## 2023-03-22 DIAGNOSIS — J9602 Acute respiratory failure with hypercapnia: Secondary | ICD-10-CM | POA: Diagnosis not present

## 2023-03-22 DIAGNOSIS — J9601 Acute respiratory failure with hypoxia: Secondary | ICD-10-CM | POA: Diagnosis not present

## 2023-03-22 NOTE — Progress Notes (Signed)
Occupational Therapy Treatment Patient Details Name: Gary [name] MRN: [id number] DOB: 11-28-1955 Today's Date: 03/22/2023   History of present illness 67 y/o male presented to [facility] ED on 11/28/22 for chest pain x 4 days. Admitted for acute on chronic CHF. Frequent admissions this year with most recent dsicharge on 11/21/22. PMH includes obesity, hypoxia, respiratory failure with tracheostomy, COPD, GIB, HFpEF.   OT comments  Gary [name] was seen for OT treatment on this date. Upon arrival to room pt in bed, agreeable to tx. Pt requires SUPERVISION rolling L+R in bed and sup>sit. SUP + RW standing from max elevated bed height and tolerates ~60 ft mobility. Pt requesting to return to bed, use of makisky lift for safety as pt unwilling to trial standing from low chair height. Pt making progress toward goals, will continue to follow POC. Discharge recommendation remains appropriate.        If plan is discharge home, recommend the following:  A little help with walking and/or transfers;A lot of help with bathing/dressing/bathroom   Equipment Recommendations  BSC/3in1    Recommendations for Other Services      Precautions / Restrictions Precautions Precautions: Fall Restrictions Weight Bearing Restrictions: No       Mobility Bed Mobility Overal bed mobility: Needs Assistance Bed Mobility: Rolling, Supine to Sit Rolling: Supervision   Supine to sit: Supervision, HOB elevated          Transfers Overall transfer level: Needs assistance Equipment used: Rolling walker (2 wheels) Transfers: Sit to/from Stand Sit to Stand: Supervision, From elevated surface                 Balance Overall balance assessment: Needs assistance Sitting-balance support: No upper extremity supported, Feet supported Sitting balance-[name] Scale: Normal     Standing balance support: Bilateral upper extremity supported, During functional activity Standing balance-[name] Scale: Fair                              ADL either performed or assessed with clinical judgement   ADL Overall ADL's : Needs assistance/impaired                                       General ADL Comments: MAX A don B shoes in sitting      Cognition Arousal: Alert Behavior During Therapy: WFL for tasks assessed/performed Overall Cognitive Status: Within Functional Limits for tasks assessed                                                     Pertinent Vitals/ Pain       Pain Assessment Pain Assessment: No/denies pain   Frequency  Min 1X/week        Progress Toward Goals  OT Goals(current goals can now be found in the care plan section)  Progress towards OT goals: Progressing toward goals  Acute Rehab OT Goals OT Goal Formulation: With patient Time For Goal Achievement: 03/30/23 Potential to Achieve Goals: Good ADL Goals Pt Will Perform Grooming: with modified independence;standing Pt Will Transfer to Toilet: with modified independence;ambulating;regular height toilet Pt Will Perform Toileting - Clothing Manipulation and hygiene: with mod assist;sit to/from stand Additional ADL Goal #1: Pt will stand  from the hospital bed or recliner adn LR DME with min A as a precursor to ADLs  Plan Discharge plan remains appropriate;Frequency remains appropriate    Co-evaluation                 AM-PAC OT "6 Clicks" Daily Activity     Outcome Measure   Help from another person eating meals?: None Help from another person taking care of personal grooming?: A Little Help from another person toileting, which includes using toliet, bedpan, or urinal?: A Lot Help from another person bathing (including washing, rinsing, drying)?: A Lot Help from another person to put on and taking off regular upper body clothing?: None Help from another person to put on and taking off regular lower body clothing?: A Lot 6 Click Score: 17    End of Session Equipment  Utilized During Treatment: Rolling walker (2 wheels);Oxygen  OT Visit Diagnosis: Unsteadiness on feet (R26.81);Other abnormalities of gait and mobility (R26.89);Muscle weakness (generalized) (M62.81);Adult, failure to thrive (R62.7)   Activity Tolerance Patient tolerated treatment well   Patient Left in bed;with call bell/phone within reach   Nurse Communication Mobility status        Time: 0932-3557 OT Time Calculation (min): 29 min  Charges: OT General Charges $OT Visit: 1 Visit OT Treatments $Self Care/Home Management : 8-22 mins $Therapeutic Activity: 8-22 mins  Kathie Dike, M.S. OTR/L  03/22/23, 1:51 PM  ascom [phone]

## 2023-03-22 NOTE — Plan of Care (Signed)
Pt alert and oriented, c/o chronic generalized body pain and receives oxycodone 5mg  PO prn. Pt has trach and performs trach care independently. Pt on 5L/[place] chronic. Pt had large bowel movement today. Telesitter at bedside. Pt up with FWW with PT today.   Problem: Education: Goal: Knowledge of General Education information will improve Description: Including pain rating scale, medication(s)/side effects and non-pharmacologic comfort measures Outcome: Progressing   Problem: Health Behavior/Discharge Planning: Goal: Ability to manage health-related needs will improve Outcome: Progressing   Problem: Clinical Measurements: Goal: Ability to maintain clinical measurements within normal limits will improve Outcome: Progressing Goal: Will remain free from infection Outcome: Progressing Goal: Diagnostic test results will improve Outcome: Progressing Goal: Respiratory complications will improve Outcome: Progressing Goal: Cardiovascular complication will be avoided Outcome: Progressing   Problem: Activity: Goal: Risk for activity intolerance will decrease Outcome: Progressing   Problem: Nutrition: Goal: Adequate nutrition will be maintained Outcome: Progressing   Problem: Coping: Goal: Level of anxiety will decrease Outcome: Progressing   Problem: Elimination: Goal: Will not experience complications related to bowel motility Outcome: Progressing Goal: Will not experience complications related to urinary retention Outcome: Progressing   Problem: Pain Managment: Goal: General experience of comfort will improve Outcome: Progressing   Problem: Safety: Goal: Ability to remain free from injury will improve Outcome: Progressing   Problem: Skin Integrity: Goal: Risk for impaired skin integrity will decrease Outcome: Progressing

## 2023-03-22 NOTE — Plan of Care (Signed)
  Problem: Education: Goal: Knowledge of General Education information will improve Description: Including pain rating scale, medication(s)/side effects and non-pharmacologic comfort measures Outcome: Progressing   Problem: Clinical Measurements: Goal: Ability to maintain clinical measurements within normal limits will improve Outcome: Progressing   Problem: Activity: Goal: Risk for activity intolerance will decrease Outcome: Progressing   Problem: Nutrition: Goal: Adequate nutrition will be maintained Outcome: Progressing   Problem: Coping: Goal: Level of anxiety will decrease Outcome: Progressing   Problem: Elimination: Goal: Will not experience complications related to bowel motility Outcome: Progressing   Problem: Pain Managment: Goal: General experience of comfort will improve Outcome: Progressing   Problem: Safety: Goal: Ability to remain free from injury will improve Outcome: Progressing   Problem: Skin Integrity: Goal: Risk for impaired skin integrity will decrease Outcome: Progressing   

## 2023-03-22 NOTE — Progress Notes (Signed)
PROGRESS NOTE    Gary [name]  OZH:[id number] DOB: 11-30-1955 DOA: 11/28/2022 PCP: Shayne Alken, MD  [id number]  LOS: 112 days   Brief hospital course:   Assessment & Plan: Gary [name] is a 67 y.o. 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  Patient initially ran out of torsemide and was found to be in CHF exacerbation.  Eventually became euvolemic and transition to p.o. medications.  Patient has been on trach collar status post course of antibiotics for Pseudomonas tracheitis.  Overall stable on 2 L nasal cannula.   Currently awaiting safe disposition. Difficult to place due to tracheostomy and weight  Acute on chronic diastolic CHF (congestive heart failure) ([name]) Initially admitted for volume overloaded.  Now appears to be euvolemic.   Currently patient is on Aldactone, torsemide. Monitor volume status closely   Atypical chest pain; resolved.  -EKG and trops are neg.    Acute on chronic respiratory failure with hypoxia and hypercapnia ([name]) Trach Dependent Pseudomonas tracheitis On trach collar. S/p abx course for pseudomonas tracheitis Also evaluated for non-massive hemoptysis with CT soft tissue of neck done on 02/08/2023 .  Tracheostomy tube is in place and there is no definite collection around the tube.  There is thinning of the wall between the esophagus and trachea with possible defect. Currently stable on 2 L nasal cannula   Severe COPD (chronic obstructive pulmonary disease) ([name]) on Combivent, as needed albuterol.  On yuperli nebulizer.   Acute kidney injury superimposed on CKD ([name]) Creatinine stable   Anxiety On Atarax as needed.  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    Chronic colitis Stable Continue home mesalamine   Generalized weakness Patient has been more motivated and working with physical therapy and mobility specialist, however, resistant to certain important exercises such as getting up from seated position to standing. --PT/OT     Morbid obesity with BMI of 50.0-59.9, adult ([name]) Complicating factor to overall care and prognosis.  Registered dietitian reengaged on 12/2.  Spent 30 minutes discussing dietary intake with patient.  Patient is agreeable and is tolerating a low-sodium 2 g diet   Constipation Lactulose as needed    DVT prophylaxis: [id number] Code Status: Full code  Family Communication:  Level of care: Med-Surg Dispo:   The patient is from: home Anticipated d/c is to: to be determined Anticipated d/c date is: to be determined   Subjective and Interval History:  No new complaint today.     Objective: Vitals:   03/22/23 0413 03/22/23 0800 03/22/23 1611 03/22/23 1803  BP: 108/74 105/70 101/70 95/60  Pulse: 86 90 86 85  Resp: 18  18   Temp: 98.4 F (36.9 C) 98.5 F (36.9 C) 97.7 F (36.5 C)   TempSrc:  Oral Oral   SpO2: 96% 96% 91%   Weight:      Height:        Intake/Output Summary (Last 24 hours) at 03/22/2023 1917 Last data filed at 03/22/2023 1700 Gross per 24 hour  Intake 240 ml  Output 2300 ml  Net -2060 ml   Filed Weights   03/19/23 0500 03/20/23 0500 03/21/23 0500  Weight: (!) 193.8 kg (!) 195 kg (!) 195.4 kg    Examination:  Constitutional: NAD, AAOx3 HEENT: conjunctivae and lids normal, EOMI CV: No cyanosis.   RESP: on trach SKIN: warm, dry Neuro: II - XII grossly intact.     Data Reviewed: I have personally reviewed labs and imaging studies  Time spent: 25 minutes  Darlin Priestly, MD [facility] If 7PM-7AM, please contact night-coverage 03/22/2023, 7:17 PM

## 2023-03-22 NOTE — Plan of Care (Signed)
  Problem: Education: Goal: Knowledge of General Education information will improve Description Including pain rating scale, medication(s)/side effects and non-pharmacologic comfort measures Outcome: Progressing   Problem: Health Behavior/Discharge Planning: Goal: Ability to manage health-related needs will improve Outcome: Progressing   Problem: Clinical Measurements: Goal: Ability to maintain clinical measurements within normal limits will improve Outcome: Progressing   Problem: Activity: Goal: Risk for activity intolerance will decrease Outcome: Progressing   Problem: Nutrition: Goal: Adequate nutrition will be maintained Outcome: Progressing   Problem: Coping: Goal: Level of anxiety will decrease Outcome: Progressing   Problem: Pain Managment: Goal: General experience of comfort will improve Outcome: Progressing   Problem: Safety: Goal: Ability to remain free from injury will improve Outcome: Progressing   Problem: Skin Integrity: Goal: Risk for impaired skin integrity will decrease Outcome: Progressing   
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
## 2023-03-23 DIAGNOSIS — I5033 Acute on chronic diastolic (congestive) heart failure: Secondary | ICD-10-CM | POA: Diagnosis not present

## 2023-03-23 NOTE — Progress Notes (Signed)
PROGRESS NOTE    Gary [name]  UVO:[id number] DOB: July 28, 1955 DOA: 11/28/2022 PCP: Shayne Alken, MD  122A/122A-BB  LOS: 113 days   Brief hospital course:   Assessment & Plan: Gary [name] is a 67 y.o. 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  Patient initially ran out of torsemide and was found to be in CHF exacerbation.  Eventually became euvolemic and transition to p.o. medications.  Patient has been on trach collar status post course of antibiotics for Pseudomonas tracheitis.  Overall stable on 2 L nasal cannula.   Currently awaiting safe disposition. Difficult to place due to tracheostomy and weight  Acute on chronic diastolic CHF (congestive heart failure) ([name]) Initially admitted for volume overloaded.  Now appears to be euvolemic.   Currently patient is on Aldactone, torsemide. Monitor volume status closely   Atypical chest pain; resolved.  -EKG and trops are neg.    Acute on chronic respiratory failure with hypoxia and hypercapnia ([name]) Trach Dependent Pseudomonas tracheitis On trach collar. S/p abx course for pseudomonas tracheitis Also evaluated for non-massive hemoptysis with CT soft tissue of neck done on 02/08/2023 .  Tracheostomy tube is in place and there is no definite collection around the tube.  There is thinning of the wall between the esophagus and trachea with possible defect. Currently stable on 2 L nasal cannula   Severe COPD (chronic obstructive pulmonary disease) ([name]) on Combivent, as needed albuterol.  On yuperli nebulizer.   Acute kidney injury superimposed on CKD ([name]) Creatinine stable   Anxiety On Atarax as needed.  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    Chronic colitis Stable Continue home mesalamine   Generalized weakness Patient has been more motivated and working with physical therapy and mobility specialist, however, resistant to certain important exercises such as getting up from seated position to standing. --PT/OT     Morbid obesity with BMI of 50.0-59.9, adult ([name]) Complicating factor to overall care and prognosis.  Registered dietitian reengaged on 12/2.  Spent 30 minutes discussing dietary intake with patient.  Patient is agreeable and is tolerating a low-sodium 2 g diet   Constipation Lactulose as needed    DVT prophylaxis: [id number] Code Status: Full code  Family Communication:  Level of care: Med-Surg Dispo:   The patient is from: home Anticipated d/c is to: to be determined Anticipated d/c date is: to be determined   Subjective and Interval History:  Pt requested walk today.   Objective: Vitals:   03/23/23 0515 03/23/23 0757 03/23/23 1535 03/23/23 1809  BP: 115/75 (!) 97/59 113/68 102/68  Pulse: 80 81 84 87  Resp: 18 20 18    Temp: (!) 97.4 F (36.3 C) 97.8 F (36.6 C) 98.4 F (36.9 C)   TempSrc:      SpO2: 90% 96% 98%   Weight:      Height:        Intake/Output Summary (Last 24 hours) at 03/23/2023 1903 Last data filed at 03/23/2023 1000 Gross per 24 hour  Intake --  Output 1750 ml  Net -1750 ml   Filed Weights   03/19/23 0500 03/20/23 0500 03/21/23 0500  Weight: (!) 193.8 kg (!) 195 kg (!) 195.4 kg    Examination:  Constitutional: NAD, AAOx3 HEENT: conjunctivae and lids normal, EOMI CV: No cyanosis.   RESP: trach present Neuro: II - XII grossly intact.     Data Reviewed: I have personally reviewed labs and imaging studies  Time spent: 25 minutes  Darlin Priestly, MD [facility] If 7PM-7AM, please contact night-coverage 03/23/2023, 7:03 PM

## 2023-03-24 DIAGNOSIS — I5033 Acute on chronic diastolic (congestive) heart failure: Secondary | ICD-10-CM | POA: Diagnosis not present

## 2023-03-24 NOTE — Progress Notes (Signed)
PROGRESS NOTE    Gary [name]  ZOX:[id number] DOB: 07-26-1955 DOA: 11/28/2022 PCP: Shayne Alken, MD  122A/122A-BB  LOS: 114 days   Brief hospital course:   Assessment & Plan: Gary [name] is a 67 y.o. 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  Patient initially ran out of torsemide and was found to be in CHF exacerbation.  Eventually became euvolemic and transition to p.o. medications.  Patient has been on trach collar status post course of antibiotics for Pseudomonas tracheitis.  Overall stable on 2 L nasal cannula.   Currently awaiting safe disposition. Difficult to place due to tracheostomy and weight  Acute on chronic diastolic CHF (congestive heart failure) ([name]) Initially admitted for volume overloaded.  Now appears to be euvolemic.   Currently patient is on Aldactone, torsemide. Monitor volume status closely   Atypical chest pain; resolved.  -EKG and trops are neg.    Acute on chronic respiratory failure with hypoxia and hypercapnia ([name]) Trach Dependent Pseudomonas tracheitis On trach collar. S/p abx course for pseudomonas tracheitis Also evaluated for non-massive hemoptysis with CT soft tissue of neck done on 02/08/2023 .  Tracheostomy tube is in place and there is no definite collection around the tube.  There is thinning of the wall between the esophagus and trachea with possible defect. Currently stable on 2 L nasal cannula   Severe COPD (chronic obstructive pulmonary disease) ([name]) on Combivent, as needed albuterol.  On yuperli nebulizer.   Acute kidney injury superimposed on CKD ([name]) Creatinine stable   Anxiety On Atarax as needed.  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    Chronic colitis Stable Continue home mesalamine   Generalized weakness Patient has been more motivated and working with physical therapy and mobility specialist, however, resistant to certain important exercises such as getting up from seated position to standing. --PT/OT     Morbid obesity with BMI of 50.0-59.9, adult ([name]) Complicating factor to overall care and prognosis.  Registered dietitian reengaged on 12/2.  Spent 30 minutes discussing dietary intake with patient.  Patient is agreeable and is tolerating a low-sodium 2 g diet   Constipation Lactulose as needed    DVT prophylaxis: [id number] Code Status: Full code  Family Communication:  Level of care: Med-Surg Dispo:   The patient is from: home Anticipated d/c is to: to be determined Anticipated d/c date is: to be determined   Subjective and Interval History:  No new event today   Objective: Vitals:   03/24/23 0502 03/24/23 0754 03/24/23 0839 03/24/23 1750  BP: 106/74  111/78 106/64  Pulse: 82  84 88  Resp: 20  (!) 22 20  Temp: 97.7 F (36.5 C)  98 F (36.7 C) 98 F (36.7 C)  TempSrc: Oral  Oral Oral  SpO2: 94% 94% 94% 96%  Weight:      Height:        Intake/Output Summary (Last 24 hours) at 03/24/2023 1851 Last data filed at 03/24/2023 1757 Gross per 24 hour  Intake 240 ml  Output 3700 ml  Net -3460 ml   Filed Weights   03/20/23 0500 03/21/23 0500 03/24/23 0500  Weight: (!) 195 kg (!) 195.4 kg (!) 193.4 kg    Examination:  Constitutional: NAD, AAOx3 HEENT: conjunctivae and lids normal, EOMI CV: No cyanosis.   RESP: trach present Neuro: II - XII grossly intact.     Data Reviewed: I have personally reviewed labs and imaging studies  Time spent: 25 minutes  Darlin Priestly, MD [facility] If 7PM-7AM, please contact night-coverage 03/24/2023, 6:51 PM

## 2023-03-24 NOTE — Plan of Care (Addendum)
Patient is alert and oriented, he complained pain and got pain medicine as order.Telesitter at bedside.  Problem: Education: Goal: Knowledge of General Education information will improve Description: Including pain rating scale, medication(s)/side effects and non-pharmacologic comfort measures Outcome: Progressing   Problem: Health Behavior/Discharge Planning: Goal: Ability to manage health-related needs will improve Outcome: Progressing   Problem: Clinical Measurements: Goal: Ability to maintain clinical measurements within normal limits will improve Outcome: Progressing Goal: Will remain free from infection Outcome: Progressing Goal: Diagnostic test results will improve Outcome: Progressing Goal: Respiratory complications will improve Outcome: Progressing Goal: Cardiovascular complication will be avoided Outcome: Progressing   Problem: Activity: Goal: Risk for activity intolerance will decrease Outcome: Progressing   Problem: Nutrition: Goal: Adequate nutrition will be maintained Outcome: Progressing   Problem: Coping: Goal: Level of anxiety will decrease Outcome: Progressing   Problem: Elimination: Goal: Will not experience complications related to bowel motility Outcome: Progressing Goal: Will not experience complications related to urinary retention Outcome: Progressing   Problem: Pain Managment: Goal: General experience of comfort will improve Outcome: Progressing   Problem: Safety: Goal: Ability to remain free from injury will improve Outcome: Progressing   Problem: Skin Integrity: Goal: Risk for impaired skin integrity will decrease Outcome: Progressing

## 2023-03-25 DIAGNOSIS — I5033 Acute on chronic diastolic (congestive) heart failure: Secondary | ICD-10-CM | POA: Diagnosis not present

## 2023-03-25 LAB — CBC
HCT: 38.4 % — ABNORMAL LOW (ref 39.0–52.0)
Hemoglobin: 10.6 g/dL — ABNORMAL LOW (ref 13.0–17.0)
MCH: 21.2 pg — ABNORMAL LOW (ref 26.0–34.0)
MCHC: 27.6 g/dL — ABNORMAL LOW (ref 30.0–36.0)
MCV: 76.8 fL — ABNORMAL LOW (ref 80.0–100.0)
Platelets: 239 10*3/uL (ref 150–400)
RBC: 5 MIL/uL (ref 4.22–5.81)
RDW: 18.6 % — ABNORMAL HIGH (ref 11.5–15.5)
WBC: 7.6 10*3/uL (ref 4.0–10.5)
nRBC: 0.9 % — ABNORMAL HIGH (ref 0.0–0.2)

## 2023-03-25 LAB — BASIC METABOLIC PANEL
Anion gap: 8 (ref 5–15)
BUN: 34 mg/dL — ABNORMAL HIGH (ref 8–23)
CO2: 31 mmol/L (ref 22–32)
Calcium: 8.2 mg/dL — ABNORMAL LOW (ref 8.9–10.3)
Chloride: 96 mmol/L — ABNORMAL LOW (ref 98–111)
Creatinine, Ser: 1.47 mg/dL — ABNORMAL HIGH (ref 0.61–1.24)
GFR, Estimated: 53 mL/min — ABNORMAL LOW (ref >60)
Glucose, Bld: 143 mg/dL — ABNORMAL HIGH (ref 70–99)
Potassium: 3.8 mmol/L (ref 3.5–5.1)
Sodium: 135 mmol/L (ref 135–145)

## 2023-03-25 LAB — MAGNESIUM: Magnesium: 2.4 mg/dL (ref 1.7–2.4)

## 2023-03-25 NOTE — Progress Notes (Signed)
Unable to draw timed labs (BMP, CBC, and Mag) at midnight per phlebotomy. Pt refusing to wear armband on person. Per policy unable to draw without armband per lab. Charge nurse made aware to escalate to leadership for resolution.  On call provider,  Lorraine Lax, NP made aware.

## 2023-03-25 NOTE — Progress Notes (Addendum)
Mobility Specialist - Progress Note   03/25/23 1024  Mobility  Activity Ambulated with assistance in hallway  Level of Assistance Standby assist, set-up cues, supervision of patient - no hands on  Assistive Device Front wheel walker (chair follow)  Distance Ambulated (ft) 30 ft  Activity Response Tolerated well  Mobility visit 1 Mobility  Mobility Specialist Start Time (ACUTE ONLY) [id number]  Mobility Specialist Stop Time (ACUTE ONLY) 1017  Mobility Specialist Time Calculation (min) (ACUTE ONLY) 26 min   Pt supine upon entry, utilizing RA. Pt agreeable to OOB amb this date, expressed stiffness of the LLE. Pt placed on 4L [place] prior to amb. Pt completed bed mob modI, STS to RW from elevated bed height and amb 30 ft in the hallway SBA with chair follow. Pt required a seated rest break d/t BUE weakness from bearing weight on the RW. Pt wheeled into the room, transferred bed to chair via hoyer lift. Pt left in fowler position with alarm set and needs within reach.  Zetta Bills Mobility Specialist 03/25/23 10:55 AM

## 2023-03-25 NOTE — Progress Notes (Signed)
PT Cancellation Note  Patient Details Name: Gary [name] MRN: [id number] DOB: 12-Sep-1955   Cancelled Treatment:    Reason Eval/Treat Not Completed: Patient declined, no reason specified (Treatment session attempted.  Patient already returned to bed after AM walk/OOB; declines participation with sesion this PM stating it's "too late".  Requests session next date at 1300.  Will re-attempt at as appropriate.)  [name] H. Manson Passey, PT, DPT, NCS 03/25/23, 2:22 PM [phone]

## 2023-03-25 NOTE — Plan of Care (Signed)
  Problem: Education: Goal: Knowledge of General Education information will improve Description: Including pain rating scale, medication(s)/side effects and non-pharmacologic comfort measures Outcome: Progressing   Problem: Clinical Measurements: Goal: Ability to maintain clinical measurements within normal limits will improve Outcome: Progressing   

## 2023-03-25 NOTE — Progress Notes (Signed)
PROGRESS NOTE    Gary [name]  BJY:[id number] DOB: March 31, 1956 DOA: 11/28/2022 PCP: Shayne Alken, MD  122A/122A-BB  LOS: 115 days   Brief hospital course:   Assessment & Plan: Gary [name] is a 67 y.o. 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  Patient initially ran out of torsemide and was found to be in CHF exacerbation.  Eventually became euvolemic and transition to p.o. medications.  Patient has been on trach collar status post course of antibiotics for Pseudomonas tracheitis.  Overall stable on 2 L nasal cannula.   Currently awaiting safe disposition. Difficult to place due to tracheostomy and weight  Acute on chronic diastolic CHF (congestive heart failure) ([name]) Initially admitted for volume overloaded.  Now appears to be euvolemic.   Currently patient is on Aldactone, torsemide. Monitor volume status closely   Atypical chest pain; resolved.  -EKG and trops are neg.    Acute on chronic respiratory failure with hypoxia and hypercapnia ([name]) Trach Dependent Pseudomonas tracheitis On trach collar. S/p abx course for pseudomonas tracheitis Also evaluated for non-massive hemoptysis with CT soft tissue of neck done on 02/08/2023.  Tracheostomy tube is in place and there is no definite collection around the tube.  There is thinning of the wall between the esophagus and trachea with possible defect. Currently stable on 2 L nasal cannula   Severe COPD (chronic obstructive pulmonary disease) ([name]) on Combivent, as needed albuterol.  On yuperli nebulizer.   Acute kidney injury superimposed on CKD ([name]) Creatinine stable   Anxiety On Atarax as needed.  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    Chronic colitis Stable Continue home mesalamine   Generalized weakness Patient has been more motivated and working with physical therapy and mobility specialist, however, resistant to certain important exercises such as getting up from seated position to standing. --PT/OT     Morbid obesity with BMI of 50.0-59.9, adult ([name]) Complicating factor to overall care and prognosis.  Registered dietitian reengaged on 12/2.  Spent 30 minutes discussing dietary intake with patient.  Patient is agreeable and is tolerating a low-sodium 2 g diet   Constipation Lactulose as needed    DVT prophylaxis: [id number] Code Status: Full code  Family Communication:  Level of care: Med-Surg Dispo:   The patient is from: home Anticipated d/c is to: home Anticipated d/c date is: before Christmas or before New Year, per pt   Subjective and Interval History:  Pt reported he is walking better.  Pt said he wants to get stronger to go home, but gives a lot of restrictions on what he will or will not do with PT/OT.  Pt said his breathing is better now.   Objective: Vitals:   03/25/23 0533 03/25/23 0758 03/25/23 0812 03/25/23 1559  BP: 115/78 136/79  101/65  Pulse: 81 77 73 85  Resp: 17 18 18 19   Temp: 97.8 F (36.6 C) 97.7 F (36.5 C)  97.6 F (36.4 C)  TempSrc: Oral     SpO2: 97% 90% 94% 91%  Weight:      Height:        Intake/Output Summary (Last 24 hours) at 03/25/2023 1931 Last data filed at 03/25/2023 1926 Gross per 24 hour  Intake 720  ml  Output 2200 ml  Net -1480 ml   Filed Weights   03/21/23 0500 03/24/23 0500 03/25/23 0353  Weight: (!) 195.4 kg (!) 193.4 kg (!) 194.5 kg    Examination:   Constitutional: NAD, AAOx3 HEENT: conjunctivae and lids normal, EOMI CV: No cyanosis.   RESP: normal respiratory effort, with trach Neuro: II - XII grossly intact.   Psych: Normal mood and affect.     Data Reviewed: I have personally reviewed labs and imaging studies  Time spent: 25  minutes  Darlin Priestly, MD [facility] If 7PM-7AM, please contact night-coverage 03/25/2023, 7:31 PM
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
## 2023-03-26 ENCOUNTER — Inpatient Hospital Stay: Payer: Medicare HMO

## 2023-03-26 DIAGNOSIS — I5033 Acute on chronic diastolic (congestive) heart failure: Secondary | ICD-10-CM | POA: Diagnosis not present

## 2023-03-26 NOTE — Progress Notes (Signed)
Occupational Therapy Treatment Patient Details Name: Gary [name] MRN: [id number] DOB: August 01, 1955 Today's Date: 03/26/2023   History of present illness 67 y/o male presented to [facility] ED on 11/28/22 for chest pain x 4 days. Admitted for acute on chronic CHF. Frequent admissions this year with most recent dsicharge on 11/21/22. PMH includes obesity, hypoxia, respiratory failure with tracheostomy, COPD, GIB, HFpEF.   OT comments  [name] was seen for OT treatment on this date. Upon arrival to room pt in bed, agreeable to tx. Pt requires SUPERVISION exit bed. SBA + RW for functional mobility ~80 ft. Educated on HEP. Pt making progress toward goals, will continue to follow POC. Discharge recommendation remains appropriate.       If plan is discharge home, recommend the following:  A little help with walking and/or transfers;A lot of help with bathing/dressing/bathroom   Equipment Recommendations  BSC/3in1    Recommendations for Other Services      Precautions / Restrictions Precautions Precautions: Fall Precaution Comments: capped trach Restrictions Weight Bearing Restrictions: No       Mobility Bed Mobility Overal bed mobility: Needs Assistance Bed Mobility: Supine to Sit     Supine to sit: Supervision, HOB elevated          Transfers Overall transfer level: Needs assistance Equipment used: Rolling walker (2 wheels) Transfers: Sit to/from Stand Sit to Stand: Supervision, From elevated surface                 Balance Overall balance assessment: Needs assistance Sitting-balance support: No upper extremity supported, Feet supported Sitting balance-[name] Scale: Normal     Standing balance support: During functional activity, Bilateral upper extremity supported Standing balance-[name] Scale: Fair                             ADL either performed or assessed with clinical judgement   ADL Overall ADL's : Needs assistance/impaired                                        General ADL Comments: MAX A don B shoes in sitting      Cognition Arousal: Alert Behavior During Therapy: WFL for tasks assessed/performed Overall Cognitive Status: Within Functional Limits for tasks assessed                                                     Pertinent Vitals/ Pain       Pain Assessment Pain Assessment: No/denies pain   Frequency  Min 1X/week        Progress Toward Goals  OT Goals(current goals can now be found in the care plan section)  Progress towards OT goals: Progressing toward goals  Acute Rehab OT Goals OT Goal Formulation: With patient Time For Goal Achievement: 03/30/23 Potential to Achieve Goals: Good ADL Goals Pt Will Perform Grooming: with modified independence;standing Pt Will Transfer to Toilet: with modified independence;ambulating;regular height toilet Pt Will Perform Toileting - Clothing Manipulation and hygiene: with mod assist;sit to/from stand Additional ADL Goal #1: Pt will stand from the hospital bed or recliner adn LR DME with min A as a precursor to ADLs  Plan Discharge plan remains appropriate;Frequency remains appropriate  Co-evaluation                 AM-PAC OT "6 Clicks" Daily Activity     Outcome Measure   Help from another person eating meals?: None Help from another person taking care of personal grooming?: A Little Help from another person toileting, which includes using toliet, bedpan, or urinal?: A Lot Help from another person bathing (including washing, rinsing, drying)?: A Lot Help from another person to put on and taking off regular upper body clothing?: None Help from another person to put on and taking off regular lower body clothing?: A Lot 6 Click Score: 17    End of Session Equipment Utilized During Treatment: Rolling walker (2 wheels);Oxygen  OT Visit Diagnosis: Unsteadiness on feet (R26.81);Other abnormalities of gait and mobility  (R26.89);Muscle weakness (generalized) (M62.81);Adult, failure to thrive (R62.7)   Activity Tolerance Patient tolerated treatment well   Patient Left in chair;with call bell/phone within reach   Nurse Communication Mobility status        Time: 3086-5784 OT Time Calculation (min): 22 min  Charges: OT General Charges $OT Visit: 1 Visit OT Treatments $Therapeutic Activity: 8-22 mins  Kathie Dike, M.S. OTR/L  03/26/23, 2:11 PM  ascom [phone]

## 2023-03-26 NOTE — Progress Notes (Signed)
PROGRESS NOTE    Gary [name]  ZOX:[id number] DOB: 06-20-1955 DOA: 11/28/2022 PCP: Shayne Alken, MD  122A/122A-BB  LOS: 116 days   Brief hospital course:   Assessment & Plan: Gary [name] is a 67 y.o. 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  Patient initially ran out of torsemide and was found to be in CHF exacerbation.  Eventually became euvolemic and transition to p.o. medications.  Patient has been on trach collar status post course of antibiotics for Pseudomonas tracheitis.  Overall stable on 2 L nasal cannula.   Currently awaiting safe disposition. Difficult to place due to tracheostomy and weight  Acute on chronic diastolic CHF (congestive heart failure) ([name]) Initially admitted for volume overloaded.  Now appears to be euvolemic.   Currently patient is on Aldactone, torsemide. Monitor volume status closely   Atypical chest pain; resolved.  -EKG and trops are neg.    Acute on chronic respiratory failure with hypoxia and hypercapnia ([name]) Trach Dependent Pseudomonas tracheitis On trach collar. S/p abx course for pseudomonas tracheitis Also evaluated for non-massive hemoptysis with CT soft tissue of neck done on 02/08/2023.  Tracheostomy tube is in place and there is no definite collection around the tube.  There is thinning of the wall between the esophagus and trachea with possible defect. Currently stable on 2 L nasal cannula   Severe COPD (chronic obstructive pulmonary disease) ([name]) on Combivent, as needed albuterol.  On yuperli nebulizer.   Acute kidney injury superimposed on CKD ([name]) Creatinine stable   Anxiety On Atarax as needed.  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    Chronic colitis Stable Continue home mesalamine   Generalized weakness Patient has been more motivated and working with physical therapy and mobility specialist, however, resistant to certain important exercises such as getting up from seated position to standing. --PT/OT     Morbid obesity with BMI of 50.0-59.9, adult ([name]) Complicating factor to overall care and prognosis.  Registered dietitian reengaged on 12/2.  Spent 30 minutes discussing dietary intake with patient.  Patient is agreeable and is tolerating a low-sodium 2 g diet   Constipation Lactulose as needed    DVT prophylaxis: Gary [name] Code Status: Full code  Family Communication:  Level of care: Med-Surg Dispo:   The patient is from: home Anticipated d/c is to: home Anticipated d/c date is: before Christmas or before New Year, per pt.  Dr. Darlin Priestly:  Per conversation with multiple PT and my own observation, pt has many restrictions on what he is willing or not willing to do in turns of therapy.  Pt also makes a lot of unsupported claims such as how he used to be with PACE program which provided 16 hours of nursing care per day but then somehow our TOC canceled his PACE program so that when he was discharged home previously he had nothing set up at home to assist him.   Subjective and Interval History:  When asked about his refusing PT yesterday, he said it was too late in the day.     Objective: Vitals:   03/26/23 0418 03/26/23 0750 03/26/23 1728 03/26/23 2005  BP: (!) 120/90 107/70 95/65 116/76  Pulse: 80 78 88 88  Resp: 18 16  18 19  Temp: 97.8 F (36.6 C) 97.6 F (36.4 C)  98.6 F (37 C)  TempSrc:    Oral  SpO2: 92% 97% 96% 94%  Weight:      Height:        Intake/Output Summary (Last 24 hours) at 03/26/2023 2031 Last data filed at 03/26/2023 1024 Gross per 24 hour  Intake 240 ml  Output 350 ml  Net -110 ml   Filed Weights   03/21/23 0500 03/24/23 0500 03/25/23 0353  Weight: (!) 195.4 kg (!)  193.4 kg (!) 194.5 kg    Examination:   Constitutional: NAD, AAOx3 HEENT: conjunctivae and lids normal, EOMI CV: No cyanosis.   RESP: trach present   Data Reviewed: I have personally reviewed labs and imaging studies  Time spent: 25 minutes  Darlin Priestly, MD [facility] If 7PM-7AM, please contact night-coverage 03/26/2023, 8:31 PM

## 2023-03-26 NOTE — Progress Notes (Signed)
Mobility Specialist - Progress Note   03/26/23 1429  Mobility  Activity Transferred from chair to bed  Level of Assistance +2 (takes two people)  Programmer, systems / Overhead Lift  Activity Response Tolerated well  Mobility visit 1 Mobility  Mobility Specialist Start Time (ACUTE ONLY) 1353  Mobility Specialist Stop Time (ACUTE ONLY) 1406  Mobility Specialist Time Calculation (min) (ACUTE ONLY) 13 min   Pt sitting in the recliner upon entry, requesting transfer to bed d/t feeling of BM. Pt transferred chair to bed via overhead lift +2 for safety. Pt left side lying with bedpan placed. NT notified.  Zetta Bills Mobility Specialist 03/26/23 2:31 PM
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
## 2023-03-26 NOTE — Plan of Care (Signed)

## 2023-03-27 DIAGNOSIS — I5033 Acute on chronic diastolic (congestive) heart failure: Secondary | ICD-10-CM | POA: Diagnosis not present

## 2023-03-27 NOTE — Progress Notes (Signed)
Physical Therapy Treatment Patient Details Name: Gary [name] MRN: [id number] DOB: [birth year] Today's Date: 03/27/2023   History of Present Illness 67 y/o male presented to [facility] ED on 11/28/22 for chest pain x 4 days. Admitted for acute on chronic CHF. Frequent admissions this year with most recent dsicharge on 11/21/22. PMH includes obesity, hypoxia, respiratory failure with tracheostomy, COPD, GIB, HFpEF.    PT Comments  Pt was pleasant and willing to participate during the session and put forth good effort throughout. Pt completed bed mobility with Superv, self-directing methods and pace. Pt completed transfers with Superv, continuing to use significantly-elevated bed height, although reporting willingness to attempt lower transfers when he has more energy. Pt amb with BRW and CGA in hall with safe gait, pt ultimately requesting to sit despite encouragement to continue. Pt's SpO2 and HR were monitored during session and remained WNL on 4L O2 with gait, pt left on 2L in room at end of session. pt reporting no adverse symptoms other than fatigue and mild soreness. Pt will benefit from continued PT services upon discharge to safely address deficits listed in patient problem list for decreased caregiver assistance and eventual return to PLOF.      If plan is discharge home, recommend the following: A lot of help with bathing/dressing/bathroom;Assist for transportation;A little help with walking and/or transfers   Can travel by private vehicle     No  Equipment Recommendations  Other (comment) (TBD at next venue of care)    Recommendations for Other Services       Precautions / Restrictions Precautions Precautions: Fall Precaution Comments: capped trach Restrictions Weight Bearing Restrictions: No     Mobility  Bed Mobility Overal bed mobility: Needs Assistance Bed Mobility: Supine to Sit     Supine to sit: Supervision, HOB elevated     General bed mobility comments: completed  slowly, self-directing pace and sequencing but no real difficulty    Transfers Overall transfer level: Needs assistance Equipment used: Rolling walker (2 wheels) Transfers: Sit to/from Stand Sit to Stand: Supervision, From elevated surface           General transfer comment: self selected significantly elevated bed height for transfer, completing slowly but steady throughout; offered to work on repeated STS to increase BLE strength - pt refusing 2/2 wanting to keep strength for walking; quickly descends from standing at end of gait and reports soreness in R arm    Ambulation/Gait Ambulation/Gait assistance: Contact guard assist Gait Distance (Feet): 80 Feet Assistive device: Rolling walker (2 wheels) Gait Pattern/deviations: Step-through pattern, Decreased stride length Gait velocity: decreased     General Gait Details: slow but consistent cadence, steady throughout with no LOB, taking 1 brief standing rest break, pt opting to end gait session 2/2 fatigue despite encouragement to continue   Stairs             Wheelchair Mobility     Tilt Bed    Modified Rankin (Stroke Patients Only)       Balance Overall balance assessment: Needs assistance Sitting-balance support: No upper extremity supported, Feet supported Sitting balance-[name] Scale: Normal     Standing balance support: During functional activity, Bilateral upper extremity supported Standing balance-[name] Scale: Fair Standing balance comment: Reliant on BRW for all standing activity, no LOBs throughout                            Cognition Arousal: Alert Behavior During Therapy: [facility] for  tasks assessed/performed Overall Cognitive Status: Within Functional Limits for tasks assessed                                          Exercises Other Exercises Other Exercises: educated pt on importance of BLE exercise and attempting transfers to increase strength to achieve standard  height transfers    General Comments        Pertinent Vitals/Pain Pain Assessment Pain Assessment: Faces Faces Pain Scale: Hurts a little bit Pain Location: R forearm Pain Descriptors / Indicators: Sore Pain Intervention(s): Monitored during session    Home Living                          Prior Function            PT Goals (current goals can now be found in the care plan section) Acute Rehab PT Goals Patient Stated Goal: Get better so I can get out of here. PT Goal Formulation: With patient Time For Goal Achievement: 03/11/23 Potential to Achieve Goals: Good Progress towards PT goals: Progressing toward goals    Frequency    Min 1X/week      PT Plan      Co-evaluation              AM-PAC PT "6 Clicks" Mobility   Outcome Measure  Help needed turning from your back to your side while in a flat bed without using bedrails?: None Help needed moving from lying on your back to sitting on the side of a flat bed without using bedrails?: A Little Help needed moving to and from a bed to a chair (including a wheelchair)?: A Lot Help needed standing up from a chair using your arms (e.g., wheelchair or bedside chair)?: A Little Help needed to walk in hospital room?: A Little Help needed climbing 3-5 steps with a railing? : Total 6 Click Score: 16    End of Session Equipment Utilized During Treatment: Oxygen (2-4L O2) Activity Tolerance: Patient tolerated treatment well;Patient limited by fatigue Patient left: in chair;with call bell/phone within reach Nurse Communication: Mobility status PT Visit Diagnosis: Muscle weakness (generalized) (M62.81);Difficulty in walking, not elsewhere classified (R26.2)     Time: 4098-1191 PT Time Calculation (min) (ACUTE ONLY): 21 min  Charges:                           Rosiland Oz SPT 03/27/23, 4:30 PM

## 2023-03-27 NOTE — Progress Notes (Signed)
PROGRESS NOTE    Gary [name]  ZOX:[id number] DOB: [birth year] DOA: 11/28/2022 PCP: Shayne Alken, MD   Assessment & Plan:   Principal Problem:   Acute on chronic diastolic CHF (congestive heart failure) ([name]) Active Problems:   Acute on chronic respiratory failure with hypoxia and hypercapnia ([name])   COPD (chronic obstructive pulmonary disease) ([name])   Acute kidney injury superimposed on CKD ([name])   OSA (obstructive sleep apnea)   Anxiety   Hypotension   Chest pain   Morbid obesity with BMI of 50.0-59.9, adult ([name])   Generalized weakness   Chronic colitis   Fluid overload   Eyelid cyst, right   Dry skin   Tracheitis  Assessment and Plan: Difficult to place due to tracheostomy and weight: continue w/ supportive care. See CM's notes.    Acute on chronic diastolic WJX:[id number] admitted for volume overloaded. Appears euvolemic currently. Continue on aldactone, torsemide.    Atypical chest pain: resolved    Acute on chronic hypoxic & hypercapnic respiratory failure: continue on trach collar. S/p abx course for pseudomonas tracheitis. Also evaluated for non-massive hemoptysis with CT soft tissue of neck done on 02/08/2023.  Tracheostomy tube is in place and there is no definite collection around the tube.  There is thinning of the wall between the esophagus and trachea with possible defect. Stable currently on 2L [place]   COPD: severe. Continue on bronchodilators    AKI on CKDIIIa: Cr is labile. Avoid nephrotoxic meds   Anxiety: severity unknown. Continue on lexapro    OSA: on CPAP qhs   Right eyelid cyst: lanced by ophthalmology    Chronic colitis: continue on home dose of mesalamine   Generalized weakness: PT/OT continues to work with the pt. Pt has been more motivated and working with physical therapy and mobility specialist, however, resistant to certain important exercises such as getting up from seated position to standing.     Morbid obesity: BMI 52.1.  Complicates overall care & prognosis    Constipation: lactulose prn       DVT prophylaxis: eliquis Code Status: full  Family Communication:  Disposition Plan: unsafe d/c plan  Level of care: Med-Surg  Status is: Inpatient Remains inpatient appropriate because: unsafe d/c plan     Consultants:    Procedures:   Antimicrobials:    Subjective: Pt c/o fatigue   Objective: Vitals:   03/26/23 0750 03/26/23 1728 03/26/23 2005 03/27/23 0449  BP: 107/70 95/65 116/76 124/87  Pulse: 78 88 88 83  Resp: 16 18 19 18   Temp: 97.6 F (36.4 C)  98.6 F (37 C) (!) 97.3 F (36.3 C)  TempSrc:   Oral Oral  SpO2: 97% 96% 94% 97%  Weight:      Height:        Intake/Output Summary (Last 24 hours) at 03/27/2023 0737 Last data filed at 03/27/2023 0445 Gross per 24 hour  Intake 240 ml  Output 2800 ml  Net -2560 ml   Filed Weights   03/21/23 0500 03/24/23 0500 03/25/23 0353  Weight: (!) 195.4 kg (!) 193.4 kg (!) 194.5 kg    Examination:  General exam: Appears calm and comfortable. Morbidly obese Respiratory system: decreased breath sounds b/l  Cardiovascular system: S1 & S2+. No rubs, gallops or clicks.  Gastrointestinal system: Abdomen is obese, soft and nontender. Normal bowel sounds heard. Central nervous system: Alert and oriented. Moves all extremities Psychiatry: Judgement and insight appear normal. Flat mood and affect     Data Reviewed: I  have personally reviewed following labs and imaging studies  CBC: Recent Labs  Lab 03/25/23 1522  WBC 7.6  HGB 10.6*  HCT 38.4*  MCV 76.8*  PLT 239   Basic Metabolic Panel: Recent Labs  Lab 03/25/23 1522  NA 135  K 3.8  CL 96*  CO2 31  GLUCOSE 143*  BUN 34*  CREATININE 1.47*  CALCIUM 8.2*  MG 2.4   GFR: Estimated Creatinine Clearance: 92 mL/min (A) (by C-G formula based on SCr of 1.47 mg/dL (H)). Liver Function Tests: No results for input(s): "AST", "ALT", "ALKPHOS", "BILITOT", "PROT", "ALBUMIN" in the  last 168 hours. No results for input(s): "LIPASE", "AMYLASE" in the last 168 hours. No results for input(s): "AMMONIA" in the last 168 hours. Coagulation Profile: No results for input(s): "INR", "PROTIME" in the last 168 hours. Cardiac Enzymes: No results for input(s): "CKTOTAL", "CKMB", "CKMBINDEX", "TROPONINI" in the last 168 hours. BNP (last 3 results) No results for input(s): "PROBNP" in the last 8760 hours. HbA1C: No results for input(s): "HGBA1C" in the last 72 hours. CBG: No results for input(s): "GLUCAP" in the last 168 hours. Lipid Profile: No results for input(s): "CHOL", "HDL", "LDLCALC", "TRIG", "CHOLHDL", "LDLDIRECT" in the last 72 hours. Thyroid Function Tests: No results for input(s): "TSH", "T4TOTAL", "FREET4", "T3FREE", "THYROIDAB" in the last 72 hours. Anemia Panel: No results for input(s): "VITAMINB12", "FOLATE", "FERRITIN", "TIBC", "IRON", "RETICCTPCT" in the last 72 hours. Sepsis Labs: No results for input(s): "PROCALCITON", "LATICACIDVEN" in the last 168 hours.  No results found for this or any previous visit (from the past 240 hour(s)).       Radiology Studies: DG Chest Port 1 View  Result Date: 03/26/2023 CLINICAL DATA:  Interstitial edema, congestive heart failure EXAM: PORTABLE CHEST 1 VIEW COMPARISON:  03/07/2023 FINDINGS: Single frontal view of the chest demonstrates stable enlargement of the cardiac silhouette. Tracheostomy tube unchanged. Chronic vascular congestion without airspace disease, effusion, or pneumothorax. IMPRESSION: 1. Stable enlarged cardiac silhouette and pulmonary vascular congestion. No overt edema. Electronically Signed   By: Sharlet Salina M.D.   On: 03/26/2023 17:35        Scheduled Meds:  amphetamine-dextroamphetamine  30 mg Oral Q breakfast   apixaban  2.5 mg Oral BID   bacitracin   Topical Daily   bisacodyl  10 mg Oral Daily   docusate sodium  100 mg Oral BID   escitalopram  10 mg Oral Daily   ferrous sulfate  325 mg  Oral Daily   Gerhardt's butt cream   Topical Daily   guaiFENesin  1,200 mg Oral BID   Ipratropium-Albuterol  1 puff Inhalation Q6H   polyethylene glycol  17 g Oral BID   spironolactone  25 mg Oral Daily   torsemide  40 mg Oral BID   Continuous Infusions:   LOS: 117 days       Charise Killian, MD [facility] Pager [phone]  If 7PM-7AM, please contact night-coverage [email or web address]  03/27/2023, 7:37 AM

## 2023-03-27 NOTE — TOC Progression Note (Signed)
Transition of Care [facility]) - Progression Note    Patient Details  Name: Gary [name] MRN: [id number] Date of Birth: 26-Jan-1956  Transition of Care [facility]) CM/SW Contact  Garret Reddish, RN Phone Number: 03/27/2023, 10:54 AM  Clinical Narrative:    Chart reviewed.  FL 2 updated.  I have faxed out to [facility] and [facility] as these facilities accept trachea patients.    TOC will continue to follow progress of patient.    Expected Discharge Plan: Home w Home Health Services Barriers to Discharge: Continued Medical Work up  Expected Discharge Plan and Services     Post Acute Care Choice: Home Health   Expected Discharge Date: 02/26/23               DME Arranged: Bedside commode DME Agency: [employer] Date DME Agency Contacted: 01/12/23 Time DME Agency Contacted: 1427 Representative spoke with at DME Agency: [facility] Arranged: PT, OT HH Agency: [facility] Date [facility] Agency Contacted: 01/12/23 Time HH Agency Contacted: 1428 Representative spoke with at [facility] Agency: Kandee Keen   Social Determinants of Health (SDOH) Interventions SDOH Screenings   Food Insecurity: No Food Insecurity (12/01/2022)  Housing: Patient Declined (12/01/2022)  Transportation Needs: No Transportation Needs (12/01/2022)  Utilities: Not At Risk (12/01/2022)  Depression (PHQ2-9): Low Risk  (06/07/2021)  Financial Resource Strain: High Risk (11/21/2022)   Received from [facility]  Physical Activity: Insufficiently Active (05/03/2017)  Social Connections: Patient Declined (11/20/2022)   Received from [facility]  Stress: Stress Concern Present (11/20/2022)   Received from [facility]  Tobacco Use: Medium Risk (02/13/2023)    Readmission Risk Interventions    03/19/2023    9:58 AM 11/05/2022    2:59 PM 11/05/2022   12:16 PM  Readmission Risk Prevention Plan  Transportation Screening Complete Complete Complete  Medication Review Oceanographer) Complete Complete Complete  PCP or  Specialist appointment within 3-5 days of discharge Complete Complete Complete  HRI or Home Care Consult Complete Complete Complete  SW Recovery Care/Counseling Consult Complete Complete Complete  Palliative Care Screening Complete Complete Complete  Skilled Nursing Facility Not Applicable Not Applicable Not Applicable

## 2023-03-27 NOTE — Progress Notes (Signed)
Mobility Specialist - Progress Note   03/27/23 1036  Mobility  Activity Ambulated with assistance in room;Stood at bedside  Level of Assistance Standby assist, set-up cues, supervision of patient - no hands on  Assistive Device Front wheel walker  Distance Ambulated (ft) 24 ft  Mobility visit 1 Mobility  Mobility Specialist Start Time (ACUTE ONLY) 0945  Mobility Specialist Stop Time (ACUTE ONLY) 1023  Mobility Specialist Time Calculation (min) (ACUTE ONLY) 38 min   Pt supine upon entry, utilizing RA. Pt agreeable to attempt STS from [facility] this date. Pt completed bed mob ModI, expressed dizziness upon transfer EOB. MS placed Pt on 4L [place] prior to amb. Pt STS to RW from elevated bed height and amb 40ft with supervision to the [facility]. Once seated Pt required ModA +2 to come to standing with max effort from Pt-- cuing for hand and LE placement. Pt amb to bed, left in fowler position with needs within reach.  Zetta Bills Mobility Specialist 03/27/23 12:14 PM

## 2023-03-27 NOTE — Plan of Care (Signed)

## 2023-03-27 NOTE — Progress Notes (Deleted)
Mobility Specialist - Progress Note   03/27/23 1036  Mobility  Activity Ambulated with assistance in room;Stood at bedside  Level of Assistance Standby assist, set-up cues, supervision of patient - no hands on  Assistive Device Front wheel walker  Distance Ambulated (ft) 24 ft  Mobility visit 1 Mobility  Mobility Specialist Start Time (ACUTE ONLY) 0945  Mobility Specialist Stop Time (ACUTE ONLY) 1023  Mobility Specialist Time Calculation (min) (ACUTE ONLY) 38 min   Pt supine upon entry, utilizing 2L. Pt agreeable to OOB amb and attempt STS from [facility] this date. Pt completed bed mob ModI, expressed dizziness upon transfer EOB, placed on 4L prior to amb. Pt STS to RW and amb 12 ft in the room SBA, sitting on the BSC. Pt STS from [facility] ModA +2, max effort from Pt with extra time required to bring BLE closer to person upon standing, cuing for proper hand placement. Pt amb back to bed, left in fowler position with needs within reach.  Zetta Bills Mobility Specialist 03/27/23 10:52 AM

## 2023-03-27 NOTE — NC FL2 (Signed)
[place] MEDICAID FL2 LEVEL OF CARE FORM     IDENTIFICATION  Patient Name: Gary [name] Birthdate: November 30, 1955 Sex: male Admission Date (Current Location): 11/28/2022  [facility] and [place] Number:  [address] [id number] K Facility and Address:  [facility], [address], [place], [place] [address]      Provider Number: [id number]  Attending Physician Name and Address:  Charise Killian, MD  Relative Name and Phone Number:  Dellia Cloud Sister [phone]    Current Level of Care: Hospital Recommended Level of Care: Skilled Nursing Facility Prior Approval Number:    Date Approved/Denied:   PASRR Number: [id number]  Discharge Plan: SNF    Current Diagnoses: Patient Active Problem List   Diagnosis Date Noted   Tracheitis 02/26/2023   Dry skin 01/25/2023   Eyelid cyst, right 01/23/2023   Fluid overload 11/30/2022   Acute on chronic respiratory failure ([name]) 11/02/2022   History of DVT (deep vein thrombosis) 10/27/2022   Acute kidney injury superimposed on CKD ([name]) 10/27/2022   Acute on chronic respiratory failure with hypoxia ([name]) 10/17/2022   Chronic colitis 09/12/2022   Segmental colitis associated with diverticulosis ([name]) 09/12/2022   Diverticulosis of colon with hemorrhage 09/10/2022   Lower GI bleed 09/07/2022   Chest pain 08/13/2022   Influenza A 04/11/2022   Hypokalemia 03/06/2022   Chronic obstructive pulmonary disease (COPD) ([name]) 02/03/2022   GERD without esophagitis 02/03/2022   Anxiety 02/03/2022   Hematochezia    Major depressive disorder, recurrent episode, moderate ([name]) 10/22/2021   Pressure injury of skin 09/27/2021   COPD (chronic obstructive pulmonary disease) ([name])    Iron deficiency anemia    Generalized weakness    (HFpEF) heart failure with preserved ejection fraction ([name])    Hyperlipidemia 02/19/2021   Generalized osteoarthritis of multiple sites 10/31/2020   Hyperkalemia 03/04/2020   Marijuana use  01/17/2020   Thrombocytopenia ([name]) 01/17/2020   Positive hepatitis C antibody test 01/17/2020   Osteoarthritis of glenohumeral joints (Bilateral) 01/17/2020   Osteoarthritis of AC (acromioclavicular) joints (Bilateral) 01/17/2020   Polysubstance abuse ([name]) 08/25/2019   Toxic metabolic encephalopathy 08/25/2019   Chronic pain syndrome 07/14/2019   Anticoagulated 07/14/2019   DDD (degenerative disc disease), lumbosacral 07/14/2019   DDD (degenerative disc disease), cervical 07/14/2019   Acute on chronic respiratory failure with hypoxia and hypercapnia ([name]) 05/28/2018   Acute on chronic diastolic CHF (congestive heart failure) ([name]) 02/28/2015   OSA (obstructive sleep apnea) 02/28/2015   Morbid obesity with BMI of 50.0-59.9, adult ([name]) 04/01/2014   Hypotension 04/01/2014    Orientation RESPIRATION BLADDER Height & Weight     Self, Time, Situation, Place  O2, Other (Comment), Tracheostomy (02 at 2L per [facility]) Continent Weight: (!) 194.5 kg Height:  6\' 4"  (193 cm)  BEHAVIORAL SYMPTOMS/MOOD NEUROLOGICAL BOWEL NUTRITION STATUS   (None)  (None) Continent Diet (see dc summary)  AMBULATORY STATUS COMMUNICATION OF NEEDS Skin   Limited Assist Verbally Normal   PU Stage 2 Dressing: No Dressing (Left buttocks)                   Personal Care Assistance Level of Assistance  Bathing, Feeding, Dressing Bathing Assistance: Limited assistance Feeding assistance: Independent Dressing Assistance: Limited assistance     Functional Limitations Info  Sight, Hearing, Speech Sight Info: Adequate Hearing Info: Adequate Speech Info: Adequate    SPECIAL CARE FACTORS FREQUENCY  PT (By licensed PT), OT (By licensed OT)     PT Frequency: 5 times per week OT  Frequency: 5 times per week            Contractures Contractures Info: Not present    Additional Factors Info  Code Status, Allergies Code Status Info: full code Allergies Info: NKDA           Current Medications  (03/27/2023):  This is the current hospital active medication list Current Facility-Administered Medications  Medication Dose Route Frequency Provider Last Rate Last Admin   acetaminophen (TYLENOL) tablet 650 mg  650 mg Oral Q6H PRN Amin, Ankit C, MD   650 mg at 03/18/23 0911   ammonium lactate (LAC-HYDRIN) 12 % lotion 1 Application  1 Application Topical BID PRN Alford Highland, MD   1 Application at 01/26/23 0036   amphetamine-dextroamphetamine (ADDERALL) tablet 30 mg  30 mg Oral Q breakfast Otelia Sergeant, RPH   30 mg at 03/27/23 6962   apixaban (ELIQUIS) tablet 2.5 mg  2.5 mg Oral BID Lurene Shadow, MD   2.5 mg at 03/27/23 9528   bacitracin ointment   Topical Daily Vida Rigger, MD   1 Application at 03/27/23 0934   bisacodyl (DULCOLAX) EC tablet 10 mg  10 mg Oral Daily [name], MD   10 mg at 03/27/23 0934   docusate sodium (COLACE) capsule 100 mg  100 mg Oral BID Amin, Ankit C, MD   100 mg at 03/27/23 0934   escitalopram (LEXAPRO) tablet 10 mg  10 mg Oral Daily Alford Highland, MD   10 mg at 03/27/23 0934   ferrous sulfate tablet 325 mg  325 mg Oral Daily Amin, Ankit C, MD   325 mg at 03/27/23 4132   Gerhardt's butt cream   Topical Daily Vida Rigger, MD   Given at 03/27/23 0934   guaiFENesin (MUCINEX) 12 hr tablet 1,200 mg  1,200 mg Oral BID Vida Rigger, MD   1,200 mg at 03/27/23 0934   hydrALAZINE (APRESOLINE) injection 10 mg  10 mg Intravenous Q4H PRN Amin, Ankit C, MD       hydrOXYzine (ATARAX) tablet 25 mg  25 mg Oral TID PRN Alford Highland, MD   25 mg at 03/27/23 0933   Ipratropium-Albuterol (COMBIVENT) respimat 1 puff  1 puff Inhalation Q6H Vida Rigger, MD   1 puff at 03/27/23 0935   ipratropium-albuterol (DUONEB) 0.5-2.5 (3) MG/3ML nebulizer solution 3 mL  3 mL Nebulization Q4H PRN Amin, Ankit C, MD       lactulose (CHRONULAC) 10 GM/15ML solution 30 g  30 g Oral Daily PRN Kathrynn Running, MD   30 g at 03/13/23 0808   nitroGLYCERIN (NITROSTAT) SL tablet 0.4  mg  0.4 mg Sublingual Q5 min PRN Amin, Ankit C, MD   0.4 mg at 03/07/23 1018   ondansetron (ZOFRAN) injection 4 mg  4 mg Intravenous Q6H PRN Verdene Lennert, MD       oxyCODONE (Oxy IR/ROXICODONE) immediate release tablet 5 mg  5 mg Oral BID PRN Amin, Ankit C, MD   5 mg at 03/27/23 0933   polyethylene glycol (MIRALAX / GLYCOLAX) packet 17 g  17 g Oral BID [name], MD   17 g at 03/27/23 0934   senna-docusate (Senokot-S) tablet 1 tablet  1 tablet Oral QHS PRN Amin, Ankit C, MD       spironolactone (ALDACTONE) tablet 25 mg  25 mg Oral Daily Vida Rigger, MD   25 mg at 03/27/23 0934   torsemide (DEMADEX) tablet 40 mg  40 mg Oral BID Wouk, Wilfred Curtis, MD  40 mg at 03/27/23 0934   traZODone (DESYREL) tablet 50 mg  50 mg Oral QHS PRN Amin, Ankit C, MD   50 mg at 03/23/23 2147   trolamine salicylate (ASPERCREME) 10 % cream   Topical PRN Manuela Schwartz, NP   Given at 01/19/23 [phone]     Discharge Medications: Please see discharge summary for a list of discharge medications.  Relevant Imaging Results:  Relevant Lab Results:   Additional Information SS# [id number]  Garret Reddish, RN
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
## 2023-03-28 DIAGNOSIS — I5033 Acute on chronic diastolic (congestive) heart failure: Secondary | ICD-10-CM | POA: Diagnosis not present

## 2023-03-28 NOTE — Progress Notes (Signed)
At bedside with provider to discuss getting out of bed to commode. Patient states that we cannot lift him, patient also suggests that we walk with him in the room then insists that the purewick is preferable. Educated with the patient that when the purewick is on we cannot remove so he can use the commode will cause skin breakdown. Patient refuses to get up and insists he demands a purewick. Last order states no purewick as the patient  needs to make effort to move.

## 2023-03-28 NOTE — Progress Notes (Signed)
PROGRESS NOTE    Gary [name]  ZOX:[id number] DOB: Nov 12, 1955 DOA: 11/28/2022 PCP: Shayne Alken, MD   Assessment & Plan:   Principal Problem:   Acute on chronic diastolic CHF (congestive heart failure) ([name]) Active Problems:   Acute on chronic respiratory failure with hypoxia and hypercapnia ([name])   COPD (chronic obstructive pulmonary disease) ([name])   Acute kidney injury superimposed on CKD ([name])   OSA (obstructive sleep apnea)   Anxiety   Hypotension   Chest pain   Morbid obesity with BMI of 50.0-59.9, adult ([name])   Generalized weakness   Chronic colitis   Fluid overload   Eyelid cyst, right   Dry skin   Tracheitis  Assessment and Plan: Difficult to place due to tracheostomy and weight: continue w/ supportive care. See CM's notes.    Acute on chronic diastolic WJX:[id number] admitted for volume overloaded. Appears euvolemic currently. Continue on aldactone, torsemide    Atypical chest pain: resolved    Acute on chronic hypoxic & hypercapnic respiratory failure: continue on trach collar. S/p abx course for pseudomonas tracheitis. Also evaluated for non-massive hemoptysis with CT soft tissue of neck done on 02/08/2023.  Tracheostomy tube is in place and there is no definite collection around the tube.  There is thinning of the wall between the esophagus and trachea with possible defect. Stable currently on 2L [place]   COPD: severe. Bronchodilators prn     AKI on CKDIIIa: Cr is labile. Avoid nephrotoxic meds   Anxiety: severity unknown. Continue on lexapro    OSA: on CPAP qhs   Right eyelid cyst: lanced by ophthalmology    Chronic colitis: continue w/ supportive care. No longer taking mesalamine    Generalized weakness: PT/OT continues to work with the pt. Pt has been more motivated and working with physical therapy and mobility specialist, however, resistant to certain important exercises such as getting up from seated position to standing.   Depression:  severity unknown. Continue on lexapro    Morbid obesity: BMI 52.1. Complicates overall care & prognosis    Constipation: lactulose prn       DVT prophylaxis: eliquis Code Status: full  Family Communication:  Disposition Plan: unsafe d/c plan  Level of care: Med-Surg  Status is: Inpatient Remains inpatient appropriate because: unsafe d/c plan     Consultants:    Procedures:   Antimicrobials:    Subjective: Pt c/o malaise    Objective: Vitals:   03/27/23 0835 03/27/23 1639 03/27/23 1953 03/28/23 0800  BP: 113/83 112/69 112/80   Pulse: 80 82 83   Resp: 18 17    Temp: 97.8 F (36.6 C) 98.2 F (36.8 C) 98.1 F (36.7 C)   TempSrc:      SpO2: 97% 97% 93% 94%  Weight:      Height:        Intake/Output Summary (Last 24 hours) at 03/28/2023 0823 Last data filed at 03/28/2023 0630 Gross per 24 hour  Intake 420 ml  Output 2400 ml  Net -1980 ml   Filed Weights   03/21/23 0500 03/24/23 0500 03/25/23 0353  Weight: (!) 195.4 kg (!) 193.4 kg (!) 194.5 kg    Examination:  General exam: Appears comfortable. Morbidly obese Respiratory system: diminished breath sounds b/l  Cardiovascular system: S1/S2+. No rubs or clicks  Gastrointestinal system: abd is soft, NT, obese & hypoactive bowel sounds  Central nervous system: alert & oriented. Moves all extremities  Psychiatry: Judgement and insight appear normal.Flat mood and affect  Data Reviewed: I have personally reviewed following labs and imaging studies  CBC: Recent Labs  Lab 03/25/23 1522  WBC 7.6  HGB 10.6*  HCT 38.4*  MCV 76.8*  PLT 239   Basic Metabolic Panel: Recent Labs  Lab 03/25/23 1522  NA 135  K 3.8  CL 96*  CO2 31  GLUCOSE 143*  BUN 34*  CREATININE 1.47*  CALCIUM 8.2*  MG 2.4   GFR: Estimated Creatinine Clearance: 92 mL/min (A) (by C-G formula based on SCr of 1.47 mg/dL (H)). Liver Function Tests: No results for input(s): "AST", "ALT", "ALKPHOS", "BILITOT", "PROT", "ALBUMIN"  in the last 168 hours. No results for input(s): "LIPASE", "AMYLASE" in the last 168 hours. No results for input(s): "AMMONIA" in the last 168 hours. Coagulation Profile: No results for input(s): "INR", "PROTIME" in the last 168 hours. Cardiac Enzymes: No results for input(s): "CKTOTAL", "CKMB", "CKMBINDEX", "TROPONINI" in the last 168 hours. BNP (last 3 results) No results for input(s): "PROBNP" in the last 8760 hours. HbA1C: No results for input(s): "HGBA1C" in the last 72 hours. CBG: No results for input(s): "GLUCAP" in the last 168 hours. Lipid Profile: No results for input(s): "CHOL", "HDL", "LDLCALC", "TRIG", "CHOLHDL", "LDLDIRECT" in the last 72 hours. Thyroid Function Tests: No results for input(s): "TSH", "T4TOTAL", "FREET4", "T3FREE", "THYROIDAB" in the last 72 hours. Anemia Panel: No results for input(s): "VITAMINB12", "FOLATE", "FERRITIN", "TIBC", "IRON", "RETICCTPCT" in the last 72 hours. Sepsis Labs: No results for input(s): "PROCALCITON", "LATICACIDVEN" in the last 168 hours.  No results found for this or any previous visit (from the past 240 hours).       Radiology Studies: DG Chest Port 1 View Result Date: 03/26/2023 CLINICAL DATA:  Interstitial edema, congestive heart failure EXAM: PORTABLE CHEST 1 VIEW COMPARISON:  03/07/2023 FINDINGS: Single frontal view of the chest demonstrates stable enlargement of the cardiac silhouette. Tracheostomy tube unchanged. Chronic vascular congestion without airspace disease, effusion, or pneumothorax. IMPRESSION: 1. Stable enlarged cardiac silhouette and pulmonary vascular congestion. No overt edema. Electronically Signed   By: Sharlet Salina M.D.   On: 03/26/2023 17:35        Scheduled Meds:  amphetamine-dextroamphetamine  30 mg Oral Q breakfast   apixaban  2.5 mg Oral BID   bacitracin   Topical Daily   bisacodyl  10 mg Oral Daily   docusate sodium  100 mg Oral BID   escitalopram  10 mg Oral Daily   ferrous sulfate  325  mg Oral Daily   Gerhardt's butt cream   Topical Daily   guaiFENesin  1,200 mg Oral BID   Ipratropium-Albuterol  1 puff Inhalation Q6H   polyethylene glycol  17 g Oral BID   spironolactone  25 mg Oral Daily   torsemide  40 mg Oral BID   Continuous Infusions:   LOS: 118 days       Charise Killian, MD [facility] Pager [phone]  If 7PM-7AM, please contact night-coverage [email or web address]  03/28/2023, 8:23 AM

## 2023-03-28 NOTE — Progress Notes (Addendum)
Patient called out to have purewick changed. Patient made aware that he was not a candidate for purewick given his level of mobility. Patient immediately becoming irate and verbally aggressive, yelling at staff. Patient calling multiple times to the desk to speak with nursing staff, despite being told charge nurse would speak with him once available. Patient then called LPN's phone number to speak with his nurse, Clydie Braun. [place], LPN went to bedside to speak with patient since bedside nurse was in another room. Patient cussing, yelling and being verbally aggressive with staff. Staff members x3 asked to exit room due to patient's behavior. Pt aware disrespectful behavior will not be tolerated by staff. Charge nurse explained to patient that order was placed by MD for no purewick and that the expectation would be that he use his call bed for assistance with using urinal or BSC. Pt stated, "f*ck you b*tch" Pt educated again on behavior expectations and charge nurse exited room. Security called to come speak with patient. Johnny & Cam from security at bedside at 1152.

## 2023-03-28 NOTE — TOC Progression Note (Signed)
Transition of Care [facility]) - Progression Note    Patient Details  Name: Gary [name] MRN: [id number] Date of Birth: [birth year]  Transition of Care [facility]) CM/SW Contact  Allena Katz, LCSW Phone Number: 03/28/2023, 3:41 PM  Clinical Narrative:   [place] with [facility] reports they can take pt for [facility] PT/OT/RN whenever pt goes home.    Expected Discharge Plan: Home w Home Health Services Barriers to Discharge: Continued Medical Work up  Expected Discharge Plan and Services     Post Acute Care Choice: Home Health   Expected Discharge Date: 02/26/23               DME Arranged: Bedside commode DME Agency: [employer] Date DME Agency Contacted: 01/12/23 Time DME Agency Contacted: 1427 Representative spoke with at DME Agency: [facility] HH Arranged: PT, OT HH Agency: [facility] Date [facility] Agency Contacted: 01/12/23 Time HH Agency Contacted: 1428 Representative spoke with at [facility] Agency: Kandee Keen   Social Determinants of Health (SDOH) Interventions SDOH Screenings   Food Insecurity: No Food Insecurity (12/01/2022)  Housing: Patient Declined (12/01/2022)  Transportation Needs: No Transportation Needs (12/01/2022)  Utilities: Not At Risk (12/01/2022)  Depression (PHQ2-9): Low Risk  (06/07/2021)  Financial Resource Strain: High Risk (11/21/2022)   Received from [facility]  Physical Activity: Insufficiently Active (05/03/2017)  Social Connections: Patient Declined (11/20/2022)   Received from [facility]  Stress: Stress Concern Present (11/20/2022)   Received from [facility]  Tobacco Use: Medium Risk (02/13/2023)    Readmission Risk Interventions    03/19/2023    9:58 AM 11/05/2022    2:59 PM 11/05/2022   12:16 PM  Readmission Risk Prevention Plan  Transportation Screening Complete Complete Complete  Medication Review Oceanographer) Complete Complete Complete  PCP or Specialist appointment within 3-5 days of discharge Complete Complete Complete  HRI or Home Care  Consult Complete Complete Complete  SW Recovery Care/Counseling Consult Complete Complete Complete  Palliative Care Screening Complete Complete Complete  Skilled Nursing Facility Not Applicable Not Applicable Not Applicable

## 2023-03-28 NOTE — Progress Notes (Addendum)
Telephone call to patient in the room. Explained to patient the difficulties in locating a facility that will accept him and at this time for disposition, we have to explore other options. Discharge Home with HHC vs living with family members. Patient stated " I do not want to live with them, I have my own apt."  *Patient has 3 sisters - Harvie Bridge- works 3rd shift. Patient asked that I call her tomorrow. *Patient states that he has an apartment that he has been paying for monthly at [address], [place] and request to return there upon discharge. Ala stated that he wants to be home for Christmas. *For discharge home -patient needs HHC to be in place, he has home oxygen with [employer]  / trach supplies. *Patient asked to be set up with [facility] - will make referral.   Patient stated that he will work with Physical Therapy to get stronger. I informed him that medically he is stable for discharge and the hospital is not a place for rehab. Darrian LCSW updated on DCP. TOC will continue to work on case for discharge home.  Abelino Derrick [facility] Transition of Care Supervisor [phone]   03/28/23- 2:46 pm- Telephone call to Mitch with [employer] Patient have the following supplies / DME at home Home vent / CPAP, home oxygen with concentrator, suction machine, yanker, trach supplies with mask trach collar, uncuffed trach size 7, tubing 100 inches, nebulizer machine, connection tubing, speaking valve, trach care kit. Hospital Bed, pulse ox, trapeze bar, walker, wide bedside commode, Wheelchair to be delivered once discharge date has been established. Will keep Mitch with [employer] updated. Abelino Derrick [facility]  03/28/23 3:25 pm- Referral made to [facility] ( [facility])  [phone]. Message left with intake coordinator Margot Ables; awaiting callback. Abelino Derrick RN,MHA,CCM

## 2023-03-28 NOTE — Progress Notes (Signed)
Patient seen with bedside nurse, Clydie Braun and Dr. Karna Christmas. Patient educated again on importance of mobility and using urinal/BSC. Pt demonstrated ability to stand from bed with only assistance of gathering supplies. Pt then refused to attempt sitting on BSC. When asked about his home situation, patient replied that he voids "in a urinal" and then "dumps it in a bucket."  Per bedside nurse, patient did very well using urinal earlier today, although he did dump it in the trash can instead of calling for assistance.
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
## 2023-03-28 NOTE — Progress Notes (Addendum)
RD Consult Note  Wt Readings from Last 15 Encounters:  03/25/23 (!) 194.5 kg  11/12/22 (!) 197.2 kg  10/16/22 (!) 204.1 kg  10/03/22 (!) 218.4 kg  09/12/22 (!) 180.7 kg  08/21/22 (!) 182.4 kg  04/14/22 (!) 160.4 kg  04/06/22 (!) 193.2 kg  01/19/22 (!) 171.5 kg  01/03/22 (!) 171.5 kg  12/26/21 (!) 173.1 kg  12/12/21 (!) 183.7 kg  08/21/21 (!) 171.5 kg  08/15/21 (!) 181.8 kg  07/23/21 (!) 185.5 kg   RD received another consult for pt education to explain diet restriction to pt and why this would be beneficial.   Case discussed with LPN, charge nurse, TOC, and MD. Per LPN, staff members have been receiving pushback from pt regarding compliance to care plan. Per MD, administration is requesting RD consult to document efforts to educate pt on appropriate diet.   RD is very familiar to pt and has been following pt during this hospitalization and has been involved in the care of pt during multiple prior hospitalizations. During this admission alone, RD has been consulted several times to provide diet education to this pt, which RD provided (refer to notes on 12/31/22, 03/18/23, and 03/28/23). Pt with prolonged hospital stay secondary to barriers to discharge, which include trach and pt's size (per previous discussions with TOC, pt exceeds weight limit of 350# of most local facilities). Per discussion with TOC today, pt is now agreeable to discharge home with home health and team is working on arrangements to facilitate his discharge.   RD spoke with pt at bedside today. He minimally engaged with this RD. He closed his eyes, did not make eye contact, or move his head to RD's direction despite efforts to engage. When RD asked questions regarding pt's nutritional status and diet, pt continued to reply "it's the same, it's fine". RD reviewed pt's nutrition plan of care with him, which included a 2 gram sodium (low sodium diet). This diet has been ordered to pt as the most appropriate diet to offer him  the widest variety of food selections during his prolonged hospitalization and support his diagnosis of CHF to assist with volume management. RD reinforced this again with pt today. RD asked pt if he had any further questions or concerns at this time, which pt declined.   Pt has been placed on the most appropriate diet to support his current medical conditions. Weights have also been ordered to help document pt's progress; pt has lost 24# (10.0%) of weight over the past 6 months, which is significant for time frame, but favorable given pt's morbid obesity. Also noted pt's efforts of increased mobility, including his ability to walk around the nurse's station daily with assistance of mobility tech.   Throughout the past year, RD has made multiple attempts to establish rapport with pt, educate on most appropriate nutrition plan of care, motivate pt, and discuss how pt's nutrition care plan aligns with overall care plan to assist pt in meeting his goals (refer to notes on 07/04/19, 07/05/22, 12/31/22, 03/18/23, and today's note). Pt's efforts continue to be inconsistent overall despite RD's continued efforts and intervention. While staff members (including RD) develop individualized care plan to assist pt in meeting goals of care, pt is ultimately responsible for following care plan recommendations. Would not recommend very low calorie diet for the sole purpose of weight loss due to risk of losing lean body mass which would impact pt's rehab potential. RD does not have control over food and beverages that  are provided outside of meal trays/ nutritional services department. There is nothing further to offer for a nutritional standpoint.   Body mass index is 52.19 kg/m. Patient meets criteria for obesity, class III based on current BMI.    Current diet order is 2 gram sodium, patient is consuming approximately 100% of meals at this time. Labs and medications reviewed.   Levada Schilling, RD, LDN, CDCES Registered  Dietitian III Certified Diabetes Care and Education Specialist If unable to reach this RD, please use "RD Inpatient" group chat on secure chat between hours of 8am-4 pm daily

## 2023-03-28 NOTE — Plan of Care (Signed)

## 2023-03-29 DIAGNOSIS — I5033 Acute on chronic diastolic (congestive) heart failure: Secondary | ICD-10-CM | POA: Diagnosis not present

## 2023-03-29 NOTE — Progress Notes (Signed)
PROGRESS NOTE    Gary [name]  WUJ:[id number] DOB: 1955/07/22 DOA: 11/28/2022 PCP: Shayne Alken, MD   Assessment & Plan:   Principal Problem:   Acute on chronic diastolic CHF (congestive heart failure) ([name]) Active Problems:   Acute on chronic respiratory failure with hypoxia and hypercapnia ([name])   COPD (chronic obstructive pulmonary disease) ([name])   Acute kidney injury superimposed on CKD ([name])   OSA (obstructive sleep apnea)   Anxiety   Hypotension   Chest pain   Morbid obesity with BMI of 50.0-59.9, adult ([name])   Generalized weakness   Chronic colitis   Fluid overload   Eyelid cyst, right   Dry skin   Tracheitis  Assessment and Plan: Difficult to place due to tracheostomy and weight: continue w/ supportive care. See CM's notes.    Acute on chronic diastolic NFA:[id number] admitted for volume overloaded. Appears euvolemic currently. Continue on torsemide, aldactone   Atypical chest pain: resolved    Acute on chronic hypoxic & hypercapnic respiratory failure: continue on trach collar. S/p abx course for pseudomonas tracheitis. Also evaluated for non-massive hemoptysis with CT soft tissue of neck done on 02/08/2023.  Tracheostomy tube is in place and there is no definite collection around the tube.  There is thinning of the wall between the esophagus and trachea with possible defect. Stable currently on 2L [place]   COPD: severe. Bronchodilators prn     AKI on CKDIIIa: Cr is labile.  Avoid nephrotoxic meds    OSA: on CPAP qhs   Right eyelid cyst: lanced by ophthalmology    Chronic colitis: continue w/ supportive care. No longer taking mesalamine    Generalized weakness: PT/OT continues to work with the pt. Pt has been more motivated and working with physical therapy and mobility specialist, however, resistant to certain important exercises such as getting up from seated position to standing.   Depression & anxiety: severity unknown. Continue on lexapro     Morbid obesity: BMI 52.1. Complicates overall care & prognosis    Constipation: lactulose prn       DVT prophylaxis: eliquis Code Status: full  Family Communication:  Disposition Plan: unsafe d/c plan  Level of care: Med-Surg  Status is: Inpatient Remains inpatient appropriate because: unsafe d/c plan     Consultants:    Procedures:   Antimicrobials:    Subjective: Pt c/o difficulty getting out of bed by himself     Objective: Vitals:   03/28/23 1620 03/28/23 2058 03/29/23 0500 03/29/23 0507  BP: 114/76 112/71  116/85  Pulse: 84 83  78  Resp: 20 (!) 22  20  Temp: 98.4 F (36.9 C) 98.6 F (37 C)  97.7 F (36.5 C)  TempSrc:  Oral  Oral  SpO2: 95% 93%  93%  Weight:   (!) 194.6 kg   Height:       No intake or output data in the 24 hours ending 03/29/23 0808  Filed Weights   03/24/23 0500 03/25/23 0353 03/29/23 0500  Weight: (!) 193.4 kg (!) 194.5 kg (!) 194.6 kg    Examination:  General exam: Appears comfortable. Morbidly obese  Respiratory system: decreased breath sounds b/l Cardiovascular system: S1 & S2+ Gastrointestinal system: abd is soft, NT, obese & hypoactive bowel sounds  Central nervous system: alert & oriented. Moves all extremities  Psychiatry: Judgement and insight appear normal. Agitated mood and affect    Data Reviewed: I have personally reviewed following labs and imaging studies  CBC: Recent Labs  Lab  03/25/23 1522  WBC 7.6  HGB 10.6*  HCT 38.4*  MCV 76.8*  PLT 239   Basic Metabolic Panel: Recent Labs  Lab 03/25/23 1522  NA 135  K 3.8  CL 96*  CO2 31  GLUCOSE 143*  BUN 34*  CREATININE 1.47*  CALCIUM 8.2*  MG 2.4   GFR: Estimated Creatinine Clearance: 92 mL/min (A) (by C-G formula based on SCr of 1.47 mg/dL (H)). Liver Function Tests: No results for input(s): "AST", "ALT", "ALKPHOS", "BILITOT", "PROT", "ALBUMIN" in the last 168 hours. No results for input(s): "LIPASE", "AMYLASE" in the last 168 hours. No  results for input(s): "AMMONIA" in the last 168 hours. Coagulation Profile: No results for input(s): "INR", "PROTIME" in the last 168 hours. Cardiac Enzymes: No results for input(s): "CKTOTAL", "CKMB", "CKMBINDEX", "TROPONINI" in the last 168 hours. BNP (last 3 results) No results for input(s): "PROBNP" in the last 8760 hours. HbA1C: No results for input(s): "HGBA1C" in the last 72 hours. CBG: No results for input(s): "GLUCAP" in the last 168 hours. Lipid Profile: No results for input(s): "CHOL", "HDL", "LDLCALC", "TRIG", "CHOLHDL", "LDLDIRECT" in the last 72 hours. Thyroid Function Tests: No results for input(s): "TSH", "T4TOTAL", "FREET4", "T3FREE", "THYROIDAB" in the last 72 hours. Anemia Panel: No results for input(s): "VITAMINB12", "FOLATE", "FERRITIN", "TIBC", "IRON", "RETICCTPCT" in the last 72 hours. Sepsis Labs: No results for input(s): "PROCALCITON", "LATICACIDVEN" in the last 168 hours.  No results found for this or any previous visit (from the past 240 hours).       Radiology Studies: No results found.       Scheduled Meds:  amphetamine-dextroamphetamine  30 mg Oral Q breakfast   apixaban  2.5 mg Oral BID   bacitracin   Topical Daily   bisacodyl  10 mg Oral Daily   docusate sodium  100 mg Oral BID   escitalopram  10 mg Oral Daily   ferrous sulfate  325 mg Oral Daily   Gerhardt's butt cream   Topical Daily   guaiFENesin  1,200 mg Oral BID   Ipratropium-Albuterol  1 puff Inhalation Q6H   polyethylene glycol  17 g Oral BID   spironolactone  25 mg Oral Daily   torsemide  40 mg Oral BID   Continuous Infusions:   LOS: 119 days       Charise Killian, MD [facility] Pager [phone]  If 7PM-7AM, please contact night-coverage [email or web address]  03/29/2023, 8:08 AM

## 2023-03-29 NOTE — Progress Notes (Signed)
Mobility Specialist - Progress Note  Post-mobility: HR 104, SPO2 90%   03/29/23 1049  Mobility  Activity Transferred to/from BSC;Stood at bedside  Level of Assistance Standby assist, set-up cues, supervision of patient - no hands on  Assistive Device Front wheel walker;BSC  Distance Ambulated (ft) 4 ft  Activity Response Tolerated well  Mobility visit 1 Mobility  Mobility Specialist Start Time (ACUTE ONLY) 0945  Mobility Specialist Stop Time (ACUTE ONLY) 1036  Mobility Specialist Time Calculation (min) (ACUTE ONLY) 51 min   Pt supine upon entry, utilizing RA. Pt agreeable to practice transfers to/from the [facility] this date. Pt completed bed mob ModI, STS to RW and transferred to the [facility] with supervision, remained on 2L during activity. Once seated on the BSC, Pt indep scoot towards the edge, STS to RW MinG-SBA-- Max effort from Pt, no hands on required, extra time required to bring trunk upright upon standing. Pt transferred to the EOB, and completed another STS and transfer to the [facility]-- this time standing from a slightly less elevated bed height. Pt STS from [facility] SBA, transferred EOB, left in fowler position with needs within reach.   Zetta Bills Mobility Specialist 03/29/23 11:08 AM

## 2023-03-29 NOTE — Plan of Care (Signed)
  Problem: Education: Goal: Knowledge of General Education information will improve Description: Including pain rating scale, medication(s)/side effects and non-pharmacologic comfort measures Outcome: Progressing   Problem: Clinical Measurements: Goal: Ability to maintain clinical measurements within normal limits will improve Outcome: Progressing   Problem: Nutrition: Goal: Adequate nutrition will be maintained Outcome: Progressing   Problem: Safety: Goal: Ability to remain free from injury will improve Outcome: Progressing   
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
## 2023-03-29 NOTE — Progress Notes (Addendum)
9:43 am - Voicemail left with patient's sister Gavin Pound [phone]) concerning discharge planning for home. Awaiting callback. [facility] ( [facility] called - awaiting callback.  Abelino Derrick RN,MHA,CCM Transition of Care Supervisor [phone]  11:30 am - Received a call from [facility]. Referral made as requested and they will contact the patient today. Abelino Derrick RN,MHA,CCM

## 2023-03-29 NOTE — Progress Notes (Signed)
Physical Therapy Treatment Patient Details Name: Gary [name] MRN: [id number] DOB: 05/25/1955 Today's Date: 03/29/2023   History of Present Illness 67 y/o male presented to [facility] ED on 11/28/22 for chest pain x 4 days. Admitted for acute on chronic CHF. Frequent admissions this year with most recent dsicharge on 11/21/22. PMH includes obesity, hypoxia, respiratory failure with tracheostomy, COPD, GIB, HFpEF.    PT Comments  Pt was pleasant and showing improved motivation this session, putting forth good effort throughout. Pt completed bed mobility with Superv, completing slowly at self-selected pace, with no real difficulty. Pt performing STS with Superv from significantly elevated bed as insisting on, despite encouragement to attempt from lower height. Pt amb in hall with BRW and CGA, taking standing rest breaks between bouts before requesting to sit and end session. Pt's vitals were monitored during session and remained WNL on 4L O2 with transfers/gait, pt left on 2L as found. Pt reporting fatigue and BLE weakness within session. Pt will benefit from continued PT services upon discharge to safely address deficits listed in patient problem list for decreased caregiver assistance and eventual return to PLOF.    If plan is discharge home, recommend the following: A lot of help with bathing/dressing/bathroom;Assist for transportation;A little help with walking and/or transfers   Can travel by private vehicle     No  Equipment Recommendations  Other (comment) (TBD at next venue of care)    Recommendations for Other Services       Precautions / Restrictions Precautions Precautions: Fall Precaution Comments: capped trach Restrictions Weight Bearing Restrictions Per Provider Order: No RLE Weight Bearing Per Provider Order: Weight bearing as tolerated     Mobility  Bed Mobility Overal bed mobility: Needs Assistance Bed Mobility: Supine to Sit     Supine to sit: Supervision, HOB elevated,  Used rails     General bed mobility comments: completed more quickly compared to prior sessions, still self-directing pace and sequencing, but no difficulty    Transfers Overall transfer level: Needs assistance Equipment used: Rolling walker (2 wheels) Transfers: Sit to/from Stand Sit to Stand: Supervision, From elevated surface           General transfer comment: insistent on significantly elevated bed height for transfer despite encouragement to attempt lower height STS - reporting he did two transfers to/from low BSC earlier; completing this STS slowly but safe throughout; quickly descends from standing at end of gait reporting fatigue    Ambulation/Gait Ambulation/Gait assistance: +2 safety/equipment, Contact guard assist Gait Distance (Feet): 120 Feet Assistive device: Rolling walker (2 wheels) Gait Pattern/deviations: Step-through pattern, Decreased stride length Gait velocity: decreased     General Gait Details: slow but consistent cadence, taking 2 brief standing rest breaks this session as encouraged to increase total distance, steady throughout with no LOB, pt opting to end gait session 2/2 fatigue, on 4L O2 throughout gait   Stairs             Wheelchair Mobility     Tilt Bed    Modified Rankin (Stroke Patients Only)       Balance Overall balance assessment: Needs assistance Sitting-balance support: No upper extremity supported, Feet supported Sitting balance-[name] Scale: Normal     Standing balance support: During functional activity, Bilateral upper extremity supported Standing balance-[name] Scale: Fair Standing balance comment: Reliant on BRW for all standing activity, min-no unsteadiness with dynamic activity though increased sway  Cognition Arousal: Alert Behavior During Therapy: WFL for tasks assessed/performed Overall Cognitive Status: Within Functional Limits for tasks assessed                                           Exercises      General Comments        Pertinent Vitals/Pain Pain Assessment Pain Assessment: No/denies pain Pain Intervention(s): Monitored during session    Home Living                          Prior Function            PT Goals (current goals can now be found in the care plan section) Acute Rehab PT Goals Patient Stated Goal: Get better so I can get out of here. PT Goal Formulation: With patient Time For Goal Achievement: 03/11/23 Potential to Achieve Goals: Good Progress towards PT goals: Progressing toward goals    Frequency    Min 1X/week      PT Plan      Co-evaluation              AM-PAC PT "6 Clicks" Mobility   Outcome Measure  Help needed turning from your back to your side while in a flat bed without using bedrails?: None Help needed moving from lying on your back to sitting on the side of a flat bed without using bedrails?: A Little Help needed moving to and from a bed to a chair (including a wheelchair)?: A Lot Help needed standing up from a chair using your arms (e.g., wheelchair or bedside chair)?: A Little Help needed to walk in hospital room?: A Little Help needed climbing 3-5 steps with a railing? : Total 6 Click Score: 16    End of Session Equipment Utilized During Treatment: Oxygen (2L in bed, 4L for transfers/gait) Activity Tolerance: Patient tolerated treatment well;Patient limited by fatigue Patient left: in chair;with call bell/phone within reach Nurse Communication: Mobility status PT Visit Diagnosis: Muscle weakness (generalized) (M62.81);Difficulty in walking, not elsewhere classified (R26.2)     Time: 2952-8413 PT Time Calculation (min) (ACUTE ONLY): 24 min  Charges:                           Rosiland Oz SPT 03/29/23, 3:44 PM

## 2023-03-30 DIAGNOSIS — I5033 Acute on chronic diastolic (congestive) heart failure: Secondary | ICD-10-CM | POA: Diagnosis not present

## 2023-03-30 NOTE — Progress Notes (Signed)
PROGRESS NOTE   HPI was taken from Dr. Huel Cote: Gary [name] is a 67 y.o. 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   Gary [name] states that he ran out of his home torsemide several days ago due to a mixup with his pharmacy.  Then over the last couple days, he has noticed increasing lower extremity swelling, shortness of breath and chest pain.  He states that he usually develops chest pain when he has "fluid on his lungs."  He notes that his girlfriend feels that his face has even become more edematous.  During this time, he noticed some edema in his bilateral hands that was much worse on the left hand with associated numbness and tingling but this has improved.  He denies any nausea, vomiting, abdominal pain.   Gary [name] states that at 1 point, his supplemental oxygen level was as low as 6, but more recently he wears between 6-8L depending on how he feels.   ED course: On arrival to the ED, patient was hypertensive at 131/93 with heart rate of 103.  He was saturating at 91% on 8 L via trach collar.  He was afebrile at 98.8.  Initial workup notable for pH of 7.3 with pCO2 of 73, hemoglobin of 10.5, potassium 3.4, bicarb 34, glucose 119, BUN 28, creatinine 1.41 with GFR 55.  Troponin negative x 2.  BNP within normal limits.Chest x-ray notable for vascular congestion and cardiomegaly.  CT of the head and C-spine were obtained with no acute abnormalities.  Patient started on Lasix and TRH contacted for admission  As per Dr. Mayford Knife 12/11-12/14/24: Pt has remained medically stable. Pt continues to intermittently refuse to work with therapy and wants to continue to use a purewick. HH was setup by CM for the pt for if and when the pt ever is d/c home.    Gary [name]  JYN:[id number] DOB: 05-10-1955  DOA: 11/28/2022 PCP: Shayne Alken, MD   Assessment & Plan:   Principal Problem:   Acute on chronic diastolic CHF (congestive heart failure) ([name]) Active Problems:   Acute on chronic respiratory failure with hypoxia and hypercapnia ([name])   COPD (chronic obstructive pulmonary disease) ([name])   Acute kidney injury superimposed on CKD ([name])   OSA (obstructive sleep apnea)   Anxiety   Hypotension   Chest pain   Morbid obesity with BMI of 50.0-59.9, adult ([name])   Generalized weakness   Chronic colitis   Fluid overload   Eyelid cyst, right   Dry skin   Tracheitis  Assessment and Plan: Difficult to place due to tracheostomy and weight: continue w/ supportive care. See CM's notes.    Acute on chronic diastolic QMV:HQIONGEXB admitted for volume overloaded. Appears euvolemic currently. Continue on aldactone, torsemide.    Atypical chest pain: resolved    Acute on chronic hypoxic & hypercapnic respiratory failure: continue on trach collar. S/p abx course for pseudomonas tracheitis. Also evaluated for non-massive hemoptysis with CT soft tissue of neck done on 02/08/2023.  Tracheostomy tube is in place and there is no definite collection around the tube.  There is thinning of the wall between the esophagus and trachea with possible defect. Stable currently on 2L [place]   COPD: severe. Bronchodilators prn    AKI on CKDIIIa: Cr is labile. Avoid nephrotoxic  meds    OSA: CPAP qhs   Right eyelid cyst: lanced by ophthalmology    Chronic colitis: continue w/ supportive care. No longer taking mesalamine    Generalized weakness: PT/OT continues to work with the pt. Pt has been more motivated and working with physical therapy and mobility specialist, however, resistant to certain important exercises such as getting up from seated position to standing.   Depression & anxiety: severity unknown. Continue on lexapro   Morbid obesity: BMI 52.1. Complicates overall care & prognosis     Constipation: lactulose prn       DVT prophylaxis: eliquis Code Status: full  Family Communication:  Disposition Plan: unsafe d/c plan  Level of care: Med-Surg  Status is: Inpatient Remains inpatient appropriate because: unsafe d/c plan     Consultants:    Procedures:   Antimicrobials:    Subjective: Pt c/o malaise    Objective: Vitals:   03/29/23 1546 03/29/23 2052 03/30/23 0612 03/30/23 0810  BP: 122/75 108/80 100/70 101/86  Pulse: 85 82 83 82  Resp: 18 20 20 18   Temp: 98.3 F (36.8 C) 97.9 F (36.6 C) 97.7 F (36.5 C) 98 F (36.7 C)  TempSrc: Oral     SpO2: 94% 94% 92% 97%  Weight:      Height:        Intake/Output Summary (Last 24 hours) at 03/30/2023 0820 Last data filed at 03/29/2023 0900 Gross per 24 hour  Intake 240 ml  Output --  Net 240 ml    Filed Weights   03/24/23 0500 03/25/23 0353 03/29/23 0500  Weight: (!) 193.4 kg (!) 194.5 kg (!) 194.6 kg    Examination:  General exam: appears calm. Morbidly obese Respiratory system: diminished breath sounds b/l Cardiovascular system: S1/S2+ Gastrointestinal system: abd is soft, NT, obese & hypoactive bowel sounds  Central nervous system: alert & oriented. Moves all extremities  Psychiatry: judgement and insight appears poor. Flat mood and affect    Data Reviewed: I have personally reviewed following labs and imaging studies  CBC: Recent Labs  Lab 03/25/23 1522  WBC 7.6  HGB 10.6*  HCT 38.4*  MCV 76.8*  PLT 239   Basic Metabolic Panel: Recent Labs  Lab 03/25/23 1522  NA 135  K 3.8  CL 96*  CO2 31  GLUCOSE 143*  BUN 34*  CREATININE 1.47*  CALCIUM 8.2*  MG 2.4   GFR: Estimated Creatinine Clearance: 92 mL/min (A) (by C-G formula based on SCr of 1.47 mg/dL (H)). Liver Function Tests: No results for input(s): "AST", "ALT", "ALKPHOS", "BILITOT", "PROT", "ALBUMIN" in the last 168 hours. No results for input(s): "LIPASE", "AMYLASE" in the last 168 hours. No results for  input(s): "AMMONIA" in the last 168 hours. Coagulation Profile: No results for input(s): "INR", "PROTIME" in the last 168 hours. Cardiac Enzymes: No results for input(s): "CKTOTAL", "CKMB", "CKMBINDEX", "TROPONINI" in the last 168 hours. BNP (last 3 results) No results for input(s): "PROBNP" in the last 8760 hours. HbA1C: No results for input(s): "HGBA1C" in the last 72 hours. CBG: No results for input(s): "GLUCAP" in the last 168 hours. Lipid Profile: No results for input(s): "CHOL", "HDL", "LDLCALC", "TRIG", "CHOLHDL", "LDLDIRECT" in the last 72 hours. Thyroid Function Tests: No results for input(s): "TSH", "T4TOTAL", "FREET4", "T3FREE", "THYROIDAB" in the last 72 hours. Anemia Panel: No results for input(s): "VITAMINB12", "FOLATE", "FERRITIN", "TIBC", "IRON", "RETICCTPCT" in the last 72 hours. Sepsis Labs: No results for input(s): "PROCALCITON", "LATICACIDVEN" in the last 168 hours.  No results  found for this or any previous visit (from the past 240 hours).       Radiology Studies: No results found.       Scheduled Meds:  amphetamine-dextroamphetamine  30 mg Oral Q breakfast   apixaban  2.5 mg Oral BID   bacitracin   Topical Daily   bisacodyl  10 mg Oral Daily   docusate sodium  100 mg Oral BID   escitalopram  10 mg Oral Daily   ferrous sulfate  325 mg Oral Daily   Gerhardt's butt cream   Topical Daily   guaiFENesin  1,200 mg Oral BID   Ipratropium-Albuterol  1 puff Inhalation Q6H   polyethylene glycol  17 g Oral BID   spironolactone  25 mg Oral Daily   torsemide  40 mg Oral BID   Continuous Infusions:   LOS: 120 days       Charise Killian, MD [facility] Pager [phone]  If 7PM-7AM, please contact night-coverage [email or web address]  03/30/2023, 8:20 AM

## 2023-03-30 NOTE — Plan of Care (Signed)
  Problem: Clinical Measurements: Goal: Ability to maintain clinical measurements within normal limits will improve Outcome: Progressing Goal: Cardiovascular complication will be avoided Outcome: Progressing   Problem: Nutrition: Goal: Adequate nutrition will be maintained Outcome: Progressing   

## 2023-03-31 DIAGNOSIS — I5033 Acute on chronic diastolic (congestive) heart failure: Secondary | ICD-10-CM | POA: Diagnosis not present

## 2023-03-31 DIAGNOSIS — J439 Emphysema, unspecified: Secondary | ICD-10-CM | POA: Diagnosis not present

## 2023-03-31 DIAGNOSIS — N179 Acute kidney failure, unspecified: Secondary | ICD-10-CM | POA: Diagnosis not present

## 2023-03-31 DIAGNOSIS — J9621 Acute and chronic respiratory failure with hypoxia: Secondary | ICD-10-CM | POA: Diagnosis not present

## 2023-03-31 MED ORDER — CLOTRIMAZOLE 1 % EX CREA
TOPICAL_CREAM | Freq: Two times a day (BID) | CUTANEOUS | Status: DC
  Administered 2023-04-04 – 2023-04-12 (×2): 1 via TOPICAL
  Filled 2023-03-31 (×2): qty 15

## 2023-03-31 NOTE — Plan of Care (Signed)
  Problem: Clinical Measurements: Goal: Ability to maintain clinical measurements within normal limits will improve Outcome: Progressing Goal: Diagnostic test results will improve Outcome: Progressing Goal: Respiratory complications will improve Outcome: Progressing Goal: Cardiovascular complication will be avoided Outcome: Progressing   

## 2023-03-31 NOTE — Progress Notes (Signed)
Progress Note   Patient: Gary [name] NFA:[id number] DOB: May 27, 1955 DOA: 11/28/2022     121 DOS: the patient was seen and examined on 03/31/2023   Brief hospital course:  Brief Narrative:  Gary [name] is a 67 y.o. 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  Patient initially ran out of torsemide and was found to be in CHF exacerbation.  Eventually became euvolemic and transition to p.o. medications.  Patient has been on trach collar status post course of antibiotics for Pseudomonas tracheitis.  Overall stable on 2 L nasal cannula.  Currently awaiting safe disposition. Difficult to place due to tracheostomy      Assessment and Plan: * Acute on chronic diastolic CHF (congestive heart failure) ([name]) Continue torsemide 40 mg twice daily.  Continue spironolactone.  Creatinine stable at 1.47.  No beta-blocker with respiratory issues.  Acute on chronic respiratory failure with hypoxia and hypercapnia ([name]) This morning was on 2 L.  Continue ventilator at night.  Patient will need prolonged weaning secondary to severe COPD with hypercapnic hypoxic respiratory failure and sleep apnea.  Earlier in the hospital stay was on much more oxygen.  COPD (chronic obstructive pulmonary disease) ([name]) Severe COPD on Combivent, as needed albuterol.   Acute kidney injury superimposed on CKD ([name]) Acute kidney injury on CKD stage IIIa.  Creatinine 1.47 with diuresis.  (On 9/18 did have a creatinine of 1.17).  Creatinine will likely stay higher being on torsemide 40 mg twice a day.  Anxiety On Atarax as needed.  Continue Lexapro  OSA (obstructive sleep apnea) Continue home ventilator at night  Chest pain ACS ruled out.  Hypotension Off midodrine  Dry skin Lac-Hydrin lotion to feet.  Add  antifungal cream to feet.  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  Chronic colitis Continue home mesalamine  Generalized weakness Continue working with physical therapy and mobility specialist to work on getting out of the bed and ambulating.  Continue also doing exercises in the bed.  Morbid obesity with BMI of 50.0-59.9, adult ([name]) BMI 52.22 with last height and weight in computer.         Subjective: Patient down to 2 L of oxygen.  Breathing okay.  Seen with pulmonary.  Encouraged to continue working hard with physical therapy.  Admitted to 122 days ago with chest pain and leg swelling.  Physical Exam: Vitals:   03/30/23 2043 03/31/23 0506 03/31/23 0751 03/31/23 0812  BP: 118/76 127/86  99/66  Pulse: 90 84  81  Resp: 18 19  20   Temp: 98.1 F (36.7 C) 98.2 F (36.8 C)  97.8 F (36.6 C)  TempSrc:      SpO2: 94% 90% 93% 92%  Weight:      Height:       Physical Exam HENT:     Head: Normocephalic.  Eyes:     General: Lids are normal.     Conjunctiva/sclera: Conjunctivae normal.  Cardiovascular:     Rate and Rhythm: Normal rate and regular rhythm.     Heart sounds: Normal heart sounds, S1 normal and S2 normal.  Pulmonary:     Breath sounds: Examination of the right-lower field reveals decreased breath sounds. Examination of the left-lower field reveals decreased breath sounds. Decreased breath sounds present. No wheezing or rhonchi.  Abdominal:  Palpations: Abdomen is soft.     Tenderness: There is no abdominal tenderness.  Musculoskeletal:     Right lower leg: Swelling present.     Left lower leg: Swelling present.  Skin:    General: Skin is warm.     Findings: No rash.  Neurological:     Mental Status: He is alert and oriented to person, place, and time.     Data Reviewed: Last creatinine 1.47 with a GFR 53, last hemoglobin 10.6   Disposition: Status is: Inpatient Remains inpatient appropriate because: We do not have a safe disposition   Planned Discharge Destination: We do not have a safe disposition    Time spent: 27 minutes Case discussed with pulmonary  Author: Alford Highland, MD 03/31/2023 12:24 PM  For on call review [email or web address].

## 2023-03-31 NOTE — Progress Notes (Signed)
Mobility Specialist - Progress Note     03/31/23 1300  Mobility  Activity Ambulated with assistance in hallway;Stood at bedside;Dangled on edge of bed  Level of Assistance Standby assist, set-up cues, supervision of patient - no hands on  Assistive Device Front wheel walker  Distance Ambulated (ft) 120 ft  Range of Motion/Exercises Active  Activity Response Tolerated well  Mobility Referral Yes  Mobility visit 1 Mobility   Pt resting in bed on 2L upon entry. Pt STS and ambulates to hallway on 4L 90%> SBA with Rw and chair follow. Pt wheeled back to room due to BM but, pt endorses no pain or fatigue. Pt returned to bed using [place]/maxilift and left in bed with needs in reach.   Johnathan Hausen Mobility Specialist 03/31/23, 1:38 PM
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
## 2023-03-31 NOTE — Progress Notes (Signed)
Mobility Specialist - Progress Note    03/31/23 1750  Mobility  Activity Ambulated with assistance in hallway;Stood at bedside;Transferred from chair to bed  Level of Assistance Standby assist, set-up cues, supervision of patient - no hands on  Assistive Device Front wheel walker;MaxiSky / Overhead Lift  Distance Ambulated (ft) 120 ft  Range of Motion/Exercises Active  Activity Response Tolerated well  Mobility Referral Yes  Mobility visit 1 Mobility   Pt resting in bed on 2L upon entry. Pt STS and ambulates to hallway around NS SBA with RW on 4L. Pt maintains upright positioning during ambulation, endorses SOB during the last 20 ft of session. Pt endorses no pain and takes x1 standing rest break before sitting. Pt wheeled back to room and lifted back to bed in maxi-sky. Pt left in bed with needs in reach on 2L.   Johnathan Hausen Mobility Specialist 03/31/23, 5:54 PM

## 2023-04-01 DIAGNOSIS — J9621 Acute and chronic respiratory failure with hypoxia: Secondary | ICD-10-CM | POA: Diagnosis not present

## 2023-04-01 DIAGNOSIS — J439 Emphysema, unspecified: Secondary | ICD-10-CM | POA: Diagnosis not present

## 2023-04-01 DIAGNOSIS — N179 Acute kidney failure, unspecified: Secondary | ICD-10-CM | POA: Diagnosis not present

## 2023-04-01 DIAGNOSIS — I5033 Acute on chronic diastolic (congestive) heart failure: Secondary | ICD-10-CM | POA: Diagnosis not present

## 2023-04-01 LAB — CBC
HCT: 37.4 % — ABNORMAL LOW (ref 39.0–52.0)
Hemoglobin: 10.3 g/dL — ABNORMAL LOW (ref 13.0–17.0)
MCH: 21.1 pg — ABNORMAL LOW (ref 26.0–34.0)
MCHC: 27.5 g/dL — ABNORMAL LOW (ref 30.0–36.0)
MCV: 76.5 fL — ABNORMAL LOW (ref 80.0–100.0)
Platelets: 241 10*3/uL (ref 150–400)
RBC: 4.89 MIL/uL (ref 4.22–5.81)
RDW: 18.8 % — ABNORMAL HIGH (ref 11.5–15.5)
WBC: 6.9 10*3/uL (ref 4.0–10.5)
nRBC: 0.3 % — ABNORMAL HIGH (ref 0.0–0.2)

## 2023-04-01 LAB — COMPREHENSIVE METABOLIC PANEL
ALT: 13 U/L (ref 0–44)
AST: 12 U/L — ABNORMAL LOW (ref 15–41)
Albumin: 2.7 g/dL — ABNORMAL LOW (ref 3.5–5.0)
Alkaline Phosphatase: 86 U/L (ref 38–126)
Anion gap: 10 (ref 5–15)
BUN: 30 mg/dL — ABNORMAL HIGH (ref 8–23)
CO2: 29 mmol/L (ref 22–32)
Calcium: 8 mg/dL — ABNORMAL LOW (ref 8.9–10.3)
Chloride: 99 mmol/L (ref 98–111)
Creatinine, Ser: 1.53 mg/dL — ABNORMAL HIGH (ref 0.61–1.24)
GFR, Estimated: 50 mL/min — ABNORMAL LOW (ref >60)
Glucose, Bld: 102 mg/dL — ABNORMAL HIGH (ref 70–99)
Potassium: 3.7 mmol/L (ref 3.5–5.1)
Sodium: 138 mmol/L (ref 135–145)
Total Bilirubin: 0.5 mg/dL (ref <1.2)
Total Protein: 7.4 g/dL (ref 6.5–8.1)

## 2023-04-01 LAB — MAGNESIUM: Magnesium: 2.6 mg/dL — ABNORMAL HIGH (ref 1.7–2.4)

## 2023-04-01 NOTE — Progress Notes (Signed)
Physical Therapy Treatment Patient Details Name: Gary [name] MRN: [id number] DOB: 03-21-1956 Today's Date: 04/01/2023   History of Present Illness 67 y/o male presented to [facility] ED on 11/28/22 for chest pain x 4 days. Admitted for acute on chronic CHF. Frequent admissions this year with most recent dsicharge on 11/21/22. PMH includes obesity, hypoxia, respiratory failure with tracheostomy, COPD, GIB, HFpEF.    PT Comments  Pt was pleasant and motivated to ambulate during the session and put forth good effort with gait.  Pt was able to ambulate close to a full lap around the nurses station with only one brief standing rest break.  Upon returning to sitting pt willing to attempt to stand from the recliner but unwilling to follow PT's recommendation for proper sequencing or to attempt a pull to stand transfer at this time.  Education provided on necessity of building functional strength/power to facilitate improved ability to perform functional transfers at home.  Pt will benefit from continued PT services upon discharge to safely address deficits listed in patient problem list for decreased caregiver assistance and eventual return to PLOF.        If plan is discharge home, recommend the following: A lot of help with bathing/dressing/bathroom;Assist for transportation;A little help with walking and/or transfers   Can travel by private vehicle     No  Equipment Recommendations  Other (comment) (TBD at next venue of care)    Recommendations for Other Services       Precautions / Restrictions Precautions Precautions: Fall Restrictions Weight Bearing Restrictions Per Provider Order: No     Mobility  Bed Mobility Overal bed mobility: Needs Assistance Bed Mobility: Supine to Sit     Supine to sit: Supervision     General bed mobility comments: Min extra time and effort and use of rails only    Transfers Overall transfer level: Needs assistance Equipment used: Rolling walker (2  wheels) Transfers: Sit to/from Stand Sit to Stand: Supervision, From elevated surface           General transfer comment: Pt continues to insist on significantly elevated bed height for transfer despite encouragement to attempt lower height transfers for improved functional strength    Ambulation/Gait Ambulation/Gait assistance: +2 safety/equipment, Contact guard assist Gait Distance (Feet): 140 Feet Assistive device: Rolling walker (2 wheels) Gait Pattern/deviations: Step-through pattern, Decreased stride length Gait velocity: decreased     General Gait Details: Grossly improved cadence with one short standing therapeutifc rest break during ambulation; min verbal cues for general sequencing with the RW but no overt LOB   Stairs             Wheelchair Mobility     Tilt Bed    Modified Rankin (Stroke Patients Only)       Balance Overall balance assessment: Needs assistance Sitting-balance support: No upper extremity supported Sitting balance-[name] Scale: Normal Sitting balance - Comments: unsupported sitting EOB   Standing balance support: During functional activity, Bilateral upper extremity supported Standing balance-[name] Scale: Fair                              Cognition Arousal: Alert Behavior During Therapy: WFL for tasks assessed/performed Overall Cognitive Status: Within Functional Limits for tasks assessed  Exercises Other Exercises Other Exercises: Pt education provided on benefits from sit to/from stand transfer training from progressively lower surfaces as able to improve functional strength    General Comments        Pertinent Vitals/Pain Pain Assessment Pain Assessment: No/denies pain    Home Living                          Prior Function            PT Goals (current goals can now be found in the care plan section) Progress towards PT goals:  Progressing toward goals    Frequency    Min 1X/week      PT Plan Current plan remains appropriate    Co-evaluation              AM-PAC PT "6 Clicks" Mobility   Outcome Measure  Help needed turning from your back to your side while in a flat bed without using bedrails?: None Help needed moving from lying on your back to sitting on the side of a flat bed without using bedrails?: A Little Help needed moving to and from a bed to a chair (including a wheelchair)?: A Lot Help needed standing up from a chair using your arms (e.g., wheelchair or bedside chair)?: A Little Help needed to walk in hospital room?: A Little Help needed climbing 3-5 steps with a railing? : Total 6 Click Score: 16    End of Session Equipment Utilized During Treatment: Oxygen Activity Tolerance: Patient tolerated treatment well Patient left: in chair;with call bell/phone within reach Nurse Communication: Mobility status PT Visit Diagnosis: Muscle weakness (generalized) (M62.81);Difficulty in walking, not elsewhere classified (R26.2)     Time: 7829-5621 PT Time Calculation (min) (ACUTE ONLY): 26 min  Charges:    $Gait Training: 8-22 mins $Therapeutic Activity: 8-22 mins PT General Charges $$ ACUTE PT VISIT: 1 Visit                     D. Scott Rydge [place] PT, DPT 04/01/23, 2:35 PM
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
## 2023-04-01 NOTE — Progress Notes (Signed)
Voice mail left with Gavin Pound sister, as patient requested. Awaiting a callback. Telephone call to [facility], awaiting for Joylene Draft  to call back with update. DCP is to return home and patient is to work with Physical Therapy. Abelino Derrick RN,MHA,CCM [phone]

## 2023-04-01 NOTE — Plan of Care (Signed)

## 2023-04-01 NOTE — Progress Notes (Signed)
Progress Note   Patient: Gary [name] MVH:[id number] DOB: 1955-12-06 DOA: 11/28/2022     122 DOS: the patient was seen and examined on 04/01/2023   Brief hospital course:  Brief Narrative:  Gary [name] is a 67 y.o. 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  Patient initially ran out of torsemide and was found to be in CHF exacerbation.  Eventually became euvolemic and transition to p.o. medications.  Patient has been on trach collar status post course of antibiotics for Pseudomonas tracheitis.  Overall stable on 2 L nasal cannula.  Currently awaiting safe disposition. Difficult to place due to tracheostomy      Assessment and Plan: * Acute on chronic diastolic CHF (congestive heart failure) ([name]) Continue torsemide 40 mg twice daily.  Continue spironolactone.  Creatinine stable at 1.47.  No beta-blocker with respiratory issues.  Acute on chronic respiratory failure with hypoxia and hypercapnia ([name]) Currently on on 2 L.  Continue ventilator at night.  Patient went through prolonged weaning secondary to severe COPD with hypercapnic hypoxic respiratory failure and sleep apnea.  Earlier in the hospital stay was on much more oxygen.  COPD (chronic obstructive pulmonary disease) ([name]) Severe COPD on Combivent, as needed albuterol.   Acute kidney injury superimposed on CKD ([name]) Acute kidney injury on CKD stage IIIa.  Creatinine 1.47 with diuresis.  (On 9/18 did have a creatinine of 1.17).  Creatinine will likely stay higher being on torsemide 40 mg twice a day.  Anxiety On Atarax as needed.  Continue Lexapro  OSA (obstructive sleep apnea) Continue home ventilator at night  Chest pain ACS ruled out.  Hypotension Off midodrine  Dry skin Lac-Hydrin lotion to feet.  Added  antifungal cream to feet.  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  Chronic colitis Continue home mesalamine  Generalized weakness Continue working with physical therapy and mobility specialist to work on getting out of the bed and ambulating.  Continue working on standing from commode and chair.  Continue also doing exercises in the bed.  Morbid obesity with BMI of 50.0-59.9, adult ([name]) BMI 52.22 with last height and weight in computer.         Subjective: Patient states that he is practicing getting up from the commode.  He states he wants to practice getting up from a chair.  Once he does get up he is able to walk but does get tired.  Admitted on 123 days ago with shortness of breath.  Physical Exam: Vitals:   03/31/23 1410 03/31/23 1655 03/31/23 2034 04/01/23 0824  BP:  111/68 114/77 108/76  Pulse:  83 84 83  Resp:  18  16  Temp:  98.7 F (37.1 C) 98.3 F (36.8 C) 97.6 F (36.4 C)  TempSrc:      SpO2: 94% 93% 93% (!) 89%  Weight:      Height:       Physical Exam HENT:     Head: Normocephalic.  Eyes:     General: Lids are normal.     Conjunctiva/sclera: Conjunctivae normal.  Cardiovascular:     Rate and Rhythm: Normal rate and regular rhythm.     Heart sounds: Normal heart sounds, S1 normal and S2 normal.  Pulmonary:     Breath sounds: Examination of the right-lower field reveals decreased breath  sounds. Examination of the left-lower field reveals decreased breath sounds. Decreased breath sounds present. No wheezing or rhonchi.  Abdominal:     Palpations: Abdomen is soft.     Tenderness: There is no abdominal tenderness.  Musculoskeletal:     Right lower leg: Swelling present.     Left lower leg: Swelling present.  Skin:    General: Skin is warm.     Findings: No rash.  Neurological:     Mental Status: He is alert and oriented to person, place, and time.     Data Reviewed: Labs ordered for today but not drawn yet  Disposition: Status is:  Inpatient Remains inpatient appropriate because: Transitional care team trying to set up home health.  Patient still working on his strength.  Planned Discharge Destination: Home with Home Health at some point    Time spent: 26 minutes  Author: Alford Highland, MD 04/01/2023 1:29 PM  For on call review [email or web address].

## 2023-04-01 NOTE — Plan of Care (Signed)
  Problem: Education: Goal: Knowledge of General Education information will improve Description: Including pain rating scale, medication(s)/side effects and non-pharmacologic comfort measures Outcome: Progressing   Problem: Clinical Measurements: Goal: Ability to maintain clinical measurements within normal limits will improve Outcome: Progressing Goal: Will remain free from infection Outcome: Progressing Goal: Diagnostic test results will improve Outcome: Progressing Goal: Respiratory complications will improve Outcome: Progressing   

## 2023-04-02 DIAGNOSIS — J9621 Acute and chronic respiratory failure with hypoxia: Secondary | ICD-10-CM | POA: Diagnosis not present

## 2023-04-02 DIAGNOSIS — J439 Emphysema, unspecified: Secondary | ICD-10-CM | POA: Diagnosis not present

## 2023-04-02 DIAGNOSIS — I5033 Acute on chronic diastolic (congestive) heart failure: Secondary | ICD-10-CM | POA: Diagnosis not present

## 2023-04-02 DIAGNOSIS — N179 Acute kidney failure, unspecified: Secondary | ICD-10-CM | POA: Diagnosis not present

## 2023-04-02 NOTE — Plan of Care (Signed)
  Problem: Nutrition: Goal: Adequate nutrition will be maintained Outcome: Progressing   Problem: Elimination: Goal: Will not experience complications related to bowel motility Outcome: Progressing Goal: Will not experience complications related to urinary retention Outcome: Progressing   Problem: Pain Managment: Goal: General experience of comfort will improve Outcome: Progressing   Problem: Safety: Goal: Ability to remain free from injury will improve Outcome: Progressing   Problem: Skin Integrity: Goal: Risk for impaired skin integrity will decrease Outcome: Progressing   

## 2023-04-02 NOTE — Progress Notes (Signed)
Progress Note   Patient: Gary [name]:[id number] DOB: 1955/10/25 DOA: 11/28/2022     123 DOS: the patient was seen and examined on 04/02/2023   Brief hospital course:  Brief Narrative:  Gary [name] is a 67 y.o. 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  Patient initially ran out of torsemide and was found to be in CHF exacerbation.  Eventually became euvolemic and transition to p.o. medications.  Patient has been on trach collar status post course of antibiotics for Pseudomonas tracheitis.  Overall stable on 2 L nasal cannula.  Currently awaiting safe disposition. Difficult to place due to tracheostomy      Assessment and Plan: * Acute on chronic diastolic CHF (congestive heart failure) ([name]) Continue torsemide 40 mg twice daily.  Continue spironolactone.  Creatinine stable at 1.53.  No beta-blocker with respiratory issues.  Acute on chronic respiratory failure with hypoxia and hypercapnia ([name]) Currently on on 2 L.  Continue ventilator at night.  Patient went through prolonged weaning secondary to severe COPD with hypercapnic hypoxic respiratory failure and sleep apnea.  Earlier in the hospital stay was on much more oxygen.  COPD (chronic obstructive pulmonary disease) ([name]) Severe COPD on Combivent, as needed albuterol.   Acute kidney injury superimposed on CKD ([name]) Acute kidney injury on CKD stage IIIa.  Creatinine 1.53 with diuresis.  (On 9/18 did have a creatinine of 1.17).  Creatinine will likely stay higher being on torsemide 40 mg twice a day.  Anxiety On Atarax as needed.  Continue Lexapro  OSA (obstructive sleep apnea) Continue home ventilator at night  Chest pain ACS ruled out.  Hypotension Off midodrine  Dry skin Lac-Hydrin lotion to feet.  Added  antifungal cream to feet.  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  Chronic colitis Continue home mesalamine  Generalized weakness Continue working with physical therapy and mobility specialist to work on getting out of the bed and ambulating.  Continue working on standing from commode and chair.  Continue also doing exercises in the bed.  Spoke with transitional care team and continue to enforce building up strength for discharge on 04/18/23.  Morbid obesity with BMI of 50.0-59.9, adult ([name]) BMI 52.22 with last height and weight in computer.         Subjective: Patient working with mobility specialist to walk around the nursing station.  Made a three quarters around the nursing station and needed to sit down to catch his breath.  Patient continuing to work with physical therapy.  Physical Exam: Vitals:   04/01/23 0824 04/01/23 2004 04/02/23 0343 04/02/23 0816  BP: 108/76 129/78 122/88 113/82  Pulse: 83 83 80 83  Resp: 16 18 19 17   Temp: 97.6 F (36.4 C) 97.8 F (36.6 C) 98.3 F (36.8 C) 97.8 F (36.6 C)  TempSrc:  Oral    SpO2: (!) 89%  96% 94%  Weight:      Height:       Physical Exam HENT:     Head: Normocephalic.  Eyes:     General: Lids are normal.     Conjunctiva/sclera: Conjunctivae normal.  Cardiovascular:     Rate and Rhythm: Normal rate and regular rhythm.     Heart sounds: Normal heart sounds, S1 normal and S2 normal.  Pulmonary:     Breath  sounds: Examination of the right-lower field reveals decreased breath sounds. Examination of the left-lower field reveals decreased breath sounds. Decreased breath sounds present. No wheezing or rhonchi.  Abdominal:     Palpations: Abdomen is soft.     Tenderness: There is no abdominal tenderness.  Musculoskeletal:     Right lower leg: Swelling present.     Left lower leg: Swelling present.  Skin:    General: Skin is warm.     Findings: No rash.  Neurological:     Mental Status: He is alert and oriented to  person, place, and time.     Data Reviewed: Creatinine 1.53 with a GFR 50  Disposition: Status is: Inpatient Remains inpatient appropriate because: Patient wanting to work in the hospital with physical therapy and mobility specialist and hopefully go home with home health on 04/18/23.  Planned Discharge Destination: Home with Home Health    Time spent: 26 minutes  Author: Alford Highland, MD 04/02/2023 11:34 AM  For on call review [email or web address].

## 2023-04-02 NOTE — Progress Notes (Signed)
Mobility Specialist - Progress Note   During mobility: HR 106, SpO2 91% Post-mobility: HR 104, SPO2 100%   04/02/23 1031  Mobility  Activity Ambulated with assistance in hallway;Stood at bedside  Level of Assistance Standby assist, set-up cues, supervision of patient - no hands on  Assistive Device Front wheel walker  Distance Ambulated (ft) 160 ft  Activity Response Tolerated well  Mobility visit 1 Mobility  Mobility Specialist Start Time (ACUTE ONLY) 0930  Mobility Specialist Stop Time (ACUTE ONLY) 1008  Mobility Specialist Time Calculation (min) (ACUTE ONLY) 38 min   Pt supine upon entry, utilizing 2L. Pt denied MS plan for mobility this date, opting to self regulate session, abruptly exiting the bed and telling MS "I'm going to walk as far as I can, sit in the chair and try to stand up from the chair". Pt completed bed mob ModI, STS to RW and amb one lap around the NS with supervision-- stopping once at ~80 ft for a seated rest break, O2 86% RA, placed on 2L  O2 >90%. While seated Pt educated on importance of energy conservation in relation to mobility. Pt STS from recliner ModA +2, VC's to pace activity during transfer and to practice pursed lip breathing during amb. Pt amb an additional 40 ft before sitting for a final rest break, wheeled into the room. Once in the room, Pt STS from the recliner MaxA-ModA +2, requiring more assist to initiate STS d/t weakness and fatigue. Pt transferred to bed, left in fowler with needs within reach.  Zetta Bills Mobility Specialist 04/02/23 11:19 AM

## 2023-04-02 NOTE — Progress Notes (Signed)
Occupational Therapy Treatment Patient Details Name: Gary [name] MRN: [id number] DOB: [birth year] Today's Date: 04/02/2023   History of present illness 67 y/o male presented to [facility] ED on 11/28/22 for chest pain x 4 days. Admitted for acute on chronic CHF. Frequent admissions this year with most recent dsicharge on 11/21/22. PMH includes obesity, hypoxia, respiratory failure with tracheostomy, COPD, GIB, HFpEF.   OT comments  Gary [name] was seen for OT treatment on this date. Upon arrival to room pt in bed, agreeable to tx. Pt reports fatigue from walk and multiple standing trials this AM but eager to continue progressing functional mobility. Pt requires no asssit for bed mobility or standing from elevated bed, SBA +RW for ~120 ft with 2 standing rest breaks. Educated on DME recs and HEP. Pt making good progress toward goals, will continue to follow POC. Discharge recommendation remains appropriate.        If plan is discharge home, recommend the following:  A little help with walking and/or transfers;A lot of help with bathing/dressing/bathroom   Equipment Recommendations  BSC/3in1    Recommendations for Other Services      Precautions / Restrictions Precautions Precautions: Fall Restrictions Weight Bearing Restrictions Per Provider Order: No       Mobility Bed Mobility Overal bed mobility: Needs Assistance Bed Mobility: Supine to Sit     Supine to sit: Supervision          Transfers Overall transfer level: Needs assistance Equipment used: Rolling walker (2 wheels) Transfers: Sit to/from Stand Sit to Stand: Supervision, From elevated surface                 Balance Overall balance assessment: Needs assistance Sitting-balance support: No upper extremity supported Sitting balance-[name] Scale: Normal     Standing balance support: Single extremity supported, During functional activity Standing balance-[name] Scale: Fair                              ADL either performed or assessed with clinical judgement   ADL Overall ADL's : Needs assistance/impaired                                       General ADL Comments: MAX A don B shoes in sitting      Cognition Arousal: Alert Behavior During Therapy: WFL for tasks assessed/performed Overall Cognitive Status: Within Functional Limits for tasks assessed                                                     Pertinent Vitals/ Pain       Pain Assessment Pain Assessment: No/denies pain   Frequency  Min 1X/week        Progress Toward Goals  OT Goals(current goals can now be found in the care plan section)  Progress towards OT goals: Progressing toward goals  Acute Rehab OT Goals Patient Stated Goal: to go home OT Goal Formulation: With patient Time For Goal Achievement: 04/30/23 Potential to Achieve Goals: Good ADL Goals Pt Will Perform Grooming: with modified independence;standing Pt Will Transfer to Toilet: with modified independence;ambulating;regular height toilet Pt Will Perform Toileting - Clothing Manipulation and hygiene: with mod assist;sit to/from stand  Plan  Discharge plan remains appropriate;Frequency remains appropriate    Co-evaluation                 AM-PAC OT "6 Clicks" Daily Activity     Outcome Measure   Help from another person eating meals?: None Help from another person taking care of personal grooming?: A Little Help from another person toileting, which includes using toliet, bedpan, or urinal?: A Lot Help from another person bathing (including washing, rinsing, drying)?: A Lot Help from another person to put on and taking off regular upper body clothing?: None Help from another person to put on and taking off regular lower body clothing?: A Lot 6 Click Score: 17    End of Session Equipment Utilized During Treatment: Rolling walker (2 wheels);Oxygen  OT Visit Diagnosis: Unsteadiness on feet  (R26.81);Other abnormalities of gait and mobility (R26.89);Muscle weakness (generalized) (M62.81);Adult, failure to thrive (R62.7)   Activity Tolerance Patient tolerated treatment well   Patient Left in chair;with call bell/phone within reach   Nurse Communication          Time: 7829-5621 OT Time Calculation (min): 20 min  Charges: OT General Charges $OT Visit: 1 Visit OT Treatments $Therapeutic Activity: 8-22 mins  Kathie Dike, M.S. OTR/L  04/02/23, 1:40 PM  ascom [phone]
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
## 2023-04-02 NOTE — Progress Notes (Signed)
Long talk with patient concerning discharge date. Patient wants to remain in the hospital for one month for rehab. CM explained to patient that the hospital is not a place for Rehab, he is medically stable for discharge and his rehabilitation will continue at home. Patient is up with Physical Therapy ambulating to nursing station.  Also informed patient that once patient is medically stable for discharge, insurance will stop payment due to lack of medically necessity. HHC has been arranged, Referral to [facility] - they will contact the patient when he is at home, all DME is at his home. Lots of encouragement given to patient. If Attending MD is in agreement discharge date set for 04/18/23. Jeral Pinch Transition of Care Supervisor [phone]

## 2023-04-03 DIAGNOSIS — I5033 Acute on chronic diastolic (congestive) heart failure: Secondary | ICD-10-CM | POA: Diagnosis not present
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
## 2023-04-03 NOTE — Care Management Important Message (Signed)
Important Message  Patient Details  Name: Gary [name] MRN: [id number] Date of Birth: 12-01-1955   Important Message Given:  Yes - Medicare IM     Coby Shrewsberry, [name] 04/03/2023, 2:30 PM

## 2023-04-03 NOTE — Progress Notes (Signed)
PROGRESS NOTE    Gary [name]  WUJ:[id number] DOB: [birth year] DOA: 11/28/2022 PCP: Shayne Alken, MD   Assessment & Plan:   Principal Problem:   Acute on chronic diastolic CHF (congestive heart failure) ([name]) Active Problems:   Acute on chronic respiratory failure with hypoxia and hypercapnia ([name])   COPD (chronic obstructive pulmonary disease) ([name])   Acute kidney injury superimposed on CKD ([name])   OSA (obstructive sleep apnea)   Anxiety   Hypotension   Chest pain   Morbid obesity with BMI of 50.0-59.9, adult ([name])   Generalized weakness   Chronic colitis   Fluid overload   Eyelid cyst, right   Dry skin   Tracheitis  Assessment and Plan: Difficult to place due to tracheostomy and weight: continue w/ supportive care. See CM's notes.    Acute on chronic diastolic NFA:[id number] admitted for volume overloaded. Appears euvolemic currently. Continue on torsemide, aldactone.    Atypical chest pain: resolved    Acute on chronic hypoxic & hypercapnic respiratory failure: continue on trach collar. S/p abx course for pseudomonas tracheitis. Also evaluated for non-massive hemoptysis with CT soft tissue of neck done on 02/08/2023.  Tracheostomy tube is in place and there is no definite collection around the tube.  There is thinning of the wall between the esophagus and trachea with possible defect. Stable currently on 2L [place]   COPD: severe. Bronchodilators prn     AKI on CKDIIIa: Cr is labile. Avoid nephrotoxic meds    OSA: CPAP qhs   Right eyelid cyst: lanced by ophthalmology    Chronic colitis: continue w/ supportive care. No longer taking mesalamine    Generalized weakness: PT/OT continues to work with the pt. Pt has been more motivated and working with physical therapy and mobility specialist, however, resistant to certain important exercises such as getting up from seated position to standing.   Depression & anxiety: severity unknown. Continue on lexapro     Morbid obesity: BMI 52.1. Complicates overall care & prognosis    Constipation: lactulose prn       DVT prophylaxis: eliquis  Code Status: full  Family Communication: Disposition Plan: unclear. Unsafe d/c plan   Level of care: Med-Surg  Status is: Inpatient Remains inpatient appropriate because: unsafe d/c plans. See CM notes    Consultants:  Pulmon   Procedures:   Antimicrobials:   Subjective: Pt c/o fatigue   Objective: Vitals:   04/02/23 1633 04/02/23 2102 04/02/23 2300 04/03/23 0437  BP: 120/81 121/84  105/80  Pulse: 85 82 81 80  Resp: 16 18  18   Temp: 98.5 F (36.9 C) 98.2 F (36.8 C)  97.9 F (36.6 C)  TempSrc:      SpO2: 96% 95% 95% 96%  Weight:      Height:        Intake/Output Summary (Last 24 hours) at 04/03/2023 0825 Last data filed at 04/03/2023 0300 Gross per 24 hour  Intake 240 ml  Output 1175 ml  Net -935 ml   Filed Weights   03/24/23 0500 03/25/23 0353 03/29/23 0500  Weight: (!) 193.4 kg (!) 194.5 kg (!) 194.6 kg    Examination:  General exam: Appears calm and comfortable.Morbid obese Respiratory system: decreased breath sounds  Cardiovascular system: S1 & S2+. No rubs, gallops or clicks.  Gastrointestinal system: Abdomen is obese,  soft and nontender. Normal bowel sounds heard. Central nervous system: Alert and oriented.  Psychiatry: Judgement and insight appear normal. Flat mood and affect    Data  Reviewed: I have personally reviewed following labs and imaging studies  CBC: Recent Labs  Lab 04/01/23 1225  WBC 6.9  HGB 10.3*  HCT 37.4*  MCV 76.5*  PLT 241   Basic Metabolic Panel: Recent Labs  Lab 04/01/23 1225 04/01/23 1227  NA  --  138  K  --  3.7  CL  --  99  CO2  --  29  GLUCOSE  --  102*  BUN  --  30*  CREATININE  --  1.53*  CALCIUM  --  8.0*  MG 2.6*  --    GFR: Estimated Creatinine Clearance: 88.4 mL/min (A) (by C-G formula based on SCr of 1.53 mg/dL (H)). Liver Function Tests: Recent Labs   Lab 04/01/23 1227  AST 12*  ALT 13  ALKPHOS 86  BILITOT 0.5  PROT 7.4  ALBUMIN 2.7*   No results for input(s): "LIPASE", "AMYLASE" in the last 168 hours. No results for input(s): "AMMONIA" in the last 168 hours. Coagulation Profile: No results for input(s): "INR", "PROTIME" in the last 168 hours. Cardiac Enzymes: No results for input(s): "CKTOTAL", "CKMB", "CKMBINDEX", "TROPONINI" in the last 168 hours. BNP (last 3 results) No results for input(s): "PROBNP" in the last 8760 hours. HbA1C: No results for input(s): "HGBA1C" in the last 72 hours. CBG: No results for input(s): "GLUCAP" in the last 168 hours. Lipid Profile: No results for input(s): "CHOL", "HDL", "LDLCALC", "TRIG", "CHOLHDL", "LDLDIRECT" in the last 72 hours. Thyroid Function Tests: No results for input(s): "TSH", "T4TOTAL", "FREET4", "T3FREE", "THYROIDAB" in the last 72 hours. Anemia Panel: No results for input(s): "VITAMINB12", "FOLATE", "FERRITIN", "TIBC", "IRON", "RETICCTPCT" in the last 72 hours. Sepsis Labs: No results for input(s): "PROCALCITON", "LATICACIDVEN" in the last 168 hours.  No results found for this or any previous visit (from the past 240 hours).       Radiology Studies: No results found.      Scheduled Meds:  amphetamine-dextroamphetamine  30 mg Oral Q breakfast   apixaban  2.5 mg Oral BID   bacitracin   Topical Daily   bisacodyl  10 mg Oral Daily   clotrimazole   Topical BID   docusate sodium  100 mg Oral BID   escitalopram  10 mg Oral Daily   ferrous sulfate  325 mg Oral Daily   Gerhardt's butt cream   Topical Daily   guaiFENesin  1,200 mg Oral BID   Ipratropium-Albuterol  1 puff Inhalation Q6H   polyethylene glycol  17 g Oral BID   spironolactone  25 mg Oral Daily   torsemide  40 mg Oral BID   Continuous Infusions:   LOS: 124 days       Charise Killian, MD [facility] Pager [phone]  If 7PM-7AM, please contact  night-coverage [email or web address] 04/03/2023, 8:25 AM

## 2023-04-03 NOTE — Progress Notes (Signed)
Physical Therapy Treatment Patient Details Name: Gary [name] MRN: [id number] DOB: 05/31/1955 Today's Date: 04/03/2023   History of Present Illness 68 y/o male presented to [facility] ED on 11/28/22 for chest pain x 4 days. Admitted for acute on chronic CHF. Frequent admissions this year with most recent dsicharge on 11/21/22. PMH includes obesity, hypoxia, respiratory failure with tracheostomy, COPD, GIB, HFpEF.    PT Comments  Pt was long sitting in bed upon arrival. He is A and Ox 4 and cooperative throughout. " Im getting better. Im getting closer to going home." Pt does continue to like to dictate session progression however puts forth great effort. He was able to exit bed without physical assistance (uses bed controls and bed rails), stand to BRW without assistance from elevated bed height. He tolerates gait ~ 120 ft prior to seated rest. Pt was on 2L o2 [place] but demands to be on 4 L o2 [place] during activity/exertion. Session progressed to standing from w/c and ambulating ~ 80 more ft with BRW. Pt stood 2 x from w/c (w/c cushion +1 pillow to elevated surface height). On 2nd attempt, he has BM. Author assisted with hygiene care in standing prior to pt returning to bed. Overall pt tolerated session well and is progressing towards all PT goals. Acute PT will continue to follow per current POC.      If plan is discharge home, recommend the following: A lot of help with bathing/dressing/bathroom;Assist for transportation;A lot of help with walking and/or transfers;Direct supervision/assist for medications management;Direct supervision/assist for financial management;Help with stairs or ramp for entrance;Supervision due to cognitive status   Can travel by private vehicle     No  Equipment Recommendations  Other (comment)       Precautions / Restrictions Precautions Precautions: Fall Precaution Comments: capped trach (2 L O2 [place]) Restrictions Weight Bearing Restrictions Per Provider Order: No      Mobility  Bed Mobility Overal bed mobility: Modified Independent Bed Mobility: Supine to Sit, Sit to Supine Rolling: Supervision Supine to sit: Supervision Sit to supine: Mod assist General bed mobility comments: pt continues to require alot of assistance to progress BLES into bed from EOB sitting.    Transfers Overall transfer level: Needs assistance Equipment used: Rolling walker (2 wheels) Transfers: Sit to/from Stand Sit to Stand: Supervision, From elevated surface, Min assist, +2 physical assistance, +2 safety/equipment, Mod assist  General transfer comment: pt stood from elevated bed height without physical assistance. He does however require +2 min-mod assistance to stand from w/c (w/c cushion + 1 pilow under him)    Ambulation/Gait Ambulation/Gait assistance: +2 safety/equipment, Supervision Gait Distance (Feet): 120 Feet Assistive device: Rolling walker (2 wheels) (Bariatric) Gait Pattern/deviations: WFL(Within Functional Limits), Step-through pattern Gait velocity: decreased  General Gait Details: Pt ambulated 1 x 120 and 1 x ~ 80. w/c follow for safety. pt on 4L [place]. Prolonged seated rest between gait trials.   Balance Overall balance assessment: Needs assistance Sitting-balance support: No upper extremity supported Sitting balance-[name] Scale: Normal     Standing balance support: Bilateral upper extremity supported, During functional activity, Reliant on assistive device for balance Standing balance-[name] Scale: Fair       Cognition Arousal: Alert Behavior During Therapy: WFL for tasks assessed/performed Overall Cognitive Status: Within Functional Limits for tasks assessed    General Comments: Pt conintues to like to dicatate session progression but overall was motivated and cooperative               Pertinent  Vitals/Pain Pain Assessment Pain Assessment: 0-10 Pain Score: 2  Pain Location: L calf Pain Descriptors / Indicators: Sore     PT Goals  (current goals can now be found in the care plan section) Acute Rehab PT Goals Patient Stated Goal: get stronger and walk better before I go home Progress towards PT goals: Progressing toward goals    Frequency    Min 1X/week      PT Plan Current plan remains appropriate    Co-evaluation     PT goals addressed during session: Mobility/safety with mobility;Balance;Proper use of DME;Strengthening/ROM        AM-PAC PT "6 Clicks" Mobility   Outcome Measure  Help needed turning from your back to your side while in a flat bed without using bedrails?: None Help needed moving from lying on your back to sitting on the side of a flat bed without using bedrails?: A Little Help needed moving to and from a bed to a chair (including a wheelchair)?: A Lot Help needed standing up from a chair using your arms (e.g., wheelchair or bedside chair)?: A Lot Help needed to walk in hospital room?: A Lot Help needed climbing 3-5 steps with a railing? : A Lot 6 Click Score: 15    End of Session Equipment Utilized During Treatment: Oxygen (2L at rest, pt demansd 4L with exercise and activity) Activity Tolerance: Patient tolerated treatment well;Patient limited by fatigue Patient left: in bed;with call bell/phone within reach Nurse Communication: Mobility status PT Visit Diagnosis: Muscle weakness (generalized) (M62.81);Difficulty in walking, not elsewhere classified (R26.2)     Time: 0272-5366 PT Time Calculation (min) (ACUTE ONLY): 35 min  Charges:    $Gait Training: 8-22 mins $Therapeutic Activity: 8-22 mins PT General Charges $$ ACUTE PT VISIT: 1 Visit                    Jetta Lout PTA 04/03/23, 12:20 PM

## 2023-04-04 DIAGNOSIS — I5033 Acute on chronic diastolic (congestive) heart failure: Secondary | ICD-10-CM | POA: Diagnosis not present

## 2023-04-04 NOTE — Progress Notes (Signed)
Mobility Specialist - Progress Note  During mobility: HR 99, BP, SpO2 95% Post-mobility: HR 105, SPO2 90%   04/04/23 1150  Mobility  Activity Ambulated with assistance in hallway  Level of Assistance Standby assist, set-up cues, supervision of patient - no hands on  Assistive Device Front wheel walker  Distance Ambulated (ft) 60 ft  Activity Response Tolerated well  Mobility visit 1 Mobility  Mobility Specialist Start Time (ACUTE ONLY) 0944  Mobility Specialist Stop Time (ACUTE ONLY) 1029  Mobility Specialist Time Calculation (min) (ACUTE ONLY) 45 min   Pt supine upon entry, utilizing RA. Pt completed bed mob ModI, STS to RW and amb 60 ft in the hallway supervision--- no chair follow, chair placed at ~ 20 ft in the hallway. During amb Pt expressed bilateral knee soreness, stopping at 30 ft for a seated rest break-- heavy BUE weight bearing through the RW with increased distance, educated on energy conservation. Pt STS from recliner ModA +2 with max effort for Pt and cuing to bring BLE closer to person upon standing, amb to the room and returned to bed-- mild dyspnea upon return to bed (O2 <90%). Pt left in fowler position with needs within reach.  Zetta Bills Mobility Specialist 04/04/23 12:26 PM

## 2023-04-04 NOTE — Progress Notes (Signed)
PROGRESS NOTE    Gary [name]  BMW:[id number] DOB: 1955/12/08 DOA: 11/28/2022 PCP: Shayne Alken, MD   Assessment & Plan:   Principal Problem:   Acute on chronic diastolic CHF (congestive heart failure) ([name]) Active Problems:   Acute on chronic respiratory failure with hypoxia and hypercapnia ([name])   COPD (chronic obstructive pulmonary disease) ([name])   Acute kidney injury superimposed on CKD ([name])   OSA (obstructive sleep apnea)   Anxiety   Hypotension   Chest pain   Morbid obesity with BMI of 50.0-59.9, adult ([name])   Generalized weakness   Chronic colitis   Fluid overload   Eyelid cyst, right   Dry skin   Tracheitis  Assessment and Plan: Difficult to place due to tracheostomy and weight: continue w/ supportive care. See CM's notes.    Acute on chronic diastolic UVO:[id number] admitted for volume overloaded. Appears euvolemic currently. Continue on aldactone, torsemide    Atypical chest pain: resolved    Acute on chronic hypoxic & hypercapnic respiratory failure: continue on trach collar. S/p abx course for pseudomonas tracheitis. Also evaluated for non-massive hemoptysis with CT soft tissue of neck done on 02/08/2023.  Tracheostomy tube is in place and there is no definite collection around the tube.  There is thinning of the wall between the esophagus and trachea with possible defect. Stable currently on 2L [place]   COPD: severe. Bronchodilators prn    AKI on CKDIIIa: Cr is labile. Avoid nephrotoxic meds    OSA: CPAP qhs   Right eyelid cyst: lanced by ophthalmology    Chronic colitis: continue w/ supportive care. No longer taking mesalamine    Generalized weakness: PT/OT continues to work with the pt. Pt has been more motivated and working with physical therapy and mobility specialist, however, resistant to certain important exercises such as getting up from seated position to standing.   Depression & anxiety: severity unknown. Continue on lexapro    Morbid  obesity: BMI 52.1. Complicates overall care & prognosis    Constipation: lactulose prn       DVT prophylaxis: eliquis  Code Status: full  Family Communication: Disposition Plan: unclear. Unsafe d/c plan    Level of care: Med-Surg  Status is: Inpatient Remains inpatient appropriate because: unsafe d/c plans. See CM notes    Consultants:  Pulmon   Procedures:   Antimicrobials:   Subjective: Pt c/o says he needs to ambulate better   Objective: Vitals:   04/03/23 2112 04/03/23 2316 04/04/23 0506 04/04/23 0734  BP: 130/83  117/89 105/72  Pulse: 84 83 79 76  Resp: 20  18 20   Temp: 98.8 F (37.1 C)  98.2 F (36.8 C) 97.6 F (36.4 C)  TempSrc: Oral     SpO2: 94% 95% 96% 94%  Weight:   (!) 193.8 kg   Height:        Intake/Output Summary (Last 24 hours) at 04/04/2023 0827 Last data filed at 04/03/2023 2100 Gross per 24 hour  Intake 240 ml  Output 475 ml  Net -235 ml   Filed Weights   03/25/23 0353 03/29/23 0500 04/04/23 0506  Weight: (!) 194.5 kg (!) 194.6 kg (!) 193.8 kg    Examination:  General exam: Appears calm and comfortable. Morbidly obese  Respiratory system: diminished breath sounds b/l Cardiovascular system: S1/S2+  Gastrointestinal system: Abd is soft, NT, obese & hypoactive bowel sounds  Central nervous system: alert & oriented  Psychiatry: Judgement and insight appears poor. Flat mood and affect  Data Reviewed: I have personally reviewed following labs and imaging studies  CBC: Recent Labs  Lab 04/01/23 1225  WBC 6.9  HGB 10.3*  HCT 37.4*  MCV 76.5*  PLT 241   Basic Metabolic Panel: Recent Labs  Lab 04/01/23 1225 04/01/23 1227  NA  --  138  K  --  3.7  CL  --  99  CO2  --  29  GLUCOSE  --  102*  BUN  --  30*  CREATININE  --  1.53*  CALCIUM  --  8.0*  MG 2.6*  --    GFR: Estimated Creatinine Clearance: 88.2 mL/min (A) (by C-G formula based on SCr of 1.53 mg/dL (H)). Liver Function Tests: Recent Labs  Lab  04/01/23 1227  AST 12*  ALT 13  ALKPHOS 86  BILITOT 0.5  PROT 7.4  ALBUMIN 2.7*   No results for input(s): "LIPASE", "AMYLASE" in the last 168 hours. No results for input(s): "AMMONIA" in the last 168 hours. Coagulation Profile: No results for input(s): "INR", "PROTIME" in the last 168 hours. Cardiac Enzymes: No results for input(s): "CKTOTAL", "CKMB", "CKMBINDEX", "TROPONINI" in the last 168 hours. BNP (last 3 results) No results for input(s): "PROBNP" in the last 8760 hours. HbA1C: No results for input(s): "HGBA1C" in the last 72 hours. CBG: No results for input(s): "GLUCAP" in the last 168 hours. Lipid Profile: No results for input(s): "CHOL", "HDL", "LDLCALC", "TRIG", "CHOLHDL", "LDLDIRECT" in the last 72 hours. Thyroid Function Tests: No results for input(s): "TSH", "T4TOTAL", "FREET4", "T3FREE", "THYROIDAB" in the last 72 hours. Anemia Panel: No results for input(s): "VITAMINB12", "FOLATE", "FERRITIN", "TIBC", "IRON", "RETICCTPCT" in the last 72 hours. Sepsis Labs: No results for input(s): "PROCALCITON", "LATICACIDVEN" in the last 168 hours.  No results found for this or any previous visit (from the past 240 hours).       Radiology Studies: No results found.      Scheduled Meds:  amphetamine-dextroamphetamine  30 mg Oral Q breakfast   apixaban  2.5 mg Oral BID   bacitracin   Topical Daily   bisacodyl  10 mg Oral Daily   clotrimazole   Topical BID   docusate sodium  100 mg Oral BID   escitalopram  10 mg Oral Daily   ferrous sulfate  325 mg Oral Daily   Gerhardt's butt cream   Topical Daily   guaiFENesin  1,200 mg Oral BID   Ipratropium-Albuterol  1 puff Inhalation Q6H   polyethylene glycol  17 g Oral BID   spironolactone  25 mg Oral Daily   torsemide  40 mg Oral BID   Continuous Infusions:   LOS: 125 days       Charise Killian, MD [facility] Pager [phone]  If 7PM-7AM, please contact night-coverage [email or web address] 04/04/2023,  8:27 AM

## 2023-04-04 NOTE — Plan of Care (Signed)
  Problem: Clinical Measurements: Goal: Ability to maintain clinical measurements within normal limits will improve Outcome: Progressing Goal: Will remain free from infection Outcome: Progressing Goal: Diagnostic test results will improve Outcome: Progressing Goal: Respiratory complications will improve Outcome: Progressing Goal: Cardiovascular complication will be avoided Outcome: Progressing   Problem: Activity: Goal: Risk for activity intolerance will decrease Outcome: Progressing   Problem: Education: Goal: Knowledge of General Education information will improve Description: Including pain rating scale, medication(s)/side effects and non-pharmacologic comfort measures Outcome: Progressing   Problem: Elimination: Goal: Will not experience complications related to bowel motility Outcome: Progressing Goal: Will not experience complications related to urinary retention Outcome: Progressing   Problem: Pain Managment: Goal: General experience of comfort will improve Outcome: Progressing
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
## 2023-04-04 NOTE — TOC Progression Note (Signed)
Transition of Care [facility]) - Progression Note    Patient Details  Name: Gary [name] MRN: [id number] Date of Birth: 06-23-1955  Transition of Care [facility]) CM/SW Contact  Garret Reddish, RN Phone Number: 04/04/2023, 10:58 AM  Clinical Narrative:    Chart reviewed.  Patient working with PT/OT to prepare for discharging home. Noted from last PT/OT note patient walked 120 feet with gait trail and then 80 feet with gait trail.  Patient did have to take extended periods of rest.  Working to gain strength to optimize plans for discharge home.  Patient on 4L of 02 while working with PT.    TOC will continue to follow for discharge planning.    Expected Discharge Plan: Home w Home Health Services Barriers to Discharge: Continued Medical Work up  Expected Discharge Plan and Services     Post Acute Care Choice: Home Health   Expected Discharge Date: 02/26/23               DME Arranged: Bedside commode DME Agency: [employer] Date DME Agency Contacted: 01/12/23 Time DME Agency Contacted: 1427 Representative spoke with at DME Agency: [place] HH Arranged: PT, OT, RN HH Agency: [facility] Date [facility] Agency Contacted: 03/28/23 Time HH Agency Contacted: 1428 Representative spoke with at [facility] Agency: [place]   Social Determinants of Health (SDOH) Interventions SDOH Screenings   Food Insecurity: No Food Insecurity (12/01/2022)  Housing: Patient Declined (12/01/2022)  Transportation Needs: No Transportation Needs (12/01/2022)  Utilities: Not At Risk (12/01/2022)  Depression (PHQ2-9): Low Risk  (06/07/2021)  Financial Resource Strain: High Risk (11/21/2022)   Received from [facility]  Physical Activity: Insufficiently Active (05/03/2017)  Social Connections: Patient Declined (11/20/2022)   Received from [facility]  Stress: Stress Concern Present (11/20/2022)   Received from [facility]  Tobacco Use: Medium Risk (02/13/2023)    Readmission Risk Interventions    03/19/2023     9:58 AM 11/05/2022    2:59 PM 11/05/2022   12:16 PM  Readmission Risk Prevention Plan  Transportation Screening Complete Complete Complete  Medication Review Oceanographer) Complete Complete Complete  PCP or Specialist appointment within 3-5 days of discharge Complete Complete Complete  HRI or Home Care Consult Complete Complete Complete  SW Recovery Care/Counseling Consult Complete Complete Complete  Palliative Care Screening Complete Complete Complete  Skilled Nursing Facility Not Applicable Not Applicable Not Applicable

## 2023-04-05 DIAGNOSIS — I5033 Acute on chronic diastolic (congestive) heart failure: Secondary | ICD-10-CM | POA: Diagnosis not present

## 2023-04-05 NOTE — Progress Notes (Signed)
Mobility Specialist - Progress Note   04/05/23 1154  Mobility  Activity Ambulated with assistance in room;Stood at bedside  Level of Assistance Standby assist, set-up cues, supervision of patient - no hands on  Assistive Device Front wheel walker  Distance Ambulated (ft) 24 ft  Range of Motion/Exercises Active;Right leg;Left leg  Activity Response Tolerated well  Mobility visit 1 Mobility  Mobility Specialist Start Time (ACUTE ONLY) 1031  Mobility Specialist Stop Time (ACUTE ONLY) 1057  Mobility Specialist Time Calculation (min) (ACUTE ONLY) 26 min   Pt supine upon entry, utilizing RA. Pt completed bed mob ModI, STS to RW and amb to the sink in room with supervision. Pt stood at sink for ~2 mins indep shaving his face, intermittently resting BUE on the RW d/t back pain when standing. Pt opted to return EOB d/t feeling of "something stuck in my throat" expressing " I feel like I can't really breath". Pt returned EOB, practiced STS to RW from EOB x2-- completing standing marches for 30 seconds, bringing BLE ~ 2 feet off the ground. Pt returned EOB, left in fowler position with alarm set and needs within reach.  Zetta Bills Mobility Specialist 04/05/23 12:02 PM

## 2023-04-05 NOTE — Plan of Care (Signed)

## 2023-04-05 NOTE — Progress Notes (Signed)
PROGRESS NOTE    Gary [name]  FAO:[id number] DOB: 27-Sep-1955 DOA: 11/28/2022 PCP: Shayne Alken, MD   Assessment & Plan:   Principal Problem:   Acute on chronic diastolic CHF (congestive heart failure) ([name]) Active Problems:   Acute on chronic respiratory failure with hypoxia and hypercapnia ([name])   COPD (chronic obstructive pulmonary disease) ([name])   Acute kidney injury superimposed on CKD ([name])   OSA (obstructive sleep apnea)   Anxiety   Hypotension   Chest pain   Morbid obesity with BMI of 50.0-59.9, adult ([name])   Generalized weakness   Chronic colitis   Fluid overload   Eyelid cyst, right   Dry skin   Tracheitis  Assessment and Plan: Difficult to place due to tracheostomy and weight: continue w/ supportive care. See CM's notes.    Acute on chronic diastolic ONG:[id number] admitted for volume overloaded. Appears euvolemic currently. Continue on aldactone, torsemide    Atypical chest pain: resolved    Acute on chronic hypoxic & hypercapnic respiratory failure: continue on trach collar. S/p abx course for pseudomonas tracheitis. Also evaluated for non-massive hemoptysis with CT soft tissue of neck done on 02/08/2023.  Tracheostomy tube is in place and there is no definite collection around the tube.  There is thinning of the wall between the esophagus and trachea with possible defect. Stable currently on 2L [place]   COPD: severe. Bronchodilators prn    AKI on CKDIIIa: Cr is labile. Avoid nephrotoxic meds    OSA: CPAP qhs   Right eyelid cyst: lanced by ophthalmology    Chronic colitis: continue w/ supportive care. No longer taking mesalamine    Generalized weakness: PT/OT continues to work with the pt. Pt has been more motivated and working with physical therapy and mobility specialist, however, resistant to certain important exercises such as getting up from seated position to standing.   Depression & anxiety: severity unknown. Continue on lexapro    Morbid  obesity: BMI 52.1. complicates overall care & prognosis     Constipation: resolved       DVT prophylaxis: eliquis  Code Status: full  Family Communication: Disposition Plan: unclear. Unsafe d/c plan    Level of care: Med-Surg  Status is: Inpatient Remains inpatient appropriate because: unsafe d/c plans. See CM notes     Consultants:  Pulmon   Procedures:   Antimicrobials:   Subjective: Pt wants to leave the hospital on 04/22/23   Objective: Vitals:   04/04/23 2017 04/05/23 0358 04/05/23 0500 04/05/23 0805  BP: 107/65 111/78  121/79  Pulse: 85 82  82  Resp: 19 20  20   Temp: 98.2 F (36.8 C)   98 F (36.7 C)  TempSrc:      SpO2: 95% 96%  95%  Weight:   (!) 193.8 kg   Height:        Intake/Output Summary (Last 24 hours) at 04/05/2023 0817 Last data filed at 04/05/2023 0405 Gross per 24 hour  Intake --  Output 1750 ml  Net -1750 ml   Filed Weights   03/29/23 0500 04/04/23 0506 04/05/23 0500  Weight: (!) 194.6 kg (!) 193.8 kg (!) 193.8 kg    Examination:  General exam: appears comfortable  Respiratory system: decreased breath sounds b/l  Cardiovascular system: S1 & S2+ Gastrointestinal system: abd is soft, NT, obese & normal bowel sounds  Central nervous system: alert & oriented.  Psychiatry: Judgement and insight appears poor. Flat mood and affect    Data Reviewed: I have personally  reviewed following labs and imaging studies  CBC: Recent Labs  Lab 04/01/23 1225  WBC 6.9  HGB 10.3*  HCT 37.4*  MCV 76.5*  PLT 241   Basic Metabolic Panel: Recent Labs  Lab 04/01/23 1225 04/01/23 1227  NA  --  138  K  --  3.7  CL  --  99  CO2  --  29  GLUCOSE  --  102*  BUN  --  30*  CREATININE  --  1.53*  CALCIUM  --  8.0*  MG 2.6*  --    GFR: Estimated Creatinine Clearance: 88.2 mL/min (A) (by C-G formula based on SCr of 1.53 mg/dL (H)). Liver Function Tests: Recent Labs  Lab 04/01/23 1227  AST 12*  ALT 13  ALKPHOS 86  BILITOT 0.5  PROT  7.4  ALBUMIN 2.7*   No results for input(s): "LIPASE", "AMYLASE" in the last 168 hours. No results for input(s): "AMMONIA" in the last 168 hours. Coagulation Profile: No results for input(s): "INR", "PROTIME" in the last 168 hours. Cardiac Enzymes: No results for input(s): "CKTOTAL", "CKMB", "CKMBINDEX", "TROPONINI" in the last 168 hours. BNP (last 3 results) No results for input(s): "PROBNP" in the last 8760 hours. HbA1C: No results for input(s): "HGBA1C" in the last 72 hours. CBG: No results for input(s): "GLUCAP" in the last 168 hours. Lipid Profile: No results for input(s): "CHOL", "HDL", "LDLCALC", "TRIG", "CHOLHDL", "LDLDIRECT" in the last 72 hours. Thyroid Function Tests: No results for input(s): "TSH", "T4TOTAL", "FREET4", "T3FREE", "THYROIDAB" in the last 72 hours. Anemia Panel: No results for input(s): "VITAMINB12", "FOLATE", "FERRITIN", "TIBC", "IRON", "RETICCTPCT" in the last 72 hours. Sepsis Labs: No results for input(s): "PROCALCITON", "LATICACIDVEN" in the last 168 hours.  No results found for this or any previous visit (from the past 240 hours).       Radiology Studies: No results found.      Scheduled Meds:  amphetamine-dextroamphetamine  30 mg Oral Q breakfast   apixaban  2.5 mg Oral BID   bacitracin   Topical Daily   bisacodyl  10 mg Oral Daily   clotrimazole   Topical BID   docusate sodium  100 mg Oral BID   escitalopram  10 mg Oral Daily   ferrous sulfate  325 mg Oral Daily   Gerhardt's butt cream   Topical Daily   guaiFENesin  1,200 mg Oral BID   Ipratropium-Albuterol  1 puff Inhalation Q6H   polyethylene glycol  17 g Oral BID   spironolactone  25 mg Oral Daily   torsemide  40 mg Oral BID   Continuous Infusions:   LOS: 126 days       Charise Killian, MD [facility] Pager [phone]  If 7PM-7AM, please contact night-coverage [email or web address] 04/05/2023, 8:17 AM

## 2023-04-05 NOTE — Progress Notes (Signed)
PT Cancellation Note  Patient Details Name: WAINE MAJCHRZAK MRN: [id number] DOB: 11/25/1955   Cancelled Treatment:     PT attempt 3 x this date. AM session attempt, pt requested author return after lunch at 1pm. 2 attempts this afternoon however pt on the phone and unwilling to participate. PT will continue to follow and progress per current POC.    [name] 04/05/2023, 3:00 PM
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
## 2023-04-06 DIAGNOSIS — I5033 Acute on chronic diastolic (congestive) heart failure: Secondary | ICD-10-CM | POA: Diagnosis not present

## 2023-04-06 NOTE — Plan of Care (Signed)

## 2023-04-06 NOTE — Progress Notes (Signed)
Mobility Specialist - Progress Note  Post-mobility: HR 99, SPO2 93%   04/06/23 0950  Mobility  Activity Ambulated with assistance in hallway  Level of Assistance Standby assist, set-up cues, supervision of patient - no hands on  Assistive Device Front wheel walker  Distance Ambulated (ft) 45 ft  Activity Response Tolerated well  Mobility visit 1 Mobility  Mobility Specialist Start Time (ACUTE ONLY) [id number]  Mobility Specialist Stop Time (ACUTE ONLY) 0945  Mobility Specialist Time Calculation (min) (ACUTE ONLY) 21 min   Pt supine upon entry, utilizing RA. Pt motivated and agreeable to OOB amb in the hallway this date. Pt completed bed mob ModI, STS to RW and amb 25 ft in the hallway with supervision, no chair follow-- cuing for pursed lip breathing during amb. Pt returned to the room, left in fowler position with needs within reach.  Zetta Bills Mobility Specialist 04/06/23 9:54 AM

## 2023-04-06 NOTE — Progress Notes (Signed)
PROGRESS NOTE    Gary [name]  HQI:[id number] DOB: 1955/10/08 DOA: 11/28/2022 PCP: Shayne Alken, MD   Assessment & Plan:   Principal Problem:   Acute on chronic diastolic CHF (congestive heart failure) ([name]) Active Problems:   Acute on chronic respiratory failure with hypoxia and hypercapnia ([name])   COPD (chronic obstructive pulmonary disease) ([name])   Acute kidney injury superimposed on CKD ([name])   OSA (obstructive sleep apnea)   Anxiety   Hypotension   Chest pain   Morbid obesity with BMI of 50.0-59.9, adult ([name])   Generalized weakness   Chronic colitis   Fluid overload   Eyelid cyst, right   Dry skin   Tracheitis  Assessment and Plan: Difficult to place due to tracheostomy and weight: continue w/ supportive care. See CM's notes.    Acute on chronic diastolic XLK:[id number] admitted for volume overloaded. Appears euvolemic currently. Continue on torsemide, aldactone    Atypical chest pain: resolved    Acute on chronic hypoxic & hypercapnic respiratory failure: continue on trach collar. S/p abx course for pseudomonas tracheitis. Also evaluated for non-massive hemoptysis with CT soft tissue of neck done on 02/08/2023.  Tracheostomy tube is in place and there is no definite collection around the tube.  There is thinning of the wall between the esophagus and trachea with possible defect. Stable currently on 2L [place]   COPD: severe. Bronchodilators prn    AKI on CKDIIIa: Cr is labile. Avoid nephrotoxic meds  OSA: CPAP qhs   Right eyelid cyst: lanced by ophthalmology    Chronic colitis: continue w/ supportive care. No longer taking mesalamine    Generalized weakness: PT/OT continues to work with the pt. Pt has been more motivated and working with physical therapy and mobility specialist, however, resistant to certain important exercises such as getting up from seated position to standing.   Depression & anxiety: severity unknown. Continue on lexapro    Morbid  obesity: BMI 52.1. Complicates overall care & prognosis    Constipation: resolved       DVT prophylaxis: eliquis  Code Status: full  Family Communication: Disposition Plan: unclear. Unsafe d/c plan    Level of care: Med-Surg  Status is: Inpatient Remains inpatient appropriate because: unsafe d/c plans. See CM notes     Consultants:  Pulmon   Procedures:   Antimicrobials:   Subjective: Pt wants to leave the hospital on 04/22/23   Objective: Vitals:   04/05/23 2000 04/06/23 0440 04/06/23 0635 04/06/23 0747  BP: 116/67 115/82  113/81  Pulse: (!) 106 81  83  Resp: 18 18  18   Temp: 98.7 F (37.1 C) 98.3 F (36.8 C)  98.1 F (36.7 C)  TempSrc:      SpO2: 100% 99% 97% 93%  Weight:  (!) 191.2 kg    Height:        Intake/Output Summary (Last 24 hours) at 04/06/2023 0809 Last data filed at 04/06/2023 0100 Gross per 24 hour  Intake --  Output 1450 ml  Net -1450 ml   Filed Weights   04/04/23 0506 04/05/23 0500 04/06/23 0440  Weight: (!) 193.8 kg (!) 193.8 kg (!) 191.2 kg    Examination:  General exam: appears calm. Foul smelling odor  Respiratory system: diminished breath sounds b/l  Cardiovascular system: S1/S2+ Gastrointestinal system: abd is soft, NT, obese & hypoactive bowel sounds Central nervous system: alert and oriented. Moves all extremities  Psychiatry: Judgement and insight appears at baseline. Flat mood and affect  Data Reviewed: I have personally reviewed following labs and imaging studies  CBC: Recent Labs  Lab 04/01/23 1225  WBC 6.9  HGB 10.3*  HCT 37.4*  MCV 76.5*  PLT 241   Basic Metabolic Panel: Recent Labs  Lab 04/01/23 1225 04/01/23 1227  NA  --  138  K  --  3.7  CL  --  99  CO2  --  29  GLUCOSE  --  102*  BUN  --  30*  CREATININE  --  1.53*  CALCIUM  --  8.0*  MG 2.6*  --    GFR: Estimated Creatinine Clearance: 87.6 mL/min (A) (by C-G formula based on SCr of 1.53 mg/dL (H)). Liver Function Tests: Recent Labs   Lab 04/01/23 1227  AST 12*  ALT 13  ALKPHOS 86  BILITOT 0.5  PROT 7.4  ALBUMIN 2.7*   No results for input(s): "LIPASE", "AMYLASE" in the last 168 hours. No results for input(s): "AMMONIA" in the last 168 hours. Coagulation Profile: No results for input(s): "INR", "PROTIME" in the last 168 hours. Cardiac Enzymes: No results for input(s): "CKTOTAL", "CKMB", "CKMBINDEX", "TROPONINI" in the last 168 hours. BNP (last 3 results) No results for input(s): "PROBNP" in the last 8760 hours. HbA1C: No results for input(s): "HGBA1C" in the last 72 hours. CBG: No results for input(s): "GLUCAP" in the last 168 hours. Lipid Profile: No results for input(s): "CHOL", "HDL", "LDLCALC", "TRIG", "CHOLHDL", "LDLDIRECT" in the last 72 hours. Thyroid Function Tests: No results for input(s): "TSH", "T4TOTAL", "FREET4", "T3FREE", "THYROIDAB" in the last 72 hours. Anemia Panel: No results for input(s): "VITAMINB12", "FOLATE", "FERRITIN", "TIBC", "IRON", "RETICCTPCT" in the last 72 hours. Sepsis Labs: No results for input(s): "PROCALCITON", "LATICACIDVEN" in the last 168 hours.  No results found for this or any previous visit (from the past 240 hours).       Radiology Studies: No results found.      Scheduled Meds:  amphetamine-dextroamphetamine  30 mg Oral Q breakfast   apixaban  2.5 mg Oral BID   bacitracin   Topical Daily   bisacodyl  10 mg Oral Daily   clotrimazole   Topical BID   docusate sodium  100 mg Oral BID   escitalopram  10 mg Oral Daily   ferrous sulfate  325 mg Oral Daily   Gerhardt's butt cream   Topical Daily   guaiFENesin  1,200 mg Oral BID   Ipratropium-Albuterol  1 puff Inhalation Q6H   polyethylene glycol  17 g Oral BID   spironolactone  25 mg Oral Daily   torsemide  40 mg Oral BID   Continuous Infusions:   LOS: 127 days       Charise Killian, MD [facility] Pager [phone]  If 7PM-7AM, please contact  night-coverage [email or web address] 04/06/2023, 8:09 AM

## 2023-04-06 NOTE — Plan of Care (Signed)
  Problem: Education: Goal: Knowledge of General Education information will improve Description: Including pain rating scale, medication(s)/side effects and non-pharmacologic comfort measures Outcome: Progressing   Problem: Clinical Measurements: Goal: Respiratory complications will improve Outcome: Progressing Goal: Cardiovascular complication will be avoided Outcome: Progressing   Problem: Activity: Goal: Risk for activity intolerance will decrease Outcome: Progressing   Problem: Coping: Goal: Level of anxiety will decrease Outcome: Progressing   Problem: Elimination: Goal: Will not experience complications related to urinary retention Outcome: Progressing   Problem: Pain Managment: Goal: General experience of comfort will improve Outcome: Progressing   Problem: Safety: Goal: Ability to remain free from injury will improve Outcome: Progressing   Problem: Skin Integrity: Goal: Risk for impaired skin integrity will decrease Outcome: Progressing
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
## 2023-04-07 DIAGNOSIS — I5033 Acute on chronic diastolic (congestive) heart failure: Secondary | ICD-10-CM | POA: Diagnosis not present
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
## 2023-04-07 NOTE — Progress Notes (Signed)
PROGRESS NOTE   HPI was taken from Dr. Huel Cote: Gary [name] is a 67 y.o. 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   Gary [name] states that he ran out of his home torsemide several days ago due to a mixup with his pharmacy.  Then over the last couple days, he has noticed increasing lower extremity swelling, shortness of breath and chest pain.  He states that he usually develops chest pain when he has "fluid on his lungs."  He notes that his girlfriend feels that his face has even become more edematous.  During this time, he noticed some edema in his bilateral hands that was much worse on the left hand with associated numbness and tingling but this has improved.  He denies any nausea, vomiting, abdominal pain.   Gary [name] states that at 1 point, his supplemental oxygen level was as low as 6, but more recently he wears between 6-8L depending on how he feels.   ED course: On arrival to the ED, patient was hypertensive at 131/93 with heart rate of 103.  He was saturating at 91% on 8 L via trach collar.  He was afebrile at 98.8.  Initial workup notable for pH of 7.3 with pCO2 of 73, hemoglobin of 10.5, potassium 3.4, bicarb 34, glucose 119, BUN 28, creatinine 1.41 with GFR 55.  Troponin negative x 2.  BNP within normal limits.Chest x-ray notable for vascular congestion and cardiomegaly.  CT of the head and C-spine were obtained with no acute abnormalities.  Patient started on Lasix and TRH contacted for admission.    As per Dr. Mayford Knife 12/18-12/22/24: Gary [name] has remained medically stable. Gary [name] wants to be d/c [name]. Home health has been set up for Gary [name] as per CM. See CM's notes.     Gary [name]  YHC:[id number] DOB: 05-Aug-1955 DOA: 11/28/2022 PCP: Shayne Alken, MD   Assessment & Plan:    Principal Problem:   Acute on chronic diastolic CHF (congestive heart failure) ([name]) Active Problems:   Acute on chronic respiratory failure with hypoxia and hypercapnia ([name])   COPD (chronic obstructive pulmonary disease) ([name])   Acute kidney injury superimposed on CKD ([name])   OSA (obstructive sleep apnea)   Anxiety   Hypotension   Chest pain   Morbid obesity with BMI of 50.0-59.9, adult ([name])   Generalized weakness   Chronic colitis   Fluid overload   Eyelid cyst, right   Dry skin   Tracheitis  Assessment and Plan: Difficult to place due to tracheostomy and weight: continue w/ supportive care. See CM's notes.    Acute on chronic diastolic DVV:[id number] admitted for volume overloaded. Appears euvolemic currently. Continue on aldactone, torsemide    Atypical chest pain: resolved    Acute on chronic hypoxic & hypercapnic respiratory failure: continue on trach collar. S/p abx course for pseudomonas tracheitis. Also evaluated for non-massive hemoptysis with CT soft tissue of neck done on 02/08/2023.  Tracheostomy tube is in place and there is no definite collection around the tube.  There is thinning of the wall between the esophagus and trachea with possible defect. Stable currently on 2L [place]   COPD: severe. Bronchodilators prn    AKI on CKDIIIa: Cr is labile. Avoid nephrotoxic meds   OSA: CPAP qhs   Right eyelid cyst: lanced  by ophthalmology    Chronic colitis: continue w/ supportive care. No longer taking mesalamine    Generalized weakness: Gary [name]/OT continues to work with the Gary [name]. Gary [name] has been more motivated and working with physical therapy and mobility specialist, however, resistant to certain important exercises such as getting up from seated position to standing.   Depression & anxiety: severity unknown. Continue on lexapro   Morbid obesity: BMI 52.1. Complicates overall care & prognosis   Constipation: resolved       DVT prophylaxis: eliquis  Code Status: full   Family Communication: Disposition Plan: unclear. Unsafe d/c plan    Level of care: Med-Surg  Status is: Inpatient Remains inpatient appropriate because: unsafe d/c plans. See CM notes     Consultants:  Pulmon   Procedures:   Antimicrobials:   Subjective: Gary [name]   Objective: Vitals:   04/06/23 1958 04/07/23 0403 04/07/23 0607 04/07/23 0759  BP: 117/70 115/80  114/66  Pulse: 88 83  81  Resp: 19 18  18   Temp: 98 F (36.7 C) 98.3 F (36.8 C)  98.2 F (36.8 C)  TempSrc:      SpO2: 95% 97%  93%  Weight:   (!) 191.5 kg   Height:        Intake/Output Summary (Last 24 hours) at 04/07/2023 0825 Last data filed at 04/07/2023 0610 Gross per 24 hour  Intake 240 ml  Output 2400 ml  Net -2160 ml   Filed Weights   04/05/23 0500 04/06/23 0440 04/07/23 0607  Weight: (!) 193.8 kg (!) 191.2 kg (!) 191.5 kg    Examination:  General exam: appears calm & comfortable. Fould smelling odor  Respiratory system: decreased breath sounds b/l Cardiovascular system: S1 & S2+ Gastrointestinal system: abd is soft, NT, obese & hypoactive bowel sounds  Central nervous system: alert & oriented.   Psychiatry: Judgement and insight appears at baseline. Flat mood and affect    Data Reviewed: I have personally reviewed following labs and imaging studies  CBC: Recent Labs  Lab 04/01/23 1225  WBC 6.9  HGB 10.3*  HCT 37.4*  MCV 76.5*  PLT 241   Basic Metabolic Panel: Recent Labs  Lab 04/01/23 1225 04/01/23 1227  NA  --  138  K  --  3.7  CL  --  99  CO2  --  29  GLUCOSE  --  102*  BUN  --  30*  CREATININE  --  1.53*  CALCIUM  --  8.0*  MG 2.6*  --    GFR: Estimated Creatinine Clearance: 87.6 mL/min (A) (by C-G formula based on SCr of 1.53 mg/dL (H)). Liver Function Tests: Recent Labs  Lab 04/01/23 1227  AST 12*  ALT 13  ALKPHOS 86  BILITOT 0.5  PROT 7.4  ALBUMIN 2.7*   No results for input(s): "LIPASE", "AMYLASE" in the last  168 hours. No results for input(s): "AMMONIA" in the last 168 hours. Coagulation Profile: No results for input(s): "INR", "PROTIME" in the last 168 hours. Cardiac Enzymes: No results for input(s): "CKTOTAL", "CKMB", "CKMBINDEX", "TROPONINI" in the last 168 hours. BNP (last 3 results) No results for input(s): "PROBNP" in the last 8760 hours. HbA1C: No results for input(s): "HGBA1C" in the last 72 hours. CBG: No results for input(s): "GLUCAP" in the last 168 hours. Lipid Profile: No results for input(s): "CHOL", "HDL", "LDLCALC", "TRIG", "CHOLHDL", "LDLDIRECT" in the last 72 hours. Thyroid Function Tests: No results for input(s): "TSH", "T4TOTAL", "FREET4", "T3FREE", "  THYROIDAB" in the last 72 hours. Anemia Panel: No results for input(s): "VITAMINB12", "FOLATE", "FERRITIN", "TIBC", "IRON", "RETICCTPCT" in the last 72 hours. Sepsis Labs: No results for input(s): "PROCALCITON", "LATICACIDVEN" in the last 168 hours.  No results found for this or any previous visit (from the past 240 hours).       Radiology Studies: No results found.      Scheduled Meds:  amphetamine-dextroamphetamine  30 mg Oral Q breakfast   apixaban  2.5 mg Oral BID   bacitracin   Topical Daily   bisacodyl  10 mg Oral Daily   clotrimazole   Topical BID   docusate sodium  100 mg Oral BID   escitalopram  10 mg Oral Daily   ferrous sulfate  325 mg Oral Daily   Gerhardt's butt cream   Topical Daily   guaiFENesin  1,200 mg Oral BID   Ipratropium-Albuterol  1 puff Inhalation Q6H   polyethylene glycol  17 g Oral BID   spironolactone  25 mg Oral Daily   torsemide  40 mg Oral BID   Continuous Infusions:   LOS: 128 days       Charise Killian, MD [facility] Pager [phone]  If 7PM-7AM, please contact night-coverage [email or web address] 04/07/2023, 8:25 AM

## 2023-04-08 DIAGNOSIS — J9621 Acute and chronic respiratory failure with hypoxia: Secondary | ICD-10-CM | POA: Diagnosis not present

## 2023-04-08 DIAGNOSIS — I5033 Acute on chronic diastolic (congestive) heart failure: Secondary | ICD-10-CM | POA: Diagnosis not present

## 2023-04-08 DIAGNOSIS — J439 Emphysema, unspecified: Secondary | ICD-10-CM | POA: Diagnosis not present

## 2023-04-08 DIAGNOSIS — K921 Melena: Secondary | ICD-10-CM | POA: Diagnosis not present

## 2023-04-08 DIAGNOSIS — R0789 Other chest pain: Secondary | ICD-10-CM

## 2023-04-08 LAB — HEMOGLOBIN: Hemoglobin: 10.1 g/dL — ABNORMAL LOW (ref 13.0–17.0)

## 2023-04-08 MED ORDER — PANTOPRAZOLE SODIUM 40 MG IV SOLR
40.0000 mg | Freq: Two times a day (BID) | INTRAVENOUS | Status: DC
  Filled 2023-04-08: qty 10

## 2023-04-08 MED ORDER — PANTOPRAZOLE SODIUM 40 MG PO TBEC
40.0000 mg | DELAYED_RELEASE_TABLET | Freq: Two times a day (BID) | ORAL | Status: DC
  Administered 2023-04-08 – 2023-04-23 (×30): 40 mg via ORAL
  Filled 2023-04-08 (×30): qty 1

## 2023-04-08 NOTE — Plan of Care (Signed)

## 2023-04-08 NOTE — Progress Notes (Signed)
Lab tech was in and drew a HGB and type and cross as per provider order. Pt refused adamantly to wear the blood band and also refuses his ID bracelet. He was agitated, yelling and angry. Lab tech phoned the blood bank and tech and nurse both attempted to educate pt multiple times on the rules, regulations, and rational. He states because his arms and legs are swollen he doesn't want it on. He told the lab tech to "get out of here." When told that if his HGB is low, she would have to come back and draw the type and cross he said "I don't give a damn." Blood bank told him that if he refuses the band he refuses the blood transfusion and he stated "no I'm not!"

## 2023-04-08 NOTE — Progress Notes (Signed)
Progress Note   Patient: Gary [name] ZOX:[id number] DOB: 24-Jul-1955 DOA: 11/28/2022     129 DOS: the patient was seen and examined on 04/08/2023   Brief hospital course: 67 y.o. 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  Patient initially ran out of torsemide and was found to be in CHF exacerbation.  Eventually became euvolemic and transition to p.o. medications.  Patient has been on trach collar status post course of antibiotics for Pseudomonas tracheitis.  Overall stable on 2 L nasal cannula.  Currently awaiting safe disposition. Difficult to place due to tracheostomy   Notified by nursing staff that patient had multiple black bowel movements over the last few days.  Will hold Eliquis.  Check a hemoglobin now and CBC tomorrow morning.  Hold oral iron.   Assessment and Plan: * Melena Check hemoglobin now and tomorrow morning.  Start IV Protonix.  Hold oral iron just in case this is causing the black stools.  Hold Eliquis for right now.  Acute on chronic diastolic CHF (congestive heart failure) ([name]) Continue torsemide 40 mg twice daily.  Continue spironolactone.  Creatinine stable at 1.53.  No beta-blocker with respiratory issues.  Acute on chronic respiratory failure with hypoxia and hypercapnia ([name]) Currently on on 2 L.  Continue ventilator at night.  Patient went through prolonged weaning secondary to severe COPD with hypercapnic hypoxic respiratory failure and sleep apnea.  Earlier in the hospital stay was on much more oxygen.  COPD (chronic obstructive pulmonary disease) ([name]) Severe COPD on Combivent, as needed albuterol.   Acute kidney injury superimposed on CKD ([name]) Acute kidney injury on CKD stage IIIa.  Creatinine 1.53 with diuresis.  (On 9/18 did have a creatinine of  1.17).  Creatinine will likely stay higher being on torsemide 40 mg twice a day.  Anxiety On Atarax as needed.  Continue Lexapro  OSA (obstructive sleep apnea) Continue home ventilator at night  Chest pain ACS ruled out.  Hypotension Off midodrine  Dry skin Lac-Hydrin lotion to feet.  Added antifungal cream to feet.  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  Chronic colitis Continue home mesalamine  Generalized weakness Continue working with physical therapy and mobility specialist to work on getting out of the bed and ambulating.  Continue working on standing from commode and chair.  Continue also doing exercises in the bed.  Spoke with transitional care team and continue to enforce building up strength for discharge on 04/22/23.  Morbid obesity with BMI of 50.0-59.9, adult ([name]) BMI 51.04 with last height and weight in computer.         Subjective: Patient feeling okay.  States he has been walking with the mobility specialist.  He states he is doing better walking around the nursing station and trying to get up from the bed and chair.  Physical Exam: Vitals:   04/08/23 0428 04/08/23 0500 04/08/23 0755 04/08/23 0834  BP: 123/76  102/78   Pulse: 86  86   Resp:   17   Temp: (!) 97.5 F (36.4 C)  97.9 F (36.6 C)   TempSrc:      SpO2: 96%  92% 95%  Weight:  (!) 190.2 kg    Height:       Physical Exam HENT:     Head: Normocephalic.  Eyes:  General: Lids are normal.     Conjunctiva/sclera: Conjunctivae normal.  Cardiovascular:     Rate and Rhythm: Normal rate and regular rhythm.     Heart sounds: Normal heart sounds, S1 normal and S2 normal.  Pulmonary:     Breath sounds: Examination of the right-lower field reveals decreased breath sounds. Examination of the left-lower field reveals decreased breath sounds. Decreased breath sounds present. No wheezing or rhonchi.  Abdominal:     Palpations: Abdomen is soft.     Tenderness: There is no abdominal  tenderness.  Musculoskeletal:     Right lower leg: Swelling present.     Left lower leg: Swelling present.  Skin:    General: Skin is warm.     Findings: No rash.  Neurological:     Mental Status: He is alert and oriented to person, place, and time.     Data Reviewed: Will get a hemoglobin now  Disposition: Status is: Inpatient Remains inpatient appropriate because: Patient trying to get stronger and discharged by 04/22/2023.  Planned Discharge Destination: Home with Home Health    Time spent: 27 minutes  Author: Alford Highland, MD 04/08/2023 2:41 PM  For on call review [email or web address].

## 2023-04-08 NOTE — Progress Notes (Signed)
Notified on call provider as follows: (lab phoned to draw blood at 2325 and pt notified they would be coming and why)  Just to let you know that pt continues to have Frank Red blood with nickel size occasional clots 400-500cc at a time. VSS - any interventions at this time?  13 mins MJ Modou L Jawo, NP How many occurrences since your shift commenced?  11 mins x4  10 mins MJ Modou L Jawo, NP So a total output of 1600cc-2000cc of frank red blood?  9 mins approximately yes  It keeps getting more red - it is completely all blood now  8 mins Modou L Jawo, NP Copy.  MJ I will order an H&H and a type and screen. And we will go from there.  8 mins Thank you!  7 mins MJ Modou Charlotte Sanes, NP You're welcome. Kindly call lab to have the blood drawn; order in.
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
## 2023-04-08 NOTE — Progress Notes (Signed)
On call provider notified via secure chat as follows:   Pt had 3 XXL loose BM's this morning before I left at 0700 that was black. He has since had several throughout the day and is now incontinent of BM that has Frank blood in it and is watery. [facility] RN stated that they are doing another HGB in the morning. His HGB today was 10.1 and is basically unchanged. Albumin 2.7, AST 12. Current vitals 97.7-77-20 75/46. 86-91 on 2L laying on his side with head down.  19:45 MJ Modou L Jawo, NP Have the pt lay on his back and get a manual pressure. Thanks.  19:51 We are trying but having a hard time hearing it.   20:10 112/77  20:16 MJ Modou Charlotte Sanes, NP Copy. Thank you for getting the reading.  20:16 MJ Just to let you know that pt continues to have Frank Red blood with nickel size occasional clots 400-500cc at a time. VSS - any interventions at this time?

## 2023-04-08 NOTE — Care Management Important Message (Signed)
Important Message  Patient Details  Name: Gary [name] MRN: [id number] Date of Birth: [birth year]   Important Message Given:  Yes - Medicare IM     Bernadette Hoit 04/08/2023, 11:05 AM

## 2023-04-08 NOTE — Assessment & Plan Note (Deleted)
Check hemoglobin now and tomorrow morning.  Start IV Protonix.  Hold oral iron just in case this is causing the black stools.  Hold Eliquis for right now.

## 2023-04-08 NOTE — Progress Notes (Signed)
Mobility Specialist - Progress Note  Post-mobility: HR 109, SPO2 90%   04/08/23 1123  Mobility  Activity Ambulated with assistance in hallway  Level of Assistance Standby assist, set-up cues, supervision of patient - no hands on  Assistive Device Front wheel walker  Distance Ambulated (ft) 80 ft  Mobility visit 1 Mobility  Mobility Specialist Start Time (ACUTE ONLY) 1018  Mobility Specialist Stop Time (ACUTE ONLY) 1059  Mobility Specialist Time Calculation (min) (ACUTE ONLY) 41 min   Pt supine upon entry, utilizing 2L. Pt motivated and agreeable to OOB in the hallway this date. Pt completed bed mob ModI, STS to RW and amb ~80 ft in the hallway on 2L before sitting in the recliner d/t BUE weakness and fatigue. During amb Pt stopped at ~60 ft for a standing rest break, cues for pursed lip breathing during amb. Pt wheeled into the room for STS from recliner to bed. Upon standing attempt Pt expresses "I can't do this, I can feel it coming out" referring to BM, returns seated. Pt transfer to bed via [facility] lift +2 for safety, left supine with alarm set and needs within reach.  Zetta Bills Mobility Specialist 04/08/23 11:28 AM

## 2023-04-08 NOTE — Progress Notes (Signed)
Pt refused blood draw x2 this shift. He stated to "come back at 8am" Pt educated.

## 2023-04-09 DIAGNOSIS — K921 Melena: Secondary | ICD-10-CM | POA: Diagnosis not present

## 2023-04-09 DIAGNOSIS — I5033 Acute on chronic diastolic (congestive) heart failure: Secondary | ICD-10-CM | POA: Diagnosis not present

## 2023-04-09 DIAGNOSIS — D62 Acute posthemorrhagic anemia: Secondary | ICD-10-CM

## 2023-04-09 DIAGNOSIS — J9621 Acute and chronic respiratory failure with hypoxia: Secondary | ICD-10-CM | POA: Diagnosis not present

## 2023-04-09 LAB — CBC
HCT: 34 % — ABNORMAL LOW (ref 39.0–52.0)
HCT: 34.9 % — ABNORMAL LOW (ref 39.0–52.0)
Hemoglobin: 9.3 g/dL — ABNORMAL LOW (ref 13.0–17.0)
Hemoglobin: 9.5 g/dL — ABNORMAL LOW (ref 13.0–17.0)
MCH: 21 pg — ABNORMAL LOW (ref 26.0–34.0)
MCH: 21.3 pg — ABNORMAL LOW (ref 26.0–34.0)
MCHC: 27.2 g/dL — ABNORMAL LOW (ref 30.0–36.0)
MCHC: 27.4 g/dL — ABNORMAL LOW (ref 30.0–36.0)
MCV: 76.7 fL — ABNORMAL LOW (ref 80.0–100.0)
MCV: 78.3 fL — ABNORMAL LOW (ref 80.0–100.0)
Platelets: 225 10*3/uL (ref 150–400)
Platelets: 230 10*3/uL (ref 150–400)
RBC: 4.43 MIL/uL (ref 4.22–5.81)
RBC: 4.46 MIL/uL (ref 4.22–5.81)
RDW: 18.9 % — ABNORMAL HIGH (ref 11.5–15.5)
RDW: 19.2 % — ABNORMAL HIGH (ref 11.5–15.5)
WBC: 8.2 10*3/uL (ref 4.0–10.5)
WBC: 9.3 10*3/uL (ref 4.0–10.5)
nRBC: 0 % (ref 0.0–0.2)
nRBC: 0.2 % (ref 0.0–0.2)

## 2023-04-09 LAB — TYPE AND SCREEN
ABO/RH(D): A NEG
Antibody Screen: NEGATIVE

## 2023-04-09 LAB — BASIC METABOLIC PANEL
Anion gap: 8 (ref 5–15)
BUN: 36 mg/dL — ABNORMAL HIGH (ref 8–23)
CO2: 29 mmol/L (ref 22–32)
Calcium: 8.2 mg/dL — ABNORMAL LOW (ref 8.9–10.3)
Chloride: 99 mmol/L (ref 98–111)
Creatinine, Ser: 1.6 mg/dL — ABNORMAL HIGH (ref 0.61–1.24)
GFR, Estimated: 48 mL/min — ABNORMAL LOW (ref >60)
Glucose, Bld: 100 mg/dL — ABNORMAL HIGH (ref 70–99)
Potassium: 3.6 mmol/L (ref 3.5–5.1)
Sodium: 136 mmol/L (ref 135–145)

## 2023-04-09 LAB — HEMOGLOBIN: Hemoglobin: 8.9 g/dL — ABNORMAL LOW (ref 13.0–17.0)

## 2023-04-09 NOTE — Progress Notes (Signed)
       CROSS COVER NOTE  NAME: Gary [name] MRN: [id number] DOB : 03/26/1956    Date of Service   04/09/2023   HPI/Events of Note   Care nurse reported that patient had multiple episodes of bloody bowel movement.  She estimated that the total output was as about 1600 cc.  Review showed that patient has been having melena prior to these occurrences.  Patient is alert and oriented and denies acute symptoms.  Interventions   -H&H repeated overnight and resulted at 9.3 g/dL from 93.2 g/dL. -Morning H&H pending.  Kindly follow. -Type and screen sent for analysis in case patient needs to be transfused.  Patient consented to getting transfusion.       [name] Geradine Girt, MSN, APRN, AGACNP-BC [facility] [place] Pager: [phone]. Check Amion for Availability

## 2023-04-09 NOTE — Plan of Care (Signed)

## 2023-04-09 NOTE — Progress Notes (Signed)
Occupational Therapy Treatment Patient Details Name: Gary [name] MRN: [id number] DOB: [birth year] Today's Date: 04/09/2023   History of present illness 67 y/o male presented to [facility] ED on 11/28/22 for chest pain x 4 days. Admitted for acute on chronic CHF. Frequent admissions this year with most recent dsicharge on 11/21/22. PMH includes obesity, hypoxia, respiratory failure with tracheostomy, COPD, GIB, HFpEF.   OT comments  Gary [name] was seen for OT treatment on this date. Upon arrival to room pt in bed, agreeable to tx. Pt requires no assist sup<>sit or standing from elevated bed height. Tolerates ~180ft mobility using RW. Stands from recliner with MIN-MOD A x2 to return to bed. Pt making good progress toward goals, will continue to follow POC. Discharge recommendation remains appropriate.        If plan is discharge home, recommend the following:  A little help with walking and/or transfers;A lot of help with bathing/dressing/bathroom   Equipment Recommendations  BSC/3in1    Recommendations for Other Services      Precautions / Restrictions Precautions Precautions: Fall Precaution Comments: capped trach Restrictions Weight Bearing Restrictions Per Provider Order: No       Mobility Bed Mobility Overal bed mobility: Modified Independent                  Transfers Overall transfer level: Needs assistance Equipment used: Rolling walker (2 wheels) Transfers: Sit to/from Stand Sit to Stand: Min assist, Mod assist, +2 physical assistance           General transfer comment: standing from low chair     Balance Overall balance assessment: Needs assistance Sitting-balance support: No upper extremity supported Sitting balance-[name] Scale: Normal     Standing balance support: Bilateral upper extremity supported, During functional activity, Reliant on assistive device for balance Standing balance-[name] Scale: Fair                             ADL  either performed or assessed with clinical judgement   ADL Overall ADL's : Needs assistance/impaired                                       General ADL Comments: MAX A don B shoes in sitting      Cognition Arousal: Alert Behavior During Therapy: WFL for tasks assessed/performed Overall Cognitive Status: Within Functional Limits for tasks assessed                                 General Comments: eager for mobility today                   Pertinent Vitals/ Pain       Pain Assessment Pain Assessment: No/denies pain   Frequency  Min 1X/week        Progress Toward Goals  OT Goals(current goals can now be found in the care plan section)  Progress towards OT goals: Progressing toward goals  Acute Rehab OT Goals Patient Stated Goal: to get stronger OT Goal Formulation: With patient Time For Goal Achievement: 04/30/23 Potential to Achieve Goals: Good ADL Goals Pt Will Perform Grooming: with modified independence;standing Pt Will Transfer to Toilet: with modified independence;ambulating;regular height toilet Pt Will Perform Toileting - Clothing Manipulation and hygiene: with mod assist;sit to/from stand Additional ADL Goal #  1: Pt will stand from the hospital bed or recliner adn LR DME with min A as a precursor to ADLs  Plan Discharge plan remains appropriate;Frequency remains appropriate    Co-evaluation                 AM-PAC OT "6 Clicks" Daily Activity     Outcome Measure   Help from another person eating meals?: None Help from another person taking care of personal grooming?: A Little Help from another person toileting, which includes using toliet, bedpan, or urinal?: A Lot Help from another person bathing (including washing, rinsing, drying)?: A Lot Help from another person to put on and taking off regular upper body clothing?: None Help from another person to put on and taking off regular lower body clothing?: A Lot 6  Click Score: 17    End of Session Equipment Utilized During Treatment: Rolling walker (2 wheels);Oxygen  OT Visit Diagnosis: Unsteadiness on feet (R26.81);Other abnormalities of gait and mobility (R26.89);Muscle weakness (generalized) (M62.81);Adult, failure to thrive (R62.7)   Activity Tolerance Patient tolerated treatment well   Patient Left with call bell/phone within reach;in bed   Nurse Communication Mobility status        Time: 2130-8657 OT Time Calculation (min): 23 min  Charges: OT General Charges $OT Visit: 1 Visit OT Treatments $Therapeutic Activity: 23-37 mins  Kathie Dike, M.S. OTR/L  04/09/23, 1:56 PM  ascom [phone]

## 2023-04-09 NOTE — Assessment & Plan Note (Addendum)
Hemoglobin went from 10.1 down to 8.9.  Holding Eliquis.  On oral Protonix.  May end up needing a GI workup.  Last bowel movement was this morning.  Last bowel movement plus also bright red.

## 2023-04-09 NOTE — Assessment & Plan Note (Signed)
Holding Eliquis.  On oral Protonix.  No bleeding since yesterday morning.  Holding off on GI workup.

## 2023-04-09 NOTE — Progress Notes (Signed)
Progress Note   Patient: Gary [name] ZOX:[id number] DOB: 09-Apr-1956 DOA: 11/28/2022     130 DOS: the patient was seen and examined on 04/09/2023   Brief hospital course: 67 y.o. 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  Patient initially ran out of torsemide and was found to be in CHF exacerbation.  Eventually became euvolemic and transition to p.o. medications.  Patient has been on trach collar status post course of antibiotics for Pseudomonas tracheitis.  Overall stable on 2 L nasal cannula.  Currently awaiting safe disposition. Difficult to place due to tracheostomy   Notified by nursing staff that patient had multiple black bowel movements over the last few days.  Will hold Eliquis.  Check a hemoglobin now and CBC tomorrow morning.  Hold oral iron.  Assessment and Plan: * Acute blood loss anemia Hemoglobin went from 10.1 down to 8.9.  Holding Eliquis.  On oral Protonix.  May end up needing a GI workup.  Last bowel movement was this morning.  Last bowel movement plus also bright red.  Melena Holding Eliquis.  On oral Protonix.  May end up needing a GI workup if continues to have bleeding.  Acute on chronic diastolic CHF (congestive heart failure) ([name]) Continue torsemide 40 mg twice daily.  Continue spironolactone.  Creatinine stable at 1.60.  No beta-blocker with respiratory issues.  Acute on chronic respiratory failure with hypoxia and hypercapnia ([name]) Currently on on 2 L.  Continue ventilator at night.  Patient went through prolonged weaning secondary to severe COPD with hypercapnic hypoxic respiratory failure and sleep apnea.  Earlier in the hospital stay was on much more oxygen.  COPD (chronic obstructive pulmonary disease) ([name]) Severe COPD on Combivent, as needed albuterol.    Acute kidney injury superimposed on CKD ([name]) Acute kidney injury on CKD stage IIIa.  Creatinine 1.60 with diuresis.  (On 9/18 did have a creatinine of 1.17).  Creatinine will likely stay higher being on torsemide 40 mg twice a day.  Anxiety On Atarax as needed.  Continue Lexapro  OSA (obstructive sleep apnea) Continue home ventilator at night  Chest pain ACS ruled out.  Hypotension Off midodrine  Dry skin Lac-Hydrin lotion to feet.  Added antifungal cream to feet.  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  Chronic colitis Continue home mesalamine  Generalized weakness Continue working with physical therapy and mobility specialist to work on getting out of the bed and ambulating.  Continue working on standing from commode and chair.  Continue also doing exercises in the bed.  Spoke with transitional care team and continue to enforce building up strength for discharge on 04/22/23.  Morbid obesity with BMI of 50.0-59.9, adult ([name]) BMI 51.04 with last height and weight in computer.         Subjective: Patient is feeling okay.  Holding Eliquis with GI bleed.  On oral Protonix since we lost IV access.  Hemoglobin drifted down to 8.9.  Last bowel movement was 8 this morning.  Hopefully things will settle down with holding Eliquis.  Physical Exam: Vitals:   04/08/23 2015 04/08/23 2305 04/09/23 0833 04/09/23 0842  BP: 112/77 109/69  101/79  Pulse: 91 93 85 87  Resp:  20 20 18   Temp:  98.3 F (36.8 C)  97.8 F (36.6 C)  TempSrc:  Oral  SpO2: 96% 91% 91% 96%  Weight:      Height:       Physical Exam HENT:     Head: Normocephalic.  Eyes:     General: Lids are normal.     Conjunctiva/sclera: Conjunctivae normal.  Cardiovascular:     Rate and Rhythm: Normal rate and regular rhythm.     Heart sounds: Normal heart sounds, S1 normal and S2 normal.  Pulmonary:     Breath sounds: Examination of the right-lower field reveals decreased breath sounds. Examination of  the left-lower field reveals decreased breath sounds. Decreased breath sounds present. No wheezing or rhonchi.  Abdominal:     Palpations: Abdomen is soft.     Tenderness: There is no abdominal tenderness.  Musculoskeletal:     Right lower leg: Swelling present.     Left lower leg: Swelling present.  Skin:    General: Skin is warm.     Findings: No rash.  Neurological:     Mental Status: He is alert and oriented to person, place, and time.     Data Reviewed: Hemoglobin this morning 9.5 hemoglobin this afternoon 8.9.    Disposition: Status is: Inpatient Remains inpatient appropriate because: GI bleed.  Holding Eliquis  Planned Discharge Destination: Home with Home Health    Time spent: 28 minutes  Author: Alford Highland, MD 04/09/2023 3:53 PM  For on call review [email or web address].

## 2023-04-10 DIAGNOSIS — J9621 Acute and chronic respiratory failure with hypoxia: Secondary | ICD-10-CM | POA: Diagnosis not present

## 2023-04-10 DIAGNOSIS — I5033 Acute on chronic diastolic (congestive) heart failure: Secondary | ICD-10-CM | POA: Diagnosis not present

## 2023-04-10 DIAGNOSIS — K921 Melena: Secondary | ICD-10-CM | POA: Diagnosis not present

## 2023-04-10 DIAGNOSIS — D62 Acute posthemorrhagic anemia: Secondary | ICD-10-CM | POA: Diagnosis not present

## 2023-04-10 LAB — CBC
HCT: 31.4 % — ABNORMAL LOW (ref 39.0–52.0)
Hemoglobin: 8.6 g/dL — ABNORMAL LOW (ref 13.0–17.0)
MCH: 21 pg — ABNORMAL LOW (ref 26.0–34.0)
MCHC: 27.4 g/dL — ABNORMAL LOW (ref 30.0–36.0)
MCV: 76.8 fL — ABNORMAL LOW (ref 80.0–100.0)
Platelets: 230 10*3/uL (ref 150–400)
RBC: 4.09 MIL/uL — ABNORMAL LOW (ref 4.22–5.81)
RDW: 19 % — ABNORMAL HIGH (ref 11.5–15.5)
WBC: 7.9 10*3/uL (ref 4.0–10.5)
nRBC: 0.6 % — ABNORMAL HIGH (ref 0.0–0.2)

## 2023-04-10 LAB — BASIC METABOLIC PANEL
Anion gap: 7 (ref 5–15)
BUN: 31 mg/dL — ABNORMAL HIGH (ref 8–23)
CO2: 30 mmol/L (ref 22–32)
Calcium: 7.9 mg/dL — ABNORMAL LOW (ref 8.9–10.3)
Chloride: 99 mmol/L (ref 98–111)
Creatinine, Ser: 1.59 mg/dL — ABNORMAL HIGH (ref 0.61–1.24)
GFR, Estimated: 48 mL/min — ABNORMAL LOW (ref >60)
Glucose, Bld: 126 mg/dL — ABNORMAL HIGH (ref 70–99)
Potassium: 3.6 mmol/L (ref 3.5–5.1)
Sodium: 136 mmol/L (ref 135–145)

## 2023-04-10 MED ORDER — MESALAMINE 400 MG PO CPDR
800.0000 mg | DELAYED_RELEASE_CAPSULE | Freq: Two times a day (BID) | ORAL | Status: DC
  Administered 2023-04-10 – 2023-04-23 (×27): 800 mg via ORAL
  Filled 2023-04-10 (×28): qty 2

## 2023-04-10 NOTE — Progress Notes (Signed)
Progress Note   Patient: Gary [name] WGN:[id number] DOB: 09-29-1955 DOA: 11/28/2022     131 DOS: the patient was seen and examined on 04/10/2023   Brief hospital course: 67 y.o. 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  Patient initially ran out of torsemide and was found to be in CHF exacerbation.  Eventually became euvolemic and transition to p.o. medications.  Patient has been on trach collar status post course of antibiotics for Pseudomonas tracheitis.  Overall stable on 2 L nasal cannula.  Currently awaiting safe disposition. Difficult to place due to tracheostomy  12/23.  Eliquis held secondary to black stools for the last couple days.  Empirically placed on Protonix. 12/25.  Hemoglobin drifted down to 8.6.  No further bowel movement since yesterday.  Case discussed with gastroenterology since bleeding is stopped we will hold off on procedures.  If rebleeds can consider a bleeding scan.     Assessment and Plan: * Acute blood loss anemia Hemoglobin went from 10.1 down to 8.6.  Holding Eliquis.  On oral Protonix.  Since last bowel movement was yesterday and none since we will hold off on GI evaluation.  Case discussed with gastroenterologist.  Continue to monitor.  Melena Holding Eliquis.  On oral Protonix.  No bleeding since yesterday morning.  Holding off on GI workup.  Acute on chronic diastolic CHF (congestive heart failure) ([name]) Continue torsemide 40 mg twice daily.  Continue spironolactone.  Creatinine stable at 1.59.  No beta-blocker with respiratory issues.  Acute on chronic respiratory failure with hypoxia and hypercapnia ([name]) Currently on on 2 L.  Continue ventilator at night.  Patient went through prolonged weaning secondary to severe COPD with hypercapnic hypoxic  respiratory failure and sleep apnea.  Earlier in the hospital stay was on much more oxygen.  COPD (chronic obstructive pulmonary disease) ([name]) Severe COPD on Combivent, as needed albuterol.   Acute kidney injury superimposed on CKD ([name]) Acute kidney injury on CKD stage IIIa.  Creatinine 1.59 with diuresis.  (On 9/18 did have a creatinine of 1.17).  Creatinine will likely stay higher being on torsemide 40 mg twice a day.  Anxiety On Atarax as needed.  Continue Lexapro  OSA (obstructive sleep apnea) Continue home ventilator at night  Chest pain ACS ruled out.  Hypotension Off midodrine  Dry skin Lac-Hydrin and antifungal cream to feet.  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  Chronic colitis Continue home mesalamine  Generalized weakness Continue working with physical therapy and mobility specialist to work on getting out of the bed and ambulating.  Continue working on standing from commode and chair.  Continue also doing exercises in the bed.  Spoke with transitional care team and continue to enforce building up strength for discharge on 04/22/23.  Morbid obesity with BMI of 50.0-59.9, adult ([name]) BMI 51.04 with last height and weight in computer.         Subjective: Patient has not had a bowel movement since yesterday morning.  Feels okay.  Hemoglobin drifted down to 8.6.  Physical Exam: Vitals:   04/09/23 2338 04/10/23 0522 04/10/23 0743 04/10/23 1508  BP:  116/71 117/75 (!) 103/54  Pulse: 83 88 84 87  Resp:  18 18 18   Temp:  97.8 F (36.6 C) 97.6 F (36.4 C) 98.1 F (36.7 C)  TempSrc:      SpO2: 94% 95% 93% 95%  Weight:      Height:       Physical Exam HENT:     Head: Normocephalic.  Eyes:     General: Lids are normal.     Conjunctiva/sclera: Conjunctivae normal.  Cardiovascular:     Rate and Rhythm: Normal rate and regular rhythm.     Heart sounds: Normal heart sounds, S1 normal and S2 normal.  Pulmonary:     Breath sounds: Examination of the  right-lower field reveals decreased breath sounds. Examination of the left-lower field reveals decreased breath sounds. Decreased breath sounds present. No wheezing or rhonchi.  Abdominal:     Palpations: Abdomen is soft.     Tenderness: There is no abdominal tenderness.  Musculoskeletal:     Right lower leg: Swelling present.     Left lower leg: Swelling present.  Skin:    General: Skin is warm.     Findings: No rash.  Neurological:     Mental Status: He is alert and oriented to person, place, and time.     Data Reviewed: Creatinine 1.59, hemoglobin 8.6   Disposition: Status is: Inpatient Remains inpatient appropriate because: Needs to get stronger to go home with home health.  Last couple days had bleeding.  Planned Discharge Destination: Home    Time spent: 26 minutes Phone consultation with gastroenterology  Author: Alford Highland, MD 04/10/2023 3:09 PM  For on call review [email or web address].

## 2023-04-10 NOTE — Plan of Care (Signed)
Patient was stable throughout shift, no bowel movements. He refused clean up and butt cream amongst other things. No acute events otherwise.

## 2023-04-11 DIAGNOSIS — J9621 Acute and chronic respiratory failure with hypoxia: Secondary | ICD-10-CM | POA: Diagnosis not present

## 2023-04-11 DIAGNOSIS — D62 Acute posthemorrhagic anemia: Secondary | ICD-10-CM | POA: Diagnosis not present

## 2023-04-11 DIAGNOSIS — K921 Melena: Secondary | ICD-10-CM | POA: Diagnosis not present

## 2023-04-11 DIAGNOSIS — I5033 Acute on chronic diastolic (congestive) heart failure: Secondary | ICD-10-CM | POA: Diagnosis not present

## 2023-04-11 LAB — HEMOGLOBIN: Hemoglobin: 8.1 g/dL — ABNORMAL LOW (ref 13.0–17.0)

## 2023-04-11 LAB — BASIC METABOLIC PANEL
Anion gap: 7 (ref 5–15)
BUN: 27 mg/dL — ABNORMAL HIGH (ref 8–23)
CO2: 30 mmol/L (ref 22–32)
Calcium: 7.6 mg/dL — ABNORMAL LOW (ref 8.9–10.3)
Chloride: 100 mmol/L (ref 98–111)
Creatinine, Ser: 1.74 mg/dL — ABNORMAL HIGH (ref 0.61–1.24)
GFR, Estimated: 43 mL/min — ABNORMAL LOW (ref >60)
Glucose, Bld: 140 mg/dL — ABNORMAL HIGH (ref 70–99)
Potassium: 3.4 mmol/L — ABNORMAL LOW (ref 3.5–5.1)
Sodium: 137 mmol/L (ref 135–145)

## 2023-04-11 MED ORDER — IRON SUCROSE 300 MG IVPB - SIMPLE MED
300.0000 mg | Freq: Once | Status: AC
  Administered 2023-04-11: 300 mg via INTRAVENOUS
  Filled 2023-04-11: qty 300

## 2023-04-11 MED ORDER — TORSEMIDE 20 MG PO TABS
40.0000 mg | ORAL_TABLET | Freq: Two times a day (BID) | ORAL | Status: DC
  Administered 2023-04-12 – 2023-04-23 (×23): 40 mg via ORAL
  Filled 2023-04-11 (×23): qty 2

## 2023-04-11 NOTE — Plan of Care (Signed)
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
## 2023-04-11 NOTE — Progress Notes (Signed)
Mobility Specialist - Progress Note  Pre-mobility: SpO2 91%   04/11/23 1510  Mobility  Activity Ambulated with assistance in hallway  Level of Assistance Standby assist, set-up cues, supervision of patient - no hands on  Assistive Device Front wheel walker  Distance Ambulated (ft) 60 ft  Activity Response Tolerated well  Mobility visit 1 Mobility  Mobility Specialist Start Time (ACUTE ONLY) 1209  Mobility Specialist Stop Time (ACUTE ONLY) 1238  Mobility Specialist Time Calculation (min) (ACUTE ONLY) 29 min   Pt supine upon entry, utilizing RA. Pt reported feeling "sick" believing it may be d/t new medication, however agreeable to OOB amb. Pt completed bed mob ModI, expressed dizziness upon transfer EOB, dangled EOB ~5 mins until dizziness subsided. Pt STS to RW and amb 40 ft in the hallway with supervision, tolerated well-- noticeable able to note when fatigue and able to self select return distance to the room. Pt returned to the room, left in fowler position with alarm set and needs within reach.  Zetta Bills Mobility Specialist 04/11/23 3:14 PM

## 2023-04-11 NOTE — Progress Notes (Addendum)
PT Cancellation Note  Patient Details Name: Gary [name] MRN: [id number] DOB: 1956/03/04   Cancelled Treatment:    Reason Eval/Treat Not Completed: Other (comment) Arrived to pt's room ~13:20, reports he had been up earlier and just got back to bed.  States he needs time to recover, but may be willing to work with PT later today.  Will re-attempt as time allows and pt is willing to participate.    [name], DPT 04/11/2023, 1:36 PM  Update: attempted to see again (per pt request between 3-5:00pm) at 15:20, pt on his phone stating that he is supposed to get an IV soon and does not want to do anything right now.  Encouraged some activity, proposing repeated sit to stands at EOB or similar in preparation for planned d/c home in <2 weeks; but he said "I'm all good for PT today, I'll do it tomorrow."  Of note he did ambulate ~60 ft with mobility tech earlier today.  Will maintain on caseload and continue to see as able.

## 2023-04-11 NOTE — Progress Notes (Signed)
Progress Note   Patient: Gary [name] ZOX:[id number] DOB: [birth year] DOA: 11/28/2022     132 DOS: the patient was seen and examined on 04/11/2023   Brief hospital course: 67 y.o. 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  Patient initially ran out of torsemide and was found to be in CHF exacerbation.  Eventually became euvolemic and transition to p.o. medications.  Patient has been on trach collar status post course of antibiotics for Pseudomonas tracheitis.  Overall stable on 2 L nasal cannula.  Currently awaiting safe disposition. Difficult to place due to tracheostomy  12/23.  Eliquis held secondary to black stools for the last couple days.  Empirically placed on Protonix. 12/25.  Hemoglobin drifted down to 8.6.  No further bowel movement since yesterday.  Case discussed with gastroenterology since bleeding is stopped we will hold off on procedures.  If rebleeds can consider a bleeding scan. 12/26.  Hemoglobin down to 8.1.  Patient agreeable for an IV for IV iron.  Still no bowel movement since the 24th.   Assessment and Plan: * Acute blood loss anemia Hemoglobin went from 10.1 down to 8.1.  Holding Eliquis.  On oral Protonix.  Since last bowel movement was on 12/24, continue to monitor bowel movements case discussed with gastroenterologist on 12/25 and we decided to hold off on luminal evaluations at this time since he had a colonoscopy back in May and endoscopy in October.  Transfuse if needed.  If rebleeds can consider a bleeding scan.  Will give IV iron today.  Melena Holding Eliquis.  On oral Protonix.  No bleeding since 12/24.  Holding off on GI workup.  Acute on chronic respiratory failure with hypoxia and hypercapnia ([name]) Currently on on 2 L.  Continue ventilator at night.   Patient went through prolonged weaning secondary to severe COPD with hypercapnic hypoxic respiratory failure and sleep apnea.  Earlier in the hospital stay was on much more oxygen.  Acute on chronic diastolic CHF (congestive heart failure) ([name]) With creatinine creeping up to 1.74 will hold this evening's torsemide dose and reassess tomorrow.  Patient on torsemide 40 mg twice daily and spironolactone.  No beta-blocker with respiratory issues.  COPD (chronic obstructive pulmonary disease) ([name]) Severe COPD on Combivent, as needed albuterol.   Acute kidney injury superimposed on CKD ([name]) Acute kidney injury on CKD stage IIIa.  Creatinine 1.74 with diuresis.  (On 9/18 did have a creatinine of 1.17).  Creatinine will likely stay higher being on torsemide 40 mg twice a day.  Holding torsemide this evening with creatinine rise.  Check BMP tomorrow.  Anxiety On Atarax as needed.  Continue Lexapro  OSA (obstructive sleep apnea) Continue home ventilator at night  Chest pain ACS ruled out.  Hypotension Off midodrine  Dry skin Lac-Hydrin and antifungal cream to feet.  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  Chronic colitis Continue home mesalamine  Generalized weakness Continue working with physical therapy and mobility specialist to work on getting out of the bed and ambulating.  Continue working on standing from commode and chair.  Continue also doing exercises in the bed.  Continue to enforce building up strength for discharge on 04/22/23.  Morbid obesity with BMI of 50.0-59.9, adult ([name]) BMI 52.65 with last height and weight in computer.  Subjective: Patient feels okay.  Working with physical therapy and getting stronger.  No bowel movement last 2 days.  Hemoglobin drifted down to 8.1.  Initially admitted 134 days ago with shortness of breath.  Physical Exam: Vitals:   04/11/23 0738 04/11/23 1222 04/11/23 1436 04/11/23 1517  BP: 103/74 119/73  126/72  Pulse: 84   83 81  Resp: 17  18 20   Temp: 98.1 F (36.7 C)   98.3 F (36.8 C)  TempSrc: Oral   Oral  SpO2: 90%  93% 97%  Weight:      Height:       Physical Exam HENT:     Head: Normocephalic.  Eyes:     General: Lids are normal.     Conjunctiva/sclera: Conjunctivae normal.  Cardiovascular:     Rate and Rhythm: Normal rate and regular rhythm.     Heart sounds: Normal heart sounds, S1 normal and S2 normal.  Pulmonary:     Breath sounds: Examination of the right-lower field reveals decreased breath sounds. Examination of the left-lower field reveals decreased breath sounds. Decreased breath sounds present. No wheezing or rhonchi.  Abdominal:     Palpations: Abdomen is soft.     Tenderness: There is no abdominal tenderness.  Musculoskeletal:     Right lower leg: Swelling present.     Left lower leg: Swelling present.  Skin:    General: Skin is warm.     Findings: No rash.  Neurological:     Mental Status: He is alert and oriented to person, place, and time.     Data Reviewed: Creatinine creeped up to 1.74.  Hold this afternoon's torsemide, potassium 3.4, hemoglobin 8.1 Disposition: Status is: Inpatient Remains inpatient appropriate because: Will give IV iron today with hemoglobin dropping down to 8.1.  Patient agreeable to an IV.  Planned Discharge Destination: Home with home health hopefully still on 04/22/2023    Time spent: 27 minutes  Author: Alford Highland, MD 04/11/2023 5:24 PM  For on call review [email or web address].

## 2023-04-12 DIAGNOSIS — I5033 Acute on chronic diastolic (congestive) heart failure: Secondary | ICD-10-CM | POA: Diagnosis not present

## 2023-04-12 LAB — BASIC METABOLIC PANEL
Anion gap: 8 (ref 5–15)
BUN: 23 mg/dL (ref 8–23)
CO2: 31 mmol/L (ref 22–32)
Calcium: 8 mg/dL — ABNORMAL LOW (ref 8.9–10.3)
Chloride: 100 mmol/L (ref 98–111)
Creatinine, Ser: 1.6 mg/dL — ABNORMAL HIGH (ref 0.61–1.24)
GFR, Estimated: 48 mL/min — ABNORMAL LOW (ref >60)
Glucose, Bld: 119 mg/dL — ABNORMAL HIGH (ref 70–99)
Potassium: 3.5 mmol/L (ref 3.5–5.1)
Sodium: 139 mmol/L (ref 135–145)

## 2023-04-12 LAB — TYPE AND SCREEN
ABO/RH(D): A NEG
Antibody Screen: NEGATIVE

## 2023-04-12 LAB — CBC
HCT: 30.7 % — ABNORMAL LOW (ref 39.0–52.0)
Hemoglobin: 8.2 g/dL — ABNORMAL LOW (ref 13.0–17.0)
MCH: 21.2 pg — ABNORMAL LOW (ref 26.0–34.0)
MCHC: 26.7 g/dL — ABNORMAL LOW (ref 30.0–36.0)
MCV: 79.3 fL — ABNORMAL LOW (ref 80.0–100.0)
Platelets: 236 10*3/uL (ref 150–400)
RBC: 3.87 MIL/uL — ABNORMAL LOW (ref 4.22–5.81)
RDW: 19.2 % — ABNORMAL HIGH (ref 11.5–15.5)
WBC: 7.6 10*3/uL (ref 4.0–10.5)
nRBC: 1 % — ABNORMAL HIGH (ref 0.0–0.2)

## 2023-04-12 NOTE — Plan of Care (Signed)

## 2023-04-12 NOTE — Progress Notes (Signed)
Physical Therapy Treatment Patient Details Name: Gary [name] MRN: [id number] DOB: Aug 27, 1955 Today's Date: 04/12/2023   History of Present Illness 67 y/o male presented to [facility] ED on 11/28/22 for chest pain x 4 days. Admitted for acute on chronic CHF. Frequent admissions this year with most recent dsicharge on 11/21/22. PMH includes obesity, hypoxia, respiratory failure with tracheostomy, COPD, GIB, HFpEF.    PT Comments  Pt willing to participate with PT services with encouragement.  Reported some mild dizziness in supine and sitting but declined BP assessment.  Pt reported that dizziness improved while sitting at EOB and requested to ambulate only, declined sit to/from stand transfer training from lower height surfaces despite encouragement and demanded the bed be raised to the maximum safe height prior to attempting to stand.  Pt able to stand from significantly elevated surface with no ease and then ambulate 100 feet with good stability and with one 10-15 sec standing rest break.  Pt reported no adverse symptoms with gait with SpO2 and HR WNL on 4L.  Pt returned to 2LO2/min as found at the end of the session with needs in place.  Pt will benefit from continued PT services upon discharge to safely address deficits listed in patient problem list for decreased caregiver assistance and eventual return to PLOF.     If plan is discharge home, recommend the following: A lot of help with bathing/dressing/bathroom;Assist for transportation;A lot of help with walking and/or transfers;Direct supervision/assist for medications management;Direct supervision/assist for financial management;Help with stairs or ramp for entrance;Supervision due to cognitive status   Can travel by private vehicle     No  Equipment Recommendations  Other (comment) (TBD)    Recommendations for Other Services       Precautions / Restrictions Precautions Precautions: Fall Precaution Comments: capped  trach Restrictions Weight Bearing Restrictions Per Provider Order: No     Mobility  Bed Mobility Overal bed mobility: Modified Independent             General bed mobility comments: Min extra time, effort, use of rail with HOB elevated    Transfers Overall transfer level: Needs assistance Equipment used: Rolling walker (2 wheels) Transfers: Sit to/from Stand Sit to Stand: From elevated surface, Supervision           General transfer comment: Pt demanded bed to be at max safe height in order to attempt to stand, declined attempts at standing at more moderate height to facilitate functional strength gains    Ambulation/Gait Ambulation/Gait assistance: +2 safety/equipment, Supervision Gait Distance (Feet): 100 Feet Assistive device: Rolling walker (2 wheels) Gait Pattern/deviations: WFL(Within Functional Limits), Step-through pattern Gait velocity: decreased     General Gait Details: One standing rest break during amb with good stability throughout and SpO2 and HR WNL on 4LO2/min   Stairs             Wheelchair Mobility     Tilt Bed    Modified Rankin (Stroke Patients Only)       Balance Overall balance assessment: Needs assistance Sitting-balance support: No upper extremity supported Sitting balance-[name] Scale: Normal     Standing balance support: Bilateral upper extremity supported, During functional activity, Reliant on assistive device for balance Standing balance-[name] Scale: Fair                              Cognition Arousal: Alert Behavior During Therapy: WFL for tasks assessed/performed Overall Cognitive Status: Within Functional  Limits for tasks assessed                                          Exercises Other Exercises Other Exercises: Pt education provided on benefits from sit to/from stand transfer training from progressively lower surfaces as able to improve functional strength    General  Comments        Pertinent Vitals/Pain Pain Assessment Pain Assessment: No/denies pain    Home Living                          Prior Function            PT Goals (current goals can now be found in the care plan section) Progress towards PT goals: Not progressing toward goals - comment (limited by functional strength deficits with transfers, rarely will allow therapy to guide interventions to address the deficit)    Frequency    Min 1X/week      PT Plan Current plan remains appropriate    Co-evaluation              AM-PAC PT "6 Clicks" Mobility   Outcome Measure  Help needed turning from your back to your side while in a flat bed without using bedrails?: None Help needed moving from lying on your back to sitting on the side of a flat bed without using bedrails?: A Little Help needed moving to and from a bed to a chair (including a wheelchair)?: A Little Help needed standing up from a chair using your arms (e.g., wheelchair or bedside chair)?: A Lot Help needed to walk in hospital room?: A Lot Help needed climbing 3-5 steps with a railing? : A Lot 6 Click Score: 16    End of Session Equipment Utilized During Treatment: Oxygen Activity Tolerance: Patient tolerated treatment well Patient left: in chair;with call bell/phone within reach Nurse Communication: Mobility status PT Visit Diagnosis: Muscle weakness (generalized) (M62.81);Difficulty in walking, not elsewhere classified (R26.2)     Time: 1610-9604 PT Time Calculation (min) (ACUTE ONLY): 23 min  Charges:    $Gait Training: 8-22 mins $Therapeutic Activity: 8-22 mins PT General Charges $$ ACUTE PT VISIT: 1 Visit                     D. Scott Meryem Haertel PT, DPT 04/12/23, 3:42 PM

## 2023-04-12 NOTE — Progress Notes (Signed)
PROGRESS NOTE    Gary [name]  YQI:[id number] DOB: 1955/12/13 DOA: 11/28/2022 PCP: Shayne Alken, MD   Assessment & Plan:   Principal Problem:   Acute blood loss anemia Active Problems:   Melena   Acute on chronic diastolic CHF (congestive heart failure) ([name])   Acute on chronic respiratory failure with hypoxia and hypercapnia ([name])   COPD (chronic obstructive pulmonary disease) ([name])   Acute kidney injury superimposed on CKD ([name])   OSA (obstructive sleep apnea)   Anxiety   Hypotension   Chest pain   Morbid obesity with BMI of 50.0-59.9, adult ([name])   Generalized weakness   Chronic colitis   Fluid overload   Eyelid cyst, right   Dry skin   Tracheitis  Assessment and Plan: Acute blood loss anemia: possibly secondary to melena. Will transfuse if Hb < 7.0. Holding eliquis. Cased discussed w/ GI by Dr. Renae Gloss who decided to hold off on any scopes at this time as pt had a colonoscopy in May and endoscopy in October. S/p IV iron. Can consider bleeding scan   Difficult to place due to tracheostomy and weight: continue w/ supportive care. See CM's notes.    Acute on chronic diastolic LOV:FIEPPIRJJ admitted for volume overloaded. Appears euvolemic currently. Continue on torsemide, aldactone    Atypical chest pain: resolved    Acute on chronic hypoxic & hypercapnic respiratory failure: continue on trach collar. S/p abx course for pseudomonas tracheitis. Also evaluated for non-massive hemoptysis with CT soft tissue of neck done on 02/08/2023.  Tracheostomy tube is in place and there is no definite collection around the tube.  There is thinning of the wall between the esophagus and trachea with possible defect. Stable currently on 2L [place]   COPD: severe. Bronchodilators prn    AKI on CKDIIIa: Cr is labile. Avoid nephrotoxic meds    OSA: CPAP qhs   Right eyelid cyst: lanced by ophthalmology    Chronic colitis: continue w/ supportive care. No longer taking mesalamine     Generalized weakness: PT/OT continues to work with the pt. Pt has been more motivated and working with physical therapy and mobility specialist, however, resistant to certain important exercises such as getting up from seated position to standing.   Depression & anxiety: severity unknown. Continue on lexapro    Morbid obesity: BMI 52.1. Complicates overall care & prognosis   Constipation: resolved       DVT prophylaxis:SCDs Code Status: full  Family Communication:  Disposition Plan: likely d/c back home w/ HH   Level of care: Med-Surg  Status is: Inpatient Remains inpatient appropriate because: severity of illness    Consultants:  Pulmon   Procedures:   Antimicrobials:    Subjective: Pt c/o fatigue   Objective: Vitals:   04/11/23 2305 04/12/23 0357 04/12/23 0500 04/12/23 0827  BP:  116/82  111/77  Pulse:  84  79  Resp:  20  20  Temp:  98.1 F (36.7 C)  98.2 F (36.8 C)  TempSrc:      SpO2: 98% 93%  94%  Weight:   (!) 191.9 kg   Height:        Intake/Output Summary (Last 24 hours) at 04/12/2023 0947 Last data filed at 04/12/2023 0400 Gross per 24 hour  Intake 325.1 ml  Output 2125 ml  Net -1799.9 ml   Filed Weights   04/08/23 0500 04/11/23 0500 04/12/23 0500  Weight: (!) 190.2 kg (!) 196.2 kg (!) 191.9 kg    Examination:  General exam: Appears calm and comfortable  Respiratory system: Clear to auscultation. Respiratory effort normal. Cardiovascular system: S1 & S2+. No rubs, gallops or clicks. Gastrointestinal system: Abdomen is nondistended, soft and nontender.  Normal bowel sounds heard. Central nervous system: Alert and oriented. Moves all extremities  Psychiatry: Judgement and insight appears at baseline. Flat mood and affect    Data Reviewed: I have personally reviewed following labs and imaging studies  CBC: Recent Labs  Lab 04/08/23 2333 04/09/23 0626 04/09/23 1452 04/10/23 0525 04/11/23 0929 04/12/23 0605  WBC 9.3 8.2  --   7.9  --  7.6  HGB 9.3* 9.5* 8.9* 8.6* 8.1* 8.2*  HCT 34.0* 34.9*  --  31.4*  --  30.7*  MCV 76.7* 78.3*  --  76.8*  --  79.3*  PLT 225 230  --  230  --  236   Basic Metabolic Panel: Recent Labs  Lab 04/09/23 0626 04/10/23 0525 04/11/23 0929 04/12/23 0605  NA 136 136 137 139  K 3.6 3.6 3.4* 3.5  CL 99 99 100 100  CO2 29 30 30 31   GLUCOSE 100* 126* 140* 119*  BUN 36* 31* 27* 23  CREATININE 1.60* 1.59* 1.74* 1.60*  CALCIUM 8.2* 7.9* 7.6* 8.0*   GFR: Estimated Creatinine Clearance: 83.9 mL/min (A) (by C-G formula based on SCr of 1.6 mg/dL (H)). Liver Function Tests: No results for input(s): "AST", "ALT", "ALKPHOS", "BILITOT", "PROT", "ALBUMIN" in the last 168 hours. No results for input(s): "LIPASE", "AMYLASE" in the last 168 hours. No results for input(s): "AMMONIA" in the last 168 hours. Coagulation Profile: No results for input(s): "INR", "PROTIME" in the last 168 hours. Cardiac Enzymes: No results for input(s): "CKTOTAL", "CKMB", "CKMBINDEX", "TROPONINI" in the last 168 hours. BNP (last 3 results) No results for input(s): "PROBNP" in the last 8760 hours. HbA1C: No results for input(s): "HGBA1C" in the last 72 hours. CBG: No results for input(s): "GLUCAP" in the last 168 hours. Lipid Profile: No results for input(s): "CHOL", "HDL", "LDLCALC", "TRIG", "CHOLHDL", "LDLDIRECT" in the last 72 hours. Thyroid Function Tests: No results for input(s): "TSH", "T4TOTAL", "FREET4", "T3FREE", "THYROIDAB" in the last 72 hours. Anemia Panel: No results for input(s): "VITAMINB12", "FOLATE", "FERRITIN", "TIBC", "IRON", "RETICCTPCT" in the last 72 hours. Sepsis Labs: No results for input(s): "PROCALCITON", "LATICACIDVEN" in the last 168 hours.  No results found for this or any previous visit (from the past 240 hours).       Radiology Studies: No results found.      Scheduled Meds:  amphetamine-dextroamphetamine  30 mg Oral Q breakfast   bacitracin   Topical Daily    clotrimazole   Topical BID   escitalopram  10 mg Oral Daily   Gerhardt's butt cream   Topical Daily   guaiFENesin  1,200 mg Oral BID   Ipratropium-Albuterol  1 puff Inhalation Q6H   Mesalamine  800 mg Oral BID   pantoprazole  40 mg Oral BID   spironolactone  25 mg Oral Daily   torsemide  40 mg Oral BID   Continuous Infusions:   LOS: 133 days       Gary Killian, MD [facility] Pager [phone]  If 7PM-7AM, please contact night-coverage [email or web address] 04/12/2023, 9:47 AM

## 2023-04-12 NOTE — Progress Notes (Signed)
Mobility Specialist - Progress Note   04/12/23 1613  Mobility  Activity Transferred from chair to bed  Level of Assistance +2 (takes two people)  Programmer, systems / Overhead Lift  Activity Response Tolerated well  Mobility visit 1 Mobility  Mobility Specialist Start Time (ACUTE ONLY) 1506  Mobility Specialist Stop Time (ACUTE ONLY) 1518  Mobility Specialist Time Calculation (min) (ACUTE ONLY) 12 min   Pt sitting in the recliner upon entry, utilizing 2L. Denied STS from recliner d/t feeling weak. Pt transferred to bed via hoyer lift +2 for safety. Pt left supine with alarm set and needs within reach.    Zetta Bills Mobility Specialist 04/12/23 4:21 PM
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
## 2023-04-13 DIAGNOSIS — I5033 Acute on chronic diastolic (congestive) heart failure: Secondary | ICD-10-CM | POA: Diagnosis not present

## 2023-04-13 LAB — CBC
HCT: 30.7 % — ABNORMAL LOW (ref 39.0–52.0)
Hemoglobin: 8.3 g/dL — ABNORMAL LOW (ref 13.0–17.0)
MCH: 21.3 pg — ABNORMAL LOW (ref 26.0–34.0)
MCHC: 27 g/dL — ABNORMAL LOW (ref 30.0–36.0)
MCV: 78.7 fL — ABNORMAL LOW (ref 80.0–100.0)
Platelets: 251 10*3/uL (ref 150–400)
RBC: 3.9 MIL/uL — ABNORMAL LOW (ref 4.22–5.81)
RDW: 19.3 % — ABNORMAL HIGH (ref 11.5–15.5)
WBC: 8.5 10*3/uL (ref 4.0–10.5)
nRBC: 1.9 % — ABNORMAL HIGH (ref 0.0–0.2)

## 2023-04-13 LAB — BASIC METABOLIC PANEL
Anion gap: 9 (ref 5–15)
BUN: 24 mg/dL — ABNORMAL HIGH (ref 8–23)
CO2: 31 mmol/L (ref 22–32)
Calcium: 8.3 mg/dL — ABNORMAL LOW (ref 8.9–10.3)
Chloride: 102 mmol/L (ref 98–111)
Creatinine, Ser: 1.48 mg/dL — ABNORMAL HIGH (ref 0.61–1.24)
GFR, Estimated: 52 mL/min — ABNORMAL LOW (ref >60)
Glucose, Bld: 120 mg/dL — ABNORMAL HIGH (ref 70–99)
Potassium: 3.5 mmol/L (ref 3.5–5.1)
Sodium: 142 mmol/L (ref 135–145)

## 2023-04-13 MED ORDER — POLYETHYLENE GLYCOL 3350 17 G PO PACK
17.0000 g | PACK | Freq: Every day | ORAL | Status: DC | PRN
  Administered 2023-04-13 – 2023-04-14 (×2): 17 g via ORAL
  Filled 2023-04-13 (×2): qty 1

## 2023-04-13 NOTE — Progress Notes (Signed)
PROGRESS NOTE    Gary [name]  NUU:[id number] DOB: 08-16-1955 DOA: 11/28/2022 PCP: Shayne Alken, MD   Assessment & Plan:   Principal Problem:   Acute blood loss anemia Active Problems:   Melena   Acute on chronic diastolic CHF (congestive heart failure) ([name])   Acute on chronic respiratory failure with hypoxia and hypercapnia ([name])   COPD (chronic obstructive pulmonary disease) ([name])   Acute kidney injury superimposed on CKD ([name])   OSA (obstructive sleep apnea)   Anxiety   Hypotension   Chest pain   Morbid obesity with BMI of 50.0-59.9, adult ([name])   Generalized weakness   Chronic colitis   Fluid overload   Eyelid cyst, right   Dry skin   Tracheitis  Assessment and Plan: Acute blood loss anemia: possibly secondary to melena. H&H are trending up today. Will transfuse if Hb < 7.0. Holding eliquis. Cased discussed w/ GI by Dr. Renae Gloss who decided to hold off on any scopes at this time as pt had a colonoscopy in May and endoscopy in October. S/p IV iron. Can consider bleeding scan   Difficult to place due to tracheostomy and weight: continue w/ supportive care. See CM's notes.    Acute on chronic diastolic [id number] admitted for volume overloaded. Appears euvolemic currently. Continue on aldactone, torsemide    Atypical chest pain: resolved    Acute on chronic hypoxic & hypercapnic respiratory failure: continue on trach collar. S/p abx course for pseudomonas tracheitis. Also evaluated for non-massive hemoptysis with CT soft tissue of neck done on 02/08/2023.  Tracheostomy tube is in place and there is no definite collection around the tube.  There is thinning of the wall between the esophagus and trachea with possible defect. Stable currently on 2L    COPD: severe. Bronchodilators prn    AKI on CKDIIIa: Cr is trending down from day prior. Avoid nephrotoxic meds    OSA: CPAP qhs   Right eyelid cyst: lanced by ophthalmology    Chronic colitis: continue w/  supportive care    Generalized weakness: PT/OT continues to work with the pt. Pt has been more motivated and working with physical therapy and mobility specialist, however, resistant to certain important exercises such as getting up from seated position to standing.   Depression & anxiety: severity unknown. Continue on lexapro    Morbid obesity: BMI 52.1. Complicates overall care & prognosis    Constipation: resolved       DVT prophylaxis:SCDs Code Status: full  Family Communication:  Disposition Plan: likely d/c back home w/ HH   Level of care: Med-Surg  Status is: Inpatient Remains inpatient appropriate because: severity of illness    Consultants:  Pulmon   Procedures:   Antimicrobials:    Subjective: Pt c/o malaise   Objective: Vitals:   04/12/23 2300 04/13/23 0442 04/13/23 0443 04/13/23 0749  BP:  104/72  (!) 103/59  Pulse:  80  81  Resp:  18  15  Temp:  98 F (36.7 C)  97.8 F (36.6 C)  TempSrc:      SpO2: 95% 93%  96%  Weight:   (!) 193.6 kg   Height:        Intake/Output Summary (Last 24 hours) at 04/13/2023 0820 Last data filed at 04/13/2023 0500 Gross per 24 hour  Intake 720 ml  Output 2100 ml  Net -1380 ml   Filed Weights   04/11/23 0500 04/12/23 0500 04/13/23 0443  Weight: (!) 196.2 kg (!) 191.9 kg [name])  193.6 kg    Examination:  General exam: appears comfortable. Morbidly obese  Respiratory system: decreased breath sounds b/l  Cardiovascular system: S1/S2+. No rubs or clicks  Gastrointestinal system: abd is soft, NT, obese & hypoactive bowel sounds  Central nervous system: alert & oriented. Moves all extremities  Psychiatry: Judgement and insight appears at baseline. Flat mood and affect     Data Reviewed: I have personally reviewed following labs and imaging studies  CBC: Recent Labs  Lab 04/08/23 2333 04/09/23 0626 04/09/23 1452 04/10/23 0525 04/11/23 0929 04/12/23 0605  WBC 9.3 8.2  --  7.9  --  7.6  HGB 9.3* 9.5*  8.9* 8.6* 8.1* 8.2*  HCT 34.0* 34.9*  --  31.4*  --  30.7*  MCV 76.7* 78.3*  --  76.8*  --  79.3*  PLT 225 230  --  230  --  236   Basic Metabolic Panel: Recent Labs  Lab 04/09/23 0626 04/10/23 0525 04/11/23 0929 04/12/23 0605  NA 136 136 137 139  K 3.6 3.6 3.4* 3.5  CL 99 99 100 100  CO2 29 30 30 31   GLUCOSE 100* 126* 140* 119*  BUN 36* 31* 27* 23  CREATININE 1.60* 1.59* 1.74* 1.60*  CALCIUM 8.2* 7.9* 7.6* 8.0*   GFR: Estimated Creatinine Clearance: 84.3 mL/min (A) (by C-G formula based on SCr of 1.6 mg/dL (H)). Liver Function Tests: No results for input(s): "AST", "ALT", "ALKPHOS", "BILITOT", "PROT", "ALBUMIN" in the last 168 hours. No results for input(s): "LIPASE", "AMYLASE" in the last 168 hours. No results for input(s): "AMMONIA" in the last 168 hours. Coagulation Profile: No results for input(s): "INR", "PROTIME" in the last 168 hours. Cardiac Enzymes: No results for input(s): "CKTOTAL", "CKMB", "CKMBINDEX", "TROPONINI" in the last 168 hours. BNP (last 3 results) No results for input(s): "PROBNP" in the last 8760 hours. HbA1C: No results for input(s): "HGBA1C" in the last 72 hours. CBG: No results for input(s): "GLUCAP" in the last 168 hours. Lipid Profile: No results for input(s): "CHOL", "HDL", "LDLCALC", "TRIG", "CHOLHDL", "LDLDIRECT" in the last 72 hours. Thyroid Function Tests: No results for input(s): "TSH", "T4TOTAL", "FREET4", "T3FREE", "THYROIDAB" in the last 72 hours. Anemia Panel: No results for input(s): "VITAMINB12", "FOLATE", "FERRITIN", "TIBC", "IRON", "RETICCTPCT" in the last 72 hours. Sepsis Labs: No results for input(s): "PROCALCITON", "LATICACIDVEN" in the last 168 hours.  No results found for this or any previous visit (from the past 240 hours).       Radiology Studies: No results found.      Scheduled Meds:  amphetamine-dextroamphetamine  30 mg Oral Q breakfast   bacitracin   Topical Daily   clotrimazole   Topical BID    escitalopram  10 mg Oral Daily   Gerhardt's butt cream   Topical Daily   guaiFENesin  1,200 mg Oral BID   Ipratropium-Albuterol  1 puff Inhalation Q6H   Mesalamine  800 mg Oral BID   pantoprazole  40 mg Oral BID   spironolactone  25 mg Oral Daily   torsemide  40 mg Oral BID   Continuous Infusions:   LOS: 134 days       Charise Killian, MD [facility] Pager [phone]  If 7PM-7AM, please contact night-coverage [email or web address] 04/13/2023, 8:20 AM

## 2023-04-13 NOTE — Progress Notes (Signed)
Mobility Specialist - Progress Note    04/13/23 1103  Mobility  Activity Ambulated with assistance in hallway;Stood at bedside  Level of Assistance +2 (takes two people) (Chair Follow)  Assistive Device Front wheel walker  Distance Ambulated (ft) 90 ft  Range of Motion/Exercises Active  RLE Weight Bearing Per Provider Order WBAT  Activity Response Tolerated well  Mobility Referral Yes  Mobility visit 1 Mobility  Mobility Specialist Start Time (ACUTE ONLY) 1037  Mobility Specialist Stop Time (ACUTE ONLY) 1104  Mobility Specialist Time Calculation (min) (ACUTE ONLY) 27 min   Pt resting in bed on 2L upon entry. Pt STS and ambulates to hallway around NS +2 (chair follow) with RW for 90 Ft on 4L. Pt endorses SOB and pain in arms after 90 ft. Pt sits in recliner and wheeled back to room. Pt transferred to bed using overhead maxisky lift. Pt left in bed on 2L with needs in reach.   Johnathan Hausen Mobility Specialist 04/13/23, 11:11 AM

## 2023-04-13 NOTE — Plan of Care (Signed)
  Problem: Activity: Goal: Risk for activity intolerance will decrease Outcome: Progressing   Problem: Nutrition: Goal: Adequate nutrition will be maintained Outcome: Progressing   Problem: Safety: Goal: Ability to remain free from injury will improve Outcome: Progressing   Problem: Pain Managment: Goal: General experience of comfort will improve Outcome: Progressing

## 2023-04-14 DIAGNOSIS — I5033 Acute on chronic diastolic (congestive) heart failure: Secondary | ICD-10-CM | POA: Diagnosis not present

## 2023-04-14 LAB — CBC
HCT: 31.5 % — ABNORMAL LOW (ref 39.0–52.0)
Hemoglobin: 8.6 g/dL — ABNORMAL LOW (ref 13.0–17.0)
MCH: 21.5 pg — ABNORMAL LOW (ref 26.0–34.0)
MCHC: 27.3 g/dL — ABNORMAL LOW (ref 30.0–36.0)
MCV: 78.8 fL — ABNORMAL LOW (ref 80.0–100.0)
Platelets: 246 10*3/uL (ref 150–400)
RBC: 4 MIL/uL — ABNORMAL LOW (ref 4.22–5.81)
RDW: 19.5 % — ABNORMAL HIGH (ref 11.5–15.5)
WBC: 7.9 10*3/uL (ref 4.0–10.5)
nRBC: 2.9 % — ABNORMAL HIGH (ref 0.0–0.2)

## 2023-04-14 LAB — BASIC METABOLIC PANEL
Anion gap: 8 (ref 5–15)
BUN: 25 mg/dL — ABNORMAL HIGH (ref 8–23)
CO2: 32 mmol/L (ref 22–32)
Calcium: 8.2 mg/dL — ABNORMAL LOW (ref 8.9–10.3)
Chloride: 99 mmol/L (ref 98–111)
Creatinine, Ser: 1.53 mg/dL — ABNORMAL HIGH (ref 0.61–1.24)
GFR, Estimated: 50 mL/min — ABNORMAL LOW (ref >60)
Glucose, Bld: 99 mg/dL (ref 70–99)
Potassium: 3.7 mmol/L (ref 3.5–5.1)
Sodium: 139 mmol/L (ref 135–145)

## 2023-04-14 MED ORDER — POLYVINYL ALCOHOL 1.4 % OP SOLN: 1.0000 [drp] | OPHTHALMIC | Status: DC | PRN

## 2023-04-14 NOTE — Progress Notes (Signed)
PROGRESS NOTE    Gary [name]  ZOX:[id number] DOB: 04/07/1956 DOA: 11/28/2022 PCP: Shayne Alken, MD   Assessment & Plan:   Principal Problem:   Acute blood loss anemia Active Problems:   Melena   Acute on chronic diastolic CHF (congestive heart failure) ([name])   Acute on chronic respiratory failure with hypoxia and hypercapnia ([name])   COPD (chronic obstructive pulmonary disease) ([name])   Acute kidney injury superimposed on CKD ([name])   OSA (obstructive sleep apnea)   Anxiety   Hypotension   Chest pain   Morbid obesity with BMI of 50.0-59.9, adult ([name])   Generalized weakness   Chronic colitis   Fluid overload   Eyelid cyst, right   Dry skin   Tracheitis  Assessment and Plan: Acute blood loss anemia: possibly secondary to melena. H&H are trending up today. Will transfuse if Hb < 7.0. Holding eliquis. Cased discussed w/ GI by Dr. Renae Gloss who decided to hold off on any scopes at this time as pt had a colonoscopy in May and endoscopy in October. S/p IV iron. Can consider bleeding scan   Difficult to place due to tracheostomy and weight: continue w/ supportive care. See CM's notes.    Acute on chronic diastolic WJX:[id number] admitted for volume overloaded. Appears euvolemic currently. Continue on torsemide, aldactone    Atypical chest pain: resolved    Acute on chronic hypoxic & hypercapnic respiratory failure: continue on trach collar. S/p abx course for pseudomonas tracheitis. Also evaluated for non-massive hemoptysis with CT soft tissue of neck done on 02/08/2023.  Tracheostomy tube is in place and there is no definite collection around the tube.  There is thinning of the wall between the esophagus and trachea with possible defect. Stable currently on 2L [place]   COPD: severe. Bronchodilators prn    AKI on CKDIIIa: Cr is labile. Avoid nephrotoxic meds    OSA: CPAP qhs   Right eyelid cyst: lanced by ophthalmology    Chronic colitis: continue w/ supportive care     Generalized weakness: PT/OT continues to work with the pt. Pt has been more motivated and working with physical therapy and mobility specialist, however, resistant to certain important exercises such as getting up from seated position to standing.   Depression & anxiety: severity unknown. Continue on lexapro    Morbid obesity: BMI 52.1. Complicates overall care & prognosis    Constipation: resolved       DVT prophylaxis:SCDs Code Status: full  Family Communication:  Disposition Plan: likely d/c back home w/ HH   Level of care: Med-Surg  Status is: Inpatient Remains inpatient appropriate because: severity of illness    Consultants:  Pulmon   Procedures:   Antimicrobials:    Subjective: Pt c/o fatigue   Objective: Vitals:   04/14/23 0441 04/14/23 0523 04/14/23 0736 04/14/23 0745  BP: 111/71  (!) 98/58   Pulse: 84  83 84  Resp: 20  16   Temp: 98.5 F (36.9 C)  97.9 F (36.6 C)   TempSrc:      SpO2: 94%  (!) 84% 93%  Weight:  (!) 191.2 kg    Height:        Intake/Output Summary (Last 24 hours) at 04/14/2023 0807 Last data filed at 04/14/2023 0443 Gross per 24 hour  Intake 240 ml  Output 2200 ml  Net -1960 ml   Filed Weights   04/12/23 0500 04/13/23 0443 04/14/23 0523  Weight: (!) 191.9 kg (!) 193.6 kg (!) 191.2 kg  Examination:  General exam: appears comfortable. Morbidly obese Respiratory system: diminished breath sounds b/l   Cardiovascular system: S1/S2+. No rubs or clicks  Gastrointestinal system: abd is soft, NT, obese, hypoactive bowel sounds Central nervous system: alert & oriented. Moves all extremities Psychiatry: Judgement and insight appears at baseline. Flat mood and affect     Data Reviewed: I have personally reviewed following labs and imaging studies  CBC: Recent Labs  Lab 04/09/23 0626 04/09/23 1452 04/10/23 0525 04/11/23 0929 04/12/23 0605 04/13/23 0844 04/14/23 0600  WBC 8.2  --  7.9  --  7.6 8.5 7.9  HGB 9.5*   <  > 8.6* 8.1* 8.2* 8.3* 8.6*  HCT 34.9*  --  31.4*  --  30.7* 30.7* 31.5*  MCV 78.3*  --  76.8*  --  79.3* 78.7* 78.8*  PLT 230  --  230  --  236 251 246   < > = values in this interval not displayed.   Basic Metabolic Panel: Recent Labs  Lab 04/10/23 0525 04/11/23 0929 04/12/23 0605 04/13/23 0844 04/14/23 0600  NA 136 137 139 142 139  K 3.6 3.4* 3.5 3.5 3.7  CL 99 100 100 102 99  CO2 30 30 31 31  32  GLUCOSE 126* 140* 119* 120* 99  BUN 31* 27* 23 24* 25*  CREATININE 1.59* 1.74* 1.60* 1.48* 1.53*  CALCIUM 7.9* 7.6* 8.0* 8.3* 8.2*   GFR: Estimated Creatinine Clearance: 87.6 mL/min (A) (by C-G formula based on SCr of 1.53 mg/dL (H)). Liver Function Tests: No results for input(s): "AST", "ALT", "ALKPHOS", "BILITOT", "PROT", "ALBUMIN" in the last 168 hours. No results for input(s): "LIPASE", "AMYLASE" in the last 168 hours. No results for input(s): "AMMONIA" in the last 168 hours. Coagulation Profile: No results for input(s): "INR", "PROTIME" in the last 168 hours. Cardiac Enzymes: No results for input(s): "CKTOTAL", "CKMB", "CKMBINDEX", "TROPONINI" in the last 168 hours. BNP (last 3 results) No results for input(s): "PROBNP" in the last 8760 hours. HbA1C: No results for input(s): "HGBA1C" in the last 72 hours. CBG: No results for input(s): "GLUCAP" in the last 168 hours. Lipid Profile: No results for input(s): "CHOL", "HDL", "LDLCALC", "TRIG", "CHOLHDL", "LDLDIRECT" in the last 72 hours. Thyroid Function Tests: No results for input(s): "TSH", "T4TOTAL", "FREET4", "T3FREE", "THYROIDAB" in the last 72 hours. Anemia Panel: No results for input(s): "VITAMINB12", "FOLATE", "FERRITIN", "TIBC", "IRON", "RETICCTPCT" in the last 72 hours. Sepsis Labs: No results for input(s): "PROCALCITON", "LATICACIDVEN" in the last 168 hours.  No results found for this or any previous visit (from the past 240 hours).       Radiology Studies: No results found.      Scheduled Meds:   amphetamine-dextroamphetamine  30 mg Oral Q breakfast   bacitracin   Topical Daily   clotrimazole   Topical BID   escitalopram  10 mg Oral Daily   Gerhardt's butt cream   Topical Daily   guaiFENesin  1,200 mg Oral BID   Ipratropium-Albuterol  1 puff Inhalation Q6H   Mesalamine  800 mg Oral BID   pantoprazole  40 mg Oral BID   spironolactone  25 mg Oral Daily   torsemide  40 mg Oral BID   Continuous Infusions:   LOS: 135 days       Charise Killian, MD [facility] Pager [phone]  If 7PM-7AM, please contact night-coverage [email or web address] 04/14/2023, 8:07 AM

## 2023-04-14 NOTE — Plan of Care (Signed)

## 2023-04-15 DIAGNOSIS — I5033 Acute on chronic diastolic (congestive) heart failure: Secondary | ICD-10-CM | POA: Diagnosis not present

## 2023-04-15 MED ORDER — LACTULOSE 10 GM/15ML PO SOLN
20.0000 g | Freq: Two times a day (BID) | ORAL | Status: DC | PRN
  Administered 2023-04-15: 20 g via ORAL
  Filled 2023-04-15 (×2): qty 30

## 2023-04-15 MED ORDER — BISACODYL 10 MG RE SUPP
10.0000 mg | Freq: Every day | RECTAL | Status: DC | PRN
  Administered 2023-04-15 – 2023-04-22 (×5): 10 mg via RECTAL
  Filled 2023-04-15 (×5): qty 1

## 2023-04-15 NOTE — Progress Notes (Addendum)
PROGRESS NOTE    Gary [name]  RUE:[id number] DOB: 02-01-1956 DOA: 11/28/2022 PCP: Shayne Alken, MD   Assessment & Plan:   Principal Problem:   Acute blood loss anemia Active Problems:   Melena   Acute on chronic diastolic CHF (congestive heart failure) ([name])   Acute on chronic respiratory failure with hypoxia and hypercapnia ([name])   COPD (chronic obstructive pulmonary disease) ([name])   Acute kidney injury superimposed on CKD ([name])   OSA (obstructive sleep apnea)   Anxiety   Hypotension   Chest pain   Morbid obesity with BMI of 50.0-59.9, adult ([name])   Generalized weakness   Chronic colitis   Fluid overload   Eyelid cyst, right   Dry skin   Tracheitis  Assessment and Plan: Acute blood loss anemia: possibly secondary to melena. H&H are labile. Will transfuse if Hb < 7.0. Holding eliquis. Cased discussed w/ GI by Dr. Renae Gloss who decided to hold off on any scopes at this time as pt had a colonoscopy in May and endoscopy in October. S/p IV iron. Can consider bleeding scan   Difficult to place due to tracheostomy and weight: continue w/ supportive care. See CM's notes.    Acute on chronic diastolic JYN:WGNFAOZHY admitted for volume overloaded. Appears euvolemic currently. Continue on torsemide, aldactone    Atypical chest pain: resolved    Acute on chronic hypoxic & hypercapnic respiratory failure: continue on trach collar. S/p abx course for pseudomonas tracheitis. Also evaluated for non-massive hemoptysis with CT soft tissue of neck done on 02/08/2023.  Tracheostomy tube is in place and there is no definite collection around the tube.  There is thinning of the wall between the esophagus and trachea with possible defect. Stable currently on 2L [place]   COPD: severe. Bronchodilators prn     AKI on CKDIIIa: Cr is labile. Avoid nephrotoxic meds    OSA: CPAP qhs   Right eyelid cyst: lanced by ophthalmology    Chronic colitis: continue w/ supportive care     Generalized weakness: PT/OT continues to work with the pt. Pt has been more motivated and working with physical therapy and mobility specialist, however, resistant to certain important exercises such as getting up from seated position to standing.   Depression & anxiety: severity unknown. Continue on lexapro    Morbid obesity: BMI 52.1. Complicates overall care & prognosis    Constipation: lactulose prn        DVT prophylaxis:SCDs Code Status: full  Family Communication:  Disposition Plan: likely d/c back home w/ HH   Level of care: Med-Surg  Status is: Inpatient Remains inpatient appropriate because: severity of illness    Consultants:  Pulmon   Procedures:   Antimicrobials:    Subjective: Pt c/o constipation   Objective: Vitals:   04/14/23 2002 04/15/23 0111 04/15/23 0510 04/15/23 0748  BP: 117/65  110/77 108/75  Pulse: 85  88 85  Resp: 16  15 19   Temp: 98 F (36.7 C)  98.3 F (36.8 C) 98 F (36.7 C)  TempSrc:    Oral  SpO2: (!) 87% (!) 89% (!) 87% 90%  Weight:      Height:        Intake/Output Summary (Last 24 hours) at 04/15/2023 0825 Last data filed at 04/14/2023 2030 Gross per 24 hour  Intake --  Output 225 ml  Net -225 ml   Filed Weights   04/12/23 0500 04/13/23 0443 04/14/23 0523  Weight: (!) 191.9 kg (!) 193.6 kg (!) 191.2  kg    Examination:  General exam: appears uncomfortable. Morbidly obese Respiratory system: decreased breath sounds b/l  Cardiovascular system: S1 & S2+ Gastrointestinal system: abd is soft, NT, obese, hypoactive bowel sounds  Central nervous system: alert & oriented.  Psychiatry: judgement and insight appears at baseline. Flat mood and affect     Data Reviewed: I have personally reviewed following labs and imaging studies  CBC: Recent Labs  Lab 04/09/23 0626 04/09/23 1452 04/10/23 0525 04/11/23 0929 04/12/23 0605 04/13/23 0844 04/14/23 0600  WBC 8.2  --  7.9  --  7.6 8.5 7.9  HGB 9.5*   < > 8.6* 8.1*  8.2* 8.3* 8.6*  HCT 34.9*  --  31.4*  --  30.7* 30.7* 31.5*  MCV 78.3*  --  76.8*  --  79.3* 78.7* 78.8*  PLT 230  --  230  --  236 251 246   < > = values in this interval not displayed.   Basic Metabolic Panel: Recent Labs  Lab 04/10/23 0525 04/11/23 0929 04/12/23 0605 04/13/23 0844 04/14/23 0600  NA 136 137 139 142 139  K 3.6 3.4* 3.5 3.5 3.7  CL 99 100 100 102 99  CO2 30 30 31 31  32  GLUCOSE 126* 140* 119* 120* 99  BUN 31* 27* 23 24* 25*  CREATININE 1.59* 1.74* 1.60* 1.48* 1.53*  CALCIUM 7.9* 7.6* 8.0* 8.3* 8.2*   GFR: Estimated Creatinine Clearance: 87.6 mL/min (A) (by C-G formula based on SCr of 1.53 mg/dL (H)). Liver Function Tests: No results for input(s): "AST", "ALT", "ALKPHOS", "BILITOT", "PROT", "ALBUMIN" in the last 168 hours. No results for input(s): "LIPASE", "AMYLASE" in the last 168 hours. No results for input(s): "AMMONIA" in the last 168 hours. Coagulation Profile: No results for input(s): "INR", "PROTIME" in the last 168 hours. Cardiac Enzymes: No results for input(s): "CKTOTAL", "CKMB", "CKMBINDEX", "TROPONINI" in the last 168 hours. BNP (last 3 results) No results for input(s): "PROBNP" in the last 8760 hours. HbA1C: No results for input(s): "HGBA1C" in the last 72 hours. CBG: No results for input(s): "GLUCAP" in the last 168 hours. Lipid Profile: No results for input(s): "CHOL", "HDL", "LDLCALC", "TRIG", "CHOLHDL", "LDLDIRECT" in the last 72 hours. Thyroid Function Tests: No results for input(s): "TSH", "T4TOTAL", "FREET4", "T3FREE", "THYROIDAB" in the last 72 hours. Anemia Panel: No results for input(s): "VITAMINB12", "FOLATE", "FERRITIN", "TIBC", "IRON", "RETICCTPCT" in the last 72 hours. Sepsis Labs: No results for input(s): "PROCALCITON", "LATICACIDVEN" in the last 168 hours.  No results found for this or any previous visit (from the past 240 hours).       Radiology Studies: No results found.      Scheduled Meds:   amphetamine-dextroamphetamine  30 mg Oral Q breakfast   bacitracin   Topical Daily   clotrimazole   Topical BID   escitalopram  10 mg Oral Daily   Gerhardt's butt cream   Topical Daily   guaiFENesin  1,200 mg Oral BID   Ipratropium-Albuterol  1 puff Inhalation Q6H   Mesalamine  800 mg Oral BID   pantoprazole  40 mg Oral BID   spironolactone  25 mg Oral Daily   torsemide  40 mg Oral BID   Continuous Infusions:   LOS: 136 days       Charise Killian, MD [facility] Pager [phone]  If 7PM-7AM, please contact night-coverage [email or web address] 04/15/2023, 8:25 AM

## 2023-04-15 NOTE — Progress Notes (Signed)
Occupational Therapy Treatment Patient Details Name: Gary [name] MRN: [id number] DOB: [birth year] Today's Date: 04/15/2023   History of present illness 67 y/o male presented to [facility] ED on 11/28/22 for chest pain x 4 days. Admitted for acute on chronic CHF. Frequent admissions this year with most recent dsicharge on 11/21/22. PMH includes obesity, hypoxia, respiratory failure with tracheostomy, COPD, GIB, HFpEF.   OT comments  Mr Gary [name] was seen for OT treatment on this date. Upon arrival to room pt in bed, reports abdomen discomfort but agreeable to tx. Pt requires no assist for bed mobility, tolerates ~10 min static sitting EOB. SUP + RW to stand from elevated bed height. Pt repeatedly states concern for bowel incontinence citing recent enema. Defers further mobility and returns to bed. Pt making progress toward goals, will continue to follow POC. Discharge recommendation remains appropriate.       If plan is discharge home, recommend the following:  A little help with walking and/or transfers;A lot of help with bathing/dressing/bathroom   Equipment Recommendations  BSC/3in1    Recommendations for Other Services      Precautions / Restrictions Precautions Precautions: Fall Restrictions Weight Bearing Restrictions Per Provider Order: No       Mobility Bed Mobility Overal bed mobility: Modified Independent                  Transfers Overall transfer level: Needs assistance Equipment used: Rolling walker (2 wheels) Transfers: Sit to/from Stand Sit to Stand: From elevated surface, Supervision                 Balance Overall balance assessment: Needs assistance Sitting-balance support: No upper extremity supported Sitting balance-[name] Scale: Normal     Standing balance support: Bilateral upper extremity supported, During functional activity, Reliant on assistive device for balance Standing balance-[name] Scale: Fair                             ADL  either performed or assessed with clinical judgement   ADL Overall ADL's : Needs assistance/impaired                                       General ADL Comments: MAX A don B shoes in sitting      Cognition Arousal: Alert Behavior During Therapy: WFL for tasks assessed/performed Overall Cognitive Status: Within Functional Limits for tasks assessed                                                     Pertinent Vitals/ Pain       Pain Assessment Pain Assessment: Faces Faces Pain Scale: Hurts little more Pain Location: abdomen Pain Descriptors / Indicators: Discomfort Pain Intervention(s): Limited activity within patient's tolerance, Repositioned   Frequency  Min 1X/week        Progress Toward Goals  OT Goals(current goals can now be found in the care plan section)  Progress towards OT goals: Progressing toward goals  Acute Rehab OT Goals OT Goal Formulation: With patient Time For Goal Achievement: 04/30/23 Potential to Achieve Goals: Good ADL Goals Pt Will Perform Grooming: with modified independence;standing Pt Will Transfer to Toilet: with modified independence;ambulating;regular height toilet Pt Will Perform Toileting -  Clothing Manipulation and hygiene: with mod assist;sit to/from stand Additional ADL Goal #1: Pt will stand from the hospital bed or recliner adn LR DME with min A as a precursor to ADLs  Plan Discharge plan remains appropriate;Frequency remains appropriate    Co-evaluation                 AM-PAC OT "6 Clicks" Daily Activity     Outcome Measure   Help from another person eating meals?: None Help from another person taking care of personal grooming?: A Little Help from another person toileting, which includes using toliet, bedpan, or urinal?: A Lot Help from another person bathing (including washing, rinsing, drying)?: A Lot Help from another person to put on and taking off regular upper body clothing?:  None Help from another person to put on and taking off regular lower body clothing?: A Lot 6 Click Score: 17    End of Session    OT Visit Diagnosis: Unsteadiness on feet (R26.81);Other abnormalities of gait and mobility (R26.89);Muscle weakness (generalized) (M62.81);Adult, failure to thrive (R62.7)   Activity Tolerance Patient tolerated treatment well   Patient Left with call bell/phone within reach;in bed   Nurse Communication          Time: 1601-0932 OT Time Calculation (min): 22 min  Charges: OT General Charges $OT Visit: 1 Visit OT Treatments $Therapeutic Activity: 8-22 mins  Kathie Dike, M.S. OTR/L  04/15/23, 1:36 PM  ascom [phone]
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
## 2023-04-15 NOTE — TOC Progression Note (Signed)
Transition of Care [facility]) - Progression Note    Patient Details  Name: Gary [name] MRN: [id number] Date of Birth: 08/19/1955  Transition of Care [facility]) CM/SW Contact  Garret Reddish, RN Phone Number: 04/15/2023, 10:22 AM  Clinical Narrative:    Chart reviewed.  Noted that patient with decreased hgb due to Melena.  Providers following Hgb.  Patient continues to work with PT.   TOC will continue to follow for discharge planning.     Expected Discharge Plan: Home w Home Health Services Barriers to Discharge: Continued Medical Work up  Expected Discharge Plan and Services     Post Acute Care Choice: Home Health   Expected Discharge Date: 02/26/23               DME Arranged: Bedside commode DME Agency: [employer] Date DME Agency Contacted: 01/12/23 Time DME Agency Contacted: 1427 Representative spoke with at DME Agency: [place] HH Arranged: PT, OT, RN HH Agency: [facility] Date [facility] Agency Contacted: 03/28/23 Time HH Agency Contacted: 1428 Representative spoke with at [facility] Agency: [place]   Social Determinants of Health (SDOH) Interventions SDOH Screenings   Food Insecurity: No Food Insecurity (12/01/2022)  Housing: Patient Declined (12/01/2022)  Transportation Needs: No Transportation Needs (12/01/2022)  Utilities: Not At Risk (12/01/2022)  Depression (PHQ2-9): Low Risk  (06/07/2021)  Financial Resource Strain: High Risk (11/21/2022)   Received from [facility]  Physical Activity: Insufficiently Active (05/03/2017)  Social Connections: Patient Declined (11/20/2022)   Received from [facility]  Stress: Stress Concern Present (11/20/2022)   Received from [facility]  Tobacco Use: Medium Risk (02/13/2023)    Readmission Risk Interventions    03/19/2023    9:58 AM 11/05/2022    2:59 PM 11/05/2022   12:16 PM  Readmission Risk Prevention Plan  Transportation Screening Complete Complete Complete  Medication Review Oceanographer) Complete Complete Complete   PCP or Specialist appointment within 3-5 days of discharge Complete Complete Complete  HRI or Home Care Consult Complete Complete Complete  SW Recovery Care/Counseling Consult Complete Complete Complete  Palliative Care Screening Complete Complete Complete  Skilled Nursing Facility Not Applicable Not Applicable Not Applicable

## 2023-04-16 DIAGNOSIS — I5033 Acute on chronic diastolic (congestive) heart failure: Secondary | ICD-10-CM | POA: Diagnosis not present

## 2023-04-16 LAB — CBC
HCT: 31.4 % — ABNORMAL LOW (ref 39.0–52.0)
Hemoglobin: 8.6 g/dL — ABNORMAL LOW (ref 13.0–17.0)
MCH: 21.4 pg — ABNORMAL LOW (ref 26.0–34.0)
MCHC: 27.4 g/dL — ABNORMAL LOW (ref 30.0–36.0)
MCV: 78.1 fL — ABNORMAL LOW (ref 80.0–100.0)
Platelets: 244 10*3/uL (ref 150–400)
RBC: 4.02 MIL/uL — ABNORMAL LOW (ref 4.22–5.81)
RDW: 19.6 % — ABNORMAL HIGH (ref 11.5–15.5)
WBC: 7.8 10*3/uL (ref 4.0–10.5)
nRBC: 1.8 % — ABNORMAL HIGH (ref 0.0–0.2)

## 2023-04-16 LAB — BASIC METABOLIC PANEL
Anion gap: 9 (ref 5–15)
BUN: 24 mg/dL — ABNORMAL HIGH (ref 8–23)
CO2: 31 mmol/L (ref 22–32)
Calcium: 8.1 mg/dL — ABNORMAL LOW (ref 8.9–10.3)
Chloride: 98 mmol/L (ref 98–111)
Creatinine, Ser: 1.61 mg/dL — ABNORMAL HIGH (ref 0.61–1.24)
GFR, Estimated: 47 mL/min — ABNORMAL LOW (ref >60)
Glucose, Bld: 128 mg/dL — ABNORMAL HIGH (ref 70–99)
Potassium: 3.5 mmol/L (ref 3.5–5.1)
Sodium: 138 mmol/L (ref 135–145)

## 2023-04-16 MED ORDER — POLYETHYLENE GLYCOL 3350 17 G PO PACK
17.0000 g | PACK | Freq: Once | ORAL | Status: AC
  Administered 2023-04-16: 17 g via ORAL
  Filled 2023-04-16: qty 1

## 2023-04-16 NOTE — Progress Notes (Signed)
 PROGRESS NOTE   HPI was taken from Dr. Arnett: Gary [name] is a 67 y.o. 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   [name] states that he ran out of his home torsemide  several days ago due to a mixup with his pharmacy.  Then over the last couple days, he has noticed increasing lower extremity swelling, shortness of breath and chest pain.  He states that he usually develops chest pain when he has fluid on his lungs.  He notes that his girlfriend feels that his face has even become more edematous.  During this time, he noticed some edema in his bilateral hands that was much worse on the left hand with associated numbness and tingling but this has improved.  He denies any nausea, vomiting, abdominal pain.   [name] states that at 1 point, his supplemental oxygen  level was as low as 6, but more recently he wears between 6-8L depending on how he feels.   ED course: On arrival to the ED, patient was hypertensive at 131/93 with heart rate of 103.  He was saturating at 91% on 8 L via trach collar.  He was afebrile at 98.8.  Initial workup notable for pH of 7.3 with pCO2 of 73, hemoglobin of 10.5, potassium 3.4, bicarb 34, glucose 119, BUN 28, creatinine 1.41 with GFR 55.  Troponin negative x 2.  BNP within normal limits.Chest x-ray notable for vascular congestion and cardiomegaly.  CT of the head and C-spine were obtained with no acute abnormalities.  Patient started on Lasix  and [facility] contacted for admission.  As per Dr. Trudy 12/27-12/31/24: Pt has remained medically stable. Pt has not required a transfusion these past 5 days. Pt still wants to be d/c on 04/22/23.    Gary [name]  FMW:[id number] DOB: 1955/09/04 DOA: 11/28/2022 PCP: Delores Corean Pollen, MD   Assessment & Plan:    Principal Problem:   Acute blood loss anemia Active Problems:   Melena   Acute on chronic diastolic CHF (congestive heart failure) ([name])   Acute on chronic respiratory failure with hypoxia and hypercapnia ([name])   COPD (chronic obstructive pulmonary disease) ([name])   Acute kidney injury superimposed on CKD ([name])   OSA (obstructive sleep apnea)   Anxiety   Hypotension   Chest pain   Morbid obesity with BMI of 50.0-59.9, adult ([name])   Generalized weakness   Chronic colitis   Fluid overload   Eyelid cyst, right   Dry skin   Tracheitis  Assessment and Plan: Acute blood loss anemia: possibly secondary to melena. H&H are labile. Will transfuse if Hb < 7.0. Holding eliquis  but can restart tomorrow if H&H remains stable. Cased discussed w/ GI by Dr. Josette who decided to hold off on any scopes at this time as pt had a colonoscopy in May and endoscopy in October. S/p IV iron . Can consider bleeding scan   Difficult to place due to tracheostomy and weight: continue w/ supportive care. See CM's notes.    Acute on chronic diastolic RYQ:pwpupjoob admitted for volume overloaded. Appears euvolemic currently. Continue on aldactone , torsemide     Atypical chest pain: resolved    Acute on chronic hypoxic & hypercapnic respiratory failure: continue on trach collar. S/p abx course for pseudomonas tracheitis. Also evaluated for non-massive hemoptysis with CT soft tissue  of neck done on 02/08/2023.  Tracheostomy tube is in place and there is no definite collection around the tube.  There is thinning of the wall between the esophagus and trachea with possible defect. Stable currently on 2L [place]   COPD: severe. Bronchodilators prn    AKI on CKDIIIa: Cr is labile. Avoid nephrotoxic meds    OSA: CPAP qhs   Right eyelid cyst: lanced by ophthalmology    Chronic colitis: continue w/ supportive care    Generalized weakness: PT/OT continues to work with the pt. Pt has been more motivated and working with  physical therapy and mobility specialist, however, resistant to certain important exercises such as getting up from seated position to standing.   Depression & anxiety: severity unknown. Continue on lexapro     Morbid obesity: BMI 52.1. Complicates overall care & prognosis    Constipation: lactulose  prn        DVT prophylaxis:SCDs Code Status: full  Family Communication:  Disposition Plan: likely d/c back home w/ HH   Level of care: Med-Surg  Status is: Inpatient Remains inpatient appropriate because: severity of illness    Consultants:  Pulmon   Procedures:   Antimicrobials:    Subjective: Pt c/o fatigue   Objective: Vitals:   04/15/23 1940 04/15/23 2317 04/16/23 0459 04/16/23 0544  BP: 111/70  (!) 101/58 114/73  Pulse: 90  91 90  Resp: 18  18   Temp: 98.6 F (37 C)  98.2 F (36.8 C)   TempSrc:      SpO2: 91% 92% 98%   Weight:    (!) 191.2 kg  Height:        Intake/Output Summary (Last 24 hours) at 04/16/2023 9177 Last data filed at 04/16/2023 0100 Gross per 24 hour  Intake 480 ml  Output 1100 ml  Net -620 ml   Filed Weights   04/13/23 0443 04/14/23 0523 04/16/23 0544  Weight: (!) 193.6 kg (!) 191.2 kg (!) 191.2 kg    Examination:  General exam: appears comfortable. Morbidly obese Respiratory system: diminished breath sounds b/l  Cardiovascular system: S1/S2+. No rubs or clicks  Gastrointestinal system: abd is soft, NT, obese, normal bowel sounds  Central nervous system: alert & oriented.moves all extremities   Psychiatry: judgement and insight appears at baseline. Flat mood and affect     Data Reviewed: I have personally reviewed following labs and imaging studies  CBC: Recent Labs  Lab 04/10/23 0525 04/11/23 0929 04/12/23 0605 04/13/23 0844 04/14/23 0600 04/16/23 0441  WBC 7.9  --  7.6 8.5 7.9 7.8  HGB 8.6* 8.1* 8.2* 8.3* 8.6* 8.6*  HCT 31.4*  --  30.7* 30.7* 31.5* 31.4*  MCV 76.8*  --  79.3* 78.7* 78.8* 78.1*  PLT 230  --  236  251 246 244   Basic Metabolic Panel: Recent Labs  Lab 04/11/23 0929 04/12/23 0605 04/13/23 0844 04/14/23 0600 04/16/23 0441  NA 137 139 142 139 138  K 3.4* 3.5 3.5 3.7 3.5  CL 100 100 102 99 98  CO2 30 31 31  32 31  GLUCOSE 140* 119* 120* 99 128*  BUN 27* 23 24* 25* 24*  CREATININE 1.74* 1.60* 1.48* 1.53* 1.61*  CALCIUM  7.6* 8.0* 8.3* 8.2* 8.1*   GFR: Estimated Creatinine Clearance: 83.2 mL/min (A) (by C-G formula based on SCr of 1.61 mg/dL (H)). Liver Function Tests: No results for input(s): AST, ALT, ALKPHOS, BILITOT, PROT, ALBUMIN  in the last 168 hours. No results for input(s): LIPASE, AMYLASE in the last 168 hours.  No results for input(s): AMMONIA in the last 168 hours. Coagulation Profile: No results for input(s): INR, PROTIME in the last 168 hours. Cardiac Enzymes: No results for input(s): CKTOTAL, CKMB, CKMBINDEX, TROPONINI in the last 168 hours. BNP (last 3 results) No results for input(s): PROBNP in the last 8760 hours. HbA1C: No results for input(s): HGBA1C in the last 72 hours. CBG: No results for input(s): GLUCAP in the last 168 hours. Lipid Profile: No results for input(s): CHOL, HDL, LDLCALC, TRIG, CHOLHDL, LDLDIRECT in the last 72 hours. Thyroid Function Tests: No results for input(s): TSH, T4TOTAL, FREET4, T3FREE, THYROIDAB in the last 72 hours. Anemia Panel: No results for input(s): VITAMINB12, FOLATE, FERRITIN, TIBC, IRON , RETICCTPCT in the last 72 hours. Sepsis Labs: No results for input(s): PROCALCITON, LATICACIDVEN in the last 168 hours.  No results found for this or any previous visit (from the past 240 hours).       Radiology Studies: No results found.      Scheduled Meds:  amphetamine -dextroamphetamine   30 mg Oral Q breakfast   bacitracin    Topical Daily   clotrimazole    Topical BID   escitalopram   10 mg Oral Daily   Gerhardt's butt cream   Topical Daily    guaiFENesin   1,200 mg Oral BID   Ipratropium-Albuterol   1 puff Inhalation Q6H   Mesalamine   800 mg Oral BID   pantoprazole   40 mg Oral BID   spironolactone   25 mg Oral Daily   torsemide   40 mg Oral BID   Continuous Infusions:   LOS: 137 days       Anthony CHRISTELLA Pouch, MD [facility] Pager [phone]  If 7PM-7AM, please contact night-coverage [email or web address] 04/16/2023, 8:22 AM
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
## 2023-04-16 NOTE — Plan of Care (Signed)

## 2023-04-16 NOTE — Progress Notes (Signed)
 PT Cancellation Note  Patient Details Name: Gary [name] MRN: [id number] DOB: July 23, 1955   Cancelled Treatment:    Reason Eval/Treat Not Completed: Patient declined, no reason specified. Pt refusing stating that he was ready this morning, but not now. Patient was informed that PT caseload is very full and we can't commit to a specific schedule at this time. He states that he will be willing to do PT tomorrow. He was informed that tomorrow is a holiday and that if he were to refuse today it may limit his progression with therapy for more than just today. The patient states that he understands and that he is too dizzy to participate at this time. He was encourage that movement might assist with his blood flow to combat dizziness. Patient continues to refuse.    Anthony Roland A Macayla Ekdahl 04/16/2023, 2:07 PM

## 2023-04-17 DIAGNOSIS — J9621 Acute and chronic respiratory failure with hypoxia: Secondary | ICD-10-CM | POA: Diagnosis not present

## 2023-04-17 DIAGNOSIS — D62 Acute posthemorrhagic anemia: Secondary | ICD-10-CM | POA: Diagnosis not present

## 2023-04-17 DIAGNOSIS — K921 Melena: Secondary | ICD-10-CM | POA: Diagnosis not present

## 2023-04-17 DIAGNOSIS — I5033 Acute on chronic diastolic (congestive) heart failure: Secondary | ICD-10-CM | POA: Diagnosis not present

## 2023-04-17 LAB — CBC
HCT: 32.3 % — ABNORMAL LOW (ref 39.0–52.0)
Hemoglobin: 8.7 g/dL — ABNORMAL LOW (ref 13.0–17.0)
MCH: 21.3 pg — ABNORMAL LOW (ref 26.0–34.0)
MCHC: 26.9 g/dL — ABNORMAL LOW (ref 30.0–36.0)
MCV: 79.2 fL — ABNORMAL LOW (ref 80.0–100.0)
Platelets: 262 10*3/uL (ref 150–400)
RBC: 4.08 MIL/uL — ABNORMAL LOW (ref 4.22–5.81)
RDW: 19.5 % — ABNORMAL HIGH (ref 11.5–15.5)
WBC: 7.9 10*3/uL (ref 4.0–10.5)
nRBC: 1.4 % — ABNORMAL HIGH (ref 0.0–0.2)

## 2023-04-17 LAB — BASIC METABOLIC PANEL
Anion gap: 10 (ref 5–15)
BUN: 21 mg/dL (ref 8–23)
CO2: 31 mmol/L (ref 22–32)
Calcium: 8.1 mg/dL — ABNORMAL LOW (ref 8.9–10.3)
Chloride: 94 mmol/L — ABNORMAL LOW (ref 98–111)
Creatinine, Ser: 1.62 mg/dL — ABNORMAL HIGH (ref 0.61–1.24)
GFR, Estimated: 47 mL/min — ABNORMAL LOW (ref >60)
Glucose, Bld: 129 mg/dL — ABNORMAL HIGH (ref 70–99)
Potassium: 3.3 mmol/L — ABNORMAL LOW (ref 3.5–5.1)
Sodium: 135 mmol/L (ref 135–145)

## 2023-04-17 NOTE — Progress Notes (Signed)
 Progress Note   Patient: Gary [name] FMW:[id number] DOB: 1955/12/17 DOA: 11/28/2022     138 DOS: the patient was seen and examined on 04/17/2023   Brief hospital course: 68 y.o. 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  Patient initially ran out of torsemide  and was found to be in CHF exacerbation.  Eventually became euvolemic and transition to p.o. medications.  Patient has been on trach collar status post course of antibiotics for Pseudomonas tracheitis.  Overall stable on 2 L nasal cannula.  Currently awaiting safe disposition. Difficult to place due to tracheostomy  12/23.  Eliquis  held secondary to black stools for the last couple days.  Empirically placed on Protonix . 12/25.  Hemoglobin drifted down to 8.6.  No further bowel movement since yesterday.  Case discussed with gastroenterology since bleeding is stopped we will hold off on procedures.  If rebleeds can consider a bleeding scan. 12/26.  Hemoglobin down to 8.1.  Patient agreeable for an IV for IV iron .  Still no bowel movement since the 24th.  As per Dr. Trudy 12/27-12/31/24: Pt has remained medically stable. Pt has not required a transfusion these past 5 days. Pt still wants to be d/c on 04/22/23.   04/17/2023.  Hemoglobin 8.7, creatinine 1.62   Assessment and Plan: * Acute blood loss anemia Hemoglobin went from 10.1 down to 8.1.  Last hemoglobin 8.7.  Holding Eliquis .  On oral Protonix .  Received IV iron  during hospital course.  Melena Holding Eliquis .  On oral Protonix .  No bleeding since 12/24.   Acute on chronic respiratory failure with hypoxia and hypercapnia ([name]) Currently on on 2 L.  Continue ventilator at night.  Patient went through prolonged weaning secondary to severe COPD with hypercapnic hypoxic respiratory  failure and sleep apnea.  Earlier in the hospital stay was on much more oxygen .  Acute on chronic diastolic CHF (congestive heart failure) ([name]) Continue torsemide  twice a day.  COPD (chronic obstructive pulmonary disease) ([name]) Severe COPD on Combivent , as needed albuterol .   Acute kidney injury superimposed on CKD ([name]) Acute kidney injury on CKD stage IIIa.  Creatinine 1.62 with diuresis.  (On 9/18 did have a creatinine of 1.17).  Creatinine will likely stay higher being on torsemide  40 mg twice a day.   Anxiety On Atarax  as needed.  Continue Lexapro   OSA (obstructive sleep apnea) Continue home ventilator at night  Chest pain ACS ruled out.  Hypotension Off midodrine   Dry skin Lac-Hydrin  and antifungal cream to feet.  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  Chronic colitis Continue home mesalamine   Generalized weakness Continue working with physical therapy and mobility specialist to work on getting out of the bed and ambulating.  Continue working on standing from commode and chair.  Continue also doing exercises in the bed.  Continue to enforce building up strength for discharge on 04/22/23.  Morbid obesity with BMI of 50.0-59.9, adult ([name]) BMI 51.58 with last height and weight in computer.         Subjective: Patient thinks he has a little swelling of his lower extremities.  His feet look better.  No blood in the bowel movements.  Patient admitted 139 days ago with shortness of breath.  Physical Exam: Vitals:   04/16/23 2127 04/17/23 0444 04/17/23 0445 04/17/23 0755  BP: 103/77  115/74 101/64  Pulse: 86  82 83  Resp: 18  18 16   Temp: 98.1 F (36.7 C)  98 F (36.7 C)   TempSrc: Oral  Oral   SpO2: 96%  95% 95%  Weight:  (!) 192.2 kg    Height:       Physical Exam HENT:     Head: Normocephalic.  Eyes:     General: Lids are normal.     Conjunctiva/sclera: Conjunctivae normal.  Cardiovascular:     Rate and Rhythm: Normal rate and regular rhythm.      Heart sounds: Normal heart sounds, S1 normal and S2 normal.  Pulmonary:     Breath sounds: Examination of the right-lower field reveals decreased breath sounds. Examination of the left-lower field reveals decreased breath sounds. Decreased breath sounds present. No wheezing or rhonchi.  Abdominal:     Palpations: Abdomen is soft.     Tenderness: There is no abdominal tenderness.  Musculoskeletal:     Right lower leg: Swelling present.     Left lower leg: Swelling present.  Skin:    General: Skin is warm.     Findings: No rash.  Neurological:     Mental Status: He is alert and oriented to person, place, and time.     Data Reviewed: Creatinine 1.62, potassium 3.3, hemoglobin 8.7   Disposition: Status is: Inpatient Remains inpatient appropriate because: Plan for discharge on 04/22/2023  Planned Discharge Destination: Home with Home Health    Time spent: 27 minutes  Author: Charlie Patterson, MD 04/17/2023 2:56 PM  For on call review [email or web address].

## 2023-04-17 NOTE — Plan of Care (Signed)
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
## 2023-04-18 DIAGNOSIS — J9621 Acute and chronic respiratory failure with hypoxia: Secondary | ICD-10-CM | POA: Diagnosis not present

## 2023-04-18 DIAGNOSIS — D62 Acute posthemorrhagic anemia: Secondary | ICD-10-CM | POA: Diagnosis not present

## 2023-04-18 DIAGNOSIS — K921 Melena: Secondary | ICD-10-CM | POA: Diagnosis not present

## 2023-04-18 DIAGNOSIS — I5033 Acute on chronic diastolic (congestive) heart failure: Secondary | ICD-10-CM | POA: Diagnosis not present

## 2023-04-18 NOTE — Progress Notes (Signed)
 Occupational Therapy Treatment Patient Details Name: Gary [name] MRN: [id number] DOB: 1955/05/06 Today's Date: 04/18/2023   History of present illness 68 y/o male presented to [facility] ED on 11/28/22 for chest pain x 4 days. Admitted for acute on chronic CHF. Frequent admissions this year with most recent dsicharge on 11/21/22. PMH includes obesity, hypoxia, respiratory failure with tracheostomy, COPD, GIB, HFpEF.   OT comments  Gary [name] was seen for OT treatment on this date. Upon arrival to room pt in bed, agreeable to tx. Pt requires SUPERVISION + RW for ADL t/f ~100 ft and functional reaching task with single UE support. Pt making good progress toward goals, will continue to follow POC. Discharge recommendation remains appropriate.       If plan is discharge home, recommend the following:  A little help with walking and/or transfers;A lot of help with bathing/dressing/bathroom   Equipment Recommendations  BSC/3in1    Recommendations for Other Services      Precautions / Restrictions Precautions Precautions: Fall Restrictions Weight Bearing Restrictions Per Provider Order: No       Mobility Bed Mobility Overal bed mobility: Modified Independent                  Transfers Overall transfer level: Needs assistance Equipment used: Rolling walker (2 wheels) Transfers: Sit to/from Stand Sit to Stand: From elevated surface, Supervision                 Balance Overall balance assessment: Needs assistance Sitting-balance support: No upper extremity supported Sitting balance-[name] Scale: Normal     Standing balance support: Single extremity supported, During functional activity Standing balance-[name] Scale: Fair                             ADL either performed or assessed with clinical judgement   ADL Overall ADL's : Needs assistance/impaired                                       General ADL Comments: SUPERVISION + RW for ADL t/f  and functional reaching task      Cognition Arousal: Alert Behavior During Therapy: WFL for tasks assessed/performed Overall Cognitive Status: Within Functional Limits for tasks assessed                                                     Pertinent Vitals/ Pain       Pain Assessment Pain Assessment: No/denies pain   Frequency  Min 1X/week        Progress Toward Goals  OT Goals(current goals can now be found in the care plan section)  Progress towards OT goals: Progressing toward goals  ADL Goals Pt Will Perform Grooming: with modified independence;standing Pt Will Transfer to Toilet: with modified independence;ambulating;regular height toilet Pt Will Perform Toileting - Clothing Manipulation and hygiene: with mod assist;sit to/from stand Additional ADL Goal #1: Pt will stand from the hospital bed or recliner adn LR DME with min A as a precursor to ADLs  Plan Discharge plan remains appropriate;Frequency remains appropriate    Co-evaluation                 AM-PAC OT 6 Clicks Daily Activity  Outcome Measure   Help from another person eating meals?: None Help from another person taking care of personal grooming?: A Little Help from another person toileting, which includes using toliet, bedpan, or urinal?: A Lot Help from another person bathing (including washing, rinsing, drying)?: A Lot Help from another person to put on and taking off regular upper body clothing?: None Help from another person to put on and taking off regular lower body clothing?: A Lot 6 Click Score: 17    End of Session Equipment Utilized During Treatment: Rolling walker (2 wheels);Oxygen   OT Visit Diagnosis: Unsteadiness on feet (R26.81);Other abnormalities of gait and mobility (R26.89);Muscle weakness (generalized) (M62.81);Adult, failure to thrive (R62.7)   Activity Tolerance Patient tolerated treatment well   Patient Left in chair;with call bell/phone within  reach   Nurse Communication          Time: 8675-8651 OT Time Calculation (min): 24 min  Charges: OT General Charges $OT Visit: 1 Visit OT Treatments $Self Care/Home Management : 8-22 mins $Therapeutic Activity: 8-22 mins  Elston Slot, M.S. OTR/L  04/18/23, 2:05 PM  ascom [phone]

## 2023-04-18 NOTE — Progress Notes (Signed)
 Progress Note   Patient: Gary [name] FMW:[id number] DOB: October 16, 1955 DOA: 11/28/2022     139 DOS: the patient was seen and examined on 04/18/2023   Brief hospital course: 68 y.o. 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  Patient initially ran out of torsemide  and was found to be in CHF exacerbation.  Eventually became euvolemic and transition to p.o. medications.  Patient has been on trach collar status post course of antibiotics for Pseudomonas tracheitis.  Overall stable on 2 L nasal cannula.  Currently awaiting safe disposition. Difficult to place due to tracheostomy  12/23.  Eliquis  held secondary to black stools for the last couple days.  Empirically placed on Protonix . 12/25.  Hemoglobin drifted down to 8.6.  No further bowel movement since yesterday.  Case discussed with gastroenterology since bleeding is stopped we will hold off on procedures.  If rebleeds can consider a bleeding scan. 12/26.  Hemoglobin down to 8.1.  Patient agreeable for an IV for IV iron .  Still no bowel movement since the 24th.  As per Dr. Trudy 12/27-12/31/24: Pt has remained medically stable. Pt has not required a transfusion these past 5 days. Pt still wants to be d/c on 04/22/23.   04/17/2023.  Hemoglobin 8.7, creatinine 1.62  Assessment and Plan: * Acute blood loss anemia Hemoglobin went from 10.1 down to 8.1.  Last hemoglobin 8.7.  Holding Eliquis .  On oral Protonix .  Received IV iron  during hospital course.  Melena Holding Eliquis .  On oral Protonix .  No bleeding since 12/24.   Acute on chronic respiratory failure with hypoxia and hypercapnia ([name]) Currently on on 2 L.  Continue ventilator at night.  Patient went through prolonged weaning secondary to severe COPD with hypercapnic hypoxic respiratory  failure and sleep apnea.  Earlier in the hospital stay was on much more oxygen .  Acute on chronic diastolic CHF (congestive heart failure) ([name]) Continue torsemide  twice a day.  COPD (chronic obstructive pulmonary disease) ([name]) Severe COPD on Combivent , as needed albuterol .   Acute kidney injury superimposed on CKD ([name]) Acute kidney injury on CKD stage IIIa.  Creatinine 1.62 with diuresis.  (On 9/18 did have a creatinine of 1.17).  Creatinine will likely stay higher being on torsemide  40 mg twice a day.   Anxiety On Atarax  as needed.  Continue Lexapro   OSA (obstructive sleep apnea) Continue home ventilator at night  Chest pain ACS ruled out.  Hypotension Off midodrine   Dry skin Lac-Hydrin  and antifungal cream to feet.  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  Chronic colitis Continue home mesalamine   Generalized weakness Continue working with physical therapy and mobility specialist to work on getting out of the bed and ambulating.  Continue working on standing from commode and chair.  Continue also doing exercises in the bed.  Continue to enforce building up strength for discharge on 04/22/23.  Morbid obesity with BMI of 50.0-59.9, adult ([name]) BMI 51.36 with last height and weight in computer.         Subjective: Patient feeling okay.  No further bleeding.  Asking for suppositories to go home with when he goes home so he can have a bowel movement.  Admitted 141 days ago with shortness of breath.  Physical Exam: Vitals:   04/18/23 0500 04/18/23 0605 04/18/23 0817 04/18/23 1550  BP:  115/85 113/69 119/79  Pulse:  82 82 84  Resp:  18 18 16   Temp:  97.7 F (36.5 C)  97.9 F (36.6 C)  TempSrc:      SpO2:  92% 95% 92%  Weight: (!) 191.4 kg     Height:       Physical Exam HENT:     Head: Normocephalic.  Eyes:     General: Lids are normal.     Conjunctiva/sclera: Conjunctivae normal.  Cardiovascular:     Rate and Rhythm: Normal rate and regular rhythm.      Heart sounds: Normal heart sounds, S1 normal and S2 normal.  Pulmonary:     Breath sounds: Examination of the right-lower field reveals decreased breath sounds. Examination of the left-lower field reveals decreased breath sounds. Decreased breath sounds present. No wheezing or rhonchi.  Abdominal:     Palpations: Abdomen is soft.     Tenderness: There is no abdominal tenderness.  Musculoskeletal:     Right lower leg: Swelling present.     Left lower leg: Swelling present.  Skin:    General: Skin is warm.     Findings: No rash.  Neurological:     Mental Status: He is alert and oriented to person, place, and time.     Data Reviewed: No new data today  Disposition: Status is: Inpatient Remains inpatient appropriate because: Patient plans on leaving on 04/22/2023  Planned Discharge Destination: Home health    Time spent: 27 minutes  Author: Charlie Patterson, MD 04/18/2023 5:56 PM  For on call review [email or web address].

## 2023-04-18 NOTE — Progress Notes (Signed)
 Physical Therapy Treatment Patient Details Name: Gary [name] MRN: [id number] DOB: Apr 25, 1955 Today's Date: 04/18/2023   History of Present Illness 68 y/o male presented to [facility] ED on 11/28/22 for chest pain x 4 days. Admitted for acute on chronic CHF. Frequent admissions this year with most recent dsicharge on 11/21/22. PMH includes obesity, hypoxia, respiratory failure with tracheostomy, COPD, GIB, HFpEF.    PT Comments  Pt was long sitting in bed upon arrival. He is A and O x 4 and endorses still planning to DC home Monday 04/22/23. Discussed equipment recs,  I need to call Francis , my equipment rep for home stuff. Pt was agreeable to session with a little encouragement. He was able to exit bed with use of bed rails + vcs for improved technique. Stood from elevated bed height to BRW prior to ambulating ~ 60 ft. Distance limited by pt fatigue and L knee pain. Pt st in Bariatric recliner prior to standing from recliner and ambulating ~ 8 ft to EOB. Pt requires more assistance to reposition in bed than to exit. Discussed at length upcoming DC. Pt is eager and states confidence he will be able to safely manage with assistance available at DC.    If plan is discharge home, recommend the following: A lot of help with bathing/dressing/bathroom;Assist for transportation;A lot of help with walking and/or transfers;Direct supervision/assist for medications management;Direct supervision/assist for financial management;Help with stairs or ramp for entrance;Supervision due to cognitive status     Equipment Recommendations  Rolling walker (2 wheels);BSC/3in1;Wheelchair (measurements PT);Wheelchair cushion (measurements PT);Hospital bed;Hoyer lift (bariatric- pt endorses having equipment but will need to be confirmed to maximize pt success at DC)       Precautions / Restrictions Precautions Precautions: Fall Precaution Comments: capped trach/ 2l O2 [place] increased for 4 L during OOB activity Restrictions Weight  Bearing Restrictions Per Provider Order: No     Mobility  Bed Mobility Overal bed mobility: Modified Independent Bed Mobility: Supine to Sit, Sit to Supine Rolling: Used rails Supine to sit: Used rails, Supervision Sit to supine: Min assist, Used rails   Transfers Overall transfer level: Needs assistance Equipment used: Rolling walker (2 wheels) (bariatric) Transfers: Sit to/from Stand Sit to Stand: From elevated surface, Supervision, Mod assist, +2 safety/equipment  General transfer comment: supervision to stand from elevat5ed surfaces. mod A of one with 2nd person for safety to stand form lower recliner surface. no pillows under pt in recliner. vcs for fwd wt shift and technique improvements    Ambulation/Gait Ambulation/Gait assistance: Supervision Gait Distance (Feet): 60 Feet Assistive device: Rolling walker (2 wheels) Gait Pattern/deviations: WFL(Within Functional Limits), Step-through pattern Gait velocity: decreased  General Gait Details: pt ambulated on 4 L o2 [place]. Tolerated gait ~ 60 ft prior to requesting seated rest. did static stand for several minutes prior to sitting in recliner. Pushed from hallway to room in recliner prior to pt standing from recliner and ambulating to EOB   Balance Overall balance assessment: Needs assistance Sitting-balance support: No upper extremity supported Sitting balance-[name] Scale: Normal     Standing balance support: Bilateral upper extremity supported, Reliant on assistive device for balance, During functional activity Standing balance-[name] Scale: Fair Standing balance comment: Reliant on BRW for all standing activity, min-no unsteadiness with dynamic activity though increased sway       Cognition Arousal: Alert Behavior During Therapy: WFL for tasks assessed/performed Overall Cognitive Status: Within Functional Limits for tasks assessed   General Comments: Pt is A and O x4.  Agreeable to session and continues to understand that  MD is planning to DC pt home  monday           General Comments General comments (skin integrity, edema, etc.): discussed quipment needs for upcoming DC home. Pt states he will reach out to his home equipment rep      Pertinent Vitals/Pain Pain Assessment Pain Assessment: 0-10 Pain Score: 2  Pain Location: L knee Pain Descriptors / Indicators: Discomfort Pain Intervention(s): Limited activity within patient's tolerance, Monitored during session, Premedicated before session     PT Goals (current goals can now be found in the care plan section) Acute Rehab PT Goals Patient Stated Goal: go home monday Progress towards PT goals: Progressing toward goals    Frequency    Min 1X/week      PT Plan Current plan remains appropriate    Co-evaluation     PT goals addressed during session: Mobility/safety with mobility;Balance;Proper use of DME;Strengthening/ROM        AM-PAC PT 6 Clicks Mobility   Outcome Measure  Help needed turning from your back to your side while in a flat bed without using bedrails?: None Help needed moving from lying on your back to sitting on the side of a flat bed without using bedrails?: A Little Help needed moving to and from a bed to a chair (including a wheelchair)?: A Little Help needed standing up from a chair using your arms (e.g., wheelchair or bedside chair)?: A Little Help needed to walk in hospital room?: A Lot Help needed climbing 3-5 steps with a railing? : A Lot 6 Click Score: 17    End of Session Equipment Utilized During Treatment: Oxygen  Activity Tolerance: Patient tolerated treatment well Patient left: in bed;with call bell/phone within reach Nurse Communication: Mobility status PT Visit Diagnosis: Muscle weakness (generalized) (M62.81);Difficulty in walking, not elsewhere classified (R26.2)     Time: 1020-1040 PT Time Calculation (min) (ACUTE ONLY): 20 min  Charges:    $Therapeutic Activity: 8-22 mins PT General  Charges $$ ACUTE PT VISIT: 1 Visit                     Rankin Essex PTA 04/18/23, 2:10 PM

## 2023-04-18 NOTE — Progress Notes (Signed)
 Mobility Specialist - Progress Note   04/18/23 1537  Mobility  Activity Transferred from chair to bed  Level of Assistance Maximum assist, patient does 25-49%  Assistive Device MaxiSky / Overhead Lift  Activity Response Tolerated well  Mobility visit 1 Mobility  Mobility Specialist Start Time (ACUTE ONLY) 1505  Mobility Specialist Stop Time (ACUTE ONLY) 1517  Mobility Specialist Time Calculation (min) (ACUTE ONLY) 12 min   Pt sitting in the recliner upon entry, utilizing 2L. Pt initially agreeable to stand from recliner and transferred to bed, however upon attempt to stand from the recliner Pt expressed I just don't think I can do it, I aint got no power to get up referring to weakness in BUE and BLE. Pt transferred to bed via overhead lift +2 for safety. Pt left supine with alarm set and needs within reach.  Gary [name] Mobility Specialist 04/18/23 3:41 PM
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
## 2023-04-19 DIAGNOSIS — K921 Melena: Secondary | ICD-10-CM | POA: Diagnosis not present

## 2023-04-19 DIAGNOSIS — I5033 Acute on chronic diastolic (congestive) heart failure: Secondary | ICD-10-CM | POA: Diagnosis not present

## 2023-04-19 DIAGNOSIS — D62 Acute posthemorrhagic anemia: Secondary | ICD-10-CM | POA: Diagnosis not present

## 2023-04-19 DIAGNOSIS — J9621 Acute and chronic respiratory failure with hypoxia: Secondary | ICD-10-CM | POA: Diagnosis not present

## 2023-04-19 LAB — CBC
HCT: 32.4 % — ABNORMAL LOW (ref 39.0–52.0)
Hemoglobin: 8.6 g/dL — ABNORMAL LOW (ref 13.0–17.0)
MCH: 21.3 pg — ABNORMAL LOW (ref 26.0–34.0)
MCHC: 26.5 g/dL — ABNORMAL LOW (ref 30.0–36.0)
MCV: 80.2 fL (ref 80.0–100.0)
Platelets: 255 10*3/uL (ref 150–400)
RBC: 4.04 MIL/uL — ABNORMAL LOW (ref 4.22–5.81)
RDW: 19.1 % — ABNORMAL HIGH (ref 11.5–15.5)
WBC: 9.7 10*3/uL (ref 4.0–10.5)
nRBC: 0.7 % — ABNORMAL HIGH (ref 0.0–0.2)

## 2023-04-19 LAB — BASIC METABOLIC PANEL
Anion gap: 16 — ABNORMAL HIGH (ref 5–15)
BUN: 27 mg/dL — ABNORMAL HIGH (ref 8–23)
CO2: 28 mmol/L (ref 22–32)
Calcium: 8.2 mg/dL — ABNORMAL LOW (ref 8.9–10.3)
Chloride: 95 mmol/L — ABNORMAL LOW (ref 98–111)
Creatinine, Ser: 1.59 mg/dL — ABNORMAL HIGH (ref 0.61–1.24)
GFR, Estimated: 48 mL/min — ABNORMAL LOW (ref >60)
Glucose, Bld: 160 mg/dL — ABNORMAL HIGH (ref 70–99)
Potassium: 3.8 mmol/L (ref 3.5–5.1)
Sodium: 139 mmol/L (ref 135–145)

## 2023-04-19 MED ORDER — LACTULOSE 10 GM/15ML PO SOLN
20.0000 g | Freq: Two times a day (BID) | ORAL | Status: DC
  Filled 2023-04-19 (×7): qty 30

## 2023-04-19 NOTE — Progress Notes (Signed)
 Occupational Therapy Treatment Patient Details Name: Gary [name] MRN: [id number] DOB: 07/06/1955 Today's Date: 04/19/2023   History of present illness 68 y/o male presented to [facility] ED on 11/28/22 for chest pain x 4 days. Admitted for acute on chronic CHF. Frequent admissions this year with most recent dsicharge on 11/21/22. PMH includes obesity, hypoxia, respiratory failure with tracheostomy, COPD, GIB, HFpEF.   OT comments  Gary [name] was seen for OT treatment on this date. Upon arrival to room pt in bed, eager and agreeable to shower. Pt requires SUPERVISION + RW for bed> walk in shower t/f and seated UB bathing (assist for back only). Educated on techniques for thorough BLE bathing. (Pt with tub shower at home, will plan to sponge bathe). MIN A to stand from elevated BSC height ~23 using grab bars. Attempted pericare in standing however pt reports fatigue and elects to return to bed MOD I using rails. Pt making good progress toward goals, will continue to follow POC. Discharge recommendation remains appropriate.       If plan is discharge home, recommend the following:  A little help with walking and/or transfers;A lot of help with bathing/dressing/bathroom   Equipment Recommendations  BSC/3in1    Recommendations for Other Services      Precautions / Restrictions Precautions Precautions: Fall Precaution Comments: capped trach/ 2l O2 Gary [name] increased for 4 L during OOB activity Restrictions Weight Bearing Restrictions Per Provider Order: No RLE Weight Bearing Per Provider Order: Weight bearing as tolerated       Mobility Bed Mobility Overal bed mobility: Modified Independent                  Transfers Overall transfer level: Needs assistance Equipment used: Rolling walker (2 wheels) Transfers: Sit to/from Stand Sit to Stand: Min assist           General transfer comment: MIN A to rise from elevated BSC height ~23     Balance Overall balance assessment: Needs  assistance Sitting-balance support: No upper extremity supported Sitting balance-[name] Scale: Normal Sitting balance - Comments: unsupported sitting EOB   Standing balance support: Single extremity supported, During functional activity Standing balance-[name] Scale: Fair                             ADL either performed or assessed with clinical judgement   ADL Overall ADL's : Needs assistance/impaired                                       General ADL Comments: SUPERVISION + RW for shower t/f and seated UB bathing.      Cognition Arousal: Alert Behavior During Therapy: WFL for tasks assessed/performed Overall Cognitive Status: Within Functional Limits for tasks assessed                                                     Pertinent Vitals/ Pain       Pain Assessment Pain Assessment: No/denies pain   Frequency  Min 1X/week        Progress Toward Goals  OT Goals(current goals can now be found in the care plan section)  Progress towards OT goals: Progressing toward goals  Plan Discharge plan remains appropriate;Frequency remains appropriate       AM-PAC OT 6 Clicks Daily Activity     Outcome Measure   Help from another person eating meals?: None Help from another person taking care of personal grooming?: A Little Help from another person toileting, which includes using toliet, bedpan, or urinal?: A Lot Help from another person bathing (including washing, rinsing, drying)?: A Lot Help from another person to put on and taking off regular upper body clothing?: None Help from another person to put on and taking off regular lower body clothing?: A Lot 6 Click Score: 17    End of Session    OT Visit Diagnosis: Unsteadiness on feet (R26.81);Other abnormalities of gait and mobility (R26.89);Muscle weakness (generalized) (M62.81);Adult, failure to thrive (R62.7)   Activity Tolerance Patient tolerated treatment  well   Patient Left in bed;with call bell/phone within reach;with nursing/sitter in room   Nurse Communication          Time: 8687-8595 OT Time Calculation (min): 52 min  Charges: OT General Charges $OT Visit: 1 Visit OT Treatments $Self Care/Home Management : 38-52 mins  Elston Slot, M.S. OTR/L  04/19/23, 2:40 PM  ascom [phone]

## 2023-04-19 NOTE — Plan of Care (Signed)

## 2023-04-19 NOTE — Progress Notes (Signed)
   04/19/23 1100  Spiritual Encounters  Type of Visit Follow up  Care provided to: Patient  Referral source Chaplain assessment  Reason for visit Routine spiritual support  OnCall Visit No  Spiritual Framework  Presenting Themes Meaning/purpose/sources of inspiration;Courage hope and growth  Patient Stress Factors Lack of caregivers;Major life changes  Interventions  Spiritual Care Interventions Made Established relationship of care and support;Compassionate presence;Reflective listening;Normalization of emotions  Intervention Outcomes  Outcomes Connection to spiritual care;Awareness of support  Spiritual Care Plan  Spiritual Care Issues Still Outstanding No further spiritual care needs at this time (see row info)   Spoke with the patient since he has been here for over 4 months. Patient said he is doing better but have some concerns of healthcare once he leaves the hospital. Patient has a girlfriend at home but concern that she not able to do all he needs done. Encourage patient that we are here for him if he needs to talk.
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
## 2023-04-19 NOTE — Progress Notes (Signed)
 Physical Therapy Treatment Patient Details Name: Gary [name] MRN: [id number] DOB: [birth year] Today's Date: 04/19/2023   History of Present Illness 68 y/o male presented to [facility] ED on 11/28/22 for chest pain x 4 days. Admitted for acute on chronic CHF. Frequent admissions this year with most recent dsicharge on 11/21/22. PMH includes obesity, hypoxia, respiratory failure with tracheostomy, COPD, GIB, HFpEF.    PT Comments  Pt was long sitting in bed upon arrival. On 2 L o2 throughout session. Sao2 86% upon sitting up. Increased to 4 L o2 throughout session. Easily able to exit bed. Session mostly focused on tranfers. He stood 4 x total from slightly lowered bed height. Author demonstrated and encouraged pt to attempt step training to simulate home entry. Pt unwilling due to fear. Chartered Loss Adjuster also educated pt on proper performance of steps with using manual w/c + caregiver. Pt states several times, Ill be able to do it, I did it last time I went home and I'm stronger now. Discussed home equipment needs. Pt states home equipment rep is coming today to exchange bi-pap for Cpap. MD aware of pt's abilities and pt's plan remains for Dc home 04/21/22.    If plan is discharge home, recommend the following: A little help with walking and/or transfers;A little help with bathing/dressing/bathroom;Assistance with cooking/housework;Direct supervision/assist for medications management;Direct supervision/assist for financial management;Assist for transportation;Help with stairs or ramp for entrance;Supervision due to cognitive status     Equipment Recommendations  Rolling walker (2 wheels);BSC/3in1;Wheelchair (measurements PT);Wheelchair cushion (measurements PT);Hospital bed;Hoyer lift       Precautions / Restrictions Precautions Precautions: Fall Precaution Comments: capped trach/ 2l O2 [place] increased for 4 L during OOB activity Restrictions Weight Bearing Restrictions Per Provider Order: No RLE Weight Bearing Per  Provider Order: Weight bearing as tolerated     Mobility  Bed Mobility Overal bed mobility: Modified Independent Bed Mobility: Supine to Sit, Sit to Supine Rolling: Used rails Supine to sit: Used rails, Supervision Sit to supine: Supervision   Transfers Overall transfer level: Needs assistance Equipment used: Rolling walker (2 wheels) Transfers: Sit to/from Stand Sit to Stand: From elevated surface, Supervision  General transfer comment: pt was increased from 2L O2 [place] to 4 L. Sao2 upon sitting up 86% but improved with time and breathing techniques    Ambulation/Gait  General Gait Details: Session focused on improving transfer abilities. Encouraged pt to attempt a step to simulate home entry but pt refused. Author demonstrated. Pt encouraged to use w/c for stairs at DC until he practices stairs with [facility] therapist.     Balance Overall balance assessment: Needs assistance Sitting-balance support: No upper extremity supported Sitting balance-[name] Scale: Normal     Standing balance support: Bilateral upper extremity supported, During functional activity, Reliant on assistive device for balance Standing balance-[name] Scale: Fair Standing balance comment: Reliant on BRW for all standing activity, min-no unsteadiness with dynamic activity though increased sway      Cognition Arousal: Alert Behavior During Therapy: WFL for tasks assessed/performed Overall Cognitive Status: Within Functional Limits for tasks assessed    General Comments: Pt is A and O x4. Agreeable to session and continues to understand that MD is planning to DC pt home  monday               Pertinent Vitals/Pain Pain Assessment Pain Assessment: 0-10 Pain Score: 4  Pain Location: L flank Pain Descriptors / Indicators: Discomfort Pain Intervention(s): Limited activity within patient's tolerance, Monitored during session, Repositioned, Premedicated before session  PT Goals (current goals can now be found  in the care plan section) Acute Rehab PT Goals Patient Stated Goal: go home monday Progress towards PT goals: Progressing toward goals    Frequency    Min 1X/week      PT Plan Current plan remains appropriate    Co-evaluation     PT goals addressed during session: Mobility/safety with mobility;Balance;Proper use of DME;Strengthening/ROM        AM-PAC PT 6 Clicks Mobility   Outcome Measure  Help needed turning from your back to your side while in a flat bed without using bedrails?: None Help needed moving from lying on your back to sitting on the side of a flat bed without using bedrails?: A Little Help needed moving to and from a bed to a chair (including a wheelchair)?: A Little Help needed standing up from a chair using your arms (e.g., wheelchair or bedside chair)?: A Little Help needed to walk in hospital room?: A Little Help needed climbing 3-5 steps with a railing? : A Little 6 Click Score: 19    End of Session Equipment Utilized During Treatment: Oxygen  Activity Tolerance: Patient tolerated treatment well Patient left: in bed;with call bell/phone within reach Nurse Communication: Mobility status PT Visit Diagnosis: Muscle weakness (generalized) (M62.81);Difficulty in walking, not elsewhere classified (R26.2)     Time: 8993-8964 PT Time Calculation (min) (ACUTE ONLY): 29 min  Charges:    $Gait Training: 8-22 mins $Therapeutic Activity: 8-22 mins PT General Charges $$ ACUTE PT VISIT: 1 Visit                   Rankin Essex PTA 04/19/23, 12:19 PM

## 2023-04-19 NOTE — Progress Notes (Signed)
 Progress Note   Patient: Gary [name] FMW:[id number] DOB: Apr 29, 1955 DOA: 11/28/2022     140 DOS: the patient was seen and examined on 04/19/2023   Brief hospital course: 68 y.o. 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  Patient initially ran out of torsemide  and was found to be in CHF exacerbation.  Eventually became euvolemic and transition to p.o. medications.  Patient has been on trach collar status post course of antibiotics for Pseudomonas tracheitis.  Overall stable on 2 L nasal cannula.  Currently awaiting safe disposition. Difficult to place due to tracheostomy  12/23.  Eliquis  held secondary to black stools for the last couple days.  Empirically placed on Protonix . 12/25.  Hemoglobin drifted down to 8.6.  No further bowel movement since yesterday.  Case discussed with gastroenterology since bleeding is stopped we will hold off on procedures.  If rebleeds can consider a bleeding scan. 12/26.  Hemoglobin down to 8.1.  Patient agreeable for an IV for IV iron .  Still no bowel movement since the 24th.  As per Dr. Trudy 12/27-12/31/24: Pt has remained medically stable. Pt has not required a transfusion these past 5 days. Pt still wants to be d/c on 04/22/23.   04/17/2023.  Hemoglobin 8.7, creatinine 1.62  Assessment and Plan: * Acute blood loss anemia Hemoglobin went from 10.1 down to 8.1.  Last hemoglobin 8.7.  Holding Eliquis .  On oral Protonix .  Received IV iron  during hospital course.  Melena Holding Eliquis .  On oral Protonix .  No bleeding since 12/24.   Acute on chronic respiratory failure with hypoxia and hypercapnia ([name]) Currently on on 2 L.  Continue CPAP at night.  Patient went through prolonged weaning secondary to severe COPD with hypercapnic hypoxic respiratory failure and  sleep apnea.  Earlier in the hospital stay was on much more oxygen .  Patient treated for hemoptysis and tracheitis earlier in the hospital course.  Acute on chronic diastolic CHF (congestive heart failure) ([name]) Continue torsemide  twice a day.  COPD (chronic obstructive pulmonary disease) ([name]) Severe COPD on Combivent , as needed albuterol .   Acute kidney injury superimposed on CKD ([name]) Acute kidney injury on CKD stage IIIa.  Creatinine 1.62 with diuresis.  (On 9/18 did have a creatinine of 1.17).  Creatinine will likely stay higher being on torsemide  40 mg twice a day.   Anxiety On Atarax  as needed.  Continue Lexapro   OSA (obstructive sleep apnea) Continue CPAP at night  Chest pain ACS ruled out.  Hypotension Off midodrine   Dry skin Lac-Hydrin  and antifungal cream to feet.  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  Chronic colitis Continue home mesalamine   Generalized weakness Continue the hard work with physical therapy and mobility specialist in order to be discharged home with home health on 1/6.  Morbid obesity with BMI of 50.0-59.9, adult ([name]) BMI 51.20 with last height and weight in computer.         Subjective: Patient feeling okay.  Patient using CPAP at night and 2 L nasal cannula during the day.  Was working with physical therapy today.  Looking forward to getting out of the hospital.  Feels a little constipated.  Does have a little left lower quadrant pain going on for about a week.  Physical Exam: Vitals:   04/19/23 9588 04/19/23 9543 04/19/23 0809 04/19/23  0831  BP:  124/79 119/79   Pulse:  81 81 80  Resp:  18 17 18   Temp:  97.6 F (36.4 C) 97.9 F (36.6 C)   TempSrc:  Oral    SpO2:   96%   Weight: (!) 190.8 kg     Height:       Physical Exam HENT:     Head: Normocephalic.  Eyes:     General: Lids are normal.     Conjunctiva/sclera: Conjunctivae normal.  Cardiovascular:     Rate and Rhythm: Normal rate and regular rhythm.     Heart  sounds: Normal heart sounds, S1 normal and S2 normal.  Pulmonary:     Breath sounds: Examination of the right-lower field reveals decreased breath sounds. Examination of the left-lower field reveals decreased breath sounds. Decreased breath sounds present. No wheezing or rhonchi.  Abdominal:     Palpations: Abdomen is soft.     Tenderness: There is no abdominal tenderness.  Musculoskeletal:     Right lower leg: Swelling present.     Left lower leg: Swelling present.  Skin:    General: Skin is warm.     Findings: No rash.  Neurological:     Mental Status: He is alert and oriented to person, place, and time.     Data Reviewed: Last hemoglobin 9.7, last creatinine 1.62   Disposition: Status is: Inpatient Remains inpatient appropriate because: Patient plans on going home on 1/6  Planned Discharge Destination: Home with Home Health    Time spent: 28 minutes  Author: Charlie Patterson, MD 04/19/2023 1:35 PM  For on call review [email or web address].

## 2023-04-20 DIAGNOSIS — K921 Melena: Secondary | ICD-10-CM | POA: Diagnosis not present

## 2023-04-20 DIAGNOSIS — D62 Acute posthemorrhagic anemia: Secondary | ICD-10-CM | POA: Diagnosis not present

## 2023-04-20 DIAGNOSIS — I5033 Acute on chronic diastolic (congestive) heart failure: Secondary | ICD-10-CM | POA: Diagnosis not present

## 2023-04-20 DIAGNOSIS — J9621 Acute and chronic respiratory failure with hypoxia: Secondary | ICD-10-CM | POA: Diagnosis not present

## 2023-04-20 NOTE — TOC Progression Note (Signed)
 Transition of Care [facility]) - Progression Note    Patient Details  Name: Gary [name] MRN: [id number] Date of Birth: [birth year]  Transition of Care [facility]) CM/SW Contact  Alvia Olam Fabry, RN Phone Number: 04/20/2023, 1:51 PM  Clinical Narrative:    TOC notified pt can discharged on Monday. Followed up with [facility] spoke with Cassius concerning discharge disposition for Monday. Agency will still accept and are aware pt will be on a CPAP at night. No other request or inquires at this time.  Pt will need EMS for transport when medical stable for his discharge. Home address verified in [facility] with [facility] agency. TOC will continue to follow up with any additional needs.    Expected Discharge Plan: Home w Home Health Services Barriers to Discharge: Continued Medical Work up  Expected Discharge Plan and Services     Post Acute Care Choice: Home Health   Expected Discharge Date: 02/26/23               DME Arranged: Bedside commode DME Agency: [facility] Date DME Agency Contacted: 01/12/23 Time DME Agency Contacted: 1427 Representative spoke with at DME Agency: [facility] HH Arranged: PT, OT, RN HH Agency: [facility] Date [facility] Agency Contacted: 03/28/23 Time HH Agency Contacted: 1428 Representative spoke with at [facility] Agency: [place]    Social Determinants of Health (SDOH) Interventions SDOH Screenings   Food Insecurity: No Food Insecurity (12/01/2022)  Housing: Patient Declined (12/01/2022)  Transportation Needs: No Transportation Needs (12/01/2022)  Utilities: Not At Risk (12/01/2022)  Depression (PHQ2-9): Low Risk  (06/07/2021)  Financial Resource Strain: High Risk (11/21/2022)   Received from [facility]  Physical Activity: Insufficiently Active (05/03/2017)  Social Connections: Patient Declined (11/20/2022)   Received from [facility]  Stress: Stress Concern Present (11/20/2022)   Received from [facility]  Tobacco Use: Medium Risk (02/13/2023)    Readmission  Risk Interventions    03/19/2023    9:58 AM 11/05/2022    2:59 PM 11/05/2022   12:16 PM  Readmission Risk Prevention Plan  Transportation Screening Complete Complete Complete  Medication Review Oceanographer) Complete Complete Complete  PCP or Specialist appointment within 3-5 days of discharge Complete Complete Complete  HRI or Home Care Consult Complete Complete Complete  SW Recovery Care/Counseling Consult Complete Complete Complete  Palliative Care Screening Complete Complete Complete  Skilled Nursing Facility Not Applicable Not Applicable Not Applicable

## 2023-04-20 NOTE — Progress Notes (Signed)
 Progress Note   Patient: Gary [name] FMW:[id number] DOB: Jan 04, 1956 DOA: 11/28/2022     141 DOS: the patient was seen and examined on 04/20/2023   Brief hospital course: 68 y.o. 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  Patient initially ran out of torsemide  and was found to be in CHF exacerbation.  Eventually became euvolemic and transition to p.o. medications.  Patient has been on trach collar status post course of antibiotics for Pseudomonas tracheitis.  Overall stable on 2 L nasal cannula.  Currently awaiting safe disposition. Difficult to place due to tracheostomy  12/23.  Eliquis  held secondary to black stools for the last couple days.  Empirically placed on Protonix . 12/25.  Hemoglobin drifted down to 8.6.  No further bowel movement since yesterday.  Case discussed with gastroenterology since bleeding is stopped we will hold off on procedures.  If rebleeds can consider a bleeding scan. 12/26.  Hemoglobin down to 8.1.  Patient agreeable for an IV for IV iron .  Still no bowel movement since the 24th.  As per Dr. Trudy 12/27-12/31/24: Pt has remained medically stable. Pt has not required a transfusion these past 5 days. Pt still wants to be d/c on 04/22/23.   04/17/2023.  Hemoglobin 8.7, creatinine 1.62  Assessment and Plan: * Acute blood loss anemia Hemoglobin went from 10.1 down to 8.1.  Last hemoglobin 8.6.  Holding Eliquis .  On oral Protonix .  Received IV iron  during hospital course.  Melena Holding Eliquis .  On oral Protonix .  No bleeding since 12/24.   Acute on chronic respiratory failure with hypoxia and hypercapnia ([name]) Currently on on 2 L.  Continue CPAP at night.  Patient went through prolonged weaning secondary to severe COPD with hypercapnic hypoxic respiratory failure and  sleep apnea.  Earlier in the hospital stay was on much more oxygen .  Patient treated for hemoptysis and tracheitis earlier in the hospital course.  Acute on chronic diastolic CHF (congestive heart failure) ([name]) Continue torsemide  twice a day.  COPD (chronic obstructive pulmonary disease) ([name]) Severe COPD on Combivent , as needed albuterol .   Acute kidney injury superimposed on CKD ([name]) Acute kidney injury on CKD stage IIIa.  Creatinine 1.62 with diuresis.  (On 9/18 did have a creatinine of 1.17).  Creatinine will likely stay higher being on torsemide  40 mg twice a day.   Anxiety On Atarax  as needed.  Continue Lexapro   OSA (obstructive sleep apnea) Continue CPAP at night  Chest pain ACS ruled out.  Hypotension Off midodrine   Dry skin Lac-Hydrin  and antifungal cream to feet.  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  Chronic colitis Continue home mesalamine   Generalized weakness Continue the hard work with physical therapy and mobility specialist in order to be discharged home with home health on 1/6.  Morbid obesity with BMI of 50.0-59.9, adult ([name]) BMI 51.34 with last height and weight in computer.         Subjective: Patient making sure that I give him the correct medications upon going home.  Patient is breathing okay.  Stated he had a bowel movement with a suppository.  Physical Exam: Vitals:   04/19/23 2053 04/20/23 0407 04/20/23 0500 04/20/23 0837  BP: 123/78 120/76  114/79  Pulse: 86 81  80  Resp: 18 18  18   Temp: 97.9 F (36.6 C)  97.7 F (36.5 C)  97.9 F (36.6 C)  TempSrc:      SpO2: 90% 97%  96%  Weight:   (!) 191.3 kg   Height:       Physical Exam HENT:     Head: Normocephalic.  Eyes:     General: Lids are normal.     Conjunctiva/sclera: Conjunctivae normal.  Cardiovascular:     Rate and Rhythm: Normal rate and regular rhythm.     Heart sounds: Normal heart sounds, S1 normal and S2 normal.  Pulmonary:     Breath sounds: Examination  of the right-lower field reveals decreased breath sounds. Examination of the left-lower field reveals decreased breath sounds. Decreased breath sounds present. No wheezing or rhonchi.  Abdominal:     Palpations: Abdomen is soft.     Tenderness: There is no abdominal tenderness.  Musculoskeletal:     Right lower leg: Swelling present.     Left lower leg: Swelling present.  Skin:    General: Skin is warm.     Findings: No rash.  Neurological:     Mental Status: He is alert and oriented to person, place, and time.     Data Reviewed: Creatinine 1.59, hemoglobin 8.6   Disposition: Status is: Inpatient Remains inpatient appropriate because: Plan on discharge on 1/6.  Planned Discharge Destination: Home with Home Health    Time spent: 28 minutes  Author: Charlie Patterson, MD 04/20/2023 2:44 PM  For on call review [email or web address].

## 2023-04-20 NOTE — Plan of Care (Signed)
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
## 2023-04-20 NOTE — Progress Notes (Signed)
 Physical Therapy Treatment Patient Details Name: Gary [name] MRN: [id number] DOB: 10-14-1955 Today's Date: 04/20/2023   History of Present Illness 68 y/o male presented to [facility] ED on 11/28/22 for chest pain x 4 days. Admitted for acute on chronic CHF. Frequent admissions this year with most recent dsicharge on 11/21/22. PMH includes obesity, hypoxia, respiratory failure with tracheostomy, COPD, GIB, HFpEF.    PT Comments  Pt was long sitting in bed upon arrival. He agrees to session and does put forth good effort during session. Pt continues to refuse step training but was agreeable to focus on improving transfer abilities. Pt is still planning to DC home on Monday. Author reach out to care team for Dc prep ost session. Overall pt is progressing. Recommend continued skilled PT at DC to maximize independence and safety with all ADLs.     If plan is discharge home, recommend the following: A little help with walking and/or transfers;A little help with bathing/dressing/bathroom;Assistance with cooking/housework;Direct supervision/assist for medications management;Direct supervision/assist for financial management;Assist for transportation;Help with stairs or ramp for entrance;Supervision due to cognitive status     Equipment Recommendations  None recommended by PT (Per pt,  I have everything.)       Precautions / Restrictions Precautions Precautions: Fall Precaution Comments: pt remained on 2 L o2 [place] throughout Restrictions Weight Bearing Restrictions Per Provider Order: No RLE Weight Bearing Per Provider Order: Weight bearing as tolerated     Mobility  Bed Mobility Overal bed mobility: Modified Independent Bed Mobility: Supine to Sit, Sit to Supine  Supine to sit: Used rails, Supervision Sit to supine: Supervision     Transfers Overall transfer level: Needs assistance Equipment used: Rolling walker (2 wheels) (bariatric) Transfers: Sit to/from Stand Sit to Stand: Supervision       General transfer comment: pt performed STS 3 x form progressively lowered bed height. Overall demonstrates much improved transfers than previously observed. Pt requested to ambulate with mobility team later in th day. Author issued supplies to maximize his success for upcoming DC home.      Balance Overall balance assessment: Needs assistance Sitting-balance support: No upper extremity supported Sitting balance-[name] Scale: Normal     Standing balance support: Single extremity supported, During functional activity Standing balance-[name] Scale: Fair Standing balance comment: Reliant on BRW for all standing activity,    Cognition Arousal: Alert Behavior During Therapy: WFL for tasks assessed/performed Overall Cognitive Status: Within Functional Limits for tasks assessed      General Comments: Pt is A and O x4. Agreeable to session and continues to understand that MD is planning to DC pt home  monday           General Comments General comments (skin integrity, edema, etc.): Author encouraged pt to call electric w/c rep once at home to have chair delivered. pt states understanding      Pertinent Vitals/Pain Pain Assessment Pain Assessment: No/denies pain Pain Score: 0-No pain Pain Descriptors / Indicators: Discomfort Pain Intervention(s): Limited activity within patient's tolerance, Monitored during session, Premedicated before session, Repositioned     PT Goals (current goals can now be found in the care plan section) Acute Rehab PT Goals Patient Stated Goal: go home monday Progress towards PT goals: Progressing toward goals    Frequency    Min 1X/week      PT Plan Current plan remains appropriate    Co-evaluation     PT goals addressed during session: Mobility/safety with mobility  AM-PAC PT 6 Clicks Mobility   Outcome Measure  Help needed turning from your back to your side while in a flat bed without using bedrails?: None Help needed moving from  lying on your back to sitting on the side of a flat bed without using bedrails?: A Little Help needed moving to and from a bed to a chair (including a wheelchair)?: A Little Help needed standing up from a chair using your arms (e.g., wheelchair or bedside chair)?: A Little Help needed to walk in hospital room?: A Little   6 Click Score: 16    End of Session Equipment Utilized During Treatment: Oxygen  (pt on 2 L o2 throughout session with sao2 >91%) Activity Tolerance: Patient tolerated treatment well Patient left: in bed;with call bell/phone within reach Nurse Communication: Mobility status PT Visit Diagnosis: Muscle weakness (generalized) (M62.81);Difficulty in walking, not elsewhere classified (R26.2)     Time: 1040-1103 PT Time Calculation (min) (ACUTE ONLY): 23 min  Charges:    $Therapeutic Activity: 23-37 mins PT General Charges $$ ACUTE PT VISIT: 1 Visit          Rankin Essex PTA 04/20/23, 1:43 PM

## 2023-04-21 ENCOUNTER — Other Ambulatory Visit: Payer: Self-pay

## 2023-04-21 DIAGNOSIS — J9621 Acute and chronic respiratory failure with hypoxia: Secondary | ICD-10-CM | POA: Diagnosis not present

## 2023-04-21 DIAGNOSIS — I5033 Acute on chronic diastolic (congestive) heart failure: Secondary | ICD-10-CM | POA: Diagnosis not present

## 2023-04-21 DIAGNOSIS — D62 Acute posthemorrhagic anemia: Secondary | ICD-10-CM | POA: Diagnosis not present

## 2023-04-21 DIAGNOSIS — K921 Melena: Secondary | ICD-10-CM | POA: Diagnosis not present

## 2023-04-21 MED ORDER — IPRATROPIUM-ALBUTEROL 0.5-2.5 (3) MG/3ML IN SOLN
3.0000 mL | Freq: Four times a day (QID) | RESPIRATORY_TRACT | 0 refills | Status: DC | PRN
  Filled 2023-04-21: qty 360, 30d supply, fill #0

## 2023-04-21 MED ORDER — BISACODYL 10 MG RE SUPP
10.0000 mg | Freq: Every day | RECTAL | 0 refills | Status: DC | PRN
  Filled 2023-04-21: qty 36, 36d supply, fill #0
  Filled 2023-04-22: qty 12, 12d supply, fill #0

## 2023-04-21 MED ORDER — GERHARDT'S BUTT CREAM
1.0000 | TOPICAL_CREAM | Freq: Two times a day (BID) | CUTANEOUS | 0 refills | Status: DC
  Filled 2023-04-21: qty 60, 30d supply, fill #0

## 2023-04-21 MED ORDER — IPRATROPIUM-ALBUTEROL 20-100 MCG/ACT IN AERS
1.0000 | INHALATION_SPRAY | Freq: Four times a day (QID) | RESPIRATORY_TRACT | 0 refills | Status: DC
  Filled 2023-04-21 – 2023-04-22 (×2): qty 4, 30d supply, fill #0

## 2023-04-21 MED ORDER — TRAZODONE HCL 50 MG PO TABS
50.0000 mg | ORAL_TABLET | Freq: Every evening | ORAL | 0 refills | Status: DC | PRN
  Filled 2023-04-21: qty 30, 30d supply, fill #0

## 2023-04-21 MED ORDER — SPIRONOLACTONE 25 MG PO TABS
25.0000 mg | ORAL_TABLET | Freq: Every day | ORAL | 0 refills | Status: DC
  Filled 2023-04-21: qty 30, 30d supply, fill #0

## 2023-04-21 MED ORDER — BACITRACIN 500 UNIT/GM EX OINT
TOPICAL_OINTMENT | CUTANEOUS | 0 refills | Status: DC
  Filled 2023-04-21: qty 140, fill #0
  Filled 2023-04-22: qty 84, 60d supply, fill #0

## 2023-04-21 MED ORDER — MESALAMINE 400 MG PO CPDR
800.0000 mg | DELAYED_RELEASE_CAPSULE | Freq: Two times a day (BID) | ORAL | 0 refills | Status: DC
  Filled 2023-04-21: qty 120, 30d supply, fill #0

## 2023-04-21 MED ORDER — TROLAMINE SALICYLATE 10 % EX CREA
TOPICAL_CREAM | CUTANEOUS | 0 refills | Status: DC | PRN
  Filled 2023-04-21: qty 85, fill #0

## 2023-04-21 MED ORDER — PANTOPRAZOLE SODIUM 40 MG PO TBEC
40.0000 mg | DELAYED_RELEASE_TABLET | Freq: Every day | ORAL | 0 refills | Status: DC
  Filled 2023-04-21: qty 30, 30d supply, fill #0

## 2023-04-21 MED ORDER — ACETAMINOPHEN 325 MG PO TABS
650.0000 mg | ORAL_TABLET | Freq: Four times a day (QID) | ORAL | 0 refills | Status: DC | PRN
  Filled 2023-04-21: qty 100, 13d supply, fill #0

## 2023-04-21 MED ORDER — OXYCODONE HCL 5 MG PO TABS
5.0000 mg | ORAL_TABLET | Freq: Two times a day (BID) | ORAL | 0 refills | Status: DC | PRN
  Filled 2023-04-21: qty 10, 5d supply, fill #0

## 2023-04-21 MED ORDER — AMPHETAMINE-DEXTROAMPHETAMINE 30 MG PO TABS
30.0000 mg | ORAL_TABLET | Freq: Every day | ORAL | 0 refills | Status: DC
  Filled 2023-04-21: qty 30, 30d supply, fill #0

## 2023-04-21 MED ORDER — CLOTRIMAZOLE 1 % EX CREA
TOPICAL_CREAM | Freq: Two times a day (BID) | CUTANEOUS | 0 refills | Status: DC
  Filled 2023-04-21: qty 28, 30d supply, fill #0

## 2023-04-21 MED ORDER — GUAIFENESIN ER 600 MG PO TB12
1200.0000 mg | ORAL_TABLET | Freq: Two times a day (BID) | ORAL | 0 refills | Status: DC
  Filled 2023-04-21: qty 20, 5d supply, fill #0

## 2023-04-21 MED ORDER — SENNOSIDES-DOCUSATE SODIUM 8.6-50 MG PO TABS
2.0000 | ORAL_TABLET | Freq: Every day | ORAL | 0 refills | Status: DC
  Filled 2023-04-21: qty 100, 50d supply, fill #0

## 2023-04-21 MED ORDER — ESCITALOPRAM OXALATE 10 MG PO TABS
10.0000 mg | ORAL_TABLET | Freq: Every day | ORAL | 0 refills | Status: DC
  Filled 2023-04-21: qty 30, 30d supply, fill #0

## 2023-04-21 MED ORDER — HYDROXYZINE HCL 25 MG PO TABS
25.0000 mg | ORAL_TABLET | Freq: Three times a day (TID) | ORAL | 0 refills | Status: DC | PRN
  Filled 2023-04-21: qty 90, 30d supply, fill #0

## 2023-04-21 MED ORDER — LACTULOSE 10 GM/15ML PO SOLN
30.0000 g | Freq: Every day | ORAL | 0 refills | Status: AC | PRN
  Filled 2023-04-21: qty 1350, 30d supply, fill #0
  Filled 2023-04-22: qty 1422, 31d supply, fill #0

## 2023-04-21 MED ORDER — POLYVINYL ALCOHOL 1.4 % OP SOLN
1.0000 [drp] | OPHTHALMIC | 0 refills | Status: DC | PRN
  Filled 2023-04-21: qty 15, fill #0
  Filled 2023-04-22: qty 15, 30d supply, fill #0

## 2023-04-21 MED ORDER — TORSEMIDE 20 MG PO TABS
40.0000 mg | ORAL_TABLET | Freq: Two times a day (BID) | ORAL | 0 refills | Status: DC
  Filled 2023-04-21: qty 120, 30d supply, fill #0

## 2023-04-21 NOTE — Plan of Care (Addendum)
 Patient is alert and oriented X 4. He had a bowel movement yesterday, but he also  got suppository as per his request. Plan of care ongoing.      Problem: Education: Goal: Knowledge of General Education information will improve Description: Including pain rating scale, medication(s)/side effects and non-pharmacologic comfort measures Outcome: Progressing   Problem: Health Behavior/Discharge Planning: Goal: Ability to manage health-related needs will improve Outcome: Progressing   Problem: Clinical Measurements: Goal: Ability to maintain clinical measurements within normal limits will improve Outcome: Progressing Goal: Will remain free from infection Outcome: Progressing Goal: Diagnostic test results will improve Outcome: Progressing Goal: Respiratory complications will improve Outcome: Progressing Goal: Cardiovascular complication will be avoided Outcome: Progressing   Problem: Activity: Goal: Risk for activity intolerance will decrease Outcome: Progressing   Problem: Nutrition: Goal: Adequate nutrition will be maintained Outcome: Progressing   Problem: Coping: Goal: Level of anxiety will decrease Outcome: Progressing   Problem: Elimination: Goal: Will not experience complications related to bowel motility Outcome: Progressing Goal: Will not experience complications related to urinary retention Outcome: Progressing   Problem: Pain Managment: Goal: General experience of comfort will improve Outcome: Progressing   Problem: Safety: Goal: Ability to remain free from injury will improve Outcome: Progressing   Problem: Skin Integrity: Goal: Risk for impaired skin integrity will decrease Outcome: Progressing

## 2023-04-21 NOTE — Progress Notes (Signed)
 PT Cancellation Note  Patient Details Name: TAVAUGHN SILGUERO MRN: [id number] DOB: 01/03/1956   Cancelled Treatment:    Reason Eval/Treat Not Completed: Other (comment) Pt asking Nursing to contact PT for session today.  Attempted x 2 for session but he declined x 2.     Lauraine Gills 04/21/2023, 3:20 PM
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
## 2023-04-21 NOTE — Progress Notes (Signed)
 Progress Note   Patient: Gary [name] FMW:[id number] DOB: December 29, 1955 DOA: 11/28/2022     142 DOS: the patient was seen and examined on 04/21/2023   Brief hospital course: 68 y.o. 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  Patient initially ran out of torsemide  and was found to be in CHF exacerbation.  Eventually became euvolemic and transition to p.o. medications.  Patient has been on trach collar status post course of antibiotics for Pseudomonas tracheitis.  Overall stable on 2 L nasal cannula.  Currently awaiting safe disposition. Difficult to place due to tracheostomy  12/23.  Eliquis  held secondary to black stools for the last couple days.  Empirically placed on Protonix . 12/25.  Hemoglobin drifted down to 8.6.  No further bowel movement since yesterday.  Case discussed with gastroenterology since bleeding is stopped we will hold off on procedures.  If rebleeds can consider a bleeding scan. 12/26.  Hemoglobin down to 8.1.  Patient agreeable for an IV for IV iron .  Still no bowel movement since the 24th.  As per Dr. Trudy 12/27-12/31/24: Pt has remained medically stable. Pt has not required a transfusion these past 5 days. Pt still wants to be d/c on 04/22/23.   04/17/2023.  Hemoglobin 8.7, creatinine 1.62 04/19/23, creatinine 1.59, hemoglobin 8.6  Assessment and Plan: * Acute blood loss anemia Hemoglobin went from 10.1 down to 8.1.  Last hemoglobin 8.6.  Holding Eliquis .  On oral Protonix .  Received IV iron  during hospital course.  Melena Holding Eliquis .  On oral Protonix .  No bleeding since 12/24.   Acute on chronic respiratory failure with hypoxia and hypercapnia ([name]) Currently on on 2 L.  Continue CPAP at night.  Patient went through prolonged weaning secondary to severe COPD with  hypercapnic hypoxic respiratory failure and sleep apnea.  Earlier in the hospital stay was on much more oxygen .  Patient treated for hemoptysis and tracheitis earlier in the hospital course.  Acute on chronic diastolic CHF (congestive heart failure) ([name]) Continue torsemide  twice a day.  COPD (chronic obstructive pulmonary disease) ([name]) Severe COPD on Combivent , as needed albuterol .   Acute kidney injury superimposed on CKD ([name]) Acute kidney injury on CKD stage IIIa.  Creatinine 1.59 with diuresis.  (On 9/18 did have a creatinine of 1.17).  Creatinine will likely stay higher being on torsemide  40 mg twice a day.   Anxiety On Atarax  as needed.  Continue Lexapro   OSA (obstructive sleep apnea) Continue CPAP at night  Chest pain ACS ruled out.  Hypotension Off midodrine   Dry skin Continue antifungal cream to feet.  Received Lac-Hydrin  previously.  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  Chronic colitis Continue home mesalamine   Generalized weakness Plan is to discharge home on 1/6 with home health.  Morbid obesity with BMI of 50.0-59.9, adult ([name]) BMI 51.58 with last height and weight in computer.         Subjective: Patient states he feels pretty good today.  Requested a suppository with nursing staff.  Sent prescriptions over to the [facility] pharmacy.  Physical Exam: Vitals:   04/20/23 1817 04/20/23 2051 04/21/23 0537 04/21/23 0731  BP: 125/75 115/81 113/73 114/80  Pulse: 85 85 81 81  Resp: 18 20 18 16   Temp: 97.7 F (36.5 C) 98.1 F (36.7 C) 98.2 F (36.8 C)  TempSrc:      SpO2: 97% 96% (!) 83% 93%  Weight:   (!) 192.2 kg   Height:       Physical Exam HENT:     Head: Normocephalic.  Eyes:     General: Lids are normal.     Conjunctiva/sclera: Conjunctivae normal.  Cardiovascular:     Rate and Rhythm: Normal rate and regular rhythm.     Heart sounds: Normal heart sounds, S1 normal and S2 normal.  Pulmonary:     Breath sounds: Examination of the  right-lower field reveals decreased breath sounds. Examination of the left-lower field reveals decreased breath sounds. Decreased breath sounds present. No wheezing or rhonchi.  Abdominal:     Palpations: Abdomen is soft.     Tenderness: There is no abdominal tenderness.  Musculoskeletal:     Right lower leg: Swelling present.     Left lower leg: Swelling present.  Skin:    General: Skin is warm.     Findings: No rash.  Neurological:     Mental Status: He is alert and oriented to person, place, and time.     Data Reviewed: Last hemoglobin 8.6, last creatinine 1.59.   Disposition: Status is: Inpatient Remains inpatient appropriate because: The plan is to discharge on 1/6.  Planned Discharge Destination: Home with home health    Time spent: 28 minutes  Author: Charlie Patterson, MD 04/21/2023 3:48 PM  For on call review [email or web address].

## 2023-04-22 ENCOUNTER — Other Ambulatory Visit: Payer: Self-pay

## 2023-04-22 MED ORDER — MESALAMINE 1.2 G PO TBEC
1.2000 g | DELAYED_RELEASE_TABLET | Freq: Two times a day (BID) | ORAL | 0 refills | Status: DC
  Filled 2023-04-22: qty 60, 30d supply, fill #0

## 2023-04-22 NOTE — Plan of Care (Signed)
 Patient is adequate for discharge from this facility.  Transportation to previous living environment being provided by EMS.

## 2023-04-22 NOTE — Discharge Summary (Signed)
 Physician Discharge Summary   Patient: Gary [name] MRN: [id number] DOB: [birth year]  Admit date:     11/28/2022  Discharge date: 04/22/23  Discharge Physician: Charlie Patterson   PCP: Delores Corean Pollen, MD   Recommendations at discharge:   Follow-up with your medical doctor Follow-up Dr. Aleskerov one week Follow-up ENT Dr. Carolee Vaught 4 weeks.  Discharge Diagnoses: Principal Problem:   Acute blood loss anemia Active Problems:   Melena   Acute on chronic diastolic CHF (congestive heart failure) ([name])   Acute on chronic respiratory failure with hypoxia and hypercapnia ([name])   COPD (chronic obstructive pulmonary disease) ([name])   Acute kidney injury superimposed on CKD ([name])   OSA (obstructive sleep apnea)   Anxiety   Hypotension   Chest pain   Morbid obesity with BMI of 50.0-59.9, adult ([name])   Generalized weakness   Chronic colitis   Fluid overload   Eyelid cyst, right   Dry skin   Tracheitis    Hospital Course: 68 y.o. 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  Patient initially ran out of torsemide  and was found to be in CHF exacerbation.  Eventually became euvolemic and transition to p.o. medications.  Patient has been on trach collar status post course of antibiotics for Pseudomonas tracheitis.  Overall stable on 2 L nasal cannula.  Currently awaiting safe disposition. Difficult to place due to tracheostomy  12/23.  Eliquis  held secondary to black stools for the last couple days.  Empirically placed on Protonix . 12/25.  Hemoglobin drifted down to 8.6.  No further bowel movement since yesterday.  Case discussed with gastroenterology since bleeding is stopped we will hold off on procedures.  If rebleeds can consider a bleeding scan. 12/26.  Hemoglobin down to  8.1.  Patient agreeable for an IV for IV iron .  Still no bowel movement since the 24th.  As per Dr. Trudy 12/27-12/31/24: Pt has remained medically stable. Pt has not required a transfusion these past 5 days. Pt still wants to be d/c on 04/22/23.   04/17/2023.  Hemoglobin 8.7, creatinine 1.62 04/19/23, creatinine 1.59, hemoglobin 8.6  Most of the hospital course was strengthening with mobility specialist and physical therapy team.  Patient able to walk in the nursing station and improved with getting out of bed and getting up and off the commode.  Assessment and Plan: * Acute blood loss anemia Hemoglobin went from 10.1 down to 8.1.  Last hemoglobin 8.6.  Holding Eliquis .  On oral Protonix .  Received IV iron  during hospital course.  Melena Holding Eliquis .  On oral Protonix .  No bleeding since 12/24.   Acute on chronic respiratory failure with hypoxia and hypercapnia ([name]) Currently on on 2 L.  Continue CPAP 10 at night with 2 L of oxygen .  Patient went through prolonged weaning secondary to severe COPD with hypercapnic hypoxic respiratory failure and sleep apnea.  Earlier in the hospital stay was on much more oxygen .  Patient treated for hemoptysis and tracheitis earlier in the hospital course.  Acute on chronic diastolic CHF (congestive heart failure) ([name]) Continue torsemide  twice a day.  COPD (chronic obstructive pulmonary disease) ([name]) Severe COPD on Combivent , as needed albuterol .   Acute kidney injury superimposed on CKD ([name]) Acute kidney injury on CKD stage IIIa.  Creatinine 1.59 with diuresis.  (On  9/18 did have a creatinine of 1.17).  Creatinine will likely stay higher being on torsemide  40 mg twice a day.   Anxiety On Atarax  as needed.  Continue Lexapro   OSA (obstructive sleep apnea) Continue CPAP at night  Chest pain ACS ruled out.  Hypotension Off midodrine   Dry skin Continue antifungal cream to feet.  Received Lac-Hydrin  previously.  Eyelid cyst, right Lanced  by ophthalmology.  Chronic colitis Continue home mesalamine   Generalized weakness Plan is to discharge home on 1/6 with home health.  Morbid obesity with BMI of 50.0-59.9, adult ([name]) BMI 51.58 with last height and weight in computer.          Consultants: ENT, ophthalmology, pulmonary Disposition: Home health Diet recommendation:  Discharge Diet Orders (From admission, onward)     Start     Ordered   02/26/23 0000  Diet - low sodium heart healthy        02/26/23 1019           Cardiac diet DISCHARGE MEDICATION: Allergies as of 04/22/2023   No Known Allergies      Medication List     STOP taking these medications    apixaban  2.5 MG Tabs tablet Commonly known as: ELIQUIS    arformoterol  15 MCG/2ML Nebu Commonly known as: BROVANA    budesonide  0.5 MG/2ML nebulizer solution Commonly known as: PULMICORT    docusate sodium  100 MG capsule Commonly known as: COLACE   ferrous sulfate  325 (65 FE) MG tablet   melatonin 3 MG Tabs tablet   midodrine  10 MG tablet Commonly known as: PROAMATINE    mouth rinse Liqd solution   polyethylene glycol 17 g packet Commonly known as: MIRALAX  / GLYCOLAX    potassium chloride  SA 20 MEQ tablet Commonly known as: KLOR-CON  M   predniSONE  10 MG tablet Commonly known as: DELTASONE    revefenacin  175 MCG/3ML nebulizer solution Commonly known as: YUPELRI    traMADol  50 MG tablet Commonly known as: ULTRAM        TAKE these medications    acetaminophen  325 MG tablet Commonly known as: TYLENOL  Take 2 tablets (650 mg total) by mouth every 6 (six) hours as needed for mild pain (pain score 1-3) (or Fever >/= 101).   amphetamine -dextroamphetamine  30 MG tablet Commonly known as: ADDERALL Take 1 tablet by mouth daily with breakfast.   bacitracin  500 UNIT/GM ointment Apply to trach site daily.   bisacodyl  10 MG suppository Commonly known as: DULCOLAX Place 1 suppository (10 mg total) rectally daily as needed for moderate  constipation or mild constipation.   Clotrimazole  Anti-Fungal 1 % cream Generic drug: clotrimazole  Apply topically 2 (two) times daily.   Constulose  10 GM/15ML solution Generic drug: lactulose  Take 45 mLs (30 g total) by mouth daily as needed for mild constipation.   escitalopram  10 MG tablet Commonly known as: LEXAPRO  Take 1 tablet (10 mg total) by mouth daily.   Gerhardt's butt cream Crea Apply 1 Application topically 2 (two) times daily.   guaiFENesin  600 MG 12 hr tablet Commonly known as: MUCINEX  Take 2 tablets (1,200 mg total) by mouth 2 (two) times daily.   hydrOXYzine  25 MG tablet Commonly known as: ATARAX  Take 1 tablet (25 mg total) by mouth 3 (three) times daily as needed for anxiety.   ipratropium-albuterol  0.5-2.5 (3) MG/3ML Soln Commonly known as: DUONEB Take 3 mLs by nebulization every 6 (six) hours as needed. What changed: Another medication with the same name was added. Make sure you understand how and when to take each.   Combivent   Respimat 20-100 MCG/ACT Aers respimat Generic drug: Ipratropium-Albuterol  Inhale 1 puff into the lungs every 6 (six) hours. What changed: You were already taking a medication with the same name, and this prescription was added. Make sure you understand how and when to take each.   mesalamine  1.2 g EC tablet Commonly known as: LIALDA  Take 1 tablet (1.2 g total) by mouth 2 (two) times daily. What changed: how much to take   oxyCODONE  5 MG immediate release tablet Commonly known as: Oxy IR/ROXICODONE  Take 1 tablet (5 mg total) by mouth 2 (two) times daily as needed for moderate pain (pain score 4-6) or severe pain (pain score 7-10).   pantoprazole  40 MG tablet Commonly known as: PROTONIX  Take 1 tablet (40 mg total) by mouth at bedtime.   polyvinyl alcohol  1.4 % ophthalmic solution Commonly known as: LIQUIFILM TEARS Place 1 drop into both eyes as needed for dry eyes.   spironolactone  25 MG tablet Commonly known as:  ALDACTONE  Take 1 tablet (25 mg total) by mouth daily.   Stimulant Laxative 8.6-50 MG tablet Generic drug: senna-docusate Take 2 tablets by mouth at bedtime. What changed:  when to take this reasons to take this   torsemide  20 MG tablet Commonly known as: DEMADEX  Take 2 tablets (40 mg total) by mouth 2 (two) times daily. What changed: medication strength   traZODone  50 MG tablet Commonly known as: DESYREL  Take 1 tablet (50 mg total) by mouth at bedtime as needed for sleep.   trolamine salicylate 10 % cream Commonly known as: ASPERCREME Apply topically as needed for muscle pain.               Durable Medical Equipment  (From admission, onward)           Start     Ordered   04/22/23 1258  For home use only DME continuous positive airway pressure (CPAP)  Once       Question Answer Comment  Length of Need Lifetime   Patient has OSA or probable OSA Yes   Is the patient currently using CPAP in the home Yes   If yes (to question two) Determine DME provider and inform them of any new orders/settings   If no (to question two) date of sleep study nasal pillow size large, Cpap 10 with 2L of oxygen    Settings Other see comments   Signs and symptoms of probable OSA  (select all that apply) Snoring   CPAP supplies needed Mask, headgear, cushions, filters, heated tubing and water  chamber      04/22/23 1258   04/21/23 0822  For home use only DME Other see comment  Once       Comments: Wipes for after having a bowel movement  Question:  Length of Need  Answer:  12 Months   04/21/23 0822   01/12/23 0925  For home use only DME Bedside commode  Once       Comments: Bariatric  Question:  Patient needs a bedside commode to treat with the following condition  Answer:  Obesity, morbid, BMI 50 or higher ([name])   01/12/23 0924              Discharge Care Instructions  (From admission, onward)           Start     Ordered   02/26/23 0000  Discharge wound care:        Comments: Standard pressure ulcer wound care   02/26/23 1019  Follow-up Information     Parris Manna, MD Follow up in 1 week(s).   Specialty: Pulmonary Disease Contact information: [address] [place] [address] [phone]         Milissa Hamming, MD Follow up in 4 week(s).   Specialty: Otolaryngology Contact information: [address] [place] [address] [phone]         Delores Corean Pollen, MD Follow up.   Contact information: [address] [place] [address] [phone]                Discharge Exam: Fredricka Weights   04/19/23 0411 04/20/23 0500 04/21/23 0537  Weight: (!) 190.8 kg (!) 191.3 kg (!) 192.2 kg   Physical Exam HENT:     Head: Normocephalic.  Eyes:     General: Lids are normal.     Conjunctiva/sclera: Conjunctivae normal.  Cardiovascular:     Rate and Rhythm: Normal rate and regular rhythm.     Heart sounds: Normal heart sounds, S1 normal and S2 normal.  Pulmonary:     Breath sounds: Examination of the right-lower field reveals decreased breath sounds. Examination of the left-lower field reveals decreased breath sounds. Decreased breath sounds present. No wheezing or rhonchi.  Abdominal:     Palpations: Abdomen is soft.     Tenderness: There is no abdominal tenderness.  Musculoskeletal:     Right lower leg: Swelling present.     Left lower leg: Swelling present.  Skin:    General: Skin is warm.     Comments: Feet much improved.  Dried skin much improved.  Neurological:     Mental Status: He is alert and oriented to person, place, and time.      Condition at discharge: fair  The results of significant diagnostics from this hospitalization (including imaging, microbiology, ancillary and laboratory) are listed below for reference.   Imaging Studies: DG Chest Port 1 View Result Date: 03/26/2023 CLINICAL DATA:  Interstitial edema, congestive heart failure EXAM:  PORTABLE CHEST 1 VIEW COMPARISON:  03/07/2023 FINDINGS: Single frontal view of the chest demonstrates stable enlargement of the cardiac silhouette. Tracheostomy tube unchanged. Chronic vascular congestion without airspace disease, effusion, or pneumothorax. IMPRESSION: 1. Stable enlarged cardiac silhouette and pulmonary vascular congestion. No overt edema. Electronically Signed   By: Ozell Delores M.D.   On: 03/26/2023 17:35    Microbiology: Results for orders placed or performed during the hospital encounter of 11/28/22  Culture, Respiratory w Gram Stain     Status: None   Collection Time: 11/28/22  3:53 PM   Specimen: Tracheal Aspirate; Respiratory  Result Value Ref Range Status   Specimen Description   Final    TRACHEAL ASPIRATE Performed at [facility], [address], [place], [place] [address]    Special Requests   Final    NONE Performed at [facility], [address] [place], [place] [address]    Gram Stain   Final    NO WBC SEEN NO ORGANISMS SEEN Performed at [facility], [address], [place], [place] [address]    Culture FEW [name]  Final   Report Status 12/01/2022 FINAL  Final   Organism ID, Bacteria [name]  Final      Susceptibility   [name] - MIC*    CEFTAZIDIME  >=64 RESISTANT Resistant     CIPROFLOXACIN  <=0.25 SENSITIVE Sensitive     GENTAMICIN <=1 SENSITIVE Sensitive     IMIPENEM 1 SENSITIVE  Sensitive     PIP/TAZO >=128 RESISTANT Resistant     CEFEPIME  >=32 RESISTANT Resistant     * FEW [name]  SARS Coronavirus 2 by RT PCR (hospital order, performed in [facility] hospital lab) *cepheid single result test* Anterior Nasal Swab     Status: None   Collection Time: 12/04/22  2:22 PM   Specimen: Anterior Nasal Swab  Result Value Ref Range Status   SARS Coronavirus 2 by RT PCR NEGATIVE NEGATIVE Final    Comment: (NOTE) SARS-CoV-2 target nucleic acids are NOT  DETECTED.  The SARS-CoV-2 RNA is generally detectable in upper and lower respiratory specimens during the acute phase of infection. The lowest concentration of SARS-CoV-2 viral copies this assay can detect is 250 copies / mL. A negative result does not preclude SARS-CoV-2 infection and should not be used as the sole basis for treatment or other patient management decisions.  A negative result may occur with improper specimen collection / handling, submission of specimen other than nasopharyngeal swab, presence of viral mutation(s) within the areas targeted by this assay, and inadequate number of viral copies (<250 copies / mL). A negative result must be combined with clinical observations, patient history, and epidemiological information.  Fact Sheet for Patients:   roadlaptop.co.za  Fact Sheet for Healthcare Providers: http://kim-miller.com/  This test is not yet approved or  cleared by the [place]  FDA and has been authorized for detection and/or diagnosis of SARS-CoV-2 by FDA under an Emergency Use Authorization (EUA).  This EUA will remain in effect (meaning this test can be used) for the duration of the COVID-19 declaration under Section 564(b)(1) of the Act, 21 U.S.C. section 360bbb-3(b)(1), unless the authorization is terminated or revoked sooner.  Performed at [facility], [address] [place], [place] [address]     Labs: CBC: Recent Labs  Lab 04/16/23 0441 04/17/23 0836 04/19/23 1558  WBC 7.8 7.9 9.7  HGB 8.6* 8.7* 8.6*  HCT 31.4* 32.3* 32.4*  MCV 78.1* 79.2* 80.2  PLT 244 262 255   Basic Metabolic Panel: Recent Labs  Lab 04/16/23 0441 04/17/23 0836 04/19/23 1558  NA 138 135 139  K 3.5 3.3* 3.8  CL 98 94* 95*  CO2 31 31 28   GLUCOSE 128* 129* 160*  BUN 24* 21 27*  CREATININE 1.61* 1.62* 1.59*  CALCIUM  8.1* 8.1* 8.2*   Liver Function Tests: No results for input(s): AST, ALT,  ALKPHOS, BILITOT, PROT, ALBUMIN  in the last 168 hours. CBG: No results for input(s): GLUCAP in the last 168 hours.  Discharge time spent: greater than 30 minutes.  Signed: Charlie Patterson, MD [facility] 04/22/2023

## 2023-04-22 NOTE — Progress Notes (Signed)
 Physical Therapy Treatment Patient Details Name: Gary [name] MRN: [id number] DOB: [birth year] Today's Date: 04/22/2023   History of Present Illness 68 y/o male presented to [facility] ED on 11/28/22 for chest pain x 4 days. Admitted for acute on chronic CHF. Frequent admissions this year with most recent dsicharge on 11/21/22. PMH includes obesity, hypoxia, respiratory failure with tracheostomy, COPD, GIB, HFpEF.    PT Comments  Agrees to PT session with mobility tech in attendance.  He is able to get in and out of bed on his own with rail support.  Self selects gait distance of 38' with RW and no LOB or buckling noted.  Steady with static standing.  Returns to bed after gait as he is awaiting intervention from nursing.    Pt is again given rep for wheelchair to arrange delivery.  Damien Sjogren [phone].  He is encouraged to call ASAP before leaving hospital to arrange delivery today.  Voiced understanding.   If plan is discharge home, recommend the following: A little help with walking and/or transfers;A little help with bathing/dressing/bathroom;Assistance with cooking/housework;Direct supervision/assist for medications management;Direct supervision/assist for financial management;Assist for transportation;Help with stairs or ramp for entrance;Supervision due to cognitive status   Can travel by private vehicle        Equipment Recommendations  None recommended by PT    Recommendations for Other Services       Precautions / Restrictions Precautions Precautions: Fall Precaution Comments: capped trach/ 2l O2 [place] increased for 4 L during OOB activity Restrictions Weight Bearing Restrictions Per Provider Order: No     Mobility  Bed Mobility Overal bed mobility: Modified Independent                  Transfers Overall transfer level: Needs assistance Equipment used: Rolling walker (2 wheels) Transfers: Sit to/from Stand Sit to Stand: Supervision                 Ambulation/Gait Ambulation/Gait assistance: Supervision Gait Distance (Feet): 70 Feet Assistive device: Rolling walker (2 wheels)   Gait velocity: decreased     General Gait Details: steady with no LOB or buckling   Stairs             Wheelchair Mobility     Tilt Bed    Modified Rankin (Stroke Patients Only)       Balance Overall balance assessment: Needs assistance Sitting-balance support: No upper extremity supported Sitting balance-[name] Scale: Normal     Standing balance support: Single extremity supported, During functional activity Standing balance-[name] Scale: Fair Standing balance comment: Reliant on BRW for all standing activity,                            Cognition Arousal: Alert Behavior During Therapy: WFL for tasks assessed/performed Overall Cognitive Status: Within Functional Limits for tasks assessed                                          Exercises      General Comments        Pertinent Vitals/Pain Pain Assessment Pain Assessment: Faces Faces Pain Scale: Hurts a little bit Pain Location: L upper leg  - pt reports it feels like fluid Pain Descriptors / Indicators: Sore    Home Living  Prior Function            PT Goals (current goals can now be found in the care plan section) Progress towards PT goals: Progressing toward goals    Frequency    Min 1X/week      PT Plan Current plan remains appropriate    Co-evaluation              AM-PAC PT 6 Clicks Mobility   Outcome Measure  Help needed turning from your back to your side while in a flat bed without using bedrails?: None Help needed moving from lying on your back to sitting on the side of a flat bed without using bedrails?: A Little Help needed moving to and from a bed to a chair (including a wheelchair)?: A Little Help needed standing up from a chair using your arms (e.g., wheelchair or  bedside chair)?: A Little Help needed to walk in hospital room?: A Little Help needed climbing 3-5 steps with a railing? : A Little 6 Click Score: 19    End of Session Equipment Utilized During Treatment: Oxygen  Activity Tolerance: Patient tolerated treatment well Patient left: in bed;with call bell/phone within reach Nurse Communication: Mobility status PT Visit Diagnosis: Muscle weakness (generalized) (M62.81);Difficulty in walking, not elsewhere classified (R26.2)     Time: 9089-9074 PT Time Calculation (min) (ACUTE ONLY): 15 min  Charges:    $Gait Training: 8-22 mins PT General Charges $$ ACUTE PT VISIT: 1 Visit                   Lauraine Gills, PTA 04/22/23, 9:34 AM

## 2023-04-22 NOTE — TOC Transition Note (Signed)
 Transition of Care [facility]) - Discharge Note   Patient Details  Name: Gary [name] MRN: [id number] Date of Birth: 1956/02/13  [facility]) CM/SW Contact:  Dai Mcadams A Jamaia Brum, RN Phone Number: 04/22/2023, 4:31 PM   Clinical Narrative:    Chart reviewed.  Noted that patient has orders for discharge today.   I have spoken with Mitch from [employer].  He informs me that he has spoken with Dr. Braxton office and they have provided order for Sleep Study. [employer] will provider loaner CPAP until CPAP is approved.  [employer] has also received CPAP orders.    I have made [place]  with [facility] aware that patient will be a discharge for today.  [facility] will provide Home Health PT, OT, RN, and in home aide.   [employer] will provide loaner CPAP at home.    I have arranged [facility] to provide transport to patient's home today.    I have informed staff nurse of the above information.    Final next level of care: Home w Home Health Services Barriers to Discharge: No Barriers Identified   Patient Goals and CMS Choice Patient states their goals for this hospitalization and ongoing recovery are:: to go home CMS Medicare.gov Compare Post Acute Care list provided to:: Patient Choice offered to / list presented to : Patient      Discharge Placement                Patient to be transferred to facility by: [facility]   Patient and family notified of of transfer: 04/22/23  Discharge Plan and Services Additional resources added to the After Visit Summary for       Post Acute Care Choice: Home Health          DME Arranged: Continuous positive airway pressure (CPAP) DME Agency: [facility] Date DME Agency Contacted: 04/22/23 Time DME Agency Contacted: 1427 Representative spoke with at DME Agency: Mitch HH Arranged: RN, PT, OT, Nurse's Aide HH Agency: [facility] Date [facility] Agency Contacted: 04/22/23 Time HH Agency Contacted: 1428 Representative spoke with at [facility]  Agency: [place]   Social Drivers of Health (SDOH) Interventions SDOH Screenings   Food Insecurity: No Food Insecurity (12/01/2022)  Housing: Patient Declined (12/01/2022)  Transportation Needs: No Transportation Needs (12/01/2022)  Utilities: Not At Risk (12/01/2022)  Depression (PHQ2-9): Low Risk  (06/07/2021)  Financial Resource Strain: High Risk (11/21/2022)   Received from [facility]  Physical Activity: Insufficiently Active (05/03/2017)  Social Connections: Patient Declined (11/20/2022)   Received from [facility]  Stress: Stress Concern Present (11/20/2022)   Received from [facility]  Tobacco Use: Medium Risk (02/13/2023)     Readmission Risk Interventions    03/19/2023    9:58 AM 11/05/2022    2:59 PM 11/05/2022   12:16 PM  Readmission Risk Prevention Plan  Transportation Screening Complete Complete Complete  Medication Review Oceanographer) Complete Complete Complete  PCP or Specialist appointment within 3-5 days of discharge Complete Complete Complete  HRI or Home Care Consult Complete Complete Complete  SW Recovery Care/Counseling Consult Complete Complete Complete  Palliative Care Screening Complete Complete Complete  Skilled Nursing Facility Not Applicable Not Applicable Not Applicable

## 2023-04-23 NOTE — TOC Transition Note (Signed)
 Transition of Care [facility]) - Discharge Note   Patient Details  Name: Gary [name] MRN: [id number] Date of Birth: [birth year]  Transition of Care [facility]) CM/SW Contact:  Kongmeng Santoro A Amylia Collazos, RN Phone Number: 04/23/2023, 12:13 PM   Clinical Narrative:    Chart reviewed.  Noted that patient was not able to discharge yesterday. I have been informed that patient informed staff that he did not have anyone to bring him his house key late at night.  Also EMS transport would be very late picking him up from the hospital.    I have spoken with patient today.  Patient informs me that that his friend Tonya that is watching his home will bring him the house key today before 12 noon.  He reports if she is unable to drop key off she will leave in the mailbox for Mrs. Hales.    I have informed [place]  with [facility] that patient will be a discharge for today.  [facility] will provide patient Home Health PT, OT, RN, and in home aide.    I have also made Mitch with [employer] aware that patient would be discharging today.  [employer] will provide patient a loaner CPAP until insurance can approve CPAP for patient.  Sleep study also have been arranged via Pulmonary office.    I have arranged EMS transport to pick patient up today after 12 noon.  I have made patient and staff nurse aware of the above information.   Final next level of care: Home w Home Health Services Barriers to Discharge: No Barriers Identified   Patient Goals and CMS Choice Patient states their goals for this hospitalization and ongoing recovery are:: to go home CMS Medicare.gov Compare Post Acute Care list provided to:: Patient Choice offered to / list presented to : Patient      Discharge Placement                Patient to be transferred to facility by: [facility]   Patient and family notified of of transfer: 04/23/23  Discharge Plan and Services Additional resources added to the After Visit Summary for       Post Acute  Care Choice: Home Health          DME Arranged: Continuous positive airway pressure (CPAP) DME Agency: [facility] Date DME Agency Contacted: 04/23/23 Time DME Agency Contacted: 1427 Representative spoke with at DME Agency: Mitch HH Arranged: RN, PT, OT, Nurse's Aide HH Agency: [facility] Date [facility] Agency Contacted: 04/23/23 Time HH Agency Contacted: 1428 Representative spoke with at [facility] Agency: [place]   Social Drivers of Health (SDOH) Interventions SDOH Screenings   Food Insecurity: No Food Insecurity (12/01/2022)  Housing: Patient Declined (12/01/2022)  Transportation Needs: No Transportation Needs (12/01/2022)  Utilities: Not At Risk (12/01/2022)  Depression (PHQ2-9): Low Risk  (06/07/2021)  Financial Resource Strain: High Risk (11/21/2022)   Received from [facility] Care  Physical Activity: Insufficiently Active (05/03/2017)  Social Connections: Patient Declined (11/20/2022)   Received from [facility]  Stress: Stress Concern Present (11/20/2022)   Received from [facility]  Tobacco Use: Medium Risk (02/13/2023)     Readmission Risk Interventions    03/19/2023    9:58 AM 11/05/2022    2:59 PM 11/05/2022   12:16 PM  Readmission Risk Prevention Plan  Transportation Screening Complete Complete Complete  Medication Review (RN Care Manager) Complete Complete Complete  PCP or Specialist appointment within 3-5 days of discharge Complete Complete Complete  HRI or Home Care  Consult Complete Complete Complete  SW Recovery Care/Counseling Consult Complete Complete Complete  Palliative Care Screening Complete Complete Complete  Skilled Nursing Facility Not Applicable Not Applicable Not Applicable

## 2023-04-23 NOTE — Progress Notes (Signed)
 Physical Exam HENT:     Head: Normocephalic.  Eyes:     General: Lids are normal.     Conjunctiva/sclera: Conjunctivae normal.  Cardiovascular:     Rate and Rhythm: Normal rate and regular rhythm.     Heart sounds: Normal heart sounds, S1 normal and S2 normal.  Pulmonary:     Breath sounds: Examination of the right-lower field reveals decreased breath sounds. Examination of the left-lower field reveals decreased breath sounds. Decreased breath sounds present. No wheezing or rhonchi.  Abdominal:     Palpations: Abdomen is soft.     Tenderness: There is no abdominal tenderness.  Musculoskeletal:     Right lower leg: Swelling present.     Left lower leg: Swelling present.  Skin:    General: Skin is warm.     Comments: Feet much improved.  Dried skin much improved.  Neurological:     Mental Status: He is alert and oriented to person, place, and time.     Patient was discharged yesterday but EMS was backed up and the person that had his keys for the appointment had to go to work last night so we had to make discharge happen today.  Patient is still stable for discharge.  Patient seen and examined.  No charge for today since discharged yesterday. Dr. Charlie Patterson

## 2023-04-23 NOTE — Plan of Care (Signed)

## 2023-04-23 NOTE — Progress Notes (Signed)
 EMS on unit to transport patient home. Patient confirmed if significant other is not at home the key will be in the mailbox.

## 2023-05-09 DIAGNOSIS — J449 Chronic obstructive pulmonary disease, unspecified: Secondary | ICD-10-CM | POA: Diagnosis not present

## 2023-05-10 DIAGNOSIS — J449 Chronic obstructive pulmonary disease, unspecified: Secondary | ICD-10-CM | POA: Diagnosis not present

## 2023-05-10 DIAGNOSIS — J9621 Acute and chronic respiratory failure with hypoxia: Secondary | ICD-10-CM | POA: Diagnosis not present

## 2023-05-14 ENCOUNTER — Other Ambulatory Visit: Payer: Self-pay

## 2023-05-14 ENCOUNTER — Emergency Department: Payer: No Typology Code available for payment source

## 2023-05-14 ENCOUNTER — Inpatient Hospital Stay
Admission: EM | Admit: 2023-05-14 | Discharge: 2023-05-20 | DRG: 189 | Disposition: A | Discharge status: Home Health | Payer: No Typology Code available for payment source | Attending: Internal Medicine | Admitting: Internal Medicine

## 2023-05-14 DIAGNOSIS — J9601 Acute respiratory failure with hypoxia: Secondary | ICD-10-CM | POA: Diagnosis not present

## 2023-05-14 DIAGNOSIS — D631 Anemia in chronic kidney disease: Secondary | ICD-10-CM | POA: Diagnosis present

## 2023-05-14 DIAGNOSIS — R051 Acute cough: Secondary | ICD-10-CM | POA: Diagnosis not present

## 2023-05-14 DIAGNOSIS — J441 Chronic obstructive pulmonary disease with (acute) exacerbation: Secondary | ICD-10-CM | POA: Diagnosis present

## 2023-05-14 DIAGNOSIS — J9622 Acute and chronic respiratory failure with hypercapnia: Secondary | ICD-10-CM | POA: Diagnosis not present

## 2023-05-14 DIAGNOSIS — F419 Anxiety disorder, unspecified: Secondary | ICD-10-CM | POA: Diagnosis present

## 2023-05-14 DIAGNOSIS — R531 Weakness: Secondary | ICD-10-CM

## 2023-05-14 DIAGNOSIS — I5033 Acute on chronic diastolic (congestive) heart failure: Secondary | ICD-10-CM | POA: Diagnosis present

## 2023-05-14 DIAGNOSIS — N1831 Chronic kidney disease, stage 3a: Secondary | ICD-10-CM | POA: Diagnosis present

## 2023-05-14 DIAGNOSIS — K625 Hemorrhage of anus and rectum: Secondary | ICD-10-CM

## 2023-05-14 DIAGNOSIS — Z7901 Long term (current) use of anticoagulants: Secondary | ICD-10-CM

## 2023-05-14 DIAGNOSIS — R059 Cough, unspecified: Secondary | ICD-10-CM | POA: Diagnosis not present

## 2023-05-14 DIAGNOSIS — I5030 Unspecified diastolic (congestive) heart failure: Secondary | ICD-10-CM | POA: Diagnosis present

## 2023-05-14 DIAGNOSIS — Z86718 Personal history of other venous thrombosis and embolism: Secondary | ICD-10-CM

## 2023-05-14 DIAGNOSIS — E873 Alkalosis: Secondary | ICD-10-CM | POA: Diagnosis present

## 2023-05-14 DIAGNOSIS — J449 Chronic obstructive pulmonary disease, unspecified: Secondary | ICD-10-CM | POA: Diagnosis not present

## 2023-05-14 DIAGNOSIS — R0602 Shortness of breath: Secondary | ICD-10-CM | POA: Diagnosis not present

## 2023-05-14 DIAGNOSIS — A0811 Acute gastroenteropathy due to Norwalk agent: Secondary | ICD-10-CM | POA: Diagnosis present

## 2023-05-14 DIAGNOSIS — R197 Diarrhea, unspecified: Secondary | ICD-10-CM

## 2023-05-14 DIAGNOSIS — I130 Hypertensive heart and chronic kidney disease with heart failure and stage 1 through stage 4 chronic kidney disease, or unspecified chronic kidney disease: Secondary | ICD-10-CM | POA: Diagnosis present

## 2023-05-14 DIAGNOSIS — N179 Acute kidney failure, unspecified: Secondary | ICD-10-CM | POA: Diagnosis present

## 2023-05-14 DIAGNOSIS — J9811 Atelectasis: Secondary | ICD-10-CM | POA: Diagnosis not present

## 2023-05-14 DIAGNOSIS — K922 Gastrointestinal hemorrhage, unspecified: Secondary | ICD-10-CM | POA: Diagnosis present

## 2023-05-14 DIAGNOSIS — I714 Abdominal aortic aneurysm, without rupture, unspecified: Secondary | ICD-10-CM | POA: Diagnosis present

## 2023-05-14 DIAGNOSIS — Z87891 Personal history of nicotine dependence: Secondary | ICD-10-CM | POA: Diagnosis not present

## 2023-05-14 DIAGNOSIS — Z6841 Body Mass Index (BMI) 40.0 and over, adult: Secondary | ICD-10-CM

## 2023-05-14 DIAGNOSIS — Z8711 Personal history of peptic ulcer disease: Secondary | ICD-10-CM

## 2023-05-14 DIAGNOSIS — J9621 Acute and chronic respiratory failure with hypoxia: Secondary | ICD-10-CM | POA: Diagnosis not present

## 2023-05-14 DIAGNOSIS — K5791 Diverticulosis of intestine, part unspecified, without perforation or abscess with bleeding: Secondary | ICD-10-CM | POA: Diagnosis present

## 2023-05-14 DIAGNOSIS — G4733 Obstructive sleep apnea (adult) (pediatric): Secondary | ICD-10-CM | POA: Diagnosis present

## 2023-05-14 DIAGNOSIS — D62 Acute posthemorrhagic anemia: Secondary | ICD-10-CM | POA: Diagnosis present

## 2023-05-14 DIAGNOSIS — E876 Hypokalemia: Secondary | ICD-10-CM | POA: Diagnosis present

## 2023-05-14 DIAGNOSIS — Z833 Family history of diabetes mellitus: Secondary | ICD-10-CM

## 2023-05-14 DIAGNOSIS — Z8616 Personal history of COVID-19: Secondary | ICD-10-CM

## 2023-05-14 DIAGNOSIS — Z1152 Encounter for screening for COVID-19: Secondary | ICD-10-CM

## 2023-05-14 DIAGNOSIS — Z79899 Other long term (current) drug therapy: Secondary | ICD-10-CM

## 2023-05-14 DIAGNOSIS — Z823 Family history of stroke: Secondary | ICD-10-CM

## 2023-05-14 DIAGNOSIS — E785 Hyperlipidemia, unspecified: Secondary | ICD-10-CM | POA: Diagnosis present

## 2023-05-14 DIAGNOSIS — Z930 Tracheostomy status: Secondary | ICD-10-CM

## 2023-05-14 LAB — COMPREHENSIVE METABOLIC PANEL
ALT: 11 U/L (ref 0–44)
AST: 13 U/L — ABNORMAL LOW (ref 15–41)
Albumin: 2.9 g/dL — ABNORMAL LOW (ref 3.5–5.0)
Alkaline Phosphatase: 92 U/L (ref 38–126)
Anion gap: 9 (ref 5–15)
BUN: 22 mg/dL (ref 8–23)
CO2: 33 mmol/L — ABNORMAL HIGH (ref 22–32)
Calcium: 8 mg/dL — ABNORMAL LOW (ref 8.9–10.3)
Chloride: 95 mmol/L — ABNORMAL LOW (ref 98–111)
Creatinine, Ser: 1.36 mg/dL — ABNORMAL HIGH (ref 0.61–1.24)
GFR, Estimated: 58 mL/min — ABNORMAL LOW (ref >60)
Glucose, Bld: 105 mg/dL — ABNORMAL HIGH (ref 70–99)
Potassium: 3.5 mmol/L (ref 3.5–5.1)
Sodium: 137 mmol/L (ref 135–145)
Total Bilirubin: 0.9 mg/dL (ref 0.0–1.2)
Total Protein: 8.8 g/dL — ABNORMAL HIGH (ref 6.5–8.1)

## 2023-05-14 LAB — CBC WITH DIFFERENTIAL/PLATELET
Abs Immature Granulocytes: 0.03 10*3/uL (ref 0.00–0.07)
Basophils Absolute: 0 10*3/uL (ref 0.0–0.1)
Basophils Relative: 0 %
Eosinophils Absolute: 0.1 10*3/uL (ref 0.0–0.5)
Eosinophils Relative: 1 %
HCT: 38.7 % — ABNORMAL LOW (ref 39.0–52.0)
Hemoglobin: 9.8 g/dL — ABNORMAL LOW (ref 13.0–17.0)
Immature Granulocytes: 0 %
Lymphocytes Relative: 6 %
Lymphs Abs: 0.5 10*3/uL — ABNORMAL LOW (ref 0.7–4.0)
MCH: 19.8 pg — ABNORMAL LOW (ref 26.0–34.0)
MCHC: 25.3 g/dL — ABNORMAL LOW (ref 30.0–36.0)
MCV: 78.3 fL — ABNORMAL LOW (ref 80.0–100.0)
Monocytes Absolute: 0.5 10*3/uL (ref 0.1–1.0)
Monocytes Relative: 7 %
Neutro Abs: 6.4 10*3/uL (ref 1.7–7.7)
Neutrophils Relative %: 86 %
Platelets: 205 10*3/uL (ref 150–400)
RBC: 4.94 MIL/uL (ref 4.22–5.81)
RDW: 19.9 % — ABNORMAL HIGH (ref 11.5–15.5)
WBC: 7.5 10*3/uL (ref 4.0–10.5)
nRBC: 0.4 % — ABNORMAL HIGH (ref 0.0–0.2)

## 2023-05-14 LAB — BLOOD GAS, VENOUS
Acid-Base Excess: 7.6 mmol/L — ABNORMAL HIGH (ref 0.0–2.0)
Acid-Base Excess: 7.6 mmol/L — ABNORMAL HIGH (ref 0.0–2.0)
Bicarbonate: 38.2 mmol/L — ABNORMAL HIGH (ref 20.0–28.0)
Bicarbonate: 37.7 mmol/L — ABNORMAL HIGH (ref 20.0–28.0)
Delivery systems: POSITIVE
Delivery systems: POSITIVE
FIO2: 40 %
O2 Content: 6 L/min
O2 Saturation: 90.6 %
O2 Saturation: 87.1 %
Patient temperature: 37
Patient temperature: 37
pCO2, Ven: 87 mm[Hg] (ref 44–60)
pCO2, Ven: 82 mm[Hg] (ref 44–60)
pH, Ven: 7.25 (ref 7.25–7.43)
pH, Ven: 7.27 (ref 7.25–7.43)
pO2, Ven: 64 mm[Hg] — ABNORMAL HIGH (ref 32–45)
pO2, Ven: 56 mm[Hg] — ABNORMAL HIGH (ref 32–45)

## 2023-05-14 LAB — RESPIRATORY PANEL BY PCR

## 2023-05-14 LAB — RESP PANEL BY RT-PCR (RSV, FLU A&B, COVID)  RVPGX2
Influenza A by PCR: NEGATIVE
Influenza B by PCR: NEGATIVE
Resp Syncytial Virus by PCR: NEGATIVE
SARS Coronavirus 2 by RT PCR: NEGATIVE

## 2023-05-14 LAB — BRAIN NATRIURETIC PEPTIDE: B Natriuretic Peptide: 36.2 pg/mL (ref 0.0–100.0)

## 2023-05-14 LAB — LACTIC ACID, PLASMA
Lactic Acid, Venous: 0.9 mmol/L (ref 0.5–1.9)
Lactic Acid, Venous: 0.8 mmol/L (ref 0.5–1.9)

## 2023-05-14 LAB — TROPONIN I (HIGH SENSITIVITY)
Troponin I (High Sensitivity): 8 ng/L (ref <18)
Troponin I (High Sensitivity): 8 ng/L (ref <18)

## 2023-05-14 MED ORDER — OXYCODONE HCL 5 MG PO TABS
5.0000 mg | ORAL_TABLET | Freq: Two times a day (BID) | ORAL | Status: DC | PRN
  Administered 2023-05-18 – 2023-05-19 (×4): 5 mg via ORAL
  Filled 2023-05-14 (×4): qty 1

## 2023-05-14 MED ORDER — TRAZODONE HCL 50 MG PO TABS
50.0000 mg | ORAL_TABLET | Freq: Every evening | ORAL | Status: DC | PRN
  Administered 2023-05-16 – 2023-05-19 (×4): 50 mg via ORAL
  Filled 2023-05-14 (×4): qty 1

## 2023-05-14 MED ORDER — METHYLPREDNISOLONE SODIUM SUCC 125 MG IJ SOLR
125.0000 mg | Freq: Once | INTRAMUSCULAR | Status: AC
  Administered 2023-05-14: 125 mg via INTRAVENOUS
  Filled 2023-05-14: qty 2

## 2023-05-14 MED ORDER — ENOXAPARIN SODIUM 100 MG/ML IJ SOSY
100.0000 mg | PREFILLED_SYRINGE | INTRAMUSCULAR | Status: DC
  Administered 2023-05-14 – 2023-05-16 (×2): 100 mg via SUBCUTANEOUS
  Filled 2023-05-14 (×3): qty 1

## 2023-05-14 MED ORDER — ESCITALOPRAM OXALATE 10 MG PO TABS
10.0000 mg | ORAL_TABLET | Freq: Every day | ORAL | Status: DC
  Administered 2023-05-14 – 2023-05-20 (×7): 10 mg via ORAL
  Filled 2023-05-14 (×7): qty 1

## 2023-05-14 MED ORDER — IPRATROPIUM-ALBUTEROL 0.5-2.5 (3) MG/3ML IN SOLN
6.0000 mL | Freq: Once | RESPIRATORY_TRACT | Status: AC
  Administered 2023-05-14: 6 mL via RESPIRATORY_TRACT
  Filled 2023-05-14: qty 3

## 2023-05-14 MED ORDER — ACETAMINOPHEN 325 MG PO TABS: 650.0000 mg | ORAL_TABLET | Freq: Four times a day (QID) | ORAL | Status: DC | PRN

## 2023-05-14 MED ORDER — HYDROXYZINE HCL 50 MG PO TABS
25.0000 mg | ORAL_TABLET | Freq: Three times a day (TID) | ORAL | Status: DC | PRN
  Administered 2023-05-16 – 2023-05-18 (×3): 25 mg via ORAL
  Filled 2023-05-14 (×3): qty 1

## 2023-05-14 MED ORDER — METHYLPREDNISOLONE SODIUM SUCC 40 MG IJ SOLR
40.0000 mg | INTRAMUSCULAR | Status: DC
  Administered 2023-05-14: 40 mg via INTRAVENOUS
  Filled 2023-05-14: qty 1

## 2023-05-14 MED ORDER — ACETAMINOPHEN 650 MG RE SUPP
650.0000 mg | Freq: Four times a day (QID) | RECTAL | Status: DC | PRN
Start: 2023-05-14 — End: 2023-05-20

## 2023-05-14 MED ORDER — IPRATROPIUM-ALBUTEROL 0.5-2.5 (3) MG/3ML IN SOLN: 3.0000 mL | Freq: Four times a day (QID) | RESPIRATORY_TRACT | Status: DC | PRN

## 2023-05-14 MED ORDER — FUROSEMIDE 10 MG/ML IJ SOLN
80.0000 mg | Freq: Once | INTRAMUSCULAR | Status: AC
Start: 2023-05-14 — End: 2023-05-14
  Administered 2023-05-14: 80 mg via INTRAVENOUS
  Filled 2023-05-14: qty 8

## 2023-05-14 MED ORDER — MESALAMINE 1.2 G PO TBEC
1.2000 g | DELAYED_RELEASE_TABLET | Freq: Two times a day (BID) | ORAL | Status: DC
  Administered 2023-05-14 – 2023-05-20 (×12): 1.2 g via ORAL
  Filled 2023-05-14 (×13): qty 1

## 2023-05-14 MED ORDER — PANTOPRAZOLE SODIUM 40 MG PO TBEC
40.0000 mg | DELAYED_RELEASE_TABLET | Freq: Every day | ORAL | Status: DC
  Administered 2023-05-14 – 2023-05-19 (×6): 40 mg via ORAL
  Filled 2023-05-14 (×6): qty 1

## 2023-05-14 MED ORDER — IOHEXOL 350 MG/ML SOLN
75.0000 mL | Freq: Once | INTRAVENOUS | Status: AC | PRN
  Administered 2023-05-14: 75 mL via INTRAVENOUS

## 2023-05-14 MED ORDER — TORSEMIDE 20 MG PO TABS
40.0000 mg | ORAL_TABLET | Freq: Two times a day (BID) | ORAL | Status: DC
  Administered 2023-05-14 – 2023-05-20 (×12): 40 mg via ORAL
  Filled 2023-05-14 (×12): qty 2

## 2023-05-14 MED ORDER — IOHEXOL 350 MG/ML SOLN
100.0000 mL | Freq: Once | INTRAVENOUS | Status: AC | PRN
  Administered 2023-05-14: 100 mL via INTRAVENOUS

## 2023-05-14 MED ORDER — AMPHETAMINE-DEXTROAMPHETAMINE 10 MG PO TABS
30.0000 mg | ORAL_TABLET | Freq: Every day | ORAL | Status: DC
  Administered 2023-05-15 – 2023-05-20 (×6): 30 mg via ORAL
  Filled 2023-05-14 (×6): qty 3

## 2023-05-14 MED ORDER — SPIRONOLACTONE 25 MG PO TABS
25.0000 mg | ORAL_TABLET | Freq: Every day | ORAL | Status: DC
  Administered 2023-05-14 – 2023-05-20 (×7): 25 mg via ORAL
  Filled 2023-05-14 (×7): qty 1

## 2023-05-14 MED ORDER — LEVOFLOXACIN IN D5W 750 MG/150ML IV SOLN
750.0000 mg | INTRAVENOUS | Status: DC
  Administered 2023-05-14: 750 mg via INTRAVENOUS
  Filled 2023-05-14: qty 150

## 2023-05-14 NOTE — Progress Notes (Signed)
PHARMACIST - PHYSICIAN COMMUNICATION  CONCERNING:  Enoxaparin (Lovenox) for DVT Prophylaxis    RECOMMENDATION: Patient was prescribed enoxaprin 40mg  q24 hours for VTE prophylaxis.   Filed Weights   05/14/23 0136  Weight: (!) 199.6 kg (440 lb)    Body mass index is 53.56 kg/m.  Estimated Creatinine Clearance: 101 mL/min (A) (by C-G formula based on SCr of 1.36 mg/dL (H)).   Based on [facility] policy patient is candidate for enoxaparin 0.5mg /kg TBW SQ every 24 hours based on BMI being >30.  DESCRIPTION: Pharmacy has adjusted enoxaparin dose per [facility] policy.  Patient is now receiving enoxaparin 100 mg every 24 hours    Barrie Folk, PharmD Clinical Pharmacist  05/14/2023 4:14 PM

## 2023-05-14 NOTE — ED Notes (Signed)
This RN in Ct with pt. CT tech unable to use [place] IV. Pt taken back to room.

## 2023-05-14 NOTE — ED Notes (Signed)
IV team at bedside

## 2023-05-14 NOTE — ED Notes (Signed)
Pt back in room. Pt reports new centralized CP. Oxygen sats between 80-86% on 10L. EKG obtained. Pt sat upright to promote optimal breathing. EDP made aware.

## 2023-05-14 NOTE — ED Provider Notes (Signed)
.----------------------------------------- 7:22 AM on 05/14/2023 -----------------------------------------  Blood pressure 118/76, pulse (!) 102, temperature 98.3 F (36.8 C), temperature source Oral, resp. rate (!) 27, height 6\' 4"  (1.93 m), weight (!) 199.6 kg, SpO2 98%.  Assuming care from Dr. Modesto Charon.  In short, Gary [name] is a 68 y.o. male with a chief complaint of Cough and Weakness .  Refer to the original H&P for additional details.  The current plan of care is to CT PE, will need admission for likely COPD exacerbation, viral resp infection with increased O2 requirement.  Repeat EKG showed sinus tachycardia, rate 100, normal QRS, slightly prolonged QTc of 487, no ischemic ST elevation, baseline is wandering but appears to have T wave flattening in 1, aVL, no significant change compared to prior.  Chest x-ray on my interpretation showed some vascular congestion.  Patient does have history of CHF and is on torsemide.  Will give him a dose of IV Lasix.  Patient on 10 L hyponasal cannula, sleeping, initially satting in the 90s, but this down into the 70s, will put him on BiPAP.  Hypoxia when sleeping could be related to his OSA, patient had told RT that he was not entirely compliant with his CPAP.  IV team is in the room placing the IV, CT PE study is pending.  On reassessment patient has no wheezing or crackles, he does have some rhonchorous breath sounds bilaterally.  Independent review of labs, respiratory panel is negative, Trop is negative, lactate is negative, his creatinine is mildly elevated but this is downtrending compared to prior, electrolytes are not severely deranged, he has no leukocytosis.  CT imaging without acute PE.  Venous blood gas obtained, CO2 is 87, pH 7.25, asked RT to switch him over to actual BiPAP on the ventilator.  Consult to hospitalist for admission, they wanted pulmonology input.  Discussed with pulmonology who will evaluate the patient.  Rest of clinical course  as below.  .CRITICAL CARE Performed by: Claybon Jabs   Total critical care time: 50 minutes  Critical care time was exclusive of separately billable procedures and treating other patients.  Critical care was necessary to treat or prevent imminent or life-threatening deterioration.  Critical care was time spent personally by me on the following activities: development of treatment plan with patient and/or surrogate as well as nursing, discussions with consultants, evaluation of patient's response to treatment, examination of patient, obtaining history from patient or surrogate, ordering and performing treatments and interventions, ordering and review of laboratory studies, ordering and review of radiographic studies, pulse oximetry and re-evaluation of patient's condition.     Clinical Course as of 05/14/23 1528  Tue May 14, 2023  1037 CT Angio Chest PE W/Cm &/Or Wo Cm IMPRESSION: 1. No evidence acute pulmonary embolism. Suboptimal exam due to body habitus and patient motion. 2. Bibasilar atelectasis.  No pulmonary infarction or pneumonia.   [TT]  [id number] Hospitalist consulted for admission, asking for stool studies to be sent as well as pulmonary input about patient's condition.  Ordered stool studies.  Patient pulmonary.  Hospitalist said that it would take a while before she will be able to coming back or take signout. [TT]  1156 Pulmonology was consulted and will evaluate the patient. [TT]  1326 Pulmonology evaluated patient, states that they have him back on his home BiPAP and is rechecking his blood work, his mental status is at his baseline.  Hospitalist is going to follow-up on his VBG.  They also put in a  TOC consult. [TT]  1512 On recheck earlier patient had desatted to the 70s, came up to the low 90s when he was woken up.  Still pending repeat VBG and pulmonology reevaluation. [TT]  1527 Had reiterated multiple times to hospitalist team that patient despite being on his home BiPAP  with desat, is on 40% FiO2 which is not normal for him.  Repeat VBG is unchanged.  Spoke to pulmonology who recommended admission given that this is not his baseline.  Hospitalist going to put in orders for admission. [TT]    Clinical Course User Index [TT] Claybon Jabs, MD      Claybon Jabs, MD 05/14/23 [phone]

## 2023-05-14 NOTE — Consult Note (Signed)
Consult placed for PIV  No IV meds ordered at this time. Secure chat sent to care RN. New consult can be placed if pt status changes.  Consult complete.

## 2023-05-14 NOTE — ED Notes (Signed)
Lab at bedside to draw VBG

## 2023-05-14 NOTE — ED Provider Notes (Addendum)
[facility] Provider Note    None    (approximate)   History   Cough and Weakness   HPI  Gary [name] is a 68 y.o. male   Past medical history of multiple medical comorbidities including chronic hypoxemic respiratory failure with trach on 3 L nasal cannula O2, COPD, elevated BMI, heart failure, hyperlipidemia, prior DVT on anticoagulation, who presents to the emergency department with generalized weakness, fatigue, diarrhea, cough and productive sputum for the last 1 week.  Subjective fevers and chills.  He has mild headache as well.  He lives alone at home.  He denies urinary symptoms.  He denies trauma.  Independent Historian contributed to assessment above: EMS reports the above, incontinent of yellow stool en route, and initially hypoxemic to the mid 80s but got better on nonrebreather mask and with suctioning  External Medical Documents Reviewed: Hospitalization with discharge in early January where he was found to be anemic due to GI losses, held Eliquis, started Protonix, IV iron infusion, did not require blood transfusion and hemoglobin improved.      Physical Exam   Triage Vital Signs: ED Triage Vitals  Encounter Vitals Group     BP 05/14/23 0133 112/71     Systolic BP Percentile --      Diastolic BP Percentile --      Pulse Rate 05/14/23 0133 (!) 103     Resp --      Temp 05/14/23 0133 97.9 F (36.6 C)     Temp Source 05/14/23 0133 Oral     SpO2 05/14/23 0100 92 %     Weight 05/14/23 0136 (!) 440 lb (199.6 kg)     Height 05/14/23 0136 6\' 4"  (1.93 m)     Head Circumference --      Peak Flow --      Pain Score 05/14/23 0135 7     Pain Loc --      Pain Education --      Exclude from Growth Chart --     Most recent vital signs: Vitals:   05/14/23 0250 05/14/23 0400  BP:  118/76  Pulse:  (!) 102  Resp:  (!) 27  Temp:    SpO2: 97% 98%    General: Awake, no distress.  CV:  Good peripheral perfusion.  Resp:  Normal effort.   Abd:  No distention.  Other:  Chronically ill-appearing gentleman with tracheostomy in place, speaking full sentences with distant sounding lung sounds bilaterally without focality or wheezing.  Nontender abdomen.  Incontinent of yellow stool.  No frank blood or melena.  He is hypoxemic to the mid 80s requiring nonrebreather placement.   ED Results / Procedures / Treatments   Labs (all labs ordered are listed, but only abnormal results are displayed) Labs Reviewed  CBC WITH DIFFERENTIAL/PLATELET - Abnormal; Notable for the following components:      Result Value   Hemoglobin 9.8 (*)    HCT 38.7 (*)    MCV 78.3 (*)    MCH 19.8 (*)    MCHC 25.3 (*)    RDW 19.9 (*)    nRBC 0.4 (*)    Lymphs Abs 0.5 (*)    All other components within normal limits  COMPREHENSIVE METABOLIC PANEL - Abnormal; Notable for the following components:   Chloride 95 (*)    CO2 33 (*)    Glucose, Bld 105 (*)    Creatinine, Ser 1.36 (*)    Calcium 8.0 (*)  Total Protein 8.8 (*)    Albumin 2.9 (*)    AST 13 (*)    GFR, Estimated 58 (*)    All other components within normal limits  CULTURE, BLOOD (ROUTINE X 2)  RESP PANEL BY RT-PCR (RSV, FLU A&B, COVID)  [id number]  CULTURE, BLOOD (ROUTINE X 2)  RESPIRATORY PANEL BY PCR  LACTIC ACID, PLASMA  LACTIC ACID, PLASMA  BRAIN NATRIURETIC PEPTIDE  TROPONIN I (HIGH SENSITIVITY)  TROPONIN I (HIGH SENSITIVITY)    PROCEDURES:  Critical Care performed: Yes, see critical care procedure note(s)  .Critical Care  Performed by: Pilar Jarvis, MD Authorized by: Pilar Jarvis, MD   Critical care provider statement:    Critical care time (minutes):  30   Critical care was time spent personally by me on the following activities:  Development of treatment plan with patient or surrogate, discussions with consultants, evaluation of patient's response to treatment, examination of patient, ordering and review of laboratory studies, ordering and review of radiographic studies,  ordering and performing treatments and interventions, pulse oximetry, re-evaluation of patient's condition and review of old charts    MEDICATIONS ORDERED IN ED: Medications  ipratropium-albuterol (DUONEB) 0.5-2.5 (3) MG/3ML nebulizer solution 6 mL (has no administration in time range)  methylPREDNISolone sodium succinate (SOLU-MEDROL) 125 mg/2 mL injection 125 mg (has no administration in time range)     IMPRESSION / MDM / ASSESSMENT AND PLAN / ED COURSE  I reviewed the triage vital signs and the nursing notes.                                Patient's presentation is most consistent with acute presentation with potential threat to life or bodily function.  Differential diagnosis includes, but is not limited to, respiratory infection, GI infection, infectious diarrhea, electrolyte disturbance, anemia, dehydration     The patient is on the cardiac monitor to evaluate for evidence of arrhythmia and/or significant heart rate changes.  MDM:    This is a patient with significant medical comorbidities here with generalized weakness in the setting of productive cough and diarrheal illness over the last 1 week.  He has an increase in his oxygen requirement today, mild tachycardia otherwise vital signs are okay.  Broad differential diagnosis including respiratory infection, viral gastroenteritis, intra-abdominal infection, electrolyte disturbances and so viral test, blood testing, chest x-ray were sent.  Results thus far unremarkable as BNP is normal, serial troponins been flat, no marked leukocytosis, and other cell counts electrolytes pretty much at baseline.  Chest x-ray without focal opacities or overt pulmonary edema and viral testing thus far has been negative.  I do suspect viral illness as she has had productive cough, as well as diarrheal illness, and noted COPD exacerbation with productive cough and sputum distant lung sounds and increased oxygen requirement.  On further review of  hospitalization note from earlier this month, however it seems that his Eliquis was discontinued upon discharge due to concerns for acute blood loss anemia.  Will obtain CT angiogram of the chest to rule out PE.   FINAL CLINICAL IMPRESSION(S) / ED DIAGNOSES   Final diagnoses:  Acute cough  Generalized weakness  Diarrhea, unspecified type  Acute hypoxemic respiratory failure ([name])     Rx / DC Orders   ED Discharge Orders     None        Note:  This document was prepared using Dragon voice recognition software and may include  unintentional dictation errors.     Pilar Jarvis, MD 05/14/23 1610    Pilar Jarvis, MD 05/14/23 [phone]
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
## 2023-05-14 NOTE — TOC CM/SW Note (Signed)
Transition of Care [name]) - Inpatient Brief Assessment   Patient Details  Name: Gary [name] MRN: [id number] Date of Birth: Jul 30, 1955  [facility] CM/SW Contact:    Margarito Liner, LCSW Phone Number: 05/14/2023, 2:56 PM   Clinical Narrative: Per chart review, patient was set up with [facility] when he discharged earlier this month. CSW called liaison who stated they were unable to start services because he has not seen his PCP in a year.  Transition of Care Asessment: Insurance and Status: Insurance coverage has been reviewed Patient has primary care physician: Yes Home environment has been reviewed: Single family home   Prior/Current Home Services: No current home services Social Drivers of Health Review: SDOH reviewed no interventions necessary Readmission risk has been reviewed: Yes Transition of care needs: no transition of care needs at this time

## 2023-05-14 NOTE — ED Notes (Signed)
RN attempting VBG without success from IV

## 2023-05-14 NOTE — ED Notes (Signed)
Pt palced on NRB

## 2023-05-14 NOTE — ED Triage Notes (Signed)
Pt brought in by EMS from home for complaints of cough and weakness for the "past couple days". Pt has chronic trach in place, sats 85% on 3L [place] - this RN performed trach suctioning, with improvement to sats 89-90%. Pt tachypenic, shallow breathing. Pt is incontinent of stool. A/Ox4, answering questions appropriately. Ill appearing and weak.

## 2023-05-14 NOTE — ED Notes (Signed)
Pt transported to CT with this RN and RT
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
## 2023-05-14 NOTE — ED Notes (Signed)
CT called to transport pt fro scan

## 2023-05-14 NOTE — ED Notes (Signed)
Pt sats 77% and maintained. RT called to place pt on BiPAP. IV still at bedside. Pt placed on 15L

## 2023-05-15 DIAGNOSIS — G894 Chronic pain syndrome: Secondary | ICD-10-CM | POA: Diagnosis not present

## 2023-05-15 DIAGNOSIS — D62 Acute posthemorrhagic anemia: Secondary | ICD-10-CM | POA: Diagnosis not present

## 2023-05-15 DIAGNOSIS — R54 Age-related physical debility: Secondary | ICD-10-CM | POA: Diagnosis not present

## 2023-05-15 DIAGNOSIS — E876 Hypokalemia: Secondary | ICD-10-CM | POA: Diagnosis present

## 2023-05-15 DIAGNOSIS — N179 Acute kidney failure, unspecified: Secondary | ICD-10-CM | POA: Diagnosis present

## 2023-05-15 DIAGNOSIS — K921 Melena: Secondary | ICD-10-CM | POA: Diagnosis not present

## 2023-05-15 DIAGNOSIS — K625 Hemorrhage of anus and rectum: Secondary | ICD-10-CM | POA: Diagnosis not present

## 2023-05-15 DIAGNOSIS — E785 Hyperlipidemia, unspecified: Secondary | ICD-10-CM | POA: Diagnosis present

## 2023-05-15 DIAGNOSIS — F419 Anxiety disorder, unspecified: Secondary | ICD-10-CM | POA: Diagnosis not present

## 2023-05-15 DIAGNOSIS — Z79899 Other long term (current) drug therapy: Secondary | ICD-10-CM | POA: Diagnosis not present

## 2023-05-15 DIAGNOSIS — Z9981 Dependence on supplemental oxygen: Secondary | ICD-10-CM | POA: Diagnosis not present

## 2023-05-15 DIAGNOSIS — I130 Hypertensive heart and chronic kidney disease with heart failure and stage 1 through stage 4 chronic kidney disease, or unspecified chronic kidney disease: Secondary | ICD-10-CM | POA: Diagnosis present

## 2023-05-15 DIAGNOSIS — Z7901 Long term (current) use of anticoagulants: Secondary | ICD-10-CM | POA: Diagnosis not present

## 2023-05-15 DIAGNOSIS — Z8616 Personal history of COVID-19: Secondary | ICD-10-CM | POA: Diagnosis not present

## 2023-05-15 DIAGNOSIS — G4733 Obstructive sleep apnea (adult) (pediatric): Secondary | ICD-10-CM | POA: Diagnosis present

## 2023-05-15 DIAGNOSIS — I5033 Acute on chronic diastolic (congestive) heart failure: Secondary | ICD-10-CM | POA: Diagnosis present

## 2023-05-15 DIAGNOSIS — R531 Weakness: Secondary | ICD-10-CM | POA: Diagnosis not present

## 2023-05-15 DIAGNOSIS — Z1152 Encounter for screening for COVID-19: Secondary | ICD-10-CM | POA: Diagnosis not present

## 2023-05-15 DIAGNOSIS — J9611 Chronic respiratory failure with hypoxia: Secondary | ICD-10-CM | POA: Diagnosis not present

## 2023-05-15 DIAGNOSIS — Z9989 Dependence on other enabling machines and devices: Secondary | ICD-10-CM | POA: Diagnosis not present

## 2023-05-15 DIAGNOSIS — J9622 Acute and chronic respiratory failure with hypercapnia: Secondary | ICD-10-CM | POA: Diagnosis present

## 2023-05-15 DIAGNOSIS — J441 Chronic obstructive pulmonary disease with (acute) exacerbation: Secondary | ICD-10-CM | POA: Diagnosis present

## 2023-05-15 DIAGNOSIS — J9811 Atelectasis: Secondary | ICD-10-CM | POA: Diagnosis present

## 2023-05-15 DIAGNOSIS — A0811 Acute gastroenteropathy due to Norwalk agent: Secondary | ICD-10-CM | POA: Diagnosis present

## 2023-05-15 DIAGNOSIS — Z6841 Body Mass Index (BMI) 40.0 and over, adult: Secondary | ICD-10-CM | POA: Diagnosis not present

## 2023-05-15 DIAGNOSIS — N1831 Chronic kidney disease, stage 3a: Secondary | ICD-10-CM | POA: Diagnosis not present

## 2023-05-15 DIAGNOSIS — D631 Anemia in chronic kidney disease: Secondary | ICD-10-CM | POA: Diagnosis not present

## 2023-05-15 DIAGNOSIS — Z930 Tracheostomy status: Secondary | ICD-10-CM | POA: Diagnosis not present

## 2023-05-15 DIAGNOSIS — I714 Abdominal aortic aneurysm, without rupture, unspecified: Secondary | ICD-10-CM | POA: Diagnosis not present

## 2023-05-15 DIAGNOSIS — J9621 Acute and chronic respiratory failure with hypoxia: Secondary | ICD-10-CM | POA: Diagnosis not present

## 2023-05-15 DIAGNOSIS — E873 Alkalosis: Secondary | ICD-10-CM | POA: Diagnosis not present

## 2023-05-15 DIAGNOSIS — Z79891 Long term (current) use of opiate analgesic: Secondary | ICD-10-CM | POA: Diagnosis not present

## 2023-05-15 LAB — BASIC METABOLIC PANEL
Anion gap: 11 (ref 5–15)
BUN: 32 mg/dL — ABNORMAL HIGH (ref 8–23)
CO2: 30 mmol/L (ref 22–32)
Calcium: 7.8 mg/dL — ABNORMAL LOW (ref 8.9–10.3)
Chloride: 97 mmol/L — ABNORMAL LOW (ref 98–111)
Creatinine, Ser: 1.51 mg/dL — ABNORMAL HIGH (ref 0.61–1.24)
GFR, Estimated: 51 mL/min — ABNORMAL LOW (ref >60)
Glucose, Bld: 104 mg/dL — ABNORMAL HIGH (ref 70–99)
Potassium: 3.1 mmol/L — ABNORMAL LOW (ref 3.5–5.1)
Sodium: 138 mmol/L (ref 135–145)

## 2023-05-15 LAB — C DIFFICILE QUICK SCREEN W PCR REFLEX
C Diff antigen: NEGATIVE
C Diff interpretation: NOT DETECTED
C Diff toxin: NEGATIVE

## 2023-05-15 LAB — GASTROINTESTINAL PANEL BY PCR, STOOL (REPLACES STOOL CULTURE)

## 2023-05-15 LAB — MAGNESIUM: Magnesium: 1.8 mg/dL (ref 1.7–2.4)

## 2023-05-15 LAB — C-REACTIVE PROTEIN: CRP: 12.4 mg/dL — ABNORMAL HIGH (ref <1.0)

## 2023-05-15 MED ORDER — POTASSIUM CHLORIDE CRYS ER 20 MEQ PO TBCR
40.0000 meq | EXTENDED_RELEASE_TABLET | Freq: Once | ORAL | Status: AC
  Administered 2023-05-15: 40 meq via ORAL
  Filled 2023-05-15: qty 2

## 2023-05-15 MED ORDER — PREDNISONE 50 MG PO TABS
50.0000 mg | ORAL_TABLET | Freq: Every day | ORAL | Status: DC
  Administered 2023-05-16 – 2023-05-18 (×3): 50 mg via ORAL
  Filled 2023-05-15 (×3): qty 1

## 2023-05-15 NOTE — ED Notes (Signed)
Patient stated he was not wearing his bipap right now he needed a break,  put patient back on Humidified high flow oxygen

## 2023-05-15 NOTE — TOC Progression Note (Signed)
Transition of Care [facility]) - Progression Note    Patient Details  Name: Gary [name] MRN: [id number] Date of Birth: [birth year]  Transition of [facility]) CM/SW Contact  Tory Emerald, [place] Phone Number: 05/15/2023, 4:24 PM  Clinical Narrative:     CSW contacted pt to discuss PCP and HHS. Pt reports he has an agency coming out to his home on Monday to see him for primary care. He reports this agency does something similar to doctors making house calls. CSW informed pt he will need to have a PCP in order for [facility] to be established. Pt would like for a doctor here to cover his orders. CSW explained this would not work long term to cover home health services. CSW asked pt to find out the name of the agency. He asked CSW to call him back tomorrow.        Expected Discharge Plan and Services                                               Social Determinants of Health (SDOH) Interventions SDOH Screenings   Food Insecurity: No Food Insecurity (12/01/2022)  Housing: Patient Declined (12/01/2022)  Transportation Needs: No Transportation Needs (12/01/2022)  Utilities: Not At Risk (12/01/2022)  Depression (PHQ2-9): Low Risk  (06/07/2021)  Financial Resource Strain: High Risk (11/21/2022)   Received from [facility]  Physical Activity: Insufficiently Active (05/03/2017)  Social Connections: Patient Declined (11/20/2022)   Received from [facility]  Stress: Stress Concern Present (11/20/2022)   Received from [facility]  Tobacco Use: Medium Risk (05/14/2023)    Readmission Risk Interventions    03/19/2023    9:58 AM 11/05/2022    2:59 PM 11/05/2022   12:16 PM  Readmission Risk Prevention Plan  Transportation Screening Complete Complete Complete  Medication Review Oceanographer) Complete Complete Complete  PCP or Specialist appointment within 3-5 days of discharge Complete Complete Complete  HRI or Home Care Consult Complete Complete Complete  SW Recovery Care/Counseling  Consult Complete Complete Complete  Palliative Care Screening Complete Complete Complete  Skilled Nursing Facility Not Applicable Not Applicable Not Applicable

## 2023-05-15 NOTE — ED Notes (Signed)
Pt called out at this time. Pt needing bed pan. Pt placed on bed pan. Loose BM noted. Pt cleaned. Chux pads replaced.
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
## 2023-05-15 NOTE — Progress Notes (Signed)
PT Cancellation Note  Patient Details Name: Gary [name] MRN: [id number] DOB: [birth year]   Cancelled Treatment:    Reason Eval/Treat Not Completed: Other (comment) Chart reviewed, attempted to see with OT.  Pt with multiple reasons he does not want to work with PT, citing: diarrhea, not having his shoes and waiting on respiratory.  Despite encouragement pt not interested in work with PT to day but assures [place] he plans to participate tomorrow.  [name], DPT 05/15/2023, 1:16 PM

## 2023-05-15 NOTE — Progress Notes (Addendum)
Progress Note    Gary [name]  HQI:[id number] DOB: [birth year]  DOA: 05/14/2023 PCP: Shayne Alken, MD      Brief Narrative:    Medical records reviewed and are as summarized below:  Gary [name] is a 68 y.o. 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  EMS found him hypoxic with oxygen saturation in the 80s which improved with oxygenation via nonrebreather mask and trach suctioning en route to the ED. Of note, pt was discharged home with home health services on 04/22/2023 after being admitted on 11/28/2022 (a nearly 5 month admission).  Pt is well known to this hospital with 14 admissions in the past two years alone, often spending months in hospital while medically stable due to placement difficulties and/or pt refusal for placement.  Pt was discharged home with home health services last time.   He required BiPAP in the ED for acute hypoxemic respiratory failure.        Assessment/Plan:   Principal Problem:   Acute on chronic respiratory failure with hypoxia and hypercapnia ([name])    Body mass index is 53.56 kg/m.  (Morbid obesity)    Acute on chronic respiratory failure with hypoxia and hypercapnea  --he is off of BiPAP.  He was on 10 L/min oxygen this morning.  He has been tapered down to 6 L/min oxygen.  He said he uses 2 L at home. Use BiPAP at night. S/p tracheostomy status.  Janina Mayo is capped. Follow-up with pulmonologist   Severe COPD, OSA Probable COPD exacerbation.  Continue steroids and bronchodilators.  Use BiPAP at night. CTA negative for PE or pneumonia or pulmonary edema.    Acute on chronic diastolic CHF (congestive heart failure) ([name]) S/p treatment with IV  Lasix.  Continue torsemide and spironolactone   Norovirus diarrhea:  Improving    Hypokalemia Replete potassium and monitor levels.  Check magnesium and replete as needed.   CKD stage IIIa. Creatinine is stable.    Anxiety --Resume home Lexapro, Atarax PRN          Diet Order             Diet Heart Room service appropriate? Yes; Fluid consistency: Thin  Diet effective now                            Consultants: Pulmonologist  Procedures: None    Medications:    amphetamine-dextroamphetamine  30 mg Oral Q breakfast   enoxaparin (LOVENOX) injection  100 mg Subcutaneous Q24H   escitalopram  10 mg Oral Daily   mesalamine  1.2 g Oral BID   pantoprazole  40 mg Oral QHS   potassium chloride  40 mEq Oral Once   [START ON 05/16/2023] predniSONE  50 mg Oral Q breakfast   spironolactone  25 mg Oral Daily   torsemide  40 mg Oral BID   Continuous Infusions:     Anti-infectives (From admission, onward)    Start     Dose/Rate Route Frequency Ordered Stop   05/14/23 1630  levofloxacin (LEVAQUIN) IVPB 750 mg  Status:  Discontinued        750 mg 100 mL/hr over 90 Minutes Intravenous Every  24 hours 05/14/23 1623 05/15/23 0919              Family Communication/Anticipated D/C date and plan/Code Status   DVT prophylaxis:      Code Status: Full Code  Family Communication: None Disposition Plan: Plan to discharge home   Status is: Inpatient Remains inpatient appropriate because: Acute respiratory failure         Subjective:   Interval events noted.  He feels better.  No shortness of breath, chest pain vomiting, or diarrhea.  He is on 10 L of oxygen.  Objective:    Vitals:   05/15/23 0915 05/15/23 0945 05/15/23 1400 05/15/23 1415  BP:  122/79 107/78   Pulse: 87 88 63   Resp: (!) 21 14 (!) 27 15  Temp:      TempSrc:      SpO2: 94% 97% 94% 94%  Weight:      Height:       No data found.  No intake or output data in  the 24 hours ending 05/15/23 1612 Filed Weights   05/14/23 0136  Weight: (!) 199.6 kg    Exam:  GEN: NAD SKIN: Warm and dry EYES: No pallor or icterus ENT: MMM, capped trach CV: RRR PULM: CTA B ABD: soft, obese, NT, +BS CNS: AAO x 3, non focal EXT: Trace b/l leg edema, no tenderness       Data Reviewed:   I have personally reviewed following labs and imaging studies:  Labs: Labs show the following:   Basic Metabolic Panel: Recent Labs  Lab 05/14/23 0129 05/15/23 0517  NA 137 138  K 3.5 3.1*  CL 95* 97*  CO2 33* 30  GLUCOSE 105* 104*  BUN 22 32*  CREATININE 1.36* 1.51*  CALCIUM 8.0* 7.8*   GFR Estimated Creatinine Clearance: 91 mL/min (A) (by C-G formula based on SCr of 1.51 mg/dL (H)). Liver Function Tests: Recent Labs  Lab 05/14/23 0129  AST 13*  ALT 11  ALKPHOS 92  BILITOT 0.9  PROT 8.8*  ALBUMIN 2.9*   No results for input(s): "LIPASE", "AMYLASE" in the last 168 hours. No results for input(s): "AMMONIA" in the last 168 hours. Coagulation profile No results for input(s): "INR", "PROTIME" in the last 168 hours.  CBC: Recent Labs  Lab 05/14/23 0129  WBC 7.5  NEUTROABS 6.4  HGB 9.8*  HCT 38.7*  MCV 78.3*  PLT 205   Cardiac Enzymes: No results for input(s): "CKTOTAL", "CKMB", "CKMBINDEX", "TROPONINI" in the last 168 hours. BNP (last 3 results) No results for input(s): "PROBNP" in the last 8760 hours. CBG: No results for input(s): "GLUCAP" in the last 168 hours. D-Dimer: No results for input(s): "DDIMER" in the last 72 hours. Hgb A1c: No results for input(s): "HGBA1C" in the last 72 hours. Lipid Profile: No results for input(s): "CHOL", "HDL", "LDLCALC", "TRIG", "CHOLHDL", "LDLDIRECT" in the last 72 hours. Thyroid function studies: No results for input(s): "TSH", "T4TOTAL", "T3FREE", "THYROIDAB" in the last 72 hours.  Invalid input(s): "FREET3" Anemia work up: No results for input(s): "VITAMINB12", "FOLATE", "FERRITIN", "TIBC",  "IRON", "RETICCTPCT" in the last 72 hours. Sepsis Labs: Recent Labs  Lab 05/14/23 0129 05/14/23 0308  WBC 7.5  --   LATICACIDVEN 0.9 0.8    Microbiology Recent Results (from the past 240 hours)  Culture, blood (routine x 2)     Status: None (Preliminary result)   Collection Time: 05/14/23  1:29 AM   Specimen: BLOOD  Result Value Ref Range Status  Specimen Description BLOOD  Final   Special Requests   Final    BOTTLES DRAWN AEROBIC AND ANAEROBIC Blood Culture adequate volume   Culture   Final    NO GROWTH 1 DAY Performed at [facility], [address], [place], [place] [address]    Report Status PENDING  Incomplete  Culture, blood (routine x 2)     Status: None (Preliminary result)   Collection Time: 05/14/23  3:08 AM   Specimen: BLOOD  Result Value Ref Range Status   Specimen Description BLOOD RIGHT AC  Final   Special Requests   Final    BOTTLES DRAWN AEROBIC AND ANAEROBIC Blood Culture results may not be optimal due to an inadequate volume of blood received in culture bottles   Culture   Final    NO GROWTH 1 DAY Performed at [facility], [address], [place], [place] [address]    Report Status PENDING  Incomplete  Resp panel by RT-PCR (RSV, Flu A&B, Covid) Anterior Nasal Swab     Status: None   Collection Time: 05/14/23  3:08 AM   Specimen: Anterior Nasal Swab  Result Value Ref Range Status   SARS Coronavirus 2 by RT PCR NEGATIVE NEGATIVE Final    Comment: (NOTE) SARS-CoV-2 target nucleic acids are NOT DETECTED.  The SARS-CoV-2 RNA is generally detectable in upper respiratory specimens during the acute phase of infection. The lowest concentration of SARS-CoV-2 viral copies this assay can detect is 138 copies/mL. A negative result does not preclude SARS-Cov-2 infection and should not be used as the sole basis for treatment or other patient management decisions. A negative result may occur with  improper specimen collection/handling,  submission of specimen other than nasopharyngeal swab, presence of viral mutation(s) within the areas targeted by this assay, and inadequate number of viral copies(<138 copies/mL). A negative result must be combined with clinical observations, patient history, and epidemiological information. The expected result is Negative.  Fact Sheet for Patients:  BloggerCourse.com  Fact Sheet for Healthcare Providers:  SeriousBroker.it  This test is no t yet approved or cleared by the [place] FDA and  has been authorized for detection and/or diagnosis of SARS-CoV-2 by FDA under an Emergency Use Authorization (EUA). This EUA will remain  in effect (meaning this test can be used) for the duration of the COVID-19 declaration under Section 564(b)(1) of the Act, 21 U.S.C.section 360bbb-3(b)(1), unless the authorization is terminated  or revoked sooner.       Influenza A by PCR NEGATIVE NEGATIVE Final   Influenza B by PCR NEGATIVE NEGATIVE Final    Comment: (NOTE) The Xpert Xpress SARS-CoV-2/FLU/RSV plus assay is intended as an aid in the diagnosis of influenza from Nasopharyngeal swab specimens and should not be used as a sole basis for treatment. Nasal washings and aspirates are unacceptable for Xpert Xpress SARS-CoV-2/FLU/RSV testing.  Fact Sheet for Patients: BloggerCourse.com  Fact Sheet for Healthcare Providers: SeriousBroker.it  This test is not yet approved or cleared by the [place] FDA and has been authorized for detection and/or diagnosis of SARS-CoV-2 by FDA under an Emergency Use Authorization (EUA). This EUA will remain in effect (meaning this test can be used) for the duration of the COVID-19 declaration under Section 564(b)(1) of the Act, 21 U.S.C. section 360bbb-3(b)(1), unless the authorization is terminated or revoked.     Resp Syncytial Virus by PCR NEGATIVE  NEGATIVE Final    Comment: (NOTE) Fact Sheet for Patients: BloggerCourse.com  Fact Sheet for Healthcare  Providers: SeriousBroker.it  This test is not yet approved or cleared by the [place] and has been authorized for detection and/or diagnosis of SARS-CoV-2 by FDA under an Emergency Use Authorization (EUA). This EUA will remain in effect (meaning this test can be used) for the duration of the COVID-19 declaration under Section 564(b)(1) of the Act, 21 U.S.C. section 360bbb-3(b)(1), unless the authorization is terminated or revoked.  Performed at [facility], [address] [place], [place] [address]   Respiratory (~20 pathogens) panel by PCR     Status: None   Collection Time: 05/14/23  6:54 AM   Specimen: Nasopharyngeal Swab; Respiratory  Result Value Ref Range Status   Adenovirus NOT DETECTED NOT DETECTED Final   Coronavirus 229E NOT DETECTED NOT DETECTED Final    Comment: (NOTE) The Coronavirus on the Respiratory Panel, DOES NOT test for the novel  Coronavirus (2019 nCoV)    Coronavirus HKU1 NOT DETECTED NOT DETECTED Final   Coronavirus NL63 NOT DETECTED NOT DETECTED Final   Coronavirus OC43 NOT DETECTED NOT DETECTED Final   Metapneumovirus NOT DETECTED NOT DETECTED Final   Rhinovirus / Enterovirus NOT DETECTED NOT DETECTED Final   Influenza A NOT DETECTED NOT DETECTED Final   Influenza B NOT DETECTED NOT DETECTED Final   Parainfluenza Virus 1 NOT DETECTED NOT DETECTED Final   Parainfluenza Virus 2 NOT DETECTED NOT DETECTED Final   Parainfluenza Virus 3 NOT DETECTED NOT DETECTED Final   Parainfluenza Virus 4 NOT DETECTED NOT DETECTED Final   Respiratory Syncytial Virus NOT DETECTED NOT DETECTED Final   Bordetella pertussis NOT DETECTED NOT DETECTED Final   Bordetella Parapertussis NOT DETECTED NOT DETECTED Final   Chlamydophila pneumoniae NOT DETECTED NOT DETECTED Final   Mycoplasma pneumoniae NOT  DETECTED NOT DETECTED Final    Comment: Performed at [facility], [address] [place], [place] [address]  C Difficile Quick Screen w PCR reflex     Status: None   Collection Time: 05/15/23 12:19 AM   Specimen: STOOL  Result Value Ref Range Status   C Diff antigen NEGATIVE NEGATIVE Final   C Diff toxin NEGATIVE NEGATIVE Final   C Diff interpretation No C. difficile detected.  Final    Comment: Performed at [facility], [address] [place], [place] [address]  Gastrointestinal Panel by PCR , Stool     Status: Abnormal   Collection Time: 05/15/23 12:19 AM   Specimen: STOOL  Result Value Ref Range Status   Campylobacter species NOT DETECTED NOT DETECTED Final   Plesimonas shigelloides NOT DETECTED NOT DETECTED Final   Salmonella species NOT DETECTED NOT DETECTED Final   Yersinia enterocolitica NOT DETECTED NOT DETECTED Final   Vibrio species NOT DETECTED NOT DETECTED Final   Vibrio cholerae NOT DETECTED NOT DETECTED Final   Enteroaggregative E coli (EAEC) NOT DETECTED NOT DETECTED Final   Enteropathogenic E coli (EPEC) NOT DETECTED NOT DETECTED Final   Enterotoxigenic E coli (ETEC) NOT DETECTED NOT DETECTED Final   Shiga like toxin producing E coli (STEC) NOT DETECTED NOT DETECTED Final   Shigella/Enteroinvasive E coli (EIEC) NOT DETECTED NOT DETECTED Final   Cryptosporidium NOT DETECTED NOT DETECTED Final   Cyclospora cayetanensis NOT DETECTED NOT DETECTED Final   Entamoeba histolytica NOT DETECTED NOT DETECTED Final   Giardia lamblia NOT DETECTED NOT DETECTED Final   Adenovirus F40/41 NOT DETECTED NOT DETECTED Final   Astrovirus NOT DETECTED NOT DETECTED Final   Norovirus GI/GII DETECTED (A) NOT DETECTED Final  Comment: RESULT CALLED TO, READ BACK BY AND VERIFIED WITH: [email or web address] 05/15/23 2956 MW    Rotavirus A NOT DETECTED NOT DETECTED Final   Sapovirus (I, II, IV, and V) NOT DETECTED NOT DETECTED Final    Comment: Performed at [facility]  [address] [place], [place] [address]    Procedures and diagnostic studies:  CT Angio Chest PE W/Cm &/Or Wo Cm Result Date: 05/14/2023 CLINICAL DATA:  Pulmonary embolism suspected.  High probability EXAM: CT ANGIOGRAPHY CHEST WITH CONTRAST TECHNIQUE: Multidetector CT imaging of the chest was performed using the standard protocol during bolus administration of intravenous contrast. Multiplanar CT image reconstructions and MIPs were obtained to evaluate the vascular anatomy. RADIATION DOSE REDUCTION: This exam was performed according to the departmental dose-optimization program which includes automated exposure control, adjustment of the mA and/or kV according to patient size and/or use of iterative reconstruction technique. CONTRAST:  75mL OMNIPAQUE IOHEXOL 350 MG/ML SOLN COMPARISON:  None Available. FINDINGS: Cardiovascular: Images degraded by body motion and body habitus. With this caveat, no filling defects within pulmonary arteries to suggest acute pulmonary embolism. Segmental pulmonary arteries are poorly evaluated. Mediastinum/Nodes: No axillary or supraclavicular adenopathy. No mediastinal or hilar adenopathy. No pericardial fluid. Esophagus normal. Tracheostomy tube in good position. Lungs/Pleura: No pulmonary infarction. No pneumonia. No pleural fluid. No pneumothorax Bibasilar atelectasis. Upper Abdomen: Limited view of the liver, kidneys, pancreas are unremarkable. Normal adrenal glands. Musculoskeletal: Healed posterior LEFT rib fractures. No acute findings Review of the MIP images confirms the above findings. IMPRESSION: 1. No evidence acute pulmonary embolism. Suboptimal exam due to body habitus and patient motion. 2. Bibasilar atelectasis.  No pulmonary infarction or pneumonia. Electronically Signed   By: Genevive Bi M.D.   On: 05/14/2023 10:32   DG Chest Port 1 View Result Date: 05/14/2023 CLINICAL DATA:  Patient with SOB, cough, and weakness. Hx of COPD, tracheostomy, and former  smoker. EXAM: PORTABLE CHEST 1 VIEW COMPARISON:  Chest x-ray 03/26/2023, CT abdomen pelvis 09/07/2022, chest x-ray 02/17/2023 FINDINGS: Tracheostomy terminates 4 cm above the carina. Persistent cardiomegaly. The heart and mediastinal contours are unchanged. Prominent hilar vasculature. Low lung volumes. Left mid lung zone linear atelectasis versus airspace opacity. No focal consolidation. No pulmonary edema. No pleural effusion. No pneumothorax. No acute osseous abnormality. IMPRESSION: 1. Low lung volumes with venous pulmonary congestion. 2. Cardiomegaly with underlying pericardial effusion not excluded. Electronically Signed   By: Tish Frederickson M.D.   On: 05/14/2023 02:37               LOS: 0 days   Kelani Robart  [facility]   Pager on [email or web address]. If 7PM-7AM, please contact night-coverage at [email or web address]     05/15/2023, 4:12 PM

## 2023-05-15 NOTE — Progress Notes (Signed)
OT Cancellation Note  Patient Details Name: Gary [name] MRN: [id number] DOB: May 21, 1955   Cancelled Treatment:    Reason Eval/Treat Not Completed: Fatigue/lethargy limiting ability to participate. Chart reviewed. Pt defers this date citing diarrhea and fatigue. Will re-attempt next date as able.  Kathie Dike, M.S. OTR/L  05/15/23, 1:34 PM  ascom [phone]

## 2023-05-15 NOTE — ED Notes (Signed)
Patient placed on Bipap for the night at the request of the patient.

## 2023-05-15 NOTE — ED Notes (Signed)
Pt calling out for the bedpan at this time. Pt placed on bedpan. Pt has runny BM. Pt cleaned at this time and repositioned in bed.
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
## 2023-05-16 DIAGNOSIS — K921 Melena: Secondary | ICD-10-CM | POA: Diagnosis not present

## 2023-05-16 DIAGNOSIS — J9621 Acute and chronic respiratory failure with hypoxia: Secondary | ICD-10-CM | POA: Diagnosis not present

## 2023-05-16 DIAGNOSIS — J9622 Acute and chronic respiratory failure with hypercapnia: Secondary | ICD-10-CM | POA: Diagnosis not present

## 2023-05-16 DIAGNOSIS — K625 Hemorrhage of anus and rectum: Secondary | ICD-10-CM

## 2023-05-16 LAB — C-REACTIVE PROTEIN: CRP: 6.1 mg/dL — ABNORMAL HIGH (ref <1.0)

## 2023-05-16 LAB — CBC WITH DIFFERENTIAL/PLATELET
Abs Immature Granulocytes: 0.01 10*3/uL (ref 0.00–0.07)
Basophils Absolute: 0 10*3/uL (ref 0.0–0.1)
Basophils Relative: 0 %
Eosinophils Absolute: 0 10*3/uL (ref 0.0–0.5)
Eosinophils Relative: 0 %
HCT: 32.6 % — ABNORMAL LOW (ref 39.0–52.0)
Hemoglobin: 8.7 g/dL — ABNORMAL LOW (ref 13.0–17.0)
Immature Granulocytes: 0 %
Lymphocytes Relative: 12 %
Lymphs Abs: 0.6 10*3/uL — ABNORMAL LOW (ref 0.7–4.0)
MCH: 20.1 pg — ABNORMAL LOW (ref 26.0–34.0)
MCHC: 26.7 g/dL — ABNORMAL LOW (ref 30.0–36.0)
MCV: 75.3 fL — ABNORMAL LOW (ref 80.0–100.0)
Monocytes Absolute: 0.3 10*3/uL (ref 0.1–1.0)
Monocytes Relative: 6 %
Neutro Abs: 4.5 10*3/uL (ref 1.7–7.7)
Neutrophils Relative %: 82 %
Platelets: 195 10*3/uL (ref 150–400)
RBC: 4.33 MIL/uL (ref 4.22–5.81)
RDW: 19.3 % — ABNORMAL HIGH (ref 11.5–15.5)
WBC: 5.5 10*3/uL (ref 4.0–10.5)
nRBC: 0 % (ref 0.0–0.2)

## 2023-05-16 LAB — TYPE AND SCREEN
ABO/RH(D): A NEG
Antibody Screen: NEGATIVE

## 2023-05-16 LAB — BASIC METABOLIC PANEL
Anion gap: 7 (ref 5–15)
BUN: 38 mg/dL — ABNORMAL HIGH (ref 8–23)
CO2: 33 mmol/L — ABNORMAL HIGH (ref 22–32)
Calcium: 7.7 mg/dL — ABNORMAL LOW (ref 8.9–10.3)
Chloride: 99 mmol/L (ref 98–111)
Creatinine, Ser: 1.68 mg/dL — ABNORMAL HIGH (ref 0.61–1.24)
GFR, Estimated: 45 mL/min — ABNORMAL LOW (ref >60)
Glucose, Bld: 85 mg/dL (ref 70–99)
Potassium: 3.2 mmol/L — ABNORMAL LOW (ref 3.5–5.1)
Sodium: 139 mmol/L (ref 135–145)

## 2023-05-16 LAB — HEMOGLOBIN AND HEMATOCRIT, BLOOD
HCT: 33.4 % — ABNORMAL LOW (ref 39.0–52.0)
Hemoglobin: 8.8 g/dL — ABNORMAL LOW (ref 13.0–17.0)

## 2023-05-16 MED ORDER — POTASSIUM CHLORIDE CRYS ER 20 MEQ PO TBCR
40.0000 meq | EXTENDED_RELEASE_TABLET | Freq: Every day | ORAL | Status: DC
  Administered 2023-05-16 – 2023-05-20 (×5): 40 meq via ORAL
  Filled 2023-05-16 (×5): qty 2

## 2023-05-16 NOTE — Progress Notes (Signed)
PT Cancellation Note  Patient Details Name: Gary [name] MRN: [id number] DOB: April 06, 1956   Cancelled Treatment:    Reason Eval/Treat Not Completed: Other (comment) Per pt's preferred schedule (checked in the AM to verify) planned to see ~1300 with OT co-treat.  Pt had BM, needing clean up, and not feeling ready to work with PT this afternoon.  Will maintain on caseload and attempt to see when pt is able and willing.    [name], DPT 05/16/2023, 1:14 PM

## 2023-05-16 NOTE — Plan of Care (Signed)

## 2023-05-16 NOTE — Progress Notes (Addendum)
Progress Note    Gary [name]  IHK:[id number] DOB: [birth year]  DOA: 05/14/2023 PCP: Shayne Alken, MD      Brief Narrative:    Gary [name] is a 68 y.o. 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  EMS found him hypoxic with oxygen saturation in the 80s which improved with oxygenation via nonrebreather mask and trach suctioning en route to the ED. Of note, pt was discharged home with home health services on 04/22/2023 after being admitted on 11/28/2022 (a nearly 5 month admission).  Pt is well known to this hospital with 14 admissions in the past two years alone, often spending months in hospital while medically stable due to placement difficulties and/or pt refusal for placement.  Pt was discharged home with home health services last time.   He required BiPAP in the ED for acute hypoxemic respiratory failure.        Assessment/Plan:   Principal Problem:   Acute on chronic respiratory failure with hypoxia and hypercapnia ([name]) Active Problems:   (HFpEF) heart failure with preserved ejection fraction ([name])   Chronic obstructive pulmonary disease (COPD) ([name])   Acute GI bleeding    Body mass index is 47.47 kg/m.  (Morbid obesity)    Acute on chronic respiratory failure with hypoxia and hypercapnea  --he is off of BiPAP.  Oxygen requirement down from 10 L to 4 L/min.  He said he uses 2 L at home. Use BiPAP at night. S/p tracheostomy status.  Gary [name] is capped. Follow-up with pulmonologist   Severe COPD, OSA Probable COPD exacerbation.  Continue steroids and bronchodilators.  Use BiPAP at night. CTA negative for PE or  pneumonia or pulmonary edema.    Acute on chronic diastolic CHF (congestive heart failure) ([name]) S/p treatment with IV Lasix.  Continue torsemide and spironolactone   Norovirus diarrhea Acute GI bleeding Patient had maroon-colored stools this morning.  Discontinue Lovenox prophylaxis.  Consulted Dr. Servando Snare, gastroenterologist.  CBC is pending Prior EGD in October 2024 showed hiatal hernia and Barrett's esophagus. Prior colonoscopy in May 2024 showed "hemorrhoids, diverticuli, random left colon biopsies r/o chronic colitis, hemostasis clip placement at anastomosis site, diverticulum biopsies r/o dysplasia".    Hypokalemia Continue potassium repletion.   CKD stage IIIa. Creatinine is stable.    Anxiety Continue Lexapro, Atarax PRN    History of DVT but no longer on Eliquis.      Diet Order             Diet Heart Room service appropriate? Yes; Fluid consistency: Thin  Diet effective now                            Consultants: Pulmonologist  Procedures: None    Medications:    amphetamine-dextroamphetamine  30 mg Oral Q breakfast   escitalopram  10 mg Oral Daily   mesalamine  1.2 g Oral BID   pantoprazole  40 mg Oral QHS   potassium chloride  40 mEq Oral Daily   predniSONE  50 mg Oral Q breakfast   spironolactone  25 mg Oral Daily   torsemide  40 mg Oral  BID   Continuous Infusions:     Anti-infectives (From admission, onward)    Start     Dose/Rate Route Frequency Ordered Stop   05/14/23 1630  levofloxacin (LEVAQUIN) IVPB 750 mg  Status:  Discontinued        750 mg 100 mL/hr over 90 Minutes Intravenous Every 24 hours 05/14/23 1623 05/15/23 0919              Family Communication/Anticipated D/C date and plan/Code Status   DVT prophylaxis:      Code Status: Full Code  Family Communication: None Disposition Plan: Plan to discharge home   Status is: Inpatient Remains inpatient appropriate because: Acute respiratory  failure         Subjective:   Interval events noted.  He had bloody stools (2 episodes) this morning.  He has mild left-sided abdominal pain.  No vomiting or hematemesis.  No shortness of breath or chest pain.  Objective:    Vitals:   05/16/23 0009 05/16/23 0541 05/16/23 0847 05/16/23 0849  BP:  101/73 108/74   Pulse: 83 89 83 80  Resp:  16 18 18   Temp:  (!) 97.5 F (36.4 C) 97.6 F (36.4 C)   TempSrc:      SpO2: 96% 99% 98% 97%  Weight:      Height:       No data found.   Intake/Output Summary (Last 24 hours) at 05/16/2023 1216 Last data filed at 05/16/2023 0900 Gross per 24 hour  Intake 0 ml  Output 1000 ml  Net -1000 ml   Filed Weights   05/14/23 0136 05/15/23 2212  Weight: (!) 199.6 kg (!) 176.9 kg    Exam:  GEN: NAD SKIN: Warm and dry EYES: No pallor or icterus ENT: MMM, Trach CV: RRR PULM: CTA B ABD: soft, obese, NT, +BS CNS: AAO x 3, non focal EXT: No edema or tenderness       Data Reviewed:   I have personally reviewed following labs and imaging studies:  Labs: Labs show the following:   Basic Metabolic Panel: Recent Labs  Lab 05/14/23 0129 05/15/23 0517 05/16/23 0527  NA 137 138 139  K 3.5 3.1* 3.2*  CL 95* 97* 99  CO2 33* 30 33*  GLUCOSE 105* 104* 85  BUN 22 32* 38*  CREATININE 1.36* 1.51* 1.68*  CALCIUM 8.0* 7.8* 7.7*  MG  --  1.8  --    GFR Estimated Creatinine Clearance: 76.1 mL/min (A) (by C-G formula based on SCr of 1.68 mg/dL (H)). Liver Function Tests: Recent Labs  Lab 05/14/23 0129  AST 13*  ALT 11  ALKPHOS 92  BILITOT 0.9  PROT 8.8*  ALBUMIN 2.9*   No results for input(s): "LIPASE", "AMYLASE" in the last 168 hours. No results for input(s): "AMMONIA" in the last 168 hours. Coagulation profile No results for input(s): "INR", "PROTIME" in the last 168 hours.  CBC: Recent Labs  Lab 05/14/23 0129  WBC 7.5  NEUTROABS 6.4  HGB 9.8*  HCT 38.7*  MCV 78.3*  PLT 205   Cardiac Enzymes: No results for  input(s): "CKTOTAL", "CKMB", "CKMBINDEX", "TROPONINI" in the last 168 hours. BNP (last 3 results) No results for input(s): "PROBNP" in the last 8760 hours. CBG: No results for input(s): "GLUCAP" in the last 168 hours. D-Dimer: No results for input(s): "DDIMER" in the last 72 hours. Hgb A1c: No results for input(s): "HGBA1C" in the last 72 hours. Lipid Profile: No results for input(s): "CHOL", "HDL", "LDLCALC", "TRIG", "  CHOLHDL", "LDLDIRECT" in the last 72 hours. Thyroid function studies: No results for input(s): "TSH", "T4TOTAL", "T3FREE", "THYROIDAB" in the last 72 hours.  Invalid input(s): "FREET3" Anemia work up: No results for input(s): "VITAMINB12", "FOLATE", "FERRITIN", "TIBC", "IRON", "RETICCTPCT" in the last 72 hours. Sepsis Labs: Recent Labs  Lab 05/14/23 0129 05/14/23 0308  WBC 7.5  --   LATICACIDVEN 0.9 0.8    Microbiology Recent Results (from the past 240 hours)  Culture, blood (routine x 2)     Status: None (Preliminary result)   Collection Time: 05/14/23  1:29 AM   Specimen: BLOOD  Result Value Ref Range Status   Specimen Description BLOOD  Final   Special Requests   Final    BOTTLES DRAWN AEROBIC AND ANAEROBIC Blood Culture adequate volume   Culture   Final    NO GROWTH 2 DAYS Performed at [facility], [address], [place], [place] [address]    Report Status PENDING  Incomplete  Culture, blood (routine x 2)     Status: None (Preliminary result)   Collection Time: 05/14/23  3:08 AM   Specimen: BLOOD  Result Value Ref Range Status   Specimen Description BLOOD RIGHT AC  Final   Special Requests   Final    BOTTLES DRAWN AEROBIC AND ANAEROBIC Blood Culture results may not be optimal due to an inadequate volume of blood received in culture bottles   Culture   Final    NO GROWTH 2 DAYS Performed at [facility], [address], [place], [place] [address]    Report Status PENDING  Incomplete  Resp panel by RT-PCR (RSV, Flu A&B, Covid)  Anterior Nasal Swab     Status: None   Collection Time: 05/14/23  3:08 AM   Specimen: Anterior Nasal Swab  Result Value Ref Range Status   SARS Coronavirus 2 by RT PCR NEGATIVE NEGATIVE Final    Comment: (NOTE) SARS-CoV-2 target nucleic acids are NOT DETECTED.  The SARS-CoV-2 RNA is generally detectable in upper respiratory specimens during the acute phase of infection. The lowest concentration of SARS-CoV-2 viral copies this assay can detect is 138 copies/mL. A negative result does not preclude SARS-Cov-2 infection and should not be used as the sole basis for treatment or other patient management decisions. A negative result may occur with  improper specimen collection/handling, submission of specimen other than nasopharyngeal swab, presence of viral mutation(s) within the areas targeted by this assay, and inadequate number of viral copies(<138 copies/mL). A negative result must be combined with clinical observations, patient history, and epidemiological information. The expected result is Negative.  Fact Sheet for Patients:  BloggerCourse.com  Fact Sheet for Healthcare Providers:  SeriousBroker.it  This test is no t yet approved or cleared by the [place] FDA and  has been authorized for detection and/or diagnosis of SARS-CoV-2 by FDA under an Emergency Use Authorization (EUA). This EUA will remain  in effect (meaning this test can be used) for the duration of the COVID-19 declaration under Section 564(b)(1) of the Act, 21 U.S.C.section 360bbb-3(b)(1), unless the authorization is terminated  or revoked sooner.       Influenza A by PCR NEGATIVE NEGATIVE Final   Influenza B by PCR NEGATIVE NEGATIVE Final    Comment: (NOTE) The Xpert Xpress SARS-CoV-2/FLU/RSV plus assay is intended as an aid in the diagnosis of influenza from Nasopharyngeal swab specimens and should not be used as a sole basis for treatment. Nasal washings  and aspirates are unacceptable for Xpert Xpress  SARS-CoV-2/FLU/RSV testing.  Fact Sheet for Patients: BloggerCourse.com  Fact Sheet for Healthcare Providers: SeriousBroker.it  This test is not yet approved or cleared by the [place] FDA and has been authorized for detection and/or diagnosis of SARS-CoV-2 by FDA under an Emergency Use Authorization (EUA). This EUA will remain in effect (meaning this test can be used) for the duration of the COVID-19 declaration under Section 564(b)(1) of the Act, 21 U.S.C. section 360bbb-3(b)(1), unless the authorization is terminated or revoked.     Resp Syncytial Virus by PCR NEGATIVE NEGATIVE Final    Comment: (NOTE) Fact Sheet for Patients: BloggerCourse.com  Fact Sheet for Healthcare Providers: SeriousBroker.it  This test is not yet approved or cleared by the [place] FDA and has been authorized for detection and/or diagnosis of SARS-CoV-2 by FDA under an Emergency Use Authorization (EUA). This EUA will remain in effect (meaning this test can be used) for the duration of the COVID-19 declaration under Section 564(b)(1) of the Act, 21 U.S.C. section 360bbb-3(b)(1), unless the authorization is terminated or revoked.  Performed at [facility], [address] [place], [place] [address]   Respiratory (~20 pathogens) panel by PCR     Status: None   Collection Time: 05/14/23  6:54 AM   Specimen: Nasopharyngeal Swab; Respiratory  Result Value Ref Range Status   Adenovirus NOT DETECTED NOT DETECTED Final   Coronavirus 229E NOT DETECTED NOT DETECTED Final    Comment: (NOTE) The Coronavirus on the Respiratory Panel, DOES NOT test for the novel  Coronavirus (2019 nCoV)    Coronavirus HKU1 NOT DETECTED NOT DETECTED Final   Coronavirus NL63 NOT DETECTED NOT DETECTED Final   Coronavirus OC43 NOT DETECTED NOT DETECTED Final    Metapneumovirus NOT DETECTED NOT DETECTED Final   Rhinovirus / Enterovirus NOT DETECTED NOT DETECTED Final   Influenza A NOT DETECTED NOT DETECTED Final   Influenza B NOT DETECTED NOT DETECTED Final   Parainfluenza Virus 1 NOT DETECTED NOT DETECTED Final   Parainfluenza Virus 2 NOT DETECTED NOT DETECTED Final   Parainfluenza Virus 3 NOT DETECTED NOT DETECTED Final   Parainfluenza Virus 4 NOT DETECTED NOT DETECTED Final   Respiratory Syncytial Virus NOT DETECTED NOT DETECTED Final   Bordetella pertussis NOT DETECTED NOT DETECTED Final   Bordetella Parapertussis NOT DETECTED NOT DETECTED Final   Chlamydophila pneumoniae NOT DETECTED NOT DETECTED Final   Mycoplasma pneumoniae NOT DETECTED NOT DETECTED Final    Comment: Performed at [facility], [address], [place], [place] [address]  C Difficile Quick Screen w PCR reflex     Status: None   Collection Time: 05/15/23 12:19 AM   Specimen: STOOL  Result Value Ref Range Status   C Diff antigen NEGATIVE NEGATIVE Final   C Diff toxin NEGATIVE NEGATIVE Final   C Diff interpretation No C. difficile detected.  Final    Comment: Performed at [facility], [address] [place], [place] [address]  Gastrointestinal Panel by PCR , Stool     Status: Abnormal   Collection Time: 05/15/23 12:19 AM   Specimen: STOOL  Result Value Ref Range Status   Campylobacter species NOT DETECTED NOT DETECTED Final   Plesimonas shigelloides NOT DETECTED NOT DETECTED Final   Salmonella species NOT DETECTED NOT DETECTED Final   Yersinia enterocolitica NOT DETECTED NOT DETECTED Final   Vibrio species NOT DETECTED NOT DETECTED Final   Vibrio cholerae NOT DETECTED NOT DETECTED Final   Enteroaggregative E coli (EAEC) NOT DETECTED NOT  DETECTED Final   Enteropathogenic E coli (EPEC) NOT DETECTED NOT DETECTED Final   Enterotoxigenic E coli (ETEC) NOT DETECTED NOT DETECTED Final   Shiga like toxin producing E coli (STEC) NOT DETECTED NOT DETECTED Final    Shigella/Enteroinvasive E coli (EIEC) NOT DETECTED NOT DETECTED Final   Cryptosporidium NOT DETECTED NOT DETECTED Final   Cyclospora cayetanensis NOT DETECTED NOT DETECTED Final   Entamoeba histolytica NOT DETECTED NOT DETECTED Final   Giardia lamblia NOT DETECTED NOT DETECTED Final   Adenovirus F40/41 NOT DETECTED NOT DETECTED Final   Astrovirus NOT DETECTED NOT DETECTED Final   Norovirus GI/GII DETECTED (A) NOT DETECTED Final    Comment: RESULT CALLED TO, READ BACK BY AND VERIFIED WITH: [email or web address] 05/15/23 2956 MW    Rotavirus A NOT DETECTED NOT DETECTED Final   Sapovirus (I, II, IV, and V) NOT DETECTED NOT DETECTED Final    Comment: Performed at [facility], [address] [place], [place] [address]    Procedures and diagnostic studies:  No results found.              LOS: 1 day   Jerremy Maione  [facility]   Pager on [email or web address]. If 7PM-7AM, please contact night-coverage at [email or web address]     05/16/2023, 12:16 PM
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
## 2023-05-16 NOTE — Progress Notes (Signed)
OT Cancellation Note  Patient Details Name: Gary [name] MRN: [id number] DOB: [birth year]   Cancelled Treatment:    Reason Eval/Treat Not Completed: Fatigue/lethargy limiting ability to participate. Pt reports ongoing diarrhea and concern for intcontinence with mobility attempts. Refuses OOB at this time. Ill re-attempt as able.  Kathie Dike, M.S. OTR/L  05/16/23, 2:15 PM  ascom [phone]

## 2023-05-17 DIAGNOSIS — J9621 Acute and chronic respiratory failure with hypoxia: Secondary | ICD-10-CM | POA: Diagnosis not present

## 2023-05-17 DIAGNOSIS — J9622 Acute and chronic respiratory failure with hypercapnia: Secondary | ICD-10-CM | POA: Diagnosis not present

## 2023-05-17 DIAGNOSIS — K625 Hemorrhage of anus and rectum: Secondary | ICD-10-CM | POA: Diagnosis not present

## 2023-05-17 DIAGNOSIS — R197 Diarrhea, unspecified: Secondary | ICD-10-CM

## 2023-05-17 LAB — BASIC METABOLIC PANEL
Anion gap: 10 (ref 5–15)
BUN: 32 mg/dL — ABNORMAL HIGH (ref 8–23)
CO2: 35 mmol/L — ABNORMAL HIGH (ref 22–32)
Calcium: 7.6 mg/dL — ABNORMAL LOW (ref 8.9–10.3)
Chloride: 99 mmol/L (ref 98–111)
Creatinine, Ser: 1.55 mg/dL — ABNORMAL HIGH (ref 0.61–1.24)
GFR, Estimated: 49 mL/min — ABNORMAL LOW (ref >60)
Glucose, Bld: 135 mg/dL — ABNORMAL HIGH (ref 70–99)
Potassium: 3.5 mmol/L (ref 3.5–5.1)
Sodium: 144 mmol/L (ref 135–145)

## 2023-05-17 LAB — PHOSPHORUS: Phosphorus: 2.7 mg/dL (ref 2.5–4.6)

## 2023-05-17 LAB — CBC WITH DIFFERENTIAL/PLATELET
Abs Immature Granulocytes: 0.02 10*3/uL (ref 0.00–0.07)
Basophils Absolute: 0 10*3/uL (ref 0.0–0.1)
Basophils Relative: 0 %
Eosinophils Absolute: 0 10*3/uL (ref 0.0–0.5)
Eosinophils Relative: 0 %
HCT: 33.1 % — ABNORMAL LOW (ref 39.0–52.0)
Hemoglobin: 8.8 g/dL — ABNORMAL LOW (ref 13.0–17.0)
Immature Granulocytes: 0 %
Lymphocytes Relative: 21 %
Lymphs Abs: 1.1 10*3/uL (ref 0.7–4.0)
MCH: 19.8 pg — ABNORMAL LOW (ref 26.0–34.0)
MCHC: 26.6 g/dL — ABNORMAL LOW (ref 30.0–36.0)
MCV: 74.4 fL — ABNORMAL LOW (ref 80.0–100.0)
Monocytes Absolute: 0.7 10*3/uL (ref 0.1–1.0)
Monocytes Relative: 13 %
Neutro Abs: 3.5 10*3/uL (ref 1.7–7.7)
Neutrophils Relative %: 66 %
Platelets: 197 10*3/uL (ref 150–400)
RBC: 4.45 MIL/uL (ref 4.22–5.81)
RDW: 19.4 % — ABNORMAL HIGH (ref 11.5–15.5)
WBC: 5.4 10*3/uL (ref 4.0–10.5)
nRBC: 0.4 % — ABNORMAL HIGH (ref 0.0–0.2)

## 2023-05-17 LAB — C-REACTIVE PROTEIN: CRP: 3 mg/dL — ABNORMAL HIGH (ref <1.0)

## 2023-05-17 LAB — MAGNESIUM: Magnesium: 2 mg/dL (ref 1.7–2.4)

## 2023-05-17 NOTE — Progress Notes (Signed)
Pt refused morning vitals.  Pt wanted to sleep.

## 2023-05-17 NOTE — Plan of Care (Signed)

## 2023-05-17 NOTE — Plan of Care (Signed)

## 2023-05-17 NOTE — Progress Notes (Signed)
Progress Note    Gary [name]  ZOX:[id number] DOB: 02-Sep-1955  DOA: 05/14/2023 PCP: Shayne Alken, MD      Brief Narrative:    Medical records reviewed and are as summarized below:  Gary [name] is a 68 y.o. 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  EMS found him hypoxic with oxygen saturation in the 80s which improved with oxygenation via nonrebreather mask and trach suctioning en route to the ED. Of note, pt was discharged home with home health services on 04/22/2023 after being admitted on 11/28/2022 (a nearly 5 month admission).  Pt is well known to this hospital with 14 admissions in the past two years alone, often spending months in hospital while medically stable due to placement difficulties and/or pt refusal for placement.  Pt was discharged home with home health services last time.   He required BiPAP in the ED for acute hypoxemic respiratory failure.        Assessment/Plan:   Principal Problem:   Acute on chronic respiratory failure with hypoxia and hypercapnia ([name]) Active Problems:   (HFpEF) heart failure with preserved ejection fraction ([name])   Chronic obstructive pulmonary disease (COPD) ([name])   Acute GI bleeding   Rectal bleeding   Diarrhea    Body mass index is 47.47 kg/m.  (Morbid obesity)    Acute on chronic respiratory failure with hypoxia and hypercapnea  --he is off of BiPAP.  Oxygen requirement down from 10 L to 4 L/min.  He said he uses 2 L at home.  Continue to wean down oxygen as able. Use BiPAP at night. S/p tracheostomy status.  Janina Mayo is capped. Follow-up with pulmonologist   Severe COPD, OSA Probable COPD exacerbation.  Continue steroids and  bronchodilators.  Use BiPAP at night. CTA negative for PE or pneumonia or pulmonary edema.    Acute on chronic diastolic CHF (congestive heart failure) ([name]) S/p treatment with IV Lasix.  Continue torsemide and spironolactone   Norovirus diarrhea Acute GI bleeding Patient had maroon-colored stools on 05/16/2023.  No bloody stools today. He was evaluated by Dr. Servando Snare, gastroenterologist,.  No plan for endoscopic workup at this time.  Prior EGD in October 2024 showed hiatal hernia and Barrett's esophagus. Prior colonoscopy in May 2024 showed "hemorrhoids, diverticuli, random left colon biopsies r/o chronic colitis, hemostasis clip placement at anastomosis site, diverticulum biopsies r/o dysplasia".   Acute blood loss anemia, anemia of chronic disease Hemoglobin is stable at 8.8.  No indication for blood transfusion at this time.   Hypokalemia Continue potassium repletion.   CKD stage IIIa. Creatinine is stable.    Anxiety Continue Lexapro, Atarax PRN    History of DVT but no longer on Eliquis. Use SCDs for DVT prophylaxis.  No pharmacologic DVT prophylaxis for now because of acute GI bleeding     Diet Order             Diet Heart Room service appropriate? Yes; Fluid consistency: Thin  Diet effective now                            Consultants: Pulmonologist Gastroenterologist  Procedures: None    Medications:  amphetamine-dextroamphetamine  30 mg Oral Q breakfast   escitalopram  10 mg Oral Daily   mesalamine  1.2 g Oral BID   pantoprazole  40 mg Oral QHS   potassium chloride  40 mEq Oral Daily   predniSONE  50 mg Oral Q breakfast   spironolactone  25 mg Oral Daily   torsemide  40 mg Oral BID   Continuous Infusions:     Anti-infectives (From admission, onward)    Start     Dose/Rate Route Frequency Ordered Stop   05/14/23 1630  levofloxacin (LEVAQUIN) IVPB 750 mg  Status:  Discontinued        750 mg 100 mL/hr over 90 Minutes  Intravenous Every 24 hours 05/14/23 1623 05/15/23 0919              Family Communication/Anticipated D/C date and plan/Code Status   DVT prophylaxis: Place and maintain sequential compression device Start: 05/17/23 1034     Code Status: Full Code  Family Communication: None Disposition Plan: Plan to discharge home   Status is: Inpatient Remains inpatient appropriate because: Acute respiratory failure         Subjective:   Interval events noted.  No bloody diarrhea today.  No vomiting, abdominal pain, shortness of breath or chest pain.  Objective:    Vitals:   05/16/23 2045 05/17/23 0438 05/17/23 1010 05/17/23 1018  BP: 109/71  (!) 89/54 108/71  Pulse: 88  83   Resp: 18 18 18    Temp: 97.8 F (36.6 C)  97.8 F (36.6 C)   TempSrc: Oral  Oral   SpO2: 94%  94%   Weight:      Height:       No data found.   Intake/Output Summary (Last 24 hours) at 05/17/2023 1510 Last data filed at 05/17/2023 1417 Gross per 24 hour  Intake 1420 ml  Output 1200 ml  Net 220 ml   Filed Weights   05/14/23 0136 05/15/23 2212  Weight: (!) 199.6 kg (!) 176.9 kg    Exam:  GEN: NAD SKIN: Warm and dry EYES: No pallor or icterus ENT: MMM CV: RRR PULM: CTA B ABD: soft, obese, NT, +BS CNS: AAO x 3, non focal EXT: No edema or tenderness       Data Reviewed:   I have personally reviewed following labs and imaging studies:  Labs: Labs show the following:   Basic Metabolic Panel: Recent Labs  Lab 05/14/23 0129 05/15/23 0517 05/16/23 0527 05/17/23 0737  NA 137 138 139 144  K 3.5 3.1* 3.2* 3.5  CL 95* 97* 99 99  CO2 33* 30 33* 35*  GLUCOSE 105* 104* 85 135*  BUN 22 32* 38* 32*  CREATININE 1.36* 1.51* 1.68* 1.55*  CALCIUM 8.0* 7.8* 7.7* 7.6*  MG  --  1.8  --  2.0  PHOS  --   --   --  2.7   GFR Estimated Creatinine Clearance: 82.5 mL/min (A) (by C-G formula based on SCr of 1.55 mg/dL (H)). Liver Function Tests: Recent Labs  Lab 05/14/23 0129  AST  13*  ALT 11  ALKPHOS 92  BILITOT 0.9  PROT 8.8*  ALBUMIN 2.9*   No results for input(s): "LIPASE", "AMYLASE" in the last 168 hours. No results for input(s): "AMMONIA" in the last 168 hours. Coagulation profile No results for input(s): "INR", "PROTIME" in the last 168 hours.  CBC: Recent Labs  Lab 05/14/23 0129 05/16/23 1212 05/16/23 1751 05/17/23 0737  WBC 7.5 5.5  --  5.4  NEUTROABS 6.4 4.5  --  3.5  HGB 9.8* 8.7* 8.8* 8.8*  HCT 38.7* 32.6* 33.4* 33.1*  MCV 78.3* 75.3*  --  74.4*  PLT 205 195  --  197   Cardiac Enzymes: No results for input(s): "CKTOTAL", "CKMB", "CKMBINDEX", "TROPONINI" in the last 168 hours. BNP (last 3 results) No results for input(s): "PROBNP" in the last 8760 hours. CBG: No results for input(s): "GLUCAP" in the last 168 hours. D-Dimer: No results for input(s): "DDIMER" in the last 72 hours. Hgb A1c: No results for input(s): "HGBA1C" in the last 72 hours. Lipid Profile: No results for input(s): "CHOL", "HDL", "LDLCALC", "TRIG", "CHOLHDL", "LDLDIRECT" in the last 72 hours. Thyroid function studies: No results for input(s): "TSH", "T4TOTAL", "T3FREE", "THYROIDAB" in the last 72 hours.  Invalid input(s): "FREET3" Anemia work up: No results for input(s): "VITAMINB12", "FOLATE", "FERRITIN", "TIBC", "IRON", "RETICCTPCT" in the last 72 hours. Sepsis Labs: Recent Labs  Lab 05/14/23 0129 05/14/23 0308 05/16/23 1212 05/17/23 0737  WBC 7.5  --  5.5 5.4  LATICACIDVEN 0.9 0.8  --   --     Microbiology Recent Results (from the past 240 hours)  Culture, blood (routine x 2)     Status: None (Preliminary result)   Collection Time: 05/14/23  1:29 AM   Specimen: BLOOD  Result Value Ref Range Status   Specimen Description BLOOD  Final   Special Requests   Final    BOTTLES DRAWN AEROBIC AND ANAEROBIC Blood Culture adequate volume   Culture   Final    NO GROWTH 3 DAYS Performed at [facility], [address] [place], [place] [address]     Report Status PENDING  Incomplete  Culture, blood (routine x 2)     Status: None (Preliminary result)   Collection Time: 05/14/23  3:08 AM   Specimen: BLOOD  Result Value Ref Range Status   Specimen Description BLOOD RIGHT AC  Final   Special Requests   Final    BOTTLES DRAWN AEROBIC AND ANAEROBIC Blood Culture results may not be optimal due to an inadequate volume of blood received in culture bottles   Culture   Final    NO GROWTH 3 DAYS Performed at [facility], [address] [place], [place] [address]    Report Status PENDING  Incomplete  Resp panel by RT-PCR (RSV, Flu A&B, Covid) Anterior Nasal Swab     Status: None   Collection Time: 05/14/23  3:08 AM   Specimen: Anterior Nasal Swab  Result Value Ref Range Status   SARS Coronavirus 2 by RT PCR NEGATIVE NEGATIVE Final    Comment: (NOTE) SARS-CoV-2 target nucleic acids are NOT DETECTED.  The SARS-CoV-2 RNA is generally detectable in upper respiratory specimens during the acute phase of infection. The lowest concentration of SARS-CoV-2 viral copies this assay can detect is 138 copies/mL. A negative result does not preclude SARS-Cov-2 infection and should not be used as the sole basis for treatment or other patient management decisions. A negative result may occur with  improper specimen collection/handling, submission of specimen other than nasopharyngeal swab, presence of viral mutation(s) within the areas targeted by this assay, and inadequate number of viral copies(<138 copies/mL). A negative result must be combined with clinical observations, patient history, and epidemiological information. The expected result is Negative.  Fact Sheet for Patients:  BloggerCourse.com  Fact Sheet for Healthcare Providers:  SeriousBroker.it  This test is no t yet approved or cleared by the [place] and  has been authorized for detection and/or diagnosis of SARS-CoV-2  by FDA under an Emergency Use Authorization (EUA). This EUA will remain  in effect (meaning this test can be used) for the duration of the COVID-19 declaration under Section 564(b)(1) of the Act, 21 U.S.C.section 360bbb-3(b)(1), unless the authorization is terminated  or revoked sooner.       Influenza A by PCR NEGATIVE NEGATIVE Final   Influenza B by PCR NEGATIVE NEGATIVE Final    Comment: (NOTE) The Xpert Xpress SARS-CoV-2/FLU/RSV plus assay is intended as an aid in the diagnosis of influenza from Nasopharyngeal swab specimens and should not be used as a sole basis for treatment. Nasal washings and aspirates are unacceptable for Xpert Xpress SARS-CoV-2/FLU/RSV testing.  Fact Sheet for Patients: BloggerCourse.com  Fact Sheet for Healthcare Providers: SeriousBroker.it  This test is not yet approved or cleared by the [place] FDA and has been authorized for detection and/or diagnosis of SARS-CoV-2 by FDA under an Emergency Use Authorization (EUA). This EUA will remain in effect (meaning this test can be used) for the duration of the COVID-19 declaration under Section 564(b)(1) of the Act, 21 U.S.C. section 360bbb-3(b)(1), unless the authorization is terminated or revoked.     Resp Syncytial Virus by PCR NEGATIVE NEGATIVE Final    Comment: (NOTE) Fact Sheet for Patients: BloggerCourse.com  Fact Sheet for Healthcare Providers: SeriousBroker.it  This test is not yet approved or cleared by the [place] FDA and has been authorized for detection and/or diagnosis of SARS-CoV-2 by FDA under an Emergency Use Authorization (EUA). This EUA will remain in effect (meaning this test can be used) for the duration of the COVID-19 declaration under Section 564(b)(1) of the Act, 21 U.S.C. section 360bbb-3(b)(1), unless the authorization is terminated or revoked.  Performed at  [facility], [address] [place], [place] [address]   Respiratory (~20 pathogens) panel by PCR     Status: None   Collection Time: 05/14/23  6:54 AM   Specimen: Nasopharyngeal Swab; Respiratory  Result Value Ref Range Status   Adenovirus NOT DETECTED NOT DETECTED Final   Coronavirus 229E NOT DETECTED NOT DETECTED Final    Comment: (NOTE) The Coronavirus on the Respiratory Panel, DOES NOT test for the novel  Coronavirus (2019 nCoV)    Coronavirus HKU1 NOT DETECTED NOT DETECTED Final   Coronavirus NL63 NOT DETECTED NOT DETECTED Final   Coronavirus OC43 NOT DETECTED NOT DETECTED Final   Metapneumovirus NOT DETECTED NOT DETECTED Final   Rhinovirus / Enterovirus NOT DETECTED NOT DETECTED Final   Influenza A NOT DETECTED NOT DETECTED Final   Influenza B NOT DETECTED NOT DETECTED Final   Parainfluenza Virus 1 NOT DETECTED NOT DETECTED Final   Parainfluenza Virus 2 NOT DETECTED NOT DETECTED Final   Parainfluenza Virus 3 NOT DETECTED NOT DETECTED Final   Parainfluenza Virus 4 NOT DETECTED NOT DETECTED Final   Respiratory Syncytial Virus NOT DETECTED NOT DETECTED Final   Bordetella pertussis NOT DETECTED NOT DETECTED Final   Bordetella Parapertussis NOT DETECTED NOT DETECTED Final   Chlamydophila pneumoniae NOT DETECTED NOT DETECTED Final   Mycoplasma pneumoniae NOT DETECTED NOT DETECTED Final    Comment: Performed at [facility], [address], [place], [place] [address]  C Difficile Quick Screen w PCR reflex     Status: None   Collection Time: 05/15/23 12:19 AM   Specimen: STOOL  Result Value Ref Range Status   C Diff antigen NEGATIVE NEGATIVE Final   C Diff toxin  NEGATIVE NEGATIVE Final   C Diff interpretation No C. difficile detected.  Final    Comment: Performed at [facility], [address] [place], [place] [address]  Gastrointestinal Panel by PCR , Stool     Status: Abnormal   Collection Time: 05/15/23 12:19 AM   Specimen: STOOL  Result Value  Ref Range Status   Campylobacter species NOT DETECTED NOT DETECTED Final   Plesimonas shigelloides NOT DETECTED NOT DETECTED Final   Salmonella species NOT DETECTED NOT DETECTED Final   Yersinia enterocolitica NOT DETECTED NOT DETECTED Final   Vibrio species NOT DETECTED NOT DETECTED Final   Vibrio cholerae NOT DETECTED NOT DETECTED Final   Enteroaggregative E coli (EAEC) NOT DETECTED NOT DETECTED Final   Enteropathogenic E coli (EPEC) NOT DETECTED NOT DETECTED Final   Enterotoxigenic E coli (ETEC) NOT DETECTED NOT DETECTED Final   Shiga like toxin producing E coli (STEC) NOT DETECTED NOT DETECTED Final   Shigella/Enteroinvasive E coli (EIEC) NOT DETECTED NOT DETECTED Final   Cryptosporidium NOT DETECTED NOT DETECTED Final   Cyclospora cayetanensis NOT DETECTED NOT DETECTED Final   Entamoeba histolytica NOT DETECTED NOT DETECTED Final   Giardia lamblia NOT DETECTED NOT DETECTED Final   Adenovirus F40/41 NOT DETECTED NOT DETECTED Final   Astrovirus NOT DETECTED NOT DETECTED Final   Norovirus GI/GII DETECTED (A) NOT DETECTED Final    Comment: RESULT CALLED TO, READ BACK BY AND VERIFIED WITH: [email or web address] 05/15/23 6045 MW    Rotavirus A NOT DETECTED NOT DETECTED Final   Sapovirus (I, II, IV, and V) NOT DETECTED NOT DETECTED Final    Comment: Performed at [facility], [address] [place], [place] [address]    Procedures and diagnostic studies:  No results found.              LOS: 2 days   Coralee Edberg  [facility]   Pager on [email or web address]. If 7PM-7AM, please contact night-coverage at [email or web address]     05/17/2023, 3:10 PM
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
## 2023-05-17 NOTE — Evaluation (Addendum)
Occupational Therapy Evaluation Patient Details Name: Gary [name] MRN: [id number] DOB: [birth year] Today's Date: 05/17/2023   History of Present Illness Patient is a 68 year old male coming from home with acute on chronic hypoxemic and hypercapnic respiratory failure, gastroenteritis from norovirus. History of prolonged hospitalizations, respiratory failure, tracheostomy, chronic lung disease with COPD and OSA, CHF, CKD, IDA, morbid obesity, deconditioning.   Clinical Impression   Mr Tith was seen for OT evaluation this date. Prior to hospital admission, pt was MOD I using RW at home and hospital bed. Pt lives alone, private pay for aid. Pt currently requires SUPERVISION + RW for bed mobility and ADL t/f, distance limited by concerns for diarrhea. Pt appears near recent baseline. Pt would benefit from skilled OT to address noted impairments and functional limitations (see below for any additional details). Upon hospital discharge, recommend HHOT follow up.    If plan is discharge home, recommend the following: A little help with walking and/or transfers;A lot of help with bathing/dressing/bathroom    Functional Status Assessment  Patient has had a recent decline in their functional status and demonstrates the ability to make significant improvements in function in a reasonable and predictable amount of time.  Equipment Recommendations  None recommended by OT    Recommendations for Other Services       Precautions / Restrictions Precautions Precautions: Fall Precaution Comments: capped trach, 4.5L via HFNC Restrictions Weight Bearing Restrictions Per Provider Order: No      Mobility Bed Mobility Overal bed mobility: Modified Independent             General bed mobility comments: increased time required    Transfers Overall transfer level: Needs assistance Equipment used: Rolling walker (2 wheels) (bariatric rolling walker) Transfers: Sit to/from Stand Sit to Stand:  Contact guard assist, From elevated surface           General transfer comment: elevated bed - baseline      Balance Overall balance assessment: Needs assistance Sitting-balance support: No upper extremity supported Sitting balance-[name] Scale: Normal Sitting balance - Comments: unsupported sitting EOB   Standing balance support: Bilateral upper extremity supported Standing balance-[name] Scale: Fair                             ADL either performed or assessed with clinical judgement   ADL Overall ADL's : Needs assistance/impaired                                       General ADL Comments: SUPERVISION + RW for ADL t/f, distance limited by concerns for diarrhea.      Pertinent Vitals/Pain Pain Assessment Pain Assessment: No/denies pain     Extremity/Trunk Assessment Upper Extremity Assessment Upper Extremity Assessment: Overall WFL for tasks assessed   Lower Extremity Assessment Lower Extremity Assessment: Generalized weakness       Communication Communication Communication: No apparent difficulties   Cognition Arousal: Alert Behavior During Therapy: WFL for tasks assessed/performed Overall Cognitive Status: Within Functional Limits for tasks assessed                                                  Home Living Family/patient expects to be discharged to:: Private  residence Living Arrangements: Alone Available Help at Discharge: Available PRN/intermittently;Family;Personal care attendant Type of Home: Apartment Home Access: Level entry;Ramped entrance     Home Layout: One level     Bathroom Shower/Tub: Chief Strategy Officer: Handicapped height     Home Equipment: Wheelchair - Nurse, children's (2 wheels);Hospital bed   Additional Comments: patient was private paying an aide 4 hours/day for 5 days per week. groceries delivered from [place] or family assisted      Prior  Functioning/Environment Prior Level of Function : Needs assist             Mobility Comments: Mod I using RW for limited distance ambulation in house. no assist required for standing from hospital bed from elevated bed height ADLs Comments: intermittent assistance for ADLs        OT Problem List: Decreased strength;Decreased range of motion;Decreased activity tolerance;Impaired balance (sitting and/or standing);Decreased safety awareness;Decreased knowledge of use of DME or AE;Decreased knowledge of precautions;Cardiopulmonary status limiting activity;Increased edema      OT Treatment/Interventions: Self-care/ADL training;Therapeutic exercise;DME and/or AE instruction;Therapeutic activities;Patient/family education;Balance training    OT Goals(Current goals can be found in the care plan section) Acute Rehab OT Goals OT Goal Formulation: With patient Time For Goal Achievement: 05/31/23 Potential to Achieve Goals: Good ADL Goals Pt Will Perform Grooming: with modified independence;standing Pt Will Perform Lower Body Dressing: with modified independence;with caregiver independent in assisting;with adaptive equipment;sit to/from stand Pt Will Transfer to Toilet: with modified independence;ambulating;regular height toilet  OT Frequency: Min 1X/week    Co-evaluation PT/OT/SLP Co-Evaluation/Treatment: Yes Reason for Co-Treatment: To address functional/ADL transfers PT goals addressed during session: Mobility/safety with mobility OT goals addressed during session: ADL's and self-care      AM-PAC OT "6 Clicks" Daily Activity     Outcome Measure Help from another person eating meals?: None Help from another person taking care of personal grooming?: A Little Help from another person toileting, which includes using toliet, bedpan, or urinal?: A Lot Help from another person bathing (including washing, rinsing, drying)?: A Lot Help from another person to put on and taking off regular upper  body clothing?: None Help from another person to put on and taking off regular lower body clothing?: A Lot 6 Click Score: 17   End of Session Equipment Utilized During Treatment: Rolling walker (2 wheels);Oxygen Nurse Communication: Mobility status  Activity Tolerance: Patient tolerated treatment well Patient left: in bed;with call bell/phone within reach  OT Visit Diagnosis: Unsteadiness on feet (R26.81);Other abnormalities of gait and mobility (R26.89);Muscle weakness (generalized) (M62.81);Adult, failure to thrive (R62.7)                Time: 7829-5621 OT Time Calculation (min): 18 min Charges:  OT General Charges $OT Visit: 1 Visit OT Evaluation $OT Eval Moderate Complexity: 1 Mod  Kathie Dike, M.S. OTR/L  05/17/23, 2:58 PM  ascom [phone]

## 2023-05-17 NOTE — Progress Notes (Signed)
Pt refusing to let staff titrate his O2 down to his baseline of 2L per MD order. Currently on 4.5L, states that he will let staff know if he wants it changed. This RN provided education on the importance of only utilizing the amount of O2 needed. MD notified.

## 2023-05-17 NOTE — Evaluation (Signed)
Physical Therapy Evaluation Patient Details Name: Gary [name] MRN: [id number] DOB: 05-18-1955 Today's Date: 05/17/2023  History of Present Illness  Patient is a 68 year old male coming from home with acute on chronic hypoxemic and hypercapnic respiratory failure, gastroenteritis from norovirus. History of prolonged hospitalizations, respiratory failure, tracheostomy, chronic lung disease with COPD and OSA, CHF, CKD, IDA, morbid obesity, deconditioning.   Clinical Impression  Patient reports he was walking short distance with rolling walker on his own in the apartment. He can stand without assistance in the apartment from elevated bed height with DME in place at home.  Today, the patient is near his baseline level of functional mobility. He was able to stand with CGA with bed height elevated using bariatric rolling walker. He was able to stand several side steps with supervision using rolling walker. Further walking attempts self limited. No shortness of breath is noted with mobility. Sitting balance is normal. Recommend PT follow up to maximize independence in preparation for discharge home.       If plan is discharge home, recommend the following: Assistance with cooking/housework;Help with stairs or ramp for entrance;Assist for transportation;A little help with bathing/dressing/bathroom;A little help with walking and/or transfers   Can travel by private vehicle   No    Equipment Recommendations None recommended by PT  Recommendations for Other Services       Functional Status Assessment Patient has had a recent decline in their functional status and demonstrates the ability to make significant improvements in function in a reasonable and predictable amount of time.     Precautions / Restrictions Precautions Precautions: Fall Precaution Comments: capped trach, 4.5L via HFNC Restrictions Weight Bearing Restrictions Per Provider Order: No      Mobility  Bed Mobility Overal bed  mobility: Modified Independent             General bed mobility comments: increased time required    Transfers Overall transfer level: Needs assistance Equipment used: Rolling walker (2 wheels) (bariatric rolling walker) Transfers: Sit to/from Stand Sit to Stand: Contact guard assist, From elevated surface           General transfer comment: bed height elevated almost maximally with no physical lifting assistance required. decreased eccentric control with sitting    Ambulation/Gait             Pre-gait activities: patient able to take side steps with supervision using rolling walker. did not progress walking further due to patient preference    Stairs            Wheelchair Mobility     Tilt Bed    Modified Rankin (Stroke Patients Only)       Balance Overall balance assessment: Needs assistance Sitting-balance support: No upper extremity supported Sitting balance-[name] Scale: Normal Sitting balance - Comments: unsupported sitting EOB   Standing balance support: Bilateral upper extremity supported Standing balance-[name] Scale: Fair                               Pertinent Vitals/Pain Pain Assessment Pain Assessment: No/denies pain    Home Living Family/patient expects to be discharged to:: Private residence Living Arrangements: Alone Available Help at Discharge: Available PRN/intermittently;Family;Personal care attendant Type of Home: Apartment Home Access: Level entry;Ramped entrance       Home Layout: One level Home Equipment: Wheelchair - Nurse, children's (2 wheels);Hospital bed Additional Comments: patient was private paying an aide 4 hours/day for 5  days per week. groceries delivered from [place] or family assisted    Prior Function Prior Level of Function : Needs assist             Mobility Comments: Mod I using RW for limited distance ambulation in house. no assist required for standing from hospital  bed from elevated bed height ADLs Comments: intermittent assistance for ADLs     Extremity/Trunk Assessment   Upper Extremity Assessment Upper Extremity Assessment: Defer to OT evaluation    Lower Extremity Assessment Lower Extremity Assessment: Generalized weakness (endurance impaired for sustained activity)       Communication   Communication Communication: No apparent difficulties  Cognition Arousal: Alert Behavior During Therapy: WFL for tasks assessed/performed Overall Cognitive Status: Within Functional Limits for tasks assessed                                          General Comments General comments (skin integrity, edema, etc.): no shortness of breath with activity. check pad was soiled with stool with standing and replaced with clean pad    Exercises     Assessment/Plan    PT Assessment Patient needs continued PT services  PT Problem List Decreased strength;Decreased activity tolerance;Decreased balance;Decreased mobility       PT Treatment Interventions DME instruction;Gait training;Functional mobility training;Therapeutic activities;Balance training;Therapeutic exercise;Neuromuscular re-education;Patient/family education    PT Goals (Current goals can be found in the Care Plan section)  Acute Rehab PT Goals Patient Stated Goal: to feel better and go home PT Goal Formulation: With patient Time For Goal Achievement: 05/31/23 Potential to Achieve Goals: Fair    Frequency Min 1X/week     Co-evaluation PT/OT/SLP Co-Evaluation/Treatment: Yes Reason for Co-Treatment: To address functional/ADL transfers PT goals addressed during session: Mobility/safety with mobility         AM-PAC PT "6 Clicks" Mobility  Outcome Measure Help needed turning from your back to your side while in a flat bed without using bedrails?: None Help needed moving from lying on your back to sitting on the side of a flat bed without using bedrails?: A Little Help  needed moving to and from a bed to a chair (including a wheelchair)?: A Little Help needed standing up from a chair using your arms (e.g., wheelchair or bedside chair)?: A Little Help needed to walk in hospital room?: A Little Help needed climbing 3-5 steps with a railing? : A Little 6 Click Score: 19    End of Session Equipment Utilized During Treatment: Oxygen Activity Tolerance: Patient tolerated treatment well Patient left: in bed;with call bell/phone within reach;with bed alarm set   PT Visit Diagnosis: Muscle weakness (generalized) (M62.81);Difficulty in walking, not elsewhere classified (R26.2)    Time: 1300-1320 PT Time Calculation (min) (ACUTE ONLY): 20 min   Charges:   PT Evaluation $PT Eval Low Complexity: 1 Low   PT General Charges $$ ACUTE PT VISIT: 1 Visit         [name], PT, MPT   [name] 05/17/2023, 1:59 PM

## 2023-05-17 NOTE — Progress Notes (Signed)
Gary Minium, MD [facility]   [address] [place], [place] [address] Phone: [phone] Fax : [phone]   Subjective: The patient was reported to have a small amount of rectal bleeding overnight but his hemoglobin has been stable.  As stated in my consult the patient had a colonoscopy within the last year with diverticulosis in the sigmoid and descending colon with an end to and ileocolonic anastomosis.   Objective: Vital signs in last 24 hours: Vitals:   05/16/23 0849 05/16/23 1729 05/16/23 2045 05/17/23 0438  BP:  113/71 109/71   Pulse: 80 85 88   Resp: 18 16 18 18   Temp:  97.7 F (36.5 C) 97.8 F (36.6 C)   TempSrc:   Oral   SpO2: 97% 96% 94%   Weight:      Height:       Weight change:   Intake/Output Summary (Last 24 hours) at 05/17/2023 3557 Last data filed at 05/17/2023 3220 Gross per 24 hour  Intake 1280 ml  Output 2200 ml  Net -920 ml     Exam: General: Patient laying on his back in bed in no apparent distress with his CPAP machine on.  Lab Results: @LABTEST2 @ Micro Results: Recent Results (from the past 240 hours)  Culture, blood (routine x 2)     Status: None (Preliminary result)   Collection Time: 05/14/23  1:29 AM   Specimen: BLOOD  Result Value Ref Range Status   Specimen Description BLOOD  Final   Special Requests   Final    BOTTLES DRAWN AEROBIC AND ANAEROBIC Blood Culture adequate volume   Culture   Final    NO GROWTH 3 DAYS Performed at [facility], [address] [place], [place] [address]    Report Status PENDING  Incomplete  Culture, blood (routine x 2)     Status: None (Preliminary result)   Collection Time: 05/14/23  3:08 AM   Specimen: BLOOD  Result Value Ref Range Status   Specimen Description BLOOD RIGHT AC  Final   Special Requests   Final    BOTTLES DRAWN AEROBIC AND ANAEROBIC Blood Culture results may not be optimal due to an inadequate volume of blood received in culture bottles   Culture   Final    NO  GROWTH 3 DAYS Performed at [facility], [address] [place], [place] [address]    Report Status PENDING  Incomplete  Resp panel by RT-PCR (RSV, Flu A&B, Covid) Anterior Nasal Swab     Status: None   Collection Time: 05/14/23  3:08 AM   Specimen: Anterior Nasal Swab  Result Value Ref Range Status   SARS Coronavirus 2 by RT PCR NEGATIVE NEGATIVE Final    Comment: (NOTE) SARS-CoV-2 target nucleic acids are NOT DETECTED.  The SARS-CoV-2 RNA is generally detectable in upper respiratory specimens during the acute phase of infection. The lowest concentration of SARS-CoV-2 viral copies this assay can detect is 138 copies/mL. A negative result does not preclude SARS-Cov-2 infection and should not be used as the sole basis for treatment or other patient management decisions. A negative result may occur with  improper specimen collection/handling, submission of specimen other than nasopharyngeal swab, presence of viral mutation(s) within the areas targeted by this assay, and inadequate number of viral copies(<138 copies/mL). A negative result must be combined with clinical observations, patient history, and epidemiological information. The expected result is Negative.  Fact Sheet for Patients:  BloggerCourse.com  Fact Sheet for Healthcare Providers:  SeriousBroker.it  This test is no t yet approved or cleared by the [place] and  has been authorized for detection and/or diagnosis of SARS-CoV-2 by FDA under an Emergency Use Authorization (EUA). This EUA will remain  in effect (meaning this test can be used) for the duration of the COVID-19 declaration under Section 564(b)(1) of the Act, 21 U.S.C.section 360bbb-3(b)(1), unless the authorization is terminated  or revoked sooner.       Influenza A by PCR NEGATIVE NEGATIVE Final   Influenza B by PCR NEGATIVE NEGATIVE Final    Comment: (NOTE) The Xpert Xpress  SARS-CoV-2/FLU/RSV plus assay is intended as an aid in the diagnosis of influenza from Nasopharyngeal swab specimens and should not be used as a sole basis for treatment. Nasal washings and aspirates are unacceptable for Xpert Xpress SARS-CoV-2/FLU/RSV testing.  Fact Sheet for Patients: BloggerCourse.com  Fact Sheet for Healthcare Providers: SeriousBroker.it  This test is not yet approved or cleared by the [place] FDA and has been authorized for detection and/or diagnosis of SARS-CoV-2 by FDA under an Emergency Use Authorization (EUA). This EUA will remain in effect (meaning this test can be used) for the duration of the COVID-19 declaration under Section 564(b)(1) of the Act, 21 U.S.C. section 360bbb-3(b)(1), unless the authorization is terminated or revoked.     Resp Syncytial Virus by PCR NEGATIVE NEGATIVE Final    Comment: (NOTE) Fact Sheet for Patients: BloggerCourse.com  Fact Sheet for Healthcare Providers: SeriousBroker.it  This test is not yet approved or cleared by the [place] FDA and has been authorized for detection and/or diagnosis of SARS-CoV-2 by FDA under an Emergency Use Authorization (EUA). This EUA will remain in effect (meaning this test can be used) for the duration of the COVID-19 declaration under Section 564(b)(1) of the Act, 21 U.S.C. section 360bbb-3(b)(1), unless the authorization is terminated or revoked.  Performed at [facility], [address] [place], [place] [address]   Respiratory (~20 pathogens) panel by PCR     Status: None   Collection Time: 05/14/23  6:54 AM   Specimen: Nasopharyngeal Swab; Respiratory  Result Value Ref Range Status   Adenovirus NOT DETECTED NOT DETECTED Final   Coronavirus 229E NOT DETECTED NOT DETECTED Final    Comment: (NOTE) The Coronavirus on the Respiratory Panel, DOES NOT test for the novel   Coronavirus (2019 nCoV)    Coronavirus HKU1 NOT DETECTED NOT DETECTED Final   Coronavirus NL63 NOT DETECTED NOT DETECTED Final   Coronavirus OC43 NOT DETECTED NOT DETECTED Final   Metapneumovirus NOT DETECTED NOT DETECTED Final   Rhinovirus / Enterovirus NOT DETECTED NOT DETECTED Final   Influenza A NOT DETECTED NOT DETECTED Final   Influenza B NOT DETECTED NOT DETECTED Final   Parainfluenza Virus 1 NOT DETECTED NOT DETECTED Final   Parainfluenza Virus 2 NOT DETECTED NOT DETECTED Final   Parainfluenza Virus 3 NOT DETECTED NOT DETECTED Final   Parainfluenza Virus 4 NOT DETECTED NOT DETECTED Final   Respiratory Syncytial Virus NOT DETECTED NOT DETECTED Final   Bordetella pertussis NOT DETECTED NOT DETECTED Final   Bordetella Parapertussis NOT DETECTED NOT DETECTED Final   Chlamydophila pneumoniae NOT DETECTED NOT DETECTED Final   Mycoplasma pneumoniae NOT DETECTED NOT DETECTED Final    Comment: Performed at [facility], [address], [place], [place] [address]  C Difficile Quick Screen w PCR reflex     Status: None   Collection Time: 05/15/23 12:19 AM   Specimen: STOOL  Result Value Ref Range Status   C Diff antigen NEGATIVE NEGATIVE Final   C Diff toxin NEGATIVE NEGATIVE Final   C Diff interpretation No C. difficile detected.  Final    Comment: Performed at [facility], [address] [place], [place] [address]  Gastrointestinal Panel by PCR , Stool     Status: Abnormal   Collection Time: 05/15/23 12:19 AM   Specimen: STOOL  Result Value Ref Range Status   Campylobacter species NOT DETECTED NOT DETECTED Final   Plesimonas shigelloides NOT DETECTED NOT DETECTED Final   Salmonella species NOT DETECTED NOT DETECTED Final   Yersinia enterocolitica NOT DETECTED NOT DETECTED Final   Vibrio species NOT DETECTED NOT DETECTED Final   Vibrio cholerae NOT DETECTED NOT DETECTED Final   Enteroaggregative E coli (EAEC) NOT DETECTED NOT DETECTED Final   Enteropathogenic E  coli (EPEC) NOT DETECTED NOT DETECTED Final   Enterotoxigenic E coli (ETEC) NOT DETECTED NOT DETECTED Final   Shiga like toxin producing E coli (STEC) NOT DETECTED NOT DETECTED Final   Shigella/Enteroinvasive E coli (EIEC) NOT DETECTED NOT DETECTED Final   Cryptosporidium NOT DETECTED NOT DETECTED Final   Cyclospora cayetanensis NOT DETECTED NOT DETECTED Final   Entamoeba histolytica NOT DETECTED NOT DETECTED Final   Giardia lamblia NOT DETECTED NOT DETECTED Final   Adenovirus F40/41 NOT DETECTED NOT DETECTED Final   Astrovirus NOT DETECTED NOT DETECTED Final   Norovirus GI/GII DETECTED (A) NOT DETECTED Final    Comment: RESULT CALLED TO, READ BACK BY AND VERIFIED WITH: PATRICKWARRICK 05/15/23 5176 MW    Rotavirus A NOT DETECTED NOT DETECTED Final   Sapovirus (I, II, IV, and V) NOT DETECTED NOT DETECTED Final    Comment: Performed at [facility], [address], [place], [place] [address]   Studies/Results: No results found. Medications: I have reviewed the patient's current medications. Scheduled Meds:  amphetamine-dextroamphetamine  30 mg Oral Q breakfast   escitalopram  10 mg Oral Daily   mesalamine  1.2 g Oral BID   pantoprazole  40 mg Oral QHS   potassium chloride  40 mEq Oral Daily   predniSONE  50 mg Oral Q breakfast   spironolactone  25 mg Oral Daily   torsemide  40 mg Oral BID   Continuous Infusions: PRN Meds:.acetaminophen **OR** acetaminophen, hydrOXYzine, ipratropium-albuterol, oxyCODONE, traZODone   Assessment: Principal Problem:   Acute on chronic respiratory failure with hypoxia and hypercapnia ([name]) Active Problems:   (HFpEF) heart failure with preserved ejection fraction ([name])   Chronic obstructive pulmonary disease (COPD) ([name])   Acute GI bleeding   Rectal bleeding    Plan: This patient likely has bleeding from his diverticulosis since he had a colonoscopy within the last year.  His hemoglobin is stable despite the passage of a small amount  of blood overnight.  Nothing to do further from a GI point of view.  Dr. Mia Creek will be on-call this weekend and please notify him if there is any change in the patient's status otherwise he will just follow labs.   LOS: 2 days   Gary Minium, MD.FACG 05/17/2023, 9:18 AM Pager [phone] 7am-5pm  Check AMION for 5pm -7am coverage and on weekends

## 2023-05-18 DIAGNOSIS — J9621 Acute and chronic respiratory failure with hypoxia: Secondary | ICD-10-CM | POA: Diagnosis not present

## 2023-05-18 DIAGNOSIS — J9622 Acute and chronic respiratory failure with hypercapnia: Secondary | ICD-10-CM | POA: Diagnosis not present

## 2023-05-18 LAB — BASIC METABOLIC PANEL
Anion gap: 11 (ref 5–15)
BUN: 36 mg/dL — ABNORMAL HIGH (ref 8–23)
CO2: 35 mmol/L — ABNORMAL HIGH (ref 22–32)
Calcium: 7.7 mg/dL — ABNORMAL LOW (ref 8.9–10.3)
Chloride: 94 mmol/L — ABNORMAL LOW (ref 98–111)
Creatinine, Ser: 1.63 mg/dL — ABNORMAL HIGH (ref 0.61–1.24)
GFR, Estimated: 46 mL/min — ABNORMAL LOW (ref >60)
Glucose, Bld: 143 mg/dL — ABNORMAL HIGH (ref 70–99)
Potassium: 3.5 mmol/L (ref 3.5–5.1)
Sodium: 140 mmol/L (ref 135–145)

## 2023-05-18 LAB — HEMOGLOBIN AND HEMATOCRIT, BLOOD
HCT: 35.6 % — ABNORMAL LOW (ref 39.0–52.0)
Hemoglobin: 9.1 g/dL — ABNORMAL LOW (ref 13.0–17.0)

## 2023-05-18 LAB — C-REACTIVE PROTEIN: CRP: 1.4 mg/dL — ABNORMAL HIGH (ref <1.0)

## 2023-05-18 MED ORDER — LOPERAMIDE HCL 2 MG PO CAPS
2.0000 mg | ORAL_CAPSULE | ORAL | Status: DC | PRN
  Filled 2023-05-18: qty 1

## 2023-05-18 MED ORDER — PREDNISONE 20 MG PO TABS
40.0000 mg | ORAL_TABLET | Freq: Every day | ORAL | Status: DC
Start: 2023-05-19 — End: 2023-05-20
  Administered 2023-05-19 – 2023-05-20 (×2): 40 mg via ORAL
  Filled 2023-05-18 (×2): qty 2

## 2023-05-18 NOTE — Progress Notes (Signed)
Mobility Specialist - Progress Note   05/18/23 1410  Mobility  Activity Stood at bedside;Dangled on edge of bed  Level of Assistance +2 (takes two people)  Location manager Ambulated (ft) 0 ft  Activity Response Tolerated well  Mobility Referral Yes  Mobility visit 1 Mobility  Mobility Specialist Start Time (ACUTE ONLY) 1343  Mobility Specialist Stop Time (ACUTE ONLY) 1407  Mobility Specialist Time Calculation (min) (ACUTE ONLY) 24 min   Pt supine in bed on Trach upon arrival. Pt completes bed mobility indep with extra time. Pt needs Max ast to don/doff socks. Pt STS X2 SBA +2 for safety. Pt returns to bed with needs in reach.   Gary [name]  Mobility Specialist  05/18/23 2:12 PM

## 2023-05-18 NOTE — Progress Notes (Addendum)
Progress Note    Gary [name]  GGY:[id number] DOB: [birth year]  DOA: 05/14/2023 PCP: Shayne Alken, MD      Brief Narrative:    Medical records reviewed and are as summarized below:  Gary [name] is a 68 y.o. 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  EMS found him hypoxic with oxygen saturation in the 80s which improved with oxygenation via nonrebreather mask and trach suctioning en route to the ED. Of note, Gary [name] was discharged home with home health services on 04/22/2023 after being admitted on 11/28/2022 (a nearly 5 month admission).  Gary [name] is well known to this hospital with 14 admissions in the past two years alone, often spending months in hospital while medically stable due to placement difficulties and/or Gary [name] refusal for placement.  Gary [name] was discharged home with home health services last time.   Gary [name] required BiPAP in the ED for acute hypoxemic respiratory failure.        Assessment/Plan:   Principal Problem:   Acute on chronic respiratory failure with hypoxia and hypercapnia ([name]) Active Problems:   (HFpEF) heart failure with preserved ejection fraction ([name])   Chronic obstructive pulmonary disease (COPD) ([name])   Acute GI bleeding   Rectal bleeding   Diarrhea    Body mass index is 47.47 kg/m.  (Morbid obesity)    Acute on chronic respiratory failure with hypoxia and hypercapnea  --Gary [name] is off of BiPAP.  Oxygen requirement down from 10 L to 4 L/min.  Gary [name] said Gary [name] uses 2 L at home.  Gary [name] did not allow [place] to try to wean down oxygen.  Continue to wean down oxygen as able. Use BiPAP at night. S/p tracheostomy status.  Janina Mayo is capped. Follow-up with pulmonologist   Severe COPD,  OSA Probable COPD exacerbation.  Continue steroids and bronchodilators.  Use BiPAP at night. CTA negative for PE or pneumonia or pulmonary edema.    Acute on chronic diastolic CHF (congestive heart failure) ([name]) S/p treatment with IV Lasix.  Continue torsemide and spironolactone   Norovirus diarrhea Acute GI bleeding Gary [name] had maroon-colored stools on 05/16/2023.  Bloody stools appear to have resolved. Gary [name] still having diarrhea.  Imodium as needed for diarrhea. Gary [name] was evaluated by Dr. Servando Snare, gastroenterologist,.  No plan for endoscopic workup at this time.  Prior EGD in October 2024 showed hiatal hernia and Barrett's esophagus. Prior colonoscopy in May 2024 showed "hemorrhoids, diverticuli, random left colon biopsies r/o chronic colitis, hemostasis clip placement at anastomosis site, diverticulum biopsies r/o dysplasia".   Acute blood loss anemia, anemia of chronic disease H&H is stable.  Hemoglobin up from 8.8-9.1.  No indication for blood transfusion at this time.   Hypokalemia Continue potassium repletion.   CKD stage IIIa. Creatinine is stable.    Anxiety Continue Lexapro, Atarax PRN    History of DVT but no longer on Eliquis. Use SCDs for DVT prophylaxis.  No pharmacologic DVT prophylaxis for now because of acute GI bleeding  Discussed discharge plans.  Gary [name] said Gary [name] still having diarrhea.  Gary [name] lives alone and Gary [name] does not want to deal with diarrhea at home alone.  Possible discharge to home tomorrow.   Diet Order  Diet Heart Room service appropriate? Yes; Fluid consistency: Thin  Diet effective now                            Consultants: Environmental manager  Procedures: None    Medications:    amphetamine-dextroamphetamine  30 mg Oral Q breakfast   escitalopram  10 mg Oral Daily   mesalamine  1.2 g Oral BID   pantoprazole  40 mg Oral QHS   potassium chloride  40 mEq Oral Daily   [START ON 05/19/2023] predniSONE  40  mg Oral Q breakfast   spironolactone  25 mg Oral Daily   torsemide  40 mg Oral BID   Continuous Infusions:     Anti-infectives (From admission, onward)    Start     Dose/Rate Route Frequency Ordered Stop   05/14/23 1630  levofloxacin (LEVAQUIN) IVPB 750 mg  Status:  Discontinued        750 mg 100 mL/hr over 90 Minutes Intravenous Every 24 hours 05/14/23 1623 05/15/23 0919              Family Communication/Anticipated D/C date and plan/Code Status   DVT prophylaxis: Place and maintain sequential compression device Start: 05/17/23 1034     Code Status: Full Code  Family Communication: None Disposition Plan: Plan to discharge home   Status is: Inpatient Remains inpatient appropriate because: Acute respiratory failure         Subjective:   Gary [name] complains of diarrhea but no bloody stools.  Gary [name] said Gary [name] does not want to go home today and have to do with diarrhea on his own.  No shortness of breath or chest pain.  Objective:    Vitals:   05/17/23 1018 05/17/23 1630 05/17/23 2037 05/18/23 0810  BP: 108/71 119/68 109/62 112/68  Pulse:  83 83 91  Resp:  16 (!) 22 20  Temp:   98.1 F (36.7 C) 98.1 F (36.7 C)  TempSrc:    Oral  SpO2:  96% 92% 96%  Weight:      Height:       No data found.   Intake/Output Summary (Last 24 hours) at 05/18/2023 1340 Last data filed at 05/18/2023 0900 Gross per 24 hour  Intake 720 ml  Output 500 ml  Net 220 ml   Filed Weights   05/14/23 0136 05/15/23 2212  Weight: (!) 199.6 kg (!) 176.9 kg    Exam:  GEN: NAD SKIN: Warm and dry EYES: No pallor or icterus ENT: MMM, capped trach CV: RRR PULM: CTA B ABD: soft, obese, NT, +BS CNS: AAO x 3, non focal EXT: No edema or tenderness      Data Reviewed:   I have personally reviewed following labs and imaging studies:  Labs: Labs show the following:   Basic Metabolic Panel: Recent Labs  Lab 05/14/23 0129 05/15/23 0517 05/16/23 0527 05/17/23 0737 05/18/23 0839   NA 137 138 139 144 140  K 3.5 3.1* 3.2* 3.5 3.5  CL 95* 97* 99 99 94*  CO2 33* 30 33* 35* 35*  GLUCOSE 105* 104* 85 135* 143*  BUN 22 32* 38* 32* 36*  CREATININE 1.36* 1.51* 1.68* 1.55* 1.63*  CALCIUM 8.0* 7.8* 7.7* 7.6* 7.7*  MG  --  1.8  --  2.0  --   PHOS  --   --   --  2.7  --    GFR Estimated Creatinine Clearance: 78.5 mL/min (A) (by C-G formula  based on SCr of 1.63 mg/dL (H)). Liver Function Tests: Recent Labs  Lab 05/14/23 0129  AST 13*  ALT 11  ALKPHOS 92  BILITOT 0.9  PROT 8.8*  ALBUMIN 2.9*   No results for input(s): "LIPASE", "AMYLASE" in the last 168 hours. No results for input(s): "AMMONIA" in the last 168 hours. Coagulation profile No results for input(s): "INR", "PROTIME" in the last 168 hours.  CBC: Recent Labs  Lab 05/14/23 0129 05/16/23 1212 05/16/23 1751 05/17/23 0737 05/18/23 0839  WBC 7.5 5.5  --  5.4  --   NEUTROABS 6.4 4.5  --  3.5  --   HGB 9.8* 8.7* 8.8* 8.8* 9.1*  HCT 38.7* 32.6* 33.4* 33.1* 35.6*  MCV 78.3* 75.3*  --  74.4*  --   PLT 205 195  --  197  --    Cardiac Enzymes: No results for input(s): "CKTOTAL", "CKMB", "CKMBINDEX", "TROPONINI" in the last 168 hours. BNP (last 3 results) No results for input(s): "PROBNP" in the last 8760 hours. CBG: No results for input(s): "GLUCAP" in the last 168 hours. D-Dimer: No results for input(s): "DDIMER" in the last 72 hours. Hgb A1c: No results for input(s): "HGBA1C" in the last 72 hours. Lipid Profile: No results for input(s): "CHOL", "HDL", "LDLCALC", "TRIG", "CHOLHDL", "LDLDIRECT" in the last 72 hours. Thyroid function studies: No results for input(s): "TSH", "T4TOTAL", "T3FREE", "THYROIDAB" in the last 72 hours.  Invalid input(s): "FREET3" Anemia work up: No results for input(s): "VITAMINB12", "FOLATE", "FERRITIN", "TIBC", "IRON", "RETICCTPCT" in the last 72 hours. Sepsis Labs: Recent Labs  Lab 05/14/23 0129 05/14/23 0308 05/16/23 1212 05/17/23 0737  WBC 7.5  --  5.5 5.4   LATICACIDVEN 0.9 0.8  --   --     Microbiology Recent Results (from the past 240 hours)  Culture, blood (routine x 2)     Status: None (Preliminary result)   Collection Time: 05/14/23  1:29 AM   Specimen: BLOOD  Result Value Ref Range Status   Specimen Description BLOOD  Final   Special Requests   Final    BOTTLES DRAWN AEROBIC AND ANAEROBIC Blood Culture adequate volume   Culture   Final    NO GROWTH 4 DAYS Performed at [facility], [address] [place], [place] [address]    Report Status PENDING  Incomplete  Culture, blood (routine x 2)     Status: None (Preliminary result)   Collection Time: 05/14/23  3:08 AM   Specimen: BLOOD  Result Value Ref Range Status   Specimen Description BLOOD RIGHT AC  Final   Special Requests   Final    BOTTLES DRAWN AEROBIC AND ANAEROBIC Blood Culture results may not be optimal due to an inadequate volume of blood received in culture bottles   Culture   Final    NO GROWTH 4 DAYS Performed at [facility], [address], [place], [place] [address]    Report Status PENDING  Incomplete  Resp panel by RT-PCR (RSV, Flu A&B, Covid) Anterior Nasal Swab     Status: None   Collection Time: 05/14/23  3:08 AM   Specimen: Anterior Nasal Swab  Result Value Ref Range Status   SARS Coronavirus 2 by RT PCR NEGATIVE NEGATIVE Final    Comment: (NOTE) SARS-CoV-2 target nucleic acids are NOT DETECTED.  The SARS-CoV-2 RNA is generally detectable in upper respiratory specimens during the acute phase of infection. The lowest concentration of SARS-CoV-2 viral copies this assay can detect is 138 copies/mL. A negative  result does not preclude SARS-Cov-2 infection and should not be used as the sole basis for treatment or other Gary [name] management decisions. A negative result may occur with  improper specimen collection/handling, submission of specimen other than nasopharyngeal swab, presence of viral mutation(s) within the areas targeted by  this assay, and inadequate number of viral copies(<138 copies/mL). A negative result must be combined with clinical observations, Gary [name] history, and epidemiological information. The expected result is Negative.  Fact Sheet for Patients:  BloggerCourse.com  Fact Sheet for Healthcare Providers:  SeriousBroker.it  This test is no t yet approved or cleared by the [place] FDA and  has been authorized for detection and/or diagnosis of SARS-CoV-2 by FDA under an Emergency Use Authorization (EUA). This EUA will remain  in effect (meaning this test can be used) for the duration of the COVID-19 declaration under Section 564(b)(1) of the Act, 21 U.S.C.section 360bbb-3(b)(1), unless the authorization is terminated  or revoked sooner.       Influenza A by PCR NEGATIVE NEGATIVE Final   Influenza B by PCR NEGATIVE NEGATIVE Final    Comment: (NOTE) The Xpert Xpress SARS-CoV-2/FLU/RSV plus assay is intended as an aid in the diagnosis of influenza from Nasopharyngeal swab specimens and should not be used as a sole basis for treatment. Nasal washings and aspirates are unacceptable for Xpert Xpress SARS-CoV-2/FLU/RSV testing.  Fact Sheet for Patients: BloggerCourse.com  Fact Sheet for Healthcare Providers: SeriousBroker.it  This test is not yet approved or cleared by the [place] FDA and has been authorized for detection and/or diagnosis of SARS-CoV-2 by FDA under an Emergency Use Authorization (EUA). This EUA will remain in effect (meaning this test can be used) for the duration of the COVID-19 declaration under Section 564(b)(1) of the Act, 21 U.S.C. section 360bbb-3(b)(1), unless the authorization is terminated or revoked.     Resp Syncytial Virus by PCR NEGATIVE NEGATIVE Final    Comment: (NOTE) Fact Sheet for Patients: BloggerCourse.com  Fact Sheet  for Healthcare Providers: SeriousBroker.it  This test is not yet approved or cleared by the [place] FDA and has been authorized for detection and/or diagnosis of SARS-CoV-2 by FDA under an Emergency Use Authorization (EUA). This EUA will remain in effect (meaning this test can be used) for the duration of the COVID-19 declaration under Section 564(b)(1) of the Act, 21 U.S.C. section 360bbb-3(b)(1), unless the authorization is terminated or revoked.  Performed at [facility], [address] [place], [place] [address]   Respiratory (~20 pathogens) panel by PCR     Status: None   Collection Time: 05/14/23  6:54 AM   Specimen: Nasopharyngeal Swab; Respiratory  Result Value Ref Range Status   Adenovirus NOT DETECTED NOT DETECTED Final   Coronavirus 229E NOT DETECTED NOT DETECTED Final    Comment: (NOTE) The Coronavirus on the Respiratory Panel, DOES NOT test for the novel  Coronavirus (2019 nCoV)    Coronavirus HKU1 NOT DETECTED NOT DETECTED Final   Coronavirus NL63 NOT DETECTED NOT DETECTED Final   Coronavirus OC43 NOT DETECTED NOT DETECTED Final   Metapneumovirus NOT DETECTED NOT DETECTED Final   Rhinovirus / Enterovirus NOT DETECTED NOT DETECTED Final   Influenza A NOT DETECTED NOT DETECTED Final   Influenza B NOT DETECTED NOT DETECTED Final   Parainfluenza Virus 1 NOT DETECTED NOT DETECTED Final   Parainfluenza Virus 2 NOT DETECTED NOT DETECTED Final   Parainfluenza Virus 3 NOT DETECTED NOT DETECTED Final   Parainfluenza Virus 4 NOT DETECTED NOT DETECTED  Final   Respiratory Syncytial Virus NOT DETECTED NOT DETECTED Final   Bordetella pertussis NOT DETECTED NOT DETECTED Final   Bordetella Parapertussis NOT DETECTED NOT DETECTED Final   Chlamydophila pneumoniae NOT DETECTED NOT DETECTED Final   Mycoplasma pneumoniae NOT DETECTED NOT DETECTED Final    Comment: Performed at [facility], [address], [place], [place] [address]  C  Difficile Quick Screen w PCR reflex     Status: None   Collection Time: 05/15/23 12:19 AM   Specimen: STOOL  Result Value Ref Range Status   C Diff antigen NEGATIVE NEGATIVE Final   C Diff toxin NEGATIVE NEGATIVE Final   C Diff interpretation No C. difficile detected.  Final    Comment: Performed at [facility], [address] [place], [place] [address]  Gastrointestinal Panel by PCR , Stool     Status: Abnormal   Collection Time: 05/15/23 12:19 AM   Specimen: STOOL  Result Value Ref Range Status   Campylobacter species NOT DETECTED NOT DETECTED Final   Plesimonas shigelloides NOT DETECTED NOT DETECTED Final   Salmonella species NOT DETECTED NOT DETECTED Final   Yersinia enterocolitica NOT DETECTED NOT DETECTED Final   Vibrio species NOT DETECTED NOT DETECTED Final   Vibrio cholerae NOT DETECTED NOT DETECTED Final   Enteroaggregative E coli (EAEC) NOT DETECTED NOT DETECTED Final   Enteropathogenic E coli (EPEC) NOT DETECTED NOT DETECTED Final   Enterotoxigenic E coli (ETEC) NOT DETECTED NOT DETECTED Final   Shiga like toxin producing E coli (STEC) NOT DETECTED NOT DETECTED Final   Shigella/Enteroinvasive E coli (EIEC) NOT DETECTED NOT DETECTED Final   Cryptosporidium NOT DETECTED NOT DETECTED Final   Cyclospora cayetanensis NOT DETECTED NOT DETECTED Final   Entamoeba histolytica NOT DETECTED NOT DETECTED Final   Giardia lamblia NOT DETECTED NOT DETECTED Final   Adenovirus F40/41 NOT DETECTED NOT DETECTED Final   Astrovirus NOT DETECTED NOT DETECTED Final   Norovirus GI/GII DETECTED (A) NOT DETECTED Final    Comment: RESULT CALLED TO, READ BACK BY AND VERIFIED WITH: PATRICKWARRICK 05/15/23 0981 MW    Rotavirus A NOT DETECTED NOT DETECTED Final   Sapovirus (I, II, IV, and V) NOT DETECTED NOT DETECTED Final    Comment: Performed at [facility], [address] [place], [place] [address]    Procedures and diagnostic studies:  No results  found.              LOS: 3 days   Gregory Dowe  [facility]   Pager on [email or web address]. If 7PM-7AM, please contact night-coverage at [email or web address]     05/18/2023, 1:40 PM

## 2023-05-18 NOTE — Progress Notes (Addendum)
Patient keeps complaining about his door having to be closed. Educated patient on why we have to keep the door closed. Patient said he needed the air flow. So, I placed a fan in room for patient and placed humidity on his oxygen. Patient said that he thinks its something personal against him. Though again explained to patient why.

## 2023-05-18 NOTE — Plan of Care (Signed)
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
## 2023-05-19 DIAGNOSIS — J9622 Acute and chronic respiratory failure with hypercapnia: Secondary | ICD-10-CM | POA: Diagnosis not present

## 2023-05-19 DIAGNOSIS — J9621 Acute and chronic respiratory failure with hypoxia: Secondary | ICD-10-CM | POA: Diagnosis not present

## 2023-05-19 LAB — CULTURE, BLOOD (ROUTINE X 2)
Culture: NO GROWTH
Culture: NO GROWTH
Special Requests: ADEQUATE

## 2023-05-19 MED ORDER — PREDNISONE 5 MG PO TABS: ORAL_TABLET | ORAL | 0 refills | Status: DC

## 2023-05-19 MED ORDER — OXYCODONE HCL 5 MG PO TABS: 5.0000 mg | ORAL_TABLET | Freq: Two times a day (BID) | ORAL | 0 refills | Status: DC | PRN

## 2023-05-19 NOTE — TOC Progression Note (Addendum)
Transition of Care [facility]) - Progression Note    Patient Details  Name: NIKE SOUTHWELL MRN: [id number] Date of Birth: 1956/03/04  [facility] of [facility]) CM/SW Contact  [name], RN Phone Number: 05/19/2023, 4:49 PM  Clinical Narrative:  05/19/23: Potential discharge pending HH set up and arranging transportation via ambulance. Unclear if patient has established a PCP. Attempting to confirm with [facility] as patient states he has a primary care agency coming his that well help with HH orders. PCP listed in chart is Luna Kitchens, but unclear if primary or a discharging provider that ordered [facility] in August 2024. Patient is unable to tell RN CM the name of agency and no clear information from [facility] or Care Everywhere.  Pending an answer from [facility]. EMS forms printed to [employer]. Updated care staff.   Patient now not leaving till the am, but needs to be at home by 1100.   After looking at patient benefit website for [employer], it appears this [employer] is coming from benefits.   Call Provider Services to verify a Devoted member: [phone] to see if they will cover HH orders.   Gabriel Cirri MSN RN CM  RN Case Manager [place]  Transitions of Care Direct Dial: [phone] (Weekends Only) [facility] Main Office Phone: [phone] [facility] Fax: [phone] [email or web address]          Expected Discharge Plan and Services         Expected Discharge Date: 05/19/23                                     Social Determinants of Health (SDOH) Interventions SDOH Screenings   Food Insecurity: No Food Insecurity (05/15/2023)  Housing: Low Risk  (05/15/2023)  Transportation Needs: No Transportation Needs (05/15/2023)  Utilities: Not At Risk (05/15/2023)  Depression (PHQ2-9): Low Risk  (06/07/2021)  Financial Resource Strain: High Risk (11/21/2022)   Received from [facility]  Physical Activity: Insufficiently Active (05/03/2017)  Social Connections: Moderately  Isolated (05/15/2023)  Stress: Stress Concern Present (11/20/2022)   Received from [facility]  Tobacco Use: Medium Risk (05/14/2023)    Readmission Risk Interventions    03/19/2023    9:58 AM 11/05/2022    2:59 PM 11/05/2022   12:16 PM  Readmission Risk Prevention Plan  Transportation Screening Complete Complete Complete  Medication Review Ocean[employer]) Complete Complete Complete  PCP or Specialist appointment within 3-5 days of discharge Complete Complete Complete  HRI or Home Care Consult Complete Complete Complete  SW Recovery Care/Counseling Consult Complete Complete Complete  Palliative Care Screening Complete Complete Complete  Skilled Nursing Facility Not Applicable Not Applicable Not Applicable

## 2023-05-19 NOTE — Plan of Care (Signed)

## 2023-05-19 NOTE — TOC Progression Note (Incomplete Revision)
Transition of Care [facility]) - Progression Note    Patient Details  Name: Gary [name] MRN: [id number] Date of Birth: [birth year]  Transition of Care [facility]) CM/SW Contact  [name], RN Phone Number: 05/19/2023, 4:49 PM  Clinical Narrative:  05/19/23: Potential discharge pending HH set up and arranging transportation via ambulance. Unclear if patient has established a PCP. Attempting to confirm with [facility] as patient states he has a primary care agency coming his that well help with HH orders.   PCP listed in chart is Luna Kitchens, but unclear if primary or a discharging provider that ordered [facility] in August 2024. Patient is unable to tell RN CM the name of agency and no clear information from [facility] or Care Everywhere.  Pending an answer from [facility]. EMS forms printed to [employer]. Updated care staff.   UPDATE:  530 pm. Patient now not leaving till the am, but needs to be at home by 1100.   After looking at patient benefit website for [employer], it appears this [employer] is coming from benefits. Gave [facility] this information as well via [place].   Call Provider Services to verify a Devoted member: [phone] to see if they will cover HH orders.   Gabriel Cirri MSN RN CM  RN Case Manager [place]  Transitions of Care Direct Dial: [phone] (Weekends Only) [facility] Main Office Phone: [phone] [facility] Fax: [phone] [email or web address]          Expected Discharge Plan and Services         Expected Discharge Date: 05/19/23                                     Social Determinants of Health (SDOH) Interventions SDOH Screenings   Food Insecurity: No Food Insecurity (05/15/2023)  Housing: Low Risk  (05/15/2023)  Transportation Needs: No Transportation Needs (05/15/2023)  Utilities: Not At Risk (05/15/2023)  Depression (PHQ2-9): Low Risk  (06/07/2021)  Financial Resource Strain: High Risk (11/21/2022)   Received from [facility]  Physical  Activity: Insufficiently Active (05/03/2017)  Social Connections: Moderately Isolated (05/15/2023)  Stress: Stress Concern Present (11/20/2022)   Received from [facility]  Tobacco Use: Medium Risk (05/14/2023)    Readmission Risk Interventions    03/19/2023    9:58 AM 11/05/2022    2:59 PM 11/05/2022   12:16 PM  Readmission Risk Prevention Plan  Transportation Screening Complete Complete Complete  Medication Review Oceanographer) Complete Complete Complete  PCP or Specialist appointment within 3-5 days of discharge Complete Complete Complete  HRI or Home Care Consult Complete Complete Complete  SW Recovery Care/Counseling Consult Complete Complete Complete  Palliative Care Screening Complete Complete Complete  Skilled Nursing Facility Not Applicable Not Applicable Not Applicable

## 2023-05-19 NOTE — Discharge Summary (Signed)
Physician Discharge Summary   Patient: Gary [name] MRN: [id number] DOB: 1955/12/23  Admit date:     05/14/2023  Discharge date: 05/19/23  Discharge Physician: Lurene Shadow   PCP: Shayne Alken, MD   Recommendations at discharge:   Follow-up with PCP in 1 to 2 weeks Follow-up with Dr. Karna Christmas, pulmonologist, as scheduled, within 1 month of discharge  Discharge Diagnoses: Principal Problem:   Acute on chronic respiratory failure with hypoxia and hypercapnia ([name]) Active Problems:   (HFpEF) heart failure with preserved ejection fraction ([name])   Chronic obstructive pulmonary disease (COPD) ([name])   Acute GI bleeding   Rectal bleeding   Diarrhea  Resolved Problems:   * No resolved hospital problems. *  Hospital Course:  Gary [name] is a 68 y.o. 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  EMS found him hypoxic with oxygen saturation in the 80s which improved with oxygenation via nonrebreather mask and trach suctioning en route to the ED. Of note, pt was discharged home with home health services on 04/22/2023 after being admitted on 11/28/2022 (a nearly 5 month admission).  Pt is well known to this hospital with 14 admissions in the past two years alone, often spending months in hospital while medically stable due to placement difficulties and/or pt refusal for placement.  Pt was discharged home with home health services last time.    He required BiPAP in the ED for acute hypoxemic respiratory failure.    Assessment and Plan:  Acute on chronic respiratory failure with hypoxia and hypercapnea  --he is off of BiPAP.  Oxygen therapy has been weaned down from 10 L to 4 L/min. He said he was  using 2 L/min oxygen at home but there are times where he bumped it up to 3 L/min oxygen.  He is comfortable going home with 4 L/min oxygen. Continue CPAP at night S/p tracheostomy status.  Janina Mayo is capped. Follow-up with pulmonologist     Severe COPD, OSA Probable COPD exacerbation.   Patient will be discharged on prednisone taper from 35 mg daily, tapering down by 5 mg every day, down to 5 mg daily ( Per Dr. Terence Lux recommendation). Continue bronchodilators at discharge. CTA negative for PE or pneumonia or pulmonary edema.     Acute on chronic diastolic CHF (congestive heart failure) ([name]) S/p treatment with IV Lasix.  Continue torsemide and spironolactone     Norovirus diarrhea Acute GI bleeding Resolved. He was evaluated by Dr. Servando Snare, gastroenterologist,.  No plan for endoscopic workup   Prior EGD in October 2024 showed hiatal hernia and Barrett's esophagus. Prior colonoscopy in May 2024 showed "hemorrhoids, diverticuli, random left colon biopsies r/o chronic colitis, hemostasis clip placement at anastomosis site, diverticulum biopsies r/o dysplasia".     Acute blood loss anemia, anemia of chronic disease H&H is stable.  Hemoglobin up from 8.8-9.1.  No indication for blood transfusion at this time.     Hypokalemia Improved     CKD stage IIIa. Creatinine is stable.     Anxiety Continue Lexapro, Atarax PRN       History of DVT but no longer on Eliquis. Use SCDs for DVT prophylaxis.  No pharmacologic DVT prophylaxis for now because of acute GI bleeding  His condition has improved.  Patient said he wants to go home today.  He said he is expecting " a nurse".  He is deemed stable for discharge.       Consultants: Pulmonologist, gastroenterologist Procedures performed: None Disposition: Home Diet recommendation:  Discharge Diet Orders (From admission, onward)     Start     Ordered   05/19/23 0000  Diet - low sodium heart healthy        05/19/23 1404            Cardiac diet DISCHARGE MEDICATION: Allergies as of 05/19/2023   No Known Allergies      Medication List     TAKE these medications    acetaminophen 325 MG tablet Commonly known as: TYLENOL Take 2 tablets (650 mg total) by mouth every 6 (six) hours as needed for mild pain (pain score 1-3) (or Fever >/= 101).   amphetamine-dextroamphetamine 30 MG tablet Commonly known as: ADDERALL Take 1 tablet by mouth daily with breakfast.   bacitracin 500 UNIT/GM ointment Apply to trach site daily.   bisacodyl 10 MG suppository Commonly known as: DULCOLAX Place 1 suppository (10 mg total) rectally daily as needed for moderate constipation or mild constipation.   Clotrimazole Anti-Fungal 1 % cream Generic drug: clotrimazole Apply topically 2 (two) times daily.   Constulose 10 GM/15ML solution Generic drug: lactulose Take 45 mLs (30 g total) by mouth daily as needed for mild constipation.   escitalopram 10 MG tablet Commonly known as: LEXAPRO Take 1 tablet (10 mg total) by mouth daily.   Gerhardt's butt cream Crea Apply 1 Application topically 2 (two) times daily.   hydrOXYzine 25 MG tablet Commonly known as: ATARAX Take 1 tablet (25 mg total) by mouth 3 (three) times daily as needed for anxiety.   ipratropium-albuterol 0.5-2.5 (3) MG/3ML Soln Commonly known as: DUONEB Take 3 mLs by nebulization every 6 (six) hours as needed.   Combivent Respimat 20-100 MCG/ACT Aers respimat Generic drug: Ipratropium-Albuterol Inhale 1 puff into the lungs every 6 (six) hours.   mesalamine 1.2 g EC tablet Commonly known as: LIALDA Take 1 tablet (1.2 g total) by mouth 2 (two) times daily.   oxyCODONE 5 MG immediate release tablet Commonly known as: Oxy IR/ROXICODONE Take 1 tablet (5 mg total) by mouth 2 (two) times daily as needed for moderate pain (pain score 4-6) or severe pain (pain score 7-10).   pantoprazole 40 MG tablet Commonly known as: PROTONIX Take 1 tablet (40 mg  total) by mouth at bedtime.   polyvinyl alcohol 1.4 % ophthalmic solution Commonly known as: LIQUIFILM TEARS Place 1 drop into both eyes as needed for dry eyes.   predniSONE 5 MG tablet Commonly known as: DELTASONE Take 7 tablets (35 mg total) by mouth daily with breakfast for 1 day, THEN 6 tablets (30 mg total) daily with breakfast for 1 day, THEN 5 tablets (25 mg total) daily with breakfast for 1 day, THEN 4 tablets (20 mg total) daily with breakfast for 1 day, THEN 3 tablets (15 mg total) daily with breakfast for 1 day, THEN 2 tablets (10 mg total) daily with breakfast for 1 day, THEN 1 tablet (5 mg total) daily with breakfast for 1 day. Start taking on: May 20, 2023   spironolactone 25 MG tablet Commonly known as: ALDACTONE Take 1 tablet (25 mg total) by mouth daily.   Stimulant Laxative 8.6-50 MG tablet Generic drug: senna-docusate Take 2 tablets by mouth at bedtime.   torsemide 20  MG tablet Commonly known as: DEMADEX Take 2 tablets (40 mg total) by mouth 2 (two) times daily.   traZODone 50 MG tablet Commonly known as: DESYREL Take 1 tablet (50 mg total) by mouth at bedtime as needed for sleep.   trolamine salicylate 10 % cream Commonly known as: ASPERCREME Apply topically as needed for muscle pain.        Discharge Exam: Filed Weights   05/14/23 0136 05/15/23 2212  Weight: (!) 199.6 kg (!) 176.9 kg   GEN: NAD SKIN: Warm and dry EYES: No pallor or icterus ENT: MMM CV: RRR PULM: CTA B ABD: soft, obese, NT, +BS CNS: AAO x 3, non focal EXT: No edema or tenderness   Condition at discharge: good  The results of significant diagnostics from this hospitalization (including imaging, microbiology, ancillary and laboratory) are listed below for reference.   Imaging Studies: CT Angio Chest PE W/Cm &/Or Wo Cm Result Date: 05/14/2023 CLINICAL DATA:  Pulmonary embolism suspected.  High probability EXAM: CT ANGIOGRAPHY CHEST WITH CONTRAST TECHNIQUE: Multidetector CT  imaging of the chest was performed using the standard protocol during bolus administration of intravenous contrast. Multiplanar CT image reconstructions and MIPs were obtained to evaluate the vascular anatomy. RADIATION DOSE REDUCTION: This exam was performed according to the departmental dose-optimization program which includes automated exposure control, adjustment of the mA and/or kV according to patient size and/or use of iterative reconstruction technique. CONTRAST:  75mL OMNIPAQUE IOHEXOL 350 MG/ML SOLN COMPARISON:  None Available. FINDINGS: Cardiovascular: Images degraded by body motion and body habitus. With this caveat, no filling defects within pulmonary arteries to suggest acute pulmonary embolism. Segmental pulmonary arteries are poorly evaluated. Mediastinum/Nodes: No axillary or supraclavicular adenopathy. No mediastinal or hilar adenopathy. No pericardial fluid. Esophagus normal. Tracheostomy tube in good position. Lungs/Pleura: No pulmonary infarction. No pneumonia. No pleural fluid. No pneumothorax Bibasilar atelectasis. Upper Abdomen: Limited view of the liver, kidneys, pancreas are unremarkable. Normal adrenal glands. Musculoskeletal: Healed posterior LEFT rib fractures. No acute findings Review of the MIP images confirms the above findings. IMPRESSION: 1. No evidence acute pulmonary embolism. Suboptimal exam due to body habitus and patient motion. 2. Bibasilar atelectasis.  No pulmonary infarction or pneumonia. Electronically Signed   By: Genevive Bi M.D.   On: 05/14/2023 10:32   DG Chest Port 1 View Result Date: 05/14/2023 CLINICAL DATA:  Patient with SOB, cough, and weakness. Hx of COPD, tracheostomy, and former smoker. EXAM: PORTABLE CHEST 1 VIEW COMPARISON:  Chest x-ray 03/26/2023, CT abdomen pelvis 09/07/2022, chest x-ray 02/17/2023 FINDINGS: Tracheostomy terminates 4 cm above the carina. Persistent cardiomegaly. The heart and mediastinal contours are unchanged. Prominent hilar  vasculature. Low lung volumes. Left mid lung zone linear atelectasis versus airspace opacity. No focal consolidation. No pulmonary edema. No pleural effusion. No pneumothorax. No acute osseous abnormality. IMPRESSION: 1. Low lung volumes with venous pulmonary congestion. 2. Cardiomegaly with underlying pericardial effusion not excluded. Electronically Signed   By: Tish Frederickson M.D.   On: 05/14/2023 02:37    Microbiology: Results for orders placed or performed during the hospital encounter of 05/14/23  Culture, blood (routine x 2)     Status: None   Collection Time: 05/14/23  1:29 AM   Specimen: BLOOD  Result Value Ref Range Status   Specimen Description BLOOD  Final   Special Requests   Final    BOTTLES DRAWN AEROBIC AND ANAEROBIC Blood Culture adequate volume   Culture   Final    NO GROWTH 5 DAYS Performed at  [facility], [address] [place], [place] [address]    Report Status 05/19/2023 FINAL  Final  Culture, blood (routine x 2)     Status: None   Collection Time: 05/14/23  3:08 AM   Specimen: BLOOD  Result Value Ref Range Status   Specimen Description BLOOD RIGHT AC  Final   Special Requests   Final    BOTTLES DRAWN AEROBIC AND ANAEROBIC Blood Culture results may not be optimal due to an inadequate volume of blood received in culture bottles   Culture   Final    NO GROWTH 5 DAYS Performed at [facility], [address] [place], [place] [address]    Report Status 05/19/2023 FINAL  Final  Resp panel by RT-PCR (RSV, Flu A&B, Covid) Anterior Nasal Swab     Status: None   Collection Time: 05/14/23  3:08 AM   Specimen: Anterior Nasal Swab  Result Value Ref Range Status   SARS Coronavirus 2 by RT PCR NEGATIVE NEGATIVE Final    Comment: (NOTE) SARS-CoV-2 target nucleic acids are NOT DETECTED.  The SARS-CoV-2 RNA is generally detectable in upper respiratory specimens during the acute phase of infection. The lowest concentration of SARS-CoV-2 viral copies  this assay can detect is 138 copies/mL. A negative result does not preclude SARS-Cov-2 infection and should not be used as the sole basis for treatment or other patient management decisions. A negative result may occur with  improper specimen collection/handling, submission of specimen other than nasopharyngeal swab, presence of viral mutation(s) within the areas targeted by this assay, and inadequate number of viral copies(<138 copies/mL). A negative result must be combined with clinical observations, patient history, and epidemiological information. The expected result is Negative.  Fact Sheet for Patients:  BloggerCourse.com  Fact Sheet for Healthcare Providers:  SeriousBroker.it  This test is no t yet approved or cleared by the [place] FDA and  has been authorized for detection and/or diagnosis of SARS-CoV-2 by FDA under an Emergency Use Authorization (EUA). This EUA will remain  in effect (meaning this test can be used) for the duration of the COVID-19 declaration under Section 564(b)(1) of the Act, 21 U.S.C.section 360bbb-3(b)(1), unless the authorization is terminated  or revoked sooner.       Influenza A by PCR NEGATIVE NEGATIVE Final   Influenza B by PCR NEGATIVE NEGATIVE Final    Comment: (NOTE) The Xpert Xpress SARS-CoV-2/FLU/RSV plus assay is intended as an aid in the diagnosis of influenza from Nasopharyngeal swab specimens and should not be used as a sole basis for treatment. Nasal washings and aspirates are unacceptable for Xpert Xpress SARS-CoV-2/FLU/RSV testing.  Fact Sheet for Patients: BloggerCourse.com  Fact Sheet for Healthcare Providers: SeriousBroker.it  This test is not yet approved or cleared by the [place] FDA and has been authorized for detection and/or diagnosis of SARS-CoV-2 by FDA under an Emergency Use Authorization (EUA). This EUA  will remain in effect (meaning this test can be used) for the duration of the COVID-19 declaration under Section 564(b)(1) of the Act, 21 U.S.C. section 360bbb-3(b)(1), unless the authorization is terminated or revoked.     Resp Syncytial Virus by PCR NEGATIVE NEGATIVE Final    Comment: (NOTE) Fact Sheet for Patients: BloggerCourse.com  Fact Sheet for Healthcare Providers: SeriousBroker.it  This test is not yet approved or cleared by the [place] FDA and has been authorized for detection and/or diagnosis of SARS-CoV-2 by FDA under an Emergency Use Authorization (EUA). This EUA will remain in  effect (meaning this test can be used) for the duration of the COVID-19 declaration under Section 564(b)(1) of the Act, 21 U.S.C. section 360bbb-3(b)(1), unless the authorization is terminated or revoked.  Performed at [facility], [address] [place], [place] [address]   Respiratory (~20 pathogens) panel by PCR     Status: None   Collection Time: 05/14/23  6:54 AM   Specimen: Nasopharyngeal Swab; Respiratory  Result Value Ref Range Status   Adenovirus NOT DETECTED NOT DETECTED Final   Coronavirus 229E NOT DETECTED NOT DETECTED Final    Comment: (NOTE) The Coronavirus on the Respiratory Panel, DOES NOT test for the novel  Coronavirus (2019 nCoV)    Coronavirus HKU1 NOT DETECTED NOT DETECTED Final   Coronavirus NL63 NOT DETECTED NOT DETECTED Final   Coronavirus OC43 NOT DETECTED NOT DETECTED Final   Metapneumovirus NOT DETECTED NOT DETECTED Final   Rhinovirus / Enterovirus NOT DETECTED NOT DETECTED Final   Influenza A NOT DETECTED NOT DETECTED Final   Influenza B NOT DETECTED NOT DETECTED Final   Parainfluenza Virus 1 NOT DETECTED NOT DETECTED Final   Parainfluenza Virus 2 NOT DETECTED NOT DETECTED Final   Parainfluenza Virus 3 NOT DETECTED NOT DETECTED Final   Parainfluenza Virus 4 NOT DETECTED NOT DETECTED Final    Respiratory Syncytial Virus NOT DETECTED NOT DETECTED Final   Bordetella pertussis NOT DETECTED NOT DETECTED Final   Bordetella Parapertussis NOT DETECTED NOT DETECTED Final   Chlamydophila pneumoniae NOT DETECTED NOT DETECTED Final   Mycoplasma pneumoniae NOT DETECTED NOT DETECTED Final    Comment: Performed at [facility], [address], [place], [place] [address]  C Difficile Quick Screen w PCR reflex     Status: None   Collection Time: 05/15/23 12:19 AM   Specimen: STOOL  Result Value Ref Range Status   C Diff antigen NEGATIVE NEGATIVE Final   C Diff toxin NEGATIVE NEGATIVE Final   C Diff interpretation No C. difficile detected.  Final    Comment: Performed at [facility], [address] [place], [place] [address]  Gastrointestinal Panel by PCR , Stool     Status: Abnormal   Collection Time: 05/15/23 12:19 AM   Specimen: STOOL  Result Value Ref Range Status   Campylobacter species NOT DETECTED NOT DETECTED Final   Plesimonas shigelloides NOT DETECTED NOT DETECTED Final   Salmonella species NOT DETECTED NOT DETECTED Final   Yersinia enterocolitica NOT DETECTED NOT DETECTED Final   Vibrio species NOT DETECTED NOT DETECTED Final   Vibrio cholerae NOT DETECTED NOT DETECTED Final   Enteroaggregative E coli (EAEC) NOT DETECTED NOT DETECTED Final   Enteropathogenic E coli (EPEC) NOT DETECTED NOT DETECTED Final   Enterotoxigenic E coli (ETEC) NOT DETECTED NOT DETECTED Final   Shiga like toxin producing E coli (STEC) NOT DETECTED NOT DETECTED Final   Shigella/Enteroinvasive E coli (EIEC) NOT DETECTED NOT DETECTED Final   Cryptosporidium NOT DETECTED NOT DETECTED Final   Cyclospora cayetanensis NOT DETECTED NOT DETECTED Final   Entamoeba histolytica NOT DETECTED NOT DETECTED Final   Giardia lamblia NOT DETECTED NOT DETECTED Final   Adenovirus F40/41 NOT DETECTED NOT DETECTED Final   Astrovirus NOT DETECTED NOT DETECTED Final   Norovirus GI/GII DETECTED (A) NOT  DETECTED Final    Comment: RESULT CALLED TO, READ BACK BY AND VERIFIED WITH: [email or web address] 05/15/23 9147 MW    Rotavirus A NOT DETECTED NOT DETECTED Final   Sapovirus (I, II, IV, and V) NOT DETECTED NOT  DETECTED Final    Comment: Performed at [facility], [address] [place], [place] [address]    Labs: CBC: Recent Labs  Lab 05/14/23 0129 05/16/23 1212 05/16/23 1751 05/17/23 0737 05/18/23 0839  WBC 7.5 5.5  --  5.4  --   NEUTROABS 6.4 4.5  --  3.5  --   HGB 9.8* 8.7* 8.8* 8.8* 9.1*  HCT 38.7* 32.6* 33.4* 33.1* 35.6*  MCV 78.3* 75.3*  --  74.4*  --   PLT 205 195  --  197  --    Basic Metabolic Panel: Recent Labs  Lab 05/14/23 0129 05/15/23 0517 05/16/23 0527 05/17/23 0737 05/18/23 0839  NA 137 138 139 144 140  K 3.5 3.1* 3.2* 3.5 3.5  CL 95* 97* 99 99 94*  CO2 33* 30 33* 35* 35*  GLUCOSE 105* 104* 85 135* 143*  BUN 22 32* 38* 32* 36*  CREATININE 1.36* 1.51* 1.68* 1.55* 1.63*  CALCIUM 8.0* 7.8* 7.7* 7.6* 7.7*  MG  --  1.8  --  2.0  --   PHOS  --   --   --  2.7  --    Liver Function Tests: Recent Labs  Lab 05/14/23 0129  AST 13*  ALT 11  ALKPHOS 92  BILITOT 0.9  PROT 8.8*  ALBUMIN 2.9*   CBG: No results for input(s): "GLUCAP" in the last 168 hours.  Discharge time spent: greater than 30 minutes.  Signed: Lurene Shadow, MD [facility] 05/19/2023
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
## 2023-05-20 ENCOUNTER — Other Ambulatory Visit: Payer: Self-pay

## 2023-05-20 DIAGNOSIS — R54 Age-related physical debility: Secondary | ICD-10-CM | POA: Diagnosis not present

## 2023-05-20 DIAGNOSIS — Z9981 Dependence on supplemental oxygen: Secondary | ICD-10-CM | POA: Diagnosis not present

## 2023-05-20 DIAGNOSIS — Z9989 Dependence on other enabling machines and devices: Secondary | ICD-10-CM | POA: Diagnosis not present

## 2023-05-20 DIAGNOSIS — G894 Chronic pain syndrome: Secondary | ICD-10-CM | POA: Diagnosis not present

## 2023-05-20 DIAGNOSIS — Z79891 Long term (current) use of opiate analgesic: Secondary | ICD-10-CM | POA: Diagnosis not present

## 2023-05-20 DIAGNOSIS — G4733 Obstructive sleep apnea (adult) (pediatric): Secondary | ICD-10-CM | POA: Diagnosis not present

## 2023-05-20 DIAGNOSIS — J9621 Acute and chronic respiratory failure with hypoxia: Secondary | ICD-10-CM | POA: Diagnosis not present

## 2023-05-20 DIAGNOSIS — J9622 Acute and chronic respiratory failure with hypercapnia: Secondary | ICD-10-CM | POA: Diagnosis not present

## 2023-05-20 DIAGNOSIS — Z930 Tracheostomy status: Secondary | ICD-10-CM | POA: Diagnosis not present

## 2023-05-20 DIAGNOSIS — J9611 Chronic respiratory failure with hypoxia: Secondary | ICD-10-CM | POA: Diagnosis not present

## 2023-05-20 MED ORDER — OXYCODONE HCL 5 MG PO TABS
5.0000 mg | ORAL_TABLET | Freq: Two times a day (BID) | ORAL | 0 refills | Status: DC | PRN
  Filled 2023-05-20 (×2): qty 10, 5d supply, fill #0

## 2023-05-20 MED ORDER — PREDNISONE 5 MG PO TABS
ORAL_TABLET | ORAL | 0 refills | Status: AC
  Filled 2023-05-20 (×2): qty 28, 7d supply, fill #0

## 2023-05-20 NOTE — TOC Transition Note (Signed)
Transition of Care [facility]) - Discharge Note   Patient Details  Name: Gary [name] MRN: [id number] Date of Birth: [birth year]  [facility] CM/SW Contact:  Garret Reddish, RN Phone Number: 05/20/2023, 10:36 AM   Clinical Narrative:    Chart reviewed.  Noted that patient will be a discharge for today.    I have spoken with [name] and he would like to use [facility]  for Home Health services.   I have spoken with [place] from [place].  [facility] has confirmed that they will be able to accept patient for home health services.  [facility] will provide PT/OT services.  Start of Care will be with 24-48 hours.  I have asked Dr. Karna Christmas to sign home care orders and he is agreeable.   Patient is not able to provide [facility] with PCP provider name or agency.    I have arranged [facility] to transport patient home today.    I have informed staff nurse of the above information.      Final next level of care: Home w Home Health Services Barriers to Discharge: No Barriers Identified   Patient Goals and CMS Choice   CMS Medicare.gov Compare Post Acute Care list provided to:: Patient Choice offered to / list presented to : Patient      Discharge Placement                Patient to be transferred to facility by: [facility]   Patient and family notified of of transfer: 05/20/23  Discharge Plan and Services Additional resources added to the After Visit Summary for                            [facility] Arranged: PT, OT [facility] Agency: [facility] Date [facility] Agency Contacted: 05/20/23   Representative spoke with at [facility] Agency: Roxy Manns  Social Drivers of Health (SDOH) Interventions SDOH Screenings   Food Insecurity: No Food Insecurity (05/15/2023)  Housing: Low Risk  (05/15/2023)  Transportation Needs: No Transportation Needs (05/15/2023)  Utilities: Not At Risk (05/15/2023)  Depression (PHQ2-9): Low Risk  (06/07/2021)  Financial Resource Strain:  High Risk (11/21/2022)   Received from [facility]  Physical Activity: Insufficiently Active (05/03/2017)  Social Connections: Moderately Isolated (05/15/2023)  Stress: Stress Concern Present (11/20/2022)   Received from [facility]  Tobacco Use: Medium Risk (05/14/2023)     Readmission Risk Interventions    03/19/2023    9:58 AM 11/05/2022    2:59 PM 11/05/2022   12:16 PM  Readmission Risk Prevention Plan  Transportation Screening Complete Complete Complete  Medication Review Oceanographer) Complete Complete Complete  PCP or Specialist appointment within 3-5 days of discharge Complete Complete Complete  HRI or Home Care Consult Complete Complete Complete  SW Recovery Care/Counseling Consult Complete Complete Complete  Palliative Care Screening Complete Complete Complete  Skilled Nursing Facility Not Applicable Not Applicable Not Applicable

## 2023-05-20 NOTE — Discharge Summary (Signed)
Physician Discharge Summary   Patient: Gary [name] MRN: [id number] DOB: [birth year]  Admit date:     05/14/2023  Discharge date: 05/20/23  Discharge Physician: Lurene Shadow   PCP: Shayne Alken, MD   Recommendations at discharge:   Follow-up with PCP in 1 to 2 weeks Follow-up with Dr. Karna Christmas, pulmonologist, as scheduled, within 1 month of discharge  Discharge Diagnoses: Principal Problem:   Acute on chronic respiratory failure with hypoxia and hypercapnia ([name]) Active Problems:   (HFpEF) heart failure with preserved ejection fraction ([name])   Chronic obstructive pulmonary disease (COPD) ([name])   Acute GI bleeding   Rectal bleeding   Diarrhea  Resolved Problems:   * No resolved hospital problems. *  Hospital Course:  Gary [name] is a 68 y.o. 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  EMS found him hypoxic with oxygen saturation in the 80s which improved with oxygenation via nonrebreather mask and trach suctioning en route to the ED. Of note, pt was discharged home with home health services on 04/22/2023 after being admitted on 11/28/2022 (a nearly 5 month admission).  Pt is well known to this hospital with 14 admissions in the past two years alone, often spending months in hospital while medically stable due to placement difficulties and/or pt refusal for placement.  Pt was discharged home with home health services last time.    He required BiPAP in the ED for acute hypoxemic respiratory failure.      Assessment and Plan:   Acute on chronic respiratory failure with hypoxia and hypercapnea  --he is off of BiPAP.  Oxygen therapy has been weaned down from 3 L to 3 L/min. He said he was  using 2 L/min oxygen at home but there are times when he bumped it up to 3 L/min oxygen.  Continue CPAP at night S/p tracheostomy status.  Janina Mayo is capped. Follow-up with pulmonologist     Severe COPD, OSA Probable COPD exacerbation.   Patient will be discharged on prednisone taper from 35 mg daily, tapering down by 5 mg every day, down to 5 mg daily ( Per Dr. Terence Lux recommendation). Continue bronchodilators at discharge. CTA negative for PE or pneumonia or pulmonary edema.     Acute on chronic diastolic CHF (congestive heart failure) ([name]) S/p treatment with IV Lasix.  Continue torsemide and spironolactone     Norovirus diarrhea Acute GI bleeding Resolved. He was evaluated by Dr. Servando Snare, gastroenterologist,.  No plan for endoscopic workup   Prior EGD in October 2024 showed hiatal hernia and Barrett's esophagus. Prior colonoscopy in May 2024 showed "hemorrhoids, diverticuli, random left colon biopsies r/o chronic colitis, hemostasis clip placement at anastomosis site, diverticulum biopsies r/o dysplasia".     Acute blood loss anemia, anemia of chronic disease H&H is stable.  Hemoglobin up from 8.8-9.1.  No indication for blood transfusion at this time.     Hypokalemia Improved     CKD stage IIIa. Creatinine is stable.     Anxiety Continue Lexapro, Atarax PRN       History of DVT but no longer on Eliquis.        Pain control - [employer] Controlled Substance Reporting System database was reviewed. and patient was instructed, not  to drive, operate heavy machinery, perform activities at heights, swimming or participation in water activities or provide baby-sitting services while on Pain, Sleep and Anxiety Medications; until their outpatient Physician has advised to do so again. Also recommended to not to take more than prescribed Pain, Sleep and Anxiety Medications.  Consultants: Pulmonologist Procedures performed: None Disposition: Home health Diet  recommendation:  Discharge Diet Orders (From admission, onward)     Start     Ordered   05/19/23 0000  Diet - low sodium heart healthy        05/19/23 1404           Cardiac diet DISCHARGE MEDICATION: Allergies as of 05/20/2023   No Known Allergies      Medication List     TAKE these medications    acetaminophen 325 MG tablet Commonly known as: TYLENOL Take 2 tablets (650 mg total) by mouth every 6 (six) hours as needed for mild pain (pain score 1-3) (or Fever >/= 101).   amphetamine-dextroamphetamine 30 MG tablet Commonly known as: ADDERALL Take 1 tablet by mouth daily with breakfast.   bacitracin 500 UNIT/GM ointment Apply to trach site daily.   bisacodyl 10 MG suppository Commonly known as: DULCOLAX Place 1 suppository (10 mg total) rectally daily as needed for moderate constipation or mild constipation.   Clotrimazole Anti-Fungal 1 % cream Generic drug: clotrimazole Apply topically 2 (two) times daily.   Constulose 10 GM/15ML solution Generic drug: lactulose Take 45 mLs (30 g total) by mouth daily as needed for mild constipation.   escitalopram 10 MG tablet Commonly known as: LEXAPRO Take 1 tablet (10 mg total) by mouth daily.   Gerhardt's butt cream Crea Apply 1 Application topically 2 (two) times daily.   hydrOXYzine 25 MG tablet Commonly known as: ATARAX Take 1 tablet (25 mg total) by mouth 3 (three) times daily as needed for anxiety.   ipratropium-albuterol 0.5-2.5 (3) MG/3ML Soln Commonly known as: DUONEB Take 3 mLs by nebulization every 6 (six) hours as needed.   Combivent Respimat 20-100 MCG/ACT Aers respimat Generic drug: Ipratropium-Albuterol Inhale 1 puff into the lungs every 6 (six) hours.   mesalamine 1.2 g EC tablet Commonly known as: LIALDA Take 1 tablet (1.2 g total) by mouth 2 (two) times daily.   oxyCODONE 5 MG immediate release tablet Commonly known as: Oxy IR/ROXICODONE Take 1 tablet (5 mg total) by mouth 2 (two) times daily  as needed for moderate pain (pain score 4-6) or severe pain (pain score 7-10).   pantoprazole 40 MG tablet Commonly known as: PROTONIX Take 1 tablet (40 mg total) by mouth at bedtime.   polyvinyl alcohol 1.4 % ophthalmic solution Commonly known as: LIQUIFILM TEARS Place 1 drop into both eyes as needed for dry eyes.   predniSONE 5 MG tablet Commonly known as: DELTASONE Take 7 tablets (35 mg total) by mouth daily with breakfast for 1 day, THEN 6 tablets (30 mg total) daily with breakfast for 1 day, THEN 5 tablets (25 mg total) daily with breakfast for 1 day, THEN 4 tablets (20 mg total) daily with breakfast for 1 day, THEN 3 tablets (15 mg total) daily with breakfast for 1 day, THEN 2 tablets (10 mg total) daily with breakfast for 1 day, THEN 1 tablet (5 mg total) daily with breakfast for 1 day. Start taking on: May 20, 2023   spironolactone 25 MG tablet Commonly known as: ALDACTONE Take 1 tablet (25 mg total) by mouth daily.   Stimulant Laxative 8.6-50  MG tablet Generic drug: senna-docusate Take 2 tablets by mouth at bedtime.   torsemide 20 MG tablet Commonly known as: DEMADEX Take 2 tablets (40 mg total) by mouth 2 (two) times daily.   traZODone 50 MG tablet Commonly known as: DESYREL Take 1 tablet (50 mg total) by mouth at bedtime as needed for sleep.   trolamine salicylate 10 % cream Commonly known as: ASPERCREME Apply topically as needed for muscle pain.        Discharge Exam: Filed Weights   05/14/23 0136 05/15/23 2212  Weight: (!) 199.6 kg (!) 176.9 kg   GEN: NAD SKIN: Warm and dry EYES: No pallor or icterus ENT: MMM, +capped trach CV: RRR PULM: CTA B ABD: soft, obese, NT, +BS CNS: AAO x 3, non focal EXT: No edema or tenderness   Condition at discharge: good  The results of significant diagnostics from this hospitalization (including imaging, microbiology, ancillary and laboratory) are listed below for reference.   Imaging Studies: CT Angio Chest  PE W/Cm &/Or Wo Cm Result Date: 05/14/2023 CLINICAL DATA:  Pulmonary embolism suspected.  High probability EXAM: CT ANGIOGRAPHY CHEST WITH CONTRAST TECHNIQUE: Multidetector CT imaging of the chest was performed using the standard protocol during bolus administration of intravenous contrast. Multiplanar CT image reconstructions and MIPs were obtained to evaluate the vascular anatomy. RADIATION DOSE REDUCTION: This exam was performed according to the departmental dose-optimization program which includes automated exposure control, adjustment of the mA and/or kV according to patient size and/or use of iterative reconstruction technique. CONTRAST:  75mL OMNIPAQUE IOHEXOL 350 MG/ML SOLN COMPARISON:  None Available. FINDINGS: Cardiovascular: Images degraded by body motion and body habitus. With this caveat, no filling defects within pulmonary arteries to suggest acute pulmonary embolism. Segmental pulmonary arteries are poorly evaluated. Mediastinum/Nodes: No axillary or supraclavicular adenopathy. No mediastinal or hilar adenopathy. No pericardial fluid. Esophagus normal. Tracheostomy tube in good position. Lungs/Pleura: No pulmonary infarction. No pneumonia. No pleural fluid. No pneumothorax Bibasilar atelectasis. Upper Abdomen: Limited view of the liver, kidneys, pancreas are unremarkable. Normal adrenal glands. Musculoskeletal: Healed posterior LEFT rib fractures. No acute findings Review of the MIP images confirms the above findings. IMPRESSION: 1. No evidence acute pulmonary embolism. Suboptimal exam due to body habitus and patient motion. 2. Bibasilar atelectasis.  No pulmonary infarction or pneumonia. Electronically Signed   By: Genevive Bi M.D.   On: 05/14/2023 10:32   DG Chest Port 1 View Result Date: 05/14/2023 CLINICAL DATA:  Patient with SOB, cough, and weakness. Hx of COPD, tracheostomy, and former smoker. EXAM: PORTABLE CHEST 1 VIEW COMPARISON:  Chest x-ray 03/26/2023, CT abdomen pelvis 09/07/2022,  chest x-ray 02/17/2023 FINDINGS: Tracheostomy terminates 4 cm above the carina. Persistent cardiomegaly. The heart and mediastinal contours are unchanged. Prominent hilar vasculature. Low lung volumes. Left mid lung zone linear atelectasis versus airspace opacity. No focal consolidation. No pulmonary edema. No pleural effusion. No pneumothorax. No acute osseous abnormality. IMPRESSION: 1. Low lung volumes with venous pulmonary congestion. 2. Cardiomegaly with underlying pericardial effusion not excluded. Electronically Signed   By: Tish Frederickson M.D.   On: 05/14/2023 02:37    Microbiology: Results for orders placed or performed during the hospital encounter of 05/14/23  Culture, blood (routine x 2)     Status: None   Collection Time: 05/14/23  1:29 AM   Specimen: BLOOD  Result Value Ref Range Status   Specimen Description BLOOD  Final   Special Requests   Final    BOTTLES DRAWN AEROBIC AND ANAEROBIC Blood  Culture adequate volume   Culture   Final    NO GROWTH 5 DAYS Performed at [facility], [address] [place], [place] [address]    Report Status 05/19/2023 FINAL  Final  Culture, blood (routine x 2)     Status: None   Collection Time: 05/14/23  3:08 AM   Specimen: BLOOD  Result Value Ref Range Status   Specimen Description BLOOD RIGHT [facility]  Final   Special Requests   Final    BOTTLES DRAWN AEROBIC AND ANAEROBIC Blood Culture results may not be optimal due to an inadequate volume of blood received in culture bottles   Culture   Final    NO GROWTH 5 DAYS Performed at [facility], [address] [place], [place] [address]    Report Status 05/19/2023 FINAL  Final  Resp panel by RT-PCR (RSV, Flu A&B, Covid) Anterior Nasal Swab     Status: None   Collection Time: 05/14/23  3:08 AM   Specimen: Anterior Nasal Swab  Result Value Ref Range Status   SARS Coronavirus 2 by RT PCR NEGATIVE NEGATIVE Final    Comment: (NOTE) SARS-CoV-2 target nucleic acids are NOT  DETECTED.  The SARS-CoV-2 RNA is generally detectable in upper respiratory specimens during the acute phase of infection. The lowest concentration of SARS-CoV-2 viral copies this assay can detect is 138 copies/mL. A negative result does not preclude SARS-Cov-2 infection and should not be used as the sole basis for treatment or other patient management decisions. A negative result may occur with  improper specimen collection/handling, submission of specimen other than nasopharyngeal swab, presence of viral mutation(s) within the areas targeted by this assay, and inadequate number of viral copies(<138 copies/mL). A negative result must be combined with clinical observations, patient history, and epidemiological information. The expected result is Negative.  Fact Sheet for Patients:  BloggerCourse.com  Fact Sheet for Healthcare Providers:  SeriousBroker.it  This test is no t yet approved or cleared by the [place] FDA and  has been authorized for detection and/or diagnosis of SARS-CoV-2 by FDA under an Emergency Use Authorization (EUA). This EUA will remain  in effect (meaning this test can be used) for the duration of the COVID-19 declaration under Section 564(b)(1) of the Act, 21 U.S.C.section 360bbb-3(b)(1), unless the authorization is terminated  or revoked sooner.       Influenza A by PCR NEGATIVE NEGATIVE Final   Influenza B by PCR NEGATIVE NEGATIVE Final    Comment: (NOTE) The Xpert Xpress SARS-CoV-2/FLU/RSV plus assay is intended as an aid in the diagnosis of influenza from Nasopharyngeal swab specimens and should not be used as a sole basis for treatment. Nasal washings and aspirates are unacceptable for Xpert Xpress SARS-CoV-2/FLU/RSV testing.  Fact Sheet for Patients: BloggerCourse.com  Fact Sheet for Healthcare Providers: SeriousBroker.it  This test is not yet  approved or cleared by the [place] FDA and has been authorized for detection and/or diagnosis of SARS-CoV-2 by FDA under an Emergency Use Authorization (EUA). This EUA will remain in effect (meaning this test can be used) for the duration of the COVID-19 declaration under Section 564(b)(1) of the Act, 21 U.S.C. section 360bbb-3(b)(1), unless the authorization is terminated or revoked.     Resp Syncytial Virus by PCR NEGATIVE NEGATIVE Final    Comment: (NOTE) Fact Sheet for Patients: BloggerCourse.com  Fact Sheet for Healthcare Providers: SeriousBroker.it  This test is not yet approved or cleared by the [place] and has been authorized for  detection and/or diagnosis of SARS-CoV-2 by FDA under an Emergency Use Authorization (EUA). This EUA will remain in effect (meaning this test can be used) for the duration of the COVID-19 declaration under Section 564(b)(1) of the Act, 21 U.S.C. section 360bbb-3(b)(1), unless the authorization is terminated or revoked.  Performed at [facility], [address] [place], [place] [address]   Respiratory (~20 pathogens) panel by PCR     Status: None   Collection Time: 05/14/23  6:54 AM   Specimen: Nasopharyngeal Swab; Respiratory  Result Value Ref Range Status   Adenovirus NOT DETECTED NOT DETECTED Final   Coronavirus 229E NOT DETECTED NOT DETECTED Final    Comment: (NOTE) The Coronavirus on the Respiratory Panel, DOES NOT test for the novel  Coronavirus (2019 nCoV)    Coronavirus HKU1 NOT DETECTED NOT DETECTED Final   Coronavirus NL63 NOT DETECTED NOT DETECTED Final   Coronavirus OC43 NOT DETECTED NOT DETECTED Final   Metapneumovirus NOT DETECTED NOT DETECTED Final   Rhinovirus / Enterovirus NOT DETECTED NOT DETECTED Final   Influenza A NOT DETECTED NOT DETECTED Final   Influenza B NOT DETECTED NOT DETECTED Final   Parainfluenza Virus 1 NOT DETECTED NOT DETECTED  Final   Parainfluenza Virus 2 NOT DETECTED NOT DETECTED Final   Parainfluenza Virus 3 NOT DETECTED NOT DETECTED Final   Parainfluenza Virus 4 NOT DETECTED NOT DETECTED Final   Respiratory Syncytial Virus NOT DETECTED NOT DETECTED Final   Bordetella pertussis NOT DETECTED NOT DETECTED Final   Bordetella Parapertussis NOT DETECTED NOT DETECTED Final   Chlamydophila pneumoniae NOT DETECTED NOT DETECTED Final   Mycoplasma pneumoniae NOT DETECTED NOT DETECTED Final    Comment: Performed at [facility], [address], [place], [place] [address]  C Difficile Quick Screen w PCR reflex     Status: None   Collection Time: 05/15/23 12:19 AM   Specimen: STOOL  Result Value Ref Range Status   C Diff antigen NEGATIVE NEGATIVE Final   C Diff toxin NEGATIVE NEGATIVE Final   C Diff interpretation No C. difficile detected.  Final    Comment: Performed at [facility], [address] [place], [place] [address]  Gastrointestinal Panel by PCR , Stool     Status: Abnormal   Collection Time: 05/15/23 12:19 AM   Specimen: STOOL  Result Value Ref Range Status   Campylobacter species NOT DETECTED NOT DETECTED Final   Plesimonas shigelloides NOT DETECTED NOT DETECTED Final   Salmonella species NOT DETECTED NOT DETECTED Final   Yersinia enterocolitica NOT DETECTED NOT DETECTED Final   Vibrio species NOT DETECTED NOT DETECTED Final   Vibrio cholerae NOT DETECTED NOT DETECTED Final   Enteroaggregative E coli (EAEC) NOT DETECTED NOT DETECTED Final   Enteropathogenic E coli (EPEC) NOT DETECTED NOT DETECTED Final   Enterotoxigenic E coli (ETEC) NOT DETECTED NOT DETECTED Final   Shiga like toxin producing E coli (STEC) NOT DETECTED NOT DETECTED Final   Shigella/Enteroinvasive E coli (EIEC) NOT DETECTED NOT DETECTED Final   Cryptosporidium NOT DETECTED NOT DETECTED Final   Cyclospora cayetanensis NOT DETECTED NOT DETECTED Final   Entamoeba histolytica NOT DETECTED NOT DETECTED Final   Giardia  lamblia NOT DETECTED NOT DETECTED Final   Adenovirus F40/41 NOT DETECTED NOT DETECTED Final   Astrovirus NOT DETECTED NOT DETECTED Final   Norovirus GI/GII DETECTED (A) NOT DETECTED Final    Comment: RESULT CALLED TO, READ BACK BY AND VERIFIED WITH: [email or web address] 05/15/23 9147 MW  Rotavirus A NOT DETECTED NOT DETECTED Final   Sapovirus (I, II, IV, and V) NOT DETECTED NOT DETECTED Final    Comment: Performed at [facility], [address] [place], [place] [address]    Labs: CBC: Recent Labs  Lab 05/14/23 0129 05/16/23 1212 05/16/23 1751 05/17/23 0737 05/18/23 0839  WBC 7.5 5.5  --  5.4  --   NEUTROABS 6.4 4.5  --  3.5  --   HGB 9.8* 8.7* 8.8* 8.8* 9.1*  HCT 38.7* 32.6* 33.4* 33.1* 35.6*  MCV 78.3* 75.3*  --  74.4*  --   PLT 205 195  --  197  --    Basic Metabolic Panel: Recent Labs  Lab 05/14/23 0129 05/15/23 0517 05/16/23 0527 05/17/23 0737 05/18/23 0839  NA 137 138 139 144 140  K 3.5 3.1* 3.2* 3.5 3.5  CL 95* 97* 99 99 94*  CO2 33* 30 33* 35* 35*  GLUCOSE 105* 104* 85 135* 143*  BUN 22 32* 38* 32* 36*  CREATININE 1.36* 1.51* 1.68* 1.55* 1.63*  CALCIUM 8.0* 7.8* 7.7* 7.6* 7.7*  MG  --  1.8  --  2.0  --   PHOS  --   --   --  2.7  --    Liver Function Tests: Recent Labs  Lab 05/14/23 0129  AST 13*  ALT 11  ALKPHOS 92  BILITOT 0.9  PROT 8.8*  ALBUMIN 2.9*   CBG: No results for input(s): "GLUCAP" in the last 168 hours.  Discharge time spent: less than 30 minutes.  Signed: Lurene Shadow, MD [facility] 05/20/2023

## 2023-05-20 NOTE — Plan of Care (Signed)

## 2023-05-20 NOTE — Progress Notes (Signed)
Medications delivered to pt at bedside.

## 2023-05-22 DIAGNOSIS — K219 Gastro-esophageal reflux disease without esophagitis: Secondary | ICD-10-CM | POA: Diagnosis not present

## 2023-05-22 DIAGNOSIS — Z79891 Long term (current) use of opiate analgesic: Secondary | ICD-10-CM | POA: Diagnosis not present

## 2023-05-22 DIAGNOSIS — Z430 Encounter for attention to tracheostomy: Secondary | ICD-10-CM | POA: Diagnosis not present

## 2023-05-22 DIAGNOSIS — Z9981 Dependence on supplemental oxygen: Secondary | ICD-10-CM | POA: Diagnosis not present

## 2023-05-22 DIAGNOSIS — I130 Hypertensive heart and chronic kidney disease with heart failure and stage 1 through stage 4 chronic kidney disease, or unspecified chronic kidney disease: Secondary | ICD-10-CM | POA: Diagnosis not present

## 2023-05-22 DIAGNOSIS — Z8616 Personal history of COVID-19: Secondary | ICD-10-CM | POA: Diagnosis not present

## 2023-05-22 DIAGNOSIS — M51379 Other intervertebral disc degeneration, lumbosacral region without mention of lumbar back pain or lower extremity pain: Secondary | ICD-10-CM | POA: Diagnosis not present

## 2023-05-22 DIAGNOSIS — G894 Chronic pain syndrome: Secondary | ICD-10-CM | POA: Diagnosis not present

## 2023-05-22 DIAGNOSIS — F419 Anxiety disorder, unspecified: Secondary | ICD-10-CM | POA: Diagnosis not present

## 2023-05-22 DIAGNOSIS — D509 Iron deficiency anemia, unspecified: Secondary | ICD-10-CM | POA: Diagnosis not present

## 2023-05-22 DIAGNOSIS — K5730 Diverticulosis of large intestine without perforation or abscess without bleeding: Secondary | ICD-10-CM | POA: Diagnosis not present

## 2023-05-22 DIAGNOSIS — M159 Polyosteoarthritis, unspecified: Secondary | ICD-10-CM | POA: Diagnosis not present

## 2023-05-22 DIAGNOSIS — M5030 Other cervical disc degeneration, unspecified cervical region: Secondary | ICD-10-CM | POA: Diagnosis not present

## 2023-06-03 DIAGNOSIS — Z930 Tracheostomy status: Secondary | ICD-10-CM | POA: Diagnosis not present

## 2023-06-03 DIAGNOSIS — I509 Heart failure, unspecified: Secondary | ICD-10-CM | POA: Diagnosis not present

## 2023-06-03 DIAGNOSIS — J9611 Chronic respiratory failure with hypoxia: Secondary | ICD-10-CM | POA: Diagnosis not present

## 2023-06-03 DIAGNOSIS — Z9981 Dependence on supplemental oxygen: Secondary | ICD-10-CM | POA: Diagnosis not present

## 2023-06-05 DIAGNOSIS — J9611 Chronic respiratory failure with hypoxia: Secondary | ICD-10-CM | POA: Diagnosis not present

## 2023-06-05 DIAGNOSIS — I509 Heart failure, unspecified: Secondary | ICD-10-CM | POA: Diagnosis not present

## 2023-06-05 DIAGNOSIS — J449 Chronic obstructive pulmonary disease, unspecified: Secondary | ICD-10-CM | POA: Diagnosis not present

## 2023-06-07 ENCOUNTER — Other Ambulatory Visit: Payer: Self-pay

## 2023-06-07 ENCOUNTER — Emergency Department: Payer: No Typology Code available for payment source

## 2023-06-07 ENCOUNTER — Inpatient Hospital Stay
Admission: EM | Admit: 2023-06-07 | Discharge: 2023-06-15 | DRG: 291 | Disposition: A | Discharge status: Home / Self Care | Payer: No Typology Code available for payment source | Attending: Hospitalist | Admitting: Hospitalist

## 2023-06-07 DIAGNOSIS — Z79899 Other long term (current) drug therapy: Secondary | ICD-10-CM

## 2023-06-07 DIAGNOSIS — J441 Chronic obstructive pulmonary disease with (acute) exacerbation: Secondary | ICD-10-CM | POA: Diagnosis present

## 2023-06-07 DIAGNOSIS — Z823 Family history of stroke: Secondary | ICD-10-CM

## 2023-06-07 DIAGNOSIS — E785 Hyperlipidemia, unspecified: Secondary | ICD-10-CM | POA: Diagnosis present

## 2023-06-07 DIAGNOSIS — Z1152 Encounter for screening for COVID-19: Secondary | ICD-10-CM | POA: Diagnosis not present

## 2023-06-07 DIAGNOSIS — K529 Noninfective gastroenteritis and colitis, unspecified: Secondary | ICD-10-CM | POA: Diagnosis present

## 2023-06-07 DIAGNOSIS — I509 Heart failure, unspecified: Secondary | ICD-10-CM

## 2023-06-07 DIAGNOSIS — J9622 Acute and chronic respiratory failure with hypercapnia: Secondary | ICD-10-CM | POA: Diagnosis present

## 2023-06-07 DIAGNOSIS — Z9049 Acquired absence of other specified parts of digestive tract: Secondary | ICD-10-CM

## 2023-06-07 DIAGNOSIS — N179 Acute kidney failure, unspecified: Secondary | ICD-10-CM | POA: Diagnosis not present

## 2023-06-07 DIAGNOSIS — I5033 Acute on chronic diastolic (congestive) heart failure: Secondary | ICD-10-CM | POA: Diagnosis not present

## 2023-06-07 DIAGNOSIS — D631 Anemia in chronic kidney disease: Secondary | ICD-10-CM | POA: Diagnosis not present

## 2023-06-07 DIAGNOSIS — Z8616 Personal history of COVID-19: Secondary | ICD-10-CM

## 2023-06-07 DIAGNOSIS — J13 Pneumonia due to Streptococcus pneumoniae: Secondary | ICD-10-CM | POA: Diagnosis present

## 2023-06-07 DIAGNOSIS — J90 Pleural effusion, not elsewhere classified: Secondary | ICD-10-CM | POA: Diagnosis not present

## 2023-06-07 DIAGNOSIS — Z8711 Personal history of peptic ulcer disease: Secondary | ICD-10-CM

## 2023-06-07 DIAGNOSIS — J9621 Acute and chronic respiratory failure with hypoxia: Principal | ICD-10-CM

## 2023-06-07 DIAGNOSIS — J449 Chronic obstructive pulmonary disease, unspecified: Secondary | ICD-10-CM | POA: Diagnosis not present

## 2023-06-07 DIAGNOSIS — I77810 Thoracic aortic ectasia: Secondary | ICD-10-CM | POA: Diagnosis not present

## 2023-06-07 DIAGNOSIS — I130 Hypertensive heart and chronic kidney disease with heart failure and stage 1 through stage 4 chronic kidney disease, or unspecified chronic kidney disease: Principal | ICD-10-CM | POA: Diagnosis present

## 2023-06-07 DIAGNOSIS — I714 Abdominal aortic aneurysm, without rupture, unspecified: Secondary | ICD-10-CM | POA: Diagnosis not present

## 2023-06-07 DIAGNOSIS — T502X5A Adverse effect of carbonic-anhydrase inhibitors, benzothiadiazides and other diuretics, initial encounter: Secondary | ICD-10-CM | POA: Diagnosis not present

## 2023-06-07 DIAGNOSIS — G894 Chronic pain syndrome: Secondary | ICD-10-CM | POA: Diagnosis not present

## 2023-06-07 DIAGNOSIS — Z87891 Personal history of nicotine dependence: Secondary | ICD-10-CM

## 2023-06-07 DIAGNOSIS — Z430 Encounter for attention to tracheostomy: Secondary | ICD-10-CM | POA: Diagnosis not present

## 2023-06-07 DIAGNOSIS — N1831 Chronic kidney disease, stage 3a: Secondary | ICD-10-CM | POA: Diagnosis present

## 2023-06-07 DIAGNOSIS — I5030 Unspecified diastolic (congestive) heart failure: Secondary | ICD-10-CM | POA: Diagnosis present

## 2023-06-07 DIAGNOSIS — Z6841 Body Mass Index (BMI) 40.0 and over, adult: Secondary | ICD-10-CM | POA: Diagnosis not present

## 2023-06-07 DIAGNOSIS — J9611 Chronic respiratory failure with hypoxia: Secondary | ICD-10-CM | POA: Diagnosis not present

## 2023-06-07 DIAGNOSIS — E66813 Obesity, class 3: Secondary | ICD-10-CM | POA: Diagnosis present

## 2023-06-07 DIAGNOSIS — R069 Unspecified abnormalities of breathing: Secondary | ICD-10-CM | POA: Diagnosis not present

## 2023-06-07 DIAGNOSIS — R54 Age-related physical debility: Secondary | ICD-10-CM | POA: Diagnosis not present

## 2023-06-07 DIAGNOSIS — R918 Other nonspecific abnormal finding of lung field: Secondary | ICD-10-CM | POA: Diagnosis not present

## 2023-06-07 DIAGNOSIS — G4733 Obstructive sleep apnea (adult) (pediatric): Secondary | ICD-10-CM | POA: Diagnosis not present

## 2023-06-07 DIAGNOSIS — Z8701 Personal history of pneumonia (recurrent): Secondary | ICD-10-CM

## 2023-06-07 DIAGNOSIS — J440 Chronic obstructive pulmonary disease with acute lower respiratory infection: Secondary | ICD-10-CM | POA: Diagnosis not present

## 2023-06-07 DIAGNOSIS — J9811 Atelectasis: Secondary | ICD-10-CM | POA: Diagnosis not present

## 2023-06-07 DIAGNOSIS — J811 Chronic pulmonary edema: Secondary | ICD-10-CM | POA: Diagnosis not present

## 2023-06-07 DIAGNOSIS — J9620 Acute and chronic respiratory failure, unspecified whether with hypoxia or hypercapnia: Secondary | ICD-10-CM | POA: Diagnosis present

## 2023-06-07 DIAGNOSIS — R0602 Shortness of breath: Secondary | ICD-10-CM | POA: Diagnosis not present

## 2023-06-07 DIAGNOSIS — Z833 Family history of diabetes mellitus: Secondary | ICD-10-CM | POA: Diagnosis not present

## 2023-06-07 DIAGNOSIS — I959 Hypotension, unspecified: Secondary | ICD-10-CM | POA: Diagnosis not present

## 2023-06-07 DIAGNOSIS — Z4682 Encounter for fitting and adjustment of non-vascular catheter: Secondary | ICD-10-CM | POA: Diagnosis not present

## 2023-06-07 DIAGNOSIS — R0902 Hypoxemia: Secondary | ICD-10-CM | POA: Diagnosis not present

## 2023-06-07 DIAGNOSIS — R0989 Other specified symptoms and signs involving the circulatory and respiratory systems: Secondary | ICD-10-CM | POA: Diagnosis not present

## 2023-06-07 DIAGNOSIS — Z930 Tracheostomy status: Secondary | ICD-10-CM | POA: Diagnosis not present

## 2023-06-07 LAB — CBC
HCT: 33.4 % — ABNORMAL LOW (ref 39.0–52.0)
Hemoglobin: 8.2 g/dL — ABNORMAL LOW (ref 13.0–17.0)
MCH: 19.5 pg — ABNORMAL LOW (ref 26.0–34.0)
MCHC: 24.6 g/dL — ABNORMAL LOW (ref 30.0–36.0)
MCV: 79.5 fL — ABNORMAL LOW (ref 80.0–100.0)
Platelets: 141 10*3/uL — ABNORMAL LOW (ref 150–400)
RBC: 4.2 MIL/uL — ABNORMAL LOW (ref 4.22–5.81)
RDW: 20.3 % — ABNORMAL HIGH (ref 11.5–15.5)
WBC: 5.9 10*3/uL (ref 4.0–10.5)
nRBC: 1.4 % — ABNORMAL HIGH (ref 0.0–0.2)

## 2023-06-07 LAB — BASIC METABOLIC PANEL
Anion gap: 7 (ref 5–15)
BUN: 18 mg/dL (ref 8–23)
CO2: 38 mmol/L — ABNORMAL HIGH (ref 22–32)
Calcium: 8.3 mg/dL — ABNORMAL LOW (ref 8.9–10.3)
Chloride: 97 mmol/L — ABNORMAL LOW (ref 98–111)
Creatinine, Ser: 1.1 mg/dL (ref 0.61–1.24)
GFR, Estimated: >60 mL/min (ref >60)
Glucose, Bld: 90 mg/dL (ref 70–99)
Potassium: 3.9 mmol/L (ref 3.5–5.1)
Sodium: 142 mmol/L (ref 135–145)

## 2023-06-07 LAB — PROCALCITONIN: Procalcitonin: 0.14 ng/mL

## 2023-06-07 LAB — RESP PANEL BY RT-PCR (RSV, FLU A&B, COVID)  RVPGX2
Influenza A by PCR: NEGATIVE
Influenza B by PCR: NEGATIVE
Resp Syncytial Virus by PCR: NEGATIVE
SARS Coronavirus 2 by RT PCR: NEGATIVE

## 2023-06-07 LAB — BRAIN NATRIURETIC PEPTIDE: B Natriuretic Peptide: 113.8 pg/mL — ABNORMAL HIGH (ref 0.0–100.0)

## 2023-06-07 MED ORDER — ONDANSETRON HCL 4 MG/2ML IJ SOLN: 4.0000 mg | Freq: Four times a day (QID) | INTRAMUSCULAR | Status: DC | PRN

## 2023-06-07 MED ORDER — SODIUM CHLORIDE 0.9 % IV SOLN: 250.0000 mL | INTRAVENOUS | Status: AC | PRN

## 2023-06-07 MED ORDER — ENOXAPARIN SODIUM 100 MG/ML IJ SOSY
0.5000 mg/kg | PREFILLED_SYRINGE | INTRAMUSCULAR | Status: DC
  Administered 2023-06-08 – 2023-06-12 (×5): 87.5 mg via SUBCUTANEOUS
  Filled 2023-06-07 (×8): qty 1

## 2023-06-07 MED ORDER — SODIUM CHLORIDE 0.9% FLUSH
3.0000 mL | Freq: Two times a day (BID) | INTRAVENOUS | Status: DC
  Administered 2023-06-07 – 2023-06-12 (×10): 3 mL via INTRAVENOUS

## 2023-06-07 MED ORDER — PANTOPRAZOLE SODIUM 40 MG PO TBEC
40.0000 mg | DELAYED_RELEASE_TABLET | Freq: Every day | ORAL | Status: DC
  Filled 2023-06-07 (×3): qty 1

## 2023-06-07 MED ORDER — SODIUM CHLORIDE 0.9 % IV SOLN
500.0000 mg | Freq: Once | INTRAVENOUS | Status: AC
  Administered 2023-06-07: 500 mg via INTRAVENOUS
  Filled 2023-06-07: qty 5

## 2023-06-07 MED ORDER — ENOXAPARIN SODIUM 40 MG/0.4ML IJ SOSY: 40.0000 mg | PREFILLED_SYRINGE | INTRAMUSCULAR | Status: DC

## 2023-06-07 MED ORDER — AMPHETAMINE-DEXTROAMPHETAMINE 10 MG PO TABS
30.0000 mg | ORAL_TABLET | Freq: Every day | ORAL | Status: DC
  Administered 2023-06-08 – 2023-06-15 (×8): 30 mg via ORAL
  Filled 2023-06-07 (×9): qty 3

## 2023-06-07 MED ORDER — SODIUM CHLORIDE 0.9 % IV SOLN
2.0000 g | Freq: Once | INTRAVENOUS | Status: AC
  Administered 2023-06-07: 2 g via INTRAVENOUS
  Filled 2023-06-07: qty 20

## 2023-06-07 MED ORDER — IPRATROPIUM-ALBUTEROL 0.5-2.5 (3) MG/3ML IN SOLN
3.0000 mL | Freq: Four times a day (QID) | RESPIRATORY_TRACT | Status: DC
  Administered 2023-06-07 – 2023-06-08 (×3): 3 mL via RESPIRATORY_TRACT
  Filled 2023-06-07 (×3): qty 3

## 2023-06-07 MED ORDER — ACETAMINOPHEN 325 MG PO TABS: 650.0000 mg | ORAL_TABLET | ORAL | Status: DC | PRN

## 2023-06-07 MED ORDER — ESCITALOPRAM OXALATE 10 MG PO TABS
10.0000 mg | ORAL_TABLET | Freq: Every day | ORAL | Status: DC
  Administered 2023-06-08 – 2023-06-15 (×8): 10 mg via ORAL
  Filled 2023-06-07 (×8): qty 1

## 2023-06-07 MED ORDER — FUROSEMIDE 10 MG/ML IJ SOLN
80.0000 mg | Freq: Once | INTRAMUSCULAR | Status: AC
  Administered 2023-06-07: 80 mg via INTRAVENOUS
  Filled 2023-06-07: qty 8

## 2023-06-07 MED ORDER — ACETAZOLAMIDE SODIUM 500 MG IJ SOLR
500.0000 mg | Freq: Once | INTRAMUSCULAR | Status: AC
  Administered 2023-06-07: 500 mg via INTRAVENOUS
  Filled 2023-06-07: qty 500

## 2023-06-07 MED ORDER — HYDROXYZINE HCL 25 MG PO TABS
25.0000 mg | ORAL_TABLET | Freq: Three times a day (TID) | ORAL | Status: DC | PRN
  Administered 2023-06-08: 25 mg via ORAL
  Filled 2023-06-07 (×2): qty 1

## 2023-06-07 MED ORDER — FUROSEMIDE 10 MG/ML IJ SOLN
40.0000 mg | Freq: Two times a day (BID) | INTRAMUSCULAR | Status: DC
  Administered 2023-06-07 – 2023-06-09 (×4): 40 mg via INTRAVENOUS
  Filled 2023-06-07 (×4): qty 4

## 2023-06-07 MED ORDER — POLYVINYL ALCOHOL 1.4 % OP SOLN: 1.0000 [drp] | OPHTHALMIC | Status: DC | PRN

## 2023-06-07 MED ORDER — TRAZODONE HCL 50 MG PO TABS
50.0000 mg | ORAL_TABLET | Freq: Every evening | ORAL | Status: DC | PRN
  Administered 2023-06-08 – 2023-06-13 (×6): 50 mg via ORAL
  Filled 2023-06-07 (×6): qty 1

## 2023-06-07 MED ORDER — OXYCODONE HCL 5 MG PO TABS
5.0000 mg | ORAL_TABLET | Freq: Two times a day (BID) | ORAL | Status: DC | PRN
  Administered 2023-06-08 – 2023-06-13 (×9): 5 mg via ORAL
  Filled 2023-06-07 (×9): qty 1

## 2023-06-07 MED ORDER — METHYLPREDNISOLONE SODIUM SUCC 125 MG IJ SOLR
125.0000 mg | INTRAMUSCULAR | Status: AC
  Administered 2023-06-07: 125 mg via INTRAVENOUS
  Filled 2023-06-07: qty 2

## 2023-06-07 MED ORDER — ACETAMINOPHEN 325 MG PO TABS: 650.0000 mg | ORAL_TABLET | Freq: Four times a day (QID) | ORAL | Status: DC | PRN

## 2023-06-07 MED ORDER — IPRATROPIUM-ALBUTEROL 0.5-2.5 (3) MG/3ML IN SOLN: 3.0000 mL | Freq: Four times a day (QID) | RESPIRATORY_TRACT | Status: DC | PRN

## 2023-06-07 MED ORDER — IPRATROPIUM-ALBUTEROL 0.5-2.5 (3) MG/3ML IN SOLN: 3.0000 mL | Freq: Four times a day (QID) | RESPIRATORY_TRACT | Status: DC

## 2023-06-07 MED ORDER — SENNOSIDES-DOCUSATE SODIUM 8.6-50 MG PO TABS
2.0000 | ORAL_TABLET | Freq: Every day | ORAL | Status: DC
  Administered 2023-06-10 – 2023-06-14 (×2): 2 via ORAL
  Filled 2023-06-07 (×4): qty 2

## 2023-06-07 MED ORDER — MOMETASONE FURO-FORMOTEROL FUM 100-5 MCG/ACT IN AERO
2.0000 | INHALATION_SPRAY | Freq: Two times a day (BID) | RESPIRATORY_TRACT | Status: DC
  Administered 2023-06-08 – 2023-06-15 (×12): 2 via RESPIRATORY_TRACT
  Filled 2023-06-07: qty 8.8

## 2023-06-07 MED ORDER — BUDESONIDE 0.25 MG/2ML IN SUSP
0.2500 mg | Freq: Two times a day (BID) | RESPIRATORY_TRACT | Status: DC
  Administered 2023-06-07 – 2023-06-15 (×16): 0.25 mg via RESPIRATORY_TRACT
  Filled 2023-06-07 (×16): qty 2

## 2023-06-07 MED ORDER — MESALAMINE 1.2 G PO TBEC
1.2000 g | DELAYED_RELEASE_TABLET | Freq: Two times a day (BID) | ORAL | Status: DC
Start: 2023-06-07 — End: 2023-06-16
  Administered 2023-06-08 – 2023-06-15 (×15): 1.2 g via ORAL
  Filled 2023-06-07 (×19): qty 1

## 2023-06-07 MED ORDER — IPRATROPIUM-ALBUTEROL 20-100 MCG/ACT IN AERS: 1.0000 | INHALATION_SPRAY | Freq: Four times a day (QID) | RESPIRATORY_TRACT | Status: DC

## 2023-06-07 MED ORDER — ALBUTEROL SULFATE (2.5 MG/3ML) 0.083% IN NEBU
5.0000 mg | INHALATION_SOLUTION | Freq: Once | RESPIRATORY_TRACT | Status: AC
  Administered 2023-06-07: 5 mg via RESPIRATORY_TRACT
  Filled 2023-06-07: qty 6

## 2023-06-07 MED ORDER — BISACODYL 10 MG RE SUPP: 10.0000 mg | Freq: Every day | RECTAL | Status: DC | PRN

## 2023-06-07 MED ORDER — IPRATROPIUM-ALBUTEROL 0.5-2.5 (3) MG/3ML IN SOLN
3.0000 mL | Freq: Once | RESPIRATORY_TRACT | Status: AC
  Administered 2023-06-07: 3 mL via RESPIRATORY_TRACT
  Filled 2023-06-07: qty 3

## 2023-06-07 MED ORDER — SODIUM CHLORIDE 0.9% FLUSH
3.0000 mL | INTRAVENOUS | Status: DC | PRN
  Administered 2023-06-10: 3 mL via INTRAVENOUS

## 2023-06-07 MED ORDER — SPIRONOLACTONE 25 MG PO TABS
25.0000 mg | ORAL_TABLET | Freq: Every day | ORAL | Status: DC
  Administered 2023-06-08 – 2023-06-10 (×3): 25 mg via ORAL
  Filled 2023-06-07 (×3): qty 1

## 2023-06-07 NOTE — ED Triage Notes (Signed)
Pt comes via EMS from home with sob, cough and congestion for two weeks. Pt states he hasn't been suctioned. Pt has trach. Pt smell of urine  BP 109/52 02-97% on trach HR-84 Temp 98.2

## 2023-06-07 NOTE — ED Notes (Addendum)
Pt was desatting to 75% on 10L on trach collar, now increased to 100% FIO2 and to 12L on trach collar

## 2023-06-07 NOTE — ED Provider Notes (Signed)
[facility] Provider Note    Event Date/Time   First MD Initiated Contact with Patient 06/07/23 1034     (approximate)   History   Chief Complaint: Shortness of Breath   HPI  Gary [name] is a 68 y.o. male with a history of morbid obesity, COPD, tracheostomy, CHF, CKD who comes to the ED due to worsening shortness of breath over the past week.  Also reports congestion and coughing up thick mucus.  Also has headache body ache and fatigue.  Endorses chills.  No acute pain.          Physical Exam   Triage Vital Signs: ED Triage Vitals [06/07/23 1018]  Encounter Vitals Group     BP      Systolic BP Percentile      Diastolic BP Percentile      Pulse      Resp      Temp      Temp src      SpO2      Weight (!) 390 lb (176.9 kg)     Height 6\' 4"  (1.93 m)     Head Circumference      Peak Flow      Pain Score 0     Pain Loc      Pain Education      Exclude from Growth Chart     Most recent vital signs: Vitals:   06/07/23 1206 06/07/23 1330  BP:  129/83  Pulse:  82  Resp:  (!) 35  Temp:    SpO2: 92% 99%    General: Awake, no distress.  CV:  Good peripheral perfusion.  Regular rate rhythm Resp:  Normal effort.  Basilar crackles bilaterally.  Mild expiratory wheezing Abd:  No distention.  Soft nontender Other:  Chronic lymphedema   ED Results / Procedures / Treatments   Labs (all labs ordered are listed, but only abnormal results are displayed) Labs Reviewed  BASIC METABOLIC PANEL - Abnormal; Notable for the following components:      Result Value   Chloride 97 (*)    CO2 38 (*)    Calcium 8.3 (*)    All other components within normal limits  CBC - Abnormal; Notable for the following components:   RBC 4.20 (*)    Hemoglobin 8.2 (*)    HCT 33.4 (*)    MCV 79.5 (*)    MCH 19.5 (*)    MCHC 24.6 (*)    RDW 20.3 (*)    Platelets 141 (*)    nRBC 1.4 (*)    All other components within normal limits  BRAIN NATRIURETIC  PEPTIDE - Abnormal; Notable for the following components:   B Natriuretic Peptide 113.8 (*)    All other components within normal limits  RESP PANEL BY RT-PCR (RSV, FLU A&B, COVID)  [id number]  PROCALCITONIN     EKG    RADIOLOGY Chest x-ray interpreted by me, shows pulmonary edema in the lower lung fields.   PROCEDURES:  Procedures   MEDICATIONS ORDERED IN ED: Medications  azithromycin (ZITHROMAX) 500 mg in sodium chloride 0.9 % 250 mL IVPB (500 mg Intravenous New Bag/Given 06/07/23 1338)  oxyCODONE (Oxy IR/ROXICODONE) immediate release tablet 5 mg (has no administration in time range)  spironolactone (ALDACTONE) tablet 25 mg (has no administration in time range)  furosemide (LASIX) injection 40 mg (has no administration in time range)  amphetamine-dextroamphetamine (ADDERALL) tablet 30 mg (has no administration in time range)  escitalopram (LEXAPRO) tablet 10 mg (has no administration in time range)  hydrOXYzine (ATARAX) tablet 25 mg (has no administration in time range)  traZODone (DESYREL) tablet 50 mg (has no administration in time range)  bisacodyl (DULCOLAX) suppository 10 mg (has no administration in time range)  mesalamine (LIALDA) EC tablet 1.2 g (has no administration in time range)  pantoprazole (PROTONIX) EC tablet 40 mg (has no administration in time range)  senna-docusate (Senokot-S) tablet 2 tablet (has no administration in time range)  ipratropium-albuterol (DUONEB) 0.5-2.5 (3) MG/3ML nebulizer solution 3 mL (has no administration in time range)  polyvinyl alcohol (LIQUIFILM TEARS) 1.4 % ophthalmic solution 1 drop (has no administration in time range)  sodium chloride flush (NS) 0.9 % injection 3 mL (has no administration in time range)  sodium chloride flush (NS) 0.9 % injection 3 mL (has no administration in time range)  0.9 %  sodium chloride infusion (has no administration in time range)  acetaminophen (TYLENOL) tablet 650 mg (has no administration in time  range)  ondansetron (ZOFRAN) injection 4 mg (has no administration in time range)  acetaZOLAMIDE (DIAMOX) injection 500 mg (has no administration in time range)  enoxaparin (LOVENOX) injection 87.5 mg (has no administration in time range)  ipratropium-albuterol (DUONEB) 0.5-2.5 (3) MG/3ML nebulizer solution 3 mL (has no administration in time range)  methylPREDNISolone sodium succinate (SOLU-MEDROL) 125 mg/2 mL injection 125 mg (125 mg Intravenous Given 06/07/23 1318)  ipratropium-albuterol (DUONEB) 0.5-2.5 (3) MG/3ML nebulizer solution 3 mL (3 mLs Nebulization Given 06/07/23 1206)  albuterol (PROVENTIL) (2.5 MG/3ML) 0.083% nebulizer solution 5 mg (5 mg Nebulization Given 06/07/23 1206)  furosemide (LASIX) injection 80 mg (80 mg Intravenous Given 06/07/23 1324)  cefTRIAXone (ROCEPHIN) 2 g in sodium chloride 0.9 % 100 mL IVPB (2 g Intravenous New Bag/Given 06/07/23 1338)     IMPRESSION / MDM / ASSESSMENT AND PLAN / ED COURSE  I reviewed the triage vital signs and the nursing notes.  DDx: Pneumonia,, RSV, COPD exacerbation, heart failure exacerbation, pulmonary edema, AKI, electrolyte derangement, NSTEMI, doubt PE  Patient's presentation is most consistent with acute presentation with potential threat to life or bodily function.  Patient presents with cough congestion wheezing and increased oxygen requirement, now needing 6 L nasal cannula to maintain his oxygen saturation of 90% compared to baseline 4 L nasal cannula.  Suspect flulike illness, COPD exacerbation.  Will give Solu-Medrol, bronchodilators while obtaining chest x-ray labs.   ----------------------------------------- 2:36 PM on 06/07/2023 ----------------------------------------- Chest x-ray consistent with pulmonary edema.  Patient given IV Lasix.  With his severe comorbidities and acute on chronic respiratory failure, will need hospitalization for further management.  Not septic, but will give a dose of empiric antibiotic coverage.   Case discussed with hospitalist for further management.      FINAL CLINICAL IMPRESSION(S) / ED DIAGNOSES   Final diagnoses:  Acute on chronic respiratory failure with hypoxia ([name])  Morbid obesity ([name])  Tracheostomy dependent ([name])     Rx / DC Orders   ED Discharge Orders     None        Note:  This document was prepared using Dragon voice recognition software and may include unintentional dictation errors.   Sharman Cheek, MD 06/07/23 [phone]

## 2023-06-07 NOTE — ED Notes (Addendum)
Pts sats dropped to 88%, FiO2 increased to 60%, sats increased to 89-90%

## 2023-06-07 NOTE — ED Notes (Signed)
Informed RN bed assigned 

## 2023-06-07 NOTE — ED Notes (Signed)
Pt was placed on Bipap by RT, sats @89 -91%

## 2023-06-07 NOTE — Progress Notes (Signed)
PHARMACIST - PHYSICIAN COMMUNICATION  CONCERNING:  Enoxaparin (Lovenox) for DVT Prophylaxis    RECOMMENDATION: Patient was prescribed enoxaprin 40mg  q24 hours for VTE prophylaxis.   Filed Weights   06/07/23 1018  Weight: (!) 176.9 kg (390 lb)    Body mass index is 47.47 kg/m.  Estimated Creatinine Clearance: 116.3 mL/min (by C-G formula based on SCr of 1.1 mg/dL).   Based on [facility] policy patient is candidate for enoxaparin 0.5mg /kg TBW SQ every 24 hours based on BMI being >30.   DESCRIPTION: Pharmacy has adjusted enoxaparin dose per [facility] policy.  Patient is now receiving enoxaparin 87.5 mg every 24 hours    Merryl Hacker, PharmD Clinical Pharmacist  06/07/2023 2:23 PM

## 2023-06-07 NOTE — ED Notes (Signed)
Pt taken to xray and then to ED 14 follow scans
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
## 2023-06-07 NOTE — ED Notes (Signed)
 RT bedside.

## 2023-06-08 ENCOUNTER — Inpatient Hospital Stay: Payer: No Typology Code available for payment source

## 2023-06-08 DIAGNOSIS — I5033 Acute on chronic diastolic (congestive) heart failure: Secondary | ICD-10-CM | POA: Diagnosis not present

## 2023-06-08 LAB — BASIC METABOLIC PANEL
Anion gap: 6 (ref 5–15)
BUN: 25 mg/dL — ABNORMAL HIGH (ref 8–23)
CO2: 37 mmol/L — ABNORMAL HIGH (ref 22–32)
Calcium: 8.4 mg/dL — ABNORMAL LOW (ref 8.9–10.3)
Chloride: 99 mmol/L (ref 98–111)
Creatinine, Ser: 1.25 mg/dL — ABNORMAL HIGH (ref 0.61–1.24)
GFR, Estimated: >60 mL/min (ref >60)
Glucose, Bld: 108 mg/dL — ABNORMAL HIGH (ref 70–99)
Potassium: 4.2 mmol/L (ref 3.5–5.1)
Sodium: 142 mmol/L (ref 135–145)

## 2023-06-08 MED ORDER — DM-GUAIFENESIN ER 30-600 MG PO TB12
1.0000 | ORAL_TABLET | Freq: Two times a day (BID) | ORAL | Status: DC
  Administered 2023-06-08 – 2023-06-15 (×15): 1 via ORAL
  Filled 2023-06-08 (×16): qty 1

## 2023-06-08 MED ORDER — IPRATROPIUM-ALBUTEROL 0.5-2.5 (3) MG/3ML IN SOLN
3.0000 mL | Freq: Two times a day (BID) | RESPIRATORY_TRACT | Status: DC
  Administered 2023-06-08 – 2023-06-15 (×14): 3 mL via RESPIRATORY_TRACT
  Filled 2023-06-08 (×13): qty 3

## 2023-06-08 NOTE — Plan of Care (Signed)
   Problem: Education: Goal: Knowledge of General Education information will improve Description: Including pain rating scale, medication(s)/side effects and non-pharmacologic comfort measures Outcome: Progressing   Problem: Clinical Measurements: Goal: Ability to maintain clinical measurements within normal limits will improve Outcome: Progressing Goal: Will remain free from infection Outcome: Progressing Goal: Diagnostic test results will improve Outcome: Progressing Goal: Respiratory complications will improve Outcome: Progressing Goal: Cardiovascular complication will be avoided Outcome: Progressing   Problem: Health Behavior/Discharge Planning: Goal: Ability to manage health-related needs will improve Outcome: Progressing

## 2023-06-08 NOTE — Hospital Course (Signed)
 Hospital course / significant events:   HPI: Gary [name] is a 68 y.o. 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 Multiple hospitalizations for same. Patient reported that he has been congested for last 2 weeks with occasional cough of whitish phlegm increasing shortness of breath. Also has had subjective fever no chills.   02/21: in ED, tachypneic, SpO2 89% on 6L, 99% when in creased to 10L. CXR mod-severe pulm vasc congestion. Vanc + cefepime, Lasix 80 mg IV x1. Admitted to hospitalist service. Continued on diuresis, held further abx low procalcitonin.  02/22: continue diuresis and O2 support  02/23: PT/OT to see. Cr up, will dc IV diuresis given AKI and plan transition back to his po home meds tomorrow. If doing well on po meds anticipate can discharge Mon/Tues. He requests to see his pulmonologist tomorrow as well if possible  02/24: alert this morning, fairly sudden decline early afternoon, somnolent, hypercarbic, CXR appears stable cardiomegaly and pulm vasc congestion. BiPap placed, PCCU eval, will avoid ICU transfer if possible give him some time on BiPap and repeat ABG or VBG in another hour, if continuing to decompensate / not improving may need to consider ICU transfer for ventilator. Renal functio better today will give dose IV lasix      Consultants:  none  Procedures/Surgeries: none      ASSESSMENT & PLAN:   Acute on chronic hypoxic and hypercapnic respiratory failure Acute on chronic HFpEF decompensation Diuresis  Echocardiogram was done several times last year will not repeat echo at this time. Other DDx, patient was hospitalized last week when he was diagnosed with  pneumonia and treated with full course of antibiotics.  Currently given his symptoms and x-ray findings showing bilateral pulmonary congestion as well as low procalcitonin, low suspicion for recurrent pneumonia. Defer abx to PCCM   Acute COPD exacerbation Received 3 rounds of DuoNebs, less wheezing Pulmicort, ICS and LABA As needed DuoNebs   Chronic normocytic anemia H&H stable, no signs of active bleeding Colonoscopy last year showed chronic colitis. Reports diarrhea Monitor CBC   AKI on CKD stage IIIa likely d/t diuresis  Careful diuresis    Class 3 morbid obesity based on BMI: Body mass index is 47.47 kg/m.  Underweight - under 18  overweight - 25 to 29 obese - 30 or more Class 1 obesity: BMI of 30.0 to 34 Class 2 obesity: BMI of 35.0 to 39 Class 3 obesity: BMI of 40.0 to 49 Super Morbid Obesity: BMI 50-59 Super-super Morbid Obesity: BMI 60+ Significantly low or high BMI is associated with higher medical risk.  Weight management advised as adjunct to other disease management and risk reduction treatments    DVT prophylaxis: lovenox  IV fluids: no continuous IV fluids  Nutrition: carb/cardiac diet  Central lines / invasive devices: none  Code Status: FULL CODE ACP documentation reviewed:  none on file in [facility]  TOC needs: TBD Barriers to dispo / significant pending items: diuresing, AKI

## 2023-06-08 NOTE — Plan of Care (Signed)

## 2023-06-08 NOTE — Progress Notes (Signed)
 Mobility Specialist - Progress Note   06/08/23 1530  Mobility  Activity Refused mobility   Pt defers mobility at this time, will attempt again at another date/time.   [name]  Mobility Specialist  06/08/23 3:31 PM

## 2023-06-08 NOTE — Progress Notes (Signed)
 PROGRESS NOTE    NEPHI [name]   VWU:[id number] DOB: 1955-06-14  DOA: 06/07/2023 Date of Service: 06/08/23 which is hospital day 1  PCP: Shayne Alken, MD    Hospital course / significant events:   HPI: Gary [name] is a 68 y.o. 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 Multiple hospitalizations for same. Patient reported that he has been congested for last 2 weeks with occasional cough of whitish phlegm increasing shortness of breath. Also has had subjective fever no chills.   02/21: in ED, tachypneic, SpO2 89% on 6L, 99% when in creased to 10L. CXR mod-severe pulm vasc congestion. Vanc + cefepime, Lasix 80 mg IV x1. Admitted to hospitalist service. Continued on diuresis, held further abx low procalcitonin.  02/22: continue diuresis and O2 support      Consultants:  none  Procedures/Surgeries: none      ASSESSMENT & PLAN:   Acute on chronic hypoxic and hypercapnic respiratory failure Acute on chronic HFpEF decompensation Continue IV Lasix 40 mg twice daily S/p 1 dose of Diamox Echocardiogram was done several times last year will not repeat echo at this time. Other DDx, patient was hospitalized last week when he was diagnosed with pneumonia and treated with full course of antibiotics.  Currently given his symptoms and x-ray findings showing bilateral pulmonary congestions as well as low procalcitonin, low suspicion for recurrent pneumonia.  Monitor off antibiotics.   Acute COPD exacerbation Received 3 rounds of DuoNebs, less wheezing Pulmicort, ICS and LABA As needed DuoNebs   Chronic normocytic anemia H&H stable, no signs of active  bleeding Colonoscopy last year showed chronic colitis. Reports diarrhea Monitor CBC   CKD stage IIIa Clinically appears to be volume overloaded, on Lasix Creatinine level stable, monitor kidney function daily     Class 3 morbid obesity based on BMI: Body mass index is 47.47 kg/m.  Underweight - under 18  overweight - 25 to 29 obese - 30 or more Class 1 obesity: BMI of 30.0 to 34 Class 2 obesity: BMI of 35.0 to 39 Class 3 obesity: BMI of 40.0 to 49 Super Morbid Obesity: BMI 50-59 Super-super Morbid Obesity: BMI 60+ Significantly low or high BMI is associated with higher medical risk.  Weight management advised as adjunct to other disease management and risk reduction treatments    DVT prophylaxis: lovenox  IV fluids: no continuous IV fluids  Nutrition: carb/cardiac diet  Central lines / invasive devices: none  Code Status: FULL CODE ACP documentation reviewed:  none on file in [facility]  TOC needs: TBD Barriers to dispo / significant pending items: diuresing              Subjective / Brief ROS:  Patient reports still SOB today Denies CP Pain controlled.  Denies new weakness.  Tolerating diet.  Reports no concerns w/ urination/defecation.   Family Communication: none     Objective Findings:  Vitals:   06/07/23 2347 06/08/23 0330 06/08/23 0504 06/08/23 0827  BP: 112/79  128/77   Pulse: 95  84   Resp: 20  20   Temp: 98.5 F (36.9 C)  98.4 F (36.9 C)   TempSrc: Oral  Oral   SpO2: 92% 93% 99% 95%  Weight:  Height:        Intake/Output Summary (Last 24 hours) at 06/08/2023 1412 Last data filed at 06/07/2023 1600 Gross per 24 hour  Intake --  Output 950 ml  Net -950 ml   Filed Weights   06/07/23 1018  Weight: (!) 176.9 kg    Examination:  Physical Exam Constitutional:      General: He is not in acute distress.    Appearance: He is obese.  Cardiovascular:     Rate and Rhythm: Normal rate and regular rhythm.  Pulmonary:     Effort:  Pulmonary effort is normal. No tachypnea, accessory muscle usage or respiratory distress.     Breath sounds: Decreased breath sounds and rales present.  Musculoskeletal:     Right lower leg: No edema.     Left lower leg: No edema.  Skin:    General: Skin is warm and dry.  Neurological:     General: No focal deficit present.     Mental Status: He is alert and oriented to person, place, and time.  Psychiatric:        Mood and Affect: Mood normal.        Behavior: Behavior normal.          Scheduled Medications:   amphetamine-dextroamphetamine  30 mg Oral Q breakfast   budesonide (PULMICORT) nebulizer solution  0.25 mg Nebulization BID   dextromethorphan-guaiFENesin  1 tablet Oral BID   enoxaparin (LOVENOX) injection  0.5 mg/kg Subcutaneous Q24H   escitalopram  10 mg Oral Daily   furosemide  40 mg Intravenous Q12H   ipratropium-albuterol  3 mL Nebulization BID   mesalamine  1.2 g Oral BID   mometasone-formoterol  2 puff Inhalation BID   pantoprazole  40 mg Oral QHS   senna-docusate  2 tablet Oral QHS   sodium chloride flush  3 mL Intravenous Q12H   spironolactone  25 mg Oral Daily    Continuous Infusions:  sodium chloride      PRN Medications:  sodium chloride, acetaminophen, bisacodyl, hydrOXYzine, ipratropium-albuterol, ondansetron (ZOFRAN) IV, oxyCODONE, polyvinyl alcohol, sodium chloride flush, traZODone  Antimicrobials from admission:  Anti-infectives (From admission, onward)    Start     Dose/Rate Route Frequency Ordered Stop   06/07/23 1330  cefTRIAXone (ROCEPHIN) 2 g in sodium chloride 0.9 % 100 mL IVPB        2 g 200 mL/hr over 30 Minutes Intravenous Once 06/07/23 1322 06/07/23 1439   06/07/23 1330  azithromycin (ZITHROMAX) 500 mg in sodium chloride 0.9 % 250 mL IVPB        500 mg 250 mL/hr over 60 Minutes Intravenous  Once 06/07/23 1322 06/07/23 1459           Data Reviewed:  I have personally reviewed the following...  CBC: Recent Labs  Lab  06/07/23 1224  WBC 5.9  HGB 8.2*  HCT 33.4*  MCV 79.5*  PLT 141*   Basic Metabolic Panel: Recent Labs  Lab 06/07/23 1224 06/08/23 0625  NA 142 142  K 3.9 4.2  CL 97* 99  CO2 38* 37*  GLUCOSE 90 108*  BUN 18 25*  CREATININE 1.10 1.25*  CALCIUM 8.3* 8.4*   GFR: Estimated Creatinine Clearance: 102.3 mL/min (A) (by C-G formula based on SCr of 1.25 mg/dL (H)). Liver Function Tests: No results for input(s): "AST", "ALT", "ALKPHOS", "BILITOT", "PROT", "ALBUMIN" in the last 168 hours. No results for input(s): "LIPASE", "AMYLASE" in the last 168 hours. No results for input(s): "AMMONIA" in the  last 168 hours. Coagulation Profile: No results for input(s): "INR", "PROTIME" in the last 168 hours. Cardiac Enzymes: No results for input(s): "CKTOTAL", "CKMB", "CKMBINDEX", "TROPONINI" in the last 168 hours. BNP (last 3 results) No results for input(s): "PROBNP" in the last 8760 hours. HbA1C: No results for input(s): "HGBA1C" in the last 72 hours. CBG: No results for input(s): "GLUCAP" in the last 168 hours. Lipid Profile: No results for input(s): "CHOL", "HDL", "LDLCALC", "TRIG", "CHOLHDL", "LDLDIRECT" in the last 72 hours. Thyroid Function Tests: No results for input(s): "TSH", "T4TOTAL", "FREET4", "T3FREE", "THYROIDAB" in the last 72 hours. Anemia Panel: No results for input(s): "VITAMINB12", "FOLATE", "FERRITIN", "TIBC", "IRON", "RETICCTPCT" in the last 72 hours. Most Recent Urinalysis On File:     Component Value Date/Time   COLORURINE YELLOW 11/07/2022 2151   APPEARANCEUR CLEAR 11/07/2022 2151   APPEARANCEUR Clear 04/15/2014 1240   LABSPEC 1.013 11/07/2022 2151   LABSPEC 1.013 04/15/2014 1240   PHURINE 5.0 11/07/2022 2151   GLUCOSEU NEGATIVE 11/07/2022 2151   GLUCOSEU Negative 04/15/2014 1240   HGBUR NEGATIVE 11/07/2022 2151   BILIRUBINUR NEGATIVE 11/07/2022 2151   BILIRUBINUR Negative 04/15/2014 1240   KETONESUR NEGATIVE 11/07/2022 2151   PROTEINUR NEGATIVE 11/07/2022  2151   NITRITE NEGATIVE 11/07/2022 2151   LEUKOCYTESUR NEGATIVE 11/07/2022 2151   LEUKOCYTESUR Negative 04/15/2014 1240   Sepsis Labs: @LABRCNTIP (procalcitonin:4,lacticidven:4) Microbiology: Recent Results (from the past 240 hours)  Resp panel by RT-PCR (RSV, Flu A&B, Covid) Anterior Nasal Swab     Status: None   Collection Time: 06/07/23  1:21 PM   Specimen: Anterior Nasal Swab  Result Value Ref Range Status   SARS Coronavirus 2 by RT PCR NEGATIVE NEGATIVE Final    Comment: (NOTE) SARS-CoV-2 target nucleic acids are NOT DETECTED.  The SARS-CoV-2 RNA is generally detectable in upper respiratory specimens during the acute phase of infection. The lowest concentration of SARS-CoV-2 viral copies this assay can detect is 138 copies/mL. A negative result does not preclude SARS-Cov-2 infection and should not be used as the sole basis for treatment or other patient management decisions. A negative result may occur with  improper specimen collection/handling, submission of specimen other than nasopharyngeal swab, presence of viral mutation(s) within the areas targeted by this assay, and inadequate number of viral copies(<138 copies/mL). A negative result must be combined with clinical observations, patient history, and epidemiological information. The expected result is Negative.  Fact Sheet for Patients:  BloggerCourse.com  Fact Sheet for Healthcare Providers:  SeriousBroker.it  This test is no t yet approved or cleared by the [place] FDA and  has been authorized for detection and/or diagnosis of SARS-CoV-2 by FDA under an Emergency Use Authorization (EUA). This EUA will remain  in effect (meaning this test can be used) for the duration of the COVID-19 declaration under Section 564(b)(1) of the Act, 21 U.S.C.section 360bbb-3(b)(1), unless the authorization is terminated  or revoked sooner.       Influenza A by PCR NEGATIVE  NEGATIVE Final   Influenza B by PCR NEGATIVE NEGATIVE Final    Comment: (NOTE) The Xpert Xpress SARS-CoV-2/FLU/RSV plus assay is intended as an aid in the diagnosis of influenza from Nasopharyngeal swab specimens and should not be used as a sole basis for treatment. Nasal washings and aspirates are unacceptable for Xpert Xpress SARS-CoV-2/FLU/RSV testing.  Fact Sheet for Patients: BloggerCourse.com  Fact Sheet for Healthcare Providers: SeriousBroker.it  This test is not yet approved or cleared by the [place] FDA and has been authorized for detection  and/or diagnosis of SARS-CoV-2 by FDA under an Emergency Use Authorization (EUA). This EUA will remain in effect (meaning this test can be used) for the duration of the COVID-19 declaration under Section 564(b)(1) of the Act, 21 U.S.C. section 360bbb-3(b)(1), unless the authorization is terminated or revoked.     Resp Syncytial Virus by PCR NEGATIVE NEGATIVE Final    Comment: (NOTE) Fact Sheet for Patients: BloggerCourse.com  Fact Sheet for Healthcare Providers: SeriousBroker.it  This test is not yet approved or cleared by the [place] FDA and has been authorized for detection and/or diagnosis of SARS-CoV-2 by FDA under an Emergency Use Authorization (EUA). This EUA will remain in effect (meaning this test can be used) for the duration of the COVID-19 declaration under Section 564(b)(1) of the Act, 21 U.S.C. section 360bbb-3(b)(1), unless the authorization is terminated or revoked.  Performed at [facility], [address], [place], [place] [address]       Radiology Studies last 3 days: DG Chest 1 View Result Date: 06/08/2023 CLINICAL DATA:  Congestive heart failure, shortness of breath. EXAM: CHEST  1 VIEW COMPARISON:  June 07, 2023. FINDINGS: Stable cardiomegaly. Probable bilateral pulmonary edema is  noted with associated bibasilar atelectasis and small pleural effusions. Tracheostomy tube is unchanged. Bony thorax is unremarkable. IMPRESSION: Stable cardiomegaly with probable bilateral pulmonary edema and associated bibasilar atelectasis and small pleural effusions. Electronically Signed   By: Lupita Raider M.D.   On: 06/08/2023 08:00   DG Chest 2 View Result Date: 06/07/2023 CLINICAL DATA:  Shortness of breath. EXAM: CHEST - 2 VIEW COMPARISON:  05/14/2023. FINDINGS: Low lung volume. There is diffuse moderate pulmonary vascular congestion/edema. Bilateral lung fields are clear. Bilateral costophrenic angles are clear. Stable moderately enlarged cardio-mediastinal silhouette. No acute osseous abnormalities. The soft tissues are within normal limits. Endotracheal tube is seen with its tip approximately 3.7 cm above the carina. IMPRESSION: 1. Moderate pulmonary vascular congestion/edema. 2. Endotracheal tube is seen with its tip approximately 3.7 cm above the carina. Electronically Signed   By: Jules Schick M.D.   On: 06/07/2023 11:46         Sunnie Nielsen, DO [facility] 06/08/2023, 2:12 PM    Dictation software may have been used to generate the above note. Typos may occur and escape review in typed/dictated notes. Please contact Dr Lyn Hollingshead directly for clarity if needed.  Staff may message me via secure chat in Epic  but this may not receive an immediate response,  please page me for urgent matters!  If 7PM-7AM, please contact night coverage [email or web address]

## 2023-06-09 DIAGNOSIS — I5033 Acute on chronic diastolic (congestive) heart failure: Secondary | ICD-10-CM | POA: Diagnosis not present

## 2023-06-09 LAB — CBC
HCT: 33.7 % — ABNORMAL LOW (ref 39.0–52.0)
Hemoglobin: 8.5 g/dL — ABNORMAL LOW (ref 13.0–17.0)
MCH: 19.3 pg — ABNORMAL LOW (ref 26.0–34.0)
MCHC: 25.2 g/dL — ABNORMAL LOW (ref 30.0–36.0)
MCV: 76.6 fL — ABNORMAL LOW (ref 80.0–100.0)
Platelets: 139 10*3/uL — ABNORMAL LOW (ref 150–400)
RBC: 4.4 MIL/uL (ref 4.22–5.81)
RDW: 20.8 % — ABNORMAL HIGH (ref 11.5–15.5)
WBC: 4.6 10*3/uL (ref 4.0–10.5)
nRBC: 1.1 % — ABNORMAL HIGH (ref 0.0–0.2)

## 2023-06-09 LAB — BASIC METABOLIC PANEL
Anion gap: 7 (ref 5–15)
BUN: 34 mg/dL — ABNORMAL HIGH (ref 8–23)
CO2: 37 mmol/L — ABNORMAL HIGH (ref 22–32)
Calcium: 8.4 mg/dL — ABNORMAL LOW (ref 8.9–10.3)
Chloride: 96 mmol/L — ABNORMAL LOW (ref 98–111)
Creatinine, Ser: 1.52 mg/dL — ABNORMAL HIGH (ref 0.61–1.24)
GFR, Estimated: 51 mL/min — ABNORMAL LOW (ref >60)
Glucose, Bld: 101 mg/dL — ABNORMAL HIGH (ref 70–99)
Potassium: 4.2 mmol/L (ref 3.5–5.1)
Sodium: 140 mmol/L (ref 135–145)

## 2023-06-09 MED ORDER — TORSEMIDE 20 MG PO TABS
40.0000 mg | ORAL_TABLET | Freq: Two times a day (BID) | ORAL | Status: DC
  Administered 2023-06-10: 40 mg via ORAL
  Filled 2023-06-09: qty 2

## 2023-06-09 MED ORDER — MENTHOL 3 MG MT LOZG
1.0000 | LOZENGE | OROMUCOSAL | Status: DC | PRN
Start: 2023-06-09 — End: 2023-06-16

## 2023-06-09 NOTE — Plan of Care (Signed)

## 2023-06-09 NOTE — Progress Notes (Signed)
 PROGRESS NOTE    [name]   ZOX:[id number] DOB: Jan 01, 1956  DOA: 06/07/2023 Date of Service: 06/09/23 which is hospital day 2  PCP: Shayne Alken, MD    Hospital course / significant events:   HPI: Gary [name] is a 68 y.o. 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 Multiple hospitalizations for same. Patient reported that he has been congested for last 2 weeks with occasional cough of whitish phlegm increasing shortness of breath. Also has had subjective fever no chills.   02/21: in ED, tachypneic, SpO2 89% on 6L, 99% when in creased to 10L. CXR mod-severe pulm vasc congestion. Vanc + cefepime, Lasix 80 mg IV x1. Admitted to hospitalist service. Continued on diuresis, held further abx low procalcitonin.  02/22: continue diuresis and O2 support  02/23: PT/OT to see. Cr up, will dc IV diuresis and plan transition back to his po home meds tomorrow. If doing well on po meds anticipate can discharge Mon/Tues. He requests to see his pulmonologist tomorrow as well if possible      Consultants:  none  Procedures/Surgeries: none      ASSESSMENT & PLAN:   Acute on chronic hypoxic and hypercapnic respiratory failure Acute on chronic HFpEF decompensation IV Lasix 40 mg twice daily --> dc today d/t bump up creatinine, will resume hom epo meds tomorrow  Echocardiogram was done several times last year will not repeat echo at this time. Other DDx, patient was hospitalized last week when he was diagnosed with pneumonia and treated with full course of antibiotics.  Currently given his symptoms and x-ray findings showing bilateral pulmonary congestion as well as low  procalcitonin, low suspicion for recurrent pneumonia.  Monitor off antibiotics.   Acute COPD exacerbation Received 3 rounds of DuoNebs, less wheezing Pulmicort, ICS and LABA As needed DuoNebs   Chronic normocytic anemia H&H stable, no signs of active bleeding Colonoscopy last year showed chronic colitis. Reports diarrhea Monitor CBC   AKI on CKD stage IIIa likely d/t diuresis  Holding lasix after his first dose today, resume home torsemide tomorrow     Class 3 morbid obesity based on BMI: Body mass index is 47.47 kg/m.  Underweight - under 18  overweight - 25 to 29 obese - 30 or more Class 1 obesity: BMI of 30.0 to 34 Class 2 obesity: BMI of 35.0 to 39 Class 3 obesity: BMI of 40.0 to 49 Super Morbid Obesity: BMI 50-59 Super-super Morbid Obesity: BMI 60+ Significantly low or high BMI is associated with higher medical risk.  Weight management advised as adjunct to other disease management and risk reduction treatments    DVT prophylaxis: lovenox  IV fluids: no continuous IV fluids  Nutrition: carb/cardiac diet  Central lines / invasive devices: none  Code Status: FULL CODE ACP documentation reviewed:  none on file in [facility]  TOC needs: TBD Barriers to dispo / significant pending items: diuresing, AKI             Subjective / Brief ROS:  Patient reports still SOB today worse w/ sitting up States he is coughing, coughed out his trach a few days ago requests to see pulmonary while he is here  Denies CP Pain controlled.  Denies new weakness.  Family Communication: none     Objective Findings:  Vitals:   06/09/23 0500 06/09/23 0740 06/09/23 0819 06/09/23 1141  BP:   112/67 116/75  Pulse:   86 88  Resp:   18 18  Temp:   98.1 F (36.7 C) 98.3 F (36.8 C)  TempSrc:    Oral  SpO2:  91% 92% 92%  Weight: (!) 185.3 kg     Height:        Intake/Output Summary (Last 24 hours) at 06/09/2023 1419 Last data filed at 06/09/2023 1142 Gross per 24 hour   Intake 960 ml  Output 2500 ml  Net -1540 ml   Filed Weights   06/07/23 1018 06/09/23 0500  Weight: (!) 176.9 kg (!) 185.3 kg    Examination:  Physical Exam Constitutional:      General: He is not in acute distress.    Appearance: He is obese.  Cardiovascular:     Rate and Rhythm: Normal rate and regular rhythm.  Pulmonary:     Effort: Pulmonary effort is normal. No tachypnea, accessory muscle usage or respiratory distress.     Breath sounds: Decreased breath sounds and rales (fainter compared to yesterday) present.  Musculoskeletal:     Right lower leg: No edema.     Left lower leg: No edema.  Skin:    General: Skin is warm and dry.  Neurological:     General: No focal deficit present.     Mental Status: He is alert and oriented to person, place, and time.  Psychiatric:        Mood and Affect: Mood normal.        Behavior: Behavior normal.          Scheduled Medications:   amphetamine-dextroamphetamine  30 mg Oral Q breakfast   budesonide (PULMICORT) nebulizer solution  0.25 mg Nebulization BID   dextromethorphan-guaiFENesin  1 tablet Oral BID   enoxaparin (LOVENOX) injection  0.5 mg/kg Subcutaneous Q24H   escitalopram  10 mg Oral Daily   ipratropium-albuterol  3 mL Nebulization BID   mesalamine  1.2 g Oral BID   mometasone-formoterol  2 puff Inhalation BID   pantoprazole  40 mg Oral QHS   senna-docusate  2 tablet Oral QHS   sodium chloride flush  3 mL Intravenous Q12H   spironolactone  25 mg Oral Daily   [START ON 06/10/2023] torsemide  40 mg Oral BID    Continuous Infusions:    PRN Medications:  acetaminophen, bisacodyl, hydrOXYzine, ipratropium-albuterol, ondansetron (ZOFRAN) IV, oxyCODONE, polyvinyl alcohol, sodium chloride flush, traZODone  Antimicrobials from admission:  Anti-infectives (From admission, onward)    Start     Dose/Rate Route Frequency Ordered Stop   06/07/23 1330  cefTRIAXone (ROCEPHIN) 2 g in sodium chloride 0.9 % 100 mL IVPB         2 g 200 mL/hr over 30 Minutes Intravenous Once 06/07/23 1322 06/07/23 1439   06/07/23 1330  azithromycin (ZITHROMAX) 500 mg in sodium chloride 0.9 % 250 mL IVPB        500 mg 250 mL/hr over 60 Minutes Intravenous  Once 06/07/23 1322 06/07/23 1459           Data Reviewed:  I have personally reviewed the following...  CBC: Recent Labs  Lab 06/07/23 1224 06/09/23 0614  WBC 5.9 4.6  HGB 8.2* 8.5*  HCT 33.4* 33.7*  MCV 79.5* 76.6*  PLT 141* 139*   Basic Metabolic Panel: Recent Labs  Lab 06/07/23 1224 06/08/23 0625 06/09/23 1478  NA 142 142 140  K 3.9 4.2 4.2  CL 97* 99 96*  CO2 38* 37* 37*  GLUCOSE 90 108* 101*  BUN 18 25* 34*  CREATININE 1.10 1.25* 1.52*  CALCIUM 8.3* 8.4* 8.4*   GFR: Estimated Creatinine Clearance: 86.5 mL/min (A) (by C-G formula based on SCr of 1.52 mg/dL (H)). Liver Function Tests: No results for input(s): "AST", "ALT", "ALKPHOS", "BILITOT", "PROT", "ALBUMIN" in the last 168 hours. No results for input(s): "LIPASE", "AMYLASE" in the last 168 hours. No results for input(s): "AMMONIA" in the last 168 hours. Coagulation Profile: No results for input(s): "INR", "PROTIME" in the last 168 hours. Cardiac Enzymes: No results for input(s): "CKTOTAL", "CKMB", "CKMBINDEX", "TROPONINI" in the last 168 hours. BNP (last 3 results) No results for input(s): "PROBNP" in the last 8760 hours. HbA1C: No results for input(s): "HGBA1C" in the last 72 hours. CBG: No results for input(s): "GLUCAP" in the last 168 hours. Lipid Profile: No results for input(s): "CHOL", "HDL", "LDLCALC", "TRIG", "CHOLHDL", "LDLDIRECT" in the last 72 hours. Thyroid Function Tests: No results for input(s): "TSH", "T4TOTAL", "FREET4", "T3FREE", "THYROIDAB" in the last 72 hours. Anemia Panel: No results for input(s): "VITAMINB12", "FOLATE", "FERRITIN", "TIBC", "IRON", "RETICCTPCT" in the last 72 hours. Most Recent Urinalysis On File:     Component Value Date/Time   COLORURINE  YELLOW 11/07/2022 2151   APPEARANCEUR CLEAR 11/07/2022 2151   APPEARANCEUR Clear 04/15/2014 1240   LABSPEC 1.013 11/07/2022 2151   LABSPEC 1.013 04/15/2014 1240   PHURINE 5.0 11/07/2022 2151   GLUCOSEU NEGATIVE 11/07/2022 2151   GLUCOSEU Negative 04/15/2014 1240   HGBUR NEGATIVE 11/07/2022 2151   BILIRUBINUR NEGATIVE 11/07/2022 2151   BILIRUBINUR Negative 04/15/2014 1240   KETONESUR NEGATIVE 11/07/2022 2151   PROTEINUR NEGATIVE 11/07/2022 2151   NITRITE NEGATIVE 11/07/2022 2151   LEUKOCYTESUR NEGATIVE 11/07/2022 2151   LEUKOCYTESUR Negative 04/15/2014 1240   Sepsis Labs: @LABRCNTIP (procalcitonin:4,lacticidven:4) Microbiology: Recent Results (from the past 240 hours)  Resp panel by RT-PCR (RSV, Flu A&B, Covid) Anterior Nasal Swab     Status: None   Collection Time: 06/07/23  1:21 PM   Specimen: Anterior Nasal Swab  Result Value Ref Range Status   SARS Coronavirus 2 by RT PCR NEGATIVE NEGATIVE Final    Comment: (NOTE) SARS-CoV-2 target nucleic acids are NOT DETECTED.  The SARS-CoV-2 RNA is generally detectable in upper respiratory specimens during the acute phase of infection. The lowest concentration of SARS-CoV-2 viral copies this assay can detect is 138 copies/mL. A negative result does not preclude SARS-Cov-2 infection and should not be used as the sole basis for treatment or other patient management decisions. A negative result may occur with  improper specimen collection/handling, submission of specimen other than nasopharyngeal swab, presence of viral mutation(s) within the areas targeted by this assay, and inadequate number of viral copies(<138 copies/mL). A negative result must be combined with clinical observations, patient history, and epidemiological information. The expected result is Negative.  Fact Sheet for Patients:  BloggerCourse.com  Fact Sheet for Healthcare Providers:  SeriousBroker.it  This test is no  t yet approved or cleared by the [place] FDA and  has been authorized for detection and/or diagnosis of SARS-CoV-2 by FDA under an Emergency Use Authorization (EUA). This EUA will remain  in effect (meaning this test can be used) for the duration of the COVID-19 declaration under Section 564(b)(1) of the Act, 21 U.S.C.section 360bbb-3(b)(1), unless the authorization is terminated  or revoked sooner.       Influenza A  by PCR NEGATIVE NEGATIVE Final   Influenza B by PCR NEGATIVE NEGATIVE Final    Comment: (NOTE) The Xpert Xpress SARS-CoV-2/FLU/RSV plus assay is intended as an aid in the diagnosis of influenza from Nasopharyngeal swab specimens and should not be used as a sole basis for treatment. Nasal washings and aspirates are unacceptable for Xpert Xpress SARS-CoV-2/FLU/RSV testing.  Fact Sheet for Patients: BloggerCourse.com  Fact Sheet for Healthcare Providers: SeriousBroker.it  This test is not yet approved or cleared by the [place] FDA and has been authorized for detection and/or diagnosis of SARS-CoV-2 by FDA under an Emergency Use Authorization (EUA). This EUA will remain in effect (meaning this test can be used) for the duration of the COVID-19 declaration under Section 564(b)(1) of the Act, 21 U.S.C. section 360bbb-3(b)(1), unless the authorization is terminated or revoked.     Resp Syncytial Virus by PCR NEGATIVE NEGATIVE Final    Comment: (NOTE) Fact Sheet for Patients: BloggerCourse.com  Fact Sheet for Healthcare Providers: SeriousBroker.it  This test is not yet approved or cleared by the [place] FDA and has been authorized for detection and/or diagnosis of SARS-CoV-2 by FDA under an Emergency Use Authorization (EUA). This EUA will remain in effect (meaning this test can be used) for the duration of the COVID-19 declaration under Section  564(b)(1) of the Act, 21 U.S.C. section 360bbb-3(b)(1), unless the authorization is terminated or revoked.  Performed at [facility], [address] [place], [place] [address]       Radiology Studies last 3 days: DG Chest 1 View Result Date: 06/08/2023 CLINICAL DATA:  Congestive heart failure, shortness of breath. EXAM: CHEST  1 VIEW COMPARISON:  June 07, 2023. FINDINGS: Stable cardiomegaly. Probable bilateral pulmonary edema is noted with associated bibasilar atelectasis and small pleural effusions. Tracheostomy tube is unchanged. Bony thorax is unremarkable. IMPRESSION: Stable cardiomegaly with probable bilateral pulmonary edema and associated bibasilar atelectasis and small pleural effusions. Electronically Signed   By: Lupita Raider M.D.   On: 06/08/2023 08:00   DG Chest 2 View Result Date: 06/07/2023 CLINICAL DATA:  Shortness of breath. EXAM: CHEST - 2 VIEW COMPARISON:  05/14/2023. FINDINGS: Low lung volume. There is diffuse moderate pulmonary vascular congestion/edema. Bilateral lung fields are clear. Bilateral costophrenic angles are clear. Stable moderately enlarged cardio-mediastinal silhouette. No acute osseous abnormalities. The soft tissues are within normal limits. Endotracheal tube is seen with its tip approximately 3.7 cm above the carina. IMPRESSION: 1. Moderate pulmonary vascular congestion/edema. 2. Endotracheal tube is seen with its tip approximately 3.7 cm above the carina. Electronically Signed   By: Jules Schick M.D.   On: 06/07/2023 11:46         Sunnie Nielsen, DO [facility] 06/09/2023, 2:19 PM    Dictation software may have been used to generate the above note. Typos may occur and escape review in typed/dictated notes. Please contact Dr Lyn Hollingshead directly for clarity if needed.  Staff may message me via secure chat in Epic  but this may not receive an immediate response,  please page me for urgent matters!  If 7PM-7AM, please  contact night coverage [email or web address]

## 2023-06-10 ENCOUNTER — Inpatient Hospital Stay: Payer: No Typology Code available for payment source

## 2023-06-10 DIAGNOSIS — I5033 Acute on chronic diastolic (congestive) heart failure: Secondary | ICD-10-CM | POA: Diagnosis not present

## 2023-06-10 LAB — GLUCOSE, CAPILLARY
Glucose-Capillary: 73 mg/dL (ref 70–99)
Glucose-Capillary: 87 mg/dL (ref 70–99)

## 2023-06-10 LAB — BLOOD GAS, VENOUS
Acid-Base Excess: 10.4 mmol/L — ABNORMAL HIGH (ref 0.0–2.0)
Acid-Base Excess: 10.1 mmol/L — ABNORMAL HIGH (ref 0.0–2.0)
Acid-Base Excess: 10.7 mmol/L — ABNORMAL HIGH (ref 0.0–2.0)
Bicarbonate: 41.7 mmol/L — ABNORMAL HIGH (ref 20.0–28.0)
Bicarbonate: 41.6 mmol/L — ABNORMAL HIGH (ref 20.0–28.0)
Bicarbonate: 42.1 mmol/L — ABNORMAL HIGH (ref 20.0–28.0)
Delivery systems: POSITIVE
Delivery systems: POSITIVE
FIO2: 50 %
O2 Saturation: 91 %
O2 Saturation: 85.3 %
O2 Saturation: 60.7 %
Patient temperature: 37
Patient temperature: 37
Patient temperature: 37
RATE: 20 {breaths}/min
pCO2, Ven: 95 mm[Hg] (ref 44–60)
pCO2, Ven: 97 mm[Hg] (ref 44–60)
pCO2, Ven: 96 mm[Hg] (ref 44–60)
pH, Ven: 7.25 (ref 7.25–7.43)
pH, Ven: 7.24 — ABNORMAL LOW (ref 7.25–7.43)
pH, Ven: 7.25 (ref 7.25–7.43)
pO2, Ven: 62 mm[Hg] — ABNORMAL HIGH (ref 32–45)
pO2, Ven: 53 mm[Hg] — ABNORMAL HIGH (ref 32–45)
pO2, Ven: 36 mm[Hg] (ref 32–45)

## 2023-06-10 LAB — BASIC METABOLIC PANEL
Anion gap: 5 (ref 5–15)
BUN: 30 mg/dL — ABNORMAL HIGH (ref 8–23)
CO2: 35 mmol/L — ABNORMAL HIGH (ref 22–32)
Calcium: 8.4 mg/dL — ABNORMAL LOW (ref 8.9–10.3)
Chloride: 98 mmol/L (ref 98–111)
Creatinine, Ser: 1.26 mg/dL — ABNORMAL HIGH (ref 0.61–1.24)
GFR, Estimated: >60 mL/min (ref >60)
Glucose, Bld: 94 mg/dL (ref 70–99)
Potassium: 3.7 mmol/L (ref 3.5–5.1)
Sodium: 138 mmol/L (ref 135–145)

## 2023-06-10 LAB — BLOOD GAS, ARTERIAL
Acid-Base Excess: 11.3 mmol/L — ABNORMAL HIGH (ref 0.0–2.0)
Bicarbonate: 42.6 mmol/L — ABNORMAL HIGH (ref 20.0–28.0)
O2 Content: 40 L/min
O2 Saturation: 91.4 %
Patient temperature: 37
pCO2 arterial: 95 mm[Hg] (ref 32–48)
pH, Arterial: 7.26 — ABNORMAL LOW (ref 7.35–7.45)
pO2, Arterial: 66 mm[Hg] — ABNORMAL LOW (ref 83–108)

## 2023-06-10 MED ORDER — FUROSEMIDE 10 MG/ML IJ SOLN: 40.0000 mg | Freq: Once | INTRAMUSCULAR | Status: DC

## 2023-06-10 MED ORDER — HYDROXYZINE HCL 50 MG/ML IM SOLN: 25.0000 mg | Freq: Three times a day (TID) | INTRAMUSCULAR | Status: DC | PRN

## 2023-06-10 MED ORDER — PANTOPRAZOLE SODIUM 40 MG IV SOLR
40.0000 mg | Freq: Every day | INTRAVENOUS | Status: DC
  Administered 2023-06-10 – 2023-06-12 (×3): 40 mg via INTRAVENOUS
  Filled 2023-06-10 (×3): qty 10

## 2023-06-10 NOTE — Progress Notes (Signed)
 PROGRESS NOTE    Gary [name]   ZOX:[id number] DOB: 01-Mar-1956  DOA: 06/07/2023 Date of Service: 06/10/23 which is hospital day 3  PCP: Shayne Alken, MD    Hospital course / significant events:   HPI: Gary [name] is a 68 y.o. 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 Multiple hospitalizations for same. Patient reported that he has been congested for last 2 weeks with occasional cough of whitish phlegm increasing shortness of breath. Also has had subjective fever no chills.   02/21: in ED, tachypneic, SpO2 89% on 6L, 99% when in creased to 10L. CXR mod-severe pulm vasc congestion. Vanc + cefepime, Lasix 80 mg IV x1. Admitted to hospitalist service. Continued on diuresis, held further abx low procalcitonin.  02/22: continue diuresis and O2 support  02/23: PT/OT to see. Cr up, will dc IV diuresis given AKI and plan transition back to his po home meds tomorrow. If doing well on po meds anticipate can discharge Mon/Tues. He requests to see his pulmonologist tomorrow as well if possible  02/24: alert this morning, fairly sudden decline early afternoon, somnolent, hypercarbic, CXR appears stable cardiomegaly and pulm vasc congestion. BiPap placed, PCCU eval, will avoid ICU transfer if possible give him some time on BiPap and repeat ABG or VBG in another hour, if continuing to decompensate / not improving may need to consider ICU transfer for ventilator. Renal functio better today will give dose IV lasix      Consultants:  none  Procedures/Surgeries: none      ASSESSMENT & PLAN:   Acute on chronic hypoxic and hypercapnic respiratory failure Acute on chronic HFpEF  decompensation Diuresis  Echocardiogram was done several times last year will not repeat echo at this time. Other DDx, patient was hospitalized last week when he was diagnosed with pneumonia and treated with full course of antibiotics.  Currently given his symptoms and x-ray findings showing bilateral pulmonary congestion as well as low procalcitonin, low suspicion for recurrent pneumonia. Defer abx to PCCM   Acute COPD exacerbation Received 3 rounds of DuoNebs, less wheezing Pulmicort, ICS and LABA As needed DuoNebs   Chronic normocytic anemia H&H stable, no signs of active bleeding Colonoscopy last year showed chronic colitis. Reports diarrhea Monitor CBC   AKI on CKD stage IIIa likely d/t diuresis  Careful diuresis    Class 3 morbid obesity based on BMI: Body mass index is 47.47 kg/m.  Underweight - under 18  overweight - 25 to 29 obese - 30 or more Class 1 obesity: BMI of 30.0 to 34 Class 2 obesity: BMI of 35.0 to 39 Class 3 obesity: BMI of 40.0 to 49 Super Morbid Obesity: BMI 50-59 Super-super Morbid Obesity: BMI 60+ Significantly low or high BMI is associated with higher medical risk.  Weight management advised as adjunct to other disease management and risk reduction treatments    DVT prophylaxis: lovenox  IV fluids: no continuous IV fluids  Nutrition: carb/cardiac diet  Central lines / invasive devices: none  Code Status: FULL CODE ACP documentation reviewed:  none on file in [facility]  TOC needs: TBD Barriers to dispo / significant pending items: diuresing, AKI             Subjective /  Brief ROS:  Patient reports this morning no CP SOB about same as usual Says he does not want to discharge until Weds, he has no place to go, Pain controlled.  Denies new weakness.   Family Communication: none     Objective Findings:  Vitals:   06/10/23 0500 06/10/23 0846 06/10/23 1227 06/10/23 1239  BP: 122/71 138/85 125/76   Pulse: 100 85 95   Resp:    20   Temp: 98.2 F (36.8 C)  (!) 97.4 F (36.3 C)   TempSrc: Oral  Oral   SpO2:  97% (!) 82% 91%  Weight: (!) 185.6 kg     Height:        Intake/Output Summary (Last 24 hours) at 06/10/2023 1406 Last data filed at 06/09/2023 1550 Gross per 24 hour  Intake 240 ml  Output 700 ml  Net -460 ml   Filed Weights   06/07/23 1018 06/09/23 0500 06/10/23 0500  Weight: (!) 176.9 kg (!) 185.3 kg (!) 185.6 kg    Examination:  Physical Exam Constitutional:      General: He is not in acute distress.    Appearance: He is obese.     Comments: Alert and conversational on morning rounds, revisited in afternoon and will open eyes to voice, follow commands, isn't speaking   Cardiovascular:     Rate and Rhythm: Normal rate and regular rhythm.  Pulmonary:     Effort: Pulmonary effort is normal. No tachypnea, accessory muscle usage or respiratory distress.     Breath sounds: Decreased breath sounds and rales (fainter compared to yesterday) present.  Musculoskeletal:     Right lower leg: No edema.     Left lower leg: No edema.  Skin:    General: Skin is warm and dry.  Neurological:     General: No focal deficit present.     Mental Status: He is alert and oriented to person, place, and time.  Psychiatric:        Mood and Affect: Mood normal.        Behavior: Behavior normal.          Scheduled Medications:   amphetamine-dextroamphetamine  30 mg Oral Q breakfast   budesonide (PULMICORT) nebulizer solution  0.25 mg Nebulization BID   dextromethorphan-guaiFENesin  1 tablet Oral BID   enoxaparin (LOVENOX) injection  0.5 mg/kg Subcutaneous Q24H   escitalopram  10 mg Oral Daily   ipratropium-albuterol  3 mL Nebulization BID   mesalamine  1.2 g Oral BID   mometasone-formoterol  2 puff Inhalation BID   pantoprazole  40 mg Oral QHS   senna-docusate  2 tablet Oral QHS   sodium chloride flush  3 mL Intravenous Q12H   spironolactone  25 mg Oral Daily   torsemide  40 mg Oral BID    Continuous  Infusions:    PRN Medications:  acetaminophen, bisacodyl, hydrOXYzine, ipratropium-albuterol, menthol-cetylpyridinium, ondansetron (ZOFRAN) IV, oxyCODONE, polyvinyl alcohol, sodium chloride flush, traZODone  Antimicrobials from admission:  Anti-infectives (From admission, onward)    Start     Dose/Rate Route Frequency Ordered Stop   06/07/23 1330  cefTRIAXone (ROCEPHIN) 2 g in sodium chloride 0.9 % 100 mL IVPB        2 g 200 mL/hr over 30 Minutes Intravenous Once 06/07/23 1322 06/07/23 1439   06/07/23 1330  azithromycin (ZITHROMAX) 500 mg in sodium chloride 0.9 % 250 mL IVPB        500 mg 250 mL/hr over 60 Minutes Intravenous  Once 06/07/23  1322 06/07/23 1459           Data Reviewed:  I have personally reviewed the following...  CBC: Recent Labs  Lab 06/07/23 1224 06/09/23 0614  WBC 5.9 4.6  HGB 8.2* 8.5*  HCT 33.4* 33.7*  MCV 79.5* 76.6*  PLT 141* 139*   Basic Metabolic Panel: Recent Labs  Lab 06/07/23 1224 06/08/23 0625 06/09/23 0614 06/10/23 0606  NA 142 142 140 138  K 3.9 4.2 4.2 3.7  CL 97* 99 96* 98  CO2 38* 37* 37* 35*  GLUCOSE 90 108* 101* 94  BUN 18 25* 34* 30*  CREATININE 1.10 1.25* 1.52* 1.26*  CALCIUM 8.3* 8.4* 8.4* 8.4*   GFR: Estimated Creatinine Clearance: 104.4 mL/min (A) (by C-G formula based on SCr of 1.26 mg/dL (H)). Liver Function Tests: No results for input(s): "AST", "ALT", "ALKPHOS", "BILITOT", "PROT", "ALBUMIN" in the last 168 hours. No results for input(s): "LIPASE", "AMYLASE" in the last 168 hours. No results for input(s): "AMMONIA" in the last 168 hours. Coagulation Profile: No results for input(s): "INR", "PROTIME" in the last 168 hours. Cardiac Enzymes: No results for input(s): "CKTOTAL", "CKMB", "CKMBINDEX", "TROPONINI" in the last 168 hours. BNP (last 3 results) No results for input(s): "PROBNP" in the last 8760 hours. HbA1C: No results for input(s): "HGBA1C" in the last 72 hours. CBG: No results for input(s):  "GLUCAP" in the last 168 hours. Lipid Profile: No results for input(s): "CHOL", "HDL", "LDLCALC", "TRIG", "CHOLHDL", "LDLDIRECT" in the last 72 hours. Thyroid Function Tests: No results for input(s): "TSH", "T4TOTAL", "FREET4", "T3FREE", "THYROIDAB" in the last 72 hours. Anemia Panel: No results for input(s): "VITAMINB12", "FOLATE", "FERRITIN", "TIBC", "IRON", "RETICCTPCT" in the last 72 hours. Most Recent Urinalysis On File:     Component Value Date/Time   COLORURINE YELLOW 11/07/2022 2151   APPEARANCEUR CLEAR 11/07/2022 2151   APPEARANCEUR Clear 04/15/2014 1240   LABSPEC 1.013 11/07/2022 2151   LABSPEC 1.013 04/15/2014 1240   PHURINE 5.0 11/07/2022 2151   GLUCOSEU NEGATIVE 11/07/2022 2151   GLUCOSEU Negative 04/15/2014 1240   HGBUR NEGATIVE 11/07/2022 2151   BILIRUBINUR NEGATIVE 11/07/2022 2151   BILIRUBINUR Negative 04/15/2014 1240   KETONESUR NEGATIVE 11/07/2022 2151   PROTEINUR NEGATIVE 11/07/2022 2151   NITRITE NEGATIVE 11/07/2022 2151   LEUKOCYTESUR NEGATIVE 11/07/2022 2151   LEUKOCYTESUR Negative 04/15/2014 1240   Sepsis Labs: @LABRCNTIP (procalcitonin:4,lacticidven:4) Microbiology: Recent Results (from the past 240 hours)  Resp panel by RT-PCR (RSV, Flu A&B, Covid) Anterior Nasal Swab     Status: None   Collection Time: 06/07/23  1:21 PM   Specimen: Anterior Nasal Swab  Result Value Ref Range Status   SARS Coronavirus 2 by RT PCR NEGATIVE NEGATIVE Final    Comment: (NOTE) SARS-CoV-2 target nucleic acids are NOT DETECTED.  The SARS-CoV-2 RNA is generally detectable in upper respiratory specimens during the acute phase of infection. The lowest concentration of SARS-CoV-2 viral copies this assay can detect is 138 copies/mL. A negative result does not preclude SARS-Cov-2 infection and should not be used as the sole basis for treatment or other patient management decisions. A negative result may occur with  improper specimen collection/handling, submission of specimen  other than nasopharyngeal swab, presence of viral mutation(s) within the areas targeted by this assay, and inadequate number of viral copies(<138 copies/mL). A negative result must be combined with clinical observations, patient history, and epidemiological information. The expected result is Negative.  Fact Sheet for Patients:  BloggerCourse.com  Fact Sheet for Healthcare Providers:  SeriousBroker.it  This  test is no t yet approved or cleared by the [place] and  has been authorized for detection and/or diagnosis of SARS-CoV-2 by FDA under an Emergency Use Authorization (EUA). This EUA will remain  in effect (meaning this test can be used) for the duration of the COVID-19 declaration under Section 564(b)(1) of the Act, 21 U.S.C.section 360bbb-3(b)(1), unless the authorization is terminated  or revoked sooner.       Influenza A by PCR NEGATIVE NEGATIVE Final   Influenza B by PCR NEGATIVE NEGATIVE Final    Comment: (NOTE) The Xpert Xpress SARS-CoV-2/FLU/RSV plus assay is intended as an aid in the diagnosis of influenza from Nasopharyngeal swab specimens and should not be used as a sole basis for treatment. Nasal washings and aspirates are unacceptable for Xpert Xpress SARS-CoV-2/FLU/RSV testing.  Fact Sheet for Patients: BloggerCourse.com  Fact Sheet for Healthcare Providers: SeriousBroker.it  This test is not yet approved or cleared by the [place] FDA and has been authorized for detection and/or diagnosis of SARS-CoV-2 by FDA under an Emergency Use Authorization (EUA). This EUA will remain in effect (meaning this test can be used) for the duration of the COVID-19 declaration under Section 564(b)(1) of the Act, 21 U.S.C. section 360bbb-3(b)(1), unless the authorization is terminated or revoked.     Resp Syncytial Virus by PCR NEGATIVE NEGATIVE Final     Comment: (NOTE) Fact Sheet for Patients: BloggerCourse.com  Fact Sheet for Healthcare Providers: SeriousBroker.it  This test is not yet approved or cleared by the [place] FDA and has been authorized for detection and/or diagnosis of SARS-CoV-2 by FDA under an Emergency Use Authorization (EUA). This EUA will remain in effect (meaning this test can be used) for the duration of the COVID-19 declaration under Section 564(b)(1) of the Act, 21 U.S.C. section 360bbb-3(b)(1), unless the authorization is terminated or revoked.  Performed at [facility], [address], [place], [place] [address]       Radiology Studies last 3 days: DG Chest 1 View Result Date: 06/08/2023 CLINICAL DATA:  Congestive heart failure, shortness of breath. EXAM: CHEST  1 VIEW COMPARISON:  June 07, 2023. FINDINGS: Stable cardiomegaly. Probable bilateral pulmonary edema is noted with associated bibasilar atelectasis and small pleural effusions. Tracheostomy tube is unchanged. Bony thorax is unremarkable. IMPRESSION: Stable cardiomegaly with probable bilateral pulmonary edema and associated bibasilar atelectasis and small pleural effusions. Electronically Signed   By: Lupita Raider M.D.   On: 06/08/2023 08:00   DG Chest 2 View Result Date: 06/07/2023 CLINICAL DATA:  Shortness of breath. EXAM: CHEST - 2 VIEW COMPARISON:  05/14/2023. FINDINGS: Low lung volume. There is diffuse moderate pulmonary vascular congestion/edema. Bilateral lung fields are clear. Bilateral costophrenic angles are clear. Stable moderately enlarged cardio-mediastinal silhouette. No acute osseous abnormalities. The soft tissues are within normal limits. Endotracheal tube is seen with its tip approximately 3.7 cm above the carina. IMPRESSION: 1. Moderate pulmonary vascular congestion/edema. 2. Endotracheal tube is seen with its tip approximately 3.7 cm above the carina. Electronically  Signed   By: Jules Schick M.D.   On: 06/07/2023 11:46    Critical care time 35 min      Sunnie Nielsen, DO [facility] 06/10/2023, 2:06 PM    Dictation software may have been used to generate the above note. Typos may occur and escape review in typed/dictated notes. Please contact Dr Lyn Hollingshead directly for clarity if needed.  Staff may message me via secure chat in Epic  but this may not  receive an immediate response,  please page me for urgent matters!  If 7PM-7AM, please contact night coverage [email or web address]

## 2023-06-10 NOTE — Plan of Care (Signed)
  Problem: Health Behavior/Discharge Planning: Goal: Ability to manage health-related needs will improve Outcome: Progressing   Problem: Clinical Measurements: Goal: Ability to maintain clinical measurements within normal limits will improve Outcome: Progressing   Problem: Clinical Measurements: Goal: Diagnostic test results will improve Outcome: Progressing   Problem: Coping: Goal: Level of anxiety will decrease Outcome: Progressing

## 2023-06-10 NOTE — Progress Notes (Signed)
 Patient alert and orientated. Talked with patient about his respiratory numbers not looking any better and that the NP would like to change out patient's trach for a cuffed trach and place patient on ventilatory. Patient very forcefully said "he was not going on the vent and his breathing was fine." I made sure the patient fully understood what we were discussing. Told NP about said discussion.
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
## 2023-06-10 NOTE — Progress Notes (Signed)
 Pt transported to ICU on Bipap without incident. Pt remains on Bipap and is tol well. Report given to ICU RT.

## 2023-06-10 NOTE — Plan of Care (Signed)
  Problem: Education: Goal: Knowledge of General Education information will improve Description: Including pain rating scale, medication(s)/side effects and non-pharmacologic comfort measures Outcome: Not Progressing   Problem: Health Behavior/Discharge Planning: Goal: Ability to manage health-related needs will improve Outcome: Not Progressing   Problem: Clinical Measurements: Goal: Ability to maintain clinical measurements within normal limits will improve Outcome: Not Progressing Goal: Will remain free from infection Outcome: Not Progressing Goal: Diagnostic test results will improve Outcome: Not Progressing Goal: Respiratory complications will improve Outcome: Not Progressing Goal: Cardiovascular complication will be avoided Outcome: Not Progressing   Problem: Activity: Goal: Risk for activity intolerance will decrease Outcome: Not Progressing   Problem: Nutrition: Goal: Adequate nutrition will be maintained Outcome: Not Progressing   Problem: Coping: Goal: Level of anxiety will decrease Outcome: Not Progressing   Problem: Elimination: Goal: Will not experience complications related to bowel motility Outcome: Not Progressing Goal: Will not experience complications related to urinary retention Outcome: Not Progressing   Problem: Pain Managment: Goal: General experience of comfort will improve and/or be controlled Outcome: Not Progressing   Problem: Safety: Goal: Ability to remain free from injury will improve Outcome: Not Progressing   Problem: Skin Integrity: Goal: Risk for impaired skin integrity will decrease Outcome: Not Progressing

## 2023-06-10 NOTE — Progress Notes (Signed)
 Pt is noted with increased work of breathing and low oxygen levels.Oxygen is increased from 6l to 8l with mnimal improvement of respiratory status. Respiratory team is paged and is enroute to bedside. This RN remains at bedside with patient at this time.

## 2023-06-10 NOTE — Plan of Care (Signed)

## 2023-06-10 NOTE — Progress Notes (Signed)
 OT Cancellation Note  Patient Details Name: CHAWN SPRAGGINS MRN: [id number] DOB: [birth year]   Cancelled Treatment:    Reason Eval/Treat Not Completed: Medical issues which prohibited therapy. Chart reviewed, pt recently placed on bipap and RN at bedside agreeable to holding therapy at this time. Will re-attempts as pt appropriate.   Kathie Dike, M.S. OTR/L  06/10/23, 1:18 PM  ascom [phone]

## 2023-06-10 NOTE — Progress Notes (Signed)
 MD notified of patient status. Pt reamains oriented x4 but is very lethargic. Respiratory therapist Riane has placed patient back on Bipap. MD has ordered ABGs and chest x-ray

## 2023-06-10 NOTE — Progress Notes (Addendum)
 eLink Physician-Brief Progress Note Patient Name: Gary [name] DOB: May 11, 1955 MRN: [id number]   Date of Service  06/10/2023  HPI/Events of Note  67/M with advanced COPD, has chronic trache that is capped and with hx of  recurrent hemoptysis, CHF, brought in due to acute hypoxic and hypercapnic respiratory failure.  Pt is on BIPAP.  BP 126/83, HR 84, RR 22, O2 sats 98%  eICU Interventions  Pt is tolerating BIPAP, AVAP setting. Follow up repeat VBG tonight. ABG in the AM.  Duonebs around the clock. Lovenox for DVT prophylaxis.       Intervention Category Evaluation Type: New Patient Evaluation  Larinda Buttery 06/10/2023, 9:10 PM

## 2023-06-10 NOTE — Progress Notes (Signed)
 PT Cancellation Note  Patient Details Name: Gary [name] MRN: [id number] DOB: [birth year]   Cancelled Treatment:    Reason Eval/Treat Not Completed: Medical issues which prohibited therapy. Orders Received, Chart Reviewed. Patient recently placed on Bipap. Will hold and re-attempt at later date/time as medically appropriate.   Creed Copper Fairly, PT, DPT 06/10/23 1:22 PM

## 2023-06-11 DIAGNOSIS — I5033 Acute on chronic diastolic (congestive) heart failure: Secondary | ICD-10-CM | POA: Diagnosis not present

## 2023-06-11 LAB — BLOOD GAS, VENOUS
Acid-Base Excess: 10.8 mmol/L — ABNORMAL HIGH (ref 0.0–2.0)
Bicarbonate: 40.8 mmol/L — ABNORMAL HIGH (ref 20.0–28.0)
Delivery systems: POSITIVE
FIO2: 60 %
O2 Saturation: 86.7 %
Patient temperature: 37
RATE: 20 {breaths}/min
pCO2, Ven: 83 mm[Hg] (ref 44–60)
pH, Ven: 7.3 (ref 7.25–7.43)
pO2, Ven: 55 mm[Hg] — ABNORMAL HIGH (ref 32–45)

## 2023-06-11 LAB — CBC
HCT: 30.7 % — ABNORMAL LOW (ref 39.0–52.0)
Hemoglobin: 7.8 g/dL — ABNORMAL LOW (ref 13.0–17.0)
MCH: 19.1 pg — ABNORMAL LOW (ref 26.0–34.0)
MCHC: 25.4 g/dL — ABNORMAL LOW (ref 30.0–36.0)
MCV: 75.1 fL — ABNORMAL LOW (ref 80.0–100.0)
Platelets: 146 10*3/uL — ABNORMAL LOW (ref 150–400)
RBC: 4.09 MIL/uL — ABNORMAL LOW (ref 4.22–5.81)
RDW: 20.1 % — ABNORMAL HIGH (ref 11.5–15.5)
WBC: 5.3 10*3/uL (ref 4.0–10.5)
nRBC: 1 % — ABNORMAL HIGH (ref 0.0–0.2)

## 2023-06-11 LAB — GLUCOSE, CAPILLARY
Glucose-Capillary: 75 mg/dL (ref 70–99)
Glucose-Capillary: 85 mg/dL (ref 70–99)

## 2023-06-11 LAB — MAGNESIUM: Magnesium: 2.4 mg/dL (ref 1.7–2.4)

## 2023-06-11 LAB — BASIC METABOLIC PANEL
Anion gap: 10 (ref 5–15)
BUN: 25 mg/dL — ABNORMAL HIGH (ref 8–23)
CO2: 35 mmol/L — ABNORMAL HIGH (ref 22–32)
Calcium: 8.5 mg/dL — ABNORMAL LOW (ref 8.9–10.3)
Chloride: 95 mmol/L — ABNORMAL LOW (ref 98–111)
Creatinine, Ser: 1.22 mg/dL (ref 0.61–1.24)
GFR, Estimated: >60 mL/min (ref >60)
Glucose, Bld: 82 mg/dL (ref 70–99)
Potassium: 3.5 mmol/L (ref 3.5–5.1)
Sodium: 140 mmol/L (ref 135–145)

## 2023-06-11 LAB — PHOSPHORUS: Phosphorus: 2.8 mg/dL (ref 2.5–4.6)

## 2023-06-11 MED ORDER — ORAL CARE MOUTH RINSE: 15.0000 mL | OROMUCOSAL | Status: DC | PRN

## 2023-06-11 MED ORDER — ORAL CARE MOUTH RINSE
15.0000 mL | OROMUCOSAL | Status: DC
  Administered 2023-06-11 – 2023-06-14 (×6): 15 mL via OROMUCOSAL

## 2023-06-11 MED ORDER — LOPERAMIDE HCL 2 MG PO CAPS
4.0000 mg | ORAL_CAPSULE | ORAL | Status: DC | PRN
  Administered 2023-06-11 (×2): 4 mg via ORAL
  Filled 2023-06-11 (×2): qty 2

## 2023-06-11 MED ORDER — SPIRONOLACTONE 25 MG PO TABS
25.0000 mg | ORAL_TABLET | Freq: Every day | ORAL | Status: DC
  Administered 2023-06-12 – 2023-06-15 (×4): 25 mg via ORAL
  Filled 2023-06-11 (×4): qty 1

## 2023-06-11 MED ORDER — TORSEMIDE 20 MG PO TABS
40.0000 mg | ORAL_TABLET | Freq: Two times a day (BID) | ORAL | Status: DC
  Administered 2023-06-11 – 2023-06-15 (×8): 40 mg via ORAL
  Filled 2023-06-11 (×8): qty 2

## 2023-06-11 NOTE — Evaluation (Signed)
 Occupational Therapy Evaluation Patient Details Name: Gary [name] MRN: [id number] DOB: 30-Dec-1955 Today's Date: 06/11/2023   History of Present Illness   Patient is a 68 year old male with progressive hypoxemic hypercapnic failure. History of prolonged hospitalizations for chronic respiratory insufficiency with tracheostomy status and recurrent hemoptysis, COPD, CHF, CKD, IDA, morbid obesity, deconditioning    Clinical Impressions Gary [name] was seen for OT evaluation this date. Prior to hospital admission, pt was MOD I for limited household distances. Pt lives alone, reports plan to move to handicap accessible apartment in [place] and obtain power wheelchair. Pt currently requires  SUPERVISION seated grooming tasks, MAX A don B socks. MOD A x2 lateral scoot along EOB, pt defers standing attempts. Sp02 87% on 4 L02 via nasal canula. Pt would benefit from skilled OT to address noted impairments and functional limitations (see below for any additional details). Upon hospital discharge, recommend OT follow up.     If plan is discharge home, recommend the following:   A little help with walking and/or transfers;A lot of help with bathing/dressing/bathroom     Functional Status Assessment   Patient has had a recent decline in their functional status and demonstrates the ability to make significant improvements in function in a reasonable and predictable amount of time.     Equipment Recommendations   None recommended by OT     Recommendations for Other Services         Precautions/Restrictions   Precautions Precautions: Fall Recall of Precautions/Restrictions: Intact Restrictions Weight Bearing Restrictions Per Provider Order: No     Mobility Bed Mobility Overal bed mobility: Needs Assistance Bed Mobility: Supine to Sit, Sit to Supine     Supine to sit: Min assist Sit to supine: Min assist   General bed mobility comments: assistance for BLE. cues for  sequencing    Transfers Overall transfer level: Needs assistance   Transfers: Bed to chair/wheelchair/BSC            Lateral/Scoot Transfers: Mod assist, +2 physical assistance General transfer comment: cues for lateral scooting to the left. patient feels too weak to attempt standing today      Balance Overall balance assessment: Needs assistance Sitting-balance support: No upper extremity supported Sitting balance-[name] Scale: Good                                     ADL either performed or assessed with clinical judgement   ADL Overall ADL's : Needs assistance/impaired                                       General ADL Comments: SUPERVISION seated grooming tasks, MAX A don B socks.          Extremity/Trunk Assessment Upper Extremity Assessment Upper Extremity Assessment: Overall WFL for tasks assessed   Lower Extremity Assessment Lower Extremity Assessment: Generalized weakness       Communication Communication Communication: No apparent difficulties   Cognition Arousal: Alert Behavior During Therapy: WFL for tasks assessed/performed                                 Following commands: Intact       Cueing  General Comments   Cueing Techniques: Verbal cues  Exercises     Shoulder Instructions      Home Living Family/patient expects to be discharged to:: Private residence Living Arrangements: Alone Available Help at Discharge: Available PRN/intermittently;Family;Personal care attendant Type of Home: Apartment Home Access: Level entry;Ramped entrance     Home Layout: One level     Bathroom Shower/Tub: Chief Strategy Officer: Handicapped height         Additional Comments: patient has a daily aide for ~ 4 hours a day      Prior Functioning/Environment Prior Level of Function : Needs assist             Mobility Comments: short distance ambulation with rolling  walker ADLs Comments: intermittent assistance for ADLs    OT Problem List: Decreased strength;Decreased range of motion;Decreased activity tolerance;Impaired balance (sitting and/or standing);Decreased safety awareness;Decreased knowledge of use of DME or AE;Decreased knowledge of precautions;Cardiopulmonary status limiting activity;Increased edema   OT Treatment/Interventions: Self-care/ADL training;Therapeutic exercise;DME and/or AE instruction;Therapeutic activities;Patient/family education;Balance training      OT Goals(Current goals can be found in the care plan section)   Acute Rehab OT Goals Patient Stated Goal: to go home OT Goal Formulation: With patient Time For Goal Achievement: 06/25/23 Potential to Achieve Goals: Good ADL Goals Pt Will Perform Grooming: with modified independence;standing Pt Will Transfer to Toilet: with modified independence;ambulating;regular height toilet Pt Will Perform Toileting - Clothing Manipulation and hygiene: with modified independence;with adaptive equipment;sit to/from stand   OT Frequency:  Min 1X/week    Co-evaluation PT/OT/SLP Co-Evaluation/Treatment: Yes Reason for Co-Treatment: To address functional/ADL transfers PT goals addressed during session: Mobility/safety with mobility OT goals addressed during session: ADL's and self-care      AM-PAC OT "6 Clicks" Daily Activity     Outcome Measure Help from another person eating meals?: None Help from another person taking care of personal grooming?: A Little Help from another person toileting, which includes using toliet, bedpan, or urinal?: A Lot Help from another person bathing (including washing, rinsing, drying)?: A Lot Help from another person to put on and taking off regular upper body clothing?: None Help from another person to put on and taking off regular lower body clothing?: A Lot 6 Click Score: 17   End of Session    Activity Tolerance: Patient tolerated treatment  well Patient left: in bed;with call bell/phone within reach  OT Visit Diagnosis: Unsteadiness on feet (R26.81);Other abnormalities of gait and mobility (R26.89);Muscle weakness (generalized) (M62.81);Adult, failure to thrive (R62.7)                Time: 9811-9147 OT Time Calculation (min): 23 min Charges:  OT General Charges $OT Visit: 1 Visit OT Evaluation $OT Eval Moderate Complexity: 1 Mod  Kathie Dike, M.S. OTR/L  06/11/23, 3:20 PM  ascom [phone]

## 2023-06-11 NOTE — TOC Initial Note (Signed)
 Transition of Care [facility]) - Initial/Assessment Note    Patient Details  Name: Gary [name] MRN: [id number] Date of Birth: 10-21-1955  Transition of Care [facility]) CM/SW Contact:    Margarito Liner, LCSW Phone Number: 06/11/2023, 1:50 PM  Clinical Narrative:  Per notes from last admission, patient was going to have a PCP come to his home. CSW contact [facility] who stated they are unable to accept him back. Patient lives outside the service area of the in-home PCP. They only go to [place] and patient lives in [place]. Patient was not going to office visits for PCP care s he has no PCP to sign his home health orders. Attending physician, TOC supervisor, and [facility] director are aware.                Expected Discharge Plan: Home/Self Care Barriers to Discharge: Continued Medical Work up   Patient Goals and CMS Choice            Expected Discharge Plan and Services       Living arrangements for the past 2 months: Apartment                                      Prior Living Arrangements/Services Living arrangements for the past 2 months: Apartment   Patient language and need for interpreter reviewed:: Yes        Need for Family Participation in Patient Care: Yes (Comment)   Current home services: DME Criminal Activity/Legal Involvement Pertinent to Current Situation/Hospitalization: No - Comment as needed  Activities of Daily Living   ADL Screening (condition at time of admission) Independently performs ADLs?: No Does the patient have a NEW difficulty with bathing/dressing/toileting/self-feeding that is expected to last >3 days?: No Does the patient have a NEW difficulty with getting in/out of bed, walking, or climbing stairs that is expected to last >3 days?: No Does the patient have a NEW difficulty with communication that is expected to last >3 days?: No Is the patient deaf or have difficulty hearing?: No Does the patient have difficulty seeing, even  when wearing glasses/contacts?: No Does the patient have difficulty concentrating, remembering, or making decisions?: No  Permission Sought/Granted                  Emotional Assessment       Orientation: : Oriented to Self, Oriented to Place, Oriented to  Time, Oriented to Situation Alcohol / Substance Use: Not Applicable Psych Involvement: No (comment)  Admission diagnosis:  Morbid obesity ([name]) [E66.01] CHF (congestive heart failure) ([name]) [I50.9] 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.0] Acute on chronic respiratory failure with hypoxia ([name]) [J96.21] Patient Active Problem List   Diagnosis Date Noted   CHF (congestive heart failure) ([name]) 06/07/2023   Diarrhea 05/17/2023   Rectal bleeding 05/16/2023   Tracheitis 02/26/2023   Dry skin 01/25/2023   Eyelid cyst, right 01/23/2023   Fluid overload 11/30/2022   Acute on chronic respiratory failure ([name]) 11/02/2022   History of DVT (deep vein thrombosis) 10/27/2022   Acute kidney injury superimposed on CKD ([name]) 10/27/2022   Acute on chronic respiratory failure with hypoxia ([name]) 10/17/2022   Chronic colitis 09/12/2022   Segmental colitis associated with diverticulosis ([name]) 09/12/2022   Diverticulosis of colon with hemorrhage 09/10/2022   Acute blood loss anemia 09/08/2022   Acute GI bleeding 09/07/2022   Chest pain 08/13/2022   Influenza A 04/11/2022  Hypokalemia 03/06/2022   Chronic obstructive pulmonary disease (COPD) ([name]) 02/03/2022   GERD without esophagitis 02/03/2022   Anxiety 02/03/2022   Melena    Major depressive disorder, recurrent episode, moderate ([name]) 10/22/2021   Pressure injury of skin 09/27/2021   COPD (chronic obstructive pulmonary disease) ([name])    Iron deficiency anemia    Generalized weakness    CAP (community acquired pneumonia) due to Pneumococcus ([name]) 03/11/2021   (HFpEF) heart failure with preserved ejection fraction ([name])    Hyperlipidemia 02/19/2021   Generalized osteoarthritis of  multiple sites 10/31/2020   Hyperkalemia 03/04/2020   Marijuana use 01/17/2020   Thrombocytopenia ([name]) 01/17/2020   Positive hepatitis C antibody test 01/17/2020   Osteoarthritis of glenohumeral joints (Bilateral) 01/17/2020   Osteoarthritis of AC (acromioclavicular) joints (Bilateral) 01/17/2020   Polysubstance abuse ([name]) 08/25/2019   Toxic metabolic encephalopathy 08/25/2019   Chronic pain syndrome 07/14/2019   Anticoagulated 07/14/2019   DDD (degenerative disc disease), lumbosacral 07/14/2019   DDD (degenerative disc disease), cervical 07/14/2019   Acute on chronic respiratory failure with hypoxia and hypercapnia ([name]) 05/28/2018   Acute on chronic diastolic CHF (congestive heart failure) ([name]) 02/28/2015   OSA (obstructive sleep apnea) 02/28/2015   Morbid obesity with BMI of 50.0-59.9, adult ([name]) 04/01/2014   Hypotension 04/01/2014   PCP:  Shayne Alken, MD Pharmacy:   [facility] - [place], [place] - [address] [place] [address] Phone: [phone] Fax: [phone]  [facility] [address] [place] [address] Phone: [phone] Fax: [phone]  [facility] [address]), [place] - [address] [place]) [place] [address] Phone: [phone] Fax: [phone]     Social Drivers of Health (SDOH) Social History: SDOH Screenings   Food Insecurity: No Food Insecurity (06/07/2023)  Housing: Low Risk  (06/07/2023)  Transportation Needs: No Transportation Needs (06/07/2023)  Utilities: Not At Risk (06/07/2023)  Depression (PHQ2-9): Low Risk  (06/07/2021)  Financial Resource Strain: High Risk (11/21/2022)   Received from [facility]  Physical Activity: Insufficiently Active (05/03/2017)  Social Connections: Moderately Isolated (06/07/2023)  Stress: Stress Concern Present (11/20/2022)   Received from [facility]  Tobacco  Use: Medium Risk (06/07/2023)   SDOH Interventions:     Readmission Risk Interventions    03/19/2023    9:58 AM 11/05/2022    2:59 PM 11/05/2022   12:16 PM  Readmission Risk Prevention Plan  Transportation Screening Complete Complete Complete  Medication Review Oceanographer) Complete Complete Complete  PCP or Specialist appointment within 3-5 days of discharge Complete Complete Complete  HRI or Home Care Consult Complete Complete Complete  SW Recovery Care/Counseling Consult Complete Complete Complete  Palliative Care Screening Complete Complete Complete  Skilled Nursing Facility Not Applicable Not Applicable Not Applicable

## 2023-06-11 NOTE — Progress Notes (Signed)
 PROGRESS NOTE    Gary [name]   UYQ:[id number] DOB: March 06, 1956  DOA: 06/07/2023 Date of Service: 06/11/23 which is hospital day 4  PCP: Shayne Alken, MD    Hospital course / significant events:   HPI: Gary [name] is a 68 y.o. 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 Multiple hospitalizations for same. Patient reported that he has been congested for last 2 weeks with occasional cough of whitish phlegm increasing shortness of breath. Also has had subjective fever no chills.   02/21: in ED, tachypneic, SpO2 89% on 6L, 99% when in creased to 10L. CXR mod-severe pulm vasc congestion. Vanc + cefepime, Lasix 80 mg IV x1. Admitted to hospitalist service. Continued on diuresis, held further abx low procalcitonin.  02/22: continue diuresis and O2 support  02/23: Cr up, will dc IV diuresis given AKI and plan transition back to his po home meds tomorrow. If doing well on po meds anticipate can discharge. He requests to see his pulmonologist tomorrow as well if possible  02/24: alert this morning and doing well, fairly sudden decline early afternoon, somnolent, hypercarbic, CXR appears stable cardiomegaly and pulm vasc congestion. BiPap placed, PCCU eval, transfer to SDU for close monitoring, thankfully improved after some time on BiPap, was declining ventilator. Renal functio better today 02/25: staying alert through today, VSS, participating some w/ therapy, declining to walk. Pt reports he would like to discharge tomorrow if possible as this is when he will have a place to go, moving to [employer], says he will try walking tomorrow. Has been refusing Glc checks.       Consultants:  none  Procedures/Surgeries: none      ASSESSMENT & PLAN:   Acute on chronic hypoxic and hypercapnic respiratory failure Acute on chronic HFpEF decompensation Diuresis - restarted home meds  Echocardiogram was done several times last year will not repeat echo at this time. Other DDx, patient was hospitalized last week when he was diagnosed with pneumonia and treated with full course of antibiotics.  Currently given his symptoms and x-ray findings showing bilateral pulmonary congestion as well as low procalcitonin, low suspicion for recurrent pneumonia. Defer abx to PCCM - has not needed abx   Acute COPD exacerbation Received 3 rounds of DuoNebs, less wheezing Pulmicort, ICS and LABA As needed DuoNebs   Chronic normocytic anemia H&H stable, no signs of active bleeding Colonoscopy last year showed chronic colitis. Reports diarrhea Monitor CBC   AKI on CKD stage IIIa likely d/t diuresis - resolved Careful diuresis, resuemd home meds    Class 3 morbid obesity based on BMI: Body mass index is 47.47 kg/m.  Underweight - under 18  overweight - 25 to 29 obese - 30 or more Class 1 obesity: BMI of 30.0 to 34 Class 2 obesity: BMI of 35.0 to 39 Class 3 obesity: BMI of 40.0 to 49 Super Morbid Obesity: BMI 50-59 Super-super Morbid Obesity: BMI 60+ Significantly low or high BMI is associated with higher medical risk.  Weight management advised as adjunct to other disease management and risk reduction treatments    DVT prophylaxis: lovenox  IV fluids: no continuous IV fluids  Nutrition: carb/cardiac diet  Central lines / invasive devices: none  Code Status: FULL  CODE ACP documentation reviewed:  none on file in [facility]  TOC needs: TBD Barriers to dispo / significant pending items: diuresing, anticipate stable tomorrow              Subjective / Brief ROS:  Patient reports this morning no CP, still trouble breathing if he sits up too straight.  Better laying 30 deg HOB Decliens to walk today says he will tomorrow Pain controlled.  Denies new weakness.   Family Communication: none     Objective Findings:  Vitals:   06/11/23 0900 06/11/23 1000 06/11/23 1100 06/11/23 1200  BP: 124/71 (!) 129/97  134/87  Pulse: 87 85 82 81  Resp: (!) 29 (!) 27 (!) 28 (!) 27  Temp:    98.5 F (36.9 C)  TempSrc:    Axillary  SpO2: 96% 92% 95% 100%  Weight:      Height:        Intake/Output Summary (Last 24 hours) at 06/11/2023 1557 Last data filed at 06/11/2023 1335 Gross per 24 hour  Intake 940 ml  Output 1450 ml  Net -510 ml   Filed Weights   06/09/23 0500 06/10/23 0500 06/11/23 0500  Weight: (!) 185.3 kg (!) 185.6 kg (!) 186 kg    Examination:  Physical Exam Constitutional:      General: He is not in acute distress.    Appearance: He is obese.  Cardiovascular:     Rate and Rhythm: Normal rate and regular rhythm.  Pulmonary:     Effort: Pulmonary effort is normal. No tachypnea, accessory muscle usage or respiratory distress.     Breath sounds: Decreased breath sounds present. No rales.  Musculoskeletal:     Right lower leg: No edema.     Left lower leg: No edema.  Skin:    General: Skin is warm and dry.  Neurological:     General: No focal deficit present.     Mental Status: He is alert and oriented to person, place, and time.  Psychiatric:        Mood and Affect: Mood normal.        Behavior: Behavior normal.          Scheduled Medications:   amphetamine-dextroamphetamine  30 mg Oral Q breakfast   budesonide (PULMICORT) nebulizer solution  0.25 mg Nebulization BID   dextromethorphan-guaiFENesin  1 tablet Oral BID   enoxaparin (LOVENOX) injection  0.5 mg/kg Subcutaneous Q24H   escitalopram  10 mg Oral Daily   ipratropium-albuterol  3 mL Nebulization BID   mesalamine  1.2 g Oral BID   mometasone-formoterol  2 puff Inhalation BID   mouth rinse  15 mL Mouth Rinse 4 times per day   pantoprazole (PROTONIX) IV   40 mg Intravenous QHS   senna-docusate  2 tablet Oral QHS   sodium chloride flush  3 mL Intravenous Q12H   [START ON 06/12/2023] spironolactone  25 mg Oral Daily   torsemide  40 mg Oral BID    Continuous Infusions:    PRN Medications:  acetaminophen, bisacodyl, hydrOXYzine, ipratropium-albuterol, loperamide, menthol-cetylpyridinium, ondansetron (ZOFRAN) IV, mouth rinse, oxyCODONE, polyvinyl alcohol, sodium chloride flush, traZODone  Antimicrobials from admission:  Anti-infectives (From admission, onward)    Start     Dose/Rate Route Frequency Ordered Stop   06/07/23 1330  cefTRIAXone (ROCEPHIN) 2 g in sodium chloride 0.9 % 100 mL IVPB        2 g 200 mL/hr over 30 Minutes Intravenous Once 06/07/23 1322 06/07/23 1439   06/07/23  1330  azithromycin (ZITHROMAX) 500 mg in sodium chloride 0.9 % 250 mL IVPB        500 mg 250 mL/hr over 60 Minutes Intravenous  Once 06/07/23 1322 06/07/23 1459           Data Reviewed:  I have personally reviewed the following...  CBC: Recent Labs  Lab 06/07/23 1224 06/09/23 0614 06/11/23 0456  WBC 5.9 4.6 5.3  HGB 8.2* 8.5* 7.8*  HCT 33.4* 33.7* 30.7*  MCV 79.5* 76.6* 75.1*  PLT 141* 139* 146*   Basic Metabolic Panel: Recent Labs  Lab 06/07/23 1224 06/08/23 0625 06/09/23 0614 06/10/23 0606 06/11/23 0456  NA 142 142 140 138 140  K 3.9 4.2 4.2 3.7 3.5  CL 97* 99 96* 98 95*  CO2 38* 37* 37* 35* 35*  GLUCOSE 90 108* 101* 94 82  BUN 18 25* 34* 30* 25*  CREATININE 1.10 1.25* 1.52* 1.26* 1.22  CALCIUM 8.3* 8.4* 8.4* 8.4* 8.5*  MG  --   --   --   --  2.4  PHOS  --   --   --   --  2.8   GFR: Estimated Creatinine Clearance: 108 mL/min (by C-G formula based on SCr of 1.22 mg/dL). Liver Function Tests: No results for input(s): "AST", "ALT", "ALKPHOS", "BILITOT", "PROT", "ALBUMIN" in the last 168 hours. No results for input(s): "LIPASE", "AMYLASE" in the last 168 hours. No results for input(s): "AMMONIA" in the last 168  hours. Coagulation Profile: No results for input(s): "INR", "PROTIME" in the last 168 hours. Cardiac Enzymes: No results for input(s): "CKTOTAL", "CKMB", "CKMBINDEX", "TROPONINI" in the last 168 hours. BNP (last 3 results) No results for input(s): "PROBNP" in the last 8760 hours. HbA1C: No results for input(s): "HGBA1C" in the last 72 hours. CBG: Recent Labs  Lab 06/10/23 2041 06/10/23 2334 06/11/23 0351 06/11/23 0721  GLUCAP 87 73 75 85   Lipid Profile: No results for input(s): "CHOL", "HDL", "LDLCALC", "TRIG", "CHOLHDL", "LDLDIRECT" in the last 72 hours. Thyroid Function Tests: No results for input(s): "TSH", "T4TOTAL", "FREET4", "T3FREE", "THYROIDAB" in the last 72 hours. Anemia Panel: No results for input(s): "VITAMINB12", "FOLATE", "FERRITIN", "TIBC", "IRON", "RETICCTPCT" in the last 72 hours. Most Recent Urinalysis On File:     Component Value Date/Time   COLORURINE YELLOW 11/07/2022 2151   APPEARANCEUR CLEAR 11/07/2022 2151   APPEARANCEUR Clear 04/15/2014 1240   LABSPEC 1.013 11/07/2022 2151   LABSPEC 1.013 04/15/2014 1240   PHURINE 5.0 11/07/2022 2151   GLUCOSEU NEGATIVE 11/07/2022 2151   GLUCOSEU Negative 04/15/2014 1240   HGBUR NEGATIVE 11/07/2022 2151   BILIRUBINUR NEGATIVE 11/07/2022 2151   BILIRUBINUR Negative 04/15/2014 1240   KETONESUR NEGATIVE 11/07/2022 2151   PROTEINUR NEGATIVE 11/07/2022 2151   NITRITE NEGATIVE 11/07/2022 2151   LEUKOCYTESUR NEGATIVE 11/07/2022 2151   LEUKOCYTESUR Negative 04/15/2014 1240   Sepsis Labs: @LABRCNTIP (procalcitonin:4,lacticidven:4) Microbiology: Recent Results (from the past 240 hours)  Resp panel by RT-PCR (RSV, Flu A&B, Covid) Anterior Nasal Swab     Status: None   Collection Time: 06/07/23  1:21 PM   Specimen: Anterior Nasal Swab  Result Value Ref Range Status   SARS Coronavirus 2 by RT PCR NEGATIVE NEGATIVE Final    Comment: (NOTE) SARS-CoV-2 target nucleic acids are NOT DETECTED.  The SARS-CoV-2 RNA is  generally detectable in upper respiratory specimens during the acute phase of infection. The lowest concentration of SARS-CoV-2 viral copies this assay can detect is 138 copies/mL. A negative result does not preclude SARS-Cov-2 infection  and should not be used as the sole basis for treatment or other patient management decisions. A negative result may occur with  improper specimen collection/handling, submission of specimen other than nasopharyngeal swab, presence of viral mutation(s) within the areas targeted by this assay, and inadequate number of viral copies(<138 copies/mL). A negative result must be combined with clinical observations, patient history, and epidemiological information. The expected result is Negative.  Fact Sheet for Patients:  BloggerCourse.com  Fact Sheet for Healthcare Providers:  SeriousBroker.it  This test is no t yet approved or cleared by the [place] FDA and  has been authorized for detection and/or diagnosis of SARS-CoV-2 by FDA under an Emergency Use Authorization (EUA). This EUA will remain  in effect (meaning this test can be used) for the duration of the COVID-19 declaration under Section 564(b)(1) of the Act, 21 U.S.C.section 360bbb-3(b)(1), unless the authorization is terminated  or revoked sooner.       Influenza A by PCR NEGATIVE NEGATIVE Final   Influenza B by PCR NEGATIVE NEGATIVE Final    Comment: (NOTE) The Xpert Xpress SARS-CoV-2/FLU/RSV plus assay is intended as an aid in the diagnosis of influenza from Nasopharyngeal swab specimens and should not be used as a sole basis for treatment. Nasal washings and aspirates are unacceptable for Xpert Xpress SARS-CoV-2/FLU/RSV testing.  Fact Sheet for Patients: BloggerCourse.com  Fact Sheet for Healthcare Providers: SeriousBroker.it  This test is not yet approved or cleared by the [place] FDA and has been authorized for detection and/or diagnosis of SARS-CoV-2 by FDA under an Emergency Use Authorization (EUA). This EUA will remain in effect (meaning this test can be used) for the duration of the COVID-19 declaration under Section 564(b)(1) of the Act, 21 U.S.C. section 360bbb-3(b)(1), unless the authorization is terminated or revoked.     Resp Syncytial Virus by PCR NEGATIVE NEGATIVE Final    Comment: (NOTE) Fact Sheet for Patients: BloggerCourse.com  Fact Sheet for Healthcare Providers: SeriousBroker.it  This test is not yet approved or cleared by the [place] FDA and has been authorized for detection and/or diagnosis of SARS-CoV-2 by FDA under an Emergency Use Authorization (EUA). This EUA will remain in effect (meaning this test can be used) for the duration of the COVID-19 declaration under Section 564(b)(1) of the Act, 21 U.S.C. section 360bbb-3(b)(1), unless the authorization is terminated or revoked.  Performed at [facility], [address], [place], [place] [address]       Radiology Studies last 3 days: [facility] 1 View Result Date: 06/10/2023 CLINICAL DATA:  Short of breath EXAM: PORTABLE CHEST 1 VIEW COMPARISON:  06/08/2023 FINDINGS: Single frontal view of the chest demonstrates tracheostomy tube overlying tracheal air column, tip 4.5 cm above carina. Cardiac silhouette is enlarged. Continued ectasia of the thoracic aorta. Low lung volumes, with vascular crowding. Bibasilar veiling opacities are unchanged. No pneumothorax. No acute bony abnormalities. IMPRESSION: 1. Vascular congestion, with stable bibasilar consolidation and effusions. Findings may reflect congestive heart failure given pronounced enlargement of the cardiac silhouette. No change since prior study. Electronically Signed   By: Sharlet Salina M.D.   On: 06/10/2023 15:13   DG Chest 1 View Result Date:  06/08/2023 CLINICAL DATA:  Congestive heart failure, shortness of breath. EXAM: CHEST  1 VIEW COMPARISON:  June 07, 2023. FINDINGS: Stable cardiomegaly. Probable bilateral pulmonary edema is noted with associated bibasilar atelectasis and small pleural effusions. Tracheostomy tube is unchanged. Bony thorax is unremarkable. IMPRESSION: Stable cardiomegaly with probable bilateral pulmonary  edema and associated bibasilar atelectasis and small pleural effusions. Electronically Signed   By: Lupita Raider M.D.   On: 06/08/2023 08:00    Critical care time 35 min      Sunnie Nielsen, DO [facility] 06/11/2023, 3:57 PM    Dictation software may have been used to generate the above note. Typos may occur and escape review in typed/dictated notes. Please contact Dr Lyn Hollingshead directly for clarity if needed.  Staff may message me via secure chat in Epic  but this may not receive an immediate response,  please page me for urgent matters!  If 7PM-7AM, please contact night coverage [email or web address]
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
## 2023-06-11 NOTE — Discharge Instructions (Signed)
 Some PCP options in [place] area- not a comprehensive list  [facility]- [phone] [facility]- [phone] [facility]- [phone] [facility]- [phone] [facility]- [phone] [facility]- [phone]  or [facility] Physician Referral Line [phone]

## 2023-06-11 NOTE — Plan of Care (Signed)
 Vital signs reviewed, ICU needs resolved  Will sign off at this time. No further recommendations at this time. Patient is alert and awake, eating    Lucie Leather, M.D.  [facility]  Medical Director [facility] Medical Director [facility] Cardio-Pulmonary Department

## 2023-06-11 NOTE — Evaluation (Signed)
 Physical Therapy Evaluation Patient Details Name: Gary [name] MRN: [id number] DOB: 1955/12/04 Today's Date: 06/11/2023  History of Present Illness  Patient is a 68 year old male with progressive hypoxemic hypercapnic failure. History of prolonged hospitalizations for chronic respiratory insufficiency with tracheostomy status and recurrent hemoptysis, COPD, CHF, CKD, IDA, morbid obesity, deconditioning   Clinical Impression  Patient agreeable to PT evaluation. He reports walking short distances at home with his rolling walker. He also reports having a daily aide that helps for several hours at home. He reports he will be moving to a new apartment this week.   Today the patient required some assistance for bed mobility. He feels generally weak and declined attempting to stand. Mod A +2 person for lateral scooting along edge of bed. Sp02 88% on 4 L02 via nasal canula. Recommend to continue PT to maximize independence and facilitate return to prior level of function.       If plan is discharge home, recommend the following: A little help with walking and/or transfers;A little help with bathing/dressing/bathroom;Assist for transportation;Help with stairs or ramp for entrance;Assistance with cooking/housework   Can travel by private vehicle   No    Equipment Recommendations None recommended by PT  Recommendations for Other Services       Functional Status Assessment Patient has had a recent decline in their functional status and demonstrates the ability to make significant improvements in function in a reasonable and predictable amount of time.     Precautions / Restrictions Precautions Precautions: Fall Recall of Precautions/Restrictions: Intact Restrictions Weight Bearing Restrictions Per Provider Order: No      Mobility  Bed Mobility Overal bed mobility: Needs Assistance Bed Mobility: Supine to Sit, Sit to Supine     Supine to sit: Min assist Sit to supine: Min assist    General bed mobility comments: assistance for BLE. cues for sequencing    Transfers Overall transfer level: Needs assistance   Transfers: Bed to chair/wheelchair/BSC            Lateral/Scoot Transfers: Mod assist, +2 physical assistance General transfer comment: cues for lateral scooting to the left. patient feels too weak to attempt standing today    Ambulation/Gait               General Gait Details: not attempted as patient reports feeling too weak to stand  Stairs            Wheelchair Mobility     Tilt Bed    Modified Rankin (Stroke Patients Only)       Balance Overall balance assessment: Needs assistance Sitting-balance support: No upper extremity supported Sitting balance-[name] Scale: Good                                       Pertinent Vitals/Pain Pain Assessment Pain Assessment: No/denies pain    Home Living Family/patient expects to be discharged to:: Private residence Living Arrangements: Alone Available Help at Discharge: Available PRN/intermittently;Family;Personal care attendant Type of Home: Apartment Home Access: Level entry;Ramped entrance       Home Layout: One level   Additional Comments: patient has a daily aide for ~ 4 hours a day    Prior Function Prior Level of Function : Needs assist             Mobility Comments: short distance ambulation with rolling walker ADLs Comments: intermittent assistance for ADLs  Extremity/Trunk Assessment   Upper Extremity Assessment Upper Extremity Assessment: Defer to OT evaluation    Lower Extremity Assessment Lower Extremity Assessment: Generalized weakness       Communication   Communication Communication: No apparent difficulties    Cognition Arousal: Alert Behavior During Therapy: WFL for tasks assessed/performed   PT - Cognitive impairments: No apparent impairments                         Following commands: Intact        Cueing Cueing Techniques: Verbal cues     General Comments General comments (skin integrity, edema, etc.): Sp02 88% on 4 L02 via nasal canula    Exercises     Assessment/Plan    PT Assessment Patient needs continued PT services  PT Problem List Decreased strength;Decreased range of motion;Decreased activity tolerance;Decreased balance;Decreased mobility       PT Treatment Interventions DME instruction;Gait training;Functional mobility training;Therapeutic activities;Therapeutic exercise;Balance training;Neuromuscular re-education;Cognitive remediation;Patient/family education    PT Goals (Current goals can be found in the Care Plan section)  Acute Rehab PT Goals Patient Stated Goal: to feel better and go home PT Goal Formulation: With patient Time For Goal Achievement: 06/25/23 Potential to Achieve Goals: Fair    Frequency Min 1X/week     Co-evaluation PT/OT/SLP Co-Evaluation/Treatment: Yes Reason for Co-Treatment: To address functional/ADL transfers PT goals addressed during session: Mobility/safety with mobility         AM-PAC PT "6 Clicks" Mobility  Outcome Measure Help needed turning from your back to your side while in a flat bed without using bedrails?: A Little Help needed moving from lying on your back to sitting on the side of a flat bed without using bedrails?: A Little Help needed moving to and from a bed to a chair (including a wheelchair)?: A Little Help needed standing up from a chair using your arms (e.g., wheelchair or bedside chair)?: A Little Help needed to walk in hospital room?: A Lot Help needed climbing 3-5 steps with a railing? : A Lot 6 Click Score: 16    End of Session Equipment Utilized During Treatment: Oxygen Activity Tolerance: Patient tolerated treatment well Patient left: in bed;with call bell/phone within reach   PT Visit Diagnosis: Muscle weakness (generalized) (M62.81);Unsteadiness on feet (R26.81)    Time: 1610-9604 PT Time  Calculation (min) (ACUTE ONLY): 23 min   Charges:   PT Evaluation $PT Eval Moderate Complexity: 1 Mod   PT General Charges $$ ACUTE PT VISIT: 1 Visit         [name], PT, MPT   [name] 06/11/2023, 2:56 PM

## 2023-06-12 DIAGNOSIS — J9622 Acute and chronic respiratory failure with hypercapnia: Secondary | ICD-10-CM | POA: Diagnosis not present

## 2023-06-12 DIAGNOSIS — J9621 Acute and chronic respiratory failure with hypoxia: Secondary | ICD-10-CM | POA: Diagnosis not present

## 2023-06-12 NOTE — Progress Notes (Signed)
 Occupational Therapy Treatment Patient Details Name: Gary [name] MRN: [id number] DOB: [birth year] Today's Date: 06/12/2023   History of present illness Patient is a 68 year old male with progressive hypoxemic hypercapnic failure. History of prolonged hospitalizations for chronic respiratory insufficiency with tracheostomy status and recurrent hemoptysis, COPD, CHF, CKD, IDA, morbid obesity, deconditioning   OT comments  Pt seen for OT treatment on this date. Upon arrival to room pt in bed, agreeable to tx. Pt requires SUPERVISION seated grooming tasks, MAX A don B socks. MOD I sup<>sit, increased time and rails use. SUPERVISION + RW sit<>stand x2 trials from elevated bed height - tolerated side steps and marching, deferred further mobility citing fear of falling. SpO2 84% on 4L [place], improved to 90% on 5L. Pt making good progress toward goals, will continue to follow POC. Discharge recommendation remains appropriate.        If plan is discharge home, recommend the following:  A little help with walking and/or transfers;A lot of help with bathing/dressing/bathroom   Equipment Recommendations  None recommended by OT    Recommendations for Other Services      Precautions / Restrictions Precautions Precautions: Fall Recall of Precautions/Restrictions: Intact Restrictions Weight Bearing Restrictions Per Provider Order: No       Mobility Bed Mobility Overal bed mobility: Modified Independent             General bed mobility comments: use of rails and  bed controls sup<>sit    Transfers Overall transfer level: Needs assistance Equipment used: Rolling walker (2 wheels) Transfers: Sit to/from Stand Sit to Stand: Supervision, From elevated surface           General transfer comment: x2 trials from max elevated bed height     Balance Overall balance assessment: Needs assistance Sitting-balance support: No upper extremity supported Sitting balance-[name] Scale: Normal      Standing balance support: Bilateral upper extremity supported Standing balance-[name] Scale: Fair                             ADL either performed or assessed with clinical judgement   ADL Overall ADL's : Needs assistance/impaired                                       General ADL Comments: SUPERVISION seated grooming tasks, MAX A don B socks     Communication Communication Communication: No apparent difficulties   Cognition Arousal: Alert Behavior During Therapy: WFL for tasks assessed/performed Cognition: No apparent impairments             OT - Cognition Comments: pt self-directs session and unwilling to trial standing from heights to simulate home setup                 Following commands: Intact                 General Comments SpO2 84% on 4L [place], improved to 90% on 5L    Pertinent Vitals/ Pain       Pain Assessment Pain Assessment: No/denies pain   Frequency  Min 1X/week        Progress Toward Goals  OT Goals(current goals can now be found in the care plan section)  Progress towards OT goals: Progressing toward goals  Acute Rehab OT Goals OT Goal Formulation: With patient Time For Goal Achievement: 06/25/23 Potential  to Achieve Goals: Good ADL Goals Pt Will Perform Grooming: with modified independence;standing Pt Will Transfer to Toilet: with modified independence;ambulating;regular height toilet Pt Will Perform Toileting - Clothing Manipulation and hygiene: with modified independence;with adaptive equipment;sit to/from stand  Plan      Co-evaluation    PT/OT/SLP Co-Evaluation/Treatment: Yes Reason for Co-Treatment: To address functional/ADL transfers PT goals addressed during session: Mobility/safety with mobility OT goals addressed during session: ADL's and self-care      AM-PAC OT "6 Clicks" Daily Activity     Outcome Measure   Help from another person eating meals?: None Help from another person  taking care of personal grooming?: A Little Help from another person toileting, which includes using toliet, bedpan, or urinal?: A Lot Help from another person bathing (including washing, rinsing, drying)?: A Lot Help from another person to put on and taking off regular upper body clothing?: None Help from another person to put on and taking off regular lower body clothing?: A Lot 6 Click Score: 17    End of Session Equipment Utilized During Treatment: Gait belt;Rolling walker (2 wheels)  OT Visit Diagnosis: Unsteadiness on feet (R26.81);Other abnormalities of gait and mobility (R26.89);Muscle weakness (generalized) (M62.81);Adult, failure to thrive (R62.7)   Activity Tolerance Patient tolerated treatment well   Patient Left in bed;with call bell/phone within reach   Nurse Communication          Time: 1610-9604 OT Time Calculation (min): 20 min  Charges: OT General Charges $OT Visit: 1 Visit OT Treatments $Self Care/Home Management : 8-22 mins  Kathie Dike, M.S. OTR/L  06/12/23, 3:06 PM  ascom [phone]

## 2023-06-12 NOTE — Progress Notes (Signed)
 Physical Therapy Treatment Patient Details Name: Gary [name] MRN: [id number] DOB: 12/07/1955 Today's Date: 06/12/2023   History of Present Illness Patient is a 68 year old male with progressive hypoxemic hypercapnic failure. History of prolonged hospitalizations for chronic respiratory insufficiency with tracheostomy status and recurrent hemoptysis, COPD, CHF, CKD, IDA, morbid obesity, deconditioning    PT Comments  Pt showed good effort, but on his terms (increased O2, elevated bed, not feeling ready to venture away from the bed even an inch, etc).  He was able to do multiple standing EOB and side stepping efforts, showed good effort with limited supine exercises and though he is weaker now than when he left last admittance he ultimately did well and is feeling as though he will be able to mange at home once his breathing and swelling improve to his comfort level.  Continue with POC.     If plan is discharge home, recommend the following: A little help with walking and/or transfers;A little help with bathing/dressing/bathroom;Assist for transportation;Help with stairs or ramp for entrance;Assistance with cooking/housework   Can travel by private vehicle     No  Equipment Recommendations  None recommended by PT    Recommendations for Other Services       Precautions / Restrictions Precautions Precautions: Fall Recall of Precautions/Restrictions: Intact Restrictions Weight Bearing Restrictions Per Provider Order: No     Mobility  Bed Mobility Overal bed mobility: Modified Independent             General bed mobility comments: use of rails and  bed controls sup<>sit    Transfers Overall transfer level: Needs assistance Equipment used: Rolling walker (2 wheels) Transfers: Sit to/from Stand Sit to Stand: Supervision, From elevated surface           General transfer comment: x2 trials from max elevated bed height    Ambulation/Gait               General  Gait Details: pt deferred any forward ambulation away from the bed but was able to do multiple side steps (in walker) along EOB with each standing effort and managed some marching in place (~5x b/l) on second attempt   Stairs             Wheelchair Mobility     Tilt Bed    Modified Rankin (Stroke Patients Only)       Balance Overall balance assessment: Needs assistance Sitting-balance support: No upper extremity supported Sitting balance-[name] Scale: Normal     Standing balance support: Bilateral upper extremity supported Standing balance-[name] Scale: Fair Standing balance comment: expected reliance on walker                            Communication Communication Communication: No apparent difficulties  Cognition Arousal: Alert Behavior During Therapy: WFL for tasks assessed/performed                             Following commands: Intact      Cueing    Exercises General Exercises - Lower Extremity Short Arc Quad: Strengthening, 10 reps (resisted) Heel Slides: Strengthening, 10 reps (with heavily resisted leg ext) Hip ABduction/ADduction: Strengthening, 10 reps Straight Leg Raises: AAROM, 5 reps    General Comments General comments (skin integrity, edema, etc.): on 4L on arrival with SpO2 low 90s, mid/upper 80s with activity on (pt requested) 6L, recovering to the 88-90% range after  1-2 minutes of rest between standing/exercise sets      Pertinent Vitals/Pain Pain Assessment Pain Assessment: No/denies pain    Home Living                          Prior Function            PT Goals (current goals can now be found in the care plan section) Progress towards PT goals: Progressing toward goals    Frequency    Min 1X/week      PT Plan      Co-evaluation PT/OT/SLP Co-Evaluation/Treatment: Yes Reason for Co-Treatment: To address functional/ADL transfers PT goals addressed during session: Mobility/safety with  mobility OT goals addressed during session: ADL's and self-care      AM-PAC PT "6 Clicks" Mobility   Outcome Measure  Help needed turning from your back to your side while in a flat bed without using bedrails?: A Little Help needed moving from lying on your back to sitting on the side of a flat bed without using bedrails?: A Little Help needed moving to and from a bed to a chair (including a wheelchair)?: A Little Help needed standing up from a chair using your arms (e.g., wheelchair or bedside chair)?: A Little Help needed to walk in hospital room?: A Lot Help needed climbing 3-5 steps with a railing? : A Lot 6 Click Score: 16    End of Session Equipment Utilized During Treatment: Oxygen Activity Tolerance: Patient tolerated treatment well Patient left: in bed;with call bell/phone within reach   PT Visit Diagnosis: Muscle weakness (generalized) (M62.81);Unsteadiness on feet (R26.81)     Time: 3244-0102 PT Time Calculation (min) (ACUTE ONLY): 42 min  Charges:    $Therapeutic Exercise: 8-22 mins $Therapeutic Activity: 8-22 mins PT General Charges $$ ACUTE PT VISIT: 1 Visit                     [name], DPT 06/12/2023, 3:45 PM

## 2023-06-12 NOTE — Progress Notes (Signed)
 Pt refused 500 CBG, we will try again later

## 2023-06-12 NOTE — Plan of Care (Signed)

## 2023-06-12 NOTE — Plan of Care (Signed)
  Problem: Education: Goal: Knowledge of General Education information will improve Description: Including pain rating scale, medication(s)/side effects and non-pharmacologic comfort measures Outcome: Progressing   Problem: Health Behavior/Discharge Planning: Goal: Ability to manage health-related needs will improve Outcome: Not Progressing   Problem: Clinical Measurements: Goal: Ability to maintain clinical measurements within normal limits will improve Outcome: Progressing Goal: Will remain free from infection Outcome: Progressing Goal: Diagnostic test results will improve Outcome: Progressing Goal: Respiratory complications will improve Outcome: Progressing Goal: Cardiovascular complication will be avoided Outcome: Progressing   Problem: Activity: Goal: Risk for activity intolerance will decrease Outcome: Not Progressing   Problem: Nutrition: Goal: Adequate nutrition will be maintained Outcome: Progressing   Problem: Coping: Goal: Level of anxiety will decrease Outcome: Progressing   Problem: Elimination: Goal: Will not experience complications related to bowel motility Outcome: Progressing Goal: Will not experience complications related to urinary retention Outcome: Progressing   Problem: Pain Managment: Goal: General experience of comfort will improve and/or be controlled Outcome: Progressing   Problem: Safety: Goal: Ability to remain free from injury will improve Outcome: Progressing   Problem: Skin Integrity: Goal: Risk for impaired skin integrity will decrease Outcome: Progressing

## 2023-06-12 NOTE — TOC Progression Note (Signed)
 Transition of Care [facility]) - Progression Note    Patient Details  Name: Gary [name] MRN: [id number] Date of Birth: [birth year]  [facility] CM/SW Contact  Chapman Fitch, RN Phone Number: 06/12/2023, 4:18 PM  Clinical Narrative:     Met with patient at beside -Patient states that he feels like his breathing "isnt right" MD notified -He is going to be discharging to [address] [place]. Niece is going to be staying with him.  Patient called a family member named debbie while I was in the room and she confirmed she would be getting the key to the new apartment today - cousin Channing Mutters is going to pick up his O2 from the old apartment tomorrow and take it to the new apartment  - he will need EMS transport -  - he does not want a PCP set up in [place] he said he is already set up with [facility].  [facility] saw patient while he lived in [place],and is schedule to be seen 3/17 by Mingo Amber.  I notified them of the new address   - He is aware he will not have home health - he has a private pay caregiver 6 days a week, 3 hours a day   Expected Discharge Plan: Home/Self Care Barriers to Discharge: Continued Medical Work up  Expected Discharge Plan and Services       Living arrangements for the past 2 months: Apartment                                       Social Determinants of Health (SDOH) Interventions SDOH Screenings   Food Insecurity: No Food Insecurity (06/07/2023)  Housing: Low Risk  (06/07/2023)  Transportation Needs: No Transportation Needs (06/07/2023)  Utilities: Not At Risk (06/07/2023)  Depression (PHQ2-9): Low Risk  (06/07/2021)  Financial Resource Strain: High Risk (11/21/2022)   Received from [facility]  Physical Activity: Insufficiently Active (05/03/2017)  Social Connections: Moderately Isolated (06/07/2023)  Stress: Stress Concern Present (11/20/2022)   Received from [facility]  Tobacco Use: Medium Risk  (06/07/2023)    Readmission Risk Interventions    03/19/2023    9:58 AM 11/05/2022    2:59 PM 11/05/2022   12:16 PM  Readmission Risk Prevention Plan  Transportation Screening Complete Complete Complete  Medication Review Oceanographer) Complete Complete Complete  PCP or Specialist appointment within 3-5 days of discharge Complete Complete Complete  HRI or Home Care Consult Complete Complete Complete  SW Recovery Care/Counseling Consult Complete Complete Complete  Palliative Care Screening Complete Complete Complete  Skilled Nursing Facility Not Applicable Not Applicable Not Applicable

## 2023-06-12 NOTE — Progress Notes (Signed)
 PROGRESS NOTE    JERYL UMHOLTZ  OZH:[id number] DOB: [birth year] DOA: 06/07/2023 PCP: Shayne Alken, MD  [id number]  LOS: 5 days   Brief hospital course:   Assessment & Plan: JAMAREE HOSIER is a 68 y.o. 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 Multiple hospitalizations for same. Patient reported that he has been congested for last 2 weeks with occasional cough of whitish phlegm increasing shortness of breath. Also has had subjective fever no chills.    02/21: in ED, tachypneic, SpO2 89% on 6L, 99% when in creased to 10L. CXR mod-severe pulm vasc congestion. Vanc + cefepime, Lasix 80 mg IV x1. Admitted to hospitalist service. Continued on diuresis, held further abx low procalcitonin.  02/22: continue diuresis and O2 support  02/23: Cr up, will dc IV diuresis given AKI and plan transition back to his po home meds tomorrow. If doing well on po meds anticipate can discharge. He requests to see his pulmonologist tomorrow as well if possible  02/24: alert this morning and doing well, fairly sudden decline early afternoon, somnolent, hypercarbic, CXR appears stable cardiomegaly and pulm vasc congestion. BiPap placed, PCCU eval, transfer to SDU for close monitoring, thankfully improved after some time on BiPap, was declining ventilator. Renal functio better today 02/25: staying alert through today, VSS, participating some w/ therapy, declining to walk. Pt reports he would like to discharge tomorrow if possible as this is when he will have a place to go, moving to [employer], says he will try walking tomorrow. Has been refusing Glc checks.    Acute on chronic hypoxic and hypercapnic  respiratory failure Acute on chronic HFpEF decompensation Echocardiogram was done several times last year will not repeat echo at this time. x-ray findings showing bilateral pulmonary congestion as well as low procalcitonin, low suspicion for recurrent pneumonia.  Received IV lasix 40 mg BID for 5 doses with Cr bump, so pt resumed on home oral diuretics. --previously on 8L O2 via trach collar, now on 3L via [place]. --cont torsemide and aldactone --cont CPAP nightly (has CPAP at home)  Acute COPD exacerbation --pulm consulted Pulmicort, ICS and LABA As needed DuoNebs --cleared for discharge by pulm   Chronic normocytic anemia H&H stable, no signs of active bleeding  chronic colitis --cont mesalamine   AKI on CKD stage IIIa likely d/t diuresis - resolved    Class 3 morbid obesity based on BMI: Body mass index is 47.47 kg/m.  Underweight - under 18  overweight - 25 to 29 obese - 30 or more Class 1 obesity: BMI of 30.0 to 34 Class 2 obesity: BMI of 35.0 to 39 Class 3 obesity: BMI of 40.0 to 49 Super Morbid Obesity: BMI 50-59 Super-super Morbid Obesity: BMI 60+ Significantly low or high BMI is associated with higher medical risk.  Weight management advised as adjunct to other disease management and risk reduction treatments   DVT prophylaxis: Lovenox SQ Code Status: Full code  Family Communication:  Level of care: Telemetry Medical Dispo:   The patient is from: home Anticipated d/c is to: home Anticipated d/c date is: tomorrow   Subjective and Interval History:  Pt reported "breathing is not right" and "feeling weak".  Also said he had diarrhea, but not  noted by RN.  Pulm Dr. Karna Christmas cleared pt for discharge from respiratory stand point.  Pt is due for discharge home, however, he told me, TOC, and nursing directors he could not get into his new [place] home until tomorrow.     Objective: Vitals:   06/12/23 0415 06/12/23 0801 06/12/23 0818 06/12/23 1453  BP: 125/77  (!) 142/82  127/68  Pulse: 78 80 72 87  Resp: 18 18 18 20   Temp: 98.2 F (36.8 C) 98.2 F (36.8 C)  97.9 F (36.6 C)  TempSrc: Axillary     SpO2: 96% 97% 93% 96%  Weight: (!) 185.9 kg     Height:        Intake/Output Summary (Last 24 hours) at 06/12/2023 1909 Last data filed at 06/12/2023 1729 Gross per 24 hour  Intake 480 ml  Output 2550 ml  Net -2070 ml   Filed Weights   06/10/23 0500 06/11/23 0500 06/12/23 0415  Weight: (!) 185.6 kg (!) 186 kg (!) 185.9 kg    Examination:   Constitutional: NAD, AAOx3 HEENT: conjunctivae and lids normal, EOMI CV: No cyanosis.   RESP: normal respiratory effort, trach present, on 3L [place]. Neuro: II - XII grossly intact.     Data Reviewed: I have personally reviewed labs and imaging studies  Time spent: 50 minutes  Darlin Priestly, MD [facility] If 7PM-7AM, please contact night-coverage 06/12/2023, 7:09 PM

## 2023-06-13 ENCOUNTER — Other Ambulatory Visit: Payer: Self-pay

## 2023-06-13 DIAGNOSIS — I5033 Acute on chronic diastolic (congestive) heart failure: Secondary | ICD-10-CM | POA: Diagnosis not present

## 2023-06-13 LAB — GLUCOSE, CAPILLARY: Glucose-Capillary: 95 mg/dL (ref 70–99)

## 2023-06-13 MED ORDER — PANTOPRAZOLE SODIUM 40 MG PO TBEC
40.0000 mg | DELAYED_RELEASE_TABLET | Freq: Every day | ORAL | 2 refills | Status: DC
  Filled 2023-06-13: qty 30, 30d supply, fill #0

## 2023-06-13 MED ORDER — MESALAMINE 1.2 G PO TBEC
1.2000 g | DELAYED_RELEASE_TABLET | Freq: Two times a day (BID) | ORAL | 2 refills | Status: DC
Start: 2023-06-13 — End: 2023-12-25
  Filled 2023-06-13: qty 60, 30d supply, fill #0

## 2023-06-13 MED ORDER — IPRATROPIUM-ALBUTEROL 0.5-2.5 (3) MG/3ML IN SOLN
3.0000 mL | Freq: Four times a day (QID) | RESPIRATORY_TRACT | 2 refills | Status: DC | PRN
  Filled 2023-06-13: qty 90, 8d supply, fill #0

## 2023-06-13 MED ORDER — ESCITALOPRAM OXALATE 10 MG PO TABS
10.0000 mg | ORAL_TABLET | Freq: Every day | ORAL | 2 refills | Status: DC
  Filled 2023-06-13: qty 30, 30d supply, fill #0

## 2023-06-13 MED ORDER — SENNOSIDES-DOCUSATE SODIUM 8.6-50 MG PO TABS: 2.0000 | ORAL_TABLET | Freq: Every evening | ORAL | Status: DC | PRN

## 2023-06-13 MED ORDER — TORSEMIDE 20 MG PO TABS
40.0000 mg | ORAL_TABLET | Freq: Two times a day (BID) | ORAL | 2 refills | Status: DC
  Filled 2023-06-13: qty 120, 30d supply, fill #0

## 2023-06-13 MED ORDER — PANTOPRAZOLE SODIUM 40 MG PO TBEC
40.0000 mg | DELAYED_RELEASE_TABLET | Freq: Every day | ORAL | Status: DC
  Administered 2023-06-13 – 2023-06-14 (×2): 40 mg via ORAL
  Filled 2023-06-13 (×2): qty 1

## 2023-06-13 MED ORDER — ENOXAPARIN SODIUM 100 MG/ML IJ SOSY
0.5000 mg/kg | PREFILLED_SYRINGE | INTRAMUSCULAR | Status: DC
  Administered 2023-06-13 – 2023-06-14 (×2): 90 mg via SUBCUTANEOUS
  Filled 2023-06-13 (×3): qty 1

## 2023-06-13 MED ORDER — SPIRONOLACTONE 25 MG PO TABS
25.0000 mg | ORAL_TABLET | Freq: Every day | ORAL | 2 refills | Status: DC
  Filled 2023-06-13: qty 30, 30d supply, fill #0

## 2023-06-13 MED ORDER — MOMETASONE FURO-FORMOTEROL FUM 100-5 MCG/ACT IN AERO
2.0000 | INHALATION_SPRAY | Freq: Two times a day (BID) | RESPIRATORY_TRACT | 2 refills | Status: DC
  Filled 2023-06-13: qty 13, 30d supply, fill #0

## 2023-06-13 NOTE — Progress Notes (Signed)
 PHARMACIST - PHYSICIAN COMMUNICATION   CONCERNING: IV to Oral Route Change Policy  RECOMMENDATION: This patient is receiving Protonix by the intravenous route.  Based on criteria approved by the Pharmacy and Therapeutics Committee, the intravenous medication(s) is/are being converted to the equivalent oral dose form(s).   DESCRIPTION: These criteria include: The patient is eating (either orally or via tube) and/or has been taking other orally administered medications for a least 24 hours The patient has no evidence of active gastrointestinal bleeding or impaired GI absorption (gastrectomy, short bowel, patient on TNA or NPO).  If you have questions about this conversion, please contact the Pharmacy Department  []   [phone] )  [facility] [x]   [phone] )  [facility] []   [phone] )  [facility] []   [phone] )  [facility] []   [phone] )  [facility]   Bari Mantis PharmD Clinical Pharmacist 06/13/2023

## 2023-06-13 NOTE — Plan of Care (Signed)

## 2023-06-13 NOTE — Discharge Summary (Signed)
 Physician Discharge Summary   STYLES FAMBRO  male DOB: [birth year]  NWG:[id number]  PCP: Shayne Alken, MD  Admit date: 06/07/2023 Discharge date: 06/13/2023.  Pt did not actually leave the hospital until 06/15/23.  Admitted From: home Disposition:  home Essential meds filled at [facility] and provided to pt before discharge. CODE STATUS: Full code  Discharge Instructions     Diet - low sodium heart healthy   Complete by: As directed       Hospital Course:  For full details, please see H&P, progress notes, consult notes and ancillary notes.  Briefly,  Gary [name] is a 68 y.o. 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   Multiple prolonged hospitalizations for same.   02/21: in ED, tachypneic, SpO2 89% on 6L, 99% when in creased to 10L. CXR mod-severe pulm vasc congestion. Vanc + cefepime, Lasix 80 mg IV x1. Admitted to hospitalist service. Continued on diuresis, held further abx low procalcitonin.  02/23: Cr up, dc'ed IV diuresis given AKI and transitioned back to his po home  diuretics. 02/24: alert this morning and doing well, fairly sudden decline early afternoon, somnolent, hypercarbic, CXR appears stable cardiomegaly and pulm vasc congestion. BiPap placed, PCCU eval, transfer to SDU for close monitoring, improved after some time on BiPap. 02/25: staying alert through today, VSS, participating some w/ therapy, declining to walk.    Acute on chronic hypoxic and hypercapnic respiratory failure Acute on chronic HFpEF decompensation Echocardiogram was done several times last year so did not repeat echo at this time. x-ray findings showing bilateral pulmonary congestion as well  as low procalcitonin, low suspicion for recurrent pneumonia.  Received IV lasix 80 mg x1 and IV lasix 40 for 4 doses with Cr bump, so pt resumed on home oral diuretics. --previously on 8L O2 via trach collar, now on 3L via [place].  Trach collar capped.   --cont torsemide and aldactone --cont CPAP nightly (has CPAP at home)   Acute COPD exacerbation --pulm consulted --received Pulmicort nebs, DuoNebs, and Dulera during hospitalization. --cleared for discharge by pulm Dr. Karna Christmas   Chronic normocytic anemia no signs of active bleeding   chronic colitis --cont mesalamine   AKI on CKD stage IIIa likely d/t diuresis - resolved --Cr peaked at 1.52, improved to 1.22 prior to discharge.  Class 3 morbid obesity based on BMI 50.26 --pt eats all his meals but does not move.   Unless noted above, medications under "STOP" list are ones pt was not taking PTA.  Discharge Diagnoses:  Active Problems:   (HFpEF) heart failure with preserved ejection fraction ([name])   CAP (community acquired pneumonia) due to Pneumococcus [facility])   Acute on chronic respiratory failure ([name])   CHF (congestive heart failure) ([name])   30 Day Unplanned Readmission Risk Score    Flowsheet Row ED to Hosp-Admission (Current) from 06/07/2023 in [facility]  30 Day Unplanned Readmission Risk Score (%) 55.9 Filed at 06/13/2023 0801       This score is the patient's risk of an unplanned readmission within 30 days of being discharged (0 -100%). The score is based on dignosis, age, lab data, medications, orders, and past utilization.   Low:  0-14.9   Medium:  15-21.9   High: 22-29.9   Extreme: 30 and above         Discharge Instructions:  Allergies as of 06/13/2023   No Known Allergies      Medication List     STOP taking these medications    acetaminophen 325 MG tablet Commonly known as: TYLENOL   bisacodyl 10 MG suppository Commonly known as: DULCOLAX       TAKE  these medications    amphetamine-dextroamphetamine 30 MG tablet Commonly known as: ADDERALL Take 1 tablet by mouth daily with breakfast.   Clotrimazole Anti-Fungal 1 % cream Generic drug: clotrimazole Apply topically 2 (two) times daily.   escitalopram 10 MG tablet Commonly known as: LEXAPRO Take 1 tablet (10 mg total) by mouth daily.   hydrOXYzine 25 MG tablet Commonly known as: ATARAX Take 1 tablet (25 mg total) by mouth 3 (three) times daily as needed for anxiety.   ipratropium-albuterol 0.5-2.5 (3) MG/3ML Soln Commonly known as: DUONEB Take 3 mLs by nebulization every 6 (six) hours as needed. What changed: Another medication with the same name was removed. Continue taking this medication, and follow the directions you see here.   mesalamine 1.2 g EC tablet Commonly known as: LIALDA Take 1 tablet (1.2 g total) by mouth 2 (two) times daily. What changed: when to take this   mometasone-formoterol 100-5 MCG/ACT Aero Commonly known as: DULERA Inhale 2 puffs into the lungs 2 (two) times daily.   oxyCODONE 5 MG immediate release tablet Commonly known as: Oxy IR/ROXICODONE Take 1 tablet (5 mg total) by mouth 2 (two) times daily as needed for moderate pain (pain score 4-6) or severe pain (pain score 7-10).   pantoprazole 40 MG tablet Commonly known as: PROTONIX Take 1 tablet (40 mg total) by mouth at bedtime. What changed:  when to take this additional instructions   polyvinyl alcohol 1.4 % ophthalmic solution Commonly known as: LIQUIFILM TEARS Place 1 drop into both eyes as needed for dry eyes.   senna-docusate 8.6-50 MG tablet Commonly known as: Senokot-S Take 2 tablets by mouth at bedtime as needed for mild constipation. Home med. What changed:  when to take this reasons to take this additional instructions   spironolactone 25 MG tablet Commonly known as: ALDACTONE Take 1 tablet (25 mg total) by mouth daily.   torsemide 20 MG tablet Commonly known as:  DEMADEX Take 2 tablets (40 mg total) by mouth 2 (two) times daily.   traZODone 50 MG tablet Commonly known as: DESYREL Take 1 tablet (50 mg total) by mouth at bedtime as needed for sleep.   Zepbound 2.5 MG/0.5ML injection vial Generic drug: tirzepatide Inject 0.5 mLs into the skin once a week.         Follow-up Information     Vida Rigger, MD Follow up in 1 week(s).   Specialty: Pulmonary Disease Contact information: [address] [place] [address] [phone]                 No Known Allergies   The results of significant diagnostics from this hospitalization (including imaging, microbiology, ancillary and laboratory) are listed below for reference.   Consultations:   Procedures/Studies: DG Chest Port 1 View Result Date: 06/10/2023 CLINICAL DATA:  Short of breath EXAM: PORTABLE CHEST 1 VIEW COMPARISON:  06/08/2023 FINDINGS: Single frontal view of the chest demonstrates tracheostomy tube overlying tracheal air column, tip 4.5 cm above carina. Cardiac silhouette is enlarged. Continued ectasia of the thoracic aorta. Low lung volumes,  with vascular crowding. Bibasilar veiling opacities are unchanged. No pneumothorax. No acute bony abnormalities. IMPRESSION: 1. Vascular congestion, with stable bibasilar consolidation and effusions. Findings may reflect congestive heart failure given pronounced enlargement of the cardiac silhouette. No change since prior study. Electronically Signed   By: Sharlet Salina M.D.   On: 06/10/2023 15:13   DG Chest 1 View Result Date: 06/08/2023 CLINICAL DATA:  Congestive heart failure, shortness of breath. EXAM: CHEST  1 VIEW COMPARISON:  June 07, 2023. FINDINGS: Stable cardiomegaly. Probable bilateral pulmonary edema is noted with associated bibasilar atelectasis and small pleural effusions. Tracheostomy tube is unchanged. Bony thorax is unremarkable. IMPRESSION: Stable cardiomegaly with probable bilateral pulmonary edema and  associated bibasilar atelectasis and small pleural effusions. Electronically Signed   By: Lupita Raider M.D.   On: 06/08/2023 08:00   DG Chest 2 View Result Date: 06/07/2023 CLINICAL DATA:  Shortness of breath. EXAM: CHEST - 2 VIEW COMPARISON:  05/14/2023. FINDINGS: Low lung volume. There is diffuse moderate pulmonary vascular congestion/edema. Bilateral lung fields are clear. Bilateral costophrenic angles are clear. Stable moderately enlarged cardio-mediastinal silhouette. No acute osseous abnormalities. The soft tissues are within normal limits. Endotracheal tube is seen with its tip approximately 3.7 cm above the carina. IMPRESSION: 1. Moderate pulmonary vascular congestion/edema. 2. Endotracheal tube is seen with its tip approximately 3.7 cm above the carina. Electronically Signed   By: Jules Schick M.D.   On: 06/07/2023 11:46   CT Angio Chest PE W/Cm &/Or Wo Cm Result Date: 05/14/2023 CLINICAL DATA:  Pulmonary embolism suspected.  High probability EXAM: CT ANGIOGRAPHY CHEST WITH CONTRAST TECHNIQUE: Multidetector CT imaging of the chest was performed using the standard protocol during bolus administration of intravenous contrast. Multiplanar CT image reconstructions and MIPs were obtained to evaluate the vascular anatomy. RADIATION DOSE REDUCTION: This exam was performed according to the departmental dose-optimization program which includes automated exposure control, adjustment of the mA and/or kV according to patient size and/or use of iterative reconstruction technique. CONTRAST:  75mL OMNIPAQUE IOHEXOL 350 MG/ML SOLN COMPARISON:  None Available. FINDINGS: Cardiovascular: Images degraded by body motion and body habitus. With this caveat, no filling defects within pulmonary arteries to suggest acute pulmonary embolism. Segmental pulmonary arteries are poorly evaluated. Mediastinum/Nodes: No axillary or supraclavicular adenopathy. No mediastinal or hilar adenopathy. No pericardial fluid. Esophagus  normal. Tracheostomy tube in good position. Lungs/Pleura: No pulmonary infarction. No pneumonia. No pleural fluid. No pneumothorax Bibasilar atelectasis. Upper Abdomen: Limited view of the liver, kidneys, pancreas are unremarkable. Normal adrenal glands. Musculoskeletal: Healed posterior LEFT rib fractures. No acute findings Review of the MIP images confirms the above findings. IMPRESSION: 1. No evidence acute pulmonary embolism. Suboptimal exam due to body habitus and patient motion. 2. Bibasilar atelectasis.  No pulmonary infarction or pneumonia. Electronically Signed   By: Genevive Bi M.D.   On: 05/14/2023 10:32      Labs: BNP (last 3 results) Recent Labs    11/28/22 1448 05/14/23 0129 06/07/23 1224  BNP 27.1 36.2 113.8*   Basic Metabolic Panel: Recent Labs  Lab 06/07/23 1224 06/08/23 0625 06/09/23 0614 06/10/23 0606 06/11/23 0456  NA 142 142 140 138 140  K 3.9 4.2 4.2 3.7 3.5  CL 97* 99 96* 98 95*  CO2 38* 37* 37* 35* 35*  GLUCOSE 90 108* 101* 94 82  BUN 18 25* 34* 30* 25*  CREATININE 1.10 1.25* 1.52* 1.26* 1.22  CALCIUM 8.3* 8.4* 8.4* 8.4* 8.5*  MG  --   --   --   --  2.4  PHOS  --   --   --   --  2.8   Liver Function Tests: No results for input(s): "AST", "ALT", "ALKPHOS", "BILITOT", "PROT", "ALBUMIN" in the last 168 hours. No results for input(s): "LIPASE", "AMYLASE" in the last 168 hours. No results for input(s): "AMMONIA" in the last 168 hours. CBC: Recent Labs  Lab 06/07/23 1224 06/09/23 0614 06/11/23 0456  WBC 5.9 4.6 5.3  HGB 8.2* 8.5* 7.8*  HCT 33.4* 33.7* 30.7*  MCV 79.5* 76.6* 75.1*  PLT 141* 139* 146*   Cardiac Enzymes: No results for input(s): "CKTOTAL", "CKMB", "CKMBINDEX", "TROPONINI" in the last 168 hours. BNP: Invalid input(s): "POCBNP" CBG: Recent Labs  Lab 06/10/23 2041 06/10/23 2334 06/11/23 0351 06/11/23 0721 06/13/23 0421  GLUCAP 87 73 75 85 95   D-Dimer No results for input(s): "DDIMER" in the last 72 hours. Hgb A1c No  results for input(s): "HGBA1C" in the last 72 hours. Lipid Profile No results for input(s): "CHOL", "HDL", "LDLCALC", "TRIG", "CHOLHDL", "LDLDIRECT" in the last 72 hours. Thyroid function studies No results for input(s): "TSH", "T4TOTAL", "T3FREE", "THYROIDAB" in the last 72 hours.  Invalid input(s): "FREET3" Anemia work up No results for input(s): "VITAMINB12", "FOLATE", "FERRITIN", "TIBC", "IRON", "RETICCTPCT" in the last 72 hours. Urinalysis    Component Value Date/Time   COLORURINE YELLOW 11/07/2022 2151   APPEARANCEUR CLEAR 11/07/2022 2151   APPEARANCEUR Clear 04/15/2014 1240   LABSPEC 1.013 11/07/2022 2151   LABSPEC 1.013 04/15/2014 1240   PHURINE 5.0 11/07/2022 2151   GLUCOSEU NEGATIVE 11/07/2022 2151   GLUCOSEU Negative 04/15/2014 1240   HGBUR NEGATIVE 11/07/2022 2151   BILIRUBINUR NEGATIVE 11/07/2022 2151   BILIRUBINUR Negative 04/15/2014 1240   KETONESUR NEGATIVE 11/07/2022 2151   PROTEINUR NEGATIVE 11/07/2022 2151   NITRITE NEGATIVE 11/07/2022 2151   LEUKOCYTESUR NEGATIVE 11/07/2022 2151   LEUKOCYTESUR Negative 04/15/2014 1240   Sepsis Labs Recent Labs  Lab 06/07/23 1224 06/09/23 0614 06/11/23 0456  WBC 5.9 4.6 5.3   Microbiology Recent Results (from the past 240 hours)  Resp panel by RT-PCR (RSV, Flu A&B, Covid) Anterior Nasal Swab     Status: None   Collection Time: 06/07/23  1:21 PM   Specimen: Anterior Nasal Swab  Result Value Ref Range Status   SARS Coronavirus 2 by RT PCR NEGATIVE NEGATIVE Final    Comment: (NOTE) SARS-CoV-2 target nucleic acids are NOT DETECTED.  The SARS-CoV-2 RNA is generally detectable in upper respiratory specimens during the acute phase of infection. The lowest concentration of SARS-CoV-2 viral copies this assay can detect is 138 copies/mL. A negative result does not preclude SARS-Cov-2 infection and should not be used as the sole basis for treatment or other patient management decisions. A negative result may occur with   improper specimen collection/handling, submission of specimen other than nasopharyngeal swab, presence of viral mutation(s) within the areas targeted by this assay, and inadequate number of viral copies(<138 copies/mL). A negative result must be combined with clinical observations, patient history, and epidemiological information. The expected result is Negative.  Fact Sheet for Patients:  BloggerCourse.com  Fact Sheet for Healthcare Providers:  SeriousBroker.it  This test is no t yet approved or cleared by the [place] FDA and  has been authorized for detection and/or diagnosis of SARS-CoV-2 by FDA under an Emergency Use Authorization (EUA). This EUA will remain  in effect (meaning this test can be used) for the duration of the COVID-19 declaration under Section 564(b)(1) of the Act, 21 U.S.C.section 360bbb-3(b)(1), unless the  authorization is terminated  or revoked sooner.       Influenza A by PCR NEGATIVE NEGATIVE Final   Influenza B by PCR NEGATIVE NEGATIVE Final    Comment: (NOTE) The Xpert Xpress SARS-CoV-2/FLU/RSV plus assay is intended as an aid in the diagnosis of influenza from Nasopharyngeal swab specimens and should not be used as a sole basis for treatment. Nasal washings and aspirates are unacceptable for Xpert Xpress SARS-CoV-2/FLU/RSV testing.  Fact Sheet for Patients: BloggerCourse.com  Fact Sheet for Healthcare Providers: SeriousBroker.it  This test is not yet approved or cleared by the [place] FDA and has been authorized for detection and/or diagnosis of SARS-CoV-2 by FDA under an Emergency Use Authorization (EUA). This EUA will remain in effect (meaning this test can be used) for the duration of the COVID-19 declaration under Section 564(b)(1) of the Act, 21 U.S.C. section 360bbb-3(b)(1), unless the authorization is terminated or revoked.      Resp Syncytial Virus by PCR NEGATIVE NEGATIVE Final    Comment: (NOTE) Fact Sheet for Patients: BloggerCourse.com  Fact Sheet for Healthcare Providers: SeriousBroker.it  This test is not yet approved or cleared by the [place] FDA and has been authorized for detection and/or diagnosis of SARS-CoV-2 by FDA under an Emergency Use Authorization (EUA). This EUA will remain in effect (meaning this test can be used) for the duration of the COVID-19 declaration under Section 564(b)(1) of the Act, 21 U.S.C. section 360bbb-3(b)(1), unless the authorization is terminated or revoked.  Performed at [facility], [address] [place], [place] [address]      Total time spend on discharging this patient, including the last patient exam, discussing the hospital stay, instructions for ongoing care as it relates to all pertinent caregivers, as well as preparing the medical discharge records, prescriptions, and/or referrals as applicable, is 40 minutes.    Darlin Priestly, MD  [facility] 06/13/2023, 8:22 AM

## 2023-06-13 NOTE — TOC Progression Note (Signed)
 Transition of Care [facility]) - Progression Note    Patient Details  Name: Gary [name] MRN: [id number] Date of Birth: 1955-11-01  Transition of Care [facility]) CM/SW Contact  Chapman Fitch, RN Phone Number: 06/13/2023, 9:35 AM  Clinical Narrative:     - Per [place] with [facility] they will accept patient since it has been confirmed that patient has been seen by [facility] and has follow up scheduled - Jon with [employer] confirmed that hospital bed to be delivered to the [place] address today.  Referral made for nebulizer to be delivered as well.  Per Patient his niece will be available until 3 for DME to be delivered.  Jon with [employer] notified - Patient states that his family plans to move all of his items including his O2 from [facility] to [place] tomorrow at [employer]. - patient to appeal discharge Expected Discharge Plan: Home/Self Care Barriers to Discharge: Continued Medical Work up  Expected Discharge Plan and Services       Living arrangements for the past 2 months: Apartment Expected Discharge Date: 06/13/23                                     Social Determinants of Health (SDOH) Interventions SDOH Screenings   Food Insecurity: No Food Insecurity (06/07/2023)  Housing: Low Risk  (06/07/2023)  Transportation Needs: No Transportation Needs (06/07/2023)  Utilities: Not At Risk (06/07/2023)  Depression (PHQ2-9): Low Risk  (06/07/2021)  Financial Resource Strain: High Risk (11/21/2022)   Received from [facility]  Physical Activity: Insufficiently Active (05/03/2017)  Social Connections: Moderately Isolated (06/07/2023)  Stress: Stress Concern Present (11/20/2022)   Received from [facility]  Tobacco Use: Medium Risk (06/07/2023)    Readmission Risk Interventions    03/19/2023    9:58 AM 11/05/2022    2:59 PM 11/05/2022   12:16 PM  Readmission Risk Prevention Plan  Transportation Screening Complete Complete Complete  Medication Review Oceanographer) Complete  Complete Complete  PCP or Specialist appointment within 3-5 days of discharge Complete Complete Complete  HRI or Home Care Consult Complete Complete Complete  SW Recovery Care/Counseling Consult Complete Complete Complete  Palliative Care Screening Complete Complete Complete  Skilled Nursing Facility Not Applicable Not Applicable Not Applicable

## 2023-06-13 NOTE — Progress Notes (Signed)
 OT Cancellation Note  Patient Details Name: Gary [name] MRN: [id number] DOB: 1955/09/07   Cancelled Treatment:    Reason Eval/Treat Not Completed: Patient declined, no reason specified. Chart reviewed, on arrival pt declines OOB or ADL attempts citing weakness/fatigue. Green theraband provided per pt request. Will continue to follow POC as able.   Kathie Dike, M.S. OTR/L  06/13/23, 1:57 PM  ascom [phone]

## 2023-06-13 NOTE — Consult Note (Signed)
 [facility] Liaison Consult Note   06/13/2023  KAULIN CHAVES 08/30/1955 [id number]  *Remote coverage for Elliot Cousin, RN, [facility] Liaison [HL]  Insurance: [employer]  Primary Care Provider: Shayne Alken, MD, is listed and this is not currently a VBCI provider  Patient is noted as active with [facility] which makes home visits per inpatient [facility] RN notes.   RN Hospital Liaison screened the patient remotely at [facility].  *Spoke with patient via hospital bedside phone, operator- assisted call, HIPAA verified. Patient confirms he is active with [facility] as Alcide Clever, MD.  He states he will be moving to [facility] on tomorrow.  Explained reason for call to ascertain PCP and confirmed it is [facility].    The patient was screened due to less than 30 day readmission hospitalization with noted extreme high risk score for unplanned readmission risk 3 hospital admissions in 6 months.  The patient was assessed for potential [facility] Coordination service needs for post hospital transition for care coordination.   Plan: [facility] Liaison will sign off as patient's provider does home visits and is not currently affiliated with VBCI.   For questions contact:   Charlesetta Shanks, RN, BSN, CCM [place]  [facility], [facility] Liaison Direct Dial: [phone] or secure chat Email: [email or web address]

## 2023-06-14 LAB — GLUCOSE, CAPILLARY: Glucose-Capillary: 103 mg/dL — ABNORMAL HIGH (ref 70–99)

## 2023-06-14 NOTE — Progress Notes (Signed)
 OT Cancellation Note  Patient Details Name: Gary [name] MRN: [id number] DOB: 10-05-1955   Cancelled Treatment:    Reason Eval/Treat Not Completed: Patient declined, no reason specified. Chart reviewed, on arrival pt completing bed level toileting with staff. Offered mobility upon completion however pt deferred.   Kathie Dike, M.S. OTR/L  06/14/23, 2:35 PM  ascom [phone]

## 2023-06-14 NOTE — TOC Progression Note (Signed)
 Transition of Care [facility]) - Progression Note    Patient Details  Name: Gary [name] MRN: [id number] Date of Birth: 1955-12-20  Transition of Care [facility]) CM/SW Contact  Chapman Fitch, RN Phone Number: 06/14/2023, 11:41 AM  Clinical Narrative:     Delanna Ahmadi will in patient's room. They confirmed patient has pending appeal.  ID # [id number] signed.  Nitchia with TOC notified  Family brought patients O2 concentrator to the hospital room.  I let him know that EMS will not take the concentrator and family will need to come pick it up and take to the apartment in [place]  Patient arranged for a hospital bed to be delivered to his new apartment, he states that the wrong bed was delivered and they are going to switch it out tomorrow.  Jon with [employer] notified   Expected Discharge Plan: Home/Self Care Barriers to Discharge: Continued Medical Work up  Expected Discharge Plan and Services       Living arrangements for the past 2 months: Apartment Expected Discharge Date: 06/13/23                                     Social Determinants of Health (SDOH) Interventions SDOH Screenings   Food Insecurity: No Food Insecurity (06/07/2023)  Housing: Low Risk  (06/07/2023)  Transportation Needs: No Transportation Needs (06/07/2023)  Utilities: Not At Risk (06/07/2023)  Depression (PHQ2-9): Low Risk  (06/07/2021)  Financial Resource Strain: High Risk (11/21/2022)   Received from [facility]  Physical Activity: Insufficiently Active (05/03/2017)  Social Connections: Moderately Isolated (06/07/2023)  Stress: Stress Concern Present (11/20/2022)   Received from [facility]  Tobacco Use: Medium Risk (06/07/2023)    Readmission Risk Interventions    03/19/2023    9:58 AM 11/05/2022    2:59 PM 11/05/2022   12:16 PM  Readmission Risk Prevention Plan  Transportation Screening Complete Complete Complete  Medication Review Oceanographer) Complete Complete  Complete  PCP or Specialist appointment within 3-5 days of discharge Complete Complete Complete  HRI or Home Care Consult Complete Complete Complete  SW Recovery Care/Counseling Consult Complete Complete Complete  Palliative Care Screening Complete Complete Complete  Skilled Nursing Facility Not Applicable Not Applicable Not Applicable

## 2023-06-14 NOTE — Plan of Care (Signed)

## 2023-06-15 NOTE — Plan of Care (Signed)
  Problem: Education: Goal: Knowledge of General Education information will improve Description: Including pain rating scale, medication(s)/side effects and non-pharmacologic comfort measures 06/15/2023 1602 by Glenford Peers, RN Outcome: Progressing 06/15/2023 1602 by Glenford Peers, RN Outcome: Progressing   Problem: Health Behavior/Discharge Planning: Goal: Ability to manage health-related needs will improve 06/15/2023 1602 by Glenford Peers, RN Outcome: Progressing 06/15/2023 1602 by Glenford Peers, RN Outcome: Progressing   Problem: Clinical Measurements: Goal: Ability to maintain clinical measurements within normal limits will improve 06/15/2023 1602 by Glenford Peers, RN Outcome: Progressing 06/15/2023 1602 by Glenford Peers, RN Outcome: Progressing Goal: Will remain free from infection 06/15/2023 1602 by Glenford Peers, RN Outcome: Progressing 06/15/2023 1602 by Glenford Peers, RN Outcome: Progressing Goal: Diagnostic test results will improve 06/15/2023 1602 by Glenford Peers, RN Outcome: Progressing 06/15/2023 1602 by Glenford Peers, RN Outcome: Progressing Goal: Respiratory complications will improve 06/15/2023 1602 by Glenford Peers, RN Outcome: Progressing 06/15/2023 1602 by Glenford Peers, RN Outcome: Progressing Goal: Cardiovascular complication will be avoided 06/15/2023 1602 by Glenford Peers, RN Outcome: Progressing 06/15/2023 1602 by Glenford Peers, RN Outcome: Progressing

## 2023-06-15 NOTE — Progress Notes (Signed)
 Pt seen.  Vitals reviewed.  Pt is still not ready to go home today.

## 2023-06-15 NOTE — TOC Progression Note (Signed)
 Transition of Care [facility]) - Progression Note    Patient Details  Name: ALEXIUS HANGARTNER MRN: [id number] Date of Birth: [birth year]  Transition [facility]) CM/SW Contact  [name], RN Phone Number: 06/15/2023, 10:33 AM  Clinical Narrative:  3/1: Per Herschel [place]: Discharge appeal: Upheld discharge ID# [id number]. 1034 am; noted by RN CM as first number given was incorrect by one number. EMS forms printed to Unit printer [facility]. Contacted [facility] to check on bed swap out and remains in progress per Ada at [facility]. Informed of pending discharge today. Informed provider and Unit RN of status. Unable to reach patient or leave VM. Other contact information given to [employer] to try to reach regarding bed delivery.    Gabriel Cirri MSN RN CM  RN Case Manager [place]  Transitions of Care Direct Dial: [phone] (Weekends Only) [facility] Main Office Phone: [phone] [facility] Fax: [phone] .com     Expected Discharge Plan: Home/Self Care Barriers to Discharge: Continued Medical Work up  Expected Discharge Plan and Services       Living arrangements for the past 2 months: Apartment Expected Discharge Date: 06/13/23                                     Social Determinants of Health (SDOH) Interventions SDOH Screenings   Food Insecurity: No Food Insecurity (06/07/2023)  Housing: Low Risk  (06/07/2023)  Transportation Needs: No Transportation Needs (06/07/2023)  Utilities: Not At Risk (06/07/2023)  Depression (PHQ2-9): Low Risk  (06/07/2021)  Financial Resource Strain: High Risk (11/21/2022)   Received from [facility]  Physical Activity: Insufficiently Active (05/03/2017)  Social Connections: Moderately Isolated (06/07/2023)  Stress: Stress Concern Present (11/20/2022)   Received from [facility]  Tobacco Use: Medium Risk (06/07/2023)    Readmission Risk Interventions: (Historical)    03/19/2023    9:58 AM 11/05/2022    2:59 PM 11/05/2022   12:16 PM  Readmission  Risk Prevention Plan  Transportation Screening Complete Complete Complete  Medication Review Oceanographer) Complete Complete Complete  PCP or Specialist appointment within 3-5 days of discharge Complete Complete Complete  HRI or Home Care Consult Complete Complete Complete  SW Recovery Care/Counseling Consult Complete Complete Complete  Palliative Care Screening Complete Complete Complete  Skilled Nursing Facility Not Applicable Not Applicable Not Applicable

## 2023-06-15 NOTE — TOC Transition Note (Addendum)
 Transition of Care [facility]) - Discharge Note   Patient Details  Name: Gary [name] MRN: [id number] Date of Birth: January 31, 1956  Transition of Care [facility]) CM/SW Contact:  [name], RN Phone Number: 06/15/2023, 4:09 PM   Clinical Narrative:  3/1 Called  [facility] for transport to home and realized patient is going to new home address in [place] which [facility] does not cross county line on weekends. Called back to ask about [facility] which [facility] has been using for non-emergency transport and they are going to see what they can do.  UPDATE:  [facility] was contacted  an agreed to transport to home address as [facility] nor [facility] are not able to transport to [place]. 435 pm.   Gabriel Cirri MSN RN CM  RN Case Manager [place]  Transitions of Care Direct Dial: [phone] (Weekends Only) [facility] Main Office Phone: [phone] [facility] Fax: [phone] [email or web address]       Barriers to Discharge: Barriers Resolved (Pending transportation across [employer] [facility] [employer] per [facility].)   Patient Goals and CMS Choice            Discharge Placement                       Discharge Plan and Services Additional resources added to the After Visit Summary for                  DME Arranged: N/A DME Agency: NA         HH Agency: NA ([facility] out of service area as moving to new address in [place], [place].)        Social Drivers of Health (SDOH) Interventions SDOH Screenings   Food Insecurity: No Food Insecurity (06/07/2023)  Housing: Low Risk  (06/07/2023)  Transportation Needs: No Transportation Needs (06/07/2023)  Utilities: Not At Risk (06/07/2023)  Depression (PHQ2-9): Low Risk  (06/07/2021)  Financial Resource Strain: High Risk (11/21/2022)   Received from [facility]  Physical Activity: Insufficiently Active (05/03/2017)  Social Connections: Moderately Isolated (06/07/2023)  Stress: Stress Concern Present (11/20/2022)   Received from [facility]  Tobacco Use:  Medium Risk (06/07/2023)     Readmission Risk Interventions    03/19/2023    9:58 AM 11/05/2022    2:59 PM 11/05/2022   12:16 PM  Readmission Risk Prevention Plan  Transportation Screening Complete Complete Complete  Medication Review Oceanographer) Complete Complete Complete  PCP or Specialist appointment within 3-5 days of discharge Complete Complete Complete  HRI or Home Care Consult Complete Complete Complete  SW Recovery Care/Counseling Consult Complete Complete Complete  Palliative Care Screening Complete Complete Complete  Skilled Nursing Facility Not Applicable Not Applicable Not Applicable

## 2023-08-22 IMAGING — DX DG CHEST 1V PORT
1 series · 1 of 1 positions shown · non-contrast
Comparison: Radiographs and CT Saturday February, 2020.

CLINICAL DATA: Shortness of breath.  Weakness.

EXAM:
PORTABLE CHEST 1 VIEW

[chest ap]
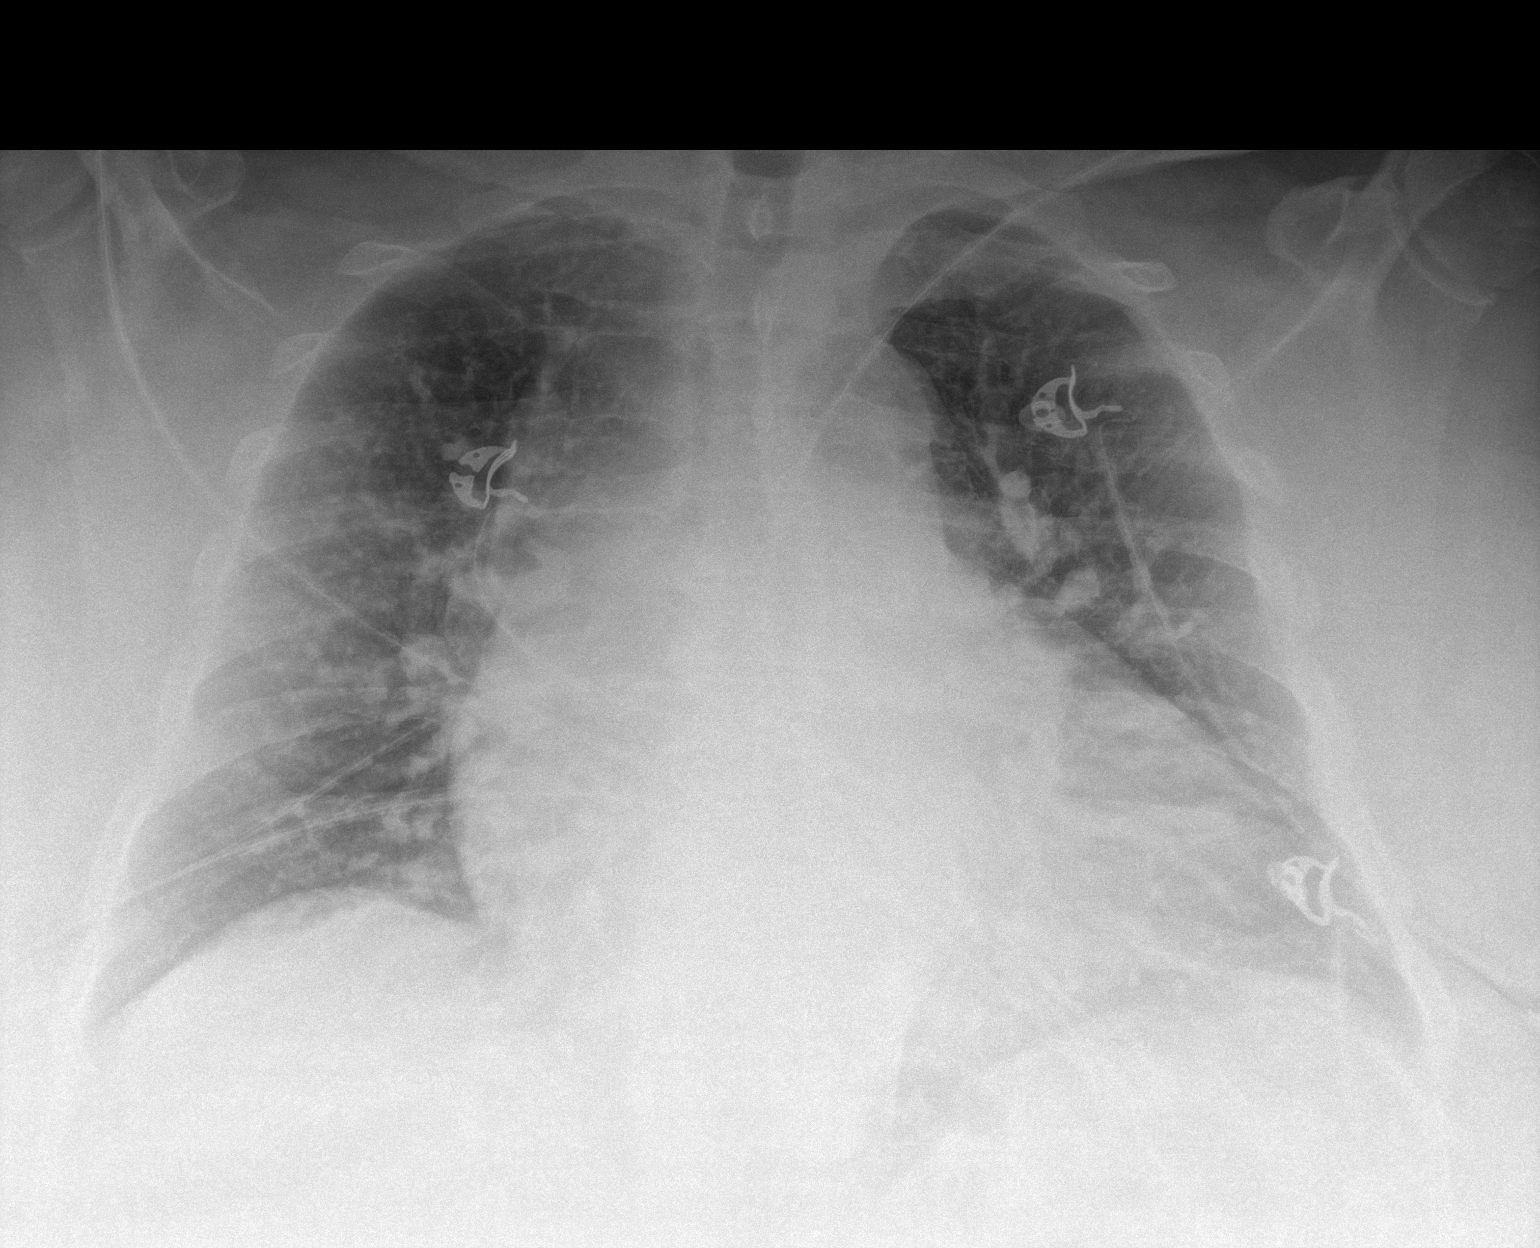

[1 of 1 positions shown; findings below may reference images not displayed]

FINDINGS: Chronic cardiomegaly, unchanged. Mild peribronchial thickening
without focal airspace disease. No pneumothorax or large pleural
effusion. Grossly stable osseous structures
IMPRESSION: 1. Chronic cardiomegaly.
2. Mild peribronchial thickening, may be pulmonary edema or
bronchitis.

## 2023-08-22 IMAGING — DX DG ABDOMEN 1V
1 series · 1 of 1 positions shown · non-contrast
Comparison: None.

CLINICAL DATA: Check gastric catheter placement

EXAM:
ABDOMEN - 1 VIEW

[abdomen supine]
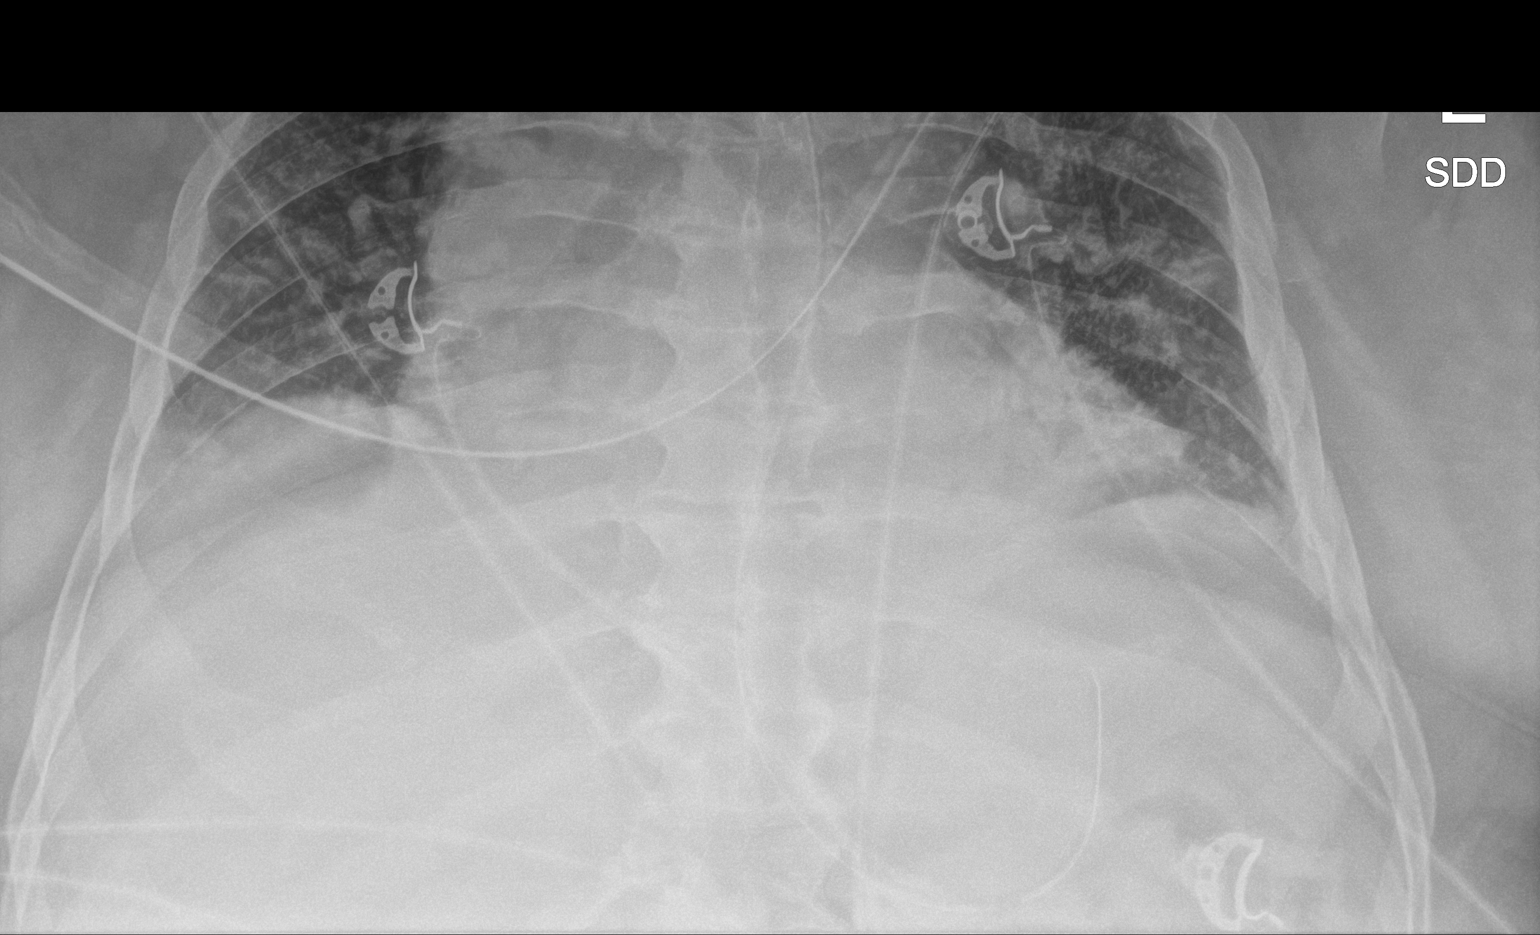

[1 of 1 positions shown; findings below may reference images not displayed]

FINDINGS: Gastric catheter is noted within the stomach.  No free air is noted.
IMPRESSION: Gastric catheter within the stomach.

## 2023-08-22 IMAGING — DX DG CHEST 1V PORT
1 series · 1 of 1 positions shown · non-contrast
Comparison: Film from earlier in the same day.

CLINICAL DATA: Check endotracheal tube placement

EXAM:
PORTABLE CHEST 1 VIEW

[chest ap]
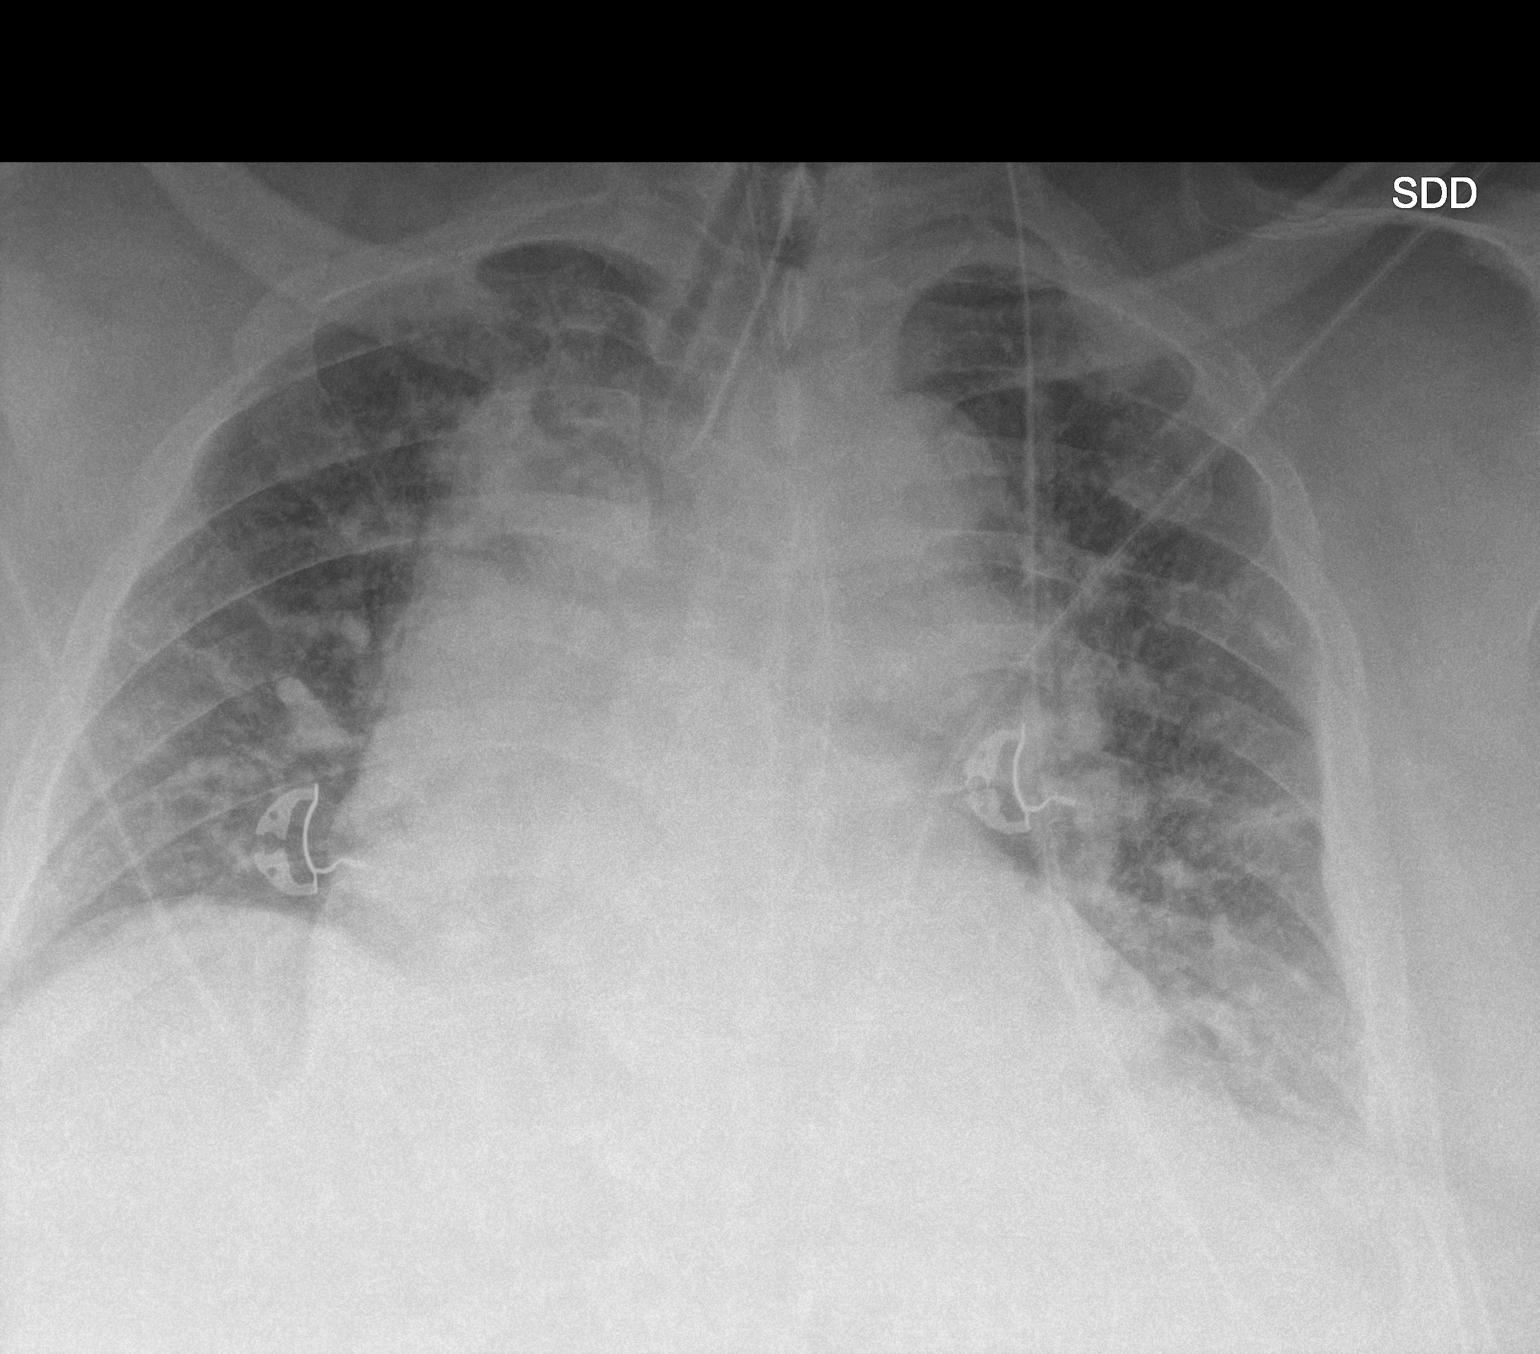

[1 of 1 positions shown; findings below may reference images not displayed]

FINDINGS: Check shadow remains enlarged. Vascular congestion is again
identified. Endotracheal tube is noted 4.3 cm above the carina in
satisfactory position. Gastric catheter extends towards the stomach
although the tip is not well visualized. No bony abnormality is
noted.
IMPRESSION: Tubes and lines as described.

Vascular congestion without edema.

## 2023-09-18 IMAGING — DX DG CHEST 1V PORT
1 series · 1 of 1 positions shown · non-contrast
Comparison: Portable exam 8738 hours compared to 02/22/2021

CLINICAL DATA: Respiratory failure, unconscious, on BiPAP

EXAM:
PORTABLE CHEST 1 VIEW

[chest ap]
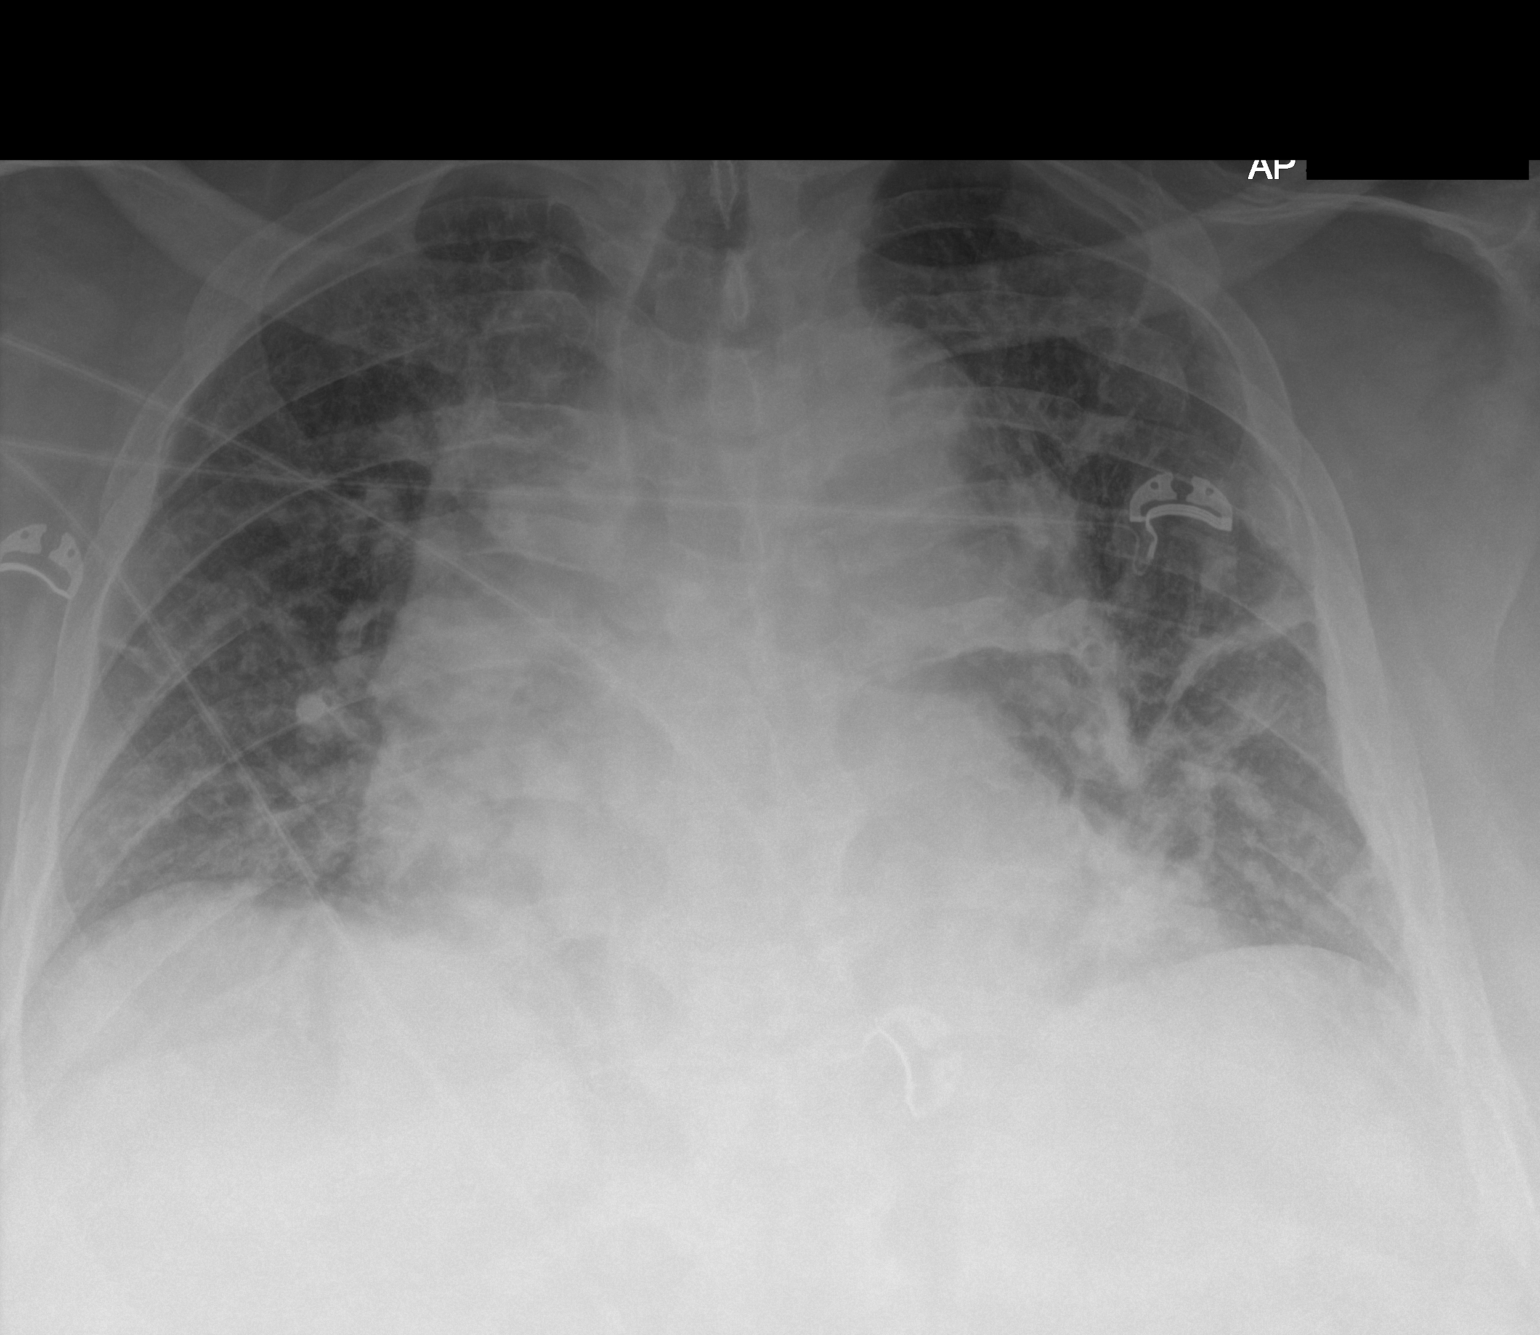

[1 of 1 positions shown; findings below may reference images not displayed]

FINDINGS: Enlargement of cardiac silhouette with pulmonary vascular
congestion.

Subsegmental atelectasis LEFT mid lung.

Additional patchy opacities at the lower lungs could represent
atelectasis or infiltrate.

No pleural effusion or pneumothorax.

Osseous structures unremarkable.
IMPRESSION: Bibasilar opacities question atelectasis versus infiltrate.

Subsegmental atelectasis LEFT mid lung.

Enlargement of cardiac silhouette with pulmonary vascular
congestion.

## 2023-10-11 ENCOUNTER — Emergency Department (HOSPITAL_COMMUNITY)

## 2023-10-11 ENCOUNTER — Encounter (HOSPITAL_COMMUNITY): Payer: Self-pay

## 2023-10-11 ENCOUNTER — Other Ambulatory Visit: Payer: Self-pay

## 2023-10-11 ENCOUNTER — Inpatient Hospital Stay (HOSPITAL_COMMUNITY)
Admission: EM | Admit: 2023-10-11 | Discharge: 2023-10-26 | DRG: 208 | Disposition: A | Discharge status: Other Acute Inpatient Hospital | Attending: Internal Medicine | Admitting: Internal Medicine

## 2023-10-11 DIAGNOSIS — R197 Diarrhea, unspecified: Secondary | ICD-10-CM | POA: Diagnosis present

## 2023-10-11 DIAGNOSIS — J440 Chronic obstructive pulmonary disease with acute lower respiratory infection: Secondary | ICD-10-CM | POA: Diagnosis present

## 2023-10-11 DIAGNOSIS — A4152 Sepsis due to Pseudomonas: Secondary | ICD-10-CM | POA: Diagnosis not present

## 2023-10-11 DIAGNOSIS — Z1152 Encounter for screening for COVID-19: Secondary | ICD-10-CM | POA: Diagnosis not present

## 2023-10-11 DIAGNOSIS — I5032 Chronic diastolic (congestive) heart failure: Secondary | ICD-10-CM | POA: Diagnosis present

## 2023-10-11 DIAGNOSIS — N179 Acute kidney failure, unspecified: Secondary | ICD-10-CM | POA: Diagnosis present

## 2023-10-11 DIAGNOSIS — G928 Other toxic encephalopathy: Secondary | ICD-10-CM | POA: Diagnosis present

## 2023-10-11 DIAGNOSIS — J9622 Acute and chronic respiratory failure with hypercapnia: Secondary | ICD-10-CM | POA: Diagnosis present

## 2023-10-11 DIAGNOSIS — Z930 Tracheostomy status: Secondary | ICD-10-CM

## 2023-10-11 DIAGNOSIS — Z6841 Body Mass Index (BMI) 40.0 and over, adult: Secondary | ICD-10-CM

## 2023-10-11 DIAGNOSIS — Y95 Nosocomial condition: Secondary | ICD-10-CM | POA: Diagnosis present

## 2023-10-11 DIAGNOSIS — N1831 Chronic kidney disease, stage 3a: Secondary | ICD-10-CM | POA: Diagnosis present

## 2023-10-11 DIAGNOSIS — J9601 Acute respiratory failure with hypoxia: Secondary | ICD-10-CM | POA: Diagnosis present

## 2023-10-11 DIAGNOSIS — E874 Mixed disorder of acid-base balance: Secondary | ICD-10-CM | POA: Diagnosis present

## 2023-10-11 DIAGNOSIS — D649 Anemia, unspecified: Secondary | ICD-10-CM | POA: Diagnosis present

## 2023-10-11 DIAGNOSIS — J95851 Ventilator associated pneumonia: Secondary | ICD-10-CM | POA: Diagnosis present

## 2023-10-11 DIAGNOSIS — G9341 Metabolic encephalopathy: Secondary | ICD-10-CM | POA: Diagnosis not present

## 2023-10-11 DIAGNOSIS — J9602 Acute respiratory failure with hypercapnia: Secondary | ICD-10-CM | POA: Diagnosis present

## 2023-10-11 DIAGNOSIS — G4733 Obstructive sleep apnea (adult) (pediatric): Secondary | ICD-10-CM | POA: Diagnosis not present

## 2023-10-11 DIAGNOSIS — Z8711 Personal history of peptic ulcer disease: Secondary | ICD-10-CM

## 2023-10-11 DIAGNOSIS — J449 Chronic obstructive pulmonary disease, unspecified: Secondary | ICD-10-CM | POA: Diagnosis present

## 2023-10-11 DIAGNOSIS — F32A Depression, unspecified: Secondary | ICD-10-CM | POA: Diagnosis present

## 2023-10-11 DIAGNOSIS — J9621 Acute and chronic respiratory failure with hypoxia: Secondary | ICD-10-CM | POA: Diagnosis present

## 2023-10-11 DIAGNOSIS — R6520 Severe sepsis without septic shock: Secondary | ICD-10-CM | POA: Diagnosis not present

## 2023-10-11 DIAGNOSIS — Z87891 Personal history of nicotine dependence: Secondary | ICD-10-CM

## 2023-10-11 DIAGNOSIS — Z8616 Personal history of COVID-19: Secondary | ICD-10-CM

## 2023-10-11 DIAGNOSIS — E662 Morbid (severe) obesity with alveolar hypoventilation: Secondary | ICD-10-CM | POA: Diagnosis present

## 2023-10-11 DIAGNOSIS — Z91199 Patient's noncompliance with other medical treatment and regimen due to unspecified reason: Secondary | ICD-10-CM

## 2023-10-11 DIAGNOSIS — I130 Hypertensive heart and chronic kidney disease with heart failure and stage 1 through stage 4 chronic kidney disease, or unspecified chronic kidney disease: Secondary | ICD-10-CM | POA: Diagnosis present

## 2023-10-11 DIAGNOSIS — K5732 Diverticulitis of large intestine without perforation or abscess without bleeding: Secondary | ICD-10-CM | POA: Diagnosis present

## 2023-10-11 DIAGNOSIS — J189 Pneumonia, unspecified organism: Secondary | ICD-10-CM | POA: Diagnosis not present

## 2023-10-11 DIAGNOSIS — I5030 Unspecified diastolic (congestive) heart failure: Secondary | ICD-10-CM | POA: Diagnosis present

## 2023-10-11 DIAGNOSIS — F410 Panic disorder [episodic paroxysmal anxiety] without agoraphobia: Secondary | ICD-10-CM | POA: Diagnosis not present

## 2023-10-11 DIAGNOSIS — E876 Hypokalemia: Secondary | ICD-10-CM | POA: Diagnosis present

## 2023-10-11 DIAGNOSIS — Y848 Other medical procedures as the cause of abnormal reaction of the patient, or of later complication, without mention of misadventure at the time of the procedure: Secondary | ICD-10-CM | POA: Diagnosis present

## 2023-10-11 DIAGNOSIS — K5792 Diverticulitis of intestine, part unspecified, without perforation or abscess without bleeding: Secondary | ICD-10-CM | POA: Diagnosis present

## 2023-10-11 DIAGNOSIS — J151 Pneumonia due to Pseudomonas: Secondary | ICD-10-CM | POA: Diagnosis present

## 2023-10-11 DIAGNOSIS — R4182 Altered mental status, unspecified: Secondary | ICD-10-CM | POA: Diagnosis not present

## 2023-10-11 DIAGNOSIS — I5022 Chronic systolic (congestive) heart failure: Secondary | ICD-10-CM | POA: Diagnosis not present

## 2023-10-11 DIAGNOSIS — Z7401 Bed confinement status: Secondary | ICD-10-CM | POA: Diagnosis not present

## 2023-10-11 DIAGNOSIS — Z781 Physical restraint status: Secondary | ICD-10-CM | POA: Diagnosis not present

## 2023-10-11 DIAGNOSIS — B965 Pseudomonas (aeruginosa) (mallei) (pseudomallei) as the cause of diseases classified elsewhere: Secondary | ICD-10-CM | POA: Diagnosis not present

## 2023-10-11 DIAGNOSIS — R609 Edema, unspecified: Secondary | ICD-10-CM | POA: Diagnosis not present

## 2023-10-11 DIAGNOSIS — Z79899 Other long term (current) drug therapy: Secondary | ICD-10-CM

## 2023-10-11 DIAGNOSIS — Z833 Family history of diabetes mellitus: Secondary | ICD-10-CM

## 2023-10-11 DIAGNOSIS — Z7951 Long term (current) use of inhaled steroids: Secondary | ICD-10-CM

## 2023-10-11 LAB — URINALYSIS, ROUTINE W REFLEX MICROSCOPIC
Bilirubin Urine: NEGATIVE
Glucose, UA: NEGATIVE mg/dL
Hgb urine dipstick: NEGATIVE
Ketones, ur: NEGATIVE mg/dL
Nitrite: NEGATIVE
Protein, ur: NEGATIVE mg/dL
Specific Gravity, Urine: 1.009 (ref 1.005–1.030)
pH: 5 (ref 5.0–8.0)

## 2023-10-11 LAB — CBC
HCT: 36.6 % — ABNORMAL LOW (ref 39.0–52.0)
Hemoglobin: 9 g/dL — ABNORMAL LOW (ref 13.0–17.0)
MCH: 19.4 pg — ABNORMAL LOW (ref 26.0–34.0)
MCHC: 24.6 g/dL — ABNORMAL LOW (ref 30.0–36.0)
MCV: 79 fL — ABNORMAL LOW (ref 80.0–100.0)
Platelets: 180 10*3/uL (ref 150–400)
RBC: 4.63 MIL/uL (ref 4.22–5.81)
RDW: 19 % — ABNORMAL HIGH (ref 11.5–15.5)
WBC: 7.7 10*3/uL (ref 4.0–10.5)
nRBC: 0.3 % — ABNORMAL HIGH (ref 0.0–0.2)

## 2023-10-11 LAB — RESP PANEL BY RT-PCR (RSV, FLU A&B, COVID)  RVPGX2
Influenza A by PCR: NEGATIVE
Influenza B by PCR: NEGATIVE
Resp Syncytial Virus by PCR: NEGATIVE
SARS Coronavirus 2 by RT PCR: NEGATIVE

## 2023-10-11 LAB — COMPREHENSIVE METABOLIC PANEL WITH GFR
ALT: 9 U/L (ref 0–44)
AST: 12 U/L — ABNORMAL LOW (ref 15–41)
Albumin: 2.8 g/dL — ABNORMAL LOW (ref 3.5–5.0)
Alkaline Phosphatase: 93 U/L (ref 38–126)
Anion gap: 12 (ref 5–15)
BUN: 18 mg/dL (ref 8–23)
CO2: 32 mmol/L (ref 22–32)
Calcium: 8.2 mg/dL — ABNORMAL LOW (ref 8.9–10.3)
Chloride: 96 mmol/L — ABNORMAL LOW (ref 98–111)
Creatinine, Ser: 1.4 mg/dL — ABNORMAL HIGH (ref 0.61–1.24)
GFR, Estimated: 55 mL/min — ABNORMAL LOW (ref >60)
Glucose, Bld: 98 mg/dL (ref 70–99)
Potassium: 4 mmol/L (ref 3.5–5.1)
Sodium: 140 mmol/L (ref 135–145)
Total Bilirubin: 0.7 mg/dL (ref 0.0–1.2)
Total Protein: 7.9 g/dL (ref 6.5–8.1)

## 2023-10-11 MED ORDER — IOHEXOL 350 MG/ML SOLN
75.0000 mL | Freq: Once | INTRAVENOUS | Status: AC | PRN
  Administered 2023-10-11: 100 mL via INTRAVENOUS

## 2023-10-11 MED ORDER — IPRATROPIUM-ALBUTEROL 0.5-2.5 (3) MG/3ML IN SOLN: 3.0000 mL | Freq: Once | RESPIRATORY_TRACT | Status: DC

## 2023-10-11 MED ORDER — AMOXICILLIN-POT CLAVULANATE 875-125 MG PO TABS: 1.0000 | ORAL_TABLET | Freq: Two times a day (BID) | ORAL | 0 refills | Status: DC

## 2023-10-11 NOTE — ED Triage Notes (Signed)
 Pt to ED via [facility] c/o diarrhea x 1 week. Denies N/V. No medications given by EMS  Last VS: 130/76, hr80, 96% @4L ; Baseline,trach .  CBG 103.

## 2023-10-11 NOTE — ED Provider Notes (Signed)
 Patient signed out from the morning provider.  Refer to their note for full details. Patient has history of trach dependence and obesity C/o Diarrhea and cough Pending Ct abd due to mild diffuse tenderness Workup neg then can DC No vomiting   Physical Exam  BP 126/75 (BP Location: Left Arm)   Pulse 77   Temp 98 F (36.7 C) (Oral)   Resp 18   Ht 6' 4 (1.93 m)   Wt (!) 176.9 kg   SpO2 96%   BMI 47.47 kg/m   Physical Exam  Procedures  Procedures  ED Course / MDM    Medical Decision Making Amount and/or Complexity of Data Reviewed Labs: ordered. Radiology: ordered.  Risk Prescription drug management.   CT shows mild uncomplicated sigmoid diverticulitis without evidence of perforation or abscess.  Evidence of cholelithiasis without evidence of cholecystitis.  Labs are not concerning for cholecystitis.  Will discharge patient home with antibiotics.       Clarabell Matsuoka, DO 10/11/23 [phone]

## 2023-10-11 NOTE — Discharge Instructions (Addendum)
 You were found to have mild diverticulitis on your CT scan.  We have prescribed an antibiotic to take over the next week.  Please follow-up with your primary care physician regarding your recent ER visit.  Return to the emergency department if you have any further concerns or questions.  [employer] assistance programs Crisis assistance programs  -[facility]eless[facility] Arts development officer. If you are experiencing homelessness in [place], [place] , your first point of contact should be Pensions consultant. You can reach Coordinated Entry by calling ([phone]) [phone] or by emailing [email or web address].  Community access points: [employer] [phone], [place]) every Tuesday from 9am-10am. [facility] ([address] [place]) every Wednesday from 8am-9am.   -[place] Coordinated Re-entry Daniel Mcal[place]: Dial 211 and request. Offers referrals to homeless shelters in the area.    -The [employer] 938-410-7246) offers several services to local families, as funding allows. The Emergency Assistance Program (EAP), which they administer, provides household goods, free food, clothing, and financial aid to people in need in the [place]  area. The EAP program does have some qualification, and counselors will interview clients for financial assistance by written referral only. Referrals need to be made by the Department of Social Services or by other EAP approved human services agencies or charities in the area.  -Open Door Ministries of [employer], which can be reached at ([phone] [phone], offers emergency assistance programs for those in need of help, such as food, rent assistance, a soup kitchen, shelter, and clothing. They are based in [facility]  but provide a number of services to those that qualify for assistance.   [facility] Department of Social Services may be able to offer temporary  financial assistance and cash grants for paying rent and utilities, Help may be provided for local county residents who may be experiencing personal crisis when other resources, including government programs, are not available. Call [phone]  -High [employer] is a [employer] agency, The organization can offer emergency assistance for paying rent, Caremark Rx, utilities, food, household products and furniture. They offer extensive emergency and transitional housing for families, children and single women, and also run a Boy's and [employer]. Thrift Shops, Secondary school teacher, and other aid offered too. [address] [place], [place]  [address], [phone]  -[place] Low Income Energy Assistance Program -- This is offered for [facility] families. The federal government created [employer] Program provides a one-time cash grant payment to help eligible low-income families pay their electric and heating bills. [address], [place], [place]  [address], [phone]  -[place] Emergency Assistance -- A program offers emergency utility and rent funds for greater [employer] area residents. The program can also provide counseling and referrals to charities and government programs. Also provides food and a free meal program that serves lunch Mondays - Saturdays and dinner seven days per week to individuals in the community. [address] [place], [place]  [address], [phone]  -[place] [employer] - Offers affordable apartment and housing communities across      [place] and [place]. The low income and seniors can access public housing, rental assistance to qualified applicants, and apply for the section 8 rent subsidy program. Other programs include Chiropractor and Engineer, maintenance. [address] [place], [place]  [address], dial [phone].  -The  [facility]ant Center provides transitional housing to veterans and  the disabled. Clients will also access other services too, including assistance in applying for Disability, life skills classes, case management, and assistance in finding permanent housing. [address], [place], [place]  [address], call [phone]  -[place] Transitional Housing through [employer] is for people who were just evicted or that are formerly homeless. The non-profit will also help then gain self-sufficiency, find a home or apartment to live in, and also provides information on rent assistance when needed. Phone [phone]  -The [place] [facility] [facility] helps low income, elderly, or disabled residents in seven counties in the [place] ([place], [place], [place], [place], [place], [place], [place], and [place]) save energy and reduce their utility bills by improving energy efficiency. Phone [phone].  -[employer] is located in the [place] [facility] in the [employer], [address], [place], [place] [address]. Parking is in the rear of the building. Phone: [phone]   General Email: info@gsohc .org  GHC provides free housing counseling assistance in locating affordable rental housing or housing with support services for families and individuals in crisis and the chronically homeless. We provide potential resources for other housing needs like utilities. Our trained counselors also work with clients on budgeting and financial literacy in effort to empower them to take control of their financial situations. [employer] collaborates with homeless service providers and other stakeholders as part of the [employer] COC (Continuum of Care). The (COC) is a regional/local planning body that coordinates housing and services funding for homeless families and individuals. The  role of [facility] in the COC is through housing counseling to work with people we serve on diversion strategies for those that are at imminent risk of becoming homeless. We also work with the Coordinated Assessment/Entry Specialist who attempts to find temporary solutions and/or connects the people to Housing First, Rapid Re-housing or transitional housing programs. Our Homelessness Prevention Housing Counselors meet with clients on business days (Monday-Fridays, except scheduled holidays) from 8:30 am to 4:30 pm.  Legal assistance for evictions, foreclosure, and more -If you need free legal advice on civil issues, such as foreclosures, evictions, Electronics engineer, government programs, domestic issues and more, Armed forces operational officer Aid of [place]  [facility]) is a Associate Professor firm that provides free legal services and counsel to lower income people, seniors, disabled, and others, The goal is to ensure everyone has access to justice and fair representation. Call them at ([phone].  [facility] for Housing and Community Studies can provide info about obtaining legal assistance with evictions. Phone [phone].  Data processing manager  The [employer], [employer]. offers job and Dispensing optician. Resources are focused on helping students obtain the skills and experiences that are necessary to compete in today's challenging and tight job market. The non-profit faith-based community action agency offers internship trainings as well as classroom instruction. Classes are tailored to meet the needs of people in the [facility] region. [place], [place] [address], [phone]  Foreclosure prevention/Debt Services Family Services of the [employer] Credit Counseling Service inludes debt and foreclosure prevention programs for local families. This includes money management, financial advice, budget review and development of a written action plan with a Geneticist, molecular to help solve specific individual financial problems. In addition, housing and mortgage counselors can also provide pre- and post-purchase homeownership counseling, default resolution counseling (to prevent foreclosure) and reverse mortgage counseling. A Debt Management Program allows people and families with a high level  of credit card or medical debt to consolidate and repay consumer debt and loans to creditors and rebuild positive credit ratings and scores. Contact ([phone] F1555895.  Community clinics in [place] -Health Department [facility] Clinic: [address] [place], [place], [address]. [phone].  -Health Department [place] Clinic: [address], [facility], [address]. [phone].  -[facility] offers medical care through a group of doctors, pharmacies and other healthcare related agencies that offer services for low income, uninsured adults in [place]. Also offers adult Dental care and assistance with applying for an [employer]. Call [phone].   [facility]. This center provides low-cost health care to those without health insurance. Services offered include an onsite pharmacy. Phone [phone]. [address], [place].  -Medication Assistance Program serves as a link between pharmaceutical companies and patients to provide low cost or free prescription medications. This service is available for residents who meet certain income restrictions and have no insurance coverage. PLEASE CALL [phone] [place]) OR [phone] ([place])  -One Step Further: Materials engineer, The [employer] Support & Nutrition Program, [employer]. Call ([phone]/ [phone].  Food pantry and assistance -Urban Ministry-Food Bank: [address] [place], [place] [address]. Phone [phone]  -Blessed Table Food Pantry: [address] [place], [place]  [address]. [phone].  -Missionary Ministry: has the purpose of visiting the sick and shut-ins and provide for needs in the surrounding communities. Call [phone]. Email: [email or web address] This program provides: Food box for seniors, Financial assistance, Food to meet basic nutritional needs.  -Meals on Wheels with Senior Resources: [name] residents age 34 and over who are homebound and unable to obtain and prepare a nutritious meal for themselves are eligible for this service. There may be a waiting list in certain parts of [facility] if the route in that area is full. If you are in [facility] and [place] call [phone] to register. For all other areas call [phone] to register.  -Greater Dietitian: https://findfood.BargainContractor.[email or web address]  TRANSPORTATION: -[employer] Department of Health: Call [facility] and [employer] at [phone] for details. AttractionGuides.es  -Access [place]: Access [place] is the [employer] Agency's shared-ride transportation service for eligible riders who have a disability that prevents them from riding the fixed route bus. Call [phone]. Access [place] riders must pay a fare of $1.50 per trip, or may purchase a 10-ride punch card for $14.00 ($1.40 per ride) or a 40-ride punch card for $48.00 ($1.20 per ride).  -The Shepherd's WHEELS rideshare transportation service is provided for senior citizens (60+) who live independently within [place] city limits and are unable to drive or have limited access to transportation. Call [phone] to schedule an appointment.  -[place] Transportation: For Medicare or Medicaid recipients call [phone]?[facility] Ambulance, wheelchair fleeta, and ambulatory quotes available.   FLEEING VIOLENCE: -Family Services of the [place]- 24/7 Crisis line [phone]) -[facility]  Justice Centers: (336) 641-[phone])  [place] 2-1-1 is another useful way to locate resources in the community. Visit ShedSizes.ch to find service information online. If you need additional assistance, 2-1-1 Referral Specialists are available 24 hours a day, every day by dialing 2-1-1 or [phone] from any phone. The call is free, confidential, and available in any language.  Affordable Housing Search http://www.nchousingsearch.[facility])   M-F 8a-3p [address] [place]  [place], [place] [address] [phone] Services include: laundry, barbering,  support groups, case management, phone & computer access, showers, AA/NA mtgs, mental health/substance abuse nurse, job skills class, disability information, VA assistance, spiritual classes, etc. Winter Shelter available when temperatures are less than 32 degrees.   HOMELESS SHELTERS [facility] at [facility]- Call [phone] ext. [phone] or ext. [phone]. Located at [address], [place], [place] [address]  [facility] Mens Shelter- Call [phone]. Located at [address] [place] [address].  [facility]- [employer]. Call [phone]. Office located at [address] [place] [address].  Pathways Family Housing through [place] [phone].  [facility] Family Shelter- Call [phone]. Located at [address] [place], [place], [place] [address].  Room at the Inn-For Pregnant mothers. Call [phone]. Located at [address] [place], [address].  [facility]-For men in [place]. Call [phone]. [facility]-Shelter in [place]. Call [phone].  Home of [employer] for [employer] [phone]. Office located at [address] [place], [address].  [employer] be agreeable to help with chores. Call [phone] ext. [phone].  Men's: [address] [place],  [place] [address]. Women's: [facility]  [address] [place], [place] [address]  Crisis Services Therapeutic Alternatives Mobile Crisis Management- [phone]  [facility] [address], [place], [place] [address]. Phone: [phone] Rent/Utility Assistance in [facility]:  INNOVATIVE PATHWAYS [address], [place], [place] [address] [phone] Mon 8:00am - 6:00pm; Tue 8:00am - 6:00pm; Wed 8:00am - 6:00pm; Thu 8:00am - 6:00pm; Fri 8:00am - 6:00pm; Email: [email or web address] Eligibility: Residents of [place], [place], [place], [place], [place] and [place] that meet income limits. Call or text for eligibility screening.   [facility] MINISTRY [address] [place], [place], [place] [address] [phone] (Main: Rental Assistance) [phone] (Main: Utility Assistance) Mon 8:30am - 5:00pm; Tue 8:30am - 5:00pm; Wed 8:30am - 5:00pm; Thu 8:30am - 5:00pm; Fri 8:30am - 5:00pm; Website: http://www.greensborourbanministry.org/emergency-assistance-program Eligibility: People who have an unexpected crisis or emergency that can be verified. Must have some form of income and meet income limits. At the first of the month, only helps with rent/mortgage assistance for those who have court ordered eviction notices. Call for application information. Call for exact documents that will be needed. Examples of documents that may be needed: Photo ID, Social Security cards for everyone in the household, and proof of income for previous 2 months. Copy of eviction notice for rent assistance and copy of final notice for utility assistance. Statements or receipts of bills for previous 2 months.   [facility] [employer] - [place] [address] [place], [place] [address] [phone] (Main) [phone] (Alternate) Mon 9:00am - 5:00pm; Tue 9:00am - 5:00pm; Wed 9:00am - 5:00pm; Thu 9:00am - 5:00pm; Fri 9:00am - 5:00pm; Website:  http://southernusa.salvationarmy.org/[place]/emergency-financial-assistance Email: [email or web address] Eligibility: People experiencing a housing crisis with past-due rent and/or utilities and meet income limits. Must be willing to take part in 6 Call or visit website to download application. Return complete application by mail or email only. Documents: Help with Utilities: Photo ID, proof of household income, copies of monthly bills or receipts, and a final disconnection/shut-off notice. Help with Rent or Mortgage: Photo ID, proof of income, copies of monthly bills or receipts, and eviction notice. Help with Household Goods: Photo ID, proof of household income, copies of monthly bills or receipts, and a fire or flood report.  [facility] - [place] [address]  [address], [place], [place] [address] [phone] (Main) Mon 8:00am - 5:00pm; Tue 8:00am - 5:00pm; Wed 8:00am - 5:00pm; Thu 8:00am - 5:00pm; Fri 8:00am - 12:00pm; Website: http://southernusa.salvationarmy.org/[place]/emergency-financial-assistance Email: [email or web address] Call for eligibility information. Apply :Utilities Assistance: Visit office by 8:30am on 1st and 4th Monday of each month to pick up application. Rent and Mortgage Assistance: Visit office by 8:30am on 2nd and 3rd Monday of each month to pick up application. NOTE: If Monday falls on a holiday applications can be picked up the following Tuesday. Documents required will be listed on application.  [facility] [place] [phone] (Main) Seen by appointment only. Call for more information. Eligibility: Meet income limits. Apply: Call for information on how to schedule an appointment. Each month there is a specific day to call to schedule an appointment. It is stated on the agency voicemail message. Appointments fill up quickly each month. Documents: Photo ID, copy of current utility bill.  [facility]S [place] [address] [place], [place] [address] [phone] (Main) Tue 9:00am - 4:00pm; Wed 9:00am - 4:00pm; Thu 9:00am - 4:00pm; Website: http://www.[email or web address] Email: [email or web address]@gmail .com Eligibility: Utility Assistance: Meet income limits and be a Holiday representative. [employer] customers do not qualify. Must not have received utility assistance for another agency within the last 90 days. Rent Assistance: Residents of [employer] who meet income limits. Must not have received rent assistance for another agency within the last 90 days. Apply: Call to schedule an appointment. Documents: Utility Assistance: Photo ID, City of [employer], copy of lease (if not paying a mortgage), proof of income, and monthly expenses. Rent Assistance: Photo ID, W-9 from the landlord, copy of the lease, proof of income, and a list of monthly expenses.  [facility] - [place] [address] [place], [place] [address] [phone] (Main: Help With Rent) [phone] (Main: Help With Utilities) Mon 9:00am - 4:00pm; Tue 9:00am - 4:00pm; Wed 9:00am - 4:00pm; Thu 9:00am - 4:00pm; Fri 9:00am - 4:00pm; Website: [email or web address].no Email: [email or web address] Eligibility: People experiencing a financial crisis. Apply: Call to schedule an appointment Wednesday, 7:30am. Documents: Photo ID, Social Security card, proof of income, and proof of address. Other documents may be required, depending on service. Call for more information.  LOW INCOME ENERGY ASSISTANCE PROGRAM DEPARTMENT OF SOCIAL SERVICES - [facility] [address] [place], [place] [address] [phone] (Main) Mon 8:00am - 5:00pm; Tue 8:00am - 5:00pm; Wed 8:00am - 5:00pm; Thu 8:00am - 5:00pm; Fri 8:00am - 5:00pm; Website:  [email or web address] Eligibility: Meet income limits and resource guidelines. Each household is only eligible once, even if multiple members apply. Apply: Call to see if funds are available. Visit to complete an application, call to have 1 mailed, or apply online at [email or web address] NOTE: Households with a person age 47 and over or a person with a documented disability can apply beginning December 1. Other households can apply beginning January 1. Documents: Photo ID, birth certificate, proof of household income, copy of utility bill, latest bank statement, the names and Social Security numbers for everyone in the household, and proof of disability if under age 51.  LOW INCOME ENERGY ASSISTANCE PROGRAM DEPARTMENT OF SOCIAL SERVICES - [facility] [address] [place], [place], [place] [address] [phone] (Main) Mon 8:00am - 5:00pm; Tue 8:00am - 5:00pm; Wed 8:00am - 5:00pm; Thu 8:00am - 5:00pm; Fri 8:00am - 5:00pm; Website: [email or web address] Eligibility: Meet income limits and  resource guidelines. Each household is only eligible once, even if multiple members apply. Apply: Call to see if funds are available. Visit to complete an application, call to have 1 mailed, or apply online at [email or web address] NOTE: Households with a person age 13 and over or a person with a documented disability can apply beginning December 1. Other households can apply beginning January 1. Documents: Photo ID, birth certificate, proof of household income, copy of utility bill, latest bank statement, the names and Social Security numbers for everyone in the household, and proof of disability if under age 82.

## 2023-10-11 NOTE — ED Provider Notes (Signed)
 [place] EMERGENCY DEPARTMENT AT [facility] Provider Note   CSN: [id number] Arrival date & time: 10/11/23  1216     Patient presents with: Diarrhea   Gary [name] is a 68 y.o. male.   68 year old male with past medical history of morbid obesity presenting to the emergency department today with abdominal cramping and diarrhea.  The patient is also been having a cough with this.  He states that this occurs multiple times per day.  He denies any blood in his stool or dark stools that he is aware of.  He has had a cough productive of some phlegm over the past 2 or 3 days as well.  The diarrhea is been going now for a few days longer.  He denies any associated vomiting with this.  He came to the ER today for further evaluation due to these ongoing symptoms.  He has not been on any antibiotics recently that he is aware of.   Diarrhea      Prior to Admission medications   Medication Sig Start Date End Date Taking? Authorizing Provider  amphetamine -dextroamphetamine  (ADDERALL) 30 MG tablet Take 1 tablet by mouth daily with breakfast. 04/22/23   Josette Ade, MD  clotrimazole  (LOTRIMIN ) 1 % cream Apply topically 2 (two) times daily. 04/21/23   Josette Ade, MD  escitalopram  (LEXAPRO ) 10 MG tablet Take 1 tablet (10 mg total) by mouth daily. 06/13/23   Awanda City, MD  hydrOXYzine  (ATARAX ) 25 MG tablet Take 1 tablet (25 mg total) by mouth 3 (three) times daily as needed for anxiety. 04/21/23   Josette Ade, MD  ipratropium-albuterol  (DUONEB) 0.5-2.5 (3) MG/3ML SOLN Take 3 mLs by nebulization every 6 (six) hours as needed. 06/13/23   Awanda City, MD  mesalamine  (LIALDA ) 1.2 g EC tablet Take 1 tablet (1.2 g total) by mouth 2 (two) times daily. 06/13/23   Awanda City, MD  mometasone -formoterol  (DULERA ) 100-5 MCG/ACT AERO Inhale 2 puffs into the lungs 2 (two) times daily. 06/13/23   Awanda City, MD  oxyCODONE  (OXY IR/ROXICODONE ) 5 MG immediate release tablet Take 1 tablet (5 mg total) by  mouth 2 (two) times daily as needed for moderate pain (pain score 4-6) or severe pain (pain score 7-10). 05/20/23   Jens Durand, MD  pantoprazole  (PROTONIX ) 40 MG tablet Take 1 tablet (40 mg total) by mouth at bedtime. 06/13/23   Awanda City, MD  polyvinyl alcohol  (LIQUIFILM TEARS) 1.4 % ophthalmic solution Place 1 drop into both eyes as needed for dry eyes. 04/21/23   Josette Ade, MD  senna-docusate (SENOKOT-S) 8.6-50 MG tablet Take 2 tablets by mouth at bedtime as needed for mild constipation. Home med. 06/13/23   Awanda City, MD  spironolactone  (ALDACTONE ) 25 MG tablet Take 1 tablet (25 mg total) by mouth daily. 06/13/23   Awanda City, MD  torsemide  (DEMADEX ) 20 MG tablet Take 2 tablets (40 mg total) by mouth 2 (two) times daily. 06/13/23   Awanda City, MD  traZODone  (DESYREL ) 50 MG tablet Take 1 tablet (50 mg total) by mouth at bedtime as needed for sleep. 04/21/23   Josette Ade, MD    Allergies: Patient has no known allergies.    Review of Systems  Respiratory:  Positive for cough.   Gastrointestinal:  Positive for diarrhea.  All other systems reviewed and are negative.   Updated Vital Signs BP 126/75 (BP Location: Left Arm)   Pulse 77   Temp 98 F (36.7 C) (Oral)   Resp 18   Ht  6' 4 (1.93 m)   Wt (!) 176.9 kg   SpO2 96%   BMI 47.47 kg/m   Physical Exam Vitals and nursing note reviewed.   Gen: NAD, morbidly obese Eyes: PERRL, EOMI HEENT: no oropharyngeal swelling Neck: trachea midline Resp: clear to auscultation bilaterally although exam difficult due to body habitus Card: RRR, no murmurs, rubs, or gallops Abd: Mild diffuse tenderness with no guarding or rebound, exam difficult due to body habitus Extremities: no calf tenderness, no edema Vascular: 2+ radial pulses bilaterally, 2+ DP pulses bilaterally Skin: no rashes Psyc: acting appropriately   (all labs ordered are listed, but only abnormal results are displayed) Labs Reviewed  COMPREHENSIVE METABOLIC PANEL WITH GFR  - Abnormal; Notable for the following components:      Result Value   Chloride 96 (*)    Creatinine, Ser 1.40 (*)    Calcium  8.2 (*)    Albumin  2.8 (*)    AST 12 (*)    GFR, Estimated 55 (*)    All other components within normal limits  CBC - Abnormal; Notable for the following components:   Hemoglobin 9.0 (*)    HCT 36.6 (*)    MCV 79.0 (*)    MCH 19.4 (*)    MCHC 24.6 (*)    RDW 19.0 (*)    nRBC 0.3 (*)    All other components within normal limits  URINALYSIS, ROUTINE W REFLEX MICROSCOPIC - Abnormal; Notable for the following components:   Leukocytes,Ua TRACE (*)    Bacteria, UA RARE (*)    All other components within normal limits  RESP PANEL BY RT-PCR (RSV, FLU A&B, COVID)  RVPGX2  C DIFFICILE QUICK SCREEN W PCR REFLEX      EKG: None  Radiology: DG Chest Portable 1 View Result Date: 10/11/2023 CLINICAL DATA:  Cough and diarrhea for 1 week. EXAM: PORTABLE CHEST 1 VIEW COMPARISON:  06/10/2023 FINDINGS: Tracheostomy tube remains in place. Low lung volumes noted. Mild-to-moderate cardiomegaly is again seen. Mild parenchymal scarring is again seen in both mid lungs. No evidence of pulmonary consolidation, pneumothorax, or pleural effusion. IMPRESSION: Stable cardiomegaly and mild bilateral pulmonary scarring. No acute findings. Electronically Signed   By: Norleen DELENA Kil M.D.   On: 10/11/2023 13:59     Procedures   Medications Ordered in the ED  ipratropium-albuterol  (DUONEB) 0.5-2.5 (3) MG/3ML nebulizer solution 3 mL (has no administration in time range)  iohexol  (OMNIPAQUE ) 350 MG/ML injection 75 mL (100 mLs Intravenous Contrast Given 10/11/23 1643)                                    Medical Decision Making 68 year old male with past medical history of hypertension, COPD, CHF, chronic kidney disease, and morbid obesity presenting to the emergency department today with concern for diarrhea as well as a productive cough over the past few days.  The patient pulse ox is 92% on  room air.  Will further evaluate with a chest x-ray to evaluate for pneumonia or pulmonary edema as well as basic labs including LFTs and a lipase to evaluate for anemia or electrolyte abnormalities.  Will obtain a CT scan to evaluate for colitis, diverticulitis, or appendicitis.  Will give the patient a DuoNeb here and reevaluate for ultimate disposition.  Given the cough as well as loose stools this may be due to viral syndrome but this would be a diagnosis of exclusion.  The patient's  labs here are reassuring.  Chest x-ray is unremarkable.  CT scan is pending at the time of signout.  Plan is for reassessment after CT.  I think that if this is negative that the patient can likely be discharged as with his URI symptoms and the diarrhea this may be due to viral etiology if urgent or emergent intra-abdominal pathology is ruled out.  Amount and/or Complexity of Data Reviewed Labs: ordered. Radiology: ordered.  Risk Prescription drug management.        Final diagnoses:  Diarrhea, unspecified type    ED Discharge Orders     None          Ula Prentice SAUNDERS, MD 10/11/23 [phone]

## 2023-10-11 NOTE — ED Notes (Signed)
 PTAR called for transportation. Pt dc prior to change of shift, no family member available at this time pt states he lives at home with nice pt is complete care needing continues O2.

## 2023-10-12 LAB — C DIFFICILE QUICK SCREEN W PCR REFLEX
C Diff antigen: NEGATIVE
C Diff interpretation: NOT DETECTED
C Diff toxin: NEGATIVE

## 2023-10-12 MED ORDER — SPIRONOLACTONE 25 MG PO TABS
25.0000 mg | ORAL_TABLET | Freq: Every day | ORAL | Status: DC
  Administered 2023-10-12 – 2023-10-15 (×4): 25 mg via ORAL
  Filled 2023-10-12 (×4): qty 1

## 2023-10-12 MED ORDER — FLUTICASONE FUROATE-VILANTEROL 100-25 MCG/ACT IN AEPB
1.0000 | INHALATION_SPRAY | Freq: Every day | RESPIRATORY_TRACT | Status: DC
  Administered 2023-10-13: 1 via RESPIRATORY_TRACT
  Filled 2023-10-12: qty 28

## 2023-10-12 MED ORDER — OXYCODONE HCL 5 MG PO TABS
5.0000 mg | ORAL_TABLET | Freq: Two times a day (BID) | ORAL | Status: DC | PRN
  Administered 2023-10-16 – 2023-10-18 (×2): 5 mg via ORAL
  Filled 2023-10-12 (×2): qty 1

## 2023-10-12 MED ORDER — SENNOSIDES-DOCUSATE SODIUM 8.6-50 MG PO TABS: 2.0000 | ORAL_TABLET | Freq: Every evening | ORAL | Status: DC | PRN

## 2023-10-12 MED ORDER — HYDROXYZINE HCL 25 MG PO TABS
25.0000 mg | ORAL_TABLET | Freq: Three times a day (TID) | ORAL | Status: DC | PRN
  Administered 2023-10-14 – 2023-10-16 (×2): 25 mg via ORAL
  Filled 2023-10-12 (×2): qty 1

## 2023-10-12 MED ORDER — TRAZODONE HCL 50 MG PO TABS
50.0000 mg | ORAL_TABLET | Freq: Every evening | ORAL | Status: DC | PRN
  Filled 2023-10-12: qty 1

## 2023-10-12 MED ORDER — AMPHETAMINE-DEXTROAMPHETAMINE 10 MG PO TABS
30.0000 mg | ORAL_TABLET | Freq: Every day | ORAL | Status: DC
Start: 2023-10-13 — End: 2023-10-16
  Filled 2023-10-12 (×2): qty 3

## 2023-10-12 MED ORDER — POLYVINYL ALCOHOL 1.4 % OP SOLN: 1.0000 [drp] | OPHTHALMIC | Status: DC | PRN

## 2023-10-12 MED ORDER — AMPHETAMINE-DEXTROAMPHETAMINE 30 MG PO TABS: 30.0000 mg | ORAL_TABLET | Freq: Every day | ORAL | Status: DC

## 2023-10-12 MED ORDER — ESCITALOPRAM OXALATE 10 MG PO TABS
10.0000 mg | ORAL_TABLET | Freq: Every day | ORAL | Status: DC
  Administered 2023-10-12 – 2023-10-17 (×5): 10 mg via ORAL
  Filled 2023-10-12 (×5): qty 1

## 2023-10-12 MED ORDER — TORSEMIDE 20 MG PO TABS
40.0000 mg | ORAL_TABLET | Freq: Two times a day (BID) | ORAL | Status: DC
  Administered 2023-10-12 – 2023-10-15 (×6): 40 mg via ORAL
  Filled 2023-10-12 (×7): qty 2

## 2023-10-12 MED ORDER — MESALAMINE 1.2 G PO TBEC
1.2000 g | DELAYED_RELEASE_TABLET | Freq: Two times a day (BID) | ORAL | Status: DC
  Administered 2023-10-12 – 2023-10-15 (×6): 1.2 g via ORAL
  Filled 2023-10-12 (×10): qty 1

## 2023-10-12 MED ORDER — IPRATROPIUM-ALBUTEROL 0.5-2.5 (3) MG/3ML IN SOLN
3.0000 mL | Freq: Four times a day (QID) | RESPIRATORY_TRACT | Status: DC | PRN
  Administered 2023-10-15 – 2023-10-18 (×4): 3 mL via RESPIRATORY_TRACT
  Filled 2023-10-12 (×4): qty 3

## 2023-10-12 MED ORDER — CLOTRIMAZOLE 1 % EX CREA
TOPICAL_CREAM | Freq: Two times a day (BID) | CUTANEOUS | Status: DC
  Administered 2023-10-18 – 2023-10-26 (×3): 1 via TOPICAL
  Filled 2023-10-12 (×3): qty 15

## 2023-10-12 MED ORDER — PANTOPRAZOLE SODIUM 40 MG PO TBEC
40.0000 mg | DELAYED_RELEASE_TABLET | Freq: Every day | ORAL | Status: DC
  Administered 2023-10-12 – 2023-10-14 (×3): 40 mg via ORAL
  Filled 2023-10-12 (×3): qty 1

## 2023-10-12 NOTE — ED Notes (Signed)
 Patient's sister called stating that the patient's niece cannot take care of him anymore and is refusing to allow him to come back. Sister states she cannot take the patient either. Charge RN aware.   Sister's name is Marval Chancy [phone]

## 2023-10-12 NOTE — ED Provider Notes (Signed)
 Assumed care at 0700.  I was notified by nursing that they were unable to transport the patient home.  He had been awaiting transport to go home and his niece who lives with him had told them that he could not come back.  I had a discussion with the patient.  He would like to go home.  Not in any acute distress now.  He understands he can return anytime.  I do not think legally the niece can deny his return.  Case was discussed with social work who will try to get transport for the patient.  Of note the patient is listed as being on oxygen  though when I was in the room the patient has no oxygen  on and he is having no issues breathing and is not hypoxic.   Emil Share, DO 10/12/23 1059

## 2023-10-12 NOTE — Care Management (Addendum)
 Transition of Care [facility]) - Emergency Department Mini Assessment   Patient Details  Name: Gary [name] MRN: [id number] Date of Birth: March 04, 1956  Transition of Care [facility]) CM/SW Contact:    Corean [name] Canary, RN Phone Number: 10/12/2023, 10:23 AM   Clinical Narrative: Mostly bed bound patient presented with loose stool x 1 week. He has a tracheostomy with oxygen  and passe muir valve  He is alert and oriented met the patient at bedside He nad his sister have been sharing this apartment for 4 months,  has PCS services 8a-12p 7 days a week  sister  and niece care for him. They have stated they can no longer care for him.  He is on the waiting list for the last year at [employer], he plans to speak to his insurance care manager to put in a good word for him to expedite the process  Consult with PD, he is established residence, and has a right to go back home. Discussed with CSW. Will call sister to let her know he will be returning.  Will need PTAR to get home.  He states he has all DME needed ( hospital bed, walker, BSC)  Reached out to Winton Shine, sister Left message to return call to this RNCM 1200 No answer from sister called other sister [name] Chancy left message to call back, called Jenelle Budge, niece no answer  40 Called [name] again, left a message that if we do not hear from her we will be sending out for a well check ,updated the team 1500 have received no calls back from family members, spoke to patient at bedside, he called them from his phone no answer for either of his sisters called patient back to clarify his address after calling non emergency number  it is [address] Gary [name] morita, this was corrected . The patient states his sister may be at work, but she took his wallet yesterday with money in it. ( He stated 350 dollars Non emergency line  called for a well check 1540 PD Officer Fae called back. No- one at residence, Left card in their door to call .  Patient will be boarding at this time.  1720 No return calls from family ED Mini Assessment:     Barriers to Discharge: Family Issues  Barrier interventions: See notes        Patient Contact and Communications        ,                 Admission diagnosis:  diarrhea Patient Active Problem List   Diagnosis Date Noted   CHF (congestive heart failure) ([name]) 06/07/2023   Diarrhea 05/17/2023   Rectal bleeding 05/16/2023   Tracheitis 02/26/2023   Dry skin 01/25/2023   Eyelid cyst, right 01/23/2023   Fluid overload 11/30/2022   Acute on chronic respiratory failure ([name]) 11/02/2022   History of DVT (deep vein thrombosis) 10/27/2022   Acute kidney injury superimposed on CKD ([name]) 10/27/2022   Acute on chronic respiratory failure with hypoxia ([name]) 10/17/2022   Chronic colitis 09/12/2022   Segmental colitis associated with diverticulosis ([name]) 09/12/2022   Diverticulosis of colon with hemorrhage 09/10/2022   Acute blood loss anemia 09/08/2022   Acute GI bleeding 09/07/2022   Chest pain 08/13/2022   Influenza A 04/11/2022   Hypokalemia 03/06/2022   Chronic obstructive pulmonary disease (COPD) ([name]) 02/03/2022   GERD without esophagitis 02/03/2022   Anxiety 02/03/2022   Melena  Major depressive disorder, recurrent episode, moderate ([name]) 10/22/2021   Pressure injury of skin 09/27/2021   COPD (chronic obstructive pulmonary disease) ([name])    Iron  deficiency anemia    Generalized weakness    CAP (community acquired pneumonia) due to Pneumococcus ([name]) 03/11/2021   (HFpEF) heart failure with preserved ejection fraction ([name])    Hyperlipidemia 02/19/2021   Generalized osteoarthritis of multiple sites 10/31/2020   Hyperkalemia 03/04/2020   Marijuana use 01/17/2020   Thrombocytopenia ([name]) 01/17/2020   Positive hepatitis C antibody test 01/17/2020   Osteoarthritis of glenohumeral joints (Bilateral) 01/17/2020   Osteoarthritis of AC (acromioclavicular) joints  (Bilateral) 01/17/2020   Polysubstance abuse ([name]) 08/25/2019   Toxic metabolic encephalopathy 08/25/2019   Chronic pain syndrome 07/14/2019   Anticoagulated 07/14/2019   DDD (degenerative disc disease), lumbosacral 07/14/2019   DDD (degenerative disc disease), cervical 07/14/2019   Acute on chronic respiratory failure with hypoxia and hypercapnia ([name]) 05/28/2018   Acute on chronic diastolic CHF (congestive heart failure) ([name]) 02/28/2015   OSA (obstructive sleep apnea) 02/28/2015   Morbid obesity with BMI of 50.0-59.9, adult ([name]) 04/01/2014   Hypotension 04/01/2014   PCP:  No primary care provider on file. Pharmacy:   [facility] - [place], [place] - [address] [place] [address] Phone: [phone] Fax: [phone]  [facility] [address] [place] [address] Phone: [phone] Fax: [phone]  [facility] [address] ([address]), [place] - [address] [place]) [place] [address] Phone: [phone] Fax: [phone]

## 2023-10-12 NOTE — ED Notes (Signed)
 MD at bedside.

## 2023-10-12 NOTE — ED Notes (Signed)
 Patient had another episode of diarrhea. Pt cleaned up and fresh brief on. Denies further needs at this time.

## 2023-10-12 NOTE — ED Notes (Signed)
 Pt called out for bed pan RN and NT informed

## 2023-10-12 NOTE — ED Notes (Signed)
 Patient had episode of diarrhea stool. Patient cleaned up and specimen sent to lab. Linen changed and fresh chux under patient.

## 2023-10-12 NOTE — ED Provider Notes (Signed)
 Emergency Medicine Observation Re-evaluation Note  JAYDRIEN WASSENAAR is a 68 y.o. male, seen on rounds today.  Pt initially presented to the ED for complaints of Diarrhea Currently, the patient is calm and cooperative.  Physical Exam  BP 126/71 (BP Location: Right Wrist)   Pulse 81   Temp 98.3 F (36.8 C)   Resp 16   Ht 6' 4 (1.93 m)   Wt (!) 176.9 kg   SpO2 95%   BMI 47.47 kg/m  Physical Exam General: Awake. Alert. No acute distress Cardiac: Regular rate rhythm Lungs: Clear to auscultation bilaterally Psych: Calm and cooperative  ED Course / MDM  EKG:   I have reviewed the labs performed to date as well as medications administered while in observation.  Recent changes in the last 24 hours include: Patient was put up for dischargeToday but unable to return home as his sister who he lives with stated she did not want him to return to the house due to inability to care for him.  [facility] police were called to the house but no one answered.  Awaiting plan for safe disposition.  Home meds have been ordered.  Plan  Current plan is for continued boarding in the ED awaiting safe plan for discharge per social work team.    Pamella Ozell LABOR, DO 10/12/23 1716

## 2023-10-12 NOTE — ED Notes (Signed)
 Meal tray at bedside.

## 2023-10-12 NOTE — ED Notes (Signed)
 Ordering hospital bed for patient.

## 2023-10-13 NOTE — Evaluation (Signed)
 Physical Therapy Evaluation Patient Details Name: Gary [name] MRN: [id number] DOB: 1955-05-20 Today's Date: 10/13/2023  History of Present Illness  Patient is 68 y.o. male presenting from home to the ED on 10/11/23 with abdominal cramping and diarrhea. PMH significant for HFpEF (60-65%), AAA, CKD IIIa, COPD, HTN, morbid obesity, gastric ulcers, Tracheostomy in 2023.   Clinical Impression  Gary [name] is 68 y.o. male admitted with above HPI and diagnosis. Patient is currently limited by functional impairments below (see PT problem list). Patient lives with niece and PTA was mobilizing short distances with RW in home and had an aide 3 hrs/day M-Sun for assist with bathing at baseline. Pt reports niece does all home making and meals. Patient is currently limited by weakness and declined attempt at EOB or OOB today. Pt will benefit from +2 assist for safety OOB mobility and chair follow to progress gait. Patient will benefit from continued skilled PT interventions to address impairments and progress independence with mobility. Patient will benefit from continued inpatient follow up therapy, <3 hours/day. Acute PT will follow and progress as able.         If plan is discharge home, recommend the following: Two people to help with walking and/or transfers;Two people to help with bathing/dressing/bathroom;Assistance with cooking/housework;Assist for transportation;Help with stairs or ramp for entrance   Can travel by private vehicle   No    Equipment Recommendations None recommended by PT (defer to next venue)  Recommendations for Other Services       Functional Status Assessment Patient has had a recent decline in their functional status and demonstrates the ability to make significant improvements in function in a reasonable and predictable amount of time.     Precautions / Restrictions Precautions Precautions: Fall Recall of Precautions/Restrictions: Intact Precaution/Restrictions  Comments: trach with PMV Restrictions Weight Bearing Restrictions Per Provider Order: No      Mobility  Bed Mobility Overal bed mobility: Needs Assistance             General bed mobility comments: refused OOB, bed level testing completed    Transfers                        Ambulation/Gait                  Stairs            Wheelchair Mobility     Tilt Bed    Modified Rankin (Stroke Patients Only)       Balance                                             Pertinent Vitals/Pain Pain Assessment Pain Assessment: Faces Faces Pain Scale: Hurts a little bit Pain Location: Lt lower leg Pain Descriptors / Indicators: Aching, Discomfort Pain Intervention(s): Limited activity within patient's tolerance, Monitored during session    Home Living Family/patient expects to be discharged to:: Private residence Living Arrangements: Other relatives (neice for  4months) Available Help at Discharge: Available PRN/intermittently;Family;Personal care attendant Type of Home: Apartment Home Access: Level entry;Ramped entrance       Home Layout: One level Home Equipment: Wheelchair - Nurse, children's (2 wheels);Hospital bed Additional Comments: patient has a daily aide for ~ 3 hours a day 7x/week (aides) had HH 1x/wk for ~ 3wks    Prior Function Prior Level  of Function : Needs assist             Mobility Comments: short distance ambulation with rolling walker ADLs Comments: wash up in bed with assist of Aides,  *reports he has a bench but hasn't tried it*     Extremity/Trunk Assessment   Upper Extremity Assessment Upper Extremity Assessment: Defer to OT evaluation    Lower Extremity Assessment Lower Extremity Assessment: Overall WFL for tasks assessed;RLE deficits/detail;LLE deficits/detail RLE Deficits / Details: grossly 3+/5 for hip, knee, and ankle MMT at bed level LLE Deficits / Details: grossly 3+/5 for  hip, knee, and ankle MMT at bed level    Cervical / Trunk Assessment Cervical / Trunk Assessment: Other exceptions Cervical / Trunk Exceptions: habitus  Communication   Communication Communication: No apparent difficulties    Cognition Arousal: Alert Behavior During Therapy: WFL for tasks assessed/performed   PT - Cognitive impairments: No apparent impairments                         Following commands: Intact       Cueing Cueing Techniques: Verbal cues     General Comments      Exercises     Assessment/Plan    PT Assessment Patient needs continued PT services  PT Problem List Decreased strength;Decreased activity tolerance;Decreased balance;Decreased mobility;Cardiopulmonary status limiting activity;Obesity       PT Treatment Interventions DME instruction;Gait training;Stair training;Functional mobility training;Therapeutic activities;Therapeutic exercise;Balance training;Patient/family education;Wheelchair mobility training    PT Goals (Current goals can be found in the Care Plan section)  Acute Rehab PT Goals Patient Stated Goal: get walking PT Goal Formulation: With patient Time For Goal Achievement: 10/27/23 Potential to Achieve Goals: Good    Frequency Min 2X/week     Co-evaluation               AM-PAC PT 6 Clicks Mobility  Outcome Measure Help needed turning from your back to your side while in a flat bed without using bedrails?: Total Help needed moving from lying on your back to sitting on the side of a flat bed without using bedrails?: Total Help needed moving to and from a bed to a chair (including a wheelchair)?: Total Help needed standing up from a chair using your arms (e.g., wheelchair or bedside chair)?: Total Help needed to walk in hospital room?: Total Help needed climbing 3-5 steps with a railing? : Total 6 Click Score: 6    End of Session Equipment Utilized During Treatment: Oxygen  Activity Tolerance: Patient  tolerated treatment well Patient left: in bed;with call bell/phone within reach Nurse Communication: Mobility status;Need for lift equipment PT Visit Diagnosis: Other abnormalities of gait and mobility (R26.89);Muscle weakness (generalized) (M62.81);Difficulty in walking, not elsewhere classified (R26.2)    Time: 8497-8481 PT Time Calculation (min) (ACUTE ONLY): 16 min   Charges:   PT Evaluation $PT Eval Moderate Complexity: 1 Mod   PT General Charges $$ ACUTE PT VISIT: 1 Visit         Vernell DONEEN KLEIN, DPT Acute Rehabilitation Services Office [phone]  10/13/23 3:38 PM

## 2023-10-13 NOTE — ED Notes (Signed)
 Resp at bedside. Trach suction

## 2023-10-13 NOTE — ED Notes (Signed)
 Patient is resting comfortably.

## 2023-10-13 NOTE — Care Management (Addendum)
 Called sister [name] Left message to return call ASAP 1120 Discussed with CSW, have not been able to reach family, PT consult placed for recommendations. Nursing informed 1400 went to see patient to give him a update, currently sleeping, No call backs from family.

## 2023-10-13 NOTE — ED Provider Notes (Signed)
 Emergency Medicine Observation Re-evaluation Note  Gary [name] is a 68 y.o. male, seen on rounds today.  Pt initially presented to the ED for complaints of Diarrhea Currently, the patient is calm and cooperative.  Physical Exam  BP 126/80   Pulse 84   Temp 98.3 F (36.8 C)   Resp 18   Ht 6' 4 (1.93 m)   Wt (!) 176.9 kg   SpO2 93%   BMI 47.47 kg/m  Physical Exam General: Awake. Alert. No acute distress Cardiac: Regular rate rhythm Lungs: Clear to auscultation bilaterally Psych: Calm and cooperative  ED Course / MDM  EKG:   I have reviewed the labs performed to date as well as medications administered while in observation.  Recent changes in the last 24 hours include no acute events.  Plan  Current plan is for continued boarding in the ED awaiting placement.    Gary [name] LABOR, DO 10/13/23 1352

## 2023-10-13 NOTE — Progress Notes (Signed)
 CSW attempted to reach each of patient's family members without success - several voicemails were left requesting a return call.  Niels Portugal, MSW, LCSW Transitions of Care  Clinical Social Worker II [phone]

## 2023-10-13 NOTE — ED Notes (Signed)
 Patient resting quietly.  Respirations even and unlabored.  Door to room remains open per patient request.

## 2023-10-14 ENCOUNTER — Encounter (HOSPITAL_COMMUNITY): Payer: Self-pay

## 2023-10-14 MED ORDER — ALBUTEROL SULFATE (2.5 MG/3ML) 0.083% IN NEBU
INHALATION_SOLUTION | RESPIRATORY_TRACT | Status: AC
  Filled 2023-10-14: qty 3

## 2023-10-14 NOTE — Evaluation (Signed)
 Occupational Therapy Evaluation Patient Details Name: Gary [name] MRN: [id number] DOB: [birth year] Today's Date: 10/14/2023   History of Present Illness   Patient is 68 y.o. male presenting from home to the ED on 10/11/23 with abdominal cramping and diarrhea. PMH significant for HFpEF (60-65%), AAA, CKD IIIa, COPD, HTN, morbid obesity, gastric ulcers, Tracheostomy in 2023.     Clinical Impressions Gary [name] was evaluated s/p the above admission list. He has assist from a PCA days/wk with ADLs and mobilizes short distances with AD at baseline. Upon evaluation the pt was limited by SOB, global weakness, overall debility, increased SpO2 needs, report of dizziness and seeing flashes, and poor activity tolerance. Overall he will need +2 assist for mobility, pt began to complete bed mobility but became SOB with rolling and deferred further attempts. Due to the deficits listed below the pt also needs up to total A for LB ADLs and min A for UB ADLs with significantly increased time. Of note, pt's SpO2 was  satting at 82% on arrival with 5L [place]. Titrated to 6L with good recovery to >90%. With any small movements pt quickly desatted to 86% and needed cues for [facility] to recover. Pt will benefit from continued acute OT services and skilled inpatient follow up therapy, <3 hours/day.      If plan is discharge home, recommend the following:   A lot of help with walking and/or transfers;A lot of help with bathing/dressing/bathroom;Assistance with cooking/housework;Assistance with feeding;Help with stairs or ramp for entrance;Assist for transportation     Functional Status Assessment   Patient has had a recent decline in their functional status and demonstrates the ability to make significant improvements in function in a reasonable and predictable amount of time.     Equipment Recommendations   None recommended by OT     Recommendations for Other Services         Precautions/Restrictions    Precautions Precautions: Fall Recall of Precautions/Restrictions: Intact Precaution/Restrictions Comments: trach with PMV Restrictions Weight Bearing Restrictions Per Provider Order: No     Mobility Bed Mobility Overal bed mobility: Needs Assistance Bed Mobility: Rolling Rolling: Min assist         General bed mobility comments: attempted to encourage EOB, pt began bed mobiliyt and then quit due to SOB    Transfers              ADL either performed or assessed with clinical judgement   ADL Overall ADL's : Needs assistance/impaired Eating/Feeding: Set up;Sitting   Grooming: Set up;Sitting   Upper Body Bathing: Minimal assistance;Sitting   Lower Body Bathing: Maximal assistance;Bed level   Upper Body Dressing : Minimal assistance;Sitting   Lower Body Dressing: Total assistance;Bed level   Toilet Transfer: Total assistance   Toileting- Clothing Manipulation and Hygiene: Total assistance       Functional mobility during ADLs: Minimal assistance General ADL Comments: limited to bed level as pt refused getting OOB due to dizziness and seeing stars.     Vision Baseline Vision/History: 0 No visual deficits Vision Assessment?: No apparent visual deficits     Perception Perception: Not tested       Praxis Praxis: Not tested       Pertinent Vitals/Pain Pain Assessment Pain Assessment: Faces Faces Pain Scale: Hurts a little bit Pain Location: all over Pain Descriptors / Indicators: Aching, Discomfort Pain Intervention(s): Limited activity within patient's tolerance     Extremity/Trunk Assessment Upper Extremity Assessment Upper Extremity Assessment: Generalized weakness (ROM and stength WFL for  tasks assessed)   Lower Extremity Assessment Lower Extremity Assessment: Defer to PT evaluation   Cervical / Trunk Assessment Cervical / Trunk Assessment: Other exceptions Cervical / Trunk Exceptions: habitus   Communication  Communication Communication: No apparent difficulties   Cognition Arousal: Alert Behavior During Therapy: WFL for tasks assessed/performed Cognition: No apparent impairments                               Following commands: Intact       Cueing  General Comments   Cueing Techniques: Verbal cues  SpO2 82% on arrival with 5L, titrated to 6L wtih recovery to 92%. Any time pt moved his SpO2 would significantly drop to 80s   Exercises Exercises: Other exercises Other Exercises Other Exercises: pt was doing bed level leg raises and hip abduction exercises as this therapist left the room. Pt asking for green and red theraband   Shoulder Instructions      Home Living Family/patient expects to be discharged to:: Private residence Living Arrangements: Other relatives Available Help at Discharge: Available PRN/intermittently;Family;Personal care attendant Type of Home: Apartment Home Access: Level entry;Ramped entrance     Home Layout: One level     Bathroom Shower/Tub: Chief Strategy Officer: Handicapped height Bathroom Accessibility: No   Home Equipment: Wheelchair - Nurse, children's (2 wheels);Hospital bed   Additional Comments: patient has a daily aide for ~ 3 hours a day 7x/week (aides) had HH 1x/wk for ~ 3wks      Prior Functioning/Environment Prior Level of Function : Needs assist             Mobility Comments: short distance ambulation with rolling walker ADLs Comments: wash up in bed with assist of Aides,  *reports he has a bench but hasn't tried it*    OT Problem List: Decreased strength;Decreased range of motion;Impaired balance (sitting and/or standing);Decreased activity tolerance;Decreased knowledge of use of DME or AE;Decreased knowledge of precautions;Cardiopulmonary status limiting activity   OT Treatment/Interventions: Self-care/ADL training;Therapeutic exercise;DME and/or AE instruction;Therapeutic  activities;Energy conservation;Balance training;Patient/family education      OT Goals(Current goals can be found in the care plan section)   Acute Rehab OT Goals Patient Stated Goal: home OT Goal Formulation: With patient Time For Goal Achievement: 10/28/23 Potential to Achieve Goals: Good ADL Goals Pt Will Perform Lower Body Dressing: with mod assist;sit to/from stand Pt Will Transfer to Toilet: Independently;stand pivot transfer Additional ADL Goal #1: Pt will complete bed mobility with supervision A as a precursor to ADLs Additional ADL Goal #2: Pt will tolerate at least 10 minutes of unsupported sitting to prepare for EOB ADLs   OT Frequency:  Min 2X/week       AM-PAC OT 6 Clicks Daily Activity     Outcome Measure Help from another person eating meals?: A Little Help from another person taking care of personal grooming?: A Little Help from another person toileting, which includes using toliet, bedpan, or urinal?: A Lot Help from another person bathing (including washing, rinsing, drying)?: A Lot Help from another person to put on and taking off regular upper body clothing?: A Little Help from another person to put on and taking off regular lower body clothing?: Total 6 Click Score: 14   End of Session Nurse Communication: Mobility status  Activity Tolerance: Patient tolerated treatment well Patient left: in bed;with call bell/phone within reach  OT Visit Diagnosis: Unsteadiness on feet (R26.81);Other abnormalities of gait and  mobility (R26.89);Muscle weakness (generalized) (M62.81)                Time: 9077-9061 OT Time Calculation (min): 16 min Charges:  OT General Charges $OT Visit: 1 Visit OT Evaluation $OT Eval Moderate Complexity: 1 Mod  Lucie Kendall, OTR/L Acute Rehabilitation Services Office [phone] Secure Chat Communication Preferred   Lucie JONETTA Kendall 10/14/2023, 10:11 AM

## 2023-10-14 NOTE — [place] FL2 (Signed)
 [place]  MEDICAID FL2 LEVEL OF CARE FORM     IDENTIFICATION  Patient Name: Gary [name] Birthdate: Dec 16, 1955 Sex: male Admission Date (Current Location): 10/11/2023  [facility] and [place] Number:  Producer, television/film/video and Address:  [facility], [address], [place], [place] [address]      Provider Number: [id number]  Attending Physician Name and Address:  System, Provider Not In  Relative Name and Phone Number:       Current Level of Care: Hospital Recommended Level of Care: Skilled Nursing Facility Prior Approval Number:    Date Approved/Denied:   PASRR Number: [id number]  Discharge Plan: SNF    Current Diagnoses: Patient Active Problem List   Diagnosis Date Noted   CHF (congestive heart failure) ([name]) 06/07/2023   Diarrhea 05/17/2023   Rectal bleeding 05/16/2023   Tracheitis 02/26/2023   Dry skin 01/25/2023   Eyelid cyst, right 01/23/2023   Fluid overload 11/30/2022   Acute on chronic respiratory failure ([name]) 11/02/2022   History of DVT (deep vein thrombosis) 10/27/2022   Acute kidney injury superimposed on CKD ([name]) 10/27/2022   Acute on chronic respiratory failure with hypoxia ([name]) 10/17/2022   Chronic colitis 09/12/2022   Segmental colitis associated with diverticulosis ([name]) 09/12/2022   Diverticulosis of colon with hemorrhage 09/10/2022   Acute blood loss anemia 09/08/2022   Acute GI bleeding 09/07/2022   Chest pain 08/13/2022   Influenza A 04/11/2022   Hypokalemia 03/06/2022   Chronic obstructive pulmonary disease (COPD) ([name]) 02/03/2022   GERD without esophagitis 02/03/2022   Anxiety 02/03/2022   Melena    Major depressive disorder, recurrent episode, moderate ([name]) 10/22/2021   Pressure injury of skin 09/27/2021   COPD (chronic obstructive pulmonary disease) ([name])    Iron  deficiency anemia    Generalized weakness    CAP (community acquired pneumonia) due to Pneumococcus ([name]) 03/11/2021   (HFpEF) heart failure  with preserved ejection fraction ([name])    Hyperlipidemia 02/19/2021   Generalized osteoarthritis of multiple sites 10/31/2020   Hyperkalemia 03/04/2020   Marijuana use 01/17/2020   Thrombocytopenia ([name]) 01/17/2020   Positive hepatitis C antibody test 01/17/2020   Osteoarthritis of glenohumeral joints (Bilateral) 01/17/2020   Osteoarthritis of AC (acromioclavicular) joints (Bilateral) 01/17/2020   Polysubstance abuse ([name]) 08/25/2019   Toxic metabolic encephalopathy 08/25/2019   Chronic pain syndrome 07/14/2019   Anticoagulated 07/14/2019   DDD (degenerative disc disease), lumbosacral 07/14/2019   DDD (degenerative disc disease), cervical 07/14/2019   Acute on chronic respiratory failure with hypoxia and hypercapnia ([name]) 05/28/2018   Acute on chronic diastolic CHF (congestive heart failure) ([name]) 02/28/2015   OSA (obstructive sleep apnea) 02/28/2015   Morbid obesity with BMI of 50.0-59.9, adult ([name]) 04/01/2014   Hypotension 04/01/2014    Orientation RESPIRATION BLADDER Height & Weight     Self, Time, Situation, Place  Tracheostomy, O2 (4L) Incontinent Weight: (!) 390 lb (176.9 kg) Height:  6' 4 (193 cm)  BEHAVIORAL SYMPTOMS/MOOD NEUROLOGICAL BOWEL NUTRITION STATUS      Incontinent Diet (Regular)  AMBULATORY STATUS COMMUNICATION OF NEEDS Skin   Extensive Assist Verbally Normal                       Personal Care Assistance Level of Assistance  Bathing, Feeding, Dressing Bathing Assistance: Maximum assistance Feeding assistance: Independent Dressing Assistance: Maximum assistance     Functional Limitations Info  Sight, Hearing, Speech Sight Info: Adequate Hearing Info: Adequate Speech Info: Adequate  SPECIAL CARE FACTORS FREQUENCY  OT (By licensed OT), PT (By licensed PT)     PT Frequency: 5x weekly OT Frequency: 5x weekly            Contractures Contractures Info: Not present    Additional Factors Info  Code Status, Allergies Code Status Info:  Full Code Allergies Info: No known allergies           Current Medications (10/14/2023):  This is the current hospital active medication list Current Facility-Administered Medications  Medication Dose Route Frequency Provider Last Rate Last Admin   amphetamine -dextroamphetamine  (ADDERALL) tablet 30 mg  30 mg Oral Q breakfast Penna, Michael A, DO       artificial tears ophthalmic solution 1 drop  1 drop Both Eyes PRN Pamella Sharper A, DO       clotrimazole  (LOTRIMIN ) 1 % cream   Topical BID Penna, Michael A, DO       escitalopram  (LEXAPRO ) tablet 10 mg  10 mg Oral Daily Penna, Michael A, DO   10 mg at 10/13/23 9057   fluticasone  furoate-vilanterol (BREO ELLIPTA) 100-25 MCG/ACT 1 puff  1 puff Inhalation Daily Penna, Michael A, DO   1 puff at 10/13/23 9056   hydrOXYzine  (ATARAX ) tablet 25 mg  25 mg Oral TID PRN Penna, Michael A, DO   25 mg at 10/14/23 0149   ipratropium-albuterol  (DUONEB) 0.5-2.5 (3) MG/3ML nebulizer solution 3 mL  3 mL Nebulization Once Tee, Andrew R, MD       ipratropium-albuterol  (DUONEB) 0.5-2.5 (3) MG/3ML nebulizer solution 3 mL  3 mL Nebulization Q6H PRN Pamella Sharper LABOR, DO       mesalamine  (LIALDA ) EC tablet 1.2 g  1.2 g Oral BID Penna, Michael A, DO   1.2 g at 10/13/23 2242   oxyCODONE  (Oxy IR/ROXICODONE ) immediate release tablet 5 mg  5 mg Oral BID PRN Penna, Michael A, DO       pantoprazole  (PROTONIX ) EC tablet 40 mg  40 mg Oral QHS Penna, Michael A, DO   40 mg at 10/13/23 2242   senna-docusate (Senokot-S) tablet 2 tablet  2 tablet Oral QHS PRN Penna, Michael A, DO       spironolactone  (ALDACTONE ) tablet 25 mg  25 mg Oral Daily Penna, Michael A, DO   25 mg at 10/13/23 9057   torsemide  (DEMADEX ) tablet 40 mg  40 mg Oral BID Penna, Michael A, DO   40 mg at 10/13/23 1812   traZODone  (DESYREL ) tablet 50 mg  50 mg Oral QHS PRN Penna, Michael A, DO       Current Outpatient Medications  Medication Sig Dispense Refill   amoxicillin -clavulanate (AUGMENTIN ) 875-125 MG tablet  Take 1 tablet by mouth 2 (two) times daily for 7 days. 14 tablet 0   amphetamine -dextroamphetamine  (ADDERALL) 30 MG tablet Take 1 tablet by mouth daily with breakfast. 30 tablet 0   clotrimazole  (LOTRIMIN ) 1 % cream Apply topically 2 (two) times daily. 60 g 0   escitalopram  (LEXAPRO ) 10 MG tablet Take 1 tablet (10 mg total) by mouth daily. 30 tablet 2   mesalamine  (LIALDA ) 1.2 g EC tablet Take 1 tablet (1.2 g total) by mouth 2 (two) times daily. 60 tablet 2   oxyCODONE  (OXY IR/ROXICODONE ) 5 MG immediate release tablet Take 1 tablet (5 mg total) by mouth 2 (two) times daily as needed for moderate pain (pain score 4-6) or severe pain (pain score 7-10). 10 tablet 0   spironolactone  (ALDACTONE ) 25 MG tablet Take 1 tablet (25  mg total) by mouth daily. 30 tablet 2   torsemide  (DEMADEX ) 20 MG tablet Take 2 tablets (40 mg total) by mouth 2 (two) times daily. 120 tablet 2   hydrOXYzine  (ATARAX ) 25 MG tablet Take 1 tablet (25 mg total) by mouth 3 (three) times daily as needed for anxiety. 90 tablet 0   ipratropium-albuterol  (DUONEB) 0.5-2.5 (3) MG/3ML SOLN Take 3 mLs by nebulization every 6 (six) hours as needed. 120 mL 2   mometasone -formoterol  (DULERA ) 100-5 MCG/ACT AERO Inhale 2 puffs into the lungs 2 (two) times daily. 13 g 2   pantoprazole  (PROTONIX ) 40 MG tablet Take 1 tablet (40 mg total) by mouth at bedtime. 30 tablet 2   polyvinyl alcohol  (LIQUIFILM TEARS) 1.4 % ophthalmic solution Place 1 drop into both eyes as needed for dry eyes. 15 mL 0   senna-docusate (SENOKOT-S) 8.6-50 MG tablet Take 2 tablets by mouth at bedtime as needed for mild constipation. Home med.     traZODone  (DESYREL ) 50 MG tablet Take 1 tablet (50 mg total) by mouth at bedtime as needed for sleep. 30 tablet 0     Discharge Medications: Please see discharge summary for a list of discharge medications.  Relevant Imaging Results:  Relevant Lab Results:   Additional Information SS# [id number]  Niels LITTIE Portugal,  LCSW

## 2023-10-14 NOTE — Progress Notes (Addendum)
 12:30pm: Per Tammy, a bed offer can be made from [facility]. Tammy met with patient at bedside who was agreeable to accept this bed offer. Tammy states a new CPAP will have to be ordered due to patient not having access to the home to obtain his other one. Tammy requesting clarity on CPAP settings - Lavern, RN CM to assist. [employer] authorization can be initiated.  9:20am: CSW spoke with Jon Piper of [facility] who to discuss patient. Jon states [facility] can accept patient if the FiO2 is below 28% (current flowsheet data states 36%).  8:30am: CSW spoke with Madelin Kohler of [facility] to discuss patient. Both [facility] and [address] are considering patient for admission - nursing team currently reviewing clinicals.  7:35am: CSW completed FL2 and faxed patient's clinicals out in attempt to obtain SNF bed offers.  Niels Portugal, MSW, LCSW Transitions of Care  Clinical Social Worker II [phone]

## 2023-10-14 NOTE — Discharge Planning (Signed)
 RNCM  consulted regarding CPAP settings for home use.  RNCM called [employer] (CPAP supplier) and is awaiting a phone call to obtain settings for possible new CPAP use after discharge in a facility.  Will update chart when call returned by DME company.  Jameel Quant J. Debarah, BSN, RN, NCM  Transitions of Care  Nurse Case Manager  [facility] Emergency Departments  Operative Services  [phone]

## 2023-10-14 NOTE — ED Notes (Signed)
 Pt's o2 saturation would not increase from 70's despite repositioning and increasing LPM to 6. RT was called to bs.

## 2023-10-14 NOTE — Progress Notes (Signed)
 Patient desating in the 47's. Nasal cannula taken off and placed on trach collar. Patient suctioned and inner cannula changed. No increase in sats. Alb treatment given and sats are now 98% on 40% ATC.

## 2023-10-14 NOTE — ED Provider Notes (Signed)
 Emergency Medicine Observation Re-evaluation Note  Gary [name] is a 68 y.o. male, seen on rounds today.  Pt initially presented to the ED for complaints of Diarrhea Currently, the patient is awaiting placement to nursing facility.  Physical Exam  BP (!) 143/84   Pulse 88   Temp 98.3 F (36.8 C) (Axillary)   Resp 18   Ht 1.93 m (6' 4)   Wt (!) 176.9 kg   SpO2 92%   BMI 47.47 kg/m  Physical Exam General: Nontoxic no acute distress Cardiac:  Lungs: No respiratory Psych: Baseline  ED Course / MDM  EKG:   I have reviewed the labs performed to date as well as medications administered while in observation.  Recent changes in the last 24 hours include still awaiting placement to nursing facility.  Plan  Current plan is for placement to nursing facility.    Cambren Helm, MD 10/14/23 [phone]

## 2023-10-15 ENCOUNTER — Emergency Department (HOSPITAL_COMMUNITY)

## 2023-10-15 ENCOUNTER — Inpatient Hospital Stay (HOSPITAL_COMMUNITY)

## 2023-10-15 DIAGNOSIS — J95851 Ventilator associated pneumonia: Secondary | ICD-10-CM | POA: Diagnosis present

## 2023-10-15 DIAGNOSIS — N179 Acute kidney failure, unspecified: Secondary | ICD-10-CM | POA: Diagnosis present

## 2023-10-15 DIAGNOSIS — J9622 Acute and chronic respiratory failure with hypercapnia: Secondary | ICD-10-CM | POA: Diagnosis not present

## 2023-10-15 DIAGNOSIS — J9602 Acute respiratory failure with hypercapnia: Secondary | ICD-10-CM | POA: Diagnosis not present

## 2023-10-15 DIAGNOSIS — G928 Other toxic encephalopathy: Secondary | ICD-10-CM | POA: Diagnosis present

## 2023-10-15 DIAGNOSIS — N1831 Chronic kidney disease, stage 3a: Secondary | ICD-10-CM | POA: Diagnosis present

## 2023-10-15 DIAGNOSIS — Y95 Nosocomial condition: Secondary | ICD-10-CM | POA: Diagnosis present

## 2023-10-15 DIAGNOSIS — G9341 Metabolic encephalopathy: Secondary | ICD-10-CM | POA: Diagnosis not present

## 2023-10-15 DIAGNOSIS — J189 Pneumonia, unspecified organism: Secondary | ICD-10-CM | POA: Diagnosis not present

## 2023-10-15 DIAGNOSIS — I5032 Chronic diastolic (congestive) heart failure: Secondary | ICD-10-CM | POA: Diagnosis present

## 2023-10-15 DIAGNOSIS — J9621 Acute and chronic respiratory failure with hypoxia: Secondary | ICD-10-CM | POA: Diagnosis present

## 2023-10-15 DIAGNOSIS — Z7401 Bed confinement status: Secondary | ICD-10-CM | POA: Diagnosis not present

## 2023-10-15 DIAGNOSIS — I130 Hypertensive heart and chronic kidney disease with heart failure and stage 1 through stage 4 chronic kidney disease, or unspecified chronic kidney disease: Secondary | ICD-10-CM | POA: Diagnosis present

## 2023-10-15 DIAGNOSIS — J9601 Acute respiratory failure with hypoxia: Secondary | ICD-10-CM | POA: Diagnosis not present

## 2023-10-15 DIAGNOSIS — I5022 Chronic systolic (congestive) heart failure: Secondary | ICD-10-CM | POA: Diagnosis not present

## 2023-10-15 DIAGNOSIS — A4152 Sepsis due to Pseudomonas: Secondary | ICD-10-CM | POA: Diagnosis not present

## 2023-10-15 DIAGNOSIS — E874 Mixed disorder of acid-base balance: Secondary | ICD-10-CM | POA: Diagnosis present

## 2023-10-15 DIAGNOSIS — F32A Depression, unspecified: Secondary | ICD-10-CM | POA: Diagnosis present

## 2023-10-15 DIAGNOSIS — Z8616 Personal history of COVID-19: Secondary | ICD-10-CM | POA: Diagnosis not present

## 2023-10-15 DIAGNOSIS — I5030 Unspecified diastolic (congestive) heart failure: Secondary | ICD-10-CM | POA: Diagnosis not present

## 2023-10-15 DIAGNOSIS — E662 Morbid (severe) obesity with alveolar hypoventilation: Secondary | ICD-10-CM | POA: Diagnosis present

## 2023-10-15 DIAGNOSIS — D649 Anemia, unspecified: Secondary | ICD-10-CM | POA: Diagnosis present

## 2023-10-15 DIAGNOSIS — R4182 Altered mental status, unspecified: Secondary | ICD-10-CM | POA: Diagnosis not present

## 2023-10-15 DIAGNOSIS — J151 Pneumonia due to Pseudomonas: Secondary | ICD-10-CM | POA: Diagnosis present

## 2023-10-15 DIAGNOSIS — Z930 Tracheostomy status: Secondary | ICD-10-CM | POA: Diagnosis not present

## 2023-10-15 DIAGNOSIS — J440 Chronic obstructive pulmonary disease with acute lower respiratory infection: Secondary | ICD-10-CM | POA: Diagnosis present

## 2023-10-15 DIAGNOSIS — J449 Chronic obstructive pulmonary disease, unspecified: Secondary | ICD-10-CM | POA: Diagnosis present

## 2023-10-15 DIAGNOSIS — R197 Diarrhea, unspecified: Secondary | ICD-10-CM | POA: Diagnosis present

## 2023-10-15 DIAGNOSIS — K5792 Diverticulitis of intestine, part unspecified, without perforation or abscess without bleeding: Secondary | ICD-10-CM | POA: Diagnosis not present

## 2023-10-15 DIAGNOSIS — Z1152 Encounter for screening for COVID-19: Secondary | ICD-10-CM | POA: Diagnosis not present

## 2023-10-15 DIAGNOSIS — Y848 Other medical procedures as the cause of abnormal reaction of the patient, or of later complication, without mention of misadventure at the time of the procedure: Secondary | ICD-10-CM | POA: Diagnosis present

## 2023-10-15 DIAGNOSIS — Z6841 Body Mass Index (BMI) 40.0 and over, adult: Secondary | ICD-10-CM | POA: Diagnosis not present

## 2023-10-15 DIAGNOSIS — R6520 Severe sepsis without septic shock: Secondary | ICD-10-CM | POA: Diagnosis not present

## 2023-10-15 DIAGNOSIS — R609 Edema, unspecified: Secondary | ICD-10-CM | POA: Diagnosis not present

## 2023-10-15 LAB — CBC WITH DIFFERENTIAL/PLATELET
Abs Immature Granulocytes: 0.16 10*3/uL — ABNORMAL HIGH (ref 0.00–0.07)
Basophils Absolute: 0 10*3/uL (ref 0.0–0.1)
Basophils Relative: 0 %
Eosinophils Absolute: 0.1 10*3/uL (ref 0.0–0.5)
Eosinophils Relative: 1 %
HCT: 39.8 % (ref 39.0–52.0)
Hemoglobin: 9.5 g/dL — ABNORMAL LOW (ref 13.0–17.0)
Immature Granulocytes: 2 %
Lymphocytes Relative: 8 %
Lymphs Abs: 0.8 10*3/uL (ref 0.7–4.0)
MCH: 19.8 pg — ABNORMAL LOW (ref 26.0–34.0)
MCHC: 23.9 g/dL — ABNORMAL LOW (ref 30.0–36.0)
MCV: 82.7 fL (ref 80.0–100.0)
Monocytes Absolute: 1.3 10*3/uL — ABNORMAL HIGH (ref 0.1–1.0)
Monocytes Relative: 12 %
Neutro Abs: 8.5 10*3/uL — ABNORMAL HIGH (ref 1.7–7.7)
Neutrophils Relative %: 77 %
Platelets: 178 10*3/uL (ref 150–400)
RBC: 4.81 MIL/uL (ref 4.22–5.81)
RDW: 19.4 % — ABNORMAL HIGH (ref 11.5–15.5)
WBC: 10.9 10*3/uL — ABNORMAL HIGH (ref 4.0–10.5)
nRBC: 2.3 % — ABNORMAL HIGH (ref 0.0–0.2)

## 2023-10-15 LAB — BLOOD GAS, VENOUS
Acid-Base Excess: 15 mmol/L — ABNORMAL HIGH (ref 0.0–2.0)
Bicarbonate: 46.5 mmol/L — ABNORMAL HIGH (ref 20.0–28.0)
O2 Saturation: 49.5 %
Patient temperature: 37
pCO2, Ven: 119 mmHg (ref 44–60)
pH, Ven: 7.2 — ABNORMAL LOW (ref 7.25–7.43)
pO2, Ven: <31 mmHg — CL (ref 32–45)

## 2023-10-15 LAB — BASIC METABOLIC PANEL WITH GFR
Anion gap: 8 (ref 5–15)
BUN: 24 mg/dL — ABNORMAL HIGH (ref 8–23)
CO2: 35 mmol/L — ABNORMAL HIGH (ref 22–32)
Calcium: 8 mg/dL — ABNORMAL LOW (ref 8.9–10.3)
Chloride: 97 mmol/L — ABNORMAL LOW (ref 98–111)
Creatinine, Ser: 1.66 mg/dL — ABNORMAL HIGH (ref 0.61–1.24)
GFR, Estimated: 45 mL/min — ABNORMAL LOW (ref >60)
Glucose, Bld: 131 mg/dL — ABNORMAL HIGH (ref 70–99)
Potassium: 4.1 mmol/L (ref 3.5–5.1)
Sodium: 140 mmol/L (ref 135–145)

## 2023-10-15 MED ORDER — FAMOTIDINE 20 MG PO TABS
20.0000 mg | ORAL_TABLET | Freq: Two times a day (BID) | ORAL | Status: DC
Start: 2023-10-16 — End: 2023-10-19
  Administered 2023-10-16 – 2023-10-19 (×6): 20 mg
  Filled 2023-10-15 (×6): qty 1

## 2023-10-15 MED ORDER — PROPOFOL 1000 MG/100ML IV EMUL
0.0000 ug/kg/min | INTRAVENOUS | Status: DC
  Administered 2023-10-16 (×2): 50 ug/kg/min via INTRAVENOUS
  Administered 2023-10-16: 25 ug/kg/min via INTRAVENOUS
  Administered 2023-10-16 (×2): 15 ug/kg/min via INTRAVENOUS
  Administered 2023-10-16: 10 ug/kg/min via INTRAVENOUS
  Administered 2023-10-17: 15 ug/kg/min via INTRAVENOUS
  Filled 2023-10-15 (×7): qty 100

## 2023-10-15 MED ORDER — ORAL CARE MOUTH RINSE: 15.0000 mL | OROMUCOSAL | Status: DC | PRN

## 2023-10-15 MED ORDER — METRONIDAZOLE 500 MG/100ML IV SOLN
500.0000 mg | Freq: Once | INTRAVENOUS | Status: AC
  Administered 2023-10-15: 500 mg via INTRAVENOUS
  Filled 2023-10-15: qty 100

## 2023-10-15 MED ORDER — HEPARIN SODIUM (PORCINE) 5000 UNIT/ML IJ SOLN
5000.0000 [IU] | Freq: Three times a day (TID) | INTRAMUSCULAR | Status: DC
Start: 2023-10-16 — End: 2023-10-22
  Administered 2023-10-16 – 2023-10-22 (×19): 5000 [IU] via SUBCUTANEOUS
  Filled 2023-10-15 (×19): qty 1

## 2023-10-15 MED ORDER — LORAZEPAM 2 MG/ML IJ SOLN
1.0000 mg | Freq: Once | INTRAMUSCULAR | Status: AC
  Administered 2023-10-15: 1 mg via INTRAVENOUS
  Filled 2023-10-15: qty 1

## 2023-10-15 MED ORDER — SODIUM CHLORIDE 0.9 % IV SOLN
2.0000 g | Freq: Once | INTRAVENOUS | Status: AC
  Administered 2023-10-15: 2 g via INTRAVENOUS
  Filled 2023-10-15: qty 20

## 2023-10-15 MED ORDER — FAMOTIDINE IN NACL 20-0.9 MG/50ML-% IV SOLN: 20.0000 mg | Freq: Two times a day (BID) | INTRAVENOUS | Status: DC

## 2023-10-15 MED ORDER — ORAL CARE MOUTH RINSE
15.0000 mL | OROMUCOSAL | Status: DC
Start: 2023-10-16 — End: 2023-10-20
  Administered 2023-10-16 – 2023-10-20 (×48): 15 mL via OROMUCOSAL

## 2023-10-15 MED ORDER — ONDANSETRON HCL 4 MG/2ML IJ SOLN: 4.0000 mg | Freq: Four times a day (QID) | INTRAMUSCULAR | Status: DC | PRN

## 2023-10-15 MED ORDER — INSULIN ASPART 100 UNIT/ML IJ SOLN
0.0000 [IU] | INTRAMUSCULAR | Status: DC
  Administered 2023-10-17 (×2): 3 [IU] via SUBCUTANEOUS

## 2023-10-15 MED ORDER — CHLORHEXIDINE GLUCONATE CLOTH 2 % EX PADS
6.0000 | MEDICATED_PAD | Freq: Every day | CUTANEOUS | Status: DC
  Administered 2023-10-16 – 2023-10-25 (×11): 6 via TOPICAL

## 2023-10-15 NOTE — Progress Notes (Addendum)
 3:25pm: CSW spoke with MD who states peer to peer was done but an updated PT note is needed to prove patient has rehab potential.  Patient did not participate with therapy earlier per note.  CSW sent secure chat to Sari, Angie requesting an additional attempt be made to see patient.  2:35pm: CSW received call from [employer] rep who states a peer to peer has been offered. The deadline is tomorrow, 10/16/23 at 10am. The number to call is [phone], option 5.  CSW informed MD of request.  7:35am: Patient's insurance authorization remains pending at this time.  Niels Portugal, MSW, LCSW Transitions of Care  Clinical Social Worker II [phone]

## 2023-10-15 NOTE — Progress Notes (Addendum)
 RT NOTE: RT to room for scheduled ABG post initiation of mechanical ventilation. RT had PT on 100% FIO2- upon arrival RT found FiO2 at 40% and PT desating into low 70's. RT increased FiO2 back to 100%. ABG obtained. Sats now improved on 100% FiO2.

## 2023-10-15 NOTE — Progress Notes (Addendum)
 PT Cancellation Note  Patient Details Name: Gary [name] MRN: [id number] DOB: 09-Feb-1956   Cancelled Treatment:    Reason Eval/Treat Not Completed: Patient declined, no reason specified. Pt stating I feel like they are giving me the wrong medicine and I can't do anything. PT to continue attempts to mobilize.    [name] 10/15/2023, 9:33 AM

## 2023-10-15 NOTE — Progress Notes (Signed)
 RT NOTE: ABG obtained but when RT ran sample discovered it was mixed venous. ABG results will not cross over. Results were: pH 7.245/PCO2 101.4/PO2 registering less than 15 (due to mixed venous)/ and HCO3 43.9, Results given to MD and RN. RR increased to 28. RT to await further orders.

## 2023-10-15 NOTE — ED Notes (Signed)
 PT had removed trach collar from neck and this RN educated PT about importance of keeping trach collar on. PT verbalized understanding and his O2 went from 88% to 94%. PT's head was also repositioned on pillow.

## 2023-10-15 NOTE — ED Provider Notes (Signed)
 I was called to evaluate the patient due to hypoxia.  The patient had a steady decline throughout the day today and was not improving with blow-by oxygen .  The patient's pulse ox was in the 60s.  I did have our nursing staff moved the patient to the front of the emergency department.  He is covered with Rocephin  and Flagyl  for the diverticulitis that was diagnosed with a few days ago and repeat labs are ordered.  The patient does have an AKI.  His PCO2 is significantly elevated.  The patient is placed on the ventilator with improvement.  A call is placed to the ICU for admission.  CRITICAL CARE Performed by: Prentice JONELLE Medicus   Total critical care time: 40 minutes  Critical care time was exclusive of separately billable procedures and treating other patients.  Critical care was necessary to treat or prevent imminent or life-threatening deterioration.  Critical care was time spent personally by me on the following activities: development of treatment plan with patient and/or surrogate as well as nursing, discussions with consultants, evaluation of patient's response to treatment, examination of patient, obtaining history from patient or surrogate, ordering and performing treatments and interventions, ordering and review of laboratory studies, ordering and review of radiographic studies, pulse oximetry and re-evaluation of patient's condition.    Medicus Prentice JONELLE, MD 10/15/23 2140

## 2023-10-15 NOTE — ED Provider Notes (Signed)
 Emergency Medicine Observation Re-evaluation Note  Gary [name] is a 68 y.o. male, seen on rounds today.  Pt initially presented to the ED for complaints of Diarrhea Currently, the patient is asleep.  Physical Exam  BP 139/84   Pulse 89   Temp 98.2 F (36.8 C) (Axillary)   Resp 20   Ht 6' 4 (1.93 m)   Wt (!) 176.9 kg   SpO2 97%   BMI 47.47 kg/m  Physical Exam General: No acute distress Cardiac: Normal rate Lungs: Normal effort   ED Course / MDM  EKG:   I have reviewed the labs performed to date as well as medications administered while in observation.  Recent changes in the last 24 hours include pending insurance approval.  Plan  Current plan is for placement, no clear disposition at this time.SABRA Mannie Pac T, DO 10/15/23 [phone]
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
## 2023-10-15 NOTE — ED Notes (Signed)
 Pt suctioned with thick moderate suctions. Pt's sats improved with suctioning

## 2023-10-15 NOTE — ED Notes (Signed)
 PT pulled his inner canula out and was educated about the importance of not doing so. RT was called, replaced it and is bedside suctioning PT. PT's O2 stats are 97% at this time.

## 2023-10-15 NOTE — Discharge Planning (Signed)
 RNCM consulted regarding pt home settings for CPAP machine.  RNCM reached out to [employer] as was told pt settings were 10 over 20.  Information relayed to team.  Anjeli Casad J. Debarah, BSN, RN, NCM  Transitions of Care  Nurse Case Manager  [facility] Emergency Departments  Operative Services  [phone]

## 2023-10-16 DIAGNOSIS — R4182 Altered mental status, unspecified: Secondary | ICD-10-CM | POA: Diagnosis not present

## 2023-10-16 DIAGNOSIS — N179 Acute kidney failure, unspecified: Secondary | ICD-10-CM | POA: Diagnosis not present

## 2023-10-16 DIAGNOSIS — J9602 Acute respiratory failure with hypercapnia: Secondary | ICD-10-CM | POA: Diagnosis not present

## 2023-10-16 DIAGNOSIS — K5792 Diverticulitis of intestine, part unspecified, without perforation or abscess without bleeding: Secondary | ICD-10-CM

## 2023-10-16 LAB — HIV ANTIBODY (ROUTINE TESTING W REFLEX): HIV Screen 4th Generation wRfx: NONREACTIVE

## 2023-10-16 LAB — BLOOD CULTURE ID PANEL (REFLEXED) - BCID2
A.calcoaceticus-baumannii: NOT DETECTED
Bacteroides fragilis: NOT DETECTED
CTX-M ESBL: NOT DETECTED
Candida albicans: NOT DETECTED
Candida auris: NOT DETECTED
Candida glabrata: NOT DETECTED
Candida krusei: NOT DETECTED
Candida parapsilosis: NOT DETECTED
Candida tropicalis: NOT DETECTED
Carbapenem resistance IMP: NOT DETECTED
Carbapenem resistance KPC: NOT DETECTED
Carbapenem resistance NDM: NOT DETECTED
Carbapenem resistance VIM: NOT DETECTED
Cryptococcus neoformans/gattii: NOT DETECTED
Enterobacter cloacae complex: NOT DETECTED
Enterobacterales: NOT DETECTED
Enterococcus Faecium: NOT DETECTED
Enterococcus faecalis: NOT DETECTED
Escherichia coli: NOT DETECTED
Haemophilus influenzae: NOT DETECTED
Klebsiella aerogenes: NOT DETECTED
Klebsiella oxytoca: NOT DETECTED
Klebsiella pneumoniae: NOT DETECTED
Listeria monocytogenes: NOT DETECTED
Neisseria meningitidis: NOT DETECTED
Proteus species: NOT DETECTED
Pseudomonas aeruginosa: DETECTED — AB
Salmonella species: NOT DETECTED
Serratia marcescens: NOT DETECTED
Staphylococcus aureus (BCID): NOT DETECTED
Staphylococcus epidermidis: NOT DETECTED
Staphylococcus lugdunensis: NOT DETECTED
Staphylococcus species: NOT DETECTED
Stenotrophomonas maltophilia: NOT DETECTED
Streptococcus agalactiae: NOT DETECTED
Streptococcus pneumoniae: NOT DETECTED
Streptococcus pyogenes: NOT DETECTED
Streptococcus species: NOT DETECTED

## 2023-10-16 LAB — CBC
HCT: 36.5 % — ABNORMAL LOW (ref 39.0–52.0)
HCT: 33.6 % — ABNORMAL LOW (ref 39.0–52.0)
Hemoglobin: 9 g/dL — ABNORMAL LOW (ref 13.0–17.0)
Hemoglobin: 8.4 g/dL — ABNORMAL LOW (ref 13.0–17.0)
MCH: 19.6 pg — ABNORMAL LOW (ref 26.0–34.0)
MCH: 19.5 pg — ABNORMAL LOW (ref 26.0–34.0)
MCHC: 24.7 g/dL — ABNORMAL LOW (ref 30.0–36.0)
MCHC: 25 g/dL — ABNORMAL LOW (ref 30.0–36.0)
MCV: 79.3 fL — ABNORMAL LOW (ref 80.0–100.0)
MCV: 78.1 fL — ABNORMAL LOW (ref 80.0–100.0)
Platelets: 175 10*3/uL (ref 150–400)
Platelets: 176 10*3/uL (ref 150–400)
RBC: 4.6 MIL/uL (ref 4.22–5.81)
RBC: 4.3 MIL/uL (ref 4.22–5.81)
RDW: 19.2 % — ABNORMAL HIGH (ref 11.5–15.5)
RDW: 19.1 % — ABNORMAL HIGH (ref 11.5–15.5)
WBC: 8.5 10*3/uL (ref 4.0–10.5)
WBC: 9.5 10*3/uL (ref 4.0–10.5)
nRBC: 4.8 % — ABNORMAL HIGH (ref 0.0–0.2)
nRBC: 2.7 % — ABNORMAL HIGH (ref 0.0–0.2)

## 2023-10-16 LAB — BLOOD GAS, ARTERIAL
Acid-Base Excess: 18.6 mmol/L — ABNORMAL HIGH (ref 0.0–2.0)
Bicarbonate: 40.4 mmol/L — ABNORMAL HIGH (ref 20.0–28.0)
Drawn by: 38235
O2 Saturation: 99.6 %
Patient temperature: 38.2
pCO2 arterial: 37 mmHg (ref 32–48)
pH, Arterial: 7.65 (ref 7.35–7.45)
pO2, Arterial: 172 mmHg — ABNORMAL HIGH (ref 83–108)

## 2023-10-16 LAB — BASIC METABOLIC PANEL WITH GFR
Anion gap: 13 (ref 5–15)
BUN: 27 mg/dL — ABNORMAL HIGH (ref 8–23)
CO2: 32 mmol/L (ref 22–32)
Calcium: 7.7 mg/dL — ABNORMAL LOW (ref 8.9–10.3)
Chloride: 94 mmol/L — ABNORMAL LOW (ref 98–111)
Creatinine, Ser: 1.93 mg/dL — ABNORMAL HIGH (ref 0.61–1.24)
GFR, Estimated: 38 mL/min — ABNORMAL LOW (ref >60)
Glucose, Bld: 76 mg/dL (ref 70–99)
Potassium: 3.6 mmol/L (ref 3.5–5.1)
Sodium: 139 mmol/L (ref 135–145)

## 2023-10-16 LAB — GLUCOSE, CAPILLARY
Glucose-Capillary: 106 mg/dL — ABNORMAL HIGH (ref 70–99)
Glucose-Capillary: 106 mg/dL — ABNORMAL HIGH (ref 70–99)
Glucose-Capillary: 76 mg/dL (ref 70–99)
Glucose-Capillary: 84 mg/dL (ref 70–99)
Glucose-Capillary: 87 mg/dL (ref 70–99)
Glucose-Capillary: 92 mg/dL (ref 70–99)

## 2023-10-16 LAB — HEMOGLOBIN A1C
Hgb A1c MFr Bld: 5 % (ref 4.8–5.6)
Mean Plasma Glucose: 96.8 mg/dL

## 2023-10-16 LAB — MAGNESIUM: Magnesium: 1.8 mg/dL (ref 1.7–2.4)

## 2023-10-16 LAB — PHOSPHORUS
Phosphorus: <1 mg/dL — CL (ref 2.5–4.6)
Phosphorus: 1.3 mg/dL — ABNORMAL LOW (ref 2.5–4.6)

## 2023-10-16 LAB — CREATININE, SERUM
Creatinine, Ser: 1.77 mg/dL — ABNORMAL HIGH (ref 0.61–1.24)
GFR, Estimated: 42 mL/min — ABNORMAL LOW (ref >60)

## 2023-10-16 LAB — MRSA NEXT GEN BY PCR, NASAL: MRSA by PCR Next Gen: NOT DETECTED

## 2023-10-16 LAB — TRIGLYCERIDES: Triglycerides: 152 mg/dL — ABNORMAL HIGH (ref <150)

## 2023-10-16 MED ORDER — BUDESONIDE 0.25 MG/2ML IN SUSP
0.2500 mg | Freq: Two times a day (BID) | RESPIRATORY_TRACT | Status: DC
  Administered 2023-10-16 – 2023-10-26 (×21): 0.25 mg via RESPIRATORY_TRACT
  Filled 2023-10-16 (×22): qty 2

## 2023-10-16 MED ORDER — HALOPERIDOL LACTATE 5 MG/ML IJ SOLN: 5.0000 mg | Freq: Four times a day (QID) | INTRAMUSCULAR | Status: DC | PRN

## 2023-10-16 MED ORDER — FENTANYL CITRATE PF 50 MCG/ML IJ SOSY
50.0000 ug | PREFILLED_SYRINGE | INTRAMUSCULAR | Status: DC | PRN
  Administered 2023-10-16 – 2023-10-17 (×4): 50 ug via INTRAVENOUS
  Filled 2023-10-16 (×4): qty 1

## 2023-10-16 MED ORDER — POTASSIUM PHOSPHATES 15 MMOLE/5ML IV SOLN
30.0000 mmol | Freq: Once | INTRAVENOUS | Status: AC
  Administered 2023-10-16: 30 mmol via INTRAVENOUS
  Filled 2023-10-16: qty 10

## 2023-10-16 MED ORDER — QUETIAPINE FUMARATE 25 MG PO TABS
25.0000 mg | ORAL_TABLET | Freq: Every day | ORAL | Status: DC
  Administered 2023-10-16: 25 mg
  Filled 2023-10-16: qty 1

## 2023-10-16 MED ORDER — SODIUM CHLORIDE 0.9 % IV SOLN
2.0000 g | Freq: Three times a day (TID) | INTRAVENOUS | Status: DC
  Administered 2023-10-16 – 2023-10-21 (×14): 2 g via INTRAVENOUS
  Filled 2023-10-16 (×19): qty 40

## 2023-10-16 MED ORDER — MAGNESIUM SULFATE 2 GM/50ML IV SOLN
2.0000 g | Freq: Once | INTRAVENOUS | Status: AC
  Administered 2023-10-16: 2 g via INTRAVENOUS
  Filled 2023-10-16: qty 50

## 2023-10-16 MED ORDER — ARFORMOTEROL TARTRATE 15 MCG/2ML IN NEBU
15.0000 ug | INHALATION_SOLUTION | Freq: Two times a day (BID) | RESPIRATORY_TRACT | Status: DC
  Administered 2023-10-16 – 2023-10-26 (×21): 15 ug via RESPIRATORY_TRACT
  Filled 2023-10-16 (×21): qty 2

## 2023-10-16 MED ORDER — SODIUM CHLORIDE 0.9 % IV SOLN
2.0000 g | Freq: Three times a day (TID) | INTRAVENOUS | Status: DC
  Administered 2023-10-16: 2 g via INTRAVENOUS
  Filled 2023-10-16: qty 12.5

## 2023-10-16 MED ORDER — ACETAMINOPHEN 10 MG/ML IV SOLN
1000.0000 mg | Freq: Once | INTRAVENOUS | Status: AC
  Administered 2023-10-16: 1000 mg via INTRAVENOUS
  Filled 2023-10-16: qty 100

## 2023-10-16 MED ORDER — DEXMEDETOMIDINE HCL IN NACL 400 MCG/100ML IV SOLN
0.0000 ug/kg/h | INTRAVENOUS | Status: DC
  Administered 2023-10-16: 0.4 ug/kg/h via INTRAVENOUS
  Administered 2023-10-16: 0.5 ug/kg/h via INTRAVENOUS
  Administered 2023-10-16: 0.7 ug/kg/h via INTRAVENOUS
  Administered 2023-10-17: 0.5 ug/kg/h via INTRAVENOUS
  Administered 2023-10-17 (×2): 0.6 ug/kg/h via INTRAVENOUS
  Filled 2023-10-16 (×7): qty 100

## 2023-10-16 MED ORDER — SODIUM CHLORIDE 0.9 % IV SOLN: 2.0000 g | INTRAVENOUS | Status: DC

## 2023-10-16 MED ORDER — METRONIDAZOLE 500 MG/100ML IV SOLN
500.0000 mg | Freq: Two times a day (BID) | INTRAVENOUS | Status: DC
  Administered 2023-10-16 (×2): 500 mg via INTRAVENOUS
  Filled 2023-10-16 (×2): qty 100

## 2023-10-16 NOTE — Progress Notes (Signed)
 RT NOTE: RT transported PT on ventilator from ED to CT and then from CT to room 3M02 with no complications. RT on unit given report.

## 2023-10-16 NOTE — Progress Notes (Addendum)
 PHARMACY - PHYSICIAN COMMUNICATION CRITICAL VALUE ALERT - BLOOD CULTURE IDENTIFICATION (BCID)  Gary [name] is an 68 y.o. male who presented to [facility] on 10/11/2023 with a chief complaint of respiratory failure now with bacteremia.   Assessment:  3/4 blood cultures + for pseudomonas aeruginosa   Name of physician (or Provider) Contacted: Harlene Na  Current antibiotics: Ceftriaxone  / Flagyl    Changes to prescribed antibiotics recommended:  Recommendations accepted by provider. Change ceftriaxone  to meropenem  2 g q8h extended infusion given history of pseudomonas R to cefepime  / zosyn .   No results found for this or any previous visit.  [name] 10/16/2023  7:20 PM
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
## 2023-10-16 NOTE — Procedures (Signed)
 Cortrak  Person Inserting Tube:  Mahala Menghini, RD Tube Type:  Cortrak - 43 inches Tube Size:  10 Tube Location:  Right nare Initial Placement:  Stomach Secured by: Bridle Technique Used to Measure Tube Placement:  Marking at nare/corner of mouth Cortrak Secured At:  69 cm   Cortrak Tube Team Note:  Consult received to place a Cortrak feeding tube.   No x-ray is required. RN may begin using tube.   If the tube becomes dislodged please keep the tube and contact the Cortrak team at [email or web address] for replacement.  If after hours and replacement cannot be delayed, place a NG tube and confirm placement with an abdominal x-ray.    Mertie Clause, MS, RD, LDN Registered Dietitian II Please see AMiON for contact information.

## 2023-10-16 NOTE — Progress Notes (Signed)
 PT Cancellation Note  Patient Details Name: Gary [name] MRN: [id number] DOB: 1955-06-19   Cancelled Treatment:    Reason Eval/Treat Not Completed: (P) Patient's level of consciousness Pt still sedated. RN reports she is gradually weaning him but suggest PT come back tomorrow.   Laban Orourke B. [name] PT, DPT Acute Rehabilitation Services Please use secure chat or  Call Office [phone]   Gary [name] Fleeta Fleet 10/16/2023, 10:30 AM

## 2023-10-16 NOTE — Progress Notes (Signed)
 Adjusted patient's ventilator settings per ABG results. RR decreased to 18 bpm, FiO2 decreased from 100% to 80%, Peep decreased from +10 to +8.

## 2023-10-16 NOTE — Discharge Planning (Signed)
 RNCM notified from DME company that home CPAP settings were 10 over 20.  Kennette Cuthrell J. Debarah, BSN, RN, NCM  Transitions of Care  Nurse Case Manager  [facility]  Operative Services  [phone]

## 2023-10-16 NOTE — Progress Notes (Signed)
 Initial Nutrition Assessment  DOCUMENTATION CODES:   Morbid obesity  INTERVENTION:  Hold on tube feeding initiation today given morning Phosphorus <1.0 Recommend rechecking lab this evening now s/p repletion completed  Once appropriate for tube feeding, recommend initiation of TF via Cortrak: Start Vital High Protein at 33ml/hr and advance by 10ml q8h to goal rate of 21ml/hr ( per day) Provides 2160 kcal, 188g protein, free water  daily  Monitor magnesium , potassium, and phosphorus daily for at least 3 days, MD to replete as needed, as pt is at risk for refeeding syndrome given NPO x5 days. Add Thiamine 100 mg daily for 7 days  NUTRITION DIAGNOSIS:  Inadequate oral intake related to acute illness as evidenced by NPO status  GOAL:  Patient will meet greater than or equal to 90% of their needs  MONITOR:  Vent status, Labs, Weight trends, I & O's  REASON FOR ASSESSMENT:  Consult Enteral/tube feeding initiation and management  ASSESSMENT:  Pt presented to the ED on 6/27 with abdominal pain and diarrhea, found to have diverticulitis and admitted for vent management d/t hypercapnia. PMH significant for HFpEF, AAA, COPD, HTN, anemia, chronic trach.  7/1 transferred from ED to ICU on vent support 7/2 cortrak placed  Patient is currently intubated on ventilator support MV: 15.5 L/min Temp (24hrs), Avg:100.4 F (38 C), Min:98.3 F (36.8 C), Max:102.7 F (39.3 C)  Attempted to check in with patient at bedside.  Minimally interactive at time of RD visit.  Remains on vent.   Spoke with CCM NP who reports plan to try to wean to trach collar.  Noted RT trialed on trach collar 50% but did not tolerate and transitioned back to full vent support.   Pt no longer has PEG tube. Per report, he was eating at home.  Unable to obtain more detailed nutrition related history this date. Will attempt to gather once pt off vent and as able to gather from family.   Suspect admit  weight to be stated/pulled from prior documentation.  Current measured weight noted to be 193.2 kg  Medications: SSI 0-20 units q4h, torsemide  discontinued d/t AKI Drips: IV abx magnesium  sulfate (once) Potassium phosphate  (once) Propofol  stopped  Labs:  BUN 27 Cr 1.93 Phosphorus <1.0 (repletion ordered) GFR 38 Triglyceride 152 CBG 76-84 x8 hours  NUTRITION - FOCUSED PHYSICAL EXAM: Assessment performed. Presence of adipose tissue likely masking any possible muscle deficits.   Diet Order:   Diet Order             Diet NPO time specified  Diet effective now                   EDUCATION NEEDS:   No education needs have been identified at this time  Skin:  Skin Assessment: Reviewed RN Assessment  Last BM:  7/2 type 7 x2  Height:   Ht Readings from Last 1 Encounters:  10/11/23 6' 4 (1.93 m)    Weight:   Wt Readings from Last 1 Encounters:  10/16/23 (!) 193.2 kg    Ideal Body Weight:  91.8 kg  BMI:  Body mass index is 51.85 kg/m.  Estimated Nutritional Needs:   Kcal:  2200-2400  Protein:  180g  Fluid:  >/=2L  Royce Maris, RDN, LDN Clinical Nutrition See AMiON for contact information.

## 2023-10-16 NOTE — Plan of Care (Signed)
  Problem: Safety: Goal: Non-violent Restraint(s) Outcome: Progressing   Problem: Education: Goal: Ability to describe self-care measures that may prevent or decrease complications (Diabetes Survival Skills Education) will improve Outcome: Progressing   Problem: Fluid Volume: Goal: Ability to maintain a balanced intake and output will improve Outcome: Progressing   Problem: Education: Goal: Knowledge of General Education information will improve Description: Including pain rating scale, medication(s)/side effects and non-pharmacologic comfort measures Outcome: Progressing   Problem: Health Behavior/Discharge Planning: Goal: Ability to manage health-related needs will improve Outcome: Progressing   Problem: Clinical Measurements: Goal: Ability to maintain clinical measurements within normal limits will improve Outcome: Progressing

## 2023-10-16 NOTE — Progress Notes (Addendum)
 eLink Physician-Brief Progress Note Patient Name: KIRE FERG DOB: 09/15/1955 MRN: [id number]   Date of Service  10/16/2023  HPI/Events of Note  68 y/o morbidly obese male who has a h/o HFpEF, AAA, COPD, HTN, Anemia, chronic trach (Shiley XLT #7) who presented to the ED on 6/27 with abdominal pain and diarrhea found to have diverticulitis acute hypercapnic respiratory failure and acute kidney injury  Patient is febrile, tachypneic, tachycardic and saturating 100% with 100% FiO2.  Results show respiratory acidosis, chronic metabolic alkalemia, elevated creatinine, leukocytosis and normocytic anemia.  eICU Interventions  Continue IV antibiotics  Mechanical ventilation, daily spontaneous awakening/breathing trials  DVT prophylaxis heparin  GI prophylaxis famotidine    0112 - tylenol  IV x1  0511 -add fentanyl  as needed for CPOT goals  0537 - ABG reviewed, agree with reduced RR and Peep, keep titrating FiO2 to maintain SpO2 >90  0655 - Kphos and mg sulfate  Intervention Category Evaluation Type: New Patient Evaluation  Macario Shear 10/16/2023, 12:36 AM

## 2023-10-16 NOTE — Progress Notes (Addendum)
 Pt has not spontaneously voided since admission to the ICU. Pt bladder scanned at 0915 for 366cc and straight cath returned 600cc. Bladder scanned again at 1600 for 239cc. Pt originally nodding yes to foley catheter placement, but after prepping site pt broke sterile field and adamantly shaking head and mouthing no no no no. Pt educated on necessity of foley catheter insertion, but still declines. Dr Claudene made aware. Continue to bladder scan.

## 2023-10-16 NOTE — Progress Notes (Signed)
 CSW has cancelled patient's pending authorization for [facility]. A new authorization will need to be submitted after patient is seen by therapy again.  Niels Portugal, MSW, LCSW Transitions of Care  Clinical Social Worker II [phone]

## 2023-10-16 NOTE — Progress Notes (Signed)
 eLink Physician-Brief Progress Note Patient Name: EFREM PITSTICK DOB: [birth year] MRN: [id number]   Date of Service  10/16/2023  HPI/Events of Note  68 y/o morbidly obese male who has a h/o HFpEF, AAA, COPD, HTN, Anemia, chronic trach (Shiley XLT #7) who presented to the ED on 6/27 with abdominal pain and diarrhea found to have diverticulitis acute hypercapnic respiratory failure and acute kidney injury   Agitated despite maximal Precedex , starting propofol   eICU Interventions  Wrist restraints     Intervention Category Minor Interventions: Agitation / anxiety - evaluation and management  Sharronda Schweers 10/16/2023, 11:42 PM

## 2023-10-16 NOTE — Progress Notes (Signed)
 Pt was trialed on trache collar 50% but did not tolerate. Pt stated he can't breathe,  RT placed back on ventilator full support. CCM aware

## 2023-10-17 ENCOUNTER — Inpatient Hospital Stay (HOSPITAL_COMMUNITY)

## 2023-10-17 ENCOUNTER — Encounter (HOSPITAL_COMMUNITY)

## 2023-10-17 DIAGNOSIS — J9601 Acute respiratory failure with hypoxia: Secondary | ICD-10-CM | POA: Diagnosis not present

## 2023-10-17 DIAGNOSIS — N179 Acute kidney failure, unspecified: Secondary | ICD-10-CM | POA: Diagnosis not present

## 2023-10-17 DIAGNOSIS — I5030 Unspecified diastolic (congestive) heart failure: Secondary | ICD-10-CM | POA: Diagnosis not present

## 2023-10-17 DIAGNOSIS — A4152 Sepsis due to Pseudomonas: Secondary | ICD-10-CM | POA: Diagnosis not present

## 2023-10-17 DIAGNOSIS — J9602 Acute respiratory failure with hypercapnia: Secondary | ICD-10-CM | POA: Diagnosis not present

## 2023-10-17 DIAGNOSIS — K5792 Diverticulitis of intestine, part unspecified, without perforation or abscess without bleeding: Secondary | ICD-10-CM | POA: Diagnosis not present

## 2023-10-17 DIAGNOSIS — J9621 Acute and chronic respiratory failure with hypoxia: Secondary | ICD-10-CM | POA: Diagnosis not present

## 2023-10-17 DIAGNOSIS — Z8616 Personal history of COVID-19: Secondary | ICD-10-CM | POA: Diagnosis not present

## 2023-10-17 DIAGNOSIS — R4182 Altered mental status, unspecified: Secondary | ICD-10-CM | POA: Diagnosis not present

## 2023-10-17 DIAGNOSIS — Z1152 Encounter for screening for COVID-19: Secondary | ICD-10-CM | POA: Diagnosis not present

## 2023-10-17 LAB — CBC
HCT: 34.8 % — ABNORMAL LOW (ref 39.0–52.0)
Hemoglobin: 9.2 g/dL — ABNORMAL LOW (ref 13.0–17.0)
MCH: 19.5 pg — ABNORMAL LOW (ref 26.0–34.0)
MCHC: 26.4 g/dL — ABNORMAL LOW (ref 30.0–36.0)
MCV: 73.7 fL — ABNORMAL LOW (ref 80.0–100.0)
Platelets: 188 10*3/uL (ref 150–400)
RBC: 4.72 MIL/uL (ref 4.22–5.81)
RDW: 19.6 % — ABNORMAL HIGH (ref 11.5–15.5)
WBC: 7.4 10*3/uL (ref 4.0–10.5)
nRBC: 0.4 % — ABNORMAL HIGH (ref 0.0–0.2)

## 2023-10-17 LAB — ECHOCARDIOGRAM COMPLETE
Area-P 1/2: 3.7 cm2
Calc EF: 46.8 %
Height: 76 in
S' Lateral: 5.2 cm
Single Plane A2C EF: 50 %
Single Plane A4C EF: 41 %
Weight: 6843.08 [oz_av]

## 2023-10-17 LAB — BASIC METABOLIC PANEL WITH GFR
Anion gap: 14 (ref 5–15)
BUN: 29 mg/dL — ABNORMAL HIGH (ref 8–23)
CO2: 30 mmol/L (ref 22–32)
Calcium: 8.4 mg/dL — ABNORMAL LOW (ref 8.9–10.3)
Chloride: 97 mmol/L — ABNORMAL LOW (ref 98–111)
Creatinine, Ser: 1.74 mg/dL — ABNORMAL HIGH (ref 0.61–1.24)
GFR, Estimated: 43 mL/min — ABNORMAL LOW (ref >60)
Glucose, Bld: 108 mg/dL — ABNORMAL HIGH (ref 70–99)
Potassium: 3.2 mmol/L — ABNORMAL LOW (ref 3.5–5.1)
Sodium: 141 mmol/L (ref 135–145)

## 2023-10-17 LAB — URINALYSIS, ROUTINE W REFLEX MICROSCOPIC
Bilirubin Urine: NEGATIVE
Glucose, UA: NEGATIVE mg/dL
Hgb urine dipstick: NEGATIVE
Ketones, ur: NEGATIVE mg/dL
Leukocytes,Ua: NEGATIVE
Nitrite: NEGATIVE
Protein, ur: NEGATIVE mg/dL
Specific Gravity, Urine: 1.01 (ref 1.005–1.030)
pH: 8 (ref 5.0–8.0)

## 2023-10-17 LAB — MAGNESIUM: Magnesium: 2.4 mg/dL (ref 1.7–2.4)

## 2023-10-17 LAB — GLUCOSE, CAPILLARY
Glucose-Capillary: 102 mg/dL — ABNORMAL HIGH (ref 70–99)
Glucose-Capillary: 105 mg/dL — ABNORMAL HIGH (ref 70–99)
Glucose-Capillary: 120 mg/dL — ABNORMAL HIGH (ref 70–99)
Glucose-Capillary: 121 mg/dL — ABNORMAL HIGH (ref 70–99)
Glucose-Capillary: 129 mg/dL — ABNORMAL HIGH (ref 70–99)
Glucose-Capillary: 82 mg/dL (ref 70–99)
Glucose-Capillary: 97 mg/dL (ref 70–99)

## 2023-10-17 LAB — BRAIN NATRIURETIC PEPTIDE: B Natriuretic Peptide: 165.2 pg/mL — ABNORMAL HIGH (ref 0.0–100.0)

## 2023-10-17 LAB — PHOSPHORUS: Phosphorus: 2.1 mg/dL — ABNORMAL LOW (ref 2.5–4.6)

## 2023-10-17 MED ORDER — QUETIAPINE FUMARATE 50 MG PO TABS
50.0000 mg | ORAL_TABLET | Freq: Two times a day (BID) | ORAL | Status: DC
  Administered 2023-10-17: 50 mg
  Filled 2023-10-17: qty 1

## 2023-10-17 MED ORDER — MIDAZOLAM HCL 2 MG/2ML IJ SOLN
2.0000 mg | Freq: Once | INTRAMUSCULAR | Status: AC
  Filled 2023-10-17: qty 2

## 2023-10-17 MED ORDER — FENTANYL CITRATE PF 50 MCG/ML IJ SOSY
100.0000 ug | PREFILLED_SYRINGE | Freq: Once | INTRAMUSCULAR | Status: AC
  Administered 2023-10-17: 100 ug via INTRAVENOUS
  Filled 2023-10-17: qty 2

## 2023-10-17 MED ORDER — ETOMIDATE 2 MG/ML IV SOLN
20.0000 mg | Freq: Once | INTRAVENOUS | Status: AC
  Filled 2023-10-17: qty 10

## 2023-10-17 MED ORDER — THIAMINE MONONITRATE 100 MG PO TABS
100.0000 mg | ORAL_TABLET | Freq: Every day | ORAL | Status: DC
  Administered 2023-10-17 – 2023-10-20 (×4): 100 mg
  Filled 2023-10-17 (×4): qty 1

## 2023-10-17 MED ORDER — HYDRALAZINE HCL 20 MG/ML IJ SOLN: 10.0000 mg | Freq: Four times a day (QID) | INTRAMUSCULAR | Status: DC | PRN

## 2023-10-17 MED ORDER — VITAL HP 1.0 CAL PO LIQD
1000.0000 mL | ORAL | Status: DC
  Administered 2023-10-17 – 2023-10-20 (×4): 1000 mL
  Filled 2023-10-17 (×2): qty 1000

## 2023-10-17 MED ORDER — PROPOFOL 1000 MG/100ML IV EMUL
0.0000 ug/kg/min | INTRAVENOUS | Status: DC
  Administered 2023-10-17: 40 ug/kg/min via INTRAVENOUS
  Administered 2023-10-17: 25 ug/kg/min via INTRAVENOUS
  Administered 2023-10-17: 20 ug/kg/min via INTRAVENOUS
  Administered 2023-10-17 (×2): 25 ug/kg/min via INTRAVENOUS
  Administered 2023-10-18: 10 ug/kg/min via INTRAVENOUS
  Administered 2023-10-18 (×3): 25 ug/kg/min via INTRAVENOUS
  Filled 2023-10-17: qty 100
  Filled 2023-10-17: qty 200
  Filled 2023-10-17 (×6): qty 100

## 2023-10-17 MED ORDER — POTASSIUM PHOSPHATES 15 MMOLE/5ML IV SOLN
30.0000 mmol | Freq: Once | INTRAVENOUS | Status: AC
  Administered 2023-10-17: 30 mmol via INTRAVENOUS
  Filled 2023-10-17: qty 10

## 2023-10-17 MED ORDER — HALOPERIDOL LACTATE 5 MG/ML IJ SOLN
5.0000 mg | Freq: Four times a day (QID) | INTRAMUSCULAR | Status: DC | PRN
  Administered 2023-10-17: 5 mg via INTRAVENOUS
  Filled 2023-10-17: qty 1

## 2023-10-17 MED ORDER — PERFLUTREN LIPID MICROSPHERE
1.0000 mL | INTRAVENOUS | Status: AC | PRN
  Administered 2023-10-17: 5 mL via INTRAVENOUS

## 2023-10-17 NOTE — Evaluation (Signed)
 Physical Therapy Re-Evaluation Patient Details Name: Gary [name] MRN: [id number] DOB: [birth year] Today's Date: 10/17/2023  History of Present Illness  Patient is 68 y.o. male presenting from home to the ED on 10/11/23 with SoB and abdominal cramping and diarrhea. Treated for with antibiotics for diverticulitis. 7/1 went into acute hypercapnic respiratory failure and was moved to ICU and placed on ventilator for treatment of respiratory failure and metabolic encephalopathy PMH significant for HFpEF (60-65%), AAA, CKD IIIa, COPD, HTN, morbid obesity, gastric ulcers, Tracheostomy in 2023.  Clinical Impression  PT on ventilator on entry, hopeful to be weaned off today. Pt upset that bed linens had not been changed. Therapy recommended rolling for linen change. Pt agreeable and able to lift LE for removal of SCDs. Educated pt on needing to lower the HoB to facilitate. VSS. Pt with increased anxiety with lower bed and begins to pull at his vent lines. At same time pt pulse ox looses waveform and reads in 50s which heightens anxiety. RN alerted of pt desire to have MD come. Oxygen  probe replaced and SpO2 98%O2. MD in room at end of session. Patient will benefit from continued inpatient follow up therapy, <3 hours/day at [facility] facility. PT will continue to see acutely to progress mobility.  PT will continue to see pt acutely to progress mobility.        If plan is discharge home, recommend the following: Two people to help with walking and/or transfers;Two people to help with bathing/dressing/bathroom;Assistance with cooking/housework;Assist for transportation;Help with stairs or ramp for entrance   Can travel by private vehicle   No    Equipment Recommendations None recommended by PT (defer to next venue)     Functional Status Assessment Patient has had a recent decline in their functional status and demonstrates the ability to make significant improvements in function in a reasonable and predictable  amount of time.     Precautions / Restrictions Precautions Precautions: Fall Recall of Precautions/Restrictions: Intact Precaution/Restrictions Comments: trach with PMV Restrictions Weight Bearing Restrictions Per Provider Order: No          Pertinent Vitals/Pain Pain Assessment Pain Assessment: Faces Faces Pain Scale: Hurts a little bit Pain Location: all over Pain Descriptors / Indicators: Aching, Discomfort Pain Intervention(s): Limited activity within patient's tolerance, Monitored during session    Home Living Family/patient expects to be discharged to:: Private residence Living Arrangements: Other relatives Available Help at Discharge: Available PRN/intermittently;Family;Personal care attendant Type of Home: Apartment Home Access: Level entry;Ramped entrance       Home Layout: One level Home Equipment: Wheelchair - Nurse, children's (2 wheels);Hospital bed Additional Comments: patient has a daily aide for ~ 3 hours a day 7x/week (aides) had HH 1x/wk for ~ 3wks    Prior Function Prior Level of Function : Needs assist             Mobility Comments: short distance ambulation with rolling walker ADLs Comments: wash up in bed with assist of Aides,  *reports he has a bench but hasn't tried it*     Extremity/Trunk Assessment   Upper Extremity Assessment Upper Extremity Assessment: Defer to OT evaluation    Lower Extremity Assessment RLE Deficits / Details: able to lift LE for removal of SCDs LLE Deficits / Details: able to lift LE for removal of SCDs    Cervical / Trunk Assessment Cervical / Trunk Assessment: Other exceptions Cervical / Trunk Exceptions: habitus  Communication   Communication Communication: No apparent difficulties;Impaired Factors Affecting Communication: Trach/intubated;Other (comment) (  talks over vent and refuses to write anything down, becomes upset when no one can read his lips)    Cognition Arousal: Alert Behavior  During Therapy: Anxious (highly anxious of being on the vent and not getting enough oxygen )   PT - Cognitive impairments: No apparent impairments, Problem solving, Safety/Judgement                       PT - Cognition Comments: pt very anxious about SoB with being lais flat for requested change of bed linens Following commands: Intact       Cueing Cueing Techniques: Verbal cues, Gestural cues, Visual cues     General Comments General comments (skin integrity, edema, etc.): pt with extreme anxiety with being laid flat, ear O2 probe with poor pleth waveform and in 60% causing increased anxiety PT/OT ask for stillness to be able to readjust pulse ox probe, SpO2 on vent 98%O2, when good pleth returned    Exercises Other Exercises Other Exercises: LE leg lifts   Assessment/Plan    PT Assessment Patient needs continued PT services  PT Problem List Decreased strength;Decreased activity tolerance;Decreased balance;Decreased mobility;Cardiopulmonary status limiting activity;Obesity       PT Treatment Interventions DME instruction;Gait training;Stair training;Functional mobility training;Therapeutic activities;Therapeutic exercise;Balance training;Patient/family education;Wheelchair mobility training    PT Goals (Current goals can be found in the Care Plan section)  Acute Rehab PT Goals Patient Stated Goal: get walking PT Goal Formulation: With patient Time For Goal Achievement: 10/27/23 Potential to Achieve Goals: Good    Frequency Min 1X/week        AM-PAC PT 6 Clicks Mobility  Outcome Measure Help needed turning from your back to your side while in a flat bed without using bedrails?: Total Help needed moving from lying on your back to sitting on the side of a flat bed without using bedrails?: Total Help needed moving to and from a bed to a chair (including a wheelchair)?: Total Help needed standing up from a chair using your arms (e.g., wheelchair or bedside chair)?:  Total Help needed to walk in hospital room?: Total Help needed climbing 3-5 steps with a railing? : Total 6 Click Score: 6    End of Session Equipment Utilized During Treatment: Oxygen  (on ventilator) Activity Tolerance: Patient tolerated treatment well Patient left: in bed;with call bell/phone within reach Nurse Communication: Mobility status;Need for lift equipment PT Visit Diagnosis: Other abnormalities of gait and mobility (R26.89);Muscle weakness (generalized) (M62.81);Difficulty in walking, not elsewhere classified (R26.2)    Time: 9095-9071 PT Time Calculation (min) (ACUTE ONLY): 24 min   Charges:   PT Evaluation $PT Re-evaluation: 1 Re-eval   PT General Charges $$ ACUTE PT VISIT: 1 Visit        Westley Blass B. [name] PT, DPT Acute Rehabilitation Services Please use secure chat or  Call Office [phone]   Almarie KATHEE Fleeta [facility] 10/17/2023, 10:39 AM
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
## 2023-10-17 NOTE — Progress Notes (Addendum)
 Occupational Therapy Re-Evaluation Patient Details Name: Gary [name] MRN: [id number] DOB: 06-13-1955 Today's Date: 10/17/2023   History of present illness Patient is 68 y.o. male presenting from home to the ED on 10/11/23 with SoB and abdominal cramping and diarrhea. Treated for with antibiotics for diverticulitis. 7/1 went into acute hypercapnic respiratory failure and was moved to ICU and placed on ventilator for treatment of respiratory failure and metabolic encephalopathy PMH significant for HFpEF (60-65%), AAA, CKD IIIa, COPD, HTN, morbid obesity, gastric ulcers, Tracheostomy in 2023.   OT comments  Pt seen for first OT session since ICU transfer. Pt declined EOB attempts today though reported bed linens dirty and agreeable for therapy to assist with rolling to change. Upon HOB lowering, pt with increasing anxiety and reported SOB w/ difficulty redirecting. With increased time, able to calm pt though pt perseverating on need to talker to MD. Provided red and green therabands per pt request in prior session w/ plans to further address HEP in next sessions. Based on current medical complexities, pt may benefit from further therapy in [facility] setting.      If plan is discharge home, recommend the following:  A lot of help with walking and/or transfers;A lot of help with bathing/dressing/bathroom;Assistance with cooking/housework;Help with stairs or ramp for entrance;Assist for transportation;Two people to help with walking and/or transfers   Equipment Recommendations  Other (comment) (TBD pending progress)    Recommendations for Other Services      Precautions / Restrictions Precautions Precautions: Fall Recall of Precautions/Restrictions: Intact Precaution/Restrictions Comments: cortrak Restrictions Weight Bearing Restrictions Per Provider Order: No       Mobility Bed Mobility               General bed mobility comments: pt initially agreeable for rolling to change reportedly  dirty bed pads but once HOB lowered, pt with increasing anxiety and SOB    Transfers                         Balance                                           ADL either performed or assessed with clinical judgement   ADL Overall ADL's : Needs assistance/impaired     Grooming: Set up;Bed level;Wash/dry face               Lower Body Dressing: Total assistance;Bed level                      Extremity/Trunk Assessment Upper Extremity Assessment Upper Extremity Assessment: Overall WFL for tasks assessed;Right hand dominant   Lower Extremity Assessment Lower Extremity Assessment: Defer to PT evaluation RLE Deficits / Details: able to lift LE for removal of SCDs LLE Deficits / Details: able to lift LE for removal of SCDs   Cervical / Trunk Assessment Cervical / Trunk Assessment: Other exceptions Cervical / Trunk Exceptions: habitus    Vision   Vision Assessment?: No apparent visual deficits   Perception     Praxis     Communication Communication Communication: No apparent difficulties;Impaired Factors Affecting Communication: Trach/intubated;Other (comment) (talks over vent and refuses to write anything down, becomes upset when no one can read his lips)   Cognition Arousal: Alert Behavior During Therapy: Anxious (highly anxious of being on the vent and not getting enough  oxygen ) Cognition: No apparent impairments             OT - Cognition Comments: initially pleasant but with initiation of mobility, pt anxious, reporting vent was messed up by staff (was moved for bed mobility). difficult to redirect and calm                 Following commands: Intact        Cueing   Cueing Techniques: Verbal cues, Gestural cues, Visual cues  Exercises Exercises: Other exercises Other Exercises Other Exercises: LE leg lifts    Shoulder Instructions       General Comments pt with extreme anxiety with being laid flat, ear  O2 probe with poor pleth waveform and in 60% causing increased anxiety PT/OT ask for stillness to be able to readjust pulse ox probe, SpO2 on vent 98%O2, when good pleth returned. Provided red and green therabands per pt request during prior OT eval. Pt also asked if he was being considered for hospice?    Pertinent Vitals/ Pain       Pain Assessment Pain Assessment: Faces Faces Pain Scale: Hurts a little bit Pain Location: all over Pain Descriptors / Indicators: Aching, Discomfort Pain Intervention(s): Monitored during session  Home Living Family/patient expects to be discharged to:: Private residence Living Arrangements: Other relatives Available Help at Discharge: Available PRN/intermittently;Family;Personal care attendant Type of Home: Apartment Home Access: Level entry;Ramped entrance     Home Layout: One level     Bathroom Shower/Tub: Chief Strategy Officer: Handicapped height Bathroom Accessibility: No   Home Equipment: Wheelchair - Nurse, children's (2 wheels);Hospital bed   Additional Comments: patient has a daily aide for ~ 3 hours a day 7x/week (aides) had HH 1x/wk for ~ 3wks      Prior Functioning/Environment              Frequency  Min 2X/week        Progress Toward Goals  OT Goals(current goals can now be found in the care plan section)  Progress towards OT goals: OT to reassess next treatment  Acute Rehab OT Goals Patient Stated Goal: get off of vent, talk to the doctor OT Goal Formulation: With patient Time For Goal Achievement: 10/28/23 Potential to Achieve Goals: Good ADL Goals Pt Will Perform Lower Body Dressing: with mod assist;sit to/from stand Pt Will Transfer to Toilet: Independently;stand pivot transfer Additional ADL Goal #1: Pt will complete bed mobility with supervision A as a precursor to ADLs Additional ADL Goal #2: Pt will tolerate at least 10 minutes of unsupported sitting to prepare for EOB ADLs  Plan       Co-evaluation    PT/OT/SLP Co-Evaluation/Treatment: Yes Reason for Co-Treatment: For patient/therapist safety;To address functional/ADL transfers;Complexity of the patient's impairments (multi-system involvement)   OT goals addressed during session: ADL's and self-care;Strengthening/ROM      AM-PAC OT 6 Clicks Daily Activity     Outcome Measure   Help from another person eating meals?: Total (NPO) Help from another person taking care of personal grooming?: A Little Help from another person toileting, which includes using toliet, bedpan, or urinal?: Total Help from another person bathing (including washing, rinsing, drying)?: A Lot Help from another person to put on and taking off regular upper body clothing?: A Lot Help from another person to put on and taking off regular lower body clothing?: Total 6 Click Score: 10    End of Session Equipment Utilized During Treatment: Other (comment) (vent)  OT  Visit Diagnosis: Unsteadiness on feet (R26.81);Other abnormalities of gait and mobility (R26.89);Muscle weakness (generalized) (M62.81)   Activity Tolerance Patient tolerated treatment well   Patient Left in bed;with call bell/phone within reach;Other (comment) (MD at bedside)   Nurse Communication Mobility status        Time: [phone] OT Time Calculation (min): 23 min  Charges: OT General Charges $OT Visit: 1 Visit OT Evaluation $OT Re-eval: 1 Re-eval  Mliss NOVAK, OTR/L Acute Rehab Services Office: [phone]   Mliss Fish 10/17/2023, 11:01 AM

## 2023-10-17 NOTE — Plan of Care (Signed)
  Problem: Clinical Measurements: Goal: Respiratory complications will improve Outcome: Progressing   Problem: Clinical Measurements: Goal: Cardiovascular complication will be avoided Outcome: Progressing   Problem: Coping: Goal: Level of anxiety will decrease Outcome: Progressing   Problem: Pain Managment: Goal: General experience of comfort will improve and/or be controlled Outcome: Progressing   Problem: Safety: Goal: Ability to remain free from injury will improve Outcome: Progressing

## 2023-10-17 NOTE — Progress Notes (Signed)
 Nutrition Brief Note  Patient discussed in rounds.   S/p bronchoscopy today d/t dyspnea. Thick mucus burden noted throughout trach, concentrated mainly in LLL.  Remains on vent support via trach.  Ok to start tube feeding per NP.   Reviewed labs:  Potassium 3.2 Phosphorus 2.1 (previously 1.3, <1.0) Magnesium  2.4 (wdl)  TF regimen as below: Start Vital High Protein at 37ml/hr and advance by 10ml q8h to goal rate of 16ml/hr ( per day) Provides 2160 kcal, 188g protein, free water  daily   Monitor magnesium , potassium, and phosphorus daily for at least 3 days, MD to replete as needed, as pt is at risk for refeeding syndrome given NPO x5 days. Add Thiamine 100 mg daily for 7 days   Royce Maris, RDN, LDN Clinical Nutrition See AMiON for contact information.

## 2023-10-17 NOTE — Procedures (Signed)
 Bronchoscopy Procedure Note  Gary [name]  [id number]  Feb 02, 1956  Date:10/17/23  Time:11:29 AM   Provider Performing:Jamilee Lafosse C Claudene   Procedure(s):  Flexible bronchoscopy with bronchial alveolar lavage [phone])  Indication(s) Dyspnea while breathing     Consent Risks of the procedure as well as the alternatives and risks of each were explained to the patient and/or caregiver.  Consent for the procedure was obtained and is signed in the bedside chart  Anesthesia Propofol  gtt continuous infusion  Fentanyl  IV 100 mg    Time Out Verified patient identification, verified procedure, site/side was marked, verified correct patient position, special equipment/implants available, medications/allergies/relevant history reviewed, required imaging and test results available.   Sterile Technique Usual hand hygiene, masks, gowns, and gloves were used   Procedure Description Bronchoscope advanced through tracheostomy tube and into airway.  Airways were examined down to subsegmental level with findings noted below.   Following diagnostic evaluation, BAL(s) performed in 40 mls with normal saline and return of 40 mls fluid  Findings:  Thick mucus burden throughout trachea,  concentrated mainly in left lower lobe BAL of Left Lower Lobe obtained    Complications/Tolerance None; patient tolerated the procedure well. Chest X-ray is not needed post procedure.   EBL Minimal   Specimen(s) BAL left lower lobe

## 2023-10-17 NOTE — Progress Notes (Signed)
 RT placed sputum trap in-line with ventilator. Will get sample during next suction.

## 2023-10-18 ENCOUNTER — Inpatient Hospital Stay (HOSPITAL_COMMUNITY)

## 2023-10-18 DIAGNOSIS — J151 Pneumonia due to Pseudomonas: Secondary | ICD-10-CM | POA: Diagnosis not present

## 2023-10-18 DIAGNOSIS — R609 Edema, unspecified: Secondary | ICD-10-CM | POA: Diagnosis not present

## 2023-10-18 DIAGNOSIS — K5792 Diverticulitis of intestine, part unspecified, without perforation or abscess without bleeding: Secondary | ICD-10-CM | POA: Diagnosis not present

## 2023-10-18 DIAGNOSIS — R4182 Altered mental status, unspecified: Secondary | ICD-10-CM | POA: Diagnosis not present

## 2023-10-18 DIAGNOSIS — J9601 Acute respiratory failure with hypoxia: Secondary | ICD-10-CM | POA: Diagnosis not present

## 2023-10-18 LAB — BASIC METABOLIC PANEL WITH GFR
Anion gap: 13 (ref 5–15)
BUN: 29 mg/dL — ABNORMAL HIGH (ref 8–23)
CO2: 28 mmol/L (ref 22–32)
Calcium: 8.3 mg/dL — ABNORMAL LOW (ref 8.9–10.3)
Chloride: 101 mmol/L (ref 98–111)
Creatinine, Ser: 1.68 mg/dL — ABNORMAL HIGH (ref 0.61–1.24)
GFR, Estimated: 45 mL/min — ABNORMAL LOW (ref >60)
Glucose, Bld: 78 mg/dL (ref 70–99)
Potassium: 3.6 mmol/L (ref 3.5–5.1)
Sodium: 142 mmol/L (ref 135–145)

## 2023-10-18 LAB — GLUCOSE, CAPILLARY
Glucose-Capillary: 77 mg/dL (ref 70–99)
Glucose-Capillary: 69 mg/dL — ABNORMAL LOW (ref 70–99)
Glucose-Capillary: 105 mg/dL — ABNORMAL HIGH (ref 70–99)
Glucose-Capillary: 71 mg/dL (ref 70–99)
Glucose-Capillary: 99 mg/dL (ref 70–99)
Glucose-Capillary: 98 mg/dL (ref 70–99)
Glucose-Capillary: 80 mg/dL (ref 70–99)

## 2023-10-18 LAB — CBC
HCT: 38.2 % — ABNORMAL LOW (ref 39.0–52.0)
Hemoglobin: 10.2 g/dL — ABNORMAL LOW (ref 13.0–17.0)
MCH: 19.4 pg — ABNORMAL LOW (ref 26.0–34.0)
MCHC: 26.7 g/dL — ABNORMAL LOW (ref 30.0–36.0)
MCV: 72.6 fL — ABNORMAL LOW (ref 80.0–100.0)
Platelets: 170 K/uL (ref 150–400)
RBC: 5.26 MIL/uL (ref 4.22–5.81)
RDW: 19.9 % — ABNORMAL HIGH (ref 11.5–15.5)
WBC: 10.2 K/uL (ref 4.0–10.5)
nRBC: 0.4 % — ABNORMAL HIGH (ref 0.0–0.2)

## 2023-10-18 LAB — PHOSPHORUS: Phosphorus: 2.9 mg/dL (ref 2.5–4.6)

## 2023-10-18 LAB — CULTURE, BLOOD (ROUTINE X 2): Special Requests: ADEQUATE

## 2023-10-18 LAB — MAGNESIUM: Magnesium: 2.4 mg/dL (ref 1.7–2.4)

## 2023-10-18 LAB — TRIGLYCERIDES: Triglycerides: 318 mg/dL — ABNORMAL HIGH (ref <150)

## 2023-10-18 MED ORDER — TRAZODONE HCL 50 MG PO TABS
50.0000 mg | ORAL_TABLET | Freq: Every evening | ORAL | Status: DC | PRN
  Administered 2023-10-18: 50 mg

## 2023-10-18 MED ORDER — SENNOSIDES-DOCUSATE SODIUM 8.6-50 MG PO TABS: 2.0000 | ORAL_TABLET | Freq: Every evening | ORAL | Status: DC | PRN

## 2023-10-18 MED ORDER — OXYCODONE HCL 5 MG PO TABS
5.0000 mg | ORAL_TABLET | Freq: Two times a day (BID) | ORAL | Status: DC | PRN
  Administered 2023-10-20: 5 mg
  Filled 2023-10-18: qty 1

## 2023-10-18 MED ORDER — HYDROXYZINE HCL 25 MG PO TABS: 25.0000 mg | ORAL_TABLET | Freq: Three times a day (TID) | ORAL | Status: DC | PRN

## 2023-10-18 MED ORDER — DEXTROSE 50 % IV SOLN
INTRAVENOUS | Status: AC
  Administered 2023-10-18: 12.5 g via INTRAVENOUS
  Filled 2023-10-18: qty 50

## 2023-10-18 MED ORDER — DEXTROSE 50 % IV SOLN: 12.5000 g | INTRAVENOUS | Status: AC

## 2023-10-18 MED ORDER — ALPRAZOLAM 0.5 MG PO TABS
0.5000 mg | ORAL_TABLET | Freq: Two times a day (BID) | ORAL | Status: DC
  Administered 2023-10-19 – 2023-10-20 (×3): 0.5 mg
  Filled 2023-10-18 (×3): qty 1

## 2023-10-18 MED ORDER — ALPRAZOLAM 0.5 MG PO TABS
0.5000 mg | ORAL_TABLET | Freq: Two times a day (BID) | ORAL | Status: DC
  Administered 2023-10-18 (×2): 0.5 mg via ORAL
  Filled 2023-10-18 (×2): qty 1

## 2023-10-18 MED ORDER — POTASSIUM CHLORIDE 20 MEQ PO PACK
40.0000 meq | PACK | Freq: Once | ORAL | Status: AC
  Administered 2023-10-18: 40 meq
  Filled 2023-10-18: qty 2

## 2023-10-18 NOTE — Plan of Care (Signed)
  Problem: Safety: Goal: Non-violent Restraint(s) Outcome: Progressing   Problem: Education: Goal: Ability to describe self-care measures that may prevent or decrease complications (Diabetes Survival Skills Education) will improve Outcome: Progressing Goal: Individualized Educational Video(s) Outcome: Progressing   Problem: Coping: Goal: Ability to adjust to condition or change in health will improve Outcome: Progressing   Problem: Fluid Volume: Goal: Ability to maintain a balanced intake and output will improve Outcome: Progressing   Problem: Health Behavior/Discharge Planning: Goal: Ability to identify and utilize available resources and services will improve Outcome: Progressing Goal: Ability to manage health-related needs will improve Outcome: Progressing   Problem: Metabolic: Goal: Ability to maintain appropriate glucose levels will improve Outcome: Progressing   Problem: Nutritional: Goal: Maintenance of adequate nutrition will improve Outcome: Progressing Goal: Progress toward achieving an optimal weight will improve Outcome: Progressing   Problem: Skin Integrity: Goal: Risk for impaired skin integrity will decrease Outcome: Progressing   Problem: Tissue Perfusion: Goal: Adequacy of tissue perfusion will improve Outcome: Progressing   Problem: Education: Goal: Knowledge of General Education information will improve Description: Including pain rating scale, medication(s)/side effects and non-pharmacologic comfort measures Outcome: Progressing   Problem: Health Behavior/Discharge Planning: Goal: Ability to manage health-related needs will improve Outcome: Progressing   Problem: Clinical Measurements: Goal: Ability to maintain clinical measurements within normal limits will improve Outcome: Progressing Goal: Will remain free from infection Outcome: Progressing Goal: Diagnostic test results will improve Outcome: Progressing Goal: Respiratory complications  will improve Outcome: Progressing Goal: Cardiovascular complication will be avoided Outcome: Progressing   Problem: Activity: Goal: Risk for activity intolerance will decrease Outcome: Progressing   Problem: Nutrition: Goal: Adequate nutrition will be maintained Outcome: Progressing   Problem: Coping: Goal: Level of anxiety will decrease Outcome: Progressing   Problem: Elimination: Goal: Will not experience complications related to bowel motility Outcome: Progressing Goal: Will not experience complications related to urinary retention Outcome: Progressing   Problem: Pain Managment: Goal: General experience of comfort will improve and/or be controlled Outcome: Progressing   Problem: Safety: Goal: Ability to remain free from injury will improve Outcome: Progressing   Problem: Skin Integrity: Goal: Risk for impaired skin integrity will decrease Outcome: Progressing
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
## 2023-10-18 NOTE — Progress Notes (Signed)
 [facility] ADULT ICU REPLACEMENT PROTOCOL   The patient does apply for the [facility] Adult ICU Electrolyte Replacment Protocol based on the criteria listed below:   1.Exclusion criteria: TCTS, ECMO, Dialysis, and Myasthenia Gravis patients 2. Is GFR >/= 30 ml/min? Yes.    Patient's GFR today is 45 3. Is SCr </= 2? Yes.   Patient's SCr is 1.68 mg/dL 4. Did SCr increase >/= 0.5 in 24 hours? No. 5.Pt's weight >40kg  Yes.   6. Abnormal electrolyte(s):   K 3.6  7. Electrolytes replaced per protocol 8.  Call MD STAT for K+ </= 2.5, Phos </= 1, or Mag </= 1 Physician:  A. Paliwal  Gary [name] R Gary [name] 10/18/2023 5:00 AM

## 2023-10-19 DIAGNOSIS — K5792 Diverticulitis of intestine, part unspecified, without perforation or abscess without bleeding: Secondary | ICD-10-CM | POA: Diagnosis not present

## 2023-10-19 DIAGNOSIS — J151 Pneumonia due to Pseudomonas: Secondary | ICD-10-CM | POA: Diagnosis not present

## 2023-10-19 DIAGNOSIS — J9601 Acute respiratory failure with hypoxia: Secondary | ICD-10-CM | POA: Diagnosis not present

## 2023-10-19 DIAGNOSIS — R4182 Altered mental status, unspecified: Secondary | ICD-10-CM | POA: Diagnosis not present

## 2023-10-19 LAB — POCT I-STAT 7, (LYTES, BLD GAS, ICA,H+H)
Acid-Base Excess: 14 mmol/L — ABNORMAL HIGH (ref 0.0–2.0)
Acid-Base Excess: 11 mmol/L — ABNORMAL HIGH (ref 0.0–2.0)
Bicarbonate: 38 mmol/L — ABNORMAL HIGH (ref 20.0–28.0)
Bicarbonate: 35.9 mmol/L — ABNORMAL HIGH (ref 20.0–28.0)
Calcium, Ion: 1.06 mmol/L — ABNORMAL LOW (ref 1.15–1.40)
Calcium, Ion: 1.09 mmol/L — ABNORMAL LOW (ref 1.15–1.40)
HCT: 31 % — ABNORMAL LOW (ref 39.0–52.0)
HCT: 32 % — ABNORMAL LOW (ref 39.0–52.0)
Hemoglobin: 10.5 g/dL — ABNORMAL LOW (ref 13.0–17.0)
Hemoglobin: 10.9 g/dL — ABNORMAL LOW (ref 13.0–17.0)
O2 Saturation: 91 %
O2 Saturation: 74 %
Patient temperature: 98.9
Patient temperature: 96.6
Potassium: 3.1 mmol/L — ABNORMAL LOW (ref 3.5–5.1)
Potassium: 3.2 mmol/L — ABNORMAL LOW (ref 3.5–5.1)
Sodium: 141 mmol/L (ref 135–145)
Sodium: 142 mmol/L (ref 135–145)
TCO2: 39 mmol/L — ABNORMAL HIGH (ref 22–32)
TCO2: 37 mmol/L — ABNORMAL HIGH (ref 22–32)
pCO2 arterial: 43.6 mmHg (ref 32–48)
pCO2 arterial: 47.8 mmHg (ref 32–48)
pH, Arterial: 7.549 — ABNORMAL HIGH (ref 7.35–7.45)
pH, Arterial: 7.48 — ABNORMAL HIGH (ref 7.35–7.45)
pO2, Arterial: 55 mmHg — ABNORMAL LOW (ref 83–108)
pO2, Arterial: 35 mmHg — CL (ref 83–108)

## 2023-10-19 LAB — CBC
HCT: 31.2 % — ABNORMAL LOW (ref 39.0–52.0)
Hemoglobin: 8.6 g/dL — ABNORMAL LOW (ref 13.0–17.0)
MCH: 20 pg — ABNORMAL LOW (ref 26.0–34.0)
MCHC: 27.6 g/dL — ABNORMAL LOW (ref 30.0–36.0)
MCV: 72.6 fL — ABNORMAL LOW (ref 80.0–100.0)
Platelets: 162 K/uL (ref 150–400)
RBC: 4.3 MIL/uL (ref 4.22–5.81)
RDW: 19.9 % — ABNORMAL HIGH (ref 11.5–15.5)
WBC: 6.3 K/uL (ref 4.0–10.5)
nRBC: 0.5 % — ABNORMAL HIGH (ref 0.0–0.2)

## 2023-10-19 LAB — GLUCOSE, CAPILLARY
Glucose-Capillary: 96 mg/dL (ref 70–99)
Glucose-Capillary: 84 mg/dL (ref 70–99)
Glucose-Capillary: 82 mg/dL (ref 70–99)

## 2023-10-19 LAB — BASIC METABOLIC PANEL WITH GFR
Anion gap: 10 (ref 5–15)
BUN: 28 mg/dL — ABNORMAL HIGH (ref 8–23)
CO2: 27 mmol/L (ref 22–32)
Calcium: 8.3 mg/dL — ABNORMAL LOW (ref 8.9–10.3)
Chloride: 105 mmol/L (ref 98–111)
Creatinine, Ser: 1.64 mg/dL — ABNORMAL HIGH (ref 0.61–1.24)
GFR, Estimated: 46 mL/min — ABNORMAL LOW (ref >60)
Glucose, Bld: 88 mg/dL (ref 70–99)
Potassium: 3 mmol/L — ABNORMAL LOW (ref 3.5–5.1)
Sodium: 142 mmol/L (ref 135–145)

## 2023-10-19 LAB — MAGNESIUM: Magnesium: 2.4 mg/dL (ref 1.7–2.4)

## 2023-10-19 LAB — TRIGLYCERIDES: Triglycerides: 225 mg/dL — ABNORMAL HIGH (ref <150)

## 2023-10-19 LAB — PHOSPHORUS: Phosphorus: 2.9 mg/dL (ref 2.5–4.6)

## 2023-10-19 MED ORDER — NUTRISOURCE FIBER PO PACK
1.0000 | PACK | Freq: Two times a day (BID) | ORAL | Status: DC
  Administered 2023-10-19 – 2023-10-20 (×3): 1
  Filled 2023-10-19 (×4): qty 1

## 2023-10-19 MED ORDER — POTASSIUM CHLORIDE 20 MEQ PO PACK
40.0000 meq | PACK | ORAL | Status: AC
  Administered 2023-10-19: 40 meq
  Filled 2023-10-19: qty 2

## 2023-10-19 MED ORDER — POTASSIUM CHLORIDE 10 MEQ/100ML IV SOLN
10.0000 meq | INTRAVENOUS | Status: DC
  Administered 2023-10-19 (×2): 10 meq via INTRAVENOUS
  Filled 2023-10-19 (×2): qty 100

## 2023-10-19 MED ORDER — POTASSIUM CHLORIDE 20 MEQ PO PACK
20.0000 meq | PACK | ORAL | Status: DC
  Administered 2023-10-19: 20 meq
  Filled 2023-10-19 (×2): qty 1

## 2023-10-19 MED ORDER — PANTOPRAZOLE SODIUM 40 MG IV SOLR
40.0000 mg | Freq: Every day | INTRAVENOUS | Status: DC
  Administered 2023-10-19: 40 mg via INTRAVENOUS
  Filled 2023-10-19: qty 10

## 2023-10-19 NOTE — Progress Notes (Signed)
 [facility] ADULT ICU REPLACEMENT PROTOCOL   The patient does apply for the [facility] Adult ICU Electrolyte Replacment Protocol based on the criteria listed below:   1.Exclusion criteria: TCTS, ECMO, Dialysis, and Myasthenia Gravis patients 2. Is GFR >/= 30 ml/min? Yes.    Patient's GFR today is 46 3. Is SCr </= 2? Yes.   Patient's SCr is 1.64 mg/dL 4. Did SCr increase >/= 0.5 in 24 hours? No. 5.Pt's weight >40kg  Yes.   6. Abnormal electrolyte(s): K+ 3.0  7. Electrolytes replaced per protocol 8.  Call MD STAT for K+ </= 2.5, Phos </= 1, or Mag </= 1 Physician:  Dr Haze Darner, Norleen Barters 10/19/2023 4:30 AM
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
## 2023-10-19 NOTE — Progress Notes (Signed)
 RT took pt off ventilator, deflated cuff and placed pt on ATC 10L/40%, sats currently 95%. Pt is tolerating trach collar well at this time. Vent on s/b @ bedside for at night per MD.

## 2023-10-19 NOTE — Progress Notes (Signed)
 Pt refusing turns, oral care, and bath. Pt educated on the importance of all these measures to prevent skin break down and bacteria build up. Pt continues to refused. Will continue to educate pt.

## 2023-10-20 DIAGNOSIS — J9601 Acute respiratory failure with hypoxia: Secondary | ICD-10-CM | POA: Diagnosis not present

## 2023-10-20 DIAGNOSIS — K5792 Diverticulitis of intestine, part unspecified, without perforation or abscess without bleeding: Secondary | ICD-10-CM | POA: Diagnosis not present

## 2023-10-20 DIAGNOSIS — J151 Pneumonia due to Pseudomonas: Secondary | ICD-10-CM | POA: Diagnosis not present

## 2023-10-20 DIAGNOSIS — R4182 Altered mental status, unspecified: Secondary | ICD-10-CM | POA: Diagnosis not present

## 2023-10-20 LAB — CULTURE, RESPIRATORY W GRAM STAIN

## 2023-10-20 LAB — GLUCOSE, CAPILLARY
Glucose-Capillary: 101 mg/dL — ABNORMAL HIGH (ref 70–99)
Glucose-Capillary: 117 mg/dL — ABNORMAL HIGH (ref 70–99)
Glucose-Capillary: 83 mg/dL (ref 70–99)
Glucose-Capillary: 89 mg/dL (ref 70–99)
Glucose-Capillary: 92 mg/dL (ref 70–99)
Glucose-Capillary: 93 mg/dL (ref 70–99)

## 2023-10-20 LAB — BASIC METABOLIC PANEL WITH GFR
Anion gap: 13 (ref 5–15)
BUN: 23 mg/dL (ref 8–23)
CO2: 28 mmol/L (ref 22–32)
Calcium: 8.6 mg/dL — ABNORMAL LOW (ref 8.9–10.3)
Chloride: 107 mmol/L (ref 98–111)
Creatinine, Ser: 1.36 mg/dL — ABNORMAL HIGH (ref 0.61–1.24)
GFR, Estimated: 57 mL/min — ABNORMAL LOW (ref >60)
Glucose, Bld: 92 mg/dL (ref 70–99)
Potassium: 3.5 mmol/L (ref 3.5–5.1)
Sodium: 148 mmol/L — ABNORMAL HIGH (ref 135–145)

## 2023-10-20 MED ORDER — NUTRISOURCE FIBER PO PACK
1.0000 | PACK | Freq: Two times a day (BID) | ORAL | Status: DC
  Administered 2023-10-20 – 2023-10-26 (×12): 1 via ORAL
  Filled 2023-10-20 (×13): qty 1

## 2023-10-20 MED ORDER — ALPRAZOLAM 0.5 MG PO TABS
0.5000 mg | ORAL_TABLET | Freq: Two times a day (BID) | ORAL | Status: DC
  Administered 2023-10-20 – 2023-10-22 (×5): 0.5 mg via ORAL
  Filled 2023-10-20 (×5): qty 1

## 2023-10-20 MED ORDER — ORAL CARE MOUTH RINSE
15.0000 mL | OROMUCOSAL | Status: DC
  Administered 2023-10-20 – 2023-10-24 (×43): 15 mL via OROMUCOSAL

## 2023-10-20 MED ORDER — SENNOSIDES-DOCUSATE SODIUM 8.6-50 MG PO TABS: 2.0000 | ORAL_TABLET | Freq: Every evening | ORAL | Status: DC | PRN

## 2023-10-20 MED ORDER — OXYCODONE HCL 5 MG PO TABS: 5.0000 mg | ORAL_TABLET | Freq: Two times a day (BID) | ORAL | Status: DC | PRN

## 2023-10-20 MED ORDER — PANTOPRAZOLE SODIUM 40 MG PO TBEC
40.0000 mg | DELAYED_RELEASE_TABLET | Freq: Every day | ORAL | Status: DC
  Administered 2023-10-20 – 2023-10-25 (×6): 40 mg via ORAL
  Filled 2023-10-20 (×6): qty 1

## 2023-10-20 MED ORDER — INSULIN ASPART 100 UNIT/ML IJ SOLN: 0.0000 [IU] | Freq: Three times a day (TID) | INTRAMUSCULAR | Status: DC

## 2023-10-20 MED ORDER — HYDROXYZINE HCL 25 MG PO TABS: 25.0000 mg | ORAL_TABLET | Freq: Three times a day (TID) | ORAL | Status: DC | PRN

## 2023-10-20 MED ORDER — TRAZODONE HCL 50 MG PO TABS: 50.0000 mg | ORAL_TABLET | Freq: Every evening | ORAL | Status: DC | PRN

## 2023-10-20 MED ORDER — ORAL CARE MOUTH RINSE
15.0000 mL | OROMUCOSAL | Status: DC | PRN
Start: 2023-10-20 — End: 2023-10-26

## 2023-10-20 MED ORDER — THIAMINE MONONITRATE 100 MG PO TABS
100.0000 mg | ORAL_TABLET | Freq: Every day | ORAL | Status: AC
  Administered 2023-10-21: 100 mg via ORAL
  Filled 2023-10-20: qty 1

## 2023-10-20 NOTE — Progress Notes (Signed)
 RT took pt off ventilator, deflated cuff and placed pt on ATC 10L/40%, sats 97%. Pt tolerating ATC well at this time. Vent is on s/b @ bedside if needed. RN notified.

## 2023-10-20 NOTE — Progress Notes (Signed)
 Inflated cuff and placed pt on ventilator. PS/CPAP 8/8, sats currently 95%. Pt is tolerating settings well at this time.

## 2023-10-20 NOTE — Progress Notes (Signed)
 eLink Physician-Brief Progress Note Patient Name: Gary [name] DOB: 01-08-1956 MRN: [id number]   Date of Service  10/20/2023  HPI/Events of Note  BSRN states patient is now taking in a diet, asking for q4 CBG's to be changed to AC/HS with approp SSI  eICU Interventions  Changed SSI to AC/HS     Intervention Category Intermediate Interventions: Hyperglycemia - evaluation and treatment  Gary [name] 10/20/2023, 9:51 PM
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
## 2023-10-20 NOTE — Progress Notes (Signed)
 Pt on ATC 10L/40%, sats 97%. Pt tolerating ATC well at this time. Vent is on s/b @ bedside if needed.

## 2023-10-20 NOTE — Progress Notes (Signed)
 Pt vent alarming due to increased respirations. Pt suctioned moderate amount of thick clear secretions obtained. Respirations improved. As pt resumed sleep respirations decreased to single digits. Switched pt vent mode to [facility] to assist with sleep apnea.

## 2023-10-21 DIAGNOSIS — G9341 Metabolic encephalopathy: Secondary | ICD-10-CM | POA: Diagnosis not present

## 2023-10-21 DIAGNOSIS — R6520 Severe sepsis without septic shock: Secondary | ICD-10-CM

## 2023-10-21 DIAGNOSIS — J9621 Acute and chronic respiratory failure with hypoxia: Secondary | ICD-10-CM | POA: Diagnosis not present

## 2023-10-21 DIAGNOSIS — A4152 Sepsis due to Pseudomonas: Secondary | ICD-10-CM | POA: Diagnosis not present

## 2023-10-21 LAB — GLUCOSE, CAPILLARY
Glucose-Capillary: 94 mg/dL (ref 70–99)
Glucose-Capillary: 92 mg/dL (ref 70–99)
Glucose-Capillary: 111 mg/dL — ABNORMAL HIGH (ref 70–99)
Glucose-Capillary: 98 mg/dL (ref 70–99)
Glucose-Capillary: 91 mg/dL (ref 70–99)
Glucose-Capillary: 97 mg/dL (ref 70–99)

## 2023-10-21 LAB — CULTURE, BLOOD (ROUTINE X 2)

## 2023-10-21 LAB — BASIC METABOLIC PANEL WITH GFR
Anion gap: 9 (ref 5–15)
BUN: 19 mg/dL (ref 8–23)
CO2: 29 mmol/L (ref 22–32)
Calcium: 8.2 mg/dL — ABNORMAL LOW (ref 8.9–10.3)
Chloride: 103 mmol/L (ref 98–111)
Creatinine, Ser: 1.24 mg/dL (ref 0.61–1.24)
GFR, Estimated: >60 mL/min (ref >60)
Glucose, Bld: 95 mg/dL (ref 70–99)
Potassium: 3.5 mmol/L (ref 3.5–5.1)
Sodium: 141 mmol/L (ref 135–145)

## 2023-10-21 MED ORDER — SPIRONOLACTONE 25 MG PO TABS
25.0000 mg | ORAL_TABLET | Freq: Every day | ORAL | Status: DC
  Administered 2023-10-21 – 2023-10-26 (×6): 25 mg via ORAL
  Filled 2023-10-21 (×6): qty 1

## 2023-10-21 MED ORDER — SODIUM CHLORIDE 0.9 % IV SOLN: 2.0000 g | Freq: Three times a day (TID) | INTRAVENOUS | Status: DC

## 2023-10-21 MED ORDER — POTASSIUM CHLORIDE CRYS ER 20 MEQ PO TBCR
40.0000 meq | EXTENDED_RELEASE_TABLET | Freq: Once | ORAL | Status: AC
  Administered 2023-10-21: 40 meq via ORAL
  Filled 2023-10-21: qty 2

## 2023-10-21 MED ORDER — TORSEMIDE 20 MG PO TABS
40.0000 mg | ORAL_TABLET | Freq: Two times a day (BID) | ORAL | Status: DC
  Administered 2023-10-22 (×2): 40 mg via ORAL
  Filled 2023-10-21 (×4): qty 2

## 2023-10-21 MED ORDER — SODIUM CHLORIDE 0.9 % IV SOLN
2.0000 g | Freq: Three times a day (TID) | INTRAVENOUS | Status: DC
  Administered 2023-10-21 – 2023-10-26 (×16): 2 g via INTRAVENOUS
  Filled 2023-10-21 (×16): qty 12.5

## 2023-10-21 NOTE — TOC Initial Note (Signed)
 Transition of Care [facility]) - Initial/Assessment Note    Patient Details  Name: Gary [name] MRN: [id number] Date of Birth: 06-29-1955  [facility] of [facility]) CM/SW Contact:    [name], LCSW Phone Number: 10/21/2023, 12:30 PM  Clinical Narrative:                  12:30 PM Patient is now more medically appropriate for LTACH vs SNF. Medical team made aware.  Expected Discharge Plan: Long Term Acute Care (LTAC) Barriers to Discharge: Insurance Authorization, Continued Medical Work up   Patient Goals and CMS Choice            Expected Discharge Plan and Services   Discharge Planning Services: CM Consult Post Acute Care Choice: Long Term Acute Care (LTAC) Living arrangements for the past 2 months: Apartment                                      Prior Living Arrangements/Services Living arrangements for the past 2 months: Apartment Lives with:: Relatives Patient language and need for interpreter reviewed:: Yes              Criminal Activity/Legal Involvement Pertinent to Current Situation/Hospitalization: No - Comment as needed  Activities of Daily Living      Permission Sought/Granted Permission sought to share information with : Family Supports, Oceanographer granted to share information with : No (Contact information on chart)  Share Information with NAME: [name] Chancy  Permission granted to share info w AGENCY: LTACH  Permission granted to share info w Relationship: Sister  Permission granted to share info w Contact Information: [phone]  Emotional Assessment       Orientation: : Oriented to Self, Oriented to Place, Oriented to  Time, Oriented to Situation Alcohol  / Substance Use: Not Applicable Psych Involvement: No (comment)  Admission diagnosis:  Diverticulitis [K57.92] Acute hypercapnic respiratory failure ([name]) [J96.02] Acute hypoxemic respiratory failure ([name]) [J96.01] Diarrhea, unspecified type  [R19.7] Patient Active Problem List   Diagnosis Date Noted   Acute hypercapnic respiratory failure ([name]) 10/15/2023   CHF (congestive heart failure) ([name]) 06/07/2023   Diarrhea 05/17/2023   Rectal bleeding 05/16/2023   Tracheitis 02/26/2023   Dry skin 01/25/2023   Eyelid cyst, right 01/23/2023   Fluid overload 11/30/2022   Acute on chronic respiratory failure ([name]) 11/02/2022   History of DVT (deep vein thrombosis) 10/27/2022   Acute kidney injury superimposed on CKD ([name]) 10/27/2022   Acute on chronic respiratory failure with hypoxia ([name]) 10/17/2022   Acute hypoxemic respiratory failure ([name]) 09/26/2022   Chronic colitis 09/12/2022   Segmental colitis associated with diverticulosis ([name]) 09/12/2022   Diverticulosis of colon with hemorrhage 09/10/2022   Acute blood loss anemia 09/08/2022   Acute GI bleeding 09/07/2022   Chest pain 08/13/2022   Influenza A 04/11/2022   Hypokalemia 03/06/2022   Chronic obstructive pulmonary disease (COPD) ([name]) 02/03/2022   GERD without esophagitis 02/03/2022   Anxiety 02/03/2022   Melena    Major depressive disorder, recurrent episode, moderate ([name]) 10/22/2021   Pressure injury of skin 09/27/2021   COPD (chronic obstructive pulmonary disease) ([name])    Iron  deficiency anemia    Generalized weakness    CAP (community acquired pneumonia) due to Pneumococcus ([name]) 03/11/2021   (HFpEF) heart failure with preserved ejection fraction ([name])    Hyperlipidemia 02/19/2021   Generalized osteoarthritis of multiple sites 10/31/2020  Hyperkalemia 03/04/2020   Marijuana use 01/17/2020   Thrombocytopenia ([name]) 01/17/2020   Positive hepatitis C antibody test 01/17/2020   Osteoarthritis of glenohumeral joints (Bilateral) 01/17/2020   Osteoarthritis of AC (acromioclavicular) joints (Bilateral) 01/17/2020   Polysubstance abuse ([name]) 08/25/2019   Toxic metabolic encephalopathy 08/25/2019   Chronic pain syndrome 07/14/2019   Anticoagulated 07/14/2019   DDD  (degenerative disc disease), lumbosacral 07/14/2019   DDD (degenerative disc disease), cervical 07/14/2019   Acute on chronic respiratory failure with hypoxia and hypercapnia ([name]) 05/28/2018   Acute on chronic diastolic CHF (congestive heart failure) ([name]) 02/28/2015   OSA (obstructive sleep apnea) 02/28/2015   Morbid obesity with BMI of 50.0-59.9, adult ([name]) 04/01/2014   Hypotension 04/01/2014   PCP:  No primary care provider on file. Pharmacy:   [facility] - [place], [place] - [address] [place] [address] Phone: [phone] Fax: [phone]  [facility] [address] [place] [address] Phone: [phone] Fax: [phone]  [facility] [address]), [place] - [address] [place]) [place] [address] Phone: [phone] Fax: [phone]  [facility] [place], [place] - [address] [place] [address] Phone: [phone] Fax: [phone]     Social Drivers of Health (SDOH) Social History: SDOH Screenings   Food Insecurity: No Food Insecurity (06/07/2023)  Housing: Low Risk  (06/07/2023)  Transportation Needs: No Transportation Needs (06/07/2023)  Utilities: Not At Risk (06/07/2023)  Depression (PHQ2-9): Low Risk  (06/07/2021)  Financial Resource Strain: High Risk (11/21/2022)   Received from [facility]  Physical Activity: Insufficiently Active (05/03/2017)  Social Connections: Moderately Isolated (06/07/2023)  Stress: Stress Concern Present (11/20/2022)   Received from [facility]  Tobacco Use: Medium Risk (10/11/2023)   SDOH Interventions:     Readmission Risk Interventions    03/19/2023    9:58 AM 11/05/2022    2:59 PM 11/05/2022   12:16 PM  Readmission Risk Prevention Plan  Transportation Screening Complete Complete Complete  Medication Review Oceanographer) Complete  Complete Complete  PCP or Specialist appointment within 3-5 days of discharge Complete Complete Complete  HRI or Home Care Consult Complete Complete Complete  SW Recovery Care/Counseling Consult Complete Complete Complete  Palliative Care Screening Complete Complete Complete  Skilled Nursing Facility Not Applicable Not Applicable Not Applicable

## 2023-10-21 NOTE — Progress Notes (Signed)
   10/21/23 0740  Therapy Vitals  Pulse Rate 73  Resp 16  MEWS Score/Color  MEWS Score 0  MEWS Score Color Green  Oxygen  Therapy/Pulse Ox  O2 Device (S)  Tracheostomy Collar  O2 Therapy Oxygen  humidified  O2 Flow Rate (L/min) 10 L/min  FiO2 (%) 40 %  SpO2 98 %  Equipment wiped down Yes     Pt taken off vent and placed on ATC 10L/40% with cuff deflated. Pt's VSS and is tolerating well. RT will monitor.

## 2023-10-21 NOTE — Progress Notes (Signed)
 Nutrition Follow-up  DOCUMENTATION CODES:   Morbid obesity  INTERVENTION:  Pt notably with adequate oral intake during all prior admissions once off vent support. Suspect his oral intake will continue to meet nutritional needs given his report of good appetite. Will follow up once more to ensure no additional nutrition related needs arise.   NUTRITION DIAGNOSIS:   Inadequate oral intake related to acute illness as evidenced by NPO status. - improving; patient now on trach collar and regular diet  GOAL:  Patient will meet greater than or equal to 90% of their needs - progressing  MONITOR:  Vent status, Labs, Weight trends, I & O's  REASON FOR ASSESSMENT:  Consult Enteral/tube feeding initiation and management  ASSESSMENT:  Pt presented to the ED on 6/27 with abdominal pain and diarrhea, found to have diverticulitis and admitted for vent management d/t hypercapnia. PMH significant for HFpEF, AAA, COPD, HTN, anemia, chronic trach.  7/1 transferred from ED to ICU on vent support 7/2 cortrak placed 7/6 night time vent; diet advanced to regular 7/7 PMV, continues regular diet  Spoke with patient at bedside. 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  He tries to prepare foods when he can but not as frequently as he used to.   He reports having a good appetite. He ate scrambled eggs, bacon, grits and toast for breakfast. He mentions that he plans to eat a hamburger and french fries for lunch.   Cortrak still in place at time of visit but given pt's report of good appetite and documented 100% meal completions, Cortrak no longer needed for nutrition support. Discussed discontinuing with RN.   Patient is very familiar to RD team from prior admissions. Per review, multiple consults and attempts at nutrition education have been initiated for heart healthy, low sodium diet education in setting of HF. Pt notably with adequate oral intake during all prior admissions once off vent support. Suspect  his oral intake will continue to meet nutritional needs given his report of good appetite. Will follow up once more to ensure no additional nutrition related needs arise.    Medications: nutrisource fiber BID, torsemide  BID (starting 7/8), IV abx  Labs: reviewed    Diet Order:   Diet Order             Diet regular Room service appropriate? Yes with Assist; Fluid consistency: Thin  Diet effective now                   EDUCATION NEEDS:   No education needs have been identified at this time  Skin:  Skin Assessment: Reviewed RN Assessment  Last BM:  7/2 type 7 x2  Height:   Ht Readings from Last 1 Encounters:  10/11/23 6' 4 (1.93 m)    Weight:   Wt Readings from Last 1 Encounters:  10/21/23 (!) 193.8 kg    Ideal Body Weight:  91.8 kg  BMI:  Body mass index is 52.01 kg/m.  Estimated Nutritional Needs:   Kcal:  2200-2400  Protein:  180g  Fluid:  >/=2L  Royce Maris, RDN, LDN Clinical Nutrition See AMiON for contact information.

## 2023-10-21 NOTE — Plan of Care (Signed)
  Problem: Safety: Goal: Non-violent Restraint(s) Outcome: Progressing   Problem: Education: Goal: Ability to describe self-care measures that may prevent or decrease complications (Diabetes Survival Skills Education) will improve Outcome: Progressing Goal: Individualized Educational Video(s) Outcome: Progressing   Problem: Coping: Goal: Ability to adjust to condition or change in health will improve Outcome: Progressing   Problem: Fluid Volume: Goal: Ability to maintain a balanced intake and output will improve Outcome: Progressing   Problem: Health Behavior/Discharge Planning: Goal: Ability to identify and utilize available resources and services will improve Outcome: Progressing Goal: Ability to manage health-related needs will improve Outcome: Progressing   Problem: Metabolic: Goal: Ability to maintain appropriate glucose levels will improve Outcome: Progressing   Problem: Nutritional: Goal: Maintenance of adequate nutrition will improve Outcome: Progressing Goal: Progress toward achieving an optimal weight will improve Outcome: Progressing   Problem: Skin Integrity: Goal: Risk for impaired skin integrity will decrease Outcome: Progressing   Problem: Tissue Perfusion: Goal: Adequacy of tissue perfusion will improve Outcome: Progressing   Problem: Education: Goal: Knowledge of General Education information will improve Description: Including pain rating scale, medication(s)/side effects and non-pharmacologic comfort measures Outcome: Progressing   Problem: Health Behavior/Discharge Planning: Goal: Ability to manage health-related needs will improve Outcome: Progressing   Problem: Clinical Measurements: Goal: Ability to maintain clinical measurements within normal limits will improve Outcome: Progressing Goal: Will remain free from infection Outcome: Progressing Goal: Diagnostic test results will improve Outcome: Progressing Goal: Respiratory complications  will improve Outcome: Progressing Goal: Cardiovascular complication will be avoided Outcome: Progressing   Problem: Activity: Goal: Risk for activity intolerance will decrease Outcome: Progressing   Problem: Nutrition: Goal: Adequate nutrition will be maintained Outcome: Progressing   Problem: Coping: Goal: Level of anxiety will decrease Outcome: Progressing   Problem: Elimination: Goal: Will not experience complications related to bowel motility Outcome: Progressing Goal: Will not experience complications related to urinary retention Outcome: Progressing   Problem: Pain Managment: Goal: General experience of comfort will improve and/or be controlled Outcome: Progressing   Problem: Safety: Goal: Ability to remain free from injury will improve Outcome: Progressing   Problem: Skin Integrity: Goal: Risk for impaired skin integrity will decrease Outcome: Progressing

## 2023-10-21 NOTE — TOC Progression Note (Signed)
 Transition of Care [facility]) - Progression Note    Patient Details  Name: Gary [name] MRN: [id number] Date of Birth: 1955/06/28  [facility] CM/SW Contact  Tom-Johnson, Harvest Muskrat, RN Phone Number: 10/21/2023, 12:50 PM  Clinical Narrative:     CM consulted for LTAC placement. CM spoke with patient's sister, adrien and she consented to placement. Requested [facility]. CM called in referral to Lauren with acceptance voiced. Lauren will start insurance auth today. CM will continue to follow as patient progresses with care towards discharge.           Expected Discharge Plan: Long Term Acute Care (LTAC) Barriers to Discharge: English as a second language teacher, Continued Medical Work up  Expected Discharge Plan and Services   Discharge Planning Services: CM Consult Post Acute Care Choice: Long Term Acute Care (LTAC) Living arrangements for the past 2 months: Apartment                                       Social Determinants of Health (SDOH) Interventions SDOH Screenings   Food Insecurity: No Food Insecurity (06/07/2023)  Housing: Low Risk  (06/07/2023)  Transportation Needs: No Transportation Needs (06/07/2023)  Utilities: Not At Risk (06/07/2023)  Depression (PHQ2-9): Low Risk  (06/07/2021)  Financial Resource Strain: High Risk (11/21/2022)   Received from [facility]  Physical Activity: Insufficiently Active (05/03/2017)  Social Connections: Moderately Isolated (06/07/2023)  Stress: Stress Concern Present (11/20/2022)   Received from [facility]  Tobacco Use: Medium Risk (10/11/2023)    Readmission Risk Interventions    03/19/2023    9:58 AM 11/05/2022    2:59 PM 11/05/2022   12:16 PM  Readmission Risk Prevention Plan  Transportation Screening Complete Complete Complete  Medication Review Oceanographer) Complete Complete Complete  PCP or Specialist appointment within 3-5 days of discharge Complete Complete Complete  HRI or Home Care Consult  Complete Complete Complete  SW Recovery Care/Counseling Consult Complete Complete Complete  Palliative Care Screening Complete Complete Complete  Skilled Nursing Facility Not Applicable Not Applicable Not Applicable

## 2023-10-21 NOTE — Progress Notes (Signed)
 Switched pt vent mode to [facility] to assist with sleep apnea.
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
## 2023-10-22 DIAGNOSIS — I5032 Chronic diastolic (congestive) heart failure: Secondary | ICD-10-CM

## 2023-10-22 DIAGNOSIS — J9621 Acute and chronic respiratory failure with hypoxia: Secondary | ICD-10-CM | POA: Diagnosis not present

## 2023-10-22 DIAGNOSIS — G928 Other toxic encephalopathy: Secondary | ICD-10-CM

## 2023-10-22 DIAGNOSIS — J9622 Acute and chronic respiratory failure with hypercapnia: Secondary | ICD-10-CM | POA: Diagnosis not present

## 2023-10-22 DIAGNOSIS — Z930 Tracheostomy status: Secondary | ICD-10-CM | POA: Diagnosis not present

## 2023-10-22 DIAGNOSIS — B965 Pseudomonas (aeruginosa) (mallei) (pseudomallei) as the cause of diseases classified elsewhere: Secondary | ICD-10-CM

## 2023-10-22 DIAGNOSIS — J189 Pneumonia, unspecified organism: Secondary | ICD-10-CM

## 2023-10-22 DIAGNOSIS — G4733 Obstructive sleep apnea (adult) (pediatric): Secondary | ICD-10-CM

## 2023-10-22 LAB — CBC
HCT: 35.3 % — ABNORMAL LOW (ref 39.0–52.0)
Hemoglobin: 8.8 g/dL — ABNORMAL LOW (ref 13.0–17.0)
MCH: 19.3 pg — ABNORMAL LOW (ref 26.0–34.0)
MCHC: 24.9 g/dL — ABNORMAL LOW (ref 30.0–36.0)
MCV: 77.4 fL — ABNORMAL LOW (ref 80.0–100.0)
Platelets: 171 K/uL (ref 150–400)
RBC: 4.56 MIL/uL (ref 4.22–5.81)
RDW: 20 % — ABNORMAL HIGH (ref 11.5–15.5)
WBC: 6.6 K/uL (ref 4.0–10.5)
nRBC: 0 % (ref 0.0–0.2)

## 2023-10-22 LAB — BASIC METABOLIC PANEL WITH GFR
Anion gap: 7 (ref 5–15)
Anion gap: 9 (ref 5–15)
BUN: 20 mg/dL (ref 8–23)
BUN: 20 mg/dL (ref 8–23)
CO2: 27 mmol/L (ref 22–32)
CO2: 28 mmol/L (ref 22–32)
Calcium: 8.4 mg/dL — ABNORMAL LOW (ref 8.9–10.3)
Calcium: 8.5 mg/dL — ABNORMAL LOW (ref 8.9–10.3)
Chloride: 104 mmol/L (ref 98–111)
Chloride: 106 mmol/L (ref 98–111)
Creatinine, Ser: 1.15 mg/dL (ref 0.61–1.24)
Creatinine, Ser: 1.17 mg/dL (ref 0.61–1.24)
GFR, Estimated: >60 mL/min (ref >60)
GFR, Estimated: >60 mL/min (ref >60)
Glucose, Bld: 111 mg/dL — ABNORMAL HIGH (ref 70–99)
Glucose, Bld: 112 mg/dL — ABNORMAL HIGH (ref 70–99)
Potassium: 4 mmol/L (ref 3.5–5.1)
Potassium: 4.1 mmol/L (ref 3.5–5.1)
Sodium: 140 mmol/L (ref 135–145)
Sodium: 141 mmol/L (ref 135–145)

## 2023-10-22 LAB — GLUCOSE, CAPILLARY
Glucose-Capillary: 88 mg/dL (ref 70–99)
Glucose-Capillary: 82 mg/dL (ref 70–99)
Glucose-Capillary: 89 mg/dL (ref 70–99)
Glucose-Capillary: 123 mg/dL — ABNORMAL HIGH (ref 70–99)
Glucose-Capillary: 125 mg/dL — ABNORMAL HIGH (ref 70–99)
Glucose-Capillary: 98 mg/dL (ref 70–99)

## 2023-10-22 MED ORDER — ENOXAPARIN SODIUM 100 MG/ML IJ SOSY
0.5000 mg/kg | PREFILLED_SYRINGE | INTRAMUSCULAR | Status: DC
  Administered 2023-10-22 – 2023-10-25 (×4): 97.5 mg via SUBCUTANEOUS
  Filled 2023-10-22 (×5): qty 0.97

## 2023-10-22 NOTE — Progress Notes (Signed)
 Occupational Therapy Treatment Patient Details Name: Gary [name] MRN: [id number] DOB: 01/03/1956 Today's Date: 10/22/2023   History of present illness Patient is 68 y.o. male presenting from home to the ED on 10/11/23 with SoB and abdominal cramping and diarrhea. Treated for with antibiotics for diverticulitis. 7/1 went into acute hypercapnic respiratory failure and was moved to ICU and placed on ventilator for treatment of respiratory failure and metabolic encephalopathy PMH significant for HFpEF (60-65%), AAA, CKD IIIa, COPD, HTN, morbid obesity, gastric ulcers, Tracheostomy in 2023.   OT comments  Patient appeared lethargic initially and participated with OT at bed level for grooming and donning socks. Patient placed in chair position and became more alert. While performing BLE AROM patient asked to get to EOB and attempt standing with RW. Patient required increased time and max assist +2 to get to EOB. Once on EOB patient able to maintain sitting balance with min to CGA. Patient quickly fatigued and demonstrated increased posterior leaning and resting back on bed. Patient unable to continue to sit on EOB and was assist back to supine with total assist +4. Patient performed rolling in bed to straighten bed pads and was found to have had bowel incontinence and was assisted with cleaning. Discharge recommendations continue to be appropriate.  Acute OT to continue to follow to address established goals to facilitate DC to next venue of care.        If plan is discharge home, recommend the following:  A lot of help with walking and/or transfers;A lot of help with bathing/dressing/bathroom;Assistance with cooking/housework;Help with stairs or ramp for entrance;Assist for transportation;Two people to help with walking and/or transfers   Equipment Recommendations  Other (comment) (TBD pending progress)    Recommendations for Other Services      Precautions / Restrictions Precautions Precautions:  Fall Recall of Precautions/Restrictions: Intact Restrictions Weight Bearing Restrictions Per Provider Order: No       Mobility Bed Mobility Overal bed mobility: Needs Assistance Bed Mobility: Rolling, Supine to Sit, Sit to Supine Rolling: Max assist   Supine to sit: Max assist, +2 for physical assistance Sit to supine: Total assist, +2 for physical assistance   General bed mobility comments: increased time and max assist +2 to get from supine to sitting EOB, due to fatigue from sitting on EOB +4 to return to supine    Transfers                   General transfer comment: deferred due to weakness     Balance Overall balance assessment: Needs assistance Sitting-balance support: Single extremity supported, Bilateral upper extremity supported, Feet supported Sitting balance-[name] Scale: Poor Sitting balance - Comments: sitting on EOB with UE support and quickly fatigued and leaned back on bed and unable to continue to sit on EOB Postural control: Posterior lean     Standing balance comment: unable to attempt                           ADL either performed or assessed with clinical judgement   ADL Overall ADL's : Needs assistance/impaired     Grooming: Set up;Bed level;Wash/dry face       Lower Body Bathing: Maximal assistance;Bed level Lower Body Bathing Details (indicate cue type and reason): assistance cleaning following bowel incontinence                       General ADL Comments: limited due to  weakness and fatigue    Extremity/Trunk Assessment              Vision       Perception     Praxis     Communication Communication Communication: No apparent difficulties;Impaired Factors Affecting Communication: Trach/intubated   Cognition Arousal: Alert Behavior During Therapy: WFL for tasks assessed/performed Cognition: No apparent impairments             OT - Cognition Comments: unaware of deficits                  Following commands: Intact        Cueing   Cueing Techniques: Verbal cues, Gestural cues, Visual cues  Exercises      Shoulder Instructions       General Comments      Pertinent Vitals/ Pain       Pain Assessment Pain Assessment: Faces Faces Pain Scale: Hurts a little bit Pain Location: generalized Pain Descriptors / Indicators: Grimacing, Discomfort Pain Intervention(s): Monitored during session, Repositioned  Home Living                                          Prior Functioning/Environment              Frequency  Min 2X/week        Progress Toward Goals  OT Goals(current goals can now be found in the care plan section)  Progress towards OT goals: Progressing toward goals  Acute Rehab OT Goals Patient Stated Goal: to walk again OT Goal Formulation: With patient Time For Goal Achievement: 10/28/23 Potential to Achieve Goals: Good ADL Goals Pt Will Perform Lower Body Dressing: with mod assist;sit to/from stand Pt Will Transfer to Toilet: Independently;stand pivot transfer Additional ADL Goal #1: Pt will complete bed mobility with supervision A as a precursor to ADLs Additional ADL Goal #2: Pt will tolerate at least 10 minutes of unsupported sitting to prepare for EOB ADLs  Plan      Co-evaluation    PT/OT/SLP Co-Evaluation/Treatment: Yes Reason for Co-Treatment: For patient/therapist safety;To address functional/ADL transfers;Complexity of the patient's impairments (multi-system involvement) PT goals addressed during session: Mobility/safety with mobility OT goals addressed during session: ADL's and self-care;Strengthening/ROM      AM-PAC OT 6 Clicks Daily Activity     Outcome Measure   Help from another person eating meals?: A Little Help from another person taking care of personal grooming?: A Little Help from another person toileting, which includes using toliet, bedpan, or urinal?: Total Help from another person  bathing (including washing, rinsing, drying)?: A Lot Help from another person to put on and taking off regular upper body clothing?: A Lot Help from another person to put on and taking off regular lower body clothing?: Total 6 Click Score: 12    End of Session Equipment Utilized During Treatment: Gait belt;Rolling walker (2 wheels);Oxygen   OT Visit Diagnosis: Unsteadiness on feet (R26.81);Other abnormalities of gait and mobility (R26.89);Muscle weakness (generalized) (M62.81)   Activity Tolerance Patient limited by fatigue   Patient Left in bed;with call bell/phone within reach;with nursing/sitter in room   Nurse Communication Mobility status        Time: 8560-8486 OT Time Calculation (min): 34 min  Charges: OT General Charges $OT Visit: 1 Visit OT Treatments $Self Care/Home Management : 8-22 mins  Dick Laine, OTA Acute Rehabilitation Services  Office [phone]  Harjot Dibello LITTIE Laine 10/22/2023, 3:24 PM

## 2023-10-22 NOTE — TOC Progression Note (Signed)
 Transition of Care [facility]) - Progression Note    Patient Details  Name: Gary [name] MRN: [id number] Date of Birth: 1955-09-22  Transition of Care [facility]) CM/SW Contact  Tom-Johnson, Marquee Fuchs Daphne, RN Phone Number: 10/22/2023, 3:24 PM  Clinical Narrative:     CM notified by MD that P2P for LTACH is denied by insurance. Patient and sister, [name] notified. Lauren at [facility] also notified.    TOC will continue to follow for discharge disposition.       Expected Discharge Plan: Long Term Acute Care (LTAC) Barriers to Discharge: English as a second language teacher, Continued Medical Work up  Expected Discharge Plan and Services   Discharge Planning Services: CM Consult Post Acute Care Choice: Long Term Acute Care (LTAC) Living arrangements for the past 2 months: Apartment                                       Social Determinants of Health (SDOH) Interventions SDOH Screenings   Food Insecurity: No Food Insecurity (06/07/2023)  Housing: Low Risk  (06/07/2023)  Transportation Needs: No Transportation Needs (06/07/2023)  Utilities: Not At Risk (06/07/2023)  Depression (PHQ2-9): Low Risk  (06/07/2021)  Financial Resource Strain: High Risk (11/21/2022)   Received from [facility]  Physical Activity: Insufficiently Active (05/03/2017)  Social Connections: Moderately Isolated (06/07/2023)  Stress: Stress Concern Present (11/20/2022)   Received from [facility]  Tobacco Use: Medium Risk (10/11/2023)    Readmission Risk Interventions    03/19/2023    9:58 AM 11/05/2022    2:59 PM 11/05/2022   12:16 PM  Readmission Risk Prevention Plan  Transportation Screening Complete Complete Complete  Medication Review Oceanographer) Complete Complete Complete  PCP or Specialist appointment within 3-5 days of discharge Complete Complete Complete  HRI or Home Care Consult Complete Complete Complete  SW Recovery Care/Counseling Consult Complete Complete Complete  Palliative Care Screening  Complete Complete Complete  Skilled Nursing Facility Not Applicable Not Applicable Not Applicable
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
## 2023-10-22 NOTE — NC FL2 (Signed)
   MEDICAID FL2 LEVEL OF CARE FORM     IDENTIFICATION  Patient Name: Gary [name] Birthdate: 11/29/1955 Sex: male Admission Date (Current Location): 10/11/2023  [facility] [place] Number:  Producer, television/film/video and Address:  [facility] [place], [address] [place], [place] [address]      Provider Number: [id number]  Attending Physician Name and Address:  Isadora Hose, MD  Relative Name and Phone Number:       Current Level of Care: Hospital Recommended Level of Care: Vent SNF Prior Approval Number:    Date Approved/Denied:   PASRR Number: [id number]  Discharge Plan: SNF    Current Diagnoses: Patient Active Problem List   Diagnosis Date Noted   Acute hypercapnic respiratory failure ([name]) 10/15/2023   CHF (congestive heart failure) ([name]) 06/07/2023   Diarrhea 05/17/2023   Rectal bleeding 05/16/2023   Tracheitis 02/26/2023   Dry skin 01/25/2023   Eyelid cyst, right 01/23/2023   Fluid overload 11/30/2022   Acute on chronic respiratory failure ([name]) 11/02/2022   History of DVT (deep vein thrombosis) 10/27/2022   Acute kidney injury superimposed on CKD ([name]) 10/27/2022   Acute on chronic respiratory failure with hypoxia ([name]) 10/17/2022   Acute hypoxemic respiratory failure ([name]) 09/26/2022   Chronic colitis 09/12/2022   Segmental colitis associated with diverticulosis ([name]) 09/12/2022   Diverticulosis of colon with hemorrhage 09/10/2022   Acute blood loss anemia 09/08/2022   Acute GI bleeding 09/07/2022   Chest pain 08/13/2022   Influenza A 04/11/2022   Hypokalemia 03/06/2022   Chronic obstructive pulmonary disease (COPD) ([name]) 02/03/2022   GERD without esophagitis 02/03/2022   Anxiety 02/03/2022   Melena    Major depressive disorder, recurrent episode, moderate ([name]) 10/22/2021   Pressure injury of skin 09/27/2021   COPD (chronic obstructive pulmonary disease) ([name])    Iron  deficiency anemia    Generalized weakness    CAP  (community acquired pneumonia) due to Pneumococcus ([name]) 03/11/2021   (HFpEF) heart failure with preserved ejection fraction ([name])    Hyperlipidemia 02/19/2021   Generalized osteoarthritis of multiple sites 10/31/2020   Hyperkalemia 03/04/2020   Marijuana use 01/17/2020   Thrombocytopenia ([name]) 01/17/2020   Positive hepatitis C antibody test 01/17/2020   Osteoarthritis of glenohumeral joints (Bilateral) 01/17/2020   Osteoarthritis of AC (acromioclavicular) joints (Bilateral) 01/17/2020   Polysubstance abuse ([name]) 08/25/2019   Toxic metabolic encephalopathy 08/25/2019   Chronic pain syndrome 07/14/2019   Anticoagulated 07/14/2019   DDD (degenerative disc disease), lumbosacral 07/14/2019   DDD (degenerative disc disease), cervical 07/14/2019   Acute on chronic respiratory failure with hypoxia and hypercapnia ([name]) 05/28/2018   Acute on chronic diastolic CHF (congestive heart failure) ([name]) 02/28/2015   OSA (obstructive sleep apnea) 02/28/2015   Morbid obesity with BMI of 50.0-59.9, adult ([name]) 04/01/2014   Hypotension 04/01/2014    Orientation RESPIRATION BLADDER Height & Weight     Self, Situation, Time, Place  Vent, Tracheostomy (vent at night; 7mm XLT distal Shiley cuffed trach, 10L O2, 40% FiO2) Continent, External catheter Weight: (!) 427 lb 4 oz (193.8 kg) Height:  6' 4 (193 cm)  BEHAVIORAL SYMPTOMS/MOOD NEUROLOGICAL BOWEL NUTRITION STATUS      Incontinent Diet (See dc summary, regular)  AMBULATORY STATUS COMMUNICATION OF NEEDS Skin   Extensive Assist Verbally Other (Comment) (Dermatitis)                       Personal Care Assistance Level of Assistance  Bathing, Feeding,  Dressing Bathing Assistance: Maximum assistance Feeding assistance: Limited assistance Dressing Assistance: Maximum assistance     Functional Limitations Info  Sight Sight Info: Impaired Hearing Info: Adequate Speech Info: Adequate    SPECIAL CARE FACTORS FREQUENCY  PT (By licensed PT), OT  (By licensed OT)     PT Frequency: 5x/week OT Frequency: 5x/week            Contractures Contractures Info: Not present    Additional Factors Info  Code Status, Allergies Code Status Info: Full Allergies Info: NKA           Current Medications (10/22/2023):  This is the current hospital active medication list Current Facility-Administered Medications  Medication Dose Route Frequency Provider Last Rate Last Admin   ALPRAZolam  (XANAX ) tablet 0.5 mg  0.5 mg Oral BID Kara Carrier B, MD   0.5 mg at 10/22/23 9075   arformoterol  (BROVANA ) nebulizer solution 15 mcg  15 mcg Nebulization BID Smith, Daniel C, MD   15 mcg at 10/22/23 9247   artificial tears ophthalmic solution 1 drop  1 drop Both Eyes PRN Pamella Ozell LABOR, DO       budesonide  (PULMICORT ) nebulizer solution 0.25 mg  0.25 mg Nebulization BID Claudene Toribio BROCKS, MD   0.25 mg at 10/22/23 9247   ceFEPIme  (MAXIPIME ) 2 g in sodium chloride  0.9 % 100 mL IVPB  2 g Intravenous Q8H Bowser, Ronnald BRAVO, NP 200 mL/hr at 10/22/23 1333 2 g at 10/22/23 1333   Chlorhexidine  Gluconate Cloth 2 % PADS 6 each  6 each Topical Daily Maree Harder, MD   6 each at 10/21/23 2237   clotrimazole  (LOTRIMIN ) 1 % cream   Topical BID Penna, Michael A, DO   Given at 10/22/23 9074   enoxaparin  (LOVENOX ) injection 97.5 mg  0.5 mg/kg Subcutaneous Q24H Isadora Hose, MD   97.5 mg at 10/22/23 1603   fiber (NUTRISOURCE FIBER) 1 packet  1 packet Oral BID Dewald, Jonathan B, MD   1 packet at 10/22/23 9075   hydrOXYzine  (ATARAX ) tablet 25 mg  25 mg Oral TID PRN Kara Carrier NOVAK, MD       ipratropium-albuterol  (DUONEB) 0.5-2.5 (3) MG/3ML nebulizer solution 3 mL  3 mL Nebulization Q6H PRN Penna, Michael A, DO   3 mL at 10/18/23 1831   ondansetron  (ZOFRAN ) injection 4 mg  4 mg Intravenous Q6H PRN Maree Harder, MD       Oral care mouth rinse  15 mL Mouth Rinse Q2H Kara Carrier NOVAK, MD   15 mL at 10/22/23 1603   Oral care mouth rinse  15 mL Mouth Rinse PRN Kara Carrier NOVAK, MD       oxyCODONE  (Oxy IR/ROXICODONE ) immediate release tablet 5 mg  5 mg Oral BID PRN Kara Carrier NOVAK, MD       pantoprazole  (PROTONIX ) EC tablet 40 mg  40 mg Oral QHS Zhou, Jenny Z, RPH   40 mg at 10/21/23 2144   senna-docusate (Senokot-S) tablet 2 tablet  2 tablet Oral QHS PRN Kara Carrier NOVAK, MD       spironolactone  (ALDACTONE ) tablet 25 mg  25 mg Oral Daily Bowser, Grace E, NP   25 mg at 10/22/23 9075   torsemide  (DEMADEX ) tablet 40 mg  40 mg Oral BID Bowser, Grace E, NP   40 mg at 10/22/23 0810   traZODone  (DESYREL ) tablet 50 mg  50 mg Oral QHS PRN Kara Carrier NOVAK, MD         Discharge Medications: Please see  discharge summary for a list of discharge medications.  Relevant Imaging Results:  Relevant Lab Results:   Additional Information SS# [id number]  Inocente GORMAN Kindle, LCSW

## 2023-10-22 NOTE — TOC Progression Note (Signed)
 Transition of Care [facility]) - Progression Note    Patient Details  Name: Gary [name] MRN: [id number] Date of Birth: 1956-02-12  Transition of Care [facility]) CM/SW Contact  [name], LCSW Phone Number: 10/22/2023, 5:18 PM  Clinical Narrative:     5:18 PM CSW submitted patient's referrals to SNFs in [place] that accept trach/vent patients ([facility], [facility], [facility]).  Expected Discharge Plan: Long Term Nursing Home Barriers to Discharge: Continued Medical Work up, English as a second language teacher, SNF Pending bed offer  Expected Discharge Plan and Services In-house Referral: Clinical Social Work Discharge Planning Services: [employer] Consult Post Acute Care Choice: Skilled Nursing Facility Living arrangements for the past 2 months: Apartment                                       Social Determinants of Health (SDOH) Interventions SDOH Screenings   Food Insecurity: No Food Insecurity (06/07/2023)  Housing: Low Risk  (06/07/2023)  Transportation Needs: No Transportation Needs (06/07/2023)  Utilities: Not At Risk (06/07/2023)  Depression (PHQ2-9): Low Risk  (06/07/2021)  Financial Resource Strain: High Risk (11/21/2022)   Received from [facility]  Physical Activity: Insufficiently Active (05/03/2017)  Social Connections: Moderately Isolated (06/07/2023)  Stress: Stress Concern Present (11/20/2022)   Received from [facility]  Tobacco Use: Medium Risk (10/11/2023)    Readmission Risk Interventions    03/19/2023    9:58 AM 11/05/2022    2:59 PM 11/05/2022   12:16 PM  Readmission Risk Prevention Plan  Transportation Screening Complete Complete Complete  Medication Review Oceanographer) Complete Complete Complete  PCP or Specialist appointment within 3-5 days of discharge Complete Complete Complete  HRI or Home Care Consult Complete Complete Complete  SW Recovery Care/Counseling Consult Complete Complete Complete  Palliative Care Screening  Complete Complete Complete  Skilled Nursing Facility Not Applicable Not Applicable Not Applicable

## 2023-10-22 NOTE — TOC Progression Note (Signed)
 Transition of Care [facility]) - Progression Note    Patient Details  Name: Gary [name] MRN: [id number] Date of Birth: 1956/01/16  Transition of Care [facility]) CM/SW Contact  Tom-Johnson, Chrystian Ressler Daphne, RN Phone Number: 10/22/2023, 12:36 PM  Clinical Narrative:     CM notified by Tinnie at [facility] that a Peer to peer is requested by Insurance: Timed Predetermination P2P: MD to call in to Medical Director  by 4pm today 10/22/23. MD can call [phone] option 5. They will need patient Name DOB and Insurance member # [id number]. CM notified MD.  CM will continue to follow for discharge needs as patient progresses with care towards discharge.          Expected Discharge Plan: Long Term Acute Care (LTAC) Barriers to Discharge: English as a second language teacher, Continued Medical Work up  Expected Discharge Plan and Services   Discharge Planning Services: CM Consult Post Acute Care Choice: Long Term Acute Care (LTAC) Living arrangements for the past 2 months: Apartment                                       Social Determinants of Health (SDOH) Interventions SDOH Screenings   Food Insecurity: No Food Insecurity (06/07/2023)  Housing: Low Risk  (06/07/2023)  Transportation Needs: No Transportation Needs (06/07/2023)  Utilities: Not At Risk (06/07/2023)  Depression (PHQ2-9): Low Risk  (06/07/2021)  Financial Resource Strain: High Risk (11/21/2022)   Received from [facility]  Physical Activity: Insufficiently Active (05/03/2017)  Social Connections: Moderately Isolated (06/07/2023)  Stress: Stress Concern Present (11/20/2022)   Received from [facility]  Tobacco Use: Medium Risk (10/11/2023)    Readmission Risk Interventions    03/19/2023    9:58 AM 11/05/2022    2:59 PM 11/05/2022   12:16 PM  Readmission Risk Prevention Plan  Transportation Screening Complete Complete Complete  Medication Review Oceanographer) Complete Complete Complete  PCP or Specialist appointment within  3-5 days of discharge Complete Complete Complete  HRI or Home Care Consult Complete Complete Complete  SW Recovery Care/Counseling Consult Complete Complete Complete  Palliative Care Screening Complete Complete Complete  Skilled Nursing Facility Not Applicable Not Applicable Not Applicable

## 2023-10-22 NOTE — Progress Notes (Signed)
 Physical Therapy Treatment Patient Details Name: Gary [name] MRN: [id number] DOB: [birth year] Today's Date: 10/22/2023   History of Present Illness Patient is 68 y.o. male presenting from home to the ED on 10/11/23 with SoB and abdominal cramping and diarrhea. Treated for with antibiotics for diverticulitis. 7/1 went into acute hypercapnic respiratory failure and was moved to ICU and placed on ventilator for treatment of respiratory failure and metabolic encephalopathy PMH significant for HFpEF (60-65%), AAA, CKD IIIa, COPD, HTN, morbid obesity, gastric ulcers, Tracheostomy in 2023.    PT Comments  Pt supine in bed sound asleep on entry, requiring increased stimulation to rouse. Ultimately, pt able to wake up and work with therapy. Pt has over inflated estimation of his strength and mobility requesting to get up to the walker. He is currently limited in safe mobility by decreased strength especially in his core, which affects his ability to static sit at [name]. Pt is maxAx2 for bed mobility with use of bed features to elevate trunk. Pt with decreased ability to hold upright in sitting with increasing posterior lean which facilitate hips moving closer to the EoB.  Ultimately, pt leans back down across the bed requiring assist of 4 to get him back in the bed due to increased fatigue. D/c plans remain appropriate. PT will continue to follow acutely.    If plan is discharge home, recommend the following: Two people to help with walking and/or transfers;Two people to help with bathing/dressing/bathroom;Assistance with cooking/housework;Assist for transportation;Help with stairs or ramp for entrance   Can travel by private vehicle     No  Equipment Recommendations  None recommended by PT (defer to next venue)       Precautions / Restrictions Precautions Precautions: Fall Recall of Precautions/Restrictions: Intact Restrictions Weight Bearing Restrictions Per Provider Order: No     Mobility  Bed  Mobility Overal bed mobility: Needs Assistance Bed Mobility: Rolling, Supine to Sit, Sit to Supine Rolling: Max assist   Supine to sit: Max assist, +2 for physical assistance Sit to supine: Total assist, +2 for physical assistance   General bed mobility comments: increased time and max assist +2 to get from supine to sitting EOB, due to fatigue from sitting on EOB +4 to return to supine    Transfers                   General transfer comment: deferred due to weakness       Balance Overall balance assessment: Needs assistance Sitting-balance support: Single extremity supported, Bilateral upper extremity supported, Feet supported Sitting balance-[name] Scale: Poor Sitting balance - Comments: sitting on EOB with UE support and quickly fatigued and leaned back on bed and unable to continue to sit on EOB Postural control: Posterior lean     Standing balance comment: unable to attempt                            Communication Communication Communication: No apparent difficulties;Impaired Factors Affecting Communication: Trach/intubated  Cognition Arousal: Alert Behavior During Therapy: WFL for tasks assessed/performed                             Following commands: Intact      Cueing Cueing Techniques: Verbal cues, Gestural cues, Visual cues     General Comments General comments (skin integrity, edema, etc.): pt on trach collar VSS, pt with over estimated of his abilities but  eager to participate      Pertinent Vitals/Pain Pain Assessment Pain Assessment: Faces Faces Pain Scale: Hurts a little bit Pain Location: generalized Pain Descriptors / Indicators: Grimacing, Discomfort Pain Intervention(s): Limited activity within patient's tolerance, Monitored during session, Repositioned     PT Goals (current goals can now be found in the care plan section) Acute Rehab PT Goals PT Goal Formulation: With patient Time For Goal Achievement:  10/27/23 Potential to Achieve Goals: Good Progress towards PT goals: Progressing toward goals    Frequency    Min 1X/week           Co-evaluation   Reason for Co-Treatment: For patient/therapist safety;To address functional/ADL transfers;Complexity of the patient's impairments (multi-system involvement) PT goals addressed during session: Mobility/safety with mobility OT goals addressed during session: ADL's and self-care;Strengthening/ROM      AM-PAC PT 6 Clicks Mobility   Outcome Measure  Help needed turning from your back to your side while in a flat bed without using bedrails?: Total Help needed moving from lying on your back to sitting on the side of a flat bed without using bedrails?: Total Help needed moving to and from a bed to a chair (including a wheelchair)?: Total Help needed standing up from a chair using your arms (e.g., wheelchair or bedside chair)?: Total Help needed to walk in hospital room?: Total Help needed climbing 3-5 steps with a railing? : Total 6 Click Score: 6    End of Session Equipment Utilized During Treatment: Oxygen  (on ventilator) Activity Tolerance: Patient tolerated treatment well Patient left: in bed;with call bell/phone within reach Nurse Communication: Mobility status;Need for lift equipment PT Visit Diagnosis: Other abnormalities of gait and mobility (R26.89);Muscle weakness (generalized) (M62.81);Difficulty in walking, not elsewhere classified (R26.2)     Time: 8559-8486 PT Time Calculation (min) (ACUTE ONLY): 33 min  Charges:    $Therapeutic Activity: 8-22 mins PT General Charges $$ ACUTE PT VISIT: 1 Visit                     Gary [name] PT, DPT Acute Rehabilitation Services Please use secure chat or  Call Office [phone]    Gary [name] 10/22/2023, 4:42 PM

## 2023-10-23 DIAGNOSIS — J9622 Acute and chronic respiratory failure with hypercapnia: Secondary | ICD-10-CM | POA: Diagnosis not present

## 2023-10-23 DIAGNOSIS — G928 Other toxic encephalopathy: Secondary | ICD-10-CM | POA: Diagnosis not present

## 2023-10-23 DIAGNOSIS — J9621 Acute and chronic respiratory failure with hypoxia: Secondary | ICD-10-CM | POA: Diagnosis not present

## 2023-10-23 DIAGNOSIS — Z930 Tracheostomy status: Secondary | ICD-10-CM | POA: Diagnosis not present

## 2023-10-23 LAB — GASTROINTESTINAL PANEL BY PCR, STOOL (REPLACES STOOL CULTURE)

## 2023-10-23 LAB — CBC WITH DIFFERENTIAL/PLATELET
Abs Immature Granulocytes: 0.07 K/uL (ref 0.00–0.07)
Basophils Absolute: 0 K/uL (ref 0.0–0.1)
Basophils Relative: 1 %
Eosinophils Absolute: 0.3 K/uL (ref 0.0–0.5)
Eosinophils Relative: 4 %
HCT: 34.7 % — ABNORMAL LOW (ref 39.0–52.0)
Hemoglobin: 8.7 g/dL — ABNORMAL LOW (ref 13.0–17.0)
Immature Granulocytes: 1 %
Lymphocytes Relative: 19 %
Lymphs Abs: 1.4 K/uL (ref 0.7–4.0)
MCH: 19.5 pg — ABNORMAL LOW (ref 26.0–34.0)
MCHC: 25.1 g/dL — ABNORMAL LOW (ref 30.0–36.0)
MCV: 77.8 fL — ABNORMAL LOW (ref 80.0–100.0)
Monocytes Absolute: 0.9 K/uL (ref 0.1–1.0)
Monocytes Relative: 12 %
Neutro Abs: 4.6 K/uL (ref 1.7–7.7)
Neutrophils Relative %: 63 %
Platelets: 156 K/uL (ref 150–400)
RBC: 4.46 MIL/uL (ref 4.22–5.81)
RDW: 20.2 % — ABNORMAL HIGH (ref 11.5–15.5)
WBC: 7.3 K/uL (ref 4.0–10.5)
nRBC: 0.3 % — ABNORMAL HIGH (ref 0.0–0.2)

## 2023-10-23 LAB — BASIC METABOLIC PANEL WITH GFR
Anion gap: 9 (ref 5–15)
BUN: 23 mg/dL (ref 8–23)
CO2: 30 mmol/L (ref 22–32)
Calcium: 8.3 mg/dL — ABNORMAL LOW (ref 8.9–10.3)
Chloride: 102 mmol/L (ref 98–111)
Creatinine, Ser: 1.35 mg/dL — ABNORMAL HIGH (ref 0.61–1.24)
GFR, Estimated: 58 mL/min — ABNORMAL LOW (ref >60)
Glucose, Bld: 94 mg/dL (ref 70–99)
Potassium: 3.8 mmol/L (ref 3.5–5.1)
Sodium: 141 mmol/L (ref 135–145)

## 2023-10-23 LAB — GLUCOSE, CAPILLARY
Glucose-Capillary: 98 mg/dL (ref 70–99)
Glucose-Capillary: 84 mg/dL (ref 70–99)

## 2023-10-23 LAB — MAGNESIUM: Magnesium: 1.6 mg/dL — ABNORMAL LOW (ref 1.7–2.4)

## 2023-10-23 MED ORDER — TORSEMIDE 20 MG PO TABS
40.0000 mg | ORAL_TABLET | Freq: Every day | ORAL | Status: DC
  Administered 2023-10-24 – 2023-10-26 (×3): 40 mg via ORAL
  Filled 2023-10-23 (×3): qty 2

## 2023-10-23 MED ORDER — LORAZEPAM 1 MG PO TABS: 0.5000 mg | ORAL_TABLET | Freq: Two times a day (BID) | ORAL | Status: DC | PRN

## 2023-10-23 MED ORDER — MAGNESIUM SULFATE 4 GM/100ML IV SOLN
4.0000 g | Freq: Once | INTRAVENOUS | Status: AC
  Administered 2023-10-23: 4 g via INTRAVENOUS
  Filled 2023-10-23: qty 100

## 2023-10-23 NOTE — TOC Progression Note (Signed)
 Transition of Care [facility]) - Progression Note    Patient Details  Name: Gary [name] MRN: [id number] Date of Birth: 16-Jan-1956  Transition of Care [facility]) CM/SW Contact  Tom-Johnson, Harvest Muskrat, RN Phone Number: 10/23/2023, 12:53 PM  Clinical Narrative:     CM informed by Tinnie with [facility] that an Expedited Appeal Request will be done. MD filled out form and signed, CM emailed signed form to [employer].  TOC working on both LTAC and SNF for discharge disposition.  CM will continue to follow as patient progresses with care towards discharge.        Expected Discharge Plan: Long Term Nursing Home Barriers to Discharge: Continued Medical Work up, English as a second language teacher, SNF Pending bed offer  Expected Discharge Plan and Services In-house Referral: Clinical Social Work Discharge Planning Services: [employer] Consult Post Acute Care Choice: Skilled Nursing Facility Living arrangements for the past 2 months: Apartment                                       Social Determinants of Health (SDOH) Interventions SDOH Screenings   Food Insecurity: No Food Insecurity (10/23/2023)  Housing: High Risk (10/23/2023)  Transportation Needs: No Transportation Needs (10/23/2023)  Utilities: Not At Risk (10/23/2023)  Depression (PHQ2-9): Low Risk  (06/07/2021)  Financial Resource Strain: High Risk (11/21/2022)   Received from [facility]  Physical Activity: Insufficiently Active (05/03/2017)  Social Connections: Moderately Isolated (10/23/2023)  Stress: Stress Concern Present (11/20/2022)   Received from [facility]  Tobacco Use: Medium Risk (10/11/2023)    Readmission Risk Interventions    03/19/2023    9:58 AM 11/05/2022    2:59 PM 11/05/2022   12:16 PM  Readmission Risk Prevention Plan  Transportation Screening Complete Complete Complete  Medication Review Oceanographer) Complete Complete Complete  PCP or Specialist appointment within 3-5 days of discharge Complete Complete Complete   HRI or Home Care Consult Complete Complete Complete  SW Recovery Care/Counseling Consult Complete Complete Complete  Palliative Care Screening Complete Complete Complete  Skilled Nursing Facility Not Applicable Not Applicable Not Applicable

## 2023-10-23 NOTE — Plan of Care (Signed)
   Problem: Coping: Goal: Ability to adjust to condition or change in health will improve Outcome: Progressing   Problem: Fluid Volume: Goal: Ability to maintain a balanced intake and output will improve Outcome: Progressing   Problem: Skin Integrity: Goal: Risk for impaired skin integrity will decrease Outcome: Progressing   Problem: Tissue Perfusion: Goal: Adequacy of tissue perfusion will improve Outcome: Progressing
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
## 2023-10-23 NOTE — TOC Progression Note (Addendum)
 Transition of Care [facility]) - Progression Note    Patient Details  Name: Gary [name] MRN: [id number] Date of Birth: 1956-03-24  [facility]) CM/SW Contact  [name], LCSW Phone Number: 10/23/2023, 9:39 AM  Clinical Narrative:     9:39 AM [facility] informed CSW that they currently do not have male vent bed availability. [facility] informed CSW that they are not in network with patient's insurance. [address] [facility] [place] informed CSW that they do not have bariatric bed availability at this time. CSW relayed information to medical team. CSW expanded SNF search to [place]  and sent referrals to [facility] ([place]), [facility]), [facility] ([place]), [facility] ([place]), [facility] ([place]), [facility] ([place]), and [facility]). [facility], [facility], [facility], and [facility] admissions informed CSW that they also do not have bariatric bed availability. CSW is awaiting bed decisions from [facility] ([place]), [address] ([place]) and [facility] [place]). CSW provided SDOH (housing) resources.  Expected Discharge Plan: Long Term Nursing Home Barriers to Discharge: Continued Medical Work up, English as a second language teacher, SNF Pending bed offer  Expected Discharge Plan and Services In-house Referral: Clinical Social Work Discharge Planning Services: [employer] Consult Post Acute Care Choice: Skilled Nursing Facility Living arrangements for the past 2 months: Apartment                                       Social Determinants of Health (SDOH) Interventions SDOH Screenings   Food Insecurity: No Food Insecurity (10/23/2023)  Housing: High Risk (10/23/2023)  Transportation Needs: No Transportation Needs (10/23/2023)  Utilities: Not At Risk (10/23/2023)  Depression (PHQ2-9): Low Risk  (06/07/2021)  Financial  Resource Strain: High Risk (11/21/2022)   [facility] from [facility]  Physical Activity: Insufficiently Active (05/03/2017)  Social Connections: Moderately Isolated (10/23/2023)  Stress: Stress Concern Present (11/20/2022)   Received from [facility]  Tobacco Use: Medium Risk (10/11/2023)    Readmission Risk Interventions    03/19/2023    9:58 AM 11/05/2022    2:59 PM 11/05/2022   12:16 PM  Readmission Risk Prevention Plan  Transportation Screening Complete Complete Complete  Medication Review [employer]) Complete Complete Complete  PCP or Specialist appointment within 3-5 days of discharge Complete Complete Complete  HRI or Home Care Consult Complete Complete Complete  SW Recovery Care/Counseling Consult Complete Complete Complete  Palliative Care Screening Complete Complete Complete  Skilled Nursing Facility Not Applicable Not Applicable Not Applicable

## 2023-10-24 DIAGNOSIS — G928 Other toxic encephalopathy: Secondary | ICD-10-CM | POA: Diagnosis not present

## 2023-10-24 DIAGNOSIS — J9621 Acute and chronic respiratory failure with hypoxia: Secondary | ICD-10-CM | POA: Diagnosis not present

## 2023-10-24 DIAGNOSIS — J9622 Acute and chronic respiratory failure with hypercapnia: Secondary | ICD-10-CM | POA: Diagnosis not present

## 2023-10-24 DIAGNOSIS — Z930 Tracheostomy status: Secondary | ICD-10-CM | POA: Diagnosis not present

## 2023-10-24 NOTE — TOC Progression Note (Signed)
 Transition of Care [facility]) - Progression Note    Patient Details  Name: Gary [name] MRN: [id number] Date of Birth: [birth year]  Transition of Care [facility] CM/SW Contact  [name], LCSW Phone Number: 10/24/2023, 9:53 AM  Clinical Narrative:     9:53 AM [facility] and [facility]) SNFs declined bed offer. CSW attempted to contact [facility] ([place]) SNF admissions, but there was no response and a voicemail was left.  Expected Discharge Plan: Long Term Nursing Home Barriers to Discharge: Continued Medical Work up, English as a second language teacher, SNF Pending bed offer  Expected Discharge Plan and Services In-house Referral: Clinical Social Work Discharge Planning Services: [employer] Consult Post Acute Care Choice: Skilled Nursing Facility Living arrangements for the past 2 months: Apartment                                       Social Determinants of Health (SDOH) Interventions SDOH Screenings   Food Insecurity: No Food Insecurity (10/23/2023)  Housing: High Risk (10/23/2023)  Transportation Needs: No Transportation Needs (10/23/2023)  Utilities: Not At Risk (10/23/2023)  Depression (PHQ2-9): Low Risk  (06/07/2021)  Financial Resource Strain: High Risk (11/21/2022)   Received from [facility]  Physical Activity: Insufficiently Active (05/03/2017)  Social Connections: Moderately Isolated (10/23/2023)  Stress: Stress Concern Present (11/20/2022)   Received from [facility]  Tobacco Use: Medium Risk (10/11/2023)    Readmission Risk Interventions    03/19/2023    9:58 AM 11/05/2022    2:59 PM 11/05/2022   12:16 PM  Readmission Risk Prevention Plan  Transportation Screening Complete Complete Complete  Medication Review Oceanographer) Complete Complete Complete  PCP or Specialist appointment within 3-5 days of discharge Complete Complete Complete  HRI or Home Care Consult Complete Complete Complete  SW Recovery Care/Counseling Consult  Complete Complete Complete  Palliative Care Screening Complete Complete Complete  Skilled Nursing Facility Not Applicable Not Applicable Not Applicable

## 2023-10-24 NOTE — Plan of Care (Signed)
  Problem: Tissue Perfusion: Goal: Adequacy of tissue perfusion will improve Outcome: Progressing   Problem: Education: Goal: Knowledge of General Education information will improve Description: Including pain rating scale, medication(s)/side effects and non-pharmacologic comfort measures Outcome: Progressing   Problem: Clinical Measurements: Goal: Will remain free from infection Outcome: Progressing   Problem: Clinical Measurements: Goal: Respiratory complications will improve Outcome: Progressing   Problem: Activity: Goal: Risk for activity intolerance will decrease Outcome: Progressing
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
## 2023-10-25 DIAGNOSIS — Z930 Tracheostomy status: Secondary | ICD-10-CM | POA: Diagnosis not present

## 2023-10-25 DIAGNOSIS — G928 Other toxic encephalopathy: Secondary | ICD-10-CM | POA: Diagnosis not present

## 2023-10-25 DIAGNOSIS — J9622 Acute and chronic respiratory failure with hypercapnia: Secondary | ICD-10-CM | POA: Diagnosis not present

## 2023-10-25 DIAGNOSIS — J9621 Acute and chronic respiratory failure with hypoxia: Secondary | ICD-10-CM | POA: Diagnosis not present

## 2023-10-25 LAB — BASIC METABOLIC PANEL WITH GFR
Anion gap: 10 (ref 5–15)
BUN: 19 mg/dL (ref 8–23)
CO2: 30 mmol/L (ref 22–32)
Calcium: 8.1 mg/dL — ABNORMAL LOW (ref 8.9–10.3)
Chloride: 100 mmol/L (ref 98–111)
Creatinine, Ser: 1.29 mg/dL — ABNORMAL HIGH (ref 0.61–1.24)
GFR, Estimated: >60 mL/min (ref >60)
Glucose, Bld: 145 mg/dL — ABNORMAL HIGH (ref 70–99)
Potassium: 3.1 mmol/L — ABNORMAL LOW (ref 3.5–5.1)
Sodium: 140 mmol/L (ref 135–145)

## 2023-10-25 LAB — CBC
HCT: 31.9 % — ABNORMAL LOW (ref 39.0–52.0)
Hemoglobin: 8.3 g/dL — ABNORMAL LOW (ref 13.0–17.0)
MCH: 19.8 pg — ABNORMAL LOW (ref 26.0–34.0)
MCHC: 26 g/dL — ABNORMAL LOW (ref 30.0–36.0)
MCV: 76.1 fL — ABNORMAL LOW (ref 80.0–100.0)
Platelets: 197 K/uL (ref 150–400)
RBC: 4.19 MIL/uL — ABNORMAL LOW (ref 4.22–5.81)
RDW: 19.6 % — ABNORMAL HIGH (ref 11.5–15.5)
WBC: 7.8 K/uL (ref 4.0–10.5)
nRBC: 0 % (ref 0.0–0.2)

## 2023-10-25 LAB — GLUCOSE, CAPILLARY: Glucose-Capillary: 103 mg/dL — ABNORMAL HIGH (ref 70–99)

## 2023-10-25 LAB — MAGNESIUM: Magnesium: 1.4 mg/dL — ABNORMAL LOW (ref 1.7–2.4)

## 2023-10-25 MED ORDER — POTASSIUM CHLORIDE CRYS ER 20 MEQ PO TBCR
40.0000 meq | EXTENDED_RELEASE_TABLET | ORAL | Status: AC
  Administered 2023-10-25 (×3): 40 meq via ORAL
  Filled 2023-10-25 (×3): qty 2

## 2023-10-25 MED ORDER — MAGNESIUM SULFATE 4 GM/100ML IV SOLN
4.0000 g | Freq: Once | INTRAVENOUS | Status: AC
  Administered 2023-10-25: 4 g via INTRAVENOUS
  Filled 2023-10-25: qty 100

## 2023-10-25 NOTE — Progress Notes (Signed)
 Occupational Therapy Treatment Patient Details Name: Gary [name] MRN: [id number] DOB: July 12, 1955 Today's Date: 10/25/2023   History of present illness Patient is 68 y.o. male presenting from home to the ED on 10/11/23 with SoB and abdominal cramping and diarrhea. Treated for with antibiotics for diverticulitis. 7/1 went into acute hypercapnic respiratory failure and was moved to ICU and placed on ventilator for treatment of respiratory failure and metabolic encephalopathy PMH significant for HFpEF (60-65%), AAA, CKD IIIa, COPD, HTN, morbid obesity, gastric ulcers, Tracheostomy in 2023.   OT comments  Patient received in supine and agreeable to OT/PT treatment but declined OOB to recliner due to bouts of diarrhea. Patient instructed on bed mobility with patient able to get to EOB with mod assist. Patient able to stand from raised bed x2 with mod assist +2. Patient demonstrating limited standing tolerance and increased time to recover. Patient was max assist +2 to return to supine. Discharge recommendations continue to be appropriate. Acute OT to continue to follow to address established goals to facilitate DC to next venue of care.        If plan is discharge home, recommend the following:  A lot of help with walking and/or transfers;A lot of help with bathing/dressing/bathroom;Assistance with cooking/housework;Help with stairs or ramp for entrance;Assist for transportation;Two people to help with walking and/or transfers   Equipment Recommendations  Other (comment) (TBD pending progress)    Recommendations for Other Services      Precautions / Restrictions Precautions Precautions: Fall Recall of Precautions/Restrictions: Intact Restrictions Weight Bearing Restrictions Per Provider Order: No       Mobility Bed Mobility Overal bed mobility: Needs Assistance Bed Mobility: Supine to Sit, Sit to Supine     Supine to sit: HOB elevated, Used rails, Mod assist Sit to supine: Max assist,  +2 for physical assistance   General bed mobility comments: patient able to assist more with supine to sitting on EOB with mod assist for safety with air mattress. Assistance with BLE and trunk to return to supine    Transfers Overall transfer level: Needs assistance Equipment used: Rolling walker (2 wheels) (bariatric) Transfers: Sit to/from Stand Sit to Stand: Mod assist, +2 physical assistance, From elevated surface           General transfer comment: 2 stands performed from elevated bed  with mod assist +2     Balance Overall balance assessment: Needs assistance Sitting-balance support: Single extremity supported, Feet supported Sitting balance-[name] Scale: Poor Sitting balance - Comments: cues to prevent hip extension and posture   Standing balance support: Bilateral upper extremity supported Standing balance-[name] Scale: Poor Standing balance comment: stood x2 with mod assist +2 and limited standing tolerance with heavy reliance on RW                           ADL either performed or assessed with clinical judgement   ADL Overall ADL's : Needs assistance/impaired Eating/Feeding: Set up   Grooming: Set up;Bed level;Wash/dry face                                 General ADL Comments: focused on bed mobility and sit to stands to progress with functional transfers    Extremity/Trunk Assessment              Vision       Perception     Praxis  Communication Communication Communication: No apparent difficulties;Impaired Factors Affecting Communication: Trach/intubated   Cognition Arousal: Alert Behavior During Therapy: WFL for tasks assessed/performed Cognition: No apparent impairments             OT - Cognition Comments: patient attempts to lead sesson                 Following commands: Intact        Cueing   Cueing Techniques: Verbal cues, Gestural cues, Visual cues  Exercises      Shoulder Instructions        General Comments VSS on trach collar    Pertinent Vitals/ Pain       Pain Assessment Pain Assessment: Faces Faces Pain Scale: Hurts a little bit Pain Location: generalized Pain Descriptors / Indicators: Grimacing, Discomfort Pain Intervention(s): Limited activity within patient's tolerance, Monitored during session, Repositioned  Home Living                                          Prior Functioning/Environment              Frequency  Min 2X/week        Progress Toward Goals  OT Goals(current goals can now be found in the care plan section)  Progress towards OT goals: Progressing toward goals  Acute Rehab OT Goals Patient Stated Goal: to walk OT Goal Formulation: With patient Time For Goal Achievement: 10/28/23 Potential to Achieve Goals: Good ADL Goals Pt Will Perform Lower Body Dressing: with mod assist;sit to/from stand Pt Will Transfer to Toilet: Independently;stand pivot transfer Additional ADL Goal #1: Pt will complete bed mobility with supervision A as a precursor to ADLs Additional ADL Goal #2: Pt will tolerate at least 10 minutes of unsupported sitting to prepare for EOB ADLs  Plan      Co-evaluation    PT/OT/SLP Co-Evaluation/Treatment: Yes Reason for Co-Treatment: For patient/therapist safety;To address functional/ADL transfers;Complexity of the patient's impairments (multi-system involvement) PT goals addressed during session: Mobility/safety with mobility OT goals addressed during session: Strengthening/ROM      AM-PAC OT 6 Clicks Daily Activity     Outcome Measure   Help from another person eating meals?: A Little Help from another person taking care of personal grooming?: A Little Help from another person toileting, which includes using toliet, bedpan, or urinal?: Total Help from another person bathing (including washing, rinsing, drying)?: A Lot Help from another person to put on and taking off regular upper body  clothing?: A Lot Help from another person to put on and taking off regular lower body clothing?: Total 6 Click Score: 12    End of Session Equipment Utilized During Treatment: Gait belt;Rolling walker (2 wheels) (bariatric RW)  OT Visit Diagnosis: Unsteadiness on feet (R26.81);Other abnormalities of gait and mobility (R26.89);Muscle weakness (generalized) (M62.81)   Activity Tolerance Patient tolerated treatment well   Patient Left in bed;with call bell/phone within reach;with nursing/sitter in room   Nurse Communication Mobility status        Time: 8742-8674 OT Time Calculation (min): 28 min  Charges: OT General Charges $OT Visit: 1 Visit OT Treatments $Therapeutic Activity: 8-22 mins  Dick [name], OTA Acute Rehabilitation Services  Office [phone]   Gary [name] 10/25/2023, 2:03 PM

## 2023-10-25 NOTE — Progress Notes (Signed)
 Physical Therapy Treatment Patient Details Name: Gary [name] MRN: [id number] DOB: 1956-03-11 Today's Date: 10/25/2023   History of Present Illness Patient is 68 y.o. male presenting from home to the ED on 10/11/23 with SoB and abdominal cramping and diarrhea. Treated for with antibiotics for diverticulitis. 7/1 went into acute hypercapnic respiratory failure and was moved to ICU and placed on ventilator for treatment of respiratory failure and metabolic encephalopathy PMH significant for HFpEF (60-65%), AAA, CKD IIIa, COPD, HTN, morbid obesity, gastric ulcers, Tracheostomy in 2023.    PT Comments  Pt supine in bed on entry finishing his lunch. Pt refuses attempt to get to recliner do to ongoing diarrhea. Pt agreeable to standing at the EoB. Pt very adamant about extremely elevated be height to come to standing refusing to attempt. Pt reports the only way that he can get up to standing is if the bed is very high and he can use momentum to lift off bed and then locks out knees and then elbows to maintain upright. Pt uninterested in cues for muscle activation. Did not attempt stepping due to patient extreme weakness and strategy to stay standing. Pt is modAx2 to come to standing twice from extremely elevated bed. After second attempt pt request to return to supine due to fatigue. Pt requires maxAx2 for return to supine. D/c plan remains appropriate at this time. PT will continue to follow acutely.     If plan is discharge home, recommend the following: Two people to help with walking and/or transfers;Two people to help with bathing/dressing/bathroom;Assistance with cooking/housework;Assist for transportation;Help with stairs or ramp for entrance   Can travel by private vehicle     No  Equipment Recommendations  None recommended by PT (defer to next venue)       Precautions / Restrictions Precautions Precautions: Fall Recall of Precautions/Restrictions: Intact Restrictions Weight Bearing  Restrictions Per Provider Order: No     Mobility  Bed Mobility Overal bed mobility: Needs Assistance Bed Mobility: Supine to Sit, Sit to Supine     Supine to sit: HOB elevated, Used rails, Mod assist Sit to supine: Max assist, +2 for physical assistance   General bed mobility comments: patient able to assist more with supine to sitting on EOB with mod assist for safety with air mattress. Assistance with BLE and trunk to return to supine    Transfers Overall transfer level: Needs assistance Equipment used: Rolling walker (2 wheels) (bariatric) Transfers: Sit to/from Stand Sit to Stand: Mod assist, +2 physical assistance, From elevated surface           General transfer comment: 2 stands performed from elevated bed  with mod assist +2          Balance Overall balance assessment: Needs assistance Sitting-balance support: Single extremity supported, Feet supported Sitting balance-[name] Scale: Poor Sitting balance - Comments: cues to prevent hip extension and posture   Standing balance support: Bilateral upper extremity supported Standing balance-[name] Scale: Poor Standing balance comment: stood x2 with mod assist +2 and limited standing tolerance with heavy reliance on RW                            Communication Communication Communication: No apparent difficulties;Impaired Factors Affecting Communication: Trach/intubated  Cognition Arousal: Alert Behavior During Therapy: WFL for tasks assessed/performed  Following commands: Intact      Cueing Cueing Techniques: Verbal cues, Gestural cues, Visual cues     General Comments General comments (skin integrity, edema, etc.): VSS on 10L O2 via  trach collar      Pertinent Vitals/Pain Pain Assessment Pain Assessment: Faces Faces Pain Scale: Hurts a little bit Pain Location: generalized Pain Descriptors / Indicators: Grimacing, Discomfort Pain Intervention(s):  Limited activity within patient's tolerance, Monitored during session, Repositioned     PT Goals (current goals can now be found in the care plan section) Acute Rehab PT Goals PT Goal Formulation: With patient Time For Goal Achievement: 10/27/23 Potential to Achieve Goals: Good Progress towards PT goals: Progressing toward goals    Frequency    Min 1X/week           Co-evaluation   Reason for Co-Treatment: For patient/therapist safety;To address functional/ADL transfers;Complexity of the patient's impairments (multi-system involvement) PT goals addressed during session: Mobility/safety with mobility OT goals addressed during session: Strengthening/ROM      AM-PAC PT 6 Clicks Mobility   Outcome Measure  Help needed turning from your back to your side while in a flat bed without using bedrails?: Total Help needed moving from lying on your back to sitting on the side of a flat bed without using bedrails?: Total Help needed moving to and from a bed to a chair (including a wheelchair)?: Total Help needed standing up from a chair using your arms (e.g., wheelchair or bedside chair)?: Total Help needed to walk in hospital room?: Total Help needed climbing 3-5 steps with a railing? : Total 6 Click Score: 6    End of Session Equipment Utilized During Treatment: Oxygen  (on ventilator) Activity Tolerance: Patient tolerated treatment well Patient left: in bed;with call bell/phone within reach Nurse Communication: Mobility status;Need for lift equipment PT Visit Diagnosis: Other abnormalities of gait and mobility (R26.89);Muscle weakness (generalized) (M62.81);Difficulty in walking, not elsewhere classified (R26.2)     Time: 8742-8674 PT Time Calculation (min) (ACUTE ONLY): 28 min  Charges:    $Therapeutic Activity: 8-22 mins PT General Charges $$ ACUTE PT VISIT: 1 Visit                     Shawnee Higham B. [name] PT, DPT Acute Rehabilitation Services Please use secure  chat or  Call Office [phone]    Almarie KATHEE Fleeta [facility] 10/25/2023, 2:53 PM

## 2023-10-25 NOTE — TOC Progression Note (Addendum)
 Transition of Care [facility]) - Progression Note    Patient Details  Name: Gary [name] MRN: [id number] Date of Birth: Apr 16, 1956  Transition of Care [facility]) CM/SW Contact  [name], LCSW Phone Number: 10/25/2023, 11:55 AM  Clinical Narrative:     11:55 AM CSW attempted to contact [facility] [place] SNF admissions, but there was no response and a voicemail was left. CSW updated medical team on patient's lack of SNF bed offers due to medical and bariatric needs.  Expected Discharge Plan: Long Term Nursing Home Barriers to Discharge: Continued Medical Work up, English as a second language teacher, SNF Pending bed offer  Expected Discharge Plan and Services In-house Referral: Clinical Social Work Discharge Planning Services: [employer] Consult Post Acute Care Choice: Skilled Nursing Facility Living arrangements for the past 2 months: Apartment                                       Social Determinants of Health (SDOH) Interventions SDOH Screenings   Food Insecurity: No Food Insecurity (10/23/2023)  Housing: High Risk (10/23/2023)  Transportation Needs: No Transportation Needs (10/23/2023)  Utilities: Not At Risk (10/23/2023)  Depression (PHQ2-9): Low Risk  (06/07/2021)  Financial Resource Strain: High Risk (11/21/2022)   [facility]  Physical Activity: Insufficiently Active (05/03/2017)  Social Connections: Moderately Isolated (10/23/2023)  Stress: Stress Concern Present (11/20/2022)   Received from [facility]  Tobacco Use: Medium Risk (10/11/2023)    Readmission Risk Interventions    03/19/2023    9:58 AM 11/05/2022    2:59 PM 11/05/2022   12:16 PM  Readmission Risk Prevention Plan  Transportation Screening Complete Complete Complete  Medication Review Oceanographer) Complete Complete Complete  PCP or Specialist appointment within 3-5 days of discharge Complete Complete Complete  HRI or Home Care Consult Complete Complete Complete  SW Recovery  Care/Counseling Consult Complete Complete Complete  Palliative Care Screening Complete Complete Complete  Skilled Nursing Facility Not Applicable Not Applicable Not Applicable
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
## 2023-10-26 DIAGNOSIS — J9622 Acute and chronic respiratory failure with hypercapnia: Secondary | ICD-10-CM | POA: Diagnosis not present

## 2023-10-26 DIAGNOSIS — J9602 Acute respiratory failure with hypercapnia: Secondary | ICD-10-CM | POA: Diagnosis not present

## 2023-10-26 DIAGNOSIS — R197 Diarrhea, unspecified: Secondary | ICD-10-CM | POA: Diagnosis present

## 2023-10-26 DIAGNOSIS — Z7401 Bed confinement status: Secondary | ICD-10-CM | POA: Diagnosis not present

## 2023-10-26 DIAGNOSIS — J9601 Acute respiratory failure with hypoxia: Secondary | ICD-10-CM | POA: Diagnosis not present

## 2023-10-26 DIAGNOSIS — J151 Pneumonia due to Pseudomonas: Secondary | ICD-10-CM | POA: Insufficient documentation

## 2023-10-26 DIAGNOSIS — J9621 Acute and chronic respiratory failure with hypoxia: Secondary | ICD-10-CM

## 2023-10-26 DIAGNOSIS — K5792 Diverticulitis of intestine, part unspecified, without perforation or abscess without bleeding: Secondary | ICD-10-CM | POA: Diagnosis present

## 2023-10-26 DIAGNOSIS — B965 Pseudomonas (aeruginosa) (mallei) (pseudomallei) as the cause of diseases classified elsewhere: Secondary | ICD-10-CM | POA: Insufficient documentation

## 2023-10-26 LAB — BASIC METABOLIC PANEL WITH GFR
Anion gap: 10 (ref 5–15)
BUN: 22 mg/dL (ref 8–23)
CO2: 27 mmol/L (ref 22–32)
Calcium: 8.2 mg/dL — ABNORMAL LOW (ref 8.9–10.3)
Chloride: 103 mmol/L (ref 98–111)
Creatinine, Ser: 1.35 mg/dL — ABNORMAL HIGH (ref 0.61–1.24)
GFR, Estimated: 58 mL/min — ABNORMAL LOW (ref >60)
Glucose, Bld: 140 mg/dL — ABNORMAL HIGH (ref 70–99)
Potassium: 3.9 mmol/L (ref 3.5–5.1)
Sodium: 140 mmol/L (ref 135–145)

## 2023-10-26 LAB — GLUCOSE, CAPILLARY: Glucose-Capillary: 122 mg/dL — ABNORMAL HIGH (ref 70–99)

## 2023-10-26 LAB — MAGNESIUM: Magnesium: 1.9 mg/dL (ref 1.7–2.4)

## 2023-10-26 MED ORDER — SODIUM CHLORIDE 0.9 % IV SOLN: 2.0000 g | Freq: Three times a day (TID) | INTRAVENOUS | Status: AC

## 2023-10-26 NOTE — Progress Notes (Addendum)
 Progress Note    SANTIEL TOPPER  FMW:[id number] DOB: 06-Mar-1956  DOA: 10/11/2023 PCP: No primary care provider on file.      Brief Narrative:    Medical records reviewed and are as summarized below:  NIMESH RIOLO is a 68 y.o. male with past medical history significant for heart failure preserved ejection fraction, AAA, COPD, hypertension, anemia, chronic trach (Shiley XLT #7 ), who presented to the ED on 6/27 with abdominal pain and diarrhea.  He was diagnosed with diverticulitis and he apparently was discharged home but family refused to take him and said they could not care for him anymore.  So he was taken back to the ED and has been in the ED in observation since then.  He apparently is not on a vent and he was using trilogy NIV at night and passive +/- trach capped during the day.  He was not getting NIV at night, he developed shortness of breath.  He was found to be hypercarbic and placed on the vent and CCM was called for admission and management of vent.  In the ED his PCO2 was 119.  He had new infiltrate on chest x-ray.  He was admitted and treated for pneumonia.   7/1: admit to ICU 7/2 Weaning sedation, ABG correcting, plan to wean to trache collar  7/3 Increased agitation overnight, propofol  added back  7/4 remains on ventilator via tracheostomy with no acute issues overnight 7/5 no issues overnight, remains on ventilator with decreased settings 7/6 placed back on vent overnight  7/7 PMV and PO intake  7/8 PMV this AM, tolerating PO intake. Vented overnight 7/9 PMV in AM, having breakfast. 7/10 awake and comfortable. NO complaints. Refused blood draws. 7/11 used BiPAP overnight (8/8) for 4 hours. Refused blood draws.    Patient was transferred to [facility] hospitalist service on 10/26/2023.  Chart reviewed.     Assessment/Plan:   Active Problems:   Acute on chronic respiratory failure with hypoxia and hypercapnia ([name])   (HFpEF) heart failure with preserved  ejection fraction ([name])   Acute hypoxemic respiratory failure ([name])   Acute hypercapnic respiratory failure ([name])   Bacteremia due to Pseudomonas   Pseudomonas pneumonia ([name])   Nutrition Problem: Inadequate oral intake Etiology: acute illness  Signs/Symptoms: NPO status   Body mass index is 52.06 kg/m.  (Morbid obesity)    Acute on chronic hypoxic and hypercapnic respiratory failure, OHS, OSA, morbid obesity: Tracheostomy dependent.  Continue oxygen  therapy via tracheostomy and BiPAP at night.   Acute toxic metabolic encephalopathy in the setting of hypercapnia: Mental status has improved.   Sepsis secondary to Pseudomonas bacteremia, Pseudomonas pneumonia, Neisseria mucosa bacteremia: Sepsis physiology has resolved.  Continue IV cefepime  through 10/28/2023 to complete 14 days of treatment.   Chronic HFpEF: Compensated.  Continue torsemide  and spironolactone    AKI on CKD stage IIIa: Creatinine appears close to baseline.   Comorbidities include anxiety, history of DVT (no longer on Eliquis ) history of GI bleed, chronic anemia       Diet Order             Diet regular Room service appropriate? Yes with Assist; Fluid consistency: Thin  Diet effective now                            Consultants: Intensivist  Procedures: Bronchoscopy on 10/17/2023 Cortrak placement on 10/16/2023    Medications:    arformoterol   15 mcg  Nebulization BID   budesonide  (PULMICORT ) nebulizer solution  0.25 mg Nebulization BID   Chlorhexidine  Gluconate Cloth  6 each Topical Daily   clotrimazole    Topical BID   enoxaparin  (LOVENOX ) injection  0.5 mg/kg Subcutaneous Q24H   fiber  1 packet Oral BID   pantoprazole   40 mg Oral QHS   spironolactone   25 mg Oral Daily   torsemide   40 mg Oral Daily   Continuous Infusions:  ceFEPime  (MAXIPIME ) IV Stopped (10/26/23 9380)     Anti-infectives (From admission, onward)    Start     Dose/Rate Route Frequency Ordered Stop    10/21/23 1400  ceFEPIme  (MAXIPIME ) 2 g in sodium chloride  0.9 % 100 mL IVPB  Status:  Discontinued        2 g 200 mL/hr over 30 Minutes Intravenous Every 8 hours 10/21/23 1105 10/21/23 1108   10/21/23 1400  ceFEPIme  (MAXIPIME ) 2 g in sodium chloride  0.9 % 100 mL IVPB        2 g 200 mL/hr over 30 Minutes Intravenous Every 8 hours 10/21/23 1108 10/30/23 2159   10/16/23 2200  cefTRIAXone  (ROCEPHIN ) 2 g in sodium chloride  0.9 % 100 mL IVPB  Status:  Discontinued        2 g 200 mL/hr over 30 Minutes Intravenous Every 24 hours 10/16/23 0401 10/16/23 1928   10/16/23 2200  meropenem  (MERREM ) 2 g in sodium chloride  0.9 % 100 mL IVPB  Status:  Discontinued        2 g 46.7 mL/hr over 180 Minutes Intravenous Every 8 hours 10/16/23 2104 10/21/23 1105   10/16/23 2015  ceFEPIme  (MAXIPIME ) 2 g in sodium chloride  0.9 % 100 mL IVPB  Status:  Discontinued        2 g 25 mL/hr over 240 Minutes Intravenous Every 8 hours 10/16/23 1928 10/16/23 2104   10/16/23 1000  metroNIDAZOLE  (FLAGYL ) IVPB 500 mg  Status:  Discontinued        500 mg 100 mL/hr over 60 Minutes Intravenous Every 12 hours 10/16/23 0401 10/17/23 0741   10/15/23 2015  cefTRIAXone  (ROCEPHIN ) 2 g in sodium chloride  0.9 % 100 mL IVPB        2 g 200 mL/hr over 30 Minutes Intravenous  Once 10/15/23 2000 10/15/23 2201   10/15/23 2015  metroNIDAZOLE  (FLAGYL ) IVPB 500 mg        500 mg 100 mL/hr over 60 Minutes Intravenous  Once 10/15/23 2000 10/15/23 2259   10/11/23 0000  amoxicillin -clavulanate (AUGMENTIN ) 875-125 MG tablet        1 tablet Oral 2 times daily 10/11/23 1849 10/18/23 2359              Family Communication/Anticipated D/C date and plan/Code Status   DVT prophylaxis: SCDs Start: 10/15/23 2254     Code Status: Full Code  Family Communication: None Disposition Plan: Plan to discharge home   Status is: Inpatient Remains inpatient appropriate because: Pneumonia and bacteremia       Subjective:   Interval events  noted.  He has no complaints.  He feels better.  Objective:    Vitals:   10/26/23 0806 10/26/23 1000 10/26/23 1014 10/26/23 1126  BP:   102/64   Pulse:   81 82  Resp:   14 (!) 27  Temp: 98.4 F (36.9 C)   98.1 F (36.7 C)  TempSrc:    Oral  SpO2:  92% 94% 100%  Weight:      Height:  No data found.   Intake/Output Summary (Last 24 hours) at 10/26/2023 1206 Last data filed at 10/26/2023 1000 Gross per 24 hour  Intake 645.36 ml  Output 2200 ml  Net -1554.64 ml   Filed Weights   10/21/23 0500 10/23/23 0438 10/26/23 0502  Weight: (!) 193.8 kg (!) 194.8 kg (!) 194 kg    Exam:  GEN: NAD SKIN: Warm and dry EYES: No pallor or icterus ENT: MMM, trach in place, he is able to speak with Passy-Muir valve CV: RRR PULM: CTA B ABD: soft, obese, NT, +BS CNS: AAO x 3, non focal EXT: No edema or tenderness        Data Reviewed:   I have personally reviewed following labs and imaging studies:  Labs: Labs show the following:   Basic Metabolic Panel: Recent Labs  Lab 10/22/23 0946 10/22/23 0947 10/23/23 0543 10/25/23 0917 10/26/23 0858  NA 141 140 141 140 140  K 4.0 4.1 3.8 3.1* 3.9  CL 106 104 102 100 103  CO2 28 27 30 30 27   GLUCOSE 111* 112* 94 145* 140*  BUN 20 20 23 19 22   CREATININE 1.17 1.15 1.35* 1.29* 1.35*  CALCIUM  8.5* 8.4* 8.3* 8.1* 8.2*  MG  --   --  1.6* 1.4* 1.9   GFR Estimated Creatinine Clearance: 98.7 mL/min (A) (by C-G formula based on SCr of 1.35 mg/dL (H)). Liver Function Tests: No results for input(s): AST, ALT, ALKPHOS, BILITOT, PROT, ALBUMIN  in the last 168 hours. No results for input(s): LIPASE, AMYLASE in the last 168 hours. No results for input(s): AMMONIA in the last 168 hours. Coagulation profile No results for input(s): INR, PROTIME in the last 168 hours.  CBC: Recent Labs  Lab 10/22/23 0946 10/23/23 0543 10/25/23 0917  WBC 6.6 7.3 7.8  NEUTROABS  --  4.6  --   HGB 8.8* 8.7* 8.3*  HCT 35.3*  34.7* 31.9*  MCV 77.4* 77.8* 76.1*  PLT 171 156 197   Cardiac Enzymes: No results for input(s): CKTOTAL, CKMB, CKMBINDEX, TROPONINI in the last 168 hours. BNP (last 3 results) No results for input(s): PROBNP in the last 8760 hours. CBG: Recent Labs  Lab 10/22/23 2326 10/23/23 0742 10/23/23 1144 10/25/23 0306 10/26/23 1110  GLUCAP 98 98 84 103* 122*   D-Dimer: No results for input(s): DDIMER in the last 72 hours. Hgb A1c: No results for input(s): HGBA1C in the last 72 hours. Lipid Profile: No results for input(s): CHOL, HDL, LDLCALC, TRIG, CHOLHDL, LDLDIRECT in the last 72 hours. Thyroid function studies: No results for input(s): TSH, T4TOTAL, T3FREE, THYROIDAB in the last 72 hours.  Invalid input(s): FREET3 Anemia work up: No results for input(s): VITAMINB12, FOLATE, FERRITIN, TIBC, IRON , RETICCTPCT in the last 72 hours. Sepsis Labs: Recent Labs  Lab 10/22/23 0946 10/23/23 0543 10/25/23 0917  WBC 6.6 7.3 7.8    Microbiology Recent Results (from the past 240 hours)  Culture, Respiratory w Gram Stain     Status: None   Collection Time: 10/17/23 12:15 PM   Specimen: Tracheal Aspirate; Respiratory  Result Value Ref Range Status   Specimen Description TRACHEAL ASPIRATE  Final   Special Requests NONE  Final   Gram Stain   Final    MODERATE WBC PRESENT, PREDOMINANTLY PMN FEW GRAM POSITIVE COCCI IN CLUSTERS RARE GRAM NEGATIVE RODS RARE GRAM POSITIVE RODS Performed at [facility], [address] [place], [place] [address]    Culture FEW [name]  Final   Report Status  10/20/2023 FINAL  Final   Organism ID, Bacteria [name]  Final      Susceptibility   [name] - MIC*    CEFTAZIDIME  4 SENSITIVE Sensitive     CIPROFLOXACIN  <=0.25 SENSITIVE Sensitive     GENTAMICIN 4 SENSITIVE Sensitive     IMIPENEM 1 SENSITIVE Sensitive     PIP/TAZO 8 SENSITIVE Sensitive ug/mL     CEFEPIME  2 SENSITIVE Sensitive     * FEW [name]  Culture, Respiratory w Gram Stain     Status: None   Collection Time: 10/17/23 12:15 PM   Specimen: Bronchoalveolar Lavage; Respiratory  Result Value Ref Range Status   Specimen Description BRONCHIAL ALVEOLAR LAVAGE  Final   Special Requests NONE  Final   Gram Stain   Final    ABUNDANT WBC PRESENT, PREDOMINANTLY PMN FEW GRAM POSITIVE COCCI IN CLUSTERS RARE GRAM POSITIVE RODS Performed at [facility], [address], [place], [place] [address]    Culture FEW [name]  Final   Report Status 10/20/2023 FINAL  Final   Organism ID, Bacteria [name]  Final      Susceptibility   [name] - MIC*    CEFTAZIDIME  2 SENSITIVE Sensitive     CIPROFLOXACIN  <=0.25 SENSITIVE Sensitive     GENTAMICIN <=1 SENSITIVE Sensitive     IMIPENEM 1 SENSITIVE Sensitive     PIP/TAZO 8 SENSITIVE Sensitive ug/mL    CEFEPIME  2 SENSITIVE Sensitive     * FEW [name]  Gastrointestinal Panel by PCR , Stool     Status: None   Collection Time: 10/23/23  8:20 AM   Specimen: Stool  Result Value Ref Range Status   Campylobacter species NOT DETECTED NOT DETECTED Final   Plesimonas shigelloides NOT DETECTED NOT DETECTED Final   Salmonella species NOT DETECTED NOT DETECTED Final   Yersinia enterocolitica NOT DETECTED NOT DETECTED Final   Vibrio species NOT DETECTED NOT DETECTED Final   Vibrio cholerae NOT DETECTED NOT DETECTED Final   Enteroaggregative E coli (EAEC) NOT DETECTED NOT DETECTED Final   Enteropathogenic E coli (EPEC) NOT DETECTED NOT DETECTED Final   Enterotoxigenic E coli (ETEC) NOT DETECTED NOT DETECTED Final   Shiga like toxin producing E coli (STEC) NOT DETECTED NOT DETECTED Final   Shigella/Enteroinvasive E coli (EIEC) NOT DETECTED NOT DETECTED Final   Cryptosporidium NOT DETECTED NOT DETECTED Final   Cyclospora cayetanensis NOT DETECTED NOT DETECTED Final   Entamoeba  histolytica NOT DETECTED NOT DETECTED Final   Giardia lamblia NOT DETECTED NOT DETECTED Final   Adenovirus F40/41 NOT DETECTED NOT DETECTED Final   Astrovirus NOT DETECTED NOT DETECTED Final   Norovirus GI/GII NOT DETECTED NOT DETECTED Final   Rotavirus A NOT DETECTED NOT DETECTED Final   Sapovirus (I, II, IV, and V) NOT DETECTED NOT DETECTED Final    Comment: Performed at [facility], [address] [place], [place] [address]    Procedures and diagnostic studies:  No results found.             LOS: 11 days   Amilio Zehnder  [facility] Chartered loss adjuster on [email or web address]. If 7PM-7AM, please contact night-coverage at [email or web address]     10/26/2023, 12:06 PM

## 2023-10-26 NOTE — Discharge Summary (Signed)
 Physician Discharge Summary   Patient: Gary [name] MRN: [id number] DOB: 1956/02/21  Admit date:     10/11/2023  Discharge date: 10/26/23  Discharge Physician: AIDA CHO   PCP: No primary care provider on file.   Recommendations at discharge:   Follow-up with physician at an LTAC facility within 24 hours of discharge  Discharge Diagnoses: Active Problems:   Acute on chronic respiratory failure with hypoxia and hypercapnia ([name])   (HFpEF) heart failure with preserved ejection fraction ([name])   Acute hypoxemic respiratory failure ([name])   Acute hypercapnic respiratory failure ([name])   Bacteremia due to Pseudomonas   Pseudomonas pneumonia ([name])  Resolved Problems:   Acute on chronic respiratory failure with hypoxia and hypercapnia [facility])  Hospital Course:  Gary [name] is a 68 y.o. male with past medical history significant for heart failure preserved ejection fraction, AAA, COPD, hypertension, anemia, chronic trach (Shiley XLT #7 ), who presented to the ED on 6/27 with abdominal pain and diarrhea.  He was diagnosed with diverticulitis and he apparently was discharged home but family refused to take him and said they could not care for him anymore.  So he was taken back to the ED and has been in the ED in observation since then.  He apparently is not on a vent and he was using trilogy NIV at night and passive +/- trach capped during the day.  He was not getting NIV at night, he developed shortness of breath.  He was found to be hypercarbic and placed on the vent and CCM was called for admission and management of vent.  In the ED his PCO2 was 119.  He had new infiltrate on chest x-ray.  He was admitted and treated for pneumonia.   7/1: admit to ICU 7/2 Weaning sedation, ABG correcting, plan to wean to trache collar  7/3 Increased agitation overnight, propofol  added back  7/4 remains on ventilator via tracheostomy with no acute issues overnight 7/5 no issues overnight, remains on  ventilator with decreased settings 7/6 placed back on vent overnight  7/7 PMV and PO intake  7/8 PMV this AM, tolerating PO intake. Vented overnight 7/9 PMV in AM, having breakfast. 7/10 awake and comfortable. NO complaints. Refused blood draws. 7/11 used BiPAP overnight (8/8) for 4 hours. Refused blood draws.  Assessment and Plan:   Acute on chronic hypoxic and hypercapnic respiratory failure, OHS, OSA, morbid obesity: Tracheostomy dependent.   He is not medically adherent with noninvasive ventilation through tracheostomy. He was on trach collar (50% FiO2 at 12 L) during the day and vented at night. Plan to switch back to noninvasive positive pressure ventilation nocturnally through the trach   Continue oxygen  therapy via tracheostomy and BiPAP at night.     Acute toxic metabolic encephalopathy in the setting of hypercapnia: Mental status has improved.     Sepsis secondary to Pseudomonas bacteremia, Pseudomonas pneumonia, Neisseria mucosa bacteremia: Sepsis physiology has resolved.  Continue IV cefepime  through 10/28/2023 to complete 14 days of treatment.     Chronic HFpEF: Compensated.  Continue torsemide  and spironolactone      AKI on CKD stage IIIa: Creatinine appears close to baseline.    Recent acute diverticulitis and diarrhea: Resolved    Comorbidities include anxiety, history of DVT (no longer on Eliquis ) history of GI bleed, chronic anemia   Case was discussed with DJ, who is the liaison for [facility] facility.  He has graciously accepted the patient in transfer.      Pain control -  [place]  Controlled Substance Reporting System database was reviewed. and patient was instructed, not to drive, operate heavy machinery, perform activities at heights, swimming or participation in water  activities or provide baby-sitting services while on Pain, Sleep and Anxiety Medications; until their outpatient Physician has advised to do so again. Also recommended to not to  take more than prescribed Pain, Sleep and Anxiety Medications.  Consultants: Intensivist Procedures performed: Bronchoscopy on 10/17/2023 Cortrak placement on 10/16/2023 Disposition: [facility] facility Diet recommendation:  Discharge Diet Orders (From admission, onward)     Start     Ordered   10/26/23 0000  Diet - low sodium heart healthy        10/26/23 1324           Cardiac diet DISCHARGE MEDICATION: Allergies as of 10/26/2023   No Known Allergies      Medication List     TAKE these medications    amphetamine -dextroamphetamine  30 MG tablet Commonly known as: ADDERALL Take 1 tablet by mouth daily with breakfast.   artificial tears ophthalmic solution Place 1 drop into both eyes as needed for dry eyes.   ceFEPIme  2 g in sodium chloride  0.9 % 100 mL Inject 2 g into the vein every 8 (eight) hours for 3 days.   Clotrimazole  Anti-Fungal 1 % cream Generic drug: clotrimazole  Apply topically 2 (two) times daily.   Dulera  100-5 MCG/ACT Aero Generic drug: mometasone -formoterol  Inhale 2 puffs into the lungs 2 (two) times daily.   escitalopram  10 MG tablet Commonly known as: LEXAPRO  Take 1 tablet (10 mg total) by mouth daily.   hydrOXYzine  25 MG tablet Commonly known as: ATARAX  Take 1 tablet (25 mg total) by mouth 3 (three) times daily as needed for anxiety.   ipratropium-albuterol  0.5-2.5 (3) MG/3ML Soln Commonly known as: DUONEB Take 3 mLs by nebulization every 6 (six) hours as needed.   mesalamine  1.2 g EC tablet Commonly known as: LIALDA  Take 1 tablet (1.2 g total) by mouth 2 (two) times daily.   oxyCODONE  5 MG immediate release tablet Commonly known as: Oxy IR/ROXICODONE  Take 1 tablet (5 mg total) by mouth 2 (two) times daily as needed for moderate pain (pain score 4-6) or severe pain (pain score 7-10).   pantoprazole  40 MG tablet Commonly known as: PROTONIX  Take 1 tablet (40 mg total) by mouth at bedtime.   senna-docusate 8.6-50 MG tablet Commonly  known as: Senokot-S Take 2 tablets by mouth at bedtime as needed for mild constipation. Home med.   spironolactone  25 MG tablet Commonly known as: ALDACTONE  Take 1 tablet (25 mg total) by mouth daily.   torsemide  20 MG tablet Commonly known as: DEMADEX  Take 2 tablets (40 mg total) by mouth 2 (two) times daily.   traZODone  50 MG tablet Commonly known as: DESYREL  Take 1 tablet (50 mg total) by mouth at bedtime as needed for sleep.   Zepbound 2.5 MG/0.5ML injection vial Generic drug: tirzepatide Inject 1 Dose into the skin once a week.        Contact information for follow-up providers     Go to  Delores Corean Pollen, MD.   Why: for reevaluation Contact information: [address] [place] [address] [phone]         [facility] Department at [facility].   Specialty: Emergency Medicine Why: As needed, If symptoms worsen Contact information: [address] [place]  [phone]             Contact  information for after-discharge care     Destination     [facility] .   Service: Skilled Nursing Contact information: [address] [place]  [address] [phone]                    Discharge Exam: Filed Weights   10/21/23 0500 10/23/23 0438 10/26/23 0502  Weight: (!) 193.8 kg (!) 194.8 kg (!) 194 kg   GEN: NAD SKIN: Warm and dry EYES: No pallor or icterus ENT: MMM, trach in place, he is able to speak with Passy-Muir valve CV: RRR PULM: CTA B ABD: soft, obese, NT, +BS CNS: AAO x 3, non focal EXT: No edema or tenderness  Condition at discharge: stable  The results of significant diagnostics from this hospitalization (including imaging, microbiology, ancillary and laboratory) are listed below for reference.   Imaging Studies: VAS US  LOWER EXTREMITY VENOUS (DVT) Result Date: 10/18/2023  Lower Venous DVT Study Patient Name:  AADAN CHENIER  Date of Exam:    10/18/2023 Medical Rec #: [id number]       Accession #:    [id number] Date of Birth: 1955/08/25       Patient Gender: M Patient Age:   32 years Exam Location:  [facility] Procedure:      VAS US  LOWER EXTREMITY VENOUS (DVT) Referring Phys: TORIBIO SHARPS --------------------------------------------------------------------------------  Indications: Swelling, and Pain.  Limitations: Body habitus and patient moving legs when scanning calves bilaterally. Patient also had restricted mobility due to ventilator. Comparison Study: Prior 07/10/2022 bilateral negative for DVT. Performing Technologist: Orvil Holmes RDCS  Examination Guidelines: A complete evaluation includes B-mode imaging, spectral Doppler, color Doppler, and power Doppler as needed of all accessible portions of each vessel. Bilateral testing is considered an integral part of a complete examination. Limited examinations for reoccurring indications may be performed as noted. The reflux portion of the exam is performed with the patient in reverse Trendelenburg.  +---------+---------------+---------+-----------+----------+--------------+ RIGHT    CompressibilityPhasicitySpontaneityPropertiesThrombus Aging +---------+---------------+---------+-----------+----------+--------------+ CFV      Full           Yes      Yes                                 +---------+---------------+---------+-----------+----------+--------------+ SFJ      Full           Yes      Yes                                 +---------+---------------+---------+-----------+----------+--------------+ FV Prox  Full           Yes      Yes                                 +---------+---------------+---------+-----------+----------+--------------+ FV Mid   Full           Yes      Yes                                 +---------+---------------+---------+-----------+----------+--------------+ FV DistalFull           Yes      Yes                                  +---------+---------------+---------+-----------+----------+--------------+  PFV      Full                                                        +---------+---------------+---------+-----------+----------+--------------+ POP      Full           Yes      Yes                                 +---------+---------------+---------+-----------+----------+--------------+ PTV      Full           Yes      Yes                                 +---------+---------------+---------+-----------+----------+--------------+ PERO     Full           Yes      Yes                                 +---------+---------------+---------+-----------+----------+--------------+ Gastroc  Full                                                        +---------+---------------+---------+-----------+----------+--------------+ GSV      Full           Yes      Yes                                 +---------+---------------+---------+-----------+----------+--------------+   +---------+---------------+---------+-----------+----------+--------------+ LEFT     CompressibilityPhasicitySpontaneityPropertiesThrombus Aging +---------+---------------+---------+-----------+----------+--------------+ CFV      Full           Yes      Yes                                 +---------+---------------+---------+-----------+----------+--------------+ SFJ      Full           Yes      Yes                                 +---------+---------------+---------+-----------+----------+--------------+ FV Prox  Full           Yes      Yes                                 +---------+---------------+---------+-----------+----------+--------------+ FV Mid   Full           Yes      Yes                                 +---------+---------------+---------+-----------+----------+--------------+ FV DistalFull           Yes  Yes                                  +---------+---------------+---------+-----------+----------+--------------+ PFV      Full                                                        +---------+---------------+---------+-----------+----------+--------------+ POP      Full           Yes      Yes                                 +---------+---------------+---------+-----------+----------+--------------+ PTV      Full           Yes      Yes                                 +---------+---------------+---------+-----------+----------+--------------+ PERO     Full           Yes      Yes                                 +---------+---------------+---------+-----------+----------+--------------+ Gastroc  Full                                                        +---------+---------------+---------+-----------+----------+--------------+ GSV      Full           Yes      Yes                                 +---------+---------------+---------+-----------+----------+--------------+   Left Technical Findings: Not visualized segments include medial and proximal pero and ptv not visualed due to patient moving legs.   Summary: BILATERAL: - No evidence of deep vein thrombosis seen in the lower extremities, bilaterally. -No evidence of popliteal cyst, bilaterally.   *See table(s) above for measurements and observations. Electronically signed by Lonni Gaskins MD on 10/18/2023 at 10:22:23 AM.    Final    ECHOCARDIOGRAM COMPLETE Result Date: 10/17/2023    ECHOCARDIOGRAM REPORT   Patient Name:   Gary [name] Date of Exam: 10/17/2023 Medical Rec #:  [id number]      Height:       76.0 in Accession #:    [id number]     Weight:       427.7 lb Date of Birth:  03-14-1956      BSA:          3.060 m Patient Age:    68 years       BP:           93/64 mmHg Patient Gender: M              HR:           66 bpm. Exam Location:  Inpatient Procedure: 2D  Echo, Cardiac Doppler, Color Doppler and Intracardiac            Opacification Agent (Both  Spectral and Color Flow Doppler were            utilized during procedure). Indications:    CHF  History:        Patient has prior history of Echocardiogram examinations, most                 recent 11/08/2022. COPD, Signs/Symptoms:Shortness of Breath; Risk                 Factors:Dyslipidemia.  Sonographer:    Therisa Crouch Referring Phys: [id number] JOSHUA C SMITH IMPRESSIONS  1. Left ventricular ejection fraction, by estimation, is 40 to 45%. The left ventricle has mildly decreased function. The left ventricle demonstrates global hypokinesis. Left ventricular diastolic parameters are indeterminate.  2. Right ventricular systolic function is normal. The right ventricular size is moderately enlarged. Tricuspid regurgitation signal is inadequate for assessing PA pressure.  3. The mitral valve is grossly normal. No evidence of mitral valve regurgitation. No evidence of mitral stenosis.  4. The aortic valve was not well visualized. Aortic valve regurgitation is not visualized. No aortic stenosis is present.  5. Aortic dilatation noted. There is mild dilatation of the ascending aorta, measuring 41 mm.  6. The inferior vena cava is dilated in size with <50% respiratory variability, suggesting right atrial pressure of 15 mmHg. Comparison(s): Prior images reviewed side by side. Changes from prior study are noted. EF now 40-45%. FINDINGS  Left Ventricle: Left ventricular ejection fraction, by estimation, is 40 to 45%. The left ventricle has mildly decreased function. The left ventricle demonstrates global hypokinesis. Definity  contrast agent was given IV to delineate the left ventricular  endocardial borders. The left ventricular internal cavity size was normal in size. There is no left ventricular hypertrophy. Left ventricular diastolic parameters are indeterminate. Right Ventricle: The right ventricular size is moderately enlarged. Right vetricular wall thickness was not well visualized. Right ventricular systolic function is  normal. Tricuspid regurgitation signal is inadequate for assessing PA pressure. Left Atrium: Left atrial size was normal in size. Right Atrium: Right atrial size was normal in size. Pericardium: There is no evidence of pericardial effusion. Mitral Valve: The mitral valve is grossly normal. No evidence of mitral valve regurgitation. No evidence of mitral valve stenosis. Tricuspid Valve: The tricuspid valve is not well visualized. Tricuspid valve regurgitation is not demonstrated. No evidence of tricuspid stenosis. Aortic Valve: The aortic valve was not well visualized. Aortic valve regurgitation is not visualized. No aortic stenosis is present. Pulmonic Valve: The pulmonic valve was not well visualized. Pulmonic valve regurgitation is not visualized. No evidence of pulmonic stenosis. Aorta: Aortic dilatation noted. There is mild dilatation of the ascending aorta, measuring 41 mm. Venous: The inferior vena cava is dilated in size with less than 50% respiratory variability, suggesting right atrial pressure of 15 mmHg. IAS/Shunts: The interatrial septum was not well visualized.  LEFT VENTRICLE PLAX 2D LVIDd:         6.00 cm      Diastology LVIDs:         5.20 cm      LV e' medial:    6.09 cm/s LV PW:         1.20 cm      LV E/e' medial:  6.6 LV IVS:        1.20 cm      LV e' lateral:  6.31 cm/s LVOT diam:     2.60 cm      LV E/e' lateral: 6.4 LVOT Area:     5.31 cm  LV Volumes (MOD) LV vol d, MOD A2C: 206.0 ml LV vol d, MOD A4C: 205.0 ml LV vol s, MOD A2C: 103.0 ml LV vol s, MOD A4C: 121.0 ml LV SV MOD A2C:     103.0 ml LV SV MOD A4C:     205.0 ml LV SV MOD BP:      99.0 ml RIGHT VENTRICLE             IVC RV Basal diam:  4.50 cm     IVC diam: 2.90 cm RV Mid diam:    3.90 cm RV S prime:     12.00 cm/s TAPSE (M-mode): 2.6 cm LEFT ATRIUM             Index LA diam:        3.40 cm 1.11 cm/m LA Vol (A2C):   66.1 ml 21.60 ml/m LA Vol (A4C):   83.6 ml 27.32 ml/m LA Biplane Vol: 79.0 ml 25.82 ml/m   AORTA Ao Root diam:  3.10 cm Ao Asc diam:  4.10 cm MITRAL VALVE MV Area (PHT): 3.70 cm    SHUNTS MV Decel Time: 205 msec    Systemic Diam: 2.60 cm MV E velocity: 40.30 cm/s MV A velocity: 68.10 cm/s MV E/A ratio:  0.59 Shelda Bruckner MD Electronically signed by Shelda Bruckner MD Signature Date/Time: 10/17/2023/5:14:19 PM    Final    DG Chest Port 1 View Result Date: 10/17/2023 CLINICAL DATA:  Acute respiratory failure. EXAM: PORTABLE CHEST 1 VIEW COMPARISON:  10/15/2023 FINDINGS: Tracheostomy tube again noted. Feeding tube be comes obscured over the lower mediastinum due to technique. Low lung volumes with vascular congestion and diffuse interstitial opacity suggesting edema. Bibasilar collapse/consolidation. Telemetry leads overlie the chest. IMPRESSION: 1. Low lung volumes with vascular congestion and diffuse interstitial opacity suggesting edema. 2. Bibasilar collapse/consolidation. Electronically Signed   By: Camellia Candle M.D.   On: 10/17/2023 08:53   CT Head Wo Contrast Result Date: 10/16/2023 CLINICAL DATA:  Headache, increasing frequency or severity. EXAM: CT HEAD WITHOUT CONTRAST TECHNIQUE: Contiguous axial images were obtained from the base of the skull through the vertex without intravenous contrast. RADIATION DOSE REDUCTION: This exam was performed according to the departmental dose-optimization program which includes automated exposure control, adjustment of the mA and/or kV according to patient size and/or use of iterative reconstruction technique. COMPARISON:  CT head 11/28/2022. FINDINGS: Brain: No evidence of acute infarction, hemorrhage, hydrocephalus, extra-axial collection or mass lesion/mass effect. Patchy white matter hypodensities are nonspecific but compatible with chronic microvascular ischemic disease that appears similar to prior. Vascular: No hyperdense vessel identified. Calcific atherosclerosis. Skull: No acute fracture. Sinuses/Orbits: Clear sinuses.  No acute orbital findings. Other: No  mastoid effusions. IMPRESSION: No evidence of acute intracranial abnormality. Electronically Signed   By: Gilmore GORMAN Molt M.D.   On: 10/16/2023 00:45   DG Chest Portable 1 View Result Date: 10/15/2023 CLINICAL DATA:  Shortness of breath. EXAM: PORTABLE CHEST 1 VIEW COMPARISON:  10/11/2023 FINDINGS: Tracheostomy tube tip at the thoracic inlet. Cardiomegaly is stable. No acute airspace disease. Stable bilateral lung scarring. No pulmonary edema. No pneumothorax or pleural effusion. IMPRESSION: 1. No acute findings. 2. Stable cardiomegaly. 3. Stable bilateral lung scarring. Electronically Signed   By: Andrea Gasman M.D.   On: 10/15/2023 20:34   CT ABDOMEN PELVIS W CONTRAST Result Date: 10/11/2023  CLINICAL DATA:  Acute abdominal pain and diarrhea for 1 week. EXAM: CT ABDOMEN AND PELVIS WITH CONTRAST TECHNIQUE: Multidetector CT imaging of the abdomen and pelvis was performed using the standard protocol following bolus administration of intravenous contrast. RADIATION DOSE REDUCTION: This exam was performed according to the departmental dose-optimization program which includes automated exposure control, adjustment of the mA and/or kV according to patient size and/or use of iterative reconstruction technique. CONTRAST:  OMNIPAQUE  IOHEXOL  350 MG/ML SOLN COMPARISON:  09/07/2022 FINDINGS: Lower Chest: Bibasilar scarring versus atelectasis. Multiple old bilateral rib fracture deformities. Hepatobiliary: No suspicious hepatic masses identified. Probable tiny sub-cm cyst seen in liver dome. Tiny calcified gallstones noted. No signs of cholecystitis or biliary ductal dilatation. Pancreas:  No mass or inflammatory changes. Spleen: Within normal limits in size and appearance. Adrenals/Urinary Tract: Stable 2 cm right adrenal mass, consistent with benign adenoma. Small benign-appearing left renal cyst again noted. (No followup imaging is recommended). No suspicious renal masses identified. No evidence of ureteral  calculi or hydronephrosis. Unremarkable unopacified urinary bladder. Stomach/Bowel: Mild diverticulitis is again seen in the proximal sigmoid colon, but appears decreased since previous study. No other inflammatory process or abnormal fluid collections identified. Previous right colectomy. No masses identified. Vascular/Lymphatic: No pathologically enlarged lymph nodes. No acute vascular findings. Reproductive:  No mass or other significant abnormality. Other: Small paraumbilical hernia again seen, which contains only fat. Musculoskeletal: No suspicious bone lesions identified. Bilateral hip osteoarthritis and lower lumbar spine degenerative changes again noted. IMPRESSION: Mild uncomplicated sigmoid diverticulitis. No No evidence of perforation or abscess. Cholelithiasis. No radiographic evidence of cholecystitis. Stable small fat-containing paraumbilical hernia. Electronically Signed   By: Norleen DELENA Kil M.D.   On: 10/11/2023 17:22   DG Chest Portable 1 View Result Date: 10/11/2023 CLINICAL DATA:  Cough and diarrhea for 1 week. EXAM: PORTABLE CHEST 1 VIEW COMPARISON:  06/10/2023 FINDINGS: Tracheostomy tube remains in place. Low lung volumes noted. Mild-to-moderate cardiomegaly is again seen. Mild parenchymal scarring is again seen in both mid lungs. No evidence of pulmonary consolidation, pneumothorax, or pleural effusion. IMPRESSION: Stable cardiomegaly and mild bilateral pulmonary scarring. No acute findings. Electronically Signed   By: Norleen DELENA Kil M.D.   On: 10/11/2023 13:59    Microbiology: Results for orders placed or performed during the hospital encounter of 10/11/23  C Difficile Quick Screen w PCR reflex     Status: None   Collection Time: 10/11/23  1:04 PM   Specimen: STOOL  Result Value Ref Range Status   C Diff antigen NEGATIVE NEGATIVE Final   C Diff toxin NEGATIVE NEGATIVE Final   C Diff interpretation No C. difficile detected.  Final    Comment: Performed at [facility],  [address], [place], [place] [address]  Resp panel by RT-PCR (RSV, Flu A&B, Covid) Urine, Clean Catch     Status: None   Collection Time: 10/11/23  1:31 PM   Specimen: Urine, Clean Catch; Nasal Swab  Result Value Ref Range Status   SARS Coronavirus 2 by RT PCR NEGATIVE NEGATIVE Final   Influenza A by PCR NEGATIVE NEGATIVE Final   Influenza B by PCR NEGATIVE NEGATIVE Final    Comment: (NOTE) The Xpert Xpress SARS-CoV-2/FLU/RSV plus assay is intended as an aid in the diagnosis of influenza from Nasopharyngeal swab specimens and should not be used as a sole basis for treatment. Nasal washings and aspirates are unacceptable for Xpert Xpress SARS-CoV-2/FLU/RSV testing.  Fact Sheet for Patients: BloggerCourse.com  Fact Sheet for Healthcare Providers:  SeriousBroker.it  This test is not yet approved or cleared by the [place]  FDA and has been authorized for detection and/or diagnosis of SARS-CoV-2 by FDA under an Emergency Use Authorization (EUA). This EUA will remain in effect (meaning this test can be used) for the duration of the COVID-19 declaration under Section 564(b)(1) of the Act, 21 U.S.C. section 360bbb-3(b)(1), unless the authorization is terminated or revoked.     Resp Syncytial Virus by PCR NEGATIVE NEGATIVE Final    Comment: (NOTE) Fact Sheet for Patients: BloggerCourse.com  Fact Sheet for Healthcare Providers: SeriousBroker.it  This test is not yet approved or cleared by the [place]  FDA and has been authorized for detection and/or diagnosis of SARS-CoV-2 by FDA under an Emergency Use Authorization (EUA). This EUA will remain in effect (meaning this test can be used) for the duration of the COVID-19 declaration under Section 564(b)(1) of the Act, 21 U.S.C. section 360bbb-3(b)(1), unless the authorization is terminated or revoked.  Performed at [facility], [address], [place], [place] [address]   Blood culture (routine x 2)     Status: Abnormal   Collection Time: 10/15/23  8:05 PM   Specimen: BLOOD RIGHT HAND  Result Value Ref Range Status   Specimen Description BLOOD RIGHT HAND  Final   Special Requests   Final    BOTTLES DRAWN AEROBIC AND ANAEROBIC Blood Culture adequate volume   Culture  Setup Time   Final    GRAM NEGATIVE RODS IN BOTH AEROBIC AND ANAEROBIC BOTTLES CRITICAL VALUE NOTED.  VALUE IS CONSISTENT WITH PREVIOUSLY REPORTED AND CALLED VALUE.    Culture (A)  Final    [name] SUSCEPTIBILITIES PERFORMED ON PREVIOUS CULTURE WITHIN THE LAST 5 DAYS. Performed at [facility], [address], [place], [place] [address]    Report Status 10/18/2023 FINAL  Final  Blood culture (routine x 2)     Status: Abnormal   Collection Time: 10/15/23  8:50 PM   Specimen: BLOOD LEFT HAND  Result Value Ref Range Status   Specimen Description BLOOD LEFT HAND  Final   Special Requests   Final    BOTTLES DRAWN AEROBIC AND ANAEROBIC Blood Culture results may not be optimal due to an inadequate volume of blood received in culture bottles   Culture  Setup Time   Final    GRAM NEGATIVE RODS IN BOTH AEROBIC AND ANAEROBIC BOTTLES CRITICAL RESULT CALLED TO, READ BACK BY AND VERIFIED WITH: PHARMD NATE GOAD ON 10/16/23 @ 1920 BY DRT    Culture (A)  Final    [name] NEISSERIA MUCOSA BETA LACTAMASE NEGATIVE Performed at [facility], [address] [place], [place] [address]    Report Status 10/21/2023 FINAL  Final   Organism ID, Bacteria [name]  Final      Susceptibility   [name] - MIC*    CEFTAZIDIME  2 SENSITIVE Sensitive     CIPROFLOXACIN  <=0.25 SENSITIVE Sensitive     GENTAMICIN 2 SENSITIVE Sensitive     IMIPENEM 1 SENSITIVE Sensitive     PIP/TAZO 8 SENSITIVE Sensitive ug/mL    CEFEPIME  2 SENSITIVE Sensitive     * [name]  Blood Culture ID  Panel (Reflexed)     Status: Abnormal   Collection Time: 10/15/23  8:50 PM  Result Value Ref Range Status   Enterococcus faecalis NOT DETECTED NOT DETECTED Final   Enterococcus Faecium NOT DETECTED NOT DETECTED Final   Listeria monocytogenes NOT DETECTED NOT  DETECTED Final   Staphylococcus species NOT DETECTED NOT DETECTED Final   Staphylococcus aureus (BCID) NOT DETECTED NOT DETECTED Final   Staphylococcus epidermidis NOT DETECTED NOT DETECTED Final   Staphylococcus lugdunensis NOT DETECTED NOT DETECTED Final   Streptococcus species NOT DETECTED NOT DETECTED Final   Streptococcus agalactiae NOT DETECTED NOT DETECTED Final   Streptococcus pneumoniae NOT DETECTED NOT DETECTED Final   Streptococcus pyogenes NOT DETECTED NOT DETECTED Final   A.calcoaceticus-baumannii NOT DETECTED NOT DETECTED Final   Bacteroides fragilis NOT DETECTED NOT DETECTED Final   Enterobacterales NOT DETECTED NOT DETECTED Final   Enterobacter cloacae complex NOT DETECTED NOT DETECTED Final   Escherichia coli NOT DETECTED NOT DETECTED Final   Klebsiella aerogenes NOT DETECTED NOT DETECTED Final   Klebsiella oxytoca NOT DETECTED NOT DETECTED Final   Klebsiella pneumoniae NOT DETECTED NOT DETECTED Final   Proteus species NOT DETECTED NOT DETECTED Final   Salmonella species NOT DETECTED NOT DETECTED Final   Serratia marcescens NOT DETECTED NOT DETECTED Final   Haemophilus influenzae NOT DETECTED NOT DETECTED Final   Neisseria meningitidis NOT DETECTED NOT DETECTED Final   [name] DETECTED (A) NOT DETECTED Final    Comment: CRITICAL RESULT CALLED TO, READ BACK BY AND VERIFIED WITH: PHARMD NATE GOAD ON 10/16/23 @ 1920 BY DRT    Stenotrophomonas maltophilia NOT DETECTED NOT DETECTED Final   Candida albicans NOT DETECTED NOT DETECTED Final   Candida auris NOT DETECTED NOT DETECTED Final   Candida glabrata NOT DETECTED NOT DETECTED Final   Candida krusei NOT DETECTED NOT DETECTED Final   Candida  parapsilosis NOT DETECTED NOT DETECTED Final   Candida tropicalis NOT DETECTED NOT DETECTED Final   Cryptococcus neoformans/gattii NOT DETECTED NOT DETECTED Final   CTX-M ESBL NOT DETECTED NOT DETECTED Final   Carbapenem resistance IMP NOT DETECTED NOT DETECTED Final   Carbapenem resistance KPC NOT DETECTED NOT DETECTED Final   Carbapenem resistance NDM NOT DETECTED NOT DETECTED Final   Carbapenem resistance VIM NOT DETECTED NOT DETECTED Final    Comment: Performed at [facility], [address] [place], [place] [address]  MRSA Next Gen by PCR, Nasal     Status: None   Collection Time: 10/15/23 10:54 PM   Specimen: Nasal Mucosa; Nasal Swab  Result Value Ref Range Status   MRSA by PCR Next Gen NOT DETECTED NOT DETECTED Final    Comment: (NOTE) The GeneXpert MRSA Assay (FDA approved for NASAL specimens only), is one component of a comprehensive MRSA colonization surveillance program. It is not intended to diagnose MRSA infection nor to guide or monitor treatment for MRSA infections. Test performance is not FDA approved in patients less than 74 years old. Performed at [facility], [address] [place], [place] [address]   Culture, Respiratory w Gram Stain     Status: None   Collection Time: 10/17/23 12:15 PM   Specimen: Tracheal Aspirate; Respiratory  Result Value Ref Range Status   Specimen Description TRACHEAL ASPIRATE  Final   Special Requests NONE  Final   Gram Stain   Final    MODERATE WBC PRESENT, PREDOMINANTLY PMN FEW GRAM POSITIVE COCCI IN CLUSTERS RARE GRAM NEGATIVE RODS RARE GRAM POSITIVE RODS Performed at [facility], [address], [place], [place] [address]    Culture FEW [name]  Final   Report Status 10/20/2023 FINAL  Final   Organism ID, Bacteria [name]  Final      Susceptibility   Pseudomonas  aeruginosa - MIC*    CEFTAZIDIME  4 SENSITIVE Sensitive     CIPROFLOXACIN  <=0.25 SENSITIVE Sensitive      GENTAMICIN 4 SENSITIVE Sensitive     IMIPENEM 1 SENSITIVE Sensitive     PIP/TAZO 8 SENSITIVE Sensitive ug/mL    CEFEPIME  2 SENSITIVE Sensitive     * FEW [name]  Culture, Respiratory w Gram Stain     Status: None   Collection Time: 10/17/23 12:15 PM   Specimen: Bronchoalveolar Lavage; Respiratory  Result Value Ref Range Status   Specimen Description BRONCHIAL ALVEOLAR LAVAGE  Final   Special Requests NONE  Final   Gram Stain   Final    ABUNDANT WBC PRESENT, PREDOMINANTLY PMN FEW GRAM POSITIVE COCCI IN CLUSTERS RARE GRAM POSITIVE RODS Performed at [facility], [address], [place], [place] [address]    Culture FEW [name]  Final   Report Status 10/20/2023 FINAL  Final   Organism ID, Bacteria [name]  Final      Susceptibility   [name] - MIC*    CEFTAZIDIME  2 SENSITIVE Sensitive     CIPROFLOXACIN  <=0.25 SENSITIVE Sensitive     GENTAMICIN <=1 SENSITIVE Sensitive     IMIPENEM 1 SENSITIVE Sensitive     PIP/TAZO 8 SENSITIVE Sensitive ug/mL    CEFEPIME  2 SENSITIVE Sensitive     * FEW [name]  Gastrointestinal Panel by PCR , Stool     Status: None   Collection Time: 10/23/23  8:20 AM   Specimen: Stool  Result Value Ref Range Status   Campylobacter species NOT DETECTED NOT DETECTED Final   Plesimonas shigelloides NOT DETECTED NOT DETECTED Final   Salmonella species NOT DETECTED NOT DETECTED Final   Yersinia enterocolitica NOT DETECTED NOT DETECTED Final   Vibrio species NOT DETECTED NOT DETECTED Final   Vibrio cholerae NOT DETECTED NOT DETECTED Final   Enteroaggregative E coli (EAEC) NOT DETECTED NOT DETECTED Final   Enteropathogenic E coli (EPEC) NOT DETECTED NOT DETECTED Final   Enterotoxigenic E coli (ETEC) NOT DETECTED NOT DETECTED Final   Shiga like toxin producing E coli (STEC) NOT DETECTED NOT DETECTED Final   Shigella/Enteroinvasive E coli (EIEC) NOT DETECTED NOT DETECTED Final    Cryptosporidium NOT DETECTED NOT DETECTED Final   Cyclospora cayetanensis NOT DETECTED NOT DETECTED Final   Entamoeba histolytica NOT DETECTED NOT DETECTED Final   Giardia lamblia NOT DETECTED NOT DETECTED Final   Adenovirus F40/41 NOT DETECTED NOT DETECTED Final   Astrovirus NOT DETECTED NOT DETECTED Final   Norovirus GI/GII NOT DETECTED NOT DETECTED Final   Rotavirus A NOT DETECTED NOT DETECTED Final   Sapovirus (I, II, IV, and V) NOT DETECTED NOT DETECTED Final    Comment: Performed at [facility], [address] [place], [place] [address]    Labs: CBC: Recent Labs  Lab 10/22/23 0946 10/23/23 0543 10/25/23 0917  WBC 6.6 7.3 7.8  NEUTROABS  --  4.6  --   HGB 8.8* 8.7* 8.3*  HCT 35.3* 34.7* 31.9*  MCV 77.4* 77.8* 76.1*  PLT 171 156 197   Basic Metabolic Panel: Recent Labs  Lab 10/22/23 0946 10/22/23 0947 10/23/23 0543 10/25/23 0917 10/26/23 0858  NA 141 140 141 140 140  K 4.0 4.1 3.8 3.1* 3.9  CL 106 104 102 100 103  CO2 28 27 30 30 27   GLUCOSE 111* 112* 94 145* 140*  BUN 20 20 23 19 22   CREATININE 1.17 1.15 1.35* 1.29* 1.35*  CALCIUM  8.5*  8.4* 8.3* 8.1* 8.2*  MG  --   --  1.6* 1.4* 1.9   Liver Function Tests: No results for input(s): AST, ALT, ALKPHOS, BILITOT, PROT, ALBUMIN  in the last 168 hours. CBG: Recent Labs  Lab 10/22/23 2326 10/23/23 0742 10/23/23 1144 10/25/23 0306 10/26/23 1110  GLUCAP 98 98 84 103* 122*    Discharge time spent: greater than 30 minutes.  Signed: AIDA CHO, MD [facility] 10/26/2023

## 2023-10-27 DIAGNOSIS — J9621 Acute and chronic respiratory failure with hypoxia: Secondary | ICD-10-CM

## 2023-10-27 DIAGNOSIS — J151 Pneumonia due to Pseudomonas: Secondary | ICD-10-CM

## 2023-10-27 DIAGNOSIS — Z930 Tracheostomy status: Secondary | ICD-10-CM

## 2023-10-27 DIAGNOSIS — I5022 Chronic systolic (congestive) heart failure: Secondary | ICD-10-CM

## 2023-10-27 DIAGNOSIS — I5032 Chronic diastolic (congestive) heart failure: Secondary | ICD-10-CM

## 2023-10-27 DIAGNOSIS — N1831 Chronic kidney disease, stage 3a: Secondary | ICD-10-CM

## 2023-10-27 DIAGNOSIS — J189 Pneumonia, unspecified organism: Secondary | ICD-10-CM

## 2023-10-28 DIAGNOSIS — I5032 Chronic diastolic (congestive) heart failure: Secondary | ICD-10-CM

## 2023-10-28 DIAGNOSIS — J9621 Acute and chronic respiratory failure with hypoxia: Secondary | ICD-10-CM

## 2023-10-28 DIAGNOSIS — J151 Pneumonia due to Pseudomonas: Secondary | ICD-10-CM

## 2023-10-28 DIAGNOSIS — N1831 Chronic kidney disease, stage 3a: Secondary | ICD-10-CM

## 2023-11-02 DIAGNOSIS — J151 Pneumonia due to Pseudomonas: Secondary | ICD-10-CM

## 2023-11-02 DIAGNOSIS — J9621 Acute and chronic respiratory failure with hypoxia: Secondary | ICD-10-CM

## 2023-11-02 DIAGNOSIS — N1831 Chronic kidney disease, stage 3a: Secondary | ICD-10-CM

## 2023-11-02 DIAGNOSIS — I5032 Chronic diastolic (congestive) heart failure: Secondary | ICD-10-CM

## 2023-11-04 DIAGNOSIS — I5032 Chronic diastolic (congestive) heart failure: Secondary | ICD-10-CM

## 2023-11-04 DIAGNOSIS — N1831 Chronic kidney disease, stage 3a: Secondary | ICD-10-CM

## 2023-11-04 DIAGNOSIS — J151 Pneumonia due to Pseudomonas: Secondary | ICD-10-CM

## 2023-11-04 DIAGNOSIS — Z930 Tracheostomy status: Secondary | ICD-10-CM

## 2023-11-04 DIAGNOSIS — J189 Pneumonia, unspecified organism: Secondary | ICD-10-CM

## 2023-11-04 DIAGNOSIS — J9621 Acute and chronic respiratory failure with hypoxia: Secondary | ICD-10-CM

## 2023-11-04 DIAGNOSIS — I5022 Chronic systolic (congestive) heart failure: Secondary | ICD-10-CM

## 2023-11-05 DIAGNOSIS — J189 Pneumonia, unspecified organism: Secondary | ICD-10-CM

## 2023-11-05 DIAGNOSIS — J9621 Acute and chronic respiratory failure with hypoxia: Secondary | ICD-10-CM

## 2023-11-05 DIAGNOSIS — I5022 Chronic systolic (congestive) heart failure: Secondary | ICD-10-CM

## 2023-11-05 DIAGNOSIS — Z930 Tracheostomy status: Secondary | ICD-10-CM

## 2023-11-06 DIAGNOSIS — J9621 Acute and chronic respiratory failure with hypoxia: Secondary | ICD-10-CM

## 2023-11-06 DIAGNOSIS — Z930 Tracheostomy status: Secondary | ICD-10-CM

## 2023-11-06 DIAGNOSIS — J189 Pneumonia, unspecified organism: Secondary | ICD-10-CM

## 2023-11-06 DIAGNOSIS — I5022 Chronic systolic (congestive) heart failure: Secondary | ICD-10-CM

## 2023-11-07 DIAGNOSIS — Z930 Tracheostomy status: Secondary | ICD-10-CM

## 2023-11-07 DIAGNOSIS — I5022 Chronic systolic (congestive) heart failure: Secondary | ICD-10-CM

## 2023-11-07 DIAGNOSIS — J189 Pneumonia, unspecified organism: Secondary | ICD-10-CM

## 2023-11-07 DIAGNOSIS — J9621 Acute and chronic respiratory failure with hypoxia: Secondary | ICD-10-CM

## 2023-11-08 DIAGNOSIS — J9621 Acute and chronic respiratory failure with hypoxia: Secondary | ICD-10-CM

## 2023-11-08 DIAGNOSIS — J189 Pneumonia, unspecified organism: Secondary | ICD-10-CM

## 2023-11-08 DIAGNOSIS — Z930 Tracheostomy status: Secondary | ICD-10-CM

## 2023-11-08 DIAGNOSIS — I5022 Chronic systolic (congestive) heart failure: Secondary | ICD-10-CM

## 2023-11-09 DIAGNOSIS — Z930 Tracheostomy status: Secondary | ICD-10-CM

## 2023-11-09 DIAGNOSIS — I5022 Chronic systolic (congestive) heart failure: Secondary | ICD-10-CM

## 2023-11-09 DIAGNOSIS — J9621 Acute and chronic respiratory failure with hypoxia: Secondary | ICD-10-CM

## 2023-11-09 DIAGNOSIS — J189 Pneumonia, unspecified organism: Secondary | ICD-10-CM

## 2023-11-10 DIAGNOSIS — I5022 Chronic systolic (congestive) heart failure: Secondary | ICD-10-CM

## 2023-11-10 DIAGNOSIS — Z930 Tracheostomy status: Secondary | ICD-10-CM

## 2023-11-10 DIAGNOSIS — J189 Pneumonia, unspecified organism: Secondary | ICD-10-CM

## 2023-11-10 DIAGNOSIS — J9621 Acute and chronic respiratory failure with hypoxia: Secondary | ICD-10-CM

## 2023-11-11 DIAGNOSIS — I5032 Chronic diastolic (congestive) heart failure: Secondary | ICD-10-CM

## 2023-11-11 DIAGNOSIS — J9621 Acute and chronic respiratory failure with hypoxia: Secondary | ICD-10-CM

## 2023-11-11 DIAGNOSIS — J151 Pneumonia due to Pseudomonas: Secondary | ICD-10-CM

## 2023-11-11 DIAGNOSIS — N1831 Chronic kidney disease, stage 3a: Secondary | ICD-10-CM

## 2023-11-28 ENCOUNTER — Other Ambulatory Visit: Payer: Self-pay

## 2023-11-28 ENCOUNTER — Emergency Department

## 2023-11-28 ENCOUNTER — Inpatient Hospital Stay
Admission: EM | Admit: 2023-11-28 | Discharge: 2023-12-25 | DRG: 291 | Disposition: A | Discharge status: Skilled Nursing Facility | Source: Skilled Nursing Facility | Attending: Pulmonary Disease | Admitting: Pulmonary Disease

## 2023-11-28 DIAGNOSIS — Z833 Family history of diabetes mellitus: Secondary | ICD-10-CM | POA: Diagnosis not present

## 2023-11-28 DIAGNOSIS — J449 Chronic obstructive pulmonary disease, unspecified: Secondary | ICD-10-CM | POA: Diagnosis not present

## 2023-11-28 DIAGNOSIS — Z7401 Bed confinement status: Secondary | ICD-10-CM

## 2023-11-28 DIAGNOSIS — I714 Abdominal aortic aneurysm, without rupture, unspecified: Secondary | ICD-10-CM | POA: Diagnosis present

## 2023-11-28 DIAGNOSIS — K5791 Diverticulosis of intestine, part unspecified, without perforation or abscess with bleeding: Secondary | ICD-10-CM | POA: Diagnosis present

## 2023-11-28 DIAGNOSIS — I10 Essential (primary) hypertension: Secondary | ICD-10-CM | POA: Diagnosis not present

## 2023-11-28 DIAGNOSIS — E66813 Obesity, class 3: Secondary | ICD-10-CM | POA: Diagnosis present

## 2023-11-28 DIAGNOSIS — I130 Hypertensive heart and chronic kidney disease with heart failure and stage 1 through stage 4 chronic kidney disease, or unspecified chronic kidney disease: Secondary | ICD-10-CM | POA: Diagnosis present

## 2023-11-28 DIAGNOSIS — Z9049 Acquired absence of other specified parts of digestive tract: Secondary | ICD-10-CM

## 2023-11-28 DIAGNOSIS — D631 Anemia in chronic kidney disease: Secondary | ICD-10-CM | POA: Diagnosis present

## 2023-11-28 DIAGNOSIS — E874 Mixed disorder of acid-base balance: Secondary | ICD-10-CM | POA: Diagnosis present

## 2023-11-28 DIAGNOSIS — I5032 Chronic diastolic (congestive) heart failure: Secondary | ICD-10-CM | POA: Diagnosis not present

## 2023-11-28 DIAGNOSIS — G8929 Other chronic pain: Secondary | ICD-10-CM | POA: Diagnosis present

## 2023-11-28 DIAGNOSIS — I5033 Acute on chronic diastolic (congestive) heart failure: Secondary | ICD-10-CM | POA: Diagnosis present

## 2023-11-28 DIAGNOSIS — K5731 Diverticulosis of large intestine without perforation or abscess with bleeding: Secondary | ICD-10-CM | POA: Diagnosis not present

## 2023-11-28 DIAGNOSIS — K429 Umbilical hernia without obstruction or gangrene: Secondary | ICD-10-CM | POA: Diagnosis present

## 2023-11-28 DIAGNOSIS — Z7951 Long term (current) use of inhaled steroids: Secondary | ICD-10-CM | POA: Diagnosis not present

## 2023-11-28 DIAGNOSIS — G4733 Obstructive sleep apnea (adult) (pediatric): Secondary | ICD-10-CM | POA: Diagnosis not present

## 2023-11-28 DIAGNOSIS — J9622 Acute and chronic respiratory failure with hypercapnia: Secondary | ICD-10-CM | POA: Diagnosis present

## 2023-11-28 DIAGNOSIS — N179 Acute kidney failure, unspecified: Secondary | ICD-10-CM | POA: Diagnosis present

## 2023-11-28 DIAGNOSIS — J9611 Chronic respiratory failure with hypoxia: Secondary | ICD-10-CM | POA: Diagnosis not present

## 2023-11-28 DIAGNOSIS — E662 Morbid (severe) obesity with alveolar hypoventilation: Secondary | ICD-10-CM | POA: Diagnosis present

## 2023-11-28 DIAGNOSIS — Z79899 Other long term (current) drug therapy: Secondary | ICD-10-CM

## 2023-11-28 DIAGNOSIS — J9602 Acute respiratory failure with hypercapnia: Secondary | ICD-10-CM | POA: Diagnosis not present

## 2023-11-28 DIAGNOSIS — I5043 Acute on chronic combined systolic (congestive) and diastolic (congestive) heart failure: Secondary | ICD-10-CM | POA: Diagnosis present

## 2023-11-28 DIAGNOSIS — Z91199 Patient's noncompliance with other medical treatment and regimen due to unspecified reason: Secondary | ICD-10-CM

## 2023-11-28 DIAGNOSIS — R9431 Abnormal electrocardiogram [ECG] [EKG]: Secondary | ICD-10-CM | POA: Diagnosis present

## 2023-11-28 DIAGNOSIS — K922 Gastrointestinal hemorrhage, unspecified: Secondary | ICD-10-CM | POA: Diagnosis not present

## 2023-11-28 DIAGNOSIS — K529 Noninfective gastroenteritis and colitis, unspecified: Secondary | ICD-10-CM | POA: Diagnosis present

## 2023-11-28 DIAGNOSIS — Z6841 Body Mass Index (BMI) 40.0 and over, adult: Secondary | ICD-10-CM

## 2023-11-28 DIAGNOSIS — D509 Iron deficiency anemia, unspecified: Secondary | ICD-10-CM | POA: Diagnosis not present

## 2023-11-28 DIAGNOSIS — Z8616 Personal history of COVID-19: Secondary | ICD-10-CM | POA: Diagnosis not present

## 2023-11-28 DIAGNOSIS — N1831 Chronic kidney disease, stage 3a: Secondary | ICD-10-CM | POA: Diagnosis present

## 2023-11-28 DIAGNOSIS — J441 Chronic obstructive pulmonary disease with (acute) exacerbation: Secondary | ICD-10-CM | POA: Diagnosis present

## 2023-11-28 DIAGNOSIS — Y92239 Unspecified place in hospital as the place of occurrence of the external cause: Secondary | ICD-10-CM | POA: Diagnosis not present

## 2023-11-28 DIAGNOSIS — J9601 Acute respiratory failure with hypoxia: Secondary | ICD-10-CM | POA: Diagnosis not present

## 2023-11-28 DIAGNOSIS — Z930 Tracheostomy status: Secondary | ICD-10-CM

## 2023-11-28 DIAGNOSIS — D62 Acute posthemorrhagic anemia: Secondary | ICD-10-CM | POA: Diagnosis present

## 2023-11-28 DIAGNOSIS — Z8711 Personal history of peptic ulcer disease: Secondary | ICD-10-CM

## 2023-11-28 DIAGNOSIS — E876 Hypokalemia: Secondary | ICD-10-CM | POA: Diagnosis present

## 2023-11-28 DIAGNOSIS — D500 Iron deficiency anemia secondary to blood loss (chronic): Secondary | ICD-10-CM | POA: Diagnosis not present

## 2023-11-28 DIAGNOSIS — N1832 Chronic kidney disease, stage 3b: Secondary | ICD-10-CM | POA: Diagnosis not present

## 2023-11-28 DIAGNOSIS — G9341 Metabolic encephalopathy: Secondary | ICD-10-CM | POA: Diagnosis present

## 2023-11-28 DIAGNOSIS — Z823 Family history of stroke: Secondary | ICD-10-CM

## 2023-11-28 DIAGNOSIS — F419 Anxiety disorder, unspecified: Secondary | ICD-10-CM | POA: Diagnosis present

## 2023-11-28 DIAGNOSIS — J9621 Acute and chronic respiratory failure with hypoxia: Secondary | ICD-10-CM | POA: Diagnosis present

## 2023-11-28 DIAGNOSIS — R262 Difficulty in walking, not elsewhere classified: Secondary | ICD-10-CM | POA: Diagnosis present

## 2023-11-28 DIAGNOSIS — Z86718 Personal history of other venous thrombosis and embolism: Secondary | ICD-10-CM

## 2023-11-28 DIAGNOSIS — D649 Anemia, unspecified: Secondary | ICD-10-CM

## 2023-11-28 DIAGNOSIS — R34 Anuria and oliguria: Secondary | ICD-10-CM | POA: Diagnosis present

## 2023-11-28 DIAGNOSIS — Z5986 Financial insecurity: Secondary | ICD-10-CM

## 2023-11-28 DIAGNOSIS — N189 Chronic kidney disease, unspecified: Secondary | ICD-10-CM | POA: Diagnosis not present

## 2023-11-28 DIAGNOSIS — Z87891 Personal history of nicotine dependence: Secondary | ICD-10-CM

## 2023-11-28 DIAGNOSIS — I890 Lymphedema, not elsewhere classified: Secondary | ICD-10-CM | POA: Diagnosis present

## 2023-11-28 DIAGNOSIS — T83031A Leakage of indwelling urethral catheter, initial encounter: Secondary | ICD-10-CM | POA: Diagnosis not present

## 2023-11-28 DIAGNOSIS — J9612 Chronic respiratory failure with hypercapnia: Secondary | ICD-10-CM | POA: Diagnosis not present

## 2023-11-28 DIAGNOSIS — G4730 Sleep apnea, unspecified: Secondary | ICD-10-CM | POA: Diagnosis present

## 2023-11-28 DIAGNOSIS — I959 Hypotension, unspecified: Secondary | ICD-10-CM | POA: Diagnosis not present

## 2023-11-28 LAB — BLOOD GAS, ARTERIAL
Acid-Base Excess: 15.5 mmol/L — ABNORMAL HIGH (ref 0.0–2.0)
Bicarbonate: 45.2 mmol/L — ABNORMAL HIGH (ref 20.0–28.0)
Delivery systems: POSITIVE
Expiratory PAP: 14 cmH2O
FIO2: 60 %
Inspiratory PAP: 20 cmH2O
O2 Saturation: 91.7 %
Patient temperature: 37
pCO2 arterial: 94 mmHg (ref 32–48)
pH, Arterial: 7.29 — ABNORMAL LOW (ref 7.35–7.45)
pO2, Arterial: 64 mmHg — ABNORMAL LOW (ref 83–108)

## 2023-11-28 LAB — CBC WITH DIFFERENTIAL/PLATELET
Abs Granulocyte: 4.5 K/uL (ref 1.5–6.5)
Abs Immature Granulocytes: 0.03 K/uL (ref 0.00–0.07)
Basophils Absolute: 0 K/uL (ref 0.0–0.1)
Basophils Relative: 0 %
Eosinophils Absolute: 0.2 K/uL (ref 0.0–0.5)
Eosinophils Relative: 3 %
HCT: 31.5 % — ABNORMAL LOW (ref 39.0–52.0)
Hemoglobin: 7.9 g/dL — ABNORMAL LOW (ref 13.0–17.0)
Immature Granulocytes: 0 %
Lymphocytes Relative: 16 %
Lymphs Abs: 1.1 K/uL (ref 0.7–4.0)
MCH: 20.1 pg — ABNORMAL LOW (ref 26.0–34.0)
MCHC: 25.1 g/dL — ABNORMAL LOW (ref 30.0–36.0)
MCV: 80.2 fL (ref 80.0–100.0)
Monocytes Absolute: 1 K/uL (ref 0.1–1.0)
Monocytes Relative: 14 %
Neutro Abs: 4.5 K/uL (ref 1.7–7.7)
Neutrophils Relative %: 67 %
Platelets: 200 K/uL (ref 150–400)
RBC: 3.93 MIL/uL — ABNORMAL LOW (ref 4.22–5.81)
RDW: 22.5 % — ABNORMAL HIGH (ref 11.5–15.5)
Smear Review: NORMAL
WBC: 6.8 K/uL (ref 4.0–10.5)
nRBC: 1.6 % — ABNORMAL HIGH (ref 0.0–0.2)

## 2023-11-28 LAB — CREATININE, SERUM
Creatinine, Ser: 1.71 mg/dL — ABNORMAL HIGH (ref 0.61–1.24)
GFR, Estimated: 44 mL/min — ABNORMAL LOW (ref >60)

## 2023-11-28 LAB — CBC
HCT: 29.9 % — ABNORMAL LOW (ref 39.0–52.0)
Hemoglobin: 7.6 g/dL — ABNORMAL LOW (ref 13.0–17.0)
MCH: 20.4 pg — ABNORMAL LOW (ref 26.0–34.0)
MCHC: 25.4 g/dL — ABNORMAL LOW (ref 30.0–36.0)
MCV: 80.4 fL (ref 80.0–100.0)
Platelets: 182 K/uL (ref 150–400)
RBC: 3.72 MIL/uL — ABNORMAL LOW (ref 4.22–5.81)
RDW: 22.3 % — ABNORMAL HIGH (ref 11.5–15.5)
WBC: 6.7 K/uL (ref 4.0–10.5)
nRBC: 1.6 % — ABNORMAL HIGH (ref 0.0–0.2)

## 2023-11-28 LAB — COMPREHENSIVE METABOLIC PANEL WITH GFR
ALT: 11 U/L (ref 0–44)
AST: 15 U/L (ref 15–41)
Albumin: 2.4 g/dL — ABNORMAL LOW (ref 3.5–5.0)
Alkaline Phosphatase: 61 U/L (ref 38–126)
Anion gap: 6 (ref 5–15)
BUN: 35 mg/dL — ABNORMAL HIGH (ref 8–23)
CO2: 36 mmol/L — ABNORMAL HIGH (ref 22–32)
Calcium: 7.6 mg/dL — ABNORMAL LOW (ref 8.9–10.3)
Chloride: 99 mmol/L (ref 98–111)
Creatinine, Ser: 1.52 mg/dL — ABNORMAL HIGH (ref 0.61–1.24)
GFR, Estimated: 50 mL/min — ABNORMAL LOW (ref >60)
Glucose, Bld: 89 mg/dL (ref 70–99)
Potassium: 3.7 mmol/L (ref 3.5–5.1)
Sodium: 141 mmol/L (ref 135–145)
Total Bilirubin: 0.7 mg/dL (ref 0.0–1.2)
Total Protein: 7.4 g/dL (ref 6.5–8.1)

## 2023-11-28 LAB — BLOOD GAS, VENOUS
Acid-Base Excess: 14.1 mmol/L — ABNORMAL HIGH (ref 0.0–2.0)
Bicarbonate: 45.2 mmol/L — ABNORMAL HIGH (ref 20.0–28.0)
O2 Saturation: 64.6 %
Patient temperature: 37
pCO2, Ven: 94 mmHg (ref 44–60)
pH, Ven: 7.29 (ref 7.25–7.43)
pO2, Ven: 40 mmHg (ref 32–45)

## 2023-11-28 LAB — VITAMIN B12: Vitamin B-12: 389 pg/mL (ref 180–914)

## 2023-11-28 LAB — IRON AND TIBC
Iron: 21 ug/dL — ABNORMAL LOW (ref 45–182)
Saturation Ratios: 6 % — ABNORMAL LOW (ref 17.9–39.5)
TIBC: 328 ug/dL (ref 250–450)
UIBC: 307 ug/dL

## 2023-11-28 LAB — TROPONIN I (HIGH SENSITIVITY)
Troponin I (High Sensitivity): 9 ng/L
Troponin I (High Sensitivity): 9 ng/L (ref <18)
Troponin I (High Sensitivity): 7 ng/L (ref <18)

## 2023-11-28 LAB — BRAIN NATRIURETIC PEPTIDE
B Natriuretic Peptide: 59.9 pg/mL (ref 0.0–100.0)
B Natriuretic Peptide: 60 pg/mL (ref 0.0–100.0)

## 2023-11-28 LAB — RETICULOCYTES
Immature Retic Fract: 38.3 % — ABNORMAL HIGH (ref 2.3–15.9)
RBC.: 3.82 MIL/uL — ABNORMAL LOW (ref 4.22–5.81)
Retic Count, Absolute: 104.7 K/uL (ref 19.0–186.0)
Retic Ct Pct: 2.7 % (ref 0.4–3.1)

## 2023-11-28 LAB — FERRITIN: Ferritin: 10 ng/mL — ABNORMAL LOW (ref 24–336)

## 2023-11-28 LAB — FOLATE: Folate: 12 ng/mL

## 2023-11-28 MED ORDER — METHYLPREDNISOLONE SODIUM SUCC 40 MG IJ SOLR
40.0000 mg | Freq: Two times a day (BID) | INTRAMUSCULAR | Status: AC
  Administered 2023-11-29 – 2023-12-01 (×6): 40 mg via INTRAVENOUS
  Filled 2023-11-28 (×6): qty 1

## 2023-11-28 MED ORDER — ALBUTEROL SULFATE (2.5 MG/3ML) 0.083% IN NEBU: 2.5000 mg | INHALATION_SOLUTION | RESPIRATORY_TRACT | Status: DC | PRN

## 2023-11-28 MED ORDER — HEPARIN SODIUM (PORCINE) 5000 UNIT/ML IJ SOLN
5000.0000 [IU] | Freq: Three times a day (TID) | INTRAMUSCULAR | Status: DC
  Administered 2023-11-28 – 2023-12-05 (×19): 5000 [IU] via SUBCUTANEOUS
  Filled 2023-11-28 (×17): qty 1

## 2023-11-28 MED ORDER — PREDNISONE 20 MG PO TABS
40.0000 mg | ORAL_TABLET | Freq: Every day | ORAL | Status: AC
  Administered 2023-12-02 – 2023-12-05 (×4): 40 mg via ORAL
  Filled 2023-11-28 (×4): qty 2

## 2023-11-28 MED ORDER — IPRATROPIUM-ALBUTEROL 0.5-2.5 (3) MG/3ML IN SOLN
6.0000 mL | Freq: Once | RESPIRATORY_TRACT | Status: AC
  Administered 2023-11-28: 6 mL via RESPIRATORY_TRACT
  Filled 2023-11-28: qty 3

## 2023-11-28 MED ORDER — ACETAMINOPHEN 650 MG RE SUPP: 650.0000 mg | Freq: Four times a day (QID) | RECTAL | Status: DC | PRN

## 2023-11-28 MED ORDER — FUROSEMIDE 10 MG/ML IJ SOLN: 60.0000 mg | Freq: Two times a day (BID) | INTRAMUSCULAR | Status: DC

## 2023-11-28 MED ORDER — METHYLPREDNISOLONE SODIUM SUCC 40 MG IJ SOLR: 40.0000 mg | Freq: Two times a day (BID) | INTRAMUSCULAR | Status: DC

## 2023-11-28 MED ORDER — ONDANSETRON HCL 4 MG PO TABS: 4.0000 mg | ORAL_TABLET | Freq: Four times a day (QID) | ORAL | Status: DC | PRN

## 2023-11-28 MED ORDER — METHYLPREDNISOLONE SODIUM SUCC 125 MG IJ SOLR
125.0000 mg | Freq: Once | INTRAMUSCULAR | Status: AC
  Administered 2023-11-28: 125 mg via INTRAVENOUS
  Filled 2023-11-28: qty 2

## 2023-11-28 MED ORDER — LEVOFLOXACIN IN D5W 750 MG/150ML IV SOLN
750.0000 mg | INTRAVENOUS | Status: DC
  Administered 2023-11-28 – 2023-11-29 (×2): 750 mg via INTRAVENOUS
  Filled 2023-11-28 (×2): qty 150

## 2023-11-28 MED ORDER — FUROSEMIDE 10 MG/ML IJ SOLN
60.0000 mg | Freq: Once | INTRAMUSCULAR | Status: AC
  Administered 2023-11-28: 60 mg via INTRAVENOUS
  Filled 2023-11-28: qty 8

## 2023-11-28 MED ORDER — ACETAMINOPHEN 325 MG PO TABS
650.0000 mg | ORAL_TABLET | Freq: Four times a day (QID) | ORAL | Status: DC | PRN
  Administered 2023-12-01 – 2023-12-16 (×6): 650 mg via ORAL
  Filled 2023-11-28 (×7): qty 2

## 2023-11-28 MED ORDER — IPRATROPIUM-ALBUTEROL 0.5-2.5 (3) MG/3ML IN SOLN
3.0000 mL | Freq: Four times a day (QID) | RESPIRATORY_TRACT | Status: DC
  Administered 2023-11-28 – 2023-11-29 (×5): 3 mL via RESPIRATORY_TRACT
  Filled 2023-11-28 (×5): qty 3

## 2023-11-28 MED ORDER — ONDANSETRON HCL 4 MG/2ML IJ SOLN: 4.0000 mg | Freq: Four times a day (QID) | INTRAMUSCULAR | Status: DC | PRN

## 2023-11-28 NOTE — ED Notes (Signed)
 Pt yelling for nurse; this RN to bedside. Pt requesting repositioning and to be suctioned. RT called to bedside. Pt given water  per request. Repositioned in bed and educated about call light system.   RT at bedside

## 2023-11-28 NOTE — ED Notes (Signed)
RT called to transport pt to CT. 

## 2023-11-28 NOTE — ED Notes (Signed)
 Pt provided w/ warm blanket per request.

## 2023-11-28 NOTE — ED Provider Notes (Signed)
 [facility] Provider Note    Event Date/Time   First MD Initiated Contact with Patient 11/28/23 1349     (approximate)   History   Shortness of Breath   HPI  Gary [name] is a 68 year old male with history of CHF, AAA, COPD, anemia, chronic trach presenting to the emergency department for evaluation of shortness of breath.  Patient reports he recently transitioned from rehab to [facility].  He reports he had been wearing 4 to 6 L of oxygen  during the day and CPAP at night.  However, his facility does not have CPAP.  He was found to be hypoxic with sats in the lower 70s when EMS was called.  He was placed on a nonrebreather.    Physical Exam   Triage Vital Signs: ED Triage Vitals  Encounter Vitals Group     BP 11/28/23 1349 114/62     Girls Systolic BP Percentile --      Girls Diastolic BP Percentile --      Boys Systolic BP Percentile --      Boys Diastolic BP Percentile --      Pulse Rate 11/28/23 1349 88     Resp 11/28/23 1349 (!) 31     Temp 11/28/23 1349 97.7 F (36.5 C)     Temp Source 11/28/23 1349 Oral     SpO2 11/28/23 1349 93 %     Weight 11/28/23 1347 (!) 435 lb 6.5 oz (197.5 kg)     Height 11/28/23 1347 6' 4 (1.93 m)     Head Circumference --      Peak Flow --      Pain Score 11/28/23 1346 5     Pain Loc --      Pain Education --      Exclude from Growth Chart --     Most recent vital signs: Vitals:   11/28/23 1430 11/28/23 1500  BP: 116/66 119/63  Pulse:    Resp:    Temp:    SpO2:       General: Awake, interactive  CV:  Regular rate, good peripheral perfusion.  Resp:  Mildly labored respirations, diffuse wheezing with fair air movement, trach collar in place, not connected to oxygen  Abd:  Nondistended Neuro:  Symmetric facial movement, fluid speech   ED Results / Procedures / Treatments   Labs (all labs ordered are listed, but only abnormal results are displayed) Labs Reviewed  CBC WITH  DIFFERENTIAL/PLATELET - Abnormal; Notable for the following components:      Result Value   RBC 3.93 (*)    Hemoglobin 7.9 (*)    HCT 31.5 (*)    MCH 20.1 (*)    MCHC 25.1 (*)    RDW 22.5 (*)    nRBC 1.6 (*)    All other components within normal limits  COMPREHENSIVE METABOLIC PANEL WITH GFR - Abnormal; Notable for the following components:   CO2 36 (*)    BUN 35 (*)    Creatinine, Ser 1.52 (*)    Calcium  7.6 (*)    Albumin  2.4 (*)    GFR, Estimated 50 (*)    All other components within normal limits     EKG EKG independently reviewed and interpreted by myself demonstrates:    RADIOLOGY Imaging independently reviewed and interpreted by myself demonstrates:  CXR with pulmonary vascular congestion, similar to prior on my review  Formal Radiology Read:  DG Chest Portable 1 View Result Date: 11/28/2023 CLINICAL DATA:  Shortness of breath. EXAM: PORTABLE CHEST 1 VIEW COMPARISON:  10/17/2023. FINDINGS: Low lung volume. Mild-to-moderate diffuse pulmonary vascular congestion, likely accentuated by low lung volume. Bilateral lung fields are clear. Bilateral costophrenic angles are clear. Stable cardio-mediastinal silhouette. No acute osseous abnormalities. The soft tissues are within normal limits. Tracheostomy tube is seen with its tip approximately 5.1 cm above the carina. IMPRESSION: *Mild-to-moderate diffuse pulmonary vascular congestion, likely accentuated by low lung volume. Findings concerning for underlying congestive heart failure/pulmonary edema. Correlate clinically. Electronically Signed   By: Ree Molt M.D.   On: 11/28/2023 14:47    PROCEDURES:  Critical Care performed: No  Procedures   MEDICATIONS ORDERED IN ED: Medications  methylPREDNISolone  sodium succinate (SOLU-MEDROL ) 125 mg/2 mL injection 125 mg (125 mg Intravenous Given 11/28/23 1421)  ipratropium-albuterol  (DUONEB) 0.5-2.5 (3) MG/3ML nebulizer solution 6 mL (6 mLs Nebulization Given 11/28/23 1423)      IMPRESSION / MDM / ASSESSMENT AND PLAN / ED COURSE  I reviewed the triage vital signs and the nursing notes.  Differential diagnosis includes, but is not limited to, COPD exacerbation, pneumonia, pneumothorax  Patient's presentation is most consistent with acute presentation with potential threat to life or bodily function.  68 year old male presenting with shortness of breath.  Complicated past medical history, but it does appear that more recently uses nasal cannula oxygen  during the day, CPAP at night.  He reports his trach collar is not being used.  He does follow with Dr. Daphane with pulmonology.  I did speak with Dr. DELENA on the phone regarding the patient.  Given his hypoxia on baseline oxygen , suspect likely COPD exacerbation which is consistent with patient's clinical presentation.  He does recommend treatment for this, admission to hospitalist service for further management.  Labs here demonstrate stable anemia, renal impairment similar to prior.  Will reach out to hospitalist team to discuss admission.      FINAL CLINICAL IMPRESSION(S) / ED DIAGNOSES   Final diagnoses:  COPD exacerbation ([name])  Acute on chronic hypoxic respiratory failure ([name])     Rx / DC Orders   ED Discharge Orders     None        Note:  This document was prepared using Dragon voice recognition software and may include unintentional dictation errors.   Levander Slate, MD 11/28/23 [phone]

## 2023-11-28 NOTE — ED Notes (Signed)
 Pt returned to room, respiratory swab collected, green top collected and sent to lab. Pt's foley cath emptied.... 900 mL output noted. Pt resting w/ eyes clothes..will wake by light touch.. denies needs at this time. Call light within reach

## 2023-11-28 NOTE — ED Notes (Signed)
 Green, lav, and venous blood gas collected and sent to lab. RT notified.

## 2023-11-28 NOTE — ED Notes (Signed)
 RT at bedside.

## 2023-11-28 NOTE — ED Notes (Signed)
 Pt transported to radiology w/ RT.
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
## 2023-11-28 NOTE — ED Notes (Signed)
 Pt sleeping sat in 85-97%. O2 increased to 5L. Spo2 increased to 95%

## 2023-11-28 NOTE — ED Triage Notes (Signed)
 Pt BIB [facility] from [facility] care. Pt was recently discharged from a rehab facility in [place] and is a new admission at [facility]. Pt requires 4-6L o2 @ baseline and Bipap which [facility] does not have. EMS was called due to o2 sats in the lower 70's. Pt usually gets 5-6 breathing treatments a day. EMS placed pt on NRB AT 89-90%.
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
## 2023-11-28 NOTE — ED Notes (Signed)
 RT contacted for neb tx.

## 2023-11-28 NOTE — ED Notes (Signed)
 [place], NP and RT at bedside

## 2023-11-28 NOTE — ED Notes (Signed)
 Pt sat in 63-78 range on BIPAP. Rt notified, MD notified.

## 2023-11-28 NOTE — Progress Notes (Signed)
 Transported pt to CT via servo-air with noted settings w/RN . Pt transported well with no issues or distress. Rt will cont to follow.

## 2023-11-28 NOTE — ED Notes (Signed)
 Pt provided with snack by this RN.

## 2023-11-29 ENCOUNTER — Encounter: Payer: Self-pay | Admitting: Internal Medicine

## 2023-11-29 DIAGNOSIS — G9341 Metabolic encephalopathy: Secondary | ICD-10-CM | POA: Diagnosis not present

## 2023-11-29 DIAGNOSIS — J441 Chronic obstructive pulmonary disease with (acute) exacerbation: Secondary | ICD-10-CM | POA: Diagnosis not present

## 2023-11-29 DIAGNOSIS — G4733 Obstructive sleep apnea (adult) (pediatric): Secondary | ICD-10-CM

## 2023-11-29 DIAGNOSIS — D649 Anemia, unspecified: Secondary | ICD-10-CM

## 2023-11-29 DIAGNOSIS — I5033 Acute on chronic diastolic (congestive) heart failure: Secondary | ICD-10-CM | POA: Diagnosis not present

## 2023-11-29 DIAGNOSIS — J9621 Acute and chronic respiratory failure with hypoxia: Secondary | ICD-10-CM | POA: Diagnosis not present

## 2023-11-29 DIAGNOSIS — I959 Hypotension, unspecified: Secondary | ICD-10-CM

## 2023-11-29 DIAGNOSIS — N1831 Chronic kidney disease, stage 3a: Secondary | ICD-10-CM

## 2023-11-29 DIAGNOSIS — K529 Noninfective gastroenteritis and colitis, unspecified: Secondary | ICD-10-CM

## 2023-11-29 LAB — COMPREHENSIVE METABOLIC PANEL WITH GFR
ALT: 10 U/L (ref 0–44)
AST: 20 U/L (ref 15–41)
Albumin: 2.5 g/dL — ABNORMAL LOW (ref 3.5–5.0)
Alkaline Phosphatase: 69 U/L (ref 38–126)
Anion gap: 10 (ref 5–15)
BUN: 33 mg/dL — ABNORMAL HIGH (ref 8–23)
CO2: 33 mmol/L — ABNORMAL HIGH (ref 22–32)
Calcium: 7.9 mg/dL — ABNORMAL LOW (ref 8.9–10.3)
Chloride: 99 mmol/L (ref 98–111)
Creatinine, Ser: 1.66 mg/dL — ABNORMAL HIGH (ref 0.61–1.24)
GFR, Estimated: 45 mL/min — ABNORMAL LOW (ref >60)
Glucose, Bld: 147 mg/dL — ABNORMAL HIGH (ref 70–99)
Potassium: 3.6 mmol/L (ref 3.5–5.1)
Sodium: 142 mmol/L (ref 135–145)
Total Bilirubin: 0.7 mg/dL (ref 0.0–1.2)
Total Protein: 7.4 g/dL (ref 6.5–8.1)

## 2023-11-29 LAB — IRON AND TIBC
Iron: 19 ug/dL — ABNORMAL LOW (ref 45–182)
Saturation Ratios: 6 % — ABNORMAL LOW (ref 17.9–39.5)
TIBC: 305 ug/dL (ref 250–450)
UIBC: 286 ug/dL

## 2023-11-29 LAB — BLOOD GAS, VENOUS
Acid-Base Excess: 14 mmol/L — ABNORMAL HIGH (ref 0.0–2.0)
Acid-Base Excess: 11.1 mmol/L — ABNORMAL HIGH (ref 0.0–2.0)
Bicarbonate: 42.1 mmol/L — ABNORMAL HIGH (ref 20.0–28.0)
Bicarbonate: 40.7 mmol/L — ABNORMAL HIGH (ref 20.0–28.0)
O2 Content: 2 L/min
O2 Saturation: 83.1 %
O2 Saturation: 60.3 %
Patient temperature: 37
Patient temperature: 37
pCO2, Ven: 68 mmHg — ABNORMAL HIGH (ref 44–60)
pCO2, Ven: 79 mmHg (ref 44–60)
pH, Ven: 7.4 (ref 7.25–7.43)
pH, Ven: 7.32 (ref 7.25–7.43)
pO2, Ven: 49 mmHg — ABNORMAL HIGH (ref 32–45)
pO2, Ven: 39 mmHg (ref 32–45)

## 2023-11-29 LAB — RESPIRATORY PANEL BY PCR

## 2023-11-29 LAB — CBC
HCT: 29.1 % — ABNORMAL LOW (ref 39.0–52.0)
Hemoglobin: 7.5 g/dL — ABNORMAL LOW (ref 13.0–17.0)
MCH: 20.4 pg — ABNORMAL LOW (ref 26.0–34.0)
MCHC: 25.8 g/dL — ABNORMAL LOW (ref 30.0–36.0)
MCV: 79.1 fL — ABNORMAL LOW (ref 80.0–100.0)
Platelets: 206 K/uL (ref 150–400)
RBC: 3.68 MIL/uL — ABNORMAL LOW (ref 4.22–5.81)
RDW: 21.6 % — ABNORMAL HIGH (ref 11.5–15.5)
WBC: 6.7 K/uL (ref 4.0–10.5)
nRBC: 1.3 % — ABNORMAL HIGH (ref 0.0–0.2)

## 2023-11-29 LAB — RETICULOCYTES
Immature Retic Fract: 37 % — ABNORMAL HIGH (ref 2.3–15.9)
RBC.: 3.51 MIL/uL — ABNORMAL LOW (ref 4.22–5.81)
Retic Count, Absolute: 102.1 K/uL (ref 19.0–186.0)
Retic Ct Pct: 2.9 % (ref 0.4–3.1)

## 2023-11-29 LAB — PROCALCITONIN: Procalcitonin: 0.2 ng/mL

## 2023-11-29 LAB — TROPONIN I (HIGH SENSITIVITY): Troponin I (High Sensitivity): 7 ng/L (ref <18)

## 2023-11-29 MED ORDER — IRON SUCROSE 200 MG IVPB - SIMPLE MED
200.0000 mg | Freq: Once | Status: AC
  Administered 2023-11-29: 200 mg via INTRAVENOUS
  Filled 2023-11-29: qty 200

## 2023-11-29 MED ORDER — MORPHINE SULFATE (PF) 2 MG/ML IV SOLN
2.0000 mg | Freq: Once | INTRAVENOUS | Status: AC
  Administered 2023-11-29: 2 mg via INTRAVENOUS
  Filled 2023-11-29: qty 1

## 2023-11-29 MED ORDER — FUROSEMIDE 10 MG/ML IJ SOLN: 40.0000 mg | Freq: Every day | INTRAMUSCULAR | Status: DC

## 2023-11-29 MED ORDER — FUROSEMIDE 10 MG/ML IJ SOLN: 40.0000 mg | Freq: Two times a day (BID) | INTRAMUSCULAR | Status: DC

## 2023-11-29 MED ORDER — PREGABALIN 25 MG PO CAPS
50.0000 mg | ORAL_CAPSULE | Freq: Every day | ORAL | Status: DC
  Administered 2023-11-29 – 2023-12-21 (×23): 50 mg via ORAL
  Filled 2023-11-29 (×23): qty 1

## 2023-11-29 MED ORDER — SPIRONOLACTONE 25 MG PO TABS
50.0000 mg | ORAL_TABLET | Freq: Every day | ORAL | Status: DC
  Administered 2023-11-29 – 2023-12-03 (×5): 50 mg via ORAL
  Filled 2023-11-29 (×5): qty 2

## 2023-11-29 MED ORDER — IPRATROPIUM-ALBUTEROL 0.5-2.5 (3) MG/3ML IN SOLN
3.0000 mL | Freq: Two times a day (BID) | RESPIRATORY_TRACT | Status: DC
  Administered 2023-11-30 – 2023-12-22 (×44): 3 mL via RESPIRATORY_TRACT
  Filled 2023-11-29 (×45): qty 3

## 2023-11-29 MED ORDER — MIDODRINE HCL 5 MG PO TABS
10.0000 mg | ORAL_TABLET | Freq: Three times a day (TID) | ORAL | Status: DC
  Administered 2023-11-29 – 2023-12-04 (×16): 10 mg via ORAL
  Filled 2023-11-29 (×17): qty 2

## 2023-11-29 MED ORDER — CHLORHEXIDINE GLUCONATE CLOTH 2 % EX PADS
6.0000 | MEDICATED_PAD | Freq: Every day | CUTANEOUS | Status: DC
  Administered 2023-11-29 – 2023-12-01 (×2): 6 via TOPICAL

## 2023-11-29 MED ORDER — TORSEMIDE 20 MG PO TABS
40.0000 mg | ORAL_TABLET | Freq: Every day | ORAL | Status: DC
  Administered 2023-11-29: 40 mg via ORAL
  Filled 2023-11-29: qty 2

## 2023-11-29 MED ORDER — PANTOPRAZOLE SODIUM 40 MG PO TBEC
40.0000 mg | DELAYED_RELEASE_TABLET | Freq: Every day | ORAL | Status: DC
  Administered 2023-11-29 – 2023-12-04 (×6): 40 mg via ORAL
  Filled 2023-11-29 (×6): qty 1

## 2023-11-29 MED ORDER — FLUTICASONE FUROATE-VILANTEROL 100-25 MCG/ACT IN AEPB
1.0000 | INHALATION_SPRAY | Freq: Every day | RESPIRATORY_TRACT | Status: DC
  Administered 2023-11-29 – 2023-12-22 (×24): 1 via RESPIRATORY_TRACT
  Filled 2023-11-29 (×3): qty 28

## 2023-11-29 MED ORDER — FUROSEMIDE 10 MG/ML IJ SOLN
40.0000 mg | Freq: Two times a day (BID) | INTRAMUSCULAR | Status: DC
  Administered 2023-11-29 – 2023-12-01 (×4): 40 mg via INTRAVENOUS
  Filled 2023-11-29 (×4): qty 4

## 2023-11-29 MED ORDER — FE FUM-VIT C-VIT B12-FA 460-60-0.01-1 MG PO CAPS
1.0000 | ORAL_CAPSULE | Freq: Two times a day (BID) | ORAL | Status: DC
  Administered 2023-11-29 – 2023-12-21 (×45): 1 via ORAL
  Filled 2023-11-29 (×57): qty 1

## 2023-11-29 MED ORDER — MESALAMINE 1.2 G PO TBEC
1.2000 g | DELAYED_RELEASE_TABLET | Freq: Two times a day (BID) | ORAL | Status: DC
  Administered 2023-11-29 – 2023-12-21 (×46): 1.2 g via ORAL
  Filled 2023-11-29 (×54): qty 1

## 2023-11-29 NOTE — Assessment & Plan Note (Signed)
 Likely secondary to not using BiPAP as recommended.  Patient has a chronic trach and uses 6 L of oxygen  at baseline.  - Improved back to baseline now Mildly elevated procalcitonin so continuing-Levaquin  for 5 days -Continue with supplemental oxygen  -Continue with BiPAP at night and while sleeping -Continue with supportive care

## 2023-11-29 NOTE — ED Notes (Signed)
 RN notified phlebotomy for lab collection

## 2023-11-29 NOTE — ED Notes (Signed)
 Pt placed back on bipap. RT contacted.

## 2023-11-29 NOTE — ED Notes (Signed)
 VBG drawn and sent to lab, RT made aware, pt repositioned in bed.

## 2023-11-29 NOTE — ED Notes (Signed)
 Pt had BM, cleaned, and dirty linen removed. Pt repositioned in bed w/ call light within reach. Pt denies other needs at this time. See MAR to address pain concern.

## 2023-11-29 NOTE — ED Notes (Signed)
 This tech assisted pt on bed pan. Chux changed and pt cleaned up.Provided clean blankets and ice water  for pt. Pt stated no other needs at this time.

## 2023-11-29 NOTE — ED Notes (Signed)
 Pt called out requesting his oxycodone  and stating the morphine  did not help. Debby, MD messaged. Awaiting response.

## 2023-11-29 NOTE — Assessment & Plan Note (Signed)
 Hemoglobin at 7.6 with no obvious bleeding.  Anemia panel with anemia of chronic disease, iron  deficiency and B12 of 380 goal above 400. History of prior GI bleed. - Check FOBT -Ordered IV iron  x 1 dose -Starting on p.o. supplement -Monitor hemoglobin -Transfuse if below 7

## 2023-11-29 NOTE — ED Notes (Addendum)
 Pt refusing to wear bipap, pt placed on [place] at this time @ 7LPM, pt wants something to eat. Koren has been requested from cafeteria. Pt will remain on [place] and Will attempt putting him back on bipap after he eats. RT aware

## 2023-11-29 NOTE — ED Notes (Signed)
 Call light answered by this RN. Pt requesting to be repositioned in bed. This RN and Alexia repositioned pt in bed and gave pt his phone charger. Pt is requesting to take Bipap off so that he can eat. RN informed pt we will check with attending and see if he is able to eat.

## 2023-11-29 NOTE — Assessment & Plan Note (Signed)
 Likely secondary to significant hypercapnia on presentation, mentation now improved back to baseline after using BiPAP and improvement in hypercapnia. - Continue to monitor

## 2023-11-29 NOTE — ED Notes (Signed)
 Pt provided w/ sandwich per request. Holding morphine  since pt is currently on [place]. One placed back on bipap, will administer pain medicine. Pt verbalizes understanding

## 2023-11-29 NOTE — Hospital Course (Addendum)
 Taken from prior notes.   Gary [name] is a 68 y.o. male with medical history significant of heart failure preserved ejection fraction, hx of  DVT s/p treatment , AAA, COPD, hypertension, anemia, chronic hypoxic and hypercapnic respiratory failure on chronic trach ([place]iley XLT #7 ) on [place] 5-6L during the day and bipap at bedtime. Patient  presents to ED  from  rehab facility in [place]. EMS was called due sat in 70's . Per notes patient uses Bipap but facility does not provide this therapy.   Patient was found to be with acute on chronic hypoxic and hypercapnic respiratory failure and was placed on BiPAP.  Labs with worsening anemia with hemoglobin at 7.6.  Received IV Lasix , he also received IV iron .   He had multiple maroon colored Bms with clot mixed in.  He received 1 units of PRBC, seen by GI, not recommended any additional workup.  Patient has restarted IV Lasix  since 9/5 due to volume overload.  He developed worsening respite failure with an responsiveness.  ABG showed severe CO2 retention, patient be transferred to ICU 9/7.

## 2023-11-29 NOTE — ED Notes (Signed)
 Pt provided w/ warm blanket per request. Pt requesting oxycodone  due to pain all over 8/10. Jesus, NP contacted, awaiting response.

## 2023-11-29 NOTE — Assessment & Plan Note (Signed)
 Continue BiPAP at night

## 2023-11-29 NOTE — Assessment & Plan Note (Signed)
 Mild AKI with creatinine at 1.71, baseline around 1.2-1.3 History of CKD stage III A. Small improvement in creatinine to 1.66 today after getting 1 dose of IV Lasix  yesterday. - Monitor renal function closely as patient is getting diuresed -Avoid nephrotoxins

## 2023-11-29 NOTE — ED Notes (Signed)
 Pt had BM. Pt cleaned. Pt will not stop self adjusting face mask.

## 2023-11-29 NOTE — Assessment & Plan Note (Signed)
 Estimated body mass index is 53 kg/m as calculated from the following:   Height as of this encounter: 6' 4 (1.93 m).   Weight as of this encounter: 197.5 kg.   - This will complicate overall prognosis

## 2023-11-29 NOTE — TOC CM/SW Note (Signed)
..  Transition of Care [facility]) - Inpatient Brief Assessment   Patient Details  Name: Gary [name] MRN: [id number] Date of Birth: 1955-06-07  [facility] CM/SW Contact:    Edsel DELENA Fischer, LCSW Phone Number: 11/29/2023, 4:53 PM   Clinical Narrative:  SW received message from Dr. Caleen regarding pt.  SW spoke with Massie from [facility].  Pt is able to return to facility.  Austin asked about new setting for Bipap because pt oxygen  is still low when he uses bipap.  SW relied message to Dr. Caleen  Transition of Care Asessment:

## 2023-11-29 NOTE — Assessment & Plan Note (Signed)
-  Continue home mesalamine

## 2023-11-29 NOTE — ED Notes (Signed)
 Pt called out requesting his foley cath be taped to his leg. Tubing secured at this time.

## 2023-11-29 NOTE — Assessment & Plan Note (Signed)
 Patient with significant lower extremity edema, BNP normal. - Switching home torsemide  with IV Lasix  -Monitor renal function closely while taking IV Lasix  -Strict intake and output

## 2023-11-29 NOTE — Assessment & Plan Note (Signed)
 No wheezing today. -Continue with steroid taper - Continue with bronchodilator and supportive care

## 2023-11-29 NOTE — Assessment & Plan Note (Signed)
 Blood pressure currently within goal. - Continue home midodrine 

## 2023-11-29 NOTE — ED Notes (Addendum)
 CCMD called RN to notify that EKG leads were off pt. RN went into room to place leads back on pt, pt sts  Dr. DELENA was just in here and he is getting me a bed and I dont need them on since I am leaving. RN explained to pt that he still needs them on. Pt covered himself back up and sts  I am leaving soon

## 2023-11-29 NOTE — Progress Notes (Signed)
 Progress Note   Patient: Gary [name] FMW:[id number] DOB: 08/16/1955 DOA: 11/28/2023     1 DOS: the patient was seen and examined on 11/29/2023   Brief hospital course: Taken from prior notes.   GIAVONNI FONDER is a 68 y.o. male with medical history significant of heart failure preserved ejection fraction, hx of  DVT s/p treatment , AAA, COPD, hypertension, anemia, chronic hypoxic and hypercapnic respiratory failure on chronic trach (Shiley XLT #7 ) on [place] 5-6L during the day and bipap at bedtime. Patient  presents to ED  from  rehab facility in [place]. EMS was called due sat in 70's . Per notes patient uses Bipap but facility does not provide this therapy.   Patient has frequent multiple hospitalization most recent from 10/11/2023-10/26/23 with diagnosis of acute on chronic hypoxic and hypercapnic respiratory failure,sepsis secondary to Pseudomonas  Pneumonia complicated by  Pseudomonas bacteremia in addition to AKI on CKDIIIa.  Of note s/p hospitalization patient was discharged to [facility] and thereafter he was discharged to SNF.   Patient was found to be with acute on chronic hypoxic and hypercapnic respiratory failure and was placed on BiPAP.  Initial chest x-ray concerning for pulmonary vascular congestion so he was given a dose of IV Lasix . CT chest with worsening atelectatic changes in right upper lobe, and no other focal abnormality.  Labs with worsening anemia with hemoglobin at 7.6-anemia panel ordered.  Respiratory viral panel negative, BNP normal at 59.  BUN 35 and creatinine at 1.52 with baseline around 1.2-1.3-not meeting the criteria for AKI.  8/15: Vital stable on BiPAP.  Anemia panel with iron  deficiency, B12 389 and folate of 12.  Ordering 1 dose of IV iron  followed by starting on p.o. supplement.  Procalcitonin at 0.20, creatinine with some improvement to 1.66.  Starting him on twice daily IV Lasix  due to significant lower extremity edema.  Assessment and Plan: * Acute on  chronic respiratory failure with hypoxia and hypercapnia ([name]) Likely secondary to not using BiPAP as recommended.  Patient has a chronic trach and uses 6 L of oxygen  at baseline.  - Improved back to baseline now Mildly elevated procalcitonin so continuing-Levaquin  for 5 days -Continue with supplemental oxygen  -Continue with BiPAP at night and while sleeping -Continue with supportive care  Acute metabolic encephalopathy Likely secondary to significant hypercapnia on presentation, mentation now improved back to baseline after using BiPAP and improvement in hypercapnia. - Continue to monitor  COPD exacerbation ([name]) No wheezing today. -Continue with steroid taper - Continue with bronchodilator and supportive care  Acute on chronic heart failure with preserved ejection fraction (HFpEF) ([name]) Patient with significant lower extremity edema, BNP normal. - Switching home torsemide  with IV Lasix  -Monitor renal function closely while taking IV Lasix  -Strict intake and output  CKD stage 3a, GFR 45-59 ml/min ([name]) Mild AKI with creatinine at 1.71, baseline around 1.2-1.3 History of CKD stage III A. Small improvement in creatinine to 1.66 today after getting 1 dose of IV Lasix  yesterday. - Monitor renal function closely as patient is getting diuresed -Avoid nephrotoxins  Chronic colitis - Continue home mesalamine   Normocytic anemia Hemoglobin at 7.6 with no obvious bleeding.  Anemia panel with anemia of chronic disease, iron  deficiency and B12 of 380 goal above 400. History of prior GI bleed. - Check FOBT -Ordered IV iron  x 1 dose -Starting on p.o. supplement -Monitor hemoglobin -Transfuse if below 7  Sleep apnea - Continue BiPAP at night  Morbid obesity ([name]) Estimated body mass index  is 53 kg/m as calculated from the following:   Height as of this encounter: 6' 4 (1.93 m).   Weight as of this encounter: 197.5 kg.   - This will complicate overall  prognosis  Hypotension Blood pressure currently within goal. - Continue home midodrine    Subjective: Patient was seen and examined today.  Now mentation at baseline and he was asking for regular food.  Becoming upset with with small things which is his normal behavior based on prior multiple hospitalizations.  Physical Exam: Vitals:   11/29/23 1100 11/29/23 1114 11/29/23 1300 11/29/23 1457  BP: 103/87  103/85   Pulse: 87  81   Resp:   (!) 0   Temp:  98.4 F (36.9 C)  98.1 F (36.7 C)  TempSrc:    Oral  SpO2: 91%  90%   Weight:      Height:       General.  Morbidly obese gentleman, in no acute distress.  Trach in place. Pulmonary.  Lungs clear bilaterally, normal respiratory effort. CV.  Regular rate and rhythm, no JVD, rub or murmur. Abdomen.  Soft, nontender, nondistended, BS positive. CNS.  Alert and oriented .  No focal neurologic deficit. Extremities.  2+ LE edema,  pulses intact and symmetrical. Psychiatry.  Judgment and insight appears normal.   Data Reviewed: Prior data reviewed  Family Communication: Discussed with patient  Disposition: Status is: Inpatient Remains inpatient appropriate because: Severity of illness  Planned Discharge Destination: Skilled nursing facility  Time spent: 50 minutes  This record has been created using Conservation officer, historic buildings. Errors have been sought and corrected,but may not always be located. Such creation errors do not reflect on the standard of care.   Author: Amaryllis Dare, MD 11/29/2023 3:13 PM  For on call review [email or web address].
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
## 2023-11-30 ENCOUNTER — Encounter: Payer: Self-pay | Admitting: Internal Medicine

## 2023-11-30 DIAGNOSIS — G9341 Metabolic encephalopathy: Secondary | ICD-10-CM | POA: Diagnosis not present

## 2023-11-30 DIAGNOSIS — I5033 Acute on chronic diastolic (congestive) heart failure: Secondary | ICD-10-CM | POA: Diagnosis not present

## 2023-11-30 DIAGNOSIS — J9621 Acute and chronic respiratory failure with hypoxia: Secondary | ICD-10-CM | POA: Diagnosis not present

## 2023-11-30 DIAGNOSIS — J441 Chronic obstructive pulmonary disease with (acute) exacerbation: Secondary | ICD-10-CM | POA: Diagnosis not present

## 2023-11-30 LAB — BASIC METABOLIC PANEL WITH GFR
Anion gap: 9 (ref 5–15)
BUN: 34 mg/dL — ABNORMAL HIGH (ref 8–23)
CO2: 36 mmol/L — ABNORMAL HIGH (ref 22–32)
Calcium: 7.8 mg/dL — ABNORMAL LOW (ref 8.9–10.3)
Chloride: 97 mmol/L — ABNORMAL LOW (ref 98–111)
Creatinine, Ser: 1.63 mg/dL — ABNORMAL HIGH (ref 0.61–1.24)
GFR, Estimated: 46 mL/min — ABNORMAL LOW (ref >60)
Glucose, Bld: 151 mg/dL — ABNORMAL HIGH (ref 70–99)
Potassium: 3.6 mmol/L (ref 3.5–5.1)
Sodium: 142 mmol/L (ref 135–145)

## 2023-11-30 MED ORDER — LEVOFLOXACIN 500 MG PO TABS
750.0000 mg | ORAL_TABLET | Freq: Every day | ORAL | Status: AC
  Administered 2023-11-30 – 2023-12-02 (×3): 750 mg via ORAL
  Filled 2023-11-30 (×3): qty 2

## 2023-11-30 NOTE — Assessment & Plan Note (Signed)
 No wheezing today. -Continue with steroid taper - Continue with bronchodilator and supportive care

## 2023-11-30 NOTE — Assessment & Plan Note (Signed)
 Patient with significant lower extremity edema, BNP normal. - Switching home torsemide  with IV Lasix -will continue -Monitor renal function closely while taking IV Lasix  -Strict intake and output

## 2023-11-30 NOTE — Care Management Important Message (Signed)
 Important Message  Patient Details  Name: Gary [name] MRN: [id number] Date of Birth: 11/30/1955   Important Message Given:  Yes - Medicare IM     Rojelio SHAUNNA Rattler 11/30/2023, 1:05 PM

## 2023-11-30 NOTE — Assessment & Plan Note (Signed)
 Mild AKI with creatinine at 1.71, baseline around 1.2-1.3, some improvement of creatinine and now at 1.63 History of CKD stage III A. - Monitor renal function closely as patient is getting diuresed -Avoid nephrotoxins

## 2023-11-30 NOTE — Plan of Care (Signed)
  Problem: Education: Goal: Knowledge of General Education information will improve Description: Including pain rating scale, medication(s)/side effects and non-pharmacologic comfort measures Outcome: Progressing   Problem: Health Behavior/Discharge Planning: Goal: Ability to manage health-related needs will improve Outcome: Progressing   Problem: Clinical Measurements: Goal: Ability to maintain clinical measurements within normal limits will improve Outcome: Progressing Goal: Will remain free from infection Outcome: Progressing Goal: Diagnostic test results will improve Outcome: Progressing Goal: Respiratory complications will improve Outcome: Progressing Goal: Cardiovascular complication will be avoided Outcome: Progressing   Problem: Activity: Goal: Risk for activity intolerance will decrease Outcome: Progressing   Problem: Nutrition: Goal: Adequate nutrition will be maintained Outcome: Progressing   Problem: Coping: Goal: Level of anxiety will decrease Outcome: Progressing   Problem: Elimination: Goal: Will not experience complications related to bowel motility Outcome: Progressing Goal: Will not experience complications related to urinary retention Outcome: Progressing   Problem: Pain Managment: Goal: General experience of comfort will improve and/or be controlled Outcome: Progressing   Problem: Safety: Goal: Ability to remain free from injury will improve Outcome: Progressing   Problem: Skin Integrity: Goal: Risk for impaired skin integrity will decrease Outcome: Progressing   Problem: Education: Goal: Knowledge of disease or condition will improve Outcome: Progressing Goal: Knowledge of the prescribed therapeutic regimen will improve Outcome: Progressing Goal: Individualized Educational Video(s) Outcome: Progressing   Problem: Activity: Goal: Ability to tolerate increased activity will improve Outcome: Progressing Goal: Will verbalize the  importance of balancing activity with adequate rest periods Outcome: Progressing   Problem: Respiratory: Goal: Ability to maintain a clear airway will improve Outcome: Progressing Goal: Levels of oxygenation will improve Outcome: Progressing Goal: Ability to maintain adequate ventilation will improve Outcome: Progressing

## 2023-11-30 NOTE — Progress Notes (Signed)
 Progress Note   Patient: Gary [name] FMW:[id number] DOB: November 14, 1955 DOA: 11/28/2023     2 DOS: the patient was seen and examined on 11/30/2023   Brief hospital course: Taken from prior notes.   Gary [name] is a 68 y.o. 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 Patient  presents to ED  from  rehab facility in [place]. EMS was called due sat in 70's . Per notes patient uses Bipap but facility does not provide this therapy.   Patient has frequent multiple hospitalization most recent from 10/11/2023-10/26/23 with diagnosis of acute on chronic hypoxic and hypercapnic respiratory failure,sepsis secondary to Pseudomonas  Pneumonia complicated by  Pseudomonas bacteremia in addition to AKI on CKDIIIa.  Of note s/p hospitalization patient was discharged to [facility] and thereafter he was discharged to SNF.   Patient was found to be with acute on chronic hypoxic and hypercapnic respiratory failure and was placed on BiPAP.  Initial chest x-ray concerning for pulmonary vascular congestion so he was given a dose of IV Lasix . CT chest with worsening atelectatic changes in right upper lobe, and no other focal abnormality.  Labs with worsening anemia with hemoglobin at 7.6-anemia panel ordered.  Respiratory viral panel negative, BNP normal at 59.  BUN 35 and creatinine at 1.52 with baseline around 1.2-1.3-not meeting the criteria for AKI.  8/15: Vital stable on BiPAP.  Anemia panel with iron  deficiency, B12 389 and folate of 12.  Ordering 1 dose of IV iron  followed by starting on p.o. supplement.  Procalcitonin at 0.20, creatinine with some improvement to 1.66.  Starting him on twice daily IV Lasix  due to significant lower extremity edema.  Assessment and Plan: * Acute on  chronic respiratory failure with hypoxia and hypercapnia ([name]) Likely secondary to not using BiPAP as recommended.  Patient has a chronic trach and uses 6 L of oxygen  at baseline.  - Improved back to baseline now Mildly elevated procalcitonin so continuing-Levaquin  for 5 days -Continue with supplemental oxygen  -Continue with BiPAP at night and while sleeping -Continue with supportive care  Acute metabolic encephalopathy Likely secondary to significant hypercapnia on presentation, mentation now improved back to baseline after using BiPAP and improvement in hypercapnia. - Continue to monitor  COPD exacerbation ([name]) No wheezing today. -Continue with steroid taper - Continue with bronchodilator and supportive care  Acute on chronic heart failure with preserved ejection fraction (HFpEF) ([name]) Patient with significant lower extremity edema, BNP normal. - Switching home torsemide  with IV Lasix -will continue -Monitor renal function closely while taking IV Lasix  -Strict intake and output  CKD stage 3a, GFR 45-59 ml/min ([name]) Mild AKI with creatinine at 1.71, baseline around 1.2-1.3, some improvement of creatinine and now at 1.63 History of CKD stage III A. - Monitor renal function closely as patient is getting diuresed -Avoid nephrotoxins  Chronic colitis - Continue home mesalamine   Normocytic anemia Hemoglobin at 7.6 with no obvious bleeding.  Anemia panel with anemia of chronic disease, iron  deficiency and B12 of 380 goal above 400. History of prior GI bleed. - Check FOBT -Ordered IV iron  x 1 dose -Starting on p.o. supplement -Monitor hemoglobin -Transfuse if below 7  Sleep apnea - Continue BiPAP at night  Morbid obesity ([name]) Estimated body mass index is 53 kg/m as calculated from  the following:   Height as of this encounter: 6' 4 (1.93 m).   Weight as of this encounter: 197.5 kg.   - This will complicate overall prognosis  Hypotension Blood pressure currently within  goal. - Continue home midodrine    Subjective: Patient was seen and examined today.  Now close to baseline with improving lower extremity edema.  He was very adamant that he will not go back [place] house and need to find a new place.  Physical Exam: Vitals:   11/30/23 0626 11/30/23 0741 11/30/23 1106 11/30/23 1519  BP:  136/77 138/79 127/69  Pulse:  77 70 77  Resp:  19 20 20   Temp:  98 F (36.7 C) 98.9 F (37.2 C) 98.9 F (37.2 C)  TempSrc:      SpO2: 92% 92% 92% 96%  Weight:      Height:       General.  Morbidly obese gentleman, in no acute distress. Pulmonary.  Lungs clear bilaterally, normal respiratory effort. CV.  Regular rate and rhythm, no JVD, rub or murmur. Abdomen.  Soft, nontender, nondistended, BS positive. CNS.  Alert and oriented .  No focal neurologic deficit. Extremities.  2+ LE edema, no cyanosis, pulses intact and symmetrical. Psychiatry.  Judgment and insight appears normal.    Data Reviewed: Prior data reviewed  Family Communication: Discussed with patient  Disposition: Status is: Inpatient Remains inpatient appropriate because: Severity of illness  Planned Discharge Destination: Skilled nursing facility  Time spent: 50 minutes  This record has been created using Conservation officer, historic buildings. Errors have been sought and corrected,but may not always be located. Such creation errors do not reflect on the standard of care.   Author: Amaryllis Dare, MD 11/30/2023 4:24 PM  For on call review [email or web address].

## 2023-11-30 NOTE — Plan of Care (Signed)
   Problem: Education: Goal: Knowledge of General Education information will improve Description: Including pain rating scale, medication(s)/side effects and non-pharmacologic comfort measures Outcome: Progressing   Problem: Clinical Measurements: Goal: Ability to maintain clinical measurements within normal limits will improve Outcome: Progressing Goal: Will remain free from infection Outcome: Progressing Goal: Diagnostic test results will improve Outcome: Progressing Goal: Respiratory complications will improve Outcome: Progressing Goal: Cardiovascular complication will be avoided Outcome: Progressing   Problem: Activity: Goal: Risk for activity intolerance will decrease Outcome: Progressing   Problem: Nutrition: Goal: Adequate nutrition will be maintained Outcome: Progressing

## 2023-11-30 NOTE — Assessment & Plan Note (Signed)
 Likely secondary to not using BiPAP as recommended.  Patient has a chronic trach and uses 6 L of oxygen  at baseline.  - Improved back to baseline now Mildly elevated procalcitonin so continuing-Levaquin  for 5 days -Continue with supplemental oxygen  -Continue with BiPAP at night and while sleeping -Continue with supportive care

## 2023-12-01 DIAGNOSIS — J9622 Acute and chronic respiratory failure with hypercapnia: Secondary | ICD-10-CM | POA: Diagnosis not present

## 2023-12-01 DIAGNOSIS — J9621 Acute and chronic respiratory failure with hypoxia: Secondary | ICD-10-CM | POA: Diagnosis not present

## 2023-12-01 LAB — MAGNESIUM: Magnesium: 1.8 mg/dL (ref 1.7–2.4)

## 2023-12-01 LAB — BASIC METABOLIC PANEL WITH GFR
Anion gap: 9 (ref 5–15)
BUN: 36 mg/dL — ABNORMAL HIGH (ref 8–23)
CO2: 36 mmol/L — ABNORMAL HIGH (ref 22–32)
Calcium: 7.8 mg/dL — ABNORMAL LOW (ref 8.9–10.3)
Chloride: 98 mmol/L (ref 98–111)
Creatinine, Ser: 1.5 mg/dL — ABNORMAL HIGH (ref 0.61–1.24)
GFR, Estimated: 51 mL/min — ABNORMAL LOW (ref >60)
Glucose, Bld: 117 mg/dL — ABNORMAL HIGH (ref 70–99)
Potassium: 3.5 mmol/L (ref 3.5–5.1)
Sodium: 143 mmol/L (ref 135–145)

## 2023-12-01 LAB — PHOSPHORUS: Phosphorus: 2.2 mg/dL — ABNORMAL LOW (ref 2.5–4.6)

## 2023-12-01 MED ORDER — DIAZEPAM 5 MG/ML IJ SOLN
2.5000 mg | Freq: Once | INTRAMUSCULAR | Status: AC
  Administered 2023-12-01: 2.5 mg via INTRAVENOUS
  Filled 2023-12-01: qty 2

## 2023-12-01 MED ORDER — FUROSEMIDE 10 MG/ML IJ SOLN
40.0000 mg | Freq: Once | INTRAMUSCULAR | Status: AC
  Administered 2023-12-01: 40 mg via INTRAVENOUS
  Filled 2023-12-01: qty 4

## 2023-12-01 MED ORDER — HYDROXYZINE HCL 25 MG PO TABS
25.0000 mg | ORAL_TABLET | Freq: Three times a day (TID) | ORAL | Status: DC
  Administered 2023-12-01 – 2023-12-21 (×58): 25 mg via ORAL
  Filled 2023-12-01 (×61): qty 1

## 2023-12-01 MED ORDER — METOLAZONE 5 MG PO TABS
5.0000 mg | ORAL_TABLET | Freq: Once | ORAL | Status: AC
  Administered 2023-12-01: 5 mg via ORAL
  Filled 2023-12-01: qty 1

## 2023-12-01 MED ORDER — FUROSEMIDE 10 MG/ML IJ SOLN
80.0000 mg | Freq: Two times a day (BID) | INTRAMUSCULAR | Status: DC
  Administered 2023-12-01 – 2023-12-04 (×6): 80 mg via INTRAVENOUS
  Filled 2023-12-01 (×7): qty 8

## 2023-12-01 NOTE — Plan of Care (Signed)
   Problem: Education: Goal: Knowledge of General Education information will improve Description: Including pain rating scale, medication(s)/side effects and non-pharmacologic comfort measures Outcome: Progressing   Problem: Clinical Measurements: Goal: Ability to maintain clinical measurements within normal limits will improve Outcome: Progressing Goal: Will remain free from infection Outcome: Progressing Goal: Diagnostic test results will improve Outcome: Progressing Goal: Respiratory complications will improve Outcome: Progressing Goal: Cardiovascular complication will be avoided Outcome: Progressing   Problem: Activity: Goal: Risk for activity intolerance will decrease Outcome: Progressing   Problem: Nutrition: Goal: Adequate nutrition will be maintained Outcome: Progressing   Problem: Elimination: Goal: Will not experience complications related to bowel motility Outcome: Progressing Goal: Will not experience complications related to urinary retention Outcome: Progressing

## 2023-12-01 NOTE — Progress Notes (Signed)
 OT Cancellation Note  Patient Details Name: [name] MRN: [id number] DOB: [birth year]   Cancelled Treatment:    Reason Eval/Treat Not Completed: Medical issues which prohibited therapy. Pt is currently on bipap d/t increased anxiety per nurse. Spoke with pt and he would prefer to stay on bipap and perform therapy evals tomorrow.  Trason Shifflet E Latorie Montesano 12/01/2023, 10:56 AM

## 2023-12-01 NOTE — Progress Notes (Signed)
 PT Cancellation Note  Patient Details Name: Gary [name] MRN: [id number] DOB: [birth year]   Cancelled Treatment:    Reason Eval/Treat Not Completed: Other (comment). Medical issues which prohibited therapy. Pt is currently on bipap d/t increased anxiety per nurse. Spoke with pt and he would prefer to stay on bipap and perform therapy evals tomorrow.    Koray Soter 12/01/2023, 11:11 AM Corean Dade, PT, DPT, GCS [phone]

## 2023-12-01 NOTE — Progress Notes (Signed)
 [facility] Progress Note  Patient: Gary [name]    FMW:[id number]  DOA: 11/28/2023     Date of Service: the patient was seen and examined on 12/01/2023  Chief Complaint  Patient presents with   Shortness of Breath   Brief hospital course: Taken from prior notes   Gary [name] is a 68 y.o. male with medical history significant of heart failure preserved ejection fraction, hx of  DVT s/p treatment , AAA, COPD, hypertension, anemia, chronic hypoxic and hypercapnic respiratory failure on chronic trach (Shiley XLT #7 ) on [place] 5-6L during the day and bipap at bedtime. Patient  presents to ED  from  rehab facility in [place]. EMS was called due sat in 70's . Per notes patient uses Bipap but facility does not provide this therapy.    Patient has frequent multiple hospitalization most recent from 10/11/2023-10/26/23 with diagnosis of acute on chronic hypoxic and hypercapnic respiratory failure,sepsis secondary to Pseudomonas  Pneumonia complicated by  Pseudomonas bacteremia in addition to AKI on CKDIIIa.  Of note s/p hospitalization patient was discharged to [facility] and thereafter he was discharged to SNF.    Patient was found to be with acute on chronic hypoxic and hypercapnic respiratory failure and was placed on BiPAP.  Initial chest x-ray concerning for pulmonary vascular congestion so he was given a dose of IV Lasix . CT chest with worsening atelectatic changes in right upper lobe, and no other focal abnormality.  Labs with worsening anemia with hemoglobin at 7.6-anemia panel ordered.  Respiratory viral panel negative, BNP normal at 59.  BUN 35 and creatinine at 1.52 with baseline around 1.2-1.3-not meeting the criteria for AKI.   Assessment and Plan:   # Acute on chronic respiratory failure with hypoxia and hypercapnia.  Likely secondary to not using BiPAP as recommended.  Patient has a chronic trach and uses 6 L of oxygen  at baseline.  - Improved back to baseline now Mildly elevated  procalcitonin so continuing-Levaquin  for 5 days -Continue with supplemental oxygen  -Continue with BiPAP at night and while sleeping -Continue with supportive care   Acute metabolic encephalopathy: Resolved Likely secondary to significant hypercapnia on presentation, mentation now improved back to baseline after using BiPAP and improvement in hypercapnia. - Continue to monitor   COPD exacerbation:  -Continue with steroid taper - Continue with bronchodilator and supportive care   Acute on chronic heart failure with preserved ejection fraction (HFpEF) Patient with significant lower extremity edema, BNP normal. On Torsemide  at home S/p IV lasix  40 8/17 increased Lasix  80 mg IV twice daily 8/17 Metolazone  5 mg x 1 dose given Monitor renal functions and urine output Fluid restriction 1.5 L/day Significant lower extremity edema, apply Ace wraps and keep legs elevated   CKD stage 3a, GFR 45-59 ml/min ([name]) Mild AKI with creatinine at 1.71, baseline around 1.2-1.3, some improvement of creatinine and now at 1.63 History of CKD stage III A. - Monitor renal function closely as patient is getting diuresed -Avoid nephrotoxins   Chronic colitis - Continue home mesalamine    Normocytic anemia Hemoglobin at 7.6 with no obvious bleeding.  Anemia panel with anemia of chronic disease, iron  deficiency and B12 of 380 goal above 400. History of prior GI bleed. - Check FOBT -s/p IV iron  x 1 dose - Continue oral supplement  -Monitor hemoglobin -Transfuse if below 7   Sleep apnea - Continue BiPAP at night   Morbid obesity ([name]) Estimated body mass index is 53 kg/m as calculated from the following:  Height as of this encounter: 6' 4 (1.93 m).   Weight as of this encounter: 197.5 kg.    - This will complicate overall prognosis   Hypotension Blood pressure currently within goal. - Continue home midodrine   Anxiety, started Atarax   Anemia of chronic disease, monitor H&H.   Body mass  index is 53 kg/m.  Interventions:  Diet: Heart healthy diet, fluid striction 1.5 L/day DVT Prophylaxis: Subcutaneous Heparin     Advance goals of care discussion: Full code  Family Communication: family was not present at bedside, at the time of interview.  The pt provided permission to discuss medical plan with the family. Opportunity was given to ask question and all questions were answered satisfactorily.   Disposition:  Pt is from SNF, admitted with resp failure and volume overload, still has significant edema on IV Lasix , which precludes a safe discharge. Discharge to SNF, when stable, may need few days to improve.  Subjective: No significant events overnight, patient was feeling anxiety which got better after medication and he was wearing BiPAP.  Patient still has significant bilateral lower EXTR edema, he does ambulate with a walker. Patient was complaining of leaking of Foley catheter and discomfort so he wanted to discontinue Foley catheter.   Physical Exam: General: NAD, lying comfortably Appear in no distress, affect appropriate Eyes: PERRLA ENT: Oral Mucosa Clear, moist  Neck: no JVD,  Cardiovascular: S1 and S2 Present, no Murmur,  Respiratory: good respiratory effort, Bilateral Air entry equal and Decreased, no Crackles, no wheezes Abdomen: Bowel Sound present, Soft and no tenderness,  Skin: no rashes Extremities: 4+ pedal edema, no calf tenderness Neurologic: without any new focal findings Gait not checked due to patient safety concerns  Vitals:   12/01/23 0524 12/01/23 0608 12/01/23 0814 12/01/23 1100  BP: 100/85  (!) 144/83   Pulse: 71     Resp: 20     Temp: 98.2 F (36.8 C)  98.4 F (36.9 C) 98.3 F (36.8 C)  TempSrc:   Oral Oral  SpO2: 100% 92% 92%   Weight:      Height:        Intake/Output Summary (Last 24 hours) at 12/01/2023 1601 Last data filed at 12/01/2023 1439 Gross per 24 hour  Intake 840 ml  Output 2080 ml  Net -1240 ml   Filed Weights    11/28/23 1347  Weight: (!) 197.5 kg    Data Reviewed: I have personally reviewed and interpreted daily labs, tele strips, imagings as discussed above. I reviewed all nursing notes, pharmacy notes, vitals, pertinent old records I have discussed plan of care as described above with RN and patient/family.  CBC: Recent Labs  Lab 11/28/23 1352 11/28/23 2058 11/29/23 0938  WBC 6.8 6.7 6.7  NEUTROABS 4.5  --   --   HGB 7.9* 7.6* 7.5*  HCT 31.5* 29.9* 29.1*  MCV 80.2 80.4 79.1*  PLT 200 182 206   Basic Metabolic Panel: Recent Labs  Lab 11/28/23 1500 11/28/23 2058 11/29/23 0938 11/30/23 1001 12/01/23 0747  NA 141  --  142 142 143  K 3.7  --  3.6 3.6 3.5  CL 99  --  99 97* 98  CO2 36*  --  33* 36* 36*  GLUCOSE 89  --  147* 151* 117*  BUN 35*  --  33* 34* 36*  CREATININE 1.52* 1.71* 1.66* 1.63* 1.50*  CALCIUM  7.6*  --  7.9* 7.8* 7.8*  MG  --   --   --   --  1.8  PHOS  --   --   --   --  2.2*    Studies: No results found.  Scheduled Meds:  Chlorhexidine  Gluconate Cloth  6 each Topical Daily   Fe Fum-Vit C-Vit B12-FA  1 capsule Oral BID   fluticasone  furoate-vilanterol  1 puff Inhalation Daily   furosemide   80 mg Intravenous BID   heparin   5,000 Units Subcutaneous Q8H   hydrOXYzine   25 mg Oral TID   ipratropium-albuterol   3 mL Nebulization BID   levofloxacin   750 mg Oral Daily   mesalamine   1.2 g Oral BID   methylPREDNISolone  (SOLU-MEDROL ) injection  40 mg Intravenous Q12H   Followed by   NOREEN ON 12/02/2023] predniSONE   40 mg Oral Q breakfast   metolazone   5 mg Oral Once   midodrine   10 mg Oral TID WC   pantoprazole   40 mg Oral QHS   pregabalin   50 mg Oral Daily   spironolactone   50 mg Oral Daily   Continuous Infusions: PRN Meds: acetaminophen  **OR** acetaminophen , albuterol , ondansetron  **OR** ondansetron  (ZOFRAN ) IV  Time spent: 55 minutes  Author: ELVAN SOR. MD [facility] 12/01/2023 4:01 PM  To reach On-call, see care teams to locate the  attending and reach out to them via [email or web address]. If 7PM-7AM, please contact night-coverage If you still have difficulty reaching the attending provider, please page the [facility] (Director on Call) for [facility] on amion for assistance.

## 2023-12-02 DIAGNOSIS — J9621 Acute and chronic respiratory failure with hypoxia: Secondary | ICD-10-CM | POA: Diagnosis not present

## 2023-12-02 DIAGNOSIS — J9622 Acute and chronic respiratory failure with hypercapnia: Secondary | ICD-10-CM | POA: Diagnosis not present

## 2023-12-02 LAB — CBC
HCT: 32.7 % — ABNORMAL LOW (ref 39.0–52.0)
Hemoglobin: 8.2 g/dL — ABNORMAL LOW (ref 13.0–17.0)
MCH: 20.1 pg — ABNORMAL LOW (ref 26.0–34.0)
MCHC: 25.1 g/dL — ABNORMAL LOW (ref 30.0–36.0)
MCV: 80.1 fL (ref 80.0–100.0)
Platelets: 210 K/uL (ref 150–400)
RBC: 4.08 MIL/uL — ABNORMAL LOW (ref 4.22–5.81)
RDW: 22.4 % — ABNORMAL HIGH (ref 11.5–15.5)
WBC: 7.9 K/uL (ref 4.0–10.5)
nRBC: 1.4 % — ABNORMAL HIGH (ref 0.0–0.2)

## 2023-12-02 LAB — BASIC METABOLIC PANEL WITH GFR
Anion gap: 11 (ref 5–15)
BUN: 41 mg/dL — ABNORMAL HIGH (ref 8–23)
CO2: 38 mmol/L — ABNORMAL HIGH (ref 22–32)
Calcium: 7.9 mg/dL — ABNORMAL LOW (ref 8.9–10.3)
Chloride: 95 mmol/L — ABNORMAL LOW (ref 98–111)
Creatinine, Ser: 1.62 mg/dL — ABNORMAL HIGH (ref 0.61–1.24)
GFR, Estimated: 47 mL/min — ABNORMAL LOW (ref >60)
Glucose, Bld: 99 mg/dL (ref 70–99)
Potassium: 3.9 mmol/L (ref 3.5–5.1)
Sodium: 144 mmol/L (ref 135–145)

## 2023-12-02 LAB — VITAMIN D 25 HYDROXY (VIT D DEFICIENCY, FRACTURES): Vit D, 25-Hydroxy: 32.23 ng/mL (ref 30–100)

## 2023-12-02 LAB — PHOSPHORUS: Phosphorus: 3.6 mg/dL (ref 2.5–4.6)

## 2023-12-02 LAB — MAGNESIUM: Magnesium: 1.9 mg/dL (ref 1.7–2.4)

## 2023-12-02 MED ORDER — POLYVINYL ALCOHOL 1.4 % OP SOLN: 1.0000 [drp] | OPHTHALMIC | Status: DC | PRN

## 2023-12-02 MED ORDER — ESCITALOPRAM OXALATE 20 MG PO TABS
20.0000 mg | ORAL_TABLET | Freq: Every day | ORAL | Status: DC
  Administered 2023-12-02 – 2023-12-21 (×20): 20 mg via ORAL
  Filled 2023-12-02 (×20): qty 2

## 2023-12-02 MED ORDER — TRAZODONE HCL 50 MG PO TABS
50.0000 mg | ORAL_TABLET | Freq: Every evening | ORAL | Status: DC | PRN
  Administered 2023-12-02 – 2023-12-12 (×4): 50 mg via ORAL
  Filled 2023-12-02 (×4): qty 1

## 2023-12-02 MED ORDER — OXYCODONE HCL 5 MG PO TABS
5.0000 mg | ORAL_TABLET | Freq: Two times a day (BID) | ORAL | Status: DC | PRN
  Administered 2023-12-02 – 2023-12-21 (×12): 5 mg via ORAL
  Filled 2023-12-02 (×12): qty 1

## 2023-12-02 MED ORDER — GUAIFENESIN ER 600 MG PO TB12
600.0000 mg | ORAL_TABLET | Freq: Two times a day (BID) | ORAL | Status: DC
  Administered 2023-12-02 – 2023-12-21 (×39): 600 mg via ORAL
  Filled 2023-12-02 (×39): qty 1

## 2023-12-02 NOTE — Progress Notes (Signed)
 Pt also refused to have his legs wrapped per drs orders. I also reminded him of fluid restriction this morning.

## 2023-12-02 NOTE — NC FL2 (Signed)
 [place]  MEDICAID FL2 LEVEL OF CARE FORM     IDENTIFICATION  Patient Name: Gary [name] Birthdate: Aug 21, 1955 Sex: male Admission Date (Current Location): 11/28/2023  [facility] and [place] Number:  Chiropodist and Address:  [facility], [address] [place], [place] [address]      Provider Number: [id number]  Attending Physician Name and Address:  Von Bellis, MD  Relative Name and Phone Number:       Current Level of Care: Hospital Recommended Level of Care: Skilled Nursing Facility Prior Approval Number:    Date Approved/Denied:   PASRR Number: [id number]  Discharge Plan: SNF    Current Diagnoses: Patient Active Problem List   Diagnosis Date Noted   Normocytic anemia 11/29/2023   CKD stage 3a, GFR 45-59 ml/min ([name])    COPD exacerbation ([name]) 11/28/2023   Bacteremia due to Pseudomonas 10/26/2023   Pseudomonas pneumonia ([name]) 10/26/2023   Acute hypercapnic respiratory failure ([name]) 10/15/2023   CHF (congestive heart failure) ([name]) 06/07/2023   Diarrhea 05/17/2023   Rectal bleeding 05/16/2023   Tracheitis 02/26/2023   Dry skin 01/25/2023   Eyelid cyst, right 01/23/2023   Fluid overload 11/30/2022   Acute on chronic respiratory failure ([name]) 11/02/2022   History of DVT (deep vein thrombosis) 10/27/2022   Acute kidney injury superimposed on CKD ([name]) 10/27/2022   Acute on chronic respiratory failure with hypoxia ([name]) 10/17/2022   Acute hypoxemic respiratory failure ([name]) 09/26/2022   Acute metabolic encephalopathy 09/26/2022   Chronic colitis 09/12/2022   Segmental colitis associated with diverticulosis ([name]) 09/12/2022   Diverticulosis of colon with hemorrhage 09/10/2022   Acute blood loss anemia 09/08/2022   Acute GI bleeding 09/07/2022   Chest pain 08/13/2022   Acute on chronic heart failure with preserved ejection fraction (HFpEF) ([name]) 08/12/2022   Influenza A 04/11/2022   Hypokalemia 03/06/2022   Chronic  obstructive pulmonary disease (COPD) ([name]) 02/03/2022   GERD without esophagitis 02/03/2022   Anxiety 02/03/2022   Melena    Major depressive disorder, recurrent episode, moderate ([name]) 10/22/2021   Pressure injury of skin 09/27/2021   COPD (chronic obstructive pulmonary disease) ([name])    Iron  deficiency anemia    Generalized weakness    CAP (community acquired pneumonia) due to Pneumococcus ([name]) 03/11/2021   (HFpEF) heart failure with preserved ejection fraction ([name])    Hyperlipidemia 02/19/2021   Generalized osteoarthritis of multiple sites 10/31/2020   Hyperkalemia 03/04/2020   Marijuana use 01/17/2020   Thrombocytopenia ([name]) 01/17/2020   Positive hepatitis C antibody test 01/17/2020   Osteoarthritis of glenohumeral joints (Bilateral) 01/17/2020   Osteoarthritis of AC (acromioclavicular) joints (Bilateral) 01/17/2020   Polysubstance abuse ([name]) 08/25/2019   Toxic metabolic encephalopathy 08/25/2019   Chronic pain syndrome 07/14/2019   Anticoagulated 07/14/2019   DDD (degenerative disc disease), lumbosacral 07/14/2019   DDD (degenerative disc disease), cervical 07/14/2019   Acute on chronic respiratory failure with hypoxia and hypercapnia ([name]) 05/28/2018   Acute on chronic diastolic CHF (congestive heart failure) ([name]) 02/28/2015   Sleep apnea 02/28/2015   Morbid obesity ([name]) 04/01/2014   Hypotension 04/01/2014    Orientation RESPIRATION BLADDER Height & Weight     Self, Time, Situation, Place  Tracheostomy, O2, Other (Comment) (Nasal Cannula 6 L. Bipap QHS: 20/5 at 60%. Trach Shiley XLT Distal 7mm uncuffed. Jamal is currently capped.) Continent, External catheter Weight: (!) 435 lb 6.5 oz (197.5 kg) Height:  6' 4 (193 cm)  BEHAVIORAL SYMPTOMS/MOOD NEUROLOGICAL BOWEL NUTRITION STATUS   (None)  (  None) Incontinent Diet (Heart healthy. Fluid restriction: 1500 mL.)  AMBULATORY STATUS COMMUNICATION OF NEEDS Skin   Limited Assist Verbally Normal                        Personal Care Assistance Level of Assistance  Bathing, Feeding, Dressing Bathing Assistance: Maximum assistance Feeding assistance: Limited assistance Dressing Assistance: Maximum assistance     Functional Limitations Info  Hearing, Sight, Speech Sight Info: Adequate Hearing Info: Adequate Speech Info: Adequate    SPECIAL CARE FACTORS FREQUENCY  PT (By licensed PT), OT (By licensed OT)     PT Frequency: 5 x week OT Frequency: 5 x week            Contractures Contractures Info: Not present    Additional Factors Info  Code Status, Allergies Code Status Info: Full code Allergies Info: NKDA           Current Medications (12/02/2023):  This is the current hospital active medication list Current Facility-Administered Medications  Medication Dose Route Frequency Provider Last Rate Last Admin   acetaminophen  (TYLENOL ) tablet 650 mg  650 mg Oral Q6H PRN Thomas, Sara-Maiz A, MD   650 mg at 12/01/23 2121   Or   acetaminophen  (TYLENOL ) suppository 650 mg  650 mg Rectal Q6H PRN Thomas, Sara-Maiz A, MD       albuterol  (PROVENTIL ) (2.5 MG/3ML) 0.083% nebulizer solution 2.5 mg  2.5 mg Nebulization Q2H PRN Thomas, Sara-Maiz A, MD       artificial tears ophthalmic solution 1 drop  1 drop Both Eyes PRN Von Bellis, MD       escitalopram  (LEXAPRO ) tablet 20 mg  20 mg Oral Daily Von Bellis, MD   20 mg at 12/02/23 0851   Fe Fum-Vit C-Vit B12-FA (TRIGELS-F FORTE) capsule 1 capsule  1 capsule Oral BID Amin, Sumayya, MD   1 capsule at 12/02/23 0829   fluticasone  furoate-vilanterol (BREO ELLIPTA ) 100-25 MCG/ACT 1 puff  1 puff Inhalation Daily Debby Hitch A, MD   1 puff at 12/02/23 0830   furosemide  (LASIX ) injection 80 mg  80 mg Intravenous BID Von Bellis, MD   80 mg at 12/02/23 1016   heparin  injection 5,000 Units  5,000 Units Subcutaneous Q8H Debby Hitch A, MD   5,000 Units at 12/01/23 2122   hydrOXYzine  (ATARAX ) tablet 25 mg  25 mg Oral TID Von Bellis, MD   25 mg  at 12/02/23 9171   ipratropium-albuterol  (DUONEB) 0.5-2.5 (3) MG/3ML nebulizer solution 3 mL  3 mL Nebulization BID Amin, Sumayya, MD   3 mL at 12/02/23 0757   levofloxacin  (LEVAQUIN ) tablet 750 mg  750 mg Oral Daily Amin, Sumayya, MD   750 mg at 12/01/23 2121   mesalamine  (LIALDA ) EC tablet 1.2 g  1.2 g Oral BID Thomas, Sara-Maiz A, MD   1.2 g at 12/02/23 9171   midodrine  (PROAMATINE ) tablet 10 mg  10 mg Oral TID WC Aleskerov, Fuad, MD   10 mg at 12/02/23 0826   ondansetron  (ZOFRAN ) tablet 4 mg  4 mg Oral Q6H PRN Debby Hitch LABOR, MD       Or   ondansetron  (ZOFRAN ) injection 4 mg  4 mg Intravenous Q6H PRN Debby Hitch LABOR, MD       oxyCODONE  (Oxy IR/ROXICODONE ) immediate release tablet 5 mg  5 mg Oral BID PRN Von Bellis, MD   5 mg at 12/02/23 0851   pantoprazole  (PROTONIX ) EC tablet 40 mg  40 mg Oral QHS  Debby Camila LABOR, MD   40 mg at 12/01/23 2121   predniSONE  (DELTASONE ) tablet 40 mg  40 mg Oral Q breakfast Thomas, Sara-Maiz A, MD   40 mg at 12/02/23 9173   pregabalin  (LYRICA ) capsule 50 mg  50 mg Oral Daily Thomas, Sara-Maiz A, MD   50 mg at 12/02/23 0827   spironolactone  (ALDACTONE ) tablet 50 mg  50 mg Oral Daily Aleskerov, Fuad, MD   50 mg at 12/02/23 0827   traZODone  (DESYREL ) tablet 50 mg  50 mg Oral QHS PRN Von Bellis, MD         Discharge Medications: Please see discharge summary for a list of discharge medications.  Relevant Imaging Results:  Relevant Lab Results:   Additional Information SS#: [id number]. Has had trach since 2023.  [name], LCSW

## 2023-12-02 NOTE — Evaluation (Signed)
 Occupational Therapy Evaluation Patient Details Name: Gary [name] MRN: [id number] DOB: 1955/10/28 Today's Date: 12/02/2023   History of Present Illness   Pt is a 68 y.o. male with medical history significant of HFpEF, DVT, AAA, COPD, HTN, anemia, chronic hypoxic and hypercapnic respiratory failure on chronic trach on [place] 5-6L during the day and bipap at bedtime. Patient  presents to ED  from rehab facility in [place]. EMS was called due sat in 70's . Per notes patient uses Bipap but facility does not provide this therapy.  Patient was placed on NRB in the field with increase in sat to 89-90 %. Of note patient has interim history of hospitalization form 10/11/2023-10/26/23 with diagnosis of acute on chronic hypoxic and hypercapnic respiratory failure, sepsis secondary to Pseudomonas. MD assessment includes: acute on chronic respiratory failure with hypoxia and hypercapnia, acute metabolic encephalopathy, COPD exacerbation, acute on chronic heart failure with preserved ejection fraction, hypotension, and normocytic anemia.     Clinical Impressions Patient presenting with decreased Ind in self care,balance, functional mobility/transfers, endurance, and safety awareness. Patient reports transferring to another facility but being there for less than an hour before coming to hospital secondary to SOB and pt reporting facility did not have needed Bipap. Pt on bed pan and needing min A to roll and total A for hygiene. Pt comes to EOB with minimal assist but requests bed be substantially elevated before being willing to attempt to stand. Pt stands and therapist adjusts bed mattress for comfort. Pt returning to supine with assistance for B LEs. Call bell and all needed items within reach. Pt reports not being far from his baseline at this time. Patient will benefit from acute OT to increase overall independence in the areas of ADLs, functional mobility, and safety awareness in order to safely discharge.     If  plan is discharge home, recommend the following:   A lot of help with walking and/or transfers;A lot of help with bathing/dressing/bathroom;Assistance with cooking/housework;Assistance with feeding;Assist for transportation;Help with stairs or ramp for entrance     Functional Status Assessment   Patient has had a recent decline in their functional status and demonstrates the ability to make significant improvements in function in a reasonable and predictable amount of time.     Equipment Recommendations   Other (comment) (defer to next venue of care)      Precautions/Restrictions   Precautions Precautions: Fall     Mobility Bed Mobility Overal bed mobility: Needs Assistance Bed Mobility: Rolling, Supine to Sit, Sit to Supine Rolling: Min assist, Used rails   Supine to sit: Supervision, Used rails, HOB elevated Sit to supine: Min assist, Used rails        Transfers Overall transfer level: Needs assistance Equipment used: Rolling walker (2 wheels) Transfers: Sit to/from Stand Sit to Stand: Contact guard assist, From elevated surface           General transfer comment: Pt demanded that the EOB to be significantly elevated before even attempting to come to standing; was able to come to standing with min extra effort but no physical assistance needed      Balance Overall balance assessment: Needs assistance   Sitting balance-[name] Scale: Good     Standing balance support: Bilateral upper extremity supported, During functional activity, Reliant on assistive device for balance Standing balance-[name] Scale: Fair  ADL either performed or assessed with clinical judgement   ADL Overall ADL's : Needs assistance/impaired                         Toilet Transfer: Maximal assistance;+2 for physical assistance                   Vision Patient Visual Report: No change from baseline              Pertinent  Vitals/Pain Pain Assessment Pain Assessment: 0-10 Pain Score: 8  Pain Location: General body aches and pains, chronic per patient Pain Descriptors / Indicators: Sore Pain Intervention(s): Limited activity within patient's tolerance, Monitored during session, Repositioned     Extremity/Trunk Assessment Upper Extremity Assessment Upper Extremity Assessment: Generalized weakness   Lower Extremity Assessment Lower Extremity Assessment: Generalized weakness       Communication Communication Communication: No apparent difficulties   Cognition Arousal: Alert Behavior During Therapy: WFL for tasks assessed/performed Cognition: No apparent impairments                               Following commands: Intact       Cueing  General Comments   Cueing Techniques: Verbal cues              Home Living Family/patient expects to be discharged to:: Assisted living                             Home Equipment: Wheelchair - Nurse, children's (2 wheels);Hospital bed;Other (comment)   Additional Comments: Pt was living at [facility] but stated that he can not return there secondary to them not being able to meet his medical needs      Prior Functioning/Environment Prior Level of Function : Needs assist             Mobility Comments: Pt able to perform bed mobility tasks and sit to/from stand transfers from elevated surfaces without physical assist, limited amb in his room only with a BRW ADLs Comments: Staff assists with LB ADLs and toileting (bed pan) per pt report.    OT Problem List: Decreased strength;Impaired balance (sitting and/or standing);Pain;Decreased safety awareness;Decreased activity tolerance   OT Treatment/Interventions: Self-care/ADL training;Therapeutic exercise;Patient/family education;Balance training;Energy conservation;Therapeutic activities      OT Goals(Current goals can be found in the care plan section)    Acute Rehab OT Goals Patient Stated Goal: to go home OT Goal Formulation: With patient Time For Goal Achievement: 12/30/23 Potential to Achieve Goals: Fair ADL Goals Pt Will Perform Grooming: standing;with modified independence Pt Will Perform Lower Body Dressing: with contact guard assist;sit to/from stand;with adaptive equipment Pt Will Transfer to Toilet: with contact guard assist;bedside commode;ambulating Pt Will Perform Toileting - Clothing Manipulation and hygiene: with contact guard assist;sit to/from stand   OT Frequency:  Min 2X/week    Co-evaluation PT/OT/SLP Co-Evaluation/Treatment: Yes Reason for Co-Treatment: For patient/therapist safety;To address functional/ADL transfers PT goals addressed during session: Mobility/safety with mobility;Proper use of DME;Strengthening/ROM OT goals addressed during session: ADL's and self-care      AM-PAC OT 6 Clicks Daily Activity     Outcome Measure Help from another person eating meals?: None Help from another person taking care of personal grooming?: A Little Help from another person toileting, which includes using toliet, bedpan, or urinal?: A Little Help from another person bathing (  including washing, rinsing, drying)?: A Lot Help from another person to put on and taking off regular upper body clothing?: A Lot Help from another person to put on and taking off regular lower body clothing?: Total 6 Click Score: 15   End of Session Equipment Utilized During Treatment: Rolling walker (2 wheels) Nurse Communication: Mobility status  Activity Tolerance: Patient tolerated treatment well Patient left: in bed;with call bell/phone within reach;with bed alarm set  OT Visit Diagnosis: Unsteadiness on feet (R26.81);Muscle weakness (generalized) (M62.81)                Time: 0912-0950 OT Time Calculation (min): 38 min Charges:  OT General Charges $OT Visit: 1 Visit OT Evaluation $OT Eval Moderate Complexity: 1 Mod OT  Treatments $Self Care/Home Management : 8-22 mins  Izetta Claude, MS, OTR/L , CBIS ascom [phone]  12/02/23, 4:19 PM

## 2023-12-02 NOTE — Evaluation (Signed)
 Physical Therapy Evaluation Patient Details Name: Gary [name] MRN: [id number] DOB: Aug 15, 1955 Today's Date: 12/02/2023  History of Present Illness  Pt is a 68 y.o. male with medical history significant of HFpEF, DVT, AAA, COPD, HTN, anemia, chronic hypoxic and hypercapnic respiratory failure on chronic trach on [place] 5-6L during the day and bipap at bedtime. Patient  presents to ED  from rehab facility in [place]. EMS was called due sat in 70's . Per notes patient uses Bipap but facility does not provide this therapy.  Patient was placed on NRB in the field with increase in sat to 89-90 %. Of note patient has interim history of hospitalization form 10/11/2023-10/26/23 with diagnosis of acute on chronic hypoxic and hypercapnic respiratory failure, sepsis secondary to Pseudomonas. MD assessment includes: acute on chronic respiratory failure with hypoxia and hypercapnia, acute metabolic encephalopathy, COPD exacerbation, acute on chronic heart failure with preserved ejection fraction, hypotension, and normocytic anemia.   Clinical Impression  Pt was pleasant and motivated to participate during the session and put forth good effort throughout. Pt required physical assistance with some bed mobility tasks per below and as is typical for this patient demanded that the EOB be significantly elevated before attempting to come to standing.  With bed in highly elevated position the pt was able to come to standing without the use of his UEs to assist and with no overt LOB once in standing with a BRW.  Pt was able to take several small steps at the EOB and to remain in standing x around 90 sec before fatiguing and needing to return to sitting.  Pt will benefit from continued PT services upon discharge to safely address deficits listed in patient problem list for decreased caregiver assistance and eventual return to PLOF.          If plan is discharge home, recommend the following: A lot of help with walking and/or  transfers;A lot of help with bathing/dressing/bathroom;Assistance with cooking/housework;Direct supervision/assist for medications management;Assist for transportation;Help with stairs or ramp for entrance;Direct supervision/assist for financial management   Can travel by private vehicle   No    Equipment Recommendations Other (comment) (TBD at next venue of care)  Recommendations for Other Services       Functional Status Assessment Patient has had a recent decline in their functional status and demonstrates the ability to make significant improvements in function in a reasonable and predictable amount of time.     Precautions / Restrictions Precautions Precautions: Fall Restrictions Weight Bearing Restrictions Per Provider Order: No      Mobility  Bed Mobility Overal bed mobility: Needs Assistance Bed Mobility: Rolling, Supine to Sit, Sit to Supine Rolling: Min assist, Used rails   Supine to sit: Supervision, Used rails, HOB elevated Sit to supine: Min assist, Used rails   General bed mobility comments: Min A for BLE control/positioning during sit to sup    Transfers Overall transfer level: Needs assistance Equipment used: Rolling walker (2 wheels) (BRW) Transfers: Sit to/from Stand Sit to Stand: Contact guard assist, From elevated surface           General transfer comment: Pt demanded that the EOB to be significantly elevated before even attempting to come to standing; was able to come to standing with min extra effort but no physical assistance needed    Ambulation/Gait Ambulation/Gait assistance: Contact guard assist   Assistive device: Rolling walker (2 wheels) (BRW) Gait Pattern/deviations: Step-to pattern, Decreased step length - right, Decreased step length - left  Gait velocity: decreased     General Gait Details: Pt able to take several very small steps at the EOB before fatiguing and needing to return to sitting  Stairs            Wheelchair  Mobility     Tilt Bed    Modified Rankin (Stroke Patients Only)       Balance Overall balance assessment: Needs assistance   Sitting balance-[name] Scale: Good     Standing balance support: Bilateral upper extremity supported, During functional activity, Reliant on assistive device for balance Standing balance-[name] Scale: Fair                               Pertinent Vitals/Pain Pain Assessment Pain Assessment: 0-10 Pain Score: 8  Pain Location: General body aches and pains, chronic per patient Pain Descriptors / Indicators: Sore Pain Intervention(s): Premedicated before session, Monitored during session, Repositioned    Home Living Family/patient expects to be discharged to:: Assisted living                 Home Equipment: Wheelchair - Nurse, children's (2 wheels);Hospital bed;Other (comment) (BRW) Additional Comments: Pt was living at [facility] but stated that he can not return there secondary to them not being able to meet his medical needs    Prior Function               Mobility Comments: Pt able to perform bed mobility tasks and sit to/from stand transfers from elevated surfaces without physical assist, limited amb in his room only with a BRW ADLs Comments: Staff assists with ADLs     Extremity/Trunk Assessment   Upper Extremity Assessment Upper Extremity Assessment: Defer to OT evaluation    Lower Extremity Assessment Lower Extremity Assessment: Generalized weakness       Communication   Communication Communication: No apparent difficulties    Cognition Arousal: Alert Behavior During Therapy: WFL for tasks assessed/performed   PT - Cognitive impairments: No apparent impairments                         Following commands: Intact       Cueing Cueing Techniques: Verbal cues     General Comments      Exercises Other Exercises Other Exercises: Static standing at the EOB for improved  activity tolerance x 90 sec   Assessment/Plan    PT Assessment Patient needs continued PT services  PT Problem List Decreased strength;Decreased activity tolerance;Decreased balance;Decreased mobility;Decreased knowledge of use of DME;Pain       PT Treatment Interventions DME instruction;Gait training;Functional mobility training;Therapeutic activities;Therapeutic exercise;Balance training;Patient/family education    PT Goals (Current goals can be found in the Care Plan section)  Acute Rehab PT Goals Patient Stated Goal: To get stronger and walk better PT Goal Formulation: With patient Time For Goal Achievement: 12/15/23 Potential to Achieve Goals: Fair    Frequency Min 2X/week     Co-evaluation PT/OT/SLP Co-Evaluation/Treatment: Yes Reason for Co-Treatment: For patient/therapist safety;To address functional/ADL transfers PT goals addressed during session: Mobility/safety with mobility;Proper use of DME;Strengthening/ROM         AM-PAC PT 6 Clicks Mobility  Outcome Measure Help needed turning from your back to your side while in a flat bed without using bedrails?: A Little Help needed moving from lying on your back to sitting on the side of a flat bed without using bedrails?: A  Little Help needed moving to and from a bed to a chair (including a wheelchair)?: A Little Help needed standing up from a chair using your arms (e.g., wheelchair or bedside chair)?: A Little Help needed to walk in hospital room?: A Lot Help needed climbing 3-5 steps with a railing? : Total 6 Click Score: 15    End of Session Equipment Utilized During Treatment: Gait belt;Oxygen  Activity Tolerance: Patient tolerated treatment well Patient left: in bed;with call bell/phone within reach;Other (comment) (No bed alarm on this bariatric bed) Nurse Communication: Mobility status PT Visit Diagnosis: Difficulty in walking, not elsewhere classified (R26.2);Muscle weakness (generalized) (M62.81);Pain Pain  - Right/Left:  (bilateral) Pain - part of body: Leg;Hip    Time: 0906-0950 PT Time Calculation (min) (ACUTE ONLY): 44 min   Charges:   PT Evaluation $PT Eval Moderate Complexity: 1 Mod PT Treatments $Therapeutic Activity: 8-22 mins PT General Charges $$ ACUTE PT VISIT: 1 Visit    D. Scott Daran Favaro PT, DPT 12/02/23, 12:05 PM

## 2023-12-02 NOTE — Progress Notes (Signed)
 Pt called this nurse to room. Pt stated malewick was leaking. Noted that patient had been incontinent of stool as well. Obtained things to change bed and get patient situated. Charge nurse in room to assist this nurse in patient care. Pt was rolled to right side and cleaned and when patient went to left to be cleaned after patient was wiped with wash cloth patient started yelling and accusing nurse that she did not wipe enough and that he was not cleaned. Pt continued to get even more verbally aggressive with nurses. Cursing and calling us  names. This nurse attempted to calm patient down and to replace his BIPAP mask as his oxgyen saturations were dropping. Patients requests were met and he was wiped again even thought he was already clean to try to appease him. Replaced linens and gown and patient did calm down. Pt will not use the call light and insists on yelling very loudly for a nurse to help him and growing very aggressive and angry when he is not helped immediately.

## 2023-12-02 NOTE — TOC Initial Note (Signed)
 Transition of Care [facility]) - Initial/Assessment Note    Patient Details  Name: Gary [name] MRN: [id number] Date of Birth: January 23, 1956  Transition of Care [facility]) CM/SW Contact:    [name], LCSW Phone Number: 12/02/2023, 12:28 PM  Clinical Narrative:  CSW met with patient. No family at bedside. CSW introduced role and explained that discharge planning would be discussed. Patient confirmed he admitted from [facility] but said he was not going to return. He stated this was facility was more like a boarding house/crack house. CSW explained that it is a medical facility. Patient wants to see if any other facilities can take him. CSW extended referral. [facility] said patient had only been at their facility for about an hour before he had to come back to the hospital. They confirmed they did have a bipap machine for him at the facility. No further concerns. CSW will continue to follow patient for support and facilitate discharge once medically stable.                Expected Discharge Plan: Skilled Nursing Facility Barriers to Discharge: Continued Medical Work up   Patient Goals and CMS Choice            Expected Discharge Plan and Services     Post Acute Care Choice: Skilled Nursing Facility Living arrangements for the past 2 months: Skilled Nursing Facility [facility])                                      Prior Living Arrangements/Services Living arrangements for the past 2 months: Skilled Nursing Facility [facility])   Patient language and need for interpreter reviewed:: Yes Do you feel safe going back to the place where you live?: Yes      Need for Family Participation in Patient Care: Yes (Comment)     Criminal Activity/Legal Involvement Pertinent to Current Situation/Hospitalization: No - Comment as needed  Activities of Daily Living   ADL Screening (condition at time of admission) Independently performs ADLs?: No Does the patient have a NEW  difficulty with bathing/dressing/toileting/self-feeding that is expected to last >3 days?: No Does the patient have a NEW difficulty with getting in/out of bed, walking, or climbing stairs that is expected to last >3 days?: No Does the patient have a NEW difficulty with communication that is expected to last >3 days?: No Is the patient deaf or have difficulty hearing?: No Does the patient have difficulty seeing, even when wearing glasses/contacts?: No Does the patient have difficulty concentrating, remembering, or making decisions?: No  Permission Sought/Granted Permission sought to share information with : Facility Industrial/product designer granted to share information with : Yes, Verbal Permission Granted     Permission granted to share info w AGENCY: SNF's        Emotional Assessment Appearance:: Appears stated age Attitude/Demeanor/Rapport: Engaged   Orientation: : Oriented to Self, Oriented to Place, Oriented to  Time, Oriented to Situation Alcohol  / Substance Use: Not Applicable Psych Involvement: No (comment)  Admission diagnosis:  COPD exacerbation ([name]) [J44.1] Acute on chronic hypoxic respiratory failure ([name]) [J96.21] Patient Active Problem List   Diagnosis Date Noted   Normocytic anemia 11/29/2023   CKD stage 3a, GFR 45-59 ml/min ([name])    COPD exacerbation ([name]) 11/28/2023   Bacteremia due to Pseudomonas 10/26/2023   Pseudomonas pneumonia ([name]) 10/26/2023   Acute hypercapnic respiratory failure ([name]) 10/15/2023  CHF (congestive heart failure) ([name]) 06/07/2023   Diarrhea 05/17/2023   Rectal bleeding 05/16/2023   Tracheitis 02/26/2023   Dry skin 01/25/2023   Eyelid cyst, right 01/23/2023   Fluid overload 11/30/2022   Acute on chronic respiratory failure ([name]) 11/02/2022   History of DVT (deep vein thrombosis) 10/27/2022   Acute kidney injury superimposed on CKD ([name]) 10/27/2022   Acute on chronic respiratory failure with hypoxia ([name]) 10/17/2022   Acute  hypoxemic respiratory failure ([name]) 09/26/2022   Acute metabolic encephalopathy 09/26/2022   Chronic colitis 09/12/2022   Segmental colitis associated with diverticulosis ([name]) 09/12/2022   Diverticulosis of colon with hemorrhage 09/10/2022   Acute blood loss anemia 09/08/2022   Acute GI bleeding 09/07/2022   Chest pain 08/13/2022   Acute on chronic heart failure with preserved ejection fraction (HFpEF) ([name]) 08/12/2022   Influenza A 04/11/2022   Hypokalemia 03/06/2022   Chronic obstructive pulmonary disease (COPD) ([name]) 02/03/2022   GERD without esophagitis 02/03/2022   Anxiety 02/03/2022   Melena    Major depressive disorder, recurrent episode, moderate ([name]) 10/22/2021   Pressure injury of skin 09/27/2021   COPD (chronic obstructive pulmonary disease) ([name])    Iron  deficiency anemia    Generalized weakness    CAP (community acquired pneumonia) due to Pneumococcus ([name]) 03/11/2021   (HFpEF) heart failure with preserved ejection fraction ([name])    Hyperlipidemia 02/19/2021   Generalized osteoarthritis of multiple sites 10/31/2020   Hyperkalemia 03/04/2020   Marijuana use 01/17/2020   Thrombocytopenia ([name]) 01/17/2020   Positive hepatitis C antibody test 01/17/2020   Osteoarthritis of glenohumeral joints (Bilateral) 01/17/2020   Osteoarthritis of AC (acromioclavicular) joints (Bilateral) 01/17/2020   Polysubstance abuse ([name]) 08/25/2019   Toxic metabolic encephalopathy 08/25/2019   Chronic pain syndrome 07/14/2019   Anticoagulated 07/14/2019   DDD (degenerative disc disease), lumbosacral 07/14/2019   DDD (degenerative disc disease), cervical 07/14/2019   Acute on chronic respiratory failure with hypoxia and hypercapnia ([name]) 05/28/2018   Acute on chronic diastolic CHF (congestive heart failure) ([name]) 02/28/2015   Sleep apnea 02/28/2015   Morbid obesity ([name]) 04/01/2014   Hypotension 04/01/2014   PCP:  Caleen Dirks, MD Pharmacy:   [facility] - [place], [place] - [address] [place] [address] Phone: [phone] Fax: [phone]  [facility] [address] [place] [address] Phone: [phone] Fax: [phone]  [facility] [address] ([address]), [place] - [address] [place] ([place]) [place] [address] Phone: [phone] Fax: [phone]  [facility] - [place], [place] - [address] [place] [address] Phone: [phone] Fax: [phone]     Social Drivers of Health (SDOH) Social History: SDOH Screenings   Food Insecurity: No Food Insecurity (11/30/2023)  Housing: High Risk (11/30/2023)  Transportation Needs: No Transportation Needs (11/30/2023)  Utilities: Not At Risk (11/30/2023)  Depression (PHQ2-9): Low Risk  (06/07/2021)  Financial Resource Strain: High Risk (11/21/2022)   Received from [facility]  Physical Activity: Insufficiently Active (05/03/2017)  Social Connections: Moderately Isolated (11/30/2023)  Stress: Stress Concern Present (11/20/2022)   Received from [facility]  Tobacco Use: Medium Risk (11/30/2023)   SDOH Interventions:     Readmission Risk Interventions    03/19/2023    9:58 AM 11/05/2022    2:59 PM 11/05/2022   12:16 PM  Readmission Risk Prevention Plan  Transportation Screening Complete Complete Complete  Medication  Review Oceanographer) Complete Complete Complete  PCP or Specialist appointment within 3-5 days of discharge Complete Complete Complete  HRI or Home Care Consult Complete Complete Complete  SW Recovery Care/Counseling Consult Complete Complete Complete  Palliative Care Screening Complete Complete Complete  Skilled Nursing Facility Not Applicable Not Applicable Not Applicable

## 2023-12-02 NOTE — Progress Notes (Signed)
 [facility] Progress Note  Patient: [name]    FMW:[id number]  DOA: 11/28/2023     Date of Service: the patient was seen and examined on 12/02/2023  Chief Complaint  Patient presents with   Shortness of Breath   Brief hospital course: Taken from prior notes   Gary [name] is a 68 y.o. 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 Patient  presents to ED  from  rehab facility in [place]. EMS was called due sat in 70's . Per notes patient uses Bipap but facility does not provide this therapy.    Patient has frequent multiple hospitalization most recent from 10/11/2023-10/26/23 with diagnosis of acute on chronic hypoxic and hypercapnic respiratory failure,sepsis secondary to Pseudomonas  Pneumonia complicated by  Pseudomonas bacteremia in addition to AKI on CKDIIIa.  Of note s/p hospitalization patient was [facility] and thereafter he was discharged to SNF.    Patient was found to be with acute on chronic hypoxic and hypercapnic respiratory failure and was placed on BiPAP.  Initial chest x-ray concerning for pulmonary vascular congestion so he was given a dose of IV Lasix . CT chest with worsening atelectatic changes in right upper lobe, and no other focal abnormality.  Labs with worsening anemia with hemoglobin at 7.6-anemia panel ordered.  Respiratory viral panel negative, BNP normal at 59.  BUN 35 and creatinine at 1.52 with baseline around 1.2-1.3-not meeting the criteria for AKI.   Assessment and Plan:   # Acute on chronic respiratory failure with hypoxia and hypercapnia.  Likely secondary to not using BiPAP as recommended.  Patient has a chronic trach and uses 6 L of oxygen  at baseline.  - Improved back to baseline now Mildly elevated  procalcitonin so continuing-Levaquin  for 5 days -Continue with supplemental oxygen  -Continue with BiPAP at night and while sleeping -Continue with supportive care 8/18 started Mucinex  600 mg p.o. twice daily  # Acute metabolic encephalopathy: Resolved Likely secondary to significant hypercapnia on presentation, mentation now improved back to baseline after using BiPAP and improvement in hypercapnia. - Continue to monitor   # COPD exacerbation:  -Continue with steroid taper - Continue with bronchodilator and supportive care   # Acute on chronic HFpEF  Patient with significant lower extremity edema, BNP normal. On Torsemide  at home S/p IV lasix  40 8/17 increased Lasix  80 mg IV twice daily 8/17 Metolazone  5 mg x 1 dose given Monitor renal functions and urine output Fluid restriction 1.5 L/day Significant lower extremity edema, apply Ace wraps and keep legs elevated   CKD stage 3a, GFR 45-59 ml/min ([name]) Mild AKI with creatinine at 1.71, baseline around 1.2-1.3, some improvement of creatinine and now at 1.63 History of CKD stage III A. - Monitor renal function closely as patient is getting diuresed -Avoid nephrotoxins   Chronic colitis - Continue home mesalamine    Normocytic anemia Hemoglobin at 7.6 with no obvious bleeding.  Anemia panel with anemia of chronic disease, iron  deficiency and B12 of 380 goal above 400. History of prior GI bleed. - Check FOBT -s/p IV iron  x 1 dose - Continue oral supplement  -Monitor hemoglobin -Transfuse if below 7   Sleep apnea - Continue BiPAP at night   Morbid obesity ([name]) Estimated body mass index is 53 kg/m as  calculated from the following:   Height as of this encounter: 6' 4 (1.93 m).   Weight as of this encounter: 197.5 kg.    - This will complicate overall prognosis   Hypotension Blood pressure currently within goal. - Continue home midodrine   Anxiety, started Atarax   Anemia of chronic disease, monitor H&H.  Foley  catheter was discontinued on 8/17, patient is voiding well.   Body mass index is 53 kg/m.  Interventions:  Diet: Heart healthy diet, fluid striction 1.5 L/day DVT Prophylaxis: Subcutaneous Heparin     Advance goals of care discussion: Full code  Family Communication: family was not present at bedside, at the time of interview.  The pt provided permission to discuss medical plan with the family. Opportunity was given to ask question and all questions were answered satisfactorily.   Disposition:  Pt is from SNF, admitted with resp failure and volume overload, still has significant edema on IV Lasix , which precludes a safe discharge. Discharge to SNF, when stable, may need few days to improve.  Subjective: No significant events overnight, patient was resting all day and using BiPAP at nighttime. Patient agreed for Ace wraps to bilateral lower extremities due to significant edema Voiding well, no active issues   Physical Exam: General: NAD, lying comfortably Appear in no distress, affect appropriate Eyes: PERRLA ENT: Oral Mucosa Clear, moist  Neck: no JVD,  Cardiovascular: S1 and S2 Present, no Murmur,  Respiratory: Equal air entry bilaterally, s/p Trach intact, mild crackles bilaterally, chest congestion.  Minimal wheezing.   Abdomen: Bowel Sound present, Soft and no tenderness,  Skin: no rashes Extremities: 4+ pedal edema, no calf tenderness Neurologic: without any new focal findings Gait not checked due to patient safety concerns  Vitals:   12/02/23 0757 12/02/23 0800 12/02/23 1154 12/02/23 1510  BP:   125/68 120/75  Pulse:  71 73 81  Resp:  20 (!) 23 (!) 24  Temp:   97.9 F (36.6 C) 98.2 F (36.8 C)  TempSrc:   Oral Axillary  SpO2: 97% 100% 97% 97%  Weight:      Height:        Intake/Output Summary (Last 24 hours) at 12/02/2023 1619 Last data filed at 12/02/2023 1416 Gross per 24 hour  Intake 1680 ml  Output 2725 ml  Net -1045 ml   Filed Weights   11/28/23  1347  Weight: (!) 197.5 kg    Data Reviewed: I have personally reviewed and interpreted daily labs, tele strips, imagings as discussed above. I reviewed all nursing notes, pharmacy notes, vitals, pertinent old records I have discussed plan of care as described above with RN and patient/family.  CBC: Recent Labs  Lab 11/28/23 1352 11/28/23 2058 11/29/23 0938 12/02/23 0829  WBC 6.8 6.7 6.7 7.9  NEUTROABS 4.5  --   --   --   HGB 7.9* 7.6* 7.5* 8.2*  HCT 31.5* 29.9* 29.1* 32.7*  MCV 80.2 80.4 79.1* 80.1  PLT 200 182 206 210   Basic Metabolic Panel: Recent Labs  Lab 11/28/23 1500 11/28/23 2058 11/29/23 0938 11/30/23 1001 12/01/23 0747 12/02/23 0829  NA 141  --  142 142 143 144  K 3.7  --  3.6 3.6 3.5 3.9  CL 99  --  99 97* 98 95*  CO2 36*  --  33* 36* 36* 38*  GLUCOSE 89  --  147* 151* 117* 99  BUN 35*  --  33* 34* 36* 41*  CREATININE 1.52* 1.71* 1.66* 1.63* 1.50* 1.62*  CALCIUM  7.6*  --  7.9* 7.8* 7.8* 7.9*  MG  --   --   --   --  1.8 1.9  PHOS  --   --   --   --  2.2* 3.6    Studies: No results found.  Scheduled Meds:  escitalopram   20 mg Oral Daily   Fe Fum-Vit C-Vit B12-FA  1 capsule Oral BID   fluticasone  furoate-vilanterol  1 puff Inhalation Daily   furosemide   80 mg Intravenous BID   heparin   5,000 Units Subcutaneous Q8H   hydrOXYzine   25 mg Oral TID   ipratropium-albuterol   3 mL Nebulization BID   levofloxacin   750 mg Oral Daily   mesalamine   1.2 g Oral BID   midodrine   10 mg Oral TID WC   pantoprazole   40 mg Oral QHS   predniSONE   40 mg Oral Q breakfast   pregabalin   50 mg Oral Daily   spironolactone   50 mg Oral Daily   Continuous Infusions: PRN Meds: acetaminophen  **OR** acetaminophen , albuterol , artificial tears, ondansetron  **OR** ondansetron  (ZOFRAN ) IV, oxyCODONE , traZODone   Time spent: 55 minutes  Author: ELVAN SOR. MD [facility] 12/02/2023 4:19 PM  To reach On-call, see care teams to locate the attending and reach out to them  via [email or web address]. If 7PM-7AM, please contact night-coverage If you still have difficulty reaching the attending provider, please page the [facility] (Director on Call) for [facility] on amion for assistance.

## 2023-12-02 NOTE — Plan of Care (Signed)
  Problem: Education: Goal: Knowledge of General Education information will improve Description: Including pain rating scale, medication(s)/side effects and non-pharmacologic comfort measures Outcome: Progressing   Problem: Health Behavior/Discharge Planning: Goal: Ability to manage health-related needs will improve Outcome: Progressing   Problem: Clinical Measurements: Goal: Ability to maintain clinical measurements within normal limits will improve Outcome: Progressing Goal: Will remain free from infection Outcome: Progressing Goal: Diagnostic test results will improve Outcome: Progressing Goal: Respiratory complications will improve Outcome: Progressing Goal: Cardiovascular complication will be avoided Outcome: Progressing   Problem: Activity: Goal: Risk for activity intolerance will decrease Outcome: Progressing   Problem: Nutrition: Goal: Adequate nutrition will be maintained Outcome: Progressing   Problem: Coping: Goal: Level of anxiety will decrease Outcome: Progressing   Problem: Elimination: Goal: Will not experience complications related to bowel motility Outcome: Progressing Goal: Will not experience complications related to urinary retention Outcome: Progressing   Problem: Pain Managment: Goal: General experience of comfort will improve and/or be controlled Outcome: Progressing   Problem: Safety: Goal: Ability to remain free from injury will improve Outcome: Progressing   Problem: Skin Integrity: Goal: Risk for impaired skin integrity will decrease Outcome: Progressing   Problem: Education: Goal: Knowledge of disease or condition will improve Outcome: Progressing Goal: Knowledge of the prescribed therapeutic regimen will improve Outcome: Progressing Goal: Individualized Educational Video(s) Outcome: Progressing   Problem: Activity: Goal: Ability to tolerate increased activity will improve Outcome: Progressing Goal: Will verbalize the  importance of balancing activity with adequate rest periods Outcome: Progressing   Problem: Respiratory: Goal: Ability to maintain a clear airway will improve Outcome: Progressing Goal: Levels of oxygenation will improve Outcome: Progressing Goal: Ability to maintain adequate ventilation will improve Outcome: Progressing

## 2023-12-03 DIAGNOSIS — J9622 Acute and chronic respiratory failure with hypercapnia: Secondary | ICD-10-CM | POA: Diagnosis not present

## 2023-12-03 DIAGNOSIS — J9621 Acute and chronic respiratory failure with hypoxia: Secondary | ICD-10-CM | POA: Diagnosis not present

## 2023-12-03 LAB — CBC
HCT: 32 % — ABNORMAL LOW (ref 39.0–52.0)
Hemoglobin: 7.8 g/dL — ABNORMAL LOW (ref 13.0–17.0)
MCH: 20.1 pg — ABNORMAL LOW (ref 26.0–34.0)
MCHC: 24.4 g/dL — ABNORMAL LOW (ref 30.0–36.0)
MCV: 82.3 fL (ref 80.0–100.0)
Platelets: 190 K/uL (ref 150–400)
RBC: 3.89 MIL/uL — ABNORMAL LOW (ref 4.22–5.81)
RDW: 21.7 % — ABNORMAL HIGH (ref 11.5–15.5)
WBC: 8.2 K/uL (ref 4.0–10.5)
nRBC: 2.2 % — ABNORMAL HIGH (ref 0.0–0.2)

## 2023-12-03 LAB — BASIC METABOLIC PANEL WITH GFR
Anion gap: 10 (ref 5–15)
BUN: 48 mg/dL — ABNORMAL HIGH (ref 8–23)
CO2: 40 mmol/L — ABNORMAL HIGH (ref 22–32)
Calcium: 7.6 mg/dL — ABNORMAL LOW (ref 8.9–10.3)
Chloride: 94 mmol/L — ABNORMAL LOW (ref 98–111)
Creatinine, Ser: 1.89 mg/dL — ABNORMAL HIGH (ref 0.61–1.24)
GFR, Estimated: 39 mL/min — ABNORMAL LOW (ref >60)
Glucose, Bld: 114 mg/dL — ABNORMAL HIGH (ref 70–99)
Potassium: 3.8 mmol/L (ref 3.5–5.1)
Sodium: 144 mmol/L (ref 135–145)

## 2023-12-03 LAB — PHOSPHORUS: Phosphorus: 4.3 mg/dL (ref 2.5–4.6)

## 2023-12-03 LAB — MAGNESIUM: Magnesium: 2 mg/dL (ref 1.7–2.4)

## 2023-12-03 NOTE — Progress Notes (Addendum)
 0800 Patient very sleepy. Took pills without issues.  1000 Placed back on monitor after pulled telemetry monitor off. Blamed nurse for telemetry being pulled off. 1200 Resting quietly.  1500. Yelling for nurse. States I can't breathe. When nurse runs into room to assist patient he is not SOB but wants his TV remote fixed. Informed he can live without his TV. Patient found to be wet with urine and has had a BM.  Patient refused to let nurse change his bed or get up so he can be changed. 1600 Patient yelling for nurse.

## 2023-12-03 NOTE — Progress Notes (Signed)
 Patient resting in be. BiPAP on per order. Patient refuses to be repositioned by staff. Patient states he can reposition himself and if he wants help he will call. C/O being cold, warm blankets provided. Call light and personal items with in reach.

## 2023-12-03 NOTE — Progress Notes (Signed)
 [facility] Progress Note  Patient: Gary [name]    FMW:[id number]  DOA: 11/28/2023     Date of Service: the patient was seen and examined on 12/03/2023  Chief Complaint  Patient presents with   Shortness of Breath   Brief hospital course: Taken from prior notes   Gary [name] is a 68 y.o. 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 Patient  presents to ED  from  rehab facility in [place]. EMS was called due sat in 70's . Per notes patient uses Bipap but facility does not provide this therapy.    Patient has frequent multiple hospitalization most recent from 10/11/2023-10/26/23 with diagnosis of acute on chronic hypoxic and hypercapnic respiratory failure,sepsis secondary to Pseudomonas  Pneumonia complicated by  Pseudomonas bacteremia in addition to AKI on CKDIIIa.  Of note s/p hospitalization patient was discharged to [facility] and thereafter he was discharged to SNF.    Patient was found to be with acute on chronic hypoxic and hypercapnic respiratory failure and was placed on BiPAP.  Initial chest x-ray concerning for pulmonary vascular congestion so he was given a dose of IV Lasix . CT chest with worsening atelectatic changes in right upper lobe, and no other focal abnormality.  Labs with worsening anemia with hemoglobin at 7.6-anemia panel ordered.  Respiratory viral panel negative, BNP normal at 59.  BUN 35 and creatinine at 1.52 with baseline around 1.2-1.3-not meeting the criteria for AKI.   Assessment and Plan:   # Acute on chronic respiratory failure with hypoxia and hypercapnia.  Likely secondary to not using BiPAP as recommended.  Patient has a chronic trach and uses 6 L of oxygen  at baseline.  - Improved back to baseline now Mildly elevated  procalcitonin so continuing-Levaquin  for 5 days -Continue with supplemental oxygen  -Continue with BiPAP at night and while sleeping -Continue with supportive care 8/18 started Mucinex  600 mg p.o. twice daily  # Acute metabolic encephalopathy: Resolved Likely secondary to significant hypercapnia on presentation, mentation now improved back to baseline after using BiPAP and improvement in hypercapnia. - Continue to monitor   # COPD exacerbation:  -Continue with steroid taper - Continue with bronchodilator and supportive care   # Acute on chronic HFpEF  Patient with significant lower extremity edema, BNP normal. On Torsemide  at home S/p IV lasix  40 8/17 increased Lasix  80 mg IV twice daily 8/17 Metolazone  5 mg x 1 dose given Monitor renal functions and urine output Fluid restriction 1.5 L/day Significant lower extremity edema, apply Ace wraps and keep legs elevated   CKD stage 3a, GFR 45-59 ml/min ([name]) History of CKD stage III A. sCr 1.89   - Monitor renal function closely as patient is getting diuresed -Avoid nephrotoxins   Chronic colitis - Continue home mesalamine    Normocytic anemia Hemoglobin at 7.6 with no obvious bleeding.  Anemia panel with anemia of chronic disease, iron  deficiency and B12 of 380 goal above 400. History of prior GI bleed. - Check FOBT -s/p IV iron  x 1 dose - Continue oral supplement  -Monitor hemoglobin -Transfuse if below 7   Sleep apnea - Continue BiPAP at night   Morbid obesity ([name]) Estimated body mass index is 53 kg/m as calculated from the following:   Height as of this encounter: 6' 4 (  1.93 m).   Weight as of this encounter: 197.5 kg.    - This will complicate overall prognosis   Hypotension Blood pressure currently within goal. - Continue home midodrine   Anxiety, started Atarax   Anemia of chronic disease, monitor H&H.  Foley catheter was discontinued on 8/17, patient is voiding well.   Body mass index is 53 kg/m.   Interventions:  Diet: Heart healthy diet, fluid striction 1.5 L/day DVT Prophylaxis: Subcutaneous Heparin     Advance goals of care discussion: Full code  Family Communication: family was not present at bedside, at the time of interview.  The pt provided permission to discuss medical plan with the family. Opportunity was given to ask question and all questions were answered satisfactorily.   Disposition:  Pt is from SNF, admitted with resp failure and volume overload, still has significant edema on IV Lasix , which precludes a safe discharge. Discharge to SNF, when stable, may need few days to improve.  Subjective: No significant events overnight, patient was sleepy in the morning time, denied any active issues, seems to be resting comfortably.   Physical Exam: General: NAD, lying comfortably Appear in no distress, affect appropriate Eyes: PERRLA ENT: Oral Mucosa Clear, moist  Neck: no JVD,  Cardiovascular: S1 and S2 Present, no Murmur,  Respiratory: Equal air entry bilaterally, s/p Trach intact, mild crackles bilaterally, chest congestion.  Minimal wheezing.   Abdomen: Bowel Sound present, Soft and no tenderness,  Skin: no rashes Extremities: 4+ pedal edema, no calf tenderness Neurologic: without any new focal findings Gait not checked due to patient safety concerns  Vitals:   12/03/23 0749 12/03/23 1143 12/03/23 1533 12/03/23 1600  BP: 100/65 126/66  133/73  Pulse:  90  86  Resp:    (!) 24  Temp: 98.6 F (37 C) 98.5 F (36.9 C) 97.7 F (36.5 C)   TempSrc: Axillary  Axillary   SpO2: 95% 98%  90%  Weight:      Height:        Intake/Output Summary (Last 24 hours) at 12/03/2023 1721 Last data filed at 12/03/2023 1300 Gross per 24 hour  Intake 180 ml  Output 1700 ml  Net -1520 ml   Filed Weights   11/28/23 1347  Weight: (!) 197.5 kg    Data Reviewed: I have personally reviewed and interpreted daily labs, tele strips, imagings as discussed above. I reviewed all  nursing notes, pharmacy notes, vitals, pertinent old records I have discussed plan of care as described above with RN and patient/family.  CBC: Recent Labs  Lab 11/28/23 1352 11/28/23 2058 11/29/23 0938 12/02/23 0829 12/03/23 0943  WBC 6.8 6.7 6.7 7.9 8.2  NEUTROABS 4.5  --   --   --   --   HGB 7.9* 7.6* 7.5* 8.2* 7.8*  HCT 31.5* 29.9* 29.1* 32.7* 32.0*  MCV 80.2 80.4 79.1* 80.1 82.3  PLT 200 182 206 210 190   Basic Metabolic Panel: Recent Labs  Lab 11/29/23 0938 11/30/23 1001 12/01/23 0747 12/02/23 0829 12/03/23 0943  NA 142 142 143 144 144  K 3.6 3.6 3.5 3.9 3.8  CL 99 97* 98 95* 94*  CO2 33* 36* 36* 38* 40*  GLUCOSE 147* 151* 117* 99 114*  BUN 33* 34* 36* 41* 48*  CREATININE 1.66* 1.63* 1.50* 1.62* 1.89*  CALCIUM  7.9* 7.8* 7.8* 7.9* 7.6*  MG  --   --  1.8 1.9 2.0  PHOS  --   --  2.2* 3.6 4.3    Studies: No results  found.  Scheduled Meds:  escitalopram   20 mg Oral Daily   Fe Fum-Vit C-Vit B12-FA  1 capsule Oral BID   fluticasone  furoate-vilanterol  1 puff Inhalation Daily   furosemide   80 mg Intravenous BID   guaiFENesin   600 mg Oral BID   heparin   5,000 Units Subcutaneous Q8H   hydrOXYzine   25 mg Oral TID   ipratropium-albuterol   3 mL Nebulization BID   mesalamine   1.2 g Oral BID   midodrine   10 mg Oral TID WC   pantoprazole   40 mg Oral QHS   predniSONE   40 mg Oral Q breakfast   pregabalin   50 mg Oral Daily   spironolactone   50 mg Oral Daily   Continuous Infusions: PRN Meds: acetaminophen  **OR** acetaminophen , albuterol , artificial tears, ondansetron  **OR** ondansetron  (ZOFRAN ) IV, oxyCODONE , traZODone   Time spent: 40 minutes  Author: ELVAN SOR. MD [facility] 12/03/2023 5:21 PM  To reach On-call, see care teams to locate the attending and reach out to them via [email or web address]. If 7PM-7AM, please contact night-coverage If you still have difficulty reaching the attending provider, please page the [facility] (Director on Call) for [facility]  on amion for assistance.

## 2023-12-03 NOTE — Progress Notes (Signed)
 Refused AM lab draw. Dr Lawence notified no new orders. Patient has become anxious and agitated removing his BiPAP, respiratory notified. Patient banging on table and yelling at staff.

## 2023-12-03 NOTE — Plan of Care (Signed)
  Problem: Pain Managment: Goal: General experience of comfort will improve and/or be controlled Outcome: Progressing   Problem: Safety: Goal: Ability to remain free from injury will improve Outcome: Progressing   Problem: Skin Integrity: Goal: Risk for impaired skin integrity will decrease Outcome: Progressing   Problem: Coping: Goal: Level of anxiety will decrease Outcome: Not Progressing Note: Patient anxious, agitated. Yelling at staff, pounding on tables.

## 2023-12-04 DIAGNOSIS — J9621 Acute and chronic respiratory failure with hypoxia: Secondary | ICD-10-CM | POA: Diagnosis not present

## 2023-12-04 DIAGNOSIS — J9622 Acute and chronic respiratory failure with hypercapnia: Secondary | ICD-10-CM | POA: Diagnosis not present

## 2023-12-04 LAB — BLOOD GAS, ARTERIAL
Acid-Base Excess: 21.9 mmol/L — ABNORMAL HIGH (ref 0.0–2.0)
Bicarbonate: 54.8 mmol/L — ABNORMAL HIGH (ref 20.0–28.0)
Delivery systems: POSITIVE
FIO2: 40 %
O2 Saturation: 89.6 %
PEEP: 10 cmH2O
Patient temperature: 37
Pressure control: 20 cmH2O
pCO2 arterial: 114 mmHg (ref 32–48)
pH, Arterial: 7.29 — ABNORMAL LOW (ref 7.35–7.45)
pO2, Arterial: 56 mmHg — ABNORMAL LOW (ref 83–108)

## 2023-12-04 LAB — BASIC METABOLIC PANEL WITH GFR
Anion gap: 11 (ref 5–15)
BUN: 52 mg/dL — ABNORMAL HIGH (ref 8–23)
CO2: 42 mmol/L — ABNORMAL HIGH (ref 22–32)
Calcium: 7.6 mg/dL — ABNORMAL LOW (ref 8.9–10.3)
Chloride: 91 mmol/L — ABNORMAL LOW (ref 98–111)
Creatinine, Ser: 1.9 mg/dL — ABNORMAL HIGH (ref 0.61–1.24)
GFR, Estimated: 38 mL/min — ABNORMAL LOW (ref >60)
Glucose, Bld: 79 mg/dL (ref 70–99)
Potassium: 3.9 mmol/L (ref 3.5–5.1)
Sodium: 144 mmol/L (ref 135–145)

## 2023-12-04 LAB — CBC
HCT: 33.8 % — ABNORMAL LOW (ref 39.0–52.0)
Hemoglobin: 8.1 g/dL — ABNORMAL LOW (ref 13.0–17.0)
MCH: 20 pg — ABNORMAL LOW (ref 26.0–34.0)
MCHC: 24 g/dL — ABNORMAL LOW (ref 30.0–36.0)
MCV: 83.7 fL (ref 80.0–100.0)
Platelets: 181 K/uL (ref 150–400)
RBC: 4.04 MIL/uL — ABNORMAL LOW (ref 4.22–5.81)
RDW: 21.8 % — ABNORMAL HIGH (ref 11.5–15.5)
WBC: 9.7 K/uL (ref 4.0–10.5)
nRBC: 2.6 % — ABNORMAL HIGH (ref 0.0–0.2)

## 2023-12-04 LAB — PHOSPHORUS: Phosphorus: 4.1 mg/dL (ref 2.5–4.6)

## 2023-12-04 LAB — MAGNESIUM: Magnesium: 2 mg/dL (ref 1.7–2.4)

## 2023-12-04 MED ORDER — SPIRONOLACTONE 25 MG PO TABS
25.0000 mg | ORAL_TABLET | Freq: Every day | ORAL | Status: DC
  Administered 2023-12-04 – 2023-12-05 (×2): 25 mg via ORAL
  Filled 2023-12-04 (×2): qty 1

## 2023-12-04 MED ORDER — MIDODRINE HCL 5 MG PO TABS
5.0000 mg | ORAL_TABLET | Freq: Three times a day (TID) | ORAL | Status: DC
  Administered 2023-12-05 – 2023-12-19 (×42): 5 mg via ORAL
  Filled 2023-12-04 (×42): qty 1

## 2023-12-04 NOTE — Progress Notes (Signed)
 OT Cancellation Note  Patient Details Name: MORLEY GAUMER MRN: [id number] DOB: 1955/11/27   Cancelled Treatment:    Reason Eval/Treat Not Completed: Patient declined, no reason specified;Other (comment). OT continues to follow pt for therapy services. Upon arrival to pt room, pt supine in bed, notably lethargic. Appears mildly confused (disoriented to date). Per team has been calling out to nurses station frequently. Pt politely declines therapy session this date stating I'll be ready tomorrow. Will hold at this time and re-attempt at a later date/time as available and pt medically appropriate.   Jhonny Pelton, M.S., OTR/L 12/04/23, 11:23 AM

## 2023-12-04 NOTE — TOC Progression Note (Signed)
 Transition of Care [facility]) - Progression Note    Patient Details  Name: Gary [name] MRN: [id number] Date of Birth: 1955/12/13  Transition [facility] Care [facility]) CM/SW Contact  [name], LCSW Phone Number: 12/04/2023, 12:39 PM  Clinical Narrative:  CSW provided CMS scores for facilities that have offered a bed and those that are considering. Tried discussing with patient but he appear lethargic and difficult to understand. CSW left them in the room for him to review when able.   Expected Discharge Plan: Skilled Nursing Facility Barriers to Discharge: Continued Medical Work up               Expected Discharge Plan and Services     Post Acute Care Choice: Skilled Nursing Facility Living arrangements for the past 2 months: Skilled Nursing Facility [facility])                                       Social Drivers of Health (SDOH) Interventions SDOH Screenings   Food Insecurity: No Food Insecurity (11/30/2023)  Housing: High Risk (11/30/2023)  Transportation Needs: No Transportation Needs (11/30/2023)  Utilities: Not At Risk (11/30/2023)  Depression (PHQ2-9): Low Risk  (06/07/2021)  Financial Resource Strain: High Risk (11/21/2022)   Received from [facility]  Physical Activity: Insufficiently Active (05/03/2017)  Social Connections: Moderately Isolated (11/30/2023)  Stress: Stress Concern Present (11/20/2022)   Received from [facility]  Tobacco Use: Medium Risk (11/30/2023)    Readmission Risk Interventions    03/19/2023    9:58 AM 11/05/2022    2:59 PM 11/05/2022   12:16 PM  Readmission Risk Prevention Plan  Transportation Screening Complete Complete Complete  Medication Review Oceanographer) Complete Complete Complete  PCP or Specialist appointment within 3-5 days of discharge Complete Complete Complete  HRI or Home Care Consult Complete Complete Complete  SW Recovery Care/Counseling Consult Complete Complete Complete  Palliative Care Screening Complete  Complete Complete  Skilled Nursing Facility Not Applicable Not Applicable Not Applicable

## 2023-12-04 NOTE — Progress Notes (Signed)
 PT Cancellation Note  Patient Details Name: EVRETT HAKIM MRN: [id number] DOB: [birth year]   Cancelled Treatment:    Reason Eval/Treat Not Completed: Other (comment) (Patient declined due to fatigue and not feeling well today. He states he will be ready for therapy tomorrow. Will continue with attempts as appropriate.)  Randine Essex, PT, MPT   Randine LULLA Essex 12/04/2023, 11:36 AM

## 2023-12-04 NOTE — Plan of Care (Signed)
°  Problem: Respiratory: °Goal: Ability to maintain a clear airway will improve °Outcome: Progressing °  °

## 2023-12-04 NOTE — Progress Notes (Signed)
 [facility] Progress Note  Patient: Gary [name]    FMW:[id number]  DOA: 11/28/2023     Date of Service: the patient was seen and examined on 12/04/2023  Chief Complaint  Patient presents with   Shortness of Breath   Brief hospital course: Taken from prior notes   Gary [name] is a 68 y.o. 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 Patient  presents to ED  from  rehab facility in [place]. EMS was called due sat in 70's . Per notes patient uses Bipap but facility does not provide this therapy.    Patient has frequent multiple hospitalization most recent from 10/11/2023-10/26/23 with diagnosis of acute on chronic hypoxic and hypercapnic respiratory failure,sepsis secondary to Pseudomonas  Pneumonia complicated by  Pseudomonas bacteremia in addition to AKI on CKDIIIa.  Of note s/p hospitalization patient was discharged to [facility] and thereafter he was discharged to SNF.    Patient was found to be with acute on chronic hypoxic and hypercapnic respiratory failure and was placed on BiPAP.  Initial chest x-ray concerning for pulmonary vascular congestion so he was given a dose of IV Lasix . CT chest with worsening atelectatic changes in right upper lobe, and no other focal abnormality.  Labs with worsening anemia with hemoglobin at 7.6-anemia panel ordered.  Respiratory viral panel negative, BNP normal at 59.  BUN 35 and creatinine at 1.52 with baseline around 1.2-1.3-not meeting the criteria for AKI.   Assessment and Plan:   # Acute on chronic respiratory failure with hypoxia and hypercapnia.  #Likely OHS Likely secondary to not using BiPAP as recommended.  Patient has a chronic trach and uses 6 L of oxygen  at baseline.  - Improved back to baseline  now Mildly elevated procalcitonin response 5-day course of Levaquin  -Continue with supplemental oxygen  -Continue with BiPAP at night and while sleeping -Continue with supportive care   # Acute metabolic encephalopathy: Resolved Secondary to hypercapnia. - Continue BiPAP  # COPD exacerbation:  -Continue with steroid taper - Continue with bronchodilator and supportive care   # Acute on chronic HFpEF  Remains volume overloaded.  Renal function worsening this a.m. Hold diuresis for now. Continue spironolactone  Un[place] boots   CKD stage 3a, GFR 45-59 ml/min ([name]) History of CKD stage III A. Serum creatinine worsened this a.m. in the setting of diuresis      Latest Ref Rng & Units 12/04/2023    8:36 AM 12/03/2023    9:43 AM 12/02/2023    8:29 AM  BMP  Glucose 70 - 99 mg/dL 79  885  99   BUN 8 - 23 mg/dL 52  48  41   Creatinine 0.61 - 1.24 mg/dL 8.09  8.10  8.37   Sodium 135 - 145 mmol/L 144  144  144   Potassium 3.5 - 5.1 mmol/L 3.9  3.8  3.9   Chloride 98 - 111 mmol/L 91  94  95   CO2 22 - 32 mmol/L 42  40  38   Calcium  8.9 - 10.3 mg/dL 7.6  7.6  7.9   Hold further IV diuresis.   Chronic colitis - Continue home mesalamine    Normocytic anemia    Latest Ref Rng & Units 12/04/2023    8:36 AM 12/03/2023    9:43  AM 12/02/2023    8:29 AM  CBC  WBC 4.0 - 10.5 K/uL 9.7  8.2  7.9   Hemoglobin 13.0 - 17.0 g/dL 8.1  7.8  8.2   Hematocrit 39.0 - 52.0 % 33.8  32.0  32.7   Platelets 150 - 400 K/uL 181  190  210     Hemoglobin stable, no obvious bleeding.  Anemia panel consistent with anemia of chronic disease, iron  deficiency. B12 of 380  History of prior GI bleed. -s/p IV iron  x 1 dose -Maintain B12 above 100 - Continue oral supplement  -Monitor hemoglobin -Transfuse if below 7   Sleep apnea OHS - Continue BiPAP at night   Morbid obesity ([name]) Estimated body mass index is 53 kg/m as calculated from the following:   Height as of this encounter: 6' 4 (1.93 m).   Weight  as of this encounter: 197.5 kg.    - This will complicate overall prognosis   Hypotension Blood pressure currently within goal. - Continue home midodrine   Anxiety, started Atarax     Foley catheter was discontinued on 8/17, patient is voiding well.   Body mass index is 53 kg/m.  Interventions:  Diet: Heart healthy diet, fluid striction 1.5 L/day DVT Prophylaxis: Subcutaneous Heparin     Advance goals of care discussion: Full code  Family Communication: family was not present at bedside, at the time of interview.  The pt provided permission to discuss medical plan with the family. Opportunity was given to ask question and all questions were answered satisfactorily.   Disposition:  Pt is from SNF, admitted with resp failure and volume overload, still has significant edema on IV Lasix , which precludes a safe discharge. Discharge to SNF, when stable, may need few days to improve.  Subjective: No significant events overnight, patient was sleepy in the morning time, denied any active issues, seems to be resting comfortably.   Physical Exam: Physical Exam  Constitutional: In no distress.  Neck: Trach in place  Cardiovascular: Normal rate, regular rhythm.  2+ lower extremity edema bilaterally Pulmonary: Non labored breathing on supplemental oxygen , difficult to auscultate lung fields given body habitus Abdominal: Soft. Non distended and non tender  Neurological: Alert and oriented to person, place, and time. Non focal  Skin: Skin is warm and dry.    Vitals:   12/04/23 1000 12/04/23 1100 12/04/23 1255 12/04/23 1700  BP:   115/72 122/75  Pulse: 84 82 82 75  Resp: 20 (!) 22 19 19   Temp:   98.2 F (36.8 C) 98.6 F (37 C)  TempSrc:   Axillary Axillary  SpO2: 91% 93% 92% 92%  Weight:      Height:        Intake/Output Summary (Last 24 hours) at 12/04/2023 1925 Last data filed at 12/04/2023 1454 Gross per 24 hour  Intake 240 ml  Output 2200 ml  Net -1960 ml   Filed  Weights   11/28/23 1347  Weight: (!) 197.5 kg    Data Reviewed:   CBC: Recent Labs  Lab 11/28/23 1352 11/28/23 2058 11/29/23 0938 12/02/23 0829 12/03/23 0943 12/04/23 0836  WBC 6.8 6.7 6.7 7.9 8.2 9.7  NEUTROABS 4.5  --   --   --   --   --   HGB 7.9* 7.6* 7.5* 8.2* 7.8* 8.1*  HCT 31.5* 29.9* 29.1* 32.7* 32.0* 33.8*  MCV 80.2 80.4 79.1* 80.1 82.3 83.7  PLT 200 182 206 210 190 181   Basic Metabolic Panel: Recent Labs  Lab 11/30/23  1001 12/01/23 0747 12/02/23 0829 12/03/23 0943 12/04/23 0836  NA 142 143 144 144 144  K 3.6 3.5 3.9 3.8 3.9  CL 97* 98 95* 94* 91*  CO2 36* 36* 38* 40* 42*  GLUCOSE 151* 117* 99 114* 79  BUN 34* 36* 41* 48* 52*  CREATININE 1.63* 1.50* 1.62* 1.89* 1.90*  CALCIUM  7.8* 7.8* 7.9* 7.6* 7.6*  MG  --  1.8 1.9 2.0 2.0  PHOS  --  2.2* 3.6 4.3 4.1    Studies: No results found.  Scheduled Meds:  escitalopram   20 mg Oral Daily   Fe Fum-Vit C-Vit B12-FA  1 capsule Oral BID   fluticasone  furoate-vilanterol  1 puff Inhalation Daily   guaiFENesin   600 mg Oral BID   heparin   5,000 Units Subcutaneous Q8H   hydrOXYzine   25 mg Oral TID   ipratropium-albuterol   3 mL Nebulization BID   mesalamine   1.2 g Oral BID   midodrine   5 mg Oral TID WC   pantoprazole   40 mg Oral QHS   predniSONE   40 mg Oral Q breakfast   pregabalin   50 mg Oral Daily   spironolactone   25 mg Oral Daily   Continuous Infusions: PRN Meds: acetaminophen  **OR** acetaminophen , albuterol , artificial tears, ondansetron  **OR** ondansetron  (ZOFRAN ) IV, oxyCODONE , traZODone   Time spent: 35 minutes  Author: Alban Pepper. MD [facility] 12/04/2023 7:25 PM  To reach On-call, see care teams to locate the attending and reach out to them via [email or web address]. If 7PM-7AM, please contact night-coverage If you still have difficulty reaching the attending provider, please page the [facility] (Director on Call) for [facility] on amion for assistance.

## 2023-12-05 ENCOUNTER — Inpatient Hospital Stay

## 2023-12-05 DIAGNOSIS — D500 Iron deficiency anemia secondary to blood loss (chronic): Secondary | ICD-10-CM

## 2023-12-05 DIAGNOSIS — J9621 Acute and chronic respiratory failure with hypoxia: Secondary | ICD-10-CM | POA: Diagnosis not present

## 2023-12-05 DIAGNOSIS — J9622 Acute and chronic respiratory failure with hypercapnia: Secondary | ICD-10-CM | POA: Diagnosis not present

## 2023-12-05 DIAGNOSIS — K922 Gastrointestinal hemorrhage, unspecified: Secondary | ICD-10-CM

## 2023-12-05 DIAGNOSIS — Z930 Tracheostomy status: Secondary | ICD-10-CM

## 2023-12-05 LAB — CBC WITH DIFFERENTIAL/PLATELET
Abs Granulocyte: 10.3 K/uL — ABNORMAL HIGH (ref 1.5–6.5)
Abs Immature Granulocytes: 0.14 K/uL — ABNORMAL HIGH (ref 0.00–0.07)
Basophils Absolute: 0 K/uL (ref 0.0–0.1)
Basophils Relative: 0 %
Eosinophils Absolute: 0 K/uL (ref 0.0–0.5)
Eosinophils Relative: 0 %
HCT: 25.7 % — ABNORMAL LOW (ref 39.0–52.0)
Hemoglobin: 7 g/dL — ABNORMAL LOW (ref 13.0–17.0)
Immature Granulocytes: 1 %
Lymphocytes Relative: 8 %
Lymphs Abs: 1 K/uL (ref 0.7–4.0)
MCH: 21.5 pg — ABNORMAL LOW (ref 26.0–34.0)
MCHC: 27.2 g/dL — ABNORMAL LOW (ref 30.0–36.0)
MCV: 79.1 fL — ABNORMAL LOW (ref 80.0–100.0)
Monocytes Absolute: 0.9 K/uL (ref 0.1–1.0)
Monocytes Relative: 7 %
Neutro Abs: 10.3 K/uL — ABNORMAL HIGH (ref 1.7–7.7)
Neutrophils Relative %: 84 %
Platelets: 171 K/uL (ref 150–400)
RBC: 3.25 MIL/uL — ABNORMAL LOW (ref 4.22–5.81)
RDW: 21.3 % — ABNORMAL HIGH (ref 11.5–15.5)
Smear Review: NORMAL
WBC: 12.3 K/uL — ABNORMAL HIGH (ref 4.0–10.5)
nRBC: 2 % — ABNORMAL HIGH (ref 0.0–0.2)

## 2023-12-05 LAB — BLOOD GAS, VENOUS
Acid-Base Excess: 26.7 mmol/L — ABNORMAL HIGH (ref 0.0–2.0)
Acid-Base Excess: 21.3 mmol/L — ABNORMAL HIGH (ref 0.0–2.0)
Bicarbonate: 56.2 mmol/L — ABNORMAL HIGH (ref 20.0–28.0)
Bicarbonate: 50.6 mmol/L — ABNORMAL HIGH (ref 20.0–28.0)
FIO2: 40 %
O2 Saturation: 52.1 %
O2 Saturation: 73.2 %
Patient temperature: 37
Patient temperature: 37
pCO2, Ven: 79 mmHg (ref 44–60)
pCO2, Ven: 78 mmHg (ref 44–60)
pH, Ven: 7.46 — ABNORMAL HIGH (ref 7.25–7.43)
pH, Ven: 7.42 (ref 7.25–7.43)
pO2, Ven: 32 mmHg (ref 32–45)
pO2, Ven: 42 mmHg (ref 32–45)

## 2023-12-05 LAB — CBC
HCT: 31.1 % — ABNORMAL LOW (ref 39.0–52.0)
HCT: 29.3 % — ABNORMAL LOW (ref 39.0–52.0)
Hemoglobin: 7.8 g/dL — ABNORMAL LOW (ref 13.0–17.0)
Hemoglobin: 7.6 g/dL — ABNORMAL LOW (ref 13.0–17.0)
MCH: 20.3 pg — ABNORMAL LOW (ref 26.0–34.0)
MCH: 20.8 pg — ABNORMAL LOW (ref 26.0–34.0)
MCHC: 25.1 g/dL — ABNORMAL LOW (ref 30.0–36.0)
MCHC: 25.9 g/dL — ABNORMAL LOW (ref 30.0–36.0)
MCV: 80.8 fL (ref 80.0–100.0)
MCV: 80.3 fL (ref 80.0–100.0)
Platelets: 186 K/uL (ref 150–400)
Platelets: 182 K/uL (ref 150–400)
RBC: 3.85 MIL/uL — ABNORMAL LOW (ref 4.22–5.81)
RBC: 3.65 MIL/uL — ABNORMAL LOW (ref 4.22–5.81)
RDW: 22.5 % — ABNORMAL HIGH (ref 11.5–15.5)
RDW: 21.8 % — ABNORMAL HIGH (ref 11.5–15.5)
WBC: 10.7 K/uL — ABNORMAL HIGH (ref 4.0–10.5)
WBC: 10.7 K/uL — ABNORMAL HIGH (ref 4.0–10.5)
nRBC: 2.4 % — ABNORMAL HIGH (ref 0.0–0.2)
nRBC: 2.3 % — ABNORMAL HIGH (ref 0.0–0.2)

## 2023-12-05 LAB — PREPARE RBC (CROSSMATCH)

## 2023-12-05 LAB — COMPREHENSIVE METABOLIC PANEL WITH GFR
ALT: 8 U/L (ref 0–44)
AST: 12 U/L — ABNORMAL LOW (ref 15–41)
Albumin: 2.7 g/dL — ABNORMAL LOW (ref 3.5–5.0)
Alkaline Phosphatase: 62 U/L (ref 38–126)
Anion gap: 15 (ref 5–15)
BUN: 50 mg/dL — ABNORMAL HIGH (ref 8–23)
CO2: 40 mmol/L — ABNORMAL HIGH (ref 22–32)
Calcium: 7.7 mg/dL — ABNORMAL LOW (ref 8.9–10.3)
Chloride: 90 mmol/L — ABNORMAL LOW (ref 98–111)
Creatinine, Ser: 1.69 mg/dL — ABNORMAL HIGH (ref 0.61–1.24)
GFR, Estimated: 44 mL/min — ABNORMAL LOW (ref >60)
Glucose, Bld: 85 mg/dL (ref 70–99)
Potassium: 3.5 mmol/L (ref 3.5–5.1)
Sodium: 145 mmol/L (ref 135–145)
Total Bilirubin: 0.4 mg/dL (ref 0.0–1.2)
Total Protein: 6.6 g/dL (ref 6.5–8.1)

## 2023-12-05 LAB — LACTIC ACID, PLASMA: Lactic Acid, Venous: 1.5 mmol/L (ref 0.5–1.9)

## 2023-12-05 MED ORDER — SODIUM CHLORIDE 0.9% IV SOLUTION: Freq: Once | INTRAVENOUS | Status: DC

## 2023-12-05 MED ORDER — PANTOPRAZOLE SODIUM 40 MG IV SOLR
40.0000 mg | Freq: Two times a day (BID) | INTRAVENOUS | Status: DC
  Administered 2023-12-05 – 2023-12-10 (×10): 40 mg via INTRAVENOUS
  Filled 2023-12-05 (×10): qty 10

## 2023-12-05 MED ORDER — SODIUM CHLORIDE 0.9 % IV BOLUS
500.0000 mL | Freq: Once | INTRAVENOUS | Status: AC
  Administered 2023-12-05: 500 mL via INTRAVENOUS

## 2023-12-05 MED ORDER — IOHEXOL 350 MG/ML SOLN
100.0000 mL | Freq: Once | INTRAVENOUS | Status: AC | PRN
  Administered 2023-12-05: 100 mL via INTRAVENOUS

## 2023-12-05 MED ORDER — PREDNISONE 20 MG PO TABS
20.0000 mg | ORAL_TABLET | Freq: Every day | ORAL | Status: AC
Start: 2023-12-06 — End: 2023-12-09
  Administered 2023-12-06 – 2023-12-09 (×4): 20 mg via ORAL
  Filled 2023-12-05 (×4): qty 1

## 2023-12-05 MED ORDER — SODIUM CHLORIDE 0.9% IV SOLUTION: Freq: Once | INTRAVENOUS | Status: AC

## 2023-12-05 NOTE — Plan of Care (Addendum)
 Since 700AM today - patient has have 10 med - large extremely bloody BMs (ALL requiring TTL bed changes).  Minimal stool, mainly blood consisting of Bright red and Dark red CLOTS.  Hospitalist informed early this morning, rounded and also witnessed one of these occurences.    Hemoglobin (last 7.8),  BP (last 94/52) and alertness (sleeping a lot which is not normal during the day for this patient) are declining  and slowly trending down.  Hospitalist, Chrg RN, AD and GI have been alerted.   - 1unit blood is infusing now. - CT ABD ordered and completed. - Blood study ordered, to be completed 8/22.  X2 more BMs  - more of the same. Intensivist asked to round and did - plan is to order CTA and continue to watch patient as needed.  Hemo now  7.6, BP 110/78 (map 85) and HR 90. Patient did communicate with ICU Docs, but reports great weakness (which is understandable). 500 Bolus also ordered and will also give a 2nd unit RBC.

## 2023-12-05 NOTE — Progress Notes (Signed)
 OT Cancellation Note  Patient Details Name: TARYN NAVE MRN: [id number] DOB: 03-Aug-1955   Cancelled Treatment:    Reason Eval/Treat Not Completed: Patient not medically ready;Other (comment) (pt recently placed on bipap, nurse requests we hold per discussion with PT; OT will hold at this time and re attempt as able.ALLIE Therisa Sheffield, OTD OTR/L  12/05/23, 2:12 PM

## 2023-12-05 NOTE — Progress Notes (Signed)
 PT Cancellation Note  Patient Details Name: Gary [name] MRN: [id number] DOB: 1955-06-07   Cancelled Treatment:    Reason Eval/Treat Not Completed: Medical issues which prohibited therapy. Pt currently on BiPAP, nursing contacted and requested PT/OT be held this date secondary to concerns with CO2 levels.  Will attempt to see pt at a future date/time as medically appropriate.    CHARM Glendia Bertin PT, DPT 12/05/23, 1:53 PM

## 2023-12-05 NOTE — Progress Notes (Addendum)
 Came in at shift change and pt's BP is 86/54 (66), other VSS. Erminio NP at the unit and notified about soft BP. Restarted IVF 500 bolus, and started 1 unit of PRBC. Most recent BP is 98/63. ICU provider is following as well. Trending hgb q6, most recent hgb is 7.0.   Off note, also asked Erminio NP if patient can have p.o medication since pt is NPO, per provider ok to have p.o medication. No other concern at the moment. Plan of care continued.   Update: recent BP is 86/61 (71), other VSS. Recent hgb is 7.6 after blood transfusion. Notified Erminio NP. Provider order to transfuse another 1 unit of PRBC.

## 2023-12-05 NOTE — TOC Progression Note (Addendum)
 Transition of Care [place]) - Progression Note    Patient Details  Name: Gary [name] MRN: [id number] Date of Birth: 10-07-1955  Transition of Care [facility]) CM/SW Contact  [name], LCSW Phone Number: 12/05/2023, 11:57 AM  Clinical Narrative:  CSW went by room to discuss bed offers but patient was not in the room. Will try again later.   1:14 pm: Went back by the room but patient was on bipap.  Expected Discharge Plan: Skilled Nursing Facility Barriers to Discharge: Continued Medical Work up               Expected Discharge Plan and Services     Post Acute Care Choice: Skilled Nursing Facility Living arrangements for the past 2 months: Skilled Nursing Facility [facility])                                       Social Drivers of Health (SDOH) Interventions SDOH Screenings   Food Insecurity: No Food Insecurity (11/30/2023)  Housing: High Risk (11/30/2023)  Transportation Needs: No Transportation Needs (11/30/2023)  Utilities: Not At Risk (11/30/2023)  Depression (PHQ2-9): Low Risk  (06/07/2021)  Financial Resource Strain: High Risk (11/21/2022)   Received from [facility]  Physical Activity: Insufficiently Active (05/03/2017)  Social Connections: Moderately Isolated (11/30/2023)  Stress: Stress Concern Present (11/20/2022)   Received from [facility]  Tobacco Use: Medium Risk (11/30/2023)    Readmission Risk Interventions    03/19/2023    9:58 AM 11/05/2022    2:59 PM 11/05/2022   12:16 PM  Readmission Risk Prevention Plan  Transportation Screening Complete Complete Complete  Medication Review [employer]) Complete Complete Complete  PCP or Specialist appointment within 3-5 days of discharge Complete Complete Complete  HRI or Home Care Consult Complete Complete Complete  SW Recovery Care/Counseling Consult Complete Complete Complete  Palliative Care Screening Complete Complete Complete  Skilled Nursing Facility Not Applicable Not Applicable Not  Applicable

## 2023-12-05 NOTE — Progress Notes (Signed)
 [facility] Progress Note  Patient: Gary [name]    FMW:[id number]  DOA: 11/28/2023     Date of Service: the patient was seen and examined on 12/05/2023  Chief Complaint  Patient presents with   Shortness of Breath   Brief hospital course: Taken from prior notes   Gary [name] is a 68 y.o. 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 Patient  presents to ED  from  rehab facility in [place]. EMS was called due sat in 70's . Per notes patient uses Bipap but facility does not provide this therapy.    Patient has frequent multiple hospitalization most recent from 10/11/2023-10/26/23 with diagnosis of acute on chronic hypoxic and hypercapnic respiratory failure,sepsis secondary to Pseudomonas  Pneumonia complicated by  Pseudomonas bacteremia in addition to AKI on CKDIIIa.  Of note s/p hospitalization patient was discharged to [facility] and thereafter he was discharged to SNF.    Patient was found to be with acute on chronic hypoxic and hypercapnic respiratory failure and was placed on BiPAP.  Initial chest x-ray concerning for pulmonary vascular congestion so he was given a dose of IV Lasix . CT chest with worsening atelectatic changes in right upper lobe, and no other focal abnormality.  Labs with worsening anemia with hemoglobin at 7.6-anemia panel ordered.  Respiratory viral panel negative, BNP normal at 59.  BUN 35 and creatinine at 1.52 with baseline around 1.2-1.3-not meeting the criteria for AKI.   Assessment and Plan:   # Acute on chronic respiratory failure with hypoxia and hypercapnia.  #Likely OHS #metabolic alkalosis compensatory for Hypercapnic RF  Likely secondary to not using BiPAP as recommended.  Patient has a chronic trach and uses 6 L of  oxygen  at baseline.  - Improved back to baseline now Mildly elevated procalcitonin s/p 5-day course of Levaquin  -Continue with supplemental oxygen  -Continue with BiPAP at night and while sleeping -Continue with supportive care  #LGIB  Patient with multiple maroon colored stools. He has had this in the past. During hospitalization 04/2023 it was thought to be related to diverticular bleed vs SCAD. Because patient had recent colonoscopy showing mod to severe colitis no further GI intervention was recommended. Discussed with GI team this hospitalization and they do not recommend further luminal investigation at this time.  CTA of the abd was performed without evidence of extravasation. His blood pressure is trending down.   PCCM was consulted due soft blood pressures and persistent bleeding.   F/u CBC s/p 1u PRBCs Bolus 500cc  Continue to trend CBCs Consider repeat CTA of abd if bleeding more HDS   # Acute metabolic encephalopathy: Resolved Secondary to hypercapnia. - Continue BiPAP  # COPD exacerbation:  -Continue with steroid taper - Continue with bronchodilator and supportive care   # Acute on chronic HFpEF  Remains volume overloaded.  Renal function improved this a.m. with holding diuresis.  Hold spironolactone , wean midodrine  as able.  Unna boots ordered.    CKD stage 3a, GFR 45-59 ml/min ([name]) History of CKD stage III A. Serum creatinine improved w/ holding diuresis.       Latest Ref Rng & Units 12/05/2023    8:29 AM 12/04/2023    8:36 AM 12/03/2023    9:43 AM  BMP  Glucose 70 - 99 mg/dL  85  79  114   BUN 8 - 23 mg/dL 50  52  48   Creatinine 0.61 - 1.24 mg/dL 8.30  8.09  8.10   Sodium 135 - 145 mmol/L 145  144  144   Potassium 3.5 - 5.1 mmol/L 3.5  3.9  3.8   Chloride 98 - 111 mmol/L 90  91  94   CO2 22 - 32 mmol/L 40  42  40   Calcium  8.9 - 10.3 mg/dL 7.7  7.6  7.6   Will consider    Chronic colitis - Continue home mesalamine    Normocytic anemia    Latest Ref  Rng & Units 12/05/2023    4:44 PM 12/05/2023    8:29 AM 12/04/2023    8:36 AM  CBC  WBC 4.0 - 10.5 K/uL 10.7  10.7  9.7   Hemoglobin 13.0 - 17.0 g/dL 7.6  7.8  8.1   Hematocrit 39.0 - 52.0 % 29.3  31.1  33.8   Platelets 150 - 400 K/uL 182  186  181     Hemoglobin stable, GIB per above.  Anemia panel consistent with anemia of chronic disease, iron  deficiency. B12 of 380  History of prior GI bleed. -s/p IV iron  x 1 dose -Maintain B12 above 400 - Continue oral Fe supplement  -Monitor hemoglobin per above  -Transfuse 1u prbcs  -Transfuse if below 7   Sleep apnea OHS - Continue BiPAP at night   Morbid obesity ([name]) Estimated body mass index is 53 kg/m as calculated from the following:   Height as of this encounter: 6' 4 (1.93 m).   Weight as of this encounter: 197.5 kg.  - This will complicate overall prognosis   Hypotension Blood pressure soft this PM.  - Continue home midodrine  -Continue IVF and blood   Anxiety, Continue Atarax     Foley catheter was discontinued on 8/17, patient is voiding well.   Body mass index is 53 kg/m.  Interventions:  Diet: Heart healthy diet, fluid striction 1.5 L/day DVT Prophylaxis: Subcutaneous Heparin     Advance goals of care discussion: Full code  Family Communication: family was not present at bedside, at the time of interview.  The pt provided permission to discuss medical plan with the family. Opportunity was given to ask question and all questions were answered satisfactorily.   Disposition:  Pt is from SNF, admitted with resp failure and volume overload, still has significant edema on IV Lasix , which precludes a safe discharge. Discharge to SNF, when stable, may need few days to improve.  Subjective: Multiple bloody bowel movements. Some drowsiness but improved with bipap.    Physical Exam:    Constitutional: In no distress.  Neck: trach in place  Cardiovascular: Normal rate, regular rhythm. 2+ bilateral lower extremity  edema  Pulmonary: Non labored breathing O2, Body habitus makes auscultating lung fields difficult  Abdominal: Soft. Obese. Non distended and non tender Musculoskeletal: Normal range of motion.     Neurological: Alert and oriented to person, place, and time. Non focal  Skin: Skin is warm and dry.     Vitals:   12/05/23 1400 12/05/23 1425 12/05/23 1500 12/05/23 1530  BP:  101/72 98/60 (!) 94/52  Pulse: 84 85 82 80  Resp: 16 16 16 18   Temp:   98.8 F (37.1 C) 98.4 F (36.9 C)  TempSrc:   Oral Oral  SpO2: 98% 100% 100% 100%  Weight:      Height:  Intake/Output Summary (Last 24 hours) at 12/05/2023 1703 Last data filed at 12/05/2023 1515 Gross per 24 hour  Intake 500 ml  Output 700 ml  Net -200 ml   Filed Weights   11/28/23 1347  Weight: (!) 197.5 kg    Data Reviewed:   CBC: Recent Labs  Lab 12/02/23 0829 12/03/23 0943 12/04/23 0836 12/05/23 0829 12/05/23 1644  WBC 7.9 8.2 9.7 10.7* 10.7*  HGB 8.2* 7.8* 8.1* 7.8* 7.6*  HCT 32.7* 32.0* 33.8* 31.1* 29.3*  MCV 80.1 82.3 83.7 80.8 80.3  PLT 210 190 181 186 182   Basic Metabolic Panel: Recent Labs  Lab 12/01/23 0747 12/02/23 0829 12/03/23 0943 12/04/23 0836 12/05/23 0829  NA 143 144 144 144 145  K 3.5 3.9 3.8 3.9 3.5  CL 98 95* 94* 91* 90*  CO2 36* 38* 40* 42* 40*  GLUCOSE 117* 99 114* 79 85  BUN 36* 41* 48* 52* 50*  CREATININE 1.50* 1.62* 1.89* 1.90* 1.69*  CALCIUM  7.8* 7.9* 7.6* 7.6* 7.7*  MG 1.8 1.9 2.0 2.0  --   PHOS 2.2* 3.6 4.3 4.1  --     Studies: CT ANGIO GI BLEED Result Date: 12/05/2023 EXAM: CTA ABDOMEN AND PELVIS WITH CONTRAST 12/05/2023 11:51:05 AM TECHNIQUE: CTA images of the abdomen and pelvis with intravenous contrast. Three-dimensional MIP/volume rendered formations were performed. Automated exposure control, iterative reconstruction, and/or weight based adjustment of the mA/kV was utilized to reduce the radiation dose to as low as reasonably achievable. COMPARISON: CT of the abdomen  and pelvis dated 10/11/2023. CLINICAL HISTORY: Frequent bloody bowel movements. FINDINGS: VASCULATURE: Bulbous outpouching of the infrarenal abdominal aorta anteriorly to the right, which is seen on image 110 of series 6. The focal outpouching measures approximately 2.5 x 1.0 x 2.7 cm. The abdominal aorta is otherwise normal in caliber. There is common origin of the celiac and superior mesenteric arteries. GI BLEED: No active extravasation of contrast within the GI tract. AORTA: Bulbous outpouching of the infrarenal abdominal aorta anteriorly to the right, which is seen on image 110 of series 6. The focal outpouching measures approximately 2.5 x 1.0 x 2.7 cm. The abdominal aorta is otherwise normal in caliber. CELIAC TRUNK: No acute finding. No occlusion or significant stenosis. SUPERIOR MESEMTRIC ARTERY: No acute finding. No occlusion or significant stenosis. Common origin with the celiac artery. INFERIOR MESENTERIC ARTERY: No acute finding. No occlusion or significant stenosis. RENAL ARTERIES: No acute finding. No occlusion or significant stenosis. ILIAC ARTERIES: No acute finding. No occlusion or significant stenosis. ABDOMEN/PELVIS: The patient is status post right hemicolectomy. There are numerous colonic diverticula present. The distal sigmoid colon and rectum are featureless. There is mild thickening of the wall of the proximal sigmoid colon. Small bowel is unremarkable. There is a periumbilical fat-containing hernia. LOWER CHEST: Streaky atelectasis within the lower lobes bilaterally. LIVER: The liver is unremarkable. GALLBLADDER AND BILE DUCTS: There are numerous stones within the gallbladder. No biliary ductal dilatation. SPLEEN: The spleen is unremarkable. PANCREAS: The pancreas is unremarkable. ADRENAL GLANDS: Bilateral adrenal glands demonstrate no acute abnormality. KIDNEYS, URETERS AND BLADDER: No stones in the kidneys or ureters. No hydronephrosis. No perinephric or periureteral stranding. Urinary  bladder is unremarkable. GI AND BOWEL: The patient is status post right hemicolectomy. There are numerous colonic diverticula present. The distal sigmoid colon and rectum are featureless. There is mild thickening of the wall of the proximal sigmoid colon. Small bowel is unremarkable. REPRODUCTIVE: Reproductive organs are unremarkable. PERITONEUM AND RETROPERITONEUM: No ascites or free  air. LYMPH NODES: No lymphadenopathy. BONES AND SOFT TISSUES: There is moderate chronic degenerative disc disease at L3-4, L4-5 and L5-S1. There is a periumbilical fat-containing hernia. IMPRESSION: 1. No active GI bleeding. 2. Status post right hemicolectomy. Numerous colonic diverticula. Mild thickening of the wall of the proximal sigmoid colon. Featureless distal sigmoid colon and rectum. The findings are compatible with mild colitis and may represent the source of gastrointestinal tract hemorrhage 3. Bulbous outpouching of the infrarenal abdominal aorta anteriorly to the right, measuring approximately 2.5 x 1.0 x 2.7 cm. 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 4. Numerous stones within the gallbladder. 5. Periumbilical fat-containing hernia. Electronically signed by: Timothy Berrigan MD 12/05/2023 12:07 PM EDT RP Workstation: [id number]    Scheduled Meds:  sodium chloride    Intravenous Once   escitalopram   20 mg Oral Daily   Fe Fum-Vit C-Vit B12-FA  1 capsule Oral BID   fluticasone  furoate-vilanterol  1 puff Inhalation Daily   guaiFENesin   600 mg Oral BID   heparin   5,000 Units Subcutaneous Q8H   hydrOXYzine   25 mg Oral TID   ipratropium-albuterol   3 mL Nebulization BID   mesalamine   1.2 g Oral BID   midodrine   5 mg Oral TID WC   pantoprazole   40 mg Oral QHS   pregabalin   50 mg Oral Daily   Continuous Infusions:  sodium chloride      PRN Meds: acetaminophen  **OR** acetaminophen , albuterol , artificial tears, ondansetron  **OR** ondansetron   (ZOFRAN ) IV, oxyCODONE , traZODone   Time spent: 35 minutes  Author: Alban Pepper. MD [facility] 12/05/2023 5:03 PM  To reach On-call, see care teams to locate the attending and reach out to them via [email or web address]. If 7PM-7AM, please contact night-coverage If you still have difficulty reaching the attending provider, please page the [facility] (Director on Call) for [facility] on amion for assistance.
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
## 2023-12-05 NOTE — Plan of Care (Signed)
  Problem: Elimination: Goal: Will not experience complications related to bowel motility Outcome: Adequate for Discharge   

## 2023-12-06 ENCOUNTER — Other Ambulatory Visit

## 2023-12-06 DIAGNOSIS — J9611 Chronic respiratory failure with hypoxia: Secondary | ICD-10-CM | POA: Diagnosis not present

## 2023-12-06 DIAGNOSIS — K5791 Diverticulosis of intestine, part unspecified, without perforation or abscess with bleeding: Secondary | ICD-10-CM | POA: Diagnosis not present

## 2023-12-06 DIAGNOSIS — J9612 Chronic respiratory failure with hypercapnia: Secondary | ICD-10-CM | POA: Diagnosis not present

## 2023-12-06 DIAGNOSIS — G4733 Obstructive sleep apnea (adult) (pediatric): Secondary | ICD-10-CM | POA: Diagnosis not present

## 2023-12-06 DIAGNOSIS — J9621 Acute and chronic respiratory failure with hypoxia: Secondary | ICD-10-CM | POA: Diagnosis not present

## 2023-12-06 DIAGNOSIS — K529 Noninfective gastroenteritis and colitis, unspecified: Secondary | ICD-10-CM | POA: Diagnosis not present

## 2023-12-06 DIAGNOSIS — D509 Iron deficiency anemia, unspecified: Secondary | ICD-10-CM

## 2023-12-06 DIAGNOSIS — K922 Gastrointestinal hemorrhage, unspecified: Secondary | ICD-10-CM | POA: Diagnosis not present

## 2023-12-06 DIAGNOSIS — Z9049 Acquired absence of other specified parts of digestive tract: Secondary | ICD-10-CM

## 2023-12-06 DIAGNOSIS — J9622 Acute and chronic respiratory failure with hypercapnia: Secondary | ICD-10-CM | POA: Diagnosis not present

## 2023-12-06 DIAGNOSIS — N189 Chronic kidney disease, unspecified: Secondary | ICD-10-CM

## 2023-12-06 LAB — BASIC METABOLIC PANEL WITH GFR
Anion gap: 13 (ref 5–15)
BUN: 46 mg/dL — ABNORMAL HIGH (ref 8–23)
CO2: 37 mmol/L — ABNORMAL HIGH (ref 22–32)
Calcium: 7.2 mg/dL — ABNORMAL LOW (ref 8.9–10.3)
Chloride: 93 mmol/L — ABNORMAL LOW (ref 98–111)
Creatinine, Ser: 1.53 mg/dL — ABNORMAL HIGH (ref 0.61–1.24)
GFR, Estimated: 50 mL/min — ABNORMAL LOW (ref >60)
Glucose, Bld: 83 mg/dL (ref 70–99)
Potassium: 3.4 mmol/L — ABNORMAL LOW (ref 3.5–5.1)
Sodium: 143 mmol/L (ref 135–145)

## 2023-12-06 LAB — CBC
HCT: 27 % — ABNORMAL LOW (ref 39.0–52.0)
Hemoglobin: 7.6 g/dL — ABNORMAL LOW (ref 13.0–17.0)
MCH: 22.2 pg — ABNORMAL LOW (ref 26.0–34.0)
MCHC: 28.1 g/dL — ABNORMAL LOW (ref 30.0–36.0)
MCV: 78.9 fL — ABNORMAL LOW (ref 80.0–100.0)
Platelets: 166 K/uL (ref 150–400)
RBC: 3.42 MIL/uL — ABNORMAL LOW (ref 4.22–5.81)
RDW: 20.6 % — ABNORMAL HIGH (ref 11.5–15.5)
WBC: 9.7 K/uL (ref 4.0–10.5)
nRBC: 2.7 % — ABNORMAL HIGH (ref 0.0–0.2)

## 2023-12-06 LAB — HEMOGLOBIN AND HEMATOCRIT, BLOOD
HCT: 29 % — ABNORMAL LOW (ref 39.0–52.0)
HCT: 30.8 % — ABNORMAL LOW (ref 39.0–52.0)
Hemoglobin: 8.2 g/dL — ABNORMAL LOW (ref 13.0–17.0)
Hemoglobin: 8.9 g/dL — ABNORMAL LOW (ref 13.0–17.0)

## 2023-12-06 LAB — MAGNESIUM: Magnesium: 2.1 mg/dL (ref 1.7–2.4)

## 2023-12-06 LAB — APTT: aPTT: 29 s (ref 24–36)

## 2023-12-06 LAB — PREPARE RBC (CROSSMATCH)

## 2023-12-06 LAB — PROTIME-INR
INR: 1.1 (ref 0.8–1.2)
Prothrombin Time: 14.8 s (ref 11.4–15.2)

## 2023-12-06 LAB — C-REACTIVE PROTEIN: CRP: 3.3 mg/dL — ABNORMAL HIGH (ref <1.0)

## 2023-12-06 MED ORDER — POTASSIUM CHLORIDE 20 MEQ PO PACK
20.0000 meq | PACK | Freq: Once | ORAL | Status: AC
  Administered 2023-12-06: 20 meq via ORAL
  Filled 2023-12-06: qty 1

## 2023-12-06 MED ORDER — SODIUM CHLORIDE 0.9% IV SOLUTION: Freq: Once | INTRAVENOUS | Status: DC

## 2023-12-06 MED ORDER — TECHNETIUM TC 99M-LABELED RED BLOOD CELLS IV KIT
22.4800 | PACK | Freq: Once | INTRAVENOUS | Status: AC | PRN
  Administered 2023-12-10: 21.87 via INTRAVENOUS

## 2023-12-06 MED ORDER — SODIUM CHLORIDE 0.9 % IV BOLUS
500.0000 mL | Freq: Once | INTRAVENOUS | Status: AC
  Administered 2023-12-06: 500 mL via INTRAVENOUS

## 2023-12-06 MED ORDER — SODIUM CHLORIDE 0.9% IV SOLUTION: Freq: Once | INTRAVENOUS | Status: AC

## 2023-12-06 NOTE — Progress Notes (Signed)
 OT Cancellation Note  Patient Details Name: EASHAN SCHIPANI MRN: [id number] DOB: 29-Nov-1955   Cancelled Treatment:    Reason Eval/Treat Not Completed: Other (comment). Received clearance from MD to attempt therapeutic intervention this session however, pt declines. OT to re-attempt at next available time.   Izetta Claude, MS, OTR/L , CBIS ascom [phone]  12/06/23, 3:48 PM

## 2023-12-06 NOTE — TOC Progression Note (Signed)
 Transition of Care [facility]) - Progression Note    Patient Details  Name: Gary [name] MRN: [id number] Date of Birth: 1955-05-28  Transition of Care [facility]) CM/SW Contact  [name], LCSW Phone Number: 12/06/2023, 2:51 PM  Clinical Narrative:  CSW met with patient and reviewed bed offers and star ratings for [facility] and [facility]. Encouraged patient to review and determine preference.   Expected Discharge Plan: Skilled Nursing Facility Barriers to Discharge: Continued Medical Work up               Expected Discharge Plan and Services     Post Acute Care Choice: Skilled Nursing Facility Living arrangements for the past 2 months: Skilled Nursing Facility [facility])                                       Social Drivers of Health (SDOH) Interventions SDOH Screenings   Food Insecurity: No Food Insecurity (11/30/2023)  Housing: High Risk (11/30/2023)  Transportation Needs: No Transportation Needs (11/30/2023)  Utilities: Not At Risk (11/30/2023)  Depression (PHQ2-9): Low Risk  (06/07/2021)  Financial Resource Strain: High Risk (11/21/2022)   Received from [facility]  Physical Activity: Insufficiently Active (05/03/2017)  Social Connections: Moderately Isolated (11/30/2023)  Stress: Stress Concern Present (11/20/2022)   Received from [facility]  Tobacco Use: Medium Risk (11/30/2023)    Readmission Risk Interventions    03/19/2023    9:58 AM 11/05/2022    2:59 PM 11/05/2022   12:16 PM  Readmission Risk Prevention Plan  Transportation Screening Complete Complete Complete  Medication Review [employer]) Complete Complete Complete  PCP or Specialist appointment within 3-5 days of discharge Complete Complete Complete  HRI or Home Care Consult Complete Complete Complete  SW Recovery Care/Counseling Consult Complete Complete Complete  Palliative Care Screening Complete Complete Complete  Skilled Nursing Facility Not Applicable Not Applicable Not  Applicable

## 2023-12-06 NOTE — Progress Notes (Signed)
 [facility] Progress Note  Patient: Gary [name]    FMW:[id number]  DOA: 11/28/2023     Date of Service: the patient was seen and examined on 12/06/2023  Chief Complaint  Patient presents with   Shortness of Breath   Brief hospital course: Taken from prior notes   DEVESH MONFORTE is a 68 y.o. 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 Patient  presents to ED  from  rehab facility in [place]. EMS was called due sat in 70's . Per notes patient uses Bipap but facility does not provide this therapy.    Patient has frequent multiple hospitalization most recent from 10/11/2023-10/26/23 with diagnosis of acute on chronic hypoxic and hypercapnic respiratory failure,sepsis secondary to Pseudomonas  Pneumonia complicated by  Pseudomonas bacteremia in addition to AKI on CKDIIIa.  Of note s/p hospitalization patient was discharged to [facility] and thereafter he was discharged to SNF.    Patient was found to be with acute on chronic hypoxic and hypercapnic respiratory failure and was placed on BiPAP.  Initial chest x-ray concerning for pulmonary vascular congestion so he was given a dose of IV Lasix . CT chest with worsening atelectatic changes in right upper lobe, and no other focal abnormality.  Labs with worsening anemia with hemoglobin at 7.6-anemia panel ordered.  Respiratory viral panel negative, BNP normal at 59.  BUN 35 and creatinine at 1.52 with baseline around 1.2-1.3-not meeting the criteria for AKI.   Assessment and Plan:  #LGIB  Likely diverticular  Patient with multiple maroon colored stools. He has had this in the past. During hospitalization 04/2023 it was thought to be related to diverticular bleed vs SCAD. Because patient had recent colonoscopy  showing mod to severe colitis no further GI intervention was recommended.   CTA of the abd was performed without evidence of extravasation.  Patient's blood pressure remained soft.  He received an additional 2 units overnight.  He has had minimal bowel's overnight however.  CTA of the abdomen was performed again with no evidence of extravasation.  No evidence of shock, lactate within normal limits.   PCCM and GI consulted appreciate assistance.  Continue to trend H&H F/u coag studies  Tagged red blood cell scan pending Flex sig w/ GI in the AM  Transfuse for Hgb<7  # Acute on chronic respiratory failure with hypoxia and hypercapnia.  #Likely OHS #metabolic alkalosis compensatory for Hypercapnic RF  Likely secondary to not using BiPAP as recommended.  Patient has a chronic trach and uses 6 L of oxygen  at baseline.  - Improved back to baseline now Mildly elevated procalcitonin s/p 5-day course of Levaquin  -Continue with supplemental oxygen  during the day  -Continue with BiPAP at night and while sleeping -Continue with supportive care   # Acute metabolic encephalopathy: Resolved Secondary to hypercapnia. - Continue BiPAP  # COPD exacerbation:  -Continue with steroid taper - Continue with bronchodilator and supportive care   # Acute on chronic HFpEF  Remains volume overloaded.  Renal function improved this a.m. with holding diuresis.  Hold spironolactone , wean midodrine  as able.  Will likely need diuresis  Unna boots ordered.    CKD stage 3a, GFR 45-59 ml/min ([name]) History of CKD stage III A. Serum creatinine improved w/ holding diuresis.  Latest Ref Rng & Units 12/06/2023    3:04 AM 12/05/2023    8:29 AM 12/04/2023    8:36 AM  BMP  Glucose 70 - 99 mg/dL 83  85  79   BUN 8 - 23 mg/dL 46  50  52   Creatinine 0.61 - 1.24 mg/dL 8.46  8.30  8.09   Sodium 135 - 145 mmol/L 143  145  144   Potassium 3.5 - 5.1 mmol/L 3.4  3.5  3.9   Chloride 98 - 111 mmol/L 93  90  91    CO2 22 - 32 mmol/L 37  40  42   Calcium  8.9 - 10.3 mg/dL 7.2  7.7  7.6   Will consider   Hypokalemia  Mild.  Replace   Chronic colitis - Continue home mesalamine    Normocytic anemia    Latest Ref Rng & Units 12/06/2023   10:32 AM 12/06/2023    3:04 AM 12/05/2023    9:53 PM  CBC  WBC 4.0 - 10.5 K/uL  9.7  12.3   Hemoglobin 13.0 - 17.0 g/dL 8.2  7.6  7.0   Hematocrit 39.0 - 52.0 % 29.0  27.0  25.7   Platelets 150 - 400 K/uL  166  171     Hemoglobin stable, GIB per above.  Anemia panel consistent with anemia of chronic disease, iron  deficiency. B12 of 380  Iron /TIBC/Ferritin/ %Sat    Component Value Date/Time   IRON  19 (L) 11/29/2023 0137   TIBC 305 11/29/2023 0137   FERRITIN 10 (L) 11/28/2023 1825   IRONPCTSAT 6 (L) 11/29/2023 0137   History of prior GI bleed. -s/p IV iron  x 1 dose -Maintain B12 above 400 -Continue oral Fe supplementation  -Monitor hemoglobin per above  -Transfuse additoinal 2u given persistent soft blood pressures and inadequate response to blood given over night.    Sleep apnea OHS - Continue BiPAP at night   Morbid obesity ([name]) Estimated body mass index is 53 kg/m as calculated from the following:   Height as of this encounter: 6' 4 (1.93 m).   Weight as of this encounter: 197.5 kg.  - This will complicate overall prognosis   Hypotension Blood pressure soft this PM.  - Continue home midodrine  -Continue IVF and blood   Anxiety, Continue Atarax   Foley catheter was discontinued on 8/17, patient is voiding well.   Body mass index is 53 kg/m.  Interventions:  Diet: Heart healthy diet, fluid striction 1.5 L/day DVT Prophylaxis: Subcutaneous Heparin     Advance goals of care discussion: Full code  Family Communication: family was not present at bedside, at the time of interview.  The pt provided permission to discuss medical plan with the family. Opportunity was given to ask question and all questions were answered satisfactorily.    Disposition:  Pt is from SNF, admitted with resp failure and volume overload and now LGIB, still has significant edema IV lasix  on hold due to hypotension. Continues to bleed. Plan for flex sig in the AM  Discharge to SNF, when stable.  Subjective: No bowel movements recorded overnight.  Did have small dark maroon appearing stool while at bedside. Does not have abdominal pain. No lightheadedness or dizziness.    Physical Exam:  Constitutional: In no distress.  Neck: trach in place  Cardiovascular: Normal rate, regular rhythm. 2+ bilateral lower extremity edema  Pulmonary: Non labored breathing O2, Body habitus makes auscultating lung fields difficult  Abdominal: Soft. Obese. Non distended and non tender Musculoskeletal: Normal  range of motion.     Neurological: Alert and oriented to person, place, and time. Non focal  Skin: Skin is warm and dry.     Vitals:   12/06/23 1127 12/06/23 1145 12/06/23 1220 12/06/23 1430  BP: 99/70 99/70 98/64  95/60  Pulse: 85 83 94 78  Resp: 16 (!) 24 18 20   Temp:  98.5 F (36.9 C) 98.7 F (37.1 C) 98.9 F (37.2 C)  TempSrc:  Oral Oral Oral  SpO2: 94% 97% 96% 99%  Weight:      Height:        Intake/Output Summary (Last 24 hours) at 12/06/2023 1742 Last data filed at 12/06/2023 1600 Gross per 24 hour  Intake 1476.1 ml  Output 550 ml  Net 926.1 ml   Filed Weights   11/28/23 1347  Weight: (!) 197.5 kg    Data Reviewed:   CBC: Recent Labs  Lab 12/04/23 0836 12/05/23 0829 12/05/23 1644 12/05/23 2153 12/06/23 0304 12/06/23 1032  WBC 9.7 10.7* 10.7* 12.3* 9.7  --   NEUTROABS  --   --   --  10.3*  --   --   HGB 8.1* 7.8* 7.6* 7.0* 7.6* 8.2*  HCT 33.8* 31.1* 29.3* 25.7* 27.0* 29.0*  MCV 83.7 80.8 80.3 79.1* 78.9*  --   PLT 181 186 182 171 166  --    Basic Metabolic Panel: Recent Labs  Lab 12/01/23 0747 12/02/23 0829 12/03/23 0943 12/04/23 0836 12/05/23 0829 12/06/23 0304  NA 143 144 144 144 145 143  K 3.5 3.9 3.8 3.9 3.5  3.4*  CL 98 95* 94* 91* 90* 93*  CO2 36* 38* 40* 42* 40* 37*  GLUCOSE 117* 99 114* 79 85 83  BUN 36* 41* 48* 52* 50* 46*  CREATININE 1.50* 1.62* 1.89* 1.90* 1.69* 1.53*  CALCIUM  7.8* 7.9* 7.6* 7.6* 7.7* 7.2*  MG 1.8 1.9 2.0 2.0  --  2.1  PHOS 2.2* 3.6 4.3 4.1  --   --     Studies: CT ANGIO GI BLEED Result Date: 12/05/2023 CLINICAL DATA:  GI bleed EXAM: CTA ABDOMEN AND PELVIS WITHOUT AND WITH CONTRAST TECHNIQUE: Multidetector CT imaging of the abdomen and pelvis was performed using the standard protocol during bolus administration of intravenous contrast. Multiplanar reconstructed images and MIPs were obtained and reviewed to evaluate the vascular anatomy. RADIATION DOSE REDUCTION: This exam was performed according to the departmental dose-optimization program which includes automated exposure control, adjustment of the mA and/or kV according to patient size and/or use of iterative reconstruction technique. 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 No dissection. Celiac: Patent without evidence of aneurysm, dissection, vasculitis or significant stenosis. SMA: Patent without evidence of aneurysm, dissection, vasculitis or significant stenosis. Renals: Both renal arteries are patent without evidence of aneurysm, dissection, vasculitis, fibromuscular dysplasia or significant stenosis. IMA: Patent without evidence of aneurysm, dissection, vasculitis or significant stenosis. Inflow: Patent without evidence of aneurysm, dissection, vasculitis or significant stenosis. Proximal Outflow: Bilateral common femoral and visualized portions of the superficial and profunda femoral arteries are patent without evidence of aneurysm, dissection, vasculitis or significant stenosis. Veins: No obvious venous abnormality within the limitations of this arterial phase  study. Review of the MIP images confirms the above findings. NON-VASCULAR Lower chest: Dependent atelectasis in the lower lobes. No effusions. Hepatobiliary: Layering gallstones within the gallbladder. No biliary ductal dilatation. No focal  hepatic abnormality. Pancreas: No focal abnormality or ductal dilatation. Spleen: No focal abnormality.  Normal size. Adrenals/Urinary Tract: Adrenal glands unremarkable. 1.9 cm cyst in the midpole of the left kidney. No hydronephrosis. Urinary bladder unremarkable. Stomach/Bowel: Wall thickening noted in the proximal to mid sigmoid colon with surrounding inflammation, most compatible with colitis although follow-up is recommended to exclude malignancy. Colonic diverticulosis. Stomach and small bowel decompressed. Lymphatic: No adenopathy Reproductive: No visible focal abnormality. Other: No free fluid or free air. Musculoskeletal: No acute bony abnormality. IMPRESSION: VASCULAR No evidence of active contrast extravasation. 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 Recommend follow-up ultrasound every 3 years. (Ref.: J Vasc Surg. 2018; 67:2-77 and J Am Coll Radiol 2013;10(10):789-794.) NON-VASCULAR Wall thickening and surrounding inflammation involving the proximal and mid sigmoid colon most likely colitis and likely the source of GI bleed. However, malignancy could have a similar appearance. Recommend GI consultation and follow-up after treatment and acute symptoms resolve to exclude malignancy. Cholelithiasis.  No CT evidence of acute cholecystitis. Dependent atelectasis in the lower lobes. Colonic diverticulosis. Electronically Signed   By: Franky Crease M.D.   On: 12/05/2023 19:26    Scheduled Meds:  sodium chloride    Intravenous Once   sodium chloride    Intravenous Once   sodium chloride    Intravenous Once   sodium chloride    Intravenous Once   escitalopram   20 mg Oral Daily   Fe Fum-Vit C-Vit B12-FA  1 capsule Oral BID   fluticasone   furoate-vilanterol  1 puff Inhalation Daily   guaiFENesin   600 mg Oral BID   hydrOXYzine   25 mg Oral TID   ipratropium-albuterol   3 mL Nebulization BID   mesalamine   1.2 g Oral BID   midodrine   5 mg Oral TID WC   pantoprazole  (PROTONIX ) IV  40 mg Intravenous Q12H   potassium chloride   20 mEq Oral Once   predniSONE   20 mg Oral Q breakfast   pregabalin   50 mg Oral Daily   Continuous Infusions:   PRN Meds: acetaminophen  **OR** acetaminophen , albuterol , artificial tears, ondansetron  **OR** ondansetron  (ZOFRAN ) IV, oxyCODONE , technetium labeled red blood cells, traZODone   Time spent: 35 minutes  Author: Alban Pepper. MD [facility] 12/06/2023 5:42 PM  To reach On-call, see care teams to locate the attending and reach out to them via [email or web address]. If 7PM-7AM, please contact night-coverage If you still have difficulty reaching the attending provider, please page the [facility] (Director on Call) for [facility] on amion for assistance.

## 2023-12-06 NOTE — Progress Notes (Signed)
   12/06/23 1220  Assess: MEWS Score  Temp 98.7 F (37.1 C)  BP 98/64  Pulse Rate 94  Resp 18  Level of Consciousness Responds to Voice  SpO2 96 %  O2 Device Nasal Cannula  O2 Flow Rate (L/min) 6 L/min  Assess: MEWS Score  MEWS Temp 0  MEWS Systolic 1  MEWS Pulse 0  MEWS RR 0  MEWS LOC 1  MEWS Score 2  MEWS Score Color Yellow  Assess: if the MEWS score is Yellow or Red  Were vital signs accurate and taken at a resting state? Yes  Does the patient meet 2 or more of the SIRS criteria? No  MEWS guidelines implemented  No, previously yellow, continue vital signs every 4 hours  Notify: Charge Nurse/RN  Name of Charge Nurse/RN Notified John, RN  Provider Notification  Provider Name/Title Dr. Franchot  Date Provider Notified 12/06/23  Time Provider Notified 1225  Method of Notification Call  Notification Reason Critical Result (BP trending down and patient sleeping)  Provider response See new orders (500cc bolus and x2 more units of blood)  Date of Provider Response 12/06/23  Time of Provider Response 1235 (rounded)  Assess: SIRS CRITERIA  SIRS Temperature  0  SIRS Respirations  0  SIRS Pulse 1  SIRS WBC 0  SIRS Score Sum  1
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
## 2023-12-06 NOTE — Progress Notes (Signed)
 Patient refused lab draw to recheck hemoglobin.

## 2023-12-06 NOTE — Plan of Care (Signed)
  Problem: Clinical Measurements: Goal: Ability to maintain clinical measurements within normal limits will improve 12/06/2023 0630 by Jaun Mater, Mylinda, RN Outcome: Progressing 12/06/2023 0630 by Jaun Mater, Mylinda, RN Outcome: Progressing Goal: Will remain free from infection 12/06/2023 0630 by Jaun Mater, Mylinda, RN Outcome: Progressing 12/06/2023 0630 by Jaun Mater, Mylinda, RN Outcome: Progressing Goal: Respiratory complications will improve 12/06/2023 0630 by Jaun Mater, Mylinda, RN Outcome: Progressing 12/06/2023 0630 by Jaun Mater, Mylinda, RN Outcome: Progressing Goal: Cardiovascular complication will be avoided Outcome: Progressing   Problem: Pain Managment: Goal: General experience of comfort will improve and/or be controlled 12/06/2023 0630 by Jaun Mater, Mylinda, RN Outcome: Progressing 12/06/2023 0630 by Jaun Mater, Mylinda, RN Outcome: Progressing   Problem: Safety: Goal: Ability to remain free from injury will improve 12/06/2023 0630 by Jaun Mater, Mylinda, RN Outcome: Progressing 12/06/2023 0630 by Jaun Mater Mylinda, RN Outcome: Progressing

## 2023-12-06 NOTE — Progress Notes (Signed)
 PT Cancellation Note  Patient Details Name: Gary [name] MRN: [id number] DOB: 04-26-1955   Cancelled Treatment:    Reason Eval/Treat Not Completed: Beat[place] to MD who gave ok for patient to participate with PT services.  Patient declined to participate with PT this date despite gentle encouragement.  Will attempt to see pt at a future date/time as medically appropriate.   CHARM Glendia Bertin PT, DPT 12/06/23, 2:09 PM

## 2023-12-07 ENCOUNTER — Encounter: Payer: Self-pay | Admitting: Gastroenterology

## 2023-12-07 ENCOUNTER — Encounter
Admission: EM | Disposition: A | Discharge status: Skilled Nursing Facility | Payer: Self-pay | Source: Skilled Nursing Facility | Attending: Student

## 2023-12-07 DIAGNOSIS — K5791 Diverticulosis of intestine, part unspecified, without perforation or abscess with bleeding: Secondary | ICD-10-CM | POA: Diagnosis not present

## 2023-12-07 DIAGNOSIS — J9622 Acute and chronic respiratory failure with hypercapnia: Secondary | ICD-10-CM | POA: Diagnosis not present

## 2023-12-07 DIAGNOSIS — J9611 Chronic respiratory failure with hypoxia: Secondary | ICD-10-CM | POA: Diagnosis not present

## 2023-12-07 DIAGNOSIS — J9621 Acute and chronic respiratory failure with hypoxia: Secondary | ICD-10-CM | POA: Diagnosis not present

## 2023-12-07 DIAGNOSIS — G4733 Obstructive sleep apnea (adult) (pediatric): Secondary | ICD-10-CM | POA: Diagnosis not present

## 2023-12-07 DIAGNOSIS — J9612 Chronic respiratory failure with hypercapnia: Secondary | ICD-10-CM | POA: Diagnosis not present

## 2023-12-07 HISTORY — PX: FLEXIBLE SIGMOIDOSCOPY: SHX5431

## 2023-12-07 LAB — BASIC METABOLIC PANEL WITH GFR
Anion gap: 9 (ref 5–15)
BUN: 31 mg/dL — ABNORMAL HIGH (ref 8–23)
CO2: 40 mmol/L — ABNORMAL HIGH (ref 22–32)
Calcium: 7.2 mg/dL — ABNORMAL LOW (ref 8.9–10.3)
Chloride: 94 mmol/L — ABNORMAL LOW (ref 98–111)
Creatinine, Ser: 1.39 mg/dL — ABNORMAL HIGH (ref 0.61–1.24)
GFR, Estimated: 56 mL/min — ABNORMAL LOW (ref >60)
Glucose, Bld: 71 mg/dL (ref 70–99)
Potassium: 3 mmol/L — ABNORMAL LOW (ref 3.5–5.1)
Sodium: 143 mmol/L (ref 135–145)

## 2023-12-07 LAB — HEMOGLOBIN AND HEMATOCRIT, BLOOD
HCT: 30.4 % — ABNORMAL LOW (ref 39.0–52.0)
HCT: 29.1 % — ABNORMAL LOW (ref 39.0–52.0)
HCT: 27.7 % — ABNORMAL LOW (ref 39.0–52.0)
HCT: 25.7 % — ABNORMAL LOW (ref 39.0–52.0)
Hemoglobin: 8.7 g/dL — ABNORMAL LOW (ref 13.0–17.0)
Hemoglobin: 8.3 g/dL — ABNORMAL LOW (ref 13.0–17.0)
Hemoglobin: 7.6 g/dL — ABNORMAL LOW (ref 13.0–17.0)
Hemoglobin: 7.4 g/dL — ABNORMAL LOW (ref 13.0–17.0)

## 2023-12-07 SURGERY — SIGMOIDOSCOPY, FLEXIBLE

## 2023-12-07 MED ORDER — POTASSIUM CHLORIDE CRYS ER 20 MEQ PO TBCR
40.0000 meq | EXTENDED_RELEASE_TABLET | ORAL | Status: DC
  Administered 2023-12-07: 40 meq via ORAL
  Filled 2023-12-07: qty 2

## 2023-12-07 MED ORDER — SODIUM CHLORIDE 0.9 % IV SOLN: INTRAVENOUS | Status: DC

## 2023-12-07 MED ORDER — POTASSIUM CHLORIDE CRYS ER 20 MEQ PO TBCR
40.0000 meq | EXTENDED_RELEASE_TABLET | Freq: Once | ORAL | Status: AC
  Administered 2023-12-07: 40 meq via ORAL
  Filled 2023-12-07: qty 2

## 2023-12-07 NOTE — Progress Notes (Signed)
 [facility] Progress Note  Patient: Gary [name]    FMW:[id number]  DOA: 11/28/2023     Date of Service: the patient was seen and examined on 12/07/2023  Chief Complaint  Patient presents with   Shortness of Breath   Brief hospital course: Taken from prior notes   Gary [name] is a 68 y.o. male with medical history significant of heart failure preserved ejection fraction, hx of  DVT s/p treatment , AAA, COPD, hypertension, anemia, chronic hypoxic and hypercapnic respiratory failure on chronic trach (Shiley XLT #7 ) on [place] 5-6L during the day and bipap at bedtime. Patient  presents to ED  from  rehab facility in [place]. EMS was called due sat in 70's . Per notes patient uses Bipap but facility does not provide this therapy.    Patient has frequent multiple hospitalization most recent from 10/11/2023-10/26/23 with diagnosis of acute on chronic hypoxic and hypercapnic respiratory failure,sepsis secondary to Pseudomonas  Pneumonia complicated by  Pseudomonas bacteremia in addition to AKI on CKDIIIa.  Of note s/p hospitalization patient was discharged to [facility] and thereafter he was discharged to SNF.    Patient was found to be with acute on chronic hypoxic and hypercapnic respiratory failure and was placed on BiPAP.  Initial chest x-ray concerning for pulmonary vascular congestion so he was given a dose of IV Lasix . CT chest with worsening atelectatic changes in right upper lobe, and no other focal abnormality.  Labs with worsening anemia with hemoglobin at 7.6-anemia panel ordered.  Respiratory viral panel negative, BNP normal at 59.  BUN 35 and creatinine at 1.52 with baseline around 1.2-1.3-not meeting the criteria for AKI.   Assessment and Plan:  #LGIB  Likely diverticular  Patient with multiple maroon colored stools. He has had this in the past. During hospitalization 04/2023 it was thought to be related to diverticular bleed vs SCAD. Because patient had recent colonoscopy  showing mod to severe colitis no further GI intervention was recommended at that time.   CTA of the abd was performed without evidence of extravasation.  Patient's blood pressure remained soft.  He received an additional 2 units overnight.  He has had minimal bowel's overnight however. 8/22  CTA of the abdomen was performed again with no evidence of extravasation.  No evidence of shock, lactate within normal limits.   Tagged red cell scan was negative for active GI bleeding  Coag studies WNL  PCCM and GI consulted appreciate assistance.  Continue to trend H&H Flex sig w/ GI this AM  Transfuse for Hgb<7  # Acute on chronic respiratory failure with hypoxia and hypercapnia.  #Likely OHS #metabolic alkalosis compensatory for Hypercapnic RF  Likely secondary to not using BiPAP as recommended.  Patient has a chronic trach and uses 6 L of oxygen  at baseline.  - Improved back to baseline now Mildly elevated procalcitonin s/p 5-day course of Levaquin  -Continue with supplemental oxygen  during the day  -Continue with BiPAP at night and while sleeping -Continue with supportive care   # Acute metabolic encephalopathy: Resolved Secondary to hypercapnia. - Continue BiPAP  # COPD exacerbation:  -Continue with steroid taper - Continue with bronchodilator and supportive care   # Acute on chronic HFpEF  Remains volume overloaded.  Renal function improved this a.m. with holding diuresis.  Hold spironolactone , wean midodrine  as able.  Will likely need diuresis  Unna boots ordered.    CKD stage 3a, GFR 45-59 ml/min ([name]) History of CKD stage III A. Serum creatinine improved  w/ holding diuresis.       Latest Ref Rng & Units 12/07/2023    6:12 AM 12/06/2023    3:04 AM 12/05/2023    8:29 AM  BMP  Glucose 70 - 99 mg/dL 71  83  85   BUN 8 - 23 mg/dL 31  46  50   Creatinine 0.61 - 1.24 mg/dL 8.60  8.46  8.30   Sodium 135 - 145 mmol/L 143  143  145   Potassium 3.5 - 5.1 mmol/L 3.0  3.4  3.5    Chloride 98 - 111 mmol/L 94  93  90   CO2 22 - 32 mmol/L 40  37  40   Calcium  8.9 - 10.3 mg/dL 7.2  7.2  7.7   Continue to monitor   Hypokalemia  Replaced.   Chronic colitis - Continue home mesalamine , flex sig with GI    Normocytic anemia    Latest Ref Rng & Units 12/07/2023    4:20 PM 12/07/2023    9:27 AM 12/07/2023    6:12 AM  CBC  Hemoglobin 13.0 - 17.0 g/dL 7.6  8.3  8.7   Hematocrit 39.0 - 52.0 % 27.7  29.1  30.4     Hemoglobin stable, GIB per above.  Anemia panel consistent with anemia of chronic disease, iron  deficiency. B12 of 380  Iron /TIBC/Ferritin/ %Sat    Component Value Date/Time   IRON  19 (L) 11/29/2023 0137   TIBC 305 11/29/2023 0137   FERRITIN 10 (L) 11/28/2023 1825   IRONPCTSAT 6 (L) 11/29/2023 0137   History of prior GI bleed. -s/p IV iron  x 1 dose -Maintain B12 above 400 -Continue oral Fe supplementation  -Monitor hemoglobin per above  -Transfuse hgb <7   Sleep apnea OHS - Continue BiPAP at night   Morbid obesity ([name]) Estimated body mass index is 53 kg/m as calculated from the following:   Height as of this encounter: 6' 4 (1.93 m).   Weight as of this encounter: 197.5 kg.  - This will complicate overall prognosis   Hypotension Resolving  Blood pressure improve.  -Continue home midodrine  -Continue   Anxiety, Continue Atarax   Foley catheter was discontinued on 8/17, patient is voiding well.   Diet: Soft per GI DVT Prophylaxis: Subcutaneous Heparin     Advance goals of care discussion: Full code  Family Communication: family was not present at bedside, at the time of interview.  The pt provided permission to discuss medical plan with the family. Opportunity was given to ask question and all questions were answered satisfactorily.   Disposition:  Pt is from SNF, admitted with resp failure and volume overload and now LGIB, still has significant edema IV lasix  on hold due to hypotension. Continues to bleed. Plan for flex sig   Discharge to SNF, when stable.  Subjective: Had some bowel movements. No chest pain.    Physical Exam:  Constitutional: In no distress.  Trach in place capped  Cardiovascular: Normal rate, regular rhythm. 2+ Bilateral lower extremity edema  Pulmonary: Non labored breathing [place], no wheezing or rales.   Abdominal: Soft. Non distended and non tender Musculoskeletal: Normal range of motion.     Neurological: Alert and oriented to person, place, and time. Non focal  Skin: Skin is warm and dry.      Vitals:   12/07/23 1213 12/07/23 1223 12/07/23 1251 12/07/23 1600  BP: 96/66 104/69 119/80   Pulse: 86 87 85 90  Resp:  (!) 26 20 (!) 23  Temp:  ROLLEN)  96.2 F (35.7 C) 97.8 F (36.6 C)   TempSrc:  Tympanic Oral   SpO2: 96% 95% 99% 98%  Weight:      Height:        Intake/Output Summary (Last 24 hours) at 12/07/2023 1730 Last data filed at 12/07/2023 1419 Gross per 24 hour  Intake 888.75 ml  Output 1100 ml  Net -211.25 ml   Filed Weights   11/28/23 1347 12/07/23 1131  Weight: (!) 197.5 kg 89.6 kg    Data Reviewed:   CBC: Recent Labs  Lab 12/04/23 0836 12/05/23 0829 12/05/23 1644 12/05/23 2153 12/06/23 0304 12/06/23 1032 12/06/23 1823 12/07/23 0612 12/07/23 0927 12/07/23 1620  WBC 9.7 10.7* 10.7* 12.3* 9.7  --   --   --   --   --   NEUTROABS  --   --   --  10.3*  --   --   --   --   --   --   HGB 8.1* 7.8* 7.6* 7.0* 7.6* 8.2* 8.9* 8.7* 8.3* 7.6*  HCT 33.8* 31.1* 29.3* 25.7* 27.0* 29.0* 30.8* 30.4* 29.1* 27.7*  MCV 83.7 80.8 80.3 79.1* 78.9*  --   --   --   --   --   PLT 181 186 182 171 166  --   --   --   --   --    Basic Metabolic Panel: Recent Labs  Lab 12/01/23 0747 12/02/23 0829 12/03/23 0943 12/04/23 0836 12/05/23 0829 12/06/23 0304 12/07/23 0612  NA 143 144 144 144 145 143 143  K 3.5 3.9 3.8 3.9 3.5 3.4* 3.0*  CL 98 95* 94* 91* 90* 93* 94*  CO2 36* 38* 40* 42* 40* 37* 40*  GLUCOSE 117* 99 114* 79 85 83 71  BUN 36* 41* 48* 52* 50* 46* 31*   CREATININE 1.50* 1.62* 1.89* 1.90* 1.69* 1.53* 1.39*  CALCIUM  7.8* 7.9* 7.6* 7.6* 7.7* 7.2* 7.2*  MG 1.8 1.9 2.0 2.0  --  2.1  --   PHOS 2.2* 3.6 4.3 4.1  --   --   --     Studies: NM GI Blood Loss Result Date: 12/07/2023 CLINICAL DATA:  GI bleeding. EXAM: NUCLEAR MEDICINE GASTROINTESTINAL BLEEDING SCAN TECHNIQUE: Sequential abdominal images were obtained following intravenous administration of Tc-26m labeled red blood cells. Scanning was continued for 2 hours. RADIOPHARMACEUTICALS:  22.5 mCi Tc-30m pertechnetate in-vitro labeled red cells. COMPARISON:  None Available. FINDINGS: Normal distribution of blood pool activity is seen in the abdomen and pelvis. Blood pool activity is also seen within both flanks, likely within the abdominal wall soft tissues. Small amount of activity is also seen accumulating within the bladder and genitals. No extravasation of radiopharmaceutical activity is seen within the GI tract. IMPRESSION: Negative.  No evidence of active GI bleeding. Electronically Signed   By: Norleen DELENA Kil M.D.   On: 12/07/2023 09:22    Scheduled Meds:  sodium chloride    Intravenous Once   sodium chloride    Intravenous Once   sodium chloride    Intravenous Once   sodium chloride    Intravenous Once   escitalopram   20 mg Oral Daily   Fe Fum-Vit C-Vit B12-FA  1 capsule Oral BID   fluticasone  furoate-vilanterol  1 puff Inhalation Daily   guaiFENesin   600 mg Oral BID   hydrOXYzine   25 mg Oral TID   ipratropium-albuterol   3 mL Nebulization BID   mesalamine   1.2 g Oral BID   midodrine   5 mg Oral TID WC  pantoprazole  (PROTONIX ) IV  40 mg Intravenous Q12H   predniSONE   20 mg Oral Q breakfast   pregabalin   50 mg Oral Daily   Continuous Infusions:   PRN Meds: acetaminophen  **OR** acetaminophen , albuterol , artificial tears, ondansetron  **OR** ondansetron  (ZOFRAN ) IV, oxyCODONE , technetium labeled red blood cells, traZODone   Time spent: 35 minutes  Author: Alban Pepper. MD [facility] 12/07/2023 5:30 PM  To reach On-call, see care teams to locate the attending and reach out to them via [email or web address]. If 7PM-7AM, please contact night-coverage If you still have difficulty reaching the attending provider, please page the [facility] (Director on Call) for [facility] on amion for assistance.
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
## 2023-12-07 NOTE — Progress Notes (Signed)
 Notified Dr. Franchot and Dr. Therisa that patient has had two large maroon BMs with clots this morning and that patient gets dizzy with each BM. Dr. Franchot acknowledged. Dr. Therisa viewed secure chat but did not reply. Patient's vitals signs are stable at this time. No new orders.

## 2023-12-07 NOTE — Plan of Care (Signed)
  Problem: Clinical Measurements: Goal: Will remain free from infection Outcome: Progressing Goal: Cardiovascular complication will be avoided Outcome: Progressing   Problem: Elimination: Goal: Will not experience complications related to bowel motility Outcome: Progressing Goal: Will not experience complications related to urinary retention Outcome: Progressing   Problem: Skin Integrity: Goal: Risk for impaired skin integrity will decrease Outcome: Progressing   Problem: Respiratory: Goal: Ability to maintain a clear airway will improve Outcome: Progressing Goal: Ability to maintain adequate ventilation will improve Outcome: Progressing

## 2023-12-07 NOTE — Plan of Care (Signed)
  Problem: Education: Goal: Knowledge of General Education information will improve Description: Including pain rating scale, medication(s)/side effects and non-pharmacologic comfort measures Outcome: Progressing   Problem: Health Behavior/Discharge Planning: Goal: Ability to manage health-related needs will improve Outcome: Progressing   Problem: Clinical Measurements: Goal: Ability to maintain clinical measurements within normal limits will improve Outcome: Progressing Goal: Will remain free from infection Outcome: Progressing Goal: Diagnostic test results will improve Outcome: Progressing Goal: Respiratory complications will improve Outcome: Progressing Goal: Cardiovascular complication will be avoided Outcome: Progressing   Problem: Activity: Goal: Risk for activity intolerance will decrease Outcome: Progressing   Problem: Nutrition: Goal: Adequate nutrition will be maintained Outcome: Progressing   Problem: Coping: Goal: Level of anxiety will decrease Outcome: Progressing   Problem: Elimination: Goal: Will not experience complications related to bowel motility Outcome: Progressing Goal: Will not experience complications related to urinary retention Outcome: Progressing   Problem: Pain Managment: Goal: General experience of comfort will improve and/or be controlled Outcome: Progressing   Problem: Safety: Goal: Ability to remain free from injury will improve Outcome: Progressing   Problem: Skin Integrity: Goal: Risk for impaired skin integrity will decrease Outcome: Progressing   Problem: Education: Goal: Knowledge of disease or condition will improve Outcome: Progressing Goal: Knowledge of the prescribed therapeutic regimen will improve Outcome: Progressing Goal: Individualized Educational Video(s) Outcome: Progressing   Problem: Activity: Goal: Ability to tolerate increased activity will improve Outcome: Progressing Goal: Will verbalize the  importance of balancing activity with adequate rest periods Outcome: Progressing   Problem: Respiratory: Goal: Ability to maintain a clear airway will improve Outcome: Progressing Goal: Levels of oxygenation will improve Outcome: Progressing Goal: Ability to maintain adequate ventilation will improve Outcome: Progressing

## 2023-12-07 NOTE — Op Note (Signed)
 [facility] Gastroenterology Patient Name: Gary [name] Procedure Date: 12/07/2023 11:34 AM MRN: [id number] Account #: [id number] Date of Birth: 12/17/1955 Admit Type: Inpatient Age: 68 Room: [facility] ENDO ROOM 4 Gender: Male Note Status: Finalized Instrument Name: Colon Scope [id number] Procedure:             Flexible Sigmoidoscopy Indications:           Hematochezia Providers:             [name] MD, MD Medicines:             None Complications:         No immediate complications. Procedure:             Pre-Anesthesia Assessment:                        - Prior to the procedure, a History and Physical was                         performed, and patient medications, allergies and                         sensitivities were reviewed. The patient's tolerance                         of previous anesthesia was reviewed.                        - The risks and benefits of the procedure and the                         sedation options and risks were discussed with the                         patient. All questions were answered and informed                         consent was obtained.                        - ASA Grade Assessment: III - A patient with severe                         systemic disease.                        After obtaining informed consent, the scope was passed                         under direct vision. The Colonoscope was introduced                         through the anus and advanced to the the left                         transverse colon. The flexible sigmoidoscopy was                         accomplished without difficulty. The patient tolerated  the procedure well. The quality of the bowel                         preparation was fair. Findings:      The perianal and digital rectal examinations were normal.      Solid stool was found in the transverse colon.      Red blood was found in the rectum, in the sigmoid colon and in  the       descending colon. over 1 L lavage perfromed and all old blood cleared       out- no fresh bleeding seen , numerous diverticui seen in sigmoid colon       non activbely bleeding      The exam was otherwise without abnormality. Impression:            - Preparation of the colon was fair.                        - Stool in the transverse colon.                        - Blood in the rectum, in the sigmoid colon and in the                         descending colon.                        - The examination was otherwise normal.                        - No specimens collected. Recommendation:        - Return patient to hospital ward for ongoing care.                        - No active bleeding at this time, likely a                         diverticular bleed. no active colitis seen over left                         colon , if has further active bleeding with brighr red                         blood will need tagged rbc scan repeated since                         diverticular bleed can be intermittent . Procedure Code(s):     --- Professional ---                        [phone], Sigmoidoscopy, flexible; diagnostic, including                         collection of specimen(s) by brushing or washing, when                         performed (separate procedure) Diagnosis Code(s):     --- Professional ---  K62.5, Hemorrhage of anus and rectum                        K92.2, Gastrointestinal hemorrhage, unspecified                        K92.1, Melena (includes Hematochezia) CPT copyright 2022 American Medical Association. All rights reserved. The codes documented in this report are preliminary and upon coder review may  be revised to meet current compliance requirements. [name], MD [name] MD, MD 12/07/2023 12:21:10 PM This report has been signed electronically. Number of Addenda: 0 Note Initiated On: 12/07/2023 11:34 AM Total Procedure Duration: 0 hours 28 minutes 47  seconds  Estimated Blood Loss:  Estimated blood loss: none.      [facility]

## 2023-12-08 DIAGNOSIS — K5791 Diverticulosis of intestine, part unspecified, without perforation or abscess with bleeding: Secondary | ICD-10-CM | POA: Diagnosis not present

## 2023-12-08 DIAGNOSIS — J9621 Acute and chronic respiratory failure with hypoxia: Secondary | ICD-10-CM | POA: Diagnosis not present

## 2023-12-08 DIAGNOSIS — J9622 Acute and chronic respiratory failure with hypercapnia: Secondary | ICD-10-CM | POA: Diagnosis not present

## 2023-12-08 LAB — BLOOD GAS, VENOUS
Acid-Base Excess: 13.4 mmol/L — ABNORMAL HIGH (ref 0.0–2.0)
Bicarbonate: 43.2 mmol/L — ABNORMAL HIGH (ref 20.0–28.0)
O2 Saturation: 82.8 %
Patient temperature: 37
pCO2, Ven: 82 mmHg (ref 44–60)
pH, Ven: 7.33 (ref 7.25–7.43)
pO2, Ven: 47 mmHg — ABNORMAL HIGH (ref 32–45)

## 2023-12-08 LAB — CBC
HCT: 25.7 % — ABNORMAL LOW (ref 39.0–52.0)
Hemoglobin: 7.2 g/dL — ABNORMAL LOW (ref 13.0–17.0)
MCH: 24.4 pg — ABNORMAL LOW (ref 26.0–34.0)
MCHC: 28 g/dL — ABNORMAL LOW (ref 30.0–36.0)
MCV: 87.1 fL (ref 80.0–100.0)
Platelets: 142 K/uL — ABNORMAL LOW (ref 150–400)
RBC: 2.95 MIL/uL — ABNORMAL LOW (ref 4.22–5.81)
RDW: 21.1 % — ABNORMAL HIGH (ref 11.5–15.5)
WBC: 10.5 K/uL (ref 4.0–10.5)
nRBC: 1.2 % — ABNORMAL HIGH (ref 0.0–0.2)

## 2023-12-08 LAB — BASIC METABOLIC PANEL WITH GFR
Anion gap: 8 (ref 5–15)
BUN: 35 mg/dL — ABNORMAL HIGH (ref 8–23)
CO2: 37 mmol/L — ABNORMAL HIGH (ref 22–32)
Calcium: 7.1 mg/dL — ABNORMAL LOW (ref 8.9–10.3)
Chloride: 96 mmol/L — ABNORMAL LOW (ref 98–111)
Creatinine, Ser: 1.6 mg/dL — ABNORMAL HIGH (ref 0.61–1.24)
GFR, Estimated: 47 mL/min — ABNORMAL LOW (ref >60)
Glucose, Bld: 91 mg/dL (ref 70–99)
Potassium: 3.7 mmol/L (ref 3.5–5.1)
Sodium: 141 mmol/L (ref 135–145)

## 2023-12-08 LAB — HEMOGLOBIN AND HEMATOCRIT, BLOOD
HCT: 26 % — ABNORMAL LOW (ref 39.0–52.0)
Hemoglobin: 7.3 g/dL — ABNORMAL LOW (ref 13.0–17.0)

## 2023-12-08 NOTE — Progress Notes (Signed)
 [facility] Progress Note  Patient: Gary [name]    FMW:[id number]  DOA: 11/28/2023     Date of Service: the patient was seen and examined on 12/08/2023  Chief Complaint  Patient presents with   Shortness of Breath   Brief hospital course: Taken from prior notes   SAVA PROBY is a 68 y.o. 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 Patient  presents to ED  from  rehab facility in [place]. EMS was called due sat in 70's . Per notes patient uses Bipap but facility does not provide this therapy.    Patient has frequent multiple hospitalization most recent from 10/11/2023-10/26/23 with diagnosis of acute on chronic hypoxic and hypercapnic respiratory failure,sepsis secondary to Pseudomonas  Pneumonia complicated by  Pseudomonas bacteremia in addition to AKI on CKDIIIa.  Of note s/p hospitalization patient was discharged to [facility] and thereafter he was discharged to SNF.    Patient was found to be with acute on chronic hypoxic and hypercapnic respiratory failure and was placed on BiPAP.  Initial chest x-ray concerning for pulmonary vascular congestion so he was given a dose of IV Lasix . CT chest with worsening atelectatic changes in right upper lobe, and no other focal abnormality.  Labs with worsening anemia with hemoglobin at 7.6-anemia panel ordered.  Respiratory viral panel negative, BNP normal at 59.  BUN 35 and creatinine at 1.52 with baseline around 1.2-1.3-not meeting the criteria for AKI.   Assessment and Plan:  #LGIB  Likely diverticular  Patient with multiple maroon colored stools. He has had this in the past. During hospitalization 04/2023 it was thought to be related to diverticular bleed vs SCAD. Because patient had recent colonoscopy  showing mod to severe colitis no further GI intervention was recommended at that time.   CTA of the abd was performed without evidence of extravasation.  Patient's blood pressure remained soft.  He received an additional 2 units overnight.  He has had minimal bowel's overnight however. 8/22  CTA of the abdomen was performed again with no evidence of extravasation.  No evidence of shock, lactate within normal limits.   Tagged red cell scan was negative for active GI bleeding  Coag studies WNL  8/23 Flex sig, stool in transverse colon, blood in descending and sigmoid colon as well as rectum. Recommended tagged red blood cell scan if further active bleeding with BRBPR.   8/24 One large dark bowel movement., hgb down 1 point but less frequent stools   Transfuse hgb <7 Trend H&H daily for now.   # Acute on chronic respiratory failure with hypoxia and hypercapnia Resolving   #Likely OHS #metabolic alkalosis compensatory for Hypercapnic RF  Likely secondary to not using BiPAP as recommended.  Patient has a chronic trach and uses 6 L of oxygen  at baseline.  - Improved back to baseline now Mildly elevated procalcitonin s/p 5-day course of Levaquin  -Continue with supplemental oxygen  during the day  -Continue with BiPAP at night and while sleeping -Continue with supportive care   # Acute metabolic encephalopathy: Resolved Secondary to hypercapnia. - Continue BiPAP  # COPD exacerbation:  -Continue with steroid taper - Continue with bronchodilator and supportive care   # Acute on chronic HFpEF  Remains volume overloaded.  Renal function slightly worsened. SABRA  Hold spironolactone , wean midodrine  as able.   Unna boots ordered Will need    CKD stage 3a, GFR 45-59 ml/min ([name]) History of CKD stage III A. Serum creatinine  worsened this AM likely needs diuresis BP remain soft.       Latest Ref Rng & Units 12/08/2023    3:29 AM 12/07/2023    6:12 AM 12/06/2023    3:04 AM  BMP  Glucose  70 - 99 mg/dL 91  71  83   BUN 8 - 23 mg/dL 35  31  46   Creatinine 0.61 - 1.24 mg/dL 8.39  8.60  8.46   Sodium 135 - 145 mmol/L 141  143  143   Potassium 3.5 - 5.1 mmol/L 3.7  3.0  3.4   Chloride 98 - 111 mmol/L 96  94  93   CO2 22 - 32 mmol/L 37  40  37   Calcium  8.9 - 10.3 mg/dL 7.1  7.2  7.2   Continue to monitor   Hypokalemia Resolved   Chronic colitis - Continue home mesalamine , flex sig with GI showed no active colitis of L sided colon   Normocytic anemia    Latest Ref Rng & Units 12/08/2023    9:51 AM 12/08/2023    3:29 AM 12/07/2023    9:30 PM  CBC  WBC 4.0 - 10.5 K/uL  10.5    Hemoglobin 13.0 - 17.0 g/dL 7.3  7.2  7.4   Hematocrit 39.0 - 52.0 % 26.0  25.7  25.7   Platelets 150 - 400 K/uL  142      Hemoglobin stable, GIB per above.  Anemia panel consistent with anemia of chronic disease, iron  deficiency. B12 of 380  Iron /TIBC/Ferritin/ %Sat    Component Value Date/Time   IRON  19 (L) 11/29/2023 0137   TIBC 305 11/29/2023 0137   FERRITIN 10 (L) 11/28/2023 1825   IRONPCTSAT 6 (L) 11/29/2023 0137   History of prior GI bleed. -s/p IV iron  x 1 dose -Maintain B12 above 400 -Continue oral Fe supplementation  -Monitor hemoglobin per above  -Transfuse hgb <7   Sleep apnea OHS - Continue BiPAP at night   Morbid obesity ([name]) Estimated body mass index is 53 kg/m as calculated from the following:   Height as of this encounter: 6' 4 (1.93 m).   Weight as of this encounter: 197.5 kg.  - This will complicate overall prognosis  Prolonged Qtc on tele. Continue to monitor.   Avoid qtc prolonging medications.   Hypotension Resolving  Blood pressure improved  -Continue home midodrine   Anxiety, Continue Atarax   Foley catheter was discontinued on 8/17, patient is voiding well.   Diet: Soft per GI DVT Prophylaxis: Subcutaneous Heparin     Advance goals of care discussion: Full code  Family Communication: None at bedside, his family friend would like an  update.   Disposition:  Pt is from SNF, admitted with resp failure and volume overload and now LGIB, still has significant edema IV lasix  on hold due to hypotension.   Discharge to SNF, when stable.  Subjective: Had some bowel movements. No chest pain.    Physical Exam:   Constitutional: In no distress.  Trach in place capped  Cardiovascular: Normal rate, regular rhythm. 2+ bilateral lower extremity edema  Pulmonary: Non labored breathing [place], no wheezing or rales.   Abdominal: Soft. Non distended and non tender Musculoskeletal: Normal range of motion.     Neurological: Alert and oriented to person, place, and time.  Non focal  Skin: Skin is warm and dry.     Vitals:   12/08/23 0400 12/08/23 0835 12/08/23 1144 12/08/23 1150  BP:  (!) 87/75  (!) 94/55  Pulse:  78 81   Resp:  20 19   Temp: 98.7 F (37.1 C) 98.2 F (36.8 C) 98.1 F (36.7 C)   TempSrc: Axillary Oral Oral   SpO2:  98% 93%   Weight:      Height:        Intake/Output Summary (Last 24 hours) at 12/08/2023 1243 Last data filed at 12/08/2023 1036 Gross per 24 hour  Intake 480 ml  Output 1350 ml  Net -870 ml   Filed Weights   11/28/23 1347 12/07/23 1131  Weight: (!) 197.5 kg 89.6 kg    Data Reviewed:   CBC: Recent Labs  Lab 12/05/23 0829 12/05/23 1644 12/05/23 2153 12/06/23 0304 12/06/23 1032 12/07/23 0927 12/07/23 1620 12/07/23 2130 12/08/23 0329 12/08/23 0951  WBC 10.7* 10.7* 12.3* 9.7  --   --   --   --  10.5  --   NEUTROABS  --   --  10.3*  --   --   --   --   --   --   --   HGB 7.8* 7.6* 7.0* 7.6*   < > 8.3* 7.6* 7.4* 7.2* 7.3*  HCT 31.1* 29.3* 25.7* 27.0*   < > 29.1* 27.7* 25.7* 25.7* 26.0*  MCV 80.8 80.3 79.1* 78.9*  --   --   --   --  87.1  --   PLT 186 182 171 166  --   --   --   --  142*  --    < > = values in this interval not displayed.   Basic Metabolic Panel: Recent Labs  Lab 12/02/23 0829 12/03/23 0943 12/04/23 0836 12/05/23 0829 12/06/23 0304 12/07/23 0612  12/08/23 0329  NA 144 144 144 145 143 143 141  K 3.9 3.8 3.9 3.5 3.4* 3.0* 3.7  CL 95* 94* 91* 90* 93* 94* 96*  CO2 38* 40* 42* 40* 37* 40* 37*  GLUCOSE 99 114* 79 85 83 71 91  BUN 41* 48* 52* 50* 46* 31* 35*  CREATININE 1.62* 1.89* 1.90* 1.69* 1.53* 1.39* 1.60*  CALCIUM  7.9* 7.6* 7.6* 7.7* 7.2* 7.2* 7.1*  MG 1.9 2.0 2.0  --  2.1  --   --   PHOS 3.6 4.3 4.1  --   --   --   --     Studies: No results found.   Scheduled Meds:  sodium chloride    Intravenous Once   sodium chloride    Intravenous Once   sodium chloride    Intravenous Once   sodium chloride    Intravenous Once   escitalopram   20 mg Oral Daily   Fe Fum-Vit C-Vit B12-FA  1 capsule Oral BID   fluticasone  furoate-vilanterol  1 puff Inhalation Daily   guaiFENesin   600 mg Oral BID   hydrOXYzine   25 mg Oral TID   ipratropium-albuterol   3 mL Nebulization BID   mesalamine   1.2 g Oral BID   midodrine   5 mg Oral TID WC   pantoprazole  (PROTONIX ) IV  40 mg Intravenous Q12H   predniSONE   20 mg Oral Q breakfast   pregabalin   50 mg Oral Daily   Continuous Infusions:   PRN Meds: acetaminophen  **OR** acetaminophen , albuterol , artificial tears, ondansetron  **OR** ondansetron  (ZOFRAN ) IV, oxyCODONE , technetium labeled red blood cells, traZODone   Time spent: 35 minutes  Author: Alban Pepper. MD [facility] 12/08/2023 12:43 PM  To reach On-call, see care teams to locate the attending and reach out to them via [email or web address]. If 7PM-7AM, please contact night-coverage If you still have difficulty reaching the attending provider, please page the [facility] (Director on Call) for [facility] on amion for assistance.

## 2023-12-08 NOTE — Progress Notes (Signed)
 Patient had large black tarry stool.  Primary nurse made aware.

## 2023-12-08 NOTE — Progress Notes (Signed)
 When RN walked into patients room to do a assessment at around 1945 on 8/23, patient refused to answer orientation questions and a neuro check stating to RN  why do you nurses always ask these questions every few hours, I'm not answering it.    Baseline neuro status refused by patient at this time, will continue to monitor.

## 2023-12-08 NOTE — Progress Notes (Addendum)
 Dr. Alban made aware of the venous blood gas results. Asked that we try and have patient wear BiPAP during naps. Will talk to the patient and make him aware. Pt agreed he would call nurse to place bipap before he naps.   Dr. Alban aware of patient's soft blood pressures, maps are good and patient is not symptomatic. No change in orders or plan of care at this time.

## 2023-12-09 DIAGNOSIS — J9621 Acute and chronic respiratory failure with hypoxia: Secondary | ICD-10-CM | POA: Diagnosis not present

## 2023-12-09 DIAGNOSIS — K5791 Diverticulosis of intestine, part unspecified, without perforation or abscess with bleeding: Secondary | ICD-10-CM | POA: Diagnosis not present

## 2023-12-09 DIAGNOSIS — J9622 Acute and chronic respiratory failure with hypercapnia: Secondary | ICD-10-CM | POA: Diagnosis not present

## 2023-12-09 LAB — BPAM RBC
Blood Product Expiration Date: 202509212359
Blood Product Expiration Date: 202509212359
Blood Product Expiration Date: 202509212359
Blood Product Expiration Date: 202509212359
Blood Product Expiration Date: 202509212359
Blood Product Expiration Date: 202509212359
Blood Product Expiration Date: 202509212359
ISSUE DATE / TIME: 202508211507
ISSUE DATE / TIME: 202508212121
ISSUE DATE / TIME: 202508220432
ISSUE DATE / TIME: 202508221158
ISSUE DATE / TIME: 202508221833
Unit Type and Rh: 5100
Unit Type and Rh: 5100
Unit Type and Rh: 5100
Unit Type and Rh: 5100
Unit Type and Rh: 5100
Unit Type and Rh: 5100
Unit Type and Rh: 5100

## 2023-12-09 LAB — PREPARE RBC (CROSSMATCH)

## 2023-12-09 LAB — TYPE AND SCREEN
ABO/RH(D): A NEG
Antibody Screen: NEGATIVE
Unit division: 0
Unit division: 0
Unit division: 0
Unit division: 0
Unit division: 0
Unit division: 0
Unit division: 0

## 2023-12-09 LAB — CBC
HCT: 24.4 % — ABNORMAL LOW (ref 39.0–52.0)
Hemoglobin: 6.6 g/dL — ABNORMAL LOW (ref 13.0–17.0)
MCH: 24.1 pg — ABNORMAL LOW (ref 26.0–34.0)
MCHC: 27 g/dL — ABNORMAL LOW (ref 30.0–36.0)
MCV: 89.1 fL (ref 80.0–100.0)
Platelets: 137 K/uL — ABNORMAL LOW (ref 150–400)
RBC: 2.74 MIL/uL — ABNORMAL LOW (ref 4.22–5.81)
RDW: 21.5 % — ABNORMAL HIGH (ref 11.5–15.5)
WBC: 11.6 K/uL — ABNORMAL HIGH (ref 4.0–10.5)
nRBC: 0.9 % — ABNORMAL HIGH (ref 0.0–0.2)

## 2023-12-09 LAB — BRAIN NATRIURETIC PEPTIDE: B Natriuretic Peptide: 61.4 pg/mL (ref 0.0–100.0)

## 2023-12-09 LAB — BASIC METABOLIC PANEL WITH GFR
Anion gap: 7 (ref 5–15)
BUN: 29 mg/dL — ABNORMAL HIGH (ref 8–23)
CO2: 38 mmol/L — ABNORMAL HIGH (ref 22–32)
Calcium: 7 mg/dL — ABNORMAL LOW (ref 8.9–10.3)
Chloride: 94 mmol/L — ABNORMAL LOW (ref 98–111)
Creatinine, Ser: 1.71 mg/dL — ABNORMAL HIGH (ref 0.61–1.24)
GFR, Estimated: 44 mL/min — ABNORMAL LOW (ref >60)
Glucose, Bld: 98 mg/dL (ref 70–99)
Potassium: 3.6 mmol/L (ref 3.5–5.1)
Sodium: 139 mmol/L (ref 135–145)

## 2023-12-09 MED ORDER — SODIUM CHLORIDE 0.9% IV SOLUTION: Freq: Once | INTRAVENOUS | Status: AC

## 2023-12-09 MED ORDER — FUROSEMIDE 10 MG/ML IJ SOLN
40.0000 mg | Freq: Once | INTRAMUSCULAR | Status: AC
  Administered 2023-12-09: 40 mg via INTRAVENOUS
  Filled 2023-12-09: qty 4

## 2023-12-09 NOTE — Progress Notes (Signed)
    Rogelia Copping, MD [facility]   [address], [address] [place], [place] [address] Phone: [phone] Fax : [phone]   Subjective: The patient had a drop in his hemoglobin and continues to report GI bleeding.  The patient had a flexible sigmoidoscopy yesterday with blood seen and the presumed diagnosis of diverticulosis bleeding.   Objective: Vital signs in last 24 hours: Vitals:   12/09/23 1027 12/09/23 1104 12/09/23 1119 12/09/23 1253  BP: (!) 96/58 101/60 (!) 107/49   Pulse: 70 84 85 78  Resp: 18 18 19 15   Temp: 98.9 F (37.2 C) 98.1 F (36.7 C) 98.1 F (36.7 C)   TempSrc:  Oral Oral   SpO2:  94% 93% 94%  Weight:      Height:       Weight change:   Intake/Output Summary (Last 24 hours) at 12/09/2023 1331 Last data filed at 12/09/2023 1253 Gross per 24 hour  Intake 1564.17 ml  Output --  Net 1564.17 ml     Exam: Heart:: Regular rate and rhythm or without murmur or extra heart sounds Lungs: normal and clear to auscultation and percussion Abdomen: soft, nontender, normal bowel sounds   Lab Results: @LABTEST2 @ Micro Results: No results found for this or any previous visit (from the past 240 hours). Studies/Results: No results found. Medications: I have reviewed the patient's current medications. Scheduled Meds:  sodium chloride    Intravenous Once   sodium chloride    Intravenous Once   sodium chloride    Intravenous Once   sodium chloride    Intravenous Once   escitalopram   20 mg Oral Daily   Fe Fum-Vit C-Vit B12-FA  1 capsule Oral BID   fluticasone  furoate-vilanterol  1 puff Inhalation Daily   guaiFENesin   600 mg Oral BID   hydrOXYzine   25 mg Oral TID   ipratropium-albuterol   3 mL Nebulization BID   mesalamine   1.2 g Oral BID   midodrine   5 mg Oral TID WC   pantoprazole  (PROTONIX ) IV  40 mg Intravenous Q12H   pregabalin   50 mg Oral Daily   Continuous Infusions: PRN Meds:.acetaminophen  **OR** acetaminophen , albuterol , artificial tears, ondansetron  **OR**  ondansetron  (ZOFRAN ) IV, oxyCODONE , technetium labeled red blood cells, traZODone    Assessment: Principal Problem:   Acute on chronic respiratory failure with hypoxia and hypercapnia ([name]) Active Problems:   Morbid obesity ([name])   Hypotension   Sleep apnea   Acute on chronic heart failure with preserved ejection fraction (HFpEF) ([name])   Gastrointestinal hemorrhage associated with intestinal diverticulosis   Blood loss anemia   Chronic colitis   Acute metabolic encephalopathy   Acute on chronic hypoxic respiratory failure ([name])   COPD exacerbation ([name])   CKD stage 3a, GFR 45-59 ml/min ([name])   Normocytic anemia    Plan: This patient has a lower GI bleed with flexible sigmoidoscopy showing blood in the colon.  The patient had a colonoscopy in May of last year.  The patient may need vascular surgery for embolization if the patient should continue to bleed.  No further endoscopic procedures are planned at this time.  Dr. Therisa will be taking over the service tomorrow.   LOS: 11 days   Rogelia Copping, MD.FACG 12/09/2023, 1:31 PM Pager [phone] 7am-5pm  Check AMION for 5pm -7am coverage and on weekends

## 2023-12-09 NOTE — Progress Notes (Signed)
 Physical Therapy Treatment Patient Details Name: ZAKARIAH URWIN MRN: [id number] DOB: 1956-04-02 Today's Date: 12/09/2023   History of Present Illness Pt is a 68 y.o. male with medical history significant of HFpEF, DVT, AAA, COPD, HTN, anemia, chronic hypoxic and hypercapnic respiratory failure on chronic trach on [place] 5-6L during the day and bipap at bedtime. Patient  presents to ED  from rehab facility in [place]. EMS was called due sat in 70's . Per notes patient uses Bipap but facility does not provide this therapy.  Patient was placed on NRB in the field with increase in sat to 89-90 %. Of note patient has interim history of hospitalization form 10/11/2023-10/26/23 with diagnosis of acute on chronic hypoxic and hypercapnic respiratory failure, sepsis secondary to Pseudomonas. MD assessment includes: acute on chronic respiratory failure with hypoxia and hypercapnia, acute metabolic encephalopathy, COPD exacerbation, acute on chronic heart failure with preserved ejection fraction, hypotension, and normocytic anemia.    PT Comments  Pt found with most recent Hgb 6.6 but with transfusion completed after that reading was taken.  Per nursing ok for pt to participate with PT services but with pt declining mobility this date. Pt agreed to below supine therex only.  Pt put forth good effort with below exercises with manual resistance provided as able and reported no adverse symptoms during the session.  Pt will benefit from continued PT services upon discharge to safely address deficits listed in patient problem list for decreased caregiver assistance and eventual return to PLOF.    If plan is discharge home, recommend the following: A lot of help with walking and/or transfers;A lot of help with bathing/dressing/bathroom;Assistance with cooking/housework;Direct supervision/assist for medications management;Assist for transportation;Help with stairs or ramp for entrance;Direct supervision/assist for financial  management   Can travel by private vehicle     No  Equipment Recommendations  Other (comment) (TBD at next venue of care)    Recommendations for Other Services       Precautions / Restrictions Precautions Precautions: Fall Restrictions Weight Bearing Restrictions Per Provider Order: No     Mobility  Bed Mobility               General bed mobility comments: NT, pt declined mobility this session    Transfers                        Ambulation/Gait                   Stairs             Wheelchair Mobility     Tilt Bed    Modified Rankin (Stroke Patients Only)       Balance                                            Communication Communication Communication: No apparent difficulties  Cognition Arousal: Alert Behavior During Therapy: WFL for tasks assessed/performed   PT - Cognitive impairments: No apparent impairments                         Following commands: Intact      Cueing Cueing Techniques: Verbal cues  Exercises Total Joint Exercises Ankle Circles/Pumps: Strengthening, Both, 5 reps, 10 reps (with manual resistance) Quad Sets: Strengthening, Both, 5 reps, 10 reps Gluteal Sets: Strengthening, Both,  5 reps, 10 reps Short Arc Quad: Strengthening, Both, 5 reps, 10 reps (with manual resistance) Heel Slides: Strengthening, Both, 5 reps, 10 reps Hip ABduction/ADduction: AROM, AAROM, Strengthening, Both, 10 reps, 5 reps Straight Leg Raises: AROM, AAROM, Strengthening, Both, 5 reps, 10 reps Other Exercises Other Exercises: supine leg press with manual resistance both x 10 each    General Comments        Pertinent Vitals/Pain Pain Assessment Pain Assessment: No/denies pain    Home Living                          Prior Function            PT Goals (current goals can now be found in the care plan section) Progress towards PT goals: PT to reassess next treatment     Frequency    Min 2X/week      PT Plan      Co-evaluation              AM-PAC PT 6 Clicks Mobility   Outcome Measure  Help needed turning from your back to your side while in a flat bed without using bedrails?: A Little Help needed moving from lying on your back to sitting on the side of a flat bed without using bedrails?: A Little Help needed moving to and from a bed to a chair (including a wheelchair)?: A Little Help needed standing up from a chair using your arms (e.g., wheelchair or bedside chair)?: A Little Help needed to walk in hospital room?: A Lot Help needed climbing 3-5 steps with a railing? : Total 6 Click Score: 15    End of Session Equipment Utilized During Treatment: Oxygen  Activity Tolerance: Patient tolerated treatment well Patient left: in bed;with call bell/phone within reach Nurse Communication: Mobility status PT Visit Diagnosis: Difficulty in walking, not elsewhere classified (R26.2);Muscle weakness (generalized) (M62.81);Pain     Time: 8397-8377 PT Time Calculation (min) (ACUTE ONLY): 20 min  Charges:    $Therapeutic Exercise: 8-22 mins PT General Charges $$ ACUTE PT VISIT: 1 Visit                    D. Glendia Bertin PT, DPT 12/09/23, 4:27 PM

## 2023-12-09 NOTE — TOC Progression Note (Signed)
 Transition of Care [facility]) - Progression Note    Patient Details  Name: Gary [name] MRN: [id number] Date of Birth: 1956/02/18  Transition of Care [facility]) CM/SW Contact  [name], LCSW Phone Number: 12/09/2023, 12:29 PM  Clinical Narrative:  Per respiratory, patient has been using NIV. They need to ensure that patient is stable on bipap for at least a day or two. If not, they will have to rescind their bed offers. Sent secure chat to MD and RN to see if we can start this tonight.   Expected Discharge Plan: Skilled Nursing Facility Barriers to Discharge: Continued Medical Work up               Expected Discharge Plan and Services     Post Acute Care Choice: Skilled Nursing Facility Living arrangements for the past 2 months: Skilled Nursing Facility [facility])                                       Social Drivers of Health (SDOH) Interventions SDOH Screenings   Food Insecurity: No Food Insecurity (11/30/2023)  Housing: High Risk (11/30/2023)  Transportation Needs: No Transportation Needs (11/30/2023)  Utilities: Not At Risk (11/30/2023)  Depression (PHQ2-9): Low Risk  (06/07/2021)  Financial Resource Strain: High Risk (11/21/2022)   Received from [facility]  Physical Activity: Insufficiently Active (05/03/2017)  Social Connections: Moderately Isolated (11/30/2023)  Stress: Stress Concern Present (11/20/2022)   Received from [facility]  Tobacco Use: Medium Risk (11/30/2023)    Readmission Risk Interventions    03/19/2023    9:58 AM 11/05/2022    2:59 PM 11/05/2022   12:16 PM  Readmission Risk Prevention Plan  Transportation Screening Complete Complete Complete  Medication Review [employer]) Complete Complete Complete  PCP or Specialist appointment within 3-5 days of discharge Complete Complete Complete  HRI or Home Care Consult Complete Complete Complete  SW Recovery Care/Counseling Consult Complete Complete Complete  Palliative Care Screening  Complete Complete Complete  Skilled Nursing Facility Not Applicable Not Applicable Not Applicable

## 2023-12-09 NOTE — Progress Notes (Signed)
 [facility] Progress [facility]  Patient: Gary [name]    FMW:[id number]  DOA: 11/28/2023     Date of Service: the patient was seen and examined on 12/09/2023  Chief Complaint  Patient presents with   Shortness of Breath   Brief hospital course: Taken from prior notes   Gary [name] is a 68 y.o. 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 Patient  presents to ED  from  rehab facility in [place]. EMS was called due sat in 70's . Per notes patient uses Bipap but facility does not provide this therapy.    Patient has frequent multiple hospitalization most recent from 10/11/2023-10/26/23 with diagnosis of acute on chronic hypoxic and hypercapnic respiratory failure,sepsis secondary to Pseudomonas  Pneumonia complicated by  Pseudomonas bacteremia in addition to AKI on CKDIIIa.  Of note s/p hospitalization patient was discharged to [facility] and thereafter he was discharged to SNF.    Patient was found to be with acute on chronic hypoxic and hypercapnic respiratory failure and was placed on BiPAP.  Initial chest x-ray concerning for pulmonary vascular congestion so he was given a dose of IV Lasix . CT chest with worsening atelectatic changes in right upper lobe, and no other focal abnormality.  Labs with worsening anemia with hemoglobin at 7.6-anemia panel ordered.  Respiratory viral panel negative, BNP normal at 59.  BUN 35 and creatinine at 1.52 with baseline around 1.2-1.3-not meeting the criteria for AKI.   Assessment and Plan:  #LGIB  Likely diverticular  Patient with multiple maroon colored stools. He has had this in the past. During hospitalization 04/2023 it was thought to be related to diverticular bleed vs SCAD. Because patient had recent colonoscopy  showing mod to severe colitis no further GI intervention was recommended at that time.   CTA of the abd was performed without evidence of extravasation.  Patient's blood pressure remained soft.  He received an additional 2 units overnight.  He has had minimal bowel's overnight however. 8/22  CTA of the abdomen was performed again with no evidence of extravasation.  No evidence of shock, lactate within normal limits.   Tagged red cell scan was negative for active GI bleeding  Coag studies WNL  8/23 Flex sig, stool in transverse colon, blood in descending and sigmoid colon as well as rectum. Recommended tagged red blood cell scan if further active bleeding with BRBPR.   8/24 One large dark bowel movement., hgb down 1 point but less frequent stools  8/25 Hgb down to 6.6 in the AM, transfused one unit PRBcs. Blood pressure remains soft.    Consider tagged red scan in the AM if hgb does not improve.  Transfuse hgb <7 Trend H&H daily for now.   # Acute on chronic respiratory failure with hypoxia and hypercapnia Resolving   #Likely OHS #metabolic alkalosis compensatory for Hypercapnic RF  Likely secondary to not using BiPAP as recommended.  Patient has a chronic trach and uses 6 L of oxygen  at baseline.  - Improved back to baseline now Mildly elevated procalcitonin s/p 5-day course of Levaquin  -Continue with supplemental oxygen  during the day  -Continue with BiPAP at night and while sleeping -Continue with supportive care   # Acute metabolic encephalopathy: Resolved Secondary to hypercapnia. - Continue BiPAP  # COPD  exacerbation:  -s/p steroid taper  - Continue with bronchodilator and supportive care   # Acute on chronic HFpEF  Remains volume overloaded.  Renal function slightly worsened. .  Hold spironolactone , wean midodrine  as able.   Unna boots ordered    CKD stage 3a, GFR 45-59 ml/min ([name]) History of CKD stage III A. Serum creatinine  worsened this AM likely needs  diuresis BP remain soft.       Latest Ref Rng & Units 12/09/2023    1:46 AM 12/08/2023    3:29 AM 12/07/2023    6:12 AM  BMP  Glucose 70 - 99 mg/dL 98  91  71   BUN 8 - 23 mg/dL 29  35  31   Creatinine 0.61 - 1.24 mg/dL 8.28  8.39  8.60   Sodium 135 - 145 mmol/L 139  141  143   Potassium 3.5 - 5.1 mmol/L 3.6  3.7  3.0   Chloride 98 - 111 mmol/L 94  96  94   CO2 22 - 32 mmol/L 38  37  40   Calcium  8.9 - 10.3 mg/dL 7.0  7.1  7.2   Continue to monitor   Hypokalemia Resolved   H/o Chronic colitis - Continue home mesalamine , flex sig with GI showed no active colitis of L sided colon   Normocytic anemia    Latest Ref Rng & Units 12/09/2023    1:46 AM 12/08/2023    9:51 AM 12/08/2023    3:29 AM  CBC  WBC 4.0 - 10.5 K/uL 11.6   10.5   Hemoglobin 13.0 - 17.0 g/dL 6.6  7.3  7.2   Hematocrit 39.0 - 52.0 % 24.4  26.0  25.7   Platelets 150 - 400 K/uL 137   142     Hemoglobin 6.6 decreased frequency and volume of bloody stools, GIB per above.  Anemia panel consistent with anemia of chronic disease, iron  deficiency. B12 of 380  Iron /TIBC/Ferritin/ %Sat    Component Value Date/Time   IRON  19 (L) 11/29/2023 0137   TIBC 305 11/29/2023 0137   FERRITIN 10 (L) 11/28/2023 1825   IRONPCTSAT 6 (L) 11/29/2023 0137   History of prior GI bleed. -s/p IV iron  x 1 dose -Maintain B12 above 400 -Continue oral Fe supplementation  -Monitor hemoglobin per above  -Tagged red blood cell scan if hgb not improved.  -Transfuse hgb <7   Sleep apnea OHS - Continue BiPAP at night   Morbid obesity ([name]) Estimated body mass index is 53 kg/m as calculated from the following:   Height as of this encounter: 6' 4 (1.93 m).   Weight as of this encounter: 197.5 kg.  - This will complicate overall prognosis  Prolonged Qtc on tele. Continue to monitor.  Avoid qtc prolonging medications.   Hypotension Resolving  Blood pressure stable remains soft.  -Continue home midodrine   Anxiety, Continue  Atarax   Foley catheter was discontinued on 8/17, patient is voiding well.   Diet: Soft per GI DVT Prophylaxis: Subcutaneous Heparin     Advance goals of care discussion: Full code  Family Communication: None at bedside, his family friend would like an update.   Disposition:  Pt is from SNF, admitted with resp failure and volume overload and now LGIB, still has significant edema IV lasix  on hold due to hypotension.   Discharge to SNF, when stable.  Subjective: Had some bowel movements. No chest pain.    Physical Exam:   Constitutional: In no distress.  Trach in  place capped  Cardiovascular: Normal rate, regular rhythm. 2+ bilateral lower extremity edema  Pulmonary: Non labored breathing on [place], no wheezing or rales.   Abdominal: Soft. Non distended and non tender Musculoskeletal: Normal range of motion.     Neurological: Alert and oriented to person, place, and time. Non focal  Skin: Skin is warm and dry.      Vitals:   12/09/23 1600 12/09/23 1650 12/09/23 1700 12/09/23 1800  BP:  (!) 95/53    Pulse: 74 77 77 76  Resp: (!) 27 18    Temp:  98.4 F (36.9 C)    TempSrc:  Oral    SpO2: 98% 95% 96% 99%  Weight:      Height:        Intake/Output Summary (Last 24 hours) at 12/09/2023 1958 Last data filed at 12/09/2023 1941 Gross per 24 hour  Intake 2044.17 ml  Output 700 ml  Net 1344.17 ml   Filed Weights   11/28/23 1347 12/07/23 1131  Weight: (!) 197.5 kg 89.6 kg    Data Reviewed:   CBC: Recent Labs  Lab 12/05/23 1644 12/05/23 2153 12/06/23 0304 12/06/23 1032 12/07/23 1620 12/07/23 2130 12/08/23 0329 12/08/23 0951 12/09/23 0146  WBC 10.7* 12.3* 9.7  --   --   --  10.5  --  11.6*  NEUTROABS  --  10.3*  --   --   --   --   --   --   --   HGB 7.6* 7.0* 7.6*   < > 7.6* 7.4* 7.2* 7.3* 6.6*  HCT 29.3* 25.7* 27.0*   < > 27.7* 25.7* 25.7* 26.0* 24.4*  MCV 80.3 79.1* 78.9*  --   --   --  87.1  --  89.1  PLT 182 171 166  --   --   --  142*  --  137*   < > =  values in this interval not displayed.   Basic Metabolic Panel: Recent Labs  Lab 12/03/23 0943 12/04/23 0836 12/05/23 0829 12/06/23 0304 12/07/23 0612 12/08/23 0329 12/09/23 0146  NA 144 144 145 143 143 141 139  K 3.8 3.9 3.5 3.4* 3.0* 3.7 3.6  CL 94* 91* 90* 93* 94* 96* 94*  CO2 40* 42* 40* 37* 40* 37* 38*  GLUCOSE 114* 79 85 83 71 91 98  BUN 48* 52* 50* 46* 31* 35* 29*  CREATININE 1.89* 1.90* 1.69* 1.53* 1.39* 1.60* 1.71*  CALCIUM  7.6* 7.6* 7.7* 7.2* 7.2* 7.1* 7.0*  MG 2.0 2.0  --  2.1  --   --   --   PHOS 4.3 4.1  --   --   --   --   --     Studies: No results found.   Scheduled Meds:  sodium chloride    Intravenous Once   sodium chloride    Intravenous Once   sodium chloride    Intravenous Once   sodium chloride    Intravenous Once   escitalopram   20 mg Oral Daily   Fe Fum-Vit C-Vit B12-FA  1 capsule Oral BID   fluticasone  furoate-vilanterol  1 puff Inhalation Daily   guaiFENesin   600 mg Oral BID   hydrOXYzine   25 mg Oral TID   ipratropium-albuterol   3 mL Nebulization BID   mesalamine   1.2 g Oral BID   midodrine   5 mg Oral TID WC   pantoprazole  (PROTONIX ) IV  40 mg Intravenous Q12H   pregabalin   50 mg Oral Daily   Continuous Infusions:   PRN  Meds: acetaminophen  **OR** acetaminophen , albuterol , artificial tears, ondansetron  **OR** ondansetron  (ZOFRAN ) IV, oxyCODONE , technetium labeled red blood cells, traZODone   Time spent: 35 minutes  Author: Alban Pepper. MD [facility] 12/09/2023 7:58 PM  To reach On-call, see care teams to locate the attending and reach out to them via [email or web address]. If 7PM-7AM, please contact night-coverage If you still have difficulty reaching the attending provider, please page the [facility] (Director on Call) for [facility] on amion for assistance.

## 2023-12-10 ENCOUNTER — Inpatient Hospital Stay

## 2023-12-10 DIAGNOSIS — K529 Noninfective gastroenteritis and colitis, unspecified: Secondary | ICD-10-CM | POA: Diagnosis not present

## 2023-12-10 DIAGNOSIS — K5791 Diverticulosis of intestine, part unspecified, without perforation or abscess with bleeding: Secondary | ICD-10-CM | POA: Diagnosis not present

## 2023-12-10 DIAGNOSIS — J9622 Acute and chronic respiratory failure with hypercapnia: Secondary | ICD-10-CM | POA: Diagnosis not present

## 2023-12-10 DIAGNOSIS — J9621 Acute and chronic respiratory failure with hypoxia: Secondary | ICD-10-CM | POA: Diagnosis not present

## 2023-12-10 LAB — CBC
HCT: 28.6 % — ABNORMAL LOW (ref 39.0–52.0)
HCT: 28.9 % — ABNORMAL LOW (ref 39.0–52.0)
Hemoglobin: 7.9 g/dL — ABNORMAL LOW (ref 13.0–17.0)
Hemoglobin: 8.1 g/dL — ABNORMAL LOW (ref 13.0–17.0)
MCH: 24.7 pg — ABNORMAL LOW (ref 26.0–34.0)
MCH: 25.1 pg — ABNORMAL LOW (ref 26.0–34.0)
MCHC: 27.6 g/dL — ABNORMAL LOW (ref 30.0–36.0)
MCHC: 28 g/dL — ABNORMAL LOW (ref 30.0–36.0)
MCV: 89.4 fL (ref 80.0–100.0)
MCV: 89.5 fL (ref 80.0–100.0)
Platelets: 134 K/uL — ABNORMAL LOW (ref 150–400)
Platelets: 141 K/uL — ABNORMAL LOW (ref 150–400)
RBC: 3.2 MIL/uL — ABNORMAL LOW (ref 4.22–5.81)
RBC: 3.23 MIL/uL — ABNORMAL LOW (ref 4.22–5.81)
RDW: 21.1 % — ABNORMAL HIGH (ref 11.5–15.5)
RDW: 21 % — ABNORMAL HIGH (ref 11.5–15.5)
WBC: 9.7 K/uL (ref 4.0–10.5)
WBC: 10.1 K/uL (ref 4.0–10.5)
nRBC: 0.8 % — ABNORMAL HIGH (ref 0.0–0.2)
nRBC: 0.8 % — ABNORMAL HIGH (ref 0.0–0.2)

## 2023-12-10 LAB — TYPE AND SCREEN
ABO/RH(D): A NEG
Antibody Screen: NEGATIVE
Unit division: 0
Unit division: 0

## 2023-12-10 LAB — BPAM RBC
Blood Product Expiration Date: 202509212359
Blood Product Expiration Date: 202509212359
ISSUE DATE / TIME: 202508250815
ISSUE DATE / TIME: 202508251055
Unit Type and Rh: 5100
Unit Type and Rh: 5100

## 2023-12-10 LAB — BASIC METABOLIC PANEL WITH GFR
Anion gap: 6 (ref 5–15)
BUN: 27 mg/dL — ABNORMAL HIGH (ref 8–23)
CO2: 37 mmol/L — ABNORMAL HIGH (ref 22–32)
Calcium: 6.9 mg/dL — ABNORMAL LOW (ref 8.9–10.3)
Chloride: 98 mmol/L (ref 98–111)
Creatinine, Ser: 1.27 mg/dL — ABNORMAL HIGH (ref 0.61–1.24)
GFR, Estimated: >60 mL/min (ref >60)
Glucose, Bld: 89 mg/dL (ref 70–99)
Potassium: 3.3 mmol/L — ABNORMAL LOW (ref 3.5–5.1)
Sodium: 141 mmol/L (ref 135–145)

## 2023-12-10 LAB — MAGNESIUM: Magnesium: 2.3 mg/dL (ref 1.7–2.4)

## 2023-12-10 MED ORDER — POTASSIUM CHLORIDE CRYS ER 20 MEQ PO TBCR
40.0000 meq | EXTENDED_RELEASE_TABLET | ORAL | Status: AC
  Administered 2023-12-10 (×2): 40 meq via ORAL
  Filled 2023-12-10 (×2): qty 2

## 2023-12-10 MED ORDER — PANTOPRAZOLE SODIUM 40 MG PO TBEC
40.0000 mg | DELAYED_RELEASE_TABLET | Freq: Every day | ORAL | Status: DC
  Administered 2023-12-11 – 2023-12-21 (×11): 40 mg via ORAL
  Filled 2023-12-10 (×12): qty 1

## 2023-12-10 NOTE — Progress Notes (Signed)
 Gary [name] , MD [address], [place], [place], [address] Phone: [phone] Fax: [phone]   Gary [name] is being followed for GI bleed  Subjective: Says he still has bloody bowel movements.  Had one this morning denies any abdominal pain.   Objective: Vital signs in last 24 hours: Vitals:   12/09/23 2359 12/10/23 0351 12/10/23 0700 12/10/23 0741  BP: 97/60 114/69  97/62  Pulse: 71   74  Resp: 20     Temp: 98.4 F (36.9 C) 98 F (36.7 C) 98.7 F (37.1 C)   TempSrc:  Oral    SpO2: 100%   97%  Weight:      Height:       Weight change:   Intake/Output Summary (Last 24 hours) at 12/10/2023 0955 Last data filed at 12/10/2023 9357 Gross per 24 hour  Intake 2034.17 ml  Output 1300 ml  Net 734.17 ml     Exam:  Abdomen: soft, nontender, normal bowel sounds   Lab Results: @LABTEST2 @ Micro Results: No results found for this or any previous visit (from the past 240 hours). Studies/Results: No results found. Medications: I have reviewed the patient's current medications. Scheduled Meds:  sodium chloride    Intravenous Once   sodium chloride    Intravenous Once   sodium chloride    Intravenous Once   sodium chloride    Intravenous Once   escitalopram   20 mg Oral Daily   Fe Fum-Vit C-Vit B12-FA  1 capsule Oral BID   fluticasone  furoate-vilanterol  1 puff Inhalation Daily   guaiFENesin   600 mg Oral BID   hydrOXYzine   25 mg Oral TID   ipratropium-albuterol   3 mL Nebulization BID   mesalamine   1.2 g Oral BID   midodrine   5 mg Oral TID WC   pantoprazole  (PROTONIX ) IV  40 mg Intravenous Q12H   potassium chloride   40 mEq Oral Q4H   pregabalin   50 mg Oral Daily   Continuous Infusions: PRN Meds:.acetaminophen  **OR** acetaminophen , albuterol , artificial tears, ondansetron  **OR** ondansetron  (ZOFRAN ) IV, oxyCODONE , technetium labeled red blood cells, traZODone    Assessment: Principal Problem:   Acute on chronic respiratory failure with hypoxia and  hypercapnia ([name]) Active Problems:   Morbid obesity ([name])   Hypotension   Sleep apnea   Acute on chronic heart failure with preserved ejection fraction (HFpEF) ([name])   Gastrointestinal hemorrhage associated with intestinal diverticulosis   Blood loss anemia   Chronic colitis   Acute metabolic encephalopathy   Acute on chronic hypoxic respiratory failure ([name])   COPD exacerbation ([name])   CKD stage 3a, GFR 45-59 ml/min ([name])   Normocytic anemia   Gary [name] 68 y.o. male  with a history of prior lower GI bleed concerns for possible diverticulosis associated colitis since versus possible ulcerative colitis.  Prior right hemicolectomy.  Multiple comorbidities including AAA, AKI in the past CKD hypertension iron  deficiency anemia.  Follows with Dr. Nandigam at [place].  Has been started on mesalamine  in the past no subsequent outpatient follow-up.  Admitted with respiratory failure.  I had been consulted for multiple episodes of rectal bleeding to CT angiogram showed no acute bleeding but showed inflammation in the left side of the colon around the sigmoid colon which was also previously inflamed back in 2024 when the patient had a colonoscopy he presented similarly with a GI bleed.  I performed a flexible sigmoidoscopy without a bowel prep [patient did not want an enema] and saw a large quantity of blood in  the left colon could not get into the right colon due to the solid stool.  Once I washed all the blood no active bleeding was noted.Since the procedure had further episodes of bleeding.  Hemoglobin was stable at 7.4 g then dropped to 6.6 g and today to 7.9 g.  The patient has so far had 2 CT angiograms and 1 tagged RBC scan on the 21st of this month and the 23rd of this month.  I still suspect a diverticular bleed or bleeding at the anastomosis   Plan: Monitor CBC and transfuse as needed If the patient has overt signs of bleeding recommend repeat tagged RBC scan to locate the site of  possible bleed and if positive then  based on respiratory status , we should decide if we can perform an endoscopic intervention as he also has developed acute on chronic respiratory failure with hypoxia on this admission and is on antibiotics and would be very high risk for anesthesia.  The other issue will be if he can truly tolerate a bowel prep.  He was unable to do an enema for my flexible sigmoidoscopy.  If you do localize the site of bleeding on tagged RBC scan vascular embolization probably would be a better option in his case   LOS: 12 days   Gary Kung, MD 12/10/2023, 9:55 AM

## 2023-12-10 NOTE — Progress Notes (Signed)
 [facility] Progress Note  Patient: Gary [name]    FMW:[id number]  DOA: 11/28/2023     Date of Service: the patient was seen and examined on 12/10/2023  Chief Complaint  Patient presents with   Shortness of Breath   Brief hospital course: Taken from prior notes   Gary [name] is a 68 y.o. male with medical history significant of heart failure preserved ejection fraction, hx of  DVT s/p treatment , AAA, COPD, hypertension, anemia, chronic hypoxic and hypercapnic respiratory failure on chronic trach (Shiley XLT #7 ) on [place] 5-6L during the day and bipap at bedtime. Patient  presents to ED  from  rehab facility in [place]. EMS was called due sat in 70's . Per notes patient uses Bipap but facility does not provide this therapy.    Patient has frequent multiple hospitalization most recent from 10/11/2023-10/26/23 with diagnosis of acute on chronic hypoxic and hypercapnic respiratory failure,sepsis secondary to Pseudomonas  Pneumonia complicated by  Pseudomonas bacteremia in addition to AKI on CKDIIIa.  Of note s/p hospitalization patient was discharged to [facility] and thereafter he was discharged to SNF.    Patient was found to be with acute on chronic hypoxic and hypercapnic respiratory failure and was placed on BiPAP.  Initial chest x-ray concerning for pulmonary vascular congestion so he was given a dose of IV Lasix . CT chest with worsening atelectatic changes in right upper lobe, and no other focal abnormality.  Labs with worsening anemia with hemoglobin at 7.6-anemia panel ordered.  Respiratory viral panel negative, BNP normal at 59.  BUN 35 and creatinine at 1.52 with baseline around 1.2-1.3-not meeting the criteria for AKI.   Assessment and Plan:  #LGIB  Likely diverticular  Patient with multiple maroon colored stools. He has had this in the past. During hospitalization 04/2023 it was thought to be related to diverticular bleed vs SCAD. Because patient had recent colonoscopy  showing mod to severe colitis no further GI intervention was recommended at that time.   CTA of the abd was performed without evidence of extravasation.  Patient's blood pressure remained soft.  He received an additional 2 units overnight.  He has had minimal bowel's overnight however. 8/22  CTA of the abdomen was performed again with no evidence of extravasation.  No evidence of shock, lactate within normal limits.   Tagged red cell scan was negative for active GI bleeding  Coag studies WNL  8/23 Flex sig, stool in transverse colon, blood in descending and sigmoid colon as well as rectum. Recommended tagged red blood cell scan if further active bleeding with BRBPR.   8/24 One large dark bowel movement., hgb down 1 point but less frequent stools  8/25 Hgb down to 6.6 in the AM, transfused one unit PRBcs. Blood pressure remains soft.   8/26 hemoglobin responded to a unit of blood  Tagged red scan ordered 826 given continues to have bloody bowel movements although dark Transfuse hgb <7 Trend H&H daily for now.   # Acute on chronic respiratory failure with hypoxia and hypercapnia Resolving   #Likely OHS #metabolic alkalosis compensatory for Hypercapnic RF  Likely secondary to not using BiPAP as recommended.  Patient has a chronic trach and uses 6 L of oxygen  at baseline.  - Improved back to baseline now Mildly elevated procalcitonin s/p 5-day course of Levaquin  -Continue with supplemental oxygen  during the day  -Continue with BiPAP at night and while sleeping -Continue with supportive care   # Acute metabolic encephalopathy: Resolved  Secondary to hypercapnia. - Continue BiPAP  # COPD exacerbation:  -s/p steroid taper  - Continue with bronchodilator and supportive care   # Acute on chronic HFpEF  Remains volume overloaded.  Renal function slightly worsened. .  Hold spironolactone , wean midodrine  as able.   Unna boots ordered    CKD stage 3a, GFR 45-59 ml/min  ([name]) History of CKD stage III A. Improved this aM with diuresis      Latest Ref Rng & Units 12/10/2023    6:16 AM 12/09/2023    1:46 AM 12/08/2023    3:29 AM  BMP  Glucose 70 - 99 mg/dL 89  98  91   BUN 8 - 23 mg/dL 27  29  35   Creatinine 0.61 - 1.24 mg/dL 8.72  8.28  8.39   Sodium 135 - 145 mmol/L 141  139  141   Potassium 3.5 - 5.1 mmol/L 3.3  3.6  3.7   Chloride 98 - 111 mmol/L 98  94  96   CO2 22 - 32 mmol/L 37  38  37   Calcium  8.9 - 10.3 mg/dL 6.9  7.0  7.1   Continue to monitor   Hypokalemia Resolved   H/o Chronic colitis - Continue home mesalamine , flex sig with GI showed no active colitis of L sided colon   Normocytic anemia    Latest Ref Rng & Units 12/10/2023    6:16 AM 12/09/2023    1:46 AM 12/08/2023    9:51 AM  CBC  WBC 4.0 - 10.5 K/uL 9.7  11.6    Hemoglobin 13.0 - 17.0 g/dL 7.9  6.6  7.3   Hematocrit 39.0 - 52.0 % 28.6  24.4  26.0   Platelets 150 - 400 K/uL 134  137      Hemoglobin responded appropriately to 1 unit of packed red blood cells.  Anemia panel consistent with anemia of chronic disease, iron  deficiency. B12 of 380  Iron /TIBC/Ferritin/ %Sat    Component Value Date/Time   IRON  19 (L) 11/29/2023 0137   TIBC 305 11/29/2023 0137   FERRITIN 10 (L) 11/28/2023 1825   IRONPCTSAT 6 (L) 11/29/2023 0137   History of prior GI bleed. -s/p IV iron  x 1 dose -Maintain B12 above 400 -Continue oral Fe supplementation  -Monitor hemoglobin per above  -Tagged red blood cell scan if hgb not improved.  -Transfuse hgb <7   Sleep apnea OHS - Continue BiPAP at night   Morbid obesity ([name]) Estimated body mass index is 53 kg/m as calculated from the following:   Height as of this encounter: 6' 4 (1.93 m).   Weight as of this encounter: 197.5 kg.  - This will complicate overall prognosis  Prolonged Qtc on tele. Continue to monitor.  Avoid qtc prolonging medications.   Hypotension Resolving  Blood pressure stable remains soft.  -Continue home  midodrine   Anxiety, Continue Atarax   Foley catheter was discontinued on 8/17, patient is voiding well.   Diet: Soft per GI DVT Prophylaxis: Subcutaneous Heparin     Advance goals of care discussion: Full code  Family Communication: None at bedside  Disposition:  Pt is from SNF, admitted with resp failure and volume overload and now LGIB, still has significant edema IV lasix  on hold due to hypotension.   Discharge to SNF, when stable.  Subjective: Had some bowel movements that were bloody.  No other issues overnight.   Physical Exam:    Constitutional: In no distress.  Trach capped.  Cardiovascular: Normal  rate, regular rhythm. No lower extremity edema  Pulmonary: Non labored breathing on , no wheezing or rales.   Abdominal: Soft. Non distended and non tender Musculoskeletal: Normal range of motion.     Neurological: Alert and oriented to person, place, and time. Non focal  Skin: Skin is warm and dry.       Vitals:   12/10/23 0741 12/10/23 1100 12/10/23 1126 12/10/23 1300  BP: 97/62  (!) 94/57 111/61  Pulse: 74  76 78  Resp:   (!) 27 (!) 22  Temp:  98.7 F (37.1 C)    TempSrc:      SpO2: 97%  96% 94%  Weight:      Height:        Intake/Output Summary (Last 24 hours) at 12/10/2023 1829 Last data filed at 12/10/2023 1300 Gross per 24 hour  Intake 1680 ml  Output 1300 ml  Net 380 ml   Filed Weights   11/28/23 1347 12/07/23 1131  Weight: (!) 197.5 kg 89.6 kg    Data Reviewed:   CBC: Recent Labs  Lab 12/05/23 2153 12/06/23 0304 12/06/23 1032 12/07/23 2130 12/08/23 0329 12/08/23 0951 12/09/23 0146 12/10/23 0616  WBC 12.3* 9.7  --   --  10.5  --  11.6* 9.7  NEUTROABS 10.3*  --   --   --   --   --   --   --   HGB 7.0* 7.6*   < > 7.4* 7.2* 7.3* 6.6* 7.9*  HCT 25.7* 27.0*   < > 25.7* 25.7* 26.0* 24.4* 28.6*  MCV 79.1* 78.9*  --   --  87.1  --  89.1 89.4  PLT 171 166  --   --  142*  --  137* 134*   < > = values in this interval not displayed.    Basic Metabolic Panel: Recent Labs  Lab 12/04/23 0836 12/05/23 0829 12/06/23 0304 12/07/23 0612 12/08/23 0329 12/09/23 0146 12/10/23 0616  NA 144   < > 143 143 141 139 141  K 3.9   < > 3.4* 3.0* 3.7 3.6 3.3*  CL 91*   < > 93* 94* 96* 94* 98  CO2 42*   < > 37* 40* 37* 38* 37*  GLUCOSE 79   < > 83 71 91 98 89  BUN 52*   < > 46* 31* 35* 29* 27*  CREATININE 1.90*   < > 1.53* 1.39* 1.60* 1.71* 1.27*  CALCIUM  7.6*   < > 7.2* 7.2* 7.1* 7.0* 6.9*  MG 2.0  --  2.1  --   --   --  2.3  PHOS 4.1  --   --   --   --   --   --    < > = values in this interval not displayed.    Studies: No results found.   Scheduled Meds:  sodium chloride    Intravenous Once   sodium chloride    Intravenous Once   sodium chloride    Intravenous Once   sodium chloride    Intravenous Once   escitalopram   20 mg Oral Daily   Fe Fum-Vit C-Vit B12-FA  1 capsule Oral BID   fluticasone  furoate-vilanterol  1 puff Inhalation Daily   guaiFENesin   600 mg Oral BID   hydrOXYzine   25 mg Oral TID   ipratropium-albuterol   3 mL Nebulization BID   mesalamine   1.2 g Oral BID   midodrine   5 mg Oral TID WC   [START ON 12/11/2023] pantoprazole   40  mg Oral Daily   pregabalin   50 mg Oral Daily   Continuous Infusions:   PRN Meds: acetaminophen  **OR** acetaminophen , albuterol , artificial tears, ondansetron  **OR** ondansetron  (ZOFRAN ) IV, oxyCODONE , traZODone   Time spent: 35 minutes  Author: Alban Pepper. MD [facility] 12/10/2023 6:29 PM  To reach On-call, see care teams to locate the attending and reach out to them via [email or web address]. If 7PM-7AM, please contact night-coverage If you still have difficulty reaching the attending provider, please page the [facility] (Director on Call) for [facility] on amion for assistance.

## 2023-12-10 NOTE — Progress Notes (Signed)
 Patient having multiple black, loose stools this am. Pt is concerned about bleeding from my butt. Pt educated that there is no fresh blood, which would be bright red or maroon. Pt stated it was fresh blood. Multiple attempts at education provided to patient on black stools vs active GI bleed. Pt denies further needs. Took medication with no issue.

## 2023-12-10 NOTE — Plan of Care (Signed)
  Problem: Education: Goal: Knowledge of General Education information will improve Description: Including pain rating scale, medication(s)/side effects and non-pharmacologic comfort measures Outcome: Progressing   Problem: Health Behavior/Discharge Planning: Goal: Ability to manage health-related needs will improve Outcome: Progressing   Problem: Clinical Measurements: Goal: Will remain free from infection Outcome: Progressing Goal: Diagnostic test results will improve Outcome: Progressing Goal: Respiratory complications will improve Outcome: Progressing Goal: Cardiovascular complication will be avoided Outcome: Progressing   Problem: Nutrition: Goal: Adequate nutrition will be maintained Outcome: Progressing   Problem: Coping: Goal: Level of anxiety will decrease Outcome: Progressing   Problem: Safety: Goal: Ability to remain free from injury will improve Outcome: Progressing

## 2023-12-10 NOTE — Progress Notes (Signed)
 Physical Therapy Treatment Patient Details Name: Gary [name] MRN: [id number] DOB: 11/28/1955 Today's Date: 12/10/2023   History of Present Illness Pt is a 68 y.o. 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 Patient  presents to ED  from rehab facility in [place]. EMS was called due sat in 70's . Per notes patient uses Bipap but facility does not provide this therapy.  Patient was placed on NRB in the field with increase in sat to 89-90 %. Of note patient has interim history of hospitalization form 10/11/2023-10/26/23 with diagnosis of acute on chronic hypoxic and hypercapnic respiratory failure, sepsis secondary to Pseudomonas. MD assessment includes: acute on chronic respiratory failure with hypoxia and hypercapnia, acute metabolic encephalopathy, COPD exacerbation, acute on chronic heart failure with preserved ejection fraction, hypotension, and normocytic anemia.    PT Comments  Pt declined mobility other than rolling left/right this session secondary to concerns with mobility eliciting a BM along with GI bleeding.  Pt put forth good effort with below therex with manual resistance provided as able and required only minimal assist with rolling left/right with use of bed rail.  Pt will benefit from continued PT services upon discharge to safely address deficits listed in patient problem list for decreased caregiver assistance and eventual return to PLOF. Pt will benefit from continued PT services upon discharge to safely address deficits listed in patient problem list for decreased caregiver assistance and eventual return to PLOF.     If plan is discharge home, recommend the following: A lot of help with bathing/dressing/bathroom;Assistance with cooking/housework;Direct supervision/assist for medications management;Assist for transportation;Help with  stairs or ramp for entrance;Direct supervision/assist for financial management;Two people to help with walking and/or transfers   Can travel by private vehicle     No  Equipment Recommendations  Other (comment) (TBD at next venue of care)    Recommendations for Other Services       Precautions / Restrictions Precautions Precautions: Fall Restrictions Weight Bearing Restrictions Per Provider Order: No     Mobility  Bed Mobility Overal bed mobility: Needs Assistance Bed Mobility: Rolling Rolling: Min assist, Used rails         General bed mobility comments: Pt declined getting sitting up to EOB this session due to concerns regarding rectal bleeding with exertion and trunk flexion    Transfers                        Ambulation/Gait                   Stairs             Wheelchair Mobility     Tilt Bed    Modified Rankin (Stroke Patients Only)       Balance                                            Communication Communication Communication: No apparent difficulties Factors Affecting Communication: Trach/intubated  Cognition Arousal: Alert Behavior During Therapy: WFL for tasks assessed/performed   PT - Cognitive impairments: No apparent impairments                         Following commands: Intact  Cueing Cueing Techniques: Verbal cues  Exercises Total Joint Exercises Ankle Circles/Pumps: Strengthening, Both, 10 reps, 15 reps (with manual resistance) Quad Sets: Strengthening, Both, 10 reps, 15 reps Gluteal Sets: Strengthening, Both, 10 reps, 15 reps Short Arc Quad: Strengthening, Both, 10 reps, 15 reps (with manual resistance) Heel Slides: Strengthening, Both, 10 reps (with manual resistance) Hip ABduction/ADduction: AROM, AAROM, Strengthening, Both, 10 reps Other Exercises Other Exercises: rolling left/right x 4 for core strengthening    General Comments        Pertinent Vitals/Pain  Pain Assessment Pain Assessment: 0-10 Pain Score: 6  Pain Location: buttocks pain Pain Descriptors / Indicators: Sore Pain Intervention(s): Premedicated before session, Monitored during session    Home Living                          Prior Function            PT Goals (current goals can now be found in the care plan section) Progress towards PT goals: PT to reassess next treatment    Frequency           PT Plan      Co-evaluation              AM-PAC PT 6 Clicks Mobility   Outcome Measure  Help needed turning from your back to your side while in a flat bed without using bedrails?: A Little Help needed moving from lying on your back to sitting on the side of a flat bed without using bedrails?: A Little Help needed moving to and from a bed to a chair (including a wheelchair)?: A Little Help needed standing up from a chair using your arms (e.g., wheelchair or bedside chair)?: A Little Help needed to walk in hospital room?: A Lot Help needed climbing 3-5 steps with a railing? : Total 6 Click Score: 15    End of Session Equipment Utilized During Treatment: Oxygen  Activity Tolerance: Patient tolerated treatment well Patient left: in bed;with call bell/phone within reach;with nursing/sitter in room Nurse Communication: Mobility status PT Visit Diagnosis: Difficulty in walking, not elsewhere classified (R26.2);Muscle weakness (generalized) (M62.81);Pain Pain - part of body:  (buttocks)     Time: 1451-1510 PT Time Calculation (min) (ACUTE ONLY): 19 min  Charges:    $Therapeutic Exercise: 8-22 mins PT General Charges $$ ACUTE PT VISIT: 1 Visit                     D. Glendia Bertin PT, DPT 12/10/23, 3:40 PM

## 2023-12-10 NOTE — Progress Notes (Signed)
 Called by RN due to trach dislodgement. Arrived to find patient with trach out and RN assisting patient to remain calm. Patient on bipap. Stated tele wires became entangled around trach and pulled trach out when he moved. Able to reinsert trach back into airway with sterile catheter as a guide. Patient tolerated procedure well. Suctioned trach for moderate amount of white thick secretions. Vitals per tele monitor stable. Patient was removed from bipap and placed back on 4 liter nasal cannula as he wished to come off bipap since he was awake.

## 2023-12-10 NOTE — Progress Notes (Signed)
 Arrived in room 239B and observed tracheostomy tube dislodged. O2 sat 95-99% on the monitor. Instructed patient to remain calm, immediately notified RT. Assisted RT in reinsertion of tracheostomy tube. Placed patient in semi-fowlers position, vital signs taken, and maintained on 4 lpm via [place]. Patient remained stable, o2 sat maintained at 97%, no signs of respiratory distress noted.

## 2023-12-10 NOTE — Progress Notes (Signed)
 OT Cancellation Note  Patient Details Name: Gary [name] MRN: [id number] DOB: 1955-09-11   Cancelled Treatment:    Reason Eval/Treat Not Completed: Patient at procedure or test/ unavailable. Third attempt to conduct skilled OT session with Pt, on this current attempt of off unit at procedure. Will re attempt on next available date/time.  Larraine Colas M.S. OTR/L  12/10/23, 4:01 PM

## 2023-12-11 DIAGNOSIS — K529 Noninfective gastroenteritis and colitis, unspecified: Secondary | ICD-10-CM | POA: Diagnosis not present

## 2023-12-11 DIAGNOSIS — J9622 Acute and chronic respiratory failure with hypercapnia: Secondary | ICD-10-CM | POA: Diagnosis not present

## 2023-12-11 DIAGNOSIS — J9621 Acute and chronic respiratory failure with hypoxia: Secondary | ICD-10-CM | POA: Diagnosis not present

## 2023-12-11 LAB — CBC
HCT: 29.2 % — ABNORMAL LOW (ref 39.0–52.0)
HCT: 29.1 % — ABNORMAL LOW (ref 39.0–52.0)
Hemoglobin: 8.1 g/dL — ABNORMAL LOW (ref 13.0–17.0)
Hemoglobin: 8 g/dL — ABNORMAL LOW (ref 13.0–17.0)
MCH: 24.9 pg — ABNORMAL LOW (ref 26.0–34.0)
MCH: 25 pg — ABNORMAL LOW (ref 26.0–34.0)
MCHC: 27.7 g/dL — ABNORMAL LOW (ref 30.0–36.0)
MCHC: 27.5 g/dL — ABNORMAL LOW (ref 30.0–36.0)
MCV: 89.8 fL (ref 80.0–100.0)
MCV: 90.9 fL (ref 80.0–100.0)
Platelets: 136 K/uL — ABNORMAL LOW (ref 150–400)
Platelets: 176 K/uL (ref 150–400)
RBC: 3.25 MIL/uL — ABNORMAL LOW (ref 4.22–5.81)
RBC: 3.2 MIL/uL — ABNORMAL LOW (ref 4.22–5.81)
RDW: 21.1 % — ABNORMAL HIGH (ref 11.5–15.5)
RDW: 21.1 % — ABNORMAL HIGH (ref 11.5–15.5)
WBC: 9 K/uL (ref 4.0–10.5)
WBC: 9.5 K/uL (ref 4.0–10.5)
nRBC: 1 % — ABNORMAL HIGH (ref 0.0–0.2)
nRBC: 1.1 % — ABNORMAL HIGH (ref 0.0–0.2)

## 2023-12-11 LAB — BASIC METABOLIC PANEL WITH GFR
Anion gap: 6 (ref 5–15)
BUN: 21 mg/dL (ref 8–23)
CO2: 36 mmol/L — ABNORMAL HIGH (ref 22–32)
Calcium: 7.2 mg/dL — ABNORMAL LOW (ref 8.9–10.3)
Chloride: 99 mmol/L (ref 98–111)
Creatinine, Ser: 1.3 mg/dL — ABNORMAL HIGH (ref 0.61–1.24)
GFR, Estimated: >60 mL/min (ref >60)
Glucose, Bld: 92 mg/dL (ref 70–99)
Potassium: 3.7 mmol/L (ref 3.5–5.1)
Sodium: 141 mmol/L (ref 135–145)

## 2023-12-11 LAB — MAGNESIUM: Magnesium: 2.2 mg/dL (ref 1.7–2.4)

## 2023-12-11 NOTE — Progress Notes (Signed)
 Progress Note   Patient: Gary [name] FMW:[id number] DOB: January 15, 1956 DOA: 11/28/2023     13 DOS: the patient was seen and examined on 12/11/2023   Brief hospital course: From HPI Gary [name] is a 68 y.o. male with medical history significant of heart failure preserved ejection fraction, hx of  DVT s/p treatment , AAA, COPD, hypertension, anemia, chronic hypoxic and hypercapnic respiratory failure on chronic trach ([place]iley XLT #7 ) on [place] 5-6L during the day and bipap at bedtime. Patient  presents to ED  from  rehab facility in [place]. EMS was called due sat in 70's . Per notes patient uses Bipap but facility does not provide this therapy.    Patient has frequent multiple hospitalization most recent from 10/11/2023-10/26/23 with diagnosis of acute on chronic hypoxic and hypercapnic respiratory failure,sepsis secondary to Pseudomonas  Pneumonia complicated by  Pseudomonas bacteremia in addition to AKI on CKDIIIa.  Of note s/p hospitalization patient was discharged to [facility] and thereafter he was discharged to SNF.    Patient was found to be with acute on chronic hypoxic and hypercapnic respiratory failure and was placed on BiPAP.  Initial chest x-ray concerning for pulmonary vascular congestion so he was given a dose of IV Lasix . CT chest with worsening atelectatic changes in right upper lobe, and no other focal abnormality.  Labs with worsening anemia with hemoglobin at 7.6-anemia panel ordered.  Respiratory viral panel negative, BNP normal at 59.  BUN 35 and creatinine at 1.52 with baseline around 1.2-1.3-not meeting the criteria for AKI.       Assessment and Plan:   #LGIB  Likely diverticular  Patient with multiple maroon colored stools. He has had this in the past. During hospitalization 04/2023 it was thought to be related to diverticular bleed vs SCAD. Because patient had recent colonoscopy showing mod to severe colitis no further GI intervention was recommended at that time.    CTA  of the abd was performed without evidence of extravasation. Close post 2 units of blood transfusion No evidence of shock, lactate within normal limits.  Tagged red cell scan was negative for active GI bleeding  Coag studies WNL Trend H&H daily for now.    # Acute on chronic respiratory failure with hypoxia and hypercapnia Resolving   #Likely OHS #metabolic alkalosis compensatory for Hypercapnic RF  Currently off BiPAP this morning patient has a chronic trach and uses 6 L of oxygen  at baseline.  - Improved back to baseline now Mildly elevated procalcitonin s/p 5-day course of Levaquin  -Continue with supplemental oxygen  during the day  -Continue with BiPAP at night and while sleeping -Continue with supportive care     # Acute metabolic encephalopathy: Resolved Secondary to hypercapnia. - Currently off BiPAP   # COPD exacerbation:  -s/p steroid taper  - Continue with bronchodilator and supportive care   # Acute on chronic HFpEF  Remains volume overloaded.  Renal function slightly worsened. .  Hold spironolactone , wean midodrine  as able.   Unna boots ordered     CKD stage 3a, GFR 45-59 ml/min ([name]) Continue to monitor renal function closely   Hypokalemia Resolved    H/o Chronic colitis - Continue home mesalamine , flex sig with GI showed no active colitis of L sided colon   Normocytic anemia Patient was transfused 1 unit of blood History of prior GI bleed. -s/p IV iron  x 1 dose -Maintain B12 above 400 -Continue oral Fe supplementation  -Monitor hemoglobin per above  -Tagged red blood cell scan  if hgb not improved.  -Transfuse hgb <7   Sleep apnea OHS - Continue BiPAP at night   Morbid obesity ([name]) Estimated body mass index is 53 kg/m as calculated from the following:   Height as of this encounter: 6' 4 (1.93 m).   Weight as of this encounter: 197.5 kg.  - This will complicate overall prognosis   Prolonged Qtc on tele. Continue to monitor.  Avoid qtc  prolonging medications.    Hypotension Resolving  Blood pressure stable remains soft.  -Continue home midodrine    Anxiety, Continue Atarax    Foley catheter was discontinued on 8/17, patient is voiding well.     Diet: Soft per GI  DVT Prophylaxis: On SCD for now given GI bleed   Advance goals of care discussion: Full code   Family Communication: None at bedside   Disposition: Back to skilled nursing facility when medically stable  Subjective:  Patient seen and examined at bedside this morning currently on intranasal oxygen  He admits to improvement in respiratory function Denies any bleeding overnight and therefore diet has been advanced   Physical Exam: Constitutional: In no distress.  Trach capped.  Cardiovascular: Normal rate, regular rhythm. No lower extremity edema  Pulmonary: Non labored breathing on [place], no wheezing or rales.   Abdominal: Soft. Non distended and non tender Musculoskeletal: Normal range of motion.     Neurological: Alert and oriented to person, place, and time. Non focal  Skin: Skin is warm and dry.     Vitals:   12/11/23 0128 12/11/23 0345 12/11/23 0738 12/11/23 1115  BP: (!) 101/59 109/65 95/62 (!) 99/49  Pulse:  76 73 79  Resp:  20 18 18   Temp:  98.3 F (36.8 C) 98.8 F (37.1 C) 97.9 F (36.6 C)  TempSrc:  Oral    SpO2:  100% 100% 97%  Weight:      Height:        Data Reviewed:    Latest Ref Rng & Units 12/11/2023    5:10 AM 12/10/2023    7:16 PM 12/10/2023    6:16 AM  CBC  WBC 4.0 - 10.5 K/uL 9.0  10.1  9.7   Hemoglobin 13.0 - 17.0 g/dL 8.1  8.1  7.9   Hematocrit 39.0 - 52.0 % 29.2  28.9  28.6   Platelets 150 - 400 K/uL 136  141  134        Latest Ref Rng & Units 12/11/2023    5:10 AM 12/10/2023    6:16 AM 12/09/2023    1:46 AM  BMP  Glucose 70 - 99 mg/dL 92  89  98   BUN 8 - 23 mg/dL 21  27  29    Creatinine 0.61 - 1.24 mg/dL 8.69  8.72  8.28   Sodium 135 - 145 mmol/L 141  141  139   Potassium 3.5 - 5.1 mmol/L 3.7  3.3  3.6    Chloride 98 - 111 mmol/L 99  98  94   CO2 22 - 32 mmol/L 36  37  38   Calcium  8.9 - 10.3 mg/dL 7.2  6.9  7.0       Author: [name], MD 12/11/2023 4:39 PM  For on call review [email or web address].

## 2023-12-11 NOTE — Progress Notes (Signed)
 PT Cancellation Note  Patient Details Name: Gary [name] MRN: [id number] DOB: Dec 19, 1955   Cancelled Treatment:    Reason Eval/Treat Not Completed: Other (comment) Pt was laying in bed in NAD, reports he is too tired from moving around in bed earlier today and that I'll be ready tomorrow but that he is not ready or willing to get up with PT today.   Carmin JONELLE Deed, DPT 12/11/2023, 4:20 PM

## 2023-12-11 NOTE — TOC Progression Note (Signed)
 Transition of Care [facility]) - Progression Note    Patient Details  Name: Gary [name] MRN: [id number] Date of Birth: August 01, 1955  [facility]) CM/SW Contact  [name], LCSW Phone Number: 12/11/2023, 12:29 PM  Clinical Narrative:   RN confirmed patient was stable on bipap overnight.  Expected Discharge Plan: Skilled Nursing Facility Barriers to Discharge: Continued Medical Work up               Expected Discharge Plan and Services     Post Acute Care Choice: Skilled Nursing Facility Living arrangements for the past 2 months: Skilled Nursing Facility [facility])                                       Social Drivers of Health (SDOH) Interventions SDOH Screenings   Food Insecurity: No Food Insecurity (11/30/2023)  Housing: High Risk (11/30/2023)  Transportation Needs: No Transportation Needs (11/30/2023)  Utilities: Not At Risk (11/30/2023)  Depression (PHQ2-9): Low Risk  (06/07/2021)  Financial Resource Strain: High Risk (11/21/2022)   Received from [facility]  Physical Activity: Insufficiently Active (05/03/2017)  Social Connections: Moderately Isolated (11/30/2023)  Stress: Stress Concern Present (11/20/2022)   Received from [facility]  Tobacco Use: Medium Risk (11/30/2023)    Readmission Risk Interventions    03/19/2023    9:58 AM 11/05/2022    2:59 PM 11/05/2022   12:16 PM  Readmission Risk Prevention Plan  Transportation Screening Complete Complete Complete  Medication Review Oceanographer) Complete Complete Complete  PCP or Specialist appointment within 3-5 days of discharge Complete Complete Complete  HRI or Home Care Consult Complete Complete Complete  SW Recovery Care/Counseling Consult Complete Complete Complete  Palliative Care Screening Complete Complete Complete  Skilled Nursing Facility Not Applicable Not Applicable Not Applicable

## 2023-12-11 NOTE — Progress Notes (Signed)
 Patient had BM with dark-green stool. Patient requested off BiPAP and placed back on 4 LPM Gary [name].

## 2023-12-11 NOTE — Plan of Care (Signed)
  Problem: Education: Goal: Knowledge of General Education information will improve Description: Including pain rating scale, medication(s)/side effects and non-pharmacologic comfort measures Outcome: Progressing   Problem: Health Behavior/Discharge Planning: Goal: Ability to manage health-related needs will improve Outcome: Progressing   Problem: Nutrition: Goal: Adequate nutrition will be maintained Outcome: Progressing   Problem: Coping: Goal: Level of anxiety will decrease Outcome: Progressing   Problem: Elimination: Goal: Will not experience complications related to bowel motility Outcome: Progressing Goal: Will not experience complications related to urinary retention Outcome: Progressing   Problem: Pain Managment: Goal: General experience of comfort will improve and/or be controlled Outcome: Progressing   Problem: Safety: Goal: Ability to remain free from injury will improve Outcome: Progressing

## 2023-12-11 NOTE — Progress Notes (Signed)
   12/11/23 2012  Oxygen  Therapy/Pulse Ox  O2 Device Nasal Cannula  O2 Therapy Oxygen  humidified  O2 Flow Rate (L/min) 5 L/min  SpO2 (!) 66 %   Patient found with trach cap displaced, SAT 66% on 5L [place]. Trach care completed and trach capped, with oxygen  SATS improving into the 90s. Scheduled breathing treatment given.

## 2023-12-11 NOTE — Progress Notes (Signed)
 OT Cancellation Note  Patient Details Name: Gary [name] MRN: [id number] DOB: Jun 28, 1955   Cancelled Treatment:    Reason Eval/Treat Not Completed: Patient declined, no reason specified. Attempted skilled therapy session multiple times this date, pt refused stating he was too fatigued to participate despite verbal encouragement. Will re attempt on next available date/time.   Larraine Colas M.S. OTR/L  12/11/23, 3:53 PM

## 2023-12-11 NOTE — Plan of Care (Signed)
  Problem: Education: Goal: Knowledge of General Education information will improve Description: Including pain rating scale, medication(s)/side effects and non-pharmacologic comfort measures Outcome: Progressing   Problem: Clinical Measurements: Goal: Diagnostic test results will improve Outcome: Progressing Goal: Respiratory complications will improve Outcome: Progressing Goal: Cardiovascular complication will be avoided Outcome: Progressing   Problem: Activity: Goal: Risk for activity intolerance will decrease Outcome: Progressing   Problem: Nutrition: Goal: Adequate nutrition will be maintained Outcome: Progressing   Problem: Elimination: Goal: Will not experience complications related to bowel motility Outcome: Progressing   Problem: Pain Managment: Goal: General experience of comfort will improve and/or be controlled Outcome: Progressing   Problem: Safety: Goal: Ability to remain free from injury will improve Outcome: Progressing

## 2023-12-11 NOTE — Progress Notes (Signed)
 Gary [name] , MD [address], [address], [place], [place], [address] Phone: [phone] Fax: [phone]   Gary [name] is being followed for lower GI bleed    Subjective:  Doing well was having his labs had a small bowel which was dark in color  Objective: Vital signs in last 24 hours: Vitals:   12/11/23 0040 12/11/23 0128 12/11/23 0345 12/11/23 0738  BP: (!) 99/57 (!) 101/59 109/65 95/62  Pulse: 75  76 73  Resp: 19  20 18   Temp: 97.9 F (36.6 C)  98.3 F (36.8 C) 98.8 F (37.1 C)  TempSrc:   Oral   SpO2: 100%  100% 100%  Weight:      Height:       Weight change:   Intake/Output Summary (Last 24 hours) at 12/11/2023 0801 Last data filed at 12/11/2023 0500 Gross per 24 hour  Intake 1680 ml  Output 1300 ml  Net 380 ml     Exam:  Abdomen: soft, nontender, normal bowel sounds   Lab Results: @LABTEST2 @ Micro Results: No results found for this or any previous visit (from the past 240 hours). Studies/Results: NM GI Blood Loss Result Date: 12/10/2023 CLINICAL DATA:  Dropping hemoglobin, black loose stools this morning. EXAM: NUCLEAR MEDICINE GASTROINTESTINAL BLEEDING SCAN TECHNIQUE: Sequential abdominal images were obtained following intravenous administration of Tc-37m labeled red blood cells. RADIOPHARMACEUTICALS:  21.9 mCi Tc-67m pertechnetate in-vitro labeled red cells. COMPARISON:  12/06/2023 FINDINGS: Imaging was carried out for 2 hours. Satisfactory vascular distribution of radiopharmaceutical. Late in the second hour, accentuated bladder activity is visible, likely due to some free pertechnetate being excreted. During the course of the 2 hour observation, no active gastrointestinal bleeding is detected. IMPRESSION: 1. No active gastrointestinal bleeding is detected over the two hour observation. Electronically Signed   By: Ryan Salvage M.D.   On: 12/10/2023 18:34   Medications: I have reviewed the patient's current medications. Scheduled Meds:   sodium chloride    Intravenous Once   sodium chloride    Intravenous Once   sodium chloride    Intravenous Once   sodium chloride    Intravenous Once   escitalopram   20 mg Oral Daily   Fe Fum-Vit C-Vit B12-FA  1 capsule Oral BID   fluticasone  furoate-vilanterol  1 puff Inhalation Daily   guaiFENesin   600 mg Oral BID   hydrOXYzine   25 mg Oral TID   ipratropium-albuterol   3 mL Nebulization BID   mesalamine   1.2 g Oral BID   midodrine   5 mg Oral TID WC   pantoprazole   40 mg Oral Daily   pregabalin   50 mg Oral Daily   Continuous Infusions: PRN Meds:.acetaminophen  **OR** acetaminophen , albuterol , artificial tears, ondansetron  **OR** ondansetron  (ZOFRAN ) IV, oxyCODONE , traZODone    Assessment: Principal Problem:   Acute on chronic respiratory failure with hypoxia and hypercapnia ([name]) Active Problems:   Morbid obesity ([name])   Hypotension   Sleep apnea   Acute on chronic heart failure with preserved ejection fraction (HFpEF) ([name])   Gastrointestinal hemorrhage associated with intestinal diverticulosis   Blood loss anemia   Chronic colitis   Acute metabolic encephalopathy   Acute on chronic hypoxic respiratory failure ([name])   COPD exacerbation ([name])   CKD stage 3a, GFR 45-59 ml/min ([name])   Normocytic anemia     Latest Ref Rng & Units 12/11/2023    5:10 AM 12/10/2023    7:16 PM 12/10/2023    6:16 AM  CBC  WBC 4.0 - 10.5 K/uL 9.0  10.1  9.7   Hemoglobin 13.0 - 17.0 g/dL 8.1  8.1  7.9   Hematocrit 39.0 - 52.0 % 29.2  28.9  28.6   Platelets 150 - 400 K/uL 136  141  134     Gary [name] 68 y.o. male  with a history of prior lower GI bleed concerns for possible diverticulosis associated colitis since versus possible ulcerative colitis.  Prior right hemicolectomy.  Multiple comorbidities including AAA, AKI in the past CKD hypertension iron  deficiency anemia.  Follows with Dr. Nandigam at [place].  Has been started on mesalamine  in the past no subsequent outpatient follow-up.  Admitted  with respiratory failure.  I had been consulted for multiple episodes of rectal bleeding to CT angiogram showed no acute bleeding but showed inflammation in the left side of the colon around the sigmoid colon which was also previously inflamed back in 2024 when the patient had a colonoscopy he presented similarly with a GI bleed.  I performed a flexible sigmoidoscopy without a bowel prep [patient did not want an enema] and saw a large quantity of blood in the left colon could not get into the right colon due to the solid stool.  Once I washed all the blood no active bleeding was noted.Since the procedure had further episodes of bleeding.  Hemoglobin was stable at 7.4 g then dropped to 6.6 g and today to 7.9 g.  The patient has so far had 2 CT angiograms and 1 tagged RBC scan on the 21st of this month and the 23rd of this month.  I still suspect a diverticular bleed or bleeding at the anastomosis, repeat tagged RBC scan yesterday evening was also negative.    Plan: Monitor CBC and transfuse as needed Tagged RBC scan from yesterday was negative- Hb stable 8.1  No further GI intervention for now .     LOS: 13 days   Gary Kung, MD 12/11/2023, 8:01 AM

## 2023-12-12 DIAGNOSIS — J9621 Acute and chronic respiratory failure with hypoxia: Secondary | ICD-10-CM | POA: Diagnosis not present

## 2023-12-12 DIAGNOSIS — J9622 Acute and chronic respiratory failure with hypercapnia: Secondary | ICD-10-CM | POA: Diagnosis not present

## 2023-12-12 LAB — CBC WITH DIFFERENTIAL/PLATELET
Abs Granulocyte: 5.7 K/uL (ref 1.5–6.5)
Abs Immature Granulocytes: 0.09 K/uL — ABNORMAL HIGH (ref 0.00–0.07)
Basophils Absolute: 0 K/uL (ref 0.0–0.1)
Basophils Relative: 0 %
Eosinophils Absolute: 0.1 K/uL (ref 0.0–0.5)
Eosinophils Relative: 2 %
HCT: 29.9 % — ABNORMAL LOW (ref 39.0–52.0)
Hemoglobin: 8.1 g/dL — ABNORMAL LOW (ref 13.0–17.0)
Immature Granulocytes: 1 %
Lymphocytes Relative: 13 %
Lymphs Abs: 1 K/uL (ref 0.7–4.0)
MCH: 24.9 pg — ABNORMAL LOW (ref 26.0–34.0)
MCHC: 27.1 g/dL — ABNORMAL LOW (ref 30.0–36.0)
MCV: 92 fL (ref 80.0–100.0)
Monocytes Absolute: 0.9 K/uL (ref 0.1–1.0)
Monocytes Relative: 11 %
Neutro Abs: 5.7 K/uL (ref 1.7–7.7)
Neutrophils Relative %: 73 %
Platelets: 147 K/uL — ABNORMAL LOW (ref 150–400)
RBC: 3.25 MIL/uL — ABNORMAL LOW (ref 4.22–5.81)
RDW: 20.7 % — ABNORMAL HIGH (ref 11.5–15.5)
WBC: 7.8 K/uL (ref 4.0–10.5)
nRBC: 0.8 % — ABNORMAL HIGH (ref 0.0–0.2)

## 2023-12-12 LAB — BASIC METABOLIC PANEL WITH GFR
Anion gap: 6 (ref 5–15)
BUN: 20 mg/dL (ref 8–23)
CO2: 35 mmol/L — ABNORMAL HIGH (ref 22–32)
Calcium: 7.7 mg/dL — ABNORMAL LOW (ref 8.9–10.3)
Chloride: 103 mmol/L (ref 98–111)
Creatinine, Ser: 1.27 mg/dL — ABNORMAL HIGH (ref 0.61–1.24)
GFR, Estimated: >60 mL/min (ref >60)
Glucose, Bld: 88 mg/dL (ref 70–99)
Potassium: 3.8 mmol/L (ref 3.5–5.1)
Sodium: 144 mmol/L (ref 135–145)

## 2023-12-12 NOTE — TOC Progression Note (Signed)
 Transition of Care [facility]) - Progression Note    Patient Details  Name: Gary [name] MRN: [id number] Date of Birth: [birth year]  Transition of [facility]) CM/SW Contact  Corean ONEIDA Haddock, RN Phone Number: 12/12/2023, 4:49 PM  Clinical Narrative:     Tammy with [facility] confirms that [facility] can offer a bed.  They would need to order a BIPAP, trach supplies, 10 L concentrator, and would need.   Presented bed offer to patient and he is also aware that [facility] can accept him back. He states that he would go to the streets before going back to [facility].  Patient states that he will discuss [facility] with his sister tonight and make a decision by tomorrow.  He is aware there are no other bed offers   Expected Discharge Plan: Skilled Nursing Facility Barriers to Discharge: Continued Medical Work up               Expected Discharge Plan and Services     Post Acute Care Choice: Skilled Nursing Facility Living arrangements for the past 2 months: Skilled Nursing Facility [facility])                                       Social Drivers of Health (SDOH) Interventions SDOH Screenings   Food Insecurity: No Food Insecurity (11/30/2023)  Housing: High Risk (11/30/2023)  Transportation Needs: No Transportation Needs (11/30/2023)  Utilities: Not At Risk (11/30/2023)  Depression (PHQ2-9): Low Risk  (06/07/2021)  Financial Resource Strain: High Risk (11/21/2022)   Received from [facility]  Physical Activity: Insufficiently Active (05/03/2017)  Social Connections: Moderately Isolated (11/30/2023)  Stress: Stress Concern Present (11/20/2022)   Received from [facility]  Tobacco Use: Medium Risk (11/30/2023)    Readmission Risk Interventions    03/19/2023    9:58 AM 11/05/2022    2:59 PM 11/05/2022   12:16 PM  Readmission Risk Prevention Plan  Transportation Screening Complete Complete Complete  Medication Review [employer])  Complete Complete Complete  PCP or Specialist appointment within 3-5 days of discharge Complete Complete Complete  HRI or Home Care Consult Complete Complete Complete  SW Recovery Care/Counseling Consult Complete Complete Complete  Palliative Care Screening Complete Complete Complete  Skilled Nursing Facility Not Applicable Not Applicable Not Applicable

## 2023-12-12 NOTE — Progress Notes (Signed)
 Progress Note   Patient: Gary [name] FMW:[id number] DOB: Sep 28, 1955 DOA: 11/28/2023     14 DOS: the patient was seen and examined on 12/12/2023   Brief hospital course: From HPI Gary [name] is a 68 y.o. male with medical history significant of heart failure preserved ejection fraction, hx of  DVT s/p treatment , AAA, COPD, hypertension, anemia, chronic hypoxic and hypercapnic respiratory failure on chronic trach (Shiley XLT #7 ) on [place] 5-6L during the day and bipap at bedtime. Patient  presents to ED  from  rehab facility in [place]. EMS was called due sat in 70's . Per notes patient uses Bipap but facility does not provide this therapy.    Patient has frequent multiple hospitalization most recent from 10/11/2023-10/26/23 with diagnosis of acute on chronic hypoxic and hypercapnic respiratory failure,sepsis secondary to Pseudomonas  Pneumonia complicated by  Pseudomonas bacteremia in addition to AKI on CKDIIIa.  Of note s/p hospitalization patient was discharged to [facility] and thereafter he was discharged to SNF.    Patient was found to be with acute on chronic hypoxic and hypercapnic respiratory failure and was placed on BiPAP.  Initial chest x-ray concerning for pulmonary vascular congestion so he was given a dose of IV Lasix . CT chest with worsening atelectatic changes in right upper lobe, and no other focal abnormality.  Labs with worsening anemia with hemoglobin at 7.6-anemia panel ordered.  Respiratory viral panel negative, BNP normal at 59.  BUN 35 and creatinine at 1.52 with baseline around 1.2-1.3-not meeting the criteria for AKI.       Assessment and Plan:   #LGIB  Likely diverticular  Patient with multiple maroon colored stools. He has had this in the past. During hospitalization 04/2023 it was thought to be related to diverticular bleed vs SCAD. Because patient had recent colonoscopy showing mod to severe colitis no further GI intervention was recommended at that time.  CTA of  the abd was performed without evidence of extravasation. Status post 2 units of blood transfusion No evidence of shock, lactate within normal limits.  Tagged red cell scan was negative for active GI bleeding  Coag studies WNL Continue to monitor CBC   # Acute on chronic respiratory failure with hypoxia and hypercapnia Resolving   #Likely OHS #metabolic alkalosis compensatory for Hypercapnic RF  Currently off BiPAP this morning patient has a chronic trach and uses 6 L of oxygen  at baseline.  - Improved back to baseline now Mildly elevated procalcitonin s/p 5-day course of Levaquin  -Continue with supplemental oxygen  during the day  -Continue with BiPAP at night and while sleeping -Continue with supportive care     # Acute metabolic encephalopathy: Resolved Secondary to hypercapnia. - Currently off BiPAP   # COPD exacerbation:  -s/p steroid taper  - Continue with bronchodilator and supportive care   # Acute on chronic HFpEF  Remains volume overloaded.  Renal function slightly worsened. .  Hold spironolactone , wean midodrine  as able.   Unna boots ordered     CKD stage 3a, GFR 45-59 ml/min ([name]) Continue to monitor renal function closely     Hypokalemia Resolved    H/o Chronic colitis - Continue home mesalamine , flex sig with GI showed no active colitis of L sided colon   Normocytic anemia Patient was transfused 1 unit of blood History of prior GI bleed. -s/p IV iron  x 1 dose -Maintain B12 above 400 -Continue oral Fe supplementation  -Monitor hemoglobin per above  -Tagged red blood cell scan if hgb  not improved.  -Transfuse hgb <7   Sleep apnea OHS - Continue BiPAP at night   Morbid obesity ([name]) Estimated body mass index is 53 kg/m as calculated from the following:   Height as of this encounter: 6' 4 (1.93 m).   Weight as of this encounter: 197.5 kg.  - This will complicate overall prognosis   Prolonged Qtc on tele. Continue to monitor.  Avoid qtc  prolonging medications.    Hypotension Resolving  Blood pressure stable remains soft.  -Continue home midodrine    Anxiety, Continue Atarax    Foley catheter was discontinued on 8/17, patient is voiding well.     Diet: Soft per GI   DVT Prophylaxis: On SCD for now given GI bleed   Advance goals of care discussion: Full code   Family Communication: None at bedside   Disposition: Back to skilled nursing facility when medically stable   Subjective:  Patient seen and examined at bedside this morning currently on intranasal oxygen  Denies any acute overnight events Denies chest pain nausea vomiting Continues to await placement     Physical Exam: Constitutional: In no distress.  Trach capped.  Cardiovascular: Normal rate, regular rhythm. No lower extremity edema  Pulmonary: Non labored breathing on [place], no wheezing or rales.   Abdominal: Soft. Non distended and non tender Musculoskeletal: Normal range of motion.     Neurological: Alert and oriented to person, place, and time. Non focal  Skin: Skin is warm and dry.   Data Reviewed:    Latest Ref Rng & Units 12/12/2023    5:37 AM 12/11/2023    5:09 PM 12/11/2023    5:10 AM  CBC  WBC 4.0 - 10.5 K/uL 7.8  9.5  9.0   Hemoglobin 13.0 - 17.0 g/dL 8.1  8.0  8.1   Hematocrit 39.0 - 52.0 % 29.9  29.1  29.2   Platelets 150 - 400 K/uL 147  176  136        Latest Ref Rng & Units 12/12/2023    5:37 AM 12/11/2023    5:10 AM 12/10/2023    6:16 AM  BMP  Glucose 70 - 99 mg/dL 88  92  89   BUN 8 - 23 mg/dL 20  21  27    Creatinine 0.61 - 1.24 mg/dL 8.72  8.69  8.72   Sodium 135 - 145 mmol/L 144  141  141   Potassium 3.5 - 5.1 mmol/L 3.8  3.7  3.3   Chloride 98 - 111 mmol/L 103  99  98   CO2 22 - 32 mmol/L 35  36  37   Calcium  8.9 - 10.3 mg/dL 7.7  7.2  6.9      Vitals:   12/12/23 0809 12/12/23 0917 12/12/23 1200 12/12/23 1538  BP: 100/60  (!) 99/54 (!) 90/50  Pulse: 76 79 77 79  Resp: 20 18 18 20   Temp: 98.6 F (37 C)  98.4 F  (36.9 C) 98.3 F (36.8 C)  TempSrc: Oral  Oral Oral  SpO2: 97% 98% 98% 98%  Weight:      Height:         Author: [name], MD 12/12/2023 5:35 PM  For on call review [email or web address].

## 2023-12-12 NOTE — Progress Notes (Signed)
 PT Cancellation Note  Patient Details Name: Gary [name] MRN: [id number] DOB: 03-07-1956   Cancelled Treatment:    Reason Eval/Treat Not Completed: Other (comment) Spoke with pt this AM about plans to try and get up - he requests PT to return in the afternoon.  On PM attempt with OT pt laying in bed (keeping eyes closed most of the time), reporting that he is not considering to get up at this time (citing concerns over rectal bleeding) and generally showing no interest in working with rehab.  Despite much discussion, encouragement, education and reinforcement about starting to increase mobility he he was obstinate about his need to  wait on that.  He clearly had some increasing frustration and ultimately even refused LE exercises which he ostensibly seemed to be willing to do at the start of the session.  Pt refusing rehab today, will continue to try and see him and encourage mobility as he is willing.  Carmin JONELLE Deed, DPT 12/12/2023, 2:24 PM

## 2023-12-12 NOTE — Progress Notes (Signed)
 OT Cancellation Note  Patient Details Name: Gary [name] MRN: [id number] DOB: [birth year]   Cancelled Treatment:    Reason Eval/Treat Not Completed: Patient declined, no reason specified. Pt refused to participate in skilled occupational therapy session on this date. Pt participated in depth conversation with dino and PT regarding the importance of progressing/maintaining strength and endurance, pt verbalizes understanding, however remains firm on declining therapy on this date.Will re attempt on next available.   Larraine Colas M.S. OTR/L  12/12/23, 2:06 PM

## 2023-12-12 NOTE — Plan of Care (Signed)
  Problem: Clinical Measurements: Goal: Ability to maintain clinical measurements within normal limits will improve Outcome: Progressing   Problem: Clinical Measurements: Goal: Respiratory complications will improve Outcome: Progressing   Problem: Clinical Measurements: Goal: Cardiovascular complication will be avoided Outcome: Progressing   Problem: Elimination: Goal: Will not experience complications related to bowel motility Outcome: Progressing   Problem: Respiratory: Goal: Ability to maintain a clear airway will improve Outcome: Progressing   Problem: Respiratory: Goal: Ability to maintain adequate ventilation will improve Outcome: Progressing

## 2023-12-13 DIAGNOSIS — J9622 Acute and chronic respiratory failure with hypercapnia: Secondary | ICD-10-CM | POA: Diagnosis not present

## 2023-12-13 DIAGNOSIS — J9621 Acute and chronic respiratory failure with hypoxia: Secondary | ICD-10-CM | POA: Diagnosis not present

## 2023-12-13 LAB — BASIC METABOLIC PANEL WITH GFR
Anion gap: 5 (ref 5–15)
BUN: 20 mg/dL (ref 8–23)
CO2: 36 mmol/L — ABNORMAL HIGH (ref 22–32)
Calcium: 7.8 mg/dL — ABNORMAL LOW (ref 8.9–10.3)
Chloride: 101 mmol/L (ref 98–111)
Creatinine, Ser: 1.46 mg/dL — ABNORMAL HIGH (ref 0.61–1.24)
GFR, Estimated: 53 mL/min — ABNORMAL LOW (ref >60)
Glucose, Bld: 107 mg/dL — ABNORMAL HIGH (ref 70–99)
Potassium: 4.1 mmol/L (ref 3.5–5.1)
Sodium: 142 mmol/L (ref 135–145)

## 2023-12-13 LAB — CBC WITH DIFFERENTIAL/PLATELET
Abs Immature Granulocytes: 0.15 K/uL — ABNORMAL HIGH (ref 0.00–0.07)
Basophils Absolute: 0 K/uL (ref 0.0–0.1)
Basophils Relative: 0 %
Eosinophils Absolute: 0.1 K/uL (ref 0.0–0.5)
Eosinophils Relative: 1 %
HCT: 32.7 % — ABNORMAL LOW (ref 39.0–52.0)
Hemoglobin: 8.7 g/dL — ABNORMAL LOW (ref 13.0–17.0)
Immature Granulocytes: 2 %
Lymphocytes Relative: 13 %
Lymphs Abs: 1.1 K/uL (ref 0.7–4.0)
MCH: 24.9 pg — ABNORMAL LOW (ref 26.0–34.0)
MCHC: 26.6 g/dL — ABNORMAL LOW (ref 30.0–36.0)
MCV: 93.7 fL (ref 80.0–100.0)
Monocytes Absolute: 0.9 K/uL (ref 0.1–1.0)
Monocytes Relative: 11 %
Neutro Abs: 6.2 K/uL (ref 1.7–7.7)
Neutrophils Relative %: 73 %
Platelets: 165 K/uL (ref 150–400)
RBC: 3.49 MIL/uL — ABNORMAL LOW (ref 4.22–5.81)
RDW: 20.5 % — ABNORMAL HIGH (ref 11.5–15.5)
WBC: 8.5 K/uL (ref 4.0–10.5)
nRBC: 0.9 % — ABNORMAL HIGH (ref 0.0–0.2)

## 2023-12-13 NOTE — Progress Notes (Signed)
 Progress Note   Patient: Gary [name] FMW:[id number] DOB: 02/08/1956 DOA: 11/28/2023     15 DOS: the patient was seen and examined on 12/13/2023    Brief hospital course: From HPI Gary [name] is a 68 y.o. male with medical history significant of heart failure preserved ejection fraction, hx of  DVT s/p treatment , AAA, COPD, hypertension, anemia, chronic hypoxic and hypercapnic respiratory failure on chronic trach (Shiley XLT #7 ) on [place] 5-6L during the day and bipap at bedtime. Patient  presents to ED  from  rehab facility in [place]. EMS was called due sat in 70's . Per notes patient uses Bipap but facility does not provide this therapy.    Patient has frequent multiple hospitalization most recent from 10/11/2023-10/26/23 with diagnosis of acute on chronic hypoxic and hypercapnic respiratory failure,sepsis secondary to Pseudomonas  Pneumonia complicated by  Pseudomonas bacteremia in addition to AKI on CKDIIIa.  Of note s/p hospitalization patient was discharged to [facility] and thereafter he was discharged to SNF.    Patient was found to be with acute on chronic hypoxic and hypercapnic respiratory failure and was placed on BiPAP.  Initial chest x-ray concerning for pulmonary vascular congestion so he was given a dose of IV Lasix . CT chest with worsening atelectatic changes in right upper lobe, and no other focal abnormality.  Labs with worsening anemia with hemoglobin at 7.6-anemia panel ordered.  Respiratory viral panel negative, BNP normal at 59.  BUN 35 and creatinine at 1.52 with baseline around 1.2-1.3-not meeting the criteria for AKI.       Assessment and Plan:   #LGIB  Likely diverticular  Patient with multiple maroon colored stools. He has had this in the past. During hospitalization 04/2023 it was thought to be related to diverticular bleed vs SCAD. Because patient had recent colonoscopy showing mod to severe colitis no further GI intervention was recommended at that time.  CTA  of the abd was performed without evidence of extravasation. Status post 2 units of blood transfusion No evidence of shock, lactate within normal limits.  Tagged red cell scan was negative for active GI bleeding  Coag studies WNL Monitor CBC   # Acute on chronic respiratory failure with hypoxia and hypercapnia Resolving   #Likely OHS #metabolic alkalosis compensatory for Hypercapnic RF  Currently off BiPAP this morning patient has a chronic trach and uses 6 L of oxygen  at baseline.  - Improved back to baseline now Mildly elevated procalcitonin s/p 5-day course of Levaquin  -Continue with supplemental oxygen  during the day  Continue with BiPAP at night and while sleeping -Continue with supportive care     # Acute metabolic encephalopathy: Resolved Secondary to hypercapnia. - Currently off BiPAP   # COPD exacerbation:  -s/p steroid taper  Continue with bronchodilator and supportive care   # Acute on chronic HFpEF  Remains volume overloaded.  Renal function slightly worsened. .  Hold spironolactone , wean midodrine  as able.   Unna boots ordered     CKD stage 3a, GFR 45-59 ml/min ([name]) Continue to monitor renal function closely     Hypokalemia Resolved    H/o Chronic colitis - Continue home mesalamine , flex sig with GI showed no active colitis of L sided colon   Normocytic anemia Patient was transfused 1 unit of blood History of prior GI bleed. -s/p IV iron  x 1 dose -Maintain B12 above 400 -Continue oral Fe supplementation  -Monitor hemoglobin per above  -Tagged red blood cell scan if hgb not improved.  -  Transfuse hgb <7   Sleep apnea OHS - Continue BiPAP at night   Morbid obesity ([name]) Estimated body mass index is 53 kg/m as calculated from the following:   Height as of this encounter: 6' 4 (1.93 m).   Weight as of this encounter: 197.5 kg.  - This will complicate overall prognosis   Prolonged Qtc on tele. Continue to monitor.  Avoid qtc prolonging  medications.    Hypotension Resolving  Blood pressure stable remains soft.  -Continue home midodrine    Anxiety, Continue Atarax    Foley catheter was discontinued on 8/17, patient is voiding well.     Diet: Soft per GI   DVT Prophylaxis: On SCD for now given GI bleed   Advance goals of care discussion: Full code   Family Communication: None at bedside   Disposition: Back to skilled nursing facility when medically stable   Subjective:  Patient refused treatment from respiratory therapy as well as physical therapy today Has accepted the facility however still working on device availability before discharge   Physical Exam: Constitutional: In no distress.  Trach capped.  Cardiovascular: Normal rate, regular rhythm. No lower extremity edema  Pulmonary: Non labored breathing on [place], no wheezing or rales.   Abdominal: Soft. Non distended and non tender Musculoskeletal: Normal range of motion.     Neurological: Alert and oriented to person, place, and time. Non focal  Skin: Skin is warm and dry.   Data Reviewed:  Vitals:   12/13/23 0825 12/13/23 0900 12/13/23 1128 12/13/23 1518  BP: (!) 111/58  (!) 95/56 (!) 115/59  Pulse: 80  80 86  Resp: 19  (!) 23 (!) 28  Temp: 98.7 F (37.1 C)  98 F (36.7 C) 98.3 F (36.8 C)  TempSrc: Oral  Axillary Oral  SpO2: 96%  94% 91%  Weight:  (!) 197.5 kg    Height:          Latest Ref Rng & Units 12/13/2023    5:55 AM 12/12/2023    5:37 AM 12/11/2023    5:09 PM  CBC  WBC 4.0 - 10.5 K/uL 8.5  7.8  9.5   Hemoglobin 13.0 - 17.0 g/dL 8.7  8.1  8.0   Hematocrit 39.0 - 52.0 % 32.7  29.9  29.1   Platelets 150 - 400 K/uL 165  147  176        Latest Ref Rng & Units 12/13/2023    5:55 AM 12/12/2023    5:37 AM 12/11/2023    5:10 AM  BMP  Glucose 70 - 99 mg/dL 892  88  92   BUN 8 - 23 mg/dL 20  20  21    Creatinine 0.61 - 1.24 mg/dL 8.53  8.72  8.69   Sodium 135 - 145 mmol/L 142  144  141   Potassium 3.5 - 5.1 mmol/L 4.1  3.8  3.7   Chloride  98 - 111 mmol/L 101  103  99   CO2 22 - 32 mmol/L 36  35  36   Calcium  8.9 - 10.3 mg/dL 7.8  7.7  7.2      Author: [name], MD 12/13/2023 3:41 PM  For on call review [email or web address].

## 2023-12-13 NOTE — Progress Notes (Addendum)
 Desaturated into the high 80s on 5L n/c. Pt refused biPap. Increased O2 to 6L. O2 91%  Pt noted to desat to 80%. RT and this writer to the bedside. Trach red cap was off and pt was holding it in his hand. Once cap was replaced, O2 sat improved to mid 90s. RT encouraged biPap but pt refused. Titrated down to 5L.

## 2023-12-13 NOTE — Progress Notes (Signed)
 OT Cancellation Note  Patient Details Name: Gary [name] MRN: [id number] DOB: [birth year]   Cancelled Treatment:    Reason Eval/Treat Not Completed: Other (comment). Pt encouraged to participate in therapeutic intervention but refuses multiple times and states,  Didn't you hear me? I said I was tired.  Izetta Claude, MS, OTR/L , CBIS ascom [phone]  12/13/23, 1:55 PM

## 2023-12-13 NOTE — TOC Progression Note (Signed)
 Transition of Care [facility]) - Progression Note    Patient Details  Name: Gary [name] MRN: [id number] Date of Birth: 1955/09/12  Transition of Care [facility]) CM/SW Contact  [name], LCSW Phone Number: 12/13/2023, 1:01 PM  Clinical Narrative:  Patient has accepted the bed offer from [facility]. SNF liaison is ordering supplies. CSW started serbia. Hoping for discharge Monday or Tuesday once auth is approved and supplies obtained.   Expected Discharge Plan: Skilled Nursing Facility Barriers to Discharge: Continued Medical Work up               Expected Discharge Plan and Services     Post Acute Care Choice: Skilled Nursing Facility Living arrangements for the past 2 months: Skilled Nursing Facility [facility])                                       Social Drivers of Health (SDOH) Interventions SDOH Screenings   Food Insecurity: No Food Insecurity (11/30/2023)  Housing: High Risk (11/30/2023)  Transportation Needs: No Transportation Needs (11/30/2023)  Utilities: Not At Risk (11/30/2023)  Depression (PHQ2-9): Low Risk  (06/07/2021)  Financial Resource Strain: High Risk (11/21/2022)   Received from [facility]  Physical Activity: Insufficiently Active (05/03/2017)  Social Connections: Moderately Isolated (11/30/2023)  Stress: Stress Concern Present (11/20/2022)   Received from [facility]  Tobacco Use: Medium Risk (11/30/2023)    Readmission Risk Interventions    03/19/2023    9:58 AM 11/05/2022    2:59 PM 11/05/2022   12:16 PM  Readmission Risk Prevention Plan  Transportation Screening Complete Complete Complete  Medication Review Oceanographer) Complete Complete Complete  PCP or Specialist appointment within 3-5 days of discharge Complete Complete Complete  HRI or Home Care Consult Complete Complete Complete  SW Recovery Care/Counseling Consult Complete Complete Complete  Palliative Care Screening Complete Complete Complete  Skilled Nursing Facility Not  Applicable Not Applicable Not Applicable

## 2023-12-13 NOTE — Progress Notes (Signed)
 PT Cancellation Note  Patient Details Name: Gary [name] MRN: [id number] DOB: Apr 22, 1955   Cancelled Treatment:    Reason Eval/Treat Not Completed: Patient declined to participate with PT services one two occasions this PM despite max encouragement from multiple staff members.  Pt declined to give a reason why he was declining to participate with PT services or whether he had intentions of trying to work with PT at a future date.  Will attempt to see pt at a future date/time as medically appropriate.    CHARM Glendia Bertin PT, DPT 12/13/23, 3:01 PM

## 2023-12-14 DIAGNOSIS — J9622 Acute and chronic respiratory failure with hypercapnia: Secondary | ICD-10-CM | POA: Diagnosis not present

## 2023-12-14 DIAGNOSIS — J9621 Acute and chronic respiratory failure with hypoxia: Secondary | ICD-10-CM | POA: Diagnosis not present

## 2023-12-14 LAB — CBC WITH DIFFERENTIAL/PLATELET
Abs Immature Granulocytes: 0.17 K/uL — ABNORMAL HIGH (ref 0.00–0.07)
Basophils Absolute: 0 K/uL (ref 0.0–0.1)
Basophils Relative: 0 %
Eosinophils Absolute: 0.1 K/uL (ref 0.0–0.5)
Eosinophils Relative: 1 %
HCT: 30.4 % — ABNORMAL LOW (ref 39.0–52.0)
Hemoglobin: 8.1 g/dL — ABNORMAL LOW (ref 13.0–17.0)
Immature Granulocytes: 2 %
Lymphocytes Relative: 11 %
Lymphs Abs: 1 K/uL (ref 0.7–4.0)
MCH: 25 pg — ABNORMAL LOW (ref 26.0–34.0)
MCHC: 26.6 g/dL — ABNORMAL LOW (ref 30.0–36.0)
MCV: 93.8 fL (ref 80.0–100.0)
Monocytes Absolute: 1 K/uL (ref 0.1–1.0)
Monocytes Relative: 12 %
Neutro Abs: 6.7 K/uL (ref 1.7–7.7)
Neutrophils Relative %: 74 %
Platelets: 163 K/uL (ref 150–400)
RBC: 3.24 MIL/uL — ABNORMAL LOW (ref 4.22–5.81)
RDW: 20 % — ABNORMAL HIGH (ref 11.5–15.5)
WBC: 9 K/uL (ref 4.0–10.5)
nRBC: 1.8 % — ABNORMAL HIGH (ref 0.0–0.2)

## 2023-12-14 LAB — BASIC METABOLIC PANEL WITH GFR
Anion gap: 5 (ref 5–15)
BUN: 18 mg/dL (ref 8–23)
CO2: 35 mmol/L — ABNORMAL HIGH (ref 22–32)
Calcium: 7.7 mg/dL — ABNORMAL LOW (ref 8.9–10.3)
Chloride: 100 mmol/L (ref 98–111)
Creatinine, Ser: 1.48 mg/dL — ABNORMAL HIGH (ref 0.61–1.24)
GFR, Estimated: 52 mL/min — ABNORMAL LOW (ref >60)
Glucose, Bld: 94 mg/dL (ref 70–99)
Potassium: 4.4 mmol/L (ref 3.5–5.1)
Sodium: 140 mmol/L (ref 135–145)

## 2023-12-14 NOTE — Progress Notes (Signed)
 Pt's bipap alarming frequently for high leakage percentage and high respirations.  Readjusted mask several times, but continues to alarm.  Respiratory notified and they do not have a larger fitting mask.  Will continue to monitor SpO2 as leakage has no resolve.

## 2023-12-14 NOTE — Progress Notes (Signed)
 Late entry: Changed patient's machine to V60 from [employer] due to the continuous leak because mask is too small. V60's mask is slightly bigger than previous mask. Placed patient on AVAPS mode, provider has been notified of change. Venous gas order in am to make sure settings are appropriate for patient

## 2023-12-14 NOTE — TOC Progression Note (Addendum)
 Transition of Care [facility]) - Progression Note    Patient Details  Name: Gary [name] MRN: [id number] Date of Birth: December 17, 1955  Transition of Care [facility]) CM/SW Contact  [place] L Faiz Weber, [place] Phone Number: 12/14/2023, 3:07 PM  Clinical Narrative:     CSW was notified that patient insurance declined the SNF stay. A peer to peer review was completed and insurance denied the request again. Patient was at [facility] for one hour before returning to the hospital.   CSW attempted to call [place], [place] for [facility], to no avail. A voicemail message was left requesting a return call.   CSW contacted patient sister and left a voicemail message requesting a return call.   CSW attempted to call patient in his room. Patient did not pick up.   Patient has to return to [facility].   4:30: CSW attempted to see patient to discuss discharge planning. No family at bedside. Patient was asleep. CSW attempted to wake him to no avail.   Expected Discharge Plan: Skilled Nursing Facility Barriers to Discharge: Continued Medical Work up               Expected Discharge Plan and Services     Post Acute Care Choice: Skilled Nursing Facility Living arrangements for the past 2 months: Skilled Nursing Facility [facility])                                       Social Drivers of Health (SDOH) Interventions SDOH Screenings   Food Insecurity: No Food Insecurity (11/30/2023)  Housing: High Risk (11/30/2023)  Transportation Needs: No Transportation Needs (11/30/2023)  Utilities: Not At Risk (11/30/2023)  Depression (PHQ2-9): Low Risk  (06/07/2021)  Financial Resource Strain: High Risk (11/21/2022)   Received from [facility]  Physical Activity: Insufficiently Active (05/03/2017)  Social Connections: Moderately Isolated (11/30/2023)  Stress: Stress Concern Present (11/20/2022)   Received from [facility]  Tobacco Use: Medium Risk (11/30/2023)    Readmission Risk Interventions    03/19/2023     9:58 AM 11/05/2022    2:59 PM 11/05/2022   12:16 PM  Readmission Risk Prevention Plan  Transportation Screening Complete Complete Complete  Medication Review Oceanographer) Complete Complete Complete  PCP or Specialist appointment within 3-5 days of discharge Complete Complete Complete  HRI or Home Care Consult Complete Complete Complete  SW Recovery Care/Counseling Consult Complete Complete Complete  Palliative Care Screening Complete Complete Complete  Skilled Nursing Facility Not Applicable Not Applicable Not Applicable

## 2023-12-14 NOTE — Progress Notes (Signed)
 Progress Note   Patient: Gary [name] FMW:[id number] DOB: [birth year] DOA: 11/28/2023     16 DOS: the patient was seen and examined on 12/14/2023    Brief hospital course: From HPI Gary MATSUO is a 68 y.o. 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 Patient  presents to ED  from  rehab facility in [place]. EMS was called due sat in 70's . Per notes patient uses Bipap but facility does not provide this therapy.    Patient has frequent multiple hospitalization most recent from 10/11/2023-10/26/23 with diagnosis of acute on chronic hypoxic and hypercapnic respiratory failure,sepsis secondary to Pseudomonas  Pneumonia complicated by  Pseudomonas bacteremia in addition to AKI on CKDIIIa.  Of note s/p hospitalization patient was discharged to [facility] and thereafter he was discharged to SNF.    Patient was found to be with acute on chronic hypoxic and hypercapnic respiratory failure and was placed on BiPAP.  Initial chest x-ray concerning for pulmonary vascular congestion so he was given a dose of IV Lasix . CT chest with worsening atelectatic changes in right upper lobe, and no other focal abnormality.  Labs with worsening anemia with hemoglobin at 7.6-anemia panel ordered.  Respiratory viral panel negative, BNP normal at 59.  BUN 35 and creatinine at 1.52 with baseline around 1.2-1.3-not meeting the criteria for AKI.       Assessment and Plan:   #LGIB  Likely diverticular  Patient with multiple maroon colored stools. He has had this in the past. During hospitalization 04/2023 it was thought to be related to diverticular bleed vs SCAD. Because patient had recent colonoscopy showing mod to severe colitis no further GI intervention was recommended at that time.  CTA  of the abd was performed without evidence of extravasation. Status post 2 units of blood transfusion No evidence of shock, lactate within normal limits.  Tagged red cell scan was negative for active GI bleeding  Coag studies WNL Continue to monitor CBC   # Acute on chronic respiratory failure with hypoxia and hypercapnia Resolving   #Likely OHS #metabolic alkalosis compensatory for Hypercapnic RF  Currently off BiPAP this morning patient has a chronic trach and uses 6 L of oxygen  at baseline.  - Improved back to baseline now Mildly elevated procalcitonin s/p 5-day course of Levaquin  -Continue with supplemental oxygen  during the day  Continue with BiPAP at night and while sleeping -Continue with supportive care     # Acute metabolic encephalopathy: Resolved Secondary to hypercapnia. - Currently off BiPAP   # COPD exacerbation:  -s/p steroid taper  Continue with bronchodilator and supportive care   # Acute on chronic HFpEF  Remains volume overloaded.  Renal function slightly worsened. .  Hold spironolactone , wean midodrine  as able.   Unna boots ordered     CKD stage 3a, GFR 45-59 ml/min ([name]) Continue to monitor renal function closely     Hypokalemia Resolved    H/o Chronic colitis - Continue home mesalamine , flex sig with GI showed no active colitis of L sided colon   Normocytic anemia Patient was transfused 1 unit of blood History of prior GI bleed. -s/p IV iron  x 1 dose -Maintain B12 above 400 -Continue oral Fe supplementation  -Monitor hemoglobin per above  -Tagged red blood cell scan if hgb  not improved.  -Transfuse hgb <7   Sleep apnea OHS - Continue BiPAP at night   Morbid obesity ([name]) Estimated body mass index is 53 kg/m as calculated from the following:   Height as of this encounter: 6' 4 (1.93 m).   Weight as of this encounter: 197.5 kg.  - This will complicate overall prognosis   Prolonged Qtc on tele. Continue to monitor.  Avoid qtc  prolonging medications.    Hypotension Resolving  Blood pressure stable remains soft.  -Continue home midodrine    Anxiety, Continue Atarax    Ambulatory dysfunction Patient currently here to make decision on placement   Diet: Soft per GI   DVT Prophylaxis: On SCD for now given GI bleed   Advance goals of care discussion: Full code   Family Communication: None at bedside   Disposition: Back to skilled nursing facility when medically stable   Subjective:  Patient refused treatment from respiratory therapy as well as physical therapy today I did peer to peer and spoke with patient's insurance company today and they have denied skilled nursing facility placement   Physical Exam: Constitutional: In no distress.  Trach capped.  Cardiovascular: Normal rate, regular rhythm. No lower extremity edema  Pulmonary: Non labored breathing on [place], no wheezing or rales.   Abdominal: Soft. Non distended and non tender Musculoskeletal: Normal range of motion.     Neurological: Alert and oriented to person, place, and time. Non focal  Skin: Skin is warm and dry.   Data Reviewed:   Vitals:   12/14/23 1155 12/14/23 1200 12/14/23 1352 12/14/23 1600  BP: 108/61 (!) 109/59 118/62 104/64  Pulse: 81 81 80 80  Resp: 18 (!) 21 (!) 23 (!) 22  Temp:  98.1 F (36.7 C)  97.8 F (36.6 C)  TempSrc:  Axillary  Axillary  SpO2: 94% 93% 94% 98%  Weight:      Height:          Latest Ref Rng & Units 12/14/2023    5:27 AM 12/13/2023    5:55 AM 12/12/2023    5:37 AM  BMP  Glucose 70 - 99 mg/dL 94  892  88   BUN 8 - 23 mg/dL 18  20  20    Creatinine 0.61 - 1.24 mg/dL 8.51  8.53  8.72   Sodium 135 - 145 mmol/L 140  142  144   Potassium 3.5 - 5.1 mmol/L 4.4  4.1  3.8   Chloride 98 - 111 mmol/L 100  101  103   CO2 22 - 32 mmol/L 35  36  35   Calcium  8.9 - 10.3 mg/dL 7.7  7.8  7.7      Author: [name], MD 12/14/2023 5:04 PM  For on call review [email or web address].

## 2023-12-14 NOTE — Plan of Care (Signed)
  Problem: Education: Goal: Knowledge of General Education information will improve Description: Including pain rating scale, medication(s)/side effects and non-pharmacologic comfort measures Outcome: Progressing   Problem: Clinical Measurements: Goal: Respiratory complications will improve Outcome: Progressing   Problem: Activity: Goal: Risk for activity intolerance will decrease Outcome: Progressing   Problem: Nutrition: Goal: Adequate nutrition will be maintained Outcome: Progressing   Problem: Coping: Goal: Level of anxiety will decrease Outcome: Progressing   Problem: Elimination: Goal: Will not experience complications related to bowel motility Outcome: Progressing   Problem: Pain Managment: Goal: General experience of comfort will improve and/or be controlled Outcome: Progressing   Problem: Safety: Goal: Ability to remain free from injury will improve Outcome: Progressing   Problem: Skin Integrity: Goal: Risk for impaired skin integrity will decrease Outcome: Progressing

## 2023-12-15 DIAGNOSIS — J9621 Acute and chronic respiratory failure with hypoxia: Secondary | ICD-10-CM | POA: Diagnosis not present

## 2023-12-15 DIAGNOSIS — J9622 Acute and chronic respiratory failure with hypercapnia: Secondary | ICD-10-CM | POA: Diagnosis not present

## 2023-12-15 LAB — BASIC METABOLIC PANEL WITH GFR
Anion gap: 6 (ref 5–15)
BUN: 17 mg/dL (ref 8–23)
CO2: 35 mmol/L — ABNORMAL HIGH (ref 22–32)
Calcium: 7.9 mg/dL — ABNORMAL LOW (ref 8.9–10.3)
Chloride: 100 mmol/L (ref 98–111)
Creatinine, Ser: 1.52 mg/dL — ABNORMAL HIGH (ref 0.61–1.24)
GFR, Estimated: 50 mL/min — ABNORMAL LOW (ref >60)
Glucose, Bld: 99 mg/dL (ref 70–99)
Potassium: 4.2 mmol/L (ref 3.5–5.1)
Sodium: 141 mmol/L (ref 135–145)

## 2023-12-15 LAB — CBC WITH DIFFERENTIAL/PLATELET
Abs Immature Granulocytes: 0.13 K/uL — ABNORMAL HIGH (ref 0.00–0.07)
Basophils Absolute: 0 K/uL (ref 0.0–0.1)
Basophils Relative: 0 %
Eosinophils Absolute: 0.1 K/uL (ref 0.0–0.5)
Eosinophils Relative: 1 %
HCT: 32 % — ABNORMAL LOW (ref 39.0–52.0)
Hemoglobin: 8.3 g/dL — ABNORMAL LOW (ref 13.0–17.0)
Immature Granulocytes: 2 %
Lymphocytes Relative: 15 %
Lymphs Abs: 1 K/uL (ref 0.7–4.0)
MCH: 24.7 pg — ABNORMAL LOW (ref 26.0–34.0)
MCHC: 25.9 g/dL — ABNORMAL LOW (ref 30.0–36.0)
MCV: 95.2 fL (ref 80.0–100.0)
Monocytes Absolute: 0.8 K/uL (ref 0.1–1.0)
Monocytes Relative: 12 %
Neutro Abs: 4.7 K/uL (ref 1.7–7.7)
Neutrophils Relative %: 70 %
Platelets: 193 K/uL (ref 150–400)
RBC: 3.36 MIL/uL — ABNORMAL LOW (ref 4.22–5.81)
RDW: 19.9 % — ABNORMAL HIGH (ref 11.5–15.5)
WBC: 6.8 K/uL (ref 4.0–10.5)
nRBC: 1.5 % — ABNORMAL HIGH (ref 0.0–0.2)

## 2023-12-15 LAB — BLOOD GAS, VENOUS
Acid-Base Excess: 6.9 mmol/L — ABNORMAL HIGH (ref 0.0–2.0)
Bicarbonate: 39 mmol/L — ABNORMAL HIGH (ref 20.0–28.0)
O2 Saturation: 66.5 %
Patient temperature: 37
pCO2, Ven: 102 mmHg (ref 44–60)
pH, Ven: 7.19 — CL (ref 7.25–7.43)
pO2, Ven: 39 mmHg (ref 32–45)

## 2023-12-15 NOTE — Progress Notes (Signed)
 Progress Note   Patient: Gary [name] FMW:[id number] DOB: 11/02/1955 DOA: 11/28/2023     17 DOS: the patient was seen and examined on 12/15/2023     Brief hospital course: From HPI ADARIAN BUR is a 68 y.o. 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 Patient  presents to ED  from  rehab facility in [place]. EMS was called due sat in 70's . Per notes patient uses Bipap but facility does not provide this therapy.    Patient has frequent multiple hospitalization most recent from 10/11/2023-10/26/23 with diagnosis of acute on chronic hypoxic and hypercapnic respiratory failure,sepsis secondary to Pseudomonas  Pneumonia complicated by  Pseudomonas bacteremia in addition to AKI on CKDIIIa.  Of note s/p hospitalization patient was discharged to [facility] and thereafter he was discharged to SNF.    Patient was found to be with acute on chronic hypoxic and hypercapnic respiratory failure and was placed on BiPAP.  Initial chest x-ray concerning for pulmonary vascular congestion so he was given a dose of IV Lasix . CT chest with worsening atelectatic changes in right upper lobe, and no other focal abnormality.  Labs with worsening anemia with hemoglobin at 7.6-anemia panel ordered.  Respiratory viral panel negative, BNP normal at 59.  BUN 35 and creatinine at 1.52 with baseline around 1.2-1.3-not meeting the criteria for AKI.       Assessment and Plan:   #LGIB  Likely diverticular  Patient with multiple maroon colored stools. He has had this in the past. During hospitalization 04/2023 it was thought to be related to diverticular bleed vs SCAD. Because patient had recent colonoscopy showing mod to severe colitis no further GI intervention was recommended at that time.  CTA  of the abd was performed without evidence of extravasation. Status post 2 units of blood transfusion No evidence of shock, lactate within normal limits.  Tagged red cell scan was negative for active GI bleeding  Coag studies WNL Continue to monitor CBC   # Acute on chronic respiratory failure with hypoxia and hypercapnia Resolving   #Likely OHS #metabolic alkalosis compensatory for Hypercapnic RF  Currently off BiPAP this morning patient has a chronic trach and uses 6 L of oxygen  at baseline.  - Improved back to baseline now Mildly elevated procalcitonin s/p 5-day course of Levaquin  -Continue with supplemental oxygen  during the day  Continue with BiPAP at night and while sleeping -Continue with supportive care     # Acute metabolic encephalopathy: Resolved Secondary to hypercapnia. - Currently off BiPAP   # COPD exacerbation:  Has completed steroid taper Continue with bronchodilator and supportive care   # Acute on chronic HFpEF  Remains volume overloaded.  Renal function slightly worsened. .  Hold spironolactone , wean midodrine  as able.   Unna boots ordered     CKD stage 3a, GFR 45-59 ml/min ([name]) Continue to monitor renal function closely     Hypokalemia-resolved    H/o Chronic colitis - Continue home mesalamine , flex sig with GI showed no active colitis of L sided colon   Normocytic anemia Patient was transfused 1 unit of blood History of prior GI bleed. -s/p IV iron  x 1 dose -Maintain B12 above 400 -Continue oral Fe supplementation  -Monitor hemoglobin per above  -Tagged red blood cell scan if hgb  not improved.  -Transfuse hgb <7   Sleep apnea OHS - Continue BiPAP at night   Morbid obesity ([name]) Estimated body mass index is 53 kg/m as calculated from the following:   Height as of this encounter: 6' 4 (1.93 m).   Weight as of this encounter: 197.5 kg.  - This will complicate overall prognosis   Prolonged Qtc on tele. Continue to monitor.   Avoid qtc prolonging medications.    Hypotension Resolving  Blood pressure stable remains soft.  -Continue home midodrine    Anxiety, Continue Atarax    Ambulatory dysfunction Patient currently here to make decision on placement   Diet: Soft per GI   DVT Prophylaxis: On SCD for now given GI bleed   Advance goals of care discussion: Full code   Family Communication: None at bedside   Disposition: Back to skilled nursing facility when medically stable   Subjective:  I have discussed with patient concerning the outcome of peer to peer review He has agreed to work with PT OT as well as respiratory therapy He denies nausea vomiting chest pain cough   Physical Exam: Constitutional: In no distress.  Trach capped.  Cardiovascular: Normal rate, regular rhythm. No lower extremity edema  Pulmonary: Non labored breathing on [place], no wheezing or rales.   Abdominal: Soft. Non distended and non tender Musculoskeletal: Normal range of motion.     Neurological: Alert and oriented to person, place, and time. Non focal  Skin: Skin is warm and dry.    Data Reviewed:      Latest Ref Rng & Units 12/15/2023    7:17 AM 12/14/2023    5:27 AM 12/13/2023    5:55 AM  BMP  Glucose 70 - 99 mg/dL 99  94  892   BUN 8 - 23 mg/dL 17  18  20    Creatinine 0.61 - 1.24 mg/dL 8.47  8.51  8.53   Sodium 135 - 145 mmol/L 141  140  142   Potassium 3.5 - 5.1 mmol/L 4.2  4.4  4.1   Chloride 98 - 111 mmol/L 100  100  101   CO2 22 - 32 mmol/L 35  35  36   Calcium  8.9 - 10.3 mg/dL 7.9  7.7  7.8          Latest Ref Rng & Units 12/15/2023    7:17 AM 12/14/2023    5:27 AM 12/13/2023    5:55 AM  CBC  WBC 4.0 - 10.5 K/uL 6.8  9.0  8.5   Hemoglobin 13.0 - 17.0 g/dL 8.3  8.1  8.7   Hematocrit 39.0 - 52.0 % 32.0  30.4  32.7   Platelets 150 - 400 K/uL 193  163  165       Vitals:   12/15/23 0010 12/15/23 0500 12/15/23 0833 12/15/23 1200  BP: (!) 99/52 (!) 106/59 118/71 113/65  Pulse:  71 61 74  Resp: 17 (!) 21  18 (!) 22  Temp: 97.9 F (36.6 C) 97.6 F (36.4 C) 98.5 F (36.9 C) 98.1 F (36.7 C)  TempSrc: Oral Oral Oral Oral  SpO2: 100% 100% 95% (!) 81%  Weight:      Height:         Author: [name], MD 12/15/2023 2:52 PM  For on call review [email or web address].

## 2023-12-15 NOTE — Plan of Care (Signed)
   Problem: Education: Goal: Knowledge of General Education information will improve Description: Including pain rating scale, medication(s)/side effects and non-pharmacologic comfort measures Outcome: Progressing   Problem: Nutrition: Goal: Adequate nutrition will be maintained Outcome: Progressing

## 2023-12-16 DIAGNOSIS — J9622 Acute and chronic respiratory failure with hypercapnia: Secondary | ICD-10-CM | POA: Diagnosis not present

## 2023-12-16 DIAGNOSIS — J9621 Acute and chronic respiratory failure with hypoxia: Secondary | ICD-10-CM | POA: Diagnosis not present

## 2023-12-16 LAB — BASIC METABOLIC PANEL WITH GFR
Anion gap: 7 (ref 5–15)
BUN: 19 mg/dL (ref 8–23)
CO2: 34 mmol/L — ABNORMAL HIGH (ref 22–32)
Calcium: 7.9 mg/dL — ABNORMAL LOW (ref 8.9–10.3)
Chloride: 100 mmol/L (ref 98–111)
Creatinine, Ser: 1.39 mg/dL — ABNORMAL HIGH (ref 0.61–1.24)
GFR, Estimated: 56 mL/min — ABNORMAL LOW (ref >60)
Glucose, Bld: 74 mg/dL (ref 70–99)
Potassium: 3.9 mmol/L (ref 3.5–5.1)
Sodium: 141 mmol/L (ref 135–145)

## 2023-12-16 LAB — CBC WITH DIFFERENTIAL/PLATELET
Abs Immature Granulocytes: 0.06 K/uL (ref 0.00–0.07)
Basophils Absolute: 0 K/uL (ref 0.0–0.1)
Basophils Relative: 0 %
Eosinophils Absolute: 0.1 K/uL (ref 0.0–0.5)
Eosinophils Relative: 2 %
HCT: 29.7 % — ABNORMAL LOW (ref 39.0–52.0)
Hemoglobin: 7.8 g/dL — ABNORMAL LOW (ref 13.0–17.0)
Immature Granulocytes: 1 %
Lymphocytes Relative: 14 %
Lymphs Abs: 0.9 K/uL (ref 0.7–4.0)
MCH: 24.3 pg — ABNORMAL LOW (ref 26.0–34.0)
MCHC: 26.3 g/dL — ABNORMAL LOW (ref 30.0–36.0)
MCV: 92.5 fL (ref 80.0–100.0)
Monocytes Absolute: 0.7 K/uL (ref 0.1–1.0)
Monocytes Relative: 11 %
Neutro Abs: 4.7 K/uL (ref 1.7–7.7)
Neutrophils Relative %: 72 %
Platelets: 176 K/uL (ref 150–400)
RBC: 3.21 MIL/uL — ABNORMAL LOW (ref 4.22–5.81)
RDW: 19.6 % — ABNORMAL HIGH (ref 11.5–15.5)
WBC: 6.5 K/uL (ref 4.0–10.5)
nRBC: 1.7 % — ABNORMAL HIGH (ref 0.0–0.2)

## 2023-12-16 NOTE — Plan of Care (Signed)
  Problem: Education: Goal: Knowledge of General Education information will improve Description: Including pain rating scale, medication(s)/side effects and non-pharmacologic comfort measures Outcome: Progressing   Problem: Health Behavior/Discharge Planning: Goal: Ability to manage health-related needs will improve Outcome: Progressing   Problem: Clinical Measurements: Goal: Ability to maintain clinical measurements within normal limits will improve Outcome: Progressing Goal: Will remain free from infection Outcome: Progressing Goal: Diagnostic test results will improve Outcome: Progressing Goal: Respiratory complications will improve Outcome: Progressing Goal: Cardiovascular complication will be avoided Outcome: Progressing   Problem: Activity: Goal: Risk for activity intolerance will decrease Outcome: Progressing   Problem: Nutrition: Goal: Adequate nutrition will be maintained Outcome: Progressing   Problem: Coping: Goal: Level of anxiety will decrease Outcome: Progressing   Problem: Elimination: Goal: Will not experience complications related to bowel motility Outcome: Progressing Goal: Will not experience complications related to urinary retention Outcome: Progressing   Problem: Pain Managment: Goal: General experience of comfort will improve and/or be controlled Outcome: Progressing   Problem: Safety: Goal: Ability to remain free from injury will improve Outcome: Progressing   Problem: Skin Integrity: Goal: Risk for impaired skin integrity will decrease Outcome: Progressing   Problem: Education: Goal: Knowledge of disease or condition will improve Outcome: Progressing Goal: Knowledge of the prescribed therapeutic regimen will improve Outcome: Progressing Goal: Individualized Educational Video(s) Outcome: Progressing   Problem: Activity: Goal: Ability to tolerate increased activity will improve Outcome: Progressing Goal: Will verbalize the  importance of balancing activity with adequate rest periods Outcome: Progressing   Problem: Respiratory: Goal: Ability to maintain a clear airway will improve Outcome: Progressing Goal: Levels of oxygenation will improve Outcome: Progressing Goal: Ability to maintain adequate ventilation will improve Outcome: Progressing

## 2023-12-16 NOTE — Progress Notes (Signed)
 Progress Note   Patient: Gary [name] FMW:[id number] DOB: 07-Feb-1956 DOA: 11/28/2023     18 DOS: the patient was seen and examined on 12/16/2023      Brief hospital course: From HPI Gary [name] is a 68 y.o. 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 Patient  presents to ED  from  rehab facility in [place]. EMS was called due sat in 70's . Per notes patient uses Bipap but facility does not provide this therapy.    Patient has frequent multiple hospitalization most recent from 10/11/2023-10/26/23 with diagnosis of acute on chronic hypoxic and hypercapnic respiratory failure,sepsis secondary to Pseudomonas  Pneumonia complicated by  Pseudomonas bacteremia in addition to AKI on CKDIIIa.  Of note s/p hospitalization patient was discharged to [facility] and thereafter he was discharged to SNF.    Patient was found to be with acute on chronic hypoxic and hypercapnic respiratory failure and was placed on BiPAP.  Initial chest x-ray concerning for pulmonary vascular congestion so he was given a dose of IV Lasix . CT chest with worsening atelectatic changes in right upper lobe, and no other focal abnormality.  Labs with worsening anemia with hemoglobin at 7.6-anemia panel ordered.  Respiratory viral panel negative, BNP normal at 59.  BUN 35 and creatinine at 1.52 with baseline around 1.2-1.3-not meeting the criteria for AKI.       Assessment and Plan:   #LGIB  Likely diverticular  Patient with multiple maroon colored stools. He has had this in the past. During hospitalization 04/2023 it was thought to be related to diverticular bleed vs SCAD. Because patient had recent colonoscopy showing mod to severe colitis no further GI intervention was recommended at that time.  CTA  of the abd was performed without evidence of extravasation. Status post 2 units of blood transfusion No evidence of shock, lactate within normal limits.  Tagged red cell scan was negative for active GI bleeding  Coag studies WNL Continue to monitor CBC   # Acute on chronic respiratory failure with hypoxia and hypercapnia Resolving   #Likely OHS #metabolic alkalosis compensatory for Hypercapnic RF  Currently off BiPAP this morning patient has a chronic trach and uses 6 L of oxygen  at baseline.  - Improved back to baseline now Mildly elevated procalcitonin s/p 5-day course of Levaquin  -Continue with supplemental oxygen  during the day  Continue with BiPAP at night and while sleeping -Continue with supportive care     # Acute metabolic encephalopathy: Resolved Secondary to hypercapnia. - Currently off BiPAP   # COPD exacerbation:  Has completed steroid taper Continue with bronchodilator and supportive care   # Acute on chronic HFpEF  Remains volume overloaded.  Renal function slightly worsened. .  Hold spironolactone , wean midodrine  as able.   Unna boots ordered     CKD stage 3a, GFR 45-59 ml/min ([name]) Continue to monitor renal function closely     Hypokalemia-resolved    H/o Chronic colitis - Continue home mesalamine , flex sig with GI showed no active colitis of L sided colon   Normocytic anemia Patient was transfused 1 unit of blood History of prior GI bleed. -s/p IV iron  x 1 dose -Maintain B12 above 400 -Continue oral Fe supplementation  -Monitor hemoglobin per above  -Tagged red blood cell scan if  hgb not improved.  -Transfuse hgb <7   Sleep apnea OHS - Continue BiPAP at night   Morbid obesity ([name]) Estimated body mass index is 53 kg/m as calculated from the following:   Height as of this encounter: 6' 4 (1.93 m).   Weight as of this encounter: 197.5 kg.  - This will complicate overall prognosis   Prolonged Qtc on tele. Continue to monitor.   Avoid qtc prolonging medications.    Hypotension Resolving  Blood pressure stable remains soft.  -Continue home midodrine    Anxiety, Continue Atarax    Ambulatory dysfunction Patient currently here to make decision on placement   Diet: Soft per GI   DVT Prophylaxis: On SCD for now given GI bleed   Advance goals of care discussion: Full code   Family Communication: None at bedside   Disposition: Back to skilled nursing facility when medically stable   Subjective:  Denies any nausea vomiting abdominal pain no chest pain TOC still working on placement   Physical Exam: Constitutional: In no distress.  Trach capped.  Cardiovascular: Normal rate, regular rhythm. No lower extremity edema  Pulmonary: Non labored breathing on [place], no wheezing or rales.   Abdominal: Soft. Non distended and non tender Musculoskeletal: Normal range of motion.     Neurological: Alert and oriented to person, place, and time. Non focal  Skin: Skin is warm and dry.     Data Reviewed:      Vitals:   12/16/23 0111 12/16/23 0345 12/16/23 0840 12/16/23 1200  BP: (!) 114/59 (!) 99/57 111/62 (!) 100/48  Pulse: 65 71 71 73  Resp: 14 20 18 20   Temp: 98.4 F (36.9 C) 98.5 F (36.9 C) 98.2 F (36.8 C) 98.2 F (36.8 C)  TempSrc: Axillary Axillary Oral Oral  SpO2: 97% 98% 99% 98%  Weight:      Height:          Latest Ref Rng & Units 12/16/2023    3:41 AM 12/15/2023    7:17 AM 12/14/2023    5:27 AM  CBC  WBC 4.0 - 10.5 K/uL 6.5  6.8  9.0   Hemoglobin 13.0 - 17.0 g/dL 7.8  8.3  8.1   Hematocrit 39.0 - 52.0 % 29.7  32.0  30.4   Platelets 150 - 400 K/uL 176  193  163        Latest Ref Rng & Units 12/16/2023    3:41 AM 12/15/2023    7:17 AM 12/14/2023    5:27 AM  BMP  Glucose 70 - 99 mg/dL 74  99  94   BUN 8 - 23 mg/dL 19  17  18    Creatinine 0.61 - 1.24 mg/dL 8.60  8.47  8.51   Sodium 135 - 145 mmol/L 141  141  140   Potassium 3.5 - 5.1 mmol/L 3.9  4.2  4.4   Chloride 98 - 111 mmol/L 100  100   100   CO2 22 - 32 mmol/L 34  35  35   Calcium  8.9 - 10.3 mg/dL 7.9  7.9  7.7       Author: [name], MD 12/16/2023 4:52 PM  For on call review [email or web address].

## 2023-12-16 NOTE — Progress Notes (Signed)
 Physical Therapy Treatment/Re-Evaluation Patient Details Name: Gary [name] MRN: [id number] DOB: October 03, 1955 Today's Date: 12/16/2023   History of Present Illness Pt is a 68 y.o. male with medical history significant of HFpEF, DVT, AAA, COPD, HTN, anemia, chronic hypoxic and hypercapnic respiratory failure on chronic trach on [place] 5-6L during the day and bipap at bedtime. Patient  presents to ED  from rehab facility in [place]. EMS was called due sat in 70's . Per notes patient uses Bipap but facility does not provide this therapy.  Patient was placed on NRB in the field with increase in sat to 89-90 %. Of note patient has interim history of hospitalization form 10/11/2023-10/26/23 with diagnosis of acute on chronic hypoxic and hypercapnic respiratory failure, sepsis secondary to Pseudomonas. MD assessment includes: acute on chronic respiratory failure with hypoxia and hypercapnia, acute metabolic encephalopathy, COPD exacerbation, acute on chronic heart failure with preserved ejection fraction, hypotension, and normocytic anemia.    PT Comments  Pt received upright in bed. Pt very lethargic. Requires 10-15 minutes of motivation and encouragement for minimal participation. Ultimately reliant on hand over hand cuing for UE/LE sequencing and maxA+2 to sit EOB for ~5 minutes at SBA. Reports need to return to supine. MaxA+2 to return to supine using bed features to assist scooting upright in bed.  Pt placed upright in chair position upon exit. More alert post PT session. D/c recs remain appropriate. Goals updated.    If plan is discharge home, recommend the following: A lot of help with bathing/dressing/bathroom;Assistance with cooking/housework;Direct supervision/assist for medications management;Assist for transportation;Help with stairs or ramp for entrance;Direct supervision/assist for financial management;Two people to help with walking and/or transfers   Can travel by private vehicle     No  Equipment  Recommendations  Other (comment) (TBD)    Recommendations for Other Services       Precautions / Restrictions Precautions Precautions: Fall Recall of Precautions/Restrictions: Impaired Restrictions Weight Bearing Restrictions Per Provider Order: No     Mobility  Bed Mobility Overal bed mobility: Needs Assistance       Supine to sit: Max assist, +2 for physical assistance, HOB elevated, Used rails     General bed mobility comments: multi modal cuing, assist with UE's and LE's Patient Response: Cooperative  Transfers                   General transfer comment: unable this date due to lethargy sitting    Ambulation/Gait                   Stairs             Wheelchair Mobility     Tilt Bed Tilt Bed Patient Response: Cooperative  Modified Rankin (Stroke Patients Only)       Balance Overall balance assessment: Needs assistance Sitting-balance support: Feet supported, Bilateral upper extremity supported Sitting balance-[name] Scale: Fair                                      Hotel manager: No apparent difficulties Factors Affecting Communication: Trach/intubated  Cognition Arousal: Lethargic     PT - Cognitive impairments: No apparent impairments                         Following commands: Intact      Cueing Cueing Techniques: Verbal cues  Exercises  General Comments General comments (skin integrity, edema, etc.): SPO2 ranging from 87-97% on 6 L/min via [place]      Pertinent Vitals/Pain Pain Assessment Pain Assessment: Faces Faces Pain Scale: No hurt    Home Living                          Prior Function            PT Goals (current goals can now be found in the care plan section) Acute Rehab PT Goals Patient Stated Goal: To get stronger and walk better PT Goal Formulation: With patient Time For Goal Achievement: 12/30/23 Potential to Achieve Goals:  Fair Progress towards PT goals: Not progressing toward goals - comment (multiple refusals, significant weakness compared to eval)    Frequency    Min 2X/week      PT Plan      Co-evaluation              AM-PAC PT 6 Clicks Mobility   Outcome Measure  Help needed turning from your back to your side while in a flat bed without using bedrails?: A Little Help needed moving from lying on your back to sitting on the side of a flat bed without using bedrails?: A Little Help needed moving to and from a bed to a chair (including a wheelchair)?: Total Help needed standing up from a chair using your arms (e.g., wheelchair or bedside chair)?: Total Help needed to walk in hospital room?: Total Help needed climbing 3-5 steps with a railing? : Total 6 Click Score: 10    End of Session Equipment Utilized During Treatment: Oxygen  Activity Tolerance: Patient tolerated treatment well Patient left: in bed;with call bell/phone within reach;with bed alarm set Nurse Communication: Mobility status PT Visit Diagnosis: Difficulty in walking, not elsewhere classified (R26.2);Muscle weakness (generalized) (M62.81);Pain     Time: 8641-8568 PT Time Calculation (min) (ACUTE ONLY): 33 min  Charges:    $Therapeutic Activity: 8-22 mins PT General Charges $$ ACUTE PT VISIT: 1 Visit                     Dorina HERO. Fairly IV, PT, DPT Physical Therapist- [place]  [facility] 12/16/2023, 3:20 PM

## 2023-12-17 DIAGNOSIS — J9622 Acute and chronic respiratory failure with hypercapnia: Secondary | ICD-10-CM | POA: Diagnosis not present

## 2023-12-17 DIAGNOSIS — J9621 Acute and chronic respiratory failure with hypoxia: Secondary | ICD-10-CM | POA: Diagnosis not present

## 2023-12-17 LAB — CBC WITH DIFFERENTIAL/PLATELET
Abs Immature Granulocytes: 0.06 K/uL (ref 0.00–0.07)
Basophils Absolute: 0 K/uL (ref 0.0–0.1)
Basophils Relative: 0 %
Eosinophils Absolute: 0.1 K/uL (ref 0.0–0.5)
Eosinophils Relative: 2 %
HCT: 31.1 % — ABNORMAL LOW (ref 39.0–52.0)
Hemoglobin: 8.4 g/dL — ABNORMAL LOW (ref 13.0–17.0)
Immature Granulocytes: 1 %
Lymphocytes Relative: 18 %
Lymphs Abs: 1 K/uL (ref 0.7–4.0)
MCH: 25 pg — ABNORMAL LOW (ref 26.0–34.0)
MCHC: 27 g/dL — ABNORMAL LOW (ref 30.0–36.0)
MCV: 92.6 fL (ref 80.0–100.0)
Monocytes Absolute: 0.6 K/uL (ref 0.1–1.0)
Monocytes Relative: 10 %
Neutro Abs: 3.8 K/uL (ref 1.7–7.7)
Neutrophils Relative %: 69 %
Platelets: 188 K/uL (ref 150–400)
RBC: 3.36 MIL/uL — ABNORMAL LOW (ref 4.22–5.81)
RDW: 19.9 % — ABNORMAL HIGH (ref 11.5–15.5)
WBC: 5.6 K/uL (ref 4.0–10.5)
nRBC: 1.6 % — ABNORMAL HIGH (ref 0.0–0.2)

## 2023-12-17 LAB — BASIC METABOLIC PANEL WITH GFR
Anion gap: 8 (ref 5–15)
BUN: 17 mg/dL (ref 8–23)
CO2: 35 mmol/L — ABNORMAL HIGH (ref 22–32)
Calcium: 7.9 mg/dL — ABNORMAL LOW (ref 8.9–10.3)
Chloride: 99 mmol/L (ref 98–111)
Creatinine, Ser: 1.23 mg/dL (ref 0.61–1.24)
GFR, Estimated: >60 mL/min (ref >60)
Glucose, Bld: 94 mg/dL (ref 70–99)
Potassium: 3.7 mmol/L (ref 3.5–5.1)
Sodium: 142 mmol/L (ref 135–145)

## 2023-12-17 MED ORDER — ORAL CARE MOUTH RINSE: 15.0000 mL | OROMUCOSAL | Status: DC | PRN

## 2023-12-17 NOTE — Plan of Care (Signed)
  Problem: Education: Goal: Knowledge of General Education information will improve Description: Including pain rating scale, medication(s)/side effects and non-pharmacologic comfort measures Outcome: Progressing   Problem: Health Behavior/Discharge Planning: Goal: Ability to manage health-related needs will improve Outcome: Progressing   Problem: Clinical Measurements: Goal: Ability to maintain clinical measurements within normal limits will improve Outcome: Progressing Goal: Will remain free from infection Outcome: Progressing Goal: Diagnostic test results will improve Outcome: Progressing Goal: Respiratory complications will improve Outcome: Progressing Goal: Cardiovascular complication will be avoided Outcome: Progressing   Problem: Activity: Goal: Risk for activity intolerance will decrease Outcome: Progressing   Problem: Nutrition: Goal: Adequate nutrition will be maintained Outcome: Progressing   Problem: Coping: Goal: Level of anxiety will decrease Outcome: Progressing   Problem: Elimination: Goal: Will not experience complications related to bowel motility Outcome: Progressing Goal: Will not experience complications related to urinary retention Outcome: Progressing   Problem: Pain Managment: Goal: General experience of comfort will improve and/or be controlled Outcome: Progressing   Problem: Safety: Goal: Ability to remain free from injury will improve Outcome: Progressing   Problem: Skin Integrity: Goal: Risk for impaired skin integrity will decrease Outcome: Progressing   Problem: Education: Goal: Knowledge of disease or condition will improve Outcome: Progressing Goal: Knowledge of the prescribed therapeutic regimen will improve Outcome: Progressing Goal: Individualized Educational Video(s) Outcome: Progressing   Problem: Activity: Goal: Ability to tolerate increased activity will improve Outcome: Progressing Goal: Will verbalize the  importance of balancing activity with adequate rest periods Outcome: Progressing   Problem: Respiratory: Goal: Ability to maintain a clear airway will improve Outcome: Progressing Goal: Levels of oxygenation will improve Outcome: Progressing Goal: Ability to maintain adequate ventilation will improve Outcome: Progressing

## 2023-12-17 NOTE — Progress Notes (Signed)
 Progress Note   Patient: Gary [name] FMW:[id number] DOB: 1955/10/16 DOA: 11/28/2023     19 DOS: the patient was seen and examined on 12/17/2023    Brief hospital course: From HPI Gary [name] is a 68 y.o. male with medical history significant of heart failure preserved ejection fraction, hx of  DVT s/p treatment , AAA, COPD, hypertension, anemia, chronic hypoxic and hypercapnic respiratory failure on chronic trach (Shiley XLT #7 ) on [place] 5-6L during the day and bipap at bedtime. Patient  presents to ED  from  rehab facility in [place]. EMS was called due sat in 70's . Per notes patient uses Bipap but facility does not provide this therapy.    Patient has frequent multiple hospitalization most recent from 10/11/2023-10/26/23 with diagnosis of acute on chronic hypoxic and hypercapnic respiratory failure,sepsis secondary to Pseudomonas  Pneumonia complicated by  Pseudomonas bacteremia in addition to AKI on CKDIIIa.  Of note s/p hospitalization patient was discharged to [facility] and thereafter he was discharged to SNF.    Patient was found to be with acute on chronic hypoxic and hypercapnic respiratory failure and was placed on BiPAP.  Initial chest x-ray concerning for pulmonary vascular congestion so he was given a dose of IV Lasix . CT chest with worsening atelectatic changes in right upper lobe, and no other focal abnormality.  Labs with worsening anemia with hemoglobin at 7.6-anemia panel ordered.  Respiratory viral panel negative, BNP normal at 59.  BUN 35 and creatinine at 1.52 with baseline around 1.2-1.3-not meeting the criteria for AKI.       Assessment and Plan:   #LGIB  Likely diverticular  Patient with multiple maroon colored stools. He has had this in the past. During hospitalization 04/2023 it was thought to be related to diverticular bleed vs SCAD. Because patient had recent colonoscopy showing mod to severe colitis no further GI intervention was recommended at that time.  CTA of  the abd was performed without evidence of extravasation. Status post 2 units of blood transfusion No evidence of shock, lactate within normal limits.  Tagged red cell scan was negative for active GI bleeding  Coag studies WNL No complaints of GI bleed at the moment Continue to monitor CBC   # Acute on chronic respiratory failure with hypoxia and hypercapnia Resolving   #Likely OHS #metabolic alkalosis compensatory for Hypercapnic RF  Currently off BiPAP, patient has a chronic trach and uses 6 L of oxygen  at baseline.  - Improved back to baseline now Mildly elevated procalcitonin s/p 5-day course of Levaquin  -Continue with supplemental oxygen  during the day  Continue with BiPAP at night and while sleeping -Continue with supportive care     # Acute metabolic encephalopathy: Resolved Secondary to hypercapnia. - Currently off BiPAP   # COPD exacerbation:  Has completed steroid taper Continue with bronchodilator and supportive care   # Acute on chronic HFpEF  Remains volume overloaded.  Renal function slightly worsened. .  Hold spironolactone , wean midodrine  as able.      CKD stage 3a, GFR 45-59 ml/min ([name]) Continue to monitor renal function closely     Hypokalemia-resolved    H/o Chronic colitis - Continue home mesalamine  Flexible sigmoidoscopy with GI showed no active colitis of L sided colon   Normocytic anemia S/p transfusion History of prior GI bleed. -s/p IV iron  x 1 dose -Maintain B12 above 400 -Continue oral Fe supplementation  -Monitor hemoglobin per above  -Tagged red blood cell scan if concerns of GI bleed -Transfuse  hgb <7   Sleep apnea OHS - Continue BiPAP at night   Morbid obesity ([name]) Estimated body mass index is 53 kg/m as calculated from the following:   Height as of this encounter: 6' 4 (1.93 m).   Weight as of this encounter: 197.5 kg.  - This will complicate overall prognosis   Prolonged Qtc on tele. Continue to monitor.  Avoid  qtc prolonging medications.    Hypotension Resolving  Blood pressure stable remains soft.  -Continue home midodrine    Anxiety, Continue Atarax    Ambulatory dysfunction Patient currently here to make decision on placement   Diet: Soft per GI   DVT Prophylaxis: On SCD for now given GI bleed   Advance goals of care discussion: Full code   Family Communication: None at bedside   Disposition: Back to skilled nursing facility when available Patient's insurance denied his authorization to SNF   Subjective:  Denies any nausea vomiting abdominal pain no chest pain TOC still working on placement   Physical Exam: Constitutional: In no distress.  Trach capped.  Cardiovascular: Normal rate, regular rhythm. No lower extremity edema  Pulmonary: Non labored breathing on [place], no wheezing or rales.   Abdominal: Soft. Non distended and non tender Musculoskeletal: Normal range of motion.     Neurological: Alert and oriented to person, place, and time. Non focal  Skin: Skin is warm and dry.     Data Reviewed:  Vitals:   12/17/23 1500 12/17/23 1510 12/17/23 1520 12/17/23 1600  BP:   103/63 125/66  Pulse:    78  Resp: (!) 21 (!) 31 (!) 32 (!) 27  Temp:   (!) 97.4 F (36.3 C)   TempSrc:   Axillary   SpO2:    98%  Weight:      Height:          Latest Ref Rng & Units 12/17/2023    4:46 AM 12/16/2023    3:41 AM 12/15/2023    7:17 AM  CBC  WBC 4.0 - 10.5 K/uL 5.6  6.5  6.8   Hemoglobin 13.0 - 17.0 g/dL 8.4  7.8  8.3   Hematocrit 39.0 - 52.0 % 31.1  29.7  32.0   Platelets 150 - 400 K/uL 188  176  193        Latest Ref Rng & Units 12/17/2023    4:46 AM 12/16/2023    3:41 AM 12/15/2023    7:17 AM  BMP  Glucose 70 - 99 mg/dL 94  74  99   BUN 8 - 23 mg/dL 17  19  17    Creatinine 0.61 - 1.24 mg/dL 8.76  8.60  8.47   Sodium 135 - 145 mmol/L 142  141  141   Potassium 3.5 - 5.1 mmol/L 3.7  3.9  4.2   Chloride 98 - 111 mmol/L 99  100  100   CO2 22 - 32 mmol/L 35  34  35   Calcium  8.9 - 10.3  mg/dL 7.9  7.9  7.9      Author: [name], MD 12/17/2023 4:48 PM  For on call review [email or web address].

## 2023-12-17 NOTE — Progress Notes (Signed)
 Occupational Therapy Re-evaluation Patient Details Name: Gary [name] MRN: [id number] DOB: 01-08-1956 Today's Date: 12/17/2023   History of present illness Pt is a 68 y.o. male with medical history significant of HFpEF, DVT, AAA, COPD, HTN, anemia, chronic hypoxic and hypercapnic respiratory failure on chronic trach on [place] 5-6L during the day and bipap at bedtime. Patient  presents to ED  from rehab facility in [place]. EMS was called due sat in 70's . Per notes patient uses Bipap but facility does not provide this therapy.  Patient was placed on NRB in the field with increase in sat to 89-90 %. Of note patient has interim history of hospitalization form 10/11/2023-10/26/23 with diagnosis of acute on chronic hypoxic and hypercapnic respiratory failure, sepsis secondary to Pseudomonas. MD assessment includes: acute on chronic respiratory failure with hypoxia and hypercapnia, acute metabolic encephalopathy, COPD exacerbation, acute on chronic heart failure with preserved ejection fraction, hypotension, and normocytic anemia.   OT comments  Chart reviewed to date, pt greeted in bed, reports he is sick and weak. Provided two attempts at mobility attempts on this date. Limited re-evaluation completed with goals updated to reflect current level of function. Pt requires increased time for one step directions with limited insight into functional performance on this date. Provided red/yellow theraband for UE strengthening and education re importance of continued attempts at mobility. Pt is left in bed, all needs met. OT will follow.       If plan is discharge home, recommend the following:  A lot of help with walking and/or transfers;A lot of help with bathing/dressing/bathroom;Assistance with cooking/housework;Assistance with feeding;Assist for transportation;Help with stairs or ramp for entrance   Equipment Recommendations  Other (comment) (defer)    Recommendations for Other Services      Precautions /  Restrictions Precautions Precautions: Fall Recall of Precautions/Restrictions: Impaired Restrictions Weight Bearing Restrictions Per Provider Order: No       Mobility Bed Mobility               General bed mobility comments: boost in bed with CGA- pt uses bed features to assist    Transfers                   General transfer comment: deferred- pt declined     Balance                                           ADL either performed or assessed with clinical judgement   ADL Overall ADL's : Needs assistance/impaired                                       General ADL Comments: MAX-TOTAL A for LB dressing    Extremity/Trunk Assessment Upper Extremity Assessment Upper Extremity Assessment: Generalized weakness   Lower Extremity Assessment Lower Extremity Assessment: Generalized weakness        Vision Patient Visual Report: No change from baseline     Perception     Praxis     Communication Communication Communication: No apparent difficulties (has a chronic trach- it is capped)   Cognition Arousal: Lethargic Behavior During Therapy: Flat affect Cognition: Cognition impaired           Executive functioning impairment (select all impairments): Problem solving, Reasoning OT - Cognition Comments: pt is  lethargic with increased time for processing, simple direction following. Limited insight right now despite knowing role of therapy- states he is weak and sick and cannot do anything                 Following commands: Impaired Following commands impaired: Follows one step commands with increased time      Cueing   Cueing Techniques: Verbal cues, Tactile cues, Visual cues  Exercises Other Exercises Other Exercises: edu re role of OT, role of rehab, importance of continued participation in therapy Other Exercises: provided both red and yellow theraband    Shoulder Instructions       General Comments  spo2 >90%    Pertinent Vitals/ Pain       Pain Assessment Pain Assessment: No/denies pain  Home Living                                          Prior Functioning/Environment              Frequency  Min 2X/week        Progress Toward Goals  OT Goals(current goals can now be found in the care plan section)  Progress towards OT goals: Progressing toward goals  Acute Rehab OT Goals Time For Goal Achievement: 12/31/23 ADL Goals Pt Will Perform Grooming: with set-up;sitting Pt Will Perform Lower Body Dressing: with mod assist;sitting/lateral leans Pt Will Transfer to Toilet: with mod assist;bedside commode Pt Will Perform Toileting - Clothing Manipulation and hygiene: with min assist;sit to/from stand  Plan      Co-evaluation                 AM-PAC OT 6 Clicks Daily Activity     Outcome Measure   Help from another person eating meals?: None Help from another person taking care of personal grooming?: A Little Help from another person toileting, which includes using toliet, bedpan, or urinal?: A Little Help from another person bathing (including washing, rinsing, drying)?: A Lot Help from another person to put on and taking off regular upper body clothing?: A Lot Help from another person to put on and taking off regular lower body clothing?: Total 6 Click Score: 15    End of Session Equipment Utilized During Treatment: Oxygen   OT Visit Diagnosis: Unsteadiness on feet (R26.81);Muscle weakness (generalized) (M62.81)   Activity Tolerance Patient tolerated treatment well   Patient Left in bed;with call bell/phone within reach;with bed alarm set   Nurse Communication Mobility status        Time: 8545-8484 OT Time Calculation (min): 21 min  Charges: OT General Charges $OT Visit: 1 Visit OT Evaluation $OT Re-eval: 1 Re-eval OT Treatments $Therapeutic Exercise: 8-22 mins  Therisa Sheffield, OTD OTR/L  12/17/23, 4:17 PM

## 2023-12-17 NOTE — TOC Progression Note (Signed)
 Transition of Care [facility]) - Progression Note    Patient Details  Name: Gary [name] MRN: [id number] Date of Birth: 04/15/1956  Transition of Care [facility]) CM/SW Contact  Corean ONEIDA Haddock, RN Phone Number: 12/17/2023, 4:06 PM  Clinical Narrative:     Insurance shara was denied for [facility]. Per Tammy at [place], [place] with [place] will have to do a financial screen.  Dawn to reach out to patient and family   Expected Discharge Plan: Skilled Nursing Facility Barriers to Discharge: No SNF bed               Expected Discharge Plan and Services     Post Acute Care Choice: Skilled Nursing Facility Living arrangements for the past 2 months: Skilled Nursing Facility [facility])                                       Social Drivers of Health (SDOH) Interventions SDOH Screenings   Food Insecurity: No Food Insecurity (11/30/2023)  Housing: High Risk (11/30/2023)  Transportation Needs: No Transportation Needs (11/30/2023)  Utilities: Not At Risk (11/30/2023)  Depression (PHQ2-9): Low Risk  (06/07/2021)  Financial Resource Strain: High Risk (11/21/2022)   [facility] from [facility]  Physical Activity: Insufficiently Active (05/03/2017)  Social Connections: Moderately Isolated (11/30/2023)  Stress: Stress Concern Present (11/20/2022)   Received from [facility]  Tobacco Use: Medium Risk (11/30/2023)    Readmission Risk Interventions    03/19/2023    9:58 AM 11/05/2022    2:59 PM 11/05/2022   12:16 PM  Readmission Risk Prevention Plan  Transportation Screening Complete Complete Complete  Medication Review Oceanographer) Complete Complete Complete  PCP or Specialist appointment within 3-5 days of discharge Complete Complete Complete  HRI or Home Care Consult Complete Complete Complete  SW Recovery Care/Counseling Consult Complete Complete Complete  Palliative Care Screening Complete Complete Complete  Skilled Nursing Facility Not Applicable Not Applicable Not  Applicable

## 2023-12-18 DIAGNOSIS — J9621 Acute and chronic respiratory failure with hypoxia: Secondary | ICD-10-CM | POA: Diagnosis not present

## 2023-12-18 DIAGNOSIS — J9622 Acute and chronic respiratory failure with hypercapnia: Secondary | ICD-10-CM | POA: Diagnosis not present

## 2023-12-18 DIAGNOSIS — I5033 Acute on chronic diastolic (congestive) heart failure: Secondary | ICD-10-CM | POA: Diagnosis not present

## 2023-12-18 DIAGNOSIS — J441 Chronic obstructive pulmonary disease with (acute) exacerbation: Secondary | ICD-10-CM | POA: Diagnosis not present

## 2023-12-18 LAB — CBC WITH DIFFERENTIAL/PLATELET
Abs Immature Granulocytes: 0.04 K/uL (ref 0.00–0.07)
Basophils Absolute: 0 K/uL (ref 0.0–0.1)
Basophils Relative: 0 %
Eosinophils Absolute: 0.1 K/uL (ref 0.0–0.5)
Eosinophils Relative: 2 %
HCT: 29.3 % — ABNORMAL LOW (ref 39.0–52.0)
Hemoglobin: 7.9 g/dL — ABNORMAL LOW (ref 13.0–17.0)
Immature Granulocytes: 1 %
Lymphocytes Relative: 15 %
Lymphs Abs: 0.9 K/uL (ref 0.7–4.0)
MCH: 24.7 pg — ABNORMAL LOW (ref 26.0–34.0)
MCHC: 27 g/dL — ABNORMAL LOW (ref 30.0–36.0)
MCV: 91.6 fL (ref 80.0–100.0)
Monocytes Absolute: 0.6 K/uL (ref 0.1–1.0)
Monocytes Relative: 10 %
Neutro Abs: 4.1 K/uL (ref 1.7–7.7)
Neutrophils Relative %: 72 %
Platelets: 186 K/uL (ref 150–400)
RBC: 3.2 MIL/uL — ABNORMAL LOW (ref 4.22–5.81)
RDW: 19.6 % — ABNORMAL HIGH (ref 11.5–15.5)
WBC: 5.7 K/uL (ref 4.0–10.5)
nRBC: 1.8 % — ABNORMAL HIGH (ref 0.0–0.2)

## 2023-12-18 LAB — BASIC METABOLIC PANEL WITH GFR
Anion gap: 7 (ref 5–15)
BUN: 18 mg/dL (ref 8–23)
CO2: 35 mmol/L — ABNORMAL HIGH (ref 22–32)
Calcium: 8 mg/dL — ABNORMAL LOW (ref 8.9–10.3)
Chloride: 101 mmol/L (ref 98–111)
Creatinine, Ser: 1.33 mg/dL — ABNORMAL HIGH (ref 0.61–1.24)
GFR, Estimated: 59 mL/min — ABNORMAL LOW (ref >60)
Glucose, Bld: 78 mg/dL (ref 70–99)
Potassium: 4 mmol/L (ref 3.5–5.1)
Sodium: 143 mmol/L (ref 135–145)

## 2023-12-18 MED ORDER — SPIRONOLACTONE 25 MG PO TABS
25.0000 mg | ORAL_TABLET | Freq: Every day | ORAL | Status: DC
  Administered 2023-12-18 – 2023-12-19 (×2): 25 mg via ORAL
  Filled 2023-12-18 (×2): qty 1

## 2023-12-18 MED ORDER — TORSEMIDE 20 MG PO TABS
40.0000 mg | ORAL_TABLET | Freq: Two times a day (BID) | ORAL | Status: DC
  Administered 2023-12-18 – 2023-12-19 (×2): 40 mg via ORAL
  Filled 2023-12-18 (×2): qty 2

## 2023-12-18 NOTE — Progress Notes (Signed)
 Occupational Therapy Treatment Patient Details Name: COLLEEN DONAHOE MRN: [id number] DOB: [birth year] Today's Date: 12/18/2023   History of present illness Pt is a 68 y.o. male with medical history significant of HFpEF, DVT, AAA, COPD, HTN, anemia, chronic hypoxic and hypercapnic respiratory failure on chronic trach on [place] 5-6L during the day and bipap at bedtime. Patient  presents to ED  from rehab facility in [place]. EMS was called due sat in 70's . Per notes patient uses Bipap but facility does not provide this therapy.  Patient was placed on NRB in the field with increase in sat to 89-90 %. Of note patient has interim history of hospitalization form 10/11/2023-10/26/23 with diagnosis of acute on chronic hypoxic and hypercapnic respiratory failure, sepsis secondary to Pseudomonas. MD assessment includes: acute on chronic respiratory failure with hypoxia and hypercapnia, acute metabolic encephalopathy, COPD exacerbation, acute on chronic heart failure with preserved ejection fraction, hypotension, and normocytic anemia.   OT comments  Mr Benegas was seen for OT treatment on this date. Upon arrival to room pt in bed, agreeable to tx. Pt requires MAX A pericare bed level. MOD A for all bed mobility - rolling and sup<>sit. Tolerated ~8 min static sitting. Reports dizziness with all mobility, max RR 40 with initial sitting. SBA don/doff gown in sitting. Completed bed level exercises with red thereband (chest press, horiz abduction, albow flex/ext). Pt initially agreeable to OOB to chair however reports dizziness/fatigue limiting - will attmept next session. Pt making good progress toward goals, will continue to follow POC. Discharge recommendation remains appropriate.        If plan is discharge home, recommend the following:  A lot of help with walking and/or transfers;A lot of help with bathing/dressing/bathroom;Assistance with cooking/housework;Assistance with feeding;Assist for transportation;Help with  stairs or ramp for entrance   Equipment Recommendations  Other (comment) (defer)    Recommendations for Other Services      Precautions / Restrictions Precautions Precautions: Fall Restrictions Weight Bearing Restrictions Per Provider Order: No       Mobility Bed Mobility Overal bed mobility: Needs Assistance Bed Mobility: Rolling, Supine to Sit, Sit to Supine Rolling: Mod assist   Supine to sit: Mod assist Sit to supine: Mod assist        Transfers                   General transfer comment: pt deferred citing fatigue and BLE swelling/weakness with fear of falling     Balance Overall balance assessment: Needs assistance Sitting-balance support: Feet supported, Single extremity supported Sitting balance-[name] Scale: Fair                                     ADL either performed or assessed with clinical judgement   ADL Overall ADL's : Needs assistance/impaired                                       General ADL Comments: MAX A pericare bed level. SBA don/doff gown in sitting     Communication Communication Communication: No apparent difficulties   Cognition Arousal: Alert Behavior During Therapy: WFL for tasks assessed/performed, Anxious Cognition: Cognition impaired           Executive functioning impairment (select all impairments): Reasoning  Following commands: Intact        Cueing      Exercises      Shoulder Instructions       General Comments      Pertinent Vitals/ Pain       Pain Assessment Pain Assessment: No/denies pain   Frequency  Min 2X/week        Progress Toward Goals  OT Goals(current goals can now be found in the care plan section)  Progress towards OT goals: Progressing toward goals  Acute Rehab OT Goals OT Goal Formulation: With patient Time For Goal Achievement: 12/31/23 Potential to Achieve Goals: Fair ADL Goals Pt Will Perform Grooming:  with set-up;sitting Pt Will Perform Lower Body Dressing: with mod assist;sitting/lateral leans Pt Will Transfer to Toilet: with mod assist;bedside commode Pt Will Perform Toileting - Clothing Manipulation and hygiene: with min assist;sit to/from stand  Plan      Co-evaluation                 AM-PAC OT 6 Clicks Daily Activity     Outcome Measure   Help from another person eating meals?: None Help from another person taking care of personal grooming?: A Little Help from another person toileting, which includes using toliet, bedpan, or urinal?: A Little Help from another person bathing (including washing, rinsing, drying)?: A Lot Help from another person to put on and taking off regular upper body clothing?: A Lot Help from another person to put on and taking off regular lower body clothing?: A Lot 6 Click Score: 16    End of Session Equipment Utilized During Treatment: Oxygen   OT Visit Diagnosis: Unsteadiness on feet (R26.81);Muscle weakness (generalized) (M62.81)   Activity Tolerance Patient tolerated treatment well   Patient Left in bed;with call bell/phone within reach;with nursing/sitter in room   Nurse Communication Mobility status        Time: 8653-8565 OT Time Calculation (min): 48 min  Charges: OT General Charges $OT Visit: 1 Visit OT Treatments $Therapeutic Activity: 8-22 mins $Therapeutic Exercise: 23-37 mins  Elston Slot, M.S. OTR/L  12/18/23, 2:55 PM  ascom [phone]

## 2023-12-18 NOTE — TOC Progression Note (Addendum)
 Transition of Care [facility]) - Progression Note    Patient Details  Name: Gary [name] MRN: [id number] Date of Birth: Jul 20, 1955  Transition of Care [name]) CM/SW Contact  [name], LCSW Phone Number: 12/18/2023, 10:48 AM  Clinical Narrative:  Left message for [facility] SNF liaison to see if there are any updates on the financial screening.   11:52 am: [facility] liaison will come talk with patient today.  4:15 pm: SNF liaison said patient signed needed forms. They still need to talk with sister.  Expected Discharge Plan: Skilled Nursing Facility Barriers to Discharge: No SNF bed               Expected Discharge Plan and Services     Post Acute Care Choice: Skilled Nursing Facility Living arrangements for the past 2 months: Skilled Nursing Facility [facility])                                       Social Drivers of Health (SDOH) Interventions SDOH Screenings   Food Insecurity: No Food Insecurity (11/30/2023)  Housing: High Risk (11/30/2023)  Transportation Needs: No Transportation Needs (11/30/2023)  Utilities: Not At Risk (11/30/2023)  Depression (PHQ2-9): Low Risk  (06/07/2021)  Financial Resource Strain: High Risk (11/21/2022)   Received from [facility]  Physical Activity: Insufficiently Active (05/03/2017)  Social Connections: Moderately Isolated (11/30/2023)  Stress: Stress Concern Present (11/20/2022)   Received from [facility]  Tobacco Use: Medium Risk (11/30/2023)    Readmission Risk Interventions    03/19/2023    9:58 AM 11/05/2022    2:59 PM 11/05/2022   12:16 PM  Readmission Risk Prevention Plan  Transportation Screening Complete Complete Complete  Medication Review Oceanographer) Complete Complete Complete  PCP or Specialist appointment within 3-5 days of discharge Complete Complete Complete  HRI or Home Care Consult Complete Complete Complete  SW Recovery Care/Counseling Consult Complete Complete Complete  Palliative Care  Screening Complete Complete Complete  Skilled Nursing Facility Not Applicable Not Applicable Not Applicable

## 2023-12-18 NOTE — Progress Notes (Signed)
 Progress Note   Patient: Gary [name] FMW:[id number] DOB: 1955/07/10 DOA: 11/28/2023     20 DOS: the patient was seen and examined on 12/18/2023   Brief hospital course: Taken from prior notes.   Gary [name] is a 68 y.o. male with medical history significant of heart failure preserved ejection fraction, hx of  DVT s/p treatment , AAA, COPD, hypertension, anemia, chronic hypoxic and hypercapnic respiratory failure on chronic trach ([place]iley XLT #7 ) on [place] 5-6L during the day and bipap at bedtime. Patient  presents to ED  from  rehab facility in [place]. EMS was called due sat in 70's . Per notes patient uses Bipap but facility does not provide this therapy.   Patient was found to be with acute on chronic hypoxic and hypercapnic respiratory failure and was placed on BiPAP.  Labs with worsening anemia with hemoglobin at 7.6.  Received IV Lasix , he also received IV iron .   He had multiple maroon colored Bms with clot mixed in.  He received 1 units of PRBC, seen by GI, not recommended any additional workup.     Principal Problem:   Acute on chronic respiratory failure with hypoxia and hypercapnia ([name]) Active Problems:   Blood loss anemia   Acute metabolic encephalopathy   COPD exacerbation ([name])   Acute on chronic heart failure with preserved ejection fraction (HFpEF) ([name])   CKD stage 3a, GFR 45-59 ml/min ([name])   Iron  deficiency anemia   Chronic colitis   Sleep apnea   Normocytic anemia   Morbid obesity ([name])   Hypotension   Gastrointestinal hemorrhage associated with intestinal diverticulosis   Acute on chronic hypoxic respiratory failure ([name])   Assessment and Plan:  LGIB , Likely diverticular  Acute blood loss anemia. Iron  deficient anemia. Patient with multiple maroon colored stools. He has had this in the past. During hospitalization 04/2023 it was thought to be related to diverticular bleed vs SCAD. Because patient had recent colonoscopy showing mod to severe colitis no  further GI intervention was recommended at that time.  CTA of the abd was performed without evidence of extravasation. Status post 2 units of blood transfusion No evidence of shock, lactate within normal limits.  Tagged red cell scan was negative for active GI bleeding  Coag studies WNL Had received IV iron , hemoglobin still low but stabilizing.   Acute on chronic respiratory failure with hypoxia and hypercapnia Resolving   Obesity hypoventilation syndrome. Class III obesity with BMI 53 metabolic alkalosis compensatory for Hypercapnic RF  Currently off BiPAP, patient has a chronic trach and uses 6 L of oxygen  at baseline.  Patient will go to a different nursing home who can provide BiPAP at discharge.  Currently pending placement.     # Acute metabolic encephalopathy: Resolved Secondary to hypercapnia. Condition improved.   # COPD exacerbation:  Has completed steroid taper Continue with bronchodilator and supportive care   # Acute on chronic HFpEF  Has more short of breath today, has been off diuretics, I will restart home dose torsemide  and Aldactone .  Follow renal function.     CKD stage 3a, GFR 45-59 ml/min ([name]) Hypokalemia resolved Continue to monitor renal function closely    H/o Chronic colitis - Continue home mesalamine  Flexible sigmoidoscopy with GI showed no active colitis of L sided colon   Sleep apnea OHS - Continue BiPAP at night   Prolonged Qtc on tele. Continue to monitor.  Avoid qtc prolonging medications.    Hypotension Resolved Blood pressure  stable remains soft.  -Continue home midodrine    Anxiety, Continue Atarax    Ambulatory dysfunction Pending placement     Subjective:  Patient has some short of breath today, no cough.  Poor appetite  Physical Exam: Vitals:   12/18/23 1000 12/18/23 1200 12/18/23 1240 12/18/23 1300  BP:   118/68   Pulse: 83 79 80 78  Resp: (!) 37 (!) 28  (!) 27  Temp:   97.9 F (36.6 C)   TempSrc:    Axillary   SpO2: 93% 92% 93% 91%  Weight:      Height:       General exam: Appears calm and comfortable, morbid obesity. Respiratory system: Decreased breath sounds at the bases. Respiratory effort normal. Cardiovascular system: S1 & S2 heard, RRR. No JVD, murmurs, rubs, gallops or clicks.  Gastrointestinal system: Abdomen is nondistended, soft and nontender. No organomegaly or masses felt. Normal bowel sounds heard. Central nervous system: Alert and oriented. No focal neurological deficits. Extremities: Chronic bilateral leg lymphedema. Skin: No rashes, lesions or ulcers Psychiatry: Judgement and insight appear normal. Mood & affect appropriate.    Data Reviewed:  Reviewed lab results.  Family Communication: None  Disposition: Status is: Inpatient Remains inpatient appropriate because: Severity of disease.     Time spent: 50 minutes  Author: Murvin Mana, MD 12/18/2023 2:38 PM  For on call review [email or web address].

## 2023-12-19 DIAGNOSIS — K5791 Diverticulosis of intestine, part unspecified, without perforation or abscess with bleeding: Secondary | ICD-10-CM | POA: Diagnosis not present

## 2023-12-19 DIAGNOSIS — I5033 Acute on chronic diastolic (congestive) heart failure: Secondary | ICD-10-CM | POA: Diagnosis not present

## 2023-12-19 DIAGNOSIS — J9622 Acute and chronic respiratory failure with hypercapnia: Secondary | ICD-10-CM | POA: Diagnosis not present

## 2023-12-19 DIAGNOSIS — J9621 Acute and chronic respiratory failure with hypoxia: Secondary | ICD-10-CM | POA: Diagnosis not present

## 2023-12-19 LAB — BASIC METABOLIC PANEL WITH GFR
Anion gap: 8 (ref 5–15)
BUN: 16 mg/dL (ref 8–23)
CO2: 35 mmol/L — ABNORMAL HIGH (ref 22–32)
Calcium: 8 mg/dL — ABNORMAL LOW (ref 8.9–10.3)
Chloride: 101 mmol/L (ref 98–111)
Creatinine, Ser: 1.41 mg/dL — ABNORMAL HIGH (ref 0.61–1.24)
GFR, Estimated: 55 mL/min — ABNORMAL LOW (ref >60)
Glucose, Bld: 79 mg/dL (ref 70–99)
Potassium: 3.7 mmol/L (ref 3.5–5.1)
Sodium: 144 mmol/L (ref 135–145)

## 2023-12-19 LAB — PROCALCITONIN: Procalcitonin: <0.1 ng/mL

## 2023-12-19 LAB — HEMOGLOBIN: Hemoglobin: 8.3 g/dL — ABNORMAL LOW (ref 13.0–17.0)

## 2023-12-19 LAB — MAGNESIUM: Magnesium: 1.8 mg/dL (ref 1.7–2.4)

## 2023-12-19 MED ORDER — MIDODRINE HCL 5 MG PO TABS
2.5000 mg | ORAL_TABLET | Freq: Three times a day (TID) | ORAL | Status: DC
  Administered 2023-12-19 – 2023-12-20 (×2): 2.5 mg via ORAL
  Filled 2023-12-19 (×2): qty 1

## 2023-12-19 MED ORDER — TORSEMIDE 20 MG PO TABS: 40.0000 mg | ORAL_TABLET | Freq: Every day | ORAL | Status: DC

## 2023-12-19 NOTE — TOC Progression Note (Addendum)
 Transition of Care [facility]) - Progression Note    Patient Details  Name: Gary [name] MRN: [id number] Date of Birth: 1955/04/24  Transition of Care [facility]) CM/SW Contact  [name], LCSW Phone Number: 12/19/2023, 1:01 PM  Clinical Narrative:  Left voicemail for [facility] liaison.   1:12 pm: SNF business office manager left a message for sister. Awaiting a call back.  Expected Discharge Plan: Skilled Nursing Facility Barriers to Discharge: No SNF bed               Expected Discharge Plan and Services     Post Acute Care Choice: Skilled Nursing Facility Living arrangements for the past 2 months: Skilled Nursing Facility [facility])                                       Social Drivers of Health (SDOH) Interventions SDOH Screenings   Food Insecurity: No Food Insecurity (11/30/2023)  Housing: High Risk (11/30/2023)  Transportation Needs: No Transportation Needs (11/30/2023)  Utilities: Not At Risk (11/30/2023)  Depression (PHQ2-9): Low Risk  (06/07/2021)  Financial Resource Strain: High Risk (11/21/2022)   Received from [facility]  Physical Activity: Insufficiently Active (05/03/2017)  Social Connections: Moderately Isolated (11/30/2023)  Stress: Stress Concern Present (11/20/2022)   Received from [facility]  Tobacco Use: Medium Risk (11/30/2023)    Readmission Risk Interventions    03/19/2023    9:58 AM 11/05/2022    2:59 PM 11/05/2022   12:16 PM  Readmission Risk Prevention Plan  Transportation Screening Complete Complete Complete  Medication Review Oceanographer) Complete Complete Complete  PCP or Specialist appointment within 3-5 days of discharge Complete Complete Complete  HRI or Home Care Consult Complete Complete Complete  SW Recovery Care/Counseling Consult Complete Complete Complete  Palliative Care Screening Complete Complete Complete  Skilled Nursing Facility Not Applicable Not Applicable Not Applicable

## 2023-12-19 NOTE — Progress Notes (Signed)
 Occupational Therapy Treatment Patient Details Name: Gary [name] MRN: [id number] DOB: Nov 10, 1955 Today's Date: 12/19/2023   History of present illness Pt is a 68 y.o. male with medical history significant of HFpEF, DVT, AAA, COPD, HTN, anemia, chronic hypoxic and hypercapnic respiratory failure on chronic trach on [place] 5-6L during the day and bipap at bedtime. Patient  presents to ED  from rehab facility in [place]. EMS was called due sat in 70's . Per notes patient uses Bipap but facility does not provide this therapy.  Patient was placed on NRB in the field with increase in sat to 89-90 %. Of note patient has interim history of hospitalization form 10/11/2023-10/26/23 with diagnosis of acute on chronic hypoxic and hypercapnic respiratory failure, sepsis secondary to Pseudomonas. MD assessment includes: acute on chronic respiratory failure with hypoxia and hypercapnia, acute metabolic encephalopathy, COPD exacerbation, acute on chronic heart failure with preserved ejection fraction, hypotension, and normocytic anemia.   OT comments  Gary [name] was seen for OT treatment on this date. Upon arrival to room pt in bed, agreeable to tx. Pt requires MOD A exit bed, assist for BLE, reports edema limiting. CGA + RW sit<>stand from max elevated bed height, poor tolerance <1 min static standing, unable to weight shift and clear step. Pt making good progress toward goals, will continue to follow POC. Discharge recommendation remains appropriate.        If plan is discharge home, recommend the following:  A lot of help with walking and/or transfers;A lot of help with bathing/dressing/bathroom;Assistance with cooking/housework;Assistance with feeding;Assist for transportation;Help with stairs or ramp for entrance   Equipment Recommendations  Other (comment)    Recommendations for Other Services      Precautions / Restrictions Precautions Precautions: Fall Recall of Precautions/Restrictions:  Impaired Restrictions Weight Bearing Restrictions Per Provider Order: No       Mobility Bed Mobility Overal bed mobility: Needs Assistance Bed Mobility: Supine to Sit, Sit to Supine     Supine to sit: Mod assist Sit to supine: Mod assist        Transfers Overall transfer level: Needs assistance Equipment used: Rolling walker (2 wheels) Transfers: Sit to/from Stand Sit to Stand: Contact guard assist, From elevated surface                 Balance Overall balance assessment: Needs assistance Sitting-balance support: Feet supported, Single extremity supported Sitting balance-[name] Scale: Good     Standing balance support: Bilateral upper extremity supported, During functional activity, Reliant on assistive device for balance Standing balance-[name] Scale: Poor                             ADL either performed or assessed with clinical judgement   ADL Overall ADL's : Needs assistance/impaired                                       General ADL Comments: MAX A don B socks in sitting     Communication Communication Communication: No apparent difficulties Factors Affecting Communication: Trach/intubated   Cognition Arousal: Alert Behavior During Therapy: WFL for tasks assessed/performed, Anxious Cognition: No apparent impairments                               Following commands: Intact Following commands impaired: Follows one step  commands with increased time      Cueing      Exercises      Shoulder Instructions       General Comments      Pertinent Vitals/ Pain       Pain Assessment Pain Assessment: No/denies pain   Frequency  Min 2X/week        Progress Toward Goals  OT Goals(current goals can now be found in the care plan section)  Progress towards OT goals: Progressing toward goals  Acute Rehab OT Goals OT Goal Formulation: With patient Time For Goal Achievement: 12/31/23 Potential to  Achieve Goals: Fair ADL Goals Pt Will Perform Grooming: with set-up;sitting Pt Will Perform Lower Body Dressing: with mod assist;sitting/lateral leans Pt Will Transfer to Toilet: with mod assist;bedside commode Pt Will Perform Toileting - Clothing Manipulation and hygiene: with min assist;sit to/from stand  Plan      Co-evaluation    PT/OT/SLP Co-Evaluation/Treatment: Yes Reason for Co-Treatment: For patient/therapist safety;To address functional/ADL transfers PT goals addressed during session: Mobility/safety with mobility;Proper use of DME;Strengthening/ROM OT goals addressed during session: ADL's and self-care      AM-PAC OT 6 Clicks Daily Activity     Outcome Measure   Help from another person eating meals?: None Help from another person taking care of personal grooming?: A Little Help from another person toileting, which includes using toliet, bedpan, or urinal?: A Little Help from another person bathing (including washing, rinsing, drying)?: A Lot Help from another person to put on and taking off regular upper body clothing?: A Lot Help from another person to put on and taking off regular lower body clothing?: A Lot 6 Click Score: 16    End of Session Equipment Utilized During Treatment: Oxygen   OT Visit Diagnosis: Unsteadiness on feet (R26.81);Muscle weakness (generalized) (M62.81)   Activity Tolerance Patient tolerated treatment well   Patient Left in bed;with call bell/phone within reach   Nurse Communication          Time: 8593-8554 OT Time Calculation (min): 39 min  Charges: OT General Charges $OT Visit: 1 Visit OT Treatments $Self Care/Home Management : 8-22 mins $Therapeutic Activity: 8-22 mins  Gary [name], M.S. OTR/L  12/19/23, 3:43 PM  ascom [phone]

## 2023-12-19 NOTE — Progress Notes (Signed)
 Progress Note   Patient: Gary [name] FMW:[id number] DOB: [birth year] DOA: 11/28/2023     21 DOS: the patient was seen and examined on 12/19/2023   Brief hospital course: Taken from prior notes.   Gary [name] is a 68 y.o. 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 Patient  presents to ED  from  rehab facility in [place]. EMS was called due sat in 70's . Per notes patient uses Bipap but facility does not provide this therapy.   Patient was found to be with acute on chronic hypoxic and hypercapnic respiratory failure and was placed on BiPAP.  Labs with worsening anemia with hemoglobin at 7.6.  Received IV Lasix , he also received IV iron .   He had multiple maroon colored Bms with clot mixed in.  He received 1 units of PRBC, seen by GI, not recommended any additional workup.     Principal Problem:   Acute on chronic respiratory failure with hypoxia and hypercapnia ([name]) Active Problems:   Blood loss anemia   Acute metabolic encephalopathy   COPD exacerbation ([name])   Acute on chronic heart failure with preserved ejection fraction (HFpEF) ([name])   CKD stage 3a, GFR 45-59 ml/min ([name])   Iron  deficiency anemia   Chronic colitis   Sleep apnea   Normocytic anemia   Morbid obesity ([name])   Hypotension   Gastrointestinal hemorrhage associated with intestinal diverticulosis   Acute on chronic hypoxic respiratory failure ([name])   Assessment and Plan: LGIB , Likely diverticular  Acute blood loss anemia. Iron  deficient anemia. Patient with multiple maroon colored stools. He has had this in the past. During hospitalization 04/2023 it was thought to be related to diverticular bleed vs SCAD. Because patient had recent colonoscopy showing mod to severe colitis no  further GI intervention was recommended at that time.  CTA of the abd was performed without evidence of extravasation. Status post 2 units of blood transfusion No evidence of shock, lactate within normal limits.  Tagged red cell scan was negative for active GI bleeding  Coag studies WNL Had received IV iron . Hemoglobin improving after restarting diuretics.   Acute on chronic respiratory failure with hypoxia and hypercapnia Resolving   Obesity hypoventilation syndrome. Class III obesity with BMI 53 metabolic alkalosis compensatory for Hypercapnic RF  Currently off BiPAP, patient has a chronic trach and uses 6 L of oxygen  at baseline.  Patient has increased airway secretions today eating breakfast.  Obtain speech therapy evaluation.  Also check a procalcitonin level, still less than 0.1, no pneumonia.  Continue to follow.     # Acute metabolic encephalopathy: Resolved Secondary to hypercapnia. Condition improved.   # COPD exacerbation:  Has completed steroid taper Continue with bronchodilator and supportive care   # Acute on chronic HFpEF  Has more short of breath today, has been off diuretics, restarted home dose of diuretics on 9/3.  Renal function slightly worse today.  Reduce torsemide  to daily.     CKD stage 3a, GFR 45-59 ml/min ([name]) Hypokalemia resolved Slightly worsening renal function today, reduced dose of torsemide      H/o Chronic colitis - Continue home mesalamine  Flexible sigmoidoscopy with GI showed no active colitis of L sided colon   Sleep apnea OHS - Continue BiPAP at night  Prolonged Qtc on tele. Continue to monitor.  Avoid qtc prolonging medications.    Hypotension Resolved Blood pressure more stable today, reduce the dose of midodrine  to 2.5 mg 3 times a day.   Anxiety, Continue Atarax    Ambulatory dysfunction Pending placement      Subjective:  Still has cough, had increased airway secretion after eating breakfast.  Physical  Exam: Vitals:   12/19/23 0022 12/19/23 0357 12/19/23 0820 12/19/23 1143  BP: 116/69 125/80 124/76 120/62  Pulse: 75 77 81 78  Resp: (!) 24 20 (!) 24 (!) 25  Temp: (!) 97.4 F (36.3 C) 98.2 F (36.8 C) 97.7 F (36.5 C) 97.6 F (36.4 C)  TempSrc: Axillary Oral Oral Axillary  SpO2: 91% 94% 93% 96%  Weight:      Height:       General exam: Appears calm and comfortable, morbid obesity. Respiratory system: Decreased breath sounds. Respiratory effort normal. Cardiovascular system: S1 & S2 heard, RRR. No JVD, murmurs, rubs, gallops or clicks. Gastrointestinal system: Abdomen is nondistended, soft and nontender. No organomegaly or masses felt. Normal bowel sounds heard. Central nervous system: Alert and oriented. No focal neurological deficits. Extremities: 3+ leg edema Skin: No rashes, lesions or ulcers Psychiatry: Judgement and insight appear normal. Mood & affect appropriate.    Data Reviewed:  Lab results reviewed.  Family Communication: None  Disposition: Status is: Inpatient Remains inpatient appropriate because: Severity of disease.  Pending placement.     Time spent: 35 minutes  Author: Murvin Mana, MD 12/19/2023 2:27 PM  For on call review [email or web address].
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
## 2023-12-19 NOTE — Progress Notes (Signed)
 Physical Therapy Treatment Patient Details Name: Gary [name] MRN: [id number] DOB: May 21, 1955 Today's Date: 12/19/2023   History of Present Illness Pt is a 68 y.o. 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 Patient  presents to ED  from rehab facility in [place]. EMS was called due sat in 70's . Per notes patient uses Bipap but facility does not provide this therapy.  Patient was placed on NRB in the field with increase in sat to 89-90 %. Of note patient has interim history of hospitalization form 10/11/2023-10/26/23 with diagnosis of acute on chronic hypoxic and hypercapnic respiratory failure, sepsis secondary to Pseudomonas. MD assessment includes: acute on chronic respiratory failure with hypoxia and hypercapnia, acute metabolic encephalopathy, COPD exacerbation, acute on chronic heart failure with preserved ejection fraction, hypotension, and normocytic anemia.    PT Comments  Pt received sitting EOB with OT. Agreeable for PT/OT for STS and hopeful transfer to recliner. Pt more alert and interactive compared to Monday session. Pt resting on 4 L/min with SPO2 remaining > 90% at rest and with STS effort. CGA for STS to BRW from extremely elevated bed height for ~15 seconds. Pt reports significant LE heaviness and weakness leading to I inability to take steps for SPT to recliner. Poor eccentric control with descent onto bedside. ModA+2 at LE's to return to supine with bed features pt mod-I to boost up to chair position in bed.  All needs in reach with d/c recs remaining appropriate.    If plan is discharge home, recommend the following: A lot of help with bathing/dressing/bathroom;Assistance with cooking/housework;Direct supervision/assist for medications management;Assist for transportation;Help with stairs or ramp for entrance;Direct supervision/assist  for financial management;Two people to help with walking and/or transfers   Can travel by private vehicle     No  Equipment Recommendations  Other (comment) (TBD)    Recommendations for Other Services       Precautions / Restrictions Precautions Precautions: Fall Recall of Precautions/Restrictions: Impaired Restrictions Weight Bearing Restrictions Per Provider Order: No     Mobility  Bed Mobility   Bed Mobility: Sit to Supine           General bed mobility comments: modA+2 for LE's to return to supine. Bed features to boost himself up mod-I Patient Response: Cooperative  Transfers Overall transfer level: Needs assistance Equipment used: Rolling walker (2 wheels) Transfers: Sit to/from Stand Sit to Stand: Contact guard assist, From elevated surface           General transfer comment: extremely elevated bed height used to RW to stand    Ambulation/Gait               General Gait Details: Pt unable to progress from standing to steps to recliner. Reports heaviness in LE's and weakness   Stairs             Wheelchair Mobility     Tilt Bed Tilt Bed Patient Response: Cooperative  Modified Rankin (Stroke Patients Only)       Balance Overall balance assessment: Needs assistance Sitting-balance support: Feet supported, Single extremity supported Sitting balance-[name] Scale: Good     Standing balance support: Bilateral upper extremity supported, During functional activity, Reliant on assistive device for balance Standing balance-[name] Scale: Poor  Communication Communication Communication: No apparent difficulties Factors Affecting Communication: Trach/intubated  Cognition Arousal: Alert Behavior During Therapy: WFL for tasks assessed/performed   PT - Cognitive impairments: No apparent impairments                         Following commands: Intact Following commands impaired: Follows  one step commands with increased time    Cueing Cueing Techniques: Verbal cues  Exercises      General Comments        Pertinent Vitals/Pain Pain Assessment Pain Assessment: No/denies pain    Home Living                          Prior Function            PT Goals (current goals can now be found in the care plan section) Acute Rehab PT Goals Patient Stated Goal: To get stronger and walk better PT Goal Formulation: With patient Time For Goal Achievement: 12/30/23 Potential to Achieve Goals: Fair Progress towards PT goals: Progressing toward goals    Frequency    Min 2X/week      PT Plan      Co-evaluation PT/OT/SLP Co-Evaluation/Treatment: Yes Reason for Co-Treatment: For patient/therapist safety;To address functional/ADL transfers PT goals addressed during session: Mobility/safety with mobility;Proper use of DME;Strengthening/ROM OT goals addressed during session: ADL's and self-care      AM-PAC PT 6 Clicks Mobility   Outcome Measure  Help needed turning from your back to your side while in a flat bed without using bedrails?: A Little Help needed moving from lying on your back to sitting on the side of a flat bed without using bedrails?: A Little Help needed moving to and from a bed to a chair (including a wheelchair)?: Total Help needed standing up from a chair using your arms (e.g., wheelchair or bedside chair)?: A Lot Help needed to walk in hospital room?: Total Help needed climbing 3-5 steps with a railing? : Total 6 Click Score: 11    End of Session Equipment Utilized During Treatment: Oxygen  Activity Tolerance: Patient tolerated treatment well Patient left: in bed;with call bell/phone within reach;with bed alarm set Nurse Communication: Mobility status PT Visit Diagnosis: Difficulty in walking, not elsewhere classified (R26.2);Muscle weakness (generalized) (M62.81);Pain     Time: 1431-1444 PT Time Calculation (min) (ACUTE ONLY): 13  min  Charges:      PT General Charges $$ ACUTE PT VISIT: 1 Visit                     Dorina HERO. Fairly IV, PT, DPT Physical Therapist- [place]  [facility] 12/19/2023, 3:46 PM

## 2023-12-20 DIAGNOSIS — D500 Iron deficiency anemia secondary to blood loss (chronic): Secondary | ICD-10-CM | POA: Diagnosis not present

## 2023-12-20 DIAGNOSIS — J9621 Acute and chronic respiratory failure with hypoxia: Secondary | ICD-10-CM | POA: Diagnosis not present

## 2023-12-20 DIAGNOSIS — J9622 Acute and chronic respiratory failure with hypercapnia: Secondary | ICD-10-CM | POA: Diagnosis not present

## 2023-12-20 LAB — BASIC METABOLIC PANEL WITH GFR
Anion gap: 10 (ref 5–15)
BUN: 20 mg/dL (ref 8–23)
CO2: 36 mmol/L — ABNORMAL HIGH (ref 22–32)
Calcium: 7.7 mg/dL — ABNORMAL LOW (ref 8.9–10.3)
Chloride: 99 mmol/L (ref 98–111)
Creatinine, Ser: 1.54 mg/dL — ABNORMAL HIGH (ref 0.61–1.24)
GFR, Estimated: 49 mL/min — ABNORMAL LOW (ref >60)
Glucose, Bld: 88 mg/dL (ref 70–99)
Potassium: 3.7 mmol/L (ref 3.5–5.1)
Sodium: 145 mmol/L (ref 135–145)

## 2023-12-20 MED ORDER — TORSEMIDE 20 MG PO TABS: 40.0000 mg | ORAL_TABLET | ORAL | Status: DC

## 2023-12-20 MED ORDER — MIDODRINE HCL 5 MG PO TABS
2.5000 mg | ORAL_TABLET | Freq: Two times a day (BID) | ORAL | Status: DC
  Administered 2023-12-20 – 2023-12-21 (×3): 2.5 mg via ORAL
  Filled 2023-12-20 (×3): qty 1

## 2023-12-20 MED ORDER — ALBUMIN HUMAN 25 % IV SOLN
25.0000 g | Freq: Once | INTRAVENOUS | Status: AC
  Administered 2023-12-20: 25 g via INTRAVENOUS
  Filled 2023-12-20: qty 100

## 2023-12-20 MED ORDER — FUROSEMIDE 10 MG/ML IJ SOLN
60.0000 mg | Freq: Once | INTRAMUSCULAR | Status: AC
  Administered 2023-12-20: 60 mg via INTRAVENOUS
  Filled 2023-12-20: qty 6

## 2023-12-20 MED ORDER — SPIRONOLACTONE 25 MG PO TABS: 25.0000 mg | ORAL_TABLET | ORAL | Status: DC

## 2023-12-20 NOTE — Plan of Care (Signed)
  Problem: Education: Goal: Knowledge of General Education information will improve Description: Including pain rating scale, medication(s)/side effects and non-pharmacologic comfort measures Outcome: Progressing   Problem: Health Behavior/Discharge Planning: Goal: Ability to manage health-related needs will improve Outcome: Progressing   Problem: Clinical Measurements: Goal: Ability to maintain clinical measurements within normal limits will improve Outcome: Progressing Goal: Will remain free from infection Outcome: Progressing Goal: Diagnostic test results will improve Outcome: Progressing Goal: Respiratory complications will improve Outcome: Progressing Goal: Cardiovascular complication will be avoided Outcome: Progressing   Problem: Activity: Goal: Risk for activity intolerance will decrease Outcome: Progressing   Problem: Nutrition: Goal: Adequate nutrition will be maintained Outcome: Progressing   Problem: Coping: Goal: Level of anxiety will decrease Outcome: Progressing   Problem: Elimination: Goal: Will not experience complications related to bowel motility Outcome: Progressing Goal: Will not experience complications related to urinary retention Outcome: Progressing   Problem: Pain Managment: Goal: General experience of comfort will improve and/or be controlled Outcome: Progressing   Problem: Safety: Goal: Ability to remain free from injury will improve Outcome: Progressing   Problem: Skin Integrity: Goal: Risk for impaired skin integrity will decrease Outcome: Progressing   Problem: Education: Goal: Knowledge of disease or condition will improve Outcome: Progressing Goal: Knowledge of the prescribed therapeutic regimen will improve Outcome: Progressing Goal: Individualized Educational Video(s) Outcome: Progressing   Problem: Activity: Goal: Ability to tolerate increased activity will improve Outcome: Progressing Goal: Will verbalize the  importance of balancing activity with adequate rest periods Outcome: Progressing   Problem: Respiratory: Goal: Ability to maintain a clear airway will improve Outcome: Progressing Goal: Levels of oxygenation will improve Outcome: Progressing Goal: Ability to maintain adequate ventilation will improve Outcome: Progressing

## 2023-12-20 NOTE — TOC Progression Note (Addendum)
 Transition of Care [facility]) - Progression Note    Patient Details  Name: Gary [name] MRN: [id number] Date of Birth: 11/28/1955  Transition of Care [facility] CM/SW Contact  [name], LCSW Phone Number: 12/20/2023, 2:01 PM  Clinical Narrative:   CSW spoke to sister [name]. She said she has left messages for the business office manager at [facility] but will try again.  2:40 pm: SNF spoke to sister. She has to bring life insurance policy, accidental policy, and bank statement to the facility and will get those to them next week.  Expected Discharge Plan: Skilled Nursing Facility Barriers to Discharge: No SNF bed               Expected Discharge Plan and Services     Post Acute Care Choice: Skilled Nursing Facility Living arrangements for the past 2 months: Skilled Nursing Facility [facility])                                       Social Drivers of Health (SDOH) Interventions SDOH Screenings   Food Insecurity: No Food Insecurity (11/30/2023)  Housing: High Risk (11/30/2023)  Transportation Needs: No Transportation Needs (11/30/2023)  Utilities: Not At Risk (11/30/2023)  Depression (PHQ2-9): Low Risk  (06/07/2021)  Financial Resource Strain: High Risk (11/21/2022)   Received from [facility]  Physical Activity: Insufficiently Active (05/03/2017)  Social Connections: Moderately Isolated (11/30/2023)  Stress: Stress Concern Present (11/20/2022)   Received from [facility]  Tobacco Use: Medium Risk (11/30/2023)    Readmission Risk Interventions    03/19/2023    9:58 AM 11/05/2022    2:59 PM 11/05/2022   12:16 PM  Readmission Risk Prevention Plan  Transportation Screening Complete Complete Complete  Medication Review Oceanographer) Complete Complete Complete  PCP or Specialist appointment within 3-5 days of discharge Complete Complete Complete  HRI or Home Care Consult Complete Complete Complete  SW Recovery Care/Counseling Consult Complete Complete Complete   Palliative Care Screening Complete Complete Complete  Skilled Nursing Facility Not Applicable Not Applicable Not Applicable

## 2023-12-20 NOTE — Progress Notes (Signed)
 Progress Note   Patient: Gary [name] FMW:[id number] DOB: Jan 21, 1956 DOA: 11/28/2023     22 DOS: the patient was seen and examined on 12/20/2023   Brief hospital course: Taken from prior notes.   SAMIE REASONS is a 68 y.o. 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 Patient  presents to ED  from  rehab facility in [place]. EMS was called due sat in 70's . Per notes patient uses Bipap but facility does not provide this therapy.   Patient was found to be with acute on chronic hypoxic and hypercapnic respiratory failure and was placed on BiPAP.  Labs with worsening anemia with hemoglobin at 7.6.  Received IV Lasix , he also received IV iron .   He had multiple maroon colored Bms with clot mixed in.  He received 1 units of PRBC, seen by GI, not recommended any additional workup.     Principal Problem:   Acute on chronic respiratory failure with hypoxia and hypercapnia ([name]) Active Problems:   Blood loss anemia   Acute metabolic encephalopathy   COPD exacerbation ([name])   Acute on chronic heart failure with preserved ejection fraction (HFpEF) ([name])   CKD stage 3a, GFR 45-59 ml/min ([name])   Iron  deficiency anemia   Chronic colitis   Sleep apnea   Normocytic anemia   Morbid obesity ([name])   Hypotension   Gastrointestinal hemorrhage associated with intestinal diverticulosis   Acute on chronic hypoxic respiratory failure ([name])   Assessment and Plan: LGIB , Likely diverticular  Acute blood loss anemia. Iron  deficient anemia. Patient with multiple maroon colored stools. He has had this in the past. During hospitalization 04/2023 it was thought to be related to diverticular bleed vs SCAD. Because patient had recent colonoscopy showing mod to severe colitis no  further GI intervention was recommended at that time.  CTA of the abd was performed without evidence of extravasation. Status post 2 units of blood transfusion No evidence of shock, lactate within normal limits.  Tagged red cell scan was negative for active GI bleeding  Coag studies WNL Had received IV iron . Does not seem to have additional GI bleed, hemoglobin has been more stabilized.  Recheck a Hb tomorrow.   Acute on chronic respiratory failure with hypoxia and hypercapnia Resolving   Obesity hypoventilation syndrome. Class III obesity with BMI 53 metabolic alkalosis compensatory for Hypercapnic RF  Currently off BiPAP, patient has a chronic trach and uses 6 L of oxygen  at baseline.  Patient has increased airway secretions today eating breakfast.  Obtain speech therapy evaluation.  Also check a procalcitonin level, still less than 0.1, no pneumonia.   Patient appeared to have increased airway secretion, significant edema.  Discussed with speech therapy, patient always lying flat in bed, increase the risk of aspiration from acid reflux.  He also seemed to have volume overload, still has significant leg edema, oral torsemide  and Aldactone  has made her kidney function worse.  I will try to give once a day IV Lasix  in junction with 25 g of albumin .  Follow-up renal function closely.     # Acute metabolic encephalopathy: Resolved Secondary to hypercapnia. Condition improved.   # COPD exacerbation:  Has completed steroid taper Continue with bronchodilator and supportive care   # Acute on chronic HFpEF  As above.     CKD stage 3a, GFR 45-59 ml/min ([name]) Hypokalemia resolved Renal function is getting worse gradually after oral diuretics.  Will try IV Lasix  daily with albumin  as patient still has significant volume overload.     H/o Chronic colitis - Continue home mesalamine  Flexible sigmoidoscopy with GI showed no active colitis of L sided colon   Sleep apnea OHS - Continue BiPAP  at night   Prolonged Qtc on tele. Continue to monitor.  Avoid qtc prolonging medications.    Hypotension Resolved Midodrine  to 2.5 mg twice a day.  Planning to wean off by tomorrow if blood pressure still stable.   Anxiety, Continue Atarax    Ambulatory dysfunction Pending placement        Subjective:  Patient still complaining short of breath, wheezing.  He seemed to have increased airway secretion.  Physical Exam: Vitals:   12/20/23 0047 12/20/23 0135 12/20/23 0451 12/20/23 0751  BP: 103/64  109/70 121/68  Pulse: 78 77 74 74  Resp: (!) 24 20 (!) 22 17  Temp: 98 F (36.7 C)  98.2 F (36.8 C) 97.8 F (36.6 C)  TempSrc: Axillary  Axillary Oral  SpO2: 98% 95% 95% 94%  Weight:      Height:       General exam: Appears calm and comfortable  Respiratory system: Decreased breath sounds with crackles. Respiratory effort normal. Cardiovascular system: S1 & S2 heard, RRR. No JVD, murmurs, rubs, gallops or clicks.  Gastrointestinal system: Abdomen is nondistended, soft and nontender. No organomegaly or masses felt. Normal bowel sounds heard. Central nervous system: Alert and oriented. No focal neurological deficits. Extremities: 3+ leg edema Skin: No rashes, lesions or ulcers Psychiatry: Judgement and insight appear normal. Mood & affect appropriate.    Data Reviewed:  Lab results reviewed.  Family Communication: None  Disposition: Status is: Inpatient Remains inpatient appropriate because: Severity of disease, IV treatment     Time spent: 55 minutes  Author: Murvin Mana, MD 12/20/2023 11:38 AM  For on call review [email or web address].

## 2023-12-20 NOTE — Progress Notes (Signed)
 Speech Language Pathology Treatment: Dysphagia  Patient Details Name: ESTIBEN MIZUNO MRN: [id number] DOB: 12/11/1955 Today's Date: 12/20/2023 Time: 1213-1225 SLP Time Calculation (min) (ACUTE ONLY): 12 min  Assessment / Plan / Recommendation Clinical Impression  During previous swallow evaluation, pt requested that this writer follow up with him today. This writer went pt and provided skilled observation of pt consuming lunch tray with thin liquids via straw. Pt continues to demonstrate adequate oropharyngeal abilities with no overt s/s of aspiration. Education provided to pt on ability level as well as aspiration and potential reflux precautions. Pt voiced understanding. At this time, no further skilled ST services are indicated.    HPI HPI: Pt is a 68 year old male with chronic trach      SLP Plan  All goals met          Recommendations  Diet recommendations: Thin liquid (soft) Liquids provided via: Straw Medication Administration: Whole meds with liquid Supervision: Patient able to self feed;Intermittent supervision to cue for compensatory strategies Compensations: Minimize environmental distractions;Slow rate;Small sips/bites Postural Changes and/or Swallow Maneuvers: Out of bed for meals;Seated upright 90 degrees;Upright 30-60 min after meal                              All goals met    Aleiyah Halpin B. Rubbie, M.S., CCC-SLP, Tree surgeon Certified Brain Injury Specialist [facility]  [facility] Office [phone] Ascom [phone] Fax [phone]

## 2023-12-21 DIAGNOSIS — N1831 Chronic kidney disease, stage 3a: Secondary | ICD-10-CM | POA: Diagnosis not present

## 2023-12-21 DIAGNOSIS — G9341 Metabolic encephalopathy: Secondary | ICD-10-CM | POA: Diagnosis not present

## 2023-12-21 DIAGNOSIS — J9621 Acute and chronic respiratory failure with hypoxia: Secondary | ICD-10-CM | POA: Diagnosis not present

## 2023-12-21 DIAGNOSIS — I5033 Acute on chronic diastolic (congestive) heart failure: Secondary | ICD-10-CM | POA: Diagnosis not present

## 2023-12-21 LAB — BASIC METABOLIC PANEL WITH GFR
Anion gap: 7 (ref 5–15)
BUN: 18 mg/dL (ref 8–23)
CO2: 38 mmol/L — ABNORMAL HIGH (ref 22–32)
Calcium: 7.6 mg/dL — ABNORMAL LOW (ref 8.9–10.3)
Chloride: 97 mmol/L — ABNORMAL LOW (ref 98–111)
Creatinine, Ser: 1.36 mg/dL — ABNORMAL HIGH (ref 0.61–1.24)
GFR, Estimated: 57 mL/min — ABNORMAL LOW (ref >60)
Glucose, Bld: 84 mg/dL (ref 70–99)
Potassium: 3.5 mmol/L (ref 3.5–5.1)
Sodium: 142 mmol/L (ref 135–145)

## 2023-12-21 LAB — MAGNESIUM: Magnesium: 1.9 mg/dL (ref 1.7–2.4)

## 2023-12-21 LAB — HEMOGLOBIN: Hemoglobin: 8.6 g/dL — ABNORMAL LOW (ref 13.0–17.0)

## 2023-12-21 MED ORDER — POTASSIUM CHLORIDE CRYS ER 20 MEQ PO TBCR
40.0000 meq | EXTENDED_RELEASE_TABLET | Freq: Once | ORAL | Status: AC
  Administered 2023-12-21: 40 meq via ORAL
  Filled 2023-12-21: qty 2

## 2023-12-21 MED ORDER — ALBUMIN HUMAN 25 % IV SOLN
12.5000 g | Freq: Once | INTRAVENOUS | Status: AC
  Administered 2023-12-21: 12.5 g via INTRAVENOUS
  Filled 2023-12-21: qty 50

## 2023-12-21 MED ORDER — FUROSEMIDE 10 MG/ML IJ SOLN
80.0000 mg | Freq: Once | INTRAMUSCULAR | Status: AC
  Administered 2023-12-21: 80 mg via INTRAVENOUS
  Filled 2023-12-21: qty 8

## 2023-12-21 NOTE — Plan of Care (Signed)
  Problem: Clinical Measurements: Goal: Ability to maintain clinical measurements within normal limits will improve Outcome: Progressing Goal: Will remain free from infection Outcome: Progressing Goal: Respiratory complications will improve Outcome: Progressing Goal: Cardiovascular complication will be avoided Outcome: Progressing   Problem: Nutrition: Goal: Adequate nutrition will be maintained Outcome: Progressing   Problem: Elimination: Goal: Will not experience complications related to bowel motility Outcome: Progressing Goal: Will not experience complications related to urinary retention Outcome: Progressing   Problem: Pain Managment: Goal: General experience of comfort will improve and/or be controlled Outcome: Progressing   Problem: Respiratory: Goal: Ability to maintain a clear airway will improve Outcome: Progressing Goal: Ability to maintain adequate ventilation will improve Outcome: Progressing

## 2023-12-21 NOTE — Progress Notes (Signed)
 Asked patient to go on Bipap but he stated he did not want to put on, stated he would go on later on.

## 2023-12-21 NOTE — Progress Notes (Signed)
 Progress Note   Patient: Gary [name] FMW:[id number] DOB: Sep 12, 1955 DOA: 11/28/2023     23 DOS: the patient was seen and examined on 12/21/2023   Brief hospital course: Taken from prior notes.   Gary [name] is a 68 y.o. male with medical history significant of heart failure preserved ejection fraction, hx of  DVT s/p treatment , AAA, COPD, hypertension, anemia, chronic hypoxic and hypercapnic respiratory failure on chronic trach (Shiley XLT #7 ) on [place] 5-6L during the day and bipap at bedtime. Patient  presents to ED  from  rehab facility in [place]. EMS was called due sat in 70's . Per notes patient uses Bipap but facility does not provide this therapy.   Patient was found to be with acute on chronic hypoxic and hypercapnic respiratory failure and was placed on BiPAP.  Labs with worsening anemia with hemoglobin at 7.6.  Received IV Lasix , he also received IV iron .   He had multiple maroon colored Bms with clot mixed in.  He received 1 units of PRBC, seen by GI, not recommended any additional workup.     Principal Problem:   Acute on chronic respiratory failure with hypoxia and hypercapnia ([name]) Active Problems:   Blood loss anemia   Acute metabolic encephalopathy   COPD exacerbation ([name])   Acute on chronic heart failure with preserved ejection fraction (HFpEF) ([name])   CKD stage 3a, GFR 45-59 ml/min ([name])   Iron  deficiency anemia   Chronic colitis   Sleep apnea   Normocytic anemia   Morbid obesity ([name])   Hypotension   Gastrointestinal hemorrhage associated with intestinal diverticulosis   Acute on chronic hypoxic respiratory failure ([name])   Assessment and Plan: LGIB , Likely diverticular  Acute blood loss anemia. Iron  deficient anemia. Patient with multiple maroon colored stools. He has had this in the past. During hospitalization 04/2023 it was thought to be related to diverticular bleed vs SCAD. Because patient had recent colonoscopy showing mod to severe colitis no  further GI intervention was recommended at that time.  CTA of the abd was performed without evidence of extravasation. Status post 2 units of blood transfusion No evidence of shock, lactate within normal limits.  Tagged red cell scan was negative for active GI bleeding  Coag studies WNL Had received IV iron . Hemoglobin gradually improving after starting diuretics again.   Acute on chronic respiratory failure with hypoxia and hypercapnia Resolving   Obesity hypoventilation syndrome. Class III obesity with BMI 53 metabolic alkalosis compensatory for Hypercapnic RF  Currently off BiPAP, patient has a chronic trach and uses 6 L of oxygen  at baseline.  Patient has increased airway secretions today eating breakfast.  Obtain speech therapy evaluation.  Also check a procalcitonin level, still less than 0.1, no pneumonia.   Patient appeared to have increased airway secretion, significant edema.  Discussed with speech therapy, patient always lying flat in bed, increase the risk of aspiration from acid reflux.  He also seemed to have volume overload, still has significant leg edema, oral torsemide  and Aldactone  has made her kidney function worse.  I have started IV Lasix  together with albumin  on 9/5, he was diuresing, renal function is also getting better.  Will continue IV Lasix , reassess daily for needs.     # Acute metabolic encephalopathy: Resolved Secondary to hypercapnia. Condition improved.   # COPD exacerbation:  Has completed steroid taper Continue with bronchodilator and supportive care   # Acute on chronic HFpEF  Still volume overloaded, restarted IV  Lasix  together with albumin .    CKD stage 3a, GFR 45-59 ml/min ([name]) Hypokalemia resolved Renal functions better after Lasix .  Give additional dose of potassium for potassium 3.5.  Continue to monitor.     H/o Chronic colitis - Continue home mesalamine  Flexible sigmoidoscopy with GI showed no active colitis of L sided colon   Sleep  apnea OHS Post tracheostomy. - Continue BiPAP at night   Prolonged Qtc on tele. Continue to monitor.  Avoid qtc prolonging medications.    Hypotension Resolved Midodrine  to 2.5 mg twice a day.  Planning to wean off by tomorrow if blood pressure still stable.   Anxiety, Continue Atarax    Ambulatory dysfunction Pending placement        Subjective:  Patient feels better today, still has some airway secretion.  No cough  Physical Exam: Vitals:   12/21/23 0411 12/21/23 0709 12/21/23 0909 12/21/23 1000  BP: 117/63  108/62   Pulse: 78  78 78  Resp: 19  16 19   Temp: 97.9 F (36.6 C)  98.1 F (36.7 C)   TempSrc:   Oral   SpO2: 97% 95% 90% 94%  Weight:      Height:       General exam: Appears calm and comfortable, morbid obesity Respiratory system: Decreased breath sounds. Respiratory effort normal. Cardiovascular system: S1 & S2 heard, RRR. No JVD, murmurs, rubs, gallops or clicks.  Gastrointestinal system: Abdomen is nondistended, soft and nontender. No organomegaly or masses felt. Normal bowel sounds heard. Central nervous system: Alert and oriented. No focal neurological deficits. Extremities: 3+ leg edema Skin: No rashes, lesions or ulcers Psychiatry: Judgement and insight appear normal. Mood & affect appropriate.    Data Reviewed:  Lab results reviewed.  Family Communication: None  Disposition: Status is: Inpatient Remains inpatient appropriate because: Severity of disease, IV treatment.     Time spent: 50 minutes  Author: Murvin Mana, MD 12/21/2023 11:58 AM  For on call review [email or web address].

## 2023-12-22 DIAGNOSIS — J9602 Acute respiratory failure with hypercapnia: Secondary | ICD-10-CM | POA: Diagnosis not present

## 2023-12-22 DIAGNOSIS — G9341 Metabolic encephalopathy: Secondary | ICD-10-CM | POA: Diagnosis not present

## 2023-12-22 DIAGNOSIS — J9621 Acute and chronic respiratory failure with hypoxia: Secondary | ICD-10-CM | POA: Diagnosis not present

## 2023-12-22 DIAGNOSIS — I5033 Acute on chronic diastolic (congestive) heart failure: Secondary | ICD-10-CM | POA: Diagnosis not present

## 2023-12-22 DIAGNOSIS — J9601 Acute respiratory failure with hypoxia: Secondary | ICD-10-CM

## 2023-12-22 DIAGNOSIS — N179 Acute kidney failure, unspecified: Secondary | ICD-10-CM | POA: Diagnosis not present

## 2023-12-22 LAB — GLUCOSE, CAPILLARY
Glucose-Capillary: 102 mg/dL — ABNORMAL HIGH (ref 70–99)
Glucose-Capillary: 53 mg/dL — ABNORMAL LOW (ref 70–99)
Glucose-Capillary: 61 mg/dL — ABNORMAL LOW (ref 70–99)
Glucose-Capillary: 65 mg/dL — ABNORMAL LOW (ref 70–99)
Glucose-Capillary: 72 mg/dL (ref 70–99)
Glucose-Capillary: 84 mg/dL (ref 70–99)
Glucose-Capillary: 93 mg/dL (ref 70–99)

## 2023-12-22 LAB — MRSA NEXT GEN BY PCR, NASAL: MRSA by PCR Next Gen: NOT DETECTED

## 2023-12-22 LAB — BLOOD GAS, ARTERIAL
Acid-Base Excess: 11.5 mmol/L — ABNORMAL HIGH (ref 0.0–2.0)
Acid-Base Excess: 14 mmol/L — ABNORMAL HIGH (ref 0.0–2.0)
Acid-Base Excess: 15.7 mmol/L — ABNORMAL HIGH (ref 0.0–2.0)
Bicarbonate: 43.6 mmol/L — ABNORMAL HIGH (ref 20.0–28.0)
Bicarbonate: 46.7 mmol/L — ABNORMAL HIGH (ref 20.0–28.0)
Bicarbonate: 44.2 mmol/L — ABNORMAL HIGH (ref 20.0–28.0)
Delivery systems: POSITIVE
Delivery systems: POSITIVE
Expiratory PAP: 16 cmH2O
FIO2: 45 %
FIO2: 60 %
FIO2: 40 %
Inspiratory PAP: 29 cmH2O
MECHVT: 450 mL
MECHVT: 600 mL
MECHVT: 500 mL
Mechanical Rate: 22
Mechanical Rate: 16
O2 Saturation: 90.3 %
O2 Saturation: 89.1 %
O2 Saturation: 87.9 %
PEEP: 8 cmH2O
PEEP: 8 cmH2O
Patient temperature: 37
Patient temperature: 37
Patient temperature: 37
pCO2 arterial: 104 mmHg (ref 32–48)
pCO2 arterial: >123 mmHg (ref 32–48)
pCO2 arterial: 82 mmHg (ref 32–48)
pH, Arterial: 7.23 — ABNORMAL LOW (ref 7.35–7.45)
pH, Arterial: 7.18 — CL (ref 7.35–7.45)
pH, Arterial: 7.34 — ABNORMAL LOW (ref 7.35–7.45)
pO2, Arterial: 58 mmHg — ABNORMAL LOW (ref 83–108)
pO2, Arterial: 55 mmHg — ABNORMAL LOW (ref 83–108)
pO2, Arterial: 49 mmHg — ABNORMAL LOW (ref 83–108)

## 2023-12-22 LAB — MAGNESIUM: Magnesium: 1.9 mg/dL (ref 1.7–2.4)

## 2023-12-22 LAB — BASIC METABOLIC PANEL WITH GFR
Anion gap: 7 (ref 5–15)
BUN: 17 mg/dL (ref 8–23)
CO2: 40 mmol/L — ABNORMAL HIGH (ref 22–32)
Calcium: 7.7 mg/dL — ABNORMAL LOW (ref 8.9–10.3)
Chloride: 97 mmol/L — ABNORMAL LOW (ref 98–111)
Creatinine, Ser: 1.55 mg/dL — ABNORMAL HIGH (ref 0.61–1.24)
GFR, Estimated: 49 mL/min — ABNORMAL LOW (ref >60)
Glucose, Bld: 97 mg/dL (ref 70–99)
Potassium: 3.9 mmol/L (ref 3.5–5.1)
Sodium: 144 mmol/L (ref 135–145)

## 2023-12-22 MED ORDER — CHLORHEXIDINE GLUCONATE CLOTH 2 % EX PADS
6.0000 | MEDICATED_PAD | Freq: Every day | CUTANEOUS | Status: DC
  Administered 2023-12-22 – 2023-12-25 (×4): 6 via TOPICAL

## 2023-12-22 MED ORDER — PROPOFOL 1000 MG/100ML IV EMUL
0.0000 ug/kg/min | INTRAVENOUS | Status: DC
  Administered 2023-12-22: 50 ug/kg/min via INTRAVENOUS
  Administered 2023-12-22: 20 ug/kg/min via INTRAVENOUS
  Administered 2023-12-22 – 2023-12-24 (×20): 50 ug/kg/min via INTRAVENOUS
  Filled 2023-12-22 (×22): qty 100

## 2023-12-22 MED ORDER — POLYETHYLENE GLYCOL 3350 17 G PO PACK: 17.0000 g | PACK | Freq: Every day | ORAL | Status: DC

## 2023-12-22 MED ORDER — PROPOFOL 1000 MG/100ML IV EMUL
INTRAVENOUS | Status: AC
  Filled 2023-12-22: qty 100

## 2023-12-22 MED ORDER — DEXTROSE 50 % IV SOLN
INTRAVENOUS | Status: AC
  Administered 2023-12-22: 12.5 g via INTRAVENOUS
  Filled 2023-12-22: qty 50

## 2023-12-22 MED ORDER — DOCUSATE SODIUM 50 MG/5ML PO LIQD: 100.0000 mg | Freq: Two times a day (BID) | ORAL | Status: DC

## 2023-12-22 MED ORDER — FENTANYL CITRATE (PF) 100 MCG/2ML IJ SOLN: 100.0000 ug | Freq: Once | INTRAMUSCULAR | Status: AC

## 2023-12-22 MED ORDER — MIDAZOLAM HCL 2 MG/2ML IJ SOLN: 2.0000 mg | Freq: Once | INTRAMUSCULAR | Status: AC

## 2023-12-22 MED ORDER — DEXTROSE 50 % IV SOLN: 12.5000 g | INTRAVENOUS | Status: AC

## 2023-12-22 MED ORDER — FENTANYL CITRATE (PF) 100 MCG/2ML IJ SOLN
INTRAMUSCULAR | Status: AC
Start: 2023-12-22 — End: 2023-12-22
  Administered 2023-12-22: 100 ug via INTRAVENOUS
  Filled 2023-12-22: qty 2

## 2023-12-22 MED ORDER — FENTANYL CITRATE PF 50 MCG/ML IJ SOSY
50.0000 ug | PREFILLED_SYRINGE | INTRAMUSCULAR | Status: AC | PRN
  Administered 2023-12-22 – 2023-12-23 (×3): 50 ug via INTRAVENOUS
  Filled 2023-12-22 (×2): qty 1

## 2023-12-22 MED ORDER — IPRATROPIUM-ALBUTEROL 0.5-2.5 (3) MG/3ML IN SOLN
3.0000 mL | Freq: Four times a day (QID) | RESPIRATORY_TRACT | Status: DC
  Administered 2023-12-22 – 2023-12-25 (×12): 3 mL via RESPIRATORY_TRACT
  Filled 2023-12-22 (×12): qty 3

## 2023-12-22 MED ORDER — MIDAZOLAM HCL 2 MG/2ML IJ SOLN
2.0000 mg | INTRAMUSCULAR | Status: DC | PRN
  Administered 2023-12-22 – 2023-12-23 (×3): 2 mg via INTRAVENOUS
  Filled 2023-12-22 (×3): qty 2

## 2023-12-22 MED ORDER — IPRATROPIUM-ALBUTEROL 0.5-2.5 (3) MG/3ML IN SOLN: 3.0000 mL | Freq: Four times a day (QID) | RESPIRATORY_TRACT | Status: DC | PRN

## 2023-12-22 MED ORDER — MIDAZOLAM HCL 2 MG/2ML IJ SOLN
INTRAMUSCULAR | Status: AC
  Administered 2023-12-22: 2 mg via INTRAVENOUS
  Filled 2023-12-22: qty 2

## 2023-12-22 MED ORDER — PANTOPRAZOLE SODIUM 40 MG IV SOLR
40.0000 mg | Freq: Every day | INTRAVENOUS | Status: DC
  Administered 2023-12-22 – 2023-12-25 (×4): 40 mg via INTRAVENOUS
  Filled 2023-12-22 (×4): qty 10

## 2023-12-22 MED ORDER — ORAL CARE MOUTH RINSE
15.0000 mL | OROMUCOSAL | Status: DC
  Administered 2023-12-22 – 2023-12-25 (×33): 15 mL via OROMUCOSAL

## 2023-12-22 MED ORDER — FENTANYL CITRATE PF 50 MCG/ML IJ SOSY
50.0000 ug | PREFILLED_SYRINGE | INTRAMUSCULAR | Status: DC | PRN
  Administered 2023-12-22: 100 ug via INTRAVENOUS
  Filled 2023-12-22: qty 2
  Filled 2023-12-22: qty 1

## 2023-12-22 MED ORDER — ORAL CARE MOUTH RINSE: 15.0000 mL | OROMUCOSAL | Status: DC | PRN

## 2023-12-22 NOTE — Progress Notes (Addendum)
 Progress Note   Patient: Gary [name] FMW:[id number] DOB: December 09, 1955 DOA: 11/28/2023     24 DOS: the patient was seen and examined on 12/22/2023   Brief hospital course: Taken from prior notes.   Gary [name] is a 68 y.o. male with medical history significant of heart failure preserved ejection fraction, hx of  DVT s/p treatment , AAA, COPD, hypertension, anemia, chronic hypoxic and hypercapnic respiratory failure on chronic trach (Shiley XLT #7 ) on [place] 5-6L during the day and bipap at bedtime. Patient  presents to ED  from  rehab facility in [place]. EMS was called due sat in 70's . Per notes patient uses Bipap but facility does not provide this therapy.   Patient was found to be with acute on chronic hypoxic and hypercapnic respiratory failure and was placed on BiPAP.  Labs with worsening anemia with hemoglobin at 7.6.  Received IV Lasix , he also received IV iron .   He had multiple maroon colored Bms with clot mixed in.  He received 1 units of PRBC, seen by GI, not recommended any additional workup.  Patient has restarted IV Lasix  since 9/5 due to volume overload.  He developed worsening respite failure with an responsiveness.  ABG showed severe CO2 retention, patient be transferred to ICU 9/7.     Principal Problem:   Acute on chronic respiratory failure with hypoxia and hypercapnia ([name]) Active Problems:   Blood loss anemia   Acute metabolic encephalopathy   COPD exacerbation ([name])   Acute on chronic heart failure with preserved ejection fraction (HFpEF) ([name])   CKD stage 3a, GFR 45-59 ml/min ([name])   Iron  deficiency anemia   Chronic colitis   Sleep apnea   Normocytic anemia   Morbid obesity ([name])   Hypotension   Gastrointestinal hemorrhage associated with intestinal diverticulosis   Acute on chronic hypoxic respiratory failure ([name])   Assessment and Plan:  Acute on chronic respiratory failure with hypoxia and hypercapnia  Obesity hypoventilation syndrome. Class III  obesity with BMI 53 metabolic alkalosis compensatory for Hypercapnic RF  Currently off BiPAP, patient has a chronic trach and uses 6 L of oxygen  at baseline.  Patient has increased airway secretions today eating breakfast.  Obtain speech therapy evaluation.  Also check a procalcitonin level, still less than 0.1, no pneumonia.   Patient appeared to have increased airway secretion, significant edema.  Discussed with speech therapy, patient always lying flat in bed, increase the risk of aspiration from acid reflux.  He also seemed to have volume overload, still has significant leg edema, oral torsemide  and Aldactone  has made her kidney function worse.  I have started IV Lasix  together with albumin  on 9/5, he was diuresing. Patient became unresponsive this morning, ABG showed PH of 7.23, CO2 of 104.  Patient has been on BiPAP overnight, but did not improve.  Discussed with Dr. Isaiah, patient will be transferred to ICU for mechanic ventilation.   LGIB , Likely diverticular  Acute blood loss anemia. Iron  deficient anemia. Patient with multiple maroon colored stools. He has had this in the past. During hospitalization 04/2023 it was thought to be related to diverticular bleed vs SCAD. Because patient had recent colonoscopy showing mod to severe colitis no further GI intervention was recommended at that time.  CTA of the abd was performed without evidence of extravasation. Status post 2 units of blood transfusion No evidence of shock, lactate within normal limits.  Tagged red cell scan was negative for active GI bleeding  Coag studies  WNL Had received IV iron . Hemoglobin gradually improving after starting diuretics again.   # Acute metabolic encephalopathy:  Secondary to hypercapnia. Worsening again with increased CO2 level.   # COPD exacerbation:  Has completed steroid taper Continue with bronchodilator and supportive care   # Acute on chronic HFpEF  Still volume overloaded, restarted IV Lasix   together with albumin  9/5.  Patient to be transferred to ICU today, will reassess for daily needs of IV Lasix .    CKD stage 3a, GFR 45-59 ml/min ([name]) Hypokalemia resolved Continue to follow, potassium have normalized     H/o Chronic colitis - Continue home mesalamine  Flexible sigmoidoscopy with GI showed no active colitis of L sided colon   Sleep apnea OHS Post tracheostomy. Will be transferred to ICU for mechanical ventilation   Prolonged Qtc on tele. Continue to monitor.  Avoid qtc prolonging medications.    Hypotension Resolved Midodrine  to 2.5 mg twice a day.  Planning to wean off by tomorrow if blood pressure still stable.   Anxiety, Continue Atarax    Ambulatory dysfunction Pending placement         Subjective:  Patient has become unresponsive this morning, he was on BiPAP through the night.  Physical Exam: Vitals:   12/22/23 0459 12/22/23 0741 12/22/23 0742 12/22/23 0759  BP: 127/73   107/60  Pulse: 95  99 99  Resp: 20   14  Temp: 98.8 F (37.1 C)   97.9 F (36.6 C)  TempSrc:    Axillary  SpO2: 100% 94% 94% 95%  Weight:      Height:       General exam: Appears calm and comfortable, morbid obese. Respiratory system: Decreased breath sounds. Respiratory effort normal. Cardiovascular system: S1 & S2 heard, RRR. No JVD, murmurs, rubs, gallops or clicks. Gastrointestinal system: Abdomen is nondistended, soft and nontender. No organomegaly or masses felt. Normal bowel sounds heard. Central nervous system: Responsive to painful stimuli. Extremities: 3+ leg edema Skin: No rashes, lesions or ulcers   Data Reviewed:  Lab results reviewed.  Family Communication: sister updated over the phone  Disposition: Status is: Inpatient      CRITICAL CARE Performed by: Murvin Mana   Total critical care time: 50 minutes  Critical care time was exclusive of separately billable procedures and treating other patients.  Critical care was necessary to  treat or prevent imminent or life-threatening deterioration.  Critical care was time spent personally by me on the following activities: development of treatment plan with patient and/or surrogate as well as nursing, discussions with consultants, evaluation of patient's response to treatment, examination of patient, obtaining history from patient or surrogate, ordering and performing treatments and interventions, ordering and review of laboratory studies, ordering and review of radiographic studies, pulse oximetry and re-evaluation of patient's condition.    Author: Murvin Mana, MD 12/22/2023 9:35 AM  For on call review [email or web address].

## 2023-12-22 NOTE — Progress Notes (Signed)
 RT assisted with patient transfer to ICU bed 11 with no complications. Pt transported on V60 Bipap

## 2023-12-22 NOTE — Plan of Care (Addendum)
 TRACH CHANGED TO #6 XLT SHILEY CUFFED  Significant amounts of purulent secretions suctioned using saline flushes.  Patient placed on vent, has high peak airway pressure. Plan for aggressive saline flushes and suctioning per RT protocol

## 2023-12-22 NOTE — Progress Notes (Signed)
   12/22/23 0749  Assess: MEWS Score  Level of Consciousness Responds to Pain  Assess: MEWS Score  MEWS Temp 0  MEWS Systolic 0  MEWS Pulse 0  MEWS RR 0  MEWS LOC 2  MEWS Score 2  MEWS Score Color Yellow  Assess: if the MEWS score is Yellow or Red  Were vital signs accurate and taken at a resting state? Yes  Does the patient meet 2 or more of the SIRS criteria? No  MEWS guidelines implemented  Yes, yellow  Treat  MEWS Interventions Considered administering scheduled or prn medications/treatments as ordered  Take Vital Signs  Increase Vital Sign Frequency  Yellow: Q2hr x1, continue Q4hrs until patient remains green for 12hrs  Escalate  MEWS: Escalate Yellow: Discuss with charge nurse and consider notifying provider and/or RRT  Notify: Charge Nurse/RN  Name of Charge Nurse/RN Notified Rosina, RN  Provider Notification  Provider Name/Title Dr. Laurita  Date Provider Notified 12/22/23  Time Provider Notified [phone]  Method of Notification  (secure chat)  Notification Reason Other (Comment) (yellow MEWS/LOC)  Provider response Other (Comment);See new orders (secure chat)  Date of Provider Response 12/22/23  Time of Provider Response [phone]   Patient arouses only to sternal rub. Vital signs stable, NSR and 90% o2 sat on bipap at 45% o2. Dr. Laurita placed order for stat ABG. RN called Clotilda, RT and made her aware.

## 2023-12-22 NOTE — Progress Notes (Signed)
 Transferred to ICU by bed with this RN and RT. Patient transferred on BiPAP. Corean, RN at bedside in new room.

## 2023-12-22 NOTE — Consult Note (Signed)
 NAME:  Gary [name], MRN:  [id number], DOB:  1955-12-13, LOS: 24 ADMISSION DATE:  11/28/2023  CHIEF COMPLAINT:  resp failure  History of Present Illness:   68 y.o. male with medical history significant of heart failure preserved ejection fraction, hx of  DVT s/p treatment , AAA, COPD, hypertension, anemia, chronic hypoxic and hypercapnic respiratory failure on chronic trach (Shiley XLT #7 un-cuffed) on [place] 5-6L during the day and bipap at bedtime. Patient  presents to ED  from  rehab facility in [place]. EMS was called due sat in 70's per notes patient uses Bipap but facility does not provide this therapy.    Patient was found to be with acute on chronic hypoxic and hypercapnic respiratory failure and was placed on BiPAP.  Labs with worsening anemia with hemoglobin at 7.6.  Received IV Lasix , he also received IV iron .     He had multiple maroon colored Bms with clot mixed in.  He received 1 units of PRBC, seen by GI, not recommended any additional workup.   Patient has restarted IV Lasix  since 9/5 due to volume overload.  He developed worsening respite failure with an responsiveness.  ABG showed severe CO2 retention, patient  transferred to ICU 9/7.  Significant Hospital Events: Including procedures, antibiotic start and stop dates in addition to other pertinent events   9/7 admitted to ICU for worsening resp failure Unable to find cuffed trach, awaiting tubes to arrive from [facility], will proceed with oral intubation if needed    Interim History / Subjective:  Critically ill Prognosis is poor Airway compromised    Objective   Blood pressure 101/60, pulse 86, temperature (!) 97.4 F (36.3 C), temperature source Axillary, resp. rate (!) 22, height 6' 4 (1.93 m), weight (!) 197.5 kg, SpO2 91%.    FiO2 (%):  [40 %-60 %] 50 %   Intake/Output Summary (Last 24 hours) at 12/22/2023 1402 Last data filed at 12/22/2023 0440 Gross per 24 hour  Intake --  Output 2200 ml  Net -2200 ml   Filed  Weights   11/28/23 1347 12/07/23 1131 12/13/23 0900  Weight: (!) 197.5 kg 89.6 kg (!) 197.5 kg    REVIEW OF SYSTEMS  PATIENT IS UNABLE TO PROVIDE COMPLETE REVIEW OF SYSTEMS DUE TO SEVERE CRITICAL ILLNESS   PHYSICAL EXAMINATION:  GENERAL:critically ill appearing, +resp distress EYES: Pupils equal, round, reactive to light.  No scleral icterus.  MOUTH: Moist mucosal membrane. Trach on BiPAP NECK: Supple.  PULMONARY: Lungs clear to auscultation, +rhonchi, +wheezing CARDIOVASCULAR: S1 and S2.  Regular rate and rhythm GASTROINTESTINAL: Soft, nontender, -distended. Positive bowel sounds.  MUSCULOSKELETAL: No swelling, clubbing, or edema.  NEUROLOGIC: obtunded, SKIN:normal, warm to touch, Capillary refill delayed  Pulses present bilaterally   ASSESSMENT AND PLAN SYNOPSIS  68 yo morbidly obese AAM Severe ACUTE Hypoxic and Hypercapnic Respiratory Failure due to severe end stage sCHF complicated by GIB  Severe ACUTE Hypoxic and Hypercapnic Respiratory Failure Plan for trach change versus oral intubation FiO2 (%):  [40 %-60 %] 50 %  CARDIAC FAILURE-acute combined systolic/diastolic dysfunction -oxygen  as needed -Lasix  as tolerated    CARDIAC ICU monitoring   ACUTE KIDNEY INJURY/Renal Failure -continue Foley Catheter-assess need -Avoid nephrotoxic agents -Follow urine output, BMP -Ensure adequate renal perfusion, optimize oxygenation -Renal dose medications   Intake/Output Summary (Last 24 hours) at 12/22/2023 1402 Last data filed at 12/22/2023 0440 Gross per 24 hour  Intake --  Output 2200 ml  Net -2200 ml  Latest Ref Rng & Units 12/22/2023    4:43 AM 12/21/2023    5:51 AM 12/20/2023    7:31 AM  BMP  Glucose 70 - 99 mg/dL 97  84  88   BUN 8 - 23 mg/dL 17  18  20    Creatinine 0.61 - 1.24 mg/dL 8.44  8.63  8.45   Sodium 135 - 145 mmol/L 144  142  145   Potassium 3.5 - 5.1 mmol/L 3.9  3.5  3.7   Chloride 98 - 111 mmol/L 97  97  99   CO2 22 - 32 mmol/L 40  38  36    Calcium  8.9 - 10.3 mg/dL 7.7  7.6  7.7       NEUROLOGY Acute  metabolic encephalopathy Vent support needed, due to hypoxia and hypercapnia  ENDO - ICU hypoglycemic\Hyperglycemia protocol -check FSBS per protocol   GI GI PROPHYLAXIS as indicated  NUTRITIONAL STATUS DIET-->NPO Constipation protocol as indicated   ELECTROLYTES -follow labs as needed -replace as needed -pharmacy consultation and following     Best practice (right click and Reselect all SmartList Selections daily)  Diet:  NPO Code Status:  FULL CODE Disposition: ICU  Labs   CBC: Recent Labs  Lab 12/16/23 0341 12/17/23 0446 12/18/23 0408 12/19/23 0636 12/21/23 0551  WBC 6.5 5.6 5.7  --   --   NEUTROABS 4.7 3.8 4.1  --   --   HGB 7.8* 8.4* 7.9* 8.3* 8.6*  HCT 29.7* 31.1* 29.3*  --   --   MCV 92.5 92.6 91.6  --   --   PLT 176 188 186  --   --     Basic Metabolic Panel: Recent Labs  Lab 12/18/23 0408 12/19/23 0636 12/20/23 0731 12/21/23 0551 12/22/23 0443  NA 143 144 145 142 144  K 4.0 3.7 3.7 3.5 3.9  CL 101 101 99 97* 97*  CO2 35* 35* 36* 38* 40*  GLUCOSE 78 79 88 84 97  BUN 18 16 20 18 17   CREATININE 1.33* 1.41* 1.54* 1.36* 1.55*  CALCIUM  8.0* 8.0* 7.7* 7.6* 7.7*  MG  --  1.8  --  1.9 1.9   GFR: Estimated Creatinine Clearance: 86.9 mL/min (A) (by C-G formula based on SCr of 1.55 mg/dL (H)). Recent Labs  Lab 12/16/23 0341 12/17/23 0446 12/18/23 0408 12/19/23 0636  PROCALCITON  --   --   --  <0.10  WBC 6.5 5.6 5.7  --     Liver Function Tests: No results for input(s): AST, ALT, ALKPHOS, BILITOT, PROT, ALBUMIN  in the last 168 hours. No results for input(s): LIPASE, AMYLASE in the last 168 hours. No results for input(s): AMMONIA in the last 168 hours.  ABG    Component Value Date/Time   PHART 7.18 (LL) 12/22/2023 1312   PCO2ART >123 (HH) 12/22/2023 1312   PO2ART 55 (L) 12/22/2023 1312   HCO3 46.7 (H) 12/22/2023 1312   TCO2 37 (H) 10/17/2023 0823    ACIDBASEDEF 0.1 10/03/2021 0927   O2SAT 89.1 12/22/2023 1312     Coagulation Profile: No results for input(s): INR, PROTIME in the last 168 hours.  Cardiac Enzymes: No results for input(s): CKTOTAL, CKMB, CKMBINDEX, TROPONINI in the last 168 hours.  HbA1C: Hgb A1c MFr Bld  Date/Time Value Ref Range Status  10/16/2023 01:20 AM 5.0 4.8 - 5.6 % Final    Comment:    (NOTE) Diagnosis of Diabetes The following HbA1c ranges recommended by the American Diabetes Association (ADA) may be  used as an aid in the diagnosis of diabetes mellitus.  Hemoglobin             Suggested A1C NGSP%              Diagnosis  <5.7                   Non Diabetic  5.7-6.4                Pre-Diabetic  >6.4                   Diabetic  <7.0                   Glycemic control for                       adults with diabetes.    09/27/2022 05:35 AM 4.6 (L) 4.8 - 5.6 % Final    Comment:    (NOTE) Pre diabetes:          5.7%-6.4%  Diabetes:              >6.4%  Glycemic control for   <7.0% adults with diabetes     CBG: Recent Labs  Lab 12/22/23 0835 12/22/23 1119  GLUCAP 102* 93    Allergies No Known Allergies     DVT/GI PRX  assessed I Assessed the need for Labs I Assessed the need for Foley I Assessed the need for Central Venous Line Family Discussion when available I Assessed the need for Mobilization I made an Assessment of medications to be adjusted accordingly Safety Risk assessment completed  CASE DISCUSSED IN MULTIDISCIPLINARY ROUNDS WITH ICU TEAM     Critical Care Time devoted to patient care services described in this note is 75 minutes.  Critical care was necessary to treat or prevent imminent or life-threatening deterioration.   PATIENT WITH VERY POOR PROGNOSIS I ANTICIPATE PROLONGED ICU LOS  Patient with Multiorgan failure and at high risk for cardiac arrest and death.    Nickolas Alm Cellar, M.D.  [facility]   Medical Director [facility] Medical Director [facility] Cardio-Pulmonary Department

## 2023-12-23 ENCOUNTER — Inpatient Hospital Stay

## 2023-12-23 LAB — BLOOD GAS, ARTERIAL
Acid-Base Excess: 15.8 mmol/L — ABNORMAL HIGH (ref 0.0–2.0)
Bicarbonate: 42.7 mmol/L — ABNORMAL HIGH (ref 20.0–28.0)
FIO2: 40 %
MECHVT: 500 mL
Mechanical Rate: 16
O2 Saturation: 93.8 %
PEEP: 8 cmH2O
Patient temperature: 37
pCO2 arterial: 60 mmHg — ABNORMAL HIGH (ref 32–48)
pH, Arterial: 7.46 — ABNORMAL HIGH (ref 7.35–7.45)
pO2, Arterial: 57 mmHg — ABNORMAL LOW (ref 83–108)

## 2023-12-23 LAB — CBC
HCT: 32.7 % — ABNORMAL LOW (ref 39.0–52.0)
Hemoglobin: 8.5 g/dL — ABNORMAL LOW (ref 13.0–17.0)
MCH: 23.9 pg — ABNORMAL LOW (ref 26.0–34.0)
MCHC: 26 g/dL — ABNORMAL LOW (ref 30.0–36.0)
MCV: 92.1 fL (ref 80.0–100.0)
Platelets: 94 K/uL — ABNORMAL LOW (ref 150–400)
RBC: 3.55 MIL/uL — ABNORMAL LOW (ref 4.22–5.81)
RDW: 18.1 % — ABNORMAL HIGH (ref 11.5–15.5)
WBC: 5.3 K/uL (ref 4.0–10.5)
nRBC: 0.6 % — ABNORMAL HIGH (ref 0.0–0.2)

## 2023-12-23 LAB — HEMOGLOBIN AND HEMATOCRIT, BLOOD
HCT: 30.2 % — ABNORMAL LOW (ref 39.0–52.0)
Hemoglobin: 8.5 g/dL — ABNORMAL LOW (ref 13.0–17.0)

## 2023-12-23 LAB — BASIC METABOLIC PANEL WITH GFR
Anion gap: 11 (ref 5–15)
BUN: 20 mg/dL (ref 8–23)
CO2: 36 mmol/L — ABNORMAL HIGH (ref 22–32)
Calcium: 7.7 mg/dL — ABNORMAL LOW (ref 8.9–10.3)
Chloride: 98 mmol/L (ref 98–111)
Creatinine, Ser: 1.47 mg/dL — ABNORMAL HIGH (ref 0.61–1.24)
GFR, Estimated: 52 mL/min — ABNORMAL LOW (ref >60)
Glucose, Bld: 73 mg/dL (ref 70–99)
Potassium: 3.7 mmol/L (ref 3.5–5.1)
Sodium: 145 mmol/L (ref 135–145)

## 2023-12-23 LAB — GLUCOSE, CAPILLARY
Glucose-Capillary: 70 mg/dL (ref 70–99)
Glucose-Capillary: 81 mg/dL (ref 70–99)
Glucose-Capillary: 91 mg/dL (ref 70–99)
Glucose-Capillary: 81 mg/dL (ref 70–99)
Glucose-Capillary: 80 mg/dL (ref 70–99)
Glucose-Capillary: 76 mg/dL (ref 70–99)

## 2023-12-23 LAB — TRIGLYCERIDES: Triglycerides: 346 mg/dL — ABNORMAL HIGH (ref <150)

## 2023-12-23 LAB — PROCALCITONIN: Procalcitonin: 0.11 ng/mL

## 2023-12-23 LAB — MAGNESIUM: Magnesium: 1.7 mg/dL (ref 1.7–2.4)

## 2023-12-23 MED ORDER — STERILE WATER FOR INJECTION IJ SOLN
INTRAMUSCULAR | Status: AC
  Administered 2023-12-23: 5 mL
  Filled 2023-12-23: qty 10

## 2023-12-23 MED ORDER — ACETAZOLAMIDE SODIUM 500 MG IJ SOLR
500.0000 mg | Freq: Once | INTRAMUSCULAR | Status: AC
  Administered 2023-12-23: 500 mg via INTRAVENOUS
  Filled 2023-12-23: qty 500

## 2023-12-23 MED ORDER — MAGNESIUM SULFATE 2 GM/50ML IV SOLN
2.0000 g | Freq: Once | INTRAVENOUS | Status: AC
  Administered 2023-12-23: 2 g via INTRAVENOUS
  Filled 2023-12-23: qty 50

## 2023-12-23 MED ORDER — POTASSIUM CHLORIDE 10 MEQ/100ML IV SOLN
10.0000 meq | INTRAVENOUS | Status: AC
  Administered 2023-12-23 (×2): 10 meq via INTRAVENOUS
  Filled 2023-12-23 (×2): qty 100

## 2023-12-23 MED ORDER — FUROSEMIDE 10 MG/ML IJ SOLN
40.0000 mg | Freq: Once | INTRAMUSCULAR | Status: AC
  Administered 2023-12-23: 40 mg via INTRAVENOUS
  Filled 2023-12-23: qty 4

## 2023-12-23 NOTE — Progress Notes (Signed)
 Initial Nutrition Assessment  DOCUMENTATION CODES:   Morbid obesity  INTERVENTION:   Recommend NGT placement and nutrition support if pt remains ventilated more than 24-48 hrs  If tube feeds initiated, recommend:  Vital HP@20ml /hr + ProSource TF 20- Give 120ml TID via tube.   Free water  flushes 30ml q4 hours to maintain tube patency   Propofol : 59.3 ml/hr- provides 1565kcal/day   Regimen provides 2525kcal/day, 162g/day protein and 557ml/day of free water .   Juven Fruit Punch BID via tube, each serving provides 95kcal and 2.5g of protein (amino acids  glutamine and arginine)  Daily weights   NUTRITION DIAGNOSIS:   Inadequate oral intake related to inability to eat (pt ventilated) as evidenced by NPO status.  GOAL:   Provide needs based on ASPEN/SCCM guidelines  MONITOR:   Diet advancement, Vent status, Labs, Weight trends, I & O's, Skin  REASON FOR ASSESSMENT:   Ventilator    ASSESSMENT:   68 y/o male with h/o PUD, OSA, HTN, CKD III, substance abuse, IDA, CHF, Hep C, COPD, AAA, diverticulosis, COVID 19 (02/2021), DVT, chronic hypoxic and hypercapnic respiratory failure and a lengthy admission for COPD/CHF exacerbation (s/p tracheostomy 10/03/21 and s/p G-tube 10/13/21 (now removed)) and who is now admitted with GIB (likely diverticular), AKI, CHF exacerbation and respiratory failure requiring ventilation 9/7.  Pt sedated and ventilated via tracheostomy. Per chart, pt has been eating well during his admission. Will plan to place NGT and nutrition support if patient remains ventilated more than 48 hrs. Pt is at refeed risk. Per chart, pt appears to be up ~8lbs since admission.   Medications reviewed and include: colace, trigels-F Forte, midodrine , mesalamine , protonix , miralax , KCl, propofol    Labs reviewed: K 3.7 wnl, creat 1.47(H), Mg 1.7 wnl Iron  19(L), TIBC 305, ferritin 10(L), folate 12.0 wnl, B12 389 wnl- 8/14 Vitamin D  32.23 wnl- 8/18 Hgb 8.5(L), Hct  32.7(L) Cbgs- 91, 81, 70 x 24 hrs  AIC 5.0- 7/2  Patient is currently intubated on ventilator support MV: 7.7 L/min Temp (24hrs), Avg:98.2 F (36.8 C), Min:97.9 F (36.6 C), Max:98.5 F (36.9 C)  Propofol : 59.3 ml/hr- provides 1565kcal/day   MAP >62mmHg   UOP-   NUTRITION - FOCUSED PHYSICAL EXAM:  Flowsheet Row Most Recent Value  Orbital Region No depletion  Upper Arm Region No depletion  Thoracic and Lumbar Region No depletion  Buccal Region No depletion  Temple Region No depletion  Clavicle Bone Region No depletion  Clavicle and Acromion Bone Region No depletion  Scapular Bone Region No depletion  Dorsal Hand No depletion  Patellar Region No depletion  Anterior Thigh Region No depletion  Posterior Calf Region Unable to assess  Edema (RD Assessment) Moderate  Hair Reviewed  Eyes Reviewed  Mouth Reviewed  Skin Reviewed  Nails Reviewed   Diet Order:   Diet Order             Diet NPO time specified  Diet effective now                  EDUCATION NEEDS:   Not appropriate for education at this time  Skin:  Skin Assessment: Reviewed RN Assessment (Stage III buttocks)  Last BM:  9/7- type 6  Height:   Ht Readings from Last 1 Encounters:  12/07/23 6' 4 (1.93 m)    Weight:   Wt Readings from Last 1 Encounters:  12/13/23 (!) 197.5 kg    Ideal Body Weight:  91.8 kg  BMI:  Body mass index is 53 kg/m.  Estimated Nutritional Needs:   Kcal:  2000-2300kcal/day  Protein:  >185g/day  Fluid:  2.0L/day  Augustin Shams MS, RD, LDN If unable to be reached, please send secure chat to RD inpatient available from 8:00a-4:00p daily

## 2023-12-23 NOTE — Plan of Care (Signed)
  Problem: Coping: Goal: Level of anxiety will decrease Outcome: Progressing   Problem: Elimination: Goal: Will not experience complications related to bowel motility Outcome: Progressing Goal: Will not experience complications related to urinary retention Outcome: Progressing   Problem: Pain Managment: Goal: General experience of comfort will improve and/or be controlled Outcome: Progressing   Problem: Safety: Goal: Ability to remain free from injury will improve Outcome: Progressing   Problem: Skin Integrity: Goal: Risk for impaired skin integrity will decrease Outcome: Progressing   Problem: Respiratory: Goal: Levels of oxygenation will improve Outcome: Progressing

## 2023-12-23 NOTE — Progress Notes (Signed)
 Pt SPO2 decreased during MetaNeb treatment. MetaNeb stopped.

## 2023-12-23 NOTE — Progress Notes (Signed)
 NAME:  AJAHNI NAY, MRN:  [id number], DOB:  02-May-1955, LOS: 25 ADMISSION DATE:  11/28/2023  CHIEF COMPLAINT:  resp failure  History of Present Illness:   68 y.o. male with medical history significant of heart failure preserved ejection fraction, hx of  DVT s/p treatment , AAA, COPD, hypertension, anemia, chronic hypoxic and hypercapnic respiratory failure on chronic trach (Shiley XLT #7 un-cuffed) on [place] 5-6L during the day and bipap at bedtime. Patient  presents to ED  from  rehab facility in [place]. EMS was called due sat in 70's per notes patient uses Bipap but facility does not provide this therapy.    Patient was found to be with acute on chronic hypoxic and hypercapnic respiratory failure and was placed on BiPAP.  Labs with worsening anemia with hemoglobin at 7.6.  Received IV Lasix , he also received IV iron .     He had multiple maroon colored Bms with clot mixed in.  He received 1 units of PRBC, seen by GI, not recommended any additional workup.   Patient has restarted IV Lasix  since 9/5 due to volume overload.  He developed worsening respite failure with an responsiveness.  ABG showed severe CO2 retention, patient  transferred to ICU 9/7.  Significant Hospital Events: Including procedures, antibiotic start and stop dates in addition to other pertinent events   9/7 admitted to ICU for worsening resp failure Unable to find cuffed trach, awaiting tubes to arrive from [facility], will proceed with oral intubation if needed  12/23/23- patient on PRVC with fio2 50% and moderate resp secretions, plan to perform SBT and wean from MV.  He does have GI bleed and were monitoring H/h.  His trache has been replaced to cuffed from cuffless.  He responds to voice.  He is with pulmonary edema and multifocal infiltrates.   Interim History / Subjective:  Critically ill Prognosis is poor Airway compromised    Objective   Blood pressure 114/68, pulse 71, temperature 98 F (36.7 C), temperature source  Axillary, resp. rate 16, height 6' 4 (1.93 m), weight (!) 197.5 kg, SpO2 93%.    Vent Mode: PRVC FiO2 (%):  [40 %-60 %] 40 % Set Rate:  [16 bmp] 16 bmp Vt Set:  [500 mL] 500 mL PEEP:  [8 cmH20] 8 cmH20   Intake/Output Summary (Last 24 hours) at 12/23/2023 0852 Last data filed at 12/23/2023 0700 Gross per 24 hour  Intake 787.14 ml  Output --  Net 787.14 ml   Filed Weights   11/28/23 1347 12/07/23 1131 12/13/23 0900  Weight: (!) 197.5 kg 89.6 kg (!) 197.5 kg    REVIEW OF SYSTEMS  PATIENT IS UNABLE TO PROVIDE COMPLETE REVIEW OF SYSTEMS DUE TO SEVERE CRITICAL ILLNESS   PHYSICAL EXAMINATION:  GENERAL:critically ill appearing, +resp distress EYES: Pupils equal, round, reactive to light.  No scleral icterus.  MOUTH: Moist mucosal membrane. Trach on BiPAP NECK: Supple.  PULMONARY: Lungs clear to auscultation, +rhonchi, +wheezing CARDIOVASCULAR: S1 and S2.  Regular rate and rhythm GASTROINTESTINAL: Soft, nontender, -distended. Positive bowel sounds.  MUSCULOSKELETAL: No swelling, clubbing, or edema.  NEUROLOGIC: obtunded, SKIN:normal, warm to touch, Capillary refill delayed  Pulses present bilaterally   ASSESSMENT AND PLAN SYNOPSIS  68 yo morbidly obese AAM Severe ACUTE Hypoxic and Hypercapnic Respiratory Failure due to severe end stage sCHF complicated by GIB  Severe ACUTE Hypoxic and Hypercapnic Respiratory Failure Plan for trach change versus oral intubation Vent Mode: PRVC FiO2 (%):  [40 %-60 %] 40 % Set Rate:  [  16 bmp] 16 bmp Vt Set:  [500 mL] 500 mL PEEP:  [8 cmH20] 8 cmH20  CARDIAC FAILURE-acute combined systolic/diastolic dysfunction -oxygen  as needed -Lasix  as tolerated    CARDIAC ICU monitoring   ACUTE KIDNEY INJURY/Renal Failure -continue Foley Catheter-assess need -Avoid nephrotoxic agents -Follow urine output, BMP -Ensure adequate renal perfusion, optimize oxygenation -Renal dose medications   Intake/Output Summary (Last 24 hours) at 12/23/2023  0852 Last data filed at 12/23/2023 0700 Gross per 24 hour  Intake 787.14 ml  Output --  Net 787.14 ml      Latest Ref Rng & Units 12/23/2023    4:41 AM 12/22/2023    4:43 AM 12/21/2023    5:51 AM  BMP  Glucose 70 - 99 mg/dL 73  97  84   BUN 8 - 23 mg/dL 20  17  18    Creatinine 0.61 - 1.24 mg/dL 8.52  8.44  8.63   Sodium 135 - 145 mmol/L 145  144  142   Potassium 3.5 - 5.1 mmol/L 3.7  3.9  3.5   Chloride 98 - 111 mmol/L 98  97  97   CO2 22 - 32 mmol/L 36  40  38   Calcium  8.9 - 10.3 mg/dL 7.7  7.7  7.6       NEUROLOGY Acute  metabolic encephalopathy Vent support needed, due to hypoxia and hypercapnia  ENDO - ICU hypoglycemic\Hyperglycemia protocol -check FSBS per protocol   GI GI PROPHYLAXIS as indicated  NUTRITIONAL STATUS DIET-->NPO Constipation protocol as indicated   ELECTROLYTES -follow labs as needed -replace as needed -pharmacy consultation and following     Best practice (right click and Reselect all SmartList Selections daily)  Diet:  NPO Code Status:  FULL CODE Disposition: ICU  Labs   CBC: Recent Labs  Lab 12/17/23 0446 12/18/23 0408 12/19/23 0636 12/21/23 0551 12/23/23 0441  WBC 5.6 5.7  --   --  5.3  NEUTROABS 3.8 4.1  --   --   --   HGB 8.4* 7.9* 8.3* 8.6* 8.5*  HCT 31.1* 29.3*  --   --  32.7*  MCV 92.6 91.6  --   --  92.1  PLT 188 186  --   --  94*    Basic Metabolic Panel: Recent Labs  Lab 12/19/23 0636 12/20/23 0731 12/21/23 0551 12/22/23 0443 12/23/23 0441  NA 144 145 142 144 145  K 3.7 3.7 3.5 3.9 3.7  CL 101 99 97* 97* 98  CO2 35* 36* 38* 40* 36*  GLUCOSE 79 88 84 97 73  BUN 16 20 18 17 20   CREATININE 1.41* 1.54* 1.36* 1.55* 1.47*  CALCIUM  8.0* 7.7* 7.6* 7.7* 7.7*  MG 1.8  --  1.9 1.9 1.7   GFR: Estimated Creatinine Clearance: 91.7 mL/min (A) (by C-G formula based on SCr of 1.47 mg/dL (H)). Recent Labs  Lab 12/17/23 0446 12/18/23 0408 12/19/23 0636 12/23/23 0441 12/23/23 0708  PROCALCITON  --   --  <0.10   --  0.11  WBC 5.6 5.7  --  5.3  --     Liver Function Tests: No results for input(s): AST, ALT, ALKPHOS, BILITOT, PROT, ALBUMIN  in the last 168 hours. No results for input(s): LIPASE, AMYLASE in the last 168 hours. No results for input(s): AMMONIA in the last 168 hours.  ABG    Component Value Date/Time   PHART 7.46 (H) 12/23/2023 0628   PCO2ART 60 (H) 12/23/2023 0628   PO2ART 57 (L) 12/23/2023 9371  HCO3 42.7 (H) 12/23/2023 0628   TCO2 37 (H) 10/17/2023 0823   ACIDBASEDEF 0.1 10/03/2021 0927   O2SAT 93.8 12/23/2023 0628     Coagulation Profile: No results for input(s): INR, PROTIME in the last 168 hours.  Cardiac Enzymes: No results for input(s): CKTOTAL, CKMB, CKMBINDEX, TROPONINI in the last 168 hours.  HbA1C: Hgb A1c MFr Bld  Date/Time Value Ref Range Status  10/16/2023 01:20 AM 5.0 4.8 - 5.6 % Final    Comment:    (NOTE) Diagnosis of Diabetes The following HbA1c ranges recommended by the American Diabetes Association (ADA) may be used as an aid in the diagnosis of diabetes mellitus.  Hemoglobin             Suggested A1C NGSP%              Diagnosis  <5.7                   Non Diabetic  5.7-6.4                Pre-Diabetic  >6.4                   Diabetic  <7.0                   Glycemic control for                       adults with diabetes.    09/27/2022 05:35 AM 4.6 (L) 4.8 - 5.6 % Final    Comment:    (NOTE) Pre diabetes:          5.7%-6.4%  Diabetes:              >6.4%  Glycemic control for   <7.0% adults with diabetes     CBG: Recent Labs  Lab 12/22/23 2034 12/22/23 2107 12/22/23 2334 12/23/23 0348 12/23/23 0742  GLUCAP 61* 84 72 70 81    Allergies No Known Allergies     DVT/GI PRX  assessed I Assessed the need for Labs I Assessed the need for Foley I Assessed the need for Central Venous Line Family Discussion when available I Assessed the need for Mobilization I made an Assessment of  medications to be adjusted accordingly Safety Risk assessment completed  CASE DISCUSSED IN MULTIDISCIPLINARY ROUNDS WITH ICU TEAM      Critical care was necessary to treat or prevent imminent or life-threatening deterioration.   PATIENT WITH VERY POOR PROGNOSIS I ANTICIPATE PROLONGED ICU LOS  Patient with Multiorgan failure and at high risk for cardiac arrest and death.   Critical care provider statement:   Total critical care time: 33 minutes   Performed by: Parris MD   Critical care time was exclusive of separately billable procedures and treating other patients.   Critical care was necessary to treat or prevent imminent or life-threatening deterioration.   Critical care was time spent personally by me on the following activities: development of treatment plan with patient and/or surrogate as well as nursing, discussions with consultants, evaluation of patient's response to treatment, examination of patient, obtaining history from patient or surrogate, ordering and performing treatments and interventions, ordering and review of laboratory studies, ordering and review of radiographic studies, pulse oximetry and re-evaluation of patient's condition.    Genoveva Singleton, M.D.  [facility] Medicine

## 2023-12-23 NOTE — Plan of Care (Signed)

## 2023-12-24 LAB — CBC
HCT: 31.3 % — ABNORMAL LOW (ref 39.0–52.0)
Hemoglobin: 8.8 g/dL — ABNORMAL LOW (ref 13.0–17.0)
MCH: 24.7 pg — ABNORMAL LOW (ref 26.0–34.0)
MCHC: 28.1 g/dL — ABNORMAL LOW (ref 30.0–36.0)
MCV: 87.9 fL (ref 80.0–100.0)
Platelets: 114 K/uL — ABNORMAL LOW (ref 150–400)
RBC: 3.56 MIL/uL — ABNORMAL LOW (ref 4.22–5.81)
RDW: 18.8 % — ABNORMAL HIGH (ref 11.5–15.5)
WBC: 4 K/uL (ref 4.0–10.5)
nRBC: 0.8 % — ABNORMAL HIGH (ref 0.0–0.2)

## 2023-12-24 LAB — BASIC METABOLIC PANEL WITH GFR
Anion gap: 10 (ref 5–15)
BUN: 21 mg/dL (ref 8–23)
CO2: 35 mmol/L — ABNORMAL HIGH (ref 22–32)
Calcium: 7.7 mg/dL — ABNORMAL LOW (ref 8.9–10.3)
Chloride: 97 mmol/L — ABNORMAL LOW (ref 98–111)
Creatinine, Ser: 1.46 mg/dL — ABNORMAL HIGH (ref 0.61–1.24)
GFR, Estimated: 53 mL/min — ABNORMAL LOW (ref >60)
Glucose, Bld: 79 mg/dL (ref 70–99)
Potassium: 2.9 mmol/L — ABNORMAL LOW (ref 3.5–5.1)
Sodium: 142 mmol/L (ref 135–145)

## 2023-12-24 LAB — GLUCOSE, CAPILLARY
Glucose-Capillary: 82 mg/dL (ref 70–99)
Glucose-Capillary: 76 mg/dL (ref 70–99)
Glucose-Capillary: 118 mg/dL — ABNORMAL HIGH (ref 70–99)
Glucose-Capillary: 74 mg/dL (ref 70–99)
Glucose-Capillary: 69 mg/dL — ABNORMAL LOW (ref 70–99)
Glucose-Capillary: 131 mg/dL — ABNORMAL HIGH (ref 70–99)
Glucose-Capillary: 79 mg/dL (ref 70–99)
Glucose-Capillary: 72 mg/dL (ref 70–99)
Glucose-Capillary: 75 mg/dL (ref 70–99)

## 2023-12-24 LAB — PHOSPHORUS: Phosphorus: 2.4 mg/dL — ABNORMAL LOW (ref 2.5–4.6)

## 2023-12-24 LAB — MAGNESIUM: Magnesium: 2.3 mg/dL (ref 1.7–2.4)

## 2023-12-24 LAB — PROCALCITONIN: Procalcitonin: 0.13 ng/mL

## 2023-12-24 LAB — POTASSIUM: Potassium: 3.6 mmol/L (ref 3.5–5.1)

## 2023-12-24 MED ORDER — POTASSIUM CHLORIDE 10 MEQ/100ML IV SOLN
10.0000 meq | INTRAVENOUS | Status: AC
Start: 2023-12-24 — End: 2023-12-24
  Administered 2023-12-24 (×6): 10 meq via INTRAVENOUS
  Filled 2023-12-24 (×6): qty 100

## 2023-12-24 MED ORDER — ACETAZOLAMIDE SODIUM 500 MG IJ SOLR
500.0000 mg | Freq: Once | INTRAMUSCULAR | Status: AC
Start: 2023-12-24 — End: 2023-12-24
  Administered 2023-12-24: 500 mg via INTRAVENOUS
  Filled 2023-12-24: qty 500

## 2023-12-24 MED ORDER — FUROSEMIDE 10 MG/ML IJ SOLN
40.0000 mg | Freq: Once | INTRAMUSCULAR | Status: AC
Start: 2023-12-24 — End: 2023-12-24
  Administered 2023-12-24: 40 mg via INTRAVENOUS
  Filled 2023-12-24: qty 4

## 2023-12-24 MED ORDER — DEXTROSE 50 % IV SOLN
INTRAVENOUS | Status: AC
  Filled 2023-12-24: qty 50

## 2023-12-24 MED ORDER — POTASSIUM PHOSPHATES 15 MMOLE/5ML IV SOLN
21.0000 mmol | Freq: Once | INTRAVENOUS | Status: AC
  Administered 2023-12-24: 21 mmol via INTRAVENOUS
  Filled 2023-12-24: qty 7

## 2023-12-24 MED ORDER — DEXTROSE 50 % IV SOLN
INTRAVENOUS | Status: AC
  Administered 2023-12-24: 50 mL via INTRAVENOUS
  Filled 2023-12-24: qty 50

## 2023-12-24 MED ORDER — DEXTROSE 50 % IV SOLN
1.0000 | Freq: Once | INTRAVENOUS | Status: AC
  Administered 2023-12-24: 50 mL via INTRAVENOUS

## 2023-12-24 MED ORDER — DEXTROSE 50 % IV SOLN: 1.0000 | Freq: Once | INTRAVENOUS | Status: AC

## 2023-12-24 MED ORDER — STERILE WATER FOR INJECTION IJ SOLN
INTRAMUSCULAR | Status: AC
  Administered 2023-12-24: 10 mL
  Filled 2023-12-24: qty 10

## 2023-12-24 NOTE — Plan of Care (Signed)
  Problem: Clinical Measurements: Goal: Ability to maintain clinical measurements within normal limits will improve Outcome: Progressing Goal: Diagnostic test results will improve Outcome: Progressing Goal: Respiratory complications will improve Outcome: Progressing Goal: Cardiovascular complication will be avoided Outcome: Progressing   Problem: Coping: Goal: Level of anxiety will decrease Outcome: Progressing   Problem: Elimination: Goal: Will not experience complications related to urinary retention Outcome: Progressing   Problem: Pain Managment: Goal: General experience of comfort will improve and/or be controlled Outcome: Progressing

## 2023-12-24 NOTE — Progress Notes (Signed)
 PT Cancellation Note  Patient Details Name: BASEM YANNUZZI MRN: [id number] DOB: [birth year]   Cancelled Treatment:    Reason Eval/Treat Not Completed: Medical issues which prohibited therapy (Chart reviewed - pt has transitioned to higher level of care due to hypercapnia/resp failure. Per therapy protocols, will require new orders to initiate therapy services. Will sign off and await new orders to reevaluate when pt medically appropriate.)   Teague Goynes H. Delores, PT, DPT, NCS 12/24/23, 10:00 PM [phone]

## 2023-12-24 NOTE — Progress Notes (Signed)
 NAME:  Gary [name], MRN:  [id number], DOB:  April 07, 1956, LOS: 26 ADMISSION DATE:  11/28/2023  CHIEF COMPLAINT:  resp failure  History of Present Illness:   68 y.o. male with medical history significant of heart failure preserved ejection fraction, hx of  DVT s/p treatment , AAA, COPD, hypertension, anemia, chronic hypoxic and hypercapnic respiratory failure on chronic trach (Shiley XLT #7 un-cuffed) on [place] 5-6L during the day and bipap at bedtime. Patient  presents to ED  from  rehab facility in [place]. EMS was called due sat in 70's per notes patient uses Bipap but facility does not provide this therapy.    Patient was found to be with acute on chronic hypoxic and hypercapnic respiratory failure and was placed on BiPAP.  Labs with worsening anemia with hemoglobin at 7.6.  Received IV Lasix , he also received IV iron .     He had multiple maroon colored Bms with clot mixed in.  He received 1 units of PRBC, seen by GI, not recommended any additional workup.   Patient has restarted IV Lasix  since 9/5 due to volume overload.  He developed worsening respite failure with an responsiveness.  ABG showed severe CO2 retention, patient  transferred to ICU 9/7.  Significant Hospital Events: Including procedures, antibiotic start and stop dates in addition to other pertinent events   9/7 admitted to ICU for worsening resp failure Unable to find cuffed trach, awaiting tubes to arrive from [facility], will proceed with oral intubation if needed  12/23/23- patient on PRVC with fio2 50% and moderate resp secretions, plan to perform SBT and wean from MV.  He does have GI bleed and were monitoring H/h.  His trache has been replaced to cuffed from cuffless.  He responds to voice.  He is with pulmonary edema and multifocal infiltrates.  12/24/23- patient on 12/5 SBT at this moment.  He is midly improved but oliguric.  We failed SBT attempt today due to hypoxemia.   Interim History / Subjective:  Critically ill Prognosis  is poor Airway compromised    Objective   Blood pressure (!) 84/53, pulse 66, temperature (!) 97.5 F (36.4 C), temperature source Oral, resp. rate 17, height 6' 4 (1.93 m), weight (!) 197.5 kg, SpO2 95%.    Vent Mode: PRVC FiO2 (%):  [40 %-50 %] 40 % Set Rate:  [16 bmp] 16 bmp Vt Set:  [500 mL] 500 mL PEEP:  [5 cmH20-8 cmH20] 5 cmH20   Intake/Output Summary (Last 24 hours) at 12/24/2023 0853 Last data filed at 12/24/2023 0815 Gross per 24 hour  Intake 1737.62 ml  Output 3300 ml  Net -1562.38 ml   Filed Weights   11/28/23 1347 12/07/23 1131 12/13/23 0900  Weight: (!) 197.5 kg 89.6 kg (!) 197.5 kg    REVIEW OF SYSTEMS  PATIENT IS UNABLE TO PROVIDE COMPLETE REVIEW OF SYSTEMS DUE TO SEVERE CRITICAL ILLNESS   PHYSICAL EXAMINATION:  GENERAL:critically ill appearing, +resp distress EYES: Pupils equal, round, reactive to light.  No scleral icterus.  MOUTH: Moist mucosal membrane. Trach on BiPAP NECK: Supple.  PULMONARY: Lungs clear to auscultation, +rhonchi, +wheezing CARDIOVASCULAR: S1 and S2.  Regular rate and rhythm GASTROINTESTINAL: Soft, nontender, -distended. Positive bowel sounds.  MUSCULOSKELETAL: No swelling, clubbing, or edema.  NEUROLOGIC: obtunded, SKIN:normal, warm to touch, Capillary refill delayed  Pulses present bilaterally   ASSESSMENT AND PLAN SYNOPSIS  68 yo morbidly obese AAM Severe ACUTE Hypoxic and Hypercapnic Respiratory Failure due to severe end stage sCHF complicated by GIB  Severe ACUTE Hypoxic and Hypercapnic Respiratory Failure Plan for trach change versus oral intubation Vent Mode: PRVC FiO2 (%):  [40 %-50 %] 40 % Set Rate:  [16 bmp] 16 bmp Vt Set:  [500 mL] 500 mL PEEP:  [5 cmH20-8 cmH20] 5 cmH20   -plant to wean from ventilatory today  CARDIAC FAILURE-acute combined systolic/diastolic dysfunction -oxygen  as needed -Lasix  as tolerated    CARDIAC ICU monitoring   Chronic renal failure stage 3A     - diureseis -continue Foley  Catheter-assess need -Avoid nephrotoxic agents -Follow urine output, BMP -Ensure adequate renal perfusion, optimize oxygenation -Renal dose medications   Intake/Output Summary (Last 24 hours) at 12/24/2023 0853 Last data filed at 12/24/2023 0815 Gross per 24 hour  Intake 1737.62 ml  Output 3300 ml  Net -1562.38 ml      Latest Ref Rng & Units 12/24/2023    5:43 AM 12/23/2023    4:41 AM 12/22/2023    4:43 AM  BMP  Glucose 70 - 99 mg/dL 79  73  97   BUN 8 - 23 mg/dL 21  20  17    Creatinine 0.61 - 1.24 mg/dL 8.53  8.52  8.44   Sodium 135 - 145 mmol/L 142  145  144   Potassium 3.5 - 5.1 mmol/L 2.9  3.7  3.9   Chloride 98 - 111 mmol/L 97  98  97   CO2 22 - 32 mmol/L 35  36  40   Calcium  8.9 - 10.3 mg/dL 7.7  7.7  7.7       NEUROLOGY Acute  metabolic encephalopathy Vent support needed, due to hypoxia and hypercapnia  ENDO - ICU hypoglycemic\Hyperglycemia protocol -check FSBS per protocol   GI GI PROPHYLAXIS as indicated  NUTRITIONAL STATUS DIET-->NPO Constipation protocol as indicated   ELECTROLYTES -follow labs as needed -replace as needed -pharmacy consultation and following     Best practice (right click and Reselect all SmartList Selections daily)  Diet:  NPO Code Status:  FULL CODE Disposition: ICU  Labs   CBC: Recent Labs  Lab 12/18/23 0408 12/19/23 0636 12/21/23 0551 12/23/23 0441 12/23/23 2329 12/24/23 0543  WBC 5.7  --   --  5.3  --  4.0  NEUTROABS 4.1  --   --   --   --   --   HGB 7.9* 8.3* 8.6* 8.5* 8.5* 8.8*  HCT 29.3*  --   --  32.7* 30.2* 31.3*  MCV 91.6  --   --  92.1  --  87.9  PLT 186  --   --  94*  --  114*    Basic Metabolic Panel: Recent Labs  Lab 12/19/23 0636 12/20/23 0731 12/21/23 0551 12/22/23 0443 12/23/23 0441 12/24/23 0543  NA 144 145 142 144 145 142  K 3.7 3.7 3.5 3.9 3.7 2.9*  CL 101 99 97* 97* 98 97*  CO2 35* 36* 38* 40* 36* 35*  GLUCOSE 79 88 84 97 73 79  BUN 16 20 18 17 20 21   CREATININE 1.41* 1.54* 1.36*  1.55* 1.47* 1.46*  CALCIUM  8.0* 7.7* 7.6* 7.7* 7.7* 7.7*  MG 1.8  --  1.9 1.9 1.7 2.3  PHOS  --   --   --   --   --  2.4*   GFR: Estimated Creatinine Clearance: 92.3 mL/min (A) (by C-G formula based on SCr of 1.46 mg/dL (H)). Recent Labs  Lab 12/18/23 0408 12/19/23 0636 12/23/23 0441 12/23/23 0708 12/24/23 0543  PROCALCITON  --  <  0.10  --  0.11 0.13  WBC 5.7  --  5.3  --  4.0    Liver Function Tests: No results for input(s): AST, ALT, ALKPHOS, BILITOT, PROT, ALBUMIN  in the last 168 hours. No results for input(s): LIPASE, AMYLASE in the last 168 hours. No results for input(s): AMMONIA in the last 168 hours.  ABG    Component Value Date/Time   PHART 7.46 (H) 12/23/2023 0628   PCO2ART 60 (H) 12/23/2023 0628   PO2ART 57 (L) 12/23/2023 0628   HCO3 42.7 (H) 12/23/2023 0628   TCO2 37 (H) 10/17/2023 0823   ACIDBASEDEF 0.1 10/03/2021 0927   O2SAT 93.8 12/23/2023 0628     Coagulation Profile: No results for input(s): INR, PROTIME in the last 168 hours.  Cardiac Enzymes: No results for input(s): CKTOTAL, CKMB, CKMBINDEX, TROPONINI in the last 168 hours.  HbA1C: Hgb A1c MFr Bld  Date/Time Value Ref Range Status  10/16/2023 01:20 AM 5.0 4.8 - 5.6 % Final    Comment:    (NOTE) Diagnosis of Diabetes The following HbA1c ranges recommended by the American Diabetes Association (ADA) may be used as an aid in the diagnosis of diabetes mellitus.  Hemoglobin             Suggested A1C NGSP%              Diagnosis  <5.7                   Non Diabetic  5.7-6.4                Pre-Diabetic  >6.4                   Diabetic  <7.0                   Glycemic control for                       adults with diabetes.    09/27/2022 05:35 AM 4.6 (L) 4.8 - 5.6 % Final    Comment:    (NOTE) Pre diabetes:          5.7%-6.4%  Diabetes:              >6.4%  Glycemic control for   <7.0% adults with diabetes     CBG: Recent Labs  Lab 12/23/23 1938  12/23/23 2328 12/24/23 0337 12/24/23 0726 12/24/23 0832  GLUCAP 80 76 82 76 118*    Allergies No Known Allergies     DVT/GI PRX  assessed I Assessed the need for Labs I Assessed the need for Foley I Assessed the need for Central Venous Line Family Discussion when available I Assessed the need for Mobilization I made an Assessment of medications to be adjusted accordingly Safety Risk assessment completed  CASE DISCUSSED IN MULTIDISCIPLINARY ROUNDS WITH ICU TEAM      Critical care was necessary to treat or prevent imminent or life-threatening deterioration.   PATIENT WITH VERY POOR PROGNOSIS I ANTICIPATE PROLONGED ICU LOS  Patient with Multiorgan failure and at high risk for cardiac arrest and death.   Critical care provider statement:   Total critical care time: 92 minutes   Performed by: Parris MD   Critical care time was exclusive of separately billable procedures and treating other patients.   Critical care was necessary to treat or prevent imminent or life-threatening deterioration.   Critical care was time spent personally by me on  the following activities: development of treatment plan with patient and/or surrogate as well as nursing, discussions with consultants, evaluation of patient's response to treatment, examination of patient, obtaining history from patient or surrogate, ordering and performing treatments and interventions, ordering and review of laboratory studies, ordering and review of radiographic studies, pulse oximetry and re-evaluation of patient's condition.    Japneet Staggs, M.D.  Pulmonary & Critical Care Medicine

## 2023-12-24 NOTE — Progress Notes (Signed)
 At bedside for PIV placement. Pt noted to have very poor vasculature. Vessels over 1.5 cm in depth. 1 vessel identified for cannulation with success. If further access is required, central access is recommended. RN aware.

## 2023-12-24 NOTE — Progress Notes (Signed)
 OT Cancellation Note  Patient Details Name: Gary [name] MRN: [id number] DOB: 07-01-1955   Cancelled Treatment:    Reason Eval/Treat Not Completed: Medical issues which prohibited therapy. Chart reviewed - pt has transitioned to higher level of care. Per therapy protocols, will require new orders to initiate therapy services. Will sign off and await new orders to reevaluate when pt medically appropriate.    Elston Slot, M.S. OTR/L  12/24/23, 4:31 PM  ascom [phone]

## 2023-12-25 DIAGNOSIS — I5032 Chronic diastolic (congestive) heart failure: Secondary | ICD-10-CM

## 2023-12-25 DIAGNOSIS — N1831 Chronic kidney disease, stage 3a: Secondary | ICD-10-CM

## 2023-12-25 DIAGNOSIS — J9621 Acute and chronic respiratory failure with hypoxia: Secondary | ICD-10-CM

## 2023-12-25 DIAGNOSIS — I10 Essential (primary) hypertension: Secondary | ICD-10-CM

## 2023-12-25 LAB — BLOOD GAS, VENOUS
Acid-Base Excess: 8.6 mmol/L — ABNORMAL HIGH (ref 0.0–2.0)
Bicarbonate: 35.7 mmol/L — ABNORMAL HIGH (ref 20.0–28.0)
O2 Saturation: 95.5 %
Patient temperature: 37
pCO2, Ven: 59 mmHg (ref 44–60)
pH, Ven: 7.39 (ref 7.25–7.43)
pO2, Ven: 69 mmHg — ABNORMAL HIGH (ref 32–45)

## 2023-12-25 LAB — BASIC METABOLIC PANEL WITH GFR
Anion gap: 12 (ref 5–15)
BUN: 18 mg/dL (ref 8–23)
CO2: 31 mmol/L (ref 22–32)
Calcium: 7.5 mg/dL — ABNORMAL LOW (ref 8.9–10.3)
Chloride: 103 mmol/L (ref 98–111)
Creatinine, Ser: 1.68 mg/dL — ABNORMAL HIGH (ref 0.61–1.24)
GFR, Estimated: 45 mL/min — ABNORMAL LOW (ref >60)
Glucose, Bld: 83 mg/dL (ref 70–99)
Potassium: 3.1 mmol/L — ABNORMAL LOW (ref 3.5–5.1)
Sodium: 146 mmol/L — ABNORMAL HIGH (ref 135–145)

## 2023-12-25 LAB — CBC WITH DIFFERENTIAL/PLATELET
Abs Immature Granulocytes: 0.01 K/uL (ref 0.00–0.07)
Basophils Absolute: 0 K/uL (ref 0.0–0.1)
Basophils Relative: 0 %
Eosinophils Absolute: 0.1 K/uL (ref 0.0–0.5)
Eosinophils Relative: 4 %
HCT: 29.9 % — ABNORMAL LOW (ref 39.0–52.0)
Hemoglobin: 8.3 g/dL — ABNORMAL LOW (ref 13.0–17.0)
Immature Granulocytes: 0 %
Lymphocytes Relative: 27 %
Lymphs Abs: 1 K/uL (ref 0.7–4.0)
MCH: 24.1 pg — ABNORMAL LOW (ref 26.0–34.0)
MCHC: 27.8 g/dL — ABNORMAL LOW (ref 30.0–36.0)
MCV: 86.9 fL (ref 80.0–100.0)
Monocytes Absolute: 0.4 K/uL (ref 0.1–1.0)
Monocytes Relative: 10 %
Neutro Abs: 2.2 K/uL (ref 1.7–7.7)
Neutrophils Relative %: 59 %
Platelets: 112 K/uL — ABNORMAL LOW (ref 150–400)
RBC: 3.44 MIL/uL — ABNORMAL LOW (ref 4.22–5.81)
RDW: 18.5 % — ABNORMAL HIGH (ref 11.5–15.5)
WBC: 3.8 K/uL — ABNORMAL LOW (ref 4.0–10.5)
nRBC: 0.5 % — ABNORMAL HIGH (ref 0.0–0.2)

## 2023-12-25 LAB — GLUCOSE, CAPILLARY
Glucose-Capillary: 100 mg/dL — ABNORMAL HIGH (ref 70–99)
Glucose-Capillary: 68 mg/dL — ABNORMAL LOW (ref 70–99)
Glucose-Capillary: 74 mg/dL (ref 70–99)
Glucose-Capillary: 78 mg/dL (ref 70–99)
Glucose-Capillary: 82 mg/dL (ref 70–99)

## 2023-12-25 LAB — PHOSPHORUS: Phosphorus: 3.3 mg/dL (ref 2.5–4.6)

## 2023-12-25 LAB — MAGNESIUM: Magnesium: 2 mg/dL (ref 1.7–2.4)

## 2023-12-25 LAB — PROCALCITONIN: Procalcitonin: 0.16 ng/mL

## 2023-12-25 MED ORDER — PANTOPRAZOLE SODIUM 40 MG IV SOLR: 40.0000 mg | Freq: Every day | INTRAVENOUS | Status: AC

## 2023-12-25 MED ORDER — GUAIFENESIN ER 600 MG PO TB12: 600.0000 mg | ORAL_TABLET | Freq: Two times a day (BID) | ORAL | Status: AC

## 2023-12-25 MED ORDER — FENTANYL CITRATE PF 50 MCG/ML IJ SOSY: 50.0000 ug | PREFILLED_SYRINGE | INTRAMUSCULAR | Status: AC | PRN

## 2023-12-25 MED ORDER — HYDROXYZINE HCL 25 MG PO TABS: 25.0000 mg | ORAL_TABLET | Freq: Three times a day (TID) | ORAL | Status: AC

## 2023-12-25 MED ORDER — ESCITALOPRAM OXALATE 20 MG PO TABS: 20.0000 mg | ORAL_TABLET | Freq: Every day | ORAL | Status: AC

## 2023-12-25 MED ORDER — ONDANSETRON HCL 4 MG PO TABS: 4.0000 mg | ORAL_TABLET | Freq: Four times a day (QID) | ORAL | Status: AC | PRN

## 2023-12-25 MED ORDER — ORAL CARE MOUTH RINSE: 15.0000 mL | OROMUCOSAL | Status: AC

## 2023-12-25 MED ORDER — MIDODRINE HCL 2.5 MG PO TABS: 2.5000 mg | ORAL_TABLET | Freq: Two times a day (BID) | ORAL | Status: AC

## 2023-12-25 MED ORDER — ORAL CARE MOUTH RINSE: 15.0000 mL | OROMUCOSAL | Status: AC | PRN

## 2023-12-25 MED ORDER — ACETAMINOPHEN 325 MG PO TABS: 650.0000 mg | ORAL_TABLET | Freq: Four times a day (QID) | ORAL | Status: AC | PRN

## 2023-12-25 MED ORDER — CHLORHEXIDINE GLUCONATE CLOTH 2 % EX PADS: 6.0000 | MEDICATED_PAD | Freq: Every day | CUTANEOUS | Status: AC

## 2023-12-25 MED ORDER — FE FUM-VIT C-VIT B12-FA 460-60-0.01-1 MG PO CAPS: 1.0000 | ORAL_CAPSULE | Freq: Two times a day (BID) | ORAL | Status: AC

## 2023-12-25 MED ORDER — POTASSIUM CHLORIDE 10 MEQ/100ML IV SOLN
10.0000 meq | INTRAVENOUS | Status: AC
Start: 2023-12-25 — End: 2023-12-25
  Administered 2023-12-25 (×4): 10 meq via INTRAVENOUS
  Filled 2023-12-25 (×4): qty 100

## 2023-12-25 MED ORDER — FLUTICASONE FUROATE-VILANTEROL 100-25 MCG/ACT IN AEPB: 1.0000 | INHALATION_SPRAY | Freq: Every day | RESPIRATORY_TRACT | Status: AC

## 2023-12-25 MED ORDER — DEXTROSE 50 % IV SOLN: 12.5000 g | INTRAVENOUS | Status: AC

## 2023-12-25 MED ORDER — MIDAZOLAM HCL 2 MG/2ML IJ SOLN: 2.0000 mg | INTRAMUSCULAR | Status: AC | PRN

## 2023-12-25 MED ORDER — DEXTROSE 50 % IV SOLN
INTRAVENOUS | Status: AC
  Administered 2023-12-25: 12.5 g via INTRAVENOUS
  Filled 2023-12-25: qty 50

## 2023-12-25 MED ORDER — POTASSIUM CHLORIDE 10 MEQ/100ML IV SOLN: 10.0000 meq | INTRAVENOUS | Status: AC

## 2023-12-25 MED ORDER — IPRATROPIUM-ALBUTEROL 0.5-2.5 (3) MG/3ML IN SOLN: 3.0000 mL | Freq: Four times a day (QID) | RESPIRATORY_TRACT | Status: AC | PRN

## 2023-12-25 MED ORDER — IPRATROPIUM-ALBUTEROL 0.5-2.5 (3) MG/3ML IN SOLN: 3.0000 mL | Freq: Four times a day (QID) | RESPIRATORY_TRACT | Status: AC

## 2023-12-25 NOTE — Progress Notes (Signed)
 NAME:  Gary [name], MRN:  [id number], DOB:  1956/03/30, LOS: 27 ADMISSION DATE:  11/28/2023  CHIEF COMPLAINT:  resp failure  History of Present Illness:   68 y.o. male with medical history significant of heart failure preserved ejection fraction, hx of  DVT s/p treatment , AAA, COPD, hypertension, anemia, chronic hypoxic and hypercapnic respiratory failure on chronic trach (Shiley XLT #7 un-cuffed) on [place] 5-6L during the day and bipap at bedtime. Patient  presents to ED  from  rehab facility in [place]. EMS was called due sat in 70's per notes patient uses Bipap but facility does not provide this therapy.    Patient was found to be with acute on chronic hypoxic and hypercapnic respiratory failure and was placed on BiPAP.  Labs with worsening anemia with hemoglobin at 7.6.  Received IV Lasix , he also received IV iron .     He had multiple maroon colored Bms with clot mixed in.  He received 1 units of PRBC, seen by GI, not recommended any additional workup.   Patient has restarted IV Lasix  since 9/5 due to volume overload.  He developed worsening respite failure with an responsiveness.  ABG showed severe CO2 retention, patient  transferred to ICU 9/7.  Significant Hospital Events: Including procedures, antibiotic start and stop dates in addition to other pertinent events   9/7 admitted to ICU for worsening resp failure Unable to find cuffed trach, awaiting tubes to arrive from [facility], will proceed with oral intubation if needed  12/23/23- patient on PRVC with fio2 50% and moderate resp secretions, plan to perform SBT and wean from MV.  He does have GI bleed and were monitoring H/h.  His trache has been replaced to cuffed from cuffless.  He responds to voice.  He is with pulmonary edema and multifocal infiltrates.  12/24/23- patient on 12/5 SBT at this moment.  He is midly improved but oliguric.  We failed SBT attempt today due to hypoxemia.  12/25/23- patient has been approved to [facility] and is not  passing his SBT yet.  He has failed SBT 3 times already.  He has improved but may need LTACH for weaning to trache collar from mechanical ventilation.  He is chronically ill but improving. He was able to communicate appropriately via gesturing and nodding.  He awake and calm in no distress.  I have called today for Peer to Peer review with insurance for approval.   Interim History / Subjective:  Critically ill Prognosis is poor Airway compromised    Objective   Blood pressure (!) 100/56, pulse 78, temperature 98.1 F (36.7 C), temperature source Oral, resp. rate 20, height 6' 4 (1.93 m), weight (!) 166.4 kg, SpO2 93%.    Vent Mode: PRVC FiO2 (%):  [40 %] 40 % Set Rate:  [16 bmp] 16 bmp Vt Set:  [500 mL] 500 mL PEEP:  [5 cmH20] 5 cmH20 Pressure Support:  [10 cmH20-17 cmH20] 17 cmH20   Intake/Output Summary (Last 24 hours) at 12/25/2023 0801 Last data filed at 12/25/2023 0000 Gross per 24 hour  Intake 949.12 ml  Output 1900 ml  Net -950.88 ml   Filed Weights   12/13/23 0900 12/25/23 0419 12/25/23 0420  Weight: (!) 197.5 kg (!) 166.4 kg (!) 166.4 kg    REVIEW OF SYSTEMS  PATIENT IS UNABLE TO PROVIDE COMPLETE REVIEW OF SYSTEMS DUE mechanical ventilation via tracheostomy  PHYSICAL EXAMINATION:  GENERAL:critically ill appearing, +resp distress EYES: Pupils equal, round, reactive to light.  No scleral icterus.  MOUTH: Moist mucosal membrane. Trach on mechanical ventilation NECK: Supple.  PULMONARY:  +rhonchi CARDIOVASCULAR: S1 and S2.  Regular rate and rhythm GASTROINTESTINAL: Soft, nontender, -distended. Positive bowel sounds.  MUSCULOSKELETAL: No swelling, clubbing, or edema.  NEUROLOGIC: obtunded, SKIN:normal, warm to touch, Capillary refill delayed  Pulses present bilaterally   ASSESSMENT AND PLAN SYNOPSIS  68 yo morbidly obese AAM Severe ACUTE Hypoxic and Hypercapnic Respiratory Failure due to severe end stage sCHF complicated by GIB  Severe ACUTE Hypoxic and  Hypercapnic Respiratory Failure Plan for trach change versus oral intubation Vent Mode: PRVC FiO2 (%):  [40 %] 40 % Set Rate:  [16 bmp] 16 bmp Vt Set:  [500 mL] 500 mL PEEP:  [5 cmH20] 5 cmH20 Pressure Support:  [10 cmH20-17 cmH20] 17 cmH20   -plant to wean from ventilatory today  CARDIAC FAILURE-acute combined systolic/diastolic dysfunction -oxygen  as needed -Lasix  as tolerated    CARDIAC ICU monitoring   Chronic renal failure stage 3A     - diureseis -continue Foley Catheter-assess need -Avoid nephrotoxic agents -Follow urine output, BMP -Ensure adequate renal perfusion, optimize oxygenation -Renal dose medications   Intake/Output Summary (Last 24 hours) at 12/25/2023 0801 Last data filed at 12/25/2023 0000 Gross per 24 hour  Intake 949.12 ml  Output 1900 ml  Net -950.88 ml      Latest Ref Rng & Units 12/25/2023    5:03 AM 12/24/2023    4:31 PM 12/24/2023    5:43 AM  BMP  Glucose 70 - 99 mg/dL 83   79   BUN 8 - 23 mg/dL 18   21   Creatinine 9.38 - 1.24 mg/dL 8.31   8.53   Sodium 864 - 145 mmol/L 146   142   Potassium 3.5 - 5.1 mmol/L 3.1  3.6  2.9   Chloride 98 - 111 mmol/L 103   97   CO2 22 - 32 mmol/L 31   35   Calcium  8.9 - 10.3 mg/dL 7.5   7.7       NEUROLOGY Acute  metabolic encephalopathy Vent support needed, due to hypoxia and hypercapnia  ENDO - ICU hypoglycemic\Hyperglycemia protocol -check FSBS per protocol   GI GI PROPHYLAXIS as indicated  NUTRITIONAL STATUS DIET-->NPO Constipation protocol as indicated   ELECTROLYTES -follow labs as needed -replace as needed -pharmacy consultation and following     Best practice (right click and Reselect all SmartList Selections daily)  Diet:  NPO Code Status:  FULL CODE Disposition: ICU  Labs   CBC: Recent Labs  Lab 12/21/23 0551 12/23/23 0441 12/23/23 2329 12/24/23 0543 12/25/23 0503  WBC  --  5.3  --  4.0 3.8*  NEUTROABS  --   --   --   --  2.2  HGB 8.6* 8.5* 8.5* 8.8* 8.3*   HCT  --  32.7* 30.2* 31.3* 29.9*  MCV  --  92.1  --  87.9 86.9  PLT  --  94*  --  114* 112*    Basic Metabolic Panel: Recent Labs  Lab 12/21/23 0551 12/22/23 0443 12/23/23 0441 12/24/23 0543 12/24/23 1631 12/25/23 0503  NA 142 144 145 142  --  146*  K 3.5 3.9 3.7 2.9* 3.6 3.1*  CL 97* 97* 98 97*  --  103  CO2 38* 40* 36* 35*  --  31  GLUCOSE 84 97 73 79  --  83  BUN 18 17 20 21   --  18  CREATININE 1.36* 1.55* 1.47* 1.46*  --  1.68*  CALCIUM  7.6* 7.7* 7.7* 7.7*  --  7.5*  MG 1.9 1.9 1.7 2.3  --  2.0  PHOS  --   --   --  2.4*  --  3.3   GFR: Estimated Creatinine Clearance: 72.6 mL/min (A) (by C-G formula based on SCr of 1.68 mg/dL (H)). Recent Labs  Lab 12/19/23 0636 12/23/23 0441 12/23/23 0708 12/24/23 0543 12/25/23 0503  PROCALCITON <0.10  --  0.11 0.13 0.16  WBC  --  5.3  --  4.0 3.8*    Liver Function Tests: No results for input(s): AST, ALT, ALKPHOS, BILITOT, PROT, ALBUMIN  in the last 168 hours. No results for input(s): LIPASE, AMYLASE in the last 168 hours. No results for input(s): AMMONIA in the last 168 hours.  ABG    Component Value Date/Time   PHART 7.46 (H) 12/23/2023 0628   PCO2ART 60 (H) 12/23/2023 0628   PO2ART 57 (L) 12/23/2023 0628   HCO3 42.7 (H) 12/23/2023 0628   TCO2 37 (H) 10/17/2023 0823   ACIDBASEDEF 0.1 10/03/2021 0927   O2SAT 93.8 12/23/2023 0628     Coagulation Profile: No results for input(s): INR, PROTIME in the last 168 hours.  Cardiac Enzymes: No results for input(s): CKTOTAL, CKMB, CKMBINDEX, TROPONINI in the last 168 hours.  HbA1C: Hgb A1c MFr Bld  Date/Time Value Ref Range Status  10/16/2023 01:20 AM 5.0 4.8 - 5.6 % Final    Comment:    (NOTE) Diagnosis of Diabetes The following HbA1c ranges recommended by the American Diabetes Association (ADA) may be used as an aid in the diagnosis of diabetes mellitus.  Hemoglobin             Suggested A1C NGSP%              Diagnosis  <5.7                    Non Diabetic  5.7-6.4                Pre-Diabetic  >6.4                   Diabetic  <7.0                   Glycemic control for                       adults with diabetes.    09/27/2022 05:35 AM 4.6 (L) 4.8 - 5.6 % Final    Comment:    (NOTE) Pre diabetes:          5.7%-6.4%  Diabetes:              >6.4%  Glycemic control for   <7.0% adults with diabetes     CBG: Recent Labs  Lab 12/24/23 1934 12/24/23 2033 12/25/23 0010 12/25/23 0038 12/25/23 0415  GLUCAP 72 75 68* 100* 78    Allergies No Known Allergies     DVT/GI PRX  assessed I Assessed the need for Labs I Assessed the need for Foley I Assessed the need for Central Venous Line Family Discussion when available I Assessed the need for Mobilization I made an Assessment of medications to be adjusted accordingly Safety Risk assessment completed  CASE DISCUSSED IN MULTIDISCIPLINARY ROUNDS WITH ICU TEAM      Critical care was necessary to treat or prevent imminent or life-threatening deterioration.   PATIENT WITH VERY POOR PROGNOSIS I ANTICIPATE PROLONGED ICU LOS  Patient with  Multiorgan failure and at high risk for cardiac arrest and death.   Critical care provider statement:   Total critical care time: 92 minutes   Performed by: Parris MD   Critical care time was exclusive of separately billable procedures and treating other patients.   Critical care was necessary to treat or prevent imminent or life-threatening deterioration.   Critical care was time spent personally by me on the following activities: development of treatment plan with patient and/or surrogate as well as nursing, discussions with consultants, evaluation of patient's response to treatment, examination of patient, obtaining history from patient or surrogate, ordering and performing treatments and interventions, ordering and review of laboratory studies, ordering and review of radiographic studies, pulse oximetry and  re-evaluation of patient's condition.    [name], M.D.  Pulmonary & Critical Care Medicine

## 2023-12-25 NOTE — TOC Progression Note (Signed)
 Transition of Care [facility]) - Progression Note    Patient Details  Name: Gary [name] MRN: [id number] Date of Birth: [birth year]  Transition of Care [facility]) CM/SW Contact  K'La JINNY Ruts, LCSW Phone Number: 12/25/2023, 1:08 PM  Clinical Narrative:    Chart reviewed. I spoke with the patient sister, Gary [name]. I introduced myself, my role, and reason for call. I informed the patient sister that the patient will be discharging today to [facility]. The patient sister verbally agreed. I informed the patient sister that [facility] will be following up with her about patient care.    Expected Discharge Plan: Skilled Nursing Facility Barriers to Discharge: No SNF bed               Expected Discharge Plan and Services     Post Acute Care Choice: Skilled Nursing Facility Living arrangements for the past 2 months: Skilled Nursing Facility [facility]) Expected Discharge Date: 12/25/23                                     Social Drivers of Health (SDOH) Interventions SDOH Screenings   Food Insecurity: No Food Insecurity (11/30/2023)  Housing: High Risk (11/30/2023)  Transportation Needs: No Transportation Needs (11/30/2023)  Utilities: Not At Risk (11/30/2023)  Depression (PHQ2-9): Low Risk  (06/07/2021)  Financial Resource Strain: High Risk (11/21/2022)   [facility] from [facility]  Physical Activity: Insufficiently Active (05/03/2017)  Social Connections: Moderately Isolated (11/30/2023)  Stress: Stress Concern Present (11/20/2022)   Received from [facility]  Tobacco Use: Medium Risk (11/30/2023)    Readmission Risk Interventions    03/19/2023    9:58 AM 11/05/2022    2:59 PM 11/05/2022   12:16 PM  Readmission Risk Prevention Plan  Transportation Screening Complete Complete Complete  Medication Review Oceanographer) Complete Complete Complete  PCP or Specialist appointment within 3-5 days of discharge Complete Complete Complete  HRI or Home Care Consult Complete Complete  Complete  SW Recovery Care/Counseling Consult Complete Complete Complete  Palliative Care Screening Complete Complete Complete  Skilled Nursing Facility Not Applicable Not Applicable Not Applicable

## 2023-12-25 NOTE — Discharge Summary (Signed)
 Physician Discharge Summary  Patient ID: Gary [name] MRN: [id number] DOB/AGE: Sep 27, 1955 68 y.o.  Admit date: 11/28/2023 Discharge date: 12/25/2023   Brief Pt Description / Synopsis:  68 yo morbidly obese male with PMHx significant for morbid obesity, OHS/OSA, COPD, HFpEF, and chronic tracheostomy who is admitted with Acute on Chronic Hypoxic and Hypercapnic Respiratory Failure due to Acute COPD Exacerbation and Acute Decompensated HFpEF requiring Tracheostomy exchange and mechanical ventilation.  Course complicated by Acute BI Bleed.   Discharge Diagnoses:   Acute on Chronic Hypoxic & Hypercapnic Respiratory Failure  Acute COPD Exacerbation  Acute Decompensated HFmrEF OSA/OHS Acute Blood Loss Anemia on Iron  Deficiency Anemia  Acute GI Bleed ~ felt to be due to diverticulosis  CKD Stage IIIa Acute Metabolic Encephalopathy Morbid Obesity                              Discharge Summary:  68 y.o. male with medical history significant of heart failure preserved ejection fraction, hx of  DVT s/p treatment , AAA, COPD, hypertension, anemia, chronic hypoxic and hypercapnic respiratory failure on chronic trach ([place]iley XLT #7 un-cuffed) on [place] 5-6L during the day and bipap at bedtime. Patient  presents to ED  from  rehab facility in [name]. EMS was called due sat in 70's per notes patient uses Bipap but facility does not provide this therapy.    Patient was found to be with acute on chronic hypoxic and hypercapnic respiratory failure and was placed on BiPAP.  Labs with worsening anemia with hemoglobin at 7.6.  Received IV Lasix , he also received IV iron .     He had multiple maroon colored Bms with clot mixed in.  He received 1 units of PRBC, seen by GI, not recommended any additional workup.   Patient has restarted IV Lasix  since 9/5 due to volume overload.  He developed worsening respite failure with an responsiveness.  ABG showed severe CO2 retention, patient  transferred to ICU  9/7.  Significant Events:  9/7 admitted to ICU for worsening resp failure Unable to find cuffed trach, awaiting tubes to arrive from [facility], will proceed with oral intubation if needed 12/23/23- patient on PRVC with fio2 50% and moderate resp secretions, plan to perform SBT and wean from MV.  He does have GI bleed and were monitoring H/h.  His trache has been replaced to cuffed from cuffless.  He responds to voice.  He is with pulmonary edema and multifocal infiltrates.  12/24/23- patient on 12/5 SBT at this moment.  He is midly improved but oliguric.  We failed SBT attempt today due to hypoxemia.  12/25/23- patient has been approved to [facility] and is not passing his SBT yet.  He has failed SBT 3 times already.  He has improved but may need LTACH for weaning to trache collar from mechanical ventilation.  He is chronically ill but improving. He was able to communicate appropriately via gesturing and nodding.  He awake and calm in no distress.  I have called today for Peer to Peer review with insurance for approval. Plan to discharge to [facility].  Discharge Plan by Diagnosis:   #Acute on Chronic Hypoxic & Hypercapnic Respiratory Failure due to ... #Acute COPD Exacerbation  #Acute Decompensated HFpEF PMHx: Chronic respiratory failure requiring Tracheostomy, OHS/OSA -Full vent support, implement lung protective strategies -Plateau pressures less than 30 cm H20 -Wean FiO2 & PEEP as tolerated to maintain O2 sats 88 to 92% -Follow intermittent Chest  X-ray & ABG as needed -Spontaneous Breathing Trials when respiratory parameters met and mental status permits -Implement VAP Bundle -Bronchodilators -Diuresis as BP and renal function permits  #Acute Decompensated HFmrEF -Echocardiogram 10/17/23: LVEF 40-45%, indeterminate diastolic parameters, RV systolic function normal, RV size moderately enlarged -Continuous cardiac monitoring -Maintain MAP >65 -Vasopressors as needed to maintain MAP goal ~ not  requiring -Continue Midodrine   -Diuresis as BP and renal function permits  #Acute Blood Loss Anemia due to... #Acute GI Bleed, likely due to diverticulosis  ~ RESOLVED  PMHx: Iron  deficiency anemia, colitis  -Tagged RBC scan negative for active GI Bleed on both 12/06/23 and 12/10/23 -CT Angio GI Bleed negative for acute GI Bleed, did show wall thickening and inflammation of the proximal and mid sigmoid colon concerning for colitis, also with Cholelithiasis but no evidence of acute cholecystitis  -Underwent Sigmoidoscopy on 8/23: blood found in colon, but no active bleeding, felt likely to be due to diverticulosis, no active colitis  -Monitor for S/Sx of bleeding -Trend CBC -SCD's for VTE Prophylaxis  -Transfuse for Hgb <7  #CKD Stage IIIa -Monitor I&O's / urinary output -Follow BMP -Ensure adequate renal perfusion -Avoid nephrotoxic agents as able -Replace electrolytes as indicated ~ Pharmacy following for assistance with electrolyte replacement  #Acute Metabolic Encephalopathy PMHx: Anxiety  -Treatment of metabolic derangements as outlined above -Provide supportive care -Promote normal sleep/wake cycle and family presence -Avoid sedating medications as able -Continue Escitalopram         Significant Diagnostic Studies:  8/14: CT Chest wo Contrast>>IMPRESSION: Progressive atelectatic changes in the right upper lobe when compared with previous exams. No other focal abnormality is noted. 8/21: CT angio GI Bleed>>IMPRESSION: VASCULAR No evidence of active contrast extravasation. 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 Recommend follow-up ultrasound every 3 years. (Ref.: J Vasc Surg. 2018; 67:2-77 and J Am Coll Radiol 2013;10(10):789-794.) NON-VASCULAR Wall thickening and surrounding inflammation involving the proximal and mid sigmoid colon most likely colitis and likely the source of GI bleed. However, malignancy could have a similar  appearance. Recommend GI consultation and follow-up after treatment and acute symptoms resolve to exclude malignancy. Cholelithiasis.  No CT evidence of acute cholecystitis. Dependent atelectasis in the lower lobes. Colonic diverticulosis. 8/22: Tagged RBC scan>> IMPRESSION: Negative.  No evidence of active GI bleeding. 8/26: Repeat Tagged RBC Scan>> IMPRESSION: 1. No active gastrointestinal bleeding is detected over the two hour observation.         Micro Data:  8/14: RVP>> negative  9/7: MRSA PCR>>negative 9/8: Tracheal aspirate>> gram negative rods, gram + cocci  Antimicrobials:   Anti-infectives (From admission, onward)    Start     Dose/Rate Route Frequency Ordered Stop   11/30/23 2000  levofloxacin  (LEVAQUIN ) tablet 750 mg        750 mg Oral Daily 11/30/23 1309 12/02/23 2054   11/28/23 2200  levofloxacin  (LEVAQUIN ) IVPB 750 mg  Status:  Discontinued        750 mg 100 mL/hr over 90 Minutes Intravenous Every 24 hours 11/28/23 2124 11/30/23 1309        Consults:  PCCM Gastroenterology  Physical Therapy / Occupational Therapy   Discharge Exam:   General: Acute on chronically ill appearing male, laying in bed, on vent, in NAD Neuro: Sleeping, arouses easily to voice, moves all extremities to commands, no focal deficits CV: Regular rate and rhythm, s1s2, no M/R/G PULM: Coarse breath sounds throughout, vent assisted, even, nonlabored, synchronous with the vent GI: Obese, soft, nontender, nondistended, no  guarding or rebound tenderness, BS+ x4 Extremities: Normal bulk and tone, no deformities, trace edema bilateral LE  Vitals:   12/25/23 0500 12/25/23 0530 12/25/23 0600 12/25/23 0630  BP: (!) 98/55 (!) 97/57 (!) 102/54 (!) 100/56  Pulse: 81 81 79 78  Resp: (!) 25 20 (!) 23 20  Temp:      TempSrc:      SpO2: 95% 93% 92% 93%  Weight:      Height:         Discharge Labs:   BMET Recent Labs  Lab 12/21/23 0551 12/22/23 0443 12/23/23 0441 12/24/23 0543  12/24/23 1631 12/25/23 0503  NA 142 144 145 142  --  146*  K 3.5 3.9 3.7 2.9* 3.6 3.1*  CL 97* 97* 98 97*  --  103  CO2 38* 40* 36* 35*  --  31  GLUCOSE 84 97 73 79  --  83  BUN 18 17 20 21   --  18  CREATININE 1.36* 1.55* 1.47* 1.46*  --  1.68*  CALCIUM  7.6* 7.7* 7.7* 7.7*  --  7.5*  MG 1.9 1.9 1.7 2.3  --  2.0  PHOS  --   --   --  2.4*  --  3.3    CBC Recent Labs  Lab 12/23/23 0441 12/23/23 2329 12/24/23 0543 12/25/23 0503  HGB 8.5* 8.5* 8.8* 8.3*  HCT 32.7* 30.2* 31.3* 29.9*  WBC 5.3  --  4.0 3.8*  PLT 94*  --  114* 112*    Anti-Coagulation No results for input(s): INR in the last 168 hours.      Contact information for after-discharge care     Destination     [facility] .   Service: Skilled Nursing Contact information: [address] [place]   [address] [phone]                      Allergies as of 12/25/2023   No Known Allergies      Medication List     STOP taking these medications    albuterol  (2.5 MG/3ML) 0.083% nebulizer solution Commonly known as: PROVENTIL    amphetamine -dextroamphetamine  30 MG tablet Commonly known as: ADDERALL   artificial tears ophthalmic solution   budesonide  0.5 MG/2ML nebulizer solution Commonly known as: PULMICORT    Clotrimazole  Anti-Fungal 1 % cream Generic drug: clotrimazole    Dulera  100-5 MCG/ACT Aero Generic drug: mometasone -formoterol  Replaced by: fluticasone  furoate-vilanterol 100-25 MCG/ACT Aepb   enoxaparin  100 MG/ML injection Commonly known as: LOVENOX    mesalamine  1.2 g EC tablet Commonly known as: LIALDA    oxyCODONE  5 MG immediate release tablet Commonly known as: Oxy IR/ROXICODONE    pantoprazole  40 MG tablet Commonly known as: PROTONIX  Replaced by: pantoprazole  40 MG injection   pregabalin  50 MG capsule Commonly known as: LYRICA    senna-docusate 8.6-50 MG tablet Commonly known as: Senokot-S   spironolactone  25 MG tablet Commonly known as:  ALDACTONE    torsemide  20 MG tablet Commonly known as: DEMADEX    traZODone  50 MG tablet Commonly known as: DESYREL    Zepbound 2.5 MG/0.5ML injection vial Generic drug: tirzepatide       TAKE these medications    acetaminophen  325 MG tablet Commonly known as: TYLENOL  Take 2 tablets (650 mg total) by mouth every 6 (six) hours as needed for mild pain (pain score 1-3) or fever (or Fever >/= 101).   Chlorhexidine  Gluconate Cloth 2 % Pads Apply 6 each topically daily.   escitalopram  20 MG tablet Commonly known as: LEXAPRO   Take 1 tablet (20 mg total) by mouth daily. What changed:  additional instructions Another medication with the same name was removed. Continue taking this medication, and follow the directions you see here.   Fe Fum-Vit C-Vit B12-FA Caps capsule Commonly known as: TRIGELS-F FORTE Take 1 capsule by mouth 2 (two) times daily.   fentaNYL  50 MCG/ML injection Commonly known as: SUBLIMAZE  Inject 1-4 mLs (50-200 mcg total) into the vein every 30 (thirty) minutes as needed (to maintain RASS & CPOT goal.).   fluticasone  furoate-vilanterol 100-25 MCG/ACT Aepb Commonly known as: BREO ELLIPTA  Inhale 1 puff into the lungs daily. Replaces: Dulera  100-5 MCG/ACT Aero   guaiFENesin  600 MG 12 hr tablet Commonly known as: MUCINEX  Take 1 tablet (600 mg total) by mouth 2 (two) times daily.   hydrOXYzine  25 MG tablet Commonly known as: ATARAX  Take 1 tablet (25 mg total) by mouth 3 (three) times daily. What changed:  when to take this reasons to take this   ipratropium-albuterol  0.5-2.5 (3) MG/3ML Soln Commonly known as: DUONEB Take 3 mLs by nebulization every 6 (six) hours. What changed:  when to take this reasons to take this   ipratropium-albuterol  0.5-2.5 (3) MG/3ML Soln Commonly known as: DUONEB Take 3 mLs by nebulization every 6 (six) hours as needed. What changed: You were already taking a medication with the same name, and this prescription was added. Make  sure you understand how and when to take each.   midazolam  2 MG/2ML Soln injection Commonly known as: VERSED  Inject 2-4 mLs (2-4 mg total) into the vein every hour as needed for sedation (to maintain RASS goal.).   midodrine  2.5 MG tablet Commonly known as: PROAMATINE  Take 1 tablet (2.5 mg total) by mouth 2 (two) times daily with a meal.   mouth rinse Liqd solution 15 mLs by Mouth Rinse route every 2 (two) hours.   mouth rinse Liqd solution 15 mLs by Mouth Rinse route as needed (oral care).   ondansetron  4 MG tablet Commonly known as: ZOFRAN  Take 1 tablet (4 mg total) by mouth every 6 (six) hours as needed for nausea.   pantoprazole  40 MG injection Commonly known as: PROTONIX  Inject 40 mg into the vein daily. Replaces: pantoprazole  40 MG tablet   potassium chloride  10 MEQ/100ML Inject 100 mLs (10 mEq total) into the vein every 1 hour x 4 doses.            Disposition: [facility]  Discharged Condition: TORRE SCHAUMBURG has met maximum benefit of inpatient care and is medically stable and cleared for discharge.  Patient is pending follow up as above.      Time spent on disposition:  45 Minutes.     Signed: Inge Lecher, AGACNP-BC [facility] Prefer epic messenger for cross cover needs If after hours, please call E-link

## 2023-12-25 NOTE — Plan of Care (Signed)
  Problem: Education: Goal: Knowledge of General Education information will improve Description: Including pain rating scale, medication(s)/side effects and non-pharmacologic comfort measures Outcome: Progressing   Problem: Health Behavior/Discharge Planning: Goal: Ability to manage health-related needs will improve Outcome: Progressing   Problem: Clinical Measurements: Goal: Ability to maintain clinical measurements within normal limits will improve Outcome: Progressing Goal: Will remain free from infection Outcome: Progressing Goal: Diagnostic test results will improve Outcome: Progressing Goal: Respiratory complications will improve Outcome: Progressing Goal: Cardiovascular complication will be avoided Outcome: Progressing   Problem: Activity: Goal: Risk for activity intolerance will decrease Outcome: Progressing   Problem: Nutrition: Goal: Adequate nutrition will be maintained Outcome: Not Progressing

## 2023-12-26 LAB — CULTURE, RESPIRATORY W GRAM STAIN

## 2023-12-28 DIAGNOSIS — Z930 Tracheostomy status: Secondary | ICD-10-CM

## 2023-12-28 DIAGNOSIS — N179 Acute kidney failure, unspecified: Secondary | ICD-10-CM

## 2023-12-28 DIAGNOSIS — J449 Chronic obstructive pulmonary disease, unspecified: Secondary | ICD-10-CM

## 2023-12-28 DIAGNOSIS — N1831 Chronic kidney disease, stage 3a: Secondary | ICD-10-CM

## 2023-12-28 DIAGNOSIS — J9621 Acute and chronic respiratory failure with hypoxia: Secondary | ICD-10-CM

## 2023-12-28 DIAGNOSIS — I10 Essential (primary) hypertension: Secondary | ICD-10-CM

## 2023-12-28 DIAGNOSIS — I5032 Chronic diastolic (congestive) heart failure: Secondary | ICD-10-CM

## 2023-12-30 DIAGNOSIS — J9621 Acute and chronic respiratory failure with hypoxia: Secondary | ICD-10-CM

## 2023-12-30 DIAGNOSIS — N1831 Chronic kidney disease, stage 3a: Secondary | ICD-10-CM

## 2023-12-30 DIAGNOSIS — I5032 Chronic diastolic (congestive) heart failure: Secondary | ICD-10-CM

## 2023-12-30 DIAGNOSIS — I10 Essential (primary) hypertension: Secondary | ICD-10-CM

## 2024-01-06 DIAGNOSIS — I10 Essential (primary) hypertension: Secondary | ICD-10-CM

## 2024-01-06 DIAGNOSIS — I5032 Chronic diastolic (congestive) heart failure: Secondary | ICD-10-CM

## 2024-01-06 DIAGNOSIS — N1831 Chronic kidney disease, stage 3a: Secondary | ICD-10-CM

## 2024-01-06 DIAGNOSIS — J9621 Acute and chronic respiratory failure with hypoxia: Secondary | ICD-10-CM

## 2024-01-13 DIAGNOSIS — N1831 Chronic kidney disease, stage 3a: Secondary | ICD-10-CM

## 2024-01-13 DIAGNOSIS — J449 Chronic obstructive pulmonary disease, unspecified: Secondary | ICD-10-CM

## 2024-01-13 DIAGNOSIS — N179 Acute kidney failure, unspecified: Secondary | ICD-10-CM

## 2024-01-13 DIAGNOSIS — Z930 Tracheostomy status: Secondary | ICD-10-CM

## 2024-01-13 DIAGNOSIS — J9621 Acute and chronic respiratory failure with hypoxia: Secondary | ICD-10-CM

## 2024-01-13 DIAGNOSIS — I5032 Chronic diastolic (congestive) heart failure: Secondary | ICD-10-CM

## 2024-01-13 DIAGNOSIS — I10 Essential (primary) hypertension: Secondary | ICD-10-CM

## 2024-01-14 DIAGNOSIS — J9621 Acute and chronic respiratory failure with hypoxia: Secondary | ICD-10-CM

## 2024-01-14 DIAGNOSIS — Z930 Tracheostomy status: Secondary | ICD-10-CM

## 2024-01-14 DIAGNOSIS — J449 Chronic obstructive pulmonary disease, unspecified: Secondary | ICD-10-CM

## 2024-01-14 DIAGNOSIS — N179 Acute kidney failure, unspecified: Secondary | ICD-10-CM

## 2024-01-15 DIAGNOSIS — J9621 Acute and chronic respiratory failure with hypoxia: Secondary | ICD-10-CM

## 2024-01-15 DIAGNOSIS — Z930 Tracheostomy status: Secondary | ICD-10-CM

## 2024-01-15 DIAGNOSIS — J449 Chronic obstructive pulmonary disease, unspecified: Secondary | ICD-10-CM

## 2024-01-15 DIAGNOSIS — N179 Acute kidney failure, unspecified: Secondary | ICD-10-CM

## 2024-01-16 DIAGNOSIS — J9621 Acute and chronic respiratory failure with hypoxia: Secondary | ICD-10-CM

## 2024-01-16 DIAGNOSIS — Z930 Tracheostomy status: Secondary | ICD-10-CM

## 2024-01-16 DIAGNOSIS — N179 Acute kidney failure, unspecified: Secondary | ICD-10-CM

## 2024-01-16 DIAGNOSIS — J449 Chronic obstructive pulmonary disease, unspecified: Secondary | ICD-10-CM

## 2024-01-17 DIAGNOSIS — N179 Acute kidney failure, unspecified: Secondary | ICD-10-CM

## 2024-01-17 DIAGNOSIS — Z930 Tracheostomy status: Secondary | ICD-10-CM

## 2024-01-17 DIAGNOSIS — J449 Chronic obstructive pulmonary disease, unspecified: Secondary | ICD-10-CM

## 2024-01-17 DIAGNOSIS — J9621 Acute and chronic respiratory failure with hypoxia: Secondary | ICD-10-CM

## 2024-01-18 DIAGNOSIS — N179 Acute kidney failure, unspecified: Secondary | ICD-10-CM

## 2024-01-18 DIAGNOSIS — J9621 Acute and chronic respiratory failure with hypoxia: Secondary | ICD-10-CM

## 2024-01-18 DIAGNOSIS — J449 Chronic obstructive pulmonary disease, unspecified: Secondary | ICD-10-CM

## 2024-01-18 DIAGNOSIS — Z930 Tracheostomy status: Secondary | ICD-10-CM

## 2024-01-19 DIAGNOSIS — N179 Acute kidney failure, unspecified: Secondary | ICD-10-CM

## 2024-01-19 DIAGNOSIS — J9621 Acute and chronic respiratory failure with hypoxia: Secondary | ICD-10-CM

## 2024-01-19 DIAGNOSIS — J449 Chronic obstructive pulmonary disease, unspecified: Secondary | ICD-10-CM

## 2024-01-19 DIAGNOSIS — Z930 Tracheostomy status: Secondary | ICD-10-CM

## 2024-01-20 DIAGNOSIS — J9621 Acute and chronic respiratory failure with hypoxia: Secondary | ICD-10-CM

## 2024-01-20 DIAGNOSIS — I10 Essential (primary) hypertension: Secondary | ICD-10-CM

## 2024-01-20 DIAGNOSIS — N1831 Chronic kidney disease, stage 3a: Secondary | ICD-10-CM

## 2024-01-20 DIAGNOSIS — I5032 Chronic diastolic (congestive) heart failure: Secondary | ICD-10-CM

## 2024-01-25 DIAGNOSIS — N1831 Chronic kidney disease, stage 3a: Secondary | ICD-10-CM

## 2024-01-25 DIAGNOSIS — I10 Essential (primary) hypertension: Secondary | ICD-10-CM

## 2024-01-25 DIAGNOSIS — I5032 Chronic diastolic (congestive) heart failure: Secondary | ICD-10-CM

## 2024-01-25 DIAGNOSIS — J9621 Acute and chronic respiratory failure with hypoxia: Secondary | ICD-10-CM

## 2024-01-27 DIAGNOSIS — N179 Acute kidney failure, unspecified: Secondary | ICD-10-CM

## 2024-01-27 DIAGNOSIS — J449 Chronic obstructive pulmonary disease, unspecified: Secondary | ICD-10-CM

## 2024-01-27 DIAGNOSIS — I10 Essential (primary) hypertension: Secondary | ICD-10-CM

## 2024-01-27 DIAGNOSIS — Z930 Tracheostomy status: Secondary | ICD-10-CM

## 2024-01-27 DIAGNOSIS — N1831 Chronic kidney disease, stage 3a: Secondary | ICD-10-CM

## 2024-01-27 DIAGNOSIS — I5032 Chronic diastolic (congestive) heart failure: Secondary | ICD-10-CM

## 2024-01-27 DIAGNOSIS — J9621 Acute and chronic respiratory failure with hypoxia: Secondary | ICD-10-CM

## 2024-01-28 DIAGNOSIS — J9621 Acute and chronic respiratory failure with hypoxia: Secondary | ICD-10-CM

## 2024-01-28 DIAGNOSIS — J449 Chronic obstructive pulmonary disease, unspecified: Secondary | ICD-10-CM

## 2024-01-28 DIAGNOSIS — Z930 Tracheostomy status: Secondary | ICD-10-CM

## 2024-01-28 DIAGNOSIS — N179 Acute kidney failure, unspecified: Secondary | ICD-10-CM

## 2024-01-29 DIAGNOSIS — N179 Acute kidney failure, unspecified: Secondary | ICD-10-CM

## 2024-01-29 DIAGNOSIS — Z930 Tracheostomy status: Secondary | ICD-10-CM

## 2024-01-29 DIAGNOSIS — J9621 Acute and chronic respiratory failure with hypoxia: Secondary | ICD-10-CM

## 2024-01-29 DIAGNOSIS — J449 Chronic obstructive pulmonary disease, unspecified: Secondary | ICD-10-CM

## 2024-01-30 DIAGNOSIS — J449 Chronic obstructive pulmonary disease, unspecified: Secondary | ICD-10-CM

## 2024-01-30 DIAGNOSIS — N179 Acute kidney failure, unspecified: Secondary | ICD-10-CM

## 2024-01-30 DIAGNOSIS — J9621 Acute and chronic respiratory failure with hypoxia: Secondary | ICD-10-CM

## 2024-01-30 DIAGNOSIS — Z930 Tracheostomy status: Secondary | ICD-10-CM

## 2024-01-31 DIAGNOSIS — J9621 Acute and chronic respiratory failure with hypoxia: Secondary | ICD-10-CM

## 2024-01-31 DIAGNOSIS — J449 Chronic obstructive pulmonary disease, unspecified: Secondary | ICD-10-CM

## 2024-01-31 DIAGNOSIS — N179 Acute kidney failure, unspecified: Secondary | ICD-10-CM

## 2024-01-31 DIAGNOSIS — Z930 Tracheostomy status: Secondary | ICD-10-CM

## 2024-02-01 DIAGNOSIS — N179 Acute kidney failure, unspecified: Secondary | ICD-10-CM

## 2024-02-01 DIAGNOSIS — J9621 Acute and chronic respiratory failure with hypoxia: Secondary | ICD-10-CM

## 2024-02-01 DIAGNOSIS — J449 Chronic obstructive pulmonary disease, unspecified: Secondary | ICD-10-CM

## 2024-02-01 DIAGNOSIS — Z930 Tracheostomy status: Secondary | ICD-10-CM

## 2024-02-02 DIAGNOSIS — Z930 Tracheostomy status: Secondary | ICD-10-CM

## 2024-02-02 DIAGNOSIS — J9621 Acute and chronic respiratory failure with hypoxia: Secondary | ICD-10-CM

## 2024-02-02 DIAGNOSIS — J449 Chronic obstructive pulmonary disease, unspecified: Secondary | ICD-10-CM

## 2024-02-02 DIAGNOSIS — N179 Acute kidney failure, unspecified: Secondary | ICD-10-CM

## 2024-02-03 DIAGNOSIS — N1832 Chronic kidney disease, stage 3b: Secondary | ICD-10-CM

## 2024-02-03 DIAGNOSIS — I10 Essential (primary) hypertension: Secondary | ICD-10-CM

## 2024-02-03 DIAGNOSIS — J9621 Acute and chronic respiratory failure with hypoxia: Secondary | ICD-10-CM

## 2024-02-03 DIAGNOSIS — I5032 Chronic diastolic (congestive) heart failure: Secondary | ICD-10-CM

## 2024-02-07 ENCOUNTER — Encounter (HOSPITAL_COMMUNITY): Payer: Self-pay

## 2024-02-08 DIAGNOSIS — N1831 Chronic kidney disease, stage 3a: Secondary | ICD-10-CM

## 2024-02-08 DIAGNOSIS — J9621 Acute and chronic respiratory failure with hypoxia: Secondary | ICD-10-CM

## 2024-02-08 DIAGNOSIS — I10 Essential (primary) hypertension: Secondary | ICD-10-CM

## 2024-02-08 DIAGNOSIS — I5032 Chronic diastolic (congestive) heart failure: Secondary | ICD-10-CM

## 2024-02-10 DIAGNOSIS — N179 Acute kidney failure, unspecified: Secondary | ICD-10-CM

## 2024-02-10 DIAGNOSIS — I5032 Chronic diastolic (congestive) heart failure: Secondary | ICD-10-CM

## 2024-02-10 DIAGNOSIS — N1831 Chronic kidney disease, stage 3a: Secondary | ICD-10-CM

## 2024-02-10 DIAGNOSIS — J449 Chronic obstructive pulmonary disease, unspecified: Secondary | ICD-10-CM

## 2024-02-10 DIAGNOSIS — J9621 Acute and chronic respiratory failure with hypoxia: Secondary | ICD-10-CM

## 2024-02-10 DIAGNOSIS — Z930 Tracheostomy status: Secondary | ICD-10-CM

## 2024-02-11 DIAGNOSIS — J449 Chronic obstructive pulmonary disease, unspecified: Secondary | ICD-10-CM

## 2024-02-11 DIAGNOSIS — Z930 Tracheostomy status: Secondary | ICD-10-CM

## 2024-02-11 DIAGNOSIS — I5032 Chronic diastolic (congestive) heart failure: Secondary | ICD-10-CM

## 2024-02-11 DIAGNOSIS — N179 Acute kidney failure, unspecified: Secondary | ICD-10-CM

## 2024-02-11 DIAGNOSIS — J9621 Acute and chronic respiratory failure with hypoxia: Secondary | ICD-10-CM

## 2024-02-11 DIAGNOSIS — N1831 Chronic kidney disease, stage 3a: Secondary | ICD-10-CM

## 2024-02-12 DIAGNOSIS — J9621 Acute and chronic respiratory failure with hypoxia: Secondary | ICD-10-CM

## 2024-02-12 DIAGNOSIS — Z930 Tracheostomy status: Secondary | ICD-10-CM

## 2024-02-12 DIAGNOSIS — J449 Chronic obstructive pulmonary disease, unspecified: Secondary | ICD-10-CM

## 2024-02-12 DIAGNOSIS — N1831 Chronic kidney disease, stage 3a: Secondary | ICD-10-CM

## 2024-02-12 DIAGNOSIS — I5032 Chronic diastolic (congestive) heart failure: Secondary | ICD-10-CM

## 2024-02-12 DIAGNOSIS — N179 Acute kidney failure, unspecified: Secondary | ICD-10-CM

## 2024-02-13 DIAGNOSIS — Z930 Tracheostomy status: Secondary | ICD-10-CM

## 2024-02-13 DIAGNOSIS — J449 Chronic obstructive pulmonary disease, unspecified: Secondary | ICD-10-CM

## 2024-02-13 DIAGNOSIS — N179 Acute kidney failure, unspecified: Secondary | ICD-10-CM

## 2024-02-13 DIAGNOSIS — J9621 Acute and chronic respiratory failure with hypoxia: Secondary | ICD-10-CM

## 2024-02-13 DIAGNOSIS — I10 Essential (primary) hypertension: Secondary | ICD-10-CM

## 2024-02-13 DIAGNOSIS — I5032 Chronic diastolic (congestive) heart failure: Secondary | ICD-10-CM

## 2024-02-13 DIAGNOSIS — N1831 Chronic kidney disease, stage 3a: Secondary | ICD-10-CM

## 2024-02-14 DIAGNOSIS — N179 Acute kidney failure, unspecified: Secondary | ICD-10-CM

## 2024-02-14 DIAGNOSIS — J9621 Acute and chronic respiratory failure with hypoxia: Secondary | ICD-10-CM

## 2024-02-14 DIAGNOSIS — Z930 Tracheostomy status: Secondary | ICD-10-CM

## 2024-02-14 DIAGNOSIS — J449 Chronic obstructive pulmonary disease, unspecified: Secondary | ICD-10-CM

## 2024-02-15 DIAGNOSIS — N1831 Chronic kidney disease, stage 3a: Secondary | ICD-10-CM

## 2024-02-15 DIAGNOSIS — I5032 Chronic diastolic (congestive) heart failure: Secondary | ICD-10-CM

## 2024-02-15 DIAGNOSIS — J449 Chronic obstructive pulmonary disease, unspecified: Secondary | ICD-10-CM

## 2024-02-15 DIAGNOSIS — N179 Acute kidney failure, unspecified: Secondary | ICD-10-CM

## 2024-02-15 DIAGNOSIS — Z930 Tracheostomy status: Secondary | ICD-10-CM

## 2024-02-15 DIAGNOSIS — J9621 Acute and chronic respiratory failure with hypoxia: Secondary | ICD-10-CM

## 2024-02-16 DIAGNOSIS — N1831 Chronic kidney disease, stage 3a: Secondary | ICD-10-CM

## 2024-02-16 DIAGNOSIS — J9621 Acute and chronic respiratory failure with hypoxia: Secondary | ICD-10-CM

## 2024-02-16 DIAGNOSIS — I5032 Chronic diastolic (congestive) heart failure: Secondary | ICD-10-CM

## 2024-02-16 DIAGNOSIS — J449 Chronic obstructive pulmonary disease, unspecified: Secondary | ICD-10-CM

## 2024-02-16 DIAGNOSIS — Z930 Tracheostomy status: Secondary | ICD-10-CM

## 2024-02-16 DIAGNOSIS — N179 Acute kidney failure, unspecified: Secondary | ICD-10-CM

## 2024-02-17 DIAGNOSIS — J9621 Acute and chronic respiratory failure with hypoxia: Secondary | ICD-10-CM

## 2024-02-17 DIAGNOSIS — N1831 Chronic kidney disease, stage 3a: Secondary | ICD-10-CM

## 2024-02-17 DIAGNOSIS — I5032 Chronic diastolic (congestive) heart failure: Secondary | ICD-10-CM

## 2024-02-19 DIAGNOSIS — J9621 Acute and chronic respiratory failure with hypoxia: Secondary | ICD-10-CM

## 2024-02-19 DIAGNOSIS — N1831 Chronic kidney disease, stage 3a: Secondary | ICD-10-CM

## 2024-02-19 DIAGNOSIS — I5032 Chronic diastolic (congestive) heart failure: Secondary | ICD-10-CM

## 2024-02-20 DIAGNOSIS — N1831 Chronic kidney disease, stage 3a: Secondary | ICD-10-CM

## 2024-02-20 DIAGNOSIS — J9621 Acute and chronic respiratory failure with hypoxia: Secondary | ICD-10-CM

## 2024-02-20 DIAGNOSIS — I5032 Chronic diastolic (congestive) heart failure: Secondary | ICD-10-CM

## 2024-02-21 DIAGNOSIS — Z930 Tracheostomy status: Secondary | ICD-10-CM

## 2024-02-21 DIAGNOSIS — I5032 Chronic diastolic (congestive) heart failure: Secondary | ICD-10-CM

## 2024-02-21 DIAGNOSIS — J9621 Acute and chronic respiratory failure with hypoxia: Secondary | ICD-10-CM

## 2024-02-21 DIAGNOSIS — J449 Chronic obstructive pulmonary disease, unspecified: Secondary | ICD-10-CM

## 2024-02-21 DIAGNOSIS — N179 Acute kidney failure, unspecified: Secondary | ICD-10-CM

## 2024-02-21 DIAGNOSIS — N1831 Chronic kidney disease, stage 3a: Secondary | ICD-10-CM

## 2024-03-16 DEATH — deceased

## 2024-03-31 IMAGING — DX DG ABDOMEN 1V
1 series · 1 of 1 positions shown · non-contrast
Comparison: September 19, 2021.

CLINICAL DATA: Encounter for OG tube placement.

EXAM:
ABDOMEN - 1 VIEW

[abdomen supine]
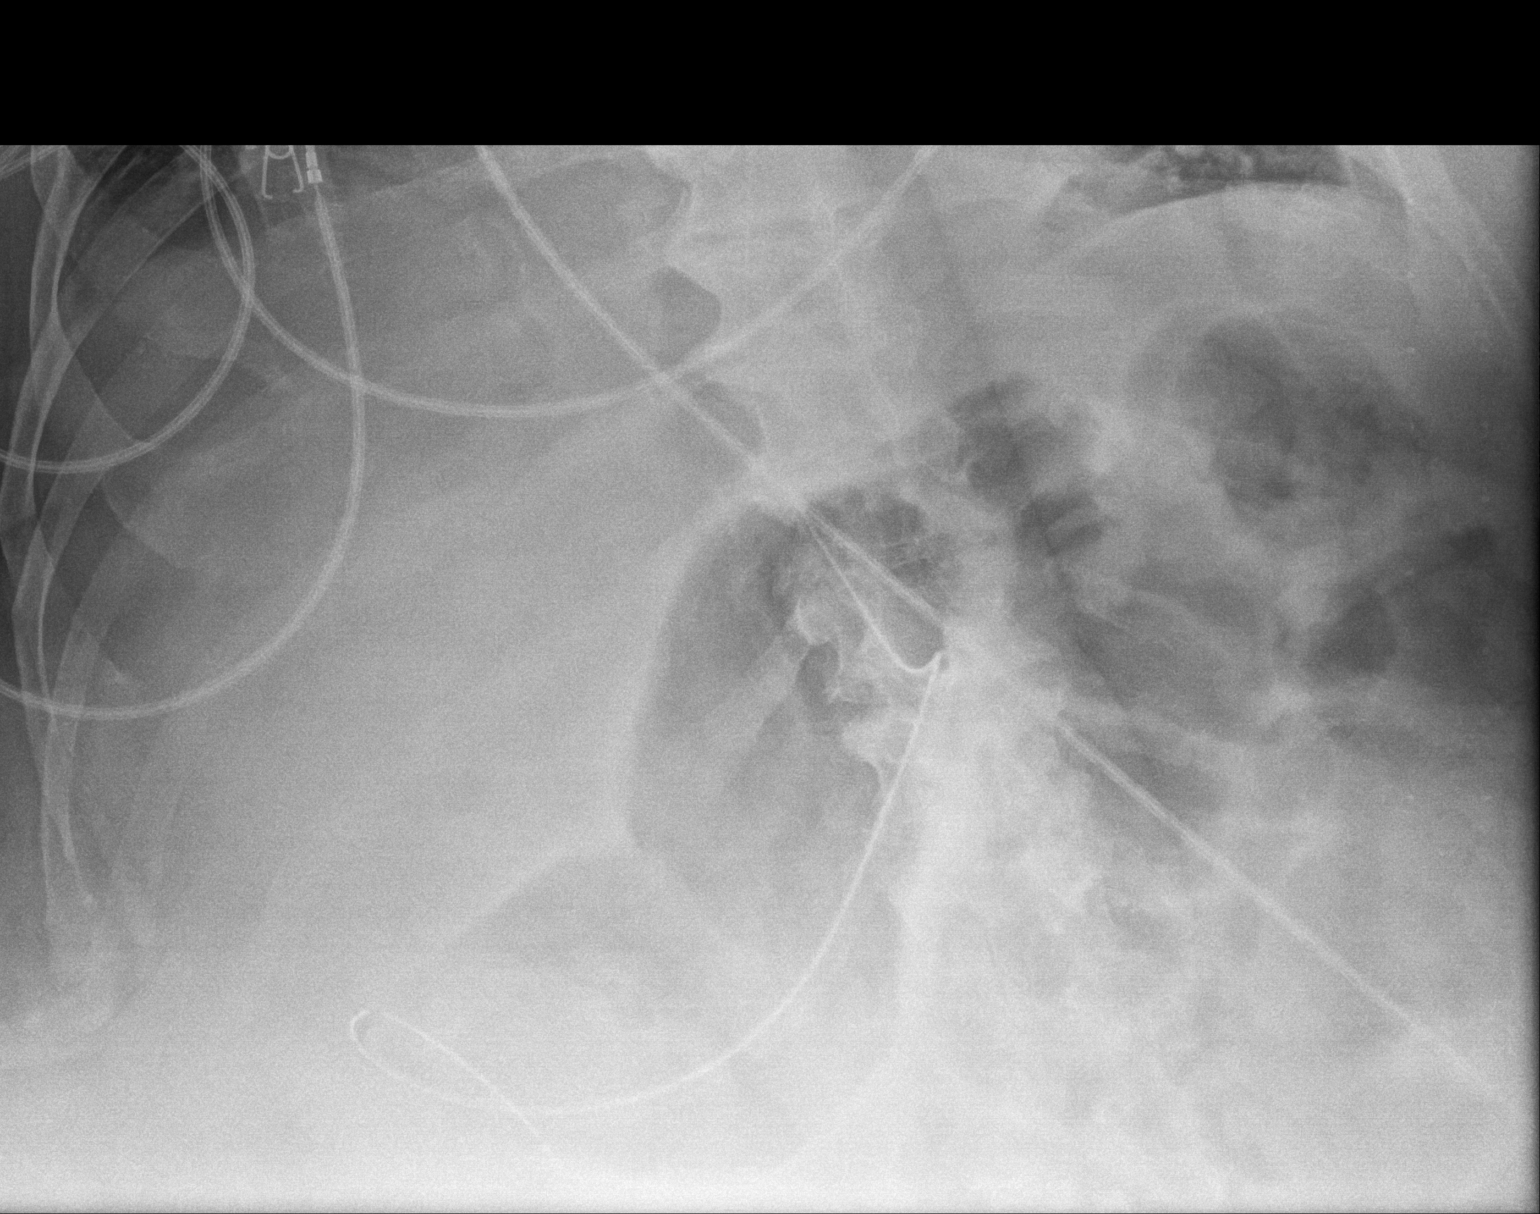

[1 of 1 positions shown; findings below may reference images not displayed]

FINDINGS: Limited imaging of the abdomen for tube placement includes mid
abdomen and upper abdomen only and excludes much of the LEFT flank.

Gastric tube is coiled in the stomach, tip in the distal stomach or
proximal duodenum, difficult to assessed currently. If gastric
placement is desired approximally 8-10 cm retraction may be helpful.

EKG leads project over the abdomen.

No acute skeletal process. Gaseous distension of LEFT upper quadrant
bowel loops.
IMPRESSION: Gastric tube is coiled in the stomach, tip in the distal stomach or
proximal duodenum, difficult to assessed currently. If gastric
placement is desired approximally 8-10 cm retraction may be helpful.

Gaseous distension of bowel loops in the LEFT abdomen not well
assessed perhaps increased from previous imaging.
